# Patient Record
Sex: Male | Born: 1935
Health system: Southern US, Community
[De-identification: ages and names within clinical notes are randomized; demographics above are authoritative.]

## PROBLEM LIST (undated history)

## (undated) DIAGNOSIS — I2089 Other forms of angina pectoris: Secondary | ICD-10-CM

## (undated) DIAGNOSIS — F329 Major depressive disorder, single episode, unspecified: Secondary | ICD-10-CM

## (undated) DIAGNOSIS — D62 Acute posthemorrhagic anemia: Secondary | ICD-10-CM

## (undated) DIAGNOSIS — T4145XA Adverse effect of unspecified anesthetic, initial encounter: Secondary | ICD-10-CM

## (undated) DIAGNOSIS — I639 Cerebral infarction, unspecified: Secondary | ICD-10-CM

## (undated) DIAGNOSIS — I749 Embolism and thrombosis of unspecified artery: Secondary | ICD-10-CM

## (undated) DIAGNOSIS — M1990 Unspecified osteoarthritis, unspecified site: Secondary | ICD-10-CM

## (undated) DIAGNOSIS — K219 Gastro-esophageal reflux disease without esophagitis: Secondary | ICD-10-CM

## (undated) DIAGNOSIS — Z8489 Family history of other specified conditions: Secondary | ICD-10-CM

## (undated) DIAGNOSIS — S065X9A Traumatic subdural hemorrhage with loss of consciousness of unspecified duration, initial encounter: Secondary | ICD-10-CM

## (undated) DIAGNOSIS — F419 Anxiety disorder, unspecified: Secondary | ICD-10-CM

## (undated) DIAGNOSIS — I219 Acute myocardial infarction, unspecified: Secondary | ICD-10-CM

## (undated) DIAGNOSIS — I208 Other forms of angina pectoris: Secondary | ICD-10-CM

## (undated) DIAGNOSIS — K759 Inflammatory liver disease, unspecified: Secondary | ICD-10-CM

## (undated) DIAGNOSIS — M069 Rheumatoid arthritis, unspecified: Secondary | ICD-10-CM

## (undated) DIAGNOSIS — T8859XA Other complications of anesthesia, initial encounter: Secondary | ICD-10-CM

## (undated) DIAGNOSIS — K2210 Ulcer of esophagus without bleeding: Secondary | ICD-10-CM

## (undated) DIAGNOSIS — N400 Enlarged prostate without lower urinary tract symptoms: Secondary | ICD-10-CM

## (undated) DIAGNOSIS — R51 Headache: Secondary | ICD-10-CM

## (undated) DIAGNOSIS — E785 Hyperlipidemia, unspecified: Secondary | ICD-10-CM

## (undated) DIAGNOSIS — I739 Peripheral vascular disease, unspecified: Secondary | ICD-10-CM

## (undated) DIAGNOSIS — I10 Essential (primary) hypertension: Secondary | ICD-10-CM

## (undated) DIAGNOSIS — J189 Pneumonia, unspecified organism: Secondary | ICD-10-CM

## (undated) DIAGNOSIS — F32A Depression, unspecified: Secondary | ICD-10-CM

## (undated) DIAGNOSIS — G8929 Other chronic pain: Secondary | ICD-10-CM

## (undated) DIAGNOSIS — L03112 Cellulitis of left axilla: Secondary | ICD-10-CM

## (undated) DIAGNOSIS — I959 Hypotension, unspecified: Secondary | ICD-10-CM

## (undated) DIAGNOSIS — I2510 Atherosclerotic heart disease of native coronary artery without angina pectoris: Secondary | ICD-10-CM

## (undated) DIAGNOSIS — R519 Headache, unspecified: Secondary | ICD-10-CM

## (undated) DIAGNOSIS — C3491 Malignant neoplasm of unspecified part of right bronchus or lung: Secondary | ICD-10-CM

## (undated) DIAGNOSIS — M545 Low back pain, unspecified: Secondary | ICD-10-CM

## (undated) DIAGNOSIS — S065XAA Traumatic subdural hemorrhage with loss of consciousness status unknown, initial encounter: Secondary | ICD-10-CM

## (undated) DIAGNOSIS — Z8679 Personal history of other diseases of the circulatory system: Secondary | ICD-10-CM

## (undated) HISTORY — DX: Other forms of angina pectoris: I20.89

## (undated) HISTORY — DX: Hemochromatosis, unspecified: E83.119

## (undated) HISTORY — DX: Anxiety disorder, unspecified: F41.9

## (undated) HISTORY — DX: Other forms of angina pectoris: I20.8

## (undated) HISTORY — PX: NASAL SINUS SURGERY: SHX719

## (undated) HISTORY — PX: INGUINAL HERNIA REPAIR: SUR1180

## (undated) HISTORY — PX: CORONARY ANGIOPLASTY WITH STENT PLACEMENT: SHX49

## (undated) HISTORY — DX: Unspecified osteoarthritis, unspecified site: M19.90

## (undated) HISTORY — DX: Personal history of other diseases of the circulatory system: Z86.79

## (undated) HISTORY — PX: TOTAL KNEE ARTHROPLASTY: SHX125

## (undated) HISTORY — DX: Traumatic subdural hemorrhage with loss of consciousness of unspecified duration, initial encounter: S06.5X9A

## (undated) HISTORY — DX: Ulcer of esophagus without bleeding: K22.10

## (undated) HISTORY — DX: Traumatic subdural hemorrhage with loss of consciousness status unknown, initial encounter: S06.5XAA

## (undated) HISTORY — PX: DESCENDING AORTIC ANEURYSM REPAIR W/ STENT: SHX1456

## (undated) HISTORY — DX: Hyperlipidemia, unspecified: E78.5

## (undated) HISTORY — DX: Atherosclerotic heart disease of native coronary artery without angina pectoris: I25.10

## (undated) HISTORY — DX: Peripheral vascular disease, unspecified: I73.9

## (undated) HISTORY — DX: Benign prostatic hyperplasia without lower urinary tract symptoms: N40.0

## (undated) HISTORY — PX: CHOLECYSTECTOMY OPEN: SUR202

## (undated) HISTORY — PX: JOINT REPLACEMENT: SHX530

## (undated) HISTORY — PX: SHOULDER OPEN ROTATOR CUFF REPAIR: SHX2407

## (undated) HISTORY — DX: Gastro-esophageal reflux disease without esophagitis: K21.9

## (undated) HISTORY — PX: CATARACT EXTRACTION W/ INTRAOCULAR LENS  IMPLANT, BILATERAL: SHX1307

## (undated) HISTORY — DX: Essential (primary) hypertension: I10

## (undated) HISTORY — DX: Malignant neoplasm of unspecified part of right bronchus or lung: C34.91

## (undated) HISTORY — PX: CORNEAL TRANSPLANT: SHX108

---

## 1997-04-04 HISTORY — PX: CORONARY ARTERY BYPASS GRAFT: SHX141

## 1997-11-19 ENCOUNTER — Inpatient Hospital Stay (HOSPITAL_COMMUNITY): Admission: AD | Admit: 1997-11-19 | Discharge: 1997-11-21 | Payer: Self-pay | Admitting: *Deleted

## 1997-11-19 ENCOUNTER — Encounter: Payer: Self-pay | Admitting: *Deleted

## 1999-12-29 ENCOUNTER — Encounter: Payer: Self-pay | Admitting: Emergency Medicine

## 1999-12-29 ENCOUNTER — Inpatient Hospital Stay (HOSPITAL_COMMUNITY): Admission: EM | Admit: 1999-12-29 | Discharge: 1999-12-31 | Payer: Self-pay | Admitting: Emergency Medicine

## 1999-12-30 ENCOUNTER — Encounter: Payer: Self-pay | Admitting: Cardiology

## 1999-12-31 ENCOUNTER — Encounter: Payer: Self-pay | Admitting: Cardiology

## 2000-03-06 ENCOUNTER — Inpatient Hospital Stay (HOSPITAL_COMMUNITY): Admission: RE | Admit: 2000-03-06 | Discharge: 2000-03-11 | Payer: Self-pay | Admitting: Orthopedic Surgery

## 2000-11-08 ENCOUNTER — Encounter: Admission: RE | Admit: 2000-11-08 | Discharge: 2000-11-08 | Payer: Self-pay | Admitting: Family Medicine

## 2000-11-08 ENCOUNTER — Encounter: Payer: Self-pay | Admitting: Family Medicine

## 2001-05-09 ENCOUNTER — Encounter: Payer: Self-pay | Admitting: Emergency Medicine

## 2001-05-09 ENCOUNTER — Inpatient Hospital Stay (HOSPITAL_COMMUNITY): Admission: EM | Admit: 2001-05-09 | Discharge: 2001-05-11 | Payer: Self-pay | Admitting: Emergency Medicine

## 2003-10-20 ENCOUNTER — Inpatient Hospital Stay (HOSPITAL_COMMUNITY): Admission: RE | Admit: 2003-10-20 | Discharge: 2003-10-22 | Payer: Self-pay | Admitting: Orthopedic Surgery

## 2004-02-09 ENCOUNTER — Emergency Department (HOSPITAL_COMMUNITY): Admission: EM | Admit: 2004-02-09 | Discharge: 2004-02-09 | Payer: Self-pay | Admitting: Family Medicine

## 2004-02-13 ENCOUNTER — Emergency Department (HOSPITAL_COMMUNITY): Admission: EM | Admit: 2004-02-13 | Discharge: 2004-02-13 | Payer: Self-pay | Admitting: Family Medicine

## 2004-02-16 ENCOUNTER — Emergency Department (HOSPITAL_COMMUNITY): Admission: EM | Admit: 2004-02-16 | Discharge: 2004-02-16 | Payer: Self-pay | Admitting: Family Medicine

## 2004-03-04 LAB — HM COLONOSCOPY

## 2004-05-20 ENCOUNTER — Other Ambulatory Visit: Admission: RE | Admit: 2004-05-20 | Discharge: 2004-05-20 | Payer: Self-pay | Admitting: Dermatology

## 2004-06-21 ENCOUNTER — Other Ambulatory Visit: Admission: RE | Admit: 2004-06-21 | Discharge: 2004-06-21 | Payer: Self-pay | Admitting: Dermatology

## 2005-03-29 ENCOUNTER — Ambulatory Visit (HOSPITAL_COMMUNITY)
Admission: RE | Admit: 2005-03-29 | Discharge: 2005-03-29 | Payer: Self-pay | Admitting: Physical Medicine & Rehabilitation

## 2005-05-03 ENCOUNTER — Inpatient Hospital Stay (HOSPITAL_COMMUNITY): Admission: EM | Admit: 2005-05-03 | Discharge: 2005-05-10 | Payer: Self-pay | Admitting: Emergency Medicine

## 2005-05-04 ENCOUNTER — Ambulatory Visit: Payer: Self-pay | Admitting: Pulmonary Disease

## 2005-05-07 ENCOUNTER — Encounter (INDEPENDENT_AMBULATORY_CARE_PROVIDER_SITE_OTHER): Payer: Self-pay | Admitting: Specialist

## 2005-08-03 ENCOUNTER — Ambulatory Visit (HOSPITAL_COMMUNITY): Admission: RE | Admit: 2005-08-03 | Discharge: 2005-08-03 | Payer: Self-pay | Admitting: Family Medicine

## 2006-02-01 ENCOUNTER — Inpatient Hospital Stay (HOSPITAL_COMMUNITY): Admission: RE | Admit: 2006-02-01 | Discharge: 2006-02-07 | Payer: Self-pay | Admitting: Orthopedic Surgery

## 2007-02-16 ENCOUNTER — Observation Stay (HOSPITAL_COMMUNITY): Admission: RE | Admit: 2007-02-16 | Discharge: 2007-02-17 | Payer: Self-pay | Admitting: Cardiology

## 2007-05-11 ENCOUNTER — Ambulatory Visit (HOSPITAL_COMMUNITY): Admission: RE | Admit: 2007-05-11 | Discharge: 2007-05-11 | Payer: Self-pay | Admitting: Cardiology

## 2009-06-16 ENCOUNTER — Ambulatory Visit: Payer: Self-pay

## 2009-06-16 ENCOUNTER — Encounter: Payer: Self-pay | Admitting: Cardiovascular Disease

## 2009-06-16 DIAGNOSIS — I739 Peripheral vascular disease, unspecified: Secondary | ICD-10-CM | POA: Insufficient documentation

## 2009-06-17 ENCOUNTER — Telehealth (INDEPENDENT_AMBULATORY_CARE_PROVIDER_SITE_OTHER): Payer: Self-pay | Admitting: *Deleted

## 2009-09-18 ENCOUNTER — Encounter: Admission: RE | Admit: 2009-09-18 | Discharge: 2009-09-18 | Payer: Self-pay | Admitting: Cardiology

## 2009-09-21 ENCOUNTER — Encounter: Admission: RE | Admit: 2009-09-21 | Discharge: 2009-09-21 | Payer: Self-pay | Admitting: Family Medicine

## 2010-03-22 ENCOUNTER — Ambulatory Visit: Payer: Self-pay | Admitting: Internal Medicine

## 2010-03-25 ENCOUNTER — Ambulatory Visit: Payer: Self-pay | Admitting: Internal Medicine

## 2010-03-26 ENCOUNTER — Ambulatory Visit: Payer: Self-pay

## 2010-03-30 ENCOUNTER — Ambulatory Visit: Payer: Self-pay

## 2010-05-04 NOTE — Progress Notes (Signed)
  Faxed Doppler over to [facility] @ [facility]. to fax [phone] [facility]  June 17, 2009 3:08 PM

## 2010-05-04 NOTE — Miscellaneous (Signed)
Summary: Orders Update  Clinical Lists Changes  Problems: Added new problem of UNSPECIFIED PERIPHERAL VASCULAR DISEASE (ICD-443.9) Orders: Added new Test order of Arterial Duplex Lower Extremity (Arterial Duplex Low) - Signed 

## 2010-08-17 NOTE — Cardiovascular Report (Signed)
NAME:  Evan [name], Evan [name] NO.:  [id number]   MEDICAL RECORD NO.:  [id number]          PATIENT TYPE:  OIB   LOCATION:  2899                         FACILITY:  [facility]   PHYSICIAN:  Francisca December, M.D.  DATE OF BIRTH:  16-Oct-1927   DATE OF PROCEDURE:  05/11/2007  DATE OF DISCHARGE:  05/11/2007                            CARDIAC CATHETERIZATION   PROCEDURES PERFORMED:  1. Left heart catheterization.  2. Left ventriculography.  3. Coronary angiography.  4. Arterial conduit angiography both right radial free graft to OM and      LIMA graft.   INDICATION:  [name] is a 77 year old man with extensive history  of ASCVD.  He is status post a remote CABG which included a right radial  free graft to the OM and a LIMA to the LAD.  He underwent stent  implantation in an occluded circumflex which extended into two large  obtuse marginals in November 2008.  He also had a stent implantation of  the ostium of the right radial free graft.  These were both drug-eluting  stents.  He has had persistent chest pain in an atypical anginal  fashion.  Because of the persistence of his symptoms he is returned to  the catheterization laboratory at this time to identify possible early  stent failure or other progressive disease.   PROCEDURAL NOTE:  The patient is brought to the cardiac catheterization  laboratory in the fasting state.  The right groin was prepped and draped  in the usual sterile fashion.  Local anesthesia was obtained with  infiltration with 1% lidocaine.  A 5-French catheter sheath was inserted  percutaneously into the right femoral artery utilizing an anterior  approach over a guiding J-wire.  Left heart catheterization and coronary  angiography then proceeded in the standard fashion using 5-French #4  left and right Judkins catheters and a 110-cm pigtail catheter.  A 30-  degree RAO cine left ventriculogram was performed utilizing a power  injector; 42 mL were  injected at 13 mL per second.  The right coronary  catheter was used to perform cineangiography of the right radial free  graft to the obtuse marginal.  It was then used to manipulate into the  left subclavian artery.  A LIMA catheter was then placed over a long  guiding J-wire.  Cineangiography of the LIMA to the LAD was conducted in  LAO and RAO projections.  At the completion of the procedure the  catheter and catheter sheath were removed.  Hemostasis was achieved by  direct pressure.  The patient was transported to the recovery area in  stable condition with an intact distal pulse.   HEMODYNAMIC RESULTS:  Systemic arterial pressure was 134/71 with a mean  of 96 mmHg.  There was no systolic gradient across the aortic valve.  The left ventricular end-diastolic pressure was 6 mmHg pre  ventriculogram.   ANGIOGRAPHY:  The left ventriculogram demonstrated normal chamber size  and normal global systolic function.  There is actually hyperdynamic  systolic function with a visually estimated ejection fraction of 75-80%.  There  is diastolic mitral regurgitation noted.  There is a normal  trileaflet aortic valve.   There is a right-dominant coronary system present.  The main left  coronary artery is short and normal.   The left anterior descending artery is completely occluded at its  origin.   The left circumflex coronary artery gives rise to a small first marginal  branch and then there is the stented segment in the mid portion which is  widely patent.  The mid to distal portion of the left circumflex gives  rise to a large obtuse marginal that has a 30-40% stenosis at its  origin.  The circumflex then goes on to give rise to two very small  posterobasal branches.  There are no significant obstructions seen  within the circumflex at its distal marginal branches.  The first  marginal does have a diffuse 80% stenosis and retrograde flow into the  graft is noted.   The right coronary  artery is large and dominant.  There are luminal  irregularities and a tubular 20-30% stenosis is seen in the mid portion.  The distal vessel gives rise to a large posterior descending artery and  a moderate-sized posterolateral segment with a single left ventricular  branch which is moderate in size.  There are no obstructions in the  distal right coronary artery or its distal branches.   The right radial free graft to the marginal is widely patent including  its ostium which is the stented segment.  It inserts upon the first  marginal branch as noted above.  Provides retrograde and antegrade  filling.  Antegrade filling shows no obstruction.  Retrograde filling  shows the previously mentioned diffuse 80% stenosis.  There are some  luminal irregularities in the graft itself, but no significant  obstructions.   The LIMA graft to the LAD is widely patent and inserts upon the mid  portion of the anterior descending artery.  It provides retrograde and  antegrade filling.  Retrograde filling approaches the left main and  fills three septal perforators, the first and second of which are very  small, the third of which is moderate in size.  I can see no  obstructions in the retrograde portion that are of significance.  The  antegrade portion bifurcates on the mid to distal portion of the  anterior wall and both branches reach but do not quite traverse the  apex.  There are no obstructions in the ongoing or antegrade portion of  the left anterior descending.   Collateral vessels are not seen.   FINAL IMPRESSION:  1. Atherosclerotic coronary vascular disease, two-vessel.  2. Hyperdynamic left ventricular systolic function, ejection fraction      75-80%.  3. Widely patent stented sites in the left circumflex and ostium of      the right radial free graft to the obtuse marginal.  4. No clear coronary angiographic etiology for the patient's chest      pain.   PLAN:  Continue medical  therapy      Francisca December, M.D.  Electronically Signed     JHE/MEDQ  D:  05/11/2007  T:  05/13/2007  Job:  [id number]   cc:   Ernestina Penna, M.D.

## 2010-08-17 NOTE — Cardiovascular Report (Signed)
NAME:  Evan, [name] NO.:  [id number]   MEDICAL RECORD NO.:  [id number]          PATIENT TYPE:  OIB   LOCATION:  6531                         FACILITY:  [facility]   PHYSICIAN:  Francisca December, M.D.  DATE OF BIRTH:  08-14-1927   DATE OF PROCEDURE:  DATE OF DISCHARGE:                            CARDIAC CATHETERIZATION   PROCEDURE PERFORMED:  1. PCI/drug-eluting stent implantation, circumflex marginal.  2. PCI/drug-eluting stent implantation, ostium, right radial free      graft to OM1   INDICATIONS:  [name] is a 77 year old man who is now  approximately 10 years s/p CABG.  He has developed a recent atypical  anginal syndrome.  Myocardial perfusion study showed a significant  reversible inferolateral defect.  He has undergone coronary angiography  revealing a completely occluded second marginal branch which is new  since angiography performed in 2007.  As well, there is a significant or  subtotal stenosis at the origin of the right radial free graft to the  marginal branch #1.  He is to undergo catheter-based revascularization  at this time.   PROCEDURAL NOTE:  The patient is brought to the cardiac catheterization  lab during the fasting state.  The right groin was prepped and draped in  the usual sterile fashion.  Local anesthesia was obtained with  infiltration of 1% lidocaine; a 7 French catheter sheath was inserted  percutaneously into the right femoral artery utilizing an anterior  approach over guiding J wire; a 7 [place] #3.5 and then a 4.0 Voda left  guiding catheters were advanced to the ascending aorta but these did not  seat well in the left main.  I could in fact not cannulate the left main  with the Voda #4.  Therefore, a 3.5 CLS guiding catheter was advanced  where the left coronary os was adequately engaged.  The patient received  1.25 mg/kg of Bivalirudin as a bolus done in a constant infusion at 0.75  mg/kg per hour.  The resultant ACT was  354 seconds.  He had previously  been pretreated with Plavix; a 0.014-inch ASAHI medium wire was then  used to cross the complete occlusion in the second marginal branch; a  2.0/20 mm Maverick intracoronary balloon was then advanced across the  lesion and inflated 6 atmospheres for 120 seconds.  This did reestablish  antegrade flow in the vessel.  The Maverick balloon was removed and a 3  0/28 mm SCIMED Taxus Express 2 intracoronary drug-eluting stent was then  advanced carefully into place within this marginal branch, carefully  positioned, and deployed at a peak pressure of 14 atmospheres for not  greater than 35 seconds.  The stent balloon was deflated and removed,  and post dilatation performed with a 3.25/20--mm Quantum Maverick  intracoronary balloon.  This was placed in three different positions  within the stented segment and inflated to a peak pressure of 18  atmospheres for not greater than 40 seconds.  These maneuvers resulted  in wide patency as confirmed in orthogonal views, both with and without  the guidewire in place.  The guiding catheter was then removed and it  was replaced with a 6 French LCB guiding catheter.  The ostium of the  right radial free graft was cannulated, and the occlusion easily crossed  with a 0.014-inche Luge intracoronary guidewire.  Initial balloon  dilatation was performed with a 2.515 mm Scimed Maverick intracoronary  balloon.  This was placed across the lesion and inflated to 6  atmospheres for 20 seconds.  It was deflated and removed, and a 2.75/12-  mm SCIMED Taxus Express 2 intracoronary drug-eluting stent was then  advanced into place and carefully positioned in the ostium of the radial  free graft.  It was subsequently deployed at peak pressure of 12  atmospheres for 62 seconds.  The stent balloon was deflated and removed,  and a wide patency was confirmed in the ostium.  However, there was some  spasm at the distal end of the stented segment.   It was resistant to  i.c. nitroglycerin and i.c. verapamil.  Therefore, the 2.5/15-mm SCIMED  Maverick was advanced across this region just at the distal edge of the  stent and inflated to not greater than 4 atmospheres for 31 seconds.  It  was deflated and removed, and wide patency was achieved throughout the  stented segment and into the graft itself.  This was confirmed in  orthogonal views, both with and without the guidewire in place.  The  guiding catheter was then removed.  The sheath was sutured into place,  and the patient was transported to the recovery area in stable  condition.   ANGIOGRAPHY:  As mentioned, the initial lesion treated within the  circumflex extending into the second marginal branch.  It was completely  occluded and greater than 20 mm in length.  Following balloon dilatation  and stent implantation, there was no residual stenosis.  The second  lesion treated, as mentioned, was in the ostium of the right radial free  graft to marginal #1.  It was 90-95% stenotic and, following balloon  dilatation and stent implantation, there was again no residual stenosis.   FINAL IMPRESSION:  1. Atherosclerotic cardiovascular disease, three-vessel.  2. Status post successful PCI/drug-eluting stent implantation, second      marginal branch complete occlusion, and ostium of the right radial      free graft to first marginal branch.  3. Typical angina was not reproduced with device insertion and balloon      inflation      Francisca December, M.D.  Electronically Signed     JHE/MEDQ  D:  02/16/2007  T:  02/17/2007  Job:  [id number]   cc:   Ernestina Penna, M.D.

## 2010-08-20 NOTE — H&P (Signed)
[place]  Patient:    ELDRIGE, PITKIN                         MRN: [id number] Adm. Date:  [id number] Attending:  Corliss Marcus CC:         Tomi Bamberger, Olena Leatherwood  Francisca December, M.D.   History and Physical  CHIEF COMPLAINT:  Chest pain.  HISTORY:  This is a 77 year old married Caucasian gentleman admitted with left chest discomfort.  The pain has been intermittent for several days and worse today.  He has also had an upper respiratory infection for the past several days and has been under treatment by Tomi Bamberger at [facility].  Yesterday he was seen there and was given an intramuscular injection of an antibiotic and today he went back, and was given a prescription for Biaxin and he took his first dose today.  Of note is the fact that the patient does have known coronary artery disease.  He had coronary artery bypass graft surgery in [facility] in 1998.  He was admitted to [facility] in August 1999 with atypical chest pain and had cardiac catheterization.  The ejection fraction was normal.  The LAD was occluded at the ostium.  The left circumflex had a 80-90% narrowing of the obtuse marginal-1 and the LIMA to the LAD was widely patent.  The graft to the obtuse marginal-1 was patent, but with a 30-40% ostial obstruction, but the branch of the OM-2 was not felt.  The patient was discharged on medication and Imdur and Norvasc were added to his previous medication, which was atenolol.  An outpatient nuclear stress test was to have been performed; results not available.  The patient developed increased chest pain today and became concerned, and called EMS who transported him to [facility]  MEDICATIONS:  His present medications are Biaxin 500 mg 2 daily, Norvasc 5 mg daily, Ambien 10 mg H.S. p.r.n. for sleep, atenolol 50 mg daily, Ecotrin 81 mg daily, Ultram 50 mg q-8-h p.r.n. for pain, Imdur 30 mg daily, Lipitor 10  mg daily, and Voltaren XR 100 mg daily.  FAMILY HISTORY:  Strongly positive for coronary disease.  He had one brother who died at age 75 of a heart attack and one brother died after coronary bypass surgery.  He has three sisters, two of whom have had coronary disease. High blood pressure also runs in the family.  The patients father died of a heart attack at age 3 and mother died at age 13 of a heart attack.  SOCIAL HISTORY:  He is married.  He is retired from [employer].   He has five children; living and well.  He is a nonsmoker.  He has had no alcohol for 20 years.  ALLERGIES:  He is allergic to [facility].  REVIEW OF SYSTEMS:  He has not been experiencing any recent gastrointestinal problems.  He has had a recent respiratory cough.  He does have chronic nocturia once or twice a night.  He is not diabetic.  He does have orthopedic problems with arthritis of the right knee and has been advised by Dr. Trellis Paganini to have total knee replacement.  PHYSICAL EXAMINATION:  VITAL SIGNS:  On physical examination blood pressure is 122/67, pulse is 64 and regular and respirations are normal.  HEENT:  Unremarkable.  NECK:  Carotids are normal.  Jugular venous pressure normal.  CHEST:  Clear to auscultation without  rales.  HEART:  Normal first and second heart sounds.  There is no S3 or S4 gallop. There is no murmur, click or rub.  ABDOMEN:  Soft and nontender.  EXTREMITIES:  Show pedal pulses present.  No phlebitis or edema.  LABORATORY DATA:  The EKG shows normal sinus rhythm with no acute change.  Chest x-ray shows borderline cardiomegaly and possible early vascular congestion, but no pneumonia.  Laboratory studies show a white count of 11,300 and hemoglobin 12.7.  CK MB 2.1.  Troponin 0.04.  DIAGNOSTIC IMPRESSION: 1. Chest pain, rule out myocardial infarction. 2. Status post coronary artery bypass graft. 3. Recent upper respiratory infection, presently on Biaxin. 4. History of  hyperlipidemia on Lipitor.  DISPOSITION:  We are going to admit to a monitored bed.  Begin an IV heparin drip per pharmacy protocol.  We will hold his breakfast in the A.M. and consider adenosine Cardiolite stress test or possible cardiac cath. Dr. Amil Amen will see patient in A.M. DD:  12/29/99 TD:  12/30/99 Job: [id number]

## 2010-08-20 NOTE — H&P (Signed)
NAME:  Evan [name], Evan [name] NO.:  [id number]   MEDICAL RECORD NO.:  [id number]                   PATIENT TYPE:  INP   LOCATION:  NA                                   FACILITY:  [facility]   PHYSICIAN:  Maisie Fus B. Dixon, P.A.               DATE OF BIRTH:  1927-05-14   DATE OF ADMISSION:  DATE OF DISCHARGE:                                HISTORY & PHYSICAL   HISTORY OF PRESENT ILLNESS:  [name] is a male coming in complaining about  right shoulder pain that has been going on ever since May 2005, after he  sustained a fall and an injury to his right shoulder.  The patient had been  seen in our office x2 with a MRI showing a large rotator cuff tear and also  some mild degeneration of his AC joint.  Thus, he has elected to have a  rotator cuff repair and also an open DCR.   PAST MEDICAL HISTORY:  1. Coronary artery disease.  2. GERD.  3. Hypertension.   ALLERGIES:  FELDENE.   MEDICATIONS:  1. Darvocet-N 100 q.4-6h. p.r.n. pain.  2. Plavix 75 mg daily.  3. Atenolol 50 mg daily.  4. Altace 5 mg daily.  5. Wellbutrin XL 150 mg daily.  6. Aspirin 81 mg daily.  7. Prevacid 30 mg daily.  8. Lipitor 10 mg daily.  9. Clonazepam 0.5 mg t.i.d.  10.      Isosorbide mononitrate 60 mg 1-1/2 tabs daily.  11.      Diclofenac 50 mg t.i.d.   SOCIAL HISTORY:  The patient does not smoke.  He does not use any alcohol.  He is retired.  His primary care physician is Dr. Saul Fordyce.   PHYSICAL EXAMINATION:  VITAL SIGNS:  Pulse is 80 beats per minute,  respirations 18, blood pressure is 130/80.  GENERAL APPEARANCE:  The patient is a healthy-appearing male in no acute  distress.  Pleasant mood and affect.  Alert and oriented x3.  NECK AND HEAD:  Cranial nerves II-XII grossly intact.  He has full range of  motion without any difficulty.  He does have some mild tenderness over the  paraspinous muscles of C6 and C7, but no tenderness over on the left-hand  side.  Spurling's  test and ______________ test are negative x2.  CHEST:  Bilateral lung sounds are clear and equal.  No wheezes, rhonchi, or  rales noted.  HEART:  Regular rate and rhythm.  No murmur or gallop.  No S3 or S4.  ABDOMEN:  Bowel sounds are active in all four quadrants, nontender,  nondistended, no pulsatile masses noted.  EXTREMITIES:  Strength is 5/5 bilaterally with grip strengths, biceps, and  triceps, but strength of the internal and external rotators of the right  shoulder are 3/5.  Strength of quadriceps, hamstrings, plantar, and  dorsiflexion are all 5/5 bilaterally.  SKIN:  No rashes are noted.  NEUROLOGIC:  Intact bilateral upper and lower extremities.  Reflexes are 2+  and equal bilaterally.   LABORATORY DATA:  MRI shows a complete tear of the supraspinatus and rotator  cuff.  Plain x-ray showed some mild AC degeneration.   IMPRESSION:  Right rotator cuff tear.   PLAN:  Right shoulder arthroscopy, arthroscopy assisted subacromial  decompression, miny open rotator cuff repair, and open distal clavicle  resection.  This will be completed on October 20, 2003, at [facility].  Attending surgeon will be Dr. Malon Kindle.                                                Thomas B. Ferne Coe.    TBD/MEDQ  D:  10/07/2003  T:  10/07/2003  Job:  [id number]

## 2010-08-20 NOTE — Op Note (Signed)
NAME:  Evan [name], CANNER NO.:  [id number]   MEDICAL RECORD NO.:  [id number]                   PATIENT TYPE:  INP   LOCATION:  5029                                 FACILITY:  [facility]   PHYSICIAN:  Almedia Balls. Ranell Patrick, M.D.              DATE OF BIRTH:  25-Sep-1927   DATE OF PROCEDURE:  10/20/2003  DATE OF DISCHARGE:  10/22/2003                                 OPERATIVE REPORT   PREOPERATIVE DIAGNOSIS:  Right shoulder rotator cuff tear and  acromioclavicular joint arthritis.   POSTOPERATIVE DIAGNOSIS:  Right shoulder rotator cuff tear, massive and  unrepairable, chronic impingement, superior labral tear anterior posterior  with unstable biceps anchor, and acromioclavicular joint arthritis.   OPERATION PERFORMED:  Right shoulder arthroscopy with extensive intra-  articular debridement of rotator cuff tear of superior labral tear, anterior  posterior as well as arthroscopic biceps tenotomy followed by arthroscopic  subacromial decompression, open biceps tenodesis in the bicipital groove and  open distal clavicle excision.   SURGEON:  Almedia Balls. Ranell Patrick, M.D.   ASSISTANT:  Donnie Coffin. Durwin Nora, P.A.   ANESTHESIA:  General.   ESTIMATED BLOOD LOSS:  Minimal.   FLUIDS REPLACED:  1200 mL crystalloid.   INSTRUMENT COUNT:  Correct.   COMPLICATIONS:  None.   ANTIBIOTICS:  Perioperative antibiotics given.   INDICATIONS FOR PROCEDURE:  The patient is a 77 year old male who presents  complaining of right shoulder pain. The patient fell recently, complained of  severe pain in the shoulder, decreased function, decreased range of motion.  The patient presented to orthopedics where evaluation demonstrated a large  retracted rotator cuff tear.  Concern was whether this was acute or chronic.  Patient's pain, however, was greatly different from what it had been pre-  injury and based on his decline in function and his desire to return to  functional activities.  The patient  elected to proceed with surgical  treatment with debridement or repair of the rotator cuff as well as  addressing acromioclavicular joint arthritis noted on preoperative work-up.  Informed consent was obtained.  The patient was cleared preoperatively  medically and scheduled for surgery at [facility].   DESCRIPTION OF PROCEDURE:  After an adequate level of anesthesia was  achieved and preoperative antibiotics were given, the patient was positioned  in modified beach chair position.  The right shoulder was examined under  anesthesia and there was noted to be forward elevation stiffness indicating  chronicity in the patient's injury.  Forward elevation limited to about 140  degrees.  Abduction 90 degrees, external rotation  was 45 to 50 degrees,  internal rotation 30.  A very gentle manipulation was attempted; however,  this did not yield any significant improvement in the patient's forward  elevation and this was felt to be chronic capsular tightness and the  concern, with the patient 81, was for possible fracture if progressed at  all.  We elected to proceed with arthroscopy at this point. After sterile  prep and drape of the shoulder, standard arthroscopic portals were utilized,  anterolateral and posterior portals.  Infiltration of the skin with 0.25%  Marcaine with epinephrine followed by 10-11 blade scalpel introduction  cannula to joint using blunt obturators.  Diagnostic arthroscopy revealed  significant anterior and superior labral fraying extending to the 11 o'clock  position on the right shoulder posteriorly.  Once the loose labral tissue  was debrided, there was noted to be significant instability in the biceps  anchor as well as splitting and fraying of the biceps tendon.  Based on the  condition of the anchor and the biceps, it was decided to perform a biceps  tenotomy to prevent chronic pain from this location.  There was also noted  to be a significantly retracted  rotator cuff tear, cuff grasper was placed  through the lateral portal and the cuff was mobilized to the mid joint but  not nearly to the level of the rotator cuff footprint.  The changes on the  rotator cuff footprint including some sclerosis and bony prominences  indicate a chronicity of this lesion.  We did attempt a mobilization  including freeing the cuff up arthroscopically from the bursal and joint  surface off the top of the glenoid and again grasping that and bringing it  over and abducting the arm to relocate this to the footprint.  At this point  the subscap was inspected and noted to be in good condition.  Anterior  inferior capsule appeared intact.  Glenohumeral articular cartilage had some  mild chondromalacia type changes but otherwise in good shape.  Scope was  placed in the subacromial space which was contiguous with the shoulder joint  and there was noted to be some significant subacromial spur formation and  this was debrided using a full radius resector and ArthroCare wand.  Special  care was taken to preserve the coracoacromial ligament.  This was easily  identifiable, quite shiny and in good condition.  This was independent of  the anterior spur which we were able to remove without compromising the CA  ligament insertion on the acromion.  A very limited acromioplasty was  performed to ensure that the CA ligament and inferior deltoid fascia was  intact.  We were able to smooth the CA arch significantly from its  presurgical condition.  We also smoothed down the greater tuberosity in  anticipation of the cuff deficient shoulder articulation with the acromion  __________  impingement.  At this juncture, the scope was concluded.  A  small mini open incision was created overlying the bicipital groove  facilitate biceps tenodesis.  Once this was performed, using a 35 Panalok  anchor into the bicipital groove, we once again attempted to identify the rotator cuff and see if we  could grab that and pull it out laterally.  We  could find the cuff, place a couple of sutures and again this could not  mobilize nearly far enough and was several centimeters short, appeared  chronic in nature and the  tissue was not good.  Thus our findings  arthroscopically were confirmed with smaller incision for the tenodesis.  Additional small saber incision was created overlying the acromioclavicular  joint to facilitate distal clavicle excision.  This was done using a 10  blade scalpel.  Dissection sharply through subcutaneous tissue using Bovie  electrocautery.  Deltotrapezial fascia identified and incised in line with  the distal  clavicle and subperiosteal dissection of distal clavicle  performed.  Distal clavicle excision with oscillating saw.  1 cm of distal  clavicle removed.  Bone wax applied to the distal end of the clavicle.  Irrigation of the joints removed bony debris.  Removal of significant  synovitis and inflammatory tissue present at that location.  Closure of  deltotrapezial fascia using interrupted #1 Vicryl with buried knots followed  by 2-0 subcutaneous closure.  The anterior incision was closed in a layered  fashion as well.  Steri-Strips applied over a subcuticular stitch with  Monocryl and shoulder sling immobilizer.  The patient was then transferred  to the recovery room in stable condition having tolerated surgery well.                                               Almedia Balls. Ranell Patrick, M.D.    SRN/MEDQ  D:  10/23/2003  T:  10/23/2003  Job:  [id number]

## 2010-08-20 NOTE — Discharge Summary (Signed)
[place]  Patient:    KENNON, ENCINAS                         MRN: [id number] Adm. Date:  [id number] Disc. Date: [id number] Attending:  Corliss Marcus Dictator:   Anselm Lis, N.P. CC:         Tomi Bamberger, M.D., [facility]  Lum Babe, M.D.   Discharge Summary  PRIMARY CARE PHYSICIAN:  Tomi Bamberger, M.D., at [facility].  PROCEDURES: 1. On December 30, 1999, adenosine Cardiolite which was negative for ischemia    and scar.  Good ejection fraction with no regional wall motion abnormality. 2. Previous chest x-ray revealing peribronchial thickening and probable    bronchitis.  DISCHARGE DIAGNOSES: 1. Atypical chest pain; noncardiac in etiology.  The patient ruled out for    myocardial infarction by negative serial enzymes.  A follow-up adenosine    Cardiolite was negative for ischemia and infarction with good ejection    fraction and no regional wall motion abnormality.  Ejection fraction of    52%. 2. Febrile illness with leukocytosis with chest x-ray suggestive of    bronchitis; diarrhea which improved during the course of admission.  Peak    white blood cell count of 12.3 (from 11.3).  Temperature maximum of    100.5 degrees.  Afebrile at the time of discharge.  His admission    antibiotics were changed from Biaxin to Z-Pak, which he will finish in two    days.  He will be discharged to complete a five-day course of Z-Pak.  He    will call his primary care physician if no resolution of symptoms. 3. History of hypertension; good control on current medical regimen. 4. Dyslipidemia; on Lipitor management by primary care.  DISPOSITION:  The patient is discharged home in good condition.  DISCHARGE MEDICATIONS:  1. Zithromax (azithromycin) 250 mg tablet one p.o. q.d. x 3 more days.  2. Lipitor 10 mg p.o. q.d.  3. Voltaren XR 100 mg p.o. q.d.  4. Nitroglycerin sublingual spray one spray sublingual p.r.n. chest pain.  5. Norvasc 5 mg p.o.  q.d.  6. Ambien 10 mg p.o. q.h.s. p.r.n. insomnia.  7. Atenolol 50 mg one p.o. q.d.  8. Enteric-coated aspirin 325 mg once daily.  9. Ultram 50 mg q.8h. p.r.n. pain and discomfort. 10. Imdur 30 mg p.o. q.d.  DIET:  Low fat, low cholesterol, no added salt.  FOLLOW-UP:  Francisca December, M.D., on Thursday, January 20, 2000, at 9:30 a.m.  HOSPITAL COURSE:  (Please also see the dictated H&P.)  Mr. Mutch is a very pleasant 77 year old who is now two years status post CABG x 3.  A follow-up heart catheterization in August of 1999 revealed a patent LIMA to LAD, as well as patent radial artery grafts sequentially to the OM1 and OM2.  He had residual 90% large SP also at the LAD, 100% proximal native LAD, patent RCA, and 80% OM1.  He has had continuing episodes of atypical chest discomfort, left anterior, which was not particular exertional in nature and often relieved after taking Ultram.  He could have some improvement with nitrates as well.  Over the last one and a half weeks prior to admission, he had consolation symptoms, including fever/chills and diarrhea with temperatures up to 102 degrees.  He was taking up to 3 g of Tylenol a day.  He saw his primary M.D. prior to admission,  who administered IM antibiotics and also initiated Biaxin for a question of pneumonia.  On the day prior to admission, he had a prolonged episode of mild left anterior chest tightness which seemed relieved after taking two sublingual nitroglycerin.  He called his primary care Greene Diodato, who referred him to the emergency room.  For further details of admission, please see above.  PAST MEDICAL HISTORY: 1. Coronary atherosclerotic heart disease.    a. In June of 1998, CABG x 3 ([place]) with LIMA to       mid LAD, sequential radial artery to OM1 and OM2.    b. In August of 1999, cardiac catheterization by Francisca December, M.D.,       revealing patent left main, 100% proximal native LAD with 90%  origin of       the large septal perforator off of the LAD, 80% native OM1, and patent       RCA.  Bypass angiography revealed patent LIMA to LAD and patent       sequential radial artery to OM, though there was 30-40% ostial stenosis       of the radial artery at the ascending aorta.    c. In February of 2001, Cardiolite revealing an ejection fraction of 65%;       no ischemia, scar, nor regional wall motion abnormality. 2. Dyslipidemia. 3. Insomnia. 4. History of hypertension.  LABORATORY DATA:  Admission WBC of 11.3; subsequent measurement 12.4, hematocrit 35.7, platelets 248.  INR 1.0, pro time 13.1, PTT 30.  Sodium 134, K 4.8, chloride 105, CO2 23, glucose 110, BUN 20, creatinine 0.9.  LFTs within normal range, though elevated alkaline phosphatase at 201 (reference range 39-117), SGPT elevated at 53 (reference range 0-40).  Total protein, albumin, and calcium within normal range.  First CK of 152 with MB fraction 2.1 and troponin I 0.04.  Second CK 123 with MB of 17.  The UA was negative.  The admission chest x-ray revealed borderline cardiomegaly and possibly early vascular congestion, but no pneumonia.  A follow-up chest x-ray on December 30, 1999, revealed peribronchial thickening and question bronchitis. The EKG revealed normal sinus rhythm without acute changes. DD:  12/31/99 TD:  12/31/99 Job: [id number]

## 2010-08-20 NOTE — Op Note (Signed)
[facility]  Patient:    Evan [name], Evan [name]                         MRN: [id number] Proc. Date: 03/06/00 Adm. Date:  [id number] Disc. Date: [id number] Attending:  Corliss Marcus                           Operative Report  PREOPERATIVE DIAGNOSIS:  Osteoarthritis, right knee.  POSTOPERATIVE DIAGNOSIS:  Osteoarthritis, right knee.  PROCEDURE:  Right total knee arthroplasty.  SURGEON:  Ollen Gross, M.D.  ASSISTANT:  Alexzandrew L. Perkins, P.A.-C.  ANESTHESIA:  Spinal.  ESTIMATED BLOOD LOSS:  Minimal.  DRAIN:  Hemovac x 1.  TOURNIQUET TIME:  Forty-nine minutes at 350 mmHg.  COMPLICATIONS:  None.  CONDITION:  Stable, to recovery.  BRIEF CLINICAL NOTE:  Mr. Minniefield is a 77 year old male with significant osteoarthritis in the right knee with bone-on-bone changes medially.  He has had pain refractory to nonoperative management and presents now for total knee arthroplasty.  PROCEDURE IN DETAILS:  After the successful administration of spinal anesthetic, tourniquet is placed high on the right thigh and the right lower extremity prepped and draped in the usual sterile fashion.  The extremity then is wrapped in an Esmarch, knee flexed and tourniquet inflated to 350 mmHg.  A midline longitudinal incision is made and the skin cut with a 10 blade through subcutaneous tissue to the level of the extensor mechanism.  A fresh blade is used to make a medial parapatellar arthrotomy.  Soft tissue over the proximal and medial tibia is subperiosteally elevated to the joint line with a knife and to the semimembranosus bursa with a curved osteotome.  Soft tissue over the proximal and lateral tibia is also elevated, with attention being paid to avoid the patellar tendon on the tibial tubercle.  The lateral patellofemoral ligament is then cut, patella everted and knee flexed to 90 degrees.  ACL and PCL are then removed.  The drill was then used to create a hole in  the distal femur for the alignment rod.  The canal was irrigated, then the 5 degree right valgus alignment guide is placed and referencing off the posterior condyles, rotation was marked so as to remove 9 mm off the distal femur.  Distal femoral resection is made, then the sizing block is placed and size 4 is the most appropriate.  Rotation is marked with the epicondylar axis. The anteroposterior block is placed and the anterior and posterior cuts subsequently made.  Tibia was subluxated forward and the menisci removed.  The intramedullary tibial alignment guide is placed and referencing proximally at the medial aspect of the tibial tubercle and distally along the second metatarsal axis of the tibial crest, this block is subsequently pinned so as to remove 10 mm off the non-deficient lateral side.  Resection is then made with an oscillating saw.  Size 4 is the most appropriate tibial size.  The Chamfer block is then placed back on the femur and the intercondylar Chamfer cuts made; the size 4 posterior-stabilized femoral trial and the size 4 mobile bearing tibial trial are then placed.  The tibia is pinned and then the Keel punch and modular drill are utilized to prepare the proximal tibia and then a 10-mm rotating platform insert is placed and patient achieves full extension but has a real tiny amount of varus-valgus imbalance.  A 12.5-mm insert is place, he still  achieves full extension and has excellent balance at full extension, down past 100 degrees of flexion.  The patella is then everted, thickness measured to be 22 and freehand resection taken down to 12 mm.  A 38-mm template is placed and the lug holes are drilled.  The trial patella is placed and tracks normally.  All trial components are then removed and the posterior femur is visualized and posterior capsule is stripped off the femur but there are no osteophytes noted posteriorly.  The component surfaces are then prepared with  pulsatile lavage and the cement mixed.  Once ready for implantation, the size 4 mobile bearing tibial tray is cemented into its position, impacted and then the size 4 posterior stabilized tibia and 38-mm patella are all cemented in place and patella is adequately clamped.  The 12.5-mm trial tray is placed and the knee held in full extension and all extruded cement removed.  Once the cement has fully hardened, then the permanent 12.5-mm posterior stabilized rotating platform insert is placed into the tibial tray and the knee reduced.  Once again, he has excellent varus and valgus balance throughout full range of motion.  The wound is then copiously irrigated with antibiotic solution and the extensor mechanism closed over one limb of the Hemovac drain with interrupted #1 PDS.  The tourniquet is then released with a total time of 49 minutes.  Subcu is closed with interrupted 2-0 Vicryl and subcuticular with a running 4-0 Monocryl.  Incision is then dried and Steri-Strips and bulky sterile dressing applied.  Patient is placed in a knee immobilizer, awakened and transported to recovery in stable condition. DD:  03/06/00 TD:  03/06/00 Job: [id number]

## 2010-08-20 NOTE — Discharge Summary (Signed)
NAME:  Evan [name], Evan [name] NO.:  [id number]   MEDICAL RECORD NO.:  [id number]          PATIENT TYPE:  INP   LOCATION:  1505                         FACILITY:  [facility]   PHYSICIAN:  Ollen Gross, M.D.    DATE OF BIRTH:  30-Sep-1927   DATE OF ADMISSION:  02/01/2006  DATE OF DISCHARGE:  02/07/2006                                 DISCHARGE SUMMARY   ADMITTING DIAGNOSES:  1. Osteoarthritis left knee.  2. Varicose veins.  3. Coronary arterial disease.  4. Benign prostatic hypertrophy.  5. Hyperlipidemia.  6. Hyperlipidemia.  7. Insomnia.  8. History of gallstones.  9. Past history of postoperative urinary retention.   DISCHARGE DIAGNOSES:  1. Osteoarthritis left knee, status post left total knee arthroplasty.  2. Postoperative intractable hiccups, improving.  3. Recurrence of postoperative urinary retention.  4. Postoperative pulmonary bibasilar atelectases.  5. Varicose veins.  6. Coronary arterial disease.  7. Benign prostatic hypertrophy.  8. Hyperlipidemia.  9. Hyperlipidemia.  10.Insomnia.  11.History of gallstones.  12.Past history of postoperative urinary retention.  13.Acute blood loss anemia.  Did not require transfusion.  14.Postoperative hyponatremia.   PROCEDURE:  On February 01, 2006, left total knee.  Surgeon:  Dr. Lequita Halt.  Assistant:  Alexzandrew L. Perkins, P.A.-C.  Anesthesia:  General, postop  Marcaine pain pump.   CONSULTS:  Urology, Dr. Bjorn Pippin.   BRIEF HISTORY:  [name] is a 77 year old male with end-stage arthritis of  the left knee with intractable pain.  Previous successful right total knee.  Now presents for a left total knee.   LABORATORY DATA:  CBC showed hemoglobin 15.4, hematocrit 45.1, white cell  count 6.1, postop hemoglobin 12, drifted down to 11.7, last H&H 10.9 and  31.7.  PT/PTT on admission 13.9 and 28, respectively.  INR 1.1.  Serial pro  times followed.  Last PT/INR 20.9 and 1.7.  Chem panel on admission all  within normal limits.  Serial BMETs were followed.  Sodium did drop from 138  to 131, then down to 128.  Last BMET is pending at time of dictation.  Calcium dropped from 9.1 to 8.1, back up to 8.4.  Preop UA negative.  Blood  group type B positive.   EKG May 05, 2005:  Sinus rhythm, occasional premature ventricular  complexes, nonspecific ST and T wave abnormality, prolonged QT. Premature  ventricular complexes new since previous tracing, confirmed by Dr. Olga Millers.   HOSPITAL COURSE:  Admitted to [facility].  Tolerated procedure  well.  Later transferred from recovery room to the orthopedic floor.  Started on PCA and p.o. analgesics for pain control following surgery.  Did  have some pain.  Main complaint was itching.  Started on hydrocortisone  cream.  Hemovac drain was pulled on day 1.  Started to get up out of bed by  day 2.  He had a little bit of congestion and cough.  Chest x-ray ordered,  and it ruled out any kind of pulmonary issues and pneumonia.  The initial  chest x-ray showed no acute disease.  Encouraged incentive  spirometer.  Dressing was changed.  Incision looked good.  Started getting up with  therapy.  Ambulating about 50 feet.  Through the weekend, the patient  unfortunately after he had his Foley removed had some difficulty voiding,  requiring recurrent in and out catheterizations.  The Foley was placed after  two I&Os, and it was placed on the third.  Left Foley in.  Started on  Flomax, and removed the following day for a voiding trial.  By day 4, still  had difficulty voiding.  He was doing well with his therapy, though, walking  about 100 feet.  Due to still with some congestion, he had a repeat chest x-  ray again done on February 05, 2006, and that one showed bibasilar  atelectasis.  Again encouraged incentive spirometer.  No obvious pulmonary  issues were ongoing.  On Monday, February 06, 2006, he still had difficulty  voiding.  Still had  hiccups, which he was treated for, and also the reflux.  He was placed on Protonix b.i.d.  He had Prevacid at home.  Bowel  medications were provided to assist with bowel movements.  He did get some  relief.  Urology was consulted on Monday.  He had been started on Flomax.  The Foley which was placed back in over the weekend was left in and removed  on Tuesday, and he had been on the Flomax.  By Tuesday morning, the patient  had voided 150 cc, with 0 postvoid residual.  It was felt from that  standpoint that we would leave the Foley out and he could go home.  From an  orthopedic standpoint, he was doing well, walking between 100-200 feet.  He  did have some complaints of some stomach issues with the Percocet, so he was  switched to Vicodin and discharged home.   DISCHARGE PLAN:  The patient was discharged home on February 07, 2006.   DISCHARGE DIAGNOSES:  Please see above.   DISCHARGE MEDICATIONS:  1. Vicodin.  2. Robaxin.  3. Coumadin.  4. Flomax.   DIET:  Resume home diet.   FOLLOWUP:  In two weeks.   ACTIVITY:  Weightbearing as tolerated.  Home health PT, home health nursing.  Total knee protocol.   DISPOSITION:  Home.   CONDITION UPON DISCHARGE:  Improved.   Also, he will follow up with urology, Dr. Annabell Howells, in about two to three weeks  after discharge.  He will call his office for an appointment.      Alexzandrew L. Julien Girt, P.A.      Ollen Gross, M.D.  Electronically Signed    ALP/MEDQ  D:  02/07/2006  T:  02/07/2006  Job:  [id number]   cc:   Excell Seltzer. Annabell Howells, M.D.  Fax: [phone]   Ernestina Penna, M.D.  Fax: [phone]   Francisca December, M.D.  Fax: [phone]

## 2010-08-20 NOTE — Op Note (Signed)
NAME:  CISCO, KINDT NO.:  [id number]   MEDICAL RECORD NO.:  [id number]          PATIENT TYPE:  INP   LOCATION:  4713                         FACILITY:  [facility]   PHYSICIAN:  Ollen Gross. Vernell Morgans, M.D. DATE OF BIRTH:  16-Aug-1927   DATE OF PROCEDURE:  05/08/2005  DATE OF DISCHARGE:                                 OPERATIVE REPORT   PREOPERATIVE DIAGNOSIS:  Gallstone pancreatitis.   POSTOPERATIVE DIAGNOSIS:  Gallstone pancreatitis.   PROCEDURE:  Laparoscopic cholecystectomy with intraoperative cholangiogram.   SURGEON:  Dr. Carolynne Edouard.   ASSISTANT:  Dr. Corliss Skains.   ANESTHESIA:  General endotracheal.   PROCEDURE:  After informed consent was obtained, the patient was brought to  the operating placed in supine position on the operating table. After  adequate induction of general anesthesia, the patient's abdomen was prepped  with Betadine and draped in usual sterile manner. The area above the  umbilicus was infiltrated 0.25% Marcaine. A small incision was made with a  15 blade knife. This incision was carried down through the subcutaneous  tissue bluntly with a hemostat and [employer] retractors until the linea alba  was identified.  The linea alba was incised with a 15 blade knife and each  side was grasped Kocher clamps and elevated anteriorly. The preperitoneal  space then probed bluntly hemostat until the peritoneum was opened and  access was gained to the abdominal cavity. A 0 Vicryl pursestring stitch was  placed in the fascia surrounding the opening.  Hasson cannula was placed  through the opening and anchored in placed with a previously placed Vicryl  pursestring stitch.  The abdomen was then insufflated with carbon dioxide  without difficulty. The patient was placed in head-up position rotated  slightly with the right side up. A laparoscope was inserted through the  Hasson cannula and the right upper quadrant was inspected. The dome of the  gallbladder and liver were  readily identified. Next the epigastric region  was infiltrated 0.25% Marcaine. A small incision was made with a 15 blade  knife and a 10 mm port was placed bluntly through this incision into the  abdominal cavity under direct vision. Sites were then chosen laterally on  the right side of the abdomen for placement of 5 mm ports. Each of these  areas was infiltrated 0.25% Marcaine. Small stab incisions were made with a  15 blade knife.  5 mm ports were then placed bluntly through these incisions  into the abdominal cavity under direct vision.  A blunt grasper was placed  through the lateral most 5 mm port and used to grasp the dome of gallbladder  and elevate it anteriorly and superiorly. Another blunt grasper was placed  through the other 5 mm port and used to retract on the body and neck of the  gallbladder.  Some omental adhesions to the body of the gallbladder were  taken down bluntly with a dissector.  Once this was accomplished, the  peritoneal reflection at the gallbladder neck was opened with  electrocautery. Blunt dissection was then carried out in this area until the  gallbladder neck-cystic duct junction was readily identified and a good  window was created.  A single clip was then placed on the gallbladder neck.  A small ductotomy was made just below the clips with the laparoscopic  scissors.  Next a 14 gauge Angiocath was placed percutaneously through the  anterior abdominal wall under direct vision. A Reddick cholangiogram  catheter was placed through the Angiocath and flushed. The Reddick catheter  was then placed within the cystic duct and anchored in place with the clip.  Cholangiogram was obtained that showed no filling defects, good emptying in  the duodenum and adequate length on the cystic duct.  The anchoring clip and  catheters were then removed from the patient. Three clips were placed  proximally on the cystic duct and duct was divided between the two sets of  clips.  Posterior to this, the cystic artery was identified and again  dissected bluntly in a circumferential manner until a good window was  created. Two clips were placed proximally and one distally on the artery and  the artery was divided between the two sets of clips. Next a laparoscopic  hook cautery device was used to separate the gallbladder from the liver bed.  Prior to completely detaching the gallbladder from liver bed, the liver bed  was inspected and several small bleeding points coagulated with  electrocautery until the area was completely hemostatic.  Gallbladder was  then detached the rest of the way from the liver bed without difficulty. A  laparoscopic bag was placed through the epigastric port.  The gallbladder  was placed in the bag and bag was sealed.  The abdomen was then irrigated  copious amounts of saline until the effluent was clear. The laparoscope was  then moved to the epigastric port. The gallbladder grasper was placed  through the Hasson cannula and used to grasp the opening of the bag. The bag  with the gallbladder was then removed through the supraumbilical port  without difficulty with the Hasson cannula.  The fascial defect was closed  with the previously placed Vicryl pursestring stitch as well as with another  figure-of-eight zero Vicryl stitch. The rest of the ports were then removed  under direct vision and all were found to be hemostatic. Gas was allowed to  escape.  The incisions were all closed with interrupted for Monocryl  subcuticular stitches. Benzoin and Steri-Strips and sterile dressings were  applied. The patient tolerated procedure well.  At the end of the case, all  needle, sponge and instrument counts were correct. The patient was then  awakened and taken to recovery in stable condition.      Ollen Gross. Vernell Morgans, M.D.  Electronically Signed     PST/MEDQ  D:  05/08/2005  T:  05/08/2005  Job:  [id number]

## 2010-08-20 NOTE — Discharge Summary (Signed)
[facility]  Patient:    Evan [name], Evan [name]                     MRN: [id number] Adm. Date:  [id number] Disc. Date: [id number] Attending:  Loanne Drilling Dictator:   Druscilla Brownie Shela Nevin, P.A. CC:         Elvina Sidle, M.D., [facility]   Discharge Summary  ADMISSION DIAGNOSES: 1. Severe osteoarthritis with osteonecrosis, right knee. 2. Coronary artery disease. 3. Gastroesophageal reflux disease. 4. Hyperlipidemia. 5. Benign prostatic hypertrophy.  DISCHARGE DIAGNOSES: 1. Severe osteoarthritis with osteonecrosis, right knee. 2. Coronary artery disease. 3. Gastroesophageal reflux disease. 4. Hyperlipidemia. 5. Benign prostatic hypertrophy. 6. Mild postoperative anemia.  OPERATION:  On March 06, 2000, the patient underwent right total knee replacement arthroplasty.  Avel Peace, P.A.-C. assisted.  CONSULTATIONS:  None.  BRIEF HISTORY:  This 77 year old male has progressive and significant osteoarthritis of the right knee.  X-rays have shown bone-on-bone changes medially.  This pain is refractory to nonoperative management, and the patient presented for total knee replacement arthroplasty.  He as clear preoperatively by Dr. Elvina Sidle of [facility].  COURSE IN THE HOSPITAL:  The patient tolerated the surgical procedure quite well.  He was placed into the total knee protocol.  Drain was removed without difficulty on first postop day, and dressing was changed.  Wound remained dry, and Steri-Strips remained intact.  He tolerated the CPM machine throughout his hospital stay.  He was placed on Coumadin protocol postoperatively for prevention of DVT.  We placed him on Flomax which assisted him on postoperative voiding.  He was on this throughout his hospitalization.  The patient had a bout of diarrhea during the night of the second postop day. We did a C. difficile toxin lab, and this was negative, so  we treated it with Imodium.  On the day of discharge, the wound was dry.  Home equipment had been made available for him in his home, and arrangements were made for him to have outpatient physical therapy.  His wife and son were in the room at the time of discharge, and he was discharged home.  LABORATORY DATA:  Hematologically in hospital, preoperative CBC completely within normal limits.  The white count was very slightly elevated at 12.2. Final hemoglobin was 11.4, hematocrit 32.2.  Blood chemistries remained within normal limits x 2.  Urinalysis negative for urinary tract infection.  Blood type is B positive.  C. difficile was negative for toxins.  Chest x-ray showed bilateral peribronchial thickening which is worse when compared to the radiograph from November 09, 1997, and may indicate acute worsening of bronchitis.  No evidence of edema or congestion.  Electrocardiogram showed normal sinus rhythm.  CONDITION UPON DISCHARGE:  Improved, stable.  PLAN:  The patient is discharged to his home in the care of his family.  He is to continue with his home medications and diet.  DISCHARGE MEDICATIONS: 1. Imodium AD p.r.n. diarrhea. 2. Percocet 5 mg, #50, 1 to 2 q.4-6h. p.r.n. pain. 3. Robaxin 500 mg, #40, 1 q.6h. p.r.n. muscle spasm. 4. Trinsicon, #60, 1 b.i.d. 5. Flomax 0.4 mg 1 q.d. for several days. 6. Benadryl p.r.n. itching. 7. Coumadin per pharmacy protocol.  FOLLOWUP:  He is to follow up with Dr. Milus Glazier concerning continuing Flomax and certainly for his general medical health. DD:  03/11/00 TD:  03/12/00 Job: [id number]

## 2010-08-20 NOTE — Discharge Summary (Signed)
NAME:  Evan [name], AYRE NO.:  [id number]   MEDICAL RECORD NO.:  [id number]          PATIENT TYPE:  INP   LOCATION:  4713                         FACILITY:  [facility]   PHYSICIAN:  Isidor Holts, M.D.  DATE OF BIRTH:  17-Feb-1928   DATE OF ADMISSION:  05/03/2005  DATE OF DISCHARGE:  05/10/2005                                 DISCHARGE SUMMARY   DISCHARGE DIAGNOSES:  1.  Gallstone pancreatitis.  2.  Possible sepsis, secondary to ascending cholangitis.  3.  Escherichia coli urinary tract infection.  4.  Hypertension.  5.  Two-vessel coronary artery disease.  6.  Gastroesophageal reflux disease.  7.  Benign prostatic hypertrophy.  8.  Dyslipidemia.  9.  History of hypertension.   DISCHARGE MEDICATIONS:  1.  Plavix 75 mg p.o. daily.  2.  Prevacid 30 mg p.o. daily.  3.  Clonazepam 0.5 mg p.o. t.i.d.  4.  Lipitor 40 mg p.o. daily.  5.  Ambien CR 10 mg p.o. q.h.s.  6.  Atenolol 50 mg p.o. daily.  7.  Aspirin 81 mg p.o. daily.  8.  Isosorbide Mononitrate 60mg  p.o. daily.  9.  Altace 5mg  p.o. daily.  10. Wellbutrin XL 150 mg p.o. daily.  11. Lotrimin AF 1% applied to skin rash on back twice a day.  12. Vicodin (5/500) one p.o. p.r.n. q.6 h. for pain, a total of 40 pills      have been dispensed.   PROCEDURES:  1.  Abdominal x-ray, dated 05/03/2005--this showed no acute lung disease;      however, it did show calcification in the left renal pelvis, probably      vascular, but difficult to exclude small distal left ureteral calculus.      No bowel obstruction or free air.  2.  Abdominal ultrasound scan, dated 04/24/2005--this showed small      gallstones with ductal dilatation.  The pancreas was obscured by bowel      gas.  3.  Portable chest x-ray, dated 05/03/2005, which showed cardiomegaly      without congestive heart failure, left subclavian central venous      catheter without pneumothorax.  4.  Abdominal/pelvic CT scan dated 05/04/2005.  This showed  cholelithiasis,      also biliary dilatation and distal common duct stones.  This is      consistent with biliary obstruction.  ERCP or MRCP is warranted to      further characterize.  Also a 7 mm right lower lobe nodule. Followup CT      in 3 months is warranted. Left nephrolithiasis.  5.  Endoscopic retrograde cholangiopancreatography with sphincterotomy,      dated 05/04/2005.  6.  Chest x-ray dated 05/05/2005--this showed central venous catheter      advancing to the SVC. Increasing left lower lobe atelectasis with      airspace disease.  7.  Operative cholangiography on 05/07/2005--this showed suspected retained      stones in the right anterior intrahepatic bile duct and in the mid      common bile duct.  8.  Chest x-ray dated 05/08/2005--this showed bibasilar atelectasis, free      intraperitoneal gas, possibly from recent cholecystectomy.   CONSULTATIONS:  1.  Dr. Herbert Moors, gastroenterology.  2.  Dr. Amil Amen, cardiologist.  3.  Dr. Ollen Gross. Carolynne Edouard, Careers adviser.   ADMISSION HISTORY:  As in H&P notes of May 03, 2005, dictated by Dr.  Mamie Levers. However, in brief, this is a 77 year old male, with known  history of coronary artery disease, hypertension, GERD, BPH, and  dyslipidemia, who presents with a 3-day history of left lower  chest/abdominal pain associated with vomiting and chills.  On the day of  presentation he noted that he was yellow.  He was found to be pyrexial and  jaundiced in the emergency department, and was admitted for the evaluation,  investigation and management.   CLINICAL COURSE BY PROBLEMS:  Problem #1:  GALLSTONE PANCREATITIS.  The  patient presents with upper abdominal pain, fever, chills, and jaundice.  Abdominal ultrasound scan, dated April 24, 2005 showed small gallstones,  but no ductal dilatation.  The patient was kept on bowel rest, intravenous  fluid hydration, antiemetics, and broad-spectrum antibiotic treatment.  Subsequent  abdominal/pelvic CT scan dated May 04, 2005, confirmed  cholelithiasis, but in addition showed biliary dilatation and distal common  duct stone, consistent with biliary obstruction.  ERCP/MRCP was recommended.  Gastroenterology consultation was called, which was kindly provided by Dr.  Graylin Shiver who performed an ERCP with sphincterotomy and stone  extraction from the common bile duct on May 04, 2005.  The patient  tolerated the procedure well, without any complications.  Sepsis protocol  had been started at the time of admission, for suspected ascending  cholangitis.  The patient did have a temperature of 103.7 and had blood  pressure which was on the low normal side, at 92/53.  By May 04, 2005 he  had responded to the above-mentioned treatment measures, and BP had  normalized at 110/66 mmHg.  It was felt that the patient would benefit from  a cholecystectomy.  Surgical consultation was called, and was kindly  provided by Dr. Chevis Pretty. Given the patient's background history of  coronary artery disease, it was felt that cardiology clearance would be  required.  Cardiology consultation was called, and was provided by Dr. Corliss Marcus who performed a cardiac catheterization on May 06, 2005, which  showed ahterosclerotic, 2-vessel coronary vascular disease, intact left  ventricle size and global systolic function.  EF greater than 70%.  Mild-to-  moderate left ventricular hypertrophy and systemic hypertension.  The  patient was, therefore, cleared from a cardiac standpoint for  cholecystectomy, and recommended to continue on beta blockade.  He  subsequently underwent laparoscopic cholecystectomy, with intraoperative  cholangiogram on May 07, 2005.  The procedure was uncomplicated.  clinical improvement was steady thereafter.  By postoperative day  postoperative day 1, the patient was able to manage p.o. intake and was able to ambulate subsequently, thereafter, and by  postoperative day 2 was on  regular diet without any discomfort.   Problem #2:  E. coli UTI.  The patient was found to have a positive urine  sediment consistent with a urinary tract infection.  Subsequent urine  culture, demonstrated E. coli which was pansensitive.  As patient was  already on Unasyn, it was felt that this would provide adequate coverage.  Urinalysis dated May 08, 2005 was negative, confirming resolution of  urinary tract infection.  Be that as it may, the patient has completed  a  full 7-day course of Unasyn on May 10, 2005.   Problem #3:  HYPERTENSION.  This was adequately controlled with beta blocker  treatment during the course of the patient's hospital stay.   Problem #4:  CORONARY ARTERY DISEASE, WITH CONFIRMED 2-VESSEL DISEASE;  STATUS POST CARDIAC CATHETERIZATION on May 06, 2005.  There were no  symptoms referable to this, during the course of the patient's hospital  stay.   Problem #5:  GERD.  The patient was asymptomatic on PPI.   Problem #6:  BPH.  The patient has a known history of BPH.  He was managed  with Foley catheterization, during the acute phase of his hospital stay.  However, Foley catheterization was discontinued on May 08, 2005 without  any deleterious effects whatsoever.   Problem #7:  DYSLIPIDEMIA.  The patient continues on statin treatment.   Problem #8:  HERPES LABIALIS.  The patient was noted on May 07, 2005 to  have H. labialis, affecting mainly the upper lip.  This was treated with  topical Zovirax cream, and as such, by May 09, 2005 had practically  resolved. In addition he did have a transient pruritic rash in the upper  back, which was noted on May 08, 2005. This was treated with topical  Lotrimin AF cream and oral Claritin.  As of May 10, 2005 the rash was  practically resolved.   DISPOSITION:  The patient was discharged in satisfactory condition, on  May 10, 2005.   DIET:  Low fat diet for  approximately 1 week, and then a healthy heart diet.   ACTIVITY:  Shower only, for 1 week.  He is recommended to increase activity  slowly.  No driving for one week or while taking narcotic medications.  No  lifting for 1 week.   WOUND CARE:  The patient is instructed to allow the Steri-Strips to fall  off. This is anticipated to occur within a week.  For pain management refer to discharge medication list.   FOLLOWUP INSTRUCTIONS:  The patient is instructed to followup with Dr. Carolynne Edouard,  surgeon, on May 31, 2005 at 8:45 a.m. telephone number [phone].  In addition he is to return to his PMD, at [facility]  within 1 week. He has been instructed to call for an appointment.   SPECIAL INSTRUCTIONS:  Abdominal/pelvic CT scan dated May 04, 2005  showed incidental finding of 7-mm right lower lobe nodule.  Followup CT scan  in 3 months is warranted.  We have recommended that the patient's PMD  arrange this.      Isidor Holts, M.D.  Electronically Signed     CO/MEDQ  D:  05/10/2005  T:  05/10/2005  Job:  [id number]   cc:   Ollen Gross. Vernell Morgans, M.D.  [address]  [place]  [place] [address]   Francisca December, M.D.  Fax: [phone]   Graylin Shiver, M.D.  Fax: [phone]

## 2010-08-20 NOTE — Consult Note (Signed)
NAME:  Evan [name], Evan [name] NO.:  [id number]   MEDICAL RECORD NO.:  [id number]          PATIENT TYPE:  INP   LOCATION:  1826                         FACILITY:  [facility]   PHYSICIAN:  Griffith Citron, M.D.DATE OF BIRTH:  [birth year]   DATE OF CONSULTATION:  05/03/2005  DATE OF DISCHARGE:                                   CONSULTATION   REASON FOR CONSULTATION:  I was asked to see Evan [name], a patient  known to me, who is a 77 year old white male presenting to Dr. Robb Matar  with symptoms of fever, rigor, chill, jaundice and abnormal liver enzymes.   HISTORY OF PRESENT ILLNESS:  The patient is a 77 year old white male who is  vacationing at [place], [place].  Approximately 48 hours ago,  Sunday evening, he developed a rigor, chill and diaphoresis.  Yesterday he  went to the emergency room because of recurrent episodic rigors.  He was  found to be febrile and noted to be jaundiced.  Hospital admission was  recommended  but the patient declined, electing to return to [place],  [place], where his cardiologist and gastroenterologists are.  I have  seen the patient previously for his routine colon screening and for  evaluation and care of his GERD.   The patient has continued to have fever, rigor, chills and diaphoresis.  He  has a fever of 103.7 on admission.  He is mildly hypotensive.  With the  rigors, he develops abdominal pain which is mild to moderate and diffuse in  nature radiating from the periumbilical location underneath both ribs.  No  radiation into the back.  No associated chest pain.  He denies nausea or  vomiting.  The bowel habits have remained regular, though the stool has  become lighter in color and the urine darker.  He noted jaundice about the  time that his rigors began, 48 hours ago.  Symptoms have not progressed  significantly but have ongoing.   The patient denies headache, stiff neck, shortness of breath, cough.  No  nausea or vomiting.  No diarrhea.   PAST MEDICAL HISTORY:  1.  GERD.  2.  CAD.  3.  Osteoarthritis.  4.  Hyperlipidemia.  5.  BPH.   PAST SURGICAL HISTORY:  1.  Status post CABG.  2.  Rotator cuff repair.  3.  Knee surgery.   FAMILY HISTORY:  Noncontributory.   REVIEW OF SYSTEMS:  Muscle soreness which he attributes to intense rigors.  Otherwise noncontributory.   OBJECTIVE:  GENERAL APPEARANCE:  Acutely ill, alert and oriented,  comfortable.  VITAL SIGNS:  Temperature 103.7, blood pressure 96/60, pulse 116.  HEENT:  Icteric sclerae.  No pallor.  No oropharyngeal lesion without lesion  of lip, tongue or gums.  NECK:  Supple.  No adenopathy, no thyromegaly.  CHEST:  Clear without adventitious sounds.  CARDIOVASCULAR:  Regular rhythm.  No gallop or murmur.  Status post CABG  scar.  ABDOMEN:  Mild distention, bowel sounds are present, right upper quadrant  fullness without discrete __________ edge.  Negative Murphy's sign.  No  tenderness to fist percussion.  No rebound, no guarding.  RECTAL:  Not performed.  EXTREMITIES:  No clubbing, cyanosis, or edema.  SKIN:  Jaundice, no stigmata of chronic liver disease.   EKG:  ST changes consistent with anterolateral ischemia.   LABORATORY DATA:  Abnormal for total bilirubin 9.3, alkaline phosphatase  290, AST 93, ALT 138.  CBC with wbc 9.3000, hemoglobin 14.8, hematocrit  42.9.  Protime 1.1 INR.  Lipase 1206.   Abdominal ultrasound reveals multiple small gallstones.  No evidence of  acute cholecystectomy.  Films not available for review.   ASSESSMENT:  1.  Ascending cholangitis.  2.  Gallstone pancreatitis.  3.  EKG changes consistent with ischemia.  4.  Early sepsis with mild hypotension   The patient's presentation including acute onset of a febrile illness with  evidence of bacteremia, development of jaundice and findings of  hyperbilirubinemia, hyperlipasemia and finding of small gallstones on  abdominal ultrasound are  all consistent with ascending cholangitis,  gallstone pancreatitis.  I suspect early sepsis with mild hypotension.  EKG  changes presumably secondary to underlying infection.   RECOMMENDATIONS:  1.  Agree with critical care management, IV fluids and stabilization of the      patient.  2.  Antibiotic coverage.  3.  ERCP should be performed urgently tomorrow morning.  4.  Surgical consultation - I discussed the case with Dr. Ovidio Kin who      will pass this on to the attending surgeon who will be covering tomorrow      morning.  5.  Follow-up EKGs.  6.  Keep NPO.  7.  IV Protonix.      Griffith Citron, M.D.  Electronically Signed     JRM/MEDQ  D:  05/03/2005  T:  05/04/2005  Job:  [id number]

## 2010-08-20 NOTE — Discharge Summary (Signed)
[place]. [facility]  Patient:    Evan [name], WION Visit Number: [id number] MRN: [id number]          Service Type: MED Location: [phone] Attending Physician:  Corliss Marcus Dictated by:   Anselm Lis, N.P. Admit Date:  05/09/2001 Discharge Date: 05/11/2001   CC:         Elvina Sidle, M.D.   Discharge Summary  PRIMARY CARE [name]:  Elvina Sidle, M.D.  PROCEDURES:  (05/10/01) Coronary bypass graft angiography as well as LV venogram.  DISCHARGE DIAGNOSES: 1. Coronary atherosclerotic heart disease:    a. Chest pain; ruled out myocardial infarction by negative serial cardiac       enzymes.  Non-ischemic EKG.    b. (05/10/01) Coronary bypass graft angiography and LV venogram revealing       normal LV systolic function.  Normal left main.  LAD occluded.  Proximal       OM1 off the LCX was 80 percent occluded.  Thirty percent ongoing left       circumflex.  RCA widely patent.  LIMA to the LAD widely patent.       Retrograde filling.  Radial to OM1, OM2 widely patent.  Suspected angina       from septal perforator #1 stenosis which was identified in angiography       8/99 and not clearly visualized today.  Will continue medical therapy       with increasing Imdur to 60 mg p.o. q.d. and initiation of Plavix 75 mg       p.o. q.d. 2. History of hypertension; good control under current medical regimen. 3. History of dyslipidemia; Lipitor. 4. History of benign prostatic hypertrophy. 5. History of gastroesophageal reflux disease, quiescent on Prevacid. 6. History of arthritis.  PLAN: 1. The patient was discharged home in stable condition. 2. Discharge medications:    a. (New) Plavix 75 mg, 1 p.o. q.d., take with food.  Will try to get the       patient some office samples.    b. (Increase) Isosorbide mononitrate 60 mg p.o. q.d.; he may take two       of his 30 mg tabs once daily until completed.    c. (Keep taking) Atenolol 50 mg, one half tab p.o.  q.d.    d. Lipitor 10 mg p.o. q.d.    e. Nitroglycerin tablet 0.4 mg sublingual p.r.n. chest pain, up to 3 tabs       in 15 minutes.    f. Bextra 20 mg p.o. q.d.    g. Trazodone 100 mg p.o. q.h.s.    h. Enteric-coated baby aspirin 81 mg p.o. q.d.    i. Clonazepam 0.5 mg p.o. b.i.d.    j. Altace 5 mg p.o. q.d.    k. Prevacid 30 mg p.o. b.i.d.    l. Prolex PD 60/10, one half tab p.o. b.i.d. (for congestion). 3. Activity: No heaving lifting, pushing, or driving today and tomorrow.  Then    activity as before. 4. Diet: Low fat, low cholesterol. 5. Wound care: May shower. 6. Special instructions: Call the clinic if he develops a large amount of    swelling or bruising in the groin area. 7. Followup: Dr. Corliss Marcus on Friday, 2/28 at 2:45 p.m.  HISTORY OF PRESENT ILLNESS:  Evan [name] is a very pleasant 77 year old gentleman with a history of CABG five years earlier.  Presenting symptoms were atypical in nature and a prior cardiolite and serial EKGs  within the 6-8 months prior to his MI and subsequent CABG were all normal.  He had had a subsequent coronary angiography in 8/99 revealing patent LIMA, patent radial artery to the OM1 and OM2.  There was a 90 percent stenosis at the origin of the large septal perforator off the LAD.  Normal LV size and function. Follow-up cardiolite 2/01 was without abnormalities with a good ejection fraction.  The patient also has positive family history of CAD, history of dyslipidemia and hypertension.  [name] had noticed over the last 2-3 months episodically some feeling of epigastric fullness and associated belching.  This seemed to be worsening over the last 2-3 weeks with associated feelings of light-headedness and weakness which was worse over the last few days.  Over the weekend, the patient had the sensations for which he took a total of two sublingual nitrates with relief. The next few days again a single nitroglycerin everyday with  improving symptoms.  The day before and the day of admission, he had the sensation of lower substernal "dead hurting" radiating to the left anterior chest which prompted his presenting to the emergency room.  He was given sublingual nitrates, IV nitrates, and 150 of Plavix, as well as Ativan.  He was started on Lovenox.  His EKG was non-ischemic.  A chest x-ray was negative for CHF. The patient ruled out for MI by negative serial cardiac enzymes.  He was taken for coronary angiography on the day of his admission with results as above.  PAST HISTORY:  As above.  Also: 1. (1998) At [facility], [place], CABG x3 with associated myocardial    infarction.  Left internal mammary artery to the mid LAD, sequential radial    artery to the first and then second obtuse marginal.  Of note, prior    serial EKGs and cardiolites from six months prior to myocardial infarction    were all normal in the setting of three vessel CAD.    b. (8/99) Cardiac catheterization revealing normal LV function, 100 percent       negative LAD, 90 percent origin of large septal perforator off the LAD,       80 percent native first obtuse marginal and patent RCA.  Bypass graft       angiography revealed patent LIMA to the LAD, patent sequential radial       artery to the obtuse marginal though there was a 30-40 percent ostial       stenosis of the radial artery at the ascending aorta.    c. (2/01) Cardiolite revealing ejection fraction of 65 percent, no ischemia       or scar.  No regional wall motion wall abnormalities. 2. History of arthritis. 3. Previous surgical history: Bilateral inguinal hernia repair, fairly recent    sinus surgery, repair of left rotator cuff, vein stripping right leg. 4. Laboratory tests: WBC was 7.2, hemoglobin 14.1, hematocrit 39.9, platelets    of 148.  Protime of 13.8, INR 1.1, PTT 26.  Sodium 136, K 3.9, chloride    107, CO2 24, glucose 117, BUN 22, creatinine 1.0, calcium 8.7,  magnesium    2.1.  First CK 74, MB fraction 1, troponin 0.01 seconds, CK of 67, MB     fraction 0.9, troponin at 0.01.  Third CK 63, MB fraction 0.7. 5. Admission chest x-ray revealed left upper lobe/lingular opacity obscuring    the heart border.  Potentially chronic.  Recommended comparison with old    films.  Negative CHF. 6. EKG revealed sinus bradycardia at 57 beats per minute without ischemic    changes.  NOTE:  Total time of preparing the discharge was greater than 30 minutes including dictating this discharge summary, filling and reviewing discharge instructions with the patient, and filling out prescriptions. Dictated by:   Anselm Lis, N.P. Attending Physician:  Corliss Marcus DD:  05/11/01 TD:  05/12/01 Job: [id number]

## 2010-08-20 NOTE — H&P (Signed)
[place]. [facility]  Patient:    Evan [name], Evan [name] Visit Number: [id number] MRN: [id number]          Service Type: MED Location: [phone] Attending Physician:  Evan [name] Dictated by:   Evan [name], N.P. Admit Date:  05/09/2001   CC:         Evan [name], M.D.   History and Physical  PRIMARY CARE Evan [name]:  Evan [name], M.D.  IMPRESSION:  (as dictated by Dr. Corliss [name]) 1. Atypical angina, likely unstable angina pectoris. 2. History of coronary atherosclerotic heart disease, three-vessel.    a. (1998) At [facility], [place], had coronary artery bypass        grafting x 3 associated with myocardial infarction.  Left internal        mammary artery to the mid left anterior descending artery, sequential        radial artery to first and obtuse marginal and second obtuse marginal.        Of note, prior serial EKGs and Cardiolite some six months prior to        myocardial infarction were all normal in the setting of three-vessel        coronary artery disease.    b. (August 1999) Cardiac catheterization revealing normal left       ventricular function; 100% native left anterior descending artery, 90%       origin of the large septal perforator off the left anterior descending       artery, 80% native first obtuse marginal, and patent right coronary       artery.  Bypass graft angiography revealed patent left internal mammary       artery to the left anterior descending artery and patent sequential       radial artery to obtuse marginal, though there was a 30-40% ostial       stenosis of the radial artery at the ascending aorta.    c. (February 2001) Cardiolite revealing ejection fraction 65%, no ischemia       nor scar.  No regional wall motion abnormality. 3. History of hypertension. 4. History of dislipidemia; on Lipitor. 5. Benign prostatic hypertrophy. 6. Gastroesophageal reflux disease; quiescent on Prevacid. 7.  Arthritis.  PLAN:  (as dictated by Dr. Corliss [name]) 1. Admit to telemetry, rule out MI protocol with serial cardiac enzymes and    serial EKGs. 2. IV nitro, subcu Lovenox, and Plavix. 3. Continue home medications. 4. Anticipate coronary angiography on May 10, 2001.  Risks, potential    complications, benefits, and alternatives to procedure were discussed in    detail.  Patient, his wife, and son indicate their questions and concerns    have been addressed and are agreeable to proceed.  HISTORY OF PRESENT ILLNESS:  Evan [name] is a very pleasant 77 year old gentleman with history of CABG five years earlier.  His presenting symptoms were atypical in nature and a prior Cardiolite and serial EKGs within the six to eight months prior to MI and subsequent CABG were all normal.  He had had a subsequent coronary angiography in August 1999 revealing patent LIMA, patent radial artery to the OM-1 and OM-2.  There was a 90% stenosis at the origin of a large septal perforator off the LAD.  Normal LV size and function.  A follow-up Cardiolite February 2001 was without abnormality, with good ejection fraction.  Patient also with positive family history of CAD, history of dislipidemia and hypertension.  [name] had noticed over the last two to three months episodically some feeling of epigastric fullness and associated belching.  This seemed to be worsening over the last two to three weeks with associated feelings of lightheadedness and weakness which is worse over the last few days.  Over the weekend, patient had these sensations for which he took a total of two sublingual nitrates with relief.  The next two days, again, a single nitro each day with improving the symptoms.  He developed, on the day of admission, sensation of lower substernal "dead hurting" radiating to the left anterior chest which prompted his presenting to the emergency room.  ______ history with sublingual nitrates, IV  nitrates, and given 150 of Plavix.  Also given 1 mg IV Ativan.  Started on Lovenox subcu.  His EKG is nonischemic and there is no CHF on chest x-ray.  First set of cardiac enzymes are negative.  PREVIOUS SURGICAL HISTORY:  Bilateral inguinal hernia repair, fairly recent sinus surgery, repair of left rotator cuff, vein stripping right leg.  ALLERGIES:  FELDENE, causing blisters and hives.  Past problems with TEQUIN, causing GI upset.  MEDICATIONS:  1. Prevacid 30 mg p.o. b.i.d.  2. Isosorbide mononitrate 30 mg p.o. q.d.  3. Altace 5 mg p.o. q.d.  4. Prolex PD 600/10 one-half tablet p.o. b.i.d.  5. Atenolol 50 mg one-half tablet p.o. q.d.  6. Lipitor 10 mg p.o. q.d.  7. Trazodone 100 mg p.o. q.h.s.  8. Bextra 20 mg p.o. q.d.  9. Enteric-coated aspirin 81 mg q.d. 10. Clonazepam 0.5 p.o. b.i.d.  SOCIAL HISTORY AND HABITS:  Patient has been married for 52 years.  Is retired from [employer].  Has five children alive and well.  Patient is a nonsmoker. No ETOH use.  He does have a history of chewing tobacco but none for about a year.  FAMILY HISTORY:  Strongly positive for CAD in that he has had one brother who died at age 65 of an MI, another brother died after his third CABG surgery. He has three sisters - two of whom have had CABG.  Hypertension also runs in the family.  The patients father died of heart attack at age 51 and mother died age of 23 of MI.  REVIEW OF SYSTEMS:  Has in HPI/previous medical history.  Otherwise, notes lightheadedness with fast position changes.  Decreased hearing in his right ear.  Wears glasses.  Good dentition.  Negative dysphagia to food or fluid.  A little bit of constipation but has been improved.  Negative melena nor bright red blood PR.  Some urinary hesitancy but this is stable.  Negative dysuria nor hematuria.  Generalized arthritic-type complaints.  Denies pedal edema, orthopnea, nor PND.  PHYSICAL EXAMINATION:  (as performed by Dr. Corliss [name])  VITAL SIGNS:  Blood pressure 124/73, heart rate 57 and regular, respiratory  rate 19, saturation 95% on room air.  He is afebrile.  GENERAL:  He is a well-nourished 77 year old gentleman, alert and pleasantly conversant.  HEENT:  Brisk bilateral carotid upstroke without bruit.  No significant JVD nor thyromegaly.  CARDIAC:  Regular rate and rhythm without murmur, rub, nor gallop.  Normal S1 and S2.  CHEST:  Lung sounds clear with equal bilateral excursion.  Negative CPA tenderness.  ABDOMEN:  Soft, nondistended, normal active bowel sounds.  Negative abdominal aortic, renal, nor femoral bruit.  Nontender to applied pressure.  No masses nor organomegaly appreciated.  VASCULAR:  Distal pulses intact.  Negative pedal edema.  LABORATORY TESTS AND DATA:  Chest x-ray revealed left upper lobe/lingular opacity obscuring the heart border.  Potentially chronic.  Recommended comparison with old films.  Negative CHF.  EKG revealed sinus bradycardia at 57 beats per minute without ischemic changes.  Protime 13.8, INR 1.1, PTT 26.  Sodium 136, potassium 3.9, chloride 107, CO2 24, glucose 117, BUN 22, creatinine 1.0, calcium 8.7.  CK 74, MB fraction 1.0, troponin I 0.01.  WBC 7.2, hemoglobin 14.1, hematocrit 39.9, platelets 148. Dictated by:   Evan [name], N.P. Attending Physician:  Evan [name] DD:  05/10/01 TD:  05/10/01 Job: [id number]

## 2010-08-20 NOTE — Cardiovascular Report (Signed)
NAME:  ZABIAN, SWAYNE NO.:  [id number]   MEDICAL RECORD NO.:  [id number]          PATIENT TYPE:  INP   LOCATION:  4713                         FACILITY:  [facility]   PHYSICIAN:  Francisca December, M.D.  DATE OF BIRTH:  1927/05/03   DATE OF PROCEDURE:  DATE OF DISCHARGE:                              CARDIAC CATHETERIZATION   PROCEDURES PERFORMED:  1.  Left heart catheterization.  2.  Coronary angiography.  3.  Left ventriculogram.  4.  Arterial conduit angiography.  5.  Radial artery free graft angio   INDICATIONS:  Mr. Abdurrahman Petersheim is a 77 year old man with known atherosclerotic  cardiovascular disease.  He is now eight years status post CABG.  This  included an LIMA to LAD, and a radial free graft to the OM-1, OM-II.  He was  admitted two days ago with severe pancreatitis.  An ECG at the time showed  sinus tachycardia rate of 100-110 and there was diffuse ST-segment  depression.  He has undergone ERCP with sphincterotomy for gallstone  pancreatitis and is recovering from that nicely.  He is to undergo a  cholecystectomy in the near future and requires cardiac clearance.   PROCEDURE NOTE:  The patient brought to cardiac catheterization laboratory  in a fasting state.  The right groin was prepped and draped in usual sterile  fashion.  Local anesthesia was obtained with infiltration of 1% lidocaine.  A 6-French catheter sheath was inserted percutaneously into the right  femoral artery, utilizing an anterior approach over a guiding J-wire. A 110-  cm pigtail catheter was used to measure pressures in the ascending aorta and  in the left ventricle, both prior to and following the ventriculogram.  A 30  degree RAO cine left ventriculogram was performed utilizing a power  injector, 45 cc were injected at 13 cc/sec.  The pigtail catheter was then  exchanged for a 6-French #4 left Judkins catheter.  This extended beyond the  origin of the left main.  It was, therefore,  exchanged for a #3.5 left  Judkins catheter.  Cineangiography of the left coronary artery was conducted  in the LAO and RAO projections. The left Judkins catheter was then exchanged  for a 6-French #4 right Judkins catheter.  Cineangiography of the right  coronary artery was conducted in LAO and RAO projections.  The 6-French  right coronary catheter was used to perform cineangiography of the right  radial free graft to the obtuse marginal.  This was performed in LAO and RAO  projections.  The right coronary catheter was then manipulated into the left  subclavian.  It was exchanged over a long guiding J-wire for an LIMA  catheter.  Cineangiography the LIMA graft to the LAD was conducted in LAO,  AP, and RAO projections.  At the completion of procedure, the catheter  sheath were removed. Hemostasis was achieved by direct pressure.  The  patient was transported to the recovery area in stable condition with intact  distal pulses.   HEMODYNAMIC RESULTS:  1.  Systemic arterial pressure was 175/91 with a mean of 124-mmHg.  2.  There was no systolic gradient across the aortic valve.  3.  Left ventricular end-diastolic pressure was 20-mmHg.   ANGIOGRAPHY:  1.  The left ventriculogram demonstrated normal chamber size and normal      global systolic function without regional wall motion abnormality.  A      visual estimated ejection fraction is 70-75%.  There is mild to moderate      left ventricular hypertrophy seen.  No significant mitral vegetation.      No significant coronary calcification.  2.  There is a right dominant coronary system present.  3.  The main left coronary artery was short and normal.  4.  The left anterior descending artery and its branches were highly      diseased.  This vessel is completely occluded at its ostium.  5.  The left circumflex artery and its branches are highly diseased.  There      is a long 80% stenosis at the origin of a moderate size first marginal       branch.  Competitive flow was seen in the distal portion of the vessel      and reflux into the graft is seen.  The ongoing circumflex is large and      has tandem 30% stenoses prior to the origin of the second large marginal      branch. The second marginal branch itself is free of obstruction.  The      ongoing circumflex gives rise to two small posterolateral branches at      the basal portion of the heart which are without significant      obstruction.  6.  The right coronary artery and its branches are without significant      obstruction.  There are luminal irregularities in the proximal mid and      distal portion but no significant obstruction. It bifurcates into a      posterior descending artery which is large and reaches and perfuses the      apex.  This vessel is without significant obstruction.  The      posterolateral segment is also large and gives rise to a large      posterolateral branch.  Again, no obstruction is seen in this portion of      vessel.   GRAFT ANGIOGRAPHY:  1.  The right radial free graft to the first obtuse marginal is widely      patent.  It inserts upon the mid portion of the vessel providing      antegrade and retrograde filling.  Retrograde filling enters the      circumflex and left main.  There are no significant obstructions seen in      the graft vessel.  There was not complete engagement of the graft origin      with the right coronary catheter.  However, there was adequate filling      throughout.  2.  The LIMA graft to the LAD is widely patent and inserts upon the mid      portion of the LAD.  It provides retrograde and antegrade filling.      Retrograde filling reaches the proximal portion of the anterior      descending artery.  There are no obstructions either retrograde or      antegrade in the anterior descending artery.  The antegrade portion of     the anterior descending artery is rather small and does not quite reach  the  apex.   FINAL IMPRESSION:  1.  Atherosclerotic coronary vascular disease, two-vessel.  2.  Intact left ventricular size and global systolic function, ejection      fraction greater than 70%.  3.  Mild to moderate left ventricular hypertrophy.  4.  Systemic hypertension.   PLAN:  The patient is clear from a cardiovascular standpoint for  cholecystectomy.  Medical therapy will be included which in the past has  meant beta-blocker, isosorbide dinitrate and Altace.      Francisca December, M.D.  Electronically Signed     JHE/MEDQ  D:  05/06/2005  T:  05/06/2005  Job:  [id number]

## 2010-08-20 NOTE — Consult Note (Signed)
NAME:  Evan [name], Evan [name] NO.:  [id number]   MEDICAL RECORD NO.:  [id number]          PATIENT TYPE:  INP   LOCATION:  1826                         FACILITY:  [facility]   PHYSICIAN:  Coralyn Helling, M.D.      DATE OF BIRTH:  July 18, 1927   DATE OF CONSULTATION:  05/03/2005  DATE OF DISCHARGE:                                   CONSULTATION   REFERRING PHYSICIAN:  Dr. Miachel Roux L. Robb Matar.   REASON FOR CONSULTATION:  Biliary sepsis, refractory hypertension.   HISTORY OF PRESENT ILLNESS:  This is a 77 year old male who was in [place] last weekend.  He says that on Sunday he developed  chills with rigors and a feeling of being extremely warm with sweats.  He  also had some nausea and had 1 episode of vomiting; this was associated with  diffuse abdominal pain from his lower to mid-abdomen up to his rib cage.  He  said he was seen at an urgent care center in [place] and was actually  advised to be admitted to the hospital.  The patient said that he had his  doctors in the [place] area and had requested to return to [place] for  further assessment of this.  He states that he has had minimal to eat or  drink since yesterday evening.  He is not currently complaining of any  abdominal pain, although he had some episodes of fever.  Of note also is  that when he initially presented to the emergency room, his blood pressure  systolic was anywhere from 130-160, but for the last 2-3 hours, his systolic  blood pressure has been in the 90s, in spite of receiving a significant  amount of intravenous fluid.  He currently denies having any headaches,  dizziness, chest pains, palpitations or shortness of breath.  He does  complain of feeling thirsty, however.   PAST MEDICAL HISTORY:  His past medical history is significant for:  1.  High cholesterol.  2.  Coronary artery disease, status post bypass grafting.  3.  Gastroesophageal reflux disease.  4.  BPH.  5.   Hypertension.   SURGICAL HISTORY:  Surgical history is significant for:  1.  Bilateral knee surgery.  2.  CABG.  3.  Bilateral herniorrhaphy.  4.  Bilateral rotator cuff surgery.   ALLERGIES:  He has no known drug allergies.   MEDICATIONS:  His medications currently are Ambien, Prevacid, clonazepam,  Tenormin, nitroglycerin, Lipitor, Plavix, Antivert, diclofenac, aspirin and  isosorbide.   SOCIAL HISTORY:  He has a history of alcohol use.  There is no history of  tobacco use.   FAMILY HISTORY:  Noncontributory.   REVIEW OF SYSTEMS:  Review of systems is negative, except for as stated  above.   PHYSICAL EXAMINATION:  VITAL SIGNS:  Temperature is 103.7.  Heart rate is  88.  Respiratory rate is 20.  Blood pressure is 92/53.  Oxygen saturation is  96%.  GENERAL:  The patient is a pleasant-appearing male seen in the emergency  room.  He did not appear  to be in any acute distress at this time.  HEENT:  He has scleral icterus.  Pupils are equal and reactive.  No sinus  tenderness.  Dry oral mucosa.  No oral lesions.  NECK:  No lymphadenopathy.  No thyromegaly.  HEART:  S1 and S2, regular rate and rhythm.  CHEST:  There was no wheezing or rales.  ABDOMEN:  Soft and nontender.  Positive bowel sounds.  No guarding or  rebound and no masses.  EXTREMITIES:  There was no edema, cyanosis or clubbing.  NEUROLOGIC:  He is alert and oriented x3, strength 5/5.  SKIN:  He does appear to be jaundiced.   LABORATORY VALUES:  WBC is 9.3, hemoglobin is 14.8, hematocrit is 42.9,  platelet count is 110,000.  Sodium is 139, potassium is 4.5, chloride is  105, CO2 is 22, BUN is 26, creatinine is 1.3, glucose is 103, calcium is  9.3, protein is 6.6, albumin 0.5, AST is 93, ALT is 138, ALP is 290,  bilirubin is 9.3.  PT is 14.1.  INR is 1.1.   RADIOLOGIC FINDINGS:  Chest x-ray did not show any acute disease.   Abdominal x-ray was nonspecific.   Ultrasound of the abdomen showed small gallstones with  no ductal dilation.   IMPRESSION:  Jaundice with fever, chills, rigors and hypotension.  At this  time, I would be concerned that the patient is possibly developing early  signs of sepsis.  He has been given antibiotics in the emergency room.  He  is scheduled to undergo CT scanning of his abdomen.  At this time, I would  place a central venous line for more aggressive fluid resuscitation, as well  as if he were to need pressor medications.  Additionally, we can also  monitor his central venous filling pressures and start him on early goal-  directed therapy and depending upon his response, would also consider adding  Xigris to his medical regimen, although I do not believe that his severity  of illness would warrant that at this time.  I will continue him on his  antibiotics, follow up on a lactic acid level, cortisol level, amylase and  lipase, as well as his blood cultures and then further recommendations will  be made based on results from above as well as his clinical response.      Coralyn Helling, M.D.  Electronically Signed     VS/MEDQ  D:  05/03/2005  T:  05/04/2005  Job:  [id number]

## 2010-08-20 NOTE — Op Note (Signed)
NAME:  Evan [name], HOUSEL NO.:  [id number]   MEDICAL RECORD NO.:  [id number]          PATIENT TYPE:  INP   LOCATION:  4713                         FACILITY:  [facility]   PHYSICIAN:  Graylin Shiver, M.D.   DATE OF BIRTH:  14-Jun-1927   DATE OF PROCEDURE:  05/04/2005  DATE OF DISCHARGE:                                 OPERATIVE REPORT   PROCEDURE:  Endoscopic retrograde cholangiogram with sphincterotomy and  stone extraction from the common bile duct.   ENDOSCOPIST:  Graylin Shiver, M.D.   INDICATIONS FOR PROCEDURE:  The patient is a 77 year old male who presented  to the hospital with upper abdominal pain.  He was found the jaundice.  He  was having shaking chills and high fever.  Ultrasound showed gallstones.  The patient was seen in consultation by Dr. Kinnie Scales who felt that the patient  had a ascending cholangitis.  The patient also had an elevated lipase  compatible with gallstone pancreatitis.  ERCP was requested on a semi-urgent  basis because of gallstone pancreatitis, ascending cholangitis and jaundice.   Informed consent was obtained after explanation of the risks of bleeding,  infection and perforation and pancreatitis.  The patient has been on Plavix  and the increased risks of bleeding were explained to him and his family.  Because he was on Plavix. it was felt, however, that because of the above  problems we could not wait to have the effects of Plavix reversed.  The  patient was seen in consultation by cardiology and they gave cardiac  clearance prior to ERCP.   PREMEDICATION:  Fentanyl 100 mcg IV, Versed 12 mg IV.   DESCRIPTION OF PROCEDURE:  With the patient lying on his abdomen on the  fluoroscopy table in the x-ray department, the Olympus lateral viewing  duodenum scope was inserted into the oropharynx and passed into the  esophagus.  It was advanced down the esophagus, then into the stomach. No  significant abnormalities were noted in the stomach.   The duodenum was then  entered.  The papilla of Vater was seen. The papilla appeared to be bulging  and had a swollen appearance to it.  Selective cannulation was achieved on  the first attempt using a guidewire and papillotome of the common bile duct.  The guidewire was advanced up into the biliary tree and the papillotome was  inserted into the bile duct.  Contrast was then injected into the bile duct.  Immediately upon injection there were two moderate-sized filling defects in  the distal common bile duct compatible with stones.  These appeared to be  about 1 centimeter in size.  There was some debris around it as well.  No  other significant filling defects were seen proximal to this.  There was  some filling of the intrahepatic but not much.  After proper localization of  the sphincterotome, a sphincterotomy was then made.  The old sphincterotome  was then removed and an endoscopic balloon was advanced over the guidewire  which was left in place in the bile duct.  The duct was  then swept with the  endoscopic balloon which was inflated to 8.5 mm.  Two distal common bile  duct stones were removed.  There was also a lot of yellowish muddy debris  that was swept out of the duct.  I did not see any pus.  There was some  oozing of blood from the sphincterotomy site after the sphincterotomy and  manipulation with balloon.  This area was watched for about 5 minutes after  all manipulation was completed and the area was washed.  The bleeding  stopped spontaneously, and I saw a good bile flow.  The final occlusion  cholangiogram appeared to have a clear bile duct,  and I saw good bile flow coming out of the sphincterotomy area. He tolerated  the procedure well without major complications.   IMPRESSION:  Choledocholithiasis.           ______________________________  Graylin Shiver, M.D.     SFG/MEDQ  D:  05/04/2005  T:  05/04/2005  Job:  [id number]   cc:   Miachel Roux L. Robb Matar, M.D.    Griffith Citron, M.D.  Fax: [phone]   [facility]   Francisca December, M.D.  Fax: [phone]

## 2010-08-20 NOTE — Discharge Summary (Signed)
NAME:  Evan [name], Evan [name] NO.:  [id number]   MEDICAL RECORD NO.:  [id number]                   PATIENT TYPE:  INP   LOCATION:                                       FACILITY:  [facility]   PHYSICIAN:  Almedia Balls. Ranell Patrick, M.D.              DATE OF BIRTH:  Aug 27, 1927   DATE OF ADMISSION:  10/20/2003  DATE OF DISCHARGE:  10/22/2003                                 DISCHARGE SUMMARY   ADMISSION DIAGNOSES:  1. Rotator cuff tear, right shoulder.  2. Hypertension.  3. Gastrointestinal reflux disease.  4. Anxiety.   DISCHARGE DIAGNOSES:  1. Right shoulder rotator cuff tear, status post arthroscopy and     debridement.  2. Hypertension.  3. Gastrointestinal reflux disease.  4. Anxiety.  5. Postoperative urinary retention, improved.   BRIEF HISTORY:  The patient is a 77 year old male who presented to the  office complaining about some continued right shoulder pain that has been  going on status post an injury.  The patient had an MRI demonstrating a  large rotator cuff tear.  Thus, it was elected to have a shoulder  arthroscopy and attempt for rotator cuff repair.  The patient had this  completed on October 19, 2003, and was admitted to the hospital to monitor his  cardiac events after surgery.   PROCEDURES:  The patient had a right shoulder arthroscopically assisted  subacromial decompression, open clavicle resection on October 19, 2003.  The  attending surgeon was Almedia Balls. Ranell Patrick, M.D.  The assistant was Maisie Fus B.  Dixon, P.A.-C.  The estimated blood loss was minimal.  Perioperative  antibiotics were given.  The patient tolerated the procedure well.   HOSPITAL COURSE:  The patient was admitted on October 19, 2003, to [facility] to have the above-stated procedure.  Initial laboratories showed a  white blood count of 10.6, hemoglobin 14.5, hematocrit 41.9, and a platelet  count of 163.  Chemistries were all normal, except for elevated BUN at 29  and elevated  glucose at 106.  The patient had the above-stated procedure on  October 19, 2003, and was transferred to the postanesthesia care unit and then  up to the fifth floor after an adequate time in the PACU.  The patient  tolerated the procedure well.  The patient was also given a Foley in the  operating room due to the patient's history of postoperative urinary  retention.  On postoperative day #1, the patient complained of some mild  tenderness to the right shoulder, but denied any numbness or tingling to the  right shoulder.  He was afebrile, alert, awake, and appropriate.  The  patient had the Foley removed and had some continued urinary retention.  Thus, the Foley was replaced.  Occupational and physical therapy consults  were completed, although limited results due to the patient's refusal and  complaint about pain.  On  postoperative day #2, the patient had his Foley  removed and was discharged home after physical therapy and OT evaluations.  Also, he was unable to urinate on his own.  Will see the patient back in the  office in 7-10 days.   FOLLOWUP:  The patient will follow up in the office in 7-10 days status post  discharge.  Will see the patient back in the office for outpatient checks  and physical therapy.   ACTIVITY:  Isometric strengthening of his right shoulder, which was  discussed before discharge.  Otherwise, sling to right arm.   DIET:  Regular.   DISCHARGE MEDICATIONS:  1. Altace 5 mg daily.  2. Claritin 10 mg daily.  3. Imdur 60 mg q.h.s.  4. Klonopin 0.5 mg b.i.d.  5. Plavix 75 mg daily.  6. Protonix 40 mg daily.  7. Wellbutrin 150 mg XL daily.  8. Zocor 40 mg daily.  9. Percocet 5/325 mg q.4-6h. as needed.  10.      Robaxin 500 mg q.6h. as needed.   ALLERGIES:  Please see the H&P.   CONDITION ON DISCHARGE:  Stable.      Thomas B. Durwin Nora, P.A.                     Almedia Balls. Ranell Patrick, M.D.    TBD/MEDQ  D:  10/22/2003  T:  10/22/2003  Job:  [id number]

## 2010-08-20 NOTE — H&P (Signed)
NAME:  OVID, WITMAN NO.:  [id number]   MEDICAL RECORD NO.:  [id number]           PATIENT TYPE:   LOCATION:                                 FACILITY:   PHYSICIAN:  Ollen Gross, M.D.         DATE OF BIRTH:   DATE OF ADMISSION:  02/01/2006  DATE OF DISCHARGE:                                HISTORY & PHYSICAL   CHIEF COMPLAINT:  Left knee pain.   HISTORY OF PRESENT ILLNESS:  The patient is a 77 year old male who has been  seen by Dr. Lequita Halt for ongoing left knee pain.  It has been progressive in  nature.  He has been told he had bone-on-bone end-stage arthritis.  He has  undergone arthroscopy in the past and also injections including viscous  supplementation without improvement despite conservative measures and also  knee arthroscopy.  He continues to have pain.  It is felt he has reached the  point where he could benefit from undergoing knee replacement.  The risks  and benefits have been discussed.  The patient is subsequently admitted to  the hospital.  He has been seen by Dr. Amil Amen preoperatively, felt there is  no contraindication for planned surgery.  He has also been seen by Dr. Christell Constant  preoperatively and felt he was cleared for surgery.  Felt medical problems  felt to be stable and maximally managed.   ALLERGIES:  TEQUIN AND FELDENE ARE DOCUMENTED ALLERGIES.   CURRENT MEDICATIONS:  He will stop his Plavix and aspirin preoperatively.  He does not recall all of his medications.  He will bring these to the  hospital.   PAST MEDICAL HISTORY:  1. Varicose veins.  2. Coronary arterial disease.  3. Benign prosthetic hypertrophy.  4. History of postoperative urinary tract infection.  5. Hyperlipidemia.  6. Gastroesophageal reflux disease.  7. Insomnia.  8. History of gallstones.   PAST SURGICAL HISTORY:  1. Recent cardiac catheterization.  2. CABG in 1998.  3. Bilateral inguinal hernia repairs.  4. Left and right rotator cuff repairs.  5. Vein  stripping right leg.  6. EGD with gallstone extraction.   SOCIAL HISTORY:  Married.  No tobacco products.  No alcohol products.  Five  children, 13 grandchildren.   FAMILY HISTORY:  Father deceased at age 73 with a history of stroke.  Mother  deceased at age 78.  One brother deceased at age 16, another brother  deceased at age 44.  Both brothers with heart attacks.  Several sisters have  undergone open heart surgery.   REVIEW OF SYMPTOMS:  GENERAL:  No fevers, chills, night sweats.  NEURO:  No seizures, syncope, paralysis.  RESPIRATORY:  No shortness of breath, productive cough, or hemoptysis.  CARDIOVASCULAR:  No chest pain, angina, orthopnea.  GI:  No nausea, vomiting, diarrhea, or constipation.  GU:  No dysuria, hematuria, or discharge.  MUSCULOSKELETAL:  Left knee.   PHYSICAL EXAM:  VITAL SIGNS:  Pulse 68, respirations 14, blood pressure  142/68.  GENERAL:  A 77 year old white male, well-nourished, well-developed in no  acute distress,  very pleasant, alert, oriented, and cooperative.  Average  historian.  HEENT:  Normocephalic, atraumatic.  Pupils are equal, round, and reactive.  Oropharynx clear.  EOMs intact.  NECK:  Supple.  CHEST:  Clear, anterior and posterior chest wall, no rhonchi, rales, or  wheezing.  HEART:  Regular rate and rhythm.  No murmur.  S1, S2 noted.  No rubs,  thrills, palpitations, or murmurs.  ABDOMEN:  Soft, slightly round, nontender.  Bowel sounds are present.  BREASTS:  Not done.  GENITALIA:  Not done.  __________ refer to present illness.  EXTREMITIES:  Left knee, no effusion.  Range of motion 5 to 120 degrees.  Marked crepitus is noted.  No instability.   Preop chest x-ray, outpatient, dated December 08, 2005, mild chronic  bronchitic and questionable mild chronic lingular interstitial changes,  possible underlying COPD.  No acute bony abnormalities.   EKG dated December 08, 2005, sinus rhythm.  Changes consistent with a right  ventricular  conduction delay, confirmed.   IMPRESSION:  1. Osteoarthritis, left knee.  2. Varicose veins.  3. Coronary arterial disease.  4. Benign prostatic hypertrophy.  5. Hyperlipidemia.  6. Gastroesophageal reflux disease.  7. Insomnia.  8. History of gallstones.  9. History of postoperative urinary retention.   PLAN:  The patient admitted to [facility] to undergo a left total knee  arthroplasty.  Surgery will be performed by Dr. Ollen Gross.      Alexzandrew L. Julien Girt, P.A.      Ollen Gross, M.D.  Electronically Signed    ALP/MEDQ  D:  02/02/2006  T:  02/03/2006  Job:  [id number]   cc:   Ernestina Penna, M.D.  Fax: [phone]   fax [phone] [facility]   Francisca December, M.D.  Fax: [phone]   Ollen Gross, M.D.  Fax: [phone]

## 2010-08-20 NOTE — H&P (Signed)
[facility]  Patient:    Evan [name], Evan [name]                        MRN: [id number] Adm. Date:  03/06/00 Attending:  Ollen Gross, M.D. Dictator:   Druscilla Brownie. Shela Nevin, P.A. CC:         Elvina Sidle, M.D. of [facility]   History and Physical  CHIEF COMPLAINT:  "Pain in my right knee."  HISTORY OF PRESENT ILLNESS:  This 77 year old white male has been seen by [place] with continuing problems concerning his right knee.  He has had advanced deterioration and degeneration of the right knee.  He was seen early on by Dr. Trellis Paganini who subsequently referred him to Dr. Darrelyn Hillock.  Dr. Fannie Knee no longer does surgery and Dr. Darrelyn Hillock had recent knee surgery himself so he was referred to Dr. Lequita Halt for evaluation concerning his right knee.  The patient has had very little results with intra-articular injections with corticosteroids as well as Xylocaine.  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  Due to the fact this patient did not respond to any conservative care, it was felt he would benefit from surgical intervention, and has been admitted for total knee replacement and arthroplasty of the right knee.  He has been cleared preoperatively by Dr. Elvina Sidle of [facility].  PAST MEDICAL HISTORY:  The patients surgery included CABG in 1998, bilateral inguinal herniorrhaphies in the past, repair of left rotator cuff in the past and vein stripping of the right leg in the past.  CURRENT MEDICAL PROBLEMS:  Coronary artery disease, benign prostatic hypertrophy, hyperlipidemia and insomnia.  He also has GERD.  CURRENT MEDICATIONS:  1. Prevacid 30 mg one q.d.  2. Lipitor 10 mg one q.d.  3. Atenolol 50 mg one q.d.  4. Ambien 10 mg h.s.  5. Alprazolam 0.25 mg one q.6-8h. p.r.n.  6. Ultram 50 mg one q.8h. p.r.n. pain.  7. Voltaren XR 100 mg one q.d. (will stop prior to surgery).  8.  Isosorbide 30 mg one q.d.  9. Altace 5 mg one q.d. 10. Nitrolingual 0.4 mg p.r.n. has not taken any recently.  ALLERGIES:  TEQUIN and FELDENE.  He also has allergies to some sort of tape or drapes that was used in the perioperative period that resulted in a rash.  The patient has donated no blood for this procedure.  SOCIAL HISTORY:  The patient is married.  He neither smokes nor drinks. Dr. Elvina Sidle and his cardiologists are [facility].  FAMILY HISTORY:  Noncontributory.  REVIEW OF SYSTEMS:  CNS:  No seizure disorder, paralysis, numbness or double vision.  RESPIRATORY:  No productive cough, no hemoptysis, no shortness of breath.  CARDIOVASCULAR:   No chest pain, no angina, no orthopnea. GASTROINTESTINAL:  No nausea, vomiting, melena or bloody stools. GENITOURINARY:  No discharge, dysuria or hematuria.  MUSCULOSKELETAL: Primarily in present illness with his right knee.  PHYSICAL EXAMINATION:  GENERAL:  Alert, cooperative, friendly, fully oriented, 77 year old white male.  VITAL SIGNS:  Blood pressure 130/62, pulse 80, respirations 12.  HEENT:  Normocephalic.  Wears glasses.  PERRLA.  EOM intact.  Oropharynx was clear.  CHEST:  Clear to auscultation, no rhonchi, no rales.  HEART:  Regular rate and rhythm, occasional extra systole, no murmurs are heard.  ABDOMEN:  Obese, soft, nontender, liver/spleen not felt.  GENITALIA/RECTAL/PELVIC:  Not done, not  pertinent to present illness.  EXTREMITIES:  Right knee with crepitus in range of motion and some mild valgus deformity and he has good sensation to the right leg.  ADMISSION DIAGNOSES: 1. Severe osteoarthritis with osteonecrosis right knee. 2. Coronary artery disease. 3. Gastroesophageal reflux disease. 4. Hyperlipidemia. 5. Benign prostatic hypertrophy.  PLAN:  The patient will undergo right total knee replacement arthroplasty.  We have not decided whether he will need inpatient or outpatient  physical therapy after his regular hospitalization. DD:  02/28/00 TD:  02/28/00 Job: [id number]

## 2010-08-20 NOTE — Cardiovascular Report (Signed)
[place]. [facility]  Patient:    Evan [name], Evan [name] Visit Number: [id number] MRN: [id number]          Service Type: MED Location: [phone] Attending Physician:  Evan [name] Dictated by:   Evan [name], M.D. Proc. Date: 05/10/01 Admit Date:  05/09/2001 Discharge Date: 05/11/2001   CC:         Evan [name], M.D. [name]   Cardiac Catheterization  PROCEDURES PERFORMED. 1. Left heart catheterization. 2. Coronary angiography. 3. Left ventriculogram. 4. Graft angiography, left internal mammary artery and right radial free graft    to obtuse marginal off ascending aorta.  CARDIOLOGIST:  Evan [name], M.D.  INDICATIONS:  Evan [name] is a 77 year old man, now four years status post CABG which included LIMA graft to LAD and a right radial free graft to the OM. He was admitted yesterday after prolonged chest pain which was rather atypical in nature.  No associated ECG changes.  Cardiac enzymes have been negative. He is brought now to the catheterization laboratory to identify possible progressive disease and provide for further therapeutic options.  Evan [name] has a history of normal Cardiolite studies in the setting of severe CAD.  This makes noninvasive diagnostic options limited.  PROCEDURE NOTE:  Left heart catheterization was performed following the percutaneous insertion of a 6-French catheter sheath utilizing an anterior approach over a guiding J wire into the right femoral artery.  A 110 cm pigtail catheter was used to measure pressures within the ascending aorta and in the left ventricle both prior to and following the ventriculogram.  A 30-degree RAO cine left ventriculogram was performed utilizing the power injector.  Then 6-French #4 left and right Judkins catheters were used to perform coronary angiography.  Cine angiography of each coronary artery was conducted in multiple LAO and RAO projections.  The right  Judkins catheter was also used to perform cine angiography of the right radial free graft to the OM.  There was not complete catheter engagement, however.  The right Judkins catheter was used to access the left subclavian.  Again, it would not fully engage the os at the LIMA.  We, therefore, exchanged over a long exchange wire for an LIMA catheter.  Cine angiography of the LIMA graft to the LAD was conducted in LAO and RAO projections.  The LIMA catheter was then exchanged for a 6-French RCB catheter.  Cine angiography of the right radial free graft to the OM was conducted in multiple projections.  At completion of the procedure, the catheters and catheter sheaths were removed.  Hemostasis was achieved by direct pressure.  The patient was transported to the recovery area in stable condition with intact distal pulses.  HEMODYNAMICS:  Systemic arterial pressure was 158/84 with a mean of 114 mmHg. There was no systolic gradient across the aortic valve.  The left ventricular end-diastolic pressure was 25 mmHg pre and post ventriculogram.  ANGIOGRAPHY:  The left ventriculogram demonstrated normal chamber size and normal systolic function with an estimated ejection fraction visually of greater than 70%.  There was catheter-induced mitral regurgitation seen.  No significant coronary calcification was present.  There was a right dominant coronary system present.  The main left coronary artery was short and normal.  The left anterior descending artery and its branches were highly diseased; this vessel was completely occluded proximally.  The left circumflex coronary artery and its branches were highly diseased; there is a 70 to 80% stenosis in  the origin of a moderate size first marginal branch.  Competitive flow was seen in the distal portion of this vessel.  The ongoing circumflex is large, has tandem 30% stenoses prior to the origin of a large second marginal branch which is free of  obstruction.  The ongoing circumflex then gives rise to two small posterolateral branches in the basal portion of the heart which are without significant obstruction.  The right coronary artery and its branches were without significant obstruction.  There are luminal irregularities in the proximal, mid, and distal portions, but no significant obstructions.  It bifurcates into the posterior descending artery which is large and reaches the apex without significant obstruction.  The posterolateral segment also is rather large and gives rise to a large posterolateral branch.  Again, no obstruction is seen in this portion of the vessel.  Graft angiography: The right radial free graft to the first circumflex marginal is widely patent, inserts in mid portion of vessel, providing antegrade and retrograde filling.  The retrograde filling enters the circumflex and left main.  There are no significant obstructions seen in this vessel.  Initially, incomplete engagement of the vessel with the right Judkins gave the impression of an ostial stenosis.  When adequately engaged with the RCB catheter, there was no significant obstruction.  The LIMA graft to the LAD is widely patent and inserts mid to distal portion of the LAD and provides retrograde and antegrade filling.  Retrograde filling in the 1999 study revealed a subtotal stenosis of the first septal perforator. This portion of the septal perforator was not well visualized on todays study.  No other significant obstructions are seen in the native circulation.  FINAL IMPRESSION: 1. Atherosclerotic cardiovascular disease, two vessel. 2. Intact left ventricular size and global systolic function.  Ejection    fraction greater than 70%. 3. Atypical angina likely due to this septal perforator obstruction or    perhaps gastrointestinal in origin.  PLAN/RECOMMENDATIONS:  The patient will be discharged on medical therapy only. He currently takes  Prevacid 30 mg b.i.d., isosorbide 30 mg p.o. q.d., Altace  5 mg p.o. q.d., and atenolol 50 mg 1/2 tablet p.o. q.d.  Will increase his isosorbide to 60 mg daily and consider adding Norvasc at his followup office visit. Dictated by:   Evan [name], M.D. Attending Physician:  Evan [name] DD:  05/10/01 TD:  05/11/01 Job: [id number]

## 2010-08-20 NOTE — Op Note (Signed)
NAME:  Evan [name], FLYE NO.:  [id number]   MEDICAL RECORD NO.:  [id number]          PATIENT TYPE:  INP   LOCATION:  0003                         FACILITY:  [facility]   PHYSICIAN:  Ollen Gross, M.D.    DATE OF BIRTH:  02-23-1928   DATE OF PROCEDURE:  02/01/2006  DATE OF DISCHARGE:                                 OPERATIVE REPORT   PREOP DIAGNOSIS:  Osteoarthritis left knee.   POSTOP DIAGNOSIS:  Osteoarthritis left knee.   PROCEDURE:  Left total knee arthroplasty.   SURGEON:  Ollen Gross, M.D.   ASSISTANT:  Alexzandrew L. Julien Girt, P.A.   ANESTHESIA:  General with postop Marcaine pain pump.   ESTIMATED BLOOD LOSS:  Minimal.   DRAINS:  Hemovac x1.   TOURNIQUET TIME:  36 minutes at 300 mmHg.   COMPLICATIONS:  None.   CONDITION:  Stable to recovery.   BRIEF CLINICAL NOTE:  Mr. Schicker is a 77 year old male with end-stage  osteoarthritis of the left knee with intractable pain.  He has had a  previous successful right total knee arthroplasty; and presents now for left  total knee arthroplasty.   PROCEDURE IN DETAIL:  After successful initiation of general anesthetic, a  tourniquet placed high on the left thigh and the left lower extremity  prepped and draped in the usual sterile fashion.  Extremities wrapped in  Esmarch, knee flexed, tourniquet inflated to 300 mmHg.  A midline incision  was made with the 10 blade through subcutaneous tissue to the level of the  extensor mechanism.  Fresh blade is used to make a medial parapatellar  arthrotomy.  Soft tissue over the proximal medial tibia was subperiosteally  elevated to the joint line with the knife introduced into the  semimembranosus bursa with a Cobb elevator.  Soft tissue laterally is  elevated with attention being paid to avoid the patellar tendon on tibial  tubercle.   The patella was subluxed laterally and the knee flexed 90 degrees, ACL and  PCL removed.  A drill was used to create a starting hole in  the distal  femur; and the canal was thoroughly irrigated.  A 5-degree left valgus  alignment guide is placed and referenced off the posterior condyles;  rotation is marked; and the block pinned to remove 10 mm off the distal  femur.  Distal femoral resection is made with an oscillating saw.  Sizing  blocks is placed and size 4 is most appropriate.  Rotation is marked off the  epicondylar axis.  Size 4 cutting block was placed and the anterior,  posterior, and chamfer cuts were made.   Tibia subluxed forward and the menisci removed.  Extramedullary tibial  alignment guide is placed referencing proximally to the medial aspect of the  tibial tubercle and distally along the second metatarsal axis and tibial  crest.  Blocks pinned to remove 10 mm of the nondeficient lateral side.  Tibial resection is made with the oscillating saw.  A Size 4 is the most  appropriate tibial component; and then the proximal tibia is prepared to the  modular drill  and keel punch for a size 4.  Femoral preparation is complete  with the intercondylar cut.   A size 4 mobile bearing tibial trial with a size 4 posterior stabilized  femoral trial, and a 10 mm posterior stabilized rotating platform insert  trial were placed.  With the 10, full extension is achieved with excellent  varus and valgus balance throughout full range of motion.  The patella was  everted and thickness measured to be 22 mm.  A freehand resection was taken  to 14 mm, a 38 template was placed; lug holes were drilled; a trial patella  was placed, and it tracks normally.  Osteophytes were removed off the  posterior femur with the trial in place.  All trials were removed and the  cut bone surfaces prepared with pulsatile lavage.   Cement is mixed and once ready for implantation to a size 4 mobile bearing  tibial tray, size 4 posterior stabilized femur, and 38 patella are cemented  into place and the patella was held with a clamp.  A trial 10-mm  insert is  placed and the knee held in full extension.  All extruded cement removed.  Once the cement is fully hardened, then a permanent 10 mm posterior  stabilized rotating platform insert is placed into the tibial tray.  The  wound was copiously irrigated with saline solution and the extensor  mechanism closed over a Hemovac drain with interrupted #1 PDS.  Flexion  against gravity is approximately 135 degrees.   Tourniquets is released with a total time of 36 minutes.  The subcu is  closed with interrupted 2-0 Vicryl, and subcuticular running 4-0 Monocryl.  The catheter for the Marcaine pain pump is placed; and the pump is  initiated.  Hemovac drain is hooked to suction.  Steri-Strips and a bulky  sterile dressing were applied; and he is placed into a knee immobilizer,  awakened, and transported to recovery in stable condition.      Ollen Gross, M.D.  Electronically Signed     FA/MEDQ  D:  02/01/2006  T:  02/01/2006  Job:  [id number]

## 2010-08-20 NOTE — H&P (Signed)
NAME:  Evan [name], Evan [name] NO.:  [id number]   MEDICAL RECORD NO.:  [id number]          PATIENT TYPE:  EMS   LOCATION:  MAJO                         FACILITY:  [facility]   PHYSICIAN:  Jonna L. Robb Matar, M.D.DATE OF BIRTH:  09/13/1927   DATE OF ADMISSION:  05/03/2005  DATE OF DISCHARGE:                                HISTORY & PHYSICAL   PRIMARY CARE PHYSICIAN:  [employer].   CARDIOLOGIST:  Francisca December, M.D.   GASTROENTEROLOGIST:  Griffith Citron, M.D.   CHIEF COMPLAINT:  Pain in his chest and chills.   HISTORY OF PRESENT ILLNESS:  This is a 77 year old white male who 3 days ago  started to develop lower chest pain, threw up 4-5 times yesterday, and began  to develop these shaking chills for the past 2 days that have just been  unbearable to the point where he actually is sore from having these chills.  He was seen at a hospital in [place], [facility].  They told him he  had elevated liver function tests and wanted him to be admitted, but the  patient preferred to come back to [facility] where his physicians are.  He  has not noticed any edema or distentions in his abdomen.  No diarrhea.  He  does have chronic constipation.  He is not sure when the jaundice started,  but he did notice today that he was yellow.  He said they tried to take a  temperature yesterday in [facility] when he felt like he was burning up,  but it registered as normal.  Here in the emergency room, the oral  temperature was normal, but the rectal temperature was 103.7.   ALLERGIES:  FELDENE gives him hives, TEQUIN gave him GI upset.   MEDICATIONS:  Ambien, Prevacid, Clonazepam, Atenolol, nitroglycerin tablets  p.r.n., Lipitor, Plavix, Antivert, Diclofenac, Coumadin, Robaxin, aspirin,  and isosorbide mononitrate.  Unfortunately, none of the dosages are listed.   PAST MEDICAL HISTORY:  Hypercholesterolemia, coronary artery disease with  hypertension, GERD,  BPH.   PAST SURGICAL HISTORY:  Left and right rotator cuff repairs, right total  knee replacement, left knee arthroscopy, three-vessel coronary artery bypass  grafting, bilateral inguinal herniorrhaphies, sinus surgery.   FAMILY HISTORY:  Significant for multiple members with coronary artery  disease, hypertension, and MI.   SOCIAL HISTORY:  Remote history of alcohol, nonsmoker, married, retired from  [employer].  Five children.   REASON FOR ADMISSION:  Chills, nausea and vomiting, positive for arthritis.  He has had chronic nocturia x3 to 4.  Chronic constipation.  Other systems  were reviewed and were negative.  He specifically says he has not had any  anginal type chest pains.  He does have problems with anxiety and takes  clonazepam because otherwise he gets a tightness in his chest that leads to  him feeling like he is having heart problems.   PHYSICAL EXAMINATION:  VITAL SIGNS:  Temperature 103.7 rectally, 98 orally,  pulse 111 when he came in and down to 88, respirations 38 down to 20, blood  pressure  131/106 initially and is 92/53, 96/56, and has remained  persistently low for the last couple of hours.  GENERAL:  He is a pleasant, alert, jaundiced white male.  He has obvious  scleral icterus.  HEENT:  Pupils are reactive, extraocular movements are fully.  He has normal  hearing, mucosa, and pharynx.  NECK:  Supple without mass, thyromegaly, or carotid bruits.  LUNGS:  Respiratory effort is normal.  Lungs are clear to A&P without  wheezing, rales, rhonchi, or dullness.  HEART:  There is a regular rate and rhythm, normal S1 and S2 without  murmurs, rubs, or gallops.  There is no cyanosis, clubbing, or edema.  He  has a sternotomy scar.  ABDOMEN:  Nontender, normal bowel sounds, no hepatosplenomegaly.  He has  inguinal herniorrhaphy scars.  There is no adenopathy.  Muscle strength is  5/5 with full range of motion all four extremities.  SKIN:  Other than jaundice, shows no  rashes or lesions.  NEUROLOGY:  Cranial nerves II-XII grossly intact.  DTR's are 2+.  Sensation  is normal.  He is alert and oriented x3.  Normal memory, judgment, and  affect.   LABORATORY DATA:  White count is 9.3 with 95% neutrophils, normal  hemoglobin, platelets slightly low at 110.  Cardiac enzymes are negative.  INR is 1.1.  Urinalysis shows a positive nitrite, small leukocyte esterase.   EKG shows nonspecific or slightly ischemic changes in lateral leads.   BUN 26, creatinine 1.3, bilirubin 9.3, alkaline phosphatase 290, AST 93, ALT  138, lipase is up to 1206.   Abdominal x-ray shows no obstruction.  Normal chest x-ray.  Ultrasound  showed small gallbladder stones, no cholecystitis.   IMPRESSION:  1.  Jaundice, pancreatitis.  I wonder if he had a gallstone pancreatitis,      even an ascending cholangitis that is not showing up yet.  A consult to      Dr. Kinnie Scales who will be coming in tonight to see him.  We will get a CAT      scan of his abdomen to get some further evaluation.  2.  Hypotension, possibly early septic shock.  The patient has been      cultured.  I will start him on Rocephin.  I have consulted critical care      medicine to help manage the hypotension.  3.  Urinary tract infection.  This might be a second source of possible      sepsis.  4.  Coronary artery disease.  5.  Hypercholesterolemia.  I am going to take him off any medications at      this point that would cause hypotension or liver problems.      Jonna L. Robb Matar, M.D.  Electronically Signed     JLB/MEDQ  D:  05/03/2005  T:  05/04/2005  Job:  [id number]   cc:   [facility]   Griffith Citron, M.D.  Fax: [phone]

## 2010-08-20 NOTE — Consult Note (Signed)
NAME:  AMOUR, CUTRONE NO.:  [id number]   MEDICAL RECORD NO.:  [id number]          PATIENT TYPE:  INP   LOCATION:  1505                         FACILITY:  [facility]   PHYSICIAN:  Excell Seltzer. Annabell Howells, M.D.    DATE OF BIRTH:  [birth year]   DATE OF CONSULTATION:  DATE OF DISCHARGE:                                   CONSULTATION   CHIEF COMPLAINT:  Urinary retention.   HISTORY:  Mr. Bramer is a 77 year old white male status post left total knee  arthroplasty on February 01, 2006.  He developed postoperative urinary  retention.  This morning, he voided 50 cc and had a residual of 800 cc.  He  then voided 400 cc, and a Foley catheter was placed for return of additional  400 cc.  He was placed on Flomax three days ago.  He denies voiding  complaints prior to admission except for prior episodes of postoperative  urinary retention, and on review of this chart, he did have an admission  back in February, where he had a UTI.   ALLERGIES:  Pertinent for allergies to TEQUIN and FELDENE.   ADMISSION MEDICATIONS:  1. Altace 5 mg daily.  2. Atenolol 50 mg daily.  3. Isosorbide mononitrate 60 mg 1-1/2 tabs in the morning.  4. Niaspan 500 mg nightly.  5. Lipitor 80 mg 1/2 tab q.a.m.  6. Plavix 75 mg daily and aspirin 81 mg daily, which were stopped on      October 25 and will be left off postop.  7. Astelin nasal spray 1-2 times daily.  8. Advair Discus 100/50 once or twice daily.  9. Prevacid 30 mg 1 or 2 times daily.  10.Ambien CR 12.5 mg at bedtime.  11.Nitroglycerin p.r.n.  12.Clonazepam 0.5 mg p.o. t.i.d.  13.Diclofenac 75 mg twice daily, which was held on admission.  14.Hydrocodone p.r.n.  15.Glycolax 17 gm as needed.  16.Calcium and magnesium 2 tablets daily.  17.Glucosamine chondroitin 2 tablets daily.  18.Fish oil 1200 mg daily.   MEDICAL HISTORY:  1. Coronary artery disease.  2. BPH.  3. History of postop UTI.  4. Hyperlipidemia.  5. GE reflux.  6. Insomnia.  7.  Gallstones.  8. Varicose veins.   SURGICAL HISTORY:  1. Coronary cath.  2. Coronary artery bypass graft in 1998.  3. Bilateral inguinal hernia repair.  4. Rotator cuff repair.  5. Vein stripping, right leg.  6. Gallstone extraction with upper endoscopy.   SOCIAL HISTORY:  Negative for tobacco or alcohol.   FAMILY HISTORY:  Pertinent for stroke and heart disease.   REVIEW OF SYSTEMS:  He currently has some pain related to his recent surgery  but denies shortness of breath or chest pain.  He denies GI complaints.  He  is otherwise without complaints except for the difficulty urinating with  small, frequent voids.   PHYSICAL EXAMINATION:  VITAL SIGNS:  His temp is 98.2, pulse 74, blood  pressure 115/65.  GENERAL:  He is a well-developed and well-nourished white male in no acute  distress.  Alert and oriented x3.  ABDOMEN:  Soft, obese without mass or hepatosplenomegaly.  There is a small  umbilical hernia but no inguinal hernias, no inguinal adenopathy is noted.  GU:  An unremarkable phallus with an adequate meatus.  There is a Foley at  the meatus.  Scrotum is unremarkable.  Testicles are bilaterally descended.  Normal in size and consistency without mass or  tenderness.  Epididymes are  unremarkable.  Anus and perineum without lesions.  RECTAL:  Normal sphincter tone.  The prostate is 1-2+ in size, benign in  consistency with only slight irregularities.  Seminal vesicles not palpable.  No rectal masses are noted.  EXTREMITIES:  Ecchymosis on the left from his recent surgery, and there is  some edema as well.  NEUROLOGIC:  He has no deficits.   ADMISSION LABS:  His urinalysis was clear.   IMPRESSION:  1. Postoperative urinary retention with benign prostatic hypertrophy and      outlet obstruction.  2. Coronary artery disease.   RECOMMENDATIONS:  I would continue the Flomax, maintain Foley catheter  drainage overnight.  The Foley will be removed in the morning.  We will   check residual urines.  If they are acceptable, he can go home without a  catheter.  If his residuals are greater than 300 cc, then he will likely  require replacement of his catheter and follow up with me on November 12 for  another voiding trial.      Excell Seltzer. Annabell Howells, M.D.  Electronically Signed     JJW/MEDQ  D:  02/06/2006  T:  02/07/2006  Job:  [id number]   cc:   Ollen Gross, M.D.  Fax: [phone]   Ernestina Penna, M.D.  Fax: [phone]   Francisca December, M.D.  Fax: [phone]

## 2010-08-31 ENCOUNTER — Encounter: Payer: Self-pay | Admitting: Cardiothoracic Surgery

## 2010-09-03 ENCOUNTER — Encounter: Payer: Self-pay | Admitting: Cardiothoracic Surgery

## 2010-09-10 ENCOUNTER — Encounter: Payer: Self-pay | Admitting: Nurse Practitioner

## 2010-10-03 ENCOUNTER — Encounter: Payer: Self-pay | Admitting: Cardiothoracic Surgery

## 2010-10-03 ENCOUNTER — Encounter: Payer: Self-pay | Admitting: Nurse Practitioner

## 2010-10-28 ENCOUNTER — Encounter: Payer: Self-pay | Admitting: Family Medicine

## 2010-10-28 DIAGNOSIS — K219 Gastro-esophageal reflux disease without esophagitis: Secondary | ICD-10-CM | POA: Insufficient documentation

## 2010-10-28 DIAGNOSIS — E785 Hyperlipidemia, unspecified: Secondary | ICD-10-CM | POA: Insufficient documentation

## 2010-10-28 DIAGNOSIS — I10 Essential (primary) hypertension: Secondary | ICD-10-CM | POA: Insufficient documentation

## 2010-11-03 ENCOUNTER — Encounter: Payer: Self-pay | Admitting: Cardiothoracic Surgery

## 2010-11-03 ENCOUNTER — Encounter: Payer: Self-pay | Admitting: Nurse Practitioner

## 2010-12-04 ENCOUNTER — Encounter: Payer: Self-pay | Admitting: Nurse Practitioner

## 2010-12-04 ENCOUNTER — Encounter: Payer: Self-pay | Admitting: Cardiothoracic Surgery

## 2011-01-03 ENCOUNTER — Encounter: Payer: Self-pay | Admitting: Cardiothoracic Surgery

## 2011-01-11 LAB — CBC
HCT: 40.8
Hemoglobin: 13.8
MCHC: 33.9
MCV: 95.3
Platelets: 139 — ABNORMAL LOW
RBC: 4.28
RDW: 13.1
WBC: 9.8

## 2011-01-11 LAB — BASIC METABOLIC PANEL
BUN: 19
CO2: 25
Calcium: 9
Chloride: 109
Creatinine, Ser: 1.07
GFR calc Af Amer: >60
GFR calc non Af Amer: >60
Glucose, Bld: 124 — ABNORMAL HIGH
Potassium: 3.8
Sodium: 140

## 2011-02-03 ENCOUNTER — Encounter: Payer: Self-pay | Admitting: Cardiothoracic Surgery

## 2011-02-15 ENCOUNTER — Encounter: Payer: Self-pay | Admitting: Nurse Practitioner

## 2011-03-05 ENCOUNTER — Encounter: Payer: Self-pay | Admitting: Cardiothoracic Surgery

## 2011-03-05 ENCOUNTER — Encounter: Payer: Self-pay | Admitting: Nurse Practitioner

## 2011-03-17 ENCOUNTER — Ambulatory Visit
Admission: RE | Admit: 2011-03-17 | Discharge: 2011-03-17 | Disposition: A | Discharge status: Home / Self Care | Payer: Medicare HMO | Source: Ambulatory Visit | Attending: Family Medicine | Admitting: Family Medicine

## 2011-03-17 ENCOUNTER — Other Ambulatory Visit: Payer: Self-pay | Admitting: Family Medicine

## 2011-03-17 DIAGNOSIS — R0781 Pleurodynia: Secondary | ICD-10-CM

## 2011-04-12 ENCOUNTER — Other Ambulatory Visit: Payer: Self-pay | Admitting: Family Medicine

## 2011-04-12 DIAGNOSIS — J329 Chronic sinusitis, unspecified: Secondary | ICD-10-CM

## 2011-04-15 ENCOUNTER — Other Ambulatory Visit: Payer: Medicare HMO

## 2011-05-04 ENCOUNTER — Ambulatory Visit
Admission: RE | Admit: 2011-05-04 | Discharge: 2011-05-04 | Disposition: A | Discharge status: Home / Self Care | Payer: Medicare HMO | Source: Ambulatory Visit | Attending: Family Medicine | Admitting: Family Medicine

## 2011-05-04 DIAGNOSIS — J329 Chronic sinusitis, unspecified: Secondary | ICD-10-CM

## 2011-06-18 ENCOUNTER — Other Ambulatory Visit: Payer: Self-pay

## 2011-06-18 ENCOUNTER — Emergency Department (HOSPITAL_COMMUNITY): Payer: Medicare HMO

## 2011-06-18 ENCOUNTER — Encounter (HOSPITAL_COMMUNITY): Payer: Self-pay | Admitting: Emergency Medicine

## 2011-06-18 ENCOUNTER — Emergency Department (HOSPITAL_COMMUNITY)
Admission: EM | Admit: 2011-06-18 | Discharge: 2011-06-19 | Disposition: A | Discharge status: Home / Self Care | Payer: Medicare HMO | Attending: Emergency Medicine | Admitting: Emergency Medicine

## 2011-06-18 DIAGNOSIS — R609 Edema, unspecified: Secondary | ICD-10-CM | POA: Insufficient documentation

## 2011-06-18 DIAGNOSIS — J45909 Unspecified asthma, uncomplicated: Secondary | ICD-10-CM | POA: Insufficient documentation

## 2011-06-18 DIAGNOSIS — Z9889 Other specified postprocedural states: Secondary | ICD-10-CM | POA: Insufficient documentation

## 2011-06-18 DIAGNOSIS — T18108A Unspecified foreign body in esophagus causing other injury, initial encounter: Secondary | ICD-10-CM

## 2011-06-18 DIAGNOSIS — K219 Gastro-esophageal reflux disease without esophagitis: Secondary | ICD-10-CM | POA: Insufficient documentation

## 2011-06-18 DIAGNOSIS — J189 Pneumonia, unspecified organism: Secondary | ICD-10-CM

## 2011-06-18 DIAGNOSIS — R079 Chest pain, unspecified: Secondary | ICD-10-CM | POA: Insufficient documentation

## 2011-06-18 DIAGNOSIS — Z951 Presence of aortocoronary bypass graft: Secondary | ICD-10-CM | POA: Insufficient documentation

## 2011-06-18 DIAGNOSIS — R112 Nausea with vomiting, unspecified: Secondary | ICD-10-CM | POA: Insufficient documentation

## 2011-06-18 DIAGNOSIS — E785 Hyperlipidemia, unspecified: Secondary | ICD-10-CM | POA: Insufficient documentation

## 2011-06-18 DIAGNOSIS — I10 Essential (primary) hypertension: Secondary | ICD-10-CM | POA: Insufficient documentation

## 2011-06-18 DIAGNOSIS — I2510 Atherosclerotic heart disease of native coronary artery without angina pectoris: Secondary | ICD-10-CM | POA: Insufficient documentation

## 2011-06-18 LAB — PRO B NATRIURETIC PEPTIDE: Pro B Natriuretic peptide (BNP): 800.9 pg/mL — ABNORMAL HIGH (ref 0–450)

## 2011-06-18 LAB — BASIC METABOLIC PANEL
BUN: 22 mg/dL (ref 6–23)
CO2: 23 mEq/L (ref 19–32)
Calcium: 9.8 mg/dL (ref 8.4–10.5)
Chloride: 103 mEq/L (ref 96–112)
Creatinine, Ser: 1.28 mg/dL (ref 0.50–1.35)
GFR calc Af Amer: 59 mL/min — ABNORMAL LOW (ref >90)
GFR calc non Af Amer: 51 mL/min — ABNORMAL LOW (ref >90)
Glucose, Bld: 87 mg/dL (ref 70–99)
Potassium: 4.4 mEq/L (ref 3.5–5.1)
Sodium: 139 mEq/L (ref 135–145)

## 2011-06-18 LAB — POCT I-STAT TROPONIN I: Troponin i, poc: 0 ng/mL (ref 0.00–0.08)

## 2011-06-18 LAB — CBC
HCT: 41 % (ref 39.0–52.0)
Hemoglobin: 14.3 g/dL (ref 13.0–17.0)
MCH: 32.4 pg (ref 26.0–34.0)
MCHC: 34.9 g/dL (ref 30.0–36.0)
MCV: 93 fL (ref 78.0–100.0)
Platelets: 167 10*3/uL (ref 150–400)
RBC: 4.41 MIL/uL (ref 4.22–5.81)
RDW: 12.9 % (ref 11.5–15.5)
WBC: 9.3 10*3/uL (ref 4.0–10.5)

## 2011-06-18 MED ORDER — ASPIRIN 325 MG PO TABS
325.0000 mg | ORAL_TABLET | ORAL | Status: DC
  Filled 2011-06-18: qty 1

## 2011-06-18 MED ORDER — ASPIRIN 81 MG PO CHEW
324.0000 mg | CHEWABLE_TABLET | Freq: Once | ORAL | Status: AC
  Administered 2011-06-18: 324 mg via ORAL
  Filled 2011-06-18: qty 4

## 2011-06-18 MED ORDER — NITROGLYCERIN 0.4 MG SL SUBL
0.4000 mg | SUBLINGUAL_TABLET | SUBLINGUAL | Status: DC | PRN
  Administered 2011-06-18 (×3): 0.4 mg via SUBLINGUAL
  Filled 2011-06-18: qty 25

## 2011-06-18 NOTE — ED Provider Notes (Signed)
History     CSN: [id number]  Arrival date & time 06/18/11  1955   First MD Initiated Contact with Patient 06/18/11 2017      Chief Complaint  Patient presents with  . Chest Pain    onset x2 hrs after swallowing flax seed and getting choked on it, denies stresnous activity or other event prior to onset of pain    (Consider location/radiation/quality/duration/timing/severity/associated sxs/prior treatment) HPI Comments: Patient is an 78 year old man with coronary artery disease. He had eaten some flaxseed, and got choked on it. He has been coughing it up and trying to gag himself to vomit it up, but still feels a pain in his left lateral chest. He choked on the flaxseed preparation around 5:30 PM. He took no medications for chest pain. He has a prior history of coronary artery disease with prior coronary artery bypass grafting, and also prior coronary stents. His last cardiac cath was in 2007 per his old chart. His cardiologist then was Corliss Marcus M.D.; he is now followed by Dr. Eldridge Dace.   Patient is a 78 y.o. male presenting with chest pain. The history is provided by the patient and medical records. No language interpreter was used.  Chest Pain The chest pain began 3 - 5 hours ago. Chest pain occurs constantly. The chest pain is unchanged. Associated with: He choked on a flax seed preparation, and subsequently developed chest pain when he gagged trying to get the flax seed back up.  At its most intense, the pain is at 6/10. The quality of the pain is described as dull. The pain does not radiate. Exacerbated by: Retching. Primary symptoms include nausea and vomiting. Pertinent negatives for primary symptoms include no fever. He tried nothing for the symptoms. Risk factors: Known coronary artery disease.  His past medical history is significant for CAD.  Procedure history is positive for cardiac catheterization.     Past Medical History  Diagnosis Date  . CAD (coronary artery disease)     . BPH (benign prostatic hyperplasia)   . Arthritis   . Asthma   . GERD (gastroesophageal reflux disease)   . Hyperlipidemia   . Hypertension     Past Surgical History  Procedure Date  . Cholecystectomy   . Rotator cuff repair   . Knee surgery   . Coronary artery bypass graft     History reviewed. No pertinent family history.  History  Substance Use Topics  . Smoking status: Never Smoker   . Smokeless tobacco: Former Neurosurgeon  . Alcohol Use: No      Review of Systems  Constitutional: Negative for fever.  HENT: Negative.   Eyes: Negative.   Respiratory: Positive for choking.   Cardiovascular: Positive for chest pain.  Gastrointestinal: Positive for nausea and vomiting.  Genitourinary: Negative.   Musculoskeletal: Negative.   Skin: Negative.   Neurological: Negative.   Psychiatric/Behavioral: Negative.     Allergies  Doxycycline; Feldene; Valium; and Tequin  Home Medications   Current Outpatient Rx  Name Route Sig Dispense Refill  . ATENOLOL 50 MG PO TABS Oral Take 50 mg by mouth daily.      . ATORVASTATIN CALCIUM 40 MG PO TABS Oral Take 40 mg by mouth daily.      Marland Kitchen CLONAZEPAM 1 MG PO TABS Oral Take 1 mg by mouth 3 (three) times daily as needed. For anxiety    . CLOPIDOGREL BISULFATE 75 MG PO TABS Oral Take 75 mg by mouth daily.      Marland Kitchen  CYCLOBENZAPRINE HCL 10 MG PO TABS Oral Take 10 mg by mouth 3 (three) times daily as needed. For muscle pain    . EZETIMIBE 10 MG PO TABS Oral Take 10 mg by mouth daily.      Marland Kitchen FLUTICASONE-SALMETEROL 115-21 MCG/ACT IN AERO Inhalation Inhale 2 puffs into the lungs 2 (two) times daily.      Marland Kitchen METANX PO Oral Take 1 tablet by mouth daily.     Marland Kitchen LANSOPRAZOLE 30 MG PO CPDR Oral Take 30 mg by mouth daily.      Marland Kitchen PREDNISONE 5 MG PO TABS Oral Take 5 mg by mouth daily.      Marland Kitchen RAMIPRIL 5 MG PO TABS Oral Take 5 mg by mouth daily.      . TRAMADOL HCL 50 MG PO TABS Oral Take 50 mg by mouth every 6 (six) hours as needed. For pain    . ZOLPIDEM  TARTRATE 10 MG PO TABS Oral Take 10 mg by mouth at bedtime as needed. For insomnia      BP 117/88  Pulse 59  Temp(Src) 97.2 F (36.2 C) (Oral)  Resp 18  SpO2 99%  Physical Exam  Nursing note and vitals reviewed. Constitutional: He is oriented to person, place, and time. He appears well-developed and well-nourished.       In mild distress complaining of pain in the left anterior chest.  HENT:  Head: Normocephalic and atraumatic.  Right Ear: External ear normal.  Left Ear: External ear normal.  Mouth/Throat: Oropharynx is clear and moist.  Eyes: Conjunctivae and EOM are normal. Pupils are equal, round, and reactive to light.  Neck: Normal range of motion. Neck supple.  Cardiovascular: Normal rate, regular rhythm and normal heart sounds.   Pulmonary/Chest: Effort normal and breath sounds normal.  Abdominal: Soft. Bowel sounds are normal.  Musculoskeletal: Normal range of motion. He exhibits edema.  Neurological: He is alert and oriented to person, place, and time.       No sensory or motor deficit.  Skin: Skin is warm and dry.  Psychiatric: He has a normal mood and affect. His behavior is normal.    ED Course  Procedures (including critical care time)   Labs Reviewed  CBC  BASIC METABOLIC PANEL  PRO B NATRIURETIC PEPTIDE   8:53 PM Pt was seen and had physical examination.  Lab workup ordered.  ASA and SL NTG ordered.  Old charts reviewed--had CABG and stenting in the past; last cath was in 2007.   11:54 PM Pt's lab tests and chest x-ray and EKG were good.  He complains of a feeling in the epigastric region and is spitting his saliva.  Will get esophagram to further evaluate.  12:58 AM Patient's condition discussed with Dr. Daun Peacock, who will assume his care and disposition.   No diagnosis found.        Carleene Cooper III, MD 06/19/11 [phone]

## 2011-06-18 NOTE — ED Notes (Signed)
Pt states that he is continuing to have hiccups and "burping up mucous"

## 2011-06-18 NOTE — ED Notes (Signed)
Pain unrelieved by nitro

## 2011-06-18 NOTE — ED Notes (Signed)
Pt placed on monitor.  

## 2011-06-18 NOTE — ED Notes (Signed)
[  pt reports pain onset after swallowing flax seed and choking states "I think it went down the wrong way" Hx CABG 1995

## 2011-06-19 ENCOUNTER — Emergency Department (HOSPITAL_COMMUNITY): Payer: Medicare HMO

## 2011-06-19 MED ORDER — AZITHROMYCIN 250 MG PO TABS: 250.0000 mg | ORAL_TABLET | Freq: Every day | ORAL | Status: AC

## 2011-06-19 NOTE — Discharge Instructions (Signed)
Aspiration Precautions Aspiration is the inhaling of a liquid or object into the lungs. Things that can be inhaled into the lungs include:  Food.   Any type of liquid, such as drinks or saliva.   Stomach contents, such as vomit or stomach acid.  When these things go into the lungs, damage can occur. Serious complications can then result, such as:  A lung infection (pneumonia).   A collection of pus in the lungs (lung abscess).  CAUSES  A decreased level of awareness (consciousness) due to:   Traumatic brain injury or head injury.   Stroke.   Neurological disease.   Seizures.   Decreased or absent gag reflex (inability to cough).   Medical conditions that affect swallowing.   Conditions that affect the food pipe (esophagus) such as a narrowing of the esophagus (esophageal stricture).   Gastroesophageal reflux (GERD). This is also known as acid reflux.   Any type of surgery where you are put under general anesthesia or have sedation.   Drinking large amounts of alcohol.   Taking medication that causes drowsiness, confusion, or weakness.   Aging.   Dental problems.   Having a feeding tube.  SYMPTOMS When aspiration occurs, different signs and symptoms can occur, such as:  Coughing (if a person has a cough or gag reflex) after swallowing food or liquids.   Difficulty breathing. This can include things like:   Breathing rapidly.   Breathing very slowly.   Hearing "gurgling" lung sounds when a person breaths.   Coughing up phlegm (sputum) that is:   Yellow, tan, or green in color.   Has pieces of food in it.   Bad smelling.   A change in voice (hoarseness).   A change in skin color. The skin may turn a "bluish" type color because of a lack of oxygen (cyanosis).   Fever.  DIAGNOSIS  A chest X-ray may be performed. This takes a picture of your lungs. It can show changes in the lungs if aspiration has occurred.   A bronchoscopy may be performed. This  is a surgical procedure in which a thin, flexible tube with a camera at the end is inserted into the nose or mouth. The tube is advanced to the lungs so your caregiver can view the lungs and obtain a culture, tissue sample, or remove an aspirated object.   A swallowing evaluation study may be performed to evaluate:   A person's risk of aspiration.   How difficult it is for a person to swallow.   What types of foods are safe for a person to eat.  PREVENTION If you are a caregiver to someone who may aspirate, follow the directions below. If you are caring for someone who can eat and drink through their mouth:  Have them sit in an upright position when eating food or drinking fluids, such as:   Sitting up in a chair.   If sitting in a chair is not possible, position the person in bed so they are upright.   Remind the person to eat slowly and chew well.   Do not distract the person. This is especially important for people with thinking or memory (cognitive) problems.   Check the person's mouth for leftover food after eating.   Keep the person sitting upright for 30 to 45 minutes after eating.   Do not serve food or drink for at least 2 hours before bedtime.  If you are caring for someone with a feeding tube and he   or she cannot eat or drink through their mouth:  Keep the person in an upright position as much as possible.   Do not  lay the person flat if they are getting continuous feedings. Turn the feeding pump off if you need to lay the person flat for any reason.   Check feeding tube residuals as directed by your caregiver. If a large amount of tube feedings are pulled back (aspirated) from the feeding tube, call your caregiver right away.  General guidelines to prevent aspiration include:  Feed small amounts of food. Do not force feed.   Use as little water as possible when brushing the person's teeth or cleaning his or her mouth.   Provide oral care before and after meals.     Never put food or fluids in the mouth of a person who is not fully alert.   Crush pills and put them in soft food such as pudding or ice cream. Some pills should not be crushed. Check with your caregiver before crushing any medication.  SEEK IMMEDIATE MEDICAL CARE IF:   The person has trouble breathing or starts to breathe rapidly.   The person is breathing very slowly or stops breathing.   The person coughs a lot after eating or drinking.   The person has a chronic cough.   The person coughs up thick, yellow, or tan sputum.   The person has a fever or persistent symptoms for more than 72 hours.   The person has a fever and their symptoms suddenly get worse.  Document Released: 04/23/2010 Document Revised: 03/10/2011 Document Reviewed: 04/23/2010 [employer] Patient Information 2012 [employer]. 

## 2011-06-20 ENCOUNTER — Other Ambulatory Visit (HOSPITAL_COMMUNITY): Payer: Self-pay | Admitting: Emergency Medicine

## 2011-09-01 ENCOUNTER — Other Ambulatory Visit: Payer: Self-pay | Admitting: Family Medicine

## 2011-09-01 DIAGNOSIS — R0781 Pleurodynia: Secondary | ICD-10-CM

## 2011-09-01 DIAGNOSIS — R1012 Left upper quadrant pain: Secondary | ICD-10-CM

## 2011-09-02 ENCOUNTER — Ambulatory Visit
Admission: RE | Admit: 2011-09-02 | Discharge: 2011-09-02 | Disposition: A | Discharge status: Home / Self Care | Payer: Medicare HMO | Source: Ambulatory Visit | Attending: Family Medicine | Admitting: Family Medicine

## 2011-09-02 DIAGNOSIS — R1012 Left upper quadrant pain: Secondary | ICD-10-CM

## 2011-09-02 DIAGNOSIS — R0781 Pleurodynia: Secondary | ICD-10-CM

## 2011-09-02 MED ORDER — IOHEXOL 300 MG/ML  SOLN
100.0000 mL | Freq: Once | INTRAMUSCULAR | Status: AC | PRN
  Administered 2011-09-02: 100 mL via INTRAVENOUS

## 2012-05-29 ENCOUNTER — Encounter: Payer: Self-pay | Admitting: *Deleted

## 2012-05-29 DIAGNOSIS — K2100 Gastro-esophageal reflux disease with esophagitis, without bleeding: Secondary | ICD-10-CM | POA: Insufficient documentation

## 2012-05-29 DIAGNOSIS — Z951 Presence of aortocoronary bypass graft: Secondary | ICD-10-CM | POA: Insufficient documentation

## 2012-05-29 DIAGNOSIS — J45909 Unspecified asthma, uncomplicated: Secondary | ICD-10-CM | POA: Insufficient documentation

## 2012-05-29 DIAGNOSIS — M1990 Unspecified osteoarthritis, unspecified site: Secondary | ICD-10-CM | POA: Insufficient documentation

## 2012-06-21 ENCOUNTER — Encounter: Payer: Self-pay | Admitting: Family Medicine

## 2012-06-21 ENCOUNTER — Ambulatory Visit (INDEPENDENT_AMBULATORY_CARE_PROVIDER_SITE_OTHER): Payer: Medicare PPO | Admitting: Family Medicine

## 2012-06-21 VITALS — BP 108/58 | HR 58 | Temp 98.1°F | Resp 16 | Wt 181.0 lb

## 2012-06-21 DIAGNOSIS — R5381 Other malaise: Secondary | ICD-10-CM

## 2012-06-21 DIAGNOSIS — R001 Bradycardia, unspecified: Secondary | ICD-10-CM

## 2012-06-21 DIAGNOSIS — I498 Other specified cardiac arrhythmias: Secondary | ICD-10-CM

## 2012-06-21 DIAGNOSIS — R5383 Other fatigue: Secondary | ICD-10-CM

## 2012-06-21 NOTE — Patient Instructions (Signed)
Quit atenolol.

## 2012-06-21 NOTE — Progress Notes (Signed)
Subjective:     Patient ID: Evan [name], male   DOB: October 15, 1927, 79 y.o.   MRN: [id number]  HPI At last office visit patient complaining of fatigue and presyncopal episodes.  He was found to be bradycardic around 50 beats per minute.  At that point atenolol was decreased from 50 mg per day to 25 mg per day.  Patient reports improvement in his symptoms.  He denies any further presyncopal episodes.  His fatigue has improved.  Review of Systems  Constitutional: Positive for activity change and fatigue. Negative for fever and unexpected weight change.  HENT: Positive for rhinorrhea and postnasal drip.   Eyes: Negative.   Respiratory: Negative.   Cardiovascular: Negative.   Gastrointestinal: Negative.        Objective:   Physical Exam  Constitutional: He appears well-developed and well-nourished. No distress.  HENT:  Head: Normocephalic and atraumatic.  Eyes: Conjunctivae are normal. Pupils are equal, round, and reactive to light.  Neck: Normal range of motion. Neck supple. No thyromegaly present.  Cardiovascular: Regular rhythm.  Bradycardia present.   Pulmonary/Chest: Effort normal. No accessory muscle usage. Not tachypneic. No respiratory distress.  Abdominal: Soft. Normal appearance and bowel sounds are normal.       Assessment:     Fatigue Bradycardia    Plan:    I have asked the patient to discontinue atenolol altogether.  He is to monitor his blood pressure and his pulse rate.  His symptoms continued to improve he is just to follow with me in 3 months for complete physical exam.  Return immediately if his symptoms worsen.

## 2012-06-30 ENCOUNTER — Encounter (HOSPITAL_COMMUNITY): Payer: Self-pay | Admitting: Emergency Medicine

## 2012-06-30 ENCOUNTER — Emergency Department (HOSPITAL_COMMUNITY): Payer: Medicare HMO

## 2012-06-30 ENCOUNTER — Emergency Department (HOSPITAL_COMMUNITY)
Admission: EM | Admit: 2012-06-30 | Discharge: 2012-06-30 | Disposition: A | Discharge status: Home Health | Payer: Medicare HMO | Attending: Emergency Medicine | Admitting: Emergency Medicine

## 2012-06-30 DIAGNOSIS — Z951 Presence of aortocoronary bypass graft: Secondary | ICD-10-CM | POA: Insufficient documentation

## 2012-06-30 DIAGNOSIS — I10 Essential (primary) hypertension: Secondary | ICD-10-CM | POA: Insufficient documentation

## 2012-06-30 DIAGNOSIS — I2510 Atherosclerotic heart disease of native coronary artery without angina pectoris: Secondary | ICD-10-CM | POA: Insufficient documentation

## 2012-06-30 DIAGNOSIS — E785 Hyperlipidemia, unspecified: Secondary | ICD-10-CM | POA: Insufficient documentation

## 2012-06-30 DIAGNOSIS — Z8739 Personal history of other diseases of the musculoskeletal system and connective tissue: Secondary | ICD-10-CM | POA: Insufficient documentation

## 2012-06-30 DIAGNOSIS — IMO0002 Reserved for concepts with insufficient information to code with codable children: Secondary | ICD-10-CM | POA: Insufficient documentation

## 2012-06-30 DIAGNOSIS — R509 Fever, unspecified: Secondary | ICD-10-CM | POA: Insufficient documentation

## 2012-06-30 DIAGNOSIS — K219 Gastro-esophageal reflux disease without esophagitis: Secondary | ICD-10-CM | POA: Insufficient documentation

## 2012-06-30 DIAGNOSIS — Z79899 Other long term (current) drug therapy: Secondary | ICD-10-CM | POA: Insufficient documentation

## 2012-06-30 DIAGNOSIS — Z87448 Personal history of other diseases of urinary system: Secondary | ICD-10-CM | POA: Insufficient documentation

## 2012-06-30 DIAGNOSIS — R079 Chest pain, unspecified: Secondary | ICD-10-CM

## 2012-06-30 DIAGNOSIS — Z7902 Long term (current) use of antithrombotics/antiplatelets: Secondary | ICD-10-CM | POA: Insufficient documentation

## 2012-06-30 DIAGNOSIS — J45909 Unspecified asthma, uncomplicated: Secondary | ICD-10-CM | POA: Insufficient documentation

## 2012-06-30 LAB — CBC
HCT: 38.4 % — ABNORMAL LOW (ref 39.0–52.0)
Hemoglobin: 13.7 g/dL (ref 13.0–17.0)
MCH: 32.6 pg (ref 26.0–34.0)
MCHC: 35.7 g/dL (ref 30.0–36.0)
MCV: 91.4 fL (ref 78.0–100.0)
Platelets: 134 10*3/uL — ABNORMAL LOW (ref 150–400)
RBC: 4.2 MIL/uL — ABNORMAL LOW (ref 4.22–5.81)
RDW: 13 % (ref 11.5–15.5)
WBC: 6.1 10*3/uL (ref 4.0–10.5)

## 2012-06-30 LAB — BASIC METABOLIC PANEL
BUN: 19 mg/dL (ref 6–23)
CO2: 24 mEq/L (ref 19–32)
Calcium: 9.6 mg/dL (ref 8.4–10.5)
Chloride: 102 mEq/L (ref 96–112)
Creatinine, Ser: 1.03 mg/dL (ref 0.50–1.35)
GFR calc Af Amer: 76 mL/min — ABNORMAL LOW (ref >90)
GFR calc non Af Amer: 66 mL/min — ABNORMAL LOW (ref >90)
Glucose, Bld: 103 mg/dL — ABNORMAL HIGH (ref 70–99)
Potassium: 4.2 mEq/L (ref 3.5–5.1)
Sodium: 137 mEq/L (ref 135–145)

## 2012-06-30 LAB — HEPATIC FUNCTION PANEL
ALT: 17 U/L (ref 0–53)
AST: 24 U/L (ref 0–37)
Albumin: 3.9 g/dL (ref 3.5–5.2)
Alkaline Phosphatase: 69 U/L (ref 39–117)
Bilirubin, Direct: 0.2 mg/dL (ref 0.0–0.3)
Indirect Bilirubin: 0.5 mg/dL (ref 0.3–0.9)
Total Bilirubin: 0.7 mg/dL (ref 0.3–1.2)
Total Protein: 6.6 g/dL (ref 6.0–8.3)

## 2012-06-30 LAB — POCT I-STAT TROPONIN I: Troponin i, poc: 0.01 ng/mL (ref 0.00–0.08)

## 2012-06-30 LAB — CG4 I-STAT (LACTIC ACID): Lactic Acid, Venous: 1.32 mmol/L (ref 0.5–2.2)

## 2012-06-30 LAB — LIPASE, BLOOD: Lipase: 51 U/L (ref 11–59)

## 2012-06-30 NOTE — ED Notes (Signed)
Onset in early morning developed left sided chest pain radiating to left neck and left upper extremity.  Pain improved from 8/10 achy sharp and currently 2/10 dull with general fatigue.

## 2012-06-30 NOTE — ED Notes (Signed)
Food tray ordered

## 2012-06-30 NOTE — ED Notes (Signed)
Pt discharged to home with family. NAD.  

## 2012-06-30 NOTE — ED Provider Notes (Signed)
History     CSN: [id number]  Arrival date & time 06/30/12  1035   First MD Initiated Contact with Patient 06/30/12 1120      Chief Complaint  Patient presents with  . Chest Pain    (Consider location/radiation/quality/duration/timing/severity/associated sxs/prior treatment) Patient is a 79 y.o. male presenting with chest pain. The history is provided by the patient and a relative.  Chest Pain  Patient here complaining of upper abdominal pain that radiated to his chest and down his left arm that lasted for approximately 2 hours. No associated dyspnea, diaphoresis, nausea or vomiting. Symptoms resolved spontaneously. They were not exertional. Patient does have a prior history of angina and this was not similar. A recent fever or cough. No syncope or near-syncope. No black or bloody stools. No treatment used prior to arrival. He is currently asymptomatic Past Medical History  Diagnosis Date  . CAD (coronary artery disease)   . BPH (benign prostatic hyperplasia)   . Arthritis   . Asthma   . GERD (gastroesophageal reflux disease)   . Hyperlipidemia   . Hypertension     Past Surgical History  Procedure Laterality Date  . Cholecystectomy    . Rotator cuff repair    . Knee surgery    . Coronary artery bypass graft      No family history on file.  History  Substance Use Topics  . Smoking status: Never Smoker   . Smokeless tobacco: Former Neurosurgeon  . Alcohol Use: No      Review of Systems  Cardiovascular: Positive for chest pain.  All other systems reviewed and are negative.    Allergies  Doxycycline; Feldene; Valium; and Tequin  Home Medications   Current Outpatient Rx  Name  Route  Sig  Dispense  Refill  . clopidogrel (PLAVIX) 75 MG tablet   Oral   Take 75 mg by mouth daily.           Marland Kitchen ezetimibe (ZETIA) 10 MG tablet   Oral   Take 10 mg by mouth daily.           . fluticasone-salmeterol (ADVAIR HFA) 115-21 MCG/ACT inhaler   Inhalation   Inhale 2 puffs  into the lungs 2 (two) times daily.           . folic acid (FOLVITE) 1 MG tablet   Oral   Take 1 mg by mouth daily.         Marland Kitchen LORazepam (ATIVAN) 1 MG tablet   Oral   Take 1 mg by mouth every 8 (eight) hours. Take whole in the am and 1/2 in pm         . methotrexate (RHEUMATREX) 2.5 MG tablet   Oral   Take 20 mg by mouth 3 (three) times a week.          . nitroGLYCERIN (NITROSTAT) 0.4 MG SL tablet   Sublingual   Place 0.4 mg under the tongue every 5 (five) minutes as needed for chest pain.         . ramipril (ALTACE) 5 MG tablet   Oral   Take 5 mg by mouth daily.           . rosuvastatin (CRESTOR) 5 MG tablet   Oral   Take 5 mg by mouth daily.         . temazepam (RESTORIL) 15 MG capsule   Oral   Take 15 mg by mouth at bedtime as needed for sleep.         Marland Kitchen  traMADol (ULTRAM) 50 MG tablet   Oral   Take 50 mg by mouth every 6 (six) hours as needed. For pain         . alendronate (FOSAMAX) 70 MG tablet   Oral   Take 70 mg by mouth once a week. Take with a full glass of water on an empty stomach.         Marland Kitchen oxyCODONE-acetaminophen (PERCOCET/ROXICET) 5-325 MG per tablet   Oral   Take 1 tablet by mouth every 6 (six) hours as needed for pain.         . pantoprazole (PROTONIX) 40 MG tablet   Oral   Take 40 mg by mouth daily.         Marland Kitchen venlafaxine (EFFEXOR) 37.5 MG tablet   Oral   Take 37.5 mg by mouth daily.         Marland Kitchen zolpidem (AMBIEN) 10 MG tablet   Oral   Take 10 mg by mouth at bedtime as needed. For insomnia           BP 134/52  Pulse 85  Temp(Src) 98.2 F (36.8 C) (Oral)  Resp 16  SpO2 100%  Physical Exam  Nursing note and vitals reviewed. Constitutional: He is oriented to person, place, and time. He appears well-developed and well-nourished.  Non-toxic appearance. No distress.  HENT:  Head: Normocephalic and atraumatic.  Eyes: Conjunctivae, EOM and lids are normal. Pupils are equal, round, and reactive to light.  Neck: Normal  range of motion. Neck supple. No tracheal deviation present. No mass present.  Cardiovascular: Normal rate, regular rhythm and normal heart sounds.  Exam reveals no gallop.   No murmur heard. Pulmonary/Chest: Effort normal and breath sounds normal. No stridor. No respiratory distress. He has no decreased breath sounds. He has no wheezes. He has no rhonchi. He has no rales.  Abdominal: Soft. Normal appearance and bowel sounds are normal. He exhibits no distension. There is no tenderness. There is no rebound and no CVA tenderness.  Musculoskeletal: Normal range of motion. He exhibits no edema and no tenderness.  Neurological: He is alert and oriented to person, place, and time. He has normal strength. No cranial nerve deficit or sensory deficit. GCS eye subscore is 4. GCS verbal subscore is 5. GCS motor subscore is 6.  Skin: Skin is warm and dry. No abrasion and no rash noted.  Psychiatric: He has a normal mood and affect. His speech is normal and behavior is normal.    ED Course  Procedures (including critical care time)  Labs Reviewed  CBC - Abnormal; Notable for the following:    RBC 4.20 (*)    HCT 38.4 (*)    Platelets 134 (*)    All other components within normal limits  BASIC METABOLIC PANEL  LIPASE, BLOOD  HEPATIC FUNCTION PANEL  POCT I-STAT TROPONIN I   Dg Chest 2 View  06/30/2012  *RADIOLOGY REPORT*  Clinical Data: Chest pain.  CHEST - 2 VIEW  Comparison: 06/18/2011 and CT from 09/01/1928  Findings: Two views of the chest again demonstrate postsurgical changes in the chest.  Lungs are clear.  Stable appearance of the heart and mediastinum.  No evidence for pleural effusions.  IMPRESSION: Stable postoperative changes in the chest.  No acute findings.   Original Report Authenticated By: Richarda Overlie, M.D.      No diagnosis found.    MDM   Date: 06/30/2012  Rate: 92  Rhythm: normal sinus rhythm  QRS Axis: normal  Intervals: normal  ST/T Wave abnormalities: normal   Conduction Disutrbances:none  Narrative Interpretation:   Old EKG Reviewed: unchanged   3:20 PM Patient offered admission for evaluation of his chest pain and he has deferred this time. The risk of sudden cardiac death has been explained to the patient and he expresses. He will followup with his cardiologist next week       Toy Baker, MD 06/30/12 1521

## 2012-07-03 ENCOUNTER — Telehealth: Payer: Self-pay | Admitting: Family Medicine

## 2012-07-03 MED ORDER — LORAZEPAM 1 MG PO TABS: 1.0000 mg | ORAL_TABLET | Freq: Three times a day (TID) | ORAL | Status: DC

## 2012-07-03 NOTE — Telephone Encounter (Signed)
?  ok to refill °

## 2012-07-03 NOTE — Telephone Encounter (Signed)
rx refilled.

## 2012-07-03 NOTE — Telephone Encounter (Signed)
Ok to refill 

## 2012-08-15 ENCOUNTER — Telehealth: Payer: Self-pay | Admitting: Family Medicine

## 2012-08-15 NOTE — Telephone Encounter (Signed)
Lorazepam 1mg  Take 1 tablet (1 mg total) by mouth every 8 (eight) hours. Take whole in the am and 1/2 in pm last refill 07/03/12 last ov 06/21/12

## 2012-08-16 MED ORDER — LORAZEPAM 1 MG PO TABS: 1.0000 mg | ORAL_TABLET | Freq: Three times a day (TID) | ORAL | Status: DC

## 2012-08-16 NOTE — Telephone Encounter (Signed)
Rx Refilled  

## 2012-08-16 NOTE — Telephone Encounter (Signed)
?   OK to Refill  

## 2012-08-16 NOTE — Telephone Encounter (Signed)
Ok to refill 

## 2012-09-04 ENCOUNTER — Telehealth: Payer: Self-pay | Admitting: Family Medicine

## 2012-09-04 NOTE — Telephone Encounter (Signed)
We could work him in tomorrow with MBD or Dr. Jeanice Lim?

## 2012-09-04 NOTE — Telephone Encounter (Signed)
We could work him in anytime tomorrow with MBD or Dr. Jeanice Lim?

## 2012-09-04 NOTE — Telephone Encounter (Signed)
Be glad to see him Thursday, but today is full and we are behind.

## 2012-09-04 NOTE — Telephone Encounter (Signed)
I offered that to him, but he did not like that. He only wants to see you.

## 2012-09-04 NOTE — Telephone Encounter (Signed)
Pt coming in tomorrow

## 2012-09-05 ENCOUNTER — Ambulatory Visit (INDEPENDENT_AMBULATORY_CARE_PROVIDER_SITE_OTHER): Payer: Medicare PPO | Admitting: Family Medicine

## 2012-09-05 ENCOUNTER — Encounter: Payer: Self-pay | Admitting: Family Medicine

## 2012-09-05 ENCOUNTER — Emergency Department (HOSPITAL_COMMUNITY)
Admission: EM | Admit: 2012-09-05 | Discharge: 2012-09-05 | Disposition: A | Discharge status: Home / Self Care | Payer: Medicare HMO | Attending: Emergency Medicine | Admitting: Emergency Medicine

## 2012-09-05 ENCOUNTER — Emergency Department (HOSPITAL_COMMUNITY): Payer: Medicare HMO

## 2012-09-05 ENCOUNTER — Encounter (HOSPITAL_COMMUNITY): Payer: Self-pay | Admitting: *Deleted

## 2012-09-05 VITALS — BP 140/70 | HR 62 | Temp 97.7°F | Resp 18 | Ht 63.5 in | Wt 178.0 lb

## 2012-09-05 DIAGNOSIS — R5382 Chronic fatigue, unspecified: Secondary | ICD-10-CM | POA: Insufficient documentation

## 2012-09-05 DIAGNOSIS — R531 Weakness: Secondary | ICD-10-CM

## 2012-09-05 DIAGNOSIS — I2510 Atherosclerotic heart disease of native coronary artery without angina pectoris: Secondary | ICD-10-CM | POA: Insufficient documentation

## 2012-09-05 DIAGNOSIS — R259 Unspecified abnormal involuntary movements: Secondary | ICD-10-CM | POA: Insufficient documentation

## 2012-09-05 DIAGNOSIS — E785 Hyperlipidemia, unspecified: Secondary | ICD-10-CM | POA: Insufficient documentation

## 2012-09-05 DIAGNOSIS — Z79899 Other long term (current) drug therapy: Secondary | ICD-10-CM | POA: Insufficient documentation

## 2012-09-05 DIAGNOSIS — I10 Essential (primary) hypertension: Secondary | ICD-10-CM

## 2012-09-05 DIAGNOSIS — R9431 Abnormal electrocardiogram [ECG] [EKG]: Secondary | ICD-10-CM

## 2012-09-05 DIAGNOSIS — R251 Tremor, unspecified: Secondary | ICD-10-CM

## 2012-09-05 DIAGNOSIS — Z951 Presence of aortocoronary bypass graft: Secondary | ICD-10-CM | POA: Insufficient documentation

## 2012-09-05 DIAGNOSIS — K219 Gastro-esophageal reflux disease without esophagitis: Secondary | ICD-10-CM | POA: Insufficient documentation

## 2012-09-05 DIAGNOSIS — N400 Enlarged prostate without lower urinary tract symptoms: Secondary | ICD-10-CM | POA: Insufficient documentation

## 2012-09-05 DIAGNOSIS — M129 Arthropathy, unspecified: Secondary | ICD-10-CM | POA: Insufficient documentation

## 2012-09-05 DIAGNOSIS — R5383 Other fatigue: Secondary | ICD-10-CM

## 2012-09-05 DIAGNOSIS — J45909 Unspecified asthma, uncomplicated: Secondary | ICD-10-CM | POA: Insufficient documentation

## 2012-09-05 DIAGNOSIS — Z7902 Long term (current) use of antithrombotics/antiplatelets: Secondary | ICD-10-CM | POA: Insufficient documentation

## 2012-09-05 DIAGNOSIS — R5381 Other malaise: Secondary | ICD-10-CM

## 2012-09-05 LAB — COMPREHENSIVE METABOLIC PANEL
ALT: 13 U/L (ref 0–53)
AST: 21 U/L (ref 0–37)
Albumin: 4.1 g/dL (ref 3.5–5.2)
Alkaline Phosphatase: 83 U/L (ref 39–117)
BUN: 17 mg/dL (ref 6–23)
CO2: 23 mEq/L (ref 19–32)
Calcium: 9.5 mg/dL (ref 8.4–10.5)
Chloride: 107 mEq/L (ref 96–112)
Creatinine, Ser: 1.02 mg/dL (ref 0.50–1.35)
GFR calc Af Amer: 76 mL/min — ABNORMAL LOW (ref >90)
GFR calc non Af Amer: 66 mL/min — ABNORMAL LOW (ref >90)
Glucose, Bld: 110 mg/dL — ABNORMAL HIGH (ref 70–99)
Potassium: 4.1 mEq/L (ref 3.5–5.1)
Sodium: 140 mEq/L (ref 135–145)
Total Bilirubin: 1 mg/dL (ref 0.3–1.2)
Total Protein: 7 g/dL (ref 6.0–8.3)

## 2012-09-05 LAB — URINALYSIS, ROUTINE W REFLEX MICROSCOPIC
Bilirubin Urine: NEGATIVE
Bilirubin Urine: NEGATIVE
Glucose, UA: NEGATIVE mg/dL
Glucose, UA: NEGATIVE mg/dL
Hgb urine dipstick: NEGATIVE
Hgb urine dipstick: NEGATIVE
Ketones, ur: NEGATIVE mg/dL
Ketones, ur: NEGATIVE mg/dL
Leukocytes, UA: NEGATIVE
Leukocytes, UA: NEGATIVE
Nitrite: NEGATIVE
Nitrite: NEGATIVE
Protein, ur: NEGATIVE mg/dL
Protein, ur: NEGATIVE mg/dL
Specific Gravity, Urine: 1.015 (ref 1.005–1.030)
Specific Gravity, Urine: 1.013 (ref 1.005–1.030)
Urobilinogen, UA: 0.2 mg/dL (ref 0.0–1.0)
Urobilinogen, UA: 0.2 mg/dL (ref 0.0–1.0)
pH: 7 (ref 5.0–8.0)
pH: 7 (ref 5.0–8.0)

## 2012-09-05 LAB — CBC
HCT: 40.6 % (ref 39.0–52.0)
Hemoglobin: 14.4 g/dL (ref 13.0–17.0)
MCH: 33.2 pg (ref 26.0–34.0)
MCHC: 35.5 g/dL (ref 30.0–36.0)
MCV: 93.5 fL (ref 78.0–100.0)
Platelets: 148 10*3/uL — ABNORMAL LOW (ref 150–400)
RBC: 4.34 MIL/uL (ref 4.22–5.81)
RDW: 12.8 % (ref 11.5–15.5)
WBC: 8.5 10*3/uL (ref 4.0–10.5)

## 2012-09-05 LAB — TROPONIN I: Troponin I: <0.3 ng/mL (ref <0.30)

## 2012-09-05 NOTE — ED Notes (Signed)
Pt given juice multiple times. Able to keep fluids down with no issues.

## 2012-09-05 NOTE — ED Notes (Signed)
Pt given fluids.  

## 2012-09-05 NOTE — ED Notes (Signed)
Pt given sandwich and water.

## 2012-09-05 NOTE — ED Notes (Signed)
Pt returned from radiology.

## 2012-09-05 NOTE — Assessment & Plan Note (Signed)
He has a new tremor and also complains of some change in his vision. He can be evaluated in the emergency room I discussed this with the ER attending pending CT of head is warranted.  He has no focal deficits at this time to suggest a large CVA

## 2012-09-05 NOTE — Assessment & Plan Note (Addendum)
His baseline was little off however his anterior leads showed some ST depression that was change from his previous EKG as well as nonspecific T-wave changes. He does not have any chest pain currently. He was given 325 mg of aspirin and was routed  to the emergency room for further evaluation along with his other symptoms  Pt transferred to [facility] ER via [facility], Son informed of my concerns , I discussed with EDP

## 2012-09-05 NOTE — Progress Notes (Signed)
  Subjective:    Patient ID: Evan [name], male    DOB: [birth year], 79 y.o.   MRN: [id number]  HPI  Patient presents to the office with weakness fatigue and tremor for the past 4 days. He also noticed that his vision has been more blurry than normal. He has no history of tremor. He denies any weakness on the side of his body. He had some chest tightness other day but none currently he also states that he's been having the sweats. He denies any URI symptoms or fever. He did not take his lorazepam for her temazepam last night because of prior days when he took it it made his tremor worse.   Review of Systems - per above  GEN- + fatigue, fever, weight loss,weakness, recent illness HEENT- denies eye drainage, change in vision, nasal discharge, CVS- denies chest pain, palpitations RESP- denies SOB, cough, wheeze ABD- denies N/V, change in stools, abd pain GU- denies dysuria, hematuria, dribbling, incontinence MSK- denies joint pain, muscle aches, injury Neuro- denies headache, dizziness, syncope, seizure activity      Objective:   Physical Exam  GEN- NAD, alert and oriented x3    Repeat BP 150/86 sitting, standing 148/88 HEENT- PERRL, EOMI, non injected sclera, pink conjunctiva, MMM, oropharynx clear, unable to get complete fundoscopic exam - no apparent papilledema Neck- Supple, no LAD CVS- bradycardia, no murmur RESP-CTAB EXT- No edema  Pulses- Radial, DP- 2+ Neuro- CNII-XII intact, moving all 4 ext, no facial droop, speech normal, resting tremor, worsened with purposeful movements,answers questions appropriately  EKG- NSR, mild ST depression in V2-V3, v4, non specific t wave abnormalities    Assessment & Plan:

## 2012-09-05 NOTE — ED Provider Notes (Signed)
History     CSN: [id number]  Arrival date & time 09/05/12  1016   First MD Initiated Contact with Patient 09/05/12 1105      Chief Complaint  Patient presents with  . Abnormal ECG    (Consider location/radiation/quality/duration/timing/severity/associated sxs/prior treatment) The history is provided by the patient and a relative.  pt with hx cad, c/o feeling mild general malaise, gen weakness, and felt shaky for the past 2-3 days. Symptoms constant. Denies cp or discomfort. Denies sob or unusual doe. No cough or uri c/o. No fever or chills. Normal appetite, although hasnt eaten today yet. No nvd. No dysuria or gu c/o. Denies change in meds, states is compliant w taking normal meds. Denies recent trauma or fall. No headache. No change in speech or vision. No focal or unilateral numbness/weakness. Family states patient/mental status, and functional ability appear c/w baseline. Went to pcp w above symptoms today and was referred to ED.    Past Medical History  Diagnosis Date  . CAD (coronary artery disease)   . BPH (benign prostatic hyperplasia)   . Arthritis   . Asthma   . GERD (gastroesophageal reflux disease)   . Hyperlipidemia   . Hypertension     Past Surgical History  Procedure Laterality Date  . Cholecystectomy    . Rotator cuff repair    . Knee surgery    . Coronary artery bypass graft      No family history on file.  History  Substance Use Topics  . Smoking status: Never Smoker   . Smokeless tobacco: Former Neurosurgeon  . Alcohol Use: No      Review of Systems  Constitutional: Negative for fever and chills.  HENT: Negative for neck pain and neck stiffness.   Eyes: Negative for visual disturbance.  Respiratory: Negative for cough and shortness of breath.   Cardiovascular: Negative for chest pain.  Gastrointestinal: Negative for vomiting, abdominal pain and diarrhea.  Genitourinary: Negative for dysuria and flank pain.  Musculoskeletal: Negative for back pain.   Skin: Negative for rash.  Neurological: Negative for numbness and headaches.  Hematological: Does not bruise/bleed easily.  Psychiatric/Behavioral: Negative for confusion.    Allergies  Doxycycline; Feldene; Valium; and Tequin  Home Medications   Current Outpatient Rx  Name  Route  Sig  Dispense  Refill  . alendronate (FOSAMAX) 70 MG tablet   Oral   Take 70 mg by mouth once a week. Take with a full glass of water on an empty stomach.         . clopidogrel (PLAVIX) 75 MG tablet   Oral   Take 75 mg by mouth daily.           Marland Kitchen ezetimibe (ZETIA) 10 MG tablet   Oral   Take 10 mg by mouth daily.           . fluticasone-salmeterol (ADVAIR HFA) 115-21 MCG/ACT inhaler   Inhalation   Inhale 2 puffs into the lungs 2 (two) times daily.           . folic acid (FOLVITE) 1 MG tablet   Oral   Take 1 mg by mouth daily.         Marland Kitchen LORazepam (ATIVAN) 1 MG tablet   Oral   Take 0.5-1 mg by mouth every 8 (eight) hours as needed for anxiety. Take 1 tablet every morning and take a half-tablet for afternoon doses.         . methotrexate (RHEUMATREX) 2.5 MG tablet  Oral   Take 20 mg by mouth 3 (three) times a week.          Marland Kitchen oxyCODONE-acetaminophen (PERCOCET/ROXICET) 5-325 MG per tablet   Oral   Take 1 tablet by mouth every 6 (six) hours as needed for pain.         . pantoprazole (PROTONIX) 40 MG tablet   Oral   Take 40 mg by mouth daily.         . ramipril (ALTACE) 5 MG tablet   Oral   Take 5 mg by mouth daily.           . rosuvastatin (CRESTOR) 5 MG tablet   Oral   Take 5 mg by mouth daily.         . temazepam (RESTORIL) 15 MG capsule   Oral   Take 15 mg by mouth at bedtime as needed for sleep.         . traMADol (ULTRAM) 50 MG tablet   Oral   Take 50 mg by mouth every 6 (six) hours as needed for pain.          Marland Kitchen venlafaxine (EFFEXOR) 37.5 MG tablet   Oral   Take 37.5 mg by mouth daily.         . nitroGLYCERIN (NITROSTAT) 0.4 MG SL tablet    Sublingual   Place 0.4 mg under the tongue every 5 (five) minutes as needed for chest pain.           BP 141/78  Pulse 72  Temp(Src) 98.1 F (36.7 C) (Oral)  Resp 16  SpO2 100%  Physical Exam  Nursing note and vitals reviewed. Constitutional: He is oriented to person, place, and time. He appears well-developed and well-nourished. No distress.  HENT:  Head: Atraumatic.  Nose: Nose normal.  Mouth/Throat: Oropharynx is clear and moist.  Eyes: Conjunctivae are normal. Pupils are equal, round, and reactive to light.  Neck: Normal range of motion. Neck supple. No tracheal deviation present. No thyromegaly present.  No stiffness or rigidity  Cardiovascular: Normal rate, regular rhythm, normal heart sounds and intact distal pulses.   Pulmonary/Chest: Effort normal and breath sounds normal. No accessory muscle usage. No respiratory distress.  Abdominal: Soft. Bowel sounds are normal. He exhibits no distension and no mass. There is no tenderness. There is no rebound and no guarding.  Genitourinary:  No cva tenderness  Musculoskeletal: Normal range of motion. He exhibits no edema and no tenderness.  Neurological: He is alert and oriented to person, place, and time. No cranial nerve deficit.  Motor intact bil.   Skin: Skin is warm and dry. No rash noted. He is not diaphoretic.  Psychiatric: He has a normal mood and affect.    ED Course  Procedures (including critical care time)  Results for orders placed during the hospital encounter of 09/05/12  CBC      Result Value Range   WBC 8.5  4.0 - 10.5 K/uL   RBC 4.34  4.22 - 5.81 MIL/uL   Hemoglobin 14.4  13.0 - 17.0 g/dL   HCT 16.1  09.6 - 04.5 %   MCV 93.5  78.0 - 100.0 fL   MCH 33.2  26.0 - 34.0 pg   MCHC 35.5  30.0 - 36.0 g/dL   RDW 40.9  81.1 - 91.4 %   Platelets 148 (*) 150 - 400 K/uL  COMPREHENSIVE METABOLIC PANEL      Result Value Range   Sodium 140  135 - 145  mEq/L   Potassium 4.1  3.5 - 5.1 mEq/L   Chloride 107  96 -  112 mEq/L   CO2 23  19 - 32 mEq/L   Glucose, Bld 110 (*) 70 - 99 mg/dL   BUN 17  6 - 23 mg/dL   Creatinine, Ser 4.09  0.50 - 1.35 mg/dL   Calcium 9.5  8.4 - 81.1 mg/dL   Total Protein 7.0  6.0 - 8.3 g/dL   Albumin 4.1  3.5 - 5.2 g/dL   AST 21  0 - 37 U/L   ALT 13  0 - 53 U/L   Alkaline Phosphatase 83  39 - 117 U/L   Total Bilirubin 1.0  0.3 - 1.2 mg/dL   GFR calc non Af Amer 66 (*) >90 mL/min   GFR calc Af Amer 76 (*) >90 mL/min  TROPONIN I      Result Value Range   Troponin I <0.30  <0.30 ng/mL  URINALYSIS, ROUTINE W REFLEX MICROSCOPIC      Result Value Range   Color, Urine YELLOW  YELLOW   APPearance CLEAR  CLEAR   Specific Gravity, Urine 1.013  1.005 - 1.030   pH 7.0  5.0 - 8.0   Glucose, UA NEGATIVE  NEGATIVE mg/dL   Hgb urine dipstick NEGATIVE  NEGATIVE   Bilirubin Urine NEGATIVE  NEGATIVE   Ketones, ur NEGATIVE  NEGATIVE mg/dL   Protein, ur NEGATIVE  NEGATIVE mg/dL   Urobilinogen, UA 0.2  0.0 - 1.0 mg/dL   Nitrite NEGATIVE  NEGATIVE   Leukocytes, UA NEGATIVE  NEGATIVE   Dg Chest 2 View  09/05/2012   *RADIOLOGY REPORT*  Clinical Data: Weakness.  CHEST - 2 VIEW  Comparison: 06/30/2012.  Findings: The cardiac silhouette, mediastinal and hilar contours are stable.  Stable surgical changes from bypass surgery.  The lungs are clear.  No pleural effusion.  The bony thorax is intact.  IMPRESSION: No acute cardiopulmonary findings.  No change since prior examinations.   Original Report Authenticated By: Rudie Meyer, M.D.   Ct Head Wo Contrast  09/05/2012   *RADIOLOGY REPORT*  Clinical Data: Altered mental status  CT HEAD WITHOUT CONTRAST  Technique:  Contiguous axial images were obtained from the base of the skull through the vertex without contrast.  Comparison: None.  Findings: No skull fracture is noted. Postsurgical changes are noted right mastoid. Partial opacification of the left mastoid air cells. Mucosal thickening noted left maxillary sinus.  Postsurgical changes left  maxillary sinus medially.  No intracranial hemorrhage, mass effect or midline shift.  No acute infarction.  No mass lesion is noted on this unenhanced scan.  Mild cerebral atrophy.  Mild periventricular white matter decreased attenuation probable due to chronic small vessel ischemic changes. Small lacunar infarct is noted in the left thalamus.  IMPRESSION: 1.  No acute intracranial abnormality.  Mild cerebral atrophy. Small lacunar infarct in the left thalamus.  Sinuses disease as described above.   Original Report Authenticated By: Natasha Mead, M.D.       MDM  Iv ns. Labs. Ecg.  Reviewed nursing notes and prior charts for additional history.    Date: 09/05/2012  Rate: 69  Rhythm: normal sinus rhythm  QRS Axis: normal  Intervals: normal  ST/T Wave abnormalities: nonspecific T wave changes  Conduction Disutrbances:none  Narrative Interpretation:   Old EKG Reviewed: changes noted  Recheck labs/ct back - discussed w pt/family - pt requests d/c home, states he feels at baseline.  Pt/family indicate cardiologist is  [facility]-  They indicate will follow up there closely.  Discussed pt with pcp office, they also can f/u closely.  Pt continues to request d/c to home, denies any c/o. Pt appears stable for d/c.            Suzi Roots, MD 09/05/12 [phone]

## 2012-09-05 NOTE — Patient Instructions (Signed)
Go to ER for CT scan of head and blood work We will follow-up with you after evaluation

## 2012-09-05 NOTE — Assessment & Plan Note (Signed)
Concern for increased malaise and fatigue along with history of her. I see no signs of overt infection. Urinalysis is normal. His blood pressure is stable. He is not orthostatic. Labs will be done in ER with the evaluation

## 2012-09-05 NOTE — ED Notes (Signed)
Per EMS- pt was at PCP for trembing that he has been having for 2-3 days. Pt also reports N/V and diaphoresis recently. Denies chest pain or SOB. Pt was noted to have ST changes at PCP office today. Pt reports increased stress at home. Pt BP 138/87. CBG 112.

## 2012-09-05 NOTE — Assessment & Plan Note (Signed)
No signs of hypotension,no BP meds taken this AM

## 2012-09-06 ENCOUNTER — Ambulatory Visit: Payer: Medicare PPO | Admitting: Family Medicine

## 2012-09-07 ENCOUNTER — Ambulatory Visit (INDEPENDENT_AMBULATORY_CARE_PROVIDER_SITE_OTHER): Payer: Medicare PPO | Admitting: Family Medicine

## 2012-09-07 ENCOUNTER — Encounter: Payer: Self-pay | Admitting: Family Medicine

## 2012-09-07 VITALS — BP 138/80 | HR 88 | Temp 97.8°F | Resp 20 | Wt 177.0 lb

## 2012-09-07 DIAGNOSIS — F411 Generalized anxiety disorder: Secondary | ICD-10-CM

## 2012-09-07 DIAGNOSIS — R259 Unspecified abnormal involuntary movements: Secondary | ICD-10-CM

## 2012-09-07 DIAGNOSIS — R251 Tremor, unspecified: Secondary | ICD-10-CM

## 2012-09-07 DIAGNOSIS — R9431 Abnormal electrocardiogram [ECG] [EKG]: Secondary | ICD-10-CM

## 2012-09-07 DIAGNOSIS — R079 Chest pain, unspecified: Secondary | ICD-10-CM

## 2012-09-07 MED ORDER — VENLAFAXINE HCL ER 75 MG PO CP24: 75.0000 mg | ORAL_CAPSULE | Freq: Every day | ORAL | Status: DC

## 2012-09-07 NOTE — Progress Notes (Signed)
Subjective:    Patient ID: Evan [name], male    DOB: [birth year], 79 y.o.   MRN: [id number]  HPI Patient was seen in the emergency room yesterday due to EKG changes. He was found to have nonspecific ST depression in V5 and V6.  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 He is currently on Plavix due to his history of coronary artery disease. He originally presented due to feeling anxious and "shaky."  He is currently taking venlafaxine X. are 37.5 mg by mouth every morning, Ativan 1 mg in the morning and one half milligram in the afternoon and Restoril 15 mg at night for insomnia. His wife dealing with dementia. He is a constant caregiver. This causes a tremendous amount of stress to him. I really believe that his shakiness and tremor is due to anxiety. Since yesterday he was given aspirin and tremor stopped. He stated made him feel so much better after he had the aspirin.  I reviewed the emergency room records. His EKG in the emergency room shows nonspecific ST changes in lead 1 and aVL and V4 V5. It also appears as though he may be in atrial fibrillation. There is an irregular R. to R. interval with no definite P-wave.  He does not have a history of atrial fibrillation. EKG performed in the emergency room in March earlier this year says normal sinus rhythm.  Past Medical History  Diagnosis Date  . CAD (coronary artery disease)   . BPH (benign prostatic hyperplasia)   . Arthritis   . Asthma   . GERD (gastroesophageal reflux disease)   . Hyperlipidemia   . Hypertension    Current Outpatient Prescriptions on File Prior to Visit  Medication Sig Dispense Refill  . alendronate (FOSAMAX) 70 MG tablet Take 70 mg by mouth once a week. Take with a full glass of water on an empty stomach.      . clopidogrel (PLAVIX) 75 MG tablet Take 75 mg by mouth daily.        Marland Kitchen ezetimibe (ZETIA) 10 MG tablet Take 10 mg by mouth daily.        . fluticasone-salmeterol (ADVAIR HFA) 115-21 MCG/ACT inhaler Inhale  2 puffs into the lungs 2 (two) times daily.        . folic acid (FOLVITE) 1 MG tablet Take 1 mg by mouth daily.      Marland Kitchen LORazepam (ATIVAN) 1 MG tablet Take 0.5-1 mg by mouth every 8 (eight) hours as needed for anxiety. Take 1 tablet every morning and take a half-tablet for afternoon doses.      . methotrexate (RHEUMATREX) 2.5 MG tablet Take 20 mg by mouth 3 (three) times a week.       . nitroGLYCERIN (NITROSTAT) 0.4 MG SL tablet Place 0.4 mg under the tongue every 5 (five) minutes as needed for chest pain.      Marland Kitchen oxyCODONE-acetaminophen (PERCOCET/ROXICET) 5-325 MG per tablet Take 1 tablet by mouth every 6 (six) hours as needed for pain.      . pantoprazole (PROTONIX) 40 MG tablet Take 40 mg by mouth daily.      . ramipril (ALTACE) 5 MG tablet Take 5 mg by mouth daily.        . rosuvastatin (CRESTOR) 5 MG tablet Take 5 mg by mouth daily.      . temazepam (RESTORIL) 15 MG capsule Take 15 mg by mouth at bedtime as needed for sleep.      . traMADol (  ULTRAM) 50 MG tablet Take 50 mg by mouth every 6 (six) hours as needed for pain.       Marland Kitchen venlafaxine (EFFEXOR) 37.5 MG tablet Take 37.5 mg by mouth daily.       No current facility-administered medications on file prior to visit.   Allergies  Allergen Reactions  . Doxycycline Other (See Comments)    REACTION:  unknown  . Feldene (Piroxicam) Other (See Comments)    REACTION: Blisters  . Valium Other (See Comments)    REACTION:  unknown  . Tequin Anxiety   History   Social History  . Marital Status: Married    Spouse Name: N/A    Number of Children: N/A  . Years of Education: N/A   Occupational History  . Not on file.   Social History Main Topics  . Smoking status: Never Smoker   . Smokeless tobacco: Former Neurosurgeon  . Alcohol Use: No  . Drug Use: No  . Sexually Active:    Other Topics Concern  . Not on file   Social History Narrative  . No narrative on file      Review of Systems  All other systems reviewed and are  negative.       Objective:   Physical Exam  Vitals reviewed. Constitutional: He is oriented to person, place, and time.  Neck: Neck supple. No JVD present. No thyromegaly present.  Cardiovascular: Normal rate, regular rhythm, normal heart sounds and intact distal pulses.   No murmur heard. Pulmonary/Chest: Effort normal and breath sounds normal. No respiratory distress. He has no wheezes. He has no rales. He exhibits no tenderness.  Lymphadenopathy:    He has no cervical adenopathy.  Neurological: He is alert and oriented to person, place, and time. He displays normal reflexes. No cranial nerve deficit. He exhibits normal muscle tone. Coordination normal.   EKG today in the office shows normal sinus rhythm with 86 beats per minute with normal intervals and normal axis. There is a definite P-wave in a normal RR interval although he does have an occasional PAC. He continues to have nonspecific ST changes in lead 1 and aVL.       Assessment & Plan:  1. Chest pain, unspecified This is an erroneous entry. The EKG was done for followup of his irregular heart rhythm in the emergency room. - EKG 12-Lead  2. EKG abnormalities He continues to have nonspecific ST changes in the lateral leads. Given his history of coronary artery disease I think appropriate for him to followup with his cardiologist. His employment see Dr. Jim Like on Tuesday. He daily office shows normal sinus rhythm. EKG in emergency room is difficult to interpret but appears to be a fibrillation rather than normal sinus rhythm with frequent PACs. I cannot find a definite P-wave and there is an irregular RR interval. Given his history of a stroke I believe the patient would benefit from wearing a event monitor to characterize if he is truly having paroxysmal atrial fibrillation. He is dispense paroxysmal atrial fibrillation he may benefit from being on eliquis.  For the meantime I will continue Plavix 75 mg until he is had a chance  to see his cardiologist.  3. Tremor See problem 4.  4. GAD (generalized anxiety disorder) I recommended increasing the venlafaxine extended release to 75 mg by mouth every morning. He is to continue taking Ativan and Restoril as needed. Followup in one month.

## 2012-09-10 ENCOUNTER — Other Ambulatory Visit (INDEPENDENT_AMBULATORY_CARE_PROVIDER_SITE_OTHER): Payer: Medicare PPO | Admitting: Family Medicine

## 2012-09-10 DIAGNOSIS — R079 Chest pain, unspecified: Secondary | ICD-10-CM

## 2012-09-12 ENCOUNTER — Telehealth: Payer: Self-pay | Admitting: Family Medicine

## 2012-09-12 NOTE — Telephone Encounter (Signed)
Ok to refill 

## 2012-09-12 NOTE — Telephone Encounter (Signed)
?   OK to Refill  

## 2012-09-14 ENCOUNTER — Other Ambulatory Visit: Payer: Self-pay | Admitting: Family Medicine

## 2012-09-14 MED ORDER — LORAZEPAM 1 MG PO TABS: 0.5000 mg | ORAL_TABLET | Freq: Three times a day (TID) | ORAL | Status: DC | PRN

## 2012-09-14 NOTE — Telephone Encounter (Signed)
Rx Refilled  

## 2012-09-14 NOTE — Telephone Encounter (Signed)
DONE

## 2012-10-08 ENCOUNTER — Ambulatory Visit (INDEPENDENT_AMBULATORY_CARE_PROVIDER_SITE_OTHER): Payer: Medicare PPO | Admitting: Physician Assistant

## 2012-10-08 ENCOUNTER — Encounter: Payer: Self-pay | Admitting: Physician Assistant

## 2012-10-08 VITALS — BP 118/72 | HR 96 | Temp 98.0°F | Resp 20 | Wt 178.0 lb

## 2012-10-08 DIAGNOSIS — F419 Anxiety disorder, unspecified: Secondary | ICD-10-CM | POA: Insufficient documentation

## 2012-10-08 DIAGNOSIS — F411 Generalized anxiety disorder: Secondary | ICD-10-CM

## 2012-10-08 DIAGNOSIS — N400 Enlarged prostate without lower urinary tract symptoms: Secondary | ICD-10-CM | POA: Insufficient documentation

## 2012-10-08 LAB — PSA, MEDICARE: PSA: 1.81 ng/mL (ref <=4.00)

## 2012-10-08 MED ORDER — TAMSULOSIN HCL 0.4 MG PO CAPS: 0.4000 mg | ORAL_CAPSULE | Freq: Every day | ORAL | Status: DC

## 2012-10-08 NOTE — Progress Notes (Signed)
Patient ID: Evan [name] MRN: [id number], DOB: [birth year], 79 y.o. Date of Encounter: @DATE @  Chief Complaint:  Chief Complaint  Patient presents with  . c/o still not feeling better  "can't go on this way"    HPI: 79 y.o. year old white male  presents for f/u. He has had multiple encounters recently, which are outlined below.  06/14/12: OV with Dr. Tanya Nones: Fatigue, weakness for 2 weeks. EKG showed brady in 50s. Atenolol decreased 50 to 25mg  QD.  06/21/12: OV with Dr. Tanya Nones: D/Ced Atenolol all together  09/05/12: OV with Dr. Jeanice Lim: Reported weak, fatigue, tremor, blurry vision, recent CP. She had him transferred to ER via [facility].  09/05/12: ER:   Labs: CBC, CMET, Cardiac Enzymes, UA all normal  CXR: Normal  Head CT: Old CVA. No acute findings.  EKG: No significant abnormality  09/07/12: OV with Dr. Tanya Nones:   He recommended f/u with Cardiologist to be certain no recurrent PAF etc. Pt says he did see Cardiologist and that he did no further tests, made no medication changes. "Said he'd see me in a year."  Dr. Demetrius Charity also increased Effexor from 37.5 to 75mg . 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 He felt that pts symptoms related to stress.   Today pt says he only took the Effexor 75 mg QD for about 7 days. Then, he talked to his pharmacist b/c pt felt worse on hte increased dose. His pharmacist recommended he alternate the 37.5 and 75mg  QOD so this is what he has been taking.  Says he feels weak and tired. Also adds that he isnot sleeping  Good. Says he is able to fall asleep but wakes every 2 hours-"because has to urinate and also b/c he has to check on his wife-she wears 'diaper' and if he doesn't change it in hte night, her skin will break down.' "He is taking the Temazepam (Restoril 15mg  ) QHS.     Past Medical History  Diagnosis Date  . CAD (coronary artery disease)   . BPH (benign prostatic hyperplasia)   . Arthritis   . Asthma   . GERD (gastroesophageal reflux disease)   .  Hyperlipidemia   . Hypertension   . Anxiety      Home Meds: See attached medication section for current medication list. Any medications entered into computer today will not appear on this note's list. The medications listed below were entered prior to today. Current Outpatient Prescriptions on File Prior to Visit  Medication Sig Dispense Refill  . alendronate (FOSAMAX) 70 MG tablet Take 70 mg by mouth once a week. Take with a full glass of water on an empty stomach.      . clopidogrel (PLAVIX) 75 MG tablet Take 75 mg by mouth daily.        Marland Kitchen ezetimibe (ZETIA) 10 MG tablet Take 10 mg by mouth daily.        . fluticasone-salmeterol (ADVAIR HFA) 115-21 MCG/ACT inhaler Inhale 2 puffs into the lungs 2 (two) times daily.        . folic acid (FOLVITE) 1 MG tablet Take 1 mg by mouth daily.      Marland Kitchen LORazepam (ATIVAN) 1 MG tablet Take 0.5-1 tablets (0.5-1 mg total) by mouth every 8 (eight) hours as needed for anxiety. Take 1 tablet every morning and take a half-tablet for afternoon doses.  60 tablet  0  . methotrexate (RHEUMATREX) 2.5 MG tablet Take 20 mg by mouth 3 (three) times a week.       Marland Kitchen  nitroGLYCERIN (NITROSTAT) 0.4 MG SL tablet Place 0.4 mg under the tongue every 5 (five) minutes as needed for chest pain.      . pantoprazole (PROTONIX) 40 MG tablet Take 40 mg by mouth daily.      . ramipril (ALTACE) 5 MG tablet Take 5 mg by mouth daily.        . rosuvastatin (CRESTOR) 5 MG tablet Take 5 mg by mouth daily.      . temazepam (RESTORIL) 15 MG capsule Take 15 mg by mouth at bedtime as needed for sleep.      . traMADol (ULTRAM) 50 MG tablet Take 50 mg by mouth every 6 (six) hours as needed for pain.       Marland Kitchen venlafaxine (EFFEXOR) 37.5 MG tablet Take 37.5 mg by mouth daily.      Marland Kitchen oxyCODONE-acetaminophen (PERCOCET/ROXICET) 5-325 MG per tablet Take 1 tablet by mouth every 6 (six) hours as needed for pain.      Marland Kitchen venlafaxine XR (EFFEXOR-XR) 75 MG 24 hr capsule Take 1 capsule (75 mg total) by mouth  daily.  30 capsule  5   No current facility-administered medications on file prior to visit.    Allergies:  Allergies  Allergen Reactions  . Doxycycline Other (See Comments)    REACTION:  unknown  . Feldene (Piroxicam) Other (See Comments)    REACTION: Blisters  . Valium Other (See Comments)    REACTION:  unknown  . Tequin Anxiety    History   Social History  . Marital Status: Married    Spouse Name: N/A    Number of Children: N/A  . Years of Education: N/A   Occupational History  . Not on file.   Social History Main Topics  . Smoking status: Never Smoker   . Smokeless tobacco: Former Neurosurgeon  . Alcohol Use: No  . Drug Use: No  . Sexually Active:    Other Topics Concern  . Not on file   Social History Narrative  . No narrative on file    No family history on file.   Review of Systems:  See HPI for pertinent ROS. All other ROS negative.    Physical Exam: Blood pressure 118/72, pulse 96, temperature 98 F (36.7 C), temperature source Oral, resp. rate 20, weight 178 lb (80.74 kg)., Body mass index is 31.03 kg/(m^2). General:WNWD WM. Appears stressed, anxious, uptight. Appears in no acute distress. Neck: Supple. No thyromegaly. No lymphadenopathy.No carotid bruit. Lungs: Clear bilaterally to auscultation without wheezes, rales, or rhonchi. Breathing is unlabored. Heart: RRR with S1 S2. No murmurs, rubs, or gallops. Abdomen: Soft, non-tender, non-distended with normoactive bowel sounds. No hepatomegaly. No rebound/guarding. No obvious abdominal masses. Musculoskeletal:  Strength and tone normal for age. Extremities/Skin: Warm and dry. No edema Neuro: Alert and oriented X 3. Moves all extremities spontaneously. Gait is normal. CNII-XII grossly in tact. Psych:  Responds to questions appropriately with a normal affect.     ASSESSMENT AND PLAN:  79 y.o. year old male with   Fatigue/weakness/feeling terrible: I have reassured pt of all the tests that have been  done.  Discussed that labs showed no anemia, no electrolyte abnormality, etc.  Discussed EKG, Cardiology visit.  Discusssed CXR and Head CT findings.  Reassured him that all this eval has shown no acute abnormality. Discussed with him that it is felt that he feels bad sec to stress, anxiety, insomnia. Discussed the medications that are available to treat this and the fact that it takes weeks  to see the effect of SSRI/SNRIs. I am sorry for his frustration and that it will take time before he feels better. He needs to take the Effexor XR 75 mg Every Day. Need to give it 6 weeks to see effect. Cont Lorazepam and Restoril as they are. I asked him if he feeels better after he takes the lorazepam and he says that right after he takes it he  Feels even weaker but then several hours later he feels better. So, I donot think we shoud increase the dose or frequency of that.  I do think he would feel a lot better if he got more sleep. i dont know if he is truly waking b/c he need to urinate or if he wakse then decides he needs to urinate. Assuming the first scenario, I will give him flomax to help with BPH/nocturia. Last PSA was 5/13-nml. Will check PSA to make sur ehtis is normal. Will start flomax to hopefully reduce nocturia and in turn help him get more sleep.   Will have him sched f/u OV 6 weeks, sooner if feels worse.   1. Anxiety Take Effexor XR 75mg  Daily  2. BPH (benign prostatic hyperplasia) - PSA, Medicare - tamsulosin (FLOMAX) 0.4 MG CAPS; Take 1 capsule (0.4 mg total) by mouth daily.  Dispense: 30 capsule; Refill: 3   Signed, [address] [place], [place], [place] 10/08/2012 8:54 AM

## 2012-10-09 ENCOUNTER — Encounter: Payer: Self-pay | Admitting: Family Medicine

## 2012-10-11 ENCOUNTER — Telehealth: Payer: Self-pay | Admitting: Family Medicine

## 2012-10-11 NOTE — Telephone Encounter (Signed)
Pt called and made aware of lab results.  

## 2012-10-11 NOTE — Telephone Encounter (Signed)
Message copied by Donne Anon on Thu Oct 11, 2012  9:55 AM ------      Message from: Allayne Butcher      Created: Tue Oct 09, 2012  3:18 PM       Pt had OV 10/08/12. He c/o waking Q 2 hrs to urinate. I started him on med for BPH. It had been 1 year since last PSA was done so I did PSA to r/o prostate cancer. Tell him lab is normal and take med as planned at the OV ------

## 2012-10-30 ENCOUNTER — Telehealth: Payer: Self-pay | Admitting: Family Medicine

## 2012-10-30 MED ORDER — LORAZEPAM 1 MG PO TABS: 0.5000 mg | ORAL_TABLET | Freq: Three times a day (TID) | ORAL | Status: DC | PRN

## 2012-10-30 NOTE — Telephone Encounter (Signed)
?   OK to Refill  

## 2012-10-30 NOTE — Telephone Encounter (Signed)
Ok to refill 

## 2012-10-30 NOTE — Telephone Encounter (Signed)
Printed and faxed to pharm

## 2012-12-07 ENCOUNTER — Ambulatory Visit (INDEPENDENT_AMBULATORY_CARE_PROVIDER_SITE_OTHER): Payer: Medicare PPO | Admitting: Family Medicine

## 2012-12-07 ENCOUNTER — Encounter: Payer: Self-pay | Admitting: Family Medicine

## 2012-12-07 VITALS — BP 90/50 | HR 96 | Temp 97.8°F | Resp 18 | Wt 178.0 lb

## 2012-12-07 DIAGNOSIS — R5381 Other malaise: Secondary | ICD-10-CM

## 2012-12-07 DIAGNOSIS — R5383 Other fatigue: Secondary | ICD-10-CM

## 2012-12-07 NOTE — Progress Notes (Signed)
Subjective:    Patient ID: Evan [name], male    DOB: 1927-06-21, 79 y.o.   MRN: [id number]  HPI Patient comes in with generalized malaise, fatigue, and weakness. He states he feels tired and has no energy. He is anxious and under tremendous amount of stress caring for his wife who has severe dementia. He denies chest pain, shortness of breath, dyspnea on exertion. He denies cough. He denies nausea vomiting or diarrhea. He does complain of weakness in his legs and his arms and easy fatigability. He denies dysuria, frequency, urgency or hematuria. He denies any skin rashes.  He also has polypharmacy and taking Ativan during the day for anxiety and temazepam at night to help him sleep which is contributing to his fatigue but he is unwilling to discontinue medication. Past Medical History  Diagnosis Date  . CAD (coronary artery disease)   . BPH (benign prostatic hyperplasia)   . Arthritis   . Asthma   . GERD (gastroesophageal reflux disease)   . Hyperlipidemia   . Hypertension   . Anxiety    Past Surgical History  Procedure Laterality Date  . Cholecystectomy    . Rotator cuff repair    . Knee surgery    . Coronary artery bypass graft     Current Outpatient Prescriptions on File Prior to Visit  Medication Sig Dispense Refill  . alendronate (FOSAMAX) 70 MG tablet Take 70 mg by mouth once a week. Take with a full glass of water on an empty stomach.      . clopidogrel (PLAVIX) 75 MG tablet Take 75 mg by mouth daily.        Marland Kitchen ezetimibe (ZETIA) 10 MG tablet Take 10 mg by mouth daily.        . fluticasone-salmeterol (ADVAIR HFA) 115-21 MCG/ACT inhaler Inhale 2 puffs into the lungs 2 (two) times daily.        . folic acid (FOLVITE) 1 MG tablet Take 1 mg by mouth daily.      Marland Kitchen LORazepam (ATIVAN) 1 MG tablet Take 0.5-1 tablets (0.5-1 mg total) by mouth every 8 (eight) hours as needed for anxiety. Take 1 tablet every morning and take a half-tablet for afternoon doses.  60 tablet  0  .  methotrexate (RHEUMATREX) 2.5 MG tablet Take 20 mg by mouth 3 (three) times a week.       . nitroGLYCERIN (NITROSTAT) 0.4 MG SL tablet Place 0.4 mg under the tongue every 5 (five) minutes as needed for chest pain.      . pantoprazole (PROTONIX) 40 MG tablet Take 40 mg by mouth daily.      . ramipril (ALTACE) 5 MG tablet Take 5 mg by mouth daily.        . rosuvastatin (CRESTOR) 5 MG tablet Take 5 mg by mouth daily.      . tamsulosin (FLOMAX) 0.4 MG CAPS Take 1 capsule (0.4 mg total) by mouth daily.  30 capsule  3  . temazepam (RESTORIL) 15 MG capsule Take 15 mg by mouth at bedtime as needed for sleep.      Marland Kitchen venlafaxine XR (EFFEXOR-XR) 75 MG 24 hr capsule Take 1 capsule (75 mg total) by mouth daily.  30 capsule  5   No current facility-administered medications on file prior to visit.   Allergies  Allergen Reactions  . Doxycycline Other (See Comments)    REACTION:  unknown  . Feldene [Piroxicam] Other (See Comments)    REACTION: Blisters  . Valium Other (See  Comments)    REACTION:  unknown  . Tequin Anxiety   History   Social History  . Marital Status: Married    Spouse Name: N/A    Number of Children: N/A  . Years of Education: N/A   Occupational History  . Not on file.   Social History Main Topics  . Smoking status: Never Smoker   . Smokeless tobacco: Former Neurosurgeon  . Alcohol Use: No  . Drug Use: No  . Sexual Activity:    Other Topics Concern  . Not on file   Social History Narrative  . No narrative on file      Review of Systems  All other systems reviewed and are negative.       Objective:   Physical Exam  Vitals reviewed. Constitutional: He is oriented to person, place, and time. He appears well-developed and well-nourished. No distress.  HENT:  Head: Normocephalic.  Right Ear: External ear normal.  Left Ear: External ear normal.  Nose: Nose normal.  Mouth/Throat: Oropharynx is clear and moist. No oropharyngeal exudate.  Eyes: Conjunctivae are normal.  Pupils are equal, round, and reactive to light. Right eye exhibits no discharge. Left eye exhibits no discharge. No scleral icterus.  Neck: Neck supple. No JVD present. No thyromegaly present.  Cardiovascular: Normal rate, regular rhythm and normal heart sounds.  Exam reveals no gallop and no friction rub.   No murmur heard. Pulmonary/Chest: Effort normal and breath sounds normal. No respiratory distress. He has no wheezes. He has no rales. He exhibits no tenderness.  Abdominal: Soft. Bowel sounds are normal. He exhibits no distension. There is no tenderness. There is no rebound and no guarding.  Musculoskeletal: He exhibits no edema.  Lymphadenopathy:    He has no cervical adenopathy.  Neurological: He is alert and oriented to person, place, and time. He has normal reflexes. He displays normal reflexes. No cranial nerve deficit. He exhibits normal muscle tone. Coordination normal.  Skin: Skin is warm. No rash noted. He is not diaphoretic. No erythema. No pallor.  Psychiatric: He has a normal mood and affect. His behavior is normal. Judgment and thought content normal.          Assessment & Plan:  1. Other malaise and fatigue This is likely multifactorial. The patient's blood pressure is low today at 90/60. Therefore I will have the patient temporarily discontinue altace. I have also asked him to drink more fluids in case he is dehydrated. I will recheck his blood pressure and reevaluate the patient next week. Meanwhile, I will check a CBC, TSH, CMP. I also believe stress and anxiety is a contributing factor to his fatigue especially with the stress of caring for his wife.  Recheck next week or sooner if worse. - CBC with Differential - COMPLETE METABOLIC PANEL WITH GFR - TSH

## 2012-12-08 LAB — CBC WITH DIFFERENTIAL/PLATELET
Basophils Absolute: 0 10*3/uL (ref 0.0–0.1)
Basophils Relative: 0 % (ref 0–1)
Eosinophils Absolute: 0 10*3/uL (ref 0.0–0.7)
Eosinophils Relative: 0 % (ref 0–5)
HCT: 39 % (ref 39.0–52.0)
Hemoglobin: 13.3 g/dL (ref 13.0–17.0)
Lymphocytes Relative: 19 % (ref 12–46)
Lymphs Abs: 1.4 10*3/uL (ref 0.7–4.0)
MCH: 32.2 pg (ref 26.0–34.0)
MCHC: 34.1 g/dL (ref 30.0–36.0)
MCV: 94.4 fL (ref 78.0–100.0)
Monocytes Absolute: 0.4 10*3/uL (ref 0.1–1.0)
Monocytes Relative: 5 % (ref 3–12)
Neutro Abs: 5.4 10*3/uL (ref 1.7–7.7)
Neutrophils Relative %: 76 % (ref 43–77)
Platelets: 185 10*3/uL (ref 150–400)
RBC: 4.13 MIL/uL — ABNORMAL LOW (ref 4.22–5.81)
RDW: 14.1 % (ref 11.5–15.5)
WBC: 7.3 10*3/uL (ref 4.0–10.5)

## 2012-12-08 LAB — TSH: TSH: 1.396 u[IU]/mL (ref 0.350–4.500)

## 2012-12-08 LAB — COMPLETE METABOLIC PANEL WITH GFR
ALT: 12 U/L (ref 0–53)
AST: 18 U/L (ref 0–37)
Albumin: 3.8 g/dL (ref 3.5–5.2)
Alkaline Phosphatase: 71 U/L (ref 39–117)
BUN: 23 mg/dL (ref 6–23)
CO2: 22 mEq/L (ref 19–32)
Calcium: 9.1 mg/dL (ref 8.4–10.5)
Chloride: 105 mEq/L (ref 96–112)
Creat: 1.09 mg/dL (ref 0.50–1.35)
GFR, Est African American: 72 mL/min
GFR, Est Non African American: 62 mL/min
Glucose, Bld: 96 mg/dL (ref 70–99)
Potassium: 4.5 mEq/L (ref 3.5–5.3)
Sodium: 138 mEq/L (ref 135–145)
Total Bilirubin: 0.8 mg/dL (ref 0.3–1.2)
Total Protein: 6 g/dL (ref 6.0–8.3)

## 2012-12-12 ENCOUNTER — Telehealth: Payer: Self-pay | Admitting: Family Medicine

## 2012-12-12 NOTE — Telephone Encounter (Signed)
?   OK to Refill  

## 2012-12-12 NOTE — Telephone Encounter (Signed)
Lorazepam 1 mg tab 1 QAM and 1/2 tab for afternoon doses #60 last rf 11/12/12

## 2012-12-13 MED ORDER — LORAZEPAM 1 MG PO TABS: ORAL_TABLET | ORAL | Status: DC

## 2012-12-13 NOTE — Addendum Note (Signed)
Addended by: Legrand Rams B on: 12/13/2012 01:09 PM   Modules accepted: Orders

## 2012-12-13 NOTE — Telephone Encounter (Signed)
ok 

## 2012-12-13 NOTE — Telephone Encounter (Signed)
rx was printed and faxed to pharmacy 

## 2012-12-14 ENCOUNTER — Encounter: Payer: Medicare PPO | Admitting: Family Medicine

## 2012-12-14 NOTE — Progress Notes (Signed)
Subjective:    Patient ID: Evan [name], male    DOB: Jan 20, 1928, 79 y.o.   MRN: [id number]  HPI 12/07/12 Patient comes in with generalized malaise, fatigue, and weakness. He states he feels tired and has no energy. He is anxious and under tremendous amount of stress caring for his wife who has severe dementia. He denies chest pain, shortness of breath, dyspnea on exertion. He denies cough. He denies nausea vomiting or diarrhea. He does complain of weakness in his legs and his arms and easy fatigability. He denies dysuria, frequency, urgency or hematuria. He denies any skin rashes.  He also has polypharmacy and taking Ativan during the day for anxiety and temazepam at night to help him sleep which is contributing to his fatigue but he is unwilling to discontinue medication.  Therefore, my plan at that time was: 1. Other malaise and fatigue This is likely multifactorial. The patient's blood pressure is low today at 90/60. Therefore I will have the patient temporarily discontinue altace. I have also asked him to drink more fluids in case he is dehydrated. I will recheck his blood pressure and reevaluate the patient next week. Meanwhile, I will check a CBC, TSH, CMP. I also believe stress and anxiety is a contributing factor to his fatigue especially with the stress of caring for his wife.  Recheck next week or sooner if worse. - CBC with Differential - COMPLETE METABOLIC PANEL WITH GFR - TSH No visits with results within 1 Week(s) from this visit. Latest known visit with results is:  Office Visit on 12/07/2012  Component Date Value Range Status  . WBC 12/07/2012 7.3  4.0 - 10.5 K/uL Final  . RBC 12/07/2012 4.13* 4.22 - 5.81 MIL/uL Final  . Hemoglobin 12/07/2012 13.3  13.0 - 17.0 g/dL Final  . HCT 40/98/1191 39.0  39.0 - 52.0 % Final  . MCV 12/07/2012 94.4  78.0 - 100.0 fL Final  . MCH 12/07/2012 32.2  26.0 - 34.0 pg Final  . MCHC 12/07/2012 34.1  30.0 - 36.0 g/dL Final  . RDW 47/82/9562 14.1   11.5 - 15.5 % Final  . Platelets 12/07/2012 185  150 - 400 K/uL Final  . Neutrophils Relative % 12/07/2012 76  43 - 77 % Final  . Neutro Abs 12/07/2012 5.4  1.7 - 7.7 K/uL Final  . Lymphocytes Relative 12/07/2012 19  12 - 46 % Final  . Lymphs Abs 12/07/2012 1.4  0.7 - 4.0 K/uL Final  . Monocytes Relative 12/07/2012 5  3 - 12 % Final  . Monocytes Absolute 12/07/2012 0.4  0.1 - 1.0 K/uL Final  . Eosinophils Relative 12/07/2012 0  0 - 5 % Final  . Eosinophils Absolute 12/07/2012 0.0  0.0 - 0.7 K/uL Final  . Basophils Relative 12/07/2012 0  0 - 1 % Final  . Basophils Absolute 12/07/2012 0.0  0.0 - 0.1 K/uL Final  . Smear Review 12/07/2012 Criteria for review not met   Final  . Sodium 12/07/2012 138  135 - 145 mEq/L Final  . Potassium 12/07/2012 4.5  3.5 - 5.3 mEq/L Final  . Chloride 12/07/2012 105  96 - 112 mEq/L Final  . CO2 12/07/2012 22  19 - 32 mEq/L Final  . Glucose, Bld 12/07/2012 96  70 - 99 mg/dL Final  . BUN 13/11/6576 23  6 - 23 mg/dL Final  . Creat 46/96/2952 1.09  0.50 - 1.35 mg/dL Final  . Total Bilirubin 12/07/2012 0.8  0.3 - 1.2 mg/dL Final  .  Alkaline Phosphatase 12/07/2012 71  39 - 117 U/L Final  . AST 12/07/2012 18  0 - 37 U/L Final  . ALT 12/07/2012 12  0 - 53 U/L Final  . Total Protein 12/07/2012 6.0  6.0 - 8.3 g/dL Final  . Albumin 56/21/3086 3.8  3.5 - 5.2 g/dL Final  . Calcium 57/84/6962 9.1  8.4 - 10.5 mg/dL Final  . GFR, Est African American 12/07/2012 72   Final  . GFR, Est Non African American 12/07/2012 62   Final   Comment:                            The estimated GFR is a calculation valid for adults (>=36 years old)                          that uses the CKD-EPI algorithm to adjust for age and sex. It is                            not to be used for children, pregnant women, hospitalized patients,                             patients on dialysis, or with rapidly changing kidney function.                          According to the NKDEP, eGFR >89 is normal,  60-89 shows mild                          impairment, 30-59 shows moderate impairment, 15-29 shows severe                          impairment and <15 is ESRD.                             . TSH 12/07/2012 1.396  0.350 - 4.500 uIU/mL Final     Past Medical History  Diagnosis Date  . CAD (coronary artery disease)   . BPH (benign prostatic hyperplasia)   . Arthritis   . Asthma   . GERD (gastroesophageal reflux disease)   . Hyperlipidemia   . Hypertension   . Anxiety    Past Surgical History  Procedure Laterality Date  . Cholecystectomy    . Rotator cuff repair    . Knee surgery    . Coronary artery bypass graft     Current Outpatient Prescriptions on File Prior to Visit  Medication Sig Dispense Refill  . alendronate (FOSAMAX) 70 MG tablet Take 70 mg by mouth once a week. Take with a full glass of water on an empty stomach.      . clopidogrel (PLAVIX) 75 MG tablet Take 75 mg by mouth daily.        Marland Kitchen ezetimibe (ZETIA) 10 MG tablet Take 10 mg by mouth daily.        . fluticasone-salmeterol (ADVAIR HFA) 115-21 MCG/ACT inhaler Inhale 2 puffs into the lungs 2 (two) times daily.        . folic acid (FOLVITE) 1 MG tablet Take 1 mg by mouth daily.      Marland Kitchen LORazepam (ATIVAN) 1 MG tablet Take  1 tablet every morning and take a half-tablet for afternoon doses.  60 tablet  0  . methotrexate (RHEUMATREX) 2.5 MG tablet Take 20 mg by mouth 3 (three) times a week.       . nitroGLYCERIN (NITROSTAT) 0.4 MG SL tablet Place 0.4 mg under the tongue every 5 (five) minutes as needed for chest pain.      . pantoprazole (PROTONIX) 40 MG tablet Take 40 mg by mouth daily.      . ramipril (ALTACE) 5 MG tablet Take 5 mg by mouth daily.        . rosuvastatin (CRESTOR) 5 MG tablet Take 5 mg by mouth daily.      . tamsulosin (FLOMAX) 0.4 MG CAPS Take 1 capsule (0.4 mg total) by mouth daily.  30 capsule  3  . temazepam (RESTORIL) 15 MG capsule Take 15 mg by mouth at bedtime as needed for sleep.      Marland Kitchen venlafaxine  XR (EFFEXOR-XR) 75 MG 24 hr capsule Take 1 capsule (75 mg total) by mouth daily.  30 capsule  5   No current facility-administered medications on file prior to visit.   Allergies  Allergen Reactions  . Doxycycline Other (See Comments)    REACTION:  unknown  . Feldene [Piroxicam] Other (See Comments)    REACTION: Blisters  . Valium Other (See Comments)    REACTION:  unknown  . Tequin Anxiety   History   Social History  . Marital Status: Married    Spouse Name: N/A    Number of Children: N/A  . Years of Education: N/A   Occupational History  . Not on file.   Social History Main Topics  . Smoking status: Never Smoker   . Smokeless tobacco: Former Neurosurgeon  . Alcohol Use: No  . Drug Use: No  . Sexual Activity:    Other Topics Concern  . Not on file   Social History Narrative  . No narrative on file      Review of Systems  All other systems reviewed and are negative.       Objective:   Physical Exam  Vitals reviewed. Constitutional: He is oriented to person, place, and time. He appears well-developed and well-nourished. No distress.  HENT:  Head: Normocephalic.  Right Ear: External ear normal.  Left Ear: External ear normal.  Nose: Nose normal.  Mouth/Throat: Oropharynx is clear and moist. No oropharyngeal exudate.  Eyes: Conjunctivae are normal. Pupils are equal, round, and reactive to light. Right eye exhibits no discharge. Left eye exhibits no discharge. No scleral icterus.  Neck: Neck supple. No JVD present. No thyromegaly present.  Cardiovascular: Normal rate, regular rhythm and normal heart sounds.  Exam reveals no gallop and no friction rub.   No murmur heard. Pulmonary/Chest: Effort normal and breath sounds normal. No respiratory distress. He has no wheezes. He has no rales. He exhibits no tenderness.  Abdominal: Soft. Bowel sounds are normal. He exhibits no distension. There is no tenderness. There is no rebound and no guarding.  Musculoskeletal: He  exhibits no edema.  Lymphadenopathy:    He has no cervical adenopathy.  Neurological: He is alert and oriented to person, place, and time. He has normal reflexes. No cranial nerve deficit. He exhibits normal muscle tone. Coordination normal.  Skin: Skin is warm. No rash noted. He is not diaphoretic. No erythema. No pallor.  Psychiatric: He has a normal mood and affect. His behavior is normal. Judgment and thought content normal.  Assessment & Plan:   This encounter was created in error - please disregard.

## 2012-12-18 ENCOUNTER — Encounter: Payer: Self-pay | Admitting: Family Medicine

## 2012-12-18 ENCOUNTER — Ambulatory Visit (INDEPENDENT_AMBULATORY_CARE_PROVIDER_SITE_OTHER): Payer: Medicare PPO | Admitting: Family Medicine

## 2012-12-18 VITALS — BP 100/64 | HR 86 | Temp 97.2°F | Resp 16 | Ht 68.0 in | Wt 178.0 lb

## 2012-12-18 DIAGNOSIS — R5381 Other malaise: Secondary | ICD-10-CM

## 2012-12-18 DIAGNOSIS — Z23 Encounter for immunization: Secondary | ICD-10-CM

## 2012-12-18 DIAGNOSIS — R5383 Other fatigue: Secondary | ICD-10-CM

## 2012-12-18 NOTE — Progress Notes (Signed)
Subjective:    Patient ID: Evan [name], male    DOB: 1928/01/03, 79 y.o.   MRN: [id number]  HPI 12/07/12 Patient comes in with generalized malaise, fatigue, and weakness. He states he feels tired and has no energy. He is anxious and under tremendous amount of stress caring for his wife who has severe dementia. He denies chest pain, shortness of breath, dyspnea on exertion. He denies cough. He denies nausea vomiting or diarrhea. He does complain of weakness in his legs and his arms and easy fatigability. He denies dysuria, frequency, urgency or hematuria. He denies any skin rashes.  He also has polypharmacy and taking Ativan during the day for anxiety and temazepam at night to help him sleep which is contributing to his fatigue but he is unwilling to discontinue medication.  At that time, my plan was: 1. Other malaise and fatigue This is likely multifactorial. The patient's blood pressure is low today at 90/60. Therefore I will have the patient temporarily discontinue altace. I have also asked him to drink more fluids in case he is dehydrated. I will recheck his blood pressure and reevaluate the patient next week. Meanwhile, I will check a CBC, TSH, CMP. I also believe stress and anxiety is a contributing factor to his fatigue especially with the stress of caring for his wife.  Recheck next week or sooner if worse. - CBC with Differential - COMPLETE METABOLIC PANEL WITH GFR - TSH  12/18/12 Patient feels much better. His blood pressure is only slightly elevated at 100/64. His weight is stable. He has been trying to drink more fluids. Since his blood pressure has come up, the patient does not feel as fatigued. He denies any weakness today. He continues to do a lot of stress and anxiety came for his wife but otherwise is doing well.  Past Medical History  Diagnosis Date  . CAD (coronary artery disease)   . BPH (benign prostatic hyperplasia)   . Arthritis   . Asthma   . GERD (gastroesophageal reflux  disease)   . Hyperlipidemia   . Hypertension   . Anxiety    Past Surgical History  Procedure Laterality Date  . Cholecystectomy    . Rotator cuff repair    . Knee surgery    . Coronary artery bypass graft     Current Outpatient Prescriptions on File Prior to Visit  Medication Sig Dispense Refill  . alendronate (FOSAMAX) 70 MG tablet Take 70 mg by mouth once a week. Take with a full glass of water on an empty stomach.      . clopidogrel (PLAVIX) 75 MG tablet Take 75 mg by mouth daily.        Marland Kitchen ezetimibe (ZETIA) 10 MG tablet Take 10 mg by mouth daily.        . fluticasone-salmeterol (ADVAIR HFA) 115-21 MCG/ACT inhaler Inhale 2 puffs into the lungs 2 (two) times daily.        . folic acid (FOLVITE) 1 MG tablet Take 1 mg by mouth daily.      Marland Kitchen LORazepam (ATIVAN) 1 MG tablet Take 1 tablet every morning and take a half-tablet for afternoon doses.  60 tablet  0  . methotrexate (RHEUMATREX) 2.5 MG tablet Take 20 mg by mouth 3 (three) times a week.       . nitroGLYCERIN (NITROSTAT) 0.4 MG SL tablet Place 0.4 mg under the tongue every 5 (five) minutes as needed for chest pain.      . pantoprazole (PROTONIX) 40  MG tablet Take 40 mg by mouth daily.      . rosuvastatin (CRESTOR) 5 MG tablet Take 5 mg by mouth daily.      . tamsulosin (FLOMAX) 0.4 MG CAPS Take 1 capsule (0.4 mg total) by mouth daily.  30 capsule  3  . temazepam (RESTORIL) 15 MG capsule Take 15 mg by mouth at bedtime as needed for sleep.      Marland Kitchen venlafaxine XR (EFFEXOR-XR) 75 MG 24 hr capsule Take 1 capsule (75 mg total) by mouth daily.  30 capsule  5  . ramipril (ALTACE) 5 MG tablet Take 5 mg by mouth daily.         No current facility-administered medications on file prior to visit.   Allergies  Allergen Reactions  . Doxycycline Other (See Comments)    REACTION:  unknown  . Feldene [Piroxicam] Other (See Comments)    REACTION: Blisters  . Valium Other (See Comments)    REACTION:  unknown  . Tequin Anxiety   History    Social History  . Marital Status: Married    Spouse Name: N/A    Number of Children: N/A  . Years of Education: N/A   Occupational History  . Not on file.   Social History Main Topics  . Smoking status: Never Smoker   . Smokeless tobacco: Former Neurosurgeon  . Alcohol Use: No  . Drug Use: No  . Sexual Activity:    Other Topics Concern  . Not on file   Social History Narrative  . No narrative on file      Review of Systems  All other systems reviewed and are negative.       Objective:   Physical Exam  Vitals reviewed. Constitutional: He is oriented to person, place, and time. He appears well-developed and well-nourished. No distress.  HENT:  Head: Normocephalic.  Right Ear: External ear normal.  Left Ear: External ear normal.  Nose: Nose normal.  Mouth/Throat: Oropharynx is clear and moist. No oropharyngeal exudate.  Eyes: Conjunctivae are normal. Pupils are equal, round, and reactive to light. Right eye exhibits no discharge. Left eye exhibits no discharge. No scleral icterus.  Neck: Neck supple. No JVD present. No thyromegaly present.  Cardiovascular: Normal rate, regular rhythm and normal heart sounds.  Exam reveals no gallop and no friction rub.   No murmur heard. Pulmonary/Chest: Effort normal and breath sounds normal. No respiratory distress. He has no wheezes. He has no rales. He exhibits no tenderness.  Abdominal: Soft. Bowel sounds are normal. He exhibits no distension. There is no tenderness. There is no rebound and no guarding.  Musculoskeletal: He exhibits no edema.  Lymphadenopathy:    He has no cervical adenopathy.  Neurological: He is alert and oriented to person, place, and time. He has normal reflexes. No cranial nerve deficit. He exhibits normal muscle tone. Coordination normal.  Skin: Skin is warm. No rash noted. He is not diaphoretic. No erythema. No pallor.  Psychiatric: He has a normal mood and affect. His behavior is normal. Judgment and thought  content normal.          Assessment & Plan:   1. Other malaise and fatigue This is multifactorial.  He feels better since his BP has come up slightly.  I will leave him off altace for now.  Recheck in 3 months and if he is doing well, I will resume a low dose of altace.  Continue to encourage him to drink plenty of fluids during the  day.  Recheck in 3 months.

## 2013-01-02 ENCOUNTER — Telehealth: Payer: Self-pay | Admitting: Family Medicine

## 2013-01-02 DIAGNOSIS — L570 Actinic keratosis: Secondary | ICD-10-CM

## 2013-01-02 NOTE — Telephone Encounter (Signed)
Pt needs a referral to Dr. Orson Aloe (dermatology) in [place] again. He has been seeing them, but they need an updated referral. Can we please place the referral?  His information is:  [facility] [address] [place], [place] [address]  [phone] (Office) [phone] (Fax)

## 2013-01-03 NOTE — Telephone Encounter (Signed)
Referral ordered

## 2013-01-15 ENCOUNTER — Telehealth: Payer: Self-pay | Admitting: Family Medicine

## 2013-01-15 MED ORDER — LORAZEPAM 1 MG PO TABS: ORAL_TABLET | ORAL | Status: DC

## 2013-01-15 NOTE — Telephone Encounter (Signed)
Lorazepam 1 mg  One in Am  1/2 in PM  #60  Last RF 12/13/12  OK refill?

## 2013-01-15 NOTE — Telephone Encounter (Signed)
ok 

## 2013-01-15 NOTE — Telephone Encounter (Signed)
rx called in

## 2013-01-21 ENCOUNTER — Other Ambulatory Visit: Payer: Self-pay | Admitting: Cardiology

## 2013-01-21 MED ORDER — ROSUVASTATIN CALCIUM 5 MG PO TABS: 5.0000 mg | ORAL_TABLET | Freq: Every day | ORAL | Status: DC

## 2013-01-21 MED ORDER — CLOPIDOGREL BISULFATE 75 MG PO TABS: 75.0000 mg | ORAL_TABLET | Freq: Every day | ORAL | Status: DC

## 2013-02-13 ENCOUNTER — Other Ambulatory Visit: Payer: Self-pay | Admitting: Family Medicine

## 2013-02-13 DIAGNOSIS — N400 Enlarged prostate without lower urinary tract symptoms: Secondary | ICD-10-CM

## 2013-02-13 MED ORDER — TAMSULOSIN HCL 0.4 MG PO CAPS: 0.4000 mg | ORAL_CAPSULE | Freq: Every day | ORAL | Status: DC

## 2013-02-13 NOTE — Telephone Encounter (Signed)
Rx Refilled  

## 2013-03-12 ENCOUNTER — Telehealth: Payer: Self-pay | Admitting: *Deleted

## 2013-03-12 NOTE — Telephone Encounter (Signed)
Patient needs refill on folic acid and atenolol to be sent to [facility]. Thanks, MI

## 2013-03-12 NOTE — Telephone Encounter (Signed)
[facility] shows pt is not on atenolol. Is it okay to fill atenolol and folic acid as pt is taking it?

## 2013-03-12 NOTE — Telephone Encounter (Signed)
OK to refill atenolol and folic acid

## 2013-03-14 MED ORDER — FOLIC ACID 1 MG PO TABS: 1.0000 mg | ORAL_TABLET | Freq: Every day | ORAL | Status: DC

## 2013-03-14 MED ORDER — ATENOLOL 50 MG PO TABS: 50.0000 mg | ORAL_TABLET | Freq: Every day | ORAL | Status: DC

## 2013-03-14 NOTE — Addendum Note (Signed)
Addended byOrlene Plum H on: 03/14/2013 08:12 AM   Modules accepted: Orders

## 2013-03-14 NOTE — Telephone Encounter (Signed)
Refilled

## 2013-03-18 ENCOUNTER — Other Ambulatory Visit: Payer: Self-pay | Admitting: Family Medicine

## 2013-04-12 ENCOUNTER — Other Ambulatory Visit: Payer: Self-pay | Admitting: Family Medicine

## 2013-04-12 ENCOUNTER — Telehealth: Payer: Self-pay | Admitting: Family Medicine

## 2013-04-12 MED ORDER — TEMAZEPAM 15 MG PO CAPS: 15.0000 mg | ORAL_CAPSULE | Freq: Every evening | ORAL | Status: DC | PRN

## 2013-04-12 NOTE — Telephone Encounter (Signed)
req RF Temazepam   OK refill?

## 2013-04-12 NOTE — Telephone Encounter (Signed)
ok 

## 2013-04-12 NOTE — Telephone Encounter (Signed)
Rx called in 

## 2013-04-12 NOTE — Telephone Encounter (Signed)
Last RF 10/14 #60  OK refill?

## 2013-05-21 ENCOUNTER — Other Ambulatory Visit: Payer: Self-pay | Admitting: Family Medicine

## 2013-05-22 ENCOUNTER — Other Ambulatory Visit: Payer: Self-pay | Admitting: Family Medicine

## 2013-05-22 MED ORDER — EZETIMIBE 10 MG PO TABS: ORAL_TABLET | ORAL | Status: DC

## 2013-05-22 NOTE — Telephone Encounter (Signed)
?   OK to Refill  

## 2013-05-23 ENCOUNTER — Other Ambulatory Visit: Payer: Medicare HMO

## 2013-05-23 NOTE — Telephone Encounter (Signed)
ok 

## 2013-07-11 ENCOUNTER — Encounter: Payer: Self-pay | Admitting: *Deleted

## 2013-07-30 ENCOUNTER — Telehealth: Payer: Self-pay | Admitting: *Deleted

## 2013-07-30 NOTE — Telephone Encounter (Signed)
Patient has [employer] and needs referral to go to dr Ernesto Rutherford his ear doctor if possible   Faxed [employer] referral to [facility] [employer], awaiting approval

## 2013-07-31 ENCOUNTER — Other Ambulatory Visit: Payer: Self-pay | Admitting: Family Medicine

## 2013-07-31 NOTE — Telephone Encounter (Signed)
Medication refilled per protocol. 

## 2013-08-20 NOTE — Telephone Encounter (Signed)
Faxed [facility] for status on referral, pending authorization.

## 2013-08-21 ENCOUNTER — Other Ambulatory Visit: Payer: Self-pay | Admitting: Interventional Cardiology

## 2013-08-21 NOTE — Telephone Encounter (Signed)
Received fax back from [facility] with authorization number [id number] to Dr. Minna Merritts [facility]. Form faxed to that facility.

## 2013-08-30 ENCOUNTER — Ambulatory Visit (INDEPENDENT_AMBULATORY_CARE_PROVIDER_SITE_OTHER): Payer: Medicare PPO | Admitting: Family Medicine

## 2013-08-30 ENCOUNTER — Encounter: Payer: Self-pay | Admitting: Family Medicine

## 2013-08-30 VITALS — BP 94/58 | HR 64 | Temp 97.6°F | Resp 18 | Ht 68.0 in | Wt 180.0 lb

## 2013-08-30 DIAGNOSIS — R55 Syncope and collapse: Secondary | ICD-10-CM

## 2013-08-30 DIAGNOSIS — R5381 Other malaise: Secondary | ICD-10-CM

## 2013-08-30 DIAGNOSIS — R5383 Other fatigue: Secondary | ICD-10-CM

## 2013-08-30 DIAGNOSIS — R531 Weakness: Secondary | ICD-10-CM

## 2013-08-30 NOTE — Progress Notes (Signed)
Subjective:    Patient ID: Evan [name], male    DOB: 06/17/1927, 80 y.o.   MRN: [id number]  HPI Patient's wife suffers from severe dementia. Patient is under tremendous stress in caring for her. He is not sleeping well. He is extremely anxious. He takes temazepam at night to help him sleep. He takes lorazepam in the mornings to help with anxiety. He is not sure when he takes his medicines but he believes he is taking atenolol and ramipril  first thing in the AM.    Every day around lunch time after he wakes up from his mid morning nap patient feels extremely jittery and shaky and nervous and extremely weak. He states he feels like he could black out. He denies any chest pain or palpitations or shortness of breath. He breakfast every morning and he has no history of hypoglycemia. Past Medical History  Diagnosis Date  . CAD (coronary artery disease)   . BPH (benign prostatic hyperplasia)   . Arthritis   . Asthma   . GERD (gastroesophageal reflux disease)   . Hypertension   . Anxiety   . History of ASCVD     MULTIVESSEL  . 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 Hyperlipidemia   . DJD (degenerative joint disease)   . Hemochromatosis     Possible, elevated iron stores, hook-like osteophytes on hand films, normal LFTs   Past Surgical History  Procedure Laterality Date  . Cholecystectomy    . Rotator cuff repair    . Knee surgery    . Coronary artery bypass graft    . Descending aortic aneurysm repair w/ stent      DE stent ostium into the right radial free graft at OM-, 02-2007   Current Outpatient Prescriptions on File Prior to Visit  Medication Sig Dispense Refill  . alendronate (FOSAMAX) 70 MG tablet Take 70 mg by mouth once a week. Take with a full glass of water on an empty stomach.      Marland Kitchen atenolol (TENORMIN) 50 MG tablet Take 1 tablet (50 mg total) by mouth daily.  180 tablet  1  . clopidogrel (PLAVIX) 75 MG tablet Take 1 tablet (75 mg total) by  mouth daily.  90 tablet  2  . ezetimibe (ZETIA) 10 MG tablet TAKE 1 TABLET AT BEDTIME  30 tablet  1  . fluticasone-salmeterol (ADVAIR HFA) 115-21 MCG/ACT inhaler Inhale 2 puffs into the lungs 2 (two) times daily.        . folic acid (FOLVITE) 1 MG tablet Take 1 tablet (1 mg total) by mouth daily.  90 tablet  1  . LORazepam (ATIVAN) 1 MG tablet TAKE ONE TABLET BY MOUTH EVERY MORNING AND ONE-HALF TABLET EVERY AFTERNOON  60 tablet  2  . methotrexate (RHEUMATREX) 2.5 MG tablet Take 20 mg by mouth 3 (three) times a week.       Marland Kitchen NITROSTAT 0.4 MG SL tablet PLACE ONE (1) TABLET UNDER THE TONGUE AS NEEDED FOR CHEST PAIN  25 tablet  3  . pantoprazole (PROTONIX) 40 MG tablet Take 40 mg by mouth daily.      . ramipril (ALTACE) 5 MG tablet Take 5 mg by mouth daily.        . rosuvastatin (CRESTOR) 5 MG tablet Take 1 tablet (5 mg total) by mouth daily.  90 tablet  2  . tamsulosin (FLOMAX) 0.4 MG CAPS capsule TAKE 1 CAPSULE EVERY DAY  90 capsule  3  . venlafaxine XR (EFFEXOR-XR) 75 MG 24 hr capsule TAKE ONE CAPSULE BY MOUTH DAILY  30 capsule  5   No current facility-administered medications on file prior to visit.   Allergies  Allergen Reactions  . Doxycycline Other (See Comments)    REACTION:  unknown  . Feldene [Piroxicam] Other (See Comments)    REACTION: Blisters  . Statins     Myalgias  . Valium Other (See Comments)    REACTION:  unknown  . Tequin Anxiety   History   Social History  . Marital Status: Married    Spouse Name: N/A    Number of Children: N/A  . Years of Education: N/A   Occupational History  . Not on file.   Social History Main Topics  . Smoking status: Never Smoker   . Smokeless tobacco: Former Systems developer  . Alcohol Use: No  . Drug Use: No  . Sexual Activity: Not on file   Other Topics Concern  . Not on file   Social History Narrative  . No narrative on file      Review of Systems  All other systems reviewed and are negative.      Objective:   Physical Exam    Vitals reviewed. Constitutional: He is oriented to person, place, and time. He appears well-developed and well-nourished. No distress.  Neck: Neck supple. No thyromegaly present.  Cardiovascular: Normal rate, regular rhythm and normal heart sounds.  Exam reveals no gallop and no friction rub.   No murmur heard. Pulmonary/Chest: Effort normal and breath sounds normal. No respiratory distress. He has no wheezes. He has no rales. He exhibits no tenderness.  Abdominal: Soft. Bowel sounds are normal. He exhibits no distension and no mass. There is no tenderness. There is no rebound and no guarding.  Neurological: He is alert and oriented to person, place, and time. He has normal reflexes. No cranial nerve deficit. He exhibits normal muscle tone. Coordination normal.  Skin: He is not diaphoretic.  Psychiatric: He has a normal mood and affect. His behavior is normal. Judgment and thought content normal.          Assessment & Plan:  Weakness generalized  Near syncope - Plan: Holter monitor - 24 hour  EKG reveals normal sinus rhythm at 70 beats per minute with normal intervals left axis deviation. There is no evidence of ischemia or infarction.  I believe one of 2 scenarios is at play due to the fact the symptoms occur everyday at the same time. The patient is taking his blood pressure medicine in the morning, by lunchtime his blood pressure could be dropping causing him to feel extremely weak. The other possibility is lorazepam is potentially wearing off around lunch time after his morning dose and the patient is beginning to feel anxious.  Third possibility is some type of cardiac arrhythmia. I will address hypotension as a cause first.  Temporarily discontinue Altace and decrease atenolol to 25 milligrams a day. Recheck here next week. I will also obtain Holter monitor to rule out cardiac arrhythmias.  Monitor is normal and the symptoms did not improve with decrease in his blood pressure medication,  anxiety by adding a noon dose of lorazepam

## 2013-09-03 ENCOUNTER — Encounter: Payer: Self-pay | Admitting: Family Medicine

## 2013-09-03 ENCOUNTER — Ambulatory Visit (INDEPENDENT_AMBULATORY_CARE_PROVIDER_SITE_OTHER): Payer: Medicare PPO | Admitting: Family Medicine

## 2013-09-03 VITALS — BP 110/68 | HR 64 | Temp 97.8°F | Resp 18 | Ht 68.0 in | Wt 178.0 lb

## 2013-09-03 DIAGNOSIS — F411 Generalized anxiety disorder: Secondary | ICD-10-CM

## 2013-09-03 MED ORDER — LORAZEPAM 1 MG PO TABS: ORAL_TABLET | ORAL | Status: DC

## 2013-09-03 MED ORDER — VENLAFAXINE HCL ER 150 MG PO CP24: ORAL_CAPSULE | ORAL | Status: DC

## 2013-09-03 NOTE — Progress Notes (Signed)
Subjective:    Patient ID: Evan [name], male    DOB: [birth year], 80 y.o.   MRN: [id number]  HPI 08/30/13 Patient's wife suffers from severe dementia. Patient is under tremendous stress in caring for her. He is not sleeping well. He is extremely anxious. He takes temazepam at night to help him sleep. He takes lorazepam in the mornings to help with anxiety. He is not sure when he takes his medicines but he believes he is taking atenolol and ramipril  first thing in the AM.    Every day around lunch time after he wakes up from his mid morning nap patient feels extremely jittery and shaky and nervous and extremely weak. He states he feels like he could black out. He denies any chest pain or palpitations or shortness of breath. He breakfast every morning and he has no history of hypoglycemia.  At that time, my plan was: EKG reveals normal sinus rhythm at 70 beats per minute with normal intervals left axis deviation. There is no evidence of ischemia or infarction.  I believe one of 2 scenarios is at play due to the fact the symptoms occur everyday at the same time. The patient is taking his blood pressure medicine in the morning, by lunchtime his blood pressure could be dropping causing him to feel extremely weak. The other possibility is lorazepam is potentially wearing off around lunch time after his morning dose and the patient is beginning to feel anxious.  Third possibility is some type of cardiac arrhythmia. I will address hypotension as a cause first.  Temporarily discontinue Altace and decrease atenolol to 25 milligrams a day. Recheck here next week. I will also obtain Holter monitor to rule out cardiac arrhythmias.  If the monitor is normal and the symptoms do not improve with decrease in his blood pressure medication, I will treat anxiety by adding a noon dose of lorazepam.  09/03/13 Patient is here today for follow up.  Even though blood pressures better, the patient's symptoms have not improved. He  still wakes up from his afternoon nap around 3:00 in the afternoon and feels extremely weak and jittery and anxious. Patient takes his lorazepam in the morning. He takes his next dose in the afternoon around 3:00. The patient's symptoms would coincide with the am lorazepam losing its effectiveness.  He is under tremendous stress came from his wife. He is also been on benzodiazepines for a long time and is probably habituated to these meds. Past Medical History  Diagnosis Date  . CAD (coronary artery disease)   . BPH (benign prostatic hyperplasia)   . Arthritis   . Asthma   . GERD (gastroesophageal reflux disease)   . Hypertension   . Anxiety   . History of ASCVD     MULTIVESSEL  . 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 Hyperlipidemia   . DJD (degenerative joint disease)   . Hemochromatosis     Possible, elevated iron stores, hook-like osteophytes on hand films, normal LFTs   Past Surgical History  Procedure Laterality Date  . Cholecystectomy    . Rotator cuff repair    . Knee surgery    . Coronary artery bypass graft    . Descending aortic aneurysm repair w/ stent      DE stent ostium into the right radial free graft at OM-, 02-2007   Current Outpatient Prescriptions on File Prior to Visit  Medication Sig Dispense Refill  .  alendronate (FOSAMAX) 70 MG tablet Take 70 mg by mouth once a week. Take with a full glass of water on an empty stomach.      Marland Kitchen atenolol (TENORMIN) 50 MG tablet Take 1 tablet (50 mg total) by mouth daily.  180 tablet  1  . clopidogrel (PLAVIX) 75 MG tablet Take 1 tablet (75 mg total) by mouth daily.  90 tablet  2  . ezetimibe (ZETIA) 10 MG tablet TAKE 1 TABLET AT BEDTIME  30 tablet  1  . fluticasone-salmeterol (ADVAIR HFA) 115-21 MCG/ACT inhaler Inhale 2 puffs into the lungs 2 (two) times daily.        . folic acid (FOLVITE) 1 MG tablet Take 1 tablet (1 mg total) by mouth daily.  90 tablet  1  . LORazepam (ATIVAN) 1 MG tablet  TAKE ONE TABLET BY MOUTH EVERY MORNING AND ONE-HALF TABLET EVERY AFTERNOON  60 tablet  2  . methotrexate (RHEUMATREX) 2.5 MG tablet Take 20 mg by mouth 3 (three) times a week.       Marland Kitchen NITROSTAT 0.4 MG SL tablet PLACE ONE (1) TABLET UNDER THE TONGUE AS NEEDED FOR CHEST PAIN  25 tablet  3  . pantoprazole (PROTONIX) 40 MG tablet Take 40 mg by mouth daily.      . rosuvastatin (CRESTOR) 5 MG tablet Take 1 tablet (5 mg total) by mouth daily.  90 tablet  2  . tamsulosin (FLOMAX) 0.4 MG CAPS capsule TAKE 1 CAPSULE EVERY DAY  90 capsule  3  . temazepam (RESTORIL) 30 MG capsule Take 30 mg by mouth at bedtime as needed for sleep.      Marland Kitchen venlafaxine XR (EFFEXOR-XR) 75 MG 24 hr capsule TAKE ONE CAPSULE BY MOUTH DAILY  30 capsule  5   No current facility-administered medications on file prior to visit.   Allergies  Allergen Reactions  . Doxycycline Other (See Comments)    REACTION:  unknown  . Feldene [Piroxicam] Other (See Comments)    REACTION: Blisters  . Statins     Myalgias  . Valium Other (See Comments)    REACTION:  unknown  . Tequin Anxiety   History   Social History  . Marital Status: Married    Spouse Name: N/A    Number of Children: N/A  . Years of Education: N/A   Occupational History  . Not on file.   Social History Main Topics  . Smoking status: Never Smoker   . Smokeless tobacco: Former Systems developer  . Alcohol Use: No  . Drug Use: No  . Sexual Activity: Not on file   Other Topics Concern  . Not on file   Social History Narrative  . No narrative on file      Review of Systems  All other systems reviewed and are negative.      Objective:   Physical Exam  Vitals reviewed. Constitutional: He is oriented to person, place, and time. He appears well-developed and well-nourished. No distress.  Neck: Neck supple. No thyromegaly present.  Cardiovascular: Normal rate, regular rhythm and normal heart sounds.  Exam reveals no gallop and no friction rub.   No murmur  heard. Pulmonary/Chest: Effort normal and breath sounds normal. No respiratory distress. He has no wheezes. He has no rales. He exhibits no tenderness.  Abdominal: Soft. Bowel sounds are normal. He exhibits no distension and no mass. There is no tenderness. There is no rebound and no guarding.  Neurological: He is alert and oriented to person, place, and  time. He has normal reflexes. No cranial nerve deficit. He exhibits normal muscle tone. Coordination normal.  Skin: He is not diaphoretic.  Psychiatric: He has a normal mood and affect. His behavior is normal. Judgment and thought content normal.          Assessment & Plan:  1. Generalized anxiety disorder Increase Effexor to 150 mg by mouth every morning. Change Ativan to 1 mg at 8 AM, 1 mg at noon, 1 mg at TM. Patient can still take his temazepam to sleep at night. Presently this will help abate the patient's symptoms. I will schedule the patient for Holter monitor to rule out cardiac arrhythmias. - venlafaxine XR (EFFEXOR-XR) 150 MG 24 hr capsule; TAKE ONE CAPSULE BY MOUTH DAILY  Dispense: 30 capsule; Refill: 5 - LORazepam (ATIVAN) 1 MG tablet; TAKE ONE TABLET BY MOUTH three times a day (8 am, 12, 4 pm)  Dispense: 90 tablet; Refill: 2

## 2013-09-04 ENCOUNTER — Ambulatory Visit (INDEPENDENT_AMBULATORY_CARE_PROVIDER_SITE_OTHER): Payer: Medicare PPO | Admitting: *Deleted

## 2013-09-04 DIAGNOSIS — R002 Palpitations: Secondary | ICD-10-CM

## 2013-09-09 ENCOUNTER — Encounter: Payer: Self-pay | Admitting: Interventional Cardiology

## 2013-09-09 ENCOUNTER — Ambulatory Visit (INDEPENDENT_AMBULATORY_CARE_PROVIDER_SITE_OTHER): Payer: Medicare HMO | Admitting: Interventional Cardiology

## 2013-09-09 ENCOUNTER — Other Ambulatory Visit: Payer: Commercial Managed Care - HMO

## 2013-09-09 VITALS — BP 115/58 | HR 57 | Ht 65.0 in | Wt 178.8 lb

## 2013-09-09 DIAGNOSIS — Z951 Presence of aortocoronary bypass graft: Secondary | ICD-10-CM

## 2013-09-09 DIAGNOSIS — I10 Essential (primary) hypertension: Secondary | ICD-10-CM

## 2013-09-09 DIAGNOSIS — E785 Hyperlipidemia, unspecified: Secondary | ICD-10-CM

## 2013-09-09 DIAGNOSIS — I2510 Atherosclerotic heart disease of native coronary artery without angina pectoris: Secondary | ICD-10-CM | POA: Insufficient documentation

## 2013-09-09 LAB — HEPATIC FUNCTION PANEL
ALT: 12 U/L (ref 0–53)
AST: 18 U/L (ref 0–37)
Albumin: 4 g/dL (ref 3.5–5.2)
Alkaline Phosphatase: 70 U/L (ref 39–117)
Bilirubin, Direct: 0.2 mg/dL (ref 0.0–0.3)
Total Bilirubin: 0.8 mg/dL (ref 0.2–1.2)
Total Protein: 6.3 g/dL (ref 6.0–8.3)

## 2013-09-09 NOTE — Progress Notes (Signed)
Patient ID: Evan [name], male   DOB: May 17, 1927, 80 y.o.   MRN: [id number]    [place], [address] [place], [place]  [address] Phone: [phone] Fax:  [phone]  Date:  09/09/2013   ID:  Evan [name], DOB 1927-04-21, MRN [id number]  PCP:  Odette Fraction, MD      History of Present Illness: Evan [name] is a 80 y.o. male with CAD. CABG in 1998, stent in 2008. He had a normal stress test in 2012. He does not do much walking because of arthritis. He has to go up and down stairs in his house multiple times a day. No problems with this.   Has chest discomfort/tightness last night around L sternal border , lasting until sleep. Pain was gone when he woke up this morning.  He has arthritis in his left shoulder and has pain from this.  Rates discomfort about 2/10. Had about 10 or 15 episodes of chest discomfort within the past year, none of which were related to ecxertion.  He has not tried NTG. Thinks the pain is related to his OA. Pain is nothing like what he had prior to CABG. Denies dizziness, syncope, SHOB, orthopnea, PND LE edema. No bleeding. States that BP has been low, but can't recall the actual readings. Feels weak and doesn't want to do anything. No falls. Climbs (10-11 steps) steps about 8 to 10 times daily and walks to the mailbox daily w/o [facility] or chest pain.   c/o Dizziness while getting up from sitting position.  Better since BP meds adjusted.  Denies : Leg edema.  Palpitations.  Paroxysmal nocturnal dyspnea.  Syncope.     Wt Readings from Last 3 Encounters:  09/09/13 178 lb 12.8 oz (81.103 kg)  09/03/13 178 lb (80.74 kg)  08/30/13 180 lb (81.647 kg)     Past Medical History  Diagnosis Date  . CAD (coronary artery disease)   . BPH (benign prostatic hyperplasia)   . Arthritis   . Asthma   . GERD (gastroesophageal reflux disease)   . Hypertension   . Anxiety   . History of ASCVD     MULTIVESSEL  . 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 Hyperlipidemia   . DJD (degenerative joint disease)   . Hemochromatosis     Possible, elevated iron stores, hook-like osteophytes on hand films, normal LFTs    Current Outpatient Prescriptions  Medication Sig Dispense Refill  . alendronate (FOSAMAX) 70 MG tablet Take 70 mg by mouth once a week. Take with a full glass of water on an empty stomach.      Marland Kitchen atenolol (TENORMIN) 50 MG tablet Take 25 mg by mouth daily.      . clopidogrel (PLAVIX) 75 MG tablet Take 1 tablet (75 mg total) by mouth daily.  90 tablet  2  . ezetimibe (ZETIA) 10 MG tablet TAKE 1 TABLET AT BEDTIME  30 tablet  1  . folic acid (FOLVITE) 1 MG tablet Take 1 tablet (1 mg total) by mouth daily.  90 tablet  1  . LORazepam (ATIVAN) 1 MG tablet TAKE ONE TABLET BY MOUTH three times a day (8 am, 12, 4 pm)  90 tablet  2  . methotrexate (RHEUMATREX) 2.5 MG tablet Take 20 mg by mouth 3 (three) times a week.       Marland Kitchen NITROSTAT 0.4 MG SL tablet PLACE ONE (1) TABLET UNDER THE TONGUE AS  NEEDED FOR CHEST PAIN  25 tablet  3  . pantoprazole (PROTONIX) 40 MG tablet Take 40 mg by mouth daily.      . rosuvastatin (CRESTOR) 5 MG tablet Take 1 tablet (5 mg total) by mouth daily.  90 tablet  2  . tamsulosin (FLOMAX) 0.4 MG CAPS capsule TAKE 1 CAPSULE EVERY DAY  90 capsule  3  . temazepam (RESTORIL) 30 MG capsule Take 30 mg by mouth at bedtime as needed for sleep.      Marland Kitchen venlafaxine XR (EFFEXOR-XR) 150 MG 24 hr capsule TAKE ONE CAPSULE BY MOUTH DAILY  30 capsule  5   No current facility-administered medications for this visit.    Allergies:    Allergies  Allergen Reactions  . Doxycycline Other (See Comments)    REACTION:  unknown  . Feldene [Piroxicam] Other (See Comments)    REACTION: Blisters  . Statins     Myalgias  . Valium Other (See Comments)    REACTION:  unknown  . Tequin Anxiety    Social History:  The patient  reports that he has never smoked. He has quit using smokeless tobacco. He reports that he does not drink  alcohol or use illicit drugs.   Family History:  The patient's family history includes Arthritis-Osteo in his sister.   ROS:  Please see the history of present illness.  No nausea, vomiting.  No fevers, chills.  No focal weakness.  No dysuria. Atypical chest pain as described above. Joint pain.   All other systems reviewed and negative.   PHYSICAL EXAM: VS:  BP 115/58  Pulse 57  Ht 5\' 5"  (1.651 m)  Wt 178 lb 12.8 oz (81.103 kg)  BMI 29.75 kg/m2 Well nourished, well developed, in no acute distress HEENT: normal Neck: no JVD, no carotid bruits Cardiac:  normal S1, S2; RRR;  Lungs:  clear to auscultation bilaterally, no wheezing, rhonchi or rales Abd: soft, nontender, no hepatomegaly Ext: no edema Skin: warm and dry Neuro:   no focal abnormalities noted  EKG:  Sinus bradycardia, NSST     ASSESSMENT AND PLAN:  1. Coronary atherosclerosis of native coronary artery  Continue Nitroglycerin 0.4 mg tablet, 0.4 mg, 1 tablet as directed, SL, as directed prn chest pain Notes: He had a negative stress test in 2012. He has been under a lot of stress due to his wife's condition. If his symptoms get worse, we'll plan on repeat stress test. He will let us know.  2. Hypercholesteremia, pure  Continue Crestor Tablet, 5 MG, 1 tab, Orally, Once a day .  LDL 39 in 8/14.  HDL 41.  Check lipids in 8/15.   3. Essential hypertension, benign  Continue Ramipril Capsule, 5 MG, 1 capsule, Orally, Once a day 4. Insomnia: he has trouble sleeping.  He will check with his PMD regarding a medication.  Notes: Controlled.    Signed, Mina Marble, MD, [facility] 09/09/2013 10:28 AM

## 2013-09-09 NOTE — Patient Instructions (Signed)
Your physician recommends that you return for a FASTING lipid profile:   Your physician recommends that you continue on your current medications as directed. Please refer to the Current Medication list given to you today.  Your physician wants you to follow-up in: 1 year with Dr. Irish Lack.  You will receive a reminder letter in the mail two months in advance. If you don't receive a letter, please call our office to schedule the follow-up appointment.

## 2013-09-10 LAB — NMR LIPOPROFILE WITH LIPIDS
Cholesterol, Total: 97 mg/dL (ref <200)
HDL Particle Number: 29.9 umol/L — ABNORMAL LOW (ref >=30.5)
HDL Size: 9.5 nm (ref >=9.2)
HDL-C: 37 mg/dL — ABNORMAL LOW (ref >=40)
LDL (calc): 42 mg/dL (ref <100)
LDL Particle Number: 523 nmol/L (ref <1000)
LDL Size: 20 nm — ABNORMAL LOW (ref >20.5)
LP-IR Score: 45 (ref <=45)
Large HDL-P: 6.1 umol/L (ref >=4.8)
Large VLDL-P: 2.3 nmol/L (ref <=2.7)
Small LDL Particle Number: 395 nmol/L (ref <=527)
Triglycerides: 92 mg/dL (ref <150)
VLDL Size: 47.5 nm — ABNORMAL HIGH (ref <=46.6)

## 2013-09-27 ENCOUNTER — Telehealth: Payer: Self-pay | Admitting: Cardiology

## 2013-09-27 DIAGNOSIS — E785 Hyperlipidemia, unspecified: Secondary | ICD-10-CM

## 2013-09-27 NOTE — Telephone Encounter (Signed)
Message copied by Alcario Drought on Fri Sep 27, 2013  3:09 PM ------      Message from: SMART, Maralyn Sago      Created: Mon Sep 16, 2013  5:24 PM       Excellent panel on crestor 5 mg qd and Zetia 10 mg qd.  LDL-P number well below 1000 nmol/L in 80 y.o. Patient.  Will d/c zetia at this time, and continue Crestor daily.      Plan:      1.  D/C Zetia.      2.  Continue Crestor 5 mg qd.      3.  Recheck NMR LipoProfile and hepatic panel in 6 months.      Please notify patient, update meds, and set up labs. Thanks. ------

## 2013-09-27 NOTE — Telephone Encounter (Signed)
Pt notified. Meds updated and labs ordered.

## 2013-10-01 ENCOUNTER — Telehealth: Payer: Self-pay | Admitting: *Deleted

## 2013-10-01 NOTE — Telephone Encounter (Signed)
Pt called wanting a referral to Dr. Julious Payer [facility] for black spot under right eye, pt has not seen dermatologist in a year so he is coming in for office visit to have mole looked at.  Dr. Julious Payer NPI number is [id number] for [facility] referral.

## 2013-10-01 NOTE — Telephone Encounter (Signed)
Message copied by Maureen Chatters on Tue Oct 01, 2013  3:58 PM ------      Message from: Devoria Glassing      Created: Mon Sep 30, 2013  3:34 PM       Patient is needing a referral because of his [facility] to dr hennison? In  [phone]                                    [phone] he would a call back after we call this dermatologist  ------

## 2013-10-02 ENCOUNTER — Encounter: Payer: Self-pay | Admitting: Family Medicine

## 2013-10-11 ENCOUNTER — Encounter: Payer: Self-pay | Admitting: Family Medicine

## 2013-10-11 ENCOUNTER — Ambulatory Visit (INDEPENDENT_AMBULATORY_CARE_PROVIDER_SITE_OTHER): Payer: Medicare PPO | Admitting: Family Medicine

## 2013-10-11 ENCOUNTER — Ambulatory Visit: Payer: Medicare PPO | Admitting: Family Medicine

## 2013-10-11 VITALS — BP 118/62 | HR 76 | Temp 97.6°F | Resp 16 | Ht 64.0 in | Wt 178.0 lb

## 2013-10-11 DIAGNOSIS — R5381 Other malaise: Secondary | ICD-10-CM

## 2013-10-11 DIAGNOSIS — R531 Weakness: Secondary | ICD-10-CM

## 2013-10-11 DIAGNOSIS — R5383 Other fatigue: Secondary | ICD-10-CM

## 2013-10-11 LAB — URINALYSIS, ROUTINE W REFLEX MICROSCOPIC
Bilirubin Urine: NEGATIVE
Glucose, UA: NEGATIVE mg/dL
Hgb urine dipstick: NEGATIVE
Ketones, ur: NEGATIVE mg/dL
Leukocytes, UA: NEGATIVE
Nitrite: NEGATIVE
Protein, ur: NEGATIVE mg/dL
Specific Gravity, Urine: 1.02 (ref 1.005–1.030)
Urobilinogen, UA: 0.2 mg/dL (ref 0.0–1.0)
pH: 7 (ref 5.0–8.0)

## 2013-10-11 NOTE — Progress Notes (Signed)
Subjective:    Patient ID: Evan [name], male    DOB: 1927/08/02, 80 y.o.   MRN: [id number]  HPI 08/30/13 Patient's wife suffers from severe dementia. Patient is under tremendous stress in caring for her. He is not sleeping well. He is extremely anxious. He takes temazepam at night to help him sleep. He takes lorazepam in the mornings to help with anxiety. He is not sure when he takes his medicines but he believes he is taking atenolol and ramipril  first thing in the AM.    Every day around lunch time after he wakes up from his mid morning nap patient feels extremely jittery and shaky and nervous and extremely weak. He states he feels like he could black out. He denies any chest pain or palpitations or shortness of breath. He breakfast every morning and he has no history of hypoglycemia.  At that time, my plan was: EKG reveals normal sinus rhythm at 70 beats per minute with normal intervals left axis deviation. There is no evidence of ischemia or infarction.  I believe one of 2 scenarios is at play due to the fact the symptoms occur everyday at the same time. The patient is taking his blood pressure medicine in the morning, by lunchtime his blood pressure could be dropping causing him to feel extremely weak. The other possibility is lorazepam is potentially wearing off around lunch time after his morning dose and the patient is beginning to feel anxious.  Third possibility is some type of cardiac arrhythmia. I will address hypotension as a cause first.  Temporarily discontinue Altace and decrease atenolol to 25 milligrams a day. Recheck here next week. I will also obtain Holter monitor to rule out cardiac arrhythmias.  If the monitor is normal and the symptoms do not improve with decrease in his blood pressure medication, I will treat anxiety by adding a noon dose of lorazepam.  09/03/13 Patient is here today for follow up.  Even though blood pressures better, the patient's symptoms have not improved. He  still wakes up from his afternoon nap around 3:00 in the afternoon and feels extremely weak and jittery and anxious. Patient takes his lorazepam in the morning. He takes his next dose in the afternoon around 3:00. The patient's symptoms would coincide with the am lorazepam losing its effectiveness.  He is under tremendous stress came from his wife. He is also been on benzodiazepines for a long time and is probably habituated to these meds.  At that time, my plan was: 1. Generalized anxiety disorder Increase Effexor to 150 mg by mouth every morning. Change Ativan to 1 mg at 8 AM, 1 mg at noon, 1 mg at TM. Patient can still take his temazepam to sleep at night. Presently this will help abate the patient's symptoms. I will schedule the patient for Holter monitor to rule out cardiac arrhythmias. - venlafaxine XR (EFFEXOR-XR) 150 MG 24 hr capsule; TAKE ONE CAPSULE BY MOUTH DAILY  Dispense: 30 capsule; Refill: 5 - LORazepam (ATIVAN) 1 MG tablet; TAKE ONE TABLET BY MOUTH three times a day (8 am, 12, 4 pm)  Dispense: 90 tablet; Refill: 2  10/11/13 Patient has seen his cardiologist. His Holter monitor was completely normal. He continues to complain of feeling nervous and weak and shaky first thing in the morning. He has similar symptoms after his midday nap. This seems to coincide with when his Ativan loses effectiveness. He continues to be under a lot of stress at home taking care  of his wife who has severe dementia. The symptoms have not changed. Past Medical History  Diagnosis Date  . CAD (coronary artery disease)   . BPH (benign prostatic hyperplasia)   . Arthritis   . Asthma   . GERD (gastroesophageal reflux disease)   . Hypertension   . Anxiety   . History of ASCVD     MULTIVESSEL  . 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 Hyperlipidemia   . DJD (degenerative joint disease)   . Hemochromatosis     Possible, elevated iron stores, hook-like osteophytes on hand films,  normal LFTs   Past Surgical History  Procedure Laterality Date  . Cholecystectomy    . Rotator cuff repair    . Knee surgery    . Coronary artery bypass graft    . Descending aortic aneurysm repair w/ stent      DE stent ostium into the right radial free graft at OM-, 02-2007   Current Outpatient Prescriptions on File Prior to Visit  Medication Sig Dispense Refill  . alendronate (FOSAMAX) 70 MG tablet Take 70 mg by mouth once a week. Take with a full glass of water on an empty stomach.      Marland Kitchen atenolol (TENORMIN) 50 MG tablet Take 25 mg by mouth daily.      . clopidogrel (PLAVIX) 75 MG tablet Take 1 tablet (75 mg total) by mouth daily.  90 tablet  2  . folic acid (FOLVITE) 1 MG tablet Take 1 tablet (1 mg total) by mouth daily.  90 tablet  1  . LORazepam (ATIVAN) 1 MG tablet TAKE ONE TABLET BY MOUTH three times a day (8 am, 12, 4 pm)  90 tablet  2  . methotrexate (RHEUMATREX) 2.5 MG tablet Take 20 mg by mouth 3 (three) times a week.       Marland Kitchen NITROSTAT 0.4 MG SL tablet PLACE ONE (1) TABLET UNDER THE TONGUE AS NEEDED FOR CHEST PAIN  25 tablet  3  . pantoprazole (PROTONIX) 40 MG tablet Take 40 mg by mouth daily.      . rosuvastatin (CRESTOR) 5 MG tablet Take 1 tablet (5 mg total) by mouth daily.  90 tablet  2  . tamsulosin (FLOMAX) 0.4 MG CAPS capsule TAKE 1 CAPSULE EVERY DAY  90 capsule  3  . temazepam (RESTORIL) 30 MG capsule Take 30 mg by mouth at bedtime as needed for sleep.      Marland Kitchen venlafaxine XR (EFFEXOR-XR) 150 MG 24 hr capsule TAKE ONE CAPSULE BY MOUTH DAILY  30 capsule  5   No current facility-administered medications on file prior to visit.   Allergies  Allergen Reactions  . Doxycycline Other (See Comments)    REACTION:  unknown  . Feldene [Piroxicam] Other (See Comments)    REACTION: Blisters  . Statins     Myalgias  . Valium Other (See Comments)    REACTION:  unknown  . Tequin Anxiety   History   Social History  . Marital Status: Married    Spouse Name: N/A    Number  of Children: N/A  . Years of Education: N/A   Occupational History  . Not on file.   Social History Main Topics  . Smoking status: Never Smoker   . Smokeless tobacco: Former Systems developer  . Alcohol Use: No  . Drug Use: No  . Sexual Activity: Not on file   Other Topics Concern  . Not on file  Social History Narrative  . No narrative on file      Review of Systems  All other systems reviewed and are negative.      Objective:   Physical Exam  Vitals reviewed. Constitutional: He is oriented to person, place, and time. He appears well-developed and well-nourished. No distress.  Neck: Neck supple. No thyromegaly present.  Cardiovascular: Normal rate, regular rhythm and normal heart sounds.  Exam reveals no gallop and no friction rub.   No murmur heard. Pulmonary/Chest: Effort normal and breath sounds normal. No respiratory distress. He has no wheezes. He has no rales. He exhibits no tenderness.  Abdominal: Soft. Bowel sounds are normal. He exhibits no distension and no mass. There is no tenderness. There is no rebound and no guarding.  Neurological: He is alert and oriented to person, place, and time. He has normal reflexes. No cranial nerve deficit. He exhibits normal muscle tone. Coordination normal.  Skin: He is not diaphoretic.  Psychiatric: He has a normal mood and affect. His behavior is normal. Judgment and thought content normal.          Assessment & Plan:  1. Weakness I believe these are panic attacks. I believe his symptoms are related to stress and anxiety. I explained this to the patient in detail in addition to his son-in-law. I recommended that he pursue skilled nursing facility placement for his right to try to alleviate some of the burden of stress which is triggering his panic attacks. Also check a CBC, CMP, TSH, and urinalysis to rule out other potential causes. However his exam today is normal. I tried to reassure the patient as best I could.  I would not  recommend further anxiety medications due to concern of overmedication and polypharmacy. If the patient like a second opinion, I would recommend a psychiatric consultation. - Urinalysis, Routine w reflex microscopic - CBC with Differential - COMPLETE METABOLIC PANEL WITH GFR - TSH

## 2013-10-12 LAB — CBC WITH DIFFERENTIAL/PLATELET
Basophils Absolute: 0 10*3/uL (ref 0.0–0.1)
Basophils Relative: 0 % (ref 0–1)
Eosinophils Absolute: 0 10*3/uL (ref 0.0–0.7)
Eosinophils Relative: 0 % (ref 0–5)
HCT: 44.2 % (ref 39.0–52.0)
Hemoglobin: 15.1 g/dL (ref 13.0–17.0)
Lymphocytes Relative: 17 % (ref 12–46)
Lymphs Abs: 1.7 10*3/uL (ref 0.7–4.0)
MCH: 32.5 pg (ref 26.0–34.0)
MCHC: 34.2 g/dL (ref 30.0–36.0)
MCV: 95.1 fL (ref 78.0–100.0)
Monocytes Absolute: 1 10*3/uL (ref 0.1–1.0)
Monocytes Relative: 10 % (ref 3–12)
Neutro Abs: 7.2 10*3/uL (ref 1.7–7.7)
Neutrophils Relative %: 73 % (ref 43–77)
Platelets: 159 10*3/uL (ref 150–400)
RBC: 4.65 MIL/uL (ref 4.22–5.81)
RDW: 13.7 % (ref 11.5–15.5)
WBC: 9.9 10*3/uL (ref 4.0–10.5)

## 2013-10-12 LAB — COMPLETE METABOLIC PANEL WITH GFR
ALT: 12 U/L (ref 0–53)
AST: 19 U/L (ref 0–37)
Albumin: 4.4 g/dL (ref 3.5–5.2)
Alkaline Phosphatase: 79 U/L (ref 39–117)
BUN: 18 mg/dL (ref 6–23)
CO2: 21 mEq/L (ref 19–32)
Calcium: 9.3 mg/dL (ref 8.4–10.5)
Chloride: 105 mEq/L (ref 96–112)
Creat: 1 mg/dL (ref 0.50–1.35)
GFR, Est African American: 80 mL/min
GFR, Est Non African American: 69 mL/min
Glucose, Bld: 98 mg/dL (ref 70–99)
Potassium: 4.5 mEq/L (ref 3.5–5.3)
Sodium: 138 mEq/L (ref 135–145)
Total Bilirubin: 1 mg/dL (ref 0.2–1.2)
Total Protein: 6.6 g/dL (ref 6.0–8.3)

## 2013-10-12 LAB — TSH: TSH: 1.762 u[IU]/mL (ref 0.350–4.500)

## 2013-10-14 ENCOUNTER — Encounter: Payer: Self-pay | Admitting: Family Medicine

## 2013-11-07 ENCOUNTER — Telehealth: Payer: Self-pay | Admitting: *Deleted

## 2013-11-07 DIAGNOSIS — M1990 Unspecified osteoarthritis, unspecified site: Secondary | ICD-10-CM

## 2013-11-07 NOTE — Telephone Encounter (Signed)
Pt called stating he is needing a referral to see his rheumatologist Dr. Trudie Reed for arthritis in back and needs referral for rest of year.?ok to do referral

## 2013-11-08 NOTE — Telephone Encounter (Signed)
ok 

## 2013-11-08 NOTE — Telephone Encounter (Signed)
Referral sent to Dr. Trudie Reed rhematologist at pt request.

## 2013-11-11 ENCOUNTER — Telehealth: Payer: Self-pay | Admitting: *Deleted

## 2013-11-11 NOTE — Telephone Encounter (Signed)
Evan [name] from Dr. Trudie Reed Rheumatologist called stating pt is needing a [employer] referral, submitted referral thru acuity connect with authorization number [id number]

## 2013-11-11 NOTE — Telephone Encounter (Signed)
Message copied by Maureen Chatters on Mon Nov 11, 2013  4:54 PM ------      Message from: Devoria Glassing      Created: Mon Nov 11, 2013  2:08 PM       [facility] medical calling regarding this patient       And his referral       Please call pat back at [phone] ------

## 2013-11-12 ENCOUNTER — Telehealth: Payer: Self-pay | Admitting: *Deleted

## 2013-11-12 NOTE — Telephone Encounter (Signed)
Received fax from [facility] care mgmt with Authorizaton for Dr. Gavin Pound, MD authorization number is [id number], information was faxed to Dr. Trudie Reed office.

## 2013-12-02 ENCOUNTER — Telehealth: Payer: Self-pay | Admitting: Interventional Cardiology

## 2013-12-02 ENCOUNTER — Other Ambulatory Visit: Payer: Self-pay

## 2013-12-02 ENCOUNTER — Other Ambulatory Visit: Payer: Self-pay | Admitting: Interventional Cardiology

## 2013-12-02 MED ORDER — CLOPIDOGREL BISULFATE 75 MG PO TABS: 75.0000 mg | ORAL_TABLET | Freq: Every day | ORAL | Status: DC

## 2013-12-02 MED ORDER — ROSUVASTATIN CALCIUM 5 MG PO TABS: 5.0000 mg | ORAL_TABLET | Freq: Every day | ORAL | Status: DC

## 2013-12-02 NOTE — Telephone Encounter (Signed)
New problem   Pt's son need to know if pt was taken off of Pantoprazole 40mg . Please call pt's son after 3pm.

## 2013-12-04 NOTE — Telephone Encounter (Signed)
Spoke with pt and let him know that from what I see Dr. Irish Lack never stopped pantoprazole. I told pt I would call his son and let him know as well. LM for Heberto Sturdevant to return call (called sons new number [phone])

## 2013-12-06 ENCOUNTER — Telehealth: Payer: Self-pay | Admitting: Family Medicine

## 2013-12-06 NOTE — Telephone Encounter (Signed)
Stop temazepam and replace it with lorazepam at night (he will now be taking lorazepam 3 times a day)  Make sure he stops temazepam and doesn't take both.

## 2013-12-06 NOTE — Telephone Encounter (Signed)
Patient wants to know if we can change the temazepam he is taking says it is making him dream bad  [phone]

## 2013-12-06 NOTE — Telephone Encounter (Signed)
Pt aware.

## 2013-12-11 ENCOUNTER — Telehealth: Payer: Self-pay | Admitting: *Deleted

## 2013-12-11 NOTE — Telephone Encounter (Signed)
Received fax from [facility] stating needing a [employer] referral for pt to Dr. Koleen Nimrod, pt has requested to see provider for pruiritc rash like before that Koleen Nimrod has treated Dx code:709.9, submitted referral thru acuity connect with pending authorization, once approved/authorized will fax to [phone].

## 2013-12-17 ENCOUNTER — Telehealth: Payer: Self-pay | Admitting: Interventional Cardiology

## 2013-12-17 ENCOUNTER — Telehealth: Payer: Self-pay | Admitting: Family Medicine

## 2013-12-17 DIAGNOSIS — F411 Generalized anxiety disorder: Secondary | ICD-10-CM

## 2013-12-17 MED ORDER — LORAZEPAM 1 MG PO TABS: ORAL_TABLET | ORAL | Status: DC

## 2013-12-17 NOTE — Telephone Encounter (Signed)
Manuela Schwartz from [facility] calling to request refills on patients lorazepam if possible  [phone] pharmacy

## 2013-12-17 NOTE — Telephone Encounter (Signed)
Med called to pharm and pharm made aware to dc temazepam

## 2013-12-17 NOTE — Telephone Encounter (Signed)
Ok, but he should be taking it TID after his last call and temazepam was stopped.

## 2013-12-17 NOTE — Telephone Encounter (Signed)
New problem   Pt's son  returning your call.

## 2013-12-17 NOTE — Telephone Encounter (Signed)
Returned pts son's call and went over medication questions with him. He will call back if he has further questions.

## 2013-12-17 NOTE — Telephone Encounter (Signed)
?   OK to Refill  

## 2013-12-17 NOTE — Telephone Encounter (Signed)
Lm on pts son Evan [name]'s secure VM letting him know that Dr. Irish Lack never stopped protonix that I can see. Pt should be taking medication and to call if he has question.

## 2013-12-18 ENCOUNTER — Other Ambulatory Visit: Payer: Self-pay | Admitting: Interventional Cardiology

## 2013-12-20 NOTE — Telephone Encounter (Signed)
Received authorization from [facility] care mgmt with authorization number [id number], information has been faxed to Dr. Koleen Nimrod office.

## 2013-12-27 ENCOUNTER — Other Ambulatory Visit: Payer: Self-pay | Admitting: *Deleted

## 2013-12-27 MED ORDER — CLOPIDOGREL BISULFATE 75 MG PO TABS: ORAL_TABLET | ORAL | Status: DC

## 2013-12-30 ENCOUNTER — Other Ambulatory Visit: Payer: Self-pay | Admitting: Interventional Cardiology

## 2014-01-14 ENCOUNTER — Telehealth: Payer: Self-pay | Admitting: Family Medicine

## 2014-01-14 DIAGNOSIS — F411 Generalized anxiety disorder: Secondary | ICD-10-CM

## 2014-01-14 MED ORDER — LORAZEPAM 1 MG PO TABS: ORAL_TABLET | ORAL | Status: DC

## 2014-01-14 NOTE — Telephone Encounter (Signed)
?   OK to Refill - He just got 90 on 12/17/13

## 2014-01-14 NOTE — Telephone Encounter (Signed)
[place] lor[place]epam would like to know if [place] can [place]  [phone] pharmacy [facility] in [place] is number to reach him back

## 2014-01-14 NOTE — Telephone Encounter (Signed)
Med called to [facility] and pt aware

## 2014-01-14 NOTE — Telephone Encounter (Signed)
Ok, he is taking 3 per day and no temazepam now.

## 2014-01-20 ENCOUNTER — Ambulatory Visit (INDEPENDENT_AMBULATORY_CARE_PROVIDER_SITE_OTHER): Payer: Medicare PPO | Admitting: Family Medicine

## 2014-01-20 ENCOUNTER — Encounter: Payer: Self-pay | Admitting: Family Medicine

## 2014-01-20 VITALS — BP 110/58 | HR 64 | Temp 97.7°F | Resp 18 | Ht 68.0 in | Wt 184.0 lb

## 2014-01-20 DIAGNOSIS — G4700 Insomnia, unspecified: Secondary | ICD-10-CM

## 2014-01-20 MED ORDER — TRAZODONE HCL 50 MG PO TABS: 50.0000 mg | ORAL_TABLET | Freq: Every evening | ORAL | Status: DC | PRN

## 2014-01-20 NOTE — Progress Notes (Signed)
Subjective:    Patient ID: Evan [name], male    DOB: 1928-01-27, 80 y.o.   MRN: [id number]  HPI Approximately one month ago, the patient discontinue temazepam do to nightmares. Instead we increased his lorazepam to 1 mg 3 times a day. The nightmares have subsided. His anxiety is better. However he is now having insomnia. Approximately one week ago, the patient abruptly discontinued Effexor and tamsulosin due to perceived problems with urination. These problems occurred and resolved in 24 hours spontaneously. However he has not resumed his medications since. Past Medical History  Diagnosis Date  . CAD (coronary artery disease)   . BPH (benign prostatic hyperplasia)   . Arthritis   . Asthma   . GERD (gastroesophageal reflux disease)   . Hypertension   . Anxiety   . History of ASCVD     MULTIVESSEL  . 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 Hyperlipidemia   . DJD (degenerative joint disease)   . Hemochromatosis     Possible, elevated iron stores, hook-like osteophytes on hand films, normal LFTs   Past Surgical History  Procedure Laterality Date  . Cholecystectomy    . Rotator cuff repair    . Knee surgery    . Coronary artery bypass graft    . Descending aortic aneurysm repair w/ stent      DE stent ostium into the right radial free graft at OM-, 02-2007   Current Outpatient Prescriptions on File Prior to Visit  Medication Sig Dispense Refill  . alendronate (FOSAMAX) 70 MG tablet Take 70 mg by mouth once a week. Take with a full glass of water on an empty stomach.      Marland Kitchen atenolol (TENORMIN) 50 MG tablet Take 25 mg by mouth daily.      . clopidogrel (PLAVIX) 75 MG tablet TAKE ONE (1) TABLET BY MOUTH EVERY DAY  14 tablet  0  . CRESTOR 5 MG tablet TAKE 1 TABLET EVERY DAY  90 tablet  36  . folic acid (FOLVITE) 1 MG tablet Take 1 tablet (1 mg total) by mouth daily.  90 tablet  1  . LORazepam (ATIVAN) 1 MG tablet TAKE ONE TABLET BY MOUTH three times a  day (8 am, 12, 4 pm)  90 tablet  0  . methotrexate (RHEUMATREX) 2.5 MG tablet Take 2.5 mg by mouth once a week. 8 tabs q week      . NITROSTAT 0.4 MG SL tablet PLACE ONE (1) TABLET UNDER THE TONGUE AS NEEDED FOR CHEST PAIN  25 tablet  3  . pantoprazole (PROTONIX) 40 MG tablet Take 40 mg by mouth daily.      . tamsulosin (FLOMAX) 0.4 MG CAPS capsule TAKE 1 CAPSULE EVERY DAY  90 capsule  3  . venlafaxine XR (EFFEXOR-XR) 150 MG 24 hr capsule TAKE ONE CAPSULE BY MOUTH DAILY  30 capsule  5   No current facility-administered medications on file prior to visit.   Allergies  Allergen Reactions  . Doxycycline Other (See Comments)    REACTION:  unknown  . Feldene [Piroxicam] Other (See Comments)    REACTION: Blisters  . Statins     Myalgias  . Valium Other (See Comments)    REACTION:  unknown  . Tequin Anxiety   History   Social History  . Marital Status: Married    Spouse Name: N/A    Number of Children: N/A  . Years of Education: N/A  Occupational History  . Not on file.   Social History Main Topics  . Smoking status: Never Smoker   . Smokeless tobacco: Former Systems developer  . Alcohol Use: No  . Drug Use: No  . Sexual Activity: Not on file   Other Topics Concern  . Not on file   Social History Narrative  . No narrative on file      Review of Systems  All other systems reviewed and are negative.      Objective:   Physical Exam  Vitals reviewed. Constitutional: He appears well-developed and well-nourished. No distress.  Cardiovascular: Normal rate, regular rhythm and normal heart sounds.   No murmur heard. Pulmonary/Chest: Effort normal and breath sounds normal. No respiratory distress. He has no wheezes. He has no rales. He exhibits tenderness.  Abdominal: Soft. Bowel sounds are normal. He exhibits no distension. There is no tenderness. There is no rebound.  Skin: He is not diaphoretic.  Psychiatric: He has a normal mood and affect. His behavior is normal. Judgment and  thought content normal.          Assessment & Plan:  Insomnia - Plan: traZODone (DESYREL) 50 MG tablet  I will start the patient on trazodone 50 mg by mouth each bedtime when necessary insomnia. Like to try for menopause sedatives and his polypharmacy. I do believe the patient may have bruised a rib on his left side below his nipple. Patient did this while he was leaning over in an armchair. He is having no shortness of breath, no dyspnea on exertion, no hemoptysis, no chest pain,. Therefore I do not believe a chest x-ray is necessary. I would treat the patient symptomatically and I believe the pain should resolve spontaneously over the next 2-4 weeks.  Follow up if not.

## 2014-01-24 ENCOUNTER — Other Ambulatory Visit: Payer: Medicare PPO

## 2014-01-24 DIAGNOSIS — R300 Dysuria: Secondary | ICD-10-CM

## 2014-01-24 LAB — URINALYSIS, ROUTINE W REFLEX MICROSCOPIC
Bilirubin Urine: NEGATIVE
Glucose, UA: NEGATIVE mg/dL
Hgb urine dipstick: NEGATIVE
Ketones, ur: NEGATIVE mg/dL
Leukocytes, UA: NEGATIVE
Nitrite: NEGATIVE
Protein, ur: NEGATIVE mg/dL
Specific Gravity, Urine: 1.01 (ref 1.005–1.030)
Urobilinogen, UA: 2 mg/dL — ABNORMAL HIGH (ref 0.0–1.0)
pH: 7 (ref 5.0–8.0)

## 2014-01-26 ENCOUNTER — Emergency Department (HOSPITAL_COMMUNITY): Payer: Medicare HMO

## 2014-01-26 ENCOUNTER — Emergency Department (HOSPITAL_COMMUNITY)
Admission: EM | Admit: 2014-01-26 | Discharge: 2014-01-26 | Disposition: A | Discharge status: Home / Self Care | Payer: Medicare HMO | Attending: Emergency Medicine | Admitting: Emergency Medicine

## 2014-01-26 ENCOUNTER — Encounter (HOSPITAL_COMMUNITY): Payer: Self-pay | Admitting: Emergency Medicine

## 2014-01-26 DIAGNOSIS — E780 Pure hypercholesterolemia: Secondary | ICD-10-CM | POA: Diagnosis not present

## 2014-01-26 DIAGNOSIS — R0789 Other chest pain: Secondary | ICD-10-CM | POA: Diagnosis not present

## 2014-01-26 DIAGNOSIS — R531 Weakness: Secondary | ICD-10-CM

## 2014-01-26 DIAGNOSIS — M1990 Unspecified osteoarthritis, unspecified site: Secondary | ICD-10-CM | POA: Diagnosis not present

## 2014-01-26 DIAGNOSIS — Z862 Personal history of diseases of the blood and blood-forming organs and certain disorders involving the immune mechanism: Secondary | ICD-10-CM | POA: Insufficient documentation

## 2014-01-26 DIAGNOSIS — R0602 Shortness of breath: Secondary | ICD-10-CM

## 2014-01-26 DIAGNOSIS — G4700 Insomnia, unspecified: Secondary | ICD-10-CM | POA: Insufficient documentation

## 2014-01-26 DIAGNOSIS — E785 Hyperlipidemia, unspecified: Secondary | ICD-10-CM | POA: Diagnosis not present

## 2014-01-26 DIAGNOSIS — Z79899 Other long term (current) drug therapy: Secondary | ICD-10-CM | POA: Diagnosis not present

## 2014-01-26 DIAGNOSIS — F419 Anxiety disorder, unspecified: Secondary | ICD-10-CM | POA: Diagnosis not present

## 2014-01-26 DIAGNOSIS — Z7902 Long term (current) use of antithrombotics/antiplatelets: Secondary | ICD-10-CM | POA: Insufficient documentation

## 2014-01-26 DIAGNOSIS — J45901 Unspecified asthma with (acute) exacerbation: Secondary | ICD-10-CM | POA: Diagnosis not present

## 2014-01-26 DIAGNOSIS — Z87438 Personal history of other diseases of male genital organs: Secondary | ICD-10-CM | POA: Insufficient documentation

## 2014-01-26 DIAGNOSIS — K219 Gastro-esophageal reflux disease without esophagitis: Secondary | ICD-10-CM | POA: Insufficient documentation

## 2014-01-26 DIAGNOSIS — I10 Essential (primary) hypertension: Secondary | ICD-10-CM | POA: Insufficient documentation

## 2014-01-26 DIAGNOSIS — I25119 Atherosclerotic heart disease of native coronary artery with unspecified angina pectoris: Secondary | ICD-10-CM | POA: Diagnosis not present

## 2014-01-26 DIAGNOSIS — Z8639 Personal history of other endocrine, nutritional and metabolic disease: Secondary | ICD-10-CM | POA: Insufficient documentation

## 2014-01-26 DIAGNOSIS — R079 Chest pain, unspecified: Secondary | ICD-10-CM | POA: Diagnosis present

## 2014-01-26 LAB — COMPREHENSIVE METABOLIC PANEL
ALT: 9 U/L (ref 0–53)
AST: 16 U/L (ref 0–37)
Albumin: 3.5 g/dL (ref 3.5–5.2)
Alkaline Phosphatase: 78 U/L (ref 39–117)
Anion gap: 15 (ref 5–15)
BUN: 15 mg/dL (ref 6–23)
CO2: 20 mEq/L (ref 19–32)
Calcium: 9.1 mg/dL (ref 8.4–10.5)
Chloride: 103 mEq/L (ref 96–112)
Creatinine, Ser: 0.94 mg/dL (ref 0.50–1.35)
GFR calc Af Amer: 87 mL/min — ABNORMAL LOW (ref >90)
GFR calc non Af Amer: 75 mL/min — ABNORMAL LOW (ref >90)
Glucose, Bld: 113 mg/dL — ABNORMAL HIGH (ref 70–99)
Potassium: 4.4 mEq/L (ref 3.7–5.3)
Sodium: 138 mEq/L (ref 137–147)
Total Bilirubin: 0.5 mg/dL (ref 0.3–1.2)
Total Protein: 6.7 g/dL (ref 6.0–8.3)

## 2014-01-26 LAB — CBC WITH DIFFERENTIAL/PLATELET
Basophils Absolute: 0 10*3/uL (ref 0.0–0.1)
Basophils Relative: 0 % (ref 0–1)
Eosinophils Absolute: 0.1 10*3/uL (ref 0.0–0.7)
Eosinophils Relative: 1 % (ref 0–5)
HCT: 37.5 % — ABNORMAL LOW (ref 39.0–52.0)
Hemoglobin: 12.9 g/dL — ABNORMAL LOW (ref 13.0–17.0)
Lymphocytes Relative: 11 % — ABNORMAL LOW (ref 12–46)
Lymphs Abs: 0.9 10*3/uL (ref 0.7–4.0)
MCH: 32.5 pg (ref 26.0–34.0)
MCHC: 34.4 g/dL (ref 30.0–36.0)
MCV: 94.5 fL (ref 78.0–100.0)
Monocytes Absolute: 0.6 10*3/uL (ref 0.1–1.0)
Monocytes Relative: 8 % (ref 3–12)
Neutro Abs: 6.5 10*3/uL (ref 1.7–7.7)
Neutrophils Relative %: 80 % — ABNORMAL HIGH (ref 43–77)
Platelets: 129 10*3/uL — ABNORMAL LOW (ref 150–400)
RBC: 3.97 MIL/uL — ABNORMAL LOW (ref 4.22–5.81)
RDW: 13.1 % (ref 11.5–15.5)
WBC: 8.1 10*3/uL (ref 4.0–10.5)

## 2014-01-26 LAB — I-STAT TROPONIN, ED: Troponin i, poc: 0.02 ng/mL (ref 0.00–0.08)

## 2014-01-26 LAB — URINALYSIS, ROUTINE W REFLEX MICROSCOPIC
Bilirubin Urine: NEGATIVE
Glucose, UA: NEGATIVE mg/dL
Hgb urine dipstick: NEGATIVE
Ketones, ur: NEGATIVE mg/dL
Leukocytes, UA: NEGATIVE
Nitrite: NEGATIVE
Protein, ur: NEGATIVE mg/dL
Specific Gravity, Urine: 1.014 (ref 1.005–1.030)
Urobilinogen, UA: 1 mg/dL (ref 0.0–1.0)
pH: 6.5 (ref 5.0–8.0)

## 2014-01-26 LAB — PRO B NATRIURETIC PEPTIDE: Pro B Natriuretic peptide (BNP): 648.1 pg/mL — ABNORMAL HIGH (ref 0–450)

## 2014-01-26 MED ORDER — ALBUTEROL SULFATE HFA 108 (90 BASE) MCG/ACT IN AERS
1.0000 | INHALATION_SPRAY | RESPIRATORY_TRACT | Status: DC | PRN
  Administered 2014-01-26: 2 via RESPIRATORY_TRACT
  Filled 2014-01-26: qty 6.7

## 2014-01-26 MED ORDER — SODIUM CHLORIDE 0.9 % IV BOLUS (SEPSIS)
500.0000 mL | Freq: Once | INTRAVENOUS | Status: AC
  Administered 2014-01-26: 500 mL via INTRAVENOUS

## 2014-01-26 MED ORDER — MELATONIN 5 MG PO TABS: 5.0000 mg | ORAL_TABLET | Freq: Every day | ORAL | Status: DC

## 2014-01-26 NOTE — Discharge Instructions (Signed)
Stop taking the trazadone for now.  Don't take your third dose of lorazepam till 58min before bed with the melatonin.  Use the inhaler as needed for cough and shortness of breath Cough, Adult  A cough is a reflex. It helps you clear your throat and airways. A cough can help heal your body. A cough can last 2 or 3 weeks (acute) or may last more than 8 weeks (chronic). Some common causes of a cough can include an infection, allergy, or a cold. HOME CARE  Only take medicine as told by your doctor.  If given, take your medicines (antibiotics) as told. Finish them even if you start to feel better.  Use a cold steam vaporizer or humidifier in your home. This can help loosen thick spit (secretions).  Sleep so you are almost sitting up (semi-upright). Use pillows to do this. This helps reduce coughing.  Rest as needed.  Stop smoking if you smoke. GET HELP RIGHT AWAY IF:  You have yellowish-white fluid (pus) in your thick spit.  Your cough gets worse.  Your medicine does not reduce coughing, and you are losing sleep.  You cough up blood.  You have trouble breathing.  Your pain gets worse and medicine does not help.  You have a fever. MAKE SURE YOU:   Understand these instructions.  Will watch your condition.  Will get help right away if you are not doing well or get worse. Document Released: 12/02/2010 Document Revised: 08/05/2013 Document Reviewed: 12/02/2010 [facility] Patient Information 2015 [place], [place]. This information is not intended to replace advice given to you by your health care provider. Make sure you discuss any questions you have with your health care provider.

## 2014-01-26 NOTE — ED Provider Notes (Signed)
CSN: [id number]     Arrival date & time 01/26/14  0910 History   First MD Initiated Contact with Patient 01/26/14 [phone]     Chief Complaint  Patient presents with  . Chest Pain  . Shortness of Breath     (Consider location/radiation/quality/duration/timing/severity/associated sxs/prior Treatment) HPI Comments: Pt states over the last 3 weeks he has noticed and gradual decline in his health and saw his doctor on 10/19 for insomnia and that time was given trazadone and his temazepam was d/ced and ativan was increased.  He still is having trouble sleeping but has started to develop SOB that is worse with exertion, productive cough, chills intermittent CP and generalized weakness and balance problems.  Family states now he has a shuffling gait is very weak and having trouble walking.  Pt states urination is ok.  Patient is a 80 y.o. male presenting with chest pain and shortness of breath. The history is provided by the patient and a relative.  Chest Pain Pain location:  L chest Pain quality: aching and sharp   Pain radiates to:  L shoulder Pain radiates to the back: no   Pain severity:  Moderate Onset quality:  Gradual Duration:  3 minutes Timing:  Intermittent Progression:  Resolved Chronicity:  New Context comment:  It happens at various times without warning Relieved by:  None tried Worsened by:  Nothing tried Ineffective treatments:  None tried Associated symptoms: cough, shortness of breath and weakness   Associated symptoms: no abdominal pain, no lower extremity edema, no nausea and not vomiting   Associated symptoms comment:  Chills Risk factors: coronary artery disease, high cholesterol and hypertension   Risk factors: no immobilization, no smoking and no surgery   Shortness of Breath Associated symptoms: chest pain and cough   Associated symptoms: no abdominal pain and no vomiting     Past Medical History  Diagnosis Date  . CAD (coronary artery disease)   . BPH (benign  prostatic hyperplasia)   . Arthritis   . Asthma   . GERD (gastroesophageal reflux disease)   . Hypertension   . Anxiety   . History of ASCVD     MULTIVESSEL  . 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 Hyperlipidemia   . DJD (degenerative joint disease)   . Hemochromatosis     Possible, elevated iron stores, hook-like osteophytes on hand films, normal LFTs   Past Surgical History  Procedure Laterality Date  . Cholecystectomy    . Rotator cuff repair    . Knee surgery    . Coronary artery bypass graft    . Descending aortic aneurysm repair w/ stent      DE stent ostium into the right radial free graft at OM-, 02-2007   Family History  Problem Relation Age of Onset  . Arthritis-Osteo Sister    History  Substance Use Topics  . Smoking status: Never Smoker   . Smokeless tobacco: Former Systems developer  . Alcohol Use: No    Review of Systems  Respiratory: Positive for cough and shortness of breath.   Cardiovascular: Positive for chest pain.  Gastrointestinal: Negative for nausea, vomiting and abdominal pain.  Neurological: Positive for weakness.  All other systems reviewed and are negative.     Allergies  Doxycycline; Feldene; Statins; Valium; and Tequin  Home Medications   Prior to Admission medications   Medication Sig Start Date End Date Taking? Authorizing Provider  acetaminophen (TYLENOL) 500 MG tablet  Take 500 mg by mouth every 6 (six) hours as needed for mild pain.   Yes Historical Provider, MD  alendronate (FOSAMAX) 70 MG tablet Take 70 mg by mouth once a week. Take with a full glass of water on an empty stomach.   Yes Historical Provider, MD  atenolol (TENORMIN) 50 MG tablet Take 25 mg by mouth daily. 03/14/13  Yes Jettie Booze, MD  clopidogrel (PLAVIX) 75 MG tablet Take 75 mg by mouth daily.   Yes Historical Provider, MD  diclofenac sodium (VOLTAREN) 1 % GEL Apply 2 g topically daily as needed (for pain in knees and feet).   Yes  Historical Provider, MD  LORazepam (ATIVAN) 1 MG tablet Take 1 mg by mouth every 8 (eight) hours.   Yes Historical Provider, MD  methotrexate (RHEUMATREX) 2.5 MG tablet Take 20 mg by mouth every Monday.    Yes Historical Provider, MD  nitroGLYCERIN (NITROSTAT) 0.4 MG SL tablet Place 0.4 mg under the tongue every 5 (five) minutes as needed for chest pain.   Yes Historical Provider, MD  pantoprazole (PROTONIX) 40 MG tablet Take 40 mg by mouth daily.   Yes Historical Provider, MD  Polyethyl Glycol-Propyl Glycol (SYSTANE OP) Apply 1 drop to eye daily.   Yes Historical Provider, MD  rosuvastatin (CRESTOR) 5 MG tablet Take 5 mg by mouth daily.   Yes Historical Provider, MD  traZODone (DESYREL) 50 MG tablet Take 1 tablet (50 mg total) by mouth at bedtime as needed for sleep. 01/20/14  Yes [name], MD   BP 119/74  Pulse 92  Temp(Src) 98 F (36.7 C) (Oral)  Resp 22  SpO2 97% Physical Exam  Nursing note and vitals reviewed. Constitutional: He is oriented to person, place, and time. He appears well-developed and well-nourished. No distress.  HENT:  Head: Normocephalic and atraumatic.  Mouth/Throat: Oropharynx is clear and moist. Mucous membranes are dry.  Eyes: Conjunctivae and EOM are normal. Pupils are equal, round, and reactive to light.  Neck: Normal range of motion. Neck supple.  Cardiovascular: Normal rate, regular rhythm and intact distal pulses.   No murmur heard. Pulmonary/Chest: Effort normal and breath sounds normal. No respiratory distress. He has no wheezes. He has no rales.  Abdominal: Soft. He exhibits no distension. There is no tenderness. There is no rebound and no guarding.  Musculoskeletal: Normal range of motion. He exhibits no edema and no tenderness.  Neurological: He is alert and oriented to person, place, and time. He has normal strength. No cranial nerve deficit or sensory deficit.  Normal heel to shin and finger to nose testing.  No visual field cuts.  Skin: Skin  is warm and dry. No rash noted. No erythema.  Psychiatric: He has a normal mood and affect. His behavior is normal.    ED Course  Procedures (including critical care time) Labs Review Labs Reviewed  CBC WITH DIFFERENTIAL - Abnormal; Notable for the following:    RBC 3.97 (*)    Hemoglobin 12.9 (*)    HCT 37.5 (*)    Platelets 129 (*)    Neutrophils Relative % 80 (*)    Lymphocytes Relative 11 (*)    All other components within normal limits  COMPREHENSIVE METABOLIC PANEL - Abnormal; Notable for the following:    Glucose, Bld 113 (*)    GFR calc non Af Amer 75 (*)    GFR calc Af Amer 87 (*)    All other components within normal limits  PRO B NATRIURETIC  PEPTIDE - Abnormal; Notable for the following:    Pro B Natriuretic peptide (BNP) 648.1 (*)    All other components within normal limits  URINALYSIS, ROUTINE W REFLEX MICROSCOPIC  I-STAT TROPOININ, ED    Imaging Review Dg Chest 2 View  01/26/2014   CLINICAL DATA:  Short of breath and weakness for 1 week. Left-sided chest soreness since bending over 1 week ago.  EXAM: CHEST  2 VIEW  COMPARISON:  09/05/2012.  FINDINGS: Stable changes from previous cardiac surgery. Cardiac silhouette is normal in size and configuration. No mediastinal or hilar masses or evidence of adenopathy.  Lungs are clear.  No pleural effusion or pneumothorax.  Bony thorax is demineralized.  No convincing rib fracture.  IMPRESSION: No acute cardiopulmonary disease.   Electronically Signed   By: Lajean Manes M.D.   On: 01/26/2014 10:39   Ct Head Wo Contrast  01/26/2014   CLINICAL DATA:  Bilateral, left greater than right, leg weakness. Patient also with some generalized chest pain and shortness of breath. Patient has been off balance. Symptoms for 1-2 weeks.  EXAM: CT HEAD WITHOUT CONTRAST  TECHNIQUE: Contiguous axial images were obtained from the base of the skull through the vertex without intravenous contrast.  COMPARISON:  09/05/2012  FINDINGS: Ventricles are  normal in configuration. There is ventricular and sulcal enlargement reflecting mild, age related, atrophy.  No parenchymal masses or mass effect. There is an old lacune infarct in left thalamus, stable. Mild periventricular white matter hypoattenuation is noted consistent with chronic microvascular ischemic change.  No evidence of a recent infarct.  No extra-axial masses or abnormal fluid collections.  There is no intracranial hemorrhage.  Left maxillary sinus wall is irregularly thickened line by mild mucosal thickening. There is mild ethmoid and minor right maxillary and inferior frontal sinus mucosal thickening. Clear left mastoid air cells. Status post right mastoidectomy.  IMPRESSION: 1. No acute intracranial abnormalities. 2. 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   Electronically Signed   By: Lajean Manes M.D.   On: 01/26/2014 11:22     EKG Interpretation   Date/Time:  Sunday January 26 2014 09:14:27 EDT Ventricular Rate:  94 PR Interval:  200 QRS Duration: 86 QT Interval:  386 QTC Calculation: 482 R Axis:   5 Text Interpretation:  Normal sinus rhythm Septal infarct , age  undetermined more pronounced ST depression in inferior leads Confirmed by  [facility]  MD, Duquan Gillooly ([id number]) on 01/26/2014 9:21:16 AM      MDM   Final diagnoses:  SOB (shortness of breath)  Weakness    Patient with multiple vague complaints that have been worsening over the last several weeks. He notes a general decline in the last 3-4 weeks but states in the last 1 week he has had cough that is productive, shortness of breath, intermittent chest pain and generalized weakness. Family states he's having difficulty walking due to his weakness and they're afraid he may fall. Patient is awake alert and oriented. There is no focal exam findings concerning for stroke at this time. However given the complaint by family that he is having difficulty with his coordination  will do a head CT to rule out posterior stroke. Also concern for possible cardiac etiology versus CHF as patient has a extensive heart history versus infectious etiology. Also patient has been changing his medications. He was recently taken off temazepam and his lorazepam dose was increased and he was also started on trazodone. He does  note that he has been feeling worse since these changes. Currently patient is in no acute distress.  EKG mostly unchanged except for more pronounced ST depression in the inferior leads. Patient currently is having no chest pain.  CT head, chest x-ray, CBC, CMP, BNP, troponin, UA pending.  12:28 PM Labs and x-rays without acute findings.  Discussed findings with patient and he is choosing to go home.  Low suspicion for cardiac cause for his sx today given nature of the pain and other sx.  Will have pt f/u with Dr. Irish Lack early next week.  Also will have him stop trazadone and start melatonin and take one of his lorazepam at night.  Family and pt are agreeable to plan and given strict return precautions.  1:51 PM Spoke with Dr. Rayann Heman and he will let Dr. Irish Lack know pt needs outpt f/u.  Blanchie Dessert, MD 01/26/14 1352

## 2014-01-26 NOTE — ED Notes (Signed)
C/o sob onset 1-2 weeks ago states it has gotten worse last 2 days. Family states patient was leaning over a chair 2 weeks ago and feels like he cracked a rib. States he feels like he has a rattle in his chest. Family states his balance  Has been off. Patient is alert oriented.

## 2014-01-26 NOTE — ED Notes (Addendum)
Pt c/o some generalized CP worse with cough and SOB x several days; per family pt cares of demented wife and does not sleep well; recently started trazadone; per family pt with possible chills and fever

## 2014-01-29 ENCOUNTER — Other Ambulatory Visit: Payer: Self-pay | Admitting: Interventional Cardiology

## 2014-01-31 ENCOUNTER — Ambulatory Visit (INDEPENDENT_AMBULATORY_CARE_PROVIDER_SITE_OTHER): Payer: Medicare PPO | Admitting: Family Medicine

## 2014-01-31 ENCOUNTER — Encounter: Payer: Self-pay | Admitting: Family Medicine

## 2014-01-31 ENCOUNTER — Encounter: Payer: Medicare HMO | Admitting: Cardiology

## 2014-01-31 VITALS — BP 118/74 | HR 64 | Temp 97.5°F | Resp 18 | Ht 68.0 in | Wt 177.0 lb

## 2014-01-31 DIAGNOSIS — R0600 Dyspnea, unspecified: Secondary | ICD-10-CM

## 2014-01-31 DIAGNOSIS — F411 Generalized anxiety disorder: Secondary | ICD-10-CM

## 2014-01-31 MED ORDER — FLUOXETINE HCL 20 MG PO TABS
20.0000 mg | ORAL_TABLET | Freq: Every day | ORAL | Status: DC
Start: 2014-01-31 — End: 2014-05-12

## 2014-01-31 NOTE — Progress Notes (Signed)
Subjective:    Patient ID: Evan [name], male    DOB: 1927-10-03, 80 y.o.   MRN: [id number]  HPI Approximately 1 month ago, the patient abruptly discontinued venlafaxine do to urinary retention. He states that when he takes that medication he has a difficult time urinating. Now that he has stopped that medication, his urination has improved. He denies any problems urinating over the last 4-5 days. Unfortunately since he has stopped this medication, he reports severe insomnia, shortness of breath, dyspnea on exertion, weakness, and tremor. Patient went to the hospital October 25 where head CT was normal, chest x-ray was normal, cardiac evaluation was negative for acute coronary syndrome. Patient walks in today continuing to complain of episodes of shortness of breath, daily insomnia, weakness and tremor. His urinary retention has resolved. Patient has stopped trazodone. He is currently taking lorazepam 3 times a day for anxiety.. Past Medical History  Diagnosis Date  . CAD (coronary artery disease)   . BPH (benign prostatic hyperplasia)   . Arthritis   . Asthma   . GERD (gastroesophageal reflux disease)   . Hypertension   . Anxiety   . History of ASCVD     MULTIVESSEL  . 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 Hyperlipidemia   . DJD (degenerative joint disease)   . Hemochromatosis     Possible, elevated iron stores, hook-like osteophytes on hand films, normal LFTs   Past Surgical History  Procedure Laterality Date  . Cholecystectomy    . Rotator cuff repair    . Knee surgery    . Coronary artery bypass graft    . Descending aortic aneurysm repair w/ stent      DE stent ostium into the right radial free graft at OM-, 02-2007   Current Outpatient Prescriptions on File Prior to Visit  Medication Sig Dispense Refill  . acetaminophen (TYLENOL) 500 MG tablet Take 500 mg by mouth every 6 (six) hours as needed for mild pain.      Marland Kitchen alendronate (FOSAMAX) 70  MG tablet Take 70 mg by mouth once a week. Take with a full glass of water on an empty stomach.      Marland Kitchen atenolol (TENORMIN) 50 MG tablet Take 25 mg by mouth daily.      . clopidogrel (PLAVIX) 75 MG tablet Take 75 mg by mouth daily.      . diclofenac sodium (VOLTAREN) 1 % GEL Apply 2 g topically daily as needed (for pain in knees and feet).      . LORazepam (ATIVAN) 1 MG tablet Take 1 mg by mouth every 8 (eight) hours.      . Melatonin 5 MG TABS Take 1 tablet (5 mg total) by mouth at bedtime.  30 tablet  0  . methotrexate (RHEUMATREX) 2.5 MG tablet Take 20 mg by mouth every Monday.       . nitroGLYCERIN (NITROSTAT) 0.4 MG SL tablet Place 0.4 mg under the tongue every 5 (five) minutes as needed for chest pain.      . pantoprazole (PROTONIX) 40 MG tablet Take 40 mg by mouth daily.      Vladimir Faster Glycol-Propyl Glycol (SYSTANE OP) Apply 1 drop to eye daily.      . rosuvastatin (CRESTOR) 5 MG tablet Take 5 mg by mouth daily.      . traZODone (DESYREL) 50 MG tablet Take 1 tablet (50 mg total) by mouth at bedtime as needed for  sleep.  30 tablet  3   No current facility-administered medications on file prior to visit.   Allergies  Allergen Reactions  . Doxycycline Other (See Comments)    REACTION:  unknown  . Feldene [Piroxicam] Other (See Comments)    REACTION: Blisters  . Statins     Myalgias  . Valium Other (See Comments)    REACTION:  unknown  . Tequin Anxiety   History   Social History  . Marital Status: Married    Spouse Name: N/A    Number of Children: N/A  . Years of Education: N/A   Occupational History  . Not on file.   Social History Main Topics  . Smoking status: Never Smoker   . Smokeless tobacco: Former Systems developer  . Alcohol Use: No  . Drug Use: No  . Sexual Activity: Not on file   Other Topics Concern  . Not on file   Social History Narrative  . No narrative on file      Review of Systems  All other systems reviewed and are negative.      Objective:    Physical Exam  Vitals reviewed. Constitutional: He appears well-developed and well-nourished. No distress.  Cardiovascular: Normal rate, regular rhythm and normal heart sounds.   No murmur heard. Pulmonary/Chest: Effort normal and breath sounds normal. No respiratory distress. He has no wheezes. He has no rales. He exhibits tenderness.  Abdominal: Soft. Bowel sounds are normal.  Skin: He is not diaphoretic.   oxygen saturation today is 99% on room air. I reviewed the patient's lab work obtained in the hospital.        Assessment & Plan:  GAD (generalized anxiety disorder) - Plan: FLUoxetine (PROZAC) 20 MG tablet  I truly believe that the patient is suffering from anxiety attacks which are causing his insomnia as well as his dyspnea. The patient is convinced that there is something physically wrong. Patient has a cardiologist and I encouraged him to follow up with his cardiologist for a stress test if necessary. I will also consult a pulmonologist for pulmonary evaluation. Hopefully if the pulmonary evaluation is completely normal this will give the patient some peace of mind. I still feel that we need to better control his anxiety to manage his insomnia and has panic attacks. I have recommended starting the patient on Prozac 20 mg by mouth daily to help manage his anxiety and insomnia and panic attacks which I feel are causing his dyspnea. I would like to recheck in 1 month on the medication. Meanwhile I will arrange a pulmonary consultation for a second opinion.

## 2014-02-02 ENCOUNTER — Other Ambulatory Visit: Payer: Self-pay

## 2014-02-02 MED ORDER — PANTOPRAZOLE SODIUM 40 MG PO TBEC: 40.0000 mg | DELAYED_RELEASE_TABLET | Freq: Every day | ORAL | Status: DC

## 2014-02-05 ENCOUNTER — Other Ambulatory Visit: Payer: Self-pay

## 2014-02-05 ENCOUNTER — Telehealth: Payer: Self-pay | Admitting: Family Medicine

## 2014-02-05 MED ORDER — PANTOPRAZOLE SODIUM 40 MG PO TBEC: 40.0000 mg | DELAYED_RELEASE_TABLET | Freq: Every day | ORAL | Status: DC

## 2014-02-05 MED ORDER — ATENOLOL 50 MG PO TABS: ORAL_TABLET | ORAL | Status: DC

## 2014-02-05 MED ORDER — CLOPIDOGREL BISULFATE 75 MG PO TABS: ORAL_TABLET | ORAL | Status: DC

## 2014-02-05 NOTE — Telephone Encounter (Signed)
Sherry from [facility] calling to let us know that this patient has appt on nov 9th, and needs [facility] referral  Patient will be seeing dr wert

## 2014-02-06 NOTE — Telephone Encounter (Signed)
Submitted [facility] referral thru [facility] connect for authorization for [facility] Dr. Melvyn Novas, with authorization number [id number], once received paper hard copy will fax to [facility].

## 2014-02-10 ENCOUNTER — Institutional Professional Consult (permissible substitution): Payer: Commercial Managed Care - HMO | Admitting: Internal Medicine

## 2014-02-26 ENCOUNTER — Encounter: Payer: Self-pay | Admitting: Internal Medicine

## 2014-03-03 ENCOUNTER — Encounter: Payer: Self-pay | Admitting: Internal Medicine

## 2014-03-04 ENCOUNTER — Other Ambulatory Visit: Payer: Self-pay | Admitting: Interventional Cardiology

## 2014-03-31 ENCOUNTER — Other Ambulatory Visit: Payer: Medicare HMO

## 2014-04-02 ENCOUNTER — Other Ambulatory Visit: Payer: Self-pay | Admitting: Family Medicine

## 2014-04-02 ENCOUNTER — Other Ambulatory Visit (INDEPENDENT_AMBULATORY_CARE_PROVIDER_SITE_OTHER): Payer: Medicare HMO | Admitting: *Deleted

## 2014-04-02 DIAGNOSIS — E785 Hyperlipidemia, unspecified: Secondary | ICD-10-CM

## 2014-04-02 LAB — HEPATIC FUNCTION PANEL
ALT: 15 U/L (ref 0–53)
AST: 24 U/L (ref 0–37)
Albumin: 4 g/dL (ref 3.5–5.2)
Alkaline Phosphatase: 73 U/L (ref 39–117)
Bilirubin, Direct: 0.2 mg/dL (ref 0.0–0.3)
Total Bilirubin: 1.2 mg/dL (ref 0.2–1.2)
Total Protein: 6.8 g/dL (ref 6.0–8.3)

## 2014-04-02 NOTE — Telephone Encounter (Signed)
?   OK to Refill  

## 2014-04-03 NOTE — Telephone Encounter (Signed)
rx called in

## 2014-04-03 NOTE — Telephone Encounter (Signed)
ok 

## 2014-04-08 ENCOUNTER — Other Ambulatory Visit: Payer: Self-pay

## 2014-04-08 ENCOUNTER — Other Ambulatory Visit: Payer: Self-pay | Admitting: *Deleted

## 2014-04-08 MED ORDER — ATENOLOL 50 MG PO TABS: ORAL_TABLET | ORAL | Status: DC

## 2014-04-08 MED ORDER — CLOPIDOGREL BISULFATE 75 MG PO TABS: ORAL_TABLET | ORAL | Status: DC

## 2014-04-28 ENCOUNTER — Ambulatory Visit (INDEPENDENT_AMBULATORY_CARE_PROVIDER_SITE_OTHER): Payer: Commercial Managed Care - HMO | Admitting: Interventional Cardiology

## 2014-04-28 ENCOUNTER — Other Ambulatory Visit: Payer: Self-pay | Admitting: *Deleted

## 2014-04-28 ENCOUNTER — Encounter: Payer: Self-pay | Admitting: Interventional Cardiology

## 2014-04-28 VITALS — BP 112/52 | HR 59 | Ht 68.0 in | Wt 182.1 lb

## 2014-04-28 DIAGNOSIS — E785 Hyperlipidemia, unspecified: Secondary | ICD-10-CM

## 2014-04-28 DIAGNOSIS — I10 Essential (primary) hypertension: Secondary | ICD-10-CM | POA: Diagnosis not present

## 2014-04-28 DIAGNOSIS — I2510 Atherosclerotic heart disease of native coronary artery without angina pectoris: Secondary | ICD-10-CM

## 2014-04-28 DIAGNOSIS — Z951 Presence of aortocoronary bypass graft: Secondary | ICD-10-CM

## 2014-04-28 DIAGNOSIS — R600 Localized edema: Secondary | ICD-10-CM

## 2014-04-28 MED ORDER — CLOPIDOGREL BISULFATE 75 MG PO TABS: ORAL_TABLET | ORAL | Status: DC

## 2014-04-28 MED ORDER — NITROGLYCERIN 0.4 MG SL SUBL: 0.4000 mg | SUBLINGUAL_TABLET | SUBLINGUAL | Status: DC | PRN

## 2014-04-28 NOTE — Progress Notes (Signed)
Patient ID: Evan [name], male   DOB: [birth year], 81 y.o.   MRN: [id number] Patient ID: Evan [name], male   DOB: 08/17/1927, 81 y.o.   MRN: [id number]    [address] [place], [place]  [address] Phone: [phone] Fax:  [phone]  Date:  04/28/2014   ID:  Evan [name], DOB [birth year], MRN [id number]  PCP:  Odette Fraction, MD      History of Present Illness: Evan [name] is a 81 y.o. male with CAD. CABG in 1998, stent in 2008. He had a normal stress test in 2012. He does not do much walking because of arthritis. He has to go up and down stairs in his house multiple times a day. No problems with this.   At last visit in 6/15, he had chest discomfort/tightness around L sternal border , lasting until sleep. Pain was gone when he woke up the next morning.  No further sx like that. He has arthritis in his left shoulder and has pain from this.  No pain related to exertion.  He has not used NTG. No Pain like what he had prior to CABG. Denies dizziness, syncope, SHOB, orthopnea, PND.  Mild LE edema. No bleeding. States that BP has been well controlled, but can't recall the actual readings.  Does not check at home. No falls. Climbs (10-11 steps) steps about 8 to 10 times daily and walks to the mailbox daily w/o [facility] or chest pain.    Dizziness while getting up from sitting position resolved.  Better since BP meds adjusted.  Denies :   Palpitations.  Paroxysmal nocturnal dyspnea.  Syncope.     Wt Readings from Last 3 Encounters:  04/28/14 182 lb 1.9 oz (82.609 kg)  01/31/14 177 lb (80.287 kg)  01/20/14 184 lb (83.462 kg)     Past Medical History  Diagnosis Date  . CAD (coronary artery disease)   . BPH (benign prostatic hyperplasia)   . Arthritis   . Asthma   . GERD (gastroesophageal reflux disease)   . Hypertension   . Anxiety   . History of ASCVD     MULTIVESSEL  . 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 Hyperlipidemia   . DJD  (degenerative joint disease)   . Hemochromatosis     Possible, elevated iron stores, hook-like osteophytes on hand films, normal LFTs    Current Outpatient Prescriptions  Medication Sig Dispense Refill  . acetaminophen (TYLENOL) 500 MG tablet Take 500 mg by mouth every 6 (six) hours as needed for mild pain.    Marland Kitchen atenolol (TENORMIN) 50 MG tablet TAKE 1 TABLET DAILY. 90 tablet 0  . clopidogrel (PLAVIX) 75 MG tablet TAKE ONE (1) TABLET BY MOUTH EVERY DAY 90 tablet 0  . CRESTOR 5 MG tablet TAKE ONE (1) TABLET BY MOUTH EVERY DAY 30 tablet 10  . diclofenac sodium (VOLTAREN) 1 % GEL Apply 2 g topically daily as needed (for pain in knees and feet).    Marland Kitchen FLUoxetine (PROZAC) 20 MG tablet Take 1 tablet (20 mg total) by mouth daily. 30 tablet 3  . folic acid (FOLVITE) 1 MG tablet Take 1 mg by mouth daily.    Marland Kitchen LORazepam (ATIVAN) 1 MG tablet TAKE ONE (1) TABLET BY MOUTH 3 TIMES DAILY. TAKE AT 8AM, 12 NOON, AND4PM. 90 tablet 0  . methotrexate (RHEUMATREX) 2.5 MG tablet Take 20 mg by mouth every Monday.     Marland Kitchen  nitroGLYCERIN (NITROSTAT) 0.4 MG SL tablet Place 0.4 mg under the tongue every 5 (five) minutes as needed for chest pain.    . pantoprazole (PROTONIX) 40 MG tablet Take 1 tablet (40 mg total) by mouth daily. 10 tablet 0  . tamsulosin (FLOMAX) 0.4 MG CAPS capsule Take 0.4 mg by mouth daily.    . traZODone (DESYREL) 50 MG tablet Take 1 tablet (50 mg total) by mouth at bedtime as needed for sleep. 30 tablet 3   No current facility-administered medications for this visit.    Allergies:    Allergies  Allergen Reactions  . Doxycycline Other (See Comments)    REACTION:  unknown  . Feldene [Piroxicam] Other (See Comments)    REACTION: Blisters  . Statins     Myalgias  . Valium Other (See Comments)    REACTION:  unknown  . Tequin Anxiety    Social History:  The patient  reports that he has never smoked. He has quit using smokeless tobacco. He reports that he does not drink alcohol or use illicit  drugs.   Family History:  The patient's family history includes Arthritis-Osteo in his sister.   ROS:  Please see the history of present illness.  No nausea, vomiting.  No fevers, chills.  No focal weakness.  No dysuria. Atypical chest pain as described above. Joint pain.   All other systems reviewed and negative.   PHYSICAL EXAM: VS:  BP 112/52 mmHg  Pulse 59  Ht 5\' 8"  (1.727 m)  Wt 182 lb 1.9 oz (82.609 kg)  BMI 27.70 kg/m2  SpO2 98% Well nourished, well developed, in no acute distress HEENT: normal Neck: no JVD, no carotid bruits Cardiac:  normal S1, S2; RRR;  Lungs:  clear to auscultation bilaterally, no wheezing, rhonchi or rales Abd: soft, nontender, no hepatomegaly Ext: tr-1+ edemaBilaterally in lower extremities Skin: warm and dry Neuro:   no focal abnormalities noted Psych: anxious affect  EKG:  Sinus bradycardia, NSST     ASSESSMENT AND PLAN:  1. Coronary atherosclerosis of native coronary artery  Refill Nitroglycerin 0.4 mg tablet, 0.4 mg, 1 tablet as directed, SL, as directed prn chest pain; Continue clopidogrel.  No bleeding issues.  Notes: He had a negative stress test in 2012. He has been under a lot of stress due to his wife's Alzheimers condition.  Wife just moved to a nursing hime.  No angina. 2. Hypercholesteremia, pure  Continue Crestor Tablet, 5 MG, 1 tab, Orally, Once a day .  LDL 39 in 8/14.  HDL 41.  Check lipids in 8/15.  Obtain most recent lipids from Dr. Samella Parr office.  3. Essential hypertension, benign  Continue Ramipril Capsule, 5 MG, 1 capsule, Orally, Once a day. Controlled.  4. Insomnia: he has trouble sleeping- wakes up early.  Explained to him that this does happen as people get older. 5. Edema: elevate legs to help with edema.  Don't think he could do compression stockings.  Given his prior issues with low blood pressure, would not want to add diuretic. He does have a Tempur-Pedic mattress that can be adjusted with remote control. Elevating  his legs will not be a problem.     Signed, Mina Marble, MD, [facility] 04/28/2014 10:30 AM

## 2014-04-28 NOTE — Patient Instructions (Signed)
Your physician recommends that you continue on your current medications as directed. Please refer to the Current Medication list given to you today.  Your physician wants you to follow-up in: 1 year with Dr. Varanasi. You will receive a reminder letter in the mail two months in advance. If you don't receive a letter, please call our office to schedule the follow-up appointment.  

## 2014-04-30 ENCOUNTER — Other Ambulatory Visit: Payer: Commercial Managed Care - HMO

## 2014-05-02 ENCOUNTER — Other Ambulatory Visit: Payer: Commercial Managed Care - HMO

## 2014-05-05 ENCOUNTER — Other Ambulatory Visit (INDEPENDENT_AMBULATORY_CARE_PROVIDER_SITE_OTHER): Payer: Commercial Managed Care - HMO | Admitting: *Deleted

## 2014-05-05 DIAGNOSIS — E785 Hyperlipidemia, unspecified: Secondary | ICD-10-CM

## 2014-05-05 DIAGNOSIS — I10 Essential (primary) hypertension: Secondary | ICD-10-CM | POA: Diagnosis not present

## 2014-05-05 LAB — LIPID PANEL
Cholesterol: 98 mg/dL (ref 0–200)
HDL: 33 mg/dL — ABNORMAL LOW (ref >39.00)
LDL Cholesterol: 48 mg/dL (ref 0–99)
NonHDL: 65
Total CHOL/HDL Ratio: 3
Triglycerides: 84 mg/dL (ref 0.0–149.0)
VLDL: 16.8 mg/dL (ref 0.0–40.0)

## 2014-05-05 LAB — COMPREHENSIVE METABOLIC PANEL
ALT: 10 U/L (ref 0–53)
AST: 16 U/L (ref 0–37)
Albumin: 4 g/dL (ref 3.5–5.2)
Alkaline Phosphatase: 79 U/L (ref 39–117)
BUN: 20 mg/dL (ref 6–23)
CO2: 27 mEq/L (ref 19–32)
Calcium: 9 mg/dL (ref 8.4–10.5)
Chloride: 107 mEq/L (ref 96–112)
Creatinine, Ser: 1.12 mg/dL (ref 0.40–1.50)
GFR: 66.28 mL/min (ref >60.00)
Glucose, Bld: 104 mg/dL — ABNORMAL HIGH (ref 70–99)
Potassium: 4.4 mEq/L (ref 3.5–5.1)
Sodium: 139 mEq/L (ref 135–145)
Total Bilirubin: 0.4 mg/dL (ref 0.2–1.2)
Total Protein: 6.3 g/dL (ref 6.0–8.3)

## 2014-05-12 ENCOUNTER — Telehealth: Payer: Self-pay | Admitting: Family Medicine

## 2014-05-12 ENCOUNTER — Other Ambulatory Visit: Payer: Self-pay | Admitting: Family Medicine

## 2014-05-12 DIAGNOSIS — F411 Generalized anxiety disorder: Secondary | ICD-10-CM

## 2014-05-12 MED ORDER — FLUOXETINE HCL 20 MG PO TABS: 20.0000 mg | ORAL_TABLET | Freq: Every day | ORAL | Status: DC

## 2014-05-12 NOTE — Telephone Encounter (Signed)
Ok to refill??  Last office visit 01/31/2014.  Last refill 04/02/2014.

## 2014-05-12 NOTE — Telephone Encounter (Signed)
Patient is calling to say that he needs his nerve medicine called in asap he is is out, he says that the pharmacy faxed over the request  [phone]

## 2014-05-12 NOTE — Telephone Encounter (Signed)
Med sent to pharm 

## 2014-05-12 NOTE — Telephone Encounter (Signed)
ok 

## 2014-05-12 NOTE — Telephone Encounter (Signed)
Medication called to pharmacy. 

## 2014-05-22 ENCOUNTER — Telehealth: Payer: Self-pay | Admitting: *Deleted

## 2014-05-22 DIAGNOSIS — Z79899 Other long term (current) drug therapy: Secondary | ICD-10-CM | POA: Diagnosis not present

## 2014-05-22 DIAGNOSIS — M353 Polymyalgia rheumatica: Secondary | ICD-10-CM | POA: Diagnosis not present

## 2014-05-22 DIAGNOSIS — M810 Age-related osteoporosis without current pathological fracture: Secondary | ICD-10-CM | POA: Diagnosis not present

## 2014-05-22 DIAGNOSIS — M2550 Pain in unspecified joint: Secondary | ICD-10-CM | POA: Diagnosis not present

## 2014-05-22 DIAGNOSIS — M159 Polyosteoarthritis, unspecified: Secondary | ICD-10-CM | POA: Diagnosis not present

## 2014-05-22 NOTE — Telephone Encounter (Signed)
Submitted [employer] referral thru [facility] for authorization to Dr. 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 Trudie Reed office is already aware of authorization

## 2014-05-29 ENCOUNTER — Other Ambulatory Visit: Payer: Self-pay | Admitting: Interventional Cardiology

## 2014-05-29 ENCOUNTER — Other Ambulatory Visit: Payer: Self-pay | Admitting: Family Medicine

## 2014-05-29 NOTE — Telephone Encounter (Signed)
Medication called to pharmacy. 

## 2014-05-29 NOTE — Telephone Encounter (Signed)
ok 

## 2014-05-29 NOTE — Telephone Encounter (Signed)
Ok to refill??  Last office visit 01/31/2014.  Last refill 05/12/2014.

## 2014-06-02 ENCOUNTER — Encounter: Payer: Self-pay | Admitting: Family Medicine

## 2014-06-02 ENCOUNTER — Ambulatory Visit (INDEPENDENT_AMBULATORY_CARE_PROVIDER_SITE_OTHER): Payer: Commercial Managed Care - HMO | Admitting: Family Medicine

## 2014-06-02 VITALS — BP 136/78 | HR 60 | Temp 97.7°F | Resp 20 | Ht 68.0 in | Wt 181.0 lb

## 2014-06-02 DIAGNOSIS — I8392 Asymptomatic varicose veins of left lower extremity: Secondary | ICD-10-CM | POA: Diagnosis not present

## 2014-06-02 NOTE — Progress Notes (Signed)
Subjective:    Patient ID: Evan [name], male    DOB: [birth year], 81 y.o.   MRN: [id number]  HPI Patient presents with swelling in his left leg. He has trace bipedal edema which is equal bilaterally. The area of swelling he is discussing is on the lateral left calf. In that area there are numerous swollen varicosities. Calf circumference in this area is 39 cm. It is approximately 38 cm on the right side. He has negative Homans sign. There is no erythema or pain. Circumference is measured 10 cm below the anterior tibial tubercle. Past Medical History  Diagnosis Date  . CAD (coronary artery disease)   . BPH (benign prostatic hyperplasia)   . Arthritis   . Asthma   . GERD (gastroesophageal reflux disease)   . Hypertension   . Anxiety   . History of ASCVD     MULTIVESSEL  . 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 Hyperlipidemia   . DJD (degenerative joint disease)   . Hemochromatosis     Possible, elevated iron stores, hook-like osteophytes on hand films, normal LFTs   Past Surgical History  Procedure Laterality Date  . Cholecystectomy    . Rotator cuff repair    . Knee surgery    . Coronary artery bypass graft    . Descending aortic aneurysm repair w/ stent      DE stent ostium into the right radial free graft at OM-, 02-2007   Current Outpatient Prescriptions on File Prior to Visit  Medication Sig Dispense Refill  . atenolol (TENORMIN) 50 MG tablet TAKE 1 TABLET DAILY. 90 tablet 0  . clopidogrel (PLAVIX) 75 MG tablet TAKE ONE (1) TABLET BY MOUTH EVERY DAY 90 tablet 3  . CRESTOR 5 MG tablet TAKE ONE (1) TABLET BY MOUTH EVERY DAY 30 tablet 10  . diclofenac sodium (VOLTAREN) 1 % GEL Apply 2 g topically daily as needed (for pain in knees and feet).    . folic acid (FOLVITE) 1 MG tablet Take 1 mg by mouth daily.    Marland Kitchen LORazepam (ATIVAN) 1 MG tablet TAKE ONE (1) TABLET BY MOUTH 3 TIMES DAILY. TAKE AT 8AM, 12 NOON, AND4PM. 90 tablet 0  . methotrexate  (RHEUMATREX) 2.5 MG tablet Take 20 mg by mouth every Monday.     . nitroGLYCERIN (NITROSTAT) 0.4 MG SL tablet Place 1 tablet (0.4 mg total) under the tongue every 5 (five) minutes as needed for chest pain. 30 tablet 3  . pantoprazole (PROTONIX) 40 MG tablet Take 1 tablet (40 mg total) by mouth daily. 10 tablet 0  . tamsulosin (FLOMAX) 0.4 MG CAPS capsule Take 0.4 mg by mouth daily.    Marland Kitchen FLUoxetine (PROZAC) 20 MG tablet Take 1 tablet (20 mg total) by mouth daily. (Patient not taking: Reported on 06/02/2014) 30 tablet 3   No current facility-administered medications on file prior to visit.   Allergies  Allergen Reactions  . Doxycycline Other (See Comments)    REACTION:  unknown  . Feldene [Piroxicam] Other (See Comments)    REACTION: Blisters  . Statins     Myalgias  . Valium Other (See Comments)    REACTION:  unknown  . Tequin Anxiety   History   Social History  . Marital Status: Married    Spouse Name: N/A  . Number of Children: N/A  . Years of Education: N/A   Occupational History  . Not on file.   Social  History Main Topics  . Smoking status: Never Smoker   . Smokeless tobacco: Former Systems developer  . Alcohol Use: No  . Drug Use: No  . Sexual Activity: Not on file   Other Topics Concern  . Not on file   Social History Narrative      Review of Systems  All other systems reviewed and are negative.      Objective:   Physical Exam  Cardiovascular: Normal rate, regular rhythm and normal heart sounds.   Pulmonary/Chest: Effort normal and breath sounds normal.  Abdominal: Soft. Bowel sounds are normal.  Musculoskeletal: He exhibits edema.  Vitals reviewed.  Large swollen varicose veins left greater than right leg        Assessment & Plan:  Varicose veins of left lower extremity  There is no evidence of a DVT. Swelling in his leg is due to varicose veins. I recommended compression stockings that are knee-high 20-30 mmHg gradient. I recommended elevating his legs. At  the present time I see no role for diuretic therapy as the patient is not fluid overloaded and given his age I would recommend against any kind of vein ablation surgery.

## 2014-06-16 ENCOUNTER — Encounter: Payer: Self-pay | Admitting: Family Medicine

## 2014-06-16 ENCOUNTER — Ambulatory Visit (INDEPENDENT_AMBULATORY_CARE_PROVIDER_SITE_OTHER): Payer: Commercial Managed Care - HMO | Admitting: Family Medicine

## 2014-06-16 VITALS — BP 126/58 | HR 62 | Temp 97.7°F | Resp 18 | Ht 68.0 in | Wt 182.0 lb

## 2014-06-16 DIAGNOSIS — R0789 Other chest pain: Secondary | ICD-10-CM | POA: Diagnosis not present

## 2014-06-16 LAB — CK TOTAL AND CKMB (NOT AT ARMC)
CK, MB: 1 ng/mL (ref 0.0–5.0)
Total CK: 67 U/L (ref 7–232)

## 2014-06-16 LAB — TROPONIN I: Troponin I: 0.01 ng/mL (ref <0.06)

## 2014-06-16 NOTE — Progress Notes (Signed)
Subjective:    Patient ID: Evan [name], male    DOB: Apr 21, 1927, 81 y.o.   MRN: [id number]  HPI Patient is an 81 year old white male with a history of coronary artery disease. His last stress test was performed in March 2012 and at that time his stress test was completely normal. He presents with 3 days of mild chest tightness. The chest tightness is 1 on a scale of 10. He denies any pain. He denies any dyspnea on exertion. He denies any exacerbation of the pain with exertion or activity. He denies any alleviating factors although he did take Rolaids last night and the pain went away. He denies any nausea or vomiting or radiation of the pain into his arm or neck. He has been burping much more frequently and reporting more gas and indigestion. On examination today his pulse oximetry is 99% on room air. With ambulation it drops to 97%. He denies any cough or pleurisy or hemoptysis. He denies any fevers or chills. The pain is not reproducible with palpation of the chest wall. Patient does report he has not been sleeping well as his wife is on hospice. He has frequent anxiety and panic attacks. He believes this may be contributing as well. He was given a dose of Maalox today at 9:00 in the morning to see if this would help ease some of the chest tightness. EKG today shows sinus bradycardia with no evidence of ischemia. There are nonspecific ST changes in leads III and aVF.  EKG actually looks much better when compared to EKG dated October 2015 which was performed in the emergency room. Past Medical History  Diagnosis Date  . CAD (coronary artery disease)   . BPH (benign prostatic hyperplasia)   . Arthritis   . Asthma   . GERD (gastroesophageal reflux disease)   . Hypertension   . Anxiety   . History of ASCVD     MULTIVESSEL  . 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 Hyperlipidemia   . DJD (degenerative joint disease)   . Hemochromatosis     Possible, elevated iron  stores, hook-like osteophytes on hand films, normal LFTs   Past Surgical History  Procedure Laterality Date  . Cholecystectomy    . Rotator cuff repair    . Knee surgery    . Coronary artery bypass graft    . Descending aortic aneurysm repair w/ stent      DE stent ostium into the right radial free graft at OM-, 02-2007   Current Outpatient Prescriptions on File Prior to Visit  Medication Sig Dispense Refill  . atenolol (TENORMIN) 50 MG tablet TAKE 1 TABLET DAILY. 90 tablet 0  . clopidogrel (PLAVIX) 75 MG tablet TAKE ONE (1) TABLET BY MOUTH EVERY DAY 90 tablet 3  . CRESTOR 5 MG tablet TAKE ONE (1) TABLET BY MOUTH EVERY DAY 30 tablet 10  . diclofenac sodium (VOLTAREN) 1 % GEL Apply 2 g topically daily as needed (for pain in knees and feet).    Marland Kitchen FLUoxetine (PROZAC) 20 MG tablet Take 1 tablet (20 mg total) by mouth daily. 30 tablet 3  . folic acid (FOLVITE) 1 MG tablet Take 1 mg by mouth daily.    Marland Kitchen LORazepam (ATIVAN) 1 MG tablet TAKE ONE (1) TABLET BY MOUTH 3 TIMES DAILY. TAKE AT 8AM, 12 NOON, AND4PM. 90 tablet 0  . methotrexate (RHEUMATREX) 2.5 MG tablet Take 20 mg by mouth every Monday.     Marland Kitchen  nitroGLYCERIN (NITROSTAT) 0.4 MG SL tablet Place 1 tablet (0.4 mg total) under the tongue every 5 (five) minutes as needed for chest pain. 30 tablet 3  . pantoprazole (PROTONIX) 40 MG tablet Take 1 tablet (40 mg total) by mouth daily. 10 tablet 0  . tamsulosin (FLOMAX) 0.4 MG CAPS capsule Take 0.4 mg by mouth daily.    . temazepam (RESTORIL) 30 MG capsule Take 30 mg by mouth at bedtime.    . traMADol (ULTRAM) 50 MG tablet Take 50 mg by mouth every 6 (six) hours as needed.     No current facility-administered medications on file prior to visit.   Allergies  Allergen Reactions  . Doxycycline Other (See Comments)    REACTION:  unknown  . Feldene [Piroxicam] Other (See Comments)    REACTION: Blisters  . Statins     Myalgias  . Valium Other (See Comments)    REACTION:  unknown  . Tequin  Anxiety   History   Social History  . Marital Status: Married    Spouse Name: N/A  . Number of Children: N/A  . Years of Education: N/A   Occupational History  . Not on file.   Social History Main Topics  . Smoking status: Never Smoker   . Smokeless tobacco: Former Systems developer  . Alcohol Use: No  . Drug Use: No  . Sexual Activity: Not on file   Other Topics Concern  . Not on file   Social History Narrative      Review of Systems  All other systems reviewed and are negative.      Objective:   Physical Exam  Constitutional: He appears well-developed and well-nourished. No distress.  Neck: Neck supple. No JVD present.  Cardiovascular: Normal rate, regular rhythm and normal heart sounds.  Exam reveals no gallop and no friction rub.   No murmur heard. Pulmonary/Chest: Effort normal and breath sounds normal. No respiratory distress. He has no wheezes. He has no rales.  Abdominal: Soft. Bowel sounds are normal. He exhibits no distension and no mass. There is no tenderness. There is no rebound and no guarding.  Musculoskeletal: He exhibits no edema.  Lymphadenopathy:    He has no cervical adenopathy.  Skin: He is not diaphoretic.  Vitals reviewed.  patient is sitting comfortably in exam room conversing normally and no distress. He is not diaphoretic. There is no increased work of breathing. He seems comfortable. He is also requesting that check his prostate because he is due for a prostate check. Patient states that he does not think he needs to go the hospital because the chest tightness is extremely mild. His exam and EKG is very reassuring        Assessment & Plan:  Chest tightness - Plan: EKG 12-Lead, CK total and CKMB (cardiac), Troponin I  EKG was obtained and was reassuring. I gave the patient a dose of Maalox because I believe the patient is likely suffering from gas and indigestion and not cardiac chest pain. I drew a set of cardiac enzymes and had them sent away  stat. I wanted the patient to stay and be monitored for an hour or so to see if his chest tightness improved on the Maalox. The patient misunderstood and left the building and went home around 9:20 while I was performing a physical exam in the adjoining room.  We contacted the patient by phone and after burping he felt much better and this discomfort had totally subsided. I will await the results  of his lab work however I suspect this is chest tightness from a GI source.  May benefit from Maalox prn.

## 2014-06-24 ENCOUNTER — Ambulatory Visit (INDEPENDENT_AMBULATORY_CARE_PROVIDER_SITE_OTHER): Payer: Commercial Managed Care - HMO | Admitting: Family Medicine

## 2014-06-24 ENCOUNTER — Encounter: Payer: Self-pay | Admitting: Family Medicine

## 2014-06-24 VITALS — BP 134/60 | HR 60 | Temp 97.5°F | Resp 18 | Ht 68.0 in | Wt 183.0 lb

## 2014-06-24 DIAGNOSIS — Z23 Encounter for immunization: Secondary | ICD-10-CM | POA: Diagnosis not present

## 2014-06-24 DIAGNOSIS — Z125 Encounter for screening for malignant neoplasm of prostate: Secondary | ICD-10-CM

## 2014-06-24 DIAGNOSIS — Z0000 Encounter for general adult medical examination without abnormal findings: Secondary | ICD-10-CM

## 2014-06-24 NOTE — Addendum Note (Signed)
Addended by: Daylene Posey T on: 06/24/2014 10:06 AM   Modules accepted: Orders

## 2014-06-24 NOTE — Progress Notes (Signed)
Subjective:    Patient ID: Evan [name], male    DOB: 23-Aug-1927, 81 y.o.   MRN: [id number]  HPI It is an 81 year old white male who is here today for complete physical exam. Please see my last office note. He has not had any further episodes of chest pain since when I last saw him. He believes the pain may have been arthritis in his shoulder. However he is completely asymptomatic and doing well. His last colonoscopy was 2005. He denies any complaints. Given his age I would recommend against routine colon cancer screening however the patient would like to perform stool cards to evaluate for blood in his stool. If there is blood in his stool he would like to go for screening colonoscopy. He is also requesting that his prostate be checked. He does have a history of an abnormal prostate exam with a firm nodular area on his right lobe of his prostate. On examination today there is still a firm area on the right lobe of the prostate. This is been evaluated by urology in the past and was felt to be prudent just to monitor this clinically given his age. His previous PSAs have all been less than 2. He would like to recheck a PSA today. His immunizations are up-to-date except for the shingles vaccine and for Prevnar 13. He would like to receive Prevnar 13 today in clinic and he would like [place] to send the shingles vaccine to his local pharmacy. Past Medical History  Diagnosis Date  . CAD (coronary artery disease)   . BPH (benign prostatic hyperplasia)   . Arthritis   . Asthma   . GERD (gastroesophageal reflux disease)   . Hypertension   . Anxiety   . History of ASCVD     MULTIVESSEL  . 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 Hyperlipidemia   . DJD (degenerative joint disease)   . Hemochromatosis     Possible, elevated iron stores, hook-like osteophytes on hand films, normal LFTs   Past Surgical History  Procedure Laterality Date  . Cholecystectomy    . Rotator cuff  repair    . Knee surgery    . Coronary artery bypass graft    . Descending aortic aneurysm repair w/ stent      DE stent ostium into the right radial free graft at OM-, 02-2007   Current Outpatient Prescriptions on File Prior to Visit  Medication Sig Dispense Refill  . atenolol (TENORMIN) 50 MG tablet TAKE 1 TABLET DAILY. 90 tablet 0  . clopidogrel (PLAVIX) 75 MG tablet TAKE ONE (1) TABLET BY MOUTH EVERY DAY 90 tablet 3  . CRESTOR 5 MG tablet TAKE ONE (1) TABLET BY MOUTH EVERY DAY 30 tablet 10  . diclofenac sodium (VOLTAREN) 1 % GEL Apply 2 g topically daily as needed (for pain in knees and feet).    Marland Kitchen FLUoxetine (PROZAC) 20 MG tablet Take 1 tablet (20 mg total) by mouth daily. 30 tablet 3  . folic acid (FOLVITE) 1 MG tablet Take 1 mg by mouth daily.    Marland Kitchen LORazepam (ATIVAN) 1 MG tablet TAKE ONE (1) TABLET BY MOUTH 3 TIMES DAILY. TAKE AT 8AM, 12 NOON, AND4PM. 90 tablet 0  . methotrexate (RHEUMATREX) 2.5 MG tablet Take 20 mg by mouth every Monday.     . nitroGLYCERIN (NITROSTAT) 0.4 MG SL tablet Place 1 tablet (0.4 mg total) under the tongue every 5 (five) minutes as needed  for chest pain. 30 tablet 3  . pantoprazole (PROTONIX) 40 MG tablet Take 1 tablet (40 mg total) by mouth daily. 10 tablet 0  . tamsulosin (FLOMAX) 0.4 MG CAPS capsule Take 0.4 mg by mouth daily.    . temazepam (RESTORIL) 30 MG capsule Take 30 mg by mouth at bedtime.    . traMADol (ULTRAM) 50 MG tablet Take 50 mg by mouth every 6 (six) hours as needed.     No current facility-administered medications on file prior to visit.   Allergies  Allergen Reactions  . Doxycycline Other (See Comments)    REACTION:  unknown  . Feldene [Piroxicam] Other (See Comments)    REACTION: Blisters  . Statins     Myalgias  . Valium Other (See Comments)    REACTION:  unknown  . Tequin Anxiety   History   Social History  . Marital Status: Married    Spouse Name: N/A  . Number of Children: N/A  . Years of Education: N/A    Occupational History  . Not on file.   Social History Main Topics  . Smoking status: Never Smoker   . Smokeless tobacco: Former Systems developer  . Alcohol Use: No  . Drug Use: No  . Sexual Activity: Not on file   Other Topics Concern  . Not on file   Social History Narrative   Family History  Problem Relation Age of Onset  . Arthritis-Osteo Sister       Review of Systems  All other systems reviewed and are negative.      Objective:   Physical Exam  Constitutional: He is oriented to person, place, and time. He appears well-developed and well-nourished. No distress.  HENT:  Head: Normocephalic and atraumatic.  Right Ear: External ear normal.  Left Ear: External ear normal.  Nose: Nose normal.  Mouth/Throat: Oropharynx is clear and moist. No oropharyngeal exudate.  Eyes: Conjunctivae and EOM are normal. Pupils are equal, round, and reactive to light. Right eye exhibits no discharge. Left eye exhibits no discharge. No scleral icterus.  Neck: Normal range of motion. Neck supple. No JVD present. No tracheal deviation present. No thyromegaly present.  Cardiovascular: Normal rate, regular rhythm, normal heart sounds and intact distal pulses.  Exam reveals no gallop and no friction rub.   No murmur heard. Pulmonary/Chest: Effort normal and breath sounds normal. No stridor. No respiratory distress. He has no wheezes. He has no rales. He exhibits no tenderness.  Abdominal: Soft. Bowel sounds are normal. He exhibits no distension and no mass. There is no tenderness. There is no rebound and no guarding.  Musculoskeletal: Normal range of motion. He exhibits no edema or tenderness.  Lymphadenopathy:    He has no cervical adenopathy.  Neurological: He is alert and oriented to person, place, and time. He has normal reflexes. He displays normal reflexes. No cranial nerve deficit. He exhibits normal muscle tone. Coordination normal.  Skin: Skin is warm. No rash noted. He is not diaphoretic. No  erythema. No pallor.  Psychiatric: He has a normal mood and affect. His behavior is normal. Judgment and thought content normal.  Vitals reviewed.         Assessment & Plan:  Prostate cancer screening - Plan: PSA, Medicare  Routine general medical examination at a health care facility - Plan: Fecal occult blood, imunochemical, Fecal occult blood, imunochemical, Fecal occult blood, imunochemical  Rectal exam is significant for a firm nodular area on the right lobe of the prostate. This is essentially  unchanged from his previous rectal exams. I will check a PSA today and as long as the PSA is less than 70 or I would not perform any further testing I would monitor this clinically. If the PSA is elevated, I would recommend a referral to an urologist for biopsy. I will send the patient home with stool cards 3. If there is blood in his stool test, I will proceed with colonoscopy however I recommended against a colonoscopy due to his age at the present time. CMP and lipid panel were obtained in February and were outstanding. Patient received Prevnar 4 today in clinic and we sent the shingles vaccine to his local pharmacy.

## 2014-06-25 LAB — PSA, MEDICARE: PSA: 1.77 ng/mL (ref <=4.00)

## 2014-06-26 ENCOUNTER — Encounter: Payer: Self-pay | Admitting: *Deleted

## 2014-06-30 ENCOUNTER — Other Ambulatory Visit: Payer: Commercial Managed Care - HMO

## 2014-06-30 ENCOUNTER — Telehealth: Payer: Self-pay | Admitting: Family Medicine

## 2014-06-30 DIAGNOSIS — Z0000 Encounter for general adult medical examination without abnormal findings: Secondary | ICD-10-CM | POA: Diagnosis not present

## 2014-06-30 NOTE — Telephone Encounter (Signed)
Patient calling to get Rx for lorazepam if possible  [phone]

## 2014-06-30 NOTE — Telephone Encounter (Signed)
?   OK to Refill  

## 2014-06-30 NOTE — Telephone Encounter (Signed)
ok 

## 2014-07-01 LAB — FECAL OCCULT BLOOD, IMMUNOCHEMICAL
Fecal Occult Blood: NEGATIVE
Fecal Occult Blood: NEGATIVE
Fecal Occult Blood: NEGATIVE

## 2014-07-01 MED ORDER — LORAZEPAM 1 MG PO TABS: ORAL_TABLET | ORAL | Status: DC

## 2014-07-01 NOTE — Telephone Encounter (Signed)
Med called to pharm 

## 2014-07-10 DIAGNOSIS — M353 Polymyalgia rheumatica: Secondary | ICD-10-CM | POA: Diagnosis not present

## 2014-07-10 DIAGNOSIS — M810 Age-related osteoporosis without current pathological fracture: Secondary | ICD-10-CM | POA: Diagnosis not present

## 2014-07-10 DIAGNOSIS — Z79899 Other long term (current) drug therapy: Secondary | ICD-10-CM | POA: Diagnosis not present

## 2014-07-10 DIAGNOSIS — M159 Polyosteoarthritis, unspecified: Secondary | ICD-10-CM | POA: Diagnosis not present

## 2014-07-10 DIAGNOSIS — M2550 Pain in unspecified joint: Secondary | ICD-10-CM | POA: Diagnosis not present

## 2014-07-13 ENCOUNTER — Emergency Department (HOSPITAL_COMMUNITY): Payer: Commercial Managed Care - HMO

## 2014-07-13 ENCOUNTER — Encounter (HOSPITAL_COMMUNITY): Payer: Self-pay

## 2014-07-13 ENCOUNTER — Emergency Department (HOSPITAL_COMMUNITY)
Admission: EM | Admit: 2014-07-13 | Discharge: 2014-07-13 | Disposition: A | Discharge status: Home / Self Care | Payer: Commercial Managed Care - HMO | Attending: Emergency Medicine | Admitting: Emergency Medicine

## 2014-07-13 DIAGNOSIS — Z79899 Other long term (current) drug therapy: Secondary | ICD-10-CM | POA: Diagnosis not present

## 2014-07-13 DIAGNOSIS — N400 Enlarged prostate without lower urinary tract symptoms: Secondary | ICD-10-CM | POA: Diagnosis not present

## 2014-07-13 DIAGNOSIS — Z7902 Long term (current) use of antithrombotics/antiplatelets: Secondary | ICD-10-CM | POA: Insufficient documentation

## 2014-07-13 DIAGNOSIS — I25119 Atherosclerotic heart disease of native coronary artery with unspecified angina pectoris: Secondary | ICD-10-CM | POA: Insufficient documentation

## 2014-07-13 DIAGNOSIS — E785 Hyperlipidemia, unspecified: Secondary | ICD-10-CM | POA: Diagnosis not present

## 2014-07-13 DIAGNOSIS — R1084 Generalized abdominal pain: Secondary | ICD-10-CM | POA: Diagnosis not present

## 2014-07-13 DIAGNOSIS — F419 Anxiety disorder, unspecified: Secondary | ICD-10-CM | POA: Insufficient documentation

## 2014-07-13 DIAGNOSIS — M1990 Unspecified osteoarthritis, unspecified site: Secondary | ICD-10-CM | POA: Diagnosis not present

## 2014-07-13 DIAGNOSIS — I10 Essential (primary) hypertension: Secondary | ICD-10-CM | POA: Diagnosis not present

## 2014-07-13 DIAGNOSIS — J45909 Unspecified asthma, uncomplicated: Secondary | ICD-10-CM | POA: Diagnosis not present

## 2014-07-13 DIAGNOSIS — Z951 Presence of aortocoronary bypass graft: Secondary | ICD-10-CM | POA: Insufficient documentation

## 2014-07-13 DIAGNOSIS — K219 Gastro-esophageal reflux disease without esophagitis: Secondary | ICD-10-CM | POA: Diagnosis not present

## 2014-07-13 DIAGNOSIS — R197 Diarrhea, unspecified: Secondary | ICD-10-CM | POA: Diagnosis present

## 2014-07-13 DIAGNOSIS — K5289 Other specified noninfective gastroenteritis and colitis: Secondary | ICD-10-CM | POA: Diagnosis not present

## 2014-07-13 DIAGNOSIS — R030 Elevated blood-pressure reading, without diagnosis of hypertension: Secondary | ICD-10-CM | POA: Diagnosis not present

## 2014-07-13 DIAGNOSIS — A047 Enterocolitis due to Clostridium difficile: Secondary | ICD-10-CM | POA: Insufficient documentation

## 2014-07-13 DIAGNOSIS — A0472 Enterocolitis due to Clostridium difficile, not specified as recurrent: Secondary | ICD-10-CM

## 2014-07-13 LAB — TYPE AND SCREEN
ABO/RH(D): B POS
Antibody Screen: NEGATIVE

## 2014-07-13 LAB — COMPREHENSIVE METABOLIC PANEL
ALT: 11 U/L (ref 0–53)
AST: 18 U/L (ref 0–37)
Albumin: 4.1 g/dL (ref 3.5–5.2)
Alkaline Phosphatase: 84 U/L (ref 39–117)
Anion gap: 10 (ref 5–15)
BUN: 16 mg/dL (ref 6–23)
CO2: 22 mmol/L (ref 19–32)
Calcium: 8.9 mg/dL (ref 8.4–10.5)
Chloride: 104 mmol/L (ref 96–112)
Creatinine, Ser: 1.01 mg/dL (ref 0.50–1.35)
GFR calc Af Amer: 77 mL/min — ABNORMAL LOW (ref >90)
GFR calc non Af Amer: 66 mL/min — ABNORMAL LOW (ref >90)
Glucose, Bld: 110 mg/dL — ABNORMAL HIGH (ref 70–99)
Potassium: 4 mmol/L (ref 3.5–5.1)
Sodium: 136 mmol/L (ref 135–145)
Total Bilirubin: 1.1 mg/dL (ref 0.3–1.2)
Total Protein: 6.9 g/dL (ref 6.0–8.3)

## 2014-07-13 LAB — CBC WITH DIFFERENTIAL/PLATELET
Basophils Absolute: 0 10*3/uL (ref 0.0–0.1)
Basophils Relative: 0 % (ref 0–1)
Eosinophils Absolute: 0 10*3/uL (ref 0.0–0.7)
Eosinophils Relative: 0 % (ref 0–5)
HCT: 41.5 % (ref 39.0–52.0)
Hemoglobin: 13.8 g/dL (ref 13.0–17.0)
Lymphocytes Relative: 15 % (ref 12–46)
Lymphs Abs: 1.7 10*3/uL (ref 0.7–4.0)
MCH: 32.9 pg (ref 26.0–34.0)
MCHC: 33.3 g/dL (ref 30.0–36.0)
MCV: 98.8 fL (ref 78.0–100.0)
Monocytes Absolute: 0.8 10*3/uL (ref 0.1–1.0)
Monocytes Relative: 7 % (ref 3–12)
Neutro Abs: 8.9 10*3/uL — ABNORMAL HIGH (ref 1.7–7.7)
Neutrophils Relative %: 78 % — ABNORMAL HIGH (ref 43–77)
Platelets: 137 10*3/uL — ABNORMAL LOW (ref 150–400)
RBC: 4.2 MIL/uL — ABNORMAL LOW (ref 4.22–5.81)
RDW: 13.6 % (ref 11.5–15.5)
WBC: 11.4 10*3/uL — ABNORMAL HIGH (ref 4.0–10.5)

## 2014-07-13 LAB — URINALYSIS, ROUTINE W REFLEX MICROSCOPIC
Bilirubin Urine: NEGATIVE
Glucose, UA: NEGATIVE mg/dL
Hgb urine dipstick: NEGATIVE
Ketones, ur: NEGATIVE mg/dL
Leukocytes, UA: NEGATIVE
Nitrite: NEGATIVE
Protein, ur: NEGATIVE mg/dL
Specific Gravity, Urine: 1.011 (ref 1.005–1.030)
Urobilinogen, UA: 0.2 mg/dL (ref 0.0–1.0)
pH: 5.5 (ref 5.0–8.0)

## 2014-07-13 LAB — I-STAT CG4 LACTIC ACID, ED: Lactic Acid, Venous: 1.37 mmol/L (ref 0.5–2.0)

## 2014-07-13 LAB — POC OCCULT BLOOD, ED: Fecal Occult Bld: POSITIVE — AB

## 2014-07-13 LAB — CLOSTRIDIUM DIFFICILE BY PCR: Toxigenic C. Difficile by PCR: POSITIVE — AB

## 2014-07-13 MED ORDER — METRONIDAZOLE 500 MG PO TABS
500.0000 mg | ORAL_TABLET | Freq: Once | ORAL | Status: AC
Start: 2014-07-13 — End: 2014-07-13
  Administered 2014-07-13: 500 mg via ORAL
  Filled 2014-07-13: qty 1

## 2014-07-13 MED ORDER — IOHEXOL 300 MG/ML  SOLN
50.0000 mL | Freq: Once | INTRAMUSCULAR | Status: AC | PRN
  Administered 2014-07-13: 50 mL via ORAL

## 2014-07-13 MED ORDER — IOHEXOL 300 MG/ML  SOLN
100.0000 mL | Freq: Once | INTRAMUSCULAR | Status: AC | PRN
  Administered 2014-07-13: 100 mL via INTRAVENOUS

## 2014-07-13 MED ORDER — METRONIDAZOLE 500 MG PO TABS: 500.0000 mg | ORAL_TABLET | Freq: Two times a day (BID) | ORAL | Status: DC

## 2014-07-13 NOTE — ED Notes (Signed)
Bed: WA04 Expected date:  Expected time:  Means of arrival:  Comments: EMS 

## 2014-07-13 NOTE — ED Notes (Addendum)
Per EMS, pt from home.  Pt states bright blood/mucous loose stool x 2 weeks.   Dull 5/10 constant pain or ache in abdomen.  No n/v.  No ortho changes.  Not initiated from change in position.  bp elevated on EMS arrival and decreased since arrival. No GI history.  Pt takes Plavix.  HX MI/Stent/HTN, arthritis  Vitals:  200/100 decreased to 124/82, hr 84, resp 18.

## 2014-07-13 NOTE — ED Provider Notes (Signed)
CSN: [id number]     Arrival date & time 07/13/14  1437 History   First MD Initiated Contact with Patient 07/13/14 1511     Chief Complaint  Patient presents with  . GI Bleeding     (Consider location/radiation/quality/duration/timing/severity/associated sxs/prior Treatment) Patient is a 81 y.o. male presenting with diarrhea. No language interpreter was used.  Diarrhea Quality:  Mucous, bloody and semi-solid Severity:  Moderate Number of episodes:  Daily for 2-3 weeks Duration:  3 weeks Timing:  Constant Progression:  Unchanged Relieved by:  Nothing Worsened by:  Nothing tried Ineffective treatments:  None tried Associated symptoms: abdominal pain and chills   Associated symptoms: no arthralgias, no recent cough, no diaphoresis, no fever, no headaches, no myalgias, no URI and no vomiting   Risk factors: no recent antibiotic use, no sick contacts, no suspicious food intake and no travel to endemic areas     Past Medical History  Diagnosis Date  . CAD (coronary artery disease)   . BPH (benign prostatic hyperplasia)   . Arthritis   . Asthma   . GERD (gastroesophageal reflux disease)   . Hypertension   . Anxiety   . History of ASCVD     MULTIVESSEL  . 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 Hyperlipidemia   . DJD (degenerative joint disease)   . Hemochromatosis     Possible, elevated iron stores, hook-like osteophytes on hand films, normal LFTs   Past Surgical History  Procedure Laterality Date  . Cholecystectomy    . Rotator cuff repair    . Knee surgery    . Coronary artery bypass graft    . Descending aortic aneurysm repair w/ stent      DE stent ostium into the right radial free graft at [facility]-, 02-2007   Family History  Problem Relation Age of Onset  . Arthritis-Osteo Sister    History  Substance Use Topics  . Smoking status: Never Smoker   . Smokeless tobacco: Former Systems developer  . Alcohol Use: No    Review of Systems   Constitutional: Positive for chills. Negative for fever, diaphoresis, activity change, appetite change and fatigue.  HENT: Negative for congestion, facial swelling, rhinorrhea and trouble swallowing.   Eyes: Negative for photophobia and pain.  Respiratory: Negative for cough, chest tightness and shortness of breath.   Cardiovascular: Negative for chest pain and leg swelling.  Gastrointestinal: Positive for abdominal pain and diarrhea. Negative for nausea, vomiting and constipation.  Endocrine: Negative for polydipsia and polyuria.  Genitourinary: Negative for dysuria, urgency, decreased urine volume and difficulty urinating.  Musculoskeletal: Negative for myalgias, back pain, arthralgias and gait problem.  Skin: Negative for color change, rash and wound.  Allergic/Immunologic: Negative for immunocompromised state.  Neurological: Negative for dizziness, facial asymmetry, speech difficulty, weakness, numbness and headaches.  Psychiatric/Behavioral: Negative for confusion, decreased concentration and agitation.      Allergies  Doxycycline; Feldene; Statins; Valium; and Tequin  Home Medications   Prior to Admission medications   Medication Sig Start Date End Date Taking? Authorizing Provider  atenolol (TENORMIN) 50 MG tablet TAKE 1 TABLET DAILY. Patient taking differently: Take 25 mg by mouth daily.  04/08/14  Yes Jettie Booze, MD  clopidogrel (PLAVIX) 75 MG tablet TAKE ONE (1) TABLET BY MOUTH EVERY DAY 04/28/14  Yes Jettie Booze, MD  CRESTOR 5 MG tablet TAKE ONE (1) TABLET BY MOUTH EVERY DAY 03/05/14  Yes Jettie Booze, MD  diclofenac  sodium (VOLTAREN) 1 % GEL Apply 2 g topically daily as needed (for pain in knees and feet).   Yes Historical Provider, MD  folic acid (FOLVITE) 1 MG tablet Take 1 mg by mouth daily.   Yes Historical Provider, MD  ibuprofen (ADVIL,MOTRIN) 200 MG tablet Take 400-600 mg by mouth every 6 (six) hours as needed for moderate pain (pain).   Yes  Historical Provider, MD  LORazepam (ATIVAN) 1 MG tablet TAKE ONE (1) TABLET BY MOUTH 3 TIMES DAILY. TAKE AT [facility] [place], [place]. 07/01/14  Yes [name], MD  nitroGLYCERIN (NITROSTAT) 0.4 MG SL tablet Place 1 tablet (0.4 mg total) under the tongue every 5 (five) minutes as needed for chest pain. 04/28/14  Yes Jettie Booze, MD  pantoprazole (PROTONIX) 40 MG tablet Take 1 tablet (40 mg total) by mouth daily. 02/05/14  Yes Jettie Booze, MD  tamsulosin (FLOMAX) 0.4 MG CAPS capsule Take 0.4 mg by mouth daily.   Yes Historical Provider, MD  temazepam (RESTORIL) 30 MG capsule Take 30 mg by mouth at bedtime.   Yes Historical Provider, MD  traMADol (ULTRAM) 50 MG tablet Take 50 mg by mouth every 6 (six) hours as needed for moderate pain or severe pain (pain).    Yes Historical Provider, MD  FLUoxetine (PROZAC) 20 MG tablet Take 1 tablet (20 mg total) by mouth daily. Patient not taking: Reported on 07/13/2014 05/12/14   [name], MD  methotrexate (RHEUMATREX) 2.5 MG tablet Take 20 mg by mouth every Monday.     Historical Provider, MD  metroNIDAZOLE (FLAGYL) 500 MG tablet Take 1 tablet (500 mg total) by mouth 2 (two) times daily. For full 2 weeks 07/13/14   Ernestina Patches, MD   BP 138/77 mmHg  Pulse 90  Temp(Src) 98 F (36.7 C) (Oral)  Resp 18  SpO2 97% Physical Exam  Constitutional: He is oriented to person, place, and time. He appears well-developed and well-nourished. No distress.  HENT:  Head: Normocephalic and atraumatic.  Mouth/Throat: No oropharyngeal exudate.  Eyes: Pupils are equal, round, and reactive to light.  Neck: Normal range of motion. Neck supple.  Cardiovascular: Normal rate, regular rhythm and normal heart sounds.  Exam reveals no gallop and no friction rub.   No murmur heard. Pulmonary/Chest: Effort normal and breath sounds normal. No respiratory distress. He has no wheezes. He has no rales.  Abdominal: Soft. Bowel sounds are normal. He exhibits no  distension and no mass. There is generalized tenderness. There is no rebound and no guarding.  Worst in RUQ and LLQ  Genitourinary: Guaiac positive stool.  Musculoskeletal: Normal range of motion. He exhibits no edema or tenderness.  Neurological: He is alert and oriented to person, place, and time.  Skin: Skin is warm and dry.  Psychiatric: He has a normal mood and affect.    ED Course  Procedures (including critical care time) Labs Review Labs Reviewed  CLOSTRIDIUM DIFFICILE BY PCR - Abnormal; Notable for the following:    C difficile by pcr POSITIVE (*)    All other components within normal limits  CBC WITH DIFFERENTIAL/PLATELET - Abnormal; Notable for the following:    WBC 11.4 (*)    RBC 4.20 (*)    Platelets 137 (*)    Neutrophils Relative % 78 (*)    Neutro Abs 8.9 (*)    All other components within normal limits  COMPREHENSIVE METABOLIC PANEL - Abnormal; Notable for the following:    Glucose, Bld 110 (*)  GFR calc non Af Amer 66 (*)    GFR calc Af Amer 77 (*)    All other components within normal limits  POC OCCULT BLOOD, ED - Abnormal; Notable for the following:    Fecal Occult Bld POSITIVE (*)    All other components within normal limits  STOOL CULTURE  URINE CULTURE  URINALYSIS, ROUTINE W REFLEX MICROSCOPIC  I-STAT CG4 LACTIC ACID, ED  TYPE AND SCREEN    Imaging Review Ct Abdomen Pelvis W Contrast  07/13/2014   CLINICAL DATA:  Bright red blood and mucus with loose stool for 2 weeks. Dull constant abdominal pain.  EXAM: CT ABDOMEN AND PELVIS WITH CONTRAST  TECHNIQUE: Multidetector CT imaging of the abdomen and pelvis was performed using the standard protocol following bolus administration of intravenous contrast.  CONTRAST:  144mL OMNIPAQUE IOHEXOL 300 MG/ML SOLN, 72mL OMNIPAQUE IOHEXOL 300 MG/ML SOLN  COMPARISON:  Reports from CT of 09/02/2011, images not available.  FINDINGS: There is a 17 mm subpleural nodular density in the right lower lobe. Minimal  diaphragmatic thickening of the right diaphragm.  Clips in the gallbladder fossa from cholecystectomy. No focal hepatic lesion. The spleen, pancreas, and adrenal glands are normal. There is symmetric renal enhancement and excretion without hydronephrosis. Vascular calcification versus nonobstructing stone in the mid left kidney.  There is diffuse colonic wall thickening from the mid descending colon through the distal sigmoid colon. Mild surrounding perienteric inflammatory change. There scattered colonic diverticula, no inflamed diverticulum to suggest diverticulitis. Small to moderate volume of stool throughout the more proximal colon. There are no dilated or thickened small bowel loops. Stomach is physiologically distended. Portions of the appendix are seen and are normal.  Abdominal aorta is normal in caliber with moderate to dense atherosclerosis. Dense atherosclerosis of the abdominal vasculature. No retroperitoneal adenopathy. No free air, free fluid, or intra-abdominal fluid collection.  Within the pelvis the urinary bladder is physiologically distended. No pelvic free fluid or adenopathy. Prostate gland is normal in size.  There are no acute or suspicious osseous abnormalities. Multilevel degenerative change throughout the spine with degenerative disc disease and facet arthropathy.  IMPRESSION: 1. Colitis spanning from the distal descending through the distal sigmoid colon, likely infectious or inflammatory in etiology. There is colonic diverticulosis, however no acute diverticulitis. 2. Nodular density in the posterior right lower lobe measuring 17 mm. This may reflect atelectasis versus pulmonary nodule. Recommend follow-up chest CT in 3 months to evaluate for imaging stability or resolution. This is not present on chest CT from 2013.   Electronically Signed   By: Jeb Levering M.D.   On: 07/13/2014 18:45     EKG Interpretation None      MDM   Final diagnoses:  Clostridium difficile colitis     Pt is a 81 y.o. male with Pmhx as above who presents with 2-3 weeks of loose blood-streaked mucousy stools with associated 1-2 weeks of a constant low abdominal pain.  This morning he developed riders which prompted ED visit.  Patient's wife of 56 years died yesterday.  Denies nausea, vomiting, dysuria, hematuria, bleeding gums or nosebleeds.  He has no recent hospitalizations has been in and out of the hospital with his wife.  He denies recent antibiotic use, travel history or suspicious food intake.  No sick contacts.  About 1 month ago he took double dose of Plavix for about 1 week, but had no visible bleeding at that time.  He denies chest pain or shortness of breath.  On  physical exam, patient has rigors, but is in no acute distress.  On abdominal exam he has positive bowel sounds with generalized tenderness which is the worst in the right upper quadrant and left lower quadrant, no rebound or guarding.  Patient has small amount of bright red blood per rectum mixed with stool on rectal exam.   Cl diff sent from stool sample and was +for cl diff.  LA nml WBC 11.4, Cr baseline, urine not infected. Pt has been tolerating PO w/o difficulty. He did have a slight orthostatic drop in BP, but denies being symptomatic. CT w/ evidence of colitis. On repeat exma, pt states he is feeling fine.  I have discussed with him admission for observation, vs home with close outpt f/u with PCP. He would like to go home, to attend wife's funeral tomorrow. Family states there will be someone will him at home. He agrees to f/u with his PCP on Tuesday, [name]. Pt meets tx criteria for 14dPO flagyl.    [name] evaluation in the Emergency Department is complete. It has been determined that no acute conditions requiring further emergency intervention are present at this time. The patient/guardian have been advised of the diagnosis and plan. We have discussed signs and symptoms that warrant return to the ED, such as  changes or worsening in symptoms, worsening pain, inability to tolerate PO or abx, fever, inc blood in stool, syncope/near syncope. He will also start probiotics at home.       Ernestina Patches, MD 07/13/14 2039

## 2014-07-13 NOTE — Discharge Instructions (Signed)
Metronidazole tablets or capsules What is this medicine? METRONIDAZOLE (me troe NI da zole) is an antiinfective. It is used to treat certain kinds of bacterial and protozoal infections. It will not work for colds, flu, or other viral infections. This medicine may be used for other purposes; ask your health care provider or pharmacist if you have questions. COMMON BRAND NAME(S): Flagyl What should I tell my health care provider before I take this medicine? They need to know if you have any of these conditions: -anemia or other blood disorders -disease of the nervous system -fungal or yeast infection -if you drink alcohol containing drinks -liver disease -seizures -an unusual or allergic reaction to metronidazole, or other medicines, foods, dyes, or preservatives -pregnant or trying to get pregnant -breast-feeding How should I use this medicine? Take this medicine by mouth with a full glass of water. Follow the directions on the prescription label. Take your medicine at regular intervals. Do not take your medicine more often than directed. Take all of your medicine as directed even if you think you are better. Do not skip doses or stop your medicine early. Talk to your pediatrician regarding the use of this medicine in children. Special care may be needed. Overdosage: If you think you have taken too much of this medicine contact a poison control center or emergency room at once. NOTE: This medicine is only for you. Do not share this medicine with others. What if I miss a dose? If you miss a dose, take it as soon as you can. If it is almost time for your next dose, take only that dose. Do not take double or extra doses. What may interact with this medicine? Do not take this medicine with any of the following medications: -alcohol or any product that contains alcohol -amprenavir oral solution -cisapride -disulfiram -dofetilide -dronedarone -paclitaxel injection -pimozide -ritonavir oral  solution -sertraline oral solution -sulfamethoxazole-trimethoprim injection -thioridazine -ziprasidone This medicine may also interact with the following medications: -birth control pills -cimetidine -lithium -other medicines that prolong the QT interval (cause an abnormal heart rhythm) -phenobarbital -phenytoin -warfarin This list may not describe all possible interactions. Give your health care provider a list of all the medicines, herbs, non-prescription drugs, or dietary supplements you use. Also tell them if you smoke, drink alcohol, or use illegal drugs. Some items may interact with your medicine. What should I watch for while using this medicine? Tell your doctor or health care professional if your symptoms do not improve or if they get worse. You may get drowsy or dizzy. Do not drive, use machinery, or do anything that needs mental alertness until you know how this medicine affects you. Do not stand or sit up quickly, especially if you are an older patient. This reduces the risk of dizzy or fainting spells. Avoid alcoholic drinks while you are taking this medicine and for three days afterward. Alcohol may make you feel dizzy, sick, or flushed. If you are being treated for a sexually transmitted disease, avoid sexual contact until you have finished your treatment. Your sexual partner may also need treatment. What side effects may I notice from receiving this medicine? Side effects that you should report to your doctor or health care professional as soon as possible: -allergic reactions like skin rash or hives, swelling of the face, lips, or tongue -confusion, clumsiness -difficulty speaking -discolored or sore mouth -dizziness -fever, infection -numbness, tingling, pain or weakness in the hands or feet -trouble passing urine or change in the amount of  urine -redness, blistering, peeling or loosening of the skin, including inside the mouth -seizures -unusually weak or  tired -vaginal irritation, dryness, or discharge Side effects that usually do not require medical attention (report to your doctor or health care professional if they continue or are bothersome): -diarrhea -headache -irritability -metallic taste -nausea -stomach pain or cramps -trouble sleeping This list may not describe all possible side effects. Call your doctor for medical advice about side effects. You may report side effects to FDA at 1-800-FDA-1088. Where should I keep my medicine? Keep out of the reach of children. Store at room temperature below 25 degrees C (77 degrees F). Protect from light. Keep container tightly closed. Throw away any unused medicine after the expiration date. NOTE: This sheet is a summary. It may not cover all possible information. If you have questions about this medicine, talk to your doctor, pharmacist, or health care provider.  2015, [employer]vier/Gold Standard. (2012-10-26 14:08:39)   Clostridium Difficile Infection Clostridium difficile (C. difficile) is a germ found in the intestines. C. difficile infection can occur after taking some medicines. C. difficile infection can cause watery poop (diarrhea) or severe disease. HOME CARE  Drink enough fluids to keep your pee (urine) clear or pale yellow. Avoid milk, caffeine, and alcohol.  Ask your doctor how to replace body fluid losses (rehydrate).  Eat small meals more often rather than large meals.  Take your medicine (antibiotics) as told. Finish it even if you start to feel better.  Do not  use medicines to slow the watery poop.  Wash your hands well after using the bathroom and before preparing food.  Make sure people who live with you wash their hands often.  Clean all surfaces. Use a product that contains chlorine bleach. GET HELP RIGHT AWAY IF:   The watery poop does not stop, or it comes back after you finish your medicine.  You feel very dry or thirsty (dehydrated).  You have a  fever.  You have more belly (abdominal) pain or tenderness.  There is blood in your poop (stool), or your poop is black and tar-like.  You cannot eat food or drink liquids without throwing up (vomiting). MAKE SURE YOU:  Understand these instructions.  Will watch your condition.  Will get help right away if you are not doing well or get worse. Document Released: 01/16/2009 Document Revised: 08/05/2013 Document Reviewed: 08/27/2010 [facility] Patient Information 2015 [place], [place]. This information is not intended to replace advice given to you by your health care provider. Make sure you discuss any questions you have with your health care provider.

## 2014-07-15 ENCOUNTER — Ambulatory Visit (INDEPENDENT_AMBULATORY_CARE_PROVIDER_SITE_OTHER): Payer: Commercial Managed Care - HMO | Admitting: Family Medicine

## 2014-07-15 ENCOUNTER — Encounter: Payer: Self-pay | Admitting: Family Medicine

## 2014-07-15 VITALS — BP 96/50 | HR 74 | Temp 97.7°F | Resp 18 | Ht 68.0 in | Wt 180.0 lb

## 2014-07-15 DIAGNOSIS — A0472 Enterocolitis due to Clostridium difficile, not specified as recurrent: Secondary | ICD-10-CM

## 2014-07-15 DIAGNOSIS — A047 Enterocolitis due to Clostridium difficile: Secondary | ICD-10-CM | POA: Diagnosis not present

## 2014-07-15 DIAGNOSIS — Z09 Encounter for follow-up examination after completed treatment for conditions other than malignant neoplasm: Secondary | ICD-10-CM | POA: Diagnosis not present

## 2014-07-15 LAB — COMPLETE METABOLIC PANEL WITHOUT GFR
ALT: 10 U/L (ref 0–53)
AST: 13 U/L (ref 0–37)
Albumin: 3.7 g/dL (ref 3.5–5.2)
Alkaline Phosphatase: 71 U/L (ref 39–117)
BUN: 27 mg/dL — ABNORMAL HIGH (ref 6–23)
CO2: 22 meq/L (ref 19–32)
Calcium: 8.5 mg/dL (ref 8.4–10.5)
Chloride: 105 meq/L (ref 96–112)
Creat: 1.27 mg/dL (ref 0.50–1.35)
GFR, Est African American: 60 mL/min
GFR, Est Non African American: 52 mL/min — ABNORMAL LOW
Glucose, Bld: 112 mg/dL — ABNORMAL HIGH (ref 70–99)
Potassium: 4 meq/L (ref 3.5–5.3)
Sodium: 138 meq/L (ref 135–145)
Total Bilirubin: 1.1 mg/dL (ref 0.2–1.2)
Total Protein: 5.8 g/dL — ABNORMAL LOW (ref 6.0–8.3)

## 2014-07-15 LAB — CBC WITH DIFFERENTIAL/PLATELET
Basophils Absolute: 0 K/uL (ref 0.0–0.1)
Basophils Relative: 0 % (ref 0–1)
Eosinophils Absolute: 0.2 K/uL (ref 0.0–0.7)
Eosinophils Relative: 2 % (ref 0–5)
HCT: 38.1 % — ABNORMAL LOW (ref 39.0–52.0)
Hemoglobin: 13.1 g/dL (ref 13.0–17.0)
Lymphocytes Relative: 7 % — ABNORMAL LOW (ref 12–46)
Lymphs Abs: 0.6 K/uL — ABNORMAL LOW (ref 0.7–4.0)
MCH: 32.2 pg (ref 26.0–34.0)
MCHC: 34.4 g/dL (ref 30.0–36.0)
MCV: 93.6 fL (ref 78.0–100.0)
MPV: 10.5 fL (ref 8.6–12.4)
Monocytes Absolute: 1.2 K/uL — ABNORMAL HIGH (ref 0.1–1.0)
Monocytes Relative: 14 % — ABNORMAL HIGH (ref 3–12)
Neutro Abs: 6.6 K/uL (ref 1.7–7.7)
Neutrophils Relative %: 77 % (ref 43–77)
Platelets: 165 K/uL (ref 150–400)
RBC: 4.07 MIL/uL — ABNORMAL LOW (ref 4.22–5.81)
RDW: 13.9 % (ref 11.5–15.5)
WBC: 8.6 K/uL (ref 4.0–10.5)

## 2014-07-15 LAB — URINE CULTURE
Colony Count: NO GROWTH
Culture: NO GROWTH
Special Requests: NORMAL

## 2014-07-15 MED ORDER — SACCHAROMYCES BOULARDII 250 MG PO CAPS: 250.0000 mg | ORAL_CAPSULE | Freq: Two times a day (BID) | ORAL | Status: DC

## 2014-07-15 MED ORDER — METRONIDAZOLE 500 MG PO TABS
500.0000 mg | ORAL_TABLET | Freq: Two times a day (BID) | ORAL | Status: DC
Start: 2014-07-15 — End: 2014-07-22

## 2014-07-15 NOTE — Progress Notes (Signed)
Subjective:    Patient ID: Evan [name], male    DOB: 02/18/1928, 81 y.o.   MRN: [id number]  HPI   Patient was seen in the emergency room over the weekend with 2-3 weeks of diarrhea. He was also having hematochezia and lower abdominal pain with fevers. His white blood cell count slightly elevated at 11. CT scan showed colitis. Stool sample obtained in the emergency room was positive for C. difficile by PCR. He was discharged from the hospital on Flagyl 500 mg by mouth twice a day for 14 days.  Unfortunately, his wife has been on hospice recently passed away due to severe dementia. Furthermore this patient is on chronic immunosuppressives including methotrexate for polymyalgia rheumatica which could possibly complicate treatment of C. difficile colitis. He is not presently taking any type of probiotic.    Since discharge from the hospital, his diarrhea has improved substantially. He is only had one bowel movement in the last 24 hours. His lower abdominal pain has improved dramatically. He denies any further hematochezia, fevers, or rigors.  However his blood pressure is slightly low today. He is on atenolol due to his history of coronary artery disease. He has been dehydrated due to the diarrhea.  Past Medical History  Diagnosis Date  . CAD (coronary artery disease)   . BPH (benign prostatic hyperplasia)   . Arthritis   . Asthma   . GERD (gastroesophageal reflux disease)   . Hypertension   . Anxiety   . History of ASCVD     MULTIVESSEL  . 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 Hyperlipidemia   . DJD (degenerative joint disease)   . Hemochromatosis     Possible, elevated iron stores, hook-like osteophytes on hand films, normal LFTs   Past Surgical History  Procedure Laterality Date  . Cholecystectomy    . Rotator cuff repair    . Knee surgery    . Coronary artery bypass graft    . Descending aortic aneurysm repair w/ stent      DE stent ostium into  the right radial free graft at OM-, 02-2007   Current Outpatient Prescriptions on File Prior to Visit  Medication Sig Dispense Refill  . atenolol (TENORMIN) 50 MG tablet TAKE 1 TABLET DAILY. (Patient taking differently: Take 25 mg by mouth daily. ) 90 tablet 0  . clopidogrel (PLAVIX) 75 MG tablet TAKE ONE (1) TABLET BY MOUTH EVERY DAY 90 tablet 3  . CRESTOR 5 MG tablet TAKE ONE (1) TABLET BY MOUTH EVERY DAY 30 tablet 10  . diclofenac sodium (VOLTAREN) 1 % GEL Apply 2 g topically daily as needed (for pain in knees and feet).    Marland Kitchen FLUoxetine (PROZAC) 20 MG tablet Take 1 tablet (20 mg total) by mouth daily. (Patient not taking: Reported on 07/13/2014) 30 tablet 3  . folic acid (FOLVITE) 1 MG tablet Take 1 mg by mouth daily.    Marland Kitchen ibuprofen (ADVIL,MOTRIN) 200 MG tablet Take 400-600 mg by mouth every 6 (six) hours as needed for moderate pain (pain).    . LORazepam (ATIVAN) 1 MG tablet TAKE ONE (1) TABLET BY MOUTH 3 TIMES DAILY. TAKE AT 8AM, 12 NOON, AND4PM. 90 tablet 0  . methotrexate (RHEUMATREX) 2.5 MG tablet Take 20 mg by mouth every Monday.     . metroNIDAZOLE (FLAGYL) 500 MG tablet Take 1 tablet (500 mg total) by mouth 2 (two) times daily. For full 2 weeks 14 tablet  0  . nitroGLYCERIN (NITROSTAT) 0.4 MG SL tablet Place 1 tablet (0.4 mg total) under the tongue every 5 (five) minutes as needed for chest pain. 30 tablet 3  . pantoprazole (PROTONIX) 40 MG tablet Take 1 tablet (40 mg total) by mouth daily. 10 tablet 0  . tamsulosin (FLOMAX) 0.4 MG CAPS capsule Take 0.4 mg by mouth daily.    . temazepam (RESTORIL) 30 MG capsule Take 30 mg by mouth at bedtime.    . traMADol (ULTRAM) 50 MG tablet Take 50 mg by mouth every 6 (six) hours as needed for moderate pain or severe pain (pain).      No current facility-administered medications on file prior to visit.   Allergies  Allergen Reactions  . Doxycycline Other (See Comments)    REACTION:  unknown  . Feldene [Piroxicam] Other (See Comments)     REACTION: Blisters  . Statins     Myalgias  . Valium Other (See Comments)    REACTION:  unknown  . Tequin Anxiety   History   Social History  . Marital Status: Married    Spouse Name: N/A  . Number of Children: N/A  . Years of Education: N/A   Occupational History  . Not on file.   Social History Main Topics  . Smoking status: Never Smoker   . Smokeless tobacco: Former Systems developer  . Alcohol Use: No  . Drug Use: No  . Sexual Activity: Not on file   Other Topics Concern  . Not on file   Social History Narrative      Review of Systems  All other systems reviewed and are negative.      Objective:   Physical Exam  Constitutional: He appears well-developed and well-nourished. No distress.  Neck: Neck supple. No JVD present.  Cardiovascular: Normal rate, regular rhythm and normal heart sounds.   No murmur heard. Pulmonary/Chest: Effort normal and breath sounds normal. No respiratory distress. He has no wheezes. He has no rales.  Abdominal: Soft. He exhibits no distension and no mass. There is tenderness. There is no rebound and no guarding.  Musculoskeletal: He exhibits no edema.  Lymphadenopathy:    He has no cervical adenopathy.  Skin: He is not diaphoretic.          Assessment & Plan:  C. difficile colitis - Plan: COMPLETE METABOLIC PANEL WITH GFR, CBC with Differential/Platelet, metroNIDAZOLE (FLAGYL) 500 MG tablet, saccharomyces boulardii (FLORASTOR) 250 MG capsule  Hospital discharge follow-up  Complete 14 days of Flagyl. I will also add florastor 250 mg pobid as a probiotic to help facilitate resolution of this infection. Recheck immediately if symptoms worsen. Otherwise I will check the patient back in one week. I want him to temporarily discontinue atenolol and try to drink more fluids particularly Gatorade. If there is any signs or symptoms of worsening diarrhea, I would repeat C. Difficile test by PCR immediately and consider starting the patient on  vancomycin given his immunocompromised status due to the methotrexate

## 2014-07-16 LAB — STOOL CULTURE: Special Requests: NORMAL

## 2014-07-17 ENCOUNTER — Encounter: Payer: Self-pay | Admitting: Family Medicine

## 2014-07-17 ENCOUNTER — Ambulatory Visit (INDEPENDENT_AMBULATORY_CARE_PROVIDER_SITE_OTHER): Payer: Commercial Managed Care - HMO | Admitting: Family Medicine

## 2014-07-17 VITALS — BP 128/78 | HR 70 | Temp 97.4°F | Resp 16 | Ht 68.0 in | Wt 183.0 lb

## 2014-07-17 DIAGNOSIS — T7840XA Allergy, unspecified, initial encounter: Secondary | ICD-10-CM | POA: Diagnosis not present

## 2014-07-17 DIAGNOSIS — A0472 Enterocolitis due to Clostridium difficile, not specified as recurrent: Secondary | ICD-10-CM

## 2014-07-17 DIAGNOSIS — A047 Enterocolitis due to Clostridium difficile: Secondary | ICD-10-CM | POA: Diagnosis not present

## 2014-07-17 MED ORDER — HYDROXYZINE HCL 25 MG PO TABS: 25.0000 mg | ORAL_TABLET | Freq: Three times a day (TID) | ORAL | Status: DC | PRN

## 2014-07-17 MED ORDER — VANCOMYCIN HCL 125 MG PO CAPS: 125.0000 mg | ORAL_CAPSULE | Freq: Four times a day (QID) | ORAL | Status: DC

## 2014-07-17 MED ORDER — METHYLPREDNISOLONE ACETATE 80 MG/ML IJ SUSP
40.0000 mg | Freq: Once | INTRAMUSCULAR | Status: AC
  Administered 2014-07-17: 40 mg via INTRAMUSCULAR

## 2014-07-17 NOTE — Telephone Encounter (Signed)
This encounter was created in error - please disregard.

## 2014-07-17 NOTE — Progress Notes (Signed)
Subjective:    Patient ID: Evan [name], male    DOB: [birth year], 82 y.o.   MRN: [id number]  HPI   07/15/14 Patient was seen in the emergency room over the weekend with 2-3 weeks of diarrhea. He was also having hematochezia and lower abdominal pain with fevers. His white blood cell count slightly elevated at 11. CT scan showed colitis. Stool sample obtained in the emergency room was positive for C. difficile by PCR. He was discharged from the hospital on Flagyl 500 mg by mouth twice a day for 14 days.  Unfortunately, his wife has been on hospice recently passed away due to severe dementia. Furthermore this patient is on chronic immunosuppressives including methotrexate for polymyalgia rheumatica which could possibly complicate treatment of C. difficile colitis. He is not presently taking any type of probiotic.    Since discharge from the hospital, his diarrhea has improved substantially. He is only had one bowel movement in the last 24 hours. His lower abdominal pain has improved dramatically. He denies any further hematochezia, fevers, or rigors.  However his blood pressure is slightly low today. He is on atenolol due to his history of coronary artery disease. He has been dehydrated due to the diarrhea.  At that time, my plan was:  Complete 14 days of Flagyl. I will also add florastor 250 mg pobid as a probiotic to help facilitate resolution of this infection. Recheck immediately if symptoms worsen. Otherwise I will check the patient back in one week. I want him to temporarily discontinue atenolol and try to drink more fluids particularly Gatorade. If there is any signs or symptoms of worsening diarrhea, I would repeat C. Difficile test by PCR immediately and consider starting the patient on vancomycin given his immunocompromised status due to the methotrexate  07/17/14 Patient states that ever since he was in the hospital he has been itching severely. He is now itching uncontrollably on his back.  Examination of his back reveals a diffuse erythematous macular rash on his back which is extending onto his chest. He states that he is allergic to the sheets in the hospital however he is also been placed on Flagyl since that time. He also states that he feels an itching sensation in his throat although he denies any swelling in his lips or times. He denies any stridor or shortness of breath.  Past Medical History  Diagnosis Date  . CAD (coronary artery disease)   . BPH (benign prostatic hyperplasia)   . Arthritis   . Asthma   . GERD (gastroesophageal reflux disease)   . Hypertension   . Anxiety   . History of ASCVD     MULTIVESSEL  . 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 Hyperlipidemia   . DJD (degenerative joint disease)   . Hemochromatosis     Possible, elevated iron stores, hook-like osteophytes on hand films, normal LFTs   Past Surgical History  Procedure Laterality Date  . Cholecystectomy    . Rotator cuff repair    . Knee surgery    . Coronary artery bypass graft    . Descending aortic aneurysm repair w/ stent      DE stent ostium into the right radial free graft at OM-, 02-2007   Current Outpatient Prescriptions on File Prior to Visit  Medication Sig Dispense Refill  . atenolol (TENORMIN) 50 MG tablet TAKE 1 TABLET DAILY. (Patient taking differently: Take 25 mg by mouth daily. )  90 tablet 0  . clopidogrel (PLAVIX) 75 MG tablet TAKE ONE (1) TABLET BY MOUTH EVERY DAY 90 tablet 3  . CRESTOR 5 MG tablet TAKE ONE (1) TABLET BY MOUTH EVERY DAY 30 tablet 10  . diclofenac sodium (VOLTAREN) 1 % GEL Apply 2 g topically daily as needed (for pain in knees and feet).    Marland Kitchen FLUoxetine (PROZAC) 20 MG tablet Take 1 tablet (20 mg total) by mouth daily. 30 tablet 3  . folic acid (FOLVITE) 1 MG tablet Take 1 mg by mouth daily.    Marland Kitchen ibuprofen (ADVIL,MOTRIN) 200 MG tablet Take 400-600 mg by mouth every 6 (six) hours as needed for moderate pain (pain).    .  LORazepam (ATIVAN) 1 MG tablet TAKE ONE (1) TABLET BY MOUTH 3 TIMES DAILY. TAKE AT 8AM, 12 NOON, AND4PM. 90 tablet 0  . methotrexate (RHEUMATREX) 2.5 MG tablet Take 20 mg by mouth every Monday.     . metroNIDAZOLE (FLAGYL) 500 MG tablet Take 1 tablet (500 mg total) by mouth 2 (two) times daily. For full 2 weeks 14 tablet 0  . metroNIDAZOLE (FLAGYL) 500 MG tablet Take 1 tablet (500 mg total) by mouth 2 (two) times daily. 21 tablet 0  . nitroGLYCERIN (NITROSTAT) 0.4 MG SL tablet Place 1 tablet (0.4 mg total) under the tongue every 5 (five) minutes as needed for chest pain. 30 tablet 3  . pantoprazole (PROTONIX) 40 MG tablet Take 1 tablet (40 mg total) by mouth daily. 10 tablet 0  . saccharomyces boulardii (FLORASTOR) 250 MG capsule Take 1 capsule (250 mg total) by mouth 2 (two) times daily. 60 capsule 0  . tamsulosin (FLOMAX) 0.4 MG CAPS capsule Take 0.4 mg by mouth daily.    . temazepam (RESTORIL) 30 MG capsule Take 30 mg by mouth at bedtime.    . traMADol (ULTRAM) 50 MG tablet Take 50 mg by mouth every 6 (six) hours as needed for moderate pain or severe pain (pain).      No current facility-administered medications on file prior to visit.   Allergies  Allergen Reactions  . Doxycycline Other (See Comments)    REACTION:  unknown  . Feldene [Piroxicam] Other (See Comments)    REACTION: Blisters  . Statins     Myalgias  . Valium Other (See Comments)    REACTION:  unknown  . Tequin Anxiety   History   Social History  . Marital Status: Married    Spouse Name: N/A  . Number of Children: N/A  . Years of Education: N/A   Occupational History  . Not on file.   Social History Main Topics  . Smoking status: Never Smoker   . Smokeless tobacco: Former Systems developer  . Alcohol Use: No  . Drug Use: No  . Sexual Activity: Not on file   Other Topics Concern  . Not on file   Social History Narrative      Review of Systems  All other systems reviewed and are negative.      Objective:    Physical Exam  Constitutional: He appears well-developed and well-nourished. No distress.  Neck: Neck supple. No JVD present.  Cardiovascular: Normal rate, regular rhythm and normal heart sounds.   No murmur heard. Pulmonary/Chest: Effort normal and breath sounds normal. No stridor. No respiratory distress. He has no wheezes. He has no rales.  Abdominal: Soft. He exhibits no distension and no mass. There is no tenderness. There is no rebound and no guarding.  Musculoskeletal: He exhibits  no edema.  Lymphadenopathy:    He has no cervical adenopathy.  Skin: Rash noted. He is not diaphoretic. There is erythema.          Assessment & Plan:  C. difficile colitis - Plan: vancomycin (VANCOCIN) 125 MG capsule, hydrOXYzine (ATARAX/VISTARIL) 25 MG tablet  Allergic reaction, initial encounter - Plan: methylPREDNISolone acetate (DEPO-MEDROL) injection 40 mg  Patient appears to be having allergic reaction I am unsure if this is due to the metronidazole or if it could be due, as the patient feels, to the sheets in the hospital. To be cautious, I will discontinue Flagyl and replaced with vancomycin 125 mg by mouth 4 times a day for 2 weeks. Patient can use hydroxyzine 25 mg every 8 hours as needed for itching. I will also give the patient Depo-Medrol 40 mg IM 1 now. Recheck in 24 hours if no better or sooner if worse.

## 2014-07-17 NOTE — Telephone Encounter (Signed)
Opened in error

## 2014-07-22 ENCOUNTER — Ambulatory Visit (INDEPENDENT_AMBULATORY_CARE_PROVIDER_SITE_OTHER): Payer: Commercial Managed Care - HMO | Admitting: Family Medicine

## 2014-07-22 ENCOUNTER — Encounter: Payer: Self-pay | Admitting: Family Medicine

## 2014-07-22 VITALS — BP 122/60 | HR 84 | Temp 97.8°F | Resp 16 | Ht 68.0 in | Wt 181.0 lb

## 2014-07-22 DIAGNOSIS — A0472 Enterocolitis due to Clostridium difficile, not specified as recurrent: Secondary | ICD-10-CM

## 2014-07-22 DIAGNOSIS — A047 Enterocolitis due to Clostridium difficile: Secondary | ICD-10-CM | POA: Diagnosis not present

## 2014-07-22 MED ORDER — SACCHAROMYCES BOULARDII 250 MG PO CAPS: 250.0000 mg | ORAL_CAPSULE | Freq: Two times a day (BID) | ORAL | Status: DC

## 2014-07-22 NOTE — Progress Notes (Signed)
Subjective:    Patient ID: Evan [name], male    DOB: 1927-09-02, 81 y.o.   MRN: [id number]  HPI   07/15/14 Patient was seen in the emergency room over the weekend with 2-3 weeks of diarrhea. He was also having hematochezia and lower abdominal pain with fevers. His white blood cell count slightly elevated at 11. CT scan showed colitis. Stool sample obtained in the emergency room was positive for C. difficile by PCR. He was discharged from the hospital on Flagyl 500 mg by mouth twice a day for 14 days.  Unfortunately, his wife has been on hospice recently passed away due to severe dementia. Furthermore this patient is on chronic immunosuppressives including methotrexate for polymyalgia rheumatica which could possibly complicate treatment of C. difficile colitis. He is not presently taking any type of probiotic.    Since discharge from the hospital, his diarrhea has improved substantially. He is only had one bowel movement in the last 24 hours. His lower abdominal pain has improved dramatically. He denies any further hematochezia, fevers, or rigors.  However his blood pressure is slightly low today. He is on atenolol due to his history of coronary artery disease. He has been dehydrated due to the diarrhea.  At that time, my plan was:  Complete 14 days of Flagyl. I will also add florastor 250 mg pobid as a probiotic to help facilitate resolution of this infection. Recheck immediately if symptoms worsen. Otherwise I will check the patient back in one week. I want him to temporarily discontinue atenolol and try to drink more fluids particularly Gatorade. If there is any signs or symptoms of worsening diarrhea, I would repeat C. Difficile test by PCR immediately and consider starting the patient on vancomycin given his immunocompromised status due to the methotrexate  07/17/14 Patient states that ever since he was in the hospital he has been itching severely. He is now itching uncontrollably on his back.  Examination of his back reveals a diffuse erythematous macular rash on his back which is extending onto his chest. He states that he is allergic to the sheets in the hospital however he is also been placed on Flagyl since that time. He also states that he feels an itching sensation in his throat although he denies any swelling in his lips or times. He denies any stridor or shortness of breath.  At that time, my plan was: Patient appears to be having allergic reaction I am unsure if this is due to the metronidazole or if it could be due, as the patient feels, to the sheets in the hospital. To be cautious, I will discontinue Flagyl and replaced with vancomycin 125 mg by mouth 4 times a day for 2 weeks. Patient can use hydroxyzine 25 mg every 8 hours as needed for itching. I will also give the patient Depo-Medrol 40 mg IM 1 now. Recheck in 24 hours if no better or sooner if worse.  07/22/14 Patient is here today for follow-up. His diarrhea has completely resolved. He denies any melena or hematochezia. He denies any itching or rash. He denies any fevers or chills or abdominal pain. Overall he is back to his baseline. He is doing well. He is currently on his second week of vancomycin.  Past Medical History  Diagnosis Date  . CAD (coronary artery disease)   . BPH (benign prostatic hyperplasia)   . Arthritis   . Asthma   . GERD (gastroesophageal reflux disease)   . Hypertension   . Anxiety   .  History of ASCVD     MULTIVESSEL  . 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 Hyperlipidemia   . DJD (degenerative joint disease)   . Hemochromatosis     Possible, elevated iron stores, hook-like osteophytes on hand films, normal LFTs   Past Surgical History  Procedure Laterality Date  . Cholecystectomy    . Rotator cuff repair    . Knee surgery    . Coronary artery bypass graft    . Descending aortic aneurysm repair w/ stent      DE stent ostium into the right radial free  graft at OM-, 02-2007   Current Outpatient Prescriptions on File Prior to Visit  Medication Sig Dispense Refill  . atenolol (TENORMIN) 50 MG tablet TAKE 1 TABLET DAILY. (Patient taking differently: Take 25 mg by mouth daily. ) 90 tablet 0  . clopidogrel (PLAVIX) 75 MG tablet TAKE ONE (1) TABLET BY MOUTH EVERY DAY 90 tablet 3  . CRESTOR 5 MG tablet TAKE ONE (1) TABLET BY MOUTH EVERY DAY 30 tablet 10  . diclofenac sodium (VOLTAREN) 1 % GEL Apply 2 g topically daily as needed (for pain in knees and feet).    Marland Kitchen FLUoxetine (PROZAC) 20 MG tablet Take 1 tablet (20 mg total) by mouth daily. 30 tablet 3  . folic acid (FOLVITE) 1 MG tablet Take 1 mg by mouth daily.    . hydrOXYzine (ATARAX/VISTARIL) 25 MG tablet Take 1 tablet (25 mg total) by mouth 3 (three) times daily as needed for itching. 30 tablet 0  . ibuprofen (ADVIL,MOTRIN) 200 MG tablet Take 400-600 mg by mouth every 6 (six) hours as needed for moderate pain (pain).    . LORazepam (ATIVAN) 1 MG tablet TAKE ONE (1) TABLET BY MOUTH 3 TIMES DAILY. TAKE AT 8AM, 12 NOON, AND4PM. 90 tablet 0  . methotrexate (RHEUMATREX) 2.5 MG tablet Take 20 mg by mouth every Monday.     . metroNIDAZOLE (FLAGYL) 500 MG tablet Take 1 tablet (500 mg total) by mouth 2 (two) times daily. For full 2 weeks 14 tablet 0  . nitroGLYCERIN (NITROSTAT) 0.4 MG SL tablet Place 1 tablet (0.4 mg total) under the tongue every 5 (five) minutes as needed for chest pain. 30 tablet 3  . pantoprazole (PROTONIX) 40 MG tablet Take 1 tablet (40 mg total) by mouth daily. 10 tablet 0  . tamsulosin (FLOMAX) 0.4 MG CAPS capsule Take 0.4 mg by mouth daily.    . temazepam (RESTORIL) 30 MG capsule Take 30 mg by mouth at bedtime.    . traMADol (ULTRAM) 50 MG tablet Take 50 mg by mouth every 6 (six) hours as needed for moderate pain or severe pain (pain).     . vancomycin (VANCOCIN) 125 MG capsule Take 1 capsule (125 mg total) by mouth 4 (four) times daily. This replaces metronidazole 56 capsule 0    No current facility-administered medications on file prior to visit.   Allergies  Allergen Reactions  . Doxycycline Other (See Comments)    REACTION:  unknown  . Feldene [Piroxicam] Other (See Comments)    REACTION: Blisters  . Statins     Myalgias  . Valium Other (See Comments)    REACTION:  unknown  . Tequin Anxiety   History   Social History  . Marital Status: Married    Spouse Name: N/A  . Number of Children: N/A  . Years of Education: N/A   Occupational History  . Not  on file.   Social History Main Topics  . Smoking status: Never Smoker   . Smokeless tobacco: Former Systems developer  . Alcohol Use: No  . Drug Use: No  . Sexual Activity: Not on file   Other Topics Concern  . Not on file   Social History Narrative      Review of Systems  All other systems reviewed and are negative.      Objective:   Physical Exam  Constitutional: He appears well-developed and well-nourished. No distress.  Neck: Neck supple. No JVD present.  Cardiovascular: Normal rate, regular rhythm and normal heart sounds.   No murmur heard. Pulmonary/Chest: Effort normal and breath sounds normal. No stridor. No respiratory distress. He has no wheezes. He has no rales.  Abdominal: Soft. He exhibits no distension and no mass. There is no tenderness. There is no rebound and no guarding.  Musculoskeletal: He exhibits no edema.  Lymphadenopathy:    He has no cervical adenopathy.  Skin: No rash noted. He is not diaphoretic. No erythema.          Assessment & Plan:  C. difficile colitis - Plan: saccharomyces boulardii (FLORASTOR) 250 MG capsule  Clinically the patient has improved and has completely resolved. I would like him to finish the vancomycin. I would like him to continue florastor for an additional month.  He is to recheck immediately if any diarrhea returns. Patient understands. No specific follow-up is needed for this issue if he continues to do well off antibiotics.

## 2014-08-01 ENCOUNTER — Other Ambulatory Visit: Payer: Self-pay | Admitting: Family Medicine

## 2014-08-01 NOTE — Telephone Encounter (Signed)
LRF 07/01/14 #[id number]  LOV 07/22/14  OK refill?

## 2014-08-01 NOTE — Telephone Encounter (Signed)
rx called in

## 2014-08-01 NOTE — Telephone Encounter (Signed)
ok 

## 2014-08-08 ENCOUNTER — Ambulatory Visit: Payer: Commercial Managed Care - HMO | Admitting: Family Medicine

## 2014-08-08 VITALS — BP 154/72

## 2014-08-08 DIAGNOSIS — Z0130 Encounter for examination of blood pressure without abnormal findings: Secondary | ICD-10-CM

## 2014-08-11 ENCOUNTER — Ambulatory Visit: Payer: Commercial Managed Care - HMO | Admitting: Family Medicine

## 2014-08-11 VITALS — BP 120/60

## 2014-08-11 DIAGNOSIS — I10 Essential (primary) hypertension: Secondary | ICD-10-CM

## 2014-08-13 ENCOUNTER — Ambulatory Visit: Payer: Commercial Managed Care - HMO | Admitting: Family Medicine

## 2014-08-13 VITALS — BP 130/80

## 2014-08-13 DIAGNOSIS — I10 Essential (primary) hypertension: Secondary | ICD-10-CM

## 2014-08-15 ENCOUNTER — Ambulatory Visit: Payer: Commercial Managed Care - HMO | Admitting: Family Medicine

## 2014-08-15 VITALS — BP 140/60

## 2014-08-15 DIAGNOSIS — Z0130 Encounter for examination of blood pressure without abnormal findings: Secondary | ICD-10-CM

## 2014-08-18 ENCOUNTER — Ambulatory Visit: Payer: Commercial Managed Care - HMO | Admitting: Family Medicine

## 2014-08-18 ENCOUNTER — Ambulatory Visit (INDEPENDENT_AMBULATORY_CARE_PROVIDER_SITE_OTHER): Payer: Commercial Managed Care - HMO | Admitting: Family Medicine

## 2014-08-18 ENCOUNTER — Encounter: Payer: Self-pay | Admitting: Family Medicine

## 2014-08-18 VITALS — BP 120/60 | HR 78 | Temp 96.9°F | Resp 18 | Ht 68.0 in | Wt 179.0 lb

## 2014-08-18 DIAGNOSIS — I2510 Atherosclerotic heart disease of native coronary artery without angina pectoris: Secondary | ICD-10-CM

## 2014-08-18 NOTE — Progress Notes (Signed)
Subjective:    Patient ID: Evan [name], male    DOB: June 21, 1927, 81 y.o.   MRN: [id number]  HPI Please see the recent office visits. Patient has C. difficile colitis. At that time we temporarily discontinued his atenolol due to low blood pressure. Pain for the patient has recovered from that infection. Last week his blood pressures typically running 120-140 over 60s. Today he is 120/60. He has a past medical history of ASCVD status post CABG. He would benefit from resuming a beta blocker if he can tolerate it with his blood pressure  Past Medical History  Diagnosis Date  . CAD (coronary artery disease)   . BPH (benign prostatic hyperplasia)   . Arthritis   . Asthma   . GERD (gastroesophageal reflux disease)   . Hypertension   . Anxiety   . History of ASCVD     MULTIVESSEL  . 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 Hyperlipidemia   . DJD (degenerative joint disease)   . Hemochromatosis     Possible, elevated iron stores, hook-like osteophytes on hand films, normal LFTs   Past Surgical History  Procedure Laterality Date  . Cholecystectomy    . Rotator cuff repair    . Knee surgery    . Coronary artery bypass graft    . Descending aortic aneurysm repair w/ stent      DE stent ostium into the right radial free graft at OM-, 02-2007   Current Outpatient Prescriptions on File Prior to Visit  Medication Sig Dispense Refill  . clopidogrel (PLAVIX) 75 MG tablet TAKE ONE (1) TABLET BY MOUTH EVERY DAY 90 tablet 3  . CRESTOR 5 MG tablet TAKE ONE (1) TABLET BY MOUTH EVERY DAY 30 tablet 10  . diclofenac sodium (VOLTAREN) 1 % GEL Apply 2 g topically daily as needed (for pain in knees and feet).    Marland Kitchen FLUoxetine (PROZAC) 20 MG tablet Take 1 tablet (20 mg total) by mouth daily. 30 tablet 3  . folic acid (FOLVITE) 1 MG tablet Take 1 mg by mouth daily.    . hydrOXYzine (ATARAX/VISTARIL) 25 MG tablet Take 1 tablet (25 mg total) by mouth 3 (three) times daily as  needed for itching. 30 tablet 0  . ibuprofen (ADVIL,MOTRIN) 200 MG tablet Take 400-600 mg by mouth every 6 (six) hours as needed for moderate pain (pain).    . LORazepam (ATIVAN) 1 MG tablet TAKE ONE (1) TABLET BY MOUTH 3 TIMES DAILY. TAKE AT 8AM, 12 NOON, AND4PM. 90 tablet 0  . methotrexate (RHEUMATREX) 2.5 MG tablet Take 20 mg by mouth every Monday.     . nitroGLYCERIN (NITROSTAT) 0.4 MG SL tablet Place 1 tablet (0.4 mg total) under the tongue every 5 (five) minutes as needed for chest pain. 30 tablet 3  . pantoprazole (PROTONIX) 40 MG tablet Take 1 tablet (40 mg total) by mouth daily. 10 tablet 0  . saccharomyces boulardii (FLORASTOR) 250 MG capsule Take 1 capsule (250 mg total) by mouth 2 (two) times daily. 60 capsule 0  . tamsulosin (FLOMAX) 0.4 MG CAPS capsule Take 0.4 mg by mouth daily.    . temazepam (RESTORIL) 30 MG capsule Take 30 mg by mouth at bedtime.    . traMADol (ULTRAM) 50 MG tablet Take 50 mg by mouth every 6 (six) hours as needed for moderate pain or severe pain (pain).     Marland Kitchen atenolol (TENORMIN) 50 MG tablet TAKE 1 TABLET  DAILY. (Patient not taking: Reported on 08/08/2014) 90 tablet 0   No current facility-administered medications on file prior to visit.   Allergies  Allergen Reactions  . Doxycycline Other (See Comments)    REACTION:  unknown  . Feldene [Piroxicam] Other (See Comments)    REACTION: Blisters  . Statins     Myalgias  . Valium Other (See Comments)    REACTION:  unknown  . Tequin Anxiety   History   Social History  . Marital Status: Married    Spouse Name: N/A  . Number of Children: N/A  . Years of Education: N/A   Occupational History  . Not on file.   Social History Main Topics  . Smoking status: Never Smoker   . Smokeless tobacco: Former Systems developer  . Alcohol Use: No  . Drug Use: No  . Sexual Activity: Not on file   Other Topics Concern  . Not on file   Social History Narrative      Review of Systems  All other systems reviewed and are  negative.     Objective:   Physical Exam  Cardiovascular: Normal rate, regular rhythm and normal heart sounds.   No murmur heard. Pulmonary/Chest: Effort normal and breath sounds normal. No respiratory distress. He has no wheezes. He has no rales. He exhibits no tenderness.  Musculoskeletal: He exhibits no edema.  Vitals reviewed.         Assessment & Plan:  ASCVD (arteriosclerotic cardiovascular disease)  Resume atenolol 50 mg tablets, one half tablet every day.

## 2014-08-20 DIAGNOSIS — Z79899 Other long term (current) drug therapy: Secondary | ICD-10-CM | POA: Diagnosis not present

## 2014-08-20 DIAGNOSIS — M353 Polymyalgia rheumatica: Secondary | ICD-10-CM | POA: Diagnosis not present

## 2014-08-20 DIAGNOSIS — M2550 Pain in unspecified joint: Secondary | ICD-10-CM | POA: Diagnosis not present

## 2014-08-20 DIAGNOSIS — M810 Age-related osteoporosis without current pathological fracture: Secondary | ICD-10-CM | POA: Diagnosis not present

## 2014-08-20 DIAGNOSIS — M159 Polyosteoarthritis, unspecified: Secondary | ICD-10-CM | POA: Diagnosis not present

## 2014-08-26 ENCOUNTER — Other Ambulatory Visit: Payer: Self-pay | Admitting: Family Medicine

## 2014-08-26 NOTE — Telephone Encounter (Signed)
ok 

## 2014-08-26 NOTE — Telephone Encounter (Signed)
?   OK to Refill  

## 2014-08-26 NOTE — Telephone Encounter (Signed)
rx called in

## 2014-09-03 ENCOUNTER — Encounter: Payer: Self-pay | Admitting: Physician Assistant

## 2014-09-03 ENCOUNTER — Ambulatory Visit (INDEPENDENT_AMBULATORY_CARE_PROVIDER_SITE_OTHER): Payer: Commercial Managed Care - HMO | Admitting: Physician Assistant

## 2014-09-03 VITALS — BP 120/60 | HR 58 | Temp 97.8°F | Resp 19 | Wt 177.0 lb

## 2014-09-03 DIAGNOSIS — F411 Generalized anxiety disorder: Secondary | ICD-10-CM

## 2014-09-03 DIAGNOSIS — G4700 Insomnia, unspecified: Secondary | ICD-10-CM

## 2014-09-03 MED ORDER — TRAZODONE HCL 50 MG PO TABS: 50.0000 mg | ORAL_TABLET | Freq: Every evening | ORAL | Status: DC | PRN

## 2014-09-03 MED ORDER — TEMAZEPAM 30 MG PO CAPS: 30.0000 mg | ORAL_CAPSULE | Freq: Every evening | ORAL | Status: DC | PRN

## 2014-09-03 NOTE — Progress Notes (Signed)
Patient ID: Evan [name] MRN: [id number], DOB: [birth year], 81 y.o. Date of Encounter: 09/03/2014, 12:07 PM    Chief Complaint:  Chief Complaint  Patient presents with  . didn't sleep last night     HPI: 81 y.o. year old male ports that he has been feeling weak and nervous and has not been sleeping good. Says that he frequently wakes up at like 1 AM and again at 2 AM, 3 AM etc. Is that after several nights of not sleeping well like that he just feels weak and tired. Says that his wife passed away several months ago and he thinks the symptoms have been worse recently secondary to that. He has all of his medication bottles with him. Reviewed that he is taking his lorazepam 1 mg 3 times a day at 8 AM 12 noon and 4 PM. Is taking temazepam 30 mg daily at bedtime.  I reviewed Dr. Samella Parr office notes on 01/20/14 and also 01/31/14. I reviewed these to specifically as the diagnoses of those visits were insomnia and anxiety. At the visit 01/20/14 he prescribed trazodone 50 mg. The visit 01/31/14 that note stated that he has stopped the trazodone but did not say why. However I did note that in those nodes patient has stopped other medications for no good reason. At that visit 01/31/2014 Prozac 20 mg was started. Pt states that he is still taking the Prozac 20 mg daily.  Asked him if he can recall why he stopped the trazodone that was prescribed 01/20/14. He has no idea.     Home Meds:   Outpatient Prescriptions Prior to Visit  Medication Sig Dispense Refill  . atenolol (TENORMIN) 50 MG tablet TAKE 1 TABLET DAILY. 90 tablet 0  . clopidogrel (PLAVIX) 75 MG tablet TAKE ONE (1) TABLET BY MOUTH EVERY DAY 90 tablet 3  . CRESTOR 5 MG tablet TAKE ONE (1) TABLET BY MOUTH EVERY DAY 30 tablet 10  . diclofenac sodium (VOLTAREN) 1 % GEL Apply 2 g topically daily as needed (for pain in knees and feet).    Marland Kitchen FLUoxetine (PROZAC) 20 MG tablet Take 1 tablet (20 mg total) by mouth daily. 30 tablet 3  . folic  acid (FOLVITE) 1 MG tablet Take 1 mg by mouth daily.    Marland Kitchen ibuprofen (ADVIL,MOTRIN) 200 MG tablet Take 400-600 mg by mouth every 6 (six) hours as needed for moderate pain (pain).    . LORazepam (ATIVAN) 1 MG tablet TAKE ONE (1) TABLET BY MOUTH 3 TIMES DAILY. TAKE AT 8AM, 12 NOON, AND4PM. 90 tablet 0  . methotrexate (RHEUMATREX) 2.5 MG tablet Take 20 mg by mouth every Monday.     . pantoprazole (PROTONIX) 40 MG tablet Take 1 tablet (40 mg total) by mouth daily. 10 tablet 0  . tamsulosin (FLOMAX) 0.4 MG CAPS capsule Take 0.4 mg by mouth daily.    . temazepam (RESTORIL) 30 MG capsule Take 30 mg by mouth at bedtime.    . hydrOXYzine (ATARAX/VISTARIL) 25 MG tablet Take 1 tablet (25 mg total) by mouth 3 (three) times daily as needed for itching. (Patient not taking: Reported on 09/03/2014) 30 tablet 0  . nitroGLYCERIN (NITROSTAT) 0.4 MG SL tablet Place 1 tablet (0.4 mg total) under the tongue every 5 (five) minutes as needed for chest pain. (Patient not taking: Reported on 09/03/2014) 30 tablet 3  . saccharomyces boulardii (FLORASTOR) 250 MG capsule Take 1 capsule (250 mg total) by mouth 2 (two) times daily. (Patient not taking:  Reported on 09/03/2014) 60 capsule 0  . traMADol (ULTRAM) 50 MG tablet Take 50 mg by mouth every 6 (six) hours as needed for moderate pain or severe pain (pain).      No facility-administered medications prior to visit.    Allergies:  Allergies  Allergen Reactions  . Doxycycline Other (See Comments)    REACTION:  unknown  . Feldene [Piroxicam] Other (See Comments)    REACTION: Blisters  . Statins     Myalgias  . Valium Other (See Comments)    REACTION:  unknown  . Tequin Anxiety      Review of Systems: See HPI for pertinent ROS. All other ROS negative.    Physical Exam: Blood pressure 120/60, pulse 58, temperature 97.8 F (36.6 C), temperature source Oral, resp. rate 19, weight 177 lb (80.287 kg)., Body mass index is 26.92 kg/(m^2). General:  WNWD WM. Appears in no  acute distress. Neck: Supple. No thyromegaly. No lymphadenopathy. Lungs: Clear bilaterally to auscultation without wheezes, rales, or rhonchi. Breathing is unlabored. Heart: Regular rhythm. No murmurs, rubs, or gallops. Msk:  Strength and tone normal for age. Extremities/Skin: Warm and dry. Neuro: Alert and oriented X 3. Moves all extremities spontaneously. Gait is normal. CNII-XII grossly in tact. Psych:  Responds to questions appropriately. Appears anxious and nervous.      ASSESSMENT AND PLAN:  81 y.o. year old male with  1. Insomnia - traZODone (DESYREL) 50 MG tablet; Take 1 tablet (50 mg total) by mouth at bedtime as needed for sleep.  Dispense: 30 tablet; Refill: 3  2. Generalized anxiety disorder - traZODone (DESYREL) 50 MG tablet; Take 1 tablet (50 mg total) by mouth at bedtime as needed for sleep.  Dispense: 30 tablet; Refill: 3   I removed temazepam from his medication list and told him to stop this. I told him to replace this with trazodone 50 mg at bedtime instead. Told him to call us if he thinks this is causing any adverse effects or is not working for him. Continue all other medications the same.  Marin Olp [place], [place], [facility] 09/03/2014 12:07 PM

## 2014-09-05 ENCOUNTER — Encounter: Payer: Self-pay | Admitting: Family Medicine

## 2014-09-05 ENCOUNTER — Ambulatory Visit (INDEPENDENT_AMBULATORY_CARE_PROVIDER_SITE_OTHER): Payer: Commercial Managed Care - HMO | Admitting: Family Medicine

## 2014-09-05 VITALS — BP 120/80 | HR 56 | Temp 98.0°F | Resp 16 | Wt 177.0 lb

## 2014-09-05 DIAGNOSIS — R197 Diarrhea, unspecified: Secondary | ICD-10-CM

## 2014-09-05 MED ORDER — METRONIDAZOLE 500 MG PO TABS: 500.0000 mg | ORAL_TABLET | Freq: Three times a day (TID) | ORAL | Status: DC

## 2014-09-05 NOTE — Progress Notes (Signed)
Subjective:    Patient ID: Evan [name], male    DOB: [birth year], 81 y.o.   MRN: [id number]  HPI   07/15/14 Patient was seen in the emergency room over the weekend with 2-3 weeks of diarrhea. He was also having hematochezia and lower abdominal pain with fevers. His white blood cell count slightly elevated at 11. CT scan showed colitis. Stool sample obtained in the emergency room was positive for C. difficile by PCR. He was discharged from the hospital on Flagyl 500 mg by mouth twice a day for 14 days.  Unfortunately, his wife has been on hospice recently passed away due to severe dementia. Furthermore this patient is on chronic immunosuppressives including methotrexate for polymyalgia rheumatica which could possibly complicate treatment of C. difficile colitis. He is not presently taking any type of probiotic.    Since discharge from the hospital, his diarrhea has improved substantially. He is only had one bowel movement in the last 24 hours. His lower abdominal pain has improved dramatically. He denies any further hematochezia, fevers, or rigors.  However his blood pressure is slightly low today. He is on atenolol due to his history of coronary artery disease. He has been dehydrated due to the diarrhea.  At that time, my plan was:  Complete 14 days of Flagyl. I will also add florastor 250 mg pobid as a probiotic to help facilitate resolution of this infection. Recheck immediately if symptoms worsen. Otherwise I will check the patient back in one week. I want him to temporarily discontinue atenolol and try to drink more fluids particularly Gatorade. If there is any signs or symptoms of worsening diarrhea, I would repeat C. Difficile test by PCR immediately and consider starting the patient on vancomycin given his immunocompromised status due to the methotrexate  07/17/14 Patient states that ever since he was in the hospital he has been itching severely. He is now itching uncontrollably on his back.  Examination of his back reveals a diffuse erythematous macular rash on his back which is extending onto his chest. He states that he is allergic to the sheets in the hospital however he is also been placed on Flagyl since that time. He also states that he feels an itching sensation in his throat although he denies any swelling in his lips or times. He denies any stridor or shortness of breath.  At that time, my plan was: Patient appears to be having allergic reaction I am unsure if this is due to the metronidazole or if it could be due, as the patient feels, to the sheets in the hospital. To be cautious, I will discontinue Flagyl and replaced with vancomycin 125 mg by mouth 4 times a day for 2 weeks. Patient can use hydroxyzine 25 mg every 8 hours as needed for itching. I will also give the patient Depo-Medrol 40 mg IM 1 now. Recheck in 24 hours if no better or sooner if worse.  07/22/14 Patient is here today for follow-up. His diarrhea has completely resolved. He denies any melena or hematochezia. He denies any itching or rash. He denies any fevers or chills or abdominal pain. Overall he is back to his baseline. He is doing well. He is currently on his second week of vancomycin.  ATthat time, my plan was: Clinically the patient has improved and has completely resolved. I would like him to finish the vancomycin. I would like him to continue florastor for an additional month.  He is to recheck immediately if any diarrhea  returns. Patient understands. No specific follow-up is needed for this issue if he continues to do well off antibiotics.  09/05/14  patient has started to have diarrhea over the last 2 days. He is also having anal leakage of foul-smelling milky colored fecal fluid that he cannot control. He is concerned that the C. Difficile is returning. He denies any abdominal pain or fevers. He denies any blood in his stool. He denies any recent travel or sick contacts  Past Medical History  Diagnosis  Date  . CAD (coronary artery disease)   . BPH (benign prostatic hyperplasia)   . Arthritis   . Asthma   . GERD (gastroesophageal reflux disease)   . Hypertension   . Anxiety   . History of ASCVD     MULTIVESSEL  . 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 Hyperlipidemia   . DJD (degenerative joint disease)   . Hemochromatosis     Possible, elevated iron stores, hook-like osteophytes on hand films, normal LFTs   Past Surgical History  Procedure Laterality Date  . Cholecystectomy    . Rotator cuff repair    . Knee surgery    . Coronary artery bypass graft    . Descending aortic aneurysm repair w/ stent      DE stent ostium into the right radial free graft at OM-, 02-2007   Current Outpatient Prescriptions on File Prior to Visit  Medication Sig Dispense Refill  . atenolol (TENORMIN) 50 MG tablet TAKE 1 TABLET DAILY. 90 tablet 0  . clopidogrel (PLAVIX) 75 MG tablet TAKE ONE (1) TABLET BY MOUTH EVERY DAY 90 tablet 3  . CRESTOR 5 MG tablet TAKE ONE (1) TABLET BY MOUTH EVERY DAY 30 tablet 10  . diclofenac sodium (VOLTAREN) 1 % GEL Apply 2 g topically daily as needed (for pain in knees and feet).    Marland Kitchen FLUoxetine (PROZAC) 20 MG tablet Take 1 tablet (20 mg total) by mouth daily. 30 tablet 3  . folic acid (FOLVITE) 1 MG tablet Take 1 mg by mouth daily.    . hydrOXYzine (ATARAX/VISTARIL) 25 MG tablet Take 1 tablet (25 mg total) by mouth 3 (three) times daily as needed for itching. 30 tablet 0  . ibuprofen (ADVIL,MOTRIN) 200 MG tablet Take 400-600 mg by mouth every 6 (six) hours as needed for moderate pain (pain).    . LORazepam (ATIVAN) 1 MG tablet TAKE ONE (1) TABLET BY MOUTH 3 TIMES DAILY. TAKE AT 8AM, 12 NOON, AND4PM. 90 tablet 0  . methotrexate (RHEUMATREX) 2.5 MG tablet Take 20 mg by mouth every Monday.     . nitroGLYCERIN (NITROSTAT) 0.4 MG SL tablet Place 1 tablet (0.4 mg total) under the tongue every 5 (five) minutes as needed for chest pain. 30 tablet 3   . pantoprazole (PROTONIX) 40 MG tablet Take 1 tablet (40 mg total) by mouth daily. 10 tablet 0  . saccharomyces boulardii (FLORASTOR) 250 MG capsule Take 1 capsule (250 mg total) by mouth 2 (two) times daily. 60 capsule 0  . tamsulosin (FLOMAX) 0.4 MG CAPS capsule Take 0.4 mg by mouth daily.    . traMADol (ULTRAM) 50 MG tablet Take 50 mg by mouth every 6 (six) hours as needed for moderate pain or severe pain (pain).     . traZODone (DESYREL) 50 MG tablet Take 1 tablet (50 mg total) by mouth at bedtime as needed for sleep. 30 tablet 3   No current facility-administered medications  on file prior to visit.   Allergies  Allergen Reactions  . Doxycycline Other (See Comments)    REACTION:  unknown  . Feldene [Piroxicam] Other (See Comments)    REACTION: Blisters  . Statins     Myalgias  . Valium Other (See Comments)    REACTION:  unknown  . Tequin Anxiety   History   Social History  . Marital Status: Married    Spouse Name: N/A  . Number of Children: N/A  . Years of Education: N/A   Occupational History  . Not on file.   Social History Main Topics  . Smoking status: Never Smoker   . Smokeless tobacco: Former Systems developer  . Alcohol Use: No  . Drug Use: No  . Sexual Activity: Not on file   Other Topics Concern  . Not on file   Social History Narrative      Review of Systems  All other systems reviewed and are negative.      Objective:   Physical Exam  Constitutional: He appears well-developed and well-nourished. No distress.  Neck: Neck supple. No JVD present.  Cardiovascular: Normal rate, regular rhythm and normal heart sounds.   No murmur heard. Pulmonary/Chest: Effort normal and breath sounds normal. No stridor. No respiratory distress. He has no wheezes. He has no rales.  Abdominal: Soft. He exhibits no distension and no mass. There is no tenderness. There is no rebound and no guarding.  Musculoskeletal: He exhibits no edema.  Lymphadenopathy:    He has no cervical  adenopathy.  Skin: No rash noted. He is not diaphoretic. No erythema.    rectal exam is normal. There is some skin irritation around his anus due to the moisture. There is no external hemorrhoid or rectal masses or rectal abscess. There is no palpable internal rectal  Mass or hemorrhoid.       Assessment & Plan:  Diarrhea - Plan: Clostridium Difficile by PCR, metroNIDAZOLE (FLAGYL) 500 MG tablet  I am concerned he could be developing C. Difficile colitis again. Begin Flagyl 500 mg by mouth 3 times a day. Return stool sample immediately for PCR analysis for C. Difficile. If negative discontinue anabiotic and workup fecal leakage further. If positive, I will likely treat the patient with combination therapy for one month

## 2014-09-08 ENCOUNTER — Other Ambulatory Visit: Payer: Commercial Managed Care - HMO

## 2014-09-08 DIAGNOSIS — R197 Diarrhea, unspecified: Secondary | ICD-10-CM | POA: Diagnosis not present

## 2014-09-09 LAB — CLOSTRIDIUM DIFFICILE BY PCR: Toxigenic C. Difficile by PCR: DETECTED — CR

## 2014-09-22 ENCOUNTER — Other Ambulatory Visit: Payer: Self-pay | Admitting: Family Medicine

## 2014-09-22 NOTE — Telephone Encounter (Signed)
?   OK to Refill  

## 2014-09-22 NOTE — Telephone Encounter (Signed)
Ok to refill??  Last office visit 09/05/2014.  Last refill 08/26/2014.

## 2014-09-23 ENCOUNTER — Ambulatory Visit (INDEPENDENT_AMBULATORY_CARE_PROVIDER_SITE_OTHER): Payer: Commercial Managed Care - HMO | Admitting: Family Medicine

## 2014-09-23 ENCOUNTER — Other Ambulatory Visit: Payer: Commercial Managed Care - HMO

## 2014-09-23 ENCOUNTER — Encounter: Payer: Self-pay | Admitting: Family Medicine

## 2014-09-23 VITALS — BP 108/58 | HR 72 | Temp 97.7°F | Resp 18 | Ht 68.0 in | Wt 176.0 lb

## 2014-09-23 DIAGNOSIS — A047 Enterocolitis due to Clostridium difficile: Secondary | ICD-10-CM | POA: Diagnosis not present

## 2014-09-23 DIAGNOSIS — A0472 Enterocolitis due to Clostridium difficile, not specified as recurrent: Secondary | ICD-10-CM

## 2014-09-23 NOTE — Progress Notes (Signed)
Subjective:    Patient ID: Evan [name], male    DOB: [birth year], 81 y.o.   MRN: [id number]  HPI   07/15/14 Patient was seen in the emergency room over the weekend with 2-3 weeks of diarrhea. He was also having hematochezia and lower abdominal pain with fevers. His white blood cell count slightly elevated at 11. CT scan showed colitis. Stool sample obtained in the emergency room was positive for C. difficile by PCR. He was discharged from the hospital on Flagyl 500 mg by mouth twice a day for 14 days.  Unfortunately, his wife has been on hospice recently passed away due to severe dementia. Furthermore this patient is on chronic immunosuppressives including methotrexate for polymyalgia rheumatica which could possibly complicate treatment of C. difficile colitis. He is not presently taking any type of probiotic.    Since discharge from the hospital, his diarrhea has improved substantially. He is only had one bowel movement in the last 24 hours. His lower abdominal pain has improved dramatically. He denies any further hematochezia, fevers, or rigors.  However his blood pressure is slightly low today. He is on atenolol due to his history of coronary artery disease. He has been dehydrated due to the diarrhea.  At that time, my plan was:  Complete 14 days of Flagyl. I will also add florastor 250 mg pobid as a probiotic to help facilitate resolution of this infection. Recheck immediately if symptoms worsen. Otherwise I will check the patient back in one week. I want him to temporarily discontinue atenolol and try to drink more fluids particularly Gatorade. If there is any signs or symptoms of worsening diarrhea, I would repeat C. Difficile test by PCR immediately and consider starting the patient on vancomycin given his immunocompromised status due to the methotrexate  07/17/14 Patient states that ever since he was in the hospital he has been itching severely. He is now itching uncontrollably on his back.  Examination of his back reveals a diffuse erythematous macular rash on his back which is extending onto his chest. He states that he is allergic to the sheets in the hospital however he is also been placed on Flagyl since that time. He also states that he feels an itching sensation in his throat although he denies any swelling in his lips or times. He denies any stridor or shortness of breath.  At that time, my plan was: Patient appears to be having allergic reaction I am unsure if this is due to the metronidazole or if it could be due, as the patient feels, to the sheets in the hospital. To be cautious, I will discontinue Flagyl and replaced with vancomycin 125 mg by mouth 4 times a day for 2 weeks. Patient can use hydroxyzine 25 mg every 8 hours as needed for itching. I will also give the patient Depo-Medrol 40 mg IM 1 now. Recheck in 24 hours if no better or sooner if worse.  07/22/14 Patient is here today for follow-up. His diarrhea has completely resolved. He denies any melena or hematochezia. He denies any itching or rash. He denies any fevers or chills or abdominal pain. Overall he is back to his baseline. He is doing well. He is currently on his second week of vancomycin.  ATthat time, my plan was: Clinically the patient has improved and has completely resolved. I would like him to finish the vancomycin. I would like him to continue florastor for an additional month.  He is to recheck immediately if any diarrhea  returns. Patient understands. No specific follow-up is needed for this issue if he continues to do well off antibiotics.  09/05/14  patient has started to have diarrhea over the last 2 days. He is also having anal leakage of foul-smelling milky colored fecal fluid that he cannot control. He is concerned that the C. Difficile is returning. He denies any abdominal pain or fevers. He denies any blood in his stool. He denies any recent travel or sick contacts.  At that time, my plan was:  I am  concerned he could be developing C. Difficile colitis again. Begin Flagyl 500 mg by mouth 3 times a day. Return stool sample immediately for PCR analysis for C. Difficile. If negative discontinue anabiotic and workup fecal leakage further. If positive, I will likely treat the patient with combination therapy for one month  09/23/14 Stool test returned positive for C. Difficile by PCR.  Patient was treated with Flagyl 500 mg by mouth 3 times a day for 21 days. Patient walked into the clinic this morning requesting another anabiotic because he continues to have occasional diarrhea even though his symptoms are better after the Flagyl.  When questioned further, the patient states he goes  1-2 days without having a bowel movement and then he will have a loose bowel movement. He denies profound consistent watery diarrhea. He denies any abdominal pain. He denies any fever. He denies any melanoma or hematochezia.  He is concerned because he has moisture around his rectum and he also complains of some perirectal itching due to the moisture from fecal leakage.  Past Medical History  Diagnosis Date  . CAD (coronary artery disease)   . BPH (benign prostatic hyperplasia)   . Arthritis   . Asthma   . GERD (gastroesophageal reflux disease)   . Hypertension   . Anxiety   . History of ASCVD     MULTIVESSEL  . 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 Hyperlipidemia   . DJD (degenerative joint disease)   . Hemochromatosis     Possible, elevated iron stores, hook-like osteophytes on hand films, normal LFTs   Past Surgical History  Procedure Laterality Date  . Cholecystectomy    . Rotator cuff repair    . Knee surgery    . Coronary artery bypass graft    . Descending aortic aneurysm repair w/ stent      DE stent ostium into the right radial free graft at OM-, 02-2007   Current Outpatient Prescriptions on File Prior to Visit  Medication Sig Dispense Refill  . atenolol  (TENORMIN) 50 MG tablet TAKE 1 TABLET DAILY. 90 tablet 0  . clopidogrel (PLAVIX) 75 MG tablet TAKE ONE (1) TABLET BY MOUTH EVERY DAY 90 tablet 3  . CRESTOR 5 MG tablet TAKE ONE (1) TABLET BY MOUTH EVERY DAY 30 tablet 10  . diclofenac sodium (VOLTAREN) 1 % GEL Apply 2 g topically daily as needed (for pain in knees and feet).    Marland Kitchen FLUoxetine (PROZAC) 20 MG tablet Take 1 tablet (20 mg total) by mouth daily. 30 tablet 3  . folic acid (FOLVITE) 1 MG tablet Take 1 mg by mouth daily.    . hydrOXYzine (ATARAX/VISTARIL) 25 MG tablet Take 1 tablet (25 mg total) by mouth 3 (three) times daily as needed for itching. 30 tablet 0  . ibuprofen (ADVIL,MOTRIN) 200 MG tablet Take 400-600 mg by mouth every 6 (six) hours as needed for moderate pain (pain).    Marland Kitchen  LORazepam (ATIVAN) 1 MG tablet TAKE ONE (1) TABLET BY MOUTH 3 TIMES DAILY. TAKE AT 8AM, 12 NOON, AND4PM. 90 tablet 0  . methotrexate (RHEUMATREX) 2.5 MG tablet Take 20 mg by mouth every Monday.     . nitroGLYCERIN (NITROSTAT) 0.4 MG SL tablet Place 1 tablet (0.4 mg total) under the tongue every 5 (five) minutes as needed for chest pain. 30 tablet 3  . pantoprazole (PROTONIX) 40 MG tablet Take 1 tablet (40 mg total) by mouth daily. 10 tablet 0  . saccharomyces boulardii (FLORASTOR) 250 MG capsule Take 1 capsule (250 mg total) by mouth 2 (two) times daily. 60 capsule 0  . tamsulosin (FLOMAX) 0.4 MG CAPS capsule Take 0.4 mg by mouth daily.    . traMADol (ULTRAM) 50 MG tablet Take 50 mg by mouth every 6 (six) hours as needed for moderate pain or severe pain (pain).     . traZODone (DESYREL) 50 MG tablet Take 1 tablet (50 mg total) by mouth at bedtime as needed for sleep. 30 tablet 3   No current facility-administered medications on file prior to visit.   Allergies  Allergen Reactions  . Doxycycline Other (See Comments)    REACTION:  unknown  . Feldene [Piroxicam] Other (See Comments)    REACTION: Blisters  . Statins     Myalgias  . Valium Other (See  Comments)    REACTION:  unknown  . Tequin Anxiety   History   Social History  . Marital Status: Married    Spouse Name: N/A  . Number of Children: N/A  . Years of Education: N/A   Occupational History  . Not on file.   Social History Main Topics  . Smoking status: Never Smoker   . Smokeless tobacco: Former Systems developer  . Alcohol Use: No  . Drug Use: No  . Sexual Activity: Not on file   Other Topics Concern  . Not on file   Social History Narrative      Review of Systems  All other systems reviewed and are negative.      Objective:   Physical Exam  Constitutional: He appears well-developed and well-nourished. No distress.  Neck: Neck supple. No JVD present.  Cardiovascular: Normal rate, regular rhythm and normal heart sounds.   No murmur heard. Pulmonary/Chest: Effort normal and breath sounds normal. No stridor. No respiratory distress. He has no wheezes. He has no rales.  Abdominal: Soft. He exhibits no distension and no mass. There is no tenderness. There is no rebound and no guarding.  Musculoskeletal: He exhibits no edema.  Lymphadenopathy:    He has no cervical adenopathy.  Skin: No rash noted. He is not diaphoretic. No erythema.          Assessment & Plan:  Enteritis due to Clostridium difficile - Plan: Clostridium Difficile by PCR  I will check stool for C. Difficile by PCR. If positive I will start the patient on vancomycin 125 mg tablets QID x 14 days BID x 7 days QDAYx 7days QOD x 7 days Every 3 days x 14 days (84 tabs)   If stool samples negative for infection, I will treat the patient with a combination of Metamucil/fiber as a stool bulking agent and also Align poqday to try to repopulate the intestine with healthy bacteria.

## 2014-09-23 NOTE — Telephone Encounter (Signed)
rx called in

## 2014-09-23 NOTE — Telephone Encounter (Signed)
ok 

## 2014-09-23 NOTE — Telephone Encounter (Signed)
Why does he need this.  If still having diarrhea, he needs to be seen.

## 2014-09-25 ENCOUNTER — Other Ambulatory Visit: Payer: Self-pay | Admitting: Family Medicine

## 2014-09-25 NOTE — Telephone Encounter (Signed)
Refill denied.   Medication called to pharmacy on 09/23/2014.

## 2014-09-26 ENCOUNTER — Other Ambulatory Visit: Payer: Commercial Managed Care - HMO

## 2014-09-26 DIAGNOSIS — R197 Diarrhea, unspecified: Secondary | ICD-10-CM | POA: Diagnosis not present

## 2014-09-27 LAB — CLOSTRIDIUM DIFFICILE BY PCR: Toxigenic C. Difficile by PCR: DETECTED — CR

## 2014-09-30 ENCOUNTER — Other Ambulatory Visit: Payer: Self-pay | Admitting: Family Medicine

## 2014-09-30 ENCOUNTER — Telehealth: Payer: Self-pay | Admitting: Family Medicine

## 2014-09-30 MED ORDER — VANCOMYCIN HCL 125 MG PO CAPS: ORAL_CAPSULE | ORAL | Status: DC

## 2014-09-30 NOTE — Telephone Encounter (Signed)
Vancomycin pills will cost pt over $400.  Pharmacist says if we give him same dose in liquid compound will be around $100.  Given OK to change.

## 2014-09-30 NOTE — Telephone Encounter (Signed)
Approved.  

## 2014-10-06 ENCOUNTER — Other Ambulatory Visit: Payer: Self-pay | Admitting: Family Medicine

## 2014-10-07 NOTE — Telephone Encounter (Signed)
Medication refilled per protocol. 

## 2014-10-23 ENCOUNTER — Other Ambulatory Visit: Payer: Self-pay | Admitting: Family Medicine

## 2014-10-23 NOTE — Telephone Encounter (Signed)
?   OK to Refill  

## 2014-10-23 NOTE — Telephone Encounter (Signed)
Medication refilled per protocol. 

## 2014-10-23 NOTE — Telephone Encounter (Signed)
ok 

## 2014-11-07 ENCOUNTER — Telehealth: Payer: Self-pay | Admitting: Family Medicine

## 2014-11-07 ENCOUNTER — Encounter: Payer: Self-pay | Admitting: Family Medicine

## 2014-11-07 ENCOUNTER — Ambulatory Visit (INDEPENDENT_AMBULATORY_CARE_PROVIDER_SITE_OTHER): Payer: Commercial Managed Care - HMO | Admitting: Family Medicine

## 2014-11-07 VITALS — BP 96/58 | HR 64 | Temp 97.7°F | Resp 16 | Ht 68.0 in | Wt 176.0 lb

## 2014-11-07 DIAGNOSIS — R1013 Epigastric pain: Secondary | ICD-10-CM | POA: Diagnosis not present

## 2014-11-07 DIAGNOSIS — R1310 Dysphagia, unspecified: Secondary | ICD-10-CM | POA: Diagnosis not present

## 2014-11-07 NOTE — Telephone Encounter (Signed)
noted 

## 2014-11-07 NOTE — Telephone Encounter (Signed)
Pt submitted formed stool specimen for C-Diff.  Request has been canceled.  Lab can not perform C-Diff on a formed sample.

## 2014-11-07 NOTE — Progress Notes (Signed)
Subjective:    Patient ID: Evan [name], male    DOB: 04/13/1927, 81 y.o.   MRN: [id number]  HPI Patient presents with 3 months of epigastric discomfort. It is located just inferior to the xiphoid process. It is made worse by food. He also reports dysphagia to food. Occasionally will feel like food is getting stuck at the gastroesophageal junction. He denies any heartburn. His medicine list states that he is taking pantoprazole as well as ibuprofen. He is unsure if he is on either one of those medication. He denies any melanoma or hematochezia. He denies any angina. He denies any shortness of breath. He denies any nausea or vomiting. He denies any hematemesis. Past Medical History  Diagnosis Date  . CAD (coronary artery disease)   . BPH (benign prostatic hyperplasia)   . Arthritis   . Asthma   . GERD (gastroesophageal reflux disease)   . Hypertension   . Anxiety   . History of ASCVD     MULTIVESSEL  . 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 Hyperlipidemia   . DJD (degenerative joint disease)   . Hemochromatosis     Possible, elevated iron stores, hook-like osteophytes on hand films, normal LFTs   Past Surgical History  Procedure Laterality Date  . Cholecystectomy    . Rotator cuff repair    . Knee surgery    . Coronary artery bypass graft    . Descending aortic aneurysm repair w/ stent      DE stent ostium into the right radial free graft at [facility]-, 02-2007   Current Outpatient Prescriptions on File Prior to Visit  Medication Sig Dispense Refill  . atenolol (TENORMIN) 50 MG tablet TAKE 1 TABLET DAILY. 90 tablet 0  . clopidogrel (PLAVIX) 75 MG tablet TAKE ONE (1) TABLET BY MOUTH EVERY DAY 90 tablet 3  . CRESTOR 5 MG tablet TAKE ONE (1) TABLET BY MOUTH EVERY DAY 30 tablet 10  . diclofenac sodium (VOLTAREN) 1 % GEL Apply 2 g topically daily as needed (for pain in knees and feet).    Marland Kitchen FLUoxetine (PROZAC) 20 MG tablet Take 1 tablet (20 mg total) by  mouth daily. 30 tablet 3  . folic acid (FOLVITE) 1 MG tablet Take 1 mg by mouth daily.    . hydrOXYzine (ATARAX/VISTARIL) 25 MG tablet Take 1 tablet (25 mg total) by mouth 3 (three) times daily as needed for itching. 30 tablet 0  . ibuprofen (ADVIL,MOTRIN) 200 MG tablet Take 400-600 mg by mouth every 6 (six) hours as needed for moderate pain (pain).    . LORazepam (ATIVAN) 1 MG tablet TAKE ONE (1) TABLET BY MOUTH 3 TIMES DAILY. TAKE AT 8AM, 12 NOON, AND4PM. 90 tablet 0  . methotrexate (RHEUMATREX) 2.5 MG tablet Take 20 mg by mouth every Monday.     . nitroGLYCERIN (NITROSTAT) 0.4 MG SL tablet Place 1 tablet (0.4 mg total) under the tongue every 5 (five) minutes as needed for chest pain. 30 tablet 3  . pantoprazole (PROTONIX) 40 MG tablet Take 1 tablet (40 mg total) by mouth daily. 10 tablet 0  . saccharomyces boulardii (FLORASTOR) 250 MG capsule Take 1 capsule (250 mg total) by mouth 2 (two) times daily. 60 capsule 0  . tamsulosin (FLOMAX) 0.4 MG CAPS capsule TAKE 1 CAPSULE EVERY DAY 90 capsule 3  . traMADol (ULTRAM) 50 MG tablet Take 50 mg by mouth every 6 (six) hours as needed for moderate  pain or severe pain (pain).     . traZODone (DESYREL) 50 MG tablet Take 1 tablet (50 mg total) by mouth at bedtime as needed for sleep. 30 tablet 3   No current facility-administered medications on file prior to visit.   Allergies  Allergen Reactions  . Doxycycline Other (See Comments)    REACTION:  unknown  . Feldene [Piroxicam] Other (See Comments)    REACTION: Blisters  . Statins     Myalgias  . Valium Other (See Comments)    REACTION:  unknown  . Tequin Anxiety   History   Social History  . Marital Status: Married    Spouse Name: N/A  . Number of Children: N/A  . Years of Education: N/A   Occupational History  . Not on file.   Social History Main Topics  . Smoking status: Never Smoker   . Smokeless tobacco: Former Systems developer  . Alcohol Use: No  . Drug Use: No  . Sexual Activity: Not on  file   Other Topics Concern  . Not on file   Social History Narrative      Review of Systems  All other systems reviewed and are negative.      Objective:   Physical Exam  Constitutional: He appears well-developed and well-nourished.  Cardiovascular: Normal rate, regular rhythm and normal heart sounds.   No murmur heard. Pulmonary/Chest: Effort normal and breath sounds normal. No respiratory distress. He has no wheezes. He has no rales.  Abdominal: Soft. Bowel sounds are normal. He exhibits no distension. There is no tenderness. There is no rebound and no guarding.  Musculoskeletal: He exhibits no edema.  Vitals reviewed.         Assessment & Plan:  Dysphagia  Abdominal pain, epigastric  I believe the patient is dealing with gastroesophageal reflux disease which is likely contributing to the dysphagia. He may even have an esophageal stricture. I will have the patient begin dexilant 60 mg poqam and discontinue any NSAIDs including ibuprofen. I've also asked the patient to discontinue pantoprazole if he is in fact taking the medication. I would like to recheck the patient in 2 weeks and if his symptoms are not improving at that point, I will consult GI for possible EGD.

## 2014-11-09 ENCOUNTER — Encounter (HOSPITAL_COMMUNITY)
Admission: EM | Disposition: A | Discharge status: Home / Self Care | Payer: Self-pay | Source: Home / Self Care | Attending: Emergency Medicine

## 2014-11-09 ENCOUNTER — Ambulatory Visit (HOSPITAL_COMMUNITY)
Admission: EM | Admit: 2014-11-09 | Discharge: 2014-11-09 | Disposition: A | Discharge status: Home / Self Care | Payer: Commercial Managed Care - HMO | Attending: Emergency Medicine | Admitting: Emergency Medicine

## 2014-11-09 ENCOUNTER — Encounter (HOSPITAL_COMMUNITY): Payer: Self-pay | Admitting: Emergency Medicine

## 2014-11-09 DIAGNOSIS — Z881 Allergy status to other antibiotic agents status: Secondary | ICD-10-CM | POA: Insufficient documentation

## 2014-11-09 DIAGNOSIS — Z7902 Long term (current) use of antithrombotics/antiplatelets: Secondary | ICD-10-CM | POA: Diagnosis not present

## 2014-11-09 DIAGNOSIS — M1990 Unspecified osteoarthritis, unspecified site: Secondary | ICD-10-CM | POA: Insufficient documentation

## 2014-11-09 DIAGNOSIS — I10 Essential (primary) hypertension: Secondary | ICD-10-CM | POA: Insufficient documentation

## 2014-11-09 DIAGNOSIS — I2510 Atherosclerotic heart disease of native coronary artery without angina pectoris: Secondary | ICD-10-CM | POA: Insufficient documentation

## 2014-11-09 DIAGNOSIS — Z888 Allergy status to other drugs, medicaments and biological substances status: Secondary | ICD-10-CM | POA: Diagnosis not present

## 2014-11-09 DIAGNOSIS — N400 Enlarged prostate without lower urinary tract symptoms: Secondary | ICD-10-CM | POA: Insufficient documentation

## 2014-11-09 DIAGNOSIS — X58XXXA Exposure to other specified factors, initial encounter: Secondary | ICD-10-CM | POA: Diagnosis not present

## 2014-11-09 DIAGNOSIS — E785 Hyperlipidemia, unspecified: Secondary | ICD-10-CM | POA: Insufficient documentation

## 2014-11-09 DIAGNOSIS — F419 Anxiety disorder, unspecified: Secondary | ICD-10-CM | POA: Diagnosis not present

## 2014-11-09 DIAGNOSIS — Z87891 Personal history of nicotine dependence: Secondary | ICD-10-CM | POA: Diagnosis not present

## 2014-11-09 DIAGNOSIS — T18128A Food in esophagus causing other injury, initial encounter: Secondary | ICD-10-CM | POA: Insufficient documentation

## 2014-11-09 DIAGNOSIS — K219 Gastro-esophageal reflux disease without esophagitis: Secondary | ICD-10-CM | POA: Insufficient documentation

## 2014-11-09 DIAGNOSIS — K222 Esophageal obstruction: Secondary | ICD-10-CM | POA: Diagnosis not present

## 2014-11-09 DIAGNOSIS — Z79899 Other long term (current) drug therapy: Secondary | ICD-10-CM | POA: Insufficient documentation

## 2014-11-09 DIAGNOSIS — J45909 Unspecified asthma, uncomplicated: Secondary | ICD-10-CM | POA: Insufficient documentation

## 2014-11-09 HISTORY — PX: ESOPHAGOGASTRODUODENOSCOPY: SHX5428

## 2014-11-09 LAB — CBC WITH DIFFERENTIAL/PLATELET
Basophils Absolute: 0 10*3/uL (ref 0.0–0.1)
Basophils Relative: 0 % (ref 0–1)
Eosinophils Absolute: 0.1 10*3/uL (ref 0.0–0.7)
Eosinophils Relative: 1 % (ref 0–5)
HCT: 40.5 % (ref 39.0–52.0)
Hemoglobin: 13.7 g/dL (ref 13.0–17.0)
Lymphocytes Relative: 13 % (ref 12–46)
Lymphs Abs: 1 10*3/uL (ref 0.7–4.0)
MCH: 32.9 pg (ref 26.0–34.0)
MCHC: 33.8 g/dL (ref 30.0–36.0)
MCV: 97.1 fL (ref 78.0–100.0)
Monocytes Absolute: 1 10*3/uL (ref 0.1–1.0)
Monocytes Relative: 13 % — ABNORMAL HIGH (ref 3–12)
Neutro Abs: 5.4 10*3/uL (ref 1.7–7.7)
Neutrophils Relative %: 73 % (ref 43–77)
Platelets: 153 10*3/uL (ref 150–400)
RBC: 4.17 MIL/uL — ABNORMAL LOW (ref 4.22–5.81)
RDW: 14.2 % (ref 11.5–15.5)
WBC: 7.5 10*3/uL (ref 4.0–10.5)

## 2014-11-09 LAB — COMPREHENSIVE METABOLIC PANEL
ALT: 18 U/L (ref 17–63)
AST: 23 U/L (ref 15–41)
Albumin: 4 g/dL (ref 3.5–5.0)
Alkaline Phosphatase: 83 U/L (ref 38–126)
Anion gap: 8 (ref 5–15)
BUN: 13 mg/dL (ref 6–20)
CO2: 26 mmol/L (ref 22–32)
Calcium: 9.3 mg/dL (ref 8.9–10.3)
Chloride: 103 mmol/L (ref 101–111)
Creatinine, Ser: 0.97 mg/dL (ref 0.61–1.24)
GFR calc Af Amer: >60 mL/min (ref >60)
GFR calc non Af Amer: >60 mL/min (ref >60)
Glucose, Bld: 104 mg/dL — ABNORMAL HIGH (ref 65–99)
Potassium: 4.4 mmol/L (ref 3.5–5.1)
Sodium: 137 mmol/L (ref 135–145)
Total Bilirubin: 0.6 mg/dL (ref 0.3–1.2)
Total Protein: 6.8 g/dL (ref 6.5–8.1)

## 2014-11-09 LAB — LIPASE, BLOOD: Lipase: 36 U/L (ref 22–51)

## 2014-11-09 SURGERY — EGD (ESOPHAGOGASTRODUODENOSCOPY)
Anesthesia: Moderate Sedation

## 2014-11-09 MED ORDER — MIDAZOLAM HCL 5 MG/ML IJ SOLN
INTRAMUSCULAR | Status: AC
  Filled 2014-11-09: qty 2

## 2014-11-09 MED ORDER — BUTAMBEN-TETRACAINE-BENZOCAINE 2-2-14 % EX AERO
INHALATION_SPRAY | CUTANEOUS | Status: DC | PRN
Start: 2014-11-09 — End: 2014-11-09
  Administered 2014-11-09: 2 via TOPICAL

## 2014-11-09 MED ORDER — CLOPIDOGREL BISULFATE 75 MG PO TABS: ORAL_TABLET | ORAL | Status: DC

## 2014-11-09 MED ORDER — FENTANYL CITRATE (PF) 100 MCG/2ML IJ SOLN
INTRAMUSCULAR | Status: DC | PRN
  Administered 2014-11-09 (×3): 25 ug via INTRAVENOUS

## 2014-11-09 MED ORDER — DIPHENHYDRAMINE HCL 50 MG/ML IJ SOLN
INTRAMUSCULAR | Status: AC
  Filled 2014-11-09: qty 1

## 2014-11-09 MED ORDER — FENTANYL CITRATE (PF) 100 MCG/2ML IJ SOLN
INTRAMUSCULAR | Status: AC
  Filled 2014-11-09: qty 2

## 2014-11-09 MED ORDER — MIDAZOLAM HCL 10 MG/2ML IJ SOLN
INTRAMUSCULAR | Status: DC | PRN
  Administered 2014-11-09 (×3): 2 mg via INTRAVENOUS

## 2014-11-09 MED ORDER — SODIUM CHLORIDE 0.9 % IV SOLN
INTRAVENOUS | Status: DC
  Administered 2014-11-09: 500 mL via INTRAVENOUS
  Administered 2014-11-09: 19:00:00 via INTRAVENOUS

## 2014-11-09 NOTE — Interval H&P Note (Signed)
History and Physical Interval Note:  11/09/2014 6:49 PM  Evan [name]  has presented today for surgery, with the diagnosis of esophageal obstruction  The various methods of treatment have been discussed with the patient and family. After consideration of risks, benefits and other options for treatment, the patient has consented to  Procedure(s): ESOPHAGOGASTRODUODENOSCOPY (EGD) (N/A) with relief of esophageal obstruction as a surgical intervention .  The patient's history has been reviewed, patient examined, no change in status, stable for surgery.  I have reviewed the patient's chart and labs. Risks including, bleeding, aspiration and perforation were explained to the patient and he wishes to procede.  Questions were answered to the patient's satisfaction.     Trejan Buda,Forrest C

## 2014-11-09 NOTE — ED Provider Notes (Signed)
CSN: [id number]     Arrival date & time 11/09/14  1435 History   First MD Initiated Contact with Patient 11/09/14 1634     Chief Complaint  Patient presents with  . Dysphagia  . Gastrophageal Reflux     (Consider location/radiation/quality/duration/timing/severity/associated sxs/prior Treatment) HPI  81 year old male presents with inability to pass down fluids or food since around 12:00 today. Started while at lunch, was eating cake when this started. For the past 3 weeks he's been having progressive pain with swallowing and reflux. He was started on dexilant 2 days ago by his PCP. Patient states that over these 3 weeks she's been having difficulty swallowing but is able to get food and fluids down until today. He is complaining of some pain in his chest for the food seems to be stuck. No nausea or vomiting. He describes "hot phlegm" the keep coming up anytime he tries to swallow something. He is not coughing however. No shortness of breath. Does not have a gastroenterologist. No weight loss.  Past Medical History  Diagnosis Date  . CAD (coronary artery disease)   . BPH (benign prostatic hyperplasia)   . Arthritis   . Asthma   . GERD (gastroesophageal reflux disease)   . Hypertension   . Anxiety   . History of ASCVD     MULTIVESSEL  . 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 Hyperlipidemia   . DJD (degenerative joint disease)   . Hemochromatosis     Possible, elevated iron stores, hook-like osteophytes on hand films, normal LFTs   Past Surgical History  Procedure Laterality Date  . Cholecystectomy    . Rotator cuff repair    . Knee surgery    . Coronary artery bypass graft    . Descending aortic aneurysm repair w/ stent      DE stent ostium into the right radial free graft at OM-, 02-2007   Family History  Problem Relation Age of Onset  . Arthritis-Osteo Sister    History  Substance Use Topics  . Smoking status: Never Smoker   . Smokeless  tobacco: Former Systems developer  . Alcohol Use: No    Review of Systems  HENT: Positive for trouble swallowing.   Respiratory: Negative for shortness of breath.   Gastrointestinal: Positive for abdominal pain. Negative for vomiting.  All other systems reviewed and are negative.     Allergies  Doxycycline; Feldene; Statins; Valium; and Tequin  Home Medications   Prior to Admission medications   Medication Sig Start Date End Date Taking? Authorizing Provider  atenolol (TENORMIN) 50 MG tablet TAKE 1 TABLET DAILY. 04/08/14   Jettie Booze, MD  clopidogrel (PLAVIX) 75 MG tablet TAKE ONE (1) TABLET BY MOUTH EVERY DAY 04/28/14   Jettie Booze, MD  CRESTOR 5 MG tablet TAKE ONE (1) TABLET BY MOUTH EVERY DAY 03/05/14   Jettie Booze, MD  diclofenac sodium (VOLTAREN) 1 % GEL Apply 2 g topically daily as needed (for pain in knees and feet).    Historical Provider, MD  FLUoxetine (PROZAC) 20 MG tablet Take 1 tablet (20 mg total) by mouth daily. 05/12/14   [name], MD  folic acid (FOLVITE) 1 MG tablet Take 1 mg by mouth daily.    Historical Provider, MD  hydrOXYzine (ATARAX/VISTARIL) 25 MG tablet Take 1 tablet (25 mg total) by mouth 3 (three) times daily as needed for itching. 07/17/14   [name], MD  ibuprofen (ADVIL,MOTRIN) 200 MG tablet Take 400-600 mg by mouth every 6 (six) hours as needed for moderate pain (pain).    Historical Provider, MD  LORazepam (ATIVAN) 1 MG tablet TAKE ONE (1) TABLET BY MOUTH 3 TIMES DAILY. TAKE AT [facility] [place], [place]. 10/23/14   [name], MD  methotrexate (RHEUMATREX) 2.5 MG tablet Take 20 mg by mouth every Monday.     Historical Provider, MD  nitroGLYCERIN (NITROSTAT) 0.4 MG SL tablet Place 1 tablet (0.4 mg total) under the tongue every 5 (five) minutes as needed for chest pain. 04/28/14   Jettie Booze, MD  pantoprazole (PROTONIX) 40 MG tablet Take 1 tablet (40 mg total) by mouth daily. 02/05/14   Jettie Booze, MD  saccharomyces  boulardii (FLORASTOR) 250 MG capsule Take 1 capsule (250 mg total) by mouth 2 (two) times daily. 07/22/14   [name], MD  tamsulosin (FLOMAX) 0.4 MG CAPS capsule TAKE 1 CAPSULE EVERY DAY 10/07/14   [name], MD  traMADol (ULTRAM) 50 MG tablet Take 50 mg by mouth every 6 (six) hours as needed for moderate pain or severe pain (pain).     Historical Provider, MD  traZODone (DESYREL) 50 MG tablet Take 1 tablet (50 mg total) by mouth at bedtime as needed for sleep. 09/03/14   Mary B Dixon, PA-C   BP 173/77 mmHg  Pulse 51  Temp(Src) 98.1 F (36.7 C)  Resp 18  Ht '5\' 3"'$  (1.6 m)  Wt 177 lb (80.287 kg)  BMI 31.36 kg/m2  SpO2 99% Physical Exam  Constitutional: He is oriented to person, place, and time. He appears well-developed and well-nourished.  HENT:  Head: Normocephalic and atraumatic.  Right Ear: External ear normal.  Left Ear: External ear normal.  Nose: Nose normal.  Eyes: Right eye exhibits no discharge. Left eye exhibits no discharge.  Neck: Neck supple.  Cardiovascular: Normal rate, regular rhythm, normal heart sounds and intact distal pulses.   Pulmonary/Chest: Effort normal and breath sounds normal.  Abdominal: Soft. He exhibits no distension. There is no tenderness.  Musculoskeletal: He exhibits no edema.  Neurological: He is alert and oriented to person, place, and time.  Skin: Skin is warm and dry.  Nursing note and vitals reviewed.   ED Course  Procedures (including critical care time) Labs Review Labs Reviewed  COMPREHENSIVE METABOLIC PANEL - Abnormal; Notable for the following:    Glucose, Bld 104 (*)    All other components within normal limits  CBC WITH DIFFERENTIAL/PLATELET - Abnormal; Notable for the following:    RBC 4.17 (*)    Monocytes Relative 13 (*)    All other components within normal limits  LIPASE, BLOOD    Imaging Review No results found.   EKG Interpretation None      MDM   Final diagnoses:  Food impaction of esophagus,  initial encounter    Patient appears to have a food impaction in his esophagus. No distress but cannot keep down even small amounts of water when I gave this to him at the bedside. Protecting his airway. Consulted GI, Dr. Cristina Gong, GI has come to remove food impaction and states that a stricture is the cause. Patient has awoken from sedation and now is awake and alert. Stable for d/c home with GI f/u.    Sherwood Gambler, MD 11/09/14 [phone]

## 2014-11-09 NOTE — H&P (View-Only) (Signed)
Subjective:    Patient ID: Evan [name], male    DOB: [birth year], 81 y.o.   MRN: [id number]  HPI Patient presents with 3 months of epigastric discomfort. It is located just inferior to the xiphoid process. It is made worse by food. He also reports dysphagia to food. Occasionally will feel like food is getting stuck at the gastroesophageal junction. He denies any heartburn. His medicine list states that he is taking pantoprazole as well as ibuprofen. He is unsure if he is on either one of those medication. He denies any melanoma or hematochezia. He denies any angina. He denies any shortness of breath. He denies any nausea or vomiting. He denies any hematemesis. Past Medical History  Diagnosis Date  . CAD (coronary artery disease)   . BPH (benign prostatic hyperplasia)   . Arthritis   . Asthma   . GERD (gastroesophageal reflux disease)   . Hypertension   . Anxiety   . History of ASCVD     MULTIVESSEL  . 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 Hyperlipidemia   . DJD (degenerative joint disease)   . Hemochromatosis     Possible, elevated iron stores, hook-like osteophytes on hand films, normal LFTs   Past Surgical History  Procedure Laterality Date  . Cholecystectomy    . Rotator cuff repair    . Knee surgery    . Coronary artery bypass graft    . Descending aortic aneurysm repair w/ stent      DE stent ostium into the right radial free graft at OM-, 02-2007   Current Outpatient Prescriptions on File Prior to Visit  Medication Sig Dispense Refill  . atenolol (TENORMIN) 50 MG tablet TAKE 1 TABLET DAILY. 90 tablet 0  . clopidogrel (PLAVIX) 75 MG tablet TAKE ONE (1) TABLET BY MOUTH EVERY DAY 90 tablet 3  . CRESTOR 5 MG tablet TAKE ONE (1) TABLET BY MOUTH EVERY DAY 30 tablet 10  . diclofenac sodium (VOLTAREN) 1 % GEL Apply 2 g topically daily as needed (for pain in knees and feet).    Marland Kitchen FLUoxetine (PROZAC) 20 MG tablet Take 1 tablet (20 mg total) by  mouth daily. 30 tablet 3  . folic acid (FOLVITE) 1 MG tablet Take 1 mg by mouth daily.    . hydrOXYzine (ATARAX/VISTARIL) 25 MG tablet Take 1 tablet (25 mg total) by mouth 3 (three) times daily as needed for itching. 30 tablet 0  . ibuprofen (ADVIL,MOTRIN) 200 MG tablet Take 400-600 mg by mouth every 6 (six) hours as needed for moderate pain (pain).    . LORazepam (ATIVAN) 1 MG tablet TAKE ONE (1) TABLET BY MOUTH 3 TIMES DAILY. TAKE AT 8AM, 12 NOON, AND4PM. 90 tablet 0  . methotrexate (RHEUMATREX) 2.5 MG tablet Take 20 mg by mouth every Monday.     . nitroGLYCERIN (NITROSTAT) 0.4 MG SL tablet Place 1 tablet (0.4 mg total) under the tongue every 5 (five) minutes as needed for chest pain. 30 tablet 3  . pantoprazole (PROTONIX) 40 MG tablet Take 1 tablet (40 mg total) by mouth daily. 10 tablet 0  . saccharomyces boulardii (FLORASTOR) 250 MG capsule Take 1 capsule (250 mg total) by mouth 2 (two) times daily. 60 capsule 0  . tamsulosin (FLOMAX) 0.4 MG CAPS capsule TAKE 1 CAPSULE EVERY DAY 90 capsule 3  . traMADol (ULTRAM) 50 MG tablet Take 50 mg by mouth every 6 (six) hours as needed for moderate  pain or severe pain (pain).     . traZODone (DESYREL) 50 MG tablet Take 1 tablet (50 mg total) by mouth at bedtime as needed for sleep. 30 tablet 3   No current facility-administered medications on file prior to visit.   Allergies  Allergen Reactions  . Doxycycline Other (See Comments)    REACTION:  unknown  . Feldene [Piroxicam] Other (See Comments)    REACTION: Blisters  . Statins     Myalgias  . Valium Other (See Comments)    REACTION:  unknown  . Tequin Anxiety   History   Social History  . Marital Status: Married    Spouse Name: N/A  . Number of Children: N/A  . Years of Education: N/A   Occupational History  . Not on file.   Social History Main Topics  . Smoking status: Never Smoker   . Smokeless tobacco: Former Systems developer  . Alcohol Use: No  . Drug Use: No  . Sexual Activity: Not on  file   Other Topics Concern  . Not on file   Social History Narrative      Review of Systems  All other systems reviewed and are negative.      Objective:   Physical Exam  Constitutional: He appears well-developed and well-nourished.  Cardiovascular: Normal rate, regular rhythm and normal heart sounds.   No murmur heard. Pulmonary/Chest: Effort normal and breath sounds normal. No respiratory distress. He has no wheezes. He has no rales.  Abdominal: Soft. Bowel sounds are normal. He exhibits no distension. There is no tenderness. There is no rebound and no guarding.  Musculoskeletal: He exhibits no edema.  Vitals reviewed.         Assessment & Plan:  Dysphagia  Abdominal pain, epigastric  I believe the patient is dealing with gastroesophageal reflux disease which is likely contributing to the dysphagia. He may even have an esophageal stricture. I will have the patient begin dexilant 60 mg poqam and discontinue any NSAIDs including ibuprofen. I've also asked the patient to discontinue pantoprazole if he is in fact taking the medication. I would like to recheck the patient in 2 weeks and if his symptoms are not improving at that point, I will consult GI for possible EGD.

## 2014-11-09 NOTE — Op Note (Signed)
[facility] [place], [address]   ENDOSCOPY PROCEDURE REPORT  PATIENT: Evan [name], Evan [name]  MR#: [id number] BIRTHDATE: July 27, 1927 , 51  yrs. old GENDER: male ENDOSCOPIST: [name], MD REFERRED BY: PROCEDURE DATE:  11-10-2014 PROCEDURE: ASA CLASS: INDICATIONS:  esophageal food impaction MEDICATIONS: 75 g fentanyl, 6 mg Versed TOPICAL ANESTHETIC: Cetacaine spray  DESCRIPTION OF PROCEDURE: After the risks benefits and alternatives of the procedure were thoroughly explained, informed consent was obtained.  The    endoscope was introduced through the mouth and advanced to the second portion of the duodenum , Without limitations.  The instrument was slowly withdrawn as the mucosa was fully examined. Estimated blood loss is zero unless otherwise noted in this procedure report.    esophagus: Several amorphous solid illnesses of white food material were seen in the distal esophagus. The scope was pushed down toward the GE junction which did appear to be somewhat strictured and narrowed. The scope went through the GE junction with some mild resistance carrying some of the food with it.. Retroflexed view of the cardia was unremarkable. The stomach was not thoroughly examined and the duodenum was not entered. The scope was withdrawn back into the esophagus and several more pieces of food bolus were pushed into the stomach relatively easily. Once the esophagus was cleared of solid material inspection revealed a distal inflamed stricture with exudate extending 3 or 4 cm, proximal to the squamocolumnar align which was clearly identified. There is no visible suspicion of malignancy. There was no active bleeding resulting from the procedure despite the patient being on Plavix.              The scope was then withdrawn from the patient and the procedure completed.  COMPLICATIONS: There were no immediate complications.  ENDOSCOPIC IMPRESSION: solid  food impaction of the esophagus, successfully relieved endoscopically Distal inflamed stricture most consistent with reflux  RECOMMENDATIONS: hold Plavix for 4 days Continued Dexilant very soft mechanical diet carefully chewed and eaten in small bites Follow-up in my office in 4 weeks and will schedule repeat endoscopy with dilatation.  REPEAT EXAM:  eSigned:  [name], MD 2014/11/10 7:35 PM    CC:  CPT CODES: ICD CODES:  The ICD and CPT codes recommended by this software are interpretations from the data that the clinical staff has captured with the software.  The verification of the translation of this report to the ICD and CPT codes and modifiers is the sole responsibility of the health care institution and practicing physician where this report was generated.  [employer] will not be held responsible for the validity of the ICD and CPT codes included on this report.  AMA assumes no liability for data contained or not contained herein. CPT is a Designer, television/film set of the [employer].  PATIENT NAME:  Evan [name], Evan [name] MR#: [id number]

## 2014-11-09 NOTE — ED Notes (Signed)
Endo at bedside

## 2014-11-09 NOTE — Discharge Instructions (Signed)
YOU HAD AN ENDOSCOPIC PROCEDURE TODAY: Refer to the procedure report that was given to you for any specific questions about what was found during the examination.  If the procedure report does not answer your questions, please call your gastroenterologist to clarify.  YOU SHOULD EXPECT: Some feelings of bloating in the abdomen. Passage of more gas than usual.  Walking can help get rid of the air that was put into your GI tract during the procedure and reduce the bloating. If you had a lower endoscopy (such as a colonoscopy or flexible sigmoidoscopy) you may notice spotting of blood in your stool or on the toilet paper.   DIET: Clear liquids only tonight. Very soft diet taken in small bites and followed with liquid between bites. Avoid chicken steak pork or any underground meats. Avoid bread and any large bites of solid foods such as apples, potatoes, etc. until seen by Dr. Amedeo Plenty. Very important to continue taking your acid blocker medicine Dexilant. Do not resume taking Plavix until Friday, August 12  ACTIVITY: Your care partner should take you home directly after the procedure.  You should plan to take it easy, moving slowly for the rest of the day.  You can resume normal activity the day after the procedure however you should NOT DRIVE or use heavy machinery for 24 hours (because of the sedation medicines used during the test).    SYMPTOMS TO REPORT IMMEDIATELY  A gastroenterologist can be reached at any hour.  Please call your doctor's office for any of the following symptoms:   Following lower endoscopy (colonoscopy, flexible sigmoidoscopy)  Excessive amounts of blood in the stool  Significant tenderness, worsening of abdominal pains  Swelling of the abdomen that is new, acute  Fever of 100 or higher  Following upper endoscopy (EGD, EUS, ERCP)  Vomiting of blood or coffee ground material  New, significant abdominal pain  New, significant chest pain or pain under the shoulder  blades  Painful or persistently difficult swallowing  New shortness of breath  Black, tarry-looking stools  FOLLOW UP:  Please also call your gastroenterologist's office with any specific questions about appointments or follow up tests. Make an appointment with Dr. Amedeo Plenty for 4 weeks, [phone]

## 2014-11-09 NOTE — ED Notes (Signed)
Pt. Stated, Evan [name] been having problem swallowing and its worse today, its been going on for about 3 weeks,.  i went to doctor yesterday and he gave me Dexilant. But today its like my food has got stuck. I've been hot phlegm up.

## 2014-11-10 ENCOUNTER — Encounter (HOSPITAL_COMMUNITY): Payer: Self-pay | Admitting: Gastroenterology

## 2014-11-21 ENCOUNTER — Ambulatory Visit (INDEPENDENT_AMBULATORY_CARE_PROVIDER_SITE_OTHER): Payer: Commercial Managed Care - HMO | Admitting: Family Medicine

## 2014-11-21 ENCOUNTER — Encounter: Payer: Self-pay | Admitting: Family Medicine

## 2014-11-21 VITALS — BP 154/82 | HR 68 | Temp 98.1°F | Resp 18 | Ht 68.0 in | Wt 174.0 lb

## 2014-11-21 DIAGNOSIS — R1310 Dysphagia, unspecified: Secondary | ICD-10-CM

## 2014-11-21 MED ORDER — PANTOPRAZOLE SODIUM 40 MG PO TBEC: 40.0000 mg | DELAYED_RELEASE_TABLET | Freq: Every day | ORAL | Status: DC

## 2014-11-21 NOTE — Progress Notes (Signed)
Subjective:    Patient ID: Evan [name], male    DOB: 1927/09/27, 81 y.o.   MRN: [id number]  HPI 11/07/14 Patient presents with 3 months of epigastric discomfort. It is located just inferior to the xiphoid process. It is made worse by food. He also reports dysphagia to food. Occasionally will feel like food is getting stuck at the gastroesophageal junction. He denies any heartburn. His medicine list states that he is taking pantoprazole as well as ibuprofen. He is unsure if he is on either one of those medication. He denies any melanoma or hematochezia. He denies any angina. He denies any shortness of breath. He denies any nausea or vomiting. He denies any hematemesis. AT that time, my plan was: I believe the patient is dealing with gastroesophageal reflux disease which is likely contributing to the dysphagia. He may even have an esophageal stricture. I will have the patient begin dexilant 60 mg poqam and discontinue any NSAIDs including ibuprofen. I've also asked the patient to discontinue pantoprazole if he is in fact taking the medication. I would like to recheck the patient in 2 weeks and if his symptoms are not improving at that point, I will consult GI for possible EGD.  11/21/14 Shortly thereafter, the patient went to the emergency room with the food impaction. Dr. Amedeo Plenty performed an EGD to remove the food impaction and also dilated and esophageal stricture. Patient is here today for follow-up. He continues to complain of difficulty swallowing. He states that food will continue to stick in his esophagus. At times he will have liquid reflux up into his mouth and will have to vomit what he is just swallowed.  He has stopped taking the proton pump inhibitor Past Medical History  Diagnosis Date  . CAD (coronary artery disease)   . BPH (benign prostatic hyperplasia)   . Arthritis   . Asthma   . GERD (gastroesophageal reflux disease)   . Hypertension   . Anxiety   . History of ASCVD    MULTIVESSEL  . 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 Hyperlipidemia   . DJD (degenerative joint disease)   . Hemochromatosis     Possible, elevated iron stores, hook-like osteophytes on hand films, normal LFTs   Past Surgical History  Procedure Laterality Date  . Cholecystectomy    . Rotator cuff repair    . Knee surgery    . Coronary artery bypass graft    . Descending aortic aneurysm repair w/ stent      DE stent ostium into the right radial free graft at OM-, 02-2007  . Esophagogastroduodenoscopy N/A 11/09/2014    Procedure: ESOPHAGOGASTRODUODENOSCOPY (EGD);  Surgeon: [name], MD;  Location[facility] ENDOSCOPY;  Service: Endoscopy;  Laterality: N/A;   Current Outpatient Prescriptions on File Prior to Visit  Medication Sig Dispense Refill  . atenolol (TENORMIN) 50 MG tablet TAKE 1 TABLET DAILY. 90 tablet 0  . Ca Carbonate-Mag Hydroxide (ROLAIDS) 550-110 MG CHEW Chew 1 tablet by mouth daily as needed (for heartburn).    . clopidogrel (PLAVIX) 75 MG tablet Do not resume taking  this medicine until Friday, August 12. Then resume taking on a daily basis 90 tablet 3  . CRESTOR 5 MG tablet TAKE ONE (1) TABLET BY MOUTH EVERY DAY 30 tablet 10  . dexlansoprazole (DEXILANT) 60 MG capsule Take 60 mg by mouth daily.    . diclofenac sodium (VOLTAREN) 1 % GEL Apply 2 g topically  daily as needed (for pain in knees and feet).    Marland Kitchen FLUoxetine (PROZAC) 20 MG tablet Take 1 tablet (20 mg total) by mouth daily. 30 tablet 3  . folic acid (FOLVITE) 1 MG tablet Take 1 mg by mouth daily.    . hydrOXYzine (ATARAX/VISTARIL) 25 MG tablet Take 1 tablet (25 mg total) by mouth 3 (three) times daily as needed for itching. 30 tablet 0  . LORazepam (ATIVAN) 1 MG tablet TAKE ONE (1) TABLET BY MOUTH 3 TIMES DAILY. TAKE AT 8AM, 12 NOON, AND4PM. 90 tablet 0  . Melatonin 5 MG TABS Take 5 mg by mouth at bedtime.    . methotrexate (RHEUMATREX) 2.5 MG tablet Take 20 mg by mouth every Monday.       . nitroGLYCERIN (NITROSTAT) 0.4 MG SL tablet Place 1 tablet (0.4 mg total) under the tongue every 5 (five) minutes as needed for chest pain. 30 tablet 3  . pantoprazole (PROTONIX) 40 MG tablet Take 1 tablet (40 mg total) by mouth daily. 10 tablet 0  . predniSONE (DELTASONE) 5 MG tablet Take 5 mg by mouth daily with breakfast.    . saccharomyces boulardii (FLORASTOR) 250 MG capsule Take 1 capsule (250 mg total) by mouth 2 (two) times daily. 60 capsule 0  . tamsulosin (FLOMAX) 0.4 MG CAPS capsule TAKE 1 CAPSULE EVERY DAY 90 capsule 3  . traZODone (DESYREL) 50 MG tablet Take 1 tablet (50 mg total) by mouth at bedtime as needed for sleep. (Patient taking differently: Take 50 mg by mouth at bedtime. ) 30 tablet 3   No current facility-administered medications on file prior to visit.   Allergies  Allergen Reactions  . Feldene [Piroxicam] Other (See Comments)    REACTION: Blisters  . Statins Other (See Comments)    Myalgias  . Doxycycline Other (See Comments)    REACTION:  unknown  . Tequin Anxiety  . Valium Other (See Comments)    REACTION:  unknown   Social History   Social History  . Marital Status: Married    Spouse Name: N/A  . Number of Children: N/A  . Years of Education: N/A   Occupational History  . Not on file.   Social History Main Topics  . Smoking status: Never Smoker   . Smokeless tobacco: Former Systems developer  . Alcohol Use: No  . Drug Use: No  . Sexual Activity: Not on file   Other Topics Concern  . Not on file   Social History Narrative      Review of Systems  All other systems reviewed and are negative.      Objective:   Physical Exam  Constitutional: He appears well-developed and well-nourished.  Cardiovascular: Normal rate, regular rhythm and normal heart sounds.   No murmur heard. Pulmonary/Chest: Effort normal and breath sounds normal. No respiratory distress. He has no wheezes. He has no rales.  Abdominal: Soft. Bowel sounds are normal. He exhibits  no distension. There is no tenderness. There is no rebound and no guarding.  Musculoskeletal: He exhibits no edema.  Vitals reviewed.         Assessment & Plan:  Dysphagia - Plan: DG Esophagus, Ambulatory referral to Gastroenterology  I will schedule the patient to follow with his gastroenterologist. I will schedule the patient for a barium swallow/swallow study to evaluate for possible motility disorders. I will the patient continue the proton pump inhibitor. Resume protonix 40 mg by mouth daily

## 2014-11-28 ENCOUNTER — Telehealth: Payer: Self-pay | Admitting: Family Medicine

## 2014-11-28 ENCOUNTER — Encounter: Payer: Self-pay | Admitting: Family Medicine

## 2014-11-28 ENCOUNTER — Other Ambulatory Visit: Payer: Self-pay | Admitting: Family Medicine

## 2014-11-28 ENCOUNTER — Ambulatory Visit (INDEPENDENT_AMBULATORY_CARE_PROVIDER_SITE_OTHER): Payer: Commercial Managed Care - HMO | Admitting: Family Medicine

## 2014-11-28 VITALS — BP 100/60 | HR 58 | Temp 98.2°F | Resp 18 | Ht 68.0 in | Wt 174.0 lb

## 2014-11-28 DIAGNOSIS — L259 Unspecified contact dermatitis, unspecified cause: Secondary | ICD-10-CM | POA: Diagnosis not present

## 2014-11-28 DIAGNOSIS — B001 Herpesviral vesicular dermatitis: Secondary | ICD-10-CM

## 2014-11-28 MED ORDER — FLUOCINONIDE-E 0.05 % EX CREA: 1.0000 "application " | TOPICAL_CREAM | Freq: Two times a day (BID) | CUTANEOUS | Status: DC

## 2014-11-28 MED ORDER — VALACYCLOVIR HCL 1 G PO TABS: 2000.0000 mg | ORAL_TABLET | Freq: Two times a day (BID) | ORAL | Status: DC

## 2014-11-28 NOTE — Telephone Encounter (Signed)
LRF Lorazepam 10/23/14 #90  OK refill?

## 2014-11-28 NOTE — Telephone Encounter (Signed)
Medication refilled per protocol. 

## 2014-11-28 NOTE — Telephone Encounter (Signed)
ok 

## 2014-11-28 NOTE — Progress Notes (Signed)
Subjective:    Patient ID: Evan [name], male    DOB: 1928/01/22, 81 y.o.   MRN: [id number]  HPI  Patient has a rash on his right arm in the antecubital fossa. It is erythematous with numerous small yellow vesicles consistent with a contact dermatitis. It extends from just above the right antecubital fossa all the way down to the mid forearm. It is extremely itchy. He continues to scratch at it and there are numerous excoriations. He also has a cold sore on his lower lip. Past Medical History  Diagnosis Date  . CAD (coronary artery disease)   . BPH (benign prostatic hyperplasia)   . Arthritis   . Asthma   . GERD (gastroesophageal reflux disease)   . Hypertension   . Anxiety   . History of ASCVD     MULTIVESSEL  . 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 Hyperlipidemia   . DJD (degenerative joint disease)   . Hemochromatosis     Possible, elevated iron stores, hook-like osteophytes on hand films, normal LFTs   Past Surgical History  Procedure Laterality Date  . Cholecystectomy    . Rotator cuff repair    . Knee surgery    . Coronary artery bypass graft    . Descending aortic aneurysm repair w/ stent      DE stent ostium into the right radial free graft at OM-, 02-2007  . Esophagogastroduodenoscopy N/A 11/09/2014    Procedure: ESOPHAGOGASTRODUODENOSCOPY (EGD);  Surgeon: [name], MD;  Location: [facility] ENDOSCOPY;  Service: Endoscopy;  Laterality: N/A;   Current Outpatient Prescriptions on File Prior to Visit  Medication Sig Dispense Refill  . atenolol (TENORMIN) 50 MG tablet TAKE 1 TABLET DAILY. 90 tablet 0  . Ca Carbonate-Mag Hydroxide (ROLAIDS) 550-110 MG CHEW Chew 1 tablet by mouth daily as needed (for heartburn).    . clopidogrel (PLAVIX) 75 MG tablet Do not resume taking  this medicine until Friday, August 12. Then resume taking on a daily basis 90 tablet 3  . CRESTOR 5 MG tablet TAKE ONE (1) TABLET BY MOUTH EVERY DAY 30 tablet 10  . diclofenac  sodium (VOLTAREN) 1 % GEL Apply 2 g topically daily as needed (for pain in knees and feet).    Marland Kitchen FLUoxetine (PROZAC) 20 MG tablet Take 1 tablet (20 mg total) by mouth daily. 30 tablet 3  . folic acid (FOLVITE) 1 MG tablet Take 1 mg by mouth daily.    . hydrOXYzine (ATARAX/VISTARIL) 25 MG tablet Take 1 tablet (25 mg total) by mouth 3 (three) times daily as needed for itching. 30 tablet 0  . LORazepam (ATIVAN) 1 MG tablet TAKE ONE (1) TABLET BY MOUTH 3 TIMES DAILY. TAKE AT 8AM, 12 NOON, AND4PM. 90 tablet 0  . Melatonin 5 MG TABS Take 5 mg by mouth at bedtime.    . methotrexate (RHEUMATREX) 2.5 MG tablet Take 20 mg by mouth every Monday.     . nitroGLYCERIN (NITROSTAT) 0.4 MG SL tablet Place 1 tablet (0.4 mg total) under the tongue every 5 (five) minutes as needed for chest pain. 30 tablet 3  . pantoprazole (PROTONIX) 40 MG tablet Take 1 tablet (40 mg total) by mouth daily. 10 tablet 0  . pantoprazole (PROTONIX) 40 MG tablet Take 1 tablet (40 mg total) by mouth daily. 30 tablet 3  . predniSONE (DELTASONE) 5 MG tablet Take 5 mg by mouth daily with breakfast.    . saccharomyces  boulardii (FLORASTOR) 250 MG capsule Take 1 capsule (250 mg total) by mouth 2 (two) times daily. 60 capsule 0  . tamsulosin (FLOMAX) 0.4 MG CAPS capsule TAKE 1 CAPSULE EVERY DAY 90 capsule 3  . traZODone (DESYREL) 50 MG tablet Take 1 tablet (50 mg total) by mouth at bedtime as needed for sleep. (Patient taking differently: Take 50 mg by mouth at bedtime. ) 30 tablet 3   No current facility-administered medications on file prior to visit.   Allergies  Allergen Reactions  . Feldene [Piroxicam] Other (See Comments)    REACTION: Blisters  . Statins Other (See Comments)    Myalgias  . Doxycycline Other (See Comments)    REACTION:  unknown  . Tequin Anxiety  . Valium Other (See Comments)    REACTION:  unknown   Social History   Social History  . Marital Status: Married    Spouse Name: N/A  . Number of Children: N/A  .  Years of Education: N/A   Occupational History  . Not on file.   Social History Main Topics  . Smoking status: Never Smoker   . Smokeless tobacco: Former Systems developer  . Alcohol Use: No  . Drug Use: No  . Sexual Activity: Not on file   Other Topics Concern  . Not on file   Social History Narrative     Review of Systems  All other systems reviewed and are negative.      Objective:   Physical Exam  Cardiovascular: Normal rate, regular rhythm and normal heart sounds.   Pulmonary/Chest: Effort normal and breath sounds normal.  Skin: Rash noted. There is erythema.  Vitals reviewed.         Assessment & Plan:  Contact dermatitis - Plan: fluocinonide-emollient (LIDEX-E) 0.05 % cream  Cold sore - Plan: valACYclovir (VALTREX) 1000 MG tablet  Begin Lidex cream twice daily for 1 week for the contact dermatitis. Use Valtrex 2 g by mouth twice a day for 1 day for a cold sore

## 2014-11-28 NOTE — Telephone Encounter (Signed)
Pharmacy does not have Lidex-E emollient cream.  Has called around and unable to find.  Wants provider OK to use plain Lidex cream.  Per Dr Dennard Schaumann that is fine.  Pharmacy given OK to substitute

## 2014-12-02 ENCOUNTER — Ambulatory Visit: Payer: Commercial Managed Care - HMO | Admitting: Family Medicine

## 2014-12-03 ENCOUNTER — Telehealth: Payer: Self-pay | Admitting: Family Medicine

## 2014-12-03 ENCOUNTER — Other Ambulatory Visit: Payer: Self-pay | Admitting: Family Medicine

## 2014-12-03 NOTE — Telephone Encounter (Signed)
Medication refilled per protocol. 

## 2014-12-03 NOTE — Telephone Encounter (Signed)
[facility]  Patient is calling to get rx for his fluocinonide to be done asap, he is going to pharmacy this morning  [phone]

## 2014-12-03 NOTE — Telephone Encounter (Signed)
This was refilled 11/28/2014 per chart

## 2014-12-17 ENCOUNTER — Telehealth: Payer: Self-pay | Admitting: Interventional Cardiology

## 2014-12-17 NOTE — Telephone Encounter (Signed)
Returned call.  Patient wanted to make sure he was on Plavix.  He wanted to know what the generic name for Plavix.  Advised that it is clopidrogrel.  Patient said he was taking this.

## 2014-12-17 NOTE — Telephone Encounter (Signed)
New message     Pt want to know if he is on plavix?

## 2014-12-19 ENCOUNTER — Telehealth: Payer: Self-pay | Admitting: *Deleted

## 2014-12-19 DIAGNOSIS — T18128A Food in esophagus causing other injury, initial encounter: Secondary | ICD-10-CM | POA: Diagnosis not present

## 2014-12-19 NOTE — Telephone Encounter (Signed)
Submitted [employer] referral thru [facility] connect for authorization to Evan [name] with authorization [id number]  Requesting provider: Flonnie Hailstone  Treating provider: Bea Graff  Number of visits:6  Start Date: 12/18/14  End Date: 06/16/15  Dx:787.20-Dysphagia NOS

## 2014-12-24 ENCOUNTER — Telehealth: Payer: Self-pay | Admitting: *Deleted

## 2014-12-24 NOTE — Telephone Encounter (Signed)
-----   Message from Alisia Ferrari sent at 12/23/2014 10:27 AM EDT ----- Gabriel Cirri,  Will you call Mr. Giangregorio about his referral to Dr. Dossie Der (?) [phone]

## 2014-12-24 NOTE — Telephone Encounter (Signed)
Called yesterday left message to return call, called again this morning left  message

## 2014-12-25 ENCOUNTER — Telehealth: Payer: Self-pay | Admitting: Interventional Cardiology

## 2014-12-25 ENCOUNTER — Other Ambulatory Visit: Payer: Self-pay | Admitting: Family Medicine

## 2014-12-25 NOTE — Telephone Encounter (Signed)
Courtney called from Dr. Elenor Quinones office, faxed request 12-19-14 for pt to hold Plavix 7 days prior to procedure 01-05-15, checking on status

## 2014-12-25 NOTE — Telephone Encounter (Signed)
Spoke with Evan [name] at Dr. Amedeo Plenty office and informed her that I faxed over clearance yesterday at about 4:30pm. Evan [name] states that it probably just hasn't gotten to her yet and that she will call if she does not receive it by the end of the day.

## 2014-12-25 NOTE — Telephone Encounter (Signed)
Pt called back stating he is needing a [employer] referral to see Dr. Dossie Der Rheumologist at [facility], pt states that Dr. Trudie Reed is moving and needed to have a referral placed for [facility]. [employer] referral placed  [facility] authorization pending

## 2014-12-29 ENCOUNTER — Telehealth: Payer: Self-pay | Admitting: *Deleted

## 2014-12-29 NOTE — Telephone Encounter (Signed)
Submitted [employer] referral thru acuity connect for authorization to Dr. 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

## 2014-12-31 DIAGNOSIS — M2550 Pain in unspecified joint: Secondary | ICD-10-CM | POA: Diagnosis not present

## 2014-12-31 DIAGNOSIS — M353 Polymyalgia rheumatica: Secondary | ICD-10-CM | POA: Diagnosis not present

## 2014-12-31 DIAGNOSIS — M810 Age-related osteoporosis without current pathological fracture: Secondary | ICD-10-CM | POA: Diagnosis not present

## 2014-12-31 DIAGNOSIS — M159 Polyosteoarthritis, unspecified: Secondary | ICD-10-CM | POA: Diagnosis not present

## 2014-12-31 DIAGNOSIS — Z79899 Other long term (current) drug therapy: Secondary | ICD-10-CM | POA: Diagnosis not present

## 2015-01-05 DIAGNOSIS — R1310 Dysphagia, unspecified: Secondary | ICD-10-CM | POA: Diagnosis not present

## 2015-01-05 DIAGNOSIS — K222 Esophageal obstruction: Secondary | ICD-10-CM | POA: Diagnosis not present

## 2015-01-05 DIAGNOSIS — K2210 Ulcer of esophagus without bleeding: Secondary | ICD-10-CM | POA: Diagnosis not present

## 2015-01-09 ENCOUNTER — Other Ambulatory Visit: Payer: Self-pay | Admitting: Family Medicine

## 2015-01-09 NOTE — Telephone Encounter (Signed)
Medication called to pharmacy. 

## 2015-01-09 NOTE — Telephone Encounter (Signed)
ok 

## 2015-01-09 NOTE — Telephone Encounter (Signed)
Ok to refill??  Last office visit/ refill 11/28/2014.

## 2015-01-16 ENCOUNTER — Encounter: Payer: Self-pay | Admitting: Family Medicine

## 2015-01-16 ENCOUNTER — Ambulatory Visit (INDEPENDENT_AMBULATORY_CARE_PROVIDER_SITE_OTHER): Payer: Commercial Managed Care - HMO | Admitting: Family Medicine

## 2015-01-16 VITALS — BP 110/60 | HR 60 | Temp 97.7°F | Resp 18 | Wt 179.0 lb

## 2015-01-16 DIAGNOSIS — F411 Generalized anxiety disorder: Secondary | ICD-10-CM

## 2015-01-16 DIAGNOSIS — G4700 Insomnia, unspecified: Secondary | ICD-10-CM | POA: Diagnosis not present

## 2015-01-16 DIAGNOSIS — Z23 Encounter for immunization: Secondary | ICD-10-CM | POA: Diagnosis not present

## 2015-01-16 MED ORDER — TRAZODONE HCL 50 MG PO TABS: 100.0000 mg | ORAL_TABLET | Freq: Every evening | ORAL | Status: DC | PRN

## 2015-01-16 NOTE — Progress Notes (Signed)
Subjective:    Patient ID: Evan [name], male    DOB: 1928-01-27, 81 y.o.   MRN: [id number]  HPI Patient has several questions regarding his medication. He was initially taking pantoprazole for acid reflux and dysphagia. Recently a gastroenterologist performed an esophageal dilatation of an esophageal stricture.  They subsequently started the patient on omeprazole. However he is taking Plavix and his history of coronary artery disease. Concomitant use of omeprazole and Plavix are contraindicated. At the present time the patient has been taking omeprazole and pantoprazole. He also has 2 bottles of Crestor in his medicine bag. He has 3 bottles of  Lab except his medicine bag. He is asking for something additional to help him sleep. He is using lorazepam 3 times a day. He is currently taking trazodone 50 mg by mouth daily at bedtime to try to help him sleep. Past Medical History  Diagnosis Date  . CAD (coronary artery disease)   . BPH (benign prostatic hyperplasia)   . Arthritis   . Asthma   . GERD (gastroesophageal reflux disease)   . Hypertension   . Anxiety   . History of ASCVD     MULTIVESSEL  . Anginal chest pain at rest [facility])     Chronic non, controlled on Lorazepam  . Hyperlipidemia   . DJD (degenerative joint disease)   . Hemochromatosis     Possible, elevated iron stores, hook-like osteophytes on hand films, normal LFTs   Past Surgical History  Procedure Laterality Date  . Cholecystectomy    . Rotator cuff repair    . Knee surgery    . Coronary artery bypass graft    . Descending aortic aneurysm repair w/ stent      DE stent ostium into the right radial free graft at OM-, 02-2007  . Esophagogastroduodenoscopy N/A 11/09/2014    Procedure: ESOPHAGOGASTRODUODENOSCOPY (EGD);  Surgeon: [name], MD;  Location: [facility] ENDOSCOPY;  Service: Endoscopy;  Laterality: N/A;   Current Outpatient Prescriptions on File Prior to Visit  Medication Sig Dispense Refill  . atenolol (TENORMIN) 50  MG tablet TAKE 1 TABLET DAILY. 90 tablet 0  . clopidogrel (PLAVIX) 75 MG tablet Do not resume taking  this medicine until Friday, August 12. Then resume taking on a daily basis 90 tablet 3  . CRESTOR 5 MG tablet TAKE ONE (1) TABLET BY MOUTH EVERY DAY 30 tablet 10  . folic acid (FOLVITE) 1 MG tablet Take 1 mg by mouth daily.    Marland Kitchen LORazepam (ATIVAN) 1 MG tablet TAKE ONE (1) TABLET BY MOUTH 3 TIMES DAILY. TAKE AT 8AM, 12 NOON, AND4PM. 90 tablet 0  . methotrexate (RHEUMATREX) 2.5 MG tablet Take 20 mg by mouth every Monday.     . nitroGLYCERIN (NITROSTAT) 0.4 MG SL tablet Place 1 tablet (0.4 mg total) under the tongue every 5 (five) minutes as needed for chest pain. 30 tablet 3  . predniSONE (DELTASONE) 5 MG tablet Take 5 mg by mouth daily with breakfast.    . tamsulosin (FLOMAX) 0.4 MG CAPS capsule TAKE 1 CAPSULE EVERY DAY 90 capsule 3  . Ca Carbonate-Mag Hydroxide (ROLAIDS) 550-110 MG CHEW Chew 1 tablet by mouth daily as needed (for heartburn).    . diclofenac sodium (VOLTAREN) 1 % GEL Apply 2 g topically daily as needed (for pain in knees and feet).    . fluocinonide cream (LIDEX) 0.05 % APPLY TO AFFECTED AREAS ON SKIN TWICE DAILY (Patient not taking: Reported on 01/16/2015) 60 g 1  .  FLUoxetine (PROZAC) 20 MG capsule TAKE ONE CAPSULE BY MOUTH DAILY (Patient not taking: Reported on 01/16/2015) 30 capsule 5  . hydrOXYzine (ATARAX/VISTARIL) 25 MG tablet TAKE ONE (1) TABLET BY MOUTH 3 TIMES DAILY AS NEEDED FOR ITCHING (Patient not taking: Reported on 01/16/2015) 30 tablet 0  . Melatonin 5 MG TABS Take 5 mg by mouth at bedtime.    . pantoprazole (PROTONIX) 40 MG tablet Take 1 tablet (40 mg total) by mouth daily. (Patient not taking: Reported on 01/16/2015) 10 tablet 0  . pantoprazole (PROTONIX) 40 MG tablet Take 1 tablet (40 mg total) by mouth daily. (Patient not taking: Reported on 01/16/2015) 30 tablet 3  . saccharomyces boulardii (FLORASTOR) 250 MG capsule Take 1 capsule (250 mg total) by mouth 2 (two)  times daily. (Patient not taking: Reported on 01/16/2015) 60 capsule 0  . valACYclovir (VALTREX) 1000 MG tablet TAKE TWO (2) TABLETS BY MOUTH 2 TIMES DAILY 4 tablet 3   No current facility-administered medications on file prior to visit.   Allergies  Allergen Reactions  . Feldene [Piroxicam] Other (See Comments)    REACTION: Blisters  . Statins Other (See Comments)    Myalgias  . Doxycycline Other (See Comments)    REACTION:  unknown  . Tequin Anxiety  . Valium Other (See Comments)    REACTION:  unknown   Social History   Social History  . Marital Status: Married    Spouse Name: N/A  . Number of Children: N/A  . Years of Education: N/A   Occupational History  . Not on file.   Social History Main Topics  . Smoking status: Never Smoker   . Smokeless tobacco: Former Systems developer  . Alcohol Use: No  . Drug Use: No  . Sexual Activity: Not on file   Other Topics Concern  . Not on file   Social History Narrative      Review of Systems  All other systems reviewed and are negative.      Objective:   Physical Exam  Constitutional: He appears well-developed and well-nourished.  Cardiovascular: Normal rate, regular rhythm and normal heart sounds.   No murmur heard. Pulmonary/Chest: Effort normal and breath sounds normal. No respiratory distress. He has no wheezes. He has no rales.  Abdominal: Soft. Bowel sounds are normal. He exhibits no distension. There is no tenderness. There is no rebound and no guarding.  Musculoskeletal: He exhibits no edema.  Vitals reviewed.         Assessment & Plan:  Need for prophylactic vaccination and inoculation against influenza - Plan: Flu Vaccine QUAD 36+ mos IM  Insomnia - Plan: traZODone (DESYREL) 50 MG tablet  Generalized anxiety disorder - Plan: traZODone (DESYREL) 50 MG tablet  Increase trazodone to 100 mg by mouth daily at bedtime. I will avoid any further controlled substances given his age. I recommended discontinuation of  omeprazole. Given his use of Plavix, he should only be on pantoprazole. I consolidated CRESTOR pills into one bottle. I consolidated all of his Plavix pills and to one bottle. I discussed polypharmacy with the patient and I informed him that he needs to keep his medications in a pillbox to avoid duplication

## 2015-01-19 ENCOUNTER — Other Ambulatory Visit: Payer: Self-pay | Admitting: Family Medicine

## 2015-01-19 ENCOUNTER — Encounter: Payer: Self-pay | Admitting: Family Medicine

## 2015-01-19 DIAGNOSIS — K2210 Ulcer of esophagus without bleeding: Secondary | ICD-10-CM | POA: Insufficient documentation

## 2015-01-19 NOTE — Telephone Encounter (Signed)
Refill appropriate and filled per protocol. 

## 2015-01-20 ENCOUNTER — Telehealth: Payer: Self-pay | Admitting: Family Medicine

## 2015-01-20 NOTE — Telephone Encounter (Signed)
-----   Message from [name], MD sent at 01/19/2015  7:25 AM EDT ----- Have patient increase pantoprazole to 40 mg pobid indefinitely.

## 2015-01-20 NOTE — Telephone Encounter (Signed)
Tried to call no answer and no vm 

## 2015-01-21 MED ORDER — PANTOPRAZOLE SODIUM 40 MG PO TBEC: 40.0000 mg | DELAYED_RELEASE_TABLET | Freq: Two times a day (BID) | ORAL | Status: DC

## 2015-01-21 NOTE — Telephone Encounter (Signed)
Spoke to pt's son and explained to him about the Protonix and sent in new rx for BID dosing and to stop the omeprazole.

## 2015-01-21 NOTE — Telephone Encounter (Signed)
Line busy

## 2015-01-21 NOTE — Telephone Encounter (Signed)
Called and lmovm with Margaretha Glassing, pt's son, to please have pt return call

## 2015-02-03 ENCOUNTER — Ambulatory Visit (INDEPENDENT_AMBULATORY_CARE_PROVIDER_SITE_OTHER): Payer: Commercial Managed Care - HMO | Admitting: Family Medicine

## 2015-02-03 ENCOUNTER — Encounter: Payer: Self-pay | Admitting: Family Medicine

## 2015-02-03 VITALS — BP 150/60 | HR 58 | Temp 98.0°F | Resp 16 | Ht 68.0 in | Wt 177.0 lb

## 2015-02-03 DIAGNOSIS — R0789 Other chest pain: Secondary | ICD-10-CM

## 2015-02-03 NOTE — Progress Notes (Signed)
Subjective:    Patient ID: Evan [name], male    DOB: [birth year], 81 y.o.   MRN: [id number]  HPI Patient presents with 2 days of vague chest pain. Pain is located in the left superior chest. It radiates into his left axilla and behind to the left subscapular area. It also radiates on top of his left shoulder. It aches and throbs at night. However he denies any symptoms during the day. He denies any exertional component.Marland Kitchen He denies any shortness of breath or worsening respiratory status. He denies any edema, paroxysmal nocturnal dyspnea, orthopnea. On examination today I'm able to reproduce the pain with palpation in the axilla. I can also reproduce the pain with empty can testing. He has palpable crepitus in his left shoulder. Past Medical History  Diagnosis Date  . CAD (coronary artery disease)   . BPH (benign prostatic hyperplasia)   . Arthritis   . Asthma   . GERD (gastroesophageal reflux disease)   . Hypertension   . Anxiety   . History of ASCVD     MULTIVESSEL  . Anginal chest pain at rest [facility])     Chronic non, controlled on Lorazepam  . Hyperlipidemia   . DJD (degenerative joint disease)   . Hemochromatosis     Possible, elevated iron stores, hook-like osteophytes on hand films, normal LFTs  . Esophageal ulcer without bleeding     bid ppi indefinitely   Past Surgical History  Procedure Laterality Date  . Cholecystectomy    . Rotator cuff repair    . Knee surgery    . Coronary artery bypass graft    . Descending aortic aneurysm repair w/ stent      DE stent ostium into the right radial free graft at OM-, 02-2007  . Esophagogastroduodenoscopy N/A 11/09/2014    Procedure: ESOPHAGOGASTRODUODENOSCOPY (EGD);  Surgeon: [name], MD;  Location: [facility] ENDOSCOPY;  Service: Endoscopy;  Laterality: N/A;   Current Outpatient Prescriptions on File Prior to Visit  Medication Sig Dispense Refill  . atenolol (TENORMIN) 50 MG tablet TAKE 1 TABLET DAILY. 90 tablet 0  . clopidogrel (PLAVIX)  75 MG tablet Do not resume taking  this medicine until Friday, August 12. Then resume taking on a daily basis 90 tablet 3  . CRESTOR 5 MG tablet TAKE ONE (1) TABLET BY MOUTH EVERY DAY 30 tablet 10  . diclofenac sodium (VOLTAREN) 1 % GEL Apply 2 g topically daily as needed (for pain in knees and feet).    Marland Kitchen FLUoxetine (PROZAC) 20 MG capsule TAKE ONE CAPSULE BY MOUTH DAILY 30 capsule 5  . folic acid (FOLVITE) 1 MG tablet Take 1 mg by mouth daily.    Marland Kitchen LORazepam (ATIVAN) 1 MG tablet TAKE ONE (1) TABLET BY MOUTH 3 TIMES DAILY. TAKE AT 8AM, 12 NOON, AND4PM. 90 tablet 0  . Melatonin 5 MG TABS Take 5 mg by mouth at bedtime.    . methotrexate (RHEUMATREX) 2.5 MG tablet Take 20 mg by mouth every Monday.     . nitroGLYCERIN (NITROSTAT) 0.4 MG SL tablet Place 1 tablet (0.4 mg total) under the tongue every 5 (five) minutes as needed for chest pain. 30 tablet 3  . pantoprazole (PROTONIX) 40 MG tablet Take 1 tablet (40 mg total) by mouth 2 (two) times daily. 60 tablet 11  . predniSONE (DELTASONE) 5 MG tablet Take 5 mg by mouth daily with breakfast.    . tamsulosin (FLOMAX) 0.4 MG CAPS capsule TAKE 1 CAPSULE EVERY DAY 90 capsule  3  . traZODone (DESYREL) 50 MG tablet Take 2 tablets (100 mg total) by mouth at bedtime as needed for sleep. 60 tablet 3   No current facility-administered medications on file prior to visit.   Allergies  Allergen Reactions  . Feldene [Piroxicam] Other (See Comments)    REACTION: Blisters  . Statins Other (See Comments)    Myalgias  . Doxycycline Other (See Comments)    REACTION:  unknown  . Tequin Anxiety  . Valium Other (See Comments)    REACTION:  unknown   Social History   Social History  . Marital Status: Married    Spouse Name: N/A  . Number of Children: N/A  . Years of Education: N/A   Occupational History  . Not on file.   Social History Main Topics  . Smoking status: Never Smoker   . Smokeless tobacco: Former Systems developer  . Alcohol Use: No  . Drug Use: No  .  Sexual Activity: Not on file   Other Topics Concern  . Not on file   Social History Narrative     Review of Systems  All other systems reviewed and are negative.      Objective:   Physical Exam  Constitutional: He appears well-developed and well-nourished.  Cardiovascular: Normal rate, regular rhythm, normal heart sounds and intact distal pulses.  Exam reveals no gallop and no friction rub.   No murmur heard. Pulmonary/Chest: Effort normal and breath sounds normal. No respiratory distress. He has no wheezes. He has no rales. He exhibits no tenderness.  Musculoskeletal:       Left shoulder: He exhibits decreased range of motion, tenderness, crepitus and pain.  Vitals reviewed.         Assessment & Plan:  Other chest pain - Plan: EKG 12-Lead His chest pain is very atypical. It is located in the upper left chest near the shoulder joint. There is no exertional component. He denies angina or shortness of breath or dyspnea on exertion. The pain is reproducible with range of motion in the left shoulder. It is also reproducible with palpation into the left axilla. I believe the pain is likely secondary to shoulder arthritis. EKG today shows sinus bradycardia but no ischemia or infarction. There is no changes compared to an EKG from March 2016. He will try tramadol at night to help him sleep. If the pain intensifies we can proceed with cortisone injection left shoulder

## 2015-02-04 ENCOUNTER — Ambulatory Visit (INDEPENDENT_AMBULATORY_CARE_PROVIDER_SITE_OTHER): Payer: Commercial Managed Care - HMO | Admitting: Physician Assistant

## 2015-02-04 ENCOUNTER — Encounter: Payer: Self-pay | Admitting: Physician Assistant

## 2015-02-04 VITALS — BP 104/56 | HR 76 | Temp 99.0°F | Resp 18 | Wt 179.0 lb

## 2015-02-04 DIAGNOSIS — M1990 Unspecified osteoarthritis, unspecified site: Secondary | ICD-10-CM | POA: Diagnosis not present

## 2015-02-04 MED ORDER — HYDROCODONE-ACETAMINOPHEN 5-325 MG PO TABS: 1.0000 | ORAL_TABLET | Freq: Four times a day (QID) | ORAL | Status: DC | PRN

## 2015-02-04 MED ORDER — NITROGLYCERIN 0.4 MG SL SUBL: 0.4000 mg | SUBLINGUAL_TABLET | SUBLINGUAL | Status: DC | PRN

## 2015-02-04 NOTE — Progress Notes (Signed)
Patient ID: Evan [name] MRN: [id number], DOB: [birth year], 81 y.o. Date of Encounter: 02/04/2015, 10:33 AM    Chief Complaint:  Chief Complaint  Patient presents with  . severe arthritis    need refill pain meds, and nitroglycerin     HPI: 81 y.o. year old white male presents with above.  He says that he is having arthritis pain all over. Says he has arthritis pain in his toes and the middle of his feet. Says it was in his left shoulder now it's in his right shoulder and neck. Says it's in his fingers toes feet hips. When he mentions the pain in the shoulder and neck area he makes reference to seeing [name] recently and says that he told him that if that pain did not get better in several days that he could return for cortisone injection. I then reviewed Dr. Samella Parr note which was actually just yesterday. I discussed that with patient and he says yes he was just here yesterday but now this pain has gotten even worse and he has to have something more to use for this pain. Reviewed that at the visit yesterday with [name] he gave him tramadol/Ultram. Patient states that this isn't helping at all.  I then also reviewed that he is on methotrexate according to his medication list. Asked who is prescribing this. He says that actually the methotrexate recently was increased from taking 8 pills once a week on Mondays to taking 10 pills once a week on Mondays. He says that he has a follow-up appointment at the rheumatologist next week.  He is requesting to have her some more ear hydrocodone or oxycodone-- he is not sure which one it was--- says that he last had a prescription of this from his dentist 5 or 6 months ago for #18 and that these just recently ran out and he needs to have some more of this available to use when the pain is severe--to hold him over until his appt with Rheumatologist next week.     Home Meds:   Outpatient Prescriptions Prior to Visit  Medication Sig  Dispense Refill  . atenolol (TENORMIN) 50 MG tablet TAKE 1 TABLET DAILY. 90 tablet 0  . clopidogrel (PLAVIX) 75 MG tablet Do not resume taking  this medicine until Friday, August 12. Then resume taking on a daily basis 90 tablet 3  . CRESTOR 5 MG tablet TAKE ONE (1) TABLET BY MOUTH EVERY DAY 30 tablet 10  . diclofenac sodium (VOLTAREN) 1 % GEL Apply 2 g topically daily as needed (for pain in knees and feet).    . fluocinonide cream (LIDEX) 0.05 %     . FLUoxetine (PROZAC) 20 MG capsule TAKE ONE CAPSULE BY MOUTH DAILY 30 capsule 5  . folic acid (FOLVITE) 1 MG tablet Take 1 mg by mouth daily.    Marland Kitchen LORazepam (ATIVAN) 1 MG tablet TAKE ONE (1) TABLET BY MOUTH 3 TIMES DAILY. TAKE AT 8AM, 12 NOON, AND4PM. 90 tablet 0  . Melatonin 5 MG TABS Take 5 mg by mouth at bedtime.    . methotrexate (RHEUMATREX) 2.5 MG tablet Take 20 mg by mouth every Monday.     . pantoprazole (PROTONIX) 40 MG tablet Take 1 tablet (40 mg total) by mouth 2 (two) times daily. 60 tablet 11  . predniSONE (DELTASONE) 5 MG tablet Take 5 mg by mouth daily with breakfast.    . tamsulosin (FLOMAX) 0.4 MG CAPS capsule TAKE 1 CAPSULE EVERY  DAY 90 capsule 3  . traMADol (ULTRAM) 50 MG tablet Take 50 mg by mouth every 6 (six) hours as needed.    . traZODone (DESYREL) 50 MG tablet Take 2 tablets (100 mg total) by mouth at bedtime as needed for sleep. 60 tablet 3  . nitroGLYCERIN (NITROSTAT) 0.4 MG SL tablet Place 1 tablet (0.4 mg total) under the tongue every 5 (five) minutes as needed for chest pain. 30 tablet 3   No facility-administered medications prior to visit.    Allergies:  Allergies  Allergen Reactions  . Feldene [Piroxicam] Other (See Comments)    REACTION: Blisters  . Statins Other (See Comments)    Myalgias  . Doxycycline Other (See Comments)    REACTION:  unknown  . Tequin Anxiety  . Valium Other (See Comments)    REACTION:  unknown      Review of Systems: See HPI for pertinent ROS. All other ROS negative.     Physical Exam: Blood pressure 104/56, pulse 76, temperature 99 F (37.2 C), temperature source Oral, resp. rate 18, weight 179 lb (81.194 kg)., Body mass index is 27.22 kg/(m^2). General:  WNWD WM. Appears in no acute distress. Neck: Supple. No thyromegaly. No lymphadenopathy. Lungs: Clear bilaterally to auscultation without wheezes, rales, or rhonchi. Breathing is unlabored. Heart: Regular rhythm. No murmurs, rubs, or gallops. Msk:  Strength and tone normal for age. Extremities/Skin: Warm and dry.  Neuro: Alert and oriented X 3. Moves all extremities spontaneously. Gait is normal. CNII-XII grossly in tact. Psych:  Responds to questions appropriately with a normal affect.     ASSESSMENT AND PLAN:  81 y.o. year old male with  1. Arthritis Discussed with him to take the hydrocodone sparingly only when pain is severe. Follow-up with rheumatologist as scheduled next week. - HYDROcodone-acetaminophen (NORCO/VICODIN) 5-325 MG tablet; Take 1 tablet by mouth every 6 (six) hours as needed.  Dispense: 30 tablet; Refill: 0   Signed, [address] [place], [place], [facility] 02/04/2015 10:33 AM

## 2015-02-06 ENCOUNTER — Ambulatory Visit: Payer: Self-pay | Admitting: Family Medicine

## 2015-02-09 DIAGNOSIS — T18128A Food in esophagus causing other injury, initial encounter: Secondary | ICD-10-CM | POA: Diagnosis not present

## 2015-02-09 DIAGNOSIS — K2210 Ulcer of esophagus without bleeding: Secondary | ICD-10-CM | POA: Diagnosis not present

## 2015-02-11 DIAGNOSIS — M19041 Primary osteoarthritis, right hand: Secondary | ICD-10-CM | POA: Diagnosis not present

## 2015-02-11 DIAGNOSIS — Z79899 Other long term (current) drug therapy: Secondary | ICD-10-CM | POA: Diagnosis not present

## 2015-02-11 DIAGNOSIS — M2550 Pain in unspecified joint: Secondary | ICD-10-CM | POA: Diagnosis not present

## 2015-02-11 DIAGNOSIS — M25512 Pain in left shoulder: Secondary | ICD-10-CM | POA: Diagnosis not present

## 2015-02-11 DIAGNOSIS — M19042 Primary osteoarthritis, left hand: Secondary | ICD-10-CM | POA: Diagnosis not present

## 2015-02-11 DIAGNOSIS — M810 Age-related osteoporosis without current pathological fracture: Secondary | ICD-10-CM | POA: Diagnosis not present

## 2015-02-11 DIAGNOSIS — M159 Polyosteoarthritis, unspecified: Secondary | ICD-10-CM | POA: Diagnosis not present

## 2015-02-11 DIAGNOSIS — M25511 Pain in right shoulder: Secondary | ICD-10-CM | POA: Diagnosis not present

## 2015-02-11 DIAGNOSIS — M353 Polymyalgia rheumatica: Secondary | ICD-10-CM | POA: Diagnosis not present

## 2015-02-16 ENCOUNTER — Other Ambulatory Visit: Payer: Self-pay | Admitting: Family Medicine

## 2015-02-17 NOTE — Telephone Encounter (Signed)
?   OK to Refill  - LOV - 02/04/15 - LRF - 01/09/15

## 2015-02-17 NOTE — Telephone Encounter (Signed)
okay

## 2015-03-06 ENCOUNTER — Ambulatory Visit (INDEPENDENT_AMBULATORY_CARE_PROVIDER_SITE_OTHER): Payer: Commercial Managed Care - HMO | Admitting: Family Medicine

## 2015-03-06 ENCOUNTER — Encounter: Payer: Self-pay | Admitting: Family Medicine

## 2015-03-06 VITALS — BP 110/58 | HR 60 | Temp 98.1°F | Resp 18 | Ht 68.0 in | Wt 182.0 lb

## 2015-03-06 DIAGNOSIS — J069 Acute upper respiratory infection, unspecified: Secondary | ICD-10-CM | POA: Diagnosis not present

## 2015-03-06 MED ORDER — SALINE SPRAY 0.65 % NA SOLN: 1.0000 | NASAL | Status: DC | PRN

## 2015-03-06 MED ORDER — GUAIFENESIN-DM 100-10 MG/5ML PO SYRP: 5.0000 mL | ORAL_SOLUTION | ORAL | Status: DC | PRN

## 2015-03-06 MED ORDER — AZITHROMYCIN 250 MG PO TABS: ORAL_TABLET | ORAL | Status: DC

## 2015-03-06 NOTE — Progress Notes (Signed)
Patient ID: Evan [name], male   DOB: November 10, 1927, 82 y.o.   MRN: [id number]   Subjective:    Patient ID: Evan [name], male    DOB: January 02, 1928, 81 y.o.   MRN: [id number]  Patient presents for Illness  Pt here with cough with production thick sputum, sinus drainage for past 3 days, no fever, but has felt bad. No SOB, has not taken any OTC cough meds. +sick contact with family members. Lives alone. On prednisone and MTX due to RA.     Review Of Systems:  GEN- denies fatigue, fever, weight loss,weakness, recent illness HEENT- denies eye drainage, change in vision, nasal discharge, CVS- denies chest pain, palpitations RESP- denies SOB, +cough, wheeze ABD- denies N/V, change in stools, abd pain GU- denies dysuria, hematuria, dribbling, incontinence MSK- + joint pain, muscle aches, injury Neuro- denies headache, dizziness, syncope, seizure activity       Objective:    BP 110/58 mmHg  Pulse 60  Temp(Src) 98.1 F (36.7 C) (Oral)  Resp 18  Ht '5\' 8"'$  (1.727 m)  Wt 182 lb (82.555 kg)  BMI 27.68 kg/m2  SpO2 97% GEN- NAD, alert and oriented x3 HEENT- PERRL, EOMI, non injected sclera, pink conjunctiva, MMM, oropharynx mild injection, TM obscurred by wax no effusion, no maxillary sinus tenderness,  + Nasal drainage  Neck- Supple, no LAD CVS- RRR, no murmur RESP-mild rhonchi right base, normal WOB, no wheeze EXT- No edema Pulses- Radial 2+         Assessment & Plan:      Problem List Items Addressed This Visit    None    Visit Diagnoses    Acute URI    -  Primary    URi with sinus drainage, chest cold, based on age, and immunocompromised, given zpak, robitussn DM, nasal saline, on prednisone already    Relevant Medications    azithromycin (ZITHROMAX) 250 MG tablet       Note: This dictation was prepared with Dragon dictation along with smaller phrase technology. Any transcriptional errors that result from this process are unintentional.

## 2015-03-06 NOTE — Patient Instructions (Signed)
Take robitussum DM Take antibiotics Use salt water spray in your nose F/U as needed

## 2015-03-09 ENCOUNTER — Encounter: Payer: Self-pay | Admitting: Family Medicine

## 2015-04-07 ENCOUNTER — Other Ambulatory Visit: Payer: Self-pay | Admitting: Interventional Cardiology

## 2015-04-15 ENCOUNTER — Encounter: Payer: Self-pay | Admitting: Physician Assistant

## 2015-04-15 ENCOUNTER — Ambulatory Visit (INDEPENDENT_AMBULATORY_CARE_PROVIDER_SITE_OTHER): Payer: Commercial Managed Care - HMO | Admitting: Physician Assistant

## 2015-04-15 VITALS — BP 106/60 | HR 54 | Temp 98.0°F | Resp 18 | Wt 183.0 lb

## 2015-04-15 DIAGNOSIS — J988 Other specified respiratory disorders: Secondary | ICD-10-CM

## 2015-04-15 DIAGNOSIS — B9689 Other specified bacterial agents as the cause of diseases classified elsewhere: Secondary | ICD-10-CM

## 2015-04-15 MED ORDER — AZITHROMYCIN 250 MG PO TABS: ORAL_TABLET | ORAL | Status: DC

## 2015-04-15 NOTE — Progress Notes (Signed)
Patient ID: Evan [name] MRN: [id number], DOB: Oct 29, 1927, 82 y.o. Date of Encounter: 04/15/2015, 12:17 PM    Chief Complaint:  Chief Complaint  Patient presents with  . sick x 2-3 weeks    cough/chest congestion     HPI: 82 y.o. year old white male here with his son. Reports above symptoms. Says he feels congested in his head but nothing comes out. Says feels a lot of drainage and phlegm in his throat. Also chest congestion and cough. Has had no fevers/chills. No sore throat.      Home Meds:   Outpatient Prescriptions Prior to Visit  Medication Sig Dispense Refill  . atenolol (TENORMIN) 50 MG tablet TAKE 1 TABLET DAILY. 90 tablet 0  . clopidogrel (PLAVIX) 75 MG tablet Do not resume taking  this medicine until Friday, August 12. Then resume taking on a daily basis 90 tablet 3  . diclofenac sodium (VOLTAREN) 1 % GEL Apply 2 g topically daily as needed (for pain in knees and feet).    Marland Kitchen FLUoxetine (PROZAC) 20 MG capsule TAKE ONE CAPSULE BY MOUTH DAILY 30 capsule 5  . folic acid (FOLVITE) 1 MG tablet Take 1 mg by mouth daily.    Marland Kitchen HYDROcodone-acetaminophen (NORCO/VICODIN) 5-325 MG tablet Take 1 tablet by mouth every 6 (six) hours as needed. 30 tablet 0  . LORazepam (ATIVAN) 1 MG tablet TAKE ONE (1) TABLET BY MOUTH 3 TIMES DAILY. TAKE AT 8AM, 12 NOON, AND4PM. 90 tablet 2  . nitroGLYCERIN (NITROSTAT) 0.4 MG SL tablet Place 1 tablet (0.4 mg total) under the tongue every 5 (five) minutes as needed for chest pain. 30 tablet 3  . pantoprazole (PROTONIX) 40 MG tablet Take 1 tablet (40 mg total) by mouth 2 (two) times daily. 60 tablet 11  . predniSONE (DELTASONE) 5 MG tablet Take 5 mg by mouth daily with breakfast.    . rosuvastatin (CRESTOR) 5 MG tablet TAKE 1 TABLET EVERY DAY 90 tablet 0  . sodium chloride (OCEAN) 0.65 % SOLN nasal spray Place 1 spray into both nostrils as needed for congestion. 1 Bottle 0  . tamsulosin (FLOMAX) 0.4 MG CAPS capsule TAKE 1 CAPSULE EVERY DAY 90 capsule 3  .  azithromycin (ZITHROMAX) 250 MG tablet Take 2 tablets x 1 day, then 1 tab daily for 4 days (Patient not taking: Reported on 04/15/2015) 6 tablet 0  . fluocinonide cream (LIDEX) 0.05 % Reported on 04/15/2015    . guaiFENesin-dextromethorphan (ROBITUSSIN DM) 100-10 MG/5ML syrup Take 5 mLs by mouth every 4 (four) hours as needed for cough. (Patient not taking: Reported on 04/15/2015) 140 mL 0  . Melatonin 5 MG TABS Take 5 mg by mouth at bedtime. Reported on 04/15/2015    . methotrexate (RHEUMATREX) 2.5 MG tablet Take 20 mg by mouth every Monday. Reported on 04/15/2015    . traMADol (ULTRAM) 50 MG tablet Take 50 mg by mouth every 6 (six) hours as needed. Reported on 04/15/2015    . traZODone (DESYREL) 50 MG tablet Take 2 tablets (100 mg total) by mouth at bedtime as needed for sleep. (Patient not taking: Reported on 04/15/2015) 60 tablet 3   No facility-administered medications prior to visit.    Allergies:  Allergies  Allergen Reactions  . Feldene [Piroxicam] Other (See Comments)    REACTION: Blisters  . Statins Other (See Comments)    Myalgias  . Doxycycline Other (See Comments)    REACTION:  unknown  . Tequin Anxiety  . Valium Other (See  Comments)    REACTION:  unknown      Review of Systems: See HPI for pertinent ROS. All other ROS negative.    Physical Exam: Blood pressure 106/60, pulse 54, temperature 98 F (36.7 C), temperature source Oral, resp. rate 18, weight 183 lb (83.008 kg)., Body mass index is 27.83 kg/(m^2). General:  WNWD WM. Appears in no acute distress. HEENT: Normocephalic, atraumatic, eyes without discharge, sclera non-icteric, nares are without discharge. Bilateral auditory canals ---with cerumen impaction.  Oral cavity moist, posterior pharynx without exudate. Mild erythema of uvula and posterior pharynx. Neck: Supple. No thyromegaly. No lymphadenopathy. Lungs: Clear bilaterally to auscultation without wheezes, rales, or rhonchi. Breathing is unlabored. Heart: Regular  rhythm. No murmurs, rubs, or gallops. Msk:  Strength and tone normal for age. Extremities/Skin: Warm and dry. Neuro: Alert and oriented X 3. Moves all extremities spontaneously. Gait is normal. CNII-XII grossly in tact. Psych:  Responds to questions appropriately with a normal affect.     ASSESSMENT AND PLAN:  82 y.o. year old male with  1. Bacterial respiratory infection He is to take abx as directed. Also use Mucinex DM as expectorant. F/U if symptoms do not resolve within one week of completion of abx.  Irrigate ears now. In future, use otc drops to prevent recurrence.  - azithromycin (ZITHROMAX) 250 MG tablet; Day 1: Take 2 daily.  Days 2-5: Take 1 daily.  Dispense: 6 tablet; Refill: 0   Signed, [address] [place], [place], [facility] 04/15/2015 12:17 PM

## 2015-04-20 ENCOUNTER — Ambulatory Visit (INDEPENDENT_AMBULATORY_CARE_PROVIDER_SITE_OTHER): Payer: Commercial Managed Care - HMO | Admitting: Family Medicine

## 2015-04-20 ENCOUNTER — Encounter: Payer: Self-pay | Admitting: Family Medicine

## 2015-04-20 VITALS — BP 140/68 | HR 100 | Temp 97.4°F | Resp 22 | Wt 183.0 lb

## 2015-04-20 DIAGNOSIS — F411 Generalized anxiety disorder: Secondary | ICD-10-CM | POA: Diagnosis not present

## 2015-04-20 DIAGNOSIS — R251 Tremor, unspecified: Secondary | ICD-10-CM

## 2015-04-20 DIAGNOSIS — R531 Weakness: Secondary | ICD-10-CM

## 2015-04-21 ENCOUNTER — Encounter: Payer: Self-pay | Admitting: Family Medicine

## 2015-04-21 LAB — COMPLETE METABOLIC PANEL WITH GFR
ALT: 12 U/L (ref 9–46)
AST: 15 U/L (ref 10–35)
Albumin: 3.9 g/dL (ref 3.6–5.1)
Alkaline Phosphatase: 68 U/L (ref 40–115)
BUN: 13 mg/dL (ref 7–25)
CO2: 21 mmol/L (ref 20–31)
Calcium: 8.7 mg/dL (ref 8.6–10.3)
Chloride: 107 mmol/L (ref 98–110)
Creat: 0.87 mg/dL (ref 0.70–1.11)
GFR, Est African American: >89 mL/min (ref >=60)
GFR, Est Non African American: 79 mL/min (ref >=60)
Glucose, Bld: 124 mg/dL — ABNORMAL HIGH (ref 70–99)
Potassium: 3.6 mmol/L (ref 3.5–5.3)
Sodium: 139 mmol/L (ref 135–146)
Total Bilirubin: 0.6 mg/dL (ref 0.2–1.2)
Total Protein: 6.1 g/dL (ref 6.1–8.1)

## 2015-04-21 LAB — CBC WITH DIFFERENTIAL/PLATELET
Basophils Absolute: 0 10*3/uL (ref 0.0–0.1)
Basophils Relative: 0 % (ref 0–1)
Eosinophils Absolute: 0 10*3/uL (ref 0.0–0.7)
Eosinophils Relative: 0 % (ref 0–5)
HCT: 39.5 % (ref 39.0–52.0)
Hemoglobin: 13.2 g/dL (ref 13.0–17.0)
Lymphocytes Relative: 13 % (ref 12–46)
Lymphs Abs: 1.1 10*3/uL (ref 0.7–4.0)
MCH: 32.5 pg (ref 26.0–34.0)
MCHC: 33.4 g/dL (ref 30.0–36.0)
MCV: 97.3 fL (ref 78.0–100.0)
MPV: 11.1 fL (ref 8.6–12.4)
Monocytes Absolute: 0.7 10*3/uL (ref 0.1–1.0)
Monocytes Relative: 9 % (ref 3–12)
Neutro Abs: 6.4 10*3/uL (ref 1.7–7.7)
Neutrophils Relative %: 78 % — ABNORMAL HIGH (ref 43–77)
Platelets: 140 10*3/uL — ABNORMAL LOW (ref 150–400)
RBC: 4.06 MIL/uL — ABNORMAL LOW (ref 4.22–5.81)
RDW: 13.3 % (ref 11.5–15.5)
WBC: 8.2 10*3/uL (ref 4.0–10.5)

## 2015-04-21 LAB — TSH: TSH: 1.439 u[IU]/mL (ref 0.350–4.500)

## 2015-04-21 LAB — GLUCOSE, FINGERSTICK (STAT): Glucose, fingerstick: 120 mg/dL — ABNORMAL HIGH (ref 70–99)

## 2015-04-21 NOTE — Progress Notes (Signed)
Subjective:    Patient ID: Evan [name], male    DOB: [birth year], 82 y.o.   MRN: [id number]  HPI Patient walked in this afternoon complaining of uncontrollable shaking.  Over the last few weeks he states that he is just not felt right. However he is unable to characterize further into words other than repeatedly saying he feels weak. This afternoon he awoke from a nap and found himself shaking all over his body. I have witnessed the shaking today on examination and it is more of a tremor in both hands and even in his head and neck area and however after we talk for an extended period of time, the tremor subsides with distraction. Blood sugar is 120. The remainder of his vital signs are normal. The patient does appear anxious. I reviewed all of his pill bottles. He has multiple bottles of most of his medication all in one bag. Some are more than 6-12 months out of date. The majority still have pills in the bottle that should be gone. This leads me to believe that the patient is not taking his medication regularly or as prescribed. He was seen here one week ago for an upper respiratory infection. At that time he left his pill bottles here. He therefore he has not been taking these medications for at least a week. However he states that he has other pills at home. The remainder of his review of systems is negative. He denies any chest pain. He denies any shortness of breath. He denies any palpitations. He denies any cough. He denies any nausea or vomiting or diarrhea. He denies any fever. He denies any blood in his stool. He denies any dysuria. Past Medical History  Diagnosis Date  . CAD (coronary artery disease)   . BPH (benign prostatic hyperplasia)   . Arthritis   . Asthma   . GERD (gastroesophageal reflux disease)   . Hypertension   . Anxiety   . History of ASCVD     MULTIVESSEL  . Anginal chest pain at rest [facility])     Chronic non, controlled on Lorazepam  . Hyperlipidemia   . DJD (degenerative  joint disease)   . Hemochromatosis     Possible, elevated iron stores, hook-like osteophytes on hand films, normal LFTs  . Esophageal ulcer without bleeding     bid ppi indefinitely   Past Surgical History  Procedure Laterality Date  . Cholecystectomy    . Rotator cuff repair    . Knee surgery    . Coronary artery bypass graft    . Descending aortic aneurysm repair w/ stent      DE stent ostium into the right radial free graft at OM-, 02-2007  . Esophagogastroduodenoscopy N/A 11/09/2014    Procedure: ESOPHAGOGASTRODUODENOSCOPY (EGD);  Surgeon: [name], MD;  Location: [facility] ENDOSCOPY;  Service: Endoscopy;  Laterality: N/A;   Current Outpatient Prescriptions on File Prior to Visit  Medication Sig Dispense Refill  . atenolol (TENORMIN) 50 MG tablet TAKE 1 TABLET DAILY. 90 tablet 0  . clopidogrel (PLAVIX) 75 MG tablet Do not resume taking  this medicine until Friday, August 12. Then resume taking on a daily basis 90 tablet 3  . diclofenac sodium (VOLTAREN) 1 % GEL Apply 2 g topically daily as needed (for pain in knees and feet).    . fluocinonide cream (LIDEX) 0.05 % Reported on 04/15/2015    . FLUoxetine (PROZAC) 20 MG capsule TAKE ONE CAPSULE BY MOUTH DAILY 30 capsule 5  .  folic acid (FOLVITE) 1 MG tablet Take 1 mg by mouth daily.    Marland Kitchen HYDROcodone-acetaminophen (NORCO/VICODIN) 5-325 MG tablet Take 1 tablet by mouth every 6 (six) hours as needed. 30 tablet 0  . LORazepam (ATIVAN) 1 MG tablet TAKE ONE (1) TABLET BY MOUTH 3 TIMES DAILY. TAKE AT 8AM, 12 NOON, AND4PM. 90 tablet 2  . Melatonin 5 MG TABS Take 5 mg by mouth at bedtime. Reported on 04/15/2015    . methotrexate (RHEUMATREX) 2.5 MG tablet Take 20 mg by mouth every Monday. Reported on 04/15/2015    . nitroGLYCERIN (NITROSTAT) 0.4 MG SL tablet Place 1 tablet (0.4 mg total) under the tongue every 5 (five) minutes as needed for chest pain. 30 tablet 3  . pantoprazole (PROTONIX) 40 MG tablet Take 1 tablet (40 mg total) by mouth 2 (two) times  daily. 60 tablet 11  . predniSONE (DELTASONE) 5 MG tablet Take 5 mg by mouth daily with breakfast.    . rosuvastatin (CRESTOR) 5 MG tablet TAKE 1 TABLET EVERY DAY 90 tablet 0  . sodium chloride (OCEAN) 0.65 % SOLN nasal spray Place 1 spray into both nostrils as needed for congestion. 1 Bottle 0  . tamsulosin (FLOMAX) 0.4 MG CAPS capsule TAKE 1 CAPSULE EVERY DAY 90 capsule 3  . temazepam (RESTORIL) 30 MG capsule Take 30 mg by mouth at bedtime as needed for sleep.     No current facility-administered medications on file prior to visit.   Allergies  Allergen Reactions  . Feldene [Piroxicam] Other (See Comments)    REACTION: Blisters  . Statins Other (See Comments)    Myalgias  . Doxycycline Other (See Comments)    REACTION:  unknown  . Tequin Anxiety  . Valium Other (See Comments)    REACTION:  unknown   Social History   Social History  . Marital Status: Married    Spouse Name: N/A  . Number of Children: N/A  . Years of Education: N/A   Occupational History  . Not on file.   Social History Main Topics  . Smoking status: Never Smoker   . Smokeless tobacco: Former Systems developer  . Alcohol Use: No  . Drug Use: No  . Sexual Activity: Not on file   Other Topics Concern  . Not on file   Social History Narrative      Review of Systems  All other systems reviewed and are negative.      Objective:   Physical Exam  Constitutional: He is oriented to person, place, and time. He appears well-developed and well-nourished. No distress.  HENT:  Head: Normocephalic and atraumatic.  Right Ear: External ear normal.  Left Ear: External ear normal.  Nose: Nose normal.  Mouth/Throat: Oropharynx is clear and moist. No oropharyngeal exudate.  Eyes: Conjunctivae and EOM are normal. Pupils are equal, round, and reactive to light. No scleral icterus.  Neck: Neck supple. No JVD present. No thyromegaly present.  Cardiovascular: Normal rate, regular rhythm and normal heart sounds.  Exam reveals  no gallop and no friction rub.   No murmur heard. Pulmonary/Chest: Effort normal and breath sounds normal. No respiratory distress. He has no wheezes. He has no rales.  Abdominal: Soft. Bowel sounds are normal. He exhibits no distension and no mass. There is no tenderness. There is no rebound and no guarding.  Musculoskeletal: He exhibits no edema.  Lymphadenopathy:    He has no cervical adenopathy.  Neurological: He is alert and oriented to person, place, and time. He has  normal reflexes. He displays tremor. He displays normal reflexes. No cranial nerve deficit. He exhibits normal muscle tone. Coordination normal.  Skin: No rash noted. He is not diaphoretic.  Psychiatric: His mood appears anxious.  Vitals reviewed.         Assessment & Plan:  Shaky - Plan: Glucose, fingerstick (stat), CBC with Differential/Platelet, COMPLETE METABOLIC PANEL WITH GFR, TSH  Weakness  GAD (generalized anxiety disorder)  Tremor of both hands  I'll obtain baseline lab work including a CBC, CMP, and TSH. Vital signs and physical exam are reassuring. The tremor subsided the longer we talked leading me to believe it could be anxiety that improved with distraction. It is also possible he has underlying essential tremor that is exacerbated by anxiety. Also believe is possible that this could be medication withdrawal or due to improperly taking his medication. I do not believe this is seizure activity is limited to the upper extremities the head and the neck. It seems to be waxing and waning in nature depending on how distracted the patient is. There is no loss of consciousness or bowel or bladder incontinence. I've asked the patient's family to take home a medication list today take all of his pill bottles thrown away any old out of date duplicates and make sure that the patient is taking his medication correctly. Patient is managing his own pills and he takes medication as he feels that he needs them. Frequently he  talks about just stopping them all. I believe this could be mainly due to anxiety exacerbated by withdrawal from medication with possibly a mild underlying essential tremor. Certainly if symptoms worsen we can get a neurology consultation.

## 2015-04-29 ENCOUNTER — Telehealth: Payer: Self-pay | Admitting: Family Medicine
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
## 2015-05-06 ENCOUNTER — Ambulatory Visit (INDEPENDENT_AMBULATORY_CARE_PROVIDER_SITE_OTHER): Payer: Commercial Managed Care - HMO | Admitting: Physician Assistant

## 2015-05-06 ENCOUNTER — Encounter: Payer: Self-pay | Admitting: Physician Assistant

## 2015-05-06 VITALS — BP 124/80 | HR 60 | Temp 98.0°F | Resp 18 | Wt 184.0 lb

## 2015-05-06 DIAGNOSIS — L989 Disorder of the skin and subcutaneous tissue, unspecified: Secondary | ICD-10-CM | POA: Diagnosis not present

## 2015-05-06 DIAGNOSIS — L821 Other seborrheic keratosis: Secondary | ICD-10-CM | POA: Diagnosis not present

## 2015-05-06 NOTE — Progress Notes (Signed)
Patient ID: Evan [name] MRN: [id number], DOB: February 17, 1928, 82 y.o. Date of Encounter: 05/06/2015, 12:10 PM    Chief Complaint:  Chief Complaint  Patient presents with  . rash on scalp     HPI: 82 y.o. year old white male says that he has noticed an area on the left side of his scalp. Says that it is itchy. Says that "one place was there for 1 or 2 years now-- but now it ahs started spreading."      Home Meds:   Outpatient Prescriptions Prior to Visit  Medication Sig Dispense Refill  . atenolol (TENORMIN) 50 MG tablet TAKE 1 TABLET DAILY. 90 tablet 0  . clopidogrel (PLAVIX) 75 MG tablet Do not resume taking  this medicine until Friday, August 12. Then resume taking on a daily basis 90 tablet 3  . diclofenac sodium (VOLTAREN) 1 % GEL Apply 2 g topically daily as needed (for pain in knees and feet).    . fluocinonide cream (LIDEX) 0.05 % Reported on 04/15/2015    . FLUoxetine (PROZAC) 20 MG capsule TAKE ONE CAPSULE BY MOUTH DAILY 30 capsule 5  . folic acid (FOLVITE) 1 MG tablet Take 1 mg by mouth daily.    Marland Kitchen HYDROcodone-acetaminophen (NORCO/VICODIN) 5-325 MG tablet Take 1 tablet by mouth every 6 (six) hours as needed. 30 tablet 0  . LORazepam (ATIVAN) 1 MG tablet TAKE ONE (1) TABLET BY MOUTH 3 TIMES DAILY. TAKE AT 8AM, 12 NOON, AND4PM. 90 tablet 2  . Melatonin 5 MG TABS Take 5 mg by mouth at bedtime. Reported on 04/15/2015    . methotrexate (RHEUMATREX) 2.5 MG tablet Take 20 mg by mouth every Monday. Reported on 04/15/2015    . nitroGLYCERIN (NITROSTAT) 0.4 MG SL tablet Place 1 tablet (0.4 mg total) under the tongue every 5 (five) minutes as needed for chest pain. 30 tablet 3  . pantoprazole (PROTONIX) 40 MG tablet Take 1 tablet (40 mg total) by mouth 2 (two) times daily. 60 tablet 11  . predniSONE (DELTASONE) 5 MG tablet Take 5 mg by mouth daily with breakfast.    . rosuvastatin (CRESTOR) 5 MG tablet TAKE 1 TABLET EVERY DAY 90 tablet 0  . sodium chloride (OCEAN) 0.65 % SOLN nasal  spray Place 1 spray into both nostrils as needed for congestion. 1 Bottle 0  . tamsulosin (FLOMAX) 0.4 MG CAPS capsule TAKE 1 CAPSULE EVERY DAY 90 capsule 3  . temazepam (RESTORIL) 30 MG capsule Take 30 mg by mouth at bedtime as needed for sleep.     No facility-administered medications prior to visit.    Allergies:  Allergies  Allergen Reactions  . Feldene [Piroxicam] Other (See Comments)    REACTION: Blisters  . Statins Other (See Comments)    Myalgias  . Doxycycline Other (See Comments)    REACTION:  unknown  . Tequin Anxiety  . Valium Other (See Comments)    REACTION:  unknown      Review of Systems: See HPI for pertinent ROS. All other ROS negative.    Physical Exam: Blood pressure 124/80, pulse 60, temperature 98 F (36.7 C), temperature source Oral, resp. rate 18, weight 184 lb (83.462 kg)., Body mass index is 27.98 kg/(m^2). General: WNWD WM.  Appears in no acute distress. Neck: Supple. No thyromegaly. No lymphadenopathy. Lungs: Clear bilaterally to auscultation without wheezes, rales, or rhonchi. Breathing is unlabored. Heart: Regular rhythm. No murmurs, rubs, or gallops. Msk:  Strength and tone normal for age. Skin: On  the scalp---1 inch posterior to ear--there is a lesion that is approx 1cm x 0.75 cm--irregular shape--same color as his skin/flesh--raised approx 50m--top surface is not smooth--irregular.  Just anterior to this lesion, are 2 smaller lesions---one measures approx 0.5cm and the other approx 0.3 cm diameter.  Neuro: Alert and oriented X 3. Moves all extremities spontaneously. Gait is normal. CNII-XII grossly in tact. Psych:  Responds to questions appropriately with a normal affect.     ASSESSMENT AND PLAN:  82y.o. year old male with  1. Skin lesion Discussed with patient that lesion is concerning for a skin cancer possible squamous cell carcinoma. Discussed with him that we could take a biopsy and send off to pathology. He wants to go ahead and  proceed with this today. Site cleansed with Betadine and alcohol swab. Site anesthetized with epi/lidocaine. Shave biopsy performed. Minimal bleeding which was easily controlled/ceased.  Specimen sent to pathology. Will follow-up with patient with pathology results and further recommendations at that time. - Pathology   Signed, [address] [facility]2/04/2015 12:10 PM

## 2015-05-07 LAB — PATHOLOGY

## 2015-05-08 ENCOUNTER — Telehealth: Payer: Self-pay | Admitting: Family Medicine

## 2015-05-08 MED ORDER — HYDROCORTISONE 2.5 % EX CREA: TOPICAL_CREAM | Freq: Two times a day (BID) | CUTANEOUS | Status: DC

## 2015-05-08 NOTE — Telephone Encounter (Signed)
[name]  Patient is calling to say that the rash he was recently seen for is spreading, and would like to know if something can be called in or does he need to be seen again? [phone]

## 2015-05-08 NOTE — Telephone Encounter (Signed)
Pt aware of path report and rx for hydrocortisone cream to pharmacy

## 2015-05-08 NOTE — Telephone Encounter (Signed)
-----   Message from Orlena Sheldon, PA-C sent at 05/07/2015  4:30 PM EST ----- Tell pt that lesion that we removed is benign.  Once site heals, can apply hydrocortisone cream to control itching--at OV he said that it itches. Tell him can use this just if needed for itching---o/w no treatment needed---no cream will make lesion go away.

## 2015-05-11 DIAGNOSIS — L259 Unspecified contact dermatitis, unspecified cause: Secondary | ICD-10-CM | POA: Diagnosis not present

## 2015-05-18 ENCOUNTER — Other Ambulatory Visit: Payer: Self-pay | Admitting: Family Medicine

## 2015-05-18 DIAGNOSIS — M353 Polymyalgia rheumatica: Secondary | ICD-10-CM | POA: Diagnosis not present

## 2015-05-18 DIAGNOSIS — Z79899 Other long term (current) drug therapy: Secondary | ICD-10-CM | POA: Diagnosis not present

## 2015-05-18 DIAGNOSIS — M159 Polyosteoarthritis, unspecified: Secondary | ICD-10-CM | POA: Diagnosis not present

## 2015-05-18 DIAGNOSIS — M2550 Pain in unspecified joint: Secondary | ICD-10-CM | POA: Diagnosis not present

## 2015-05-18 DIAGNOSIS — M810 Age-related osteoporosis without current pathological fracture: Secondary | ICD-10-CM | POA: Diagnosis not present

## 2015-05-19 NOTE — Telephone Encounter (Signed)
Ok to refill??  Last office visit 05/06/2015.  Last refill 02/17/2015, #2 refills.

## 2015-05-19 NOTE — Telephone Encounter (Signed)
ok 

## 2015-05-19 NOTE — Telephone Encounter (Signed)
Medication called to pharmacy. 

## 2015-05-25 DIAGNOSIS — L299 Pruritus, unspecified: Secondary | ICD-10-CM | POA: Diagnosis not present

## 2015-06-04 ENCOUNTER — Ambulatory Visit: Payer: Commercial Managed Care - HMO | Admitting: Family Medicine

## 2015-06-05 ENCOUNTER — Encounter: Payer: Self-pay | Admitting: Family Medicine

## 2015-06-05 ENCOUNTER — Other Ambulatory Visit: Payer: Self-pay | Admitting: Family Medicine

## 2015-06-05 ENCOUNTER — Ambulatory Visit (INDEPENDENT_AMBULATORY_CARE_PROVIDER_SITE_OTHER): Payer: Commercial Managed Care - HMO | Admitting: Family Medicine

## 2015-06-05 VITALS — BP 122/70 | HR 64 | Temp 98.2°F | Resp 16 | Ht 68.0 in | Wt 182.0 lb

## 2015-06-05 DIAGNOSIS — L0100 Impetigo, unspecified: Secondary | ICD-10-CM

## 2015-06-05 DIAGNOSIS — L209 Atopic dermatitis, unspecified: Secondary | ICD-10-CM

## 2015-06-05 MED ORDER — MOMETASONE FUROATE 0.1 % EX OINT: TOPICAL_OINTMENT | Freq: Every day | CUTANEOUS | Status: DC

## 2015-06-05 MED ORDER — CEPHALEXIN 500 MG PO CAPS: 500.0000 mg | ORAL_CAPSULE | Freq: Three times a day (TID) | ORAL | Status: DC

## 2015-06-05 NOTE — Progress Notes (Signed)
Subjective:    Patient ID: Evan [name], male    DOB: 04-27-1927, 82 y.o.   MRN: [id number]  HPI   patient is here today complaining of itching. This is the first time I have seen the patient for this. Recently he saw the 55 assistant who did a skin biopsy that revealed no pathologic lesion. He is subsequently seen a dermatologist twice and been prescribed cream without improvement. He has an eczematous form rash on the left side of his neck with secondary superinfection with impetigo. There is thick yellow honey colored scale overlying erythematous skin.  He has a papulosquamous eruption on the side of his neck and also itching on the volar surface of his right wrist but no visible rash in that area. Past Medical History  Diagnosis Date  . CAD (coronary artery disease)   . BPH (benign prostatic hyperplasia)   . Arthritis   . Asthma   . GERD (gastroesophageal reflux disease)   . Hypertension   . Anxiety   . History of ASCVD     MULTIVESSEL  . Anginal chest pain at rest [facility])     Chronic non, controlled on Lorazepam  . Hyperlipidemia   . DJD (degenerative joint disease)   . Hemochromatosis     Possible, elevated iron stores, hook-like osteophytes on hand films, normal LFTs  . Esophageal ulcer without bleeding     bid ppi indefinitely   Past Surgical History  Procedure Laterality Date  . Cholecystectomy    . Rotator cuff repair    . Knee surgery    . Coronary artery bypass graft    . Descending aortic aneurysm repair w/ stent      DE stent ostium into the right radial free graft at OM-, 02-2007  . Esophagogastroduodenoscopy N/A 11/09/2014    Procedure: ESOPHAGOGASTRODUODENOSCOPY (EGD);  Surgeon: [name], MD;  Location: [facility] ENDOSCOPY;  Service: Endoscopy;  Laterality: N/A;   Current Outpatient Prescriptions on File Prior to Visit  Medication Sig Dispense Refill  . atenolol (TENORMIN) 50 MG tablet TAKE 1 TABLET DAILY. 90 tablet 0  . clopidogrel (PLAVIX) 75 MG tablet Do  not resume taking  this medicine until Friday, August 12. Then resume taking on a daily basis 90 tablet 3  . diclofenac sodium (VOLTAREN) 1 % GEL Apply 2 g topically daily as needed (for pain in knees and feet).    . fluocinonide cream (LIDEX) 0.05 % Reported on 04/15/2015    . FLUoxetine (PROZAC) 20 MG capsule TAKE ONE CAPSULE BY MOUTH DAILY 30 capsule 5  . folic acid (FOLVITE) 1 MG tablet Take 1 mg by mouth daily.    Marland Kitchen HYDROcodone-acetaminophen (NORCO/VICODIN) 5-325 MG tablet Take 1 tablet by mouth every 6 (six) hours as needed. 30 tablet 0  . hydrocortisone 2.5 % cream Apply topically 2 (two) times daily. 30 g 0  . LORazepam (ATIVAN) 1 MG tablet TAKE ONE (1) TABLET BY MOUTH 3 TIMES DAILY. TAKE AT 8AM, 12 NOON, AND4PM. 90 tablet 2  . Melatonin 5 MG TABS Take 5 mg by mouth at bedtime. Reported on 04/15/2015    . methotrexate (RHEUMATREX) 2.5 MG tablet Take 20 mg by mouth every Monday. Reported on 04/15/2015    . nitroGLYCERIN (NITROSTAT) 0.4 MG SL tablet Place 1 tablet (0.4 mg total) under the tongue every 5 (five) minutes as needed for chest pain. 30 tablet 3  . pantoprazole (PROTONIX) 40 MG tablet Take 1 tablet (40 mg total) by mouth 2 (two) times  daily. 60 tablet 11  . predniSONE (DELTASONE) 5 MG tablet Take 5 mg by mouth daily with breakfast.    . rosuvastatin (CRESTOR) 5 MG tablet TAKE 1 TABLET EVERY DAY 90 tablet 0  . sodium chloride (OCEAN) 0.65 % SOLN nasal spray Place 1 spray into both nostrils as needed for congestion. 1 Bottle 0  . tamsulosin (FLOMAX) 0.4 MG CAPS capsule TAKE 1 CAPSULE EVERY DAY 90 capsule 3  . temazepam (RESTORIL) 30 MG capsule Take 30 mg by mouth at bedtime as needed for sleep.     No current facility-administered medications on file prior to visit.   Allergies  Allergen Reactions  . Feldene [Piroxicam] Other (See Comments)    REACTION: Blisters  . Statins Other (See Comments)    Myalgias  . Doxycycline Other (See Comments)    REACTION:  unknown  . Tequin  Anxiety  . Valium Other (See Comments)    REACTION:  unknown   Social History   Social History  . Marital Status: Married    Spouse Name: N/A  . Number of Children: N/A  . Years of Education: N/A   Occupational History  . Not on file.   Social History Main Topics  . Smoking status: Never Smoker   . Smokeless tobacco: Former Systems developer  . Alcohol Use: No  . Drug Use: No  . Sexual Activity: Not on file   Other Topics Concern  . Not on file   Social History Narrative      Review of Systems  All other systems reviewed and are negative.      Objective:   Physical Exam  Cardiovascular: Normal rate and regular rhythm.   Pulmonary/Chest: Effort normal and breath sounds normal.  Skin: Rash noted. There is erythema.  Vitals reviewed.         Assessment & Plan:  Atopic dermatitis - Plan: mometasone (ELOCON) 0.1 % ointment  Impetigo - Plan: cephALEXin (KEFLEX) 500 MG capsule   This is the first time I have seen the patient for this condition. He appears to have an eczematous form rash on the left side of his neck. I recommended Elocon ointment be applied 1-2 times a day for the next week to treat this rash on his neck and to help with itching. However it appears to be super infected with impetigo for which I will place the patient on Keflex 500 mg by mouth 3 times a day for 7 days. He is scheduled to follow-up with a dermatologist later this week which I encouraged him to keep that appointment.

## 2015-06-05 NOTE — Telephone Encounter (Signed)
Refill appropriate and filled per protocol. 

## 2015-06-08 ENCOUNTER — Ambulatory Visit: Payer: Commercial Managed Care - HMO | Admitting: Family Medicine

## 2015-06-10 DIAGNOSIS — L2389 Allergic contact dermatitis due to other agents: Secondary | ICD-10-CM | POA: Diagnosis not present

## 2015-06-11 ENCOUNTER — Telehealth: Payer: Self-pay | Admitting: Family Medicine

## 2015-06-11 NOTE — Telephone Encounter (Signed)
Pt needs a  [employer] referral to Dr. Amedeo Plenty for his appt on 07/14/15

## 2015-06-12 ENCOUNTER — Encounter: Payer: Self-pay | Admitting: Family Medicine

## 2015-06-15 ENCOUNTER — Telehealth: Payer: Self-pay | Admitting: Family Medicine

## 2015-06-15 ENCOUNTER — Other Ambulatory Visit: Payer: Self-pay | Admitting: Interventional Cardiology

## 2015-06-15 ENCOUNTER — Other Ambulatory Visit: Payer: Self-pay | Admitting: Family Medicine

## 2015-06-15 DIAGNOSIS — M159 Polyosteoarthritis, unspecified: Secondary | ICD-10-CM | POA: Diagnosis not present

## 2015-06-15 DIAGNOSIS — M810 Age-related osteoporosis without current pathological fracture: Secondary | ICD-10-CM | POA: Diagnosis not present

## 2015-06-15 DIAGNOSIS — M2550 Pain in unspecified joint: Secondary | ICD-10-CM | POA: Diagnosis not present

## 2015-06-15 DIAGNOSIS — M353 Polymyalgia rheumatica: Secondary | ICD-10-CM | POA: Diagnosis not present

## 2015-06-15 MED ORDER — TAMSULOSIN HCL 0.4 MG PO CAPS: 0.4000 mg | ORAL_CAPSULE | Freq: Every day | ORAL | Status: DC

## 2015-06-15 NOTE — Telephone Encounter (Signed)
Medication refilled per protocol. 

## 2015-06-15 NOTE — Telephone Encounter (Signed)
Pharmacy is requesting a 30 day supply of Tamsulosin 0.4 mg until the refill comes via mail order.

## 2015-06-15 NOTE — Telephone Encounter (Signed)
Medication called/sent to requested pharmacy  

## 2015-06-15 NOTE — Telephone Encounter (Signed)
Submitted [employer] referral thru [employer] [facility] for authorization on 06/11/15 to Dr. Bea Graff with authorization [id number]  Requesting provider: Flonnie Hailstone  Treating provider: Bea Graff  Number of visits:6  Start Date: 06/11/15  End Date:12/08/15  Dx: R13.10- Dysphagia,unnspecified

## 2015-06-26 ENCOUNTER — Other Ambulatory Visit: Payer: Self-pay | Admitting: Family Medicine

## 2015-06-26 NOTE — Telephone Encounter (Signed)
Refill appropriate and filled per protocol. 

## 2015-07-14 ENCOUNTER — Telehealth: Payer: Self-pay

## 2015-07-14 ENCOUNTER — Other Ambulatory Visit: Payer: Self-pay | Admitting: Family Medicine

## 2015-07-14 ENCOUNTER — Other Ambulatory Visit: Payer: Self-pay | Admitting: *Deleted

## 2015-07-14 ENCOUNTER — Other Ambulatory Visit: Payer: Self-pay | Admitting: Physician Assistant

## 2015-07-14 DIAGNOSIS — R1314 Dysphagia, pharyngoesophageal phase: Secondary | ICD-10-CM | POA: Diagnosis not present

## 2015-07-14 MED ORDER — ROSUVASTATIN CALCIUM 5 MG PO TABS: ORAL_TABLET | ORAL | Status: DC

## 2015-07-14 MED ORDER — PANTOPRAZOLE SODIUM 40 MG PO TBEC: DELAYED_RELEASE_TABLET | ORAL | Status: DC

## 2015-07-14 MED ORDER — NITROGLYCERIN 0.4 MG SL SUBL: 0.4000 mg | SUBLINGUAL_TABLET | SUBLINGUAL | Status: DC | PRN

## 2015-07-14 MED ORDER — PANTOPRAZOLE SODIUM 40 MG PO TBEC
DELAYED_RELEASE_TABLET | ORAL | Status: DC
Start: 2015-07-14 — End: 2015-07-14

## 2015-07-14 MED ORDER — CLOPIDOGREL BISULFATE 75 MG PO TABS: ORAL_TABLET | ORAL | Status: DC

## 2015-07-14 MED ORDER — ATENOLOL 50 MG PO TABS: ORAL_TABLET | ORAL | Status: DC

## 2015-07-14 NOTE — Telephone Encounter (Signed)
Refill appropriate and filled per protocol. 

## 2015-07-17 ENCOUNTER — Ambulatory Visit: Payer: Medicare HMO | Admitting: Physician Assistant

## 2015-07-21 ENCOUNTER — Telehealth: Payer: Self-pay

## 2015-07-21 NOTE — Telephone Encounter (Addendum)
The pt is scheduled to have a Endoscopy on 5/2 with Dr Amedeo Plenty at [facility].  The pt has not seen Dr Irish Lack since 04/28/2014 and needs an apt prior to procedure. Per Jari Sportsman, scheduling supervisor, she will call the pt and schedule an apt for the pt to be seen prior to 5/2.  I called the pt to advise him that he will need an OV prior to his procedure but there was no answer and no way to leave a message.

## 2015-07-31 DIAGNOSIS — M810 Age-related osteoporosis without current pathological fracture: Secondary | ICD-10-CM | POA: Diagnosis not present

## 2015-07-31 DIAGNOSIS — Z79899 Other long term (current) drug therapy: Secondary | ICD-10-CM | POA: Diagnosis not present

## 2015-07-31 DIAGNOSIS — M2550 Pain in unspecified joint: Secondary | ICD-10-CM | POA: Diagnosis not present

## 2015-07-31 DIAGNOSIS — M353 Polymyalgia rheumatica: Secondary | ICD-10-CM | POA: Diagnosis not present

## 2015-07-31 DIAGNOSIS — M159 Polyosteoarthritis, unspecified: Secondary | ICD-10-CM | POA: Diagnosis not present

## 2015-08-02 NOTE — Progress Notes (Signed)
Cardiology Office Note:    Date:  08/03/2015   ID:  Evan [name], DOB [birth year], MRN [id number]  PCP:  Odette Fraction, MD  Cardiologist:  Dr. Casandra Doffing   Electrophysiologist:  n/a  Referring MD: Evan Frizzle, MD   Chief Complaint  Patient presents with  . Surgical Clearance    History of Present Illness:     Evan [name] is a 82 y.o. male with a hx of CAD s/p CABG in 1998 and subsequent PCI with stent to LCx in 2008, HTN, HL, hemochromatosis. Last seen by Dr. Irish [name] 1/16.   The patient needs to undergo EGD with possible esophageal dilatation. Gastroenterologist is Dr. Amedeo Plenty. He needs Plavix held for 7 days prior to his procedure. He is added on today for further evaluation prior to his procedure. Since last seen, the patient is doing well. Denies chest discomfort. He remains quite active. He exercises on a treadmill and stationary bike at home. He is able to climb steps without difficulty. Denies significant dyspnea. Denies orthopnea, PND or edema. Denies syncope. He does get lightheaded when he stands at times. He feels somewhat weak in the mornings.   Past Medical History  Diagnosis Date  . CAD (coronary artery disease)   . BPH (benign prostatic hyperplasia)   . Arthritis   . Asthma   . GERD (gastroesophageal reflux disease)   . Hypertension   . Anxiety   . History of ASCVD     MULTIVESSEL  . Anginal chest pain at rest [facility])     Chronic non, controlled on Lorazepam  . Hyperlipidemia   . DJD (degenerative joint disease)   . Hemochromatosis     Possible, elevated iron stores, hook-like osteophytes on hand films, normal LFTs  . Esophageal ulcer without bleeding     bid ppi indefinitely    Past Surgical History  Procedure Laterality Date  . Cholecystectomy    . Rotator cuff repair    . Knee surgery    . Coronary artery bypass graft    . Descending aortic aneurysm repair w/ stent      DE stent ostium into the right radial free graft at OM-, 02-2007  .  Esophagogastroduodenoscopy N/A 11/09/2014    Procedure: ESOPHAGOGASTRODUODENOSCOPY (EGD);  Surgeon: Evan Irani, MD;  Location: [facility] ENDOSCOPY;  Service: Endoscopy;  Laterality: N/A;    Current Medications: Outpatient Prescriptions Prior to Visit  Medication Sig Dispense Refill  . atenolol (TENORMIN) 50 MG tablet TAKE 1 TABLET BY MOUTH  EVERY DAY 90 tablet 0  . clopidogrel (PLAVIX) 75 MG tablet TAKE 1 TABLET BY MOUTH  EVERY DAY 90 tablet 0  . diclofenac sodium (VOLTAREN) 1 % GEL Apply 2 g topically daily as needed (for pain in knees and feet).    . fluocinonide cream (LIDEX) 7.86 % Apply 1 application topically 2 (two) times daily. Reported on 04/15/2015    . FLUoxetine (PROZAC) 20 MG capsule TAKE ONE CAPSULE BY MOUTH DAILY 90 capsule 3  . folic acid (FOLVITE) 1 MG tablet Take 1 mg by mouth daily.    Marland Kitchen HYDROcodone-acetaminophen (NORCO/VICODIN) 5-325 MG tablet Take 1 tablet by mouth every 6 (six) hours as needed. 30 tablet 0  . hydrocortisone 2.5 % cream APPLY TOPICALLY TWO TIMES DAILY 30 g 3  . LORazepam (ATIVAN) 1 MG tablet TAKE ONE (1) TABLET BY MOUTH 3 TIMES DAILY. TAKE AT 8AM, 12 NOON, AND4PM. 90 tablet 2  . methotrexate (RHEUMATREX) 2.5 MG tablet Take 20 mg by mouth  every Monday. Reported on 04/15/2015    . mometasone (ELOCON) 0.1 % ointment APPLY TO THE AFFECTED AREAS DAILY AS DIRECTED 45 g 3  . nitroGLYCERIN (NITROSTAT) 0.4 MG SL tablet Place 1 tablet (0.4 mg total) under the tongue every 5 (five) minutes as needed for chest pain. 25 tablet 0  . pantoprazole (PROTONIX) 40 MG tablet TAKE 1 TABLET BY MOUTH EVERY DAY 90 tablet 0  . rosuvastatin (CRESTOR) 5 MG tablet TAKE 1 TABLET BY MOUTH  EVERY DAY 90 tablet 0  . sodium chloride (OCEAN) 0.65 % SOLN nasal spray Place 1 spray into both nostrils as needed for congestion. 1 Bottle 0  . tamsulosin (FLOMAX) 0.4 MG CAPS capsule TAKE 1 CAPSULE EVERY DAY 90 capsule 3  . temazepam (RESTORIL) 30 MG capsule Take 30 mg by mouth at bedtime as needed for  sleep.    . traZODone (DESYREL) 50 MG tablet TAKE TWO TABLETS BY MOUTH AT BEDTIME AS NEEDED FOR SLEEP 60 tablet 3  . cephALEXin (KEFLEX) 500 MG capsule Take 1 capsule (500 mg total) by mouth 3 (three) times daily. 21 capsule 0  . Melatonin 5 MG TABS Take 5 mg by mouth at bedtime. Reported on 08/03/2015    . predniSONE (DELTASONE) 5 MG tablet Take 5 mg by mouth daily with breakfast.     No facility-administered medications prior to visit.      Allergies:   Feldene; Statins; Doxycycline; Tequin; and Valium   Social History   Social History  . Marital Status: Married    Spouse Name: N/A  . Number of Children: N/A  . Years of Education: N/A   Social History Main Topics  . Smoking status: Never Smoker   . Smokeless tobacco: Former Systems developer  . Alcohol Use: No  . Drug Use: No  . Sexual Activity: Not Asked   Other Topics Concern  . None   Social History Narrative     Family History:  The patient's family history includes Arthritis-Osteo in his sister.   ROS:   Please see the history of present illness.    Review of Systems  Hematologic/Lymphatic: Bruises/bleeds easily.  Skin: Positive for rash.   All other systems reviewed and are negative.   Physical Exam:    VS:  BP 144/70 mmHg  Pulse 56   GEN: Well nourished, well developed, in no acute distress HEENT: normal Neck: no JVD, no masses Cardiac: Normal S1/S2, RRR; no murmurs, rubs, or gallops, no edema;     Respiratory:  clear to auscultation bilaterally; no wheezing, rhonchi or rales GI: soft, nontender, nondistended MS: no deformity or atrophy Skin: warm and dry Neuro: No focal deficits  Psych: Alert and oriented x 3, normal affect  Wt Readings from Last 3 Encounters:  06/05/15 182 lb (82.555 kg)  05/06/15 184 lb (83.462 kg)  04/20/15 183 lb (83.008 kg)      Studies/Labs Reviewed:     EKG:  EKG is  ordered today.  The ekg ordered today demonstrates Sinus bradycardia, HR 56, LAD, QTc 445 ms, nonspecific ST-T wave  changes, septal Q waves, no change from prior tracing  Recent Labs: 04/20/2015: ALT 12; BUN 13; Creat 0.87; Hemoglobin 13.2; Platelets 140*; Potassium 3.6; Sodium 139; TSH 1.439   Recent Lipid Panel    Component Value Date/Time   CHOL 98 05/05/2014 0740   CHOL 97 09/09/2013 0957   TRIG 84.0 05/05/2014 0740   TRIG 92 09/09/2013 0957   HDL 33.00* 05/05/2014 0740   HDL 37*  09/09/2013 0957   CHOLHDL 3 05/05/2014 0740   VLDL 16.8 05/05/2014 0740   LDLCALC 48 05/05/2014 0740   LDLCALC 42 09/09/2013 0957    Additional studies/ records that were reviewed today include:   Holter 6/15 No sig arrhythmias  Myoview 3/12 Normal perfusion, EF 77%; Low Risk  Echo 10/09 EF 55-60%, mod LAE, Ao sclerosis, septal HK  LHC 2/09 EF 75-80% LM ok LAD occluded LCx mid stent ok; mid 30-40%; OM1 80% RCA mid 20-30% Radial-OM patent L-LAD patent   ASSESSMENT:     1. Pre-operative cardiovascular examination   2. Coronary artery disease involving native coronary artery of native heart without angina pectoris   3. Essential hypertension   4. Hyperlipidemia     PLAN:     In order of problems listed above:  1. Surgical clearance - The patient is to be scheduled for an EGD and possible esophageal dilatation. Request has been made for him to stop Plavix 7 days prior. His last PCI was in 2008. Last Myoview was in 2012. He remains quite active without symptoms to suggest angina.  The patient does not have any unstable cardiac conditions.  Upon evaluation today, he can achieve 4 METs or greater without anginal symptoms.  According to [facility] and AHA guidelines, he requires no further cardiac workup prior to his noncardiac surgery and should be at acceptable risk.  Our service is available as necessary in the perioperative period.   -  He may hold Plavix for 7 days prior to his procedure and resume it post op when felt to be safe.   2. CAD - No angina.  Continue Plavix, beta-blocker, statin.   3 HTN -  Controlled.   4. HL - Continue statin.     Medication Adjustments/Labs and Tests Ordered: Current medicines are reviewed at length with the patient today.  Concerns regarding medicines are outlined above.  Medication changes, Labs and Tests ordered today are outlined in the Patient Instructions noted below. Patient Instructions  Medication Instructions:  Your physician recommends that you continue on your current medications as directed. Please refer to the Current Medication list given to you today. If you need a refill on your cardiac medications before your next appointment, please call your pharmacy. Labwork:  NONE ORDER TODAY Testing/Procedures:  NONE ORDER TODAY Follow-Up:  Your physician wants you to follow-up in:  IN [place]Marland Kitchen You will receive a reminder letter in the mail two months in advance. If you don't receive a letter, please call our office to schedule the follow-up appointment. Any Other Special Instructions Will Be Listed Below (If Applicable).   Signed, Richardson Dopp, PA-C  08/03/2015 10:08 AM    [facility] [address], [place], [place]  [address] Phone: [phone]; Fax: [phone]

## 2015-08-03 ENCOUNTER — Encounter: Payer: Self-pay | Admitting: Physician Assistant

## 2015-08-03 ENCOUNTER — Ambulatory Visit (INDEPENDENT_AMBULATORY_CARE_PROVIDER_SITE_OTHER): Payer: Commercial Managed Care - HMO | Admitting: Physician Assistant

## 2015-08-03 ENCOUNTER — Ambulatory Visit: Payer: Medicare HMO | Admitting: Physician Assistant

## 2015-08-03 VITALS — BP 144/70 | HR 56

## 2015-08-03 DIAGNOSIS — E785 Hyperlipidemia, unspecified: Secondary | ICD-10-CM

## 2015-08-03 DIAGNOSIS — I10 Essential (primary) hypertension: Secondary | ICD-10-CM | POA: Diagnosis not present

## 2015-08-03 DIAGNOSIS — I2510 Atherosclerotic heart disease of native coronary artery without angina pectoris: Secondary | ICD-10-CM | POA: Diagnosis not present

## 2015-08-03 DIAGNOSIS — Z01810 Encounter for preprocedural cardiovascular examination: Secondary | ICD-10-CM | POA: Diagnosis not present

## 2015-08-03 NOTE — Patient Instructions (Addendum)
Medication Instructions:  Your physician recommends that you continue on your current medications as directed. Please refer to the Current Medication list given to you today. If you need a refill on your cardiac medications before your next appointment, please call your pharmacy. Labwork:  NONE ORDER TODAY Testing/Procedures:  NONE ORDER TODAY Follow-Up:  Your physician wants you to follow-up in:  IN [place].Marland Kitchen You will receive a reminder letter in the mail two months in advance. If you don't receive a letter, please call our office to schedule the follow-up appointment. Any Other Special Instructions Will Be Listed Below (If Applicable).

## 2015-08-04 ENCOUNTER — Encounter: Payer: Self-pay | Admitting: Family Medicine

## 2015-08-04 ENCOUNTER — Ambulatory Visit (INDEPENDENT_AMBULATORY_CARE_PROVIDER_SITE_OTHER): Payer: Commercial Managed Care - HMO | Admitting: Family Medicine

## 2015-08-04 VITALS — BP 100/60 | HR 62 | Temp 97.6°F | Resp 16 | Ht 68.0 in | Wt 180.0 lb

## 2015-08-04 DIAGNOSIS — L2081 Atopic neurodermatitis: Secondary | ICD-10-CM

## 2015-08-04 MED ORDER — HYDROXYZINE HCL 25 MG PO TABS
25.0000 mg | ORAL_TABLET | Freq: Three times a day (TID) | ORAL | Status: DC | PRN
Start: 2015-08-04 — End: 2015-11-03

## 2015-08-04 MED ORDER — HYDROXYZINE HCL 25 MG PO TABS: 25.0000 mg | ORAL_TABLET | Freq: Three times a day (TID) | ORAL | Status: DC | PRN

## 2015-08-04 NOTE — Progress Notes (Signed)
Subjective:    Patient ID: Evan [name], male    DOB: Jan 29, 1928, 82 y.o.   MRN: [id number]  Rash  06/05/15   patient is here today complaining of itching. This is the first time I have seen the patient for this. Recently he saw the 68 assistant who did a skin biopsy that revealed no pathologic lesion. He is subsequently seen a dermatologist twice and been prescribed cream without improvement. He has an eczematous form rash on the left side of his neck with secondary superinfection with impetigo. There is thick yellow honey colored scale overlying erythematous skin.  He has a papulosquamous eruption on the side of his neck and also itching on the volar surface of his right wrist but no visible rash in that area.  At that time, my plan was:  This is the first time I have seen the patient for this condition. He appears to have an eczematous form rash on the left side of his neck. I recommended Elocon ointment be applied 1-2 times a day for the next week to treat this rash on his neck and to help with itching. However it appears to be super infected with impetigo for which I will place the patient on Keflex 500 mg by mouth 3 times a day for 7 days. He is scheduled to follow-up with a dermatologist later this week which I encouraged him to keep that appointment.  08/04/15 The Itching has returned. He reports itching on the volar surfaces of both forearms, on his lower abdomen, on his trunk, and on his neck. There is no rash to be seen today. There are erythematous areas on his skin where he has been scratching that there is no actual rash. He saw the dermatologist who recommended a "salve" which did not help with itching. He is back today. It did get better after I saw him last time. At that time I gave him hydroxyzine and Elocon ointment. However when he stopped the medication the itching returned Past Medical History  Diagnosis Date  . CAD (coronary artery disease)   . BPH (benign prostatic  hyperplasia)   . Arthritis   . Asthma   . GERD (gastroesophageal reflux disease)   . Hypertension   . Anxiety   . History of ASCVD     MULTIVESSEL  . Anginal chest pain at rest [facility])     Chronic non, controlled on Lorazepam  . Hyperlipidemia   . DJD (degenerative joint disease)   . Hemochromatosis     Possible, elevated iron stores, hook-like osteophytes on hand films, normal LFTs  . Esophageal ulcer without bleeding     bid ppi indefinitely   Past Surgical History  Procedure Laterality Date  . Cholecystectomy    . Rotator cuff repair    . Knee surgery    . Coronary artery bypass graft    . Descending aortic aneurysm repair w/ stent      DE stent ostium into the right radial free graft at OM-, 02-2007  . Esophagogastroduodenoscopy N/A 11/09/2014    Procedure: ESOPHAGOGASTRODUODENOSCOPY (EGD);  Surgeon: [name], MD;  Location: [facility] ENDOSCOPY;  Service: Endoscopy;  Laterality: N/A;   Current Outpatient Prescriptions on File Prior to Visit  Medication Sig Dispense Refill  . atenolol (TENORMIN) 50 MG tablet TAKE 1 TABLET BY MOUTH  EVERY DAY 90 tablet 0  . clopidogrel (PLAVIX) 75 MG tablet TAKE 1 TABLET BY MOUTH  EVERY DAY 90 tablet 0  . diclofenac sodium (VOLTAREN) 1 %  GEL Apply 2 g topically daily as needed (for pain in knees and feet).    Marland Kitchen FLUoxetine (PROZAC) 20 MG capsule TAKE ONE CAPSULE BY MOUTH DAILY 90 capsule 3  . folic acid (FOLVITE) 1 MG tablet Take 1 mg by mouth daily.    . hydrocortisone 2.5 % cream APPLY TOPICALLY TWO TIMES DAILY 30 g 3  . leflunomide (ARAVA) 10 MG tablet Take 10 mg by mouth daily.     Marland Kitchen LORazepam (ATIVAN) 1 MG tablet TAKE ONE (1) TABLET BY MOUTH 3 TIMES DAILY. TAKE AT 8AM, 12 NOON, AND4PM. 90 tablet 2  . methotrexate (RHEUMATREX) 2.5 MG tablet Take 20 mg by mouth every Monday. Reported on 04/15/2015    . mometasone (ELOCON) 0.1 % ointment APPLY TO THE AFFECTED AREAS DAILY AS DIRECTED 45 g 3  . nitroGLYCERIN (NITROSTAT) 0.4 MG SL tablet Place 1 tablet  (0.4 mg total) under the tongue every 5 (five) minutes as needed for chest pain. 25 tablet 0  . pantoprazole (PROTONIX) 40 MG tablet TAKE 1 TABLET BY MOUTH EVERY DAY 90 tablet 0  . rosuvastatin (CRESTOR) 5 MG tablet TAKE 1 TABLET BY MOUTH  EVERY DAY 90 tablet 0  . sodium chloride (OCEAN) 0.65 % SOLN nasal spray Place 1 spray into both nostrils as needed for congestion. 1 Bottle 0  . tamsulosin (FLOMAX) 0.4 MG CAPS capsule TAKE 1 CAPSULE EVERY DAY 90 capsule 3  . temazepam (RESTORIL) 30 MG capsule Take 30 mg by mouth at bedtime as needed for sleep.    . traZODone (DESYREL) 50 MG tablet TAKE TWO TABLETS BY MOUTH AT BEDTIME AS NEEDED FOR SLEEP 60 tablet 3   No current facility-administered medications on file prior to visit.   Allergies  Allergen Reactions  . Feldene [Piroxicam] Other (See Comments)    REACTION: Blisters  . Statins Other (See Comments)    Myalgias  . Doxycycline Other (See Comments)    REACTION:  unknown  . Tequin Anxiety  . Valium Other (See Comments)    REACTION:  unknown   Social History   Social History  . Marital Status: Married    Spouse Name: N/A  . Number of Children: N/A  . Years of Education: N/A   Occupational History  . Not on file.   Social History Main Topics  . Smoking status: Never Smoker   . Smokeless tobacco: Former Systems developer  . Alcohol Use: No  . Drug Use: No  . Sexual Activity: Not on file   Other Topics Concern  . Not on file   Social History Narrative      Review of Systems  Skin: Positive for rash.  All other systems reviewed and are negative.      Objective:   Physical Exam  Cardiovascular: Normal rate and regular rhythm.   Pulmonary/Chest: Effort normal and breath sounds normal.  Skin: No rash noted. There is erythema.  Vitals reviewed.         Assessment & Plan:  Atopic neurodermatitis - Plan: hydrOXYzine (ATARAX/VISTARIL) 25 MG tablet, DISCONTINUED: hydrOXYzine (ATARAX/VISTARIL) 25 MG tablet  I explained to the  patient that I believe the itching is due to degeneration of the nerves in his skin giving a diffuse itching sensation without a rash. I see no evidence of scabies and none of his close contacts have similar imaging. Therefore I believe we need to focus on symptom management rather than cure. I have recommended hydroxyzine 25 mg every 8 hours as needed for itching. We  need to monitor for sedation, constipation, and confusion on the medication.

## 2015-08-07 ENCOUNTER — Telehealth: Payer: Self-pay | Admitting: Family Medicine

## 2015-08-07 NOTE — Telephone Encounter (Signed)
[employer] referral for Dr Larae Grooms, MD (cardiology)  For 6 visitis 08/03/15 - 01/30/16 Dx I25.10, Z95.1 and Z01.810  Auth # [id number]

## 2015-08-11 DIAGNOSIS — L308 Other specified dermatitis: Secondary | ICD-10-CM | POA: Diagnosis not present

## 2015-08-11 DIAGNOSIS — L2389 Allergic contact dermatitis due to other agents: Secondary | ICD-10-CM | POA: Diagnosis not present

## 2015-08-18 ENCOUNTER — Other Ambulatory Visit: Payer: Self-pay | Admitting: Family Medicine

## 2015-08-18 NOTE — Telephone Encounter (Signed)
Ok to refill??  Last office visit 08/04/2015.  Last refill  05/19/2015, #2 refills.

## 2015-08-18 NOTE — Telephone Encounter (Signed)
ok 

## 2015-08-18 NOTE — Telephone Encounter (Signed)
Medication called to pharmacy. 

## 2015-08-20 ENCOUNTER — Ambulatory Visit (INDEPENDENT_AMBULATORY_CARE_PROVIDER_SITE_OTHER): Payer: Commercial Managed Care - HMO | Admitting: Family Medicine

## 2015-08-20 ENCOUNTER — Encounter: Payer: Self-pay | Admitting: Family Medicine

## 2015-08-20 VITALS — BP 132/76 | HR 96 | Temp 97.8°F | Resp 20 | Wt 180.0 lb

## 2015-08-20 DIAGNOSIS — L299 Pruritus, unspecified: Secondary | ICD-10-CM | POA: Diagnosis not present

## 2015-08-20 MED ORDER — LIFITEGRAST 5 % OP SOLN: 1.0000 [drp] | Freq: Two times a day (BID) | OPHTHALMIC | Status: DC

## 2015-08-20 NOTE — Progress Notes (Signed)
Subjective:    Patient ID: Evan [name], male    DOB: [birth year], 82 y.o.   MRN: [id number]  Rash  06/05/15   patient is here today complaining of itching. This is the first time I have seen the patient for this. Recently he saw the 67 assistant who did a skin biopsy that revealed no pathologic lesion. He is subsequently seen a dermatologist twice and been prescribed cream without improvement. He has an eczematous form rash on the left side of his neck with secondary superinfection with impetigo. There is thick yellow honey colored scale overlying erythematous skin.  He has a papulosquamous eruption on the side of his neck and also itching on the volar surface of his right wrist but no visible rash in that area.  At that time, my plan was:  This is the first time I have seen the patient for this condition. He appears to have an eczematous form rash on the left side of his neck. I recommended Elocon ointment be applied 1-2 times a day for the next week to treat this rash on his neck and to help with itching. However it appears to be super infected with impetigo for which I will place the patient on Keflex 500 mg by mouth 3 times a day for 7 days. He is scheduled to follow-up with a dermatologist later this week which I encouraged him to keep that appointment.  08/04/15 The Itching has returned. He reports itching on the volar surfaces of both forearms, on his lower abdomen, on his trunk, and on his neck. There is no rash to be seen today. There are erythematous areas on his skin where he has been scratching that there is no actual rash. He saw the dermatologist who recommended a "salve" which did not help with itching. He is back today. It did get better after I saw him last time. At that time I gave him hydroxyzine and Elocon ointment. However when he stopped the medication the itching returned.  At that time, ,my plan was:  I explained to the patient that I believe the itching is due to  degeneration of the nerves in his skin giving a diffuse itching sensation without a rash. I see no evidence of scabies and none of his close contacts have similar imaging. Therefore I believe we need to focus on symptom management rather than cure. I have recommended hydroxyzine 25 mg every 8 hours as needed for itching. We need to monitor for sedation, constipation, and confusion on the medication.  08/20/15 Patient states that the itching is worsening. He has numerous ecchymoses and purpura on his forearms on his chest on his left upper chest and on his left abdomen from scratching. He also has erythema in the skin underneath his left breast and on his left abdomen and also on his left forearm where he is scratching. Throughout office visit he subconsciously is constantly scratching at his right forearm. He is only taking hydroxyzine 25 mg once a day at night. He is unsure if it's helping. Dermatology has tried him on betamethasone cream with no relief. They have also tried him on oral prednisone with no relief. He has an appointment to see him on Monday for phototherapy. Last lab work was in January Past Medical History  Diagnosis Date  . CAD (coronary artery disease)   . BPH (benign prostatic hyperplasia)   . Arthritis   . Asthma   . GERD (gastroesophageal reflux disease)   . Hypertension   .  Anxiety   . History of ASCVD     MULTIVESSEL  . Anginal chest pain at rest [facility])     Chronic non, controlled on Lorazepam  . Hyperlipidemia   . DJD (degenerative joint disease)   . Hemochromatosis     Possible, elevated iron stores, hook-like osteophytes on hand films, normal LFTs  . Esophageal ulcer without bleeding     bid ppi indefinitely   Past Surgical History  Procedure Laterality Date  . Cholecystectomy    . Rotator cuff repair    . Knee surgery    . Coronary artery bypass graft    . Descending aortic aneurysm repair w/ stent      DE stent ostium into the right radial free graft at OM-,  02-2007  . Esophagogastroduodenoscopy N/A 11/09/2014    Procedure: ESOPHAGOGASTRODUODENOSCOPY (EGD);  Surgeon: [name], MD;  Location: [facility] ENDOSCOPY;  Service: Endoscopy;  Laterality: N/A;   Current Outpatient Prescriptions on File Prior to Visit  Medication Sig Dispense Refill  . atenolol (TENORMIN) 50 MG tablet TAKE 1 TABLET BY MOUTH  EVERY DAY 90 tablet 0  . clopidogrel (PLAVIX) 75 MG tablet TAKE 1 TABLET BY MOUTH  EVERY DAY 90 tablet 0  . diclofenac sodium (VOLTAREN) 1 % GEL Apply 2 g topically daily as needed (for pain in knees and feet).    Marland Kitchen FLUoxetine (PROZAC) 20 MG capsule TAKE ONE CAPSULE BY MOUTH DAILY 90 capsule 3  . folic acid (FOLVITE) 1 MG tablet Take 1 mg by mouth daily.    . hydrocortisone 2.5 % cream APPLY TOPICALLY TWO TIMES DAILY 30 g 3  . hydrOXYzine (ATARAX/VISTARIL) 25 MG tablet Take 1 tablet (25 mg total) by mouth every 8 (eight) hours as needed for itching. 60 tablet 1  . leflunomide (ARAVA) 10 MG tablet Take 10 mg by mouth daily.     Marland Kitchen LORazepam (ATIVAN) 1 MG tablet TAKE ONE (1) TABLET BY MOUTH 3 TIMES DAILY. TAKE AT 8AM, 12 NOON, AND4PM. 90 tablet 2  . mometasone (ELOCON) 0.1 % ointment APPLY TO THE AFFECTED AREAS DAILY AS DIRECTED 45 g 3  . nitroGLYCERIN (NITROSTAT) 0.4 MG SL tablet Place 1 tablet (0.4 mg total) under the tongue every 5 (five) minutes as needed for chest pain. 25 tablet 0  . pantoprazole (PROTONIX) 40 MG tablet TAKE 1 TABLET BY MOUTH EVERY DAY 90 tablet 0  . rosuvastatin (CRESTOR) 5 MG tablet TAKE 1 TABLET BY MOUTH  EVERY DAY 90 tablet 0  . sodium chloride (OCEAN) 0.65 % SOLN nasal spray Place 1 spray into both nostrils as needed for congestion. 1 Bottle 0  . tamsulosin (FLOMAX) 0.4 MG CAPS capsule TAKE 1 CAPSULE EVERY DAY 90 capsule 3  . temazepam (RESTORIL) 30 MG capsule Take 30 mg by mouth at bedtime as needed for sleep.    . traZODone (DESYREL) 50 MG tablet TAKE TWO TABLETS BY MOUTH AT BEDTIME AS NEEDED FOR SLEEP 60 tablet 3   No current  facility-administered medications on file prior to visit.   Allergies  Allergen Reactions  . Feldene [Piroxicam] Other (See Comments)    REACTION: Blisters  . Statins Other (See Comments)    Myalgias  . Doxycycline Other (See Comments)    REACTION:  unknown  . Tequin Anxiety  . Valium Other (See Comments)    REACTION:  unknown   Social History   Social History  . Marital Status: Married    Spouse Name: N/A  . Number of Children: N/A  .  Years of Education: N/A   Occupational History  . Not on file.   Social History Main Topics  . Smoking status: Never Smoker   . Smokeless tobacco: Former Systems developer  . Alcohol Use: No  . Drug Use: No  . Sexual Activity: Not on file   Other Topics Concern  . Not on file   Social History Narrative      Review of Systems  Skin: Positive for rash.  All other systems reviewed and are negative.      Objective:   Physical Exam  Cardiovascular: Normal rate and regular rhythm.   Pulmonary/Chest: Effort normal and breath sounds normal.  Skin: No rash noted. There is erythema.  Vitals reviewed.         Assessment & Plan:  Pruritus - Plan: COMPLETE METABOLIC PANEL WITH GFR, CBC with Differential/Platelet I spent 20 minutes reviewing his medication list. Certainly rash is listed for the majority of drugs however the only medication he is taking that I feel could potentially cause this rash and itching would be the arava.. He started this medication after the rash therefore I do not believe is the causative agent. None of his family members or close contacts have a rash and therefore I believe scabies is unlikely. He denies any bed bugs. We'll check a CBC today to evaluate for polycythemia. On my check a CMP today to evaluate for possible liver kidney problems that can potentially cause pruritus. I have recommended he increase the hydroxyzine 3 times a day. I've also recommended he start adding Vaseline to the skin as a barrier emollient until  he is seen by dermatology on Monday. Unfortunately, I am out of ideas as to what to do for this patient's itching.

## 2015-08-21 DIAGNOSIS — B3781 Candidal esophagitis: Secondary | ICD-10-CM | POA: Diagnosis not present

## 2015-08-21 DIAGNOSIS — R1310 Dysphagia, unspecified: Secondary | ICD-10-CM | POA: Diagnosis not present

## 2015-08-21 DIAGNOSIS — K222 Esophageal obstruction: Secondary | ICD-10-CM | POA: Diagnosis not present

## 2015-08-21 LAB — COMPLETE METABOLIC PANEL WITH GFR
ALT: 9 U/L (ref 9–46)
AST: 17 U/L (ref 10–35)
Albumin: 3.9 g/dL (ref 3.6–5.1)
Alkaline Phosphatase: 66 U/L (ref 40–115)
BUN: 21 mg/dL (ref 7–25)
CO2: 22 mmol/L (ref 20–31)
Calcium: 8.8 mg/dL (ref 8.6–10.3)
Chloride: 103 mmol/L (ref 98–110)
Creat: 1 mg/dL (ref 0.70–1.11)
GFR, Est African American: 79 mL/min (ref >=60)
GFR, Est Non African American: 68 mL/min (ref >=60)
Glucose, Bld: 99 mg/dL (ref 70–99)
Potassium: 4.2 mmol/L (ref 3.5–5.3)
Sodium: 137 mmol/L (ref 135–146)
Total Bilirubin: 0.8 mg/dL (ref 0.2–1.2)
Total Protein: 5.9 g/dL — ABNORMAL LOW (ref 6.1–8.1)

## 2015-08-21 LAB — CBC WITH DIFFERENTIAL/PLATELET
Basophils Absolute: 0 cells/uL (ref 0–200)
Basophils Relative: 0 %
Eosinophils Absolute: 98 cells/uL (ref 15–500)
Eosinophils Relative: 1 %
HCT: 42.7 % (ref 38.5–50.0)
Hemoglobin: 14.1 g/dL (ref 13.0–17.0)
Lymphocytes Relative: 13 %
Lymphs Abs: 1274 cells/uL (ref 850–3900)
MCH: 31.2 pg (ref 27.0–33.0)
MCHC: 33 g/dL (ref 32.0–36.0)
MCV: 94.5 fL (ref 80.0–100.0)
MPV: 11.2 fL (ref 7.5–12.5)
Monocytes Absolute: 1274 cells/uL — ABNORMAL HIGH (ref 200–950)
Monocytes Relative: 13 %
Neutro Abs: 7154 cells/uL (ref 1500–7800)
Neutrophils Relative %: 73 %
Platelets: 163 10*3/uL (ref 140–400)
RBC: 4.52 MIL/uL (ref 4.20–5.80)
RDW: 12.8 % (ref 11.0–15.0)
WBC: 9.8 10*3/uL (ref 3.8–10.8)

## 2015-08-24 DIAGNOSIS — L308 Other specified dermatitis: Secondary | ICD-10-CM | POA: Diagnosis not present

## 2015-08-27 ENCOUNTER — Telehealth: Payer: Self-pay | Admitting: *Deleted

## 2015-08-27 NOTE — Telephone Encounter (Signed)
Received request from pharmacy for [place] on [address].   PA submitted.   Dx: C80..129- dry eye syndrome

## 2015-08-28 NOTE — Telephone Encounter (Signed)
Received PA determination.   PA denied.  

## 2015-08-28 NOTE — Telephone Encounter (Signed)
Call placed to patient. LMTRC.  

## 2015-08-28 NOTE — Telephone Encounter (Signed)
Please notify patient's daughter Casper Harrison.  He can try artificial tears.

## 2015-09-01 NOTE — Telephone Encounter (Signed)
Call placed to patient and patient daughter [name] made aware.

## 2015-09-02 DIAGNOSIS — R21 Rash and other nonspecific skin eruption: Secondary | ICD-10-CM | POA: Diagnosis not present

## 2015-09-02 HISTORY — PX: ARM SKIN LESION BIOPSY / EXCISION: SUR471

## 2015-09-04 ENCOUNTER — Emergency Department (HOSPITAL_COMMUNITY): Payer: Commercial Managed Care - HMO

## 2015-09-04 ENCOUNTER — Encounter (HOSPITAL_COMMUNITY): Payer: Self-pay | Admitting: Emergency Medicine

## 2015-09-04 ENCOUNTER — Inpatient Hospital Stay (HOSPITAL_COMMUNITY)
Admission: EM | Admit: 2015-09-04 | Discharge: 2015-09-10 | DRG: 602 | Disposition: A | Discharge status: Home / Self Care | Payer: Commercial Managed Care - HMO | Attending: Internal Medicine | Admitting: Internal Medicine

## 2015-09-04 DIAGNOSIS — L131 Subcorneal pustular dermatitis: Secondary | ICD-10-CM | POA: Diagnosis not present

## 2015-09-04 DIAGNOSIS — G9341 Metabolic encephalopathy: Secondary | ICD-10-CM

## 2015-09-04 DIAGNOSIS — R4182 Altered mental status, unspecified: Secondary | ICD-10-CM | POA: Diagnosis not present

## 2015-09-04 DIAGNOSIS — N179 Acute kidney failure, unspecified: Secondary | ICD-10-CM | POA: Diagnosis not present

## 2015-09-04 DIAGNOSIS — Z888 Allergy status to other drugs, medicaments and biological substances status: Secondary | ICD-10-CM

## 2015-09-04 DIAGNOSIS — Z8673 Personal history of transient ischemic attack (TIA), and cerebral infarction without residual deficits: Secondary | ICD-10-CM

## 2015-09-04 DIAGNOSIS — Z881 Allergy status to other antibiotic agents status: Secondary | ICD-10-CM

## 2015-09-04 DIAGNOSIS — Z9049 Acquired absence of other specified parts of digestive tract: Secondary | ICD-10-CM | POA: Diagnosis not present

## 2015-09-04 DIAGNOSIS — J189 Pneumonia, unspecified organism: Secondary | ICD-10-CM | POA: Diagnosis not present

## 2015-09-04 DIAGNOSIS — Z8719 Personal history of other diseases of the digestive system: Secondary | ICD-10-CM | POA: Diagnosis not present

## 2015-09-04 DIAGNOSIS — L0390 Cellulitis, unspecified: Secondary | ICD-10-CM | POA: Diagnosis present

## 2015-09-04 DIAGNOSIS — R011 Cardiac murmur, unspecified: Secondary | ICD-10-CM | POA: Diagnosis present

## 2015-09-04 DIAGNOSIS — R079 Chest pain, unspecified: Secondary | ICD-10-CM | POA: Diagnosis not present

## 2015-09-04 DIAGNOSIS — F419 Anxiety disorder, unspecified: Secondary | ICD-10-CM | POA: Diagnosis present

## 2015-09-04 DIAGNOSIS — E785 Hyperlipidemia, unspecified: Secondary | ICD-10-CM | POA: Diagnosis present

## 2015-09-04 DIAGNOSIS — K219 Gastro-esophageal reflux disease without esophagitis: Secondary | ICD-10-CM | POA: Diagnosis present

## 2015-09-04 DIAGNOSIS — Z79899 Other long term (current) drug therapy: Secondary | ICD-10-CM

## 2015-09-04 DIAGNOSIS — I119 Hypertensive heart disease without heart failure: Secondary | ICD-10-CM | POA: Diagnosis present

## 2015-09-04 DIAGNOSIS — Z955 Presence of coronary angioplasty implant and graft: Secondary | ICD-10-CM | POA: Diagnosis not present

## 2015-09-04 DIAGNOSIS — M069 Rheumatoid arthritis, unspecified: Secondary | ICD-10-CM | POA: Diagnosis present

## 2015-09-04 DIAGNOSIS — I10 Essential (primary) hypertension: Secondary | ICD-10-CM | POA: Insufficient documentation

## 2015-09-04 DIAGNOSIS — R072 Precordial pain: Secondary | ICD-10-CM | POA: Diagnosis not present

## 2015-09-04 DIAGNOSIS — Z96653 Presence of artificial knee joint, bilateral: Secondary | ICD-10-CM | POA: Diagnosis present

## 2015-09-04 DIAGNOSIS — Z7952 Long term (current) use of systemic steroids: Secondary | ICD-10-CM

## 2015-09-04 DIAGNOSIS — L03114 Cellulitis of left upper limb: Secondary | ICD-10-CM | POA: Diagnosis present

## 2015-09-04 DIAGNOSIS — E860 Dehydration: Secondary | ICD-10-CM | POA: Diagnosis present

## 2015-09-04 DIAGNOSIS — Z7902 Long term (current) use of antithrombotics/antiplatelets: Secondary | ICD-10-CM | POA: Diagnosis not present

## 2015-09-04 DIAGNOSIS — N400 Enlarged prostate without lower urinary tract symptoms: Secondary | ICD-10-CM | POA: Diagnosis present

## 2015-09-04 DIAGNOSIS — I200 Unstable angina: Secondary | ICD-10-CM | POA: Diagnosis not present

## 2015-09-04 DIAGNOSIS — D696 Thrombocytopenia, unspecified: Secondary | ICD-10-CM | POA: Diagnosis present

## 2015-09-04 DIAGNOSIS — Z87891 Personal history of nicotine dependence: Secondary | ICD-10-CM | POA: Diagnosis not present

## 2015-09-04 DIAGNOSIS — F329 Major depressive disorder, single episode, unspecified: Secondary | ICD-10-CM | POA: Diagnosis present

## 2015-09-04 DIAGNOSIS — I2510 Atherosclerotic heart disease of native coronary artery without angina pectoris: Secondary | ICD-10-CM | POA: Diagnosis present

## 2015-09-04 DIAGNOSIS — L03112 Cellulitis of left axilla: Secondary | ICD-10-CM | POA: Diagnosis not present

## 2015-09-04 DIAGNOSIS — Z951 Presence of aortocoronary bypass graft: Secondary | ICD-10-CM | POA: Diagnosis not present

## 2015-09-04 DIAGNOSIS — I959 Hypotension, unspecified: Secondary | ICD-10-CM | POA: Insufficient documentation

## 2015-09-04 DIAGNOSIS — I25110 Atherosclerotic heart disease of native coronary artery with unstable angina pectoris: Secondary | ICD-10-CM | POA: Diagnosis not present

## 2015-09-04 DIAGNOSIS — R930 Abnormal findings on diagnostic imaging of skull and head, not elsewhere classified: Secondary | ICD-10-CM | POA: Diagnosis not present

## 2015-09-04 DIAGNOSIS — B957 Other staphylococcus as the cause of diseases classified elsewhere: Secondary | ICD-10-CM | POA: Diagnosis not present

## 2015-09-04 HISTORY — DX: Adverse effect of unspecified anesthetic, initial encounter: T41.45XA

## 2015-09-04 HISTORY — DX: Low back pain: M54.5

## 2015-09-04 HISTORY — DX: Headache, unspecified: R51.9

## 2015-09-04 HISTORY — DX: Inflammatory liver disease, unspecified: K75.9

## 2015-09-04 HISTORY — DX: Pneumonia, unspecified organism: J18.9

## 2015-09-04 HISTORY — DX: Major depressive disorder, single episode, unspecified: F32.9

## 2015-09-04 HISTORY — DX: Other chronic pain: G89.29

## 2015-09-04 HISTORY — DX: Family history of other specified conditions: Z84.89

## 2015-09-04 HISTORY — DX: Low back pain, unspecified: M54.50

## 2015-09-04 HISTORY — DX: Depression, unspecified: F32.A

## 2015-09-04 HISTORY — DX: Rheumatoid arthritis, unspecified: M06.9

## 2015-09-04 HISTORY — DX: Other complications of anesthesia, initial encounter: T88.59XA

## 2015-09-04 HISTORY — DX: Unspecified osteoarthritis, unspecified site: M19.90

## 2015-09-04 HISTORY — DX: Acute myocardial infarction, unspecified: I21.9

## 2015-09-04 HISTORY — DX: Headache: R51

## 2015-09-04 LAB — CBC WITH DIFFERENTIAL/PLATELET
Basophils Absolute: 0 10*3/uL (ref 0.0–0.1)
Basophils Relative: 0 %
Eosinophils Absolute: 0.1 10*3/uL (ref 0.0–0.7)
Eosinophils Relative: 1 %
HCT: 45.7 % (ref 39.0–52.0)
Hemoglobin: 14.5 g/dL (ref 13.0–17.0)
Lymphocytes Relative: 16 %
Lymphs Abs: 1.7 10*3/uL (ref 0.7–4.0)
MCH: 30.4 pg (ref 26.0–34.0)
MCHC: 31.7 g/dL (ref 30.0–36.0)
MCV: 95.8 fL (ref 78.0–100.0)
Monocytes Absolute: 1.1 10*3/uL — ABNORMAL HIGH (ref 0.1–1.0)
Monocytes Relative: 10 %
Neutro Abs: 7.9 10*3/uL — ABNORMAL HIGH (ref 1.7–7.7)
Neutrophils Relative %: 73 %
Platelets: 143 10*3/uL — ABNORMAL LOW (ref 150–400)
RBC: 4.77 MIL/uL (ref 4.22–5.81)
RDW: 13.1 % (ref 11.5–15.5)
WBC: 10.9 10*3/uL — ABNORMAL HIGH (ref 4.0–10.5)

## 2015-09-04 LAB — COMPREHENSIVE METABOLIC PANEL
ALT: 14 U/L — ABNORMAL LOW (ref 17–63)
AST: 23 U/L (ref 15–41)
Albumin: 3.3 g/dL — ABNORMAL LOW (ref 3.5–5.0)
Alkaline Phosphatase: 69 U/L (ref 38–126)
Anion gap: 8 (ref 5–15)
BUN: 19 mg/dL (ref 6–20)
CO2: 24 mmol/L (ref 22–32)
Calcium: 8.7 mg/dL — ABNORMAL LOW (ref 8.9–10.3)
Chloride: 105 mmol/L (ref 101–111)
Creatinine, Ser: 1.42 mg/dL — ABNORMAL HIGH (ref 0.61–1.24)
GFR calc Af Amer: 50 mL/min — ABNORMAL LOW (ref >60)
GFR calc non Af Amer: 43 mL/min — ABNORMAL LOW (ref >60)
Glucose, Bld: 143 mg/dL — ABNORMAL HIGH (ref 65–99)
Potassium: 4.6 mmol/L (ref 3.5–5.1)
Sodium: 137 mmol/L (ref 135–145)
Total Bilirubin: 1.6 mg/dL — ABNORMAL HIGH (ref 0.3–1.2)
Total Protein: 6.1 g/dL — ABNORMAL LOW (ref 6.5–8.1)

## 2015-09-04 LAB — I-STAT CG4 LACTIC ACID, ED
Lactic Acid, Venous: 3.12 mmol/L (ref 0.5–2.0)
Lactic Acid, Venous: 0.8 mmol/L (ref 0.5–2.0)

## 2015-09-04 MED ORDER — PIPERACILLIN-TAZOBACTAM 3.375 G IVPB
3.3750 g | Freq: Three times a day (TID) | INTRAVENOUS | Status: DC
  Administered 2015-09-04 – 2015-09-05 (×3): 3.375 g via INTRAVENOUS
  Filled 2015-09-04 (×6): qty 50

## 2015-09-04 MED ORDER — DEXTROSE 5 % IV SOLN
500.0000 mg | Freq: Once | INTRAVENOUS | Status: AC
  Administered 2015-09-04: 500 mg via INTRAVENOUS
  Filled 2015-09-04: qty 500

## 2015-09-04 MED ORDER — LIFITEGRAST 5 % OP SOLN: 1.0000 [drp] | Freq: Two times a day (BID) | OPHTHALMIC | Status: DC

## 2015-09-04 MED ORDER — DEXTROSE 5 % IV SOLN
1.0000 g | Freq: Once | INTRAVENOUS | Status: AC
  Administered 2015-09-04: 1 g via INTRAVENOUS
  Filled 2015-09-04: qty 10

## 2015-09-04 MED ORDER — VANCOMYCIN HCL 10 G IV SOLR
1250.0000 mg | INTRAVENOUS | Status: DC
  Administered 2015-09-05 – 2015-09-07 (×3): 1250 mg via INTRAVENOUS
  Filled 2015-09-04 (×4): qty 1250

## 2015-09-04 MED ORDER — TRAZODONE HCL 50 MG PO TABS
50.0000 mg | ORAL_TABLET | Freq: Every evening | ORAL | Status: DC | PRN
  Administered 2015-09-05 – 2015-09-08 (×3): 50 mg via ORAL
  Filled 2015-09-04 (×3): qty 1

## 2015-09-04 MED ORDER — ONDANSETRON HCL 4 MG PO TABS: 4.0000 mg | ORAL_TABLET | Freq: Four times a day (QID) | ORAL | Status: DC | PRN

## 2015-09-04 MED ORDER — ATENOLOL 50 MG PO TABS
50.0000 mg | ORAL_TABLET | Freq: Every day | ORAL | Status: DC
  Administered 2015-09-04 – 2015-09-08 (×5): 50 mg via ORAL
  Filled 2015-09-04 (×5): qty 1

## 2015-09-04 MED ORDER — MAGNESIUM CITRATE PO SOLN: 1.0000 | Freq: Once | ORAL | Status: DC | PRN

## 2015-09-04 MED ORDER — HYDROXYZINE HCL 25 MG PO TABS
25.0000 mg | ORAL_TABLET | Freq: Three times a day (TID) | ORAL | Status: DC | PRN
  Administered 2015-09-06 – 2015-09-10 (×6): 25 mg via ORAL
  Filled 2015-09-04 (×6): qty 1

## 2015-09-04 MED ORDER — HEPARIN SODIUM (PORCINE) 5000 UNIT/ML IJ SOLN
5000.0000 [IU] | Freq: Three times a day (TID) | INTRAMUSCULAR | Status: DC
  Administered 2015-09-04 – 2015-09-06 (×6): 5000 [IU] via SUBCUTANEOUS
  Filled 2015-09-04 (×6): qty 1

## 2015-09-04 MED ORDER — PANTOPRAZOLE SODIUM 40 MG PO TBEC
40.0000 mg | DELAYED_RELEASE_TABLET | Freq: Two times a day (BID) | ORAL | Status: DC
  Administered 2015-09-04 – 2015-09-10 (×13): 40 mg via ORAL
  Filled 2015-09-04 (×13): qty 1

## 2015-09-04 MED ORDER — SODIUM CHLORIDE 0.9 % IV BOLUS (SEPSIS)
500.0000 mL | Freq: Once | INTRAVENOUS | Status: AC
  Administered 2015-09-04: 500 mL via INTRAVENOUS

## 2015-09-04 MED ORDER — PIPERACILLIN-TAZOBACTAM 3.375 G IVPB 30 MIN
3.3750 g | Freq: Once | INTRAVENOUS | Status: AC
  Administered 2015-09-04: 3.375 g via INTRAVENOUS
  Filled 2015-09-04: qty 50

## 2015-09-04 MED ORDER — CLOPIDOGREL BISULFATE 75 MG PO TABS
75.0000 mg | ORAL_TABLET | Freq: Every day | ORAL | Status: DC
  Administered 2015-09-04 – 2015-09-10 (×7): 75 mg via ORAL
  Filled 2015-09-04 (×7): qty 1

## 2015-09-04 MED ORDER — TAMSULOSIN HCL 0.4 MG PO CAPS
0.4000 mg | ORAL_CAPSULE | Freq: Every day | ORAL | Status: DC
  Administered 2015-09-04: 0.4 mg via ORAL
  Filled 2015-09-04 (×2): qty 1

## 2015-09-04 MED ORDER — ACETAMINOPHEN 325 MG PO TABS: 650.0000 mg | ORAL_TABLET | Freq: Four times a day (QID) | ORAL | Status: DC | PRN

## 2015-09-04 MED ORDER — FLUOXETINE HCL 20 MG PO CAPS
20.0000 mg | ORAL_CAPSULE | Freq: Every day | ORAL | Status: DC
  Administered 2015-09-04 – 2015-09-10 (×7): 20 mg via ORAL
  Filled 2015-09-04 (×7): qty 1

## 2015-09-04 MED ORDER — ONDANSETRON HCL 4 MG/2ML IJ SOLN: 4.0000 mg | Freq: Four times a day (QID) | INTRAMUSCULAR | Status: DC | PRN

## 2015-09-04 MED ORDER — SODIUM CHLORIDE 0.9 % IV SOLN
INTRAVENOUS | Status: DC
  Administered 2015-09-04 – 2015-09-07 (×2): via INTRAVENOUS

## 2015-09-04 MED ORDER — SODIUM CHLORIDE 0.9 % IV BOLUS (SEPSIS)
1000.0000 mL | Freq: Once | INTRAVENOUS | Status: AC
  Administered 2015-09-04: 1000 mL via INTRAVENOUS

## 2015-09-04 MED ORDER — ACETAMINOPHEN 650 MG RE SUPP: 650.0000 mg | Freq: Four times a day (QID) | RECTAL | Status: DC | PRN

## 2015-09-04 MED ORDER — MORPHINE SULFATE (PF) 2 MG/ML IV SOLN: 1.0000 mg | INTRAVENOUS | Status: DC | PRN

## 2015-09-04 MED ORDER — HYDROCODONE-ACETAMINOPHEN 5-325 MG PO TABS
1.0000 | ORAL_TABLET | ORAL | Status: DC | PRN
  Administered 2015-09-04 – 2015-09-05 (×2): 1 via ORAL
  Administered 2015-09-05: 2 via ORAL
  Administered 2015-09-06: 1 via ORAL
  Administered 2015-09-06 – 2015-09-08 (×5): 2 via ORAL
  Administered 2015-09-09: 1 via ORAL
  Filled 2015-09-04: qty 2
  Filled 2015-09-04: qty 1
  Filled 2015-09-04 (×2): qty 2
  Filled 2015-09-04: qty 1
  Filled 2015-09-04 (×4): qty 2
  Filled 2015-09-04: qty 1

## 2015-09-04 MED ORDER — VANCOMYCIN HCL 10 G IV SOLR
1500.0000 mg | Freq: Once | INTRAVENOUS | Status: AC
  Administered 2015-09-04: 1500 mg via INTRAVENOUS
  Filled 2015-09-04 (×2): qty 1500

## 2015-09-04 MED ORDER — BISACODYL 10 MG RE SUPP: 10.0000 mg | Freq: Every day | RECTAL | Status: DC | PRN

## 2015-09-04 MED ORDER — SENNOSIDES-DOCUSATE SODIUM 8.6-50 MG PO TABS: 1.0000 | ORAL_TABLET | Freq: Every evening | ORAL | Status: DC | PRN

## 2015-09-04 NOTE — ED Notes (Signed)
Pt in CT during hourly round

## 2015-09-04 NOTE — ED Notes (Signed)
Pt from home with c/o weakness that began 3 days ago. Pt has had "rash" from L armpit to LFA, sees dermatologist for this, and started Keflex 2 days ago. Drainage to LFA. Pt reports increased weakness and rash spread under L armpit that began this morning. Alert, VSS.

## 2015-09-04 NOTE — Progress Notes (Signed)
ANTIBIOTIC CONSULT NOTE - INITIAL  Pharmacy Consult for vancomycin, zosyn Indication: cellulitis  Allergies  Allergen Reactions  . Feldene [Piroxicam] Other (See Comments)    REACTION: Blisters  . Statins Other (See Comments)    Myalgias  . Doxycycline Other (See Comments)    REACTION:  unknown  . Tequin Anxiety  . Valium Other (See Comments)    REACTION:  unknown    Patient Measurements:   Adjusted Body Weight:   Vital Signs: BP: 163/91 mmHg (06/02 1445) Pulse Rate: 85 (06/02 1445) Intake/Output from previous day:   Intake/Output from this shift: Total I/O In: 1500 [I.V.:1500] Out: -   Labs:  Recent Labs  09/04/15 0845  WBC 10.9*  HGB 14.5  PLT 143*  CREATININE 1.42*   CrCl cannot be calculated (Unknown ideal weight.). No results for input(s): VANCOTROUGH, VANCOPEAK, VANCORANDOM, GENTTROUGH, GENTPEAK, GENTRANDOM, TOBRATROUGH, TOBRAPEAK, TOBRARND, AMIKACINPEAK, AMIKACINTROU, AMIKACIN in the last 72 hours.   Microbiology: Recent Results (from the past 720 hour(s))  Blood culture (routine x 2)     Status: None (Preliminary result)   Collection Time: 09/04/15  8:55 AM  Result Value Ref Range Status   Specimen Description BLOOD RIGHT ARM  Final   Special Requests BOTTLES DRAWN AEROBIC AND ANAEROBIC  5CC  Final   Culture PENDING  Incomplete   Report Status PENDING  Incomplete    Medical History: Past Medical History  Diagnosis Date  . CAD (coronary artery disease)   . BPH (benign prostatic hyperplasia)   . Arthritis   . Asthma   . GERD (gastroesophageal reflux disease)   . Hypertension   . Anxiety   . History of ASCVD     MULTIVESSEL  . Anginal chest pain at rest [facility])     Chronic non, controlled on Lorazepam  . Hyperlipidemia   . DJD (degenerative joint disease)   . Hemochromatosis     Possible, elevated iron stores, hook-like osteophytes on hand films, normal LFTs  . Esophageal ulcer without bleeding     bid ppi indefinitely    Medications:   See EMR   Assessment: 82 yo male admitted with generalized weakness, s/p fall. Pt with slight AKI, SCr 1 > 1.4, eCrCl 30-40 ml/min. Starting abx for cellulitis of L arm and axilla, he started Keflex yesterday as an outpatient.  LA 3 > 0.8.  Goal of Therapy:  Vancomycin trough level 10-15 mcg/ml   Plan:  -Vancomycin 1500 mg IV x1 followed by 1250 mg IV q24h -Zosyn 3.375 g IV q8h -Monitor renal fx, cultures, VT as needed   Harvel Quale 09/04/2015,3:39 PM

## 2015-09-04 NOTE — ED Notes (Signed)
Pt taken to CT scan.

## 2015-09-04 NOTE — H&P (Signed)
History and Physical    Evan [name] IDP:[id number] DOB: 05/04/1927 DOA: 09/04/2015   PCP: Odette Fraction, MD   Patient coming from:  Home  Chief Complaint: Generalized weakness   HPI: Evan [name] is a 82 y.o. male   presenting with 3 day history of generalized weakness, increasing difficulty walking resulting in a fall yesterday, but without LOC or injury. Mild  is reported by his daughter,as patient was not taking enough liquid POs. He developed abnormal skin rashes in left arm and left axilla for at least 3-4  few weeks initially treated with topical creams but progressing to raw, beefy appearance of the area.  He had been started on Keflex yesterday, which suspicious for cellulitis. Of note, he had had a skin biopsy of the area by a dermatologist last week which results are pending.  Denies fevers, chills, night sweats, vision changes, or mucositis. Denies any respiratory complaints. Denies any chest pain or palpitations. Denies lower extremity swelling. Denies nausea, heartburn or change in bowel habits. Denies abdominal pain. Appetite is decreased. Denies any dysuria.  Denies any bleeding issues such as epistaxis, hematemesis, hematuria or hematochezia. Denies any sick contacts    ED Course:  BP 164/96 mmHg  Pulse 81  Resp 18  SpO2 91% cT of the head is negative for acute findings, but an old thalamic lacunar infarct is noted.chest x-ray shows an infiltrate in the left lower lobe, for which Zithromax was added to the empiric coverage.  Initial lactic acid elevated at 3.12, now improving. He was initiated on Rocephin and IV fluids. EKG with normal sinus rhythm and nonspecific ST-T changes, QTc 453. He is being admitted for the treatment of cellulitis, community-acquired pneumonia, and  Transient likely metabolic encephalopathy likely due to the above mentioned.    Review of Systems: As per HPI otherwise 10 point review of systems negative.   Past Medical History  Diagnosis Date  .  CAD (coronary artery disease)   . BPH (benign prostatic hyperplasia)   . Arthritis   . Asthma   . GERD (gastroesophageal reflux disease)   . Hypertension   . Anxiety   . History of ASCVD     MULTIVESSEL  . Anginal chest pain at rest [facility])     Chronic non, controlled on Lorazepam  . Hyperlipidemia   . DJD (degenerative joint disease)   . Hemochromatosis     Possible, elevated iron stores, hook-like osteophytes on hand films, normal LFTs  . Esophageal ulcer without bleeding     bid ppi indefinitely    Past Surgical History  Procedure Laterality Date  . Cholecystectomy    . Rotator cuff repair    . Knee surgery    . Coronary artery bypass graft    . Descending aortic aneurysm repair w/ stent      DE stent ostium into the right radial free graft at OM-, 02-2007  . Esophagogastroduodenoscopy N/A 11/09/2014    Procedure: ESOPHAGOGASTRODUODENOSCOPY (EGD);  Surgeon: [name], MD;  Location: [facility] ENDOSCOPY;  Service: Endoscopy;  Laterality: N/A;     reports that he has never smoked. He has quit using smokeless tobacco. He reports that he does not drink alcohol or use illicit drugs. Walks   [place]. Lives alone.    Allergies  Allergen Reactions  . Feldene [Piroxicam] Other (See Comments)    REACTION: Blisters  . Statins Other (See Comments)    Myalgias  . Doxycycline Other (See Comments)    REACTION:  unknown  .  Tequin Anxiety  . Valium Other (See Comments)    REACTION:  unknown    Family History  Problem Relation Age of Onset  . Arthritis-Osteo Sister       Prior to Admission medications   Medication Sig Start Date End Date Taking? Authorizing Provider  atenolol (TENORMIN) 50 MG tablet TAKE 1 TABLET BY MOUTH  EVERY DAY 07/14/15  Yes Jettie Booze, MD  betamethasone dipropionate (DIPROLENE) 0.05 % ointment  08/11/15  Yes Historical Provider, MD  cephALEXin (KEFLEX) 500 MG capsule Take 500 mg by mouth 3 (three) times daily. Started on 09-02-15 for 10 days   Yes Historical  Provider, MD  clopidogrel (PLAVIX) 75 MG tablet TAKE 1 TABLET BY MOUTH  EVERY DAY 07/14/15  Yes Jettie Booze, MD  dapsone 25 MG tablet Take 50 mg by mouth daily.    Yes Historical Provider, MD  diclofenac sodium (VOLTAREN) 1 % GEL Apply 2 g topically daily as needed (for pain in knees and feet).   Yes Historical Provider, MD  FLUoxetine (PROZAC) 20 MG capsule TAKE ONE CAPSULE BY MOUTH DAILY 06/15/15  Yes [name], MD  folic acid (FOLVITE) 1 MG tablet Take 1 mg by mouth daily.   Yes Historical Provider, MD  hydrocortisone 2.5 % cream APPLY TOPICALLY TWO TIMES DAILY 07/14/15  Yes [name], MD  hydrOXYzine (ATARAX/VISTARIL) 25 MG tablet Take 1 tablet (25 mg total) by mouth every 8 (eight) hours as needed for itching. 08/04/15  Yes [name], MD  leflunomide (ARAVA) 10 MG tablet Take 10 mg by mouth daily.  08/01/15  Yes Historical Provider, MD  Lifitegrast Shirley Friar) 5 % SOLN Apply 1 drop to eye 2 (two) times daily. 08/20/15  Yes [name], MD  LORazepam (ATIVAN) 1 MG tablet TAKE ONE (1) TABLET BY MOUTH 3 TIMES DAILY. TAKE AT [facility] [place], [place]. 08/18/15  Yes [name], MD  mometasone (ELOCON) 0.1 % ointment APPLY TO THE AFFECTED AREAS DAILY AS DIRECTED 07/14/15  Yes [name], MD  pantoprazole (PROTONIX) 40 MG tablet TAKE 1 TABLET BY MOUTH EVERY DAY Patient taking differently: Take 40 mg by mouth 2 (two) times daily. TAKE 1 TABLET BY MOUTH EVERY DAY 07/14/15  Yes Jettie Booze, MD  predniSONE (DELTASONE) 10 MG tablet  08/11/15  Yes Historical Provider, MD  rosuvastatin (CRESTOR) 5 MG tablet TAKE 1 TABLET BY MOUTH  EVERY DAY 07/14/15  Yes Jettie Booze, MD  sodium chloride (OCEAN) 0.65 % SOLN nasal spray Place 1 spray into both nostrils as needed for congestion. 03/06/15  Yes Alycia Rossetti, MD  tamsulosin (FLOMAX) 0.4 MG CAPS capsule TAKE 1 CAPSULE EVERY DAY 06/15/15  Yes [name], MD  temazepam (RESTORIL) 30 MG capsule Take 30 mg by mouth at  bedtime as needed for sleep.   Yes Historical Provider, MD  traZODone (DESYREL) 50 MG tablet TAKE TWO TABLETS BY MOUTH AT BEDTIME AS NEEDED FOR SLEEP 06/26/15  Yes [name], MD  nitroGLYCERIN (NITROSTAT) 0.4 MG SL tablet Place 1 tablet (0.4 mg total) under the tongue every 5 (five) minutes as needed for chest pain. 07/14/15   Jettie Booze, MD    Physical Exam:    Filed Vitals:   09/04/15 1245 09/04/15 1300 09/04/15 1315 09/04/15 1330  BP: 167/86 161/87 182/82 164/96  Pulse: 83 81 85 81  Resp: '26 25 24 18  '$ SpO2: 94% 93% 93% 91%      Constitutional:  NAD, calm, comfortable Filed Vitals:   09/04/15 1245 09/04/15 1300 09/04/15 1315 09/04/15 1330  BP: 167/86 161/87 182/82 164/96  Pulse: 83 81 85 81  Resp: '26 25 24 18  '$ SpO2: 94% 93% 93% 91%   Eyes: PERRL, lids and conjunctivae normal  ENMT: Mucous membranes are moist. Posterior pharynx clear of any exudate or lesions.Normal dentition.  Neck: normal, supple, no masses, no thyromegaly Respiratory: clear to auscultation bilaterally, no wheezing, no crackles. Normal respiratory effort. No accessory muscle use.  Cardiovascular: Regular rate and rhythm, no murmurs / rubs / gallops. No extremity edema. 2+ pedal pulses. No carotid bruits.  Abdomen: no tenderness, no masses palpated. No hepatosplenomegaly. Bowel sounds positive.  Musculoskeletal: no clubbing / cyanosis. No joint deformity upper and lower extremities. Good ROM, no contractures. Normal muscle tone.  Skin: raw, beefy appearance of the skin of the left axilla, upper trunk and left arm with some eczematous appearing areas, painful to touch, according to paitient very itchy.  Neurologic: CN 2-12 grossly intact. Sensation intact, DTR normal. Strength 5/5 in all 4.  Psychiatric: Normal judgment and insight. Alert and oriented x 3. Normal mood.     Labs on Admission: I have personally reviewed following labs and imaging studies  CBC:  Recent Labs Lab 09/04/15 0845    WBC 10.9*  NEUTROABS 7.9*  HGB 14.5  HCT 45.7  MCV 95.8  PLT 143*    Basic Metabolic Panel:  Recent Labs Lab 09/04/15 0845  NA 137  K 4.6  CL 105  CO2 24  GLUCOSE 143*  BUN 19  CREATININE 1.42*  CALCIUM 8.7*    GFR: CrCl cannot be calculated (Unknown ideal weight.).  Liver Function Tests:  Recent Labs Lab 09/04/15 0845  AST 23  ALT 14*  ALKPHOS 69  BILITOT 1.6*  PROT 6.1*  ALBUMIN 3.3*   Urine analysis:    Component Value Date/Time   COLORURINE YELLOW 07/13/2014 1804   APPEARANCEUR CLEAR 07/13/2014 1804   LABSPEC 1.011 07/13/2014 1804   PHURINE 5.5 07/13/2014 1804   GLUCOSEU NEGATIVE 07/13/2014 1804   HGBUR NEGATIVE 07/13/2014 1804   BILIRUBINUR NEGATIVE 07/13/2014 1804   KETONESUR NEGATIVE 07/13/2014 1804   PROTEINUR NEGATIVE 07/13/2014 1804   UROBILINOGEN 0.2 07/13/2014 1804   NITRITE NEGATIVE 07/13/2014 1804   LEUKOCYTESUR NEGATIVE 07/13/2014 1804    Sepsis Labs: '@LABRCNTIP'$ (procalcitonin:4,lacticidven:4) ) Recent Results (from the past 240 hour(s))  Blood culture (routine x 2)     Status: None (Preliminary result)   Collection Time: 09/04/15  8:55 AM  Result Value Ref Range Status   Specimen Description BLOOD RIGHT ARM  Final   Special Requests BOTTLES DRAWN AEROBIC AND ANAEROBIC  5CC  Final   Culture PENDING  Incomplete   Report Status PENDING  Incomplete     Radiological Exams on Admission: Ct Head Wo Contrast  09/04/2015  CLINICAL DATA:  Worsening weakness beginning 3 days ago. Left-sided rash. EXAM: CT HEAD WITHOUT CONTRAST TECHNIQUE: Contiguous axial images were obtained from the base of the skull through the vertex without intravenous contrast. COMPARISON:  01/26/2014 FINDINGS: The brain shows mild age related atrophy. There chronic changes of small vessel disease within the deep white matter. There is an old thalamic infarction on the left. No sign of acute infarction, mass lesion, hemorrhage, hydrocephalus or extra-axial collection.  Previous mastoidectomy on the right. Chronic mucoperiosteal inflammatory changes of the left maxillary sinus. There is atherosclerotic calcification of the major vessels at the base of the brain. IMPRESSION: No acute  finding by CT. 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 Electronically Signed   By: Nelson Chimes M.D.   On: 09/04/2015 09:55   Dg Chest Portable 1 View  09/04/2015  CLINICAL DATA:  Altered mental status EXAM: PORTABLE CHEST 1 VIEW COMPARISON:  01/26/2014 FINDINGS: Patient is status post median sternotomy. Mild to moderate cardiac enlargement. Vascular pattern normal. Lungs clear. No pleural effusion. IMPRESSION: Cardiac enlargement similar to prior study. No acute findings otherwise. Electronically Signed   By: Skipper Cliche M.D.   On: 09/04/2015 11:25    EKG: Independently reviewed.  Assessment/Plan Active Problems:   * No active hospital problems. *    Cellulitis with abnormal skin rashes and erythema  in left arm and left axilla for a few weeks for which he had been started on Keflex yesterday, but is suspicious for cellulitis. WBC 10.9  Afebrile, O sats normal -Admit to med surg Initial lactic acid elevated but trending down (if elevated increase IVF). Afebrile and nontoxic appearing broad spectrum antibiotics with vancomycin and Zosyn Blood cultures; check urine culture Check serum lactate and Procalcitonin IVF Hold Arava for now Continue Atarax for itching Follow skin biopsy results from procedure done last Monday   Wann likely due to  Cellulitis/ metabolic. Osats normal  CT head is negative for acute intracranial abnormalities. Afebrile. WBC 10.9.  VSS. Lactic acid was 3.2 but with IVF now at 0.8 . t bil 1.6  Admit to med-surg  Urine Drug Screen BCx  IVF Ammonia  Follow Bili  Community acquired pneumonia Chest x-ray shows LLL infiltrate. Afebrile WBC 10.9   Vanco/Zosyn,  IV fluids   Case was  discussed with the patient who agrees with the current plan of care.   AKI likely due to dehydration, antibiotics,  baseline creatinine  Less than 1 -  Minimize nephrotoxins and renally dose medications Lab Results  Component Value Date   CREATININE 1.42* 09/04/2015   CREATININE 1.00 08/20/2015   CREATININE 0.87 04/20/2015    Hypertension CAD s/p CaBG BP 163/91 mmHg  Pulse 85  Resp 27  SpO2 92% Continue home anti-hypertensive medications.   GERD Continue Protonix  Anxiety/Depression Continue Prozac and Desyrel  Rheumathoid Arthritis Holds Arava   Deconditioning Consult PT/OT/Nutrition  DVT prophylaxis:  Code Status:   Full     Family Communication:  Discussed with children Disposition Plan: Expect patient to be discharged to home after condition improves Consults called:    None Admission status: Inpatient   Medical floor    [name] E, PA-C [facility]   If 7PM-7AM, please contact night-coverage [email or web address] Password [email or web address]  09/04/2015, 1:53 PM

## 2015-09-04 NOTE — Progress Notes (Addendum)
Evan [name] [id number] Admission Data: 09/04/2015 5:02 PM Attending Provider: Mir Mohammed Brewster,*  FEX:[id number],HVFMBB TOM, MD Consults/ Treatment Team:    [name] is a 82 y.o. male patient admitted from ED awake, alert  & orientated  X 3,  Full Code, VSS - Blood pressure 163/91, pulse 85, resp. rate 27, SpO2 92 %., O2    1 L nasal cannular, no c/o shortness of breath, no c/o chest pain, no distress noted.    IV site WDL: Right arm, may refer to [facility].  Allergies:   Allergies  Allergen Reactions  . Feldene [Piroxicam] Other (See Comments)    REACTION: Blisters  . Statins Other (See Comments)    Myalgias  . Doxycycline Other (See Comments)    REACTION:  unknown  . Tequin Anxiety  . Valium Other (See Comments)    REACTION:  unknown     Past Medical History  Diagnosis Date  . CAD (coronary artery disease)   . BPH (benign prostatic hyperplasia)   . Arthritis   . Asthma   . GERD (gastroesophageal reflux disease)   . Hypertension   . Anxiety   . History of ASCVD     MULTIVESSEL  . Anginal chest pain at rest [facility])     Chronic non, controlled on Lorazepam  . Hyperlipidemia   . DJD (degenerative joint disease)   . Hemochromatosis     Possible, elevated iron stores, hook-like osteophytes on hand films, normal LFTs  . Esophageal ulcer without bleeding     bid ppi indefinitely     Pt orientation to unit, room and routine. Information packet given to patient/family and safety video watched.  Admission INP armband ID verified with patient/family, and in place. SR up x 2, fall risk assessment complete with Patient and family verbalizing understanding of risks associated with falls. Pt verbalizes an understanding of how to use the call bell and to call for help before getting out of bed.  Skin, clean-dry- Right leg abrasion area noted, cellulitis to Left arm noted. Generalized bruising noted from S/P fall at home. Two small drops of blood noted to underwear, no active bleed  noted, unable to tell specifically where it came from. Scrotum sac slightly excoriated.      Will cont to monitor and assist as needed.  Dayle Points, RN 09/04/2015 5:02 PM

## 2015-09-04 NOTE — ED Provider Notes (Signed)
CSN: [id number]     Arrival date & time 09/04/15  6073 History   First MD Initiated Contact with Patient 09/04/15 [id number]     Chief Complaint  Patient presents with  . Weakness  . Wound Check     (Consider location/radiation/quality/duration/timing/severity/associated sxs/prior Treatment) HPI 82 year old male presents today with reports that he has had increasing weakness for 3 days. He was alone and cares for himself at baseline. His daughter is present. She states that 3 days ago he began having difficulty walking. He fell yesterday. He and she denied there is any injury. He is having problems with the rash and was seen 2 days ago by dermatology. Keflex was started yesterday. He is unable to identify any focal deficits. He denies any headache or head injury. The rash is in the left arm and left axilla. It has been worsening over several weeks. Past Medical History  Diagnosis Date  . CAD (coronary artery disease)   . BPH (benign prostatic hyperplasia)   . Arthritis   . Asthma   . GERD (gastroesophageal reflux disease)   . Hypertension   . Anxiety   . History of ASCVD     MULTIVESSEL  . Anginal chest pain at rest [facility])     Chronic non, controlled on Lorazepam  . Hyperlipidemia   . DJD (degenerative joint disease)   . Hemochromatosis     Possible, elevated iron stores, hook-like osteophytes on hand films, normal LFTs  . Esophageal ulcer without bleeding     bid ppi indefinitely   Past Surgical History  Procedure Laterality Date  . Cholecystectomy    . Rotator cuff repair    . Knee surgery    . Coronary artery bypass graft    . Descending aortic aneurysm repair w/ stent      DE stent ostium into the right radial free graft at OM-, 02-2007  . Esophagogastroduodenoscopy N/A 11/09/2014    Procedure: ESOPHAGOGASTRODUODENOSCOPY (EGD);  Surgeon: [name], MD;  Location: [facility] ENDOSCOPY;  Service: Endoscopy;  Laterality: N/A;   Family History  Problem Relation Age of Onset  .  Arthritis-Osteo Sister    Social History  Substance Use Topics  . Smoking status: Never Smoker   . Smokeless tobacco: Former Systems developer  . Alcohol Use: No    Review of Systems  All other systems reviewed and are negative.     Allergies  Feldene; Statins; Doxycycline; Tequin; and Valium  Home Medications   Prior to Admission medications   Medication Sig Start Date End Date Taking? Authorizing Provider  atenolol (TENORMIN) 50 MG tablet TAKE 1 TABLET BY MOUTH  EVERY DAY 07/14/15   Jettie Booze, MD  betamethasone dipropionate (DIPROLENE) 0.05 % ointment  08/11/15   Historical Provider, MD  clopidogrel (PLAVIX) 75 MG tablet TAKE 1 TABLET BY MOUTH  EVERY DAY 07/14/15   Jettie Booze, MD  diclofenac sodium (VOLTAREN) 1 % GEL Apply 2 g topically daily as needed (for pain in knees and feet).    Historical Provider, MD  FLUoxetine (PROZAC) 20 MG capsule TAKE ONE CAPSULE BY MOUTH DAILY 06/15/15   [name], MD  folic acid (FOLVITE) 1 MG tablet Take 1 mg by mouth daily.    Historical Provider, MD  hydrocortisone 2.5 % cream APPLY TOPICALLY TWO TIMES DAILY 07/14/15   [name], MD  hydrOXYzine (ATARAX/VISTARIL) 25 MG tablet Take 1 tablet (25 mg total) by mouth every 8 (eight) hours as needed for itching. 08/04/15   Cletus Gash  Avel Peace, MD  leflunomide (ARAVA) 10 MG tablet Take 10 mg by mouth daily.  08/01/15   Historical Provider, MD  Lifitegrast Shirley Friar) 5 % SOLN Apply 1 drop to eye 2 (two) times daily. 08/20/15   [name], MD  LORazepam (ATIVAN) 1 MG tablet TAKE ONE (1) TABLET BY MOUTH 3 TIMES DAILY. TAKE AT [facility] [place], [place]. 08/18/15   [name], MD  mometasone (ELOCON) 0.1 % ointment APPLY TO THE AFFECTED AREAS DAILY AS DIRECTED 07/14/15   [name], MD  nitroGLYCERIN (NITROSTAT) 0.4 MG SL tablet Place 1 tablet (0.4 mg total) under the tongue every 5 (five) minutes as needed for chest pain. 07/14/15   Jettie Booze, MD  pantoprazole (PROTONIX) 40 MG  tablet TAKE 1 TABLET BY MOUTH EVERY DAY 07/14/15   Jettie Booze, MD  predniSONE (DELTASONE) 10 MG tablet  08/11/15   Historical Provider, MD  rosuvastatin (CRESTOR) 5 MG tablet TAKE 1 TABLET BY MOUTH  EVERY DAY 07/14/15   Jettie Booze, MD  sodium chloride (OCEAN) 0.65 % SOLN nasal spray Place 1 spray into both nostrils as needed for congestion. 03/06/15   Alycia Rossetti, MD  tamsulosin (FLOMAX) 0.4 MG CAPS capsule TAKE 1 CAPSULE EVERY DAY 06/15/15   [name], MD  temazepam (RESTORIL) 30 MG capsule Take 30 mg by mouth at bedtime as needed for sleep.    Historical Provider, MD  traZODone (DESYREL) 50 MG tablet TAKE TWO TABLETS BY MOUTH AT BEDTIME AS NEEDED FOR SLEEP 06/26/15   [name], MD   BP 145/79 mmHg  Pulse 104  SpO2 95% Physical Exam  Constitutional: He appears well-developed and well-nourished. No distress.  HENT:  Head: Normocephalic and atraumatic.  Right Ear: External ear normal.  Left Ear: External ear normal.  Nose: Nose normal.  Mouth/Throat: Oropharynx is clear and moist.  Eyes: Conjunctivae and EOM are normal. Pupils are equal, round, and reactive to light.  Neck: Normal range of motion. Neck supple.  Cardiovascular: Normal rate, regular rhythm, normal heart sounds and intact distal pulses.   Pulmonary/Chest: Effort normal and breath sounds normal.  Abdominal: Soft. Bowel sounds are normal.  Musculoskeletal: Normal range of motion.  Neurological: He is alert. He has normal reflexes.  Patient oriented to place, but not date  Skin: Skin is warm and dry. Rash noted. Rash is macular.     Well circumcribed area of erythema left ue and left axilla  Psychiatric: He has a normal mood and affect. His behavior is normal. Judgment and thought content normal.  Nursing note and vitals reviewed.   ED Course  Procedures (including critical care time) Labs Review Labs Reviewed  CULTURE, BLOOD (ROUTINE X 2)  CULTURE, BLOOD (ROUTINE X 2)  CBC WITH  DIFFERENTIAL/PLATELET  COMPREHENSIVE METABOLIC PANEL  I-STAT CG4 LACTIC ACID, ED    Imaging Review Ct Head Wo Contrast  09/04/2015  CLINICAL DATA:  Worsening weakness beginning 3 days ago. Left-sided rash. EXAM: CT HEAD WITHOUT CONTRAST TECHNIQUE: Contiguous axial images were obtained from the base of the skull through the vertex without intravenous contrast. COMPARISON:  01/26/2014 FINDINGS: The brain shows mild age related atrophy. There chronic changes of small vessel disease within the deep white matter. There is an old thalamic infarction on the left. No sign of acute infarction, mass lesion, hemorrhage, hydrocephalus or extra-axial collection. Previous mastoidectomy on the right. Chronic mucoperiosteal inflammatory changes of the left maxillary sinus. There is atherosclerotic calcification of the  major vessels at the base of the brain. IMPRESSION: No acute finding by CT. 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 Electronically Signed   By: Nelson Chimes M.D.   On: 09/04/2015 09:55   I have personally reviewed and evaluated these images and lab results as part of my medical decision-making.   EKG Interpretation   Date/Time:  Friday September 04 2015 09:48:15 EDT Ventricular Rate:  93 PR Interval:  211 QRS Duration: 105 QT Interval:  364 QTC Calculation: 453 R Axis:   -14 Text Interpretation:  Normal sinus rhythm Left axis deviation Non-specific  ST-t changes Confirmed by Raeana Blinn MD, Andee Poles ([id number]) on 09/04/2015 10:35:22 AM      MDM   Final diagnoses:  Cellulitis of left axilla  Metabolic encephalopathy    82 year old male history of coronary artery disease and hypertension who lives independently presents today with increased confusion and cellulitis. Cellulitis noted left upper extremity to axilla. He is somewhat confused but has no focal deficits. CT of the head shows no acute findings but an old thalamic lacunar infarct. Initial lactic  acid is elevated at 3.12.  Patient is treated with Rocephin and IV fluids.  CXR with lll infiltrate and zithromax added to empiric coverage for cap.  Repeat lactic pending 1:46 PM Repeat lactic clearing.  Patient continues mildly confused likely secondary to infection.  Discussed admission with patient and daughter.   1- cellulitis 2- cap 3- metabolic encephalopathy secondary to 1 and 2.   Discussed with Ms. Wertman and plan admission to hospitalist service.   Pattricia Boss, MD 09/04/15 [phone]

## 2015-09-05 ENCOUNTER — Inpatient Hospital Stay (HOSPITAL_COMMUNITY): Payer: Commercial Managed Care - HMO

## 2015-09-05 LAB — BLOOD CULTURE ID PANEL (REFLEXED)
Acinetobacter baumannii: NOT DETECTED
Candida albicans: NOT DETECTED
Candida glabrata: NOT DETECTED
Candida krusei: NOT DETECTED
Candida parapsilosis: NOT DETECTED
Candida tropicalis: NOT DETECTED
Carbapenem resistance: NOT DETECTED
Enterobacter cloacae complex: NOT DETECTED
Enterobacteriaceae species: NOT DETECTED
Enterococcus species: NOT DETECTED
Escherichia coli: NOT DETECTED
Haemophilus influenzae: NOT DETECTED
Klebsiella oxytoca: NOT DETECTED
Klebsiella pneumoniae: NOT DETECTED
Listeria monocytogenes: NOT DETECTED
Methicillin resistance: DETECTED — AB
Neisseria meningitidis: NOT DETECTED
Proteus species: NOT DETECTED
Pseudomonas aeruginosa: NOT DETECTED
Serratia marcescens: NOT DETECTED
Staphylococcus aureus (BCID): NOT DETECTED
Staphylococcus species: DETECTED — AB
Streptococcus agalactiae: NOT DETECTED
Streptococcus pneumoniae: NOT DETECTED
Streptococcus pyogenes: NOT DETECTED
Streptococcus species: NOT DETECTED
Vancomycin resistance: NOT DETECTED

## 2015-09-05 LAB — COMPREHENSIVE METABOLIC PANEL
ALT: 12 U/L — ABNORMAL LOW (ref 17–63)
AST: 16 U/L (ref 15–41)
Albumin: 2.5 g/dL — ABNORMAL LOW (ref 3.5–5.0)
Alkaline Phosphatase: 53 U/L (ref 38–126)
Anion gap: 7 (ref 5–15)
BUN: 14 mg/dL (ref 6–20)
CO2: 23 mmol/L (ref 22–32)
Calcium: 8.3 mg/dL — ABNORMAL LOW (ref 8.9–10.3)
Chloride: 105 mmol/L (ref 101–111)
Creatinine, Ser: 1.12 mg/dL (ref 0.61–1.24)
GFR calc Af Amer: >60 mL/min (ref >60)
GFR calc non Af Amer: 57 mL/min — ABNORMAL LOW (ref >60)
Glucose, Bld: 96 mg/dL (ref 65–99)
Potassium: 4.6 mmol/L (ref 3.5–5.1)
Sodium: 135 mmol/L (ref 135–145)
Total Bilirubin: 1.7 mg/dL — ABNORMAL HIGH (ref 0.3–1.2)
Total Protein: 5.1 g/dL — ABNORMAL LOW (ref 6.5–8.1)

## 2015-09-05 LAB — TROPONIN I: Troponin I: 0.04 ng/mL — ABNORMAL HIGH (ref <0.031)

## 2015-09-05 LAB — CBC
HCT: 39.8 % (ref 39.0–52.0)
Hemoglobin: 12.7 g/dL — ABNORMAL LOW (ref 13.0–17.0)
MCH: 30.1 pg (ref 26.0–34.0)
MCHC: 31.9 g/dL (ref 30.0–36.0)
MCV: 94.3 fL (ref 78.0–100.0)
Platelets: 104 10*3/uL — ABNORMAL LOW (ref 150–400)
RBC: 4.22 MIL/uL (ref 4.22–5.81)
RDW: 12.9 % (ref 11.5–15.5)
WBC: 8 10*3/uL (ref 4.0–10.5)

## 2015-09-05 LAB — PROTIME-INR
INR: 1.17 (ref 0.00–1.49)
Prothrombin Time: 15.1 seconds (ref 11.6–15.2)

## 2015-09-05 MED ORDER — TAMSULOSIN HCL 0.4 MG PO CAPS
0.4000 mg | ORAL_CAPSULE | Freq: Two times a day (BID) | ORAL | Status: DC
  Administered 2015-09-05 – 2015-09-10 (×9): 0.4 mg via ORAL
  Filled 2015-09-05 (×9): qty 1

## 2015-09-05 MED ORDER — CLONIDINE HCL 0.1 MG PO TABS
0.1000 mg | ORAL_TABLET | Freq: Four times a day (QID) | ORAL | Status: DC | PRN
  Administered 2015-09-05 – 2015-09-08 (×2): 0.1 mg via ORAL
  Filled 2015-09-05 (×2): qty 1

## 2015-09-05 MED ORDER — TERBINAFINE HCL 1 % EX CREA
TOPICAL_CREAM | Freq: Two times a day (BID) | CUTANEOUS | Status: DC
  Administered 2015-09-05 – 2015-09-10 (×11): via TOPICAL
  Filled 2015-09-05 (×2): qty 12

## 2015-09-05 MED ORDER — SACCHAROMYCES BOULARDII 250 MG PO CAPS
250.0000 mg | ORAL_CAPSULE | Freq: Two times a day (BID) | ORAL | Status: DC
  Administered 2015-09-05 – 2015-09-10 (×11): 250 mg via ORAL
  Filled 2015-09-05 (×11): qty 1

## 2015-09-05 NOTE — Progress Notes (Signed)
PHARMACY - PHYSICIAN COMMUNICATION CRITICAL VALUE ALERT - BLOOD CULTURE IDENTIFICATION (BCID)  Results for orders placed or performed during the hospital encounter of 09/04/15  Blood Culture ID Panel (Reflexed) (Collected: 09/04/2015  8:55 AM)  Result Value Ref Range   Enterococcus species NOT DETECTED NOT DETECTED   Vancomycin resistance NOT DETECTED NOT DETECTED   Listeria monocytogenes NOT DETECTED NOT DETECTED   Staphylococcus species DETECTED (A) NOT DETECTED   Staphylococcus aureus NOT DETECTED NOT DETECTED   Methicillin resistance DETECTED (A) NOT DETECTED   Streptococcus species NOT DETECTED NOT DETECTED   Streptococcus agalactiae NOT DETECTED NOT DETECTED   Streptococcus pneumoniae NOT DETECTED NOT DETECTED   Streptococcus pyogenes NOT DETECTED NOT DETECTED   Acinetobacter baumannii NOT DETECTED NOT DETECTED   Enterobacteriaceae species NOT DETECTED NOT DETECTED   Enterobacter cloacae complex NOT DETECTED NOT DETECTED   Escherichia coli NOT DETECTED NOT DETECTED   Klebsiella oxytoca NOT DETECTED NOT DETECTED   Klebsiella pneumoniae NOT DETECTED NOT DETECTED   Proteus species NOT DETECTED NOT DETECTED   Serratia marcescens NOT DETECTED NOT DETECTED   Carbapenem resistance NOT DETECTED NOT DETECTED   Haemophilus influenzae NOT DETECTED NOT DETECTED   Neisseria meningitidis NOT DETECTED NOT DETECTED   Pseudomonas aeruginosa NOT DETECTED NOT DETECTED   Candida albicans NOT DETECTED NOT DETECTED   Candida glabrata NOT DETECTED NOT DETECTED   Candida krusei NOT DETECTED NOT DETECTED   Candida parapsilosis NOT DETECTED NOT DETECTED   Candida tropicalis NOT DETECTED NOT DETECTED    Name of physician (or Provider) Contacted: Dr. Maudie Mercury  Changes to prescribed antibiotics required: continue vancomycin, discontinue zosyn.   Hildred Laser, Pharm D 09/05/2015 5:13 PM

## 2015-09-05 NOTE — Progress Notes (Signed)
PROGRESS NOTE                                                                                                                                                                                                             Patient Demographics:    Evan [name], is a 82 y.o. male, DOB - 02-13-1928, LAG:[id number]  Admit date - 09/04/2015   Admitting Physician Mir Marry Guan, MD  Outpatient Primary MD for the patient is [facility] TOM, MD  LOS - 1  Outpatient Specialists:   Chief Complaint  Patient presents with  . Weakness  . Wound Check       Brief Narrative  Evan [name] is a 82 y.o. male presenting with 3 day history of generalized weakness, increasing difficulty walking resulting in a fall yesterday, but without LOC or injury. Mild is reported by his daughter,as patient was not taking enough liquid POs. He developed abnormal skin rashes in left arm and left axilla for at least 3-4 few weeks initially treated with topical creams but progressing to raw, beefy appearance of the area. He had been started on Keflex yesterday, which suspicious for cellulitis. Of note, he had had a skin biopsy of the area by a dermatologist last week which results are pending. Denies fevers, chills, night sweats, vision changes, or mucositis. Denies any respiratory complaints. Denies any chest pain or palpitations. Denies lower extremity swelling. Denies nausea, heartburn or change in bowel habits. Denies abdominal pain. Appetite is decreased. Denies any dysuria. Denies any bleeding issues such as epistaxis, hematemesis, hematuria or hematochezia. Denies any sick contacts    Subjective:    Evan [name] today has,rash on his left forearm, and axillary area. Pt states that the rash has been going on for a couple of months.   Worse over the past 3 days.  Biopsy 3 days ago by dermatology,  Results unknown, pt denies any new contact, or  medications. +itching.     Assessment  & Plan :    Active Problems:   Cellulitis   1. Cellulitis Check esr Cont vanco, zosyn iv  2. ? Tinea  lamisil cream topically bid  3. Bph symptoms Increase  flomax 0.54m po bid  4. Hypertension uncontrolled Clonidine prn Increase flomax Cont atenolol  5. CAD Cont plavix, atenolol    Code Status : Full  Family Communication  : w/ son  Disposition Plan  : home  Barriers For Discharge : none  Consults  :  none  Procedures  : none  DVT Prophylaxis  : Heparin  Lab Results  Component Value Date   PLT 104* 09/05/2015    Antibiotics  :  Vanco, zosyn  Anti-infectives    Start     Dose/Rate Route Frequency Ordered Stop   09/05/15 1700  vancomycin (VANCOCIN) 1,250 mg in sodium chloride 0.9 % 250 mL IVPB     1,250 mg 166.7 mL/hr over 90 Minutes Intravenous Every 24 hours 09/04/15 1607     09/04/15 2300  piperacillin-tazobactam (ZOSYN) IVPB 3.375 g     3.375 g 12.5 mL/hr over 240 Minutes Intravenous Every 8 hours 09/04/15 1607     09/04/15 1600  vancomycin (VANCOCIN) 1,500 mg in sodium chloride 0.9 % 500 mL IVPB     1,500 mg 250 mL/hr over 120 Minutes Intravenous  Once 09/04/15 1527 09/04/15 2315   09/04/15 1600  piperacillin-tazobactam (ZOSYN) IVPB 3.375 g     3.375 g 100 mL/hr over 30 Minutes Intravenous  Once 09/04/15 1527 09/04/15 1722   09/04/15 1115  azithromycin (ZITHROMAX) 500 mg in dextrose 5 % 250 mL IVPB     500 mg 250 mL/hr over 60 Minutes Intravenous  Once 09/04/15 1110 09/04/15 1224   09/04/15 0900  cefTRIAXone (ROCEPHIN) 1 g in dextrose 5 % 50 mL IVPB     1 g 100 mL/hr over 30 Minutes Intravenous  Once 09/04/15 0850 09/04/15 1008        Objective:   Filed Vitals:   09/04/15 1650 09/04/15 1840 09/04/15 2155 09/05/15 0508  BP: 125/61  155/57 163/67  Pulse: 60  69 62  Temp: 98.6 F (37 C)  97.7 F (36.5 C) 97.7 F (36.5 C)  TempSrc: Oral  Oral Oral  Resp: 22  18 18   Height:  5' 3"  (1.6 m)      Weight:  81.647 kg (180 lb)    SpO2: 95%  95% 96%    Wt Readings from Last 3 Encounters:  09/04/15 81.647 kg (180 lb)  08/20/15 81.647 kg (180 lb)  08/04/15 81.647 kg (180 lb)     Intake/Output Summary (Last 24 hours) at 09/05/15 0931 Last data filed at 09/05/15 0714  Gross per 24 hour  Intake   3140 ml  Output    750 ml  Net   2390 ml     Physical Exam  Awake Alert, Oriented X 3, No new F.N deficits, Normal affect [place].AT,PERRAL Supple Neck,No JVD, No cervical lymphadenopathy appriciated.  Symmetrical Chest wall movement, Good air movement bilaterally, CTAB RRR,No Gallops,Rubs or new Murmurs, No Parasternal Heave +ve B.Sounds, Abd Soft, No tenderness, No organomegaly appriciated, No rebound - guarding or rigidity. No Cyanosis, Clubbing or edema, + redness over the left axilla, and left forearm,   Scaly over the left axilla, 1 suture in place on the left forearm   Data Review:    CBC  Recent Labs Lab 09/04/15 0845 09/05/15 0504  WBC 10.9* 8.0  HGB 14.5 12.7*  HCT 45.7 39.8  PLT 143* 104*  MCV 95.8 94.3  MCH 30.4 30.1  MCHC 31.7 31.9  RDW 13.1 12.9  LYMPHSABS 1.7  --   MONOABS 1.1*  --  EOSABS 0.1  --   BASOSABS 0.0  --     Chemistries   Recent Labs Lab 09/04/15 0845 09/05/15 0504  NA 137 135  K 4.6 4.6  CL 105 105  CO2 24 23  GLUCOSE 143* 96  BUN 19 14  CREATININE 1.42* 1.12  CALCIUM 8.7* 8.3*  AST 23 16  ALT 14* 12*  ALKPHOS 69 53  BILITOT 1.6* 1.7*   ------------------------------------------------------------------------------------------------------------------ No results for input(s): CHOL, HDL, LDLCALC, TRIG, CHOLHDL, LDLDIRECT in the last 72 hours.  No results found for: HGBA1C ------------------------------------------------------------------------------------------------------------------ No results for input(s): TSH, T4TOTAL, T3FREE, THYROIDAB in the last 72 hours.  Invalid input(s):  FREET3 ------------------------------------------------------------------------------------------------------------------ No results for input(s): VITAMINB12, FOLATE, FERRITIN, TIBC, IRON, RETICCTPCT in the last 72 hours.  Coagulation profile  Recent Labs Lab 09/05/15 0504  INR 1.17    No results for input(s): DDIMER in the last 72 hours.  Cardiac Enzymes No results for input(s): CKMB, TROPONINI, MYOGLOBIN in the last 168 hours.  Invalid input(s): CK ------------------------------------------------------------------------------------------------------------------ No results found for: BNP  Inpatient Medications  Scheduled Meds: . atenolol  50 mg Oral Daily  . clopidogrel  75 mg Oral Daily  . FLUoxetine  20 mg Oral Daily  . heparin  5,000 Units Subcutaneous Q8H  . Lifitegrast  1 drop Ophthalmic BID  . pantoprazole  40 mg Oral BID  . piperacillin-tazobactam (ZOSYN)  IV  3.375 g Intravenous Q8H  . saccharomyces boulardii  250 mg Oral BID  . tamsulosin  0.4 mg Oral BID AC  . terbinafine   Topical BID  . vancomycin  1,250 mg Intravenous Q24H   Continuous Infusions: . sodium chloride 75 mL/hr at 09/04/15 1651   PRN Meds:.acetaminophen **OR** acetaminophen, bisacodyl, HYDROcodone-acetaminophen, hydrOXYzine, magnesium citrate, morphine injection, ondansetron **OR** ondansetron (ZOFRAN) IV, senna-docusate, traZODone  Micro Results Recent Results (from the past 240 hour(s))  Blood culture (routine x 2)     Status: None (Preliminary result)   Collection Time: 09/04/15  8:55 AM  Result Value Ref Range Status   Specimen Description BLOOD RIGHT ARM  Final   Special Requests BOTTLES DRAWN AEROBIC AND ANAEROBIC  5CC  Final   Culture PENDING  Incomplete   Report Status PENDING  Incomplete    Radiology Reports Ct Head Wo Contrast  09/04/2015  CLINICAL DATA:  Worsening weakness beginning 3 days ago. Left-sided rash. EXAM: CT HEAD WITHOUT CONTRAST TECHNIQUE: Contiguous axial images  were obtained from the base of the skull through the vertex without intravenous contrast. COMPARISON:  01/26/2014 FINDINGS: The brain shows mild age related atrophy. There chronic changes of small vessel disease within the deep white matter. There is an old thalamic infarction on the left. No sign of acute infarction, mass lesion, hemorrhage, hydrocephalus or extra-axial collection. Previous mastoidectomy on the right. Chronic mucoperiosteal inflammatory changes of the left maxillary sinus. There is atherosclerotic calcification of the major vessels at the base of the brain. IMPRESSION: No acute finding by CT. 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 Electronically Signed   By: Nelson Chimes M.D.   On: 09/04/2015 09:55   Dg Chest Portable 1 View  09/04/2015  CLINICAL DATA:  Altered mental status EXAM: PORTABLE CHEST 1 VIEW COMPARISON:  01/26/2014 FINDINGS: Patient is status post median sternotomy. Mild to moderate cardiac enlargement. Vascular pattern normal. Lungs clear. No pleural effusion. IMPRESSION: Cardiac enlargement similar to prior study. No acute findings otherwise. Electronically Signed   By: Elodia Florence.D.  On: 09/04/2015 11:25    Time Spent in minutes  30   Jani Gravel M.D on 09/05/2015 at 9:31 AM  Between 7am to 7pm - Pager - [phone]  After 7pm go to [email or web address] - password [facility]  [facility] -  Office  [phone]

## 2015-09-05 NOTE — Evaluation (Signed)
Physical Therapy Evaluation Patient Details Name: Evan [name] MRN: [id number] DOB: 1927-05-26 Today's Date: 09/05/2015   History of Present Illness  HPI: Evan [name] is a 82 y.o. male presenting with 3 day history of generalized weakness, increasing difficulty walking resulting in a fall yesterday, but without LOC or injury; He developed abnormal skin rashes in left arm and left axilla for at least 3-4 few weeks initially treated with topical creams but progressing to raw, beefy appearance of the area;  in the ED, chest x-ray shows an infiltrate in the left lower lobe;  has a past medical history of CAD (coronary artery disease); ; Hyperlipidemia; Hemochromatosis; Myocardial infarction (Evan [name]) (1999); Pneumonia; Hepatitis; Headache; Arthritis; DJD (degenerative joint disease); Rheumatoid arthritis ([place]); Osteoarthritis; Chronic lower back pain; and Depression.  Clinical Impression  Patient presents with mild dependencies in gait and mobility due to generalized weakness and decreased balance.  Feel patient will progress steadily and should not need any assistive device or f/u PT at discharge.  Patient will benefit from continued PT while in hospital to progress mobility and independence back to baseline.      Follow Up Recommendations Home health PT    Equipment Recommendations  None recommended by PT    Recommendations for Other Services       Precautions / Restrictions Precautions Precautions: Fall Restrictions Weight Bearing Restrictions: No      Mobility  Bed Mobility Overal bed mobility: Modified Independent             General bed mobility comments: used railing to get to EOB  Transfers Overall transfer level: Needs assistance Equipment used: None Transfers: Sit to/from Stand Sit to Stand: Min guard            Ambulation/Gait Ambulation/Gait assistance: Min assist Ambulation Distance (Feet): 80 Feet Assistive device: 1 person hand held assist Gait  Pattern/deviations: Step-through pattern;Shuffle     General Gait Details: intermitten hand-held assist, slightly unsteady during gait  Stairs            Wheelchair Mobility    Modified Rankin (Stroke Patients Only)       Balance Overall balance assessment: Needs assistance Sitting-balance support: No upper extremity supported Sitting balance-[name] Scale: Good     Standing balance support: No upper extremity supported Standing balance-[name] Scale: Fair                               Pertinent Vitals/Pain Pain Assessment: No/denies pain    Home Living Family/patient expects to be discharged to:: Private residence Living Arrangements: Alone Available Help at Discharge: Family;Available PRN/intermittently Type of Home: House Home Access: Stairs to enter   Entrance Stairs-Number of Steps: 3 Home Layout: Two level;Laundry or work area in basement;Able to live on main level with [employer]: None      Prior Function Level of Independence: Independent               Journalist, newspaper        Extremity/Trunk Assessment   Upper Extremity Assessment: Overall WFL for tasks assessed           Lower Extremity Assessment: Overall WFL for tasks assessed         Communication   Communication: No difficulties  Cognition Arousal/Alertness: Awake/alert Behavior During Therapy: Anxious Overall Cognitive Status: Within Functional Limits for tasks assessed  General Comments General comments (skin integrity, edema, etc.): patient c/o chills during session.  Temp = 99    Exercises        Assessment/Plan    PT Assessment Patient needs continued PT services  PT Diagnosis Difficulty walking;Generalized weakness   PT Problem List Decreased activity tolerance;Decreased balance;Decreased mobility  PT Treatment Interventions DME instruction;Gait training;Functional mobility training;Therapeutic  activities;Therapeutic exercise;Balance training   PT Goals (Current goals can be found in the Care Plan section) Acute Rehab PT Goals Patient Stated Goal: go home when I feel better PT Goal Formulation: With patient/family Time For Goal Achievement: 09/12/15 Potential to Achieve Goals: Good    Frequency Min 3X/week   Barriers to discharge Decreased caregiver support      Co-evaluation               End of Session   Activity Tolerance: Patient tolerated treatment well;Patient limited by fatigue Patient left: in bed;with family/visitor present;with call bell/phone within reach           Time: 0867-6195 PT Time Calculation (min) (ACUTE ONLY): 19 min   Charges:   PT Evaluation $PT Eval Low Complexity: 1 Procedure     PT G CodesShanna Cisco 09/05/2015, 4:49 PM  09/05/2015 [place], [place]

## 2015-09-06 DIAGNOSIS — I200 Unstable angina: Secondary | ICD-10-CM

## 2015-09-06 DIAGNOSIS — I25110 Atherosclerotic heart disease of native coronary artery with unstable angina pectoris: Secondary | ICD-10-CM

## 2015-09-06 DIAGNOSIS — R079 Chest pain, unspecified: Secondary | ICD-10-CM

## 2015-09-06 DIAGNOSIS — D696 Thrombocytopenia, unspecified: Secondary | ICD-10-CM

## 2015-09-06 LAB — CBC
HCT: 37.5 % — ABNORMAL LOW (ref 39.0–52.0)
Hemoglobin: 12 g/dL — ABNORMAL LOW (ref 13.0–17.0)
MCH: 29.9 pg (ref 26.0–34.0)
MCHC: 32 g/dL (ref 30.0–36.0)
MCV: 93.3 fL (ref 78.0–100.0)
Platelets: 95 10*3/uL — ABNORMAL LOW (ref 150–400)
RBC: 4.02 MIL/uL — ABNORMAL LOW (ref 4.22–5.81)
RDW: 12.8 % (ref 11.5–15.5)
WBC: 6.8 10*3/uL (ref 4.0–10.5)

## 2015-09-06 LAB — COMPREHENSIVE METABOLIC PANEL
ALT: 10 U/L — ABNORMAL LOW (ref 17–63)
AST: 15 U/L (ref 15–41)
Albumin: 2.4 g/dL — ABNORMAL LOW (ref 3.5–5.0)
Alkaline Phosphatase: 55 U/L (ref 38–126)
Anion gap: 7 (ref 5–15)
BUN: 13 mg/dL (ref 6–20)
CO2: 20 mmol/L — ABNORMAL LOW (ref 22–32)
Calcium: 8.2 mg/dL — ABNORMAL LOW (ref 8.9–10.3)
Chloride: 110 mmol/L (ref 101–111)
Creatinine, Ser: 1.04 mg/dL (ref 0.61–1.24)
GFR calc Af Amer: >60 mL/min (ref >60)
GFR calc non Af Amer: >60 mL/min (ref >60)
Glucose, Bld: 86 mg/dL (ref 65–99)
Potassium: 4.1 mmol/L (ref 3.5–5.1)
Sodium: 137 mmol/L (ref 135–145)
Total Bilirubin: 1.2 mg/dL (ref 0.3–1.2)
Total Protein: 4.9 g/dL — ABNORMAL LOW (ref 6.5–8.1)

## 2015-09-06 LAB — TROPONIN I
Troponin I: 0.03 ng/mL (ref <0.031)
Troponin I: 0.03 ng/mL (ref <0.031)

## 2015-09-06 LAB — SEDIMENTATION RATE: Sed Rate: 18 mm/hr — ABNORMAL HIGH (ref 0–16)

## 2015-09-06 MED ORDER — LORAZEPAM 0.5 MG PO TABS
0.5000 mg | ORAL_TABLET | Freq: Once | ORAL | Status: AC
Start: 2015-09-06 — End: 2015-09-06
  Administered 2015-09-06: 0.5 mg via ORAL
  Filled 2015-09-06: qty 1

## 2015-09-06 NOTE — Progress Notes (Signed)
Patient ID: Evan [name], male   DOB: January 18, 1928, 82 y.o.   MRN: [id number]                                                                PROGRESS NOTE                                                                                                                                                                                                             Patient Demographics:    Evan [name], is a 82 y.o. male, DOB - 1927/06/03, WYO:[id number]  Admit date - 09/04/2015   Admitting Physician Mir Marry Guan, MD  Outpatient Primary MD for the patient is Odette Fraction, MD  LOS - 2  Outpatient Specialists:   Chief Complaint  Patient presents with  . Weakness  . Wound Check       Brief Narrative   Evan [name] is a 82 y.o. male presenting with 3 day history of generalized weakness, increasing difficulty walking resulting in a fall yesterday, but without LOC or injury. Mild is reported by his daughter,as patient was not taking enough liquid POs. He developed abnormal skin rashes in left arm and left axilla for at least 3-4 few weeks initially treated with topical creams but progressing to raw, beefy appearance of the area. He had been started on Keflex yesterday, which suspicious for cellulitis. Of note, he had had a skin biopsy of the area by a dermatologist last week which results are pending. Denies fevers, chills, night sweats, vision changes, or mucositis. Denies any respiratory complaints. Denies any chest pain or palpitations. Denies lower extremity swelling. Denies nausea, heartburn or change in bowel habits. Denies abdominal pain. Appetite is decreased. Denies any dysuria. Denies any bleeding issues such as epistaxis, hematemesis, hematuria or hematochezia. Denies any sick contacts    Subjective:    Evan [name] today has feel fine,  Rash is improved.  Cp yesterday in the late afternoon about  5-6pm.  Pt states that it was actually left shoulder pain.  Denies fever, chills,  palp, sob.     Assessment  & Plan :    Active Problems:   Cellulitis   1. Cellulitis (mrsa)  improving on vanco Cont vanco D#3  2. Cp, somewhat atypical but with obvious risk factors.  Cardiology consult May need stress testing  3. ? Tinea  lamisil cream topically bid  4. Bph symptoms Increase flomax 0.'4mg'$  po bid  5. Hypertension uncontrolled Clonidine prn Increase flomax Cont atenolol  6. CAD Cont plavix, atenolol  Code Status : FULL CODE  Family Communication  :  w sone 6/3  Disposition Plan  : ? snf for iv abx  Barriers For Discharge :   Consults  :  cardiology  Procedures  : CXR   DVT Prophylaxis  :  Heparin - SCDs   Lab Results  Component Value Date   PLT 95* 09/06/2015    Antibiotics  :  vanco  Anti-infectives    Start     Dose/Rate Route Frequency Ordered Stop   09/05/15 1700  vancomycin (VANCOCIN) 1,250 mg in sodium chloride 0.9 % 250 mL IVPB     1,250 mg 166.7 mL/hr over 90 Minutes Intravenous Every 24 hours 09/04/15 1607     09/04/15 2300  piperacillin-tazobactam (ZOSYN) IVPB 3.375 g  Status:  Discontinued     3.375 g 12.5 mL/hr over 240 Minutes Intravenous Every 8 hours 09/04/15 1607 09/05/15 1713   09/04/15 1600  vancomycin (VANCOCIN) 1,500 mg in sodium chloride 0.9 % 500 mL IVPB     1,500 mg 250 mL/hr over 120 Minutes Intravenous  Once 09/04/15 1527 09/04/15 2315   09/04/15 1600  piperacillin-tazobactam (ZOSYN) IVPB 3.375 g     3.375 g 100 mL/hr over 30 Minutes Intravenous  Once 09/04/15 1527 09/04/15 1722   09/04/15 1115  azithromycin (ZITHROMAX) 500 mg in dextrose 5 % 250 mL IVPB     500 mg 250 mL/hr over 60 Minutes Intravenous  Once 09/04/15 1110 09/04/15 1224   09/04/15 0900  cefTRIAXone (ROCEPHIN) 1 g in dextrose 5 % 50 mL IVPB     1 g 100 mL/hr over 30 Minutes Intravenous  Once 09/04/15 0850 09/04/15 1008        Objective:   Filed Vitals:   09/05/15 1241 09/05/15 2138 09/06/15 0457 09/06/15 0900  BP: 175/76 119/45 117/52  131/73  Pulse: 90 63 49 73  Temp: 98 F (36.7 C) 98 F (36.7 C) 98.1 F (36.7 C)   TempSrc: Oral  Oral   Resp: '18 18 18   '$ Height:      Weight:      SpO2: 96% 95% 92%     Wt Readings from Last 3 Encounters:  09/04/15 81.647 kg (180 lb)  08/20/15 81.647 kg (180 lb)  08/04/15 81.647 kg (180 lb)     Intake/Output Summary (Last 24 hours) at 09/06/15 1012 Last data filed at 09/06/15 1009  Gross per 24 hour  Intake 2591.25 ml  Output    775 ml  Net 1816.25 ml     Physical Exam  Awake Alert, Oriented X 3, No new F.N deficits, Normal affect [place].AT,PERRAL Supple Neck,No JVD, No cervical lymphadenopathy appriciated.  Symmetrical Chest wall movement, Good air movement bilaterally, CTAB RRR,No Gallops,Rubs or new Murmurs, No Parasternal Heave +ve B.Sounds, Abd Soft, No tenderness, No organomegaly appriciated, No rebound - guarding or rigidity. No Cyanosis, Clubbing or edema, redness over the left arm, and left axilla improved.      Data Review:    CBC  Recent Labs Lab 09/04/15 0845 09/05/15 0504 09/06/15 0548  WBC 10.9* 8.0 6.8  HGB 14.5 12.7* 12.0*  HCT 45.7 39.8 37.5*  PLT 143* 104* 95*  MCV 95.8 94.3 93.3  MCH 30.4 30.1 29.9  MCHC 31.7  31.9 32.0  RDW 13.1 12.9 12.8  LYMPHSABS 1.7  --   --   MONOABS 1.1*  --   --   EOSABS 0.1  --   --   BASOSABS 0.0  --   --     Chemistries   Recent Labs Lab 09/04/15 0845 09/05/15 0504 09/06/15 0548  NA 137 135 137  K 4.6 4.6 4.1  CL 105 105 110  CO2 24 23 20*  GLUCOSE 143* 96 86  BUN '19 14 13  '$ CREATININE 1.42* 1.12 1.04  CALCIUM 8.7* 8.3* 8.2*  AST '23 16 15  '$ ALT 14* 12* 10*  ALKPHOS 69 53 55  BILITOT 1.6* 1.7* 1.2   ------------------------------------------------------------------------------------------------------------------ No results for input(s): CHOL, HDL, LDLCALC, TRIG, CHOLHDL, LDLDIRECT in the last 72 hours.  No results found for:  HGBA1C ------------------------------------------------------------------------------------------------------------------ No results for input(s): TSH, T4TOTAL, T3FREE, THYROIDAB in the last 72 hours.  Invalid input(s): FREET3 ------------------------------------------------------------------------------------------------------------------ No results for input(s): VITAMINB12, FOLATE, FERRITIN, TIBC, IRON, RETICCTPCT in the last 72 hours.  Coagulation profile  Recent Labs Lab 09/05/15 0504  INR 1.17    No results for input(s): DDIMER in the last 72 hours.  Cardiac Enzymes  Recent Labs Lab 09/05/15 1814 09/05/15 2351 09/06/15 0548  TROPONINI 0.04* 0.03 0.03   ------------------------------------------------------------------------------------------------------------------ No results found for: BNP  Inpatient Medications  Scheduled Meds: . atenolol  50 mg Oral Daily  . clopidogrel  75 mg Oral Daily  . FLUoxetine  20 mg Oral Daily  . heparin  5,000 Units Subcutaneous Q8H  . Lifitegrast  1 drop Ophthalmic BID  . pantoprazole  40 mg Oral BID  . saccharomyces boulardii  250 mg Oral BID  . tamsulosin  0.4 mg Oral BID AC  . terbinafine   Topical BID  . vancomycin  1,250 mg Intravenous Q24H   Continuous Infusions: . sodium chloride 75 mL/hr at 09/04/15 1651   PRN Meds:.acetaminophen **OR** acetaminophen, bisacodyl, cloNIDine, HYDROcodone-acetaminophen, hydrOXYzine, magnesium citrate, morphine injection, ondansetron **OR** ondansetron (ZOFRAN) IV, senna-docusate, traZODone  Micro Results Recent Results (from the past 240 hour(s))  Blood culture (routine x 2)     Status: Abnormal (Preliminary result)   Collection Time: 09/04/15  8:55 AM  Result Value Ref Range Status   Specimen Description BLOOD RIGHT ARM  Final   Special Requests BOTTLES DRAWN AEROBIC AND ANAEROBIC  5CC  Final   Culture  Setup Time   Final    GRAM POSITIVE COCCI IN CLUSTERS AEROBIC BOTTLE ONLY CRITICAL  RESULT CALLED TO, READ BACK BY AND VERIFIED WITH: MYER,J PHARMD 09/05/15 1707 [place]    Culture STAPHYLOCOCCUS SPECIES (COAGULASE NEGATIVE) (A)  Final   Report Status PENDING  Incomplete  Blood Culture ID Panel (Reflexed)     Status: Abnormal   Collection Time: 09/04/15  8:55 AM  Result Value Ref Range Status   Enterococcus species NOT DETECTED NOT DETECTED Final   Vancomycin resistance NOT DETECTED NOT DETECTED Final   Listeria monocytogenes NOT DETECTED NOT DETECTED Final   Staphylococcus species DETECTED (A) NOT DETECTED Final    Comment: CRITICAL RESULT CALLED TO, READ BACK BY AND VERIFIED WITH: MYER,J PHARMD 09/05/15 1707 [place],K    Staphylococcus aureus NOT DETECTED NOT DETECTED Final   Methicillin resistance DETECTED (A) NOT DETECTED Final    Comment: CRITICAL RESULT CALLED TO, READ BACK BY AND VERIFIED WITH: MYER,J PHARMD 09/05/15 1707 [place]OT[place],K    Streptococcus species NOT DETECTED NOT DETECTED Final   Streptococcus agalactiae NOT DETECTED NOT DETECTED Final  Streptococcus pneumoniae NOT DETECTED NOT DETECTED Final   Streptococcus pyogenes NOT DETECTED NOT DETECTED Final   Acinetobacter baumannii NOT DETECTED NOT DETECTED Final   Enterobacteriaceae species NOT DETECTED NOT DETECTED Final   Enterobacter cloacae complex NOT DETECTED NOT DETECTED Final   Escherichia coli NOT DETECTED NOT DETECTED Final   Klebsiella oxytoca NOT DETECTED NOT DETECTED Final   Klebsiella pneumoniae NOT DETECTED NOT DETECTED Final   Proteus species NOT DETECTED NOT DETECTED Final   Serratia marcescens NOT DETECTED NOT DETECTED Final   Carbapenem resistance NOT DETECTED NOT DETECTED Final   Haemophilus influenzae NOT DETECTED NOT DETECTED Final   Neisseria meningitidis NOT DETECTED NOT DETECTED Final   Pseudomonas aeruginosa NOT DETECTED NOT DETECTED Final   Candida albicans NOT DETECTED NOT DETECTED Final   Candida glabrata NOT DETECTED NOT DETECTED Final   Candida krusei NOT DETECTED NOT DETECTED  Final   Candida parapsilosis NOT DETECTED NOT DETECTED Final   Candida tropicalis NOT DETECTED NOT DETECTED Final  Blood culture (routine x 2)     Status: None (Preliminary result)   Collection Time: 09/04/15  9:00 AM  Result Value Ref Range Status   Specimen Description BLOOD RIGHT ANTECUBITAL  Final   Special Requests BOTTLES DRAWN AEROBIC AND ANAEROBIC  10CC  Final   Culture NO GROWTH < 24 HOURS  Final   Report Status PENDING  Incomplete  Culture, blood (routine x 2)     Status: None (Preliminary result)   Collection Time: 09/04/15  3:55 PM  Result Value Ref Range Status   Specimen Description BLOOD RIGHT ANTECUBITAL  Final   Special Requests BOTTLES DRAWN AEROBIC AND ANAEROBIC 5CC  Final   Culture NO GROWTH < 24 HOURS  Final   Report Status PENDING  Incomplete  Culture, blood (routine x 2)     Status: None (Preliminary result)   Collection Time: 09/04/15  4:02 PM  Result Value Ref Range Status   Specimen Description BLOOD RIGHT HAND  Final   Special Requests BOTTLES DRAWN AEROBIC AND ANAEROBIC 5CC  Final   Culture NO GROWTH < 24 HOURS  Final   Report Status PENDING  Incomplete    Radiology Reports X-ray Chest Pa And Lateral  09/05/2015  CLINICAL DATA:  Community-acquired pneumonia. Left upper chest pain. EXAM: CHEST  2 VIEW COMPARISON:  09/04/2015 FINDINGS: Prior CABG. No confluent airspace opacities. Heart is borderline in size. No effusions or acute bony abnormality. IMPRESSION: No active cardiopulmonary disease. Electronically Signed   By: Rolm Baptise M.D.   On: 09/05/2015 10:15   Ct Head Wo Contrast  09/04/2015  CLINICAL DATA:  Worsening weakness beginning 3 days ago. Left-sided rash. EXAM: CT HEAD WITHOUT CONTRAST TECHNIQUE: Contiguous axial images were obtained from the base of the skull through the vertex without intravenous contrast. COMPARISON:  01/26/2014 FINDINGS: The brain shows mild age related atrophy. There chronic changes of small vessel disease within the deep white  matter. There is an old thalamic infarction on the left. No sign of acute infarction, mass lesion, hemorrhage, hydrocephalus or extra-axial collection. Previous mastoidectomy on the right. Chronic mucoperiosteal inflammatory changes of the left maxillary sinus. There is atherosclerotic calcification of the major vessels at the base of the brain. IMPRESSION: No acute finding by CT. 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 Electronically Signed   By: Nelson Chimes M.D.   On: 09/04/2015 09:55   Dg Chest Portable 1 View  09/04/2015  CLINICAL DATA:  Altered  mental status EXAM: PORTABLE CHEST 1 VIEW COMPARISON:  01/26/2014 FINDINGS: Patient is status post median sternotomy. Mild to moderate cardiac enlargement. Vascular pattern normal. Lungs clear. No pleural effusion. IMPRESSION: Cardiac enlargement similar to prior study. No acute findings otherwise. Electronically Signed   By: Skipper Cliche M.D.   On: 09/04/2015 11:25    Time Spent in minutes  30   Jani Gravel M.D on 09/06/2015 at 10:12 AM  Between 7am to 7pm - Pager - [phone]  After 7pm go to [email or web address] - password [facility]  [facility] -  Office  [phone]

## 2015-09-06 NOTE — Consult Note (Signed)
Cardiology Consult    Patient ID: Evan [name] MRN: [id number], DOB/AGE: 1927-12-02   Admit date: 09/04/2015 Date of Consult: 09/06/2015  Primary Physician: Odette Fraction, MD Primary Cardiologist: Dr. Irish Lack Requesting Provider: Dr. Haskel [facility]  Patient Profile    82 yo male with PMH of CAD s/p CABG in 1998 and subsequent PCI with stent to LCx in 2008, HTN, HL, hemochromatosis who presented to the [facility] ED with increased weakness and cellulitis to the left upper extremity. Also reports chest pain during admission.   Past Medical History   Past Medical History  Diagnosis Date  . CAD (coronary artery disease)   . BPH (benign prostatic hyperplasia)   . GERD (gastroesophageal reflux disease)   . Hypertension   . Anxiety   . History of ASCVD     MULTIVESSEL  . Anginal chest pain at rest [facility])     Chronic non, controlled on Lorazepam  . Hyperlipidemia   . Hemochromatosis     Possible, elevated iron stores, hook-like osteophytes on hand films, normal LFTs  . Esophageal ulcer without bleeding     bid ppi indefinitely  . Complication of anesthesia     "problems making water afterwards"  . Family history of adverse reaction to anesthesia     "children w/PONV"  . Myocardial infarction ([place]) 1999  . Pneumonia     "several times; got a little now" (09/04/2015)  . Hepatitis     "yellow jaundice as a baby"  . Headache     "couple/week maybe" (09/04/2015)  . Arthritis     "all over"   . DJD (degenerative joint disease)   . Rheumatoid arthritis ([place])   . Osteoarthritis   . Chronic lower back pain   . Depression     Past Surgical History  Procedure Laterality Date  . Cholecystectomy open    . Shoulder open rotator cuff repair Right   . Total knee arthroplasty Bilateral   . Coronary artery bypass graft  1999  . Descending aortic aneurysm repair w/ stent      DE stent ostium into the right radial free graft at OM-, 02-2007  . Esophagogastroduodenoscopy N/A  11/09/2014    Procedure: ESOPHAGOGASTRODUODENOSCOPY (EGD);  Surgeon: [name], MD;  Location: [facility] ENDOSCOPY;  Service: Endoscopy;  Laterality: N/A;  . Inguinal hernia repair    . Joint replacement    . Nasal sinus surgery    . Arm skin lesion biopsy / excision Left 09/02/2015  . Coronary angioplasty with stent placement    . Cataract extraction w/ intraocular lens  implant, bilateral Bilateral   . Corneal transplant Bilateral     "one at [facility]; one at [facility]"     Allergies  Allergies  Allergen Reactions  . Feldene [Piroxicam] Other (See Comments)    REACTION: Blisters  . Statins Other (See Comments)    Myalgias  . Doxycycline Other (See Comments)    REACTION:  unknown  . Tequin Anxiety  . Valium Other (See Comments)    REACTION:  unknown    History of Present Illness    Evan [name] is a 82 year old male with a past medical history of CAD/week CABG in 1998's with subsequent PCI stent to LCx in 2008/HTN/HLD and hemochromatosis. He was last seen in the office by Richardson Dopp on 08/02/2015 for preoperative surgical clearance for EGD and possible esophageal dilatation. At that time he reported no complaints and medications were continued.  Most recently he was admitted on 09/04/2015 with 3  day history of generalized weakness, increasing difficulty walking resulting in a fall and poor by mouth intake. He had developed a skin rash to the left arm and axilla lasting 3-4 weeks initially and placed on Keflex in the outpatient setting for possible cellulitis. In the ED he was noted to have some confusion, but CT of the head was negative for acute findings. Initial lactic acid was elevated at 3.12, he was treated with Rocephin and IV fluids. He was admitted to internal medicine for continued antibiotic therapy related to upper extremity cellulitis.  Yesterday he reported to the admitting team that he had an episode of left-sided chest pressure while walking to the bathroom. In talking with patient he  reports a baseline of intermittent chest pain at home over many years. Currently lives at home with his wife, and climbs stairs in his home on a regular basis without experiencing chest pain/dyspnea. States that these episodes don't seem to be associated with rest or exertion. Has not taken nitroglycerin for these in many years. Does report having a total of 2 episodes of chest pain during admission both of these with exertional activity, and lasting a couple hours. He feels these episodes are more likely related to arthritis in his left shoulder, but EKG was obtained showing sinus rhythm with new T-wave inversion in V3 through V6. Troponins were cycled 3 and negative. Does not currently report chest pain. Of note patient does have large area of cellulitis in left upper arm extending into the axilla that he is currently being treated for with IV antibiotics.  Cardiology has been consulted in relation to patient's report of chest pain, and new EKG changes.  Inpatient Medications    . atenolol  50 mg Oral Daily  . clopidogrel  75 mg Oral Daily  . FLUoxetine  20 mg Oral Daily  . heparin  5,000 Units Subcutaneous Q8H  . Lifitegrast  1 drop Ophthalmic BID  . pantoprazole  40 mg Oral BID  . saccharomyces boulardii  250 mg Oral BID  . tamsulosin  0.4 mg Oral BID AC  . terbinafine   Topical BID  . vancomycin  1,250 mg Intravenous Q24H    Family History    Family History  Problem Relation Age of Onset  . Arthritis-Osteo Sister     Social History    Social History   Social History  . Marital Status: Widowed    Spouse Name: N/A  . Number of Children: N/A  . Years of Education: N/A   Occupational History  . Not on file.   Social History Main Topics  . Smoking status: Never Smoker   . Smokeless tobacco: Former Systems developer    Types: Chew     Comment: "quit chewing in the 1960s"  . Alcohol Use: No  . Drug Use: No  . Sexual Activity: Not on file   Other Topics Concern  . Not on file    Social History Narrative     Review of Systems    General:  No chills, fever, night sweats or weight changes.  Cardiovascular:  + chest pain, dyspnea on exertion, edema, orthopnea, palpitations, paroxysmal nocturnal dyspnea. Dermatological: No rash, lesions/masses Respiratory: No cough, dyspnea Urologic: No hematuria, dysuria Abdominal:   No nausea, vomiting, diarrhea, bright red blood per rectum, melena, or hematemesis Neurologic:  No visual changes, wkns, changes in mental status. All other systems reviewed and are otherwise negative except as noted above.  Physical Exam    Blood pressure 131/73, pulse  73, temperature 98.1 F (36.7 C), temperature source Oral, resp. rate 18, height '5\' 3"'$  (1.6 m), weight 180 lb (81.647 kg), SpO2 92 %.  General: Pleasant older, NAD Psych: Normal affect. Neuro: Alert and oriented X 3. Moves all extremities spontaneously. HEENT: Normal  Neck: Supple without bruits or JVD. Lungs:  Resp regular and unlabored, CTA. Heart: RRR no s3, s4, 2/6 systolic murmur. Abdomen: Soft, non-tender, non-distended, BS + x 4.  Extremities: No clubbing, cyanosis or edema. DP/PT/Radials 2+ and equal bilaterally. LUE with cellulitis extending into axilla and lateral chest wall.   Labs    Troponin (Point of Care Test) No results for input(s): TROPIPOC in the last 72 hours.  Recent Labs  09/05/15 1814 09/05/15 2351 09/06/15 0548  TROPONINI 0.04* 0.03 0.03   Lab Results  Component Value Date   WBC 6.8 09/06/2015   HGB 12.0* 09/06/2015   HCT 37.5* 09/06/2015   MCV 93.3 09/06/2015   PLT 95* 09/06/2015    Recent Labs Lab 09/06/15 0548  NA 137  K 4.1  CL 110  CO2 20*  BUN 13  CREATININE 1.04  CALCIUM 8.2*  PROT 4.9*  BILITOT 1.2  ALKPHOS 55  ALT 10*  AST 15  GLUCOSE 86   Lab Results  Component Value Date   CHOL 98 05/05/2014   HDL 33.00* 05/05/2014   LDLCALC 48 05/05/2014   TRIG 84.0 05/05/2014   No results found for: [facility]   Radiology  Studies    X-ray Chest Pa And Lateral  09/05/2015  CLINICAL DATA:  Community-acquired pneumonia. Left upper chest pain. EXAM: CHEST  2 VIEW COMPARISON:  09/04/2015 FINDINGS: Prior CABG. No confluent airspace opacities. Heart is borderline in size. No effusions or acute bony abnormality. IMPRESSION: No active cardiopulmonary disease. Electronically Signed   By: Rolm Baptise M.D.   On: 09/05/2015 10:15   Ct Head Wo Contrast  09/04/2015  CLINICAL DATA:  Worsening weakness beginning 3 days ago. Left-sided rash. EXAM: CT HEAD WITHOUT CONTRAST TECHNIQUE: Contiguous axial images were obtained from the base of the skull through the vertex without intravenous contrast. COMPARISON:  01/26/2014 FINDINGS: The brain shows mild age related atrophy. There chronic changes of small vessel disease within the deep white matter. There is an old thalamic infarction on the left. No sign of acute infarction, mass lesion, hemorrhage, hydrocephalus or extra-axial collection. Previous mastoidectomy on the right. Chronic mucoperiosteal inflammatory changes of the left maxillary sinus. There is atherosclerotic calcification of the major vessels at the base of the brain. IMPRESSION: No acute finding by CT. 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 Electronically Signed   By: Nelson Chimes M.D.   On: 09/04/2015 09:55   Dg Chest Portable 1 View  09/04/2015  CLINICAL DATA:  Altered mental status EXAM: PORTABLE CHEST 1 VIEW COMPARISON:  01/26/2014 FINDINGS: Patient is status post median sternotomy. Mild to moderate cardiac enlargement. Vascular pattern normal. Lungs clear. No pleural effusion. IMPRESSION: Cardiac enlargement similar to prior study. No acute findings otherwise. Electronically Signed   By: Skipper Cliche M.D.   On: 09/04/2015 11:25    ECG & Cardiac Imaging    EKG: SR Rate-60 TWI v3-v6.    Assessment & Plan    Evan [name] is a 82 year old male with a past medical  history of CAD/week CABG in 1998's with subsequent PCI stent to LCx in 2008/HTN/HLD and hemochromatosis. Most recently he was admitted on 09/04/2015 with 3 day history of generalized weakness,  increasing difficulty walking resulting in a fall and poor by mouth intake. He had developed a skin rash to the left arm and axilla lasting 3-4 weeks initially and placed on Keflex in the outpatient setting for possible cellulitis. In the ED he was noted to have some confusion, but CT of the head was negative for acute findings. Initial lactic acid was elevated at 3.12, he was treated with Rocephin and IV fluids. He was admitted to internal medicine for continued antibiotic therapy related to upper extremity cellulitis. Yesterday he reported to the admitting team that he had an episode of left-sided chest pressure while walking to the bathroom.    1. Chest pain/CAD: Reports that he has chest pain on a regular basis at home that is not typically associated with rest or activity. States he has nitroglycerin at home but has not taken any for this chest pain in the past couple of years. Has had 2 episodes of left-sided chest/arm pain during admission for cellulitis to the left upper extremity. These episodes were present with exertion while walking to the bathroom. Wife does report that they seem to be precipitated by him receiving IV fluids, and has episodes of shaking and chills prior to having the pain. --Does have CAD history with CABG in 1998 and subsequent PCI to LCx in 2008. EKG did show sinus rhythm with new T-wave inversion in V3 through V6  From previous tracing in 08/03/2015 which is concerning. Trop cycled and neg x3.  --Given his history and reports of exertional chest pressure along with new EKG changes, will need further cardiac work up. Will make NPO with plans to reevalute in the morning regarding the new for stress test vs. Cath.  --I did note a new systolic murmur on exam, that has not been noted in the hx  prior. No 2D echo completed on file. Will order echo.  2. Cellulitis: per primary team   3. HTN: Controlled   Signed, Reino Bellis, NP-C Pager [phone] 09/06/2015, 11:09 AM  I have seen and examined the patient along with Reino Bellis, NP-C.  I have reviewed the chart, notes and new data.  I agree with NP's note.  Key new complaints: he seems to be having to types of left chest pain and it is difficult to discern them entirely. His cellulitis is severe and clearly causing tenderness, but the left upper chest and left shoulder discomfort started before the rash and the pattern is worrisome for crescendo angina. Key examination changes: aortic ejection murmur, not previously described Key new findings / data: 1/3 troponin assays was marginally elevated, likely not a significant finding. Thrombocytopenia noted (95K): a recent and precipitous drop (143K 2 days ago).  PLAN: Echo. Lexiscan Myoview (while an argument can be made to proceed directly with angiography, I think we need to allow some time for the cellulitis to subside and clarify the course of his thrombocytopenia). Stop all heaprin products. It is too early for HIT on this admission, but it is possible he is "pre-sensitized" since he's received heparin in the past. Use SCDs for DVT prevention. Check for HIT. Continue clopidogrel for now, stop if platelets continue to drop or there is overt bleeding.   Sanda Klein, MD, [place] [phone] 09/06/2015, 12:42 PM

## 2015-09-06 NOTE — Progress Notes (Signed)
Spent 45 minutes with patient and family discussing PICC procedure, risks and benefits.  Agreeable to PICC placement, but all present prefer to wait until after cardiac stress test and possible cardiac cath scheduled for 09-07-2015.  PIV working well, PICC is for d/c to home for long term antibiotics with no d/c date at present.  RN notified.  PICC consent signed by patient.

## 2015-09-07 ENCOUNTER — Inpatient Hospital Stay (HOSPITAL_COMMUNITY): Payer: Commercial Managed Care - HMO

## 2015-09-07 DIAGNOSIS — R072 Precordial pain: Secondary | ICD-10-CM

## 2015-09-07 DIAGNOSIS — R011 Cardiac murmur, unspecified: Secondary | ICD-10-CM

## 2015-09-07 DIAGNOSIS — B957 Other staphylococcus as the cause of diseases classified elsewhere: Secondary | ICD-10-CM

## 2015-09-07 DIAGNOSIS — R079 Chest pain, unspecified: Secondary | ICD-10-CM

## 2015-09-07 DIAGNOSIS — L03112 Cellulitis of left axilla: Principal | ICD-10-CM

## 2015-09-07 LAB — CULTURE, BLOOD (ROUTINE X 2)

## 2015-09-07 LAB — ECHOCARDIOGRAM COMPLETE
AO mean calculated velocity dopler: 105 cm/s
AV Area VTI index: 1.72 cm2/m2
AV Area VTI: 2.63 cm2
AV Area mean vel: 2.83 cm2
AV Mean grad: 6 mmHg
AV Peak grad: 11 mmHg
AV VEL mean LVOT/AV: 0.58
AV area mean vel ind: 1.46 cm2/m2
AV peak Index: 1.36
AV pk vel: 164 cm/s
AV vel: 3.32
Ao pk vel: 0.54 m/s
E/e' ratio: 6.23
FS: 29 % (ref 28–44)
Height: 63 in
IVS/LV PW RATIO, ED: 1.13
LA ID, A-P, ES: 49 cm
LA diam end sys: 49 cm
LA diam index: 2.53 cm/m2
LA vol A4C: 71.3 ml
LA vol index: 45.5 mL/m2
LA vol: 88 cm3
LV E/e' medial: 6.23
LV E/e'average: 6.23
LV PW d: 12.8 mm — AB (ref 0.6–1.1)
LV e' LATERAL: 11 cm/s
LVOT SV: 73 cm3
LVOT VTI: 14.8 cm
LVOT area: 4.91 cm2
LVOT diameter: 25 mm
LVOT peak VTI: 0.68 cm
LVOT peak vel: 87.8 cm/s
Lateral S' vel: 12.5 cm/s
MV Peak grad: 2 mmHg
MV pk A vel: 90.42 m/s
MV pk E vel: 68.55 m/s
S' Lateral: 12.9 cm/s
TDI e' lateral: 11
TDI e' medial: 5.66
VTI: 21.9 cm
Valve area index: 1.72
Valve area: 3.32 cm2
Weight: 2879.98 oz

## 2015-09-07 LAB — NM MYOCAR MULTI W/SPECT W/WALL MOTION / EF
Estimated workload: 1 METS
MPHR: 134 {beats}/min
Peak HR: 106 {beats}/min
Percent HR: 79 %
Rest HR: 93 {beats}/min

## 2015-09-07 LAB — HEPARIN INDUCED PLATELET AB (HIT ANTIBODY): Heparin Induced Plt Ab: 0.128 OD (ref 0.000–0.400)

## 2015-09-07 MED ORDER — TECHNETIUM TC 99M TETROFOSMIN IV KIT
30.0000 | PACK | Freq: Once | INTRAVENOUS | Status: AC | PRN
  Administered 2015-09-07: 30 via INTRAVENOUS

## 2015-09-07 MED ORDER — REGADENOSON 0.4 MG/5ML IV SOLN
INTRAVENOUS | Status: AC
  Filled 2015-09-07: qty 5

## 2015-09-07 MED ORDER — PERFLUTREN LIPID MICROSPHERE
1.0000 mL | INTRAVENOUS | Status: AC | PRN
  Administered 2015-09-07: 2 mL via INTRAVENOUS
  Filled 2015-09-07: qty 10

## 2015-09-07 MED ORDER — TECHNETIUM TC 99M TETROFOSMIN IV KIT
10.0000 | PACK | Freq: Once | INTRAVENOUS | Status: AC | PRN
  Administered 2015-09-07: 10 via INTRAVENOUS

## 2015-09-07 MED ORDER — REGADENOSON 0.4 MG/5ML IV SOLN
0.4000 mg | Freq: Once | INTRAVENOUS | Status: AC
  Administered 2015-09-07: 0.4 mg via INTRAVENOUS

## 2015-09-07 NOTE — Clinical Documentation Improvement (Signed)
Internal Medicine  Noted  Lactic Acid 3.12, P >90, Resp > 20.  Would either of the following diagnosis be appropriate for this admission? Thank you   Sepsis  Sirs  Other  Clinically Undetermined  Document any associated diagnoses/conditions.   Supporting Information: Pneumonia and Cellulitis with Lactic Acid 3.12, P >90, Resp > 20   Please exercise your independent, professional judgment when responding. A specific answer is not anticipated or expected.   Thank You,  [place] [phone]

## 2015-09-07 NOTE — Clinical Documentation Improvement (Signed)
Internal Medicine  Abnormal Lab/Test Results:  Lactic Acid 3.12  Possible Clinical Conditions associated with below indicators   Acidosis  Other Condition  Cannot Clinically Determine   Supporting Information: CREATININE(mg/dL) 1.04 1.12 1.42H   Treatment Provided:  IV @ 75 ml/hr   Please exercise your independent, professional judgment when responding. A specific answer is not anticipated or expected.   Thank You,  [place] [phone]

## 2015-09-07 NOTE — Progress Notes (Signed)
Patient Name: Evan [name] Date of Encounter: 09/07/2015  Active Problems:   Cellulitis   Chest pain   Primary Cardiologist: Dr. Irish Lack Patient Profile:82 yo male with PMH of CAD s/p CABG in 1998 and subsequent PCI with stent to LCx in 2008, HTN, HL, hemochromatosis who presented to the [facility] ED with increased weakness and cellulitis to the left upper extremity. Also reports chest pain during admission.     SUBJECTIVE: Seen in nuc med, denies chest pain. Feels SOB.   OBJECTIVE Filed Vitals:   09/06/15 1441 09/06/15 2221 09/07/15 0000 09/07/15 0626  BP: 115/50 118/35 137/61 121/103  Pulse: 61 54 62 85  Temp: 98.2 F (36.8 C) 97.6 F (36.4 C)  99.1 F (37.3 C)  TempSrc: Oral   Oral  Resp: '19 19  16  '$ Height:      Weight:      SpO2: 95% 97%  95%    Intake/Output Summary (Last 24 hours) at 09/07/15 1026 Last data filed at 09/07/15 0858  Gross per 24 hour  Intake   1780 ml  Output   1700 ml  Net     80 ml   Filed Weights   09/04/15 1840  Weight: 180 lb (81.647 kg)    PHYSICAL EXAM General: Well developed, well nourished, male in no acute distress. Head: Normocephalic, atraumatic.  Neck: Supple without bruits, no JVD. Lungs:  Resp regular and unlabored, CTA. Heart: RRR, S1, S2, no S3, S4, or murmur; no rub. Abdomen: Soft, non-tender, non-distended, BS + x 4.  Extremities: No clubbing, cyanosis, no edema.  Neuro: Alert and oriented X 3. Moves all extremities spontaneously. Psych: Normal affect.  LABS: CBC: Recent Labs  09/05/15 0504 09/06/15 0548  WBC 8.0 6.8  HGB 12.7* 12.0*  HCT 39.8 37.5*  MCV 94.3 93.3  PLT 104* 95*   INR: Recent Labs  09/05/15 0504  INR 1.61   Basic Metabolic Panel: Recent Labs  09/05/15 0504 09/06/15 0548  NA 135 137  K 4.6 4.1  CL 105 110  CO2 23 20*  GLUCOSE 96 86  BUN 14 13  CREATININE 1.12 1.04  CALCIUM 8.3* 8.2*   Liver Function Tests: Recent Labs  09/05/15 0504 09/06/15 0548  AST 16 15  ALT  12* 10*  ALKPHOS 53 55  BILITOT 1.7* 1.2  PROT 5.1* 4.9*  ALBUMIN 2.5* 2.4*   Cardiac Enzymes: Recent Labs  09/05/15 1814 09/05/15 2351 09/06/15 0548  TROPONINI 0.04* 0.03 0.03   No results for input(s): TROPIPOC in the last 72 hours. BNP: No results found for: BNP PRO B NATRIURETIC PEPTIDE (BNP)  Date/Time Value Ref Range Status  01/26/2014 09:50 AM 648.1* 0 - 450 pg/mL Final  06/18/2011 10:08 PM 800.9* 0 - 450 pg/mL Final   D-dimer:No results for input(s): DDIMER in the last 72 hours. Hemoglobin A1C:No results for input(s): HGBA1C in the last 72 hours. Fasting Lipid Panel:No results for input(s): CHOL, HDL, LDLCALC, TRIG, CHOLHDL, LDLDIRECT in the last 72 hours. Thyroid Function Tests:No results for input(s): TSH, T4TOTAL, T3FREE, THYROIDAB in the last 72 hours.  Invalid input(s): FREET3 Anemia Panel:No results for input(s): VITAMINB12, FOLATE, FERRITIN, TIBC, IRON, RETICCTPCT in the last 72 hours.   Current facility-administered medications:  .  0.9 %  sodium chloride infusion, , Intravenous, Continuous, Rondel Jumbo, PA-C, Last Rate: 75 mL/hr at 09/07/15 0003 .  acetaminophen (TYLENOL) tablet 650 mg, 650 mg, Oral, Q6H PRN **OR** acetaminophen (TYLENOL) suppository 650 mg, 650  mg, Rectal, Q6H PRN, Rondel Jumbo, PA-C .  atenolol (TENORMIN) tablet 50 mg, 50 mg, Oral, Daily, Rondel Jumbo, PA-C, 50 mg at 09/06/15 0900 .  bisacodyl (DULCOLAX) suppository 10 mg, 10 mg, Rectal, Daily PRN, Rondel Jumbo, PA-C .  cloNIDine (CATAPRES) tablet 0.1 mg, 0.1 mg, Oral, Q6H PRN, Jani Gravel, MD, 0.1 mg at 09/05/15 1511 .  clopidogrel (PLAVIX) tablet 75 mg, 75 mg, Oral, Daily, Rondel Jumbo, PA-C, 75 mg at 09/06/15 0900 .  FLUoxetine (PROZAC) capsule 20 mg, 20 mg, Oral, Daily, Coralee Pesa Wertman, PA-C, 20 mg at 09/06/15 0900 .  HYDROcodone-acetaminophen (NORCO/VICODIN) 5-325 MG per tablet 1-2 tablet, 1-2 tablet, Oral, Q4H PRN, Rondel Jumbo, PA-C, 2 tablet at 09/06/15 1453 .  hydrOXYzine  (ATARAX/VISTARIL) tablet 25 mg, 25 mg, Oral, Q8H PRN, Rondel Jumbo, PA-C, 25 mg at 09/06/15 1736 .  Lifitegrast 5 % SOLN 1 drop, 1 drop, Ophthalmic, BID, Rondel Jumbo, PA-C .  magnesium citrate solution 1 Bottle, 1 Bottle, Oral, Once PRN, Rondel Jumbo, PA-C .  morphine 2 MG/ML injection 1 mg, 1 mg, Intravenous, Q4H PRN, Rondel Jumbo, PA-C .  ondansetron (ZOFRAN) tablet 4 mg, 4 mg, Oral, Q6H PRN **OR** ondansetron (ZOFRAN) injection 4 mg, 4 mg, Intravenous, Q6H PRN, Rondel Jumbo, PA-C .  pantoprazole (PROTONIX) EC tablet 40 mg, 40 mg, Oral, BID, Rondel Jumbo, PA-C, 40 mg at 09/06/15 2303 .  regadenoson (LEXISCAN) 0.4 MG/5ML injection SOLN, , , ,  .  regadenoson (LEXISCAN) injection SOLN 0.4 mg, 0.4 mg, Intravenous, Once, Cheryln Manly, NP .  saccharomyces boulardii (FLORASTOR) capsule 250 mg, 250 mg, Oral, BID, Jani Gravel, MD, 250 mg at 09/06/15 2303 .  senna-docusate (Senokot-S) tablet 1 tablet, 1 tablet, Oral, QHS PRN, Rondel Jumbo, PA-C .  tamsulosin [facility]) capsule 0.4 mg, 0.4 mg, Oral, BID AC, Jani Gravel, MD, 0.4 mg at 09/06/15 1736 .  terbinafine (LAMISIL) 1 % cream, , Topical, BID, Jani Gravel, MD .  traZODone (DESYREL) tablet 50 mg, 50 mg, Oral, QHS PRN, Rondel Jumbo, PA-C, 50 mg at 09/05/15 2224 .  vancomycin (VANCOCIN) 1,250 mg in sodium chloride 0.9 % 250 mL IVPB, 1,250 mg, Intravenous, Q24H, Jake Church Masters, RPH, 1,250 mg at 09/06/15 1830 . sodium chloride 75 mL/hr at 09/07/15 0003    TELE: NSR   ECG: NSR    Current Medications:  . atenolol  50 mg Oral Daily  . clopidogrel  75 mg Oral Daily  . FLUoxetine  20 mg Oral Daily  . Lifitegrast  1 drop Ophthalmic BID  . pantoprazole  40 mg Oral BID  . regadenoson      . regadenoson  0.4 mg Intravenous Once  . saccharomyces boulardii  250 mg Oral BID  . tamsulosin  0.4 mg Oral BID AC  . terbinafine   Topical BID  . vancomycin  1,250 mg Intravenous Q24H   . sodium chloride 75 mL/hr at 09/07/15 0003     ASSESSMENT AND PLAN: Active Problems:   Cellulitis   Chest pain  1. Chest pain/CAD: Reports that he has chest pain on a regular basis at home that is not typically associated with rest or activity. States he has nitroglycerin at home but has not taken any for this chest pain in the past couple of years. Has had 2 episodes of left-sided chest/arm pain during admission for cellulitis to the left upper extremity. These episodes were present with exertion while walking to  the bathroom. Wife does report that they seem to be precipitated by him receiving IV fluids, and has episodes of shaking and chills prior to having the pain. --Does have CAD history with CABG in 1998 and subsequent PCI to LCx in 2008. EKG did show sinus rhythm with new T-wave inversion in V3 through V6 From previous tracing in 08/03/2015 which is concerning. Trop cycled and neg x3.  --Given his history and reports of exertional chest pressure along with new EKG changes, will need further cardiac work up.  --I did note a new systolic murmur on exam, that has not been noted in the hx prior. No 2D echo completed on file. Will order echo.  - Lexiscan Myoview this am, will follow results.   2. Cellulitis: per primary team   3. HTN: Controlled    Signed, Arbutus Leas , NP 10:26 AM 09/07/2015 Pager [phone]  I have examined the patient and reviewed assessment and plan and discussed with patient.  Agree with above as stated.  Stress test pending.  Echo pending.  He feels his chest pain is related to arthritis in the left shoulder.  It is worse with certain movements.  Sx somewhat atypical.  Continue treatment of cellulitis.  Larae Grooms

## 2015-09-07 NOTE — Progress Notes (Signed)
Pharmacy Antibiotic Note  Evan [name] is a 82 y.o. male admitted on 09/04/2015 with cellulitis.  Pharmacy has been consulted for Vancomycin dosing.  Cultures (+)Coag negative staph with Methicillin resistance, not MRSA  Plan: Continue Vancomycin '1250mg'$  IV q24 Vancomycin trough on 6/6 Watch renal function and f/u pending cx  Height: '5\' 3"'$  (160 cm) Weight: 180 lb (81.647 kg) IBW/kg (Calculated) : 56.9  Temp (24hrs), Avg:98.3 F (36.8 C), Min:97.6 F (36.4 C), Max:99.1 F (37.3 C)   Recent Labs Lab 09/04/15 0845 09/04/15 0911 09/04/15 1320 09/05/15 0504 09/06/15 0548  WBC 10.9*  --   --  8.0 6.8  CREATININE 1.42*  --   --  1.12 1.04  LATICACIDVEN  --  3.12* 0.80  --   --     Estimated Creatinine Clearance: 48.2 mL/min (by C-G formula based on Cr of 1.04).    Allergies  Allergen Reactions  . Feldene [Piroxicam] Other (See Comments)    REACTION: Blisters  . Statins Other (See Comments)    Myalgias  . Heparin     ?HIT. Stop all heparin products until HIT ruled out. Follow up  HIT antibody lab ordered on 09/06/15  . Doxycycline Other (See Comments)    REACTION:  unknown  . Tequin Anxiety  . Valium Other (See Comments)    REACTION:  unknown    Antimicrobials this admission: Azith x1 6/2 CTX x1 6/2 Zosyn 6/2 > >6/3 Vanc 6/2>>  Microbiology results: 6/2 (AM)  Bld Cx x 2 ; 1 of 2 GPC cluster pending-Staph CoagNeg methicillin resistant  6/2 (PM) Bcx x2 ngtd  6/2 urine cx no result  Thank you for allowing pharmacy to be a part of this patient's care.  Gracy Bruins, PharmD Clinical Pharmacist [facility]

## 2015-09-07 NOTE — Progress Notes (Signed)
Echocardiogram 2D Echocardiogram has been performed.  Joelene Millin 09/07/2015, 2:07 PM

## 2015-09-07 NOTE — Progress Notes (Signed)
Patient ID: Evan [name], male   DOB: 1927-11-26, 82 y.o.   MRN: [id number]                                                                PROGRESS NOTE                                                                                                                                                                                                             Patient Demographics:    Evan [name], is a 82 y.o. male, DOB - Dec 14, 1927, ASN:[id number]  Admit date - 09/04/2015   Admitting Physician Mir Marry Guan, MD  Outpatient Primary MD for the patient is Odette Fraction, MD  LOS - 3  Outpatient Specialists:   Chief Complaint  Patient presents with  . Weakness  . Wound Check       Brief Narrative   Evan [name] is a 82 y.o. male presenting with 3 day history of generalized weakness, increasing difficulty walking resulting in a fall yesterday, but without LOC or injury. Mild is reported by his daughter,as patient was not taking enough liquid POs. He developed abnormal skin rashes in left arm and left axilla for at least 3-4 few weeks initially treated with topical creams but progressing to raw, beefy appearance of the area. He had been started on Keflex yesterday, which suspicious for cellulitis. Of note, he had had a skin biopsy of the area by a dermatologist last week which results are pending. Denies fevers, chills, night sweats, vision changes, or mucositis. Denies any respiratory complaints. Denies any chest pain or palpitations. Denies lower extremity swelling. Denies nausea, heartburn or change in bowel habits. Denies abdominal pain. Appetite is decreased. Denies any dysuria. Denies any bleeding issues such as epistaxis, hematemesis, hematuria or hematochezia. Denies any sick contacts    Subjective:   No further chest pain C/o "chills"   Assessment  & Plan :    Active Problems:   Cellulitis   Chest pain   Cellulitis vs subcorneal pustular dermatitis (blood culture  1/4 coag neg staph)  improving on vanco -not MRSA on blood culture -d/c PICC line -ID consult -? Po abx  Cp, somewhat atypical but with obvious risk factors.  Low risk stress test  ? Tinea  lamisil cream topically bid  Bph symptoms Increase flomax 0.'4mg'$  po bid   Hypertension uncontrolled Clonidine prn Increase flomax Cont atenolol  CAD Cont plavix, atenolol  Code Status : FULL CODE  Family Communication  :  Wife at bedside  Disposition Plan  : home? Barriers For Discharge :   Consults  :  Cardiology ID  Procedures  : CXR   DVT Prophylaxis  :  Heparin - SCDs   Lab Results  Component Value Date   PLT 95* 09/06/2015    Antibiotics  :  vanco  Anti-infectives    Start     Dose/Rate Route Frequency Ordered Stop   09/05/15 1700  vancomycin (VANCOCIN) 1,250 mg in sodium chloride 0.9 % 250 mL IVPB     1,250 mg 166.7 mL/hr over 90 Minutes Intravenous Every 24 hours 09/04/15 1607     09/04/15 2300  piperacillin-tazobactam (ZOSYN) IVPB 3.375 g  Status:  Discontinued     3.375 g 12.5 mL/hr over 240 Minutes Intravenous Every 8 hours 09/04/15 1607 09/05/15 1713   09/04/15 1600  vancomycin (VANCOCIN) 1,500 mg in sodium chloride 0.9 % 500 mL IVPB     1,500 mg 250 mL/hr over 120 Minutes Intravenous  Once 09/04/15 1527 09/04/15 2315   09/04/15 1600  piperacillin-tazobactam (ZOSYN) IVPB 3.375 g     3.375 g 100 mL/hr over 30 Minutes Intravenous  Once 09/04/15 1527 09/04/15 1722   09/04/15 1115  azithromycin (ZITHROMAX) 500 mg in dextrose 5 % 250 mL IVPB     500 mg 250 mL/hr over 60 Minutes Intravenous  Once 09/04/15 1110 09/04/15 1224   09/04/15 0900  cefTRIAXone (ROCEPHIN) 1 g in dextrose 5 % 50 mL IVPB     1 g 100 mL/hr over 30 Minutes Intravenous  Once 09/04/15 0850 09/04/15 1008        Objective:   Filed Vitals:   09/07/15 1005 09/07/15 1025 09/07/15 1029 09/07/15 1030  BP: 159/79 184/84 186/84   Pulse: 77 99 110 99  Temp:      TempSrc:      Resp:       Height:      Weight:      SpO2:        Wt Readings from Last 3 Encounters:  09/04/15 81.647 kg (180 lb)  08/20/15 81.647 kg (180 lb)  08/04/15 81.647 kg (180 lb)     Intake/Output Summary (Last 24 hours) at 09/07/15 1306 Last data filed at 09/07/15 0858  Gross per 24 hour  Intake   1780 ml  Output   1700 ml  Net     80 ml     Physical Exam  Awake Alert, Oriented X 3, No new F.N deficits, Normal affect  Symmetrical Chest wall movement, Good air movement bilaterally, CTAB RRR,No Gallops,Rubs or new Murmurs, No Parasternal Heave +ve B.Sounds, Abd Soft, No tenderness, No organomegaly appriciated, No rebound - guarding or rigidity. Peeling rash from forearms to axilla    Data Review:    CBC  Recent Labs Lab 09/04/15 0845 09/05/15 0504 09/06/15 0548  WBC 10.9* 8.0 6.8  HGB 14.5 12.7* 12.0*  HCT 45.7 39.8 37.5*  PLT 143* 104* 95*  MCV 95.8 94.3 93.3  MCH 30.4 30.1 29.9  MCHC 31.7 31.9 32.0  RDW 13.1 12.9 12.8  LYMPHSABS 1.7  --   --   MONOABS 1.1*  --   --   EOSABS 0.1  --   --   BASOSABS 0.0  --   --  Chemistries   Recent Labs Lab 09/04/15 0845 09/05/15 0504 09/06/15 0548  NA 137 135 137  K 4.6 4.6 4.1  CL 105 105 110  CO2 24 23 20*  GLUCOSE 143* 96 86  BUN '19 14 13  '$ CREATININE 1.42* 1.12 1.04  CALCIUM 8.7* 8.3* 8.2*  AST '23 16 15  '$ ALT 14* 12* 10*  ALKPHOS 69 53 55  BILITOT 1.6* 1.7* 1.2   ------------------------------------------------------------------------------------------------------------------ No results for input(s): CHOL, HDL, LDLCALC, TRIG, CHOLHDL, LDLDIRECT in the last 72 hours.  No results found for: HGBA1C ------------------------------------------------------------------------------------------------------------------ No results for input(s): TSH, T4TOTAL, T3FREE, THYROIDAB in the last 72 hours.  Invalid input(s):  FREET3 ------------------------------------------------------------------------------------------------------------------ No results for input(s): VITAMINB12, FOLATE, FERRITIN, TIBC, IRON, RETICCTPCT in the last 72 hours.  Coagulation profile  Recent Labs Lab 09/05/15 0504  INR 1.17    No results for input(s): DDIMER in the last 72 hours.  Cardiac Enzymes  Recent Labs Lab 09/05/15 1814 09/05/15 2351 09/06/15 0548  TROPONINI 0.04* 0.03 0.03   ------------------------------------------------------------------------------------------------------------------ No results found for: BNP  Inpatient Medications  Scheduled Meds: . atenolol  50 mg Oral Daily  . clopidogrel  75 mg Oral Daily  . FLUoxetine  20 mg Oral Daily  . Lifitegrast  1 drop Ophthalmic BID  . pantoprazole  40 mg Oral BID  . regadenoson      . saccharomyces boulardii  250 mg Oral BID  . tamsulosin  0.4 mg Oral BID AC  . terbinafine   Topical BID  . vancomycin  1,250 mg Intravenous Q24H   Continuous Infusions: . sodium chloride 75 mL/hr at 09/07/15 0003   PRN Meds:.acetaminophen **OR** acetaminophen, bisacodyl, cloNIDine, HYDROcodone-acetaminophen, hydrOXYzine, magnesium citrate, morphine injection, ondansetron **OR** ondansetron (ZOFRAN) IV, senna-docusate, traZODone  Micro Results Recent Results (from the past 240 hour(s))  Blood culture (routine x 2)     Status: Abnormal   Collection Time: 09/04/15  8:55 AM  Result Value Ref Range Status   Specimen Description BLOOD RIGHT ARM  Final   Special Requests BOTTLES DRAWN AEROBIC AND ANAEROBIC  5CC  Final   Culture  Setup Time   Final    GRAM POSITIVE COCCI IN CLUSTERS AEROBIC BOTTLE ONLY CRITICAL RESULT CALLED TO, READ BACK BY AND VERIFIED WITH: MYER,J PHARMD 09/05/15 1707 [place]    Culture (A)  Final    STAPHYLOCOCCUS SPECIES (COAGULASE NEGATIVE) THE SIGNIFICANCE OF ISOLATING THIS ORGANISM FROM A SINGLE SET OF BLOOD CULTURES WHEN MULTIPLE SETS ARE DRAWN  IS UNCERTAIN. PLEASE NOTIFY THE MICROBIOLOGY DEPARTMENT WITHIN ONE WEEK IF SPECIATION AND SENSITIVITIES ARE REQUIRED.    Report Status 09/07/2015 FINAL  Final  Blood Culture ID Panel (Reflexed)     Status: Abnormal   Collection Time: 09/04/15  8:55 AM  Result Value Ref Range Status   Enterococcus species NOT DETECTED NOT DETECTED Final   Vancomycin resistance NOT DETECTED NOT DETECTED Final   Listeria monocytogenes NOT DETECTED NOT DETECTED Final   Staphylococcus species DETECTED (A) NOT DETECTED Final    Comment: CRITICAL RESULT CALLED TO, READ BACK BY AND VERIFIED WITH: MYER,J PHARMD 09/05/15 1707 WOOTEN,K    Staphylococcus aureus NOT DETECTED NOT DETECTED Final   Methicillin resistance DETECTED (A) NOT DETECTED Final    Comment: CRITICAL RESULT CALLED TO, READ BACK BY AND VERIFIED WITH: MYER,J PHARMD 09/05/15 1707 WOOTEN,K    Streptococcus species NOT DETECTED NOT DETECTED Final   Streptococcus agalactiae NOT DETECTED NOT DETECTED Final   Streptococcus pneumoniae NOT DETECTED NOT DETECTED Final  Streptococcus pyogenes NOT DETECTED NOT DETECTED Final   Acinetobacter baumannii NOT DETECTED NOT DETECTED Final   Enterobacteriaceae species NOT DETECTED NOT DETECTED Final   Enterobacter cloacae complex NOT DETECTED NOT DETECTED Final   Escherichia coli NOT DETECTED NOT DETECTED Final   Klebsiella oxytoca NOT DETECTED NOT DETECTED Final   Klebsiella pneumoniae NOT DETECTED NOT DETECTED Final   Proteus species NOT DETECTED NOT DETECTED Final   Serratia marcescens NOT DETECTED NOT DETECTED Final   Carbapenem resistance NOT DETECTED NOT DETECTED Final   Haemophilus influenzae NOT DETECTED NOT DETECTED Final   Neisseria meningitidis NOT DETECTED NOT DETECTED Final   Pseudomonas aeruginosa NOT DETECTED NOT DETECTED Final   Candida albicans NOT DETECTED NOT DETECTED Final   Candida glabrata NOT DETECTED NOT DETECTED Final   Candida krusei NOT DETECTED NOT DETECTED Final   Candida parapsilosis  NOT DETECTED NOT DETECTED Final   Candida tropicalis NOT DETECTED NOT DETECTED Final  Blood culture (routine x 2)     Status: None (Preliminary result)   Collection Time: 09/04/15  9:00 AM  Result Value Ref Range Status   Specimen Description BLOOD RIGHT ANTECUBITAL  Final   Special Requests BOTTLES DRAWN AEROBIC AND ANAEROBIC  10CC  Final   Culture NO GROWTH 3 DAYS  Final   Report Status PENDING  Incomplete  Culture, blood (routine x 2)     Status: None (Preliminary result)   Collection Time: 09/04/15  3:55 PM  Result Value Ref Range Status   Specimen Description BLOOD RIGHT ANTECUBITAL  Final   Special Requests BOTTLES DRAWN AEROBIC AND ANAEROBIC 5CC  Final   Culture NO GROWTH 3 DAYS  Final   Report Status PENDING  Incomplete  Culture, blood (routine x 2)     Status: None (Preliminary result)   Collection Time: 09/04/15  4:02 PM  Result Value Ref Range Status   Specimen Description BLOOD RIGHT HAND  Final   Special Requests BOTTLES DRAWN AEROBIC AND ANAEROBIC 5CC  Final   Culture NO GROWTH 3 DAYS  Final   Report Status PENDING  Incomplete    Radiology Reports X-ray Chest Pa And Lateral  09/05/2015  CLINICAL DATA:  Community-acquired pneumonia. Left upper chest pain. EXAM: CHEST  2 VIEW COMPARISON:  09/04/2015 FINDINGS: Prior CABG. No confluent airspace opacities. Heart is borderline in size. No effusions or acute bony abnormality. IMPRESSION: No active cardiopulmonary disease. Electronically Signed   By: Rolm Baptise M.D.   On: 09/05/2015 10:15   Ct Head Wo Contrast  09/04/2015  CLINICAL DATA:  Worsening weakness beginning 3 days ago. Left-sided rash. EXAM: CT HEAD WITHOUT CONTRAST TECHNIQUE: Contiguous axial images were obtained from the base of the skull through the vertex without intravenous contrast. COMPARISON:  01/26/2014 FINDINGS: The brain shows mild age related atrophy. There chronic changes of small vessel disease within the deep white matter. There is an old thalamic  infarction on the left. No sign of acute infarction, mass lesion, hemorrhage, hydrocephalus or extra-axial collection. Previous mastoidectomy on the right. Chronic mucoperiosteal inflammatory changes of the left maxillary sinus. There is atherosclerotic calcification of the major vessels at the base of the brain. IMPRESSION: No acute finding by CT. 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 Electronically Signed   By: Nelson Chimes M.D.   On: 09/04/2015 09:55   Dg Chest Portable 1 View  09/04/2015  CLINICAL DATA:  Altered mental status EXAM: PORTABLE CHEST 1 VIEW COMPARISON:  01/26/2014 FINDINGS: Patient  is status post median sternotomy. Mild to moderate cardiac enlargement. Vascular pattern normal. Lungs clear. No pleural effusion. IMPRESSION: Cardiac enlargement similar to prior study. No acute findings otherwise. Electronically Signed   By: Skipper Cliche M.D.   On: 09/04/2015 11:25    Time Spent in minutes  25 min   Shadie Sweatman U [place] Sab[place] DO on 09/07/2015 at 1:06 PM  Between 7am to 7pm - Pager - [phone]  After 7pm go to [email or web address] - password [facility]  [facility] -  Office  [phone]

## 2015-09-07 NOTE — Consult Note (Signed)
[facility] for Infectious Disease       Reason for Consult: cellulitis    Referring Physician: Dr. Eliseo Squires  Active Problems:   Cellulitis   Chest pain   Pain in the chest   . atenolol  50 mg Oral Daily  . clopidogrel  75 mg Oral Daily  . FLUoxetine  20 mg Oral Daily  . Lifitegrast  1 drop Ophthalmic BID  . pantoprazole  40 mg Oral BID  . regadenoson      . saccharomyces boulardii  250 mg Oral BID  . tamsulosin  0.4 mg Oral BID AC  . terbinafine   Topical BID  . vancomycin  1,250 mg Intravenous Q24H    Recommendations: Stop IV antibiotics augmentin for 3 days   Assessment: He had cellulitis following a biopsy.  Family at bedside reports that there was a lot of purulence, though none now.  No current warmth, no erythema c/w cellulitis.  Now has resolved. Has 1/4 blood culture with CoNS c/w contaminate.     Antibiotics: vancomycin  HPI: Evan [name] is a 82 y.o. male with RA on prednisone and a rash that started on the back, axilla and left arm that was biopsied.  This came back with biopsy of pustular dermatitis.  No fever, no chills.  Was going to get a picc line and IV vancomycin for CoNS.  This has been stopped.   Rash improved and dry now.    Review of Systems:  Constitutional: negative for fevers and chills Gastrointestinal: negative for diarrhea All other systems reviewed and are negative   Past Medical History  Diagnosis Date  . CAD (coronary artery disease)   . BPH (benign prostatic hyperplasia)   . GERD (gastroesophageal reflux disease)   . Hypertension   . Anxiety   . History of ASCVD     MULTIVESSEL  . 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 Hyperlipidemia   . Hemochromatosis     Possible, elevated iron stores, hook-like osteophytes on hand films, normal LFTs  . Esophageal ulcer without bleeding     bid ppi indefinitely  . Complication of anesthesia     "problems making water afterwards"  . Family  history of adverse reaction to anesthesia     "children w/PONV"  . Myocardial infarction ([place]) 1999  . Pneumonia     "several times; got a little now" (09/04/2015)  . Hepatitis     "yellow jaundice as a baby"  . Headache     "couple/week maybe" (09/04/2015)  . Arthritis     "all over"   . DJD (degenerative joint disease)   . Rheumatoid arthritis ([place])   . Osteoarthritis   . Chronic lower back pain   . Depression     Social History  Substance Use Topics  . Smoking status: Never Smoker   . Smokeless tobacco: Former Systems developer    Types: Chew     Comment: "quit chewing in the 1960s"  . Alcohol Use: No    Family History  Problem Relation Age of Onset  . Arthritis-Osteo Sister     Allergies  Allergen Reactions  . Feldene [Piroxicam] Other (See Comments)    REACTION: Blisters  . Statins Other (See Comments)    Myalgias  . Heparin     ?HIT. Stop all heparin products until HIT ruled out. Follow up  HIT antibody lab ordered on 09/06/15  . Doxycycline Other (See Comments)  REACTION:  unknown  . Tequin Anxiety  . Valium Other (See Comments)    REACTION:  unknown    Physical Exam: Constitutional: in no apparent distress and alert  Filed Vitals:   09/07/15 1030 09/07/15 1439  BP:  131/75  Pulse: 99 94  Temp:  99.4 F (37.4 C)  Resp:  22   EYES: anicteric ENMT: no thrush Cardiovascular: Cor RRR Respiratory: CTA B; normal respiratory effort GI: Bowel sounds are normal, liver is not enlarged, spleen is not enlarged Musculoskeletal: no pedal edema noted Skin: negatives: diffuse flaking of skin, redness on left arm, axilla Hematologic: no cervical lad  Lab Results  Component Value Date   WBC 6.8 09/06/2015   HGB 12.0* 09/06/2015   HCT 37.5* 09/06/2015   MCV 93.3 09/06/2015   PLT 95* 09/06/2015    Lab Results  Component Value Date   CREATININE 1.04 09/06/2015   BUN 13 09/06/2015   NA 137 09/06/2015   K 4.1 09/06/2015   CL 110 09/06/2015   CO2 20* 09/06/2015    Lab  Results  Component Value Date   ALT 10* 09/06/2015   AST 15 09/06/2015   ALKPHOS 55 09/06/2015     Microbiology: Recent Results (from the past 240 hour(s))  Blood culture (routine x 2)     Status: Abnormal   Collection Time: 09/04/15  8:55 AM  Result Value Ref Range Status   Specimen Description BLOOD RIGHT ARM  Final   Special Requests BOTTLES DRAWN AEROBIC AND ANAEROBIC  5CC  Final   Culture  Setup Time   Final    GRAM POSITIVE COCCI IN CLUSTERS AEROBIC BOTTLE ONLY CRITICAL RESULT CALLED TO, READ BACK BY AND VERIFIED WITH: MYER,J PHARMD 09/05/15 1707 [place]    Culture (A)  Final    STAPHYLOCOCCUS SPECIES (COAGULASE NEGATIVE) THE SIGNIFICANCE OF ISOLATING THIS ORGANISM FROM A SINGLE SET OF BLOOD CULTURES WHEN MULTIPLE SETS ARE DRAWN IS UNCERTAIN. PLEASE NOTIFY THE MICROBIOLOGY DEPARTMENT WITHIN ONE WEEK IF SPECIATION AND SENSITIVITIES ARE REQUIRED.    Report Status 09/07/2015 FINAL  Final  Blood Culture ID Panel (Reflexed)     Status: Abnormal   Collection Time: 09/04/15  8:55 AM  Result Value Ref Range Status   Enterococcus species NOT DETECTED NOT DETECTED Final   Vancomycin resistance NOT DETECTED NOT DETECTED Final   Listeria monocytogenes NOT DETECTED NOT DETECTED Final   Staphylococcus species DETECTED (A) NOT DETECTED Final    Comment: CRITICAL RESULT CALLED TO, READ BACK BY AND VERIFIED WITH: MYER,J PHARMD 09/05/15 1707 WOOTEN,K    Staphylococcus aureus NOT DETECTED NOT DETECTED Final   Methicillin resistance DETECTED (A) NOT DETECTED Final    Comment: CRITICAL RESULT CALLED TO, READ BACK BY AND VERIFIED WITH: MYER,J PHARMD 09/05/15 1707 WOOTEN,K    Streptococcus species NOT DETECTED NOT DETECTED Final   Streptococcus agalactiae NOT DETECTED NOT DETECTED Final   Streptococcus pneumoniae NOT DETECTED NOT DETECTED Final   Streptococcus pyogenes NOT DETECTED NOT DETECTED Final   Acinetobacter baumannii NOT DETECTED NOT DETECTED Final   Enterobacteriaceae species NOT  DETECTED NOT DETECTED Final   Enterobacter cloacae complex NOT DETECTED NOT DETECTED Final   Escherichia coli NOT DETECTED NOT DETECTED Final   Klebsiella oxytoca NOT DETECTED NOT DETECTED Final   Klebsiella pneumoniae NOT DETECTED NOT DETECTED Final   Proteus species NOT DETECTED NOT DETECTED Final   Serratia marcescens NOT DETECTED NOT DETECTED Final   Carbapenem resistance NOT DETECTED NOT DETECTED Final   Haemophilus influenzae NOT  DETECTED NOT DETECTED Final   Neisseria meningitidis NOT DETECTED NOT DETECTED Final   Pseudomonas aeruginosa NOT DETECTED NOT DETECTED Final   Candida albicans NOT DETECTED NOT DETECTED Final   Candida glabrata NOT DETECTED NOT DETECTED Final   Candida krusei NOT DETECTED NOT DETECTED Final   Candida parapsilosis NOT DETECTED NOT DETECTED Final   Candida tropicalis NOT DETECTED NOT DETECTED Final  Blood culture (routine x 2)     Status: None (Preliminary result)   Collection Time: 09/04/15  9:00 AM  Result Value Ref Range Status   Specimen Description BLOOD RIGHT ANTECUBITAL  Final   Special Requests BOTTLES DRAWN AEROBIC AND ANAEROBIC  10CC  Final   Culture NO GROWTH 3 DAYS  Final   Report Status PENDING  Incomplete  Culture, blood (routine x 2)     Status: None (Preliminary result)   Collection Time: 09/04/15  3:55 PM  Result Value Ref Range Status   Specimen Description BLOOD RIGHT ANTECUBITAL  Final   Special Requests BOTTLES DRAWN AEROBIC AND ANAEROBIC 5CC  Final   Culture NO GROWTH 3 DAYS  Final   Report Status PENDING  Incomplete  Culture, blood (routine x 2)     Status: None (Preliminary result)   Collection Time: 09/04/15  4:02 PM  Result Value Ref Range Status   Specimen Description BLOOD RIGHT HAND  Final   Special Requests BOTTLES DRAWN AEROBIC AND ANAEROBIC 5CC  Final   Culture NO GROWTH 3 DAYS  Final   Report Status PENDING  Incomplete    Scharlene Gloss, [facility] [email or web address] [phone] pager  [phone] cell 09/07/2015, 5:40 PM

## 2015-09-07 NOTE — Progress Notes (Signed)
Text paged primary team to notify re: Lexiscan Myoview results.

## 2015-09-07 NOTE — Care Management Important Message (Signed)
  Important Message  Patient Details  Name: Evan [name] MRN: [id number] Date of Birth: 04/21/1927   Medicare Important Message Given:  Yes    Carles Collet, RN 09/07/2015, 11:11 AMImportant Message  Patient Details  Name: Evan [name] MRN: [id number] Date of Birth: September 16, 1927   Medicare Important Message Given:  Yes    Carles Collet, RN 09/07/2015, 11:11 AM

## 2015-09-08 DIAGNOSIS — I959 Hypotension, unspecified: Secondary | ICD-10-CM | POA: Insufficient documentation

## 2015-09-08 DIAGNOSIS — I10 Essential (primary) hypertension: Secondary | ICD-10-CM

## 2015-09-08 LAB — URINE CULTURE: Culture: NO GROWTH

## 2015-09-08 LAB — CBC
HCT: 35.7 % — ABNORMAL LOW (ref 39.0–52.0)
Hemoglobin: 11.5 g/dL — ABNORMAL LOW (ref 13.0–17.0)
MCH: 30.8 pg (ref 26.0–34.0)
MCHC: 32.2 g/dL (ref 30.0–36.0)
MCV: 95.7 fL (ref 78.0–100.0)
Platelets: 91 10*3/uL — ABNORMAL LOW (ref 150–400)
RBC: 3.73 MIL/uL — ABNORMAL LOW (ref 4.22–5.81)
RDW: 13.1 % (ref 11.5–15.5)
WBC: 5.2 10*3/uL (ref 4.0–10.5)

## 2015-09-08 LAB — BASIC METABOLIC PANEL
Anion gap: 5 (ref 5–15)
BUN: 13 mg/dL (ref 6–20)
CO2: 23 mmol/L (ref 22–32)
Calcium: 8.5 mg/dL — ABNORMAL LOW (ref 8.9–10.3)
Chloride: 110 mmol/L (ref 101–111)
Creatinine, Ser: 1.01 mg/dL (ref 0.61–1.24)
GFR calc Af Amer: >60 mL/min (ref >60)
GFR calc non Af Amer: >60 mL/min (ref >60)
Glucose, Bld: 112 mg/dL — ABNORMAL HIGH (ref 65–99)
Potassium: 4.2 mmol/L (ref 3.5–5.1)
Sodium: 138 mmol/L (ref 135–145)

## 2015-09-08 MED ORDER — MORPHINE SULFATE (PF) 2 MG/ML IV SOLN: 0.5000 mg | INTRAVENOUS | Status: DC | PRN

## 2015-09-08 MED ORDER — ATENOLOL 50 MG PO TABS
50.0000 mg | ORAL_TABLET | Freq: Every day | ORAL | Status: DC
  Administered 2015-09-09 – 2015-09-10 (×2): 50 mg via ORAL
  Filled 2015-09-08 (×2): qty 1

## 2015-09-08 MED ORDER — AMOXICILLIN-POT CLAVULANATE 875-125 MG PO TABS
1.0000 | ORAL_TABLET | Freq: Two times a day (BID) | ORAL | Status: DC
Start: 2015-09-08 — End: 2015-09-10
  Administered 2015-09-08 – 2015-09-10 (×5): 1 via ORAL
  Filled 2015-09-08 (×5): qty 1

## 2015-09-08 NOTE — Progress Notes (Signed)
Physical Therapy Treatment Patient Details Name: Evan [name] MRN: [id number] DOB: 1927-12-16 Today's Date: 09/08/2015    History of Present Illness HPI: Evan [name] is a 82 y.o. male presenting with 3 day history of generalized weakness, increasing difficulty walking resulting in a fall yesterday, but without LOC or injury; He developed abnormal skin rashes in left arm and left axilla for at least 3-4 few weeks initially treated with topical creams but progressing to raw, beefy appearance of the area;  in the ED, chest x-ray shows an infiltrate in the left lower lobe;  has a past medical history of CAD (coronary artery disease); ; Hyperlipidemia; Hemochromatosis; Myocardial infarction (Evan [name]) (1999); Pneumonia; Hepatitis; Headache; Arthritis; DJD (degenerative joint disease); Rheumatoid arthritis (Evan [name]); Osteoarthritis; Chronic lower back pain; and Depression.    PT Comments    Patient is progressing toward mobility goals. Led through therex and encouraged pt to work on them a few times a day. Continue to progress as tolerated with anticipated d/c home with HHPT.    Follow Up Recommendations  Home health PT     Equipment Recommendations  None recommended by PT    Recommendations for Other Services       Precautions / Restrictions Precautions Precautions: Fall Restrictions Weight Bearing Restrictions: No    Mobility  Bed Mobility Overal bed mobility: Modified Independent             General bed mobility comments: increased time  Transfers Overall transfer level: Needs assistance Equipment used: None Transfers: Sit to/from Stand Sit to Stand: Supervision         General transfer comment: supervision for safety;  no physical assist needed  Ambulation/Gait Ambulation/Gait assistance: Min guard Ambulation Distance (Feet): 110 Feet Assistive device: None Gait Pattern/deviations: Step-through pattern;Decreased stride length Gait velocity: decreased   General Gait  Details: very gaurded movements; pt wanted to be close to hand rail "just in case" he needed it but he did not reach for it; cues for cadence and heel strike; tolerated vertical/horizontal head turns, changes in gait velocity, and sudden stops with no LOB   Stairs            Wheelchair Mobility    Modified Rankin (Stroke Patients Only)       Balance Overall balance assessment: Needs assistance Sitting-balance support: Feet supported;No upper extremity supported Sitting balance-[name] Scale: Good     Standing balance support: No upper extremity supported Standing balance-[name] Scale: Fair                      Cognition Arousal/Alertness: Awake/alert Behavior During Therapy: Anxious Overall Cognitive Status: Within Functional Limits for tasks assessed                      Exercises General Exercises - Lower Extremity Long Arc Quad: Strengthening;Both;20 reps;Seated Heel Slides:  (demonstrated) Hip ABduction/ADduction:  (demonstrated) Hip Flexion/Marching: Strengthening;Both;20 reps;Seated    General Comments        Pertinent Vitals/Pain Pain Assessment: No/denies pain    Home Living                      Prior Function            PT Goals (current goals can now be found in the care plan section) Acute Rehab PT Goals Patient Stated Goal: get stronger and go home Progress towards PT goals: Progressing toward goals    Frequency  Min 3X/week  PT Plan Current plan remains appropriate    Co-evaluation             End of Session Equipment Utilized During Treatment: Gait belt Activity Tolerance: Patient tolerated treatment well Patient left: in bed;with call bell/phone within reach;with family/visitor present     Time: 1100-1125 PT Time Calculation (min) (ACUTE ONLY): 25 min  Charges:  $Gait Training: 8-22 mins $Therapeutic Exercise: 8-22 mins                    G Codes:      Evan [name],  PTA Pager: [phone]   09/08/2015, 12:00 PM

## 2015-09-08 NOTE — Progress Notes (Signed)
SUBJECTIVE:  Chest pain better  OBJECTIVE:   Vitals:   Filed Vitals:   09/07/15 1439 09/07/15 2121 09/08/15 0525 09/08/15 0836  BP: 131/75 114/66 133/66 178/87  Pulse: 94 62 62 69  Temp: 99.4 F (37.4 C) 98.3 F (36.8 C) 99.3 F (37.4 C)   TempSrc: Oral Oral Oral   Resp: '22 18 18   '$ Height:      Weight:      SpO2: 95% 96% 95%    I&O's:   Intake/Output Summary (Last 24 hours) at 09/08/15 0949 Last data filed at 09/08/15 0647  Gross per 24 hour  Intake    300 ml  Output    100 ml  Net    200 ml   TELEMETRY: Reviewed telemetry pt in NSR:     PHYSICAL EXAM General: Well developed, well nourished, in no acute distress Head:   Normal cephalic and atramatic  Lungs:   Clear bilaterally to auscultation. Heart:   HRRR S1 S2  No JVD.   Abdomen: abdomen soft and non-tender Msk:  Back normal,  Normal strength and tone for age. Extremities:   No edema.   Neuro: Alert and oriented. Psych:  Normal affect, responds appropriately Skin: No rash, peeling skin on the left arm   LABS: Basic Metabolic Panel:  Recent Labs  09/06/15 0548  NA 137  K 4.1  CL 110  CO2 20*  GLUCOSE 86  BUN 13  CREATININE 1.04  CALCIUM 8.2*   Liver Function Tests:  Recent Labs  09/06/15 0548  AST 15  ALT 10*  ALKPHOS 55  BILITOT 1.2  PROT 4.9*  ALBUMIN 2.4*   No results for input(s): LIPASE, AMYLASE in the last 72 hours. CBC:  Recent Labs  09/06/15 0548  WBC 6.8  HGB 12.0*  HCT 37.5*  MCV 93.3  PLT 95*   Cardiac Enzymes:  Recent Labs  09/05/15 1814 09/05/15 2351 09/06/15 0548  TROPONINI 0.04* 0.03 0.03   BNP: Invalid input(s): POCBNP D-Dimer: No results for input(s): DDIMER in the last 72 hours. Hemoglobin A1C: No results for input(s): HGBA1C in the last 72 hours. Fasting Lipid Panel: No results for input(s): CHOL, HDL, LDLCALC, TRIG, CHOLHDL, LDLDIRECT in the last 72 hours. Thyroid Function Tests: No results for input(s): TSH, T4TOTAL, T3FREE, THYROIDAB in  the last 72 hours.  Invalid input(s): FREET3 Anemia Panel: No results for input(s): VITAMINB12, FOLATE, FERRITIN, TIBC, IRON, RETICCTPCT in the last 72 hours. Coag Panel:   Lab Results  Component Value Date   INR 1.17 09/05/2015    RADIOLOGY: X-ray Chest Pa And Lateral  09/05/2015  CLINICAL DATA:  Community-acquired pneumonia. Left upper chest pain. EXAM: CHEST  2 VIEW COMPARISON:  09/04/2015 FINDINGS: Prior CABG. No confluent airspace opacities. Heart is borderline in size. No effusions or acute bony abnormality. IMPRESSION: No active cardiopulmonary disease. Electronically Signed   By: Rolm Baptise M.D.   On: 09/05/2015 10:15   Ct Head Wo Contrast  09/04/2015  CLINICAL DATA:  Worsening weakness beginning 3 days ago. Left-sided rash. EXAM: CT HEAD WITHOUT CONTRAST TECHNIQUE: Contiguous axial images were obtained from the base of the skull through the vertex without intravenous contrast. COMPARISON:  01/26/2014 FINDINGS: The brain shows mild age related atrophy. There chronic changes of small vessel disease within the deep white matter. There is an old thalamic infarction on the left. No sign of acute infarction, mass lesion, hemorrhage, hydrocephalus or extra-axial collection. Previous mastoidectomy on the right. Chronic mucoperiosteal inflammatory changes of  the left maxillary sinus. There is atherosclerotic calcification of the major vessels at the base of the brain. IMPRESSION: No acute finding by CT. 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 Electronically Signed   By: Nelson Chimes M.D.   On: 09/04/2015 09:55   Nm Myocar Multi W/spect W/wall Motion / Ef  09/07/2015  CLINICAL DATA:  Chest pain EXAM: MYOCARDIAL IMAGING WITH SPECT (REST AND PHARMACOLOGIC-STRESS) GATED LEFT VENTRICULAR WALL MOTION STUDY LEFT VENTRICULAR EJECTION FRACTION TECHNIQUE: Standard myocardial SPECT imaging was performed after resting intravenous injection of 10 mCi Tc-78m tetrofosmin. Subsequently, intravenous infusion of Lexiscan was performed under the supervision of the Cardiology staff. At peak effect of the drug, 30 mCi Tc-970metrofosmin was injected intravenously and standard myocardial SPECT imaging was performed. Quantitative gated imaging was also performed to evaluate left ventricular wall motion, and estimate left ventricular ejection fraction. COMPARISON:  None. FINDINGS: Perfusion: No decreased activity in the left ventricle on stress imaging to suggest reversible ischemia or infarction. Wall Motion: Normal left ventricular wall motion. No left ventricular dilation. Left Ventricular Ejection Fraction: 57 % End diastolic volume 57 ml End systolic volume 25 ml IMPRESSION: 1. No reversible ischemia or infarction. 2. Normal left ventricular wall motion. 3. Left ventricular ejection fraction 57% 4. Non invasive risk stratification*: Low risk *2012 Appropriate Use Criteria for Coronary Revascularization Focused Update: J Am Coll Cardiol. 208315;17(6):160-737http://[email or web address] Electronically Signed   By: KeRolm Baptise.D.   On: 09/07/2015 14:20   Dg Chest Portable 1 View  09/04/2015  CLINICAL DATA:  Altered mental status EXAM: PORTABLE CHEST 1 VIEW COMPARISON:  01/26/2014 FINDINGS: Patient is status post median sternotomy. Mild to moderate cardiac enlargement. Vascular pattern normal. Lungs clear. No pleural effusion. IMPRESSION: Cardiac enlargement similar to prior study. No acute findings otherwise. Electronically Signed   By: RaSkipper Cliche.D.   On: 09/04/2015 11:25      ASSESSMENT: / PLAN:   1) Chest pain: Negative stress test.  Normal LV function and adequate valvular function by echo.  No further cardiac w/u.  Will sign off.   JaJettie BoozeMD  09/08/2015  9:49 AM

## 2015-09-08 NOTE — Progress Notes (Signed)
PROGRESS NOTE    PARRY PO  KMM:[id number] DOB: 1927/06/13 DOA: 09/04/2015 PCP: Odette Fraction, MD   Outpatient Specialists:     Brief Narrative:  Evan [name] is a 82 y.o. male presenting with 3 day history of generalized weakness, increasing difficulty walking resulting in a fall yesterday, but without LOC or injury. Mild is reported by his daughter,as patient was not taking enough liquid POs. He developed abnormal skin rashes in left arm and left axilla for at least 3-4 few weeks initially treated with topical creams but progressing to raw, beefy appearance of the area. He had been started on Keflex yesterday, which suspicious for cellulitis. Of note, he had had a skin biopsy of the area by a dermatologist last week which showed subcorneal pustular dermatitis.    Assessment & Plan:   Active Problems:   Cellulitis   Chest pain   Pain in the chest   Cellulitis withsubcorneal pustular dermatitis on biopsy (blood culture 1/4 coag neg staph) -much improved -not MRSA on blood culture -d/c PICC line D/c vanc -ID consult: stop vanc, augmentin for 3 more days - will need to follow up with dermatology for further management for biopsy results  Cp, somewhat atypical but with obvious risk factors.  Low risk stress test  ? Tinea  lamisil cream topically bid  Bph symptoms Increase flomax 0.'4mg'$  po bid  Hypertension uncontrolled Clonidine prn Increase flomax Cont atenolol  CAD Cont plavix, atenolol   DVT prophylaxis:  SQ Heparin  Code Status: Full Code   Family Communication: Evan at bedside  Disposition Plan:  Home in AM with home health??- await today's labs   Consultants:   ID  cards  Procedures:        Subjective: Feeling better-- arm improved  Objective: Filed Vitals:   09/07/15 2121 09/08/15 0525 09/08/15 0836 09/08/15 1005  BP: 114/66 133/66 178/87 95/54  Pulse: 62 62 69 56  Temp: 98.3 F (36.8 C) 99.3 F (37.4 C)      TempSrc: Oral Oral    Resp: 18 18    Height:      Weight:      SpO2: 96% 95%      Intake/Output Summary (Last 24 hours) at 09/08/15 1237 Last data filed at 09/08/15 0647  Gross per 24 hour  Intake    300 ml  Output    100 ml  Net    200 ml   Filed Weights   09/04/15 1840  Weight: 81.647 kg (180 lb)    Examination:  Awake Alert, Oriented X 3, No new F.N deficits, Normal affect  Symmetrical Chest wall movement, Good air movement bilaterally, CTAB RRR,No Gallops,Rubs or new Murmurs, No Parasternal Heave +ve B.Sounds, Abd Soft, No tenderness, No organomegaly appriciated, No rebound - guarding or rigidity. Peeling rash from forearms to axilla    Data Reviewed: I have personally reviewed following labs and imaging studies  CBC:  Recent Labs Lab 09/04/15 0845 09/05/15 0504 09/06/15 0548  WBC 10.9* 8.0 6.8  NEUTROABS 7.9*  --   --   HGB 14.5 12.7* 12.0*  HCT 45.7 39.8 37.5*  MCV 95.8 94.3 93.3  PLT 143* 104* 95*   Basic Metabolic Panel:  Recent Labs Lab 09/04/15 0845 09/05/15 0504 09/06/15 0548  NA 137 135 137  K 4.6 4.6 4.1  CL 105 105 110  CO2 24 23 20*  GLUCOSE 143* 96 86  BUN '19 14 13  '$ CREATININE 1.42* 1.12 1.04  CALCIUM 8.7* 8.3*  8.2*   GFR: Estimated Creatinine Clearance: 48.2 mL/min (by C-G formula based on Cr of 1.04). Liver Function Tests:  Recent Labs Lab 09/04/15 0845 09/05/15 0504 09/06/15 0548  AST '23 16 15  '$ ALT 14* 12* 10*  ALKPHOS 69 53 55  BILITOT 1.6* 1.7* 1.2  PROT 6.1* 5.1* 4.9*  ALBUMIN 3.3* 2.5* 2.4*   No results for input(s): LIPASE, AMYLASE in the last 168 hours. No results for input(s): AMMONIA in the last 168 hours. Coagulation Profile:  Recent Labs Lab 09/05/15 0504  INR 1.17   Cardiac Enzymes:  Recent Labs Lab 09/05/15 1814 09/05/15 2351 09/06/15 0548  TROPONINI 0.04* 0.03 0.03   BNP (last 3 results) No results for input(s): PROBNP in the last 8760 hours. HbA1C: No results for input(s): HGBA1C in  the last 72 hours. CBG: No results for input(s): GLUCAP in the last 168 hours. Lipid Profile: No results for input(s): CHOL, HDL, LDLCALC, TRIG, CHOLHDL, LDLDIRECT in the last 72 hours. Thyroid Function Tests: No results for input(s): TSH, T4TOTAL, FREET4, T3FREE, THYROIDAB in the last 72 hours. Anemia Panel: No results for input(s): VITAMINB12, FOLATE, FERRITIN, TIBC, IRON, RETICCTPCT in the last 72 hours. Urine analysis:    Component Value Date/Time   COLORURINE YELLOW 07/13/2014 1804   APPEARANCEUR CLEAR 07/13/2014 1804   LABSPEC 1.011 07/13/2014 1804   PHURINE 5.5 07/13/2014 1804   GLUCOSEU NEGATIVE 07/13/2014 1804   HGBUR NEGATIVE 07/13/2014 1804   BILIRUBINUR NEGATIVE 07/13/2014 1804   KETONESUR NEGATIVE 07/13/2014 1804   PROTEINUR NEGATIVE 07/13/2014 1804   UROBILINOGEN 0.2 07/13/2014 1804   NITRITE NEGATIVE 07/13/2014 1804   LEUKOCYTESUR NEGATIVE 07/13/2014 1804     ) Recent Results (from the past 240 hour(s))  Blood culture (routine x 2)     Status: Abnormal   Collection Time: 09/04/15  8:55 AM  Result Value Ref Range Status   Specimen Description BLOOD RIGHT ARM  Final   Special Requests BOTTLES DRAWN AEROBIC AND ANAEROBIC  5CC  Final   Culture  Setup Time   Final    GRAM POSITIVE COCCI IN CLUSTERS AEROBIC BOTTLE ONLY CRITICAL RESULT CALLED TO, READ BACK BY AND VERIFIED WITH: MYER,J PHARMD 09/05/15 1707 [place]    Culture (A)  Final    STAPHYLOCOCCUS SPECIES (COAGULASE NEGATIVE) THE SIGNIFICANCE OF ISOLATING THIS ORGANISM FROM A SINGLE SET OF BLOOD CULTURES WHEN MULTIPLE SETS ARE DRAWN IS UNCERTAIN. PLEASE NOTIFY THE MICROBIOLOGY DEPARTMENT WITHIN ONE WEEK IF SPECIATION AND SENSITIVITIES ARE REQUIRED.    Report Status 09/07/2015 FINAL  Final  Blood Culture ID Panel (Reflexed)     Status: Abnormal   Collection Time: 09/04/15  8:55 AM  Result Value Ref Range Status   Enterococcus species NOT DETECTED NOT DETECTED Final   Vancomycin resistance NOT DETECTED NOT  DETECTED Final   Listeria monocytogenes NOT DETECTED NOT DETECTED Final   Staphylococcus species DETECTED (A) NOT DETECTED Final    Comment: CRITICAL RESULT CALLED TO, READ BACK BY AND VERIFIED WITH: MYER,J PHARMD 09/05/15 1707 [place],K    Staphylococcus aureus NOT DETECTED NOT DETECTED Final   Methicillin resistance DETECTED (A) NOT DETECTED Final    Comment: CRITICAL RESULT CALLED TO, READ BACK BY AND VERIFIED WITH: MYER,J PHARMD 09/05/15 1707 WOOTEN,K    Streptococcus species NOT DETECTED NOT DETECTED Final   Streptococcus agalactiae NOT DETECTED NOT DETECTED Final   Streptococcus pneumoniae NOT DETECTED NOT DETECTED Final   Streptococcus pyogenes NOT DETECTED NOT DETECTED Final   Acinetobacter baumannii NOT DETECTED NOT DETECTED  Final   Enterobacteriaceae species NOT DETECTED NOT DETECTED Final   Enterobacter cloacae complex NOT DETECTED NOT DETECTED Final   Escherichia coli NOT DETECTED NOT DETECTED Final   Klebsiella oxytoca NOT DETECTED NOT DETECTED Final   Klebsiella pneumoniae NOT DETECTED NOT DETECTED Final   Proteus species NOT DETECTED NOT DETECTED Final   Serratia marcescens NOT DETECTED NOT DETECTED Final   Carbapenem resistance NOT DETECTED NOT DETECTED Final   Haemophilus influenzae NOT DETECTED NOT DETECTED Final   Neisseria meningitidis NOT DETECTED NOT DETECTED Final   Pseudomonas aeruginosa NOT DETECTED NOT DETECTED Final   Candida albicans NOT DETECTED NOT DETECTED Final   Candida glabrata NOT DETECTED NOT DETECTED Final   Candida krusei NOT DETECTED NOT DETECTED Final   Candida parapsilosis NOT DETECTED NOT DETECTED Final   Candida tropicalis NOT DETECTED NOT DETECTED Final  Blood culture (routine x 2)     Status: None (Preliminary result)   Collection Time: 09/04/15  9:00 AM  Result Value Ref Range Status   Specimen Description BLOOD RIGHT ANTECUBITAL  Final   Special Requests BOTTLES DRAWN AEROBIC AND ANAEROBIC  10CC  Final   Culture NO GROWTH 3 DAYS  Final    Report Status PENDING  Incomplete  Culture, blood (routine x 2)     Status: None (Preliminary result)   Collection Time: 09/04/15  3:55 PM  Result Value Ref Range Status   Specimen Description BLOOD RIGHT ANTECUBITAL  Final   Special Requests BOTTLES DRAWN AEROBIC AND ANAEROBIC 5CC  Final   Culture NO GROWTH 3 DAYS  Final   Report Status PENDING  Incomplete  Culture, blood (routine x 2)     Status: None (Preliminary result)   Collection Time: 09/04/15  4:02 PM  Result Value Ref Range Status   Specimen Description BLOOD RIGHT HAND  Final   Special Requests BOTTLES DRAWN AEROBIC AND ANAEROBIC 5CC  Final   Culture NO GROWTH 3 DAYS  Final   Report Status PENDING  Incomplete      Anti-infectives    Start     Dose/Rate Route Frequency Ordered Stop   09/08/15 1200  amoxicillin-clavulanate (AUGMENTIN) 875-125 MG per tablet 1 tablet     1 tablet Oral Every 12 hours 09/08/15 1145 09/11/15 0959   09/05/15 1700  vancomycin (VANCOCIN) 1,250 mg in sodium chloride 0.9 % 250 mL IVPB  Status:  Discontinued     1,250 mg 166.7 mL/hr over 90 Minutes Intravenous Every 24 hours 09/04/15 1607 09/08/15 1145   09/04/15 2300  piperacillin-tazobactam (ZOSYN) IVPB 3.375 g  Status:  Discontinued     3.375 g 12.5 mL/hr over 240 Minutes Intravenous Every 8 hours 09/04/15 1607 09/05/15 1713   09/04/15 1600  vancomycin (VANCOCIN) 1,500 mg in sodium chloride 0.9 % 500 mL IVPB     1,500 mg 250 mL/hr over 120 Minutes Intravenous  Once 09/04/15 1527 09/04/15 2315   09/04/15 1600  piperacillin-tazobactam (ZOSYN) IVPB 3.375 g     3.375 g 100 mL/hr over 30 Minutes Intravenous  Once 09/04/15 1527 09/04/15 1722   09/04/15 1115  azithromycin (ZITHROMAX) 500 mg in dextrose 5 % 250 mL IVPB     500 mg 250 mL/hr over 60 Minutes Intravenous  Once 09/04/15 1110 09/04/15 1224   09/04/15 0900  cefTRIAXone (ROCEPHIN) 1 g in dextrose 5 % 50 mL IVPB     1 g 100 mL/hr over 30 Minutes Intravenous  Once 09/04/15 0850 09/04/15 1008  Radiology Studies: Nm Myocar Multi W/spect W/wall Motion / Ef  09/07/2015  CLINICAL DATA:  Chest pain EXAM: MYOCARDIAL IMAGING WITH SPECT (REST AND PHARMACOLOGIC-STRESS) GATED LEFT VENTRICULAR WALL MOTION STUDY LEFT VENTRICULAR EJECTION FRACTION TECHNIQUE: Standard myocardial SPECT imaging was performed after resting intravenous injection of 10 mCi Tc-48mtetrofosmin. Subsequently, intravenous infusion of Lexiscan was performed under the supervision of the Cardiology staff. At peak effect of the drug, 30 mCi Tc-948metrofosmin was injected intravenously and standard myocardial SPECT imaging was performed. Quantitative gated imaging was also performed to evaluate left ventricular wall motion, and estimate left ventricular ejection fraction. COMPARISON:  None. FINDINGS: Perfusion: No decreased activity in the left ventricle on stress imaging to suggest reversible ischemia or infarction. Wall Motion: Normal left ventricular wall motion. No left ventricular dilation. Left Ventricular Ejection Fraction: 57 % End diastolic volume 57 ml End systolic volume 25 ml IMPRESSION: 1. No reversible ischemia or infarction. 2. Normal left ventricular wall motion. 3. Left ventricular ejection fraction 57% 4. Non invasive risk stratification*: Low risk *2012 Appropriate Use Criteria for Coronary Revascularization Focused Update: J Am Coll Cardiol. 206226;33(3):545-625http://content.[email or web address]?[email or web address] Electronically Signed   By: KeRolm Baptise.D.   On: 09/07/2015 14:20        Scheduled Meds: . amoxicillin-clavulanate  1 tablet Oral Q12H  . [START ON 09/09/2015] atenolol  50 mg Oral Daily  . clopidogrel  75 mg Oral Daily  . FLUoxetine  20 mg Oral Daily  . Lifitegrast  1 drop Ophthalmic BID  . pantoprazole  40 mg Oral BID  . saccharomyces boulardii  250 mg Oral BID  . tamsulosin  0.4 mg Oral BID AC  . terbinafine   Topical BID   Continuous Infusions:    LOS: 4 days    Time spent:  35 min    JEBrittDO [facility] Pager [phone] 7PM-7AM, please contact night-coverage [email or web address] Password [facility]/09/2015, 12:37 PM

## 2015-09-09 LAB — CULTURE, BLOOD (ROUTINE X 2)
Culture: NO GROWTH
Culture: NO GROWTH
Culture: NO GROWTH

## 2015-09-09 NOTE — Progress Notes (Signed)
PROGRESS NOTE    Evan [name]  UUV:[id number] DOB: 1928-01-21 DOA: 09/04/2015 PCP: Odette Fraction, MD (Confirm with patient/family/NH records and if not entered, this HAS to be entered at [facility] point of entry. "No PCP" if truly none.)   Brief Narrative: (Start on day 1 of progress note - keep it brief and live) Left upper extremity cellulitis. 82 y/o male with sub corneal pustular dermatitis, responded well to IV antibiotics.   Assessment & Plan:   Active Problems:   Cellulitis   Chest pain   Pain in the chest   Essential hypertension   1. Cardiovascular. Patient euvolemic, will continue atenolol for blood pressure control.  2. Pulmonary. Will continue oxymetry monitor, no signs of volume overload.  3. Nephrology. Cr at 1,01 with K at 4,2 and Na at 138,. Patient tolerating po well.  4. Skin. Cellulitis, clinically worse, yesterday had vancomycin held, no significant worsening of rash, will keep current antibiotic therapy and will follow on ID recommendations, follow am cell count and temp curve over next 24 hours. May need MRSA coverage.   5. dvt [x  Patient is at moderate risk for worsening cellulitis.     DVT prophylaxis: (Lovenox/Heparin/SCD's/anticoagulated/None (if comfort care) Code Status: (Full/Partial - specify details) Family Communication: (Specify name, relationship & date discussed. NO "discussed with patient") Disposition Plan: (specify when and where you expect patient to be discharged). Include barriers to DC in this tab.   Consultants:   I.D  Procedures: (Don't include imaging studies which can be auto populated. Include things that cannot be auto populated i.e. Echo, Carotid and venous dopplers, Foley, Bipap, HD, tubes/drains, wound vac, central lines etc)    Antimicrobials: (specify start and planned stop date. Auto populated tables are space occupying and do not give end dates)     Subjective: Patient has noted recurrence of his rash on left  upper extremity, associated with pruritus. No fever or chills, no nausea or vomiting. No diarrhea.   Objective: Filed Vitals:   09/08/15 0836 09/08/15 1005 09/08/15 2133 09/09/15 0455  BP: 178/87 95/54 147/56 163/72  Pulse: 69 56 51 65  Temp:   98.4 F (36.9 C) 97.6 F (36.4 C)  TempSrc:      Resp:   18 18  Height:      Weight:      SpO2:   94% 93%    Intake/Output Summary (Last 24 hours) at 09/09/15 1038 Last data filed at 09/09/15 1012  Gross per 24 hour  Intake    720 ml  Output      0 ml  Net    720 ml   Filed Weights   09/04/15 1840  Weight: 81.647 kg (180 lb)    Examination:  General exam: Appears calm and comfortable. Not in pain. E- ENT- oral mucosa moist, conjunctivae with no pallor or icterus. Respiratory system: Clear to auscultation, bilateral vesicular breath sounds. Respiratory effort normal. Cardiovascular system: S1 & S2 heard, RRR. No JVD, murmurs, rubs, gallops or clicks. No pedal edema. Gastrointestinal system: Abdomen is nondistended, soft and nontender. No organomegaly or masses felt. Normal bowel sounds heard. Central nervous system: Alert and oriented. No focal neurological deficits. Extremities: Symmetric 5 x 5 power. Skin: left upper ext. With erythema on at the flexor aspect of the elbow joint, with scaling, expanding into axilla and mid axillar line. Ill defined margins, no hives or pustules. Psychiatry: Judgement and insight appear normal. Mood & affect appropriate.     Data Reviewed: I  have personally reviewed following labs and imaging studies  CBC:  Recent Labs Lab 09/04/15 0845 09/05/15 0504 09/06/15 0548 09/08/15 1211  WBC 10.9* 8.0 6.8 5.2  NEUTROABS 7.9*  --   --   --   HGB 14.5 12.7* 12.0* 11.5*  HCT 45.7 39.8 37.5* 35.7*  MCV 95.8 94.3 93.3 95.7  PLT 143* 104* 95* 91*   Basic Metabolic Panel:  Recent Labs Lab 09/04/15 0845 09/05/15 0504 09/06/15 0548 09/08/15 1211  NA 137 135 137 138  K 4.6 4.6 4.1 4.2  CL 105  105 110 110  CO2 24 23 20* 23  GLUCOSE 143* 96 86 112*  BUN '19 14 13 13  '$ CREATININE 1.42* 1.12 1.04 1.01  CALCIUM 8.7* 8.3* 8.2* 8.5*   GFR: Estimated Creatinine Clearance: 49.6 mL/min (by C-G formula based on Cr of 1.01). Liver Function Tests:  Recent Labs Lab 09/04/15 0845 09/05/15 0504 09/06/15 0548  AST '23 16 15  '$ ALT 14* 12* 10*  ALKPHOS 69 53 55  BILITOT 1.6* 1.7* 1.2  PROT 6.1* 5.1* 4.9*  ALBUMIN 3.3* 2.5* 2.4*   No results for input(s): LIPASE, AMYLASE in the last 168 hours. No results for input(s): AMMONIA in the last 168 hours. Coagulation Profile:  Recent Labs Lab 09/05/15 0504  INR 1.17   Cardiac Enzymes:  Recent Labs Lab 09/05/15 1814 09/05/15 2351 09/06/15 0548  TROPONINI 0.04* 0.03 0.03   BNP (last 3 results) No results for input(s): PROBNP in the last 8760 hours. HbA1C: No results for input(s): HGBA1C in the last 72 hours. CBG: No results for input(s): GLUCAP in the last 168 hours. Lipid Profile: No results for input(s): CHOL, HDL, LDLCALC, TRIG, CHOLHDL, LDLDIRECT in the last 72 hours. Thyroid Function Tests: No results for input(s): TSH, T4TOTAL, FREET4, T3FREE, THYROIDAB in the last 72 hours. Anemia Panel: No results for input(s): VITAMINB12, FOLATE, FERRITIN, TIBC, IRON, RETICCTPCT in the last 72 hours. Sepsis Labs:  Recent Labs Lab 09/04/15 0911 09/04/15 1320  LATICACIDVEN 3.12* 0.80    Recent Results (from the past 240 hour(s))  Blood culture (routine x 2)     Status: Abnormal   Collection Time: 09/04/15  8:55 AM  Result Value Ref Range Status   Specimen Description BLOOD RIGHT ARM  Final   Special Requests BOTTLES DRAWN AEROBIC AND ANAEROBIC  5CC  Final   Culture  Setup Time   Final    GRAM POSITIVE COCCI IN CLUSTERS AEROBIC BOTTLE ONLY CRITICAL RESULT CALLED TO, READ BACK BY AND VERIFIED WITH: MYER,J PHARMD 09/05/15 1707 [place]    Culture (A)  Final    STAPHYLOCOCCUS SPECIES (COAGULASE NEGATIVE) THE SIGNIFICANCE OF  ISOLATING THIS ORGANISM FROM A SINGLE SET OF BLOOD CULTURES WHEN MULTIPLE SETS ARE DRAWN IS UNCERTAIN. PLEASE NOTIFY THE MICROBIOLOGY DEPARTMENT WITHIN ONE WEEK IF SPECIATION AND SENSITIVITIES ARE REQUIRED.    Report Status 09/07/2015 FINAL  Final  Blood Culture ID Panel (Reflexed)     Status: Abnormal   Collection Time: 09/04/15  8:55 AM  Result Value Ref Range Status   Enterococcus species NOT DETECTED NOT DETECTED Final   Vancomycin resistance NOT DETECTED NOT DETECTED Final   Listeria monocytogenes NOT DETECTED NOT DETECTED Final   Staphylococcus species DETECTED (A) NOT DETECTED Final    Comment: CRITICAL RESULT CALLED TO, READ BACK BY AND VERIFIED WITH: MYER,J PHARMD 09/05/15 1707 [place]OT[place],K    Staphylococcus aureus NOT DETECTED NOT DETECTED Final   Methicillin resistance DETECTED (A) NOT DETECTED Final    Comment:  CRITICAL RESULT CALLED TO, READ BACK BY AND VERIFIED WITH: MYER,J PHARMD 09/05/15 1707 WOOTEN,K    Streptococcus species NOT DETECTED NOT DETECTED Final   Streptococcus agalactiae NOT DETECTED NOT DETECTED Final   Streptococcus pneumoniae NOT DETECTED NOT DETECTED Final   Streptococcus pyogenes NOT DETECTED NOT DETECTED Final   Acinetobacter baumannii NOT DETECTED NOT DETECTED Final   Enterobacteriaceae species NOT DETECTED NOT DETECTED Final   Enterobacter cloacae complex NOT DETECTED NOT DETECTED Final   Escherichia coli NOT DETECTED NOT DETECTED Final   Klebsiella oxytoca NOT DETECTED NOT DETECTED Final   Klebsiella pneumoniae NOT DETECTED NOT DETECTED Final   Proteus species NOT DETECTED NOT DETECTED Final   Serratia marcescens NOT DETECTED NOT DETECTED Final   Carbapenem resistance NOT DETECTED NOT DETECTED Final   Haemophilus influenzae NOT DETECTED NOT DETECTED Final   Neisseria meningitidis NOT DETECTED NOT DETECTED Final   Pseudomonas aeruginosa NOT DETECTED NOT DETECTED Final   Candida albicans NOT DETECTED NOT DETECTED Final   Candida glabrata NOT DETECTED NOT  DETECTED Final   Candida krusei NOT DETECTED NOT DETECTED Final   Candida parapsilosis NOT DETECTED NOT DETECTED Final   Candida tropicalis NOT DETECTED NOT DETECTED Final  Blood culture (routine x 2)     Status: None (Preliminary result)   Collection Time: 09/04/15  9:00 AM  Result Value Ref Range Status   Specimen Description BLOOD RIGHT ANTECUBITAL  Final   Special Requests BOTTLES DRAWN AEROBIC AND ANAEROBIC  10CC  Final   Culture NO GROWTH 4 DAYS  Final   Report Status PENDING  Incomplete  Culture, blood (routine x 2)     Status: None (Preliminary result)   Collection Time: 09/04/15  3:55 PM  Result Value Ref Range Status   Specimen Description BLOOD RIGHT ANTECUBITAL  Final   Special Requests BOTTLES DRAWN AEROBIC AND ANAEROBIC 5CC  Final   Culture NO GROWTH 4 DAYS  Final   Report Status PENDING  Incomplete  Culture, blood (routine x 2)     Status: None (Preliminary result)   Collection Time: 09/04/15  4:02 PM  Result Value Ref Range Status   Specimen Description BLOOD RIGHT HAND  Final   Special Requests BOTTLES DRAWN AEROBIC AND ANAEROBIC 5CC  Final   Culture NO GROWTH 4 DAYS  Final   Report Status PENDING  Incomplete  Urine culture     Status: None   Collection Time: 09/07/15  3:45 PM  Result Value Ref Range Status   Specimen Description URINE, CLEAN CATCH  Final   Special Requests NONE  Final   Culture NO GROWTH  Final   Report Status 09/08/2015 FINAL  Final         Radiology Studies: Nm Myocar Multi W/spect W/wall Motion / Ef  09/07/2015  CLINICAL DATA:  Chest pain EXAM: MYOCARDIAL IMAGING WITH SPECT (REST AND PHARMACOLOGIC-STRESS) GATED LEFT VENTRICULAR WALL MOTION STUDY LEFT VENTRICULAR EJECTION FRACTION TECHNIQUE: Standard myocardial SPECT imaging was performed after resting intravenous injection of 10 mCi Tc-76mtetrofosmin. Subsequently, intravenous infusion of Lexiscan was performed under the supervision of the Cardiology staff. At peak effect of the drug, 30  mCi Tc-931metrofosmin was injected intravenously and standard myocardial SPECT imaging was performed. Quantitative gated imaging was also performed to evaluate left ventricular wall motion, and estimate left ventricular ejection fraction. COMPARISON:  None. FINDINGS: Perfusion: No decreased activity in the left ventricle on stress imaging to suggest reversible ischemia or infarction. Wall Motion: Normal left ventricular wall motion. No left  ventricular dilation. Left Ventricular Ejection Fraction: 57 % End diastolic volume 57 ml End systolic volume 25 ml IMPRESSION: 1. No reversible ischemia or infarction. 2. Normal left ventricular wall motion. 3. Left ventricular ejection fraction 57% 4. Non invasive risk stratification*: Low risk *2012 Appropriate Use Criteria for Coronary Revascularization Focused Update: J Am Coll Cardiol. 5072;25(7):505-183. http://[email or web address]=[email or web address] Electronically Signed   By: Rolm Baptise M.D.   On: 09/07/2015 14:20        Scheduled Meds: . amoxicillin-clavulanate  1 tablet Oral Q12H  . atenolol  50 mg Oral Daily  . clopidogrel  75 mg Oral Daily  . FLUoxetine  20 mg Oral Daily  . Lifitegrast  1 drop Ophthalmic BID  . pantoprazole  40 mg Oral BID  . saccharomyces boulardii  250 mg Oral BID  . tamsulosin  0.4 mg Oral BID AC  . terbinafine   Topical BID   Continuous Infusions:    LOS: 5 days       Ophelia Sipe Gerome Apley, MD [facility] Pager [phone]  If 7PM-7AM, please contact night-coverage [email or web address] Password [facility] 09/09/2015, 10:38 AM

## 2015-09-10 DIAGNOSIS — L03114 Cellulitis of left upper limb: Secondary | ICD-10-CM

## 2015-09-10 LAB — CBC WITH DIFFERENTIAL/PLATELET
Basophils Absolute: 0 10*3/uL (ref 0.0–0.1)
Basophils Relative: 0 %
Eosinophils Absolute: 0.1 10*3/uL (ref 0.0–0.7)
Eosinophils Relative: 2 %
HCT: 38.4 % — ABNORMAL LOW (ref 39.0–52.0)
Hemoglobin: 12.5 g/dL — ABNORMAL LOW (ref 13.0–17.0)
Lymphocytes Relative: 21 %
Lymphs Abs: 1.1 10*3/uL (ref 0.7–4.0)
MCH: 30 pg (ref 26.0–34.0)
MCHC: 32.6 g/dL (ref 30.0–36.0)
MCV: 92.1 fL (ref 78.0–100.0)
Monocytes Absolute: 0.7 10*3/uL (ref 0.1–1.0)
Monocytes Relative: 14 %
Neutro Abs: 3.3 10*3/uL (ref 1.7–7.7)
Neutrophils Relative %: 63 %
Platelets: 100 10*3/uL — ABNORMAL LOW (ref 150–400)
RBC: 4.17 MIL/uL — ABNORMAL LOW (ref 4.22–5.81)
RDW: 12.7 % (ref 11.5–15.5)
WBC: 5.2 10*3/uL (ref 4.0–10.5)

## 2015-09-10 LAB — BASIC METABOLIC PANEL
Anion gap: 7 (ref 5–15)
BUN: 9 mg/dL (ref 6–20)
CO2: 24 mmol/L (ref 22–32)
Calcium: 8.8 mg/dL — ABNORMAL LOW (ref 8.9–10.3)
Chloride: 105 mmol/L (ref 101–111)
Creatinine, Ser: 0.91 mg/dL (ref 0.61–1.24)
GFR calc Af Amer: >60 mL/min (ref >60)
GFR calc non Af Amer: >60 mL/min (ref >60)
Glucose, Bld: 122 mg/dL — ABNORMAL HIGH (ref 65–99)
Potassium: 4.6 mmol/L (ref 3.5–5.1)
Sodium: 136 mmol/L (ref 135–145)

## 2015-09-10 MED ORDER — SACCHAROMYCES BOULARDII 250 MG PO CAPS: 250.0000 mg | ORAL_CAPSULE | Freq: Two times a day (BID) | ORAL | Status: DC

## 2015-09-10 MED ORDER — AMOXICILLIN-POT CLAVULANATE 875-125 MG PO TABS: 1.0000 | ORAL_TABLET | Freq: Two times a day (BID) | ORAL | Status: DC

## 2015-09-10 MED ORDER — PANTOPRAZOLE SODIUM 40 MG PO TBEC: 40.0000 mg | DELAYED_RELEASE_TABLET | Freq: Two times a day (BID) | ORAL | Status: DC

## 2015-09-10 NOTE — Discharge Summary (Signed)
Evan [name], is a 82 y.o. male  DOB [birth year]  MRN [id number].  Admission date:  09/04/2015  Admitting Physician  Mir Marry Guan, MD  Discharge Date:  09/10/2015   Primary MD  Odette Fraction, MD  Recommendations for primary care physician for things to follow:   Patient is discharged home with instructions to follow with Dermatology and Primary Care, will complete 6 more days of antibiotic therapy. Will continue topical and systemic steroids as per prior to admission orders.   Admission Diagnosis  Metabolic encephalopathy [Y81.44] CAP (community acquired pneumonia) [J18.9] Cellulitis of left axilla [L03.112]   Discharge Diagnosis  Metabolic encephalopathy [Y18.56] CAP (community acquired pneumonia) [J18.9] Cellulitis of left axilla [L03.112]   CAP ruled out on this admission Pustular dermatitis  Active Problems:   Cellulitis   Chest pain   Pain in the chest   Essential hypertension      Past Medical History  Diagnosis Date  . CAD (coronary artery disease)   . BPH (benign prostatic hyperplasia)   . GERD (gastroesophageal reflux disease)   . Hypertension   . Anxiety   . History of ASCVD     MULTIVESSEL  . Anginal chest pain at rest [facility])     Chronic non, controlled on Lorazepam  . Hyperlipidemia   . Hemochromatosis     Possible, elevated iron stores, hook-like osteophytes on hand films, normal LFTs  . Esophageal ulcer without bleeding     bid ppi indefinitely  . Complication of anesthesia     "problems making water afterwards"  . Family history of adverse reaction to anesthesia     "children w/PONV"  . Myocardial infarction ([place]) 1999  . Pneumonia     "several times; got a little now" (09/04/2015)  . Hepatitis     "yellow jaundice as a baby"  . Headache     "couple/week maybe" (09/04/2015)  . Arthritis     "all over"   . DJD (degenerative joint disease)   . Rheumatoid  arthritis ([place])   . Osteoarthritis   . Chronic lower back pain   . Depression     Past Surgical History  Procedure Laterality Date  . Cholecystectomy open    . Shoulder open rotator cuff repair Right   . Total knee arthroplasty Bilateral   . Coronary artery bypass graft  1999  . Descending aortic aneurysm repair w/ stent      DE stent ostium into the right radial free graft at OM-, 02-2007  . Esophagogastroduodenoscopy N/A 11/09/2014    Procedure: ESOPHAGOGASTRODUODENOSCOPY (EGD);  Surgeon: [name], MD;  Location: [facility] ENDOSCOPY;  Service: Endoscopy;  Laterality: N/A;  . Inguinal hernia repair    . Joint replacement    . Nasal sinus surgery    . Arm skin lesion biopsy / excision Left 09/02/2015  . Coronary angioplasty with stent placement    . Cataract extraction w/ intraocular lens  implant, bilateral Bilateral   . Corneal transplant Bilateral     "one at [facility]; one at  [facility]"       HPI  from the history and physical done on the day of admission:   This is a 82 year old gentleman who presents to hospital with a chief complaint of generalized weakness. Apparently his po mouth intake had decreased as a consequence of a skin rash that he developed in his left arm and left axilla, that has been ongoing for last 3-4 weeks and has been followed by dermatology. On initial physical examination his blood pressure was 846 systolic, his respiratory rate was 25, oxygen saturation was 94%, his oral mucosa was moist, his lungs were clear to auscultation bilaterally, heart S1-S2 present and rhythmic, abdomen was soft, nontender, on his left upper extremity he had erythematous rash with eczematous appearing regions, painful to touch with signs of pruritus. The rash was mostly affecting the flexor area of the elbow joint as well as the axillary region and the mid axillary line. His initial white count was 8.0, his creatinine was 1.12 with sodium 135. His chest film had no significant infiltrates. His head  CT had no acute findings.  Patient was admitted to hospital working diagnosis of cellulitis.       Hospital Course:    1. Cardiovascular. Patient responded well to supportive care with IV fluids and IV vancomycin. Posterior IV antibiotics were de-escalate it to oral Augmentin. Patient blood pressure was continued to be controlled with atenolol. Patient was seen by infection disease specialist who recommend continue antibiotic with Augmentin likelihood of MRSA infection was considered to be low.  2. Pulmonary. Pneumonia had been ruled out through the course of his hospital stay. He is oxygen was motorized and he received supplemental oxygen as needed.  3. Nephrology. Patient kidney function was followed through his hospital stay with a stable creatinine and electrolytes.  4.  Dermatology. Patient was diagnosed as an outpatient with pustular dermatitis. At this point suspected to be an over bacterial infection that clinically has been improving. Patient will continue his topical steroids as well as systemic steroids.   Discharge Condition: Stable  Follow UP  Follow-up Information    Follow up with [facility].   Why:  Home health PT arranged   Contact information:   [address] [place] [address] [phone]       Follow up with [facility].   Why:  cane to be delivered to bedside prior to discharge   Contact information:   [address] [place] [address] [phone]        Consults obtained - Infection disease  Diet and Activity recommendation: See Discharge Instructions below  Discharge Instructions    Continue to follow-up with outpatient primary care physician and dermatology. Will continue topical steroids as instructed per outpatient regimen.  Discharge Instructions    Diet - low sodium heart healthy    Complete by:  As directed      Discharge instructions    Complete by:  As directed   Please follow  with dermatologist as scheduled.     Increase activity slowly    Complete by:  As directed              Discharge Medications       Medication List    STOP taking these medications        cephALEXin 500 MG capsule  Commonly known as:  [place] these medications        amoxicillin-clavulanate  875-125 MG tablet  Commonly known as:  AUGMENTIN  Take 1 tablet by mouth every 12 (twelve) hours.     atenolol 50 MG tablet  Commonly known as:  TENORMIN  TAKE 1 TABLET BY MOUTH  EVERY DAY     betamethasone dipropionate 0.05 % ointment  Commonly known as:  DIPROLENE     clopidogrel 75 MG tablet  Commonly known as:  PLAVIX  TAKE 1 TABLET BY MOUTH  EVERY DAY     dapsone 25 MG tablet  Take 50 mg by mouth daily.     diclofenac sodium 1 % Gel  Commonly known as:  VOLTAREN  Apply 2 g topically daily as needed (for pain in knees and feet).     FLUoxetine 20 MG capsule  Commonly known as:  PROZAC  TAKE ONE CAPSULE BY MOUTH DAILY     folic acid 1 MG tablet  Commonly known as:  FOLVITE  Take 1 mg by mouth daily.     hydrocortisone 2.5 % cream  APPLY TOPICALLY TWO TIMES DAILY     hydrOXYzine 25 MG tablet  Commonly known as:  ATARAX/VISTARIL  Take 1 tablet (25 mg total) by mouth every 8 (eight) hours as needed for itching.     leflunomide 10 MG tablet  Commonly known as:  ARAVA  Take 10 mg by mouth daily.     Lifitegrast 5 % Soln  Commonly known as:  XIIDRA  Apply 1 drop to eye 2 (two) times daily.     LORazepam 1 MG tablet  Commonly known as:  ATIVAN  TAKE ONE (1) TABLET BY MOUTH 3 TIMES DAILY. TAKE AT 8AM, 12 NOON, AND4PM.     mometasone 0.1 % ointment  Commonly known as:  ELOCON  APPLY TO THE AFFECTED AREAS DAILY AS DIRECTED     nitroGLYCERIN 0.4 MG SL tablet  Commonly known as:  NITROSTAT  Place 1 tablet (0.4 mg total) under the tongue every 5 (five) minutes as needed for chest pain.     pantoprazole 40 MG tablet  Commonly known as:  PROTONIX  Take  1 tablet (40 mg total) by mouth 2 (two) times daily. TAKE 1 TABLET BY MOUTH EVERY DAY     predniSONE 10 MG tablet  Commonly known as:  DELTASONE     rosuvastatin 5 MG tablet  Commonly known as:  CRESTOR  TAKE 1 TABLET BY MOUTH  EVERY DAY     sodium chloride 0.65 % Soln nasal spray  Commonly known as:  OCEAN  Place 1 spray into both nostrils as needed for congestion.     tamsulosin 0.4 MG Caps capsule  Commonly known as:  FLOMAX  TAKE 1 CAPSULE EVERY DAY     temazepam 30 MG capsule  Commonly known as:  RESTORIL  Take 30 mg by mouth at bedtime as needed for sleep.     traZODone 50 MG tablet  Commonly known as:  DESYREL  TAKE TWO TABLETS BY MOUTH AT BEDTIME AS NEEDED FOR SLEEP        Major procedures and Radiology Reports - PLEASE review detailed and final reports for all details, in brief -      X-ray Chest Pa And Lateral  09/05/2015  CLINICAL DATA:  Community-acquired pneumonia. Left upper chest pain. EXAM: CHEST  2 VIEW COMPARISON:  09/04/2015 FINDINGS: Prior CABG. No confluent airspace opacities. Heart is borderline in size. No effusions or acute bony abnormality. IMPRESSION: No active cardiopulmonary disease. Electronically Signed   By:  Rolm Baptise M.D.   On: 09/05/2015 10:15   Ct Head Wo Contrast  09/04/2015  CLINICAL DATA:  Worsening weakness beginning 3 days ago. Left-sided rash. EXAM: CT HEAD WITHOUT CONTRAST TECHNIQUE: Contiguous axial images were obtained from the base of the skull through the vertex without intravenous contrast. COMPARISON:  01/26/2014 FINDINGS: The brain shows mild age related atrophy. There chronic changes of small vessel disease within the deep white matter. There is an old thalamic infarction on the left. No sign of acute infarction, mass lesion, hemorrhage, hydrocephalus or extra-axial collection. Previous mastoidectomy on the right. Chronic mucoperiosteal inflammatory changes of the left maxillary sinus. There is atherosclerotic calcification of the  major vessels at the base of the brain. IMPRESSION: No acute finding by CT. 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 Electronically Signed   By: Nelson Chimes M.D.   On: 09/04/2015 09:55   Nm Myocar Multi W/spect W/wall Motion / Ef  09/07/2015  CLINICAL DATA:  Chest pain EXAM: MYOCARDIAL IMAGING WITH SPECT (REST AND PHARMACOLOGIC-STRESS) GATED LEFT VENTRICULAR WALL MOTION STUDY LEFT VENTRICULAR EJECTION FRACTION TECHNIQUE: Standard myocardial SPECT imaging was performed after resting intravenous injection of 10 mCi Tc-64mtetrofosmin. Subsequently, intravenous infusion of Lexiscan was performed under the supervision of the Cardiology staff. At peak effect of the drug, 30 mCi Tc-975metrofosmin was injected intravenously and standard myocardial SPECT imaging was performed. Quantitative gated imaging was also performed to evaluate left ventricular wall motion, and estimate left ventricular ejection fraction. COMPARISON:  None. FINDINGS: Perfusion: No decreased activity in the left ventricle on stress imaging to suggest reversible ischemia or infarction. Wall Motion: Normal left ventricular wall motion. No left ventricular dilation. Left Ventricular Ejection Fraction: 57 % End diastolic volume 57 ml End systolic volume 25 ml IMPRESSION: 1. No reversible ischemia or infarction. 2. Normal left ventricular wall motion. 3. Left ventricular ejection fraction 57% 4. Non invasive risk stratification*: Low risk *2012 Appropriate Use Criteria for Coronary Revascularization Focused Update: J Am Coll Cardiol. 209675;91(6):384-665http://content.[email or web address]?articleid=[email or web address] Electronically Signed   By: KeRolm Baptise.D.   On: 09/07/2015 14:20   Dg Chest Portable 1 View  09/04/2015  CLINICAL DATA:  Altered mental status EXAM: PORTABLE CHEST 1 VIEW COMPARISON:  01/26/2014 FINDINGS: Patient is status post median sternotomy. Mild to moderate cardiac enlargement.  Vascular pattern normal. Lungs clear. No pleural effusion. IMPRESSION: Cardiac enlargement similar to prior study. No acute findings otherwise. Electronically Signed   By: RaSkipper Cliche.D.   On: 09/04/2015 11:25    Micro Results     Recent Results (from the past 240 hour(s))  Blood culture (routine x 2)     Status: Abnormal   Collection Time: 09/04/15  8:55 AM  Result Value Ref Range Status   Specimen Description BLOOD RIGHT ARM  Final   Special Requests BOTTLES DRAWN AEROBIC AND ANAEROBIC  5CC  Final   Culture  Setup Time   Final    GRAM POSITIVE COCCI IN CLUSTERS AEROBIC BOTTLE ONLY CRITICAL RESULT CALLED TO, READ BACK BY AND VERIFIED WITH: MYER,J PHARMD 09/05/15 1707 W[place]  Culture (A)  Final    STAPHYLOCOCCUS SPECIES (COAGULASE NEGATIVE) THE SIGNIFICANCE OF ISOLATING THIS ORGANISM FROM A SINGLE SET OF BLOOD CULTURES WHEN MULTIPLE SETS ARE DRAWN IS UNCERTAIN. PLEASE NOTIFY THE MICROBIOLOGY DEPARTMENT WITHIN ONE WEEK IF SPECIATION AND SENSITIVITIES ARE REQUIRED.    Report Status 09/07/2015 FINAL  Final  Blood Culture ID Panel (Reflexed)  Status: Abnormal   Collection Time: 09/04/15  8:55 AM  Result Value Ref Range Status   Enterococcus species NOT DETECTED NOT DETECTED Final   Vancomycin resistance NOT DETECTED NOT DETECTED Final   Listeria monocytogenes NOT DETECTED NOT DETECTED Final   Staphylococcus species DETECTED (A) NOT DETECTED Final    Comment: CRITICAL RESULT CALLED TO, READ BACK BY AND VERIFIED WITH: MYER,J PHARMD 09/05/15 1707 WOOTEN,K    Staphylococcus aureus NOT DETECTED NOT DETECTED Final   Methicillin resistance DETECTED (A) NOT DETECTED Final    Comment: CRITICAL RESULT CALLED TO, READ BACK BY AND VERIFIED WITH: MYER,J PHARMD 09/05/15 1707 WOOTEN,K    Streptococcus species NOT DETECTED NOT DETECTED Final   Streptococcus agalactiae NOT DETECTED NOT DETECTED Final   Streptococcus pneumoniae NOT DETECTED NOT DETECTED Final   Streptococcus pyogenes NOT  DETECTED NOT DETECTED Final   Acinetobacter baumannii NOT DETECTED NOT DETECTED Final   Enterobacteriaceae species NOT DETECTED NOT DETECTED Final   Enterobacter cloacae complex NOT DETECTED NOT DETECTED Final   Escherichia coli NOT DETECTED NOT DETECTED Final   Klebsiella oxytoca NOT DETECTED NOT DETECTED Final   Klebsiella pneumoniae NOT DETECTED NOT DETECTED Final   Proteus species NOT DETECTED NOT DETECTED Final   Serratia marcescens NOT DETECTED NOT DETECTED Final   Carbapenem resistance NOT DETECTED NOT DETECTED Final   Haemophilus influenzae NOT DETECTED NOT DETECTED Final   Neisseria meningitidis NOT DETECTED NOT DETECTED Final   Pseudomonas aeruginosa NOT DETECTED NOT DETECTED Final   Candida albicans NOT DETECTED NOT DETECTED Final   Candida glabrata NOT DETECTED NOT DETECTED Final   Candida krusei NOT DETECTED NOT DETECTED Final   Candida parapsilosis NOT DETECTED NOT DETECTED Final   Candida tropicalis NOT DETECTED NOT DETECTED Final  Blood culture (routine x 2)     Status: None   Collection Time: 09/04/15  9:00 AM  Result Value Ref Range Status   Specimen Description BLOOD RIGHT ANTECUBITAL  Final   Special Requests BOTTLES DRAWN AEROBIC AND ANAEROBIC  10CC  Final   Culture NO GROWTH 5 DAYS  Final   Report Status 09/09/2015 FINAL  Final  Culture, blood (routine x 2)     Status: None   Collection Time: 09/04/15  3:55 PM  Result Value Ref Range Status   Specimen Description BLOOD RIGHT ANTECUBITAL  Final   Special Requests BOTTLES DRAWN AEROBIC AND ANAEROBIC 5CC  Final   Culture NO GROWTH 5 DAYS  Final   Report Status 09/09/2015 FINAL  Final  Culture, blood (routine x 2)     Status: None   Collection Time: 09/04/15  4:02 PM  Result Value Ref Range Status   Specimen Description BLOOD RIGHT HAND  Final   Special Requests BOTTLES DRAWN AEROBIC AND ANAEROBIC 5CC  Final   Culture NO GROWTH 5 DAYS  Final   Report Status 09/09/2015 FINAL  Final  Urine culture     Status:  None   Collection Time: 09/07/15  3:45 PM  Result Value Ref Range Status   Specimen Description URINE, CLEAN CATCH  Final   Special Requests NONE  Final   Culture NO GROWTH  Final   Report Status 09/08/2015 FINAL  Final       Today   Subjective    Delonta Yohannes is feeling better, his rash has improved, he denies any nausea or vomiting. No chest pain.  Objective   Blood pressure 161/77, pulse 91, temperature 98.1 F (36.7 C), temperature source Oral, resp. rate 18,  height '5\' 3"'$  (1.6 m), weight 81.647 kg (180 lb), SpO2 94 %.   Intake/Output Summary (Last 24 hours) at 09/10/15 0803 Last data filed at 09/09/15 1347  Gross per 24 hour  Intake    480 ml  Output      0 ml  Net    480 ml    Exam General. Patient awake and alert ENT. Oral mucosa moist no thrush or ulcers Neck. No JVD adenopathy Chest. Lungs clear to auscultation bilaterally no wheezing rales or rhonchi, heart S1-S2 present rhythmic no gallops or murmurs. Abdomen. Soft nontender nondistended Lower extremities. No edema Skin. Erythematous rash has been improved with ill-defined margins, noted scaling on the flexor area of the left elbow joint, no increased local temperature or drainage.   Data Review   CBC w Diff: Lab Results  Component Value Date   WBC 5.2 09/10/2015   HGB 12.5* 09/10/2015   HCT 38.4* 09/10/2015   PLT 100* 09/10/2015   LYMPHOPCT 21 09/10/2015   MONOPCT 14 09/10/2015   EOSPCT 2 09/10/2015   BASOPCT 0 09/10/2015    CMP: Lab Results  Component Value Date   NA 136 09/10/2015   K 4.6 09/10/2015   CL 105 09/10/2015   CO2 24 09/10/2015   BUN 9 09/10/2015   CREATININE 0.91 09/10/2015   CREATININE 1.00 08/20/2015   PROT 4.9* 09/06/2015   ALBUMIN 2.4* 09/06/2015   BILITOT 1.2 09/06/2015   ALKPHOS 55 09/06/2015   AST 15 09/06/2015   ALT 10* 09/06/2015  .   Total Time in preparing paper work, data evaluation and todays exam - 45 minutes  Tawni Millers M.D on 09/10/2015 at  8:03 AM  [facility]   Office  [phone]

## 2015-09-10 NOTE — Care Management Important Message (Signed)
Important Message  Patient Details  Name: Evan [name] MRN: [id number] Date of Birth: 03/19/1928   Medicare Important Message Given:  Yes    [name] 09/10/2015, 10:50 AM

## 2015-09-10 NOTE — Progress Notes (Signed)
PT Cancellation Note  Patient Details Name: Evan [name] MRN: [id number] DOB: Jan 03, 1928   Cancelled Treatment:    Reason Eval/Treat Not Completed: Patient declined, no reason specifiedPatient preparing to d/c and RN going over d/c summary. Patient denied any questions for PT at this time.    [place] April, PTA Pager: [phone]   09/10/2015, 11:32 AM

## 2015-09-14 DIAGNOSIS — G894 Chronic pain syndrome: Secondary | ICD-10-CM | POA: Diagnosis not present

## 2015-09-14 DIAGNOSIS — F341 Dysthymic disorder: Secondary | ICD-10-CM | POA: Diagnosis not present

## 2015-09-14 DIAGNOSIS — L03112 Cellulitis of left axilla: Secondary | ICD-10-CM | POA: Diagnosis not present

## 2015-09-14 DIAGNOSIS — M1990 Unspecified osteoarthritis, unspecified site: Secondary | ICD-10-CM | POA: Diagnosis not present

## 2015-09-14 DIAGNOSIS — M069 Rheumatoid arthritis, unspecified: Secondary | ICD-10-CM | POA: Diagnosis not present

## 2015-09-14 DIAGNOSIS — N400 Enlarged prostate without lower urinary tract symptoms: Secondary | ICD-10-CM | POA: Diagnosis not present

## 2015-09-14 DIAGNOSIS — E785 Hyperlipidemia, unspecified: Secondary | ICD-10-CM | POA: Diagnosis not present

## 2015-09-14 DIAGNOSIS — I2510 Atherosclerotic heart disease of native coronary artery without angina pectoris: Secondary | ICD-10-CM | POA: Diagnosis not present

## 2015-09-14 DIAGNOSIS — M545 Low back pain: Secondary | ICD-10-CM | POA: Diagnosis not present

## 2015-09-15 DIAGNOSIS — R21 Rash and other nonspecific skin eruption: Secondary | ICD-10-CM | POA: Diagnosis not present

## 2015-09-15 DIAGNOSIS — L298 Other pruritus: Secondary | ICD-10-CM | POA: Diagnosis not present

## 2015-09-17 ENCOUNTER — Ambulatory Visit (INDEPENDENT_AMBULATORY_CARE_PROVIDER_SITE_OTHER): Payer: Commercial Managed Care - HMO | Admitting: Physician Assistant

## 2015-09-17 ENCOUNTER — Encounter: Payer: Self-pay | Admitting: Physician Assistant

## 2015-09-17 VITALS — BP 114/66 | HR 68 | Temp 97.6°F | Resp 18 | Wt 177.0 lb

## 2015-09-17 DIAGNOSIS — R197 Diarrhea, unspecified: Secondary | ICD-10-CM | POA: Diagnosis not present

## 2015-09-17 DIAGNOSIS — Z09 Encounter for follow-up examination after completed treatment for conditions other than malignant neoplasm: Secondary | ICD-10-CM

## 2015-09-17 LAB — CBC WITH DIFFERENTIAL/PLATELET
Basophils Absolute: 0 cells/uL (ref 0–200)
Basophils Relative: 0 %
Eosinophils Absolute: 64 cells/uL (ref 15–500)
Eosinophils Relative: 1 %
HCT: 41.3 % (ref 38.5–50.0)
Hemoglobin: 13.7 g/dL (ref 13.0–17.0)
Lymphocytes Relative: 16 %
Lymphs Abs: 1024 cells/uL (ref 850–3900)
MCH: 31.1 pg (ref 27.0–33.0)
MCHC: 33.2 g/dL (ref 32.0–36.0)
MCV: 93.9 fL (ref 80.0–100.0)
MPV: 11.1 fL (ref 7.5–12.5)
Monocytes Absolute: 896 cells/uL (ref 200–950)
Monocytes Relative: 14 %
Neutro Abs: 4416 cells/uL (ref 1500–7800)
Neutrophils Relative %: 69 %
Platelets: 137 10*3/uL — ABNORMAL LOW (ref 140–400)
RBC: 4.4 MIL/uL (ref 4.20–5.80)
RDW: 12.9 % (ref 11.0–15.0)
WBC: 6.4 10*3/uL (ref 3.8–10.8)

## 2015-09-17 NOTE — Progress Notes (Signed)
Patient ID: Evan [name] MRN: [id number], DOB: 07-18-1927, 82 y.o. Date of Encounter: '@DATE'$ @  Chief Complaint:  Chief Complaint  Patient presents with  . weak,rash, diarrhea    out hosp 1 week ago, on abx (diarrhea)    HPI: 82 y.o. year old male  presents with his daughter for Ov today for f/u of recent Hospitalization.   Reviewed his Discharge Summary. Hospitalized 6/2 - 6/8.  See that Discharge Summary for more complete information.  Had a rash which had been diagnosed by Dermatolgy as  Pustular dermatitis. During hospitlaization, was felt that this has secodnary infection. Was evaluated by Inf. Dz. Was treated with Augmentin.         .  Today pt and daughter report he took the last augmentin yesterday morning--that completed the coarse.  His rash is much better.  He has had f/u with dermatology since his hospital d/c.  He is having diarrhea.  They report the diarrhea had started some during the hospitalization and has continued. Having 4 -5 per day. Watery. Has had C. Diff in past.   No vomiting. No abdominal Pain. No fever.   Past Medical History  Diagnosis Date  . CAD (coronary artery disease)   . BPH (benign prostatic hyperplasia)   . GERD (gastroesophageal reflux disease)   . Hypertension   . Anxiety   . History of ASCVD     MULTIVESSEL  . Anginal chest pain at rest [facility])     Chronic non, controlled on Lorazepam  . Hyperlipidemia   . Hemochromatosis     Possible, elevated iron stores, hook-like osteophytes on hand films, normal LFTs  . Esophageal ulcer without bleeding     bid ppi indefinitely  . Complication of anesthesia     "problems making water afterwards"  . Family history of adverse reaction to anesthesia     "children w/PONV"  . Myocardial infarction ([place]) 1999  . Pneumonia     "several times; got a little now" (09/04/2015)  . Hepatitis     "yellow jaundice as a baby"  . Headache     "couple/week maybe" (09/04/2015)  . Arthritis     "all over"   .  DJD (degenerative joint disease)   . Rheumatoid arthritis ([place])   . Osteoarthritis   . Chronic lower back pain   . Depression      Home Meds: Outpatient Prescriptions Prior to Visit  Medication Sig Dispense Refill  . atenolol (TENORMIN) 50 MG tablet TAKE 1 TABLET BY MOUTH  EVERY DAY 90 tablet 0  . clopidogrel (PLAVIX) 75 MG tablet TAKE 1 TABLET BY MOUTH  EVERY DAY 90 tablet 0  . diclofenac sodium (VOLTAREN) 1 % GEL Apply 2 g topically daily as needed (for pain in knees and feet).    Marland Kitchen FLUoxetine (PROZAC) 20 MG capsule TAKE ONE CAPSULE BY MOUTH DAILY 90 capsule 3  . hydrOXYzine (ATARAX/VISTARIL) 25 MG tablet Take 1 tablet (25 mg total) by mouth every 8 (eight) hours as needed for itching. 60 tablet 1  . leflunomide (ARAVA) 10 MG tablet Take 10 mg by mouth daily.     Marland Kitchen Lifitegrast (XIIDRA) 5 % SOLN Apply 1 drop to eye 2 (two) times daily. 1 each 3  . LORazepam (ATIVAN) 1 MG tablet TAKE ONE (1) TABLET BY MOUTH 3 TIMES DAILY. TAKE AT 8AM, 12 NOON, AND4PM. 90 tablet 2  . nitroGLYCERIN (NITROSTAT) 0.4 MG SL tablet Place 1 tablet (0.4 mg total) under the tongue every  5 (five) minutes as needed for chest pain. 25 tablet 0  . pantoprazole (PROTONIX) 40 MG tablet Take 1 tablet (40 mg total) by mouth 2 (two) times daily. TAKE 1 TABLET BY MOUTH EVERY DAY 90 tablet 0  . rosuvastatin (CRESTOR) 5 MG tablet TAKE 1 TABLET BY MOUTH  EVERY DAY 90 tablet 0  . saccharomyces boulardii (FLORASTOR) 250 MG capsule Take 1 capsule (250 mg total) by mouth 2 (two) times daily. 20 capsule 0  . tamsulosin (FLOMAX) 0.4 MG CAPS capsule TAKE 1 CAPSULE EVERY DAY 90 capsule 3  . amoxicillin-clavulanate (AUGMENTIN) 875-125 MG tablet Take 1 tablet by mouth every 12 (twelve) hours. 12 tablet 0  . betamethasone dipropionate (DIPROLENE) 0.05 % ointment Reported on 09/17/2015    . dapsone 25 MG tablet Take 50 mg by mouth daily. Reported on 0/62/6948    . folic acid (FOLVITE) 1 MG tablet Take 1 mg by mouth daily. Reported on  09/17/2015    . hydrocortisone 2.5 % cream APPLY TOPICALLY TWO TIMES DAILY (Patient not taking: Reported on 09/17/2015) 30 g 3  . mometasone (ELOCON) 0.1 % ointment APPLY TO THE AFFECTED AREAS DAILY AS DIRECTED (Patient not taking: Reported on 09/17/2015) 45 g 3  . predniSONE (DELTASONE) 10 MG tablet Reported on 09/17/2015    . sodium chloride (OCEAN) 0.65 % SOLN nasal spray Place 1 spray into both nostrils as needed for congestion. (Patient not taking: Reported on 09/17/2015) 1 Bottle 0  . temazepam (RESTORIL) 30 MG capsule Take 30 mg by mouth at bedtime as needed for sleep. Reported on 09/17/2015    . traZODone (DESYREL) 50 MG tablet TAKE TWO TABLETS BY MOUTH AT BEDTIME AS NEEDED FOR SLEEP (Patient not taking: Reported on 09/17/2015) 60 tablet 3   No facility-administered medications prior to visit.    Allergies:  Allergies  Allergen Reactions  . Feldene [Piroxicam] Other (See Comments)    REACTION: Blisters  . Statins Other (See Comments)    Myalgias  . Doxycycline Other (See Comments)    REACTION:  unknown  . Tequin Anxiety  . Valium Other (See Comments)    REACTION:  unknown    Social History   Social History  . Marital Status: Widowed    Spouse Name: N/A  . Number of Children: N/A  . Years of Education: N/A   Occupational History  . Not on file.   Social History Main Topics  . Smoking status: Never Smoker   . Smokeless tobacco: Former Systems developer    Types: Chew     Comment: "quit chewing in the 1960s"  . Alcohol Use: No  . Drug Use: No  . Sexual Activity: Not on file   Other Topics Concern  . Not on file   Social History Narrative    Family History  Problem Relation Age of Onset  . Arthritis-Osteo Sister      Review of Systems:  See HPI for pertinent ROS. All other ROS negative.    Physical Exam: Blood pressure 114/66, pulse 68, temperature 97.6 F (36.4 C), temperature source Oral, resp. rate 18, weight 177 lb (80.287 kg)., Body mass index is 31.36  kg/(m^2). General: WNWD WM. Appears in no acute distress. Neck: Supple. No thyromegaly. No lymphadenopathy. Lungs: Clear bilaterally to auscultation without wheezes, rales, or rhonchi. Breathing is unlabored. Heart: RRR with S1 S2. No murmurs, rubs, or gallops. Abdomen: Soft, non-tender, non-distended with normoactive bowel sounds. No hepatomegaly. No rebound/guarding. No obvious abdominal masses. No areas of tenderness with palpation  of entire abdomen.  Musculoskeletal:  Strength and tone normal for age. Extremities/Skin: Warm and dry. No clubbing or cyanosis. No edema. No rashes or suspicious lesions. Neuro: Alert and oriented X 3. Moves all extremities spontaneously. Gait is normal. CNII-XII grossly in tact. Psych:  Responds to questions appropriately with a normal affect.     ASSESSMENT AND PLAN:  82 y.o. year old male with  1. Hospital discharge follow-up Will obtain labs and stool studies including CDiff. He is to add ProBiotic daily. Will f/u lab results. He is to force fluids to prevent dehydration. Discussed this with pt and daughter.  - CBC with Differential/Platelet - COMPLETE METABOLIC PANEL WITH GFR - Clostridium difficile EIA - Ova and parasite examination - Stool culture; Future  2. Diarrhea, unspecified type - CBC with Differential/Platelet - COMPLETE METABOLIC PANEL WITH GFR - Clostridium difficile EIA - Ova and parasite examination - Stool culture; Future   Signed, Olean Ree Kemah, [place], [facility] 09/17/2015 12:50 PM

## 2015-09-18 ENCOUNTER — Other Ambulatory Visit: Payer: Commercial Managed Care - HMO

## 2015-09-18 ENCOUNTER — Other Ambulatory Visit: Payer: Self-pay | Admitting: Family Medicine

## 2015-09-18 DIAGNOSIS — R197 Diarrhea, unspecified: Secondary | ICD-10-CM | POA: Diagnosis not present

## 2015-09-18 DIAGNOSIS — Z09 Encounter for follow-up examination after completed treatment for conditions other than malignant neoplasm: Secondary | ICD-10-CM | POA: Diagnosis not present

## 2015-09-18 LAB — COMPLETE METABOLIC PANEL WITH GFR
ALT: 10 U/L (ref 9–46)
AST: 16 U/L (ref 10–35)
Albumin: 3.6 g/dL (ref 3.6–5.1)
Alkaline Phosphatase: 79 U/L (ref 40–115)
BUN: 17 mg/dL (ref 7–25)
CO2: 20 mmol/L (ref 20–31)
Calcium: 8.9 mg/dL (ref 8.6–10.3)
Chloride: 108 mmol/L (ref 98–110)
Creat: 0.98 mg/dL (ref 0.70–1.11)
GFR, Est African American: 80 mL/min (ref >=60)
GFR, Est Non African American: 70 mL/min (ref >=60)
Glucose, Bld: 120 mg/dL — ABNORMAL HIGH (ref 70–99)
Potassium: 4.1 mmol/L (ref 3.5–5.3)
Sodium: 137 mmol/L (ref 135–146)
Total Bilirubin: 1 mg/dL (ref 0.2–1.2)
Total Protein: 6 g/dL — ABNORMAL LOW (ref 6.1–8.1)

## 2015-09-18 LAB — OVA AND PARASITE EXAMINATION: OP: NONE SEEN

## 2015-09-19 LAB — CLOSTRIDIUM DIFFICILE BY PCR: Toxigenic C. Difficile by PCR: NOT DETECTED

## 2015-09-21 ENCOUNTER — Ambulatory Visit (INDEPENDENT_AMBULATORY_CARE_PROVIDER_SITE_OTHER): Payer: Commercial Managed Care - HMO | Admitting: Family Medicine

## 2015-09-21 ENCOUNTER — Encounter: Payer: Self-pay | Admitting: Family Medicine

## 2015-09-21 VITALS — BP 108/68 | HR 62 | Temp 96.7°F | Resp 14 | Ht 68.0 in | Wt 178.0 lb

## 2015-09-21 DIAGNOSIS — R5383 Other fatigue: Secondary | ICD-10-CM | POA: Diagnosis not present

## 2015-09-21 DIAGNOSIS — E538 Deficiency of other specified B group vitamins: Secondary | ICD-10-CM | POA: Diagnosis not present

## 2015-09-21 DIAGNOSIS — F329 Major depressive disorder, single episode, unspecified: Secondary | ICD-10-CM

## 2015-09-21 DIAGNOSIS — F32A Depression, unspecified: Secondary | ICD-10-CM

## 2015-09-21 DIAGNOSIS — E559 Vitamin D deficiency, unspecified: Secondary | ICD-10-CM | POA: Diagnosis not present

## 2015-09-21 LAB — CBC WITH DIFFERENTIAL/PLATELET
Basophils Absolute: 0 cells/uL (ref 0–200)
Basophils Relative: 0 %
Eosinophils Absolute: 57 cells/uL (ref 15–500)
Eosinophils Relative: 1 %
HCT: 40.5 % (ref 38.5–50.0)
Hemoglobin: 13.5 g/dL (ref 13.0–17.0)
Lymphocytes Relative: 21 %
Lymphs Abs: 1197 cells/uL (ref 850–3900)
MCH: 31.3 pg (ref 27.0–33.0)
MCHC: 33.3 g/dL (ref 32.0–36.0)
MCV: 93.8 fL (ref 80.0–100.0)
MPV: 11.2 fL (ref 7.5–12.5)
Monocytes Absolute: 684 cells/uL (ref 200–950)
Monocytes Relative: 12 %
Neutro Abs: 3762 cells/uL (ref 1500–7800)
Neutrophils Relative %: 66 %
Platelets: 182 10*3/uL (ref 140–400)
RBC: 4.32 MIL/uL (ref 4.20–5.80)
RDW: 12.8 % (ref 11.0–15.0)
WBC: 5.7 10*3/uL (ref 3.8–10.8)

## 2015-09-21 LAB — COMPLETE METABOLIC PANEL WITH GFR
ALT: 10 U/L (ref 9–46)
AST: 18 U/L (ref 10–35)
Albumin: 3.7 g/dL (ref 3.6–5.1)
Alkaline Phosphatase: 79 U/L (ref 40–115)
BUN: 15 mg/dL (ref 7–25)
CO2: 22 mmol/L (ref 20–31)
Calcium: 8.9 mg/dL (ref 8.6–10.3)
Chloride: 107 mmol/L (ref 98–110)
Creat: 1.04 mg/dL (ref 0.70–1.11)
GFR, Est African American: 75 mL/min (ref >=60)
GFR, Est Non African American: 65 mL/min (ref >=60)
Glucose, Bld: 145 mg/dL — ABNORMAL HIGH (ref 70–99)
Potassium: 4.6 mmol/L (ref 3.5–5.3)
Sodium: 140 mmol/L (ref 135–146)
Total Bilirubin: 0.5 mg/dL (ref 0.2–1.2)
Total Protein: 6 g/dL — ABNORMAL LOW (ref 6.1–8.1)

## 2015-09-21 LAB — TSH: TSH: 1.19 mIU/L (ref 0.40–4.50)

## 2015-09-21 LAB — VITAMIN B12: Vitamin B-12: 304 pg/mL (ref 200–1100)

## 2015-09-21 MED ORDER — ARIPIPRAZOLE 5 MG PO TABS: 5.0000 mg | ORAL_TABLET | Freq: Every day | ORAL | Status: DC

## 2015-09-21 NOTE — Progress Notes (Signed)
Subjective:    Patient ID: Evan [name], male    DOB: 05-09-1927, 82 y.o.   MRN: [id number]  HPI  Patient has a long-standing history of anxiety. He has been on lorazepam 3 times a day for more than 10 years. He was on this medication prior to my becoming a doctor. In the past we have tried him on Celexa, Prozac, and venlafaxine to help manage his anxiety with marginal success. Recently he was admitted the hospital with a staph infection. Since coming home he has felt extremely weak and tired. He also is shaking uncontrollably per his report. He feels extremely nervous. He is unable to sleep. He is not running any fever. The shaking is not seizure activity. As soon as he takes lorazepam, the shaking stops immediately within seconds of taking the pill. Both his daughter and myself feel that is his anxiety. However he is on extremely high doses of benzodiazepines given his age. I explained this to the patient at length as well as his daughter that I feel increasing the medication anymore puts him at risk of respiratory depression. They are here for other options try to help manage his anxiety and improve his quality of life daily history is significant for a daughter who try to commit suicide with schizophrenia as well as a daughter with bipolar. Also has a son with bipolar. Past Medical History  Diagnosis Date  . CAD (coronary artery disease)   . BPH (benign prostatic hyperplasia)   . GERD (gastroesophageal reflux disease)   . Hypertension   . Anxiety   . History of ASCVD     MULTIVESSEL  . Anginal chest pain at rest [facility])     Chronic non, controlled on Lorazepam  . Hyperlipidemia   . Hemochromatosis     Possible, elevated iron stores, hook-like osteophytes on hand films, normal LFTs  . Esophageal ulcer without bleeding     bid ppi indefinitely  . Complication of anesthesia     "problems making water afterwards"  . Family history of adverse reaction to anesthesia     "children w/PONV"  .  Myocardial infarction ([place]) 1999  . Pneumonia     "several times; got a little now" (09/04/2015)  . Hepatitis     "yellow jaundice as a baby"  . Headache     "couple/week maybe" (09/04/2015)  . Arthritis     "all over"   . DJD (degenerative joint disease)   . Rheumatoid arthritis ([place])   . Osteoarthritis   . Chronic lower back pain   . Depression    Past Surgical History  Procedure Laterality Date  . Cholecystectomy open    . Shoulder open rotator cuff repair Right   . Total knee arthroplasty Bilateral   . Coronary artery bypass graft  1999  . Descending aortic aneurysm repair w/ stent      DE stent ostium into the right radial free graft at OM-, 02-2007  . Esophagogastroduodenoscopy N/A 11/09/2014    Procedure: ESOPHAGOGASTRODUODENOSCOPY (EGD);  Surgeon: [name], MD;  Location: [facility] ENDOSCOPY;  Service: Endoscopy;  Laterality: N/A;  . Inguinal hernia repair    . Joint replacement    . Nasal sinus surgery    . Arm skin lesion biopsy / excision Left 09/02/2015  . Coronary angioplasty with stent placement    . Cataract extraction w/ intraocular lens  implant, bilateral Bilateral   . Corneal transplant Bilateral     "one at [facility]; one at [facility]  Current Outpatient Prescriptions on File Prior to Visit  Medication Sig Dispense Refill  . atenolol (TENORMIN) 50 MG tablet TAKE 1 TABLET BY MOUTH  EVERY DAY 90 tablet 0  . betamethasone dipropionate (DIPROLENE) 0.05 % ointment Reported on 09/17/2015    . clopidogrel (PLAVIX) 75 MG tablet TAKE 1 TABLET BY MOUTH  EVERY DAY 90 tablet 0  . dapsone 25 MG tablet Take 50 mg by mouth daily. Reported on 09/17/2015    . diclofenac sodium (VOLTAREN) 1 % GEL Apply 2 g topically daily as needed (for pain in knees and feet).    Marland Kitchen FLUoxetine (PROZAC) 20 MG capsule TAKE ONE CAPSULE BY MOUTH DAILY 90 capsule 3  . folic acid (FOLVITE) 1 MG tablet Take 1 mg by mouth daily. Reported on 09/17/2015    . hydrOXYzine (ATARAX/VISTARIL) 25 MG tablet Take 1 tablet (25 mg  total) by mouth every 8 (eight) hours as needed for itching. 60 tablet 1  . leflunomide (ARAVA) 10 MG tablet Take 10 mg by mouth daily.     Marland Kitchen Lifitegrast (XIIDRA) 5 % SOLN Apply 1 drop to eye 2 (two) times daily. 1 each 3  . LORazepam (ATIVAN) 1 MG tablet TAKE ONE (1) TABLET BY MOUTH 3 TIMES DAILY. TAKE AT 8AM, 12 NOON, AND4PM. (Patient taking differently: TAKE ONE (1) TABLET BY MOUTH 3- 4 TIMES DAILY. TAKE AT 8AM, 12 NOON, AND4PM.) 90 tablet 2  . nitroGLYCERIN (NITROSTAT) 0.4 MG SL tablet Place 1 tablet (0.4 mg total) under the tongue every 5 (five) minutes as needed for chest pain. 25 tablet 0  . pantoprazole (PROTONIX) 40 MG tablet Take 1 tablet (40 mg total) by mouth 2 (two) times daily. TAKE 1 TABLET BY MOUTH EVERY DAY 90 tablet 0  . predniSONE (DELTASONE) 10 MG tablet Reported on 09/17/2015    . rosuvastatin (CRESTOR) 5 MG tablet TAKE 1 TABLET BY MOUTH  EVERY DAY 90 tablet 0  . saccharomyces boulardii (FLORASTOR) 250 MG capsule Take 1 capsule (250 mg total) by mouth 2 (two) times daily. 20 capsule 0  . tamsulosin (FLOMAX) 0.4 MG CAPS capsule TAKE 1 CAPSULE EVERY DAY 90 capsule 3  . temazepam (RESTORIL) 30 MG capsule Take 30 mg by mouth at bedtime as needed for sleep. Reported on 09/17/2015    . traZODone (DESYREL) 50 MG tablet TAKE TWO TABLETS BY MOUTH AT BEDTIME AS NEEDED FOR SLEEP 60 tablet 3   No current facility-administered medications on file prior to visit.   Allergies  Allergen Reactions  . Feldene [Piroxicam] Other (See Comments)    REACTION: Blisters  . Statins Other (See Comments)    Myalgias  . Doxycycline Other (See Comments)    REACTION:  unknown  . Tequin Anxiety  . Valium Other (See Comments)    REACTION:  unknown   Social History   Social History  . Marital Status: Widowed    Spouse Name: N/A  . Number of Children: N/A  . Years of Education: N/A   Occupational History  . Not on file.   Social History Main Topics  . Smoking status: Never Smoker   .  Smokeless tobacco: Former Systems developer    Types: Chew     Comment: "quit chewing in the 1960s"  . Alcohol Use: No  . Drug Use: No  . Sexual Activity: Not on file   Other Topics Concern  . Not on file   Social History Narrative     Review of Systems  All other systems reviewed  and are negative.      Objective:   Physical Exam  Constitutional: He appears well-developed and well-nourished.  Cardiovascular: Normal rate, regular rhythm and normal heart sounds.   No murmur heard. Pulmonary/Chest: Effort normal and breath sounds normal. No respiratory distress. He has no wheezes. He has no rales.  Abdominal: Soft. Bowel sounds are normal. He exhibits no distension. There is no tenderness. There is no rebound and no guarding.  Psychiatric: His mood appears anxious.  Vitals reviewed.         Assessment & Plan:  Depression - Plan: ARIPiprazole (ABILIFY) 5 MG tablet  Other fatigue - Plan: CBC with Differential/Platelet, COMPLETE METABOLIC PANEL WITH GFR, TSH, VITAMIN D 25 Hydroxy (Vit-D Deficiency, Fractures), Vitamin B12  I believe that the majority of his weakness and shaking is psychosomatic and related to anxiety. However his options are limited. I recommended a psychiatry referral. We also discussed possibly adding Abilify they'll manage his depression and his anxiety off label. Particular given his family history of bipolar syndrome and schizophrenia, I believe this medication can potentially be beneficial. He has failed more conservative options and I certainly do not want to increase his benzodiazepines. I recommended discontinuing Restoril and replacing with Abilify 5 mg by mouth daily at bedtime and recheck in 3-4 weeks. Given his history of fatigue, I will also check baseline lab work to evaluate for other potential causes of fatigue although I believe this is likely related to his anxiety, his depression, and his recent hospitalization

## 2015-09-22 DIAGNOSIS — N400 Enlarged prostate without lower urinary tract symptoms: Secondary | ICD-10-CM | POA: Diagnosis not present

## 2015-09-22 DIAGNOSIS — M1990 Unspecified osteoarthritis, unspecified site: Secondary | ICD-10-CM | POA: Diagnosis not present

## 2015-09-22 DIAGNOSIS — G894 Chronic pain syndrome: Secondary | ICD-10-CM | POA: Diagnosis not present

## 2015-09-22 DIAGNOSIS — I2510 Atherosclerotic heart disease of native coronary artery without angina pectoris: Secondary | ICD-10-CM | POA: Diagnosis not present

## 2015-09-22 DIAGNOSIS — M069 Rheumatoid arthritis, unspecified: Secondary | ICD-10-CM | POA: Diagnosis not present

## 2015-09-22 DIAGNOSIS — E785 Hyperlipidemia, unspecified: Secondary | ICD-10-CM | POA: Diagnosis not present

## 2015-09-22 DIAGNOSIS — F341 Dysthymic disorder: Secondary | ICD-10-CM | POA: Diagnosis not present

## 2015-09-22 DIAGNOSIS — L03112 Cellulitis of left axilla: Secondary | ICD-10-CM | POA: Diagnosis not present

## 2015-09-22 DIAGNOSIS — M545 Low back pain: Secondary | ICD-10-CM | POA: Diagnosis not present

## 2015-09-22 LAB — VITAMIN D 25 HYDROXY (VIT D DEFICIENCY, FRACTURES): Vit D, 25-Hydroxy: 27 ng/mL — ABNORMAL LOW (ref 30–100)

## 2015-09-22 LAB — STOOL CULTURE

## 2015-09-23 ENCOUNTER — Other Ambulatory Visit: Payer: Self-pay | Admitting: Family Medicine

## 2015-09-23 DIAGNOSIS — F341 Dysthymic disorder: Secondary | ICD-10-CM | POA: Diagnosis not present

## 2015-09-23 DIAGNOSIS — N400 Enlarged prostate without lower urinary tract symptoms: Secondary | ICD-10-CM | POA: Diagnosis not present

## 2015-09-23 DIAGNOSIS — G894 Chronic pain syndrome: Secondary | ICD-10-CM | POA: Diagnosis not present

## 2015-09-23 DIAGNOSIS — M1990 Unspecified osteoarthritis, unspecified site: Secondary | ICD-10-CM | POA: Diagnosis not present

## 2015-09-23 DIAGNOSIS — E785 Hyperlipidemia, unspecified: Secondary | ICD-10-CM | POA: Diagnosis not present

## 2015-09-23 DIAGNOSIS — L03112 Cellulitis of left axilla: Secondary | ICD-10-CM | POA: Diagnosis not present

## 2015-09-23 DIAGNOSIS — M069 Rheumatoid arthritis, unspecified: Secondary | ICD-10-CM | POA: Diagnosis not present

## 2015-09-23 DIAGNOSIS — I2510 Atherosclerotic heart disease of native coronary artery without angina pectoris: Secondary | ICD-10-CM | POA: Diagnosis not present

## 2015-09-23 DIAGNOSIS — M545 Low back pain: Secondary | ICD-10-CM | POA: Diagnosis not present

## 2015-09-23 MED ORDER — VITAMIN D (ERGOCALCIFEROL) 1.25 MG (50000 UNIT) PO CAPS: 50000.0000 [IU] | ORAL_CAPSULE | ORAL | Status: DC

## 2015-09-28 DIAGNOSIS — F341 Dysthymic disorder: Secondary | ICD-10-CM | POA: Diagnosis not present

## 2015-09-28 DIAGNOSIS — L03112 Cellulitis of left axilla: Secondary | ICD-10-CM | POA: Diagnosis not present

## 2015-09-28 DIAGNOSIS — I2510 Atherosclerotic heart disease of native coronary artery without angina pectoris: Secondary | ICD-10-CM | POA: Diagnosis not present

## 2015-09-28 DIAGNOSIS — M545 Low back pain: Secondary | ICD-10-CM | POA: Diagnosis not present

## 2015-09-28 DIAGNOSIS — M069 Rheumatoid arthritis, unspecified: Secondary | ICD-10-CM | POA: Diagnosis not present

## 2015-09-28 DIAGNOSIS — E785 Hyperlipidemia, unspecified: Secondary | ICD-10-CM | POA: Diagnosis not present

## 2015-09-28 DIAGNOSIS — N400 Enlarged prostate without lower urinary tract symptoms: Secondary | ICD-10-CM | POA: Diagnosis not present

## 2015-09-28 DIAGNOSIS — G894 Chronic pain syndrome: Secondary | ICD-10-CM | POA: Diagnosis not present

## 2015-09-28 DIAGNOSIS — M1990 Unspecified osteoarthritis, unspecified site: Secondary | ICD-10-CM | POA: Diagnosis not present

## 2015-09-30 DIAGNOSIS — M545 Low back pain: Secondary | ICD-10-CM | POA: Diagnosis not present

## 2015-09-30 DIAGNOSIS — N400 Enlarged prostate without lower urinary tract symptoms: Secondary | ICD-10-CM | POA: Diagnosis not present

## 2015-09-30 DIAGNOSIS — M069 Rheumatoid arthritis, unspecified: Secondary | ICD-10-CM | POA: Diagnosis not present

## 2015-09-30 DIAGNOSIS — M1990 Unspecified osteoarthritis, unspecified site: Secondary | ICD-10-CM | POA: Diagnosis not present

## 2015-09-30 DIAGNOSIS — G894 Chronic pain syndrome: Secondary | ICD-10-CM | POA: Diagnosis not present

## 2015-09-30 DIAGNOSIS — L03112 Cellulitis of left axilla: Secondary | ICD-10-CM | POA: Diagnosis not present

## 2015-09-30 DIAGNOSIS — F341 Dysthymic disorder: Secondary | ICD-10-CM | POA: Diagnosis not present

## 2015-09-30 DIAGNOSIS — I2510 Atherosclerotic heart disease of native coronary artery without angina pectoris: Secondary | ICD-10-CM | POA: Diagnosis not present

## 2015-09-30 DIAGNOSIS — E785 Hyperlipidemia, unspecified: Secondary | ICD-10-CM | POA: Diagnosis not present

## 2015-10-02 ENCOUNTER — Telehealth: Payer: Self-pay | Admitting: Family Medicine

## 2015-10-02 NOTE — Telephone Encounter (Signed)
Pt's daughter is calling for his lab results. [name]

## 2015-10-05 DIAGNOSIS — Z5181 Encounter for therapeutic drug level monitoring: Secondary | ICD-10-CM | POA: Diagnosis not present

## 2015-10-05 DIAGNOSIS — L308 Other specified dermatitis: Secondary | ICD-10-CM | POA: Diagnosis not present

## 2015-10-05 DIAGNOSIS — R21 Rash and other nonspecific skin eruption: Secondary | ICD-10-CM | POA: Diagnosis not present

## 2015-10-05 NOTE — Telephone Encounter (Signed)
LMTRC

## 2015-10-07 DIAGNOSIS — Z5181 Encounter for therapeutic drug level monitoring: Secondary | ICD-10-CM | POA: Diagnosis not present

## 2015-10-09 ENCOUNTER — Ambulatory Visit (INDEPENDENT_AMBULATORY_CARE_PROVIDER_SITE_OTHER): Payer: Commercial Managed Care - HMO | Admitting: Family Medicine

## 2015-10-09 ENCOUNTER — Encounter: Payer: Self-pay | Admitting: Family Medicine

## 2015-10-09 VITALS — BP 98/60 | HR 76 | Temp 98.4°F | Resp 16 | Ht 68.0 in | Wt 179.0 lb

## 2015-10-09 DIAGNOSIS — F329 Major depressive disorder, single episode, unspecified: Secondary | ICD-10-CM

## 2015-10-09 DIAGNOSIS — F32A Depression, unspecified: Secondary | ICD-10-CM

## 2015-10-09 MED ORDER — ARIPIPRAZOLE 5 MG PO TABS: 5.0000 mg | ORAL_TABLET | Freq: Every day | ORAL | Status: DC

## 2015-10-09 NOTE — Progress Notes (Signed)
Subjective:    Patient ID: Evan [name], male    DOB: 1927-11-14, 82 y.o.   MRN: [id number]  HPI  09/21/15 Patient has a long-standing history of anxiety. He has been on lorazepam 3 times a day for more than 10 years. He was on this medication prior to my becoming a doctor. In the past we have tried him on Celexa, Prozac, and venlafaxine to help manage his anxiety with marginal success. Recently he was admitted the hospital with a staph infection. Since coming home he has felt extremely weak and tired. He also is shaking uncontrollably per his report. He feels extremely nervous. He is unable to sleep. He is not running any fever. The shaking is not seizure activity. As soon as he takes lorazepam, the shaking stops immediately within seconds of taking the pill. Both his daughter and myself feel that is his anxiety. However he is on extremely high doses of benzodiazepines given his age. I explained this to the patient at length as well as his daughter that I feel increasing the medication anymore puts him at risk of respiratory depression. They are here for other options try to help manage his anxiety and improve his quality of life daily history is significant for a daughter who try to commit suicide with schizophrenia as well as a daughter with bipolar. Also has a son with bipolar.  At that time, my plan was:  I believe that the majority of his weakness and shaking is psychosomatic and related to anxiety. However his options are limited. I recommended a psychiatry referral. We also discussed possibly adding Abilify they'll manage his depression and his anxiety off label. Particularly given his family history of bipolar syndrome and schizophrenia, I believe this medication can potentially be beneficial. He has failed more conservative options and I certainly do not want to increase his benzodiazepines. I recommended discontinuing Restoril and replacing with Abilify 5 mg by mouth daily at bedtime and recheck  in 3-4 weeks. Given his history of fatigue, I will also check baseline lab work to evaluate for other potential causes of fatigue although I believe this is likely related to his anxiety, his depression, and his recent hospitalization.  10/09/15 Outside of vit d deficiency, no significant cause for fatigue was found.  Overall he feels better on the Abilify. His primary concern is that he feels very jittery in the morning when he wakes up. He is taking lorazepam at 8 AM, noon, and 4 PM. He feels fine during the day but is in the morning that the jitteriness returns. This is the period of time that he has the longus removed from his benzodiazepine. We discontinued temazepam at night he also complains of some mild pain in both knees and weakness with walking. Office Visit on 09/21/2015  Component Date Value Ref Range Status  . WBC 09/21/2015 5.7  3.8 - 10.8 K/uL Final  . RBC 09/21/2015 4.32  4.20 - 5.80 MIL/uL Final  . Hemoglobin 09/21/2015 13.5  13.0 - 17.0 g/dL Final  . HCT 09/21/2015 40.5  38.5 - 50.0 % Final  . MCV 09/21/2015 93.8  80.0 - 100.0 fL Final  . MCH 09/21/2015 31.3  27.0 - 33.0 pg Final  . MCHC 09/21/2015 33.3  32.0 - 36.0 g/dL Final  . RDW 09/21/2015 12.8  11.0 - 15.0 % Final  . Platelets 09/21/2015 182  140 - 400 K/uL Final  . MPV 09/21/2015 11.2  7.5 - 12.5 fL Final  . Neutro Abs  09/21/2015 3762  1500 - 7800 cells/uL Final  . Lymphs Abs 09/21/2015 1197  850 - 3900 cells/uL Final  . Monocytes Absolute 09/21/2015 684  200 - 950 cells/uL Final  . Eosinophils Absolute 09/21/2015 57  15 - 500 cells/uL Final  . Basophils Absolute 09/21/2015 0  0 - 200 cells/uL Final  . Neutrophils Relative % 09/21/2015 66   Final  . Lymphocytes Relative 09/21/2015 21   Final  . Monocytes Relative 09/21/2015 12   Final  . Eosinophils Relative 09/21/2015 1   Final  . Basophils Relative 09/21/2015 0   Final  . Smear Review 09/21/2015 Criteria for review not met   Final   ** Please note change in unit  of measure and reference range(s). **  . Sodium 09/21/2015 140  135 - 146 mmol/L Final  . Potassium 09/21/2015 4.6  3.5 - 5.3 mmol/L Final  . Chloride 09/21/2015 107  98 - 110 mmol/L Final  . CO2 09/21/2015 22  20 - 31 mmol/L Final  . Glucose, Bld 09/21/2015 145* 70 - 99 mg/dL Final  . BUN 09/21/2015 15  7 - 25 mg/dL Final  . Creat 09/21/2015 1.04  0.70 - 1.11 mg/dL Final   Comment:   For patients > or = 80 years of age: The upper reference limit for Creatinine is approximately 13% higher for people identified as African-American.     . Total Bilirubin 09/21/2015 0.5  0.2 - 1.2 mg/dL Final  . Alkaline Phosphatase 09/21/2015 79  40 - 115 U/L Final  . AST 09/21/2015 18  10 - 35 U/L Final  . ALT 09/21/2015 10  9 - 46 U/L Final  . Total Protein 09/21/2015 6.0* 6.1 - 8.1 g/dL Final  . Albumin 09/21/2015 3.7  3.6 - 5.1 g/dL Final  . Calcium 09/21/2015 8.9  8.6 - 10.3 mg/dL Final  . GFR, Est African American 09/21/2015 75  >=60 mL/min Final  . GFR, Est Non African American 09/21/2015 65  >=60 mL/min Final  . TSH 09/21/2015 1.19  0.40 - 4.50 mIU/L Final  . Vit D, 25-Hydroxy 09/21/2015 27* 30 - 100 ng/mL Final   Comment: Vitamin D Status           25-OH Vitamin D        Deficiency                <20 ng/mL        Insufficiency         20 - 29 ng/mL        Optimal             > or = 30 ng/mL   For 25-OH Vitamin D testing on patients on D2-supplementation and patients for whom quantitation of D2 and D3 fractions is required, the QuestAssureD 25-OH VIT D, (D2,D3), LC/MS/MS is recommended: order code [id number] (patients > 2 yrs).   . Vitamin B-12 09/21/2015 304  200 - 1100 pg/mL Final  Appointment on 09/18/2015  Component Date Value Ref Range Status  . Organism ID, Bacteria 09/18/2015 No Salmonella,Shigella,Campylobacter,Yersinia,or   Final  . Organism ID, Bacteria 09/18/2015 No E.coli [id number]:H7 isolated.   Final  . Toxigenic C Difficile by pcr 09/18/2015 Not Detected  Not Detected Final    Comment: This test is for use only with liquid or soft stools; performance characteristics of other clinical specimen types have not been established.   This assay was performed by Cepheid GeneXpert(R) PCR. The performance characteristics of this assay have been  determined by [employer]. Performance characteristics refer to the analytical performance of the test.   Office Visit on 09/17/2015  Component Date Value Ref Range Status  . WBC 09/17/2015 6.4  3.8 - 10.8 K/uL Final  . RBC 09/17/2015 4.40  4.20 - 5.80 MIL/uL Final  . Hemoglobin 09/17/2015 13.7  13.0 - 17.0 g/dL Final  . HCT 09/17/2015 41.3  38.5 - 50.0 % Final  . MCV 09/17/2015 93.9  80.0 - 100.0 fL Final  . MCH 09/17/2015 31.1  27.0 - 33.0 pg Final  . MCHC 09/17/2015 33.2  32.0 - 36.0 g/dL Final  . RDW 09/17/2015 12.9  11.0 - 15.0 % Final  . Platelets 09/17/2015 137* 140 - 400 K/uL Final  . MPV 09/17/2015 11.1  7.5 - 12.5 fL Final  . Neutro Abs 09/17/2015 4416  1500 - 7800 cells/uL Final  . Lymphs Abs 09/17/2015 1024  850 - 3900 cells/uL Final  . Monocytes Absolute 09/17/2015 896  200 - 950 cells/uL Final  . Eosinophils Absolute 09/17/2015 64  15 - 500 cells/uL Final  . Basophils Absolute 09/17/2015 0  0 - 200 cells/uL Final  . Neutrophils Relative % 09/17/2015 69   Final  . Lymphocytes Relative 09/17/2015 16   Final  . Monocytes Relative 09/17/2015 14   Final  . Eosinophils Relative 09/17/2015 1   Final  . Basophils Relative 09/17/2015 0   Final  . Smear Review 09/17/2015 Criteria for review not met   Final   ** Please note change in unit of measure and reference range(s). **  . Sodium 09/17/2015 137  135 - 146 mmol/L Final  . Potassium 09/17/2015 4.1  3.5 - 5.3 mmol/L Final  . Chloride 09/17/2015 108  98 - 110 mmol/L Final  . CO2 09/17/2015 20  20 - 31 mmol/L Final  . Glucose, Bld 09/17/2015 120* 70 - 99 mg/dL Final  . BUN 09/17/2015 17  7 - 25 mg/dL Final  . Creat 09/17/2015 0.98  0.70 - 1.11 mg/dL Final     Comment:   For patients > or = 80 years of age: The upper reference limit for Creatinine is approximately 13% higher for people identified as African-American.     . Total Bilirubin 09/17/2015 1.0  0.2 - 1.2 mg/dL Final  . Alkaline Phosphatase 09/17/2015 79  40 - 115 U/L Final  . AST 09/17/2015 16  10 - 35 U/L Final  . ALT 09/17/2015 10  9 - 46 U/L Final  . Total Protein 09/17/2015 6.0* 6.1 - 8.1 g/dL Final  . Albumin 09/17/2015 3.6  3.6 - 5.1 g/dL Final  . Calcium 09/17/2015 8.9  8.6 - 10.3 mg/dL Final  . GFR, Est African American 09/17/2015 80  >=60 mL/min Final  . GFR, Est Non African American 09/17/2015 70  >=60 mL/min Final  . OP 09/17/2015 No Ova or Parasites Seen    Final  Admission on 09/04/2015, Discharged on 09/10/2015  No results displayed because visit has over 200 results.       Past Medical History  Diagnosis Date  . CAD (coronary artery disease)   . BPH (benign prostatic hyperplasia)   . GERD (gastroesophageal reflux disease)   . Hypertension   . Anxiety   . History of ASCVD     MULTIVESSEL  . Anginal chest pain at rest [facility])     Chronic non, controlled on Lorazepam  . Hyperlipidemia   . Hemochromatosis     Possible, elevated iron stores, hook-like osteophytes  on hand films, normal LFTs  . Esophageal ulcer without bleeding     bid ppi indefinitely  . Complication of anesthesia     "problems making water afterwards"  . Family history of adverse reaction to anesthesia     "children w/PONV"  . Myocardial infarction ([place]) 1999  . Pneumonia     "several times; got a little now" (09/04/2015)  . Hepatitis     "yellow jaundice as a baby"  . Headache     "couple/week maybe" (09/04/2015)  . Arthritis     "all over"   . DJD (degenerative joint disease)   . Rheumatoid arthritis ([place])   . Osteoarthritis   . Chronic lower back pain   . Depression    Past Surgical History  Procedure Laterality Date  . Cholecystectomy open    . Shoulder open rotator cuff  repair Right   . Total knee arthroplasty Bilateral   . Coronary artery bypass graft  1999  . Descending aortic aneurysm repair w/ stent      DE stent ostium into the right radial free graft at OM-, 02-2007  . Esophagogastroduodenoscopy N/A 11/09/2014    Procedure: ESOPHAGOGASTRODUODENOSCOPY (EGD);  Surgeon: [name], MD;  Location: [facility] ENDOSCOPY;  Service: Endoscopy;  Laterality: N/A;  . Inguinal hernia repair    . Joint replacement    . Nasal sinus surgery    . Arm skin lesion biopsy / excision Left 09/02/2015  . Coronary angioplasty with stent placement    . Cataract extraction w/ intraocular lens  implant, bilateral Bilateral   . Corneal transplant Bilateral     "one at [facility]; one at [facility]"   Current Outpatient Prescriptions on File Prior to Visit  Medication Sig Dispense Refill  . ARIPiprazole (ABILIFY) 5 MG tablet Take 1 tablet (5 mg total) by mouth at bedtime. 30 tablet 1  . atenolol (TENORMIN) 50 MG tablet TAKE 1 TABLET BY MOUTH  EVERY DAY 90 tablet 0  . betamethasone dipropionate (DIPROLENE) 0.05 % ointment Reported on 09/17/2015    . clopidogrel (PLAVIX) 75 MG tablet TAKE 1 TABLET BY MOUTH  EVERY DAY 90 tablet 0  . dapsone 25 MG tablet Take 50 mg by mouth daily. Reported on 09/17/2015    . diclofenac sodium (VOLTAREN) 1 % GEL Apply 2 g topically daily as needed (for pain in knees and feet).    Marland Kitchen FLUoxetine (PROZAC) 20 MG capsule TAKE ONE CAPSULE BY MOUTH DAILY 90 capsule 3  . folic acid (FOLVITE) 1 MG tablet Take 1 mg by mouth daily. Reported on 09/17/2015    . hydrOXYzine (ATARAX/VISTARIL) 25 MG tablet Take 1 tablet (25 mg total) by mouth every 8 (eight) hours as needed for itching. 60 tablet 1  . leflunomide (ARAVA) 10 MG tablet Take 10 mg by mouth daily.     Marland Kitchen Lifitegrast (XIIDRA) 5 % SOLN Apply 1 drop to eye 2 (two) times daily. 1 each 3  . LORazepam (ATIVAN) 1 MG tablet TAKE ONE (1) TABLET BY MOUTH 3 TIMES DAILY. TAKE AT 8AM, 12 NOON, AND4PM. (Patient taking differently: TAKE ONE  (1) TABLET BY MOUTH 3- 4 TIMES DAILY. TAKE AT 8AM, 12 NOON, AND4PM.) 90 tablet 2  . nitroGLYCERIN (NITROSTAT) 0.4 MG SL tablet Place 1 tablet (0.4 mg total) under the tongue every 5 (five) minutes as needed for chest pain. 25 tablet 0  . pantoprazole (PROTONIX) 40 MG tablet Take 1 tablet (40 mg total) by mouth 2 (two) times daily. TAKE 1 TABLET BY  MOUTH EVERY DAY 90 tablet 0  . predniSONE (DELTASONE) 10 MG tablet Reported on 09/17/2015    . rosuvastatin (CRESTOR) 5 MG tablet TAKE 1 TABLET BY MOUTH  EVERY DAY 90 tablet 0  . saccharomyces boulardii (FLORASTOR) 250 MG capsule Take 1 capsule (250 mg total) by mouth 2 (two) times daily. 20 capsule 0  . tamsulosin (FLOMAX) 0.4 MG CAPS capsule TAKE 1 CAPSULE EVERY DAY 90 capsule 3  . temazepam (RESTORIL) 30 MG capsule Take 30 mg by mouth at bedtime as needed for sleep. Reported on 09/17/2015    . traZODone (DESYREL) 50 MG tablet TAKE TWO TABLETS BY MOUTH AT BEDTIME AS NEEDED FOR SLEEP 60 tablet 3  . Vitamin D, Ergocalciferol, (DRISDOL) 50000 units CAPS capsule Take 1 capsule (50,000 Units total) by mouth every 7 (seven) days. 4 capsule 3   No current facility-administered medications on file prior to visit.   Allergies  Allergen Reactions  . Feldene [Piroxicam] Other (See Comments)    REACTION: Blisters  . Statins Other (See Comments)    Myalgias  . Doxycycline Other (See Comments)    REACTION:  unknown  . Tequin Anxiety  . Valium Other (See Comments)    REACTION:  unknown   Social History   Social History  . Marital Status: Widowed    Spouse Name: N/A  . Number of Children: N/A  . Years of Education: N/A   Occupational History  . Not on file.   Social History Main Topics  . Smoking status: Never Smoker   . Smokeless tobacco: Former Systems developer    Types: Chew     Comment: "quit chewing in the 1960s"  . Alcohol Use: No  . Drug Use: No  . Sexual Activity: Not on file   Other Topics Concern  . Not on file   Social History Narrative      Review of Systems  All other systems reviewed and are negative.      Objective:   Physical Exam  Constitutional: He appears well-developed and well-nourished.  Cardiovascular: Normal rate, regular rhythm and normal heart sounds.   No murmur heard. Pulmonary/Chest: Effort normal and breath sounds normal. No respiratory distress. He has no wheezes. He has no rales.  Abdominal: Soft. Bowel sounds are normal. He exhibits no distension. There is no tenderness. There is no rebound and no guarding.  Psychiatric: His mood appears anxious.  Vitals reviewed.         Assessment & Plan:  Depression - Plan: ARIPiprazole (ABILIFY) 5 MG tablet  I recommended making no changes in his medication. I think patient is doing better. Continue Abilify 5 mg by mouth daily at bedtime. I did recommend that we spread his dose of lorazepam out more evenly throughout the day as I feel the majority of his symptoms in the morning could potentially be withdrawal symptoms. Therefore lorazepam out every 8 hours I recommended physical therapy for I believe his osteoarthritis and muscular weakness in the legs

## 2015-10-09 NOTE — Telephone Encounter (Signed)
Seen in ov today

## 2015-10-19 ENCOUNTER — Emergency Department (HOSPITAL_COMMUNITY): Payer: Commercial Managed Care - HMO

## 2015-10-19 ENCOUNTER — Encounter (HOSPITAL_COMMUNITY): Payer: Self-pay | Admitting: Emergency Medicine

## 2015-10-19 ENCOUNTER — Emergency Department (HOSPITAL_COMMUNITY)
Admission: EM | Admit: 2015-10-19 | Discharge: 2015-10-19 | Disposition: A | Discharge status: Home / Self Care | Payer: Commercial Managed Care - HMO | Attending: Emergency Medicine | Admitting: Emergency Medicine

## 2015-10-19 DIAGNOSIS — R079 Chest pain, unspecified: Secondary | ICD-10-CM | POA: Diagnosis not present

## 2015-10-19 DIAGNOSIS — R0789 Other chest pain: Secondary | ICD-10-CM | POA: Diagnosis present

## 2015-10-19 DIAGNOSIS — I10 Essential (primary) hypertension: Secondary | ICD-10-CM | POA: Insufficient documentation

## 2015-10-19 DIAGNOSIS — Z79899 Other long term (current) drug therapy: Secondary | ICD-10-CM | POA: Diagnosis not present

## 2015-10-19 DIAGNOSIS — I2510 Atherosclerotic heart disease of native coronary artery without angina pectoris: Secondary | ICD-10-CM | POA: Insufficient documentation

## 2015-10-19 DIAGNOSIS — Z96653 Presence of artificial knee joint, bilateral: Secondary | ICD-10-CM | POA: Insufficient documentation

## 2015-10-19 DIAGNOSIS — Z955 Presence of coronary angioplasty implant and graft: Secondary | ICD-10-CM | POA: Insufficient documentation

## 2015-10-19 DIAGNOSIS — R062 Wheezing: Secondary | ICD-10-CM | POA: Diagnosis not present

## 2015-10-19 DIAGNOSIS — R0600 Dyspnea, unspecified: Secondary | ICD-10-CM | POA: Diagnosis not present

## 2015-10-19 DIAGNOSIS — I252 Old myocardial infarction: Secondary | ICD-10-CM | POA: Insufficient documentation

## 2015-10-19 DIAGNOSIS — R0602 Shortness of breath: Secondary | ICD-10-CM | POA: Diagnosis not present

## 2015-10-19 LAB — CBC
HCT: 39.2 % (ref 39.0–52.0)
Hemoglobin: 12.6 g/dL — ABNORMAL LOW (ref 13.0–17.0)
MCH: 30.1 pg (ref 26.0–34.0)
MCHC: 32.1 g/dL (ref 30.0–36.0)
MCV: 93.8 fL (ref 78.0–100.0)
Platelets: 126 10*3/uL — ABNORMAL LOW (ref 150–400)
RBC: 4.18 MIL/uL — ABNORMAL LOW (ref 4.22–5.81)
RDW: 13.4 % (ref 11.5–15.5)
WBC: 4.9 10*3/uL (ref 4.0–10.5)

## 2015-10-19 LAB — BASIC METABOLIC PANEL
Anion gap: 5 (ref 5–15)
BUN: 13 mg/dL (ref 6–20)
CO2: 24 mmol/L (ref 22–32)
Calcium: 9 mg/dL (ref 8.9–10.3)
Chloride: 107 mmol/L (ref 101–111)
Creatinine, Ser: 0.97 mg/dL (ref 0.61–1.24)
GFR calc Af Amer: >60 mL/min (ref >60)
GFR calc non Af Amer: >60 mL/min (ref >60)
Glucose, Bld: 103 mg/dL — ABNORMAL HIGH (ref 65–99)
Potassium: 3.7 mmol/L (ref 3.5–5.1)
Sodium: 136 mmol/L (ref 135–145)

## 2015-10-19 LAB — I-STAT TROPONIN, ED: Troponin i, poc: 0.03 ng/mL (ref 0.00–0.08)

## 2015-10-19 MED ORDER — ALBUTEROL SULFATE HFA 108 (90 BASE) MCG/ACT IN AERS: 1.0000 | INHALATION_SPRAY | Freq: Four times a day (QID) | RESPIRATORY_TRACT | Status: DC | PRN

## 2015-10-19 MED ORDER — IPRATROPIUM-ALBUTEROL 0.5-2.5 (3) MG/3ML IN SOLN
3.0000 mL | Freq: Once | RESPIRATORY_TRACT | Status: AC
  Administered 2015-10-19: 3 mL via RESPIRATORY_TRACT
  Filled 2015-10-19: qty 3

## 2015-10-19 MED ORDER — AEROCHAMBER PLUS W/MASK MISC: Status: DC

## 2015-10-19 NOTE — Discharge Instructions (Signed)
How to Use an Inhaler Proper inhaler technique is very important. Good technique ensures that the medicine reaches the lungs. Poor technique results in depositing the medicine on the tongue and back of the throat rather than in the airways. If you do not use the inhaler with good technique, the medicine will not help you. STEPS TO FOLLOW IF USING AN INHALER WITH AN EXTENSION (SPACER)  Remove the cap from the inhaler.  If you are using the inhaler for the first time, you will need to prime it. Shake the inhaler for 5 seconds and release four puffs into the air, away from your face. Ask your health care provider or pharmacist if you have questions about priming your inhaler.  Shake the inhaler for 5 seconds before each breath in (inhalation).  Place the open end of the spacer onto the mouthpiece of the inhaler.  Position the inhaler so that the top of the canister faces up and the spacer mouthpiece faces you.  Put your index finger on the top of the medicine canister. Your thumb supports the bottom of the inhaler and the spacer.  Breathe out (exhale) normally and as completely as possible.  Immediately after exhaling, place the spacer between your teeth and into your mouth. Close your lips tightly around the spacer.  Press the canister down with your index finger to release the medicine.  At the same time as the canister is pressed, inhale deeply and slowly until your lungs are completely filled. This should take 4-6 seconds. Keep your tongue down and out of the way.  Hold the medicine in your lungs for 5-10 seconds (10 seconds is best). This helps the medicine get into the small airways of your lungs. Exhale.  Repeat inhaling deeply through the spacer mouthpiece. Again hold that breath for up to 10 seconds (10 seconds is best). Exhale slowly. If it is difficult to take this second deep breath through the spacer, breathe normally several times through the spacer. Remove the spacer from your  mouth.  Wait at least 15-30 seconds between puffs. Continue with the above steps until you have taken the number of puffs your health care provider has ordered. Do not use the inhaler more than your health care provider tells you.  Remove the spacer from the inhaler, and place the cap on the inhaler.  Follow the directions from your health care provider or the inhaler insert for cleaning the inhaler and spacer. If you are using different kinds of inhalers, use your quick relief medicine to open the airways 10-15 minutes before using a steroid if instructed to do so by your health care provider. If you are unsure which inhalers to use and the order of using them, ask your health care provider, nurse, or respiratory therapist. If you are using a steroid inhaler, always rinse your mouth with water after your last puff, then gargle and spit out the water. Do not swallow the water. AVOID:  Inhaling before or after starting the spray of medicine. It takes practice to coordinate your breathing with triggering the spray.  Inhaling through the nose (rather than the mouth) when triggering the spray. HOW TO DETERMINE IF YOUR INHALER IS FULL OR NEARLY EMPTY You cannot know when an inhaler is empty by shaking it. A few inhalers are now being made with dose counters. Ask your health care provider for a prescription that has a dose counter if you feel you need that extra help. If your inhaler does not have a counter,  ask your health care provider to help you determine the date you need to refill your inhaler. Write the refill date on a calendar or your inhaler canister. Refill your inhaler 7-10 days before it runs out. Be sure to keep an adequate supply of medicine. This includes making sure it is not expired, and that you have a spare inhaler.  SEEK MEDICAL CARE IF:   Your symptoms are only partially relieved with your inhaler.  You are having trouble using your inhaler.  You have some increase in  phlegm. SEEK IMMEDIATE MEDICAL CARE IF:   You feel little or no relief with your inhalers. You are still wheezing and are feeling shortness of breath or tightness in your chest or both.  You have dizziness, headaches, or a fast heart rate.  You have chills, fever, or night sweats.  You have a noticeable increase in phlegm production, or there is blood in the phlegm. MAKE SURE YOU:   Understand these instructions.  Will watch your condition.  Will get help right away if you are not doing well or get worse.   This information is not intended to replace advice given to you by your health care provider. Make sure you discuss any questions you have with your health care provider.  Nonspecific Chest Pain  Chest pain can be caused by many different conditions. There is always a chance that your pain could be related to something serious, such as a heart attack or a blood clot in your lungs. Chest pain can also be caused by conditions that are not life-threatening. If you have chest pain, it is very important to follow up with your health care provider. CAUSES  Chest pain can be caused by:  Heartburn.  Pneumonia or bronchitis.  Anxiety or stress.  Inflammation around your heart (pericarditis) or lung (pleuritis or pleurisy).  A blood clot in your lung.  A collapsed lung (pneumothorax). It can develop suddenly on its own (spontaneous pneumothorax) or from trauma to the chest.  Shingles infection (varicella-zoster virus).  Heart attack.  Damage to the bones, muscles, and cartilage that make up your chest wall. This can include:  Bruised bones due to injury.  Strained muscles or cartilage due to frequent or repeated coughing or overwork.  Fracture to one or more ribs.  Sore cartilage due to inflammation (costochondritis). RISK FACTORS  Risk factors for chest pain may include:  Activities that increase your risk for trauma or injury to your chest.  Respiratory infections or  conditions that cause frequent coughing.  Medical conditions or overeating that can cause heartburn.  Heart disease or family history of heart disease.  Conditions or health behaviors that increase your risk of developing a blood clot.  Having had chicken pox (varicella zoster). SIGNS AND SYMPTOMS Chest pain can feel like:  Burning or tingling on the surface of your chest or deep in your chest.  Crushing, pressure, aching, or squeezing pain.  Dull or sharp pain that is worse when you move, cough, or take a deep breath.  Pain that is also felt in your back, neck, shoulder, or arm, or pain that spreads to any of these areas. Your chest pain may come and go, or it may stay constant. DIAGNOSIS Lab tests or other studies may be needed to find the cause of your pain. Your health care provider may have you take a test called an ambulatory ECG (electrocardiogram). An ECG records your heartbeat patterns at the time the test is performed. You  may also have other tests, such as:  Transthoracic echocardiogram (TTE). During echocardiography, sound waves are used to create a picture of all of the heart structures and to look at how blood flows through your heart.  Transesophageal echocardiogram (TEE).This is a more advanced imaging test that obtains images from inside your body. It allows your health care provider to see your heart in finer detail.  Cardiac monitoring. This allows your health care provider to monitor your heart rate and rhythm in real time.  Holter monitor. This is a portable device that records your heartbeat and can help to diagnose abnormal heartbeats. It allows your health care provider to track your heart activity for several days, if needed.  Stress tests. These can be done through exercise or by taking medicine that makes your heart beat more quickly.  Blood tests.  Imaging tests. TREATMENT  Your treatment depends on what is causing your chest pain. Treatment may  include:  Medicines. These may include:  Acid blockers for heartburn.  Anti-inflammatory medicine.  Pain medicine for inflammatory conditions.  Antibiotic medicine, if an infection is present.  Medicines to dissolve blood clots.  Medicines to treat coronary artery disease.  Supportive care for conditions that do not require medicines. This may include:  Resting.  Applying heat or cold packs to injured areas.  Limiting activities until pain decreases. HOME CARE INSTRUCTIONS  If you were prescribed an antibiotic medicine, finish it all even if you start to feel better.  Avoid any activities that bring on chest pain.  Do not use any tobacco products, including cigarettes, chewing tobacco, or electronic cigarettes. If you need help quitting, ask your health care provider.  Do not drink alcohol.  Take medicines only as directed by your health care provider.  Keep all follow-up visits as directed by your health care provider. This is important. This includes any further testing if your chest pain does not go away.  If heartburn is the cause for your chest pain, you may be told to keep your head raised (elevated) while sleeping. This reduces the chance that acid will go from your stomach into your esophagus.  Make lifestyle changes as directed by your health care provider. These may include:  Getting regular exercise. Ask your health care provider to suggest some activities that are safe for you.  Eating a heart-healthy diet. A registered dietitian can help you to learn healthy eating options.  Maintaining a healthy weight.  Managing diabetes, if necessary.  Reducing stress. SEEK MEDICAL CARE IF:  Your chest pain does not go away after treatment.  You have a rash with blisters on your chest.  You have a fever. SEEK IMMEDIATE MEDICAL CARE IF:   Your chest pain is worse.  You have an increasing cough, or you cough up blood.  You have severe abdominal pain.  You  have severe weakness.  You faint.  You have chills.  You have sudden, unexplained chest discomfort.  You have sudden, unexplained discomfort in your arms, back, neck, or jaw.  You have shortness of breath at any time.  You suddenly start to sweat, or your skin gets clammy.  You feel nauseous or you vomit.  You suddenly feel light-headed or dizzy.  Your heart begins to beat quickly, or it feels like it is skipping beats. These symptoms may represent a serious problem that is an emergency. Do not wait to see if the symptoms will go away. Get medical help right away. Call your local emergency services (  911 in the [place] Do not drive yourself to the hospital.   This information is not intended to replace advice given to you by your health care provider. Make sure you discuss any questions you have with your health care provider.   Document Released: 12/29/2004 Document Revised: 04/11/2014 Document Reviewed: 10/25/2013 Elsevier Interactive Patient Education [employer].

## 2015-10-19 NOTE — ED Notes (Signed)
Pt states he understands instructions. Home stable with family.

## 2015-10-19 NOTE — ED Notes (Signed)
Pt c/o intermittent left sided chest pain and shortness of breath onset yesterday while at the beach. Pt denies any other symptoms.

## 2015-10-19 NOTE — ED Provider Notes (Signed)
CSN: [id number]     Arrival date & time 10/19/15  9833 History   First MD Initiated Contact with Patient 10/19/15 0945     Chief Complaint  Patient presents with  . Chest Pain     (Consider location/radiation/quality/duration/timing/severity/associated sxs/prior Treatment) HPI Patient reports that he's been getting a discomfort in his central chest/epigastric area for several weeks. He reports that he has feels like he can't get his breath when it comes. He does not have other associated symptoms. Patient reports that he was at the beach over the weekend and really noticed the symptoms there. Nothing particularly seems to make it better or worse. He reports it just seems to sporadically, and go. He reports he chronically has swelling of the left lower extremity. He reports as been that way for years. He has not noticed any change in lower extremity swelling or calf pain. Patient had gone to his family doctor's office this morning and was referred to the emergency department for further evaluation. EMR reviewed indicates the patient had an admission within the last month with cardiology consultation and diagnostic workup negative for cardiac ischemia. Past Medical History  Diagnosis Date  . CAD (coronary artery disease)   . BPH (benign prostatic hyperplasia)   . GERD (gastroesophageal reflux disease)   . Hypertension   . Anxiety   . History of ASCVD     MULTIVESSEL  . 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 Hyperlipidemia   . Hemochromatosis     Possible, elevated iron stores, hook-like osteophytes on hand films, normal LFTs  . Esophageal ulcer without bleeding     bid ppi indefinitely  . Complication of anesthesia     "problems making water afterwards"  . Family history of adverse reaction to anesthesia     "children w/PONV"  . Myocardial infarction ([place]) 1999  . Pneumonia     "several times; got a little now" (09/04/2015)  . Hepatitis      "yellow jaundice as a baby"  . Headache     "couple/week maybe" (09/04/2015)  . Arthritis     "all over"   . DJD (degenerative joint disease)   . Rheumatoid arthritis ([place])   . Osteoarthritis   . Chronic lower back pain   . Depression    Past Surgical History  Procedure Laterality Date  . Cholecystectomy open    . Shoulder open rotator cuff repair Right   . Total knee arthroplasty Bilateral   . Coronary artery bypass graft  1999  . Descending aortic aneurysm repair w/ stent      DE stent ostium into the right radial free graft at OM-, 02-2007  . Esophagogastroduodenoscopy N/A 11/09/2014    Procedure: ESOPHAGOGASTRODUODENOSCOPY (EGD);  Surgeon: [name], MD;  Location: [facility] ENDOSCOPY;  Service: Endoscopy;  Laterality: N/A;  . Inguinal hernia repair    . Joint replacement    . Nasal sinus surgery    . Arm skin lesion biopsy / excision Left 09/02/2015  . Coronary angioplasty with stent placement    . Cataract extraction w/ intraocular lens  implant, bilateral Bilateral   . Corneal transplant Bilateral     "one at [facility]; one at [facility]"   Family History  Problem Relation Age of Onset  . Arthritis-Osteo Sister    Social History  Substance Use Topics  . Smoking status: Never Smoker   . Smokeless tobacco: Former Systems developer    Types: Loss adjuster, chartered  Comment: "quit chewing in the 1960s"  . Alcohol Use: No    Review of Systems  10 Systems reviewed and are negative for acute change except as noted in the HPI.   Allergies  Feldene; Statins; Doxycycline; Tequin; and Valium  Home Medications   Prior to Admission medications   Medication Sig Start Date End Date Taking? Authorizing Provider  ARIPiprazole (ABILIFY) 5 MG tablet Take 1 tablet (5 mg total) by mouth at bedtime. 10/09/15  Yes [name], MD  atenolol (TENORMIN) 50 MG tablet TAKE 1 TABLET BY MOUTH  EVERY DAY 07/14/15  Yes Jettie Booze, MD  clopidogrel (PLAVIX) 75 MG tablet TAKE 1 TABLET BY MOUTH  EVERY DAY 07/14/15  Yes Jettie Booze, MD  diclofenac sodium (VOLTAREN) 1 % GEL Apply 2 g topically daily as needed (for pain in knees and feet).   Yes Historical Provider, MD  FLUoxetine (PROZAC) 20 MG capsule TAKE ONE CAPSULE BY MOUTH DAILY 06/15/15  Yes [name], MD  hydrOXYzine (ATARAX/VISTARIL) 25 MG tablet Take 1 tablet (25 mg total) by mouth every 8 (eight) hours as needed for itching. 08/04/15  Yes [name], MD  leflunomide (ARAVA) 10 MG tablet Take 10 mg by mouth daily.  08/01/15  Yes Historical Provider, MD  LORazepam (ATIVAN) 1 MG tablet TAKE ONE (1) TABLET BY MOUTH 3 TIMES DAILY. TAKE AT 8AM, 12 NOON, AND4PM. Patient taking differently: TAKE ONE (1) TABLET BY MOUTH 3- 4 TIMES DAILY. TAKE AT [place], [place]. 08/18/15  Yes [name], MD  nitroGLYCERIN (NITROSTAT) 0.4 MG SL tablet Place 1 tablet (0.4 mg total) under the tongue every 5 (five) minutes as needed for chest pain. 07/14/15  Yes Jettie Booze, MD  rosuvastatin (CRESTOR) 5 MG tablet TAKE 1 TABLET BY MOUTH  EVERY DAY 07/14/15  Yes Jettie Booze, MD  tamsulosin (FLOMAX) 0.4 MG CAPS capsule TAKE 1 CAPSULE EVERY DAY 06/15/15  Yes [name], MD  traZODone (DESYREL) 50 MG tablet TAKE TWO TABLETS BY MOUTH AT BEDTIME AS NEEDED FOR SLEEP 06/26/15  Yes [name], MD  Lifitegrast Shirley Friar) 5 % SOLN Apply 1 drop to eye 2 (two) times daily. Patient not taking: Reported on 10/09/2015 08/20/15   [name], MD  pantoprazole (PROTONIX) 40 MG tablet Take 1 tablet (40 mg total) by mouth 2 (two) times daily. TAKE 1 TABLET BY MOUTH EVERY DAY Patient not taking: Reported on 10/09/2015 09/10/15   Tawni Millers, MD  saccharomyces boulardii (FLORASTOR) 250 MG capsule Take 1 capsule (250 mg total) by mouth 2 (two) times daily. Patient not taking: Reported on 10/09/2015 09/10/15   Tawni Millers, MD  Vitamin D, Ergocalciferol, (DRISDOL) 50000 units CAPS capsule Take 1 capsule (50,000 Units total) by mouth every 7 (seven)  days. Patient not taking: Reported on 10/19/2015 09/23/15   [name], MD   BP 132/94 mmHg  Pulse 88  Temp(Src) 97.6 F (36.4 C) (Oral)  Resp 16  SpO2 90% Physical Exam  Constitutional: He is oriented to person, place, and time. He appears well-developed and well-nourished.  HENT:  Head: Normocephalic and atraumatic.  Eyes: EOM are normal. Pupils are equal, round, and reactive to light.  Neck: Neck supple.  Cardiovascular: Normal rate, regular rhythm, normal heart sounds and intact distal pulses.   Pulmonary/Chest: Effort normal. He has wheezes.  On auscultation, patient had wheezes concentrated to the center of the chest. Adequate air flow throughout. No rhonchi  or rail.  Abdominal: Soft. Bowel sounds are normal. He exhibits no distension. There is no tenderness.  Musculoskeletal: Normal range of motion. He exhibits edema. He exhibits no tenderness.  Patient has approximately 1+ edema of the left lower extremity. The calf is soft and nontender. Bilateral calves soft and nontender.  Neurological: He is alert and oriented to person, place, and time. He has normal strength. No cranial nerve deficit. He exhibits normal muscle tone. Coordination normal. GCS eye subscore is 4. GCS verbal subscore is 5. GCS motor subscore is 6.  Skin: Skin is warm, dry and intact.  Psychiatric: He has a normal mood and affect.    ED Course  Procedures (including critical care time) Labs Review Labs Reviewed  BASIC METABOLIC PANEL - Abnormal; Notable for the following:    Glucose, Bld 103 (*)    All other components within normal limits  CBC - Abnormal; Notable for the following:    RBC 4.18 (*)    Hemoglobin 12.6 (*)    Platelets 126 (*)    All other components within normal limits  I-STAT TROPOININ, ED    Imaging Review Dg Chest 2 View  10/19/2015  CLINICAL DATA:  82 y/o M; intermittent left-sided chest pain and shortness of breath with onset yesterday. History of hypertension, coronary  artery disease, and mild cardial infarction with CABG 1999. EXAM: CHEST  2 VIEW COMPARISON:  Chest radiograph dated 09/05/2015. Chest CT dated 09/02/2011. FINDINGS: Stable cardiomediastinal silhouette. No consolidation, pneumothorax, or pleural effusion. Stable hazy obscuration of left heart border and left cardiophrenic sulcus corresponding to fat pad on prior CT. Atherosclerotic calcification of the aortic arch. Stable sternotomy wires. Right upper quadrant cholecystectomy clips. No acute bony or articular abnormality. IMPRESSION: No active cardiopulmonary disease. Electronically Signed   By: Kristine Garbe M.D.   On: 10/19/2015 09:34   I have personally reviewed and evaluated these images and lab results as part of my medical decision-making.   EKG Interpretation None      MDM   Final diagnoses:  Dyspnea  Wheeze   At this time, patient's symptoms appear to be of lower probability to be cardiac ischemic. Patient did have extensive cardiac workup during an impatient hospitalization on 6\5\2017. He had cardiac stress test, echocardiogram and cardiology consultation. He was deemed low risk for ischemic disease. Patient is clinically well in appearance and has stable vital signs. On physical examination he did have some localizing wheeze to the central lung fields. I felt periodic bronchospasm may be the symptoms is causing him to feel short of breath. He has been provided with an inhaler to use as needed. Patient reports once before he had similar symptoms and got good results with an inhaler about a year ago. He is advised to follow-up with his family doctor to review response to treatment.   Charlesetta Shanks, MD 10/19/15 [phone]

## 2015-10-19 NOTE — ED Notes (Signed)
Pt states used nitro last night with relief.

## 2015-10-29 DIAGNOSIS — M810 Age-related osteoporosis without current pathological fracture: Secondary | ICD-10-CM | POA: Diagnosis not present

## 2015-10-29 DIAGNOSIS — Z79899 Other long term (current) drug therapy: Secondary | ICD-10-CM | POA: Diagnosis not present

## 2015-10-29 DIAGNOSIS — M2550 Pain in unspecified joint: Secondary | ICD-10-CM | POA: Diagnosis not present

## 2015-10-29 DIAGNOSIS — M353 Polymyalgia rheumatica: Secondary | ICD-10-CM | POA: Diagnosis not present

## 2015-10-29 DIAGNOSIS — M159 Polyosteoarthritis, unspecified: Secondary | ICD-10-CM | POA: Diagnosis not present

## 2015-10-30 ENCOUNTER — Telehealth: Payer: Self-pay | Admitting: Interventional Cardiology

## 2015-10-30 NOTE — Telephone Encounter (Signed)
Error

## 2015-11-02 ENCOUNTER — Ambulatory Visit (INDEPENDENT_AMBULATORY_CARE_PROVIDER_SITE_OTHER): Payer: Commercial Managed Care - HMO | Admitting: Family Medicine

## 2015-11-02 ENCOUNTER — Other Ambulatory Visit: Payer: Self-pay | Admitting: Interventional Cardiology

## 2015-11-02 VITALS — BP 156/70 | HR 60 | Temp 97.7°F | Resp 18 | Ht 68.0 in | Wt 182.0 lb

## 2015-11-02 DIAGNOSIS — R350 Frequency of micturition: Secondary | ICD-10-CM | POA: Diagnosis not present

## 2015-11-02 DIAGNOSIS — N410 Acute prostatitis: Secondary | ICD-10-CM

## 2015-11-02 LAB — URINALYSIS, MICROSCOPIC ONLY
Casts: NONE SEEN [LPF]
Crystals: NONE SEEN [HPF]
Squamous Epithelial / LPF: NONE SEEN [HPF] (ref <=5)
WBC, UA: NONE SEEN WBC/HPF (ref <=5)
Yeast: NONE SEEN [HPF]

## 2015-11-02 LAB — URINALYSIS, ROUTINE W REFLEX MICROSCOPIC
Bilirubin Urine: NEGATIVE
Glucose, UA: NEGATIVE
Ketones, ur: NEGATIVE
Leukocytes, UA: NEGATIVE
Nitrite: NEGATIVE
Protein, ur: NEGATIVE
Specific Gravity, Urine: 1.015 (ref 1.001–1.035)
pH: 6 (ref 5.0–8.0)

## 2015-11-02 MED ORDER — CIPROFLOXACIN HCL 500 MG PO TABS: 500.0000 mg | ORAL_TABLET | Freq: Two times a day (BID) | ORAL | 0 refills | Status: DC

## 2015-11-02 MED ORDER — LORAZEPAM 1 MG PO TABS: ORAL_TABLET | ORAL | 2 refills | Status: DC

## 2015-11-02 NOTE — Progress Notes (Signed)
Subjective:    Patient ID: Evan [name], male    DOB: 03-19-1928, 82 y.o.   MRN: [id number]  HPI Patient reports a 48 hour history of polyuria. He also reports some bladder distention. He reports weak stream. He denies any dysuria or hematuria. He denies any fever or CVA tenderness. He states last night he had to void 12 times  Past Medical History:  Diagnosis Date  . Anginal chest pain at rest [facility])    Chronic non, controlled on Lorazepam  . Anxiety   . Arthritis    "all over"   . BPH (benign prostatic hyperplasia)   . CAD (coronary artery disease)   . Chronic lower back pain   . Complication of anesthesia    "problems making water afterwards"  . Depression   . DJD (degenerative joint disease)   . Esophageal ulcer without bleeding    bid ppi indefinitely  . Family history of adverse reaction to anesthesia    "children w/PONV"  . GERD (gastroesophageal reflux disease)   . Headache    "couple/week maybe" (09/04/2015)  . Hemochromatosis    Possible, elevated iron stores, hook-like osteophytes on hand films, normal LFTs  . Hepatitis    "yellow jaundice as a baby"  . History of ASCVD    MULTIVESSEL  . Hyperlipidemia   . Hypertension   . Myocardial infarction ([place]) 1999  . Osteoarthritis   . Pneumonia    "several times; got a little now" (09/04/2015)  . Rheumatoid arthritis [facility])    Past Surgical History:  Procedure Laterality Date  . ARM SKIN LESION BIOPSY / EXCISION Left 09/02/2015  . CATARACT EXTRACTION W/ INTRAOCULAR LENS  IMPLANT, BILATERAL Bilateral   . CHOLECYSTECTOMY OPEN    . CORNEAL TRANSPLANT Bilateral    "one at [facility]; one at [facility]"  . CORONARY ANGIOPLASTY WITH STENT PLACEMENT    . CORONARY ARTERY BYPASS GRAFT  1999  . DESCENDING AORTIC ANEURYSM REPAIR W/ STENT     DE stent ostium into the right radial free graft at OM-, 02-2007  . ESOPHAGOGASTRODUODENOSCOPY N/A 11/09/2014   Procedure: ESOPHAGOGASTRODUODENOSCOPY (EGD);  Surgeon: [name], MD;  Location: [facility]  ENDOSCOPY;  Service: Endoscopy;  Laterality: N/A;  . INGUINAL HERNIA REPAIR    . JOINT REPLACEMENT    . NASAL SINUS SURGERY    . SHOULDER OPEN ROTATOR CUFF REPAIR Right   . TOTAL KNEE ARTHROPLASTY Bilateral    Current Outpatient Prescriptions on File Prior to Visit  Medication Sig Dispense Refill  . albuterol (PROVENTIL HFA;VENTOLIN HFA) 108 (90 Base) MCG/ACT inhaler Inhale 1-2 puffs into the lungs every 6 (six) hours as needed for wheezing or shortness of breath. 1 Inhaler 0  . ARIPiprazole (ABILIFY) 5 MG tablet Take 1 tablet (5 mg total) by mouth at bedtime. 30 tablet 5  . atenolol (TENORMIN) 50 MG tablet TAKE 1 TABLET BY MOUTH  EVERY DAY 90 tablet 0  . clopidogrel (PLAVIX) 75 MG tablet TAKE 1 TABLET BY MOUTH  EVERY DAY 90 tablet 0  . diclofenac sodium (VOLTAREN) 1 % GEL Apply 2 g topically daily as needed (for pain in knees and feet).    Marland Kitchen FLUoxetine (PROZAC) 20 MG capsule TAKE ONE CAPSULE BY MOUTH DAILY 90 capsule 3  . hydrOXYzine (ATARAX/VISTARIL) 25 MG tablet Take 1 tablet (25 mg total) by mouth every 8 (eight) hours as needed for itching. 60 tablet 1  . leflunomide (ARAVA) 10 MG tablet Take 10 mg by mouth daily.     Marland Kitchen  Lifitegrast (XIIDRA) 5 % SOLN Apply 1 drop to eye 2 (two) times daily. 1 each 3  . LORazepam (ATIVAN) 1 MG tablet TAKE ONE (1) TABLET BY MOUTH 3 TIMES DAILY. TAKE AT 8AM, 12 NOON, AND4PM. (Patient taking differently: TAKE ONE (1) TABLET BY MOUTH 3- 4 TIMES DAILY. TAKE AT 8AM, 12 NOON, AND4PM.) 90 tablet 2  . nitroGLYCERIN (NITROSTAT) 0.4 MG SL tablet Place 1 tablet (0.4 mg total) under the tongue every 5 (five) minutes as needed for chest pain. 25 tablet 0  . pantoprazole (PROTONIX) 40 MG tablet Take 1 tablet (40 mg total) by mouth 2 (two) times daily. TAKE 1 TABLET BY MOUTH EVERY DAY 90 tablet 0  . rosuvastatin (CRESTOR) 5 MG tablet TAKE 1 TABLET BY MOUTH  EVERY DAY 90 tablet 0  . saccharomyces boulardii (FLORASTOR) 250 MG capsule Take 1 capsule (250 mg total) by mouth 2  (two) times daily. 20 capsule 0  . Spacer/Aero-Holding Chambers (AEROCHAMBER PLUS WITH MASK) inhaler Use as instructed 1 each 2  . tamsulosin (FLOMAX) 0.4 MG CAPS capsule TAKE 1 CAPSULE EVERY DAY 90 capsule 3  . traZODone (DESYREL) 50 MG tablet TAKE TWO TABLETS BY MOUTH AT BEDTIME AS NEEDED FOR SLEEP 60 tablet 3  . Vitamin D, Ergocalciferol, (DRISDOL) 50000 units CAPS capsule Take 1 capsule (50,000 Units total) by mouth every 7 (seven) days. 4 capsule 3   No current facility-administered medications on file prior to visit.    Allergies  Allergen Reactions  . Feldene [Piroxicam] Other (See Comments)    REACTION: Blisters  . Statins Other (See Comments)    Myalgias  . Doxycycline Other (See Comments)    REACTION:  unknown  . Tequin Anxiety  . Valium Other (See Comments)    REACTION:  unknown   Social History   Social History  . Marital status: Widowed    Spouse name: N/A  . Number of children: N/A  . Years of education: N/A   Occupational History  . Not on file.   Social History Main Topics  . Smoking status: Never Smoker  . Smokeless tobacco: Former Systems developer    Types: Chew     Comment: "quit chewing in the 1960s"  . Alcohol use No  . Drug use: No  . Sexual activity: Not on file   Other Topics Concern  . Not on file   Social History Narrative  . No narrative on file     Review of Systems  All other systems reviewed and are negative.      Objective:   Physical Exam  Constitutional: He appears well-developed and well-nourished. No distress.  HENT:  Right Ear: External ear normal.  Left Ear: External ear normal.  Nose: Nose normal.  Mouth/Throat: Oropharynx is clear and moist. No oropharyngeal exudate.  Eyes: Conjunctivae are normal.  Neck: Neck supple.  Cardiovascular: Normal rate, regular rhythm and normal heart sounds.   No murmur heard. Pulmonary/Chest: Effort normal and breath sounds normal. No respiratory distress. He has no wheezes. He has no rales.    Abdominal: Soft. Bowel sounds are normal. He exhibits no distension. There is no tenderness. There is no rebound and no guarding.  Genitourinary: Rectal exam shows no mass. Prostate is tender. Prostate is not enlarged.  Musculoskeletal: He exhibits no edema.  Lymphadenopathy:    He has no cervical adenopathy.  Skin: No rash noted. He is not diaphoretic. No erythema.  Vitals reviewed.         Assessment & Plan:  Frequent urination - Plan: Urinalysis, Routine w reflex microscopic (not at [facility])  Prostatitis, acute - Plan: ciprofloxacin (CIPRO) 500 MG tablet  I believe his symptoms are consistent with prostatitis particular given the tender prostate on exam and his history. Begin Cipro 500 mg by mouth twice a day for 10 days. I did refill the patient's lorazepam as he has lost his pill bottle and I see no evidence of abuse or diversion. Therefore I will refill the medication early

## 2015-11-03 ENCOUNTER — Ambulatory Visit: Payer: Medicare HMO | Admitting: Interventional Cardiology

## 2015-11-03 ENCOUNTER — Encounter: Payer: Self-pay | Admitting: Interventional Cardiology

## 2015-11-03 ENCOUNTER — Ambulatory Visit (INDEPENDENT_AMBULATORY_CARE_PROVIDER_SITE_OTHER): Payer: Commercial Managed Care - HMO | Admitting: Interventional Cardiology

## 2015-11-03 VITALS — BP 80/50 | HR 60 | Ht 68.0 in | Wt 181.1 lb

## 2015-11-03 DIAGNOSIS — I2510 Atherosclerotic heart disease of native coronary artery without angina pectoris: Secondary | ICD-10-CM

## 2015-11-03 DIAGNOSIS — Z951 Presence of aortocoronary bypass graft: Secondary | ICD-10-CM

## 2015-11-03 DIAGNOSIS — R600 Localized edema: Secondary | ICD-10-CM

## 2015-11-03 DIAGNOSIS — I951 Orthostatic hypotension: Secondary | ICD-10-CM

## 2015-11-03 DIAGNOSIS — I10 Essential (primary) hypertension: Secondary | ICD-10-CM

## 2015-11-03 MED ORDER — ATENOLOL 50 MG PO TABS: ORAL_TABLET | ORAL | 3 refills | Status: DC

## 2015-11-03 NOTE — Progress Notes (Signed)
Patient ID: Evan [name], male   DOB: 1927-06-30, 82 y.o.   MRN: [id number]    [place], [address], [place]  [address] Phone: [phone] Fax:  [phone]  Date:  11/03/2015   ID:  Evan [name], DOB 26-Dec-1927, MRN [id number]  PCP:  Odette Fraction, MD      History of Present Illness: Evan [name] is a 82 y.o. male with CAD. CABG in 1998, stent in 2008. He had a normal stress test in 2012. He does not do much walking because of arthritis. He has to go up and down stairs in his house multiple times a day. No problems with this.   Was in the hospital in June 2017 for a cellulitis.  He had a negative stress test.  He reports being shaky if he does not take his lorazepam on schedule.  No Pain like what he had prior to CABG.   Denies dizziness, syncope, SHOB, orthopnea, PND.  Mild LE edema. No bleeding.  Feels tired.  States that BP has not been checked.  Does not check at home. No falls. Climbs (10-11 steps) steps about 8 to 10 times daily and walks to the mailbox daily w/o [facility] or chest pain.    Dizziness while getting up from sitting position resolved.  Feels fatigued.  BP low today.  He has not seen any other readings like this.      Wt Readings from Last 3 Encounters:  11/03/15 181 lb 1.6 oz (82.1 kg)  11/02/15 182 lb (82.6 kg)  10/09/15 179 lb (81.2 kg)     Past Medical History:  Diagnosis Date  . Anginal chest pain at rest [facility])    Chronic non, controlled on Lorazepam  . Anxiety   . Arthritis    "all over"   . BPH (benign prostatic hyperplasia)   . CAD (coronary artery disease)   . Chronic lower back pain   . Complication of anesthesia    "problems making water afterwards"  . Depression   . DJD (degenerative joint disease)   . Esophageal ulcer without bleeding    bid ppi indefinitely  . Family history of adverse reaction to anesthesia    "children w/PONV"  . GERD (gastroesophageal reflux disease)   . Headache    "couple/week maybe" (09/04/2015)  .  Hemochromatosis    Possible, elevated iron stores, hook-like osteophytes on hand films, normal LFTs  . Hepatitis    "yellow jaundice as a baby"  . History of ASCVD    MULTIVESSEL  . Hyperlipidemia   . Hypertension   . Myocardial infarction ([place]) 1999  . Osteoarthritis   . Pneumonia    "several times; got a little now" (09/04/2015)  . Rheumatoid arthritis ([place])     Current Outpatient Prescriptions  Medication Sig Dispense Refill  . albuterol (PROVENTIL HFA;VENTOLIN HFA) 108 (90 Base) MCG/ACT inhaler Inhale 1-2 puffs into the lungs every 6 (six) hours as needed for wheezing or shortness of breath. 1 Inhaler 0  . ARIPiprazole (ABILIFY) 5 MG tablet Take 1 tablet (5 mg total) by mouth at bedtime. 30 tablet 5  . atenolol (TENORMIN) 50 MG tablet TAKE 1 TABLET BY MOUTH  EVERY DAY 90 tablet 0  . ciprofloxacin (CIPRO) 500 MG tablet Take 1 tablet (500 mg total) by mouth 2 (two) times daily. 20 tablet 0  . clopidogrel (PLAVIX) 75 MG tablet Take 1 tablet (75 mg total) by mouth daily. 90 tablet 2  . diclofenac sodium (  VOLTAREN) 1 % GEL Apply 2 g topically daily as needed (for pain in knees and feet).    Marland Kitchen FLUoxetine (PROZAC) 20 MG capsule TAKE ONE CAPSULE BY MOUTH DAILY 90 capsule 3  . leflunomide (ARAVA) 10 MG tablet Take 10 mg by mouth daily.     Marland Kitchen Lifitegrast (XIIDRA) 5 % SOLN Apply 1 drop to eye 2 (two) times daily. 1 each 3  . LORazepam (ATIVAN) 1 MG tablet TAKE ONE (1) TABLET BY MOUTH 3 TIMES DAILY. TAKE AT 8AM, 12 NOON, AND4PM. 90 tablet 2  . nitroGLYCERIN (NITROSTAT) 0.4 MG SL tablet Place 1 tablet (0.4 mg total) under the tongue every 5 (five) minutes as needed for chest pain. 25 tablet 0  . pantoprazole (PROTONIX) 40 MG tablet Take 1 tablet (40 mg total) by mouth daily. 90 tablet 2  . rosuvastatin (CRESTOR) 5 MG tablet Take 1 tablet (5 mg total) by mouth daily. 90 tablet 2  . saccharomyces boulardii (FLORASTOR) 250 MG capsule Take 1 capsule (250 mg total) by mouth 2 (two) times daily. 20  capsule 0  . Spacer/Aero-Holding Chambers (AEROCHAMBER PLUS WITH MASK) inhaler Use as instructed 1 each 2  . tamsulosin (FLOMAX) 0.4 MG CAPS capsule Take 0.4 mg by mouth daily after supper.    . traMADol (ULTRAM) 50 MG tablet Take 1 tablet by mouth as needed for pain.    . traZODone (DESYREL) 50 MG tablet TAKE TWO TABLETS BY MOUTH AT BEDTIME AS NEEDED FOR SLEEP 60 tablet 3  . Vitamin D, Ergocalciferol, (DRISDOL) 50000 units CAPS capsule Take 1 capsule (50,000 Units total) by mouth every 7 (seven) days. 4 capsule 3  . HYDROcodone-acetaminophen (NORCO/VICODIN) 5-325 MG tablet Take 1 tablet by mouth as needed for pain.    Marland Kitchen temazepam (RESTORIL) 30 MG capsule Take 30 mg by mouth as needed for sleep.     No current facility-administered medications for this visit.     Allergies:    Allergies  Allergen Reactions  . Feldene [Piroxicam] Other (See Comments)    REACTION: Blisters  . Statins Other (See Comments)    Myalgias  . Doxycycline Other (See Comments)    REACTION:  unknown  . Tequin Anxiety  . Valium Other (See Comments)    REACTION:  unknown    Social History:  The patient  reports that he has never smoked. He has quit using smokeless tobacco. His smokeless tobacco use included Chew. He reports that he does not drink alcohol or use drugs.   Family History:  The patient's family history includes Arthritis-Osteo in his sister; Heart attack in his brother and other.   ROS:  Please see the history of present illness.  No nausea, vomiting.  No fevers, chills.  No focal weakness.  No dysuria. Atypical chest pain as described above. Joint pain.   All other systems reviewed and negative.   PHYSICAL EXAM: VS:  BP (!) 80/50   [name] 60   Ht '5\' 8"'$  (1.727 m)   Wt 181 lb 1.6 oz (82.1 kg)   SpO2 97% Comment: RA  BMI 27.54 kg/m  Well nourished, well developed, in no acute distress  HEENT: normal  Neck: no JVD , no carotid bruits Cardiac:  normal S1, S2; RRR;   Lungs:  clear to auscultation  bilaterally, no wheezing, rhonchi or rales  Abd: soft, nontender, no hepatomegaly  Ext: tr-1+ edema Bilaterally in lower extremities Skin: warm and dry  Neuro:   no focal abnormalities noted Psych: anxious affect  EKG:  Sinus bradycardia, NSST     ASSESSMENT AND PLAN:  1. Coronary atherosclerosis of native coronary artery  Refill Nitroglycerin 0.4 mg tablet, 0.4 mg, 1 tablet as directed, SL, as directed prn chest pain; Continue clopidogrel.  No bleeding issues.  Notes: He had a negative stress test in 2012 and 2017. He has been under a lot of stress due to his wife's Alzheimers condition.  Wife just moved to a nursing hime.  No angina. 2. Hypercholesteremia, pure  Continue Crestor Tablet, 5 MG, 1 tab, Orally, Once a day .  LDL 39 in 8/14.  HDL 41.  Check lipids in 8/15.  Obtain most recent lipids from Dr. Samella Parr office.  3. Essential hypertension, benign / orthostatic hypotension  Ramipril stopped.  BP on the low side today.  Was high yesterday.  He will get a cuff at home and call us if there are any readings outside of the 81-275 systolic range.   Refill atenolol 50 mg daily.  Hold dose tomorrow and then take a dose on Thursday.  Blood pressure low today. 92/60 on recheck. No syncope. Blood pressure may increase as his prostate infection is treated. Some of the fatigue and low blood pressure may be related to high dose of benzodiazepine. He is taking lorazepam 3 times a day. If he misses a dose, he shakes.  He will check readings at home and let us know. 4. Insomnia: he has trouble sleeping- wakes up early.  Explained to him that this does happen as people get older.  Now worse with prostate trouble. 5. Edema: elevate legs to help with edema.  Don't think he could do compression stockings.  Given his prior issues with low blood pressure, would not want to add diuretic. He does have a Tempur-Pedic mattress that can be adjusted with remote control. Elevating his legs will not be a problem.      Signed, Mina Marble, MD, [facility] 11/03/2015 2:14 PM

## 2015-11-03 NOTE — Patient Instructions (Signed)
**Note De-Identified  Obfuscation** Medication Instructions:  Same-no changes Do not take Atenolol tomorrow (Start taking on Thursday)   Labwork: None  Testing/Procedures: None  Follow-Up: Your physician wants you to follow-up in: 1 year. You will receive a reminder letter in the mail two months in advance. If you don't receive a letter, please call our office to schedule the follow-up appointment.     If you need a refill on your cardiac medications before your next appointment, please call your pharmacy.

## 2015-11-09 ENCOUNTER — Ambulatory Visit (INDEPENDENT_AMBULATORY_CARE_PROVIDER_SITE_OTHER): Payer: Commercial Managed Care - HMO | Admitting: Family Medicine

## 2015-11-09 ENCOUNTER — Encounter: Payer: Self-pay | Admitting: Family Medicine

## 2015-11-09 VITALS — BP 140/76 | HR 60 | Temp 97.9°F | Resp 20 | Ht 68.0 in | Wt 181.0 lb

## 2015-11-09 DIAGNOSIS — R300 Dysuria: Secondary | ICD-10-CM

## 2015-11-09 DIAGNOSIS — R0600 Dyspnea, unspecified: Secondary | ICD-10-CM

## 2015-11-09 DIAGNOSIS — R0609 Other forms of dyspnea: Secondary | ICD-10-CM | POA: Diagnosis not present

## 2015-11-09 DIAGNOSIS — J4520 Mild intermittent asthma, uncomplicated: Secondary | ICD-10-CM

## 2015-11-09 DIAGNOSIS — F419 Anxiety disorder, unspecified: Secondary | ICD-10-CM | POA: Diagnosis not present

## 2015-11-09 MED ORDER — ALBUTEROL SULFATE HFA 108 (90 BASE) MCG/ACT IN AERS: 1.0000 | INHALATION_SPRAY | Freq: Four times a day (QID) | RESPIRATORY_TRACT | 1 refills | Status: DC | PRN

## 2015-11-09 NOTE — Assessment & Plan Note (Signed)
He has not seen pulmonary in quite some time, family would like re-evaluation After discussing with his PCP this is an ongoing complaint of shortness of breath dyspnea with exertion he has been tried on inhalers in the past for asthma with no improvement. Since he has a lot of underlying anxiety which they've been working to try to improve his anxiety/depression and  the family still does not seem happy with his medications. I think he would also benefit from psychiatry evaluation. We will get him set with a pulmonologist though I think that his shortness of breath is multifactorial he has history of CABG she is 82 years old deconditioned. I did refill his albuterol inhaler which seems to help some days.  With regards to the urinary frequency we will send a culture off. His urinalysis looks better today than last week he also has known BPH which she started on Flomax and would not change this as he ready has hypertension

## 2015-11-09 NOTE — Progress Notes (Signed)
Subjective:    Patient ID: Evan [name], male    DOB: 05-29-1927, 82 y.o.   MRN: [id number]  Patient presents for Illness (x1 week- SOB- requesting CXR) and Urinary Frequency (x1 day- reports increased frequency- is currently taking Cipro for prostitis ) Patient here with multiple concerns. He is a frequent flyer in the clinic often just comes and randomly with different somatic complaints. He also complains of shortness of breath which we have not found any particular cause for with the exception of his age and the fact that he does have some atherosclerosis. He has history of asthma list on the chart yet albuterol inhaler but he is now out of this. His family would like for him to see a lung doctor. They also noted is a component of anxiety he is on multiple antidepressant medications that I cannot verify if he does not have the bottles with him today. They're concerned about his medications and will likely him to see a psychiatrist as well.  He was seen last week for urinary frequency he was started on Cipro for possible prostatitis he still has 2-1/2 days of medication left he is on Flomax at bedtime He states last night he had to urinate a lot but then his daughter states that he ate a lot of watermelon as well. He denies any burning with urination no change in his bowels no fever no abdominal pain  Reviewed cardiology note from  Last week as well  Review Of Systems:  GEN- denies fatigue, fever, weight loss,weakness, recent illness HEENT- denies eye drainage, change in vision, nasal discharge, CVS- denies chest pain, palpitations RESP- denies SOB, cough, wheeze ABD- denies N/V, change in stools, abd pain GU- denies dysuria, hematuria, dribbling, incontinence MSK- denies joint pain, muscle aches, injury Neuro- denies headache, dizziness, syncope, seizure activity       Objective:    BP 140/76   Pulse 60   Temp 97.9 F (36.6 C) (Oral)   Resp 20   Ht '5\' 8"'$  (1.727 m)   Wt 181 lb  (82.1 kg)   SpO2 97% Comment: RA  BMI 27.52 kg/m  GEN- NAD, alert and oriented x3 HEENT- PERRL, EOMI, non injected sclera, pink conjunctiva, MMM, oropharynx clear Neck- Supple, no LAD, no JVD  CVS- RRR, no murmur RESP-CTAB ABD-NABS,soft,NT,ND, no CVA tenderess  Psych- normal affect and mood  EXT- trace  edema Pulses- Radial, DP- 2+        Assessment & Plan:      Problem List Items Addressed This Visit    Asthma, chronic    He has not seen pulmonary in quite some time, family would like re-evaluation After discussing with his PCP this is an ongoing complaint of shortness of breath dyspnea with exertion he has been tried on inhalers in the past for asthma with no improvement. Since he has a lot of underlying anxiety which they've been working to try to improve his anxiety/depression and  the family still does not seem happy with his medications. I think he would also benefit from psychiatry evaluation. We will get him set with a pulmonologist though I think that his shortness of breath is multifactorial he has history of CABG she is 82 years old deconditioned. I did refill his albuterol inhaler which seems to help some days.  With regards to the urinary frequency we will send a culture off. His urinalysis looks better today than last week he also has known BPH which she started on Flomax  and would not change this as he ready has hypertension      Relevant Medications   albuterol (PROVENTIL HFA;VENTOLIN HFA) 108 (90 Base) MCG/ACT inhaler   Anxiety    Other Visit Diagnoses    Dysuria    -  Primary   Relevant Orders   Urinalysis, Routine w reflex microscopic (not at [facility])   Urine culture      Note: This dictation was prepared with Dragon dictation along with smaller phrase technology. Any transcriptional errors that result from this process are unintentional.

## 2015-11-09 NOTE — Patient Instructions (Signed)
Referral to pulmonary  Finish antibiotics  Check and make sure flomax in box  Culture sent  F/U  [name]

## 2015-11-10 LAB — URINALYSIS, ROUTINE W REFLEX MICROSCOPIC
Bilirubin Urine: NEGATIVE
Glucose, UA: NEGATIVE
Hgb urine dipstick: NEGATIVE
Ketones, ur: NEGATIVE
Leukocytes, UA: NEGATIVE
Nitrite: NEGATIVE
Protein, ur: NEGATIVE
Specific Gravity, Urine: 1.01 (ref 1.001–1.035)
pH: 7 (ref 5.0–8.0)

## 2015-11-10 LAB — URINE CULTURE: Organism ID, Bacteria: NO GROWTH

## 2015-11-11 ENCOUNTER — Telehealth: Payer: Self-pay | Admitting: Family Medicine

## 2015-11-11 NOTE — Telephone Encounter (Signed)
[facility] referral for Pulmonary Dr Cristela Felt # [id number] for 6 visits 12/03/15 - 05/31/16 Dx J45.20 and R06.09

## 2015-11-12 DIAGNOSIS — M25569 Pain in unspecified knee: Secondary | ICD-10-CM | POA: Diagnosis not present

## 2015-11-12 DIAGNOSIS — M79672 Pain in left foot: Secondary | ICD-10-CM | POA: Diagnosis not present

## 2015-11-12 DIAGNOSIS — M2550 Pain in unspecified joint: Secondary | ICD-10-CM | POA: Diagnosis not present

## 2015-11-12 DIAGNOSIS — M79671 Pain in right foot: Secondary | ICD-10-CM | POA: Diagnosis not present

## 2015-11-12 DIAGNOSIS — M25562 Pain in left knee: Secondary | ICD-10-CM | POA: Diagnosis not present

## 2015-11-12 DIAGNOSIS — M159 Polyosteoarthritis, unspecified: Secondary | ICD-10-CM | POA: Diagnosis not present

## 2015-11-12 DIAGNOSIS — M810 Age-related osteoporosis without current pathological fracture: Secondary | ICD-10-CM | POA: Diagnosis not present

## 2015-11-12 DIAGNOSIS — M25561 Pain in right knee: Secondary | ICD-10-CM | POA: Diagnosis not present

## 2015-11-12 DIAGNOSIS — M353 Polymyalgia rheumatica: Secondary | ICD-10-CM | POA: Diagnosis not present

## 2015-11-13 ENCOUNTER — Ambulatory Visit (INDEPENDENT_AMBULATORY_CARE_PROVIDER_SITE_OTHER): Payer: Commercial Managed Care - HMO | Admitting: Family Medicine

## 2015-11-13 VITALS — BP 100/70 | HR 100 | Temp 97.6°F | Resp 18 | Ht 68.0 in | Wt 182.0 lb

## 2015-11-13 DIAGNOSIS — R531 Weakness: Secondary | ICD-10-CM | POA: Diagnosis not present

## 2015-11-13 NOTE — Progress Notes (Signed)
Subjective:    Patient ID: Evan [name], male    DOB: [birth year], 82 y.o.   MRN: [id number]  HPI Patient has presented multiple times over the last few weeks with varying somatic complaints. Today he presents with episodic weakness. He states that over the last 2-3 weeks, he will occasionally feel extremely weak. He will feel like he needs to sit down and rest. Symptoms last approximately one hour and then resolve spontaneously. He denies any palpitations. He denies any syncope or presyncope. He denies any chest pain. He denies any significant shortness of breath or dyspnea. He has had a cough recently but is nonproductive. He reports mucus in his airways. He denies any fevers or chills. He denies any hemoptysis. He denies any pleurisy. He denies any strokelike symptoms or TIA symptoms. He denies any symptoms of serious illness. Past Medical History:  Diagnosis Date  . Anginal chest pain at rest [facility])    Chronic non, controlled on Lorazepam  . Anxiety   . Arthritis    "all over"   . BPH (benign prostatic hyperplasia)   . CAD (coronary artery disease)   . Chronic lower back pain   . Complication of anesthesia    "problems making water afterwards"  . Depression   . DJD (degenerative joint disease)   . Esophageal ulcer without bleeding    bid ppi indefinitely  . Family history of adverse reaction to anesthesia    "children w/PONV"  . GERD (gastroesophageal reflux disease)   . Headache    "couple/week maybe" (09/04/2015)  . Hemochromatosis    Possible, elevated iron stores, hook-like osteophytes on hand films, normal LFTs  . Hepatitis    "yellow jaundice as a baby"  . History of ASCVD    MULTIVESSEL  . Hyperlipidemia   . Hypertension   . Myocardial infarction ([place]) 1999  . Osteoarthritis   . Pneumonia    "several times; got a little now" (09/04/2015)  . Rheumatoid arthritis [facility])    Past Surgical History:  Procedure Laterality Date  . ARM SKIN LESION BIOPSY / EXCISION Left  09/02/2015  . CATARACT EXTRACTION W/ INTRAOCULAR LENS  IMPLANT, BILATERAL Bilateral   . CHOLECYSTECTOMY OPEN    . CORNEAL TRANSPLANT Bilateral    "one at [facility]; one at [facility]"  . CORONARY ANGIOPLASTY WITH STENT PLACEMENT    . CORONARY ARTERY BYPASS GRAFT  1999  . DESCENDING AORTIC ANEURYSM REPAIR W/ STENT     DE stent ostium into the right radial free graft at OM-, 02-2007  . ESOPHAGOGASTRODUODENOSCOPY N/A 11/09/2014   Procedure: ESOPHAGOGASTRODUODENOSCOPY (EGD);  Surgeon: [name], MD;  Location: [facility] ENDOSCOPY;  Service: Endoscopy;  Laterality: N/A;  . INGUINAL HERNIA REPAIR    . JOINT REPLACEMENT    . NASAL SINUS SURGERY    . SHOULDER OPEN ROTATOR CUFF REPAIR Right   . TOTAL KNEE ARTHROPLASTY Bilateral    Current Outpatient Prescriptions on File Prior to Visit  Medication Sig Dispense Refill  . ARIPiprazole (ABILIFY) 5 MG tablet Take 1 tablet (5 mg total) by mouth at bedtime. 30 tablet 5  . atenolol (TENORMIN) 50 MG tablet TAKE 1 TABLET BY MOUTH  EVERY DAY 90 tablet 3  . clopidogrel (PLAVIX) 75 MG tablet Take 1 tablet (75 mg total) by mouth daily. 90 tablet 2  . diclofenac sodium (VOLTAREN) 1 % GEL Apply 2 g topically daily as needed (for pain in knees and feet).    Marland Kitchen FLUoxetine (PROZAC) 20 MG capsule TAKE ONE CAPSULE  BY MOUTH DAILY 90 capsule 3  . HYDROcodone-acetaminophen (NORCO/VICODIN) 5-325 MG tablet Take 1 tablet by mouth as needed for pain.    Marland Kitchen leflunomide (ARAVA) 10 MG tablet Take 10 mg by mouth daily.     Marland Kitchen Lifitegrast (XIIDRA) 5 % SOLN Apply 1 drop to eye 2 (two) times daily. 1 each 3  . LORazepam (ATIVAN) 1 MG tablet TAKE ONE (1) TABLET BY MOUTH 3 TIMES DAILY. TAKE AT 8AM, 12 NOON, AND4PM. 90 tablet 2  . nitroGLYCERIN (NITROSTAT) 0.4 MG SL tablet Place 1 tablet (0.4 mg total) under the tongue every 5 (five) minutes as needed for chest pain. 25 tablet 0  . pantoprazole (PROTONIX) 40 MG tablet Take 1 tablet (40 mg total) by mouth daily. 90 tablet 2  . rosuvastatin (CRESTOR) 5 MG  tablet Take 1 tablet (5 mg total) by mouth daily. 90 tablet 2  . Spacer/Aero-Holding Chambers (AEROCHAMBER PLUS WITH MASK) inhaler Use as instructed 1 each 2  . tamsulosin (FLOMAX) 0.4 MG CAPS capsule Take 0.4 mg by mouth daily after supper.    . traMADol (ULTRAM) 50 MG tablet Take 1 tablet by mouth as needed for pain.    . traZODone (DESYREL) 50 MG tablet TAKE TWO TABLETS BY MOUTH AT BEDTIME AS NEEDED FOR SLEEP 60 tablet 3  . albuterol (PROVENTIL HFA;VENTOLIN HFA) 108 (90 Base) MCG/ACT inhaler Inhale 1-2 puffs into the lungs every 6 (six) hours as needed for wheezing or shortness of breath. 1 Inhaler 1   No current facility-administered medications on file prior to visit.    Allergies  Allergen Reactions  . Feldene [Piroxicam] Other (See Comments)    REACTION: Blisters  . Statins Other (See Comments)    Myalgias  . Doxycycline Other (See Comments)    REACTION:  unknown  . Tequin Anxiety  . Valium Other (See Comments)    REACTION:  unknown   Social History   Social History  . Marital status: Widowed    Spouse name: N/A  . Number of children: N/A  . Years of education: N/A   Occupational History  . Not on file.   Social History Main Topics  . Smoking status: Never Smoker  . Smokeless tobacco: Former Systems developer    Types: Chew     Comment: "quit chewing in the 1960s"  . Alcohol use No  . Drug use: No  . Sexual activity: Not on file   Other Topics Concern  . Not on file   Social History Narrative  . No narrative on file      Review of Systems  All other systems reviewed and are negative.      Objective:   Physical Exam  Constitutional: He is oriented to person, place, and time. He appears well-developed and well-nourished. No distress.  HENT:  Right Ear: External ear normal.  Left Ear: External ear normal.  Nose: Nose normal.  Mouth/Throat: Oropharynx is clear and moist. No oropharyngeal exudate.  Eyes: Conjunctivae and EOM are normal. Pupils are equal, round, and  reactive to light.  Neck: Neck supple. No JVD present.  Cardiovascular: Normal rate, regular rhythm and normal heart sounds.  Exam reveals no gallop and no friction rub.   No murmur heard. Pulmonary/Chest: Effort normal and breath sounds normal. No respiratory distress. He has no wheezes. He has no rales. He exhibits no tenderness.  Abdominal: Soft. Bowel sounds are normal. He exhibits no distension and no mass. There is no tenderness. There is no rebound and no guarding.  Musculoskeletal: He exhibits no edema.  Lymphadenopathy:    He has no cervical adenopathy.  Neurological: He is alert and oriented to person, place, and time. No cranial nerve deficit. He exhibits normal muscle tone. Coordination normal.  Skin: He is not diaphoretic.  Vitals reviewed.         Assessment & Plan:  Weakness  Patient's physical exam is completely normal. I reassured the patient that I do not see anything abnormal on his exam. I believe a lot of his symptoms are due to anxiety and somatization,  he denies any symptoms of life-threatening illness, cardiac arrhythmia, TIA, or unstable angina. Therefore I recommended that we monitor his situation clinically in case his symptoms change or worsen in any way but I do not feel any further workup is necessary at this time

## 2015-11-20 ENCOUNTER — Encounter: Payer: Self-pay | Admitting: Family Medicine

## 2015-11-20 ENCOUNTER — Ambulatory Visit (INDEPENDENT_AMBULATORY_CARE_PROVIDER_SITE_OTHER): Payer: Commercial Managed Care - HMO | Admitting: Family Medicine

## 2015-11-20 ENCOUNTER — Other Ambulatory Visit: Payer: Self-pay | Admitting: Family Medicine

## 2015-11-20 VITALS — BP 152/78 | HR 92 | Temp 98.0°F | Resp 16 | Ht 68.0 in | Wt 175.0 lb

## 2015-11-20 DIAGNOSIS — I10 Essential (primary) hypertension: Secondary | ICD-10-CM | POA: Diagnosis not present

## 2015-11-20 DIAGNOSIS — F411 Generalized anxiety disorder: Secondary | ICD-10-CM

## 2015-11-20 DIAGNOSIS — R5382 Chronic fatigue, unspecified: Secondary | ICD-10-CM

## 2015-11-20 MED ORDER — ATENOLOL 50 MG PO TABS: ORAL_TABLET | ORAL | 1 refills | Status: DC

## 2015-11-20 MED ORDER — CLONIDINE HCL 0.1 MG PO TABS
0.1000 mg | ORAL_TABLET | Freq: Once | ORAL | Status: AC
  Administered 2015-11-20: 0.1 mg via ORAL

## 2015-11-20 NOTE — Patient Instructions (Addendum)
Take medications as prescribed Take atenolol starting tomorrow Take prozac every day  Take ativan three times a day  F/U [name]  2 weeks for blood pressure

## 2015-11-20 NOTE — Progress Notes (Signed)
Subjective:    Patient ID: Evan [name], male    DOB: 08-31-1927, 82 y.o.   MRN: [id number]  Patient presents for Fatigue (reports feeling weak and shaky- states that it got worse as the day progressed) Patient here today with a new caregiver named Evan [name]. He states he's been shaky and weak all over started yesterday. Please refer to previous primary care provider notes he is typically in our office 2 or 3 times a month with the same symptoms. I discussed his care with his primary care provider after my last visit. He has been referred to psychiatry he has an appointment at the end of the month he's also been referred to pulmonary that he is not complaining of any dyspnea nail. That appointment is also at the end of the month. His medications were brought in by his caregiver he has very old bottles back 2015 -2016 multiple bottles of Plavix he had not been taking his atenolol though after I spoke with his daughter Evan [name]) who does his medication she states that sometimes she will put his medicine in all bottles but that does not correlate with th fact  he has multiple of the bottles. She also states that he is taking his Prozac however his medication dispenser box has random doses missed throughout the entire week. He did not have his lorazepam with him which he takes 3 times a day he states that he does not know where it is. We gave an early refill on this a couple weeks ago when he stated he lost it.  There is significant concern on how he is taking his medications and if he is even taking them regularly. I think this is contributing to all of his symptoms and his multiple office visits. Also also with his health in general and his age of think he is becoming more fearful and causing more anxiety symptoms   Review Of Systems:  GEN- + fatigue, fever, weight loss,weakness, recent illness HEENT- denies eye drainage, change in vision, nasal discharge, CVS- denies chest pain, palpitations RESP-  denies SOB, cough, wheeze ABD- denies N/V, change in stools, abd pain GU- denies dysuria, hematuria, dribbling, incontinence MSK- denies joint pain, muscle aches, injury Neuro- denies headache, dizziness, syncope, seizure activity       Objective:    BP (!) 152/78   Pulse 92   Temp 98 F (36.7 C) (Oral)   Resp 16   Ht '5\' 8"'$  (1.727 m)   Wt 175 lb (79.4 kg)   BMI 26.61 kg/m  GEN- NAD, alert and oriented x3 HEENT- PERRL, EOMI, non injected sclera, pink conjunctiva, MMM, oropharynx clear CVS- RRR, no murmur RESP-CTAB ABD-NABS,soft,NT,ND Psych- anxious appearing, mild shaking generalized, not depressed appearing,no SI, speaking normal sentences EXT- trace edema Pulses- Radial  2+        Assessment & Plan:      Problem List Items Addressed This Visit    GAD (generalized anxiety disorder)   Relevant Orders   Drugs of abuse scrn w alc, routine urine   Essential hypertension - Primary    Blood Pressure was significantly elevated on multiple checks. I think some of this was due to his anxiety as well. I gave him clonidine 0.1 mg blood pressure came down to 150/78 he also seemed much more calm. He did not have any shaking by the time he left the office. He will restart his atenolol He is also significant concern over his anxiety and treatment. His daughter  feels like he is on too much medication but I query the fact that he is even taking his Prozac the Abilify or his lorazepam daily since it seems to become missing. I did do urine drug screen on him today. Unfortunately because he received 180 tablets when the past 4 weeks he cannot get another fill his medication therefore the family is going to go to the home insert for his lorazepam. Is possible that he is going into withdrawal since he has not been taking this regularly and he's been on lorazepam multiple times a day for many years. I agree with psychiatry appointment which she will have the end of the month.      Relevant  Medications   cloNIDine (CATAPRES) tablet 0.1 mg (Completed)   atenolol (TENORMIN) 50 MG tablet   Other Relevant Orders   CBC with Differential/Platelet (Completed)   Comprehensive metabolic panel (Completed)   Chronic fatigue    Other Visit Diagnoses   None.     Note: This dictation was prepared with Dragon dictation along with smaller phrase technology. Any transcriptional errors that result from this process are unintentional.

## 2015-11-21 LAB — COMPREHENSIVE METABOLIC PANEL
ALT: 11 U/L (ref 9–46)
AST: 23 U/L (ref 10–35)
Albumin: 4 g/dL (ref 3.6–5.1)
Alkaline Phosphatase: 73 U/L (ref 40–115)
BUN: 17 mg/dL (ref 7–25)
CO2: 18 mmol/L — ABNORMAL LOW (ref 20–31)
Calcium: 8.9 mg/dL (ref 8.6–10.3)
Chloride: 106 mmol/L (ref 98–110)
Creat: 1.11 mg/dL (ref 0.70–1.11)
Glucose, Bld: 105 mg/dL — ABNORMAL HIGH (ref 70–99)
Potassium: 4 mmol/L (ref 3.5–5.3)
Sodium: 136 mmol/L (ref 135–146)
Total Bilirubin: 0.7 mg/dL (ref 0.2–1.2)
Total Protein: 6.3 g/dL (ref 6.1–8.1)

## 2015-11-21 LAB — CBC WITH DIFFERENTIAL/PLATELET
Basophils Absolute: 0 cells/uL (ref 0–200)
Basophils Relative: 0 %
Eosinophils Absolute: 38 cells/uL (ref 15–500)
Eosinophils Relative: 1 %
HCT: 40.7 % (ref 38.5–50.0)
Hemoglobin: 13.2 g/dL (ref 13.0–17.0)
Lymphocytes Relative: 30 %
Lymphs Abs: 1140 cells/uL (ref 850–3900)
MCH: 29.8 pg (ref 27.0–33.0)
MCHC: 32.4 g/dL (ref 32.0–36.0)
MCV: 91.9 fL (ref 80.0–100.0)
MPV: 12.2 fL (ref 7.5–12.5)
Monocytes Absolute: 760 cells/uL (ref 200–950)
Monocytes Relative: 20 %
Neutro Abs: 1862 cells/uL (ref 1500–7800)
Neutrophils Relative %: 49 %
Platelets: 137 10*3/uL — ABNORMAL LOW (ref 140–400)
RBC: 4.43 MIL/uL (ref 4.20–5.80)
RDW: 14.1 % (ref 11.0–15.0)
WBC: 3.8 10*3/uL (ref 3.8–10.8)

## 2015-11-23 ENCOUNTER — Encounter: Payer: Self-pay | Admitting: Family Medicine

## 2015-11-23 NOTE — Assessment & Plan Note (Signed)
Blood Pressure was significantly elevated on multiple checks. I think some of this was due to his anxiety as well. I gave him clonidine 0.1 mg blood pressure came down to 150/78 he also seemed much more calm. He did not have any shaking by the time he left the office. He will restart his atenolol He is also significant concern over his anxiety and treatment. His daughter feels like he is on too much medication but I query the fact that he is even taking his Prozac the Abilify or his lorazepam daily since it seems to become missing. I did do urine drug screen on him today. Unfortunately because he received 180 tablets when the past 4 weeks he cannot get another fill his medication therefore the family is going to go to the home insert for his lorazepam. Is possible that he is going into withdrawal since he has not been taking this regularly and he's been on lorazepam multiple times a day for many years. I agree with psychiatry appointment which she will have the end of the month.

## 2015-11-28 LAB — PAIN MGMT, ALCOHOL MET. W/CONF, U
Alcohol Metabolites: NEGATIVE ng/mL (ref <500)
Please note:: 0

## 2015-11-28 LAB — PAIN MGMT, PROFILE 1 CONF W/O MM, U
Alphahydroxyalprazolam: NEGATIVE ng/mL (ref <25)
Alphahydroxymidazolam: NEGATIVE ng/mL (ref <50)
Alphahydroxytriazolam: NEGATIVE ng/mL (ref <50)
Aminoclonazepam: NEGATIVE ng/mL (ref <25)
Amphetamines: NEGATIVE ng/mL (ref <500)
Barbiturates: NEGATIVE ng/mL (ref <300)
Benzodiazepines: POSITIVE ng/mL — AB (ref <100)
Cocaine Metabolite: NEGATIVE ng/mL (ref <150)
Creatinine: 146.4 mg/dL (ref >=20.0)
Hydroxyethylflurazepam: NEGATIVE ng/mL (ref <50)
Lorazepam: 3426 ng/mL — ABNORMAL HIGH (ref <50)
Marijuana Metabolite: NEGATIVE ng/mL (ref <20)
Methadone Metabolite: NEGATIVE ng/mL (ref <100)
Nordiazepam: NEGATIVE ng/mL (ref <50)
Opiates: NEGATIVE ng/mL (ref <100)
Oxazepam: NEGATIVE ng/mL (ref <50)
Oxidant: NEGATIVE ug/mL (ref <200)
Oxycodone: NEGATIVE ng/mL (ref <100)
Phencyclidine: NEGATIVE ng/mL (ref <25)
Temazepam: NEGATIVE ng/mL (ref <50)
pH: 6.11 (ref 4.5–9.0)

## 2015-12-03 ENCOUNTER — Ambulatory Visit (INDEPENDENT_AMBULATORY_CARE_PROVIDER_SITE_OTHER): Payer: Commercial Managed Care - HMO | Admitting: Pulmonary Disease

## 2015-12-03 ENCOUNTER — Other Ambulatory Visit (INDEPENDENT_AMBULATORY_CARE_PROVIDER_SITE_OTHER): Payer: Commercial Managed Care - HMO

## 2015-12-03 ENCOUNTER — Encounter: Payer: Self-pay | Admitting: Pulmonary Disease

## 2015-12-03 VITALS — BP 112/86 | HR 83 | Ht 66.0 in | Wt 178.0 lb

## 2015-12-03 DIAGNOSIS — R0600 Dyspnea, unspecified: Secondary | ICD-10-CM

## 2015-12-03 DIAGNOSIS — R0689 Other abnormalities of breathing: Secondary | ICD-10-CM

## 2015-12-03 LAB — APTT: aPTT: 26.8 s (ref 23.4–32.7)

## 2015-12-03 LAB — RHEUMATOID FACTOR: Rhuematoid fact SerPl-aCnc: <10 IU/mL (ref <=14)

## 2015-12-03 LAB — C-REACTIVE PROTEIN: CRP: 0.2 mg/dL — ABNORMAL LOW (ref 0.5–20.0)

## 2015-12-03 LAB — PROTIME-INR
INR: 1.1 ratio — ABNORMAL HIGH (ref 0.8–1.0)
Prothrombin Time: 11.7 s (ref 9.6–13.1)

## 2015-12-03 LAB — SEDIMENTATION RATE: Sed Rate: 12 mm/hr (ref 0–20)

## 2015-12-03 NOTE — Patient Instructions (Signed)
We will schedule for a CT scan of the chest without contrast and pulmonary function tests. We sent some lab work to evaluate activity of your rheumatoid arthritis.   Return to clinic in 1 month to review results and plan for further tests as needed

## 2015-12-03 NOTE — Progress Notes (Signed)
Evan [name]    [id number]    1928-01-13  Primary Care Physician:PICKARD,WARREN TOM, MD  Referring Physician: Alycia Rossetti, MD [address] [place], [place] [address]  Chief complaint:  Consult for evaluation of dyspnea.  HPI: Evan [name] is a 82 year old with past history as below. He complains of weakness, fatigue for the past 6 months. His symptoms of daily cough with no sputum and dyspnea. He does not report any wheezing, fevers, chills. He's never smoker with no known exposures.  He has been evaluated at his primary care hospital multiple times in the past 4 weeks symptoms of malaise, fatigue, somatic complaints. No clear cause has been identified for these symptoms. There is a concern if he was taking his medications as directed and he lives by himself. He has history of rheumatoid arthritis but is not on any anti-inflammatory medication. He does complain of joint pain in the hand, morning stiffness.   Outpatient Encounter Prescriptions as of 12/03/2015  Medication Sig  . albuterol (PROVENTIL HFA;VENTOLIN HFA) 108 (90 Base) MCG/ACT inhaler Inhale 1-2 puffs into the lungs every 6 (six) hours as needed for wheezing or shortness of breath.  . ARIPiprazole (ABILIFY) 5 MG tablet Take 1 tablet (5 mg total) by mouth at bedtime.  Marland Kitchen atenolol (TENORMIN) 50 MG tablet TAKE 1 TABLET BY MOUTH  EVERY DAY  . clopidogrel (PLAVIX) 75 MG tablet Take 1 tablet (75 mg total) by mouth daily.  . diclofenac sodium (VOLTAREN) 1 % GEL Apply 2 g topically daily as needed (for pain in knees and feet).  Marland Kitchen FLUoxetine (PROZAC) 20 MG capsule TAKE ONE CAPSULE BY MOUTH DAILY  . HYDROcodone-acetaminophen (NORCO/VICODIN) 5-325 MG tablet Take 1 tablet by mouth as needed for pain.  Marland Kitchen leflunomide (ARAVA) 10 MG tablet Take 10 mg by mouth daily.   Marland Kitchen Lifitegrast (XIIDRA) 5 % SOLN Apply 1 drop to eye 2 (two) times daily.  Marland Kitchen LORazepam (ATIVAN) 1 MG tablet TAKE ONE (1) TABLET BY MOUTH 3 TIMES DAILY. TAKE AT  8AM, 12 NOON, AND4PM.  . nitroGLYCERIN (NITROSTAT) 0.4 MG SL tablet Place 1 tablet (0.4 mg total) under the tongue every 5 (five) minutes as needed for chest pain.  . pantoprazole (PROTONIX) 40 MG tablet Take 1 tablet (40 mg total) by mouth daily.  . rosuvastatin (CRESTOR) 5 MG tablet Take 1 tablet (5 mg total) by mouth daily.  Marland Kitchen Spacer/Aero-Holding Chambers (AEROCHAMBER PLUS WITH MASK) inhaler Use as instructed  . tamsulosin (FLOMAX) 0.4 MG CAPS capsule Take 0.4 mg by mouth daily after supper.  . traMADol (ULTRAM) 50 MG tablet Take 1 tablet by mouth as needed for pain.  . Turmeric 500 MG TABS Take 1 tablet by mouth at bedtime.   No facility-administered encounter medications on file as of 12/03/2015.     Allergies as of 12/03/2015 - Review Complete 12/03/2015  Allergen Reaction Noted  . Feldene [piroxicam] Other (See Comments) 10/28/2010  . Statins Other (See Comments) 07/11/2013  . Doxycycline Other (See Comments) 10/28/2010  . Tequin Anxiety 10/28/2010  . Valium Other (See Comments) 10/28/2010    Past Medical History:  Diagnosis Date  . Anginal chest pain at rest [facility])    Chronic non, controlled on Lorazepam  . Anxiety   . Arthritis    "all over"   . BPH (benign prostatic hyperplasia)   . CAD (coronary artery disease)   . Chronic lower back pain   . Complication of anesthesia    "  problems making water afterwards"  . Depression   . DJD (degenerative joint disease)   . Esophageal ulcer without bleeding    bid ppi indefinitely  . Family history of adverse reaction to anesthesia    "children w/PONV"  . GERD (gastroesophageal reflux disease)   . Headache    "couple/week maybe" (09/04/2015)  . Hemochromatosis    Possible, elevated iron stores, hook-like osteophytes on hand films, normal LFTs  . Hepatitis    "yellow jaundice as a baby"  . History of ASCVD    MULTIVESSEL  . Hyperlipidemia   . Hypertension   . Myocardial infarction ([place]) 1999  . Osteoarthritis   . Pneumonia     "several times; got a little now" (09/04/2015)  . Rheumatoid arthritis [facility])     Past Surgical History:  Procedure Laterality Date  . ARM SKIN LESION BIOPSY / EXCISION Left 09/02/2015  . CATARACT EXTRACTION W/ INTRAOCULAR LENS  IMPLANT, BILATERAL Bilateral   . CHOLECYSTECTOMY OPEN    . CORNEAL TRANSPLANT Bilateral    "one at [facility]; one at [facility]"  . CORONARY ANGIOPLASTY WITH STENT PLACEMENT    . CORONARY ARTERY BYPASS GRAFT  1999  . DESCENDING AORTIC ANEURYSM REPAIR W/ STENT     DE stent ostium into the right radial free graft at OM-, 02-2007  . ESOPHAGOGASTRODUODENOSCOPY N/A 11/09/2014   Procedure: ESOPHAGOGASTRODUODENOSCOPY (EGD);  Surgeon: [name], MD;  Location: [facility] ENDOSCOPY;  Service: Endoscopy;  Laterality: N/A;  . INGUINAL HERNIA REPAIR    . JOINT REPLACEMENT    . NASAL SINUS SURGERY    . SHOULDER OPEN ROTATOR CUFF REPAIR Right   . TOTAL KNEE ARTHROPLASTY Bilateral     Family History  Problem Relation Age of Onset  . Arthritis-Osteo Sister   . Heart attack Brother   . Heart attack Other     Social History   Social History  . Marital status: Widowed    Spouse name: N/A  . Number of children: N/A  . Years of education: N/A   Occupational History  . Not on file.   Social History Main Topics  . Smoking status: Never Smoker  . Smokeless tobacco: Former Systems developer    Types: Chew     Comment: "quit chewing in the 1960s"  . Alcohol use No  . Drug use: No  . Sexual activity: Not on file   Other Topics Concern  . Not on file   Social History Narrative  . No narrative on file     Review of systems: Review of Systems  Constitutional: Negative for fever and chills.  HENT: Negative.   Eyes: Negative for blurred vision.  Respiratory: as per HPI  Cardiovascular: Negative for chest pain and palpitations.  Gastrointestinal: Negative for vomiting, diarrhea, blood per rectum. Genitourinary: Negative for dysuria, urgency, frequency and hematuria.  Musculoskeletal:  Negative for myalgias, back pain and joint pain.  Skin: Negative for itching and rash.  Neurological: Negative for dizziness, tremors, focal weakness, seizures and loss of consciousness.  Endo/Heme/Allergies: Negative for environmental allergies.  Psychiatric/Behavioral: Negative for depression, suicidal ideas and hallucinations.  All other systems reviewed and are negative.   Physical Exam: Blood pressure 112/86, pulse 83, height '5\' 6"'$  (1.676 m), weight 178 lb (80.7 kg), SpO2 94 %. Gen:      No acute distress HEENT:  EOMI, sclera anicteric Neck:     No masses; no thyromegaly Lungs:    Clear to auscultation bilaterally; normal respiratory effort CV:  Regular rate and rhythm; no murmurs Abd:      + bowel sounds; soft, non-tender; no palpable masses, no distension Ext:    No edema; adequate peripheral perfusion Skin:      Warm and dry; no rash Neuro: alert and oriented x 3 Psych: normal mood and affect  Data Reviewed: C T chest 08/23/11- RLL ground glass opacity 11 mm CT abd 07/13/14- RLL nodular opacity 7m, colitis CXR 10/19/15- No acute abnormality  Assessment:  82year old here in the clinic for evaluation of dyspnea. He is a lifelong nonsmoker and I don't suspect any COPD or airway disease. I have reviewed his lung imaging dating back to 2011. He was noted to have a right lower lobe ground glass opacity in 2013 and subsequent CT of the abdomen in 2016 shows that it had grown into a nodular opacity. This is concerning for malignancy suggests adenocarcinoma. Other possibilities include rheumatoid nodules.   I'll schedule him for a follow-up CT scan of the chest without contrast and also send him for pulmonary function tests for a reevaluation. Check labs to determine if his rheumatoid arthritis is active. He'll return to clinic after these tests to review the results and plan for further workup as needed.  Plan/Recommendations: - CT chest without contrast - PFTs - Check sed  rate, CRP, CCP and rheumatoid factor.   PMarshell GarfinkelMD [facility] Pager [phone] 54828/31/2017, 9:24 AM  CC: DAlycia Rossetti MD

## 2015-12-04 ENCOUNTER — Encounter: Payer: Self-pay | Admitting: Family Medicine

## 2015-12-04 ENCOUNTER — Ambulatory Visit (INDEPENDENT_AMBULATORY_CARE_PROVIDER_SITE_OTHER): Payer: Commercial Managed Care - HMO | Admitting: Family Medicine

## 2015-12-04 VITALS — BP 132/68 | HR 70 | Temp 97.3°F | Resp 20 | Ht 66.0 in | Wt 178.0 lb

## 2015-12-04 DIAGNOSIS — I10 Essential (primary) hypertension: Secondary | ICD-10-CM | POA: Diagnosis not present

## 2015-12-04 DIAGNOSIS — F419 Anxiety disorder, unspecified: Secondary | ICD-10-CM

## 2015-12-04 DIAGNOSIS — F32A Depression, unspecified: Secondary | ICD-10-CM

## 2015-12-04 DIAGNOSIS — F329 Major depressive disorder, single episode, unspecified: Secondary | ICD-10-CM

## 2015-12-04 LAB — ANA COMPREHENSIVE PANEL
Anti JO-1: <0.2 AI (ref 0.0–0.9)
Centromere Ab Screen: <0.2 AI (ref 0.0–0.9)
Chromatin Ab SerPl-aCnc: <0.2 AI (ref 0.0–0.9)
ENA RNP Ab: 0.3 AI (ref 0.0–0.9)
ENA SM Ab Ser-aCnc: <0.2 AI (ref 0.0–0.9)
ENA SSA (RO) Ab: <0.2 AI (ref 0.0–0.9)
ENA SSB (LA) Ab: <0.2 AI (ref 0.0–0.9)
Scleroderma SCL-70: <0.2 AI (ref 0.0–0.9)
dsDNA Ab: 2 IU/mL (ref 0–9)

## 2015-12-04 LAB — CYCLIC CITRUL PEPTIDE ANTIBODY, IGG: Cyclic Citrullin Peptide Ab: <16 Units

## 2015-12-04 NOTE — Progress Notes (Signed)
Subjective:    Patient ID: Evan [name], male    DOB: [birth year], 82 y.o.   MRN: [id number]  HPI  11/13/15 Patient has presented multiple times over the last few weeks with varying somatic complaints. Today he presents with episodic weakness. He states that over the last 2-3 weeks, he will occasionally feel extremely weak. He will feel like he needs to sit down and rest. Symptoms last approximately one hour and then resolve spontaneously. He denies any palpitations. He denies any syncope or presyncope. He denies any chest pain. He denies any significant shortness of breath or dyspnea. He has had a cough recently but is nonproductive. He reports mucus in his airways. He denies any fevers or chills. He denies any hemoptysis. He denies any pleurisy. He denies any strokelike symptoms or TIA symptoms. He denies any symptoms of serious illness. At that time, my plan was: Patient's physical exam is completely normal. I reassured the patient that I do not see anything abnormal on his exam. I believe a lot of his symptoms are due to anxiety and somatization,  he denies any symptoms of life-threatening illness, cardiac arrhythmia, TIA, or unstable angina. Therefore I recommended that we monitor his situation clinically in case his symptoms change or worsen in any way but I do not feel any further workup is necessary at this time  12/04/15 2 weeks ago, he saw my partner and his blood pressure was significantly elevated. She did a urine drug screen to ensure that he was taking his Ativan and that it was not being diverted. His urine drug screen showed compliance. However his blood pressure was significantly elevated. He resumed his atenolol. His blood pressure today is well controlled. He has since seen the pulmonologist as recommended pulmonary function test and imaging of the chest. This is to be performed in 2 weeks. He has not yet seen a psychiatrist. I was prescribing Abilify due to severe depression after the  death of his wife earlier this year, and uncontrolled anxiety with some attestation that I felt was prerenal delusional behavior. He has been on the Abilify now for several months with no benefit. He continues to complain of anxiety. He is also reporting tremors which the Abilify may be contributing to Past Medical History:  Diagnosis Date  . Anginal chest pain at rest [facility])    Chronic non, controlled on Lorazepam  . Anxiety   . Arthritis    "all over"   . BPH (benign prostatic hyperplasia)   . CAD (coronary artery disease)   . Chronic lower back pain   . Complication of anesthesia    "problems making water afterwards"  . Depression   . DJD (degenerative joint disease)   . Esophageal ulcer without bleeding    bid ppi indefinitely  . Family history of adverse reaction to anesthesia    "children w/PONV"  . GERD (gastroesophageal reflux disease)   . Headache    "couple/week maybe" (09/04/2015)  . Hemochromatosis    Possible, elevated iron stores, hook-like osteophytes on hand films, normal LFTs  . Hepatitis    "yellow jaundice as a baby"  . History of ASCVD    MULTIVESSEL  . Hyperlipidemia   . Hypertension   . Myocardial infarction ([place]) 1999  . Osteoarthritis   . Pneumonia    "several times; got a little now" (09/04/2015)  . Rheumatoid arthritis [facility])    Past Surgical History:  Procedure Laterality Date  . ARM SKIN LESION BIOPSY / EXCISION Left  09/02/2015  . CATARACT EXTRACTION W/ INTRAOCULAR LENS  IMPLANT, BILATERAL Bilateral   . CHOLECYSTECTOMY OPEN    . CORNEAL TRANSPLANT Bilateral    "one at [facility]; one at [facility]"  . CORONARY ANGIOPLASTY WITH STENT PLACEMENT    . CORONARY ARTERY BYPASS GRAFT  1999  . DESCENDING AORTIC ANEURYSM REPAIR W/ STENT     DE stent ostium into the right radial free graft at OM-, 02-2007  . ESOPHAGOGASTRODUODENOSCOPY N/A 11/09/2014   Procedure: ESOPHAGOGASTRODUODENOSCOPY (EGD);  Surgeon: [name], MD;  Location: [facility] ENDOSCOPY;  Service: Endoscopy;   Laterality: N/A;  . INGUINAL HERNIA REPAIR    . JOINT REPLACEMENT    . NASAL SINUS SURGERY    . SHOULDER OPEN ROTATOR CUFF REPAIR Right   . TOTAL KNEE ARTHROPLASTY Bilateral    Current Outpatient Prescriptions on File Prior to Visit  Medication Sig Dispense Refill  . albuterol (PROVENTIL HFA;VENTOLIN HFA) 108 (90 Base) MCG/ACT inhaler Inhale 1-2 puffs into the lungs every 6 (six) hours as needed for wheezing or shortness of breath. 1 Inhaler 1  . ARIPiprazole (ABILIFY) 5 MG tablet Take 1 tablet (5 mg total) by mouth at bedtime. 30 tablet 5  . atenolol (TENORMIN) 50 MG tablet TAKE 1 TABLET BY MOUTH  EVERY DAY 30 tablet 1  . clopidogrel (PLAVIX) 75 MG tablet Take 1 tablet (75 mg total) by mouth daily. 90 tablet 2  . diclofenac sodium (VOLTAREN) 1 % GEL Apply 2 g topically daily as needed (for pain in knees and feet).    Marland Kitchen FLUoxetine (PROZAC) 20 MG capsule TAKE ONE CAPSULE BY MOUTH DAILY 90 capsule 3  . HYDROcodone-acetaminophen (NORCO/VICODIN) 5-325 MG tablet Take 1 tablet by mouth as needed for pain.    Marland Kitchen leflunomide (ARAVA) 10 MG tablet Take 10 mg by mouth daily.     Marland Kitchen Lifitegrast (XIIDRA) 5 % SOLN Apply 1 drop to eye 2 (two) times daily. 1 each 3  . LORazepam (ATIVAN) 1 MG tablet TAKE ONE (1) TABLET BY MOUTH 3 TIMES DAILY. TAKE AT 8AM, 12 NOON, AND4PM. 90 tablet 2  . nitroGLYCERIN (NITROSTAT) 0.4 MG SL tablet Place 1 tablet (0.4 mg total) under the tongue every 5 (five) minutes as needed for chest pain. 25 tablet 0  . pantoprazole (PROTONIX) 40 MG tablet Take 1 tablet (40 mg total) by mouth daily. 90 tablet 2  . rosuvastatin (CRESTOR) 5 MG tablet Take 1 tablet (5 mg total) by mouth daily. 90 tablet 2  . Spacer/Aero-Holding Chambers (AEROCHAMBER PLUS WITH MASK) inhaler Use as instructed 1 each 2  . tamsulosin (FLOMAX) 0.4 MG CAPS capsule Take 0.4 mg by mouth daily after supper.    . traMADol (ULTRAM) 50 MG tablet Take 1 tablet by mouth as needed for pain.    . Turmeric 500 MG TABS Take 1  tablet by mouth at bedtime.     No current facility-administered medications on file prior to visit.    Allergies  Allergen Reactions  . Feldene [Piroxicam] Other (See Comments)    REACTION: Blisters  . Statins Other (See Comments)    Myalgias  . Doxycycline Other (See Comments)    REACTION:  unknown  . Tequin Anxiety  . Valium Other (See Comments)    REACTION:  unknown   Social History   Social History  . Marital status: Widowed    Spouse name: N/A  . Number of children: N/A  . Years of education: N/A   Occupational History  . Not on file.  Social History Main Topics  . Smoking status: Never Smoker  . Smokeless tobacco: Former Systems developer    Types: Chew     Comment: "quit chewing in the 1960s"  . Alcohol use No  . Drug use: No  . Sexual activity: Not on file   Other Topics Concern  . Not on file   Social History Narrative  . No narrative on file      Review of Systems  All other systems reviewed and are negative.      Objective:   Physical Exam  Constitutional: He is oriented to person, place, and time. He appears well-developed and well-nourished. No distress.  HENT:  Right Ear: External ear normal.  Left Ear: External ear normal.  Nose: Nose normal.  Mouth/Throat: Oropharynx is clear and moist. No oropharyngeal exudate.  Eyes: Conjunctivae and EOM are normal. Pupils are equal, round, and reactive to light.  Neck: Neck supple. No JVD present.  Cardiovascular: Normal rate, regular rhythm and normal heart sounds.  Exam reveals no gallop and no friction rub.   No murmur heard. Pulmonary/Chest: Effort normal and breath sounds normal. No respiratory distress. He has no wheezes. He has no rales. He exhibits no tenderness.  Abdominal: Soft. Bowel sounds are normal. He exhibits no distension and no mass. There is no tenderness. There is no rebound and no guarding.  Musculoskeletal: He exhibits no edema.  Lymphadenopathy:    He has no cervical adenopathy.    Neurological: He is alert and oriented to person, place, and time. No cranial nerve deficit. He exhibits normal muscle tone. Coordination normal.  Skin: He is not diaphoretic.  Vitals reviewed.         Assessment & Plan:  Hypertension, severe depression, anxiety disorder Blood pressure today is much improved. I'll make no changes in his atenolol dose. He is compliant with his Ativan taking it 3 times a day. I will like to continue him on Prozac. However I will discontinue the Abilify because I do not see that this is providing him any benefit with his somatizations or his depression or his anxiety.  I will defer further management to his psychiatrist

## 2015-12-15 ENCOUNTER — Telehealth: Payer: Self-pay | Admitting: Pulmonary Disease

## 2015-12-15 ENCOUNTER — Ambulatory Visit (INDEPENDENT_AMBULATORY_CARE_PROVIDER_SITE_OTHER)
Admission: RE | Admit: 2015-12-15 | Discharge: 2015-12-15 | Disposition: A | Discharge status: Home / Self Care | Payer: Commercial Managed Care - HMO | Source: Ambulatory Visit | Attending: Pulmonary Disease | Admitting: Pulmonary Disease

## 2015-12-15 DIAGNOSIS — R0600 Dyspnea, unspecified: Secondary | ICD-10-CM | POA: Diagnosis not present

## 2015-12-15 DIAGNOSIS — R918 Other nonspecific abnormal finding of lung field: Secondary | ICD-10-CM

## 2015-12-15 DIAGNOSIS — R911 Solitary pulmonary nodule: Secondary | ICD-10-CM | POA: Diagnosis not present

## 2015-12-15 NOTE — Telephone Encounter (Signed)
Received call report drom Stacy at [facility] Radiology regarding the patient's CT chest.  IMPRESSION: 1. Interval enlargement of what is now a 3.7 x 1.6 x 2.0 cm right lower lobe mass which is highly suspicious for a primary bronchogenic adenocarcinoma. Correlation with PET-CT is strongly recommended for further diagnostic and staging purposes. 2. Borderline enlarged 9 mm high right paratracheal lymph node is nonspecific. Attention at time of follow-up PET-CT is recommended. 3. Spectrum of findings in the lung bases concerning for developing interstitial lung disease. Outpatient referral to Pulmonology for further evaluation is suggested in the near future. 4. There are calcifications of the aortic valve. Echocardiographic correlation for evaluation of potential valvular dysfunction may be warranted if clinically indicated. 5. Mild cardiomegaly with mild left atrial dilatation. 6. Aortic atherosclerosis, in addition to left main and 3 vessel coronary artery disease. Status post median sternotomy for CABG. These results will be called to the ordering clinician or representative by the Radiologist Assistant, and communication documented in the PACS or zVision Dashboard.  Please advise Dr Vaughan Browner. Thanks.

## 2015-12-16 NOTE — Telephone Encounter (Signed)
I called and spoke with the patient and his daughter about the CT findings. The RLL opacity is bigger now, concerning for a malignancy. Please schedule for a PET scan, PFTs and CT guided biopsy of the nodule. Thanks.

## 2015-12-16 NOTE — Telephone Encounter (Signed)
Orders placed.  Nothing further needed. 

## 2015-12-18 DIAGNOSIS — H5200 Hypermetropia, unspecified eye: Secondary | ICD-10-CM | POA: Diagnosis not present

## 2015-12-18 DIAGNOSIS — H5210 Myopia, unspecified eye: Secondary | ICD-10-CM | POA: Diagnosis not present

## 2015-12-22 ENCOUNTER — Encounter (HOSPITAL_COMMUNITY)
Admission: RE | Admit: 2015-12-22 | Discharge: 2015-12-22 | Disposition: A | Discharge status: Home / Self Care | Payer: Commercial Managed Care - HMO | Source: Ambulatory Visit | Attending: Pulmonary Disease | Admitting: Pulmonary Disease

## 2015-12-22 DIAGNOSIS — R918 Other nonspecific abnormal finding of lung field: Secondary | ICD-10-CM

## 2015-12-22 DIAGNOSIS — R911 Solitary pulmonary nodule: Secondary | ICD-10-CM | POA: Diagnosis not present

## 2015-12-22 LAB — GLUCOSE, CAPILLARY: Glucose-Capillary: 103 mg/dL — ABNORMAL HIGH (ref 65–99)

## 2015-12-22 MED ORDER — FLUDEOXYGLUCOSE F - 18 (FDG) INJECTION
8.6000 | Freq: Once | INTRAVENOUS | Status: AC | PRN
  Administered 2015-12-22: 8.6 via INTRAVENOUS

## 2015-12-24 ENCOUNTER — Telehealth: Payer: Self-pay | Admitting: Pulmonary Disease

## 2015-12-24 NOTE — Telephone Encounter (Addendum)
PET scan results from 9/19 discussed with the patient. It shows positive uptake in RLL nodule suspicious for malignancy. There is non specific uptake in mediastinal and hilar LNs. We will proceed with CT biopsy.  Pt is on plavix for CAD, CABG in 1998, stent in 2008. OK to hold plavix for 5 days before procedure.  Marshell Garfinkel MD [place] [facility] Pager [phone] If no answer or after 3pm call: [phone] 12/24/2015, 9:34 AM

## 2015-12-28 ENCOUNTER — Other Ambulatory Visit: Payer: Self-pay | Admitting: Radiology

## 2015-12-29 ENCOUNTER — Encounter: Payer: Self-pay | Admitting: Family Medicine

## 2015-12-29 ENCOUNTER — Other Ambulatory Visit: Payer: Self-pay | Admitting: Physician Assistant

## 2015-12-29 ENCOUNTER — Ambulatory Visit (INDEPENDENT_AMBULATORY_CARE_PROVIDER_SITE_OTHER): Payer: Commercial Managed Care - HMO | Admitting: Family Medicine

## 2015-12-29 VITALS — BP 100/60 | HR 80 | Temp 97.6°F | Resp 16 | Ht 68.0 in | Wt 176.0 lb

## 2015-12-29 DIAGNOSIS — Z23 Encounter for immunization: Secondary | ICD-10-CM | POA: Diagnosis not present

## 2015-12-29 DIAGNOSIS — L309 Dermatitis, unspecified: Secondary | ICD-10-CM | POA: Diagnosis not present

## 2015-12-29 MED ORDER — FLUOCINONIDE-E 0.05 % EX CREA: 1.0000 "application " | TOPICAL_CREAM | Freq: Two times a day (BID) | CUTANEOUS | 0 refills | Status: DC

## 2015-12-29 NOTE — Progress Notes (Signed)
Subjective:    Patient ID: Evan [name], male    DOB: 1927/11/13, 82 y.o.   MRN: [id number]  HPI  Patient is scheduled for bronchoscopy and biopsy of the pulmonary nodule found incidentally on a CT scan tomorrow. She is very anxious about this. Over the last few days he has developed an itching sensation on his chest. Today there is not as much of a rash is just a patch of erythema and excoriation from frequently scratching at his chest. There is no other rash anywhere on the body. There are numerous linear excoriations. There are several small scabs. There is a rough texture to the skin but there are no discrete papules. I believe this is probably related to anxiety causing his itching. Allergic reaction. He states his been there for the last week. This was around the same time he was told that he had the pulmonary mass Past Medical History:  Diagnosis Date  . Anginal chest pain at rest [facility]    Chronic non, controlled on Lorazepam  . Anxiety   . Arthritis    "all over"   . BPH (benign prostatic hyperplasia)   . CAD (coronary artery disease)   . Chronic lower back pain   . Complication of anesthesia    "problems making water afterwards"  . Depression   . DJD (degenerative joint disease)   . Esophageal ulcer without bleeding    bid ppi indefinitely  . Family history of adverse reaction to anesthesia    "children w/PONV"  . GERD (gastroesophageal reflux disease)   . Headache    "couple/week maybe" (09/04/2015)  . Hemochromatosis    Possible, elevated iron stores, hook-like osteophytes on hand films, normal LFTs  . Hepatitis    "yellow jaundice as a baby"  . History of ASCVD    MULTIVESSEL  . Hyperlipidemia   . Hypertension   . Myocardial infarction ([place]) 1999  . Osteoarthritis   . Pneumonia    "several times; got a little now" (09/04/2015)  . Rheumatoid arthritis [facility])    Past Surgical History:  Procedure Laterality Date  . ARM SKIN LESION BIOPSY / EXCISION Left 09/02/2015  .  CATARACT EXTRACTION W/ INTRAOCULAR LENS  IMPLANT, BILATERAL Bilateral   . CHOLECYSTECTOMY OPEN    . CORNEAL TRANSPLANT Bilateral    "one at [facility]; one at [facility]"  . CORONARY ANGIOPLASTY WITH STENT PLACEMENT    . CORONARY ARTERY BYPASS GRAFT  1999  . DESCENDING AORTIC ANEURYSM REPAIR W/ STENT     DE stent ostium into the right radial free graft at OM-, 02-2007  . ESOPHAGOGASTRODUODENOSCOPY N/A 11/09/2014   Procedure: ESOPHAGOGASTRODUODENOSCOPY (EGD);  Surgeon: [name], MD;  Location: [facility] ENDOSCOPY;  Service: Endoscopy;  Laterality: N/A;  . INGUINAL HERNIA REPAIR    . JOINT REPLACEMENT    . NASAL SINUS SURGERY    . SHOULDER OPEN ROTATOR CUFF REPAIR Right   . TOTAL KNEE ARTHROPLASTY Bilateral    Current Outpatient Prescriptions on File Prior to Visit  Medication Sig Dispense Refill  . albuterol (PROVENTIL HFA;VENTOLIN HFA) 108 (90 Base) MCG/ACT inhaler Inhale 1-2 puffs into the lungs every 6 (six) hours as needed for wheezing or shortness of breath. 1 Inhaler 1  . atenolol (TENORMIN) 50 MG tablet TAKE 1 TABLET BY MOUTH  EVERY DAY 30 tablet 1  . clopidogrel (PLAVIX) 75 MG tablet Take 1 tablet (75 mg total) by mouth daily. 90 tablet 2  . diclofenac sodium (VOLTAREN) 1 % GEL Apply 2 g  topically daily as needed (for pain in knees and feet).    Marland Kitchen FLUoxetine (PROZAC) 20 MG capsule TAKE ONE CAPSULE BY MOUTH DAILY 90 capsule 3  . HYDROcodone-acetaminophen (NORCO/VICODIN) 5-325 MG tablet Take 1 tablet by mouth as needed for pain.    Marland Kitchen leflunomide (ARAVA) 10 MG tablet Take 10 mg by mouth daily.     Marland Kitchen Lifitegrast (XIIDRA) 5 % SOLN Apply 1 drop to eye 2 (two) times daily. 1 each 3  . LORazepam (ATIVAN) 1 MG tablet TAKE ONE (1) TABLET BY MOUTH 3 TIMES DAILY. TAKE AT 8AM, 12 NOON, AND4PM. 90 tablet 2  . nitroGLYCERIN (NITROSTAT) 0.4 MG SL tablet Place 1 tablet (0.4 mg total) under the tongue every 5 (five) minutes as needed for chest pain. 25 tablet 0  . pantoprazole (PROTONIX) 40 MG tablet Take 1 tablet (40  mg total) by mouth daily. 90 tablet 2  . rosuvastatin (CRESTOR) 5 MG tablet Take 1 tablet (5 mg total) by mouth daily. 90 tablet 2  . Spacer/Aero-Holding Chambers (AEROCHAMBER PLUS WITH MASK) inhaler Use as instructed 1 each 2  . tamsulosin (FLOMAX) 0.4 MG CAPS capsule Take 0.4 mg by mouth daily after supper.    . traMADol (ULTRAM) 50 MG tablet Take 1 tablet by mouth as needed for pain.    . Turmeric 500 MG TABS Take 1 tablet by mouth at bedtime.     No current facility-administered medications on file prior to visit.    Allergies  Allergen Reactions  . Feldene [Piroxicam] Other (See Comments)    REACTION: Blisters  . Statins Other (See Comments)    Myalgias  . Doxycycline Other (See Comments)    REACTION:  unknown  . Tequin Anxiety  . Valium Other (See Comments)    REACTION:  unknown   Social History   Social History  . Marital status: Widowed    Spouse name: N/A  . Number of children: N/A  . Years of education: N/A   Occupational History  . Not on file.   Social History Main Topics  . Smoking status: Never Smoker  . Smokeless tobacco: Former Systems developer    Types: Chew     Comment: "quit chewing in the 1960s"  . Alcohol use No  . Drug use: No  . Sexual activity: Not on file   Other Topics Concern  . Not on file   Social History Narrative  . No narrative on file     Review of Systems  All other systems reviewed and are negative.      Objective:   Physical Exam  Constitutional: He appears well-developed and well-nourished.  Cardiovascular: Normal rate, regular rhythm and normal heart sounds.   Pulmonary/Chest: Effort normal and breath sounds normal. No respiratory distress. He has no wheezes. He has no rales.  Skin: Rash noted. There is erythema.  Vitals reviewed.         Assessment & Plan:  Dermatitis - Plan: fluocinonide-emollient (LIDEX-E) 0.05 % cream  I think the patient is having itching and pruritis due to anxiety. I will give the patient Lidex  cream to be applied twice a day to the affected area to calm down the itching. I wished him the best to let tomorrow on his biopsy. I truly believe anxiety is the most likely explanation for his unexplained itching. He can use the Lidex for up to one week. I believe once the biopsy results returned and a plan is in place the itching should improve as his  anxiety improves. He did receive his flu shot today

## 2015-12-29 NOTE — Addendum Note (Signed)
Addended by: Shary Decamp B on: 12/29/2015 10:43 AM   Modules accepted: Orders

## 2015-12-30 ENCOUNTER — Ambulatory Visit (HOSPITAL_COMMUNITY)
Admission: RE | Admit: 2015-12-30 | Discharge: 2015-12-30 | Disposition: A | Discharge status: Home / Self Care | Payer: Commercial Managed Care - HMO | Source: Ambulatory Visit | Attending: Pulmonary Disease | Admitting: Pulmonary Disease

## 2015-12-30 ENCOUNTER — Ambulatory Visit (HOSPITAL_COMMUNITY)
Admission: RE | Admit: 2015-12-30 | Discharge: 2015-12-30 | Disposition: A | Discharge status: Home / Self Care | Payer: Commercial Managed Care - HMO | Source: Ambulatory Visit | Attending: Interventional Radiology | Admitting: Interventional Radiology

## 2015-12-30 ENCOUNTER — Encounter (HOSPITAL_COMMUNITY): Payer: Self-pay

## 2015-12-30 DIAGNOSIS — Z79899 Other long term (current) drug therapy: Secondary | ICD-10-CM | POA: Diagnosis not present

## 2015-12-30 DIAGNOSIS — K219 Gastro-esophageal reflux disease without esophagitis: Secondary | ICD-10-CM | POA: Diagnosis not present

## 2015-12-30 DIAGNOSIS — R911 Solitary pulmonary nodule: Secondary | ICD-10-CM | POA: Diagnosis not present

## 2015-12-30 DIAGNOSIS — R918 Other nonspecific abnormal finding of lung field: Secondary | ICD-10-CM | POA: Diagnosis not present

## 2015-12-30 DIAGNOSIS — I2510 Atherosclerotic heart disease of native coronary artery without angina pectoris: Secondary | ICD-10-CM | POA: Diagnosis not present

## 2015-12-30 DIAGNOSIS — C3431 Malignant neoplasm of lower lobe, right bronchus or lung: Secondary | ICD-10-CM | POA: Diagnosis not present

## 2015-12-30 DIAGNOSIS — Z9889 Other specified postprocedural states: Secondary | ICD-10-CM

## 2015-12-30 DIAGNOSIS — J939 Pneumothorax, unspecified: Secondary | ICD-10-CM | POA: Diagnosis not present

## 2015-12-30 LAB — CBC
HCT: 40.5 % (ref 39.0–52.0)
Hemoglobin: 13.2 g/dL (ref 13.0–17.0)
MCH: 29.6 pg (ref 26.0–34.0)
MCHC: 32.6 g/dL (ref 30.0–36.0)
MCV: 90.8 fL (ref 78.0–100.0)
Platelets: 100 10*3/uL — ABNORMAL LOW (ref 150–400)
RBC: 4.46 MIL/uL (ref 4.22–5.81)
RDW: 13 % (ref 11.5–15.5)
WBC: 4.1 10*3/uL (ref 4.0–10.5)

## 2015-12-30 LAB — APTT: aPTT: 29 seconds (ref 24–36)

## 2015-12-30 LAB — PROTIME-INR
INR: 1.04
Prothrombin Time: 13.6 seconds (ref 11.4–15.2)

## 2015-12-30 MED ORDER — FENTANYL CITRATE (PF) 100 MCG/2ML IJ SOLN
INTRAMUSCULAR | Status: AC | PRN
  Administered 2015-12-30: 25 ug via INTRAVENOUS

## 2015-12-30 MED ORDER — FENTANYL CITRATE (PF) 100 MCG/2ML IJ SOLN
INTRAMUSCULAR | Status: AC
  Filled 2015-12-30: qty 2

## 2015-12-30 MED ORDER — MIDAZOLAM HCL 2 MG/2ML IJ SOLN
INTRAMUSCULAR | Status: AC
  Filled 2015-12-30: qty 4

## 2015-12-30 MED ORDER — LIDOCAINE HCL 1 % IJ SOLN
INTRAMUSCULAR | Status: AC
  Filled 2015-12-30: qty 20

## 2015-12-30 MED ORDER — LIDOCAINE-EPINEPHRINE 1 %-1:100000 IJ SOLN
INTRAMUSCULAR | Status: AC
  Filled 2015-12-30: qty 1

## 2015-12-30 MED ORDER — FLUMAZENIL 0.5 MG/5ML IV SOLN
INTRAVENOUS | Status: DC
Start: 2015-12-30 — End: 2015-12-30
  Filled 2015-12-30: qty 5

## 2015-12-30 MED ORDER — MIDAZOLAM HCL 2 MG/2ML IJ SOLN
INTRAMUSCULAR | Status: AC | PRN
  Administered 2015-12-30: 0.5 mg via INTRAVENOUS

## 2015-12-30 MED ORDER — NALOXONE HCL 0.4 MG/ML IJ SOLN
INTRAMUSCULAR | Status: AC
  Filled 2015-12-30: qty 1

## 2015-12-30 MED ORDER — SODIUM CHLORIDE 0.9 % IV SOLN: Freq: Once | INTRAVENOUS | Status: DC

## 2015-12-30 NOTE — Procedures (Signed)
Technically successful CT guided biopsy of hypermetabolic right lower lobe pulmonary nodule.  EBL: None.  No immediate post procedural complications.   Ronny Bacon, MD Pager #: [phone]

## 2015-12-30 NOTE — Progress Notes (Signed)
Spoke with Dr. Pascal Lux re: CXR; states CXR read and ok to allow pt to eat and d/c home at 1600

## 2015-12-30 NOTE — Progress Notes (Signed)
Dr. Pascal Lux verified he would like repeat CXR.  Radiology aware.

## 2015-12-30 NOTE — H&P (Signed)
Chief Complaint: Patient was seen in consultation today for right lung mass biopsy at the request of [place]  Referring Physician(s): [place]  Supervising Physician: Sandi Mariscal  Patient Status: Outpatient  History of Present Illness: Evan [name] is a 82 y.o. 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 Work up included CT CT 12/15/15: IMPRESSION: 1. Interval enlargement of what is now a 3.7 x 1.6 x 2.0 cm right lower lobe mass which is highly suspicious for a primary bronchogenic adenocarcinoma. Correlation with PET-CT is strongly recommended for further diagnostic and staging purposes. 2. Borderline enlarged 9 mm high right paratracheal lymph node is nonspecific. Attention at time of follow-up PET-CT is recommended. 3. Spectrum of findings in the lung bases concerning for developing interstitial lung disease. Outpatient referral to Pulmonology for further evaluation is suggested in the near future. Marland Kitchen  PET 9/19: IMPRESSION: 1. Hypermetabolic (max SUV 3.9) posterior right lower lobe predominantly solid 2.9 cm pulmonary nodule, which is increased in size back to 2013, most consistent with primary bronchogenic adenocarcinoma. 2. Patchy subpleural reticulation throughout both lungs, basilar predominant, suspicious for interstitial lung disease, not well characterized on this study. Consider correlation with high-resolution chest CT in 6-12 months to assess temporal pattern stability, as clinically warranted. 3. Hypermetabolic bilateral hilar and mediastinal lymphadenopathy is highly nonspecific given the suspected underlying interstitial lung disease, and could be reactive or metastatic. 4. Nonspecific hypermetabolic right level 2 neck lymph node, favor reactive node, cannot exclude metastasis. 5. No hypermetabolic osseous or abdominopelvic metastatic disease. 6. Mild  hypermetabolism associated with subcutaneous fat stranding in the lateral right hip soft tissues, probably benign, correlate with any history of recent trauma or procedure in this location. 7. Relatively symmetric hypermetabolism in the bilateral glenohumeral joints with associated osteoarthritis, favor degenerative uptake. 8. Additional findings include mild cardiomegaly, aortic atherosclerosis, coronary atherosclerosis status post CABG and moderate sigmoid diverticulosis.  R lung mass; enlarging and +PET Now scheduled for biopsy per Dr Rolla Etienne request Last dose Plavix 5 days ago  Past Medical History:  Diagnosis Date  . Anginal chest pain at rest [facility])    Chronic non, controlled on Lorazepam  . Anxiety   . Arthritis    "all over"   . BPH (benign prostatic hyperplasia)   . CAD (coronary artery disease)   . Chronic lower back pain   . Complication of anesthesia    "problems making water afterwards"  . Depression   . DJD (degenerative joint disease)   . Esophageal ulcer without bleeding    bid ppi indefinitely  . Family history of adverse reaction to anesthesia    "children w/PONV"  . GERD (gastroesophageal reflux disease)   . Headache    "couple/week maybe" (09/04/2015)  . Hemochromatosis    Possible, elevated iron stores, hook-like osteophytes on hand films, normal LFTs  . Hepatitis    "yellow jaundice as a baby"  . History of ASCVD    MULTIVESSEL  . Hyperlipidemia   . Hypertension   . Myocardial infarction ([place]) 1999  . Osteoarthritis   . Pneumonia    "several times; got a little now" (09/04/2015)  . Rheumatoid arthritis [facility])     Past Surgical History:  Procedure Laterality Date  . ARM SKIN LESION BIOPSY / EXCISION Left 09/02/2015  . CATARACT EXTRACTION W/ INTRAOCULAR LENS  IMPLANT, BILATERAL Bilateral   . CHOLECYSTECTOMY OPEN    . CORNEAL TRANSPLANT Bilateral    "  one at [facility]; one at [facility]  . CORONARY ANGIOPLASTY WITH STENT PLACEMENT    . CORONARY ARTERY BYPASS  GRAFT  1999  . DESCENDING AORTIC ANEURYSM REPAIR W/ STENT     DE stent ostium into the right radial free graft at OM-, 02-2007  . ESOPHAGOGASTRODUODENOSCOPY N/A 11/09/2014   Procedure: ESOPHAGOGASTRODUODENOSCOPY (EGD);  Surgeon: [name], MD;  Location: [facility] ENDOSCOPY;  Service: Endoscopy;  Laterality: N/A;  . INGUINAL HERNIA REPAIR    . JOINT REPLACEMENT    . NASAL SINUS SURGERY    . SHOULDER OPEN ROTATOR CUFF REPAIR Right   . TOTAL KNEE ARTHROPLASTY Bilateral     Allergies: Feldene [piroxicam]; Statins; Doxycycline; Latex; Tequin; and Valium  Medications: Prior to Admission medications   Medication Sig Start Date End Date Taking? Authorizing Provider  albuterol (PROVENTIL HFA;VENTOLIN HFA) 108 (90 Base) MCG/ACT inhaler Inhale 1-2 puffs into the lungs every 6 (six) hours as needed for wheezing or shortness of breath. 11/09/15  Yes Alycia Rossetti, MD  atenolol (TENORMIN) 50 MG tablet TAKE 1 TABLET BY MOUTH  EVERY DAY 11/20/15  Yes Alycia Rossetti, MD  clopidogrel (PLAVIX) 75 MG tablet Take 1 tablet (75 mg total) by mouth daily. 11/02/15  Yes Jettie Booze, MD  diclofenac sodium (VOLTAREN) 1 % GEL Apply 2 g topically daily as needed (for pain in knees and feet).   Yes Historical Provider, MD  fluocinonide-emollient (LIDEX-E) 0.05 % cream Apply 1 application topically 2 (two) times daily. 12/29/15  Yes [name], MD  FLUoxetine (PROZAC) 20 MG capsule TAKE ONE CAPSULE BY MOUTH DAILY 06/15/15  Yes [name], MD  HYDROcodone-acetaminophen (NORCO/VICODIN) 5-325 MG tablet Take 1 tablet by mouth as needed for pain. 07/31/15  Yes Historical Provider, MD  leflunomide (ARAVA) 10 MG tablet Take 10 mg by mouth daily.  08/01/15  Yes Historical Provider, MD  Lifitegrast Shirley Friar) 5 % SOLN Apply 1 drop to eye 2 (two) times daily. 08/20/15  Yes [name], MD  LORazepam (ATIVAN) 1 MG tablet TAKE ONE (1) TABLET BY MOUTH 3 TIMES DAILY. TAKE AT [facility] [place], [place]. 11/02/15  Yes [name], MD  pantoprazole (PROTONIX) 40 MG tablet Take 1 tablet (40 mg total) by mouth daily. 11/02/15  Yes Jettie Booze, MD  rosuvastatin (CRESTOR) 5 MG tablet Take 1 tablet (5 mg total) by mouth daily. 11/02/15  Yes Jettie Booze, MD  tamsulosin (FLOMAX) 0.4 MG CAPS capsule Take 0.4 mg by mouth daily after supper.   Yes Historical Provider, MD  traMADol (ULTRAM) 50 MG tablet Take 1 tablet by mouth as needed for pain. 07/15/10  Yes Historical Provider, MD  traZODone (DESYREL) 50 MG tablet Take 50 mg by mouth at bedtime as needed for sleep.   Yes Historical Provider, MD  nitroGLYCERIN (NITROSTAT) 0.4 MG SL tablet Place 1 tablet (0.4 mg total) under the tongue every 5 (five) minutes as needed for chest pain. 07/14/15   Jettie Booze, MD  Spacer/Aero-Holding Chambers (AEROCHAMBER PLUS WITH MASK) inhaler Use as instructed 10/19/15   Charlesetta Shanks, MD  Turmeric 500 MG TABS Take 1 tablet by mouth at bedtime.    Historical Provider, MD  Vitamin D, Ergocalciferol, (DRISDOL) 50000 units CAPS capsule Take 50,000 Units by mouth every 7 (seven) days.    Historical Provider, MD     Family History  Problem Relation Age of Onset  . Arthritis-Osteo Sister   . Heart attack Brother   . Heart attack  Other     Social History   Social History  . Marital status: Widowed    Spouse name: N/A  . Number of children: N/A  . Years of education: N/A   Social History Main Topics  . Smoking status: Never Smoker  . Smokeless tobacco: Former Systems developer    Types: Chew     Comment: "quit chewing in the 1960s"  . Alcohol use No  . Drug use: No  . Sexual activity: Not Asked   Other Topics Concern  . None   Social History Narrative  . None    Review of Systems: A 12 point ROS discussed and pertinent positives are indicated in the HPI above.  All other systems are negative.  Review of Systems  Constitutional: Positive for activity change and fatigue. Negative for appetite change and fever.    Respiratory: Positive for cough, chest tightness and shortness of breath.   Cardiovascular: Negative for chest pain.  Gastrointestinal: Negative for abdominal pain.  Musculoskeletal: Negative for back pain.  Neurological: Positive for weakness.  Psychiatric/Behavioral: Negative for behavioral problems and confusion.    Vital Signs: BP (!) 143/83   Pulse 60   Temp 97.2 F (36.2 C)   Resp 18   Ht '5\' 5"'$  (1.651 m)   Wt 177 lb (80.3 kg)   SpO2 99%   BMI 29.45 kg/m   Physical Exam  Constitutional: He is oriented to person, place, and time.  Cardiovascular: Normal rate, regular rhythm and normal heart sounds.   Pulmonary/Chest: Effort normal and breath sounds normal. He has no wheezes.  Abdominal: Soft. Bowel sounds are normal. There is no tenderness.  Musculoskeletal: Normal range of motion.  Neurological: He is alert and oriented to person, place, and time.  Skin: Skin is warm and dry.  Psychiatric: He has a normal mood and affect. His behavior is normal. Judgment and thought content normal.  Nursing note and vitals reviewed.   Mallampati Score:  MD Evaluation Airway: WNL Heart: WNL Abdomen: WNL Chest/ Lungs: WNL ASA  Classification: 3 Mallampati/Airway Score: One  Imaging: Ct Chest Wo Contrast  Result Date: 12/15/2015 CLINICAL DATA:  82 year old male with history of dyspnea on exertion and cough. Right lower lobe nodule noted on prior CT the abdomen and pelvis. Followup study. EXAM: CT CHEST WITHOUT CONTRAST TECHNIQUE: Multidetector CT imaging of the chest was performed following the standard protocol without IV contrast. COMPARISON:  CT the abdomen and pelvis 07/13/2014. FINDINGS: Cardiovascular: Heart size is mildly enlarged with left atrial dilatation. There is no significant pericardial fluid, thickening or pericardial calcification. There is aortic atherosclerosis, as well as atherosclerosis of the great vessels of the mediastinum and the coronary arteries, including  calcified atherosclerotic plaque in the left main, left anterior descending, left circumflex and right coronary arteries. Severe calcifications of the aortic valve. Mediastinum/Nodes: Borderline enlarged high right paratracheal lymph node measuring 9 mm in short axis is nonspecific. No other definite lymphadenopathy noted in mediastinal or hilar nodal stations. Please note that accurate exclusion of hilar adenopathy is limited on noncontrast CT scans. Esophagus is unremarkable in appearance. No axillary lymphadenopathy. Lungs/Pleura: Compared to the prior examination from 07/13/2014 the size of the previously noted right lower lobe nodule has significantly increased, and this is currently pain 3.7 x 1.6 x 2.0 cm mass (axial image 109 of series 3 and sagittal image 59 of series 6) which is centrally solid in appearance with peripheral ground-glass attenuation, and internal air bronchograms, considered very highly suspicious for a primary  bronchogenic adenocarcinoma. Septal thickening noted throughout the visualized lung bases, concerning for underlying interstitial lung disease, new compared to the prior examination. No acute consolidative airspace disease. No pleural effusions. Small calcified granuloma in the left lower lobe incidentally noted. Upper Abdomen: Calcified granuloma in the right lobe of the liver incidentally noted. Status post cholecystectomy. Musculoskeletal: Mean sternotomy wires. There are no aggressive appearing lytic or blastic lesions noted in the visualized portions of the skeleton. IMPRESSION: 1. Interval enlargement of what is now a 3.7 x 1.6 x 2.0 cm right lower lobe mass which is highly suspicious for a primary bronchogenic adenocarcinoma. Correlation with PET-CT is strongly recommended for further diagnostic and staging purposes. 2. Borderline enlarged 9 mm high right paratracheal lymph node is nonspecific. Attention at time of follow-up PET-CT is recommended. 3. Spectrum of findings in  the lung bases concerning for developing interstitial lung disease. Outpatient referral to Pulmonology for further evaluation is suggested in the near future. 4. There are calcifications of the aortic valve. Echocardiographic correlation for evaluation of potential valvular dysfunction may be warranted if clinically indicated. 5. Mild cardiomegaly with mild left atrial dilatation. 6. Aortic atherosclerosis, in addition to left main and 3 vessel coronary artery disease. Status post median sternotomy for CABG. These results will be called to the ordering clinician or representative by the Radiologist Assistant, and communication documented in the PACS or zVision Dashboard. Electronically Signed   By: Vinnie Langton M.D.   On: 12/15/2015 11:27   Nm Pet Image Initial (pi) Skull Base To Thigh  Result Date: 12/22/2015 CLINICAL DATA:  Initial treatment strategy for enlarging right lower lobe pulmonary nodule. EXAM: NUCLEAR MEDICINE PET SKULL BASE TO THIGH TECHNIQUE: 8.6 mCi F-18 FDG was injected intravenously. Full-ring PET imaging was performed from the skull base to thigh after the radiotracer. CT data was obtained and used for attenuation correction and anatomic localization. FASTING BLOOD GLUCOSE:  Value: 103 mg/dl COMPARISON:  12/15/2015 chest CT.  07/13/2014 CT abdomen/pelvis . FINDINGS: NECK There is hypermetabolic 0.7 cm right level 2 neck node with max SUV 5.3 (series 4/ image 29). No additional hypermetabolic neck lymph nodes. CHEST Predominantly solid 2.9 x 2.6 cm posterior right lower lobe pulmonary nodule with peripheral ground-glass density is mildly hypermetabolic with max SUV 3.9 (series 8/image 45), which is increased from 1.3 x 1.1 cm on 09/02/2011, and is most consistent with primary bronchogenic adenocarcinoma. No acute consolidative airspace disease or additional significant pulmonary nodules. Patchy subpleural reticulation is again noted throughout both lungs, basilar predominant. Hypermetabolic  1.0 cm right subcarinal node with max SUV 4.2 (series 4/image 76). Hypermetabolic right lower paratracheal 0.8 cm node with max SUV 4.6 (series 4/image 66). Hypermetabolic AP window 0.6 cm node with max SUV 4.8 (series 4/image 65). Hypermetabolic bilateral hilar adenopathy with max SUV 6.2 on the right and 8.0 on the left. Mild cardiomegaly. Left main, left anterior descending, left circumflex and right coronary atherosclerosis status post CABG with ascending aortic and left internal mammary bypass grafts. Sternotomy wires appear aligned and intact. Atherosclerotic nonaneurysmal thoracic aorta. No pleural effusions. ABDOMEN/PELVIS No abnormal hypermetabolic activity within the liver, pancreas, adrenal glands, or spleen. No hypermetabolic lymph nodes in the abdomen or pelvis. Cholecystectomy. Moderate sigmoid diverticulosis. Atherosclerotic nonaneurysmal abdominal aorta. There is mild hypermetabolism associated with subcutaneous fat stranding in the lateral right hip soft tissues. No discrete mass or fluid collection in this location. SKELETON No focal hypermetabolic activity to suggest skeletal metastasis. Relatively symmetric hypermetabolism is seen in the bilateral glenohumeral  joints with associated osteoarthritis in the bilateral glenohumeral joints. IMPRESSION: 1. Hypermetabolic (max SUV 3.9) posterior right lower lobe predominantly solid 2.9 cm pulmonary nodule, which is increased in size back to 2013, most consistent with primary bronchogenic adenocarcinoma. 2. Patchy subpleural reticulation throughout both lungs, basilar predominant, suspicious for interstitial lung disease, not well characterized on this study. Consider correlation with high-resolution chest CT in 6-12 months to assess temporal pattern stability, as clinically warranted. 3. Hypermetabolic bilateral hilar and mediastinal lymphadenopathy is highly nonspecific given the suspected underlying interstitial lung disease, and could be reactive or  metastatic. 4. Nonspecific hypermetabolic right level 2 neck lymph node, favor reactive node, cannot exclude metastasis. 5. No hypermetabolic osseous or abdominopelvic metastatic disease. 6. Mild hypermetabolism associated with subcutaneous fat stranding in the lateral right hip soft tissues, probably benign, correlate with any history of recent trauma or procedure in this location. 7. Relatively symmetric hypermetabolism in the bilateral glenohumeral joints with associated osteoarthritis, favor degenerative uptake. 8. Additional findings include mild cardiomegaly, aortic atherosclerosis, coronary atherosclerosis status post CABG and moderate sigmoid diverticulosis. Electronically Signed   By: Ilona Sorrel M.D.   On: 12/22/2015 15:25    Labs:  CBC:  Recent Labs  09/21/15 0940 10/19/15 0938 11/20/15 1637 12/30/15 0915  WBC 5.7 4.9 3.8 4.1  HGB 13.5 12.6* 13.2 13.2  HCT 40.5 39.2 40.7 40.5  PLT 182 126* 137* PENDING    COAGS:  Recent Labs  09/05/15 0504 12/03/15 1012  INR 1.17 1.1*  APTT  --  26.8    BMP:  Recent Labs  09/10/15 0512 09/17/15 1037 09/21/15 0940 10/19/15 0938 11/20/15 1637  NA 136 137 140 136 136  K 4.6 4.1 4.6 3.7 4.0  CL 105 108 107 107 106  CO2 '24 20 22 24 '$ 18*  GLUCOSE 122* 120* 145* 103* 105*  BUN '9 17 15 13 17  '$ CALCIUM 8.8* 8.9 8.9 9.0 8.9  CREATININE 0.91 0.98 1.04 0.97 1.11  GFRNONAA >60 70 65 >60  --   GFRAA >60 80 75 >60  --     LIVER FUNCTION TESTS:  Recent Labs  09/06/15 0548 09/17/15 1037 09/21/15 0940 11/20/15 1637  BILITOT 1.2 1.0 0.5 0.7  AST '15 16 18 23  '$ ALT 10* '10 10 11  '$ ALKPHOS 55 79 79 73  PROT 4.9* 6.0* 6.0* 6.3  ALBUMIN 2.4* 3.6 3.7 4.0    TUMOR MARKERS: No results for input(s): AFPTM, CEA, CA199, CHROMGRNA in the last 8760 hours.  Assessment and Plan:  Enlarging +PET right lung mass Now scheduled for biopsy Risks and Benefits discussed with the patient including, but not limited to bleeding, hemoptysis,  respiratory failure requiring intubation, infection, pneumothorax requiring chest tube placement, stroke from air embolism or even death. All of the patient's questions were answered, patient is agreeable to proceed. Consent signed and in chart.   Thank you for this interesting consult.  I greatly enjoyed meeting ABBY STINES and look forward to participating in their care.  A copy of this report was sent to the requesting provider on this date.  Electronically Signed: Eiman Maret A 12/30/2015, 10:20 AM   I spent a total of  30 Minutes   in face to face in clinical consultation, greater than 50% of which was counseling/coordinating care for right lung mass biopsy

## 2015-12-30 NOTE — Discharge Instructions (Signed)
Needle Biopsy of Lung, Care After °Refer to this sheet in the next few weeks. These instructions provide you with information on caring for yourself after your procedure. Your health care provider may also give you more specific instructions. Your treatment has been planned according to current medical practices, but problems sometimes occur. Call your health care provider if you have any problems or questions after your procedure. °WHAT TO EXPECT AFTER THE PROCEDURE °· A bandage will be applied over the area where the needle was inserted. You may be asked to apply pressure to the bandage for several minutes to ensure there is minimal bleeding. °· In most cases, you can leave when your needle biopsy procedure is completed. Do not drive yourself home. Someone else should take you home. °· If you received an IV sedative or general anesthetic, you will be taken to a comfortable place to relax while the medicine wears off. °· If you have upcoming travel scheduled, talk to your health care provider about when it is safe to travel by air after the procedure. °HOME CARE INSTRUCTIONS °· Expect to take it easy for the rest of the day. °· Protect the area where you received the needle biopsy by keeping the bandage in place for as Rollyn Scialdone as instructed. °· You may feel some mild pain or discomfort in the area, but this should stop in a day or two. °· Take medicines only as directed by your health care provider. °SEEK MEDICAL CARE IF:  °· You have pain at the biopsy site that worsens or is not helped by medicine. °· You have swelling or drainage at the needle biopsy site. °· You have a fever. °SEEK IMMEDIATE MEDICAL CARE IF:  °· You have new or worsening shortness of breath. °· You have chest pain. °· You are coughing up blood. °· You have bleeding that does not stop with pressure or a bandage. °· You develop light-headedness or fainting. °  °This information is not intended to replace advice given to you by your health care  provider. Make sure you discuss any questions you have with your health care provider. °  °Document Released: 01/16/2007 Document Revised: 04/11/2014 Document Reviewed: 08/13/2012 °Elsevier Interactive Patient Education ©2016 [employer] ° °

## 2016-01-05 ENCOUNTER — Ambulatory Visit (INDEPENDENT_AMBULATORY_CARE_PROVIDER_SITE_OTHER): Payer: Commercial Managed Care - HMO | Admitting: Pulmonary Disease

## 2016-01-05 ENCOUNTER — Encounter: Payer: Self-pay | Admitting: Pulmonary Disease

## 2016-01-05 ENCOUNTER — Ambulatory Visit (HOSPITAL_COMMUNITY)
Admission: RE | Admit: 2016-01-05 | Discharge: 2016-01-05 | Disposition: A | Discharge status: Home / Self Care | Payer: Commercial Managed Care - HMO | Source: Ambulatory Visit | Attending: Pulmonary Disease | Admitting: Pulmonary Disease

## 2016-01-05 VITALS — BP 138/64 | HR 67 | Ht 65.0 in | Wt 177.0 lb

## 2016-01-05 DIAGNOSIS — R0609 Other forms of dyspnea: Secondary | ICD-10-CM | POA: Diagnosis not present

## 2016-01-05 DIAGNOSIS — C3491 Malignant neoplasm of unspecified part of right bronchus or lung: Secondary | ICD-10-CM

## 2016-01-05 DIAGNOSIS — R918 Other nonspecific abnormal finding of lung field: Secondary | ICD-10-CM | POA: Diagnosis not present

## 2016-01-05 DIAGNOSIS — R0600 Dyspnea, unspecified: Secondary | ICD-10-CM | POA: Diagnosis not present

## 2016-01-05 DIAGNOSIS — R942 Abnormal results of pulmonary function studies: Secondary | ICD-10-CM | POA: Diagnosis not present

## 2016-01-05 LAB — PULMONARY FUNCTION TEST
DL/VA % pred: 56 %
DL/VA: 2.4 ml/min/mmHg/L
DLCO cor % pred: 46 %
DLCO cor: 11.98 ml/min/mmHg
DLCO unc % pred: 44 %
DLCO unc: 11.48 ml/min/mmHg
FEF 25-75 Post: 2.53 L/sec
FEF 25-75 Pre: 2.6 L/sec
FEF2575-%Change-Post: -2 %
FEF2575-%Pred-Post: 209 %
FEF2575-%Pred-Pre: 214 %
FEV1-%Change-Post: 0 %
FEV1-%Pred-Post: 129 %
FEV1-%Pred-Pre: 131 %
FEV1-Post: 2.58 L
FEV1-Pre: 2.61 L
FEV1FVC-%Change-Post: 0 %
FEV1FVC-%Pred-Pre: 114 %
FEV6-%Change-Post: -2 %
FEV6-%Pred-Post: 118 %
FEV6-%Pred-Pre: 121 %
FEV6-Post: 3.16 L
FEV6-Pre: 3.26 L
FEV6FVC-%Change-Post: -1 %
FEV6FVC-%Pred-Post: 107 %
FEV6FVC-%Pred-Pre: 109 %
FVC-%Change-Post: -1 %
FVC-%Pred-Post: 109 %
FVC-%Pred-Pre: 111 %
FVC-Post: 3.23 L
FVC-Pre: 3.26 L
Post FEV1/FVC ratio: 80 %
Post FEV6/FVC ratio: 98 %
Pre FEV1/FVC ratio: 80 %
Pre FEV6/FVC Ratio: 100 %
RV % pred: 86 %
RV: 2.17 L
TLC % pred: 90 %
TLC: 5.47 L

## 2016-01-05 MED ORDER — ALBUTEROL SULFATE (2.5 MG/3ML) 0.083% IN NEBU
2.5000 mg | INHALATION_SOLUTION | Freq: Once | RESPIRATORY_TRACT | Status: AC
  Administered 2016-01-05: 2.5 mg via RESPIRATORY_TRACT

## 2016-01-05 NOTE — Progress Notes (Signed)
Evan [name]    [id number]    1928-01-22  Primary Care Physician:PICKARD,WARREN TOM, MD  Referring Physician: [name], MD [address] [place], [place] [address]  Chief complaint:   Follow up for dyspnea RLL lung mass  HPI: Evan [name] is a 82 year old with past history as below. He complains of weakness, fatigue for the past 6 months. His symptoms of daily cough with no sputum and dyspnea. He does not report any wheezing, fevers, chills. He's never smoker with no known exposures.  He has been evaluated at his primary care hospital multiple times in the past 4 weeks symptoms of malaise, fatigue, somatic complaints. No clear cause has been identified for these symptoms. There is a concern if he was taking his medications as directed and he lives by himself. He has history of rheumatoid arthritis but is not on any anti-inflammatory medication. He does complain of joint pain in the hand, morning stiffness.  A CT earlier this year showed an enlarging right lower lobe mass with positive uptake on PET. He subsequently underwent a CT-guided biopsy with a diagnosis of adenocarcinoma.  Outpatient Encounter Prescriptions as of 01/05/2016  Medication Sig  . albuterol (PROVENTIL HFA;VENTOLIN HFA) 108 (90 Base) MCG/ACT inhaler Inhale 1-2 puffs into the lungs every 6 (six) hours as needed for wheezing or shortness of breath.  Marland Kitchen atenolol (TENORMIN) 50 MG tablet TAKE 1 TABLET BY MOUTH  EVERY DAY  . clopidogrel (PLAVIX) 75 MG tablet Take 1 tablet (75 mg total) by mouth daily.  . diclofenac sodium (VOLTAREN) 1 % GEL Apply 2 g topically daily as needed (for pain in knees and feet).  . fluocinonide-emollient (LIDEX-E) 0.05 % cream Apply 1 application topically 2 (two) times daily.  Marland Kitchen FLUoxetine (PROZAC) 20 MG capsule TAKE ONE CAPSULE BY MOUTH DAILY  . HYDROcodone-acetaminophen (NORCO/VICODIN) 5-325 MG tablet Take 1 tablet by mouth as needed for pain.  Marland Kitchen leflunomide (ARAVA) 10 MG  tablet Take 10 mg by mouth daily.   Marland Kitchen Lifitegrast (XIIDRA) 5 % SOLN Apply 1 drop to eye 2 (two) times daily.  Marland Kitchen LORazepam (ATIVAN) 1 MG tablet TAKE ONE (1) TABLET BY MOUTH 3 TIMES DAILY. TAKE AT 8AM, 12 NOON, AND4PM.  . nitroGLYCERIN (NITROSTAT) 0.4 MG SL tablet Place 1 tablet (0.4 mg total) under the tongue every 5 (five) minutes as needed for chest pain.  . pantoprazole (PROTONIX) 40 MG tablet Take 1 tablet (40 mg total) by mouth daily.  . rosuvastatin (CRESTOR) 5 MG tablet Take 1 tablet (5 mg total) by mouth daily.  Marland Kitchen Spacer/Aero-Holding Chambers (AEROCHAMBER PLUS WITH MASK) inhaler Use as instructed  . tamsulosin (FLOMAX) 0.4 MG CAPS capsule Take 0.4 mg by mouth daily after supper.  . traMADol (ULTRAM) 50 MG tablet Take 1 tablet by mouth as needed for pain.  . traZODone (DESYREL) 50 MG tablet Take 50 mg by mouth at bedtime as needed for sleep.  . Turmeric 500 MG TABS Take 1 tablet by mouth at bedtime.  . Vitamin D, Ergocalciferol, (DRISDOL) 50000 units CAPS capsule Take 50,000 Units by mouth every 7 (seven) days.  . [DISCONTINUED] ARIPiprazole (ABILIFY) 5 MG tablet Take 5 mg by mouth daily.   No facility-administered encounter medications on file as of 01/05/2016.     Allergies as of 01/05/2016 - Review Complete 01/05/2016  Allergen Reaction Noted  . Feldene [piroxicam] Other (See Comments) 10/28/2010  . Statins Other (See Comments) 07/11/2013  . Doxycycline  Other (See Comments) 10/28/2010  . Latex Rash 12/30/2015  . Tequin Anxiety 10/28/2010  . Valium Other (See Comments) 10/28/2010    Past Medical History:  Diagnosis Date  . Anginal chest pain at rest [facility])    Chronic non, controlled on Lorazepam  . Anxiety   . Arthritis    "all over"   . BPH (benign prostatic hyperplasia)   . CAD (coronary artery disease)   . Chronic lower back pain   . Complication of anesthesia    "problems making water afterwards"  . Depression   . DJD (degenerative joint disease)   . Esophageal  ulcer without bleeding    bid ppi indefinitely  . Family history of adverse reaction to anesthesia    "children w/PONV"  . GERD (gastroesophageal reflux disease)   . Headache    "couple/week maybe" (09/04/2015)  . Hemochromatosis    Possible, elevated iron stores, hook-like osteophytes on hand films, normal LFTs  . Hepatitis    "yellow jaundice as a baby"  . History of ASCVD    MULTIVESSEL  . Hyperlipidemia   . Hypertension   . Myocardial infarction 1999  . Osteoarthritis   . Pneumonia    "several times; got a little now" (09/04/2015)  . Rheumatoid arthritis [facility])     Past Surgical History:  Procedure Laterality Date  . ARM SKIN LESION BIOPSY / EXCISION Left 09/02/2015  . CATARACT EXTRACTION W/ INTRAOCULAR LENS  IMPLANT, BILATERAL Bilateral   . CHOLECYSTECTOMY OPEN    . CORNEAL TRANSPLANT Bilateral    "one [facility]; one at [facility]"  . CORONARY ANGIOPLASTY WITH STENT PLACEMENT    . CORONARY ARTERY BYPASS GRAFT  1999  . DESCENDING AORTIC ANEURYSM REPAIR W/ STENT     DE stent ostium into the right radial free graft at OM-, 02-2007  . ESOPHAGOGASTRODUODENOSCOPY N/A 11/09/2014   Procedure: ESOPHAGOGASTRODUODENOSCOPY (EGD);  Surgeon: [name], MD;  Location: [facility] ENDOSCOPY;  Service: Endoscopy;  Laterality: N/A;  . INGUINAL HERNIA REPAIR    . JOINT REPLACEMENT    . NASAL SINUS SURGERY    . SHOULDER OPEN ROTATOR CUFF REPAIR Right   . TOTAL KNEE ARTHROPLASTY Bilateral     Family History  Problem Relation Age of Onset  . Arthritis-Osteo Sister   . Heart attack Brother   . Heart attack Other     Social History   Social History  . Marital status: Widowed    Spouse name: N/A  . Number of children: N/A  . Years of education: N/A   Occupational History  . Not on file.   Social History Main Topics  . Smoking status: Never Smoker  . Smokeless tobacco: Former Systems developer    Types: Chew     Comment: "quit chewing in the 1960s"  . Alcohol use No  . Drug use: No  . Sexual activity: Not  on file   Other Topics Concern  . Not on file   Social History Narrative  . No narrative on file   Review of systems: Review of Systems  Constitutional: Negative for fever and chills.  HENT: Negative.   Eyes: Negative for blurred vision.  Respiratory: as per HPI  Cardiovascular: Negative for chest pain and palpitations.  Gastrointestinal: Negative for vomiting, diarrhea, blood per rectum. Genitourinary: Negative for dysuria, urgency, frequency and hematuria.  Musculoskeletal: Negative for myalgias, back pain and joint pain.  Skin: Negative for itching and rash.  Neurological: Negative for dizziness, tremors, focal weakness, seizures and loss of consciousness.  Endo/Heme/Allergies: Negative for environmental allergies.  Psychiatric/Behavioral: Negative for depression, suicidal ideas and hallucinations.  All other systems reviewed and are negative.   Physical Exam: Blood pressure 112/86, pulse 83, height '5\' 6"'$  (1.676 m), weight 178 lb (80.7 kg), SpO2 94 %. Gen:      No acute distress HEENT:  EOMI, sclera anicteric Neck:     No masses; no thyromegaly Lungs:    Clear to auscultation bilaterally; normal respiratory effort CV:         Regular rate and rhythm; no murmurs Abd:      + bowel sounds; soft, non-tender; no palpable masses, no distension Ext:    No edema; adequate peripheral perfusion Skin:      Warm and dry; no rash Neuro: alert and oriented x 3 Psych: normal mood and affect  Data Reviewed: C T chest 08/23/11- RLL ground glass opacity 11 mm CT abd 07/13/14- RLL nodular opacity 46m, colitis CXR 10/19/15- No acute abnormality  CT chest 12/15/15- Enlarging right lower lobe opacity. PET scan 12/22/15. Hypermetabolic right lower lobe opacity. Nonspecific uptake in mediastinal, hilar lymph nodes. Images reviewed.  CT biopsy 12/30/15- Adenocarcinoma.  PFTs 01/05/16 FVC 2.23 [109%) FEV1 2.58 (20-29%) F/F 80% TLC 90% DLCO 44%. Severe diffusion defect.  Assessment:    Follow-up for dyspnea, right lower lobe opacity.  Mr. BLlorentehad a CT-guided biopsy of his right lower lobe enlarging opacity. The path results back yesterday shows adenocarcinoma. I reviewed these results with him and his children in office today. His PFTs are okay except for an isolated reduction in diffusion defect. His echo does not show any significant pulmonary hypertension. The PET scan shows non-specific uptake in the hilum, mediastinum and neck  He may be a candidate for resection if his staging does not show any spread to the mediastinal lymph nodes. He has fair functional status. I will discuss his case at the multidisciplinary tumor board to determine the best approach. He will also need MRI head to evaluate for brain mets.   Plan/Recommendations: - Discuss case at tumor board.  - MRI brain  PMarshell GarfinkelMD [facility] Pager [phone]/06/2015, 12:39 PM  CC: PSusy Frizzle MD

## 2016-01-06 ENCOUNTER — Other Ambulatory Visit: Payer: Self-pay | Admitting: Family Medicine

## 2016-01-06 ENCOUNTER — Encounter: Payer: Self-pay | Admitting: Pulmonary Disease

## 2016-01-06 DIAGNOSIS — L309 Dermatitis, unspecified: Secondary | ICD-10-CM

## 2016-01-06 DIAGNOSIS — C3491 Malignant neoplasm of unspecified part of right bronchus or lung: Secondary | ICD-10-CM

## 2016-01-06 HISTORY — DX: Malignant neoplasm of unspecified part of right bronchus or lung: C34.91

## 2016-01-08 ENCOUNTER — Other Ambulatory Visit (INDEPENDENT_AMBULATORY_CARE_PROVIDER_SITE_OTHER): Payer: Commercial Managed Care - HMO

## 2016-01-08 ENCOUNTER — Telehealth: Payer: Self-pay | Admitting: Pulmonary Disease

## 2016-01-08 DIAGNOSIS — C3491 Malignant neoplasm of unspecified part of right bronchus or lung: Secondary | ICD-10-CM

## 2016-01-08 LAB — BASIC METABOLIC PANEL
BUN: 19 mg/dL (ref 6–23)
CO2: 26 mEq/L (ref 19–32)
Calcium: 9.4 mg/dL (ref 8.4–10.5)
Chloride: 107 mEq/L (ref 96–112)
Creatinine, Ser: 1.15 mg/dL (ref 0.40–1.50)
GFR: 64.03 mL/min (ref >60.00)
Glucose, Bld: 106 mg/dL — ABNORMAL HIGH (ref 70–99)
Potassium: 4.2 mEq/L (ref 3.5–5.1)
Sodium: 138 mEq/L (ref 135–145)

## 2016-01-08 MED ORDER — ALPRAZOLAM 0.5 MG PO TABS: 0.5000 mg | ORAL_TABLET | Freq: Once | ORAL | 0 refills | Status: AC

## 2016-01-08 NOTE — Telephone Encounter (Signed)
Orders placed for CT surgery, oncology and MRI Brain. Will sign off

## 2016-01-08 NOTE — Telephone Encounter (Signed)
PM please advise, thank you. Pt's MRI is scheduled for 10.10.17

## 2016-01-08 NOTE — Telephone Encounter (Signed)
Pt's EC is aware of Rx for 1 Xanax tablet to have patient take 1/2 hour prior to procedure. I have called the Rx to [facility] and nothing more needed at this time.

## 2016-01-08 NOTE — Telephone Encounter (Signed)
Xanax 0.5 mg PO 1/2 hr before procedure

## 2016-01-08 NOTE — Addendum Note (Signed)
Addended by: Parke Poisson E on: 01/08/2016 09:38 AM   Modules accepted: Orders

## 2016-01-08 NOTE — Telephone Encounter (Addendum)
I reviewed the case with Dr. Roxan Hockey, [place] surgery. Evan [name] will likely need EBUS for mediastinal staging. We will try to get him to be seen next week in surgery clinic to arrange. In meantime I will also refer to oncology and order MRI brain.   I called Evan [name] daughter Evan [name] and updated her on the plan.  Evan Garfinkel MD [facility] Pager [phone] If no answer or after 3pm call: [phone] 01/08/2016, 9:00 AM

## 2016-01-11 ENCOUNTER — Telehealth: Payer: Self-pay | Admitting: *Deleted

## 2016-01-11 ENCOUNTER — Telehealth: Payer: Self-pay | Admitting: Pulmonary Disease

## 2016-01-11 DIAGNOSIS — C3491 Malignant neoplasm of unspecified part of right bronchus or lung: Secondary | ICD-10-CM

## 2016-01-11 NOTE — Telephone Encounter (Signed)
Spoke with Radiology scheduling- they are aware that PM signed order today at 11:39am.       Office phone Pager E-mail  Ordering User Rinaldo Ratel, [place] -- [email or web address]  Authorizing Provider Marshell Garfinkel, MD [phone] -- --  Entered By Rinaldo Ratel, [place] -- [email or web address]  Ordering Provider Marshell Garfinkel, MD [phone] -- --  Signed By (on 01/11/2016 1139) Marshell Garfinkel, MD [phone] --      Nothing more needed at this time.

## 2016-01-11 NOTE — Telephone Encounter (Signed)
Oncology Nurse Navigator Documentation  Oncology Nurse Navigator Flowsheets 01/11/2016  Navigator Encounter Type Introductory phone call;Telephone/I received referral on Evan [name].  I called and scheduled him on 01/18/16 arrive at 2:45.  This appt was ok with Dr. Julien Nordmann.  Patient verbalized understanding of appt time and place.   Telephone Outgoing Call  Treatment Phase Pre-Tx/Tx Discussion  Barriers/Navigation Needs Coordination of Care  Interventions Coordination of Care  Coordination of Care Appts  Time Spent with Patient 30

## 2016-01-12 ENCOUNTER — Ambulatory Visit (HOSPITAL_COMMUNITY): Admission: RE | Admit: 2016-01-12 | Payer: Commercial Managed Care - HMO | Source: Ambulatory Visit

## 2016-01-13 ENCOUNTER — Telehealth: Payer: Self-pay | Admitting: Family Medicine

## 2016-01-13 NOTE — Telephone Encounter (Signed)
Pt with rt lower lung mass.  Seeing surgeon tomorrow for consult and treatment.  [facility] auth # [id number] Dr Modesto Charon NPI [id number]   Approved for 6 visits from 01/14/16 - 07/12/16.  ICD C34.31

## 2016-01-14 ENCOUNTER — Institutional Professional Consult (permissible substitution) (INDEPENDENT_AMBULATORY_CARE_PROVIDER_SITE_OTHER): Payer: Commercial Managed Care - HMO | Admitting: Thoracic Surgery (Cardiothoracic Vascular Surgery)

## 2016-01-14 ENCOUNTER — Encounter: Payer: Self-pay | Admitting: Thoracic Surgery (Cardiothoracic Vascular Surgery)

## 2016-01-14 VITALS — BP 139/74 | HR 46 | Resp 16 | Ht 66.0 in | Wt 175.0 lb

## 2016-01-14 DIAGNOSIS — C3431 Malignant neoplasm of lower lobe, right bronchus or lung: Secondary | ICD-10-CM | POA: Diagnosis not present

## 2016-01-14 DIAGNOSIS — C801 Malignant (primary) neoplasm, unspecified: Secondary | ICD-10-CM

## 2016-01-14 NOTE — Progress Notes (Signed)
PCP is Odette Fraction, MD Referring Provider is Marshell Garfinkel, MD  Chief Complaint  Patient presents with  . Lung Cancer    RLLobe per CT NB 12/30/15, ECHO 6/5, CT CHEST 9/12, PET 9/19 and PFT 01/05/16    HPI: 82 year old man sent for consultation regarding right lower lobe adenocarcinoma.  Evan [name] is an 82 year old man with a past medical history significant for coronary artery disease status post coronary bypass grafting in 1999 and subsequent coronary stent placement. He also has history of esophageal ulcer, gastroesophageal reflux, degenerative joint disease, chronic back pain, BPH, pneumonia (several times), hypertension, hyperlipidemia, anxiety, and depression.  He has been having complaints of a dry cough, general malaise and fatigue over the past 6 months. He has been having shortness of breath at rest and also with exertion. He gets dizzy when he stands up. He also has had some chest pain. He's been evaluated multiple times. A stress test was a low risk study with no reversible ischemia. Echocardiogram showed normal LV systolic function with mild aortic stenosis. Pulmonary function testing showed supranormal flows but impaired diffusion capacity.  He lives at home. His wife recently was placed in a nursing home. He has a full-time in house caretaker during the day. He is very limited in his activities due to shortness of breath and pain in his legs from arthritis. He complains of easy bruising. He's also had some blurry vision. He is scheduled to have an MR of the brain tomorrow. His appetite is okay and his weight is unchanged over the past 3 and 6 months.  Zubrod Score: At the time of surgery this patient's most appropriate activity status/level should be described as: '[]'$     0    Normal activity, no symptoms '[]'$     1    Restricted in physical strenuous activity but ambulatory, able to do out light work '[x]'$     2    Ambulatory and capable of self care, unable to do work activities,  up and about >50 % of waking hours                              '[]'$     3    Only limited self care, in bed greater than 50% of waking hours '[]'$     4    Completely disabled, no self care, confined to bed or chair '[]'$     5    Moribund    Past Medical History:  Diagnosis Date  . Adenocarcinoma of right lung ([place]) 01/06/2016  . Anginal chest pain at rest [facility])    Chronic non, controlled on Lorazepam  . Anxiety   . Arthritis    "all over"   . BPH (benign prostatic hyperplasia)   . CAD (coronary artery disease)   . Chronic lower back pain   . Complication of anesthesia    "problems making water afterwards"  . Depression   . DJD (degenerative joint disease)   . Esophageal ulcer without bleeding    bid ppi indefinitely  . Family history of adverse reaction to anesthesia    "children w/PONV"  . GERD (gastroesophageal reflux disease)   . Headache    "couple/week maybe" (09/04/2015)  . Hemochromatosis    Possible, elevated iron stores, hook-like osteophytes on hand films, normal LFTs  . Hepatitis    "yellow jaundice as a baby"  . History of ASCVD    MULTIVESSEL  . Hyperlipidemia   .  Hypertension   . Myocardial infarction 1999  . Osteoarthritis   . Pneumonia    "several times; got a little now" (09/04/2015)  . Rheumatoid arthritis [facility])     Past Surgical History:  Procedure Laterality Date  . ARM SKIN LESION BIOPSY / EXCISION Left 09/02/2015  . CATARACT EXTRACTION W/ INTRAOCULAR LENS  IMPLANT, BILATERAL Bilateral   . CHOLECYSTECTOMY OPEN    . CORNEAL TRANSPLANT Bilateral    "one at [facility]; one at [facility]"  . CORONARY ANGIOPLASTY WITH STENT PLACEMENT    . CORONARY ARTERY BYPASS GRAFT  1999  . DESCENDING AORTIC ANEURYSM REPAIR W/ STENT     DE stent ostium into the right radial free graft at [facility]-, 02-2007  . ESOPHAGOGASTRODUODENOSCOPY N/A 11/09/2014   Procedure: ESOPHAGOGASTRODUODENOSCOPY (EGD);  Surgeon: [name], MD;  Location: [facility] ENDOSCOPY;  Service: Endoscopy;  Laterality: N/A;  .  INGUINAL HERNIA REPAIR    . JOINT REPLACEMENT    . NASAL SINUS SURGERY    . SHOULDER OPEN ROTATOR CUFF REPAIR Right   . TOTAL KNEE ARTHROPLASTY Bilateral     Family History  Problem Relation Age of Onset  . Arthritis-Osteo Sister   . Heart attack Brother   . Heart attack Other     Social History Social History  Substance Use Topics  . Smoking status: Never Smoker  . Smokeless tobacco: Former Systems developer    Types: Chew     Comment: "quit chewing in the 1960s"  . Alcohol use No    Current Outpatient Prescriptions  Medication Sig Dispense Refill  . albuterol (PROVENTIL HFA;VENTOLIN HFA) 108 (90 Base) MCG/ACT inhaler Inhale 1-2 puffs into the lungs every 6 (six) hours as needed for wheezing or shortness of breath. 1 Inhaler 1  . atenolol (TENORMIN) 50 MG tablet TAKE 1 TABLET BY MOUTH  EVERY DAY 30 tablet 1  . clopidogrel (PLAVIX) 75 MG tablet Take 1 tablet (75 mg total) by mouth daily. 90 tablet 2  . diclofenac sodium (VOLTAREN) 1 % GEL Apply 2 g topically daily as needed (for pain in knees and feet).    . Fluocinonide Emulsified Base 0.05 % CREA APPLY 1 APPLICATION TOPICALLY TWICE DAILY 30 g 0  . FLUoxetine (PROZAC) 20 MG capsule TAKE ONE CAPSULE BY MOUTH DAILY 90 capsule 3  . HYDROcodone-acetaminophen (NORCO/VICODIN) 5-325 MG tablet Take 1 tablet by mouth as needed for pain.    Marland Kitchen leflunomide (ARAVA) 10 MG tablet Take 10 mg by mouth daily.     Marland Kitchen Lifitegrast (XIIDRA) 5 % SOLN Apply 1 drop to eye 2 (two) times daily. 1 each 3  . LORazepam (ATIVAN) 1 MG tablet TAKE ONE (1) TABLET BY MOUTH 3 TIMES DAILY. TAKE AT 8AM, 12 NOON, AND4PM. 90 tablet 2  . nitroGLYCERIN (NITROSTAT) 0.4 MG SL tablet Place 1 tablet (0.4 mg total) under the tongue every 5 (five) minutes as needed for chest pain. 25 tablet 0  . pantoprazole (PROTONIX) 40 MG tablet Take 1 tablet (40 mg total) by mouth daily. 90 tablet 2  . rosuvastatin (CRESTOR) 5 MG tablet Take 1 tablet (5 mg total) by mouth daily. 90 tablet 2  .  Spacer/Aero-Holding Chambers (AEROCHAMBER PLUS WITH MASK) inhaler Use as instructed 1 each 2  . tamsulosin (FLOMAX) 0.4 MG CAPS capsule Take 0.4 mg by mouth daily after supper.    . traMADol (ULTRAM) 50 MG tablet Take 1 tablet by mouth as needed for pain.    . traZODone (DESYREL) 50 MG tablet Take 50 mg by  mouth at bedtime as needed for sleep.    . Turmeric 500 MG TABS Take 1 tablet by mouth at bedtime.    . Vitamin D, Ergocalciferol, (DRISDOL) 50000 units CAPS capsule Take 50,000 Units by mouth every 7 (seven) days.     No current facility-administered medications for this visit.     Allergies  Allergen Reactions  . Feldene [Piroxicam] Other (See Comments)    REACTION: Blisters  . Statins Other (See Comments)    Myalgias  . Doxycycline Other (See Comments)    REACTION:  unknown  . Latex Rash  . Tequin Anxiety  . Valium Other (See Comments)    REACTION:  unknown    Review of Systems  Constitutional: Positive for activity change and fatigue. Negative for appetite change, chills, fever and unexpected weight change.  HENT: Positive for hearing loss. Negative for trouble swallowing and voice change.   Eyes: Positive for visual disturbance. Negative for photophobia.  Respiratory: Positive for cough and shortness of breath. Negative for wheezing.   Gastrointestinal: Positive for abdominal pain (Reflux) and constipation.  Genitourinary: Positive for frequency. Negative for difficulty urinating and dysuria.       BPH  Musculoskeletal: Positive for arthralgias, back pain and joint swelling.  Neurological: Positive for dizziness and numbness.  Hematological: Negative for adenopathy. Bruises/bleeds easily.  Psychiatric/Behavioral: Positive for dysphoric mood. The patient is nervous/anxious.   All other systems reviewed and are negative.   BP 139/74   Pulse (!) 46   Resp 16   Ht '5\' 6"'$  (1.676 m)   Wt 175 lb (79.4 kg)   SpO2 96% Comment: ON RA  BMI 28.25 kg/m  Physical Exam   Constitutional: He is oriented to person, place, and time. He appears well-developed and well-nourished. No distress.  Extremely hard of hearing  HENT:  Head: Normocephalic and atraumatic.  Eyes: Conjunctivae and EOM are normal. No scleral icterus.  Neck: Neck supple. No thyromegaly present.  Cardiovascular: Normal rate and regular rhythm.   Murmur (Faint systolic) heard. Pulmonary/Chest: Effort normal and breath sounds normal. No respiratory distress. He has no wheezes. He has no rales.  Abdominal: Soft. He exhibits no distension. There is no tenderness.  Musculoskeletal: He exhibits no edema.  Lymphadenopathy:    He has no cervical adenopathy.  Neurological: He is alert and oriented to person, place, and time.  Heart of hearing. No focal motor deficit  Skin: Skin is warm and dry.  Vitals reviewed.    Diagnostic Tests: CT CHEST WITHOUT CONTRAST  TECHNIQUE: Multidetector CT imaging of the chest was performed following the standard protocol without IV contrast.  COMPARISON:  CT the abdomen and pelvis 07/13/2014.  FINDINGS: Cardiovascular: Heart size is mildly enlarged with left atrial dilatation. There is no significant pericardial fluid, thickening or pericardial calcification. There is aortic atherosclerosis, as well as atherosclerosis of the great vessels of the mediastinum and the coronary arteries, including calcified atherosclerotic plaque in the left main, left anterior descending, left circumflex and right coronary arteries. Severe calcifications of the aortic valve.  Mediastinum/Nodes: Borderline enlarged high right paratracheal lymph node measuring 9 mm in short axis is nonspecific. No other definite lymphadenopathy noted in mediastinal or hilar nodal stations. Please note that accurate exclusion of hilar adenopathy is limited on noncontrast CT scans. Esophagus is unremarkable in appearance. No axillary lymphadenopathy.  Lungs/Pleura: Compared to the prior  examination from 07/13/2014 the size of the previously noted right lower lobe nodule has significantly increased, and this is currently pain 3.7  x 1.6 x 2.0 cm mass (axial image 109 of series 3 and sagittal image 59 of series 6) which is centrally solid in appearance with peripheral ground-glass attenuation, and internal air bronchograms, considered very highly suspicious for a primary bronchogenic adenocarcinoma. Septal thickening noted throughout the visualized lung bases, concerning for underlying interstitial lung disease, new compared to the prior examination. No acute consolidative airspace disease. No pleural effusions. Small calcified granuloma in the left lower lobe incidentally noted.  Upper Abdomen: Calcified granuloma in the right lobe of the liver incidentally noted. Status post cholecystectomy.  Musculoskeletal: Mean sternotomy wires. There are no aggressive appearing lytic or blastic lesions noted in the visualized portions of the skeleton.  IMPRESSION: 1. Interval enlargement of what is now a 3.7 x 1.6 x 2.0 cm right lower lobe mass which is highly suspicious for a primary bronchogenic adenocarcinoma. Correlation with PET-CT is strongly recommended for further diagnostic and staging purposes. 2. Borderline enlarged 9 mm high right paratracheal lymph node is nonspecific. Attention at time of follow-up PET-CT is recommended. 3. Spectrum of findings in the lung bases concerning for developing interstitial lung disease. Outpatient referral to Pulmonology for further evaluation is suggested in the near future. 4. There are calcifications of the aortic valve. Echocardiographic correlation for evaluation of potential valvular dysfunction may be warranted if clinically indicated. 5. Mild cardiomegaly with mild left atrial dilatation. 6. Aortic atherosclerosis, in addition to left main and 3 vessel coronary artery disease. Status post median sternotomy for CABG. These  results will be called to the ordering clinician or representative by the Radiologist Assistant, and communication documented in the PACS or zVision Dashboard.   Electronically Signed   By: Vinnie Langton M.D.   On: 12/15/2015 11:27  NUCLEAR MEDICINE PET SKULL BASE TO THIGH  TECHNIQUE: 8.6 mCi F-18 FDG was injected intravenously. Full-ring PET imaging was performed from the skull base to thigh after the radiotracer. CT data was obtained and used for attenuation correction and anatomic localization.  FASTING BLOOD GLUCOSE:  Value: 103 mg/dl  COMPARISON:  12/15/2015 chest CT.  07/13/2014 CT abdomen/pelvis .  FINDINGS: NECK  There is hypermetabolic 0.7 cm right level 2 neck node with max SUV 5.3 (series 4/ image 29). No additional hypermetabolic neck lymph nodes.  CHEST  Predominantly solid 2.9 x 2.6 cm posterior right lower lobe pulmonary nodule with peripheral ground-glass density is mildly hypermetabolic with max SUV 3.9 (series 8/image 45), which is increased from 1.3 x 1.1 cm on 09/02/2011, and is most consistent with primary bronchogenic adenocarcinoma.  No acute consolidative airspace disease or additional significant pulmonary nodules. Patchy subpleural reticulation is again noted throughout both lungs, basilar predominant.  Hypermetabolic 1.0 cm right subcarinal node with max SUV 4.2 (series 4/image 76).  Hypermetabolic right lower paratracheal 0.8 cm node with max SUV 4.6 (series 4/image 66).  Hypermetabolic AP window 0.6 cm node with max SUV 4.8 (series 4/image 65).  Hypermetabolic bilateral hilar adenopathy with max SUV 6.2 on the right and 8.0 on the left.  Mild cardiomegaly. Left main, left anterior descending, left circumflex and right coronary atherosclerosis status post CABG with ascending aortic and left internal mammary bypass grafts. Sternotomy wires appear aligned and intact. Atherosclerotic nonaneurysmal thoracic aorta. No  pleural effusions.  ABDOMEN/PELVIS  No abnormal hypermetabolic activity within the liver, pancreas, adrenal glands, or spleen. No hypermetabolic lymph nodes in the abdomen or pelvis. Cholecystectomy. Moderate sigmoid diverticulosis. Atherosclerotic nonaneurysmal abdominal aorta. There is mild hypermetabolism associated with subcutaneous fat stranding in the  lateral right hip soft tissues. No discrete mass or fluid collection in this location.  SKELETON  No focal hypermetabolic activity to suggest skeletal metastasis. Relatively symmetric hypermetabolism is seen in the bilateral glenohumeral joints with associated osteoarthritis in the bilateral glenohumeral joints.  IMPRESSION: 1. Hypermetabolic (max SUV 3.9) posterior right lower lobe predominantly solid 2.9 cm pulmonary nodule, which is increased in size back to 2013, most consistent with primary bronchogenic adenocarcinoma. 2. Patchy subpleural reticulation throughout both lungs, basilar predominant, suspicious for interstitial lung disease, not well characterized on this study. Consider correlation with high-resolution chest CT in 6-12 months to assess temporal pattern stability, as clinically warranted. 3. Hypermetabolic bilateral hilar and mediastinal lymphadenopathy is highly nonspecific given the suspected underlying interstitial lung disease, and could be reactive or metastatic. 4. Nonspecific hypermetabolic right level 2 neck lymph node, favor reactive node, cannot exclude metastasis. 5. No hypermetabolic osseous or abdominopelvic metastatic disease. 6. Mild hypermetabolism associated with subcutaneous fat stranding in the lateral right hip soft tissues, probably benign, correlate with any history of recent trauma or procedure in this location. 7. Relatively symmetric hypermetabolism in the bilateral glenohumeral joints with associated osteoarthritis, favor degenerative uptake. 8. Additional findings include  mild cardiomegaly, aortic atherosclerosis, coronary atherosclerosis status post CABG and moderate sigmoid diverticulosis.   Electronically Signed   By: Ilona Sorrel M.D.   On: 12/22/2015 15:25  FVC= 3.26(111%) FEV1- 2.61 (131%) DLCO 11.98 (46%)  I personally reviewed the CT and PET/CT and concur with the findings as noted above.  Impression: Evan [name] is an 82 year old never smoker with multiple medical problems who has a newly diagnosed adenocarcinoma of the right lower lobe. He has had a nodule there since 2013. It has grown over time and now is 2.9 cm in maximal dimension. He also has some hilar and mediastinal adenopathy which is mildly hypermetabolic and suspected be due to underlying interstitial lung disease. He has not been formally diagnosed with ILD, but there are findings on the CT that are suspicious.  He likely has stage I disease. There is a possibility that the hilar and mediastinal adenopathy is metastatic disease and he is really stage IIIB.  Although there is no absolute contraindication for surgery for Evan [name], I am reluctant to recommend it. Given his advanced age, limited mobility, and subjective shortness of breath I think he would have a hard time recovering from a lobectomy or even a more limited resection. He would be at high risk for significant morbidity or mortality. Even if he avoided major complications, I think he would have a very difficult time returning to his present living situation.  A better option in his case might be radiation. Whether that would be stereotactic or hypofractionated or conventional would be up to the radiation oncologist.  I discussed the these issues in detail with Evan [name] and his family who accompanied him. He seemed to have difficulty understanding the conversation, but his family did understand the issues involved.  Plan: Refer to radiation oncology.  I spent 45 minutes with Evan [name] during this visit  [name], MD [facility] [phone]

## 2016-01-15 ENCOUNTER — Telehealth: Payer: Self-pay | Admitting: Family Medicine

## 2016-01-15 ENCOUNTER — Ambulatory Visit (HOSPITAL_COMMUNITY)
Admission: RE | Admit: 2016-01-15 | Discharge: 2016-01-15 | Disposition: A | Discharge status: Home / Self Care | Payer: Commercial Managed Care - HMO | Source: Ambulatory Visit | Attending: Pulmonary Disease | Admitting: Pulmonary Disease

## 2016-01-15 DIAGNOSIS — I6782 Cerebral ischemia: Secondary | ICD-10-CM | POA: Diagnosis not present

## 2016-01-15 DIAGNOSIS — C3491 Malignant neoplasm of unspecified part of right bronchus or lung: Secondary | ICD-10-CM

## 2016-01-15 DIAGNOSIS — G9389 Other specified disorders of brain: Secondary | ICD-10-CM | POA: Diagnosis not present

## 2016-01-15 MED ORDER — GADOBENATE DIMEGLUMINE 529 MG/ML IV SOLN
17.0000 mL | Freq: Once | INTRAVENOUS | Status: AC | PRN
  Administered 2016-01-15: 17 mL via INTRAVENOUS

## 2016-01-15 NOTE — Telephone Encounter (Signed)
Pt with Rt lung cancer to see Oncologist on Monday.  Dr Curt Bears    Auth [id number] visits from 01/18/16 - 07/16/16  Dx C34.91

## 2016-01-18 ENCOUNTER — Other Ambulatory Visit: Payer: Commercial Managed Care - HMO

## 2016-01-18 ENCOUNTER — Telehealth: Payer: Self-pay | Admitting: Internal Medicine

## 2016-01-18 ENCOUNTER — Encounter: Payer: Self-pay | Admitting: Internal Medicine

## 2016-01-18 ENCOUNTER — Ambulatory Visit (HOSPITAL_BASED_OUTPATIENT_CLINIC_OR_DEPARTMENT_OTHER): Payer: Commercial Managed Care - HMO | Admitting: Internal Medicine

## 2016-01-18 ENCOUNTER — Telehealth: Payer: Self-pay | Admitting: Medical Oncology

## 2016-01-18 ENCOUNTER — Encounter: Payer: Self-pay | Admitting: *Deleted

## 2016-01-18 VITALS — BP 146/70 | HR 51 | Temp 97.6°F | Resp 17 | Ht 66.0 in | Wt 175.2 lb

## 2016-01-18 DIAGNOSIS — C3491 Malignant neoplasm of unspecified part of right bronchus or lung: Secondary | ICD-10-CM

## 2016-01-18 DIAGNOSIS — C3431 Malignant neoplasm of lower lobe, right bronchus or lung: Secondary | ICD-10-CM | POA: Diagnosis not present

## 2016-01-18 NOTE — Telephone Encounter (Signed)
"  why do I need labs today , I just had labs." I told pt to come to MD appt as scheduled and he can do labs after he sees [place]. Lab appt changed to after visit.

## 2016-01-18 NOTE — Progress Notes (Signed)
Oncology Nurse Navigator Documentation  Oncology Nurse Navigator Flowsheets 01/18/2016  Navigator Location [facility]  Navigator Encounter Type Other/per Dr. Julien Nordmann, he would like [facility] for molecular testing and also PDL 1.  I requested from [facility] pathology to send.   Abnormal Finding Date 12/15/2015  Confirmed Diagnosis Date 12/30/2015  Patient Visit Type MedOnc  Treatment Phase Pre-Tx/Tx Discussion  Barriers/Navigation Needs Coordination of Care  Interventions Coordination of Care  Coordination of Care Other  Acuity Level 2  Acuity Level 2 Other  Time Spent with Patient 30

## 2016-01-18 NOTE — Progress Notes (Signed)
[place] [phone]   [phone]  CONSULT NOTE  REFERRING PHYSICIAN: Dr. Modesto Charon  REASON FOR CONSULTATION:  82 years old white male with lung cancer.  HPI Evan [name] is a 81 y.o. male a never smoker with past medical history significant for multiple medical problems including osteoarthritis, coronary are disease status post CABG, benign prostatic hypertrophy, depression/anxiety, GERD, dyslipidemia, hypertension, myocardial infarction, rheumatoid arthritis. In early September 2017 the patient has been complaining of worsening shortness of breath and chest pain. CT scan of the chest was performed on 12/15/2015 and it showed interval enlargement of 2.7 x 1.6 x 2.0 cm right lower lobe mass highly suspicious for primary bronchogenic carcinoma. There was also borderline enlargement 0.9 cm high right paratracheal lymph node that was nonspecific. A PET scan was performed on 12/22/2015 and it showed predominantly solid 2.9 x 2.6 cm posterior right lower lobe pulmonary nodule with peripheral groundglass density mildly hypermetabolic with maximum SUV of 3.9 which increased from 1.3 x 1.1 cm on 5/31 2013 and this is most consistent with primary bronchogenic adenocarcinoma. There was hypermetabolic bilateral hilar and mediastinal adenopathy highly nonspecific giving the suspected underlying interstitial lung disease and could be reactive for metastatic. There was also nonspecific hypermetabolic right level II lymph nodes favoring a reactive node. On 12/24/2015 the patient underwent CT-guided core biopsy of the right lower lobe lung nodule by interventional radiology. The final pathology (Accession: [id number]) showed adenocarcinoma. MRI of the brain performed on 01/15/2016 showed no evidence for metastatic disease to the brain. The patient was seen by Dr. Roxan Hockey but he is not a good surgical candidate for resection. He was referred to me today for  further evaluation and recommendation regarding treatment of his condition. When seen today the patient is feeling fine except for fatigue and mild shortness breath in the morning. He denied having any significant chest pain, cough or hemoptysis. He denied having any significant weight loss or night sweats. He has no nausea, vomiting or diarrhea but has occasional constipation. He denied having any headache or visual changes. Family history significant for mother died from heart attack and father died from old age. The patient is a widow and has 5 children. He was accompanied today by his daughter, [name] and his son Alvester Chou. 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 He has no history of smoking, alcohol or drug abuse.   HPI  Past Medical History:  Diagnosis Date  . Adenocarcinoma of right lung ([place]) 01/06/2016  . Anginal chest pain at rest [facility])    Chronic non, controlled on Lorazepam  . Anxiety   . Arthritis    "all over"   . BPH (benign prostatic hyperplasia)   . CAD (coronary artery disease)   . Chronic lower back pain   . Complication of anesthesia    "problems making water afterwards"  . Depression   . DJD (degenerative joint disease)   . Esophageal ulcer without bleeding    bid ppi indefinitely  . Family history of adverse reaction to anesthesia    "children w/PONV"  . GERD (gastroesophageal reflux disease)   . Headache    "couple/week maybe" (09/04/2015)  . Hemochromatosis    Possible, elevated iron stores, hook-like osteophytes on hand films, normal LFTs  . Hepatitis    "yellow jaundice as a baby"  . History of ASCVD    MULTIVESSEL  . Hyperlipidemia   . Hypertension   . Myocardial infarction 1999  .  Osteoarthritis   . Pneumonia    "several times; got a little now" (09/04/2015)  . Rheumatoid arthritis [facility])     Past Surgical History:  Procedure Laterality Date  . ARM SKIN LESION BIOPSY / EXCISION Left 09/02/2015  . CATARACT EXTRACTION W/ INTRAOCULAR  LENS  IMPLANT, BILATERAL Bilateral   . CHOLECYSTECTOMY OPEN    . CORNEAL TRANSPLANT Bilateral    "one at [facility]; one at [facility]"  . CORONARY ANGIOPLASTY WITH STENT PLACEMENT    . CORONARY ARTERY BYPASS GRAFT  1999  . DESCENDING AORTIC ANEURYSM REPAIR W/ STENT     DE stent ostium into the right radial free graft at OM-, 02-2007  . ESOPHAGOGASTRODUODENOSCOPY N/A 11/09/2014   Procedure: ESOPHAGOGASTRODUODENOSCOPY (EGD);  Surgeon: [name], MD;  Location: [facility] ENDOSCOPY;  Service: Endoscopy;  Laterality: N/A;  . INGUINAL HERNIA REPAIR    . JOINT REPLACEMENT    . NASAL SINUS SURGERY    . SHOULDER OPEN ROTATOR CUFF REPAIR Right   . TOTAL KNEE ARTHROPLASTY Bilateral     Family History  Problem Relation Age of Onset  . Arthritis-Osteo Sister   . Heart attack Brother   . Heart attack Other     Social History Social History  Substance Use Topics  . Smoking status: Never Smoker  . Smokeless tobacco: Former Systems developer    Types: Chew     Comment: "quit chewing in the 1960s"  . Alcohol use No    Allergies  Allergen Reactions  . Feldene [Piroxicam] Other (See Comments)    REACTION: Blisters  . Statins Other (See Comments)    Myalgias  . Doxycycline Other (See Comments)    REACTION:  unknown  . Latex Rash  . Tequin Anxiety  . Valium Other (See Comments)    REACTION:  unknown    Current Outpatient Prescriptions  Medication Sig Dispense Refill  . albuterol (PROVENTIL HFA;VENTOLIN HFA) 108 (90 Base) MCG/ACT inhaler Inhale 1-2 puffs into the lungs every 6 (six) hours as needed for wheezing or shortness of breath. 1 Inhaler 1  . atenolol (TENORMIN) 50 MG tablet TAKE 1 TABLET BY MOUTH  EVERY DAY 30 tablet 1  . clopidogrel (PLAVIX) 75 MG tablet Take 1 tablet (75 mg total) by mouth daily. 90 tablet 2  . diclofenac sodium (VOLTAREN) 1 % GEL Apply 2 g topically daily as needed (for pain in knees and feet).    . Fluocinonide Emulsified Base 0.05 % CREA APPLY 1 APPLICATION TOPICALLY TWICE DAILY 30 g 0    . FLUoxetine (PROZAC) 20 MG capsule TAKE ONE CAPSULE BY MOUTH DAILY 90 capsule 3  . HYDROcodone-acetaminophen (NORCO/VICODIN) 5-325 MG tablet Take 1 tablet by mouth as needed for pain.    Marland Kitchen leflunomide (ARAVA) 10 MG tablet Take 10 mg by mouth daily.     Marland Kitchen Lifitegrast (XIIDRA) 5 % SOLN Apply 1 drop to eye 2 (two) times daily. 1 each 3  . LORazepam (ATIVAN) 1 MG tablet TAKE ONE (1) TABLET BY MOUTH 3 TIMES DAILY. TAKE AT 8AM, 12 NOON, AND4PM. 90 tablet 2  . pantoprazole (PROTONIX) 40 MG tablet Take 1 tablet (40 mg total) by mouth daily. 90 tablet 2  . rosuvastatin (CRESTOR) 5 MG tablet Take 1 tablet (5 mg total) by mouth daily. 90 tablet 2  . Spacer/Aero-Holding Chambers (AEROCHAMBER PLUS WITH MASK) inhaler Use as instructed 1 each 2  . tamsulosin (FLOMAX) 0.4 MG CAPS capsule Take 0.4 mg by mouth daily after supper.    . traMADol (ULTRAM) 50  MG tablet Take 1 tablet by mouth as needed for pain.    . Turmeric 500 MG TABS Take 1 tablet by mouth at bedtime.    . Vitamin D, Ergocalciferol, (DRISDOL) 50000 units CAPS capsule Take 50,000 Units by mouth every 7 (seven) days.    . nitroGLYCERIN (NITROSTAT) 0.4 MG SL tablet Place 1 tablet (0.4 mg total) under the tongue every 5 (five) minutes as needed for chest pain. (Patient not taking: Reported on 01/18/2016) 25 tablet 0  . traZODone (DESYREL) 50 MG tablet Take 50 mg by mouth at bedtime as needed for sleep.     No current facility-administered medications for this visit.     Review of Systems  Constitutional: positive for fatigue Eyes: negative Ears, nose, mouth, throat, and face: negative Respiratory: positive for dyspnea on exertion Cardiovascular: negative Gastrointestinal: negative Genitourinary:negative Integument/breast: negative Hematologic/lymphatic: negative Musculoskeletal:negative Neurological: negative Behavioral/Psych: negative Endocrine: negative Allergic/Immunologic: negative  Physical Exam  POE:UMPNT, healthy, no  distress, well nourished and well developed SKIN: skin color, texture, turgor are normal, no rashes or significant lesions HEAD: Normocephalic, No masses, lesions, tenderness or abnormalities EYES: normal, PERRLA, Conjunctiva are pink and non-injected EARS: External ears normal, Canals clear OROPHARYNX:no exudate, no erythema and lips, buccal mucosa, and tongue normal  NECK: supple, no adenopathy, no JVD LYMPH:  no palpable lymphadenopathy, no hepatosplenomegaly LUNGS: clear to auscultation , and palpation HEART: regular rate & rhythm, no murmurs and no gallops ABDOMEN:abdomen soft, non-tender, normal bowel sounds and no masses or organomegaly BACK: Back symmetric, no curvature., No CVA tenderness EXTREMITIES:no joint deformities, effusion, or inflammation, no edema, no skin discoloration  NEURO: alert & oriented x 3 with fluent speech, no focal motor/sensory deficits  PERFORMANCE STATUS: ECOG 1  LABORATORY DATA: Lab Results  Component Value Date   WBC 4.1 12/30/2015   HGB 13.2 12/30/2015   HCT 40.5 12/30/2015   MCV 90.8 12/30/2015   PLT 100 (L) 12/30/2015      Chemistry      Component Value Date/Time   NA 138 01/08/2016 1130   K 4.2 01/08/2016 1130   CL 107 01/08/2016 1130   CO2 26 01/08/2016 1130   BUN 19 01/08/2016 1130   CREATININE 1.15 01/08/2016 1130   CREATININE 1.11 11/20/2015 1637      Component Value Date/Time   CALCIUM 9.4 01/08/2016 1130   ALKPHOS 73 11/20/2015 1637   AST 23 11/20/2015 1637   ALT 11 11/20/2015 1637   BILITOT 0.7 11/20/2015 1637       RADIOGRAPHIC STUDIES: Mr Jeri Cos IR Contrast  Result Date: 01/15/2016 CLINICAL DATA:  Evaluation for brain Mets. Right lower lobe adenocarcinoma. EXAM: MRI HEAD WITHOUT AND WITH CONTRAST TECHNIQUE: Multiplanar, multiecho pulse sequences of the brain and surrounding structures were obtained without and with intravenous contrast. CONTRAST:  45m MULTIHANCE GADOBENATE DIMEGLUMINE 529 MG/ML IV SOLN COMPARISON:   Head CT 09/04/2015 FINDINGS: Brain: No acute infarct or intraparenchymal hemorrhage. The midline structures are normal. There is beginning confluent hyperintense T2-weighted signal within the periventricular and deep white matter, most often seen in the setting of chronic microvascular ischemia. No mass lesion or midline shift. No hydrocephalus or extra-axial fluid collection. Vascular: Major intracranial arterial and venous sinus flow voids are preserved. No evidence of chronic microhemorrhage or amyloid angiopathy. Skull and upper cervical spine: There is narrowing of the cervical spinal canal at the C4-5 level, incompletely visualized. No focal calvarial lesions. Sinuses/Orbits: Left maxillary sinus mucosal thickening with postsurgical changes. Normal orbits. IMPRESSION: 1. No  intracranial metastatic disease. 2. Chronic microvascular ischemia. Electronically Signed   By: Ulyses Jarred M.D.   On: 01/15/2016 18:02   Nm Pet Image Initial (pi) Skull Base To Thigh  Result Date: 12/22/2015 CLINICAL DATA:  Initial treatment strategy for enlarging right lower lobe pulmonary nodule. EXAM: NUCLEAR MEDICINE PET SKULL BASE TO THIGH TECHNIQUE: 8.6 mCi F-18 FDG was injected intravenously. Full-ring PET imaging was performed from the skull base to thigh after the radiotracer. CT data was obtained and used for attenuation correction and anatomic localization. FASTING BLOOD GLUCOSE:  Value: 103 mg/dl COMPARISON:  12/15/2015 chest CT.  07/13/2014 CT abdomen/pelvis . FINDINGS: NECK There is hypermetabolic 0.7 cm right level 2 neck node with max SUV 5.3 (series 4/ image 29). No additional hypermetabolic neck lymph nodes. CHEST Predominantly solid 2.9 x 2.6 cm posterior right lower lobe pulmonary nodule with peripheral ground-glass density is mildly hypermetabolic with max SUV 3.9 (series 8/image 45), which is increased from 1.3 x 1.1 cm on 09/02/2011, and is most consistent with primary bronchogenic adenocarcinoma. No acute  consolidative airspace disease or additional significant pulmonary nodules. Patchy subpleural reticulation is again noted throughout both lungs, basilar predominant. Hypermetabolic 1.0 cm right subcarinal node with max SUV 4.2 (series 4/image 76). Hypermetabolic right lower paratracheal 0.8 cm node with max SUV 4.6 (series 4/image 66). Hypermetabolic AP window 0.6 cm node with max SUV 4.8 (series 4/image 65). Hypermetabolic bilateral hilar adenopathy with max SUV 6.2 on the right and 8.0 on the left. Mild cardiomegaly. Left main, left anterior descending, left circumflex and right coronary atherosclerosis status post CABG with ascending aortic and left internal mammary bypass grafts. Sternotomy wires appear aligned and intact. Atherosclerotic nonaneurysmal thoracic aorta. No pleural effusions. ABDOMEN/PELVIS No abnormal hypermetabolic activity within the liver, pancreas, adrenal glands, or spleen. No hypermetabolic lymph nodes in the abdomen or pelvis. Cholecystectomy. Moderate sigmoid diverticulosis. Atherosclerotic nonaneurysmal abdominal aorta. There is mild hypermetabolism associated with subcutaneous fat stranding in the lateral right hip soft tissues. No discrete mass or fluid collection in this location. SKELETON No focal hypermetabolic activity to suggest skeletal metastasis. Relatively symmetric hypermetabolism is seen in the bilateral glenohumeral joints with associated osteoarthritis in the bilateral glenohumeral joints. IMPRESSION: 1. Hypermetabolic (max SUV 3.9) posterior right lower lobe predominantly solid 2.9 cm pulmonary nodule, which is increased in size back to 2013, most consistent with primary bronchogenic adenocarcinoma. 2. Patchy subpleural reticulation throughout both lungs, basilar predominant, suspicious for interstitial lung disease, not well characterized on this study. Consider correlation with high-resolution chest CT in 6-12 months to assess temporal pattern stability, as clinically  warranted. 3. Hypermetabolic bilateral hilar and mediastinal lymphadenopathy is highly nonspecific given the suspected underlying interstitial lung disease, and could be reactive or metastatic. 4. Nonspecific hypermetabolic right level 2 neck lymph node, favor reactive node, cannot exclude metastasis. 5. No hypermetabolic osseous or abdominopelvic metastatic disease. 6. Mild hypermetabolism associated with subcutaneous fat stranding in the lateral right hip soft tissues, probably benign, correlate with any history of recent trauma or procedure in this location. 7. Relatively symmetric hypermetabolism in the bilateral glenohumeral joints with associated osteoarthritis, favor degenerative uptake. 8. Additional findings include mild cardiomegaly, aortic atherosclerosis, coronary atherosclerosis status post CABG and moderate sigmoid diverticulosis. Electronically Signed   By: Ilona Sorrel M.D.   On: 12/22/2015 15:25   Ct Biopsy  Result Date: 12/30/2015 INDICATION: Indeterminate hypermetabolic right lower lobe pulmonary nodule. Please perform CT-guided biopsy for tissue diagnostic purposes. EXAM: CT-GUIDED BIOPSY OF HYPERMETABOLIC RIGHT LOWER LOBE PULMONARY NODULE.  COMPARISON:  Chest CT - 12/15/2015; PET-CT - 12/22/2015 MEDICATIONS: None. ANESTHESIA/SEDATION: Fentanyl 25 mcg IV; Versed 0.5 mg IV Sedation time: 13 minutes; The patient was continuously monitored during the procedure by the interventional radiology nurse under my direct supervision. CONTRAST:  None COMPLICATIONS: None immediate. PROCEDURE: Informed consent was obtained from the patient following an explanation of the procedure, risks, benefits and alternatives. The patient understands,agrees and consents for the procedure. All questions were addressed. A time out was performed prior to the initiation of the procedure. The patient was positioned right lateral decubitus on the CT table and a limited chest CT was performed for procedural planning  demonstrating unchanged size and appearance of the approximately 1.8 x 1.1 cm right lower lobe pulmonary nodule (image 17, series 4). The operative site was prepped and draped in the usual sterile fashion. Under sterile conditions and local anesthesia, a 17 gauge coaxial needle was advanced into the peripheral aspect of the nodule. Positioning was confirmed with intermittent CT fluoroscopy and followed by the acquisition of 3 core needle biopsies with an 18 gauge core needle biopsy device. The coaxial needle was removed following deployment of a Biosentry plug and superficial hemostasis was achieved with manual compression. Limited post procedural chest CT was negative for pneumothorax or additional complication. A dressing was placed. The patient tolerated the procedure well without immediate postprocedural complication. The patient was escorted to have an upright chest radiograph. IMPRESSION: Technically successful CT guided core needle core biopsy of hypermetabolic approximately 1.8 cm right lower lobe pulmonary nodule. Electronically Signed   By: Sandi Mariscal M.D.   On: 12/30/2015 13:27   Dg Chest Port 1 View  Result Date: 12/30/2015 CLINICAL DATA:  82 year old male with right basilar pneumothorax following lung biopsy earlier today. Evaluate for stability. EXAM: PORTABLE CHEST 1 VIEW COMPARISON:  Prior chest x-ray earlier today at 13:32 p.m. FINDINGS: Inspiratory volumes overall are lower. The previously noted right basilar pneumothorax is not clearly seen. Cardiac and mediastinal contours are unchanged. Patient is status post median sternotomy with evidence of prior CABG. No acute osseous abnormality. IMPRESSION: No interval progression of the previously noted right basilar pneumothorax. Overall inspiratory volumes are lower and the pneumothorax is not clearly identifiable and may have resolved. Electronically Signed   By: Jacqulynn Cadet M.D.   On: 12/30/2015 14:44   Dg Chest Port 1 View  Result  Date: 12/30/2015 CLINICAL DATA:  Post biopsy. EXAM: PORTABLE CHEST 1 VIEW COMPARISON:  CT 12/30/2015. FINDINGS: Tiny right base pneumothorax cannot be completely excluded. No tension . Prior CABG. Cardiomegaly. Normal pulmonary vascularity. Low lung volumes with mild bibasilar atelectasis. IMPRESSION: Tiny right base pneumothorax cannot be completely excluded . No tension. Repeat chest x-ray and/or decubitus chest x-ray should be considered for further evaluation . Critical Value/emergent results were called by telephone at the time of interpretation on 12/30/2015 at 1:48 pm to the patient's physician, who verbally acknowledged these results. Electronically Signed   By: Marcello Moores  Register   On: 12/30/2015 13:53    ASSESSMENT: This is a very pleasant 82 years old white male recently diagnosed with a stage Ia (T1b, N0, M0) non-small cell lung cancer, adenocarcinoma presented with right lower lobe lung nodule but the patient also has some nonspecific bilateral hilar lymphadenopathy. These are most likely reactive but malignancy cannot be excluded at this point.   PLAN: I had a lengthy discussion with the patient and his family about his current disease stage, prognosis and treatment options. I showed them the images  of the PET scan and MRI of the brain. The patient is not a good surgical candidate for resection because of his age and comorbidities. I recommended for him to see Dr. Tammi Klippel for consideration of stereotactic radiotherapy to the right lower lobe lung nodule. We will continue to monitor the bilateral hilar lymphadenopathy, as needed on upcoming scan. I will request the tissue block to be sent to [facility] one for molecular studies and PDL 1 expression. I will see the patient back for follow-up visit in 4 months for evaluation with repeat CT scan of the chest for restaging of his disease after the radiotherapy. He was advised to call immediately if he has any concerning symptoms in the interval. I  gave the patient and his family the time to ask questions and I answered them completely to their satisfactions. The patient voices understanding of current disease status and treatment options and is in agreement with the current care plan.  All questions were answered. The patient knows to call the clinic with any problems, questions or concerns. We can certainly see the patient much sooner if necessary.  Thank you so much for allowing me to participate in the care of Evan [name]. I will continue to follow up the patient with you and assist in his care.  I spent 40 minutes counseling the patient face to face. The total time spent in the appointment was 60 minutes.  Disclaimer: This note was dictated with voice recognition software. Similar sounding words can inadvertently be transcribed and may not be corrected upon review.   Lorilei Horan K. January 18, 2016, 4:03 PM

## 2016-01-18 NOTE — Telephone Encounter (Signed)
AVS report and appointment schedule given per 01/18/16 los.

## 2016-01-19 ENCOUNTER — Other Ambulatory Visit (HOSPITAL_COMMUNITY)
Admission: RE | Admit: 2016-01-19 | Discharge: 2016-01-19 | Disposition: A | Discharge status: Home / Self Care | Payer: Commercial Managed Care - HMO | Source: Ambulatory Visit | Attending: Internal Medicine | Admitting: Internal Medicine

## 2016-01-19 DIAGNOSIS — C3431 Malignant neoplasm of lower lobe, right bronchus or lung: Secondary | ICD-10-CM | POA: Diagnosis not present

## 2016-01-22 ENCOUNTER — Telehealth: Payer: Self-pay | Admitting: Family Medicine

## 2016-01-22 NOTE — Progress Notes (Signed)
Thoracic Location of Tumor / Histology: stage Ia (T1b, N0, M0), non small cell lung  Patient presented early summer 2017 complaining of worsening SOB and chest pain. Reports he has been aware since 2011 he had a lesion on his lungs.  Biopsies of right lower lobe nodule (if applicable) revealed: adenocarcinoma  Tobacco/Marijuana/Snuff/ETOH use: never  Past/Anticipated interventions by cardiothoracic surgery, if any: not a surgical cancer due to age  Past/Anticipated interventions by medical oncology, if any: no  Signs/Symptoms  Weight changes, if any: trending down slowly prior to hospitalization 178 lb  Respiratory complaints, if any: Denies chest pain or cough. Reports mild SOB.  Hemoptysis, if any: no  Pain issues, if any:  Reports intermittent pain in the center of his chest which he relates to arthritis or having his "chest cracked years ago for heart surgery."   SAFETY ISSUES:  Prior radiation? No  Pacemaker/ICD?  No but, does have a hx of bypass sx and stent placement       Possible current pregnancy?no  Is the patient on methotrexate? no  Current Complaints / other details:  82 year old male. Widowed with five children. 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 HOH. Intermittent hoarseness. Rare cough with white sputum. Denies hemoptysis. Walk a 1/2 to 1 mile daily. Reports he is able to lay flat without dyspnea. Mohamed referred him to [facility] then, he referred the patient to [facility].

## 2016-01-22 NOTE — Telephone Encounter (Signed)
[facility] needed to see Oncologist Dr Tammi Klippel for his lung cancer Dx C34.91  App # [id number] visits from 01/15/16 - 07/23/16.

## 2016-01-25 ENCOUNTER — Ambulatory Visit
Admission: RE | Admit: 2016-01-25 | Discharge: 2016-01-25 | Disposition: A | Discharge status: Home / Self Care | Payer: Commercial Managed Care - HMO | Source: Ambulatory Visit | Attending: Radiation Oncology | Admitting: Radiation Oncology

## 2016-01-25 ENCOUNTER — Encounter: Payer: Self-pay | Admitting: Radiation Oncology

## 2016-01-25 VITALS — BP 116/68 | HR 53 | Resp 18 | Wt 174.8 lb

## 2016-01-25 DIAGNOSIS — Z955 Presence of coronary angioplasty implant and graft: Secondary | ICD-10-CM | POA: Diagnosis not present

## 2016-01-25 DIAGNOSIS — I119 Hypertensive heart disease without heart failure: Secondary | ICD-10-CM | POA: Insufficient documentation

## 2016-01-25 DIAGNOSIS — Z9889 Other specified postprocedural states: Secondary | ICD-10-CM | POA: Diagnosis not present

## 2016-01-25 DIAGNOSIS — Z8249 Family history of ischemic heart disease and other diseases of the circulatory system: Secondary | ICD-10-CM | POA: Insufficient documentation

## 2016-01-25 DIAGNOSIS — Z951 Presence of aortocoronary bypass graft: Secondary | ICD-10-CM | POA: Insufficient documentation

## 2016-01-25 DIAGNOSIS — M069 Rheumatoid arthritis, unspecified: Secondary | ICD-10-CM | POA: Diagnosis not present

## 2016-01-25 DIAGNOSIS — F418 Other specified anxiety disorders: Secondary | ICD-10-CM | POA: Insufficient documentation

## 2016-01-25 DIAGNOSIS — Z510 Encounter for antineoplastic radiation therapy: Secondary | ICD-10-CM | POA: Insufficient documentation

## 2016-01-25 DIAGNOSIS — K219 Gastro-esophageal reflux disease without esophagitis: Secondary | ICD-10-CM | POA: Insufficient documentation

## 2016-01-25 DIAGNOSIS — N400 Enlarged prostate without lower urinary tract symptoms: Secondary | ICD-10-CM | POA: Insufficient documentation

## 2016-01-25 DIAGNOSIS — I517 Cardiomegaly: Secondary | ICD-10-CM | POA: Diagnosis not present

## 2016-01-25 DIAGNOSIS — I2510 Atherosclerotic heart disease of native coronary artery without angina pectoris: Secondary | ICD-10-CM | POA: Insufficient documentation

## 2016-01-25 DIAGNOSIS — Z9841 Cataract extraction status, right eye: Secondary | ICD-10-CM | POA: Insufficient documentation

## 2016-01-25 DIAGNOSIS — C3491 Malignant neoplasm of unspecified part of right bronchus or lung: Secondary | ICD-10-CM | POA: Diagnosis not present

## 2016-01-25 DIAGNOSIS — Z91040 Latex allergy status: Secondary | ICD-10-CM | POA: Insufficient documentation

## 2016-01-25 DIAGNOSIS — Z9049 Acquired absence of other specified parts of digestive tract: Secondary | ICD-10-CM | POA: Insufficient documentation

## 2016-01-25 DIAGNOSIS — Z96653 Presence of artificial knee joint, bilateral: Secondary | ICD-10-CM | POA: Insufficient documentation

## 2016-01-25 DIAGNOSIS — Z888 Allergy status to other drugs, medicaments and biological substances status: Secondary | ICD-10-CM | POA: Insufficient documentation

## 2016-01-25 DIAGNOSIS — Z9842 Cataract extraction status, left eye: Secondary | ICD-10-CM | POA: Insufficient documentation

## 2016-01-25 DIAGNOSIS — D381 Neoplasm of uncertain behavior of trachea, bronchus and lung: Secondary | ICD-10-CM

## 2016-01-25 DIAGNOSIS — E785 Hyperlipidemia, unspecified: Secondary | ICD-10-CM | POA: Diagnosis not present

## 2016-01-25 DIAGNOSIS — Z881 Allergy status to other antibiotic agents status: Secondary | ICD-10-CM | POA: Insufficient documentation

## 2016-01-25 DIAGNOSIS — C3431 Malignant neoplasm of lower lobe, right bronchus or lung: Secondary | ICD-10-CM | POA: Diagnosis not present

## 2016-01-25 DIAGNOSIS — I219 Acute myocardial infarction, unspecified: Secondary | ICD-10-CM | POA: Diagnosis not present

## 2016-01-25 NOTE — Progress Notes (Signed)
See progress note for physician encounter. 

## 2016-01-25 NOTE — Progress Notes (Signed)
Radiation Oncology         [phone] ________________________________  Initial outpatient Consultation  Name: Evan [name] MRN: [id number]  Date: 01/25/2016  DOB: 1928/02/07  NI:DPOEUMP,NTIRWE TOM, MD  [name], *   REFERRING PHYSICIAN: [name], *  DIAGNOSIS: The encounter diagnosis was Adenocarcinoma of right lung ([place]).    ICD-9-CM ICD-10-CM   1. Adenocarcinoma of right lung (HCC) 162.9 C34.91     HISTORY OF PRESENT ILLNESS: Evan [name] is a 82 y.o. male seen at the request of Dr. Julien Nordmann for a new diagnosis of early stage lung cancer. The patient had been seen in September 2017 complaining of chest pain and worsening shortness of breath. A CT scan of the chest on 12/15/2015 revealed interval enlargement of 2.7 x 1.6 x 2.0 cm right lower lobe mass highly suspicious for primary bronchogenic carcinoma. There was also borderline enlargement 0.9 cm high right paratracheal lymph node that was nonspecific. A PET scan was performed on 12/22/2015 and it showed predominantly solid 2.9 x 2.6 cm posterior right lower lobe pulmonary nodule with peripheral groundglass density mildly hypermetabolic with maximum SUV of 3.9 which increased from 1.3 x 1.1 cm. There was hypermetabolic bilateral hilar and mediastinal adenopathy highly nonspecific giving the suspected underlying interstitial lung disease and could be reactive for metastatic. There was also nonspecific hypermetabolic right level II lymph nodes favoring a reactive node. On 12/24/2015 the patient underwent CT-guided core biopsy of the right lower lobe lung nodule by interventional radiology. The final pathology revealed adenocarcinoma. MRI of the brain performed on 01/15/2016 showed no evidence for metastatic disease to the brain. The patient was seen by Dr. Roxan Hockey but he is not a good surgical candidate for resection, and comes today to discuss the options for SBRT.    PREVIOUS RADIATION THERAPY: No  PAST  MEDICAL HISTORY:  Past Medical History:  Diagnosis Date  . Adenocarcinoma of right lung ([place]) 01/06/2016  . Anginal chest pain at rest [facility])    Chronic non, controlled on Lorazepam  . Anxiety   . Arthritis    "all over"   . BPH (benign prostatic hyperplasia)   . CAD (coronary artery disease)   . Chronic lower back pain   . Complication of anesthesia    "problems making water afterwards"  . Depression   . DJD (degenerative joint disease)   . Esophageal ulcer without bleeding    bid ppi indefinitely  . Family history of adverse reaction to anesthesia    "children w/PONV"  . GERD (gastroesophageal reflux disease)   . Headache    "couple/week maybe" (09/04/2015)  . Hemochromatosis    Possible, elevated iron stores, hook-like osteophytes on hand films, normal LFTs  . Hepatitis    "yellow jaundice as a baby"  . History of ASCVD    MULTIVESSEL  . Hyperlipidemia   . Hypertension   . Myocardial infarction 1999  . Osteoarthritis   . Pneumonia    "several times; got a little now" (09/04/2015)  . Rheumatoid arthritis ([place])       PAST SURGICAL HISTORY: Past Surgical History:  Procedure Laterality Date  . ARM SKIN LESION BIOPSY / EXCISION Left 09/02/2015  . CATARACT EXTRACTION W/ INTRAOCULAR LENS  IMPLANT, BILATERAL Bilateral   . CHOLECYSTECTOMY OPEN    . CORNEAL TRANSPLANT Bilateral    "one at [facility]; one at [facility]"  . CORONARY ANGIOPLASTY WITH STENT PLACEMENT    . CORONARY ARTERY BYPASS GRAFT  1999  . DESCENDING AORTIC  ANEURYSM REPAIR W/ STENT     DE stent ostium into the right radial free graft at OM-, 02-2007  . ESOPHAGOGASTRODUODENOSCOPY N/A 11/09/2014   Procedure: ESOPHAGOGASTRODUODENOSCOPY (EGD);  Surgeon: [name], MD;  Location: [facility] ENDOSCOPY;  Service: Endoscopy;  Laterality: N/A;  . INGUINAL HERNIA REPAIR    . JOINT REPLACEMENT    . NASAL SINUS SURGERY    . SHOULDER OPEN ROTATOR CUFF REPAIR Right   . TOTAL KNEE ARTHROPLASTY Bilateral     FAMILY HISTORY:  Family History    Problem Relation Age of Onset  . Arthritis-Osteo Sister   . Heart attack Brother   . Heart attack Other     SOCIAL HISTORY:  Social History   Social History  . Marital status: Widowed    Spouse name: N/A  . Number of children: N/A  . Years of education: N/A   Occupational History  . Not on file.   Social History Main Topics  . Smoking status: Never Smoker  . Smokeless tobacco: Former Systems developer    Types: Chew     Comment: "quit chewing in the 1960s"  . Alcohol use No  . Drug use: No  . Sexual activity: Not on file   Other Topics Concern  . Not on file   Social History Narrative  . No narrative on file  The patient lives in [place]. His daughter and son accompany him today.  ALLERGIES: Feldene [piroxicam]; Statins; Doxycycline; Latex; Tequin; and Valium  MEDICATIONS:  Current Outpatient Prescriptions  Medication Sig Dispense Refill  . albuterol (PROVENTIL HFA;VENTOLIN HFA) 108 (90 Base) MCG/ACT inhaler Inhale 1-2 puffs into the lungs every 6 (six) hours as needed for wheezing or shortness of breath. 1 Inhaler 1  . atenolol (TENORMIN) 50 MG tablet TAKE 1 TABLET BY MOUTH  EVERY DAY 30 tablet 1  . clopidogrel (PLAVIX) 75 MG tablet Take 1 tablet (75 mg total) by mouth daily. 90 tablet 2  . diclofenac sodium (VOLTAREN) 1 % GEL Apply 2 g topically daily as needed (for pain in knees and feet).    . Fluocinonide Emulsified Base 0.05 % CREA APPLY 1 APPLICATION TOPICALLY TWICE DAILY 30 g 0  . FLUoxetine (PROZAC) 20 MG capsule TAKE ONE CAPSULE BY MOUTH DAILY 90 capsule 3  . HYDROcodone-acetaminophen (NORCO/VICODIN) 5-325 MG tablet Take 1 tablet by mouth as needed for pain.    Marland Kitchen leflunomide (ARAVA) 10 MG tablet Take 10 mg by mouth daily.     Marland Kitchen Lifitegrast (XIIDRA) 5 % SOLN Apply 1 drop to eye 2 (two) times daily. 1 each 3  . LORazepam (ATIVAN) 1 MG tablet TAKE ONE (1) TABLET BY MOUTH 3 TIMES DAILY. TAKE AT 8AM, 12 NOON, AND4PM. 90 tablet 2  . nitroGLYCERIN (NITROSTAT) 0.4 MG SL  tablet Place 1 tablet (0.4 mg total) under the tongue every 5 (five) minutes as needed for chest pain. (Patient not taking: Reported on 01/18/2016) 25 tablet 0  . pantoprazole (PROTONIX) 40 MG tablet Take 1 tablet (40 mg total) by mouth daily. 90 tablet 2  . rosuvastatin (CRESTOR) 5 MG tablet Take 1 tablet (5 mg total) by mouth daily. 90 tablet 2  . Spacer/Aero-Holding Chambers (AEROCHAMBER PLUS WITH MASK) inhaler Use as instructed 1 each 2  . tamsulosin (FLOMAX) 0.4 MG CAPS capsule Take 0.4 mg by mouth daily after supper.    . traMADol (ULTRAM) 50 MG tablet Take 1 tablet by mouth as needed for pain.    . traZODone (DESYREL) 50 MG tablet Take  50 mg by mouth at bedtime as needed for sleep.    . Turmeric 500 MG TABS Take 1 tablet by mouth at bedtime.    . Vitamin D, Ergocalciferol, (DRISDOL) 50000 units CAPS capsule Take 50,000 Units by mouth every 7 (seven) days.     No current facility-administered medications for this encounter.     REVIEW OF SYSTEMS:  On review of systems, the patient reports that he is doing well overall. He denies any cough, hemoptysis, fevers, chills, night sweats, unintended weight changes. Patient reports mild SOB and intermittent hoarseness. He reports he can lay flat without any dyspnea. He denies any bowel or bladder disturbances, and denies abdominal pain, nausea or vomiting.Patient reports intermittent pain in the center of his chest which he attributes to arthritis or having his "chest cracked years ago for heart surgery." Patient walks 1/2 to 1 mile daily without shortness of breath, but if he exerts himself more than this, he does have some shortness of breath. A complete review of systems is obtained and is otherwise negative.    PHYSICAL EXAM:  Wt Readings from Last 3 Encounters:  01/18/16 175 lb 3.2 oz (79.5 kg)  01/14/16 175 lb (79.4 kg)  01/05/16 177 lb (80.3 kg)   Temp Readings from Last 3 Encounters:  01/18/16 97.6 F (36.4 C) (Oral)  12/30/15 97.2 F  (36.2 C)  12/29/15 97.6 F (36.4 C) (Oral)   BP Readings from Last 3 Encounters:  01/18/16 (!) 146/70  01/14/16 139/74  01/05/16 138/64   Pulse Readings from Last 3 Encounters:  01/18/16 (!) 51  01/14/16 (!) 46  01/05/16 67    In general this is a well appearing Caucasian gentleman in no acute distress. He is alert and oriented x4 and appropriate throughout the examination. HEENT reveals that the patient is normocephalic, atraumatic. EOMs are intact. PERRLA. Skin is intact without any evidence of gross lesions. Cardiovascular exam reveals a regular rate and rhythm, no clicks rubs or murmurs are auscultated. Chest is clear to auscultation bilaterally. Lymphatic assessment is performed and does not reveal any adenopathy in the cervical, supraclavicular, axillary, or inguinal chains. Abdomen has active bowel sounds in all quadrants and is intact. The abdomen is soft, non tender, non distended. Lower extremities are negative for pretibial pitting edema, deep calf tenderness, cyanosis or clubbing.   KPS = 80  100 - Normal; no complaints; no evidence of disease. 90   - Able to carry on normal activity; minor signs or symptoms of disease. 80   - Normal activity with effort; some signs or symptoms of disease. 37   - Cares for self; unable to carry on normal activity or to do active work. 60   - Requires occasional assistance, but is able to care for most of his personal needs. 50   - Requires considerable assistance and frequent medical care. 33   - Disabled; requires special care and assistance. 80   - Severely disabled; hospital admission is indicated although death not imminent. 74   - Very sick; hospital admission necessary; active supportive treatment necessary. 10   - Moribund; fatal processes progressing rapidly. 0     - Dead  Karnofsky DA, Abelmann Hopkins, Craver LS and [facility] [phone]) The use of the nitrogen mustards in the palliative treatment of carcinoma: with particular  reference to bronchogenic carcinoma Cancer 1 634-56  LABORATORY DATA:  Lab Results  Component Value Date   WBC 4.1 12/30/2015   HGB 13.2 12/30/2015   HCT  40.5 12/30/2015   MCV 90.8 12/30/2015   PLT 100 (L) 12/30/2015   Lab Results  Component Value Date   NA 138 01/08/2016   K 4.2 01/08/2016   CL 107 01/08/2016   CO2 26 01/08/2016   Lab Results  Component Value Date   ALT 11 11/20/2015   AST 23 11/20/2015   ALKPHOS 73 11/20/2015   BILITOT 0.7 11/20/2015     RADIOGRAPHY: Mr Jeri Cos RD Contrast  Result Date: 01/15/2016 CLINICAL DATA:  Evaluation for brain Mets. Right lower lobe adenocarcinoma. EXAM: MRI HEAD WITHOUT AND WITH CONTRAST TECHNIQUE: Multiplanar, multiecho pulse sequences of the brain and surrounding structures were obtained without and with intravenous contrast. CONTRAST:  26m MULTIHANCE GADOBENATE DIMEGLUMINE 529 MG/ML IV SOLN COMPARISON:  Head CT 09/04/2015 FINDINGS: Brain: No acute infarct or intraparenchymal hemorrhage. The midline structures are normal. There is beginning confluent hyperintense T2-weighted signal within the periventricular and deep white matter, most often seen in the setting of chronic microvascular ischemia. No mass lesion or midline shift. No hydrocephalus or extra-axial fluid collection. Vascular: Major intracranial arterial and venous sinus flow voids are preserved. No evidence of chronic microhemorrhage or amyloid angiopathy. Skull and upper cervical spine: There is narrowing of the cervical spinal canal at the C4-5 level, incompletely visualized. No focal calvarial lesions. Sinuses/Orbits: Left maxillary sinus mucosal thickening with postsurgical changes. Normal orbits. IMPRESSION: 1. No intracranial metastatic disease. 2. Chronic microvascular ischemia. Electronically Signed   By: KUlyses JarredM.D.   On: 01/15/2016 18:02   Ct Biopsy  Result Date: 12/30/2015 INDICATION: Indeterminate hypermetabolic right lower lobe pulmonary nodule. Please  perform CT-guided biopsy for tissue diagnostic purposes. EXAM: CT-GUIDED BIOPSY OF HYPERMETABOLIC RIGHT LOWER LOBE PULMONARY NODULE. COMPARISON:  Chest CT - 12/15/2015; PET-CT - 12/22/2015 MEDICATIONS: None. ANESTHESIA/SEDATION: Fentanyl 25 mcg IV; Versed 0.5 mg IV Sedation time: 13 minutes; The patient was continuously monitored during the procedure by the interventional radiology nurse under my direct supervision. CONTRAST:  None COMPLICATIONS: None immediate. PROCEDURE: Informed consent was obtained from the patient following an explanation of the procedure, risks, benefits and alternatives. The patient understands,agrees and consents for the procedure. All questions were addressed. A time out was performed prior to the initiation of the procedure. The patient was positioned right lateral decubitus on the CT table and a limited chest CT was performed for procedural planning demonstrating unchanged size and appearance of the approximately 1.8 x 1.1 cm right lower lobe pulmonary nodule (image 17, series 4). The operative site was prepped and draped in the usual sterile fashion. Under sterile conditions and local anesthesia, a 17 gauge coaxial needle was advanced into the peripheral aspect of the nodule. Positioning was confirmed with intermittent CT fluoroscopy and followed by the acquisition of 3 core needle biopsies with an 18 gauge core needle biopsy device. The coaxial needle was removed following deployment of a Biosentry plug and superficial hemostasis was achieved with manual compression. Limited post procedural chest CT was negative for pneumothorax or additional complication. A dressing was placed. The patient tolerated the procedure well without immediate postprocedural complication. The patient was escorted to have an upright chest radiograph. IMPRESSION: Technically successful CT guided core needle core biopsy of hypermetabolic approximately 1.8 cm right lower lobe pulmonary nodule. Electronically  Signed   By: JSandi MariscalM.D.   On: 12/30/2015 13:27   Dg Chest Port 1 View  Result Date: 12/30/2015 CLINICAL DATA:  82year old male with right basilar pneumothorax following lung biopsy earlier today. Evaluate for stability.  EXAM: PORTABLE CHEST 1 VIEW COMPARISON:  Prior chest x-ray earlier today at 13:32 p.m. FINDINGS: Inspiratory volumes overall are lower. The previously noted right basilar pneumothorax is not clearly seen. Cardiac and mediastinal contours are unchanged. Patient is status post median sternotomy with evidence of prior CABG. No acute osseous abnormality. IMPRESSION: No interval progression of the previously noted right basilar pneumothorax. Overall inspiratory volumes are lower and the pneumothorax is not clearly identifiable and may have resolved. Electronically Signed   By: Jacqulynn Cadet M.D.   On: 12/30/2015 14:44   Dg Chest Port 1 View  Result Date: 12/30/2015 CLINICAL DATA:  Post biopsy. EXAM: PORTABLE CHEST 1 VIEW COMPARISON:  CT 12/30/2015. FINDINGS: Tiny right base pneumothorax cannot be completely excluded. No tension . Prior CABG. Cardiomegaly. Normal pulmonary vascularity. Low lung volumes with mild bibasilar atelectasis. IMPRESSION: Tiny right base pneumothorax cannot be completely excluded . No tension. Repeat chest x-ray and/or decubitus chest x-ray should be considered for further evaluation . Critical Value/emergent results were called by telephone at the time of interpretation on 12/30/2015 at 1:48 pm to the patient's physician, who verbally acknowledged these results. Electronically Signed   By: Marcello Moores  Register   On: 12/30/2015 13:53      IMPRESSION/PLAN: 1. 82 y.o. gentleman with stage IA, T1b, N0, M0, NSCLC, adenocarcinoma of the right lung. Dr. Tammi Klippel met with the patient and family about the findings and work-up thus far. He reviewed the patient's PET scan and discussed the primary tumor's location and size as well as the reactive appearing adenopathy. He  discusses that he would also agree that it would be advised to follow this closely on subsequent imaging.  We discussed the natural history of early stage unresectable lung cancer and general treatment, highlighting the role of stereotactic body radiation treatment in the management. Dr. Tammi Klippel recommends between 3-5 fractions to the right lung. We discussed the available radiation techniques, and focused on the details of logistics and delivery.  We reviewed the anticipated acute and late sequelae associated with radiation in this setting.  The patient would like to proceed with radiation and will be scheduled for CT simulation.  The above documentation reflects my direct findings during this shared patient visit. Please see the separate note by Dr. Tammi Klippel on this date for the remainder of the patient's plan of care.   Carola Rhine, PAC    This document serves as a record of services personally performed by Tyler Pita, MD and Shona Simpson, PA. It was created on his behalf by Maryla Morrow, a trained medical scribe. The creation of this record is based on the scribe's personal observations and the provider's statements to them. This document has been checked and approved by the attending provider.

## 2016-01-26 ENCOUNTER — Encounter (HOSPITAL_COMMUNITY): Payer: Self-pay

## 2016-01-26 NOTE — Addendum Note (Signed)
Encounter addended by: Heywood Footman, RN on: 01/26/2016  2:50 PM<BR>    Actions taken: Charge Capture section accepted

## 2016-01-28 NOTE — Progress Notes (Signed)
  Radiation Oncology         [phone] ________________________________  Name: DEFORREST BOGLE MRN: [id number]  Date: 01/29/2016  DOB: [birth year]  STEREOTACTIC BODY RADIOTHERAPY SIMULATION AND TREATMENT PLANNING NOTE    ICD-9-CM ICD-10-CM   1. Adenocarcinoma of right lung (HCC) 162.9 C34.91     DIAGNOSIS:  82 yo man with clinical stage I Adenocarcinoma of the right lower lung   NARRATIVE:  The patient was brought to the [facility].  Identity was confirmed.  All relevant records and images related to the planned course of therapy were reviewed.  The patient freely provided informed written consent to proceed with treatment after reviewing the details related to the planned course of therapy. The consent form was witnessed and verified by the simulation staff.  Then, the patient was set-up in a stable reproducible  supine position for radiation therapy.  A BodyFix immobilization pillow was fabricated for reproducible positioning.  Then I personally applied the abdominal compression paddle to limit respiratory excursion.  4D respiratoy motion management CT images were obtained.  Surface markings were placed.  The CT images were loaded into the planning software.  Then, using Cine, MIP, and standard views, the internal target volume (ITV) and planning target volumes (PTV) were delinieated, and avoidance structures were contoured.  Treatment planning then occurred.  The radiation prescription was entered and confirmed.  A total of two complex treatment devices were fabricated in the form of the BodyFix immobilization pillow and a neck accuform cushion.  I have requested : 3D Simulation  I have requested a DVH of the following structures: Heart, Lungs, Esophagus, Chest Wall, Brachial Plexus, Major Blood Vessels, and targets.  SPECIAL TREATMENT PROCEDURE:  The planned course of therapy using radiation constitutes a special treatment procedure. Special care is required in the management of  this patient for the following reasons. This treatment constitutes a Special Treatment Procedure for the following reason: [ High dose per fraction requiring special monitoring for increased toxicities of treatment including daily imaging..  The special nature of the planned course of radiotherapy will require increased physician supervision and oversight to ensure patient's safety with optimal treatment outcomes.  RESPIRATORY MOTION MANAGEMENT SIMULATION:  In order to account for effect of respiratory motion on target structures and other organs in the planning and delivery of radiotherapy, this patient underwent respiratory motion management simulation.  To accomplish this, when the patient was brought to the CT simulation planning suite, 4D respiratoy motion management CT images were obtained.  The CT images were loaded into the planning software.  Then, using a variety of tools including Cine, MIP, and standard views, the target volume and planning target volumes (PTV) were delineated.  Avoidance structures were contoured.  Treatment planning then occurred.  Dose volume histograms were generated and reviewed for each of the requested structure.  The resulting plan was carefully reviewed and approved today.  PLAN:  The patient will receive 54 Gy in 3 fractions.  ________________________________  Sheral Apley Tammi Klippel, M.D.

## 2016-01-29 ENCOUNTER — Ambulatory Visit
Admission: RE | Admit: 2016-01-29 | Discharge: 2016-01-29 | Disposition: A | Discharge status: Home / Self Care | Payer: Commercial Managed Care - HMO | Source: Ambulatory Visit | Attending: Radiation Oncology | Admitting: Radiation Oncology

## 2016-01-29 DIAGNOSIS — I119 Hypertensive heart disease without heart failure: Secondary | ICD-10-CM | POA: Diagnosis not present

## 2016-01-29 DIAGNOSIS — K219 Gastro-esophageal reflux disease without esophagitis: Secondary | ICD-10-CM | POA: Diagnosis not present

## 2016-01-29 DIAGNOSIS — C3431 Malignant neoplasm of lower lobe, right bronchus or lung: Secondary | ICD-10-CM | POA: Diagnosis not present

## 2016-01-29 DIAGNOSIS — F418 Other specified anxiety disorders: Secondary | ICD-10-CM | POA: Diagnosis not present

## 2016-01-29 DIAGNOSIS — E785 Hyperlipidemia, unspecified: Secondary | ICD-10-CM | POA: Diagnosis not present

## 2016-01-29 DIAGNOSIS — Z510 Encounter for antineoplastic radiation therapy: Secondary | ICD-10-CM | POA: Diagnosis not present

## 2016-01-29 DIAGNOSIS — I219 Acute myocardial infarction, unspecified: Secondary | ICD-10-CM | POA: Diagnosis not present

## 2016-01-29 DIAGNOSIS — I2510 Atherosclerotic heart disease of native coronary artery without angina pectoris: Secondary | ICD-10-CM | POA: Diagnosis not present

## 2016-01-29 DIAGNOSIS — I517 Cardiomegaly: Secondary | ICD-10-CM | POA: Diagnosis not present

## 2016-01-29 DIAGNOSIS — C3491 Malignant neoplasm of unspecified part of right bronchus or lung: Secondary | ICD-10-CM | POA: Diagnosis not present

## 2016-02-04 ENCOUNTER — Other Ambulatory Visit: Payer: Self-pay | Admitting: Family Medicine

## 2016-02-04 ENCOUNTER — Telehealth: Payer: Self-pay | Admitting: *Deleted

## 2016-02-04 MED ORDER — CHOLECALCIFEROL 50 MCG (2000 UT) PO TABS
1.0000 | ORAL_TABLET | Freq: Every day | ORAL | 11 refills | Status: DC
Start: 2016-02-04 — End: 2018-08-15

## 2016-02-04 MED ORDER — LORAZEPAM 1 MG PO TABS: ORAL_TABLET | ORAL | 2 refills | Status: DC

## 2016-02-04 NOTE — Telephone Encounter (Signed)
ok 

## 2016-02-04 NOTE — Telephone Encounter (Signed)
Medication called to pharmacy. 

## 2016-02-04 NOTE — Telephone Encounter (Signed)
Patient in office requesting refill on Ativan to [facility] in [place].   Ok to refill?  Last refill 11/02/2015, #2 refills.

## 2016-02-04 NOTE — Telephone Encounter (Signed)
Patient needs refill on his ativan

## 2016-02-04 NOTE — Telephone Encounter (Signed)
Refill denied.   Prescription order on 09/23/2015 states vit D 50,000 q week x3 months.   Patient should be on OTC vit D.

## 2016-02-05 ENCOUNTER — Encounter (HOSPITAL_COMMUNITY): Payer: Self-pay

## 2016-02-08 DIAGNOSIS — F418 Other specified anxiety disorders: Secondary | ICD-10-CM | POA: Diagnosis not present

## 2016-02-08 DIAGNOSIS — C3431 Malignant neoplasm of lower lobe, right bronchus or lung: Secondary | ICD-10-CM | POA: Diagnosis not present

## 2016-02-08 DIAGNOSIS — I219 Acute myocardial infarction, unspecified: Secondary | ICD-10-CM | POA: Diagnosis not present

## 2016-02-08 DIAGNOSIS — E785 Hyperlipidemia, unspecified: Secondary | ICD-10-CM | POA: Diagnosis not present

## 2016-02-08 DIAGNOSIS — Z510 Encounter for antineoplastic radiation therapy: Secondary | ICD-10-CM | POA: Diagnosis not present

## 2016-02-08 DIAGNOSIS — K219 Gastro-esophageal reflux disease without esophagitis: Secondary | ICD-10-CM | POA: Diagnosis not present

## 2016-02-08 DIAGNOSIS — I2510 Atherosclerotic heart disease of native coronary artery without angina pectoris: Secondary | ICD-10-CM | POA: Diagnosis not present

## 2016-02-08 DIAGNOSIS — I119 Hypertensive heart disease without heart failure: Secondary | ICD-10-CM | POA: Diagnosis not present

## 2016-02-08 DIAGNOSIS — C3491 Malignant neoplasm of unspecified part of right bronchus or lung: Secondary | ICD-10-CM | POA: Diagnosis not present

## 2016-02-08 DIAGNOSIS — I517 Cardiomegaly: Secondary | ICD-10-CM | POA: Diagnosis not present

## 2016-02-15 ENCOUNTER — Ambulatory Visit
Admission: RE | Admit: 2016-02-15 | Discharge: 2016-02-15 | Disposition: A | Discharge status: Home / Self Care | Payer: Commercial Managed Care - HMO | Source: Ambulatory Visit | Attending: Radiation Oncology | Admitting: Radiation Oncology

## 2016-02-15 DIAGNOSIS — F418 Other specified anxiety disorders: Secondary | ICD-10-CM | POA: Diagnosis not present

## 2016-02-15 DIAGNOSIS — E785 Hyperlipidemia, unspecified: Secondary | ICD-10-CM | POA: Diagnosis not present

## 2016-02-15 DIAGNOSIS — I219 Acute myocardial infarction, unspecified: Secondary | ICD-10-CM | POA: Diagnosis not present

## 2016-02-15 DIAGNOSIS — K219 Gastro-esophageal reflux disease without esophagitis: Secondary | ICD-10-CM | POA: Diagnosis not present

## 2016-02-15 DIAGNOSIS — Z510 Encounter for antineoplastic radiation therapy: Secondary | ICD-10-CM | POA: Diagnosis not present

## 2016-02-15 DIAGNOSIS — I2510 Atherosclerotic heart disease of native coronary artery without angina pectoris: Secondary | ICD-10-CM | POA: Diagnosis not present

## 2016-02-15 DIAGNOSIS — I517 Cardiomegaly: Secondary | ICD-10-CM | POA: Diagnosis not present

## 2016-02-15 DIAGNOSIS — I119 Hypertensive heart disease without heart failure: Secondary | ICD-10-CM | POA: Diagnosis not present

## 2016-02-15 DIAGNOSIS — C3491 Malignant neoplasm of unspecified part of right bronchus or lung: Secondary | ICD-10-CM | POA: Diagnosis not present

## 2016-02-16 ENCOUNTER — Telehealth: Payer: Self-pay | Admitting: *Deleted

## 2016-02-16 ENCOUNTER — Ambulatory Visit: Payer: Commercial Managed Care - HMO | Admitting: Radiation Oncology

## 2016-02-16 NOTE — Telephone Encounter (Signed)
Oncology Nurse Navigator Documentation  Oncology Nurse Navigator Flowsheets 02/16/2016  Navigator Location [facility]  Navigator Encounter Type Telephone/I called Evan [name] to check on him.  He received his first SBRT TX this week.  I called but was unable to reach.   Telephone Outgoing Call  Abnormal Finding Date 12/15/2015  Confirmed Diagnosis Date 12/30/2015  Treatment Initiated Date 01/29/2016  Treatment Phase Treatment  Acuity Level 1  Time Spent with Patient 15

## 2016-02-17 ENCOUNTER — Ambulatory Visit
Admission: RE | Admit: 2016-02-17 | Discharge: 2016-02-17 | Disposition: A | Discharge status: Home / Self Care | Payer: Commercial Managed Care - HMO | Source: Ambulatory Visit | Attending: Radiation Oncology | Admitting: Radiation Oncology

## 2016-02-17 ENCOUNTER — Other Ambulatory Visit: Payer: Self-pay | Admitting: Family Medicine

## 2016-02-17 DIAGNOSIS — I517 Cardiomegaly: Secondary | ICD-10-CM | POA: Diagnosis not present

## 2016-02-17 DIAGNOSIS — E785 Hyperlipidemia, unspecified: Secondary | ICD-10-CM | POA: Diagnosis not present

## 2016-02-17 DIAGNOSIS — I119 Hypertensive heart disease without heart failure: Secondary | ICD-10-CM | POA: Diagnosis not present

## 2016-02-17 DIAGNOSIS — C3491 Malignant neoplasm of unspecified part of right bronchus or lung: Secondary | ICD-10-CM | POA: Diagnosis not present

## 2016-02-17 DIAGNOSIS — F418 Other specified anxiety disorders: Secondary | ICD-10-CM | POA: Diagnosis not present

## 2016-02-17 DIAGNOSIS — K219 Gastro-esophageal reflux disease without esophagitis: Secondary | ICD-10-CM | POA: Diagnosis not present

## 2016-02-17 DIAGNOSIS — I219 Acute myocardial infarction, unspecified: Secondary | ICD-10-CM | POA: Diagnosis not present

## 2016-02-17 DIAGNOSIS — Z510 Encounter for antineoplastic radiation therapy: Secondary | ICD-10-CM | POA: Diagnosis not present

## 2016-02-17 DIAGNOSIS — I2510 Atherosclerotic heart disease of native coronary artery without angina pectoris: Secondary | ICD-10-CM | POA: Diagnosis not present

## 2016-02-18 ENCOUNTER — Other Ambulatory Visit: Payer: Self-pay | Admitting: Family Medicine

## 2016-02-18 ENCOUNTER — Ambulatory Visit: Payer: Commercial Managed Care - HMO | Admitting: Radiation Oncology

## 2016-02-18 MED ORDER — ALBUTEROL SULFATE HFA 108 (90 BASE) MCG/ACT IN AERS: 1.0000 | INHALATION_SPRAY | Freq: Four times a day (QID) | RESPIRATORY_TRACT | 2 refills | Status: DC | PRN

## 2016-02-19 ENCOUNTER — Ambulatory Visit
Admission: RE | Admit: 2016-02-19 | Discharge: 2016-02-19 | Disposition: A | Discharge status: Home / Self Care | Payer: Commercial Managed Care - HMO | Source: Ambulatory Visit | Attending: Radiation Oncology | Admitting: Radiation Oncology

## 2016-02-19 DIAGNOSIS — Z510 Encounter for antineoplastic radiation therapy: Secondary | ICD-10-CM | POA: Diagnosis not present

## 2016-02-19 DIAGNOSIS — I517 Cardiomegaly: Secondary | ICD-10-CM | POA: Diagnosis not present

## 2016-02-19 DIAGNOSIS — I219 Acute myocardial infarction, unspecified: Secondary | ICD-10-CM | POA: Diagnosis not present

## 2016-02-19 DIAGNOSIS — C3491 Malignant neoplasm of unspecified part of right bronchus or lung: Secondary | ICD-10-CM | POA: Diagnosis not present

## 2016-02-19 DIAGNOSIS — E785 Hyperlipidemia, unspecified: Secondary | ICD-10-CM | POA: Diagnosis not present

## 2016-02-19 DIAGNOSIS — I119 Hypertensive heart disease without heart failure: Secondary | ICD-10-CM | POA: Diagnosis not present

## 2016-02-19 DIAGNOSIS — I2510 Atherosclerotic heart disease of native coronary artery without angina pectoris: Secondary | ICD-10-CM | POA: Diagnosis not present

## 2016-02-19 DIAGNOSIS — K219 Gastro-esophageal reflux disease without esophagitis: Secondary | ICD-10-CM | POA: Diagnosis not present

## 2016-02-19 DIAGNOSIS — F418 Other specified anxiety disorders: Secondary | ICD-10-CM | POA: Diagnosis not present

## 2016-02-21 ENCOUNTER — Ambulatory Visit: Payer: Commercial Managed Care - HMO | Admitting: Radiation Oncology

## 2016-02-22 ENCOUNTER — Encounter: Payer: Self-pay | Admitting: Radiation Oncology

## 2016-02-22 ENCOUNTER — Ambulatory Visit
Admission: RE | Admit: 2016-02-22 | Discharge: 2016-02-22 | Disposition: A | Discharge status: Home / Self Care | Payer: Commercial Managed Care - HMO | Source: Ambulatory Visit | Attending: Radiation Oncology | Admitting: Radiation Oncology

## 2016-02-22 VITALS — BP 152/74 | HR 56 | Temp 97.7°F | Ht 66.0 in | Wt 174.4 lb

## 2016-02-22 DIAGNOSIS — I517 Cardiomegaly: Secondary | ICD-10-CM | POA: Diagnosis not present

## 2016-02-22 DIAGNOSIS — Z510 Encounter for antineoplastic radiation therapy: Secondary | ICD-10-CM | POA: Diagnosis not present

## 2016-02-22 DIAGNOSIS — C3491 Malignant neoplasm of unspecified part of right bronchus or lung: Secondary | ICD-10-CM

## 2016-02-22 DIAGNOSIS — I219 Acute myocardial infarction, unspecified: Secondary | ICD-10-CM | POA: Diagnosis not present

## 2016-02-22 DIAGNOSIS — K219 Gastro-esophageal reflux disease without esophagitis: Secondary | ICD-10-CM | POA: Diagnosis not present

## 2016-02-22 DIAGNOSIS — F418 Other specified anxiety disorders: Secondary | ICD-10-CM | POA: Diagnosis not present

## 2016-02-22 DIAGNOSIS — E785 Hyperlipidemia, unspecified: Secondary | ICD-10-CM | POA: Diagnosis not present

## 2016-02-22 DIAGNOSIS — I2510 Atherosclerotic heart disease of native coronary artery without angina pectoris: Secondary | ICD-10-CM | POA: Diagnosis not present

## 2016-02-22 DIAGNOSIS — I119 Hypertensive heart disease without heart failure: Secondary | ICD-10-CM | POA: Diagnosis not present

## 2016-02-22 NOTE — Progress Notes (Signed)
  Radiation Oncology         [id number]   Name: Evan [name] MRN: [id number]   Date: 02/22/2016  DOB: 1927-07-18   Weekly Radiation Therapy Management    ICD-9-CM ICD-10-CM   1. Adenocarcinoma of right lung (HCC) 162.9 C34.91     Current Dose: 40 Gy  Planned Dose:  50 Gy  Narrative The patient presents for routine under treatment assessment. Patient denies pain. He reports occasional shortness of breath. His son reports the shortness of breath usually occurs after first waking up in the morning. Patient denies a cough. He reports a good energy level. The patient is without complaint. Set-up films were reviewed. The chart was checked.  Physical Findings  height is '5\' 6"'$  (1.676 m) and weight is 174 lb 6.4 oz (79.1 kg). His oral temperature is 97.7 F (36.5 C). His blood pressure is 152/74 (abnormal) and his pulse is 56 (abnormal). His oxygen saturation is 99%. . Weight essentially stable.  No significant changes.  Impression The patient is tolerating radiation.  Plan Continue treatment as planned.         Sheral Apley Tammi Klippel, M.D. This document serves as a record of services personally performed by Tyler Pita, MD. It was created on his behalf by Bethann Humble, a trained medical scribe. The creation of this record is based on the scribe's personal observations and the provider's statements to them. This document has been checked and approved by the attending provider.

## 2016-02-22 NOTE — Progress Notes (Addendum)
Evan [name] has completed 4 fractions to his right lower lung.  He denies having pain.  He reports having occasionally shortness of breath.  His son reports it will happen to him when he first wakes up in the morning.  He denies having a cough.  He reports having a good energy level.  BP (!) 152/74 (BP Location: Right Arm, Patient Position: Sitting)   Pulse (!) 56   Temp 97.7 F (36.5 C) (Oral)   Ht '5\' 6"'$  (1.676 m)   Wt 174 lb 6.4 oz (79.1 kg)   SpO2 99%   BMI 28.15 kg/m    Wt Readings from Last 3 Encounters:  02/22/16 174 lb 6.4 oz (79.1 kg)  01/25/16 174 lb 12.8 oz (79.3 kg)  01/18/16 175 lb 3.2 oz (79.5 kg)

## 2016-02-23 ENCOUNTER — Ambulatory Visit: Payer: Commercial Managed Care - HMO | Admitting: Radiation Oncology

## 2016-02-24 ENCOUNTER — Encounter: Payer: Self-pay | Admitting: Radiation Oncology

## 2016-02-24 ENCOUNTER — Ambulatory Visit
Admission: RE | Admit: 2016-02-24 | Discharge: 2016-02-24 | Disposition: A | Discharge status: Home / Self Care | Payer: Commercial Managed Care - HMO | Source: Ambulatory Visit | Attending: Radiation Oncology | Admitting: Radiation Oncology

## 2016-02-24 DIAGNOSIS — C3491 Malignant neoplasm of unspecified part of right bronchus or lung: Secondary | ICD-10-CM | POA: Diagnosis not present

## 2016-02-24 DIAGNOSIS — I517 Cardiomegaly: Secondary | ICD-10-CM | POA: Diagnosis not present

## 2016-02-24 DIAGNOSIS — C3431 Malignant neoplasm of lower lobe, right bronchus or lung: Secondary | ICD-10-CM | POA: Diagnosis not present

## 2016-02-24 DIAGNOSIS — I219 Acute myocardial infarction, unspecified: Secondary | ICD-10-CM | POA: Diagnosis not present

## 2016-02-24 DIAGNOSIS — E785 Hyperlipidemia, unspecified: Secondary | ICD-10-CM | POA: Diagnosis not present

## 2016-02-24 DIAGNOSIS — F418 Other specified anxiety disorders: Secondary | ICD-10-CM | POA: Diagnosis not present

## 2016-02-24 DIAGNOSIS — K219 Gastro-esophageal reflux disease without esophagitis: Secondary | ICD-10-CM | POA: Diagnosis not present

## 2016-02-24 DIAGNOSIS — I119 Hypertensive heart disease without heart failure: Secondary | ICD-10-CM | POA: Diagnosis not present

## 2016-02-24 DIAGNOSIS — I2510 Atherosclerotic heart disease of native coronary artery without angina pectoris: Secondary | ICD-10-CM | POA: Diagnosis not present

## 2016-02-24 DIAGNOSIS — Z510 Encounter for antineoplastic radiation therapy: Secondary | ICD-10-CM | POA: Diagnosis not present

## 2016-02-25 ENCOUNTER — Ambulatory Visit: Payer: Commercial Managed Care - HMO | Admitting: Radiation Oncology

## 2016-02-29 ENCOUNTER — Telehealth: Payer: Self-pay | Admitting: Radiation Oncology

## 2016-02-29 NOTE — Telephone Encounter (Signed)
Returned my call. He reports his father developed hoarseness shortly after he completed radiation therapy. He reports that his father denies pain associated with swallowing, increased SOB or cough. Explained the radiation is continuing to work in his father but, the hoarseness should start to slowly resolve in the next two weeks. He states, "I thought you said something like that might happen but, I wanted to make sure." Encouraged to call with any future needs or questions. He verbalized understanding and expressed appreciation for the call.

## 2016-02-29 NOTE — Telephone Encounter (Signed)
Received message from patient's son, Alvester Chou, requesting return call. Phoned back. No answer. Left voicemail message with contact information.

## 2016-03-03 NOTE — Progress Notes (Signed)
°  Radiation Oncology         [phone] ________________________________  Name: Evan [name] MRN: [id number]  Date: 02/24/2016  DOB: 1927-11-03  End of Treatment Note  Diagnosis:   82 yo man with clinical stage I Adenocarcinoma of the right lower lung     Indication for treatment:  Curative, Definitive SBRT       Radiation treatment dates:   02/15/2016 to 02/22/2016  Site/dose:   The target was treated to 50 Gy in 5 fractions of 10 Gy  Beams/energy:   The patient was treated using stereotactic body radiotherapy according to a 3D conformal radiotherapy plan.  Volumetric arc fields were employed to deliver 6 MV X-rays.  Image guidance was performed with per fraction cone beam CT prior to treatment under personal MD supervision.  Immobilization was achieved using BodyFix Pillow.  Narrative: The patient tolerated radiation treatment relatively well. He reported occasional shortness of breath which usually occurred after first waking up in the morning.   Plan: The patient has completed radiation treatment. The patient will return to radiation oncology clinic for routine followup in one month. I advised them to call or return sooner if they have any questions or concerns related to their recovery or treatment. ________________________________  Sheral Apley. Tammi Klippel, M.D.  This document serves as a record of services personally performed by Tyler Pita, MD. It was created on his behalf by Arlyce Harman, a trained medical scribe. The creation of this record is based on the scribe's personal observations and the provider's statements to them. This document has been checked and approved by the attending provider.

## 2016-03-29 ENCOUNTER — Encounter: Payer: Self-pay | Admitting: Radiation Oncology

## 2016-03-29 ENCOUNTER — Ambulatory Visit
Admission: RE | Admit: 2016-03-29 | Discharge: 2016-03-29 | Disposition: A | Discharge status: Home / Self Care | Payer: Commercial Managed Care - HMO | Source: Ambulatory Visit | Attending: Radiation Oncology | Admitting: Radiation Oncology

## 2016-03-29 VITALS — BP 130/67 | HR 61 | Temp 98.7°F | Resp 16 | Wt 175.6 lb

## 2016-03-29 DIAGNOSIS — Z923 Personal history of irradiation: Secondary | ICD-10-CM | POA: Diagnosis not present

## 2016-03-29 DIAGNOSIS — C3431 Malignant neoplasm of lower lobe, right bronchus or lung: Secondary | ICD-10-CM | POA: Diagnosis not present

## 2016-03-29 DIAGNOSIS — Z881 Allergy status to other antibiotic agents status: Secondary | ICD-10-CM | POA: Diagnosis not present

## 2016-03-29 DIAGNOSIS — Z79899 Other long term (current) drug therapy: Secondary | ICD-10-CM | POA: Diagnosis not present

## 2016-03-29 DIAGNOSIS — C3491 Malignant neoplasm of unspecified part of right bronchus or lung: Secondary | ICD-10-CM

## 2016-03-29 DIAGNOSIS — Z888 Allergy status to other drugs, medicaments and biological substances status: Secondary | ICD-10-CM | POA: Diagnosis not present

## 2016-03-29 DIAGNOSIS — Z91040 Latex allergy status: Secondary | ICD-10-CM | POA: Diagnosis not present

## 2016-03-29 DIAGNOSIS — Z7902 Long term (current) use of antithrombotics/antiplatelets: Secondary | ICD-10-CM | POA: Diagnosis not present

## 2016-03-29 NOTE — Progress Notes (Signed)
Radiation Oncology         [phone] ________________________________  Name: Evan [name] MRN: [id number]  Date: 03/29/2016  DOB: Dec 16, 1927  Post Treatment Note  CC: Odette Fraction, MD  [name], *  Diagnosis:  82 yo man with clinical stage IA, NSCLC, Adenocarcinoma of the right lower lung.  Interval Since Last Radiation:  5 weeks   02/15/2016 to 02/22/2016 SBRT Treatment: 1. The target was treated to 50 Gy in 5 fractions of 10 Gy  Narrative:  The patient returns today for routine follow-up. Overall, the patient tolerated radiation therapy well. He did not have any concerns with skin, or treatment related side effects. He has follow-up scheduled to see Dr. Julien Nordmann in February 2018.                        On review of systems, the patient states he is doing great. He has had some productive mucus there is clear to yellow in color. He denies any progressive shortness of breath, he states that his throat has been sore for the last 2 weeks and he denies difficulty with swallowing at this point. He denies any hemoptysis. He is not experiencing fevers or chills. No other complaints or verbalized.  ALLERGIES:  is allergic to feldene [piroxicam]; statins; doxycycline; latex; tequin; and valium.  Meds: Current Outpatient Prescriptions  Medication Sig Dispense Refill  . albuterol (VENTOLIN HFA) 108 (90 Base) MCG/ACT inhaler Inhale 1-2 puffs into the lungs every 6 (six) hours as needed for wheezing or shortness of breath. 18 g 2  . atenolol (TENORMIN) 50 MG tablet TAKE 1 TABLET BY MOUTH  EVERY DAY 30 tablet 1  . Cholecalciferol 2000 units TABS Take 1 tablet (2,000 Units total) by mouth daily. 30 each 11  . clopidogrel (PLAVIX) 75 MG tablet Take 1 tablet (75 mg total) by mouth daily. 90 tablet 2  . diclofenac sodium (VOLTAREN) 1 % GEL Apply 2 g topically daily as needed (for pain in knees and feet).    . Fluocinonide Emulsified Base 0.05 % CREA APPLY 1 APPLICATION TOPICALLY  TWICE DAILY 30 g 0  . FLUoxetine (PROZAC) 20 MG capsule TAKE ONE CAPSULE BY MOUTH DAILY 90 capsule 3  . HYDROcodone-acetaminophen (NORCO/VICODIN) 5-325 MG tablet Take 1 tablet by mouth as needed for pain.    Marland Kitchen leflunomide (ARAVA) 10 MG tablet Take 10 mg by mouth daily.     Marland Kitchen Lifitegrast (XIIDRA) 5 % SOLN Apply 1 drop to eye 2 (two) times daily. 1 each 3  . LORazepam (ATIVAN) 1 MG tablet TAKE ONE (1) TABLET BY MOUTH 3 TIMES DAILY. TAKE AT 8AM, 12 NOON, AND4PM. 90 tablet 2  . nitroGLYCERIN (NITROSTAT) 0.4 MG SL tablet Place 1 tablet (0.4 mg total) under the tongue every 5 (five) minutes as needed for chest pain. 25 tablet 0  . pantoprazole (PROTONIX) 40 MG tablet Take 1 tablet (40 mg total) by mouth daily. 90 tablet 2  . rosuvastatin (CRESTOR) 5 MG tablet Take 1 tablet (5 mg total) by mouth daily. 90 tablet 2  . Spacer/Aero-Holding Chambers (AEROCHAMBER PLUS WITH MASK) inhaler Use as instructed 1 each 2  . tamsulosin (FLOMAX) 0.4 MG CAPS capsule Take 0.4 mg by mouth daily after supper.    . traMADol (ULTRAM) 50 MG tablet Take 1 tablet by mouth as needed for pain.    . traZODone (DESYREL) 50 MG tablet Take 50 mg by mouth at bedtime as needed for sleep.    Marland Kitchen  Turmeric 500 MG TABS Take 1 tablet by mouth at bedtime.     No current facility-administered medications for this encounter.     Physical Findings:  weight is 175 lb 9.6 oz (79.7 kg). His oral temperature is 98.7 F (37.1 C). His blood pressure is 130/67 and his pulse is 61. His respiration is 16 and oxygen saturation is 98%.  In general this is a well appearing Caucasian male in no acute distress.He's alert and oriented x4 and appropriate throughout the examination. Cardiopulmonary assessment is negative for acute distress and she exhibits normal effort.   Lab Findings: Lab Results  Component Value Date   WBC 4.1 12/30/2015   HGB 13.2 12/30/2015   HCT 40.5 12/30/2015   MCV 90.8 12/30/2015   PLT 100 (L) 12/30/2015     Radiographic  Findings: No results found.  Impression/Plan: 69. 82 yo man with clinical stage IA, NSCLC, Adenocarcinoma of the right lower lung.  The patient is doing well since completing radiotherapy. We discussed the rationale for repeat imaging of the chest to compare his new baseline since completing radiation. He will move forward with this in the next week or so, and then moving forward will continue to follow up with Dr. Julien Nordmann area and he has plans to see him in February 2018. We will review the results of his CT scan by phone, and defer additional follow-up thereafter to Dr. Julien Nordmann. We would be happy to see him moving forward their questions or concerns for possible need for additional radiotherapy in the future. He is also given precautions for went to call and we discussed the rare but real risk of pneumonitis which he does not have the triad of symptoms to be concerned by.      Carola Rhine, PAC

## 2016-03-29 NOTE — Progress Notes (Addendum)
Weight and vitals stable. Reports generalized pain related to effects of arthritis. Reports a productive cough with yellow sputum. Reports a sore throat. Patient reports the cough and sore throat presented approximately 2 weeks ago. Denies difficulty swallowing. Denies hemoptysis. Reports skin within treatment field is of normal color and appearance. Reports fatigue. Scheduled to follow up with Dr. Julien Nordmann on 05/19/16.  BP 130/67 (BP Location: Left Arm, Patient Position: Sitting, Cuff Size: Normal)   Pulse 61   Temp 98.7 F (37.1 C) (Oral)   Resp 16   Wt 175 lb 9.6 oz (79.7 kg)   SpO2 98%   BMI 28.34 kg/m  Wt Readings from Last 3 Encounters:  03/29/16 175 lb 9.6 oz (79.7 kg)  02/22/16 174 lb 6.4 oz (79.1 kg)  01/25/16 174 lb 12.8 oz (79.3 kg)

## 2016-04-05 ENCOUNTER — Ambulatory Visit
Admission: RE | Admit: 2016-04-05 | Discharge: 2016-04-05 | Disposition: A | Discharge status: Home / Self Care | Payer: Commercial Managed Care - HMO | Source: Ambulatory Visit | Attending: Radiation Oncology | Admitting: Radiation Oncology

## 2016-04-05 ENCOUNTER — Ambulatory Visit (HOSPITAL_COMMUNITY)
Admission: RE | Admit: 2016-04-05 | Discharge: 2016-04-05 | Disposition: A | Discharge status: Home / Self Care | Payer: Commercial Managed Care - HMO | Source: Ambulatory Visit | Attending: Radiation Oncology | Admitting: Radiation Oncology

## 2016-04-05 DIAGNOSIS — Z923 Personal history of irradiation: Secondary | ICD-10-CM | POA: Insufficient documentation

## 2016-04-05 DIAGNOSIS — I700 Atherosclerosis of aorta: Secondary | ICD-10-CM | POA: Insufficient documentation

## 2016-04-05 DIAGNOSIS — Z951 Presence of aortocoronary bypass graft: Secondary | ICD-10-CM | POA: Diagnosis not present

## 2016-04-05 DIAGNOSIS — C3491 Malignant neoplasm of unspecified part of right bronchus or lung: Secondary | ICD-10-CM

## 2016-04-05 DIAGNOSIS — R590 Localized enlarged lymph nodes: Secondary | ICD-10-CM | POA: Insufficient documentation

## 2016-04-05 DIAGNOSIS — R911 Solitary pulmonary nodule: Secondary | ICD-10-CM | POA: Diagnosis not present

## 2016-04-05 DIAGNOSIS — I2510 Atherosclerotic heart disease of native coronary artery without angina pectoris: Secondary | ICD-10-CM | POA: Insufficient documentation

## 2016-04-05 LAB — BASIC METABOLIC PANEL
Anion Gap: 10 mEq/L (ref 3–11)
BUN: 15 mg/dL (ref 7.0–26.0)
CO2: 21 mEq/L — ABNORMAL LOW (ref 22–29)
Calcium: 9.2 mg/dL (ref 8.4–10.4)
Chloride: 109 mEq/L (ref 98–109)
Creatinine: 1.1 mg/dL (ref 0.7–1.3)
EGFR: 61 mL/min/{1.73_m2} — ABNORMAL LOW (ref >90)
Glucose: 91 mg/dl (ref 70–140)
Potassium: 3.8 mEq/L (ref 3.5–5.1)
Sodium: 140 mEq/L (ref 136–145)

## 2016-04-05 MED ORDER — IOPAMIDOL (ISOVUE-300) INJECTION 61%
75.0000 mL | Freq: Once | INTRAVENOUS | Status: AC | PRN
  Administered 2016-04-05: 75 mL via INTRAVENOUS

## 2016-04-05 MED ORDER — IOPAMIDOL (ISOVUE-300) INJECTION 61%
INTRAVENOUS | Status: AC
  Filled 2016-04-05: qty 75

## 2016-04-07 ENCOUNTER — Telehealth: Payer: Self-pay | Admitting: Radiation Oncology

## 2016-04-07 DIAGNOSIS — C3491 Malignant neoplasm of unspecified part of right bronchus or lung: Secondary | ICD-10-CM

## 2016-04-07 NOTE — Telephone Encounter (Signed)
I spoke with the patient's son Evan [name] regarding his father's CT scan. Given the mild pneumonitis changes seen, Dr. Tammi Klippel would recommend a repeat CT in 3 months time. He has a scheduled CT and OV with Dr. Julien Nordmann next month. I will contact his navigator, [name] to let her know about the recommendations. We would be happy to see the patient back in the future as needed.

## 2016-04-18 ENCOUNTER — Ambulatory Visit: Payer: Commercial Managed Care - HMO | Admitting: Family Medicine

## 2016-04-19 ENCOUNTER — Ambulatory Visit: Payer: Commercial Managed Care - HMO | Admitting: Pulmonary Disease

## 2016-05-02 ENCOUNTER — Other Ambulatory Visit: Payer: Self-pay | Admitting: Family Medicine

## 2016-05-02 NOTE — Telephone Encounter (Signed)
Ok to refill 

## 2016-05-03 NOTE — Telephone Encounter (Signed)
ok 

## 2016-05-03 NOTE — Telephone Encounter (Signed)
Medication called to pharmacy. 

## 2016-05-13 ENCOUNTER — Other Ambulatory Visit: Payer: Commercial Managed Care - HMO

## 2016-05-16 ENCOUNTER — Encounter: Payer: Self-pay | Admitting: Family Medicine

## 2016-05-16 ENCOUNTER — Telehealth: Payer: Self-pay

## 2016-05-16 ENCOUNTER — Other Ambulatory Visit: Payer: Self-pay | Admitting: Medical Oncology

## 2016-05-16 ENCOUNTER — Ambulatory Visit (INDEPENDENT_AMBULATORY_CARE_PROVIDER_SITE_OTHER): Payer: Medicare HMO | Admitting: Family Medicine

## 2016-05-16 VITALS — BP 136/68 | HR 78 | Temp 98.0°F | Resp 20 | Ht 68.0 in | Wt 172.0 lb

## 2016-05-16 DIAGNOSIS — C3431 Malignant neoplasm of lower lobe, right bronchus or lung: Secondary | ICD-10-CM | POA: Diagnosis not present

## 2016-05-16 DIAGNOSIS — R05 Cough: Secondary | ICD-10-CM | POA: Diagnosis not present

## 2016-05-16 DIAGNOSIS — J700 Acute pulmonary manifestations due to radiation: Secondary | ICD-10-CM | POA: Diagnosis not present

## 2016-05-16 DIAGNOSIS — R059 Cough, unspecified: Secondary | ICD-10-CM

## 2016-05-16 MED ORDER — PREDNISONE 20 MG PO TABS: 40.0000 mg | ORAL_TABLET | Freq: Every day | ORAL | 0 refills | Status: DC

## 2016-05-16 MED ORDER — HYDROCODONE-HOMATROPINE 5-1.5 MG/5ML PO SYRP: 5.0000 mL | ORAL_SOLUTION | Freq: Four times a day (QID) | ORAL | 0 refills | Status: DC | PRN

## 2016-05-16 NOTE — Progress Notes (Addendum)
Subjective:    Patient ID: Evan [name], male    DOB: 15-Jul-1927, 83 y.o.   MRN: [id number]  HPI 83 yo man with clinical stage IA, NSCLC, Adenocarcinoma of the right lower lung.Has completed radiotherapy. Scheduled to see Dr. Julien [name] in Feb.  CT scan in 04/2016 revealed: Today's study demonstrates evolving postradiation changes throughout the right mid to lower lung. Previously demonstrated right lower lobe pulmonary nodule has increased in size and currently is a 3.6 x 2.9 x 1.8 cm mass which now makes broad contact with the overlying pleura. No associated pleural effusion at this time. Attention on followup studies is recommended.  Patient states these had a dry nonproductive cough gradually worsening over the last 4 weeks. It is a constant irritating cough. He denies any hemoptysis. He denies any chest pain. He denies any purulent sputum. He denies any fever. He denies any pleurisy. He denies any shortness of breath  Past Medical History:  Diagnosis Date  . Adenocarcinoma of right lung ([place]) 01/06/2016  . Anginal chest pain at rest [facility])    Chronic non, controlled on Lorazepam  . Anxiety   . Arthritis    "all over"   . BPH (benign prostatic hyperplasia)   . CAD (coronary artery disease)   . Chronic lower back pain   . Complication of anesthesia    "problems making water afterwards"  . Depression   . DJD (degenerative joint disease)   . Esophageal ulcer without bleeding    bid ppi indefinitely  . Family history of adverse reaction to anesthesia    "children w/PONV"  . GERD (gastroesophageal reflux disease)   . Headache    "couple/week maybe" (09/04/2015)  . Hemochromatosis    Possible, elevated iron stores, hook-like osteophytes on hand films, normal LFTs  . Hepatitis    "yellow jaundice as a baby"  . History of ASCVD    MULTIVESSEL  . Hyperlipidemia   . Hypertension   . Myocardial infarction 1999  . Osteoarthritis   . Pneumonia    "several times; got a little now"  (09/04/2015)  . Rheumatoid arthritis [facility])    Past Surgical History:  Procedure Laterality Date  . ARM SKIN LESION BIOPSY / EXCISION Left 09/02/2015  . CATARACT EXTRACTION W/ INTRAOCULAR LENS  IMPLANT, BILATERAL Bilateral   . CHOLECYSTECTOMY OPEN    . CORNEAL TRANSPLANT Bilateral    "one at [facility]; one at [facility]"  . CORONARY ANGIOPLASTY WITH STENT PLACEMENT    . CORONARY ARTERY BYPASS GRAFT  1999  . DESCENDING AORTIC ANEURYSM REPAIR W/ STENT     DE stent ostium into the right radial free graft at OM-, 02-2007  . ESOPHAGOGASTRODUODENOSCOPY N/A 11/09/2014   Procedure: ESOPHAGOGASTRODUODENOSCOPY (EGD);  Surgeon: Evan Irani, MD;  Location: [facility] ENDOSCOPY;  Service: Endoscopy;  Laterality: N/A;  . INGUINAL HERNIA REPAIR    . JOINT REPLACEMENT    . NASAL SINUS SURGERY    . SHOULDER OPEN ROTATOR CUFF REPAIR Right   . TOTAL KNEE ARTHROPLASTY Bilateral    Current Outpatient Prescriptions on File Prior to Visit  Medication Sig Dispense Refill  . albuterol (VENTOLIN HFA) 108 (90 Base) MCG/ACT inhaler Inhale 1-2 puffs into the lungs every 6 (six) hours as needed for wheezing or shortness of breath. 18 g 2  . atenolol (TENORMIN) 50 MG tablet TAKE 1 TABLET BY MOUTH  EVERY DAY 30 tablet 1  . Cholecalciferol 2000 units TABS Take 1 tablet (2,000 Units total) by mouth daily. 30 each 11  .  clopidogrel (PLAVIX) 75 MG tablet Take 1 tablet (75 mg total) by mouth daily. 90 tablet 2  . diclofenac sodium (VOLTAREN) 1 % GEL Apply 2 g topically daily as needed (for pain in knees and feet).    . Fluocinonide Emulsified Base 0.05 % CREA APPLY 1 APPLICATION TOPICALLY TWICE DAILY 30 g 0  . FLUoxetine (PROZAC) 20 MG capsule TAKE ONE CAPSULE BY MOUTH DAILY 90 capsule 3  . HYDROcodone-acetaminophen (NORCO/VICODIN) 5-325 MG tablet Take 1 tablet by mouth as needed for pain.    Marland Kitchen leflunomide (ARAVA) 10 MG tablet Take 10 mg by mouth daily.     Marland Kitchen Lifitegrast (XIIDRA) 5 % SOLN Apply 1 drop to eye 2 (two) times daily. 1 each 3  .  LORazepam (ATIVAN) 1 MG tablet TAKE ONE (1) TABLET BY MOUTH 3 TIMES DAILY (TAKE AT EIGHT IN THE MORNING NOONAND FOUR PM) 90 tablet 2  . nitroGLYCERIN (NITROSTAT) 0.4 MG SL tablet Place 1 tablet (0.4 mg total) under the tongue every 5 (five) minutes as needed for chest pain. 25 tablet 0  . pantoprazole (PROTONIX) 40 MG tablet Take 1 tablet (40 mg total) by mouth daily. 90 tablet 2  . rosuvastatin (CRESTOR) 5 MG tablet Take 1 tablet (5 mg total) by mouth daily. 90 tablet 2  . Spacer/Aero-Holding Chambers (AEROCHAMBER PLUS WITH MASK) inhaler Use as instructed 1 each 2  . tamsulosin (FLOMAX) 0.4 MG CAPS capsule Take 0.4 mg by mouth daily after supper.    . traMADol (ULTRAM) 50 MG tablet Take 1 tablet by mouth as needed for pain.    . traZODone (DESYREL) 50 MG tablet Take 50 mg by mouth at bedtime as needed for sleep.     No current facility-administered medications on file prior to visit.    Allergies  Allergen Reactions  . Feldene [Piroxicam] Other (See Comments)    REACTION: Blisters  . Statins Other (See Comments)    Myalgias  . Doxycycline Other (See Comments)    REACTION:  unknown  . Latex Rash  . Tequin Anxiety  . Valium Other (See Comments)    REACTION:  unknown   Social History   Social History  . Marital status: Widowed    Spouse name: N/A  . Number of children: N/A  . Years of education: N/A   Occupational History  . Not on file.   Social History Main Topics  . Smoking status: Never Smoker  . Smokeless tobacco: Former Systems developer    Types: Chew     Comment: "quit chewing in the 1960s"  . Alcohol use No  . Drug use: No  . Sexual activity: No   Other Topics Concern  . Not on file   Social History Narrative  . No narrative on file    Review of Systems  All other systems reviewed and are negative.      Objective:   Physical Exam  HENT:  Right Ear: External ear normal.  Left Ear: External ear normal.  Nose: Nose normal.  Mouth/Throat: Oropharynx is clear and  moist. No oropharyngeal exudate.  Eyes: Conjunctivae are normal.  Neck: Neck supple.  Cardiovascular: Normal rate, regular rhythm and normal heart sounds.   Pulmonary/Chest: Effort normal and breath sounds normal. No respiratory distress. He has no wheezes. He has no rales. He exhibits no tenderness.  Lymphadenopathy:    He has no cervical adenopathy.  Vitals reviewed.         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 I will send the patient for chest x-ray today to rule out infection although I do not appreciate any clinical findings of pneumonia. Begin prednisone 40 mg a day and recheck in one week. At that time I'll begin to wean the patient down if his cough is starting to improve. Meanwhile I will treat the patient's cough symptomatically with Hycodan 1 teaspoon every 4-6 hours as needed for cough. Should his cough worsen or should he develop fevers or shortness of breath he needs to return immediately  Addendum: Son, Alvester Chou, called back threatening that there will be "hell to pay" if anyone sends his dad to [facility] again.  I explained to the son that his dad could go anywhere to get a CXR, I had sent the order to [facility] because they were going to get groceries in [place].  Son had no other concerns and phone call was ended.    Addendum 05/20/16 Patient saw his oncologist who also recommended a chest x-ray yesterday on the 15th. Patient never got a chest x-ray that I ordered on the 12th. They never got the chest x-ray yesterday. When I saw the patient had an appointment for this morning I called his son to ask him to get the chest x-ray. In short order, the son begin to curse and scream.  Eventually the son brought the patient to the office where I again asked him to go get the chest x-ray.  Son  was belligerent to me and my staff.  He stated that he wished he were my receptionist's husband so he could "beat that bitch's ass."  I asked him to leave.  Finally, x-ray was obtained [facility]. I reviewed the results at approximately 12:30. There appears to be a chronic right lower lobe infiltrate. Son called back stating that his father is too sick. He's having to hold him up. At that point I recommended that he go to the hospital if he is that sick. Again the son begin to curse and threaten me.  In an effort to care for the patient I am calling out Ceftin 500 mg by mouth twice a day as well as a Z-Pak to cover possible community-acquired pneumonia. I would continue the prednisone as there are also changes consistent with possible radiation pneumonitis. I explained to the patient's son that his belligerent behavior is preventing [place] from caring for his father.

## 2016-05-16 NOTE — Telephone Encounter (Signed)
Pt states his CT is tomorrow. He is thinking this is too soon from his prior one. Please advise him.

## 2016-05-17 ENCOUNTER — Ambulatory Visit (HOSPITAL_COMMUNITY): Payer: Medicare HMO

## 2016-05-17 ENCOUNTER — Telehealth: Payer: Self-pay

## 2016-05-17 ENCOUNTER — Telehealth: Payer: Self-pay | Admitting: Medical Oncology

## 2016-05-17 NOTE — Telephone Encounter (Signed)
Pt contacted.

## 2016-05-17 NOTE — Telephone Encounter (Signed)
Son called to confirm appt on 15th. Done.

## 2016-05-17 NOTE — Telephone Encounter (Signed)
-----   Message from Curt Bears, MD sent at 05/16/2016  5:04 PM EST ----- Regarding: RE: appt?  Yes. ----- Message ----- From: Ardeen Garland, RN Sent: 05/16/2016  11:00 AM To: Curt Bears, MD Subject: appt?                                          Do you want to see him Thursday as scheduled?  I cancelled CT that is scheduled for tomorrow.

## 2016-05-19 ENCOUNTER — Ambulatory Visit (HOSPITAL_COMMUNITY)
Admission: RE | Admit: 2016-05-19 | Discharge: 2016-05-19 | Disposition: A | Discharge status: Home / Self Care | Payer: Medicare HMO | Source: Ambulatory Visit | Attending: Internal Medicine | Admitting: Internal Medicine

## 2016-05-19 ENCOUNTER — Encounter: Payer: Self-pay | Admitting: Internal Medicine

## 2016-05-19 ENCOUNTER — Encounter (HOSPITAL_COMMUNITY): Payer: Self-pay

## 2016-05-19 ENCOUNTER — Ambulatory Visit (HOSPITAL_BASED_OUTPATIENT_CLINIC_OR_DEPARTMENT_OTHER): Payer: Medicare HMO | Admitting: Internal Medicine

## 2016-05-19 ENCOUNTER — Telehealth: Payer: Self-pay | Admitting: Internal Medicine

## 2016-05-19 VITALS — BP 108/59 | HR 55 | Temp 98.1°F | Resp 17 | Ht 68.0 in | Wt 173.9 lb

## 2016-05-19 DIAGNOSIS — R05 Cough: Secondary | ICD-10-CM | POA: Diagnosis not present

## 2016-05-19 DIAGNOSIS — C3491 Malignant neoplasm of unspecified part of right bronchus or lung: Secondary | ICD-10-CM

## 2016-05-19 DIAGNOSIS — C3431 Malignant neoplasm of lower lobe, right bronchus or lung: Secondary | ICD-10-CM

## 2016-05-19 NOTE — Progress Notes (Signed)
[place] Telephone:([phone]) [phone]   [phone]) [phone]  OFFICE PROGRESS NOTE  Odette Fraction, MD [address] [place] [address]  DIAGNOSIS: Stage IA (T1b, N0, M0) non-small cell lung cancer, adenocarcinoma presented with right lower lobe lung nodule but the patient also has some nonspecific bilateral hilar lymphadenopathy.These are most likely reactive but malignancy cannot be excluded at this point.  PRIOR THERAPY: Status post stereotactic radiotherapy under the care of Dr. Tammi Klippel completed on 02/22/2016.  CURRENT THERAPY: Observation  INTERVAL HISTORY: Evan [name] 83 y.o. male returns to the clinic today for follow-up visit accompanied by his son. The patient is feeling fine today except for the cough productive of yellowish sputum. He was seen by his primary care physician and had an order for chest x-ray at [facility] that was not performed. He completed a course of stereotactic radiotherapy to the right lower lobe lung nodule. Repeat imaging studies in January 2018 showed increase in the size of the right lower lobe pulmonary nodule as well as right hilar lymph nodes. He is scheduled for another scan in a few months for reevaluation of these lesions. He is here today for evaluation and discussion of his condition and treatment options. He denied having any current fever or chills. He has no nausea, vomiting, diarrhea or constipation.  MEDICAL HISTORY: Past Medical History:  Diagnosis Date  . Adenocarcinoma of right lung ([place]) 01/06/2016  . Anginal chest pain at rest [facility])    Chronic non, controlled on Lorazepam  . Anxiety   . Arthritis    "all over"   . BPH (benign prostatic hyperplasia)   . CAD (coronary artery disease)   . Chronic lower back pain   . Complication of anesthesia    "problems making water afterwards"  . Depression   . DJD (degenerative joint disease)   . Esophageal ulcer without bleeding    bid ppi indefinitely    . Family history of adverse reaction to anesthesia    "children w/PONV"  . GERD (gastroesophageal reflux disease)   . Headache    "couple/week maybe" (09/04/2015)  . Hemochromatosis    Possible, elevated iron stores, hook-like osteophytes on hand films, normal LFTs  . Hepatitis    "yellow jaundice as a baby"  . History of ASCVD    MULTIVESSEL  . Hyperlipidemia   . Hypertension   . Myocardial infarction 1999  . Osteoarthritis   . Pneumonia    "several times; got a little now" (09/04/2015)  . Rheumatoid arthritis (H[facility])     ALLERGIES:  is allergic to feldene [piroxicam]; statins; doxycycline; latex; tequin; and valium.  MEDICATIONS:  Current Outpatient Prescriptions  Medication Sig Dispense Refill  . albuterol (VENTOLIN HFA) 108 (90 Base) MCG/ACT inhaler Inhale 1-2 puffs into the lungs every 6 (six) hours as needed for wheezing or shortness of breath. 18 g 2  . atenolol (TENORMIN) 50 MG tablet TAKE 1 TABLET BY MOUTH  EVERY DAY 30 tablet 1  . Cholecalciferol 2000 units TABS Take 1 tablet (2,000 Units total) by mouth daily. 30 each 11  . clopidogrel (PLAVIX) 75 MG tablet Take 1 tablet (75 mg total) by mouth daily. 90 tablet 2  . Cyanocobalamin (VITAMIN B 12 PO) Take by mouth.    . diclofenac sodium (VOLTAREN) 1 % GEL Apply 2 g topically daily as needed (for pain in knees and feet).    . Fluocinonide Emulsified Base 0.05 % CREA APPLY 1 APPLICATION  TOPICALLY TWICE DAILY 30 g 0  . FLUoxetine (PROZAC) 20 MG capsule TAKE ONE CAPSULE BY MOUTH DAILY 90 capsule 3  . HYDROcodone-acetaminophen (NORCO/VICODIN) 5-325 MG tablet Take 1 tablet by mouth as needed for pain.    Marland Kitchen HYDROcodone-homatropine (HYCODAN) 5-1.5 MG/5ML syrup Take 5 mLs by mouth every 6 (six) hours as needed for cough. 120 mL 0  . leflunomide (ARAVA) 10 MG tablet Take 10 mg by mouth daily.     Marland Kitchen Lifitegrast (XIIDRA) 5 % SOLN Apply 1 drop to eye 2 (two) times daily. 1 each 3  . LORazepam (ATIVAN) 1 MG tablet TAKE ONE (1) TABLET BY  MOUTH 3 TIMES DAILY (TAKE AT EIGHT IN THE MORNING NOONAND FOUR PM) 90 tablet 2  . pantoprazole (PROTONIX) 40 MG tablet Take 1 tablet (40 mg total) by mouth daily. 90 tablet 2  . predniSONE (DELTASONE) 20 MG tablet Take 2 tablets (40 mg total) by mouth daily with breakfast. 14 tablet 0  . rosuvastatin (CRESTOR) 5 MG tablet Take 1 tablet (5 mg total) by mouth daily. 90 tablet 2  . Spacer/Aero-Holding Chambers (AEROCHAMBER PLUS WITH MASK) inhaler Use as instructed 1 each 2  . tamsulosin (FLOMAX) 0.4 MG CAPS capsule Take 0.4 mg by mouth daily after supper.    . traMADol (ULTRAM) 50 MG tablet Take 1 tablet by mouth as needed for pain.    . traZODone (DESYREL) 50 MG tablet Take 50 mg by mouth at bedtime as needed for sleep.    . nitroGLYCERIN (NITROSTAT) 0.4 MG SL tablet Place 1 tablet (0.4 mg total) under the tongue every 5 (five) minutes as needed for chest pain. (Patient not taking: Reported on 05/19/2016) 25 tablet 0   No current facility-administered medications for this visit.     SURGICAL HISTORY:  Past Surgical History:  Procedure Laterality Date  . ARM SKIN LESION BIOPSY / EXCISION Left 09/02/2015  . CATARACT EXTRACTION W/ INTRAOCULAR LENS  IMPLANT, BILATERAL Bilateral   . CHOLECYSTECTOMY OPEN    . CORNEAL TRANSPLANT Bilateral    "one at [facility]; one at [facility]"  . CORONARY ANGIOPLASTY WITH STENT PLACEMENT    . CORONARY ARTERY BYPASS GRAFT  1999  . DESCENDING AORTIC ANEURYSM REPAIR W/ STENT     DE stent ostium into the right radial free graft at OM-, 02-2007  . ESOPHAGOGASTRODUODENOSCOPY N/A 11/09/2014   Procedure: ESOPHAGOGASTRODUODENOSCOPY (EGD);  Surgeon: [name], MD;  Location: [facility] ENDOSCOPY;  Service: Endoscopy;  Laterality: N/A;  . INGUINAL HERNIA REPAIR    . JOINT REPLACEMENT    . NASAL SINUS SURGERY    . SHOULDER OPEN ROTATOR CUFF REPAIR Right   . TOTAL KNEE ARTHROPLASTY Bilateral     REVIEW OF SYSTEMS:  A comprehensive review of systems was negative except for: Constitutional:  positive for fatigue Respiratory: positive for cough, dyspnea on exertion and sputum   PHYSICAL EXAMINATION: General appearance: alert, cooperative, fatigued and no distress Head: Normocephalic, without obvious abnormality, atraumatic Neck: no adenopathy, no JVD, supple, symmetrical, trachea midline and thyroid not enlarged, symmetric, no tenderness/mass/nodules Lymph nodes: Cervical, supraclavicular, and axillary nodes normal. Resp: clear to auscultation bilaterally Back: symmetric, no curvature. ROM normal. No CVA tenderness. Cardio: regular rate and rhythm, S1, S2 normal, no murmur, click, rub or gallop GI: soft, non-tender; bowel sounds normal; no masses,  no organomegaly Extremities: extremities normal, atraumatic, no cyanosis or edema  ECOG PERFORMANCE STATUS: 1 - Symptomatic but completely ambulatory  Blood pressure (!) 108/59, pulse (!) 55, temperature 98.1 F (36.7  C), temperature source Oral, resp. rate 17, height '5\' 8"'$  (1.727 m), weight 173 lb 14.4 oz (78.9 kg), SpO2 98 %.  LABORATORY DATA: Lab Results  Component Value Date   WBC 4.1 12/30/2015   HGB 13.2 12/30/2015   HCT 40.5 12/30/2015   MCV 90.8 12/30/2015   PLT 100 (L) 12/30/2015      Chemistry      Component Value Date/Time   NA 140 04/05/2016 1559   K 3.8 04/05/2016 1559   CL 107 01/08/2016 1130   CO2 21 (L) 04/05/2016 1559   BUN 15.0 04/05/2016 1559   CREATININE 1.1 04/05/2016 1559      Component Value Date/Time   CALCIUM 9.2 04/05/2016 1559   ALKPHOS 73 11/20/2015 1637   AST 23 11/20/2015 1637   ALT 11 11/20/2015 1637   BILITOT 0.7 11/20/2015 1637       RADIOGRAPHIC STUDIES: No results found.  ASSESSMENT AND PLAN: This is a very pleasant 83 years old white male with questionable stage IB non-small cell lung cancer presented with right lower lobe lung mass status post stereotactic radiotherapy and he is currently on observation. His most recent CT scan of the chest showed increase in the size of  the right lower lobe pulmonary mass in addition to interval enlargement of the right upper lobe paratracheal lymph node. These findings are concerning for disease progression but could be sequela of the stereotactic radiotherapy with postradiation changes. He is scheduled for repeat CT scan of the chest on 07/03/2016. The patient is feeling fine today except for the cough productive of yellowish sputum. He was seen by his primary care physician and has an order for chest x-ray at [facility] but the patient doesn't like to go there for his x-ray. I will arrange for him to have chest x-[facility] done at [facility] today. We will call the patient with antibiotics if needed based on the chest x-ray results. I will see him back for follow-up visit in 3 months for evaluation and discussion of the next the scan results and recommendation regarding his condition. He was advised to call immediately if he has any concerning symptoms in the interval. The patient voices understanding of current disease status and treatment options and is in agreement with the current care plan.  All questions were answered. The patient knows to call the clinic with any problems, questions or concerns. We can certainly see the patient much sooner if necessary.  I spent 10 minutes counseling the patient face to face. The total time spent in the appointment was 15 minutes.  Disclaimer: This note was dictated with voice recognition software. Similar sounding words can inadvertently be transcribed and may not be corrected upon review.

## 2016-05-19 NOTE — Telephone Encounter (Signed)
Patient bypassed scheduling/check out area. Appointments scheduled per 05/19/16 los. Patient was mailed a copy of the appointment letter and schedule, per 05/19/16 los.

## 2016-05-20 ENCOUNTER — Ambulatory Visit (HOSPITAL_COMMUNITY)
Admission: RE | Admit: 2016-05-20 | Discharge: 2016-05-20 | Disposition: A | Discharge status: Home / Self Care | Payer: Medicare HMO | Source: Ambulatory Visit | Attending: Family Medicine | Admitting: Family Medicine

## 2016-05-20 ENCOUNTER — Ambulatory Visit: Payer: Commercial Managed Care - HMO | Admitting: Family Medicine

## 2016-05-20 ENCOUNTER — Other Ambulatory Visit: Payer: Self-pay

## 2016-05-20 ENCOUNTER — Telehealth: Payer: Self-pay | Admitting: *Deleted

## 2016-05-20 ENCOUNTER — Inpatient Hospital Stay (HOSPITAL_COMMUNITY)
Admission: EM | Admit: 2016-05-20 | Discharge: 2016-05-23 | DRG: 871 | Disposition: A | Discharge status: Home Health | Payer: Medicare HMO | Attending: Nephrology | Admitting: Nephrology

## 2016-05-20 ENCOUNTER — Encounter (HOSPITAL_COMMUNITY): Payer: Self-pay | Admitting: Emergency Medicine

## 2016-05-20 ENCOUNTER — Other Ambulatory Visit: Payer: Self-pay | Admitting: Family Medicine

## 2016-05-20 ENCOUNTER — Emergency Department (HOSPITAL_COMMUNITY): Payer: Medicare HMO

## 2016-05-20 DIAGNOSIS — R918 Other nonspecific abnormal finding of lung field: Secondary | ICD-10-CM | POA: Insufficient documentation

## 2016-05-20 DIAGNOSIS — D696 Thrombocytopenia, unspecified: Secondary | ICD-10-CM | POA: Diagnosis not present

## 2016-05-20 DIAGNOSIS — Z9841 Cataract extraction status, right eye: Secondary | ICD-10-CM | POA: Diagnosis not present

## 2016-05-20 DIAGNOSIS — Z7902 Long term (current) use of antithrombotics/antiplatelets: Secondary | ICD-10-CM

## 2016-05-20 DIAGNOSIS — F411 Generalized anxiety disorder: Secondary | ICD-10-CM | POA: Diagnosis present

## 2016-05-20 DIAGNOSIS — M069 Rheumatoid arthritis, unspecified: Secondary | ICD-10-CM | POA: Diagnosis present

## 2016-05-20 DIAGNOSIS — J700 Acute pulmonary manifestations due to radiation: Secondary | ICD-10-CM

## 2016-05-20 DIAGNOSIS — Z947 Corneal transplant status: Secondary | ICD-10-CM | POA: Diagnosis not present

## 2016-05-20 DIAGNOSIS — R509 Fever, unspecified: Secondary | ICD-10-CM | POA: Diagnosis not present

## 2016-05-20 DIAGNOSIS — Z9842 Cataract extraction status, left eye: Secondary | ICD-10-CM

## 2016-05-20 DIAGNOSIS — Z7952 Long term (current) use of systemic steroids: Secondary | ICD-10-CM

## 2016-05-20 DIAGNOSIS — I517 Cardiomegaly: Secondary | ICD-10-CM | POA: Insufficient documentation

## 2016-05-20 DIAGNOSIS — Z8249 Family history of ischemic heart disease and other diseases of the circulatory system: Secondary | ICD-10-CM

## 2016-05-20 DIAGNOSIS — Z85118 Personal history of other malignant neoplasm of bronchus and lung: Secondary | ICD-10-CM | POA: Diagnosis not present

## 2016-05-20 DIAGNOSIS — C3491 Malignant neoplasm of unspecified part of right bronchus or lung: Secondary | ICD-10-CM | POA: Diagnosis present

## 2016-05-20 DIAGNOSIS — I4891 Unspecified atrial fibrillation: Secondary | ICD-10-CM | POA: Diagnosis present

## 2016-05-20 DIAGNOSIS — D649 Anemia, unspecified: Secondary | ICD-10-CM | POA: Diagnosis present

## 2016-05-20 DIAGNOSIS — J101 Influenza due to other identified influenza virus with other respiratory manifestations: Secondary | ICD-10-CM | POA: Diagnosis present

## 2016-05-20 DIAGNOSIS — Y842 Radiological procedure and radiotherapy as the cause of abnormal reaction of the patient, or of later complication, without mention of misadventure at the time of the procedure: Secondary | ICD-10-CM | POA: Diagnosis present

## 2016-05-20 DIAGNOSIS — R0602 Shortness of breath: Secondary | ICD-10-CM | POA: Diagnosis not present

## 2016-05-20 DIAGNOSIS — G8929 Other chronic pain: Secondary | ICD-10-CM | POA: Diagnosis present

## 2016-05-20 DIAGNOSIS — A4189 Other specified sepsis: Secondary | ICD-10-CM | POA: Diagnosis not present

## 2016-05-20 DIAGNOSIS — I252 Old myocardial infarction: Secondary | ICD-10-CM | POA: Diagnosis not present

## 2016-05-20 DIAGNOSIS — G9341 Metabolic encephalopathy: Secondary | ICD-10-CM | POA: Diagnosis present

## 2016-05-20 DIAGNOSIS — R000 Tachycardia, unspecified: Secondary | ICD-10-CM | POA: Diagnosis present

## 2016-05-20 DIAGNOSIS — Z951 Presence of aortocoronary bypass graft: Secondary | ICD-10-CM

## 2016-05-20 DIAGNOSIS — E871 Hypo-osmolality and hyponatremia: Secondary | ICD-10-CM | POA: Diagnosis not present

## 2016-05-20 DIAGNOSIS — J1000 Influenza due to other identified influenza virus with unspecified type of pneumonia: Secondary | ICD-10-CM | POA: Diagnosis present

## 2016-05-20 DIAGNOSIS — Z955 Presence of coronary angioplasty implant and graft: Secondary | ICD-10-CM

## 2016-05-20 DIAGNOSIS — F329 Major depressive disorder, single episode, unspecified: Secondary | ICD-10-CM | POA: Diagnosis present

## 2016-05-20 DIAGNOSIS — K210 Gastro-esophageal reflux disease with esophagitis: Secondary | ICD-10-CM | POA: Diagnosis not present

## 2016-05-20 DIAGNOSIS — Z96653 Presence of artificial knee joint, bilateral: Secondary | ICD-10-CM | POA: Diagnosis present

## 2016-05-20 DIAGNOSIS — Z961 Presence of intraocular lens: Secondary | ICD-10-CM | POA: Diagnosis present

## 2016-05-20 DIAGNOSIS — J181 Lobar pneumonia, unspecified organism: Secondary | ICD-10-CM | POA: Diagnosis not present

## 2016-05-20 DIAGNOSIS — R0902 Hypoxemia: Secondary | ICD-10-CM

## 2016-05-20 DIAGNOSIS — I110 Hypertensive heart disease with heart failure: Secondary | ICD-10-CM | POA: Diagnosis present

## 2016-05-20 DIAGNOSIS — Z881 Allergy status to other antibiotic agents status: Secondary | ICD-10-CM

## 2016-05-20 DIAGNOSIS — M545 Low back pain: Secondary | ICD-10-CM | POA: Diagnosis present

## 2016-05-20 DIAGNOSIS — Z888 Allergy status to other drugs, medicaments and biological substances status: Secondary | ICD-10-CM

## 2016-05-20 DIAGNOSIS — J1100 Influenza due to unidentified influenza virus with unspecified type of pneumonia: Secondary | ICD-10-CM | POA: Diagnosis present

## 2016-05-20 DIAGNOSIS — I959 Hypotension, unspecified: Secondary | ICD-10-CM | POA: Diagnosis present

## 2016-05-20 DIAGNOSIS — M1990 Unspecified osteoarthritis, unspecified site: Secondary | ICD-10-CM | POA: Diagnosis present

## 2016-05-20 DIAGNOSIS — I2510 Atherosclerotic heart disease of native coronary artery without angina pectoris: Secondary | ICD-10-CM | POA: Diagnosis not present

## 2016-05-20 DIAGNOSIS — Z923 Personal history of irradiation: Secondary | ICD-10-CM | POA: Diagnosis not present

## 2016-05-20 DIAGNOSIS — R001 Bradycardia, unspecified: Secondary | ICD-10-CM | POA: Diagnosis not present

## 2016-05-20 DIAGNOSIS — I10 Essential (primary) hypertension: Secondary | ICD-10-CM | POA: Diagnosis present

## 2016-05-20 DIAGNOSIS — Z79899 Other long term (current) drug therapy: Secondary | ICD-10-CM

## 2016-05-20 DIAGNOSIS — K219 Gastro-esophageal reflux disease without esophagitis: Secondary | ICD-10-CM | POA: Diagnosis present

## 2016-05-20 DIAGNOSIS — R6510 Systemic inflammatory response syndrome (SIRS) of non-infectious origin without acute organ dysfunction: Secondary | ICD-10-CM

## 2016-05-20 DIAGNOSIS — Z9049 Acquired absence of other specified parts of digestive tract: Secondary | ICD-10-CM

## 2016-05-20 DIAGNOSIS — R05 Cough: Secondary | ICD-10-CM | POA: Diagnosis not present

## 2016-05-20 DIAGNOSIS — N179 Acute kidney failure, unspecified: Secondary | ICD-10-CM | POA: Diagnosis not present

## 2016-05-20 DIAGNOSIS — J189 Pneumonia, unspecified organism: Secondary | ICD-10-CM | POA: Diagnosis not present

## 2016-05-20 DIAGNOSIS — I4821 Permanent atrial fibrillation: Secondary | ICD-10-CM | POA: Diagnosis present

## 2016-05-20 DIAGNOSIS — Z91040 Latex allergy status: Secondary | ICD-10-CM

## 2016-05-20 DIAGNOSIS — I480 Paroxysmal atrial fibrillation: Secondary | ICD-10-CM | POA: Diagnosis not present

## 2016-05-20 DIAGNOSIS — Z8261 Family history of arthritis: Secondary | ICD-10-CM

## 2016-05-20 DIAGNOSIS — I5032 Chronic diastolic (congestive) heart failure: Secondary | ICD-10-CM | POA: Diagnosis not present

## 2016-05-20 DIAGNOSIS — E785 Hyperlipidemia, unspecified: Secondary | ICD-10-CM | POA: Diagnosis present

## 2016-05-20 MED ORDER — HYDROCODONE-HOMATROPINE 5-1.5 MG/5ML PO SYRP: 5.0000 mL | ORAL_SOLUTION | Freq: Four times a day (QID) | ORAL | 0 refills | Status: DC | PRN

## 2016-05-20 MED ORDER — SODIUM CHLORIDE 0.9 % IV BOLUS (SEPSIS)
1000.0000 mL | Freq: Once | INTRAVENOUS | Status: AC
  Administered 2016-05-21: 1000 mL via INTRAVENOUS

## 2016-05-20 MED ORDER — SODIUM CHLORIDE 0.9 % IV BOLUS (SEPSIS)
500.0000 mL | Freq: Once | INTRAVENOUS | Status: AC
  Administered 2016-05-21: 500 mL via INTRAVENOUS

## 2016-05-20 MED ORDER — VANCOMYCIN HCL IN DEXTROSE 1-5 GM/200ML-% IV SOLN
1000.0000 mg | Freq: Once | INTRAVENOUS | Status: AC
  Administered 2016-05-21: 1000 mg via INTRAVENOUS
  Filled 2016-05-20: qty 200

## 2016-05-20 MED ORDER — CEFUROXIME AXETIL 500 MG PO TABS: 500.0000 mg | ORAL_TABLET | Freq: Two times a day (BID) | ORAL | 0 refills | Status: DC

## 2016-05-20 MED ORDER — PIPERACILLIN-TAZOBACTAM 3.375 G IVPB 30 MIN
3.3750 g | Freq: Once | INTRAVENOUS | Status: AC
  Administered 2016-05-21: 3.375 g via INTRAVENOUS
  Filled 2016-05-20: qty 50

## 2016-05-20 MED ORDER — ACETAMINOPHEN 500 MG PO TABS
500.0000 mg | ORAL_TABLET | Freq: Once | ORAL | Status: AC
  Administered 2016-05-20: 500 mg via ORAL
  Filled 2016-05-20: qty 1

## 2016-05-20 MED ORDER — AZITHROMYCIN 250 MG PO TABS: ORAL_TABLET | ORAL | 0 refills | Status: DC

## 2016-05-20 NOTE — ED Provider Notes (Signed)
By signing my name below, I, Macon Large, attest that this documentation has been prepared under the direction and in the presence of Jerome, DO. Electronically Signed: Macon Large, ED Scribe. 05/20/16. 12:01 AM.   TIME SEEN: 11:48 PM  CHIEF COMPLAINT: Cough   HPI: HPI Comments: Evan [name] is a 83 y.o. male with PMHx of adenocarcinoma of the right lung status post radiation therapy, CAD, hypertension, hyperlipidemia, rheumatoid arthritis brought in by ambulance who presents to the Emergency Department complaining of gradually worsening, productive cough onset a week ago. Pt's relative reports associated fever. Pt was seen by his PCP earlier today and had a chest x-ray completed. Per nurse note, pt was administered a z-pack and cefuroxime. Pt was transferred to the ED by EMS and given '10mg'$  of albuterol, '1mg'$  of Atrovent followed by 125 solu-medrol. Per nurse note, pt had a reported Tmax fever of 105 at home. He was given tylenol 500 mg with minimal relief. Pt's rectal temperature in the ED today is 102.8. Per pt's relative, pt had radiation, for lung cancer, last administered on 03/18/2016 with no chemotherapy. He denies chest pain, V/D. Pt is not oxygen dependent at home. Hypoxic on RA here.  Pt also denies hx of DVT or PE.  PCP - [name]  ROS: See HPI Constitutional: cough, fever  Eyes: no drainage  ENT: no runny nose   Cardiovascular:  no chest pain  Resp:  SOB  GI: no vomiting GU: no dysuria Integumentary: no rash  Allergy: no hives  Musculoskeletal: no leg swelling  Neurological: no slurred speech ROS otherwise negative  PAST MEDICAL HISTORY/PAST SURGICAL HISTORY:  Past Medical History:  Diagnosis Date  . Adenocarcinoma of right lung ([place]) 01/06/2016  . Anginal chest pain at rest [facility])    Chronic non, controlled on Lorazepam  . Anxiety   . Arthritis    "all over"   . BPH (benign prostatic hyperplasia)   . CAD (coronary artery disease)   . Chronic lower  back pain   . Complication of anesthesia    "problems making water afterwards"  . Depression   . DJD (degenerative joint disease)   . Esophageal ulcer without bleeding    bid ppi indefinitely  . Family history of adverse reaction to anesthesia    "children w/PONV"  . GERD (gastroesophageal reflux disease)   . Headache    "couple/week maybe" (09/04/2015)  . Hemochromatosis    Possible, elevated iron stores, hook-like osteophytes on hand films, normal LFTs  . Hepatitis    "yellow jaundice as a baby"  . History of ASCVD    MULTIVESSEL  . Hyperlipidemia   . Hypertension   . Myocardial infarction 1999  . Osteoarthritis   . Pneumonia    "several times; got a little now" (09/04/2015)  . Rheumatoid arthritis ([place])     MEDICATIONS:  Prior to Admission medications   Medication Sig Start Date End Date Taking? Authorizing Provider  albuterol (VENTOLIN HFA) 108 (90 Base) MCG/ACT inhaler Inhale 1-2 puffs into the lungs every 6 (six) hours as needed for wheezing or shortness of breath. 02/18/16   [name], MD  atenolol (TENORMIN) 50 MG tablet TAKE 1 TABLET BY MOUTH  EVERY DAY 11/20/15   Alycia Rossetti, MD  azithromycin [facility]) 250 MG tablet 2 tabs poqday1, 1 tab poqday 2-5 05/20/16   [name], MD  cefUROXime (CEFTIN) 500 MG tablet Take 1 tablet (500 mg total) by mouth 2 (two) times daily with  a meal. 05/20/16   [name], MD  Cholecalciferol 2000 units TABS Take 1 tablet (2,000 Units total) by mouth daily. 02/04/16   [name], MD  clopidogrel (PLAVIX) 75 MG tablet Take 1 tablet (75 mg total) by mouth daily. 11/02/15   Jettie Booze, MD  Cyanocobalamin (VITAMIN B 12 PO) Take by mouth.    Historical Provider, MD  diclofenac sodium (VOLTAREN) 1 % GEL Apply 2 g topically daily as needed (for pain in knees and feet).    Historical Provider, MD  Fluocinonide Emulsified Base 0.05 % CREA APPLY 1 APPLICATION TOPICALLY TWICE DAILY 01/06/16   [name], MD   FLUoxetine (PROZAC) 20 MG capsule TAKE ONE CAPSULE BY MOUTH DAILY 06/15/15   [name], MD  HYDROcodone-acetaminophen (NORCO/VICODIN) 5-325 MG tablet Take 1 tablet by mouth as needed for pain. 07/31/15   Historical Provider, MD  HYDROcodone-homatropine (HYCODAN) 5-1.5 MG/5ML syrup Take 5 mLs by mouth every 6 (six) hours as needed for cough. 05/20/16   [name], MD  leflunomide (ARAVA) 10 MG tablet Take 10 mg by mouth daily.  08/01/15   Historical Provider, MD  Lifitegrast Shirley Friar) 5 % SOLN Apply 1 drop to eye 2 (two) times daily. 08/20/15   [name], MD  LORazepam (ATIVAN) 1 MG tablet TAKE ONE (1) TABLET BY MOUTH 3 TIMES DAILY (TAKE AT EIGHT IN THE MORNING NOONAND FOUR PM) 05/04/16   [name], MD  nitroGLYCERIN (NITROSTAT) 0.4 MG SL tablet Place 1 tablet (0.4 mg total) under the tongue every 5 (five) minutes as needed for chest pain. Patient not taking: Reported on 05/19/2016 07/14/15   Jettie Booze, MD  pantoprazole (PROTONIX) 40 MG tablet Take 1 tablet (40 mg total) by mouth daily. 11/02/15   Jettie Booze, MD  predniSONE (DELTASONE) 20 MG tablet Take 2 tablets (40 mg total) by mouth daily with breakfast. 05/16/16   [name], MD  rosuvastatin (CRESTOR) 5 MG tablet Take 1 tablet (5 mg total) by mouth daily. 11/02/15   Jettie Booze, MD  Spacer/Aero-Holding Chambers (AEROCHAMBER PLUS WITH MASK) inhaler Use as instructed 10/19/15   Charlesetta Shanks, MD  tamsulosin (FLOMAX) 0.4 MG CAPS capsule Take 0.4 mg by mouth daily after supper.    Historical Provider, MD  traMADol (ULTRAM) 50 MG tablet Take 1 tablet by mouth as needed for pain. 07/15/10   Historical Provider, MD  traZODone (DESYREL) 50 MG tablet Take 50 mg by mouth at bedtime as needed for sleep.    Historical Provider, MD    ALLERGIES:  Allergies  Allergen Reactions  . Feldene [Piroxicam] Other (See Comments)    REACTION: Blisters  . Statins Other (See Comments)    Myalgias  . Doxycycline  Other (See Comments)    REACTION:  unknown  . Latex Rash  . Tequin Anxiety  . Valium Other (See Comments)    REACTION:  unknown    SOCIAL HISTORY:  Social History  Substance Use Topics  . Smoking status: Never Smoker  . Smokeless tobacco: Former Systems developer    Types: Chew     Comment: "quit chewing in the 1960s"  . Alcohol use No    FAMILY HISTORY: Family History  Problem Relation Age of Onset  . Arthritis-Osteo Sister   . Heart attack Brother   . Heart attack Other   . Cancer Neg Hx     EXAM: BP 111/64 (BP Location: Left Arm)   Pulse (!) 126  Temp 102.8 F (39.3 C) (Rectal)   Resp 26   SpO2 95%  CONSTITUTIONAL: Alert and oriented and responds appropriately to questions. Elderly , chronically ill appearing, febrile, in mild respiratory distress HEAD: Normocephalic EYES: Conjunctivae clear, PERRL, EOMI ENT: normal nose; no rhinorrhea; moist mucous membranes NECK: Supple, no meningismus, no nuchal rigidity, no LAD  CARD: Regular and tachycardic; S1 and S2 appreciated; no murmurs, no clicks, no rubs, no gallops RESP: Tachypneic, speaking short sentences, diffused rhonchi breathe sounds without wheezes, mildy increased workup breathing, stats 87-93% on RA ABD/GI: Normal bowel sounds; non-distended; soft, non-tender, no rebound, no guarding, no peritoneal signs, no hepatosplenomegaly BACK:  The back appears normal and is non-tender to palpation, there is no CVA tenderness EXT: Normal ROM in all joints; non-tender to palpation; no edema; normal capillary refill; no cyanosis, no calf tenderness or swelling    SKIN: Normal color for age and race; warm; no rash NEURO: Sensation to light touch intact diffusely, cranial nerves II through XII intact, normal speech PSYCH: The patient's mood and manner are appropriate. Grooming and personal hygiene are appropriate.   MEDICAL DECISION MAKING: Patient here as a code sepsis. He is febrile, tachycardic and tachypneic.  Call primary care  physician today who ordered an outpatient chest x-ray which showed persistent nodular right lower lobe infiltrate that may be related to radiation. No other acute abnormality. Was not started on antibiotics. Will give broad-spectrum antibacterial, IV fluids given he meets SIRs criteria.  Family reports he is a full code. Will obtain labs, cultures, repeat chest x-ray, urine.  ED PROGRESS: Patient's labs reveal elevated lactate at 2.34. Troponin is negative. No significant leukocytosis. Urine shows no sign of infection. Patient's d-dimer is positive. Chest x-ray shows persistent hazy and nodular density in the right lung base most likely reflecting post radiation changes. There is a shallow lung inflation with increased basilar opacity that is likely atelectasis. No other infiltrate or edema noted. Given patient's elevated d-dimer, will obtain CT scan to evaluate further for pulmonary embolus which also show a better if patient does have pneumonia.  Vitals are improving.   3:00 AM  Pt doing well on 2 L nasal cannula. CT of the chest shows no pulmonary was. There is consolidation in the right lower lung care is visualization of the known right lower lung mass. Patchy infiltration in the lungs consistent with radiation change and new interstitial edema. BNP is elevated. He does not however appear volume overloaded but appears dry on exam. I suspect that this could be pneumonia instead especially over the right lower lung. Will admit.   3:10 AM  D/w Dr. Myna Hidalgo with hospitalist service who agrees on admission will see patient in place orders. Patient and family updated with plan.   I reviewed all nursing notes, vitals, pertinent old records, EKGs, labs, imaging (as available).   EKG Interpretation  Date/Time:  Friday May 20 2016 23:49:45 EST Ventricular Rate:  118 PR Interval:    QRS Duration: 96 QT Interval:  352 QTC Calculation: 494 R Axis:   -8 Text Interpretation:  Atrial fibrillation LVH  with secondary repolarization abnormality Borderline prolonged QT interval Confirmed by WARD,  DO, KRISTEN ([id number]) on 05/21/2016 12:13:08 AM         CRITICAL CARE Performed by: Nyra Jabs   Total critical care time: 45 minutes  Critical care time was exclusive of separately billable procedures and treating other patients.  Critical care was necessary to treat or prevent imminent or life-threatening  deterioration.  Critical care was time spent personally by me on the following activities: development of treatment plan with patient and/or surrogate as well as nursing, discussions with consultants, evaluation of patient's response to treatment, examination of patient, obtaining history from patient or surrogate, ordering and performing treatments and interventions, ordering and review of laboratory studies, ordering and review of radiographic studies, pulse oximetry and re-evaluation of patient's condition.   I personally performed the services described in this documentation, which was scribed in my presence. The recorded information has been reviewed and is accurate.     [place], DO 05/21/16 [phone]

## 2016-05-20 NOTE — ED Notes (Signed)
2100 pt gave tylenol '500mg'$ 

## 2016-05-20 NOTE — ED Triage Notes (Signed)
Per EMS, pt has had a productive cough he past week. Pt fever at home 105F and family gave tylenol at 2100. Temp here 102.8 rectally. Pt went to PCP for a chest xray today - was given z-pack and cefuroxine. Later throughout the day, pt had increased SOB. Family gave pt '5mg'$  Albuterol with no relief. EMS gave '10mg'$  of Albuterol, '1mg'$  of Atrovent, 125 solu-medrol. Pt has expiratory wheezes. Pt also has been c/o difficulty urinating.

## 2016-05-20 NOTE — Telephone Encounter (Signed)
Call placed to patient son Evan [name] with CXR results.   Advised of MD recommendations and NO for ABTx x2. Requested rto have ABTx sent to [facility] instead of [facility]   Also requested refill on Hycodan. MD made aware and approved. Prescription printed. Patient son made aware to come to office to pick up after 2pm

## 2016-05-21 ENCOUNTER — Encounter (HOSPITAL_COMMUNITY): Payer: Self-pay | Admitting: Family Medicine

## 2016-05-21 ENCOUNTER — Emergency Department (HOSPITAL_COMMUNITY): Payer: Medicare HMO

## 2016-05-21 DIAGNOSIS — K210 Gastro-esophageal reflux disease with esophagitis: Secondary | ICD-10-CM | POA: Diagnosis not present

## 2016-05-21 DIAGNOSIS — I480 Paroxysmal atrial fibrillation: Secondary | ICD-10-CM | POA: Diagnosis not present

## 2016-05-21 DIAGNOSIS — N179 Acute kidney failure, unspecified: Secondary | ICD-10-CM | POA: Diagnosis present

## 2016-05-21 DIAGNOSIS — J181 Lobar pneumonia, unspecified organism: Secondary | ICD-10-CM

## 2016-05-21 DIAGNOSIS — F411 Generalized anxiety disorder: Secondary | ICD-10-CM | POA: Diagnosis not present

## 2016-05-21 DIAGNOSIS — I2510 Atherosclerotic heart disease of native coronary artery without angina pectoris: Secondary | ICD-10-CM | POA: Diagnosis present

## 2016-05-21 DIAGNOSIS — R05 Cough: Secondary | ICD-10-CM | POA: Diagnosis present

## 2016-05-21 DIAGNOSIS — K219 Gastro-esophageal reflux disease without esophagitis: Secondary | ICD-10-CM | POA: Diagnosis present

## 2016-05-21 DIAGNOSIS — I4891 Unspecified atrial fibrillation: Secondary | ICD-10-CM | POA: Diagnosis present

## 2016-05-21 DIAGNOSIS — I25708 Atherosclerosis of coronary artery bypass graft(s), unspecified, with other forms of angina pectoris: Secondary | ICD-10-CM

## 2016-05-21 DIAGNOSIS — I4821 Permanent atrial fibrillation: Secondary | ICD-10-CM | POA: Diagnosis present

## 2016-05-21 DIAGNOSIS — D696 Thrombocytopenia, unspecified: Secondary | ICD-10-CM

## 2016-05-21 DIAGNOSIS — C3491 Malignant neoplasm of unspecified part of right bronchus or lung: Secondary | ICD-10-CM

## 2016-05-21 DIAGNOSIS — J1100 Influenza due to unidentified influenza virus with unspecified type of pneumonia: Secondary | ICD-10-CM | POA: Diagnosis not present

## 2016-05-21 DIAGNOSIS — I110 Hypertensive heart disease with heart failure: Secondary | ICD-10-CM | POA: Diagnosis present

## 2016-05-21 DIAGNOSIS — I10 Essential (primary) hypertension: Secondary | ICD-10-CM

## 2016-05-21 DIAGNOSIS — Z9841 Cataract extraction status, right eye: Secondary | ICD-10-CM | POA: Diagnosis not present

## 2016-05-21 DIAGNOSIS — J101 Influenza due to other identified influenza virus with other respiratory manifestations: Secondary | ICD-10-CM

## 2016-05-21 DIAGNOSIS — Y842 Radiological procedure and radiotherapy as the cause of abnormal reaction of the patient, or of later complication, without mention of misadventure at the time of the procedure: Secondary | ICD-10-CM | POA: Diagnosis present

## 2016-05-21 DIAGNOSIS — I252 Old myocardial infarction: Secondary | ICD-10-CM | POA: Diagnosis not present

## 2016-05-21 DIAGNOSIS — Z85118 Personal history of other malignant neoplasm of bronchus and lung: Secondary | ICD-10-CM | POA: Diagnosis not present

## 2016-05-21 DIAGNOSIS — Z923 Personal history of irradiation: Secondary | ICD-10-CM | POA: Diagnosis not present

## 2016-05-21 DIAGNOSIS — E871 Hypo-osmolality and hyponatremia: Secondary | ICD-10-CM | POA: Diagnosis present

## 2016-05-21 DIAGNOSIS — F329 Major depressive disorder, single episode, unspecified: Secondary | ICD-10-CM | POA: Diagnosis present

## 2016-05-21 DIAGNOSIS — J1000 Influenza due to other identified influenza virus with unspecified type of pneumonia: Secondary | ICD-10-CM | POA: Diagnosis present

## 2016-05-21 DIAGNOSIS — A4189 Other specified sepsis: Secondary | ICD-10-CM | POA: Diagnosis present

## 2016-05-21 DIAGNOSIS — I5032 Chronic diastolic (congestive) heart failure: Secondary | ICD-10-CM | POA: Diagnosis present

## 2016-05-21 DIAGNOSIS — D649 Anemia, unspecified: Secondary | ICD-10-CM | POA: Diagnosis present

## 2016-05-21 DIAGNOSIS — R6510 Systemic inflammatory response syndrome (SIRS) of non-infectious origin without acute organ dysfunction: Secondary | ICD-10-CM

## 2016-05-21 DIAGNOSIS — Z951 Presence of aortocoronary bypass graft: Secondary | ICD-10-CM

## 2016-05-21 DIAGNOSIS — G9341 Metabolic encephalopathy: Secondary | ICD-10-CM | POA: Diagnosis present

## 2016-05-21 DIAGNOSIS — Z961 Presence of intraocular lens: Secondary | ICD-10-CM | POA: Diagnosis present

## 2016-05-21 DIAGNOSIS — Z947 Corneal transplant status: Secondary | ICD-10-CM | POA: Diagnosis not present

## 2016-05-21 DIAGNOSIS — M069 Rheumatoid arthritis, unspecified: Secondary | ICD-10-CM | POA: Diagnosis present

## 2016-05-21 DIAGNOSIS — R000 Tachycardia, unspecified: Secondary | ICD-10-CM | POA: Diagnosis present

## 2016-05-21 LAB — COMPREHENSIVE METABOLIC PANEL
ALT: 11 U/L — ABNORMAL LOW (ref 17–63)
AST: 29 U/L (ref 15–41)
Albumin: 3.4 g/dL — ABNORMAL LOW (ref 3.5–5.0)
Alkaline Phosphatase: 70 U/L (ref 38–126)
Anion gap: 13 (ref 5–15)
BUN: 25 mg/dL — ABNORMAL HIGH (ref 6–20)
CO2: 18 mmol/L — ABNORMAL LOW (ref 22–32)
Calcium: 8.6 mg/dL — ABNORMAL LOW (ref 8.9–10.3)
Chloride: 103 mmol/L (ref 101–111)
Creatinine, Ser: 1.31 mg/dL — ABNORMAL HIGH (ref 0.61–1.24)
GFR calc Af Amer: 55 mL/min — ABNORMAL LOW (ref >60)
GFR calc non Af Amer: 48 mL/min — ABNORMAL LOW (ref >60)
Glucose, Bld: 102 mg/dL — ABNORMAL HIGH (ref 65–99)
Potassium: 3.9 mmol/L (ref 3.5–5.1)
Sodium: 134 mmol/L — ABNORMAL LOW (ref 135–145)
Total Bilirubin: 1.2 mg/dL (ref 0.3–1.2)
Total Protein: 6.5 g/dL (ref 6.5–8.1)

## 2016-05-21 LAB — RESPIRATORY PANEL BY PCR
Adenovirus: NOT DETECTED
Bordetella pertussis: NOT DETECTED
Chlamydophila pneumoniae: NOT DETECTED
Coronavirus 229E: NOT DETECTED
Coronavirus HKU1: NOT DETECTED
Coronavirus NL63: NOT DETECTED
Coronavirus OC43: NOT DETECTED
Influenza A H3: DETECTED — AB
Influenza B: NOT DETECTED
Metapneumovirus: NOT DETECTED
Mycoplasma pneumoniae: NOT DETECTED
Parainfluenza Virus 1: NOT DETECTED
Parainfluenza Virus 2: NOT DETECTED
Parainfluenza Virus 3: NOT DETECTED
Parainfluenza Virus 4: NOT DETECTED
Respiratory Syncytial Virus: NOT DETECTED
Rhinovirus / Enterovirus: NOT DETECTED

## 2016-05-21 LAB — D-DIMER, QUANTITATIVE: D-Dimer, Quant: 0.94 ug/mL-FEU — ABNORMAL HIGH (ref 0.00–0.50)

## 2016-05-21 LAB — URINALYSIS, ROUTINE W REFLEX MICROSCOPIC
Bilirubin Urine: NEGATIVE
Glucose, UA: NEGATIVE mg/dL
Hgb urine dipstick: NEGATIVE
Ketones, ur: NEGATIVE mg/dL
Leukocytes, UA: NEGATIVE
Nitrite: NEGATIVE
Protein, ur: 30 mg/dL — AB
Specific Gravity, Urine: 1.019 (ref 1.005–1.030)
pH: 5 (ref 5.0–8.0)

## 2016-05-21 LAB — CBC WITH DIFFERENTIAL/PLATELET
Basophils Absolute: 0 10*3/uL (ref 0.0–0.1)
Basophils Relative: 0 %
Eosinophils Absolute: 0 10*3/uL (ref 0.0–0.7)
Eosinophils Relative: 0 %
HCT: 37.8 % — ABNORMAL LOW (ref 39.0–52.0)
Hemoglobin: 12.4 g/dL — ABNORMAL LOW (ref 13.0–17.0)
Lymphocytes Relative: 42 %
Lymphs Abs: 2.1 10*3/uL (ref 0.7–4.0)
MCH: 30.4 pg (ref 26.0–34.0)
MCHC: 32.8 g/dL (ref 30.0–36.0)
MCV: 92.6 fL (ref 78.0–100.0)
Monocytes Absolute: 0.7 10*3/uL (ref 0.1–1.0)
Monocytes Relative: 13 %
Neutro Abs: 2.2 10*3/uL (ref 1.7–7.7)
Neutrophils Relative %: 45 %
Platelets: 58 10*3/uL — ABNORMAL LOW (ref 150–400)
RBC: 4.08 MIL/uL — ABNORMAL LOW (ref 4.22–5.81)
RDW: 14.1 % (ref 11.5–15.5)
WBC: 5 10*3/uL (ref 4.0–10.5)

## 2016-05-21 LAB — TROPONIN I
Troponin I: 0.06 ng/mL (ref <0.03)
Troponin I: 0.1 ng/mL (ref <0.03)

## 2016-05-21 LAB — BRAIN NATRIURETIC PEPTIDE: B Natriuretic Peptide: 585.3 pg/mL — ABNORMAL HIGH (ref 0.0–100.0)

## 2016-05-21 LAB — RENAL FUNCTION PANEL
Albumin: 2.6 g/dL — ABNORMAL LOW (ref 3.5–5.0)
Anion gap: 11 (ref 5–15)
BUN: 20 mg/dL (ref 6–20)
CO2: 18 mmol/L — ABNORMAL LOW (ref 22–32)
Calcium: 7.5 mg/dL — ABNORMAL LOW (ref 8.9–10.3)
Chloride: 107 mmol/L (ref 101–111)
Creatinine, Ser: 1.14 mg/dL (ref 0.61–1.24)
GFR calc Af Amer: >60 mL/min (ref >60)
GFR calc non Af Amer: 56 mL/min — ABNORMAL LOW (ref >60)
Glucose, Bld: 226 mg/dL — ABNORMAL HIGH (ref 65–99)
Phosphorus: 3.3 mg/dL (ref 2.5–4.6)
Potassium: 3.7 mmol/L (ref 3.5–5.1)
Sodium: 136 mmol/L (ref 135–145)

## 2016-05-21 LAB — CBC
HCT: 31.9 % — ABNORMAL LOW (ref 39.0–52.0)
Hemoglobin: 10.5 g/dL — ABNORMAL LOW (ref 13.0–17.0)
MCH: 30.4 pg (ref 26.0–34.0)
MCHC: 32.9 g/dL (ref 30.0–36.0)
MCV: 92.5 fL (ref 78.0–100.0)
Platelets: 56 10*3/uL — ABNORMAL LOW (ref 150–400)
RBC: 3.45 MIL/uL — ABNORMAL LOW (ref 4.22–5.81)
RDW: 14.2 % (ref 11.5–15.5)
WBC: 2.6 10*3/uL — ABNORMAL LOW (ref 4.0–10.5)

## 2016-05-21 LAB — I-STAT TROPONIN, ED: Troponin i, poc: 0.06 ng/mL (ref 0.00–0.08)

## 2016-05-21 LAB — LACTIC ACID, PLASMA: Lactic Acid, Venous: 1.3 mmol/L (ref 0.5–1.9)

## 2016-05-21 LAB — HIV ANTIBODY (ROUTINE TESTING W REFLEX): HIV Screen 4th Generation wRfx: NONREACTIVE

## 2016-05-21 LAB — PROTIME-INR
INR: 1.08
Prothrombin Time: 14 seconds (ref 11.4–15.2)

## 2016-05-21 LAB — I-STAT CG4 LACTIC ACID, ED
Lactic Acid, Venous: 1.27 mmol/L (ref 0.5–1.9)
Lactic Acid, Venous: 2.34 mmol/L (ref 0.5–1.9)

## 2016-05-21 LAB — STREP PNEUMONIAE URINARY ANTIGEN: Strep Pneumo Urinary Antigen: POSITIVE — AB

## 2016-05-21 MED ORDER — HYDROCODONE-ACETAMINOPHEN 5-325 MG PO TABS: 1.0000 | ORAL_TABLET | ORAL | Status: DC | PRN

## 2016-05-21 MED ORDER — PREDNISONE 20 MG PO TABS
40.0000 mg | ORAL_TABLET | Freq: Every day | ORAL | Status: DC
  Administered 2016-05-21 – 2016-05-23 (×3): 40 mg via ORAL
  Filled 2016-05-21 (×3): qty 2

## 2016-05-21 MED ORDER — IOPAMIDOL (ISOVUE-370) INJECTION 76%
INTRAVENOUS | Status: AC
  Administered 2016-05-21: 100 mL
  Filled 2016-05-21: qty 100

## 2016-05-21 MED ORDER — FLUOXETINE HCL 20 MG PO CAPS
20.0000 mg | ORAL_CAPSULE | Freq: Every day | ORAL | Status: DC
  Administered 2016-05-21 – 2016-05-23 (×3): 20 mg via ORAL
  Filled 2016-05-21 (×4): qty 1

## 2016-05-21 MED ORDER — SODIUM CHLORIDE 0.9% FLUSH
3.0000 mL | Freq: Two times a day (BID) | INTRAVENOUS | Status: DC
  Administered 2016-05-21 – 2016-05-23 (×4): 3 mL via INTRAVENOUS

## 2016-05-21 MED ORDER — TAMSULOSIN HCL 0.4 MG PO CAPS
0.4000 mg | ORAL_CAPSULE | Freq: Every day | ORAL | Status: DC
  Administered 2016-05-21 – 2016-05-22 (×2): 0.4 mg via ORAL
  Filled 2016-05-21 (×2): qty 1

## 2016-05-21 MED ORDER — DEXTROSE 5 % IV SOLN
500.0000 mg | Freq: Every day | INTRAVENOUS | Status: DC
  Administered 2016-05-21 – 2016-05-22 (×2): 500 mg via INTRAVENOUS
  Filled 2016-05-21 (×2): qty 500

## 2016-05-21 MED ORDER — DICLOFENAC SODIUM 1 % TD GEL: 2.0000 g | Freq: Every day | TRANSDERMAL | Status: DC | PRN

## 2016-05-21 MED ORDER — LEFLUNOMIDE 20 MG PO TABS
10.0000 mg | ORAL_TABLET | Freq: Every day | ORAL | Status: DC
  Administered 2016-05-21 – 2016-05-23 (×3): 10 mg via ORAL
  Filled 2016-05-21 (×4): qty 1

## 2016-05-21 MED ORDER — ATENOLOL 50 MG PO TABS
50.0000 mg | ORAL_TABLET | Freq: Every day | ORAL | Status: DC
  Administered 2016-05-21 – 2016-05-23 (×2): 50 mg via ORAL
  Filled 2016-05-21 (×2): qty 1

## 2016-05-21 MED ORDER — TRAZODONE HCL 50 MG PO TABS
50.0000 mg | ORAL_TABLET | Freq: Every evening | ORAL | Status: DC | PRN
  Administered 2016-05-21: 50 mg via ORAL
  Filled 2016-05-21: qty 1

## 2016-05-21 MED ORDER — PANTOPRAZOLE SODIUM 40 MG PO TBEC
40.0000 mg | DELAYED_RELEASE_TABLET | Freq: Every day | ORAL | Status: DC
  Administered 2016-05-21 – 2016-05-23 (×3): 40 mg via ORAL
  Filled 2016-05-21 (×3): qty 1

## 2016-05-21 MED ORDER — ACETAMINOPHEN 325 MG PO TABS: 650.0000 mg | ORAL_TABLET | Freq: Four times a day (QID) | ORAL | Status: DC | PRN

## 2016-05-21 MED ORDER — SODIUM CHLORIDE 0.9 % IV SOLN: 250.0000 mL | INTRAVENOUS | Status: DC | PRN

## 2016-05-21 MED ORDER — VANCOMYCIN HCL IN DEXTROSE 1-5 GM/200ML-% IV SOLN: 1000.0000 mg | INTRAVENOUS | Status: DC

## 2016-05-21 MED ORDER — ALBUTEROL SULFATE (2.5 MG/3ML) 0.083% IN NEBU: 2.5000 mg | INHALATION_SOLUTION | RESPIRATORY_TRACT | Status: DC | PRN

## 2016-05-21 MED ORDER — PIPERACILLIN-TAZOBACTAM 3.375 G IVPB: 3.3750 g | Freq: Three times a day (TID) | INTRAVENOUS | Status: DC

## 2016-05-21 MED ORDER — OSELTAMIVIR PHOSPHATE 30 MG PO CAPS
30.0000 mg | ORAL_CAPSULE | Freq: Two times a day (BID) | ORAL | Status: DC
  Administered 2016-05-21 – 2016-05-23 (×5): 30 mg via ORAL
  Filled 2016-05-21 (×5): qty 1

## 2016-05-21 MED ORDER — DEXTROSE 5 % IV SOLN
1.0000 g | Freq: Every day | INTRAVENOUS | Status: DC
  Administered 2016-05-21 – 2016-05-23 (×3): 1 g via INTRAVENOUS
  Filled 2016-05-21 (×3): qty 10

## 2016-05-21 MED ORDER — HYDROCODONE-HOMATROPINE 5-1.5 MG/5ML PO SYRP: 5.0000 mL | ORAL_SOLUTION | Freq: Four times a day (QID) | ORAL | Status: DC | PRN

## 2016-05-21 MED ORDER — SODIUM CHLORIDE 0.9% FLUSH: 3.0000 mL | INTRAVENOUS | Status: DC | PRN

## 2016-05-21 MED ORDER — CLOPIDOGREL BISULFATE 75 MG PO TABS
75.0000 mg | ORAL_TABLET | Freq: Every day | ORAL | Status: DC
  Administered 2016-05-21 – 2016-05-23 (×3): 75 mg via ORAL
  Filled 2016-05-21 (×3): qty 1

## 2016-05-21 MED ORDER — ROSUVASTATIN CALCIUM 10 MG PO TABS
5.0000 mg | ORAL_TABLET | Freq: Every day | ORAL | Status: DC
  Administered 2016-05-21 – 2016-05-23 (×4): 5 mg via ORAL
  Filled 2016-05-21 (×4): qty 1

## 2016-05-21 MED ORDER — LORAZEPAM 1 MG PO TABS
1.0000 mg | ORAL_TABLET | Freq: Four times a day (QID) | ORAL | Status: DC | PRN
  Administered 2016-05-21 – 2016-05-22 (×4): 1 mg via ORAL
  Filled 2016-05-21 (×4): qty 1

## 2016-05-21 NOTE — Progress Notes (Addendum)
PROGRESS NOTE    Evan [name]  WUJ:[id number] DOB: [birth year] DOA: 05/20/2016 PCP: Odette Fraction, MD   Brief Narrative: 83 y.o. male with medical history significant for coronary artery disease status post CABG, anxiety, depression, GERD, rheumatoid arthritis, and non-small cell lung cancer involving the right lung, now presenting to the emergency department with fevers, chills, productive cough, and dyspnea. The patient was evaluated in the oncology clinic yesterday and noted to have a cough productive of thick yellow sputum. Chest x-ray was obtained on an outpatient basis, said to be suggestive of a right lower lobe infiltrate, and azithromycin and cefuroxime were called into the patient's pharmacy. Shortly after returning home, however, the patient developed high fevers and worsening in his dyspnea. In ED, patient is found to be febrile to 39.3 C, saturating adequately on room air, tachycardic in the 120s, tachypneic to 30, and with soft but stable blood pressure. EKG features in atrial fibrillation with rate 118 and LVH by voltage criteria with secondary repolarization abnormality. Chest x-ray is notable for a persistent nodular right lower lobe infiltrate. Assessment & Plan:  # Sepsis due to possible right lower lobe pneumonia and Influenza A positive -CT scan of chest consistent with right lower lobe consolidation which could be lung mass. -Continue treatment with ceftriaxone and azithromycin -Follow up culture results -Patient is not hypoxic. -Influenza A positive, started tamiflu and droplet precaution.  #New-onset atrial fibrillation likely in the setting of sepsis: -Heart rate is controlled -Continue to monitor in telemetry. -Patient has mild elevation in troponin likely in the setting of sepsis and if he. -Pending echocardiogram -Follow-up TSH level -Cardiology consulted. I discussed with Dr.Koneswaran  #Acute kidney injury: Serum creatinine level improved. Monitor  BMP.  #Normal sided anemia, thrombus cytopenia: Likely in the setting of malignancy, leflunomide. Continue to monitor CBC closely. No sign of bleeding.  #Acute metabolic encephalopathy: Patient was somnolent this morning. Reportedly he did not have good sleeper last few days. He has no focal neurological deficit. He also begins with the name and can talk. If no improvement in mental status I will consider CT head. -Discontinue hydrocortisone. On Tylenol for pain management.  #History of coronary artery disease status post CABG: Continue cardiac medication including Plavix, atenolol, statin.  #Chronic diastolic congestive heart failure: I do not think patient has an acute exacerbation this time. Pending echocardiogram.  #Essential hypertension: Monitor blood pressure. Continue atenolol  #Right lung malignancy: Follows up with oncologist. Complicated chemotherapy and radiation therapy in December 2017 as per patient  #Rheumatoid arthritis: Continue home medication. Clinically stable.   Principal Problem:   Pneumonia Active Problems:   GERD (gastroesophageal reflux disease)   Hypertension   S/P CABG (coronary artery bypass graft)   Coronary atherosclerosis of native coronary artery   Essential hypertension   GAD (generalized anxiety disorder)   Adenocarcinoma of right lung (HCC)   Atrial fibrillation (HCC)  DVT prophylaxis: SCD. Patient has thrombocytopenia. Code Status: Full code Family Communication: Discussed with the patient's daughter and her husband at bedside Disposition Plan: Likely discharge home in 2-3 days    Consultants:   Consulted cardiologist  Procedures: None Antimicrobials: Ceftriaxone and azithromycin.  Subjective: Patient was seen and examined at bedside. Patient was somnolent however he was able to awake with his name. Able to answer appropriately. He feels tired but denied nausea vomiting headache dizziness chest pain or shortness of breath. Patient's  daughter at bedside  Objective: Vitals:   05/21/16 0345 05/21/16 0400 05/21/16 9562 05/21/16 1308  BP: 110/64 113/66 (!) 114/54 116/69  Pulse: 92 95 94 84  Resp: 25 (!) 32 (!) 34 (!) 22  Temp:   98 F (36.7 C) 97.7 F (36.5 C)  TempSrc:   Axillary Axillary  SpO2: 97% 100% 99% 99%  Weight:   71.2 kg (157 lb)     Intake/Output Summary (Last 24 hours) at 05/21/16 1003 Last data filed at 05/21/16 0941  Gross per 24 hour  Intake             3520 ml  Output              525 ml  Net             2995 ml   Filed Weights   05/21/16 0525  Weight: 71.2 kg (157 lb)    Examination:  General exam: Appears calm and comfortable  Respiratory system: Clear to auscultation. Respiratory effort normal.  Cardiovascular system: S1 & S2 heard, Irregular heart rate.  No pedal edema. Gastrointestinal system: Abdomen is nondistended, soft and nontender. Normal bowel sounds heard. Central nervous system: Alert and oriented. No focal neurological deficits. Extremities: Symmetric 5 x 5 power. Skin: No rashes, lesions or ulcers Psychiatry: Somnolent     Data Reviewed: I have personally reviewed following labs and imaging studies  CBC:  Recent Labs Lab 05/20/16 2345  WBC 5.0  NEUTROABS 2.2  HGB 12.4*  HCT 37.8*  MCV 92.6  PLT 58*   Basic Metabolic Panel:  Recent Labs Lab 05/20/16 2345 05/21/16 0839  NA 134* 136  K 3.9 3.7  CL 103 107  CO2 18* 18*  GLUCOSE 102* 226*  BUN 25* 20  CREATININE 1.31* 1.14  CALCIUM 8.6* 7.5*  PHOS  --  3.3   GFR: Estimated Creatinine Clearance: 45 mL/min (by C-G formula based on SCr of 1.14 mg/dL). Liver Function Tests:  Recent Labs Lab 05/20/16 2345 05/21/16 0839  AST 29  --   ALT 11*  --   ALKPHOS 70  --   BILITOT 1.2  --   PROT 6.5  --   ALBUMIN 3.4* 2.6*   No results for input(s): LIPASE, AMYLASE in the last 168 hours. No results for input(s): AMMONIA in the last 168 hours. Coagulation Profile:  Recent Labs Lab 05/20/16 2345   INR 1.08   Cardiac Enzymes:  Recent Labs Lab 05/21/16 0628  TROPONINI 0.06*   BNP (last 3 results) No results for input(s): PROBNP in the last 8760 hours. HbA1C: No results for input(s): HGBA1C in the last 72 hours. CBG: No results for input(s): GLUCAP in the last 168 hours. Lipid Profile: No results for input(s): CHOL, HDL, LDLCALC, TRIG, CHOLHDL, LDLDIRECT in the last 72 hours. Thyroid Function Tests: No results for input(s): TSH, T4TOTAL, FREET4, T3FREE, THYROIDAB in the last 72 hours. Anemia Panel: No results for input(s): VITAMINB12, FOLATE, FERRITIN, TIBC, IRON, RETICCTPCT in the last 72 hours. Sepsis Labs:  Recent Labs Lab 05/21/16 0005 05/21/16 0309 05/21/16 9678  LATICACIDVEN 2.34* 1.27 1.3    No results found for this or any previous visit (from the past 240 hour(s)).       Radiology Studies: Dg Chest 2 View  Result Date: 05/20/2016 CLINICAL DATA:  Cough and weakness. EXAM: CHEST  2 VIEW COMPARISON:  CT 04/05/2016.  Chest x-ray 12/30/2015 . FINDINGS: Prior CABG. Cardiomegaly with normal pulmonary vascularity. Persistent right base nodular infiltrate. Findings may be related to radiation. Persistent right base mass lesion. Similar findings noted on prior CT  of 04/05/2016. Continued follow-up exams suggested. No acute bony abnormality. Surgical clips right upper quadrant. IMPRESSION: 1. Persistent nodular right lower lobe infiltrate. Findings may be related radiation. Persistent right base mass lesion. Similar findings noted on prior CT of 04/05/2016. Continued follow-up exam suggested. 2. Prior CABG.  Cardiomegaly. Electronically Signed   By: [place]   On: 05/20/2016 12:14   Ct Angio Chest Pe W And/or Wo Contrast  Result Date: 05/21/2016 CLINICAL DATA:  Fever, shortness of breath, positive D-dimer. History of lung cancer. EXAM: CT ANGIOGRAPHY CHEST WITH CONTRAST TECHNIQUE: Multidetector CT imaging of the chest was performed using the standard protocol  during bolus administration of intravenous contrast. Multiplanar CT image reconstructions and MIPs were obtained to evaluate the vascular anatomy. CONTRAST:  100 mL Isovue 370 COMPARISON:  04/05/2016 FINDINGS: Cardiovascular: There is good opacification of the central and segmental pulmonary arteries. No focal filling defects demonstrated. No evidence of significant pulmonary embolus. Normal heart size. No pericardial effusions. Coronary artery calcifications. Reflux of contrast material into the hepatic veins suggest right heart failure. Normal caliber thoracic aorta with calcification. Mediastinum/Nodes: Postoperative changes in the mediastinum. Scattered calcified lymph nodes. No significant lymphadenopathy. Esophagus is decompressed. Lungs/Pleura: Right lower lung posterior mass lesions seen previously is now obscured by surrounding consolidation. Patchy areas of infiltration in the right middle lung and right lower lung similar to prior study, likely radiation change. Basilar interstitial changes in the lungs new since prior study may represent edema. Evaluation is limited due to motion artifact. No pneumothorax. No pleural effusions. Airways are patent. Upper Abdomen: Surgical absence of the gallbladder. Vascular calcifications in the upper abdomen. Musculoskeletal: Degenerative changes in the spine. No destructive bone lesions. Review of the MIP images confirms the above findings. IMPRESSION: No evidence of significant pulmonary embolus. Consolidation in the right lower lung obscures visualization of known right lower lung mass. Patchy infiltration in the lungs consistent with radiation change and new interstitial edema. Electronically Signed   By: Lucienne Capers M.D.   On: 05/21/2016 02:52   Dg Chest Portable 1 View  Result Date: 05/21/2016 CLINICAL DATA:  Initial evaluation for increase shortness of breath. EXAM: PORTABLE CHEST 1 VIEW COMPARISON:  Prior radiograph from 05/20/2016 as well as CT from  04/05/2016. FINDINGS: Median sternotomy wires underlying surgical clips, stable. Mild cardiomegaly unchanged. Mediastinal silhouette within normal limits. Aortic atherosclerosis noted. Lungs are mildly hypoinflated. Persistent patchy and nodular opacity at the right lung base, similar to previous studies, and likely reflects post radiation changes and/or known mass within this region. Increased patchy density at the left lung base favored to largely reflect atelectasis/ bronchovascular crowding. No other focal airspace disease. No pulmonary edema. No significant pleural effusion on this single view of the chest. No pneumothorax. No acute osseous abnormality. IMPRESSION: 1. Persistent hazy and nodular density at the right lung base, most likely in large part reflecting post radiation changes and/or residual mass as seen on recent CT. Continued follow-up suggested. 2. Shallow lung inflation with increased left basilar opacity, most likely atelectasis/bronchovascular crowding. 3. Stable cardiomegaly with sequelae of prior CABG. No pulmonary edema. Electronically Signed   By: Jeannine Boga M.D.   On: 05/21/2016 00:20        Scheduled Meds: . atenolol  50 mg Oral Daily  . azithromycin  500 mg Intravenous Daily  . cefTRIAXone (ROCEPHIN)  IV  1 g Intravenous Daily  . clopidogrel  75 mg Oral Daily  . FLUoxetine  20 mg Oral Daily  . leflunomide  10 mg Oral Daily  . pantoprazole  40 mg Oral Daily  . predniSONE  40 mg Oral Q breakfast  . rosuvastatin  5 mg Oral Daily  . sodium chloride flush  3 mL Intravenous Q12H  . tamsulosin  0.4 mg Oral QPC supper   Continuous Infusions:   LOS: 0 days    Brigham Cobbins Tanna Furry, MD [facility] Pager [phone]  If 7PM-7AM, please contact night-coverage [email or web address] Password [email or web address] 05/21/2016, 10:03 AM

## 2016-05-21 NOTE — Progress Notes (Signed)
Pharmacy Antibiotic Note  Evan [name] is a 83 y.o. male admitted on 05/20/2016 with sepsis.  Pharmacy has been consulted for Vancomycin/Zosyn dosing. WBC WNL, mild bump in Scr, pt has NSCLC.   Plan: Vancomycin 1000 mg IV q24h Zosyn 3.375G IV q8h to be infused over 4 hours  Trend WBC, temp, renal function  F/U infectious work-up Drug levels as indicated  Temp (24hrs), Avg:102.8 F (39.3 C), Min:102.8 F (39.3 C), Max:102.8 F (39.3 C)   Recent Labs Lab 05/20/16 2345 05/21/16 0005  WBC 5.0  --   CREATININE 1.31*  --   LATICACIDVEN  --  2.34*    Estimated Creatinine Clearance: 39.2 mL/min (by C-G formula based on SCr of 1.31 mg/dL (H)).    Allergies  Allergen Reactions  . Feldene [Piroxicam] Other (See Comments)    REACTION: Blisters  . Statins Other (See Comments)    Myalgias  . Doxycycline Other (See Comments)    REACTION:  unknown  . Latex Rash  . Tequin Anxiety  . Valium Other (See Comments)    REACTION:  unknown   Narda Bonds 05/21/2016 1:34 AM

## 2016-05-21 NOTE — ED Notes (Signed)
Patient transported to CT 

## 2016-05-21 NOTE — H&P (Signed)
History and Physical    Evan [name] RSW:[id number] DOB: 1927/12/02 DOA: 05/20/2016  PCP: Odette Fraction, MD   Patient coming from: Home  Chief Complaint: Fevers, cough, dyspnea   HPI: Evan [name] is a 83 y.o. male with medical history significant for coronary artery disease status post CABG, anxiety, depression, GERD, rheumatoid arthritis, and non-small cell lung cancer involving the right lung, now presenting to the emergency department with fevers, chills, productive cough, and dyspnea. The patient was evaluated in the oncology clinic yesterday and noted to have a cough productive of thick yellow sputum. Chest x-ray was obtained on an outpatient basis, said to be suggestive of a right lower lobe infiltrate, and azithromycin and cefuroxime were called into the patient's pharmacy. Shortly after returning home, however, the patient developed high fevers and worsening in his dyspnea. He was treated with Tylenol and albuterol neb at home, but continued to be febrile and short of breath, prompting his presentation to the ED. Patient denies chest pain or palpitations and denies lower extremity edema or tenderness. He does not use supplemental oxygen at home.    ED Course: Upon arrival to the ED, patient is found to be febrile to 39.3 C, saturating adequately on room air, tachycardic in the 120s, tachypneic to 30, and with soft but stable blood pressure. EKG features in atrial fibrillation with rate 118 and LVH by voltage criteria with secondary repolarization abnormality. Chest x-ray is notable for a persistent nodular right lower lobe infiltrate. Chemistry panel reveals a mild hyponatremia, BUN of 25, and serum creatinine 1.31, up from an apparent baseline of approximately 1. CBC is notable for a mild normocytic anemia with hemoglobin 12.4, and they worsened chronic thrombocytopenia with platelets 58,000. Lactic acid is elevated to 2.34, INR is within the normal limits, troponin is normal, and BNP is  elevated to a value of 585. Urinalysis is unremarkable. D-dimer was obtained and positive at 0.94. CT PE study is negative for PE, but notable for consolidation in the right lower lobe which obscures the known right lung mass. Also noted on CTA is patchy infiltration consistent with radiation changes and new interstitial edema. Blood cultures were obtained in the emergency department and the patient was given 30 cc/kg bolus of normal saline. He was treated with acetaminophen and empiric vancomycin and Zosyn. Lactic acid normalized 1.27 after the fluids and the tachycardia resolved. The patient remained in atrial fibrillation. The pressure remains soft and there is a new oxygen requirement of 2 L/m. Patient will be admitted to the telemetry unit for ongoing evaluation and management of sepsis secondary to pneumonia with atrial fibrillation which appears to be new.  Review of Systems:  All other systems reviewed and apart from HPI, are negative.  Past Medical History:  Diagnosis Date  . Adenocarcinoma of right lung ([place]) 01/06/2016  . Anginal chest pain at rest [facility])    Chronic non, controlled on Lorazepam  . Anxiety   . Arthritis    "all over"   . BPH (benign prostatic hyperplasia)   . CAD (coronary artery disease)   . Chronic lower back pain   . Complication of anesthesia    "problems making water afterwards"  . Depression   . DJD (degenerative joint disease)   . Esophageal ulcer without bleeding    bid ppi indefinitely  . Family history of adverse reaction to anesthesia    "children w/PONV"  . GERD (gastroesophageal reflux disease)   . Headache    "couple/week maybe" (09/04/2015)  .  Hemochromatosis    Possible, elevated iron stores, hook-like osteophytes on hand films, normal LFTs  . Hepatitis    "yellow jaundice as a baby"  . History of ASCVD    MULTIVESSEL  . Hyperlipidemia   . Hypertension   . Myocardial infarction 1999  . Osteoarthritis   . Pneumonia    "several times; got a  little now" (09/04/2015)  . Rheumatoid arthritis [facility])     Past Surgical History:  Procedure Laterality Date  . ARM SKIN LESION BIOPSY / EXCISION Left 09/02/2015  . CATARACT EXTRACTION W/ INTRAOCULAR LENS  IMPLANT, BILATERAL Bilateral   . CHOLECYSTECTOMY OPEN    . CORNEAL TRANSPLANT Bilateral    "one at [facility]; one at [facility]"  . CORONARY ANGIOPLASTY WITH STENT PLACEMENT    . CORONARY ARTERY BYPASS GRAFT  1999  . DESCENDING AORTIC ANEURYSM REPAIR W/ STENT     DE stent ostium into the right radial free graft at OM-, 02-2007  . ESOPHAGOGASTRODUODENOSCOPY N/A 11/09/2014   Procedure: ESOPHAGOGASTRODUODENOSCOPY (EGD);  Surgeon: [name], MD;  Location: [facility] ENDOSCOPY;  Service: Endoscopy;  Laterality: N/A;  . INGUINAL HERNIA REPAIR    . JOINT REPLACEMENT    . NASAL SINUS SURGERY    . SHOULDER OPEN ROTATOR CUFF REPAIR Right   . TOTAL KNEE ARTHROPLASTY Bilateral      reports that he has never smoked. He has quit using smokeless tobacco. His smokeless tobacco use included Chew. He reports that he does not drink alcohol or use drugs.  Allergies  Allergen Reactions  . Feldene [Piroxicam] Other (See Comments)    REACTION: Blisters  . Statins Other (See Comments)    Myalgias  . Doxycycline Other (See Comments)    REACTION:  unknown  . Latex Rash  . Tequin Anxiety  . Valium Other (See Comments)    REACTION:  unknown    Family History  Problem Relation Age of Onset  . Arthritis-Osteo Sister   . Heart attack Brother   . Heart attack Other   . Cancer Neg Hx      Prior to Admission medications   Medication Sig Start Date End Date Taking? Authorizing Provider  albuterol (VENTOLIN HFA) 108 (90 Base) MCG/ACT inhaler Inhale 1-2 puffs into the lungs every 6 (six) hours as needed for wheezing or shortness of breath. 02/18/16  Yes [name], MD  atenolol (TENORMIN) 50 MG tablet TAKE 1 TABLET BY MOUTH  EVERY DAY 11/20/15  Yes Alycia Rossetti, MD  azithromycin [facility]) 250 MG tablet 2 tabs  poqday1, 1 tab poqday 2-5 05/20/16  Yes [name], MD  cefUROXime (CEFTIN) 500 MG tablet Take 1 tablet (500 mg total) by mouth 2 (two) times daily with a meal. 05/20/16  Yes [name], MD  Cholecalciferol 2000 units TABS Take 1 tablet (2,000 Units total) by mouth daily. Patient taking differently: Take 1 tablet by mouth once a week.  02/04/16  Yes [name], MD  clopidogrel (PLAVIX) 75 MG tablet Take 1 tablet (75 mg total) by mouth daily. 11/02/15  Yes Jettie Booze, MD  Cyanocobalamin (VITAMIN B 12 PO) Take 1 tablet by mouth daily.    Yes Historical Provider, MD  diclofenac sodium (VOLTAREN) 1 % GEL Apply 2 g topically daily as needed (for pain in knees and feet).   Yes Historical Provider, MD  FLUoxetine (PROZAC) 20 MG capsule TAKE ONE CAPSULE BY MOUTH DAILY 06/15/15  Yes [name], MD  HYDROcodone-acetaminophen (NORCO/VICODIN) 5-325 MG tablet  Take 1 tablet by mouth as needed for pain. 07/31/15  Yes Historical Provider, MD  HYDROcodone-homatropine (HYCODAN) 5-1.5 MG/5ML syrup Take 5 mLs by mouth every 6 (six) hours as needed for cough. 05/20/16  Yes [name], MD  leflunomide (ARAVA) 10 MG tablet Take 10 mg by mouth daily.  08/01/15  Yes Historical Provider, MD  LORazepam (ATIVAN) 1 MG tablet TAKE ONE (1) TABLET BY MOUTH 3 TIMES DAILY (TAKE AT EIGHT IN THE MORNING NOONAND FOUR PM) 05/04/16  Yes [name], MD  nitroGLYCERIN (NITROSTAT) 0.4 MG SL tablet Place 1 tablet (0.4 mg total) under the tongue every 5 (five) minutes as needed for chest pain. 07/14/15  Yes Jettie Booze, MD  pantoprazole (PROTONIX) 40 MG tablet Take 1 tablet (40 mg total) by mouth daily. 11/02/15  Yes Jettie Booze, MD  predniSONE (DELTASONE) 20 MG tablet Take 2 tablets (40 mg total) by mouth daily with breakfast. 05/16/16  Yes [name], MD  rosuvastatin (CRESTOR) 5 MG tablet Take 1 tablet (5 mg total) by mouth daily. 11/02/15  Yes Jettie Booze, MD    Spacer/Aero-Holding Chambers (AEROCHAMBER PLUS WITH MASK) inhaler Use as instructed 10/19/15  Yes Charlesetta Shanks, MD  tamsulosin (FLOMAX) 0.4 MG CAPS capsule Take 0.4 mg by mouth daily after supper.   Yes Historical Provider, MD  traMADol (ULTRAM) 50 MG tablet Take 1 tablet by mouth every 12 (twelve) hours as needed.  07/15/10  Yes Historical Provider, MD  traZODone (DESYREL) 50 MG tablet Take 50 mg by mouth at bedtime as needed for sleep.   Yes Historical Provider, MD  Lifitegrast Shirley Friar) 5 % SOLN Apply 1 drop to eye 2 (two) times daily. Patient not taking: Reported on 05/21/2016 08/20/15   [name], MD    Physical Exam: Vitals:   05/21/16 0200 05/21/16 0256 05/21/16 0300 05/21/16 0315  BP: 124/91 100/72 106/55 107/58  Pulse: 106 94 95 104  Resp: (!) '29 26 23 25  '$ Temp:  98.2 F (36.8 C)    TempSrc:  Oral    SpO2: 96% 98% 95% 99%      Constitutional: No acute distress, calm, comfortable Eyes: PERTLA, lids and conjunctivae normal ENMT: Mucous membranes are moist. Posterior pharynx clear of any exudate or lesions.   Neck: normal, supple, no masses, no thyromegaly Respiratory: Coarse rhonchi in right base and mid-lung. Normal respiratory effort. No accessory muscle use.  Cardiovascular: Rate ~100 and irregular. No extremity edema.  No significant JVD. Abdomen: No distension, no tenderness, no masses palpated. Bowel sounds normal.  Musculoskeletal: no clubbing / cyanosis. No joint deformity upper and lower extremities. Normal muscle tone.  Skin: no significant rashes, lesions, ulcers. Warm, dry, well-perfused. Neurologic: CN 2-12 grossly intact. Sensation intact, DTR normal. Strength 5/5 in all 4 limbs.  Psychiatric: Normal judgment and insight. Alert and oriented x 3. Normal mood and affect.     Labs on Admission: I have personally reviewed following labs and imaging studies  CBC:  Recent Labs Lab 05/20/16 2345  WBC 5.0  NEUTROABS 2.2  HGB 12.4*  HCT 37.8*  MCV 92.6   PLT 58*   Basic Metabolic Panel:  Recent Labs Lab 05/20/16 2345  NA 134*  K 3.9  CL 103  CO2 18*  GLUCOSE 102*  BUN 25*  CREATININE 1.31*  CALCIUM 8.6*   GFR: Estimated Creatinine Clearance: 39.2 mL/min (by C-G formula based on SCr of 1.31 mg/dL (H)). Liver Function Tests:  Recent Labs Lab 05/20/16  2345  AST 29  ALT 11*  ALKPHOS 70  BILITOT 1.2  PROT 6.5  ALBUMIN 3.4*   No results for input(s): LIPASE, AMYLASE in the last 168 hours. No results for input(s): AMMONIA in the last 168 hours. Coagulation Profile:  Recent Labs Lab 05/20/16 2345  INR 1.08   Cardiac Enzymes: No results for input(s): CKTOTAL, CKMB, CKMBINDEX, TROPONINI in the last 168 hours. BNP (last 3 results) No results for input(s): PROBNP in the last 8760 hours. HbA1C: No results for input(s): HGBA1C in the last 72 hours. CBG: No results for input(s): GLUCAP in the last 168 hours. Lipid Profile: No results for input(s): CHOL, HDL, LDLCALC, TRIG, CHOLHDL, LDLDIRECT in the last 72 hours. Thyroid Function Tests: No results for input(s): TSH, T4TOTAL, FREET4, T3FREE, THYROIDAB in the last 72 hours. Anemia Panel: No results for input(s): VITAMINB12, FOLATE, FERRITIN, TIBC, IRON, RETICCTPCT in the last 72 hours. Urine analysis:    Component Value Date/Time   COLORURINE YELLOW 05/21/2016 0016   APPEARANCEUR CLEAR 05/21/2016 0016   LABSPEC 1.019 05/21/2016 0016   PHURINE 5.0 05/21/2016 0016   GLUCOSEU NEGATIVE 05/21/2016 0016   HGBUR NEGATIVE 05/21/2016 0016   BILIRUBINUR NEGATIVE 05/21/2016 0016   KETONESUR NEGATIVE 05/21/2016 0016   PROTEINUR 30 (A) 05/21/2016 0016   UROBILINOGEN 0.2 07/13/2014 1804   NITRITE NEGATIVE 05/21/2016 0016   LEUKOCYTESUR NEGATIVE 05/21/2016 0016   Sepsis Labs: '@LABRCNTIP'$ (procalcitonin:4,lacticidven:4) )No results found for this or any previous visit (from the past 240 hour(s)).   Radiological Exams on Admission: Dg Chest 2 View  Result Date:  05/20/2016 CLINICAL DATA:  Cough and weakness. EXAM: CHEST  2 VIEW COMPARISON:  CT 04/05/2016.  Chest x-ray 12/30/2015 . FINDINGS: Prior CABG. Cardiomegaly with normal pulmonary vascularity. Persistent right base nodular infiltrate. Findings may be related to radiation. Persistent right base mass lesion. Similar findings noted on prior CT of 04/05/2016. Continued follow-up exams suggested. No acute bony abnormality. Surgical clips right upper quadrant. IMPRESSION: 1. Persistent nodular right lower lobe infiltrate. Findings may be related radiation. Persistent right base mass lesion. Similar findings noted on prior CT of 04/05/2016. Continued follow-up exam suggested. 2. Prior CABG.  Cardiomegaly. Electronically Signed   By: Fox Chase   On: 05/20/2016 12:14   Ct Angio Chest Pe W And/or Wo Contrast  Result Date: 05/21/2016 CLINICAL DATA:  Fever, shortness of breath, positive D-dimer. History of lung cancer. EXAM: CT ANGIOGRAPHY CHEST WITH CONTRAST TECHNIQUE: Multidetector CT imaging of the chest was performed using the standard protocol during bolus administration of intravenous contrast. Multiplanar CT image reconstructions and MIPs were obtained to evaluate the vascular anatomy. CONTRAST:  100 mL Isovue 370 COMPARISON:  04/05/2016 FINDINGS: Cardiovascular: There is good opacification of the central and segmental pulmonary arteries. No focal filling defects demonstrated. No evidence of significant pulmonary embolus. Normal heart size. No pericardial effusions. Coronary artery calcifications. Reflux of contrast material into the hepatic veins suggest right heart failure. Normal caliber thoracic aorta with calcification. Mediastinum/Nodes: Postoperative changes in the mediastinum. Scattered calcified lymph nodes. No significant lymphadenopathy. Esophagus is decompressed. Lungs/Pleura: Right lower lung posterior mass lesions seen previously is now obscured by surrounding consolidation. Patchy areas of  infiltration in the right middle lung and right lower lung similar to prior study, likely radiation change. Basilar interstitial changes in the lungs new since prior study may represent edema. Evaluation is limited due to motion artifact. No pneumothorax. No pleural effusions. Airways are patent. Upper Abdomen: Surgical absence of the gallbladder. Vascular calcifications in  the upper abdomen. Musculoskeletal: Degenerative changes in the spine. No destructive bone lesions. Review of the MIP images confirms the above findings. IMPRESSION: No evidence of significant pulmonary embolus. Consolidation in the right lower lung obscures visualization of known right lower lung mass. Patchy infiltration in the lungs consistent with radiation change and new interstitial edema. Electronically Signed   By: Lucienne Capers M.D.   On: 05/21/2016 02:52   Dg Chest Portable 1 View  Result Date: 05/21/2016 CLINICAL DATA:  Initial evaluation for increase shortness of breath. EXAM: PORTABLE CHEST 1 VIEW COMPARISON:  Prior radiograph from 05/20/2016 as well as CT from 04/05/2016. FINDINGS: Median sternotomy wires underlying surgical clips, stable. Mild cardiomegaly unchanged. Mediastinal silhouette within normal limits. Aortic atherosclerosis noted. Lungs are mildly hypoinflated. Persistent patchy and nodular opacity at the right lung base, similar to previous studies, and likely reflects post radiation changes and/or known mass within this region. Increased patchy density at the left lung base favored to largely reflect atelectasis/ bronchovascular crowding. No other focal airspace disease. No pulmonary edema. No significant pleural effusion on this single view of the chest. No pneumothorax. No acute osseous abnormality. IMPRESSION: 1. Persistent hazy and nodular density at the right lung base, most likely in large part reflecting post radiation changes and/or residual mass as seen on recent CT. Continued follow-up suggested. 2.  Shallow lung inflation with increased left basilar opacity, most likely atelectasis/bronchovascular crowding. 3. Stable cardiomegaly with sequelae of prior CABG. No pulmonary edema. Electronically Signed   By: Jeannine Boga M.D.   On: 05/21/2016 00:20    EKG: Independently reviewed. Atrial fibrillation, rate 118, LVH with secondary repolarization abnormality.   Assessment/Plan  1. Sepsis secondary to PNA  - Pt presents with fever, tachycardia, tachypnea, elevated lactate, mild AKI, and imaging-findings suggestive of PNA  - Infiltrate noted in RLL, likely obstructive  - Blood cultures obtained in ED and sputum culture/gram stain ordered; check strep pneumo urine antigen  - 30 cc/kg NS was given in ED with normalization of HR and lactate  - Empiric vancomycin and Zosyn administered in ED; will continue antibiotics with Rocephin and azithromycin while following cultures and clinical response to treatment  - Supportive care with APAP, supplemental O2   2. Atrial fibrillation  - Appears to be new-onset, possibly secondary to the pulmonary disease  - Rate 120's initially, normalized with IVF, but remains in AF  - Moderate left-atrial enlargement noted on echo last June; mild AS also noted  - CHADS-VASc 5 (age x2, HTN, CAD, CHF) - Given thrombocytopenia to 58k, will hold-off on anticoagulation while treating the PNA and looking for other potential reversible etiologies  - Initial troponin negative, will obtain serial measurements to exclude ischemic etiology  - Thyroid studies ordered  - Update echocardiogram, monitor on telemetry, monitor lytes   3. Normocytic anemia, thrombocytopenia  - Hgb is 12.4 on admission with no sign of bleeding - Platelets 58,000 on admission, down from 100k in September 2017; possibly secondary to leflunomide; no bleeding identified, will monitor    4. CAD s/p CABG - No anginal complaints on admission - Initial troponin wnl, obtaining serial measurements as  above given new a fib  - Continue Crestor, beta-blocker, Plavix  5. Acute on chronic diastolic CHF   - Pt presents with interstitial edema, elevated BNP, and new O2 requirement; possibly secondary to new a fib - TTE (09/07/15) with EF 23-76%, grade 1 diastolic dysfunction, moderate concentric hypertrophy, mild AS, and moderate LAE  - Given the  concern for sepsis, pt was given 30 cc/kg NS bolus in ED  - Plan to SLIV now, fluid-restrict diet, and follow daily wts and I/O's  - May need diuresis once the sepsis physiology has resolved    6. GERD  - Reflux esophagitis on EGD in August 2017  - Appears to be stable - Managed with daily Protonix at home, will continue    7. Anxiety  - Stable at time of admission - Continue Prozac and prn Ativan    8. Cancer of the right lung  - Follows with oncology and completed radiation therapy in November 2017  - Currently being managed with observation, with repeat CT planned for April 2018  - Current PNA likely related as above    9. Hypertension  - BP soft on admission, but remaining stable  - Continue beta-blocker with holding parameters   10. Rheumatoid arthritis  - Stable on admission  - Continue leflunomide and prn Norco     DVT prophylaxis: SCD's  Code Status: Full  Family Communication: Wife and daughter updated at bedside Disposition Plan: Admit to telemetry Consults called: None Admission status: Inpatient    Vianne Bulls, MD [facility] Pager [phone]  If 7PM-7AM, please contact night-coverage [email or web address] Password [facility]  05/21/2016, 3:51 AM

## 2016-05-21 NOTE — ED Notes (Addendum)
Pt returned from CT and connected to the monitor

## 2016-05-21 NOTE — Consult Note (Signed)
CARDIOLOGY CONSULT NOTE  Patient ID: Evan [name] MRN: [id number] DOB/AGE: [id number] 81 y.o.  Admit date: 05/20/2016 Primary Physician: Odette Fraction, MD Referring Physician: Carolin Sicks  Reason for Consultation: atrial fibrillation  HPI: 83 yr old male with h/o with CAD and CABG in 1998 with stent in 2008. He had a normal stress test in 2012. He also has rheumatoid arthritis and non-small cell lung cancer of the right lung. He was admitted with fever/chills, cough, and shortness of breath and was found to have RLL pneumonia and influenza A.  In this context, he was also found to have new-onset atrial fibrillation but has since converted to sinus rhythm/sinus bradycardia. Denies chest pain and palpitations. Complains of sweating.  Currently on atenolol, Plavix, and Crestor.  CBC today shows Hgb 10.5, platelets 56 (137 six months ago).  Echocardiogram 09/07/15: Normal LV systolic function and regional wall motion, LVEF 65-70%, moderate LVH, grade 1 diastolic dysfunction, mild aortic stenosis, and moderate left atrial dilatation.  Another echocardiogram has been ordered and is pending.  Troponin 0.06.   Allergies  Allergen Reactions  . Feldene [Piroxicam] Other (See Comments)    REACTION: Blisters  . Statins Other (See Comments)    Myalgias  . Doxycycline Other (See Comments)    REACTION:  unknown  . Latex Rash  . Tequin Anxiety  . Valium Other (See Comments)    REACTION:  unknown    Current Facility-Administered Medications  Medication Dose Route Frequency Provider Last Rate Last Dose  . 0.9 %  sodium chloride infusion  250 mL Intravenous PRN Vianne Bulls, MD      . acetaminophen (TYLENOL) tablet 650 mg  650 mg Oral Q6H PRN Dron Tanna Furry, MD      . albuterol (PROVENTIL) (2.5 MG/3ML) 0.083% nebulizer solution 2.5 mg  2.5 mg Nebulization Q4H PRN Vianne Bulls, MD      . atenolol (TENORMIN) tablet 50 mg  50 mg Oral Daily Vianne Bulls, MD   50 mg at  05/21/16 1027  . azithromycin (ZITHROMAX) 500 mg in dextrose 5 % 250 mL IVPB  500 mg Intravenous Daily Vianne Bulls, MD   500 mg at 05/21/16 0852  . cefTRIAXone (ROCEPHIN) 1 g in dextrose 5 % 50 mL IVPB  1 g Intravenous Daily Vianne Bulls, MD   1 g at 05/21/16 0545  . clopidogrel (PLAVIX) tablet 75 mg  75 mg Oral Daily Vianne Bulls, MD   75 mg at 05/21/16 1027  . diclofenac sodium (VOLTAREN) 1 % transdermal gel 2 g  2 g Topical Daily PRN Vianne Bulls, MD      . FLUoxetine (PROZAC) capsule 20 mg  20 mg Oral Daily Vianne Bulls, MD   20 mg at 05/21/16 1027  . HYDROcodone-acetaminophen (NORCO/VICODIN) 5-325 MG per tablet 1-2 tablet  1-2 tablet Oral PRN Vianne Bulls, MD      . leflunomide (ARAVA) tablet 10 mg  10 mg Oral Daily Vianne Bulls, MD   10 mg at 05/21/16 1152  . LORazepam (ATIVAN) tablet 1 mg  1 mg Oral Q6H PRN Vianne Bulls, MD   1 mg at 05/21/16 1516  . oseltamivir (TAMIFLU) capsule 30 mg  30 mg Oral BID Dron Tanna Furry, MD   30 mg at 05/21/16 1515  . pantoprazole (PROTONIX) EC tablet 40 mg  40 mg Oral Daily Vianne Bulls, MD   40 mg at 05/21/16 1027  .  predniSONE (DELTASONE) tablet 40 mg  40 mg Oral Q breakfast Vianne Bulls, MD   40 mg at 05/21/16 0847  . rosuvastatin (CRESTOR) tablet 5 mg  5 mg Oral Daily Vianne Bulls, MD   5 mg at 05/21/16 1152  . sodium chloride flush (NS) 0.9 % injection 3 mL  3 mL Intravenous Q12H Timothy S Opyd, MD      . sodium chloride flush (NS) 0.9 % injection 3 mL  3 mL Intravenous PRN Ilene Qua Opyd, MD      . tamsulosin (FLOMAX) capsule 0.4 mg  0.4 mg Oral QPC supper Vianne Bulls, MD      . traZODone (DESYREL) tablet 50 mg  50 mg Oral QHS PRN Vianne Bulls, MD        Past Medical History:  Diagnosis Date  . Adenocarcinoma of right lung ([place]) 01/06/2016  . Anginal chest pain at rest [facility])    Chronic non, controlled on Lorazepam  . Anxiety   . Arthritis    "all over"   . BPH (benign prostatic hyperplasia)   . CAD (coronary  artery disease)   . Chronic lower back pain   . Complication of anesthesia    "problems making water afterwards"  . Depression   . DJD (degenerative joint disease)   . Esophageal ulcer without bleeding    bid ppi indefinitely  . Family history of adverse reaction to anesthesia    "children w/PONV"  . GERD (gastroesophageal reflux disease)   . Headache    "couple/week maybe" (09/04/2015)  . Hemochromatosis    Possible, elevated iron stores, hook-like osteophytes on hand films, normal LFTs  . Hepatitis    "yellow jaundice as a baby"  . History of ASCVD    MULTIVESSEL  . Hyperlipidemia   . Hypertension   . Myocardial infarction 1999  . Osteoarthritis   . Pneumonia    "several times; got a little now" (09/04/2015)  . Rheumatoid arthritis [facility])     Past Surgical History:  Procedure Laterality Date  . ARM SKIN LESION BIOPSY / EXCISION Left 09/02/2015  . CATARACT EXTRACTION W/ INTRAOCULAR LENS  IMPLANT, BILATERAL Bilateral   . CHOLECYSTECTOMY OPEN    . CORNEAL TRANSPLANT Bilateral    "one at [facility]; one at [facility]"  . CORONARY ANGIOPLASTY WITH STENT PLACEMENT    . CORONARY ARTERY BYPASS GRAFT  1999  . DESCENDING AORTIC ANEURYSM REPAIR W/ STENT     DE stent ostium into the right radial free graft at OM-, 02-2007  . ESOPHAGOGASTRODUODENOSCOPY N/A 11/09/2014   Procedure: ESOPHAGOGASTRODUODENOSCOPY (EGD);  Surgeon: [name], MD;  Location: [facility] ENDOSCOPY;  Service: Endoscopy;  Laterality: N/A;  . INGUINAL HERNIA REPAIR    . JOINT REPLACEMENT    . NASAL SINUS SURGERY    . SHOULDER OPEN ROTATOR CUFF REPAIR Right   . TOTAL KNEE ARTHROPLASTY Bilateral     Social History   Social History  . Marital status: Widowed    Spouse name: N/A  . Number of children: N/A  . Years of education: N/A   Occupational History  . Not on file.   Social History Main Topics  . Smoking status: Never Smoker  . Smokeless tobacco: Former Systems developer    Types: Chew     Comment: "quit chewing in the 1960s"  .  Alcohol use No  . Drug use: No  . Sexual activity: No   Other Topics Concern  . Not on file   Social History Narrative  .  No narrative on file     No family history of premature CAD in 1st degree relatives.  Prior to Admission medications   Medication Sig Start Date End Date Taking? Authorizing Provider  albuterol (VENTOLIN HFA) 108 (90 Base) MCG/ACT inhaler Inhale 1-2 puffs into the lungs every 6 (six) hours as needed for wheezing or shortness of breath. 02/18/16  Yes [name], MD  atenolol (TENORMIN) 50 MG tablet TAKE 1 TABLET BY MOUTH  EVERY DAY 11/20/15  Yes Alycia Rossetti, MD  azithromycin [facility]) 250 MG tablet 2 tabs poqday1, 1 tab poqday 2-5 05/20/16  Yes [name], MD  cefUROXime (CEFTIN) 500 MG tablet Take 1 tablet (500 mg total) by mouth 2 (two) times daily with a meal. 05/20/16  Yes [name], MD  Cholecalciferol 2000 units TABS Take 1 tablet (2,000 Units total) by mouth daily. Patient taking differently: Take 1 tablet by mouth once a week.  02/04/16  Yes [name], MD  clopidogrel (PLAVIX) 75 MG tablet Take 1 tablet (75 mg total) by mouth daily. 11/02/15  Yes Jettie Booze, MD  Cyanocobalamin (VITAMIN B 12 PO) Take 1 tablet by mouth daily.    Yes Historical Provider, MD  diclofenac sodium (VOLTAREN) 1 % GEL Apply 2 g topically daily as needed (for pain in knees and feet).   Yes Historical Provider, MD  FLUoxetine (PROZAC) 20 MG capsule TAKE ONE CAPSULE BY MOUTH DAILY 06/15/15  Yes [name], MD  HYDROcodone-acetaminophen (NORCO/VICODIN) 5-325 MG tablet Take 1 tablet by mouth as needed for pain. 07/31/15  Yes Historical Provider, MD  HYDROcodone-homatropine (HYCODAN) 5-1.5 MG/5ML syrup Take 5 mLs by mouth every 6 (six) hours as needed for cough. 05/20/16  Yes [name], MD  leflunomide (ARAVA) 10 MG tablet Take 10 mg by mouth daily.  08/01/15  Yes Historical Provider, MD  LORazepam (ATIVAN) 1 MG tablet TAKE ONE (1) TABLET BY MOUTH  3 TIMES DAILY (TAKE AT EIGHT IN THE MORNING NOONAND FOUR PM) 05/04/16  Yes [name], MD  nitroGLYCERIN (NITROSTAT) 0.4 MG SL tablet Place 1 tablet (0.4 mg total) under the tongue every 5 (five) minutes as needed for chest pain. 07/14/15  Yes Jettie Booze, MD  pantoprazole (PROTONIX) 40 MG tablet Take 1 tablet (40 mg total) by mouth daily. 11/02/15  Yes Jettie Booze, MD  predniSONE (DELTASONE) 20 MG tablet Take 2 tablets (40 mg total) by mouth daily with breakfast. 05/16/16  Yes [name], MD  rosuvastatin (CRESTOR) 5 MG tablet Take 1 tablet (5 mg total) by mouth daily. 11/02/15  Yes Jettie Booze, MD  Spacer/Aero-Holding Chambers (AEROCHAMBER PLUS WITH MASK) inhaler Use as instructed 10/19/15  Yes Charlesetta Shanks, MD  tamsulosin (FLOMAX) 0.4 MG CAPS capsule Take 0.4 mg by mouth daily after supper.   Yes Historical Provider, MD  traMADol (ULTRAM) 50 MG tablet Take 1 tablet by mouth every 12 (twelve) hours as needed.  07/15/10  Yes Historical Provider, MD  traZODone (DESYREL) 50 MG tablet Take 50 mg by mouth at bedtime as needed for sleep.   Yes Historical Provider, MD  Lifitegrast Shirley Friar) 5 % SOLN Apply 1 drop to eye 2 (two) times daily. Patient not taking: Reported on 05/21/2016 08/20/15   [name], MD     Review of systems complete and found to be negative unless listed above in HPI     Physical exam Blood pressure 116/69, pulse 84, temperature 97.7 F (36.5  C), temperature source Axillary, resp. rate 20, weight 157 lb (71.2 kg), SpO2 98 %. General: NAD Neck: No JVD, no thyromegaly or thyroid nodule.  Lungs: Diminished at right bases, +wheezes CV: Nondisplaced PMI. Regular rate and rhythm, normal S1/S2, no U1/L2, 2/6 systolic murmur over RUSB.  No peripheral edema.   Abdomen: Soft, nontender, no distention.  Skin: Intact without lesions or rashes.  Neurologic: Alert and oriented x 3.  Psych: Normal affect. Extremities: No clubbing or cyanosis.  HEENT:  Normal.   ECG: Most recent ECG reviewed.  Labs:   Lab Results  Component Value Date   WBC 2.6 (L) 05/21/2016   HGB 10.5 (L) 05/21/2016   HCT 31.9 (L) 05/21/2016   MCV 92.5 05/21/2016   PLT 56 (L) 05/21/2016    Recent Labs Lab 05/20/16 2345 05/21/16 0839  NA 134* 136  K 3.9 3.7  CL 103 107  CO2 18* 18*  BUN 25* 20  CREATININE 1.31* 1.14  CALCIUM 8.6* 7.5*  PROT 6.5  --   BILITOT 1.2  --   ALKPHOS 70  --   ALT 11*  --   AST 29  --   GLUCOSE 102* 226*   Lab Results  Component Value Date   CKTOTAL 67 06/16/2014   CKMB 1.0 06/16/2014   TROPONINI 0.10 (HH) 05/21/2016    Lab Results  Component Value Date   CHOL 98 05/05/2014   CHOL 97 09/09/2013   Lab Results  Component Value Date   HDL 33.00 (L) 05/05/2014   HDL 37 (L) 09/09/2013   Lab Results  Component Value Date   LDLCALC 48 05/05/2014   LDLCALC 42 09/09/2013   Lab Results  Component Value Date   TRIG 84.0 05/05/2014   TRIG 92 09/09/2013   Lab Results  Component Value Date   CHOLHDL 3 05/05/2014   No results found for: LDLDIRECT       Studies: Dg Chest 2 View  Result Date: 05/20/2016 CLINICAL DATA:  Cough and weakness. EXAM: CHEST  2 VIEW COMPARISON:  CT 04/05/2016.  Chest x-ray 12/30/2015 . FINDINGS: Prior CABG. Cardiomegaly with normal pulmonary vascularity. Persistent right base nodular infiltrate. Findings may be related to radiation. Persistent right base mass lesion. Similar findings noted on prior CT of 04/05/2016. Continued follow-up exams suggested. No acute bony abnormality. Surgical clips right upper quadrant. IMPRESSION: 1. Persistent nodular right lower lobe infiltrate. Findings may be related radiation. Persistent right base mass lesion. Similar findings noted on prior CT of 04/05/2016. Continued follow-up exam suggested. 2. Prior CABG.  Cardiomegaly. Electronically Signed   By: [place]   On: 05/20/2016 12:14   Ct Angio Chest Pe W And/or Wo Contrast  Result Date:  05/21/2016 CLINICAL DATA:  Fever, shortness of breath, positive D-dimer. History of lung cancer. EXAM: CT ANGIOGRAPHY CHEST WITH CONTRAST TECHNIQUE: Multidetector CT imaging of the chest was performed using the standard protocol during bolus administration of intravenous contrast. Multiplanar CT image reconstructions and MIPs were obtained to evaluate the vascular anatomy. CONTRAST:  100 mL Isovue 370 COMPARISON:  04/05/2016 FINDINGS: Cardiovascular: There is good opacification of the central and segmental pulmonary arteries. No focal filling defects demonstrated. No evidence of significant pulmonary embolus. Normal heart size. No pericardial effusions. Coronary artery calcifications. Reflux of contrast material into the hepatic veins suggest right heart failure. Normal caliber thoracic aorta with calcification. Mediastinum/Nodes: Postoperative changes in the mediastinum. Scattered calcified lymph nodes. No significant lymphadenopathy. Esophagus is decompressed. Lungs/Pleura: Right lower lung posterior mass lesions  seen previously is now obscured by surrounding consolidation. Patchy areas of infiltration in the right middle lung and right lower lung similar to prior study, likely radiation change. Basilar interstitial changes in the lungs new since prior study may represent edema. Evaluation is limited due to motion artifact. No pneumothorax. No pleural effusions. Airways are patent. Upper Abdomen: Surgical absence of the gallbladder. Vascular calcifications in the upper abdomen. Musculoskeletal: Degenerative changes in the spine. No destructive bone lesions. Review of the MIP images confirms the above findings. IMPRESSION: No evidence of significant pulmonary embolus. Consolidation in the right lower lung obscures visualization of known right lower lung mass. Patchy infiltration in the lungs consistent with radiation change and new interstitial edema. Electronically Signed   By: Lucienne Capers M.D.   On:  05/21/2016 02:52   Dg Chest Portable 1 View  Result Date: 05/21/2016 CLINICAL DATA:  Initial evaluation for increase shortness of breath. EXAM: PORTABLE CHEST 1 VIEW COMPARISON:  Prior radiograph from 05/20/2016 as well as CT from 04/05/2016. FINDINGS: Median sternotomy wires underlying surgical clips, stable. Mild cardiomegaly unchanged. Mediastinal silhouette within normal limits. Aortic atherosclerosis noted. Lungs are mildly hypoinflated. Persistent patchy and nodular opacity at the right lung base, similar to previous studies, and likely reflects post radiation changes and/or known mass within this region. Increased patchy density at the left lung base favored to largely reflect atelectasis/ bronchovascular crowding. No other focal airspace disease. No pulmonary edema. No significant pleural effusion on this single view of the chest. No pneumothorax. No acute osseous abnormality. IMPRESSION: 1. Persistent hazy and nodular density at the right lung base, most likely in large part reflecting post radiation changes and/or residual mass as seen on recent CT. Continued follow-up suggested. 2. Shallow lung inflation with increased left basilar opacity, most likely atelectasis/bronchovascular crowding. 3. Stable cardiomegaly with sequelae of prior CABG. No pulmonary edema. Electronically Signed   By: Jeannine Boga M.D.   On: 05/21/2016 00:20    ASSESSMENT AND PLAN:  1. New-onset atrial fibrillation: Currently back in sinus rhythm/sinus bradycardia, on atenolol 50 mg. CHADSVASC score is 4. Also on Plavix. Atrial fibrillation likely triggered by both pneumonia and influenza. Would like to avoid aggressive anticoagulation, especially in light of thrombocytopenia. For the time being, will just continue Plavix. Should he have recurrences of atrial fibrillation (certainly at risk given LA size and other comorbidities), may need to stop Plavix and start low-dose DOAC.  2. CAD with CABG and stent:  Symptomatically stable. Continue Plavix, Crestor, and atenolol.  3. Hypertension: Controlled. No changes.  4. Pneumonia/influenza A: Rx as per primary team.   Signed: Kate Sable, M.D., F.A.C.C.  05/21/2016, 3:45 PM

## 2016-05-21 NOTE — Progress Notes (Signed)
MD made aware of respiratory panel positive for influenza A.  Droplet precautions continued per protocol.

## 2016-05-21 NOTE — Progress Notes (Signed)
CRITICAL VALUE ALERT  Critical value received:  Troponin 0.06  Date of notification:  05/20/2016  Time of notification:  1460  Critical value read back:YES  Nurse who received alert:  Rosalita Chessman, RN  MD notified (1st page):  Carolin Sicks, D.   Time of first page:  0758  MD notified (2nd page):  Time of second page:  Responding MD:   Time MD responded:

## 2016-05-22 LAB — RENAL FUNCTION PANEL
Albumin: 2.6 g/dL — ABNORMAL LOW (ref 3.5–5.0)
Anion gap: 10 (ref 5–15)
BUN: 18 mg/dL (ref 6–20)
CO2: 20 mmol/L — ABNORMAL LOW (ref 22–32)
Calcium: 8.2 mg/dL — ABNORMAL LOW (ref 8.9–10.3)
Chloride: 108 mmol/L (ref 101–111)
Creatinine, Ser: 0.92 mg/dL (ref 0.61–1.24)
GFR calc Af Amer: >60 mL/min (ref >60)
GFR calc non Af Amer: >60 mL/min (ref >60)
Glucose, Bld: 134 mg/dL — ABNORMAL HIGH (ref 65–99)
Phosphorus: 3.1 mg/dL (ref 2.5–4.6)
Potassium: 4.2 mmol/L (ref 3.5–5.1)
Sodium: 138 mmol/L (ref 135–145)

## 2016-05-22 LAB — CBC
HCT: 33.9 % — ABNORMAL LOW (ref 39.0–52.0)
Hemoglobin: 11.2 g/dL — ABNORMAL LOW (ref 13.0–17.0)
MCH: 30.3 pg (ref 26.0–34.0)
MCHC: 33 g/dL (ref 30.0–36.0)
MCV: 91.6 fL (ref 78.0–100.0)
Platelets: 57 10*3/uL — ABNORMAL LOW (ref 150–400)
RBC: 3.7 MIL/uL — ABNORMAL LOW (ref 4.22–5.81)
RDW: 14 % (ref 11.5–15.5)
WBC: 3.9 10*3/uL — ABNORMAL LOW (ref 4.0–10.5)

## 2016-05-22 MED ORDER — WHITE PETROLATUM GEL
Status: AC
  Administered 2016-05-22: 17:00:00
  Filled 2016-05-22: qty 1

## 2016-05-22 MED ORDER — AZITHROMYCIN 500 MG PO TABS
500.0000 mg | ORAL_TABLET | Freq: Every day | ORAL | Status: DC
  Administered 2016-05-23: 500 mg via ORAL
  Filled 2016-05-22: qty 1

## 2016-05-22 NOTE — Progress Notes (Addendum)
PROGRESS NOTE    Evan [name]  JJO:[id number] DOB: [birth year] DOA: 05/20/2016 PCP: Odette Fraction, MD   Brief Narrative: 83 y.o. male with medical history significant for coronary artery disease status post CABG, anxiety, depression, GERD, rheumatoid arthritis, and non-small cell lung cancer involving the right lung, now presenting to the emergency department with fevers, chills, productive cough, and dyspnea. The patient was evaluated in the oncology clinic yesterday and noted to have a cough productive of thick yellow sputum. Chest x-ray was obtained on an outpatient basis, said to be suggestive of a right lower lobe infiltrate, and azithromycin and cefuroxime were called into the patient's pharmacy. Shortly after returning home, however, the patient developed high fevers and worsening in his dyspnea. In ED, patient is found to be febrile to 39.3 C, saturating adequately on room air, tachycardic in the 120s, tachypneic to 30, and with soft but stable blood pressure. EKG features in atrial fibrillation with rate 118 and LVH by voltage criteria with secondary repolarization abnormality. Chest x-ray is notable for a persistent nodular right lower lobe infiltrate. Assessment & Plan:  # Sepsis due to possible right lower lobe pneumonia and Influenza A positive -CT scan of chest consistent with right lower lobe consolidation which could be lung mass. - No hypoxia documented. Cultures has been negative. Continue ceftriaxone and azithromycin for now. -Continue Tamiflu, droplet precaution and supportive care for influenza treatment.  #New-onset atrial fibrillation likely in the setting of sepsis: -Now converted to sinus rhythm. Patient with sinus bradycardia today. I ordered EKG this morning and was reviewed personally. Cardiology is following. -Evaluated by cardiologist, pending echocardiogram. Continue telemetry monitoring. -Patient has mild elevation in troponin likely in the setting of  sepsis.  #Acute kidney injury: Serum creatinine level improved. Monitor BMP.  #Normal sided anemia, thrombocytopenia: Likely in the setting of malignancy, leflunomide. Continue to monitor CBC closely. No sign of bleeding.  #Acute metabolic encephalopathy: -Mentally status improved. No focal neurological deficit. Continue to monitor.  #History of coronary artery disease status post CABG: Continue cardiac medication including Plavix, atenolol, statin.  #Chronic diastolic congestive heart failure: I do not think patient has an acute exacerbation this time. Pending echocardiogram.  #Essential hypertension: Monitor blood pressure. Continue atenolol  #Right lung malignancy: Follows up with oncologist. Complicated chemotherapy and radiation therapy in December 2017 as per patient  #Rheumatoid arthritis: Continue home medication. Clinically stable.   Principal Problem:   Pneumonia Active Problems:   GERD (gastroesophageal reflux disease)   Hypertension   S/P CABG (coronary artery bypass graft)   Coronary atherosclerosis of native coronary artery   Essential hypertension   GAD (generalized anxiety disorder)   Adenocarcinoma of right lung (HCC)   Atrial fibrillation (HCC)  DVT prophylaxis: SCD. Patient has thrombocytopenia. Code Status: Full code Family Communication: Discussed with the patient's son at bedside. PT OT ordered Disposition Plan: Likely discharge home in 1-2 days.    Consultants:   Consulted cardiologist  Procedures: None Antimicrobials: Ceftriaxone and azithromycin.  Subjective: Patient was seen and examined at bedside. Patient was more alert awake and talking today. He reported feeling better. Denied nausea vomiting chest pain or shortness of breath. Son at bedside. Objective: Vitals:   05/21/16 2050 05/22/16 0530 05/22/16 0841 05/22/16 0940  BP: (!) 144/70 (!) 168/71 127/68   Pulse: 64 (!) 45 (!) 52 (!) 52  Resp: '20 18 20   '$ Temp: 97.8 F (36.6 C) 97 F (36.1  C) 98 F (36.7 C)   TempSrc: Oral Oral Oral  SpO2: 98% 98% 96%   Weight: 80.5 kg (177 lb 6.4 oz)       Intake/Output Summary (Last 24 hours) at 05/22/16 1104 Last data filed at 05/22/16 1017  Gross per 24 hour  Intake              900 ml  Output              775 ml  Net              125 ml   Filed Weights   05/21/16 0525 05/21/16 2050  Weight: 71.2 kg (157 lb) 80.5 kg (177 lb 6.4 oz)    Examination:  General exam: Alert awake and talking, not in distress Respiratory system: Clear bilaterally, respiratory effort normal  Cardiovascular system: Regular rhythm, bradycardia, S1 and S2 normal. Gastrointestinal system: Abdomen is nondistended, soft and nontender. Normal bowel sounds heard. Central nervous system: Alert and oriented. No focal neurological deficits. Extremities: Symmetric 5 x 5 power. Skin: No rashes, lesions or ulcers Psychiatry: Somnolent     Data Reviewed: I have personally reviewed following labs and imaging studies  CBC:  Recent Labs Lab 05/20/16 2345 05/21/16 1117 05/22/16 0432  WBC 5.0 2.6* 3.9*  NEUTROABS 2.2  --   --   HGB 12.4* 10.5* 11.2*  HCT 37.8* 31.9* 33.9*  MCV 92.6 92.5 91.6  PLT 58* 56* 57*   Basic Metabolic Panel:  Recent Labs Lab 05/20/16 2345 05/21/16 0839 05/22/16 0432  NA 134* 136 138  K 3.9 3.7 4.2  CL 103 107 108  CO2 18* 18* 20*  GLUCOSE 102* 226* 134*  BUN 25* 20 18  CREATININE 1.31* 1.14 0.92  CALCIUM 8.6* 7.5* 8.2*  PHOS  --  3.3 3.1   GFR: Estimated Creatinine Clearance: 55.8 mL/min (by C-G formula based on SCr of 0.92 mg/dL). Liver Function Tests:  Recent Labs Lab 05/20/16 2345 05/21/16 0839 05/22/16 0432  AST 29  --   --   ALT 11*  --   --   ALKPHOS 70  --   --   BILITOT 1.2  --   --   PROT 6.5  --   --   ALBUMIN 3.4* 2.6* 2.6*   No results for input(s): LIPASE, AMYLASE in the last 168 hours. No results for input(s): AMMONIA in the last 168 hours. Coagulation Profile:  Recent Labs Lab  05/20/16 2345  INR 1.08   Cardiac Enzymes:  Recent Labs Lab 05/21/16 0628 05/21/16 1117  TROPONINI 0.06* 0.10*   BNP (last 3 results) No results for input(s): PROBNP in the last 8760 hours. HbA1C: No results for input(s): HGBA1C in the last 72 hours. CBG: No results for input(s): GLUCAP in the last 168 hours. Lipid Profile: No results for input(s): CHOL, HDL, LDLCALC, TRIG, CHOLHDL, LDLDIRECT in the last 72 hours. Thyroid Function Tests: No results for input(s): TSH, T4TOTAL, FREET4, T3FREE, THYROIDAB in the last 72 hours. Anemia Panel: No results for input(s): VITAMINB12, FOLATE, FERRITIN, TIBC, IRON, RETICCTPCT in the last 72 hours. Sepsis Labs:  Recent Labs Lab 05/21/16 0005 05/21/16 0309 05/21/16 9622  LATICACIDVEN 2.34* 1.27 1.3    Recent Results (from the past 240 hour(s))  Culture, blood (Routine x 2)     Status: None (Preliminary result)   Collection Time: 05/20/16 11:35 PM  Result Value Ref Range Status   Specimen Description BLOOD LEFT FOREARM  Final   Special Requests BOTTLES DRAWN AEROBIC AND ANAEROBIC 5CC  Final   Culture NO  GROWTH 1 DAY  Final   Report Status PENDING  Incomplete  Culture, blood (Routine x 2)     Status: None (Preliminary result)   Collection Time: 05/20/16 11:59 PM  Result Value Ref Range Status   Specimen Description BLOOD RIGHT HAND  Final   Special Requests BOTTLES DRAWN AEROBIC AND ANAEROBIC 5CC  Final   Culture NO GROWTH 1 DAY  Final   Report Status PENDING  Incomplete  Respiratory Panel by PCR     Status: Abnormal   Collection Time: 05/21/16 12:16 AM  Result Value Ref Range Status   Adenovirus NOT DETECTED NOT DETECTED Final   Coronavirus 229E NOT DETECTED NOT DETECTED Final   Coronavirus HKU1 NOT DETECTED NOT DETECTED Final   Coronavirus NL63 NOT DETECTED NOT DETECTED Final   Coronavirus OC43 NOT DETECTED NOT DETECTED Final   Metapneumovirus NOT DETECTED NOT DETECTED Final   Rhinovirus / Enterovirus NOT DETECTED NOT  DETECTED Final   Influenza A H3 DETECTED (A) NOT DETECTED Final   Influenza B NOT DETECTED NOT DETECTED Final   Parainfluenza Virus 1 NOT DETECTED NOT DETECTED Final   Parainfluenza Virus 2 NOT DETECTED NOT DETECTED Final   Parainfluenza Virus 3 NOT DETECTED NOT DETECTED Final   Parainfluenza Virus 4 NOT DETECTED NOT DETECTED Final   Respiratory Syncytial Virus NOT DETECTED NOT DETECTED Final   Bordetella pertussis NOT DETECTED NOT DETECTED Final   Chlamydophila pneumoniae NOT DETECTED NOT DETECTED Final   Mycoplasma pneumoniae NOT DETECTED NOT DETECTED Final         Radiology Studies: Dg Chest 2 View  Result Date: 05/20/2016 CLINICAL DATA:  Cough and weakness. EXAM: CHEST  2 VIEW COMPARISON:  CT 04/05/2016.  Chest x-ray 12/30/2015 . FINDINGS: Prior CABG. Cardiomegaly with normal pulmonary vascularity. Persistent right base nodular infiltrate. Findings may be related to radiation. Persistent right base mass lesion. Similar findings noted on prior CT of 04/05/2016. Continued follow-up exams suggested. No acute bony abnormality. Surgical clips right upper quadrant. IMPRESSION: 1. Persistent nodular right lower lobe infiltrate. Findings may be related radiation. Persistent right base mass lesion. Similar findings noted on prior CT of 04/05/2016. Continued follow-up exam suggested. 2. Prior CABG.  Cardiomegaly. Electronically Signed   By: [place]   On: 05/20/2016 12:14   Ct Angio Chest Pe W And/or Wo Contrast  Result Date: 05/21/2016 CLINICAL DATA:  Fever, shortness of breath, positive D-dimer. History of lung cancer. EXAM: CT ANGIOGRAPHY CHEST WITH CONTRAST TECHNIQUE: Multidetector CT imaging of the chest was performed using the standard protocol during bolus administration of intravenous contrast. Multiplanar CT image reconstructions and MIPs were obtained to evaluate the vascular anatomy. CONTRAST:  100 mL Isovue 370 COMPARISON:  04/05/2016 FINDINGS: Cardiovascular: There is good  opacification of the central and segmental pulmonary arteries. No focal filling defects demonstrated. No evidence of significant pulmonary embolus. Normal heart size. No pericardial effusions. Coronary artery calcifications. Reflux of contrast material into the hepatic veins suggest right heart failure. Normal caliber thoracic aorta with calcification. Mediastinum/Nodes: Postoperative changes in the mediastinum. Scattered calcified lymph nodes. No significant lymphadenopathy. Esophagus is decompressed. Lungs/Pleura: Right lower lung posterior mass lesions seen previously is now obscured by surrounding consolidation. Patchy areas of infiltration in the right middle lung and right lower lung similar to prior study, likely radiation change. Basilar interstitial changes in the lungs new since prior study may represent edema. Evaluation is limited due to motion artifact. No pneumothorax. No pleural effusions. Airways are patent. Upper Abdomen: Surgical absence of  the gallbladder. Vascular calcifications in the upper abdomen. Musculoskeletal: Degenerative changes in the spine. No destructive bone lesions. Review of the MIP images confirms the above findings. IMPRESSION: No evidence of significant pulmonary embolus. Consolidation in the right lower lung obscures visualization of known right lower lung mass. Patchy infiltration in the lungs consistent with radiation change and new interstitial edema. Electronically Signed   By: Lucienne Capers M.D.   On: 05/21/2016 02:52   Dg Chest Portable 1 View  Result Date: 05/21/2016 CLINICAL DATA:  Initial evaluation for increase shortness of breath. EXAM: PORTABLE CHEST 1 VIEW COMPARISON:  Prior radiograph from 05/20/2016 as well as CT from 04/05/2016. FINDINGS: Median sternotomy wires underlying surgical clips, stable. Mild cardiomegaly unchanged. Mediastinal silhouette within normal limits. Aortic atherosclerosis noted. Lungs are mildly hypoinflated. Persistent patchy and  nodular opacity at the right lung base, similar to previous studies, and likely reflects post radiation changes and/or known mass within this region. Increased patchy density at the left lung base favored to largely reflect atelectasis/ bronchovascular crowding. No other focal airspace disease. No pulmonary edema. No significant pleural effusion on this single view of the chest. No pneumothorax. No acute osseous abnormality. IMPRESSION: 1. Persistent hazy and nodular density at the right lung base, most likely in large part reflecting post radiation changes and/or residual mass as seen on recent CT. Continued follow-up suggested. 2. Shallow lung inflation with increased left basilar opacity, most likely atelectasis/bronchovascular crowding. 3. Stable cardiomegaly with sequelae of prior CABG. No pulmonary edema. Electronically Signed   By: Jeannine Boga M.D.   On: 05/21/2016 00:20        Scheduled Meds: . atenolol  50 mg Oral Daily  . azithromycin  500 mg Intravenous Daily  . cefTRIAXone (ROCEPHIN)  IV  1 g Intravenous Daily  . clopidogrel  75 mg Oral Daily  . FLUoxetine  20 mg Oral Daily  . leflunomide  10 mg Oral Daily  . oseltamivir  30 mg Oral BID  . pantoprazole  40 mg Oral Daily  . predniSONE  40 mg Oral Q breakfast  . rosuvastatin  5 mg Oral Daily  . sodium chloride flush  3 mL Intravenous Q12H  . tamsulosin  0.4 mg Oral QPC supper   Continuous Infusions:   LOS: 1 day    Serenity Fortner Tanna Furry, MD [facility] Pager [phone]  If 7PM-7AM, please contact night-coverage [email or web address] Password [email or web address] 05/22/2016, 11:04 AM

## 2016-05-23 ENCOUNTER — Ambulatory Visit: Payer: Commercial Managed Care - HMO | Admitting: Family Medicine

## 2016-05-23 ENCOUNTER — Inpatient Hospital Stay (HOSPITAL_COMMUNITY): Payer: Medicare HMO

## 2016-05-23 DIAGNOSIS — J1100 Influenza due to unidentified influenza virus with unspecified type of pneumonia: Secondary | ICD-10-CM

## 2016-05-23 DIAGNOSIS — I480 Paroxysmal atrial fibrillation: Secondary | ICD-10-CM

## 2016-05-23 DIAGNOSIS — J189 Pneumonia, unspecified organism: Secondary | ICD-10-CM

## 2016-05-23 DIAGNOSIS — I4891 Unspecified atrial fibrillation: Secondary | ICD-10-CM

## 2016-05-23 DIAGNOSIS — I2510 Atherosclerotic heart disease of native coronary artery without angina pectoris: Secondary | ICD-10-CM

## 2016-05-23 DIAGNOSIS — J181 Lobar pneumonia, unspecified organism: Secondary | ICD-10-CM

## 2016-05-23 LAB — ECHOCARDIOGRAM COMPLETE
AO mean calculated velocity dopler: 109 cm/s
AV Area VTI index: 1.31 cm2/m2
AV Area VTI: 1.99 cm2
AV Area mean vel: 2.28 cm2
AV Mean grad: 6 mmHg
AV Peak grad: 10 mmHg
AV VEL mean LVOT/AV: 0.55
AV area mean vel ind: 1.19 cm2/m2
AV peak Index: 1.04
AV pk vel: 162 cm/s
AV vel: 2.51
Ao pk vel: 0.48 m/s
E decel time: 194 msec
E/e' ratio: 10.67
FS: 21 % — AB (ref 28–44)
Height: 70 in
IVS/LV PW RATIO, ED: 1.3
LA ID, A-P, ES: 51 mm
LA diam end sys: 51 mm
LA diam index: 2.66 cm/m2
LA vol A4C: 139 ml
LA vol index: 67.2 mL/m2
LA vol: 129 mL
LV E/e' medial: 10.67
LV E/e'average: 10.67
LV PW d: 12.1 mm — AB (ref 0.6–1.1)
LV e' LATERAL: 7.32 cm/s
LVOT MV VTI INDEX: 1.54 cm2/m2
LVOT SV: 85 mL
LVOT VTI: 20.6 cm
LVOT area: 4.15 cm2
LVOT diameter: 23 mm
LVOT peak VTI: 0.6 cm
LVOT peak vel: 77.6 cm/s
Lateral S' vel: 10.8 cm/s
MV Annulus VTI: 28.9 cm
MV Dec: 194
MV M vel: 41.6
MV Peak grad: 2 mmHg
MV pk A vel: 26.2 m/s
MV pk E vel: 78.1 m/s
Mean grad: 1 mmHg
PISA EROA: 0.08 cm2
RV sys press: 52 mmHg
Reg peak vel: 306 cm/s
TAPSE: 16.6 mm
TDI e' lateral: 7.32
TDI e' medial: 3.94
TR max vel: 306 cm/s
VTI: 199 cm
VTI: 34.1 cm
Valve area index: 1.31
Valve area: 2.51 cm2
Weight: 2643.2 oz

## 2016-05-23 LAB — CBC
HCT: 34 % — ABNORMAL LOW (ref 39.0–52.0)
Hemoglobin: 11.1 g/dL — ABNORMAL LOW (ref 13.0–17.0)
MCH: 30.1 pg (ref 26.0–34.0)
MCHC: 32.6 g/dL (ref 30.0–36.0)
MCV: 92.1 fL (ref 78.0–100.0)
Platelets: 54 10*3/uL — ABNORMAL LOW (ref 150–400)
RBC: 3.69 MIL/uL — ABNORMAL LOW (ref 4.22–5.81)
RDW: 14.1 % (ref 11.5–15.5)
WBC: 4.6 10*3/uL (ref 4.0–10.5)

## 2016-05-23 LAB — RENAL FUNCTION PANEL
Albumin: 2.6 g/dL — ABNORMAL LOW (ref 3.5–5.0)
Anion gap: 8 (ref 5–15)
BUN: 18 mg/dL (ref 6–20)
CO2: 21 mmol/L — ABNORMAL LOW (ref 22–32)
Calcium: 8.2 mg/dL — ABNORMAL LOW (ref 8.9–10.3)
Chloride: 108 mmol/L (ref 101–111)
Creatinine, Ser: 0.9 mg/dL (ref 0.61–1.24)
GFR calc Af Amer: >60 mL/min (ref >60)
GFR calc non Af Amer: >60 mL/min (ref >60)
Glucose, Bld: 104 mg/dL — ABNORMAL HIGH (ref 65–99)
Phosphorus: 2.4 mg/dL — ABNORMAL LOW (ref 2.5–4.6)
Potassium: 4 mmol/L (ref 3.5–5.1)
Sodium: 137 mmol/L (ref 135–145)

## 2016-05-23 MED ORDER — LEVOFLOXACIN 500 MG PO TABS: 500.0000 mg | ORAL_TABLET | Freq: Every day | ORAL | 0 refills | Status: AC

## 2016-05-23 MED ORDER — OSELTAMIVIR PHOSPHATE 30 MG PO CAPS: 30.0000 mg | ORAL_CAPSULE | Freq: Two times a day (BID) | ORAL | 0 refills | Status: AC

## 2016-05-23 NOTE — Evaluation (Signed)
Physical Therapy Evaluation Patient Details Name: Evan [name] MRN: [id number] DOB: [birth year] Today's Date: 05/23/2016   History of Present Illness  83 yo male with onset of flu and PNA, has been home alone and using no AD previously.  Son checks on him and available to assist him.  PMHx:  CABG, CAD, RA, lung CA,   Clinical Impression  Pt is walking with assistance, has notable weakness L knee that would benefit from PT at home to increase safety and balance.  His acute therapy will be for strengthening and gait training on walker, then to transition home for remaining deficits of mobility and strength of LLE.    Follow Up Recommendations Home health PT;Supervision for mobility/OOB    Equipment Recommendations  Rolling walker with 5" wheels (if current walker is not adequate and in good conditino)    Recommendations for Other Services       Precautions / Restrictions Precautions Precautions: Fall (O2 sats ck) Precaution Comments: may need more help at home as his illness progresses Restrictions Weight Bearing Restrictions: No      Mobility  Bed Mobility Overal bed mobility: Needs Assistance Bed Mobility: Supine to Sit;Sit to Supine     Supine to sit: Min assist Sit to supine: Min guard   General bed mobility comments: minor help under his trunk but has been in bed several days  Transfers Overall transfer level: Needs assistance Equipment used: Rolling walker (2 wheeled);1 person hand held assist Transfers: Sit to/from [facility] Sit to Stand: Min guard Stand pivot transfers: Min guard       General transfer comment: safety guarding of mobility  Ambulation/Gait Ambulation/Gait assistance: Min guard Ambulation Distance (Feet): 40 Feet (x 2) Assistive device: 1 person hand held assist (IV pole) Gait Pattern/deviations: Step-through pattern;Wide base of support;Decreased stride length;Trunk flexed Gait velocity: reduced Gait velocity  interpretation: Below normal speed for age/gender General Gait Details: has wide turns with mobility  Stairs            Wheelchair Mobility    Modified Rankin (Stroke Patients Only)       Balance Overall balance assessment: Needs assistance Sitting-balance support: Feet supported Sitting balance-[name] Scale: Good     Standing balance support: Single extremity supported Standing balance-[name] Scale: Fair                               Pertinent Vitals/Pain Pain Assessment: No/denies pain    Home Living Family/patient expects to be discharged to:: Private residence Living Arrangements: Alone Available Help at Discharge: Family;Available PRN/intermittently Type of Home: House Home Access: Ramped entrance     Home Layout: Two level;Able to live on main level with bedroom/bathroom Home Equipment: Gilford Rile - 2 wheels;Cane - single point;Grab bars - tub/shower Additional Comments: son is present who is involved with his care    Prior Function Level of Independence: Independent with assistive device(s)               Hand Dominance   Dominant Hand: Right    Extremity/Trunk Assessment   Upper Extremity Assessment Upper Extremity Assessment: Overall WFL for tasks assessed    Lower Extremity Assessment Lower Extremity Assessment: Overall WFL for tasks assessed (L knee ext 3+)    Cervical / Trunk Assessment Cervical / Trunk Assessment: Kyphotic  Communication   Communication: HOH  Cognition Arousal/Alertness: Awake/alert Behavior During Therapy: WFL for tasks assessed/performed Overall Cognitive Status: Within Functional Limits for  tasks assessed                      General Comments      Exercises     Assessment/Plan    PT Assessment Patient needs continued PT services  PT Problem List Decreased strength;Decreased range of motion;Decreased activity tolerance;Decreased balance;Decreased mobility;Decreased coordination;Decreased  knowledge of use of DME;Decreased safety awareness;Cardiopulmonary status limiting activity       PT Treatment Interventions DME instruction;Gait training;Functional mobility training;Therapeutic activities;Therapeutic exercise;Balance training;Neuromuscular re-education;Patient/family education    PT Goals (Current goals can be found in the Care Plan section)  Acute Rehab PT Goals Patient Stated Goal: to get home soon PT Goal Formulation: With patient/family Time For Goal Achievement: 06/06/16 Potential to Achieve Goals: Good    Frequency Min 3X/week   Barriers to discharge Decreased caregiver support home alone at times    Co-evaluation               End of Session Equipment Utilized During Treatment: Gait belt Activity Tolerance: Patient tolerated treatment well;Treatment limited secondary to medical complications (Comment) (O2 sats were stable for gait of 96-97% but pulse up to 111 f) Patient left: in bed;with call bell/phone within reach;with family/visitor present Nurse Communication: Mobility status PT Visit Diagnosis: Unsteadiness on feet (R26.81);Muscle weakness (generalized) (M62.81)         Time: 4098-1191 PT Time Calculation (min) (ACUTE ONLY): 22 min   Charges:   PT Evaluation $PT Eval Moderate Complexity: 1 Procedure     PT G Codes:         Ramond Dial 2016/06/17, 11:28 AM   Mee Hives, PT MS Acute Rehab Dept. Number: [place]

## 2016-05-23 NOTE — Progress Notes (Signed)
Progress Note  Patient Name: Evan [name] Date of Encounter: 05/23/2016  Primary Cardiologist: [place]   Getting ready for DC. Feels well. No recurrence of AFib on monitor. As per Dr. Court Joy note, plan to continue clopidogrel for now, avoid added bleeding risk associated with anticoagulant. Echo shows normal LV function and minor valvular abnormalities, but also moderate to severe left atrial dilation. Will make sure he has a Cardiology follow up scheduled.  Inpatient Medications    Scheduled Meds: . atenolol  50 mg Oral Daily  . azithromycin  500 mg Oral Daily  . cefTRIAXone (ROCEPHIN)  IV  1 g Intravenous Daily  . clopidogrel  75 mg Oral Daily  . FLUoxetine  20 mg Oral Daily  . leflunomide  10 mg Oral Daily  . oseltamivir  30 mg Oral BID  . pantoprazole  40 mg Oral Daily  . predniSONE  40 mg Oral Q breakfast  . rosuvastatin  5 mg Oral Daily  . sodium chloride flush  3 mL Intravenous Q12H  . tamsulosin  0.4 mg Oral QPC supper   Continuous Infusions:  PRN Meds: sodium chloride, acetaminophen, albuterol, diclofenac sodium, HYDROcodone-acetaminophen, LORazepam, sodium chloride flush, traZODone   Vital Signs    Vitals:   05/23/16 0537 05/23/16 0900 05/23/16 1200 05/23/16 1433  BP: (!) 175/75 140/66  (!) 147/72  Pulse: (!) 47 61  (!) 57  Resp: '18 18  18  '$ Temp: 98.4 F (36.9 C) 98 F (36.7 C)  98.7 F (37.1 C)  TempSrc: Oral Oral  Oral  SpO2: 98% 98%  95%  Weight:      Height:   '5\' 10"'$  (1.778 m)     Intake/Output Summary (Last 24 hours) at 05/23/16 1455 Last data filed at 05/23/16 1325  Gross per 24 hour  Intake              880 ml  Output             2050 ml  Net            -1170 ml   Filed Weights   05/21/16 0525 05/21/16 2050 05/22/16 2143  Weight: 71.2 kg (157 lb) 80.5 kg (177 lb 6.4 oz) 74.9 kg (165 lb 3.2 oz)    Telemetry    NSR - Personally Reviewed  Physical Exam  Very hard of hearing GEN: No acute distress.   Neck: No  JVD Cardiac: RRR, early peaking aortic ejection murmur 2/6, no diastolic murmurs, rubs, or gallops.  Respiratory: Diminished breath sounds, but clear to auscultation bilaterally. GI: Soft, nontender, non-distended  MS: No edema; prominent digital arthrosis changes. Neuro:  Nonfocal  Psych: Normal affect   Labs    Chemistry Recent Labs Lab 05/20/16 2345 05/21/16 0839 05/22/16 0432 05/23/16 0416  NA 134* 136 138 137  K 3.9 3.7 4.2 4.0  CL 103 107 108 108  CO2 18* 18* 20* 21*  GLUCOSE 102* 226* 134* 104*  BUN 25* '20 18 18  '$ CREATININE 1.31* 1.14 0.92 0.90  CALCIUM 8.6* 7.5* 8.2* 8.2*  PROT 6.5  --   --   --   ALBUMIN 3.4* 2.6* 2.6* 2.6*  AST 29  --   --   --   ALT 11*  --   --   --   ALKPHOS 70  --   --   --   BILITOT 1.2  --   --   --   GFRNONAA 48* 56* >60 >60  GFRAA 55* >60 >60 >60  ANIONGAP '13 11 10 8     '$ Hematology Recent Labs Lab 05/21/16 1117 05/22/16 0432 05/23/16 0416  WBC 2.6* 3.9* 4.6  RBC 3.45* 3.70* 3.69*  HGB 10.5* 11.2* 11.1*  HCT 31.9* 33.9* 34.0*  MCV 92.5 91.6 92.1  MCH 30.4 30.3 30.1  MCHC 32.9 33.0 32.6  RDW 14.2 14.0 14.1  PLT 56* 57* 54*    Cardiac Enzymes Recent Labs Lab 05/21/16 0628 05/21/16 1117  TROPONINI 0.06* 0.10*    Recent Labs Lab 05/21/16 0002  TROPIPOC 0.06     BNP Recent Labs Lab 05/20/16 2345  BNP 585.3*     DDimer  Recent Labs Lab 05/20/16 2345  DDIMER 0.94*     Cardiac Studies   Echo 05/23/2016 - Left ventricle: The cavity size was normal. There was moderate   concentric hypertrophy. Systolic function was normal. The   estimated ejection fraction was in the range of 60% to 65%. Left   ventricular diastolic function parameters were normal. - Aortic valve: There was mild stenosis. Valve area (VTI): 2.51   cm^2. Valve area (Vmax): 1.99 cm^2. Valve area (Vmean): 2.28   cm^2. - Mitral valve: There was mild regurgitation. - Left atrium: The appendage was moderately to severely dilated. - Atrial  septum: No defect or patent foramen ovale was identified. - Pulmonary arteries: PA peak pressure: 52 mm Hg (S).  Patient Profile     83 y.o. male with first ever documented episode of brief paroxysmal atrial fibrillation, in the setting of influenza A and RLL pneumonia. Numerous comorbid conditions include CAD s/p CABG and PCI, lung Ca s/p XRT (completed Nov 2017), moderate chronic thrombocytopenia, HTN.  Assessment & Plan    1. AFib: brief episode may have been precipitated by respiratory infection and self terminated. On the other hand, he has substantial LA enlargement. Bleeding risk is higher than average. Overall longevity limited by comorbidity. Risk of bleeding may outweigh embolic risk (even though CHADSVasc is 4). Keep on clopidogrel only for now. 2. CAD s/p CABG and PCI:  Angina free. Negative stress test in June 2017. 3. HLP: excellent LDL on statin. 4. HTN: note substantial problems with orthostatic hypotension in the past. Avoid "perfect" BP control.   Signed, Sanda Klein, MD  05/23/2016, 2:55 PM

## 2016-05-23 NOTE — Progress Notes (Signed)
  Echocardiogram 2D Echocardiogram has been performed.  Evan [name] 05/23/2016, 2:13 PM

## 2016-05-23 NOTE — Evaluation (Signed)
Occupational Therapy Evaluation Patient Details Name: Evan [name] MRN: [id number] DOB: [birth year] Today's Date: 05/23/2016    History of Present Illness 83 yo male with onset of flu and PNA, has been home alone and using no AD previously.  Son checks on him and available to assist him.  PMHx:  CABG, CAD, RA, lung CA,    Clinical Impression   PTA, pt lived alone and was independent in ADLs with an aide for IADLs.  Pt currently performs ADLs and functional mobility with Min guard and Rw. VSS throughout session on room air. Will follow acutely to increased independence and facilitate safe d/c home. Recommend HHOT and 24/7 supervision upon d/c to increase independence and safety.     Follow Up Recommendations  Home health OT;Supervision/Assistance - 24 hour    Equipment Recommendations  3 in 1 bedside commode    Recommendations for Other Services       Precautions / Restrictions Precautions Precautions: Fall Precaution Comments: may need more help at home as his illness progresses Restrictions Weight Bearing Restrictions: No      Mobility Bed Mobility Overal bed mobility: Needs Assistance Bed Mobility: Supine to Sit     Supine to sit: Supervision;HOB elevated Sit to supine: Min guard   General bed mobility comments:   Transfers Overall transfer level: Needs assistance Equipment used: Rolling walker (2 wheeled) Transfers: Sit to/from Stand Sit to Stand: Min guard Stand pivot transfers: Min guard       General transfer comment: safety guarding of mobility    Balance Overall balance assessment: Needs assistance Sitting-balance support: Feet supported Sitting balance-[name] Scale: Good     Standing balance support: Bilateral upper extremity supported;During functional activity Standing balance-[name] Scale: Fair                              ADL Overall ADL's : Needs assistance/impaired Eating/Feeding: Set up   Grooming: Set up;Standing   Upper  Body Bathing: Set up;Sitting   Lower Body Bathing: Sit to/from stand;Min guard   Upper Body Dressing : Set up;Sitting   Lower Body Dressing: Min guard;Sit to/from stand   Toilet Transfer: Min guard;Ambulation;Regular Toilet;RW   Toileting- Water quality scientist and Hygiene: Min guard;Sit to/from stand       Functional mobility during ADLs: Min guard;Rolling walker General ADL Comments: Pt performs ADLs and functional mobility with Min guard for safety; VSS throughout on room air     Vision         Perception     Praxis      Pertinent Vitals/Pain Pain Assessment: No/denies pain     Hand Dominance Right   Extremity/Trunk Assessment Upper Extremity Assessment Upper Extremity Assessment: Generalized weakness   Lower Extremity Assessment Lower Extremity Assessment: Overall WFL for tasks assessed;Defer to PT evaluation   Cervical / Trunk Assessment Cervical / Trunk Assessment: Kyphotic   Communication Communication Communication: HOH   Cognition Arousal/Alertness: Awake/alert Behavior During Therapy: WFL for tasks assessed/performed Overall Cognitive Status: Within Functional Limits for tasks assessed                     General Comments       Exercises      Shoulder Instructions      Home Living Family/patient expects to be discharged to:: Private residence Living Arrangements: Alone Available Help at Discharge: Family;Available PRN/intermittently (Son reports that someone will be home near 24/7 at dc) Type of Home:  House Home Access: Ramped entrance     Home Layout: Two level;Able to live on main level with bedroom/bathroom;Other (Comment) (Main floor and basement)     Bathroom Shower/Tub: Occupational psychologist: Standard Bathroom Accessibility: Yes   Home Equipment: Walker - 2 wheels;Cane - single point;Grab bars - tub/shower;Shower seat   Additional Comments: [place] aide that comes in 3 times a week to assist with IADLs       Prior Functioning/Environment Level of Independence: Needs assistance    ADL's / Homemaking Assistance Needed: Children and aide provided A for IADLs            OT Problem List: Decreased strength;Decreased activity tolerance;Impaired balance (sitting and/or standing);Decreased safety awareness;Decreased knowledge of use of DME or AE      OT Treatment/Interventions: Self-care/ADL training;Therapeutic exercise;DME and/or AE instruction;Therapeutic activities;Patient/family education    OT Goals(Current goals can be found in the care plan section) Acute Rehab OT Goals Patient Stated Goal: to get home soon OT Goal Formulation: With patient Time For Goal Achievement: 06/06/16 Potential to Achieve Goals: Good ADL Goals Pt Will Perform Lower Body Bathing: with modified independence;sit to/from stand Pt Will Transfer to Toilet: with modified independence;ambulating;regular height toilet Additional ADL Goal #1: Pt will independently verbalized three fall prevention strateigies  OT Frequency: Min 2X/week   Barriers to D/C:            Co-evaluation              End of Session Equipment Utilized During Treatment: Gait belt;Rolling walker Nurse Communication: Mobility status  Activity Tolerance: Patient tolerated treatment well Patient left: in chair;with call bell/phone within reach;with family/visitor present  OT Visit Diagnosis: Muscle weakness (generalized) (M62.81)                ADL either performed or assessed with clinical judgement  Time: 2241-1464 OT Time Calculation (min): 28 min Charges:  OT General Charges $OT Visit: 1 Procedure OT Evaluation $OT Eval Moderate Complexity: 1 Procedure OT Treatments $Self Care/Home Management : 8-22 mins G-Codes:     [employer], OTR/L [phone]  Heywood Footman Isabeau Mccalla 05/23/2016, 1:21 PM

## 2016-05-23 NOTE — Discharge Summary (Signed)
Physician Discharge Summary  83 y.o.male DOB: 07-07-1927 DOA: 05/20/2016 DOB: 07-07-1927 DOA: 05/20/2016  PCP: Odette Fraction, MD  Admit date: 05/20/2016 Discharge date: 05/23/2016  Admitted From:home Disposition:home with home care.  Recommendations for Outpatient Follow-up:  1. Follow up with PCP in 1-2 weeks 2. Please obtain BMP/CBC in one week  Home Health: Yes Equipment/Devices: Wheelchair Discharge Condition:stable CODE STATUS:full Diet recommendation: heart healthy  Brief/Interim Summary: 83 y.o.malewith medical history significant forcoronary artery disease status post CABG, anxiety, depression, GERD, rheumatoid arthritis, and non-small cell lung cancer involving the right lung, now presenting to the emergency department with fevers, chills, productive cough, and dyspnea. The patient was evaluated in the oncology clinic yesterday and noted to have a cough productive of thick yellow sputum. Chest x-ray was obtained on an outpatient basis, said to be suggestive of a right lower lobe infiltrate, and azithromycin and cefuroxime were called into the patient's pharmacy. Shortly after returning home, however, the patient developed high fevers and worsening in his dyspnea. In ED, patient is found to be febrile to 39.3 C, saturating adequately on room air, tachycardic in the 120s, tachypneic to 30, and with soft but stable blood pressure. EKG features in atrial fibrillation with rate 118 and LVH by voltage criteria with secondary repolarization abnormality. Chest x-ray is notable for a persistent nodular right lower lobe infiltrate.  # Sepsis due to possible right lower lobe pneumonia and Influenza A positive -CT scan of chest consistent with right lower lobe consolidation which could be lung mass. Advised to follow up with his oncologist. - No hypoxia documented. Cultures has been negative.  -Patient was treated with ceftriaxone, azithromycin and Tamiflu. He clinically improved. Discharging with oral  Levaquin and Tamiflu to complete the course.  # New-onset atrial fibrillation likely in the setting of sepsis: -Now converted to sinus rhythm. Patient with intermittent bradycardia. Evaluated by cardiologist recommended continue current medical management. Echocardiogram was done which showed EF of 60-65%. Patient's heart rate and blood pressure acceptable. Recommended to monitor heart rate and blood pressure home. -Patient has mild elevation in troponin likely in the setting of sepsis.  #Acute kidney injury: Serum creatinine level improved. Monitor BMP.  #Normocytic anemia, thrombocytopenia: Likely in the setting of malignancy, leflunomide. Continue to monitor CBC closely. No sign of bleeding.  #Acute metabolic encephalopathy: -Mentally status improved. No focal neurological deficit.   #History of coronary artery disease status post CABG: Continue cardiac medication including Plavix, atenolol, statin.  #Chronic diastolic congestive heart failure: I do not think patient has an acute exacerbation this time.   #Essential hypertension: Monitor blood pressure. Continue atenolol  #Right lung malignancy: Follows up with oncologist. Complicated chemotherapy and radiation therapy in December 2017 as per patient. I discussed with the patient's son regarding the prednisone. He reported that the oncologist gave him a course of prednisone which the patient already completed.  #Rheumatoid arthritis: Continue home medication. Clinically stable.  Patient is clinically improved. Evaluated by PT, OT recommended home care. Patient denied headache, dizziness, chest pain, shortness of breath, nausea or vomiting. Echocardiogram with no significant change. Patient is discharging home with home care, Levaquin and Tamiflu. I discussed with the patient and his son at bedside in detail. Recommended to follow-up with PCP and oncologist as an outpatient.   Discharge Diagnoses:  Principal Problem:    Influenza with pneumonia Active Problems:   GERD (gastroesophageal reflux disease)   Hypertension   S/P CABG (coronary artery bypass graft)   Coronary atherosclerosis of native coronary artery   Essential  hypertension   GAD (generalized anxiety disorder)   Adenocarcinoma of right lung (HCC)   Atrial fibrillation [facility])    Discharge Instructions  Discharge Instructions    Call MD for:  difficulty breathing, headache or visual disturbances    Complete by:  As directed    Call MD for:  extreme fatigue    Complete by:  As directed    Call MD for:  hives    Complete by:  As directed    Call MD for:  persistant dizziness or light-headedness    Complete by:  As directed    Call MD for:  persistant nausea and vomiting    Complete by:  As directed    Call MD for:  severe uncontrolled pain    Complete by:  As directed    Call MD for:  temperature >100.4    Complete by:  As directed    Diet - low sodium heart healthy    Complete by:  As directed    Discharge instructions    Complete by:  As directed    Please follow up with your PCP and oncologist.   Increase activity slowly    Complete by:  As directed      Allergies as of 05/23/2016      Reactions   Feldene [piroxicam] Other (See Comments)   REACTION: Blisters   Statins Other (See Comments)   Myalgias   Doxycycline Other (See Comments)   REACTION:  unknown   Latex Rash   Tequin Anxiety   Valium Other (See Comments)   REACTION:  unknown      Medication List    STOP taking these medications   azithromycin 250 MG tablet Commonly known as:  ZITHROMAX   cefUROXime 500 MG tablet Commonly known as:  CEFTIN   HYDROcodone-homatropine 5-1.5 MG/5ML syrup Commonly known as:  HYCODAN   Lifitegrast 5 % Soln Commonly known as:  XIIDRA   predniSONE 20 MG tablet Commonly known as:  DELTASONE     TAKE these medications   aerochamber plus with mask inhaler Use as instructed   albuterol 108 (90 Base) MCG/ACT  inhaler Commonly known as:  VENTOLIN HFA Inhale 1-2 puffs into the lungs every 6 (six) hours as needed for wheezing or shortness of breath.   atenolol 50 MG tablet Commonly known as:  TENORMIN TAKE 1 TABLET BY MOUTH  EVERY DAY   Cholecalciferol 2000 units Tabs Take 1 tablet (2,000 Units total) by mouth daily. What changed:  when to take this   clopidogrel 75 MG tablet Commonly known as:  PLAVIX Take 1 tablet (75 mg total) by mouth daily.   diclofenac sodium 1 % Gel Commonly known as:  VOLTAREN Apply 2 g topically daily as needed (for pain in knees and feet).   FLUoxetine 20 MG capsule Commonly known as:  PROZAC TAKE ONE CAPSULE BY MOUTH DAILY   HYDROcodone-acetaminophen 5-325 MG tablet Commonly known as:  NORCO/VICODIN Take 1 tablet by mouth as needed for pain.   leflunomide 10 MG tablet Commonly known as:  ARAVA Take 10 mg by mouth daily.   levofloxacin 500 MG tablet Commonly known as:  LEVAQUIN Take 1 tablet (500 mg total) by mouth daily.   LORazepam 1 MG tablet Commonly known as:  ATIVAN TAKE ONE (1) TABLET BY MOUTH 3 TIMES DAILY (TAKE AT EIGHT IN THE MORNING NOONAND FOUR PM)   nitroGLYCERIN 0.4 MG SL tablet Commonly known as:  NITROSTAT Place 1 tablet (0.4 mg total) under  the tongue every 5 (five) minutes as needed for chest pain.   oseltamivir 30 MG capsule Commonly known as:  TAMIFLU Take 1 capsule (30 mg total) by mouth 2 (two) times daily.   pantoprazole 40 MG tablet Commonly known as:  PROTONIX Take 1 tablet (40 mg total) by mouth daily.   rosuvastatin 5 MG tablet Commonly known as:  CRESTOR Take 1 tablet (5 mg total) by mouth daily.   tamsulosin 0.4 MG Caps capsule Commonly known as:  FLOMAX Take 0.4 mg by mouth daily after supper.   traMADol 50 MG tablet Commonly known as:  ULTRAM Take 1 tablet by mouth every 12 (twelve) hours as needed.   traZODone 50 MG tablet Commonly known as:  DESYREL Take 50 mg by mouth at bedtime as needed for  sleep.   VITAMIN B 12 PO Take 1 tablet by mouth daily.      Follow-up Information    PICKARD,WARREN TOM, MD. Schedule an appointment as soon as possible for a visit in 1 week(s).   Specialty:  Family Medicine Contact information: [address] [place] [address] [phone]        Kate Sable, MD. Schedule an appointment as soon as possible for a visit in 2 week(s).   Specialty:  Cardiology Contact information: [place] [address] [phone]          Allergies  Allergen Reactions  . Feldene [Piroxicam] Other (See Comments)    REACTION: Blisters  . Statins Other (See Comments)    Myalgias  . Doxycycline Other (See Comments)    REACTION:  unknown  . Latex Rash  . Tequin Anxiety  . Valium Other (See Comments)    REACTION:  unknown    Consultations: Cardiologist  Procedures/Studies: Echocardiogram  Subjective: Patient was seen and examined at bedside. He was alert awake and oriented. His son at bedside. Patient reported feeling better. Denied headache, dizziness, nausea, vomiting, chest pain or shortness of breath. Denied dysuria urgency or diarrhea.  Discharge Exam: Vitals:   05/23/16 0900 05/23/16 1433  BP: 140/66 (!) 147/72  Pulse: 61 (!) 57  Resp: 18 18  Temp: 98 F (36.7 C) 98.7 F (37.1 C)   Vitals:   05/23/16 0537 05/23/16 0900 05/23/16 1200 05/23/16 1433  BP: (!) 175/75 140/66  (!) 147/72  Pulse: (!) 47 61  (!) 57  Resp: '18 18  18  '$ Temp: 98.4 F (36.9 C) 98 F (36.7 C)  98.7 F (37.1 C)  TempSrc: Oral Oral  Oral  SpO2: 98% 98%  95%  Weight:      Height:   '5\' 10"'$  (1.778 m)     General: Pt is alert, awake, not in acute distress Cardiovascular: RRR, S1/S2 +, no rubs, no gallops Respiratory: CTA bilaterally, no wheezing, no rhonchi Abdominal: Soft, NT, ND, bowel sounds + Extremities: no edema, no cyanosis Neurology: Alert, awake, oriented, no focal neurologic exam.   The results of  significant diagnostics from this hospitalization (including imaging, microbiology, ancillary and laboratory) are listed below for reference.     Microbiology: Recent Results (from the past 240 hour(s))  Culture, blood (Routine x 2)     Status: None (Preliminary result)   Collection Time: 05/20/16 11:35 PM  Result Value Ref Range Status   Specimen Description BLOOD LEFT FOREARM  Final   Special Requests BOTTLES DRAWN AEROBIC AND ANAEROBIC 5CC  Final   Culture NO GROWTH 2 DAYS  Final  Report Status PENDING  Incomplete  Culture, blood (Routine x 2)     Status: None (Preliminary result)   Collection Time: 05/20/16 11:59 PM  Result Value Ref Range Status   Specimen Description BLOOD RIGHT HAND  Final   Special Requests BOTTLES DRAWN AEROBIC AND ANAEROBIC 5CC  Final   Culture NO GROWTH 2 DAYS  Final   Report Status PENDING  Incomplete  Respiratory Panel by PCR     Status: Abnormal   Collection Time: 05/21/16 12:16 AM  Result Value Ref Range Status   Adenovirus NOT DETECTED NOT DETECTED Final   Coronavirus 229E NOT DETECTED NOT DETECTED Final   Coronavirus HKU1 NOT DETECTED NOT DETECTED Final   Coronavirus NL63 NOT DETECTED NOT DETECTED Final   Coronavirus OC43 NOT DETECTED NOT DETECTED Final   Metapneumovirus NOT DETECTED NOT DETECTED Final   Rhinovirus / Enterovirus NOT DETECTED NOT DETECTED Final   Influenza A H3 DETECTED (A) NOT DETECTED Final   Influenza B NOT DETECTED NOT DETECTED Final   Parainfluenza Virus 1 NOT DETECTED NOT DETECTED Final   Parainfluenza Virus 2 NOT DETECTED NOT DETECTED Final   Parainfluenza Virus 3 NOT DETECTED NOT DETECTED Final   Parainfluenza Virus 4 NOT DETECTED NOT DETECTED Final   Respiratory Syncytial Virus NOT DETECTED NOT DETECTED Final   Bordetella pertussis NOT DETECTED NOT DETECTED Final   Chlamydophila pneumoniae NOT DETECTED NOT DETECTED Final   Mycoplasma pneumoniae NOT DETECTED NOT DETECTED Final     Labs: BNP (last 3 results)  Recent  Labs  05/20/16 2345  BNP 614.4*   Basic Metabolic Panel:  Recent Labs Lab 05/20/16 2345 05/21/16 0839 05/22/16 0432 05/23/16 0416  NA 134* 136 138 137  K 3.9 3.7 4.2 4.0  CL 103 107 108 108  CO2 18* 18* 20* 21*  GLUCOSE 102* 226* 134* 104*  BUN 25* '20 18 18  '$ CREATININE 1.31* 1.14 0.92 0.90  CALCIUM 8.6* 7.5* 8.2* 8.2*  PHOS  --  3.3 3.1 2.4*   Liver Function Tests:  Recent Labs Lab 05/20/16 2345 05/21/16 0839 05/22/16 0432 05/23/16 0416  AST 29  --   --   --   ALT 11*  --   --   --   ALKPHOS 70  --   --   --   BILITOT 1.2  --   --   --   PROT 6.5  --   --   --   ALBUMIN 3.4* 2.6* 2.6* 2.6*   No results for input(s): LIPASE, AMYLASE in the last 168 hours. No results for input(s): AMMONIA in the last 168 hours. CBC:  Recent Labs Lab 05/20/16 2345 05/21/16 1117 05/22/16 0432 05/23/16 0416  WBC 5.0 2.6* 3.9* 4.6  NEUTROABS 2.2  --   --   --   HGB 12.4* 10.5* 11.2* 11.1*  HCT 37.8* 31.9* 33.9* 34.0*  MCV 92.6 92.5 91.6 92.1  PLT 58* 56* 57* 54*   Cardiac Enzymes:  Recent Labs Lab 05/21/16 0628 05/21/16 1117  TROPONINI 0.06* 0.10*   BNP: Invalid input(s): POCBNP CBG: No results for input(s): GLUCAP in the last 168 hours. D-Dimer  Recent Labs  05/20/16 2345  DDIMER 0.94*   Hgb A1c No results for input(s): HGBA1C in the last 72 hours. Lipid Profile No results for input(s): CHOL, HDL, LDLCALC, TRIG, CHOLHDL, LDLDIRECT in the last 72 hours. Thyroid function studies No results for input(s): TSH, T4TOTAL, T3FREE, THYROIDAB in the last 72 hours.  Invalid input(s): FREET3 Anemia work  up No results for input(s): VITAMINB12, FOLATE, FERRITIN, TIBC, IRON, RETICCTPCT in the last 72 hours. Urinalysis    Component Value Date/Time   COLORURINE YELLOW 05/21/2016 0016   APPEARANCEUR CLEAR 05/21/2016 0016   LABSPEC 1.019 05/21/2016 0016   PHURINE 5.0 05/21/2016 0016   GLUCOSEU NEGATIVE 05/21/2016 0016   HGBUR NEGATIVE 05/21/2016 0016   BILIRUBINUR  NEGATIVE 05/21/2016 0016   KETONESUR NEGATIVE 05/21/2016 0016   PROTEINUR 30 (A) 05/21/2016 0016   UROBILINOGEN 0.2 07/13/2014 1804   NITRITE NEGATIVE 05/21/2016 0016   LEUKOCYTESUR NEGATIVE 05/21/2016 0016   Sepsis Labs Invalid input(s): PROCALCITONIN,  WBC,  LACTICIDVEN Microbiology Recent Results (from the past 240 hour(s))  Culture, blood (Routine x 2)     Status: None (Preliminary result)   Collection Time: 05/20/16 11:35 PM  Result Value Ref Range Status   Specimen Description BLOOD LEFT FOREARM  Final   Special Requests BOTTLES DRAWN AEROBIC AND ANAEROBIC 5CC  Final   Culture NO GROWTH 2 DAYS  Final   Report Status PENDING  Incomplete  Culture, blood (Routine x 2)     Status: None (Preliminary result)   Collection Time: 05/20/16 11:59 PM  Result Value Ref Range Status   Specimen Description BLOOD RIGHT HAND  Final   Special Requests BOTTLES DRAWN AEROBIC AND ANAEROBIC 5CC  Final   Culture NO GROWTH 2 DAYS  Final   Report Status PENDING  Incomplete  Respiratory Panel by PCR     Status: Abnormal   Collection Time: 05/21/16 12:16 AM  Result Value Ref Range Status   Adenovirus NOT DETECTED NOT DETECTED Final   Coronavirus 229E NOT DETECTED NOT DETECTED Final   Coronavirus HKU1 NOT DETECTED NOT DETECTED Final   Coronavirus NL63 NOT DETECTED NOT DETECTED Final   Coronavirus OC43 NOT DETECTED NOT DETECTED Final   Metapneumovirus NOT DETECTED NOT DETECTED Final   Rhinovirus / Enterovirus NOT DETECTED NOT DETECTED Final   Influenza A H3 DETECTED (A) NOT DETECTED Final   Influenza B NOT DETECTED NOT DETECTED Final   Parainfluenza Virus 1 NOT DETECTED NOT DETECTED Final   Parainfluenza Virus 2 NOT DETECTED NOT DETECTED Final   Parainfluenza Virus 3 NOT DETECTED NOT DETECTED Final   Parainfluenza Virus 4 NOT DETECTED NOT DETECTED Final   Respiratory Syncytial Virus NOT DETECTED NOT DETECTED Final   Bordetella pertussis NOT DETECTED NOT DETECTED Final   Chlamydophila pneumoniae  NOT DETECTED NOT DETECTED Final   Mycoplasma pneumoniae NOT DETECTED NOT DETECTED Final     Time coordinating discharge: 32 minutes  SIGNED:   Rosita Fire, MD  [facility] 05/23/2016, 2:44 PM  If 7PM-7AM, please contact night-coverage [email or web address] Password [email or web address]

## 2016-05-23 NOTE — Care Management Note (Signed)
Case Management Note Marvetta Gibbons RN, BSN Unit 2W-Case Manager [phone]  Patient Details  Name: Evan [name] MRN: [id number] Date of Birth: 11-01-1927  Subjective/Objective:    Pt admitted with PNA/Flu                Action/Plan: PTA pt lived at home, son- [name] to assist pt on discharge- contact #- [phone]- orders for HHRN/PT/OT/aide- spoke with pt and son at bedside- choice offered for [facility] services- per pt's son they would like to use [name] for [facility] services- HHRN/PT/OT/aide- referral called to Santiago Glad with [facility] - referral accepted.   Expected Discharge Date:  05/23/16               Expected Discharge Plan:  [place]  In-House Referral:     Discharge planning Services  CM Consult  Post Acute Care Choice:  Home Health Choice offered to:  Patient, Adult Children  DME Arranged:  N/A DME Agency:  NA  HH Arranged:  RN, PT, OT, Nurse's Aide [place] Agency:  [place]  Status of Service:  Completed, signed off  If discussed at [place] of Stay Meetings, dates discussed:    Discharge Disposition: home with home health   Additional Comments:  Dawayne Patricia, RN 05/23/2016, 3:08 PM

## 2016-05-23 NOTE — Progress Notes (Signed)
Patient discharged to home with son. All discharged instructions reviewed with patient. Case management set up home health for patient. All followup appts made for patient. Scripts given and reviewed. All patients belongings in tow . Patient left unit in stable condition in wheelchair.  Sheliah Plane RN

## 2016-05-24 ENCOUNTER — Telehealth: Payer: Self-pay | Admitting: Medical Oncology

## 2016-05-24 NOTE — Telephone Encounter (Signed)
Results of scan per [facility] called to pt.

## 2016-05-25 DIAGNOSIS — K219 Gastro-esophageal reflux disease without esophagitis: Secondary | ICD-10-CM | POA: Diagnosis not present

## 2016-05-25 DIAGNOSIS — C3491 Malignant neoplasm of unspecified part of right bronchus or lung: Secondary | ICD-10-CM | POA: Diagnosis not present

## 2016-05-25 DIAGNOSIS — I4891 Unspecified atrial fibrillation: Secondary | ICD-10-CM | POA: Diagnosis not present

## 2016-05-25 DIAGNOSIS — I25810 Atherosclerosis of coronary artery bypass graft(s) without angina pectoris: Secondary | ICD-10-CM | POA: Diagnosis not present

## 2016-05-25 DIAGNOSIS — J1100 Influenza due to unidentified influenza virus with unspecified type of pneumonia: Secondary | ICD-10-CM | POA: Diagnosis not present

## 2016-05-25 DIAGNOSIS — M069 Rheumatoid arthritis, unspecified: Secondary | ICD-10-CM | POA: Diagnosis not present

## 2016-05-25 DIAGNOSIS — F341 Dysthymic disorder: Secondary | ICD-10-CM | POA: Diagnosis not present

## 2016-05-25 DIAGNOSIS — I252 Old myocardial infarction: Secondary | ICD-10-CM | POA: Diagnosis not present

## 2016-05-25 DIAGNOSIS — I10 Essential (primary) hypertension: Secondary | ICD-10-CM | POA: Diagnosis not present

## 2016-05-26 ENCOUNTER — Ambulatory Visit (INDEPENDENT_AMBULATORY_CARE_PROVIDER_SITE_OTHER): Payer: Medicare HMO | Admitting: Family Medicine

## 2016-05-26 ENCOUNTER — Ambulatory Visit: Payer: Medicare HMO | Admitting: Family Medicine

## 2016-05-26 ENCOUNTER — Encounter: Payer: Self-pay | Admitting: Family Medicine

## 2016-05-26 VITALS — BP 126/62 | HR 68 | Temp 97.4°F | Resp 16 | Ht 68.0 in | Wt 169.0 lb

## 2016-05-26 DIAGNOSIS — J1100 Influenza due to unidentified influenza virus with unspecified type of pneumonia: Secondary | ICD-10-CM | POA: Diagnosis not present

## 2016-05-26 DIAGNOSIS — K219 Gastro-esophageal reflux disease without esophagitis: Secondary | ICD-10-CM | POA: Diagnosis not present

## 2016-05-26 DIAGNOSIS — J101 Influenza due to other identified influenza virus with other respiratory manifestations: Secondary | ICD-10-CM | POA: Diagnosis not present

## 2016-05-26 DIAGNOSIS — M069 Rheumatoid arthritis, unspecified: Secondary | ICD-10-CM | POA: Diagnosis not present

## 2016-05-26 DIAGNOSIS — J700 Acute pulmonary manifestations due to radiation: Secondary | ICD-10-CM

## 2016-05-26 DIAGNOSIS — J181 Lobar pneumonia, unspecified organism: Secondary | ICD-10-CM

## 2016-05-26 DIAGNOSIS — F341 Dysthymic disorder: Secondary | ICD-10-CM | POA: Diagnosis not present

## 2016-05-26 DIAGNOSIS — I25810 Atherosclerosis of coronary artery bypass graft(s) without angina pectoris: Secondary | ICD-10-CM | POA: Diagnosis not present

## 2016-05-26 DIAGNOSIS — I10 Essential (primary) hypertension: Secondary | ICD-10-CM | POA: Diagnosis not present

## 2016-05-26 DIAGNOSIS — Z09 Encounter for follow-up examination after completed treatment for conditions other than malignant neoplasm: Secondary | ICD-10-CM | POA: Diagnosis not present

## 2016-05-26 DIAGNOSIS — I252 Old myocardial infarction: Secondary | ICD-10-CM | POA: Diagnosis not present

## 2016-05-26 DIAGNOSIS — C3491 Malignant neoplasm of unspecified part of right bronchus or lung: Secondary | ICD-10-CM | POA: Diagnosis not present

## 2016-05-26 DIAGNOSIS — J1001 Influenza due to other identified influenza virus with the same other identified influenza virus pneumonia: Secondary | ICD-10-CM | POA: Diagnosis not present

## 2016-05-26 DIAGNOSIS — J189 Pneumonia, unspecified organism: Secondary | ICD-10-CM

## 2016-05-26 DIAGNOSIS — I4891 Unspecified atrial fibrillation: Secondary | ICD-10-CM | POA: Diagnosis not present

## 2016-05-26 LAB — COMPLETE METABOLIC PANEL WITH GFR
ALT: 11 U/L (ref 9–46)
AST: 17 U/L (ref 10–35)
Albumin: 3.4 g/dL — ABNORMAL LOW (ref 3.6–5.1)
Alkaline Phosphatase: 69 U/L (ref 40–115)
BUN: 17 mg/dL (ref 7–25)
CO2: 18 mmol/L — ABNORMAL LOW (ref 20–31)
Calcium: 8.5 mg/dL — ABNORMAL LOW (ref 8.6–10.3)
Chloride: 108 mmol/L (ref 98–110)
Creat: 1.05 mg/dL (ref 0.70–1.11)
GFR, Est African American: 74 mL/min (ref >=60)
GFR, Est Non African American: 64 mL/min (ref >=60)
Glucose, Bld: 155 mg/dL — ABNORMAL HIGH (ref 70–99)
Potassium: 4.1 mmol/L (ref 3.5–5.3)
Sodium: 136 mmol/L (ref 135–146)
Total Bilirubin: 1 mg/dL (ref 0.2–1.2)
Total Protein: 6 g/dL — ABNORMAL LOW (ref 6.1–8.1)

## 2016-05-26 LAB — CBC WITH DIFFERENTIAL/PLATELET
Basophils Absolute: 0 cells/uL (ref 0–200)
Basophils Relative: 0 %
Eosinophils Absolute: 0 cells/uL — ABNORMAL LOW (ref 15–500)
Eosinophils Relative: 0 %
HCT: 38.6 % (ref 38.5–50.0)
Hemoglobin: 12.7 g/dL — ABNORMAL LOW (ref 13.0–17.0)
Lymphocytes Relative: 9 %
Lymphs Abs: 585 cells/uL — ABNORMAL LOW (ref 850–3900)
MCH: 30.2 pg (ref 27.0–33.0)
MCHC: 32.9 g/dL (ref 32.0–36.0)
MCV: 91.7 fL (ref 80.0–100.0)
MPV: 12 fL (ref 7.5–12.5)
Monocytes Absolute: 585 cells/uL (ref 200–950)
Monocytes Relative: 9 %
Neutro Abs: 5330 cells/uL (ref 1500–7800)
Neutrophils Relative %: 82 %
Platelets: 91 10*3/uL — ABNORMAL LOW (ref 140–400)
RBC: 4.21 MIL/uL (ref 4.20–5.80)
RDW: 14.1 % (ref 11.0–15.0)
WBC: 6.5 10*3/uL (ref 3.8–10.8)

## 2016-05-26 LAB — CULTURE, BLOOD (ROUTINE X 2)
Culture: NO GROWTH
Culture: NO GROWTH

## 2016-05-26 MED ORDER — ALBUTEROL SULFATE HFA 108 (90 BASE) MCG/ACT IN AERS: 1.0000 | INHALATION_SPRAY | Freq: Four times a day (QID) | RESPIRATORY_TRACT | 2 refills | Status: DC | PRN

## 2016-05-26 MED ORDER — SACCHAROMYCES BOULARDII 250 MG PO CAPS: 250.0000 mg | ORAL_CAPSULE | Freq: Two times a day (BID) | ORAL | 0 refills | Status: DC

## 2016-05-26 MED ORDER — METRONIDAZOLE 500 MG PO TABS: 500.0000 mg | ORAL_TABLET | Freq: Three times a day (TID) | ORAL | 0 refills | Status: DC

## 2016-05-26 NOTE — Progress Notes (Signed)
Subjective:    Patient ID: Evan [name], male    DOB: [birth year], 83 y.o.   MRN: [id number]  HPI   05/16/16 83 yo man with clinical stage IA, NSCLC, Adenocarcinoma of the right lower lung.Has completed radiotherapy. Scheduled to see Dr. Julien Nordmann in Feb.  CT scan in 04/2016 revealed: Today's study demonstrates evolving postradiation changes throughout the right mid to lower lung. Previously demonstrated right lower lobe pulmonary nodule has increased in size and currently is a 3.6 x 2.9 x 1.8 cm mass which now makes broad contact with the overlying pleura. No associated pleural effusion at this time. Attention on followup studies is recommended.  Patient states these had a dry nonproductive cough gradually worsening over the last 4 weeks. It is a constant irritating cough. He denies any hemoptysis. He denies any chest pain. He denies any purulent sputum. He denies any fever. He denies any pleurisy. He denies any shortness of breath.  At that time, my plan was: Based on his previous reports, I suspect radiation pneumonitis. I will send the patient for chest x-ray today to rule out infection although I do not appreciate any clinical findings of pneumonia. Begin prednisone 40 mg a day and recheck in one week. At that time I'll begin to wean the patient down if his cough is starting to improve. Meanwhile I will treat the patient's cough symptomatically with Hycodan 1 teaspoon every 4-6 hours as needed for cough. Should his cough worsen or should he develop fevers or shortness of breath he needs to return immediately  Addendum: Son, Alvester Chou, called back threatening that there will be "hell to pay" if anyone sends his dad to [facility] again.  I explained to the son that his dad could go anywhere to get a CXR, I had sent the order to [facility] because they were going to get groceries in [place].  Son had no other concerns and phone call was ended.    Addendum 05/20/16 Patient saw his oncologist who  also recommended a chest x-ray yesterday on the 15th. Patient never got a chest x-ray that I ordered on the 12th. They never got the chest x-ray yesterday. When I saw the patient had an appointment for this morning I called his son to ask him to get the chest x-ray. In short order, the son begin to curse and scream.  Eventually the son brought the patient to the office where I again asked him to go get the chest x-ray.  Son was belligerent to me and my staff.  He stated that he wished he were my receptionist's husband so he could "beat that bitch's ass."  I asked him to leave.  Finally, x-ray was obtained [facility]. I reviewed the results at approximately 12:30. There appears to be a chronic right lower lobe infiltrate. Son called back stating that his father is too sick. He's having to hold him up. At that point I recommended that he go to the hospital if he is that sick. Again the son begin to curse and threaten me.  In an effort to care for the patient I am calling out Ceftin 500 mg by mouth twice a day as well as a Z-Pak to cover possible community-acquired pneumonia. I would continue the prednisone as there are also changes consistent with possible radiation pneumonitis. I explained to the patient's son that his belligerent behavior is preventing [place] from caring for his father.  05/26/16 Ultimately became febrile on the evening of the 16th  and was taken to the hospital where he was diagnosed with right lower lobe pneumonia treated with ceftriaxone and Zithromax. He was discharged on Levaquin. He was also diagnosed with the flu and was treated with Tamiflu. He is here today for follow-up discharge summary recommends a CBC and a CMP.  Since leaving the hospital, his cough has improved as has his breathing. However he is developed severe diarrhea. The patient has a history of C. difficile colitis. Today his color looks very poor. He is pale and weak. His difficult for him to stand and walk. He states that he  has to use the bathroom every hour and is nothing but water. This raises the concern for C. difficile colitis.  Past Medical History:  Diagnosis Date  . Adenocarcinoma of right lung ([place]) 01/06/2016  . Anginal chest pain at rest [facility]    Chronic non, controlled on Lorazepam  . Anxiety   . Arthritis    "all over"   . BPH (benign prostatic hyperplasia)   . CAD (coronary artery disease)   . Chronic lower back pain   . Complication of anesthesia    "problems making water afterwards"  . Depression   . DJD (degenerative joint disease)   . Esophageal ulcer without bleeding    bid ppi indefinitely  . Family history of adverse reaction to anesthesia    "children w/PONV"  . GERD (gastroesophageal reflux disease)   . Headache    "couple/week maybe" (09/04/2015)  . Hemochromatosis    Possible, elevated iron stores, hook-like osteophytes on hand films, normal LFTs  . Hepatitis    "yellow jaundice as a baby"  . History of ASCVD    MULTIVESSEL  . Hyperlipidemia   . Hypertension   . Myocardial infarction 1999  . Osteoarthritis   . Pneumonia    "several times; got a little now" (09/04/2015)  . Rheumatoid arthritis [facility])    Past Surgical History:  Procedure Laterality Date  . ARM SKIN LESION BIOPSY / EXCISION Left 09/02/2015  . CATARACT EXTRACTION W/ INTRAOCULAR LENS  IMPLANT, BILATERAL Bilateral   . CHOLECYSTECTOMY OPEN    . CORNEAL TRANSPLANT Bilateral    "one at [facility]; one at [name]"  . CORONARY ANGIOPLASTY WITH STENT PLACEMENT    . CORONARY ARTERY BYPASS GRAFT  1999  . DESCENDING AORTIC ANEURYSM REPAIR W/ STENT     DE stent ostium into the right radial free graft at OM-, 02-2007  . ESOPHAGOGASTRODUODENOSCOPY N/A 11/09/2014   Procedure: ESOPHAGOGASTRODUODENOSCOPY (EGD);  Surgeon: [name], MD;  Location: [facility] END[facility];  Service: Endoscopy;  Laterality: N/A;  . INGUINAL HERNIA REPAIR    . JOINT REPLACEMENT    . NASAL SINUS SURGERY    . SHOULDER OPEN ROTATOR CUFF REPAIR Right   . TOTAL  KNEE ARTHROPLASTY Bilateral    Current Outpatient Prescriptions on File Prior to Visit  Medication Sig Dispense Refill  . albuterol (VENTOLIN HFA) 108 (90 Base) MCG/ACT inhaler Inhale 1-2 puffs into the lungs every 6 (six) hours as needed for wheezing or shortness of breath. 18 g 2  . atenolol (TENORMIN) 50 MG tablet TAKE 1 TABLET BY MOUTH  EVERY DAY 30 tablet 1  . Cholecalciferol 2000 units TABS Take 1 tablet (2,000 Units total) by mouth daily. (Patient taking differently: Take 1 tablet by mouth once a week. ) 30 each 11  . clopidogrel (PLAVIX) 75 MG tablet Take 1 tablet (75 mg total) by mouth daily. 90 tablet 2  . Cyanocobalamin (VITAMIN B 12 PO)  Take 1 tablet by mouth daily.     . diclofenac sodium (VOLTAREN) 1 % GEL Apply 2 g topically daily as needed (for pain in knees and feet).    Marland Kitchen FLUoxetine (PROZAC) 20 MG capsule TAKE ONE CAPSULE BY MOUTH DAILY 90 capsule 3  . HYDROcodone-acetaminophen (NORCO/VICODIN) 5-325 MG tablet Take 1 tablet by mouth as needed for pain.    Marland Kitchen leflunomide (ARAVA) 10 MG tablet Take 10 mg by mouth daily.     Marland Kitchen levofloxacin (LEVAQUIN) 500 MG tablet Take 1 tablet (500 mg total) by mouth daily. 5 tablet 0  . LORazepam (ATIVAN) 1 MG tablet TAKE ONE (1) TABLET BY MOUTH 3 TIMES DAILY (TAKE AT EIGHT IN THE MORNING NOONAND FOUR PM) 90 tablet 2  . nitroGLYCERIN (NITROSTAT) 0.4 MG SL tablet Place 1 tablet (0.4 mg total) under the tongue every 5 (five) minutes as needed for chest pain. 25 tablet 0  . oseltamivir (TAMIFLU) 30 MG capsule Take 1 capsule (30 mg total) by mouth 2 (two) times daily. 6 capsule 0  . pantoprazole (PROTONIX) 40 MG tablet Take 1 tablet (40 mg total) by mouth daily. 90 tablet 2  . rosuvastatin (CRESTOR) 5 MG tablet Take 1 tablet (5 mg total) by mouth daily. 90 tablet 2  . Spacer/Aero-Holding Chambers (AEROCHAMBER PLUS WITH MASK) inhaler Use as instructed 1 each 2  . tamsulosin (FLOMAX) 0.4 MG CAPS capsule Take 0.4 mg by mouth daily after supper.    .  traMADol (ULTRAM) 50 MG tablet Take 1 tablet by mouth every 12 (twelve) hours as needed.     . traZODone (DESYREL) 50 MG tablet Take 50 mg by mouth at bedtime as needed for sleep.     No current facility-administered medications on file prior to visit.    Allergies  Allergen Reactions  . Feldene [Piroxicam] Other (See Comments)    REACTION: Blisters  . Statins Other (See Comments)    Myalgias  . Doxycycline Other (See Comments)    REACTION:  unknown  . Latex Rash  . Tequin Anxiety  . Valium Other (See Comments)    REACTION:  unknown   Social History   Social History  . Marital status: Widowed    Spouse name: N/A  . Number of children: N/A  . Years of education: N/A   Occupational History  . Not on file.   Social History Main Topics  . Smoking status: Never Smoker  . Smokeless tobacco: Former Systems developer    Types: Chew     Comment: "quit chewing in the 1960s"  . Alcohol use No  . Drug use: No  . Sexual activity: No   Other Topics Concern  . Not on file   Social History Narrative  . No narrative on file    Review of Systems  All other systems reviewed and are negative.      Objective:   Physical Exam  HENT:  Right Ear: External ear normal.  Left Ear: External ear normal.  Nose: Nose normal.  Mouth/Throat: Oropharynx is clear and moist. No oropharyngeal exudate.  Eyes: Conjunctivae are normal.  Neck: Neck supple.  Cardiovascular: Normal rate, regular rhythm and normal heart sounds.   Pulmonary/Chest: Effort normal and breath sounds normal. No respiratory distress. He has no wheezes. He has no rales. He exhibits no tenderness.  Lymphadenopathy:    He has no cervical adenopathy.  Vitals reviewed.         Assessment & Plan:  Hospital discharge follow-up  Pneumonia of  right lower lobe due to infectious organism [facility])  Radiation pneumonitis (HCC)  Influenza A  From the standpoint of his pneumonia and influenza, the patient appears to be improving.  However he seems to be worsening due to diarrhea. He appears very weak. He is at high risk for C. difficile colitis and dehydration. Therefore I'm going to start the patient empirically on Flagyl 500 mg by mouth 3 times a day in addition to florastor 250 mg pobid and recheck next week. He is to go to the hospital immediately if he is getting worse. I will also check a CBC and CMP to evaluate for dehydration. I've encouraged the patient to drink plenty of Gatorade and also to try to drink 1 or 2 ensures a day to get more protein.

## 2016-05-27 ENCOUNTER — Telehealth: Payer: Self-pay | Admitting: *Deleted

## 2016-05-27 DIAGNOSIS — F341 Dysthymic disorder: Secondary | ICD-10-CM | POA: Diagnosis not present

## 2016-05-27 DIAGNOSIS — I4891 Unspecified atrial fibrillation: Secondary | ICD-10-CM | POA: Diagnosis not present

## 2016-05-27 DIAGNOSIS — K219 Gastro-esophageal reflux disease without esophagitis: Secondary | ICD-10-CM | POA: Diagnosis not present

## 2016-05-27 DIAGNOSIS — J1100 Influenza due to unidentified influenza virus with unspecified type of pneumonia: Secondary | ICD-10-CM | POA: Diagnosis not present

## 2016-05-27 DIAGNOSIS — I252 Old myocardial infarction: Secondary | ICD-10-CM | POA: Diagnosis not present

## 2016-05-27 DIAGNOSIS — I10 Essential (primary) hypertension: Secondary | ICD-10-CM | POA: Diagnosis not present

## 2016-05-27 DIAGNOSIS — I25810 Atherosclerosis of coronary artery bypass graft(s) without angina pectoris: Secondary | ICD-10-CM | POA: Diagnosis not present

## 2016-05-27 DIAGNOSIS — C3491 Malignant neoplasm of unspecified part of right bronchus or lung: Secondary | ICD-10-CM | POA: Diagnosis not present

## 2016-05-27 DIAGNOSIS — M069 Rheumatoid arthritis, unspecified: Secondary | ICD-10-CM | POA: Diagnosis not present

## 2016-05-27 NOTE — Telephone Encounter (Signed)
Call from pt transferred from Triage, pt states " I want to talk to Dr. Julien Nordmann. I will not talk to anyone else." Discussed with pt MD is in a room with a pt, is there a message or concern I can give to MD.  Pt declined speaking to this RN again states " i'm not telling you anything. I want to talk to Dr. Julien Nordmann." Informed pt I will give MD message. Reviewed with MD.  Returned call to pt, son answered. Per MD,  this is not related to his cancer. Pt son advised he would like to talk to MD if not he will come up to the hospital and talk to him. Pt call put on speaker phone and MD discussed with son the scan results and xray.  Pt son has additional concerns regarding radiation. Pt son thanked Dr. Julien Nordmann for speaking with him regarding his and pt's concerns. Pt son verbalized understanding and denied any further concerns.

## 2016-05-27 NOTE — Telephone Encounter (Signed)
Pt called lmovm to call back ASAP at [phone].  Returned call to pt at [phone]. Received son's voice mail. Left message for pt or family member to call back as I was returning pt's call.

## 2016-05-30 ENCOUNTER — Telehealth: Payer: Self-pay | Admitting: Family Medicine

## 2016-05-30 DIAGNOSIS — R269 Unspecified abnormalities of gait and mobility: Secondary | ICD-10-CM | POA: Diagnosis not present

## 2016-05-30 NOTE — Telephone Encounter (Signed)
Patient's son, Rian Barry Romero, is a patient of Dr.Duncan's.  Barry is asking if Dr.Duncan could see his father,Evan [name], sooner than August.  Patient's unhappy with his current doctor and met Dr.Duncan one time when Barry was being seen.  He liked Dr.Duncan.  Patient was recently seen at [facility] for pneumonia and flu.  Patient has lost 30 lbs.. Barry said it's not an emergency for patient to be seen, but he would like him to be seen before August. °

## 2016-05-31 ENCOUNTER — Inpatient Hospital Stay: Payer: Medicare HMO | Admitting: Family Medicine

## 2016-05-31 DIAGNOSIS — I10 Essential (primary) hypertension: Secondary | ICD-10-CM | POA: Diagnosis not present

## 2016-05-31 DIAGNOSIS — C3491 Malignant neoplasm of unspecified part of right bronchus or lung: Secondary | ICD-10-CM | POA: Diagnosis not present

## 2016-05-31 DIAGNOSIS — I25810 Atherosclerosis of coronary artery bypass graft(s) without angina pectoris: Secondary | ICD-10-CM | POA: Diagnosis not present

## 2016-05-31 DIAGNOSIS — I4891 Unspecified atrial fibrillation: Secondary | ICD-10-CM | POA: Diagnosis not present

## 2016-05-31 DIAGNOSIS — F341 Dysthymic disorder: Secondary | ICD-10-CM | POA: Diagnosis not present

## 2016-05-31 DIAGNOSIS — I252 Old myocardial infarction: Secondary | ICD-10-CM | POA: Diagnosis not present

## 2016-05-31 DIAGNOSIS — J1100 Influenza due to unidentified influenza virus with unspecified type of pneumonia: Secondary | ICD-10-CM | POA: Diagnosis not present

## 2016-05-31 DIAGNOSIS — K219 Gastro-esophageal reflux disease without esophagitis: Secondary | ICD-10-CM | POA: Diagnosis not present

## 2016-05-31 DIAGNOSIS — M069 Rheumatoid arthritis, unspecified: Secondary | ICD-10-CM | POA: Diagnosis not present

## 2016-05-31 NOTE — Telephone Encounter (Signed)
I spoke to [place] and he said he's lost confidence in [place].  He scheduled appointment with Dr.Duncan for patient the end of June.

## 2016-05-31 NOTE — Telephone Encounter (Signed)
Okay but I can't fill his meds or address issues until I see him in clinic.  Thanks.

## 2016-05-31 NOTE — Telephone Encounter (Signed)
It looks like patient is seeing [name], in whom I have great confidence because he has a great doc. It is unlikely that I would provide care the was significantly different from [name]. The patient needs to know that ahead of time. That said, if he wants to transfer over then offer summertime appointment, would need 30 minute appointment. Thanks.

## 2016-06-01 ENCOUNTER — Telehealth: Payer: Self-pay | Admitting: Family Medicine

## 2016-06-01 NOTE — Telephone Encounter (Signed)
Received call from Judson Roch from [facility] stating that the pt is in a panic over having diarrhea. She states that he sounds very upset and is talking about going to the ER. I informed her I would call and talk to pt and/or son and would call her back. Pt has been having diarrhea for an extended period of time. Called and spoke to [place] about the situation. He states that they are not giving him anything for the diarrhea but he thinks the ensure is making it worse. Informed him to give Imodium now and repeat in 4 hours (14m the 1st dose and 134min 4 hours as they have liquid imodium) if no diarrhea after the second dose to dc imodium however if no better by the afternoon to call me back and would put him on schedule to see Dr. PiDennard Schaumannomorrow. Also informed him to stop the ensure for now until diarrhea is under control. BaAlvester Chouerbalized understanding and will call back if appt is needed.   Called and [facility] informed of recommendations.

## 2016-06-02 DIAGNOSIS — M069 Rheumatoid arthritis, unspecified: Secondary | ICD-10-CM | POA: Diagnosis not present

## 2016-06-02 DIAGNOSIS — I4891 Unspecified atrial fibrillation: Secondary | ICD-10-CM | POA: Diagnosis not present

## 2016-06-02 DIAGNOSIS — C3491 Malignant neoplasm of unspecified part of right bronchus or lung: Secondary | ICD-10-CM | POA: Diagnosis not present

## 2016-06-02 DIAGNOSIS — I252 Old myocardial infarction: Secondary | ICD-10-CM | POA: Diagnosis not present

## 2016-06-02 DIAGNOSIS — I10 Essential (primary) hypertension: Secondary | ICD-10-CM | POA: Diagnosis not present

## 2016-06-02 DIAGNOSIS — F341 Dysthymic disorder: Secondary | ICD-10-CM | POA: Diagnosis not present

## 2016-06-02 DIAGNOSIS — J1100 Influenza due to unidentified influenza virus with unspecified type of pneumonia: Secondary | ICD-10-CM | POA: Diagnosis not present

## 2016-06-02 DIAGNOSIS — K219 Gastro-esophageal reflux disease without esophagitis: Secondary | ICD-10-CM | POA: Diagnosis not present

## 2016-06-02 DIAGNOSIS — I25810 Atherosclerosis of coronary artery bypass graft(s) without angina pectoris: Secondary | ICD-10-CM | POA: Diagnosis not present

## 2016-06-03 DIAGNOSIS — I4891 Unspecified atrial fibrillation: Secondary | ICD-10-CM | POA: Diagnosis not present

## 2016-06-03 DIAGNOSIS — I252 Old myocardial infarction: Secondary | ICD-10-CM | POA: Diagnosis not present

## 2016-06-03 DIAGNOSIS — I25810 Atherosclerosis of coronary artery bypass graft(s) without angina pectoris: Secondary | ICD-10-CM | POA: Diagnosis not present

## 2016-06-03 DIAGNOSIS — J1100 Influenza due to unidentified influenza virus with unspecified type of pneumonia: Secondary | ICD-10-CM | POA: Diagnosis not present

## 2016-06-03 DIAGNOSIS — K219 Gastro-esophageal reflux disease without esophagitis: Secondary | ICD-10-CM | POA: Diagnosis not present

## 2016-06-03 DIAGNOSIS — M069 Rheumatoid arthritis, unspecified: Secondary | ICD-10-CM | POA: Diagnosis not present

## 2016-06-03 DIAGNOSIS — C3491 Malignant neoplasm of unspecified part of right bronchus or lung: Secondary | ICD-10-CM | POA: Diagnosis not present

## 2016-06-03 DIAGNOSIS — F341 Dysthymic disorder: Secondary | ICD-10-CM | POA: Diagnosis not present

## 2016-06-03 DIAGNOSIS — I10 Essential (primary) hypertension: Secondary | ICD-10-CM | POA: Diagnosis not present

## 2016-06-04 DIAGNOSIS — I4891 Unspecified atrial fibrillation: Secondary | ICD-10-CM | POA: Diagnosis not present

## 2016-06-04 DIAGNOSIS — I25810 Atherosclerosis of coronary artery bypass graft(s) without angina pectoris: Secondary | ICD-10-CM | POA: Diagnosis not present

## 2016-06-04 DIAGNOSIS — I252 Old myocardial infarction: Secondary | ICD-10-CM | POA: Diagnosis not present

## 2016-06-04 DIAGNOSIS — J1100 Influenza due to unidentified influenza virus with unspecified type of pneumonia: Secondary | ICD-10-CM | POA: Diagnosis not present

## 2016-06-04 DIAGNOSIS — I10 Essential (primary) hypertension: Secondary | ICD-10-CM | POA: Diagnosis not present

## 2016-06-04 DIAGNOSIS — M069 Rheumatoid arthritis, unspecified: Secondary | ICD-10-CM | POA: Diagnosis not present

## 2016-06-04 DIAGNOSIS — F341 Dysthymic disorder: Secondary | ICD-10-CM | POA: Diagnosis not present

## 2016-06-04 DIAGNOSIS — K219 Gastro-esophageal reflux disease without esophagitis: Secondary | ICD-10-CM | POA: Diagnosis not present

## 2016-06-04 DIAGNOSIS — C3491 Malignant neoplasm of unspecified part of right bronchus or lung: Secondary | ICD-10-CM | POA: Diagnosis not present

## 2016-06-05 DIAGNOSIS — K219 Gastro-esophageal reflux disease without esophagitis: Secondary | ICD-10-CM | POA: Diagnosis not present

## 2016-06-05 DIAGNOSIS — J1100 Influenza due to unidentified influenza virus with unspecified type of pneumonia: Secondary | ICD-10-CM | POA: Diagnosis not present

## 2016-06-05 DIAGNOSIS — I252 Old myocardial infarction: Secondary | ICD-10-CM | POA: Diagnosis not present

## 2016-06-05 DIAGNOSIS — C3491 Malignant neoplasm of unspecified part of right bronchus or lung: Secondary | ICD-10-CM | POA: Diagnosis not present

## 2016-06-05 DIAGNOSIS — I10 Essential (primary) hypertension: Secondary | ICD-10-CM | POA: Diagnosis not present

## 2016-06-05 DIAGNOSIS — I4891 Unspecified atrial fibrillation: Secondary | ICD-10-CM | POA: Diagnosis not present

## 2016-06-05 DIAGNOSIS — M069 Rheumatoid arthritis, unspecified: Secondary | ICD-10-CM | POA: Diagnosis not present

## 2016-06-05 DIAGNOSIS — I25810 Atherosclerosis of coronary artery bypass graft(s) without angina pectoris: Secondary | ICD-10-CM | POA: Diagnosis not present

## 2016-06-05 DIAGNOSIS — F341 Dysthymic disorder: Secondary | ICD-10-CM | POA: Diagnosis not present

## 2016-06-06 ENCOUNTER — Encounter: Payer: Self-pay | Admitting: Family Medicine

## 2016-06-06 ENCOUNTER — Ambulatory Visit: Payer: Commercial Managed Care - HMO | Admitting: Physician Assistant

## 2016-06-06 DIAGNOSIS — J189 Pneumonia, unspecified organism: Secondary | ICD-10-CM | POA: Diagnosis not present

## 2016-06-07 DIAGNOSIS — J1100 Influenza due to unidentified influenza virus with unspecified type of pneumonia: Secondary | ICD-10-CM | POA: Diagnosis not present

## 2016-06-07 DIAGNOSIS — F341 Dysthymic disorder: Secondary | ICD-10-CM | POA: Diagnosis not present

## 2016-06-07 DIAGNOSIS — I4891 Unspecified atrial fibrillation: Secondary | ICD-10-CM | POA: Diagnosis not present

## 2016-06-07 DIAGNOSIS — K219 Gastro-esophageal reflux disease without esophagitis: Secondary | ICD-10-CM | POA: Diagnosis not present

## 2016-06-07 DIAGNOSIS — I25810 Atherosclerosis of coronary artery bypass graft(s) without angina pectoris: Secondary | ICD-10-CM | POA: Diagnosis not present

## 2016-06-07 DIAGNOSIS — I252 Old myocardial infarction: Secondary | ICD-10-CM | POA: Diagnosis not present

## 2016-06-07 DIAGNOSIS — I10 Essential (primary) hypertension: Secondary | ICD-10-CM | POA: Diagnosis not present

## 2016-06-07 DIAGNOSIS — C3491 Malignant neoplasm of unspecified part of right bronchus or lung: Secondary | ICD-10-CM | POA: Diagnosis not present

## 2016-06-07 DIAGNOSIS — M069 Rheumatoid arthritis, unspecified: Secondary | ICD-10-CM | POA: Diagnosis not present

## 2016-06-08 ENCOUNTER — Encounter: Payer: Self-pay | Admitting: Internal Medicine

## 2016-06-08 ENCOUNTER — Telehealth: Payer: Self-pay | Admitting: *Deleted

## 2016-06-08 NOTE — Progress Notes (Signed)
Evan [name] has been calling the desk nurse as well as the triage nurse at the [facility] several times over the last 2-3 weeks with threatening conversations many times for unclear reasons. He had recent imaging studies at the emergency room and wanted [place] to discuss the results with him again. I personally did this 2 weeks ago and explained to him that he may have some radiation induced pneumonitis and this is expected to improve over time. The patient is followed by Dr. Tammi Klippel from radiation oncology. He has not received any chemotherapy or other treatment for medical oncology. I spoke to Dr. Tammi Klippel earlier today and asked him to see Evan [name] to go over his his scan results and the radiation pneumonitis. I don't see a need to see this patient at regular basis. He is not a candidate for any future systemic treatment. He can be followed by either his primary care physician or radiation oncology. Dr. Tammi Klippel promised to call the patient on her bring him for a visit to discuss his concerns. I will discharge him from the clinic.

## 2016-06-08 NOTE — Telephone Encounter (Addendum)
Call received from patient yelling.   10:43 am:  "I need to speak with Dr. Julien Nordmann or his nurse.  I had xray report sent to him yesterday.  I need to know has he read it ASAP!   Call transferred [phone].  10:48 am voicemail (Yelling): "What in the hell.  Shit, you mother-fucking bitches .... If I was your husband I'd punch you and knock your eyes out your head... This message heard by other Triage nurse was shared with this nurse.  Dr. Julien Nordmann notified of patient calls by this nurse.  Verbal order received and read back for patient to contact Radiation provider, Pulmonologist, PCP or go to the Hospital.  10:54 am voicemail (yelling):  "You all must be a bunch of bitches then.  You God damn chicken shit sons of bitches.  I do not want voicemail.    11:02 am voicemail (yelling):  "Stop playing bitches.  I'm on my way there now!"  12:58 pm (yelling) "I need to talk to Dr. Julien Nordmann.  I had an xray done by [facility] sent to Dr, Julien Nordmann to read to see if anything is wrong because no one else can find anything wrong."  Instructed to contact Pulmonologist, Radiation or PCP.  Continued yelling "No don't tell me who to call,  I want to talk with Dr. Julien Nordmann.  Advised these are instructions from provider.  Yelling continued.  He hung up ending call.

## 2016-06-08 NOTE — Telephone Encounter (Signed)
CALLED PATIENT TO OFFER AN APPT. WITH PA, ALISON PERKINS, LVM FOR A RETURN CALL

## 2016-06-09 ENCOUNTER — Telehealth: Payer: Self-pay | Admitting: Medical Oncology

## 2016-06-09 DIAGNOSIS — J1100 Influenza due to unidentified influenza virus with unspecified type of pneumonia: Secondary | ICD-10-CM | POA: Diagnosis not present

## 2016-06-09 DIAGNOSIS — I10 Essential (primary) hypertension: Secondary | ICD-10-CM | POA: Diagnosis not present

## 2016-06-09 DIAGNOSIS — I4891 Unspecified atrial fibrillation: Secondary | ICD-10-CM | POA: Diagnosis not present

## 2016-06-09 DIAGNOSIS — F341 Dysthymic disorder: Secondary | ICD-10-CM | POA: Diagnosis not present

## 2016-06-09 DIAGNOSIS — I25810 Atherosclerosis of coronary artery bypass graft(s) without angina pectoris: Secondary | ICD-10-CM | POA: Diagnosis not present

## 2016-06-09 DIAGNOSIS — K219 Gastro-esophageal reflux disease without esophagitis: Secondary | ICD-10-CM | POA: Diagnosis not present

## 2016-06-09 DIAGNOSIS — C3491 Malignant neoplasm of unspecified part of right bronchus or lung: Secondary | ICD-10-CM | POA: Diagnosis not present

## 2016-06-09 DIAGNOSIS — I252 Old myocardial infarction: Secondary | ICD-10-CM | POA: Diagnosis not present

## 2016-06-09 DIAGNOSIS — M069 Rheumatoid arthritis, unspecified: Secondary | ICD-10-CM | POA: Diagnosis not present

## 2016-06-09 NOTE — Telephone Encounter (Signed)
asking if Evan [name] looked at CXR done at pt home ( mobile) . " I called yesterday and got irate because I couldn't get an answer"'. Per Evan [name] I told Evan [name] that [facility] did see CXR report and said pt's  CXR was fine. Evan [name] said "okay".

## 2016-06-10 ENCOUNTER — Telehealth: Payer: Self-pay | Admitting: Family Medicine

## 2016-06-10 ENCOUNTER — Telehealth: Payer: Self-pay | Admitting: Medical Oncology

## 2016-06-10 DIAGNOSIS — I252 Old myocardial infarction: Secondary | ICD-10-CM | POA: Diagnosis not present

## 2016-06-10 DIAGNOSIS — I10 Essential (primary) hypertension: Secondary | ICD-10-CM | POA: Diagnosis not present

## 2016-06-10 DIAGNOSIS — C3491 Malignant neoplasm of unspecified part of right bronchus or lung: Secondary | ICD-10-CM | POA: Diagnosis not present

## 2016-06-10 DIAGNOSIS — I25810 Atherosclerosis of coronary artery bypass graft(s) without angina pectoris: Secondary | ICD-10-CM | POA: Diagnosis not present

## 2016-06-10 DIAGNOSIS — I4891 Unspecified atrial fibrillation: Secondary | ICD-10-CM | POA: Diagnosis not present

## 2016-06-10 DIAGNOSIS — K219 Gastro-esophageal reflux disease without esophagitis: Secondary | ICD-10-CM | POA: Diagnosis not present

## 2016-06-10 DIAGNOSIS — F341 Dysthymic disorder: Secondary | ICD-10-CM | POA: Diagnosis not present

## 2016-06-10 DIAGNOSIS — M069 Rheumatoid arthritis, unspecified: Secondary | ICD-10-CM | POA: Diagnosis not present

## 2016-06-10 DIAGNOSIS — J1100 Influenza due to unidentified influenza virus with unspecified type of pneumonia: Secondary | ICD-10-CM | POA: Diagnosis not present

## 2016-06-10 NOTE — Telephone Encounter (Signed)
Per Dr Julien Nordmann I told pt's son, Alvester Chou,  that pt does not need to f/u with [facility] . He should follow up with Dr Tammi Klippel and W[facility] Flank since they are ordering f/u scans. Alvester Chou then asked for a f/u  appt with X[facility] and I told him I will send note to xrt re appt request.  I cancelled pt appts with [facility].

## 2016-06-10 NOTE — Telephone Encounter (Signed)
Pt had mobile CXR done on 06/06/16 - Alvester Chou called and wanted to know if we had the report yet and I told him no not yet. He stated that the [facility] nurse was there at that time and would have her get it faxed to [place]. On 06/07/16 - Received results from [facility] about Pt having mobile CXR - X-ray was clear per [name]. Pt's son Alvester Chou was called and informed of results and he requested it be faxed to cancer center. Results faxed to cancer center with confirmation. Then on 06/08/16 Alvester Chou called back demanding results of his father's xray and I informed him that I had just spoken to you about it yesterday and it was clear and it was faxed to where he wanted it to go. He said thank you and hung up.

## 2016-06-12 NOTE — Telephone Encounter (Signed)
Sam- will you offer him an appt if one's not yet been made?

## 2016-06-13 ENCOUNTER — Telehealth: Payer: Self-pay | Admitting: *Deleted

## 2016-06-13 DIAGNOSIS — I25810 Atherosclerosis of coronary artery bypass graft(s) without angina pectoris: Secondary | ICD-10-CM | POA: Diagnosis not present

## 2016-06-13 DIAGNOSIS — K219 Gastro-esophageal reflux disease without esophagitis: Secondary | ICD-10-CM | POA: Diagnosis not present

## 2016-06-13 DIAGNOSIS — I252 Old myocardial infarction: Secondary | ICD-10-CM | POA: Diagnosis not present

## 2016-06-13 DIAGNOSIS — C3491 Malignant neoplasm of unspecified part of right bronchus or lung: Secondary | ICD-10-CM | POA: Diagnosis not present

## 2016-06-13 DIAGNOSIS — M069 Rheumatoid arthritis, unspecified: Secondary | ICD-10-CM | POA: Diagnosis not present

## 2016-06-13 DIAGNOSIS — F341 Dysthymic disorder: Secondary | ICD-10-CM | POA: Diagnosis not present

## 2016-06-13 DIAGNOSIS — J1100 Influenza due to unidentified influenza virus with unspecified type of pneumonia: Secondary | ICD-10-CM | POA: Diagnosis not present

## 2016-06-13 DIAGNOSIS — I4891 Unspecified atrial fibrillation: Secondary | ICD-10-CM | POA: Diagnosis not present

## 2016-06-13 DIAGNOSIS — I10 Essential (primary) hypertension: Secondary | ICD-10-CM | POA: Diagnosis not present

## 2016-06-13 NOTE — Telephone Encounter (Signed)
No visit or further calls received.  Will close this encounter.  Notify security of any further calls of this nature [phone] or [phone].

## 2016-06-13 NOTE — Telephone Encounter (Signed)
Evan [name].  Please put this patient on for follow up with Ashlyn on a day that Tammi Klippel is in the office. Please phone the patient with his appointment date and time. Thank you.   Sam

## 2016-06-13 NOTE — Telephone Encounter (Signed)
CALLED PATIENT TO INFORM OF FU WITH ASHLYN [place] ON 06-29-16 @ 10:15 AM, LVM FOR A RETURN CALL

## 2016-06-13 NOTE — Telephone Encounter (Signed)
You saw him last for follow up however, he is a Chiropodist patient. Would you like me to put him on your schedule or Ashlyn's? Sam

## 2016-06-14 DIAGNOSIS — J1100 Influenza due to unidentified influenza virus with unspecified type of pneumonia: Secondary | ICD-10-CM | POA: Diagnosis not present

## 2016-06-14 DIAGNOSIS — K219 Gastro-esophageal reflux disease without esophagitis: Secondary | ICD-10-CM | POA: Diagnosis not present

## 2016-06-14 DIAGNOSIS — I252 Old myocardial infarction: Secondary | ICD-10-CM | POA: Diagnosis not present

## 2016-06-14 DIAGNOSIS — C3491 Malignant neoplasm of unspecified part of right bronchus or lung: Secondary | ICD-10-CM | POA: Diagnosis not present

## 2016-06-14 DIAGNOSIS — M069 Rheumatoid arthritis, unspecified: Secondary | ICD-10-CM | POA: Diagnosis not present

## 2016-06-14 DIAGNOSIS — I4891 Unspecified atrial fibrillation: Secondary | ICD-10-CM | POA: Diagnosis not present

## 2016-06-14 DIAGNOSIS — I25810 Atherosclerosis of coronary artery bypass graft(s) without angina pectoris: Secondary | ICD-10-CM | POA: Diagnosis not present

## 2016-06-14 DIAGNOSIS — I10 Essential (primary) hypertension: Secondary | ICD-10-CM | POA: Diagnosis not present

## 2016-06-14 DIAGNOSIS — F341 Dysthymic disorder: Secondary | ICD-10-CM | POA: Diagnosis not present

## 2016-06-15 DIAGNOSIS — K219 Gastro-esophageal reflux disease without esophagitis: Secondary | ICD-10-CM | POA: Diagnosis not present

## 2016-06-15 DIAGNOSIS — I25810 Atherosclerosis of coronary artery bypass graft(s) without angina pectoris: Secondary | ICD-10-CM | POA: Diagnosis not present

## 2016-06-15 DIAGNOSIS — F341 Dysthymic disorder: Secondary | ICD-10-CM | POA: Diagnosis not present

## 2016-06-15 DIAGNOSIS — C3491 Malignant neoplasm of unspecified part of right bronchus or lung: Secondary | ICD-10-CM | POA: Diagnosis not present

## 2016-06-15 DIAGNOSIS — M069 Rheumatoid arthritis, unspecified: Secondary | ICD-10-CM | POA: Diagnosis not present

## 2016-06-15 DIAGNOSIS — I4891 Unspecified atrial fibrillation: Secondary | ICD-10-CM | POA: Diagnosis not present

## 2016-06-15 DIAGNOSIS — I252 Old myocardial infarction: Secondary | ICD-10-CM | POA: Diagnosis not present

## 2016-06-15 DIAGNOSIS — J1100 Influenza due to unidentified influenza virus with unspecified type of pneumonia: Secondary | ICD-10-CM | POA: Diagnosis not present

## 2016-06-15 DIAGNOSIS — I10 Essential (primary) hypertension: Secondary | ICD-10-CM | POA: Diagnosis not present

## 2016-06-16 DIAGNOSIS — I252 Old myocardial infarction: Secondary | ICD-10-CM | POA: Diagnosis not present

## 2016-06-16 DIAGNOSIS — M069 Rheumatoid arthritis, unspecified: Secondary | ICD-10-CM | POA: Diagnosis not present

## 2016-06-16 DIAGNOSIS — I10 Essential (primary) hypertension: Secondary | ICD-10-CM | POA: Diagnosis not present

## 2016-06-16 DIAGNOSIS — J1100 Influenza due to unidentified influenza virus with unspecified type of pneumonia: Secondary | ICD-10-CM | POA: Diagnosis not present

## 2016-06-16 DIAGNOSIS — C3491 Malignant neoplasm of unspecified part of right bronchus or lung: Secondary | ICD-10-CM | POA: Diagnosis not present

## 2016-06-16 DIAGNOSIS — I4891 Unspecified atrial fibrillation: Secondary | ICD-10-CM | POA: Diagnosis not present

## 2016-06-16 DIAGNOSIS — F341 Dysthymic disorder: Secondary | ICD-10-CM | POA: Diagnosis not present

## 2016-06-16 DIAGNOSIS — I25810 Atherosclerosis of coronary artery bypass graft(s) without angina pectoris: Secondary | ICD-10-CM | POA: Diagnosis not present

## 2016-06-16 DIAGNOSIS — K219 Gastro-esophageal reflux disease without esophagitis: Secondary | ICD-10-CM | POA: Diagnosis not present

## 2016-06-21 DIAGNOSIS — J1100 Influenza due to unidentified influenza virus with unspecified type of pneumonia: Secondary | ICD-10-CM | POA: Diagnosis not present

## 2016-06-21 DIAGNOSIS — F341 Dysthymic disorder: Secondary | ICD-10-CM | POA: Diagnosis not present

## 2016-06-21 DIAGNOSIS — I4891 Unspecified atrial fibrillation: Secondary | ICD-10-CM | POA: Diagnosis not present

## 2016-06-21 DIAGNOSIS — I10 Essential (primary) hypertension: Secondary | ICD-10-CM | POA: Diagnosis not present

## 2016-06-21 DIAGNOSIS — C3491 Malignant neoplasm of unspecified part of right bronchus or lung: Secondary | ICD-10-CM | POA: Diagnosis not present

## 2016-06-21 DIAGNOSIS — I25810 Atherosclerosis of coronary artery bypass graft(s) without angina pectoris: Secondary | ICD-10-CM | POA: Diagnosis not present

## 2016-06-21 DIAGNOSIS — M069 Rheumatoid arthritis, unspecified: Secondary | ICD-10-CM | POA: Diagnosis not present

## 2016-06-21 DIAGNOSIS — I252 Old myocardial infarction: Secondary | ICD-10-CM | POA: Diagnosis not present

## 2016-06-21 DIAGNOSIS — K219 Gastro-esophageal reflux disease without esophagitis: Secondary | ICD-10-CM | POA: Diagnosis not present

## 2016-06-23 DIAGNOSIS — K219 Gastro-esophageal reflux disease without esophagitis: Secondary | ICD-10-CM | POA: Diagnosis not present

## 2016-06-23 DIAGNOSIS — J1100 Influenza due to unidentified influenza virus with unspecified type of pneumonia: Secondary | ICD-10-CM | POA: Diagnosis not present

## 2016-06-23 DIAGNOSIS — I252 Old myocardial infarction: Secondary | ICD-10-CM | POA: Diagnosis not present

## 2016-06-23 DIAGNOSIS — I10 Essential (primary) hypertension: Secondary | ICD-10-CM | POA: Diagnosis not present

## 2016-06-23 DIAGNOSIS — I25810 Atherosclerosis of coronary artery bypass graft(s) without angina pectoris: Secondary | ICD-10-CM | POA: Diagnosis not present

## 2016-06-23 DIAGNOSIS — M069 Rheumatoid arthritis, unspecified: Secondary | ICD-10-CM | POA: Diagnosis not present

## 2016-06-23 DIAGNOSIS — C3491 Malignant neoplasm of unspecified part of right bronchus or lung: Secondary | ICD-10-CM | POA: Diagnosis not present

## 2016-06-23 DIAGNOSIS — F341 Dysthymic disorder: Secondary | ICD-10-CM | POA: Diagnosis not present

## 2016-06-23 DIAGNOSIS — I4891 Unspecified atrial fibrillation: Secondary | ICD-10-CM | POA: Diagnosis not present

## 2016-06-24 DIAGNOSIS — I252 Old myocardial infarction: Secondary | ICD-10-CM | POA: Diagnosis not present

## 2016-06-24 DIAGNOSIS — I25810 Atherosclerosis of coronary artery bypass graft(s) without angina pectoris: Secondary | ICD-10-CM | POA: Diagnosis not present

## 2016-06-24 DIAGNOSIS — J1100 Influenza due to unidentified influenza virus with unspecified type of pneumonia: Secondary | ICD-10-CM | POA: Diagnosis not present

## 2016-06-24 DIAGNOSIS — K219 Gastro-esophageal reflux disease without esophagitis: Secondary | ICD-10-CM | POA: Diagnosis not present

## 2016-06-24 DIAGNOSIS — I4891 Unspecified atrial fibrillation: Secondary | ICD-10-CM | POA: Diagnosis not present

## 2016-06-24 DIAGNOSIS — F341 Dysthymic disorder: Secondary | ICD-10-CM | POA: Diagnosis not present

## 2016-06-24 DIAGNOSIS — C3491 Malignant neoplasm of unspecified part of right bronchus or lung: Secondary | ICD-10-CM | POA: Diagnosis not present

## 2016-06-24 DIAGNOSIS — I10 Essential (primary) hypertension: Secondary | ICD-10-CM | POA: Diagnosis not present

## 2016-06-24 DIAGNOSIS — M069 Rheumatoid arthritis, unspecified: Secondary | ICD-10-CM | POA: Diagnosis not present

## 2016-07-09 ENCOUNTER — Other Ambulatory Visit: Payer: Self-pay | Admitting: Family Medicine

## 2016-07-11 NOTE — Telephone Encounter (Signed)
Medication refilled per protocol. 

## 2016-07-18 ENCOUNTER — Other Ambulatory Visit: Payer: Self-pay | Admitting: Family Medicine

## 2016-07-18 ENCOUNTER — Other Ambulatory Visit: Payer: Self-pay | Admitting: Interventional Cardiology

## 2016-07-19 DIAGNOSIS — K219 Gastro-esophageal reflux disease without esophagitis: Secondary | ICD-10-CM | POA: Diagnosis not present

## 2016-07-19 DIAGNOSIS — I10 Essential (primary) hypertension: Secondary | ICD-10-CM | POA: Diagnosis not present

## 2016-07-19 DIAGNOSIS — M069 Rheumatoid arthritis, unspecified: Secondary | ICD-10-CM | POA: Diagnosis not present

## 2016-07-19 DIAGNOSIS — C3491 Malignant neoplasm of unspecified part of right bronchus or lung: Secondary | ICD-10-CM | POA: Diagnosis not present

## 2016-07-19 DIAGNOSIS — I25810 Atherosclerosis of coronary artery bypass graft(s) without angina pectoris: Secondary | ICD-10-CM | POA: Diagnosis not present

## 2016-07-19 DIAGNOSIS — J1100 Influenza due to unidentified influenza virus with unspecified type of pneumonia: Secondary | ICD-10-CM | POA: Diagnosis not present

## 2016-07-19 DIAGNOSIS — F341 Dysthymic disorder: Secondary | ICD-10-CM | POA: Diagnosis not present

## 2016-07-19 DIAGNOSIS — I4891 Unspecified atrial fibrillation: Secondary | ICD-10-CM | POA: Diagnosis not present

## 2016-07-19 DIAGNOSIS — I252 Old myocardial infarction: Secondary | ICD-10-CM | POA: Diagnosis not present

## 2016-07-28 DIAGNOSIS — R69 Illness, unspecified: Secondary | ICD-10-CM | POA: Diagnosis not present

## 2016-08-16 ENCOUNTER — Ambulatory Visit (HOSPITAL_BASED_OUTPATIENT_CLINIC_OR_DEPARTMENT_OTHER): Payer: Medicare HMO | Admitting: Internal Medicine

## 2016-08-16 ENCOUNTER — Ambulatory Visit (HOSPITAL_BASED_OUTPATIENT_CLINIC_OR_DEPARTMENT_OTHER): Payer: Medicare HMO

## 2016-08-16 ENCOUNTER — Ambulatory Visit: Payer: Commercial Managed Care - HMO | Admitting: Internal Medicine

## 2016-08-16 ENCOUNTER — Encounter: Payer: Self-pay | Admitting: Internal Medicine

## 2016-08-16 ENCOUNTER — Other Ambulatory Visit: Payer: Self-pay | Admitting: Medical Oncology

## 2016-08-16 ENCOUNTER — Other Ambulatory Visit: Payer: Commercial Managed Care - HMO

## 2016-08-16 ENCOUNTER — Telehealth: Payer: Self-pay | Admitting: Medical Oncology

## 2016-08-16 VITALS — BP 151/70 | HR 56 | Temp 97.7°F | Resp 18 | Ht 68.0 in | Wt 174.2 lb

## 2016-08-16 DIAGNOSIS — C3491 Malignant neoplasm of unspecified part of right bronchus or lung: Secondary | ICD-10-CM

## 2016-08-16 DIAGNOSIS — C3431 Malignant neoplasm of lower lobe, right bronchus or lung: Secondary | ICD-10-CM

## 2016-08-16 LAB — CBC WITH DIFFERENTIAL/PLATELET
BASO%: 0.2 % (ref 0.0–2.0)
Basophils Absolute: 0 10*3/uL (ref 0.0–0.1)
EOS%: 1.3 % (ref 0.0–7.0)
Eosinophils Absolute: 0.1 10*3/uL (ref 0.0–0.5)
HCT: 39.8 % (ref 38.4–49.9)
HGB: 13.4 g/dL (ref 13.0–17.1)
LYMPH%: 21.3 % (ref 14.0–49.0)
MCH: 31.1 pg (ref 27.2–33.4)
MCHC: 33.7 g/dL (ref 32.0–36.0)
MCV: 92.3 fL (ref 79.3–98.0)
MONO#: 1.2 10*3/uL — ABNORMAL HIGH (ref 0.1–0.9)
MONO%: 20.2 % — ABNORMAL HIGH (ref 0.0–14.0)
NEUT#: 3.5 10*3/uL (ref 1.5–6.5)
NEUT%: 57 % (ref 39.0–75.0)
Platelets: 81 10*3/uL — ABNORMAL LOW (ref 140–400)
RBC: 4.31 10*6/uL (ref 4.20–5.82)
RDW: 13.3 % (ref 11.0–14.6)
WBC: 6.1 10*3/uL (ref 4.0–10.3)
lymph#: 1.3 10*3/uL (ref 0.9–3.3)
nRBC: 0 % (ref 0–0)

## 2016-08-16 LAB — COMPREHENSIVE METABOLIC PANEL
ALT: 10 U/L (ref 0–55)
AST: 18 U/L (ref 5–34)
Albumin: 3.6 g/dL (ref 3.5–5.0)
Alkaline Phosphatase: 101 U/L (ref 40–150)
Anion Gap: 10 mEq/L (ref 3–11)
BUN: 14.6 mg/dL (ref 7.0–26.0)
CO2: 24 mEq/L (ref 22–29)
Calcium: 9 mg/dL (ref 8.4–10.4)
Chloride: 110 mEq/L — ABNORMAL HIGH (ref 98–109)
Creatinine: 1.2 mg/dL (ref 0.7–1.3)
EGFR: 56 mL/min/{1.73_m2} — ABNORMAL LOW (ref >90)
Glucose: 88 mg/dl (ref 70–140)
Potassium: 4.3 mEq/L (ref 3.5–5.1)
Sodium: 143 mEq/L (ref 136–145)
Total Bilirubin: 1.26 mg/dL — ABNORMAL HIGH (ref 0.20–1.20)
Total Protein: 6.7 g/dL (ref 6.4–8.3)

## 2016-08-16 NOTE — Progress Notes (Signed)
I gave appointment calendar to pts son and showed him pt appointment with Dr Tammi Klippel this month.

## 2016-08-16 NOTE — Telephone Encounter (Signed)
err

## 2016-08-16 NOTE — Progress Notes (Signed)
[place] [phone]   [phone]  OFFICE PROGRESS NOTE  [name], MD [address] [place] [address]  DIAGNOSIS: Stage IA (T1b, N0, M0) non-small cell lung cancer, adenocarcinoma presented with right lower lobe lung nodule but the patient also has some nonspecific bilateral hilar lymphadenopathy.These are most likely reactive but malignancy cannot be excluded at this point.  PRIOR THERAPY: Status post stereotactic radiotherapy under the care of Dr. Tammi Klippel completed on 02/22/2016.  CURRENT THERAPY: Observation  INTERVAL HISTORY: Evan [name] 83 y.o. male returns to the clinic today for follow-up visit accompanied by his son. The patient has no complaints today. He is feeling fine. He denied having any recent weight loss or night sweats. He is not complaining of any chest pain and he has significant improvement of his shortness of breath except with exertion. Has mild cough with no hemoptysis. He denied having any fever or chills. He has no nausea or vomiting. He had several imaging studies in February 2018 that showed radiation effect in the right lower lobe. He is here today for reevaluation. His son had several abusive comments on the answering service for the nurses few months ago. Security has been on alert during his visits. He was much nicer today and he apologized for his behavior few weeks ago.  MEDICAL HISTORY: Past Medical History:  Diagnosis Date  . Adenocarcinoma of right lung ([place]) 01/06/2016  . Anginal chest pain at rest [facility])    Chronic non, controlled on Lorazepam  . Anxiety   . Arthritis    "all over"   . BPH (benign prostatic hyperplasia)   . CAD (coronary artery disease)   . Chronic lower back pain   . Complication of anesthesia    "problems making water afterwards"  . Depression   . DJD (degenerative joint disease)   . Esophageal ulcer without bleeding    bid ppi indefinitely  . Family history of  adverse reaction to anesthesia    "children w/PONV"  . GERD (gastroesophageal reflux disease)   . Headache    "couple/week maybe" (09/04/2015)  . Hemochromatosis    Possible, elevated iron stores, hook-like osteophytes on hand films, normal LFTs  . Hepatitis    "yellow jaundice as a baby"  . History of ASCVD    MULTIVESSEL  . Hyperlipidemia   . Hypertension   . Myocardial infarction ([place]) 1999  . Osteoarthritis   . Pneumonia    "several times; got a little now" (09/04/2015)  . Rheumatoid arthritis (H[facility])     ALLERGIES:  is allergic to feldene [piroxicam]; statins; doxycycline; latex; tequin; and valium.  MEDICATIONS:  Current Outpatient Prescriptions  Medication Sig Dispense Refill  . atenolol (TENORMIN) 50 MG tablet TAKE 1 TABLET BY MOUTH  EVERY DAY 30 tablet 1  . Cholecalciferol 2000 units TABS Take 1 tablet (2,000 Units total) by mouth daily. (Patient taking differently: Take 1 tablet by mouth once a week. ) 30 each 11  . clopidogrel (PLAVIX) 75 MG tablet Take 1 tablet (75 mg total) by mouth daily. 90 tablet 2  . Cyanocobalamin (VITAMIN B 12 PO) Take 1 tablet by mouth daily.     . diclofenac sodium (VOLTAREN) 1 % GEL Apply 2 g topically daily as needed (for pain in knees and feet).    Marland Kitchen FLUoxetine (PROZAC) 20 MG capsule TAKE ONE CAPSULE BY MOUTH DAILY 90 capsule 1  . leflunomide (ARAVA) 10 MG tablet Take 10  mg by mouth daily.     Marland Kitchen LORazepam (ATIVAN) 1 MG tablet TAKE ONE (1) TABLET BY MOUTH 3 TIMES DAILY (TAKE AT EIGHT IN THE MORNING NOONAND FOUR PM) 90 tablet 2  . pantoprazole (PROTONIX) 40 MG tablet Take 1 tablet (40 mg total) by mouth daily. 90 tablet 2  . rosuvastatin (CRESTOR) 5 MG tablet TAKE 1 TABLET (5 MG TOTAL) BY MOUTH DAILY. 90 tablet 0  . saccharomyces boulardii (FLORASTOR) 250 MG capsule Take 1 capsule (250 mg total) by mouth 2 (two) times daily. 28 capsule 0  . Spacer/Aero-Holding Chambers (AEROCHAMBER PLUS WITH MASK) inhaler Use as instructed 1 each 2  . tamsulosin  (FLOMAX) 0.4 MG CAPS capsule TAKE 1 CAPSULE EVERY DAY 90 capsule 3  . traZODone (DESYREL) 50 MG tablet Take 50 mg by mouth at bedtime as needed for sleep.    Marland Kitchen albuterol (VENTOLIN HFA) 108 (90 Base) MCG/ACT inhaler Inhale 1-2 puffs into the lungs every 6 (six) hours as needed for wheezing or shortness of breath. (Patient not taking: Reported on 08/16/2016) 18 g 2  . HYDROcodone-acetaminophen (NORCO/VICODIN) 5-325 MG tablet Take 1 tablet by mouth as needed for pain.    . nitroGLYCERIN (NITROSTAT) 0.4 MG SL tablet Place 1 tablet (0.4 mg total) under the tongue every 5 (five) minutes as needed for chest pain. (Patient not taking: Reported on 08/16/2016) 25 tablet 0  . traMADol (ULTRAM) 50 MG tablet Take 1 tablet by mouth every 12 (twelve) hours as needed.      No current facility-administered medications for this visit.     SURGICAL HISTORY:  Past Surgical History:  Procedure Laterality Date  . ARM SKIN LESION BIOPSY / EXCISION Left 09/02/2015  . CATARACT EXTRACTION W/ INTRAOCULAR LENS  IMPLANT, BILATERAL Bilateral   . CHOLECYSTECTOMY OPEN    . CORNEAL TRANSPLANT Bilateral    "one at [facility]; [facility] at [facility]"  . CORONARY ANGIOPLASTY WITH STENT PLACEMENT    . CORONARY ARTERY BYPASS GRAFT  1999  . DESCENDING AORTIC ANEURYSM REPAIR W/ STENT     DE stent ostium into the right radial free graft at OM-, 02-2007  . ESOPHAGOGASTRODUODENOSCOPY N/A 11/09/2014   Procedure: ESOPHAGOGASTRODUODENOSCOPY (EGD);  Surgeon: [name], MD;  Location: [facility] ENDOSCOPY;  Service: Endoscopy;  Laterality: N/A;  . INGUINAL HERNIA REPAIR    . JOINT REPLACEMENT    . NASAL SINUS SURGERY    . SHOULDER OPEN ROTATOR CUFF REPAIR Right   . TOTAL KNEE ARTHROPLASTY Bilateral     REVIEW OF SYSTEMS:  A comprehensive review of systems was negative except for: Constitutional: positive for fatigue Respiratory: positive for dyspnea on exertion   PHYSICAL EXAMINATION: General appearance: alert, cooperative, fatigued and no distress Head:  Normocephalic, without obvious abnormality, atraumatic Neck: no adenopathy, no JVD, supple, symmetrical, trachea midline and thyroid not enlarged, symmetric, no tenderness/mass/nodules Lymph nodes: Cervical, supraclavicular, and axillary nodes normal. Resp: clear to auscultation bilaterally Back: symmetric, no curvature. ROM normal. No CVA tenderness. Cardio: regular rate and rhythm, S1, S2 normal, no murmur, click, rub or gallop GI: soft, non-tender; bowel sounds normal; no masses,  no organomegaly Extremities: extremities normal, atraumatic, no cyanosis or edema  ECOG PERFORMANCE STATUS: 1 - Symptomatic but completely ambulatory  Blood pressure (!) 151/70, pulse (!) 56, temperature 97.7 F (36.5 C), temperature source Oral, resp. rate 18, height '5\' 8"'$  (1.727 m), weight 174 lb 3.2 oz (79 kg), SpO2 98 %.  LABORATORY DATA: Lab Results  Component Value Date   WBC 6.5 05/26/2016  HGB 12.7 (L) 05/26/2016   HCT 38.6 05/26/2016   MCV 91.7 05/26/2016   PLT 91 (L) 05/26/2016      Chemistry      Component Value Date/Time   NA 136 05/26/2016 1028   NA 140 04/05/2016 1559   K 4.1 05/26/2016 1028   K 3.8 04/05/2016 1559   CL 108 05/26/2016 1028   CO2 18 (L) 05/26/2016 1028   CO2 21 (L) 04/05/2016 1559   BUN 17 05/26/2016 1028   BUN 15.0 04/05/2016 1559   CREATININE 1.05 05/26/2016 1028   CREATININE 1.1 04/05/2016 1559      Component Value Date/Time   CALCIUM 8.5 (L) 05/26/2016 1028   CALCIUM 9.2 04/05/2016 1559   ALKPHOS 69 05/26/2016 1028   AST 17 05/26/2016 1028   ALT 11 05/26/2016 1028   BILITOT 1.0 05/26/2016 1028       RADIOGRAPHIC STUDIES: No results found.  ASSESSMENT AND PLAN:  This is a very pleasant 83 years old white male with a stage IB non-small cell lung cancer presented with right lower lobe lung mass status post stereotactic radiotherapy under the care of Dr. Tammi Klippel. The patient has been on observation for the last few months. His last CT angiogram of the  chest in February 2018 showed no clear evidence for disease progression. I discussed the scan results and showed the images to the patient and his son. I recommended for him to continue on observation under the care of Dr. Tammi Klippel. He may need repeat CT scan of the chest before his visit with Dr. Tammi Klippel in the next 1-2 months. I would see the patient on as-needed basis if he has any evidence of disease progression or metastatic disease. The patient and his son indicated that they are not considering any systemic chemotherapy in the future anyway. The patient voices understanding of current disease status and treatment options and is in agreement with the current care plan. All questions were answered. The patient knows to call the clinic with any problems, questions or concerns. We can certainly see the patient much sooner if necessary.  Disclaimer: This note was dictated with voice recognition software. Similar sounding words can inadvertently be transcribed and may not be corrected upon review.

## 2016-08-17 ENCOUNTER — Other Ambulatory Visit: Payer: Self-pay | Admitting: Family Medicine

## 2016-08-17 NOTE — Telephone Encounter (Signed)
okay

## 2016-08-17 NOTE — Telephone Encounter (Signed)
Ok to refill 

## 2016-08-18 DIAGNOSIS — L298 Other pruritus: Secondary | ICD-10-CM | POA: Diagnosis not present

## 2016-08-19 ENCOUNTER — Telehealth: Payer: Self-pay | Admitting: Internal Medicine

## 2016-08-19 NOTE — Telephone Encounter (Signed)
Per 5/15 los. - No f/u appt need per MM

## 2016-08-23 ENCOUNTER — Ambulatory Visit (INDEPENDENT_AMBULATORY_CARE_PROVIDER_SITE_OTHER): Payer: Medicare HMO | Admitting: Family Medicine

## 2016-08-23 ENCOUNTER — Encounter: Payer: Self-pay | Admitting: Family Medicine

## 2016-08-23 VITALS — BP 100/60 | HR 64 | Temp 97.6°F | Resp 16 | Ht 68.0 in | Wt 171.0 lb

## 2016-08-23 DIAGNOSIS — R410 Disorientation, unspecified: Secondary | ICD-10-CM | POA: Diagnosis not present

## 2016-08-23 DIAGNOSIS — R404 Transient alteration of awareness: Secondary | ICD-10-CM

## 2016-08-23 DIAGNOSIS — R413 Other amnesia: Secondary | ICD-10-CM | POA: Diagnosis not present

## 2016-08-23 LAB — COMPLETE METABOLIC PANEL WITH GFR
ALT: 8 U/L — ABNORMAL LOW (ref 9–46)
AST: 17 U/L (ref 10–35)
Albumin: 3.9 g/dL (ref 3.6–5.1)
Alkaline Phosphatase: 95 U/L (ref 40–115)
BUN: 18 mg/dL (ref 7–25)
CO2: 20 mmol/L (ref 20–31)
Calcium: 9.2 mg/dL (ref 8.6–10.3)
Chloride: 106 mmol/L (ref 98–110)
Creat: 1.19 mg/dL — ABNORMAL HIGH (ref 0.70–1.11)
GFR, Est African American: 64 mL/min (ref >=60)
GFR, Est Non African American: 55 mL/min — ABNORMAL LOW (ref >=60)
Glucose, Bld: 94 mg/dL (ref 70–99)
Potassium: 4.2 mmol/L (ref 3.5–5.3)
Sodium: 139 mmol/L (ref 135–146)
Total Bilirubin: 1.2 mg/dL (ref 0.2–1.2)
Total Protein: 6.1 g/dL (ref 6.1–8.1)

## 2016-08-23 LAB — URINALYSIS, ROUTINE W REFLEX MICROSCOPIC
Bilirubin Urine: NEGATIVE
Glucose, UA: NEGATIVE
Hgb urine dipstick: NEGATIVE
Ketones, ur: NEGATIVE
Leukocytes, UA: NEGATIVE
Nitrite: NEGATIVE
Specific Gravity, Urine: 1.015 (ref 1.001–1.035)
pH: 6 (ref 5.0–8.0)

## 2016-08-23 NOTE — Progress Notes (Signed)
Subjective:    Patient ID: Evan [name], male    DOB: Jun 16, 1927, 83 y.o.   MRN: [id number]  HPI The patient presents with his son. Appraently over the weekend,  He became more confused. He never works on Sunday. However this Sunday, he began to work on his tractor and complained to his son to help him mow the yard.   This is out of character for this patient. Over the last few days, he has been constantly confused.  Son attributes this to gabapentin that was started by derm for itching.  He denies any fever, chills, cough, shortness of breath, abdominal pain, dysuria, nausea vomiting or diarrhea. He denies any falls or head injury. I performed a Mini-Mental status exam today. He was unable to tell me the day of the week although he did get the month and year correct. He was able to recall 0 out of 3 objects. He was unable to spell world backwards or perform serial sevens. He scored 22 out of 30 on his Mini-Mental status exam. He also had a difficult time following directions. However her neurologic exam is completely normal. Cranial nerves II through XII are grossly intact. Muscle strength is 5 over 5 equal and symmetric in the upper and lower extremities. Reflexes are normal. Cerebellar exam is normal. Urinalysis today shows no evidence of urinary tract infection Past Medical History:  Diagnosis Date  . Adenocarcinoma of right lung ([place]) 01/06/2016  . Anginal chest pain at rest [facility])    Chronic non, controlled on Lorazepam  . Anxiety   . Arthritis    "all over"   . BPH (benign prostatic hyperplasia)   . CAD (coronary artery disease)   . Chronic lower back pain   . Complication of anesthesia    "problems making water afterwards"  . Depression   . DJD (degenerative joint disease)   . Esophageal ulcer without bleeding    bid ppi indefinitely  . Family history of adverse reaction to anesthesia    "children w/PONV"  . GERD (gastroesophageal reflux disease)   . Headache    "couple/week maybe"  (09/04/2015)  . Hemochromatosis    Possible, elevated iron stores, hook-like osteophytes on hand films, normal LFTs  . Hepatitis    "yellow jaundice as a baby"  . History of ASCVD    MULTIVESSEL  . Hyperlipidemia   . Hypertension   . Myocardial infarction ([place]) 1999  . Osteoarthritis   . Pneumonia    "several times; got a little now" (09/04/2015)  . Rheumatoid arthritis [facility])    Past Surgical History:  Procedure Laterality Date  . ARM SKIN LESION BIOPSY / EXCISION Left 09/02/2015  . CATARACT EXTRACTION W/ INTRAOCULAR LENS  IMPLANT, BILATERAL Bilateral   . CHOLECYSTECTOMY OPEN    . CORNEAL TRANSPLANT Bilateral    "one at [facility]; one at [facility]"  . CORONARY ANGIOPLASTY WITH STENT PLACEMENT    . CORONARY ARTERY BYPASS GRAFT  1999  . DESCENDING AORTIC ANEURYSM REPAIR W/ STENT     DE stent ostium into the right radial free graft at OM-, 02-2007  . ESOPHAGOGASTRODUODENOSCOPY N/A 11/09/2014   Procedure: ESOPHAGOGASTRODUODENOSCOPY (EGD);  Surgeon: [name], MD;  Location: [facility] ENDOSCOPY;  Service: Endoscopy;  Laterality: N/A;  . INGUINAL HERNIA REPAIR    . JOINT REPLACEMENT    . NASAL SINUS SURGERY    . SHOULDER OPEN ROTATOR CUFF REPAIR Right   . TOTAL KNEE ARTHROPLASTY Bilateral    Current Outpatient Prescriptions on File Prior  to Visit  Medication Sig Dispense Refill  . atenolol (TENORMIN) 50 MG tablet TAKE 1 TABLET BY MOUTH  EVERY DAY 30 tablet 1  . Cholecalciferol 2000 units TABS Take 1 tablet (2,000 Units total) by mouth daily. 30 each 11  . clopidogrel (PLAVIX) 75 MG tablet Take 1 tablet (75 mg total) by mouth daily. 90 tablet 2  . Cyanocobalamin (VITAMIN B 12 PO) Take 1 tablet by mouth daily.     Marland Kitchen FLUoxetine (PROZAC) 20 MG capsule TAKE ONE CAPSULE BY MOUTH DAILY 90 capsule 1  . leflunomide (ARAVA) 10 MG tablet Take 10 mg by mouth daily.     Marland Kitchen LORazepam (ATIVAN) 1 MG tablet TAKE ONE (1) TABLET BY MOUTH 3 TIMES DAILY (TAKE AT EIGHT IN THE MORNING NOONAND FOUR PM) 90 tablet 1  .  nitroGLYCERIN (NITROSTAT) 0.4 MG SL tablet Place 1 tablet (0.4 mg total) under the tongue every 5 (five) minutes as needed for chest pain. 25 tablet 0  . pantoprazole (PROTONIX) 40 MG tablet Take 1 tablet (40 mg total) by mouth daily. 90 tablet 2  . rosuvastatin (CRESTOR) 5 MG tablet TAKE 1 TABLET (5 MG TOTAL) BY MOUTH DAILY. 90 tablet 0  . Spacer/Aero-Holding Chambers (AEROCHAMBER PLUS WITH MASK) inhaler Use as instructed 1 each 2  . tamsulosin (FLOMAX) 0.4 MG CAPS capsule TAKE 1 CAPSULE EVERY DAY 90 capsule 3  . traMADol (ULTRAM) 50 MG tablet Take 1 tablet by mouth every 12 (twelve) hours as needed.     Marland Kitchen albuterol (VENTOLIN HFA) 108 (90 Base) MCG/ACT inhaler Inhale 1-2 puffs into the lungs every 6 (six) hours as needed for wheezing or shortness of breath. (Patient not taking: Reported on 08/16/2016) 18 g 2  . HYDROcodone-acetaminophen (NORCO/VICODIN) 5-325 MG tablet Take 1 tablet by mouth as needed for pain.    . traZODone (DESYREL) 50 MG tablet Take 50 mg by mouth at bedtime as needed for sleep.     No current facility-administered medications on file prior to visit.    Allergies  Allergen Reactions  . Feldene [Piroxicam] Other (See Comments)    REACTION: Blisters  . Statins Other (See Comments)    Myalgias  . Doxycycline Other (See Comments)    REACTION:  unknown  . Latex Rash  . Tequin Anxiety  . Valium Other (See Comments)    REACTION:  unknown   Social History   Social History  . Marital status: Widowed    Spouse name: N/A  . Number of children: N/A  . Years of education: N/A   Occupational History  . Not on file.   Social History Main Topics  . Smoking status: Never Smoker  . Smokeless tobacco: Former Systems developer    Types: Chew     Comment: "quit chewing in the 1960s"  . Alcohol use No  . Drug use: No  . Sexual activity: No   Other Topics Concern  . Not on file   Social History Narrative  . No narrative on file     Review of Systems  All other systems reviewed  and are negative.      Objective:   Physical Exam  Constitutional: He is oriented to person, place, and time. He appears well-developed and well-nourished. No distress.  Eyes: Conjunctivae and EOM are normal. Pupils are equal, round, and reactive to light. No scleral icterus.  Neck: Neck supple. No JVD present.  Cardiovascular: Normal rate, regular rhythm and normal heart sounds.   No murmur heard. Pulmonary/Chest: Effort normal  and breath sounds normal. No respiratory distress. He has no wheezes. He has no rales.  Abdominal: Soft. Bowel sounds are normal.  Lymphadenopathy:    He has no cervical adenopathy.  Neurological: He is alert and oriented to person, place, and time. He has normal reflexes. He displays normal reflexes. No cranial nerve deficit. He exhibits normal muscle tone. Coordination normal.  Skin: He is not diaphoretic.  Psychiatric: He has a normal mood and affect. His speech is normal and behavior is normal. Judgment and thought content normal. Cognition and memory are impaired.  Vitals reviewed.         Assessment & Plan:  Transient alteration of awareness - Plan: CBC with Differential/Platelet, COMPLETE METABOLIC PANEL WITH GFR, Urinalysis, Routine w reflex microscopic, MR Brain W Wo Contrast  Confusion - Plan: MR Brain W Wo Contrast  Memory loss - Plan: MR Brain W Wo Contrast  I suspect that the patient likely has some mild underlying dementia that could have been made worse by medication and polypharmacy. I have recommended discontinuation of gabapentin. He has been on lorazepam 1 mg 3 times a day for many many years. Therefore, we are and unable to abruptly discontinue this medication due to potential withdrawal. However I did recommend decreasing the medication to one half tablet 3 times a day and avoiding the use of tramadol. Schedule the patient for an MRI of the brain to evaluate for a mini stroke although there is no gross evidence of a major stroke on his  exam. Rule out urinary tract infection with urinalysis. Check CBC to evaluate for leukocytosis or other signs of serious illness. Check CMP to evaluate for elevated liver function test or acute renal insufficiency or electrolyte disturbances

## 2016-08-24 ENCOUNTER — Other Ambulatory Visit: Payer: Self-pay | Admitting: Family Medicine

## 2016-08-24 ENCOUNTER — Encounter: Payer: Self-pay | Admitting: Family Medicine

## 2016-08-24 ENCOUNTER — Ambulatory Visit (INDEPENDENT_AMBULATORY_CARE_PROVIDER_SITE_OTHER): Payer: Medicare HMO | Admitting: Family Medicine

## 2016-08-24 VITALS — BP 140/78 | HR 68 | Temp 97.6°F | Resp 16

## 2016-08-24 DIAGNOSIS — R410 Disorientation, unspecified: Secondary | ICD-10-CM

## 2016-08-24 DIAGNOSIS — R404 Transient alteration of awareness: Secondary | ICD-10-CM

## 2016-08-24 DIAGNOSIS — W19XXXA Unspecified fall, initial encounter: Secondary | ICD-10-CM | POA: Diagnosis not present

## 2016-08-24 DIAGNOSIS — R413 Other amnesia: Secondary | ICD-10-CM

## 2016-08-24 DIAGNOSIS — S0181XA Laceration without foreign body of other part of head, initial encounter: Secondary | ICD-10-CM | POA: Diagnosis not present

## 2016-08-24 DIAGNOSIS — S61412A Laceration without foreign body of left hand, initial encounter: Secondary | ICD-10-CM

## 2016-08-24 DIAGNOSIS — T148XXA Other injury of unspecified body region, initial encounter: Secondary | ICD-10-CM

## 2016-08-24 LAB — CBC WITH DIFFERENTIAL/PLATELET
Basophils Absolute: 0 cells/uL (ref 0–200)
Basophils Relative: 0 %
Eosinophils Absolute: 0 cells/uL — ABNORMAL LOW (ref 15–500)
Eosinophils Relative: 0 %
HCT: 38.5 % (ref 38.5–50.0)
Hemoglobin: 12.9 g/dL — ABNORMAL LOW (ref 13.0–17.0)
Lymphocytes Relative: 18 %
Lymphs Abs: 1206 cells/uL (ref 850–3900)
MCH: 30.3 pg (ref 27.0–33.0)
MCHC: 33.5 g/dL (ref 32.0–36.0)
MCV: 90.4 fL (ref 80.0–100.0)
Monocytes Absolute: 1273 cells/uL — ABNORMAL HIGH (ref 200–950)
Monocytes Relative: 19 %
Neutro Abs: 4221 cells/uL (ref 1500–7800)
Neutrophils Relative %: 63 %
RBC: 4.26 MIL/uL (ref 4.20–5.80)
RDW: 13.6 % (ref 11.0–15.0)
WBC: 6.7 10*3/uL (ref 3.8–10.8)

## 2016-08-24 MED ORDER — HYDROCODONE-ACETAMINOPHEN 5-325 MG PO TABS: 1.0000 | ORAL_TABLET | ORAL | 0 refills | Status: DC | PRN

## 2016-08-24 MED ORDER — CEPHALEXIN 500 MG PO CAPS: 500.0000 mg | ORAL_CAPSULE | Freq: Three times a day (TID) | ORAL | 0 refills | Status: DC

## 2016-08-24 NOTE — Progress Notes (Signed)
Subjective:    Patient ID: Evan [name], male    DOB: 07/23/1927, 83 y.o.   MRN: [id number]  Patient presents for S/P Fall (hit head on brick steps)   Pt here with acute fall. He was with his son, walking up the steps carrying food in one hand, did not hold onto rail and tripped on brick stairs, he fell and scraped himself up. No LOC, no headache, no vomiting, no pain anywhere Bleeding from forehead and knee, he is on Plavix  No difficulty breathibng, no CP Son states he was fine until he tripped, noted he was seen yesterday for confusion episodes MRI ordered States he is only taking tylenol for paion  Review Of Systems:  GEN- denies fatigue, fever, weight loss,weakness, recent illness HEENT- denies eye drainage, change in vision, nasal discharge, CVS- denies chest pain, palpitations RESP- denies SOB, cough, wheeze ABD- denies N/V, change in stools, abd pain GU- denies dysuria, hematuria, dribbling, incontinence MSK- denies joint pain, muscle aches, injury Neuro- denies headache, dizziness, syncope, seizure activity       Objective:    BP 140/78   Pulse 68   Temp 97.6 F (36.4 C) (Oral)   Resp 16  GEN- NAD, alert and oriented x HEENT- PERRL, EOMI, non injected sclera, pink conjunctiva, MMM, oropharynx clear,ecchymosis beneath left eye, NT over facial bones, skin tear beneath lower lid, unable to suture, above left brow jagged 2-3cm laceration, smaller linear laceration 1.5cm on left forehead all NT  Neck- Supple, fair ROM CVS- RRR, no murmur RESP-CTAB ABD-NABS,soft,NT,ND Ext- Right knee- 2 small skin tears, Left hand base of 3rd digit 2-3 laceration, large skin tear over knuckles, 0.5inch skin tear lateral aspect of hand, small skin tear ulnar wrist, ;Left knee- abrasion Brusing left hand Neuro-CNII-XII intact, slow gait but non antalgic, normal speech Pulses- Radial 2+  Procedure-  Multiple laceration repair  Procedure explained to patient questions answered benefits  and risks discussed verbal consent obtained. Anesthesia-lidocaine 1% 4-0 Ethilon - 5 single interrupted sutures on laceartion above brow and 2 on linear lac on forehead.  3-0 Ethilon to Hand laceration single interrupted sutures Patient tolerated procedure well Bandage applied         Assessment & Plan:      Problem List Items Addressed This Visit    None    Visit Diagnoses    Laceration of multiple sites of face    -  Primary   S/P suturing of jagged lac above left brow, also with liner lac on forehead, steri stip to skin tears. 1 tear beneath eye could not be sutured    Laceration of left hand without foreign body, initial encounter       Sutured to laceration at base of 3rd digit on left hand, others could not be sutured skin tears, tegaderm to large one over knukles, steri strips to smaller one   Multiple skin tears       Cleaned at bedside, bandaging applied to right knee as well TDAP UTD, cover with Keflex had debri in wounds.    Fall, initial encounter       Loss footing, noted he has MRI scheduled for in AM, no new neurological deficits, no HA, no speech changes today, no LOC. Given 20 TABS OF nORCO TO USE FOR PAIN, IF tylenol does not does help. He states he was not taking anything else. Discussed with son as well, to give only for severe pain. He did let me know they were  trying to get him off narcotics. Recheck Tuesday to remove facial sutures, hand may need to stay longer    Recommeded that someone stay with him to night to watch for any new signs or neurological changes.        Note: This dictation was prepared with Dragon dictation along with smaller phrase technology. Any transcriptional errors that result from this process are unintentional.

## 2016-08-24 NOTE — Patient Instructions (Signed)
F/U Tuesday for recheck and suture removal

## 2016-08-25 ENCOUNTER — Ambulatory Visit
Admission: RE | Admit: 2016-08-25 | Discharge: 2016-08-25 | Disposition: A | Discharge status: Home / Self Care | Payer: Commercial Managed Care - HMO | Source: Ambulatory Visit | Attending: Urology | Admitting: Urology

## 2016-08-25 ENCOUNTER — Encounter: Payer: Self-pay | Admitting: *Deleted

## 2016-08-25 ENCOUNTER — Other Ambulatory Visit: Payer: Commercial Managed Care - HMO

## 2016-08-25 NOTE — Progress Notes (Signed)
72 Called spoke with wife reported her husband fell yesterday had multiple laceration repair by his PCP Dr. Vic Blackbird.  See epic office visit note 08-24-16. Will re-schedule follow up appointment will he is able to come in for his appointment.

## 2016-08-27 ENCOUNTER — Ambulatory Visit (HOSPITAL_COMMUNITY): Payer: Commercial Managed Care - HMO

## 2016-08-30 ENCOUNTER — Encounter: Payer: Self-pay | Admitting: Family Medicine

## 2016-08-30 ENCOUNTER — Ambulatory Visit (INDEPENDENT_AMBULATORY_CARE_PROVIDER_SITE_OTHER): Payer: Medicare HMO | Admitting: Family Medicine

## 2016-08-30 VITALS — BP 150/70 | HR 60 | Temp 97.4°F | Resp 20 | Ht 68.0 in | Wt 172.0 lb

## 2016-08-30 DIAGNOSIS — R410 Disorientation, unspecified: Secondary | ICD-10-CM

## 2016-08-30 DIAGNOSIS — J189 Pneumonia, unspecified organism: Secondary | ICD-10-CM

## 2016-08-30 DIAGNOSIS — S61412A Laceration without foreign body of left hand, initial encounter: Secondary | ICD-10-CM

## 2016-08-30 DIAGNOSIS — J181 Lobar pneumonia, unspecified organism: Secondary | ICD-10-CM

## 2016-08-30 DIAGNOSIS — S0181XA Laceration without foreign body of other part of head, initial encounter: Secondary | ICD-10-CM

## 2016-08-30 DIAGNOSIS — T148XXA Other injury of unspecified body region, initial encounter: Secondary | ICD-10-CM

## 2016-08-30 NOTE — Progress Notes (Signed)
Subjective:    Patient ID: Evan [name], male    DOB: Jun 22, 1927, 83 y.o.   MRN: [id number]  HPI  08/23/16 The patient presents with his son. Appraently over the weekend,  He became more confused. He never works on Sunday. However this Sunday, he began to work on his tractor and complained to his son to help him mow the yard.   This is out of character for this patient. Over the last few days, he has been constantly confused.  Son attributes this to gabapentin that was started by derm for itching.  He denies any fever, chills, cough, shortness of breath, abdominal pain, dysuria, nausea vomiting or diarrhea. He denies any falls or head injury. I performed a Mini-Mental status exam today. He was unable to tell me the day of the week although he did get the month and year correct. He was able to recall 0 out of 3 objects. He was unable to spell world backwards or perform serial sevens. He scored 22 out of 30 on his Mini-Mental status exam. He also had a difficult time following directions. However her neurologic exam is completely normal. Cranial nerves II through XII are grossly intact. Muscle strength is 5 over 5 equal and symmetric in the upper and lower extremities. Reflexes are normal. Cerebellar exam is normal. Urinalysis today shows no evidence of urinary tract infection.  At that time, my plan was: I suspect that the patient likely has some mild underlying dementia that could have been made worse by medication and polypharmacy. I have recommended discontinuation of gabapentin. He has been on lorazepam 1 mg 3 times a day for many many years. Therefore, we are and unable to abruptly discontinue this medication due to potential withdrawal. However I did recommend decreasing the medication to one half tablet 3 times a day and avoiding the use of tramadol. Schedule the patient for an MRI of the brain to evaluate for a mini stroke although there is no gross evidence of a major stroke on his exam. Rule out  urinary tract infection with urinalysis. Check CBC to evaluate for leukocytosis or other signs of serious illness. Check CMP to evaluate for elevated liver function test or acute renal insufficiency or electrolyte disturbances  08/30/16 The following day, he fell sustaining a laceration above his left eye, skin tear below his left eye, skin tear to the dorsum of his left hand and laceration between 2-3 mcp joints.  Family cancelled MRI.  Labs were unremarkable.  Since discontinuing gabapentin and reducing ativan, his mentation has cleared.  Previously, he ws unable to even tell me the year.  Today, he quickly answers 2018 and is able to climb onto the exam table without assistance.  However, he is asking to increase ativan again.  Stitches above left eye are not ready to be removed.  I removed one and skin edges separated slightly.  I then reinforced skin edges above eye with steri strips.  No eveidence of cellulitis.   Past Medical History:  Diagnosis Date  . Adenocarcinoma of right lung ([place]) 01/06/2016  . Anginal chest pain at rest [facility])    Chronic non, controlled on Lorazepam  . Anxiety   . Arthritis    "all over"   . BPH (benign prostatic hyperplasia)   . CAD (coronary artery disease)   . Chronic lower back pain   . Complication of anesthesia    "problems making water afterwards"  . Depression   . DJD (degenerative joint disease)   .  Esophageal ulcer without bleeding    bid ppi indefinitely  . Family history of adverse reaction to anesthesia    "children w/PONV"  . GERD (gastroesophageal reflux disease)   . Headache    "couple/week maybe" (09/04/2015)  . Hemochromatosis    Possible, elevated iron stores, hook-like osteophytes on hand films, normal LFTs  . Hepatitis    "yellow jaundice as a baby"  . History of ASCVD    MULTIVESSEL  . Hyperlipidemia   . Hypertension   . Myocardial infarction ([place]) 1999  . Osteoarthritis   . Pneumonia    "several times; got a little now" (09/04/2015)   . Rheumatoid arthritis [facility])    Past Surgical History:  Procedure Laterality Date  . ARM SKIN LESION BIOPSY / EXCISION Left 09/02/2015  . CATARACT EXTRACTION W/ INTRAOCULAR LENS  IMPLANT, BILATERAL Bilateral   . CHOLECYSTECTOMY OPEN    . CORNEAL TRANSPLANT Bilateral    "one at [facility]; one at [facility]"  . CORONARY ANGIOPLASTY WITH STENT PLACEMENT    . CORONARY ARTERY BYPASS GRAFT  1999  . DESCENDING AORTIC ANEURYSM REPAIR W/ STENT     DE stent ostium into the right radial free graft at OM-, 02-2007  . ESOPHAGOGASTRODUODENOSCOPY N/A 11/09/2014   Procedure: ESOPHAGOGASTRODUODENOSCOPY (EGD);  Surgeon: [name], MD;  Location: [facility] ENDOSCOPY;  Service: Endoscopy;  Laterality: N/A;  . INGUINAL HERNIA REPAIR    . JOINT REPLACEMENT    . NASAL SINUS SURGERY    . SHOULDER OPEN ROTATOR CUFF REPAIR Right   . TOTAL KNEE ARTHROPLASTY Bilateral    Current Outpatient Prescriptions on File Prior to Visit  Medication Sig Dispense Refill  . atenolol (TENORMIN) 50 MG tablet TAKE 1 TABLET BY MOUTH  EVERY DAY 30 tablet 1  . cephALEXin (KEFLEX) 500 MG capsule Take 1 capsule (500 mg total) by mouth 3 (three) times daily. 21 capsule 0  . cetirizine (ZYRTEC) 10 MG tablet Take 10 mg by mouth daily.    . Cholecalciferol 2000 units TABS Take 1 tablet (2,000 Units total) by mouth daily. 30 each 11  . clopidogrel (PLAVIX) 75 MG tablet Take 1 tablet (75 mg total) by mouth daily. 90 tablet 2  . Cyanocobalamin (VITAMIN B 12 PO) Take 1 tablet by mouth daily.     Marland Kitchen FLUoxetine (PROZAC) 20 MG capsule TAKE ONE CAPSULE BY MOUTH DAILY 90 capsule 1  . folic acid (FOLVITE) 762 MCG tablet Take 400 mcg by mouth daily.    Marland Kitchen HYDROcodone-acetaminophen (NORCO/VICODIN) 5-325 MG tablet Take 1 tablet by mouth as needed. 20 tablet 0  . leflunomide (ARAVA) 10 MG tablet Take 10 mg by mouth daily.     Marland Kitchen LORazepam (ATIVAN) 1 MG tablet TAKE ONE (1) TABLET BY MOUTH 3 TIMES DAILY (TAKE AT EIGHT IN THE MORNING NOONAND FOUR PM) 90 tablet 1  .  nitroGLYCERIN (NITROSTAT) 0.4 MG SL tablet Place 1 tablet (0.4 mg total) under the tongue every 5 (five) minutes as needed for chest pain. 25 tablet 0  . pantoprazole (PROTONIX) 40 MG tablet Take 1 tablet (40 mg total) by mouth daily. 90 tablet 2  . rosuvastatin (CRESTOR) 5 MG tablet TAKE 1 TABLET (5 MG TOTAL) BY MOUTH DAILY. 90 tablet 0  . Spacer/Aero-Holding Chambers (AEROCHAMBER PLUS WITH MASK) inhaler Use as instructed 1 each 2  . tamsulosin (FLOMAX) 0.4 MG CAPS capsule TAKE 1 CAPSULE EVERY DAY 90 capsule 3  . traMADol (ULTRAM) 50 MG tablet Take 1 tablet by mouth every 12 (twelve) hours as  needed.     . traZODone (DESYREL) 50 MG tablet Take 50 mg by mouth at bedtime as needed for sleep.    Marland Kitchen albuterol (VENTOLIN HFA) 108 (90 Base) MCG/ACT inhaler Inhale 1-2 puffs into the lungs every 6 (six) hours as needed for wheezing or shortness of breath. (Patient not taking: Reported on 08/30/2016) 18 g 2   No current facility-administered medications on file prior to visit.    Allergies  Allergen Reactions  . Feldene [Piroxicam] Other (See Comments)    REACTION: Blisters  . Statins Other (See Comments)    Myalgias  . Doxycycline Other (See Comments)    REACTION:  unknown  . Latex Rash  . Tequin Anxiety  . Valium Other (See Comments)    REACTION:  unknown   Social History   Social History  . Marital status: Widowed    Spouse name: N/A  . Number of children: N/A  . Years of education: N/A   Occupational History  . Not on file.   Social History Main Topics  . Smoking status: Never Smoker  . Smokeless tobacco: Former Systems developer    Types: Chew     Comment: "quit chewing in the 1960s"  . Alcohol use No  . Drug use: No  . Sexual activity: No   Other Topics Concern  . Not on file   Social History Narrative  . No narrative on file     Review of Systems  All other systems reviewed and are negative.      Objective:   Physical Exam  Constitutional: He is oriented to person, place,  and time. He appears well-developed and well-nourished. No distress.  Eyes: Conjunctivae and EOM are normal. Pupils are equal, round, and reactive to light. No scleral icterus.  Neck: Neck supple. No JVD present.  Cardiovascular: Normal rate, regular rhythm and normal heart sounds.   No murmur heard. Pulmonary/Chest: Effort normal and breath sounds normal. No respiratory distress. He has no wheezes. He has no rales.  Abdominal: Soft. Bowel sounds are normal.  Lymphadenopathy:    He has no cervical adenopathy.  Neurological: He is alert and oriented to person, place, and time. He has normal reflexes. No cranial nerve deficit. He exhibits normal muscle tone. Coordination normal.  Skin: He is not diaphoretic.  Psychiatric: He has a normal mood and affect. His speech is normal and behavior is normal. Judgment and thought content normal. Cognition and memory are impaired.  Vitals reviewed.  Mental status much improved.  Balance better       Assessment & Plan:  Laceration of multiple sites of face  Laceration of left hand without foreign body, initial encounter  Multiple skin tears  Pneumonia of right lower lobe due to infectious organism ([place])  Confusion  I believe confusion was due to polypharmacy on top of mild memory impairment.  Recommended he NOT increase ativan back to baseline and try to take it as infrequently as possible.  Stay off gabapentin.  Stitches not yet ready for removal.  Return Friday to remove stiches.

## 2016-09-02 ENCOUNTER — Encounter: Payer: Self-pay | Admitting: Family Medicine

## 2016-09-02 ENCOUNTER — Ambulatory Visit (INDEPENDENT_AMBULATORY_CARE_PROVIDER_SITE_OTHER): Payer: Medicare HMO | Admitting: Family Medicine

## 2016-09-02 VITALS — BP 124/72 | HR 74 | Temp 97.5°F | Resp 16 | Ht 68.0 in | Wt 168.0 lb

## 2016-09-02 DIAGNOSIS — Z4802 Encounter for removal of sutures: Secondary | ICD-10-CM

## 2016-09-02 NOTE — Progress Notes (Signed)
Subjective:    Patient ID: Evan [name], male    DOB: 01-13-1928, 83 y.o.   MRN: [id number]  HPI  08/23/16 The patient presents with his son. Appraently over the weekend,  He became more confused. He never works on Sunday. However this Sunday, he began to work on his tractor and complained to his son to help him mow the yard.   This is out of character for this patient. Over the last few days, he has been constantly confused.  Son attributes this to gabapentin that was started by derm for itching.  He denies any fever, chills, cough, shortness of breath, abdominal pain, dysuria, nausea vomiting or diarrhea. He denies any falls or head injury. I performed a Mini-Mental status exam today. He was unable to tell me the day of the week although he did get the month and year correct. He was able to recall 0 out of 3 objects. He was unable to spell world backwards or perform serial sevens. He scored 22 out of 30 on his Mini-Mental status exam. He also had a difficult time following directions. However her neurologic exam is completely normal. Cranial nerves II through XII are grossly intact. Muscle strength is 5 over 5 equal and symmetric in the upper and lower extremities. Reflexes are normal. Cerebellar exam is normal. Urinalysis today shows no evidence of urinary tract infection.  At that time, my plan was: I suspect that the patient likely has some mild underlying dementia that could have been made worse by medication and polypharmacy. I have recommended discontinuation of gabapentin. He has been on lorazepam 1 mg 3 times a day for many many years. Therefore, we are and unable to abruptly discontinue this medication due to potential withdrawal. However I did recommend decreasing the medication to one half tablet 3 times a day and avoiding the use of tramadol. Schedule the patient for an MRI of the brain to evaluate for a mini stroke although there is no gross evidence of a major stroke on his exam. Rule out  urinary tract infection with urinalysis. Check CBC to evaluate for leukocytosis or other signs of serious illness. Check CMP to evaluate for elevated liver function test or acute renal insufficiency or electrolyte disturbances  08/30/16 The following day, he fell sustaining a laceration above his left eye, skin tear below his left eye, skin tear to the dorsum of his left hand and laceration between 2-3 mcp joints.  Family cancelled MRI.  Labs were unremarkable.  Since discontinuing gabapentin and reducing ativan, his mentation has cleared.  Previously, he ws unable to even tell me the year.  Today, he quickly answers 2018 and is able to climb onto the exam table without assistance.  However, he is asking to increase ativan again.  Stitches above left eye are not ready to be removed.  I removed one and skin edges separated slightly.  I then reinforced skin edges above eye with steri strips.  No eveidence of cellulitis.  At that time, my plan was:  I believe confusion was due to polypharmacy on top of mild memory impairment.  Recommended he NOT increase ativan back to baseline and try to take it as infrequently as possible.  Stay off gabapentin.  Stitches not yet ready for removal.  Return Friday to remove stiches.    09/02/16 Here today for suture removal. I removed the 5 remaining sutures from above his left eye without difficulty and 4 sutures from the wound on the dorsum of  his left hand between the second and third fingers.  I reinforced this wound with Steri-Strips. The wounds healed nicely with no evidence of cellulitis. He again wants to increase his dose of ativan back to 1 tablet 3 times a day because he is "shaking."  However as I watch the patient, the shaking seems to come and go and seems to be volitional. It seems to be anxiety related. I certainly believe he could be experiencing some mild withdrawal from ativan given the duration of use however I'm extremely concerned about his altered mental  status and risk of falling on high-risk medication such as this. Past Medical History:  Diagnosis Date  . Adenocarcinoma of right lung ([place]) 01/06/2016  . Anginal chest pain at rest [facility])    Chronic non, controlled on Lorazepam  . Anxiety   . Arthritis    "all over"   . BPH (benign prostatic hyperplasia)   . CAD (coronary artery disease)   . Chronic lower back pain   . Complication of anesthesia    "problems making water afterwards"  . Depression   . DJD (degenerative joint disease)   . Esophageal ulcer without bleeding    bid ppi indefinitely  . Family history of adverse reaction to anesthesia    "children w/PONV"  . GERD (gastroesophageal reflux disease)   . Headache    "couple/week maybe" (09/04/2015)  . Hemochromatosis    Possible, elevated iron stores, hook-like osteophytes on hand films, normal LFTs  . Hepatitis    "yellow jaundice as a baby"  . History of ASCVD    MULTIVESSEL  . Hyperlipidemia   . Hypertension   . Myocardial infarction ([place]) 1999  . Osteoarthritis   . Pneumonia    "several times; got a little now" (09/04/2015)  . Rheumatoid arthritis [facility])    Past Surgical History:  Procedure Laterality Date  . ARM SKIN LESION BIOPSY / EXCISION Left 09/02/2015  . CATARACT EXTRACTION W/ INTRAOCULAR LENS  IMPLANT, BILATERAL Bilateral   . CHOLECYSTECTOMY OPEN    . CORNEAL TRANSPLANT Bilateral    "one at [facility]; one at [facility]"  . CORONARY ANGIOPLASTY WITH STENT PLACEMENT    . CORONARY ARTERY BYPASS GRAFT  1999  . DESCENDING AORTIC ANEURYSM REPAIR W/ STENT     DE stent ostium into the right radial free graft at OM-, 02-2007  . ESOPHAGOGASTRODUODENOSCOPY N/A 11/09/2014   Procedure: ESOPHAGOGASTRODUODENOSCOPY (EGD);  Surgeon: [name], MD;  Location: [facility] ENDOSCOPY;  Service: Endoscopy;  Laterality: N/A;  . INGUINAL HERNIA REPAIR    . JOINT REPLACEMENT    . NASAL SINUS SURGERY    . SHOULDER OPEN ROTATOR CUFF REPAIR Right   . TOTAL KNEE ARTHROPLASTY Bilateral    Current  Outpatient Prescriptions on File Prior to Visit  Medication Sig Dispense Refill  . albuterol (VENTOLIN HFA) 108 (90 Base) MCG/ACT inhaler Inhale 1-2 puffs into the lungs every 6 (six) hours as needed for wheezing or shortness of breath. 18 g 2  . atenolol (TENORMIN) 50 MG tablet TAKE 1 TABLET BY MOUTH  EVERY DAY 30 tablet 1  . cetirizine (ZYRTEC) 10 MG tablet Take 10 mg by mouth daily.    . Cholecalciferol 2000 units TABS Take 1 tablet (2,000 Units total) by mouth daily. 30 each 11  . clopidogrel (PLAVIX) 75 MG tablet Take 1 tablet (75 mg total) by mouth daily. 90 tablet 2  . Cyanocobalamin (VITAMIN B 12 PO) Take 1 tablet by mouth daily.     Marland Kitchen FLUoxetine (  PROZAC) 20 MG capsule TAKE ONE CAPSULE BY MOUTH DAILY 90 capsule 1  . folic acid (FOLVITE) 161 MCG tablet Take 400 mcg by mouth daily.    Marland Kitchen HYDROcodone-acetaminophen (NORCO/VICODIN) 5-325 MG tablet Take 1 tablet by mouth as needed. 20 tablet 0  . leflunomide (ARAVA) 10 MG tablet Take 10 mg by mouth daily.     Marland Kitchen LORazepam (ATIVAN) 1 MG tablet TAKE ONE (1) TABLET BY MOUTH 3 TIMES DAILY (TAKE AT EIGHT IN THE MORNING NOONAND FOUR PM) 90 tablet 1  . nitroGLYCERIN (NITROSTAT) 0.4 MG SL tablet Place 1 tablet (0.4 mg total) under the tongue every 5 (five) minutes as needed for chest pain. 25 tablet 0  . pantoprazole (PROTONIX) 40 MG tablet Take 1 tablet (40 mg total) by mouth daily. 90 tablet 2  . rosuvastatin (CRESTOR) 5 MG tablet TAKE 1 TABLET (5 MG TOTAL) BY MOUTH DAILY. 90 tablet 0  . Spacer/Aero-Holding Chambers (AEROCHAMBER PLUS WITH MASK) inhaler Use as instructed 1 each 2  . tamsulosin (FLOMAX) 0.4 MG CAPS capsule TAKE 1 CAPSULE EVERY DAY 90 capsule 3  . traMADol (ULTRAM) 50 MG tablet Take 1 tablet by mouth every 12 (twelve) hours as needed.     . traZODone (DESYREL) 50 MG tablet Take 50 mg by mouth at bedtime as needed for sleep.     No current facility-administered medications on file prior to visit.    Allergies  Allergen Reactions  .  Feldene [Piroxicam] Other (See Comments)    REACTION: Blisters  . Statins Other (See Comments)    Myalgias  . Doxycycline Other (See Comments)    REACTION:  unknown  . Latex Rash  . Tequin Anxiety  . Valium Other (See Comments)    REACTION:  unknown   Social History   Social History  . Marital status: Widowed    Spouse name: N/A  . Number of children: N/A  . Years of education: N/A   Occupational History  . Not on file.   Social History Main Topics  . Smoking status: Never Smoker  . Smokeless tobacco: Former Systems developer    Types: Chew     Comment: "quit chewing in the 1960s"  . Alcohol use No  . Drug use: No  . Sexual activity: No   Other Topics Concern  . Not on file   Social History Narrative  . No narrative on file     Review of Systems  All other systems reviewed and are negative.      Objective:   Physical Exam  Constitutional: He is oriented to person, place, and time. He appears well-developed and well-nourished. No distress.  Eyes: Conjunctivae and EOM are normal. Pupils are equal, round, and reactive to light. No scleral icterus.  Neck: Neck supple. No JVD present.  Cardiovascular: Normal rate, regular rhythm and normal heart sounds.   No murmur heard. Pulmonary/Chest: Effort normal and breath sounds normal. No respiratory distress. He has no wheezes. He has no rales.  Abdominal: Soft. Bowel sounds are normal.  Lymphadenopathy:    He has no cervical adenopathy.  Neurological: He is alert and oriented to person, place, and time. He has normal reflexes. No cranial nerve deficit. He exhibits normal muscle tone. Coordination normal.  Skin: He is not diaphoretic.  Psychiatric: He has a normal mood and affect. His speech is normal and behavior is normal. Judgment and thought content normal. Cognition and memory are impaired.  Vitals reviewed.  Mental status much improved.  Balance better  Assessment & Plan:  Visit for suture removal Sutures removed  from above the left eye and the dorsum of the left hand without difficulty. Mentation is much better. Altered mental status appears to be secondary to polypharmacy coupled with mild dementia. Recommended against increasing the dose of Ativan back to 1 mg 3 times a day. Continue 1/2 mg 3 times a day for the time being. I will continue to try to gradually wean down on this in the future. I believe the majority of the patient's "shaking" is secondary to anxiety and withdrawal. However I believe the risk of the medication outweighs its benefit.

## 2016-09-05 ENCOUNTER — Telehealth: Payer: Self-pay | Admitting: *Deleted

## 2016-09-05 NOTE — Telephone Encounter (Signed)
Spoke with Tillie Rung from [facility] radiology who requested we call [employer] and change location of MRI from [facility] to [facility]. Gave their tax id # to [facility] ([id number]) and had location changed to [facility]. Authorization  Number for this procedure is #[id number] given to me by Morey Hummingbird at [facility].

## 2016-09-06 ENCOUNTER — Ambulatory Visit (HOSPITAL_COMMUNITY)
Admission: RE | Admit: 2016-09-06 | Discharge: 2016-09-06 | Disposition: A | Discharge status: Home / Self Care | Payer: Medicare HMO | Source: Ambulatory Visit | Attending: Family Medicine | Admitting: Family Medicine

## 2016-09-06 ENCOUNTER — Telehealth: Payer: Self-pay | Admitting: Family Medicine

## 2016-09-06 DIAGNOSIS — R404 Transient alteration of awareness: Secondary | ICD-10-CM

## 2016-09-06 DIAGNOSIS — R4182 Altered mental status, unspecified: Secondary | ICD-10-CM | POA: Diagnosis not present

## 2016-09-06 DIAGNOSIS — R413 Other amnesia: Secondary | ICD-10-CM | POA: Diagnosis not present

## 2016-09-06 DIAGNOSIS — R410 Disorientation, unspecified: Secondary | ICD-10-CM | POA: Diagnosis not present

## 2016-09-06 DIAGNOSIS — S065X9A Traumatic subdural hemorrhage with loss of consciousness of unspecified duration, initial encounter: Secondary | ICD-10-CM

## 2016-09-06 DIAGNOSIS — I6200 Nontraumatic subdural hemorrhage, unspecified: Secondary | ICD-10-CM | POA: Insufficient documentation

## 2016-09-06 DIAGNOSIS — S065XAA Traumatic subdural hemorrhage with loss of consciousness status unknown, initial encounter: Secondary | ICD-10-CM

## 2016-09-06 MED ORDER — GADOBENATE DIMEGLUMINE 529 MG/ML IV SOLN
15.0000 mL | Freq: Once | INTRAVENOUS | Status: AC | PRN
  Administered 2016-09-06: 15 mL via INTRAVENOUS

## 2016-09-06 NOTE — Telephone Encounter (Signed)
I called pt son Evan [name] three times, no answer Called pt son Evan [name] no answer I spoke to patient given results of small subdural hematoma, told to stop his Plavix Stated he would call his daughter to help him He gave me number for Evan [name] but this did not go through.  We will try again in the morning No mass effect on scan Get appt with Neurosurgeon to follow this He has fallen so many times unclear if this is acute or subacute finding

## 2016-09-07 ENCOUNTER — Encounter: Payer: Self-pay | Admitting: Family Medicine

## 2016-09-07 ENCOUNTER — Telehealth: Payer: Self-pay | Admitting: *Deleted

## 2016-09-07 ENCOUNTER — Other Ambulatory Visit: Payer: Self-pay | Admitting: *Deleted

## 2016-09-07 ENCOUNTER — Other Ambulatory Visit: Payer: Self-pay | Admitting: Family Medicine

## 2016-09-07 DIAGNOSIS — S065XAA Traumatic subdural hemorrhage with loss of consciousness status unknown, initial encounter: Secondary | ICD-10-CM

## 2016-09-07 DIAGNOSIS — S065X9A Traumatic subdural hemorrhage with loss of consciousness of unspecified duration, initial encounter: Secondary | ICD-10-CM

## 2016-09-07 NOTE — Telephone Encounter (Signed)
I spoke with pt daughter last night [name] and she was given info

## 2016-09-07 NOTE — Telephone Encounter (Signed)
[facility] phoned and doctor we referred patient to would like [place] to order a Head CT without contrast and will see patient next week.

## 2016-09-07 NOTE — Telephone Encounter (Signed)
Noted, okay to order

## 2016-09-07 NOTE — Telephone Encounter (Signed)
Daughter informed us pt came in from outside feeling weak, looking pale and BP was 105/41. Instructed daughter to get some fluids in him, preferably water and see if he gets to feeling better. If no better in 1-2 hours call ems. Dr. Buelah Manis is aware of conversation.  Informed daughter of CT of head on Friday 6/8 at 2:15 pm at [facility].

## 2016-09-08 ENCOUNTER — Telehealth: Payer: Self-pay | Admitting: Interventional Cardiology

## 2016-09-08 NOTE — Telephone Encounter (Signed)
Attempted to call daughter back. There was no answer and the mailbox was full. Unable to leave voicemail.

## 2016-09-08 NOTE — Telephone Encounter (Signed)
Patient's daughter calling (DPR on file) and states that her father fell a week ago and was having memory issues. She states that he was seen by his PCP and an MRI of his brain was ordered and showed some bleeding. She states that the patient's PCP instructed him to stop taking his plavix and that he has been off since yesterday. She states that he is scheduled to see the neurologist next Thursday. Daughter wanting to know how long he can safely be off of the plavix. Message sent to Dr. Irish Lack for review.

## 2016-09-08 NOTE — Telephone Encounter (Signed)
Called and made daughter aware that Dr. Irish Lack was okay with her father being off of the plavix until he sees the neurologist next Thursday. Daughter verbalized understanding and appreciated the call.

## 2016-09-08 NOTE — Telephone Encounter (Signed)
New Message   pt daughter verbalized that she is calling for rn   Pt had an MRI and he has bleeding on the brain and   daughter wants to talk about his options and his medicaitons

## 2016-09-09 ENCOUNTER — Ambulatory Visit (HOSPITAL_COMMUNITY)
Admission: RE | Admit: 2016-09-09 | Discharge: 2016-09-09 | Disposition: A | Discharge status: Home / Self Care | Payer: Medicare HMO | Source: Ambulatory Visit | Attending: Family Medicine | Admitting: Family Medicine

## 2016-09-09 DIAGNOSIS — I6200 Nontraumatic subdural hemorrhage, unspecified: Secondary | ICD-10-CM | POA: Diagnosis not present

## 2016-09-09 DIAGNOSIS — S065X0A Traumatic subdural hemorrhage without loss of consciousness, initial encounter: Secondary | ICD-10-CM | POA: Diagnosis not present

## 2016-09-09 DIAGNOSIS — S065X9A Traumatic subdural hemorrhage with loss of consciousness of unspecified duration, initial encounter: Secondary | ICD-10-CM

## 2016-09-09 DIAGNOSIS — S065XAA Traumatic subdural hemorrhage with loss of consciousness status unknown, initial encounter: Secondary | ICD-10-CM

## 2016-09-10 ENCOUNTER — Encounter (HOSPITAL_COMMUNITY): Payer: Self-pay | Admitting: *Deleted

## 2016-09-10 ENCOUNTER — Emergency Department (HOSPITAL_COMMUNITY)
Admission: EM | Admit: 2016-09-10 | Discharge: 2016-09-11 | Disposition: A | Discharge status: Home / Self Care | Payer: Medicare HMO | Attending: Emergency Medicine | Admitting: Emergency Medicine

## 2016-09-10 ENCOUNTER — Emergency Department (HOSPITAL_COMMUNITY): Payer: Medicare HMO

## 2016-09-10 DIAGNOSIS — Z96653 Presence of artificial knee joint, bilateral: Secondary | ICD-10-CM | POA: Diagnosis not present

## 2016-09-10 DIAGNOSIS — I10 Essential (primary) hypertension: Secondary | ICD-10-CM | POA: Diagnosis not present

## 2016-09-10 DIAGNOSIS — J45909 Unspecified asthma, uncomplicated: Secondary | ICD-10-CM | POA: Insufficient documentation

## 2016-09-10 DIAGNOSIS — J181 Lobar pneumonia, unspecified organism: Secondary | ICD-10-CM | POA: Diagnosis not present

## 2016-09-10 DIAGNOSIS — C3491 Malignant neoplasm of unspecified part of right bronchus or lung: Secondary | ICD-10-CM | POA: Diagnosis not present

## 2016-09-10 DIAGNOSIS — I2510 Atherosclerotic heart disease of native coronary artery without angina pectoris: Secondary | ICD-10-CM | POA: Insufficient documentation

## 2016-09-10 DIAGNOSIS — Z951 Presence of aortocoronary bypass graft: Secondary | ICD-10-CM | POA: Insufficient documentation

## 2016-09-10 DIAGNOSIS — J189 Pneumonia, unspecified organism: Secondary | ICD-10-CM

## 2016-09-10 DIAGNOSIS — R531 Weakness: Secondary | ICD-10-CM | POA: Diagnosis not present

## 2016-09-10 DIAGNOSIS — Z7902 Long term (current) use of antithrombotics/antiplatelets: Secondary | ICD-10-CM | POA: Insufficient documentation

## 2016-09-10 DIAGNOSIS — Z79899 Other long term (current) drug therapy: Secondary | ICD-10-CM | POA: Diagnosis not present

## 2016-09-10 DIAGNOSIS — Z91040 Latex allergy status: Secondary | ICD-10-CM | POA: Insufficient documentation

## 2016-09-10 DIAGNOSIS — Z87891 Personal history of nicotine dependence: Secondary | ICD-10-CM | POA: Insufficient documentation

## 2016-09-10 DIAGNOSIS — R0602 Shortness of breath: Secondary | ICD-10-CM | POA: Diagnosis not present

## 2016-09-10 LAB — BASIC METABOLIC PANEL
Anion gap: 9 (ref 5–15)
BUN: 19 mg/dL (ref 6–20)
CO2: 18 mmol/L — ABNORMAL LOW (ref 22–32)
Calcium: 8.9 mg/dL (ref 8.9–10.3)
Chloride: 106 mmol/L (ref 101–111)
Creatinine, Ser: 0.98 mg/dL (ref 0.61–1.24)
GFR calc Af Amer: >60 mL/min (ref >60)
GFR calc non Af Amer: >60 mL/min (ref >60)
Glucose, Bld: 139 mg/dL — ABNORMAL HIGH (ref 65–99)
Potassium: 4.1 mmol/L (ref 3.5–5.1)
Sodium: 133 mmol/L — ABNORMAL LOW (ref 135–145)

## 2016-09-10 LAB — URINALYSIS, ROUTINE W REFLEX MICROSCOPIC
Bilirubin Urine: NEGATIVE
Glucose, UA: NEGATIVE mg/dL
Hgb urine dipstick: NEGATIVE
Ketones, ur: NEGATIVE mg/dL
Leukocytes, UA: NEGATIVE
Nitrite: NEGATIVE
Protein, ur: NEGATIVE mg/dL
Specific Gravity, Urine: 1.01 (ref 1.005–1.030)
pH: 6 (ref 5.0–8.0)

## 2016-09-10 LAB — HEPATIC FUNCTION PANEL
ALT: 11 U/L — ABNORMAL LOW (ref 17–63)
AST: 22 U/L (ref 15–41)
Albumin: 3.4 g/dL — ABNORMAL LOW (ref 3.5–5.0)
Alkaline Phosphatase: 93 U/L (ref 38–126)
Bilirubin, Direct: 0.2 mg/dL (ref 0.1–0.5)
Indirect Bilirubin: 1 mg/dL — ABNORMAL HIGH (ref 0.3–0.9)
Total Bilirubin: 1.2 mg/dL (ref 0.3–1.2)
Total Protein: 6.1 g/dL — ABNORMAL LOW (ref 6.5–8.1)

## 2016-09-10 LAB — CBC
HCT: 38.6 % — ABNORMAL LOW (ref 39.0–52.0)
Hemoglobin: 12.7 g/dL — ABNORMAL LOW (ref 13.0–17.0)
MCH: 30 pg (ref 26.0–34.0)
MCHC: 32.9 g/dL (ref 30.0–36.0)
MCV: 91.3 fL (ref 78.0–100.0)
Platelets: 90 10*3/uL — ABNORMAL LOW (ref 150–400)
RBC: 4.23 MIL/uL (ref 4.22–5.81)
RDW: 13.8 % (ref 11.5–15.5)
WBC: 7.3 10*3/uL (ref 4.0–10.5)

## 2016-09-10 LAB — I-STAT TROPONIN, ED: Troponin i, poc: 0.03 ng/mL (ref 0.00–0.08)

## 2016-09-10 MED ORDER — AZITHROMYCIN 250 MG PO TABS: 250.0000 mg | ORAL_TABLET | Freq: Every day | ORAL | 0 refills | Status: DC

## 2016-09-10 MED ORDER — DEXTROSE 5 % IV SOLN
500.0000 mg | Freq: Once | INTRAVENOUS | Status: AC
  Administered 2016-09-10: 500 mg via INTRAVENOUS
  Filled 2016-09-10 (×2): qty 500

## 2016-09-10 MED ORDER — CEFTRIAXONE SODIUM 1 G IJ SOLR
1.0000 g | Freq: Once | INTRAMUSCULAR | Status: AC
  Administered 2016-09-10: 1 g via INTRAVENOUS
  Filled 2016-09-10: qty 10

## 2016-09-10 MED ORDER — HYDROXYZINE HCL 10 MG PO TABS
10.0000 mg | ORAL_TABLET | Freq: Once | ORAL | Status: AC
  Administered 2016-09-10: 10 mg via ORAL
  Filled 2016-09-10: qty 1

## 2016-09-10 NOTE — ED Triage Notes (Signed)
Pt and family member reports that pt had recent MRI due to unsteady gait and falling. Was told he had subdural hematoma. Pt had repeat ct scan done yesterday. Family reports pt having tremors, shaking, fatigue, lack of appetite that has gotten worse since yesterday.

## 2016-09-10 NOTE — ED Provider Notes (Signed)
[facility] DEPT Provider Note   CSN: [id number] Arrival date & time: 09/10/16  1451     History   Chief Complaint Chief Complaint  Patient presents with  . Weakness    HPI PER BEAGLEY is a 83 y.o. male.  HPI   83 year old man who presents today stating that he has had some gait unsteadiness and falling recently. MRI done as outpatient and patient told that he had subdural hematoma. Increased fatigue, shaking, and poor appetite. Daughter states that symptoms began after he was treated with medicine for chronic itching by a dermatologist. He was treated with gabapentin. He became very weak and unsteady. His primary care doctor stopped the gabapentin. However he fell going up is that during this period of time. He improved somewhat but then continued to have weakness. His primary care doctor obtained an MRI as an outpatient. Discussed that showed a subdural hematoma. He had a repeat CT scan yesterday that showed improvement of the subdural. His primary care doctor told the family and he appeared worse and he should be seen to make sure that the bleeding was not worsened. He presents today with some increased generalized weakness. Past Medical History:  Diagnosis Date  . Adenocarcinoma of right lung ([place]) 01/06/2016  . Anginal chest pain at rest [facility])    Chronic non, controlled on Lorazepam  . Anxiety   . Arthritis    "all over"   . BPH (benign prostatic hyperplasia)   . CAD (coronary artery disease)   . Chronic lower back pain   . Complication of anesthesia    "problems making water afterwards"  . Depression   . DJD (degenerative joint disease)   . Esophageal ulcer without bleeding    bid ppi indefinitely  . Family history of adverse reaction to anesthesia    "children w/PONV"  . GERD (gastroesophageal reflux disease)   . Headache    "couple/week maybe" (09/04/2015)  . Hemochromatosis    Possible, elevated iron stores, hook-like osteophytes on hand films, normal LFTs  .  Hepatitis    "yellow jaundice as a baby"  . History of ASCVD    MULTIVESSEL  . Hyperlipidemia   . Hypertension   . Myocardial infarction ([place]) 1999  . Osteoarthritis   . Pneumonia    "several times; got a little now" (09/04/2015)  . Rheumatoid arthritis ([place])   . Subdural hematoma (HCC)    small after fall 09/2016-plavix held, neurosurgery consulted.  Asymptomatic.      Patient Active Problem List   Diagnosis Date Noted  . Community acquired pneumonia of right lower lobe of lung ([place])   . Influenza with pneumonia 05/21/2016  . Atrial fibrillation ([place]) 05/21/2016  . SIRS (systemic inflammatory response syndrome) (HCC)   . Adenocarcinoma of right lung ([place]) 01/06/2016  . GAD (generalized anxiety disorder) 11/20/2015  . Essential hypertension   . Pain in the chest   . Chest pain   . Cellulitis 09/04/2015  . Cellulitis of left axilla   . Esophageal ulcer without bleeding   . Bilateral lower extremity edema 04/28/2014  . Coronary atherosclerosis of native coronary artery 09/09/2013  . BPH (benign prostatic hyperplasia) 10/08/2012  . Anxiety   . EKG abnormalities 09/05/2012  . Tremor 09/05/2012  . Chronic fatigue 09/05/2012  . Arthritis   . Asthma, chronic   . Reflux esophagitis   . S/P CABG (coronary artery bypass graft)   . GERD (gastroesophageal reflux disease)   . Hyperlipidemia   . Hypertension   .  UNSPECIFIED PERIPHERAL VASCULAR DISEASE 06/16/2009    Past Surgical History:  Procedure Laterality Date  . ARM SKIN LESION BIOPSY / EXCISION Left 09/02/2015  . CATARACT EXTRACTION W/ INTRAOCULAR LENS  IMPLANT, BILATERAL Bilateral   . CHOLECYSTECTOMY OPEN    . CORNEAL TRANSPLANT Bilateral    "one at [facility]; one at [facility]"  . CORONARY ANGIOPLASTY WITH STENT PLACEMENT    . CORONARY ARTERY BYPASS GRAFT  1999  . DESCENDING AORTIC ANEURYSM REPAIR W/ STENT     DE stent ostium into the right radial free graft at OM-, 02-2007  . ESOPHAGOGASTRODUODENOSCOPY N/A 11/09/2014   Procedure:  ESOPHAGOGASTRODUODENOSCOPY (EGD);  Surgeon: [name], MD;  Location: [facility] ENDOSCOPY;  Service: Endoscopy;  Laterality: N/A;  . INGUINAL HERNIA REPAIR    . JOINT REPLACEMENT    . NASAL SINUS SURGERY    . SHOULDER OPEN ROTATOR CUFF REPAIR Right   . TOTAL KNEE ARTHROPLASTY Bilateral        Home Medications    Prior to Admission medications   Medication Sig Start Date End Date Taking? Authorizing Provider  acetaminophen (TYLENOL) 500 MG tablet Take 500 mg by mouth every 6 (six) hours as needed for headache (pain).   Yes [provider]  amoxicillin (AMOXIL) 500 MG capsule Take 2,000 mg by mouth See admin instructions. Take 4 capsules (2000 mg) by mouth one hour prior to dental appointments   Yes [provider]  atenolol (TENORMIN) 50 MG tablet TAKE 1 TABLET BY MOUTH  EVERY DAY Patient taking differently: Take 25-50 mg by mouth See admin instructions. Take 1 tablet (50 mg) by mouth every morning, reduce to 1/2 tablet (25 mg) if dyastolic blood pressure is <49 11/20/15  Yes M[place], Modena Nunnery, MD  Cholecalciferol 2000 units TABS Take 1 tablet (2,000 Units total) by mouth daily. Patient taking differently: Take 1 tablet by mouth daily. Vitamin D3 02/04/16  Yes [name], MD  Cyanocobalamin (VITAMIN B-12) 2500 MCG SUBL Place 2,500 mcg under the tongue daily.   Yes [provider]  FLUoxetine (PROZAC) 20 MG capsule TAKE ONE CAPSULE BY MOUTH DAILY 07/11/16  Yes [name], MD  leflunomide (ARAVA) 10 MG tablet Take 10 mg by mouth daily.  08/01/15  Yes [provider]  Lifitegrast Shirley Friar) 5 % SOLN Place 1-2 drops into both eyes daily.   Yes [provider]  LORazepam (ATIVAN) 1 MG tablet TAKE ONE (1) TABLET BY MOUTH 3 TIMES DAILY (TAKE AT EIGHT IN THE MORNING NOONAND FOUR PM) Patient taking differently: TAKE ONE TABLET (1 MG) BY MOUTH EVERY MORNING AND AT NIGHT, TAKE 1/2-1 TABLET (0.5 - 1 mg) AT LUNCH 08/17/16  Yes Pickard, Cammie Mcgee, MD    nitroGLYCERIN (NITROSTAT) 0.4 MG SL tablet Place 1 tablet (0.4 mg total) under the tongue every 5 (five) minutes as needed for chest pain. 07/14/15  Yes Jettie Booze, MD  pantoprazole (PROTONIX) 40 MG tablet Take 1 tablet (40 mg total) by mouth daily. 11/02/15  Yes Jettie Booze, MD  rosuvastatin (CRESTOR) 5 MG tablet TAKE 1 TABLET (5 MG TOTAL) BY MOUTH DAILY. 07/20/16  Yes Jettie Booze, MD  tamsulosin (FLOMAX) 0.4 MG CAPS capsule TAKE 1 CAPSULE EVERY DAY 07/18/16  Yes [name], MD  albuterol (VENTOLIN HFA) 108 (90 Base) MCG/ACT inhaler Inhale 1-2 puffs into the lungs every 6 (six) hours as needed for wheezing or shortness of breath. Patient not taking: Reported on 09/10/2016 05/26/16   [name], MD  clopidogrel (PLAVIX) 75 MG tablet Take 1 tablet (75 mg total) by mouth daily. 11/02/15   Jettie Booze, MD  HYDROcodone-acetaminophen (NORCO/VICODIN) 5-325 MG tablet Take 1 tablet by mouth as needed. Patient not taking: Reported on 09/10/2016 08/24/16   Alycia Rossetti, MD  Spacer/Aero-Holding Chambers (AEROCHAMBER PLUS WITH MASK) inhaler Use as instructed Patient not taking: Reported on 09/10/2016 10/19/15   Charlesetta Shanks, MD    Family History Family History  Problem Relation Age of Onset  . Arthritis-Osteo Sister   . Heart attack Brother   . Heart attack Other   . Cancer Neg Hx     Social History Social History  Substance Use Topics  . Smoking status: Never Smoker  . Smokeless tobacco: Former Systems developer    Types: Chew     Comment: "quit chewing in the 1960s"  . Alcohol use No     Allergies   Feldene [piroxicam]; Statins; Doxycycline; Latex; Tequin; and Valium   Review of Systems Review of Systems   Physical Exam Updated Vital Signs BP 102/84   Pulse 65   Temp 100.1 F (37.8 C) (Rectal)   Resp 17   SpO2 96%   Physical Exam  Constitutional: He is oriented to person, place, and time. He appears well-developed and well-nourished.  HENT:   Head: Normocephalic and atraumatic.  Right Ear: External ear normal.  Left Ear: External ear normal.  Nose: Nose normal.  Mouth/Throat: Oropharynx is clear and moist.  Eyes: EOM are normal. Pupils are equal, round, and reactive to light.  Neck: Normal range of motion. Neck supple.  Cardiovascular: An irregularly irregular rhythm present.  Pulmonary/Chest: Effort normal and breath sounds normal.  Abdominal: Soft. Bowel sounds are normal.  Musculoskeletal: Normal range of motion.  Neurological: He is alert and oriented to person, place, and time. He displays normal reflexes. No cranial nerve deficit. He exhibits normal muscle tone. Coordination normal.  Skin: Skin is warm. Capillary refill takes less than 2 seconds.  Nursing note and vitals reviewed.    ED Treatments / Results  Labs (all labs ordered are listed, but only abnormal results are displayed) Labs Reviewed  BASIC METABOLIC PANEL - Abnormal; Notable for the following:       Result Value   Sodium 133 (*)    CO2 18 (*)    Glucose, Bld 139 (*)    All other components within normal limits  CBC - Abnormal; Notable for the following:    Hemoglobin 12.7 (*)    HCT 38.6 (*)    Platelets 90 (*)    All other components within normal limits  URINALYSIS, ROUTINE W REFLEX MICROSCOPIC  HEPATIC FUNCTION PANEL  I-STAT TROPOININ, ED    EKG  EKG Interpretation  Date/Time:  Saturday September 10 2016 15:11:19 EDT Ventricular Rate:  73 PR Interval:    QRS Duration: 92 QT Interval:  402 QTC Calculation: 442 R Axis:   -10 Text Interpretation:  Atrial fibrillation Left ventricular hypertrophy Cannot rule out Septal infarct , age undetermined ST & T wave abnormality, consider inferolateral ischemia Abnormal ECG please repeat Confirmed by Alveda Vanhorne MD, Andee Poles [phone]) on 09/10/2016 4:15:44 PM       Radiology Dg Chest 2 View  Result Date: 09/10/2016 CLINICAL DATA:  Anxiety.  Slight shortness of breath. EXAM: CHEST  2 VIEW COMPARISON:   05/20/2016 and chest CTA dated 05/21/2016. FINDINGS: Stable mildly enlarged cardiac silhouette and post CABG changes. Mild right basilar airspace opacity. Diffuse osteopenia. Breathing motion blurring on the lateral  view. Cholecystectomy clips. IMPRESSION: Mild right basilar patchy atelectasis or pneumonia. Electronically Signed   By: Claudie Revering M.D.   On: 09/10/2016 17:20   Ct Head Wo Contrast  Result Date: 09/09/2016 CLINICAL DATA:  F/u small subdural hematoma from 09/06/16 from fall 2 weeks ago when she hit his left side forehead on cement steps EXAM: CT HEAD WITHOUT CONTRAST TECHNIQUE: Contiguous axial images were obtained from the base of the skull through the vertex without intravenous contrast. COMPARISON:  MR brain 09/06/2016 FINDINGS: Brain: No evidence of acute infarction, hemorrhage, extra-axial collection, ventriculomegaly, or mass effect. Generalized cerebral atrophy. Periventricular white matter low attenuation likely secondary to microangiopathy. Vascular: Cerebrovascular atherosclerotic calcifications are noted. Skull: Negative for fracture or focal lesion. Sinuses/Orbits: Visualized portions of the orbits are unremarkable. Visualized portions of the paranasal sinuses and mastoid air cells are unremarkable. Other: None. IMPRESSION: 1. No acute intracranial pathology. 2. Interval resolution of small left parafalcine subdural hematoma. Electronically Signed   By: Kathreen Devoid   On: 09/09/2016 14:38    Procedures Procedures (including critical care time)  Medications Ordered in ED Medications - No data to display   Initial Impression / Assessment and Plan / ED Course  I have reviewed the triage vital signs and the nursing notes.  Pertinent labs & imaging results that were available during my care of the patient were reviewed by me and considered in my medical decision making (see chart for details).    Patient with low-grade temperature of 100.1 here. He has an elevated white blood  cell count 12,000 and small patchy right lower lobe infiltrate. Patient is treated here with Rocephin and Zithromax. I discussed the benefits of hospitalization versus outpatient treatment. He lives with his daughter and her calculated be best served with attempting outpatient treatment. He'll be discharged home on Zithromax as he is allergic to teQuinn and doxycycline. I have discussed with family to make sure that he is taking plain fluids and return if he appears worse anytime specifically was unable to tolerate his oral medicines becomes dyspneic or appears more ill.   Final Clinical Impressions(s) / ED Diagnoses   Final diagnoses:  Community acquired pneumonia of right lower lobe of lung ([place])    New Prescriptions New Prescriptions   No medications on file     Pattricia Boss, MD 09/10/16 2250

## 2016-09-13 ENCOUNTER — Other Ambulatory Visit: Payer: Self-pay | Admitting: Family Medicine

## 2016-09-13 DIAGNOSIS — S065X9A Traumatic subdural hemorrhage with loss of consciousness of unspecified duration, initial encounter: Secondary | ICD-10-CM | POA: Diagnosis not present

## 2016-09-13 DIAGNOSIS — S065XAA Traumatic subdural hemorrhage with loss of consciousness status unknown, initial encounter: Secondary | ICD-10-CM | POA: Insufficient documentation

## 2016-09-15 ENCOUNTER — Encounter: Payer: Self-pay | Admitting: Family Medicine

## 2016-09-15 ENCOUNTER — Ambulatory Visit (INDEPENDENT_AMBULATORY_CARE_PROVIDER_SITE_OTHER): Payer: Medicare HMO | Admitting: Family Medicine

## 2016-09-15 VITALS — BP 138/74 | HR 84 | Temp 97.5°F | Resp 14 | Ht 68.0 in | Wt 170.0 lb

## 2016-09-15 DIAGNOSIS — R531 Weakness: Secondary | ICD-10-CM | POA: Diagnosis not present

## 2016-09-15 DIAGNOSIS — J181 Lobar pneumonia, unspecified organism: Secondary | ICD-10-CM

## 2016-09-15 DIAGNOSIS — J189 Pneumonia, unspecified organism: Secondary | ICD-10-CM

## 2016-09-15 MED ORDER — AMOXICILLIN-POT CLAVULANATE 875-125 MG PO TABS: 1.0000 | ORAL_TABLET | Freq: Two times a day (BID) | ORAL | 0 refills | Status: DC

## 2016-09-16 NOTE — Progress Notes (Signed)
Subjective:    Patient ID: Evan [name], male    DOB: [birth year], 83 y.o.   MRN: [id number]  HPI Was recently seen in the emergency room for weakness and fever greater than 100. In the emergency room, chest x-ray revealed right lower lobe opacity in the lung and the patient was treated for pneumonia with Zithromax. Patient states that he had no cough but he did have fever. He also reports weakness vitamin gradually worsening. He also reports orthostatic near syncope on 3 separate occasions this week. When he stands up, he feels like he is going to black out. Occurs only with position changes. Also reports fatigue. I reviewed the x-ray and compare the x-ray to a previous x-ray from February. Patient does have chronic scar tissue in the right lower lobe secondary to radiation and treatment for his cancer. However the opacity is more prevalent on the x-ray last week than compared to February. Furthermore he has prominent right basilar crackles on examination. I reviewed the lab work including a CBC which showed no anemia or leukocytosis, a CMP which revealed no signs of dehydration or electrolyte disturbances. A normal troponin. Past Medical History:  Diagnosis Date  . Adenocarcinoma of right lung ([place]) 01/06/2016  . Anginal chest pain at rest [facility])    Chronic non, controlled on Lorazepam  . Anxiety   . Arthritis    "all over"   . BPH (benign prostatic hyperplasia)   . CAD (coronary artery disease)   . Chronic lower back pain   . Complication of anesthesia    "problems making water afterwards"  . Depression   . DJD (degenerative joint disease)   . Esophageal ulcer without bleeding    bid ppi indefinitely  . Family history of adverse reaction to anesthesia    "children w/PONV"  . GERD (gastroesophageal reflux disease)   . Headache    "couple/week maybe" (09/04/2015)  . Hemochromatosis    Possible, elevated iron stores, hook-like osteophytes on hand films, normal LFTs  . Hepatitis    "yellow jaundice as a baby"  . History of ASCVD    MULTIVESSEL  . Hyperlipidemia   . Hypertension   . Myocardial infarction ([place]) 1999  . Osteoarthritis   . Pneumonia    "several times; got a little now" (09/04/2015)  . Rheumatoid arthritis ([place])   . Subdural hematoma ([facility])    small after fall 09/2016-plavix held, neurosurgery consulted.  Asymptomatic.     Past Surgical History:  Procedure Laterality Date  . ARM SKIN LESION BIOPSY / EXCISION Left 09/02/2015  . CATARACT EXTRACTION W/ INTRAOCULAR LENS  IMPLANT, BILATERAL Bilateral   . CHOLECYSTECTOMY OPEN    . CORNEAL TRANSPLANT Bilateral    "one at [facility]; one at [facility]"  . CORONARY ANGIOPLASTY WITH STENT PLACEMENT    . CORONARY ARTERY BYPASS GRAFT  1999  . DESCENDING AORTIC ANEURYSM REPAIR W/ STENT     DE stent ostium into the right radial free graft at OM-, 02-2007  . ESOPHAGOGASTRODUODENOSCOPY N/A 11/09/2014   Procedure: ESOPHAGOGASTRODUODENOSCOPY (EGD);  Surgeon: [name], MD;  Location: [facility] ENDOSCOPY;  Service: Endoscopy;  Laterality: N/A;  . INGUINAL HERNIA REPAIR    . JOINT REPLACEMENT    . NASAL SINUS SURGERY    . SHOULDER OPEN ROTATOR CUFF REPAIR Right   . TOTAL KNEE ARTHROPLASTY Bilateral    Current Outpatient Prescriptions on File Prior to Visit  Medication Sig Dispense Refill  . acetaminophen (TYLENOL) 500 MG tablet Take 500 mg by mouth  every 6 (six) hours as needed for headache (pain).    Marland Kitchen amoxicillin (AMOXIL) 500 MG capsule Take 2,000 mg by mouth See admin instructions. Take 4 capsules (2000 mg) by mouth one hour prior to dental appointments    . atenolol (TENORMIN) 50 MG tablet TAKE 1 TABLET BY MOUTH  EVERY DAY (Patient taking differently: Take 25-50 mg by mouth See admin instructions. Take 1 tablet (50 mg) by mouth every morning, reduce to 1/2 tablet (25 mg) if dyastolic blood pressure is <49) 30 tablet 1  . Cholecalciferol 2000 units TABS Take 1 tablet (2,000 Units total) by mouth daily. (Patient taking differently: Take  1 tablet by mouth daily. Vitamin D3) 30 each 11  . clopidogrel (PLAVIX) 75 MG tablet DO NOT RESUME TAKING THIS MEDICINE UNTILFRIDAY, AUGUST 12. THEN RESUME TAKING ONA DAILY BASIS. (Patient taking differently: 1 tab po qd) 90 tablet 1  . Cyanocobalamin (VITAMIN B-12) 2500 MCG SUBL Place 2,500 mcg under the tongue daily.    Marland Kitchen FLUoxetine (PROZAC) 20 MG capsule TAKE ONE CAPSULE BY MOUTH DAILY 90 capsule 1  . leflunomide (ARAVA) 10 MG tablet Take 10 mg by mouth daily.     Marland Kitchen Lifitegrast (XIIDRA) 5 % SOLN Place 1-2 drops into both eyes daily.    Marland Kitchen LORazepam (ATIVAN) 1 MG tablet TAKE ONE (1) TABLET BY MOUTH 3 TIMES DAILY (TAKE AT EIGHT IN THE MORNING NOONAND FOUR PM) (Patient taking differently: TAKE ONE TABLET (1 MG) BY MOUTH EVERY MORNING AND AT NIGHT, TAKE 1/2-1 TABLET (0.5 - 1 mg) AT LUNCH) 90 tablet 1  . nitroGLYCERIN (NITROSTAT) 0.4 MG SL tablet Place 1 tablet (0.4 mg total) under the tongue every 5 (five) minutes as needed for chest pain. 25 tablet 0  . pantoprazole (PROTONIX) 40 MG tablet Take 1 tablet (40 mg total) by mouth daily. 90 tablet 2  . rosuvastatin (CRESTOR) 5 MG tablet TAKE 1 TABLET (5 MG TOTAL) BY MOUTH DAILY. 90 tablet 0  . tamsulosin (FLOMAX) 0.4 MG CAPS capsule TAKE 1 CAPSULE EVERY DAY 90 capsule 3  . albuterol (VENTOLIN HFA) 108 (90 Base) MCG/ACT inhaler Inhale 1-2 puffs into the lungs every 6 (six) hours as needed for wheezing or shortness of breath. (Patient not taking: Reported on 09/10/2016) 18 g 2  . HYDROcodone-acetaminophen (NORCO/VICODIN) 5-325 MG tablet Take 1 tablet by mouth as needed. (Patient not taking: Reported on 09/10/2016) 20 tablet 0  . Spacer/Aero-Holding Chambers (AEROCHAMBER PLUS WITH MASK) inhaler Use as instructed (Patient not taking: Reported on 09/10/2016) 1 each 2   No current facility-administered medications on file prior to visit.    Allergies  Allergen Reactions  . Feldene [Piroxicam] Other (See Comments)    REACTION: Blisters  . Statins Other (See  Comments)    Myalgias  . Doxycycline Other (See Comments)    REACTION:  unknown  . Latex Rash  . Tequin Anxiety  . Valium Other (See Comments)    REACTION:  unknown   Social History   Social History  . Marital status: Widowed    Spouse name: N/A  . Number of children: N/A  . Years of education: N/A   Occupational History  . Not on file.   Social History Main Topics  . Smoking status: Never Smoker  . Smokeless tobacco: Former Systems developer    Types: Chew     Comment: "quit chewing in the 1960s"  . Alcohol use No  . Drug use: No  . Sexual activity: No   Other Topics Concern  .  Not on file   Social History Narrative  . No narrative on file      Review of Systems  All other systems reviewed and are negative.      Objective:   Physical Exam  Constitutional: He appears well-developed and well-nourished. No distress.  HENT:  Right Ear: External ear normal.  Left Ear: External ear normal.  Mouth/Throat: Oropharynx is clear and moist.  Eyes: Conjunctivae are normal. No scleral icterus.  Neck: Neck supple. No JVD present.  Cardiovascular: Normal rate, regular rhythm and normal heart sounds.   Pulmonary/Chest: Effort normal. No respiratory distress. He has no wheezes. He has rales.  Abdominal: Soft. Bowel sounds are normal. He exhibits no distension. There is no tenderness. There is no rebound and no guarding.  Musculoskeletal: He exhibits no edema.  Lymphadenopathy:    He has no cervical adenopathy.  Skin: No rash noted. He is not diaphoretic. No erythema.  Vitals reviewed.         Assessment & Plan:  Pneumonia of right lower lobe due to infectious organism ([place])  Weakness  I will add Augmentin 875 mg by mouth twice a day to extend coverage for streptococcal pneumonia. Patient has been covered for atypicals with Zithromax.  I also believe some of his weakness is multifactorial including age, cancer, and polypharmacy. I recommended decreasing atenolol to 25 mg a day  given his orthostatic near syncope and then discontinuing next week. We will wean the patient down gradually to avoid tachycardia. Some of his symptoms sound like low blood pressure. I also continue to encourage the patient and his daughter to gradually wean him away from alprazolam because of believe this contributed to his weakness, his memory problems, his fall risk. I explained this in great detail. Reassess in 10 days or sooner if worse

## 2016-09-19 DIAGNOSIS — L299 Pruritus, unspecified: Secondary | ICD-10-CM | POA: Diagnosis not present

## 2016-09-26 ENCOUNTER — Ambulatory Visit: Payer: Commercial Managed Care - HMO | Admitting: Family Medicine

## 2016-10-03 ENCOUNTER — Ambulatory Visit (INDEPENDENT_AMBULATORY_CARE_PROVIDER_SITE_OTHER): Payer: Medicare HMO | Admitting: Family Medicine

## 2016-10-03 ENCOUNTER — Encounter: Payer: Self-pay | Admitting: Family Medicine

## 2016-10-03 VITALS — BP 110/68 | HR 72 | Temp 97.5°F | Resp 16 | Ht 68.0 in | Wt 168.0 lb

## 2016-10-03 DIAGNOSIS — R531 Weakness: Secondary | ICD-10-CM

## 2016-10-03 DIAGNOSIS — J181 Lobar pneumonia, unspecified organism: Secondary | ICD-10-CM

## 2016-10-03 DIAGNOSIS — C3491 Malignant neoplasm of unspecified part of right bronchus or lung: Secondary | ICD-10-CM | POA: Diagnosis not present

## 2016-10-03 DIAGNOSIS — J189 Pneumonia, unspecified organism: Secondary | ICD-10-CM

## 2016-10-03 LAB — CBC WITH DIFFERENTIAL/PLATELET
Basophils Absolute: 0 cells/uL (ref 0–200)
Basophils Relative: 0 %
Eosinophils Absolute: 63 cells/uL (ref 15–500)
Eosinophils Relative: 1 %
HCT: 39.8 % (ref 38.5–50.0)
Hemoglobin: 13.2 g/dL (ref 13.0–17.0)
Lymphocytes Relative: 17 %
Lymphs Abs: 1071 cells/uL (ref 850–3900)
MCH: 30.6 pg (ref 27.0–33.0)
MCHC: 33.2 g/dL (ref 32.0–36.0)
MCV: 92.3 fL (ref 80.0–100.0)
MPV: 12.8 fL — ABNORMAL HIGH (ref 7.5–12.5)
Monocytes Absolute: 1197 cells/uL — ABNORMAL HIGH (ref 200–950)
Monocytes Relative: 19 %
Neutro Abs: 3969 cells/uL (ref 1500–7800)
Neutrophils Relative %: 63 %
Platelets: 98 10*3/uL — ABNORMAL LOW (ref 140–400)
RBC: 4.31 MIL/uL (ref 4.20–5.80)
RDW: 14.2 % (ref 11.0–15.0)
WBC: 6.3 10*3/uL (ref 3.8–10.8)

## 2016-10-03 LAB — COMPLETE METABOLIC PANEL WITH GFR
ALT: 8 U/L — ABNORMAL LOW (ref 9–46)
AST: 15 U/L (ref 10–35)
Albumin: 3.8 g/dL (ref 3.6–5.1)
Alkaline Phosphatase: 112 U/L (ref 40–115)
BUN: 17 mg/dL (ref 7–25)
CO2: 20 mmol/L (ref 20–31)
Calcium: 9 mg/dL (ref 8.6–10.3)
Chloride: 105 mmol/L (ref 98–110)
Creat: 1.06 mg/dL (ref 0.70–1.11)
GFR, Est African American: 73 mL/min (ref >=60)
GFR, Est Non African American: 63 mL/min (ref >=60)
Glucose, Bld: 122 mg/dL — ABNORMAL HIGH (ref 70–99)
Potassium: 3.9 mmol/L (ref 3.5–5.3)
Sodium: 136 mmol/L (ref 135–146)
Total Bilirubin: 1.7 mg/dL — ABNORMAL HIGH (ref 0.2–1.2)
Total Protein: 6.4 g/dL (ref 6.1–8.1)

## 2016-10-03 NOTE — Progress Notes (Signed)
Subjective:    Patient ID: Evan [name], male    DOB: 01-25-1928, 83 y.o.   MRN: [id number]  HPI   09/15/16 Was recently seen in the emergency room for weakness and fever greater than 100. In the emergency room, chest x-ray revealed right lower lobe opacity in the lung and the patient was treated for pneumonia with Zithromax. Patient states that he had no cough but he did have fever. He also reports weakness vitamin gradually worsening. He also reports orthostatic near syncope on 3 separate occasions this week. When he stands up, he feels like he is going to black out. Occurs only with position changes. Also reports fatigue. I reviewed the x-ray and compare the x-ray to a previous x-ray from February. Patient does have chronic scar tissue in the right lower lobe secondary to radiation and treatment for his cancer. However the opacity is more prevalent on the x-ray last week than compared to February. Furthermore he has prominent right basilar crackles on examination. I reviewed the lab work including a CBC which showed no anemia or leukocytosis, a CMP which revealed no signs of dehydration or electrolyte disturbances and a  normal troponin.  At that time, my plan was:  I will add Augmentin 875 mg by mouth twice a day to extend coverage for streptococcal pneumonia. Patient has been covered for atypicals with Zithromax.  I also believe some of his weakness is multifactorial including age, cancer, and polypharmacy. I recommended decreasing atenolol to 25 mg a day given his orthostatic near syncope and then discontinuing next week. We will wean the patient down gradually to avoid tachycardia. Some of his symptoms sound like low blood pressure. I also continue to encourage the patient and his daughter to gradually wean him away from alprazolam because of believe this contributed to his weakness, his memory problems, his fall risk. I explained this in great detail. Recently had a fall resulting in a small  subdural hematoma and plavix was help temporarily due to this. Reassess in 10 days or sooner if worse  10/03/16  Both the patient and his daughter state that he felt better after he completed the Augmentin. However after only 5 days, his symptoms returned. His daughter states that even had a low-grade fever last week. He denies any cough but he does report right sided pleurisy and chest pain in his lower right lung. This was the location of his recent pneumonia. He is also lost 2 additional pounds. Past medical history significant for adenocarcinoma of the right lung as well as radiation pneumonitis in the right lung. Both of these can be contributing factors. He denies any hemoptysis. He denies any exacerbation of the pain with food. He denies any blood in his stool. He denies any melena. He denies any nausea or vomiting Past Medical History:  Diagnosis Date  . Adenocarcinoma of right lung ([place]) 01/06/2016  . Anginal chest pain at rest [facility])    Chronic non, controlled on Lorazepam  . Anxiety   . Arthritis    "all over"   . BPH (benign prostatic hyperplasia)   . CAD (coronary artery disease)   . Chronic lower back pain   . Complication of anesthesia    "problems making water afterwards"  . Depression   . DJD (degenerative joint disease)   . Esophageal ulcer without bleeding    bid ppi indefinitely  . Family history of adverse reaction to anesthesia    "children w/PONV"  . GERD (gastroesophageal reflux disease)   .  Headache    "couple/week maybe" (09/04/2015)  . Hemochromatosis    Possible, elevated iron stores, hook-like osteophytes on hand films, normal LFTs  . Hepatitis    "yellow jaundice as a baby"  . History of ASCVD    MULTIVESSEL  . Hyperlipidemia   . Hypertension   . Myocardial infarction ([place]) 1999  . Osteoarthritis   . Pneumonia    "several times; got a little now" (09/04/2015)  . Rheumatoid arthritis ([place])   . Subdural hematoma ([facility])    small after fall 09/2016-plavix  held, neurosurgery consulted.  Asymptomatic.     Past Surgical History:  Procedure Laterality Date  . ARM SKIN LESION BIOPSY / EXCISION Left 09/02/2015  . CATARACT EXTRACTION W/ INTRAOCULAR LENS  IMPLANT, BILATERAL Bilateral   . CHOLECYSTECTOMY OPEN    . CORNEAL TRANSPLANT Bilateral    "one at [facility]; one at [facility]"  . CORONARY ANGIOPLASTY WITH STENT PLACEMENT    . CORONARY ARTERY BYPASS GRAFT  1999  . DESCENDING AORTIC ANEURYSM REPAIR W/ STENT     DE stent ostium into the right radial free graft at OM-, 02-2007  . ESOPHAGOGASTRODUODENOSCOPY N/A 11/09/2014   Procedure: ESOPHAGOGASTRODUODENOSCOPY (EGD);  Surgeon: [name], MD;  Location: [facility] ENDOSCOPY;  Service: Endoscopy;  Laterality: N/A;  . INGUINAL HERNIA REPAIR    . JOINT REPLACEMENT    . NASAL SINUS SURGERY    . SHOULDER OPEN ROTATOR CUFF REPAIR Right   . TOTAL KNEE ARTHROPLASTY Bilateral    Current Outpatient Prescriptions on File Prior to Visit  Medication Sig Dispense Refill  . acetaminophen (TYLENOL) 500 MG tablet Take 500 mg by mouth every 6 (six) hours as needed for headache (pain).    Marland Kitchen albuterol (VENTOLIN HFA) 108 (90 Base) MCG/ACT inhaler Inhale 1-2 puffs into the lungs every 6 (six) hours as needed for wheezing or shortness of breath. (Patient not taking: Reported on 09/10/2016) 18 g 2  . amoxicillin (AMOXIL) 500 MG capsule Take 2,000 mg by mouth See admin instructions. Take 4 capsules (2000 mg) by mouth one hour prior to dental appointments    . amoxicillin-clavulanate (AUGMENTIN) 875-125 MG tablet Take 1 tablet by mouth 2 (two) times daily. 20 tablet 0  . atenolol (TENORMIN) 50 MG tablet TAKE 1 TABLET BY MOUTH  EVERY DAY (Patient taking differently: Take 25-50 mg by mouth See admin instructions. Take 1 tablet (50 mg) by mouth every morning, reduce to 1/2 tablet (25 mg) if dyastolic blood pressure is <49) 30 tablet 1  . Cholecalciferol 2000 units TABS Take 1 tablet (2,000 Units total) by mouth daily. (Patient taking differently:  Take 1 tablet by mouth daily. Vitamin D3) 30 each 11  . clopidogrel (PLAVIX) 75 MG tablet DO NOT RESUME TAKING THIS MEDICINE UNTILFRIDAY, AUGUST 12. THEN RESUME TAKING ONA DAILY BASIS. (Patient taking differently: 1 tab po qd) 90 tablet 1  . Cyanocobalamin (VITAMIN B-12) 2500 MCG SUBL Place 2,500 mcg under the tongue daily.    Marland Kitchen FLUoxetine (PROZAC) 20 MG capsule TAKE ONE CAPSULE BY MOUTH DAILY 90 capsule 1  . HYDROcodone-acetaminophen (NORCO/VICODIN) 5-325 MG tablet Take 1 tablet by mouth as needed. (Patient not taking: Reported on 09/10/2016) 20 tablet 0  . leflunomide (ARAVA) 10 MG tablet Take 10 mg by mouth daily.     Marland Kitchen Lifitegrast (XIIDRA) 5 % SOLN Place 1-2 drops into both eyes daily.    Marland Kitchen LORazepam (ATIVAN) 1 MG tablet TAKE ONE (1) TABLET BY MOUTH 3 TIMES DAILY (TAKE AT EIGHT IN THE MORNING  NOONAND FOUR PM) (Patient taking differently: TAKE ONE TABLET (1 MG) BY MOUTH EVERY MORNING AND AT NIGHT, TAKE 1/2-1 TABLET (0.5 - 1 mg) AT LUNCH) 90 tablet 1  . nitroGLYCERIN (NITROSTAT) 0.4 MG SL tablet Place 1 tablet (0.4 mg total) under the tongue every 5 (five) minutes as needed for chest pain. 25 tablet 0  . pantoprazole (PROTONIX) 40 MG tablet Take 1 tablet (40 mg total) by mouth daily. 90 tablet 2  . rosuvastatin (CRESTOR) 5 MG tablet TAKE 1 TABLET (5 MG TOTAL) BY MOUTH DAILY. 90 tablet 0  . Spacer/Aero-Holding Chambers (AEROCHAMBER PLUS WITH MASK) inhaler Use as instructed (Patient not taking: Reported on 09/10/2016) 1 each 2  . tamsulosin (FLOMAX) 0.4 MG CAPS capsule TAKE 1 CAPSULE EVERY DAY 90 capsule 3   No current facility-administered medications on file prior to visit.    Allergies  Allergen Reactions  . Feldene [Piroxicam] Other (See Comments)    REACTION: Blisters  . Statins Other (See Comments)    Myalgias  . Doxycycline Other (See Comments)    REACTION:  unknown  . Latex Rash  . Tequin Anxiety  . Valium Other (See Comments)    REACTION:  unknown   Social History   Social History   . Marital status: Widowed    Spouse name: N/A  . Number of children: N/A  . Years of education: N/A   Occupational History  . Not on file.   Social History Main Topics  . Smoking status: Never Smoker  . Smokeless tobacco: Former Systems developer    Types: Chew     Comment: "quit chewing in the 1960s"  . Alcohol use No  . Drug use: No  . Sexual activity: No   Other Topics Concern  . Not on file   Social History Narrative  . No narrative on file      Review of Systems  All other systems reviewed and are negative.      Objective:   Physical Exam  Constitutional: He appears well-developed and well-nourished. No distress.  HENT:  Right Ear: External ear normal.  Left Ear: External ear normal.  Mouth/Throat: Oropharynx is clear and moist.  Eyes: Conjunctivae are normal. No scleral icterus.  Neck: Neck supple. No JVD present.  Cardiovascular: Normal rate, regular rhythm and normal heart sounds.   Pulmonary/Chest: Effort normal. No respiratory distress. He has no wheezes. He has rales.  Abdominal: Soft. Bowel sounds are normal. He exhibits no distension. There is no tenderness. There is no rebound and no guarding.  Musculoskeletal: He exhibits no edema.  Lymphadenopathy:    He has no cervical adenopathy.  Skin: No rash noted. He is not diaphoretic. No erythema.  Vitals reviewed.   Continues to have right lower lobe Rales and crackles in the posterior lung field.      Assessment & Plan:  Weakness - Plan: CBC with Differential/Platelet, COMPLETE METABOLIC PANEL WITH GFR  Pneumonia of right lower lobe due to infectious organism [facility])  If he is developing a recurrent pneumonia just one week after antibiotics I believe this is highly atypical. I believe we need more comprehensive imaging of the lungs but just a plain chest x-ray. I'm concerned by his worsening shortness of breath and his worsening chest pain after adequate antibiotics. Therefore we'll proceed with a CT scan of the  chest to determine if the adenocarcinoma of the lung is worsening. I will also check a CBC as well as a CMP to evaluate for anemia and evidence  of dehydration is contributing factors. Schedule CAT scan as quickly as possible

## 2016-10-04 ENCOUNTER — Other Ambulatory Visit: Payer: Self-pay | Admitting: Family Medicine

## 2016-10-04 ENCOUNTER — Ambulatory Visit
Admission: RE | Admit: 2016-10-04 | Discharge: 2016-10-04 | Disposition: A | Discharge status: Home / Self Care | Payer: Medicare HMO | Source: Ambulatory Visit | Attending: Family Medicine | Admitting: Family Medicine

## 2016-10-04 DIAGNOSIS — J189 Pneumonia, unspecified organism: Secondary | ICD-10-CM

## 2016-10-04 DIAGNOSIS — J181 Lobar pneumonia, unspecified organism: Secondary | ICD-10-CM

## 2016-10-04 DIAGNOSIS — R0602 Shortness of breath: Secondary | ICD-10-CM | POA: Diagnosis not present

## 2016-10-04 DIAGNOSIS — R531 Weakness: Secondary | ICD-10-CM

## 2016-10-04 DIAGNOSIS — C3491 Malignant neoplasm of unspecified part of right bronchus or lung: Secondary | ICD-10-CM

## 2016-10-04 MED ORDER — AZITHROMYCIN 250 MG PO TABS: ORAL_TABLET | ORAL | 0 refills | Status: DC

## 2016-10-04 MED ORDER — CEFUROXIME AXETIL 500 MG PO TABS: 500.0000 mg | ORAL_TABLET | Freq: Two times a day (BID) | ORAL | 0 refills | Status: DC

## 2016-10-11 ENCOUNTER — Other Ambulatory Visit: Payer: Self-pay | Admitting: Pulmonary Disease

## 2016-10-11 ENCOUNTER — Other Ambulatory Visit (INDEPENDENT_AMBULATORY_CARE_PROVIDER_SITE_OTHER): Payer: Medicare HMO

## 2016-10-11 ENCOUNTER — Encounter: Payer: Self-pay | Admitting: Pulmonary Disease

## 2016-10-11 ENCOUNTER — Ambulatory Visit (INDEPENDENT_AMBULATORY_CARE_PROVIDER_SITE_OTHER): Payer: Medicare HMO | Admitting: Pulmonary Disease

## 2016-10-11 ENCOUNTER — Ambulatory Visit (INDEPENDENT_AMBULATORY_CARE_PROVIDER_SITE_OTHER)
Admission: RE | Admit: 2016-10-11 | Discharge: 2016-10-11 | Disposition: A | Discharge status: Home / Self Care | Payer: Medicare HMO | Source: Ambulatory Visit | Attending: Pulmonary Disease | Admitting: Pulmonary Disease

## 2016-10-11 VITALS — BP 124/80 | HR 51 | Ht 68.0 in | Wt 169.6 lb

## 2016-10-11 DIAGNOSIS — R918 Other nonspecific abnormal finding of lung field: Secondary | ICD-10-CM

## 2016-10-11 DIAGNOSIS — R091 Pleurisy: Secondary | ICD-10-CM | POA: Diagnosis not present

## 2016-10-11 DIAGNOSIS — R079 Chest pain, unspecified: Secondary | ICD-10-CM | POA: Diagnosis not present

## 2016-10-11 DIAGNOSIS — R0781 Pleurodynia: Secondary | ICD-10-CM | POA: Diagnosis not present

## 2016-10-11 DIAGNOSIS — C3491 Malignant neoplasm of unspecified part of right bronchus or lung: Secondary | ICD-10-CM | POA: Diagnosis not present

## 2016-10-11 DIAGNOSIS — R0789 Other chest pain: Secondary | ICD-10-CM

## 2016-10-11 DIAGNOSIS — M069 Rheumatoid arthritis, unspecified: Secondary | ICD-10-CM | POA: Insufficient documentation

## 2016-10-11 LAB — C-REACTIVE PROTEIN: CRP: 0.6 mg/dL (ref 0.5–20.0)

## 2016-10-11 LAB — SEDIMENTATION RATE: Sed Rate: 15 mm/hr (ref 0–20)

## 2016-10-11 NOTE — Patient Instructions (Addendum)
   Notify our office if you have any new breathing problems or questions before your next appointment.   Remember to use moist heat to your left rib cage. You can use your usual over-the-counter pain medications.   Let us or your other physicians know if your chest pain is getting worse.  Avoid lifting anything over 1-2 pounds and riding on your tractor until you heal up from your chest pain.  TESTS ORDERED: 1. CXR PA/LAT & RIB FILMS 2. Serum ESR & CRP

## 2016-10-11 NOTE — Progress Notes (Signed)
Subjective:    Patient ID: Evan [name], male    DOB: [birth year], 83 y.o.   MRN: [id number]  C.C.:  Acute visit for Left Chest Pain & Pneumonia with known Adenocarcinoma Right Lung.   HPI Left Chest Pain:  He reports he awoke this morning with left chest pain. His daughter reports his pain may have started about 2 days ago but does seem to be worsening. He reports he does have pain with deep inspiration. He denies any radiation to his arm or shoulder. Patient denies any history of pain of this nature. He did travel to [facility] 2-3 weeks ago and did take a break mid-way where he got out of the car.  Pneumonia:  Patient last seen in office on 7/2. Previously treated with a course of azithromycin followed by Augmentin for questionable right lower lobe pneumonia. He has a mild, intermittent cough that is nonproductive. No fever or sweats but does have some chills. Maximum temperature is reported at 100F. Patient currently finishing course of Ceftin.  NSCLC:  Diagnosed with adenocarcinoma of the right lung on CT guided biopsy in September 2017. Follows with Dr. Julien Nordmann. Stage 1A. Patient underwent SBRT ending on 02/22/16 per records.   Review of Systems No nausea, abdominal pain, or emesis. Previously had diarrhea on Amoxicillin that has since resolved. He reports chronic diffusion rash for a year. No new bruising. No swelling or pain in his legs recently.   Allergies  Allergen Reactions  . Feldene [Piroxicam] Other (See Comments)    REACTION: Blisters  . Statins Other (See Comments)    Myalgias  . Doxycycline Other (See Comments)    REACTION:  unknown  . Latex Rash  . Tequin Anxiety  . Valium Other (See Comments)    REACTION:  unknown    Current Outpatient Prescriptions on File Prior to Visit  Medication Sig Dispense Refill  . acetaminophen (TYLENOL) 500 MG tablet Take 500 mg by mouth every 6 (six) hours as needed for headache (pain).    Marland Kitchen atenolol (TENORMIN) 50 MG tablet TAKE 1  TABLET BY MOUTH  EVERY DAY (Patient taking differently: Take 25-50 mg by mouth See admin instructions. Take 1 tablet (50 mg) by mouth every morning, reduce to 1/2 tablet (25 mg) if dyastolic blood pressure is <49) 30 tablet 1  . cefUROXime (CEFTIN) 500 MG tablet Take 1 tablet (500 mg total) by mouth 2 (two) times daily with a meal. 20 tablet 0  . clopidogrel (PLAVIX) 75 MG tablet DO NOT RESUME TAKING THIS MEDICINE UNTILFRIDAY, AUGUST 12. THEN RESUME TAKING ONA DAILY BASIS. 90 tablet 1  . FLUoxetine (PROZAC) 20 MG capsule TAKE ONE CAPSULE BY MOUTH DAILY 90 capsule 1  . leflunomide (ARAVA) 10 MG tablet Take 10 mg by mouth daily.     Marland Kitchen Lifitegrast (XIIDRA) 5 % SOLN Place 1-2 drops into both eyes daily.    Marland Kitchen LORazepam (ATIVAN) 1 MG tablet TAKE ONE (1) TABLET BY MOUTH 3 TIMES DAILY (TAKE AT EIGHT IN THE MORNING NOONAND FOUR PM) (Patient taking differently: TAKE ONE TABLET (1 MG) BY MOUTH EVERY MORNING AND AT NIGHT, TAKE 1/2-1 TABLET (0.5 - 1 mg) AT LUNCH) 90 tablet 1  . nitroGLYCERIN (NITROSTAT) 0.4 MG SL tablet Place 1 tablet (0.4 mg total) under the tongue every 5 (five) minutes as needed for chest pain. 25 tablet 0  . pantoprazole (PROTONIX) 40 MG tablet Take 1 tablet (40 mg total) by mouth daily. 90 tablet 2  . rosuvastatin (CRESTOR)  5 MG tablet TAKE 1 TABLET (5 MG TOTAL) BY MOUTH DAILY. 90 tablet 0  . tamsulosin (FLOMAX) 0.4 MG CAPS capsule TAKE 1 CAPSULE EVERY DAY 90 capsule 3  . albuterol (VENTOLIN HFA) 108 (90 Base) MCG/ACT inhaler Inhale 1-2 puffs into the lungs every 6 (six) hours as needed for wheezing or shortness of breath. (Patient not taking: Reported on 10/11/2016) 18 g 2  . amoxicillin (AMOXIL) 500 MG capsule Take 2,000 mg by mouth See admin instructions. Take 4 capsules (2000 mg) by mouth one hour prior to dental appointments    . Cholecalciferol 2000 units TABS Take 1 tablet (2,000 Units total) by mouth daily. (Patient not taking: Reported on 10/11/2016) 30 each 11  . Cyanocobalamin  (VITAMIN B-12) 2500 MCG SUBL Place 2,500 mcg under the tongue daily.    Marland Kitchen HYDROcodone-acetaminophen (NORCO/VICODIN) 5-325 MG tablet Take 1 tablet by mouth as needed. (Patient not taking: Reported on 10/11/2016) 20 tablet 0  . Spacer/Aero-Holding Chambers (AEROCHAMBER PLUS WITH MASK) inhaler Use as instructed (Patient not taking: Reported on 10/11/2016) 1 each 2   No current facility-administered medications on file prior to visit.     Past Medical History:  Diagnosis Date  . Adenocarcinoma of right lung ([place]) 01/06/2016  . Anginal chest pain at rest [facility])    Chronic non, controlled on Lorazepam  . Anxiety   . Arthritis    "all over"   . BPH (benign prostatic hyperplasia)   . CAD (coronary artery disease)   . Chronic lower back pain   . Complication of anesthesia    "problems making water afterwards"  . Depression   . DJD (degenerative joint disease)   . Esophageal ulcer without bleeding    bid ppi indefinitely  . Family history of adverse reaction to anesthesia    "children w/PONV"  . GERD (gastroesophageal reflux disease)   . Headache    "couple/week maybe" (09/04/2015)  . Hemochromatosis    Possible, elevated iron stores, hook-like osteophytes on hand films, normal LFTs  . Hepatitis    "yellow jaundice as a baby"  . History of ASCVD    MULTIVESSEL  . Hyperlipidemia   . Hypertension   . Myocardial infarction ([place]) 1999  . Osteoarthritis   . Pneumonia    "several times; got a little now" (09/04/2015)  . Rheumatoid arthritis ([place])   . Subdural hematoma ([facility])    small after fall 09/2016-plavix held, neurosurgery consulted.  Asymptomatic.      Past Surgical History:  Procedure Laterality Date  . ARM SKIN LESION BIOPSY / EXCISION Left 09/02/2015  . CATARACT EXTRACTION W/ INTRAOCULAR LENS  IMPLANT, BILATERAL Bilateral   . CHOLECYSTECTOMY OPEN    . CORNEAL TRANSPLANT Bilateral    "one at [facility]; one at [facility]"  . CORONARY ANGIOPLASTY WITH STENT PLACEMENT    . CORONARY ARTERY BYPASS  GRAFT  1999  . DESCENDING AORTIC ANEURYSM REPAIR W/ STENT     DE stent ostium into the right radial free graft at OM-, 02-2007  . ESOPHAGOGASTRODUODENOSCOPY N/A 11/09/2014   Procedure: ESOPHAGOGASTRODUODENOSCOPY (EGD);  Surgeon: [name], MD;  Location: [facility] ENDOSCOPY;  Service: Endoscopy;  Laterality: N/A;  . INGUINAL HERNIA REPAIR    . JOINT REPLACEMENT    . NASAL SINUS SURGERY    . SHOULDER OPEN ROTATOR CUFF REPAIR Right   . TOTAL KNEE ARTHROPLASTY Bilateral     Family History  Problem Relation Age of Onset  . Arthritis-Osteo Sister   . Heart attack Brother   .  Heart attack Other   . Cancer Neg Hx     Social History   Social History  . Marital status: Widowed    Spouse name: N/A  . Number of children: N/A  . Years of education: N/A   Social History Main Topics  . Smoking status: Never Smoker  . Smokeless tobacco: Former Systems developer    Types: Chew     Comment: "quit chewing in the 1960s"  . Alcohol use No  . Drug use: No  . Sexual activity: No   Other Topics Concern  . None   Social History Narrative  . None      Objective:   Physical Exam BP 124/80 (BP Location: Left Arm, Patient Position: Sitting, Cuff Size: Normal)   Pulse (!) 51   Ht 5' 8"  (1.727 m)   Wt 169 lb 9.6 oz (76.9 kg)   SpO2 98%   BMI 25.79 kg/m  General:  Awake. Alert. No acute distress. Elderly male. Accompanied by daughter today. Integument:  Warm & dry. No rash on exposed skin.  Extremities:  No cyanosis or clubbing. Slight bluish hue the patient's lips. HEENT:  Moist mucus membranes. No oral ulcers. No nasal turbinate swelling. No scleral icterus. Cardiovascular:  Regular rate. No edema. No appreciable JVD.  Pulmonary:  Minimal crackles right lung base. Otherwise good aeration bilaterally. Symmetric chest wall expansion. No accessory muscle use on room air. Abdomen: Soft. Normal bowel sounds. Nondistended. Nontender. Musculoskeletal:  Normal bulk and tone. No joint deformity or effusion  appreciated. No chest wall deformity. Tenderness to palpation in left chest wall lateral to the midclavicular line in the lower rib cage.  PFT 01/05/16: FVC 3.26 L (111%) FEV1 2.61 L (131%) FEV1/FVC 0.80 FEF 25-75 2.60 L (214%) negative bronchodilator response TLC 5.47 L (90%) RV 86% ERV 401% DLCO corrected 46%  WALK TEST 10/11/16:  Walked 1 lap / Baseline Sat 100% on RA / Nadir Sat 100% on RA (stopped due to feeling his legs were weak)  IMAGING CT CHEST W/ CONTRAST 10/04/16 (personally reviewed by me): Trace left pleural effusion. Pleural thickening on the right noted. No pathologic mediastinal adenopathy. No pericardial effusion. Improvement in right basilar density compared with CT imaging performed in February. There is also decreased subpleural patchy groundglass opacities as well. No new or developing areas of opacification or groundglass. Questionable subpleural reticulation, primarily on the right. No obvious honeycombing or bronchiectasis appreciated.  PET CT 12/22/15 (per radiologist):   IMPRESSION: 1. Hypermetabolic (max SUV 3.9) posterior right lower lobe predominantly solid 2.9 cm pulmonary nodule, which is increased in size back to 2013, most consistent with primary bronchogenic adenocarcinoma. 2. Patchy subpleural reticulation throughout both lungs, basilar predominant, suspicious for interstitial lung disease, not well characterized on this study. Consider correlation with high-resolution chest CT in 6-12 months to assess temporal pattern stability, as clinically warranted. 3. Hypermetabolic bilateral hilar and mediastinal lymphadenopathy is highly nonspecific given the suspected underlying interstitial lung disease, and could be reactive or metastatic. 4. Nonspecific hypermetabolic right level 2 neck lymph node, favor reactive node, cannot exclude metastasis. 5. No hypermetabolic osseous or abdominopelvic metastatic disease.  6. Mild hypermetabolism associated with subcutaneous fat  stranding in the lateral right hip soft tissues, probably benign, correlate with any history of recent trauma or procedure in this location. 7. Relatively symmetric hypermetabolism in the bilateral glenohumeral joints with associated osteoarthritis, favor degenerative uptake. 8. Additional findings include mild cardiomegaly, aortic atherosclerosis, coronary atherosclerosis status post CABG and moderate sigmoid diverticulosis.  CARDIAC TTE (05/23/16): The normal in size with moderate concentric hypertrophy. EF 60-65% with normal diastolic function. LA moderately to severely dilated with RA normal in size. RV normal in size and function. Mild aortic stenosis with moderately calcified leaflets. Mild mitral regurgitation. Trivial pulmonic regurgitation. Mild tricuspid regurgitation.  PATHOLOGY CT-GUIDED BIOPSY RIGHT LOWER LOBE MASS (12/30/15): Adenocarcinoma  LABS CBC Latest Ref Rng & Units 10/03/2016 09/10/2016 08/23/2016  WBC 3.8 - 10.8 K/uL 6.3 7.3 6.7  Hemoglobin 13.0 - 17.0 g/dL 13.2 12.7(L) 12.9(L)  Hematocrit 38.5 - 50.0 % 39.8 38.6(L) 38.5  Platelets 140 - 400 K/uL 98(L) 90(L) SEE NOTE   BMP Latest Ref Rng & Units 10/03/2016 09/10/2016 08/23/2016  Glucose 70 - 99 mg/dL 122(H) 139(H) 94  BUN 7 - 25 mg/dL 17 19 18   Creatinine 0.70 - 1.11 mg/dL 1.06 0.98 1.19(H)  Sodium 135 - 146 mmol/L 136 133(L) 139  Potassium 3.5 - 5.3 mmol/L 3.9 4.1 4.2  Chloride 98 - 110 mmol/L 105 106 106  CO2 20 - 31 mmol/L 20 18(L) 20  Calcium 8.6 - 10.3 mg/dL 9.0 8.9 9.2   Hepatic Function Latest Ref Rng & Units 10/03/2016 09/10/2016 08/23/2016  Total Protein 6.1 - 8.1 g/dL 6.4 6.1(L) 6.1  Albumin 3.6 - 5.1 g/dL 3.8 3.4(L) 3.9  AST 10 - 35 U/L 15 22 17   ALT 9 - 46 U/L 8(L) 11(L) 8(L)  Alk Phosphatase 40 - 115 U/L 112 93 95  Total Bilirubin 0.2 - 1.2 mg/dL 1.7(H) 1.2 1.2  Bilirubin, Direct 0.1 - 0.5 mg/dL - 0.2 -      Assessment & Plan:  83 y.o. male with right lower lobe adenocarcinoma. Status post radiation therapy.  Patient does appear to have had some radiation pneumonitis on the right which is progressively improving on repeat imaging. Patient does have some reproducible chest wall pain on the left but without obvious source of trauma. It's possible that this was exacerbated by his [place]. I am holding off on initiating prednisone therapy at this time. I instructed the patient to notify my office if his symptoms did not improve or worsened before his next appointment.  1. Right lower lobe opacity:  Suspect this is resolving radiation pneumonitis/treatment of right lung cancer. Low probability for infectious process. Suspect resolving radiation pneumonitis given improvement with serial exams. 2. Adenocarcinoma right lung: Status post treatment with radiation therapy. Continuing to follow-up with medical oncology. 3. Pleurisy/Chest Wall Pain: Appears to be musculoskeletal in etiology given reproducibility. Checking ESR and CRP today. Checking chest x-ray PA/LAT and rib films. Recommended pain medication & moist heat for pain relief. 4. Health maintenance: Status post Tdap December 2011, Pneumovax October 2001 & Prevnar March 2016. 5. Follow-up: Return to clinic in 4 weeks or sooner if needed.   Sonia Baller Ashok Cordia, M.D. [facility] Pager:  [phone] After 3pm or if no response, call [phone] 11:32 AM 10/11/16

## 2016-10-13 NOTE — Progress Notes (Signed)
TTC, NA, WCBL.

## 2016-10-14 ENCOUNTER — Ambulatory Visit: Payer: Medicare HMO | Admitting: Family Medicine

## 2016-10-14 ENCOUNTER — Telehealth: Payer: Self-pay | Admitting: Family Medicine

## 2016-10-14 ENCOUNTER — Other Ambulatory Visit: Payer: Self-pay | Admitting: Family Medicine

## 2016-10-14 ENCOUNTER — Other Ambulatory Visit: Payer: Self-pay | Admitting: Interventional Cardiology

## 2016-10-14 NOTE — Telephone Encounter (Signed)
ok 

## 2016-10-14 NOTE — Telephone Encounter (Signed)
Pt needs refill on lorazepam he is going out of town on Sunday and will run out.

## 2016-10-14 NOTE — Telephone Encounter (Signed)
Ok to refill 

## 2016-10-17 NOTE — Telephone Encounter (Signed)
Medication called/sent to requested pharmacy on 10/14/16

## 2016-10-17 NOTE — Progress Notes (Signed)
Contacted patient and Evan [name], pt daughter noted on DPR, was informed of results. She verbalized understanding and did not have any questions. Nothing further is needed.

## 2016-10-25 DIAGNOSIS — L299 Pruritus, unspecified: Secondary | ICD-10-CM | POA: Diagnosis not present

## 2016-10-28 ENCOUNTER — Other Ambulatory Visit: Payer: Self-pay | Admitting: Interventional Cardiology

## 2016-11-09 ENCOUNTER — Ambulatory Visit (INDEPENDENT_AMBULATORY_CARE_PROVIDER_SITE_OTHER): Payer: Medicare HMO | Admitting: Adult Health

## 2016-11-09 ENCOUNTER — Encounter: Payer: Self-pay | Admitting: Adult Health

## 2016-11-09 VITALS — BP 124/68 | HR 72 | Temp 97.8°F | Ht 68.0 in | Wt 171.0 lb

## 2016-11-09 DIAGNOSIS — J700 Acute pulmonary manifestations due to radiation: Secondary | ICD-10-CM | POA: Diagnosis not present

## 2016-11-09 DIAGNOSIS — C3491 Malignant neoplasm of unspecified part of right bronchus or lung: Secondary | ICD-10-CM | POA: Diagnosis not present

## 2016-11-09 DIAGNOSIS — R600 Localized edema: Secondary | ICD-10-CM | POA: Diagnosis not present

## 2016-11-09 DIAGNOSIS — R0602 Shortness of breath: Secondary | ICD-10-CM | POA: Diagnosis not present

## 2016-11-09 MED ORDER — PREDNISONE 10 MG PO TABS: ORAL_TABLET | ORAL | 0 refills | Status: DC

## 2016-11-09 NOTE — Addendum Note (Signed)
Addended by: Len Blalock on: 11/09/2016 11:06 AM   Modules accepted: Orders

## 2016-11-09 NOTE — Assessment & Plan Note (Signed)
Does not appear to fluid overload  Check bnp

## 2016-11-09 NOTE — Assessment & Plan Note (Signed)
?  radiation Pneumonitis - CT chest shows improvement in July . ESR is low .  Will give low dose steroid trial to see if any improvement  Check BNP  Check ONO as PAH.  No desats on walk test in July  Spirometry today shows some restriction compared to last year.   Plan  Patient Instructions  Begin prednisone '20mg'$  daily for 1 week , then '10mg'$  daily for 1 week , then '5mg'$  daily 1 week and stop .  Check ONO At bedtime  .  Labs today .  Follow up with Dr. Ashok Cordia in 4-6 weeks and As needed   Please contact office for sooner follow up if symptoms do not improve or worsen or seek emergency care '

## 2016-11-09 NOTE — Patient Instructions (Addendum)
Begin prednisone 20mg  daily for 1 week , then 10mg  daily for 1 week , then 5mg  daily 1 week and stop .  Check ONO At bedtime  .  Labs today .  Follow up with Dr. Ashok Cordia in 4-6 weeks and As needed   Please contact office for sooner follow up if symptoms do not improve or worsen or seek emergency care '

## 2016-11-09 NOTE — Assessment & Plan Note (Signed)
Cont follow up with oncology and serial CT chest follow up .

## 2016-11-09 NOTE — Progress Notes (Signed)
_0  ID: Evan [name], male    DOB: 02-17-1928, 83 y.o.   MRN: [id number]  Chief Complaint  Patient presents with  . Follow-up    Dyspnea     Referring provider: [name], MD  HPI: 83 yo male never smoker followed for Lung cancer -Adenocarcinoma of right lung 12/2015 s/p SBRT and suspected  Radiation Pneumonitis  RA on ARAVA .   TEST  PFT 01/05/16: FVC 3.26 L (111%) FEV1 2.61 L (131%) FEV1/FVC 0.80 FEF 25-75 2.60 L (214%) negative bronchodilator response TLC 5.47 L (90%) RV 86% ERV 401% DLCO corrected 46%  WALK TEST 10/11/16:  Walked 1 lap / Baseline Sat 100% on RA / Nadir Sat 100% on RA (stopped due to feeling his legs were weak)  IMAGING CT CHEST W/ CONTRAST 10/04/16 (personally reviewed by me): Trace left pleural effusion. Pleural thickening on the right noted. No pathologic mediastinal adenopathy. No pericardial effusion. Improvement in right basilar density compared with CT imaging performed in February. There is also decreased subpleural patchy groundglass opacities as well. No new or developing areas of opacification or groundglass. Questionable subpleural reticulation, primarily on the right. No obvious honeycombing or bronchiectasis appreciated.  PET CT 12/22/15 (per radiologist):   IMPRESSION: 1. Hypermetabolic (max SUV 3.9) posterior right lower lobe predominantly solid 2.9 cm pulmonary nodule, which is increased in size back to 2013, most consistent with primary bronchogenic adenocarcinoma. 2. Patchy subpleural reticulation throughout both lungs, basilar predominant, suspicious for interstitial lung disease, not well characterized on this study. Consider correlation with high-resolution chest CT in 6-12 months to assess temporal pattern stability, as clinically warranted. 3. Hypermetabolic bilateral hilar and mediastinal lymphadenopathy is highly nonspecific given the suspected underlying interstitial lung disease, and could be reactive or metastatic. 4.  Nonspecific hypermetabolic right level 2 neck lymph node, favor reactive node, cannot exclude metastasis. 5. No hypermetabolic osseous or abdominopelvic metastatic disease.  6. Mild hypermetabolism associated with subcutaneous fat stranding in the lateral right hip soft tissues, probably benign, correlate with any history of recent trauma or procedure in this location. 7. Relatively symmetric hypermetabolism in the bilateral glenohumeral joints with associated osteoarthritis, favor degenerative uptake. 8. Additional findings include mild cardiomegaly, aortic atherosclerosis, coronary atherosclerosis status post CABG and moderate sigmoid diverticulosis.  CARDIAC TTE (05/23/16): The normal in size with moderate concentric hypertrophy. EF 60-65% with normal diastolic function. LA moderately to severely dilated with RA normal in size. RV normal in size and function. Mild aortic stenosis with moderately calcified leaflets. Mild mitral regurgitation. Trivial pulmonic regurgitation. Mild tricuspid regurgitation.  PATHOLOGY CT-GUIDED BIOPSY RIGHT LOWER LOBE MASS (12/30/15): Adenocarcinoma  11/09/2016 Follow up : Radiation Pneumonitis /Lung cancer Pt returns for 4 week follow up . Says he is about the same continues to have sob. Feels it has been getting progressively worse for last 6 months. Does have some orthopnea. No increased leg swelling . Last echo in 05/2016 showed Moderate /severe LA dilation and PAP 63mHg.  CT chest in 10/2016 does show some improvement . Felt this was some resolving radiation pneumonitis . ESR was 15 in July .  He has some dry cough .  No fever, chest pain , orthopnea, edema or fever.  Spirometry today show some mild restriction with FVC at 70%, FEV1 is 87%, ratio 85%   Allergies  Allergen Reactions  . Feldene [Piroxicam] Other (See Comments)    REACTION: Blisters  . Statins Other (See Comments)    Myalgias  . Doxycycline Other (See Comments)  REACTION:  unknown  . Latex  Rash  . Tequin Anxiety  . Valium Other (See Comments)    REACTION:  unknown    Immunization History  Administered Date(s) Administered  . Influenza Whole 01/26/2007, 01/10/2008, 12/30/2009  . Influenza,inj,Quad PF,36+ Mos 12/18/2012, 01/16/2015, 12/29/2015  . Pneumococcal Conjugate-13 06/24/2014  . Pneumococcal Polysaccharide-23 01/25/2000  . Td 12/27/2000, 03/09/2010  . Tdap 03/09/2010    Past Medical History:  Diagnosis Date  . Adenocarcinoma of right lung ([place]) 01/06/2016  . Anginal chest pain at rest [facility])    Chronic non, controlled on Lorazepam  . Anxiety   . Arthritis    "all over"   . BPH (benign prostatic hyperplasia)   . CAD (coronary artery disease)   . Chronic lower back pain   . Complication of anesthesia    "problems making water afterwards"  . Depression   . DJD (degenerative joint disease)   . Esophageal ulcer without bleeding    bid ppi indefinitely  . Family history of adverse reaction to anesthesia    "children w/PONV"  . GERD (gastroesophageal reflux disease)   . Headache    "couple/week maybe" (09/04/2015)  . Hemochromatosis    Possible, elevated iron stores, hook-like osteophytes on hand films, normal LFTs  . Hepatitis    "yellow jaundice as a baby"  . History of ASCVD    MULTIVESSEL  . Hyperlipidemia   . Hypertension   . Myocardial infarction ([place]) 1999  . Osteoarthritis   . Pneumonia    "several times; got a little now" (09/04/2015)  . Rheumatoid arthritis ([place])   . Subdural hematoma ([facility])    small after fall 09/2016-plavix held, neurosurgery consulted.  Asymptomatic.      Tobacco History: History  Smoking Status  . Never Smoker  Smokeless Tobacco  . Former Systems developer  . Types: Chew    Comment: "quit chewing in the 1960s"   Counseling given: Not Answered   Outpatient Encounter Prescriptions as of 11/09/2016  Medication Sig  . acetaminophen (TYLENOL) 500 MG tablet Take 500 mg by mouth every 6 (six) hours as needed for headache (pain).  Marland Kitchen  amoxicillin (AMOXIL) 500 MG capsule Take 2,000 mg by mouth See admin instructions. Take 4 capsules (2000 mg) by mouth one hour prior to dental appointments  . atenolol (TENORMIN) 50 MG tablet Take 1 tablet (50 mg total) by mouth daily. Please call and schedule follow up. [phone]  . Cholecalciferol 2000 units TABS Take 1 tablet (2,000 Units total) by mouth daily.  . clopidogrel (PLAVIX) 75 MG tablet DO NOT RESUME TAKING THIS MEDICINE UNTILFRIDAY, AUGUST 12. THEN RESUME TAKING ONA DAILY BASIS.  Marland Kitchen Cyanocobalamin (VITAMIN B-12) 2500 MCG SUBL Place 2,500 mcg under the tongue daily.  Marland Kitchen FLUoxetine (PROZAC) 20 MG capsule TAKE ONE CAPSULE BY MOUTH DAILY  . HYDROcodone-acetaminophen (NORCO/VICODIN) 5-325 MG tablet Take 1 tablet by mouth as needed.  . leflunomide (ARAVA) 10 MG tablet Take 10 mg by mouth daily.   Marland Kitchen Lifitegrast (XIIDRA) 5 % SOLN Place 1-2 drops into both eyes daily.  Marland Kitchen LORazepam (ATIVAN) 1 MG tablet TAKE ONE (1) TABLET BY MOUTH 3 TIMES DAILY (TAKE AT EIGHT IN THE MORNING NOONAND FOUR PM)  . nitroGLYCERIN (NITROSTAT) 0.4 MG SL tablet Place 1 tablet (0.4 mg total) under the tongue every 5 (five) minutes as needed for chest pain.  . pantoprazole (PROTONIX) 40 MG tablet Take 1 tablet (40 mg total) by mouth daily.  . rosuvastatin (CRESTOR) 5 MG tablet TAKE 1 TABLET EVERY  DAY  . Spacer/Aero-Holding Chambers (AEROCHAMBER PLUS WITH MASK) inhaler Use as instructed  . tamsulosin (FLOMAX) 0.4 MG CAPS capsule TAKE 1 CAPSULE EVERY DAY  . albuterol (VENTOLIN HFA) 108 (90 Base) MCG/ACT inhaler Inhale 1-2 puffs into the lungs every 6 (six) hours as needed for wheezing or shortness of breath. (Patient not taking: Reported on 11/09/2016)  . [DISCONTINUED] cefUROXime (CEFTIN) 500 MG tablet Take 1 tablet (500 mg total) by mouth 2 (two) times daily with a meal. (Patient not taking: Reported on 11/09/2016)   No facility-administered encounter medications on file as of 11/09/2016.      Review of  Systems  Constitutional:   No  weight loss, night sweats,  Fevers, chills, + fatigue, or  lassitude.  HEENT:   No headaches,  Difficulty swallowing,  Tooth/dental problems, or  Sore throat,                No sneezing, itching, ear ache, nasal congestion, post nasal drip,   CV:  No chest pain,  Orthopnea, PND, swelling in lower extremities, anasarca, dizziness, palpitations, syncope.   GI  No heartburn, indigestion, abdominal pain, nausea, vomiting, diarrhea, change in bowel habits, loss of appetite, bloody stools.   Resp:   No wheezing.  No chest wall deformity  Skin: no rash or lesions.  GU: no dysuria, change in color of urine, no urgency or frequency.  No flank pain, no hematuria   MS:  No joint pain or swelling.  No decreased range of motion.  No back pain.    Physical Exam  BP 124/68 (BP Location: Left Arm, Cuff Size: Normal)   Pulse 72   Temp 97.8 F (36.6 C) (Oral)   Ht _0  (1.727 m)   Wt 171 lb (77.6 kg)   SpO2 97%   BMI 26.00 kg/m   GEN: A/Ox3; pleasant , NAD, elderly , frail    HEENT:  [place]/AT,  EACs-clear, TMs-wnl, NOSE-clear, THROAT-clear, no lesions, no postnasal drip or exudate noted.   NECK:  Supple w/ fair ROM; no JVD; normal carotid impulses w/o bruits; no thyromegaly or nodules palpated; no lymphadenopathy.    RESP  Clear  P & A; w/o, wheezes/ rales/ or rhonchi. no accessory muscle use, no dullness to percussion  CARD:  RRR, no m/r/g, tr  peripheral edema, pulses intact, no cyanosis or clubbing.  GI:   Soft & nt; nml bowel sounds; no organomegaly or masses detected.   Musco: Warm bil, no deformities or joint swelling noted.   Neuro: alert, no focal deficits noted.    Skin: Warm, no lesions or rashes    Lab Results:  CBC    Component Value Date/Time   WBC 6.3 10/03/2016 0910   RBC 4.31 10/03/2016 0910   HGB 13.2 10/03/2016 0910   HGB 13.4 08/16/2016 1155   HCT 39.8 10/03/2016 0910   HCT 39.8 08/16/2016 1155   PLT 98 (L) 10/03/2016 0910    PLT 81 (L) 08/16/2016 1155   MCV 92.3 10/03/2016 0910   MCV 92.3 08/16/2016 1155   MCH 30.6 10/03/2016 0910   MCHC 33.2 10/03/2016 0910   RDW 14.2 10/03/2016 0910   RDW 13.3 08/16/2016 1155   LYMPHSABS 1,071 10/03/2016 0910   LYMPHSABS 1.3 08/16/2016 1155   MONOABS 1,197 (H) 10/03/2016 0910   MONOABS 1.2 (H) 08/16/2016 1155   EOSABS 63 10/03/2016 0910   EOSABS 0.1 08/16/2016 1155   BASOSABS 0 10/03/2016 0910   BASOSABS 0.0 08/16/2016 1155    BMET  Component Value Date/Time   NA 136 10/03/2016 0910   NA 143 08/16/2016 1155   K 3.9 10/03/2016 0910   K 4.3 08/16/2016 1155   CL 105 10/03/2016 0910   CO2 20 10/03/2016 0910   CO2 24 08/16/2016 1155   GLUCOSE 122 (H) 10/03/2016 0910   GLUCOSE 88 08/16/2016 1155   GLUCOSE 120 (H) 04/20/2015 1600   BUN 17 10/03/2016 0910   BUN 14.6 08/16/2016 1155   CREATININE 1.06 10/03/2016 0910   CREATININE 1.2 08/16/2016 1155   CALCIUM 9.0 10/03/2016 0910   CALCIUM 9.0 08/16/2016 1155   GFRNONAA 63 10/03/2016 0910   GFRAA 73 10/03/2016 0910    BNP    Component Value Date/Time   BNP 585.3 (H) 05/20/2016 2345    ProBNP    Component Value Date/Time   PROBNP 648.1 (H) 01/26/2014 0950    Imaging: Dg Chest 2 View  Result Date: 10/11/2016 CLINICAL DATA:  Lower left wall chest pain, history of right lung carcinoma EXAM: CHEST  2 VIEW COMPARISON:  CT chest of 10/04/2016 and chest x-ray of 09/10/2016 FINDINGS: Minimally prominent markings are present in the lung bases some of which appear to be chronic as well as due to atelectasis. A small focus of pneumonia would be difficult to exclude. No pleural effusion is seen. Mediastinal and hilar contours are unremarkable. Cardiomegaly is stable and median sternotomy sutures are noted. The bones are osteopenic. IMPRESSION: 1. Probable scarring at the lung bases.  No definite active process. 2. Stable cardiomegaly Electronically Signed   By: Ivar Drape M.D.   On: 10/11/2016 15:48   Dg Ribs  Unilateral Left  Result Date: 10/11/2016 CLINICAL DATA:  83 y/o  M; anterior lower left rib pain today. EXAM: LEFT RIBS - 2 VIEW COMPARISON:  None. FINDINGS: No fracture or other bone lesions are seen involving the ribs. Aortic atherosclerosis. Surgical clips project over gastroesophageal junction and right upper quadrant. Post median sternotomy. IMPRESSION: No acute fracture or dislocation identified. Electronically Signed   By: Kristine Garbe M.D.   On: 10/11/2016 15:56     Assessment & Plan:   Radiation pneumonitis ([place]) ?radiation Pneumonitis - CT chest shows improvement in July . ESR is low .  Will give low dose steroid trial to see if any improvement  Check BNP  Check ONO as PAH.  No desats on walk test in July  Spirometry today shows some restriction compared to last year.   Plan  Patient Instructions  Begin prednisone 24m daily for 1 week , then 154mdaily for 1 week , then 17m62maily 1 week and stop .  Check ONO At bedtime  .  Labs today .  Follow up with Dr. NesAshok Cordia 4-6 weeks and As needed   Please contact office for sooner follow up if symptoms do not improve or worsen or seek emergency care '        Bran Aldridge, NP 11/09/2016

## 2016-11-09 NOTE — Progress Notes (Signed)
Note reviewed.  Sonia Baller Ashok Cordia, M.D. [facility] Pulmonary & Critical Care Pager:  [phone] After 3pm or if no response, call [phone] 7:46 PM 11/09/16

## 2016-11-09 NOTE — Addendum Note (Signed)
Addended by: Len Blalock on: 11/09/2016 11:07 AM   Modules accepted: Orders

## 2016-11-15 ENCOUNTER — Other Ambulatory Visit: Payer: Self-pay | Admitting: Family Medicine

## 2016-11-15 MED ORDER — CLOPIDOGREL BISULFATE 75 MG PO TABS: 75.0000 mg | ORAL_TABLET | Freq: Every day | ORAL | 1 refills | Status: DC

## 2016-11-15 NOTE — Telephone Encounter (Signed)
Medication refilled per protocol. 

## 2016-11-18 ENCOUNTER — Other Ambulatory Visit: Payer: Self-pay | Admitting: Family Medicine

## 2016-11-21 NOTE — Telephone Encounter (Signed)
ok 

## 2016-11-21 NOTE — Telephone Encounter (Signed)
Ok to refill 

## 2016-11-29 ENCOUNTER — Ambulatory Visit (INDEPENDENT_AMBULATORY_CARE_PROVIDER_SITE_OTHER): Payer: Medicare HMO | Admitting: Family Medicine

## 2016-11-29 ENCOUNTER — Encounter: Payer: Self-pay | Admitting: Family Medicine

## 2016-11-29 VITALS — BP 108/62 | HR 94 | Temp 98.1°F | Resp 14 | Ht 68.0 in | Wt 174.0 lb

## 2016-11-29 DIAGNOSIS — R5382 Chronic fatigue, unspecified: Secondary | ICD-10-CM

## 2016-11-29 LAB — URINALYSIS, ROUTINE W REFLEX MICROSCOPIC
Bilirubin Urine: NEGATIVE
Glucose, UA: NEGATIVE
Hgb urine dipstick: NEGATIVE
Ketones, ur: NEGATIVE
Leukocytes, UA: NEGATIVE
Nitrite: NEGATIVE
Specific Gravity, Urine: 1.02 (ref 1.001–1.035)
pH: 7 (ref 5.0–8.0)

## 2016-11-29 LAB — URINALYSIS, MICROSCOPIC ONLY
Bacteria, UA: NONE SEEN [HPF]
Casts: NONE SEEN [LPF]
Crystals: NONE SEEN [HPF]
RBC / HPF: NONE SEEN RBC/HPF (ref <=2)
Squamous Epithelial / LPF: NONE SEEN [HPF] (ref <=5)
WBC, UA: NONE SEEN WBC/HPF (ref <=5)
Yeast: NONE SEEN [HPF]
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
## 2016-11-30 LAB — COMPLETE METABOLIC PANEL WITH GFR
ALT: 15 U/L (ref 9–46)
AST: 19 U/L (ref 10–35)
Albumin: 3.9 g/dL (ref 3.6–5.1)
Alkaline Phosphatase: 86 U/L (ref 40–115)
BUN: 17 mg/dL (ref 7–25)
CO2: 17 mmol/L — ABNORMAL LOW (ref 20–32)
Calcium: 8.6 mg/dL (ref 8.6–10.3)
Chloride: 106 mmol/L (ref 98–110)
Creat: 1.08 mg/dL (ref 0.70–1.11)
GFR, Est African American: 71 mL/min (ref >=60)
GFR, Est Non African American: 61 mL/min (ref >=60)
Glucose, Bld: 160 mg/dL — ABNORMAL HIGH (ref 70–99)
Potassium: 4 mmol/L (ref 3.5–5.3)
Sodium: 138 mmol/L (ref 135–146)
Total Bilirubin: 1.2 mg/dL (ref 0.2–1.2)
Total Protein: 6.1 g/dL (ref 6.1–8.1)

## 2016-11-30 LAB — VITAMIN B12: Vitamin B-12: 707 pg/mL (ref 200–1100)

## 2016-11-30 LAB — TSH: TSH: 1.56 mIU/L (ref 0.40–4.50)

## 2016-12-01 ENCOUNTER — Other Ambulatory Visit: Payer: Self-pay | Admitting: Family Medicine

## 2016-12-01 DIAGNOSIS — R739 Hyperglycemia, unspecified: Secondary | ICD-10-CM

## 2016-12-02 ENCOUNTER — Other Ambulatory Visit: Payer: Medicare HMO

## 2016-12-02 DIAGNOSIS — R739 Hyperglycemia, unspecified: Secondary | ICD-10-CM

## 2016-12-03 LAB — HEMOGLOBIN A1C
Hgb A1c MFr Bld: 5.3 % (ref <5.7)
Mean Plasma Glucose: 105 mg/dL

## 2016-12-06 ENCOUNTER — Other Ambulatory Visit: Payer: Self-pay | Admitting: Family Medicine

## 2016-12-15 ENCOUNTER — Telehealth: Payer: Self-pay | Admitting: Interventional Cardiology

## 2016-12-15 ENCOUNTER — Other Ambulatory Visit: Payer: Self-pay | Admitting: Family Medicine

## 2016-12-15 ENCOUNTER — Other Ambulatory Visit: Payer: Self-pay | Admitting: Interventional Cardiology

## 2016-12-15 NOTE — Telephone Encounter (Signed)
New Message      *STAT* If patient is at the pharmacy, call can be transferred to refill team.   1. Which medications need to be refilled? (please list name of each medication and dose if known)  rosuvastatin (CRESTOR) 5 MG tablet TAKE 1 TABLET EVERY DAY     2. Which pharmacy/location (including street and city if local pharmacy) is medication to be sent to? [facility]  3. Do they need a 30 day or 90 day supply? Enough to get to appt on October 30th

## 2016-12-15 NOTE — Telephone Encounter (Signed)
Ok to refill 

## 2016-12-16 NOTE — Telephone Encounter (Signed)
ok 

## 2016-12-16 NOTE — Telephone Encounter (Signed)
Rx called in 

## 2016-12-20 ENCOUNTER — Encounter (HOSPITAL_COMMUNITY): Payer: Self-pay | Admitting: *Deleted

## 2016-12-20 ENCOUNTER — Ambulatory Visit: Payer: Medicare HMO | Admitting: Pulmonary Disease

## 2016-12-20 ENCOUNTER — Inpatient Hospital Stay (HOSPITAL_COMMUNITY)
Admission: EM | Admit: 2016-12-20 | Discharge: 2016-12-23 | DRG: 315 | Disposition: A | Discharge status: Home Health | Payer: Medicare HMO | Attending: Internal Medicine | Admitting: Internal Medicine

## 2016-12-20 ENCOUNTER — Emergency Department (HOSPITAL_COMMUNITY): Payer: Medicare HMO

## 2016-12-20 DIAGNOSIS — J8410 Pulmonary fibrosis, unspecified: Secondary | ICD-10-CM | POA: Diagnosis present

## 2016-12-20 DIAGNOSIS — J441 Chronic obstructive pulmonary disease with (acute) exacerbation: Secondary | ICD-10-CM | POA: Diagnosis not present

## 2016-12-20 DIAGNOSIS — I200 Unstable angina: Secondary | ICD-10-CM | POA: Diagnosis not present

## 2016-12-20 DIAGNOSIS — M069 Rheumatoid arthritis, unspecified: Secondary | ICD-10-CM | POA: Diagnosis present

## 2016-12-20 DIAGNOSIS — R05 Cough: Secondary | ICD-10-CM | POA: Diagnosis not present

## 2016-12-20 DIAGNOSIS — Z87891 Personal history of nicotine dependence: Secondary | ICD-10-CM | POA: Diagnosis not present

## 2016-12-20 DIAGNOSIS — C3491 Malignant neoplasm of unspecified part of right bronchus or lung: Secondary | ICD-10-CM | POA: Diagnosis not present

## 2016-12-20 DIAGNOSIS — E785 Hyperlipidemia, unspecified: Secondary | ICD-10-CM | POA: Diagnosis present

## 2016-12-20 DIAGNOSIS — J449 Chronic obstructive pulmonary disease, unspecified: Secondary | ICD-10-CM | POA: Diagnosis not present

## 2016-12-20 DIAGNOSIS — Z8261 Family history of arthritis: Secondary | ICD-10-CM

## 2016-12-20 DIAGNOSIS — Z955 Presence of coronary angioplasty implant and graft: Secondary | ICD-10-CM | POA: Diagnosis not present

## 2016-12-20 DIAGNOSIS — Z23 Encounter for immunization: Secondary | ICD-10-CM

## 2016-12-20 DIAGNOSIS — R071 Chest pain on breathing: Secondary | ICD-10-CM | POA: Diagnosis not present

## 2016-12-20 DIAGNOSIS — I10 Essential (primary) hypertension: Secondary | ICD-10-CM | POA: Diagnosis not present

## 2016-12-20 DIAGNOSIS — I25110 Atherosclerotic heart disease of native coronary artery with unstable angina pectoris: Secondary | ICD-10-CM | POA: Diagnosis not present

## 2016-12-20 DIAGNOSIS — Z923 Personal history of irradiation: Secondary | ICD-10-CM | POA: Diagnosis not present

## 2016-12-20 DIAGNOSIS — Z9842 Cataract extraction status, left eye: Secondary | ICD-10-CM | POA: Diagnosis not present

## 2016-12-20 DIAGNOSIS — Z96653 Presence of artificial knee joint, bilateral: Secondary | ICD-10-CM | POA: Diagnosis present

## 2016-12-20 DIAGNOSIS — I309 Acute pericarditis, unspecified: Secondary | ICD-10-CM | POA: Diagnosis not present

## 2016-12-20 DIAGNOSIS — Z961 Presence of intraocular lens: Secondary | ICD-10-CM | POA: Diagnosis present

## 2016-12-20 DIAGNOSIS — Z9841 Cataract extraction status, right eye: Secondary | ICD-10-CM | POA: Diagnosis not present

## 2016-12-20 DIAGNOSIS — Z951 Presence of aortocoronary bypass graft: Secondary | ICD-10-CM

## 2016-12-20 DIAGNOSIS — Z8719 Personal history of other diseases of the digestive system: Secondary | ICD-10-CM | POA: Diagnosis not present

## 2016-12-20 DIAGNOSIS — Z8249 Family history of ischemic heart disease and other diseases of the circulatory system: Secondary | ICD-10-CM

## 2016-12-20 DIAGNOSIS — I252 Old myocardial infarction: Secondary | ICD-10-CM

## 2016-12-20 DIAGNOSIS — I480 Paroxysmal atrial fibrillation: Secondary | ICD-10-CM | POA: Diagnosis not present

## 2016-12-20 DIAGNOSIS — Z86718 Personal history of other venous thrombosis and embolism: Secondary | ICD-10-CM

## 2016-12-20 DIAGNOSIS — Z888 Allergy status to other drugs, medicaments and biological substances status: Secondary | ICD-10-CM

## 2016-12-20 DIAGNOSIS — K219 Gastro-esophageal reflux disease without esophagitis: Secondary | ICD-10-CM | POA: Diagnosis present

## 2016-12-20 DIAGNOSIS — R079 Chest pain, unspecified: Secondary | ICD-10-CM | POA: Diagnosis not present

## 2016-12-20 DIAGNOSIS — I209 Angina pectoris, unspecified: Secondary | ICD-10-CM | POA: Diagnosis present

## 2016-12-20 DIAGNOSIS — Z881 Allergy status to other antibiotic agents status: Secondary | ICD-10-CM | POA: Diagnosis not present

## 2016-12-20 DIAGNOSIS — Z9049 Acquired absence of other specified parts of digestive tract: Secondary | ICD-10-CM | POA: Diagnosis not present

## 2016-12-20 DIAGNOSIS — I340 Nonrheumatic mitral (valve) insufficiency: Secondary | ICD-10-CM | POA: Diagnosis not present

## 2016-12-20 DIAGNOSIS — Z91040 Latex allergy status: Secondary | ICD-10-CM

## 2016-12-20 DIAGNOSIS — I361 Nonrheumatic tricuspid (valve) insufficiency: Secondary | ICD-10-CM | POA: Diagnosis not present

## 2016-12-20 DIAGNOSIS — I4891 Unspecified atrial fibrillation: Secondary | ICD-10-CM | POA: Diagnosis present

## 2016-12-20 DIAGNOSIS — I4821 Permanent atrial fibrillation: Secondary | ICD-10-CM | POA: Diagnosis present

## 2016-12-20 DIAGNOSIS — R0602 Shortness of breath: Secondary | ICD-10-CM | POA: Diagnosis not present

## 2016-12-20 DIAGNOSIS — Z8679 Personal history of other diseases of the circulatory system: Secondary | ICD-10-CM

## 2016-12-20 LAB — MAGNESIUM: Magnesium: 1.9 mg/dL (ref 1.7–2.4)

## 2016-12-20 LAB — CBC
HCT: 39.9 % (ref 39.0–52.0)
Hemoglobin: 13.7 g/dL (ref 13.0–17.0)
MCH: 31.5 pg (ref 26.0–34.0)
MCHC: 34.3 g/dL (ref 30.0–36.0)
MCV: 91.7 fL (ref 78.0–100.0)
Platelets: 111 10*3/uL — ABNORMAL LOW (ref 150–400)
RBC: 4.35 MIL/uL (ref 4.22–5.81)
RDW: 14 % (ref 11.5–15.5)
WBC: 4.5 10*3/uL (ref 4.0–10.5)

## 2016-12-20 LAB — BASIC METABOLIC PANEL
Anion gap: 7 (ref 5–15)
BUN: 15 mg/dL (ref 6–20)
CO2: 24 mmol/L (ref 22–32)
Calcium: 9.4 mg/dL (ref 8.9–10.3)
Chloride: 108 mmol/L (ref 101–111)
Creatinine, Ser: 1.15 mg/dL (ref 0.61–1.24)
GFR calc Af Amer: >60 mL/min (ref >60)
GFR calc non Af Amer: 55 mL/min — ABNORMAL LOW (ref >60)
Glucose, Bld: 115 mg/dL — ABNORMAL HIGH (ref 65–99)
Potassium: 4.2 mmol/L (ref 3.5–5.1)
Sodium: 139 mmol/L (ref 135–145)

## 2016-12-20 LAB — I-STAT TROPONIN, ED: Troponin i, poc: 0.02 ng/mL (ref 0.00–0.08)

## 2016-12-20 LAB — TROPONIN I
Troponin I: <0.03 ng/mL (ref <0.03)
Troponin I: <0.03 ng/mL (ref <0.03)

## 2016-12-20 LAB — TSH: TSH: 1.003 u[IU]/mL (ref 0.350–4.500)

## 2016-12-20 LAB — T4, FREE: Free T4: 0.76 ng/dL (ref 0.61–1.12)

## 2016-12-20 MED ORDER — NITROGLYCERIN 0.4 MG SL SUBL
0.4000 mg | SUBLINGUAL_TABLET | SUBLINGUAL | Status: AC | PRN
  Administered 2016-12-20 – 2016-12-21 (×3): 0.4 mg via SUBLINGUAL
  Filled 2016-12-20 (×3): qty 1

## 2016-12-20 MED ORDER — TAMSULOSIN HCL 0.4 MG PO CAPS
0.4000 mg | ORAL_CAPSULE | Freq: Every day | ORAL | Status: DC
  Administered 2016-12-20 – 2016-12-23 (×4): 0.4 mg via ORAL
  Filled 2016-12-20 (×4): qty 1

## 2016-12-20 MED ORDER — NITROGLYCERIN 0.4 MG SL SUBL: 0.4000 mg | SUBLINGUAL_TABLET | SUBLINGUAL | Status: DC | PRN

## 2016-12-20 MED ORDER — VITAMIN D 1000 UNITS PO TABS
2000.0000 [IU] | ORAL_TABLET | Freq: Every day | ORAL | Status: DC
Start: 2016-12-21 — End: 2016-12-23
  Administered 2016-12-21 – 2016-12-23 (×3): 2000 [IU] via ORAL
  Filled 2016-12-20 (×5): qty 2

## 2016-12-20 MED ORDER — LORAZEPAM 0.5 MG PO TABS
0.5000 mg | ORAL_TABLET | Freq: Every day | ORAL | Status: DC
  Administered 2016-12-21 – 2016-12-23 (×3): 0.5 mg via ORAL
  Filled 2016-12-20 (×3): qty 1

## 2016-12-20 MED ORDER — ROSUVASTATIN CALCIUM 10 MG PO TABS
5.0000 mg | ORAL_TABLET | Freq: Every day | ORAL | Status: DC
Start: 2016-12-20 — End: 2016-12-20

## 2016-12-20 MED ORDER — ASPIRIN 81 MG PO CHEW
324.0000 mg | CHEWABLE_TABLET | Freq: Once | ORAL | Status: AC
  Administered 2016-12-20: 324 mg via ORAL
  Filled 2016-12-20: qty 4

## 2016-12-20 MED ORDER — PANTOPRAZOLE SODIUM 40 MG PO TBEC
40.0000 mg | DELAYED_RELEASE_TABLET | Freq: Every day | ORAL | Status: DC
  Administered 2016-12-20 – 2016-12-23 (×4): 40 mg via ORAL
  Filled 2016-12-20 (×4): qty 1

## 2016-12-20 MED ORDER — HEPARIN (PORCINE) IN NACL 100-0.45 UNIT/ML-% IJ SOLN
1000.0000 [IU]/h | INTRAMUSCULAR | Status: DC
  Administered 2016-12-20: 1000 [IU]/h via INTRAVENOUS
  Filled 2016-12-20: qty 250

## 2016-12-20 MED ORDER — PANTOPRAZOLE SODIUM 40 MG PO TBEC: 40.0000 mg | DELAYED_RELEASE_TABLET | Freq: Every day | ORAL | Status: DC

## 2016-12-20 MED ORDER — LORAZEPAM 1 MG PO TABS
1.0000 mg | ORAL_TABLET | Freq: Two times a day (BID) | ORAL | Status: DC
  Administered 2016-12-20 – 2016-12-23 (×6): 1 mg via ORAL
  Filled 2016-12-20 (×6): qty 1

## 2016-12-20 MED ORDER — SODIUM CHLORIDE 0.9 % IV SOLN
INTRAVENOUS | Status: DC
  Administered 2016-12-20: 21:00:00 via INTRAVENOUS

## 2016-12-20 MED ORDER — LEFLUNOMIDE 20 MG PO TABS
10.0000 mg | ORAL_TABLET | Freq: Every day | ORAL | Status: DC
  Administered 2016-12-21 – 2016-12-23 (×3): 10 mg via ORAL
  Filled 2016-12-20 (×3): qty 1

## 2016-12-20 MED ORDER — NITROGLYCERIN 2 % TD OINT
1.0000 [in_us] | TOPICAL_OINTMENT | Freq: Four times a day (QID) | TRANSDERMAL | Status: DC
  Administered 2016-12-20 – 2016-12-21 (×3): 1 [in_us] via TOPICAL
  Filled 2016-12-20 (×14): qty 30
  Filled 2016-12-20: qty 1
  Filled 2016-12-20 (×16): qty 30

## 2016-12-20 MED ORDER — FLUOXETINE HCL 20 MG PO CAPS
20.0000 mg | ORAL_CAPSULE | Freq: Every day | ORAL | Status: DC
Start: 2016-12-20 — End: 2016-12-20

## 2016-12-20 MED ORDER — ROSUVASTATIN CALCIUM 10 MG PO TABS
5.0000 mg | ORAL_TABLET | Freq: Every day | ORAL | Status: DC
  Administered 2016-12-20 – 2016-12-23 (×4): 5 mg via ORAL
  Filled 2016-12-20 (×4): qty 1

## 2016-12-20 MED ORDER — ACETAMINOPHEN 325 MG PO TABS
650.0000 mg | ORAL_TABLET | ORAL | Status: DC | PRN
  Administered 2016-12-20 – 2016-12-22 (×3): 650 mg via ORAL
  Filled 2016-12-20 (×4): qty 2

## 2016-12-20 MED ORDER — ONDANSETRON HCL 4 MG/2ML IJ SOLN: 4.0000 mg | Freq: Four times a day (QID) | INTRAMUSCULAR | Status: DC | PRN

## 2016-12-20 MED ORDER — FLUOXETINE HCL 20 MG PO CAPS
20.0000 mg | ORAL_CAPSULE | Freq: Every day | ORAL | Status: DC
  Administered 2016-12-20 – 2016-12-23 (×4): 20 mg via ORAL
  Filled 2016-12-20 (×4): qty 1

## 2016-12-20 MED ORDER — TAMSULOSIN HCL 0.4 MG PO CAPS: 0.4000 mg | ORAL_CAPSULE | Freq: Every day | ORAL | Status: DC

## 2016-12-20 MED ORDER — ATENOLOL 25 MG PO TABS
50.0000 mg | ORAL_TABLET | Freq: Every day | ORAL | Status: DC
Start: 2016-12-20 — End: 2016-12-21
  Administered 2016-12-20 – 2016-12-21 (×2): 50 mg via ORAL
  Filled 2016-12-20 (×2): qty 2
  Filled 2016-12-20: qty 1

## 2016-12-20 MED ORDER — VITAMIN B-12 1000 MCG PO TABS
500.0000 ug | ORAL_TABLET | Freq: Every day | ORAL | Status: DC
  Administered 2016-12-21 – 2016-12-23 (×3): 500 ug via ORAL
  Filled 2016-12-20 (×5): qty 1

## 2016-12-20 MED ORDER — HEPARIN BOLUS VIA INFUSION
4000.0000 [IU] | Freq: Once | INTRAVENOUS | Status: AC
  Administered 2016-12-20: 4000 [IU] via INTRAVENOUS
  Filled 2016-12-20: qty 4000

## 2016-12-20 MED ORDER — POLYVINYL ALCOHOL 1.4 % OP SOLN
2.0000 [drp] | OPHTHALMIC | Status: DC | PRN
  Filled 2016-12-20: qty 15

## 2016-12-20 MED ORDER — LIFITEGRAST 5 % OP SOLN: 1.0000 [drp] | Freq: Every day | OPHTHALMIC | Status: DC

## 2016-12-20 MED ORDER — ASPIRIN EC 81 MG PO TBEC
81.0000 mg | DELAYED_RELEASE_TABLET | Freq: Every day | ORAL | Status: DC
  Administered 2016-12-21: 81 mg via ORAL
  Filled 2016-12-20: qty 1

## 2016-12-20 NOTE — ED Triage Notes (Signed)
To ED for eval of chest tightness since last night. Describes as a pressure. Pt denies sob. Skin w/d.

## 2016-12-20 NOTE — H&P (Signed)
Cardiology Admission History and Physical:   Patient ID: Evan GAGLIO; MRN: [id number]; DOB: Oct 07, 1927   Admission date: 12/20/2016  Primary Care Provider: [name], MD Primary Cardiologist: Dr. Irish Lack  Primary Electrophysiologist:  NA  Chief Complaint:  Chest pain  Patient Profile:   Evan [name] is a 83 y.o. male with a history of with CAD. CABG in 1998, stent in 2008. He had a normal stress test in 2012. And again in 2017.  Other hx of HTN, HLD, RA, GERD, subdural hematoma in 09/2016  And adenocarcinoma of Rt lung.    History of Present Illness:   [name] with CAD and CABG in 1998 right radial free graft to the OM and a LIMA to the LAD, hx of stent to occluded LCX, extending into 2 large OMs in 2008.  Also hx of of stent implantation of the ostium of the rt radial free graft both with DES.  Last cath 2009 with EF 75-80%,  Patent stents to LCX and ostial of rt radial free graft to marginal.  LIMA graft to LAD was patent, 20-30% tubular stenosis in mid portion.  The first  marginal does have a diffuse 80% stenosis and retrograde flow into the graft is noted.  Last nuc in 09/2015 with no ischemia, or infarction, EF 57%.  Last Echo 05/2016 EF 60-65%, mild MR, mild AS.  LA ws moderately to severely dilated.  PA PK pressure 52 mmHg   Pt also with lung cancer Stage IA (T1b, N0, M0) non-small cell lung cancer and underwent radiation ending 02/22/16.    Today pt. Presented to ER with chest pressure.  Family stated it began last night but pt does not remember at this time, but when he woke he had chest pressure substernal and radiated to Lt ant chest.  No nausea, diaphoresis but+ SOB.  The pressure comes and goes.  Rates it a 2/10.   No palpitations, no awareness of rapid HR, no colds or fevers.  No syncope.   EKG a flutter/fib rate 88 with ST depression in inf lat leads. Personally reviewed. Though in a fib he has these depressions since June    Pt went into a fib 05/2016 in setting of  fevers/chills, cough but he converted to SR  CHADSVASC score is 4 pt was maintained on Plavix with plan if recurrence to stop plavix and use NOAC.   In 09/2016 pt was back in a fib though difficult to tell on some tracings.   Again with PNA at that time, but with pt not knowing when he is in a fib may have been in a fib since June this year.    Troponin 0.02 follow up troponins pending. Na 139 K+ 4.2, BUN 15, Cr 1.15  Hgb 13.7, hct 39.9 plts 111 CXR No acute cardiopulmonary findings. Chronic bronchitic type interstitial lung changes and scarring.    Past Medical History:  Diagnosis Date  . Adenocarcinoma of right lung ([place]) 01/06/2016  . [facility]inal chest [facility] at [facility])    Chronic non, controlled on Lorazepam  . Anxiety   . Arthritis    "all over"   . BPH (benign prostatic hyperplasia)   . CAD (coronary artery disease)   . Chronic lower back pain   . Complication of anesthesia    "problems making water afterwards"  . Depression   . DJD (degenerative joint disease)   . Esophageal ulcer without bleeding    bid ppi indefinitely  . Family history  of adverse reaction to anesthesia    "children w/PONV"  . GERD (gastroesophageal reflux disease)   . Headache    "couple/week maybe" (09/04/2015)  . Hemochromatosis    Possible, elevated iron stores, hook-like osteophytes on hand films, normal LFTs  . Hepatitis    "yellow jaundice as a baby"  . History of ASCVD    MULTIVESSEL  . Hyperlipidemia   . Hypertension   . Myocardial infarction ([place]) 1999  . Osteoarthritis   . Pneumonia    "several times; got a little now" (09/04/2015)  . Rheumatoid arthritis ([place])   . Subdural hematoma ([facility])    small after fall 09/2016-plavix held, neurosurgery consulted.  Asymptomatic.      Past Surgical History:  Procedure Laterality Date  . ARM SKIN LESION BIOPSY / EXCISION Left 09/02/2015  . CATARACT EXTRACTION W/ INTRAOCULAR LENS  IMPLANT, BILATERAL Bilateral   . CHOLECYSTECTOMY OPEN    . CORNEAL  TRANSPLANT Bilateral    "one at [facility]; one [facility]  . CORONARY ANGIOPLASTY WITH STENT PLACEMENT    . CORONARY ARTERY BYPASS GRAFT  1999  . DESCENDING AORTIC ANEURYSM REPAIR W/ STENT     DE stent ostium into the right radial free graft at OM-, 02-2007  . ESOPHAGOGASTRODUODENOSCOPY N/A 11/09/2014   Procedure: ESOPHAGOGASTRODUODENOSCOPY (EGD);  Surgeon: [name], MD;  Location: [facility] ENDOSCOPY;  Service: Endoscopy;  Laterality: N/A;  . INGUINAL HERNIA REPAIR    . JOINT REPLACEMENT    . NASAL SINUS SURGERY    . SHOULDER OPEN ROTATOR CUFF REPAIR Right   . TOTAL KNEE ARTHROPLASTY Bilateral      Medications Prior to Admission: Prior to Admission medications   Medication Sig Start Date End Date Taking? Authorizing Provider  albuterol (VENTOLIN HFA) 108 (90 Base) MCG/ACT inhaler Inhale 1-2 puffs into the lungs every 6 (six) hours as needed for wheezing or shortness of breath. 05/26/16  Yes [name], MD  amoxicillin (AMOXIL) 500 MG capsule Take 2,000 mg by mouth See admin instructions. Take 4 capsules (2000 mg) by mouth one hour prior to dental appointments   Yes [provider]  atenolol (TENORMIN) 50 MG tablet Take 1 tablet (50 mg total) by mouth daily. Please call and schedule follow up. [phone] Patient taking differently: Take 25 mg by mouth daily. Please call and schedule follow up. [phone] 10/17/16  Yes Jettie Booze, MD  Cholecalciferol 2000 units TABS Take 1 tablet (2,000 Units total) by mouth daily. 02/04/16  Yes [name], MD  clopidogrel (PLAVIX) 75 MG tablet Take 1 tablet (75 mg total) by mouth daily. 11/15/16  Yes [name], MD  Cyanocobalamin (VITAMIN B-12 PO) Take 1 tablet by mouth daily.   Yes [provider]  FLUoxetine (PROZAC) 20 MG capsule TAKE ONE CAPSULE BY MOUTH DAILY Patient taking differently: TAKE 20 mg  BY MOUTH DAILY 07/11/16  Yes [name], MD  leflunomide (ARAVA) 10 MG tablet Take 10 mg by mouth daily.  08/01/15   Yes [provider]  Lifitegrast Shirley Friar) 5 % SOLN Place 1-2 drops into both eyes daily.   Yes [provider]  LORazepam (ATIVAN) 1 MG tablet TAKE ONE (1) TABLET BY MOUTH 3 TIMES DAILY Patient taking differently: Take 1mg  by mouth every morning; Take 0.5 mg at lunch; Take 1mg  at bedtime 12/16/16  Yes [name], MD  nitroGLYCERIN (NITROSTAT) 0.4 MG SL tablet Place 1 tablet (0.4 mg total) under the tongue every 5 (five) minutes as needed for  chest pain. 07/14/15  Yes Jettie Booze, MD  pantoprazole (PROTONIX) 40 MG tablet Take 1 tablet (40 mg total) by mouth daily. 11/02/15  Yes Jettie Booze, MD  rosuvastatin (CRESTOR) 5 MG tablet TAKE 1 TABLET EVERY DAY(NEED TO SCHEDULE YEARLY APPOINTMENT FOR AUGUST ) Patient taking differently: TAKE 5 mg  EVERY DAY(NEED TO SCHEDULE YEARLY APPOINTMENT FOR AUGUST ) 12/15/16  Yes Jettie Booze, MD  tamsulosin (FLOMAX) 0.4 MG CAPS capsule TAKE 1 CAPSULE EVERY DAY Patient taking differently: TAKE 0.4 gm by mouth EVERY DAY 07/18/16  Yes [name], MD  Spacer/Aero-Holding Chambers (AEROCHAMBER PLUS WITH MASK) inhaler Use as instructed 10/19/15   Charlesetta Shanks, MD     Allergies:    Allergies  Allergen Reactions  . Feldene [Piroxicam] Other (See Comments)    REACTION: Blisters  . Statins Other (See Comments)    Myalgias  . Doxycycline Other (See Comments)    REACTION:  unknown  . Latex Rash  . Tequin Anxiety  . Valium Other (See Comments)    REACTION:  unknown    Social History:   Social History   Social History  . Marital status: Widowed    Spouse name: N/A  . Number of children: N/A  . Years of education: N/A   Occupational History  . Not on file.   Social History Main Topics  . Smoking status: Never Smoker  . Smokeless tobacco: Former Systems developer    Types: Chew     Comment: "quit chewing in the 1960s"  . Alcohol use No  . Drug use: No  . Sexual activity: No   Other Topics Concern  . Not on file    Social History Narrative  . No narrative on file    Family History:   The patient's family history includes Arthritis-Osteo in his sister; Heart attack in his brother and other. There is no history of Cancer.    ROS:  Please see the history of present illness.  General:no colds or fevers, no weight changes Skin:no rashes or ulcers HEENT:no blurred vision, no congestion CV:see HPI PUL:see HPI GI:no diarrhea constipation or melena, no indigestion GU:no hematuria, no dysuria MS:no joint pain, no claudication Neuro:no syncope, no lightheadedness Endo:no diabetes, no thyroid disease      Physical Exam/Data:   Vitals:   12/20/16 1300 12/20/16 1401 12/20/16 1430 12/20/16 1445  BP: (!) 172/93  (!) 177/101 (!) 161/86  Pulse: (!) 58  (!) 54 (!) 54  Resp: 20  16 14   Temp:  97.8 F (36.6 C)    SpO2: 99%  97% 100%   No intake or output data in the 24 hours ending 12/20/16 1513 There were no vitals filed for this visit. There is no height or weight on file to calculate BMI.  General:  Frail white male in no acute distress but with episodic chest pressure HEENT: normal Lymph: no adenopathy Neck: no JVD Endocrine:  No thryomegaly Vascular: No carotid bruits; 1+ pedal pulses bil Cardiac:  normal S1, S2; RRR; no murmur gallup rub or click Lungs:  clear to auscultation bilaterally, no wheezing, rhonchi or rales  Abd: soft, nontender, no hepatomegaly  Ext: no lower ext edema Musculoskeletal:  No deformities, BUE and BLE strength normal and equal Skin: warm and dry  Neuro:  A&O X 3 MAE follows commands + facial symmetry  Psych:  Normal affect      Relevant CV Studies: ECHO 05/23/16 ------------------------------------------------------------------- Study Conclusions  - Left ventricle: The cavity size  was normal. There was moderate   concentric hypertrophy. Systolic function was normal. The   estimated ejection fraction was in the range of 60% to 65%. Left   ventricular  diastolic function parameters were normal. - Aortic valve: There was mild stenosis. Valve area (VTI): 2.51   cm^2. Valve area (Vmax): 1.99 cm^2. Valve area (Vmean): 2.28   cm^2. - Mitral valve: There was mild regurgitation. - Left atrium: The appendage was moderately to severely dilated. - Atrial septum: No defect or patent foramen ovale was identified. - Pulmonary arteries: PA peak pressure: 52 mm Hg (S).   Nuc study 09/07/15  COMPARISON:  None.  FINDINGS: Perfusion: No decreased activity in the left ventricle on stress imaging to suggest reversible ischemia or infarction.  Wall Motion: Normal left ventricular wall motion. No left ventricular dilation.  Left Ventricular Ejection Fraction: 57 %  End diastolic volume 57 ml  End systolic volume 25 ml  IMPRESSION: 1. No reversible ischemia or infarction.  2. Normal left ventricular wall motion.  3. Left ventricular ejection fraction 57%  4. Non invasive risk stratification*: Low risk  Laboratory Data:  Chemistry Recent Labs Lab 12/20/16 0827  NA 139  K 4.2  CL 108  CO2 24  GLUCOSE 115*  BUN 15  CREATININE 1.15  CALCIUM 9.4  GFRNONAA 55*  GFRAA >60  ANIONGAP 7    No results for input(s): PROT, ALBUMIN, AST, ALT, ALKPHOS, BILITOT in the last 168 hours. Hematology Recent Labs Lab 12/20/16 0827  WBC 4.5  RBC 4.35  HGB 13.7  HCT 39.9  MCV 91.7  MCH 31.5  MCHC 34.3  RDW 14.0  PLT 111*   Cardiac EnzymesNo results for input(s): TROPONINI in the last 168 hours.  Recent Labs Lab 12/20/16 0844  TROPIPOC 0.02    BNPNo results for input(s): BNP, PROBNP in the last 168 hours.  DDimer No results for input(s): DDIMER in the last 168 hours.  Radiology/Studies:  Dg Chest 2 View  Result Date: 12/20/2016 CLINICAL DATA:  Chest pain and shortness of breath. EXAM: CHEST  2 VIEW COMPARISON:  10/11/2016 FINDINGS: The heart is normal in size and stable. Stable tortuosity and calcification of the thoracic  aorta. Stable surgical changes from bypass surgery. Chronic bronchitic type interstitial lung changes and pulmonary scarring. No infiltrates, edema or effusions. The bony thorax is intact. IMPRESSION: No acute cardiopulmonary findings. Chronic bronchitic type interstitial lung changes and scarring. Electronically Signed   By: Marijo Sanes M.D.   On: 12/20/2016 09:03    Assessment and Plan:   1. Chest pain with hx of CABG X 2 in 1998 and last cath 2009 with patent stents and patent grafts,  radiation to chest last year for lung cancer.  Add NTG paste for now.  Serial troponins. Last nuc was > 1 year ago ? Repeat vs. Cath.  Though HTN and a fib could be adding to the pain. Diona Fanti has been given  2. A fib new this year began with PNA and flu and returned with PNA.  May have been present since June.  Pt not aware.  cha2ds2vasc score of 4 stop plavix and add noac.  Though for now may need IV heparin until troponins are back. 3.  HTN ,here elevated atenolol 50 has been given.and 1 NTG sl I am adding NTG paste but may need IV NTG 4.  CAD see above hx CABG in 1998 5. HLD on statin crestor 5 mg   Check lipids in AM  6.  Was on atenolol 25 due to low BP as outpt.   Severity of Illness: The appropriate patient status for this patient is INPATIENT. Inpatient status is judged to be reasonable and necessary in order to provide the required intensity of service to ensure the patient's safety. The patient's presenting symptoms, physical exam findings, and initial radiographic and laboratory data in the context of their chronic comorbidities is felt to place them at high risk for further clinical deterioration. Furthermore, it is not anticipated that the patient will be medically stable for discharge from the hospital within 2 midnights of admission. The following factors support the patient status of inpatient.   " The patient's presenting symptoms include unstable angina, a flutter, HTN. " The worrisome  physical exam findings include a flutter . " The initial radiographic and laboratory data are worrisome because of abnormal EKG. " The chronic co-morbidities include CAD with CABG 20 years agao.   * I certify that at the point of admission it is my clinical judgment that the patient will require inpatient hospital care spanning beyond 2 midnights from the point of admission due to high intensity of service, high risk for further deterioration and high frequency of surveillance required.*    For questions or updates, please contact [place] Please consult [email or web address] for contact info under Cardiology/STEMI.    Signed, Cecilie Kicks, NP  12/20/2016 3:13 PM

## 2016-12-20 NOTE — ED Notes (Signed)
Spoke with EDP regarding plan for patient. Currently waiting for Cardiology for bedside consult.

## 2016-12-20 NOTE — ED Provider Notes (Signed)
[place] DEPT Provider Note   CSN: [id number] Arrival date & time: 12/20/16  1856     History   Chief Complaint Chief Complaint  Patient presents with  . Chest Pain    HPI Evan [name] is a 83 y.o. male.  83 yo M the chief complaint of chest pain. The patient woke up with this yesterday and then again this morning. Seems to improve with rest is worse with exertion. Has some shortness of breath with it. Denies nausea or vomiting. Denies radiation of the pain. Describes it as a squeezing pressure worse to the substernal area. Mild radiation to the left side of his chest. Denies lower extremity edema denies hemoptysis denies prior PE or DVT. History of bypass surgery as well as a stent. He thinks this feels slightly different but is unable to quantify this.   The history is provided by the patient.  Chest Pain   This is a recurrent problem. The current episode started yesterday. The problem occurs constantly. The problem has not changed since onset.The pain is associated with exertion. The pain is present in the substernal region and lateral region. The pain is at a severity of 5/10. The pain is moderate. The quality of the pain is described as brief and exertional. The pain does not radiate. Duration of episode(s) is 2 hours. Associated symptoms include shortness of breath and weakness. Pertinent negatives include no abdominal pain, no fever, no headaches, no palpitations and no vomiting. He has tried rest for the symptoms. The treatment provided moderate relief.  His past medical history is significant for CAD, hyperlipidemia, hypertension and MI.    Past Medical History:  Diagnosis Date  . Adenocarcinoma of right lung ([place]) 01/06/2016  . Anginal chest pain at rest [facility])    Chronic non, controlled on Lorazepam  . Anxiety   . Arthritis    "all over"   . BPH (benign prostatic hyperplasia)   . CAD (coronary artery disease)   . Chronic lower back pain   . Complication of  anesthesia    "problems making water afterwards"  . Depression   . DJD (degenerative joint disease)   . Esophageal ulcer without bleeding    bid ppi indefinitely  . Family history of adverse reaction to anesthesia    "children w/PONV"  . GERD (gastroesophageal reflux disease)   . Headache    "couple/week maybe" (09/04/2015)  . Hemochromatosis    Possible, elevated iron stores, hook-like osteophytes on hand films, normal LFTs  . Hepatitis    "yellow jaundice as a baby"  . History of ASCVD    MULTIVESSEL  . Hyperlipidemia   . Hypertension   . Myocardial infarction ([place]) 1999  . Osteoarthritis   . Pneumonia    "several times; got a little now" (09/04/2015)  . Rheumatoid arthritis ([place])   . Subdural hematoma ([facility])    small after fall 09/2016-plavix held, neurosurgery consulted.  Asymptomatic.      Patient Active Problem List   Diagnosis Date Noted  . Radiation pneumonitis ([place]) 11/09/2016  . Pleurisy 10/11/2016  . Chest wall pain 10/11/2016  . Atrial fibrillation ([place]) 05/21/2016  . Adenocarcinoma of right lung ([place]) 01/06/2016  . GAD (generalized anxiety disorder) 11/20/2015  . Essential hypertension   . Cellulitis 09/04/2015  . Cellulitis of left axilla   . Esophageal ulcer without bleeding   . Bilateral lower extremity edema 04/28/2014  . BPH (benign prostatic hyperplasia) 10/08/2012  . Anxiety   . EKG abnormalities 09/05/2012  .  Tremor 09/05/2012  . Chronic fatigue 09/05/2012  . Arthritis   . Asthma, chronic   . Reflux esophagitis   . S/P CABG (coronary artery bypass graft)   . GERD (gastroesophageal reflux disease)   . Hyperlipidemia   . Hypertension   . UNSPECIFIED PERIPHERAL VASCULAR DISEASE 06/16/2009    Past Surgical History:  Procedure Laterality Date  . ARM SKIN LESION BIOPSY / EXCISION Left 09/02/2015  . CATARACT EXTRACTION W/ INTRAOCULAR LENS  IMPLANT, BILATERAL Bilateral   . CHOLECYSTECTOMY OPEN    . CORNEAL TRANSPLANT Bilateral    "one at [facility]; one  at [facility]"  . CORONARY ANGIOPLASTY WITH STENT PLACEMENT    . CORONARY ARTERY BYPASS GRAFT  1999  . DESCENDING AORTIC ANEURYSM REPAIR W/ STENT     DE stent ostium into the right radial free graft at OM-, 02-2007  . ESOPHAGOGASTRODUODENOSCOPY N/A 11/09/2014   Procedure: ESOPHAGOGASTRODUODENOSCOPY (EGD);  Surgeon: [name], MD;  Location[facility] ENDOSCOPY;  Service: Endoscopy;  Laterality: N/A;  . INGUINAL HERNIA REPAIR    . JOINT REPLACEMENT    . NASAL SINUS SURGERY    . SHOULDER OPEN ROTATOR CUFF REPAIR Right   . TOTAL KNEE ARTHROPLASTY Bilateral        Home Medications    Prior to Admission medications   Medication Sig Start Date End Date Taking? Authorizing Provider  albuterol (VENTOLIN HFA) 108 (90 Base) MCG/ACT inhaler Inhale 1-2 puffs into the lungs every 6 (six) hours as needed for wheezing or shortness of breath. 05/26/16  Yes [name], MD  amoxicillin (AMOXIL) 500 MG capsule Take 2,000 mg by mouth See admin instructions. Take 4 capsules (2000 mg) by mouth one hour prior to dental appointments   Yes [provider]  atenolol (TENORMIN) 50 MG tablet Take 1 tablet (50 mg total) by mouth daily. Please call and schedule follow up. [phone] Patient taking differently: Take 25 mg by mouth daily. Please call and schedule follow up. [phone] 10/17/16  Yes Jettie Booze, MD  Cholecalciferol 2000 units TABS Take 1 tablet (2,000 Units total) by mouth daily. 02/04/16  Yes [name], MD  clopidogrel (PLAVIX) 75 MG tablet Take 1 tablet (75 mg total) by mouth daily. 11/15/16  Yes [name], MD  Cyanocobalamin (VITAMIN B-12 PO) Take 1 tablet by mouth daily.   Yes [provider]  FLUoxetine (PROZAC) 20 MG capsule TAKE ONE CAPSULE BY MOUTH DAILY Patient taking differently: TAKE 20 mg  BY MOUTH DAILY 07/11/16  Yes [name], MD  leflunomide (ARAVA) 10 MG tablet Take 10 mg by mouth daily.  08/01/15  Yes [provider]  Lifitegrast  Shirley Friar) 5 % SOLN Place 1-2 drops into both eyes daily.   Yes [provider]  LORazepam (ATIVAN) 1 MG tablet TAKE ONE (1) TABLET BY MOUTH 3 TIMES DAILY Patient taking differently: Take 1mg  by mouth every morning; Take 0.5 mg at lunch; Take 1mg  at bedtime 12/16/16  Yes Pickard, Cammie Mcgee, MD  nitroGLYCERIN (NITROSTAT) 0.4 MG SL tablet Place 1 tablet (0.4 mg total) under the tongue every 5 (five) minutes as needed for chest pain. 07/14/15  Yes Jettie Booze, MD  pantoprazole (PROTONIX) 40 MG tablet Take 1 tablet (40 mg total) by mouth daily. 11/02/15  Yes Jettie Booze, MD  rosuvastatin (CRESTOR) 5 MG tablet TAKE 1 TABLET EVERY DAY(NEED TO SCHEDULE YEARLY APPOINTMENT FOR AUGUST ) Patient taking differently: TAKE 5 mg  EVERY DAY(NEED TO SCHEDULE YEARLY APPOINTMENT FOR AUGUST )  12/15/16  Yes Jettie Booze, MD  tamsulosin (FLOMAX) 0.4 MG CAPS capsule TAKE 1 CAPSULE EVERY DAY Patient taking differently: TAKE 0.4 gm by mouth EVERY DAY 07/18/16  Yes [name], MD  Spacer/Aero-Holding Chambers (AEROCHAMBER PLUS WITH MASK) inhaler Use as instructed 10/19/15   Charlesetta Shanks, MD    Family History Family History  Problem Relation Age of Onset  . Arthritis-Osteo Sister   . Heart attack Brother   . Heart attack Other   . Cancer Neg Hx     Social History Social History  Substance Use Topics  . Smoking status: Never Smoker  . Smokeless tobacco: Former Systems developer    Types: Chew     Comment: "quit chewing in the 1960s"  . Alcohol use No     Allergies   Feldene [piroxicam]; Statins; Doxycycline; Latex; Tequin; and Valium   Review of Systems Review of Systems  Constitutional: Negative for chills and fever.  HENT: Negative for congestion and facial swelling.   Eyes: Negative for discharge and visual disturbance.  Respiratory: Positive for shortness of breath.   Cardiovascular: Positive for chest pain. Negative for palpitations.  Gastrointestinal: Negative for  abdominal pain, diarrhea and vomiting.  Musculoskeletal: Negative for arthralgias and myalgias.  Skin: Negative for color change and rash.  Neurological: Positive for weakness. Negative for tremors, syncope and headaches.  Psychiatric/Behavioral: Negative for confusion and dysphoric mood.     Physical Exam Updated Vital Signs BP (!) 161/86   Pulse (!) 54   Temp 97.8 F (36.6 C)   Resp 14   SpO2 100%   Physical Exam  Constitutional: He is oriented to person, place, and time. He appears well-developed and well-nourished.  HENT:  Head: Normocephalic and atraumatic.  Eyes: Pupils are equal, round, and reactive to light. EOM are normal.  Neck: Normal range of motion. Neck supple. No JVD present.  Cardiovascular: Normal rate and regular rhythm.  Exam reveals no gallop and no friction rub.   No murmur heard. Pulmonary/Chest: No respiratory distress. He has no wheezes.  Abdominal: He exhibits no distension and no mass. There is no tenderness. There is no rebound and no guarding.  Musculoskeletal: Normal range of motion.  Neurological: He is alert and oriented to person, place, and time.  Skin: No rash noted. No pallor.  Psychiatric: He has a normal mood and affect. His behavior is normal.  Nursing note and vitals reviewed.    ED Treatments / Results  Labs (all labs ordered are listed, but only abnormal results are displayed) Labs Reviewed  BASIC METABOLIC PANEL - Abnormal; Notable for the following:       Result Value   Glucose, Bld 115 (*)    GFR calc non Af Amer 55 (*)    All other components within normal limits  CBC - Abnormal; Notable for the following:    Platelets 111 (*)    All other components within normal limits  TROPONIN I  TROPONIN I  TROPONIN I  MAGNESIUM  TSH  T4, FREE  I-STAT TROPONIN, ED    EKG  EKG Interpretation  Date/Time:  Tuesday December 20 2016 08:19:29 EDT Ventricular Rate:  88 PR Interval:    QRS Duration: 92 QT Interval:  434 QTC  Calculation: 525 R Axis:   -2 Text Interpretation:  Normal sinus rhythm Moderate voltage criteria for LVH, may be normal variant Nonspecific ST and T wave abnormality Prolonged QT Abnormal ECG downsloping st segments seen on prior Otherwise no significant change Confirmed  by Deno Etienne [phone]) on 12/20/2016 9:08:24 AM       Radiology Dg Chest 2 View  Result Date: 12/20/2016 CLINICAL DATA:  Chest pain and shortness of breath. EXAM: CHEST  2 VIEW COMPARISON:  10/11/2016 FINDINGS: The heart is normal in size and stable. Stable tortuosity and calcification of the thoracic aorta. Stable surgical changes from bypass surgery. Chronic bronchitic type interstitial lung changes and pulmonary scarring. No infiltrates, edema or effusions. The bony thorax is intact. IMPRESSION: No acute cardiopulmonary findings. Chronic bronchitic type interstitial lung changes and scarring. Electronically Signed   By: Marijo Sanes M.D.   On: 12/20/2016 09:03    Procedures Procedures (including critical care time)  Medications Ordered in ED Medications  nitroGLYCERIN (NITROSTAT) SL tablet 0.4 mg (0.4 mg Sublingual Given 12/20/16 0955)  atenolol (TENORMIN) tablet 50 mg (50 mg Oral Given 12/20/16 1141)  aspirin chewable tablet 324 mg (324 mg Oral Given 12/20/16 0950)     Initial Impression / Assessment and Plan / ED Course  I have reviewed the triage vital signs and the nursing notes.  Pertinent labs & imaging results that were available during my care of the patient were reviewed by me and considered in my medical decision making (see chart for details).     83 yo M with a cc of chest pain.  Woke up with this pain.  Some typical components to it with prior MI will discuss with cards.   Cards to admit.   The patients results and plan were reviewed and discussed.   Any x-rays performed were independently reviewed by myself.   Differential diagnosis were considered with the presenting HPI.  Medications    nitroGLYCERIN (NITROSTAT) SL tablet 0.4 mg (0.4 mg Sublingual Given 12/20/16 0955)  atenolol (TENORMIN) tablet 50 mg (50 mg Oral Given 12/20/16 1141)  aspirin chewable tablet 324 mg (324 mg Oral Given 12/20/16 0950)    Vitals:   12/20/16 1300 12/20/16 1401 12/20/16 1430 12/20/16 1445  BP: (!) 172/93  (!) 177/101 (!) 161/86  Pulse: (!) 58  (!) 54 (!) 54  Resp: 20  16 14   Temp:  97.8 F (36.6 C)    SpO2: 99%  97% 100%    Final diagnoses:  Chest pain with high risk for cardiac etiology    Admission/ observation were discussed with the admitting physician, patient and/or family and they are comfortable with the plan.    Final Clinical Impressions(s) / ED Diagnoses   Final diagnoses:  Chest pain with high risk for cardiac etiology    New Prescriptions New Prescriptions   No medications on file     Deno Etienne, DO 12/20/16 1608

## 2016-12-20 NOTE — Plan of Care (Signed)
Problem: Safety: Goal: Ability to remain free from injury will improve Outcome: Progressing Pt using call light for assistance.

## 2016-12-20 NOTE — ED Notes (Signed)
Spoke with EDP patient able to eat and drink.  Gave patient [place] sandwich meal and diet ginger ale. Two family members at bedside.

## 2016-12-20 NOTE — ED Notes (Signed)
Pt brought to F07, A&Ox4, wife at bedside.  Heparin infusing.

## 2016-12-20 NOTE — ED Notes (Signed)
Spoke with Doctor regarding patient's blood pressure. Patient did not take his Atenolol today.

## 2016-12-20 NOTE — Progress Notes (Deleted)
Subjective:    Patient ID: Evan [name], male    DOB: [birth year], 83 y.o.   MRN: [id number]  C.C.:  Follow-up for Radiation Pneumonitis, Adenocarcinoma Right Lung, and Pulmonary Hypertension.   HPI Radiation pneumonitis: Treated with 3 week course of prednisone starting at 20 mg daily at last appointment in August.  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 Status post SBRT ending 02/22/16. Following with medical oncology.  Pulmonary hypertension: Likely multifactorial & suggested on echocardiogram. Overnight oximetry ordered at last appointment.  Review of Systems   Allergies  Allergen Reactions  . Feldene [Piroxicam] Other (See Comments)    REACTION: Blisters  . Statins Other (See Comments)    Myalgias  . Doxycycline Other (See Comments)    REACTION:  unknown  . Latex Rash  . Tequin Anxiety  . Valium Other (See Comments)    REACTION:  unknown    Current Outpatient Prescriptions on File Prior to Visit  Medication Sig Dispense Refill  . albuterol (VENTOLIN HFA) 108 (90 Base) MCG/ACT inhaler Inhale 1-2 puffs into the lungs every 6 (six) hours as needed for wheezing or shortness of breath. 18 g 2  . amoxicillin (AMOXIL) 500 MG capsule Take 2,000 mg by mouth See admin instructions. Take 4 capsules (2000 mg) by mouth one hour prior to dental appointments    . atenolol (TENORMIN) 50 MG tablet Take 1 tablet (50 mg total) by mouth daily. Please call and schedule follow up. [phone] 30 tablet 0  . Cholecalciferol 2000 units TABS Take 1 tablet (2,000 Units total) by mouth daily. 30 each 11  . clopidogrel (PLAVIX) 75 MG tablet Take 1 tablet (75 mg total) by mouth daily. 90 tablet 1  . Cyanocobalamin (VITAMIN B-12) 2500 MCG SUBL Place 2,500 mcg under the tongue daily.    Marland Kitchen FLUoxetine (PROZAC) 20 MG capsule TAKE ONE CAPSULE BY MOUTH DAILY 90 capsule 1  . leflunomide (ARAVA) 10 MG tablet Take 10 mg by mouth daily.     Marland Kitchen Lifitegrast (XIIDRA) 5 %  SOLN Place 1-2 drops into both eyes daily.    Marland Kitchen LORazepam (ATIVAN) 1 MG tablet TAKE ONE (1) TABLET BY MOUTH 3 TIMES DAILY 90 tablet 0  . nitroGLYCERIN (NITROSTAT) 0.4 MG SL tablet Place 1 tablet (0.4 mg total) under the tongue every 5 (five) minutes as needed for chest pain. 25 tablet 0  . pantoprazole (PROTONIX) 40 MG tablet Take 1 tablet (40 mg total) by mouth daily. 90 tablet 2  . rosuvastatin (CRESTOR) 5 MG tablet TAKE 1 TABLET EVERY DAY(NEED TO SCHEDULE YEARLY APPOINTMENT FOR AUGUST ) 30 tablet 0  . Spacer/Aero-Holding Chambers (AEROCHAMBER PLUS WITH MASK) inhaler Use as instructed 1 each 2  . tamsulosin (FLOMAX) 0.4 MG CAPS capsule TAKE 1 CAPSULE EVERY DAY 90 capsule 3   No current facility-administered medications on file prior to visit.     Past Medical History:  Diagnosis Date  . Adenocarcinoma of right lung ([place]) 01/06/2016  . Anginal chest pain at rest [facility])    Chronic non, controlled on Lorazepam  . Anxiety   . Arthritis    "all over"   . BPH (benign prostatic hyperplasia)   . CAD (coronary artery disease)   . Chronic lower back pain   . Complication of anesthesia    "problems making water afterwards"  . Depression   . DJD (degenerative joint disease)   . Esophageal ulcer without bleeding    bid ppi indefinitely  . Family  history of adverse reaction to anesthesia    "children w/PONV"  . GERD (gastroesophageal reflux disease)   . Headache    "couple/week maybe" (09/04/2015)  . Hemochromatosis    Possible, elevated iron stores, hook-like osteophytes on hand films, normal LFTs  . Hepatitis    "yellow jaundice as a baby"  . History of ASCVD    MULTIVESSEL  . Hyperlipidemia   . Hypertension   . Myocardial infarction ([place]) 1999  . Osteoarthritis   . Pneumonia    "several times; got a little now" (09/04/2015)  . Rheumatoid arthritis ([place])   . Subdural hematoma ([facility])    small after fall 09/2016-plavix held, neurosurgery consulted.  Asymptomatic.      Past Surgical  History:  Procedure Laterality Date  . ARM SKIN LESION BIOPSY / EXCISION Left 09/02/2015  . CATARACT EXTRACTION W/ INTRAOCULAR LENS  IMPLANT, BILATERAL Bilateral   . CHOLECYSTECTOMY OPEN    . CORNEAL TRANSPLANT Bilateral    "one at [facility]; one at [facility]"  . CORONARY ANGIOPLASTY WITH STENT PLACEMENT    . CORONARY ARTERY BYPASS GRAFT  1999  . DESCENDING AORTIC ANEURYSM REPAIR W/ STENT     DE stent ostium into the right radial free graft at OM-, 02-2007  . ESOPHAGOGASTRODUODENOSCOPY N/A 11/09/2014   Procedure: ESOPHAGOGASTRODUODENOSCOPY (EGD);  Surgeon: [name], MD;  Location: [facility] ENDOSCOPY;  Service: Endoscopy;  Laterality: N/A;  . INGUINAL HERNIA REPAIR    . JOINT REPLACEMENT    . NASAL SINUS SURGERY    . SHOULDER OPEN ROTATOR CUFF REPAIR Right   . TOTAL KNEE ARTHROPLASTY Bilateral     Family History  Problem Relation Age of Onset  . Arthritis-Osteo Sister   . Heart attack Brother   . Heart attack Other   . Cancer Neg Hx     Social History   Social History  . Marital status: Widowed    Spouse name: N/A  . Number of children: N/A  . Years of education: N/A   Social History Main Topics  . Smoking status: Never Smoker  . Smokeless tobacco: Former Systems developer    Types: Chew     Comment: "quit chewing in the 1960s"  . Alcohol use No  . Drug use: No  . Sexual activity: No   Other Topics Concern  . Not on file   Social History Narrative  . No narrative on file      Objective:   Physical Exam There were no vitals taken for this visit.  General:  Awake. Alert. No acute distress. Sitting watching TV. Family at bedside.  Integument:  Warm & dry. No rash on exposed skin. No bruising. Extremities:  No cyanosis or clubbing.  HEENT:  Moist mucus membranes. No oral ulcers. No scleral injection or icterus. Endotracheal tube in place. PERRL. Cardiovascular:  Regular rate. No edema. No appreciable JVD.  Pulmonary:  Good aeration & clear to auscultation bilaterally. Symmetric chest wall  expansion. No accessory muscle use. Abdomen: Soft. Normal bowel sounds. Nondistended. Grossly nontender. Musculoskeletal:  Normal bulk and tone. Hand grip strength 5/5 bilaterally. No joint deformity or effusion appreciated.  *** General:  Awake. Alert. No acute distress. Elderly male. Accompanied by daughter today. Integument:  Warm & dry. No rash on exposed skin.  Extremities:  No cyanosis or clubbing. Slight bluish hue the patient's lips. HEENT:  Moist mucus membranes. No oral ulcers. No nasal turbinate swelling. No scleral icterus. Cardiovascular:  Regular rate. No edema. No appreciable JVD.  Pulmonary:  Minimal crackles  right lung base. Otherwise good aeration bilaterally. Symmetric chest wall expansion. No accessory muscle use on room air. Abdomen: Soft. Normal bowel sounds. Nondistended. Nontender. Musculoskeletal:  Normal bulk and tone. No joint deformity or effusion appreciated. No chest wall deformity. Tenderness to palpation in left chest wall lateral to the midclavicular line in the lower rib cage.  PFT 11/09/16: FVC 2.38 L (70%) FEV1 2.03 L (87%) FEV1/FVC 0.85 FEF 25-75 2.64 L (183%) 01/05/16: FVC 3.26 L (111%) FEV1 2.61 L (131%) FEV1/FVC 0.80 FEF 25-75 2.60 L (214%) negative bronchodilator response TLC 5.47 L (90%) RV 86% ERV 401% DLCO corrected 46%  WALK TEST 10/11/16:  Walked 1 lap / Baseline Sat 100% on RA / Nadir Sat 100% on RA (stopped due to feeling his legs were weak)  IMAGING CT CHEST W/ CONTRAST 10/04/16 (previously reviewed by me): Trace left pleural effusion. Pleural thickening on the right noted. No pathologic mediastinal adenopathy. No pericardial effusion. Improvement in right basilar density compared with CT imaging performed in February. There is also decreased subpleural patchy groundglass opacities as well. No new or developing areas of opacification or groundglass. Questionable subpleural reticulation, primarily on the right. No obvious honeycombing or  bronchiectasis appreciated.  PET CT 12/22/15 (per radiologist):   IMPRESSION: 1. Hypermetabolic (max SUV 3.9) posterior right lower lobe predominantly solid 2.9 cm pulmonary nodule, which is increased in size back to 2013, most consistent with primary bronchogenic adenocarcinoma. 2. Patchy subpleural reticulation throughout both lungs, basilar predominant, suspicious for interstitial lung disease, not well characterized on this study. Consider correlation with high-resolution chest CT in 6-12 months to assess temporal pattern stability, as clinically warranted. 3. Hypermetabolic bilateral hilar and mediastinal lymphadenopathy is highly nonspecific given the suspected underlying interstitial lung disease, and could be reactive or metastatic. 4. Nonspecific hypermetabolic right level 2 neck lymph node, favor reactive node, cannot exclude metastasis. 5. No hypermetabolic osseous or abdominopelvic metastatic disease.  6. Mild hypermetabolism associated with subcutaneous fat stranding in the lateral right hip soft tissues, probably benign, correlate with any history of recent trauma or procedure in this location. 7. Relatively symmetric hypermetabolism in the bilateral glenohumeral joints with associated osteoarthritis, favor degenerative uptake. 8. Additional findings include mild cardiomegaly, aortic atherosclerosis, coronary atherosclerosis status post CABG and moderate sigmoid diverticulosis.  CARDIAC TTE (05/23/16): The normal in size with moderate concentric hypertrophy. EF 60-65% with normal diastolic function. LA moderately to severely dilated with RA normal in size. RV normal in size and function. Mild aortic stenosis with moderately calcified leaflets. Mild mitral regurgitation. Trivial pulmonic regurgitation. Mild tricuspid regurgitation.  PATHOLOGY CT-GUIDED BIOPSY RIGHT LOWER LOBE MASS (12/30/15): Adenocarcinoma  LABS CBC Latest Ref Rng & Units 10/03/2016 09/10/2016 08/23/2016  WBC 3.8 - 10.8  K/uL 6.3 7.3 6.7  Hemoglobin 13.0 - 17.0 g/dL 13.2 12.7(L) 12.9(L)  Hematocrit 38.5 - 50.0 % 39.8 38.6(L) 38.5  Platelets 140 - 400 K/uL 98(L) 90(L) SEE NOTE   BMP Latest Ref Rng & Units 11/29/2016 10/03/2016 09/10/2016  Glucose 70 - 99 mg/dL 160(H) 122(H) 139(H)  BUN 7 - 25 mg/dL _0 Creatinine 0.70 - 1.11 mg/dL 1.08 1.06 0.98  Sodium 135 - 146 mmol/L 138 136 133(L)  Potassium 3.5 - 5.3 mmol/L 4.0 3.9 4.1  Chloride 98 - 110 mmol/L 106 105 106  CO2 20 - 32 mmol/L 17(L) 20 18(L)  Calcium 8.6 - 10.3 mg/dL 8.6 9.0 8.9   Hepatic Function Latest Ref Rng & Units 11/29/2016 10/03/2016 09/10/2016  Total Protein 6.1 - 8.1  g/dL 6.1 6.4 6.1(L)  Albumin 3.6 - 5.1 g/dL 3.9 3.8 3.4(L)  AST 10 - 35 U/L _0 ALT 9 - 46 U/L 15 8(L) 11(L)  Alk Phosphatase 40 - 115 U/L 86 112 93  Total Bilirubin 0.2 - 1.2 mg/dL 1.2 1.7(H) 1.2  Bilirubin, Direct 0.1 - 0.5 mg/dL - - 0.2      Assessment & Plan:  83 y.o.   male with right lower lobe adenocarcinoma. Status post radiation therapy. Patient does appear to have had some radiation pneumonitis on the right which is progressively improving on repeat imaging. Patient does have some reproducible chest wall pain on the left but without obvious source of trauma. It's possible that this was exacerbated by [place]. I am holding off on initiating prednisone therapy at this time. I instructed the patient to notify my office if his symptoms did not improve or worsened before his next appointment.  1. Radiation pneumonitis: 2. Adenocarcinoma right lung: 3. Pulmonary hypertension: 4. Health maintenance:  Status post Tdap December 2011, Pneumovax October 2001 & Prevnar March 2016. 5. Follow-up: Return to clinic in Right lower lobe opacity:  Suspect this is resolving radiation pneumonitis/treatment of right lung cancer. Low probability for infectious process. Suspect resolving radiation pneumonitis given improvement with serial  exams. 6. Adenocarcinoma right lung: Status post treatment with radiation therapy. Continuing to follow-up with medical oncology. 7. Pleurisy/Chest Wall Pain: Appears to be musculoskeletal in etiology given reproducibility. Checking ESR and CRP today. Checking chest x-ray PA/LAT and rib films. Recommended pain medication & moist heat for pain relief. 8. Health maintenance:  9. Follow-up: Return to clinic in 4 weeks or sooner if needed.   Sonia Baller Ashok Cordia, M.D. [facility] Pager:  [phone] After 3pm or if no response, call [phone] 3:15 AM 12/20/16

## 2016-12-20 NOTE — Progress Notes (Signed)
ANTICOAGULATION CONSULT NOTE - Initial Consult  Pharmacy Consult for heparin Indication: chest pain/ACS  Allergies  Allergen Reactions  . Feldene [Piroxicam] Other (See Comments)    REACTION: Blisters  . Statins Other (See Comments)    Myalgias  . Doxycycline Other (See Comments)    REACTION:  unknown  . Latex Rash  . Tequin Anxiety  . Valium Other (See Comments)    REACTION:  unknown    Patient Measurements: Height: 5\' 8"  (172.7 cm) Weight: 173 lb 15.1 oz (78.9 kg) IBW/kg (Calculated) : 68.4 Heparin Dosing Weight: 78.9kg  Vital Signs: Temp: 97.8 F (36.6 C) (09/18 1401) BP: 176/85 (09/18 1715) Pulse Rate: 54 (09/18 1715)  Labs:  Recent Labs  12/20/16 0827  HGB 13.7  HCT 39.9  PLT 111*  CREATININE 1.15    Estimated Creatinine Clearance: 43.8 mL/min (by C-G formula based on SCr of 1.15 mg/dL).   Medical History: Past Medical History:  Diagnosis Date  . Adenocarcinoma of right lung ([place]) 01/06/2016  . Anginal chest pain at rest [facility])    Chronic non, controlled on Lorazepam  . Anxiety   . Arthritis    "all over"   . BPH (benign prostatic hyperplasia)   . CAD (coronary artery disease)   . Chronic lower back pain   . Complication of anesthesia    "problems making water afterwards"  . Depression   . DJD (degenerative joint disease)   . Esophageal ulcer without bleeding    bid ppi indefinitely  . Family history of adverse reaction to anesthesia    "children w/PONV"  . GERD (gastroesophageal reflux disease)   . Headache    "couple/week maybe" (09/04/2015)  . Hemochromatosis    Possible, elevated iron stores, hook-like osteophytes on hand films, normal LFTs  . Hepatitis    "yellow jaundice as a baby"  . History of ASCVD    MULTIVESSEL  . Hyperlipidemia   . Hypertension   . Myocardial infarction ([place]) 1999  . Osteoarthritis   . Pneumonia    "several times; got a little now" (09/04/2015)  . Rheumatoid arthritis ([place])   . Subdural hematoma ([facility])    small after fall 09/2016-plavix held, neurosurgery consulted.  Asymptomatic.      Medications:  Infusions:  . heparin      Assessment: 60 yom presented to the ED with CP. To start IV heparin. Baseline H/H is WNL but platelets are low at 111. Will monitor closely. He is not on anticoagulation PTA.   Goal of Therapy:  Heparin level 0.3-0.7 units/ml Monitor platelets by anticoagulation protocol: Yes   Plan:  Heparin bolus 4000 units IV x 1 Heparin gtt 1000 units/hr Check an 8 hr heparin level Daily heparin level and CBC  Evan [name], [name] 12/20/2016,5:54 PM

## 2016-12-21 ENCOUNTER — Inpatient Hospital Stay (HOSPITAL_COMMUNITY): Payer: Medicare HMO

## 2016-12-21 DIAGNOSIS — C3491 Malignant neoplasm of unspecified part of right bronchus or lung: Secondary | ICD-10-CM

## 2016-12-21 DIAGNOSIS — R079 Chest pain, unspecified: Secondary | ICD-10-CM

## 2016-12-21 DIAGNOSIS — I309 Acute pericarditis, unspecified: Principal | ICD-10-CM

## 2016-12-21 DIAGNOSIS — I361 Nonrheumatic tricuspid (valve) insufficiency: Secondary | ICD-10-CM

## 2016-12-21 DIAGNOSIS — I340 Nonrheumatic mitral (valve) insufficiency: Secondary | ICD-10-CM

## 2016-12-21 LAB — ECHOCARDIOGRAM COMPLETE
AO mean calculated velocity dopler: 111 cm/s
AV Area VTI index: 0.73 cm2/m2
AV Area VTI: 1.08 cm2
AV Area mean vel: 1.43 cm2
AV Mean grad: 6 mmHg
AV Peak grad: 18 mmHg
AV VEL mean LVOT/AV: 0.41
AV area mean vel ind: 0.75 cm2/m2
AV peak Index: 0.57
AV pk vel: 211 cm/s
AV vel: 1.38
Ao pk vel: 0.31 m/s
E decel time: 137 msec
E/e' ratio: 10.03
FS: 18 % — AB (ref 28–44)
Height: 68 in
IVS/LV PW RATIO, ED: 1.05
LA ID, A-P, ES: 45 mm
LA diam end sys: 45 mm
LA diam index: 2.37 cm/m2
LA vol A4C: 142 ml
LA vol index: 71.6 mL/m2
LA vol: 136 mL
LV E/e' medial: 10.03
LV E/e'average: 10.03
LV PW d: 12.5 mm — AB (ref 0.6–1.1)
LV e' LATERAL: 7.56 cm/s
LVOT SV: 52 mL
LVOT VTI: 15.1 cm
LVOT area: 3.46 cm2
LVOT diameter: 21 mm
LVOT peak VTI: 0.4 cm
LVOT peak vel: 66 cm/s
MV Dec: 137
MV Peak grad: 2 mmHg
MV pk A vel: 37.7 m/s
MV pk E vel: 75.8 m/s
Reg peak vel: 271 cm/s
TAPSE: 8.54 mm
TDI e' lateral: 7.56
TDI e' medial: 5.22
TR max vel: 271 cm/s
VTI: 37.9 cm
Valve area index: 0.73
Valve area: 1.38 cm2
Weight: 2699.2 oz

## 2016-12-21 LAB — LIPID PANEL
Cholesterol: 113 mg/dL (ref 0–200)
HDL: 28 mg/dL — ABNORMAL LOW (ref >40)
LDL Cholesterol: 65 mg/dL (ref 0–99)
Total CHOL/HDL Ratio: 4 RATIO
Triglycerides: 98 mg/dL (ref <150)
VLDL: 20 mg/dL (ref 0–40)

## 2016-12-21 LAB — HEPARIN LEVEL (UNFRACTIONATED)
Heparin Unfractionated: 0.31 IU/mL (ref 0.30–0.70)
Heparin Unfractionated: 0.33 IU/mL (ref 0.30–0.70)

## 2016-12-21 LAB — BASIC METABOLIC PANEL
Anion gap: 8 (ref 5–15)
BUN: 19 mg/dL (ref 6–20)
CO2: 20 mmol/L — ABNORMAL LOW (ref 22–32)
Calcium: 8.8 mg/dL — ABNORMAL LOW (ref 8.9–10.3)
Chloride: 109 mmol/L (ref 101–111)
Creatinine, Ser: 1.18 mg/dL (ref 0.61–1.24)
GFR calc Af Amer: >60 mL/min (ref >60)
GFR calc non Af Amer: 54 mL/min — ABNORMAL LOW (ref >60)
Glucose, Bld: 98 mg/dL (ref 65–99)
Potassium: 3.8 mmol/L (ref 3.5–5.1)
Sodium: 137 mmol/L (ref 135–145)

## 2016-12-21 LAB — CBC
HCT: 37 % — ABNORMAL LOW (ref 39.0–52.0)
HCT: 39.2 % (ref 39.0–52.0)
Hemoglobin: 12.2 g/dL — ABNORMAL LOW (ref 13.0–17.0)
Hemoglobin: 13.2 g/dL (ref 13.0–17.0)
MCH: 30 pg (ref 26.0–34.0)
MCH: 31.4 pg (ref 26.0–34.0)
MCHC: 33 g/dL (ref 30.0–36.0)
MCHC: 33.7 g/dL (ref 30.0–36.0)
MCV: 90.9 fL (ref 78.0–100.0)
MCV: 93.3 fL (ref 78.0–100.0)
Platelets: 95 10*3/uL — ABNORMAL LOW (ref 150–400)
Platelets: 95 10*3/uL — ABNORMAL LOW (ref 150–400)
RBC: 4.07 MIL/uL — ABNORMAL LOW (ref 4.22–5.81)
RBC: 4.2 MIL/uL — ABNORMAL LOW (ref 4.22–5.81)
RDW: 13.8 % (ref 11.5–15.5)
RDW: 14.3 % (ref 11.5–15.5)
WBC: 4.6 10*3/uL (ref 4.0–10.5)
WBC: 4.9 10*3/uL (ref 4.0–10.5)

## 2016-12-21 LAB — CREATININE, SERUM
Creatinine, Ser: 1.18 mg/dL (ref 0.61–1.24)
GFR calc Af Amer: >60 mL/min (ref >60)
GFR calc non Af Amer: 54 mL/min — ABNORMAL LOW (ref >60)

## 2016-12-21 LAB — TROPONIN I: Troponin I: <0.03 ng/mL (ref <0.03)

## 2016-12-21 MED ORDER — COLCHICINE 0.6 MG PO TABS
0.6000 mg | ORAL_TABLET | Freq: Two times a day (BID) | ORAL | Status: DC
  Administered 2016-12-21 – 2016-12-23 (×5): 0.6 mg via ORAL
  Filled 2016-12-21 (×5): qty 1

## 2016-12-21 MED ORDER — MORPHINE SULFATE (PF) 2 MG/ML IV SOLN
2.0000 mg | INTRAVENOUS | Status: DC | PRN
  Administered 2016-12-21 (×4): 2 mg via INTRAVENOUS
  Filled 2016-12-21 (×4): qty 1

## 2016-12-21 MED ORDER — ENOXAPARIN SODIUM 40 MG/0.4ML ~~LOC~~ SOLN
40.0000 mg | SUBCUTANEOUS | Status: DC
  Administered 2016-12-21 – 2016-12-22 (×2): 40 mg via SUBCUTANEOUS
  Filled 2016-12-21 (×2): qty 0.4

## 2016-12-21 MED ORDER — INFLUENZA VAC SPLIT HIGH-DOSE 0.5 ML IM SUSY
0.5000 mL | PREFILLED_SYRINGE | INTRAMUSCULAR | Status: AC
  Administered 2016-12-23: 0.5 mL via INTRAMUSCULAR
  Filled 2016-12-21: qty 0.5

## 2016-12-21 MED ORDER — ASPIRIN 325 MG PO TABS
650.0000 mg | ORAL_TABLET | Freq: Three times a day (TID) | ORAL | Status: DC
  Administered 2016-12-21 – 2016-12-23 (×6): 650 mg via ORAL
  Filled 2016-12-21 (×6): qty 2

## 2016-12-21 MED ORDER — ATENOLOL 25 MG PO TABS
25.0000 mg | ORAL_TABLET | Freq: Every day | ORAL | Status: DC
  Administered 2016-12-22 – 2016-12-23 (×2): 25 mg via ORAL
  Filled 2016-12-21 (×2): qty 1

## 2016-12-21 NOTE — Progress Notes (Signed)
  Echocardiogram 2D Echocardiogram has been performed.  Jennette Dubin 12/21/2016, 12:48 PM

## 2016-12-21 NOTE — Progress Notes (Signed)
ANTICOAGULATION CONSULT NOTE - F/u Consult  Pharmacy Consult for heparin Indication: chest pain/ACS  Allergies  Allergen Reactions  . Feldene [Piroxicam] Other (See Comments)    REACTION: Blisters  . Statins Other (See Comments)    Myalgias  . Doxycycline Other (See Comments)    REACTION:  unknown  . Latex Rash  . Tequin Anxiety  . Valium Other (See Comments)    REACTION:  unknown    Patient Measurements: Height: 5\' 8"  (172.7 cm) Weight: 173 lb 15.1 oz (78.9 kg) IBW/kg (Calculated) : 68.4 Heparin Dosing Weight: 78.9kg  Vital Signs: Temp: 97.8 F (36.6 C) (09/18 2030) Temp Source: Oral (09/18 2030) BP: 115/80 (09/19 0143) Pulse Rate: 66 (09/19 0143)  Labs:  Recent Labs  12/20/16 0827 12/20/16 1651 12/20/16 2114 12/21/16 0211  HGB 13.7  --   --  12.2*  HCT 39.9  --   --  37.0*  PLT 111*  --   --  95*  HEPARINUNFRC  --   --   --  0.31  CREATININE 1.15  --   --   --   TROPONINI  --  <0.03 <0.03  --     Estimated Creatinine Clearance: 43.8 mL/min (by C-G formula based on SCr of 1.15 mg/dL).   Medical History: Past Medical History:  Diagnosis Date  . Adenocarcinoma of right lung ([place]) 01/06/2016  . Anginal chest pain at rest [facility])    Chronic non, controlled on Lorazepam  . Anxiety   . Arthritis    "all over"   . BPH (benign prostatic hyperplasia)   . CAD (coronary artery disease)   . Chronic lower back pain   . Complication of anesthesia    "problems making water afterwards"  . Depression   . DJD (degenerative joint disease)   . Esophageal ulcer without bleeding    bid ppi indefinitely  . Family history of adverse reaction to anesthesia    "children w/PONV"  . GERD (gastroesophageal reflux disease)   . Headache    "couple/week maybe" (09/04/2015)  . Hemochromatosis    Possible, elevated iron stores, hook-like osteophytes on hand films, normal LFTs  . Hepatitis    "yellow jaundice as a baby"  . History of ASCVD    MULTIVESSEL  . Hyperlipidemia    . Hypertension   . Myocardial infarction ([place]) 1999  . Osteoarthritis   . Pneumonia    "several times; got a little now" (09/04/2015)  . Rheumatoid arthritis ([place])   . Subdural hematoma ([facility])    small after fall 09/2016-plavix held, neurosurgery consulted.  Asymptomatic.      Medications:  Infusions:  . sodium chloride 10 mL/hr at 12/20/16 2128  . heparin 1,000 Units/hr (12/20/16 1818)    Assessment: 48 yom presented to the ED with CP. To start IV heparin.   Heparin level therapeutic at 0.31. Hgb and plts with slight downtrend, no s/s bleeding noted.   Goal of Therapy:  Heparin level 0.3-0.7 units/ml Monitor platelets by anticoagulation protocol: Yes   Plan:  Continue heparin gtt at 1000 units/hr Confirm heparin level in 8 hrs Daily heparin level and CBC Monitor for s/s bleeding   Argie Ramming, PharmD Clinical Pharmacist 12/21/16 3:43 AM

## 2016-12-21 NOTE — Progress Notes (Signed)
DAILY PROGRESS NOTE   Patient Name: Evan [name] Date of Encounter: 12/21/2016  Hospital Problem List   Principal Problem:   Chest pain at rest Active Problems:   Hyperlipidemia   Hypertension   S/P CABG (coronary artery bypass graft)   Adenocarcinoma of right lung [facility])   Atrial fibrillation ([place])   Unstable angina [facility])    Chief Complaint   More short of breath, chest pain  Subjective   [name] reports more shortness of breath and chest pain today. Ruled out for MI. Echo performed today shows stable LVEF and mild AS. There is a new, small pericardial effusion. He does not report his symptoms are worse with lying down or sitting up. Pain relieved with morphine, but comes back after about 4 hours.  History of lung adenocarcinoma - ?if this could be a malignant effusion.  Objective   Vitals:   12/21/16 0500 12/21/16 0833 12/21/16 1253 12/21/16 1321  BP: (!) 177/89 (!) 152/84 (!) 165/66 (!) 148/92  Pulse: 72 74 73 63  Resp: (!) 22   (!) 65  Temp: 98.8 F (37.1 C)   97.9 F (36.6 C)  TempSrc: Oral   Oral  SpO2: 97%   97%  Weight: 168 lb 11.2 oz (76.5 kg)     Height:        Intake/Output Summary (Last 24 hours) at 12/21/16 1607 Last data filed at 12/21/16 1410  Gross per 24 hour  Intake           364.33 ml  Output              575 ml  Net          -210.67 ml   Filed Weights   12/20/16 1715 12/21/16 0500  Weight: 173 lb 15.1 oz (78.9 kg) 168 lb 11.2 oz (76.5 kg)    Physical Exam   General appearance: alert and no distress Neck: no carotid bruit, no JVD and thyroid not enlarged, symmetric, no tenderness/mass/nodules Lungs: diminished breath sounds bilaterally and rales bibasilar and RUL Heart: irregularly irregular rhythm Abdomen: soft, non-tender; bowel sounds normal; no masses,  no organomegaly Extremities: extremities normal, atraumatic, no cyanosis or edema Pulses: 2+ and symmetric Skin: Skin color, texture, turgor normal. No rashes or  lesions Neurologic: Grossly normal Psych: Pleasant  Inpatient Medications    Scheduled Meds: . aspirin EC  81 mg Oral Daily  . atenolol  50 mg Oral Daily  . cholecalciferol  2,000 Units Oral Daily  . vitamin B-12  500 mcg Oral Daily  . FLUoxetine  20 mg Oral Daily  . [START ON 12/22/2016] Influenza vac split quadrivalent PF  0.5 mL Intramuscular Tomorrow-1000  . leflunomide  10 mg Oral Daily  . LORazepam  0.5 mg Oral Q lunch  . LORazepam  1 mg Oral BID  . nitroGLYCERIN  1 inch Topical Q6H  . pantoprazole  40 mg Oral Daily  . rosuvastatin  5 mg Oral Daily  . tamsulosin  0.4 mg Oral Daily    Continuous Infusions: . sodium chloride 10 mL/hr at 12/20/16 2128  . heparin 1,000 Units/hr (12/20/16 1818)    PRN Meds: acetaminophen, morphine injection, ondansetron (ZOFRAN) IV, polyvinyl alcohol   Labs   Results for orders placed or performed during the hospital encounter of 12/20/16 (from the past 48 hour(s))  Basic metabolic panel     Status: Abnormal   Collection Time: 12/20/16  8:27 AM  Result Value Ref Range   Sodium 139  135 - 145 mmol/L   Potassium 4.2 3.5 - 5.1 mmol/L   Chloride 108 101 - 111 mmol/L   CO2 24 22 - 32 mmol/L   Glucose, Bld 115 (H) 65 - 99 mg/dL   BUN 15 6 - 20 mg/dL   Creatinine, Ser 1.15 0.61 - 1.24 mg/dL   Calcium 9.4 8.9 - 10.3 mg/dL   GFR calc non Af Amer 55 (L) >60 mL/min   GFR calc Af Amer >60 >60 mL/min    Comment: (NOTE) The eGFR has been calculated using the CKD EPI equation. This calculation has not been validated in all clinical situations. eGFR's persistently <60 mL/min signify possible Chronic Kidney Disease.    Anion gap 7 5 - 15  CBC     Status: Abnormal   Collection Time: 12/20/16  8:27 AM  Result Value Ref Range   WBC 4.5 4.0 - 10.5 K/uL   RBC 4.35 4.22 - 5.81 MIL/uL   Hemoglobin 13.7 13.0 - 17.0 g/dL   HCT 39.9 39.0 - 52.0 %   MCV 91.7 78.0 - 100.0 fL   MCH 31.5 26.0 - 34.0 pg   MCHC 34.3 30.0 - 36.0 g/dL   RDW 14.0 11.5 -  15.5 %   Platelets 111 (L) 150 - 400 K/uL    Comment: SPECIMEN CHECKED FOR CLOTS REPEATED TO VERIFY PLATELET COUNT CONFIRMED BY SMEAR   I-stat troponin, ED     Status: None   Collection Time: 12/20/16  8:44 AM  Result Value Ref Range   Troponin i, poc 0.02 0.00 - 0.08 ng/mL   Comment 3            Comment: Due to the release kinetics of cTnI, a negative result within the first hours of the onset of symptoms does not rule out myocardial infarction with certainty. If myocardial infarction is still suspected, repeat the test at appropriate intervals.   TSH     Status: None   Collection Time: 12/20/16  3:13 PM  Result Value Ref Range   TSH 1.003 0.350 - 4.500 uIU/mL    Comment: Performed by a 3rd Generation assay with a functional sensitivity of <=0.01 uIU/mL.  Troponin I     Status: None   Collection Time: 12/20/16  4:51 PM  Result Value Ref Range   Troponin I <0.03 <0.03 ng/mL  Magnesium     Status: None   Collection Time: 12/20/16  4:51 PM  Result Value Ref Range   Magnesium 1.9 1.7 - 2.4 mg/dL  T4, free     Status: None   Collection Time: 12/20/16  4:51 PM  Result Value Ref Range   Free T4 0.76 0.61 - 1.12 ng/dL    Comment: (NOTE) Biotin ingestion may interfere with free T4 tests. If the results are inconsistent with the TSH level, previous test results, or the clinical presentation, then consider biotin interference. If needed, order repeat testing after stopping biotin.   Troponin I     Status: None   Collection Time: 12/20/16  9:14 PM  Result Value Ref Range   Troponin I <0.03 <0.03 ng/mL  Troponin I     Status: None   Collection Time: 12/21/16  2:11 AM  Result Value Ref Range   Troponin I <0.03 <0.03 ng/mL  Heparin level (unfractionated)     Status: None   Collection Time: 12/21/16  2:11 AM  Result Value Ref Range   Heparin Unfractionated 0.31 0.30 - 0.70 IU/mL    Comment:  IF HEPARIN RESULTS ARE BELOW EXPECTED VALUES, AND PATIENT DOSAGE HAS BEEN  CONFIRMED, SUGGEST FOLLOW UP TESTING OF ANTITHROMBIN III LEVELS.   CBC     Status: Abnormal   Collection Time: 12/21/16  2:11 AM  Result Value Ref Range   WBC 4.6 4.0 - 10.5 K/uL   RBC 4.07 (L) 4.22 - 5.81 MIL/uL   Hemoglobin 12.2 (L) 13.0 - 17.0 g/dL   HCT 37.0 (L) 39.0 - 52.0 %   MCV 90.9 78.0 - 100.0 fL   MCH 30.0 26.0 - 34.0 pg   MCHC 33.0 30.0 - 36.0 g/dL   RDW 13.8 11.5 - 15.5 %   Platelets 95 (L) 150 - 400 K/uL    Comment: CONSISTENT WITH PREVIOUS RESULT  Basic metabolic panel     Status: Abnormal   Collection Time: 12/21/16  2:11 AM  Result Value Ref Range   Sodium 137 135 - 145 mmol/L   Potassium 3.8 3.5 - 5.1 mmol/L   Chloride 109 101 - 111 mmol/L   CO2 20 (L) 22 - 32 mmol/L   Glucose, Bld 98 65 - 99 mg/dL   BUN 19 6 - 20 mg/dL   Creatinine, Ser 1.18 0.61 - 1.24 mg/dL   Calcium 8.8 (L) 8.9 - 10.3 mg/dL   GFR calc non Af Amer 54 (L) >60 mL/min   GFR calc Af Amer >60 >60 mL/min    Comment: (NOTE) The eGFR has been calculated using the CKD EPI equation. This calculation has not been validated in all clinical situations. eGFR's persistently <60 mL/min signify possible Chronic Kidney Disease.    Anion gap 8 5 - 15  Lipid panel     Status: Abnormal   Collection Time: 12/21/16  2:11 AM  Result Value Ref Range   Cholesterol 113 0 - 200 mg/dL   Triglycerides 98 <150 mg/dL   HDL 28 (L) >40 mg/dL   Total CHOL/HDL Ratio 4.0 RATIO   VLDL 20 0 - 40 mg/dL   LDL Cholesterol 65 0 - 99 mg/dL    Comment:        Total Cholesterol/HDL:CHD Risk Coronary Heart Disease Risk Table                     Men   Women  1/2 Average Risk   3.4   3.3  Average Risk       5.0   4.4  2 X Average Risk   9.6   7.1  3 X Average Risk  23.4   11.0        Use the calculated Patient Ratio above and the CHD Risk Table to determine the patient's CHD Risk.        ATP III CLASSIFICATION (LDL):  <100     mg/dL   Optimal  100-129  mg/dL   Near or Above                    Optimal  130-159   mg/dL   Borderline  160-189  mg/dL   High  >190     mg/dL   Very High   Heparin level (unfractionated)     Status: None   Collection Time: 12/21/16 11:16 AM  Result Value Ref Range   Heparin Unfractionated 0.33 0.30 - 0.70 IU/mL    Comment:        IF HEPARIN RESULTS ARE BELOW EXPECTED VALUES, AND PATIENT DOSAGE HAS BEEN CONFIRMED, SUGGEST FOLLOW UP TESTING OF ANTITHROMBIN III LEVELS.  ECG   A-fib at 64 - Personally Reviewed  Telemetry   A-fib with CVR - Personally Reviewed  Radiology    Dg Chest 2 View  Result Date: 12/20/2016 CLINICAL DATA:  Chest pain and shortness of breath. EXAM: CHEST  2 VIEW COMPARISON:  10/11/2016 FINDINGS: The heart is normal in size and stable. Stable tortuosity and calcification of the thoracic aorta. Stable surgical changes from bypass surgery. Chronic bronchitic type interstitial lung changes and pulmonary scarring. No infiltrates, edema or effusions. The bony thorax is intact. IMPRESSION: No acute cardiopulmonary findings. Chronic bronchitic type interstitial lung changes and scarring. Electronically Signed   By: Marijo Sanes M.D.   On: 12/20/2016 09:03    Cardiac Studies   LV EF: 60% -   65%  ------------------------------------------------------------------- Indications:      Atrial fibrillation - 427.31.  ------------------------------------------------------------------- History:   PMH:   Coronary artery disease.  PMH:   Myocardial infarction.  Risk factors:  Hypertension. Dyslipidemia.  ------------------------------------------------------------------- Study Conclusions  - Left ventricle: The cavity size was normal. There was moderate   concentric hypertrophy. Systolic function was normal. The   estimated ejection fraction was in the range of 60% to 65%. Wall   motion was normal; there were no regional wall motion   abnormalities. - Aortic valve: Severely calcified annulus. Trileaflet; mildly   thickened, mildly calcified  leaflets. There was mild stenosis.   Valve area (VTI): 1.38 cm^2. Valve area (Vmax): 1.08 cm^2. Valve   area (Vmean): 1.43 cm^2. - Aorta: Aortic root dimension: 40 mm (ED). - Aortic root: The aortic root was mildly dilated. - Mitral valve: Calcified annulus. There was mild regurgitation. - Left atrium: The atrium was severely dilated. - Right atrium: The atrium was moderately dilated. - Tricuspid valve: There was mild regurgitation. - Pulmonary arteries: PA peak pressure: 32 mm Hg (S). - Pericardium, extracardiac: A trivial, free-flowing pericardial   effusion was identified posterior to the heart. The fluid had no   internal echoes.  Assessment   1. Principal Problem: 2.   Chest pain at rest 3. Active Problems: 4.   Hyperlipidemia 5.   Hypertension 6.   S/P CABG (coronary artery bypass graft) 7.   Adenocarcinoma of right lung ([place]) 8.   Atrial fibrillation ([place]) 9.   Unstable angina ([place]) 10.   Plan   1. Ruled out for MI. D/c heparin. Chest pain worse today - there is a new pericardial effusion, which is small. ?pericarditis. Could this be related to prior lung cancer. More dypsneic as well. Will repeat chest CT scan. Start NSAIDs and colchicine. Use morphine only for breakthrough pain. May need pulmonary consultation based on CT scan findings.  Time Spent Directly with Patient:  I have spent a total of 25 minutes with the patient reviewing hospital notes, telemetry, EKGs, labs and examining the patient as well as establishing an assessment and plan that was discussed personally with the patient. > 50% of time was spent in direct patient care.  Length of Stay:  LOS: 1 day   Pixie Casino, MD, [place]  Attending Cardiologist  Direct Dial: [phone]  Fax: [phone]  Website:  [email or web address] Aime 12/21/2016, 4:07 PM

## 2016-12-21 NOTE — Progress Notes (Signed)
Text paged [facility] PA-C about pt HR in the 40's to low 60 and sometimes 35's. Request to address pt low HR.

## 2016-12-21 NOTE — Progress Notes (Signed)
[facility] for heparin Indication: chest pain/ACS  Allergies  Allergen Reactions  . Feldene [Piroxicam] Other (See Comments)    REACTION: Blisters  . Statins Other (See Comments)    Myalgias  . Doxycycline Other (See Comments)    REACTION:  unknown  . Latex Rash  . Tequin Anxiety  . Valium Other (See Comments)    REACTION:  unknown    Patient Measurements: Height: 5\' 8"  (172.7 cm) Weight: 168 lb 11.2 oz (76.5 kg) IBW/kg (Calculated) : 68.4 Heparin Dosing Weight: 78.9kg  Vital Signs: Temp: 97.9 F (36.6 C) (09/19 1321) Temp Source: Oral (09/19 1321) BP: 148/92 (09/19 1321) Pulse Rate: 63 (09/19 1321)  Labs:  Recent Labs  12/20/16 0827 12/20/16 1651 12/20/16 2114 12/21/16 0211 12/21/16 1116  HGB 13.7  --   --  12.2*  --   HCT 39.9  --   --  37.0*  --   PLT 111*  --   --  95*  --   HEPARINUNFRC  --   --   --  0.31 0.33  CREATININE 1.15  --   --  1.18  --   TROPONINI  --  <0.03 <0.03 <0.03  --     Estimated Creatinine Clearance: 42.7 mL/min (by C-G formula based on SCr of 1.18 mg/dL).   Medical History: Past Medical History:  Diagnosis Date  . Adenocarcinoma of right lung ([place]) 01/06/2016  . Anginal chest pain at rest [facility])    Chronic non, controlled on Lorazepam  . Anxiety   . Arthritis    "all over"   . BPH (benign prostatic hyperplasia)   . CAD (coronary artery disease)   . Chronic lower back pain   . Complication of anesthesia    "problems making water afterwards"  . Depression   . DJD (degenerative joint disease)   . Esophageal ulcer without bleeding    bid ppi indefinitely  . Family history of adverse reaction to anesthesia    "children w/PONV"  . GERD (gastroesophageal reflux disease)   . Headache    "couple/week maybe" (09/04/2015)  . Hemochromatosis    Possible, elevated iron stores, hook-like osteophytes on hand films, normal LFTs  . Hepatitis    "yellow jaundice as a baby"  . History of ASCVD    MULTIVESSEL  . Hyperlipidemia   . Hypertension   . Myocardial infarction ([place]) 1999  . Osteoarthritis   . Pneumonia    "several times; got a little now" (09/04/2015)  . Rheumatoid arthritis ([place])   . Subdural hematoma ([facility])    small after fall 09/2016-plavix held, neurosurgery consulted.  Asymptomatic.      Medications:  Infusions:  . sodium chloride 10 mL/hr at 12/20/16 2128  . heparin 1,000 Units/hr (12/20/16 1818)    Assessment: 83 year old male on heparin gtt for rule out ACS. He has had a prior CABG in 1998 with re-stenting in 2008. Pt has had bouts of AFib in the past but is not on long-term anticoagulation. Chads2vasc score = 4. Heparin level is therapeutic. hgb 12.1, plts down to 95, but close to baseline.    Goal of Therapy:  Heparin level 0.3-0.7 units/ml Monitor platelets by anticoagulation protocol: Yes    Plan:  -Continue heparin at 1000 units/hr -Daily HL, CBC -F/u plans for long-term anticoagulation   Hughes Better, PharmD, BCPS Clinical Pharmacist 12/21/2016 1:45 PM

## 2016-12-22 LAB — BASIC METABOLIC PANEL
Anion gap: 7 (ref 5–15)
BUN: 19 mg/dL (ref 6–20)
CO2: 23 mmol/L (ref 22–32)
Calcium: 8.4 mg/dL — ABNORMAL LOW (ref 8.9–10.3)
Chloride: 107 mmol/L (ref 101–111)
Creatinine, Ser: 1.16 mg/dL (ref 0.61–1.24)
GFR calc Af Amer: >60 mL/min (ref >60)
GFR calc non Af Amer: 55 mL/min — ABNORMAL LOW (ref >60)
Glucose, Bld: 92 mg/dL (ref 65–99)
Potassium: 3.9 mmol/L (ref 3.5–5.1)
Sodium: 137 mmol/L (ref 135–145)

## 2016-12-22 LAB — CBC
HCT: 37.2 % — ABNORMAL LOW (ref 39.0–52.0)
Hemoglobin: 12.1 g/dL — ABNORMAL LOW (ref 13.0–17.0)
MCH: 30.3 pg (ref 26.0–34.0)
MCHC: 32.5 g/dL (ref 30.0–36.0)
MCV: 93 fL (ref 78.0–100.0)
Platelets: 88 10*3/uL — ABNORMAL LOW (ref 150–400)
RBC: 4 MIL/uL — ABNORMAL LOW (ref 4.22–5.81)
RDW: 13.8 % (ref 11.5–15.5)
WBC: 5.2 10*3/uL (ref 4.0–10.5)

## 2016-12-22 MED ORDER — IPRATROPIUM-ALBUTEROL 0.5-2.5 (3) MG/3ML IN SOLN
3.0000 mL | RESPIRATORY_TRACT | Status: DC | PRN
Start: 2016-12-22 — End: 2016-12-23
  Administered 2016-12-22: 3 mL via RESPIRATORY_TRACT
  Filled 2016-12-22: qty 3

## 2016-12-22 NOTE — Consult Note (Signed)
[facility] CM Primary Care Navigator  12/22/2016  Evan [name] July 25, 1927 [id number]   Went to see patientat the bedside to identify possible discharge needs and met with [name]) and sister in-law Evan [name]) as well. Patient gave permission to discuss health issues during this visit. Sister in-lawreports patient was having chest pain and difficulty breathing that led to this admission.  Patient endorses Evan [name]  [facility] as his primary care provider.  Patientand sister in-law stated[facility] [place] and [place] [facility] to obtain medications without any problem. Patient shares that daughter Evan [name])manages his medications at home using "pill box" system filled weekly.  Patient reportsthat he has been driving prior to admission but daughter Evan [name]) usually providestransportation and brings him to hisdoctors'appointments.  Sister in-law reports that patient lives with another daughter Evan [name]) at home but Evan [name] (lives close by) serves as the primary caregiver for patient.  Anticipated discharge plan is home according to patient.  Patientand sister in-law expressed understanding to call primary care provider's office when he returnshome for a post discharge follow-up appointment within a week or sooner if needed. Patient letter (with PCP's contact number) was provided as their reminder.  Explained to patientand sister in-law about [facility] CM services available for health management at home but he deniesany current needs or concerns at this time. However, he had opted and verbally agreed forEMMI calls tofollow-up with his recoveryat home.   Referral to [facility] calls made after discharge.  Patient and sister in-law voiced understanding to seek referral from primary care provider to [facility] care management if necessary and appropriate for services  in the future.  [facility] care management information provided for future needs that he may have.  For questions, please contact:  Dannielle Huh, BSN, RN- [facility] Primary Care Navigator  Telephone: [phone] [place]

## 2016-12-22 NOTE — Plan of Care (Signed)
Problem: Pain Managment: Goal: General experience of comfort will improve Outcome: Progressing Pt verbalizes increased comfort and pain relief.

## 2016-12-22 NOTE — Consult Note (Signed)
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 After workup cardiology feels the CP may be pulmonary in origin; therefore Pulmonary is now being consulted. Regarding patient's lung cancer, when he was first diagnosed, he had CT Surgery consult who felt patient was not a good candidate for lobectomy given his advanced age, limited mobility, and chronic dyspnea. It was also questioned at that time whether this was indeed stage I or stage III, as he had positive mediastinal and hilar lymph nodes on PET scan but does not appear to have gotten an EBUS TBNA of those nodes. On my exam today he says he has had cough since December which was initially diagnosed as pneumonia and treated. The cough improved but then recurred in June. Initially it was nonproductive but is now productive of "Cure" sputum. He also c/o 5/10 "tight" left-sided CP just under his left breast that has ben intermittent since June 2018. It occurs typically "a couple" times per week, lasts 1-2 hours each time, is not exertional, does not resolve with rest, is not pleuritic, and is not related to eating or fasting. He cannot remember anything he does that makes it improve. He is not sure if it is reproducible to palpation. He does not have the CP at the time of my exam. He admits to "a little" SOB on occasion. His chart lists asthma but he adamantly denies this and says he is not on any inhalers at home.    PAST MEDICAL HISTORY :  He  has a past medical history of Adenocarcinoma of  right lung ([place]) (01/06/2016); Anginal chest pain at rest [facility]); Anxiety; Arthritis; BPH (benign prostatic hyperplasia); CAD (coronary artery disease); Chronic lower back pain; Complication of anesthesia; Depression; DJD (degenerative joint disease); Esophageal ulcer without bleeding; Family history of adverse reaction to anesthesia; GERD (gastroesophageal reflux disease); Headache; Hemochromatosis; Hepatitis; History of ASCVD; Hyperlipidemia; Hypertension; Myocardial infarction ([place]) (1999); Osteoarthritis; Pneumonia; Rheumatoid arthritis ([place]); and Subdural hematoma ([place]).  PAST SURGICAL HISTORY: He  has a past surgical history that includes Cholecystectomy open; Shoulder open rotator cuff repair (Right); Total knee arthroplasty (Bilateral); Coronary artery bypass graft (1999); Descending aortic aneurysm repair w/ stent; Esophagogastroduodenoscopy (N/A, 11/09/2014); Inguinal hernia repair; Joint replacement; Nasal sinus surgery; Arm skin lesion biopsy / excision (Left, 09/02/2015); Coronary angioplasty with stent; Cataract extraction w/ intraocular lens  implant, bilateral (Bilateral); and Corneal transplant (Bilateral).  Allergies  Allergen Reactions  . Feldene [Piroxicam] Other (See Comments)    REACTION: Blisters  . Statins Other (See Comments)    Myalgias  . Doxycycline Other (See Comments)    REACTION:  unknown  . Latex Rash  . Tequin Anxiety  . Valium Other (See Comments)    REACTION:  unknown    No current facility-administered medications on file prior to encounter.    Current Outpatient Prescriptions on File Prior to Encounter  Medication Sig  . albuterol (VENTOLIN HFA) 108 (90 Base) MCG/ACT inhaler Inhale 1-2 puffs into the lungs every 6 (six) hours as needed for wheezing or shortness of breath.  Marland Kitchen  amoxicillin (AMOXIL) 500 MG capsule Take 2,000 mg by mouth See admin instructions. Take 4 capsules (2000 mg) by mouth one hour prior to dental appointments  . atenolol (TENORMIN) 50 MG  tablet Take 1 tablet (50 mg total) by mouth daily. Please call and schedule follow up. [phone] (Patient taking differently: Take 25 mg by mouth daily. Please call and schedule follow up. [phone])  . Cholecalciferol 2000 units TABS Take 1 tablet (2,000 Units total) by mouth daily.  . clopidogrel (PLAVIX) 75 MG tablet Take 1 tablet (75 mg total) by mouth daily.  Marland Kitchen FLUoxetine (PROZAC) 20 MG capsule TAKE ONE CAPSULE BY MOUTH DAILY (Patient taking differently: TAKE 20 mg  BY MOUTH DAILY)  . leflunomide (ARAVA) 10 MG tablet Take 10 mg by mouth daily.   Marland Kitchen Lifitegrast (XIIDRA) 5 % SOLN Place 1-2 drops into both eyes daily.  Marland Kitchen LORazepam (ATIVAN) 1 MG tablet TAKE ONE (1) TABLET BY MOUTH 3 TIMES DAILY (Patient taking differently: Take 1mg  by mouth every morning; Take 0.5 mg at lunch; Take 1mg  at bedtime)  . nitroGLYCERIN (NITROSTAT) 0.4 MG SL tablet Place 1 tablet (0.4 mg total) under the tongue every 5 (five) minutes as needed for chest pain.  . pantoprazole (PROTONIX) 40 MG tablet Take 1 tablet (40 mg total) by mouth daily.  . rosuvastatin (CRESTOR) 5 MG tablet TAKE 1 TABLET EVERY DAY(NEED TO SCHEDULE YEARLY APPOINTMENT FOR AUGUST ) (Patient taking differently: TAKE 5 mg  EVERY DAY(NEED TO SCHEDULE YEARLY APPOINTMENT FOR AUGUST ))  . tamsulosin (FLOMAX) 0.4 MG CAPS capsule TAKE 1 CAPSULE EVERY DAY (Patient taking differently: TAKE 0.4 gm by mouth EVERY DAY)  . Spacer/Aero-Holding Chambers (AEROCHAMBER PLUS WITH MASK) inhaler Use as instructed   FAMILY HISTORY:  His indicated that his mother is deceased. He indicated that his father is deceased. He indicated that his sister is alive. He indicated that the status of his brother is unknown. He indicated that his maternal grandmother is deceased. He indicated that his maternal grandfather is deceased. He indicated that his paternal grandmother is deceased. He indicated that his paternal grandfather is deceased. He indicated that the status of his neg hx  is unknown. He indicated that his other is deceased.   SOCIAL HISTORY: He  reports that he has never smoked. He has quit using smokeless tobacco. His smokeless tobacco use included Chew. He reports that he does not drink alcohol or use drugs.  REVIEW OF SYSTEMS:   Review of Systems  Constitutional: Negative for chills, fever and weight loss.  HENT: Negative.   Eyes: Negative.   Respiratory: Positive for cough, sputum production and shortness of breath. Negative for wheezing.   Cardiovascular: Positive for chest pain.  Gastrointestinal: Negative.  Negative for heartburn.  Genitourinary: Negative.   Musculoskeletal: Negative.   Skin: Negative.   Neurological: Negative.   Endo/Heme/Allergies: Negative.   Psychiatric/Behavioral: Negative.    VITAL SIGNS: BP (!) 101/58   Pulse (!) 53   Temp 97.7 F (36.5 C) (Oral)   Resp 16   Ht 5\' 8"  (1.727 m)   Wt 78.1 kg (172 lb 1.6 oz)   SpO2 97%   BMI 26.17 kg/m   INTAKE / OUTPUT: I/O last 3 completed shifts: In: 604.3 [P.O.:462; I.V.:142.3] Out: 775 [Urine:775]  PHYSICAL EXAMINATION: General: WDWN Elderly male, lying in bed, in NAD Neuro: AAOx3, moving all extremities HEENT: OP clear, MM moist, PERRL, EOMI Cardiovascular: RRR no m/r/g Lungs: CTA b/l, no accessory muscle use, speaking in full sentences, Pox  96% on 2LO2 Abdomen: Soft NTND, BS+ Musculoskeletal: no LE edema Skin: no rashes   LABS:  BMET  Recent Labs Lab 12/20/16 0827 12/21/16 0211 12/21/16 1656 12/22/16 0458  NA 139 137  --  137  K 4.2 3.8  --  3.9  CL 108 109  --  107  CO2 24 20*  --  23  BUN 15 19  --  19  CREATININE 1.15 1.18 1.18 1.16  GLUCOSE 115* 98  --  92   Electrolytes  Recent Labs Lab 12/20/16 0827 12/20/16 1651 12/21/16 0211 12/22/16 0458  CALCIUM 9.4  --  8.8* 8.4*  MG  --  1.9  --   --    CBC  Recent Labs Lab 12/21/16 0211 12/21/16 1656 12/22/16 0458  WBC 4.6 4.9 5.2  HGB 12.2* 13.2 12.1*  HCT 37.0* 39.2 37.2*  PLT 95*  95* 88*   Coag's No results for input(s): APTT, INR in the last 168 hours.  Sepsis Markers No results for input(s): LATICACIDVEN, PROCALCITON, O2SATVEN in the last 168 hours.  ABG No results for input(s): PHART, PCO2ART, PO2ART in the last 168 hours.  Liver Enzymes No results for input(s): AST, ALT, ALKPHOS, BILITOT, ALBUMIN in the last 168 hours.  Cardiac Enzymes  Recent Labs Lab 12/20/16 1651 12/20/16 2114 12/21/16 0211  TROPONINI <0.03 <0.03 <0.03    Glucose No results for input(s): GLUCAP in the last 168 hours.  Imaging Ct Chest Wo Contrast  Result Date: 12/22/2016 CLINICAL DATA:  Short of breath history of myocardial infarct EXAM: CT CHEST WITHOUT CONTRAST TECHNIQUE: Multidetector CT imaging of the chest was performed following the standard protocol without IV contrast. COMPARISON:  Radiograph 12/20/2016, CT 10/04/2016, 05/21/2016 FINDINGS: Cardiovascular: Limited evaluation without intravenous contrast. Moderate atherosclerotic calcifications of the aorta. Maximum AP diameter of the ascending aorta of 3.9 cm. Coronary artery calcifications. Borderline cardiomegaly. No large pericardial effusion. Post CABG changes. Mediastinum/Nodes: Limited evaluation for hilar adenopathy. Stable scattered subcentimeter lymph nodes in the mediastinum. Midline trachea. No thyroid mass. Esophagus within normal limits. Lungs/Pleura: Mild bilateral subpleural linear densities, may reflect mild changes of fibrosis. No pneumothorax. Small right-sided pleural effusion. Slight increased consolidation in the right lower lobe since the prior CT. Rounded masslike appearance in the posterior, medial right lung base in the location of prior tumor. This measures approximate 3.5 cm transverse. Upper Abdomen: Surgical clips in the right upper quadrant. Calcification at the right dome of the liver unchanged. No acute abnormality. Musculoskeletal: Sternotomy changes. Degenerative changes of the spine. No acute or  suspicious lesion IMPRESSION: 1. Small right pleural effusion with slight increased consolidation in the right lower lobe which may reflect a pneumonia. There is a more rounded masslike area of density in the posteromedial right lung base at the site of the patient's prior lung mass, cannot exclude a recurrent mass in the region and attention to this region on follow-up is recommended. 2. Slight increased subpleural linear interstitial densities may reflect mild edema or early changes of fibrosis. Aortic Atherosclerosis (ICD10-I70.0). Electronically Signed   By: Donavan Foil M.D.   On: 12/22/2016 00:11   STUDIES:  CXR (12/20/16): IMPRESSION: No acute cardiopulmonary findings. Chronic bronchitic type interstitial lung changes and scarring. Chest CT w/o Contrast (12/22/16): IMPRESSION: 1. Small right pleural effusion with slight increased consolidation in the right lower lobe which may reflect a pneumonia. There is a more rounded masslike area of density in the posteromedial right lung base at the site of the patient's prior lung  mass, cannot exclude a recurrent mass in the region and attention to this region on follow-up is recommended. 2. Slight increased subpleural linear interstitial densities may reflect mild edema or early changes of fibrosis. TTE (12/21/16):  - Left ventricle: The cavity size was normal. There was moderate concentric hypertrophy. Systolic function was normal. The   estimated ejection fraction was in the range of 60% to 65%. Wall motion was normal; there were no regional wall motion   abnormalities. - Aortic valve: Severely calcified annulus. Trileaflet; mildly thickened, mildly calcified leaflets. There was mild stenosis.   Valve area (VTI): 1.38 cm^2. Valve area (Vmax): 1.08 cm^2. Valve area (Vmean): 1.43 cm^2. - Aorta: Aortic root dimension: 40 mm (ED). - Aortic root: The aortic root was mildly dilated. - Mitral valve: Calcified annulus. There was mild regurgitation. - Left  atrium: The atrium was severely dilated. - Right atrium: The atrium was moderately dilated. - Tricuspid valve: There was mild regurgitation. - Pulmonary arteries: PA peak pressure: 32 mm Hg (S). - Pericardium, extracardiac: A trivial, free-flowing pericardial effusion was identified posterior to the heart. The fluid had no   internal echoes.  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 As part of his workup for his CP he had had EKG's, Troponins, and a TTE, all of which have been unrevealing. The CP is very atypical given that it is intermittent nonexertional and lasts for 1-2 hours. He does describe the CP as "tightness", and his chart does list a diagnosis of asthma, which he denies. Certainly, the PFT's available for my review were not consistent with asthma. A spirometry from 05/21/16 that was suggestive of restriction, but no lung volumes at that time to confirm it. Complete PFT's in 12/22/15 showed normal spirometry, normal lung volumes, and impaired diffusion. The impaired diffusion is due to his pulmonary fibrosis and tobacco abuse (quit smoking 07/2016). However, lack of obstruction on spirometry does not completely rule out asthma. And it is not unreasonable to try a nebulizer treatment during the CP to see if it improves the pain. Ihave reviewed his CXR and multiple Chest CT's. He has mild pulmonary fibrosis on my review, as evidenced by subpleural reticulation, increased interstitial markings, and mild bronchiectasis. This pulmonary fibrosis was present prior to radiation but has worsened slightly since radiation; it is still overall very mild though. He c/o cough x 3 months which is now increasingly productive of sputum. While the Chest CT  does show his known posterior RLL lung mass, I do not appreciate any new areas of infiltrate consistent with pneumonia. Discussed with patient that bronchiectasis can become chronically colonized with bacteria that cause increased chronic productive cough. Will obtain sputum culture. I think it is unlikely that this CP is from a PE as he is not hypoxic, is not tachycardic, has no increased work of breathing at the time of my exam, and has a PASP of only 24mmHg on this admission's TTE with normal RV size and function. He also had a recent CTA on 05/21/16 which showed no PE. If his sputum culture is no growth and the nebs do not help his chest pain (which he was not having at the time of my exam), then would consider further cardiac workup including possible stress test.  60 minutes spent on this consult.   Vernie Murders, MD  [facility] [place] Pager: [phone]  12/22/2016, 12:19 PM

## 2016-12-22 NOTE — Progress Notes (Signed)
DAILY PROGRESS NOTE   Patient Name: Evan [name] Date of Encounter: 12/22/2016  Hospital Problem List   Principal Problem:   Chest pain at rest Active Problems:   Hyperlipidemia   Hypertension   S/P CABG (coronary artery bypass graft)   Adenocarcinoma of right lung [facility])   Atrial fibrillation ([place])   Unstable angina [facility]    Chief Complaint   "Slept like a rock"- no chest pain overnight  Subjective   [name] ruled out for MI. Echo shows stable LVEF and mild AS. There is a new, small pericardial effusion. He does not report his symptoms are worse with lying down or sitting up. Pain relieved with morphine, and addition of colchicine yesterday.  History of lung adenocarcinoma with post radiation pneumonitis. Pulmonary consult pending after repeat chest CT last pm.   Objective   Vitals:   12/21/16 1253 12/21/16 1321 12/21/16 2108 12/22/16 0605  BP: (!) 165/66 (!) 148/92 120/73 127/68  Pulse: 73 63 (!) 53 (!) 53  Resp:  (!) 65 18 16  Temp:  97.9 F (36.6 C) 97.6 F (36.4 C) 97.7 F (36.5 C)  TempSrc:  Oral Oral Oral  SpO2:  97% 98% 97%  Weight:    172 lb 1.6 oz (78.1 kg)  Height:        Intake/Output Summary (Last 24 hours) at 12/22/16 0856 Last data filed at 12/22/16 0000  Gross per 24 hour  Intake              462 ml  Output              200 ml  Net              262 ml   Filed Weights   12/20/16 1715 12/21/16 0500 12/22/16 0605  Weight: 173 lb 15.1 oz (78.9 kg) 168 lb 11.2 oz (76.5 kg) 172 lb 1.6 oz (78.1 kg)    Physical Exam   General appearance: alert and no distress, on O2 Neck: no carotid bruit, no JVD  Lungs: few crackles Rt base Heart: irregularly irregular rhythm, no rub Abdomen: soft, non-tender; bowel sounds normal; no masses,  no organomegaly Extremities: extremities normal, atraumatic, no cyanosis or edema Pulses: 2+ and symmetric Skin: Skin color, texture, turgor normal. No rashes or lesions Neurologic: Grossly normal Psych:  Pleasant  Inpatient Medications    Scheduled Meds: . aspirin  650 mg Oral TID  . atenolol  25 mg Oral Daily  . cholecalciferol  2,000 Units Oral Daily  . colchicine  0.6 mg Oral BID  . enoxaparin (LOVENOX) injection  40 mg Subcutaneous Q24H  . FLUoxetine  20 mg Oral Daily  . Influenza vac split quadrivalent PF  0.5 mL Intramuscular Tomorrow-1000  . leflunomide  10 mg Oral Daily  . LORazepam  0.5 mg Oral Q lunch  . LORazepam  1 mg Oral BID  . nitroGLYCERIN  1 inch Topical Q6H  . pantoprazole  40 mg Oral Daily  . rosuvastatin  5 mg Oral Daily  . tamsulosin  0.4 mg Oral Daily  . vitamin B-12  500 mcg Oral Daily    Continuous Infusions: . sodium chloride 10 mL/hr at 12/20/16 2128    PRN Meds: acetaminophen, morphine injection, ondansetron (ZOFRAN) IV, polyvinyl alcohol   Labs   Results for orders placed or performed during the hospital encounter of 12/20/16 (from the past 48 hour(s))  TSH     Status: None   Collection Time: 12/20/16  3:13  PM  Result Value Ref Range   TSH 1.003 0.350 - 4.500 uIU/mL    Comment: Performed by a 3rd Generation assay with a functional sensitivity of <=0.01 uIU/mL.  Troponin I     Status: None   Collection Time: 12/20/16  4:51 PM  Result Value Ref Range   Troponin I <0.03 <0.03 ng/mL  Magnesium     Status: None   Collection Time: 12/20/16  4:51 PM  Result Value Ref Range   Magnesium 1.9 1.7 - 2.4 mg/dL  T4, free     Status: None   Collection Time: 12/20/16  4:51 PM  Result Value Ref Range   Free T4 0.76 0.61 - 1.12 ng/dL    Comment: (NOTE) Biotin ingestion may interfere with free T4 tests. If the results are inconsistent with the TSH level, previous test results, or the clinical presentation, then consider biotin interference. If needed, order repeat testing after stopping biotin.   Troponin I     Status: None   Collection Time: 12/20/16  9:14 PM  Result Value Ref Range   Troponin I <0.03 <0.03 ng/mL  Troponin I     Status: None    Collection Time: 12/21/16  2:11 AM  Result Value Ref Range   Troponin I <0.03 <0.03 ng/mL  Heparin level (unfractionated)     Status: None   Collection Time: 12/21/16  2:11 AM  Result Value Ref Range   Heparin Unfractionated 0.31 0.30 - 0.70 IU/mL    Comment:        IF HEPARIN RESULTS ARE BELOW EXPECTED VALUES, AND PATIENT DOSAGE HAS BEEN CONFIRMED, SUGGEST FOLLOW UP TESTING OF ANTITHROMBIN III LEVELS.   CBC     Status: Abnormal   Collection Time: 12/21/16  2:11 AM  Result Value Ref Range   WBC 4.6 4.0 - 10.5 K/uL   RBC 4.07 (L) 4.22 - 5.81 MIL/uL   Hemoglobin 12.2 (L) 13.0 - 17.0 g/dL   HCT 37.0 (L) 39.0 - 52.0 %   MCV 90.9 78.0 - 100.0 fL   MCH 30.0 26.0 - 34.0 pg   MCHC 33.0 30.0 - 36.0 g/dL   RDW 13.8 11.5 - 15.5 %   Platelets 95 (L) 150 - 400 K/uL    Comment: CONSISTENT WITH PREVIOUS RESULT  Basic metabolic panel     Status: Abnormal   Collection Time: 12/21/16  2:11 AM  Result Value Ref Range   Sodium 137 135 - 145 mmol/L   Potassium 3.8 3.5 - 5.1 mmol/L   Chloride 109 101 - 111 mmol/L   CO2 20 (L) 22 - 32 mmol/L   Glucose, Bld 98 65 - 99 mg/dL   BUN 19 6 - 20 mg/dL   Creatinine, Ser 1.18 0.61 - 1.24 mg/dL   Calcium 8.8 (L) 8.9 - 10.3 mg/dL   GFR calc non Af Amer 54 (L) >60 mL/min   GFR calc Af Amer >60 >60 mL/min    Comment: (NOTE) The eGFR has been calculated using the CKD EPI equation. This calculation has not been validated in all clinical situations. eGFR's persistently <60 mL/min signify possible Chronic Kidney Disease.    Anion gap 8 5 - 15  Lipid panel     Status: Abnormal   Collection Time: 12/21/16  2:11 AM  Result Value Ref Range   Cholesterol 113 0 - 200 mg/dL   Triglycerides 98 <150 mg/dL   HDL 28 (L) >40 mg/dL   Total CHOL/HDL Ratio 4.0 RATIO   VLDL 20 0 -  40 mg/dL   LDL Cholesterol 65 0 - 99 mg/dL    Comment:        Total Cholesterol/HDL:CHD Risk Coronary Heart Disease Risk Table                     Men   Women  1/2 Average Risk   3.4    3.3  Average Risk       5.0   4.4  2 X Average Risk   9.6   7.1  3 X Average Risk  23.4   11.0        Use the calculated Patient Ratio above and the CHD Risk Table to determine the patient's CHD Risk.        ATP III CLASSIFICATION (LDL):  <100     mg/dL   Optimal  100-129  mg/dL   Near or Above                    Optimal  130-159  mg/dL   Borderline  160-189  mg/dL   High  >190     mg/dL   Very High   Heparin level (unfractionated)     Status: None   Collection Time: 12/21/16 11:16 AM  Result Value Ref Range   Heparin Unfractionated 0.33 0.30 - 0.70 IU/mL    Comment:        IF HEPARIN RESULTS ARE BELOW EXPECTED VALUES, AND PATIENT DOSAGE HAS BEEN CONFIRMED, SUGGEST FOLLOW UP TESTING OF ANTITHROMBIN III LEVELS.   CBC     Status: Abnormal   Collection Time: 12/21/16  4:56 PM  Result Value Ref Range   WBC 4.9 4.0 - 10.5 K/uL   RBC 4.20 (L) 4.22 - 5.81 MIL/uL   Hemoglobin 13.2 13.0 - 17.0 g/dL   HCT 39.2 39.0 - 52.0 %   MCV 93.3 78.0 - 100.0 fL   MCH 31.4 26.0 - 34.0 pg   MCHC 33.7 30.0 - 36.0 g/dL   RDW 14.3 11.5 - 15.5 %   Platelets 95 (L) 150 - 400 K/uL    Comment: SPECIMEN CHECKED FOR CLOTS REPEATED TO VERIFY CONSISTENT WITH PREVIOUS RESULT   Creatinine, serum     Status: Abnormal   Collection Time: 12/21/16  4:56 PM  Result Value Ref Range   Creatinine, Ser 1.18 0.61 - 1.24 mg/dL   GFR calc non Af Amer 54 (L) >60 mL/min   GFR calc Af Amer >60 >60 mL/min    Comment: (NOTE) The eGFR has been calculated using the CKD EPI equation. This calculation has not been validated in all clinical situations. eGFR's persistently <60 mL/min signify possible Chronic Kidney Disease.   CBC     Status: Abnormal   Collection Time: 12/22/16  4:58 AM  Result Value Ref Range   WBC 5.2 4.0 - 10.5 K/uL   RBC 4.00 (L) 4.22 - 5.81 MIL/uL   Hemoglobin 12.1 (L) 13.0 - 17.0 g/dL   HCT 37.2 (L) 39.0 - 52.0 %   MCV 93.0 78.0 - 100.0 fL   MCH 30.3 26.0 - 34.0 pg   MCHC 32.5 30.0 -  36.0 g/dL   RDW 13.8 11.5 - 15.5 %   Platelets 88 (L) 150 - 400 K/uL    Comment: CONSISTENT WITH PREVIOUS RESULT  Basic metabolic panel     Status: Abnormal   Collection Time: 12/22/16  4:58 AM  Result Value Ref Range   Sodium 137 135 - 145 mmol/L   Potassium  3.9 3.5 - 5.1 mmol/L   Chloride 107 101 - 111 mmol/L   CO2 23 22 - 32 mmol/L   Glucose, Bld 92 65 - 99 mg/dL   BUN 19 6 - 20 mg/dL   Creatinine, Ser 1.16 0.61 - 1.24 mg/dL   Calcium 8.4 (L) 8.9 - 10.3 mg/dL   GFR calc non Af Amer 55 (L) >60 mL/min   GFR calc Af Amer >60 >60 mL/min    Comment: (NOTE) The eGFR has been calculated using the CKD EPI equation. This calculation has not been validated in all clinical situations. eGFR's persistently <60 mL/min signify possible Chronic Kidney Disease.    Anion gap 7 5 - 15    ECG   A-fib at 64 - Personally Reviewed  Telemetry   A-fib with CVR - Personally Reviewed  Radiology    Dg Chest 2 View  Result Date: 12/20/2016 CLINICAL DATA:  Chest pain and shortness of breath. EXAM: CHEST  2 VIEW COMPARISON:  10/11/2016 FINDINGS: The heart is normal in size and stable. Stable tortuosity and calcification of the thoracic aorta. Stable surgical changes from bypass surgery. Chronic bronchitic type interstitial lung changes and pulmonary scarring. No infiltrates, edema or effusions. The bony thorax is intact. IMPRESSION: No acute cardiopulmonary findings. Chronic bronchitic type interstitial lung changes and scarring. Electronically Signed   By: Marijo Sanes M.D.   On: 12/20/2016 09:03   Ct Chest Wo Contrast  Result Date: 12/22/2016 CLINICAL DATA:  Short of breath history of myocardial infarct EXAM: CT CHEST WITHOUT CONTRAST TECHNIQUE: Multidetector CT imaging of the chest was performed following the standard protocol without IV contrast. COMPARISON:  Radiograph 12/20/2016, CT 10/04/2016, 05/21/2016 FINDINGS: Cardiovascular: Limited evaluation without intravenous contrast. Moderate  atherosclerotic calcifications of the aorta. Maximum AP diameter of the ascending aorta of 3.9 cm. Coronary artery calcifications. Borderline cardiomegaly. No large pericardial effusion. Post CABG changes. Mediastinum/Nodes: Limited evaluation for hilar adenopathy. Stable scattered subcentimeter lymph nodes in the mediastinum. Midline trachea. No thyroid mass. Esophagus within normal limits. Lungs/Pleura: Mild bilateral subpleural linear densities, may reflect mild changes of fibrosis. No pneumothorax. Small right-sided pleural effusion. Slight increased consolidation in the right lower lobe since the prior CT. Rounded masslike appearance in the posterior, medial right lung base in the location of prior tumor. This measures approximate 3.5 cm transverse. Upper Abdomen: Surgical clips in the right upper quadrant. Calcification at the right dome of the liver unchanged. No acute abnormality. Musculoskeletal: Sternotomy changes. Degenerative changes of the spine. No acute or suspicious lesion IMPRESSION: 1. Small right pleural effusion with slight increased consolidation in the right lower lobe which may reflect a pneumonia. There is a more rounded masslike area of density in the posteromedial right lung base at the site of the patient's prior lung mass, cannot exclude a recurrent mass in the region and attention to this region on follow-up is recommended. 2. Slight increased subpleural linear interstitial densities may reflect mild edema or early changes of fibrosis. Aortic Atherosclerosis (ICD10-I70.0). Electronically Signed   By: Donavan Foil M.D.   On: 12/22/2016 00:11    Cardiac Studies   LV EF: 60% -   65%  ------------------------------------------------------------------- Indications:      Atrial fibrillation - 427.31.  ------------------------------------------------------------------- History:   PMH:   Coronary artery disease.  PMH:   Myocardial infarction.  Risk factors:  Hypertension.  Dyslipidemia.  ------------------------------------------------------------------- Study Conclusions  - Left ventricle: The cavity size was normal. There was moderate   concentric hypertrophy. Systolic function was  normal. The   estimated ejection fraction was in the range of 60% to 65%. Wall   motion was normal; there were no regional wall motion   abnormalities. - Aortic valve: Severely calcified annulus. Trileaflet; mildly   thickened, mildly calcified leaflets. There was mild stenosis.   Valve area (VTI): 1.38 cm^2. Valve area (Vmax): 1.08 cm^2. Valve   area (Vmean): 1.43 cm^2. - Aorta: Aortic root dimension: 40 mm (ED). - Aortic root: The aortic root was mildly dilated. - Mitral valve: Calcified annulus. There was mild regurgitation. - Left atrium: The atrium was severely dilated. - Right atrium: The atrium was moderately dilated. - Tricuspid valve: There was mild regurgitation. - Pulmonary arteries: PA peak pressure: 32 mm Hg (S). - Pericardium, extracardiac: A trivial, free-flowing pericardial   effusion was identified posterior to the heart. The fluid had no   internal echoes.  Assessment   Principal Problem:   Chest pain at rest Active Problems:   Hyperlipidemia   Hypertension   S/P CABG (coronary artery bypass graft)   Adenocarcinoma of right lung (HCC)   Atrial fibrillation (H[facility])   Unstable angina ([place])   Plan   1. Pt has ruled out for MI.  Could this be related to prior lung cancer. Pulmonary consult called.    Kerin Ransom 12/22/2016, 8:56 AM

## 2016-12-23 ENCOUNTER — Ambulatory Visit: Payer: Medicare HMO | Admitting: Pulmonary Disease

## 2016-12-23 DIAGNOSIS — Z8679 Personal history of other diseases of the circulatory system: Secondary | ICD-10-CM

## 2016-12-23 DIAGNOSIS — I309 Acute pericarditis, unspecified: Secondary | ICD-10-CM

## 2016-12-23 DIAGNOSIS — J441 Chronic obstructive pulmonary disease with (acute) exacerbation: Secondary | ICD-10-CM

## 2016-12-23 DIAGNOSIS — R05 Cough: Secondary | ICD-10-CM

## 2016-12-23 DIAGNOSIS — Z23 Encounter for immunization: Secondary | ICD-10-CM | POA: Diagnosis not present

## 2016-12-23 LAB — CBC
HCT: 35.8 % — ABNORMAL LOW (ref 39.0–52.0)
Hemoglobin: 11.6 g/dL — ABNORMAL LOW (ref 13.0–17.0)
MCH: 30.1 pg (ref 26.0–34.0)
MCHC: 32.4 g/dL (ref 30.0–36.0)
MCV: 93 fL (ref 78.0–100.0)
Platelets: 93 10*3/uL — ABNORMAL LOW (ref 150–400)
RBC: 3.85 MIL/uL — ABNORMAL LOW (ref 4.22–5.81)
RDW: 13.8 % (ref 11.5–15.5)
WBC: 5.2 10*3/uL (ref 4.0–10.5)

## 2016-12-23 LAB — BASIC METABOLIC PANEL
Anion gap: 7 (ref 5–15)
BUN: 21 mg/dL — ABNORMAL HIGH (ref 6–20)
CO2: 22 mmol/L (ref 22–32)
Calcium: 8.4 mg/dL — ABNORMAL LOW (ref 8.9–10.3)
Chloride: 107 mmol/L (ref 101–111)
Creatinine, Ser: 1.39 mg/dL — ABNORMAL HIGH (ref 0.61–1.24)
GFR calc Af Amer: 51 mL/min — ABNORMAL LOW (ref >60)
GFR calc non Af Amer: 44 mL/min — ABNORMAL LOW (ref >60)
Glucose, Bld: 108 mg/dL — ABNORMAL HIGH (ref 65–99)
Potassium: 4.4 mmol/L (ref 3.5–5.1)
Sodium: 136 mmol/L (ref 135–145)

## 2016-12-23 LAB — EXPECTORATED SPUTUM ASSESSMENT W GRAM STAIN, RFLX TO RESP C

## 2016-12-23 MED ORDER — LORAZEPAM 1 MG PO TABS
ORAL_TABLET | ORAL | 0 refills | Status: DC
Start: 2016-12-23 — End: 2017-01-16

## 2016-12-23 MED ORDER — COLCHICINE 0.6 MG PO TABS: 0.6000 mg | ORAL_TABLET | Freq: Two times a day (BID) | ORAL | 0 refills | Status: DC

## 2016-12-23 MED ORDER — IPRATROPIUM-ALBUTEROL 0.5-2.5 (3) MG/3ML IN SOLN: 3.0000 mL | RESPIRATORY_TRACT | 3 refills | Status: DC | PRN

## 2016-12-23 MED ORDER — FLUOXETINE HCL 20 MG PO CAPS: ORAL_CAPSULE | ORAL | 1 refills | Status: DC

## 2016-12-23 MED ORDER — ROSUVASTATIN CALCIUM 5 MG PO TABS: ORAL_TABLET | ORAL | 3 refills | Status: DC

## 2016-12-23 MED ORDER — ATENOLOL 50 MG PO TABS: 25.0000 mg | ORAL_TABLET | Freq: Every day | ORAL | Status: DC

## 2016-12-23 MED ORDER — SODIUM CHLORIDE 0.9 % IV BOLUS (SEPSIS)
500.0000 mL | Freq: Once | INTRAVENOUS | Status: AC
  Administered 2016-12-23: 500 mL via INTRAVENOUS

## 2016-12-23 MED ORDER — NITROGLYCERIN 0.4 MG SL SUBL: 0.4000 mg | SUBLINGUAL_TABLET | SUBLINGUAL | 2 refills | Status: DC | PRN

## 2016-12-23 MED ORDER — TAMSULOSIN HCL 0.4 MG PO CAPS: ORAL_CAPSULE | ORAL | 3 refills | Status: DC

## 2016-12-23 MED ORDER — ASPIRIN 325 MG PO TABS: 650.0000 mg | ORAL_TABLET | Freq: Three times a day (TID) | ORAL | Status: AC

## 2016-12-23 NOTE — Evaluation (Signed)
Physical Therapy Evaluation and Discharge Patient Details Name: Evan [name] MRN: [id number] DOB: [birth year] Today's Date: 12/23/2016   History of Present Illness  Pt is an 83 y/o M with history of Adenocarcinoma of the posterior RLL (diagnosed in 01/2016) s/p radiation therapy, CAD s/p CABG, Chronic back pain, GERD, HTN, RA, SDH, ?Esophageal ulcer vs stricture, who is admitted 12/20/16 with CP and found at that time to have Afib with slow ventricular response. After workup cardiology feels the CP may be pulmonary in origin.  Clinical Impression  Patient evaluated by Physical Therapy with no further acute PT needs identified. All education has been completed and the patient has no further questions. At the time of PT eval pt was able to perform transfers and ambulation with gross supervision for safety and occasional min guard assist for ambulation. Pt reports no falls at home and demonstrates 5/5 strength bilaterally in LE's, however noted some shakiness in LE's during gait training (no buckling). Feel this pt could benefit from HHPT follow up at d/c, however no further acute PT needs at this time. See below for any follow-up Physical Therapy or equipment needs. PT is signing off. Thank you for this referral.     Follow Up Recommendations Home health PT;Supervision for mobility/OOB    Equipment Recommendations  None recommended by PT    Recommendations for Other Services       Precautions / Restrictions Precautions Precautions: Fall Restrictions Weight Bearing Restrictions: No      Mobility  Bed Mobility Overal bed mobility: Needs Assistance Bed Mobility: Supine to Sit;Sit to Supine     Supine to sit: Modified independent (Device/Increase time) Sit to supine: Modified independent (Device/Increase time)   General bed mobility comments: No assist required. Pt was able to transition to/from EOB with minimal to no use of rails.   Transfers Overall transfer level: Needs  assistance Equipment used: None Transfers: Sit to/from Stand Sit to Stand: Supervision         General transfer comment: Gross supervision for safety. No assist required and no unsteadiness/LOB noted.   Ambulation/Gait Ambulation/Gait assistance: Min guard Ambulation Distance (Feet): 50 Feet Assistive device: None (Held to IV pole) Gait Pattern/deviations: Step-through pattern;Decreased stride length;Trunk flexed Gait velocity: Decreased Gait velocity interpretation: Below normal speed for age/gender General Gait Details: Noted LE's a little shaky at times but no buckling of knees. Pt with fluctuating O2 sats however mostly in 90's. Unclear whether occasional drop in sats was accurate as the waveform was not good.   Stairs            Wheelchair Mobility    Modified Rankin (Stroke Patients Only)       Balance Overall balance assessment: Needs assistance Sitting-balance support: Feet supported;No upper extremity supported Sitting balance-[name] Scale: Fair     Standing balance support: No upper extremity supported;During functional activity Standing balance-[name] Scale: Fair                               Pertinent Vitals/Pain Pain Assessment: No/denies pain    Home Living Family/patient expects to be discharged to:: Private residence Living Arrangements: Children Available Help at Discharge: Family;Available 24 hours/day Type of Home: House Home Access: Ramped entrance     Home Layout: Two level;Able to live on main level with bedroom/bathroom Home Equipment: Gilford Rile - 2 wheels;Cane - single point;Grab bars - tub/shower;Shower seat      Prior Function Level of Independence: Needs  assistance   Gait / Transfers Assistance Needed: "hardly ever" uses a RW or cane  ADL's / Homemaking Assistance Needed: Children assist with IADL's - cleaning, meds, cooking        Hand Dominance   Dominant Hand: Right    Extremity/Trunk Assessment   Upper  Extremity Assessment Upper Extremity Assessment: Overall WFL for tasks assessed (age appropriate strength with decreased shoulder AROM)    Lower Extremity Assessment Lower Extremity Assessment: Overall WFL for tasks assessed (strength 5/5 bilaterally)    Cervical / Trunk Assessment Cervical / Trunk Assessment: Kyphotic  Communication   Communication: HOH  Cognition Arousal/Alertness: Awake/alert Behavior During Therapy: WFL for tasks assessed/performed Overall Cognitive Status: Impaired/Different from baseline                                        General Comments      Exercises     Assessment/Plan    PT Assessment Patent does not need any further PT services  PT Problem List         PT Treatment Interventions      PT Goals (Current goals can be found in the Care Plan section)  Acute Rehab PT Goals PT Goal Formulation: All assessment and education complete, DC therapy    Frequency     Barriers to discharge        Co-evaluation               AM-PAC PT "6 Clicks" Daily Activity  Outcome Measure Difficulty turning over in bed (including adjusting bedclothes, sheets and blankets)?: None Difficulty moving from lying on back to sitting on the side of the bed? : None Difficulty sitting down on and standing up from a chair with arms (e.g., wheelchair, bedside commode, etc,.)?: None Help needed moving to and from a bed to chair (including a wheelchair)?: None Help needed walking in hospital room?: None Help needed climbing 3-5 steps with a railing? : A Little 6 Click Score: 23    End of Session Equipment Utilized During Treatment: Gait belt Activity Tolerance: Patient tolerated treatment well Patient left: in bed;with family/visitor present;with call bell/phone within reach Nurse Communication: Mobility status PT Visit Diagnosis: Unsteadiness on feet (R26.81);Difficulty in walking, not elsewhere classified (R26.2)    Time: 7619-5093 PT  Time Calculation (min) (ACUTE ONLY): 19 min   Charges:   PT Evaluation $PT Eval Moderate Complexity: 1 Mod     PT G Codes:        Evan [name], PT, DPT Acute Rehabilitation Services Pager: [phone]   Evan [name] 12/23/2016, 2:51 PM

## 2016-12-23 NOTE — Care Management Note (Signed)
Case Management Note  Patient Details  Name: Evan [name] MRN: [id number] Date of Birth: 1927/10/15  Subjective/Objective:  Pt presented for Chest Pain. PTA from home with wife and plans to return home with [facility] PT Services.                   Action/Plan: Agency List provided and family chose [facility]. Referral made to Liaison Butch Penny and [facility] to begin within 24-48 hours post d/c. No further needs from CM at this time.   Expected Discharge Date:  12/23/16               Expected Discharge Plan:  [place]  In-House Referral:  NA  Discharge planning Services  CM Consult  Post Acute Care Choice:  Home Health Choice offered to:  Patient, Spouse  DME Arranged:  N/A DME Agency:  NA  HH Arranged:  PT HH Agency:  [place]  Status of Service:  Completed, signed off  If discussed at [place] of Stay Meetings, dates discussed:    Additional Comments:  Bethena Roys, RN 12/23/2016, 3:23 PM

## 2016-12-23 NOTE — Progress Notes (Signed)
Name: Evan [name] MRN: [id number] DOB: 1927/10/25    ADMISSION DATE:  12/20/2016 CONSULTATION DATE:  12/22/2016  REFERRING MD :  Dr. Debara Pickett  CHIEF COMPLAINT:  Chest Pain  BRIEF PATIENT DESCRIPTION: Elderly male  Sitting on side of bed, in NAD on RA.  SIGNIFICANT EVENTS  Admission 12/20/2016  STUDIES:   CXR (12/20/16): IMPRESSION: No acute cardiopulmonary findings. Chronic bronchitic type interstitial lung changes and scarring. Chest CT w/o Contrast (12/22/16): IMPRESSION: 1. Small right pleural effusion with slight increased consolidation in the right lower lobe which may reflect a pneumonia. There is a more rounded masslike area of density in the posteromedial right lung base at the site of the patient's prior lung mass, cannot exclude a recurrent mass in the region and attention to this region on follow-up is recommended. 2. Slight increased subpleural linear interstitial densities may reflect mild edema or early changes of fibrosis. TTE (12/21/16):  - Left ventricle: The cavity size was normal. There was moderate concentric hypertrophy. Systolic function was normal. The estimated ejection fraction was in the range of 60% to 65%. Wall motion was normal; there were no regional wall motion abnormalities. - Aortic valve: Severely calcified annulus. Trileaflet; mildly thickened, mildly calcified leaflets. There was mild stenosis. Valve area (VTI): 1.38 cm^2. Valve area (Vmax): 1.08 cm^2. Valve area (Vmean): 1.43 cm^2. - Aorta: Aortic root dimension: 40 mm (ED). - Aortic root: The aortic root was mildly dilated. - Mitral valve: Calcified annulus. There was mild regurgitation. - Left atrium: The atrium was severely dilated. - Right atrium: The atrium was moderately dilated. - Tricuspid valve: There was mild regurgitation. - Pulmonary arteries: PA peak pressure: 32 mm Hg (S). - Pericardium, extracardiac: A trivial, free-flowing pericardial effusion was identified posterior to the  heart. The fluid had no internal echoes.   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 After workup cardiology feels the CP may be pulmonary in origin; therefore Pulmonary is now being consulted. Regarding patient's lung cancer, when he was first diagnosed, he had CT Surgery consult who felt patient was not a good candidate for lobectomy given his advanced age, limited mobility, and chronic dyspnea. It was also questioned at that time whether this was indeed stage I or stage III, as he had positive mediastinal and hilar lymph nodes on PET scan but does not appear to have gotten an EBUS TBNA of those nodes. On my exam today he says he has had cough since December which was initially diagnosed as pneumonia and treated. The cough improved but then recurred in June. Initially it was nonproductive but is now productive of "Meister" sputum. He also c/o 5/10 "tight" left-sided CP just under his left breast that has ben intermittent since June 2018. It occurs typically "a couple" times per week, lasts 1-2 hours each time, is not exertional, does not resolve with rest, is not pleuritic, and is not related to eating or fasting. He cannot remember anything he does that makes it improve. He is not sure if it is reproducible to palpation. He does not have the CP at the time of my exam. He admits to "a little" SOB on occasion. His chart lists asthma but he adamantly denies this and says he is not  on any inhalers at home.    SUBJECTIVE:  States he is doing well. Some chest tightness, but has not asked for neb treatment  VITAL SIGNS: Temp:  [97.5 F (36.4 C)-97.8 F (36.6 C)] 97.5 F (36.4 C) (09/21 0556) Pulse Rate:  [53-59] 54 (09/21  0556) Resp:  [18-22] 22 (09/20 1605) BP: (101-156)/(56-63) 156/63 (09/21 0556) SpO2:  [95 %-99 %] 97 % (09/21 0556) Weight:  [172 lb 1.6 oz (78.1 kg)] 172 lb 1.6 oz (78.1 kg) (09/21 0556)  PHYSICAL EXAMINATION: General: Well developed well nourished elderly male   Neuro:  Cranial nerves intact, MAE x 4,   HEENT:Normocephalic, atraumatic  Cardiovascular:  RRR, S1, S2No MRG Lungs:  Clear throughout,no accessory muscle use,speaking in full sentences on RA at present Abdomen:  Soft , flat and non-distended, BS+ Musculoskeletal: no LE edema , no obvious deformities Skin:  Warm, dry and intact, no rash    Recent Labs Lab 12/21/16 0211 12/21/16 1656 12/22/16 0458 12/23/16 0404  NA 137  --  137 136  K 3.8  --  3.9 4.4  CL 109  --  107 107  CO2 20*  --  23 22  BUN 19  --  19 21*  CREATININE 1.18 1.18 1.16 1.39*  GLUCOSE 98  --  92 108*    Recent Labs Lab 12/21/16 1656 12/22/16 0458 12/23/16 0404  HGB 13.2 12.1* 11.6*  HCT 39.2 37.2* 35.8*  WBC 4.9 5.2 5.2  PLT 95* 88* 93*   Ct Chest Wo Contrast  Result Date: 12/22/2016 CLINICAL DATA:  Short of breath history of myocardial infarct EXAM: CT CHEST WITHOUT CONTRAST TECHNIQUE: Multidetector CT imaging of the chest was performed following the standard protocol without IV contrast. COMPARISON:  Radiograph 12/20/2016, CT 10/04/2016, 05/21/2016 FINDINGS: Cardiovascular: Limited evaluation without intravenous contrast. Moderate atherosclerotic calcifications of the aorta. Maximum AP diameter of the ascending aorta of 3.9 cm. Coronary artery calcifications. Borderline cardiomegaly. No large pericardial effusion. Post CABG changes. Mediastinum/Nodes: Limited evaluation for hilar adenopathy. Stable scattered subcentimeter lymph nodes in the mediastinum. Midline trachea. No thyroid mass. Esophagus within normal limits. Lungs/Pleura: Mild bilateral subpleural linear densities, may reflect mild changes of fibrosis. No pneumothorax. Small  right-sided pleural effusion. Slight increased consolidation in the right lower lobe since the prior CT. Rounded masslike appearance in the posterior, medial right lung base in the location of prior tumor. This measures approximate 3.5 cm transverse. Upper Abdomen: Surgical clips in the right upper quadrant. Calcification at the right dome of the liver unchanged. No acute abnormality. Musculoskeletal: Sternotomy changes. Degenerative changes of the spine. No acute or suspicious lesion IMPRESSION: 1. Small right pleural effusion with slight increased consolidation in the right lower lobe which may reflect a pneumonia. There is a more rounded masslike area of density in the posteromedial right lung base at the site of the patient's prior lung mass, cannot exclude a recurrent mass in the region and attention to this region on follow-up is recommended. 2. Slight increased subpleural linear interstitial densities may reflect mild edema or early changes of fibrosis. Aortic Atherosclerosis (ICD10-I70.0). Electronically Signed   By: Donavan Foil M.D.   On: 12/22/2016 00:11    ASSESSMENT / PLAN: Atypical Chest Pain with diagnosis of Adenocarcinoma post radiation therapy Per cards work up  not cardiac in nature ? History of asthma ( Not confirmed by PFT's) Pulmonary Fibrosis with slight worsening since radiation therapy( Last tx 03/2016) Mild Bronchiectasis/ Cough No new areas of infiltrate per CXR Unlikely PE  as no hypoxia, tachycardia, or increased WOB. PASP of only 61mmHg on this admission's TTE with normal RV size and function,  recent CTA on 05/21/16 which showed no PE. Former smoker Quit 07/2016  Plan: Sputum Culture>> Follow Micro Continue Duonebs Q 4 prn chest tightness Consider Flutter Valve Mobilize CXR prn Titrate oxygen for saturations > 93% Consider  stress test  Consider palliation for pain management  Magdalen Spatz, AGACNP-BC Pulmonary and [place] Pager: [phone]  12/23/2016, 68:74 AM  83 year old male with history of adeno of the RLL lung s/p radiation who presents to PCCM for chest pain that cardiology does not feel is cardiac in nature.  Patient is chest pain free at this point on exam.  I reviewed chest CT myself, mass noted but no evidence of fibrosing mediastinitis or radiation burns to explain chest pain.  I do not believe that this is the cause of the chest pain but lack of mucociliary clearance (large amount of phlegm on the CT seen on the right side) can certainly explain chronic cough.  Unfortunately, little can be done about that.  Discussed with PCCM-NP.  CP: likely musculoskeletal.  - Analgesic  - Recommend a palliative approach  Cough:   - Flutter valve  - Mucinex if continues to be an issue  - F/U as outpatient with Dr. Ashok Cordia  COPD:  - BD PRN  PCCM will sign off, please call back if needed.  Patient seen and examined, agree with above note.  I dictated the care and orders written for this patient under my direction.  Rush Farmer, M.D. [facility] Pulmonary/Critical Care Medicine. Pager: [phone]. After hours pager: [phone].

## 2016-12-23 NOTE — Progress Notes (Signed)
Patient and family received discharge information and acknowledged understanding of it. RN answered all questions. On call provider Jettie Booze, called in nitroglycerin prescription into patient's pharmacy. Patient IV was removed.

## 2016-12-23 NOTE — Progress Notes (Signed)
DAILY PROGRESS NOTE   Patient Name: Evan [name] Date of Encounter: 12/23/2016  Hospital Problem List   Principal Problem:   Chest pain at rest Active Problems:   Hyperlipidemia   Hypertension   S/P CABG (coronary artery bypass graft)   Adenocarcinoma of right lung [facility])   Atrial fibrillation ([place])   Unstable angina [facility])    Chief Complaint   I feel much better today  Subjective   [name] feels better today - Not clear what is helping - the TID aspirin, colchicine, nebulizer, time .Marland Kitchen Labs indicate he may be a little dry today - urine is dark yellow and creatinine is up some. Appreciate pulmonary recs - they do not suspect worsening lung CA or overlying pneumonia. There has been an increase in sputum, but he has not made a sample yet.  On oxygen here- but not at home. Noted to be in sinus rhythm today. Could this by why he feels better?  Objective   Vitals:   12/22/16 1601 12/22/16 1605 12/22/16 2155 12/23/16 0556  BP: 126/62 126/62 (!) 103/56 (!) 156/63  Pulse: (!) 53 (!) 54 (!) 53 (!) 54  Resp:  (!) 22    Temp: 97.7 F (36.5 C)  97.8 F (36.6 C) (!) 97.5 F (36.4 C)  TempSrc: Oral  Oral Oral  SpO2: 95% 98% 99% 97%  Weight:    172 lb 1.6 oz (78.1 kg)  Height:        Intake/Output Summary (Last 24 hours) at 12/23/16 4128 Last data filed at 12/22/16 1600  Gross per 24 hour  Intake              420 ml  Output              200 ml  Net              220 ml   Filed Weights   12/21/16 0500 12/22/16 0605 12/23/16 0556  Weight: 168 lb 11.2 oz (76.5 kg) 172 lb 1.6 oz (78.1 kg) 172 lb 1.6 oz (78.1 kg)    Physical Exam   General appearance: alert and no distress Neck: no carotid bruit, no JVD and thyroid not enlarged, symmetric, no tenderness/mass/nodules Lungs: clear to auscultation bilaterally Heart: regular rate and rhythm, S1, S2 normal, no murmur, click, rub or gallop Abdomen: soft, non-tender; bowel sounds normal; no masses,  no organomegaly Extremities:  extremities normal, atraumatic, no cyanosis or edema Pulses: 2+ and symmetric Skin: Skin color, texture, turgor normal. No rashes or lesions Neurologic: Grossly normal PSych: Pleasant  Inpatient Medications    Scheduled Meds: . aspirin  650 mg Oral TID  . atenolol  25 mg Oral Daily  . cholecalciferol  2,000 Units Oral Daily  . colchicine  0.6 mg Oral BID  . enoxaparin (LOVENOX) injection  40 mg Subcutaneous Q24H  . FLUoxetine  20 mg Oral Daily  . Influenza vac split quadrivalent PF  0.5 mL Intramuscular Tomorrow-1000  . leflunomide  10 mg Oral Daily  . LORazepam  0.5 mg Oral Q lunch  . LORazepam  1 mg Oral BID  . pantoprazole  40 mg Oral Daily  . rosuvastatin  5 mg Oral Daily  . tamsulosin  0.4 mg Oral Daily  . vitamin B-12  500 mcg Oral Daily    Continuous Infusions: . sodium chloride 10 mL/hr at 12/20/16 2128    PRN Meds: acetaminophen, ipratropium-albuterol, morphine injection, ondansetron (ZOFRAN) IV, polyvinyl alcohol   Labs  Results for orders placed or performed during the hospital encounter of 12/20/16 (from the past 48 hour(s))  Heparin level (unfractionated)     Status: None   Collection Time: 12/21/16 11:16 AM  Result Value Ref Range   Heparin Unfractionated 0.33 0.30 - 0.70 IU/mL    Comment:        IF HEPARIN RESULTS ARE BELOW EXPECTED VALUES, AND PATIENT DOSAGE HAS BEEN CONFIRMED, SUGGEST FOLLOW UP TESTING OF ANTITHROMBIN III LEVELS.   CBC     Status: Abnormal   Collection Time: 12/21/16  4:56 PM  Result Value Ref Range   WBC 4.9 4.0 - 10.5 K/uL   RBC 4.20 (L) 4.22 - 5.81 MIL/uL   Hemoglobin 13.2 13.0 - 17.0 g/dL   HCT 39.2 39.0 - 52.0 %   MCV 93.3 78.0 - 100.0 fL   MCH 31.4 26.0 - 34.0 pg   MCHC 33.7 30.0 - 36.0 g/dL   RDW 14.3 11.5 - 15.5 %   Platelets 95 (L) 150 - 400 K/uL    Comment: SPECIMEN CHECKED FOR CLOTS REPEATED TO VERIFY CONSISTENT WITH PREVIOUS RESULT   Creatinine, serum     Status: Abnormal   Collection Time: 12/21/16  4:56 PM   Result Value Ref Range   Creatinine, Ser 1.18 0.61 - 1.24 mg/dL   GFR calc non Af Amer 54 (L) >60 mL/min   GFR calc Af Amer >60 >60 mL/min    Comment: (NOTE) The eGFR has been calculated using the CKD EPI equation. This calculation has not been validated in all clinical situations. eGFR's persistently <60 mL/min signify possible Chronic Kidney Disease.   CBC     Status: Abnormal   Collection Time: 12/22/16  4:58 AM  Result Value Ref Range   WBC 5.2 4.0 - 10.5 K/uL   RBC 4.00 (L) 4.22 - 5.81 MIL/uL   Hemoglobin 12.1 (L) 13.0 - 17.0 g/dL   HCT 37.2 (L) 39.0 - 52.0 %   MCV 93.0 78.0 - 100.0 fL   MCH 30.3 26.0 - 34.0 pg   MCHC 32.5 30.0 - 36.0 g/dL   RDW 13.8 11.5 - 15.5 %   Platelets 88 (L) 150 - 400 K/uL    Comment: CONSISTENT WITH PREVIOUS RESULT  Basic metabolic panel     Status: Abnormal   Collection Time: 12/22/16  4:58 AM  Result Value Ref Range   Sodium 137 135 - 145 mmol/L   Potassium 3.9 3.5 - 5.1 mmol/L   Chloride 107 101 - 111 mmol/L   CO2 23 22 - 32 mmol/L   Glucose, Bld 92 65 - 99 mg/dL   BUN 19 6 - 20 mg/dL   Creatinine, Ser 1.16 0.61 - 1.24 mg/dL   Calcium 8.4 (L) 8.9 - 10.3 mg/dL   GFR calc non Af Amer 55 (L) >60 mL/min   GFR calc Af Amer >60 >60 mL/min    Comment: (NOTE) The eGFR has been calculated using the CKD EPI equation. This calculation has not been validated in all clinical situations. eGFR's persistently <60 mL/min signify possible Chronic Kidney Disease.    Anion gap 7 5 - 15  CBC     Status: Abnormal   Collection Time: 12/23/16  4:04 AM  Result Value Ref Range   WBC 5.2 4.0 - 10.5 K/uL   RBC 3.85 (L) 4.22 - 5.81 MIL/uL   Hemoglobin 11.6 (L) 13.0 - 17.0 g/dL   HCT 35.8 (L) 39.0 - 52.0 %   MCV 93.0 78.0 - 100.0  fL   MCH 30.1 26.0 - 34.0 pg   MCHC 32.4 30.0 - 36.0 g/dL   RDW 13.8 11.5 - 15.5 %   Platelets 93 (L) 150 - 400 K/uL    Comment: CONSISTENT WITH PREVIOUS RESULT  Basic metabolic panel     Status: Abnormal   Collection Time:  12/23/16  4:04 AM  Result Value Ref Range   Sodium 136 135 - 145 mmol/L   Potassium 4.4 3.5 - 5.1 mmol/L   Chloride 107 101 - 111 mmol/L   CO2 22 22 - 32 mmol/L   Glucose, Bld 108 (H) 65 - 99 mg/dL   BUN 21 (H) 6 - 20 mg/dL   Creatinine, Ser 1.39 (H) 0.61 - 1.24 mg/dL   Calcium 8.4 (L) 8.9 - 10.3 mg/dL   GFR calc non Af Amer 44 (L) >60 mL/min   GFR calc Af Amer 51 (L) >60 mL/min    Comment: (NOTE) The eGFR has been calculated using the CKD EPI equation. This calculation has not been validated in all clinical situations. eGFR's persistently <60 mL/min signify possible Chronic Kidney Disease.    Anion gap 7 5 - 15    ECG   N/A  Telemetry   NSR - Personally Reviewed  Radiology    Ct Chest Wo Contrast  Result Date: 12/22/2016 CLINICAL DATA:  Short of breath history of myocardial infarct EXAM: CT CHEST WITHOUT CONTRAST TECHNIQUE: Multidetector CT imaging of the chest was performed following the standard protocol without IV contrast. COMPARISON:  Radiograph 12/20/2016, CT 10/04/2016, 05/21/2016 FINDINGS: Cardiovascular: Limited evaluation without intravenous contrast. Moderate atherosclerotic calcifications of the aorta. Maximum AP diameter of the ascending aorta of 3.9 cm. Coronary artery calcifications. Borderline cardiomegaly. No large pericardial effusion. Post CABG changes. Mediastinum/Nodes: Limited evaluation for hilar adenopathy. Stable scattered subcentimeter lymph nodes in the mediastinum. Midline trachea. No thyroid mass. Esophagus within normal limits. Lungs/Pleura: Mild bilateral subpleural linear densities, may reflect mild changes of fibrosis. No pneumothorax. Small right-sided pleural effusion. Slight increased consolidation in the right lower lobe since the prior CT. Rounded masslike appearance in the posterior, medial right lung base in the location of prior tumor. This measures approximate 3.5 cm transverse. Upper Abdomen: Surgical clips in the right upper quadrant.  Calcification at the right dome of the liver unchanged. No acute abnormality. Musculoskeletal: Sternotomy changes. Degenerative changes of the spine. No acute or suspicious lesion IMPRESSION: 1. Small right pleural effusion with slight increased consolidation in the right lower lobe which may reflect a pneumonia. There is a more rounded masslike area of density in the posteromedial right lung base at the site of the patient's prior lung mass, cannot exclude a recurrent mass in the region and attention to this region on follow-up is recommended. 2. Slight increased subpleural linear interstitial densities may reflect mild edema or early changes of fibrosis. Aortic Atherosclerosis (ICD10-I70.0). Electronically Signed   By: Donavan Foil M.D.   On: 12/22/2016 00:11    Cardiac Studies   N/A  Assessment   1. Principal Problem: 2.   Chest pain at rest 3. Active Problems: 4.   Hyperlipidemia 5.   Hypertension 6.   S/P CABG (coronary artery bypass graft) 7.   Adenocarcinoma of right lung ([place]) 8.   Atrial fibrillation ([place]) 9.   Unstable angina ([place]) 10.   Plan   1. Feels much better today - not sure if this being in sinus, related to ASA or colchicine, nebs .Marland Kitchen Check ambulatory O2 sats today off oxygen.  Will continue aspirin TID for 2 weeks stop and then colchicine for 3 months. CHADSVASC score of 4 - second episode of PAF this admission. WoLikely okay for d/c later today +/- oxygen based on sats.  Time Spent Directly with Patient:  I have spent a total of 15 minutes with the patient reviewing hospital notes, telemetry, EKGs, labs and examining the patient as well as establishing an assessment and plan that was discussed personally with the patient. > 50% of time was spent in direct patient care.  Length of Stay:  LOS: 3 days   Pixie Casino, MD, [place]  Attending Cardiologist  Direct Dial: [phone]  Fax: [phone]  Website:   [email or web address] 12/23/2016, 9:23 AM

## 2016-12-23 NOTE — Progress Notes (Signed)
Patient ambulated with RN in hall about 50 ft on RA and maintained O2 sats from 91-96%.

## 2016-12-23 NOTE — Care Management Important Message (Signed)
Important Message  Patient Details  Name: Evan [name] MRN: [id number] Date of Birth: 1927/05/29   Medicare Important Message Given:  Yes    Nathen May 12/23/2016, 10:38 AM

## 2016-12-23 NOTE — Discharge Summary (Signed)
Patient ID: Evan [name],  MRN: [id number], DOB/AGE: 1927/04/27 83 y.o.  Admit date: 12/20/2016 Discharge date: 12/23/2016  Primary Care Provider: [name], MD Primary Cardiologist: Dr Irish Lack  Discharge Diagnoses Principal Problem:   Chest pain at rest Active Problems:   Hyperlipidemia   Hypertension   S/P CABG (coronary artery bypass graft)   Adenocarcinoma of right lung [facility])   Atrial fibrillation [facility])   Unstable angina [facility])   Acute pericarditis    Procedures: Echo 12/21/17                        Chest CT 12/21/16   Hospital Course:  This is a pleasant 83 year old male patient of [name] with a history of coronary artery disease and two-vessel CABG in 93 and cath in 2009 which showed patent stents and grafts. Unfortunately had lung cancer and received radiation therapy last year. His last stress test was more than a year ago. He now presents with chest pain. He says he is actually had slightly more chest pain since he's been here in the ER. Blood pressure is elevated at 841 systolic. He is found to be in atrial fibrillation but with slow ventricular response and heart rate of 50. I agree with Ms. Ingold's physical exam findings. Initial troponin is negative at 0.02.   The pt had continued pain but Troponin's were negative and we felt this was probably not ischemic. Chest CT was done and pt was seen by the pulmonary service. Echo showed stable LVEF and mild AS. The pt was started on hig dose ASA and colchicine for presumed pericarditis and had improvement in his symptoms. He also converted spontaneously to NSR before discharge. His CHADSVASC score is 4. After discussion with Dr Debara Pickett we felt it would be best to hold off on anticoagulation till he completes two weeks of high dose ASA and he is seen by Dr Irish Lack as an OP.  Pulmonary did not suspect worsening lung CA or overlying pneumonia.     Discharge Vitals:  Blood pressure 138/69, pulse (!) 55, temperature  98.1 F (36.7 C), temperature source Oral, resp. rate (!) 22, height 5\' 8"  (1.727 m), weight 172 lb 1.6 oz (78.1 kg), SpO2 99 %.    Labs: Results for orders placed or performed during the hospital encounter of 12/20/16 (from the past 24 hour(s))  CBC     Status: Abnormal   Collection Time: 12/23/16  4:04 AM  Result Value Ref Range   WBC 5.2 4.0 - 10.5 K/uL   RBC 3.85 (L) 4.22 - 5.81 MIL/uL   Hemoglobin 11.6 (L) 13.0 - 17.0 g/dL   HCT 35.8 (L) 39.0 - 52.0 %   MCV 93.0 78.0 - 100.0 fL   MCH 30.1 26.0 - 34.0 pg   MCHC 32.4 30.0 - 36.0 g/dL   RDW 13.8 11.5 - 15.5 %   Platelets 93 (L) 150 - 400 K/uL  Basic metabolic panel     Status: Abnormal   Collection Time: 12/23/16  4:04 AM  Result Value Ref Range   Sodium 136 135 - 145 mmol/L   Potassium 4.4 3.5 - 5.1 mmol/L   Chloride 107 101 - 111 mmol/L   CO2 22 22 - 32 mmol/L   Glucose, Bld 108 (H) 65 - 99 mg/dL   BUN 21 (H) 6 - 20 mg/dL   Creatinine, Ser 1.39 (H) 0.61 - 1.24 mg/dL   Calcium 8.4 (L) 8.9 - 10.3  mg/dL   GFR calc non Af Amer 44 (L) >60 mL/min   GFR calc Af Amer 51 (L) >60 mL/min   Anion gap 7 5 - 15    Disposition:  Follow-up Information    Jettie Booze, MD Follow up.   Specialties:  Cardiology, Radiology, Interventional Cardiology Why:  office will conatct you to see Dr Hassell Done APP Contact information: [address] [place] [address] [phone]           Discharge Medications:  Allergies as of 12/23/2016      Reactions   Feldene [piroxicam] Other (See Comments)   REACTION: Blisters   Statins Other (See Comments)   Myalgias   Doxycycline Other (See Comments)   REACTION:  unknown   Latex Rash   Tequin Anxiety   Valium Other (See Comments)   REACTION:  unknown      Medication List    STOP taking these medications   amoxicillin 500 MG capsule Commonly known as:  AMOXIL   clopidogrel 75 MG tablet Commonly known as:  PLAVIX     TAKE these medications     aerochamber plus with mask inhaler Use as instructed   albuterol 108 (90 Base) MCG/ACT inhaler Commonly known as:  VENTOLIN HFA Inhale 1-2 puffs into the lungs every 6 (six) hours as needed for wheezing or shortness of breath.   aspirin 325 MG tablet Take 2 tablets (650 mg total) by mouth 3 (three) times daily.   atenolol 50 MG tablet Commonly known as:  TENORMIN Take 0.5 tablets (25 mg total) by mouth daily. Please call and schedule follow up. [phone] What changed:  how much to take   Cholecalciferol 2000 units Tabs Take 1 tablet (2,000 Units total) by mouth daily.   colchicine 0.6 MG tablet Take 1 tablet (0.6 mg total) by mouth 2 (two) times daily.   FLUoxetine 20 MG capsule Commonly known as:  PROZAC TAKE 20 mg  BY MOUTH DAILY   ipratropium-albuterol 0.5-2.5 (3) MG/3ML Soln Commonly known as:  DUONEB Take 3 mLs by nebulization every 4 (four) hours as needed (or Chest Pain / Tightness).   leflunomide 10 MG tablet Commonly known as:  ARAVA Take 10 mg by mouth daily.   LORazepam 1 MG tablet Commonly known as:  ATIVAN Take 1mg  by mouth every morning; Take 0.5 mg at lunch; Take 1mg  at bedtime   nitroGLYCERIN 0.4 MG SL tablet Commonly known as:  NITROSTAT Place 1 tablet (0.4 mg total) under the tongue every 5 (five) minutes as needed for chest pain.   pantoprazole 40 MG tablet Commonly known as:  PROTONIX Take 1 tablet (40 mg total) by mouth daily.   rosuvastatin 5 MG tablet Commonly known as:  CRESTOR TAKE 5 mg  EVERY DAY(NEED TO SCHEDULE YEARLY APPOINTMENT FOR AUGUST )   tamsulosin 0.4 MG Caps capsule Commonly known as:  FLOMAX TAKE 0.4 gm by mouth EVERY DAY   VITAMIN B-12 PO Take 1 tablet by mouth daily.   XIIDRA 5 % Soln Generic drug:  Lifitegrast Place 1-2 drops into both eyes daily.            Discharge Care Instructions        Start     Ordered   12/23/16 0000  aspirin 325 MG tablet  3 times daily    Question:  Supervising Provider   Answer:  Lorretta Harp   12/23/16 1113   12/23/16 0000  atenolol (TENORMIN) 50 MG tablet  Daily    Comments:  Patient needs follow up scheduled to continue to receive medication  Question:  Supervising Provider  Answer:  Lorretta Harp   12/23/16 1113   12/23/16 0000  colchicine 0.6 MG tablet  2 times daily    Question:  Supervising Provider  Answer:  Lorretta Harp   12/23/16 1113   12/23/16 0000  FLUoxetine (PROZAC) 20 MG capsule    Question:  Supervising Provider  Answer:  Lorretta Harp   12/23/16 1113   12/23/16 0000  ipratropium-albuterol (DUONEB) 0.5-2.5 (3) MG/3ML SOLN  Every 4 hours PRN    Question:  Supervising Provider  Answer:  Lorretta Harp   12/23/16 1113   12/23/16 0000  LORazepam (ATIVAN) 1 MG tablet    Question:  Supervising Provider  Answer:  Lorretta Harp   12/23/16 1113   12/23/16 0000  rosuvastatin (CRESTOR) 5 MG tablet    Question:  Supervising Provider  Answer:  Lorretta Harp   12/23/16 1113   12/23/16 0000  tamsulosin (FLOMAX) 0.4 MG CAPS capsule    Question:  Supervising Provider  Answer:  Lorretta Harp   12/23/16 1113       Duration of Discharge Encounter: Greater than 30 minutes including physician time.  Angelena Form PA-C 12/23/2016 1:04 PM

## 2016-12-23 NOTE — Care Management Important Message (Signed)
Important Message  Patient Details  Name: Evan [name] MRN: [id number] Date of Birth: Jan 11, 1928   Medicare Important Message Given:  Yes    Aadil Sur [address] 12/23/2016, 12:19 PM

## 2016-12-26 ENCOUNTER — Encounter: Payer: Self-pay | Admitting: Family Medicine

## 2016-12-26 ENCOUNTER — Other Ambulatory Visit: Payer: Medicare HMO

## 2016-12-26 ENCOUNTER — Ambulatory Visit (INDEPENDENT_AMBULATORY_CARE_PROVIDER_SITE_OTHER): Payer: Medicare HMO | Admitting: Family Medicine

## 2016-12-26 ENCOUNTER — Telehealth: Payer: Self-pay | Admitting: Family Medicine

## 2016-12-26 VITALS — BP 118/60 | HR 63 | Temp 98.6°F | Resp 18 | Ht 68.0 in | Wt 174.0 lb

## 2016-12-26 DIAGNOSIS — I4891 Unspecified atrial fibrillation: Secondary | ICD-10-CM | POA: Diagnosis not present

## 2016-12-26 DIAGNOSIS — R197 Diarrhea, unspecified: Secondary | ICD-10-CM | POA: Diagnosis not present

## 2016-12-26 DIAGNOSIS — I25110 Atherosclerotic heart disease of native coronary artery with unstable angina pectoris: Secondary | ICD-10-CM | POA: Diagnosis not present

## 2016-12-26 DIAGNOSIS — G8929 Other chronic pain: Secondary | ICD-10-CM | POA: Diagnosis not present

## 2016-12-26 DIAGNOSIS — M6281 Muscle weakness (generalized): Secondary | ICD-10-CM | POA: Diagnosis not present

## 2016-12-26 DIAGNOSIS — J8410 Pulmonary fibrosis, unspecified: Secondary | ICD-10-CM | POA: Diagnosis not present

## 2016-12-26 DIAGNOSIS — I252 Old myocardial infarction: Secondary | ICD-10-CM | POA: Diagnosis not present

## 2016-12-26 DIAGNOSIS — Z09 Encounter for follow-up examination after completed treatment for conditions other than malignant neoplasm: Secondary | ICD-10-CM

## 2016-12-26 DIAGNOSIS — M069 Rheumatoid arthritis, unspecified: Secondary | ICD-10-CM | POA: Diagnosis not present

## 2016-12-26 DIAGNOSIS — M545 Low back pain: Secondary | ICD-10-CM | POA: Diagnosis not present

## 2016-12-26 DIAGNOSIS — I10 Essential (primary) hypertension: Secondary | ICD-10-CM | POA: Diagnosis not present

## 2016-12-26 LAB — COMPLETE METABOLIC PANEL WITH GFR
AG Ratio: 1.7 (calc) (ref 1.0–2.5)
ALT: 15 U/L (ref 9–46)
AST: 23 U/L (ref 10–35)
Albumin: 3.7 g/dL (ref 3.6–5.1)
Alkaline phosphatase (APISO): 108 U/L (ref 40–115)
BUN/Creatinine Ratio: 17 (calc) (ref 6–22)
BUN: 20 mg/dL (ref 7–25)
CO2: 18 mmol/L — ABNORMAL LOW (ref 20–32)
Calcium: 8.3 mg/dL — ABNORMAL LOW (ref 8.6–10.3)
Chloride: 112 mmol/L — ABNORMAL HIGH (ref 98–110)
Creat: 1.16 mg/dL — ABNORMAL HIGH (ref 0.70–1.11)
GFR, Est African American: 65 mL/min/{1.73_m2} (ref >=60)
GFR, Est Non African American: 56 mL/min/{1.73_m2} — ABNORMAL LOW (ref >=60)
Globulin: 2.2 g/dL (calc) (ref 1.9–3.7)
Glucose, Bld: 95 mg/dL (ref 65–99)
Potassium: 4.7 mmol/L (ref 3.5–5.3)
Sodium: 139 mmol/L (ref 135–146)
Total Bilirubin: 0.4 mg/dL (ref 0.2–1.2)
Total Protein: 5.9 g/dL — ABNORMAL LOW (ref 6.1–8.1)

## 2016-12-26 LAB — CBC WITH DIFFERENTIAL/PLATELET
Basophils Absolute: 19 cells/uL (ref 0–200)
Basophils Relative: 0.4 %
Eosinophils Absolute: 72 cells/uL (ref 15–500)
Eosinophils Relative: 1.5 %
HCT: 37 % — ABNORMAL LOW (ref 38.5–50.0)
Hemoglobin: 12.3 g/dL — ABNORMAL LOW (ref 13.2–17.1)
Lymphs Abs: 974 cells/uL (ref 850–3900)
MCH: 30.6 pg (ref 27.0–33.0)
MCHC: 33.2 g/dL (ref 32.0–36.0)
MCV: 92 fL (ref 80.0–100.0)
MPV: 12.2 fL (ref 7.5–12.5)
Monocytes Relative: 18.9 %
Neutro Abs: 2827 cells/uL (ref 1500–7800)
Neutrophils Relative %: 58.9 %
Platelets: 98 10*3/uL — ABNORMAL LOW (ref 140–400)
RBC: 4.02 10*6/uL — ABNORMAL LOW (ref 4.20–5.80)
RDW: 12.6 % (ref 11.0–15.0)
Total Lymphocyte: 20.3 %
WBC mixed population: 907 cells/uL (ref 200–950)
WBC: 4.8 10*3/uL (ref 3.8–10.8)

## 2016-12-26 LAB — CULTURE, RESPIRATORY W GRAM STAIN: Culture: NORMAL

## 2016-12-26 LAB — CULTURE, RESPIRATORY

## 2016-12-26 NOTE — Progress Notes (Signed)
Subjective:    Patient ID: Evan [name], male    DOB: [birth year], 83 y.o.   MRN: [id number]  HPI Was recently admitted to the hospital with chest pain. I have copied relevant portions of the discharge summary and included them below for my reference:  Admit date: 12/20/2016 Discharge date: 12/23/2016  Primary Care Provider: [name], MD Primary Cardiologist: Dr Irish Lack  Discharge Diagnoses Principal Problem:   Chest pain at rest Active Problems:   Hyperlipidemia   Hypertension   S/P CABG (coronary artery bypass graft)   Adenocarcinoma of right lung [facility])   Atrial fibrillation ([place])   Unstable angina ([place])   Acute pericarditis    Procedures: Echo 12/21/17                        Chest CT 12/21/16   Hospital Course:  This is a pleasant 83 year old male patient of [name] with a history of coronary artery disease and two-vessel CABG in 34 and cath in 2009 which showed patent stents and grafts. Unfortunately had lung cancer and received radiation therapy last year. His last stress test was more than a year ago. He now presents with chest pain. He says he is actually had slightly more chest pain since he's been here in the ER. Blood pressure is elevated at 378 systolic. He is found to be in atrial fibrillation but with slow ventricular response and heart rate of 50. I agree with Ms. Ingold's physical exam findings. Initial troponin is negative at 0.02.   The pt had continued pain but Troponin's were negative and we felt this was probably not ischemic. Chest CT was done and pt was seen by the pulmonary service. Echoshowed stable LVEF and mild AS. The pt was started on hig dose ASA and colchicine for presumed pericarditis and had improvement in his symptoms. He also converted spontaneously to NSR before discharge. His CHADSVASC score is 4. After discussion with Dr Debara Pickett we felt it would be best to hold off on anticoagulation till he completes two weeks of high dose ASA  and he is seen by Dr Irish Lack as an OP.  Pulmonary did not suspect worsening lung CA or overlying pneumonia.     Discharge Vitals:  Blood pressure 138/69, pulse (!) 55, temperature 98.1 F (36.7 C), temperature source Oral, resp. rate (!) 22, height 5' 8" (1.727 m), weight 172 lb 1.6 oz (78.1 kg), SpO2 99 %.    Labs: Lab Results Last 24 Hours       Results for orders placed or performed during the hospital encounter of 12/20/16 (from the past 24 hour(s))  CBC     Status: Abnormal   Collection Time: 12/23/16  4:04 AM  Result Value Ref Range   WBC 5.2 4.0 - 10.5 K/uL   RBC 3.85 (L) 4.22 - 5.81 MIL/uL   Hemoglobin 11.6 (L) 13.0 - 17.0 g/dL   HCT 35.8 (L) 39.0 - 52.0 %   MCV 93.0 78.0 - 100.0 fL   MCH 30.1 26.0 - 34.0 pg   MCHC 32.4 30.0 - 36.0 g/dL   RDW 13.8 11.5 - 15.5 %   Platelets 93 (L) 150 - 400 K/uL  Basic metabolic panel     Status: Abnormal   Collection Time: 12/23/16  4:04 AM  Result Value Ref Range   Sodium 136 135 - 145 mmol/L   Potassium 4.4 3.5 - 5.1 mmol/L   Chloride 107 101 - 111 mmol/L  CO2 22 22 - 32 mmol/L   Glucose, Bld 108 (H) 65 - 99 mg/dL   BUN 21 (H) 6 - 20 mg/dL   Creatinine, Ser 1.39 (H) 0.61 - 1.24 mg/dL   Calcium 8.4 (L) 8.9 - 10.3 mg/dL   GFR calc non Af Amer 44 (L) >60 mL/min   GFR calc Af Amer 51 (L) >60 mL/min   Anion gap 7 5 - 15      Discharge Medications:      Allergies as of 12/23/2016      Reactions   Feldene [piroxicam] Other (See Comments)   REACTION: Blisters   Statins Other (See Comments)   Myalgias   Doxycycline Other (See Comments)   REACTION:  unknown   Latex Rash   Tequin Anxiety   Valium Other (See Comments)   REACTION:  unknown                           Medication List               STOP taking these medications            amoxicillin 500 MG capsule Commonly known as:  AMOXIL    clopidogrel 75 MG tablet Commonly known as:  PLAVIX                         TAKE these medications            aerochamber plus with mask inhaler Use as instructed    albuterol 108 (90 Base) MCG/ACT inhaler Commonly known as:  VENTOLIN HFA Inhale 1-2 puffs into the lungs every 6 (six) hours as needed for wheezing or shortness of breath.    aspirin 325 MG tablet Take 2 tablets (650 mg total) by mouth 3 (three) times daily.    atenolol 50 MG tablet Commonly known as:  TENORMIN Take 0.5 tablets (25 mg total) by mouth daily. Please call and schedule follow up. [phone] What changed:  how much to take    Cholecalciferol 2000 units Tabs Take 1 tablet (2,000 Units total) by mouth daily.    colchicine 0.6 MG tablet Take 1 tablet (0.6 mg total) by mouth 2 (two) times daily.    FLUoxetine 20 MG capsule Commonly known as:  PROZAC TAKE 20 mg  BY MOUTH DAILY    ipratropium-albuterol 0.5-2.5 (3) MG/3ML Soln Commonly known as:  DUONEB Take 3 mLs by nebulization every 4 (four) hours as needed (or Chest Pain / Tightness).    leflunomide 10 MG tablet Commonly known as:  ARAVA Take 10 mg by mouth daily.    LORazepam 1 MG tablet Commonly known as:  ATIVAN Take 59m by mouth every morning; Take 0.5 mg at lunch; Take 158mat bedtime    nitroGLYCERIN 0.4 MG SL tablet Commonly known as:  NITROSTAT Place 1 tablet (0.4 mg total) under the tongue every 5 (five) minutes as needed for chest pain.    pantoprazole 40 MG tablet Commonly known as:  PROTONIX Take 1 tablet (40 mg total) by mouth daily.    rosuvastatin 5 MG tablet Commonly known as:  CRESTOR TAKE 5 mg  EVERY DAY(NEED TO SCHEDULE YEARLY APPOINTMENT FOR AUGUST )    tamsulosin 0.4 MG Caps capsule Commonly known as:  FLOMAX TAKE 0.4 gm by mouth EVERY DAY    VITAMIN B-12 PO Take 1 tablet by mouth daily.    XIIDRA 5 % Soln  Generic drug:  Lifitegrast Place 1-2 drops into both eyes daily.    Echo in the hospital revealed: Study Conclusions  - Left ventricle: The cavity size was  normal. There was moderate   concentric hypertrophy. Systolic function was normal. The   estimated ejection fraction was in the range of 60% to 65%. Wall   motion was normal; there were no regional wall motion   abnormalities. - Aortic valve: Severely calcified annulus. Trileaflet; mildly   thickened, mildly calcified leaflets. There was mild stenosis.   Valve area (VTI): 1.38 cm^2. Valve area (Vmax): 1.08 cm^2. Valve   area (Vmean): 1.43 cm^2. - Aorta: Aortic root dimension: 40 mm (ED). - Aortic root: The aortic root was mildly dilated. - Mitral valve: Calcified annulus. There was mild regurgitation. - Left atrium: The atrium was severely dilated. - Right atrium: The atrium was moderately dilated. - Tricuspid valve: There was mild regurgitation. - Pulmonary arteries: PA peak pressure: 32 mm Hg (S). - Pericardium, extracardiac: A trivial, free-flowing pericardial   effusion was identified posterior to the heart. The fluid had no   internal echoes.   12/26/16 Ever since starting colchicine and being discharged from the hospital, the patient has had copious diarrhea. He is taking colchicine twice a day for his pericarditis in addition to high-dose aspirin. He denies any melena. He denies any fevers or chills. He denies any hematochezia. His daughter is trying her best to keep him hydrated. At the present time they have not tried any medication specifically for diarrhea. He does have a history of C. difficile colitis however his daughter states that this smells different. She believes is due to the colchicine which is very likely. There've been no known sick contacts. He denies any nausea or vomiting. Since starting the aspirin and the colchicine, his chest pain is better. However he is becoming weak due to the diarrhea  Past Medical History:  Diagnosis Date  . Adenocarcinoma of right lung ([place]) 01/06/2016  . Anginal chest pain at rest [facility])    Chronic non, controlled on Lorazepam  . Anxiety    . Arthritis    "all over"   . BPH (benign prostatic hyperplasia)   . CAD (coronary artery disease)   . Chronic lower back pain   . Complication of anesthesia    "problems making water afterwards"  . Depression   . DJD (degenerative joint disease)   . Esophageal ulcer without bleeding    bid ppi indefinitely  . Family history of adverse reaction to anesthesia    "children w/PONV"  . GERD (gastroesophageal reflux disease)   . Headache    "couple/week maybe" (09/04/2015)  . Hemochromatosis    Possible, elevated iron stores, hook-like osteophytes on hand films, normal LFTs  . Hepatitis    "yellow jaundice as a baby"  . History of ASCVD    MULTIVESSEL  . Hyperlipidemia   . Hypertension   . Myocardial infarction ([place]) 1999  . Osteoarthritis   . Pneumonia    "several times; got a little now" (09/04/2015)  . Rheumatoid arthritis ([place])   . Subdural hematoma ([facility])    small after fall 09/2016-plavix held, neurosurgery consulted.  Asymptomatic.     Past Surgical History:  Procedure Laterality Date  . ARM SKIN LESION BIOPSY / EXCISION Left 09/02/2015  . CATARACT EXTRACTION W/ INTRAOCULAR LENS  IMPLANT, BILATERAL Bilateral   . CHOLECYSTECTOMY OPEN    . CORNEAL TRANSPLANT Bilateral    "[facility] at [facility]; one at [facility]"  .  CORONARY ANGIOPLASTY WITH STENT PLACEMENT    . CORONARY ARTERY BYPASS GRAFT  1999  . DESCENDING AORTIC ANEURYSM REPAIR W/ STENT     DE stent ostium into the right radial free graft at OM-, 02-2007  . ESOPHAGOGASTRODUODENOSCOPY N/A 11/09/2014   Procedure: ESOPHAGOGASTRODUODENOSCOPY (EGD);  Surgeon: [name], MD;  Location: [facility] ENDOSCOPY;  Service: Endoscopy;  Laterality: N/A;  . INGUINAL HERNIA REPAIR    . JOINT REPLACEMENT    . NASAL SINUS SURGERY    . SHOULDER OPEN ROTATOR CUFF REPAIR Right   . TOTAL KNEE ARTHROPLASTY Bilateral    Current Outpatient Prescriptions on File Prior to Visit  Medication Sig Dispense Refill  . albuterol (VENTOLIN HFA) 108 (90 Base) MCG/ACT  inhaler Inhale 1-2 puffs into the lungs every 6 (six) hours as needed for wheezing or shortness of breath. 18 g 2  . aspirin 325 MG tablet Take 2 tablets (650 mg total) by mouth 3 (three) times daily.    Marland Kitchen atenolol (TENORMIN) 50 MG tablet Take 0.5 tablets (25 mg total) by mouth daily. Please call and schedule follow up. [phone]    . Cholecalciferol 2000 units TABS Take 1 tablet (2,000 Units total) by mouth daily. 30 each 11  . colchicine 0.6 MG tablet Take 1 tablet (0.6 mg total) by mouth 2 (two) times daily. 200 tablet 0  . Cyanocobalamin (VITAMIN B-12 PO) Take 1 tablet by mouth daily.    Marland Kitchen FLUoxetine (PROZAC) 20 MG capsule TAKE 20 mg  BY MOUTH DAILY 90 capsule 1  . ipratropium-albuterol (DUONEB) 0.5-2.5 (3) MG/3ML SOLN Take 3 mLs by nebulization every 4 (four) hours as needed (or Chest Pain / Tightness). 360 mL 3  . leflunomide (ARAVA) 10 MG tablet Take 10 mg by mouth daily.     Marland Kitchen Lifitegrast (XIIDRA) 5 % SOLN Place 1-2 drops into both eyes daily.    Marland Kitchen LORazepam (ATIVAN) 1 MG tablet Take 1mg  by mouth every morning; Take 0.5 mg at lunch; Take 1mg  at bedtime 90 tablet 0  . nitroGLYCERIN (NITROSTAT) 0.4 MG SL tablet Place 1 tablet (0.4 mg total) under the tongue every 5 (five) minutes as needed for chest pain. 25 tablet 2  . pantoprazole (PROTONIX) 40 MG tablet Take 1 tablet (40 mg total) by mouth daily. 90 tablet 2  . rosuvastatin (CRESTOR) 5 MG tablet TAKE 5 mg  EVERY DAY(NEED TO SCHEDULE YEARLY APPOINTMENT FOR AUGUST ) 90 tablet 3  . Spacer/Aero-Holding Chambers (AEROCHAMBER PLUS WITH MASK) inhaler Use as instructed 1 each 2  . tamsulosin (FLOMAX) 0.4 MG CAPS capsule TAKE 0.4 gm by mouth EVERY DAY 90 capsule 3   No current facility-administered medications on file prior to visit.    Allergies  Allergen Reactions  . Feldene [Piroxicam] Other (See Comments)    REACTION: Blisters  . Statins Other (See Comments)    Myalgias  . Doxycycline Other (See Comments)    REACTION:  unknown  .  Latex Rash  . Tequin Anxiety  . Valium Other (See Comments)    REACTION:  unknown   Social History   Social History  . Marital status: Widowed    Spouse name: N/A  . Number of children: N/A  . Years of education: N/A   Occupational History  . Not on file.   Social History Main Topics  . Smoking status: Never Smoker  . Smokeless tobacco: Former Systems developer    Types: Chew     Comment: "quit chewing in the 1960s"  . Alcohol use  No  . Drug use: No  . Sexual activity: No   Other Topics Concern  . Not on file   Social History Narrative  . No narrative on file    Review of Systems  All other systems reviewed and are negative.      Objective:   Physical Exam  Constitutional: He appears well-developed and well-nourished. No distress.  Neck: Neck supple. No JVD present.  Cardiovascular: Normal rate, regular rhythm and normal heart sounds.   Pulmonary/Chest: Effort normal and breath sounds normal. No respiratory distress. He has no wheezes. He has no rales.  Abdominal: Soft. Bowel sounds are normal. He exhibits no distension. There is no tenderness. There is no rebound.  Musculoskeletal: He exhibits no edema.  Lymphadenopathy:    He has no cervical adenopathy.  Skin: He is not diaphoretic.  Vitals reviewed.         Assessment & Plan:  Hospital discharge follow-up  Diarrhea, unspecified type - Plan: Gastrointestinal Pathogen Panel PCR, CBC with Differential/Platelet, COMPLETE METABOLIC PANEL WITH GFR  I believe the patient's diarrhea is likely due to colchicine. However at the present time it seems to be helping his pericarditis in addition to the high-dose aspirin. I will check a GI pathogen panel to rule out C. difficile colitis or other intestinal pathogens. If GI pathogen panel is negative, I would blame the diarrhea on the colchicine given the temporal relation.  I have recommended trying Imodium 2 tablets now and 1 tablet with every meal while taking colchicine to slow  down the diarrhea. If this is effective, I will continue this until the colchicine has been discontinued or until the patient develops constipation. If this is ineffective, I would recommend discontinuing colchicine and continuing on with high-dose aspirin alone for pericarditis. I will check a CBC as well as a CMP to evaluate for any electrolyte abnormalities stemming from the diarrhea

## 2016-12-26 NOTE — Telephone Encounter (Signed)
Error

## 2016-12-28 ENCOUNTER — Telehealth: Payer: Self-pay | Admitting: *Deleted

## 2016-12-28 ENCOUNTER — Telehealth: Payer: Self-pay | Admitting: Family Medicine

## 2016-12-28 DIAGNOSIS — M6281 Muscle weakness (generalized): Secondary | ICD-10-CM | POA: Diagnosis not present

## 2016-12-28 DIAGNOSIS — I25110 Atherosclerotic heart disease of native coronary artery with unstable angina pectoris: Secondary | ICD-10-CM | POA: Diagnosis not present

## 2016-12-28 DIAGNOSIS — G8929 Other chronic pain: Secondary | ICD-10-CM | POA: Diagnosis not present

## 2016-12-28 DIAGNOSIS — J8410 Pulmonary fibrosis, unspecified: Secondary | ICD-10-CM | POA: Diagnosis not present

## 2016-12-28 DIAGNOSIS — I252 Old myocardial infarction: Secondary | ICD-10-CM | POA: Diagnosis not present

## 2016-12-28 DIAGNOSIS — I4891 Unspecified atrial fibrillation: Secondary | ICD-10-CM | POA: Diagnosis not present

## 2016-12-28 DIAGNOSIS — M545 Low back pain: Secondary | ICD-10-CM | POA: Diagnosis not present

## 2016-12-28 DIAGNOSIS — M069 Rheumatoid arthritis, unspecified: Secondary | ICD-10-CM | POA: Diagnosis not present

## 2016-12-28 DIAGNOSIS — I10 Essential (primary) hypertension: Secondary | ICD-10-CM | POA: Diagnosis not present

## 2016-12-28 NOTE — Telephone Encounter (Signed)
Received call from patient. Requesting OV with PCP.  Patient voice noted extremely gravely and congested. Patient states that he has had chest tightness and difficulty breathing.   Advised to go to ER for SOB. Reports that he ha not been having SOB, it's only hard to breathe. Advised that he needs to go to ER for evaluation. States that he only wants appointment with PCP. Advised again to go to ER. Patient states that he will wait a while to see if it gets better.   Spoke with patient daughter, [name], who is currently with patient. States that she will call other sister to inform her. Call placed to patient daughter, Andreas Blower. No answer, LM on VM.

## 2016-12-28 NOTE — Telephone Encounter (Signed)
Amy lynch (home health) called about Evan [name] needing a nebulizer, he has the medication for it but does not have the machine and they would like for him to get one. Please call his daughter [name] if you have any questions. If you need amy for anything call [phone]

## 2016-12-29 LAB — GASTROINTESTINAL PATHOGEN PANEL PCR
C. difficile Tox A/B, PCR: NOT DETECTED
Campylobacter, PCR: NOT DETECTED
Cryptosporidium, PCR: NOT DETECTED
E coli (ETEC) LT/ST PCR: NOT DETECTED
E coli (STEC) stx1/stx2, PCR: NOT DETECTED
E coli 0157, PCR: NOT DETECTED
Giardia lamblia, PCR: NOT DETECTED
Norovirus, PCR: NOT DETECTED
Rotavirus A, PCR: NOT DETECTED
Salmonella, PCR: NOT DETECTED
Shigella, PCR: NOT DETECTED

## 2016-12-29 LAB — TIQ-NTM

## 2016-12-29 NOTE — Telephone Encounter (Signed)
Noted, he was just in hospital with no specific cause found.  I would be glad to see him if necessary.

## 2016-12-30 ENCOUNTER — Encounter: Payer: Self-pay | Admitting: Cardiology

## 2016-12-30 ENCOUNTER — Ambulatory Visit (INDEPENDENT_AMBULATORY_CARE_PROVIDER_SITE_OTHER): Payer: Medicare HMO | Admitting: Cardiology

## 2016-12-30 ENCOUNTER — Telehealth: Payer: Self-pay | Admitting: *Deleted

## 2016-12-30 VITALS — BP 118/74 | HR 55 | Ht 68.0 in | Wt 171.1 lb

## 2016-12-30 DIAGNOSIS — I300 Acute nonspecific idiopathic pericarditis: Secondary | ICD-10-CM | POA: Diagnosis not present

## 2016-12-30 DIAGNOSIS — M6281 Muscle weakness (generalized): Secondary | ICD-10-CM | POA: Diagnosis not present

## 2016-12-30 DIAGNOSIS — J8410 Pulmonary fibrosis, unspecified: Secondary | ICD-10-CM | POA: Diagnosis not present

## 2016-12-30 DIAGNOSIS — G8929 Other chronic pain: Secondary | ICD-10-CM | POA: Diagnosis not present

## 2016-12-30 DIAGNOSIS — I252 Old myocardial infarction: Secondary | ICD-10-CM | POA: Diagnosis not present

## 2016-12-30 DIAGNOSIS — I4891 Unspecified atrial fibrillation: Secondary | ICD-10-CM

## 2016-12-30 DIAGNOSIS — I10 Essential (primary) hypertension: Secondary | ICD-10-CM | POA: Diagnosis not present

## 2016-12-30 DIAGNOSIS — M545 Low back pain: Secondary | ICD-10-CM | POA: Diagnosis not present

## 2016-12-30 DIAGNOSIS — I25110 Atherosclerotic heart disease of native coronary artery with unstable angina pectoris: Secondary | ICD-10-CM | POA: Diagnosis not present

## 2016-12-30 DIAGNOSIS — M069 Rheumatoid arthritis, unspecified: Secondary | ICD-10-CM | POA: Diagnosis not present

## 2016-12-30 NOTE — Progress Notes (Signed)
12/30/2016 Evan [name]   07/18/1927  [id number]  Primary Physician Pickard, Cammie Mcgee, MD Primary Cardiologist: Dr. Irish Lack    Reason for Visit/CC: [place] hospital f/u for acute pericarditis.   HPI:  Evan [name] presents to clinic for post hospital f/u after recent admission for CP. He is a 83 year old male patient of [name] with a history of coronary artery disease and two-vessel CABG in 52 and cath in 2009 which showed patent stents and grafts. Unfortunately had lung cancer and received radiation therapy last year.   He presented recently to hospital with complaint of chest pain. Cardiac enzymes were negative. Chest CT negative. 2D echo showed normal LVEF and mild AS. He was noted to be in afib with a SVR in the 50s but had spontaneous conversion during hospitalization. His chest pain was felt likely secondary to acute pericarditis and he was placed on high dose ASA and colchicine. This resulted in improvement of symptoms. Echo was negative for pericardial effusion.  Given his afib, decision was made to hold off on initiation of a/c given need to be on high dose ASA given pericarditis. Dr. Debara Pickett recommended 2 weeks of high dose ASA. In addition, he was seen by pulmonology who did not suspect worsening lung CA nor PNA.   Recs per Dr. Debara Pickett: Will continue aspirin TID for 2 weeks stop and then colchicine for 3 months. Pt was discharged on 9/21. He presents back to clinic for post hospital. He feels ok, except he has had some diarrhea with colchicine, which has improved with imodium.   Current Meds  Medication Sig  . albuterol (VENTOLIN HFA) 108 (90 Base) MCG/ACT inhaler Inhale 1-2 puffs into the lungs every 6 (six) hours as needed for wheezing or shortness of breath.  Marland Kitchen aspirin 325 MG tablet Take 2 tablets (650 mg total) by mouth 3 (three) times daily.  Marland Kitchen atenolol (TENORMIN) 25 MG tablet Take 25 mg by mouth daily.  . Cholecalciferol 2000 units TABS Take 1 tablet (2,000 Units total) by mouth  daily.  . colchicine 0.6 MG tablet Take 1 tablet (0.6 mg total) by mouth 2 (two) times daily.  . Cyanocobalamin (VITAMIN B-12 PO) Take 1 tablet by mouth daily.  Marland Kitchen FLUoxetine (PROZAC) 20 MG capsule TAKE 20 mg  BY MOUTH DAILY  . ipratropium-albuterol (DUONEB) 0.5-2.5 (3) MG/3ML SOLN Take 3 mLs by nebulization every 4 (four) hours as needed (or Chest Pain / Tightness).  Marland Kitchen leflunomide (ARAVA) 10 MG tablet Take 10 mg by mouth daily.   Marland Kitchen Lifitegrast (XIIDRA) 5 % SOLN Place 1-2 drops into both eyes daily.  Marland Kitchen LORazepam (ATIVAN) 1 MG tablet Take 1mg  by mouth every morning; Take 0.5 mg at lunch; Take 1mg  at bedtime  . nitroGLYCERIN (NITROSTAT) 0.4 MG SL tablet Place 1 tablet (0.4 mg total) under the tongue every 5 (five) minutes as needed for chest pain.  . pantoprazole (PROTONIX) 40 MG tablet Take 1 tablet (40 mg total) by mouth daily.  . rosuvastatin (CRESTOR) 5 MG tablet TAKE 5 mg  EVERY DAY(NEED TO SCHEDULE YEARLY APPOINTMENT FOR AUGUST )  . Spacer/Aero-Holding Chambers (AEROCHAMBER PLUS WITH MASK) inhaler Use as instructed  . tamsulosin (FLOMAX) 0.4 MG CAPS capsule TAKE 0.4 gm by mouth EVERY DAY   Allergies  Allergen Reactions  . Feldene [Piroxicam] Other (See Comments)    REACTION: Blisters  . Statins Other (See Comments)    Myalgias  . Doxycycline Other (See Comments)    REACTION:  unknown  . Latex  Rash  . Tequin Anxiety  . Valium Other (See Comments)    REACTION:  unknown   Past Medical History:  Diagnosis Date  . Adenocarcinoma of right lung ([place]) 01/06/2016  . Anginal chest pain at rest [facility])    Chronic non, controlled on Lorazepam  . Anxiety   . Arthritis    "all over"   . BPH (benign prostatic hyperplasia)   . CAD (coronary artery disease)   . Chronic lower back pain   . Complication of anesthesia    "problems making water afterwards"  . Depression   . DJD (degenerative joint disease)   . Esophageal ulcer without bleeding    bid ppi indefinitely  . Family history of  adverse reaction to anesthesia    "children w/PONV"  . GERD (gastroesophageal reflux disease)   . Headache    "couple/week maybe" (09/04/2015)  . Hemochromatosis    Possible, elevated iron stores, hook-like osteophytes on hand films, normal LFTs  . Hepatitis    "yellow jaundice as a baby"  . History of ASCVD    MULTIVESSEL  . Hyperlipidemia   . Hypertension   . Myocardial infarction ([place]) 1999  . Osteoarthritis   . Pneumonia    "several times; got a little now" (09/04/2015)  . Rheumatoid arthritis ([place])   . Subdural hematoma (HCC)    small after fall 09/2016-plavix held, neurosurgery consulted.  Asymptomatic.     Family History  Problem Relation Age of Onset  . Arthritis-Osteo Sister   . Heart attack Brother   . Heart attack Other   . Cancer Neg Hx    Past Surgical History:  Procedure Laterality Date  . ARM SKIN LESION BIOPSY / EXCISION Left 09/02/2015  . CATARACT EXTRACTION W/ INTRAOCULAR LENS  IMPLANT, BILATERAL Bilateral   . CHOLECYSTECTOMY OPEN    . CORNEAL TRANSPLANT Bilateral    "one at [facility]; one at [facility]"  . CORONARY ANGIOPLASTY WITH STENT PLACEMENT    . CORONARY ARTERY BYPASS GRAFT  1999  . DESCENDING AORTIC ANEURYSM REPAIR W/ STENT     DE stent ostium into the right radial free graft at OM-, 02-2007  . ESOPHAGOGASTRODUODENOSCOPY N/A 11/09/2014   Procedure: ESOPHAGOGASTRODUODENOSCOPY (EGD);  Surgeon: [name], MD;  Location: [facility] ENDOSCOPY;  Service: Endoscopy;  Laterality: N/A;  . INGUINAL HERNIA REPAIR    . JOINT REPLACEMENT    . NASAL SINUS SURGERY    . SHOULDER OPEN ROTATOR CUFF REPAIR Right   . TOTAL KNEE ARTHROPLASTY Bilateral    Social History   Social History  . Marital status: Widowed    Spouse name: N/A  . Number of children: N/A  . Years of education: N/A   Occupational History  . Not on file.   Social History Main Topics  . Smoking status: Never Smoker  . Smokeless tobacco: Former Systems developer    Types: Chew     Comment: "quit chewing in the 1960s"  .  Alcohol use No  . Drug use: No  . Sexual activity: No   Other Topics Concern  . Not on file   Social History Narrative  . No narrative on file     Review of Systems: General: negative for chills, fever, night sweats or weight changes.  Cardiovascular: negative for chest pain, dyspnea on exertion, edema, orthopnea, palpitations, paroxysmal nocturnal dyspnea or shortness of breath Dermatological: negative for rash Respiratory: negative for cough or wheezing Urologic: negative for hematuria Abdominal: negative for nausea, vomiting, diarrhea, bright red blood per rectum, melena,  or hematemesis Neurologic: negative for visual changes, syncope, or dizziness All other systems reviewed and are otherwise negative except as noted above.   Physical Exam:  Blood pressure 118/74, pulse (!) 55, height 5\' 8"  (1.727 m), weight 171 lb 1.9 oz (77.6 kg), SpO2 99 %.  General appearance: alert, cooperative and no distress Neck: no carotid bruit and no JVD Lungs: clear to auscultation bilaterally Heart: regular rate and rhythm, S1, S2 normal, no murmur, click, rub or gallop Extremities: extremities normal, atraumatic, no cyanosis or edema Pulses: 2+ and symmetric Skin: Skin color, texture, turgor normal. No rashes or lesions Neurologic: Grossly normal  EKG  -- personally reviewed   ASSESSMENT AND PLAN:   1. Pericarditis: recent diagnosis. 2D echo w/o significant effusion (trival effusion, posterior to heart). Pt placed on high dose ASA TID x 2 weeks + colchcine to be continued x 3 months. We will have him wean off of high dose ASA. After 2 weeks TID, drop to BIB x 3 days, daily x 3 days, then regular dose of 81 mg daily thereafter. He has had some diarrhea with colchicine, improved with imodium.   2. PAF: pt had episode of afib during hospitalization but spontaneously converted. CHA2DS2 VASc score is 4, however decision was made not to anticoagulate now given high dose ASA therapy for acute  pericarditis. EKG looks to show sinus brady with PACs. HR in the mid 58s. He denies any symptoms. He has f/u with Dr. Irish Lack in 4 weeks. He will determine initiation of [place].   3. CAD: stable. No ischemic chest pain.   4. H/o Lung Cancer: s/p treatment with radiation.   Follow-Up Keep f/u with Dr. Irish Lack in 4 weeks.   Evan [name], MHS [facility] 12/30/2016 2:47 PM

## 2016-12-30 NOTE — Patient Instructions (Addendum)
Medication Instructions:   STARTING  October 4   YOU WILL :  1. TAKE ASPIRIN 325 MG  TWICE A DAY  FOR 3 DAYS   2. TAKE ASPIRIN  325 MG  ONCE A DAY  FOR 3 DAYS   3. START TAKING ASPIRIN  97 ONCE DAY    If you need a refill on your cardiac medications before your next appointment, please call your pharmacy.  Labwork: NONE ORDERED  TODAY    Testing/Procedures: NONE ORDERED  TODAY    Follow-Up:  KEEP APPOINTMENT AS SCHEDULED   Any Other Special Instructions Will Be Listed Below (If Applicable).

## 2016-12-30 NOTE — Telephone Encounter (Signed)
SPOKE TO PT DAUGHTER (DPR) BILLY JO THAT THE  DECREASE TO  BABY ASPIRIN 62 MG ONCE A DAY  IS OKAY FOR FATHER TO BE TAKING UNTIL FOLLOW UP VISIT WITH DR Irish Lack.

## 2016-12-30 NOTE — Telephone Encounter (Signed)
Rx for neb machine faxed to [facility] [phone]

## 2017-01-02 ENCOUNTER — Telehealth: Payer: Self-pay

## 2017-01-02 ENCOUNTER — Telehealth: Payer: Self-pay | Admitting: Cardiology

## 2017-01-02 DIAGNOSIS — I252 Old myocardial infarction: Secondary | ICD-10-CM | POA: Diagnosis not present

## 2017-01-02 DIAGNOSIS — M069 Rheumatoid arthritis, unspecified: Secondary | ICD-10-CM | POA: Diagnosis not present

## 2017-01-02 DIAGNOSIS — M545 Low back pain: Secondary | ICD-10-CM | POA: Diagnosis not present

## 2017-01-02 DIAGNOSIS — I4891 Unspecified atrial fibrillation: Secondary | ICD-10-CM | POA: Diagnosis not present

## 2017-01-02 DIAGNOSIS — I25110 Atherosclerotic heart disease of native coronary artery with unstable angina pectoris: Secondary | ICD-10-CM | POA: Diagnosis not present

## 2017-01-02 DIAGNOSIS — I10 Essential (primary) hypertension: Secondary | ICD-10-CM | POA: Diagnosis not present

## 2017-01-02 DIAGNOSIS — J8410 Pulmonary fibrosis, unspecified: Secondary | ICD-10-CM | POA: Diagnosis not present

## 2017-01-02 DIAGNOSIS — M6281 Muscle weakness (generalized): Secondary | ICD-10-CM | POA: Diagnosis not present

## 2017-01-02 DIAGNOSIS — G8929 Other chronic pain: Secondary | ICD-10-CM | POA: Diagnosis not present

## 2017-01-02 MED ORDER — COLCHICINE 0.6 MG PO TABS
0.6000 mg | ORAL_TABLET | Freq: Every day | ORAL | 0 refills | Status: DC
Start: 2017-01-02 — End: 2017-01-31

## 2017-01-02 NOTE — Telephone Encounter (Signed)
Patient of Dr. Irish Lack who was started on colchicine in the hospital (9/21 discharge summary: The pt was started on hig dose ASA and colchicine for presumed pericarditis and had improvement in his symptoms. He also converted spontaneously to NSR before discharge.)    Routed to [facility] Triage

## 2017-01-02 NOTE — Telephone Encounter (Signed)
Patient's daughter calling and states that the patient has been having multiple episodes (up to 13 times in 1 day) of diarrhea since starting colchicine. Patient was started on high dose ASA and colchicine in the hospital 9/21 for pericarditis. Patient is currently taking ASA 650 mg BID with the plan to wean to 81 mg QD. Patient is currently taking colchicine 0.6 mg BID. Patient has already tried taking imodium. Daughter is afraid patient is going to become dehydrated. Discussed with DOD who states that we could possibly try high dose ibuprofen but recommended that the information be routed to primary cardiologist for advisement. Advised for the patient to stay hydrated in the meantime and that we would call with further recommendation. Daughter verbalized understanding and thanked me for the call.

## 2017-01-02 NOTE — Telephone Encounter (Signed)
Left message for daughter to call back.  

## 2017-01-02 NOTE — Telephone Encounter (Signed)
Made patient's daughter aware of Dr. Hassell Done recommendations to decrease colchicine to 0.6 mg ONCE daily. Daughter verbalized understanding and thanked me for the call.

## 2017-01-02 NOTE — Telephone Encounter (Signed)
Evan [name] called and requested [facility]bul[facility]. [facility] can not fill it. [facility] fa [facility]

## 2017-01-02 NOTE — Telephone Encounter (Signed)
Lets decrease colchicine to 0.6 once a day.

## 2017-01-02 NOTE — Telephone Encounter (Signed)
New message      Pt c/o medication issue:  1. Name of Medication:  colchicine 2. How are you currently taking this medication (dosage and times per day)?  0.6mg  3. Are you having a reaction (difficulty breathing--STAT)? no 4. What is your medication issue?  Since starting medication, pt has been having diarrhea----sometimes up to 13 times a day.  Daughter is worried pt may get dehydrated.  Diarrhea is "somewhat" better but still everyday.  Please advise

## 2017-01-04 ENCOUNTER — Telehealth: Payer: Self-pay

## 2017-01-04 DIAGNOSIS — R0602 Shortness of breath: Secondary | ICD-10-CM | POA: Diagnosis not present

## 2017-01-04 DIAGNOSIS — I25110 Atherosclerotic heart disease of native coronary artery with unstable angina pectoris: Secondary | ICD-10-CM | POA: Diagnosis not present

## 2017-01-04 DIAGNOSIS — R269 Unspecified abnormalities of gait and mobility: Secondary | ICD-10-CM | POA: Diagnosis not present

## 2017-01-04 DIAGNOSIS — I252 Old myocardial infarction: Secondary | ICD-10-CM | POA: Diagnosis not present

## 2017-01-04 DIAGNOSIS — M545 Low back pain: Secondary | ICD-10-CM | POA: Diagnosis not present

## 2017-01-04 DIAGNOSIS — M6281 Muscle weakness (generalized): Secondary | ICD-10-CM | POA: Diagnosis not present

## 2017-01-04 DIAGNOSIS — I4891 Unspecified atrial fibrillation: Secondary | ICD-10-CM | POA: Diagnosis not present

## 2017-01-04 DIAGNOSIS — M069 Rheumatoid arthritis, unspecified: Secondary | ICD-10-CM | POA: Diagnosis not present

## 2017-01-04 DIAGNOSIS — R296 Repeated falls: Secondary | ICD-10-CM | POA: Diagnosis not present

## 2017-01-04 DIAGNOSIS — I10 Essential (primary) hypertension: Secondary | ICD-10-CM | POA: Diagnosis not present

## 2017-01-04 DIAGNOSIS — G8929 Other chronic pain: Secondary | ICD-10-CM | POA: Diagnosis not present

## 2017-01-04 DIAGNOSIS — J8410 Pulmonary fibrosis, unspecified: Secondary | ICD-10-CM | POA: Diagnosis not present

## 2017-01-04 DIAGNOSIS — J449 Chronic obstructive pulmonary disease, unspecified: Secondary | ICD-10-CM | POA: Diagnosis not present

## 2017-01-04 DIAGNOSIS — J4520 Mild intermittent asthma, uncomplicated: Secondary | ICD-10-CM | POA: Diagnosis not present

## 2017-01-04 NOTE — Telephone Encounter (Signed)
Patient called states he is having trouble breathing and is feeling weak patient wanted to make an appointment. Patient refused to go to ER states it is too far I offered to call ambulance patient refused stated he would wait until 10/4 to see provider.There is someone at the home with the patient.I made an appointment for 10/4 as requested by patient

## 2017-01-05 ENCOUNTER — Ambulatory Visit: Payer: Self-pay | Admitting: Family Medicine

## 2017-01-08 ENCOUNTER — Encounter (HOSPITAL_COMMUNITY): Payer: Self-pay | Admitting: *Deleted

## 2017-01-08 ENCOUNTER — Emergency Department (HOSPITAL_COMMUNITY)
Admission: EM | Admit: 2017-01-08 | Discharge: 2017-01-08 | Disposition: A | Discharge status: Home / Self Care | Payer: Medicare HMO | Attending: Emergency Medicine | Admitting: Emergency Medicine

## 2017-01-08 ENCOUNTER — Emergency Department (HOSPITAL_COMMUNITY): Payer: Medicare HMO

## 2017-01-08 DIAGNOSIS — I10 Essential (primary) hypertension: Secondary | ICD-10-CM | POA: Diagnosis not present

## 2017-01-08 DIAGNOSIS — Z79899 Other long term (current) drug therapy: Secondary | ICD-10-CM | POA: Insufficient documentation

## 2017-01-08 DIAGNOSIS — I25110 Atherosclerotic heart disease of native coronary artery with unstable angina pectoris: Secondary | ICD-10-CM | POA: Diagnosis not present

## 2017-01-08 DIAGNOSIS — Z951 Presence of aortocoronary bypass graft: Secondary | ICD-10-CM | POA: Diagnosis not present

## 2017-01-08 DIAGNOSIS — I309 Acute pericarditis, unspecified: Secondary | ICD-10-CM | POA: Insufficient documentation

## 2017-01-08 DIAGNOSIS — Z96653 Presence of artificial knee joint, bilateral: Secondary | ICD-10-CM | POA: Insufficient documentation

## 2017-01-08 DIAGNOSIS — J984 Other disorders of lung: Secondary | ICD-10-CM | POA: Diagnosis not present

## 2017-01-08 DIAGNOSIS — R5383 Other fatigue: Secondary | ICD-10-CM | POA: Diagnosis not present

## 2017-01-08 DIAGNOSIS — Z7982 Long term (current) use of aspirin: Secondary | ICD-10-CM | POA: Diagnosis not present

## 2017-01-08 DIAGNOSIS — J45909 Unspecified asthma, uncomplicated: Secondary | ICD-10-CM | POA: Diagnosis not present

## 2017-01-08 DIAGNOSIS — Z85118 Personal history of other malignant neoplasm of bronchus and lung: Secondary | ICD-10-CM | POA: Diagnosis not present

## 2017-01-08 DIAGNOSIS — R079 Chest pain, unspecified: Secondary | ICD-10-CM | POA: Diagnosis present

## 2017-01-08 DIAGNOSIS — Z87891 Personal history of nicotine dependence: Secondary | ICD-10-CM | POA: Insufficient documentation

## 2017-01-08 LAB — BASIC METABOLIC PANEL
Anion gap: 9 (ref 5–15)
BUN: 16 mg/dL (ref 6–20)
CO2: 21 mmol/L — ABNORMAL LOW (ref 22–32)
Calcium: 9.1 mg/dL (ref 8.9–10.3)
Chloride: 108 mmol/L (ref 101–111)
Creatinine, Ser: 1.13 mg/dL (ref 0.61–1.24)
GFR calc Af Amer: >60 mL/min (ref >60)
GFR calc non Af Amer: 56 mL/min — ABNORMAL LOW (ref >60)
Glucose, Bld: 125 mg/dL — ABNORMAL HIGH (ref 65–99)
Potassium: 4.3 mmol/L (ref 3.5–5.1)
Sodium: 138 mmol/L (ref 135–145)

## 2017-01-08 LAB — CBC
HCT: 36.1 % — ABNORMAL LOW (ref 39.0–52.0)
Hemoglobin: 11.8 g/dL — ABNORMAL LOW (ref 13.0–17.0)
MCH: 30.2 pg (ref 26.0–34.0)
MCHC: 32.7 g/dL (ref 30.0–36.0)
MCV: 92.3 fL (ref 78.0–100.0)
Platelets: 78 10*3/uL — ABNORMAL LOW (ref 150–400)
RBC: 3.91 MIL/uL — ABNORMAL LOW (ref 4.22–5.81)
RDW: 14.2 % (ref 11.5–15.5)
WBC: 4.5 10*3/uL (ref 4.0–10.5)

## 2017-01-08 LAB — I-STAT TROPONIN, ED
Troponin i, poc: 0.02 ng/mL (ref 0.00–0.08)
Troponin i, poc: 0 ng/mL (ref 0.00–0.08)

## 2017-01-08 NOTE — ED Provider Notes (Signed)
[place] DEPT Provider Note   CSN: [id number] Arrival date & time: 01/08/17  0820     History   Chief Complaint Chief Complaint  Patient presents with  . Chest Pain  . Shortness of Breath    HPI Evan [name] is a 83 y.o. male.  Patient with recent admission in September 18 for chest pain. During that hospitalization cardiac markers were negative. Final diagnosis was pericarditis. Patient started on high-dose aspirin and colchicine. Patient has had follow-up as an outpatient with cardiology since discharge. That was about a week ago. Patient presents today with still feeling fatigued still having shortness of breath and still having some persistent left-sided chest tightness. Patient's diarrhea side effect from the colchicine has improved. Having about 2-3 bowel movements every other day. Patient states that the sharp left-sided chest pain started about 3 this morning. It has resolved prior to arrival here just had a tight feeling. But he has a tight feeling in that side of the chest almost all the time.  In addition during the hospitalization patient noted to be in intermittent atrial fibrillation. Due to his current treatment they opted not to start him on blood thinners.      Past Medical History:  Diagnosis Date  . Adenocarcinoma of right lung ([place]) 01/06/2016  . Anginal chest pain at rest [facility])    Chronic non, controlled on Lorazepam  . Anxiety   . Arthritis    "all over"   . BPH (benign prostatic hyperplasia)   . CAD (coronary artery disease)   . Chronic lower back pain   . Complication of anesthesia    "problems making water afterwards"  . Depression   . DJD (degenerative joint disease)   . Esophageal ulcer without bleeding    bid ppi indefinitely  . Family history of adverse reaction to anesthesia    "children w/PONV"  . GERD (gastroesophageal reflux disease)   . Headache    "couple/week maybe" (09/04/2015)  . Hemochromatosis    Possible, elevated iron  stores, hook-like osteophytes on hand films, normal LFTs  . Hepatitis    "yellow jaundice as a baby"  . History of ASCVD    MULTIVESSEL  . Hyperlipidemia   . Hypertension   . Myocardial infarction ([place]) 1999  . Osteoarthritis   . Pneumonia    "several times; got a little now" (09/04/2015)  . Rheumatoid arthritis ([place])   . Subdural hematoma (HCC)    small after fall 09/2016-plavix held, neurosurgery consulted.  Asymptomatic.      Patient Active Problem List   Diagnosis Date Noted  . Acute pericarditis   . Chest pain at rest 12/20/2016  . Unstable angina ([place]) 12/20/2016  . Radiation pneumonitis ([place]) 11/09/2016  . Pleurisy 10/11/2016  . Chest wall pain 10/11/2016  . Atrial fibrillation ([place]) 05/21/2016  . Adenocarcinoma of right lung ([place]) 01/06/2016  . GAD (generalized anxiety disorder) 11/20/2015  . Essential hypertension   . Cellulitis 09/04/2015  . Cellulitis of left axilla   . Esophageal ulcer without bleeding   . Bilateral lower extremity edema 04/28/2014  . BPH (benign prostatic hyperplasia) 10/08/2012  . Anxiety   . EKG abnormalities 09/05/2012  . Tremor 09/05/2012  . Chronic fatigue 09/05/2012  . Arthritis   . Asthma, chronic   . Reflux esophagitis   . S/P CABG (coronary artery bypass graft)   . GERD (gastroesophageal reflux disease)   . Hyperlipidemia   . Hypertension   . UNSPECIFIED PERIPHERAL VASCULAR DISEASE 06/16/2009  Past Surgical History:  Procedure Laterality Date  . ARM SKIN LESION BIOPSY / EXCISION Left 09/02/2015  . CATARACT EXTRACTION W/ INTRAOCULAR LENS  IMPLANT, BILATERAL Bilateral   . CHOLECYSTECTOMY OPEN    . CORNEAL TRANSPLANT Bilateral    "one at [facility]; [facility] at [facility]"  . CORONARY ANGIOPLASTY WITH STENT PLACEMENT    . CORONARY ARTERY BYPASS GRAFT  1999  . DESCENDING AORTIC ANEURYSM REPAIR W/ STENT     DE stent ostium into the right radial free graft at OM-, 02-2007  . ESOPHAGOGASTRODUODENOSCOPY N/A 11/09/2014   Procedure:  ESOPHAGOGASTRODUODENOSCOPY (EGD);  Surgeon: [name], MD;  Location[facility] ENDOSCOPY;  Service: Endoscopy;  Laterality: N/A;  . INGUINAL HERNIA REPAIR    . JOINT REPLACEMENT    . NASAL SINUS SURGERY    . SHOULDER OPEN ROTATOR CUFF REPAIR Right   . TOTAL KNEE ARTHROPLASTY Bilateral        Home Medications    Prior to Admission medications   Medication Sig Start Date End Date Taking? Authorizing Provider  aspirin 325 MG EC tablet Take 325 mg by mouth daily.   Yes [provider]  atenolol (TENORMIN) 25 MG tablet Take 25 mg by mouth daily.   Yes [provider]  Cholecalciferol 2000 units TABS Take 1 tablet (2,000 Units total) by mouth daily. 02/04/16  Yes [name], MD  colchicine 0.6 MG tablet Take 1 tablet (0.6 mg total) by mouth daily. Patient taking differently: Take 0.6 mg by mouth 2 (two) times daily.  01/02/17 04/17/17 Yes Jettie Booze, MD  Cyanocobalamin (VITAMIN B-12 PO) Take 1 tablet by mouth daily.   Yes [provider]  FLUoxetine (PROZAC) 20 MG capsule TAKE 20 mg  BY MOUTH DAILY 12/23/16  Yes Kilroy, Luke K, PA-C  ipratropium-albuterol (DUONEB) 0.5-2.5 (3) MG/3ML SOLN Take 3 mLs by nebulization every 4 (four) hours as needed (or Chest Pain / Tightness). 12/23/16  Yes Kilroy, Luke K, PA-C  leflunomide (ARAVA) 10 MG tablet Take 10 mg by mouth daily.  08/01/15  Yes [provider]  Lifitegrast Shirley Friar) 5 % SOLN Place 1-2 drops into both eyes daily.   Yes [provider]  loperamide (IMODIUM A-D) 2 MG tablet Take 2-4 mg by mouth as needed for diarrhea or loose stools.   Yes [provider]  LORazepam (ATIVAN) 1 MG tablet Take 1mg  by mouth every morning; Take 0.5 mg at lunch; Take 1mg  at bedtime 12/23/16  Yes Kilroy, Luke K, PA-C  nitroGLYCERIN (NITROSTAT) 0.4 MG SL tablet Place 1 tablet (0.4 mg total) under the tongue every 5 (five) minutes as needed for chest pain. 12/23/16  Yes Arbutus Leas, NP  pantoprazole (PROTONIX)  40 MG tablet Take 1 tablet (40 mg total) by mouth daily. 11/02/15  Yes Jettie Booze, MD  rosuvastatin (CRESTOR) 5 MG tablet TAKE 5 mg  EVERY DAY(NEED TO SCHEDULE YEARLY APPOINTMENT FOR AUGUST ) 12/23/16  Yes Kilroy, Luke K, PA-C  saccharomyces boulardii (FLORASTOR) 250 MG capsule Take 250 mg by mouth daily as needed (for stomach).   Yes [provider]  tamsulosin (FLOMAX) 0.4 MG CAPS capsule TAKE 0.4 gm by mouth EVERY DAY 12/23/16  Yes Kilroy, Luke K, PA-C  albuterol (VENTOLIN HFA) 108 (90 Base) MCG/ACT inhaler Inhale 1-2 puffs into the lungs every 6 (six) hours as needed for wheezing or shortness of breath. 05/26/16   [name], MD  Spacer/Aero-Holding Chambers (AEROCHAMBER PLUS WITH MASK) inhaler Use as instructed 10/19/15  Charlesetta Shanks, MD    Family History Family History  Problem Relation Age of Onset  . Arthritis-Osteo Sister   . Heart attack Brother   . Heart attack Other   . Cancer Neg Hx     Social History Social History  Substance Use Topics  . Smoking status: Never Smoker  . Smokeless tobacco: Former Systems developer    Types: Chew     Comment: "quit chewing in the 1960s"  . Alcohol use No     Allergies   Feldene [piroxicam]; Statins; Doxycycline; Latex; Tequin; and Valium   Review of Systems Review of Systems  Constitutional: Positive for fatigue.  HENT: Negative for congestion.   Eyes: Negative for redness.  Respiratory: Positive for shortness of breath.   Cardiovascular: Positive for chest pain.  Gastrointestinal: Negative for abdominal pain, nausea and vomiting.  Genitourinary: Negative for dysuria.  Musculoskeletal: Negative for back pain.  Skin: Negative for rash.  Neurological: Negative for headaches.  Hematological: Does not bruise/bleed easily.  Psychiatric/Behavioral: Negative for confusion.     Physical Exam Updated Vital Signs BP 116/78 (BP Location: Right Arm)   Pulse 85   Temp (!) 97.5 F (36.4 C) (Oral)   Resp 18   SpO2 97%    Physical Exam  Constitutional: He is oriented to person, place, and time. He appears well-developed and well-nourished. No distress.  HENT:  Head: Normocephalic and atraumatic.  Mouth/Throat: Oropharynx is clear and moist.  Eyes: Pupils are equal, round, and reactive to light. Conjunctivae and EOM are normal.  Neck: Normal range of motion. Neck supple.  Cardiovascular: Normal rate, regular rhythm and normal heart sounds.   Pulmonary/Chest: Effort normal and breath sounds normal. No respiratory distress.  Abdominal: Soft. Bowel sounds are normal. There is no tenderness.  Musculoskeletal: Normal range of motion.  Neurological: He is alert and oriented to person, place, and time. No cranial nerve deficit or sensory deficit. He exhibits normal muscle tone. Coordination normal.  Skin: Skin is warm. No rash noted.  Nursing note and vitals reviewed.    ED Treatments / Results  Labs (all labs ordered are listed, but only abnormal results are displayed) Labs Reviewed  BASIC METABOLIC PANEL - Abnormal; Notable for the following:       Result Value   CO2 21 (*)    Glucose, Bld 125 (*)    GFR calc non Af Amer 56 (*)    All other components within normal limits  CBC - Abnormal; Notable for the following:    RBC 3.91 (*)    Hemoglobin 11.8 (*)    HCT 36.1 (*)    Platelets 78 (*)    All other components within normal limits  I-STAT TROPONIN, ED  I-STAT TROPONIN, ED    EKG  EKG Interpretation  Date/Time:  Sunday January 08 2017 08:23:29 EDT Ventricular Rate:  80 PR Interval:    QRS Duration: 90 QT Interval:  426 QTC Calculation: 491 R Axis:   -2 Text Interpretation:  Atrial fibrillation with a competing junctional pacemaker Minimal voltage criteria for LVH, may be normal variant Septal infarct , age undetermined Abnormal ECG Confirmed by Fredia Sorrow [phone]) on 01/08/2017 9:09:51 AM       Radiology Dg Chest 2 View  Result Date: 01/08/2017 CLINICAL DATA:  Hervey Ard left-sided  chest and abdominal pain beginning 6 hours ago. EXAM: CHEST  2 VIEW COMPARISON:  12/20/2016 FINDINGS: Previous median sternotomy. Mild chronic cardiomegaly. Aortic atherosclerosis. Chronic interstitial lung markings. No sign of active infiltrate,  mass, effusion or collapse. Vascularity unremarkable. IMPRESSION: Chronic pulmonary scarring. Cardiomegaly. Aortic atherosclerosis. No active disease identified. Electronically Signed   By: Nelson Chimes M.D.   On: 01/08/2017 09:10    Procedures Procedures (including critical care time)  Medications Ordered in ED Medications - No data to display   Initial Impression / Assessment and Plan / ED Course  I have reviewed the triage vital signs and the nursing notes.  Pertinent labs & imaging results that were available during my care of the patient were reviewed by me and considered in my medical decision making (see chart for details).     Suspect symptoms are a baseline from the pericarditis. Workup here without any acute findings. Patient not hypoxic oxygen saturation are in the upper 90s. Chest x-ray negative. Troponins 2 negative. Electrolytes are without significant abnormalities. Patient stable for discharge home and follow-up with cardiology.  Final Clinical Impressions(s) / ED Diagnoses   Final diagnoses:  Acute pericarditis, unspecified type  Fatigue, unspecified type    New Prescriptions New Prescriptions   No medications on file     Fredia Sorrow, MD 01/08/17 1250

## 2017-01-08 NOTE — ED Notes (Signed)
Pt denies concerns with dc 

## 2017-01-08 NOTE — Discharge Instructions (Signed)
Continue current medications. Make an appointment to follow-up with cardiology in the next week. Return for any new or worse symptoms. Today's workup without any significant abnormalities.

## 2017-01-08 NOTE — ED Notes (Signed)
Pt ambulated to bathroom independently

## 2017-01-08 NOTE — ED Triage Notes (Signed)
Pt reports onset of sharp left side chest/upper abd pain this am 0300. Reports that he feels sob and like his abd is bloated. No acute distress is noted and ekg done at triage.

## 2017-01-11 DIAGNOSIS — M6281 Muscle weakness (generalized): Secondary | ICD-10-CM | POA: Diagnosis not present

## 2017-01-11 DIAGNOSIS — G8929 Other chronic pain: Secondary | ICD-10-CM | POA: Diagnosis not present

## 2017-01-11 DIAGNOSIS — M545 Low back pain: Secondary | ICD-10-CM | POA: Diagnosis not present

## 2017-01-11 DIAGNOSIS — J8410 Pulmonary fibrosis, unspecified: Secondary | ICD-10-CM | POA: Diagnosis not present

## 2017-01-11 DIAGNOSIS — I4891 Unspecified atrial fibrillation: Secondary | ICD-10-CM | POA: Diagnosis not present

## 2017-01-11 DIAGNOSIS — I10 Essential (primary) hypertension: Secondary | ICD-10-CM | POA: Diagnosis not present

## 2017-01-11 DIAGNOSIS — M069 Rheumatoid arthritis, unspecified: Secondary | ICD-10-CM | POA: Diagnosis not present

## 2017-01-11 DIAGNOSIS — I25110 Atherosclerotic heart disease of native coronary artery with unstable angina pectoris: Secondary | ICD-10-CM | POA: Diagnosis not present

## 2017-01-11 DIAGNOSIS — I252 Old myocardial infarction: Secondary | ICD-10-CM | POA: Diagnosis not present

## 2017-01-12 DIAGNOSIS — M069 Rheumatoid arthritis, unspecified: Secondary | ICD-10-CM | POA: Diagnosis not present

## 2017-01-12 DIAGNOSIS — I252 Old myocardial infarction: Secondary | ICD-10-CM | POA: Diagnosis not present

## 2017-01-12 DIAGNOSIS — G8929 Other chronic pain: Secondary | ICD-10-CM | POA: Diagnosis not present

## 2017-01-12 DIAGNOSIS — I10 Essential (primary) hypertension: Secondary | ICD-10-CM | POA: Diagnosis not present

## 2017-01-12 DIAGNOSIS — I4891 Unspecified atrial fibrillation: Secondary | ICD-10-CM | POA: Diagnosis not present

## 2017-01-12 DIAGNOSIS — J8410 Pulmonary fibrosis, unspecified: Secondary | ICD-10-CM | POA: Diagnosis not present

## 2017-01-12 DIAGNOSIS — M545 Low back pain: Secondary | ICD-10-CM | POA: Diagnosis not present

## 2017-01-12 DIAGNOSIS — I25110 Atherosclerotic heart disease of native coronary artery with unstable angina pectoris: Secondary | ICD-10-CM | POA: Diagnosis not present

## 2017-01-12 DIAGNOSIS — M6281 Muscle weakness (generalized): Secondary | ICD-10-CM | POA: Diagnosis not present

## 2017-01-16 ENCOUNTER — Other Ambulatory Visit: Payer: Self-pay | Admitting: Family Medicine

## 2017-01-16 DIAGNOSIS — M79673 Pain in unspecified foot: Secondary | ICD-10-CM | POA: Diagnosis not present

## 2017-01-16 DIAGNOSIS — Z79899 Other long term (current) drug therapy: Secondary | ICD-10-CM | POA: Diagnosis not present

## 2017-01-16 DIAGNOSIS — M353 Polymyalgia rheumatica: Secondary | ICD-10-CM | POA: Diagnosis not present

## 2017-01-16 DIAGNOSIS — M159 Polyosteoarthritis, unspecified: Secondary | ICD-10-CM | POA: Diagnosis not present

## 2017-01-16 DIAGNOSIS — M810 Age-related osteoporosis without current pathological fracture: Secondary | ICD-10-CM | POA: Diagnosis not present

## 2017-01-16 DIAGNOSIS — M064 Inflammatory polyarthropathy: Secondary | ICD-10-CM | POA: Diagnosis not present

## 2017-01-16 DIAGNOSIS — M25569 Pain in unspecified knee: Secondary | ICD-10-CM | POA: Diagnosis not present

## 2017-01-16 NOTE — Telephone Encounter (Signed)
ok 

## 2017-01-16 NOTE — Telephone Encounter (Signed)
Last OV 9/24 Last refill 9/21 Ok to refill?

## 2017-01-17 ENCOUNTER — Ambulatory Visit (INDEPENDENT_AMBULATORY_CARE_PROVIDER_SITE_OTHER): Payer: Medicare HMO | Admitting: Family Medicine

## 2017-01-17 ENCOUNTER — Encounter: Payer: Self-pay | Admitting: Family Medicine

## 2017-01-17 VITALS — BP 118/68 | HR 88 | Temp 97.6°F | Resp 16 | Ht 68.0 in | Wt 173.0 lb

## 2017-01-17 DIAGNOSIS — R5382 Chronic fatigue, unspecified: Secondary | ICD-10-CM | POA: Diagnosis not present

## 2017-01-17 NOTE — Progress Notes (Signed)
Subjective:    Patient ID: Evan [name], male    DOB: [birth year], 83 y.o.   MRN: [id number]  HPI Was recently admitted to the hospital with chest pain. I have copied relevant portions of the discharge summary and included them below for my reference:  Admit date: 12/20/2016 Discharge date: 12/23/2016  Primary Care Provider: [name], MD Primary Cardiologist: Dr Irish Lack  Discharge Diagnoses Principal Problem:   Chest pain at rest Active Problems:   Hyperlipidemia   Hypertension   S/P CABG (coronary artery bypass graft)   Adenocarcinoma of right lung [facility])   Atrial fibrillation ([place])   Unstable angina ([place])   Acute pericarditis    Procedures: Echo 12/21/17                        Chest CT 12/21/16   Hospital Course:  This is a pleasant 83 year old male patient of [name] with a history of coronary artery disease and two-vessel CABG in 34 and cath in 2009 which showed patent stents and grafts. Unfortunately had lung cancer and received radiation therapy last year. His last stress test was more than a year ago. He now presents with chest pain. He says he is actually had slightly more chest pain since he's been here in the ER. Blood pressure is elevated at 378 systolic. He is found to be in atrial fibrillation but with slow ventricular response and heart rate of 50. I agree with Ms. Ingold's physical exam findings. Initial troponin is negative at 0.02.   The pt had continued pain but Troponin's were negative and we felt this was probably not ischemic. Chest CT was done and pt was seen by the pulmonary service. Echoshowed stable LVEF and mild AS. The pt was started on hig dose ASA and colchicine for presumed pericarditis and had improvement in his symptoms. He also converted spontaneously to NSR before discharge. His CHADSVASC score is 4. After discussion with Dr Debara Pickett we felt it would be best to hold off on anticoagulation till he completes two weeks of high dose ASA  and he is seen by Dr Irish Lack as an OP.  Pulmonary did not suspect worsening lung CA or overlying pneumonia.     Discharge Vitals:  Blood pressure 138/69, pulse (!) 55, temperature 98.1 F (36.7 C), temperature source Oral, resp. rate (!) 22, height 5' 8" (1.727 m), weight 172 lb 1.6 oz (78.1 kg), SpO2 99 %.    Labs: Lab Results Last 24 Hours       Results for orders placed or performed during the hospital encounter of 12/20/16 (from the past 24 hour(s))  CBC     Status: Abnormal   Collection Time: 12/23/16  4:04 AM  Result Value Ref Range   WBC 5.2 4.0 - 10.5 K/uL   RBC 3.85 (L) 4.22 - 5.81 MIL/uL   Hemoglobin 11.6 (L) 13.0 - 17.0 g/dL   HCT 35.8 (L) 39.0 - 52.0 %   MCV 93.0 78.0 - 100.0 fL   MCH 30.1 26.0 - 34.0 pg   MCHC 32.4 30.0 - 36.0 g/dL   RDW 13.8 11.5 - 15.5 %   Platelets 93 (L) 150 - 400 K/uL  Basic metabolic panel     Status: Abnormal   Collection Time: 12/23/16  4:04 AM  Result Value Ref Range   Sodium 136 135 - 145 mmol/L   Potassium 4.4 3.5 - 5.1 mmol/L   Chloride 107 101 - 111 mmol/L  CO2 22 22 - 32 mmol/L   Glucose, Bld 108 (H) 65 - 99 mg/dL   BUN 21 (H) 6 - 20 mg/dL   Creatinine, Ser 1.39 (H) 0.61 - 1.24 mg/dL   Calcium 8.4 (L) 8.9 - 10.3 mg/dL   GFR calc non Af Amer 44 (L) >60 mL/min   GFR calc Af Amer 51 (L) >60 mL/min   Anion gap 7 5 - 15      Discharge Medications:      Allergies as of 12/23/2016      Reactions   Feldene [piroxicam] Other (See Comments)   REACTION: Blisters   Statins Other (See Comments)   Myalgias   Doxycycline Other (See Comments)   REACTION:  unknown   Latex Rash   Tequin Anxiety   Valium Other (See Comments)   REACTION:  unknown                           Medication List               STOP taking these medications            amoxicillin 500 MG capsule Commonly known as:  AMOXIL    clopidogrel 75 MG tablet Commonly known as:  PLAVIX                         TAKE these medications            aerochamber plus with mask inhaler Use as instructed    albuterol 108 (90 Base) MCG/ACT inhaler Commonly known as:  VENTOLIN HFA Inhale 1-2 puffs into the lungs every 6 (six) hours as needed for wheezing or shortness of breath.    aspirin 325 MG tablet Take 2 tablets (650 mg total) by mouth 3 (three) times daily.    atenolol 50 MG tablet Commonly known as:  TENORMIN Take 0.5 tablets (25 mg total) by mouth daily. Please call and schedule follow up. [phone] What changed:  how much to take    Cholecalciferol 2000 units Tabs Take 1 tablet (2,000 Units total) by mouth daily.    colchicine 0.6 MG tablet Take 1 tablet (0.6 mg total) by mouth 2 (two) times daily.    FLUoxetine 20 MG capsule Commonly known as:  PROZAC TAKE 20 mg  BY MOUTH DAILY    ipratropium-albuterol 0.5-2.5 (3) MG/3ML Soln Commonly known as:  DUONEB Take 3 mLs by nebulization every 4 (four) hours as needed (or Chest Pain / Tightness).    leflunomide 10 MG tablet Commonly known as:  ARAVA Take 10 mg by mouth daily.    LORazepam 1 MG tablet Commonly known as:  ATIVAN Take 59m by mouth every morning; Take 0.5 mg at lunch; Take 158mat bedtime    nitroGLYCERIN 0.4 MG SL tablet Commonly known as:  NITROSTAT Place 1 tablet (0.4 mg total) under the tongue every 5 (five) minutes as needed for chest pain.    pantoprazole 40 MG tablet Commonly known as:  PROTONIX Take 1 tablet (40 mg total) by mouth daily.    rosuvastatin 5 MG tablet Commonly known as:  CRESTOR TAKE 5 mg  EVERY DAY(NEED TO SCHEDULE YEARLY APPOINTMENT FOR AUGUST )    tamsulosin 0.4 MG Caps capsule Commonly known as:  FLOMAX TAKE 0.4 gm by mouth EVERY DAY    VITAMIN B-12 PO Take 1 tablet by mouth daily.    XIIDRA 5 % Soln  Generic drug:  Lifitegrast Place 1-2 drops into both eyes daily.    Echo in the hospital revealed: Study Conclusions  - Left ventricle: The cavity size was  normal. There was moderate   concentric hypertrophy. Systolic function was normal. The   estimated ejection fraction was in the range of 60% to 65%. Wall   motion was normal; there were no regional wall motion   abnormalities. - Aortic valve: Severely calcified annulus. Trileaflet; mildly   thickened, mildly calcified leaflets. There was mild stenosis.   Valve area (VTI): 1.38 cm^2. Valve area (Vmax): 1.08 cm^2. Valve   area (Vmean): 1.43 cm^2. - Aorta: Aortic root dimension: 40 mm (ED). - Aortic root: The aortic root was mildly dilated. - Mitral valve: Calcified annulus. There was mild regurgitation. - Left atrium: The atrium was severely dilated. - Right atrium: The atrium was moderately dilated. - Tricuspid valve: There was mild regurgitation. - Pulmonary arteries: PA peak pressure: 32 mm Hg (S). - Pericardium, extracardiac: A trivial, free-flowing pericardial   effusion was identified posterior to the heart. The fluid had no   internal echoes.   12/26/16 Ever since starting colchicine and being discharged from the hospital, the patient has had copious diarrhea. He is taking colchicine twice a day for his pericarditis in addition to high-dose aspirin. He denies any melena. He denies any fevers or chills. He denies any hematochezia. His daughter is trying her best to keep him hydrated. At the present time they have not tried any medication specifically for diarrhea. He does have a history of C. difficile colitis however his daughter states that this smells different. She believes is due to the colchicine which is very likely. There've been no known sick contacts. He denies any nausea or vomiting. Since starting the aspirin and the colchicine, his chest pain is better. However he is becoming weak due to the diarrhea.  At that time, my plan was: I believe the patient's diarrhea is likely due to colchicine. However at the present time it seems to be helping his pericarditis in addition to the  high-dose aspirin. I will check a GI pathogen panel to rule out C. difficile colitis or other intestinal pathogens. If GI pathogen panel is negative, I would blame the diarrhea on the colchicine given the temporal relation.  I have recommended trying Imodium 2 tablets now and 1 tablet with every meal while taking colchicine to slow down the diarrhea. If this is effective, I will continue this until the colchicine has been discontinued or until the patient develops constipation. If this is ineffective, I would recommend discontinuing colchicine and continuing on with high-dose aspirin alone for pericarditis. I will check a CBC as well as a CMP to evaluate for any electrolyte abnormalities stemming from the diarrhea  01/17/17 Since I last saw the patient, he has been back to the hospital complaining of chest pain and fatigue. This was last week.  Chest x-ray showed no significant change. Cardiac enzymes were negative 2. Lab work was only remarkable for low platelet count which is chronic and some mild anemia which is also chronic. Patient has been seen numerous times over the last 2-3 years complaining of feeling weak and tired. He states that his weakness is getting worse. He is barely able to walk to his mailbox and back. Physical therapy at home after his last hospital visit provided him no benefit. It did not improve his strength or his stamina. He denies any fevers or chills. He denies any bleeding  or bruising. He denies any night sweats He does report chronic left-sided chest pain that has been present for several months unchanged. He reports chronic shortness of breath that has been unchanged for several months. He denies any nausea or vomiting. He denies any diarrhea. He denies any melena or hematochezia. Wt Readings from Last 3 Encounters:  01/17/17 173 lb (78.5 kg)  12/30/16 171 lb 1.9 oz (77.6 kg)  12/26/16 174 lb (78.9 kg)   In February, the patient weighed 172 pounds. Therefore his weight is  remaining remarkably stable over the last 6 months, which is reassuring  Past Medical History:  Diagnosis Date  . Adenocarcinoma of right lung ([place]) 01/06/2016  . Anginal chest pain at rest [facility])    Chronic non, controlled on Lorazepam  . Anxiety   . Arthritis    "all over"   . BPH (benign prostatic hyperplasia)   . CAD (coronary artery disease)   . Chronic lower back pain   . Complication of anesthesia    "problems making water afterwards"  . Depression   . DJD (degenerative joint disease)   . Esophageal ulcer without bleeding    bid ppi indefinitely  . Family history of adverse reaction to anesthesia    "children w/PONV"  . GERD (gastroesophageal reflux disease)   . Headache    "couple/week maybe" (09/04/2015)  . Hemochromatosis    Possible, elevated iron stores, hook-like osteophytes on hand films, normal LFTs  . Hepatitis    "yellow jaundice as a baby"  . History of ASCVD    MULTIVESSEL  . Hyperlipidemia   . Hypertension   . Myocardial infarction ([place]) 1999  . Osteoarthritis   . Pneumonia    "several times; got a little now" (09/04/2015)  . Rheumatoid arthritis ([place])   . Subdural hematoma ([facility])    small after fall 09/2016-plavix held, neurosurgery consulted.  Asymptomatic.     Past Surgical History:  Procedure Laterality Date  . ARM SKIN LESION BIOPSY / EXCISION Left 09/02/2015  . CATARACT EXTRACTION W/ INTRAOCULAR LENS  IMPLANT, BILATERAL Bilateral   . CHOLECYSTECTOMY OPEN    . CORNEAL TRANSPLANT Bilateral    "one at [facility]; one at [facility]"  . CORONARY ANGIOPLASTY WITH STENT PLACEMENT    . CORONARY ARTERY BYPASS GRAFT  1999  . DESCENDING AORTIC ANEURYSM REPAIR W/ STENT     DE stent ostium into the right radial free graft at OM-, 02-2007  . ESOPHAGOGASTRODUODENOSCOPY N/A 11/09/2014   Procedure: ESOPHAGOGASTRODUODENOSCOPY (EGD);  Surgeon: [name], MD;  Location: [facility] ENDOSCOPY;  Service: Endoscopy;  Laterality: N/A;  . INGUINAL HERNIA REPAIR    . JOINT REPLACEMENT    .  NASAL SINUS SURGERY    . SHOULDER OPEN ROTATOR CUFF REPAIR Right   . TOTAL KNEE ARTHROPLASTY Bilateral    Current Outpatient Prescriptions on File Prior to Visit  Medication Sig Dispense Refill  . albuterol (VENTOLIN HFA) 108 (90 Base) MCG/ACT inhaler Inhale 1-2 puffs into the lungs every 6 (six) hours as needed for wheezing or shortness of breath. 18 g 2  . aspirin 325 MG EC tablet Take 325 mg by mouth daily.    Marland Kitchen atenolol (TENORMIN) 25 MG tablet Take 25 mg by mouth daily.    . Cholecalciferol 2000 units TABS Take 1 tablet (2,000 Units total) by mouth daily. 30 each 11  . colchicine 0.6 MG tablet Take 1 tablet (0.6 mg total) by mouth daily. (Patient taking differently: Take 0.6 mg by mouth 2 (two) times daily. )  200 tablet 0  . Cyanocobalamin (VITAMIN B-12 PO) Take 1 tablet by mouth daily.    Marland Kitchen FLUoxetine (PROZAC) 20 MG capsule TAKE 20 mg  BY MOUTH DAILY 90 capsule 1  . ipratropium-albuterol (DUONEB) 0.5-2.5 (3) MG/3ML SOLN Take 3 mLs by nebulization every 4 (four) hours as needed (or Chest Pain / Tightness). 360 mL 3  . leflunomide (ARAVA) 10 MG tablet Take 10 mg by mouth daily.     Marland Kitchen Lifitegrast (XIIDRA) 5 % SOLN Place 1-2 drops into both eyes daily.    Marland Kitchen loperamide (IMODIUM A-D) 2 MG tablet Take 2-4 mg by mouth as needed for diarrhea or loose stools.    Marland Kitchen LORazepam (ATIVAN) 1 MG tablet Take 86m by mouth every morning; Take 0.5 mg at lunch; Take 147mat bedtime 90 tablet 0  . nitroGLYCERIN (NITROSTAT) 0.4 MG SL tablet Place 1 tablet (0.4 mg total) under the tongue every 5 (five) minutes as needed for chest pain. 25 tablet 2  . pantoprazole (PROTONIX) 40 MG tablet Take 1 tablet (40 mg total) by mouth daily. 90 tablet 2  . rosuvastatin (CRESTOR) 5 MG tablet TAKE 5 mg  EVERY DAY(NEED TO SCHEDULE YEARLY APPOINTMENT FOR AUGUST ) 90 tablet 3  . saccharomyces boulardii (FLORASTOR) 250 MG capsule Take 250 mg by mouth daily as needed (for stomach).    . Spacer/Aero-Holding Chambers (AEROCHAMBER PLUS  WITH MASK) inhaler Use as instructed 1 each 2  . tamsulosin (FLOMAX) 0.4 MG CAPS capsule TAKE 0.4 gm by mouth EVERY DAY 90 capsule 3   No current facility-administered medications on file prior to visit.    Allergies  Allergen Reactions  . Feldene [Piroxicam] Other (See Comments)    REACTION: Blisters  . Statins Other (See Comments)    Myalgias  . Doxycycline Other (See Comments)    REACTION:  unknown  . Latex Rash  . Tequin Anxiety  . Valium Other (See Comments)    REACTION:  unknown   Social History   Social History  . Marital status: Widowed    Spouse name: N/A  . Number of children: N/A  . Years of education: N/A   Occupational History  . Not on file.   Social History Main Topics  . Smoking status: Never Smoker  . Smokeless tobacco: Former [employer] developer  Types: Chew     Comment: "quit chewing in the 1960s"  . Alcohol use No  . Drug use: No  . Sexual activity: No   Other Topics Concern  . Not on file   Social History Narrative  . No narrative on file    Review of Systems  Cardiovascular: Positive for chest pain.  All other systems reviewed and are negative.      Objective:   Physical Exam  Constitutional: He is oriented to person, place, and time. He appears well-developed and well-nourished. No distress.  HENT:  Right Ear: External ear normal.  Left Ear: External ear normal.  Nose: Nose normal.  Mouth/Throat: Oropharynx is clear and moist. No oropharyngeal exudate.  Eyes: Pupils are equal, round, and reactive to light. Conjunctivae are normal.  Neck: Neck supple. No JVD present. No tracheal deviation present. No thyromegaly present.  Cardiovascular: Normal rate, regular rhythm and normal heart sounds.   No murmur heard. Pulmonary/Chest: Effort normal and breath sounds normal. No stridor. No respiratory distress. He has no wheezes. He has no rales.  Abdominal: Soft. Bowel sounds are normal. He exhibits no distension and no mass. There is no  tenderness.  There is no rebound and no guarding.  Musculoskeletal: He exhibits no edema.  Lymphadenopathy:    He has no cervical adenopathy.  Neurological: He is alert and oriented to person, place, and time. He has normal reflexes. He displays normal reflexes. No cranial nerve deficit. He exhibits normal muscle tone. Coordination normal.  Skin: He is not diaphoretic.  Vitals reviewed.         Assessment & Plan:  Chronic fatigue - Plan: Protein electrophoresis, serum, CBC with Differential/Platelet, COMPLETE METABOLIC PANEL WITH GFR, Fecal occult blood, imunochemical, Fecal occult blood, imunochemical, Fecal occult blood, imunochemical, TSH, Vitamin B12, Testosterone Total,Free,Bio, Males, Multiple Myeloma Panel (SPEP&IFE w/QIG)  Patient is concerned that "we are missing something".  He states that something is wrong and no one can find it. However retrospective analysis of the chart shows similar symptoms for several years. Furthermore the patient's 83 years old. I explained to the patient that some of the fatigue and weakness could simply be the result of age and his multiple medical comorbidities. This may be something that he has to learn to live with. I will certainly exhaust my efforts to rule out any other underlying pathology. Therefore I will check a serum protein electrophoresis to evaluate for multiple myeloma. I will check a CBC to look for worsening anemia. I will check a CMP to look for kidney failure or liver failure. I will check fecal occult blood cards 3 to evaluate for occult GI malignancy. I will check a TSH to evaluate for hyperthyroidism. I will check a vitamin B12 to evaluate for B12 deficiency. I will check a total testosterone to evaluate for hypogonadism. If the above lab work is normal, I will exhausted everything I can think to check and I will have done all I know to do.

## 2017-01-17 NOTE — Telephone Encounter (Signed)
rx called into pharmacy

## 2017-01-19 LAB — CBC WITH DIFFERENTIAL/PLATELET
Basophils Absolute: 28 cells/uL (ref 0–200)
Basophils Relative: 0.5 %
Eosinophils Absolute: 149 cells/uL (ref 15–500)
Eosinophils Relative: 2.7 %
HCT: 36.8 % — ABNORMAL LOW (ref 38.5–50.0)
Hemoglobin: 12.4 g/dL — ABNORMAL LOW (ref 13.2–17.1)
Lymphs Abs: 792 cells/uL — ABNORMAL LOW (ref 850–3900)
MCH: 31 pg (ref 27.0–33.0)
MCHC: 33.7 g/dL (ref 32.0–36.0)
MCV: 92 fL (ref 80.0–100.0)
MPV: 13.1 fL — ABNORMAL HIGH (ref 7.5–12.5)
Monocytes Relative: 15.3 %
Neutro Abs: 3691 cells/uL (ref 1500–7800)
Neutrophils Relative %: 67.1 %
Platelets: 99 10*3/uL — ABNORMAL LOW (ref 140–400)
RBC: 4 10*6/uL — ABNORMAL LOW (ref 4.20–5.80)
RDW: 13 % (ref 11.0–15.0)
Total Lymphocyte: 14.4 %
WBC mixed population: 842 cells/uL (ref 200–950)
WBC: 5.5 10*3/uL (ref 3.8–10.8)

## 2017-01-19 LAB — TSH: TSH: 1.61 mIU/L (ref 0.40–4.50)

## 2017-01-19 LAB — COMPLETE METABOLIC PANEL WITH GFR
AG Ratio: 1.7 (calc) (ref 1.0–2.5)
ALT: 12 U/L (ref 9–46)
AST: 19 U/L (ref 10–35)
Albumin: 4 g/dL (ref 3.6–5.1)
Alkaline phosphatase (APISO): 141 U/L — ABNORMAL HIGH (ref 40–115)
BUN/Creatinine Ratio: 16 (calc) (ref 6–22)
BUN: 19 mg/dL (ref 7–25)
CO2: 20 mmol/L (ref 20–32)
Calcium: 9.1 mg/dL (ref 8.6–10.3)
Chloride: 106 mmol/L (ref 98–110)
Creat: 1.19 mg/dL — ABNORMAL HIGH (ref 0.70–1.11)
GFR, Est African American: 63 mL/min/{1.73_m2} (ref >=60)
GFR, Est Non African American: 55 mL/min/{1.73_m2} — ABNORMAL LOW (ref >=60)
Globulin: 2.4 g/dL (calc) (ref 1.9–3.7)
Glucose, Bld: 119 mg/dL — ABNORMAL HIGH (ref 65–99)
Potassium: 4.3 mmol/L (ref 3.5–5.3)
Sodium: 137 mmol/L (ref 135–146)
Total Bilirubin: 0.9 mg/dL (ref 0.2–1.2)
Total Protein: 6.4 g/dL (ref 6.1–8.1)

## 2017-01-19 LAB — PROTEIN ELECTROPHORESIS, SERUM
Albumin ELP: 3.8 g/dL (ref 3.8–4.8)
Alpha 1: 0.4 g/dL — ABNORMAL HIGH (ref 0.2–0.3)
Alpha 2: 0.9 g/dL (ref 0.5–0.9)
Beta 2: 0.3 g/dL (ref 0.2–0.5)
Beta Globulin: 0.4 g/dL (ref 0.4–0.6)
Gamma Globulin: 0.7 g/dL — ABNORMAL LOW (ref 0.8–1.7)
Total Protein: 6.5 g/dL (ref 6.1–8.1)

## 2017-01-19 LAB — TESTOSTERONE TOTAL,FREE,BIO, MALES
Albumin: 4 g/dL (ref 3.6–5.1)
Sex Hormone Binding: 111 nmol/L — ABNORMAL HIGH (ref 22–77)
Testosterone, Bioavailable: 29.3 ng/dL (ref 15.0–150.0)
Testosterone, Free: 16 pg/mL (ref 6.0–73.0)
Testosterone: 365 ng/dL (ref 250–827)

## 2017-01-19 LAB — VITAMIN B12: Vitamin B-12: 1053 pg/mL (ref 200–1100)

## 2017-01-21 ENCOUNTER — Other Ambulatory Visit: Payer: Self-pay | Admitting: Family Medicine

## 2017-01-30 NOTE — Progress Notes (Signed)
Cardiology Office Note   Date:  01/31/2017   ID:  Evan [name], DOB 1927/08/31, MRN [id number]  PCP:  [name], MD    No chief complaint on file. CAD   Wt Readings from Last 3 Encounters:  01/31/17 177 lb 6.4 oz (80.5 kg)  01/17/17 173 lb (78.5 kg)  12/30/16 171 lb 1.9 oz (77.6 kg)       History of Present Illness: Evan [name] is a 83 y.o. male  with a history of coronary artery disease and two-vessel CABG in 45 and cath in 2009 which showed patent stents and grafts. Unfortunately had lung cancer and received radiation therapy.  He was diagnosed with pericarditis.  Hospitalized in 9/18: 2D echo showed normal LVEF and mild AS. He was noted to be in afib with spontaneous conversion during hospitalization. His chest pain was felt likely secondary to acute pericarditis and he was placed on high dose ASA and colchicine. This resulted in improvement of symptoms. Echo was negative for pericardial effusion.  Given his afib, decision was made to hold off on initiation of a/c given need to be on high dose ASA given pericarditis. Dr. Debara Pickett recommended 2 weeks of high dose ASA. In addition, he was seen by pulmonology who did not suspect worsening lung CA nor PNA.   Recs per Dr. Debara Pickett: Will continue aspirin TID for 2 weeks stop and then colchicine for 3 months. Pt was discharged on 9/21. He presents back to clinic for post hospital. He feels ok, except he has had some diarrhea with colchicine, which has improved with imodium.   CP is persisting.  Worse in the morning.  Better as the day goes on.  SOme relief with Tylenol.  He feels no difference in the pain with walking.   Ability to walk has improved.    Past Medical History:  Diagnosis Date  . Adenocarcinoma of right lung ([place]) 01/06/2016  . Anginal chest pain at rest [facility])    Chronic non, controlled on Lorazepam  . Anxiety   . Arthritis    "all over"   . BPH (benign prostatic hyperplasia)   . CAD (coronary artery  disease)   . Chronic lower back pain   . Complication of anesthesia    "problems making water afterwards"  . Depression   . DJD (degenerative joint disease)   . Esophageal ulcer without bleeding    bid ppi indefinitely  . Family history of adverse reaction to anesthesia    "children w/PONV"  . GERD (gastroesophageal reflux disease)   . Headache    "couple/week maybe" (09/04/2015)  . Hemochromatosis    Possible, elevated iron stores, hook-like osteophytes on hand films, normal LFTs  . Hepatitis    "yellow jaundice as a baby"  . History of ASCVD    MULTIVESSEL  . Hyperlipidemia   . Hypertension   . Myocardial infarction ([place]) 1999  . Osteoarthritis   . Pneumonia    "several times; got a little now" (09/04/2015)  . Rheumatoid arthritis ([place])   . Subdural hematoma ([facility])    small after fall 09/2016-plavix held, neurosurgery consulted.  Asymptomatic.      Past Surgical History:  Procedure Laterality Date  . ARM SKIN LESION BIOPSY / EXCISION Left 09/02/2015  . CATARACT EXTRACTION W/ INTRAOCULAR LENS  IMPLANT, BILATERAL Bilateral   . CHOLECYSTECTOMY OPEN    . CORNEAL TRANSPLANT Bilateral    "one at [facility]; one at [facility]"  . CORONARY ANGIOPLASTY WITH STENT PLACEMENT    .  CORONARY ARTERY BYPASS GRAFT  1999  . DESCENDING AORTIC ANEURYSM REPAIR W/ STENT     DE stent ostium into the right radial free graft at OM-, 02-2007  . ESOPHAGOGASTRODUODENOSCOPY N/A 11/09/2014   Procedure: ESOPHAGOGASTRODUODENOSCOPY (EGD);  Surgeon: [name], MD;  Location: [facility] ENDOSCOPY;  Service: Endoscopy;  Laterality: N/A;  . INGUINAL HERNIA REPAIR    . JOINT REPLACEMENT    . NASAL SINUS SURGERY    . SHOULDER OPEN ROTATOR CUFF REPAIR Right   . TOTAL KNEE ARTHROPLASTY Bilateral      Current Outpatient Prescriptions  Medication Sig Dispense Refill  . alendronate (FOSAMAX) 70 MG tablet Take 70 mg by mouth daily.    Marland Kitchen aspirin 81 MG tablet Take 81 mg by mouth daily.     Marland Kitchen atenolol (TENORMIN) 25 MG tablet Take 25  mg by mouth daily.    . Cholecalciferol 2000 units TABS Take 1 tablet (2,000 Units total) by mouth daily. 30 each 11  . colchicine 0.6 MG tablet Take 0.6 mg by mouth daily.    . Cyanocobalamin (VITAMIN B-12 PO) Take 1 tablet by mouth daily.    Marland Kitchen FLUoxetine (PROZAC) 20 MG capsule TAKE 20 mg  BY MOUTH DAILY 90 capsule 1  . FLUoxetine (PROZAC) 20 MG capsule TAKE ONE CAPSULE BY MOUTH DAILY 90 capsule 3  . Fluticasone-Salmeterol (ADVAIR DISKUS) 100-50 MCG/DOSE AEPB Take 30 mg by mouth daily.    Marland Kitchen ipratropium-albuterol (DUONEB) 0.5-2.5 (3) MG/3ML SOLN Take 3 mLs by nebulization every 4 (four) hours as needed (or Chest Pain / Tightness). 360 mL 3  . leflunomide (ARAVA) 10 MG tablet Take 10 mg by mouth daily.     Marland Kitchen Lifitegrast (XIIDRA) 5 % SOLN Place 1-2 drops into both eyes daily.    Marland Kitchen loperamide (IMODIUM A-D) 2 MG tablet Take 2-4 mg by mouth as needed for diarrhea or loose stools.    Marland Kitchen LORazepam (ATIVAN) 1 MG tablet TAKE ONE (1) TABLET BY MOUTH 3 TIMES DAILY 90 tablet 0  . nitroGLYCERIN (NITROSTAT) 0.4 MG SL tablet Place 1 tablet (0.4 mg total) under the tongue every 5 (five) minutes as needed for chest pain. 25 tablet 2  . pantoprazole (PROTONIX) 40 MG tablet Take 1 tablet (40 mg total) by mouth daily. 90 tablet 2  . rosuvastatin (CRESTOR) 5 MG tablet TAKE 5 mg  EVERY DAY(NEED TO SCHEDULE YEARLY APPOINTMENT FOR AUGUST ) 90 tablet 3  . saccharomyces boulardii (FLORASTOR) 250 MG capsule Take 250 mg by mouth daily as needed (for stomach).    . Spacer/Aero-Holding Chambers (AEROCHAMBER PLUS WITH MASK) inhaler Use as instructed 1 each 2  . tamsulosin (FLOMAX) 0.4 MG CAPS capsule TAKE 0.4 gm by mouth EVERY DAY 90 capsule 3   No current facility-administered medications for this visit.     Allergies:   Feldene [piroxicam]; Statins; Doxycycline; Latex; Tequin; and Valium    Social History:  The patient  reports that he has never smoked. He has quit using smokeless tobacco. His smokeless tobacco use  included Chew. He reports that he does not drink alcohol or use drugs.   Family History:  The patient's family history includes Arthritis-Osteo in his sister; Heart attack in his brother and other.    ROS:  Please see the history of present illness.   Otherwise, review of systems are positive for chest pain related to pericarditis.   All other systems are reviewed and negative.    PHYSICAL EXAM: VS:  BP 128/66   Pulse Marland Kitchen)  104   Ht 5\' 1"  (1.549 m)   Wt 177 lb 6.4 oz (80.5 kg)   SpO2 95%   BMI 33.52 kg/m  , BMI Body mass index is 33.52 kg/m. GEN: Well nourished, well developed, in no acute distress , frail HEENT: normal  Neck: no JVD, carotid bruits, or masses Cardiac: irregularly irregular; no murmurs, rubs, or gallops,no edema  Respiratory:  clear to auscultation bilaterally, normal work of breathing GI: soft, nontender, nondistended, + BS MS: no deformity or atrophy  Skin: warm and dry, no rash Neuro:  Strength and sensation are intact Psych: euthymic mood, full affect    Recent Labs: 05/20/2016: B Natriuretic Peptide 585.3 12/20/2016: Magnesium 1.9 01/17/2017: ALT 12; BUN 19; Creat 1.19; Hemoglobin 12.4; Platelets 99; Potassium 4.3; Sodium 137; TSH 1.61   Lipid Panel    Component Value Date/Time   CHOL 113 12/21/2016 0211   CHOL 97 09/09/2013 0957   TRIG 98 12/21/2016 0211   TRIG 92 09/09/2013 0957   HDL 28 (L) 12/21/2016 0211   HDL 37 (L) 09/09/2013 0957   CHOLHDL 4.0 12/21/2016 0211   VLDL 20 12/21/2016 0211   LDLCALC 65 12/21/2016 0211   LDLCALC 42 09/09/2013 0957     Other studies Reviewed: Additional studies/ records that were reviewed today with results demonstrating: Recent echo reviewed.   ASSESSMENT AND PLAN:  1. CAD: No angina.  He was on Plavix in the past.  This was stopped for aspirin in the setting of pericarditis. 2. Pericarditis: Still having pain.  He can use NSAIDs in the morning with breakfast to once every few days.  3. Lung cancer:  Followed by oncology. 4. PAF: Appears to be back in AFib. Rate control with atenolol.  We discussed longterm anticoagulation.  He is not a falls risk.  He has not had prior bleeding per his report.  He would be a candidate for long-term anticoagulation.  I verify the dose with the pharmacy.  We will plan for Eliquis 5 mg twice daily.  If there are any bleeding problems, we will have to reconsider.  I also counseled him about limiting his NSAID use to once every few days, to minimize the risk of bleeding.  He is on a proton pump inhibitor. This patients CHA2DS2-VASc Score and unadjusted Ischemic Stroke Rate (% per year) is equal to 4.8 % stroke rate/year from a score of 4  Above score calculated as 1 point each if present [CHF, HTN, DM, Vascular=MI/PAD/Aortic Plaque, Age if 65-74, or Male] Above score calculated as 2 points each if present [Age > 75, or Stroke/TIA/TE]  5.    Current medicines are reviewed at length with the patient today.  The patient concerns regarding his medicines were addressed.  The following changes have been made:  Start Eliquis  Labs/ tests ordered today include:  No orders of the defined types were placed in this encounter.   Recommend 150 minutes/week of aerobic exercise Low fat, low carb, high fiber diet recommended  Disposition:   FU in 3-4 weeks   Signed, Larae Grooms, MD  01/31/2017 10:33 AM    [facility] [address], [place], [place]  [address] Phone: [phone]; Fax: [phone]

## 2017-01-31 ENCOUNTER — Encounter: Payer: Self-pay | Admitting: Interventional Cardiology

## 2017-01-31 ENCOUNTER — Ambulatory Visit (INDEPENDENT_AMBULATORY_CARE_PROVIDER_SITE_OTHER): Payer: Medicare HMO | Admitting: Interventional Cardiology

## 2017-01-31 VITALS — BP 128/66 | HR 104 | Ht 61.0 in | Wt 177.4 lb

## 2017-01-31 DIAGNOSIS — I25118 Atherosclerotic heart disease of native coronary artery with other forms of angina pectoris: Secondary | ICD-10-CM | POA: Diagnosis not present

## 2017-01-31 DIAGNOSIS — I480 Paroxysmal atrial fibrillation: Secondary | ICD-10-CM

## 2017-01-31 DIAGNOSIS — I309 Acute pericarditis, unspecified: Secondary | ICD-10-CM

## 2017-01-31 MED ORDER — APIXABAN 5 MG PO TABS: 5.0000 mg | ORAL_TABLET | Freq: Two times a day (BID) | ORAL | 11 refills | Status: DC

## 2017-01-31 NOTE — Patient Instructions (Signed)
Medication Instructions:  Your physician has recommended you make the following change in your medication:   1. STOP: Aspirin  2. START: Eliquis 5 mg twice a day  Labwork: None ordered  Testing/Procedures: None ordered  Follow-Up: Your physician recommends that you schedule a follow-up appointment in: 2-3 weeks with Dr. Irish Lack or APP on his team    Any Other Special Instructions Will Be Listed Below (If Applicable).     If you need a refill on your cardiac medications before your next appointment, please call your pharmacy.

## 2017-02-04 DIAGNOSIS — J449 Chronic obstructive pulmonary disease, unspecified: Secondary | ICD-10-CM | POA: Diagnosis not present

## 2017-02-04 DIAGNOSIS — R269 Unspecified abnormalities of gait and mobility: Secondary | ICD-10-CM | POA: Diagnosis not present

## 2017-02-04 DIAGNOSIS — J4520 Mild intermittent asthma, uncomplicated: Secondary | ICD-10-CM | POA: Diagnosis not present

## 2017-02-04 DIAGNOSIS — R0602 Shortness of breath: Secondary | ICD-10-CM | POA: Diagnosis not present

## 2017-02-04 DIAGNOSIS — R296 Repeated falls: Secondary | ICD-10-CM | POA: Diagnosis not present

## 2017-02-10 ENCOUNTER — Ambulatory Visit: Payer: Medicare HMO | Admitting: Family Medicine

## 2017-02-10 VITALS — BP 136/74 | HR 72 | Temp 98.2°F | Resp 18 | Ht 68.0 in | Wt 174.0 lb

## 2017-02-10 DIAGNOSIS — L299 Pruritus, unspecified: Secondary | ICD-10-CM | POA: Diagnosis not present

## 2017-02-10 DIAGNOSIS — L304 Erythema intertrigo: Secondary | ICD-10-CM

## 2017-02-10 MED ORDER — CLOTRIMAZOLE-BETAMETHASONE 1-0.05 % EX CREA: 1.0000 "application " | TOPICAL_CREAM | Freq: Two times a day (BID) | CUTANEOUS | 0 refills | Status: DC

## 2017-02-10 NOTE — Progress Notes (Signed)
Subjective:    Patient ID: Evan [name], male    DOB: [birth year], 83 y.o.   MRN: [id number]  Chest Pain    Patient presents today complaining of itching on his torso, itching on his right flank, itching in the center of his back.  There is no visible rash.  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  Itching tends to intensify during periods of stress and his son was recently released from prison 2 weeks ago.  Rash began or rather the itching began approximately 1 week ago.  He also has a rash in his perineum between his scrotum and his upper right thigh.  It is erythematous.  It appears to be intertrigo.  There is also some redness on the scrotum. Past Medical History:  Diagnosis Date  . Adenocarcinoma of right lung ([place]) 01/06/2016  . Anginal chest pain at rest [facility])    Chronic non, controlled on Lorazepam  . Anxiety   . Arthritis    "all over"   . BPH (benign prostatic hyperplasia)   . CAD (coronary artery disease)   . Chronic lower back pain   . Complication of anesthesia    "problems making water afterwards"  . Depression   . DJD (degenerative joint disease)   . Esophageal ulcer without bleeding    bid ppi indefinitely  . Family history of adverse reaction to anesthesia    "children w/PONV"  . GERD (gastroesophageal reflux disease)   . Headache    "couple/week maybe" (09/04/2015)  . Hemochromatosis    Possible, elevated iron stores, hook-like osteophytes on hand films, normal LFTs  . Hepatitis    "yellow jaundice as a baby"  . History of ASCVD    MULTIVESSEL  . Hyperlipidemia   . Hypertension   . Myocardial infarction ([place]) 1999  . Osteoarthritis   . Pneumonia    "several times; got a little now" (09/04/2015)  . Rheumatoid arthritis ([place])   . Subdural hematoma ([facility])    small after fall 09/2016-plavix held, neurosurgery consulted.  Asymptomatic.     Past Surgical History:  Procedure Laterality Date  .  ARM SKIN LESION BIOPSY / EXCISION Left 09/02/2015  . CATARACT EXTRACTION W/ INTRAOCULAR LENS  IMPLANT, BILATERAL Bilateral   . CHOLECYSTECTOMY OPEN    . CORNEAL TRANSPLANT Bilateral    "one at [facility]; one at [facility]"  . CORONARY ANGIOPLASTY WITH STENT PLACEMENT    . CORONARY ARTERY BYPASS GRAFT  1999  . DESCENDING AORTIC ANEURYSM REPAIR W/ STENT     DE stent ostium into the right radial free graft at OM-, 02-2007  . INGUINAL HERNIA REPAIR    . JOINT REPLACEMENT    . NASAL SINUS SURGERY    . SHOULDER OPEN ROTATOR CUFF REPAIR Right   . TOTAL KNEE ARTHROPLASTY Bilateral    Current Outpatient Medications on File Prior to Visit  Medication Sig Dispense Refill  . alendronate (FOSAMAX) 70 MG tablet Take 70 mg by mouth daily.    Marland Kitchen apixaban (ELIQUIS) 5 MG TABS tablet Take 1 tablet (5 mg total) by mouth 2 (two) times daily. 60 tablet 11  . atenolol (TENORMIN) 25 MG tablet Take 25 mg by mouth daily.    . Cholecalciferol 2000 units TABS Take 1 tablet (2,000 Units total) by mouth daily. 30 each 11  . colchicine 0.6 MG tablet Take 0.6 mg by mouth daily.    . Cyanocobalamin (VITAMIN B-12 PO) Take 1 tablet by  mouth daily.    Marland Kitchen FLUoxetine (PROZAC) 20 MG capsule TAKE 20 mg  BY MOUTH DAILY 90 capsule 1  . FLUoxetine (PROZAC) 20 MG capsule TAKE ONE CAPSULE BY MOUTH DAILY 90 capsule 3  . Fluticasone-Salmeterol (ADVAIR DISKUS) 100-50 MCG/DOSE AEPB Take 30 mg by mouth daily.    Marland Kitchen ipratropium-albuterol (DUONEB) 0.5-2.5 (3) MG/3ML SOLN Take 3 mLs by nebulization every 4 (four) hours as needed (or Chest Pain / Tightness). 360 mL 3  . leflunomide (ARAVA) 10 MG tablet Take 10 mg by mouth daily.     Marland Kitchen Lifitegrast (XIIDRA) 5 % SOLN Place 1-2 drops into both eyes daily.    Marland Kitchen loperamide (IMODIUM A-D) 2 MG tablet Take 2-4 mg by mouth as needed for diarrhea or loose stools.    Marland Kitchen LORazepam (ATIVAN) 1 MG tablet TAKE ONE (1) TABLET BY MOUTH 3 TIMES DAILY 90 tablet 0  . nitroGLYCERIN (NITROSTAT) 0.4 MG SL tablet Place 1 tablet  (0.4 mg total) under the tongue every 5 (five) minutes as needed for chest pain. 25 tablet 2  . pantoprazole (PROTONIX) 40 MG tablet Take 1 tablet (40 mg total) by mouth daily. 90 tablet 2  . rosuvastatin (CRESTOR) 5 MG tablet TAKE 5 mg  EVERY DAY(NEED TO SCHEDULE YEARLY APPOINTMENT FOR AUGUST ) 90 tablet 3  . saccharomyces boulardii (FLORASTOR) 250 MG capsule Take 250 mg by mouth daily as needed (for stomach).    . Spacer/Aero-Holding Chambers (AEROCHAMBER PLUS WITH MASK) inhaler Use as instructed 1 each 2  . tamsulosin (FLOMAX) 0.4 MG CAPS capsule TAKE 0.4 gm by mouth EVERY DAY 90 capsule 3   No current facility-administered medications on file prior to visit.    Allergies  Allergen Reactions  . Feldene [Piroxicam] Other (See Comments)    REACTION: Blisters  . Statins Other (See Comments)    Myalgias  . Doxycycline Other (See Comments)    REACTION:  unknown  . Latex Rash  . Tequin Anxiety  . Valium Other (See Comments)    REACTION:  unknown   Social History   Socioeconomic History  . Marital status: Widowed    Spouse name: Not on file  . Number of children: Not on file  . Years of education: Not on file  . Highest education level: Not on file  Social Needs  . Financial resource strain: Not on file  . Food insecurity - worry: Not on file  . Food insecurity - inability: Not on file  . Transportation needs - medical: Not on file  . Transportation needs - non-medical: Not on file  Occupational History  . Not on file  Tobacco Use  . Smoking status: Never Smoker  . Smokeless tobacco: Former Systems developer    Types: Chew  . Tobacco comment: "quit chewing in the 1960s"  Substance and Sexual Activity  . Alcohol use: No  . Drug use: No  . Sexual activity: No  Other Topics Concern  . Not on file  Social History Narrative  . Not on file    Review of Systems  Cardiovascular: Positive for chest pain.  All other systems reviewed and are negative.      Objective:   Physical Exam    Constitutional: He is oriented to person, place, and time. He appears well-developed and well-nourished. No distress.  HENT:  Right Ear: External ear normal.  Left Ear: External ear normal.  Nose: Nose normal.  Mouth/Throat: Oropharynx is clear and moist. No oropharyngeal exudate.  Eyes: Conjunctivae are normal. Pupils  are equal, round, and reactive to light.  Cardiovascular: Normal rate, regular rhythm and normal heart sounds.  No murmur heard. Pulmonary/Chest: Effort normal and breath sounds normal. No respiratory distress. He has no wheezes. He has no rales.  Abdominal: Soft. Bowel sounds are normal. He exhibits no distension and no mass. There is no tenderness. There is no rebound and no guarding.  Neurological: He is alert and oriented to person, place, and time. He has normal reflexes. He exhibits normal muscle tone.  Skin: Rash noted. He is not diaphoretic.     Vitals reviewed.         Assessment & Plan:  Intertrigo Neurogenic itching Rash in his perineum appears to be intertrigo.  I would treat this with Lotrisone cream twice daily for 10 days.  The itching on his trunk and on his back with no visible rash represents notalgia paresthetica or neurogenic pruritus.  I believe this is related to anxiety.  I explained this to the patient.  Exacerbations occur during periods of tremendous stress.  His son has recently been released from prison which I believe is exacerbated the situation.

## 2017-02-20 ENCOUNTER — Other Ambulatory Visit: Payer: Self-pay | Admitting: Interventional Cardiology

## 2017-02-20 ENCOUNTER — Encounter: Payer: Self-pay | Admitting: Physician Assistant

## 2017-02-20 ENCOUNTER — Other Ambulatory Visit: Payer: Self-pay | Admitting: Family Medicine

## 2017-02-20 ENCOUNTER — Ambulatory Visit: Payer: Medicare HMO | Admitting: Physician Assistant

## 2017-02-20 VITALS — BP 122/86 | HR 54 | Ht 65.0 in | Wt 171.1 lb

## 2017-02-20 DIAGNOSIS — I10 Essential (primary) hypertension: Secondary | ICD-10-CM | POA: Diagnosis not present

## 2017-02-20 DIAGNOSIS — I309 Acute pericarditis, unspecified: Secondary | ICD-10-CM | POA: Diagnosis not present

## 2017-02-20 DIAGNOSIS — Z951 Presence of aortocoronary bypass graft: Secondary | ICD-10-CM

## 2017-02-20 DIAGNOSIS — I480 Paroxysmal atrial fibrillation: Secondary | ICD-10-CM | POA: Diagnosis not present

## 2017-02-20 NOTE — Progress Notes (Signed)
Cardiology Office Note    Date:  02/20/2017   ID:  Evan [name], DOB [birth year], MRN [id number]  PCP:  [name], MD  Cardiologist: Dr. Irish Lack  Chief Complaint  Patient presents with  . Follow-up    CAD/Seen for Dr. Irish Lack  . Atrial Flutter    History of Present Illness:  Evan [name] is a 83 y.o. male with history of CAD status post CABG x2 in 1998 and cath in 2009 showed patent stents and grafts.  Low risk nuclear stress test 2017 he also has history of lung cancer status post radiation therapy.  Hospitalization 12/2016 with pericarditis.  2D echo showed normal LVEF with mild AS, no pericardial effusion.  He was noted to be in atrial fibrillation with spontaneous conversion to normal sinus rhythm.  He was placed on high-dose aspirin and colchicine.  He was not started on NOAC's because of the need to be on high-dose aspirin with pericarditis.  Plavix was stopped.  He was still having chest pain when he saw Dr. Irish Lack 01/31/17.  He was told he could use NSAIDs in the morning with breakfast every couple of days.  He was back in atrial fibrillation rate controlled.  He was started on Eliquis 5 mg twice daily for CHA2DS2-VASc score of 4.   Patient comes in today for follow-up.  He denies any further chest pain.  He does complain of dyspnea on exertion with little activity and had to stop on his way into the office today to catch his breath.  He also complains of fluttering in his chest when he is walking.  He feels like it is going fast.  Heart rate today is 54 bpm in atrial flutter.   Past Medical History:  Diagnosis Date  . Adenocarcinoma of right lung ([place]) 01/06/2016  . Anginal chest pain at rest [facility])    Chronic non, controlled on Lorazepam  . Anxiety   . Arthritis    "all over"   . BPH (benign prostatic hyperplasia)   . CAD (coronary artery disease)   . Chronic lower back pain   . Complication of anesthesia    "problems making water afterwards"  . Depression     . DJD (degenerative joint disease)   . Esophageal ulcer without bleeding    bid ppi indefinitely  . Family history of adverse reaction to anesthesia    "children w/PONV"  . GERD (gastroesophageal reflux disease)   . Headache    "couple/week maybe" (09/04/2015)  . Hemochromatosis    Possible, elevated iron stores, hook-like osteophytes on hand films, normal LFTs  . Hepatitis    "yellow jaundice as a baby"  . History of ASCVD    MULTIVESSEL  . Hyperlipidemia   . Hypertension   . Myocardial infarction ([place]) 1999  . Osteoarthritis   . Pneumonia    "several times; got a little now" (09/04/2015)  . Rheumatoid arthritis ([place])   . Subdural hematoma (HCC)    small after fall 09/2016-plavix held, neurosurgery consulted.  Asymptomatic.      Past Surgical History:  Procedure Laterality Date  . ARM SKIN LESION BIOPSY / EXCISION Left 09/02/2015  . CATARACT EXTRACTION W/ INTRAOCULAR LENS  IMPLANT, BILATERAL Bilateral   . CHOLECYSTECTOMY OPEN    . CORNEAL TRANSPLANT Bilateral    "one at [facility]; one at [facility]"  . CORONARY ANGIOPLASTY WITH STENT PLACEMENT    . CORONARY ARTERY BYPASS GRAFT  1999  . DESCENDING AORTIC ANEURYSM REPAIR W/ STENT  DE stent ostium into the right radial free graft at OM-, 02-2007  . ESOPHAGOGASTRODUODENOSCOPY (EGD) N/A 11/09/2014   Performed by [name], MD at [place]  . INGUINAL HERNIA REPAIR    . JOINT REPLACEMENT    . NASAL SINUS SURGERY    . SHOULDER OPEN ROTATOR CUFF REPAIR Right   . TOTAL KNEE ARTHROPLASTY Bilateral     Current Medications: Current Meds  Medication Sig  . apixaban (ELIQUIS) 5 MG TABS tablet Take 1 tablet (5 mg total) by mouth 2 (two) times daily.  Marland Kitchen atenolol (TENORMIN) 25 MG tablet Take 25 mg by mouth daily.  . Cholecalciferol 2000 units TABS Take 1 tablet (2,000 Units total) by mouth daily.  . colchicine 0.6 MG tablet Take 0.6 mg by mouth daily.  . Cyanocobalamin (VITAMIN B-12 PO) Take 1 tablet by mouth daily.  Marland Kitchen FLUoxetine (PROZAC)  20 MG capsule TAKE 20 mg  BY MOUTH DAILY  . Fluticasone-Salmeterol (ADVAIR DISKUS) 100-50 MCG/DOSE AEPB Take 30 mg by mouth daily.  Marland Kitchen ipratropium-albuterol (DUONEB) 0.5-2.5 (3) MG/3ML SOLN Take 3 mLs by nebulization every 4 (four) hours as needed (or Chest Pain / Tightness).  Marland Kitchen Lifitegrast (XIIDRA) 5 % SOLN Place 1-2 drops into both eyes daily.  Marland Kitchen LORazepam (ATIVAN) 1 MG tablet TAKE ONE (1) TABLET BY MOUTH 3 TIMES DAILY  . nitroGLYCERIN (NITROSTAT) 0.4 MG SL tablet Place 1 tablet (0.4 mg total) under the tongue every 5 (five) minutes as needed for chest pain.  . pantoprazole (PROTONIX) 40 MG tablet Take 1 tablet (40 mg total) by mouth daily.  . rosuvastatin (CRESTOR) 5 MG tablet TAKE 5 mg  EVERY DAY(NEED TO SCHEDULE YEARLY APPOINTMENT FOR AUGUST )  . Spacer/Aero-Holding Chambers (AEROCHAMBER PLUS WITH MASK) inhaler Use as instructed  . tamsulosin (FLOMAX) 0.4 MG CAPS capsule TAKE 0.4 gm by mouth EVERY DAY     Allergies:   Feldene [piroxicam]; Statins; Doxycycline; Latex; Tequin; and Valium   Social History   Socioeconomic History  . Marital status: Widowed    Spouse name: None  . Number of children: None  . Years of education: None  . Highest education level: None  Social Needs  . Financial resource strain: None  . Food insecurity - worry: None  . Food insecurity - inability: None  . Transportation needs - medical: None  . Transportation needs - non-medical: None  Occupational History  . None  Tobacco Use  . Smoking status: Never Smoker  . Smokeless tobacco: Former Systems developer    Types: Chew  . Tobacco comment: "quit chewing in the 1960s"  Substance and Sexual Activity  . Alcohol use: No  . Drug use: No  . Sexual activity: No  Other Topics Concern  . None  Social History Narrative  . None     Family History:  The patient's family history includes Arthritis-Osteo in his sister; Heart attack in his brother and other.   ROS:   Please see the history of present illness.      Review of Systems  Constitution: Negative.  HENT: Negative.   Cardiovascular: Positive for dyspnea on exertion and orthopnea.  Respiratory: Negative.   Endocrine: Negative.   Hematologic/Lymphatic: Negative.   Musculoskeletal: Negative.   Gastrointestinal: Negative.   Genitourinary: Negative.   Neurological: Negative.    All other systems reviewed and are negative.   PHYSICAL EXAM:   VS:  BP 122/86   Pulse (!) 54   Ht 5\' 5"  (1.651 m)   Wt 171 lb 1.9  oz (77.6 kg)   BMI 28.48 kg/m   Physical Exam  GEN: Well nourished, well developed, elderly, in no acute distress  Neck: no JVD, carotid bruits, or masses Cardiac:RRR; 2/6 systolic murmur at the left sternal border Respiratory: Decreased breath sounds but clear  GI: soft, nontender, nondistended, + BS Ext: without cyanosis, clubbing, or edema, Good distal pulses bilaterally Neuro:  Alert and Oriented x 3 Psych: euthymic mood, full affect  Wt Readings from Last 3 Encounters:  02/20/17 171 lb 1.9 oz (77.6 kg)  02/10/17 174 lb (78.9 kg)  01/31/17 177 lb 6.4 oz (80.5 kg)      Studies/Labs Reviewed:   EKG:  EKG is ordered today.  The ekg ordered today demonstrates atrial flutter at 54 bpm nonspecific ST-T wave changes  Recent Labs: 05/20/2016: B Natriuretic Peptide 585.3 12/20/2016: Magnesium 1.9 01/17/2017: ALT 12; BUN 19; Creat 1.19; Hemoglobin 12.4; Platelets 99; Potassium 4.3; Sodium 137; TSH 1.61   Lipid Panel    Component Value Date/Time   CHOL 113 12/21/2016 0211   CHOL 97 09/09/2013 0957   TRIG 98 12/21/2016 0211   TRIG 92 09/09/2013 0957   HDL 28 (L) 12/21/2016 0211   HDL 37 (L) 09/09/2013 0957   CHOLHDL 4.0 12/21/2016 0211   VLDL 20 12/21/2016 0211   LDLCALC 65 12/21/2016 0211   LDLCALC 42 09/09/2013 0957    Additional studies/ records that were reviewed today include:  2D echo 9/2018Study Conclusions   - Left ventricle: The cavity size was normal. There was moderate   concentric hypertrophy. Systolic  function was normal. The   estimated ejection fraction was in the range of 60% to 65%. Wall   motion was normal; there were no regional wall motion   abnormalities. - Aortic valve: Severely calcified annulus. Trileaflet; mildly   thickened, mildly calcified leaflets. There was mild stenosis.   Valve area (VTI): 1.38 cm^2. Valve area (Vmax): 1.08 cm^2. Valve   area (Vmean): 1.43 cm^2. - Aorta: Aortic root dimension: 40 mm (ED). - Aortic root: The aortic root was mildly dilated. - Mitral valve: Calcified annulus. There was mild regurgitation. - Left atrium: The atrium was severely dilated. - Right atrium: The atrium was moderately dilated. - Tricuspid valve: There was mild regurgitation. - Pulmonary arteries: PA peak pressure: 32 mm Hg (S). - Pericardium, extracardiac: A trivial, free-flowing pericardial   effusion was identified posterior to the heart. The fluid had no   internal echoes.   Nuclear stress test 2017IMPRESSION: 1. No reversible ischemia or infarction.   2. Normal left ventricular wall motion.   3. Left ventricular ejection fraction 57%   4. Non invasive risk stratification*: Low risk   *2012 Appropriate Use Criteria for Coronary Revascularization Focused Update: J Am Coll Cardiol. 5784;69(6):295-284. http://content.airportbarriers.com.aspx?articleid=1201161     Electronically Signed   By: Rolm Baptise M.D.   On: 09/07/2015 14:  CT scan 9/2018IMPRESSION: 1. Small right pleural effusion with slight increased consolidation in the right lower lobe which may reflect a pneumonia. There is a more rounded masslike area of density in the posteromedial right lung base at the site of the patient's prior lung mass, cannot exclude a recurrent mass in the region and attention to this region on follow-up is recommended. 2. Slight increased subpleural linear interstitial densities may reflect mild edema or early changes of fibrosis.   Aortic Atherosclerosis  (ICD10-I70.0).     Electronically Signed   By: Donavan Foil M.D.   On: 12/22/2016 00:11  ASSESSMENT:    1. Acute pericarditis, unspecified type   2. Paroxysmal atrial fibrillation (HCC)   3. S/P CABG (coronary artery bypass graft)   4. Essential hypertension      PLAN:  In order of problems listed above:  Acute pericarditis 12/2016 treated with high-dose aspirin and colchicine.  Chest pain has improved.  Can stop colchicine at the end of December for completion of 6-month treatment if he is not having chest pain.  PAF with CHA2DS2-VASc equal to 4 started on Eliquis 5 mg twice daily.  Patient in atrial flutter today.  He is having a lot of dyspnea on exertion and complains of fast heart rates when moving around.  We will place a 48-hour monitor to make sure he is not tachycardic.  We will not increase atenolol with bradycardia today.  Follow-up with Dr. Irish Lack in 1 month.  CAD status post CABG times 05/23/1996 follow-up cath in 2009 patent grafts, nonischemic Myoview 2017.  Stable without angina  Essential hypertension blood pressure stable  Medication Adjustments/Labs and Tests Ordered: Current medicines are reviewed at length with the patient today.  Concerns regarding medicines are outlined above.  Medication changes, Labs and Tests ordered today are listed in the Patient Instructions below. There are no Patient Instructions on file for this visit.   Sumner Boast, PA-C  02/20/2017 10:30 AM    [facility] [place], [place], [place]  [address] Phone: [phone]; Fax: [phone]

## 2017-02-20 NOTE — Telephone Encounter (Signed)
ok 

## 2017-02-20 NOTE — Telephone Encounter (Signed)
Ok to refill 

## 2017-02-20 NOTE — Patient Instructions (Addendum)
Medication Instructions:  Your physician recommends that you continue on your current medications as directed. Please refer to the Current Medication list given to you today.  YOU MAY STOP THE COLCHICINE AT THE END OF December AS LONG AS YOU'RE NOT HAVING ANY CHEST PAIN  Labwork: None ordered  Testing/Procedures: Your physician has recommended that you wear a holter monitor. Holter monitors are medical devices that record the heart's electrical activity. Doctors most often use these monitors to diagnose arrhythmias. Arrhythmias are problems with the speed or rhythm of the heartbeat. The monitor is a small, portable device. You can wear one while you do your normal daily activities. This is usually used to diagnose what is causing palpitations/syncope (passing out).  Follow-Up: Your physician recommends that you schedule a follow-up appointment in: [place] DR. VARANASI   Any Other Special Instructions Will Be Listed Below (If Applicable).   Holter Monitoring A Holter monitor is a small device that is used to detect abnormal heart rhythms. It clips to your clothing and is connected by wires to flat, sticky disks (electrodes) that attach to your chest. It is worn continuously for 24-48 hours. Follow these instructions at home:  Wear your Holter monitor at all times, even while exercising and sleeping, for as long as directed by your health care provider.  Make sure that the Holter monitor is safely clipped to your clothing or close to your body as recommended by your health care provider.  Do not get the monitor or wires wet.  Do not put body lotion or moisturizer on your chest.  Keep your skin clean.  Keep a diary of your daily activities, such as walking and doing chores. If you feel that your heartbeat is abnormal or that your heart is fluttering or skipping a beat: ? Record what you are doing when it happens. ? Record what time of day the symptoms occur.  Return your Holter  monitor as directed by your health care provider.  Keep all follow-up visits as directed by your health care provider. This is important. Get help right away if:  You feel lightheaded or you faint.  You have trouble breathing.  You feel pain in your chest, upper arm, or jaw.  You feel sick to your stomach and your skin is pale, cool, or damp.  You heartbeat feels unusual or abnormal. This information is not intended to replace advice given to you by your health care provider. Make sure you discuss any questions you have with your health care provider. Document Released: 12/18/2003 Document Revised: 08/27/2015 Document Reviewed: 10/28/2013 Elsevier Interactive Patient Education  [employer].    If you need a refill on your cardiac medications before your next appointment, please call your pharmacy.

## 2017-02-21 NOTE — Telephone Encounter (Signed)
Medication called to pharmacy. 

## 2017-02-22 DIAGNOSIS — R69 Illness, unspecified: Secondary | ICD-10-CM | POA: Diagnosis not present

## 2017-03-06 DIAGNOSIS — R269 Unspecified abnormalities of gait and mobility: Secondary | ICD-10-CM | POA: Diagnosis not present

## 2017-03-06 DIAGNOSIS — R296 Repeated falls: Secondary | ICD-10-CM | POA: Diagnosis not present

## 2017-03-06 DIAGNOSIS — R0602 Shortness of breath: Secondary | ICD-10-CM | POA: Diagnosis not present

## 2017-03-06 DIAGNOSIS — J4520 Mild intermittent asthma, uncomplicated: Secondary | ICD-10-CM | POA: Diagnosis not present

## 2017-03-06 DIAGNOSIS — J449 Chronic obstructive pulmonary disease, unspecified: Secondary | ICD-10-CM | POA: Diagnosis not present

## 2017-03-07 ENCOUNTER — Ambulatory Visit (INDEPENDENT_AMBULATORY_CARE_PROVIDER_SITE_OTHER): Payer: Medicare HMO

## 2017-03-07 DIAGNOSIS — I480 Paroxysmal atrial fibrillation: Secondary | ICD-10-CM | POA: Diagnosis not present

## 2017-03-16 ENCOUNTER — Telehealth: Payer: Self-pay | Admitting: *Deleted

## 2017-03-16 NOTE — Telephone Encounter (Signed)
Pts daughter, [name], [place] on file, has been made aware that pts monitor results was ok. She thanked me for the call.

## 2017-03-16 NOTE — Telephone Encounter (Signed)
-----   Message from Imogene Burn, PA-C sent at 03/16/2017  7:57 AM EST ----- Let patient know his holter monitor showed mostly controlled heart rates ----- Message ----- From: Jettie Booze, MD Sent: 03/15/2017   3:27 PM To: Imogene Burn, PA-C, [name], MD

## 2017-04-06 DIAGNOSIS — R0602 Shortness of breath: Secondary | ICD-10-CM | POA: Diagnosis not present

## 2017-04-06 DIAGNOSIS — R296 Repeated falls: Secondary | ICD-10-CM | POA: Diagnosis not present

## 2017-04-06 DIAGNOSIS — R269 Unspecified abnormalities of gait and mobility: Secondary | ICD-10-CM | POA: Diagnosis not present

## 2017-04-06 DIAGNOSIS — J449 Chronic obstructive pulmonary disease, unspecified: Secondary | ICD-10-CM | POA: Diagnosis not present

## 2017-04-06 DIAGNOSIS — J4520 Mild intermittent asthma, uncomplicated: Secondary | ICD-10-CM | POA: Diagnosis not present

## 2017-04-11 ENCOUNTER — Ambulatory Visit: Payer: Medicare HMO | Admitting: Interventional Cardiology

## 2017-04-11 ENCOUNTER — Encounter: Payer: Self-pay | Admitting: Interventional Cardiology

## 2017-04-11 VITALS — BP 130/78 | HR 69 | Ht 65.0 in | Wt 177.4 lb

## 2017-04-11 DIAGNOSIS — Z951 Presence of aortocoronary bypass graft: Secondary | ICD-10-CM | POA: Diagnosis not present

## 2017-04-11 DIAGNOSIS — I25118 Atherosclerotic heart disease of native coronary artery with other forms of angina pectoris: Secondary | ICD-10-CM

## 2017-04-11 DIAGNOSIS — I300 Acute nonspecific idiopathic pericarditis: Secondary | ICD-10-CM

## 2017-04-11 DIAGNOSIS — I4891 Unspecified atrial fibrillation: Secondary | ICD-10-CM | POA: Diagnosis not present

## 2017-04-11 MED ORDER — COLCHICINE 0.6 MG PO TABS: 0.6000 mg | ORAL_TABLET | Freq: Every day | ORAL | 11 refills | Status: DC

## 2017-04-11 NOTE — Patient Instructions (Signed)
Medication Instructions:  Your physician has recommended you make the following change in your medication:   RESTART: colchicine 0.6 mg every night  Labwork: None ordered  Testing/Procedures: None ordered  Follow-Up: Your physician wants you to follow-up in: 6 months with Dr. Irish Lack. You will receive a reminder letter in the mail two months in advance. If you don't receive a letter, please call our office to schedule the follow-up appointment.   Any Other Special Instructions Will Be Listed Below (If Applicable).     If you need a refill on your cardiac medications before your next appointment, please call your pharmacy.

## 2017-04-11 NOTE — Progress Notes (Signed)
Cardiology Office Note   Date:  04/11/2017   ID:  Evan [name], DOB 12-19-1927, MRN [id number]  PCP:  Evan Frizzle, MD    No chief complaint on file. CAD   Wt Readings from Last 3 Encounters:  04/11/17 177 lb 6.4 oz (80.5 kg)  02/20/17 171 lb 1.9 oz (77.6 kg)  02/10/17 174 lb (78.9 kg)       History of Present Illness: Evan [name] is a 84 y.o. male  with history of CAD status post CABG x2 in 1998 and cath in 2009 showed patent stents and grafts.  Low risk nuclear stress test 2017 he also has history of lung cancer status post radiation therapy.  Hospitalization 12/2016 with pericarditis.  2D echo showed normal LVEF with mild AS, no pericardial effusion.  He was noted to be in atrial fibrillation with spontaneous conversion to normal sinus rhythm.  He was placed on high-dose aspirin and colchicine.  He was not started on NOAC's because of the need to be on high-dose aspirin with pericarditis.  Plavix was stopped.  He was still having chest pain on 01/31/17.  He was told he could use NSAIDs in the morning with breakfast every couple of days.  He was back in atrial fibrillation rate controlled.  He was started on Eliquis 5 mg twice daily for CHA2DS2-VASc score of 4.   At f/u in 11/18, CP had resolved.  He did report shortness of breath with activity.  He reported some palpitations as well.  His heart rate at that time was 54.  48-hour Holter monitor was ordered which showed adequate rate control.  Current medications were continued.  He has been out of colchicine for the past few days.  CP has returned.  Worse with lying down and worse with deep breathing.  Denies :  Dizziness. Leg edema. Nitroglycerin use. Orthopnea. Palpitations. Paroxysmal nocturnal dyspnea. Shortness of breath. Syncope.   No bleeding with Eliquis.    Past Medical History:  Diagnosis Date  . Adenocarcinoma of right lung ([place]) 01/06/2016  . Anginal chest pain at rest [facility])    Chronic non, controlled on  Lorazepam  . Anxiety   . Arthritis    "all over"   . BPH (benign prostatic hyperplasia)   . CAD (coronary artery disease)   . Chronic lower back pain   . Complication of anesthesia    "problems making water afterwards"  . Depression   . DJD (degenerative joint disease)   . Esophageal ulcer without bleeding    bid ppi indefinitely  . Family history of adverse reaction to anesthesia    "children w/PONV"  . GERD (gastroesophageal reflux disease)   . Headache    "couple/week maybe" (09/04/2015)  . Hemochromatosis    Possible, elevated iron stores, hook-like osteophytes on hand films, normal LFTs  . Hepatitis    "yellow jaundice as a baby"  . History of ASCVD    MULTIVESSEL  . Hyperlipidemia   . Hypertension   . Myocardial infarction ([place]) 1999  . Osteoarthritis   . Pneumonia    "several times; got a little now" (09/04/2015)  . Rheumatoid arthritis ([place])   . Subdural hematoma (HCC)    small after fall 09/2016-plavix held, neurosurgery consulted.  Asymptomatic.      Past Surgical History:  Procedure Laterality Date  . ARM SKIN LESION BIOPSY / EXCISION Left 09/02/2015  . CATARACT EXTRACTION W/ INTRAOCULAR LENS  IMPLANT, BILATERAL Bilateral   . CHOLECYSTECTOMY OPEN    .  CORNEAL TRANSPLANT Bilateral    "one at [facility]; one at [facility]"  . CORONARY ANGIOPLASTY WITH STENT PLACEMENT    . CORONARY ARTERY BYPASS GRAFT  1999  . DESCENDING AORTIC ANEURYSM REPAIR W/ STENT     DE stent ostium into the right radial free graft at OM-, 02-2007  . ESOPHAGOGASTRODUODENOSCOPY N/A 11/09/2014   Procedure: ESOPHAGOGASTRODUODENOSCOPY (EGD);  Surgeon: [name], MD;  Location: [facility] ENDOSCOPY;  Service: Endoscopy;  Laterality: N/A;  . INGUINAL HERNIA REPAIR    . JOINT REPLACEMENT    . NASAL SINUS SURGERY    . SHOULDER OPEN ROTATOR CUFF REPAIR Right   . TOTAL KNEE ARTHROPLASTY Bilateral      Current Outpatient Medications  Medication Sig Dispense Refill  . apixaban (ELIQUIS) 5 MG TABS tablet Take 1 tablet  (5 mg total) by mouth 2 (two) times daily. 60 tablet 11  . atenolol (TENORMIN) 25 MG tablet Take 25 mg by mouth daily.    . Cholecalciferol 2000 units TABS Take 1 tablet (2,000 Units total) by mouth daily. 30 each 11  . Cyanocobalamin (VITAMIN B-12 PO) Take 1 tablet by mouth daily.    Marland Kitchen FLUoxetine (PROZAC) 20 MG capsule TAKE 20 mg  BY MOUTH DAILY 90 capsule 1  . Fluticasone-Salmeterol (ADVAIR DISKUS) 100-50 MCG/DOSE AEPB Take 30 mg by mouth daily.    Marland Kitchen ipratropium-albuterol (DUONEB) 0.5-2.5 (3) MG/3ML SOLN Take 3 mLs by nebulization every 4 (four) hours as needed (or Chest Pain / Tightness). 360 mL 3  . Lifitegrast (XIIDRA) 5 % SOLN Place 1-2 drops into both eyes daily.    Marland Kitchen LORazepam (ATIVAN) 1 MG tablet TAKE ONE (1) TABLET BY MOUTH 3 TIMES DAILY 90 tablet 1  . nitroGLYCERIN (NITROSTAT) 0.4 MG SL tablet Place 1 tablet (0.4 mg total) under the tongue every 5 (five) minutes as needed for chest pain. 25 tablet 2  . pantoprazole (PROTONIX) 40 MG tablet Take one (1) tablet (40 mg) by mouth daily. 90 tablet 3  . rosuvastatin (CRESTOR) 5 MG tablet TAKE 5 mg  EVERY DAY(NEED TO SCHEDULE YEARLY APPOINTMENT FOR AUGUST ) 90 tablet 3  . Spacer/Aero-Holding Chambers (AEROCHAMBER PLUS WITH MASK) inhaler Use as instructed 1 each 2  . tamsulosin (FLOMAX) 0.4 MG CAPS capsule TAKE 0.4 gm by mouth EVERY DAY 90 capsule 3  . colchicine 0.6 MG tablet Take 1 tablet (0.6 mg total) by mouth daily. 30 tablet 11   No current facility-administered medications for this visit.     Allergies:   Feldene [piroxicam]; Statins; Doxycycline; Latex; Tequin; and Valium    Social History:  The patient  reports that  has never smoked. He has quit using smokeless tobacco. His smokeless tobacco use included chew. He reports that he does not drink alcohol or use drugs.   Family History:  The patient's family history includes Arthritis-Osteo in his sister; Heart attack in his brother and other.    ROS:  Please see the history of  present illness.   Otherwise, review of systems are positive for chest pain as noted above.   All other systems are reviewed and negative.    PHYSICAL EXAM: VS:  BP 130/78   Pulse 69   Ht 5\' 5"  (1.651 m)   Wt 177 lb 6.4 oz (80.5 kg)   BMI 29.52 kg/m  , BMI Body mass index is 29.52 kg/m. GEN: Well nourished, well developed, in no acute distress  HEENT: normal  Neck: no JVD, carotid bruits, or masses Cardiac: RRR; no murmurs,  rubs, or gallops,no edema  Respiratory:  clear to auscultation bilaterally, normal work of breathing GI: soft, nontender, nondistended, + BS MS: no deformity or atrophy  Skin: warm and dry, no rash Neuro:  Strength and sensation are intact Psych: euthymic mood, full affect   EKG:   The ekg ordered today demonstrates NSR, LVH, nonspecific ST changes   Recent Labs: 05/20/2016: B Natriuretic Peptide 585.3 12/20/2016: Magnesium 1.9 01/17/2017: ALT 12; BUN 19; Creat 1.19; Hemoglobin 12.4; Platelets 99; Potassium 4.3; Sodium 137; TSH 1.61   Lipid Panel    Component Value Date/Time   CHOL 113 12/21/2016 0211   CHOL 97 09/09/2013 0957   TRIG 98 12/21/2016 0211   TRIG 92 09/09/2013 0957   HDL 28 (L) 12/21/2016 0211   HDL 37 (L) 09/09/2013 0957   CHOLHDL 4.0 12/21/2016 0211   VLDL 20 12/21/2016 0211   LDLCALC 65 12/21/2016 0211   LDLCALC 42 09/09/2013 0957     Other studies Reviewed: Additional studies/ records that were reviewed today with results demonstrating: labs noted; old ECG reviewed- no change compared to today.   ASSESSMENT AND PLAN:  1. CAD: No angina.  Continue aggressive secondary prevention.  LDL controlled in September 2018.  Would not pursue ischemia testing.  Crestor for lipids. 2. Pericarditis: Occasional NSAID use was helpful to reduce his symptoms.  He has been off of colchicine and sx returned.  Ok to restart colchicine 0.6 mg QHS.  He tolerated this well.  3. Lung cancer: Seen by oncology. 4. AFib/AFlutter: Eliquis for stroke  prevention.  No bleeding problems.   Current medicines are reviewed at length with the patient today.  The patient concerns regarding his medicines were addressed.  The following changes have been made: Restart colchicine  Labs/ tests ordered today include:   Orders Placed This Encounter  Procedures  . EKG 12-Lead    Recommend 150 minutes/week of aerobic exercise Low fat, low carb, high fiber diet recommended  Disposition:   FU in 6 months   Signed, Larae Grooms, MD  04/11/2017 11:05 AM    [facility] [place], [place], [place]  [address] Phone: [phone]; Fax: [phone]

## 2017-04-12 ENCOUNTER — Ambulatory Visit: Payer: Medicare HMO | Admitting: Physician Assistant

## 2017-04-13 ENCOUNTER — Telehealth: Payer: Self-pay | Admitting: *Deleted

## 2017-04-13 NOTE — Telephone Encounter (Signed)
CALLED PATIENT TO INFORM OF STAT LABS ON 04-20-17 @ 11:15 AM @ [place] AND HIS CT ON 04-20-17- ARRIVAL TIME - 12:15 PM, TEST TO BE @ [facility], PT. TO HAVE WATER ONLY -STARTING @ 8:30 AM, FU WITH ASHLYN BRUNING ON 04-21-17 @ 10:30 AM @ [facility], LVM FOR A RETURN CALL

## 2017-04-13 NOTE — Progress Notes (Signed)
Evan [name].  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  Weight changes, if any: Wt Readings from Last 3 Encounters:  04/21/17 174 lb 12.8 oz (79.3 kg)  04/11/17 177 lb 6.4 oz (80.5 kg)  02/20/17 171 lb 1.9 oz (77.6 kg)  Respiratory complaints, if any: SOB with exertion and in bed,coughing nonproductive to clear secretion Hemoptysis, if any: No  Swallowing Problems/Pain/Difficulty swallowing:No Appetite :Good Pain:No When is next chemo scheduled?: No 08-16-16 Saw Dr. Julien Nordmann the patient has been on observation for the last few months. BP (!) 154/88 (BP Location: Left Arm, Patient Position: Sitting, Cuff Size: Normal)   Pulse 71   Temp 97.8 F (36.6 C) (Oral)   Resp 18   Ht 5\' 5"  (1.651 m)   Wt 174 lb 12.8 oz (79.3 kg)   SpO2 99%   BMI 29.09 kg/m

## 2017-04-20 ENCOUNTER — Other Ambulatory Visit: Payer: Self-pay | Admitting: *Deleted

## 2017-04-20 ENCOUNTER — Ambulatory Visit
Admission: RE | Admit: 2017-04-20 | Discharge: 2017-04-20 | Disposition: A | Discharge status: Home / Self Care | Payer: Medicare HMO | Source: Ambulatory Visit | Attending: Radiation Oncology | Admitting: Radiation Oncology

## 2017-04-20 ENCOUNTER — Encounter (HOSPITAL_COMMUNITY): Payer: Self-pay

## 2017-04-20 ENCOUNTER — Ambulatory Visit (HOSPITAL_COMMUNITY)
Admission: RE | Admit: 2017-04-20 | Discharge: 2017-04-20 | Disposition: A | Discharge status: Home / Self Care | Payer: Medicare HMO | Source: Ambulatory Visit | Attending: Radiation Oncology | Admitting: Radiation Oncology

## 2017-04-20 DIAGNOSIS — R918 Other nonspecific abnormal finding of lung field: Secondary | ICD-10-CM | POA: Diagnosis not present

## 2017-04-20 DIAGNOSIS — Z08 Encounter for follow-up examination after completed treatment for malignant neoplasm: Secondary | ICD-10-CM | POA: Diagnosis not present

## 2017-04-20 DIAGNOSIS — C3491 Malignant neoplasm of unspecified part of right bronchus or lung: Secondary | ICD-10-CM

## 2017-04-20 DIAGNOSIS — J439 Emphysema, unspecified: Secondary | ICD-10-CM | POA: Diagnosis not present

## 2017-04-20 DIAGNOSIS — Z85118 Personal history of other malignant neoplasm of bronchus and lung: Secondary | ICD-10-CM | POA: Insufficient documentation

## 2017-04-20 DIAGNOSIS — Z951 Presence of aortocoronary bypass graft: Secondary | ICD-10-CM | POA: Diagnosis not present

## 2017-04-20 DIAGNOSIS — I700 Atherosclerosis of aorta: Secondary | ICD-10-CM | POA: Insufficient documentation

## 2017-04-20 DIAGNOSIS — Z79899 Other long term (current) drug therapy: Secondary | ICD-10-CM | POA: Diagnosis not present

## 2017-04-20 LAB — BASIC METABOLIC PANEL
Anion gap: 10 (ref 3–11)
BUN: 17 mg/dL (ref 7–26)
CO2: 23 mmol/L (ref 22–29)
Calcium: 9.3 mg/dL (ref 8.4–10.4)
Chloride: 106 mmol/L (ref 98–109)
Creatinine, Ser: 1.34 mg/dL — ABNORMAL HIGH (ref 0.70–1.30)
GFR calc Af Amer: 53 mL/min — ABNORMAL LOW (ref >60)
GFR calc non Af Amer: 46 mL/min — ABNORMAL LOW (ref >60)
Glucose, Bld: 100 mg/dL (ref 70–140)
Potassium: 4.7 mmol/L (ref 3.5–5.1)
Sodium: 139 mmol/L (ref 136–145)

## 2017-04-20 MED ORDER — IOPAMIDOL (ISOVUE-300) INJECTION 61%
INTRAVENOUS | Status: AC
  Administered 2017-04-20: 75 mL via INTRAVENOUS
  Filled 2017-04-20: qty 75

## 2017-04-20 MED ORDER — IOPAMIDOL (ISOVUE-300) INJECTION 61%
75.0000 mL | Freq: Once | INTRAVENOUS | Status: AC | PRN
  Administered 2017-04-20: 75 mL via INTRAVENOUS

## 2017-04-21 ENCOUNTER — Ambulatory Visit
Admission: RE | Admit: 2017-04-21 | Discharge: 2017-04-21 | Disposition: A | Discharge status: Home / Self Care | Payer: Medicare HMO | Source: Ambulatory Visit | Attending: Urology | Admitting: Urology

## 2017-04-21 ENCOUNTER — Other Ambulatory Visit: Payer: Self-pay

## 2017-04-21 ENCOUNTER — Encounter: Payer: Self-pay | Admitting: Urology

## 2017-04-21 VITALS — BP 154/88 | HR 71 | Temp 97.8°F | Resp 18 | Ht 65.0 in | Wt 174.8 lb

## 2017-04-21 DIAGNOSIS — Z08 Encounter for follow-up examination after completed treatment for malignant neoplasm: Secondary | ICD-10-CM | POA: Diagnosis not present

## 2017-04-21 DIAGNOSIS — Z79899 Other long term (current) drug therapy: Secondary | ICD-10-CM | POA: Diagnosis not present

## 2017-04-21 DIAGNOSIS — Z85118 Personal history of other malignant neoplasm of bronchus and lung: Secondary | ICD-10-CM | POA: Diagnosis not present

## 2017-04-21 DIAGNOSIS — C3431 Malignant neoplasm of lower lobe, right bronchus or lung: Secondary | ICD-10-CM | POA: Diagnosis not present

## 2017-04-21 DIAGNOSIS — J439 Emphysema, unspecified: Secondary | ICD-10-CM | POA: Diagnosis not present

## 2017-04-21 DIAGNOSIS — C3491 Malignant neoplasm of unspecified part of right bronchus or lung: Secondary | ICD-10-CM

## 2017-04-21 DIAGNOSIS — Z951 Presence of aortocoronary bypass graft: Secondary | ICD-10-CM | POA: Diagnosis not present

## 2017-04-21 NOTE — Progress Notes (Signed)
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   In summary, he was initially seen at the request of Dr. Julien Nordmann  On 01/25/16 for a new diagnosis of early stage lung cancer. The patient had been seen by Dr. Vaughan Browner in pulmonology on 12/03/15 complaining of chest pain and worsening shortness of breath. A CT scan of the chest on 12/15/2015 revealed interval enlargement of 2.7 x 1.6 x 2.0 cm right lower lobe mass highly suspicious for primary bronchogenic carcinoma. There was also borderline enlargement 0.9 cm high right paratracheal lymph node that was nonspecific. A PET scan was performed on 12/22/2015 and it showed predominantly solid 2.9 x 2.6 cm posterior right lower lobe pulmonary nodule with peripheral groundglass density mildly hypermetabolic with maximum SUV of 3.9 which increased from 1.3 x 1.1 cm. There was hypermetabolic bilateral hilar and mediastinal adenopathy highly nonspecific given the suspected underlying interstitial lung disease and could be reactive or metastatic. There was also nonspecific hypermetabolic right level II lymph nodes favoring a reactive node. On 12/24/2015 the patient underwent CT-guided core biopsy of the right lower lobe lung nodule by interventional radiology. The final pathology revealed adenocarcinoma.  MRI of the brain performed on 01/15/2016 showed no evidence for metastatic disease to the brain.  He elected to proceed with SBRT to the RLL mass for  definitive treatment and received 5 fractions to a total dose of 50Gy between 02/15/2016 to 02/22/2016.  He tolerated treatment well and had no significant adverse effects. His initial post-treatment CT Chest demonstrated evolving postradiation changes as well as an interval increase in size of the treated RLL nodule and hilar nodes but were felt likely related to inflammatory changes associated with recent SBRT.  His subsequent follow up Chest CTs for disease surveillance have not shown evidence for disease progression.  He continues in follow up with Dr. Vaughan Browner in pulmonology for underlying COPD/emphysema.  He had a recent follow-up chest CT on 04/20/2017 which shows a similar appearance of the posterior and medial interstitial and airspace opacities in the right lung base. The nodular component measured on previous study has decreased in the interval and there are stable scattered mediastinal lymph nodes.  He presents today with family to review these findings.                        On review of systems, the patient states that he is doing well overall. He continues with chronic shortness of breath which is exacerbated with exertion as well as a chronic productive cough with whitish sputum.  He denies hemoptysis, chest pain, fever, chills or night sweats. He reports a good appetite and has been maintaining his weight.  He denies headaches, changes in visual or auditory acuity, difficulty with speech, imbalance, tremor or seizure activity.  ALLERGIES:  is allergic to feldene [piroxicam]; statins; doxycycline; latex; tequin; and valium.  Meds: Current Outpatient Medications  Medication Sig Dispense Refill  . apixaban (ELIQUIS) 5 MG TABS tablet Take 1 tablet (5 mg total) by mouth 2 (two) times daily. 60 tablet 11  . atenolol (TENORMIN) 25 MG tablet Take 25 mg by mouth daily.    . Cholecalciferol 2000 units TABS Take 1 tablet (2,000 Units total) by mouth daily. 30 each 11  . colchicine 0.6 MG tablet  Take 1 tablet (0.6 mg total) by mouth daily. 30 tablet 11  . Cyanocobalamin (VITAMIN B-12 PO) Take 1 tablet by mouth daily.    Marland Kitchen FLUoxetine (PROZAC) 20 MG capsule TAKE 20 mg  BY MOUTH DAILY 90 capsule 1  . ipratropium-albuterol (DUONEB) 0.5-2.5 (3) MG/3ML SOLN Take 3 mLs by nebulization every 4 (four) hours as needed (or Chest Pain / Tightness). 360 mL 3  . Lifitegrast (XIIDRA) 5 % SOLN Place 1-2 drops into both eyes daily.    Marland Kitchen LORazepam (ATIVAN) 1 MG tablet TAKE ONE (1) TABLET BY MOUTH 3 TIMES DAILY 90 tablet 1  . pantoprazole (PROTONIX) 40 MG tablet Take one (1) tablet (40 mg) by mouth daily. 90 tablet 3  . rosuvastatin (CRESTOR) 5 MG tablet TAKE 5 mg  EVERY DAY(NEED TO SCHEDULE YEARLY APPOINTMENT FOR AUGUST ) 90 tablet 3  . tamsulosin (FLOMAX) 0.4 MG CAPS capsule TAKE 0.4 gm by mouth EVERY DAY 90 capsule 3  . Fluticasone-Salmeterol (ADVAIR DISKUS) 100-50 MCG/DOSE AEPB Take 30 mg by mouth daily.    . nitroGLYCERIN (NITROSTAT) 0.4 MG SL tablet Place 1 tablet (0.4 mg total) under the tongue every 5 (five) minutes as needed for chest pain. (Patient not taking: Reported on 04/21/2017) 25 tablet 2  . Spacer/Aero-Holding Chambers (AEROCHAMBER PLUS WITH MASK) inhaler Use as instructed (Patient not taking: Reported on 04/21/2017) 1 each 2   No current facility-administered medications for this encounter.     Physical Findings:  height is 5\' 5"  (1.651 m) and weight is 174 lb 12.8 oz (79.3 kg). His oral temperature is 97.8 F (36.6 C). His blood pressure is 154/88 (abnormal) and his pulse is 71. His respiration is 18 and oxygen saturation is 99%.  Pain Assessment Pain Score: 0-No pain/10 In general this is a well appearing Caucasian male in no acute distress. He's alert and oriented x4 and appropriate throughout the examination. Cardiopulmonary assessment is negative for acute distress and he exhibits normal effort.   Lab Findings: Lab Results  Component Value Date   WBC 5.5 01/17/2017   HGB 12.4  (L) 01/17/2017   HCT 36.8 (L) 01/17/2017   MCV 92.0 01/17/2017   PLT 99 (L) 01/17/2017     Radiographic Findings: Ct Chest W Contrast  Result Date: 04/20/2017 CLINICAL DATA:  Right-sided lung cancer. EXAM: CT CHEST WITH CONTRAST TECHNIQUE: Multidetector CT imaging of the chest was performed during intravenous contrast administration. CONTRAST:  75 cc ISOVUE 300 COMPARISON:  12/21/2016.  05/21/2016. FINDINGS: Cardiovascular: Heart is enlarged. Coronary artery calcification is evident. Patient is status post CABG. Mediastinum/Nodes: Stable appearance 10 mm right paratracheal lymph node. Other scattered small mediastinal lymph nodes are also stable. There is no hilar lymphadenopathy. There is no axillary lymphadenopathy. The esophagus has normal imaging features. Lungs/Pleura: Centrilobular emphysema. Fine lung detail obscured by breathing motion. Similar appearance of the interstitial and airspace opacity in the medial right lower lobe extending into the costophrenic sulcus. The nodular component measured previously at 3.5 cm is now 1.2 cm. Stable volume loss left hemithorax without focal airspace consolidation or pulmonary edema. Calcified granuloma identified posterior lung  base. Upper Abdomen: Unremarkable. Musculoskeletal: Bone windows reveal no worrisome lytic or sclerotic osseous lesions. IMPRESSION: Similar appearance of the posterior and medial interstitial and airspace opacity in the right lung base. The nodular component measured on the previous study has decreased in the interval. Stable scattered mediastinal lymph nodes. Emphysema. (ICD10-J43.9). Aortic Atherosclerois (ICD10-170.0) Electronically Signed   By: Misty Stanley M.D.   On: 04/20/2017 19:48    Impression/Plan: 61. 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 We reviewed his most recent chest CT results which revealed disease stability with no new concerning nodules or evidence of disease progression. We  will continue to monitor him closely with a repeat chest CT in approximately 4 months. He will follow up in the office thereafter to review results and any further recommendations. He will continue to follow-up with Dr. Vaughan Browner in pulmonology for his underlying interstitial lung disease contributing to his chronic shortness of breath and cough.   we will see him back in the office after his next imaging. He knows to call with any questions or concerns in the interim.       Nicholos Johns, PA-C

## 2017-04-21 NOTE — Addendum Note (Signed)
Encounter addended by: Malena Edman, RN on: 04/21/2017 2:44 PM  Actions taken: Charge Capture section accepted

## 2017-04-28 ENCOUNTER — Other Ambulatory Visit: Payer: Self-pay | Admitting: Family Medicine

## 2017-04-28 NOTE — Telephone Encounter (Signed)
Requesting refill Ativan      LOV: 02/10/17  LRF: 02/21/17

## 2017-05-04 ENCOUNTER — Encounter: Payer: Self-pay | Admitting: Family Medicine

## 2017-05-04 ENCOUNTER — Ambulatory Visit (INDEPENDENT_AMBULATORY_CARE_PROVIDER_SITE_OTHER): Payer: Medicare HMO | Admitting: Family Medicine

## 2017-05-04 VITALS — BP 134/68 | HR 72 | Temp 97.5°F | Resp 20 | Ht 68.0 in | Wt 174.0 lb

## 2017-05-04 DIAGNOSIS — R0602 Shortness of breath: Secondary | ICD-10-CM

## 2017-05-04 NOTE — Progress Notes (Signed)
Subjective:    Patient ID: Evan [name], male    DOB: 09/19/1927, 84 y.o.   MRN: [id number]  HPI Patient has a complicated past medical history including history of coronary artery disease, acute pericarditis requiring hospitalization for shortness of breath last year, right-sided lung cancer status post radiation treatments, as well as a history of COPD.  He presents with 2-3 weeks of shortness of breath.  He states whenever he wakes up in the morning, he feels short of breath.  As he gets up and moves around it improves.  He denies any new chest pain.  Patient just saw his cardiologist earlier this month.  He denies any angina.  He denies any fevers or chills.  He reports occasional cough.  Cough is nonproductive.  Patient states that it is just hard for him to breathe at times.  He is supposed to be on Advair for COPD however he is not use the medication in several months.  He occasionally uses the nebulizer treatments and see some benefit.  He denies any sinus pain.  He denies any rhinorrhea.  He denies any sore throat.  He denies any bleeding.  He denies any melena or hematochezia.  He denies any swelling.  There is no pitting edema today on exam.  Lungs are clear to auscultation bilaterally with no crackles or evidence of pulmonary edema. Past Medical History:  Diagnosis Date  . Adenocarcinoma of right lung ([place]) 01/06/2016  . Anginal chest pain at rest [facility])    Chronic non, controlled on Lorazepam  . Anxiety   . Arthritis    "all over"   . BPH (benign prostatic hyperplasia)   . CAD (coronary artery disease)   . Chronic lower back pain   . Complication of anesthesia    "problems making water afterwards"  . Depression   . DJD (degenerative joint disease)   . Esophageal ulcer without bleeding    bid ppi indefinitely  . Family history of adverse reaction to anesthesia    "children w/PONV"  . GERD (gastroesophageal reflux disease)   . Headache    "couple/week maybe" (09/04/2015)  .  Hemochromatosis    Possible, elevated iron stores, hook-like osteophytes on hand films, normal LFTs  . Hepatitis    "yellow jaundice as a baby"  . History of ASCVD    MULTIVESSEL  . Hyperlipidemia   . Hypertension   . Myocardial infarction ([place]) 1999  . Osteoarthritis   . Pneumonia    "several times; got a little now" (09/04/2015)  . Rheumatoid arthritis ([place])   . Subdural hematoma ([facility])    small after fall 09/2016-plavix held, neurosurgery consulted.  Asymptomatic.     Past Surgical History:  Procedure Laterality Date  . ARM SKIN LESION BIOPSY / EXCISION Left 09/02/2015  . CATARACT EXTRACTION W/ INTRAOCULAR LENS  IMPLANT, BILATERAL Bilateral   . CHOLECYSTECTOMY OPEN    . CORNEAL TRANSPLANT Bilateral    "one at [facility]; one at [facility]"  . CORONARY ANGIOPLASTY WITH STENT PLACEMENT    . CORONARY ARTERY BYPASS GRAFT  1999  . DESCENDING AORTIC ANEURYSM REPAIR W/ STENT     DE stent ostium into the right radial free graft at OM-, 02-2007  . ESOPHAGOGASTRODUODENOSCOPY N/A 11/09/2014   Procedure: ESOPHAGOGASTRODUODENOSCOPY (EGD);  Surgeon: [name], MD;  Location: [facility] ENDOSCOPY;  Service: Endoscopy;  Laterality: N/A;  . INGUINAL HERNIA REPAIR    . JOINT REPLACEMENT    . NASAL SINUS SURGERY    . SHOULDER OPEN  ROTATOR CUFF REPAIR Right   . TOTAL KNEE ARTHROPLASTY Bilateral    Current Outpatient Medications on File Prior to Visit  Medication Sig Dispense Refill  . apixaban (ELIQUIS) 5 MG TABS tablet Take 1 tablet (5 mg total) by mouth 2 (two) times daily. 60 tablet 11  . atenolol (TENORMIN) 25 MG tablet Take 25 mg by mouth daily.    . Cholecalciferol 2000 units TABS Take 1 tablet (2,000 Units total) by mouth daily. 30 each 11  . colchicine 0.6 MG tablet Take 1 tablet (0.6 mg total) by mouth daily. 30 tablet 11  . Cyanocobalamin (VITAMIN B-12 PO) Take 1 tablet by mouth daily.    Marland Kitchen FLUoxetine (PROZAC) 20 MG capsule TAKE 20 mg  BY MOUTH DAILY 90 capsule 1  . Fluticasone-Salmeterol (ADVAIR DISKUS)  100-50 MCG/DOSE AEPB Take 30 mg by mouth daily.    Marland Kitchen ipratropium-albuterol (DUONEB) 0.5-2.5 (3) MG/3ML SOLN Take 3 mLs by nebulization every 4 (four) hours as needed (or Chest Pain / Tightness). 360 mL 3  . Lifitegrast (XIIDRA) 5 % SOLN Place 1-2 drops into both eyes daily.    Marland Kitchen LORazepam (ATIVAN) 1 MG tablet TAKE ONE (1) TABLET BY MOUTH 3 TIMES DAILY 90 tablet 1  . nitroGLYCERIN (NITROSTAT) 0.4 MG SL tablet Place 1 tablet (0.4 mg total) under the tongue every 5 (five) minutes as needed for chest pain. 25 tablet 2  . pantoprazole (PROTONIX) 40 MG tablet Take one (1) tablet (40 mg) by mouth daily. 90 tablet 3  . rosuvastatin (CRESTOR) 5 MG tablet TAKE 5 mg  EVERY DAY(NEED TO SCHEDULE YEARLY APPOINTMENT FOR AUGUST ) 90 tablet 3  . Spacer/Aero-Holding Chambers (AEROCHAMBER PLUS WITH MASK) inhaler Use as instructed 1 each 2  . tamsulosin (FLOMAX) 0.4 MG CAPS capsule TAKE 0.4 gm by mouth EVERY DAY 90 capsule 3   No current facility-administered medications on file prior to visit.    Allergies  Allergen Reactions  . Feldene [Piroxicam] Other (See Comments)    REACTION: Blisters  . Statins Other (See Comments)    Myalgias  . Doxycycline Other (See Comments)    REACTION:  unknown  . Latex Rash  . Tequin Anxiety  . Valium Other (See Comments)    REACTION:  unknown   Social History   Socioeconomic History  . Marital status: Widowed    Spouse name: Not on file  . Number of children: Not on file  . Years of education: Not on file  . Highest education level: Not on file  Social Needs  . Financial resource strain: Not on file  . Food insecurity - worry: Not on file  . Food insecurity - inability: Not on file  . Transportation needs - medical: Not on file  . Transportation needs - non-medical: Not on file  Occupational History  . Not on file  Tobacco Use  . Smoking status: Never Smoker  . Smokeless tobacco: Former Systems developer    Types: Chew  . Tobacco comment: "quit chewing in the 1960s"    Substance and Sexual Activity  . Alcohol use: No  . Drug use: No  . Sexual activity: No  Other Topics Concern  . Not on file  Social History Narrative  . Not on file      Review of Systems  All other systems reviewed and are negative.      Objective:   Physical Exam  Constitutional: He appears well-developed and well-nourished. No distress.  HENT:  Right Ear: External ear normal.  Left Ear: External ear normal.  Nose: Nose normal.  Mouth/Throat: Oropharynx is clear and moist. No oropharyngeal exudate.  Eyes: Conjunctivae are normal.  Neck: Neck supple. No JVD present.  Cardiovascular: Normal rate, regular rhythm and normal heart sounds.  No murmur heard. Pulmonary/Chest: Effort normal and breath sounds normal. No respiratory distress. He has no wheezes. He has no rales.  Abdominal: Soft. Bowel sounds are normal. He exhibits no distension. There is no tenderness. There is no rebound and no guarding.  Musculoskeletal: He exhibits no edema.  Lymphadenopathy:    He has no cervical adenopathy.  Skin: He is not diaphoretic.  Vitals reviewed.         Assessment & Plan:  Shortness of breath  Exam today is unremarkable.  I see no evidence of pneumonia, bronchitis, or other infection.  There is no evidence of fluid overload on exam or pulmonary edema.  He denies any chest pain or angina.  Based on his calm appearance, unlabored breathing I doubt PE. He has not been compliant with his Advair.  I have asked the patient to resume Advair 100/50, 1 inhalation twice daily and then recheck in 2 weeks to see if shortness of breath is improving.  Recheck immediately if shortness of breath worsens.  His dyspnea today is very mild.  In fact he states that he is back to his baseline this morning.  Patient has had similar complaints in the past recently with a negative workup other than pericarditis including a prolonged admission last year.

## 2017-05-07 DIAGNOSIS — R296 Repeated falls: Secondary | ICD-10-CM | POA: Diagnosis not present

## 2017-05-07 DIAGNOSIS — J449 Chronic obstructive pulmonary disease, unspecified: Secondary | ICD-10-CM | POA: Diagnosis not present

## 2017-05-07 DIAGNOSIS — R269 Unspecified abnormalities of gait and mobility: Secondary | ICD-10-CM | POA: Diagnosis not present

## 2017-05-07 DIAGNOSIS — R0602 Shortness of breath: Secondary | ICD-10-CM | POA: Diagnosis not present

## 2017-05-07 DIAGNOSIS — J4520 Mild intermittent asthma, uncomplicated: Secondary | ICD-10-CM | POA: Diagnosis not present

## 2017-05-10 ENCOUNTER — Encounter (HOSPITAL_COMMUNITY): Payer: Self-pay

## 2017-05-10 ENCOUNTER — Emergency Department (HOSPITAL_COMMUNITY): Payer: Medicare HMO

## 2017-05-10 DIAGNOSIS — J45909 Unspecified asthma, uncomplicated: Secondary | ICD-10-CM | POA: Insufficient documentation

## 2017-05-10 DIAGNOSIS — Z951 Presence of aortocoronary bypass graft: Secondary | ICD-10-CM | POA: Diagnosis not present

## 2017-05-10 DIAGNOSIS — I252 Old myocardial infarction: Secondary | ICD-10-CM | POA: Diagnosis not present

## 2017-05-10 DIAGNOSIS — R0602 Shortness of breath: Secondary | ICD-10-CM | POA: Diagnosis not present

## 2017-05-10 DIAGNOSIS — I10 Essential (primary) hypertension: Secondary | ICD-10-CM | POA: Insufficient documentation

## 2017-05-10 DIAGNOSIS — Z91040 Latex allergy status: Secondary | ICD-10-CM | POA: Diagnosis not present

## 2017-05-10 DIAGNOSIS — Z79899 Other long term (current) drug therapy: Secondary | ICD-10-CM | POA: Insufficient documentation

## 2017-05-10 DIAGNOSIS — R062 Wheezing: Secondary | ICD-10-CM | POA: Diagnosis not present

## 2017-05-10 DIAGNOSIS — R05 Cough: Secondary | ICD-10-CM | POA: Diagnosis not present

## 2017-05-10 DIAGNOSIS — Z87891 Personal history of nicotine dependence: Secondary | ICD-10-CM | POA: Insufficient documentation

## 2017-05-10 DIAGNOSIS — R079 Chest pain, unspecified: Secondary | ICD-10-CM | POA: Diagnosis not present

## 2017-05-10 DIAGNOSIS — Z7901 Long term (current) use of anticoagulants: Secondary | ICD-10-CM | POA: Diagnosis not present

## 2017-05-10 DIAGNOSIS — I2510 Atherosclerotic heart disease of native coronary artery without angina pectoris: Secondary | ICD-10-CM | POA: Insufficient documentation

## 2017-05-10 DIAGNOSIS — J449 Chronic obstructive pulmonary disease, unspecified: Secondary | ICD-10-CM | POA: Diagnosis not present

## 2017-05-10 DIAGNOSIS — Z96653 Presence of artificial knee joint, bilateral: Secondary | ICD-10-CM | POA: Insufficient documentation

## 2017-05-10 DIAGNOSIS — J441 Chronic obstructive pulmonary disease with (acute) exacerbation: Secondary | ICD-10-CM | POA: Insufficient documentation

## 2017-05-10 LAB — BASIC METABOLIC PANEL
Anion gap: 10 (ref 5–15)
BUN: 16 mg/dL (ref 6–20)
CO2: 19 mmol/L — ABNORMAL LOW (ref 22–32)
Calcium: 9 mg/dL (ref 8.9–10.3)
Chloride: 109 mmol/L (ref 101–111)
Creatinine, Ser: 1.51 mg/dL — ABNORMAL HIGH (ref 0.61–1.24)
GFR calc Af Amer: 46 mL/min — ABNORMAL LOW (ref >60)
GFR calc non Af Amer: 40 mL/min — ABNORMAL LOW (ref >60)
Glucose, Bld: 93 mg/dL (ref 65–99)
Potassium: 4.5 mmol/L (ref 3.5–5.1)
Sodium: 138 mmol/L (ref 135–145)

## 2017-05-10 LAB — CBC
HCT: 39.6 % (ref 39.0–52.0)
Hemoglobin: 12.9 g/dL — ABNORMAL LOW (ref 13.0–17.0)
MCH: 30.2 pg (ref 26.0–34.0)
MCHC: 32.6 g/dL (ref 30.0–36.0)
MCV: 92.7 fL (ref 78.0–100.0)
Platelets: 94 10*3/uL — ABNORMAL LOW (ref 150–400)
RBC: 4.27 MIL/uL (ref 4.22–5.81)
RDW: 14.2 % (ref 11.5–15.5)
WBC: 6 10*3/uL (ref 4.0–10.5)

## 2017-05-10 LAB — I-STAT TROPONIN, ED: Troponin i, poc: 0.03 ng/mL (ref 0.00–0.08)

## 2017-05-10 NOTE — ED Triage Notes (Signed)
Patient complains of 2 days of CP with cough and shortness of breath. Reports cough. Has been taking NTG earlier in week and none today. Alert and oriented, NAD

## 2017-05-10 NOTE — ED Notes (Signed)
Patient and family up to desk asking about wait time, they've been here 8+ hours. Explained delays, that ED is fully staffed and all rooms open - everyone working as hard as possible. Patient will continue to wait.

## 2017-05-11 ENCOUNTER — Emergency Department (HOSPITAL_COMMUNITY)
Admission: EM | Admit: 2017-05-11 | Discharge: 2017-05-11 | Disposition: A | Discharge status: Home / Self Care | Payer: Medicare HMO | Attending: Emergency Medicine | Admitting: Emergency Medicine

## 2017-05-11 DIAGNOSIS — J441 Chronic obstructive pulmonary disease with (acute) exacerbation: Secondary | ICD-10-CM

## 2017-05-11 LAB — I-STAT TROPONIN, ED: Troponin i, poc: 0.03 ng/mL (ref 0.00–0.08)

## 2017-05-11 LAB — D-DIMER, QUANTITATIVE: D-Dimer, Quant: 0.38 ug/mL-FEU (ref 0.00–0.50)

## 2017-05-11 LAB — BRAIN NATRIURETIC PEPTIDE: B Natriuretic Peptide: 454.7 pg/mL — ABNORMAL HIGH (ref 0.0–100.0)

## 2017-05-11 MED ORDER — PREDNISONE 20 MG PO TABS
40.0000 mg | ORAL_TABLET | Freq: Once | ORAL | Status: AC
  Administered 2017-05-11: 40 mg via ORAL
  Filled 2017-05-11: qty 2

## 2017-05-11 MED ORDER — AEROCHAMBER PLUS FLO-VU LARGE MISC
1.0000 | Freq: Once | Status: AC
  Administered 2017-05-11: 1

## 2017-05-11 MED ORDER — ALBUTEROL SULFATE HFA 108 (90 BASE) MCG/ACT IN AERS
2.0000 | INHALATION_SPRAY | Freq: Once | RESPIRATORY_TRACT | Status: AC
  Administered 2017-05-11: 2 via RESPIRATORY_TRACT
  Filled 2017-05-11: qty 6.7

## 2017-05-11 MED ORDER — AZITHROMYCIN 250 MG PO TABS: 250.0000 mg | ORAL_TABLET | Freq: Every day | ORAL | 0 refills | Status: DC

## 2017-05-11 MED ORDER — PREDNISONE 10 MG PO TABS: 40.0000 mg | ORAL_TABLET | Freq: Every day | ORAL | 0 refills | Status: DC

## 2017-05-11 MED ORDER — IPRATROPIUM-ALBUTEROL 0.5-2.5 (3) MG/3ML IN SOLN
3.0000 mL | Freq: Once | RESPIRATORY_TRACT | Status: AC
  Administered 2017-05-11: 3 mL via RESPIRATORY_TRACT
  Filled 2017-05-11: qty 3

## 2017-05-11 NOTE — ED Notes (Signed)
Pt. Ambulated to BR with steady gait

## 2017-05-11 NOTE — ED Notes (Signed)
Pt remains in waiting room. Updated on wait for treatment room. 

## 2017-05-11 NOTE — ED Provider Notes (Signed)
[facility] DEPARTMENT Provider Note   CSN: [id number] Arrival date & time: 05/10/17  1441     History   Chief Complaint No chief complaint on file.   HPI Evan [name] is a 84 y.o. male.  The history is provided by the patient and a relative. No language interpreter was used.   Evan [name] is a 84 y.o. male who presents to the Emergency Department complaining of sob/chest pain.  He presents today for evaluation of left-sided chest pain, shortness of breath and cough.  He is experienced 2 weeks of progressive dyspnea on exertion now today with rattling cough that is nonproductive with central chest pain that is worse with coughing.  No reports of fevers, leg swelling or pain, nausea, vomiting.  Symptoms are moderate, worsening. Past Medical History:  Diagnosis Date  . Adenocarcinoma of right lung ([place]) 01/06/2016  . Anginal chest pain at rest [facility])    Chronic non, controlled on Lorazepam  . Anxiety   . Arthritis    "all over"   . BPH (benign prostatic hyperplasia)   . CAD (coronary artery disease)   . Chronic lower back pain   . Complication of anesthesia    "problems making water afterwards"  . Depression   . DJD (degenerative joint disease)   . Esophageal ulcer without bleeding    bid ppi indefinitely  . Family history of adverse reaction to anesthesia    "children w/PONV"  . GERD (gastroesophageal reflux disease)   . Headache    "couple/week maybe" (09/04/2015)  . Hemochromatosis    Possible, elevated iron stores, hook-like osteophytes on hand films, normal LFTs  . Hepatitis    "yellow jaundice as a baby"  . History of ASCVD    MULTIVESSEL  . Hyperlipidemia   . Hypertension   . Myocardial infarction ([place]) 1999  . Osteoarthritis   . Pneumonia    "several times; got a little now" (09/04/2015)  . Rheumatoid arthritis ([place])   . Subdural hematoma ([facility])    small after fall 09/2016-plavix held, neurosurgery consulted.  Asymptomatic.       Patient Active Problem List   Diagnosis Date Noted  . Acute pericarditis   . Chest pain at rest 12/20/2016  . Unstable angina ([place]) 12/20/2016  . Radiation pneumonitis ([place]) 11/09/2016  . Pleurisy 10/11/2016  . Chest wall pain 10/11/2016  . Atrial fibrillation ([place]) 05/21/2016  . Adenocarcinoma of right lung ([place]) 01/06/2016  . GAD (generalized anxiety disorder) 11/20/2015  . Essential hypertension   . Cellulitis 09/04/2015  . Cellulitis of left axilla   . Esophageal ulcer without bleeding   . Bilateral lower extremity edema 04/28/2014  . BPH (benign prostatic hyperplasia) 10/08/2012  . Anxiety   . EKG abnormalities 09/05/2012  . Tremor 09/05/2012  . Chronic fatigue 09/05/2012  . Arthritis   . Asthma, chronic   . Reflux esophagitis   . S/P CABG (coronary artery bypass graft)   . GERD (gastroesophageal reflux disease)   . Hyperlipidemia   . Hypertension   . UNSPECIFIED PERIPHERAL VASCULAR DISEASE 06/16/2009    Past Surgical History:  Procedure Laterality Date  . ARM SKIN LESION BIOPSY / EXCISION Left 09/02/2015  . CATARACT EXTRACTION W/ INTRAOCULAR LENS  IMPLANT, BILATERAL Bilateral   . CHOLECYSTECTOMY OPEN    . CORNEAL TRANSPLANT Bilateral    "one at [facility]; one at [facility]"  . CORONARY ANGIOPLASTY WITH STENT PLACEMENT    . CORONARY ARTERY BYPASS GRAFT  1999  .  DESCENDING AORTIC ANEURYSM REPAIR W/ STENT     DE stent ostium into the right radial free graft at OM-, 02-2007  . ESOPHAGOGASTRODUODENOSCOPY N/A 11/09/2014   Procedure: ESOPHAGOGASTRODUODENOSCOPY (EGD);  Surgeon: [name], MD;  Location: [facility] ENDOSCOPY;  Service: Endoscopy;  Laterality: N/A;  . INGUINAL HERNIA REPAIR    . JOINT REPLACEMENT    . NASAL SINUS SURGERY    . SHOULDER OPEN ROTATOR CUFF REPAIR Right   . TOTAL KNEE ARTHROPLASTY Bilateral        Home Medications    Prior to Admission medications   Medication Sig Start Date End Date Taking? Authorizing Provider  apixaban (ELIQUIS) 5 MG TABS tablet  Take 1 tablet (5 mg total) by mouth 2 (two) times daily. 01/31/17  Yes Jettie Booze, MD  atenolol (TENORMIN) 25 MG tablet Take 25 mg by mouth daily.   Yes [provider]  Cholecalciferol 2000 units TABS Take 1 tablet (2,000 Units total) by mouth daily. 02/04/16  Yes [name], MD  colchicine 0.6 MG tablet Take 1 tablet (0.6 mg total) by mouth daily. 04/11/17  Yes Jettie Booze, MD  Cyanocobalamin (VITAMIN B-12 PO) Take 1 tablet by mouth daily.   Yes [provider]  FLUoxetine (PROZAC) 20 MG capsule TAKE 20 mg  BY MOUTH DAILY 12/23/16  Yes Kilroy, Luke K, PA-C  ipratropium-albuterol (DUONEB) 0.5-2.5 (3) MG/3ML SOLN Take 3 mLs by nebulization every 4 (four) hours as needed (or Chest Pain / Tightness). 12/23/16  Yes Kilroy, Luke K, PA-C  Lifitegrast (XIIDRA) 5 % SOLN Place 1-2 drops into both eyes daily.   Yes [provider]  LORazepam (ATIVAN) 1 MG tablet TAKE ONE (1) TABLET BY MOUTH 3 TIMES DAILY 04/28/17  Yes [name], MD  nitroGLYCERIN (NITROSTAT) 0.4 MG SL tablet Place 1 tablet (0.4 mg total) under the tongue every 5 (five) minutes as needed for chest pain. 12/23/16  Yes Arbutus Leas, NP  pantoprazole (PROTONIX) 40 MG tablet Take one (1) tablet (40 mg) by mouth daily. 02/21/17  Yes Jettie Booze, MD  rosuvastatin (CRESTOR) 5 MG tablet TAKE 5 mg  EVERY DAY(NEED TO SCHEDULE YEARLY APPOINTMENT FOR AUGUST ) 12/23/16  Yes Kilroy, Doreene Burke, PA-C  tamsulosin (FLOMAX) 0.4 MG CAPS capsule TAKE 0.4 gm by mouth EVERY DAY 12/23/16  Yes Kilroy, Luke K, PA-C  azithromycin (ZITHROMAX) 250 MG tablet Take 1 tablet (250 mg total) by mouth daily. Take first 2 tablets together, then 1 every day until finished. 05/11/17   Quintella Reichert, MD  predniSONE (DELTASONE) 10 MG tablet Take 4 tablets (40 mg total) by mouth daily. 05/11/17   Quintella Reichert, MD  Spacer/Aero-Holding Chambers (AEROCHAMBER PLUS WITH MASK) inhaler Use as instructed 10/19/15   Charlesetta Shanks, MD     Family History Family History  Problem Relation Age of Onset  . Arthritis-Osteo Sister   . Heart attack Brother   . Heart attack Other   . Cancer Neg Hx     Social History Social History   Tobacco Use  . Smoking status: Never Smoker  . Smokeless tobacco: Former Systems developer    Types: Chew  . Tobacco comment: "quit chewing in the 1960s"  Substance Use Topics  . Alcohol use: No  . Drug use: No     Allergies   Feldene [piroxicam]; Statins; Doxycycline; Latex; Tequin; and Valium   Review of Systems Review of Systems  All other systems reviewed and are negative.    Physical Exam Updated  Vital Signs BP (!) 163/93   Pulse 67   Temp 98.5 F (36.9 C) (Oral)   Resp 18   SpO2 93%   Physical Exam  Constitutional: He is oriented to person, place, and time. He appears well-developed and well-nourished.  HENT:  Head: Normocephalic and atraumatic.  Cardiovascular: Normal rate and regular rhythm.  No murmur heard. Pulmonary/Chest: Effort normal. No respiratory distress.  Diffuse wheezing and rhonchi, fair air movement bilaterally  Abdominal: Soft. There is no tenderness. There is no rebound and no guarding.  Musculoskeletal: He exhibits no tenderness.  Nonpitting edema to bilateral lower extremities  Neurological: He is alert and oriented to person, place, and time.  Skin: Skin is warm and dry.  Psychiatric: He has a normal mood and affect. His behavior is normal.  Nursing note and vitals reviewed.    ED Treatments / Results  Labs (all labs ordered are listed, but only abnormal results are displayed) Labs Reviewed  BASIC METABOLIC PANEL - Abnormal; Notable for the following components:      Result Value   CO2 19 (*)    Creatinine, Ser 1.51 (*)    GFR calc non Af Amer 40 (*)    GFR calc Af Amer 46 (*)    All other components within normal limits  CBC - Abnormal; Notable for the following components:   Hemoglobin 12.9 (*)    Platelets 94 (*)    All other  components within normal limits  BRAIN NATRIURETIC PEPTIDE - Abnormal; Notable for the following components:   B Natriuretic Peptide 454.7 (*)    All other components within normal limits  D-DIMER, QUANTITATIVE (NOT AT [facility])  I-STAT TROPONIN, ED  I-STAT TROPONIN, ED    EKG  EKG Interpretation  Date/Time:  Wednesday May 10 2017 14:50:28 EST Ventricular Rate:  69 PR Interval:  228 QRS Duration: 90 QT Interval:  470 QTC Calculation: 503 R Axis:   -11 Text Interpretation:  Sinus rhythm with 1st degree A-V block with Premature supraventricular complexes Minimal voltage criteria for LVH, may be normal variant Septal infarct , age undetermined Baseline wander Prolonged QT Abnormal ECG Confirmed by Quintella Reichert [phone]) on 05/11/2017 3:36:31 AM       Radiology Dg Chest 2 View  Result Date: 05/10/2017 CLINICAL DATA:  Chest pain and cough.  Shortness of breath EXAM: CHEST  2 VIEW COMPARISON:  01/08/2017 FINDINGS: Prior median sternotomy and CABG procedure. Mild cardiac enlargement. No pleural effusion or edema. Persistent opacities within the posterior right lung base. Visualized osseous structures are unremarkable. IMPRESSION: 1. Persistent pulmonary opacities within the posterior right lung base compatible with residual/resolving pneumonia. Followup PA and lateral chest X-ray is recommended in 3-4 weeks following trial of antibiotic therapy to ensure resolution and exclude underlying malignancy. 2. Mild cardiac enlargement. Electronically Signed   By: Kerby Moors M.D.   On: 05/10/2017 16:17    Procedures Procedures (including critical care time)  Medications Ordered in ED Medications  predniSONE (DELTASONE) tablet 40 mg (40 mg Oral Given 05/11/17 0431)  ipratropium-albuterol (DUONEB) 0.5-2.5 (3) MG/3ML nebulizer solution 3 mL (3 mLs Nebulization Given 05/11/17 0431)  albuterol (PROVENTIL HFA;VENTOLIN HFA) 108 (90 Base) MCG/ACT inhaler 2 puff (2 puffs Inhalation Given 05/11/17 0626)   AEROCHAMBER PLUS FLO-VU LARGE MISC 1 each (1 each Other Given 05/11/17 2703)     Initial Impression / Assessment and Plan / ED Course  I have reviewed the triage vital signs and the nursing notes.  Pertinent labs & imaging results  that were available during my care of the patient were reviewed by me and considered in my medical decision making (see chart for details).     Pt w/ hx/o COPD, lung CA here for evaluation of SOB, chest pain.  He is nontoxic appearing on exam and in no acute distress.  Following treatment with albuterol in the ED he endorses feeling improved and repeat lung exam with improved air movement bilaterally, occasional end expiratory wheezes in the bases.  Presentation is not c/w ACS, PE, dissection, acute CHF or pna.  CXR with resolving pna vs chronic changes due to lung ca.  BNP is mildly elevated but improved compared to priors.  Discussed with pt home care for COPD exacerbation with steroids, continued breathing treatments, course of abx, outpatient follow up and return precautions.    Final Clinical Impressions(s) / ED Diagnoses   Final diagnoses:  COPD exacerbation [facility])    ED Discharge Orders        Ordered    predniSONE (DELTASONE) 10 MG tablet  Daily     05/11/17 0610    azithromycin (ZITHROMAX) 250 MG tablet  Daily     05/11/17 0610       Quintella Reichert, MD 05/11/17 [phone]

## 2017-05-15 ENCOUNTER — Ambulatory Visit: Payer: Medicare HMO | Admitting: Adult Health

## 2017-05-15 ENCOUNTER — Encounter: Payer: Self-pay | Admitting: Adult Health

## 2017-05-15 DIAGNOSIS — J209 Acute bronchitis, unspecified: Secondary | ICD-10-CM | POA: Insufficient documentation

## 2017-05-15 DIAGNOSIS — B370 Candidal stomatitis: Secondary | ICD-10-CM | POA: Diagnosis not present

## 2017-05-15 HISTORY — DX: Acute bronchitis, unspecified: J20.9

## 2017-05-15 MED ORDER — CLOTRIMAZOLE 10 MG MT TROC: 10.0000 mg | Freq: Every day | OROMUCOSAL | 0 refills | Status: DC

## 2017-05-15 NOTE — Assessment & Plan Note (Signed)
mycelex x 1 week   Plan  Patient Instructions  Finish Zithromax as directed Eat yogurt .  Mycelex troche five times daily for 1 week for thrush .  Mucinex DM twice daily as needed for cough and congestion Follow-up with Dr. Lamonte Sakai in 3 months and as needed Please contact office for sooner follow up if symptoms do not improve or worsen or seek emergency care ]

## 2017-05-15 NOTE — Assessment & Plan Note (Signed)
Recent flare now resolving  Pt is a never smoker with previous PFT (normal lung function w/ diffucing defect ). He has undergone XRT with radiation changes - suspect this along with age, comorbidities , deconditioning contribute to his dyspnea .  Hold on inhalers at this time .   Plan  . Patient Instructions  Evan [name] Zithromax as directed Eat yogurt .  Mycelex troche five times daily for 1 week for thrush .  Mucinex DM twice daily as needed for cough and congestion Follow-up with Dr. Lamonte Sakai in 3 months and as needed Please contact office for sooner follow up if symptoms do not improve or worsen or seek emergency care ]

## 2017-05-15 NOTE — Patient Instructions (Addendum)
Finish Zithromax as directed Eat yogurt .  Mycelex troche five times daily for 1 week for thrush .  Mucinex DM twice daily as needed for cough and congestion Follow-up with Dr. Lamonte Sakai in 3 months and as needed Please contact office for sooner follow up if symptoms do not improve or worsen or seek emergency care ]

## 2017-05-15 NOTE — Progress Notes (Signed)
@Patient  ID: Evan [name], male    DOB: 03/17/1928, 84 y.o.   MRN: [id number]  Chief Complaint  Patient presents with  . Follow-up    COPD     Referring provider: [name], MD  HPI: 84 yo male never smoker followed for Lung cancer -Adenocarcinoma of right lung 12/2015 s/p SBRT and suspected  Radiation Pneumonitis  RA on ARAVA .   TEST  PFT 01/05/16:FVC 3.26 L (111%) FEV1 2.61 L (131%) FEV1/FVC 0.80 FEF 25-75 2.60 L (214%) negative bronchodilator response TLC 5.47 L (90%) RV 86% ERV 401% DLCO corrected 46% 2018 Spirometry today show some mild restriction with FVC at 70%, FEV1 is 87%, ratio 85%  WALK TEST 10/11/16: Walked 1lap / Baseline Sat 100% on RA / Nadir Sat 100% on RA (stopped due to feeling his legs were weak)  IMAGING CT CHEST W/ CONTRAST 10/04/16 (personally reviewed by me):Trace left pleural effusion. Pleural thickening on the right noted. No pathologic mediastinal adenopathy. No pericardial effusion. Improvement in right basilar density compared with CT imaging performed in February. There is also decreased subpleural patchy groundglass opacities as well. No new or developing areas of opacification or groundglass. Questionable subpleural reticulation, primarily on the right. No obvious honeycombing or bronchiectasis appreciated.  PET CT 12/22/15 (per radiologist):  IMPRESSION: 1. Hypermetabolic (max SUV 3.9) posterior right lower lobe predominantly solid 2.9 cm pulmonary nodule, which is increased in size back to 2013, most consistent with primary bronchogenic adenocarcinoma. 2. Patchy subpleural reticulation throughout both lungs, basilar predominant, suspicious for interstitial lung disease, not well characterized on this study. Consider correlation with high-resolution chest CT in 6-12 months to assess temporal pattern stability, as clinically warranted. 3. Hypermetabolic bilateral hilar and mediastinal lymphadenopathy is highly nonspecific given the  suspected underlying interstitial lung disease, and could be reactive or metastatic. 4. Nonspecific hypermetabolic right level 2 neck lymph node, favor reactive node, cannot exclude metastasis. 5. No hypermetabolic osseous or abdominopelvic metastatic disease.  6. Mild hypermetabolism associated with subcutaneous fat stranding in the lateral right hip soft tissues, probably benign, correlate with any history of recent trauma or procedure in this location. 7. Relatively symmetric hypermetabolism in the bilateral glenohumeral joints with associated osteoarthritis, favor degenerative uptake. 8. Additional findings include mild cardiomegaly, aortic atherosclerosis, coronary atherosclerosis status post CABG and moderate sigmoid diverticulosis.  CARDIAC TTE (05/23/16):The normal in size with moderate concentric hypertrophy. EF 40-98%JXBJ normal diastolic function. LA moderately to severely dilated with RA normal in size. RV normal in size and function. Mild aortic stenosis with moderately calcified leaflets. Mild mitral regurgitation. Trivial pulmonic regurgitation. Mild tricuspid regurgitation.  PATHOLOGY CT-GUIDED BIOPSY RIGHT LOWER LOBE MASS (12/30/15):Adenocarcinoma  05/15/2017 Follow up ; ER follow up  Patient returns for an emergency room follow-up. Patient was seen in the emergency room on  February 7 for a bronchitic exacerbation.  He was given a Z-Pak and a prednisone taper.  Since then patient says he is feeling better. Cough is decreased .  No fever, hemoptysis , chest pain , edema.   Patient was last seen in the office in August 2018.  Patient has a history of adenocarcinoma with a right lower lobe lung mass diagnosed in September 2017.  He was treated with radiation, as patient was not felt to be a good candidate for lobectomy given his advanced age, limited mobility and chronic dyspnea.  At last visit patient was given a short steroid trial.  However patient did not return for follow-up.   Patient has  had surveillance CT chest.  Last April 20, 2017 showed a similar appearance of the posterior and medial airspace opacity along the right lung base.  The nodular component has decreased in size.  Stable mediastinal lymph nodes.   Allergies  Allergen Reactions  . Feldene [Piroxicam] Other (See Comments)    REACTION: Blisters  . Statins Other (See Comments)    Myalgias  . Doxycycline Other (See Comments)    REACTION:  unknown  . Latex Rash  . Tequin Anxiety  . Valium Other (See Comments)    REACTION:  unknown    Immunization History  Administered Date(s) Administered  . Influenza Whole 01/26/2007, 01/10/2008, 12/30/2009  . Influenza, High Dose Seasonal PF 12/23/2016  . Influenza,inj,Quad PF,6+ Mos 12/18/2012, 01/16/2015, 12/29/2015  . Pneumococcal Conjugate-13 06/24/2014  . Pneumococcal Polysaccharide-23 01/25/2000  . Td 12/27/2000, 03/09/2010  . Tdap 03/09/2010    Past Medical History:  Diagnosis Date  . Adenocarcinoma of right lung ([place]) 01/06/2016  . Anginal chest pain at rest [facility])    Chronic non, controlled on Lorazepam  . Anxiety   . Arthritis    "all over"   . BPH (benign prostatic hyperplasia)   . CAD (coronary artery disease)   . Chronic lower back pain   . Complication of anesthesia    "problems making water afterwards"  . Depression   . DJD (degenerative joint disease)   . Esophageal ulcer without bleeding    bid ppi indefinitely  . Family history of adverse reaction to anesthesia    "children w/PONV"  . GERD (gastroesophageal reflux disease)   . Headache    "couple/week maybe" (09/04/2015)  . Hemochromatosis    Possible, elevated iron stores, hook-like osteophytes on hand films, normal LFTs  . Hepatitis    "yellow jaundice as a baby"  . History of ASCVD    MULTIVESSEL  . Hyperlipidemia   . Hypertension   . Myocardial infarction ([place]) 1999  . Osteoarthritis   . Pneumonia    "several times; got a little now" (09/04/2015)  . Rheumatoid  arthritis ([place])   . Subdural hematoma ([facility])    small after fall 09/2016-plavix held, neurosurgery consulted.  Asymptomatic.      Tobacco History: Social History   Tobacco Use  Smoking Status Never Smoker  Smokeless Tobacco Former Systems developer  . Types: Chew  Tobacco Comment   "quit chewing in the 1960s"   Counseling given: Not Answered Comment: "quit chewing in the 1960s"   Outpatient Encounter Medications as of 05/15/2017  Medication Sig  . apixaban (ELIQUIS) 5 MG TABS tablet Take 1 tablet (5 mg total) by mouth 2 (two) times daily.  Marland Kitchen atenolol (TENORMIN) 25 MG tablet Take 25 mg by mouth daily.  Marland Kitchen azithromycin (ZITHROMAX) 250 MG tablet Take 1 tablet (250 mg total) by mouth daily. Take first 2 tablets together, then 1 every day until finished.  . Cholecalciferol 2000 units TABS Take 1 tablet (2,000 Units total) by mouth daily.  . colchicine 0.6 MG tablet Take 1 tablet (0.6 mg total) by mouth daily.  . Cyanocobalamin (VITAMIN B-12 PO) Take 1 tablet by mouth daily.  Marland Kitchen FLUoxetine (PROZAC) 20 MG capsule TAKE 20 mg  BY MOUTH DAILY  . ipratropium-albuterol (DUONEB) 0.5-2.5 (3) MG/3ML SOLN Take 3 mLs by nebulization every 4 (four) hours as needed (or Chest Pain / Tightness).  Marland Kitchen Lifitegrast (XIIDRA) 5 % SOLN Place 1-2 drops into both eyes daily.  Marland Kitchen LORazepam (ATIVAN) 1 MG tablet TAKE ONE (1) TABLET BY MOUTH  3 TIMES DAILY  . nitroGLYCERIN (NITROSTAT) 0.4 MG SL tablet Place 1 tablet (0.4 mg total) under the tongue every 5 (five) minutes as needed for chest pain.  . pantoprazole (PROTONIX) 40 MG tablet Take one (1) tablet (40 mg) by mouth daily.  . predniSONE (DELTASONE) 10 MG tablet Take 4 tablets (40 mg total) by mouth daily.  . rosuvastatin (CRESTOR) 5 MG tablet TAKE 5 mg  EVERY DAY(NEED TO SCHEDULE YEARLY APPOINTMENT FOR AUGUST )  . Spacer/Aero-Holding Chambers (AEROCHAMBER PLUS WITH MASK) inhaler Use as instructed  . tamsulosin (FLOMAX) 0.4 MG CAPS capsule TAKE 0.4 gm by mouth EVERY DAY  .  clotrimazole (MYCELEX) 10 MG troche Take 1 tablet (10 mg total) by mouth 5 (five) times daily.   No facility-administered encounter medications on file as of 05/15/2017.      Review of Systems  Constitutional:   No  weight loss, night sweats,  Fevers, chills,  +fatigue, or  lassitude.  HEENT:   No headaches,  Difficulty swallowing,  Tooth/dental problems, or  Sore throat,                No sneezing, itching, ear ache, nasal congestion, post nasal drip,   CV:  No chest pain,  Orthopnea, PND, swelling in lower extremities, anasarca, dizziness, palpitations, syncope.   GI  No heartburn, indigestion, abdominal pain, nausea, vomiting, diarrhea, change in bowel habits, loss of appetite, bloody stools.   Resp:  No chest wall deformity  Skin: no rash or lesions.  GU: no dysuria, change in color of urine, no urgency or frequency.  No flank pain, no hematuria   MS:  No joint pain or swelling.  No decreased range of motion.  No back pain.    Physical Exam  BP 140/76 (BP Location: Left Arm, Cuff Size: Normal)   Pulse 65   Ht 5\' 5"  (1.651 m)   Wt 175 lb 9.6 oz (79.7 kg)   SpO2 99%   BMI 29.22 kg/m   GEN: A/Ox3; pleasant , NAD , elderly , frail    HEENT:  /AT,  EACs-clear, TMs-wnl, NOSE-clear, THROAT-scattered white patches c/w thrush   NECK:  Supple w/ fair ROM; no JVD; normal carotid impulses w/o bruits; no thyromegaly or nodules palpated; no lymphadenopathy.    RESP  Few trace rhonchi ,  no accessory muscle use, no dullness to percussion  CARD:  RRR, no m/r/g, no peripheral edema, pulses intact, no cyanosis or clubbing.  GI:   Soft & nt; nml bowel sounds; no organomegaly or masses detected.   Musco: Warm bil, no deformities or joint swelling noted.   Neuro: alert, no focal deficits noted.    Skin: Warm, no lesions or rashes    Lab Results:   BMET  Imaging: Dg Chest 2 View  Result Date: 05/10/2017 CLINICAL DATA:  Chest pain and cough.  Shortness of breath EXAM:  CHEST  2 VIEW COMPARISON:  01/08/2017 FINDINGS: Prior median sternotomy and CABG procedure. Mild cardiac enlargement. No pleural effusion or edema. Persistent opacities within the posterior right lung base. Visualized osseous structures are unremarkable. IMPRESSION: 1. Persistent pulmonary opacities within the posterior right lung base compatible with residual/resolving pneumonia. Followup PA and lateral chest X-ray is recommended in 3-4 weeks following trial of antibiotic therapy to ensure resolution and exclude underlying malignancy. 2. Mild cardiac enlargement. Electronically Signed   By: Kerby Moors M.D.   On: 05/10/2017 16:17   Ct Chest W Contrast  Result Date: 04/20/2017 CLINICAL DATA:  Right-sided lung cancer. EXAM: CT CHEST WITH CONTRAST TECHNIQUE: Multidetector CT imaging of the chest was performed during intravenous contrast administration. CONTRAST:  75 cc ISOVUE 300 COMPARISON:  12/21/2016.  05/21/2016. FINDINGS: Cardiovascular: Heart is enlarged. Coronary artery calcification is evident. Patient is status post CABG. Mediastinum/Nodes: Stable appearance 10 mm right paratracheal lymph node. Other scattered small mediastinal lymph nodes are also stable. There is no hilar lymphadenopathy. There is no axillary lymphadenopathy. The esophagus has normal imaging features. Lungs/Pleura: Centrilobular emphysema. Fine lung detail obscured by breathing motion. Similar appearance of the interstitial and airspace opacity in the medial right lower lobe extending into the costophrenic sulcus. The nodular component measured previously at 3.5 cm is now 1.2 cm. Stable volume loss left hemithorax without focal airspace consolidation or pulmonary edema. Calcified granuloma identified posterior lung base. Upper Abdomen: Unremarkable. Musculoskeletal: Bone windows reveal no worrisome lytic or sclerotic osseous lesions. IMPRESSION: Similar appearance of the posterior and medial interstitial and airspace opacity in the  right lung base. The nodular component measured on the previous study has decreased in the interval. Stable scattered mediastinal lymph nodes. Emphysema. (ICD10-J43.9). Aortic Atherosclerois (ICD10-170.0) Electronically Signed   By: Misty Stanley M.D.   On: 04/20/2017 19:48     Assessment & Plan:   No problem-specific Assessment & Plan notes found for this encounter.     Rexene Edison, NP 05/15/2017

## 2017-06-04 DIAGNOSIS — R0602 Shortness of breath: Secondary | ICD-10-CM | POA: Diagnosis not present

## 2017-06-04 DIAGNOSIS — R296 Repeated falls: Secondary | ICD-10-CM | POA: Diagnosis not present

## 2017-06-04 DIAGNOSIS — J449 Chronic obstructive pulmonary disease, unspecified: Secondary | ICD-10-CM | POA: Diagnosis not present

## 2017-06-04 DIAGNOSIS — R269 Unspecified abnormalities of gait and mobility: Secondary | ICD-10-CM | POA: Diagnosis not present

## 2017-06-04 DIAGNOSIS — J4520 Mild intermittent asthma, uncomplicated: Secondary | ICD-10-CM | POA: Diagnosis not present

## 2017-06-13 ENCOUNTER — Ambulatory Visit: Payer: Medicare HMO | Admitting: Pulmonary Disease

## 2017-06-14 ENCOUNTER — Ambulatory Visit (INDEPENDENT_AMBULATORY_CARE_PROVIDER_SITE_OTHER)
Admission: RE | Admit: 2017-06-14 | Discharge: 2017-06-14 | Disposition: A | Discharge status: Home / Self Care | Payer: Medicare HMO | Source: Ambulatory Visit | Attending: Pulmonary Disease | Admitting: Pulmonary Disease

## 2017-06-14 ENCOUNTER — Other Ambulatory Visit (INDEPENDENT_AMBULATORY_CARE_PROVIDER_SITE_OTHER): Payer: Medicare HMO

## 2017-06-14 ENCOUNTER — Ambulatory Visit: Payer: Medicare HMO | Admitting: Physician Assistant

## 2017-06-14 ENCOUNTER — Ambulatory Visit: Payer: Medicare HMO | Admitting: Pulmonary Disease

## 2017-06-14 ENCOUNTER — Encounter: Payer: Self-pay | Admitting: Pulmonary Disease

## 2017-06-14 VITALS — BP 134/72 | HR 66 | Temp 97.5°F | Ht 65.0 in | Wt 173.0 lb

## 2017-06-14 DIAGNOSIS — J700 Acute pulmonary manifestations due to radiation: Secondary | ICD-10-CM | POA: Diagnosis not present

## 2017-06-14 DIAGNOSIS — R0602 Shortness of breath: Secondary | ICD-10-CM | POA: Diagnosis not present

## 2017-06-14 LAB — BASIC METABOLIC PANEL
BUN: 24 mg/dL — ABNORMAL HIGH (ref 6–23)
CO2: 24 mEq/L (ref 19–32)
Calcium: 9.6 mg/dL (ref 8.4–10.5)
Chloride: 106 mEq/L (ref 96–112)
Creatinine, Ser: 1.15 mg/dL (ref 0.40–1.50)
GFR: 63.82 mL/min (ref >60.00)
Glucose, Bld: 91 mg/dL (ref 70–99)
Potassium: 4.2 mEq/L (ref 3.5–5.1)
Sodium: 139 mEq/L (ref 135–145)

## 2017-06-14 LAB — BRAIN NATRIURETIC PEPTIDE: Pro B Natriuretic peptide (BNP): 823 pg/mL — ABNORMAL HIGH (ref 0.0–100.0)

## 2017-06-14 MED ORDER — PREDNISONE 20 MG PO TABS: 20.0000 mg | ORAL_TABLET | Freq: Every day | ORAL | 0 refills | Status: AC

## 2017-06-14 NOTE — Patient Instructions (Signed)
Shortness of breath: I am going to give you a course of prednisone again because this seems to be helpful take 20 mg daily for 5 days Chest x-ray Blood work to look for evidence of a cardiac problem: ProBNP Call us if this is not helpful We will plan to see you next week to go over these results and try to get to the bottom of what is going on

## 2017-06-14 NOTE — Progress Notes (Signed)
Subjective:    Patient ID: Evan [name], male    DOB: 24-May-1927, 84 y.o.   MRN: [id number]  HPI  Chief Complaint  Patient presents with  . Acute Visit    former JN pt c/o worsening SOB at rest and with exertion X1 day.  denies cough, cp, mucus production.     Synopsis: Former patient of Dr. Ashok Cordia who has COPD and adenocarcinoma of the right lung  "I can't breathe" > has been going on for 6-8 weeks at least > he has been in and out of the ER and our office > he last saw Tammy here who gave him some prednisone > two days ago his symptoms started again and were much worse, he needed to take albuterol three times in one night which is when he feels worse and that's a lot for him > climbing stairs is hard > he will sometimes cough up some phlegm, this is rare > he doesn't have any pain in his chest > he hasn't noticed any ankle swelling    Past Medical History:  Diagnosis Date  . Adenocarcinoma of right lung ([place]) 01/06/2016  . Anginal chest pain at rest [facility])    Chronic non, controlled on Lorazepam  . Anxiety   . Arthritis    "all over"   . BPH (benign prostatic hyperplasia)   . CAD (coronary artery disease)   . Chronic lower back pain   . Complication of anesthesia    "problems making water afterwards"  . Depression   . DJD (degenerative joint disease)   . Esophageal ulcer without bleeding    bid ppi indefinitely  . Family history of adverse reaction to anesthesia    "children w/PONV"  . GERD (gastroesophageal reflux disease)   . Headache    "couple/week maybe" (09/04/2015)  . Hemochromatosis    Possible, elevated iron stores, hook-like osteophytes on hand films, normal LFTs  . Hepatitis    "yellow jaundice as a baby"  . History of ASCVD    MULTIVESSEL  . Hyperlipidemia   . Hypertension   . Myocardial infarction ([place]) 1999  . Osteoarthritis   . Pneumonia    "several times; got a little now" (09/04/2015)  . Rheumatoid arthritis ([place])   . Subdural hematoma ([facility])     small after fall 09/2016-plavix held, neurosurgery consulted.  Asymptomatic.       Review of Systems  Constitutional: Positive for fatigue. Negative for diaphoresis and fever.  HENT: Negative for rhinorrhea, sinus pressure and sinus pain.   Respiratory: Positive for cough and chest tightness. Negative for shortness of breath.   Cardiovascular: Negative for chest pain, palpitations and leg swelling.       Objective:   Physical Exam Vitals:   06/14/17 0925  BP: 134/72  Pulse: 66  Temp: (!) 97.5 F (36.4 C)  TempSrc: Oral  SpO2: 97%  Weight: 173 lb (78.5 kg)  Height: 5\' 5"  (1.651 m)   Gen: well appearing HENT: OP clear, TM's clear, neck supple PULM: Crackles RLL B, normal percussion CV: RRR, no mgr, trace edema GI: BS+, soft, nontender Derm: no cyanosis or rash Psyche: normal mood and affect   PFT 01/05/16:FVC 3.26 L (111%) FEV1 2.61 L (131%) FEV1/FVC 0.80 FEF 25-75 2.60 L (214%) negative bronchodilator response TLC 5.47 L (90%) RV 86% ERV 401% DLCO corrected 46% 2018 Spirometry today show some mild restriction with FVC at 70%, FEV1 is 87%, ratio 85%  WALK TEST 10/11/16: Walked 1lap / Baseline Sat  100% on RA / Nadir Sat 100% on RA (stopped due to feeling his legs were weak)  IMAGING CT CHEST W/ CONTRAST 10/04/16 (personally reviewed by me):Trace left pleural effusion. Pleural thickening on the right noted. No pathologic mediastinal adenopathy. No pericardial effusion. Improvement in right basilar density compared with CT imaging performed in February. There is also decreased subpleural patchy groundglass opacities as well. No new or developing areas of opacification or groundglass. Questionable subpleural reticulation, primarily on the right. No obvious honeycombing or bronchiectasis appreciated.  PET CT 12/22/15 (per radiologist):  IMPRESSION: 1. Hypermetabolic (max SUV 3.9) posterior right lower lobe predominantly solid 2.9 cm pulmonary nodule, which is increased  in size back to 2013, most consistent with primary bronchogenic adenocarcinoma. 2. Patchy subpleural reticulation throughout both lungs, basilar predominant, suspicious for interstitial lung disease, not well characterized on this study. Consider correlation with high-resolution chest CT in 6-12 months to assess temporal pattern stability, as clinically warranted. 3. Hypermetabolic bilateral hilar and mediastinal lymphadenopathy is highly nonspecific given the suspected underlying interstitial lung disease, and could be reactive or metastatic. 4. Nonspecific hypermetabolic right level 2 neck lymph node, favor reactive node, cannot exclude metastasis. 5. No hypermetabolic osseous or abdominopelvic metastatic disease.  6. Mild hypermetabolism associated with subcutaneous fat stranding in the lateral right hip soft tissues, probably benign, correlate with any history of recent trauma or procedure in this location. 7. Relatively symmetric hypermetabolism in the bilateral glenohumeral joints with associated osteoarthritis, favor degenerative uptake. 8. Additional findings include mild cardiomegaly, aortic atherosclerosis, coronary atherosclerosis status post CABG and moderate sigmoid diverticulosis.  CARDIAC TTE (05/23/16):The normal in size with moderate concentric hypertrophy. EF 38-46%KZLD normal diastolic function. LA moderately to severely dilated with RA normal in size. RV normal in size and function. Mild aortic stenosis with moderately calcified leaflets. Mild mitral regurgitation. Trivial pulmonic regurgitation. Mild tricuspid regurgitation.  PATHOLOGY CT-GUIDED BIOPSY RIGHT LOWER LOBE MASS (12/30/15):Adenocarcinoma  Records reviewed from his visit with [place] recently where he was treated with prednisone and a Z-Pak for presumed bronchitis     Assessment & Plan:    Shortness of breath - Plan: DG Chest 2 View, Basic Metabolic Panel (BMET), B Nat Peptide  Radiation pneumonitis  ([place])  Discussion: Squire presents with shortness of breath and some chest tightness.  To be honest it is not clear to me what is going on because he does not have airflow obstruction nor do I appreciate significant emphysema on the CT scan of his chest.  So I do not feel that he is having a COPD exacerbation.  Given his near isolated complaint of shortness of breath with orthopnea I do question whether or not he could have some degree of heart failure or hopefully no recurrence of his prior pericarditis.  His physical exam is not consistent with volume overload otherwise.  Because prednisone has been helpful in the past I am going to start that again but we need to get a workup to try to get to the bottom of what is going on and then I want him to come back and see [place] closely.  If he still having the same symptoms next week we may need to consider having him go back to cardiology to evaluate for underlying cardiac disease again.  I suppose he could have radiation pneumonitis but have never seen this present with isolated dyspnea and very little cough like he has been experiencing.  Plan: Shortness of breath: I am going to give you a course of prednisone  again because this seems to be helpful take 20 mg daily for 5 days Chest x-ray Blood work to look for evidence of a cardiac problem: ProBNP Call us if this is not helpful We will plan to see you next week to go over these results and try to get to the bottom of what is going on     Current Outpatient Medications:  .  apixaban (ELIQUIS) 5 MG TABS tablet, Take 1 tablet (5 mg total) by mouth 2 (two) times daily., Disp: 60 tablet, Rfl: 11 .  atenolol (TENORMIN) 25 MG tablet, Take 25 mg by mouth daily., Disp: , Rfl:  .  Cholecalciferol 2000 units TABS, Take 1 tablet (2,000 Units total) by mouth daily., Disp: 30 each, Rfl: 11 .  colchicine 0.6 MG tablet, Take 1 tablet (0.6 mg total) by mouth daily., Disp: 30 tablet, Rfl: 11 .  Cyanocobalamin (VITAMIN  B-12 PO), Take 1 tablet by mouth daily., Disp: , Rfl:  .  FLUoxetine (PROZAC) 20 MG capsule, TAKE 20 mg  BY MOUTH DAILY, Disp: 90 capsule, Rfl: 1 .  ipratropium-albuterol (DUONEB) 0.5-2.5 (3) MG/3ML SOLN, Take 3 mLs by nebulization every 4 (four) hours as needed (or Chest Pain / Tightness)., Disp: 360 mL, Rfl: 3 .  Lifitegrast (XIIDRA) 5 % SOLN, Place 1-2 drops into both eyes daily., Disp: , Rfl:  .  LORazepam (ATIVAN) 1 MG tablet, TAKE ONE (1) TABLET BY MOUTH 3 TIMES DAILY, Disp: 90 tablet, Rfl: 1 .  nitroGLYCERIN (NITROSTAT) 0.4 MG SL tablet, Place 1 tablet (0.4 mg total) under the tongue every 5 (five) minutes as needed for chest pain., Disp: 25 tablet, Rfl: 2 .  pantoprazole (PROTONIX) 40 MG tablet, Take one (1) tablet (40 mg) by mouth daily., Disp: 90 tablet, Rfl: 3 .  rosuvastatin (CRESTOR) 5 MG tablet, TAKE 5 mg  EVERY DAY(NEED TO SCHEDULE YEARLY APPOINTMENT FOR AUGUST ), Disp: 90 tablet, Rfl: 3 .  Spacer/Aero-Holding Chambers (AEROCHAMBER PLUS WITH MASK) inhaler, Use as instructed, Disp: 1 each, Rfl: 2 .  tamsulosin (FLOMAX) 0.4 MG CAPS capsule, TAKE 0.4 gm by mouth EVERY DAY, Disp: 90 capsule, Rfl: 3 .  predniSONE (DELTASONE) 20 MG tablet, Take 1 tablet (20 mg total) by mouth daily with breakfast for 5 days., Disp: 5 tablet, Rfl: 0

## 2017-06-15 ENCOUNTER — Other Ambulatory Visit: Payer: Self-pay

## 2017-06-15 MED ORDER — FUROSEMIDE 40 MG PO TABS: 40.0000 mg | ORAL_TABLET | Freq: Every day | ORAL | 0 refills | Status: DC

## 2017-06-21 ENCOUNTER — Encounter: Payer: Self-pay | Admitting: Acute Care

## 2017-06-21 ENCOUNTER — Other Ambulatory Visit (INDEPENDENT_AMBULATORY_CARE_PROVIDER_SITE_OTHER): Payer: Medicare HMO

## 2017-06-21 ENCOUNTER — Ambulatory Visit: Payer: Medicare HMO | Admitting: Acute Care

## 2017-06-21 DIAGNOSIS — R0600 Dyspnea, unspecified: Secondary | ICD-10-CM | POA: Insufficient documentation

## 2017-06-21 LAB — BRAIN NATRIURETIC PEPTIDE: Pro B Natriuretic peptide (BNP): 666 pg/mL — ABNORMAL HIGH (ref 0.0–100.0)

## 2017-06-21 NOTE — Addendum Note (Signed)
Addended by: Jannette Spanner on: 06/21/2017 04:12 PM   Modules accepted: Orders

## 2017-06-21 NOTE — Patient Instructions (Addendum)
It is nice to meet you today We will check a BMET, BN{P today to check your kidney function. Continue your Duonebs as needed  Remember to stick to a low salt diet  Follow up with Dr. Irish Lack cardiology re: lasix prn Follow up with Dr. [place] Bells in 3 months Please contact office for sooner follow up if symptoms do not improve or worsen or seek emergency care

## 2017-06-21 NOTE — Progress Notes (Signed)
History of Present Illness Evan [name] is a 84 y.o. male with COPD and history of Adenocarcinoma of the right lung. He was previously followed by Dr. Ashok Cordia.  Synopsis: Former patient of Dr. Ashok Cordia who has COPD and adenocarcinoma of the right lung   06/21/2017  Pt. Presents for 1 week follow up after seeing Dr. Lake Bells 06/14/2017 for worsening dyspnea which had been ongoing for about 6 weeks. Plan after that visit was a course of prednisone  20 mg daily for 5 days, CXR, and  Pro BNP were ordered.Pt. Is returning to evaluate labs to determine if they help with determination of etiology of dyspnea. Diagnostics are as noted below. Elevated BNP and CXR with evidence of vascular congestion.  Pt. Was prescribed Lasix in addition to prednisone x 5 days. Pt. Presents today stating he is much better.He was compliant with his prednisone and lasix.He states the medication helped. He has not had to use his Duonebs at all. He is sleeping through the night. . He states he had good response to his lasix, but he cannot tell if the Lasix or the prednisone was what helped the most..He has no cough or sputum production. He denies fever, chest pain, orthopnea or hemoptysis.   BNP 06/14/2017>> 823>> Dr. Lake Bells called in Lasix 40 x 5 days to see if this would help. BMET 06/14/2017>> K= 4.2, Creatinine= 1.15, BUN = 24 CXR 3/13>> IMPRESSION: Increase reticular opacities and pleural fluid tracking along fissures probably representing interstitial edema. Stable right lung base opacity probably representing sequelae of prior pneumonia.   Test Results: PFT 01/05/16:FVC 3.26 L (111%) FEV1 2.61 L (131%) FEV1/FVC 0.80 FEF 25-75 2.60 L (214%) negative bronchodilator response TLC 5.47 L (90%) RV 86% ERV 401% DLCO corrected 46% 2018Spirometry today show some mild restriction with FVC at 70%, FEV1 is 87%, ratio 85%  WALK TEST 10/11/16: Walked 1lap / Baseline Sat 100% on RA / Nadir Sat 100% on RA (stopped due to  feeling his legs were weak)  IMAGING CT CHEST W/ CONTRAST 10/04/16 (personally reviewed by me):Trace left pleural effusion. Pleural thickening on the right noted. No pathologic mediastinal adenopathy. No pericardial effusion. Improvement in right basilar density compared with CT imaging performed in February. There is also decreased subpleural patchy groundglass opacities as well. No new or developing areas of opacification or groundglass. Questionable subpleural reticulation, primarily on the right. No obvious honeycombing or bronchiectasis appreciated.  PET CT 12/22/15 (per radiologist):  IMPRESSION: 1. Hypermetabolic (max SUV 3.9) posterior right lower lobe predominantly solid 2.9 cm pulmonary nodule, which is increased in size back to 2013, most consistent with primary bronchogenic adenocarcinoma. 2. Patchy subpleural reticulation throughout both lungs, basilar predominant, suspicious for interstitial lung disease, not well characterized on this study. Consider correlation with high-resolution chest CT in 6-12 months to assess temporal pattern stability, as clinically warranted. 3. Hypermetabolic bilateral hilar and mediastinal lymphadenopathy is highly nonspecific given the suspected underlying interstitial lung disease, and could be reactive or metastatic. 4. Nonspecific hypermetabolic right level 2 neck lymph node, favor reactive node, cannot exclude metastasis. 5. No hypermetabolic osseous or abdominopelvic metastatic disease.  6. Mild hypermetabolism associated with subcutaneous fat stranding in the lateral right hip soft tissues, probably benign, correlate with any history of recent trauma or procedure in this location. 7. Relatively symmetric hypermetabolism in the bilateral glenohumeral joints with associated osteoarthritis, favor degenerative uptake. 8. Additional findings include mild cardiomegaly, aortic atherosclerosis, coronary atherosclerosis status post CABG and moderate sigmoid  diverticulosis.  CARDIAC  TTE (05/23/16):The normal in size with moderate concentric hypertrophy. EF 11-94%RDEY normal diastolic function. LA moderately to severely dilated with RA normal in size. RV normal in size and function. Mild aortic stenosis with moderately calcified leaflets. Mild mitral regurgitation. Trivial pulmonic regurgitation. Mild tricuspid regurgitation.  PATHOLOGY CT-GUIDED BIOPSY RIGHT LOWER LOBE MASS (12/30/15):Adenocarcinoma>> Follows with Dr. Julien Nordmann. Stage 1A. Patient underwent SBRT ending on 02/22/16 per records.   Records reviewed from his visit with [place] recently where he was treated with prednisone and a Z-Pak for presumed bronchitis       CBC Latest Ref Rng & Units 05/10/2017 01/17/2017 01/08/2017  WBC 4.0 - 10.5 K/uL 6.0 5.5 4.5  Hemoglobin 13.0 - 17.0 g/dL 12.9(L) 12.4(L) 11.8(L)  Hematocrit 39.0 - 52.0 % 39.6 36.8(L) 36.1(L)  Platelets 150 - 400 K/uL 94(L) 99(L) 78(L)    BMP Latest Ref Rng & Units 06/14/2017 05/10/2017 04/20/2017  Glucose 70 - 99 mg/dL 91 93 100  BUN 6 - 23 mg/dL 24(H) 16 17  Creatinine 0.40 - 1.50 mg/dL 1.15 1.51(H) 1.34(H)  BUN/Creat Ratio 6 - 22 (calc) - - -  Sodium 135 - 145 mEq/L 139 138 139  Potassium 3.5 - 5.1 mEq/L 4.2 4.5 4.7  Chloride 96 - 112 mEq/L 106 109 106  CO2 19 - 32 mEq/L 24 19(L) 23  Calcium 8.4 - 10.5 mg/dL 9.6 9.0 9.3    BNP    Component Value Date/Time   BNP 454.7 (H) 05/11/2017 0352    ProBNP    Component Value Date/Time   PROBNP 823.0 (H) 06/14/2017 1010    PFT    Component Value Date/Time   FEV1PRE 2.61 01/05/2016 0939   FEV1POST 2.58 01/05/2016 0939   FVCPRE 3.26 01/05/2016 0939   FVCPOST 3.23 01/05/2016 0939   TLC 5.47 01/05/2016 0939   DLCOUNC 11.48 01/05/2016 0939   PREFEV1FVCRT 80 01/05/2016 0939   PSTFEV1FVCRT 80 01/05/2016 0939    Dg Chest 2 View  Result Date: 06/14/2017 CLINICAL DATA:  84 y/o M; 1 month of shortness of breath worsening in the last 2 days. History of right lung  cancer. EXAM: CHEST - 2 VIEW COMPARISON:  05/10/2017 chest radiograph.  04/20/2017 CT chest. FINDINGS: Stable cardiac silhouette given projection and technique. Aortic atherosclerosis with calcification. Interval increase in reticular opacities and trace pleural fluid tracking along fissures. Stable opacity right posterior lung base. Post median sternotomy with wires in alignment. No acute osseous abnormality identified. IMPRESSION: Increase reticular opacities and pleural fluid tracking along fissures probably representing interstitial edema. Stable right lung base opacity probably representing sequelae of prior pneumonia. Electronically Signed   By: Kristine Garbe M.D.   On: 06/14/2017 14:49     Past medical hx Past Medical History:  Diagnosis Date  . Adenocarcinoma of right lung ([place]) 01/06/2016  . Anginal chest pain at rest [facility])    Chronic non, controlled on Lorazepam  . Anxiety   . Arthritis    "all over"   . BPH (benign prostatic hyperplasia)   . CAD (coronary artery disease)   . Chronic lower back pain   . Complication of anesthesia    "problems making water afterwards"  . Depression   . DJD (degenerative joint disease)   . Esophageal ulcer without bleeding    bid ppi indefinitely  . Family history of adverse reaction to anesthesia    "children w/PONV"  . GERD (gastroesophageal reflux disease)   . Headache    "couple/week maybe" (09/04/2015)  . Hemochromatosis  Possible, elevated iron stores, hook-like osteophytes on hand films, normal LFTs  . Hepatitis    "yellow jaundice as a baby"  . History of ASCVD    MULTIVESSEL  . Hyperlipidemia   . Hypertension   . Myocardial infarction ([place]) 1999  . Osteoarthritis   . Pneumonia    "several times; got a little now" (09/04/2015)  . Rheumatoid arthritis ([place])   . Subdural hematoma (HCC)    small after fall 09/2016-plavix held, neurosurgery consulted.  Asymptomatic.       Social History   Tobacco Use  . Smoking  status: Never Smoker  . Smokeless tobacco: Former Systems developer    Types: Chew  . Tobacco comment: "quit chewing in the 1960s"  Substance Use Topics  . Alcohol use: No  . Drug use: No    Mr.Sahli reports that  has never smoked. He has quit using smokeless tobacco. His smokeless tobacco use included chew. He reports that he does not drink alcohol or use drugs.  Tobacco Cessation: Never smoker  Past surgical hx, Family hx, Social hx all reviewed.  Current Outpatient Medications on File Prior to Visit  Medication Sig  . apixaban (ELIQUIS) 5 MG TABS tablet Take 1 tablet (5 mg total) by mouth 2 (two) times daily.  Marland Kitchen atenolol (TENORMIN) 25 MG tablet Take 25 mg by mouth daily.  . Cholecalciferol 2000 units TABS Take 1 tablet (2,000 Units total) by mouth daily.  . colchicine 0.6 MG tablet Take 1 tablet (0.6 mg total) by mouth daily.  . Cyanocobalamin (VITAMIN B-12 PO) Take 1 tablet by mouth daily.  Marland Kitchen FLUoxetine (PROZAC) 20 MG capsule TAKE 20 mg  BY MOUTH DAILY  . ipratropium-albuterol (DUONEB) 0.5-2.5 (3) MG/3ML SOLN Take 3 mLs by nebulization every 4 (four) hours as needed (or Chest Pain / Tightness).  Marland Kitchen Lifitegrast (XIIDRA) 5 % SOLN Place 1-2 drops into both eyes daily.  Marland Kitchen LORazepam (ATIVAN) 1 MG tablet TAKE ONE (1) TABLET BY MOUTH 3 TIMES DAILY  . nitroGLYCERIN (NITROSTAT) 0.4 MG SL tablet Place 1 tablet (0.4 mg total) under the tongue every 5 (five) minutes as needed for chest pain.  . pantoprazole (PROTONIX) 40 MG tablet Take one (1) tablet (40 mg) by mouth daily.  . rosuvastatin (CRESTOR) 5 MG tablet TAKE 5 mg  EVERY DAY(NEED TO SCHEDULE YEARLY APPOINTMENT FOR AUGUST )  . Spacer/Aero-Holding Chambers (AEROCHAMBER PLUS WITH MASK) inhaler Use as instructed  . tamsulosin (FLOMAX) 0.4 MG CAPS capsule TAKE 0.4 gm by mouth EVERY DAY  . furosemide (LASIX) 40 MG tablet Take 1 tablet (40 mg total) by mouth daily. (Patient not taking: Reported on 06/21/2017)   No current facility-administered medications  on file prior to visit.      Allergies  Allergen Reactions  . Feldene [Piroxicam] Other (See Comments)    REACTION: Blisters  . Statins Other (See Comments)    Myalgias  . Doxycycline Other (See Comments)    REACTION:  unknown  . Latex Rash  . Tequin Anxiety  . Valium Other (See Comments)    REACTION:  unknown    Review Of Systems:  Constitutional:   No  weight loss, night sweats,  Fevers, chills, fatigue, or  lassitude.  HEENT:   No headaches,  Difficulty swallowing,  Tooth/dental problems, or  Sore throat,                No sneezing, itching, ear ache, nasal congestion, post nasal drip,   CV:  No chest pain,  Orthopnea,  PND, swelling in lower extremities, anasarca, dizziness, palpitations, syncope.   GI  No heartburn, indigestion, abdominal pain, nausea, vomiting, diarrhea, change in bowel habits, loss of appetite, bloody stools.   Resp: No shortness of breath with exertion or at rest.  No excess mucus, no productive cough,  No non-productive cough,  No coughing up of blood.  No change in color of mucus.  No wheezing.  No chest wall deformity  Skin: no rash or lesions.  GU: no dysuria, change in color of urine, no urgency or frequency.  No flank pain, no hematuria   MS:  No joint pain or swelling.  No decreased range of motion.  No back pain.  Psych:  No change in mood or affect. No depression or anxiety.  No memory loss.   Vital Signs BP 128/70 (BP Location: Right Arm, Cuff Size: Normal)   Pulse 67   Ht 5\' 5"  (1.651 m)   Wt 172 lb 3.2 oz (78.1 kg)   SpO2 96%   BMI 28.66 kg/m    Physical Exam:  General- No distress,  A&Ox3,pleasant ENT: No sinus tenderness, TM clear, pale nasal mucosa, no oral exudate,no post nasal drip, no LAN, HOH Cardiac: S1, S2, regular rate and rhythm, no murmur Chest: No wheeze/ rales/ dullness; no accessory muscle use, no nasal flaring, no sternal retractions Abd.: Soft Non-tender, ND, BS + Ext: No clubbing cyanosis, edema Neuro:   Deconditioned at baseline, MAE x 4, A&O x 3 Skin: No rashes, warm and dry Psych: normal mood and behavior   Assessment/Plan  Dyspnea Resolved  after Lasxi 40 mg x 5 days and Pred taper Plan: We will check a BMET, BNP today to check your kidney function, and heart function. Continue your Duonebs as needed  Remember to stick to a low salt diet  Follow up with Dr. Irish Lack cardiology re: lasix prn Follow up with Dr. Lake Bells in 3 months Please contact office for sooner follow up if symptoms do not improve or worsen or seek emergency care     Magdalen Spatz, NP 06/21/2017  4:07 PM

## 2017-06-21 NOTE — Assessment & Plan Note (Signed)
Resolved  after Lasxi 40 mg x 5 days and Pred taper Plan: We will check a BMET, BNP today to check your kidney function, and heart function. Continue your Duonebs as needed  Remember to stick to a low salt diet  Follow up with Dr. Irish Lack cardiology re: lasix prn Follow up with Dr. Lake Bells in 3 months Please contact office for sooner follow up if symptoms do not improve or worsen or seek emergency care

## 2017-06-22 LAB — BASIC METABOLIC PANEL
BUN: 34 mg/dL — ABNORMAL HIGH (ref 6–23)
CO2: 24 mEq/L (ref 19–32)
Calcium: 9.9 mg/dL (ref 8.4–10.5)
Chloride: 105 mEq/L (ref 96–112)
Creatinine, Ser: 1.28 mg/dL (ref 0.40–1.50)
GFR: 56.4 mL/min — ABNORMAL LOW (ref >60.00)
Glucose, Bld: 101 mg/dL — ABNORMAL HIGH (ref 70–99)
Potassium: 4.3 mEq/L (ref 3.5–5.1)
Sodium: 139 mEq/L (ref 135–145)

## 2017-06-26 ENCOUNTER — Ambulatory Visit: Payer: Medicare HMO | Admitting: Adult Health

## 2017-06-26 ENCOUNTER — Inpatient Hospital Stay (HOSPITAL_COMMUNITY)
Admission: EM | Admit: 2017-06-26 | Discharge: 2017-06-28 | DRG: 287 | Disposition: A | Discharge status: Home / Self Care | Payer: Medicare HMO | Attending: Interventional Cardiology | Admitting: Interventional Cardiology

## 2017-06-26 ENCOUNTER — Encounter: Payer: Self-pay | Admitting: Adult Health

## 2017-06-26 ENCOUNTER — Encounter (HOSPITAL_COMMUNITY): Payer: Self-pay | Admitting: Neurology

## 2017-06-26 ENCOUNTER — Telehealth: Payer: Self-pay | Admitting: Interventional Cardiology

## 2017-06-26 ENCOUNTER — Emergency Department (HOSPITAL_COMMUNITY): Payer: Medicare HMO

## 2017-06-26 VITALS — BP 128/80 | Ht 65.0 in | Wt 176.8 lb

## 2017-06-26 DIAGNOSIS — I5032 Chronic diastolic (congestive) heart failure: Secondary | ICD-10-CM

## 2017-06-26 DIAGNOSIS — M069 Rheumatoid arthritis, unspecified: Secondary | ICD-10-CM | POA: Diagnosis present

## 2017-06-26 DIAGNOSIS — I110 Hypertensive heart disease with heart failure: Secondary | ICD-10-CM | POA: Diagnosis present

## 2017-06-26 DIAGNOSIS — Z951 Presence of aortocoronary bypass graft: Secondary | ICD-10-CM | POA: Diagnosis not present

## 2017-06-26 DIAGNOSIS — Z7901 Long term (current) use of anticoagulants: Secondary | ICD-10-CM | POA: Diagnosis not present

## 2017-06-26 DIAGNOSIS — I5030 Unspecified diastolic (congestive) heart failure: Secondary | ICD-10-CM | POA: Insufficient documentation

## 2017-06-26 DIAGNOSIS — R079 Chest pain, unspecified: Secondary | ICD-10-CM

## 2017-06-26 DIAGNOSIS — I252 Old myocardial infarction: Secondary | ICD-10-CM

## 2017-06-26 DIAGNOSIS — I214 Non-ST elevation (NSTEMI) myocardial infarction: Secondary | ICD-10-CM | POA: Diagnosis not present

## 2017-06-26 DIAGNOSIS — Z955 Presence of coronary angioplasty implant and graft: Secondary | ICD-10-CM

## 2017-06-26 DIAGNOSIS — I2510 Atherosclerotic heart disease of native coronary artery without angina pectoris: Secondary | ICD-10-CM | POA: Diagnosis not present

## 2017-06-26 DIAGNOSIS — I319 Disease of pericardium, unspecified: Secondary | ICD-10-CM

## 2017-06-26 DIAGNOSIS — I480 Paroxysmal atrial fibrillation: Secondary | ICD-10-CM | POA: Diagnosis not present

## 2017-06-26 DIAGNOSIS — I25119 Atherosclerotic heart disease of native coronary artery with unspecified angina pectoris: Principal | ICD-10-CM | POA: Diagnosis present

## 2017-06-26 DIAGNOSIS — Z96653 Presence of artificial knee joint, bilateral: Secondary | ICD-10-CM | POA: Diagnosis present

## 2017-06-26 DIAGNOSIS — Z885 Allergy status to narcotic agent status: Secondary | ICD-10-CM

## 2017-06-26 DIAGNOSIS — I200 Unstable angina: Secondary | ICD-10-CM | POA: Diagnosis not present

## 2017-06-26 DIAGNOSIS — R0602 Shortness of breath: Secondary | ICD-10-CM | POA: Diagnosis not present

## 2017-06-26 DIAGNOSIS — I25110 Atherosclerotic heart disease of native coronary artery with unstable angina pectoris: Secondary | ICD-10-CM | POA: Diagnosis not present

## 2017-06-26 DIAGNOSIS — D696 Thrombocytopenia, unspecified: Secondary | ICD-10-CM | POA: Diagnosis present

## 2017-06-26 DIAGNOSIS — F419 Anxiety disorder, unspecified: Secondary | ICD-10-CM | POA: Diagnosis present

## 2017-06-26 DIAGNOSIS — I5020 Unspecified systolic (congestive) heart failure: Secondary | ICD-10-CM | POA: Diagnosis present

## 2017-06-26 DIAGNOSIS — I2582 Chronic total occlusion of coronary artery: Secondary | ICD-10-CM | POA: Diagnosis present

## 2017-06-26 DIAGNOSIS — I25810 Atherosclerosis of coronary artery bypass graft(s) without angina pectoris: Secondary | ICD-10-CM

## 2017-06-26 DIAGNOSIS — Z91040 Latex allergy status: Secondary | ICD-10-CM | POA: Diagnosis not present

## 2017-06-26 DIAGNOSIS — I209 Angina pectoris, unspecified: Secondary | ICD-10-CM

## 2017-06-26 DIAGNOSIS — Z79899 Other long term (current) drug therapy: Secondary | ICD-10-CM | POA: Diagnosis not present

## 2017-06-26 DIAGNOSIS — R0600 Dyspnea, unspecified: Secondary | ICD-10-CM | POA: Diagnosis present

## 2017-06-26 DIAGNOSIS — Z85118 Personal history of other malignant neoplasm of bronchus and lung: Secondary | ICD-10-CM

## 2017-06-26 DIAGNOSIS — Z888 Allergy status to other drugs, medicaments and biological substances status: Secondary | ICD-10-CM | POA: Diagnosis not present

## 2017-06-26 DIAGNOSIS — I509 Heart failure, unspecified: Secondary | ICD-10-CM | POA: Diagnosis not present

## 2017-06-26 DIAGNOSIS — K219 Gastro-esophageal reflux disease without esophagitis: Secondary | ICD-10-CM | POA: Diagnosis present

## 2017-06-26 DIAGNOSIS — D649 Anemia, unspecified: Secondary | ICD-10-CM | POA: Diagnosis not present

## 2017-06-26 DIAGNOSIS — Z923 Personal history of irradiation: Secondary | ICD-10-CM

## 2017-06-26 DIAGNOSIS — J449 Chronic obstructive pulmonary disease, unspecified: Secondary | ICD-10-CM | POA: Diagnosis present

## 2017-06-26 DIAGNOSIS — E785 Hyperlipidemia, unspecified: Secondary | ICD-10-CM | POA: Diagnosis present

## 2017-06-26 DIAGNOSIS — N400 Enlarged prostate without lower urinary tract symptoms: Secondary | ICD-10-CM | POA: Diagnosis not present

## 2017-06-26 LAB — BASIC METABOLIC PANEL
Anion gap: 11 (ref 5–15)
BUN: 13 mg/dL (ref 6–20)
CO2: 19 mmol/L — ABNORMAL LOW (ref 22–32)
Calcium: 8.7 mg/dL — ABNORMAL LOW (ref 8.9–10.3)
Chloride: 106 mmol/L (ref 101–111)
Creatinine, Ser: 0.98 mg/dL (ref 0.61–1.24)
GFR calc Af Amer: >60 mL/min (ref >60)
GFR calc non Af Amer: >60 mL/min (ref >60)
Glucose, Bld: 99 mg/dL (ref 65–99)
Potassium: 5.3 mmol/L — ABNORMAL HIGH (ref 3.5–5.1)
Sodium: 136 mmol/L (ref 135–145)

## 2017-06-26 LAB — TROPONIN I
Troponin I: 0.05 ng/mL (ref <0.03)
Troponin I: 0.06 ng/mL (ref <0.03)

## 2017-06-26 LAB — PROTIME-INR
INR: 1.23
Prothrombin Time: 15.4 seconds — ABNORMAL HIGH (ref 11.4–15.2)

## 2017-06-26 LAB — APTT: aPTT: 37 seconds — ABNORMAL HIGH (ref 24–36)

## 2017-06-26 LAB — CBC
HCT: 37.5 % — ABNORMAL LOW (ref 39.0–52.0)
Hemoglobin: 12.2 g/dL — ABNORMAL LOW (ref 13.0–17.0)
MCH: 30.8 pg (ref 26.0–34.0)
MCHC: 32.5 g/dL (ref 30.0–36.0)
MCV: 94.7 fL (ref 78.0–100.0)
Platelets: 93 10*3/uL — ABNORMAL LOW (ref 150–400)
RBC: 3.96 MIL/uL — ABNORMAL LOW (ref 4.22–5.81)
RDW: 14.9 % (ref 11.5–15.5)
WBC: 6.6 10*3/uL (ref 4.0–10.5)

## 2017-06-26 LAB — I-STAT TROPONIN, ED: Troponin i, poc: 0.01 ng/mL (ref 0.00–0.08)

## 2017-06-26 LAB — MAGNESIUM: Magnesium: 2 mg/dL (ref 1.7–2.4)

## 2017-06-26 MED ORDER — ATENOLOL 25 MG PO TABS
25.0000 mg | ORAL_TABLET | Freq: Every day | ORAL | Status: DC
  Administered 2017-06-26 – 2017-06-27 (×2): 25 mg via ORAL
  Filled 2017-06-26 (×3): qty 1

## 2017-06-26 MED ORDER — POLYVINYL ALCOHOL 1.4 % OP SOLN
1.0000 [drp] | Freq: Every day | OPHTHALMIC | Status: DC | PRN
  Administered 2017-06-26: 2 [drp] via OPHTHALMIC
  Filled 2017-06-26: qty 15

## 2017-06-26 MED ORDER — IPRATROPIUM-ALBUTEROL 0.5-2.5 (3) MG/3ML IN SOLN: 3.0000 mL | RESPIRATORY_TRACT | Status: DC | PRN

## 2017-06-26 MED ORDER — NITROGLYCERIN 0.4 MG SL SUBL: 0.4000 mg | SUBLINGUAL_TABLET | SUBLINGUAL | Status: DC | PRN

## 2017-06-26 MED ORDER — TAMSULOSIN HCL 0.4 MG PO CAPS
0.4000 mg | ORAL_CAPSULE | Freq: Every day | ORAL | Status: DC
  Administered 2017-06-26 – 2017-06-28 (×3): 0.4 mg via ORAL
  Filled 2017-06-26 (×3): qty 1

## 2017-06-26 MED ORDER — COLCHICINE 0.6 MG PO TABS
0.6000 mg | ORAL_TABLET | Freq: Every day | ORAL | Status: DC
  Administered 2017-06-26 – 2017-06-28 (×3): 0.6 mg via ORAL
  Filled 2017-06-26 (×3): qty 1

## 2017-06-26 MED ORDER — ROSUVASTATIN CALCIUM 10 MG PO TABS
5.0000 mg | ORAL_TABLET | Freq: Every day | ORAL | Status: DC
  Administered 2017-06-27: 5 mg via ORAL
  Filled 2017-06-26: qty 1

## 2017-06-26 MED ORDER — ENOXAPARIN SODIUM 40 MG/0.4ML ~~LOC~~ SOLN
40.0000 mg | SUBCUTANEOUS | Status: DC
  Administered 2017-06-27: 40 mg via SUBCUTANEOUS
  Filled 2017-06-26: qty 0.4

## 2017-06-26 MED ORDER — FLUOXETINE HCL 20 MG PO CAPS
20.0000 mg | ORAL_CAPSULE | Freq: Every day | ORAL | Status: DC
  Administered 2017-06-26 – 2017-06-28 (×3): 20 mg via ORAL
  Filled 2017-06-26 (×4): qty 1

## 2017-06-26 MED ORDER — LIFITEGRAST 5 % OP SOLN: 1.0000 [drp] | Freq: Every day | OPHTHALMIC | Status: DC

## 2017-06-26 MED ORDER — LORAZEPAM 1 MG PO TABS
1.0000 mg | ORAL_TABLET | Freq: Three times a day (TID) | ORAL | Status: DC | PRN
  Administered 2017-06-26 – 2017-06-28 (×3): 1 mg via ORAL
  Filled 2017-06-26 (×3): qty 1

## 2017-06-26 MED ORDER — PANTOPRAZOLE SODIUM 40 MG PO TBEC
40.0000 mg | DELAYED_RELEASE_TABLET | Freq: Every day | ORAL | Status: DC
  Administered 2017-06-26 – 2017-06-28 (×3): 40 mg via ORAL
  Filled 2017-06-26 (×3): qty 1

## 2017-06-26 NOTE — Assessment & Plan Note (Signed)
Pt with recurrent chest tightness , chest pain, dyspnea with worsening x 3 days . Known underlying CAD and EKG changes . Pt will need transfer to ER for further evaluation /rule out ACS .  Pt was placed on O2 at 2l/m . Declined Aspirin EMS called and with transfer to ER . Pt stable for transfer .   Plan  Go to ER via EMS .

## 2017-06-26 NOTE — ED Provider Notes (Signed)
[place] [facility] EMERGENCY DEPARTMENT Provider Note   CSN: [id number] Arrival date & time: 06/26/17  1143     History   Chief Complaint Chief Complaint  Patient presents with  . Chest Pain    HPI Evan [name] is a 84 y.o. male.  HPI  Pleasant 84 year old male presenting with a complaint of chest pain  This has been intermittent over the last couple of weeks, feels a heaviness and a squeezing that is associated with some shortness of breath.  He denies any fevers coughing or swelling of the legs.  He has been seen by his family doctor today at a primary care office where he had an EKG done which was abnormal and he was redirected to the emergency department by ambulance.  At this time the patient denies any chest pain that is active, he states that it mostly occurs at night but sometimes during the day and sometimes with exertion.  He has a prior history of coronary bypass grafting and denies any anticoagulant use though he was given 4 aspirins on the ambulance.  Review of the medical record shows that the patient last had a heart catheterization in 2009 showing patent stents and grafts, had a low risk nuclear stress test in 2017 and was hospitalized in September 2018 with pericarditis.  Past Medical History:  Diagnosis Date  . Adenocarcinoma of right lung ([place]) 01/06/2016  . Anginal chest pain at rest [facility])    Chronic non, controlled on Lorazepam  . Anxiety   . Arthritis    "all over"   . BPH (benign prostatic hyperplasia)   . CAD (coronary artery disease)   . Chronic lower back pain   . Complication of anesthesia    "problems making water afterwards"  . Depression   . DJD (degenerative joint disease)   . Esophageal ulcer without bleeding    bid ppi indefinitely  . Family history of adverse reaction to anesthesia    "children w/PONV"  . GERD (gastroesophageal reflux disease)   . Headache    "couple/week maybe" (09/04/2015)  . Hemochromatosis    Possible, elevated iron stores, hook-like osteophytes on hand films, normal LFTs  . Hepatitis    "yellow jaundice as a baby"  . History of ASCVD    MULTIVESSEL  . Hyperlipidemia   . Hypertension   . Myocardial infarction ([place]) 1999  . Osteoarthritis   . Pneumonia    "several times; got a little now" (09/04/2015)  . Rheumatoid arthritis ([place])   . Subdural hematoma (H[facility])    small after fall 09/2016-plavix held, neurosurgery consulted.  Asymptomatic.      Patient Active Problem List   Diagnosis Date Noted  . Diastolic CHF ([place]) 34/74/2595  . Dyspnea 06/21/2017  . Acute bronchitis 05/15/2017  . Oral candidiasis 05/15/2017  . Acute pericarditis   . Chest pain at rest 12/20/2016  . Unstable angina ([place]) 12/20/2016  . Radiation pneumonitis ([place]) 11/09/2016  . Pleurisy 10/11/2016  . Chest wall pain 10/11/2016  . Atrial fibrillation ([place]) 05/21/2016  . Adenocarcinoma of right lung ([place]) 01/06/2016  . GAD (generalized anxiety disorder) 11/20/2015  . Essential hypertension   . Cellulitis 09/04/2015  . Cellulitis of left axilla   . Esophageal ulcer without bleeding   . Bilateral lower extremity edema 04/28/2014  . BPH (benign prostatic hyperplasia) 10/08/2012  . Anxiety   . EKG abnormalities 09/05/2012  . Tremor 09/05/2012  . Chronic fatigue 09/05/2012  . Arthritis   . Asthma, chronic   .  Reflux esophagitis   . S/P CABG (coronary artery bypass graft)   . GERD (gastroesophageal reflux disease)   . Hyperlipidemia   . Hypertension   . UNSPECIFIED PERIPHERAL VASCULAR DISEASE 06/16/2009    Past Surgical History:  Procedure Laterality Date  . ARM SKIN LESION BIOPSY / EXCISION Left 09/02/2015  . CATARACT EXTRACTION W/ INTRAOCULAR LENS  IMPLANT, BILATERAL Bilateral   . CHOLECYSTECTOMY OPEN    . CORNEAL TRANSPLANT Bilateral    "one at [facility]; [facility]"  . CORONARY ANGIOPLASTY WITH STENT PLACEMENT    . CORONARY ARTERY BYPASS GRAFT  1999  . DESCENDING AORTIC ANEURYSM REPAIR W/ STENT      DE stent ostium into the right radial free graft at OM-, 02-2007  . ESOPHAGOGASTRODUODENOSCOPY N/A 11/09/2014   Procedure: ESOPHAGOGASTRODUODENOSCOPY (EGD);  Surgeon: [name], MD;  Location: [facility] ENDOSCOPY;  Service: Endoscopy;  Laterality: N/A;  . INGUINAL HERNIA REPAIR    . JOINT REPLACEMENT    . NASAL SINUS SURGERY    . SHOULDER OPEN ROTATOR CUFF REPAIR Right   . TOTAL KNEE ARTHROPLASTY Bilateral         Home Medications    Prior to Admission medications   Medication Sig Start Date End Date Taking? Authorizing Provider  acetaminophen (TYLENOL) 500 MG tablet Take 500 mg by mouth every 6 (six) hours as needed for mild pain.   Yes [provider]  apixaban (ELIQUIS) 5 MG TABS tablet Take 1 tablet (5 mg total) by mouth 2 (two) times daily. 01/31/17  Yes Jettie Booze, MD  atenolol (TENORMIN) 25 MG tablet Take 25 mg by mouth daily.   Yes [provider]  Cholecalciferol 2000 units TABS Take 1 tablet (2,000 Units total) by mouth daily. 02/04/16  Yes [name], MD  colchicine 0.6 MG tablet Take 1 tablet (0.6 mg total) by mouth daily. 04/11/17  Yes Jettie Booze, MD  Cyanocobalamin (VITAMIN B-12 PO) Take 1 tablet by mouth daily.   Yes [provider]  FLUoxetine (PROZAC) 20 MG capsule TAKE 20 mg  BY MOUTH DAILY 12/23/16  Yes Kilroy, Luke K, PA-C  ipratropium-albuterol (DUONEB) 0.5-2.5 (3) MG/3ML SOLN Take 3 mLs by nebulization every 4 (four) hours as needed (or Chest Pain / Tightness). 12/23/16  Yes Kilroy, Luke K, PA-C  Lifitegrast (XIIDRA) 5 % SOLN Place 1-2 drops into both eyes daily.   Yes [provider]  LORazepam (ATIVAN) 1 MG tablet TAKE ONE (1) TABLET BY MOUTH 3 TIMES DAILY 04/28/17  Yes [name], MD  nitroGLYCERIN (NITROSTAT) 0.4 MG SL tablet Place 1 tablet (0.4 mg total) under the tongue every 5 (five) minutes as needed for chest pain. 12/23/16  Yes Arbutus Leas, NP  pantoprazole (PROTONIX) 40 MG tablet Take one (1)  tablet (40 mg) by mouth daily. 02/21/17  Yes Jettie Booze, MD  rosuvastatin (CRESTOR) 5 MG tablet TAKE 5 mg  EVERY DAY(NEED TO SCHEDULE YEARLY APPOINTMENT FOR AUGUST ) 12/23/16  Yes Kilroy, Doreene Burke, PA-C  Spacer/Aero-Holding Chambers (AEROCHAMBER PLUS WITH MASK) inhaler Use as instructed 10/19/15  Yes Charlesetta Shanks, MD  tamsulosin (FLOMAX) 0.4 MG CAPS capsule TAKE 0.4 gm by mouth EVERY DAY 12/23/16  Yes Kilroy, Luke K, PA-C  furosemide (LASIX) 40 MG tablet Take 1 tablet (40 mg total) by mouth daily. Patient not taking: Reported on 06/21/2017 06/15/17 06/15/18  Juanito Doom, MD    Family History Family History  Problem Relation Age of Onset  . Arthritis-Osteo Sister   .  Heart attack Brother   . Heart attack Other   . Cancer Neg Hx     Social History Social History   Tobacco Use  . Smoking status: Never Smoker  . Smokeless tobacco: Former Systems developer    Types: Chew  . Tobacco comment: "quit chewing in the 1960s"  Substance Use Topics  . Alcohol use: No  . Drug use: No     Allergies   Feldene [piroxicam]; Statins; Doxycycline; Latex; Tequin; and Valium   Review of Systems Review of Systems  All other systems reviewed and are negative.    Physical Exam Updated Vital Signs BP (!) 147/78   Pulse 78   Temp 98 F (36.7 C) (Oral)   Resp (!) 21   Ht 5\' 6"  (1.676 m)   Wt 78.5 kg (173 lb)   SpO2 96%   BMI 27.92 kg/m   Physical Exam  Constitutional: He appears well-developed and well-nourished. No distress.  HENT:  Head: Normocephalic and atraumatic.  Mouth/Throat: Oropharynx is clear and moist. No oropharyngeal exudate.  Eyes: Pupils are equal, round, and reactive to light. Conjunctivae and EOM are normal. Right eye exhibits no discharge. Left eye exhibits no discharge. No scleral icterus.  Neck: Normal range of motion. Neck supple. No JVD present. No thyromegaly present.  Cardiovascular: Normal rate, regular rhythm, normal heart sounds and intact distal pulses.  Exam reveals no gallop and no friction rub.  No murmur heard. Pulmonary/Chest: Effort normal and breath sounds normal. No respiratory distress. He has no wheezes. He has no rales.  Abdominal: Soft. Bowel sounds are normal. He exhibits no distension and no mass. There is no tenderness.  Musculoskeletal: Normal range of motion. He exhibits no edema or tenderness.  Lymphadenopathy:    He has no cervical adenopathy.  Neurological: He is alert. Coordination normal.  Skin: Skin is warm and dry. No rash noted. No erythema.  Psychiatric: He has a normal mood and affect. His behavior is normal.  Nursing note and vitals reviewed.    ED Treatments / Results  Labs (all labs ordered are listed, but only abnormal results are displayed) Labs Reviewed  BASIC METABOLIC PANEL - Abnormal; Notable for the following components:      Result Value   Potassium 5.3 (*)    CO2 19 (*)    Calcium 8.7 (*)    All other components within normal limits  CBC - Abnormal; Notable for the following components:   RBC 3.96 (*)    Hemoglobin 12.2 (*)    HCT 37.5 (*)    Platelets 93 (*)    All other components within normal limits  I-STAT TROPONIN, ED    EKG EKG Interpretation  Date/Time:  Monday June 26 2017 11:56:00 EDT Ventricular Rate:  64 PR Interval:  214 QRS Duration: 90 QT Interval:  462 QTC Calculation: 476 R Axis:   -2 Text Interpretation:  Sinus rhythm with 1st degree A-V block ST & T wave abnormality, consider lateral ischemia Prolonged QT Abnormal ECG Since last tracing T wave abnormality NOW PRESENT Confirmed by Noemi Chapel [phone]) on 06/26/2017 11:59:34 AM   Radiology Dg Chest Port 1 View  Result Date: 06/26/2017 CLINICAL DATA:  Shortness of breath, chest pain EXAM: PORTABLE CHEST 1 VIEW COMPARISON:  06/14/2017 FINDINGS: Prior CABG. Cardiomegaly. Probable scarring in the lingula and lung bases. No definite acute process. No effusions. No acute bony abnormality. IMPRESSION: Suspect lingular  and bibasilar scarring. Cardiomegaly. No active disease. Electronically Signed   By: Rolm Baptise  M.D.   On: 06/26/2017 13:58    Procedures Procedures (including critical care time)  Medications Ordered in ED Medications - No data to display   Initial Impression / Assessment and Plan / ED Course  I have reviewed the triage vital signs and the nursing notes.  Pertinent labs & imaging results that were available during my care of the patient were reviewed by me and considered in my medical decision making (see chart for details).    Abnormal ECG  T wave abnormalities Cardiac monitoring,  Labs, nitroglycerin as needed  Will discuss with cardiology, primary cardiologist is Dr. Irish Lack  Labs reviewed, troponin is unremarkable however the patient is chest pain-free at this time.  Potassium is 5.3 but it was noted to be hemolyzed, creatinine is normal, blood counts are unremarkable  Discussed with cardiology at 1:13 PM, they will see the patient and determine whether he will be on a cardiology service or a hospitalist service.  Cardiology will admit the patient  Final Clinical Impressions(s) / ED Diagnoses   Final diagnoses:  Angina pectoris (HCC)      Noemi Chapel, MD 06/26/17 1510

## 2017-06-26 NOTE — ED Notes (Signed)
Placed pt on 2L of O2 for comfort

## 2017-06-26 NOTE — ED Notes (Signed)
Given coke and graham crackers, awaiting meal tray.

## 2017-06-26 NOTE — Consult Note (Addendum)
The patient has been seen in conjunction with Kathi Ludwig, MD. All aspects of care have been considered and discussed. The patient has been personally interviewed, examined, and all clinical data has been reviewed.   Patient has history of paroxysmal atrial fibrillation with last dose of Eliquis this morning.  Was seen in pulmonology and sent to the emergency room because of concern for ACS.  Has a personal prior history of coronary grafting with arterial conduits in 1998.  Grafts were documented to be patent by angiography in 2009.  Over the past 3-4 months he has been troubled by recurring chest discomfort and progressive dyspnea..  There are clinical features of pericarditis with improvement in chest pain on anti-inflammatory therapy including colchicine.  Major presenting complaint is chest discomfort and dyspnea with activity which can also awaken the patient from sleep.  No pericardial friction rub is heard.  ECG reveals diffuse ST-T wave abnormality.  Concern at this time is for the possibility of ACS versus nonischemic chest pain syndrome such as recurrent pericarditis.  Plan to perform echocardiography as the patient has a prior diagnosis of pericarditis and is on chronic anticoagulation therapy with Eliquis.  We need to exclude pericardial effusion.  Cardiac markers will be cycled.  Coronary angiography tomorrow afternoon when he will be greater than 24 hours out from last dose of Eliquis.  Procedure would need to be done as a radial approach case from left arm.      CARDIOLOGY CONSULT NOTE    Patient ID: Evan [name]; [id number]; 05-Jan-1928   Admit date: 06/26/2017 Date of Consult: 06/26/2017 Evan [name] Primary Care Provider: [name], MD Primary Cardiologist: Dr. Irish Lack  Patient Profile:   Evan [name] is a 84 y.o. male with PMHx notable for CAD s/p CABG x2 in 1998 and cath 2009 w/ patent stents, adenocarcinoma of the right lung, 12/2015 s/p  SBRT and suspected radiation pneumonitis, HTN, HLD, BPH, and possible COPD.   History of Present Illness:   Evan [name] is an 84 year old male who presents to [facility] ED with several days of progressively worsening left-sided substernal chest pain and dyspnea. The dyspnea is worse laying flat for prolonged periods of time and improved notably with his home duoneb breathing treatments. Walking decreases the chest pain but increases the dyspnea. The patient attested to such symptoms times 2 months.  He was prescribed a course of prednisone for symptoms which dramatically improved the dyspnea but following completion of the prednisone the dyspnea again occurred and progressively worsened.  At a visit with his primary pulmonologist office this a.m. An EKG  was performed which was concerning for possible ST segment changes prompting recommendation for an ED visit for evaluation of his dyspnea and chest pain. The patient is typically able to walk without chest pain at baseline and notes that the dyspnea is near 3am each day after lying flat for several hours.  In the ED the patient's i-STAT troponin was 0.01, EKG was remarkable only for minimal ST depression in the inferolateral leads.  Chest x-ray was read as possible suspected lingular and bibasilar scarring with cardiomegaly no active disease, however, there is notable increased opacity of the left lower lobe compared to previous imaging completed on 06/14/2017.  CBC was remarkable for mild thrombocytopenia of 93.K, and mild anemia of 12.2.  BMP was remarkable for sodium 136 and hemolyzed specimen indicating potassium 5.3.  Patient's most recent BMP in 5 days prior was noted to be elevated above  360.  Past Medical History:  Diagnosis Date  . Adenocarcinoma of right lung ([place]) 01/06/2016  . Anginal chest pain at rest [facility])    Chronic non, controlled on Lorazepam  . Anxiety   . Arthritis    "all over"   . BPH (benign prostatic hyperplasia)   . CAD  (coronary artery disease)   . Chronic lower back pain   . Complication of anesthesia    "problems making water afterwards"  . Depression   . DJD (degenerative joint disease)   . Esophageal ulcer without bleeding    bid ppi indefinitely  . Family history of adverse reaction to anesthesia    "children w/PONV"  . GERD (gastroesophageal reflux disease)   . Headache    "couple/week maybe" (09/04/2015)  . Hemochromatosis    Possible, elevated iron stores, hook-like osteophytes on hand films, normal LFTs  . Hepatitis    "yellow jaundice as a baby"  . History of ASCVD    MULTIVESSEL  . Hyperlipidemia   . Hypertension   . Myocardial infarction ([place]) 1999  . Osteoarthritis   . Pneumonia    "several times; got a little now" (09/04/2015)  . Rheumatoid arthritis ([place])   . Subdural hematoma ([facility])    small after fall 09/2016-plavix held, neurosurgery consulted.  Asymptomatic.      Past Surgical History:  Procedure Laterality Date  . ARM SKIN LESION BIOPSY / EXCISION Left 09/02/2015  . CATARACT EXTRACTION W/ INTRAOCULAR LENS  IMPLANT, BILATERAL Bilateral   . CHOLECYSTECTOMY OPEN    . CORNEAL TRANSPLANT Bilateral    "[facility]; one at [facility]"  . CORONARY ANGIOPLASTY WITH STENT PLACEMENT    . CORONARY ARTERY BYPASS GRAFT  1999  . DESCENDING AORTIC ANEURYSM REPAIR W/ STENT     DE stent ostium into the right radial free graft at OM-, 02-2007  . ESOPHAGOGASTRODUODENOSCOPY N/A 11/09/2014   Procedure: ESOPHAGOGASTRODUODENOSCOPY (EGD);  Surgeon: [name], MD;  Location: [facility] ENDOSCOPY;  Service: Endoscopy;  Laterality: N/A;  . INGUINAL HERNIA REPAIR    . JOINT REPLACEMENT    . NASAL SINUS SURGERY    . SHOULDER OPEN ROTATOR CUFF REPAIR Right   . TOTAL KNEE ARTHROPLASTY Bilateral      Home Medications:  Prior to Admission medications   Medication Sig Start Date End Date Taking? Authorizing Provider  apixaban (ELIQUIS) 5 MG TABS tablet Take 1 tablet (5 mg total) by mouth 2 (two) times daily.  01/31/17   Jettie Booze, MD  atenolol (TENORMIN) 25 MG tablet Take 25 mg by mouth daily.    [provider]  Cholecalciferol 2000 units TABS Take 1 tablet (2,000 Units total) by mouth daily. 02/04/16   [name], MD  colchicine 0.6 MG tablet Take 1 tablet (0.6 mg total) by mouth daily. 04/11/17   Jettie Booze, MD  Cyanocobalamin (VITAMIN B-12 PO) Take 1 tablet by mouth daily.    [provider]  FLUoxetine (PROZAC) 20 MG capsule TAKE 20 mg  BY MOUTH DAILY 12/23/16   Erlene Quan, PA-C  furosemide (LASIX) 40 MG tablet Take 1 tablet (40 mg total) by mouth daily. Patient not taking: Reported on 06/21/2017 06/15/17 06/15/18  Juanito Doom, MD  ipratropium-albuterol (DUONEB) 0.5-2.5 (3) MG/3ML SOLN Take 3 mLs by nebulization every 4 (four) hours as needed (or Chest Pain / Tightness). 12/23/16   Kilroy, Doreene Burke, PA-C  Lifitegrast Shirley Friar) 5 % SOLN Place 1-2 drops into both eyes daily.    [provider]  LORazepam (ATIVAN) 1 MG tablet TAKE ONE (1) TABLET BY MOUTH 3 TIMES DAILY 04/28/17   [name], MD  nitroGLYCERIN (NITROSTAT) 0.4 MG SL tablet Place 1 tablet (0.4 mg total) under the tongue every 5 (five) minutes as needed for chest pain. 12/23/16   Arbutus Leas, NP  pantoprazole (PROTONIX) 40 MG tablet Take one (1) tablet (40 mg) by mouth daily. 02/21/17   Jettie Booze, MD  rosuvastatin (CRESTOR) 5 MG tablet TAKE 5 mg  EVERY DAY(NEED TO SCHEDULE YEARLY APPOINTMENT FOR AUGUST ) 12/23/16   Erlene Quan, PA-C  Spacer/Aero-Holding Chambers (AEROCHAMBER PLUS WITH MASK) inhaler Use as instructed 10/19/15   Charlesetta Shanks, MD  tamsulosin (FLOMAX) 0.4 MG CAPS capsule TAKE 0.4 gm by mouth EVERY DAY 12/23/16   Erlene Quan, PA-C    Inpatient Medications: Scheduled Meds:  Continuous Infusions:  PRN Meds:   Allergies:    Allergies  Allergen Reactions  . Feldene [Piroxicam] Other (See Comments)    REACTION: Blisters  . Statins Other (See  Comments)    Myalgias  . Doxycycline Other (See Comments)    REACTION:  unknown  . Latex Rash  . Tequin Anxiety  . Valium Other (See Comments)    REACTION:  unknown    Social History:   Social History   Socioeconomic History  . Marital status: Widowed    Spouse name: Not on file  . Number of children: Not on file  . Years of education: Not on file  . Highest education level: Not on file  Occupational History  . Not on file  Social Needs  . Financial resource strain: Not on file  . Food insecurity:    Worry: Not on file    Inability: Not on file  . Transportation needs:    Medical: Not on file    Non-medical: Not on file  Tobacco Use  . Smoking status: Never Smoker  . Smokeless tobacco: Former Systems developer    Types: Chew  . Tobacco comment: "quit chewing in the 1960s"  Substance and Sexual Activity  . Alcohol use: No  . Drug use: No  . Sexual activity: Never  Lifestyle  . Physical activity:    Days per week: Not on file    Minutes per session: Not on file  . Stress: Not on file  Relationships  . Social connections:    Talks on phone: Not on file    Gets together: Not on file    Attends religious service: Not on file    Active member of club or organization: Not on file    Attends meetings of clubs or organizations: Not on file    Relationship status: Not on file  . Intimate partner violence:    Fear of current or ex partner: Not on file    Emotionally abused: Not on file    Physically abused: Not on file    Forced sexual activity: Not on file  Other Topics Concern  . Not on file  Social History Narrative  . Not on file    Family History:   Family History  Problem Relation Age of Onset  . Arthritis-Osteo Sister   . Heart attack Brother   . Heart attack Other   . Cancer Neg Hx      ROS:  Please see the history of present illness.  Review of Systems  Constitution: Negative for decreased appetite, diaphoresis, night sweats and weight gain.  Eyes: Negative  for  discharge and pain.  Cardiovascular: Positive for chest pain and orthopnea. Negative for claudication, irregular heartbeat and leg swelling.  Respiratory: Positive for cough and shortness of breath. Negative for hemoptysis.   Endocrine: Negative for polyuria.  Musculoskeletal: Negative for joint pain and myalgias.  Gastrointestinal: Negative for abdominal pain, constipation, diarrhea, nausea and vomiting.  Genitourinary: Negative for frequency.  Neurological: Negative for dizziness, focal weakness and headaches.  Psychiatric/Behavioral: Negative for altered mental status and depression. The patient is not nervous/anxious.     All other ROS reviewed and negative.     Physical Exam/Data:   Vitals:   06/26/17 1152 06/26/17 1245 06/26/17 1300  BP: (!) 155/79 (!) 152/94 (!) 160/81  Pulse: 61 62 71  Resp: 18 19   Temp: 98 F (36.7 C)    TempSrc: Oral    SpO2: 100% 96% 98%  Weight: 78.5 kg (173 lb)    Height: 5\' 6"  (1.676 m)     No intake or output data in the 24 hours ending 06/26/17 1340 Filed Weights   06/26/17 1152  Weight: 78.5 kg (173 lb)   Body mass index is 27.92 kg/m.  General:  Well nourished, well developed, in no acute distress HEENT: normal Lymph: no adenopathy Neck: no JVD Endocrine:  No thryomegaly Cardiac:  normal S1, S2; RRR; no murmur auscultated Lungs:  clear to auscultation bilaterally, no wheezing, rhonchi or rales  Abd: soft, nontender, no hepatomegaly  Ext: no edema Musculoskeletal:  No deformities, BUE and BLE strength normal and equal Skin: warm and dry  Neuro:  CNs 2-12 intact, no focal abnormalities noted Psych:  Normal affect   EKG:  The EKG was personally reviewed and demonstrates:  Mild ST depression in the inferolateral leads Telemetry:  Telemetry was personally reviewed and demonstrates:  NSR  Relevant CV Studies:  Echo 12/21/2016 Study Conclusions - Left ventricle: The cavity size was normal. There was moderate   concentric  hypertrophy. Systolic function was normal. The   estimated ejection fraction was in the range of 60% to 65%. Wall   motion was normal; there were no regional wall motion   abnormalities. - Aortic valve: Severely calcified annulus. Trileaflet; mildly   thickened, mildly calcified leaflets. There was mild stenosis.   Valve area (VTI): 1.38 cm^2. Valve area (Vmax): 1.08 cm^2. Valve   area (Vmean): 1.43 cm^2. - Aorta: Aortic root dimension: 40 mm (ED). - Aortic root: The aortic root was mildly dilated. - Mitral valve: Calcified annulus. There was mild regurgitation. - Left atrium: The atrium was severely dilated. - Right atrium: The atrium was moderately dilated. - Tricuspid valve: There was mild regurgitation. - Pulmonary arteries: PA peak pressure: 32 mm Hg (S). - Pericardium, extracardiac: A trivial, free-flowing pericardial   effusion was identified posterior to the heart. The fluid had no   internal echoes.  Laboratory Data:  Chemistry Recent Labs  Lab 06/21/17 1617 06/26/17 1154  NA 139 136  K 4.3 5.3*  CL 105 106  CO2 24 19*  GLUCOSE 101* 99  BUN 34* 13  CREATININE 1.28 0.98  CALCIUM 9.9 8.7*  GFRNONAA  --  >60  GFRAA  --  >60  ANIONGAP  --  11    No results for input(s): PROT, ALBUMIN, AST, ALT, ALKPHOS, BILITOT in the last 168 hours. Hematology Recent Labs  Lab 06/26/17 1154  WBC 6.6  RBC 3.96*  HGB 12.2*  HCT 37.5*  MCV 94.7  MCH 30.8  MCHC 32.5  RDW 14.9  PLT  93*   Cardiac EnzymesNo results for input(s): TROPONINI in the last 168 hours.  Recent Labs  Lab 06/26/17 1201  TROPIPOC 0.01    BNP Recent Labs  Lab 06/21/17 1617  PROBNP 666.0*    DDimer No results for input(s): DDIMER in the last 168 hours.  Radiology/Studies:  No results found.  Assessment and Plan:   Assessment: Kailyn Dubie is an 54 yoM with PMHx notable for CAD s/p CABG x2 in 1998 and cath 2009 w/ patent stents, adenocarcinoma of the right lung, 12/2015 s/p SBRT and suspected  radiation pneumonitis, HTN, HLD, and BPH who presented to the [facility] ED for dyspnea and associated left sided chest pain both of which improve with his home duoneb treatments.  There is no associated ankle swelling.  The etiology of the patient's chest pain and dyspnea is unclear at this time, however, given his extensive coronary artery disease cardiac history further evaluation may be warranted.  The patient is agreeable to undergo cardiac catheterization as his most recent evaluation was reportedly in 2009 which demonstrated patent native arterial grafts.    Plan: #Dyspnea: Patient has a notable past medical history significant for adenocarcinoma of the right lung status post radiation.  He follows with pulmonologist Dr. Alveta Heimlich who notes the recent progress summary on 06/14/2017 but the he had multiple trips dyspnea dyspnea has been ongoing for 6-8 weeks, to their office in the ER without notable improvement and occasionally produces phlegm rare cough. He was again prescribed a course of prednisone 20 mg daily for 5 days on the visit on 06/14/2017 which significantly improved his shortness of breath until 2 days post completion of the course.  Unclear etiology at this time.  Chest x-ray unremarkable in previous visits however, there is notable increased opacity in the left lower lobe concerning for edema versus chronic scarring. -Repeat CXR in a.m. -Continue Duonebs -As needed oxygen as needed to maintain O2 sats with saturation above 92%  #Chest pain: Given his presentation thus far this seems unlikely to be cardiac in origin. However, with his extensive cardiac history further evaluation is warranted/patient was slated for cardiac catheterization cessation which she is amenable to. -Trend troponins x3 -Sublingual NTG 0.4 mg every 5 minutes as needed chest pain - Echocardiogram complete ordered 3/26 -We will schedule the patient for cardiac cath and discuss with patient's primary cardiologist -EKG  stat for chest pain -Continue atenolol 20 mg daily  #History of pericarditis: Patient has a notable past medical history for pericarditis with a history of colchicine 0.6 mg daily.  He is unable to tolerate the colchicine 0.6 mg twice daily due to GI side effects.  Unable to delineate if this chest pain is similar to his previous episode of pericarditis.  However, given symptomatic improvement with concrement and prednisone therapy there is a notable possibility that his current discomfort is related to his pericarditis. -Continue Colchicine 0.6 mg daily  #HLD: Most recent lipid panel with HDL 28 LDL 65.  Continue rosuvastatin 5 mg daily.    #HTN: The patient's blood pressure is typically well controlled as per the daughter.  He is currently mildly hypertensive.  We will continue his atenolol at this time consider the addition of an ACE inhibitor.  #Anxiety: No acute anxiety issues reported with the patient at this time.  However, patient is regularly prescribed his lorazepam fills his prescription on schedule as per the database.  As such due to the concern for acute withdrawal we will continue the Ativan  1 mg 3 times daily.  #BPH: No acute concerns patient denying urinary symptoms different from baseline.  Will monitor output daily. -Continue tamsulosin 0.4 mg daily  VTE PPX: Lovenox 40 mg daily  For questions or updates, please contact [place] Please consult [email or web address] for contact info under Cardiology/STEMI.   Please see attending note/attestation for current assessment and plan.  Signed, Kathi Ludwig, MD Internal Medicine, PGY-1 Pager # [phone] Until 5pm. Please contact on call after 5pm. 06/26/2017 1:40 PM

## 2017-06-26 NOTE — Patient Instructions (Addendum)
Go to ER

## 2017-06-26 NOTE — Telephone Encounter (Signed)
New Message    Pt c/o Shortness Of Breath: STAT if SOB developed within the last 24 hours or pt is noticeably SOB on the phone  1. Are you currently SOB (can you hear that pt is SOB on the phone)? He has been having shortness of breath for 3 weeks did not talk to patient spoke with daughter   2. How long have you been experiencing SOB? 3 weeks   3. Are you SOB when sitting or when up moving around? When he is sitting and moving around. At night he can not lay down and sleep due to the shortness of breath   4. Are you currently experiencing any other symptoms? No

## 2017-06-26 NOTE — Progress Notes (Signed)
@Patient  ID: [name], male    DOB: 1927/11/09, 84 y.o.   MRN: [id number]  Chief Complaint  Patient presents with  . 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 , CAD s/p CABG. , Pericardiditis 12/2016   TEST PFT 01/05/16:FVC 3.26 L (111%) FEV1 2.61 L (131%) FEV1/FVC 0.80 FEF 25-75 2.60 L (214%) negative bronchodilator response TLC 5.47 L (90%) RV 86% ERV 401% DLCO corrected 46% 2018 Spirometry today show some mild restriction with FVC at 70%, FEV1 is 87%, ratio 85%  WALK TEST 10/11/16: Walked 1lap / Baseline Sat 100% on RA / Nadir Sat 100% on RA (stopped due to feeling his legs were weak)  IMAGING CT CHEST W/ CONTRAST 10/04/16 (personally reviewed by me):Trace left pleural effusion. Pleural thickening on the right noted. No pathologic mediastinal adenopathy. No pericardial effusion. Improvement in right basilar density compared with CT imaging performed in February. There is also decreased subpleural patchy groundglass opacities as well. No new or developing areas of opacification or groundglass. Questionable subpleural reticulation, primarily on the right. No obvious honeycombing or bronchiectasis appreciated.  PET CT 12/22/15 (per radiologist):  IMPRESSION: 1. Hypermetabolic (max SUV 3.9) posterior right lower lobe predominantly solid 2.9 cm pulmonary nodule, which is increased in size back to 2013, most consistent with primary bronchogenic adenocarcinoma. 2. Patchy subpleural reticulation throughout both lungs, basilar predominant, suspicious for interstitial lung disease, not well characterized on this study. Consider correlation with high-resolution chest CT in 6-12 months to assess temporal pattern stability, as clinically warranted. 3. Hypermetabolic bilateral hilar and mediastinal  lymphadenopathy is highly nonspecific given the suspected underlying interstitial lung disease, and could be reactive or metastatic. 4. Nonspecific hypermetabolic right level 2 neck lymph node, favor reactive node, cannot exclude metastasis. 5. No hypermetabolic osseous or abdominopelvic metastatic disease.  6. Mild hypermetabolism associated with subcutaneous fat stranding in the lateral right hip soft tissues, probably benign, correlate with any history of recent trauma or procedure in this location. 7. Relatively symmetric hypermetabolism in the bilateral glenohumeral joints with associated osteoarthritis, favor degenerative uptake. 8. Additional findings include mild cardiomegaly, aortic atherosclerosis, coronary atherosclerosis status post CABG and moderate sigmoid diverticulosis.  CARDIAC TTE (05/23/16):The normal in size with moderate concentric hypertrophy. EF 44-01%UUVO normal diastolic function. LA moderately to severely dilated with RA normal in size. RV normal in size and function. Mild aortic stenosis with moderately calcified leaflets. Mild mitral regurgitation. Trivial pulmonic regurgitation. Mild tricuspid regurgitation.  PATHOLOGY CT-GUIDED BIOPSY RIGHT LOWER LOBE MASS (12/30/15):Adenocarcinoma   06/26/2017 Acute OV : Dyspnea Patient returns for persistent shortness of breath.  He was seen 6 weeks ago in the emergency room for a bronchitic exacerbation.  He was treated with a Z-Pak.  D-dimer was negative.  patient was seen in the office on February 11, at that time patient was improving.  However patient returned back to the office on March 13 and March 20 for persistent dyspnea.  He was given a prednisone 20 mg for 5 days.  Lab work showed an elevated BNP.Patient was called in Lasix 40 mg daily for 5 days.  Says he got better but symptoms returned last 3 days . Started having chest pains especially at night . Last night had severe dyspnea with left sided chest pains in middle of  night . Got up and took breathing treatment with some help. In office today , feels some dyspnea and chest tightness on left. Feels like squeezing pain in lower chest .  EKG with notable new ST elevation in lead V2 only , inverted t waves in V5 and V6 .  Pt was placed on O2 at 2l/m and EMS called for transport to ER for evaluation /r/o ACS .  VSS are stable . O2 sats 98% on room air.  Pt unsure if he can take aspirin or not .   Patient has known history of lung adenocarcinoma with a right lower lobe lung mass diagnosed September 2017.  He was treated with radiation as patient was felt not to be a good candidate for lobectomy given his advanced age, limited mobility, and chronic dyspnea.. Surveillance CT chest January 2019 showed a similar appearance of the posterior and medial airspace opacity along the right lung base.  The nodular compartment had decreased in size and there was stable mediastinal lymph nodes.     Has  AFib on Eliquis . Missed last night dose.    Allergies  Allergen Reactions  . Feldene [Piroxicam] Other (See Comments)    REACTION: Blisters  . Statins Other (See Comments)    Myalgias  . Doxycycline Other (See Comments)    REACTION:  unknown  . Latex Rash  . Tequin Anxiety  . Valium Other (See Comments)    REACTION:  unknown    Immunization History  Administered Date(s) Administered  . Influenza Whole 01/26/2007, 01/10/2008, 12/30/2009  . Influenza, High Dose Seasonal PF 12/23/2016  . Influenza,inj,Quad PF,6+ Mos 12/18/2012, 01/16/2015, 12/29/2015  . Pneumococcal Conjugate-13 06/24/2014  . Pneumococcal Polysaccharide-23 01/25/2000  . Td 12/27/2000, 03/09/2010  . Tdap 03/09/2010    Past Medical History:  Diagnosis Date  . Adenocarcinoma of right lung ([place]) 01/06/2016  . Anginal chest pain at rest [facility])    Chronic non, controlled on Lorazepam  . Anxiety   . Arthritis    "all over"   . BPH (benign prostatic hyperplasia)   . CAD (coronary artery disease)   .  Chronic lower back pain   . Complication of anesthesia    "problems making water afterwards"  . Depression   . DJD (degenerative joint disease)   . Esophageal ulcer without bleeding    bid ppi indefinitely  . Family history of adverse reaction to anesthesia    "children w/PONV"  . GERD (gastroesophageal reflux disease)   . Headache    "couple/week maybe" (09/04/2015)  . Hemochromatosis    Possible, elevated iron stores, hook-like osteophytes on hand films, normal LFTs  . Hepatitis    "yellow jaundice as a baby"  . History of ASCVD    MULTIVESSEL  . Hyperlipidemia   . Hypertension   . Myocardial infarction ([place]) 1999  . Osteoarthritis   . Pneumonia    "several times; got a little now" (09/04/2015)  . Rheumatoid arthritis ([place])   . Subdural hematoma ([facility])    small after fall 09/2016-plavix held, neurosurgery consulted.  Asymptomatic.      Tobacco History: Social History   Tobacco Use  Smoking Status Never Smoker  Smokeless Tobacco Former Systems developer  . Types: Chew  Tobacco Comment   "quit chewing in the 1960s"   Counseling given: Not Answered Comment: "quit chewing in the 1960s"   Outpatient Encounter Medications as of 06/26/2017  Medication Sig  . apixaban (ELIQUIS) 5 MG TABS tablet Take 1 tablet (5 mg total) by  mouth 2 (two) times daily.  Marland Kitchen atenolol (TENORMIN) 25 MG tablet Take 25 mg by mouth daily.  . Cholecalciferol 2000 units TABS Take 1 tablet (2,000 Units total) by mouth daily.  . colchicine 0.6 MG tablet Take 1 tablet (0.6 mg total) by mouth daily.  . Cyanocobalamin (VITAMIN B-12 PO) Take 1 tablet by mouth daily.  Marland Kitchen FLUoxetine (PROZAC) 20 MG capsule TAKE 20 mg  BY MOUTH DAILY  . ipratropium-albuterol (DUONEB) 0.5-2.5 (3) MG/3ML SOLN Take 3 mLs by nebulization every 4 (four) hours as needed (or Chest Pain / Tightness).  Marland Kitchen Lifitegrast (XIIDRA) 5 % SOLN Place 1-2 drops into both eyes daily.  Marland Kitchen LORazepam (ATIVAN) 1 MG tablet TAKE ONE (1) TABLET BY MOUTH 3 TIMES DAILY  .  nitroGLYCERIN (NITROSTAT) 0.4 MG SL tablet Place 1 tablet (0.4 mg total) under the tongue every 5 (five) minutes as needed for chest pain.  . pantoprazole (PROTONIX) 40 MG tablet Take one (1) tablet (40 mg) by mouth daily.  . rosuvastatin (CRESTOR) 5 MG tablet TAKE 5 mg  EVERY DAY(NEED TO SCHEDULE YEARLY APPOINTMENT FOR AUGUST )  . Spacer/Aero-Holding Chambers (AEROCHAMBER PLUS WITH MASK) inhaler Use as instructed  . tamsulosin (FLOMAX) 0.4 MG CAPS capsule TAKE 0.4 gm by mouth EVERY DAY  . furosemide (LASIX) 40 MG tablet Take 1 tablet (40 mg total) by mouth daily. (Patient not taking: Reported on 06/21/2017)   No facility-administered encounter medications on file as of 06/26/2017.      Review of Systems  Constitutional:   No  weight loss, night sweats,  Fevers, chills, + fatigue, or  lassitude.  HEENT:   No headaches,  Difficulty swallowing,  Tooth/dental problems, or  Sore throat,                No sneezing, itching, ear ache, nasal congestion, post nasal drip,   CV:  No chest pain,  Orthopnea, PND, swelling in lower extremities, anasarca, dizziness, palpitations, syncope.   GI  No heartburn, indigestion, abdominal pain, nausea, vomiting, diarrhea, change in bowel habits, loss of appetite, bloody stools.   Resp:   No excess mucus, no productive cough,  No non-productive cough,  No coughing up of blood.  No change in color of mucus.  No wheezing.  No chest wall deformity  Skin: no rash or lesions.  GU: no dysuria, change in color of urine, no urgency or frequency.  No flank pain, no hematuria   MS:  No joint pain or swelling.  No decreased range of motion.  No back pain.    Physical Exam  BP 128/80 (BP Location: Left Arm, Cuff Size: Normal)   Ht 5\' 5"  (1.651 m)   Wt 176 lb 12.8 oz (80.2 kg)   SpO2 98%   BMI 29.42 kg/m   GEN: A/Ox3; pleasant , NAD, elderly    HEENT:  [place]/AT,  EACs-clear, TMs-wnl, NOSE-clear, THROAT-clear, no lesions, no postnasal drip or exudate noted.    NECK:  Supple w/ fair ROM; no JVD; normal carotid impulses w/o bruits; no thyromegaly or nodules palpated; no lymphadenopathy.    RESP  Clear  P & A; w/o, wheezes/ rales/ or rhonchi. no accessory muscle use, no dullness to percussion  CARD:  RRR, no m/r/g, tr  peripheral edema, pulses intact, no cyanosis or clubbing.  GI:   Soft & nt; nml bowel sounds; no organomegaly or masses detected.   Musco: Warm bil, no deformities or joint swelling noted.   Neuro: alert, no focal deficits noted.  Skin: Warm, no lesions or rashes    Lab Results:  CBC    Component Value Date/Time   WBC 6.0 05/10/2017 1450   RBC 4.27 05/10/2017 1450   HGB 12.9 (L) 05/10/2017 1450   HGB 13.4 08/16/2016 1155   HCT 39.6 05/10/2017 1450   HCT 39.8 08/16/2016 1155   PLT 94 (L) 05/10/2017 1450   PLT 81 (L) 08/16/2016 1155   MCV 92.7 05/10/2017 1450   MCV 92.3 08/16/2016 1155   MCH 30.2 05/10/2017 1450   MCHC 32.6 05/10/2017 1450   RDW 14.2 05/10/2017 1450   RDW 13.3 08/16/2016 1155   LYMPHSABS 792 (L) 01/17/2017 1115   LYMPHSABS 1.3 08/16/2016 1155   MONOABS 1,197 (H) 10/03/2016 0910   MONOABS 1.2 (H) 08/16/2016 1155   EOSABS 149 01/17/2017 1115   EOSABS 0.1 08/16/2016 1155   BASOSABS 28 01/17/2017 1115   BASOSABS 0.0 08/16/2016 1155    BMET    Component Value Date/Time   NA 139 06/21/2017 1617   NA 143 08/16/2016 1155   K 4.3 06/21/2017 1617   K 4.3 08/16/2016 1155   CL 105 06/21/2017 1617   CO2 24 06/21/2017 1617   CO2 24 08/16/2016 1155   GLUCOSE 101 (H) 06/21/2017 1617   GLUCOSE 88 08/16/2016 1155   GLUCOSE 120 (H) 04/20/2015 1600   BUN 34 (H) 06/21/2017 1617   BUN 14.6 08/16/2016 1155   CREATININE 1.28 06/21/2017 1617   CREATININE 1.19 (H) 01/17/2017 1115   CREATININE 1.2 08/16/2016 1155   CALCIUM 9.9 06/21/2017 1617   CALCIUM 9.0 08/16/2016 1155   GFRNONAA 40 (L) 05/10/2017 1450   GFRNONAA 55 (L) 01/17/2017 1115   GFRAA 46 (L) 05/10/2017 1450   GFRAA 63 01/17/2017 1115     BNP    Component Value Date/Time   BNP 454.7 (H) 05/11/2017 0352    ProBNP    Component Value Date/Time   PROBNP 666.0 (H) 06/21/2017 1617    Imaging: Dg Chest 2 View  Result Date: 06/14/2017 CLINICAL DATA:  84 y/o M; 1 month of shortness of breath worsening in the last 2 days. History of right lung cancer. EXAM: CHEST - 2 VIEW COMPARISON:  05/10/2017 chest radiograph.  04/20/2017 CT chest. FINDINGS: Stable cardiac silhouette given projection and technique. Aortic atherosclerosis with calcification. Interval increase in reticular opacities and trace pleural fluid tracking along fissures. Stable opacity right posterior lung base. Post median sternotomy with wires in alignment. No acute osseous abnormality identified. IMPRESSION: Increase reticular opacities and pleural fluid tracking along fissures probably representing interstitial edema. Stable right lung base opacity probably representing sequelae of prior pneumonia. Electronically Signed   By: Kristine Garbe M.D.   On: 06/14/2017 14:49     Assessment & Plan:   Unstable angina ([place]) Pt with recurrent chest tightness , chest pain, dyspnea with worsening x 3 days . Known underlying CAD and EKG changes . Pt will need transfer to ER for further evaluation /rule out ACS .  Pt was placed on O2 at 2l/m . Declined Aspirin EMS called and with transfer to ER . Pt stable for transfer .   Plan  Go to ER via EMS .   Diastolic CHF ([facility]) D CHF - BNP has  Been rising . Improved symptoms on Lasix . Will need further evaluaiton for possible angina . May need repeat echo.  Would begin lasix if able going forward .       Rexene Edison, NP 06/26/2017

## 2017-06-26 NOTE — Assessment & Plan Note (Signed)
D CHF - BNP has  Been rising . Improved symptoms on Lasix . Will need further evaluaiton for possible angina . May need repeat echo.  Would begin lasix if able going forward .

## 2017-06-26 NOTE — ED Notes (Signed)
Pt assisted back to bed and hooked back up on monitor, pt is now resting

## 2017-06-26 NOTE — ED Notes (Signed)
Pt eating clear liquid diet.  Family at bedside

## 2017-06-26 NOTE — ED Triage Notes (Signed)
Per ems-Is coming from PCP, for abnormal EKG. His main complaint is left sided intermittent squeezing CP x 2 weeks. Usually takes breathing treatment then improvement, has been using more frequently. Elevation V2, t-wave inversion V4, 5, 6. At current denies CP or SOB. BP 146/90, HR 66, RR 18, 95% RA, CBG 111. 324 aspirin given, 18 g left forearm.

## 2017-06-26 NOTE — Telephone Encounter (Signed)
Spoke with patient's daughter [name] (DPR on file) who states that the patient has had worsening SOB over the past 3 weeks. She states that he started having episodes of chest discomfort that typically occur at night. She states that he would take his breathing treatment and it would feel better. She states that the patient went to see his pulmonologist this morning and she sent him to the ER for an evaluation. Patient currently in the ER. Patient has an appointment on 4/5. Will keep this appointment for now.

## 2017-06-26 NOTE — ED Notes (Signed)
Pt assisted to the restroom

## 2017-06-27 ENCOUNTER — Other Ambulatory Visit: Payer: Self-pay

## 2017-06-27 ENCOUNTER — Encounter (HOSPITAL_COMMUNITY): Payer: Self-pay | Admitting: Cardiovascular Disease

## 2017-06-27 ENCOUNTER — Inpatient Hospital Stay (HOSPITAL_COMMUNITY): Payer: Medicare HMO

## 2017-06-27 ENCOUNTER — Other Ambulatory Visit (HOSPITAL_COMMUNITY): Payer: Medicare HMO

## 2017-06-27 ENCOUNTER — Encounter (HOSPITAL_COMMUNITY)
Admission: EM | Disposition: A | Discharge status: Home / Self Care | Payer: Self-pay | Source: Home / Self Care | Attending: Interventional Cardiology

## 2017-06-27 DIAGNOSIS — I5032 Chronic diastolic (congestive) heart failure: Secondary | ICD-10-CM

## 2017-06-27 DIAGNOSIS — I252 Old myocardial infarction: Secondary | ICD-10-CM

## 2017-06-27 DIAGNOSIS — Z951 Presence of aortocoronary bypass graft: Secondary | ICD-10-CM

## 2017-06-27 DIAGNOSIS — I214 Non-ST elevation (NSTEMI) myocardial infarction: Secondary | ICD-10-CM

## 2017-06-27 HISTORY — PX: LEFT HEART CATH AND CORS/GRAFTS ANGIOGRAPHY: CATH118250

## 2017-06-27 LAB — COMPREHENSIVE METABOLIC PANEL
ALT: 17 U/L (ref 17–63)
AST: 22 U/L (ref 15–41)
Albumin: 3.2 g/dL — ABNORMAL LOW (ref 3.5–5.0)
Alkaline Phosphatase: 104 U/L (ref 38–126)
Anion gap: 8 (ref 5–15)
BUN: 11 mg/dL (ref 6–20)
CO2: 21 mmol/L — ABNORMAL LOW (ref 22–32)
Calcium: 8.5 mg/dL — ABNORMAL LOW (ref 8.9–10.3)
Chloride: 109 mmol/L (ref 101–111)
Creatinine, Ser: 1.03 mg/dL (ref 0.61–1.24)
GFR calc Af Amer: >60 mL/min (ref >60)
GFR calc non Af Amer: >60 mL/min (ref >60)
Glucose, Bld: 96 mg/dL (ref 65–99)
Potassium: 4.1 mmol/L (ref 3.5–5.1)
Sodium: 138 mmol/L (ref 135–145)
Total Bilirubin: 1.4 mg/dL — ABNORMAL HIGH (ref 0.3–1.2)
Total Protein: 5.6 g/dL — ABNORMAL LOW (ref 6.5–8.1)

## 2017-06-27 LAB — CBC
HCT: 37.4 % — ABNORMAL LOW (ref 39.0–52.0)
Hemoglobin: 12.3 g/dL — ABNORMAL LOW (ref 13.0–17.0)
MCH: 30.4 pg (ref 26.0–34.0)
MCHC: 32.9 g/dL (ref 30.0–36.0)
MCV: 92.3 fL (ref 78.0–100.0)
Platelets: 84 10*3/uL — ABNORMAL LOW (ref 150–400)
RBC: 4.05 MIL/uL — ABNORMAL LOW (ref 4.22–5.81)
RDW: 14.7 % (ref 11.5–15.5)
WBC: 7.2 10*3/uL (ref 4.0–10.5)

## 2017-06-27 LAB — TROPONIN I: Troponin I: 0.05 ng/mL (ref <0.03)

## 2017-06-27 SURGERY — LEFT HEART CATH AND CORS/GRAFTS ANGIOGRAPHY
Anesthesia: LOCAL

## 2017-06-27 MED ORDER — IOPAMIDOL (ISOVUE-370) INJECTION 76%
INTRAVENOUS | Status: DC | PRN
  Administered 2017-06-27: 100 mL via INTRA_ARTERIAL

## 2017-06-27 MED ORDER — SODIUM CHLORIDE 0.9 % IV SOLN: 250.0000 mL | INTRAVENOUS | Status: DC | PRN

## 2017-06-27 MED ORDER — LIDOCAINE HCL (PF) 1 % IJ SOLN
INTRAMUSCULAR | Status: DC | PRN
  Administered 2017-06-27: 15 mL

## 2017-06-27 MED ORDER — SODIUM CHLORIDE 0.9 % WEIGHT BASED INFUSION
3.0000 mL/kg/h | INTRAVENOUS | Status: DC
  Administered 2017-06-27: 3 mL/kg/h via INTRAVENOUS

## 2017-06-27 MED ORDER — IOPAMIDOL (ISOVUE-370) INJECTION 76%
INTRAVENOUS | Status: AC
  Filled 2017-06-27: qty 125

## 2017-06-27 MED ORDER — MIDAZOLAM HCL 2 MG/2ML IJ SOLN
INTRAMUSCULAR | Status: DC | PRN
  Administered 2017-06-27 (×2): 1 mg via INTRAVENOUS

## 2017-06-27 MED ORDER — HEPARIN (PORCINE) IN NACL 2-0.9 UNIT/ML-% IJ SOLN
INTRAMUSCULAR | Status: AC
  Filled 2017-06-27: qty 1000

## 2017-06-27 MED ORDER — IOPAMIDOL (ISOVUE-370) INJECTION 76%
INTRAVENOUS | Status: AC
  Filled 2017-06-27: qty 100

## 2017-06-27 MED ORDER — FENTANYL CITRATE (PF) 100 MCG/2ML IJ SOLN
INTRAMUSCULAR | Status: DC | PRN
  Administered 2017-06-27: 25 ug via INTRAVENOUS

## 2017-06-27 MED ORDER — HEPARIN (PORCINE) IN NACL 2-0.9 UNIT/ML-% IJ SOLN
INTRAMUSCULAR | Status: AC | PRN
  Administered 2017-06-27: 500 mL

## 2017-06-27 MED ORDER — ONDANSETRON HCL 4 MG/2ML IJ SOLN: 4.0000 mg | Freq: Four times a day (QID) | INTRAMUSCULAR | Status: DC | PRN

## 2017-06-27 MED ORDER — SODIUM CHLORIDE 0.9% FLUSH: 3.0000 mL | Freq: Two times a day (BID) | INTRAVENOUS | Status: DC

## 2017-06-27 MED ORDER — ACETAMINOPHEN 325 MG PO TABS
650.0000 mg | ORAL_TABLET | ORAL | Status: DC | PRN
  Administered 2017-06-27: 650 mg via ORAL
  Filled 2017-06-27: qty 2

## 2017-06-27 MED ORDER — SODIUM CHLORIDE 0.9 % WEIGHT BASED INFUSION: 1.0000 mL/kg/h | INTRAVENOUS | Status: DC

## 2017-06-27 MED ORDER — ASPIRIN 81 MG PO CHEW
81.0000 mg | CHEWABLE_TABLET | Freq: Every day | ORAL | Status: DC
  Administered 2017-06-28: 81 mg via ORAL
  Filled 2017-06-27: qty 1

## 2017-06-27 MED ORDER — SODIUM CHLORIDE 0.9% FLUSH: 3.0000 mL | INTRAVENOUS | Status: DC | PRN

## 2017-06-27 MED ORDER — MIDAZOLAM HCL 2 MG/2ML IJ SOLN
INTRAMUSCULAR | Status: AC
  Filled 2017-06-27: qty 2

## 2017-06-27 MED ORDER — FENTANYL CITRATE (PF) 100 MCG/2ML IJ SOLN
INTRAMUSCULAR | Status: AC
  Filled 2017-06-27: qty 2

## 2017-06-27 MED ORDER — ASPIRIN 81 MG PO CHEW: 81.0000 mg | CHEWABLE_TABLET | ORAL | Status: AC

## 2017-06-27 MED ORDER — SODIUM CHLORIDE 0.9 % WEIGHT BASED INFUSION
1.0000 mL/kg/h | INTRAVENOUS | Status: DC
  Administered 2017-06-27: 1 mL/kg/h via INTRAVENOUS

## 2017-06-27 MED ORDER — ASPIRIN 81 MG PO CHEW
81.0000 mg | CHEWABLE_TABLET | ORAL | Status: AC
  Administered 2017-06-27: 81 mg via ORAL
  Filled 2017-06-27: qty 1

## 2017-06-27 MED ORDER — LIDOCAINE HCL 1 % IJ SOLN
INTRAMUSCULAR | Status: AC
  Filled 2017-06-27: qty 20

## 2017-06-27 MED ORDER — SODIUM CHLORIDE 0.9 % IV SOLN
INTRAVENOUS | Status: DC
  Administered 2017-06-28: 05:00:00 via INTRAVENOUS

## 2017-06-27 MED ORDER — SODIUM CHLORIDE 0.9 % WEIGHT BASED INFUSION: 3.0000 mL/kg/h | INTRAVENOUS | Status: AC

## 2017-06-27 SURGICAL SUPPLY — 11 items
CATH INFINITI 5 FR IM (CATHETERS) ×2 IMPLANT
CATH INFINITI 5FR MULTPACK ANG (CATHETERS) ×2 IMPLANT
KIT HEART LEFT (KITS) ×2 IMPLANT
PACK CARDIAC CATHETERIZATION (CUSTOM PROCEDURE TRAY) ×2 IMPLANT
SHEATH AVANTI 11CM 5FR (SHEATH) ×2 IMPLANT
SYR MEDRAD MARK V 150ML (SYRINGE) ×2 IMPLANT
TRANSDUCER W/STOPCOCK (MISCELLANEOUS) ×2 IMPLANT
WIRE EMERALD 3MM-J .025X260CM (WIRE) IMPLANT
WIRE EMERALD 3MM-J .035X150CM (WIRE) ×2 IMPLANT
WIRE EMERALD 3MM-J .035X260CM (WIRE) ×2 IMPLANT
WIRE EMERALD ST .035X260CM (WIRE) IMPLANT

## 2017-06-27 NOTE — Progress Notes (Signed)
Progress Note  Patient Name: Evan [name] Date of Encounter: 06/27/2017  Primary Cardiologist: [place]   Did not sleep very much last night.  Looking forward to coronary angiography this morning.  Inpatient Medications    Scheduled Meds: . atenolol  25 mg Oral Daily  . colchicine  0.6 mg Oral Daily  . enoxaparin (LOVENOX) injection  40 mg Subcutaneous Q24H  . FLUoxetine  20 mg Oral Daily  . pantoprazole  40 mg Oral Daily  . rosuvastatin  5 mg Oral q1800  . sodium chloride flush  3 mL Intravenous Q12H  . tamsulosin  0.4 mg Oral Daily   Continuous Infusions: . sodium chloride    . [START ON 06/28/2017] sodium chloride Stopped (06/27/17 5366)   Followed by  . [START ON 06/28/2017] sodium chloride 1 mL/kg/hr (06/27/17 0544)   PRN Meds: sodium chloride, ipratropium-albuterol, LORazepam, nitroGLYCERIN, polyvinyl alcohol, sodium chloride flush   Vital Signs    Vitals:   06/26/17 2300 06/27/17 0400 06/27/17 0500 06/27/17 0600  BP: (!) 181/83 (!) 158/81 (!) 161/73 (!) 149/64  Pulse: 68 63 65 64  Resp: (!) 23 (!) 26 19 16   Temp:      TempSrc:      SpO2: 95% 95% 96% 95%  Weight:      Height:       No intake or output data in the 24 hours ending 06/27/17 0828 Filed Weights   06/26/17 1152  Weight: 173 lb (78.5 kg)    Telemetry    Normal sinus rhythm- Personally Reviewed  ECG    Nonspecific T wave inversion.- Personally Reviewed  Physical Exam  Elderly without respiratory distress or pallor. GEN: No acute distress.   Neck: No JVD Cardiac: RRR, no murmurs, rubs, or gallops.  Respiratory: Clear to auscultation bilaterally. GI: Soft, nontender, non-distended  MS: No edema; No deformity. Neuro:  Nonfocal  Psych: Normal affect   Labs    Chemistry Recent Labs  Lab 06/21/17 1617 06/26/17 1154 06/27/17 0445  NA 139 136 138  K 4.3 5.3* 4.1  CL 105 106 109  CO2 24 19* 21*  GLUCOSE 101* 99 96  BUN 34* 13 11  CREATININE 1.28 0.98 1.03  CALCIUM  9.9 8.7* 8.5*  PROT  --   --  5.6*  ALBUMIN  --   --  3.2*  AST  --   --  22  ALT  --   --  17  ALKPHOS  --   --  104  BILITOT  --   --  1.4*  GFRNONAA  --  >60 >60  GFRAA  --  >60 >60  ANIONGAP  --  11 8     Hematology Recent Labs  Lab 06/26/17 1154 06/27/17 0445  WBC 6.6 7.2  RBC 3.96* 4.05*  HGB 12.2* 12.3*  HCT 37.5* 37.4*  MCV 94.7 92.3  MCH 30.8 30.4  MCHC 32.5 32.9  RDW 14.9 14.7  PLT 93* 84*    Cardiac Enzymes Recent Labs  Lab 06/26/17 1717 06/26/17 2153 06/27/17 0445  TROPONINI 0.05* 0.06* 0.05*    Recent Labs  Lab 06/26/17 1201  TROPIPOC 0.01     BNP Recent Labs  Lab 06/21/17 1617  PROBNP 666.0*     DDimer No results for input(s): DDIMER in the last 168 hours.   Radiology    Dg Chest Port 1 View  Result Date: 06/27/2017 CLINICAL DATA:  Dyspnea. History of coronary artery disease and previous MI, previous  episodes of pneumonia, nonsmoker. EXAM: PORTABLE CHEST 1 VIEW COMPARISON:  Portable chest x-ray of June 26, 2017 FINDINGS: The lungs are well-expanded. Hazy increased density seen on the previous study obscuring much of the left heart border is no longer evident. The interstitial markings are both lungs are coarse. There is no alveolar infiltrate or pleural effusion. The cardiac silhouette is enlarged. The pulmonary vascularity is prominent centrally but cephalization has decreased. There are post CABG changes. There is calcification in the wall of the aortic arch. IMPRESSION: Chronic bronchitic changes. Superimposed low-grade compensated CHF. No acute pneumonia. Previous CABG.  Thoracic aortic atherosclerosis. Electronically Signed   By: David  Martinique M.D.   On: 06/27/2017 07:49   Dg Chest Port 1 View  Result Date: 06/26/2017 CLINICAL DATA:  Shortness of breath, chest pain EXAM: PORTABLE CHEST 1 VIEW COMPARISON:  06/14/2017 FINDINGS: Prior CABG. Cardiomegaly. Probable scarring in the lingula and lung bases. No definite acute process. No  effusions. No acute bony abnormality. IMPRESSION: Suspect lingular and bibasilar scarring. Cardiomegaly. No active disease. Electronically Signed   By: Rolm Baptise M.D.   On: 06/26/2017 13:58    Cardiac Studies   No new data  Patient Profile     84 y.o. male with PMHx notable for CAD s/p CABG x2 in 1998 and cath 2009 w/ patent grafts, adenocarcinoma of the right lung, 12/2015 s/p SBRT and suspected radiation pneumonitis, HTN, HLD, BPH, and possible COPD.   Cath in 2017 recurring chest discomfort treated as pericarditis.  Presents now with mildly elevated troponin.   Assessment & Plan    1.  Elevated troponin, and with presentation possible non-ST elevation MI.  To have coronary angiography today to help define anatomy and guide therapy. 2.  Coronary artery disease with prior coronary bypass grafting with all arterial conduits 1998.  Coronary angiography today.  The procedure and risks including stroke, death, myocardial infarction, bleeding, allergy, and kidney injury were discussed in detail with the patient.  Also informed that if straightforward PCI can be performed on culprit stenosis, that will be done. 3.  Dyspnea related either to diastolic heart failure or COPD or a combination of the two.  Will help clarify.  BNP is mildly elevated. 4.  Diastolic heart failure.  May need more aggressive diuresis depending upon findings from cath today.  LVEDP will be an important bit of information.  Last dose of Eliquis was Sunday morning.  The patient was counseled to undergo left heart catheterization, coronary angiography, and possible percutaneous coronary intervention with stent implantation. The procedural risks and benefits were discussed in detail. The risks discussed included death, stroke, myocardial infarction, life-threatening bleeding, limb ischemia, kidney injury, allergy, and possible emergency cardiac surgery. The risk of these significant complications were estimated to occur less than  1% of the time. After discussion, the patient has agreed to proceed.  For questions or updates, please contact [place] Please consult [email or web address] for contact info under Cardiology/STEMI.      Signed, Sinclair Grooms, MD  06/27/2017, 8:28 AM

## 2017-06-27 NOTE — Progress Notes (Signed)
Site area: Right groin a 5 french arterial sheath was removed  Site Prior to Removal:  Level 1- bleeding/hematoma  Pressure Applied For 30 MINUTES    Bedrest Beginning at 1145am  Manual:   Yes.    Patient Status During Pull:  stable  Post Pull Groin Site:  Level 0  Post Pull Instructions Given:  Yes.    Post Pull Pulses Present:  Yes.    Dressing Applied:  Yes.    Comments:  VS remain stable

## 2017-06-27 NOTE — H&P (Signed)
  Cardiology Admission History and Physical:   Patient ID: Evan [name]; MRN: [id number]; DOB: [birth year]   Admission date: 06/26/2017  Primary Care Provider: [name], MD Primary Cardiologist: Irish Lack Primary Electrophysiologist: None  Chief Complaint: Chest pain  Patient Profile:   Evan [name] is a 84 y.o. male with a history of CAD s/p CABG x2 in 1998 and cath 2009 w/ patent stents, adenocarcinoma of the right lung, 12/2015 s/p SBRT and suspected radiation pneumonitis, HTN, HLD, BPH, and possible COPD.    Please refer to the "Cardiology Consultation Note".  The note was prepared by Dr. Berline Lopes under my supervision. My thoughts are as follows:  "Patient has history of paroxysmal atrial fibrillation with last dose of Eliquis this morning.  Was seen in pulmonology and sent to the emergency room because of concern for ACS.  Has a personal prior history of coronary grafting with arterial conduits in 1998.  Grafts were documented to be patent by angiography in 2009.  Over the past 3-4 months he has been troubled by recurring chest discomfort and progressive dyspnea..  There are clinical features of pericarditis with improvement in chest pain on anti-inflammatory therapy including colchicine.  Major presenting complaint is chest discomfort and dyspnea with activity which can also awaken the patient from sleep.  No pericardial friction rub is heard.  ECG reveals diffuse ST-T wave abnormality.  Concern at this time is for the possibility of ACS versus nonischemic chest pain syndrome such as recurrent pericarditis.  Plan to perform echocardiography as the patient has a prior diagnosis of pericarditis and is on chronic anticoagulation therapy with Eliquis.  We need to exclude pericardial effusion.  Cardiac markers will be cycled.  Coronary angiography tomorrow afternoon when he will be greater than 24 hours out from last dose of Eliquis.  Procedure would need to be done as a radial  approach case from left arm."      Signed, Sinclair Grooms, MD  06/27/2017 5:09 PM

## 2017-06-28 ENCOUNTER — Inpatient Hospital Stay (HOSPITAL_COMMUNITY): Payer: Medicare HMO

## 2017-06-28 MED ORDER — METOPROLOL SUCCINATE ER 25 MG PO TB24
25.0000 mg | ORAL_TABLET | Freq: Every day | ORAL | Status: DC
  Administered 2017-06-28: 25 mg via ORAL
  Filled 2017-06-28: qty 1

## 2017-06-28 MED ORDER — METOPROLOL SUCCINATE ER 25 MG PO TB24: 25.0000 mg | ORAL_TABLET | Freq: Every day | ORAL | 1 refills | Status: DC

## 2017-06-28 MED FILL — Lidocaine HCl Local Inj 1%: INTRAMUSCULAR | Qty: 20 | Status: AC

## 2017-06-28 NOTE — Discharge Instructions (Addendum)
Please hold your eliquis until Thursday June 29, 2017, per your cardiology team. This will help to reduce the amount of bruising at your cath site (groin).  THE ONLY MEDICATIONS LEFT TO TAKE TODAY (Wednesday June 28, 2017) ARE:  ATIVAN THIS AFTERNOON/EVENING  ROUVASTATIN THIS EVENING/BEDTIME

## 2017-06-28 NOTE — Progress Notes (Signed)
Patient resting comfortably during shift report. Denies complaints.  

## 2017-06-28 NOTE — Plan of Care (Signed)
  Problem: Safety: Goal: Ability to remain free from injury will improve Outcome: Progressing   Problem: Pain Managment: Goal: General experience of comfort will improve Outcome: Progressing   Problem: Education: Goal: Knowledge of General Education information will improve Outcome: Progressing

## 2017-06-28 NOTE — Progress Notes (Signed)
Call placed to [facility] to notify of telemetry monitoring d/c.   

## 2017-06-28 NOTE — Progress Notes (Signed)
Patient has been awake and says he hasn't received his ativan all day and that's probably why. Patient takes ativan TID at home- here it is PRN. RN gave ativan.

## 2017-06-28 NOTE — Progress Notes (Signed)
THN is at bedside with patient/family.

## 2017-06-28 NOTE — Progress Notes (Addendum)
Progress Note  Patient Name: Evan [name] Date of Encounter: 06/28/2017  Primary Cardiologist: [place]   Does well.  Still has some shortness of breath when he lies down.  Not complaining of chest discomfort anymore.  He was admitted with complaint of chest discomfort.  Cardiac cath performed yesterday did not demonstrate actionable disease, and medical therapy was recommended.  Inpatient Medications    Scheduled Meds: . aspirin  81 mg Oral Daily  . atenolol  25 mg Oral Daily  . colchicine  0.6 mg Oral Daily  . enoxaparin (LOVENOX) injection  40 mg Subcutaneous Q24H  . FLUoxetine  20 mg Oral Daily  . pantoprazole  40 mg Oral Daily  . rosuvastatin  5 mg Oral q1800  . sodium chloride flush  3 mL Intravenous Q12H  . tamsulosin  0.4 mg Oral Daily   Continuous Infusions: . sodium chloride Stopped (06/28/17 0730)  . sodium chloride    . sodium chloride 1 mL/kg/hr (06/27/17 1419)   PRN Meds: sodium chloride, acetaminophen, ipratropium-albuterol, LORazepam, nitroGLYCERIN, ondansetron (ZOFRAN) IV, polyvinyl alcohol, sodium chloride flush   Vital Signs    Vitals:   06/27/17 2136 06/28/17 0005 06/28/17 0442 06/28/17 0653  BP: (!) 147/84 (!) 145/80 (!) 115/55   Pulse: 61 70 60   Resp: 18 18 20    Temp: 98.1 F (36.7 C) 98 F (36.7 C) 97.8 F (36.6 C)   TempSrc: Oral Oral Oral   SpO2: 98% 95% 94%   Weight:    171 lb 9.6 oz (77.8 kg)  Height:        Intake/Output Summary (Last 24 hours) at 06/28/2017 0838 Last data filed at 06/28/2017 0802 Gross per 24 hour  Intake 4555.23 ml  Output 1695 ml  Net 2860.23 ml   Filed Weights   06/26/17 1152 06/28/17 0653  Weight: 173 lb (78.5 kg) 171 lb 9.6 oz (77.8 kg)    Telemetry    Sinus rhythm- Personally Reviewed  ECG    No repeat tracing in the past 48 hours.- Personally Reviewed  Physical Exam  Elderly and in no acute distress. GEN: No acute distress.   Neck: No JVD Cardiac: RRR, no murmurs, rubs, or  gallops.  Respiratory: Clear to auscultation bilaterally. GI: Soft, nontender, non-distended  MS: No edema; No deformity. Neuro:  Nonfocal  Psych: Normal affect   Labs    Chemistry Recent Labs  Lab 06/21/17 1617 06/26/17 1154 06/27/17 0445  NA 139 136 138  K 4.3 5.3* 4.1  CL 105 106 109  CO2 24 19* 21*  GLUCOSE 101* 99 96  BUN 34* 13 11  CREATININE 1.28 0.98 1.03  CALCIUM 9.9 8.7* 8.5*  PROT  --   --  5.6*  ALBUMIN  --   --  3.2*  AST  --   --  22  ALT  --   --  17  ALKPHOS  --   --  104  BILITOT  --   --  1.4*  GFRNONAA  --  >60 >60  GFRAA  --  >60 >60  ANIONGAP  --  11 8     Hematology Recent Labs  Lab 06/26/17 1154 06/27/17 0445  WBC 6.6 7.2  RBC 3.96* 4.05*  HGB 12.2* 12.3*  HCT 37.5* 37.4*  MCV 94.7 92.3  MCH 30.8 30.4  MCHC 32.5 32.9  RDW 14.9 14.7  PLT 93* 84*    Cardiac Enzymes Recent Labs  Lab 06/26/17 1717 06/26/17 2153 06/27/17 0445  TROPONINI 0.05* 0.06* 0.05*    Recent Labs  Lab 06/26/17 1201  TROPIPOC 0.01     BNP Recent Labs  Lab 06/21/17 1617  PROBNP 666.0*     DDimer No results for input(s): DDIMER in the last 168 hours.   Radiology    Dg Chest Port 1 View  Result Date: 06/27/2017 CLINICAL DATA:  Dyspnea. History of coronary artery disease and previous MI, previous episodes of pneumonia, nonsmoker. EXAM: PORTABLE CHEST 1 VIEW COMPARISON:  Portable chest x-ray of June 26, 2017 FINDINGS: The lungs are well-expanded. Hazy increased density seen on the previous study obscuring much of the left heart border is no longer evident. The interstitial markings are both lungs are coarse. There is no alveolar infiltrate or pleural effusion. The cardiac silhouette is enlarged. The pulmonary vascularity is prominent centrally but cephalization has decreased. There are post CABG changes. There is calcification in the wall of the aortic arch. IMPRESSION: Chronic bronchitic changes. Superimposed low-grade compensated CHF. No acute  pneumonia. Previous CABG.  Thoracic aortic atherosclerosis. Electronically Signed   By: David  Martinique M.D.   On: 06/27/2017 07:49   Dg Chest Port 1 View  Result Date: 06/26/2017 CLINICAL DATA:  Shortness of breath, chest pain EXAM: PORTABLE CHEST 1 VIEW COMPARISON:  06/14/2017 FINDINGS: Prior CABG. Cardiomegaly. Probable scarring in the lingula and lung bases. No definite acute process. No effusions. No acute bony abnormality. IMPRESSION: Suspect lingular and bibasilar scarring. Cardiomegaly. No active disease. Electronically Signed   By: Rolm Baptise M.D.   On: 06/26/2017 13:58    Cardiac Studies   Cardiac catheter results from 06/27/2017: Diagnostic Diagram      2D Doppler echocardiogram 06/28/2016: Pending   Patient Profile     84 y.o. male with history of CAD, coronary bypass grafting 1998, stent implantation 2009, adenocarcinoma right lung status post radiation therapy 2017, radiation pneumonitis, hypertension, hyperlipidemia, and possible COPD.  Assessment & Plan    1. Coronary artery disease with stable angina.  Angiography demonstrated continued patency of stents and arterial bypass grafts.  Plan continued medical therapy. 2. Chest pain, possibly anginal.  Uptitrate beta-blocker therapy which will help both with angina and possible diastolic heart failure given LV hypertrophy.  LVEDP was not elevated on cath, therefore diuretic therapy is not being used. 3. Orthopnea, uncertain etiology.  Possibly related to diastolic dysfunction.  Needs further investigation as outpatient. 4. History of paroxysmal atrial fibrillation, Eliquis therapy will be resumed at discharge.  Plan discharge today.  Resume Eliquis.  Follow-up with primary cardiologist.  Ambulate prior to discharge.  Bmet today prior to discharge.  Switch atenolol to metoprolol succinate 25 mg daily and uptitrate to 50 mg/day as outpatient if possible based upon blood pressure and clinical status.  For questions or updates,  please contact [place] Please consult [email or web address] for contact info under Cardiology/STEMI.      Signed, Sinclair Grooms, MD  06/28/2017, 8:38 AM

## 2017-06-28 NOTE — Progress Notes (Signed)
Right Groin evaluated during shift report; per night RN, ecchymosis at site was present for the most part. The faint, lateral extension of the ecchymosis is new. There is no elevation/raised areas of the bruising. No external leaking/bleeding. Pt does not report pain at site.

## 2017-06-28 NOTE — Discharge Summary (Addendum)
The patient has been seen in conjunction with Reino Bellis, [facility]. All aspects of care have been considered and discussed. The patient has been personally interviewed, examined, and all clinical data has been reviewed.   See my note from earlier in the day.  Continue current medical regimen as outlined below.  Follow-up with primary cardiologist for further adjustment in therapy.  Discharge Summary    Patient ID: Evan [name],  MRN: [id number], DOB/AGE: 1928-03-20 84 y.o.  Admit date: 06/26/2017 Discharge date: 06/28/2017  Primary Care Provider: [name] T Primary Cardiologist: Dr. Irish Lack   Discharge Diagnoses    Principal Problem:   Non-ST elevation MI (NSTEMI) [facility]) Active Problems:   S/P CABG (coronary artery bypass graft)   Angina pectoris (HCC)   Dyspnea   Diastolic CHF ([place])   Chest pain due to CAD   Allergies Allergies  Allergen Reactions  . Feldene [Piroxicam] Other (See Comments)    REACTION: Blisters  . Statins Other (See Comments)    Myalgias  . Doxycycline Other (See Comments)    REACTION:  unknown  . Latex Rash  . Tequin Anxiety  . Valium Other (See Comments)    REACTION:  unknown    Diagnostic Studies/Procedures    Cath: 06/27/17  Conclusion     Prox RCA lesion is 30% stenosed.  Mid RCA lesion is 10% stenosed.  Ost 1st Mrg lesion is 80% stenosed.  Previously placed Prox Cx stent (unknown type) is widely patent.  Ost 2nd Mrg lesion is 65% stenosed.  Ost LAD lesion is 100% stenosed.  Previously placed Origin to Prox Graft stent (unknown type) is widely patent.  The left ventricular ejection fraction is 55-65% by visual estimate.  LV end diastolic pressure is normal.  The left ventricular systolic function is normal.   Hyperdynamic LV function with a spade-like ventricle and ejection fraction at 60-65%.  Multi vessel native CAD with total occlusion of the very proximal LAD; 75% ostial stenosis of the OM1 vessel with  a patent LCX stent between the OM1 and OM 2 vessels, and 65-70% ostial narrowing at the origin of the OM 2 vessel; dominant RCA with 30% proximal and 10% mid stenosis.  Widely patent free radial graft supplying the OM1 vessel with a patent ostial proximal stent in the graft.  Patent LIMA graft supplying the LAD system.  RECOMMENDATION: Medical therapy.   _____________   History of Present Illness     84 year old male who presented to [facility] ED with several days of progressively worsening left-sided substernal chest pain and dyspnea. The dyspnea was worse laying flat for prolonged periods of time and improved notably with his home duoneb breathing treatments. Walking decreased the chest pain but increased the dyspnea. The patient attested to such symptoms times 2 months.  He was prescribed a course of prednisone for symptoms which dramatically improved the dyspnea but following completion of the prednisone the dyspnea again occurred and progressively worsened.  At a visit with his primary pulmonologist office the morning of admission an EKG was performed which was concerning for possible ST segment changes prompting recommendation for an ED visit for evaluation of his dyspnea and chest pain. The patient is typically able to walk without chest pain at baseline and notes that the dyspnea is near 3am each day after lying flat for several hours.  In the ED the patient's i-STAT troponin was 0.01, EKG was remarkable only for minimal ST depression in the inferolateral leads.  Chest x-ray  was read as possible suspected lingular and bibasilar scarring with cardiomegaly no active disease, however, there is notable increased opacity of the left lower lobe compared to previous imaging completed on 06/14/2017.  CBC was remarkable for mild thrombocytopenia of 93.K, and mild anemia of 12.2.  BMP was remarkable for sodium 136 and hemolyzed specimen indicating potassium 5.3.  Patient's most recent BNP in 5 days  prior was noted to be elevated above 360. Given the concern for possible ACS decision was made to admit him and take for cardiac cath.   Hospital Course     Underwent cardiac cath noted above with continued patency of stents and bypass grafts. Plan is to continue with medical therapy. LVEDP was not elevated on cath. His home atenolol was switched to Toprol 25mg  daily. Labs the following morning were stable. No further chest pain. Able to ambulate in the hallway. The etiology of his orthopnea was uncertain, but felt to possibly be related to diastolic dysfunction with need for further investigation as an outpatient. Cath site stable and Eliquis was resumed the following day.    [name] was seen by Dr. Tamala Julian and determined stable for discharge home. Follow up in the office has been arranged. Medications are listed below.   _____________  Discharge Vitals Blood pressure (!) 115/55, pulse 60, temperature 97.8 F (36.6 C), temperature source Oral, resp. rate 20, height 5\' 6"  (1.676 m), weight 171 lb 9.6 oz (77.8 kg), SpO2 94 %.  Filed Weights   06/26/17 1152 06/28/17 0653  Weight: 173 lb (78.5 kg) 171 lb 9.6 oz (77.8 kg)    Labs & Radiologic Studies    CBC Recent Labs    06/26/17 1154 06/27/17 0445  WBC 6.6 7.2  HGB 12.2* 12.3*  HCT 37.5* 37.4*  MCV 94.7 92.3  PLT 93* 84*   Basic Metabolic Panel Recent Labs    06/26/17 1154 06/26/17 1717 06/27/17 0445  NA 136  --  138  K 5.3*  --  4.1  CL 106  --  109  CO2 19*  --  21*  GLUCOSE 99  --  96  BUN 13  --  11  CREATININE 0.98  --  1.03  CALCIUM 8.7*  --  8.5*  MG  --  2.0  --    Liver Function Tests Recent Labs    06/27/17 0445  AST 22  ALT 17  ALKPHOS 104  BILITOT 1.4*  PROT 5.6*  ALBUMIN 3.2*   No results for input(s): LIPASE, AMYLASE in the last 72 hours. Cardiac Enzymes Recent Labs    06/26/17 1717 06/26/17 2153 06/27/17 0445  TROPONINI 0.05* 0.06* 0.05*   BNP Invalid input(s): POCBNP D-Dimer No  results for input(s): DDIMER in the last 72 hours. Hemoglobin A1C No results for input(s): HGBA1C in the last 72 hours. Fasting Lipid Panel No results for input(s): CHOL, HDL, LDLCALC, TRIG, CHOLHDL, LDLDIRECT in the last 72 hours. Thyroid Function Tests No results for input(s): TSH, T4TOTAL, T3FREE, THYROIDAB in the last 72 hours.  Invalid input(s): FREET3 _____________  Dg Chest 2 View  Result Date: 06/14/2017 CLINICAL DATA:  84 y/o M; 1 month of shortness of breath worsening in the last 2 days. History of right lung cancer. EXAM: CHEST - 2 VIEW COMPARISON:  05/10/2017 chest radiograph.  04/20/2017 CT chest. FINDINGS: Stable cardiac silhouette given projection and technique. Aortic atherosclerosis with calcification. Interval increase in reticular opacities and trace pleural fluid tracking along fissures. Stable opacity right posterior lung  base. Post median sternotomy with wires in alignment. No acute osseous abnormality identified. IMPRESSION: Increase reticular opacities and pleural fluid tracking along fissures probably representing interstitial edema. Stable right lung base opacity probably representing sequelae of prior pneumonia. Electronically Signed   By: Kristine Garbe M.D.   On: 06/14/2017 14:49   Dg Chest Port 1 View  Result Date: 06/27/2017 CLINICAL DATA:  Dyspnea. History of coronary artery disease and previous MI, previous episodes of pneumonia, nonsmoker. EXAM: PORTABLE CHEST 1 VIEW COMPARISON:  Portable chest x-ray of June 26, 2017 FINDINGS: The lungs are well-expanded. Hazy increased density seen on the previous study obscuring much of the left heart border is no longer evident. The interstitial markings are both lungs are coarse. There is no alveolar infiltrate or pleural effusion. The cardiac silhouette is enlarged. The pulmonary vascularity is prominent centrally but cephalization has decreased. There are post CABG changes. There is calcification in the wall of the  aortic arch. IMPRESSION: Chronic bronchitic changes. Superimposed low-grade compensated CHF. No acute pneumonia. Previous CABG.  Thoracic aortic atherosclerosis. Electronically Signed   By: David  Martinique M.D.   On: 06/27/2017 07:49   Dg Chest Port 1 View  Result Date: 06/26/2017 CLINICAL DATA:  Shortness of breath, chest pain EXAM: PORTABLE CHEST 1 VIEW COMPARISON:  06/14/2017 FINDINGS: Prior CABG. Cardiomegaly. Probable scarring in the lingula and lung bases. No definite acute process. No effusions. No acute bony abnormality. IMPRESSION: Suspect lingular and bibasilar scarring. Cardiomegaly. No active disease. Electronically Signed   By: Rolm Baptise M.D.   On: 06/26/2017 13:58   Disposition   Pt is being discharged home today in good condition.  Follow-up Plans & Appointments    Follow-up Information    Jettie Booze, MD Follow up on 07/07/2017.   Specialties:  Cardiology, Radiology, Interventional Cardiology Why:  at 9:20am for your follow up appt.  Contact information: [address] [place] [address] [phone]          Discharge Instructions    Call MD for:  redness, tenderness, or signs of infection (pain, swelling, redness, odor or green/yellow discharge around incision site)   Complete by:  As directed    Diet - low sodium heart healthy   Complete by:  As directed    Discharge instructions   Complete by:  As directed    Groin Site Care Refer to this sheet in the next few weeks. These instructions provide you with information on caring for yourself after your procedure. Your caregiver may also give you more specific instructions. Your treatment has been planned according to current medical practices, but problems sometimes occur. Call your caregiver if you have any problems or questions after your procedure. HOME CARE INSTRUCTIONS You may shower 24 hours after the procedure. Remove the bandage (dressing) and gently wash the site with plain soap  and water. Gently pat the site dry.  Do not apply powder or lotion to the site.  Do not sit in a bathtub, swimming pool, or whirlpool for 5 to 7 days.  No bending, squatting, or lifting anything over 10 pounds (4.5 kg) as directed by your caregiver.  Inspect the site at least twice daily.  Do not drive home if you are discharged the same day of the procedure. Have someone else drive you.  You may drive 24 hours after the procedure unless otherwise instructed by your caregiver.  What to expect: Any bruising will usually fade within 1 to 2 weeks.  Blood  that collects in the tissue (hematoma) may be painful to the touch. It should usually decrease in size and tenderness within 1 to 2 weeks.  SEEK IMMEDIATE MEDICAL CARE IF: You have unusual pain at the groin site or down the affected leg.  You have redness, warmth, swelling, or pain at the groin site.  You have drainage (other than a small amount of blood on the dressing).  You have chills.  You have a fever or persistent symptoms for more than 72 hours.  You have a fever and your symptoms suddenly get worse.  Your leg becomes pale, cool, tingly, or numb.  You have heavy bleeding from the site. Hold pressure on the site. .  We switched your atenolol to Toprol this admission. Please make sure that you do not take both of these medications as they are in the same class. Please keep track of your blood pressures at home and bring to your follow up appt.   Increase activity slowly   Complete by:  As directed       Discharge Medications     Medication List    STOP taking these medications   atenolol 25 MG tablet Commonly known as:  TENORMIN     TAKE these medications   acetaminophen 500 MG tablet Commonly known as:  TYLENOL Take 500 mg by mouth every 6 (six) hours as needed for mild pain.   aerochamber plus with mask inhaler Use as instructed   apixaban 5 MG Tabs tablet Commonly known as:  ELIQUIS Take 1 tablet (5 mg total) by  mouth 2 (two) times daily.   Cholecalciferol 2000 units Tabs Take 1 tablet (2,000 Units total) by mouth daily.   colchicine 0.6 MG tablet Take 1 tablet (0.6 mg total) by mouth daily.   FLUoxetine 20 MG capsule Commonly known as:  PROZAC TAKE 20 mg  BY MOUTH DAILY   furosemide 40 MG tablet Commonly known as:  LASIX Take 1 tablet (40 mg total) by mouth daily.   ipratropium-albuterol 0.5-2.5 (3) MG/3ML Soln Commonly known as:  DUONEB Take 3 mLs by nebulization every 4 (four) hours as needed (or Chest Pain / Tightness).   LORazepam 1 MG tablet Commonly known as:  ATIVAN TAKE ONE (1) TABLET BY MOUTH 3 TIMES DAILY   metoprolol succinate 25 MG 24 hr tablet Commonly known as:  TOPROL-XL Take 1 tablet (25 mg total) by mouth daily.   nitroGLYCERIN 0.4 MG SL tablet Commonly known as:  NITROSTAT Place 1 tablet (0.4 mg total) under the tongue every 5 (five) minutes as needed for chest pain.   pantoprazole 40 MG tablet Commonly known as:  PROTONIX Take one (1) tablet (40 mg) by mouth daily.   rosuvastatin 5 MG tablet Commonly known as:  CRESTOR TAKE 5 mg  EVERY DAY(NEED TO SCHEDULE YEARLY APPOINTMENT FOR AUGUST )   tamsulosin 0.4 MG Caps capsule Commonly known as:  FLOMAX TAKE 0.4 gm by mouth EVERY DAY   VITAMIN B-12 PO Take 1 tablet by mouth daily.   XIIDRA 5 % Soln Generic drug:  Lifitegrast Place 1-2 drops into both eyes daily.       Outstanding Labs/Studies   N/a  Duration of Discharge Encounter   Greater than 30 minutes including physician time.  Signed, Reino Bellis NP-C 06/28/2017, 9:30 AM

## 2017-06-28 NOTE — Progress Notes (Signed)
Patients family emptying urinal without notifying staff, patient and family member educated.

## 2017-06-28 NOTE — Consult Note (Signed)
[facility] CM Primary Care Navigator  06/28/2017  Evan [name] 06/01/1927 [id number]  Met withpatient, daughters Evan [name] and Evan [name]) and son-in law at the bedside to identify possible discharge needs.  Patientreports having"difficulty breathing and chest tightness"that had led to this admission. (status post cardiac catheterization, Non-ST elevation myocardial infarction)  Patient endorses [name] with [facility] as his primary care provider.   [facility] [place] [facility] [address] [facility] difficulty so far. Daughter indicated plan to talk with patient's eye doctor for any possible substitute/ alternative for his eyedrops ("used for dry eyes"). Patient and family are aware to seek assistance from primary care provider for any future issues or concerns with patient's medications.  Daughter Evan [name]) has been managing patient's medications at home with use of "pill box" system filled once a week.  His daughter also providestransportation to hisdoctors'appointments. [employer] transportation benefits discussed with patient and family as well.  Patient lives at home with daughter Evan [name]) who will serve as his primary caregiver after discharge.  Anticipated discharge planis homeper patient.  Patientand daughters voiced understanding to callprimary care provider's officewhenhe gets home,for a post discharge follow-upvisit within 1- 2 weeks or sooner if needs arise.Patient letter (with PCP's contact number) was provided asareminder.  During this hospitalization, patient's dyspnea was thought to be related either to diastolic heart failure or COPD or a combination of the two, with elevated BNP. Per MD note, etiology of his orthopnea was uncertain, but felt to possibly be related to diastolic dysfunction.    Explained to patient about [facility] CM services  available for health management at home.Patient verbalized having a weighing scale at home and was encouraged to monitor/ record daily weights and report weight gains as necessary. Discussed medication adherence, maintaining diet restriction and follow-up with providers as needed. Patient mentioned that "COPD rarely bothered" him and aware to use breathing medication when needed as well as follow-up with provider.  Patient voiced preference and verbally agreed for [facility] HF callsto follow-up with hisrecovery at home.  Referral was made for EMMI HFcalls after discharge.  Patient and family expressed understanding to seekreferral to [facility] care managementfrom primary care provider if deemed necessary and appropriate for any further servicesin the future.   [facility] care management information provided for future needs that may arise.   For additional questions please contact:  Edwena Felty A. Aislyn Hayse, BSN, RN-BC [facility] PRIMARY CARE Navigator Cell: [phone]

## 2017-06-28 NOTE — Plan of Care (Signed)
Progressing

## 2017-06-29 NOTE — Progress Notes (Signed)
Reviewed, agree 

## 2017-07-05 DIAGNOSIS — J449 Chronic obstructive pulmonary disease, unspecified: Secondary | ICD-10-CM | POA: Diagnosis not present

## 2017-07-05 DIAGNOSIS — R0602 Shortness of breath: Secondary | ICD-10-CM | POA: Diagnosis not present

## 2017-07-05 DIAGNOSIS — J4520 Mild intermittent asthma, uncomplicated: Secondary | ICD-10-CM | POA: Diagnosis not present

## 2017-07-05 DIAGNOSIS — R296 Repeated falls: Secondary | ICD-10-CM | POA: Diagnosis not present

## 2017-07-05 DIAGNOSIS — R269 Unspecified abnormalities of gait and mobility: Secondary | ICD-10-CM | POA: Diagnosis not present

## 2017-07-06 NOTE — Progress Notes (Signed)
Cardiology Office Note   Date:  07/07/2017   ID:  Evan [name], DOB [birth year], MRN [id number]  PCP:  [name], MD    No chief complaint on file.  CAD  Wt Readings from Last 3 Encounters:  07/07/17 172 lb (78 kg)  06/28/17 171 lb 9.6 oz (77.8 kg)  06/26/17 176 lb 12.8 oz (80.2 kg)       History of Present Illness: Evan [name] is a 84 y.o. male  with history of CAD status post CABG x2 in 1998 and cath in 2009 showed patent stents and grafts.Low risk nuclear stress test 2017 he also has history of lung cancer status post radiation therapy.  Hospitalization 12/2016 with pericarditis. 2D echo showed normal LVEF with mild AS, no pericardial effusion.He was noted to be in atrial fibrillation with spontaneous conversion to normal sinus rhythm. He was placed on high-dose aspirin and colchicine. He was not started on NOAC'sbecause of the need to be on high-dose aspirin with pericarditis. Plavix was stopped.He was still having chest pain on 01/31/17. He was told he could use NSAIDs in the morning with breakfast every couple of days.He was back in atrial fibrillation rate controlled. He was started on Eliquis 5 mg twice daily for CHA2DS2-VASc score of 4.  In 2018,  48-hour Holter monitor was ordered which showed adequate rate control.  He was seen by pulmonary and they were concerned about [place] in 3/19  H ewas admitted and had cath showing: Hyperdynamic LV function with a spade-like ventricle and ejection fraction at 60-65%.  Multi vessel native CAD with total occlusion of the very proximal LAD; 75% ostial stenosis of the OM1 vessel with a patent LCX stent between the OM1 and OM 2 vessels, and 65-70% ostial narrowing at the origin of the OM 2 vessel; dominant RCA with 30% proximal and 10% mid stenosis.  Widely patent free radial graft supplying the OM1 vessel with a patent ostial proximal stent in the graft.  Patent LIMA graft supplying the LAD  system.  RECOMMENDATION: Medical therapy.  Past Medical History:  Diagnosis Date  . Adenocarcinoma of right lung ([place]) 01/06/2016  . Anginal chest pain at rest [facility])    Chronic non, controlled on Lorazepam  . Anxiety   . Arthritis    "all over"   . BPH (benign prostatic hyperplasia)   . CAD (coronary artery disease)   . Chronic lower back pain   . Complication of anesthesia    "problems making water afterwards"  . Depression   . DJD (degenerative joint disease)   . Esophageal ulcer without bleeding    bid ppi indefinitely  . Family history of adverse reaction to anesthesia    "children w/PONV"  . GERD (gastroesophageal reflux disease)   . Headache    "couple/week maybe" (09/04/2015)  . Hemochromatosis    Possible, elevated iron stores, hook-like osteophytes on hand films, normal LFTs  . Hepatitis    "yellow jaundice as a baby"  . History of ASCVD    MULTIVESSEL  . Hyperlipidemia   . Hypertension   . Myocardial infarction ([place]) 1999  . Osteoarthritis   . Pneumonia    "several times; got a little now" (09/04/2015)  . Rheumatoid arthritis ([place])   . Subdural hematoma ([facility])    small after fall 09/2016-plavix held, neurosurgery consulted.  Asymptomatic.      Past Surgical History:  Procedure Laterality Date  . ARM SKIN LESION BIOPSY / EXCISION Left 09/02/2015  .  CATARACT EXTRACTION W/ INTRAOCULAR LENS  IMPLANT, BILATERAL Bilateral   . CHOLECYSTECTOMY OPEN    . CORNEAL TRANSPLANT Bilateral    "one at [facility]; one at [facility]"  . CORONARY ANGIOPLASTY WITH STENT PLACEMENT    . CORONARY ARTERY BYPASS GRAFT  1999  . DESCENDING AORTIC ANEURYSM REPAIR W/ STENT     DE stent ostium into the right radial free graft at OM-, 02-2007  . ESOPHAGOGASTRODUODENOSCOPY N/A 11/09/2014   Procedure: ESOPHAGOGASTRODUODENOSCOPY (EGD);  Surgeon: [name], MD;  Location: [facility] ENDOSCOPY;  Service: Endoscopy;  Laterality: N/A;  . INGUINAL HERNIA REPAIR    . JOINT REPLACEMENT    . LEFT HEART CATH AND  CORS/GRAFTS ANGIOGRAPHY N/A 06/27/2017   Procedure: LEFT HEART CATH AND CORS/GRAFTS ANGIOGRAPHY;  Surgeon: [name], MD;  Location: [facility] CV LAB;  Service: Cardiovascular;  Laterality: N/A;  . NASAL SINUS SURGERY    . SHOULDER OPEN ROTATOR CUFF REPAIR Right   . TOTAL KNEE ARTHROPLASTY Bilateral      Current Outpatient Medications  Medication Sig Dispense Refill  . acetaminophen (TYLENOL) 500 MG tablet Take 500 mg by mouth every 6 (six) hours as needed for mild pain.    Marland Kitchen amoxicillin (AMOXIL) 500 MG capsule Take 500 mg by mouth every 8 (eight) hours. For 7 days  0  . apixaban (ELIQUIS) 5 MG TABS tablet Take 1 tablet (5 mg total) by mouth 2 (two) times daily. 60 tablet 11  . chlorhexidine (PERIDEX) 0.12 % solution SWISH AND SPIT 10ML BY MOUTH TWICE A DAY  0  . Cholecalciferol 2000 units TABS Take 1 tablet (2,000 Units total) by mouth daily. 30 each 11  . colchicine 0.6 MG tablet Take 1 tablet (0.6 mg total) by mouth daily. 30 tablet 11  . Cyanocobalamin (VITAMIN B-12 PO) Take 1 tablet by mouth daily.    Marland Kitchen FLUoxetine (PROZAC) 20 MG capsule TAKE 20 mg  BY MOUTH DAILY 90 capsule 1  . ipratropium-albuterol (DUONEB) 0.5-2.5 (3) MG/3ML SOLN Take 3 mLs by nebulization every 4 (four) hours as needed (or Chest Pain / Tightness). 360 mL 3  . leflunomide (ARAVA) 10 MG tablet Take 10 mg by mouth daily.    Marland Kitchen Lifitegrast (XIIDRA) 5 % SOLN Place 1-2 drops into both eyes daily.    Marland Kitchen LORazepam (ATIVAN) 1 MG tablet TAKE ONE (1) TABLET BY MOUTH 3 TIMES DAILY 90 tablet 1  . metoprolol succinate (TOPROL-XL) 25 MG 24 hr tablet Take 1 tablet (25 mg total) by mouth daily. 30 tablet 1  . nitroGLYCERIN (NITROSTAT) 0.4 MG SL tablet Place 1 tablet (0.4 mg total) under the tongue every 5 (five) minutes as needed for chest pain. 25 tablet 2  . pantoprazole (PROTONIX) 40 MG tablet Take one (1) tablet (40 mg) by mouth daily. 90 tablet 3  . rosuvastatin (CRESTOR) 5 MG tablet TAKE 5 mg  EVERY DAY(NEED TO SCHEDULE  YEARLY APPOINTMENT FOR AUGUST ) 90 tablet 3  . Spacer/Aero-Holding Dorise Bullion by Does not apply route.    . tamsulosin (FLOMAX) 0.4 MG CAPS capsule TAKE 0.4 gm by mouth EVERY DAY 90 capsule 3   No current facility-administered medications for this visit.     Allergies:   Feldene [piroxicam]; Statins; Doxycycline; Latex; Tequin; and Valium    Social History:  The patient  reports that he has never smoked. He has quit using smokeless tobacco. His smokeless tobacco use included chew. He reports that he does not drink alcohol or use drugs.   Family  History:  The patient's family history includes Arthritis-Osteo in his sister; Heart attack in his brother and other.    ROS:  Please see the history of present illness.   Otherwise, review of systems are positive for right groin bruising.   All other systems are reviewed and negative.    PHYSICAL EXAM: VS:  BP 120/70   Pulse 81   Ht 5\' 6"  (1.676 m)   Wt 172 lb (78 kg)   SpO2 98%   BMI 27.76 kg/m  , BMI Body mass index is 27.76 kg/m. GEN: Well nourished, well developed, in no acute distress  HEENT: normal  Neck: no JVD, carotid bruits, or masses Cardiac: RRR; no murmurs, rubs, or gallops,no edema  Respiratory:  clear to auscultation bilaterally, normal work of breathing GI: soft, nontender, nondistended, + BS MS: no deformity or atrophy , severe right thigh/groin bruising; no bruit on right femoral; right foot appears well perfused Skin: warm and dry, no rash Neuro:  Strength and sensation are intact Psych: euthymic mood, full affect    Recent Labs: 01/17/2017: TSH 1.61 05/11/2017: B Natriuretic Peptide 454.7 06/21/2017: Pro B Natriuretic peptide (BNP) 666.0 06/26/2017: Magnesium 2.0 06/27/2017: ALT 17; BUN 11; Creatinine, Ser 1.03; Hemoglobin 12.3; Platelets 84; Potassium 4.1; Sodium 138   Lipid Panel    Component Value Date/Time   CHOL 113 12/21/2016 0211   CHOL 97 09/09/2013 0957   TRIG 98 12/21/2016 0211   TRIG 92  09/09/2013 0957   HDL 28 (L) 12/21/2016 0211   HDL 37 (L) 09/09/2013 0957   CHOLHDL 4.0 12/21/2016 0211   VLDL 20 12/21/2016 0211   LDLCALC 65 12/21/2016 0211   LDLCALC 42 09/09/2013 0957     Other studies Reviewed: Additional studies/ records that were reviewed today with results demonstrating: cath report from 3/19 reviewed.   ASSESSMENT AND PLAN:  1. CAD: Resume Crestor 5 mg daily.  Medical therapy based on 3/19 cath result.  Angina controlled with medical therapy.  I think some of his symptoms are also noncardiac.  He has some SHOB.  LVEDP was normal.  I do not think it is fluid overload.  More Likely pulmonary cause for [facility]. 2. Pericarditis: Colchicine helped his sx in the past.  3. AFib/flutter: Plavix was stopped.  Eliquis for stroke prevention.  He needs CBC adnd BMet every 6 months. 4. Lung cancer: Followed by pulmonary. 5. LDL 65 in 9/18.   Current medicines are reviewed at length with the patient today.  The patient concerns regarding his medicines were addressed.  The following changes have been made:  No change  Labs/ tests ordered today include:  No orders of the defined types were placed in this encounter.   Recommend 150 minutes/week of aerobic exercise Low fat, low carb, high fiber diet recommended  Disposition:   FU in 1 year   Signed, Larae Grooms, MD  07/07/2017 9:59 AM    [facility] [place], [place], [place]  [address] Phone: [phone]; Fax: [phone]

## 2017-07-07 ENCOUNTER — Encounter: Payer: Self-pay | Admitting: Interventional Cardiology

## 2017-07-07 ENCOUNTER — Other Ambulatory Visit: Payer: Self-pay | Admitting: Family Medicine

## 2017-07-07 ENCOUNTER — Ambulatory Visit: Payer: Medicare HMO | Admitting: Interventional Cardiology

## 2017-07-07 VITALS — BP 120/70 | HR 81 | Ht 66.0 in | Wt 172.0 lb

## 2017-07-07 DIAGNOSIS — I25118 Atherosclerotic heart disease of native coronary artery with other forms of angina pectoris: Secondary | ICD-10-CM

## 2017-07-07 DIAGNOSIS — I300 Acute nonspecific idiopathic pericarditis: Secondary | ICD-10-CM

## 2017-07-07 DIAGNOSIS — I4891 Unspecified atrial fibrillation: Secondary | ICD-10-CM

## 2017-07-07 DIAGNOSIS — Z951 Presence of aortocoronary bypass graft: Secondary | ICD-10-CM | POA: Diagnosis not present

## 2017-07-07 MED ORDER — FUROSEMIDE 40 MG PO TABS: 40.0000 mg | ORAL_TABLET | Freq: Every day | ORAL | 0 refills | Status: DC | PRN

## 2017-07-07 MED ORDER — ROSUVASTATIN CALCIUM 5 MG PO TABS: ORAL_TABLET | ORAL | 3 refills | Status: DC

## 2017-07-07 NOTE — Patient Instructions (Signed)
Medication Instructions:  Your physician has recommended you make the following change in your medication:   1. START: furosemide (lasix) 40 mg tablet: Take 1 tablet (40 mg) once daily AS NEEDED for swelling   Labwork: None ordered  Testing/Procedures: None ordered  Follow-Up: Your physician wants you to follow-up in: 1 year with Dr. Irish Lack. You will receive a reminder letter in the mail two months in advance. If you don't receive a letter, please call our office to schedule the follow-up appointment.   Any Other Special Instructions Will Be Listed Below (If Applicable).     If you need a refill on your cardiac medications before your next appointment, please call your pharmacy.

## 2017-07-07 NOTE — Telephone Encounter (Signed)
Requesting refill Lorazepam      LOV: 05/04/2017  LRF:  04/28/17

## 2017-07-12 ENCOUNTER — Other Ambulatory Visit: Payer: Self-pay

## 2017-07-12 NOTE — Patient Outreach (Signed)
[facility]) Care Management  07/12/2017  Evan [name] Aug 20, 1927 [id number]  EMMI: heart failure red alert Referral date: 07/11/17 Referral reason: weighed themselves today: no Day # 12  Telephone call to patient regarding EMMI heart failure red alert. Unable to reach patient due to phone only ringing. Unable to leave voice message.    PLAN:  RNCM will attempt 2nd telephone call to patient within 4 business days.   Quinn Plowman RN,BSN,CCM [name] Telephonic  [phone]

## 2017-07-18 ENCOUNTER — Other Ambulatory Visit: Payer: Self-pay

## 2017-07-18 NOTE — Patient Outreach (Signed)
[facility]) Care Management  07/18/2017  Evan [name] [birth year] [id number]    EMMI: heart failure red alert Referral date: 07/11/17 Referral reason: weighed themselves today: no Day # 2  Telephone call to patient regarding EMMI heart failure red alert. Unable to reach patient due to phone only ringing. Unable to leave voice message.    PLAN:  RNCM will attempt 3rd telephone call to patient within 4 business days.   Quinn Plowman RN,BSN,CCM [facility] Telephonic  [phone]

## 2017-07-21 ENCOUNTER — Other Ambulatory Visit: Payer: Self-pay

## 2017-07-21 NOTE — Patient Outreach (Signed)
[facility]) Care Management  07/21/2017  GRANT SWAGER 10/10/1927 [id number]   EMMI:heart failure red alert Referral date:07/11/17 Referral reason:weighed themselves today: no  Telephone call to patient regarding  EMMI heart failure red alert. HIPAA verified by patient. Explained reason for call.  Patient states he is doing well.  Denies having any symptoms. Patient states he does not weigh daily. Patient states he has never been instructed to weigh.  Patient reports he has all of his medications and is taking them as prescribed. Patient states he lives with his daughter who assist him with his care when needed. Patient states he thinks he has appointments set up to see his doctors. Patient states his daughter handles all of that. States he has transportation to his appointments. Patient gave verbal authorization to speak with his daughter [name]  Regarding his health information if needed.  RNCM reviewed signs/ and symptoms of heart failure with patient. Advised patient to weigh and record weight daily.  RNCM advised patient to continue to take his medications as prescribed and keep follow up appointment with his doctors.  RNCM advised patient to notify MD of any changes in condition prior to scheduled appointment. RNCM verified patient aware of 911 services for urgent/ emergent needs. Patient request contact information for [facility] care management and 24 hour nurse line be mailed to him.  Patient verbally agreed to ongoing EMMI heart failure calls.   PLAn: RNCM will close patient due to patient being assessed and having no further needs.

## 2017-08-04 DIAGNOSIS — R296 Repeated falls: Secondary | ICD-10-CM | POA: Diagnosis not present

## 2017-08-04 DIAGNOSIS — J449 Chronic obstructive pulmonary disease, unspecified: Secondary | ICD-10-CM | POA: Diagnosis not present

## 2017-08-04 DIAGNOSIS — R269 Unspecified abnormalities of gait and mobility: Secondary | ICD-10-CM | POA: Diagnosis not present

## 2017-08-04 DIAGNOSIS — R0602 Shortness of breath: Secondary | ICD-10-CM | POA: Diagnosis not present

## 2017-08-04 DIAGNOSIS — J4520 Mild intermittent asthma, uncomplicated: Secondary | ICD-10-CM | POA: Diagnosis not present

## 2017-08-17 ENCOUNTER — Telehealth: Payer: Self-pay | Admitting: *Deleted

## 2017-08-17 NOTE — Telephone Encounter (Signed)
CALLED PATIENT  TO INFORM THAT I HAD TO MOVE FU APPT. FOR 08-18-17 DUE TO PATIENT NEEDING A SCAN , I ARRANGED STAT LABS ON 08-22-17 @ 11:15 AM @ [place] AND HIS CT ON 08-22-17- ARRIVAL TIME - 12:15 PM @ [facility], PT. TO HAVE WATER ONLY -4 HRS. PRIOR TO TEST, NO ANSWER WILL CALL LATER

## 2017-08-18 ENCOUNTER — Ambulatory Visit
Admission: RE | Admit: 2017-08-18 | Discharge: 2017-08-18 | Disposition: A | Discharge status: Home / Self Care | Payer: Medicare HMO | Source: Ambulatory Visit | Attending: Urology | Admitting: Urology

## 2017-08-21 NOTE — Telephone Encounter (Signed)
Dr. Buelah Manis, This is an order we put in on 09/07/16 and apparently was never signed. It has been sitting in my in box ever since and IT says I should route it to you to sign and close.  Thank you,  Jerl Santos CMA, float pool ( Did referrals for you while Maudie Mercury was out)

## 2017-08-21 NOTE — Progress Notes (Addendum)
Evan [name].  [name] 84 yo man with clinical stage I Adenocarcinoma of the right lower lung radiation completed 02-22-16, review 08-22-17 CT chest w contrast, 4 month FU    Weight changes, if any: Wt Readings from Last 3 Encounters:  08/23/17 176 lb 3.2 oz (79.9 kg)  07/07/17 172 lb (78 kg)  06/28/17 171 lb 9.6 oz (77.8 kg)   Respiratory complaints, if any: SOB with exertion and in bed, Hemoptysis, if any: No Swallowing Problems/Pain/Difficulty swallowing:No problems Appetite :Good Pain:No c/o chest tightness took NTG last night with some relief from chest tightness has some today. 08-16-16 Saw Dr. Julien Nordmann the patient has been on observation for the last few months. BP (!) 184/82 (BP Location: Right Arm, Patient Position: Sitting, Cuff Size: Normal)   Pulse 66   Temp 97.6 F (36.4 C) (Oral)   Resp 20   Ht 5\' 6"  (1.676 m)   Wt 176 lb 3.2 oz (79.9 kg)   SpO2 98%   BMI 28.44 kg/m  Did not take blood pressure medication for today will take when he gets home per daughters.

## 2017-08-22 ENCOUNTER — Ambulatory Visit
Admission: RE | Admit: 2017-08-22 | Discharge: 2017-08-22 | Disposition: A | Discharge status: Home / Self Care | Payer: Medicare HMO | Source: Ambulatory Visit | Attending: Urology | Admitting: Urology

## 2017-08-22 ENCOUNTER — Ambulatory Visit (HOSPITAL_COMMUNITY)
Admission: RE | Admit: 2017-08-22 | Discharge: 2017-08-22 | Disposition: A | Discharge status: Home / Self Care | Payer: Medicare HMO | Source: Ambulatory Visit | Attending: Urology | Admitting: Urology

## 2017-08-22 ENCOUNTER — Other Ambulatory Visit: Payer: Self-pay

## 2017-08-22 DIAGNOSIS — Z923 Personal history of irradiation: Secondary | ICD-10-CM | POA: Diagnosis not present

## 2017-08-22 DIAGNOSIS — J701 Chronic and other pulmonary manifestations due to radiation: Secondary | ICD-10-CM | POA: Diagnosis not present

## 2017-08-22 DIAGNOSIS — C3431 Malignant neoplasm of lower lobe, right bronchus or lung: Secondary | ICD-10-CM | POA: Insufficient documentation

## 2017-08-22 DIAGNOSIS — R590 Localized enlarged lymph nodes: Secondary | ICD-10-CM | POA: Insufficient documentation

## 2017-08-22 DIAGNOSIS — C3491 Malignant neoplasm of unspecified part of right bronchus or lung: Secondary | ICD-10-CM

## 2017-08-22 DIAGNOSIS — Z79899 Other long term (current) drug therapy: Secondary | ICD-10-CM | POA: Diagnosis not present

## 2017-08-22 DIAGNOSIS — R0602 Shortness of breath: Secondary | ICD-10-CM | POA: Insufficient documentation

## 2017-08-22 DIAGNOSIS — R05 Cough: Secondary | ICD-10-CM | POA: Insufficient documentation

## 2017-08-22 DIAGNOSIS — C3490 Malignant neoplasm of unspecified part of unspecified bronchus or lung: Secondary | ICD-10-CM | POA: Diagnosis not present

## 2017-08-22 LAB — BUN & CREATININE (CHCC)
BUN: 25 mg/dL (ref 7–26)
Creatinine: 1.36 mg/dL — ABNORMAL HIGH (ref 0.70–1.30)
GFR, Est AFR Am: 52 mL/min — ABNORMAL LOW (ref >60)
GFR, Estimated: 45 mL/min — ABNORMAL LOW (ref >60)

## 2017-08-22 MED ORDER — IOPAMIDOL (ISOVUE-300) INJECTION 61%
75.0000 mL | Freq: Once | INTRAVENOUS | Status: AC | PRN
  Administered 2017-08-22: 60 mL via INTRAVENOUS

## 2017-08-22 MED ORDER — IOPAMIDOL (ISOVUE-300) INJECTION 61%
INTRAVENOUS | Status: AC
  Filled 2017-08-22: qty 100

## 2017-08-22 NOTE — Telephone Encounter (Signed)
Where is the order? Nothing was routed to me

## 2017-08-22 NOTE — Telephone Encounter (Signed)
I don't know Dr. Buelah Manis, this was 11 months ago. If you can't figure it out don't worry about it. I guess it will go away eventually  Thanks,  Evan [name]

## 2017-08-23 ENCOUNTER — Other Ambulatory Visit: Payer: Self-pay | Admitting: Family Medicine

## 2017-08-23 ENCOUNTER — Other Ambulatory Visit: Payer: Self-pay

## 2017-08-23 ENCOUNTER — Ambulatory Visit
Admission: RE | Admit: 2017-08-23 | Discharge: 2017-08-23 | Disposition: A | Discharge status: Home / Self Care | Payer: Medicare HMO | Source: Ambulatory Visit | Attending: Urology | Admitting: Urology

## 2017-08-23 ENCOUNTER — Encounter: Payer: Self-pay | Admitting: Urology

## 2017-08-23 VITALS — BP 184/82 | HR 66 | Temp 97.6°F | Resp 20 | Ht 66.0 in | Wt 176.2 lb

## 2017-08-23 DIAGNOSIS — Z79899 Other long term (current) drug therapy: Secondary | ICD-10-CM | POA: Diagnosis not present

## 2017-08-23 DIAGNOSIS — R0602 Shortness of breath: Secondary | ICD-10-CM | POA: Diagnosis not present

## 2017-08-23 DIAGNOSIS — R05 Cough: Secondary | ICD-10-CM | POA: Diagnosis not present

## 2017-08-23 DIAGNOSIS — Z85118 Personal history of other malignant neoplasm of bronchus and lung: Secondary | ICD-10-CM | POA: Diagnosis not present

## 2017-08-23 DIAGNOSIS — C3431 Malignant neoplasm of lower lobe, right bronchus or lung: Secondary | ICD-10-CM | POA: Diagnosis not present

## 2017-08-23 DIAGNOSIS — R590 Localized enlarged lymph nodes: Secondary | ICD-10-CM | POA: Diagnosis not present

## 2017-08-23 DIAGNOSIS — C3491 Malignant neoplasm of unspecified part of right bronchus or lung: Secondary | ICD-10-CM

## 2017-08-23 DIAGNOSIS — Z08 Encounter for follow-up examination after completed treatment for malignant neoplasm: Secondary | ICD-10-CM | POA: Diagnosis not present

## 2017-08-23 DIAGNOSIS — Z923 Personal history of irradiation: Secondary | ICD-10-CM | POA: Diagnosis not present

## 2017-08-23 NOTE — Progress Notes (Signed)
Radiation Oncology         [phone] ________________________________  Name: Evan [name] MRN: [id number]  Date: 08/23/2017  DOB: [birth year]  Post Treatment Note  CC: [name], MD  [name], *  Diagnosis:   84 yo man with clinical stage I Adenocarcinoma of the right lower lung     Interval Since Last Radiation: 1 year, 6 months 02/15/2016 to 02/22/2016:  The RLL target was treated to 50 Gy in 5 fractions of 10 Gy  Narrative:  The patient returns today for routine follow-up and to discuss results from his recent follow up Chest CT.   In summary, he was initially seen at the request of Dr. Julien Nordmann on 01/25/16 for a new diagnosis of early stage lung cancer. The patient had been seen by Dr. Vaughan Browner in pulmonology on 12/03/15 complaining of chest pain and worsening shortness of breath. A CT scan of the chest on 12/15/2015 revealed interval enlargement of 2.7 x 1.6 x 2.0 cm right lower lobe mass highly suspicious for primary bronchogenic carcinoma. There was also borderline enlargement 0.9 cm high right paratracheal lymph node that was nonspecific. A PET scan was performed on 12/22/2015 and it showed predominantly solid 2.9 x 2.6 cm posterior right lower lobe pulmonary nodule with peripheral groundglass density mildly hypermetabolic with maximum SUV of 3.9 which increased from 1.3 x 1.1 cm. There was hypermetabolic bilateral hilar and mediastinal adenopathy highly nonspecific given the suspected underlying interstitial lung disease and could be reactive or metastatic. There was also nonspecific hypermetabolic right level II lymph nodes favoring a reactive node. On 12/24/2015 the patient underwent CT-guided core biopsy of the right lower lobe lung nodule by interventional radiology. The final pathology revealed adenocarcinoma.  MRI of the brain performed on 01/15/2016 showed no evidence for metastatic disease to the brain.  He elected to proceed with SBRT to the RLL mass for  definitive treatment and received 5 fractions to a total dose of 50Gy between 02/15/2016 to 02/22/2016.  He tolerated treatment well and had no significant adverse effects. His initial post-treatment CT Chest demonstrated evolving postradiation changes as well as an interval increase in size of the treated RLL nodule and hilar nodes but were felt likely related to inflammatory changes associated with recent SBRT.  His subsequent follow up Chest CTs for disease surveillance have not shown evidence for disease progression.  He continues in follow up with Dr. Vaughan Browner in pulmonology for underlying COPD/emphysema.  He had a recent follow-up chest CT on 08/22/17 which shows interval progression of the confluent opacity in the region of right lower lobe, presumably radiation fibrosis and stable scattered mediastinal lymph nodes.  He presents today with family to review these findings.                       On review of systems, the patient states that he is doing well overall. He continues with chronic shortness of breath which is exacerbated with exertion and lying flat as well as a chronic productive cough with whitish sputum.  He denies hemoptysis, chest pain, fever, chills or night sweats. He reports a good appetite and has been maintaining his weight.  He denies headaches, changes in visual or auditory acuity, difficulty with speech, imbalance, tremor or seizure activity.  ALLERGIES:  is allergic to feldene [piroxicam]; statins; doxycycline; latex; tequin; and valium.  Meds: Current Outpatient Medications  Medication Sig Dispense Refill  . acetaminophen (TYLENOL) 500 MG tablet Take 500  mg by mouth every 6 (six) hours as needed for mild pain.    Marland Kitchen apixaban (ELIQUIS) 5 MG TABS tablet Take 1 tablet (5 mg total) by mouth 2 (two) times daily. 60 tablet 11  . Cholecalciferol 2000 units TABS Take 1 tablet (2,000 Units total) by mouth daily. 30 each 11  . Cyanocobalamin (VITAMIN B-12 PO) Take 1 tablet by mouth  daily.    Marland Kitchen FLUoxetine (PROZAC) 20 MG capsule TAKE 20 mg  BY MOUTH DAILY 90 capsule 1  . furosemide (LASIX) 40 MG tablet Take 1 tablet (40 mg total) by mouth daily as needed (swelling). 30 tablet 0  . leflunomide (ARAVA) 10 MG tablet Take 10 mg by mouth daily.    Marland Kitchen Lifitegrast (XIIDRA) 5 % SOLN Place 1-2 drops into both eyes daily.    Marland Kitchen LORazepam (ATIVAN) 1 MG tablet Take 1 tablet (1 mg total) by mouth every 8 (eight) hours as needed for anxiety. 90 tablet 0  . metoprolol succinate (TOPROL-XL) 25 MG 24 hr tablet Take 1 tablet (25 mg total) by mouth daily. 30 tablet 1  . nitroGLYCERIN (NITROSTAT) 0.4 MG SL tablet Place 1 tablet (0.4 mg total) under the tongue every 5 (five) minutes as needed for chest pain. 25 tablet 2  . pantoprazole (PROTONIX) 40 MG tablet Take one (1) tablet (40 mg) by mouth daily. 90 tablet 3  . rosuvastatin (CRESTOR) 5 MG tablet TAKE 5 mg  EVERY DAY 90 tablet 3  . tamsulosin (FLOMAX) 0.4 MG CAPS capsule TAKE 0.4 gm by mouth EVERY DAY 90 capsule 3  . amoxicillin (AMOXIL) 500 MG capsule Take 500 mg by mouth every 8 (eight) hours. For 7 days  0  . chlorhexidine (PERIDEX) 0.12 % solution SWISH AND SPIT 10ML BY MOUTH TWICE A DAY  0  . colchicine 0.6 MG tablet Take 1 tablet (0.6 mg total) by mouth daily. (Patient not taking: Reported on 08/23/2017) 30 tablet 11  . ipratropium-albuterol (DUONEB) 0.5-2.5 (3) MG/3ML SOLN Take 3 mLs by nebulization every 4 (four) hours as needed (or Chest Pain / Tightness). (Patient not taking: Reported on 08/23/2017) 360 mL 3  . Spacer/Aero-Holding Dorise Bullion by Does not apply route.     No current facility-administered medications for this encounter.     Physical Findings:  height is 5\' 6"  (1.676 m) and weight is 176 lb 3.2 oz (79.9 kg). His oral temperature is 97.6 F (36.4 C). His blood pressure is 184/82 (abnormal) and his pulse is 66. His respiration is 20 and oxygen saturation is 98%.  Pain Assessment Pain Score: 0-No pain/10 In general  this is a well appearing Caucasian male in no acute distress. He's alert and oriented x4 and appropriate throughout the examination. Cardiopulmonary assessment is negative for acute distress and he exhibits normal effort.   Lab Findings: Lab Results  Component Value Date   WBC 7.2 06/27/2017   HGB 12.3 (L) 06/27/2017   HCT 37.4 (L) 06/27/2017   MCV 92.3 06/27/2017   PLT 84 (L) 06/27/2017     Radiographic Findings: Ct Chest W Contrast  Result Date: 08/22/2017 CLINICAL DATA:  Lung cancer. EXAM: CT CHEST WITH CONTRAST TECHNIQUE: Multidetector CT imaging of the chest was performed during intravenous contrast administration. CONTRAST:  18mL ISOVUE-300 IOPAMIDOL (ISOVUE-300) INJECTION 61% COMPARISON:  04/20/2017 FINDINGS: Cardiovascular: Heart is enlarged. Coronary artery calcification is evident. Atherosclerotic calcification is noted in the wall of the thoracic aorta. Prominence of the main pulmonary arteries suggests underlying pulmonary arterial hypertension. Mediastinum/Nodes: High  right paratracheal lymph node stable at 10 mm short axis. Stable 10 mm short axis subcarinal lymph node. No hilar lymphadenopathy. The esophagus has normal imaging features. There is no axillary lymphadenopathy. Lungs/Pleura: Dependent mucus/debris noted in the trachea. Subpleural reticulation with a basilar predominance is similar to prior study. Right lower lobe post radiation changes become more confluent in the interval. No evidence for pulmonary edema or pleural effusion. Upper Abdomen: Stable. Musculoskeletal: Bone windows reveal no worrisome lytic or sclerotic osseous lesions. IMPRESSION: 1. Interval progression of confluent opacity in the region of right lower lobe radiation fibrosis. Patient is 1.5 years from last radiation treatment. Close follow-up recommended as recurrent disease now a concern. Electronically Signed   By: Misty Stanley M.D.   On: 08/22/2017 15:41    Impression/Plan: 62. 84 yo man with  clinical stage I Adenocarcinoma of the right lower lung. We reviewed his most recent chest CT results which revealed overall disease stability with no new concerning nodules or definite evidence of disease progression. There is interval progression of the RLL opacity, presumed to be radiation fibrosis/underlying COPD but given the fact that he is now 1.5 years out from completion of XRT, this will need to be followed closely as the potential for recurrent disease is now a concern.  We will continue to monitor him closely with a repeat chest CT in approximately 4 months. He will follow up in the office thereafter to review results and any further recommendations. He will continue to follow-up with Dr. Vaughan Browner in pulmonology for his underlying interstitial lung disease contributing to his chronic shortness of breath and cough.   I have advised his wife to call [facility] pulmonology to coordinate his follow up visit. We will see him back in the office after his next imaging to review those results and recommendations. He knows to call with any questions or concerns in the interim.       Nicholos Johns, PA-C

## 2017-08-23 NOTE — Telephone Encounter (Signed)
Ok to refill??  Last office visit 05/04/2017.  Last refill 07/10/2017.

## 2017-09-04 DIAGNOSIS — J449 Chronic obstructive pulmonary disease, unspecified: Secondary | ICD-10-CM | POA: Diagnosis not present

## 2017-09-04 DIAGNOSIS — R0602 Shortness of breath: Secondary | ICD-10-CM | POA: Diagnosis not present

## 2017-09-04 DIAGNOSIS — J4520 Mild intermittent asthma, uncomplicated: Secondary | ICD-10-CM | POA: Diagnosis not present

## 2017-09-04 DIAGNOSIS — R296 Repeated falls: Secondary | ICD-10-CM | POA: Diagnosis not present

## 2017-09-04 DIAGNOSIS — R269 Unspecified abnormalities of gait and mobility: Secondary | ICD-10-CM | POA: Diagnosis not present

## 2017-09-09 ENCOUNTER — Other Ambulatory Visit: Payer: Self-pay | Admitting: Interventional Cardiology

## 2017-09-25 ENCOUNTER — Other Ambulatory Visit: Payer: Self-pay | Admitting: Family Medicine

## 2017-09-25 NOTE — Telephone Encounter (Signed)
Requesting refill   Lorazepam   LOV: 05/04/17  LRF: 08/24/17

## 2017-10-04 ENCOUNTER — Encounter: Payer: Self-pay | Admitting: Physician Assistant

## 2017-10-04 ENCOUNTER — Ambulatory Visit: Payer: Medicare HMO | Admitting: Physician Assistant

## 2017-10-04 VITALS — BP 136/80 | HR 68 | Temp 97.8°F | Resp 20 | Ht 66.0 in | Wt 175.8 lb

## 2017-10-04 DIAGNOSIS — R296 Repeated falls: Secondary | ICD-10-CM | POA: Diagnosis not present

## 2017-10-04 DIAGNOSIS — R0602 Shortness of breath: Secondary | ICD-10-CM | POA: Diagnosis not present

## 2017-10-04 DIAGNOSIS — J4520 Mild intermittent asthma, uncomplicated: Secondary | ICD-10-CM | POA: Diagnosis not present

## 2017-10-04 DIAGNOSIS — J449 Chronic obstructive pulmonary disease, unspecified: Secondary | ICD-10-CM | POA: Diagnosis not present

## 2017-10-04 DIAGNOSIS — L309 Dermatitis, unspecified: Secondary | ICD-10-CM | POA: Diagnosis not present

## 2017-10-04 DIAGNOSIS — R269 Unspecified abnormalities of gait and mobility: Secondary | ICD-10-CM | POA: Diagnosis not present

## 2017-10-04 MED ORDER — TRIAMCINOLONE ACETONIDE 0.025 % EX CREA: 1.0000 "application " | TOPICAL_CREAM | Freq: Two times a day (BID) | CUTANEOUS | 0 refills | Status: DC

## 2017-10-04 NOTE — Progress Notes (Signed)
Patient ID: Evan [name] MRN: [id number], DOB: [birth year], 84 y.o. Date of Encounter: 10/04/2017, 4:13 PM    Chief Complaint:  Chief Complaint  Patient presents with  . Rash    rash on chest started today      HPI: 84 y.o. year old male presents with above.   He reports that he just developed this itchy rash on his chest.  Does not know what is causing it.  Not aware of coming in contact with anything that would be causing an allergic/contact dermatitis.  No other areas of itching or rash.     Home Meds:   Outpatient Medications Prior to Visit  Medication Sig Dispense Refill  . acetaminophen (TYLENOL) 500 MG tablet Take 500 mg by mouth every 6 (six) hours as needed for mild pain.    Marland Kitchen amoxicillin (AMOXIL) 500 MG capsule Take 500 mg by mouth every 8 (eight) hours. For 7 days  0  . apixaban (ELIQUIS) 5 MG TABS tablet Take 1 tablet (5 mg total) by mouth 2 (two) times daily. 60 tablet 11  . chlorhexidine (PERIDEX) 0.12 % solution SWISH AND SPIT 10ML BY MOUTH TWICE A DAY  0  . Cholecalciferol 2000 units TABS Take 1 tablet (2,000 Units total) by mouth daily. 30 each 11  . colchicine 0.6 MG tablet Take 1 tablet (0.6 mg total) by mouth daily. 30 tablet 11  . Cyanocobalamin (VITAMIN B-12 PO) Take 1 tablet by mouth daily.    Marland Kitchen FLUoxetine (PROZAC) 20 MG capsule TAKE 20 mg  BY MOUTH DAILY 90 capsule 1  . furosemide (LASIX) 40 MG tablet Take 1 tablet (40 mg total) by mouth daily as needed (swelling). 30 tablet 0  . ipratropium-albuterol (DUONEB) 0.5-2.5 (3) MG/3ML SOLN Take 3 mLs by nebulization every 4 (four) hours as needed (or Chest Pain / Tightness). 360 mL 3  . leflunomide (ARAVA) 10 MG tablet Take 10 mg by mouth daily.    Marland Kitchen Lifitegrast (XIIDRA) 5 % SOLN Place 1-2 drops into both eyes daily.    Marland Kitchen LORazepam (ATIVAN) 1 MG tablet TAKE ONE TABLET BY MOUTH EVERY EIGHT HOURS AS NEEDED FOR ANXIETY 90 tablet 0  . metoprolol succinate (TOPROL-XL) 25 MG 24 hr tablet TAKE ONE (1) TABLET BY MOUTH  EVERY DAY 30 tablet 10  . nitroGLYCERIN (NITROSTAT) 0.4 MG SL tablet Place 1 tablet (0.4 mg total) under the tongue every 5 (five) minutes as needed for chest pain. 25 tablet 2  . pantoprazole (PROTONIX) 40 MG tablet Take one (1) tablet (40 mg) by mouth daily. 90 tablet 3  . rosuvastatin (CRESTOR) 5 MG tablet TAKE 5 mg  EVERY DAY 90 tablet 3  . Spacer/Aero-Holding Dorise Bullion by Does not apply route.    . tamsulosin (FLOMAX) 0.4 MG CAPS capsule TAKE 0.4 gm by mouth EVERY DAY 90 capsule 3   No facility-administered medications prior to visit.     Allergies:  Allergies  Allergen Reactions  . Feldene [Piroxicam] Other (See Comments)    REACTION: Blisters  . Statins Other (See Comments)    Myalgias  . Doxycycline Other (See Comments)    REACTION:  unknown  . Latex Rash  . Tequin Anxiety  . Valium Other (See Comments)    REACTION:  unknown      Review of Systems: See HPI for pertinent ROS. All other ROS negative.    Physical Exam: Blood pressure 136/80, pulse 68, temperature 97.8 F (36.6 C), temperature source Oral, resp. rate 20,  height 5\' 6"  (1.676 m), weight 79.7 kg (175 lb 12.8 oz), SpO2 95 %., Body mass index is 28.37 kg/m. General:  WNWD WM. Appears in no acute distress. Neck: Supple. No thyromegaly. No lymphadenopathy. Lungs: Clear bilaterally to auscultation without wheezes, rales, or rhonchi. Breathing is unlabored. Heart: Regular rhythm. No murmurs, rubs, or gallops. Msk:  Strength and tone normal for age. Skin: Splotchy areas of erythema across his mid chest.  No other areas of rash. Neuro: Alert and oriented X 3. Moves all extremities spontaneously. Gait is normal. CNII-XII grossly in tact. Psych:  Responds to questions appropriately with a normal affect.     ASSESSMENT AND PLAN:  84 y.o. year old male with  1. Dermatitis He can apply the triamcinolone cream sparingly to the area.  Follow-up as needed. - triamcinolone (KENALOG) 0.025 % cream; Apply 1  application topically 2 (two) times daily.  Dispense: 30 g; Refill: 0   Signed, [address] [place], [place], [facility] 10/04/2017 4:13 PM

## 2017-10-16 ENCOUNTER — Encounter: Payer: Self-pay | Admitting: Adult Health

## 2017-10-16 ENCOUNTER — Ambulatory Visit: Payer: Medicare HMO | Admitting: Adult Health

## 2017-10-16 DIAGNOSIS — R0600 Dyspnea, unspecified: Secondary | ICD-10-CM | POA: Diagnosis not present

## 2017-10-16 DIAGNOSIS — C3491 Malignant neoplasm of unspecified part of right bronchus or lung: Secondary | ICD-10-CM | POA: Diagnosis not present

## 2017-10-16 NOTE — Progress Notes (Signed)
@Patient  ID: Evan [name], male    DOB: 07/01/1927, 84 y.o.   MRN: [id number]  Chief Complaint  Patient presents with  . 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 , CAD s/p CABG. , Pericardiditis 12/2016   TEST PFT 01/05/16:FVC 3.26 L (111%) FEV1 2.61 L (131%) FEV1/FVC 0.80 FEF 25-75 2.60 L (214%) negative bronchodilator response TLC 5.47 L (90%) RV 86% ERV 401% DLCO corrected 46% 2018Spirometry today show some mild restriction with FVC at 70%, FEV1 is 87%, ratio 85%  WALK TEST 10/11/16: Walked 1lap / Baseline Sat 100% on RA / Nadir Sat 100% on RA (stopped due to feeling his legs were weak)  IMAGING CT CHEST W/ CONTRAST 10/04/16 (personally reviewed by me):Trace left pleural effusion. Pleural thickening on the right noted. No pathologic mediastinal adenopathy. No pericardial effusion. Improvement in right basilar density compared with CT imaging performed in February. There is also decreased subpleural patchy groundglass opacities as well. No new or developing areas of opacification or groundglass. Questionable subpleural reticulation, primarily on the right. No obvious honeycombing or bronchiectasis appreciated.  PET CT 12/22/15 (per radiologist):  IMPRESSION: 1. Hypermetabolic (max SUV 3.9) posterior right lower lobe predominantly solid 2.9 cm pulmonary nodule, which is increased in size back to 2013, most consistent with primary bronchogenic adenocarcinoma. 2. Patchy subpleural reticulation throughout both lungs, basilar predominant, suspicious for interstitial lung disease, not well characterized on this study. Consider correlation with high-resolution chest CT in 6-12 months to assess temporal pattern stability, as clinically warranted. 3. Hypermetabolic bilateral hilar and mediastinal  lymphadenopathy is highly nonspecific given the suspected underlying interstitial lung disease, and could be reactive or metastatic. 4. Nonspecific hypermetabolic right level 2 neck lymph node, favor reactive node, cannot exclude metastasis. 5. No hypermetabolic osseous or abdominopelvic metastatic disease.  6. Mild hypermetabolism associated with subcutaneous fat stranding in the lateral right hip soft tissues, probably benign, correlate with any history of recent trauma or procedure in this location. 7. Relatively symmetric hypermetabolism in the bilateral glenohumeral joints with associated osteoarthritis, favor degenerative uptake. 8. Additional findings include mild cardiomegaly, aortic atherosclerosis, coronary atherosclerosis status post CABG and moderate sigmoid diverticulosis.  CARDIAC TTE (05/23/16):The normal in size with moderate concentric hypertrophy. EF 24-23%NTIR normal diastolic function. LA moderately to severely dilated with RA normal in size. RV normal in size and function. Mild aortic stenosis with moderately calcified leaflets. Mild mitral regurgitation. Trivial pulmonic regurgitation. Mild tricuspid regurgitation.  PATHOLOGY CT-GUIDED BIOPSY RIGHT LOWER LOBE MASS (12/30/15):Adenocarcinoma  10/16/2017 Follow up : Dyspnea , Lung Cancer  Patient presents for a 44-month follow-up.  Last visit patient was having chest pain and shortness of breath.  He was admitted to the hospital for atypical chest pain.  He underwent a left heart cath that showed stable grafts from previous CABG. was recommended continue on medical therapy. Patient says overall breathing is doing okay he still continues to get short of breath with activity.  Does have some occasional shortness of breath during the nighttime.  When he takes his DuoNeb nebulizer this seems to help.  But he does not take it on a consistent basis.  Patient has been diagnosed with previous adenocarcinoma of the right lung.  Status  post radiation therapy.  Is followed by radiation oncology.  Last CT chest  in May showed interval progression of confluent opacity at the right lower lobe radiation fibrosis.  He has been set up for a follow-up CT chest in 4 months.  Denies any increased cough.  No increased congestion.  Does feel that he is weak and energy level has dropped down.  Patient lives with his daughter.  Says that he tries to be active.  Goes out to eat.  And does walking each day.   Allergies  Allergen Reactions  . Feldene [Piroxicam] Other (See Comments)    REACTION: Blisters  . Statins Other (See Comments)    Myalgias  . Doxycycline Other (See Comments)    REACTION:  unknown  . Latex Rash  . Tequin Anxiety  . Valium Other (See Comments)    REACTION:  unknown    Immunization History  Administered Date(s) Administered  . Influenza Whole 01/26/2007, 01/10/2008, 12/30/2009  . Influenza, High Dose Seasonal PF 12/23/2016  . Influenza,inj,Quad PF,6+ Mos 12/18/2012, 01/16/2015, 12/29/2015  . Pneumococcal Conjugate-13 06/24/2014  . Pneumococcal Polysaccharide-23 01/25/2000  . Td 12/27/2000, 03/09/2010  . Tdap 03/09/2010    Past Medical History:  Diagnosis Date  . Adenocarcinoma of right lung ([place]) 01/06/2016  . Anginal chest pain at rest [facility])    Chronic non, controlled on Lorazepam  . Anxiety   . Arthritis    "all over"   . BPH (benign prostatic hyperplasia)   . CAD (coronary artery disease)   . Chronic lower back pain   . Complication of anesthesia    "problems making water afterwards"  . Depression   . DJD (degenerative joint disease)   . Esophageal ulcer without bleeding    bid ppi indefinitely  . Family history of adverse reaction to anesthesia    "children w/PONV"  . GERD (gastroesophageal reflux disease)   . Headache    "couple/week maybe" (09/04/2015)  . Hemochromatosis    Possible, elevated iron stores, hook-like osteophytes on hand films, normal LFTs  . Hepatitis    "yellow jaundice  as a baby"  . History of ASCVD    MULTIVESSEL  . Hyperlipidemia   . Hypertension   . Myocardial infarction ([place]) 1999  . Osteoarthritis   . Pneumonia    "several times; got a little now" (09/04/2015)  . Rheumatoid arthritis ([place])   . Subdural hematoma ([facility])    small after fall 09/2016-plavix held, neurosurgery consulted.  Asymptomatic.      Tobacco History: Social History   Tobacco Use  Smoking Status Never Smoker  Smokeless Tobacco Former Systems developer  . Types: Chew  Tobacco Comment   "quit chewing in the 1960s"   Counseling given: Not Answered Comment: "quit chewing in the 1960s"   Outpatient Medications Prior to Visit  Medication Sig Dispense Refill  . acetaminophen (TYLENOL) 500 MG tablet Take 500 mg by mouth every 6 (six) hours as needed for mild pain.    Marland Kitchen apixaban (ELIQUIS) 5 MG TABS tablet Take 1 tablet (5 mg total) by mouth 2 (two) times daily. 60 tablet 11  . chlorhexidine (PERIDEX) 0.12 % solution SWISH AND SPIT 10ML BY MOUTH TWICE A DAY  0  . Cholecalciferol 2000 units TABS Take 1 tablet (2,000 Units total) by mouth daily. 30 each 11  . Cyanocobalamin (VITAMIN B-12 PO) Take 1 tablet by mouth daily.    Marland Kitchen FLUoxetine (PROZAC) 20 MG capsule TAKE 20 mg  BY MOUTH DAILY 90 capsule 1  . ipratropium-albuterol (DUONEB) 0.5-2.5 (3) MG/3ML SOLN Take 3 mLs by nebulization every 4 (  four) hours as needed (or Chest Pain / Tightness). 360 mL 3  . leflunomide (ARAVA) 10 MG tablet Take 10 mg by mouth daily.    Marland Kitchen Lifitegrast (XIIDRA) 5 % SOLN Place 1-2 drops into both eyes daily.    Marland Kitchen LORazepam (ATIVAN) 1 MG tablet TAKE ONE TABLET BY MOUTH EVERY EIGHT HOURS AS NEEDED FOR ANXIETY 90 tablet 0  . metoprolol succinate (TOPROL-XL) 25 MG 24 hr tablet TAKE ONE (1) TABLET BY MOUTH EVERY DAY 30 tablet 10  . nitroGLYCERIN (NITROSTAT) 0.4 MG SL tablet Place 1 tablet (0.4 mg total) under the tongue every 5 (five) minutes as needed for chest pain. 25 tablet 2  . pantoprazole (PROTONIX) 40 MG tablet Take  one (1) tablet (40 mg) by mouth daily. 90 tablet 3  . rosuvastatin (CRESTOR) 5 MG tablet TAKE 5 mg  EVERY DAY 90 tablet 3  . Spacer/Aero-Holding Dorise Bullion by Does not apply route.    . tamsulosin (FLOMAX) 0.4 MG CAPS capsule TAKE 0.4 gm by mouth EVERY DAY 90 capsule 3  . triamcinolone (KENALOG) 0.025 % cream Apply 1 application topically 2 (two) times daily. 30 g 0  . colchicine 0.6 MG tablet Take 1 tablet (0.6 mg total) by mouth daily. (Patient not taking: Reported on 10/16/2017) 30 tablet 11  . furosemide (LASIX) 40 MG tablet Take 1 tablet (40 mg total) by mouth daily as needed (swelling). 30 tablet 0  . amoxicillin (AMOXIL) 500 MG capsule Take 500 mg by mouth every 8 (eight) hours. For 7 days  0   No facility-administered medications prior to visit.      Review of Systems  Constitutional:   No  weight loss, night sweats,  Fevers, chills,  +fatigue, or  lassitude.  HEENT:   No headaches,  Difficulty swallowing,  Tooth/dental problems, or  Sore throat,                No sneezing, itching, ear ache, nasal congestion, post nasal drip,   CV:  No chest pain,  Orthopnea, PND, swelling in lower extremities, anasarca, dizziness, palpitations, syncope.   GI  No heartburn, indigestion, abdominal pain, nausea, vomiting, diarrhea, change in bowel habits, loss of appetite, bloody stools.   Resp:    No chest wall deformity  Skin: no rash or lesions.  GU: no dysuria, change in color of urine, no urgency or frequency.  No flank pain, no hematuria   MS:  No joint pain or swelling.  No decreased range of motion.  No back pain.    Physical Exam  BP 130/82 (BP Location: Right Arm, Cuff Size: Normal)   Pulse 65   Ht 5\' 5"  (1.651 m)   Wt 176 lb 12.8 oz (80.2 kg)   SpO2 96%   BMI 29.42 kg/m   GEN: A/Ox3; pleasant , NAD, elderly  HEENT:  /AT,  EACs-clear, TMs-wnl, NOSE-clear, THROAT-clear, no lesions, no postnasal drip or exudate noted.   NECK:  Supple w/ fair ROM; no JVD; normal  carotid impulses w/o bruits; no thyromegaly or nodules palpated; no lymphadenopathy.    RESP  Clear  P & A; w/o, wheezes/ rales/ or rhonchi. no accessory muscle use, no dullness to percussion  CARD:  RRR, no m/r/g, tr peripheral edema, pulses intact, no cyanosis or clubbing.  GI:   Soft & nt; nml bowel sounds; no organomegaly or masses detected.   Musco: Warm bil, no deformities or joint swelling noted.   Neuro: alert, no focal deficits noted.  Skin: Warm, no lesions or rashes    Lab Results:  CBC    Component Value Date/Time   WBC 7.2 06/27/2017 0445   RBC 4.05 (L) 06/27/2017 0445   HGB 12.3 (L) 06/27/2017 0445   HGB 13.4 08/16/2016 1155   HCT 37.4 (L) 06/27/2017 0445   HCT 39.8 08/16/2016 1155   PLT 84 (L) 06/27/2017 0445   PLT 81 (L) 08/16/2016 1155   MCV 92.3 06/27/2017 0445   MCV 92.3 08/16/2016 1155   MCH 30.4 06/27/2017 0445   MCHC 32.9 06/27/2017 0445   RDW 14.7 06/27/2017 0445   RDW 13.3 08/16/2016 1155   LYMPHSABS 792 (L) 01/17/2017 1115   LYMPHSABS 1.3 08/16/2016 1155   MONOABS 1,197 (H) 10/03/2016 0910   MONOABS 1.2 (H) 08/16/2016 1155   EOSABS 149 01/17/2017 1115   EOSABS 0.1 08/16/2016 1155   BASOSABS 28 01/17/2017 1115   BASOSABS 0.0 08/16/2016 1155    BMET    Component Value Date/Time   NA 138 06/27/2017 0445   NA 143 08/16/2016 1155   K 4.1 06/27/2017 0445   K 4.3 08/16/2016 1155   CL 109 06/27/2017 0445   CO2 21 (L) 06/27/2017 0445   CO2 24 08/16/2016 1155   GLUCOSE 96 06/27/2017 0445   GLUCOSE 88 08/16/2016 1155   GLUCOSE 120 (H) 04/20/2015 1600   BUN 25 08/22/2017 1124   BUN 14.6 08/16/2016 1155   CREATININE 1.36 (H) 08/22/2017 1124   CREATININE 1.19 (H) 01/17/2017 1115   CREATININE 1.2 08/16/2016 1155   CALCIUM 8.5 (L) 06/27/2017 0445   CALCIUM 9.0 08/16/2016 1155   GFRNONAA 45 (L) 08/22/2017 1124   GFRNONAA 55 (L) 01/17/2017 1115   GFRAA 52 (L) 08/22/2017 1124   GFRAA 63 01/17/2017 1115    BNP    Component Value  Date/Time   BNP 454.7 (H) 05/11/2017 0352    ProBNP    Component Value Date/Time   PROBNP 666.0 (H) 06/21/2017 1617    Imaging: No results found.   Assessment & Plan:   Dyspnea Dyspnea suspect is multifactorial.  She is a never smoker does carry a diagnosis of COPD however previous PFTs have not showed any airflow obstruction.  Could have a component of asthma however has no increased cough or wheezing.  Does seem to have some improvement in symptoms with DuoNeb may use this twice a day to see if it has any clinical benefit.  Most recent CT chest does show slight progression along the right lower lobe radiation fibrosis.  But does not have any active symptoms of radiation pneumonitis.  This could be causing some more restrictive lung disease.  For now we will continue to monitor.  Use DuoNeb twice daily.  Have suggested he discuss with his primary care provider regarding if physical therapy might be of benefit as he appears to have some general deconditioning.  Adenocarcinoma of right lung ([place]) Status post radiation.  Patient is continue with surveillance CT chest.  He does have a CT ordered in 4 months by radiation oncology as had seen some progression along the right lower lobe radiation of fibrosis site.     Rexene Edison, NP 10/16/2017

## 2017-10-16 NOTE — Assessment & Plan Note (Signed)
Status post radiation.  Patient is continue with surveillance CT chest.  He does have a CT ordered in 4 months by radiation oncology as had seen some progression along the right lower lobe radiation of fibrosis site.

## 2017-10-16 NOTE — Assessment & Plan Note (Signed)
Dyspnea suspect is multifactorial.  She is a never smoker does carry a diagnosis of COPD however previous PFTs have not showed any airflow obstruction.  Could have a component of asthma however has no increased cough or wheezing.  Does seem to have some improvement in symptoms with DuoNeb may use this twice a day to see if it has any clinical benefit.  Most recent CT chest does show slight progression along the right lower lobe radiation fibrosis.  But does not have any active symptoms of radiation pneumonitis.  This could be causing some more restrictive lung disease.  For now we will continue to monitor.  Use DuoNeb twice daily.  Have suggested he discuss with his primary care provider regarding if physical therapy might be of benefit as he appears to have some general deconditioning.

## 2017-10-16 NOTE — Patient Instructions (Signed)
May use Duoneb Twice daily  .  Mucinex DM twice daily as needed for cough and congestion Follow-up with Dr. Lake Bells in 4-6 months and As needed   Please contact office for sooner follow up if symptoms do not improve or worsen or seek emergency care ]

## 2017-10-17 ENCOUNTER — Other Ambulatory Visit: Payer: Self-pay | Admitting: Family Medicine

## 2017-10-17 NOTE — Progress Notes (Signed)
Reviewed, agreed

## 2017-10-26 ENCOUNTER — Other Ambulatory Visit: Payer: Self-pay

## 2017-10-26 ENCOUNTER — Ambulatory Visit (INDEPENDENT_AMBULATORY_CARE_PROVIDER_SITE_OTHER): Payer: Medicare HMO | Admitting: Family Medicine

## 2017-10-26 ENCOUNTER — Encounter: Payer: Self-pay | Admitting: Family Medicine

## 2017-10-26 ENCOUNTER — Other Ambulatory Visit: Payer: Self-pay | Admitting: Physician Assistant

## 2017-10-26 VITALS — BP 128/68 | HR 72 | Temp 98.7°F | Resp 14 | Ht 65.0 in | Wt 167.0 lb

## 2017-10-26 DIAGNOSIS — R21 Rash and other nonspecific skin eruption: Secondary | ICD-10-CM

## 2017-10-26 MED ORDER — FLUCONAZOLE 100 MG PO TABS: 100.0000 mg | ORAL_TABLET | ORAL | 0 refills | Status: DC

## 2017-10-26 NOTE — Patient Instructions (Addendum)
Looks like a skin yeast infection.  Continue to treat with the over the counter miconazole (can do the powder spray or also get cream).  Apply two times a day until it is gone and then continue to apply for one more week to prevent reoccurrence.   Only take the one time dose of diflucan if the rash is getting worse.  If you take diflucan and the rash does not improve in the next week you need to be seen again in the office.

## 2017-10-26 NOTE — Progress Notes (Signed)
Patient ID: Evan [name], male    DOB: 08/05/1927, 84 y.o.   MRN: [id number]  PCP: [name], MD  Chief Complaint  Patient presents with  . Rash    x2 days- itchy rashy area to L side of groin- has been using jock itch spray powder    Subjective:   Evan [name] is a 84 y.o. male, presents to clinic with CC of one week of red itchy rash to left groin and skin folds.  Was worsening in his skin fold and getting larger over several days, but then they started using miconazole jock itch spray for the past 1-2 days and the redness and itching is getting much better.  Patient has been working outside a lot and has been very hot and sweaty.  He has no history of plant exposure, medication changes.  No diabetes.  History of yeast infection/oral candidiasis, most recently as 5 months ago. Patient also has history of lung cancer s/p SBRT (12/2015), CAD s/p CABG, on Arava (immune modulator) for RA. No one else at home with similar rash, no plant or insect exposure, no recent medication changes, no new soaps, detergents.  Patient Active Problem List   Diagnosis Date Noted  . Non-ST elevation MI (NSTEMI) ([place]) 06/27/2017  . Diastolic CHF ([place]) 09/38/1829  . Chest pain due to CAD 06/26/2017  . Dyspnea 06/21/2017  . Acute bronchitis 05/15/2017  . Oral candidiasis 05/15/2017  . Acute pericarditis   . Chest pain at rest 12/20/2016  . Angina pectoris ([place]) 12/20/2016  . Radiation pneumonitis ([place]) 11/09/2016  . Pleurisy 10/11/2016  . Chest wall pain 10/11/2016  . Atrial fibrillation ([place]) 05/21/2016  . Adenocarcinoma of right lung ([place]) 01/06/2016  . GAD (generalized anxiety disorder) 11/20/2015  . Essential hypertension   . Cellulitis 09/04/2015  . Cellulitis of left axilla   . Esophageal ulcer without bleeding   . Bilateral lower extremity edema 04/28/2014  . BPH (benign prostatic hyperplasia) 10/08/2012  . Anxiety   . EKG abnormalities 09/05/2012  . Tremor 09/05/2012  . Chronic  fatigue 09/05/2012  . Arthritis   . Asthma, chronic   . Reflux esophagitis   . S/P CABG (coronary artery bypass graft)   . GERD (gastroesophageal reflux disease)   . Hyperlipidemia   . Hypertension   . UNSPECIFIED PERIPHERAL VASCULAR DISEASE 06/16/2009     Prior to Admission medications   Medication Sig Start Date End Date Taking? Authorizing Provider  acetaminophen (TYLENOL) 500 MG tablet Take 500 mg by mouth every 6 (six) hours as needed for mild pain.   Yes [provider]  apixaban (ELIQUIS) 5 MG TABS tablet Take 1 tablet (5 mg total) by mouth 2 (two) times daily. 01/31/17  Yes Jettie Booze, MD  chlorhexidine (PERIDEX) 0.12 % solution SWISH AND SPIT 10ML BY MOUTH TWICE A DAY 07/03/17  Yes [provider]  Cholecalciferol 2000 units TABS Take 1 tablet (2,000 Units total) by mouth daily. 02/04/16  Yes [name], MD  FLUoxetine (PROZAC) 20 MG capsule TAKE 20 mg  BY MOUTH DAILY 12/23/16  Yes Kilroy, Luke K, PA-C  furosemide (LASIX) 40 MG tablet Take 1 tablet (40 mg total) by mouth daily as needed (swelling). 07/07/17  Yes Jettie Booze, MD  ipratropium-albuterol (DUONEB) 0.5-2.5 (3) MG/3ML SOLN Take 3 mLs by nebulization every 4 (four) hours as needed (or Chest Pain / Tightness). 12/23/16  Yes Kilroy, Luke K, PA-C  leflunomide (ARAVA) 10 MG tablet Take  10 mg by mouth daily. 06/30/17  Yes [provider]  Lifitegrast Shirley Friar) 5 % SOLN Place 1-2 drops into both eyes daily.   Yes [provider]  LORazepam (ATIVAN) 1 MG tablet TAKE ONE TABLET BY MOUTH EVERY EIGHT HOURS AS NEEDED FOR ANXIETY 09/25/17  Yes Dena Billet B, PA-C  metoprolol succinate (TOPROL-XL) 25 MG 24 hr tablet TAKE ONE (1) TABLET BY MOUTH EVERY DAY 09/11/17  Yes Jettie Booze, MD  nitroGLYCERIN (NITROSTAT) 0.4 MG SL tablet Place 1 tablet (0.4 mg total) under the tongue every 5 (five) minutes as needed for chest pain. 12/23/16  Yes Arbutus Leas, NP  pantoprazole (PROTONIX)  40 MG tablet Take one (1) tablet (40 mg) by mouth daily. 02/21/17  Yes Jettie Booze, MD  rosuvastatin (CRESTOR) 5 MG tablet TAKE 5 mg  EVERY DAY 07/07/17  Yes Jettie Booze, MD  Spacer/Aero-Holding [facility] by Does not apply route.   Yes [provider]  tamsulosin (FLOMAX) 0.4 MG CAPS capsule TAKE 1 CAPSULE EVERY DAY 10/18/17  Yes [name], MD  triamcinolone (KENALOG) 0.025 % cream Apply 1 application topically 2 (two) times daily. 10/04/17  Yes Orlena Sheldon, PA-C     Allergies  Allergen Reactions  . Feldene [Piroxicam] Other (See Comments)    REACTION: Blisters  . Statins Other (See Comments)    Myalgias  . Doxycycline Other (See Comments)    REACTION:  unknown  . Latex Rash  . Tequin Anxiety  . Valium Other (See Comments)    REACTION:  unknown     Family History  Problem Relation Age of Onset  . Arthritis-Osteo Sister   . Heart attack Brother   . Heart attack Other   . Cancer Neg Hx      Social History   Socioeconomic History  . Marital status: Widowed    Spouse name: Not on file  . Number of children: Not on file  . Years of education: Not on file  . Highest education level: Not on file  Occupational History  . Not on file  Social Needs  . Financial resource strain: Not on file  . Food insecurity:    Worry: Not on file    Inability: Not on file  . Transportation needs:    Medical: Not on file    Non-medical: Not on file  Tobacco Use  . Smoking status: Never Smoker  . Smokeless tobacco: Former Systems developer    Types: Chew  . Tobacco comment: "quit chewing in the 1960s"  Substance and Sexual Activity  . Alcohol use: No  . Drug use: No  . Sexual activity: Never  Lifestyle  . Physical activity:    Days per week: Not on file    Minutes per session: Not on file  . Stress: Not on file  Relationships  . Social connections:    Talks on phone: Not on file    Gets together: Not on file    Attends religious service: Not on file     Active member of club or organization: Not on file    Attends meetings of clubs or organizations: Not on file    Relationship status: Not on file  . Intimate partner violence:    Fear of current or ex partner: Not on file    Emotionally abused: Not on file    Physically abused: Not on file    Forced sexual activity: Not on file  Other Topics Concern  . Not  on file  Social History Narrative  . Not on file     Review of Systems  Constitutional: Negative.   HENT: Negative.   Eyes: Negative.   Respiratory: Negative.   Cardiovascular: Negative.   Gastrointestinal: Negative.   Endocrine: Negative.   Genitourinary: Negative.   Musculoskeletal: Negative.   Skin: Negative.   Allergic/Immunologic: Negative.   Neurological: Negative.   Hematological: Negative.   Psychiatric/Behavioral: Negative.   All other systems reviewed and are negative.      Objective:    Vitals:   10/26/17 0908  BP: 128/68  Pulse: 72  Resp: 14  Temp: 98.7 F (37.1 C)  TempSrc: Oral  SpO2: 97%  Weight: 167 lb (75.8 kg)  Height: 5\' 5"  (1.651 m)      Physical Exam  Constitutional: He appears well-developed. No distress.  Chronically ill-appearing elderly male, appears stated age, no acute distress  HENT:  Head: Normocephalic and atraumatic.  Nose: Nose normal.  Mouth/Throat: Oropharynx is clear and moist.  Eyes: Conjunctivae are normal. Right eye exhibits no discharge. Left eye exhibits no discharge.  Neck: No tracheal deviation present.  Cardiovascular: Normal rate and regular rhythm.  Pulmonary/Chest: Effort normal. No stridor. No respiratory distress.  Musculoskeletal: Normal range of motion.  Neurological: He is alert. He exhibits normal muscle tone. Coordination normal.  Skin: Skin is warm and dry. He is not diaphoretic.  Faint erythematous confluent rash to left groin with scattered satellite lesions, no edema, induration, fluctuance, excoriations of skin in that area  Psychiatric: He  has a normal mood and affect. His behavior is normal.  Nursing note and vitals reviewed.         Assessment & Plan:      ICD-10-CM   1. Rash and nonspecific skin eruption R21   Rash to left groin and skin folds, suspicious for candidal rash or intertrigo.  Onset 1 week ago, patient has complicated medical history but is not currently undergoing any cancer treatment.  He does have a immune modulator medication for RA, does have some history of oral thrush in the past.  Patient's rash significantly improved over the past day after patient started using miconazole spray/powder.  Advised to continue twice a day until resolved and then continue for 1 more week. I reviewed his most recent lab work and he does have normal liver function.  In the event of worsening rash she was given 1 dose of 100 mg Diflucan to take once by mouth.  If his rash does not resolve with topical or 1 dose of oral medication he was instructed to return for recheck.    Delsa Grana, PA-C 10/26/17 9:29 AM

## 2017-10-26 NOTE — Telephone Encounter (Signed)
Last OV 05/04/2017 Last refill 09/25/2017 Ok to refill

## 2017-10-27 MED ORDER — LORAZEPAM 1 MG PO TABS
ORAL_TABLET | ORAL | 0 refills | Status: DC
Start: 2017-10-27 — End: 2017-11-24

## 2017-10-27 NOTE — Telephone Encounter (Signed)
He is only supposed to take ativan three times a day (every 8 hrs) I would not take it at night as well.  I think that is too much given his age.  Just confirm with daughter Evan [name] (also our patient).

## 2017-10-27 NOTE — Telephone Encounter (Signed)
Call placed to patient daughter to discuss patient ativan medication.Per patient daughter [name] he is taking a total of 2.5 and [name] was informed to not give rx to patient at night as well. Evan [name] verbalizes understanding

## 2017-11-04 DIAGNOSIS — R269 Unspecified abnormalities of gait and mobility: Secondary | ICD-10-CM | POA: Diagnosis not present

## 2017-11-04 DIAGNOSIS — J4520 Mild intermittent asthma, uncomplicated: Secondary | ICD-10-CM | POA: Diagnosis not present

## 2017-11-04 DIAGNOSIS — R296 Repeated falls: Secondary | ICD-10-CM | POA: Diagnosis not present

## 2017-11-04 DIAGNOSIS — R0602 Shortness of breath: Secondary | ICD-10-CM | POA: Diagnosis not present

## 2017-11-04 DIAGNOSIS — J449 Chronic obstructive pulmonary disease, unspecified: Secondary | ICD-10-CM | POA: Diagnosis not present

## 2017-11-09 ENCOUNTER — Ambulatory Visit (INDEPENDENT_AMBULATORY_CARE_PROVIDER_SITE_OTHER): Payer: Medicare HMO | Admitting: Family Medicine

## 2017-11-09 ENCOUNTER — Encounter: Payer: Self-pay | Admitting: Family Medicine

## 2017-11-09 VITALS — BP 130/70 | HR 68 | Temp 98.4°F | Resp 16 | Ht 68.0 in | Wt 177.0 lb

## 2017-11-09 DIAGNOSIS — L304 Erythema intertrigo: Secondary | ICD-10-CM

## 2017-11-09 DIAGNOSIS — L299 Pruritus, unspecified: Secondary | ICD-10-CM | POA: Diagnosis not present

## 2017-11-09 MED ORDER — CLOTRIMAZOLE-BETAMETHASONE 1-0.05 % EX CREA: 1.0000 "application " | TOPICAL_CREAM | Freq: Two times a day (BID) | CUTANEOUS | 0 refills | Status: DC

## 2017-11-09 NOTE — Progress Notes (Signed)
Subjective:    Patient ID: Evan [name], male    DOB: May 15, 1927, 84 y.o.   MRN: [id number]  HPI Patient is here today with his 2 daughters.  He reports a rash primarily in his groin.  In the intertriginous space between his scrotum and his upper medial thigh bilaterally, there is pink irritated skin.  There is no abscess.  There are no papules.  There are no vesicles.  The erythema is well circumscribed with distinct borders and appears to be intertrigo.  He has been treated with Lotrimin and Diflucan with minimal relief.  When he is taking the medication is better but as soon as he stops it it comes right back.  He is wearing coveralls every day and frequently works outside constantly sweating.  He also complains of itching on his upper chest.  There is no visible rash.  He has a history of neuropathic itching that he has seen dermatology for in the past and was given gabapentin.  I suspect that this is causing the itching on his upper chest as there is no visible rash to treat Past Medical History:  Diagnosis Date  . Adenocarcinoma of right lung ([place]) 01/06/2016  . Anginal chest pain at rest [facility])    Chronic non, controlled on Lorazepam  . Anxiety   . Arthritis    "all over"   . BPH (benign prostatic hyperplasia)   . CAD (coronary artery disease)   . Chronic lower back pain   . Complication of anesthesia    "problems making water afterwards"  . Depression   . DJD (degenerative joint disease)   . Esophageal ulcer without bleeding    bid ppi indefinitely  . Family history of adverse reaction to anesthesia    "children w/PONV"  . GERD (gastroesophageal reflux disease)   . Headache    "couple/week maybe" (09/04/2015)  . Hemochromatosis    Possible, elevated iron stores, hook-like osteophytes on hand films, normal LFTs  . Hepatitis    "yellow jaundice as a baby"  . History of ASCVD    MULTIVESSEL  . Hyperlipidemia   . Hypertension   . Myocardial infarction ([place]) 1999  .  Osteoarthritis   . Pneumonia    "several times; got a little now" (09/04/2015)  . Rheumatoid arthritis ([place])   . Subdural hematoma ([facility])    small after fall 09/2016-plavix held, neurosurgery consulted.  Asymptomatic.     Past Surgical History:  Procedure Laterality Date  . ARM SKIN LESION BIOPSY / EXCISION Left 09/02/2015  . CATARACT EXTRACTION W/ INTRAOCULAR LENS  IMPLANT, BILATERAL Bilateral   . CHOLECYSTECTOMY OPEN    . CORNEAL TRANSPLANT Bilateral    "one at [facility]; one at [facility]"  . CORONARY ANGIOPLASTY WITH STENT PLACEMENT    . CORONARY ARTERY BYPASS GRAFT  1999  . DESCENDING AORTIC ANEURYSM REPAIR W/ STENT     DE stent ostium into the right radial free graft at OM-, 02-2007  . ESOPHAGOGASTRODUODENOSCOPY N/A 11/09/2014   Procedure: ESOPHAGOGASTRODUODENOSCOPY (EGD);  Surgeon: [name], MD;  Location: [facility] ENDOSCOPY;  Service: Endoscopy;  Laterality: N/A;  . INGUINAL HERNIA REPAIR    . JOINT REPLACEMENT    . LEFT HEART CATH AND CORS/GRAFTS ANGIOGRAPHY N/A 06/27/2017   Procedure: LEFT HEART CATH AND CORS/GRAFTS ANGIOGRAPHY;  Surgeon: [name], MD;  Location: [facility];  Service: Cardiovascular;  Laterality: N/A;  . NASAL SINUS SURGERY    . SHOULDER OPEN ROTATOR CUFF REPAIR Right   .  TOTAL KNEE ARTHROPLASTY Bilateral    Current Outpatient Medications on File Prior to Visit  Medication Sig Dispense Refill  . apixaban (ELIQUIS) 5 MG TABS tablet Take 1 tablet (5 mg total) by mouth 2 (two) times daily. 60 tablet 11  . chlorhexidine (PERIDEX) 0.12 % solution SWISH AND SPIT 10ML BY MOUTH TWICE A DAY  0  . Cholecalciferol 2000 units TABS Take 1 tablet (2,000 Units total) by mouth daily. 30 each 11  . FLUoxetine (PROZAC) 20 MG capsule TAKE 20 mg  BY MOUTH DAILY 90 capsule 1  . leflunomide (ARAVA) 10 MG tablet Take 10 mg by mouth daily.    Marland Kitchen Lifitegrast (XIIDRA) 5 % SOLN Place 1-2 drops into both eyes daily.    Marland Kitchen LORazepam (ATIVAN) 1 MG tablet TAKE ONE TABLET BY MOUTH EVERY EIGHT  HOURS AS NEEDED FOR ANXIETY 90 tablet 0  . metoprolol succinate (TOPROL-XL) 25 MG 24 hr tablet TAKE ONE (1) TABLET BY MOUTH EVERY DAY 30 tablet 10  . nitroGLYCERIN (NITROSTAT) 0.4 MG SL tablet Place 1 tablet (0.4 mg total) under the tongue every 5 (five) minutes as needed for chest pain. 25 tablet 2  . pantoprazole (PROTONIX) 40 MG tablet Take one (1) tablet (40 mg) by mouth daily. 90 tablet 3  . rosuvastatin (CRESTOR) 5 MG tablet TAKE 5 mg  EVERY DAY 90 tablet 3  . tamsulosin (FLOMAX) 0.4 MG CAPS capsule TAKE 1 CAPSULE EVERY DAY 90 capsule 3  . furosemide (LASIX) 40 MG tablet Take 1 tablet (40 mg total) by mouth daily as needed (swelling). (Patient not taking: Reported on 11/09/2017) 30 tablet 0  . ipratropium-albuterol (DUONEB) 0.5-2.5 (3) MG/3ML SOLN Take 3 mLs by nebulization every 4 (four) hours as needed (or Chest Pain / Tightness). (Patient not taking: Reported on 11/09/2017) 360 mL 3   No current facility-administered medications on file prior to visit.    Allergies  Allergen Reactions  . Feldene [Piroxicam] Other (See Comments)    REACTION: Blisters  . Statins Other (See Comments)    Myalgias  . Doxycycline Other (See Comments)    REACTION:  unknown  . Latex Rash  . Tequin Anxiety  . Valium Other (See Comments)    REACTION:  unknown   Social History   Socioeconomic History  . Marital status: Widowed    Spouse name: Not on file  . Number of children: Not on file  . Years of education: Not on file  . Highest education level: Not on file  Occupational History  . Not on file  Social Needs  . Financial resource strain: Not on file  . Food insecurity:    Worry: Not on file    Inability: Not on file  . Transportation needs:    Medical: Not on file    Non-medical: Not on file  Tobacco Use  . Smoking status: Never Smoker  . Smokeless tobacco: Former Systems developer    Types: Chew  . Tobacco comment: "quit chewing in the 1960s"  Substance and Sexual Activity  . Alcohol use: No  .  Drug use: No  . Sexual activity: Never  Lifestyle  . Physical activity:    Days per week: Not on file    Minutes per session: Not on file  . Stress: Not on file  Relationships  . Social connections:    Talks on phone: Not on file    Gets together: Not on file    Attends religious service: Not on file    Active  member of club or organization: Not on file    Attends meetings of clubs or organizations: Not on file    Relationship status: Not on file  . Intimate partner violence:    Fear of current or ex partner: Not on file    Emotionally abused: Not on file    Physically abused: Not on file    Forced sexual activity: Not on file  Other Topics Concern  . Not on file  Social History Narrative  . Not on file      Review of Systems  All other systems reviewed and are negative.      Objective:   Physical Exam  Constitutional: He appears well-developed and well-nourished. No distress.  HENT:  Right Ear: External ear normal.  Left Ear: External ear normal.  Nose: Nose normal.  Mouth/Throat: Oropharynx is clear and moist. No oropharyngeal exudate.  Eyes: Conjunctivae are normal.  Neck: Neck supple. No JVD present.  Cardiovascular: Normal rate, regular rhythm and normal heart sounds.  No murmur heard. Pulmonary/Chest: Effort normal and breath sounds normal. No respiratory distress. He has no wheezes. He has no rales.  Abdominal: Soft. Bowel sounds are normal. He exhibits no distension. There is no tenderness. There is no rebound and no guarding.  Genitourinary:     Musculoskeletal: He exhibits no edema.  Lymphadenopathy:    He has no cervical adenopathy.  Skin: Rash noted. He is not diaphoretic. There is erythema.  Vitals reviewed.         Assessment & Plan:  Intertrigo  Pruritus I believe the itching on his upper chest is most likely neuropathic in nature.  There is no cream that I would specifically recommend for this.  He is tried and failed numerous  medications in the past for neuropathic itching.  I believe some of it is likely anxiety related.  However the rash in his perineum appears to be intertrigo.  I explained that this is due to friction, heat, moisture.  I recommend that he begin wearing shorts to allow that area to get fresh air throughout the day to stay dry.  I recommend that he take a shower every morning and apply Lotrisone cream after drying off thoroughly and also using a hair dryer to dry the area thoroughly.  I recommend that he use Goldbond powder during the day to keep that area dry.  I recommend that he apply the Lotrisone cream at night or to going to bed.  After 7 to 14 days, discontinue Lotrisone cream and switch to Goldbond powder once or twice a day in an effort to keep that area dry.  Wear more breathable garments to allow more fresh air and to decrease sweating and moisture in that area.

## 2017-11-24 ENCOUNTER — Other Ambulatory Visit: Payer: Self-pay | Admitting: Family Medicine

## 2017-11-24 NOTE — Telephone Encounter (Signed)
Requesting refill    Lorazepam  LOV: 11/09/17  LRF: 10/27/17

## 2017-12-02 ENCOUNTER — Other Ambulatory Visit: Payer: Self-pay

## 2017-12-02 ENCOUNTER — Emergency Department (HOSPITAL_COMMUNITY): Payer: Medicare HMO

## 2017-12-02 ENCOUNTER — Encounter (HOSPITAL_COMMUNITY): Payer: Self-pay | Admitting: Emergency Medicine

## 2017-12-02 ENCOUNTER — Emergency Department (HOSPITAL_COMMUNITY)
Admission: EM | Admit: 2017-12-02 | Discharge: 2017-12-02 | Disposition: A | Discharge status: Home / Self Care | Payer: Medicare HMO | Attending: Emergency Medicine | Admitting: Emergency Medicine

## 2017-12-02 DIAGNOSIS — J45909 Unspecified asthma, uncomplicated: Secondary | ICD-10-CM | POA: Insufficient documentation

## 2017-12-02 DIAGNOSIS — I5023 Acute on chronic systolic (congestive) heart failure: Secondary | ICD-10-CM

## 2017-12-02 DIAGNOSIS — Z79899 Other long term (current) drug therapy: Secondary | ICD-10-CM | POA: Insufficient documentation

## 2017-12-02 DIAGNOSIS — I110 Hypertensive heart disease with heart failure: Secondary | ICD-10-CM | POA: Diagnosis not present

## 2017-12-02 DIAGNOSIS — R0602 Shortness of breath: Secondary | ICD-10-CM | POA: Diagnosis not present

## 2017-12-02 DIAGNOSIS — I5030 Unspecified diastolic (congestive) heart failure: Secondary | ICD-10-CM | POA: Insufficient documentation

## 2017-12-02 DIAGNOSIS — I2510 Atherosclerotic heart disease of native coronary artery without angina pectoris: Secondary | ICD-10-CM | POA: Insufficient documentation

## 2017-12-02 DIAGNOSIS — I5033 Acute on chronic diastolic (congestive) heart failure: Secondary | ICD-10-CM | POA: Diagnosis not present

## 2017-12-02 LAB — CBC
HCT: 39.3 % (ref 39.0–52.0)
Hemoglobin: 12.7 g/dL — ABNORMAL LOW (ref 13.0–17.0)
MCH: 30.8 pg (ref 26.0–34.0)
MCHC: 32.3 g/dL (ref 30.0–36.0)
MCV: 95.4 fL (ref 78.0–100.0)
Platelets: 100 10*3/uL — ABNORMAL LOW (ref 150–400)
RBC: 4.12 MIL/uL — ABNORMAL LOW (ref 4.22–5.81)
RDW: 13.8 % (ref 11.5–15.5)
WBC: 5.4 10*3/uL (ref 4.0–10.5)

## 2017-12-02 LAB — BASIC METABOLIC PANEL
Anion gap: 8 (ref 5–15)
BUN: 19 mg/dL (ref 8–23)
CO2: 21 mmol/L — ABNORMAL LOW (ref 22–32)
Calcium: 8.9 mg/dL (ref 8.9–10.3)
Chloride: 108 mmol/L (ref 98–111)
Creatinine, Ser: 1.2 mg/dL (ref 0.61–1.24)
GFR calc Af Amer: >60 mL/min (ref >60)
GFR calc non Af Amer: 52 mL/min — ABNORMAL LOW (ref >60)
Glucose, Bld: 149 mg/dL — ABNORMAL HIGH (ref 70–99)
Potassium: 4 mmol/L (ref 3.5–5.1)
Sodium: 137 mmol/L (ref 135–145)

## 2017-12-02 LAB — BRAIN NATRIURETIC PEPTIDE: B Natriuretic Peptide: 629.1 pg/mL — ABNORMAL HIGH (ref 0.0–100.0)

## 2017-12-02 LAB — I-STAT TROPONIN, ED: Troponin i, poc: 0.04 ng/mL (ref 0.00–0.08)

## 2017-12-02 MED ORDER — IOPAMIDOL (ISOVUE-370) INJECTION 76%
100.0000 mL | Freq: Once | INTRAVENOUS | Status: AC | PRN
  Administered 2017-12-02: 100 mL via INTRAVENOUS

## 2017-12-02 MED ORDER — FUROSEMIDE 10 MG/ML IJ SOLN
40.0000 mg | Freq: Once | INTRAMUSCULAR | Status: AC
  Administered 2017-12-02: 40 mg via INTRAVENOUS
  Filled 2017-12-02: qty 4

## 2017-12-02 MED ORDER — SODIUM CHLORIDE 0.9 % IV BOLUS
500.0000 mL | Freq: Once | INTRAVENOUS | Status: AC
  Administered 2017-12-02: 250 mL via INTRAVENOUS

## 2017-12-02 NOTE — ED Provider Notes (Signed)
[facility] EMERGENCY DEPARTMENT Provider Note   CSN: [id number] Arrival date & time: 12/02/17  7510     History   Chief Complaint Chief Complaint  Patient presents with  . Shortness of Breath    HPI Evan [name] is a 84 y.o. male.  84 yo M with a chief complaint of shortness of breath.  This is been off and on for the past couple months but worsening over the past few days.  Usually is worse first thing in the morning when he wakes up.  Usually improves with his inhaler.  Patient denies any significant cough denies any sputum or change in sputum.  He denies fevers or chills.  Denies any chest pain with this per se.  Feels that his chest is somewhat tight.  Denies lower extremity edema denies recent surgery denies history of cancer.  He then stated that he had a nodule on his lung that received radiation.  Denies sick contacts.  Denies abdominal pain nausea vomiting or diarrhea.  Denies diaphoresis.  The history is provided by the patient and a relative.  Illness  This is a new problem. The current episode started more than 1 week ago. The problem occurs constantly. The problem has been rapidly worsening. Associated symptoms include shortness of breath. Pertinent negatives include no chest pain, no abdominal pain and no headaches. Nothing aggravates the symptoms. Nothing relieves the symptoms. He has tried nothing for the symptoms.    Past Medical History:  Diagnosis Date  . Adenocarcinoma of right lung ([place]) 01/06/2016  . Anginal chest pain at rest [facility])    Chronic non, controlled on Lorazepam  . Anxiety   . Arthritis    "all over"   . BPH (benign prostatic hyperplasia)   . CAD (coronary artery disease)   . Chronic lower back pain   . Complication of anesthesia    "problems making water afterwards"  . Depression   . DJD (degenerative joint disease)   . Esophageal ulcer without bleeding    bid ppi indefinitely  . Family history of adverse reaction to  anesthesia    "children w/PONV"  . GERD (gastroesophageal reflux disease)   . Headache    "couple/week maybe" (09/04/2015)  . Hemochromatosis    Possible, elevated iron stores, hook-like osteophytes on hand films, normal LFTs  . Hepatitis    "yellow jaundice as a baby"  . History of ASCVD    MULTIVESSEL  . Hyperlipidemia   . Hypertension   . Myocardial infarction ([place]) 1999  . Osteoarthritis   . Pneumonia    "several times; got a little now" (09/04/2015)  . Rheumatoid arthritis ([place])   . Subdural hematoma ([facility])    small after fall 09/2016-plavix held, neurosurgery consulted.  Asymptomatic.      Patient Active Problem List   Diagnosis Date Noted  . Non-ST elevation MI (NSTEMI) ([place]) 06/27/2017  . Diastolic CHF ([place]) 25/85/2778  . Chest pain due to CAD 06/26/2017  . Dyspnea 06/21/2017  . Acute bronchitis 05/15/2017  . Oral candidiasis 05/15/2017  . Acute pericarditis   . Chest pain at rest 12/20/2016  . Angina pectoris ([place]) 12/20/2016  . Radiation pneumonitis ([place]) 11/09/2016  . Pleurisy 10/11/2016  . Chest wall pain 10/11/2016  . Atrial fibrillation ([place]) 05/21/2016  . Adenocarcinoma of right lung ([place]) 01/06/2016  . GAD (generalized anxiety disorder) 11/20/2015  . Essential hypertension   . Cellulitis 09/04/2015  . Cellulitis of left axilla   . Esophageal ulcer without  bleeding   . Bilateral lower extremity edema 04/28/2014  . BPH (benign prostatic hyperplasia) 10/08/2012  . Anxiety   . EKG abnormalities 09/05/2012  . Tremor 09/05/2012  . Chronic fatigue 09/05/2012  . Arthritis   . Asthma, chronic   . Reflux esophagitis   . S/P CABG (coronary artery bypass graft)   . GERD (gastroesophageal reflux disease)   . Hyperlipidemia   . Hypertension   . UNSPECIFIED PERIPHERAL VASCULAR DISEASE 06/16/2009    Past Surgical History:  Procedure Laterality Date  . ARM SKIN LESION BIOPSY / EXCISION Left 09/02/2015  . CATARACT EXTRACTION W/ INTRAOCULAR LENS  IMPLANT, BILATERAL  Bilateral   . CHOLECYSTECTOMY OPEN    . CORNEAL TRANSPLANT Bilateral    "[facility]; one [facility]"  . CORONARY ANGIOPLASTY WITH STENT PLACEMENT    . CORONARY ARTERY BYPASS GRAFT  1999  . DESCENDING AORTIC ANEURYSM REPAIR W/ STENT     DE stent ostium into the right radial free graft at OM-, 02-2007  . ESOPHAGOGASTRODUODENOSCOPY N/A 11/09/2014   Procedure: ESOPHAGOGASTRODUODENOSCOPY (EGD);  Surgeon: [name], MD;  Location: [facility];  Service: Endoscopy;  Laterality: N/A;  . INGUINAL HERNIA REPAIR    . JOINT REPLACEMENT    . LEFT HEART CATH AND CORS/GRAFTS ANGIOGRAPHY N/A 06/27/2017   Procedure: LEFT HEART CATH AND CORS/GRAFTS ANGIOGRAPHY;  Surgeon: [name], MD;  Location: [facility];  Service: Cardiovascular;  Laterality: N/A;  . NASAL SINUS SURGERY    . SHOULDER OPEN ROTATOR CUFF REPAIR Right   . TOTAL KNEE ARTHROPLASTY Bilateral         Home Medications    Prior to Admission medications   Medication Sig Start Date End Date Taking? Authorizing Provider  apixaban (ELIQUIS) 5 MG TABS tablet Take 1 tablet (5 mg total) by mouth 2 (two) times daily. 01/31/17  Yes Jettie Booze, MD  Cholecalciferol 2000 units TABS Take 1 tablet (2,000 Units total) by mouth daily. 02/04/16  Yes PickardCammie Mcgee, MD  ENSURE (ENSURE) Take 237 mLs by mouth 2 (two) times daily between meals.   Yes [provider]  FLUoxetine (PROZAC) 20 MG capsule TAKE 20 mg  BY MOUTH DAILY 12/23/16  Yes Kilroy, Luke K, PA-C  ipratropium-albuterol (DUONEB) 0.5-2.5 (3) MG/3ML SOLN Take 3 mLs by nebulization every 4 (four) hours as needed (or Chest Pain / Tightness). 12/23/16  Yes Kilroy, Luke K, PA-C  leflunomide (ARAVA) 10 MG tablet Take 10 mg by mouth daily. 06/30/17  Yes [provider]  Lifitegrast Shirley Friar) 5 % SOLN Place 1-2 drops into both eyes daily.   Yes [provider]  LORazepam (ATIVAN) 1 MG tablet TAKE ONE TABLET BY MOUTH EVERY EIGHT HOURS AS NEEDED FOR  ANXIETY Patient taking differently: Take 1 mg by mouth every 8 (eight) hours as needed for anxiety.  11/24/17  Yes [name], MD  metoprolol succinate (TOPROL-XL) 25 MG 24 hr tablet TAKE ONE (1) TABLET BY MOUTH EVERY DAY Patient taking differently: Take 25 mg by mouth daily.  09/11/17  Yes Jettie Booze, MD  nitroGLYCERIN (NITROSTAT) 0.4 MG SL tablet Place 1 tablet (0.4 mg total) under the tongue every 5 (five) minutes as needed for chest pain. 12/23/16  Yes Arbutus Leas, NP  pantoprazole (PROTONIX) 40 MG tablet Take one (1) tablet (40 mg) by mouth daily. 02/21/17  Yes Jettie Booze, MD  rosuvastatin (CRESTOR) 5 MG tablet TAKE 5 mg  EVERY DAY Patient taking differently: Take 5  mg by mouth at bedtime.  07/07/17  Yes Jettie Booze, MD  tamsulosin (FLOMAX) 0.4 MG CAPS capsule TAKE 1 CAPSULE EVERY DAY Patient taking differently: Take 0.4 mg by mouth daily.  10/18/17  Yes [name], MD  clotrimazole-betamethasone (LOTRISONE) cream Apply 1 application topically 2 (two) times daily. Patient not taking: Reported on 12/02/2017 11/09/17   [name], MD  furosemide (LASIX) 40 MG tablet Take 1 tablet (40 mg total) by mouth daily as needed (swelling). Patient not taking: Reported on 11/09/2017 07/07/17   Jettie Booze, MD    Family History Family History  Problem Relation Age of Onset  . Arthritis-Osteo Sister   . Heart attack Brother   . Heart attack Other   . Cancer Neg Hx     Social History Social History   Tobacco Use  . Smoking status: Never Smoker  . Smokeless tobacco: Former Systems developer    Types: Chew  . Tobacco comment: "quit chewing in the 1960s"  Substance Use Topics  . Alcohol use: No  . Drug use: No     Allergies   Feldene [piroxicam]; Statins; Doxycycline; Latex; Tequin; and Valium   Review of Systems Review of Systems  Constitutional: Negative for chills and fever.  HENT: Negative for congestion and facial swelling.   Eyes: Negative for  discharge and visual disturbance.  Respiratory: Positive for shortness of breath.   Cardiovascular: Negative for chest pain and palpitations.  Gastrointestinal: Negative for abdominal pain, diarrhea and vomiting.  Musculoskeletal: Negative for arthralgias and myalgias.  Skin: Negative for color change and rash.  Neurological: Negative for tremors, syncope and headaches.  Psychiatric/Behavioral: Negative for confusion and dysphoric mood.     Physical Exam Updated Vital Signs BP 123/85   Pulse 60   Temp 98.7 F (37.1 C) (Oral)   Resp 20   SpO2 99%   Physical Exam  Constitutional: He is oriented to person, place, and time. He appears well-developed and well-nourished.  HENT:  Head: Normocephalic and atraumatic.  Eyes: Pupils are equal, round, and reactive to light. EOM are normal.  Neck: Normal range of motion. Neck supple. No JVD present.  Cardiovascular: Normal rate and regular rhythm. Exam reveals no gallop and no friction rub.  No murmur heard. Pulmonary/Chest: No respiratory distress. He has no wheezes.  Diminished breath sounds with scattered rhonchi just to the left lung field.  Heard both upper and lower.  Abdominal: He exhibits no distension. There is no rebound and no guarding.  Musculoskeletal: Normal range of motion.  Neurological: He is alert and oriented to person, place, and time.  Skin: No rash noted. No pallor.  Psychiatric: He has a normal mood and affect. His behavior is normal.  Nursing note and vitals reviewed.    ED Treatments / Results  Labs (all labs ordered are listed, but only abnormal results are displayed) Labs Reviewed  BASIC METABOLIC PANEL - Abnormal; Notable for the following components:      Result Value   CO2 21 (*)    Glucose, Bld 149 (*)    GFR calc non Af Amer 52 (*)    All other components within normal limits  CBC - Abnormal; Notable for the following components:   RBC 4.12 (*)    Hemoglobin 12.7 (*)    Platelets 100 (*)    All  other components within normal limits  BRAIN NATRIURETIC PEPTIDE - Abnormal; Notable for the following components:   B Natriuretic Peptide 629.1 (*)  All other components within normal limits  I-STAT TROPONIN, ED    EKG EKG Interpretation  Date/Time:  Saturday December 02 2017 09:32:49 EDT Ventricular Rate:  65 PR Interval:  286 QRS Duration: 100 QT Interval:  462 QTC Calculation: 480 R Axis:   -19 Text Interpretation:   Poor data quality, interpretation may be adversely affected Undetermined rhythm Moderate voltage criteria for LVH, may be normal variant Cannot rule out Septal infarct , age undetermined Abnormal ECG flipped t waves in inferior and lateral leads now upright Otherwise no significant change Confirmed by Deno Etienne [phone]) on 12/02/2017 9:51:35 AM   Radiology Dg Chest 2 View  Result Date: 12/02/2017 CLINICAL DATA:  Awoke this morning with shortness of breath and chest tightness. EXAM: CHEST - 2 VIEW COMPARISON:  06/27/2017 FINDINGS: Stable changes from prior CABG surgery. Cardiac silhouette is top-normal in size. No mediastinal or hilar masses. No convincing adenopathy. Prominent bronchovascular markings, stable. Linear opacities at the lung bases consistent with scarring, subsegmental atelectasis or a combination. There is no evidence of pneumonia. No pulmonary edema. No pleural effusion or pneumothorax. Skeletal structures are demineralized but intact. IMPRESSION: No acute cardiopulmonary disease. Electronically Signed   By: Lajean Manes M.D.   On: 12/02/2017 10:10   Ct Angio Chest Pe W And/or Wo Contrast  Result Date: 12/02/2017 CLINICAL DATA:  84 year old male with left chest tightness and shortness of breath EXAM: CT ANGIOGRAPHY CHEST WITH CONTRAST TECHNIQUE: Multidetector CT imaging of the chest was performed using the standard protocol during bolus administration of intravenous contrast. Multiplanar CT image reconstructions and MIPs were obtained to evaluate the  vascular anatomy. CONTRAST:  163mL ISOVUE-370 IOPAMIDOL (ISOVUE-370) INJECTION 76% COMPARISON:  Most recent prior chest CT 08/22/2017 FINDINGS: Cardiovascular: Excellent opacification of the pulmonary arteries to the subsegmental level. No evidence of central filling defect to suggest acute pulmonary embolus. Normal caliber thoracic aorta. No evidence of dissection. Mild atherosclerotic calcifications. Cardiomegaly with marked left atrial enlargement. Circumferential hypertrophy of the left ventricular myocardium. No pericardial effusion. Calcifications are present along the coronary arteries. Mediastinum/Nodes: Unremarkable CT appearance of the thyroid gland. No suspicious mediastinal or hilar adenopathy. No soft tissue mediastinal mass. The thoracic esophagus is unremarkable. Stable 1 cm right paratracheal lymph node. Lungs/Pleura: Mild interlobular septal thickening and patchy ground-glass attenuation airspace opacities throughout the lungs consistent with mild interstitial pulmonary edema. Slightly increased peribronchial cuffing diffusely. Similar degree of chronic atelectasis/scarring in the medial aspect of the right lower lobe. No new focal airspace consolidation, mass or nodule. Upper Abdomen: No acute abnormality in the visualized upper abdomen Musculoskeletal: Patient is status post median sternotomy. No acute fracture or aggressive appearing lytic or blastic osseous lesion. Review of the MIP images confirms the above findings. IMPRESSION: 1. Negative for acute pulmonary embolus. 2. Positive for mild interstitial pulmonary edema concerning for early CHF. 3. Cardiomegaly with left greater than right atrial enlargement. 4. Coronary artery calcifications. 5. Circumferential hypertrophy of the left ventricular myocardium suggests systemic arterial hypertension. 6. Stable radiation fibrosis in the medial inferior right lower lobe compared to 08/22/2017. Aortic Atherosclerosis (ICD10-I70.0). Electronically  Signed   By: Jacqulynn Cadet M.D.   On: 12/02/2017 12:30    Procedures Procedures (including critical care time)  Medications Ordered in ED Medications  furosemide (LASIX) injection 40 mg (has no administration in time range)  sodium chloride 0.9 % bolus 500 mL (250 mLs Intravenous New Bag/Given 12/02/17 1056)  iopamidol (ISOVUE-370) 76 % injection 100 mL (100 mLs Intravenous Contrast Given 12/02/17 1135)  Initial Impression / Assessment and Plan / ED Course  I have reviewed the triage vital signs and the nursing notes.  Pertinent labs & imaging results that were available during my care of the patient were reviewed by me and considered in my medical decision making (see chart for details).     84 yo M with a chief complaint of shortness of breath.  This been going on for a couple months but worsening over the past couple days.  On my exam the patient does look short of breath he does have rhonchi to the left lung field but a normal chest x-ray for him as viewed by me.  He does have some dense area to the posterior field seen on the lateral x-ray.  Read by radiology is chronic scarring.  Patient was significantly short of breath on exertion upon arriving to the ED.  His troponin is negative.  He has a very mild acidosis on BMP.  Hemoglobin is stable at 12.7.  At this point he must consider PE is a possibility with history of possible lung cancer that was treated with radiation.  Will obtain a CT angiogram of the chest.  CT scan is negative for PE.  He has signs of what looks like early heart failure.  I discussed result with him and he has had an issue with that previously.  On further discussion he has been having orthopnea and PND.  I think most likely this is a heart failure exacerbation.  He has prescribed Lasix and takes them as needed.  He has not taken them in the past couple months with was consistent with his worsening of symptoms at that time.  We will have him start taking them  again.  We will have him call his cardiologist.  His initial troponin was negative.  I do not feel that I need to repeat it as he is not had any change of his chest tightness in over 6 hours.  1:43 PM:  I have discussed the diagnosis/risks/treatment options with the patient and family and believe the pt to be eligible for discharge home to follow-up with PCP, cards. We also discussed returning to the ED immediately if new or worsening sx occur. We discussed the sx which are most concerning (e.g., sudden worsening pain, fever, inability to tolerate by mouth) that necessitate immediate return. Medications administered to the patient during their visit and any new prescriptions provided to the patient are listed below.  Medications given during this visit Medications  furosemide (LASIX) injection 40 mg (has no administration in time range)  sodium chloride 0.9 % bolus 500 mL (250 mLs Intravenous New Bag/Given 12/02/17 1056)  iopamidol (ISOVUE-370) 76 % injection 100 mL (100 mLs Intravenous Contrast Given 12/02/17 1135)     The patient appears reasonably screen and/or stabilized for discharge and I doubt any other medical condition or other [facility] requiring further screening, evaluation, or treatment in the ED at this time prior to discharge.    Final Clinical Impressions(s) / ED Diagnoses   Final diagnoses:  Acute on chronic systolic congestive heart failure [facility])    ED Discharge Orders    None       Deno Etienne, DO 12/02/17 1344

## 2017-12-02 NOTE — ED Triage Notes (Signed)
Pt states he woke up this morning with shortness of breath and tightness in his chest, pt wife states he was shaking. Pt states he could breath. Breath sounds normal. 98% on room air.

## 2017-12-05 DIAGNOSIS — R296 Repeated falls: Secondary | ICD-10-CM | POA: Diagnosis not present

## 2017-12-05 DIAGNOSIS — J4520 Mild intermittent asthma, uncomplicated: Secondary | ICD-10-CM | POA: Diagnosis not present

## 2017-12-05 DIAGNOSIS — R269 Unspecified abnormalities of gait and mobility: Secondary | ICD-10-CM | POA: Diagnosis not present

## 2017-12-05 DIAGNOSIS — R0602 Shortness of breath: Secondary | ICD-10-CM | POA: Diagnosis not present

## 2017-12-05 DIAGNOSIS — J449 Chronic obstructive pulmonary disease, unspecified: Secondary | ICD-10-CM | POA: Diagnosis not present

## 2017-12-06 ENCOUNTER — Other Ambulatory Visit: Payer: Self-pay | Admitting: Family Medicine

## 2017-12-06 ENCOUNTER — Telehealth: Payer: Self-pay | Admitting: Family Medicine

## 2017-12-06 ENCOUNTER — Encounter: Payer: Self-pay | Admitting: Cardiology

## 2017-12-06 ENCOUNTER — Ambulatory Visit: Payer: Medicare HMO | Admitting: Cardiology

## 2017-12-06 VITALS — BP 120/80 | HR 66 | Ht 68.0 in | Wt 174.0 lb

## 2017-12-06 DIAGNOSIS — I2510 Atherosclerotic heart disease of native coronary artery without angina pectoris: Secondary | ICD-10-CM

## 2017-12-06 DIAGNOSIS — I5033 Acute on chronic diastolic (congestive) heart failure: Secondary | ICD-10-CM

## 2017-12-06 DIAGNOSIS — I480 Paroxysmal atrial fibrillation: Secondary | ICD-10-CM

## 2017-12-06 DIAGNOSIS — I10 Essential (primary) hypertension: Secondary | ICD-10-CM | POA: Diagnosis not present

## 2017-12-06 MED ORDER — NITROGLYCERIN 0.4 MG SL SUBL: 0.4000 mg | SUBLINGUAL_TABLET | SUBLINGUAL | 2 refills | Status: DC | PRN

## 2017-12-06 MED ORDER — FUROSEMIDE 20 MG PO TABS: 20.0000 mg | ORAL_TABLET | Freq: Every day | ORAL | 3 refills | Status: DC

## 2017-12-06 MED ORDER — FUROSEMIDE 40 MG PO TABS: 40.0000 mg | ORAL_TABLET | Freq: Every day | ORAL | 0 refills | Status: DC

## 2017-12-06 NOTE — Progress Notes (Signed)
Cardiology Office Note:    Date:  12/06/2017   ID:  Evan [name], DOB 09-07-1927, MRN [id number]  PCP:  Evan Frizzle, MD  Cardiologist:  Evan Grooms, MD  Referring MD: Evan Frizzle, MD   Chief Complaint  Patient presents with  . Hospitalization Follow-up    heart failure    History of Present Illness:    Evan [name] is a 84 y.o. male with a past medical history significant for CAD s/p CABG X2 1998 and cath 2009 showed patent stents & grafts. Low risk nuclear stress test 2017 he also has history of lung cancer status post radiation therapy.  Hospitalization 12/2016 with pericarditis. 2D echo showed normal LVEF with mild AS, no pericardial effusion.He was noted to be in atrial fibrillation with spontaneous conversion to normal sinus rhythm. He was placed on high-dose aspirin and colchicine. He was not started on NOAC'sbecause of the need to be on high-dose aspirin with pericarditis. Plavix was stopped.He was still having chest painon10/30/18. He was told he could use NSAIDs in the morning with breakfast every couple of days.He was back in atrial fibrillation rate controlled. He was started on Eliquis 5 mg twice daily for CHA2DS2-VASc score of 4.  He went to the ED on Saturday due to shortness of breath and orthopnea. He was up all night due to inability to lay down. He tried breathing treatments which did not help. He feels that this had been pretty quick to come on although his daughters says that it may have been progressive over about a week. [name] says that his wt has been stable within a few pounds. He had not had any pedal edema but his abdomen was larger than usual. He has had no chest pain, palpitations, lightheadedness or syncope.   He is here today with his 2 daughters. He had not been taking any lasix for the prior 2 months and prior to that was only sporadically as needed. He has now been taking lasix 40 mg daily for the last 4 days. He feels  that his breathing is much better but still not back to his baseline. He now has no orthopnea.   He lives with his daughter. He eats a lot of salt on all his food.   Past Medical History:  Diagnosis Date  . Adenocarcinoma of right lung ([place]) 01/06/2016  . Anginal chest pain at rest [facility])    Chronic non, controlled on Lorazepam  . Anxiety   . Arthritis    "all over"   . BPH (benign prostatic hyperplasia)   . CAD (coronary artery disease)   . Chronic lower back pain   . Complication of anesthesia    "problems making water afterwards"  . Depression   . DJD (degenerative joint disease)   . Esophageal ulcer without bleeding    bid ppi indefinitely  . Family history of adverse reaction to anesthesia    "children w/PONV"  . GERD (gastroesophageal reflux disease)   . Headache    "couple/week maybe" (09/04/2015)  . Hemochromatosis    Possible, elevated iron stores, hook-like osteophytes on hand films, normal LFTs  . Hepatitis    "yellow jaundice as a baby"  . History of ASCVD    MULTIVESSEL  . Hyperlipidemia   . Hypertension   . Myocardial infarction ([place]) 1999  . Osteoarthritis   . Pneumonia    "several times; got a little now" (09/04/2015)  . Rheumatoid arthritis ([place])   . Subdural hematoma ([facility])  small after fall 09/2016-plavix held, neurosurgery consulted.  Asymptomatic.      Past Surgical History:  Procedure Laterality Date  . ARM SKIN LESION BIOPSY / EXCISION Left 09/02/2015  . CATARACT EXTRACTION W/ INTRAOCULAR LENS  IMPLANT, BILATERAL Bilateral   . CHOLECYSTECTOMY OPEN    . CORNEAL TRANSPLANT Bilateral    "one at [facility]; one at [facility]"  . CORONARY ANGIOPLASTY WITH STENT PLACEMENT    . CORONARY ARTERY BYPASS GRAFT  1999  . DESCENDING AORTIC ANEURYSM REPAIR W/ STENT     DE stent ostium into the right radial free graft at OM-, 02-2007  . ESOPHAGOGASTRODUODENOSCOPY N/A 11/09/2014   Procedure: ESOPHAGOGASTRODUODENOSCOPY (EGD);  Surgeon: [name], MD;  Location: [facility] ENDOSCOPY;   Service: Endoscopy;  Laterality: N/A;  . INGUINAL HERNIA REPAIR    . JOINT REPLACEMENT    . LEFT HEART CATH AND CORS/GRAFTS ANGIOGRAPHY N/A 06/27/2017   Procedure: LEFT HEART CATH AND CORS/GRAFTS ANGIOGRAPHY;  Surgeon: [name], MD;  Location: [place] [facility] LAB;  Service: Cardiovascular;  Laterality: N/A;  . NASAL SINUS SURGERY    . SHOULDER OPEN ROTATOR CUFF REPAIR Right   . TOTAL KNEE ARTHROPLASTY Bilateral     Current Medications: Current Meds  Medication Sig  . apixaban (ELIQUIS) 5 MG TABS tablet Take 1 tablet (5 mg total) by mouth 2 (two) times daily.  . Cholecalciferol 2000 units TABS Take 1 tablet (2,000 Units total) by mouth daily.  Marland Kitchen ENSURE (ENSURE) Take 237 mLs by mouth 2 (two) times daily between meals.  Marland Kitchen FLUoxetine (PROZAC) 20 MG capsule TAKE 20 mg  BY MOUTH DAILY  . furosemide (LASIX) 40 MG tablet Take 1 tablet (40 mg total) by mouth daily. FOR 5 DAYS.  Marland Kitchen ipratropium-albuterol (DUONEB) 0.5-2.5 (3) MG/3ML SOLN Take 3 mLs by nebulization every 4 (four) hours as needed (or Chest Pain / Tightness).  Marland Kitchen leflunomide (ARAVA) 10 MG tablet Take 10 mg by mouth daily.  Marland Kitchen Lifitegrast (XIIDRA) 5 % SOLN Place 1-2 drops into both eyes daily.  Marland Kitchen LORazepam (ATIVAN) 1 MG tablet TAKE ONE TABLET BY MOUTH EVERY EIGHT HOURS AS NEEDED FOR ANXIETY (Patient taking differently: Take 1 mg by mouth every 8 (eight) hours as needed for anxiety. )  . metoprolol succinate (TOPROL-XL) 25 MG 24 hr tablet TAKE ONE (1) TABLET BY MOUTH EVERY DAY (Patient taking differently: Take 25 mg by mouth daily. )  . nitroGLYCERIN (NITROSTAT) 0.4 MG SL tablet Place 1 tablet (0.4 mg total) under the tongue every 5 (five) minutes as needed for chest pain.  . pantoprazole (PROTONIX) 40 MG tablet Take one (1) tablet (40 mg) by mouth daily.  . rosuvastatin (CRESTOR) 5 MG tablet TAKE 5 mg  EVERY DAY (Patient taking differently: Take 5 mg by mouth at bedtime. )  . tamsulosin (FLOMAX) 0.4 MG CAPS capsule TAKE 1 CAPSULE EVERY  DAY (Patient taking differently: Take 0.4 mg by mouth daily. )  . [DISCONTINUED] clotrimazole-betamethasone (LOTRISONE) cream Apply 1 application topically 2 (two) times daily.  . [DISCONTINUED] furosemide (LASIX) 40 MG tablet Take 1 tablet (40 mg total) by mouth daily as needed (swelling).  . [DISCONTINUED] nitroGLYCERIN (NITROSTAT) 0.4 MG SL tablet Place 1 tablet (0.4 mg total) under the tongue every 5 (five) minutes as needed for chest pain.     Allergies:   Feldene [piroxicam]; Statins; Doxycycline; Latex; Tequin; and Valium   Social History   Socioeconomic History  . Marital status: Widowed    Spouse name: Not on file  .  Number of children: Not on file  . Years of education: Not on file  . Highest education level: Not on file  Occupational History  . Not on file  Social Needs  . Financial resource strain: Not on file  . Food insecurity:    Worry: Not on file    Inability: Not on file  . Transportation needs:    Medical: Not on file    Non-medical: Not on file  Tobacco Use  . Smoking status: Never Smoker  . Smokeless tobacco: Former Systems developer    Types: Chew  . Tobacco comment: "quit chewing in the 1960s"  Substance and Sexual Activity  . Alcohol use: No  . Drug use: No  . Sexual activity: Never  Lifestyle  . Physical activity:    Days per week: Not on file    Minutes per session: Not on file  . Stress: Not on file  Relationships  . Social connections:    Talks on phone: Not on file    Gets together: Not on file    Attends religious service: Not on file    Active member of club or organization: Not on file    Attends meetings of clubs or organizations: Not on file    Relationship status: Not on file  Other Topics Concern  . Not on file  Social History Narrative  . Not on file     Family History: The patient's family history includes Arthritis-Osteo in his sister; Heart attack in his brother and other. There is no history of Cancer. ROS:   Please see the history  of present illness.     All other systems reviewed and are negative.  EKGs/Labs/Other Studies Reviewed:    The following studies were reviewed today:  Left Heart Cath 06/27/17  Prox RCA lesion is 30% stenosed.  Mid RCA lesion is 10% stenosed.  Ost 1st Mrg lesion is 80% stenosed.  Previously placed Prox Cx stent (unknown type) is widely patent.  Ost 2nd Mrg lesion is 65% stenosed.  Ost LAD lesion is 100% stenosed.  Previously placed Origin to Prox Graft stent (unknown type) is widely patent.  The left ventricular ejection fraction is 55-65% by visual estimate.  LV end diastolic pressure is normal.  The left ventricular systolic function is normal.   Hyperdynamic LV function with a spade-like ventricle and ejection fraction at 60-65%.  Multi vessel native CAD with total occlusion of the very proximal LAD; 75% ostial stenosis of the OM1 vessel with a patent LCX stent between the OM1 and OM 2 vessels, and 65-70% ostial narrowing at the origin of the OM 2 vessel; dominant RCA with 30% proximal and 10% mid stenosis.  Widely patent free radial graft supplying the OM1 vessel with a patent ostial proximal stent in the graft.  Patent LIMA graft supplying the LAD system.  RECOMMENDATION: Medical therapy.  Echocardiogram 12/21/16 Study Conclusions  - Left ventricle: The cavity size was normal. There was moderate   concentric hypertrophy. Systolic function was normal. The   estimated ejection fraction was in the range of 60% to 65%. Wall   motion was normal; there were no regional wall motion   abnormalities. - Aortic valve: Severely calcified annulus. Trileaflet; mildly   thickened, mildly calcified leaflets. There was mild stenosis.   Valve area (VTI): 1.38 cm^2. Valve area (Vmax): 1.08 cm^2. Valve   area (Vmean): 1.43 cm^2. - Aorta: Aortic root dimension: 40 mm (ED). - Aortic root: The aortic root was mildly dilated. -  Mitral valve: Calcified annulus. There was mild  regurgitation. - Left atrium: The atrium was severely dilated. - Right atrium: The atrium was moderately dilated. - Tricuspid valve: There was mild regurgitation. - Pulmonary arteries: PA peak pressure: 32 mm Hg (S). - Pericardium, extracardiac: A trivial, free-flowing pericardial   effusion was identified posterior to the heart. The fluid had no   internal echoes.   EKG:  EKG is not ordered today.  The ekg done at the hospital on 12/02/2017 showed   Recent Labs: 01/17/2017: TSH 1.61 06/21/2017: Pro B Natriuretic peptide (BNP) 666.0 06/26/2017: Magnesium 2.0 06/27/2017: ALT 17 12/02/2017: B Natriuretic Peptide 629.1; BUN 19; Creatinine, Ser 1.20; Hemoglobin 12.7; Platelets 100; Potassium 4.0; Sodium 137   Recent Lipid Panel    Component Value Date/Time   CHOL 113 12/21/2016 0211   CHOL 97 09/09/2013 0957   TRIG 98 12/21/2016 0211   TRIG 92 09/09/2013 0957   HDL 28 (L) 12/21/2016 0211   HDL 37 (L) 09/09/2013 0957   CHOLHDL 4.0 12/21/2016 0211   VLDL 20 12/21/2016 0211   LDLCALC 65 12/21/2016 0211   LDLCALC 42 09/09/2013 0957    Physical Exam:    VS:  BP 120/80   Pulse 66   Ht 5\' 8"  (1.727 m)   Wt 174 lb (78.9 kg)   SpO2 99%   BMI 26.46 kg/m     Wt Readings from Last 3 Encounters:  12/06/17 174 lb (78.9 kg)  11/09/17 177 lb (80.3 kg)  10/26/17 167 lb (75.8 kg)     Physical Exam  Constitutional: He is oriented to person, place, and time. No distress.  Elderly male  HENT:  Head: Normocephalic and atraumatic.  Neck: Normal range of motion. Neck supple. No JVD present.  Cardiovascular: Normal rate, regular rhythm, normal heart sounds and intact distal pulses. Exam reveals no gallop and no friction rub.  No murmur heard. Pulmonary/Chest: Effort normal and breath sounds normal. No respiratory distress. He has no wheezes. He has no rales.  Abdominal: Soft. Bowel sounds are normal.  Musculoskeletal: Normal range of motion. He exhibits no edema.  Neurological: He is alert  and oriented to person, place, and time.  Skin: Skin is warm and dry. Rash noted.  Fine red rash over upper chest, itchy  Psychiatric: He has a normal mood and affect. His behavior is normal. Thought content normal.    ASSESSMENT:    1. Acute on chronic diastolic heart failure ([place])   2. Coronary artery disease involving native coronary artery of native heart without angina pectoris   3. Paroxysmal atrial fibrillation ([facility])   4. Essential hypertension    PLAN:    In order of problems listed above:  Acute on chronic diastolic dysfunction: Pt with recent DOE, orthopnea, seen in the ED. Started on lasix 40 mg daily with improvement. He still seems to have a little more volume to come off. Will continue lasix 40 mg daiy for 5 days then 20 mg daily with an extra dose for increase wt or dyspnea. I had an indepth discussion with the patient and his 2 daughters regarding volume status/heart failure management including daily wts and monitoring for increased dyspnea, orthopnea, and swelling -even if just in the abdomen and what to report to our office. We also discussed heart healthy diet and reducing salt intake to 2 grams/day. The patient is a heavy salt user.   CAD: No anginal symptoms. Continue BB, statin, no aspirin due to need for anticoagulation.  Hx afib: maintaining sinus rhythm with 1st degree AVB. On apixaban for stroke risk reduction.   Hypertension: BP is well controlled.   Hyperlipidemia: On rosuvastatin 5 mg daily.     Medication Adjustments/Labs and Tests Ordered: Current medicines are reviewed at length with the patient today.  Concerns regarding medicines are outlined above. Labs and tests ordered and medication changes are outlined in the patient instructions below:  Patient Instructions   Medication Instructions:  Your physician has recommended you make the following change in your medication:   1. TAKE LASIX 40 MG FOR 5 DAYS: THEN TAKE LASIX 20 MG DAILY, IF YOU  EXPERIENCE INCREASED SHORTNESS OF BREATH OR WEIGHT GAIN YOU MAY TAKE AN EXTRA 20 MG TABLET.     Labwork: None ordered  Testing/Procedures: None ordered  Follow-Up:  Your physician wants you to follow-up in: 1 month with Dr. Irish Lack or Daune Perch, NP  Any Other Special Instructions Will Be Listed Below (If Applicable).     If you need a refill on your cardiac medications before your next appointment, please call your pharmacy.  Heart Failure Action Plan A heart failure action plan helps you understand what to do when you have symptoms of heart failure. Follow the plan that was created by you and your health care provider. Review your plan each time you visit your health care provider. Red zone These signs and symptoms mean you should get medical help right away:  You have trouble breathing when resting.  You have a dry cough that is getting worse.  You have swelling or pain in your legs or abdomen that is getting worse.  You suddenly gain more than 2-3 lb (0.9-1.4 kg) in a day, or more than 5 lb (2.3 kg) in one week. This amount may be more or less depending on your condition.  You have trouble staying awake or you feel confused.  You have chest pain.  You do not have an appetite.  You pass out.  If you experience any of these symptoms:  Call your local emergency services (911 in the [place] right away or seek help at the emergency department of the nearest hospital.  Yellow zone These signs and symptoms mean your condition may be getting worse and you should make some changes:  You have trouble breathing when you are active or you need to sleep with extra pillows.  You have swelling in your legs or abdomen.  You gain 2-3 lb (0.9-1.4 kg) in one day, or 5 lb (2.3 kg) in one week. This amount may be more or less depending on your condition.  You get tired easily.  You have trouble sleeping.  You have a dry cough.  If you experience any of these  symptoms:  Contact your health care provider within the next day.  Your health care provider may adjust your medicines.  Green zone These signs mean you are doing well and can continue what you are doing:  You do not have shortness of breath.  You have very little swelling or no new swelling.  Your weight is stable (no gain or loss).  You have a normal activity level.  You do not have chest pain or any other new symptoms.  Follow these instructions at home:  Take over-the-counter and prescription medicines only as told by your health care provider.  Weigh yourself daily. Your target weight is __________ lb (__________ kg). ? Call your health care provider if you gain more than __________ lb (__________ kg)  in a day, or more than __________ lb (__________ kg) in one week.  Eat a heart-healthy diet. Work with a diet and nutrition specialist (dietitian) to create an eating plan that is best for you.  Keep all follow-up visits as told by your health care provider. This is important. Where to find more information:  American Heart Association: www.heart.org Summary  Follow the action plan that was created by you and your health care provider.  Get help right away if you have any symptoms in the Red zone. This information is not intended to replace advice given to you by your health care provider. Make sure you discuss any questions you have with your health care provider. Document Released: 04/30/2016 Document Revised: 04/30/2016 Document Reviewed: 04/30/2016 Elsevier Interactive Patient Education  2018 [place].    Two Gram Sodium Diet 2000 mg  What is Sodium? Sodium is a mineral found naturally in many foods. The most significant source of sodium in the diet is table salt, which is about 40% sodium.  Processed, convenience, and preserved foods also contain a large amount of sodium.  The body needs only 500 mg of sodium daily to function,  A normal diet provides more  than enough sodium even if you do not use salt.  Why Limit Sodium? A build up of sodium in the body can cause thirst, increased blood pressure, shortness of breath, and water retention.  Decreasing sodium in the diet can reduce edema and risk of heart attack or stroke associated with high blood pressure.  Keep in mind that there are many other factors involved in these health problems.  Heredity, obesity, lack of exercise, cigarette smoking, stress and what you eat all play a role.  General Guidelines:  Do not add salt at the table or in cooking.  One teaspoon of salt contains over 2 grams of sodium.  Read food labels  Avoid processed and convenience foods  Ask your dietitian before eating any foods not dicussed in the menu planning guidelines  Consult your physician if you wish to use a salt substitute or a sodium containing medication such as antacids.  Limit milk and milk products to 16 oz (2 cups) per day.  Shopping Hints:  READ LABELS!! "Dietetic" does not necessarily mean low sodium.  Salt and other sodium ingredients are often added to foods during processing.   Menu Planning Guidelines Food Group Choose More Often Avoid  Beverages (see also the milk group All fruit juices, low-sodium, salt-free vegetables juices, low-sodium carbonated beverages Regular vegetable or tomato juices, commercially softened water used for drinking or cooking  Breads and Cereals Enriched white, wheat, rye and pumpernickel bread, hard rolls and dinner rolls; muffins, cornbread and waffles; most dry cereals, cooked cereal without added salt; unsalted crackers and breadsticks; low sodium or homemade bread crumbs Bread, rolls and crackers with salted tops; quick breads; instant hot cereals; pancakes; commercial bread stuffing; self-rising flower and biscuit mixes; regular bread crumbs or cracker crumbs  Desserts and Sweets Desserts and sweets mad with mild should be within allowance Instant pudding mixes and  cake mixes  Fats Butter or margarine; vegetable oils; unsalted salad dressings, regular salad dressings limited to 1 Tbs; light, sour and heavy cream Regular salad dressings containing bacon fat, bacon bits, and salt pork; snack dips made with instant soup mixes or processed cheese; salted nuts  Fruits Most fresh, frozen and canned fruits Fruits processed with salt or sodium-containing ingredient (some dried fruits are processed with sodium sulfites  Vegetables Fresh, frozen vegetables and low- sodium canned vegetables Regular canned vegetables, sauerkraut, pickled vegetables, and others prepared in brine; frozen vegetables in sauces; vegetables seasoned with ham, bacon or salt pork  Condiments, Sauces, Miscellaneous  Salt substitute with physician's approval; pepper, herbs, spices; vinegar, lemon or lime juice; hot pepper sauce; garlic powder, onion powder, low sodium soy sauce (1 Tbs.); low sodium condiments (ketchup, chili sauce, mustard) in limited amounts (1 tsp.) fresh ground horseradish; unsalted tortilla chips, pretzels, potato chips, popcorn, salsa (1/4 cup) Any seasoning made with salt including garlic salt, celery salt, onion salt, and seasoned salt; sea salt, rock salt, kosher salt; meat tenderizers; monosodium glutamate; mustard, regular soy sauce, barbecue, sauce, chili sauce, teriyaki sauce, steak sauce, Worcestershire sauce, and most flavored vinegars; canned gravy and mixes; regular condiments; salted snack foods, olives, picles, relish, horseradish sauce, catsup   Food preparation: Try these seasonings Meats:    Pork Sage, onion Serve with applesauce  Chicken Poultry seasoning, thyme, parsley Serve with cranberry sauce  Lamb Curry powder, rosemary, garlic, thyme Serve with mint sauce or jelly  Veal Marjoram, basil Serve with current jelly, cranberry sauce  Beef Pepper, bay leaf Serve with dry mustard, unsalted chive butter  Fish Bay leaf, dill Serve with unsalted lemon  butter, unsalted parsley butter  Vegetables:    Asparagus Lemon juice   Broccoli Lemon juice   Carrots Mustard dressing parsley, mint, nutmeg, glazed with unsalted butter and sugar   Green beans Marjoram, lemon juice, nutmeg,dill seed   Tomatoes Basil, marjoram, onion   Spice /blend for [employer]" 4 tsp ground thyme 1 tsp ground sage 3 tsp ground rosemary 4 tsp ground marjoram   Test your knowledge 1. A product that says "Salt Free" may still contain sodium. True or False 2. Garlic Powder and Hot Pepper Sauce an be used as alternative seasonings.True or False 3. Processed foods have more sodium than fresh foods.  True or False 4. Canned Vegetables have less sodium than froze True or False  WAYS TO DECREASE YOUR SODIUM INTAKE 1. Avoid the use of added salt in cooking and at the table.  Table salt (and other prepared seasonings which contain salt) is probably one of the greatest sources of sodium in the diet.  Unsalted foods can gain flavor from the sweet, sour, and butter taste sensations of herbs and spices.  Instead of using salt for seasoning, try the following seasonings with the foods listed.  Remember: how you use them to enhance natural food flavors is limited only by your creativity... Allspice-Meat, fish, eggs, fruit, peas, red and yellow vegetables Almond Extract-Fruit baked goods Anise Seed-Sweet breads, fruit, carrots, beets, cottage cheese, cookies (tastes like licorice) Basil-Meat, fish, eggs, vegetables, rice, vegetables salads, soups, sauces Bay Leaf-Meat, fish, stews, poultry Burnet-Salad, vegetables (cucumber-like flavor) Caraway Seed-Bread, cookies, cottage cheese, meat, vegetables, cheese, rice Cardamon-Baked goods, fruit, soups Celery Powder or seed-Salads, salad dressings, sauces, meatloaf, soup, bread.Do not use  celery salt Chervil-Meats, salads, fish, eggs, vegetables, cottage cheese (parsley-like flavor) Chili Power-Meatloaf, chicken cheese, corn, eggplant,  egg dishes Chives-Salads cottage cheese, egg dishes, soups, vegetables, sauces Cilantro-Salsa, casseroles Cinnamon-Baked goods, fruit, pork, lamb, chicken, carrots Cloves-Fruit, baked goods, fish, pot roast, green beans, beets, carrots Coriander-Pastry, cookies, meat, salads, cheese (lemon-orange flavor) Cumin-Meatloaf, fish,cheese, eggs, cabbage,fruit pie (caraway flavor) [employer], fruit, eggs, fish, poultry, cottage cheese, vegetables Dill Seed-Meat, cottage cheese, poultry, vegetables, fish, salads, bread Fennel Seed-Bread, cookies, apples, pork, eggs, fish, beets, cabbage, cheese, Licorice-like flavor Garlic-(buds or powder)  Salads, meat, poultry, fish, bread, butter, vegetables, potatoes.Do not  use garlic salt Ginger-Fruit, vegetables, baked goods, meat, fish, poultry Horseradish Root-Meet, vegetables, butter Lemon Juice or Extract-Vegetables, fruit, tea, baked goods, fish salads Mace-Baked goods fruit, vegetables, fish, poultry (taste like nutmeg) Maple Extract-Syrups Marjoram-Meat, chicken, fish, vegetables, breads, green salads (taste like Sage) Mint-Tea, lamb, sherbet, vegetables, desserts, carrots, cabbage Mustard, Dry or Seed-Cheese, eggs, meats, vegetables, poultry Nutmeg-Baked goods, fruit, chicken, eggs, vegetables, desserts Onion Powder-Meat, fish, poultry, vegetables, cheese, eggs, bread, rice salads (Do not use   Onion salt) Orange Extract-Desserts, baked goods Oregano-Pasta, eggs, cheese, onions, pork, lamb, fish, chicken, vegetables, green salads Paprika-Meat, fish, poultry, eggs, cheese, vegetables Parsley Flakes-Butter, vegetables, meat fish, poultry, eggs, bread, salads (certain forms may   Contain sodium Pepper-Meat fish, poultry, vegetables, eggs Peppermint Extract-Desserts, baked goods Poppy Seed-Eggs, bread, cheese, fruit dressings, baked goods, noodles, vegetables, cottage  [employer], poultry, meat, fish,  cauliflower, turnips,eggs bread Saffron-Rice, bread, veal, chicken, fish, eggs Sage-Meat, fish, poultry, onions, eggplant, tomateos, pork, stews Savory-Eggs, salads, poultry, meat, rice, vegetables, soups, pork Tarragon-Meat, poultry, fish, eggs, butter, vegetables (licorice-like flavor)  Thyme-Meat, poultry, fish, eggs, vegetables, (clover-like flavor), sauces, soups Tumeric-Salads, butter, eggs, fish, rice, vegetables (saffron-like flavor) Vanilla Extract-Baked goods, candy Vinegar-Salads, vegetables, meat marinades Walnut Extract-baked goods, candy  2. Choose your Foods Wisely   The following is a list of foods to avoid which are high in sodium:  Meats-Avoid all smoked, canned, salt cured, dried and kosher meat and fish as well as Anchovies   [employer] Rx meats:Bologna, Liverwurst, Pastrami Canned meat or fish  Marinated herring Caviar    Pepperoni Corned Beef   Pizza Dried chipped beef  Salami Frozen breaded fish or meat Salt pork Frankfurters or hot dogs  Sardines Gefilte fish   Sausage Ham (boiled ham, Proscuitto Smoked butt    spiced ham)   Spam      TV Dinners Vegetables Canned vegetables (Regular) Relish Canned mushrooms  Sauerkraut Olives    Tomato juice Pickles  Bakery and Dessert Products Canned puddings  Cream pies Cheesecake   Decorated cakes Cookies  Beverages/Juices Tomato juice, regular  Gatorade   V-8 vegetable juice, regular  Breads and Cereals Biscuit mixes   Salted potato chips, corn chips, pretzels Bread stuffing mixes  Salted crackers and rolls Pancake and waffle mixes Self-rising flour  Seasonings Accent    Meat sauces Barbecue sauce  Meat tenderizer Catsup    Monosodium glutamate (MSG) Celery salt   Onion salt Chili sauce   Prepared mustard Garlic salt   Salt, seasoned salt, sea salt Gravy mixes   Soy sauce Horseradish   Steak sauce Ketchup   Tartar sauce Lite salt    Teriyaki sauce Marinade mixes   Worcestershire  sauce  Others Baking powder   Cocoa and cocoa mixes Baking soda   Commercial casserole mixes Candy-caramels, chocolate  Dehydrated soups    Bars, fudge,nougats  Instant rice and pasta mixes Canned broth or soup  Maraschino cherries Cheese, aged and processed cheese and cheese spreads  Learning Assessment Quiz  Indicated T (for True) or F (for False) for each of the following statements:  1. _____ Fresh fruits and vegetables and unprocessed grains are generally low in sodium 2. _____ Water may contain a considerable amount of sodium, depending on the source 3. _____ You can always tell if a food is high in sodium by tasting it 4. _____ Certain laxatives my be high in sodium and  should be avoided unless prescribed   by a physician or pharmacist 5. _____ Salt substitutes may be used freely by anyone on a sodium restricted diet 6. _____ Sodium is present in table salt, food additives and as a natural component of   most foods 7. _____ Table salt is approximately 90% sodium 8. _____ Limiting sodium intake may help prevent excess fluid accumulation in the body 9. _____ On a sodium-restricted diet, seasonings such as bouillon soy sauce, and    cooking wine should be used in place of table salt 10. _____ On an ingredient list, a product which lists monosodium glutamate as the first   ingredient is an appropriate food to include on a low sodium diet  Circle the best answer(s) to the following statements (Hint: there may be more than one correct answer)  11. On a low-sodium diet, some acceptable snack items are:    A. Olives  F. Bean dip   K. Grapefruit juice    B. Salted Pretzels G. Commercial Popcorn   L. Canned peaches    C. Carrot Sticks  H. Bouillon   M. Unsalted nuts   D. [place] fries  I. Peanut butter crackers N. Salami   E. Sweet pickles J. Tomato Juice   O. Pizza  12.  Seasonings that may be used freely on a reduced - sodium diet include   A. Lemon wedges F.Monosodium  glutamate K. Celery seed    B.Soysauce   G. Pepper   L. Mustard powder   C. Sea salt  H. Cooking wine  M. Onion flakes   D. Vinegar  E. Prepared horseradish N. Salsa   E. Sage   J. Worcestershire sauce  O. Chutney     Signed, Daune Perch, NP  12/06/2017 7:04 PM    [facility]

## 2017-12-06 NOTE — Patient Instructions (Addendum)
Medication Instructions:  Your physician has recommended you make the following change in your medication:   1. TAKE LASIX 40 MG FOR 5 DAYS: THEN TAKE LASIX 20 MG DAILY, IF YOU EXPERIENCE INCREASED SHORTNESS OF BREATH OR WEIGHT GAIN YOU MAY TAKE AN EXTRA 20 MG TABLET.     Labwork: None ordered  Testing/Procedures: None ordered  Follow-Up:  Your physician wants you to follow-up in: 1 month with Dr. Irish Lack or Daune Perch, NP  Any Other Special Instructions Will Be Listed Below (If Applicable).     If you need a refill on your cardiac medications before your next appointment, please call your pharmacy.  Heart Failure Action Plan A heart failure action plan helps you understand what to do when you have symptoms of heart failure. Follow the plan that was created by you and your health care provider. Review your plan each time you visit your health care provider. Red zone These signs and symptoms mean you should get medical help right away:  You have trouble breathing when resting.  You have a dry cough that is getting worse.  You have swelling or pain in your legs or abdomen that is getting worse.  You suddenly gain more than 2-3 lb (0.9-1.4 kg) in a day, or more than 5 lb (2.3 kg) in one week. This amount may be more or less depending on your condition.  You have trouble staying awake or you feel confused.  You have chest pain.  You do not have an appetite.  You pass out.  If you experience any of these symptoms:  Call your local emergency services (911 in the [place] right away or seek help at the emergency department of the nearest hospital.  Yellow zone These signs and symptoms mean your condition may be getting worse and you should make some changes:  You have trouble breathing when you are active or you need to sleep with extra pillows.  You have swelling in your legs or abdomen.  You gain 2-3 lb (0.9-1.4 kg) in one day, or 5 lb (2.3 kg) in one week. This  amount may be more or less depending on your condition.  You get tired easily.  You have trouble sleeping.  You have a dry cough.  If you experience any of these symptoms:  Contact your health care provider within the next day.  Your health care provider may adjust your medicines.  Green zone These signs mean you are doing well and can continue what you are doing:  You do not have shortness of breath.  You have very little swelling or no new swelling.  Your weight is stable (no gain or loss).  You have a normal activity level.  You do not have chest pain or any other new symptoms.  Follow these instructions at home:  Take over-the-counter and prescription medicines only as told by your health care provider.  Weigh yourself daily. Your target weight is __________ lb (__________ kg). ? Call your health care provider if you gain more than __________ lb (__________ kg) in a day, or more than __________ lb (__________ kg) in one week.  Eat a heart-healthy diet. Work with a diet and nutrition specialist (dietitian) to create an eating plan that is best for you.  Keep all follow-up visits as told by your health care provider. This is important. Where to find more information:  American Heart Association: www.heart.org Summary  Follow the action plan that was created by you and your health care  provider.  Get help right away if you have any symptoms in the Red zone. This information is not intended to replace advice given to you by your health care provider. Make sure you discuss any questions you have with your health care provider. Document Released: 04/30/2016 Document Revised: 04/30/2016 Document Reviewed: 04/30/2016 Elsevier Interactive Patient Education  2018 [place].    Two Gram Sodium Diet 2000 mg  What is Sodium? Sodium is a mineral found naturally in many foods. The most significant source of sodium in the diet is table salt, which is about 40% sodium.   Processed, convenience, and preserved foods also contain a large amount of sodium.  The body needs only 500 mg of sodium daily to function,  A normal diet provides more than enough sodium even if you do not use salt.  Why Limit Sodium? A build up of sodium in the body can cause thirst, increased blood pressure, shortness of breath, and water retention.  Decreasing sodium in the diet can reduce edema and risk of heart attack or stroke associated with high blood pressure.  Keep in mind that there are many other factors involved in these health problems.  Heredity, obesity, lack of exercise, cigarette smoking, stress and what you eat all play a role.  General Guidelines:  Do not add salt at the table or in cooking.  One teaspoon of salt contains over 2 grams of sodium.  Read food labels  Avoid processed and convenience foods  Ask your dietitian before eating any foods not dicussed in the menu planning guidelines  Consult your physician if you wish to use a salt substitute or a sodium containing medication such as antacids.  Limit milk and milk products to 16 oz (2 cups) per day.  Shopping Hints:  READ LABELS!! "Dietetic" does not necessarily mean low sodium.  Salt and other sodium ingredients are often added to foods during processing.   Menu Planning Guidelines Food Group Choose More Often Avoid  Beverages (see also the milk group All fruit juices, low-sodium, salt-free vegetables juices, low-sodium carbonated beverages Regular vegetable or tomato juices, commercially softened water used for drinking or cooking  Breads and Cereals Enriched white, wheat, rye and pumpernickel bread, hard rolls and dinner rolls; muffins, cornbread and waffles; most dry cereals, cooked cereal without added salt; unsalted crackers and breadsticks; low sodium or homemade bread crumbs Bread, rolls and crackers with salted tops; quick breads; instant hot cereals; pancakes; commercial bread stuffing; self-rising  flower and biscuit mixes; regular bread crumbs or cracker crumbs  Desserts and Sweets Desserts and sweets mad with mild should be within allowance Instant pudding mixes and cake mixes  Fats Butter or margarine; vegetable oils; unsalted salad dressings, regular salad dressings limited to 1 Tbs; light, sour and heavy cream Regular salad dressings containing bacon fat, bacon bits, and salt pork; snack dips made with instant soup mixes or processed cheese; salted nuts  Fruits Most fresh, frozen and canned fruits Fruits processed with salt or sodium-containing ingredient (some dried fruits are processed with sodium sulfites        Vegetables Fresh, frozen vegetables and low- sodium canned vegetables Regular canned vegetables, sauerkraut, pickled vegetables, and others prepared in brine; frozen vegetables in sauces; vegetables seasoned with ham, bacon or salt pork  Condiments, Sauces, Miscellaneous  Salt substitute with physician's approval; pepper, herbs, spices; vinegar, lemon or lime juice; hot pepper sauce; garlic powder, onion powder, low sodium soy sauce (1 Tbs.); low sodium condiments (ketchup, chili sauce, mustard)  in limited amounts (1 tsp.) fresh ground horseradish; unsalted tortilla chips, pretzels, potato chips, popcorn, salsa (1/4 cup) Any seasoning made with salt including garlic salt, celery salt, onion salt, and seasoned salt; sea salt, rock salt, kosher salt; meat tenderizers; monosodium glutamate; mustard, regular soy sauce, barbecue, sauce, chili sauce, teriyaki sauce, steak sauce, Worcestershire sauce, and most flavored vinegars; canned gravy and mixes; regular condiments; salted snack foods, olives, picles, relish, horseradish sauce, catsup   Food preparation: Try these seasonings Meats:    Pork Sage, onion Serve with applesauce  Chicken Poultry seasoning, thyme, parsley Serve with cranberry sauce  Lamb Curry powder, rosemary, garlic, thyme Serve with mint sauce or jelly  Veal  Marjoram, basil Serve with current jelly, cranberry sauce  Beef Pepper, bay leaf Serve with dry mustard, unsalted chive butter  Fish [place] leaf, dill Serve with unsalted lemon butter, unsalted parsley butter  Vegetables:    Asparagus Lemon juice   Broccoli Lemon juice   Carrots Mustard dressing parsley, mint, nutmeg, glazed with unsalted butter and sugar   Green beans Marjoram, lemon juice, nutmeg,dill seed   Tomatoes Basil, marjoram, onion   Spice /blend for [employer]" 4 tsp ground thyme 1 tsp ground sage 3 tsp ground rosemary 4 tsp ground marjoram   Test your knowledge 1. A product that says "Salt Free" may still contain sodium. True or False 2. Garlic Powder and Hot Pepper Sauce an be used as alternative seasonings.True or False 3. Processed foods have more sodium than fresh foods.  True or False 4. Canned Vegetables have less sodium than froze True or False  WAYS TO DECREASE YOUR SODIUM INTAKE 1. Avoid the use of added salt in cooking and at the table.  Table salt (and other prepared seasonings which contain salt) is probably one of the greatest sources of sodium in the diet.  Unsalted foods can gain flavor from the sweet, sour, and butter taste sensations of herbs and spices.  Instead of using salt for seasoning, try the following seasonings with the foods listed.  Remember: how you use them to enhance natural food flavors is limited only by your creativity... Allspice-Meat, fish, eggs, fruit, peas, red and yellow vegetables Almond Extract-Fruit baked goods Anise Seed-Sweet breads, fruit, carrots, beets, cottage cheese, cookies (tastes like licorice) Basil-Meat, fish, eggs, vegetables, rice, vegetables salads, soups, sauces Bay Leaf-Meat, fish, stews, poultry Burnet-Salad, vegetables (cucumber-like flavor) Caraway Seed-Bread, cookies, cottage cheese, meat, vegetables, cheese, rice Cardamon-Baked goods, fruit, soups Celery Powder or seed-Salads, salad dressings, sauces, meatloaf,  soup, bread.Do not use  celery salt Chervil-Meats, salads, fish, eggs, vegetables, cottage cheese (parsley-like flavor) Chili Power-Meatloaf, chicken cheese, corn, eggplant, egg dishes Chives-Salads cottage cheese, egg dishes, soups, vegetables, sauces Cilantro-Salsa, casseroles Cinnamon-Baked goods, fruit, pork, lamb, chicken, carrots Cloves-Fruit, baked goods, fish, pot roast, green beans, beets, carrots Coriander-Pastry, cookies, meat, salads, cheese (lemon-orange flavor) Cumin-Meatloaf, fish,cheese, eggs, cabbage,fruit pie (caraway flavor) [employer], fruit, eggs, fish, poultry, cottage cheese, vegetables Dill Seed-Meat, cottage cheese, poultry, vegetables, fish, salads, bread Fennel Seed-Bread, cookies, apples, pork, eggs, fish, beets, cabbage, cheese, Licorice-like flavor Garlic-(buds or powder) Salads, meat, poultry, fish, bread, butter, vegetables, potatoes.Do not  use garlic salt Ginger-Fruit, vegetables, baked goods, meat, fish, poultry Horseradish Root-Meet, vegetables, butter Lemon Juice or Extract-Vegetables, fruit, tea, baked goods, fish salads Mace-Baked goods fruit, vegetables, fish, poultry (taste like nutmeg) Maple Extract-Syrups Marjoram-Meat, chicken, fish, vegetables, breads, green salads (taste like Sage) Mint-Tea, lamb, sherbet, vegetables, desserts, carrots, cabbage Mustard, Dry or Seed-Cheese, eggs, meats, vegetables, poultry  Nutmeg-Baked goods, fruit, chicken, eggs, vegetables, desserts Onion Powder-Meat, fish, poultry, vegetables, cheese, eggs, bread, rice salads (Do not use   Onion salt) Orange Extract-Desserts, baked goods Oregano-Pasta, eggs, cheese, onions, pork, lamb, fish, chicken, vegetables, green salads Paprika-Meat, fish, poultry, eggs, cheese, vegetables Parsley Flakes-Butter, vegetables, meat fish, poultry, eggs, bread, salads (certain forms may   Contain sodium Pepper-Meat fish, poultry, vegetables, eggs Peppermint Extract-Desserts, baked  goods Poppy Seed-Eggs, bread, cheese, fruit dressings, baked goods, noodles, vegetables, cottage  [employer], poultry, meat, fish, cauliflower, turnips,eggs bread Saffron-Rice, bread, veal, chicken, fish, eggs Sage-Meat, fish, poultry, onions, eggplant, tomateos, pork, stews Savory-Eggs, salads, poultry, meat, rice, vegetables, soups, pork Tarragon-Meat, poultry, fish, eggs, butter, vegetables (licorice-like flavor)  Thyme-Meat, poultry, fish, eggs, vegetables, (clover-like flavor), sauces, soups Tumeric-Salads, butter, eggs, fish, rice, vegetables (saffron-like flavor) Vanilla Extract-Baked goods, candy Vinegar-Salads, vegetables, meat marinades Walnut Extract-baked goods, candy  2. Choose your Foods Wisely   The following is a list of foods to avoid which are high in sodium:  Meats-Avoid all smoked, canned, salt cured, dried and kosher meat and fish as well as Anchovies   [employer] Rx meats:Bologna, Liverwurst, Pastrami Canned meat or fish  Marinated herring Caviar    Pepperoni Corned Beef   Pizza Dried chipped beef  Salami Frozen breaded fish or meat Salt pork Frankfurters or hot dogs  Sardines Gefilte fish   Sausage Ham (boiled ham, Proscuitto Smoked butt    spiced ham)   Spam      TV Dinners Vegetables Canned vegetables (Regular) Relish Canned mushrooms  Sauerkraut Olives    Tomato juice Pickles  Bakery and Dessert Products Canned puddings  Cream pies Cheesecake   Decorated cakes Cookies  Beverages/Juices Tomato juice, regular  Gatorade   V-8 vegetable juice, regular  Breads and Cereals Biscuit mixes   Salted potato chips, corn chips, pretzels Bread stuffing mixes  Salted crackers and rolls Pancake and waffle mixes Self-rising flour  Seasonings Accent    Meat sauces Barbecue sauce  Meat tenderizer Catsup    Monosodium glutamate (MSG) Celery salt   Onion salt Chili sauce   Prepared mustard Garlic  salt   Salt, seasoned salt, sea salt Gravy mixes   Soy sauce Horseradish   Steak sauce Ketchup   Tartar sauce Lite salt    Teriyaki sauce Marinade mixes   Worcestershire sauce  Others Baking powder   Cocoa and cocoa mixes Baking soda   Commercial casserole mixes Candy-caramels, chocolate  Dehydrated soups    Bars, fudge,nougats  Instant rice and pasta mixes Canned broth or soup  Maraschino cherries Cheese, aged and processed cheese and cheese spreads  Learning Assessment Quiz  Indicated T (for True) or F (for False) for each of the following statements:  1. _____ Fresh fruits and vegetables and unprocessed grains are generally low in sodium 2. _____ Water may contain a considerable amount of sodium, depending on the source 3. _____ You can always tell if a food is high in sodium by tasting it 4. _____ Certain laxatives my be high in sodium and should be avoided unless prescribed   by a physician or pharmacist 5. _____ Salt substitutes may be used freely by anyone on a sodium restricted diet 6. _____ Sodium is present in table salt, food additives and as a natural component of   most foods 7. _____ Table salt is approximately 90% sodium 8. _____ Limiting sodium intake may help prevent excess fluid accumulation in the body 9.  _____ On a sodium-restricted diet, seasonings such as bouillon soy sauce, and    cooking wine should be used in place of table salt 10. _____ On an ingredient list, a product which lists monosodium glutamate as the first   ingredient is an appropriate food to include on a low sodium diet  Circle the best answer(s) to the following statements (Hint: there may be more than one correct answer)  11. On a low-sodium diet, some acceptable snack items are:    A. Olives  F. Bean dip   K. Grapefruit juice    B. Salted Pretzels G. Commercial Popcorn   L. Canned peaches    C. Carrot Sticks  H. Bouillon   M. Unsalted nuts   D. [place] fries  I. Peanut butter crackers N.  Salami   E. Sweet pickles J. Tomato Juice   O. Pizza  12.  Seasonings that may be used freely on a reduced - sodium diet include   A. Lemon wedges F.Monosodium glutamate K. Celery seed    B.Soysauce   G. Pepper   L. Mustard powder   C. Sea salt  H. Cooking wine  M. Onion flakes   D. Vinegar  E. Prepared horseradish N. Salsa   E. Sage   J. Worcestershire sauce  O. Chutney

## 2017-12-06 NOTE — Telephone Encounter (Signed)
[name] called and states that pt used all of the cream for the fungus and it got better but has return, it never went completely away and wanted to know if he could get a refill on the cream. OK to refill?

## 2017-12-07 NOTE — Telephone Encounter (Signed)
We could try the lotrisone once again for 10 days but repeated use of steroids for long periods of time is not recommended.  He needs to use goldbond powder to keep that area dry after the 10 days.

## 2017-12-07 NOTE — Telephone Encounter (Signed)
Patient's daughter aware of results and of providers recommendations

## 2017-12-26 ENCOUNTER — Telehealth: Payer: Self-pay | Admitting: *Deleted

## 2017-12-26 NOTE — Telephone Encounter (Signed)
CALLED PATIENT TO INFORM OF CT FOR 01-04-18 - ARRIVAL TIME - 8:45 AM @ [facility], PT. TO HAVE WATER ONLY - 4 HRS. PRIOR TO TEST, PT. TO GET RESULTS ON 01-05-18 @ 9:30 AM WITH ASHLYN BRUNING, SPOKE WITH PATIENT'S DAUGHTER [name] JO [name] AND SHE  IS AWARE OF THESE APPTS.

## 2017-12-27 ENCOUNTER — Ambulatory Visit
Admission: RE | Admit: 2017-12-27 | Discharge: 2017-12-27 | Disposition: A | Discharge status: Home / Self Care | Payer: Medicare HMO | Source: Ambulatory Visit | Attending: Urology | Admitting: Urology

## 2018-01-03 NOTE — Progress Notes (Signed)
Evan [name]. 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 Appetite :Good most of the time. Pain:None Having fatigue today BP 101/67 (BP Location: Right Arm, Patient Position: Sitting)   Pulse 66   Temp 97.6 F (36.4 C) (Oral)   Resp 20   Ht 5\' 8"  (1.727 m)   Wt 178 lb (80.7 kg)   SpO2 97%   BMI 27.06 kg/m

## 2018-01-04 ENCOUNTER — Ambulatory Visit (HOSPITAL_COMMUNITY)
Admission: RE | Admit: 2018-01-04 | Discharge: 2018-01-04 | Disposition: A | Discharge status: Home / Self Care | Payer: Medicare HMO | Source: Ambulatory Visit | Attending: Urology | Admitting: Urology

## 2018-01-04 ENCOUNTER — Other Ambulatory Visit: Payer: Self-pay | Admitting: Family Medicine

## 2018-01-04 DIAGNOSIS — R0602 Shortness of breath: Secondary | ICD-10-CM | POA: Diagnosis not present

## 2018-01-04 DIAGNOSIS — C3490 Malignant neoplasm of unspecified part of unspecified bronchus or lung: Secondary | ICD-10-CM | POA: Diagnosis not present

## 2018-01-04 DIAGNOSIS — R269 Unspecified abnormalities of gait and mobility: Secondary | ICD-10-CM | POA: Diagnosis not present

## 2018-01-04 DIAGNOSIS — R296 Repeated falls: Secondary | ICD-10-CM | POA: Diagnosis not present

## 2018-01-04 DIAGNOSIS — C3491 Malignant neoplasm of unspecified part of right bronchus or lung: Secondary | ICD-10-CM | POA: Diagnosis not present

## 2018-01-04 DIAGNOSIS — J4520 Mild intermittent asthma, uncomplicated: Secondary | ICD-10-CM | POA: Diagnosis not present

## 2018-01-04 DIAGNOSIS — I700 Atherosclerosis of aorta: Secondary | ICD-10-CM | POA: Diagnosis not present

## 2018-01-04 DIAGNOSIS — J449 Chronic obstructive pulmonary disease, unspecified: Secondary | ICD-10-CM | POA: Diagnosis not present

## 2018-01-04 MED ORDER — IOHEXOL 300 MG/ML  SOLN
75.0000 mL | Freq: Once | INTRAMUSCULAR | Status: AC | PRN
  Administered 2018-01-04: 75 mL via INTRAVENOUS

## 2018-01-04 NOTE — Telephone Encounter (Signed)
Ok to refill??  Last office visit 11/09/2017.  Last refill 11/24/2017.

## 2018-01-05 ENCOUNTER — Ambulatory Visit
Admission: RE | Admit: 2018-01-05 | Discharge: 2018-01-05 | Disposition: A | Discharge status: Home / Self Care | Payer: Medicare HMO | Source: Ambulatory Visit | Attending: Urology | Admitting: Urology

## 2018-01-05 ENCOUNTER — Encounter: Payer: Self-pay | Admitting: Urology

## 2018-01-05 ENCOUNTER — Other Ambulatory Visit: Payer: Self-pay

## 2018-01-05 VITALS — BP 101/67 | HR 66 | Temp 97.6°F | Resp 20 | Ht 68.0 in | Wt 178.0 lb

## 2018-01-05 DIAGNOSIS — C3491 Malignant neoplasm of unspecified part of right bronchus or lung: Secondary | ICD-10-CM

## 2018-01-15 NOTE — Progress Notes (Signed)
Cardiology Office Note   Date:  01/15/2018   ID:  Evan [name], DOB 1927-08-22, MRN [id number]  PCP:  [name], MD    No chief complaint on file.  CAD  Wt Readings from Last 3 Encounters:  01/05/18 178 lb (80.7 kg)  12/06/17 174 lb (78.9 kg)  11/09/17 177 lb (80.3 kg)       History of Present Illness: Evan [name] is a 84 y.o. male  with history of CAD status post CABG x2 in 1998 and cath in 2009 showed patent stents and grafts.Low risk nuclear stress test 2017 he also has history of lung cancer status post radiation therapy.  Hospitalization 12/2016 with pericarditis. 2D echo showed normal LVEF with mild AS, no pericardial effusion.He was noted to be in atrial fibrillation with spontaneous conversion to normal sinus rhythm. He was placed on high-dose aspirin and colchicine. He was not started on NOAC'sbecause of the need to be on high-dose aspirin with pericarditis. Plavix was stopped.He was still having chest painon10/30/18. He was told he could use NSAIDs in the morning with breakfast every couple of days.He was back in atrial fibrillation rate controlled. He was started on Eliquis 5 mg twice daily for CHA2DS2-VASc score of 4.  In 2018, 48-hour Holter monitor was ordered which showed adequate rate control.  He was seen by pulmonary and they were concerned about [place] in 3/19  H ewas admitted and had cath showing: Hyperdynamic LV function with a spade-like ventricle and ejection fraction at 60-65%.  Multi vessel native CAD with total occlusion of the very proximal LAD; 75% ostial stenosis of the OM1 vessel with a patent LCX stent between the OM1 and OM 2 vessels, and 65-70% ostial narrowing at the origin of the OM 2 vessel; dominant RCA with 30% proximal and 10% mid stenosis.  Widely patent free radial graft supplying the OM1 vessel with a patent ostial proximal stent in the graft.  Patent LIMA graft supplying the LAD  system.  RECOMMENDATION: Medical therapy.  Being treated for right lung cancer with radiation.  He was started on lasix.  He does weight himself.  He does enjoy his salt.  He says "if he is going to die, he would like to be happy."    Past Medical History:  Diagnosis Date  . Adenocarcinoma of right lung ([place]) 01/06/2016  . Anginal chest pain at rest [facility])    Chronic non, controlled on Lorazepam  . Anxiety   . Arthritis    "all over"   . BPH (benign prostatic hyperplasia)   . CAD (coronary artery disease)   . Chronic lower back pain   . Complication of anesthesia    "problems making water afterwards"  . Depression   . DJD (degenerative joint disease)   . Esophageal ulcer without bleeding    bid ppi indefinitely  . Family history of adverse reaction to anesthesia    "children w/PONV"  . GERD (gastroesophageal reflux disease)   . Headache    "couple/week maybe" (09/04/2015)  . Hemochromatosis    Possible, elevated iron stores, hook-like osteophytes on hand films, normal LFTs  . Hepatitis    "yellow jaundice as a baby"  . History of ASCVD    MULTIVESSEL  . Hyperlipidemia   . Hypertension   . Myocardial infarction ([place]) 1999  . Osteoarthritis   . Pneumonia    "several times; got a little now" (09/04/2015)  . Rheumatoid arthritis ([place])   . Subdural hematoma ([facility])  small after fall 09/2016-plavix held, neurosurgery consulted.  Asymptomatic.      Past Surgical History:  Procedure Laterality Date  . ARM SKIN LESION BIOPSY / EXCISION Left 09/02/2015  . CATARACT EXTRACTION W/ INTRAOCULAR LENS  IMPLANT, BILATERAL Bilateral   . CHOLECYSTECTOMY OPEN    . CORNEAL TRANSPLANT Bilateral    "one at [facility]; one at [facility]"  . CORONARY ANGIOPLASTY WITH STENT PLACEMENT    . CORONARY ARTERY BYPASS GRAFT  1999  . DESCENDING AORTIC ANEURYSM REPAIR W/ STENT     DE stent ostium into the right radial free graft at OM-, 02-2007  . ESOPHAGOGASTRODUODENOSCOPY N/A 11/09/2014   Procedure:  ESOPHAGOGASTRODUODENOSCOPY (EGD);  Surgeon: [name], MD;  Location: [facility] ENDOSCOPY;  Service: Endoscopy;  Laterality: N/A;  . INGUINAL HERNIA REPAIR    . JOINT REPLACEMENT    . LEFT HEART CATH AND CORS/GRAFTS ANGIOGRAPHY N/A 06/27/2017   Procedure: LEFT HEART CATH AND CORS/GRAFTS ANGIOGRAPHY;  Surgeon: [name], MD;  Location: [place] CV LAB;  Service: Cardiovascular;  Laterality: N/A;  . NASAL SINUS SURGERY    . SHOULDER OPEN ROTATOR CUFF REPAIR Right   . TOTAL KNEE ARTHROPLASTY Bilateral      Current Outpatient Medications  Medication Sig Dispense Refill  . apixaban (ELIQUIS) 5 MG TABS tablet Take 1 tablet (5 mg total) by mouth 2 (two) times daily. 60 tablet 11  . Cholecalciferol 2000 units TABS Take 1 tablet (2,000 Units total) by mouth daily. 30 each 11  . clotrimazole-betamethasone (LOTRISONE) cream APPLY TO AFFECTED AREA TWICE A DAY (Patient not taking: Reported on 01/05/2018) 30 g 0  . ENSURE (ENSURE) Take 237 mLs by mouth 2 (two) times daily between meals.    Marland Kitchen FLUoxetine (PROZAC) 20 MG capsule TAKE 20 mg  BY MOUTH DAILY 90 capsule 1  . furosemide (LASIX) 20 MG tablet Take 1 tablet (20 mg total) by mouth daily. 90 tablet 3  . ipratropium-albuterol (DUONEB) 0.5-2.5 (3) MG/3ML SOLN Take 3 mLs by nebulization every 4 (four) hours as needed (or Chest Pain / Tightness). 360 mL 3  . leflunomide (ARAVA) 10 MG tablet Take 10 mg by mouth daily.    Marland Kitchen Lifitegrast (XIIDRA) 5 % SOLN Place 1-2 drops into both eyes daily.    Marland Kitchen LORazepam (ATIVAN) 1 MG tablet TAKE ONE TABLET BY MOUTH EVERY EIGHT HOURS AS NEEDED FOR ANXIETY 90 tablet 0  . metoprolol succinate (TOPROL-XL) 25 MG 24 hr tablet TAKE ONE (1) TABLET BY MOUTH EVERY DAY (Patient taking differently: Take 25 mg by mouth daily. ) 30 tablet 10  . nitroGLYCERIN (NITROSTAT) 0.4 MG SL tablet Place 1 tablet (0.4 mg total) under the tongue every 5 (five) minutes as needed for chest pain. 25 tablet 2  . pantoprazole (PROTONIX) 40 MG tablet  Take one (1) tablet (40 mg) by mouth daily. 90 tablet 3  . rosuvastatin (CRESTOR) 5 MG tablet TAKE 5 mg  EVERY DAY (Patient taking differently: Take 5 mg by mouth at bedtime. ) 90 tablet 3  . tamsulosin (FLOMAX) 0.4 MG CAPS capsule TAKE 1 CAPSULE EVERY DAY (Patient taking differently: Take 0.4 mg by mouth daily. ) 90 capsule 3   No current facility-administered medications for this visit.     Allergies:   Feldene [piroxicam]; Statins; Doxycycline; Latex; Tequin; and Valium    Social History:  The patient  reports that he has never smoked. He has quit using smokeless tobacco.  His smokeless tobacco use included chew. He reports that he does  not drink alcohol or use drugs.   Family History:  The patient's family history includes Arthritis-Osteo in his sister; Heart attack in his brother and other.    ROS:  Please see the history of present illness.   Otherwise, review of systems are positive for leg weakness.   All other systems are reviewed and negative.    PHYSICAL EXAM: VS:  There were no vitals taken for this visit. , BMI There is no height or weight on file to calculate BMI. GEN: Well nourished, well developed, in no acute distress  HEENT: normal  Neck: no JVD, carotid bruits, or masses Cardiac: RRR; no murmurs, rubs, or gallops,no edema  Respiratory:  clear to auscultation bilaterally, normal work of breathing GI: soft, nontender, nondistended, + BS MS: no deformity or atrophy  Skin: warm and dry, no rash Neuro:  Strength and sensation are intact Psych: euthymic mood, full affect    Recent Labs: 01/17/2017: TSH 1.61 06/21/2017: Pro B Natriuretic peptide (BNP) 666.0 06/26/2017: Magnesium 2.0 06/27/2017: ALT 17 12/02/2017: B Natriuretic Peptide 629.1; BUN 19; Creatinine, Ser 1.20; Hemoglobin 12.7; Platelets 100; Potassium 4.0; Sodium 137   Lipid Panel    Component Value Date/Time   CHOL 113 12/21/2016 0211   CHOL 97 09/09/2013 0957   TRIG 98 12/21/2016 0211   TRIG 92  09/09/2013 0957   HDL 28 (L) 12/21/2016 0211   HDL 37 (L) 09/09/2013 0957   CHOLHDL 4.0 12/21/2016 0211   VLDL 20 12/21/2016 0211   LDLCALC 65 12/21/2016 0211   LDLCALC 42 09/09/2013 0957     Other studies Reviewed: Additional studies/ records that were reviewed today with results demonstrating: cath records.   ASSESSMENT AND PLAN:  1. CAD: Patent grafts in 3/19 at the time of cath. Aggressive secondary prevention.  2. Pericarditis: Continue atypical chest pain.  3. AFib/flutter: Eliquis for stroke prevention.  4. Lung cancer: Getting radiation.  5. Chronic diastolic heart failure: Low sodium diet.  Can take 20-40 mg of Lasix daily, based on weight.     Current medicines are reviewed at length with the patient today.  The patient concerns regarding his medicines were addressed.  The following changes have been made:  Lasix as above  Labs/ tests ordered today include:  No orders of the defined types were placed in this encounter.   Recommend 150 minutes/week of aerobic exercise Low fat, low carb, high fiber diet recommended  Disposition:   FU in 6 months as scheduled   Signed, Evan Grooms, MD  01/15/2018 10:34 PM    [facility] [address], [place], [place]  [address] Phone: [phone]; Fax: [phone]

## 2018-01-16 ENCOUNTER — Encounter: Payer: Self-pay | Admitting: Interventional Cardiology

## 2018-01-16 ENCOUNTER — Ambulatory Visit: Payer: Medicare HMO | Admitting: Interventional Cardiology

## 2018-01-16 VITALS — BP 104/68 | HR 67 | Ht 65.0 in | Wt 177.6 lb

## 2018-01-16 DIAGNOSIS — I2510 Atherosclerotic heart disease of native coronary artery without angina pectoris: Secondary | ICD-10-CM

## 2018-01-16 DIAGNOSIS — Z951 Presence of aortocoronary bypass graft: Secondary | ICD-10-CM | POA: Diagnosis not present

## 2018-01-16 DIAGNOSIS — I300 Acute nonspecific idiopathic pericarditis: Secondary | ICD-10-CM | POA: Diagnosis not present

## 2018-01-16 DIAGNOSIS — I480 Paroxysmal atrial fibrillation: Secondary | ICD-10-CM

## 2018-01-16 DIAGNOSIS — I5032 Chronic diastolic (congestive) heart failure: Secondary | ICD-10-CM | POA: Diagnosis not present

## 2018-01-16 MED ORDER — FUROSEMIDE 20 MG PO TABS: ORAL_TABLET | ORAL | 3 refills | Status: DC

## 2018-01-16 NOTE — Patient Instructions (Signed)
Medication Instructions:  Your physician has recommended you make the following change in your medication:   FUROSEMIDE (LASIX) 20 MG TABLET: Take 1 tablet (20 mg total) once a day.  You may take an extra tablet (20 mg total) for increased swelling, weight gain, or shortness of breath.   If you need a refill on your cardiac medications before your next appointment, please call your pharmacy.   Lab work: Your physician recommends that you return for a FASTING lipid profile   If you have labs (blood work) drawn today and your tests are completely normal, you will receive your results only by: Marland Kitchen MyChart Message (if you have MyChart) OR . A paper copy in the mail If you have any lab test that is abnormal or we need to change your treatment, we will call you to review the results.  Testing/Procedures: None ordered  Follow-Up: At [facility], you and your health needs are our priority.  As part of our continuing mission to provide you with exceptional heart care, we have created designated Provider Care Teams.  These Care Teams include your primary Cardiologist (physician) and Advanced Practice Providers (APPs -  Physician Assistants and Nurse Practitioners) who all work together to provide you with the care you need, when you need it. . You will need a follow up appointment in April 2020.  Please call our office 2 months in advance to schedule this appointment.  You may see Casandra Doffing, MD or one of the following Advanced Practice Providers on your designated Care Team:   . Lyda Jester, PA-C . Dayna Dunn, PA-C . Ermalinda Barrios, PA-C  Any Other Special Instructions Will Be Listed Below (If Applicable).  \

## 2018-01-19 ENCOUNTER — Other Ambulatory Visit: Payer: Medicare HMO | Admitting: *Deleted

## 2018-01-19 DIAGNOSIS — Z951 Presence of aortocoronary bypass graft: Secondary | ICD-10-CM

## 2018-01-19 DIAGNOSIS — I480 Paroxysmal atrial fibrillation: Secondary | ICD-10-CM

## 2018-01-19 DIAGNOSIS — I300 Acute nonspecific idiopathic pericarditis: Secondary | ICD-10-CM | POA: Diagnosis not present

## 2018-01-19 DIAGNOSIS — I2510 Atherosclerotic heart disease of native coronary artery without angina pectoris: Secondary | ICD-10-CM

## 2018-01-19 LAB — LIPID PANEL
Chol/HDL Ratio: 3.2 ratio (ref 0.0–5.0)
Cholesterol, Total: 112 mg/dL (ref 100–199)
HDL: 35 mg/dL — ABNORMAL LOW (ref >39)
LDL Calculated: 59 mg/dL (ref 0–99)
Triglycerides: 92 mg/dL (ref 0–149)
VLDL Cholesterol Cal: 18 mg/dL (ref 5–40)

## 2018-01-22 ENCOUNTER — Telehealth: Payer: Self-pay

## 2018-01-22 NOTE — Telephone Encounter (Signed)
Attempted to contact patient regarding lab results but there was no answer.

## 2018-01-22 NOTE — Telephone Encounter (Signed)
-----   Message from Jettie Booze, MD sent at 01/19/2018  5:43 PM EDT ----- Lipids well controlled.

## 2018-01-23 NOTE — Telephone Encounter (Signed)
Attempted to contact patient again but there was no answer and VM not available.

## 2018-01-26 NOTE — Telephone Encounter (Signed)
Attempted to contact patient again and number keeps ringing busy. Multiple attempts have been made to contact patient. Will send letter with results.

## 2018-01-30 ENCOUNTER — Encounter: Payer: Self-pay | Admitting: Family Medicine

## 2018-01-30 ENCOUNTER — Ambulatory Visit (INDEPENDENT_AMBULATORY_CARE_PROVIDER_SITE_OTHER): Payer: Medicare HMO | Admitting: Family Medicine

## 2018-01-30 VITALS — BP 120/60 | HR 77 | Temp 98.8°F | Resp 28

## 2018-01-30 DIAGNOSIS — R509 Fever, unspecified: Secondary | ICD-10-CM | POA: Diagnosis not present

## 2018-01-30 LAB — INFLUENZA A AND B AG, IMMUNOASSAY
INFLUENZA A ANTIGEN: NOT DETECTED
INFLUENZA B ANTIGEN: NOT DETECTED

## 2018-01-30 MED ORDER — AZITHROMYCIN 250 MG PO TABS: ORAL_TABLET | ORAL | 0 refills | Status: DC

## 2018-01-30 MED ORDER — DEXTROSE 5 % IV SOLN
1.0000 g | Freq: Once | INTRAVENOUS | Status: AC
  Administered 2018-01-30: 1 g via INTRAVENOUS

## 2018-01-30 NOTE — Progress Notes (Signed)
Subjective:    Patient ID: Evan [name], male    DOB: [birth year], 84 y.o.   MRN: [id number]  HPI  Patient walked in urgently this afternoon at 4:00 complaining of feeling weak.  Initially a temporal thermometer was used and his temperature was found to be 102.9!.  We repeated this with an oral thermometer and his temperature was found to be 98.8.  Therefore I believe the temperature was incorrect.  I repeated the oral temperature again 20 minutes later and it was 97.8.  Patient's blood pressure initially upon walking and was 98/60.  Therefore we started the patient on normal saline and he was given a 500 cc bolus.  Patient states that he was fine when he went to sleep.  When he awoke from his nap this afternoon, he felt extremely shaky and weak.  His legs felt weak.  His daughter brought him straight to the office.  He states that he has had a cough over the last week that is gradually gotten worse.  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  However he does report increasing cough and increasing weakness over the last week.  He denies any sore throat.  He denies any rhinorrhea.  He denies any otalgia.  He denies any nausea or vomiting.  He denies any chest pain.  He denies any shortness of breath above his baseline.  He denies any dysuria.  He denies any urgency or frequency.  Initial flu test was negative. Past Medical History:  Diagnosis Date  . Adenocarcinoma of right lung ([place]) 01/06/2016  . Anginal chest pain at rest [facility])    Chronic non, controlled on Lorazepam  . Anxiety   . Arthritis    "all over"   . BPH (benign prostatic hyperplasia)   . CAD (coronary artery disease)   . Chronic lower back pain   . Complication of anesthesia    "problems making water afterwards"  . Depression   . DJD (degenerative joint disease)   .  Esophageal ulcer without bleeding    bid ppi indefinitely  . Family history of adverse reaction to anesthesia    "children w/PONV"  . GERD (gastroesophageal reflux disease)   . Headache    "couple/week maybe" (09/04/2015)  . Hemochromatosis    Possible, elevated iron stores, hook-like osteophytes on hand films, normal LFTs  . Hepatitis    "yellow jaundice as a baby"  . History of ASCVD    MULTIVESSEL  . Hyperlipidemia   . Hypertension   . Myocardial infarction ([place]) 1999  . Osteoarthritis   . Pneumonia    "several times; got a little now" (09/04/2015)  . Rheumatoid arthritis ([place])   . Subdural hematoma ([facility])    small after fall 09/2016-plavix held, neurosurgery consulted.  Asymptomatic.     Past Surgical History:  Procedure Laterality Date  . ARM SKIN LESION BIOPSY / EXCISION Left 09/02/2015  . CATARACT EXTRACTION W/ INTRAOCULAR LENS  IMPLANT, BILATERAL Bilateral   . CHOLECYSTECTOMY OPEN    . CORNEAL TRANSPLANT Bilateral    "one at [facility]; one at [facility]"  . CORONARY ANGIOPLASTY WITH STENT PLACEMENT    . CORONARY ARTERY BYPASS GRAFT  1999  . DESCENDING AORTIC ANEURYSM REPAIR W/ STENT     DE stent ostium into the right radial free graft at OM-, 02-2007  . ESOPHAGOGASTRODUODENOSCOPY  N/A 11/09/2014   Procedure: ESOPHAGOGASTRODUODENOSCOPY (EGD);  Surgeon: [name], MD;  Location: [facility] ENDOSCOPY;  Service: Endoscopy;  Laterality: N/A;  . INGUINAL HERNIA REPAIR    . JOINT REPLACEMENT    . LEFT HEART CATH AND CORS/GRAFTS ANGIOGRAPHY N/A 06/27/2017   Procedure: LEFT HEART CATH AND CORS/GRAFTS ANGIOGRAPHY;  Surgeon: [name], MD;  Location: [place] CV LAB;  Service: Cardiovascular;  Laterality: N/A;  . NASAL SINUS SURGERY    . SHOULDER OPEN ROTATOR CUFF REPAIR Right   . TOTAL KNEE ARTHROPLASTY Bilateral    Current Outpatient Medications on File Prior to Visit  Medication Sig Dispense Refill  . apixaban (ELIQUIS) 5 MG TABS tablet Take 1 tablet (5 mg total) by mouth 2 (two) times  daily. 60 tablet 11  . Cholecalciferol 2000 units TABS Take 1 tablet (2,000 Units total) by mouth daily. 30 each 11  . ENSURE (ENSURE) Take 237 mLs by mouth 2 (two) times daily between meals.    Marland Kitchen FLUoxetine (PROZAC) 20 MG capsule TAKE 20 mg  BY MOUTH DAILY 90 capsule 1  . furosemide (LASIX) 20 MG tablet Take 1 tablet (20 mg total) once a day. Take an extra tablet (20 mg total) for increased swelling, weight gain, or shortness of breath. 180 tablet 3  . ipratropium-albuterol (DUONEB) 0.5-2.5 (3) MG/3ML SOLN Take 3 mLs by nebulization every 4 (four) hours as needed (or Chest Pain / Tightness). 360 mL 3  . leflunomide (ARAVA) 10 MG tablet Take 10 mg by mouth daily.    Marland Kitchen Lifitegrast (XIIDRA) 5 % SOLN Place 1-2 drops into both eyes daily.    Marland Kitchen LORazepam (ATIVAN) 1 MG tablet TAKE ONE TABLET BY MOUTH EVERY EIGHT HOURS AS NEEDED FOR ANXIETY 90 tablet 0  . metoprolol succinate (TOPROL-XL) 25 MG 24 hr tablet TAKE ONE (1) TABLET BY MOUTH EVERY DAY 30 tablet 10  . nitroGLYCERIN (NITROSTAT) 0.4 MG SL tablet Place 1 tablet (0.4 mg total) under the tongue every 5 (five) minutes as needed for chest pain. 25 tablet 2  . pantoprazole (PROTONIX) 40 MG tablet Take one (1) tablet (40 mg) by mouth daily. 90 tablet 3  . rosuvastatin (CRESTOR) 5 MG tablet TAKE 5 mg  EVERY DAY 90 tablet 3  . tamsulosin (FLOMAX) 0.4 MG CAPS capsule TAKE 1 CAPSULE EVERY DAY 90 capsule 3   No current facility-administered medications on file prior to visit.    Allergies  Allergen Reactions  . Feldene [Piroxicam] Other (See Comments)    REACTION: Blisters  . Statins Other (See Comments)    Myalgias  . Doxycycline Other (See Comments)    REACTION:  unknown  . Latex Rash  . Tequin Anxiety  . Valium Other (See Comments)    REACTION:  unknown   Social History   Socioeconomic History  . Marital status: Widowed    Spouse name: Not on file  . Number of children: Not on file  . Years of education: Not on file  . Highest education  level: Not on file  Occupational History  . Not on file  Social Needs  . Financial resource strain: Not on file  . Food insecurity:    Worry: Not on file    Inability: Not on file  . Transportation needs:    Medical: Not on file    Non-medical: Not on file  Tobacco Use  . Smoking status: Never Smoker  . Smokeless tobacco: Former Systems developer    Types: Chew  . Tobacco comment: "quit  chewing in the 1960s"  Substance and Sexual Activity  . Alcohol use: No  . Drug use: No  . Sexual activity: Never  Lifestyle  . Physical activity:    Days per week: Not on file    Minutes per session: Not on file  . Stress: Not on file  Relationships  . Social connections:    Talks on phone: Not on file    Gets together: Not on file    Attends religious service: Not on file    Active member of club or organization: Not on file    Attends meetings of clubs or organizations: Not on file    Relationship status: Not on file  . Intimate partner violence:    Fear of current or ex partner: Not on file    Emotionally abused: Not on file    Physically abused: Not on file    Forced sexual activity: Not on file  Other Topics Concern  . Not on file  Social History Narrative   01-05-18 Unable to ask abuse questions family with him today.      Review of Systems  All other systems reviewed and are negative.      Objective:   Physical Exam  Constitutional: He appears well-developed and well-nourished. No distress.  HENT:  Right Ear: External ear normal.  Left Ear: External ear normal.  Nose: Nose normal.  Mouth/Throat: Oropharynx is clear and moist. No oropharyngeal exudate.  Eyes: Conjunctivae are normal.  Neck: Neck supple. No JVD present.  Cardiovascular: Normal rate, regular rhythm and normal heart sounds.  No murmur heard. Pulmonary/Chest: Effort normal. No respiratory distress. He has no wheezes. He has rales in the right lower field.      Abdominal: Soft. Bowel sounds are normal. He  exhibits no distension. There is no tenderness. There is no rebound and no guarding.  Musculoskeletal: He exhibits no edema.  Lymphadenopathy:    He has no cervical adenopathy.  Skin: He is not diaphoretic.  Vitals reviewed.         Assessment & Plan:  Fever, unspecified fever cause - Plan: Influenza A and B Ag, Immunoassay  Exam today is unremarkable.  The initial temperature was incorrect.  Repeated oral temperatures have been in a normal range between 97.8 and 98.8.  Therefore I will disregard the initial temporal scan which said 102.9.  Patient's blood pressure has improved to 120/60 after receiving a 500 cc bolus of normal saline.  Flu test is negative.  Given the increasing cough and the crackles on exam I will treat the patient for possible community-acquired pneumonia.  Patient was given 1 g of Rocephin IM x1 now and I will start him on a Z-Pak.  Patient will return tomorrow for reevaluation to ensure that he is in gradually improving.  CBC and CMP were sent.  Patient is instructed to go to the emergency room if symptoms worsen or change in any way.

## 2018-01-31 ENCOUNTER — Emergency Department (HOSPITAL_COMMUNITY)
Admission: EM | Admit: 2018-01-31 | Discharge: 2018-01-31 | Disposition: A | Discharge status: Home / Self Care | Payer: Medicare HMO | Attending: Emergency Medicine | Admitting: Emergency Medicine

## 2018-01-31 ENCOUNTER — Encounter (HOSPITAL_COMMUNITY): Payer: Self-pay | Admitting: *Deleted

## 2018-01-31 ENCOUNTER — Emergency Department (HOSPITAL_COMMUNITY): Payer: Medicare HMO

## 2018-01-31 ENCOUNTER — Ambulatory Visit (INDEPENDENT_AMBULATORY_CARE_PROVIDER_SITE_OTHER): Payer: Medicare HMO | Admitting: Family Medicine

## 2018-01-31 VITALS — BP 108/70 | HR 65 | Temp 97.7°F | Resp 23 | Wt 182.0 lb

## 2018-01-31 DIAGNOSIS — R059 Cough, unspecified: Secondary | ICD-10-CM

## 2018-01-31 DIAGNOSIS — I5030 Unspecified diastolic (congestive) heart failure: Secondary | ICD-10-CM | POA: Diagnosis not present

## 2018-01-31 DIAGNOSIS — Z91040 Latex allergy status: Secondary | ICD-10-CM | POA: Diagnosis not present

## 2018-01-31 DIAGNOSIS — Z79899 Other long term (current) drug therapy: Secondary | ICD-10-CM | POA: Diagnosis not present

## 2018-01-31 DIAGNOSIS — J40 Bronchitis, not specified as acute or chronic: Secondary | ICD-10-CM | POA: Diagnosis not present

## 2018-01-31 DIAGNOSIS — Z96651 Presence of right artificial knee joint: Secondary | ICD-10-CM | POA: Diagnosis not present

## 2018-01-31 DIAGNOSIS — I2510 Atherosclerotic heart disease of native coronary artery without angina pectoris: Secondary | ICD-10-CM | POA: Diagnosis not present

## 2018-01-31 DIAGNOSIS — R05 Cough: Secondary | ICD-10-CM | POA: Diagnosis not present

## 2018-01-31 DIAGNOSIS — Z951 Presence of aortocoronary bypass graft: Secondary | ICD-10-CM | POA: Insufficient documentation

## 2018-01-31 DIAGNOSIS — Z96652 Presence of left artificial knee joint: Secondary | ICD-10-CM | POA: Insufficient documentation

## 2018-01-31 DIAGNOSIS — R0602 Shortness of breath: Secondary | ICD-10-CM | POA: Diagnosis not present

## 2018-01-31 DIAGNOSIS — Z87891 Personal history of nicotine dependence: Secondary | ICD-10-CM | POA: Diagnosis not present

## 2018-01-31 DIAGNOSIS — R509 Fever, unspecified: Secondary | ICD-10-CM | POA: Diagnosis not present

## 2018-01-31 DIAGNOSIS — Z955 Presence of coronary angioplasty implant and graft: Secondary | ICD-10-CM | POA: Diagnosis not present

## 2018-01-31 DIAGNOSIS — R531 Weakness: Secondary | ICD-10-CM

## 2018-01-31 DIAGNOSIS — R0600 Dyspnea, unspecified: Secondary | ICD-10-CM | POA: Diagnosis not present

## 2018-01-31 DIAGNOSIS — I110 Hypertensive heart disease with heart failure: Secondary | ICD-10-CM | POA: Diagnosis not present

## 2018-01-31 DIAGNOSIS — I252 Old myocardial infarction: Secondary | ICD-10-CM | POA: Insufficient documentation

## 2018-01-31 LAB — CBC WITH DIFFERENTIAL/PLATELET
Abs Immature Granulocytes: 0.04 10*3/uL (ref 0.00–0.07)
Basophils Absolute: 21 {cells}/uL (ref 0–200)
Basophils Absolute: 0 10*3/uL (ref 0.0–0.1)
Basophils Relative: 0.2 %
Basophils Relative: 0 %
Eosinophils Absolute: 95 {cells}/uL (ref 15–500)
Eosinophils Absolute: 0.1 10*3/uL (ref 0.0–0.5)
Eosinophils Relative: 0.9 %
Eosinophils Relative: 1 %
HCT: 35.8 % — ABNORMAL LOW (ref 38.5–50.0)
HCT: 37 % — ABNORMAL LOW (ref 39.0–52.0)
Hemoglobin: 12.1 g/dL — ABNORMAL LOW (ref 13.2–17.1)
Hemoglobin: 11.8 g/dL — ABNORMAL LOW (ref 13.0–17.0)
Immature Granulocytes: 1 %
Lymphocytes Relative: 10 %
Lymphs Abs: 924 {cells}/uL (ref 850–3900)
Lymphs Abs: 0.8 10*3/uL (ref 0.7–4.0)
MCH: 31 pg (ref 27.0–33.0)
MCH: 30.9 pg (ref 26.0–34.0)
MCHC: 33.8 g/dL (ref 32.0–36.0)
MCHC: 31.9 g/dL (ref 30.0–36.0)
MCV: 91.8 fL (ref 80.0–100.0)
MCV: 96.9 fL (ref 80.0–100.0)
MPV: 13 fL — ABNORMAL HIGH (ref 7.5–12.5)
Monocytes Absolute: 1.1 10*3/uL — ABNORMAL HIGH (ref 0.1–1.0)
Monocytes Relative: 12 %
Monocytes Relative: 13 %
Neutro Abs: 8201 {cells}/uL — ABNORMAL HIGH (ref 1500–7800)
Neutro Abs: 6.4 10*3/uL (ref 1.7–7.7)
Neutrophils Relative %: 78.1 %
Neutrophils Relative %: 75 %
Platelets: 91 Thousand/uL — ABNORMAL LOW (ref 140–400)
Platelets: 80 10*3/uL — ABNORMAL LOW (ref 150–400)
RBC: 3.9 Million/uL — ABNORMAL LOW (ref 4.20–5.80)
RBC: 3.82 MIL/uL — ABNORMAL LOW (ref 4.22–5.81)
RDW: 12.7 % (ref 11.0–15.0)
RDW: 13.8 % (ref 11.5–15.5)
Total Lymphocyte: 8.8 %
WBC mixed population: 1260 {cells}/uL — ABNORMAL HIGH (ref 200–950)
WBC: 10.5 Thousand/uL (ref 3.8–10.8)
WBC: 8.5 10*3/uL (ref 4.0–10.5)
nRBC: 0 % (ref 0.0–0.2)

## 2018-01-31 LAB — COMPREHENSIVE METABOLIC PANEL
ALT: 16 U/L (ref 0–44)
AST: 23 U/L (ref 15–41)
Albumin: 3.4 g/dL — ABNORMAL LOW (ref 3.5–5.0)
Alkaline Phosphatase: 105 U/L (ref 38–126)
Anion gap: 10 (ref 5–15)
BUN: 21 mg/dL (ref 8–23)
CO2: 20 mmol/L — ABNORMAL LOW (ref 22–32)
Calcium: 8.7 mg/dL — ABNORMAL LOW (ref 8.9–10.3)
Chloride: 108 mmol/L (ref 98–111)
Creatinine, Ser: 1.33 mg/dL — ABNORMAL HIGH (ref 0.61–1.24)
GFR calc Af Amer: 53 mL/min — ABNORMAL LOW (ref >60)
GFR calc non Af Amer: 46 mL/min — ABNORMAL LOW (ref >60)
Glucose, Bld: 112 mg/dL — ABNORMAL HIGH (ref 70–99)
Potassium: 3.5 mmol/L (ref 3.5–5.1)
Sodium: 138 mmol/L (ref 135–145)
Total Bilirubin: 2 mg/dL — ABNORMAL HIGH (ref 0.3–1.2)
Total Protein: 6.1 g/dL — ABNORMAL LOW (ref 6.5–8.1)

## 2018-01-31 LAB — URINALYSIS, ROUTINE W REFLEX MICROSCOPIC
Bilirubin Urine: NEGATIVE
Glucose, UA: NEGATIVE mg/dL
Hgb urine dipstick: NEGATIVE
Ketones, ur: NEGATIVE mg/dL
Leukocytes, UA: NEGATIVE
Nitrite: NEGATIVE
Protein, ur: NEGATIVE mg/dL
Specific Gravity, Urine: 1.017 (ref 1.005–1.030)
pH: 5 (ref 5.0–8.0)

## 2018-01-31 LAB — COMPLETE METABOLIC PANEL WITH GFR
AG Ratio: 2 (calc) (ref 1.0–2.5)
ALT: 13 U/L (ref 9–46)
AST: 24 U/L (ref 10–35)
Albumin: 4.2 g/dL (ref 3.6–5.1)
Alkaline phosphatase (APISO): 113 U/L (ref 40–115)
BUN/Creatinine Ratio: 17 (calc) (ref 6–22)
BUN: 28 mg/dL — ABNORMAL HIGH (ref 7–25)
CO2: 18 mmol/L — ABNORMAL LOW (ref 20–32)
Calcium: 9 mg/dL (ref 8.6–10.3)
Chloride: 106 mmol/L (ref 98–110)
Creat: 1.66 mg/dL — ABNORMAL HIGH (ref 0.70–1.11)
GFR, Est African American: 42 mL/min/{1.73_m2} — ABNORMAL LOW (ref >=60)
GFR, Est Non African American: 36 mL/min/{1.73_m2} — ABNORMAL LOW (ref >=60)
Globulin: 2.1 g/dL (calc) (ref 1.9–3.7)
Glucose, Bld: 123 mg/dL — ABNORMAL HIGH (ref 65–99)
Potassium: 4.5 mmol/L (ref 3.5–5.3)
Sodium: 140 mmol/L (ref 135–146)
Total Bilirubin: 1.3 mg/dL — ABNORMAL HIGH (ref 0.2–1.2)
Total Protein: 6.3 g/dL (ref 6.1–8.1)

## 2018-01-31 LAB — I-STAT CG4 LACTIC ACID, ED
Lactic Acid, Venous: 1.43 mmol/L (ref 0.5–1.9)
Lactic Acid, Venous: 1.88 mmol/L (ref 0.5–1.9)

## 2018-01-31 MED ORDER — ALBUTEROL SULFATE HFA 108 (90 BASE) MCG/ACT IN AERS
2.0000 | INHALATION_SPRAY | RESPIRATORY_TRACT | Status: DC | PRN
  Administered 2018-01-31: 2 via RESPIRATORY_TRACT
  Filled 2018-01-31: qty 6.7

## 2018-01-31 MED ORDER — AMOXICILLIN-POT CLAVULANATE 875-125 MG PO TABS: 1.0000 | ORAL_TABLET | Freq: Two times a day (BID) | ORAL | 0 refills | Status: DC

## 2018-01-31 MED ORDER — AEROCHAMBER PLUS FLO-VU LARGE MISC
Status: AC
  Filled 2018-01-31: qty 1

## 2018-01-31 MED ORDER — SODIUM CHLORIDE 0.9 % IV BOLUS
500.0000 mL | Freq: Once | INTRAVENOUS | Status: AC
  Administered 2018-01-31: 500 mL via INTRAVENOUS

## 2018-01-31 MED ORDER — ACETAMINOPHEN 325 MG PO TABS
650.0000 mg | ORAL_TABLET | Freq: Once | ORAL | Status: AC
  Administered 2018-01-31: 650 mg via ORAL
  Filled 2018-01-31: qty 2

## 2018-01-31 MED ORDER — AEROCHAMBER PLUS FLO-VU MEDIUM MISC
1.0000 | Freq: Once | Status: AC
  Administered 2018-01-31: 1
  Filled 2018-01-31: qty 1

## 2018-01-31 NOTE — ED Notes (Signed)
Set #1 of blood cultures collected in addition to: dark green top, light green top, lavender top, and pink top.

## 2018-01-31 NOTE — ED Provider Notes (Signed)
[facility] EMERGENCY DEPARTMENT Provider Note   CSN: [id number] Arrival date & time: 01/31/18  1100     History   Chief Complaint Chief Complaint  Patient presents with  . Shortness of Breath    HPI Evan [name] is a 84 y.o. male.  HPI  84 yo male presents complaining of cough, fever, sob and weakness.  Patient seen yesterday at [facility] with fever, chills, cough, nausea, and one epiisode of diarrhea.  He was given 500 cc ns.  He returned to clinic today complaining of worsening cough, dyspena and weakness.  He was subsequently sent to ED for further treatment and evaluation. Zithromax was started yesteray.  He is on eliaquis, and per pmd note he is on RA mediacatins with ummune modulators- howevrer not noted in list.   Past Medical History:  Diagnosis Date  . Adenocarcinoma of right lung ([place]) 01/06/2016  . Anginal chest pain at rest [facility])    Chronic non, controlled on Lorazepam  . Anxiety   . Arthritis    "all over"   . BPH (benign prostatic hyperplasia)   . CAD (coronary artery disease)   . Chronic lower back pain   . Complication of anesthesia    "problems making water afterwards"  . Depression   . DJD (degenerative joint disease)   . Esophageal ulcer without bleeding    bid ppi indefinitely  . Family history of adverse reaction to anesthesia    "children w/PONV"  . GERD (gastroesophageal reflux disease)   . Headache    "couple/week maybe" (09/04/2015)  . Hemochromatosis    Possible, elevated iron stores, hook-like osteophytes on hand films, normal LFTs  . Hepatitis    "yellow jaundice as a baby"  . History of ASCVD    MULTIVESSEL  . Hyperlipidemia   . Hypertension   . Myocardial infarction ([place]) 1999  . Osteoarthritis   . Pneumonia    "several times; got a little now" (09/04/2015)  . Rheumatoid arthritis ([place])   . Subdural hematoma ([facility])    small after fall 09/2016-plavix held, neurosurgery consulted.  Asymptomatic.      Patient Active  Problem List   Diagnosis Date Noted  . Non-ST elevation MI (NSTEMI) ([place]) 06/27/2017  . Diastolic CHF ([place]) 70/96/2836  . Chest pain due to CAD ([place]) 06/26/2017  . Dyspnea 06/21/2017  . Acute bronchitis 05/15/2017  . Oral candidiasis 05/15/2017  . Acute pericarditis   . Chest pain at rest 12/20/2016  . Angina pectoris ([place]) 12/20/2016  . Radiation pneumonitis ([place]) 11/09/2016  . Pleurisy 10/11/2016  . Chest wall pain 10/11/2016  . Atrial fibrillation ([place]) 05/21/2016  . Adenocarcinoma of right lung ([place]) 01/06/2016  . GAD (generalized anxiety disorder) 11/20/2015  . Essential hypertension   . Cellulitis 09/04/2015  . Cellulitis of left axilla   . Esophageal ulcer without bleeding   . Bilateral lower extremity edema 04/28/2014  . BPH (benign prostatic hyperplasia) 10/08/2012  . Anxiety   . EKG abnormalities 09/05/2012  . Tremor 09/05/2012  . Chronic fatigue 09/05/2012  . Arthritis   . Asthma, chronic   . Reflux esophagitis   . S/P CABG (coronary artery bypass graft)   . GERD (gastroesophageal reflux disease)   . Hyperlipidemia   . Hypertension   . UNSPECIFIED PERIPHERAL VASCULAR DISEASE 06/16/2009    Past Surgical History:  Procedure Laterality Date  . ARM SKIN LESION BIOPSY / EXCISION Left 09/02/2015  . CATARACT EXTRACTION W/ INTRAOCULAR LENS  IMPLANT, BILATERAL Bilateral   .  CHOLECYSTECTOMY OPEN    . CORNEAL TRANSPLANT Bilateral    "one at [facility]; one at [facility]"  . CORONARY ANGIOPLASTY WITH STENT PLACEMENT    . CORONARY ARTERY BYPASS GRAFT  1999  . DESCENDING AORTIC ANEURYSM REPAIR W/ STENT     DE stent ostium into the right radial free graft at OM-, 02-2007  . ESOPHAGOGASTRODUODENOSCOPY N/A 11/09/2014   Procedure: ESOPHAGOGASTRODUODENOSCOPY (EGD);  Surgeon: [name], MD;  Location: [facility];  Service: Endoscopy;  Laterality: N/A;  . INGUINAL HERNIA REPAIR    . JOINT REPLACEMENT    . LEFT HEART CATH AND CORS/GRAFTS ANGIOGRAPHY N/A 06/27/2017   Procedure: LEFT HEART  CATH AND CORS/GRAFTS ANGIOGRAPHY;  Surgeon: [name], MD;  Location: [facility];  Service: Cardiovascular;  Laterality: N/A;  . NASAL SINUS SURGERY    . SHOULDER OPEN ROTATOR CUFF REPAIR Right   . TOTAL KNEE ARTHROPLASTY Bilateral         Home Medications    Prior to Admission medications   Medication Sig Start Date End Date Taking? Authorizing Provider  apixaban (ELIQUIS) 5 MG TABS tablet Take 1 tablet (5 mg total) by mouth 2 (two) times daily. 01/31/17  Yes Jettie Booze, MD  azithromycin (ZITHROMAX) 250 MG tablet 2 tabs poqday1, 1 tab poqday 2-5 Patient taking differently: Take 250 mg by mouth See admin instructions. Take 2 tablets by mouth on day 1, then 1 tablet days 2-5 until all taken. 01/30/18  Yes [name], MD  Cholecalciferol 2000 units TABS Take 1 tablet (2,000 Units total) by mouth daily. 02/04/16  Yes PickardCammie Mcgee, MD  ENSURE (ENSURE) Take 237 mLs by mouth 2 (two) times daily between meals.   Yes [provider]  FLUoxetine (PROZAC) 20 MG capsule TAKE 20 mg  BY MOUTH DAILY Patient taking differently: Take 20 mg by mouth daily.  12/23/16  Yes Kilroy, Luke K, PA-C  furosemide (LASIX) 20 MG tablet Take 1 tablet (20 mg total) once a day. Take an extra tablet (20 mg total) for increased swelling, weight gain, or shortness of breath. Patient taking differently: Take 20 mg by mouth daily. May take an extra tablet (20 mg total) for increased swelling, weight gain, or shortness of breath. 01/16/18  Yes Jettie Booze, MD  ipratropium-albuterol (DUONEB) 0.5-2.5 (3) MG/3ML SOLN Take 3 mLs by nebulization every 4 (four) hours as needed (or Chest Pain / Tightness). 12/23/16  Yes Kilroy, Luke K, PA-C  leflunomide (ARAVA) 10 MG tablet Take 10 mg by mouth daily. 06/30/17  Yes [provider]  Lifitegrast Shirley Friar) 5 % SOLN Place 1-2 drops into both eyes daily.    Yes [provider]  LORazepam (ATIVAN) 1 MG tablet TAKE ONE TABLET  BY MOUTH EVERY EIGHT HOURS AS NEEDED FOR ANXIETY Patient taking differently: 1 mg every 8 (eight) hours as needed for anxiety.  01/04/18  Yes [name], MD  metoprolol succinate (TOPROL-XL) 25 MG 24 hr tablet TAKE ONE (1) TABLET BY MOUTH EVERY DAY Patient taking differently: Take 25 mg by mouth daily.  09/11/17  Yes Jettie Booze, MD  nitroGLYCERIN (NITROSTAT) 0.4 MG SL tablet Place 1 tablet (0.4 mg total) under the tongue every 5 (five) minutes as needed for chest pain. 12/06/17  Yes Daune Perch, NP  pantoprazole (PROTONIX) 40 MG tablet Take one (1) tablet (40 mg) by mouth daily. Patient taking differently: Take 40 mg by mouth daily.  02/21/17  Yes Jettie Booze, MD  rosuvastatin (  CRESTOR) 5 MG tablet TAKE 5 mg  EVERY DAY Patient taking differently: Take 5 mg by mouth daily.  07/07/17  Yes Jettie Booze, MD  tamsulosin (FLOMAX) 0.4 MG CAPS capsule TAKE 1 CAPSULE EVERY DAY Patient taking differently: Take 0.4 mg by mouth daily.  10/18/17  Yes [name], MD    Family History Family History  Problem Relation Age of Onset  . Arthritis-Osteo Sister   . Heart attack Brother   . Heart attack Other   . Cancer Neg Hx     Social History Social History   Tobacco Use  . Smoking status: Never Smoker  . Smokeless tobacco: Former Systems developer    Types: Chew  . Tobacco comment: "quit chewing in the 1960s"  Substance Use Topics  . Alcohol use: No  . Drug use: No     Allergies   Feldene [piroxicam]; Statins; Doxycycline; Latex; Tequin; and Valium   Review of Systems Review of Systems  Constitutional: Positive for activity change, appetite change, chills, fatigue and fever.  HENT: Negative.   Eyes: Negative.   Respiratory: Positive for cough and shortness of breath.   Cardiovascular: Negative.   Gastrointestinal: Positive for diarrhea and nausea.  Endocrine: Negative.   Genitourinary: Negative.   Musculoskeletal: Negative.   Skin: Negative.   Neurological:  Negative.   Hematological: Negative.   Psychiatric/Behavioral: Negative.   All other systems reviewed and are negative.    Physical Exam Updated Vital Signs BP (!) 156/76   Pulse 65   Resp (!) 26   SpO2 98%   Physical Exam   ED Treatments / Results  Labs (all labs ordered are listed, but only abnormal results are displayed) Labs Reviewed  COMPREHENSIVE METABOLIC PANEL - Abnormal; Notable for the following components:      Result Value   CO2 20 (*)    Glucose, Bld 112 (*)    Creatinine, Ser 1.33 (*)    Calcium 8.7 (*)    Total Protein 6.1 (*)    Albumin 3.4 (*)    Total Bilirubin 2.0 (*)    GFR calc non Af Amer 46 (*)    GFR calc Af Amer 53 (*)    All other components within normal limits  CBC WITH DIFFERENTIAL/PLATELET - Abnormal; Notable for the following components:   RBC 3.82 (*)    Hemoglobin 11.8 (*)    HCT 37.0 (*)    Platelets 80 (*)    Monocytes Absolute 1.1 (*)    All other components within normal limits  CULTURE, BLOOD (ROUTINE X 2)  CULTURE, BLOOD (ROUTINE X 2)  URINALYSIS, ROUTINE W REFLEX MICROSCOPIC  I-STAT CG4 LACTIC ACID, ED  I-STAT CG4 LACTIC ACID, ED    EKG None  Radiology Dg Chest 2 View  Result Date: 01/31/2018 CLINICAL DATA:  Productive cough for the past 2-3 weeks. History of lung cancer. EXAM: CHEST - 2 VIEW COMPARISON:  CT chest dated January 04, 2018. Chest x-Avarie Tavano dated December 02, 2017. FINDINGS: Stable mild cardiomegaly status post CABG. Coarsened interstitial markings are similar to prior studies. Bibasilar atelectasis/scarring is unchanged. No focal consolidation, pleural effusion, or pneumothorax. No acute osseous abnormality. IMPRESSION: No active cardiopulmonary disease. Electronically Signed   By: Titus Dubin M.D.   On: 01/31/2018 11:46    Procedures Procedures (including critical care time)  Medications Ordered in ED Medications  sodium chloride 0.9 % bolus 500 mL (500 mLs Intravenous New Bag/Given 01/31/18 1324)      Initial Impression / Assessment  and Plan / ED Course  I have reviewed the triage vital signs and the nursing notes.  Pertinent labs & imaging results that were available during my care of the patient were reviewed by me and considered in my medical decision making (see chart for details).    1- febrile illness- cxr stable, creatinine improved from yesterday, no vomiting, one episode of diarrhea several days ago and taking po without difficulty.  PMD office reports that flu test was negative. CXR without infiltrate Urine clear No evidence of cellulitis or other skin, abscess or extremity infection Patient on zithromax Lactic acid normal WBC normal Mild tachypnea, but no decreased sats  2 aki- reported by pmd-1.66 yesterday.  1.33 today which appears close to baseline.  Reassessed patient discussed lab findings Plan recheck temperature Plan p.o. challenge Plan ambulation If patient able to do above, will discharge to home with follow-up in office 4:16 PM Patient ambulated well and is taking p.o. without difficulty.  He remains afebrile.  He is coughing up some discolored sputum.  Plan change Zithromax to augmentin (allergy to doxy).  He will be given an albuterol HFA here in the department with a spacer.  He has a given a dose of Tylenol and advised regarding taking Tylenol.  We have also discussed return precautions need for close follow-up and he and son voiced understanding.  Final Clinical Impressions(s) / ED Diagnoses   Final diagnoses:  Bronchitis    ED Discharge Orders         Ordered    amoxicillin-clavulanate (AUGMENTIN) 875-125 MG tablet  Every 12 hours     01/31/18 1620           Pattricia Boss, MD 01/31/18 1622

## 2018-01-31 NOTE — ED Notes (Signed)
Patient verbalizes understanding of discharge instructions. Opportunity for questioning and answers were provided. Ambulatory at discharge in NAD.  

## 2018-01-31 NOTE — ED Notes (Signed)
Pt provided with ice water

## 2018-01-31 NOTE — Progress Notes (Signed)
Patient ID: Evan [name], male    DOB: December 06, 1927, 84 y.o.   MRN: [id number]  PCP: [name], MD  Chief Complaint  Patient presents with  . Follow-up    blood pressure  . Shortness of Breath    Subjective:   Evan [name] is a 84 y.o. male, presents to clinic with CC of cough, shortness of breath and weakness that did not improve since yesterday, he feels worse and feels cold. Coughing is worse, shortness of breath is exertional, he denies orthopnea, lower extremity edema, PND, chest pain.   He was given 500 cc normal saline bolus yesterday afternoon in clinic, reports increased urination.  He denies any sweats, nausea, vomiting, diarrhea   He has hx of CHF, CAD, adenocarcinoma of right lung, he is on RA medication/immune modulators    Wt Readings from Last 5 Encounters:  01/31/18 182 lb (82.6 kg)  01/16/18 177 lb 9.6 oz (80.6 kg)  01/05/18 178 lb (80.7 kg)  12/06/17 174 lb (78.9 kg)  11/09/17 177 lb (80.3 kg)     Patient Active Problem List   Diagnosis Date Noted  . Non-ST elevation MI (NSTEMI) ([place]) 06/27/2017  . Diastolic CHF ([place]) 40/98/1191  . Chest pain due to CAD ([place]) 06/26/2017  . Dyspnea 06/21/2017  . Acute bronchitis 05/15/2017  . Oral candidiasis 05/15/2017  . Acute pericarditis   . Chest pain at rest 12/20/2016  . Angina pectoris ([place]) 12/20/2016  . Radiation pneumonitis ([place]) 11/09/2016  . Pleurisy 10/11/2016  . Chest wall pain 10/11/2016  . Atrial fibrillation ([place]) 05/21/2016  . Adenocarcinoma of right lung ([place]) 01/06/2016  . GAD (generalized anxiety disorder) 11/20/2015  . Essential hypertension   . Cellulitis 09/04/2015  . Cellulitis of left axilla   . Esophageal ulcer without bleeding   . Bilateral lower extremity edema 04/28/2014  . BPH (benign prostatic hyperplasia) 10/08/2012  . Anxiety   . EKG abnormalities 09/05/2012  . Tremor 09/05/2012  . Chronic fatigue 09/05/2012  . Arthritis   . Asthma, chronic   . Reflux esophagitis    . S/P CABG (coronary artery bypass graft)   . GERD (gastroesophageal reflux disease)   . Hyperlipidemia   . Hypertension   . UNSPECIFIED PERIPHERAL VASCULAR DISEASE 06/16/2009     Prior to Admission medications   Medication Sig Start Date End Date Taking? Authorizing Provider  apixaban (ELIQUIS) 5 MG TABS tablet Take 1 tablet (5 mg total) by mouth 2 (two) times daily. 01/31/17  Yes Jettie Booze, MD  azithromycin [facility]) 250 MG tablet 2 tabs poqday1, 1 tab poqday 2-5 01/30/18  Yes [name], MD  Cholecalciferol 2000 units TABS Take 1 tablet (2,000 Units total) by mouth daily. 02/04/16  Yes PickardCammie Mcgee, MD  ENSURE (ENSURE) Take 237 mLs by mouth 2 (two) times daily between meals.   Yes [provider]  FLUoxetine (PROZAC) 20 MG capsule TAKE 20 mg  BY MOUTH DAILY 12/23/16  Yes Kilroy, Luke K, PA-C  furosemide (LASIX) 20 MG tablet Take 1 tablet (20 mg total) once a day. Take an extra tablet (20 mg total) for increased swelling, weight gain, or shortness of breath. 01/16/18  Yes Jettie Booze, MD  ipratropium-albuterol (DUONEB) 0.5-2.5 (3) MG/3ML SOLN Take 3 mLs by nebulization every 4 (four) hours as needed (or Chest Pain / Tightness). 12/23/16  Yes Kilroy, Luke K, PA-C  leflunomide (ARAVA) 10 MG tablet Take 10 mg by mouth daily. 06/30/17  Yes [provider]  Lifitegrast (XIIDRA) 5 % SOLN Place 1-2 drops into both eyes daily.   Yes [provider]  LORazepam (ATIVAN) 1 MG tablet TAKE ONE TABLET BY MOUTH EVERY EIGHT HOURS AS NEEDED FOR ANXIETY 01/04/18  Yes [name], MD  metoprolol succinate (TOPROL-XL) 25 MG 24 hr tablet TAKE ONE (1) TABLET BY MOUTH EVERY DAY 09/11/17  Yes Jettie Booze, MD  nitroGLYCERIN (NITROSTAT) 0.4 MG SL tablet Place 1 tablet (0.4 mg total) under the tongue every 5 (five) minutes as needed for chest pain. 12/06/17  Yes Daune Perch, NP  pantoprazole (PROTONIX) 40 MG tablet Take one (1) tablet (40 mg) by  mouth daily. 02/21/17  Yes Jettie Booze, MD  rosuvastatin (CRESTOR) 5 MG tablet TAKE 5 mg  EVERY DAY 07/07/17  Yes Jettie Booze, MD  tamsulosin (FLOMAX) 0.4 MG CAPS capsule TAKE 1 CAPSULE EVERY DAY 10/18/17  Yes [name], MD     Allergies  Allergen Reactions  . Feldene [Piroxicam] Other (See Comments)    REACTION: Blisters  . Statins Other (See Comments)    Myalgias  . Doxycycline Other (See Comments)    REACTION:  unknown  . Latex Rash  . Tequin Anxiety  . Valium Other (See Comments)    REACTION:  unknown     Family History  Problem Relation Age of Onset  . Arthritis-Osteo Sister   . Heart attack Brother   . Heart attack Other   . Cancer Neg Hx      Social History   Socioeconomic History  . Marital status: Widowed    Spouse name: Not on file  . Number of children: Not on file  . Years of education: Not on file  . Highest education level: Not on file  Occupational History  . Not on file  Social Needs  . Financial resource strain: Not on file  . Food insecurity:    Worry: Not on file    Inability: Not on file  . Transportation needs:    Medical: Not on file    Non-medical: Not on file  Tobacco Use  . Smoking status: Never Smoker  . Smokeless tobacco: Former Systems developer    Types: Chew  . Tobacco comment: "quit chewing in the 1960s"  Substance and Sexual Activity  . Alcohol use: No  . Drug use: No  . Sexual activity: Never  Lifestyle  . Physical activity:    Days per week: Not on file    Minutes per session: Not on file  . Stress: Not on file  Relationships  . Social connections:    Talks on phone: Not on file    Gets together: Not on file    Attends religious service: Not on file    Active member of club or organization: Not on file    Attends meetings of clubs or organizations: Not on file    Relationship status: Not on file  . Intimate partner violence:    Fear of current or ex partner: Not on file    Emotionally abused: Not on  file    Physically abused: Not on file    Forced sexual activity: Not on file  Other Topics Concern  . Not on file  Social History Narrative   01-05-18 Unable to ask abuse questions family with him today.     Review of Systems  Constitutional: Positive for activity change, chills and fatigue. Negative for unexpected weight change.  HENT: Negative.   Eyes: Negative.  Respiratory: Negative.   Cardiovascular: Negative.   Gastrointestinal: Negative.   Endocrine: Negative.   Genitourinary: Negative.   Musculoskeletal: Negative.   Skin: Negative.   Allergic/Immunologic: Negative.   Neurological: Negative.   Hematological: Negative.   Psychiatric/Behavioral: Negative.   All other systems reviewed and are negative.      Objective:    Vitals:   01/31/18 0904  BP: 108/70  Pulse: 65  SpO2: 97%  Weight: 182 lb (82.6 kg)      Physical Exam  Constitutional: He appears well-developed. He has a sickly appearance. He appears ill. No distress.  Ill-appearing elderly male, alert, mildly tachypneic at rest, slightly pale appearing in face  HENT:  Head: Normocephalic and atraumatic.  Nose: Nose normal.  Mouth/Throat: Mucous membranes are dry and not cyanotic. He has dentures. No posterior oropharyngeal erythema.  Eyes: Conjunctivae are normal. Right eye exhibits no discharge. Left eye exhibits no discharge.  Neck: Trachea normal. No tracheal deviation present.  Cardiovascular: Normal rate and regular rhythm. Exam reveals no gallop, no friction rub and no decreased pulses.  Pulses:      Radial pulses are 2+ on the right side, and 2+ on the left side.       Posterior tibial pulses are 2+ on the right side, and 2+ on the left side.  No LE edema  Pulmonary/Chest: Effort normal. No accessory muscle usage or stridor. Tachypnea noted. No respiratory distress. He has decreased breath sounds in the right lower field and the left lower field. He has no wheezes. He has no rhonchi. He has  rales.  Faint fine rales to b/l bases with diminished BS, no wheeze With ambulation very tachypneic, HR 63-94's and SpO2 94-100%  Musculoskeletal: Normal range of motion.  Neurological: He is alert. He exhibits normal muscle tone. Coordination normal.  Skin: Skin is warm and dry. No rash noted.  Psychiatric: He has a normal mood and affect. His behavior is normal.  Nursing note and vitals reviewed.    Results for orders placed or performed in visit on 01/30/18  Influenza A and B Ag, Immunoassay  Result Value Ref Range   Source: NASOPHARYNX    INFLUENZA A ANTIGEN NOT DETECTED NOT DETECT   INFLUENZA B ANTIGEN NOT DETECTED NOT DETECT   COMMENT:    CBC with Differential/Platelet  Result Value Ref Range   WBC 10.5 3.8 - 10.8 Thousand/uL   RBC 3.90 (L) 4.20 - 5.80 Million/uL   Hemoglobin 12.1 (L) 13.2 - 17.1 g/dL   HCT 35.8 (L) 38.5 - 50.0 %   MCV 91.8 80.0 - 100.0 fL   MCH 31.0 27.0 - 33.0 pg   MCHC 33.8 32.0 - 36.0 g/dL   RDW 12.7 11.0 - 15.0 %   Platelets 91 (L) 140 - 400 Thousand/uL   MPV 13.0 (H) 7.5 - 12.5 fL   Neutro Abs 8,201 (H) 1,500 - 7,800 cells/uL   Lymphs Abs 924 850 - 3,900 cells/uL   WBC mixed population 1,260 (H) 200 - 950 cells/uL   Eosinophils Absolute 95 15 - 500 cells/uL   Basophils Absolute 21 0 - 200 cells/uL   Neutrophils Relative % 78.1 %   Total Lymphocyte 8.8 %   Monocytes Relative 12.0 %   Eosinophils Relative 0.9 %   Basophils Relative 0.2 %  COMPLETE METABOLIC PANEL WITH GFR  Result Value Ref Range   Glucose, Bld 123 (H) 65 - 99 mg/dL   BUN 28 (H) 7 - 25 mg/dL   Creat 1.66 (  H) 0.70 - 1.11 mg/dL   GFR, Est Non African American 36 (L) > OR = 60 mL/min/1.39m2   GFR, Est African American 42 (L) > OR = 60 mL/min/1.71m2   BUN/Creatinine Ratio 17 6 - 22 (calc)   Sodium 140 135 - 146 mmol/L   Potassium 4.5 3.5 - 5.3 mmol/L   Chloride 106 98 - 110 mmol/L   CO2 18 (L) 20 - 32 mmol/L   Calcium 9.0 8.6 - 10.3 mg/dL   Total Protein 6.3 6.1 - 8.1 g/dL    Albumin 4.2 3.6 - 5.1 g/dL   Globulin 2.1 1.9 - 3.7 g/dL (calc)   AG Ratio 2.0 1.0 - 2.5 (calc)   Total Bilirubin 1.3 (H) 0.2 - 1.2 mg/dL   Alkaline phosphatase (APISO) 113 40 - 115 U/L   AST 24 10 - 35 U/L   ALT 13 9 - 46 U/L        Assessment & Plan:      ICD-10-CM   1. SOB (shortness of breath) R06.02   2. Cough R05   3. Weakness R53.1      Yesterday flu was negative Febrile at presentation yesterday, had some difficulty with reading of temp by mouth and similar difficulty today.  Scanning temp of forehead is normal, he feels mildly caloric to the touch.  Ambulatory pulse ox:  I feel he is too high risk to continue outpt tx of unknown cause of SOB, cough and weakness.  Labs from yesterday show mild AKI and elevated mixed WBC's  Sent to ER POV with son    Delsa Grana, PA-C 01/31/18 9:22 AM

## 2018-01-31 NOTE — ED Triage Notes (Signed)
Pt sent over from his primary care office for worsening cough and shortness of breath, seen at an urgent care yesterday and was given IV fluids and they are concerned this increased symptoms, no distress on arrival, pt reports productive cough with green sputum, started on antibiotics with no improvement

## 2018-01-31 NOTE — ED Notes (Signed)
Pt ambulated up and down hallway; O2 dropped as low as 94 on RA, HR was tachy to 101. Pt did breath audibly harder and faster. Pt walked with mild stiffness, but gait, balance, and strength were otherwise normal. When Pt was asked how he felt after walking, he stated "I feel great."

## 2018-01-31 NOTE — Discharge Instructions (Addendum)
Take tylenol every four hours as needed for chills or body aches Stop zithromax and take augmentin as prescribed Drink plenty of fluids Return for recheck with your primary care doctor tomorrow Return here if worse at any time especially difficulty breathing, high fever or inability to tolerate fluids Use inhaler two puffs every four hours as needed for cough

## 2018-02-01 ENCOUNTER — Other Ambulatory Visit: Payer: Self-pay | Admitting: Family Medicine

## 2018-02-01 ENCOUNTER — Other Ambulatory Visit: Payer: Self-pay | Admitting: Interventional Cardiology

## 2018-02-01 ENCOUNTER — Ambulatory Visit: Payer: Medicare HMO | Admitting: Family Medicine

## 2018-02-01 NOTE — Telephone Encounter (Signed)
Last OV 01/31/2018 Last refill 01/04/2018 Ok to refill?

## 2018-02-02 ENCOUNTER — Telehealth: Payer: Self-pay | Admitting: Internal Medicine

## 2018-02-02 ENCOUNTER — Observation Stay (HOSPITAL_COMMUNITY): Payer: Medicare HMO

## 2018-02-02 ENCOUNTER — Encounter (HOSPITAL_COMMUNITY): Payer: Self-pay | Admitting: Emergency Medicine

## 2018-02-02 ENCOUNTER — Observation Stay (HOSPITAL_COMMUNITY)
Admission: EM | Admit: 2018-02-02 | Discharge: 2018-02-03 | Disposition: A | Discharge status: Home Health | Payer: Medicare HMO | Attending: Internal Medicine | Admitting: Internal Medicine

## 2018-02-02 ENCOUNTER — Other Ambulatory Visit: Payer: Self-pay

## 2018-02-02 ENCOUNTER — Observation Stay (HOSPITAL_BASED_OUTPATIENT_CLINIC_OR_DEPARTMENT_OTHER): Payer: Medicare HMO

## 2018-02-02 ENCOUNTER — Emergency Department (HOSPITAL_COMMUNITY): Payer: Medicare HMO

## 2018-02-02 DIAGNOSIS — R599 Enlarged lymph nodes, unspecified: Secondary | ICD-10-CM | POA: Diagnosis not present

## 2018-02-02 DIAGNOSIS — Z7901 Long term (current) use of anticoagulants: Secondary | ICD-10-CM | POA: Insufficient documentation

## 2018-02-02 DIAGNOSIS — N401 Enlarged prostate with lower urinary tract symptoms: Secondary | ICD-10-CM

## 2018-02-02 DIAGNOSIS — I82452 Acute embolism and thrombosis of left peroneal vein: Secondary | ICD-10-CM | POA: Diagnosis not present

## 2018-02-02 DIAGNOSIS — I749 Embolism and thrombosis of unspecified artery: Secondary | ICD-10-CM

## 2018-02-02 DIAGNOSIS — K210 Gastro-esophageal reflux disease with esophagitis: Secondary | ICD-10-CM | POA: Diagnosis not present

## 2018-02-02 DIAGNOSIS — R091 Pleurisy: Secondary | ICD-10-CM | POA: Diagnosis present

## 2018-02-02 DIAGNOSIS — Z8249 Family history of ischemic heart disease and other diseases of the circulatory system: Secondary | ICD-10-CM | POA: Insufficient documentation

## 2018-02-02 DIAGNOSIS — Z923 Personal history of irradiation: Secondary | ICD-10-CM | POA: Insufficient documentation

## 2018-02-02 DIAGNOSIS — I4821 Permanent atrial fibrillation: Secondary | ICD-10-CM | POA: Diagnosis present

## 2018-02-02 DIAGNOSIS — M1990 Unspecified osteoarthritis, unspecified site: Secondary | ICD-10-CM | POA: Diagnosis not present

## 2018-02-02 DIAGNOSIS — I10 Essential (primary) hypertension: Secondary | ICD-10-CM | POA: Diagnosis not present

## 2018-02-02 DIAGNOSIS — Z87820 Personal history of traumatic brain injury: Secondary | ICD-10-CM | POA: Insufficient documentation

## 2018-02-02 DIAGNOSIS — R079 Chest pain, unspecified: Secondary | ICD-10-CM | POA: Diagnosis not present

## 2018-02-02 DIAGNOSIS — M069 Rheumatoid arthritis, unspecified: Secondary | ICD-10-CM | POA: Diagnosis present

## 2018-02-02 DIAGNOSIS — I2510 Atherosclerotic heart disease of native coronary artery without angina pectoris: Secondary | ICD-10-CM | POA: Insufficient documentation

## 2018-02-02 DIAGNOSIS — Z79899 Other long term (current) drug therapy: Secondary | ICD-10-CM | POA: Insufficient documentation

## 2018-02-02 DIAGNOSIS — R7989 Other specified abnormal findings of blood chemistry: Secondary | ICD-10-CM | POA: Diagnosis not present

## 2018-02-02 DIAGNOSIS — I252 Old myocardial infarction: Secondary | ICD-10-CM | POA: Diagnosis not present

## 2018-02-02 DIAGNOSIS — I2699 Other pulmonary embolism without acute cor pulmonale: Principal | ICD-10-CM | POA: Diagnosis present

## 2018-02-02 DIAGNOSIS — Z86718 Personal history of other venous thrombosis and embolism: Secondary | ICD-10-CM | POA: Insufficient documentation

## 2018-02-02 DIAGNOSIS — I110 Hypertensive heart disease with heart failure: Secondary | ICD-10-CM | POA: Diagnosis not present

## 2018-02-02 DIAGNOSIS — F329 Major depressive disorder, single episode, unspecified: Secondary | ICD-10-CM | POA: Insufficient documentation

## 2018-02-02 DIAGNOSIS — Z951 Presence of aortocoronary bypass graft: Secondary | ICD-10-CM | POA: Insufficient documentation

## 2018-02-02 DIAGNOSIS — K2210 Ulcer of esophagus without bleeding: Secondary | ICD-10-CM | POA: Diagnosis present

## 2018-02-02 DIAGNOSIS — I5033 Acute on chronic diastolic (congestive) heart failure: Secondary | ICD-10-CM | POA: Diagnosis not present

## 2018-02-02 DIAGNOSIS — Z85118 Personal history of other malignant neoplasm of bronchus and lung: Secondary | ICD-10-CM | POA: Insufficient documentation

## 2018-02-02 DIAGNOSIS — I509 Heart failure, unspecified: Secondary | ICD-10-CM | POA: Diagnosis not present

## 2018-02-02 DIAGNOSIS — E876 Hypokalemia: Secondary | ICD-10-CM | POA: Diagnosis not present

## 2018-02-02 DIAGNOSIS — D696 Thrombocytopenia, unspecified: Secondary | ICD-10-CM | POA: Diagnosis not present

## 2018-02-02 DIAGNOSIS — Z87891 Personal history of nicotine dependence: Secondary | ICD-10-CM | POA: Insufficient documentation

## 2018-02-02 DIAGNOSIS — J90 Pleural effusion, not elsewhere classified: Secondary | ICD-10-CM

## 2018-02-02 DIAGNOSIS — N400 Enlarged prostate without lower urinary tract symptoms: Secondary | ICD-10-CM | POA: Diagnosis not present

## 2018-02-02 DIAGNOSIS — E785 Hyperlipidemia, unspecified: Secondary | ICD-10-CM | POA: Diagnosis present

## 2018-02-02 DIAGNOSIS — I4892 Unspecified atrial flutter: Secondary | ICD-10-CM | POA: Diagnosis not present

## 2018-02-02 DIAGNOSIS — Z96653 Presence of artificial knee joint, bilateral: Secondary | ICD-10-CM | POA: Insufficient documentation

## 2018-02-02 DIAGNOSIS — K219 Gastro-esophageal reflux disease without esophagitis: Secondary | ICD-10-CM | POA: Diagnosis present

## 2018-02-02 DIAGNOSIS — Z955 Presence of coronary angioplasty implant and graft: Secondary | ICD-10-CM | POA: Insufficient documentation

## 2018-02-02 DIAGNOSIS — I480 Paroxysmal atrial fibrillation: Secondary | ICD-10-CM | POA: Diagnosis not present

## 2018-02-02 DIAGNOSIS — Z86711 Personal history of pulmonary embolism: Secondary | ICD-10-CM | POA: Diagnosis not present

## 2018-02-02 DIAGNOSIS — R0602 Shortness of breath: Secondary | ICD-10-CM | POA: Diagnosis not present

## 2018-02-02 DIAGNOSIS — Z947 Corneal transplant status: Secondary | ICD-10-CM | POA: Insufficient documentation

## 2018-02-02 DIAGNOSIS — I4891 Unspecified atrial fibrillation: Secondary | ICD-10-CM | POA: Diagnosis present

## 2018-02-02 DIAGNOSIS — C3491 Malignant neoplasm of unspecified part of right bronchus or lung: Secondary | ICD-10-CM | POA: Diagnosis present

## 2018-02-02 DIAGNOSIS — F411 Generalized anxiety disorder: Secondary | ICD-10-CM | POA: Diagnosis not present

## 2018-02-02 DIAGNOSIS — I959 Hypotension, unspecified: Secondary | ICD-10-CM | POA: Diagnosis present

## 2018-02-02 DIAGNOSIS — R778 Other specified abnormalities of plasma proteins: Secondary | ICD-10-CM | POA: Diagnosis present

## 2018-02-02 HISTORY — DX: Other pulmonary embolism without acute cor pulmonale: I26.99

## 2018-02-02 HISTORY — DX: Embolism and thrombosis of unspecified artery: I74.9

## 2018-02-02 LAB — RESPIRATORY PANEL BY PCR

## 2018-02-02 LAB — ECHOCARDIOGRAM COMPLETE
Height: 65 in
Weight: 2752 oz

## 2018-02-02 LAB — URINALYSIS, ROUTINE W REFLEX MICROSCOPIC
Bilirubin Urine: NEGATIVE
Glucose, UA: NEGATIVE mg/dL
Hgb urine dipstick: NEGATIVE
Ketones, ur: NEGATIVE mg/dL
Leukocytes, UA: NEGATIVE
Nitrite: NEGATIVE
Protein, ur: NEGATIVE mg/dL
Specific Gravity, Urine: 1.008 (ref 1.005–1.030)
pH: 6 (ref 5.0–8.0)

## 2018-02-02 LAB — HEPARIN LEVEL (UNFRACTIONATED): Heparin Unfractionated: 2.02 IU/mL — ABNORMAL HIGH (ref 0.30–0.70)

## 2018-02-02 LAB — BASIC METABOLIC PANEL
Anion gap: 8 (ref 5–15)
BUN: 13 mg/dL (ref 8–23)
CO2: 19 mmol/L — ABNORMAL LOW (ref 22–32)
Calcium: 8.4 mg/dL — ABNORMAL LOW (ref 8.9–10.3)
Chloride: 109 mmol/L (ref 98–111)
Creatinine, Ser: 1.17 mg/dL (ref 0.61–1.24)
GFR calc Af Amer: >60 mL/min (ref >60)
GFR calc non Af Amer: 54 mL/min — ABNORMAL LOW (ref >60)
Glucose, Bld: 114 mg/dL — ABNORMAL HIGH (ref 70–99)
Potassium: 3.2 mmol/L — ABNORMAL LOW (ref 3.5–5.1)
Sodium: 136 mmol/L (ref 135–145)

## 2018-02-02 LAB — CBC
HCT: 33.9 % — ABNORMAL LOW (ref 39.0–52.0)
Hemoglobin: 11.2 g/dL — ABNORMAL LOW (ref 13.0–17.0)
MCH: 31.2 pg (ref 26.0–34.0)
MCHC: 33 g/dL (ref 30.0–36.0)
MCV: 94.4 fL (ref 80.0–100.0)
Platelets: 86 10*3/uL — ABNORMAL LOW (ref 150–400)
RBC: 3.59 MIL/uL — ABNORMAL LOW (ref 4.22–5.81)
RDW: 13.6 % (ref 11.5–15.5)
WBC: 6.8 10*3/uL (ref 4.0–10.5)
nRBC: 0 % (ref 0.0–0.2)

## 2018-02-02 LAB — TROPONIN I
Troponin I: 0.03 ng/mL (ref <0.03)
Troponin I: 0.04 ng/mL (ref <0.03)
Troponin I: 0.03 ng/mL (ref <0.03)

## 2018-02-02 LAB — APTT: aPTT: 89 seconds — ABNORMAL HIGH (ref 24–36)

## 2018-02-02 LAB — I-STAT TROPONIN, ED: Troponin i, poc: 0.04 ng/mL (ref 0.00–0.08)

## 2018-02-02 LAB — BRAIN NATRIURETIC PEPTIDE: B Natriuretic Peptide: 605.2 pg/mL — ABNORMAL HIGH (ref 0.0–100.0)

## 2018-02-02 LAB — MAGNESIUM: Magnesium: 1.6 mg/dL — ABNORMAL LOW (ref 1.7–2.4)

## 2018-02-02 MED ORDER — LEFLUNOMIDE 20 MG PO TABS
10.0000 mg | ORAL_TABLET | Freq: Every day | ORAL | Status: DC
  Administered 2018-02-02 – 2018-02-03 (×2): 10 mg via ORAL
  Filled 2018-02-02 (×4): qty 0.5

## 2018-02-02 MED ORDER — ACETAMINOPHEN 650 MG RE SUPP: 650.0000 mg | Freq: Four times a day (QID) | RECTAL | Status: DC | PRN

## 2018-02-02 MED ORDER — AMOXICILLIN-POT CLAVULANATE 875-125 MG PO TABS
1.0000 | ORAL_TABLET | Freq: Two times a day (BID) | ORAL | Status: DC
  Administered 2018-02-02: 1 via ORAL
  Filled 2018-02-02: qty 1

## 2018-02-02 MED ORDER — ROSUVASTATIN CALCIUM 5 MG PO TABS
5.0000 mg | ORAL_TABLET | Freq: Every day | ORAL | Status: DC
  Administered 2018-02-02: 5 mg via ORAL
  Filled 2018-02-02 (×2): qty 1

## 2018-02-02 MED ORDER — LORAZEPAM 1 MG PO TABS
1.0000 mg | ORAL_TABLET | Freq: Three times a day (TID) | ORAL | Status: DC | PRN
  Administered 2018-02-02 – 2018-02-03 (×2): 1 mg via ORAL
  Filled 2018-02-02 (×2): qty 1

## 2018-02-02 MED ORDER — SODIUM CHLORIDE 0.9 % IV SOLN
INTRAVENOUS | Status: DC
  Administered 2018-02-02 – 2018-02-03 (×2): via INTRAVENOUS

## 2018-02-02 MED ORDER — IOPAMIDOL (ISOVUE-370) INJECTION 76%
INTRAVENOUS | Status: AC
  Filled 2018-02-02: qty 100

## 2018-02-02 MED ORDER — IOPAMIDOL (ISOVUE-370) INJECTION 76%
100.0000 mL | Freq: Once | INTRAVENOUS | Status: AC | PRN
  Administered 2018-02-02: 100 mL via INTRAVENOUS

## 2018-02-02 MED ORDER — SODIUM CHLORIDE 0.9% FLUSH: 3.0000 mL | INTRAVENOUS | Status: DC | PRN

## 2018-02-02 MED ORDER — APIXABAN 5 MG PO TABS: 5.0000 mg | ORAL_TABLET | Freq: Two times a day (BID) | ORAL | Status: DC

## 2018-02-02 MED ORDER — LEVALBUTEROL HCL 0.63 MG/3ML IN NEBU: 0.6300 mg | INHALATION_SOLUTION | RESPIRATORY_TRACT | Status: DC | PRN

## 2018-02-02 MED ORDER — IPRATROPIUM-ALBUTEROL 0.5-2.5 (3) MG/3ML IN SOLN: 3.0000 mL | RESPIRATORY_TRACT | Status: DC | PRN

## 2018-02-02 MED ORDER — MAGNESIUM OXIDE 400 (241.3 MG) MG PO TABS
400.0000 mg | ORAL_TABLET | Freq: Two times a day (BID) | ORAL | Status: DC
  Administered 2018-02-02 – 2018-02-03 (×3): 400 mg via ORAL
  Filled 2018-02-02 (×3): qty 1

## 2018-02-02 MED ORDER — LIFITEGRAST 5 % OP SOLN: 1.0000 [drp] | Freq: Every day | OPHTHALMIC | Status: DC

## 2018-02-02 MED ORDER — SODIUM CHLORIDE 0.9% FLUSH: 3.0000 mL | Freq: Two times a day (BID) | INTRAVENOUS | Status: DC

## 2018-02-02 MED ORDER — FUROSEMIDE 10 MG/ML IJ SOLN
20.0000 mg | Freq: Once | INTRAMUSCULAR | Status: AC
  Administered 2018-02-02: 20 mg via INTRAVENOUS
  Filled 2018-02-02: qty 2

## 2018-02-02 MED ORDER — MORPHINE SULFATE (PF) 4 MG/ML IV SOLN
4.0000 mg | Freq: Once | INTRAVENOUS | Status: AC
  Administered 2018-02-02: 4 mg via INTRAVENOUS
  Filled 2018-02-02: qty 1

## 2018-02-02 MED ORDER — SODIUM CHLORIDE 0.9 % IV SOLN: 250.0000 mL | INTRAVENOUS | Status: DC | PRN

## 2018-02-02 MED ORDER — ACETAMINOPHEN 325 MG PO TABS
650.0000 mg | ORAL_TABLET | Freq: Four times a day (QID) | ORAL | Status: DC | PRN
  Administered 2018-02-02: 650 mg via ORAL
  Filled 2018-02-02: qty 2

## 2018-02-02 MED ORDER — HEPARIN (PORCINE) IN NACL 100-0.45 UNIT/ML-% IJ SOLN
1250.0000 [IU]/h | INTRAMUSCULAR | Status: DC
  Administered 2018-02-02 – 2018-02-03 (×2): 1250 [IU]/h via INTRAVENOUS
  Filled 2018-02-02 (×2): qty 250

## 2018-02-02 MED ORDER — HYDROCODONE-ACETAMINOPHEN 5-325 MG PO TABS: 1.0000 | ORAL_TABLET | ORAL | Status: DC | PRN

## 2018-02-02 MED ORDER — FUROSEMIDE 10 MG/ML IJ SOLN: 20.0000 mg | Freq: Two times a day (BID) | INTRAMUSCULAR | Status: DC

## 2018-02-02 MED ORDER — PANTOPRAZOLE SODIUM 40 MG PO TBEC
40.0000 mg | DELAYED_RELEASE_TABLET | Freq: Every day | ORAL | Status: DC
  Administered 2018-02-02 – 2018-02-03 (×2): 40 mg via ORAL
  Filled 2018-02-02 (×2): qty 1

## 2018-02-02 MED ORDER — ONDANSETRON HCL 4 MG PO TABS: 4.0000 mg | ORAL_TABLET | Freq: Four times a day (QID) | ORAL | Status: DC | PRN

## 2018-02-02 MED ORDER — TAMSULOSIN HCL 0.4 MG PO CAPS
0.4000 mg | ORAL_CAPSULE | Freq: Every day | ORAL | Status: DC
  Administered 2018-02-02 – 2018-02-03 (×2): 0.4 mg via ORAL
  Filled 2018-02-02 (×2): qty 1

## 2018-02-02 MED ORDER — FLUOXETINE HCL 20 MG PO CAPS
20.0000 mg | ORAL_CAPSULE | Freq: Every day | ORAL | Status: DC
  Administered 2018-02-02 – 2018-02-03 (×2): 20 mg via ORAL
  Filled 2018-02-02 (×2): qty 1

## 2018-02-02 MED ORDER — ONDANSETRON HCL 4 MG/2ML IJ SOLN: 4.0000 mg | Freq: Four times a day (QID) | INTRAMUSCULAR | Status: DC | PRN

## 2018-02-02 MED ORDER — MAGNESIUM OXIDE 400 (241.3 MG) MG PO TABS: 400.0000 mg | ORAL_TABLET | Freq: Every day | ORAL | Status: DC

## 2018-02-02 MED ORDER — POTASSIUM CHLORIDE CRYS ER 20 MEQ PO TBCR
40.0000 meq | EXTENDED_RELEASE_TABLET | Freq: Once | ORAL | Status: AC
  Administered 2018-02-02: 40 meq via ORAL
  Filled 2018-02-02: qty 2

## 2018-02-02 NOTE — ED Triage Notes (Signed)
Pt was discharged on Thursday, states he has left sided chest and SOB that woke him up out of sleep.

## 2018-02-02 NOTE — ED Notes (Signed)
Patient transported to X-ray 

## 2018-02-02 NOTE — ED Notes (Signed)
Breakfast tray ordered 

## 2018-02-02 NOTE — Progress Notes (Signed)
ANTICOAGULATION CONSULT NOTE - Follow Up Consult  Pharmacy Consult for Heparin Indication: pulmonary embolus  Allergies  Allergen Reactions  . Feldene [Piroxicam] Other (See Comments)    REACTION: Blisters  . Statins Other (See Comments)    Myalgias  . Doxycycline Other (See Comments)    REACTION:  unknown  . Latex Rash  . Tequin Anxiety  . Valium Other (See Comments)    REACTION:  unknown    Patient Measurements: Height: 5\' 5"  (165.1 cm) Weight: 172 lb (78 kg) IBW/kg (Calculated) : 61.5  Vital Signs: Temp: 97.8 F (36.6 C) (11/01 0516) Temp Source: Oral (11/01 0516) BP: 125/84 (11/01 1000) Pulse Rate: 69 (11/01 1000)  Labs: Recent Labs    01/30/18 1704 01/31/18 1146 02/02/18 0537 02/02/18 0854  HGB 12.1* 11.8* 11.2*  --   HCT 35.8* 37.0* 33.9*  --   PLT 91* 80* 86*  --   CREATININE 1.66* 1.33* 1.17  --   TROPONINI  --   --   --  0.03*    Estimated Creatinine Clearance: 42 mL/min (by C-G formula based on SCr of 1.17 mg/dL).   Assessment: 84 yo male on Eliquis PTA for a fib and h/o DVT with non-compliance issues (last dose in pm on 10/31). Pharmacy consulted for heparin therapy.  Goal of Therapy:  Heparin level 0.3-0.7 units/ml  APTT 66 - 102 sec Monitor platelets by anticoagulation protocol: Yes   Plan:  Start heparin infusion at 1250 units/hr  Due to recent Eliquis dose, will monitor aPTT and HL  Alanda Slim, PharmD, [facility] Clinical Pharmacist Please see AMION for all Pharmacists' Contact Phone Numbers 02/02/2018, 11:24 AM

## 2018-02-02 NOTE — H&P (Signed)
[name]:[id number] DOB: April 15, 1927 DOA: 02/02/2018     PCP: [name], MD   Outpatient Specialists:   CARDS:   Dr. Irish Lack [facility]monary   Dr. Curt [facility]   Patient arrived to ER on 02/02/18 at 0508  Patient coming from: home Lives   With family    Chief Complaint:  Chief Complaint  Patient presents with  . Shortness of Breath  . Chest Pain    HPI: Evan [name] is a 84 y.o. male with medical history significant of CAD status post CABG x2 in 1998 and cath in 2009 showed patent stents and grafts.Low risk nuclear stress test 2017, history of lung cancer status post radiation therapy, history of atrial fibrillation, BPH, esophageal ulcer on PPI, GERD HLD, HTN, RA History of small subdural hematoma secondary to fall in 2018  Presented with left-sided chest pain shortness of breath that woke him up he presented to emergency department. Patient actually states he never completely went to seep due to ongoing chest tightness and discomfort with increased cough and increased mucous production. Chest pain non positional but it does hurt more with palpation and deep breaths. He reports chest pain a bit worse with cough. He has ongoing sensation of dyspnea and chest tightness that does not change with cough or position.  When he lays down flat he gets more short of breath.  He took nitro at home It helped a bit took nebulizer it helped  A bit too. Pt states he has misses a few doses of his eliquis    in September 2018 patient was hospitalized with pericarditis at that time 2D echo showed normal EF mild left ear no pericardial effusion.  He was treated with high-dose aspirin and colchicine    Recently seen in the office for fever up to 102.9 but oral thermometer showed 98.8 felt to be in correct He was hypotensive down to 98/60 given normal saline 500 bolus. Reported recent cough has been getting worse. Has been getting overall weaker and more debilitated  recently no sore throat. He was given Rocephin IM on 29 October and started on Z-Pak Note weight noted to be up to 182 pounds Influenza A and B was tested and was negative. WBC 10.5 Hg 12.1 Cr up to 1.66 Was sent to emergency department 30 October and noted to be improved to 1.33 6 or thought to be stable no infiltrate, no UTI no cellulitis was noted He was switched to Augmentin from Zithromax and given albuterol felt to be secondary to bronchitis  He initially did well when he went home but today developed left-sided chest pain or shortness of breath that woke him up out of his sleep.  Regarding pertinent Chronic problems:  atrial fibrillation rate controlled CHA2DS2-VASc score of 4  on Eliquis 5 mg twice daily Latest cardiac catheterization showing:  multi vessel native CAD with total occlusion of the very proximal LAD; 75% ostial stenosis of the OM1 vessel with a patent LCX stent between the OM1 and OM 2 vessels, and 65-70% ostial narrowing at the origin of the OM 2 vessel; dominant RCA with 30% proximal and 10% mid stenosis. Widely patent free radial graft supplying the OM1 vessel with a patent ostial proximal stent in the graft. Patent LIMA graft supplying the LAD system  History of lung cancer status post radiation therapy last CT was 01/04/2018 showing   Masslike architectural distortion within the right base is again identified likely reflecting changes secondary to  external beam radiation. No specific findings identified to suggest residual or recurrence of tumor. 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  The following Work up has been ordered so far:  Orders Placed This Encounter  Procedures  . DG Chest 2 View  . Basic metabolic panel  . CBC  . Brain natriuretic peptide  . Cardiac monitoring  . Saline Lock IV, Maintain IV access  . Cardiac monitoring  . Consult to hospitalist  . Consult to hospitalist  . Pulse oximetry, continuous  . I-stat troponin, ED  . EKG 12-Lead  . EKG 12-Lead  . ED EKG within 10 minutes  . Place in observation (patient's expected length of stay will be less than 2 midnights)     Following Medications were ordered in ER: Medications  morphine 4 MG/ML injection 4 mg (4 mg Intravenous Given 02/02/18 0650)  furosemide (LASIX) injection 20 mg (20 mg Intravenous Given 02/02/18 0645)    Significant initial  Findings: Abnormal Labs Reviewed  BASIC METABOLIC PANEL - Abnormal; Notable for the following components:      Result Value   Potassium 3.2 (*)    CO2 19 (*)    Glucose, Bld 114 (*)    Calcium 8.4 (*)    GFR calc non Af Amer 54 (*)    All other components within normal limits  CBC - Abnormal; Notable for the following components:   RBC 3.59 (*)    Hemoglobin 11.2 (*)    HCT 33.9 (*)    Platelets 86 (*)    All other components within normal limits  BRAIN NATRIURETIC PEPTIDE - Abnormal; Notable for the following components:   B Natriuretic Peptide 605.2 (*)    All other components within normal limits     Na  136 K 3.2  Cr dwon from baseline see below Lab Results  Component Value Date   CREATININE 1.17 02/02/2018   CREATININE 1.33 (H) 01/31/2018   CREATININE 1.66 (H) 01/30/2018    trop 0.04  WBC  6.8  HG/HCT stable,       Component Value Date/Time   HGB 11.2 (L) 02/02/2018 0537   HGB 13.4 08/16/2016 1155   HCT 33.9 (L) 02/02/2018 0537   HCT 39.8 08/16/2016 1155     PLT 86 chronically  low  Troponin (Point of Care Test) Recent Labs    02/02/18 0554  TROPIPOC 0.04      BNP (last 3 results) Recent Labs    05/11/17 0352 12/02/17 1039 02/02/18 0537  BNP 454.7* 629.1* 605.2*    ProBNP (last 3 results) Recent Labs    06/14/17 1010 06/21/17 1617  PROBNP 823.0* 666.0*    Lactic Acid, Venous    Component Value Date/Time   LATICACIDVEN 1.88 01/31/2018 1504      UA   ordered     CXR -cardiomegaly with peripheral vascular congestion small bilateral pleural effusions left greater than right  CTchest showing new PE  ECG:  Personally reviewed  by me showing: HR : 69 Rhythm:  NSR, poor baseline  no evidence of ischemic changes QTC 518     ED Triage Vitals  Enc Vitals Group     BP 02/02/18 0516 100/66     Pulse Rate 02/02/18 0516 68     Resp 02/02/18 0516 20     Temp 02/02/18 0516 97.8 F (36.6 C)     Temp Source 02/02/18 0516 Oral     SpO2 02/02/18 0516 100 %     Weight 02/02/18 0519 172 lb (78 kg)     Height 02/02/18 0519 5\' 5"  (1.651 m)     Head Circumference --      Peak Flow --      Pain Score 02/02/18 0518 5     Pain Loc --      Pain Edu? --      Excl. in [place]? --   TMAX(24)@       Latest  Blood pressure (!) 151/85, pulse 68, temperature 97.8 F (36.6 C), temperature source Oral, resp. rate (!) 28, height 5\' 5"  (1.651 m), weight 78 kg, SpO2 98 %.     THis Provider Called: Oncology    Dr. Marin Olp They Recommend heparin for 24 hours and switch to Lovenox to go home with and follow-up with Dr. Julien Nordmann  Hospitalist was called for admission for ongoing chest pain   Review of Systems:    Pertinent positives include: Fevers, chills, fatigue, chest pain,   Constitutional:  No weight loss, night sweats,  weight loss  HEENT:  No headaches, Difficulty swallowing,Tooth/dental problems,Sore throat,  No sneezing, itching, ear ache, nasal congestion, post nasal drip,  Cardio-vascular:  No Orthopnea, PND, anasarca, dizziness,  palpitations.no Bilateral lower extremity swelling  GI:  No heartburn, indigestion, abdominal pain, nausea, vomiting, diarrhea, change in bowel habits, loss of appetite, melena, blood in stool, hematemesis Resp:  no shortness of breath at rest. No dyspnea on exertion, No excess mucus, no productive cough, No non-productive cough, No coughing up of blood.No change in color of mucus.No wheezing. Skin:  no rash or lesions. No jaundice GU:  no dysuria, change in color of urine, no urgency or frequency. No straining to urinate.  No flank pain.  Musculoskeletal:  No joint pain or no joint swelling. No decreased range of motion. No back pain.  Psych:  No change in mood or affect. No depression or anxiety. No memory loss.  Neuro: no localizing neurological complaints, no tingling, no weakness, no double vision, no gait abnormality, no slurred speech, no confusion  All systems reviewed and apart from [place] all are negative  Past Medical History:   Past Medical History:  Diagnosis Date  . Adenocarcinoma of right lung ([place]) 01/06/2016  . Anginal chest pain at rest [facility])    Chronic non, controlled on Lorazepam  . Anxiety   . Arthritis    "all over"   . BPH (benign prostatic hyperplasia)   . CAD (coronary artery disease)   . Chronic lower back pain   . Complication of anesthesia    "problems making water afterwards"  . Depression   . DJD (degenerative joint disease)   . Esophageal ulcer without bleeding    bid ppi indefinitely  . Family history of adverse reaction to anesthesia    "children w/PONV"  . GERD (gastroesophageal reflux disease)   . Headache    "couple/week maybe" (09/04/2015)  . Hemochromatosis    Possible, elevated iron stores, hook-like osteophytes on hand films, normal  LFTs  . Hepatitis    "yellow jaundice as a baby"  . History of ASCVD    MULTIVESSEL  . Hyperlipidemia   . Hypertension   . Myocardial infarction ([place]) 1999  . Osteoarthritis   . Pneumonia    "several  times; got a little now" (09/04/2015)  . Rheumatoid arthritis ([place])   . Subdural hematoma (HCC)    small after fall 09/2016-plavix held, neurosurgery consulted.  Asymptomatic.        Past Surgical History:  Procedure Laterality Date  . ARM SKIN LESION BIOPSY / EXCISION Left 09/02/2015  . CATARACT EXTRACTION W/ INTRAOCULAR LENS  IMPLANT, BILATERAL Bilateral   . CHOLECYSTECTOMY OPEN    . CORNEAL TRANSPLANT Bilateral    "[facility] at [facility]; one at [facility]"  . CORONARY ANGIOPLASTY WITH STENT PLACEMENT    . CORONARY ARTERY BYPASS GRAFT  1999  . DESCENDING AORTIC ANEURYSM REPAIR W/ STENT     DE stent ostium into the right radial free graft at OM-, 02-2007  . ESOPHAGOGASTRODUODENOSCOPY N/A 11/09/2014   Procedure: ESOPHAGOGASTRODUODENOSCOPY (EGD);  Surgeon: [name], MD;  Location: [facility] ENDOSCOPY;  Service: Endoscopy;  Laterality: N/A;  . INGUINAL HERNIA REPAIR    . JOINT REPLACEMENT    . LEFT HEART CATH AND CORS/GRAFTS ANGIOGRAPHY N/A 06/27/2017   Procedure: LEFT HEART CATH AND CORS/GRAFTS ANGIOGRAPHY;  Surgeon: [name], MD;  Location: [facility];  Service: Cardiovascular;  Laterality: N/A;  . NASAL SINUS SURGERY    . SHOULDER OPEN ROTATOR CUFF REPAIR Right   . TOTAL KNEE ARTHROPLASTY Bilateral     Social History:  Ambulatory   independently    reports that he has never smoked. He has quit using smokeless tobacco.  His smokeless tobacco use included chew. He reports that he does not drink alcohol or use drugs.     Family History:   Family History  Problem Relation Age of Onset  . Arthritis-Osteo Sister   . Heart attack Brother   . Heart attack Other   . Cancer Neg Hx     Allergies: Allergies  Allergen Reactions  . Feldene [Piroxicam] Other (See Comments)    REACTION: Blisters  . Statins Other (See Comments)    Myalgias  . Doxycycline Other (See Comments)    REACTION:  unknown  . Latex Rash  . Tequin Anxiety  . Valium Other (See Comments)    REACTION:  unknown      Prior to Admission medications   Medication Sig Start Date End Date Taking? Authorizing Provider  amoxicillin-clavulanate (AUGMENTIN) 875-125 MG tablet Take 1 tablet by mouth every 12 (twelve) hours. 01/31/18  Yes Pattricia Boss, MD  Cholecalciferol 2000 units TABS Take 1 tablet (2,000 Units total) by mouth daily. 02/04/16  Yes Pickard, Cammie Mcgee, MD  ELIQUIS 5 MG TABS tablet TAKE ONE (1) TABLET BY MOUTH TWO (2) TIMES DAILY 02/01/18  Yes Jettie Booze, MD  ENSURE (ENSURE) Take 237 mLs by mouth 2 (two) times daily between meals.   Yes [provider]  FLUoxetine (PROZAC) 20 MG capsule TAKE ONE CAPSULE BY MOUTH DAILY Patient taking differently: Take 20 mg by mouth daily.  02/01/18  Yes [name], MD  furosemide (LASIX) 20 MG tablet Take 1 tablet (20 mg total) once a day. Take an extra tablet (20 mg total) for increased swelling, weight gain, or shortness of breath. Patient taking differently: Take 20 mg by mouth daily. May take an extra tablet (20 mg total) for increased swelling,  weight gain, or shortness of breath. 01/16/18  Yes Jettie Booze, MD  ipratropium-albuterol (DUONEB) 0.5-2.5 (3) MG/3ML SOLN Take 3 mLs by nebulization every 4 (four) hours as needed (or Chest Pain / Tightness). 12/23/16  Yes Kilroy, Luke K, PA-C  leflunomide (ARAVA) 10 MG tablet Take 10 mg by mouth daily. 06/30/17  Yes [provider]  Lifitegrast Shirley Friar) 5 % SOLN Place 1-2 drops into both eyes daily.    Yes [provider]  LORazepam (ATIVAN) 1 MG tablet TAKE ONE TABLET BY MOUTH EVERY EIGHT HOURS AS NEEDED FOR ANXIETY Patient taking differently: Take 1 mg by mouth every 8 (eight) hours as needed for anxiety.  02/01/18  Yes [name], MD  metoprolol succinate (TOPROL-XL) 25 MG 24 hr tablet TAKE ONE (1) TABLET BY MOUTH EVERY DAY Patient taking differently: Take 25 mg by mouth daily.  09/11/17  Yes Jettie Booze, MD  nitroGLYCERIN (NITROSTAT) 0.4 MG SL tablet  Place 1 tablet (0.4 mg total) under the tongue every 5 (five) minutes as needed for chest pain. 12/06/17  Yes Daune Perch, NP  pantoprazole (PROTONIX) 40 MG tablet Take one (1) tablet (40 mg) by mouth daily. Patient taking differently: Take 40 mg by mouth daily.  02/21/17  Yes Jettie Booze, MD  rosuvastatin (CRESTOR) 5 MG tablet TAKE 5 mg  EVERY DAY Patient taking differently: Take 5 mg by mouth daily.  07/07/17  Yes Jettie Booze, MD  tamsulosin (FLOMAX) 0.4 MG CAPS capsule TAKE 1 CAPSULE EVERY DAY Patient taking differently: Take 0.4 mg by mouth daily.  10/18/17  Yes [name], MD   Physical Exam: Blood pressure (!) 151/85, pulse 68, temperature 97.8 F (36.6 C), temperature source Oral, resp. rate (!) 28, height 5\' 5"  (1.651 m), weight 78 kg, SpO2 98 %. 1. General:  in No Acute distress   Chronically ill -appearing 2. Psychological: Alert and   3. Head/ENT:   Moist  Mucous Membranes                          Head Non traumatic, neck supple                         Poor Dentition 4. SKIN: normal  Skin turgor,  Skin clean Dry and intact no rash 5. Heart: Regular rate and rhythm no Murmur, no Rub or gallop 6. Lungs:  no wheezes some  Crackles WORSE ON THE RIGHT   7. Abdomen: Soft,  non-tender, Non distended  bowel sounds present 8. Lower extremities: no clubbing, cyanosis, or  edema 9. Neurologically Grossly intact, moving all 4 extremities equally   10. MSK: Normal range of motion   LABS:     Recent Labs  Lab 01/30/18 1704 01/31/18 1146 02/02/18 0537  WBC 10.5 8.5 6.8  NEUTROABS 8,201* 6.4  --   HGB 12.1* 11.8* 11.2*  HCT 35.8* 37.0* 33.9*  MCV 91.8 96.9 94.4  PLT 91* 80* 86*   Basic Metabolic Panel: Recent Labs  Lab 01/30/18 1704 01/31/18 1146 02/02/18 0537  NA 140 138 136  K 4.5 3.5 3.2*  CL 106 108 109  CO2 18* 20* 19*  GLUCOSE 123* 112* 114*  BUN 28* 21 13  CREATININE 1.66* 1.33* 1.17  CALCIUM 9.0 8.7* 8.4*      Recent Labs  Lab  01/30/18 1704 01/31/18 1146  AST 24 23  ALT 13 16  ALKPHOS  --  105  BILITOT 1.3* 2.0*  PROT 6.3 6.1*  ALBUMIN  --  3.4*   No results for input(s): LIPASE, AMYLASE in the last 168 hours. No results for input(s): AMMONIA in the last 168 hours.    HbA1C: No results for input(s): HGBA1C in the last 72 hours. CBG: No results for input(s): GLUCAP in the last 168 hours.    Urine analysis:    Component Value Date/Time   COLORURINE YELLOW 01/31/2018 [place] 01/31/2018 1112   LABSPEC 1.017 01/31/2018 1112   PHURINE 5.0 01/31/2018 1112   GLUCOSEU NEGATIVE 01/31/2018 1112   HGBUR NEGATIVE 01/31/2018 1112   BILIRUBINUR NEGATIVE 01/31/2018 1112   KETONESUR NEGATIVE 01/31/2018 1112   PROTEINUR NEGATIVE 01/31/2018 1112   UROBILINOGEN 0.2 07/13/2014 1804   NITRITE NEGATIVE 01/31/2018 1112   LEUKOCYTESUR NEGATIVE 01/31/2018 1112      Cultures:    Component Value Date/Time   SDES BLOOD RIGHT WRIST 01/31/2018 1325   SPECREQUEST  01/31/2018 1325    BOTTLES DRAWN AEROBIC AND ANAEROBIC Blood Culture adequate volume   CULT  01/31/2018 1325    NO GROWTH 1 DAY Performed at [facility], [place] [address], [place], [place] [address]    REPTSTATUS PENDING 01/31/2018 1325     Radiological Exams on Admission: Dg Chest 2 View  Result Date: 02/02/2018 CLINICAL DATA:  Initial evaluation for acute chest pain, shortness of breath. EXAM: CHEST - 2 VIEW COMPARISON:  Prior radiograph from 01/31/2018. FINDINGS: Median sternotomy wires noted. Underlying cardiomegaly, stable. Mediastinal silhouette normal. Aortic atherosclerosis. Lungs mildly hypoinflated. Streaky bibasilar linear opacities favored to reflect atelectatic changes. No consolidative airspace opacity. Veiling opacities overlying the bilateral lung bases likely reflect small layering pleural effusions, left greater than right. Perihilar vascular congestion without overt pulmonary edema. No pneumothorax. No acute osseus  abnormality. IMPRESSION: 1. Cardiomegaly with mild perihilar vascular congestion and small layering bilateral pleural effusions, left greater than right. 2. Superimposed bibasilar atelectatic changes. 3. Aortic atherosclerosis. Electronically Signed   By: Jeannine Boga M.D.   On: 02/02/2018 06:31   Dg Chest 2 View  Result Date: 01/31/2018 CLINICAL DATA:  Productive cough for the past 2-3 weeks. History of lung cancer. EXAM: CHEST - 2 VIEW COMPARISON:  CT chest dated January 04, 2018. Chest x-ray dated December 02, 2017. FINDINGS: Stable mild cardiomegaly status post CABG. Coarsened interstitial markings are similar to prior studies. Bibasilar atelectasis/scarring is unchanged. No focal consolidation, pleural effusion, or pneumothorax. No acute osseous abnormality. IMPRESSION: No active cardiopulmonary disease. Electronically Signed   By: Titus Dubin M.D.   On: 01/31/2018 11:46   Ct Angio Chest Pe W Or Wo Contrast  Result Date: 02/02/2018 CLINICAL DATA:  Chest pain, shortness of breath. History of right lung cancer. EXAM: CT ANGIOGRAPHY CHEST WITH CONTRAST TECHNIQUE: Multidetector CT imaging of the chest was performed using the standard protocol during bolus administration of intravenous contrast. Multiplanar CT image reconstructions and MIPs were obtained to evaluate the vascular anatomy. CONTRAST:  116mL ISOVUE-370 IOPAMIDOL (ISOVUE-370) INJECTION 76% COMPARISON:  Radiographs of same day.  CT scan of January 04, 2018. FINDINGS: Cardiovascular: Atherosclerosis of thoracic aorta is noted without aneurysm formation. Filling defect is seen in peripheral lower lobe branch of right pulmonary artery concerning for pulmonary embolus. Mild cardiomegaly is noted no pericardial effusion is noted. Status post coronary artery bypass graft. Mediastinum/Nodes: Thyroid gland and esophagus are unremarkable. 15 mm right paratracheal lymph node is noted which is enlarged compared to prior exam. Stable  small right  hilar lymph node is noted. Stable 1 cm subcarinal lymph node is noted. Lungs/Pleura: Minimal bilateral pleural effusions are noted. No pneumothorax is noted. Stable right posterior basilar opacity is noted which may represent atelectasis, infiltrate or possibly radiation fibrosis. Stable right upper lobe scarring is noted. Upper Abdomen: No acute abnormality. Musculoskeletal: No chest wall abnormality. No acute or significant osseous findings. Review of the MIP images confirms the above findings. IMPRESSION: Acute embolus is noted in peripheral lower lobe branch of right pulmonary artery. Critical Value/emergent results were called by telephone at the time of interpretation on 02/02/2018 at 10:59 am to Dr. Toy Baker , who verbally acknowledged these results. Right paratracheal lymph node measures 15 mm which is enlarged compared to prior exam; metastatic disease cannot be excluded. Other mediastinal lymph nodes are unchanged. Minimal bilateral pleural effusions are noted. Stable right posterior basilar opacity is noted which may represent atelectasis, infiltrate or possibly radiation fibrosis. Aortic Atherosclerosis (ICD10-I70.0). Electronically Signed   By: Marijo Conception, M.D.   On: 02/02/2018 10:59    Chart has been reviewed    Assessment/Plan   84 y.o. male with medical history significant of CAD status post CABG x2 in 1998 and cath in 2009 showed patent stents and grafts.Low risk nuclear stress test 2017, history of lung cancer status post radiation therapy, history of atrial fibrillation, BPH, esophageal ulcer on PPI, GERD HLD, HTN, RA History of small subdural hematoma secondary to fall in 2018 Admitted for new diagnosis of PE  Present on Admission: . Chest pain -/chest tightness CT chest worrisome for PE. Switch to heparin Emailed cardiology given also history of extensive coronary artery disease and chest pain being more to the left with a right-sided PE . Pulmonary embolus (HCC)  patient with prior history of PE DVT with known history of malignancy. Chest discomfort/shortness of breath and chest tightness possibly secondary to pulmonary embolism Although other etiologies are a possibility  he has been on Eliquis he did skip couple of doses secondary to having to being in the emergency department lately as he only skipped one dose it is unlikely that he failed Eliquis he will will need different anticoagulation going forward would appreciate oncology consult input as patient has been followed by them in the past. Spoke with Dr. Marin Olp recommended switching to Lovenox  after 24h of Iv heparin and have pt follow up with Dr. Earlie Server. Called Dr. Earlie Server office left a message for the nurse at 11:52 AM Also will email Dr. Earlie Server   . GERD (gastroesophageal reflux disease) -stable he will continue Protonix . Hyperlipidemia -stable continue statin check lipid panel . Hypertension -given new diagnosis of PE and somewhat soft blood pressures currently will hold off on metoprolol restart when able to tolerate . Rheumatoid arthritis continue home medications currently stable . BPH (benign prostatic hyperplasia) currently stable continue Flomax . Esophageal ulcer without bleeding on Protonix continue   . Adenocarcinoma of right lung ([place])  -we will need to notify oncology of the patient has been admitted Page Dr. Marin Olp   recommendchedule follow up with Dr.Mohamed Chest CT showing large enlargement of right peritracheal lymph node could be metastatic disease from known history of lung cancer will need to follow-up with oncology . Atrial fibrillation ([place]) -          - CHA2DS2 vas score 4: continue  anticoagulation given new diagnosis of PE will switch to heparin        -  Rate control:  Toprolol on hold for now new diagnosis of PE and systolic blood pressures currently appears to be in sinus         . Pleurisy likely secondary to PE   . Thrombocytopenia ([place]) chronic watch  for any signs of bleeding given need for anticoagulation in the setting of new diagnosis of pulmonary embolism  . Hypokalemia - - will replace and repeat in AM,  check magnesium level and replace as needed   . Elevated troponin -in the setting of pulmonary embolism but also known history of CAD Continue to monitor cardiac enzymes obtain echogram Also noted to have elevated BNP  History of diastolic CHF -initial diagnosis was CHF exacerbation but CT chest did not confirm evidence of fluid overload rather new diagnosis of PE patient did receive a dose of Lasix in the emergency department from now we will hold given somewhat soft blood pressures he would benefit from gentle IV fluids   Other plan as per orders.  DVT prophylaxis:  eliquis  Change to heparin due to new diagnosis of PE  Code Status:  FULL CODE as per patient    I had personally discussed CODE STATUS with patient and family   Family Communication:   Family at  Bedside  plan of care was discussed with , Daughter,   Disposition Plan:   To home once workup is complete and patient is stable                     Would benefit from PT/OT eval prior to DC  Ordered                                     Consults called: emailed cardiology, spoke with  Oncology   Admission status:   Obs    Level of care        SDU tele indefinitely please discontinue once patient no longer qualifies      Mykeisha Dysert 02/02/2018, 9:42 AM    [facility]  Pager [phone]   after 2 AM please page floor coverage PA If 7AM-7PM, please contact the day team taking care of the patient  [email or web address]  Password [email or web address]

## 2018-02-02 NOTE — Consult Note (Addendum)
Cardiology Consult    Patient ID: Evan [name] MRN: [id number], DOB/AGE: [birth year]   Admit date: 02/02/2018 Date of Consult: 02/02/2018  Primary Physician: [name], MD Primary Cardiologist: Larae Grooms, MD Requesting Provider: Toy Baker, MD  Patient Profile    Evan [name] is a 84 y.o. male with a history of CAD s/p CABG x2 in 1998 with patent stents and grafts on cardiac catheterization in 06/2017, atrial fibrillation/flutter on Eliquis, chronic diastolic heart failure, and lung cancer, who is being seen today for the evaluation of chest pain at the request of Dr. Roel Cluck.  History of Present Illness    Evan [name] is a 84 year old Caucasian male with the above history who is followed by Dr. Irish Lack. He was seen by Pulmonology in 06/2017 and they were concerned for unstable angina. He was admitted and underwent a cardiac catheterization which showed multivessel native CAD patent stents and grafts. He presented to the ED on 12/02/2017 for shortness of breath and was felt to be having a heart failure exacerbation at that time. He was discharged and was advised to restart his Lasix and follow-up with his Cardiologist. He last saw Dr. Irish Lack on 01/16/2018 at which time he was doing relatively well. He presented to the ED earlier this week on 01/31/2018 complaining of cough, fever, shortness of breath, and weakness. He was diagnosed with bronchitis, started on Augmentin, and discharged.    Patient returns to the ED today for left sided chest pain and shortness of breath that woke him up from sleep. The pain started around 12pm and persisted on and off for about 4-5 hours. He describes it has a tightness and ranks it as 4/10 on the pain scale. Pain is non-radiating and is worse when he is lying flat on his back. He reports some improvement when he props himself up on his side. Patient did take nitroglycerin around 2-3am with some improvement but no full relief. Patient reports  associated shortness of breath and states the shortness of breath actually started before the chest pain. He denies any associated diaphoresis, vision changes, palpitations, lightheadedness, or dizziness.  Patient reports recent exertional chest pain and dyspnea over the past few weeks. Patient states chest pain feels similar to when he was diagnosed with pericarditis about 1 year ago. He does report fevers and chills earlier this week when he was diagnosed with bronchitis. He has not noticed much change since starting the antibiotic.   Patient does have a history of diastolic heart failure. He reports some orthopnea and PND over the last 6 months. He daughter states that he has not been taking the extra dose of Lasix as needed like Dr. Irish Lack recommended even though she thinks he as need it at time. She also states that he has not been watching his salt intake very closely. He does not weight himself daily.    Upon arrival to the ED, vital signs table. EKG showed normal sinus rhythm, rate 69 bpm, with nonspecific T wave changes . I-stat troponin negative at 0.04. Chest x-ray showed cardiomegaly with mild perihilar vascular congestion and small layering bilateral pleural effusion (L > R), superimposed bibasilar atelectatic changes, and aortic atherosclerosis. BNP elevated at 605.2. WBC 6.8, Hgb 11.2, Plts 86. Na 136, K 3.2, Glucose 114, SCr 1.17. Patient received IV Lasix 20mg  and IV morphine 4mg  in the ED. Patient admitted for further workup.  Currently, patient denies any chest pain but still reports some shortness of breath with no  major improvement since receiving the Lasix.   Past Medical History   Past Medical History:  Diagnosis Date  . Adenocarcinoma of right lung ([place]) 01/06/2016  . Anginal chest pain at rest [facility])    Chronic non, controlled on Lorazepam  . Anxiety   . Arthritis    "all over"   . BPH (benign prostatic hyperplasia)   . CAD (coronary artery disease)   . Chronic lower back  pain   . Complication of anesthesia    "problems making water afterwards"  . Depression   . DJD (degenerative joint disease)   . Esophageal ulcer without bleeding    bid ppi indefinitely  . Family history of adverse reaction to anesthesia    "children w/PONV"  . GERD (gastroesophageal reflux disease)   . Headache    "couple/week maybe" (09/04/2015)  . Hemochromatosis    Possible, elevated iron stores, hook-like osteophytes on hand films, normal LFTs  . Hepatitis    "yellow jaundice as a baby"  . History of ASCVD    MULTIVESSEL  . Hyperlipidemia   . Hypertension   . Myocardial infarction ([place]) 1999  . Osteoarthritis   . Pneumonia    "several times; got a little now" (09/04/2015)  . Rheumatoid arthritis ([place])   . Subdural hematoma ([facility])    small after fall 09/2016-plavix held, neurosurgery consulted.  Asymptomatic.      Past Surgical History:  Procedure Laterality Date  . ARM SKIN LESION BIOPSY / EXCISION Left 09/02/2015  . CATARACT EXTRACTION W/ INTRAOCULAR LENS  IMPLANT, BILATERAL Bilateral   . CHOLECYSTECTOMY OPEN    . CORNEAL TRANSPLANT Bilateral    "one at [facility]; one at [facility]"  . CORONARY ANGIOPLASTY WITH STENT PLACEMENT    . CORONARY ARTERY BYPASS GRAFT  1999  . DESCENDING AORTIC ANEURYSM REPAIR W/ STENT     DE stent ostium into the right radial free graft at OM-, 02-2007  . ESOPHAGOGASTRODUODENOSCOPY N/A 11/09/2014   Procedure: ESOPHAGOGASTRODUODENOSCOPY (EGD);  Surgeon: [name], MD;  Location: [facility] ENDOSCOPY;  Service: Endoscopy;  Laterality: N/A;  . INGUINAL HERNIA REPAIR    . JOINT REPLACEMENT    . LEFT HEART CATH AND CORS/GRAFTS ANGIOGRAPHY N/A 06/27/2017   Procedure: LEFT HEART CATH AND CORS/GRAFTS ANGIOGRAPHY;  Surgeon: [name], MD;  Location: [place] CV LAB;  Service: Cardiovascular;  Laterality: N/A;  . NASAL SINUS SURGERY    . SHOULDER OPEN ROTATOR CUFF REPAIR Right   . TOTAL KNEE ARTHROPLASTY Bilateral      Allergies  Allergies  Allergen Reactions   . Feldene [Piroxicam] Other (See Comments)    REACTION: Blisters  . Statins Other (See Comments)    Myalgias  . Doxycycline Other (See Comments)    REACTION:  unknown  . Latex Rash  . Tequin Anxiety  . Valium Other (See Comments)    REACTION:  unknown    Inpatient Medications    . furosemide  20 mg Intravenous Once  . iopamidol      . iopamidol        Family History    Family History  Problem Relation Age of Onset  . Arthritis-Osteo Sister   . Heart attack Brother   . Heart attack Other   . Cancer Neg Hx    He indicated that his mother is deceased. He indicated that his father is deceased. He indicated that his sister is alive. He indicated that the status of his brother is unknown. He indicated that his maternal grandmother is  deceased. He indicated that his maternal grandfather is deceased. He indicated that his paternal grandmother is deceased. He indicated that his paternal grandfather is deceased. He indicated that the status of his neg hx is unknown. He indicated that his other is deceased.   Social History    Social History   Socioeconomic History  . Marital status: Widowed    Spouse name: Not on file  . Number of children: Not on file  . Years of education: Not on file  . Highest education level: Not on file  Occupational History  . Not on file  Social Needs  . Financial resource strain: Not on file  . Food insecurity:    Worry: Not on file    Inability: Not on file  . Transportation needs:    Medical: Not on file    Non-medical: Not on file  Tobacco Use  . Smoking status: Never Smoker  . Smokeless tobacco: Former Systems developer    Types: Chew  . Tobacco comment: "quit chewing in the 1960s"  Substance and Sexual Activity  . Alcohol use: No  . Drug use: No  . Sexual activity: Never  Lifestyle  . Physical activity:    Days per week: Not on file    Minutes per session: Not on file  . Stress: Not on file  Relationships  . Social connections:    Talks on  phone: Not on file    Gets together: Not on file    Attends religious service: Not on file    Active member of club or organization: Not on file    Attends meetings of clubs or organizations: Not on file    Relationship status: Not on file  . Intimate partner violence:    Fear of current or ex partner: Not on file    Emotionally abused: Not on file    Physically abused: Not on file    Forced sexual activity: Not on file  Other Topics Concern  . Not on file  Social History Narrative   01-05-18 Unable to ask abuse questions family with him today.     Review of Systems    Review of Systems  Constitutional: Positive for chills, fever and malaise/fatigue. Negative for diaphoresis.  HENT: Positive for congestion.   Eyes: Negative for blurred vision and double vision.  Respiratory: Positive for cough, sputum production and shortness of breath. Negative for hemoptysis.   Cardiovascular: Positive for chest pain, orthopnea and PND. Negative for palpitations and leg swelling.  Gastrointestinal: Negative for abdominal pain, blood in stool, nausea and vomiting.  Genitourinary: Negative for hematuria.  Musculoskeletal: Negative for myalgias.  Neurological: Positive for weakness. Negative for dizziness, tingling and loss of consciousness.  Endo/Heme/Allergies: Does not bruise/bleed easily.    Physical Exam    Blood pressure 125/84, pulse 69, temperature 97.8 F (36.6 C), temperature source Oral, resp. rate 20, height 5\' 5"  (1.651 m), weight 78 kg, SpO2 97 %.  General: 84 y.o.  male resting comfortably in no acute distress. Pleasant and cooperative. HEENT: Normal  Neck: Supple. No carotid bruits. Mildly elevated JVD. Lungs: No increased work of breathing. Scattered expiratory wheezing. Bibasilar crackles. Heart: RRR. Distinct S1 and S2. No murmurs, gallops, or rubs.  Abdomen: Soft, non-distended, and non-tender to palpation. Bowel sounds present. Extremities: Lower extremity edema. Radial  pulses 2+ and equal bilaterally. All extremities warm to the touch with no signs of cyanosis.  Neuro: Alert and oriented x3. No focal deficits. Moves all extremities spontaneously. Psych: Normal  affect.  Labs    Troponin [facility] of Care Test) Recent Labs    02/02/18 0554  TROPIPOC 0.04   Recent Labs    02/02/18 0854  TROPONINI 0.03*   Lab Results  Component Value Date   WBC 6.8 02/02/2018   HGB 11.2 (L) 02/02/2018   HCT 33.9 (L) 02/02/2018   MCV 94.4 02/02/2018   PLT 86 (L) 02/02/2018    Recent Labs  Lab 01/31/18 1146 02/02/18 0537  NA 138 136  K 3.5 3.2*  CL 108 109  CO2 20* 19*  BUN 21 13  CREATININE 1.33* 1.17  CALCIUM 8.7* 8.4*  PROT 6.1*  --   BILITOT 2.0*  --   ALKPHOS 105  --   ALT 16  --   AST 23  --   GLUCOSE 112* 114*   Lab Results  Component Value Date   CHOL 112 01/19/2018   HDL 35 (L) 01/19/2018   LDLCALC 59 01/19/2018   TRIG 92 01/19/2018   Lab Results  Component Value Date   DDIMER 0.38 05/11/2017     Radiology Studies    Dg Chest 2 View  Result Date: 02/02/2018 CLINICAL DATA:  Initial evaluation for acute chest pain, shortness of breath. EXAM: CHEST - 2 VIEW COMPARISON:  Prior radiograph from 01/31/2018. FINDINGS: Median sternotomy wires noted. Underlying cardiomegaly, stable. Mediastinal silhouette normal. Aortic atherosclerosis. Lungs mildly hypoinflated. Streaky bibasilar linear opacities favored to reflect atelectatic changes. No consolidative airspace opacity. Veiling opacities overlying the bilateral lung bases likely reflect small layering pleural effusions, left greater than right. Perihilar vascular congestion without overt pulmonary edema. No pneumothorax. No acute osseus abnormality. IMPRESSION: 1. Cardiomegaly with mild perihilar vascular congestion and small layering bilateral pleural effusions, left greater than right. 2. Superimposed bibasilar atelectatic changes. 3. Aortic atherosclerosis. Electronically Signed   By: Jeannine Boga M.D.   On: 02/02/2018 06:31   Dg Chest 2 View  Result Date: 01/31/2018 CLINICAL DATA:  Productive cough for the past 2-3 weeks. History of lung cancer. EXAM: CHEST - 2 VIEW COMPARISON:  CT chest dated January 04, 2018. Chest x-ray dated December 02, 2017. FINDINGS: Stable mild cardiomegaly status post CABG. Coarsened interstitial markings are similar to prior studies. Bibasilar atelectasis/scarring is unchanged. No focal consolidation, pleural effusion, or pneumothorax. No acute osseous abnormality. IMPRESSION: No active cardiopulmonary disease. Electronically Signed   By: Titus Dubin M.D.   On: 01/31/2018 11:46   Ct Chest W Contrast  Result Date: 01/04/2018 CLINICAL DATA:  Follow-up lung cancer. Non-small cell lung cancer. Status post radiation therapy EXAM: CT CHEST WITH CONTRAST TECHNIQUE: Multidetector CT imaging of the chest was performed during intravenous contrast administration. CONTRAST:  7mL OMNIPAQUE IOHEXOL 300 MG/ML  SOLN COMPARISON:  12/02/2017 and 08/22/2017. FINDINGS: Cardiovascular: Mild cardiac enlargement. No pericardial effusion. Aortic atherosclerosis. Mediastinum/Nodes: Normal appearance of the thyroid gland. The trachea appears patent and is midline. Normal appearance of the esophagus. No supraclavicular or axillary adenopathy identified. Index high right paratracheal lymph node measures 8 mm, image 21/2. Previously 1.1 cm. Lungs/Pleura: No pleural effusion. There is mass-like architectural distortion within the posteromedial right lung base with surrounding fibrotic changes. This measures 5.3 x 5.1 by 2.8 cm. Previously this measured the same and is presumed to represent changes secondary to external beam radiation. Calcified granuloma identified within the left lower lobe. Bilateral lower lobe predominant interstitial reticulation is again identified. Upper Abdomen: No acute abnormality identified. Musculoskeletal: Scoliosis and spondylosis. No aggressive lytic or  sclerotic bone lesions. IMPRESSION:  1. Masslike architectural distortion within the right base is again identified likely reflecting changes secondary to external beam radiation. No specific findings identified to suggest residual or recurrence of tumor. 2. Previous index right paratracheal lymph node has decreased in size from previous exam. Now 8 mm. 3.  Aortic Atherosclerosis (ICD10-I70.0). Electronically Signed   By: Kerby Moors M.D.   On: 01/04/2018 13:08   Ct Angio Chest Pe W Or Wo Contrast  Result Date: 02/02/2018 CLINICAL DATA:  Chest pain, shortness of breath. History of right lung cancer. EXAM: CT ANGIOGRAPHY CHEST WITH CONTRAST TECHNIQUE: Multidetector CT imaging of the chest was performed using the standard protocol during bolus administration of intravenous contrast. Multiplanar CT image reconstructions and MIPs were obtained to evaluate the vascular anatomy. CONTRAST:  149mL ISOVUE-370 IOPAMIDOL (ISOVUE-370) INJECTION 76% COMPARISON:  Radiographs of same day.  CT scan of January 04, 2018. FINDINGS: Cardiovascular: Atherosclerosis of thoracic aorta is noted without aneurysm formation. Filling defect is seen in peripheral lower lobe branch of right pulmonary artery concerning for pulmonary embolus. Mild cardiomegaly is noted no pericardial effusion is noted. Status post coronary artery bypass graft. Mediastinum/Nodes: Thyroid gland and esophagus are unremarkable. 15 mm right paratracheal lymph node is noted which is enlarged compared to prior exam. Stable small right hilar lymph node is noted. Stable 1 cm subcarinal lymph node is noted. Lungs/Pleura: Minimal bilateral pleural effusions are noted. No pneumothorax is noted. Stable right posterior basilar opacity is noted which may represent atelectasis, infiltrate or possibly radiation fibrosis. Stable right upper lobe scarring is noted. Upper Abdomen: No acute abnormality. Musculoskeletal: No chest wall abnormality. No acute or significant osseous  findings. Review of the MIP images confirms the above findings. IMPRESSION: Acute embolus is noted in peripheral lower lobe branch of right pulmonary artery. Critical Value/emergent results were called by telephone at the time of interpretation on 02/02/2018 at 10:59 am to Dr. Toy Baker , who verbally acknowledged these results. Right paratracheal lymph node measures 15 mm which is enlarged compared to prior exam; metastatic disease cannot be excluded. Other mediastinal lymph nodes are unchanged. Minimal bilateral pleural effusions are noted. Stable right posterior basilar opacity is noted which may represent atelectasis, infiltrate or possibly radiation fibrosis. Aortic Atherosclerosis (ICD10-I70.0). Electronically Signed   By: Marijo Conception, M.D.   On: 02/02/2018 10:59    EKG     EKG: EKG was personally reviewed and demonstrates: normal sinus rhythm, rate 69 bpm, with PVC and nonspecific T wave changes    Telemetry: Telemetry was personally reviewed and demonstrates: sinus rhythm   Cardiac Imaging    Left Heart Catheterization 06/27/2017: Conclusion:  Prox RCA lesion is 30% stenosed.  Mid RCA lesion is 10% stenosed.  Ost 1st Mrg lesion is 80% stenosed.  Previously placed Prox Cx stent (unknown type) is widely patent.  Ost 2nd Mrg lesion is 65% stenosed.  Ost LAD lesion is 100% stenosed.  Previously placed Origin to Prox Graft stent (unknown type) is widely patent.  The left ventricular ejection fraction is 55-65% by visual estimate.  LV end diastolic pressure is normal.  The left ventricular systolic function is normal.   Hyperdynamic LV function with a spade-like ventricle and ejection fraction at 60-65%.  Multi vessel native CAD with total occlusion of the very proximal LAD; 75% ostial stenosis of the OM1 vessel with a patent LCX stent between the OM1 and OM 2 vessels, and 65-70% ostial narrowing at the origin of the OM 2 vessel; dominant RCA with 30%  proximal and  10% mid stenosis.  Widely patent free radial graft supplying the OM1 vessel with a patent ostial proximal stent in the graft.  Patent LIMA graft supplying the LAD system.  Recommendation: Medical therapy. _______________  Holter Monitor Interpretation 03/15/2017: Study Highlights: Intermittent atrial flutter with mostly controlled ventricular response. Average HR 69 bpm. Range was 40-117 bpm. _______________  Echocardiogram 12/21/2016: Study Conclusions: - Left ventricle: The cavity size was normal. There was moderate   concentric hypertrophy. Systolic function was normal. The   estimated ejection fraction was in the range of 60% to 65%. Wall   motion was normal; there were no regional wall motion   abnormalities. - Aortic valve: Severely calcified annulus. Trileaflet; mildly   thickened, mildly calcified leaflets. There was mild stenosis.   Valve area (VTI): 1.38 cm^2. Valve area (Vmax): 1.08 cm^2. Valve   area (Vmean): 1.43 cm^2. - Aorta: Aortic root dimension: 40 mm (ED). - Aortic root: The aortic root was mildly dilated. - Mitral valve: Calcified annulus. There was mild regurgitation. - Left atrium: The atrium was severely dilated. - Right atrium: The atrium was moderately dilated. - Tricuspid valve: There was mild regurgitation. - Pulmonary arteries: PA peak pressure: 32 mm Hg (S). - Pericardium, extracardiac: A trivial, free-flowing pericardial   effusion was identified posterior to the heart. The fluid had no   internal echoes.  Assessment & Plan    1. Chest Pain - Patient presents with left sided chest tightness and shortness of breath that woke him from sleep.  - Initial EKG showed normal sinus rhythm with non-specific T wave changes. - I-stat troponin negative. - Chest x-ray cardiomegaly with mild perihilar vascular congestion and small bilateral pleural effusion as was as bibasilar atelectatic changes. - CTA showed evidence of pulmonary embolism.  - BNP  elevated at 605.2.  - Echo pending.  - Will continue to trend troponin. - Patient denies any current angina.   - Chest pain most likely secondary to pulmonary embolism. Patient also appears slightly volume overloaded which could be contributing to symptoms of chest pain and shortness of breath. Given evidence of pulmonary embolism and patent stents and grafts on catheterization in 06/2017, no additional ischemic workup is necessary at this time.  2. Pulmonary Embolism - CTA showed evidence of acute embolus in peripheral lower lobe branch of right pulmonary artery. - Patient has been started on Heparin in the ED. - Per primary team.   3. Diastolic CHF - Patient has acute pulmonary embolism but does appear slightly volume overloaded with crackles in bilateral bases and mildly elevated JVD which could be contributing to chest pain and shortness of breath. - Chest x-ray as above. - BNP elevated at 605.2. - Patient has received IV Lasix 20mg  with no significant improvement.   - Will give another dose of IV Lasix 20mg  later this afternoon and reassess volume status tomorrow morning.  - Recommend monitoring daily weights and strict I's and O's.  4. CAD s/p CABG  - Patient s/p CABG x2 in 1998. Most recent cardiac catheterization in 06/2017 showed patent stents and grafts. - Continue beta-blocker and statin.  5. Paroxsymal Atrial Fibrillation/Flutter - Patient currently in sinus rhythm.  - Continue Toprol-XL 25mg  daily. - Continue Eliquis 5mg  twice daily.  - Potassium 3.2. Recommended goal of 4.0. - Magnesium. Recommended goal of 2.0.  Signed, Darreld Mclean, PA-C 02/02/2018, 12:11 PM  For questions or updates, please contact   Please consult [email or web address] for contact info under Cardiology/STEMI.  I  have personally seen and examined this patient with Sande Rives, PA-C. I agree with the assessment and plan as outlined above. Changes have been made in the above note as necessary.  [name] has a history of chronic diastolic CHF and CAD s/p CABG. Last cardiac cath in March 2019 with patent stents and grafts. He has done well until recently. He awoke this am with dyspnea and chest pain. He has missed doses of his Eliquis at home. He is found to be mildly volume overloaded. Chest CTA with acute PE. He is being started on heparin by the primary team. He has been given IV lasix x 1 in the ED.  EKG with sinus rhythm and non-specific T wave abnormalities, personally reviewed by me.  Labs reviewed by me.  My exam: Elderly male, appears dyspneic. +JVD. No LE edema. CV:RRR with soft systolic murmur. Lungs with basilar crackles. VVO:HYWV, mildly distended. NT Plan:  Acute PE: He has missed eliquis doses at home. He is being started on IV heparin.  Acute on chronic diastolic CHF: I agree with diuresis for mild volume overload. One more dose of Lasix 20 mg IV tonight.  CAD with chest pain: Troponin negative. EKG without acute ischemic changes. Cycle troponin. I suspect his chest pain is due to his PE.  We will follow with you.   Lauree Chandler 02/02/2018 12:16 PM

## 2018-02-02 NOTE — ED Notes (Signed)
Cardiology @ bedside.

## 2018-02-02 NOTE — Progress Notes (Signed)
  Echocardiogram 2D Echocardiogram has been performed.  Evan [name] 02/02/2018, 2:43 PM

## 2018-02-02 NOTE — Progress Notes (Signed)
PT Cancellation Note  Patient Details Name: Evan [name] MRN: [id number] DOB: 01-27-1928   Cancelled Treatment:    Reason Eval/Treat Not Completed: Patient at procedure or test/unavailable(Pt at [facility] lab. )   Denice Paradise 02/02/2018, 2:54 PM Maxemiliano Riel,PT Acute Rehabilitation Services Pager:  [phone]  Office:  [phone]

## 2018-02-02 NOTE — ED Notes (Signed)
ED Provider at bedside. 

## 2018-02-02 NOTE — ED Provider Notes (Signed)
[facility] EMERGENCY DEPARTMENT Provider Note   CSN: [id number] Arrival date & time: 02/02/18  9563     History   Chief Complaint Chief Complaint  Patient presents with  . Shortness of Breath  . Chest Pain    HPI DONYAE KILNER is a 84 y.o. male.  Patient is an 84 year old male with past medical history of coronary artery disease with prior CABG 20 years ago, CHF, GERD.  He is currently taking Eliquis.  He presents today for evaluation of chest pain.  This began several days ago and is worsening.  He was seen here 2 days ago and diagnosed with bronchitis.  He was started on amoxicillin, however his symptoms are not improving.  This evening, his pain became significantly worse and presents for evaluation of this.  He denies to me he is experiencing any fevers, chills, or productive cough.  His pain is not worse with breathing.  The history is provided by the patient.    Past Medical History:  Diagnosis Date  . Adenocarcinoma of right lung ([place]) 01/06/2016  . Anginal chest pain at rest [facility])    Chronic non, controlled on Lorazepam  . Anxiety   . Arthritis    "all over"   . BPH (benign prostatic hyperplasia)   . CAD (coronary artery disease)   . Chronic lower back pain   . Complication of anesthesia    "problems making water afterwards"  . Depression   . DJD (degenerative joint disease)   . Esophageal ulcer without bleeding    bid ppi indefinitely  . Family history of adverse reaction to anesthesia    "children w/PONV"  . GERD (gastroesophageal reflux disease)   . Headache    "couple/week maybe" (09/04/2015)  . Hemochromatosis    Possible, elevated iron stores, hook-like osteophytes on hand films, normal LFTs  . Hepatitis    "yellow jaundice as a baby"  . History of ASCVD    MULTIVESSEL  . Hyperlipidemia   . Hypertension   . Myocardial infarction ([place]) 1999  . Osteoarthritis   . Pneumonia    "several times; got a little now" (09/04/2015)  .  Rheumatoid arthritis ([place])   . Subdural hematoma ([facility])    small after fall 09/2016-plavix held, neurosurgery consulted.  Asymptomatic.      Patient Active Problem List   Diagnosis Date Noted  . Non-ST elevation MI (NSTEMI) ([place]) 06/27/2017  . Diastolic CHF ([place]) 87/56/4332  . Chest pain due to CAD ([place]) 06/26/2017  . Dyspnea 06/21/2017  . Acute bronchitis 05/15/2017  . Oral candidiasis 05/15/2017  . Acute pericarditis   . Chest pain at rest 12/20/2016  . Angina pectoris ([place]) 12/20/2016  . Radiation pneumonitis ([place]) 11/09/2016  . Pleurisy 10/11/2016  . Chest wall pain 10/11/2016  . Atrial fibrillation ([place]) 05/21/2016  . Adenocarcinoma of right lung ([place]) 01/06/2016  . GAD (generalized anxiety disorder) 11/20/2015  . Essential hypertension   . Cellulitis 09/04/2015  . Cellulitis of left axilla   . Esophageal ulcer without bleeding   . Bilateral lower extremity edema 04/28/2014  . BPH (benign prostatic hyperplasia) 10/08/2012  . Anxiety   . EKG abnormalities 09/05/2012  . Tremor 09/05/2012  . Chronic fatigue 09/05/2012  . Arthritis   . Asthma, chronic   . Reflux esophagitis   . S/P CABG (coronary artery bypass graft)   . GERD (gastroesophageal reflux disease)   . Hyperlipidemia   . Hypertension   . UNSPECIFIED PERIPHERAL VASCULAR DISEASE 06/16/2009  Past Surgical History:  Procedure Laterality Date  . ARM SKIN LESION BIOPSY / EXCISION Left 09/02/2015  . CATARACT EXTRACTION W/ INTRAOCULAR LENS  IMPLANT, BILATERAL Bilateral   . CHOLECYSTECTOMY OPEN    . CORNEAL TRANSPLANT Bilateral    "one at [facility]; one at [facility]"  . CORONARY ANGIOPLASTY WITH STENT PLACEMENT    . CORONARY ARTERY BYPASS GRAFT  1999  . DESCENDING AORTIC ANEURYSM REPAIR W/ STENT     DE stent ostium into the right radial free graft at OM-, 02-2007  . ESOPHAGOGASTRODUODENOSCOPY N/A 11/09/2014   Procedure: ESOPHAGOGASTRODUODENOSCOPY (EGD);  Surgeon: [name], MD;  Location: [facility] ENDOSCOPY;  Service:  Endoscopy;  Laterality: N/A;  . INGUINAL HERNIA REPAIR    . JOINT REPLACEMENT    . LEFT HEART CATH AND CORS/GRAFTS ANGIOGRAPHY N/A 06/27/2017   Procedure: LEFT HEART CATH AND CORS/GRAFTS ANGIOGRAPHY;  Surgeon: [name], MD;  Location: [facility] CV LAB;  Service: Cardiovascular;  Laterality: N/A;  . NASAL SINUS SURGERY    . SHOULDER OPEN ROTATOR CUFF REPAIR Right   . TOTAL KNEE ARTHROPLASTY Bilateral         Home Medications    Prior to Admission medications   Medication Sig Start Date End Date Taking? Authorizing Provider  amoxicillin-clavulanate (AUGMENTIN) 875-125 MG tablet Take 1 tablet by mouth every 12 (twelve) hours. 01/31/18  Yes Pattricia Boss, MD  Cholecalciferol 2000 units TABS Take 1 tablet (2,000 Units total) by mouth daily. 02/04/16  Yes Pickard, Cammie Mcgee, MD  ELIQUIS 5 MG TABS tablet TAKE ONE (1) TABLET BY MOUTH TWO (2) TIMES DAILY 02/01/18  Yes Jettie Booze, MD  ENSURE (ENSURE) Take 237 mLs by mouth 2 (two) times daily between meals.   Yes [provider]  FLUoxetine (PROZAC) 20 MG capsule TAKE ONE CAPSULE BY MOUTH DAILY Patient taking differently: Take 20 mg by mouth daily.  02/01/18  Yes [name], MD  furosemide (LASIX) 20 MG tablet Take 1 tablet (20 mg total) once a day. Take an extra tablet (20 mg total) for increased swelling, weight gain, or shortness of breath. Patient taking differently: Take 20 mg by mouth daily. May take an extra tablet (20 mg total) for increased swelling, weight gain, or shortness of breath. 01/16/18  Yes Jettie Booze, MD  ipratropium-albuterol (DUONEB) 0.5-2.5 (3) MG/3ML SOLN Take 3 mLs by nebulization every 4 (four) hours as needed (or Chest Pain / Tightness). 12/23/16  Yes Kilroy, Luke K, PA-C  leflunomide (ARAVA) 10 MG tablet Take 10 mg by mouth daily. 06/30/17  Yes [provider]  Lifitegrast Shirley Friar) 5 % SOLN Place 1-2 drops into both eyes daily.    Yes [provider]  LORazepam  (ATIVAN) 1 MG tablet TAKE ONE TABLET BY MOUTH EVERY EIGHT HOURS AS NEEDED FOR ANXIETY Patient taking differently: Take 1 mg by mouth every 8 (eight) hours as needed for anxiety.  02/01/18  Yes [name], MD  metoprolol succinate (TOPROL-XL) 25 MG 24 hr tablet TAKE ONE (1) TABLET BY MOUTH EVERY DAY Patient taking differently: Take 25 mg by mouth daily.  09/11/17  Yes Jettie Booze, MD  nitroGLYCERIN (NITROSTAT) 0.4 MG SL tablet Place 1 tablet (0.4 mg total) under the tongue every 5 (five) minutes as needed for chest pain. 12/06/17  Yes Daune Perch, NP  pantoprazole (PROTONIX) 40 MG tablet Take one (1) tablet (40 mg) by mouth daily. Patient taking differently: Take 40 mg by mouth daily.  02/21/17  Yes Larae Grooms  S, MD  rosuvastatin (CRESTOR) 5 MG tablet TAKE 5 mg  EVERY DAY Patient taking differently: Take 5 mg by mouth daily.  07/07/17  Yes Jettie Booze, MD  tamsulosin (FLOMAX) 0.4 MG CAPS capsule TAKE 1 CAPSULE EVERY DAY Patient taking differently: Take 0.4 mg by mouth daily.  10/18/17  Yes [name], MD    Family History Family History  Problem Relation Age of Onset  . Arthritis-Osteo Sister   . Heart attack Brother   . Heart attack Other   . Cancer Neg Hx     Social History Social History   Tobacco Use  . Smoking status: Never Smoker  . Smokeless tobacco: Former Systems developer    Types: Chew  . Tobacco comment: "quit chewing in the 1960s"  Substance Use Topics  . Alcohol use: No  . Drug use: No     Allergies   Feldene [piroxicam]; Statins; Doxycycline; Latex; Tequin; and Valium   Review of Systems Review of Systems  All other systems reviewed and are negative.    Physical Exam Updated Vital Signs BP (!) 149/70   Pulse 69   Temp 97.8 F (36.6 C) (Oral)   Resp 19   Ht 5\' 5"  (1.651 m)   Wt 78 kg   SpO2 97%   BMI 28.62 kg/m   Physical Exam  Constitutional: He is oriented to person, place, and time. He appears well-developed and  well-nourished. No distress.  HENT:  Head: Normocephalic and atraumatic.  Mouth/Throat: Oropharynx is clear and moist.  Neck: Normal range of motion. Neck supple.  Cardiovascular: Normal rate and regular rhythm. Exam reveals no friction rub.  No murmur heard. Pulmonary/Chest: Effort normal and breath sounds normal. No respiratory distress. He has no wheezes. He has no rales.  Abdominal: Soft. Bowel sounds are normal. He exhibits no distension. There is no tenderness.  Musculoskeletal: Normal range of motion.       Right lower leg: He exhibits edema.       Left lower leg: He exhibits edema.  There is trace edema of both lower extremities.  Neurological: He is alert and oriented to person, place, and time. Coordination normal.  Skin: Skin is warm and dry. He is not diaphoretic.  Nursing note and vitals reviewed.    ED Treatments / Results  Labs (all labs ordered are listed, but only abnormal results are displayed) Labs Reviewed  BASIC METABOLIC PANEL - Abnormal; Notable for the following components:      Result Value   Potassium 3.2 (*)    CO2 19 (*)    Glucose, Bld 114 (*)    Calcium 8.4 (*)    GFR calc non Af Amer 54 (*)    All other components within normal limits  CBC - Abnormal; Notable for the following components:   RBC 3.59 (*)    Hemoglobin 11.2 (*)    HCT 33.9 (*)    Platelets 86 (*)    All other components within normal limits  BRAIN NATRIURETIC PEPTIDE  I-STAT TROPONIN, ED    EKG EKG Interpretation  Date/Time:  Friday February 02 2018 05:14:51 EDT Ventricular Rate:  69 PR Interval:    QRS Duration: 102 QT Interval:  484 QTC Calculation: 518 R Axis:   -3 Text Interpretation:  Sinus rhythm Nonspecific T wave abnormality Baseline wander Confirmed by Veryl Speak [phone]) on 02/02/2018 6:14:41 AM   Radiology Dg Chest 2 View  Result Date: 02/02/2018 CLINICAL DATA:  Initial evaluation for acute chest pain,  shortness of breath. EXAM: CHEST - 2 VIEW  COMPARISON:  Prior radiograph from 01/31/2018. FINDINGS: Median sternotomy wires noted. Underlying cardiomegaly, stable. Mediastinal silhouette normal. Aortic atherosclerosis. Lungs mildly hypoinflated. Streaky bibasilar linear opacities favored to reflect atelectatic changes. No consolidative airspace opacity. Veiling opacities overlying the bilateral lung bases likely reflect small layering pleural effusions, left greater than right. Perihilar vascular congestion without overt pulmonary edema. No pneumothorax. No acute osseus abnormality. IMPRESSION: 1. Cardiomegaly with mild perihilar vascular congestion and small layering bilateral pleural effusions, left greater than right. 2. Superimposed bibasilar atelectatic changes. 3. Aortic atherosclerosis. Electronically Signed   By: Jeannine Boga M.D.   On: 02/02/2018 06:31   Dg Chest 2 View  Result Date: 01/31/2018 CLINICAL DATA:  Productive cough for the past 2-3 weeks. History of lung cancer. EXAM: CHEST - 2 VIEW COMPARISON:  CT chest dated January 04, 2018. Chest x-ray dated December 02, 2017. FINDINGS: Stable mild cardiomegaly status post CABG. Coarsened interstitial markings are similar to prior studies. Bibasilar atelectasis/scarring is unchanged. No focal consolidation, pleural effusion, or pneumothorax. No acute osseous abnormality. IMPRESSION: No active cardiopulmonary disease. Electronically Signed   By: Titus Dubin M.D.   On: 01/31/2018 11:46    Procedures Procedures (including critical care time)  Medications Ordered in ED Medications  morphine 4 MG/ML injection 4 mg (4 mg Intravenous Given 02/02/18 0650)  furosemide (LASIX) injection 20 mg (20 mg Intravenous Given 02/02/18 0645)     Initial Impression / Assessment and Plan / ED Course  I have reviewed the triage vital signs and the nursing notes.  Pertinent labs & imaging results that were available during my care of the patient were reviewed by me and considered in my medical  decision making (see chart for details).  Patient with extensive past medical history including coronary artery disease with CABG, prior lung CA presenting with complaints of chest pain.  He was diagnosed with possible bronchitis 2 nights ago, but is not improving.  His work-up today shows interval development of bilateral pleural effusions.  His BNP is 605.  He was given IV Lasix and I believe will require admission for observation and further work-up.  I spoken with Dr. Roel Cluck who agrees to admit.  Final Clinical Impressions(s) / ED Diagnoses   Final diagnoses:  None    ED Discharge Orders    None       Veryl Speak, MD 02/02/18 [phone]

## 2018-02-02 NOTE — Telephone Encounter (Signed)
I spoke with Dr Roel Cluck and explained to her that Dr Julien Nordmann was not in the office today and I would relay this message to him and call patient with appointment date/time.  She was agreeable with this at the time we spoke. IB message sent to Dr Julien Nordmann regarding appointment.

## 2018-02-02 NOTE — Progress Notes (Signed)
ANTICOAGULATION CONSULT NOTE - Follow Up Consult  Pharmacy Consult for Heparin Indication: pulmonary embolus  Allergies  Allergen Reactions  . Feldene [Piroxicam] Other (See Comments)    REACTION: Blisters  . Statins Other (See Comments)    Myalgias  . Doxycycline Other (See Comments)    REACTION:  unknown  . Latex Rash  . Tequin Anxiety  . Valium Other (See Comments)    REACTION:  unknown    Patient Measurements: Height: 5\' 5"  (165.1 cm) Weight: 172 lb (78 kg) IBW/kg (Calculated) : 61.5  Vital Signs: Temp: 98.1 F (36.7 C) (11/01 1750) Temp Source: Oral (11/01 1750) BP: 157/95 (11/01 1750) Pulse Rate: 68 (11/01 1750)  Labs: Recent Labs    01/31/18 1146 02/02/18 0537 02/02/18 0854 02/02/18 1357 02/02/18 1753  HGB 11.8* 11.2*  --   --   --   HCT 37.0* 33.9*  --   --   --   PLT 80* 86*  --   --   --   APTT  --   --   --   --  89*  HEPARINUNFRC  --   --   --   --  2.02*  CREATININE 1.33* 1.17  --   --   --   TROPONINI  --   --  0.03* 0.04*  --     Estimated Creatinine Clearance: 42 mL/min (by C-G formula based on SCr of 1.17 mg/dL).   Assessment: 84 yo male on Eliquis PTA for a fib and h/o DVT with non-compliance issues (last dose in pm on 10/31). Pharmacy consulted for heparin therapy.  PM: HL came back elevated due to the dose last PM. PTT is therapeutic. We will do a confirm level in AM  Goal of Therapy:  Heparin level 0.3-0.7 units/ml  APTT 66 - 102 sec Monitor platelets by anticoagulation protocol: Yes   Plan:  Continue heparin at 1250 units/hr Confirm PTT in AM Due to recent Eliquis dose, will monitor aPTT and HL  Onnie Boer, PharmD, [place], [place], CPP Infectious Disease Pharmacist Pager: [phone] 02/02/2018 7:57 PM

## 2018-02-03 ENCOUNTER — Observation Stay (HOSPITAL_BASED_OUTPATIENT_CLINIC_OR_DEPARTMENT_OTHER): Payer: Medicare HMO

## 2018-02-03 DIAGNOSIS — I110 Hypertensive heart disease with heart failure: Secondary | ICD-10-CM | POA: Diagnosis not present

## 2018-02-03 DIAGNOSIS — I2699 Other pulmonary embolism without acute cor pulmonale: Secondary | ICD-10-CM

## 2018-02-03 DIAGNOSIS — I82452 Acute embolism and thrombosis of left peroneal vein: Secondary | ICD-10-CM | POA: Diagnosis not present

## 2018-02-03 DIAGNOSIS — Z86718 Personal history of other venous thrombosis and embolism: Secondary | ICD-10-CM | POA: Diagnosis not present

## 2018-02-03 DIAGNOSIS — I2694 Multiple subsegmental pulmonary emboli without acute cor pulmonale: Secondary | ICD-10-CM | POA: Diagnosis not present

## 2018-02-03 DIAGNOSIS — I2693 Single subsegmental pulmonary embolism without acute cor pulmonale: Secondary | ICD-10-CM | POA: Diagnosis not present

## 2018-02-03 DIAGNOSIS — I82409 Acute embolism and thrombosis of unspecified deep veins of unspecified lower extremity: Secondary | ICD-10-CM | POA: Diagnosis not present

## 2018-02-03 DIAGNOSIS — Z86711 Personal history of pulmonary embolism: Secondary | ICD-10-CM | POA: Diagnosis not present

## 2018-02-03 DIAGNOSIS — K210 Gastro-esophageal reflux disease with esophagitis: Secondary | ICD-10-CM | POA: Diagnosis not present

## 2018-02-03 DIAGNOSIS — I5033 Acute on chronic diastolic (congestive) heart failure: Secondary | ICD-10-CM | POA: Diagnosis not present

## 2018-02-03 DIAGNOSIS — E785 Hyperlipidemia, unspecified: Secondary | ICD-10-CM | POA: Diagnosis not present

## 2018-02-03 DIAGNOSIS — C3491 Malignant neoplasm of unspecified part of right bronchus or lung: Secondary | ICD-10-CM | POA: Diagnosis not present

## 2018-02-03 DIAGNOSIS — I2510 Atherosclerotic heart disease of native coronary artery without angina pectoris: Secondary | ICD-10-CM | POA: Diagnosis not present

## 2018-02-03 LAB — LIPID PANEL
Cholesterol: 75 mg/dL (ref 0–200)
HDL: 28 mg/dL — ABNORMAL LOW (ref >40)
LDL Cholesterol: 35 mg/dL (ref 0–99)
Total CHOL/HDL Ratio: 2.7 RATIO
Triglycerides: 60 mg/dL (ref <150)
VLDL: 12 mg/dL (ref 0–40)

## 2018-02-03 LAB — CBC
HCT: 33.6 % — ABNORMAL LOW (ref 39.0–52.0)
Hemoglobin: 11.1 g/dL — ABNORMAL LOW (ref 13.0–17.0)
MCH: 30.9 pg (ref 26.0–34.0)
MCHC: 33 g/dL (ref 30.0–36.0)
MCV: 93.6 fL (ref 80.0–100.0)
Platelets: 82 10*3/uL — ABNORMAL LOW (ref 150–400)
RBC: 3.59 MIL/uL — ABNORMAL LOW (ref 4.22–5.81)
RDW: 13.7 % (ref 11.5–15.5)
WBC: 5 10*3/uL (ref 4.0–10.5)
nRBC: 0 % (ref 0.0–0.2)

## 2018-02-03 LAB — COMPREHENSIVE METABOLIC PANEL
ALT: 15 U/L (ref 0–44)
AST: 23 U/L (ref 15–41)
Albumin: 3.1 g/dL — ABNORMAL LOW (ref 3.5–5.0)
Alkaline Phosphatase: 106 U/L (ref 38–126)
Anion gap: 6 (ref 5–15)
BUN: 11 mg/dL (ref 8–23)
CO2: 21 mmol/L — ABNORMAL LOW (ref 22–32)
Calcium: 8.1 mg/dL — ABNORMAL LOW (ref 8.9–10.3)
Chloride: 109 mmol/L (ref 98–111)
Creatinine, Ser: 1.13 mg/dL (ref 0.61–1.24)
GFR calc Af Amer: >60 mL/min (ref >60)
GFR calc non Af Amer: 56 mL/min — ABNORMAL LOW (ref >60)
Glucose, Bld: 110 mg/dL — ABNORMAL HIGH (ref 70–99)
Potassium: 3.6 mmol/L (ref 3.5–5.1)
Sodium: 136 mmol/L (ref 135–145)
Total Bilirubin: 1.2 mg/dL (ref 0.3–1.2)
Total Protein: 5.7 g/dL — ABNORMAL LOW (ref 6.5–8.1)

## 2018-02-03 LAB — APTT
aPTT: >200 seconds (ref 24–36)
aPTT: 120 seconds — ABNORMAL HIGH (ref 24–36)

## 2018-02-03 LAB — URINE CULTURE: Culture: NO GROWTH

## 2018-02-03 LAB — MAGNESIUM: Magnesium: 1.6 mg/dL — ABNORMAL LOW (ref 1.7–2.4)

## 2018-02-03 LAB — PHOSPHORUS: Phosphorus: 2.4 mg/dL — ABNORMAL LOW (ref 2.5–4.6)

## 2018-02-03 LAB — HEPARIN LEVEL (UNFRACTIONATED): Heparin Unfractionated: 1.2 IU/mL — ABNORMAL HIGH (ref 0.30–0.70)

## 2018-02-03 MED ORDER — MAGNESIUM OXIDE 400 (241.3 MG) MG PO TABS: 400.0000 mg | ORAL_TABLET | Freq: Two times a day (BID) | ORAL | 0 refills | Status: DC

## 2018-02-03 MED ORDER — ENOXAPARIN SODIUM 80 MG/0.8ML ~~LOC~~ SOLN: 80.0000 mg | Freq: Two times a day (BID) | SUBCUTANEOUS | 0 refills | Status: DC

## 2018-02-03 MED ORDER — ENOXAPARIN SODIUM 80 MG/0.8ML ~~LOC~~ SOLN
1.0000 mg/kg | Freq: Two times a day (BID) | SUBCUTANEOUS | Status: DC
  Administered 2018-02-03: 80 mg via SUBCUTANEOUS
  Filled 2018-02-03: qty 0.8

## 2018-02-03 NOTE — Progress Notes (Signed)
OT Cancellation Note  Patient Details Name: Evan [name] MRN: [id number] DOB: 12-05-1927   Cancelled Treatment:    Reason Eval/Treat Not Completed: Patient not medically ready.  Pt with new PE, started on Heparin yesterday and it has not yet been 24 hours since started.  Will hold OT this am, and check back once he's been on Heparin > 24 hours and medially okay to work with OT.  Lucille Passy, OTR/L [place] Pager [phone] Office [phone]   Lucille Passy M 02/03/2018, 9:31 AM

## 2018-02-03 NOTE — Progress Notes (Signed)
VASCULAR LAB PRELIMINARY  PRELIMINARY  PRELIMINARY  PRELIMINARY  Bilateral lower extremity venous duplex completed.    Preliminary report:  There is acute DVT noted in the left peroneal vein.  Evan [name], RVT 02/03/2018, 9:39 AM

## 2018-02-03 NOTE — Progress Notes (Signed)
PT Cancellation Note  Patient Details Name: Evan [name] MRN: [id number] DOB: [birth year]   Cancelled Treatment:    Reason Eval/Treat Not Completed: Patient not medically ready. Pt with new PE, started on Heparin yesterday and it has not yet been 24 hours since started.  Will hold PT at this time, and check back once he's been on Heparin > 24 hours and medially ready to work with PT.   Thelma Comp 02/03/2018, 10:08 AM   Rolinda Roan, PT, DPT Acute Rehabilitation Services Pager: [phone] Office: [phone]

## 2018-02-03 NOTE — Progress Notes (Signed)
DAILY PROGRESS NOTE   Patient Name: Evan [name] Date of Encounter: 02/03/2018  Chief Complaint   No chest pain today  Patient Profile   Evan [name] is a 84 y.o. male with a history of CAD s/p CABG x2 in 1998 with patent stents and grafts on cardiac catheterization in 06/2017, atrial fibrillation/flutter on Eliquis, chronic diastolic heart failure, and lung cancer, who is being seen today for the evaluation of chest pain at the request of Dr. Roel Cluck.  Subjective   Found to have acute DVT and PE yesterday - has missed doses of anticoagulation on Eliquis prior to admission. Echo yesterday shows LVEF 60-65% without regional wall motion abnormalities, there is moderate RAE, but normal RV size and function, not suggestive of RH strain - the PE was noted to be peripheral.  Objective   Vitals:   02/02/18 1750 02/02/18 2023 02/02/18 2336 02/03/18 0522  BP: (!) 157/95 (!) 160/86 (!) 132/59 (!) 133/99  Pulse: 68 94 85 83  Resp: (!) 22 20 (!) 22 20  Temp: 98.1 F (36.7 C) 98.5 F (36.9 C) 98 F (36.7 C) 98.4 F (36.9 C)  TempSrc: Oral Oral Oral Oral  SpO2: 97% 96% 93% 94%  Weight:    81.4 kg  Height:        Intake/Output Summary (Last 24 hours) at 02/03/2018 1004 Last data filed at 02/02/2018 2336 Gross per 24 hour  Intake 672.56 ml  Output 2425 ml  Net -1752.44 ml   Filed Weights   02/02/18 0519 02/03/18 0522  Weight: 78 kg 81.4 kg    Physical Exam   General appearance: alert and no distress Lungs: diminished breath sounds bibasilar Heart: regular rate and rhythm Extremities: extremities normal, atraumatic, no cyanosis or edema Neurologic: Grossly normal Psych: Pleasant  Inpatient Medications    Scheduled Meds: . FLUoxetine  20 mg Oral Daily  . leflunomide  10 mg Oral Daily  . Lifitegrast  1-2 drop Both Eyes Daily  . magnesium oxide  400 mg Oral BID  . pantoprazole  40 mg Oral Daily  . rosuvastatin  5 mg Oral q1800  . sodium chloride flush  3 mL Intravenous  Q12H  . tamsulosin  0.4 mg Oral Daily    Continuous Infusions: . sodium chloride    . sodium chloride 50 mL/hr at 02/02/18 1204  . heparin 1,250 Units/hr (02/02/18 1205)    PRN Meds: sodium chloride, acetaminophen **OR** acetaminophen, HYDROcodone-acetaminophen, ipratropium-albuterol, levalbuterol, LORazepam, ondansetron **OR** ondansetron (ZOFRAN) IV, sodium chloride flush   Labs   Results for orders placed or performed during the hospital encounter of 02/02/18 (from the past 48 hour(s))  Basic metabolic panel     Status: Abnormal   Collection Time: 02/02/18  5:37 AM  Result Value Ref Range   Sodium 136 135 - 145 mmol/L   Potassium 3.2 (L) 3.5 - 5.1 mmol/L   Chloride 109 98 - 111 mmol/L   CO2 19 (L) 22 - 32 mmol/L   Glucose, Bld 114 (H) 70 - 99 mg/dL   BUN 13 8 - 23 mg/dL   Creatinine, Ser 1.17 0.61 - 1.24 mg/dL   Calcium 8.4 (L) 8.9 - 10.3 mg/dL   GFR calc non Af Amer 54 (L) >60 mL/min   GFR calc Af Amer >60 >60 mL/min    Comment: (NOTE) The eGFR has been calculated using the CKD EPI equation. This calculation has not been validated in all clinical situations. eGFR's persistently <60 mL/min signify possible Chronic Kidney  Disease.    Anion gap 8 5 - 15    Comment: Performed at [facility] [address], [place], [place] [address]  CBC     Status: Abnormal   Collection Time: 02/02/18  5:37 AM  Result Value Ref Range   WBC 6.8 4.0 - 10.5 K/uL   RBC 3.59 (L) 4.22 - 5.81 MIL/uL   Hemoglobin 11.2 (L) 13.0 - 17.0 g/dL   HCT 33.9 (L) 39.0 - 52.0 %   MCV 94.4 80.0 - 100.0 fL   MCH 31.2 26.0 - 34.0 pg   MCHC 33.0 30.0 - 36.0 g/dL   RDW 13.6 11.5 - 15.5 %   Platelets 86 (L) 150 - 400 K/uL    Comment: REPEATED TO VERIFY Immature Platelet Fraction may be clinically indicated, consider ordering this additional test [id number] CONSISTENT WITH PREVIOUS RESULT    nRBC 0.0 0.0 - 0.2 %    Comment: Performed at [facility], [place] [address], [place], [place]  [address]  Brain natriuretic peptide     Status: Abnormal   Collection Time: 02/02/18  5:37 AM  Result Value Ref Range   B Natriuretic Peptide 605.2 (H) 0.0 - 100.0 pg/mL    Comment: Performed at [facility] [address], [place], [place] [address]  I-stat troponin, ED     Status: None   Collection Time: 02/02/18  5:54 AM  Result Value Ref Range   Troponin i, poc 0.04 0.00 - 0.08 ng/mL   Comment 3            Comment: Due to the release kinetics of cTnI, a negative result within the first hours of the onset of symptoms does not rule out myocardial infarction with certainty. If myocardial infarction is still suspected, repeat the test at appropriate intervals.   Troponin I (q 6hr x 3)     Status: Abnormal   Collection Time: 02/02/18  8:54 AM  Result Value Ref Range   Troponin I 0.03 (HH) <0.03 ng/mL    Comment: CRITICAL RESULT CALLED TO, READ BACK BY AND VERIFIED WITH: K.CIRK,RN 02/02/18 1014 DAVISB Performed at [facility] [address], [place], [place] [address]   Magnesium     Status: Abnormal   Collection Time: 02/02/18  8:54 AM  Result Value Ref Range   Magnesium 1.6 (L) 1.7 - 2.4 mg/dL    Comment: Performed at [facility] [address], [place],  [address]  Urinalysis, Routine w reflex microscopic     Status: None   Collection Time: 02/02/18  9:07 AM  Result Value Ref Range   Color, Urine YELLOW YELLOW   APPearance CLEAR CLEAR   Specific Gravity, Urine 1.008 1.005 - 1.030   pH 6.0 5.0 - 8.0   Glucose, UA NEGATIVE NEGATIVE mg/dL   Hgb urine dipstick NEGATIVE NEGATIVE   Bilirubin Urine NEGATIVE NEGATIVE   Ketones, ur NEGATIVE NEGATIVE mg/dL   Protein, ur NEGATIVE NEGATIVE mg/dL   Nitrite NEGATIVE NEGATIVE   Leukocytes, UA NEGATIVE NEGATIVE    Comment: Performed at [facility] [address] [place], [place] [address]  Troponin I (q 6hr x 3)     Status: Abnormal   Collection Time: 02/02/18  1:57 PM  Result Value Ref Range    Troponin I 0.04 (HH) <0.03 ng/mL    Comment: CRITICAL VALUE NOTED.  VALUE IS CONSISTENT WITH PREVIOUSLY REPORTED AND CALLED VALUE. Performed at [facility],  [address], [place], [place] [address]   Respiratory Panel by PCR     Status: None   Collection Time: 02/02/18  2:54 PM  Result Value Ref Range   Adenovirus NOT DETECTED NOT DETECTED   Coronavirus 229E NOT DETECTED NOT DETECTED   Coronavirus HKU1 NOT DETECTED NOT DETECTED   Coronavirus NL63 NOT DETECTED NOT DETECTED   Coronavirus OC43 NOT DETECTED NOT DETECTED   Metapneumovirus NOT DETECTED NOT DETECTED   Rhinovirus / Enterovirus NOT DETECTED NOT DETECTED   Influenza A NOT DETECTED NOT DETECTED   Influenza B NOT DETECTED NOT DETECTED   Parainfluenza Virus 1 NOT DETECTED NOT DETECTED   Parainfluenza Virus 2 NOT DETECTED NOT DETECTED   Parainfluenza Virus 3 NOT DETECTED NOT DETECTED   Parainfluenza Virus 4 NOT DETECTED NOT DETECTED   Respiratory Syncytial Virus NOT DETECTED NOT DETECTED   Bordetella pertussis NOT DETECTED NOT DETECTED   Chlamydophila pneumoniae NOT DETECTED NOT DETECTED   Mycoplasma pneumoniae NOT DETECTED NOT DETECTED  APTT     Status: Abnormal   Collection Time: 02/02/18  5:53 PM  Result Value Ref Range   aPTT 89 (H) 24 - 36 seconds    Comment:        IF BASELINE aPTT IS ELEVATED, SUGGEST PATIENT RISK ASSESSMENT BE USED TO DETERMINE APPROPRIATE ANTICOAGULANT THERAPY. Performed at [facility], [place] [address], [place], [place] [address]   Heparin level (unfractionated)     Status: Abnormal   Collection Time: 02/02/18  5:53 PM  Result Value Ref Range   Heparin Unfractionated 2.02 (H) 0.30 - 0.70 IU/mL    Comment: RESULTS CONFIRMED BY MANUAL DILUTION Performed at [facility], [place] [address], [place], [place] [address]   Troponin I (q 6hr x 3)     Status: Abnormal   Collection Time: 02/02/18  7:53 PM  Result Value Ref Range   Troponin I 0.03 (HH) <0.03 ng/mL    Comment: CRITICAL  RESULT CALLED TO, READ BACK BY AND VERIFIED WITH: Armida Sans RN [id number] GREEN R Performed at [facility], [place] [address] [place], [place] [address]   Magnesium     Status: Abnormal   Collection Time: 02/03/18  5:24 AM  Result Value Ref Range   Magnesium 1.6 (L) 1.7 - 2.4 mg/dL    Comment: Performed at [facility] [address], [place], [place] [address]  Phosphorus     Status: Abnormal   Collection Time: 02/03/18  5:24 AM  Result Value Ref Range   Phosphorus 2.4 (L) 2.5 - 4.6 mg/dL    Comment: Performed at [facility] [address] [place], [place] [address]  Comprehensive metabolic panel     Status: Abnormal   Collection Time: 02/03/18  5:24 AM  Result Value Ref Range   Sodium 136 135 - 145 mmol/L   Potassium 3.6 3.5 - 5.1 mmol/L   Chloride 109 98 - 111 mmol/L   CO2 21 (L) 22 - 32 mmol/L   Glucose, Bld 110 (H) 70 - 99 mg/dL   BUN 11 8 - 23 mg/dL   Creatinine, Ser 1.13 0.61 - 1.24 mg/dL   Calcium 8.1 (L) 8.9 - 10.3 mg/dL   Total Protein 5.7 (L) 6.5 - 8.1 g/dL   Albumin 3.1 (L) 3.5 - 5.0 g/dL   AST 23 15 - 41 U/L   ALT 15 0 - 44 U/L   Alkaline Phosphatase 106 38 - 126 U/L  Total Bilirubin 1.2 0.3 - 1.2 mg/dL   GFR calc non Af Amer 56 (L) >60 mL/min   GFR calc Af Amer >60 >60 mL/min    Comment: (NOTE) The eGFR has been calculated using the CKD EPI equation. This calculation has not been validated in all clinical situations. eGFR's persistently <60 mL/min signify possible Chronic Kidney Disease.    Anion gap 6 5 - 15    Comment: Performed at [facility] [address], [place], [place] [address]  CBC     Status: Abnormal   Collection Time: 02/03/18  5:24 AM  Result Value Ref Range   WBC 5.0 4.0 - 10.5 K/uL   RBC 3.59 (L) 4.22 - 5.81 MIL/uL   Hemoglobin 11.1 (L) 13.0 - 17.0 g/dL   HCT 33.6 (L) 39.0 - 52.0 %   MCV 93.6 80.0 - 100.0 fL   MCH 30.9 26.0 - 34.0 pg   MCHC 33.0 30.0 - 36.0 g/dL   RDW 13.7 11.5 - 15.5 %   Platelets 82  (L) 150 - 400 K/uL    Comment: REPEATED TO VERIFY Immature Platelet Fraction may be clinically indicated, consider ordering this additional test [id number] CONSISTENT WITH PREVIOUS RESULT    nRBC 0.0 0.0 - 0.2 %    Comment: Performed at [facility], [place] [address] [place], [place] [address]  Lipid panel     Status: Abnormal   Collection Time: 02/03/18  5:24 AM  Result Value Ref Range   Cholesterol 75 0 - 200 mg/dL   Triglycerides 60 <150 mg/dL   HDL 28 (L) >40 mg/dL   Total CHOL/HDL Ratio 2.7 RATIO   VLDL 12 0 - 40 mg/dL   LDL Cholesterol 35 0 - 99 mg/dL    Comment:        Total Cholesterol/HDL:CHD Risk Coronary Heart Disease Risk Table                     Men   Women  1/2 Average Risk   3.4   3.3  Average Risk       5.0   4.4  2 X Average Risk   9.6   7.1  3 X Average Risk  23.4   11.0        Use the calculated Patient Ratio above and the CHD Risk Table to determine the patient's CHD Risk.        ATP III CLASSIFICATION (LDL):  <100     mg/dL   Optimal  100-129  mg/dL   Near or Above                    Optimal  130-159  mg/dL   Borderline  160-189  mg/dL   High  >190     mg/dL   Very High Performed at [facility] [address], [place], [place] [address]   Heparin level (unfractionated)     Status: Abnormal   Collection Time: 02/03/18  5:24 AM  Result Value Ref Range   Heparin Unfractionated 1.20 (H) 0.30 - 0.70 IU/mL    Comment: RESULTS CONFIRMED BY MANUAL DILUTION Performed at [facility], [place] [address], [place], [place] [address]   APTT     Status: Abnormal   Collection Time: 02/03/18  5:24 AM  Result Value Ref Range   aPTT >200 (HH) 24 - 36 seconds    Comment:        IF  BASELINE aPTT IS ELEVATED, SUGGEST PATIENT RISK ASSESSMENT BE USED TO DETERMINE APPROPRIATE ANTICOAGULANT THERAPY. REPEATED TO VERIFY CRITICAL RESULT CALLED TO, READ BACK BY AND VERIFIED WITH: RN Bernita Raisin AT 9798 02/03/18 BY L BENFIELD Performed at [facility], [place] [address], [place], [place] [address]     ECG   N/A  Telemetry   Sinus rhythm - Personally Reviewed  Radiology    Dg Chest 2 View  Result Date: 02/02/2018 CLINICAL DATA:  Initial evaluation for acute chest pain, shortness of breath. EXAM: CHEST - 2 VIEW COMPARISON:  Prior radiograph from 01/31/2018. FINDINGS: Median sternotomy wires noted. Underlying cardiomegaly, stable. Mediastinal silhouette normal. Aortic atherosclerosis. Lungs mildly hypoinflated. Streaky bibasilar linear opacities favored to reflect atelectatic changes. No consolidative airspace opacity. Veiling opacities overlying the bilateral lung bases likely reflect small layering pleural effusions, left greater than right. Perihilar vascular congestion without overt pulmonary edema. No pneumothorax. No acute osseus abnormality. IMPRESSION: 1. Cardiomegaly with mild perihilar vascular congestion and small layering bilateral pleural effusions, left greater than right. 2. Superimposed bibasilar atelectatic changes. 3. Aortic atherosclerosis. Electronically Signed   By: Jeannine Boga M.D.   On: 02/02/2018 06:31   Ct Angio Chest Pe W Or Wo Contrast  Result Date: 02/02/2018 CLINICAL DATA:  Chest pain, shortness of breath. History of right lung cancer. EXAM: CT ANGIOGRAPHY CHEST WITH CONTRAST TECHNIQUE: Multidetector CT imaging of the chest was performed using the standard protocol during bolus administration of intravenous contrast. Multiplanar CT image reconstructions and MIPs were obtained to evaluate the vascular anatomy. CONTRAST:  139m ISOVUE-370 IOPAMIDOL (ISOVUE-370) INJECTION 76% COMPARISON:  Radiographs of same day.  CT scan of January 04, 2018. FINDINGS: Cardiovascular: Atherosclerosis of thoracic aorta is noted without aneurysm formation. Filling defect is seen in peripheral lower lobe branch of right pulmonary artery concerning for pulmonary embolus. Mild cardiomegaly is noted no pericardial effusion is  noted. Status post coronary artery bypass graft. Mediastinum/Nodes: Thyroid gland and esophagus are unremarkable. 15 mm right paratracheal lymph node is noted which is enlarged compared to prior exam. Stable small right hilar lymph node is noted. Stable 1 cm subcarinal lymph node is noted. Lungs/Pleura: Minimal bilateral pleural effusions are noted. No pneumothorax is noted. Stable right posterior basilar opacity is noted which may represent atelectasis, infiltrate or possibly radiation fibrosis. Stable right upper lobe scarring is noted. Upper Abdomen: No acute abnormality. Musculoskeletal: No chest wall abnormality. No acute or significant osseous findings. Review of the MIP images confirms the above findings. IMPRESSION: Acute embolus is noted in peripheral lower lobe branch of right pulmonary artery. Critical Value/emergent results were called by telephone at the time of interpretation on 02/02/2018 at 10:59 am to Dr. AToy Baker, who verbally acknowledged these results. Right paratracheal lymph node measures 15 mm which is enlarged compared to prior exam; metastatic disease cannot be excluded. Other mediastinal lymph nodes are unchanged. Minimal bilateral pleural effusions are noted. Stable right posterior basilar opacity is noted which may represent atelectasis, infiltrate or possibly radiation fibrosis. Aortic Atherosclerosis (ICD10-I70.0). Electronically Signed   By: JMarijo Conception M.D.   On: 02/02/2018 10:59    Cardiac Studies   LV EF: 60% -   65%  ------------------------------------------------------------------- Indications:      CHF - 428.0.  ------------------------------------------------------------------- History:   PMH:  Elevated troponin  Chest pain.  Risk factors: NSTEMI Hypertension. Dyslipidemia.  ------------------------------------------------------------------- Study Conclusions  - Left ventricle: The cavity size was normal. Wall thickness was   increased in a  pattern of mild LVH. Systolic function was normal.   The estimated ejection fraction was in the range of 60% to 65%.   Wall motion was normal; there were no regional wall motion   abnormalities. - Aortic valve: There was mild stenosis. Valve area (VTI): 1.68   cm^2. Valve area (Vmax): 1.4 cm^2. Valve area (Vmean): 1.34 cm^2. - Left atrium: The atrium was moderately dilated. - Right atrium: The atrium was moderately dilated. - Pulmonary arteries: PA peak pressure: 58 mm Hg (S).  Assessment   1. Active Problems: 2.   GERD (gastroesophageal reflux disease) 3.   Hyperlipidemia 4.   Hypertension 5.   Arthritis 6.   BPH (benign prostatic hyperplasia) 7.   Esophageal ulcer without bleeding 8.   Essential hypertension 9.   Adenocarcinoma of right lung ([place]) 10.   Atrial fibrillation ([place]) 11.   Pleurisy 12.   Chest pain at rest 13.   Chest pain 14.   Thrombocytopenia ([place]) 15.   Hypokalemia 16.   Pulmonary embolism ([place]) 17.   Elevated troponin 18.   Pulmonary embolus ([place]) 19.   Plan   1. Preliminary finding of DVT noted - PE noted on CT, likely cause of chest pain. Echo is reassuring without right heart strain. D/w hospitalist Dr. Evangeline Gula - she is going to transition to lovenox. No further cardiac work-up at this point. Can follow-up with Dr. Irish Lack after hospitalization.  [facility] will sign off.   Medication Recommendations:  Current cardiac meds Other recommendations (labs, testing, etc):  Lovenox to replace Eliquis Follow up as an outpatient:  Dr. Irish Lack   Time Spent Directly with Patient:  I have spent a total of 15 minutes with the patient reviewing hospital notes, telemetry, EKGs, labs and examining the patient as well as establishing an assessment and plan that was discussed personally with the patient.  > 50% of time was spent in direct patient care.  Length of Stay:  LOS: 0 days   Pixie Casino, MD, [facility], [place]  Director of the Advanced Lipid Disorders &  Cardiovascular Risk Reduction Clinic Diplomate of the American Board of Clinical Lipidology Attending Cardiologist  Direct Dial: [phone]  Fax: [phone]  Website:  [email or web address] 02/03/2018, 10:04 AM

## 2018-02-03 NOTE — Progress Notes (Signed)
Spoke with pharm regarding transition to Lovenox and PTT. Pharm has ordered a stat PTT as we think the draw earlier was near the infusion site. Dr. Evangeline Gula plans to strop Heparin and order pharm to provide treatment dose of Lovenox. Rod Holler at [facility] will contact provider for orders after PTT results.

## 2018-02-03 NOTE — Evaluation (Signed)
Occupational Therapy Evaluation Patient Details Name: Evan [name] MRN: [id number] DOB: 1927-09-04 Today's Date: 02/03/2018    History of Present Illness This 84 y.o. male admitted with increased SOB and Lt sided chest pain.  He was found to have new PE and DVT LT LE.  PMH includes:  HTN, BPH, adenocarcinoma of the lung, A-Fib,     Clinical Impression   Patient evaluated by Occupational Therapy with no further acute OT needs identified. All education has been completed and the patient has no further questions. Pt is able to perform ADLs with min guard assist.  He does fatigue quickly.  DOE 3/4, 02 sats 96% on RA, HR 108.   See below for any follow-up Occupational Therapy or equipment needs. OT is signing off. Thank you for this referral.   Recommend HHPT as pt with mild balance deficits. Recommend pt use RW at discharge, sit to shower, and have 24 hour supervision/assist initially.       Follow Up Recommendations  Other (comment);Supervision/Assistance - 24 hour(HHPT )    Equipment Recommendations  None recommended by OT    Recommendations for Other Services       Precautions / Restrictions Precautions Precautions: Fall      Mobility Bed Mobility Overal bed mobility: Independent                Transfers Overall transfer level: Needs assistance Equipment used: None Transfers: Sit to/from [facility] Sit to Stand: Min guard Stand pivot transfers: Min guard       General transfer comment: min guard for safety     Balance Overall balance assessment: Needs assistance Sitting-balance support: Feet supported Sitting balance-[name] Scale: Good     Standing balance support: No upper extremity supported Standing balance-[name] Scale: Fair Standing balance comment: able to maintain static standing ~4 min with min guard assit.  no LOB                            ADL either performed or assessed with clinical judgement   ADL Overall ADL's :  Needs assistance/impaired Eating/Feeding: Independent   Grooming: Wash/dry hands;Wash/dry face;Oral care;Brushing hair;Min guard;Standing   Upper Body Bathing: Set up;Supervision/ safety;Sitting   Lower Body Bathing: Min guard;Sit to/from stand   Upper Body Dressing : Set up;Supervision/safety;Sitting   Lower Body Dressing: Min guard;Sit to/from stand   Toilet Transfer: Min guard;Ambulation;Comfort height toilet;Grab bars   Toileting- Clothing Manipulation and Hygiene: Min guard;Sit to/from stand       Functional mobility during ADLs: Min guard General ADL Comments: Pt mildly unsteady.   DOE 3/4      Vision         Perception     Praxis      Pertinent Vitals/Pain Pain Assessment: Faces Faces Pain Scale: Hurts little more Pain Location: Rt LE  Pain Descriptors / Indicators: Heaviness Pain Intervention(s): Monitored during session     Hand Dominance Right   Extremity/Trunk Assessment Upper Extremity Assessment Upper Extremity Assessment: Overall WFL for tasks assessed   Lower Extremity Assessment Lower Extremity Assessment: Generalized weakness   Cervical / Trunk Assessment Cervical / Trunk Assessment: Normal   Communication Communication Communication: HOH   Cognition Arousal/Alertness: Awake/alert Behavior During Therapy: WFL for tasks assessed/performed Overall Cognitive Status: Within Functional Limits for tasks assessed  General Comments  HR 108, 02 sats 96% on RA with activity DOE 3/4.   Pt instructed to use RW at home, sit to shower, take rest breaks, pace self.  Family will provide 24 hour assist/supervision     Exercises     Shoulder Instructions      Home Living Family/patient expects to be discharged to:: Private residence Living Arrangements: Children Available Help at Discharge: Family;Available 24 hours/day Type of Home: House Home Access: Stairs to enter [employer] of  Steps: ~4   Home Layout: Two level;Able to live on main level with bedroom/bathroom;Laundry or work area in basement     [employer] Shower/Tub: Occupational psychologist: Handicapped height Bathroom Accessibility: Yes   Home Equipment: Environmental consultant - 2 wheels;Shower seat;Cane - single point;Grab bars - tub/shower          Prior Functioning/Environment Level of Independence: Independent        Comments: Pt and family deny falls.  He ambulates without AD, and still drives occasionally         OT Problem List: Decreased strength;Decreased activity tolerance      OT Treatment/Interventions:      OT Goals(Current goals can be found in the care plan section) Acute Rehab OT Goals Patient Stated Goal: to get my energy level back up  OT Goal Formulation: All assessment and education complete, DC therapy  OT Frequency:     Barriers to D/C:            Co-evaluation              AM-PAC PT "6 Clicks" Daily Activity     Outcome Measure Help from another person eating meals?: None Help from another person taking care of personal grooming?: None Help from another person toileting, which includes using toliet, bedpan, or urinal?: A Little Help from another person bathing (including washing, rinsing, drying)?: A Little Help from another person to put on and taking off regular upper body clothing?: None Help from another person to put on and taking off regular lower body clothing?: A Little 6 Click Score: 21   End of Session Nurse Communication: Mobility status  Activity Tolerance: Patient tolerated treatment well Patient left: in bed;with call bell/phone within reach;with family/visitor present  OT Visit Diagnosis: Unsteadiness on feet (R26.81)                Time: 2197-5883 OT Time Calculation (min): 19 min Charges:  OT General Charges $OT Visit: 1 Visit OT Evaluation $OT Eval Moderate Complexity: 1 Mod  Lucille Passy, OTR/L Acute Rehabilitation Services Pager  [phone] Office [phone]   Lucille Passy M 02/03/2018, 2:10 PM

## 2018-02-03 NOTE — Progress Notes (Addendum)
Notified admitting and pharmacy about critical PTT result. No new orders at this time. Pharmacy reports they will call and follow up.

## 2018-02-03 NOTE — Discharge Summary (Signed)
Physician Discharge Summary  [name] WLN:[id number] DOB: 1927-06-12 DOA: 02/02/2018  PCP: [name], MD  Admit date: 02/02/2018 Discharge date: 02/03/2018  Admitted From: Home with home health Disposition: Home  Recommendations for Outpatient Follow-up:  1. Follow up with PCP in 1 weeks, [name] 2. Dr. Lacie Draft in the next 2 weeks 3. Make and keep appointment with Dr. Julien Nordmann to be seen Tuesday or Wednesday of next week (4 days from now) 4. Please obtain BMP/CBC in one week   Home Health: Yes Equipment/Devices: None  Discharge Condition: Stable CODE STATUS: Currently full code recommend goals of care discussion with primary care Diet recommendation: Regular diet  Brief/Interim Summary: Evan [name] a 84 y.o.malewith a history of CAD s/p CABG x2 in 1998 with patent stents and grafts on cardiac catheterization in 06/2017, atrial fibrillation/flutter on Eliquis, chronic diastolic heart failure, and lung cancer, who found to have acute PE yesterday and today found to have DVT in the left peroneal vein.  He has missed some doses of his anticoagulation on Eliquis prior to admission.  His echocardiogram yesterday showed an LVEF of 60 to 65% without regional wall motion abnormalities.  He did have some moderate right atrial enlargement but normal RV size and function no right heart strain noted.  Pulmonary embolism was noted to be peripheral.  Dr. Lew Dawes office has been notified of the pulmonary embolism and the patient is instructed to follow-up with his office either Tuesday or Wednesday of this upcoming week.  Patient and his wife are being educated on Lovenox injections prior to discharge.  Did receive 24 hours of heparin.  Home health has been consulted for assistance as well.  Patient has reached maximal benefit of hospitalization.  Discharge diagnosis, prognosis, plans, follow-up, medications and treatments discussed with the patient(or responsible party) and is in  agreement with the plans as described.  Patient is stable for discharge.  Discharge Diagnoses:  Active Problems:   GERD (gastroesophageal reflux disease)   Hyperlipidemia   Hypertension   Arthritis   BPH (benign prostatic hyperplasia)   Esophageal ulcer without bleeding   Essential hypertension   Adenocarcinoma of right lung (HCC)   Atrial fibrillation (HCC)   Pleurisy   Chest pain at rest   Chest pain   Thrombocytopenia (HCC)   Hypokalemia   Pulmonary embolism (HCC)   Elevated troponin   Pulmonary embolus [facility])    Discharge Instructions  Discharge Instructions    Diet - low sodium heart healthy   Complete by:  As directed    Increase activity slowly   Complete by:  As directed      Allergies as of 02/03/2018      Reactions   Feldene [piroxicam] Other (See Comments)   REACTION: Blisters   Statins Other (See Comments)   Myalgias   Doxycycline Other (See Comments)   REACTION:  unknown   Latex Rash   Tequin Anxiety   Valium Other (See Comments)   REACTION:  unknown      Medication List    STOP taking these medications   ELIQUIS 5 MG Tabs tablet Generic drug:  apixaban     TAKE these medications   amoxicillin-clavulanate 875-125 MG tablet Commonly known as:  AUGMENTIN Take 1 tablet by mouth every 12 (twelve) hours.   Cholecalciferol 2000 units Tabs Take 1 tablet (2,000 Units total) by mouth daily.   enoxaparin 80 MG/0.8ML injection Commonly known as:  LOVENOX Inject 0.8 mLs (80 mg total) into the skin  every 12 (twelve) hours.   ENSURE Take 237 mLs by mouth 2 (two) times daily between meals.   FLUoxetine 20 MG capsule Commonly known as:  PROZAC TAKE ONE CAPSULE BY MOUTH DAILY What changed:    how much to take  how to take this  when to take this  additional instructions   furosemide 20 MG tablet Commonly known as:  LASIX Take 1 tablet (20 mg total) once a day. Take an extra tablet (20 mg total) for increased swelling, weight gain, or  shortness of breath. What changed:    how much to take  how to take this  when to take this  additional instructions   ipratropium-albuterol 0.5-2.5 (3) MG/3ML Soln Commonly known as:  DUONEB Take 3 mLs by nebulization every 4 (four) hours as needed (or Chest Pain / Tightness).   leflunomide 10 MG tablet Commonly known as:  ARAVA Take 10 mg by mouth daily.   LORazepam 1 MG tablet Commonly known as:  ATIVAN TAKE ONE TABLET BY MOUTH EVERY EIGHT HOURS AS NEEDED FOR ANXIETY What changed:  See the new instructions.   magnesium oxide 400 (241.3 Mg) MG tablet Commonly known as:  MAG-OX Take 1 tablet (400 mg total) by mouth 2 (two) times daily.   metoprolol succinate 25 MG 24 hr tablet Commonly known as:  TOPROL-XL TAKE ONE (1) TABLET BY MOUTH EVERY DAY What changed:  See the new instructions.   nitroGLYCERIN 0.4 MG SL tablet Commonly known as:  NITROSTAT Place 1 tablet (0.4 mg total) under the tongue every 5 (five) minutes as needed for chest pain.   pantoprazole 40 MG tablet Commonly known as:  PROTONIX Take one (1) tablet (40 mg) by mouth daily. What changed:    how much to take  how to take this  when to take this  additional instructions   rosuvastatin 5 MG tablet Commonly known as:  CRESTOR TAKE 5 mg  EVERY DAY What changed:    how much to take  how to take this  when to take this  additional instructions   tamsulosin 0.4 MG Caps capsule Commonly known as:  FLOMAX TAKE 1 CAPSULE EVERY DAY What changed:    how much to take  how to take this  when to take this  additional instructions   XIIDRA 5 % Soln Generic drug:  Lifitegrast Place 1-2 drops into both eyes daily.      Follow-up Information    [name], MD Follow up in 1 week(s).   Specialty:  Family Medicine Contact information: [address] [place] [address] [phone]        Jettie Booze, MD Follow up in 2 week(s).   Specialties:   Cardiology, Radiology, Interventional Cardiology Contact information: [address] [place] [address] [phone]        Curt Bears, MD. Schedule an appointment as soon as possible for a visit in 4 day(s).   Specialty:  Oncology Contact information: [address] [place] [address] [phone]          Allergies  Allergen Reactions  . Feldene [Piroxicam] Other (See Comments)    REACTION: Blisters  . Statins Other (See Comments)    Myalgias  . Doxycycline Other (See Comments)    REACTION:  unknown  . Latex Rash  . Tequin Anxiety  . Valium Other (See Comments)    REACTION:  unknown    Consultations:  [facility] cardiology  primary cardiology is Dr. Irish Lack   Procedures/Studies: Dg Chest 2 View  Result Date: 02/02/2018 CLINICAL DATA:  Initial evaluation for acute chest pain, shortness of breath. EXAM: CHEST - 2 VIEW COMPARISON:  Prior radiograph from 01/31/2018. FINDINGS: Median sternotomy wires noted. Underlying cardiomegaly, stable. Mediastinal silhouette normal. Aortic atherosclerosis. Lungs mildly hypoinflated. Streaky bibasilar linear opacities favored to reflect atelectatic changes. No consolidative airspace opacity. Veiling opacities overlying the bilateral lung bases likely reflect small layering pleural effusions, left greater than right. Perihilar vascular congestion without overt pulmonary edema. No pneumothorax. No acute osseus abnormality. IMPRESSION: 1. Cardiomegaly with mild perihilar vascular congestion and small layering bilateral pleural effusions, left greater than right. 2. Superimposed bibasilar atelectatic changes. 3. Aortic atherosclerosis. Electronically Signed   By: Jeannine Boga M.D.   On: 02/02/2018 06:31   Dg Chest 2 View  Result Date: 01/31/2018 CLINICAL DATA:  Productive cough for the past 2-3 weeks. History of lung cancer. EXAM: CHEST - 2 VIEW COMPARISON:  CT chest dated January 04, 2018. Chest  x-ray dated December 02, 2017. FINDINGS: Stable mild cardiomegaly status post CABG. Coarsened interstitial markings are similar to prior studies. Bibasilar atelectasis/scarring is unchanged. No focal consolidation, pleural effusion, or pneumothorax. No acute osseous abnormality. IMPRESSION: No active cardiopulmonary disease. Electronically Signed   By: Titus Dubin M.D.   On: 01/31/2018 11:46   Ct Angio Chest Pe W Or Wo Contrast  Result Date: 02/02/2018 CLINICAL DATA:  Chest pain, shortness of breath. History of right lung cancer. EXAM: CT ANGIOGRAPHY CHEST WITH CONTRAST TECHNIQUE: Multidetector CT imaging of the chest was performed using the standard protocol during bolus administration of intravenous contrast. Multiplanar CT image reconstructions and MIPs were obtained to evaluate the vascular anatomy. CONTRAST:  147mL ISOVUE-370 IOPAMIDOL (ISOVUE-370) INJECTION 76% COMPARISON:  Radiographs of same day.  CT scan of January 04, 2018. FINDINGS: Cardiovascular: Atherosclerosis of thoracic aorta is noted without aneurysm formation. Filling defect is seen in peripheral lower lobe branch of right pulmonary artery concerning for pulmonary embolus. Mild cardiomegaly is noted no pericardial effusion is noted. Status post coronary artery bypass graft. Mediastinum/Nodes: Thyroid gland and esophagus are unremarkable. 15 mm right paratracheal lymph node is noted which is enlarged compared to prior exam. Stable small right hilar lymph node is noted. Stable 1 cm subcarinal lymph node is noted. Lungs/Pleura: Minimal bilateral pleural effusions are noted. No pneumothorax is noted. Stable right posterior basilar opacity is noted which may represent atelectasis, infiltrate or possibly radiation fibrosis. Stable right upper lobe scarring is noted. Upper Abdomen: No acute abnormality. Musculoskeletal: No chest wall abnormality. No acute or significant osseous findings. Review of the MIP images confirms the above findings.  IMPRESSION: Acute embolus is noted in peripheral lower lobe branch of right pulmonary artery. Critical Value/emergent results were called by telephone at the time of interpretation on 02/02/2018 at 10:59 am to Dr. Toy Baker , who verbally acknowledged these results. Right paratracheal lymph node measures 15 mm which is enlarged compared to prior exam; metastatic disease cannot be excluded. Other mediastinal lymph nodes are unchanged. Minimal bilateral pleural effusions are noted. Stable right posterior basilar opacity is noted which may represent atelectasis, infiltrate or possibly radiation fibrosis. Aortic Atherosclerosis (ICD10-I70.0). Electronically Signed   By: Marijo Conception, M.D.   On: 02/02/2018 10:59    Echocardiogram shows: - Left ventricle: The cavity size was normal. Wall thickness was   increased in a pattern of mild LVH. Systolic function was normal.   The estimated ejection fraction was  in the range of 60% to 65%.   Wall motion was normal; there were no regional wall motion   abnormalities. - Aortic valve: There was mild stenosis. Valve area (VTI): 1.68   cm^2. Valve area (Vmax): 1.4 cm^2. Valve area (Vmean): 1.34 cm^2. - Left atrium: The atrium was moderately dilated. - Right atrium: The atrium was moderately dilated. - Pulmonary arteries: PA peak pressure: 58 mm Hg (S).  Bilateral lower extremity venous duplex shows acute DVT in the left peroneal vein   Subjective: No new complaints.  Wife with many questions about injections.  Discharge Exam: Vitals:   02/03/18 0522 02/03/18 1234  BP: (!) 133/99 (!) 159/97  Pulse: 83 82  Resp: 20 20  Temp: 98.4 F (36.9 C) 98.1 F (36.7 C)  SpO2: 94% 95%   Vitals:   02/02/18 2023 02/02/18 2336 02/03/18 0522 02/03/18 1234  BP: (!) 160/86 (!) 132/59 (!) 133/99 (!) 159/97  Pulse: 94 85 83 82  Resp: 20 (!) 22 20 20   Temp: 98.5 F (36.9 C) 98 F (36.7 C) 98.4 F (36.9 C) 98.1 F (36.7 C)  TempSrc: Oral Oral Oral Oral   SpO2: 96% 93% 94% 95%  Weight:   81.4 kg   Height:        General: Pt is alert, awake, not in acute distress Cardiovascular: RRR, S1/S2 +, no rubs, no gallops Respiratory: CTA bilaterally, no wheezing, no rhonchi Abdominal: Soft, NT, ND, bowel sounds + Extremities: no edema, no cyanosis    The results of significant diagnostics from this hospitalization (including imaging, microbiology, ancillary and laboratory) are listed below for reference.     Microbiology: Recent Results (from the past 240 hour(s))  Blood culture (routine x 2)     Status: None (Preliminary result)   Collection Time: 01/31/18  1:00 PM  Result Value Ref Range Status   Specimen Description BLOOD RIGHT ANTECUBITAL  Final   Special Requests   Final    BOTTLES DRAWN AEROBIC AND ANAEROBIC Blood Culture results may not be optimal due to an excessive volume of blood received in culture bottles   Culture   Final    NO GROWTH 3 DAYS Performed at [facility], [place] [address], [place], [place] [address]    Report Status PENDING  Incomplete  Blood culture (routine x 2)     Status: None (Preliminary result)   Collection Time: 01/31/18  1:25 PM  Result Value Ref Range Status   Specimen Description BLOOD RIGHT WRIST  Final   Special Requests   Final    BOTTLES DRAWN AEROBIC AND ANAEROBIC Blood Culture adequate volume   Culture   Final    NO GROWTH 3 DAYS Performed at [facility], [place] [address] [place], [place] [address]    Report Status PENDING  Incomplete  Urine Culture     Status: None   Collection Time: 02/02/18  9:07 AM  Result Value Ref Range Status   Specimen Description URINE, CLEAN CATCH  Final   Special Requests NONE  Final   Culture   Final    NO GROWTH 1 DAY Performed at [facility], [place] [address], [place], [place] [address]    Report Status 02/03/2018 FINAL  Final  Respiratory Panel by PCR     Status: None   Collection Time: 02/02/18  2:54 PM  Result Value Ref Range Status    Adenovirus NOT DETECTED NOT DETECTED Final   Coronavirus 229E NOT DETECTED NOT DETECTED  Final   Coronavirus HKU1 NOT DETECTED NOT DETECTED Final   Coronavirus NL63 NOT DETECTED NOT DETECTED Final   Coronavirus OC43 NOT DETECTED NOT DETECTED Final   Metapneumovirus NOT DETECTED NOT DETECTED Final   Rhinovirus / Enterovirus NOT DETECTED NOT DETECTED Final   Influenza A NOT DETECTED NOT DETECTED Final   Influenza B NOT DETECTED NOT DETECTED Final   Parainfluenza Virus 1 NOT DETECTED NOT DETECTED Final   Parainfluenza Virus 2 NOT DETECTED NOT DETECTED Final   Parainfluenza Virus 3 NOT DETECTED NOT DETECTED Final   Parainfluenza Virus 4 NOT DETECTED NOT DETECTED Final   Respiratory Syncytial Virus NOT DETECTED NOT DETECTED Final   Bordetella pertussis NOT DETECTED NOT DETECTED Final   Chlamydophila pneumoniae NOT DETECTED NOT DETECTED Final   Mycoplasma pneumoniae NOT DETECTED NOT DETECTED Final     Labs: BNP (last 3 results) Recent Labs    05/11/17 0352 12/02/17 1039 02/02/18 0537  BNP 454.7* 629.1* 397.6*   Basic Metabolic Panel: Recent Labs  Lab 01/30/18 1704 01/31/18 1146 02/02/18 0537 02/02/18 0854 02/03/18 0524  NA 140 138 136  --  136  K 4.5 3.5 3.2*  --  3.6  CL 106 108 109  --  109  CO2 18* 20* 19*  --  21*  GLUCOSE 123* 112* 114*  --  110*  BUN 28* 21 13  --  11  CREATININE 1.66* 1.33* 1.17  --  1.13  CALCIUM 9.0 8.7* 8.4*  --  8.1*  MG  --   --   --  1.6* 1.6*  PHOS  --   --   --   --  2.4*   Liver Function Tests: Recent Labs  Lab 01/30/18 1704 01/31/18 1146 02/03/18 0524  AST 24 23 23   ALT 13 16 15   ALKPHOS  --  105 106  BILITOT 1.3* 2.0* 1.2  PROT 6.3 6.1* 5.7*  ALBUMIN  --  3.4* 3.1*   No results for input(s): LIPASE, AMYLASE in the last 168 hours. No results for input(s): AMMONIA in the last 168 hours. CBC: Recent Labs  Lab 01/30/18 1704 01/31/18 1146 02/02/18 0537 02/03/18 0524  WBC 10.5 8.5 6.8 5.0  NEUTROABS 8,201* 6.4  --   --    HGB 12.1* 11.8* 11.2* 11.1*  HCT 35.8* 37.0* 33.9* 33.6*  MCV 91.8 96.9 94.4 93.6  PLT 91* 80* 86* 82*   Cardiac Enzymes: Recent Labs  Lab 02/02/18 0854 02/02/18 1357 02/02/18 1953  TROPONINI 0.03* 0.04* 0.03*   BNP: Invalid input(s): POCBNP CBG: No results for input(s): GLUCAP in the last 168 hours. D-Dimer No results for input(s): DDIMER in the last 72 hours. Hgb A1c No results for input(s): HGBA1C in the last 72 hours. Lipid Profile Recent Labs    02/03/18 0524  CHOL 75  HDL 28*  LDLCALC 35  TRIG 60  CHOLHDL 2.7   Thyroid function studies No results for input(s): TSH, T4TOTAL, T3FREE, THYROIDAB in the last 72 hours.  Invalid input(s): FREET3 Anemia work up No results for input(s): VITAMINB12, FOLATE, FERRITIN, TIBC, IRON, RETICCTPCT in the last 72 hours. Urinalysis    Component Value Date/Time   COLORURINE YELLOW 02/02/2018 0907   APPEARANCEUR CLEAR 02/02/2018 0907   LABSPEC 1.008 02/02/2018 0907   PHURINE 6.0 02/02/2018 0907   GLUCOSEU NEGATIVE 02/02/2018 0907   HGBUR NEGATIVE 02/02/2018 0907   BILIRUBINUR NEGATIVE 02/02/2018 0907   KETONESUR NEGATIVE 02/02/2018 0907   PROTEINUR NEGATIVE 02/02/2018 0907   UROBILINOGEN 0.2 07/13/2014  [place] 02/02/2018 [place] 02/02/2018 4709   Sepsis Labs Invalid input(s): PROCALCITONIN,  WBC,  LACTICIDVEN Microbiology Recent Results (from the past 240 hour(s))  Blood culture (routine x 2)     Status: None (Preliminary result)   Collection Time: 01/31/18  1:00 PM  Result Value Ref Range Status   Specimen Description BLOOD RIGHT ANTECUBITAL  Final   Special Requests   Final    BOTTLES DRAWN AEROBIC AND ANAEROBIC Blood Culture results may not be optimal due to an excessive volume of blood received in culture bottles   Culture   Final    NO GROWTH 3 DAYS Performed at [facility], [place] [address], [place], [place] [address]    Report Status PENDING  Incomplete  Blood  culture (routine x 2)     Status: None (Preliminary result)   Collection Time: 01/31/18  1:25 PM  Result Value Ref Range Status   Specimen Description BLOOD RIGHT WRIST  Final   Special Requests   Final    BOTTLES DRAWN AEROBIC AND ANAEROBIC Blood Culture adequate volume   Culture   Final    NO GROWTH 3 DAYS Performed at [facility], [place] [address], [place], [place] [address]    Report Status PENDING  Incomplete  Urine Culture     Status: None   Collection Time: 02/02/18  9:07 AM  Result Value Ref Range Status   Specimen Description URINE, CLEAN CATCH  Final   Special Requests NONE  Final   Culture   Final    NO GROWTH 1 DAY Performed at [facility], [place] [address], [place], [place] [address]    Report Status 02/03/2018 FINAL  Final  Respiratory Panel by PCR     Status: None   Collection Time: 02/02/18  2:54 PM  Result Value Ref Range Status   Adenovirus NOT DETECTED NOT DETECTED Final   Coronavirus 229E NOT DETECTED NOT DETECTED Final   Coronavirus HKU1 NOT DETECTED NOT DETECTED Final   Coronavirus NL63 NOT DETECTED NOT DETECTED Final   Coronavirus OC43 NOT DETECTED NOT DETECTED Final   Metapneumovirus NOT DETECTED NOT DETECTED Final   Rhinovirus / Enterovirus NOT DETECTED NOT DETECTED Final   Influenza A NOT DETECTED NOT DETECTED Final   Influenza B NOT DETECTED NOT DETECTED Final   Parainfluenza Virus 1 NOT DETECTED NOT DETECTED Final   Parainfluenza Virus 2 NOT DETECTED NOT DETECTED Final   Parainfluenza Virus 3 NOT DETECTED NOT DETECTED Final   Parainfluenza Virus 4 NOT DETECTED NOT DETECTED Final   Respiratory Syncytial Virus NOT DETECTED NOT DETECTED Final   Bordetella pertussis NOT DETECTED NOT DETECTED Final   Chlamydophila pneumoniae NOT DETECTED NOT DETECTED Final   Mycoplasma pneumoniae NOT DETECTED NOT DETECTED Final     Time coordinating discharge: 48 minutes  SIGNED:   Lady Deutscher, MD  FACP [facility] 02/03/2018, 1:20  PM Pager   If 7PM-7AM, please contact night-coverage [email or web address] Password [email or web address]

## 2018-02-05 ENCOUNTER — Encounter: Payer: Self-pay | Admitting: Family Medicine

## 2018-02-05 ENCOUNTER — Ambulatory Visit (INDEPENDENT_AMBULATORY_CARE_PROVIDER_SITE_OTHER): Payer: Medicare HMO | Admitting: Family Medicine

## 2018-02-05 VITALS — BP 110/60 | HR 70 | Temp 98.5°F | Resp 22 | Ht 68.0 in | Wt 181.0 lb

## 2018-02-05 DIAGNOSIS — Z09 Encounter for follow-up examination after completed treatment for conditions other than malignant neoplasm: Secondary | ICD-10-CM | POA: Diagnosis not present

## 2018-02-05 DIAGNOSIS — I2699 Other pulmonary embolism without acute cor pulmonale: Secondary | ICD-10-CM

## 2018-02-05 DIAGNOSIS — R079 Chest pain, unspecified: Secondary | ICD-10-CM | POA: Diagnosis not present

## 2018-02-05 LAB — CBC WITH DIFFERENTIAL/PLATELET
Basophils Absolute: 29 cells/uL (ref 0–200)
Basophils Relative: 0.6 %
Eosinophils Absolute: 260 cells/uL (ref 15–500)
Eosinophils Relative: 5.3 %
HCT: 34 % — ABNORMAL LOW (ref 38.5–50.0)
Hemoglobin: 11.5 g/dL — ABNORMAL LOW (ref 13.2–17.1)
Lymphs Abs: 1161 cells/uL (ref 850–3900)
MCH: 31.3 pg (ref 27.0–33.0)
MCHC: 33.8 g/dL (ref 32.0–36.0)
MCV: 92.6 fL (ref 80.0–100.0)
MPV: 13 fL — ABNORMAL HIGH (ref 7.5–12.5)
Monocytes Relative: 15.7 %
Neutro Abs: 2680 cells/uL (ref 1500–7800)
Neutrophils Relative %: 54.7 %
Platelets: 111 10*3/uL — ABNORMAL LOW (ref 140–400)
RBC: 3.67 10*6/uL — ABNORMAL LOW (ref 4.20–5.80)
RDW: 12.8 % (ref 11.0–15.0)
Total Lymphocyte: 23.7 %
WBC mixed population: 769 cells/uL (ref 200–950)
WBC: 4.9 10*3/uL (ref 3.8–10.8)

## 2018-02-05 LAB — COMPLETE METABOLIC PANEL WITH GFR
AG Ratio: 1.5 (calc) (ref 1.0–2.5)
ALT: 18 U/L (ref 9–46)
AST: 33 U/L (ref 10–35)
Albumin: 3.6 g/dL (ref 3.6–5.1)
Alkaline phosphatase (APISO): 121 U/L — ABNORMAL HIGH (ref 40–115)
BUN/Creatinine Ratio: 14 (calc) (ref 6–22)
BUN: 19 mg/dL (ref 7–25)
CO2: 22 mmol/L (ref 20–32)
Calcium: 9.4 mg/dL (ref 8.6–10.3)
Chloride: 104 mmol/L (ref 98–110)
Creat: 1.4 mg/dL — ABNORMAL HIGH (ref 0.70–1.11)
GFR, Est African American: 52 mL/min/{1.73_m2} — ABNORMAL LOW (ref >=60)
GFR, Est Non African American: 45 mL/min/{1.73_m2} — ABNORMAL LOW (ref >=60)
Globulin: 2.4 g/dL (calc) (ref 1.9–3.7)
Glucose, Bld: 118 mg/dL — ABNORMAL HIGH (ref 65–99)
Potassium: 4.5 mmol/L (ref 3.5–5.3)
Sodium: 136 mmol/L (ref 135–146)
Total Bilirubin: 0.9 mg/dL (ref 0.2–1.2)
Total Protein: 6 g/dL — ABNORMAL LOW (ref 6.1–8.1)

## 2018-02-05 NOTE — Progress Notes (Signed)
Subjective:    Patient ID: Evan [name], male    DOB: [birth year], 84 y.o.   MRN: [id number]  Illness   01/30/18 Patient walked in urgently this afternoon at 4:00 complaining of feeling weak.  Initially a temporal thermometer was used and his temperature was found to be 102.9!.  We repeated this with an oral thermometer and his temperature was found to be 98.8.  Therefore I believe the temperature was incorrect.  I repeated the oral temperature again 20 minutes later and it was 97.8.  Patient's blood pressure initially upon walking and was 98/60.  Therefore we started the patient on normal saline and he was given a 500 cc bolus.  Patient states that he was fine when he went to sleep.  When he awoke from his nap this afternoon, he felt extremely shaky and weak.  His legs felt weak.  His daughter brought him straight to the office.  He states that he has had a cough over the last week that is gradually gotten worse.  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  However he does report increasing cough and increasing weakness over the last week.  He denies any sore throat.  He denies any rhinorrhea.  He denies any otalgia.  He denies any nausea or vomiting.  He denies any chest pain.  He denies any shortness of breath above his baseline.  He denies any dysuria.  He denies any urgency or frequency.  Initial flu test was negative.  At that time, my plan was:  Exam today is unremarkable.  The initial temperature was incorrect.  Repeated oral temperatures have been in a normal range between 97.8 and 98.8.  Therefore I will disregard the initial temporal scan which said 102.9.  Patient's blood pressure has improved to 120/60 after receiving a 500 cc bolus of normal saline.  Flu test is negative.  Given the increasing cough and the crackles on exam I  will treat the patient for possible community-acquired pneumonia.  Patient was given 1 g of Rocephin IM x1 now and I will start him on a Z-Pak.  Patient will return tomorrow for reevaluation to ensure that he is in gradually improving.  CBC and CMP were sent.  Patient is instructed to go to the emergency room if symptoms worsen or change in any way.  02/05/18 Patient return the next day for follow-up.  Given worsening sided chest pain shortness of breath, he was recommended to go to the emergency room.  In the emergency room, CT scan of the chest revealed right for pulmonary embolism.  He was also found to have a left peroneal vein DVT.  He was admitted to the hospital and started on heparin and then transitioned to Lovenox 80 mg subcu twice daily for treatment of the pulmonary embolism.  It was recommended for lifelong anticoagulation given the fact the patient has a history of right-sided lung cancer and also formed DVT and pulmonary embolism while taking Eliquis.  He is here today for follow-up.  His daughter states that he may have missed 1 or 2 doses of Eliquis but he usually took it twice a day.  He was compliant more than 90% of the time.  He is reporting right-sided chest pain.  He is also reporting fatigue and shortness of breath and dyspnea on exertion.  Symptoms have not changed considerably from last week.  He denies any syncope.  He denies any palpitations. Past Medical History:  Diagnosis Date  . Adenocarcinoma of right lung ([place]) 01/06/2016  . Anginal chest pain at rest [facility])    Chronic non, controlled on Lorazepam  . Anxiety   . Arthritis    "all over"   . BPH (benign prostatic hyperplasia)   . CAD (coronary artery disease)   . Chronic lower back pain   . Complication of anesthesia    "problems making water afterwards"  . Depression   . DJD (degenerative joint disease)   . Esophageal ulcer without bleeding    bid ppi indefinitely  . Family history of adverse reaction to anesthesia     "children w/PONV"  . GERD (gastroesophageal reflux disease)   . Headache    "couple/week maybe" (09/04/2015)  . Hemochromatosis    Possible, elevated iron stores, hook-like osteophytes on hand films, normal LFTs  . Hepatitis    "yellow jaundice as a baby"  . History of ASCVD    MULTIVESSEL  . Hyperlipidemia   . Hypertension   . Myocardial infarction ([place]) 1999  . Osteoarthritis   . Pneumonia    "several times; got a little now" (09/04/2015)  . Rheumatoid arthritis ([place])   . Subdural hematoma ([facility])    small after fall 09/2016-plavix held, neurosurgery consulted.  Asymptomatic.     Past Surgical History:  Procedure Laterality Date  . ARM SKIN LESION BIOPSY / EXCISION Left 09/02/2015  . CATARACT EXTRACTION W/ INTRAOCULAR LENS  IMPLANT, BILATERAL Bilateral   . CHOLECYSTECTOMY OPEN    . CORNEAL TRANSPLANT Bilateral    "one at [facility]; one at [facility]"  . CORONARY ANGIOPLASTY WITH STENT PLACEMENT    . CORONARY ARTERY BYPASS GRAFT  1999  . DESCENDING AORTIC ANEURYSM REPAIR W/ STENT     DE stent ostium into the right radial free graft at OM-, 02-2007  . ESOPHAGOGASTRODUODENOSCOPY N/A 11/09/2014   Procedure: ESOPHAGOGASTRODUODENOSCOPY (EGD);  Surgeon: [name], MD;  Location: [facility] ENDOSCOPY;  Service: Endoscopy;  Laterality: N/A;  . INGUINAL HERNIA REPAIR    . JOINT REPLACEMENT    . LEFT HEART CATH AND CORS/GRAFTS ANGIOGRAPHY N/A 06/27/2017   Procedure: LEFT HEART CATH AND CORS/GRAFTS ANGIOGRAPHY;  Surgeon: [name], MD;  Location: [place] CV LAB;  Service: Cardiovascular;  Laterality: N/A;  . NASAL SINUS SURGERY    . SHOULDER OPEN ROTATOR CUFF REPAIR Right   . TOTAL KNEE ARTHROPLASTY Bilateral    Current Outpatient Medications on File Prior to Visit  Medication Sig Dispense Refill  . Cholecalciferol 2000 units TABS Take 1 tablet (2,000 Units total) by mouth daily. 30 each 11  . enoxaparin (LOVENOX) 80 MG/0.8ML injection Inject 0.8 mLs (80 mg total) into the skin every 12 (twelve)  hours. 30 Syringe 0  . ENSURE (ENSURE) Take 237 mLs by mouth 2 (two) times daily between meals.    Marland Kitchen FLUoxetine (PROZAC) 20 MG capsule TAKE ONE CAPSULE BY MOUTH DAILY (Patient taking differently: Take 20 mg by mouth daily. ) 90 capsule 3  . furosemide (LASIX) 20 MG tablet Take 1 tablet (20 mg total) once a day. Take an extra tablet (20 mg total) for increased swelling, weight gain, or shortness of breath. (Patient taking differently: Take 20 mg by mouth daily. May take an extra tablet (20 mg total) for increased swelling, weight gain, or shortness of breath.) 180 tablet 3  . ipratropium-albuterol (DUONEB) 0.5-2.5 (3) MG/3ML SOLN  Take 3 mLs by nebulization every 4 (four) hours as needed (or Chest Pain / Tightness). 360 mL 3  . leflunomide (ARAVA) 10 MG tablet Take 10 mg by mouth daily.    Marland Kitchen Lifitegrast (XIIDRA) 5 % SOLN Place 1-2 drops into both eyes daily.     Marland Kitchen LORazepam (ATIVAN) 1 MG tablet TAKE ONE TABLET BY MOUTH EVERY EIGHT HOURS AS NEEDED FOR ANXIETY (Patient taking differently: Take 1 mg by mouth every 8 (eight) hours as needed for anxiety. ) 90 tablet 0  . magnesium oxide (MAG-OX) 400 (241.3 Mg) MG tablet Take 1 tablet (400 mg total) by mouth 2 (two) times daily. 60 tablet 0  . metoprolol succinate (TOPROL-XL) 25 MG 24 hr tablet TAKE ONE (1) TABLET BY MOUTH EVERY DAY (Patient taking differently: Take 25 mg by mouth daily. ) 30 tablet 10  . nitroGLYCERIN (NITROSTAT) 0.4 MG SL tablet Place 1 tablet (0.4 mg total) under the tongue every 5 (five) minutes as needed for chest pain. 25 tablet 2  . pantoprazole (PROTONIX) 40 MG tablet Take one (1) tablet (40 mg) by mouth daily. (Patient taking differently: Take 40 mg by mouth daily. ) 90 tablet 3  . rosuvastatin (CRESTOR) 5 MG tablet TAKE 5 mg  EVERY DAY (Patient taking differently: Take 5 mg by mouth daily. ) 90 tablet 3  . tamsulosin (FLOMAX) 0.4 MG CAPS capsule TAKE 1 CAPSULE EVERY DAY (Patient taking differently: Take 0.4 mg by mouth daily. ) 90  capsule 3   No current facility-administered medications on file prior to visit.    Allergies  Allergen Reactions  . Feldene [Piroxicam] Other (See Comments)    REACTION: Blisters  . Statins Other (See Comments)    Myalgias  . Doxycycline Other (See Comments)    REACTION:  unknown  . Latex Rash  . Tequin Anxiety  . Valium Other (See Comments)    REACTION:  unknown   Social History   Socioeconomic History  . Marital status: Widowed    Spouse name: Not on file  . Number of children: Not on file  . Years of education: Not on file  . Highest education level: Not on file  Occupational History  . Not on file  Social Needs  . Financial resource strain: Not on file  . Food insecurity:    Worry: Not on file    Inability: Not on file  . Transportation needs:    Medical: Not on file    Non-medical: Not on file  Tobacco Use  . Smoking status: Never Smoker  . Smokeless tobacco: Former Systems developer    Types: Chew  . Tobacco comment: "quit chewing in the 1960s"  Substance and Sexual Activity  . Alcohol use: No  . Drug use: No  . Sexual activity: Never  Lifestyle  . Physical activity:    Days per week: Not on file    Minutes per session: Not on file  . Stress: Not on file  Relationships  . Social connections:    Talks on phone: Not on file    Gets together: Not on file    Attends religious service: Not on file    Active member of club or organization: Not on file    Attends meetings of clubs or organizations: Not on file    Relationship status: Not on file  . Intimate partner violence:    Fear of current or ex partner: Not on file    Emotionally abused: Not on file  Physically abused: Not on file    Forced sexual activity: Not on file  Other Topics Concern  . Not on file  Social History Narrative   01-05-18 Unable to ask abuse questions family with him today.      Review of Systems  All other systems reviewed and are negative.      Objective:   Physical Exam    Constitutional: He appears well-developed and well-nourished. No distress.  HENT:  Right Ear: External ear normal.  Left Ear: External ear normal.  Nose: Nose normal.  Mouth/Throat: Oropharynx is clear and moist. No oropharyngeal exudate.  Eyes: Conjunctivae are normal.  Neck: Neck supple. No JVD present.  Cardiovascular: Normal rate, regular rhythm and normal heart sounds.  No murmur heard. Pulmonary/Chest: Effort normal. No respiratory distress. He has no wheezes. He has rales.     He exhibits no tenderness.  Abdominal: Soft. Bowel sounds are normal. He exhibits no distension. There is no tenderness. There is no rebound and no guarding.  Musculoskeletal: He exhibits no edema.  Lymphadenopathy:    He has no cervical adenopathy.  Skin: He is not diaphoretic.  Vitals reviewed.         Assessment & Plan:  Hospital discharge follow-up - Plan: CBC with Differential/Platelet, COMPLETE METABOLIC PANEL WITH GFR  Pulmonary embolism and infarction (HCC)  Right-sided chest pain Given his history of lung cancer, and the fact that his DVT and pulmonary embolism formed while taking Eliquis, I recommended that the patient remain on Lovenox indefinitely.  He will discontinue Eliquis and use only Lovenox.  I would recommend 80 mg twice daily for at least the first 6 months.  Then to ensure compliance and for ease of administration, we may consider switching to once daily lovenox but use 1.5 mg/kg daily (120 mg).  I will follow-up lab studies as requested by the hospitalist including a CBC and CMP.

## 2018-02-06 ENCOUNTER — Telehealth: Payer: Self-pay | Admitting: *Deleted

## 2018-02-06 ENCOUNTER — Telehealth: Payer: Self-pay | Admitting: Internal Medicine

## 2018-02-06 NOTE — Telephone Encounter (Addendum)
"  Gaynelle Arabian Thakur's daughter [name] Found calling as instructed for F/U appointment with Dr. Julien Nordmann.  Discharged 02-03-2018 with instructions to see oncology in four days.  Blood clot in lung at the same area of cancer.  Waiting for return call of request I made for him to be seen yesterday or today is what the hospital instructed me to do.  Received two week supply of Enoxaparin injections to use twice a day.  Return all calls to my mobile([phone])  or home [phone]) numbers.  He does not answer his phones.  Yes, go ahead and update his phone numbers in your records with mine for all communication and appointment information."       FYI "Has been scheduled with Dr. Lake Bells on March 04, 2018."

## 2018-02-06 NOTE — Telephone Encounter (Signed)
I called and spoke with [name] regarding hosp f/u appt.  Per staff message from Dr Julien Nordmann 11/2 patient does not need to f/u with him since he is being followed by Rad Onc. I am asking [name] for more information on this patient so we can get him scheduled with the correct department.  [name] has also been notified of this information as well.Evan [name]

## 2018-02-06 NOTE — Telephone Encounter (Signed)
I tried calling Rad Onc and was unable to get anyone on the phone. I then spoke with Rad Onc and they will call the patient to schedule appt.  As for the Pulmonary Embolus Dr Julien Nordmann stated that this would need to be followed by patients Primary Care Physician.  I called patients daughter [name] and updated her on this additional information as well.  Rad Onc will call her to schedule f/u

## 2018-02-07 LAB — CULTURE, BLOOD (ROUTINE X 2)
Culture: NO GROWTH
Culture: NO GROWTH
Special Requests: ADEQUATE

## 2018-02-09 ENCOUNTER — Encounter: Payer: Self-pay | Admitting: Family Medicine

## 2018-02-09 ENCOUNTER — Ambulatory Visit (INDEPENDENT_AMBULATORY_CARE_PROVIDER_SITE_OTHER): Payer: Medicare HMO | Admitting: Family Medicine

## 2018-02-09 ENCOUNTER — Other Ambulatory Visit: Payer: Self-pay | Admitting: Family Medicine

## 2018-02-09 DIAGNOSIS — Z23 Encounter for immunization: Secondary | ICD-10-CM

## 2018-02-09 DIAGNOSIS — R079 Chest pain, unspecified: Secondary | ICD-10-CM | POA: Diagnosis not present

## 2018-02-09 DIAGNOSIS — I2699 Other pulmonary embolism without acute cor pulmonale: Secondary | ICD-10-CM

## 2018-02-09 DIAGNOSIS — R531 Weakness: Secondary | ICD-10-CM

## 2018-02-09 MED ORDER — LEFLUNOMIDE 10 MG PO TABS: 10.0000 mg | ORAL_TABLET | Freq: Every day | ORAL | 1 refills | Status: DC

## 2018-02-09 NOTE — Progress Notes (Signed)
Subjective:    Patient ID: Evan [name], male    DOB: [birth year], 84 y.o.   MRN: [id number]  Illness   01/30/18 Patient walked in urgently this afternoon at 4:00 complaining of feeling weak.  Initially a temporal thermometer was used and his temperature was found to be 102.9!.  We repeated this with an oral thermometer and his temperature was found to be 98.8.  Therefore I believe the temperature was incorrect.  I repeated the oral temperature again 20 minutes later and it was 97.8.  Patient's blood pressure initially upon walking and was 98/60.  Therefore we started the patient on normal saline and he was given a 500 cc bolus.  Patient states that he was fine when he went to sleep.  When he awoke from his nap this afternoon, he felt extremely shaky and weak.  His legs felt weak.  His daughter brought him straight to the office.  He states that he has had a cough over the last week that is gradually gotten worse.  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  However he does report increasing cough and increasing weakness over the last week.  He denies any sore throat.  He denies any rhinorrhea.  He denies any otalgia.  He denies any nausea or vomiting.  He denies any chest pain.  He denies any shortness of breath above his baseline.  He denies any dysuria.  He denies any urgency or frequency.  Initial flu test was negative.  At that time, my plan was:  Exam today is unremarkable.  The initial temperature was incorrect.  Repeated oral temperatures have been in a normal range between 97.8 and 98.8.  Therefore I will disregard the initial temporal scan which said 102.9.  Patient's blood pressure has improved to 120/60 after receiving a 500 cc bolus of normal saline.  Flu test is negative.  Given the increasing cough and the crackles on exam I  will treat the patient for possible community-acquired pneumonia.  Patient was given 1 g of Rocephin IM x1 now and I will start him on a Z-Pak.  Patient will return tomorrow for reevaluation to ensure that he is in gradually improving.  CBC and CMP were sent.  Patient is instructed to go to the emergency room if symptoms worsen or change in any way.  02/05/18 Patient return the next day for follow-up.  Given worsening sided chest pain shortness of breath, he was recommended to go to the emergency room.  In the emergency room, CT scan of the chest revealed right for pulmonary embolism.  He was also found to have a left peroneal vein DVT.  He was admitted to the hospital and started on heparin and then transitioned to Lovenox 80 mg subcu twice daily for treatment of the pulmonary embolism.  It was recommended for lifelong anticoagulation given the fact the patient has a history of right-sided lung cancer and also formed DVT and pulmonary embolism while taking Eliquis.  He is here today for follow-up.  His daughter states that he may have missed 1 or 2 doses of Eliquis but he usually took it twice a day.  He was compliant more than 90% of the time.  He is reporting right-sided chest pain.  He is also reporting fatigue and shortness of breath and dyspnea on exertion.  Symptoms have not changed considerably from last week.  He denies any syncope.  He denies any palpitations.  At that time, my plan was: Given his history of lung cancer, and the fact that his DVT and pulmonary embolism formed while taking Eliquis, I recommended that the patient remain on Lovenox indefinitely.  He will discontinue Eliquis and use only Lovenox.  I would recommend 80 mg twice daily for at least the first 6 months.  Then to ensure compliance and for ease of administration, we may consider switching to once daily lovenox but use 1.5 mg/kg daily (120 mg).  I will follow-up lab studies as requested by the hospitalist including a CBC and  CMP.  02/09/18 Patient continues to endorse the same symptoms.  He reports fatigue.  He reports weakness.  He reports that his legs feel shaky.  He reports chest pain that is essentially been present over the last year and is essentially unchanged.  He reports vague shortness of breath.  Physical therapy has not yet coming to his home. Past Medical History:  Diagnosis Date  . Adenocarcinoma of right lung ([place]) 01/06/2016  . Anginal chest pain at rest [facility])    Chronic non, controlled on Lorazepam  . Anxiety   . Arthritis    "all over"   . BPH (benign prostatic hyperplasia)   . CAD (coronary artery disease)   . Chronic lower back pain   . Complication of anesthesia    "problems making water afterwards"  . Depression   . DJD (degenerative joint disease)   . Esophageal ulcer without bleeding    bid ppi indefinitely  . Family history of adverse reaction to anesthesia    "children w/PONV"  . GERD (gastroesophageal reflux disease)   . Headache    "couple/week maybe" (09/04/2015)  . Hemochromatosis    Possible, elevated iron stores, hook-like osteophytes on hand films, normal LFTs  . Hepatitis    "yellow jaundice as a baby"  . History of ASCVD    MULTIVESSEL  . Hyperlipidemia   . Hypertension   . Myocardial infarction ([place]) 1999  . Osteoarthritis   . Pneumonia    "several times; got a little now" (09/04/2015)  . Rheumatoid arthritis ([place])   . Subdural hematoma ([facility])    small after fall 09/2016-plavix held, neurosurgery consulted.  Asymptomatic.     Past Surgical History:  Procedure Laterality Date  . ARM SKIN LESION BIOPSY / EXCISION Left 09/02/2015  . CATARACT EXTRACTION W/ INTRAOCULAR LENS  IMPLANT, BILATERAL Bilateral   . CHOLECYSTECTOMY OPEN    . CORNEAL TRANSPLANT Bilateral    "one at [facility]; one at [facility]"  . CORONARY ANGIOPLASTY WITH STENT PLACEMENT    . CORONARY ARTERY BYPASS GRAFT  1999  . DESCENDING AORTIC ANEURYSM REPAIR W/ STENT     DE stent ostium into the right radial  free graft at [facility]-, 02-2007  . ESOPHAGOGASTRODUODENOSCOPY N/A 11/09/2014   Procedure: ESOPHAGOGASTRODUODENOSCOPY (EGD);  Surgeon: [name], MD;  Location: [facility] ENDOSCOPY;  Service: Endoscopy;  Laterality: N/A;  . INGUINAL HERNIA REPAIR    . JOINT REPLACEMENT    . LEFT HEART CATH AND CORS/GRAFTS ANGIOGRAPHY N/A 06/27/2017   Procedure: LEFT HEART CATH AND CORS/GRAFTS ANGIOGRAPHY;  Surgeon: [name], MD;  Location: [place] [facility];  Service: Cardiovascular;  Laterality: N/A;  . NASAL SINUS SURGERY    . SHOULDER OPEN ROTATOR CUFF REPAIR Right   . TOTAL KNEE ARTHROPLASTY Bilateral    Current Outpatient Medications on File Prior  to Visit  Medication Sig Dispense Refill  . Cholecalciferol 2000 units TABS Take 1 tablet (2,000 Units total) by mouth daily. 30 each 11  . enoxaparin (LOVENOX) 80 MG/0.8ML injection Inject 0.8 mLs (80 mg total) into the skin every 12 (twelve) hours. 30 Syringe 0  . ENSURE (ENSURE) Take 237 mLs by mouth 2 (two) times daily between meals.    Marland Kitchen FLUoxetine (PROZAC) 20 MG capsule TAKE ONE CAPSULE BY MOUTH DAILY (Patient taking differently: Take 20 mg by mouth daily. ) 90 capsule 3  . furosemide (LASIX) 20 MG tablet Take 1 tablet (20 mg total) once a day. Take an extra tablet (20 mg total) for increased swelling, weight gain, or shortness of breath. (Patient taking differently: Take 20 mg by mouth daily. May take an extra tablet (20 mg total) for increased swelling, weight gain, or shortness of breath.) 180 tablet 3  . ipratropium-albuterol (DUONEB) 0.5-2.5 (3) MG/3ML SOLN Take 3 mLs by nebulization every 4 (four) hours as needed (or Chest Pain / Tightness). 360 mL 3  . leflunomide (ARAVA) 10 MG tablet Take 10 mg by mouth daily.    Marland Kitchen Lifitegrast (XIIDRA) 5 % SOLN Place 1-2 drops into both eyes daily.     Marland Kitchen LORazepam (ATIVAN) 1 MG tablet TAKE ONE TABLET BY MOUTH EVERY EIGHT HOURS AS NEEDED FOR ANXIETY (Patient taking differently: Take 1 mg by mouth every 8 (eight) hours as  needed for anxiety. ) 90 tablet 0  . magnesium oxide (MAG-OX) 400 (241.3 Mg) MG tablet Take 1 tablet (400 mg total) by mouth 2 (two) times daily. 60 tablet 0  . metoprolol succinate (TOPROL-XL) 25 MG 24 hr tablet TAKE ONE (1) TABLET BY MOUTH EVERY DAY (Patient taking differently: Take 25 mg by mouth daily. ) 30 tablet 10  . nitroGLYCERIN (NITROSTAT) 0.4 MG SL tablet Place 1 tablet (0.4 mg total) under the tongue every 5 (five) minutes as needed for chest pain. 25 tablet 2  . pantoprazole (PROTONIX) 40 MG tablet Take one (1) tablet (40 mg) by mouth daily. (Patient taking differently: Take 40 mg by mouth daily. ) 90 tablet 3  . rosuvastatin (CRESTOR) 5 MG tablet TAKE 5 mg  EVERY DAY (Patient taking differently: Take 5 mg by mouth daily. ) 90 tablet 3  . tamsulosin (FLOMAX) 0.4 MG CAPS capsule TAKE 1 CAPSULE EVERY DAY (Patient taking differently: Take 0.4 mg by mouth daily. ) 90 capsule 3   No current facility-administered medications on file prior to visit.    Allergies  Allergen Reactions  . Feldene [Piroxicam] Other (See Comments)    REACTION: Blisters  . Statins Other (See Comments)    Myalgias  . Doxycycline Other (See Comments)    REACTION:  unknown  . Latex Rash  . Tequin Anxiety  . Valium Other (See Comments)    REACTION:  unknown   Social History   Socioeconomic History  . Marital status: Widowed    Spouse name: Not on file  . Number of children: Not on file  . Years of education: Not on file  . Highest education level: Not on file  Occupational History  . Not on file  Social Needs  . Financial resource strain: Not on file  . Food insecurity:    Worry: Not on file    Inability: Not on file  . Transportation needs:    Medical: Not on file    Non-medical: Not on file  Tobacco Use  . Smoking status: Never Smoker  .  Smokeless tobacco: Former Systems developer    Types: Chew  . Tobacco comment: "quit chewing in the 1960s"  Substance and Sexual Activity  . Alcohol use: No  . Drug  use: No  . Sexual activity: Never  Lifestyle  . Physical activity:    Days per week: Not on file    Minutes per session: Not on file  . Stress: Not on file  Relationships  . Social connections:    Talks on phone: Not on file    Gets together: Not on file    Attends religious service: Not on file    Active member of club or organization: Not on file    Attends meetings of clubs or organizations: Not on file    Relationship status: Not on file  . Intimate partner violence:    Fear of current or ex partner: Not on file    Emotionally abused: Not on file    Physically abused: Not on file    Forced sexual activity: Not on file  Other Topics Concern  . Not on file  Social History Narrative   01-05-18 Unable to ask abuse questions family with him today.      Review of Systems  All other systems reviewed and are negative.      Objective:   Physical Exam  Constitutional: He appears well-developed and well-nourished. No distress.  HENT:  Right Ear: External ear normal.  Left Ear: External ear normal.  Nose: Nose normal.  Mouth/Throat: Oropharynx is clear and moist. No oropharyngeal exudate.  Eyes: Conjunctivae are normal.  Neck: Neck supple. No JVD present.  Cardiovascular: Normal rate, regular rhythm and normal heart sounds.  No murmur heard. Pulmonary/Chest: Effort normal. No respiratory distress. He has no wheezes. He has rales.     He exhibits no tenderness.  Abdominal: Soft. Bowel sounds are normal. He exhibits no distension. There is no tenderness. There is no rebound and no guarding.  Musculoskeletal: He exhibits no edema.  Lymphadenopathy:    He has no cervical adenopathy.  Skin: He is not diaphoretic.  Vitals reviewed.         Assessment & Plan:  Hypomagnesemia - Plan: Magnesium, COMPLETE METABOLIC PANEL WITH GFR  Pulmonary embolism and infarction (HCC)  Right-sided chest pain  Weakness  The majority of the patient's symptoms today are chronic  issues he is complained of over the last few years including shortness of breath weakness, feeling shaky.  I believe that these chronic symptoms have recently been exacerbated by his DVT and pulmonary embolism.  I would recheck the patient in 3 months.  Continue Lovenox twice daily for 3 months and then switch to 1.5 mg/kg daily in 3 months.  Monitor MP today to check his renal function along with his magnesium and potassium.  Arrange for home health physical therapy

## 2018-02-10 LAB — COMPLETE METABOLIC PANEL WITH GFR
AG Ratio: 1.8 (calc) (ref 1.0–2.5)
ALT: 15 U/L (ref 9–46)
AST: 21 U/L (ref 10–35)
Albumin: 4 g/dL (ref 3.6–5.1)
Alkaline phosphatase (APISO): 103 U/L (ref 40–115)
BUN/Creatinine Ratio: 22 (calc) (ref 6–22)
BUN: 27 mg/dL — ABNORMAL HIGH (ref 7–25)
CO2: 22 mmol/L (ref 20–32)
Calcium: 9 mg/dL (ref 8.6–10.3)
Chloride: 102 mmol/L (ref 98–110)
Creat: 1.23 mg/dL — ABNORMAL HIGH (ref 0.70–1.11)
GFR, Est African American: 60 mL/min/{1.73_m2} (ref >=60)
GFR, Est Non African American: 52 mL/min/{1.73_m2} — ABNORMAL LOW (ref >=60)
Globulin: 2.2 g/dL (calc) (ref 1.9–3.7)
Glucose, Bld: 103 mg/dL — ABNORMAL HIGH (ref 65–99)
Potassium: 4.3 mmol/L (ref 3.5–5.3)
Sodium: 137 mmol/L (ref 135–146)
Total Bilirubin: 0.6 mg/dL (ref 0.2–1.2)
Total Protein: 6.2 g/dL (ref 6.1–8.1)

## 2018-02-10 LAB — MAGNESIUM: Magnesium: 2.1 mg/dL (ref 1.5–2.5)

## 2018-02-13 ENCOUNTER — Other Ambulatory Visit: Payer: Self-pay

## 2018-02-13 ENCOUNTER — Encounter: Payer: Self-pay | Admitting: Urology

## 2018-02-13 ENCOUNTER — Ambulatory Visit
Admission: RE | Admit: 2018-02-13 | Discharge: 2018-02-13 | Disposition: A | Discharge status: Home / Self Care | Payer: Medicare HMO | Source: Ambulatory Visit | Attending: Urology | Admitting: Urology

## 2018-02-13 VITALS — BP 128/74 | HR 67 | Temp 97.7°F | Resp 20 | Ht 68.0 in | Wt 180.4 lb

## 2018-02-13 DIAGNOSIS — Z79899 Other long term (current) drug therapy: Secondary | ICD-10-CM | POA: Insufficient documentation

## 2018-02-13 DIAGNOSIS — C3431 Malignant neoplasm of lower lobe, right bronchus or lung: Secondary | ICD-10-CM | POA: Diagnosis not present

## 2018-02-13 DIAGNOSIS — I2699 Other pulmonary embolism without acute cor pulmonale: Secondary | ICD-10-CM | POA: Diagnosis not present

## 2018-02-13 DIAGNOSIS — Z96653 Presence of artificial knee joint, bilateral: Secondary | ICD-10-CM | POA: Diagnosis not present

## 2018-02-13 DIAGNOSIS — I82492 Acute embolism and thrombosis of other specified deep vein of left lower extremity: Secondary | ICD-10-CM | POA: Diagnosis not present

## 2018-02-13 DIAGNOSIS — Z08 Encounter for follow-up examination after completed treatment for malignant neoplasm: Secondary | ICD-10-CM | POA: Diagnosis not present

## 2018-02-13 DIAGNOSIS — Z7901 Long term (current) use of anticoagulants: Secondary | ICD-10-CM | POA: Insufficient documentation

## 2018-02-13 DIAGNOSIS — I82452 Acute embolism and thrombosis of left peroneal vein: Secondary | ICD-10-CM | POA: Insufficient documentation

## 2018-02-13 DIAGNOSIS — I25118 Atherosclerotic heart disease of native coronary artery with other forms of angina pectoris: Secondary | ICD-10-CM | POA: Diagnosis not present

## 2018-02-13 DIAGNOSIS — R590 Localized enlarged lymph nodes: Secondary | ICD-10-CM | POA: Diagnosis not present

## 2018-02-13 DIAGNOSIS — Z888 Allergy status to other drugs, medicaments and biological substances status: Secondary | ICD-10-CM | POA: Diagnosis not present

## 2018-02-13 DIAGNOSIS — M069 Rheumatoid arthritis, unspecified: Secondary | ICD-10-CM | POA: Diagnosis not present

## 2018-02-13 DIAGNOSIS — Z881 Allergy status to other antibiotic agents status: Secondary | ICD-10-CM | POA: Insufficient documentation

## 2018-02-13 DIAGNOSIS — C3491 Malignant neoplasm of unspecified part of right bronchus or lung: Secondary | ICD-10-CM

## 2018-02-13 DIAGNOSIS — Z923 Personal history of irradiation: Secondary | ICD-10-CM | POA: Diagnosis not present

## 2018-02-13 DIAGNOSIS — I10 Essential (primary) hypertension: Secondary | ICD-10-CM | POA: Diagnosis not present

## 2018-02-13 DIAGNOSIS — M150 Primary generalized (osteo)arthritis: Secondary | ICD-10-CM | POA: Diagnosis not present

## 2018-02-13 DIAGNOSIS — I82409 Acute embolism and thrombosis of unspecified deep veins of unspecified lower extremity: Secondary | ICD-10-CM | POA: Diagnosis not present

## 2018-02-13 DIAGNOSIS — Z85118 Personal history of other malignant neoplasm of bronchus and lung: Secondary | ICD-10-CM | POA: Diagnosis not present

## 2018-02-13 NOTE — Progress Notes (Signed)
Radiation Oncology         [phone] ________________________________  Name: Evan [name] MRN: [id number]  Date: 02/13/2018  DOB: [birth year]  Post Treatment Note  CC: [name], MD  [name], *  Diagnosis:   84 yo man with clinical stage I Adenocarcinoma of the right lower lung     Interval Since Last Radiation: 2 years 02/15/2016 to 02/22/2016:  The RLL target was treated to 50 Gy in 5 fractions of 10 Gy  Narrative:  The patient returns today for routine follow-up and to discuss results from his recent follow up Chest CT.    In summary, he was initially seen at the request of Dr. Julien Nordmann on 01/25/16 for a new diagnosis of early stage lung cancer. The patient had been seen by Dr. Vaughan Browner in pulmonology on 12/03/15 complaining of chest pain and worsening shortness of breath. A CT scan of the chest on 12/15/2015 revealed interval enlargement of 2.7 x 1.6 x 2.0 cm right lower lobe mass highly suspicious for primary bronchogenic carcinoma. There was also borderline enlargement 0.9 cm high right paratracheal lymph node that was nonspecific. A PET scan was performed on 12/22/2015 and it showed predominantly solid 2.9 x 2.6 cm posterior right lower lobe pulmonary nodule with peripheral groundglass density mildly hypermetabolic with maximum SUV of 3.9 which increased from 1.3 x 1.1 cm. There was hypermetabolic bilateral hilar and mediastinal adenopathy highly nonspecific given the suspected underlying interstitial lung disease and could be reactive or metastatic. There was also nonspecific hypermetabolic right level II lymph nodes favoring a reactive node. On 12/24/2015 the patient underwent CT-guided core biopsy of the right lower lobe lung nodule by interventional radiology. The final pathology revealed adenocarcinoma.  MRI of the brain performed on 01/15/2016 showed no evidence for metastatic disease to the brain.  He elected to proceed with SBRT to the RLL mass for definitive  treatment and received 5 fractions to a total dose of 50Gy between 02/15/2016 to 02/22/2016.  He tolerated treatment well and had no significant adverse effects. His initial post-treatment CT Chest demonstrated evolving postradiation changes as well as an interval increase in size of the treated RLL nodule and hilar nodes but were felt likely related to inflammatory changes associated with recent SBRT.  His subsequent follow up Chest CTs for disease surveillance have not shown evidence for disease progression.  He continues in follow up with Dr. Lake Bells in pulmonology for underlying COPD/emphysema.  He had a recent follow-up chest CT on 08/22/17 which shows interval progression of the confluent opacity in the region of right lower lobe, presumably radiation fibrosis and stable scattered mediastinal lymph nodes.    Interval History: He had a follow up CT Chest on 01/04/18 showing stable treatment related changes in the right lung base without specific findings to suggest residual or recurrent disease.  The previous index right paratracheal node was decreased in size from 36mm to 94mm.  Unfortunately, recently, he began feeling weak with increased SOB and chest pain requiring emergent evaluation in the ER on 01/31/18 and again 02/02/18.  He was admitted overnight for observation on 02/02/18 after CT Angio Chest revealed an acute PE in the lower lobe branch of right pulmonary artery and DVT in left peroneal vein despite being on chronic Eliquis.  He was treated with 24hrs of IV heparin and discharged home on SQ Lovenox injections which he has continued.  On this CT Angio Chest, there was also noted an interval increase in size  of the right paratracheal lymph node, measuring 58mm as compared to 60mm on previous CT Chest 01/04/18.  Other mediastinal nodes remained unchanged. He presents today with family to review these findings.                       On review of systems, the patient states that he is doing well  overall. He continues with chronic shortness of breath which is exacerbated with exertion and lying flat as well as a chronic productive cough with whitish sputum.  He denies hemoptysis, chest pain, fever, chills or night sweats. He reports a good appetite and has been maintaining his weight.  He denies headaches, changes in visual or auditory acuity, difficulty with speech, imbalance, tremor or seizure activity.  ALLERGIES:  is allergic to feldene [piroxicam]; statins; doxycycline; latex; tequin; and valium.  Meds: Current Outpatient Medications  Medication Sig Dispense Refill  . Cholecalciferol 2000 units TABS Take 1 tablet (2,000 Units total) by mouth daily. 30 each 11  . enoxaparin (LOVENOX) 80 MG/0.8ML injection Inject 0.8 mLs (80 mg total) into the skin every 12 (twelve) hours. 30 Syringe 0  . ENSURE (ENSURE) Take 237 mLs by mouth 2 (two) times daily between meals.    Marland Kitchen FLUoxetine (PROZAC) 20 MG capsule TAKE ONE CAPSULE BY MOUTH DAILY (Patient taking differently: Take 20 mg by mouth daily. ) 90 capsule 3  . furosemide (LASIX) 20 MG tablet Take 1 tablet (20 mg total) once a day. Take an extra tablet (20 mg total) for increased swelling, weight gain, or shortness of breath. (Patient taking differently: Take 20 mg by mouth daily. May take an extra tablet (20 mg total) for increased swelling, weight gain, or shortness of breath.) 180 tablet 3  . ipratropium-albuterol (DUONEB) 0.5-2.5 (3) MG/3ML SOLN Take 3 mLs by nebulization every 4 (four) hours as needed (or Chest Pain / Tightness). 360 mL 3  . leflunomide (ARAVA) 10 MG tablet Take 1 tablet (10 mg total) by mouth daily. 30 tablet 1  . Lifitegrast (XIIDRA) 5 % SOLN Place 1-2 drops into both eyes daily.     Marland Kitchen LORazepam (ATIVAN) 1 MG tablet TAKE ONE TABLET BY MOUTH EVERY EIGHT HOURS AS NEEDED FOR ANXIETY (Patient taking differently: Take 1 mg by mouth every 8 (eight) hours as needed for anxiety. ) 90 tablet 0  . magnesium oxide (MAG-OX) 400 (241.3  Mg) MG tablet Take 1 tablet (400 mg total) by mouth 2 (two) times daily. 60 tablet 0  . metoprolol succinate (TOPROL-XL) 25 MG 24 hr tablet TAKE ONE (1) TABLET BY MOUTH EVERY DAY (Patient taking differently: Take 25 mg by mouth daily. ) 30 tablet 10  . nitroGLYCERIN (NITROSTAT) 0.4 MG SL tablet Place 1 tablet (0.4 mg total) under the tongue every 5 (five) minutes as needed for chest pain. 25 tablet 2  . pantoprazole (PROTONIX) 40 MG tablet Take one (1) tablet (40 mg) by mouth daily. (Patient taking differently: Take 40 mg by mouth daily. ) 90 tablet 3  . rosuvastatin (CRESTOR) 5 MG tablet TAKE 5 mg  EVERY DAY (Patient taking differently: Take 5 mg by mouth daily. ) 90 tablet 3  . tamsulosin (FLOMAX) 0.4 MG CAPS capsule TAKE 1 CAPSULE EVERY DAY (Patient taking differently: Take 0.4 mg by mouth daily. ) 90 capsule 3   No current facility-administered medications for this encounter.     Physical Findings:  height is 5\' 8"  (1.727 m) and weight is 180 lb 6.4 oz (  81.8 kg). His temperature is 97.7 F (36.5 C). His blood pressure is 128/74 and his pulse is 67. His respiration is 20 and oxygen saturation is 99%.  Pain Assessment Pain Score: 0-No pain/10 In general this is a well appearing Caucasian male in no acute distress. He's alert and oriented x4 and appropriate throughout the examination. Cardiopulmonary assessment is negative for acute distress and he exhibits normal effort.   Lab Findings: Lab Results  Component Value Date   WBC 4.9 02/05/2018   HGB 11.5 (L) 02/05/2018   HCT 34.0 (L) 02/05/2018   MCV 92.6 02/05/2018   PLT 111 (L) 02/05/2018     Radiographic Findings: Dg Chest 2 View  Result Date: 02/02/2018 CLINICAL DATA:  Initial evaluation for acute chest pain, shortness of breath. EXAM: CHEST - 2 VIEW COMPARISON:  Prior radiograph from 01/31/2018. FINDINGS: Median sternotomy wires noted. Underlying cardiomegaly, stable. Mediastinal silhouette normal. Aortic atherosclerosis. Lungs  mildly hypoinflated. Streaky bibasilar linear opacities favored to reflect atelectatic changes. No consolidative airspace opacity. Veiling opacities overlying the bilateral lung bases likely reflect small layering pleural effusions, left greater than right. Perihilar vascular congestion without overt pulmonary edema. No pneumothorax. No acute osseus abnormality. IMPRESSION: 1. Cardiomegaly with mild perihilar vascular congestion and small layering bilateral pleural effusions, left greater than right. 2. Superimposed bibasilar atelectatic changes. 3. Aortic atherosclerosis. Electronically Signed   By: Jeannine Boga M.D.   On: 02/02/2018 06:31   Dg Chest 2 View  Result Date: 01/31/2018 CLINICAL DATA:  Productive cough for the past 2-3 weeks. History of lung cancer. EXAM: CHEST - 2 VIEW COMPARISON:  CT chest dated January 04, 2018. Chest x-ray dated December 02, 2017. FINDINGS: Stable mild cardiomegaly status post CABG. Coarsened interstitial markings are similar to prior studies. Bibasilar atelectasis/scarring is unchanged. No focal consolidation, pleural effusion, or pneumothorax. No acute osseous abnormality. IMPRESSION: No active cardiopulmonary disease. Electronically Signed   By: Titus Dubin M.D.   On: 01/31/2018 11:46   Ct Angio Chest Pe W Or Wo Contrast  Result Date: 02/02/2018 CLINICAL DATA:  Chest pain, shortness of breath. History of right lung cancer. EXAM: CT ANGIOGRAPHY CHEST WITH CONTRAST TECHNIQUE: Multidetector CT imaging of the chest was performed using the standard protocol during bolus administration of intravenous contrast. Multiplanar CT image reconstructions and MIPs were obtained to evaluate the vascular anatomy. CONTRAST:  145mL ISOVUE-370 IOPAMIDOL (ISOVUE-370) INJECTION 76% COMPARISON:  Radiographs of same day.  CT scan of January 04, 2018. FINDINGS: Cardiovascular: Atherosclerosis of thoracic aorta is noted without aneurysm formation. Filling defect is seen in peripheral  lower lobe branch of right pulmonary artery concerning for pulmonary embolus. Mild cardiomegaly is noted no pericardial effusion is noted. Status post coronary artery bypass graft. Mediastinum/Nodes: Thyroid gland and esophagus are unremarkable. 15 mm right paratracheal lymph node is noted which is enlarged compared to prior exam. Stable small right hilar lymph node is noted. Stable 1 cm subcarinal lymph node is noted. Lungs/Pleura: Minimal bilateral pleural effusions are noted. No pneumothorax is noted. Stable right posterior basilar opacity is noted which may represent atelectasis, infiltrate or possibly radiation fibrosis. Stable right upper lobe scarring is noted. Upper Abdomen: No acute abnormality. Musculoskeletal: No chest wall abnormality. No acute or significant osseous findings. Review of the MIP images confirms the above findings. IMPRESSION: Acute embolus is noted in peripheral lower lobe branch of right pulmonary artery. Critical Value/emergent results were called by telephone at the time of interpretation on 02/02/2018 at 10:59 am to Dr. Toy Baker ,  who verbally acknowledged these results. Right paratracheal lymph node measures 15 mm which is enlarged compared to prior exam; metastatic disease cannot be excluded. Other mediastinal lymph nodes are unchanged. Minimal bilateral pleural effusions are noted. Stable right posterior basilar opacity is noted which may represent atelectasis, infiltrate or possibly radiation fibrosis. Aortic Atherosclerosis (ICD10-I70.0). Electronically Signed   By: Marijo Conception, M.D.   On: 02/02/2018 10:59   Vas [place] Lower Extremity Venous (dvt)  Result Date: 02/04/2018  Lower Venous Study Risk Factors: Confirmed PE. Comparison Study: No prior study on file Performing Technologist: Sharion Dove RVS  Examination Guidelines: A complete evaluation includes B-mode imaging, spectral Doppler, color Doppler, and power Doppler as needed of all accessible portions of each  vessel. Bilateral testing is considered an integral part of a complete examination. Limited examinations for reoccurring indications may be performed as noted.  Right Venous Findings: +---------+---------------+---------+-----------+----------+-------+          CompressibilityPhasicitySpontaneityPropertiesSummary +---------+---------------+---------+-----------+----------+-------+ CFV      Full           Yes      Yes                          +---------+---------------+---------+-----------+----------+-------+ SFJ      Full                                                 +---------+---------------+---------+-----------+----------+-------+ FV Prox  Full                                                 +---------+---------------+---------+-----------+----------+-------+ FV Mid   Full                                                 +---------+---------------+---------+-----------+----------+-------+ FV DistalFull                                                 +---------+---------------+---------+-----------+----------+-------+ PFV      Full                                                 +---------+---------------+---------+-----------+----------+-------+ POP      Full           Yes      Yes                          +---------+---------------+---------+-----------+----------+-------+ PTV      Full                                                 +---------+---------------+---------+-----------+----------+-------+ PERO     Full                                                 +---------+---------------+---------+-----------+----------+-------+  GSV      Full                                                 +---------+---------------+---------+-----------+----------+-------+  Left Venous Findings: +---------+---------------+---------+-----------+----------+-------+          CompressibilityPhasicitySpontaneityPropertiesSummary  +---------+---------------+---------+-----------+----------+-------+ CFV      Full                                                 +---------+---------------+---------+-----------+----------+-------+ SFJ      Full                                                 +---------+---------------+---------+-----------+----------+-------+ FV Prox  Full                                                 +---------+---------------+---------+-----------+----------+-------+ FV Mid   Full                                                 +---------+---------------+---------+-----------+----------+-------+ FV DistalFull                                                 +---------+---------------+---------+-----------+----------+-------+ PFV      Full                                                 +---------+---------------+---------+-----------+----------+-------+ POP      Full                                                 +---------+---------------+---------+-----------+----------+-------+ PTV      Full                                                 +---------+---------------+---------+-----------+----------+-------+ PERO     None                                                 +---------+---------------+---------+-----------+----------+-------+    Summary: Right: There is no evidence of deep vein thrombosis in the lower extremity. Left: Findings consistent with acute deep vein thrombosis involving the left peroneal vein.  *See table(s) above  for measurements and observations. Electronically signed by Curt Jews MD on 02/04/2018 at 8:49:55 AM.    Final     Impression/Plan: 47. 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 We reviewed his most recent chest CT results which revealed overall disease stability with no new concerning nodules or definite evidence of disease progression. There is interval progression of the right paratracheal node  which is favored to be reactive given his underlying interstitial lung disease and recent diagnosis of PE.  This node was decreased in size on CT Chest from 01/04/18, prior to PE episode.  We will continue to monitor him closely with a repeat chest CT in approximately 4 months. He will follow up in the office thereafter to review results and any further recommendations. He will continue to follow-up with Dr. Lake Bells in pulmonology for his underlying interstitial lung disease contributing to his chronic shortness of breath and cough. He has a scheduled f/u with Dr. Lake Bells 03/05/18.  We will see him back in the office after his next imaging to review those results and recommendations. He knows to call with any questions or concerns in the interim.     2. Acute DVT and PE.  Currently treated with Lovenox SQ injections BID and managed by his PCP, [name]. Anticipate cntinuing Lovenox anticoagulation indefinitely. He has a scheduled follow up with [name] on 05/11/18.    Nicholos Johns, PA-C

## 2018-02-13 NOTE — Progress Notes (Addendum)
Evan [name]. Slovacek 84 yo man with clinical stage I Adenocarcinoma of the right lower lungradiation completed 02-22-16, review 02-01-18 CT chest w contrast, 4 month FU    Weight changes, if any: Wt Readings from Last 3 Encounters:  02/13/18 180 lb 6.4 oz (81.8 kg)  02/09/18 177 lb (80.3 kg)  02/05/18 181 lb (82.1 kg)   Respiratory complaints, if any:SOB with exertion and in bed,coughing cloudy secretions,has acute embolus is noted in peripheral lower lobe branch of right pulmonary artery. Swallowing Problems/Pain/Difficulty swallowing:No problems Appetite :Good Pain:No c/o chest tightness took NTG last week in a half since discharge from the hospital with some relief from chest tightness.  Was admitted 02-02-18 Bilateral pleural effusion , chest pain bronchitis after ED visit 01-31-18 [facility]. BP 128/74 (BP Location: Right Arm, Patient Position: Sitting)   Pulse 67   Temp 97.7 F (36.5 C)   Resp 20   Ht 5\' 8"  (1.727 m)   Wt 180 lb 6.4 oz (81.8 kg)   SpO2 99%   BMI 27.43 kg/m

## 2018-02-15 DIAGNOSIS — Z85118 Personal history of other malignant neoplasm of bronchus and lung: Secondary | ICD-10-CM | POA: Diagnosis not present

## 2018-02-15 DIAGNOSIS — Z923 Personal history of irradiation: Secondary | ICD-10-CM | POA: Diagnosis not present

## 2018-02-15 DIAGNOSIS — I25118 Atherosclerotic heart disease of native coronary artery with other forms of angina pectoris: Secondary | ICD-10-CM | POA: Diagnosis not present

## 2018-02-15 DIAGNOSIS — I10 Essential (primary) hypertension: Secondary | ICD-10-CM | POA: Diagnosis not present

## 2018-02-15 DIAGNOSIS — Z96653 Presence of artificial knee joint, bilateral: Secondary | ICD-10-CM | POA: Diagnosis not present

## 2018-02-15 DIAGNOSIS — I82492 Acute embolism and thrombosis of other specified deep vein of left lower extremity: Secondary | ICD-10-CM | POA: Diagnosis not present

## 2018-02-15 DIAGNOSIS — M150 Primary generalized (osteo)arthritis: Secondary | ICD-10-CM | POA: Diagnosis not present

## 2018-02-15 DIAGNOSIS — M069 Rheumatoid arthritis, unspecified: Secondary | ICD-10-CM | POA: Diagnosis not present

## 2018-02-15 DIAGNOSIS — I2699 Other pulmonary embolism without acute cor pulmonale: Secondary | ICD-10-CM | POA: Diagnosis not present

## 2018-02-17 ENCOUNTER — Telehealth: Payer: Self-pay | Admitting: *Deleted

## 2018-02-17 NOTE — Telephone Encounter (Addendum)
Received fax requesting clarification on enoxaparin (LOVENOX) 80 MG/0.8ML injection.  Reports that insurance max dose allowed per day is 1.04. Current prescription is for 1.6 QD.   MD please advise.

## 2018-02-19 ENCOUNTER — Ambulatory Visit: Payer: Medicare HMO | Admitting: Physician Assistant

## 2018-02-19 ENCOUNTER — Encounter: Payer: Self-pay | Admitting: Physician Assistant

## 2018-02-19 VITALS — BP 122/70 | HR 78 | Ht 68.0 in | Wt 178.0 lb

## 2018-02-19 DIAGNOSIS — I5032 Chronic diastolic (congestive) heart failure: Secondary | ICD-10-CM

## 2018-02-19 DIAGNOSIS — I480 Paroxysmal atrial fibrillation: Secondary | ICD-10-CM | POA: Diagnosis not present

## 2018-02-19 DIAGNOSIS — I2699 Other pulmonary embolism without acute cor pulmonale: Secondary | ICD-10-CM

## 2018-02-19 DIAGNOSIS — I2510 Atherosclerotic heart disease of native coronary artery without angina pectoris: Secondary | ICD-10-CM | POA: Diagnosis not present

## 2018-02-19 MED ORDER — ENOXAPARIN SODIUM 80 MG/0.8ML ~~LOC~~ SOLN: 80.0000 mg | Freq: Two times a day (BID) | SUBCUTANEOUS | 0 refills | Status: DC

## 2018-02-19 NOTE — Telephone Encounter (Signed)
Call placed to patient insurance.   Clinical pharmacist reviewed and approved prior authorization.   PA approved 02/19/2018- 04/03/2020.  Confirmation: [id number]  Pharmacy made aware. Prescription covered with $28 co-pay.

## 2018-02-19 NOTE — Telephone Encounter (Signed)
Treatment for PE is 1 mg/kg sq bid.  He is 80 kg.  Therefore his dose is 80 mg bid.  This as prescribed at hospital as well.  We need to argue this.

## 2018-02-19 NOTE — Progress Notes (Signed)
Cardiology Office Note    Date:  02/19/2018   ID:  Evan [name], DOB Feb 09, 1928, MRN [id number]  PCP:  [name], MD  Cardiologist:  Dr. Irish Lack  Chief Complaint: Hospital follow up   History of Present Illness:   Evan [name] is a 84 y.o. male with a history of CAD s/p CABG x2 in 1998 with patent stents and grafts on cardiac catheterization in 06/2017, atrial fibrillation/flutter on Eliquis, chronic diastolic heart failure, Adenocarcinoma of the right lower lungand lung cancer presents for hospital follow up.   Admitted 02/2018 for chest pain. Found to have acute DVT and PE while missed doses of anticoagulation on Eliquis prior to admission. Echo yesterday shows LVEF 60-65% without regional wall motion abnormalities, there is moderate RAE, but normal RV size and function, not suggestive of RH strain - the PE was noted to be peripheral.  Here today for follow up. His chest pain and dyspnea is improving. Patient follows with radiation oncology for his lung cancer.  Concern for disease progression. Plan to continue Lovenox indefinitely and follow up with [facility]. No orthopnea, PND, syncope, LE, palpitation or melena. Patient and 2 daughter's said "only missed Eliquis for one dose".   Past Medical History:  Diagnosis Date  . Adenocarcinoma of right lung ([place]) 01/06/2016  . Anginal chest pain at rest [facility])    Chronic non, controlled on Lorazepam  . Anxiety   . Arthritis    "all over"   . BPH (benign prostatic hyperplasia)   . CAD (coronary artery disease)   . Chronic lower back pain   . Complication of anesthesia    "problems making water afterwards"  . Depression   . DJD (degenerative joint disease)   . Esophageal ulcer without bleeding    bid ppi indefinitely  . Family history of adverse reaction to anesthesia    "children w/PONV"  . GERD (gastroesophageal reflux disease)   . Headache    "couple/week maybe" (09/04/2015)  . Hemochromatosis    Possible, elevated iron  stores, hook-like osteophytes on hand films, normal LFTs  . Hepatitis    "yellow jaundice as a baby"  . History of ASCVD    MULTIVESSEL  . Hyperlipidemia   . Hypertension   . Myocardial infarction ([place]) 1999  . Osteoarthritis   . Pneumonia    "several times; got a little now" (09/04/2015)  . Rheumatoid arthritis ([place])   . Subdural hematoma ([facility])    small after fall 09/2016-plavix held, neurosurgery consulted.  Asymptomatic.      Past Surgical History:  Procedure Laterality Date  . ARM SKIN LESION BIOPSY / EXCISION Left 09/02/2015  . CATARACT EXTRACTION W/ INTRAOCULAR LENS  IMPLANT, BILATERAL Bilateral   . CHOLECYSTECTOMY OPEN    . CORNEAL TRANSPLANT Bilateral    "[facility] at [facility]; one at [facility]"  . CORONARY ANGIOPLASTY WITH STENT PLACEMENT    . CORONARY ARTERY BYPASS GRAFT  1999  . DESCENDING AORTIC ANEURYSM REPAIR W/ STENT     DE stent ostium into the right radial free graft at OM-, 02-2007  . ESOPHAGOGASTRODUODENOSCOPY N/A 11/09/2014   Procedure: ESOPHAGOGASTRODUODENOSCOPY (EGD);  Surgeon: [name], MD;  Location: [facility] ENDOSCOPY;  Service: Endoscopy;  Laterality: N/A;  . INGUINAL HERNIA REPAIR    . JOINT REPLACEMENT    . LEFT HEART CATH AND CORS/GRAFTS ANGIOGRAPHY N/A 06/27/2017   Procedure: LEFT HEART CATH AND CORS/GRAFTS ANGIOGRAPHY;  Surgeon: [name], MD;  Location: [name] CV LAB;  Service: Cardiovascular;  Laterality: N/A;  . NASAL SINUS SURGERY    . SHOULDER OPEN ROTATOR CUFF REPAIR Right   . TOTAL KNEE ARTHROPLASTY Bilateral     Current Medications: Prior to Admission medications   Medication Sig Start Date End Date Taking? Authorizing Provider  Cholecalciferol 2000 units TABS Take 1 tablet (2,000 Units total) by mouth daily. 02/04/16   [name], MD  enoxaparin (LOVENOX) 80 MG/0.8ML injection Inject 0.8 mLs (80 mg total) into the skin every 12 (twelve) hours. 02/03/18   Lady Deutscher, MD  ENSURE (ENSURE) Take 237 mLs by mouth 2 (two) times daily between  meals.    [provider]  FLUoxetine (PROZAC) 20 MG capsule TAKE ONE CAPSULE BY MOUTH DAILY Patient taking differently: Take 20 mg by mouth daily.  02/01/18   [name], MD  furosemide (LASIX) 20 MG tablet Take 1 tablet (20 mg total) once a day. Take an extra tablet (20 mg total) for increased swelling, weight gain, or shortness of breath. Patient taking differently: Take 20 mg by mouth daily. May take an extra tablet (20 mg total) for increased swelling, weight gain, or shortness of breath. 01/16/18   Jettie Booze, MD  ipratropium-albuterol (DUONEB) 0.5-2.5 (3) MG/3ML SOLN Take 3 mLs by nebulization every 4 (four) hours as needed (or Chest Pain / Tightness). 12/23/16   Erlene Quan, PA-C  leflunomide (ARAVA) 10 MG tablet Take 1 tablet (10 mg total) by mouth daily. 02/09/18   [name], MD  Lifitegrast Shirley Friar) 5 % SOLN Place 1-2 drops into both eyes daily.     [provider]  LORazepam (ATIVAN) 1 MG tablet TAKE ONE TABLET BY MOUTH EVERY EIGHT HOURS AS NEEDED FOR ANXIETY Patient taking differently: Take 1 mg by mouth every 8 (eight) hours as needed for anxiety.  02/01/18   [name], MD  magnesium oxide (MAG-OX) 400 (241.3 Mg) MG tablet Take 1 tablet (400 mg total) by mouth 2 (two) times daily. 02/03/18   Lady Deutscher, MD  metoprolol succinate (TOPROL-XL) 25 MG 24 hr tablet TAKE ONE (1) TABLET BY MOUTH EVERY DAY Patient taking differently: Take 25 mg by mouth daily.  09/11/17   Jettie Booze, MD  nitroGLYCERIN (NITROSTAT) 0.4 MG SL tablet Place 1 tablet (0.4 mg total) under the tongue every 5 (five) minutes as needed for chest pain. 12/06/17   Daune Perch, NP  pantoprazole (PROTONIX) 40 MG tablet Take one (1) tablet (40 mg) by mouth daily. Patient taking differently: Take 40 mg by mouth daily.  02/21/17   Jettie Booze, MD  rosuvastatin (CRESTOR) 5 MG tablet TAKE 5 mg  EVERY DAY Patient taking differently: Take 5 mg by mouth  daily.  07/07/17   Jettie Booze, MD  tamsulosin (FLOMAX) 0.4 MG CAPS capsule TAKE 1 CAPSULE EVERY DAY Patient taking differently: Take 0.4 mg by mouth daily.  10/18/17   [name], MD    Allergies:   Feldene [piroxicam]; Statins; Doxycycline; Latex; Tequin; and Valium   Social History   Socioeconomic History  . Marital status: Widowed    Spouse name: Not on file  . Number of children: Not on file  . Years of education: Not on file  . Highest education level: Not on file  Occupational History  . Not on file  Social Needs  . Financial resource strain: Not on file  . Food insecurity:    Worry: Not on file    Inability: Not on file  .  Transportation needs:    Medical: Not on file    Non-medical: Not on file  Tobacco Use  . Smoking status: Never Smoker  . Smokeless tobacco: Former Systems developer    Types: Chew  . Tobacco comment: "quit chewing in the 1960s"  Substance and Sexual Activity  . Alcohol use: No  . Drug use: No  . Sexual activity: Never  Lifestyle  . Physical activity:    Days per week: Not on file    Minutes per session: Not on file  . Stress: Not on file  Relationships  . Social connections:    Talks on phone: Not on file    Gets together: Not on file    Attends religious service: Not on file    Active member of club or organization: Not on file    Attends meetings of clubs or organizations: Not on file    Relationship status: Not on file  Other Topics Concern  . Not on file  Social History Narrative   01-05-18 Unable to ask abuse questions family with him today.   11-012-19 Unable to ask abuse questions family with him today.     Family History:  The patient's family history includes Arthritis-Osteo in his sister; Heart attack in his brother and other.   ROS:   Please see the history of present illness.    ROS All other systems reviewed and are negative.   PHYSICAL EXAM:   VS:  BP 122/70   Pulse 78   Ht 5\' 8"  (1.727 m)   Wt 178 lb (80.7 kg)    SpO2 95%   BMI 27.06 kg/m    GEN: Well nourished, well developed, in no acute distress  HEENT: normal  Neck: no JVD, carotid bruits, or masses Cardiac: regular rate and rhythm; no murmurs, rubs, or gallops,no edema  Respiratory:  clear to auscultation bilaterally, normal work of breathing GI: soft, nontender, nondistended, + BS MS: no deformity or atrophy  Skin: warm and dry, no rash Neuro:  Alert and Oriented x 3, Strength and sensation are intact Psych: euthymic mood, full affect  Wt Readings from Last 3 Encounters:  02/19/18 178 lb (80.7 kg)  02/13/18 180 lb 6.4 oz (81.8 kg)  02/09/18 177 lb (80.3 kg)      Studies/Labs Reviewed:   EKG:  EKG is not ordered today.    Recent Labs: 06/21/2017: Pro B Natriuretic peptide (BNP) 666.0 02/02/2018: B Natriuretic Peptide 605.2 02/05/2018: Hemoglobin 11.5; Platelets 111 02/09/2018: ALT 15; BUN 27; Creat 1.23; Magnesium 2.1; Potassium 4.3; Sodium 137   Lipid Panel    Component Value Date/Time   CHOL 75 02/03/2018 0524   CHOL 112 01/19/2018 0929   CHOL 97 09/09/2013 0957   TRIG 60 02/03/2018 0524   TRIG 92 09/09/2013 0957   HDL 28 (L) 02/03/2018 0524   HDL 35 (L) 01/19/2018 0929   HDL 37 (L) 09/09/2013 0957   CHOLHDL 2.7 02/03/2018 0524   VLDL 12 02/03/2018 0524   LDLCALC 35 02/03/2018 0524   LDLCALC 59 01/19/2018 0929   LDLCALC 42 09/09/2013 0957    Additional studies/ records that were reviewed today include:   Echocardiogram: 02/02/2018 Study Conclusions  - Left ventricle: The cavity size was normal. Wall thickness was   increased in a pattern of mild LVH. Systolic function was normal.   The estimated ejection fraction was in the range of 60% to 65%.   Wall motion was normal; there were no regional wall motion   abnormalities. -  Aortic valve: There was mild stenosis. Valve area (VTI): 1.68   cm^2. Valve area (Vmax): 1.4 cm^2. Valve area (Vmean): 1.34 cm^2. - Left atrium: The atrium was moderately dilated. - Right  atrium: The atrium was moderately dilated. - Pulmonary arteries: PA peak pressure: 58 mm Hg (S).   ASSESSMENT & PLAN:    1. CAD No angina. Continue BB and statin.   2. PAF - Regular rate and rhythm by exam. Eliquis changed to Lovenox due to DVT and PE while missed one dose per patient. Patient has followed up with radiology oncology. Likely plan for long term Lovenox. Daughter's concern about cost. Asking to change brand to generic Lovenox. Will defer to PCP as well as Dr. Irish Lack. 1 month refill given today. Advised patient to not to change to generic until heard from PCP.   3. DVT and PE while missed dose of eliquis  - AS above. Chest pain gradually improving.   4. Lung cancer - Patient follows with radiation oncology.  Concern for disease progression. Per note, plan to follow up with [facility].     Medication Adjustments/Labs and Tests Ordered: Current medicines are reviewed at length with the patient today.  Concerns regarding medicines are outlined above.  Medication changes, Labs and Tests ordered today are listed in the Patient Instructions below. Patient Instructions  Medication Instructions:  The current medical regimen is effective;  continue present plan and medications.  If you need a refill on your cardiac medications before your next appointment, please call your pharmacy.   Follow-Up: Follow up with Dr Irish Lack in 2 months.  Thank you for choosing [facility]!!         Jarrett Soho, [place]  02/19/2018 8:48 AM    [address], [place], [place]  [address] Phone: [phone]; Fax: [phone]

## 2018-02-19 NOTE — Patient Instructions (Signed)
Medication Instructions:  The current medical regimen is effective;  continue present plan and medications.  If you need a refill on your cardiac medications before your next appointment, please call your pharmacy.   Follow-Up: Follow up with Dr Irish Lack in 2 months.  Thank you for choosing [facility]!!

## 2018-02-20 DIAGNOSIS — I2699 Other pulmonary embolism without acute cor pulmonale: Secondary | ICD-10-CM | POA: Diagnosis not present

## 2018-02-20 DIAGNOSIS — Z923 Personal history of irradiation: Secondary | ICD-10-CM | POA: Diagnosis not present

## 2018-02-20 DIAGNOSIS — Z85118 Personal history of other malignant neoplasm of bronchus and lung: Secondary | ICD-10-CM | POA: Diagnosis not present

## 2018-02-20 DIAGNOSIS — M150 Primary generalized (osteo)arthritis: Secondary | ICD-10-CM | POA: Diagnosis not present

## 2018-02-20 DIAGNOSIS — M069 Rheumatoid arthritis, unspecified: Secondary | ICD-10-CM | POA: Diagnosis not present

## 2018-02-20 DIAGNOSIS — Z96653 Presence of artificial knee joint, bilateral: Secondary | ICD-10-CM | POA: Diagnosis not present

## 2018-02-20 DIAGNOSIS — I10 Essential (primary) hypertension: Secondary | ICD-10-CM | POA: Diagnosis not present

## 2018-02-20 DIAGNOSIS — I25118 Atherosclerotic heart disease of native coronary artery with other forms of angina pectoris: Secondary | ICD-10-CM | POA: Diagnosis not present

## 2018-02-20 DIAGNOSIS — I82492 Acute embolism and thrombosis of other specified deep vein of left lower extremity: Secondary | ICD-10-CM | POA: Diagnosis not present

## 2018-02-23 DIAGNOSIS — M150 Primary generalized (osteo)arthritis: Secondary | ICD-10-CM | POA: Diagnosis not present

## 2018-02-23 DIAGNOSIS — I82492 Acute embolism and thrombosis of other specified deep vein of left lower extremity: Secondary | ICD-10-CM | POA: Diagnosis not present

## 2018-02-23 DIAGNOSIS — M069 Rheumatoid arthritis, unspecified: Secondary | ICD-10-CM | POA: Diagnosis not present

## 2018-02-23 DIAGNOSIS — Z96653 Presence of artificial knee joint, bilateral: Secondary | ICD-10-CM | POA: Diagnosis not present

## 2018-02-23 DIAGNOSIS — I10 Essential (primary) hypertension: Secondary | ICD-10-CM | POA: Diagnosis not present

## 2018-02-23 DIAGNOSIS — Z923 Personal history of irradiation: Secondary | ICD-10-CM | POA: Diagnosis not present

## 2018-02-23 DIAGNOSIS — I25118 Atherosclerotic heart disease of native coronary artery with other forms of angina pectoris: Secondary | ICD-10-CM | POA: Diagnosis not present

## 2018-02-23 DIAGNOSIS — Z85118 Personal history of other malignant neoplasm of bronchus and lung: Secondary | ICD-10-CM | POA: Diagnosis not present

## 2018-02-23 DIAGNOSIS — I2699 Other pulmonary embolism without acute cor pulmonale: Secondary | ICD-10-CM | POA: Diagnosis not present

## 2018-02-26 DIAGNOSIS — M150 Primary generalized (osteo)arthritis: Secondary | ICD-10-CM | POA: Diagnosis not present

## 2018-02-26 DIAGNOSIS — Z923 Personal history of irradiation: Secondary | ICD-10-CM | POA: Diagnosis not present

## 2018-02-26 DIAGNOSIS — M069 Rheumatoid arthritis, unspecified: Secondary | ICD-10-CM | POA: Diagnosis not present

## 2018-02-26 DIAGNOSIS — Z85118 Personal history of other malignant neoplasm of bronchus and lung: Secondary | ICD-10-CM | POA: Diagnosis not present

## 2018-02-26 DIAGNOSIS — I25118 Atherosclerotic heart disease of native coronary artery with other forms of angina pectoris: Secondary | ICD-10-CM | POA: Diagnosis not present

## 2018-02-26 DIAGNOSIS — Z96653 Presence of artificial knee joint, bilateral: Secondary | ICD-10-CM | POA: Diagnosis not present

## 2018-02-26 DIAGNOSIS — I82492 Acute embolism and thrombosis of other specified deep vein of left lower extremity: Secondary | ICD-10-CM | POA: Diagnosis not present

## 2018-02-26 DIAGNOSIS — I2699 Other pulmonary embolism without acute cor pulmonale: Secondary | ICD-10-CM | POA: Diagnosis not present

## 2018-02-26 DIAGNOSIS — I10 Essential (primary) hypertension: Secondary | ICD-10-CM | POA: Diagnosis not present

## 2018-02-28 ENCOUNTER — Other Ambulatory Visit: Payer: Self-pay | Admitting: Family Medicine

## 2018-02-28 DIAGNOSIS — I82492 Acute embolism and thrombosis of other specified deep vein of left lower extremity: Secondary | ICD-10-CM | POA: Diagnosis not present

## 2018-02-28 DIAGNOSIS — Z85118 Personal history of other malignant neoplasm of bronchus and lung: Secondary | ICD-10-CM | POA: Diagnosis not present

## 2018-02-28 DIAGNOSIS — Z96653 Presence of artificial knee joint, bilateral: Secondary | ICD-10-CM | POA: Diagnosis not present

## 2018-02-28 DIAGNOSIS — M150 Primary generalized (osteo)arthritis: Secondary | ICD-10-CM | POA: Diagnosis not present

## 2018-02-28 DIAGNOSIS — I2699 Other pulmonary embolism without acute cor pulmonale: Secondary | ICD-10-CM | POA: Diagnosis not present

## 2018-02-28 DIAGNOSIS — I25118 Atherosclerotic heart disease of native coronary artery with other forms of angina pectoris: Secondary | ICD-10-CM | POA: Diagnosis not present

## 2018-02-28 DIAGNOSIS — I10 Essential (primary) hypertension: Secondary | ICD-10-CM | POA: Diagnosis not present

## 2018-02-28 DIAGNOSIS — Z923 Personal history of irradiation: Secondary | ICD-10-CM | POA: Diagnosis not present

## 2018-02-28 DIAGNOSIS — M069 Rheumatoid arthritis, unspecified: Secondary | ICD-10-CM | POA: Diagnosis not present

## 2018-02-28 NOTE — Telephone Encounter (Signed)
Requesting refill    Lorazepam  LOV: 02/09/18  LRF:  02/01/18

## 2018-03-05 ENCOUNTER — Encounter: Payer: Self-pay | Admitting: Family Medicine

## 2018-03-05 ENCOUNTER — Ambulatory Visit: Payer: Medicare HMO | Admitting: Pulmonary Disease

## 2018-03-05 ENCOUNTER — Encounter: Payer: Self-pay | Admitting: Pulmonary Disease

## 2018-03-05 ENCOUNTER — Ambulatory Visit (INDEPENDENT_AMBULATORY_CARE_PROVIDER_SITE_OTHER): Payer: Medicare HMO | Admitting: Family Medicine

## 2018-03-05 VITALS — BP 134/72 | HR 70 | Ht 65.35 in | Wt 177.2 lb

## 2018-03-05 VITALS — BP 106/64 | HR 68 | Temp 98.9°F | Resp 16 | Ht 68.0 in | Wt 177.0 lb

## 2018-03-05 DIAGNOSIS — C3490 Malignant neoplasm of unspecified part of unspecified bronchus or lung: Secondary | ICD-10-CM | POA: Diagnosis not present

## 2018-03-05 DIAGNOSIS — I2699 Other pulmonary embolism without acute cor pulmonale: Secondary | ICD-10-CM | POA: Diagnosis not present

## 2018-03-05 DIAGNOSIS — R0600 Dyspnea, unspecified: Secondary | ICD-10-CM

## 2018-03-05 DIAGNOSIS — S61011A Laceration without foreign body of right thumb without damage to nail, initial encounter: Secondary | ICD-10-CM | POA: Diagnosis not present

## 2018-03-05 NOTE — Progress Notes (Signed)
Subjective:    Patient ID: Evan [name], male    DOB: [birth year], 84 y.o.   MRN: [id number]  Over the weekend 3 days ago, the patient cut the volar aspect of the side of his right thumb with a kitchen knife.  There is now a hematoma that is approximately 2.5 cm in diameter oozing blood driving the skin edges apart patient has bled into the muscle body on the palmar aspect of the thumb just proximal to the IP joint.  When I applied pressur, approximately 3 mL of blood is able to be expressed from the hematoma/muscle body through the laceration.  The wound was then covered with Silvadene, wrapped and nonadherent gauze, and then wrapped tightly with Coban as a cause pressure dressing. Past Medical History:  Diagnosis Date  . Adenocarcinoma of right lung ([place]) 01/06/2016  . Anginal chest pain at rest [facility])    Chronic non, controlled on Lorazepam  . Anxiety   . Arthritis    "all over"   . BPH (benign prostatic hyperplasia)   . CAD (coronary artery disease)   . Chronic lower back pain   . Complication of anesthesia    "problems making water afterwards"  . Depression   . DJD (degenerative joint disease)   . Esophageal ulcer without bleeding    bid ppi indefinitely  . Family history of adverse reaction to anesthesia    "children w/PONV"  . GERD (gastroesophageal reflux disease)   . Headache    "couple/week maybe" (09/04/2015)  . Hemochromatosis    Possible, elevated iron stores, hook-like osteophytes on hand films, normal LFTs  . Hepatitis    "yellow jaundice as a baby"  . History of ASCVD    MULTIVESSEL  . Hyperlipidemia   . Hypertension   . Myocardial infarction ([place]) 1999  . Osteoarthritis   . Pneumonia    "several times; got a little now" (09/04/2015)  . Rheumatoid arthritis ([place])   . Subdural hematoma ([facility])    small after fall 09/2016-plavix held, neurosurgery consulted.  Asymptomatic.     Past Surgical History:  Procedure Laterality Date  . ARM SKIN LESION BIOPSY / EXCISION  Left 09/02/2015  . CATARACT EXTRACTION W/ INTRAOCULAR LENS  IMPLANT, BILATERAL Bilateral   . CHOLECYSTECTOMY OPEN    . CORNEAL TRANSPLANT Bilateral    "one at [facility]; one [facility]"  . CORONARY ANGIOPLASTY WITH STENT PLACEMENT    . CORONARY ARTERY BYPASS GRAFT  1999  . DESCENDING AORTIC ANEURYSM REPAIR W/ STENT     DE stent ostium into the right radial free graft at OM-, 02-2007  . ESOPHAGOGASTRODUODENOSCOPY N/A 11/09/2014   Procedure: ESOPHAGOGASTRODUODENOSCOPY (EGD);  Surgeon: [name], MD;  Location: [facility] ENDOSCOPY;  Service: Endoscopy;  Laterality: N/A;  . INGUINAL HERNIA REPAIR    . JOINT REPLACEMENT    . LEFT HEART CATH AND CORS/GRAFTS ANGIOGRAPHY N/A 06/27/2017   Procedure: LEFT HEART CATH AND CORS/GRAFTS ANGIOGRAPHY;  Surgeon: [name], MD;  Location: [place] CV LAB;  Service: Cardiovascular;  Laterality: N/A;  . NASAL SINUS SURGERY    . SHOULDER OPEN ROTATOR CUFF REPAIR Right   . TOTAL KNEE ARTHROPLASTY Bilateral    Current Outpatient Medications on File Prior to Visit  Medication Sig Dispense Refill  . Cholecalciferol 2000 units TABS Take 1 tablet (2,000 Units total) by mouth daily. 30 each 11  . enoxaparin (LOVENOX) 80 MG/0.8ML injection Inject 0.8 mLs (80 mg total) into the skin every 12 (twelve) hours. 60 Syringe 0  .  ENSURE (ENSURE) Take 237 mLs by mouth 2 (two) times daily between meals.    Marland Kitchen FLUoxetine (PROZAC) 20 MG capsule TAKE ONE CAPSULE BY MOUTH DAILY 90 capsule 3  . furosemide (LASIX) 20 MG tablet Take 1 tablet (20 mg total) once a day. Take an extra tablet (20 mg total) for increased swelling, weight gain, or shortness of breath. 180 tablet 3  . ipratropium-albuterol (DUONEB) 0.5-2.5 (3) MG/3ML SOLN Take 3 mLs by nebulization every 4 (four) hours as needed (or Chest Pain / Tightness). 360 mL 3  . leflunomide (ARAVA) 10 MG tablet Take 1 tablet (10 mg total) by mouth daily. 30 tablet 1  . Lifitegrast (XIIDRA) 5 % SOLN Place 1-2 drops into both eyes daily.     Marland Kitchen  LORazepam (ATIVAN) 1 MG tablet TAKE ONE TABLET BY MOUTH EVERY EIGHT HOURS AS NEEDED FOR ANXIETY 90 tablet 0  . magnesium oxide (MAG-OX) 400 (241.3 Mg) MG tablet Take 1 tablet (400 mg total) by mouth 2 (two) times daily. 60 tablet 0  . metoprolol succinate (TOPROL-XL) 25 MG 24 hr tablet TAKE ONE (1) TABLET BY MOUTH EVERY DAY 30 tablet 10  . nitroGLYCERIN (NITROSTAT) 0.4 MG SL tablet Place 1 tablet (0.4 mg total) under the tongue every 5 (five) minutes as needed for chest pain. 25 tablet 2  . pantoprazole (PROTONIX) 40 MG tablet Take one (1) tablet (40 mg) by mouth daily. 90 tablet 3  . rosuvastatin (CRESTOR) 5 MG tablet TAKE 5 mg  EVERY DAY 90 tablet 3  . tamsulosin (FLOMAX) 0.4 MG CAPS capsule TAKE 1 CAPSULE EVERY DAY 90 capsule 3   No current facility-administered medications on file prior to visit.    Allergies  Allergen Reactions  . Feldene [Piroxicam] Other (See Comments)    REACTION: Blisters  . Statins Other (See Comments)    Myalgias  . Doxycycline Other (See Comments)    REACTION:  unknown  . Latex Rash  . Tequin Anxiety  . Valium Other (See Comments)    REACTION:  unknown   Social History   Socioeconomic History  . Marital status: Widowed    Spouse name: Not on file  . Number of children: Not on file  . Years of education: Not on file  . Highest education level: Not on file  Occupational History  . Not on file  Social Needs  . Financial resource strain: Not on file  . Food insecurity:    Worry: Not on file    Inability: Not on file  . Transportation needs:    Medical: Not on file    Non-medical: Not on file  Tobacco Use  . Smoking status: Never Smoker  . Smokeless tobacco: Former Systems developer    Types: Chew  . Tobacco comment: "quit chewing in the 1960s"  Substance and Sexual Activity  . Alcohol use: No  . Drug use: No  . Sexual activity: Never  Lifestyle  . Physical activity:    Days per week: Not on file    Minutes per session: Not on file  . Stress: Not on  file  Relationships  . Social connections:    Talks on phone: Not on file    Gets together: Not on file    Attends religious service: Not on file    Active member of club or organization: Not on file    Attends meetings of clubs or organizations: Not on file    Relationship status: Not on file  . Intimate partner violence:  Fear of current or ex partner: Not on file    Emotionally abused: Not on file    Physically abused: Not on file    Forced sexual activity: Not on file  Other Topics Concern  . Not on file  Social History Narrative   01-05-18 Unable to ask abuse questions family with him today.   11-012-19 Unable to ask abuse questions family with him today.      Review of Systems  All other systems reviewed and are negative.      Objective:   Physical Exam  Constitutional: He appears well-developed and well-nourished. No distress.  Cardiovascular: Normal rate, regular rhythm and normal heart sounds.  No murmur heard. Pulmonary/Chest: Effort normal. No respiratory distress. He has no wheezes. He has rales. He exhibits no tenderness.  Musculoskeletal:       Right hand: He exhibits tenderness, deformity, laceration and swelling. He exhibits normal range of motion, no bony tenderness and normal capillary refill. Normal sensation noted. Normal strength noted. He exhibits no thumb/finger opposition.       Hands: Skin: He is not diaphoretic.  Vitals reviewed.         Assessment & Plan:  Laceration is superficial and limited to the skin over the volar aspect of the IP joint.  It does not extend through the subcutaneous tissue.  However I believe the patient has suffered a hematoma likely secondary to a small blood vessel injury coupled with his use of Lovenox.  I believe the hematoma is preventing the skin edges from approximating and healing.  Therefore I expressed all the blood from the hematoma, approximately 3 mL and applied a pressure dressing as explained in the  history of present illness.  Recheck the patient tomorrow.

## 2018-03-05 NOTE — Patient Instructions (Signed)
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

## 2018-03-05 NOTE — Progress Notes (Signed)
Synopsis: Former patient of Dr. Ashok Cordia with, lung cancer, history of DVT/PE  Subjective:    Patient ID: Evan [name], male    DOB: 24-Feb-1928, 84 y.o.   MRN: [id number]  HPI  Chief Complaint  Patient presents with  . Follow-up    shortness of breath, chest tightness, blood clots    [place] says that he has some days worse than others. > he says taht his breathing can be OK on some days, but on the bad days he can't catch > he doesn't feel chest congestion > he doesn't cough much > occasionally will produce alittle mucus in the mornings > no leg swelling > occasionally he will have chest pain, like an ache, will only last a few seconds.  > he doesn't use his albuterol inhaler very often, it helps when he takes it > he feels like his breathing isn't improving, but his family says taht he has actaully otten better      Past Medical History:  Diagnosis Date  . Adenocarcinoma of right lung ([place]) 01/06/2016  . Anginal chest pain at rest [facility])    Chronic non, controlled on Lorazepam  . Anxiety   . Arthritis    "all over"   . BPH (benign prostatic hyperplasia)   . CAD (coronary artery disease)   . Chronic lower back pain   . Complication of anesthesia    "problems making water afterwards"  . Depression   . DJD (degenerative joint disease)   . Esophageal ulcer without bleeding    bid ppi indefinitely  . Family history of adverse reaction to anesthesia    "children w/PONV"  . GERD (gastroesophageal reflux disease)   . Headache    "couple/week maybe" (09/04/2015)  . Hemochromatosis    Possible, elevated iron stores, hook-like osteophytes on hand films, normal LFTs  . Hepatitis    "yellow jaundice as a baby"  . History of ASCVD    MULTIVESSEL  . Hyperlipidemia   . Hypertension   . Myocardial infarction ([place]) 1999  . Osteoarthritis   . Pneumonia    "several times; got a little now" (09/04/2015)  . Rheumatoid arthritis ([place])   . Subdural hematoma ([facility])    small after fall  09/2016-plavix held, neurosurgery consulted.  Asymptomatic.       Review of Systems  Constitutional: Positive for fatigue. Negative for diaphoresis and fever.  HENT: Negative for rhinorrhea, sinus pressure and sinus pain.   Respiratory: Positive for cough and chest tightness. Negative for shortness of breath.   Cardiovascular: Negative for chest pain, palpitations and leg swelling.       Objective:   Physical Exam Vitals:   03/05/18 1046 03/05/18 1047  BP:  134/72  Pulse: 70 70  SpO2: 95% 95%  Weight:  177 lb 3.2 oz (80.4 kg)  Height:  5' 5.35" (1.66 m)    Gen: chronically ill appearing HENT: OP clear, TM's clear, neck supple PULM: CTA B, normal percussion CV: RRR, no mgr, trace edema GI: BS+, soft, nontender Derm: no cyanosis or rash Psyche: normal mood and affect    PFT 01/05/16:FVC 3.26 L (111%) FEV1 2.61 L (131%) FEV1/FVC 0.80 FEF 25-75 2.60 L (214%) negative bronchodilator response TLC 5.47 L (90%) RV 86% ERV 401% DLCO corrected 46% 2018 Spirometry today show some mild restriction with FVC at 70%, FEV1 is 87%, ratio 85%  WALK TEST 10/11/16: Walked 1lap / Baseline Sat 100% on RA / Nadir Sat 100% on RA (stopped due to feeling  his legs were weak)  IMAGING CT CHEST W/ CONTRAST 10/04/16 (personally reviewed by me):Trace left pleural effusion. Pleural thickening on the right noted. No pathologic mediastinal adenopathy. No pericardial effusion. Improvement in right basilar density compared with CT imaging performed in February. There is also decreased subpleural patchy groundglass opacities as well. No new or developing areas of opacification or groundglass. Questionable subpleural reticulation, primarily on the right. No obvious honeycombing or bronchiectasis appreciated.  PET CT 12/22/15 (per radiologist):  IMPRESSION: 1. Hypermetabolic (max SUV 3.9) posterior right lower lobe predominantly solid 2.9 cm pulmonary nodule, which is increased in size back to 2013, most  consistent with primary bronchogenic adenocarcinoma. 2. Patchy subpleural reticulation throughout both lungs, basilar predominant, suspicious for interstitial lung disease, not well characterized on this study. Consider correlation with high-resolution chest CT in 6-12 months to assess temporal pattern stability, as clinically warranted. 3. Hypermetabolic bilateral hilar and mediastinal lymphadenopathy is highly nonspecific given the suspected underlying interstitial lung disease, and could be reactive or metastatic. 4. Nonspecific hypermetabolic right level 2 neck lymph node, favor reactive node, cannot exclude metastasis. 5. No hypermetabolic osseous or abdominopelvic metastatic disease.  6. Mild hypermetabolism associated with subcutaneous fat stranding in the lateral right hip soft tissues, probably benign, correlate with any history of recent trauma or procedure in this location. 7. Relatively symmetric hypermetabolism in the bilateral glenohumeral joints with associated osteoarthritis, favor degenerative uptake. 8. Additional findings include mild cardiomegaly, aortic atherosclerosis, coronary atherosclerosis status post CABG and moderate sigmoid diverticulosis.  February 02, 2018 CT angiogram chest images independently reviewed showing nonspecific interstitial changes (interlobular septal thickening, no honeycombing, trace pleural effusions, patchy groundglass, there is an area of consolidation in the right lower lobe, likely past treatment for adenocarcinoma with radiation treatment)  CARDIAC TTE (05/23/16):The normal in size with moderate concentric hypertrophy. EF 58-85%OYDX normal diastolic function. LA moderately to severely dilated with RA normal in size. RV normal in size and function. Mild aortic stenosis with moderately calcified leaflets. Mild mitral regurgitation. Trivial pulmonic regurgitation. Mild tricuspid regurgitation. 02/2018 TTE normal LVEF, RVSP 58  mmHg  PATHOLOGY CT-GUIDED BIOPSY RIGHT LOWER LOBE MASS (12/30/15):Adenocarcinoma  Records reviewed from his hospital discharge February 03, 2018 where he was seen for acute pulmonary embolism with a DVT in the left peroneal vein.  Changed from Eliquis to Lovenox.  Was noted to have missed some doses of Eliquis prior.  Records from visit with radiation oncology February 13, 2018 where he was seen for stage Ia adenocarcinoma of the right lower lung, most recent CT chest showed disease stability.     Assessment & Plan:    No diagnosis found.  Discussion: [place] continues to struggle with shortness of breath after his recent very large pulmonary embolism which caused RV strain on his echocardiogram.  He also seems to benefit from [facility], I also think that he is physically deconditioned.  All of these are contributing to his shortness of breath.  He does not have COPD but he may have some degree of small airways disease from his prior tobacco use.  Plan: Pulmonary embolism: Continue taking Lovenox as directed by the oncology clinic Because you had strain in your heart seen on the most recent echocardiogram I am going to repeat that test in 2 months when you come back to see me This problem can cause you to feel short of breath for several months, so I think you should gradually increase your activity over the next few months  Physical deconditioning: I am going  to refer you to a physical therapist to help with exercises to get stronger  Shortness of breath: Due to small airways disease related to prior tobacco use Use DuoNeb twice a day no matter how you feel  Lung cancer: Continue follow-up with the oncology clinic  We will see you back in 2 months or sooner if needed  Greater than 50% of this 25-minute visit spent face-to-face    Current Outpatient Medications:  .  Cholecalciferol 2000 units TABS, Take 1 tablet (2,000 Units total) by mouth daily., Disp: 30 each, Rfl: 11 .   enoxaparin (LOVENOX) 80 MG/0.8ML injection, Inject 0.8 mLs (80 mg total) into the skin every 12 (twelve) hours., Disp: 60 Syringe, Rfl: 0 .  ENSURE (ENSURE), Take 237 mLs by mouth 2 (two) times daily between meals., Disp: , Rfl:  .  FLUoxetine (PROZAC) 20 MG capsule, TAKE ONE CAPSULE BY MOUTH DAILY, Disp: 90 capsule, Rfl: 3 .  furosemide (LASIX) 20 MG tablet, Take 1 tablet (20 mg total) once a day. Take an extra tablet (20 mg total) for increased swelling, weight gain, or shortness of breath., Disp: 180 tablet, Rfl: 3 .  ipratropium-albuterol (DUONEB) 0.5-2.5 (3) MG/3ML SOLN, Take 3 mLs by nebulization every 4 (four) hours as needed (or Chest Pain / Tightness)., Disp: 360 mL, Rfl: 3 .  leflunomide (ARAVA) 10 MG tablet, Take 1 tablet (10 mg total) by mouth daily., Disp: 30 tablet, Rfl: 1 .  Lifitegrast (XIIDRA) 5 % SOLN, Place 1-2 drops into both eyes daily. , Disp: , Rfl:  .  LORazepam (ATIVAN) 1 MG tablet, TAKE ONE TABLET BY MOUTH EVERY EIGHT HOURS AS NEEDED FOR ANXIETY, Disp: 90 tablet, Rfl: 0 .  magnesium oxide (MAG-OX) 400 (241.3 Mg) MG tablet, Take 1 tablet (400 mg total) by mouth 2 (two) times daily., Disp: 60 tablet, Rfl: 0 .  metoprolol succinate (TOPROL-XL) 25 MG 24 hr tablet, TAKE ONE (1) TABLET BY MOUTH EVERY DAY, Disp: 30 tablet, Rfl: 10 .  nitroGLYCERIN (NITROSTAT) 0.4 MG SL tablet, Place 1 tablet (0.4 mg total) under the tongue every 5 (five) minutes as needed for chest pain., Disp: 25 tablet, Rfl: 2 .  pantoprazole (PROTONIX) 40 MG tablet, Take one (1) tablet (40 mg) by mouth daily., Disp: 90 tablet, Rfl: 3 .  rosuvastatin (CRESTOR) 5 MG tablet, TAKE 5 mg  EVERY DAY, Disp: 90 tablet, Rfl: 3 .  tamsulosin (FLOMAX) 0.4 MG CAPS capsule, TAKE 1 CAPSULE EVERY DAY, Disp: 90 capsule, Rfl: 3

## 2018-03-06 ENCOUNTER — Ambulatory Visit (INDEPENDENT_AMBULATORY_CARE_PROVIDER_SITE_OTHER): Payer: Medicare HMO | Admitting: Family Medicine

## 2018-03-06 ENCOUNTER — Encounter: Payer: Self-pay | Admitting: Family Medicine

## 2018-03-06 VITALS — BP 118/68 | HR 70 | Temp 97.9°F | Resp 16 | Ht 68.0 in | Wt 177.0 lb

## 2018-03-06 DIAGNOSIS — S61011D Laceration without foreign body of right thumb without damage to nail, subsequent encounter: Secondary | ICD-10-CM

## 2018-03-06 DIAGNOSIS — S61011S Laceration without foreign body of right thumb without damage to nail, sequela: Secondary | ICD-10-CM | POA: Diagnosis not present

## 2018-03-06 NOTE — Progress Notes (Signed)
Subjective:    Patient ID: Evan [name], male    DOB: December 22, 1927, 84 y.o.   MRN: [id number] 03/05/18 Over the weekend 3 days ago, the patient cut the volar aspect of the side of his right thumb with a kitchen knife.  There is now a hematoma that is approximately 2.5 cm in diameter oozing blood driving the skin edges apart patient has bled into the muscle body on the palmar aspect of the thumb just proximal to the IP joint.  When I applied pressur, approximately 3 mL of blood is able to be expressed from the hematoma/muscle body through the laceration.  The wound was then covered with Silvadene, wrapped and nonadherent gauze, and then wrapped tightly with Coban as a cause pressure dressing.  At that time, my plan was: Laceration is superficial and limited to the skin over the volar aspect of the IP joint.  It does not extend through the subcutaneous tissue.  However I believe the patient has suffered a hematoma likely secondary to a small blood vessel injury coupled with his use of Lovenox.  I believe the hematoma is preventing the skin edges from approximating and healing.  Therefore I expressed all the blood from the hematoma, approximately 3 mL and applied a pressure dressing as explained in the history of present illness.  Recheck the patient tomorrow.  03/06/18 Swelling has improved substantially today.  Patient's right thumb is approaching the size of his left thumb.  Now that the swelling has gone down, laceration is revealed to be a V-shaped cut on the volar aspect of the right thumb approximately 1.5 cm on each side.  There is still swelling and a hematoma under the cut.  Firm pressure was applied and only trace amount of blood is expressed.  There is no redness warmth or evidence of infection.  The surrounding skin around the hematoma has separated from the underlying subcutaneous skin and is slightly hypopigmented and macerated having been in a dressing x 24 hours. Past Medical History:    Diagnosis Date  . Adenocarcinoma of right lung ([place]) 01/06/2016  . Anginal chest pain at rest [facility])    Chronic non, controlled on Lorazepam  . Anxiety   . Arthritis    "all over"   . BPH (benign prostatic hyperplasia)   . CAD (coronary artery disease)   . Chronic lower back pain   . Complication of anesthesia    "problems making water afterwards"  . Depression   . DJD (degenerative joint disease)   . Esophageal ulcer without bleeding    bid ppi indefinitely  . Family history of adverse reaction to anesthesia    "children w/PONV"  . GERD (gastroesophageal reflux disease)   . Headache    "couple/week maybe" (09/04/2015)  . Hemochromatosis    Possible, elevated iron stores, hook-like osteophytes on hand films, normal LFTs  . Hepatitis    "yellow jaundice as a baby"  . History of ASCVD    MULTIVESSEL  . Hyperlipidemia   . Hypertension   . Myocardial infarction ([place]) 1999  . Osteoarthritis   . Pneumonia    "several times; got a little now" (09/04/2015)  . Rheumatoid arthritis ([place])   . Subdural hematoma ([facility])    small after fall 09/2016-plavix held, neurosurgery consulted.  Asymptomatic.     Past Surgical History:  Procedure Laterality Date  . ARM SKIN LESION BIOPSY / EXCISION Left 09/02/2015  . CATARACT EXTRACTION W/ INTRAOCULAR LENS  IMPLANT, BILATERAL Bilateral   .  CHOLECYSTECTOMY OPEN    . CORNEAL TRANSPLANT Bilateral    "[facility]; one at [facility]"  . CORONARY ANGIOPLASTY WITH STENT PLACEMENT    . CORONARY ARTERY BYPASS GRAFT  1999  . DESCENDING AORTIC ANEURYSM REPAIR W/ STENT     DE stent ostium into the right radial free graft at OM-, 02-2007  . ESOPHAGOGASTRODUODENOSCOPY N/A 11/09/2014   Procedure: ESOPHAGOGASTRODUODENOSCOPY (EGD);  Surgeon: [name], MD;  Location: [facility] ENDOSCOPY;  Service: Endoscopy;  Laterality: N/A;  . INGUINAL HERNIA REPAIR    . JOINT REPLACEMENT    . LEFT HEART CATH AND CORS/GRAFTS ANGIOGRAPHY N/A 06/27/2017   Procedure: LEFT HEART CATH AND  CORS/GRAFTS ANGIOGRAPHY;  Surgeon: [name], MD;  Location: [place] CV LAB;  Service: Cardiovascular;  Laterality: N/A;  . NASAL SINUS SURGERY    . SHOULDER OPEN ROTATOR CUFF REPAIR Right   . TOTAL KNEE ARTHROPLASTY Bilateral    Current Outpatient Medications on File Prior to Visit  Medication Sig Dispense Refill  . Cholecalciferol 2000 units TABS Take 1 tablet (2,000 Units total) by mouth daily. 30 each 11  . enoxaparin (LOVENOX) 80 MG/0.8ML injection Inject 0.8 mLs (80 mg total) into the skin every 12 (twelve) hours. 60 Syringe 0  . ENSURE (ENSURE) Take 237 mLs by mouth 2 (two) times daily between meals.    Marland Kitchen FLUoxetine (PROZAC) 20 MG capsule TAKE ONE CAPSULE BY MOUTH DAILY 90 capsule 3  . furosemide (LASIX) 20 MG tablet Take 1 tablet (20 mg total) once a day. Take an extra tablet (20 mg total) for increased swelling, weight gain, or shortness of breath. 180 tablet 3  . ipratropium-albuterol (DUONEB) 0.5-2.5 (3) MG/3ML SOLN Take 3 mLs by nebulization every 4 (four) hours as needed (or Chest Pain / Tightness). 360 mL 3  . leflunomide (ARAVA) 10 MG tablet Take 1 tablet (10 mg total) by mouth daily. 30 tablet 1  . Lifitegrast (XIIDRA) 5 % SOLN Place 1-2 drops into both eyes daily.     Marland Kitchen LORazepam (ATIVAN) 1 MG tablet TAKE ONE TABLET BY MOUTH EVERY EIGHT HOURS AS NEEDED FOR ANXIETY 90 tablet 0  . magnesium oxide (MAG-OX) 400 (241.3 Mg) MG tablet Take 1 tablet (400 mg total) by mouth 2 (two) times daily. 60 tablet 0  . metoprolol succinate (TOPROL-XL) 25 MG 24 hr tablet TAKE ONE (1) TABLET BY MOUTH EVERY DAY 30 tablet 10  . nitroGLYCERIN (NITROSTAT) 0.4 MG SL tablet Place 1 tablet (0.4 mg total) under the tongue every 5 (five) minutes as needed for chest pain. 25 tablet 2  . pantoprazole (PROTONIX) 40 MG tablet Take one (1) tablet (40 mg) by mouth daily. 90 tablet 3  . rosuvastatin (CRESTOR) 5 MG tablet TAKE 5 mg  EVERY DAY 90 tablet 3  . tamsulosin (FLOMAX) 0.4 MG CAPS capsule TAKE 1  CAPSULE EVERY DAY 90 capsule 3   No current facility-administered medications on file prior to visit.    Allergies  Allergen Reactions  . Feldene [Piroxicam] Other (See Comments)    REACTION: Blisters  . Statins Other (See Comments)    Myalgias  . Doxycycline Other (See Comments)    REACTION:  unknown  . Latex Rash  . Tequin Anxiety  . Valium Other (See Comments)    REACTION:  unknown   Social History   Socioeconomic History  . Marital status: Widowed    Spouse name: Not on file  . Number of children: Not on file  . Years of education:  Not on file  . Highest education level: Not on file  Occupational History  . Not on file  Social Needs  . Financial resource strain: Not on file  . Food insecurity:    Worry: Not on file    Inability: Not on file  . Transportation needs:    Medical: Not on file    Non-medical: Not on file  Tobacco Use  . Smoking status: Never Smoker  . Smokeless tobacco: Former Systems developer    Types: Chew  . Tobacco comment: "quit chewing in the 1960s"  Substance and Sexual Activity  . Alcohol use: No  . Drug use: No  . Sexual activity: Never  Lifestyle  . Physical activity:    Days per week: Not on file    Minutes per session: Not on file  . Stress: Not on file  Relationships  . Social connections:    Talks on phone: Not on file    Gets together: Not on file    Attends religious service: Not on file    Active member of club or organization: Not on file    Attends meetings of clubs or organizations: Not on file    Relationship status: Not on file  . Intimate partner violence:    Fear of current or ex partner: Not on file    Emotionally abused: Not on file    Physically abused: Not on file    Forced sexual activity: Not on file  Other Topics Concern  . Not on file  Social History Narrative   01-05-18 Unable to ask abuse questions family with him today.   11-012-19 Unable to ask abuse questions family with him today.      Review of Systems    All other systems reviewed and are negative.      Objective:   Physical Exam  Constitutional: He appears well-developed and well-nourished. No distress.  Cardiovascular: Normal rate, regular rhythm and normal heart sounds.  No murmur heard. Pulmonary/Chest: Effort normal. No respiratory distress. He has no wheezes. He has rales. He exhibits no tenderness.  Musculoskeletal:       Right hand: He exhibits tenderness, deformity, laceration and swelling. He exhibits normal range of motion, no bony tenderness and normal capillary refill. Normal sensation noted. Normal strength noted. He exhibits no thumb/finger opposition.       Hands: Skin: He is not diaphoretic.  Vitals reviewed.         Assessment & Plan:  Thumb laceration, right, sequela  Minimal residual bleeding.  Wound was cleaned thoroughly with alcohol and saline.  Silvadene was applied to the wound.  The wound was then wrapped in nonadherent gauze and covered with Coban.  Recommended daily dressing changes and recheck the patient on Friday.  Appears improved today.  Patient states that it feels better

## 2018-03-07 DIAGNOSIS — I82492 Acute embolism and thrombosis of other specified deep vein of left lower extremity: Secondary | ICD-10-CM | POA: Diagnosis not present

## 2018-03-07 DIAGNOSIS — M069 Rheumatoid arthritis, unspecified: Secondary | ICD-10-CM | POA: Diagnosis not present

## 2018-03-07 DIAGNOSIS — I2699 Other pulmonary embolism without acute cor pulmonale: Secondary | ICD-10-CM | POA: Diagnosis not present

## 2018-03-07 DIAGNOSIS — Z96653 Presence of artificial knee joint, bilateral: Secondary | ICD-10-CM | POA: Diagnosis not present

## 2018-03-07 DIAGNOSIS — I25118 Atherosclerotic heart disease of native coronary artery with other forms of angina pectoris: Secondary | ICD-10-CM | POA: Diagnosis not present

## 2018-03-07 DIAGNOSIS — Z923 Personal history of irradiation: Secondary | ICD-10-CM | POA: Diagnosis not present

## 2018-03-07 DIAGNOSIS — M150 Primary generalized (osteo)arthritis: Secondary | ICD-10-CM | POA: Diagnosis not present

## 2018-03-07 DIAGNOSIS — Z85118 Personal history of other malignant neoplasm of bronchus and lung: Secondary | ICD-10-CM | POA: Diagnosis not present

## 2018-03-07 DIAGNOSIS — I10 Essential (primary) hypertension: Secondary | ICD-10-CM | POA: Diagnosis not present

## 2018-03-08 DIAGNOSIS — I25118 Atherosclerotic heart disease of native coronary artery with other forms of angina pectoris: Secondary | ICD-10-CM | POA: Diagnosis not present

## 2018-03-08 DIAGNOSIS — M150 Primary generalized (osteo)arthritis: Secondary | ICD-10-CM | POA: Diagnosis not present

## 2018-03-08 DIAGNOSIS — M069 Rheumatoid arthritis, unspecified: Secondary | ICD-10-CM | POA: Diagnosis not present

## 2018-03-08 DIAGNOSIS — Z96653 Presence of artificial knee joint, bilateral: Secondary | ICD-10-CM | POA: Diagnosis not present

## 2018-03-08 DIAGNOSIS — I2699 Other pulmonary embolism without acute cor pulmonale: Secondary | ICD-10-CM | POA: Diagnosis not present

## 2018-03-08 DIAGNOSIS — Z923 Personal history of irradiation: Secondary | ICD-10-CM | POA: Diagnosis not present

## 2018-03-08 DIAGNOSIS — I10 Essential (primary) hypertension: Secondary | ICD-10-CM | POA: Diagnosis not present

## 2018-03-08 DIAGNOSIS — Z85118 Personal history of other malignant neoplasm of bronchus and lung: Secondary | ICD-10-CM | POA: Diagnosis not present

## 2018-03-08 DIAGNOSIS — I82492 Acute embolism and thrombosis of other specified deep vein of left lower extremity: Secondary | ICD-10-CM | POA: Diagnosis not present

## 2018-03-09 ENCOUNTER — Encounter: Payer: Self-pay | Admitting: Family Medicine

## 2018-03-09 ENCOUNTER — Ambulatory Visit (INDEPENDENT_AMBULATORY_CARE_PROVIDER_SITE_OTHER): Payer: Medicare HMO | Admitting: Family Medicine

## 2018-03-09 VITALS — BP 110/72 | HR 76 | Temp 96.7°F | Resp 14 | Ht 68.0 in | Wt 179.0 lb

## 2018-03-09 DIAGNOSIS — S61011D Laceration without foreign body of right thumb without damage to nail, subsequent encounter: Secondary | ICD-10-CM | POA: Diagnosis not present

## 2018-03-09 DIAGNOSIS — S61011S Laceration without foreign body of right thumb without damage to nail, sequela: Secondary | ICD-10-CM | POA: Diagnosis not present

## 2018-03-09 MED ORDER — SILVER SULFADIAZINE 1 % EX CREA: 1.0000 "application " | TOPICAL_CREAM | Freq: Every day | CUTANEOUS | 0 refills | Status: DC

## 2018-03-09 NOTE — Progress Notes (Signed)
Subjective:    Patient ID: Evan [name], male    DOB: [birth year], 84 y.o.   MRN: [id number] 03/05/18 Over the weekend 3 days ago, the patient cut the volar aspect of the side of his right thumb with a kitchen knife.  There is now a hematoma that is approximately 2.5 cm in diameter oozing blood driving the skin edges apart patient has bled into the muscle body on the palmar aspect of the thumb just proximal to the IP joint.  When I applied pressur, approximately 3 mL of blood is able to be expressed from the hematoma/muscle body through the laceration.  The wound was then covered with Silvadene, wrapped and nonadherent gauze, and then wrapped tightly with Coban as a cause pressure dressing.  At that time, my plan was: Laceration is superficial and limited to the skin over the volar aspect of the IP joint.  It does not extend through the subcutaneous tissue.  However I believe the patient has suffered a hematoma likely secondary to a small blood vessel injury coupled with his use of Lovenox.  I believe the hematoma is preventing the skin edges from approximating and healing.  Therefore I expressed all the blood from the hematoma, approximately 3 mL and applied a pressure dressing as explained in the history of present illness.  Recheck the patient tomorrow.  03/06/18 Swelling has improved substantially today.  Patient's right thumb is approaching the size of his left thumb.  Now that the swelling has gone down, laceration is revealed to be a V-shaped cut on the volar aspect of the right thumb approximately 1.5 cm on each side.  There is still swelling and a hematoma under the cut.  Firm pressure was applied and only trace amount of blood is expressed.  There is no redness warmth or evidence of infection.  The surrounding skin around the hematoma has separated from the underlying subcutaneous skin and is slightly hypopigmented and macerated having been in a dressing x 24 hours.  At that time, my plan  was: Minimal residual bleeding.  Wound was cleaned thoroughly with alcohol and saline.  Silvadene was applied to the wound.  The wound was then wrapped in nonadherent gauze and covered with Coban.  Recommended daily dressing changes and recheck the patient on Friday.  Appears improved today.  Patient states that it feels better  03/09/18 Patient is here today for follow-up.  He denies any fevers or chills.  There is no erythema around the wound.  As described last time, there was a triangle shaped laceration on the volar aspect of the right thumb over the IP joint.  The "triangle" piece of skin has sloughed off in between now leaving a circular wound approximately 13 mm in diameter down to the underlying subcutaneous tissue.  There is no bleeding.  There is no fluctuance.  There is no pain.  He has normal range of motion and sensation in the thumb Past Medical History:  Diagnosis Date  . Adenocarcinoma of right lung ([place]) 01/06/2016  . 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 Anxiety   . Arthritis    "all over"   . BPH (benign prostatic hyperplasia)   . CAD (coronary artery disease)   . Chronic lower back pain   . Complication of anesthesia    "problems making water afterwards"  . Depression   . DJD (degenerative joint disease)   . Esophageal ulcer without bleeding  bid ppi indefinitely  . Family history of adverse reaction to anesthesia    "children w/PONV"  . GERD (gastroesophageal reflux disease)   . Headache    "couple/week maybe" (09/04/2015)  . Hemochromatosis    Possible, elevated iron stores, hook-like osteophytes on hand films, normal LFTs  . Hepatitis    "yellow jaundice as a baby"  . History of ASCVD    MULTIVESSEL  . Hyperlipidemia   . Hypertension   . Myocardial infarction ([place]) 1999  . Osteoarthritis   . Pneumonia    "several times; got a little now" (09/04/2015)  . Rheumatoid arthritis ([place])   . Subdural hematoma ([facility])    small  after fall 09/2016-plavix held, neurosurgery consulted.  Asymptomatic.     Past Surgical History:  Procedure Laterality Date  . ARM SKIN LESION BIOPSY / EXCISION Left 09/02/2015  . CATARACT EXTRACTION W/ INTRAOCULAR LENS  IMPLANT, BILATERAL Bilateral   . CHOLECYSTECTOMY OPEN    . CORNEAL TRANSPLANT Bilateral    "one at [facility]; one at [facility]"  . CORONARY ANGIOPLASTY WITH STENT PLACEMENT    . CORONARY ARTERY BYPASS GRAFT  1999  . DESCENDING AORTIC ANEURYSM REPAIR W/ STENT     DE stent ostium into the right radial free graft at OM-, 02-2007  . ESOPHAGOGASTRODUODENOSCOPY N/A 11/09/2014   Procedure: ESOPHAGOGASTRODUODENOSCOPY (EGD);  Surgeon: [name], MD;  Location: [facility] ENDOSCOPY;  Service: Endoscopy;  Laterality: N/A;  . INGUINAL HERNIA REPAIR    . JOINT REPLACEMENT    . LEFT HEART CATH AND CORS/GRAFTS ANGIOGRAPHY N/A 06/27/2017   Procedure: LEFT HEART CATH AND CORS/GRAFTS ANGIOGRAPHY;  Surgeon: [name], MD;  Location: [place] CV LAB;  Service: Cardiovascular;  Laterality: N/A;  . NASAL SINUS SURGERY    . SHOULDER OPEN ROTATOR CUFF REPAIR Right   . TOTAL KNEE ARTHROPLASTY Bilateral    Current Outpatient Medications on File Prior to Visit  Medication Sig Dispense Refill  . Cholecalciferol 2000 units TABS Take 1 tablet (2,000 Units total) by mouth daily. 30 each 11  . enoxaparin (LOVENOX) 80 MG/0.8ML injection Inject 0.8 mLs (80 mg total) into the skin every 12 (twelve) hours. 60 Syringe 0  . ENSURE (ENSURE) Take 237 mLs by mouth 2 (two) times daily between meals.    Marland Kitchen FLUoxetine (PROZAC) 20 MG capsule TAKE ONE CAPSULE BY MOUTH DAILY 90 capsule 3  . furosemide (LASIX) 20 MG tablet Take 1 tablet (20 mg total) once a day. Take an extra tablet (20 mg total) for increased swelling, weight gain, or shortness of breath. 180 tablet 3  . ipratropium-albuterol (DUONEB) 0.5-2.5 (3) MG/3ML SOLN Take 3 mLs by nebulization every 4 (four) hours as needed (or Chest Pain / Tightness). 360 mL 3  .  leflunomide (ARAVA) 10 MG tablet Take 1 tablet (10 mg total) by mouth daily. 30 tablet 1  . Lifitegrast (XIIDRA) 5 % SOLN Place 1-2 drops into both eyes daily.     Marland Kitchen LORazepam (ATIVAN) 1 MG tablet TAKE ONE TABLET BY MOUTH EVERY EIGHT HOURS AS NEEDED FOR ANXIETY 90 tablet 0  . magnesium oxide (MAG-OX) 400 (241.3 Mg) MG tablet Take 1 tablet (400 mg total) by mouth 2 (two) times daily. 60 tablet 0  . metoprolol succinate (TOPROL-XL) 25 MG 24 hr tablet TAKE ONE (1) TABLET BY MOUTH EVERY DAY 30 tablet 10  . nitroGLYCERIN (NITROSTAT) 0.4 MG SL tablet Place 1 tablet (0.4 mg total) under the tongue every 5 (five) minutes as needed for chest pain. 25  tablet 2  . pantoprazole (PROTONIX) 40 MG tablet Take one (1) tablet (40 mg) by mouth daily. 90 tablet 3  . rosuvastatin (CRESTOR) 5 MG tablet TAKE 5 mg  EVERY DAY 90 tablet 3  . tamsulosin (FLOMAX) 0.4 MG CAPS capsule TAKE 1 CAPSULE EVERY DAY 90 capsule 3   No current facility-administered medications on file prior to visit.    Allergies  Allergen Reactions  . Feldene [Piroxicam] Other (See Comments)    REACTION: Blisters  . Statins Other (See Comments)    Myalgias  . Doxycycline Other (See Comments)    REACTION:  unknown  . Latex Rash  . Tequin Anxiety  . Valium Other (See Comments)    REACTION:  unknown   Social History   Socioeconomic History  . Marital status: Widowed    Spouse name: Not on file  . Number of children: Not on file  . Years of education: Not on file  . Highest education level: Not on file  Occupational History  . Not on file  Social Needs  . Financial resource strain: Not on file  . Food insecurity:    Worry: Not on file    Inability: Not on file  . Transportation needs:    Medical: Not on file    Non-medical: Not on file  Tobacco Use  . Smoking status: Never Smoker  . Smokeless tobacco: Former Systems developer    Types: Chew  . Tobacco comment: "quit chewing in the 1960s"  Substance and Sexual Activity  . Alcohol use: No   . Drug use: No  . Sexual activity: Never  Lifestyle  . Physical activity:    Days per week: Not on file    Minutes per session: Not on file  . Stress: Not on file  Relationships  . Social connections:    Talks on phone: Not on file    Gets together: Not on file    Attends religious service: Not on file    Active member of club or organization: Not on file    Attends meetings of clubs or organizations: Not on file    Relationship status: Not on file  . Intimate partner violence:    Fear of current or ex partner: Not on file    Emotionally abused: Not on file    Physically abused: Not on file    Forced sexual activity: Not on file  Other Topics Concern  . Not on file  Social History Narrative   01-05-18 Unable to ask abuse questions family with him today.   11-012-19 Unable to ask abuse questions family with him today.      Review of Systems  All other systems reviewed and are negative.      Objective:   Physical Exam  Constitutional: He appears well-developed and well-nourished. No distress.  Cardiovascular: Normal rate, regular rhythm and normal heart sounds.  No murmur heard. Pulmonary/Chest: Effort normal. No respiratory distress. He has no wheezes. He has no rales. He exhibits no tenderness.  Musculoskeletal:       Right hand: He exhibits tenderness, deformity, laceration and swelling. He exhibits normal range of motion, no bony tenderness and normal capillary refill. Normal sensation noted. Normal strength noted. He exhibits no thumb/finger opposition.       Hands: Skin: He is not diaphoretic.  Vitals reviewed.         Assessment & Plan:  Thumb laceration, right, sequela  Wound will have to close the secondary intention.  No evidence of  cellulitis or secondary infection.  Continue daily dressing changes by applying Silvadene to the wound, covering with nonadherent gauze, and wrapping the wound with Coban.  Recheck in 1 week or sooner if worse.

## 2018-03-12 DIAGNOSIS — J449 Chronic obstructive pulmonary disease, unspecified: Secondary | ICD-10-CM | POA: Diagnosis not present

## 2018-03-15 ENCOUNTER — Ambulatory Visit (INDEPENDENT_AMBULATORY_CARE_PROVIDER_SITE_OTHER): Payer: Medicare HMO | Admitting: Family Medicine

## 2018-03-15 ENCOUNTER — Other Ambulatory Visit: Payer: Self-pay

## 2018-03-15 ENCOUNTER — Encounter: Payer: Self-pay | Admitting: Family Medicine

## 2018-03-15 VITALS — BP 124/68 | HR 68 | Temp 97.9°F | Resp 14 | Ht 68.0 in | Wt 179.0 lb

## 2018-03-15 DIAGNOSIS — S61011S Laceration without foreign body of right thumb without damage to nail, sequela: Secondary | ICD-10-CM | POA: Diagnosis not present

## 2018-03-15 DIAGNOSIS — S61011D Laceration without foreign body of right thumb without damage to nail, subsequent encounter: Secondary | ICD-10-CM | POA: Diagnosis not present

## 2018-03-15 MED ORDER — FLUOXETINE HCL 10 MG PO CAPS: 10.0000 mg | ORAL_CAPSULE | Freq: Every day | ORAL | 3 refills | Status: DC

## 2018-03-15 MED ORDER — AZITHROMYCIN 250 MG PO TABS: ORAL_TABLET | ORAL | 0 refills | Status: DC

## 2018-03-15 NOTE — Progress Notes (Signed)
Subjective:    Patient ID: Evan [name], male    DOB: 15-Aug-1927, 84 y.o.   MRN: [id number] 03/05/18 Over the weekend 3 days ago, the patient cut the volar aspect of the side of his right thumb with a kitchen knife.  There is now a hematoma that is approximately 2.5 cm in diameter oozing blood driving the skin edges apart patient has bled into the muscle body on the palmar aspect of the thumb just proximal to the IP joint.  When I applied pressur, approximately 3 mL of blood is able to be expressed from the hematoma/muscle body through the laceration.  The wound was then covered with Silvadene, wrapped and nonadherent gauze, and then wrapped tightly with Coban as a cause pressure dressing.  At that time, my plan was: Laceration is superficial and limited to the skin over the volar aspect of the IP joint.  It does not extend through the subcutaneous tissue.  However I believe the patient has suffered a hematoma likely secondary to a small blood vessel injury coupled with his use of Lovenox.  I believe the hematoma is preventing the skin edges from approximating and healing.  Therefore I expressed all the blood from the hematoma, approximately 3 mL and applied a pressure dressing as explained in the history of present illness.  Recheck the patient tomorrow.  03/06/18 Swelling has improved substantially today.  Patient's right thumb is approaching the size of his left thumb.  Now that the swelling has gone down, laceration is revealed to be a V-shaped cut on the volar aspect of the right thumb approximately 1.5 cm on each side.  There is still swelling and a hematoma under the cut.  Firm pressure was applied and only trace amount of blood is expressed.  There is no redness warmth or evidence of infection.  The surrounding skin around the hematoma has separated from the underlying subcutaneous skin and is slightly hypopigmented and macerated having been in a dressing x 24 hours.  At that time, my plan  was: Minimal residual bleeding.  Wound was cleaned thoroughly with alcohol and saline.  Silvadene was applied to the wound.  The wound was then wrapped in nonadherent gauze and covered with Coban.  Recommended daily dressing changes and recheck the patient on Friday.  Appears improved today.  Patient states that it feels better  03/09/18 Patient is here today for follow-up.  He denies any fevers or chills.  There is no erythema around the wound.  As described last time, there was a triangle shaped laceration on the volar aspect of the right thumb over the IP joint.  The "triangle" piece of skin has sloughed off in between now leaving a circular wound approximately 13 mm in diameter down to the underlying subcutaneous tissue.  There is no bleeding.  There is no fluctuance.  There is no pain.  He has normal range of motion and sensation in the thumb.  At that time, my plan was: Wound will have to close the secondary intention.  No evidence of cellulitis or secondary infection.  Continue daily dressing changes by applying Silvadene to the wound, covering with nonadherent gauze, and wrapping the wound with Coban.  Recheck in 1 week or sooner if worse.  03/15/18 The wound on his right thumb has completely healed!.  There is no residual wound.  Patient states his finger feels fine.  However his daughter is concerned because he is having nightmares.  Both she and the patient attribute  this to the Prozac.  He has been on this medication for quite some time however they state that the nightmares began roughly the same time he started taking the Prozac.  They occur most nights.  They wake him from sleep.  Also around the same time there has been tremendous increased stress in his family as his son who has bipolar has been having significant social issues including multiple arrest and threatening members of the family.  However the family is concerned that they believe Prozac is likely causing the nightmares and  increased anxiety. Past Medical History:  Diagnosis Date  . Adenocarcinoma of right lung ([place]) 01/06/2016  . [facility]inal [facility] at [facility])    Chronic non, controlled on Lorazepam  . Anxiety   . Arthritis    "all over"   . BPH (benign prostatic hyperplasia)   . CAD (coronary artery disease)   . Chronic lower back pain   . Complication of anesthesia    "problems making water afterwards"  . Depression   . DJD (degenerative joint disease)   . Esophageal ulcer without bleeding    bid ppi indefinitely  . Family history of adverse reaction to anesthesia    "children w/PONV"  . GERD (gastroesophageal reflux disease)   . Headache    "couple/week maybe" (09/04/2015)  . Hemochromatosis    Possible, elevated iron stores, hook-like osteophytes on hand films, normal LFTs  . Hepatitis    "yellow jaundice as a baby"  . History of ASCVD    MULTIVESSEL  . Hyperlipidemia   . Hypertension   . Myocardial infarction ([place]) 1999  . Osteoarthritis   . Pneumonia    "several times; got a little now" (09/04/2015)  . Rheumatoid arthritis ([place])   . Subdural hematoma ([facility])    small after fall 09/2016-plavix held, neurosurgery consulted.  Asymptomatic.     Past Surgical History:  Procedure Laterality Date  . ARM SKIN LESION BIOPSY / EXCISION Left 09/02/2015  . CATARACT EXTRACTION W/ INTRAOCULAR LENS  IMPLANT, BILATERAL Bilateral   . CHOLECYSTECTOMY OPEN    . CORNEAL TRANSPLANT Bilateral    "one at [facility]; one at [facility]"  . CORONARY ANGIOPLASTY WITH STENT PLACEMENT    . CORONARY ARTERY BYPASS GRAFT  1999  . DESCENDING AORTIC ANEURYSM REPAIR W/ STENT     DE stent ostium into the right radial free graft at OM-, 02-2007  . ESOPHAGOGASTRODUODENOSCOPY N/A 11/09/2014   Procedure: ESOPHAGOGASTRODUODENOSCOPY (EGD);  Surgeon: [name], MD;  Location: [facility] ENDOSCOPY;  Service: Endoscopy;  Laterality: N/A;  . INGUINAL HERNIA REPAIR    . JOINT REPLACEMENT    . LEFT HEART CATH AND CORS/GRAFTS ANGIOGRAPHY N/A 06/27/2017    Procedure: LEFT HEART CATH AND CORS/GRAFTS ANGIOGRAPHY;  Surgeon: [name], MD;  Location: [place] CV LAB;  Service: Cardiovascular;  Laterality: N/A;  . NASAL SINUS SURGERY    . SHOULDER OPEN ROTATOR CUFF REPAIR Right   . TOTAL KNEE ARTHROPLASTY Bilateral    Current Outpatient Medications on File Prior to Visit  Medication Sig Dispense Refill  . Cholecalciferol 2000 units TABS Take 1 tablet (2,000 Units total) by mouth daily. 30 each 11  . enoxaparin (LOVENOX) 80 MG/0.8ML injection Inject 0.8 mLs (80 mg total) into the skin every 12 (twelve) hours. 60 Syringe 0  . ENSURE (ENSURE) Take 237 mLs by mouth 2 (two) times daily between meals.    Marland Kitchen FLUoxetine (PROZAC) 20 MG capsule TAKE ONE CAPSULE BY MOUTH DAILY 90 capsule 3  .  furosemide (LASIX) 20 MG tablet Take 1 tablet (20 mg total) once a day. Take an extra tablet (20 mg total) for increased swelling, weight gain, or shortness of breath. 180 tablet 3  . ipratropium-albuterol (DUONEB) 0.5-2.5 (3) MG/3ML SOLN Take 3 mLs by nebulization every 4 (four) hours as needed (or Chest Pain / Tightness). 360 mL 3  . leflunomide (ARAVA) 10 MG tablet Take 1 tablet (10 mg total) by mouth daily. 30 tablet 1  . Lifitegrast (XIIDRA) 5 % SOLN Place 1-2 drops into both eyes daily.     Marland Kitchen LORazepam (ATIVAN) 1 MG tablet TAKE ONE TABLET BY MOUTH EVERY EIGHT HOURS AS NEEDED FOR ANXIETY 90 tablet 0  . magnesium oxide (MAG-OX) 400 (241.3 Mg) MG tablet Take 1 tablet (400 mg total) by mouth 2 (two) times daily. 60 tablet 0  . metoprolol succinate (TOPROL-XL) 25 MG 24 hr tablet TAKE ONE (1) TABLET BY MOUTH EVERY DAY 30 tablet 10  . nitroGLYCERIN (NITROSTAT) 0.4 MG SL tablet Place 1 tablet (0.4 mg total) under the tongue every 5 (five) minutes as needed for chest pain. 25 tablet 2  . pantoprazole (PROTONIX) 40 MG tablet Take one (1) tablet (40 mg) by mouth daily. 90 tablet 3  . rosuvastatin (CRESTOR) 5 MG tablet TAKE 5 mg  EVERY DAY 90 tablet 3  . silver  sulfADIAZINE (SILVADENE) 1 % cream Apply 1 application topically daily. 50 g 0  . tamsulosin (FLOMAX) 0.4 MG CAPS capsule TAKE 1 CAPSULE EVERY DAY 90 capsule 3   No current facility-administered medications on file prior to visit.    Allergies  Allergen Reactions  . Feldene [Piroxicam] Other (See Comments)    REACTION: Blisters  . Statins Other (See Comments)    Myalgias  . Doxycycline Other (See Comments)    REACTION:  unknown  . Latex Rash  . Tequin Anxiety  . Valium Other (See Comments)    REACTION:  unknown   Social History   Socioeconomic History  . Marital status: Widowed    Spouse name: Not on file  . Number of children: Not on file  . Years of education: Not on file  . Highest education level: Not on file  Occupational History  . Not on file  Social Needs  . Financial resource strain: Not on file  . Food insecurity:    Worry: Not on file    Inability: Not on file  . Transportation needs:    Medical: Not on file    Non-medical: Not on file  Tobacco Use  . Smoking status: Never Smoker  . Smokeless tobacco: Former Systems developer    Types: Chew  . Tobacco comment: "quit chewing in the 1960s"  Substance and Sexual Activity  . Alcohol use: No  . Drug use: No  . Sexual activity: Never  Lifestyle  . Physical activity:    Days per week: Not on file    Minutes per session: Not on file  . Stress: Not on file  Relationships  . Social connections:    Talks on phone: Not on file    Gets together: Not on file    Attends religious service: Not on file    Active member of club or organization: Not on file    Attends meetings of clubs or organizations: Not on file    Relationship status: Not on file  . Intimate partner violence:    Fear of current or ex partner: Not on file    Emotionally abused: Not on  file    Physically abused: Not on file    Forced sexual activity: Not on file  Other Topics Concern  . Not on file  Social History Narrative   01-05-18 Unable to ask  abuse questions family with him today.   11-012-19 Unable to ask abuse questions family with him today.      Review of Systems  All other systems reviewed and are negative.      Objective:   Physical Exam Vitals signs reviewed.  Constitutional:      General: He is not in acute distress.    Appearance: He is well-developed. He is not diaphoretic.  Cardiovascular:     Rate and Rhythm: Normal rate and regular rhythm.     Heart sounds: Normal heart sounds. No murmur.  Pulmonary:     Effort: Pulmonary effort is normal. No respiratory distress.     Breath sounds: No wheezing or rales.  Chest:     Chest wall: No tenderness.           Assessment & Plan:  Thumb laceration, right, sequela  Wound on the right thumb has healed.  No further work-up is necessary.  Discontinue dressing changes.  I am fine trying to wean down on Prozac to 10 mg a day and then monitor the patient to see if the nightmares improve.  I believe most likely this is due to increased social stress at home given the situation with his son.  If the nightmares worsen, we could consider prazosin although I would like to keep medications to a minimum given his advanced age

## 2018-03-19 DIAGNOSIS — J449 Chronic obstructive pulmonary disease, unspecified: Secondary | ICD-10-CM | POA: Diagnosis not present

## 2018-03-22 DIAGNOSIS — J449 Chronic obstructive pulmonary disease, unspecified: Secondary | ICD-10-CM | POA: Diagnosis not present

## 2018-03-30 ENCOUNTER — Other Ambulatory Visit: Payer: Self-pay | Admitting: Family Medicine

## 2018-04-02 ENCOUNTER — Other Ambulatory Visit: Payer: Self-pay | Admitting: Family Medicine

## 2018-04-02 MED ORDER — ENOXAPARIN SODIUM 80 MG/0.8ML ~~LOC~~ SOLN: 80.0000 mg | Freq: Two times a day (BID) | SUBCUTANEOUS | 3 refills | Status: DC

## 2018-04-02 MED ORDER — MAGNESIUM OXIDE 400 (241.3 MG) MG PO TABS: 400.0000 mg | ORAL_TABLET | Freq: Two times a day (BID) | ORAL | 3 refills | Status: DC

## 2018-04-02 MED ORDER — METOPROLOL SUCCINATE ER 25 MG PO TB24: ORAL_TABLET | ORAL | 3 refills | Status: DC

## 2018-04-02 NOTE — Telephone Encounter (Signed)
Requesting refill    Lorazepam    LOV:03/15/18  LRF: 02/28/18

## 2018-04-02 NOTE — Telephone Encounter (Signed)
Pt needs refill on lovenox, metoprolol, lorazepam, and magnesium oxide to [facility].

## 2018-04-03 MED ORDER — LORAZEPAM 1 MG PO TABS: 1.0000 mg | ORAL_TABLET | Freq: Three times a day (TID) | ORAL | 0 refills | Status: DC | PRN

## 2018-04-04 DIAGNOSIS — J449 Chronic obstructive pulmonary disease, unspecified: Secondary | ICD-10-CM | POA: Diagnosis not present

## 2018-04-05 DIAGNOSIS — J449 Chronic obstructive pulmonary disease, unspecified: Secondary | ICD-10-CM | POA: Diagnosis not present

## 2018-04-10 DIAGNOSIS — J449 Chronic obstructive pulmonary disease, unspecified: Secondary | ICD-10-CM | POA: Diagnosis not present

## 2018-04-12 DIAGNOSIS — J449 Chronic obstructive pulmonary disease, unspecified: Secondary | ICD-10-CM | POA: Diagnosis not present

## 2018-04-16 ENCOUNTER — Encounter: Payer: Self-pay | Admitting: Family Medicine

## 2018-04-16 ENCOUNTER — Ambulatory Visit (INDEPENDENT_AMBULATORY_CARE_PROVIDER_SITE_OTHER): Payer: Medicare HMO | Admitting: Family Medicine

## 2018-04-16 VITALS — BP 118/70 | HR 70 | Temp 97.4°F | Resp 14 | Ht 68.0 in | Wt 178.0 lb

## 2018-04-16 DIAGNOSIS — L2089 Other atopic dermatitis: Secondary | ICD-10-CM | POA: Diagnosis not present

## 2018-04-16 MED ORDER — CLOBETASOL PROPIONATE 0.05 % EX OINT: 1.0000 "application " | TOPICAL_OINTMENT | Freq: Two times a day (BID) | CUTANEOUS | 0 refills | Status: DC

## 2018-04-16 NOTE — Progress Notes (Signed)
Subjective:    Patient ID: Evan [name], male    DOB: [birth year], 85 y.o.   MRN: [id number]  HPI Patient reports a one-week history of an extremely itchy rash on the volar surface of his right antecubital fossa.  There is a maculopapular rash in this area consistent of pink urticarial-like papules and plaques and dry excoriated red skin extending from below the antecubital fossa up to the mid bicep area.  There are numerous excoriations and actually a linear superficial skin tear where he is scratched repeatedly and traumatize the skin.  Rash looks consistent with atopic dermatitis/eczema Past Medical History:  Diagnosis Date  . Adenocarcinoma of right lung ([place]) 01/06/2016  . Anginal chest pain at rest [facility])    Chronic non, controlled on Lorazepam  . Anxiety   . Arthritis    "all over"   . BPH (benign prostatic hyperplasia)   . CAD (coronary artery disease)   . Chronic lower back pain   . Complication of anesthesia    "problems making water afterwards"  . Depression   . DJD (degenerative joint disease)   . Esophageal ulcer without bleeding    bid ppi indefinitely  . Family history of adverse reaction to anesthesia    "children w/PONV"  . GERD (gastroesophageal reflux disease)   . Headache    "couple/week maybe" (09/04/2015)  . Hemochromatosis    Possible, elevated iron stores, hook-like osteophytes on hand films, normal LFTs  . Hepatitis    "yellow jaundice as a baby"  . History of ASCVD    MULTIVESSEL  . Hyperlipidemia   . Hypertension   . Myocardial infarction ([place]) 1999  . Osteoarthritis   . Pneumonia    "several times; got a little now" (09/04/2015)  . Rheumatoid arthritis ([place])   . Subdural hematoma ([facility])    small after fall 09/2016-plavix held, neurosurgery consulted.  Asymptomatic.     Past Surgical History:  Procedure Laterality Date  . ARM SKIN LESION BIOPSY / EXCISION Left 09/02/2015  . CATARACT EXTRACTION W/ INTRAOCULAR LENS  IMPLANT, BILATERAL Bilateral   .  CHOLECYSTECTOMY OPEN    . CORNEAL TRANSPLANT Bilateral    "one at [facility]; one at [facility]"  . CORONARY ANGIOPLASTY WITH STENT PLACEMENT    . CORONARY ARTERY BYPASS GRAFT  1999  . DESCENDING AORTIC ANEURYSM REPAIR W/ STENT     DE stent ostium into the right radial free graft at OM-, 02-2007  . ESOPHAGOGASTRODUODENOSCOPY N/A 11/09/2014   Procedure: ESOPHAGOGASTRODUODENOSCOPY (EGD);  Surgeon: [name], MD;  Location: [facility] ENDOSCOPY;  Service: Endoscopy;  Laterality: N/A;  . INGUINAL HERNIA REPAIR    . JOINT REPLACEMENT    . LEFT HEART CATH AND CORS/GRAFTS ANGIOGRAPHY N/A 06/27/2017   Procedure: LEFT HEART CATH AND CORS/GRAFTS ANGIOGRAPHY;  Surgeon: [name], MD;  Location: [place] CV LAB;  Service: Cardiovascular;  Laterality: N/A;  . NASAL SINUS SURGERY    . SHOULDER OPEN ROTATOR CUFF REPAIR Right   . TOTAL KNEE ARTHROPLASTY Bilateral    Current Outpatient Medications on File Prior to Visit  Medication Sig Dispense Refill  . Cholecalciferol 2000 units TABS Take 1 tablet (2,000 Units total) by mouth daily. 30 each 11  . enoxaparin (LOVENOX) 80 MG/0.8ML injection Inject 0.8 mLs (80 mg total) into the skin every 12 (twelve) hours. 60 Syringe 3  . ENSURE (ENSURE) Take 237 mLs by mouth 2 (two) times daily between meals.    Marland Kitchen FLUoxetine (PROZAC) 10 MG capsule Take 1 capsule (  10 mg total) by mouth daily. 30 capsule 3  . furosemide (LASIX) 20 MG tablet Take 1 tablet (20 mg total) once a day. Take an extra tablet (20 mg total) for increased swelling, weight gain, or shortness of breath. 180 tablet 3  . ipratropium-albuterol (DUONEB) 0.5-2.5 (3) MG/3ML SOLN Take 3 mLs by nebulization every 4 (four) hours as needed (or Chest Pain / Tightness). 360 mL 3  . leflunomide (ARAVA) 10 MG tablet TAKE 1 TABLET (10 MG TOTAL) BY MOUTH DAILY. 30 tablet 1  . Lifitegrast (XIIDRA) 5 % SOLN Place 1-2 drops into both eyes daily.     Marland Kitchen LORazepam (ATIVAN) 1 MG tablet Take 1 tablet (1 mg total) by mouth every 8 (eight)  hours as needed for anxiety. 90 tablet 0  . magnesium oxide (MAG-OX) 400 (241.3 Mg) MG tablet Take 1 tablet (400 mg total) by mouth 2 (two) times daily. 180 tablet 3  . metoprolol succinate (TOPROL-XL) 25 MG 24 hr tablet TAKE ONE (1) TABLET BY MOUTH EVERY DAY 90 tablet 3  . nitroGLYCERIN (NITROSTAT) 0.4 MG SL tablet Place 1 tablet (0.4 mg total) under the tongue every 5 (five) minutes as needed for chest pain. 25 tablet 2  . pantoprazole (PROTONIX) 40 MG tablet Take one (1) tablet (40 mg) by mouth daily. 90 tablet 3  . rosuvastatin (CRESTOR) 5 MG tablet TAKE 5 mg  EVERY DAY 90 tablet 3  . silver sulfADIAZINE (SILVADENE) 1 % cream Apply 1 application topically daily. 50 g 0  . tamsulosin (FLOMAX) 0.4 MG CAPS capsule TAKE 1 CAPSULE EVERY DAY 90 capsule 3   No current facility-administered medications on file prior to visit.    Allergies  Allergen Reactions  . Feldene [Piroxicam] Other (See Comments)    REACTION: Blisters  . Statins Other (See Comments)    Myalgias  . Doxycycline Other (See Comments)    REACTION:  unknown  . Latex Rash  . Tequin Anxiety  . Valium Other (See Comments)    REACTION:  unknown   Social History   Socioeconomic History  . Marital status: Widowed    Spouse name: Not on file  . Number of children: Not on file  . Years of education: Not on file  . Highest education level: Not on file  Occupational History  . Not on file  Social Needs  . Financial resource strain: Not on file  . Food insecurity:    Worry: Not on file    Inability: Not on file  . Transportation needs:    Medical: Not on file    Non-medical: Not on file  Tobacco Use  . Smoking status: Never Smoker  . Smokeless tobacco: Former Systems developer    Types: Chew  . Tobacco comment: "quit chewing in the 1960s"  Substance and Sexual Activity  . Alcohol use: No  . Drug use: No  . Sexual activity: Never  Lifestyle  . Physical activity:    Days per week: Not on file    Minutes per session: Not on  file  . Stress: Not on file  Relationships  . Social connections:    Talks on phone: Not on file    Gets together: Not on file    Attends religious service: Not on file    Active member of club or organization: Not on file    Attends meetings of clubs or organizations: Not on file    Relationship status: Not on file  . Intimate partner violence:  Fear of current or ex partner: Not on file    Emotionally abused: Not on file    Physically abused: Not on file    Forced sexual activity: Not on file  Other Topics Concern  . Not on file  Social History Narrative   01-05-18 Unable to ask abuse questions family with him today.   11-012-19 Unable to ask abuse questions family with him today.      Review of Systems  All other systems reviewed and are negative.      Objective:   Physical Exam Vitals signs reviewed.  Constitutional:      Appearance: Normal appearance.  Cardiovascular:     Rate and Rhythm: Normal rate and regular rhythm.     Heart sounds: Normal heart sounds.  Pulmonary:     Effort: Pulmonary effort is normal.     Breath sounds: Normal breath sounds.  Skin:    Findings: Abrasion and rash present. Rash is papular and urticarial.       Neurological:     Mental Status: He is alert.           Assessment & Plan:  Atopic dermatitis/eczema  Will use temovate 0.05% ointment twice daily to the affected area for 1 week and then switch to a moisturizer cream to prevent in the future.  Encourage the patient not to scratch the area further due to the trauma he is inducing and the risk of infection.

## 2018-04-17 DIAGNOSIS — J449 Chronic obstructive pulmonary disease, unspecified: Secondary | ICD-10-CM | POA: Diagnosis not present

## 2018-04-18 DIAGNOSIS — M064 Inflammatory polyarthropathy: Secondary | ICD-10-CM | POA: Diagnosis not present

## 2018-04-18 DIAGNOSIS — R21 Rash and other nonspecific skin eruption: Secondary | ICD-10-CM | POA: Diagnosis not present

## 2018-04-18 DIAGNOSIS — M159 Polyosteoarthritis, unspecified: Secondary | ICD-10-CM | POA: Diagnosis not present

## 2018-04-18 DIAGNOSIS — M810 Age-related osteoporosis without current pathological fracture: Secondary | ICD-10-CM | POA: Diagnosis not present

## 2018-04-18 DIAGNOSIS — Z889 Allergy status to unspecified drugs, medicaments and biological substances status: Secondary | ICD-10-CM | POA: Diagnosis not present

## 2018-04-18 DIAGNOSIS — M353 Polymyalgia rheumatica: Secondary | ICD-10-CM | POA: Diagnosis not present

## 2018-04-18 DIAGNOSIS — Z79899 Other long term (current) drug therapy: Secondary | ICD-10-CM | POA: Diagnosis not present

## 2018-04-19 DIAGNOSIS — J449 Chronic obstructive pulmonary disease, unspecified: Secondary | ICD-10-CM | POA: Diagnosis not present

## 2018-04-20 ENCOUNTER — Ambulatory Visit (INDEPENDENT_AMBULATORY_CARE_PROVIDER_SITE_OTHER): Payer: Medicare HMO | Admitting: Family Medicine

## 2018-04-20 ENCOUNTER — Encounter: Payer: Self-pay | Admitting: Family Medicine

## 2018-04-20 VITALS — BP 108/62 | HR 50 | Temp 98.2°F | Resp 16 | Ht 68.0 in | Wt 176.0 lb

## 2018-04-20 DIAGNOSIS — L299 Pruritus, unspecified: Secondary | ICD-10-CM

## 2018-04-20 LAB — COMPLETE METABOLIC PANEL WITH GFR
AG Ratio: 1.9 (calc) (ref 1.0–2.5)
ALT: 12 U/L (ref 9–46)
AST: 21 U/L (ref 10–35)
Albumin: 4.2 g/dL (ref 3.6–5.1)
Alkaline phosphatase (APISO): 107 U/L (ref 40–115)
BUN/Creatinine Ratio: 19 (calc) (ref 6–22)
BUN: 25 mg/dL (ref 7–25)
CO2: 18 mmol/L — ABNORMAL LOW (ref 20–32)
Calcium: 9.5 mg/dL (ref 8.6–10.3)
Chloride: 106 mmol/L (ref 98–110)
Creat: 1.35 mg/dL — ABNORMAL HIGH (ref 0.70–1.11)
GFR, Est African American: 54 mL/min/{1.73_m2} — ABNORMAL LOW (ref >=60)
GFR, Est Non African American: 47 mL/min/{1.73_m2} — ABNORMAL LOW (ref >=60)
Globulin: 2.2 g/dL (calc) (ref 1.9–3.7)
Glucose, Bld: 109 mg/dL — ABNORMAL HIGH (ref 65–99)
Potassium: 4.3 mmol/L (ref 3.5–5.3)
Sodium: 138 mmol/L (ref 135–146)
Total Bilirubin: 0.7 mg/dL (ref 0.2–1.2)
Total Protein: 6.4 g/dL (ref 6.1–8.1)

## 2018-04-20 LAB — CBC WITH DIFFERENTIAL/PLATELET
Absolute Monocytes: 775 cells/uL (ref 200–950)
Basophils Absolute: 41 cells/uL (ref 0–200)
Basophils Relative: 0.8 %
Eosinophils Absolute: 219 cells/uL (ref 15–500)
Eosinophils Relative: 4.3 %
HCT: 39.2 % (ref 38.5–50.0)
Hemoglobin: 13.1 g/dL — ABNORMAL LOW (ref 13.2–17.1)
Lymphs Abs: 1158 cells/uL (ref 850–3900)
MCH: 31.4 pg (ref 27.0–33.0)
MCHC: 33.4 g/dL (ref 32.0–36.0)
MCV: 94 fL (ref 80.0–100.0)
MPV: 13.5 fL — ABNORMAL HIGH (ref 7.5–12.5)
Monocytes Relative: 15.2 %
Neutro Abs: 2907 cells/uL (ref 1500–7800)
Neutrophils Relative %: 57 %
Platelets: 100 10*3/uL — ABNORMAL LOW (ref 140–400)
RBC: 4.17 10*6/uL — ABNORMAL LOW (ref 4.20–5.80)
RDW: 12.2 % (ref 11.0–15.0)
Total Lymphocyte: 22.7 %
WBC: 5.1 10*3/uL (ref 3.8–10.8)

## 2018-04-20 MED ORDER — CLOBETASOL PROPIONATE 0.05 % EX CREA: 1.0000 "application " | TOPICAL_CREAM | Freq: Two times a day (BID) | CUTANEOUS | 0 refills | Status: DC

## 2018-04-20 MED ORDER — GABAPENTIN 100 MG PO CAPS: 100.0000 mg | ORAL_CAPSULE | Freq: Three times a day (TID) | ORAL | 3 refills | Status: DC

## 2018-04-20 NOTE — Progress Notes (Signed)
Subjective:    Patient ID: Evan [name], male    DOB: 1928-03-04, 85 y.o.   MRN: [id number]  HPI  04/16/18 Patient reports a one-week history of an extremely itchy rash on the volar surface of his right antecubital fossa.  There is a maculopapular rash in this area consistent of pink urticarial-like papules and plaques and dry excoriated red skin extending from below the antecubital fossa up to the mid bicep area.  There are numerous excoriations and actually a linear superficial skin tear where he is scratched repeatedly and traumatize the skin.  Rash looks consistent with atopic dermatitis/eczema.  At that time, my plan was: Will use temovate 0.05% ointment twice daily to the affected area for 1 week and then switch to a moisturizer cream to prevent in the future.  Encourage the patient not to scratch the area further due to the trauma he is inducing and the risk of infection.  04/20/18 Patient is now complaining of itching all over his body.  He is complaining of itching on his right lower jaw.  He is complaining of itching on the back of his neck.  He is complaining of itching on the posterior aspect of his left shoulder as well as his right shoulder.  There is no visible rash on his upper back.  There is no visible rash on his right mandible.  The erythematous maculopapular rash in the right flexor crease has improved approximately 30% since Monday on the steroid cream however he continues to complain of itching.  Itching is out of proportion to the visible findings on the skin.  Therefore I believe is likely neuropathic in nature.  Patient had a similar reaction about 2 years ago.  There is no evidence of jaundice on his exam. Past Medical History:  Diagnosis Date  . Adenocarcinoma of right lung ([place]) 01/06/2016  . Anginal chest pain at rest [facility])    Chronic non, controlled on Lorazepam  . Anxiety   . Arthritis    "all over"   . BPH (benign prostatic hyperplasia)   . CAD (coronary artery  disease)   . Chronic lower back pain   . Complication of anesthesia    "problems making water afterwards"  . Depression   . DJD (degenerative joint disease)   . Esophageal ulcer without bleeding    bid ppi indefinitely  . Family history of adverse reaction to anesthesia    "children w/PONV"  . GERD (gastroesophageal reflux disease)   . Headache    "couple/week maybe" (09/04/2015)  . Hemochromatosis    Possible, elevated iron stores, hook-like osteophytes on hand films, normal LFTs  . Hepatitis    "yellow jaundice as a baby"  . History of ASCVD    MULTIVESSEL  . Hyperlipidemia   . Hypertension   . Myocardial infarction ([place]) 1999  . Osteoarthritis   . Pneumonia    "several times; got a little now" (09/04/2015)  . Rheumatoid arthritis ([place])   . Subdural hematoma ([facility])    small after fall 09/2016-plavix held, neurosurgery consulted.  Asymptomatic.     Past Surgical History:  Procedure Laterality Date  . ARM SKIN LESION BIOPSY / EXCISION Left 09/02/2015  . CATARACT EXTRACTION W/ INTRAOCULAR LENS  IMPLANT, BILATERAL Bilateral   . CHOLECYSTECTOMY OPEN    . CORNEAL TRANSPLANT Bilateral    "one at [facility]; one at [facility]"  . CORONARY ANGIOPLASTY WITH STENT PLACEMENT    . CORONARY ARTERY BYPASS GRAFT  1999  . DESCENDING AORTIC  ANEURYSM REPAIR W/ STENT     DE stent ostium into the right radial free graft at OM-, 02-2007  . ESOPHAGOGASTRODUODENOSCOPY N/A 11/09/2014   Procedure: ESOPHAGOGASTRODUODENOSCOPY (EGD);  Surgeon: [name], MD;  Location: [facility] ENDOSCOPY;  Service: Endoscopy;  Laterality: N/A;  . INGUINAL HERNIA REPAIR    . JOINT REPLACEMENT    . LEFT HEART CATH AND CORS/GRAFTS ANGIOGRAPHY N/A 06/27/2017   Procedure: LEFT HEART CATH AND CORS/GRAFTS ANGIOGRAPHY;  Surgeon: [name], MD;  Location: [place] CV LAB;  Service: Cardiovascular;  Laterality: N/A;  . NASAL SINUS SURGERY    . SHOULDER OPEN ROTATOR CUFF REPAIR Right   . TOTAL KNEE ARTHROPLASTY Bilateral    Current  Outpatient Medications on File Prior to Visit  Medication Sig Dispense Refill  . Cholecalciferol 2000 units TABS Take 1 tablet (2,000 Units total) by mouth daily. 30 each 11  . clobetasol ointment (TEMOVATE) 0.62 % Apply 1 application topically 2 (two) times daily. 30 g 0  . enoxaparin (LOVENOX) 80 MG/0.8ML injection Inject 0.8 mLs (80 mg total) into the skin every 12 (twelve) hours. 60 Syringe 3  . ENSURE (ENSURE) Take 237 mLs by mouth 2 (two) times daily between meals.    Marland Kitchen FLUoxetine (PROZAC) 10 MG capsule Take 1 capsule (10 mg total) by mouth daily. 30 capsule 3  . furosemide (LASIX) 20 MG tablet Take 1 tablet (20 mg total) once a day. Take an extra tablet (20 mg total) for increased swelling, weight gain, or shortness of breath. 180 tablet 3  . ipratropium-albuterol (DUONEB) 0.5-2.5 (3) MG/3ML SOLN Take 3 mLs by nebulization every 4 (four) hours as needed (or Chest Pain / Tightness). 360 mL 3  . leflunomide (ARAVA) 10 MG tablet TAKE 1 TABLET (10 MG TOTAL) BY MOUTH DAILY. 30 tablet 1  . Lifitegrast (XIIDRA) 5 % SOLN Place 1-2 drops into both eyes daily.     Marland Kitchen LORazepam (ATIVAN) 1 MG tablet Take 1 tablet (1 mg total) by mouth every 8 (eight) hours as needed for anxiety. 90 tablet 0  . magnesium oxide (MAG-OX) 400 (241.3 Mg) MG tablet Take 1 tablet (400 mg total) by mouth 2 (two) times daily. 180 tablet 3  . metoprolol succinate (TOPROL-XL) 25 MG 24 hr tablet TAKE ONE (1) TABLET BY MOUTH EVERY DAY 90 tablet 3  . nitroGLYCERIN (NITROSTAT) 0.4 MG SL tablet Place 1 tablet (0.4 mg total) under the tongue every 5 (five) minutes as needed for chest pain. 25 tablet 2  . pantoprazole (PROTONIX) 40 MG tablet Take one (1) tablet (40 mg) by mouth daily. 90 tablet 3  . rosuvastatin (CRESTOR) 5 MG tablet TAKE 5 mg  EVERY DAY 90 tablet 3  . silver sulfADIAZINE (SILVADENE) 1 % cream Apply 1 application topically daily. 50 g 0  . tamsulosin (FLOMAX) 0.4 MG CAPS capsule TAKE 1 CAPSULE EVERY DAY 90 capsule 3    No current facility-administered medications on file prior to visit.    Allergies  Allergen Reactions  . Feldene [Piroxicam] Other (See Comments)    REACTION: Blisters  . Statins Other (See Comments)    Myalgias  . Doxycycline Other (See Comments)    REACTION:  unknown  . Latex Rash  . Tequin Anxiety  . Valium Other (See Comments)    REACTION:  unknown   Social History   Socioeconomic History  . Marital status: Widowed    Spouse name: Not on file  . Number of children: Not on file  . Years  of education: Not on file  . Highest education level: Not on file  Occupational History  . Not on file  Social Needs  . Financial resource strain: Not on file  . Food insecurity:    Worry: Not on file    Inability: Not on file  . Transportation needs:    Medical: Not on file    Non-medical: Not on file  Tobacco Use  . Smoking status: Never Smoker  . Smokeless tobacco: Former Systems developer    Types: Chew  . Tobacco comment: "quit chewing in the 1960s"  Substance and Sexual Activity  . Alcohol use: No  . Drug use: No  . Sexual activity: Never  Lifestyle  . Physical activity:    Days per week: Not on file    Minutes per session: Not on file  . Stress: Not on file  Relationships  . Social connections:    Talks on phone: Not on file    Gets together: Not on file    Attends religious service: Not on file    Active member of club or organization: Not on file    Attends meetings of clubs or organizations: Not on file    Relationship status: Not on file  . Intimate partner violence:    Fear of current or ex partner: Not on file    Emotionally abused: Not on file    Physically abused: Not on file    Forced sexual activity: Not on file  Other Topics Concern  . Not on file  Social History Narrative   01-05-18 Unable to ask abuse questions family with him today.   11-012-19 Unable to ask abuse questions family with him today.      Review of Systems  All other systems reviewed and  are negative.      Objective:   Physical Exam Vitals signs reviewed.  Constitutional:      Appearance: Normal appearance.  Cardiovascular:     Rate and Rhythm: Normal rate and regular rhythm.     Heart sounds: Normal heart sounds.  Pulmonary:     Effort: Pulmonary effort is normal.     Breath sounds: Normal breath sounds.  Skin:    Findings: Abrasion and rash present. Rash is papular and urticarial.       Neurological:     Mental Status: He is alert.     Rash in the right flexor crease appears about 30% better although there is still erythematous papules and dry skin consistent with atopic dermatitis      Assessment & Plan:  I believe the patient has an eczema-like rash in his right elbow that is improving gradually on the Temovate.  However I believe that the diffuse itching all over his body is likely more neuropathic in nature.  There is no visible rash in any of the other locations however there are numerous excoriations where he is been scratching.  Therefore I believe we need to treat this from a neuropathic perspective.  I recommended starting gabapentin 100 mg.  I cautioned the patient and his family that this can cause dizziness.  Therefore I would start with just 1 tablet a day at night.  I would gradually increase the medication to 3 times a day if he tolerates it over a period of approximately 1 week.  Monitor for dizziness sedation or falls.  Patient's daughter will gradually increase the medication so that he can adjust slowly to it.  Continue the Temovate in his right elbow.  Use Vaseline or petroleum jelly in other places to try to help ease the itching.  Echo CBC to evaluate for any evidence of polycythemia.  Check a CMP to evaluate liver and kidney test

## 2018-04-23 ENCOUNTER — Telehealth: Payer: Self-pay | Admitting: Family Medicine

## 2018-04-23 DIAGNOSIS — L2089 Other atopic dermatitis: Secondary | ICD-10-CM

## 2018-04-23 MED ORDER — PREDNISONE 20 MG PO TABS: ORAL_TABLET | ORAL | 0 refills | Status: DC

## 2018-04-23 NOTE — Telephone Encounter (Signed)
Pt called and states that his rash is no better. Per [name] ok to do prednisone taper pack and referral to dermatology.  Med sent to pharm and referral placed.

## 2018-04-24 DIAGNOSIS — J449 Chronic obstructive pulmonary disease, unspecified: Secondary | ICD-10-CM | POA: Diagnosis not present

## 2018-04-26 DIAGNOSIS — J449 Chronic obstructive pulmonary disease, unspecified: Secondary | ICD-10-CM | POA: Diagnosis not present

## 2018-04-27 DIAGNOSIS — L309 Dermatitis, unspecified: Secondary | ICD-10-CM | POA: Diagnosis not present

## 2018-04-27 DIAGNOSIS — Z0189 Encounter for other specified special examinations: Secondary | ICD-10-CM | POA: Diagnosis not present

## 2018-04-27 DIAGNOSIS — L011 Impetiginization of other dermatoses: Secondary | ICD-10-CM | POA: Diagnosis not present

## 2018-04-30 ENCOUNTER — Ambulatory Visit (HOSPITAL_COMMUNITY): Payer: Medicare HMO | Attending: Internal Medicine

## 2018-04-30 DIAGNOSIS — I2699 Other pulmonary embolism without acute cor pulmonale: Secondary | ICD-10-CM | POA: Insufficient documentation

## 2018-04-30 MED ORDER — PERFLUTREN LIPID MICROSPHERE
1.0000 mL | INTRAVENOUS | Status: AC | PRN
  Administered 2018-04-30: 1 mL via INTRAVENOUS

## 2018-05-01 DIAGNOSIS — J449 Chronic obstructive pulmonary disease, unspecified: Secondary | ICD-10-CM | POA: Diagnosis not present

## 2018-05-03 DIAGNOSIS — J449 Chronic obstructive pulmonary disease, unspecified: Secondary | ICD-10-CM | POA: Diagnosis not present

## 2018-05-07 DIAGNOSIS — L308 Other specified dermatitis: Secondary | ICD-10-CM | POA: Diagnosis not present

## 2018-05-07 DIAGNOSIS — L011 Impetiginization of other dermatoses: Secondary | ICD-10-CM | POA: Diagnosis not present

## 2018-05-08 ENCOUNTER — Ambulatory Visit (INDEPENDENT_AMBULATORY_CARE_PROVIDER_SITE_OTHER): Payer: Medicare HMO | Admitting: Pulmonary Disease

## 2018-05-08 ENCOUNTER — Encounter: Payer: Self-pay | Admitting: Pulmonary Disease

## 2018-05-08 VITALS — BP 120/76 | HR 70 | Ht 65.0 in | Wt 179.0 lb

## 2018-05-08 DIAGNOSIS — J984 Other disorders of lung: Secondary | ICD-10-CM

## 2018-05-08 DIAGNOSIS — Z85118 Personal history of other malignant neoplasm of bronchus and lung: Secondary | ICD-10-CM

## 2018-05-08 DIAGNOSIS — I2699 Other pulmonary embolism without acute cor pulmonale: Secondary | ICD-10-CM | POA: Diagnosis not present

## 2018-05-08 DIAGNOSIS — C3490 Malignant neoplasm of unspecified part of unspecified bronchus or lung: Secondary | ICD-10-CM | POA: Diagnosis not present

## 2018-05-08 DIAGNOSIS — C3491 Malignant neoplasm of unspecified part of right bronchus or lung: Secondary | ICD-10-CM

## 2018-05-08 NOTE — Patient Instructions (Signed)
Large pulmonary embolism with some associated pulmonary hypertension: The echocardiogram showed that the pulmonary pressures have improved significantly Continue to exercise Keep taking Lovenox as directed by the oncology clinic We will repeat an echocardiogram in 6 months, if the pressures in your right heart are still elevated on that visit then we will consider further testing and medications  We will see you back in 6 months or sooner if needed

## 2018-05-08 NOTE — Addendum Note (Signed)
Addended by: Len Blalock on: 05/08/2018 11:02 AM   Modules accepted: Orders

## 2018-05-08 NOTE — Progress Notes (Signed)
Synopsis: Former patient of Dr. Ashok [name] with, lung cancer, history of DVT/PE  Subjective:    Patient ID: Evan [name], male    DOB: 12-31-1927, 85 y.o.   MRN: [id number]  HPI  Chief Complaint  Patient presents with  . Follow-up    pt states he is doing well, doing home PT and tolerating well.     Evan [name] says that his breathing has improved significantly Evan [name] since the last visit.  Specifically he says that he is able to walk further.  He has enjoyed working with physical therapy.  He has a little bit of skin bleeding related to Lovenox injections.  He is noted some mild leg swelling but in general his legs feel stronger and he has enjoyed exercising with Evan [name] from [place].  Past Medical History:  Diagnosis Date  . Adenocarcinoma of right lung ([place]) 01/06/2016  . [facility]inal chest pain at [facility])    Chronic non, controlled on Lorazepam  . Anxiety   . Arthritis    "all over"   . BPH (benign prostatic hyperplasia)   . CAD (coronary artery disease)   . Chronic lower back pain   . Complication of anesthesia    "problems making water afterwards"  . Depression   . DJD (degenerative joint disease)   . Esophageal ulcer without bleeding    bid ppi indefinitely  . Family history of adverse reaction to anesthesia    "children w/PONV"  . GERD (gastroesophageal reflux disease)   . Headache    "couple/week maybe" (09/04/2015)  . Hemochromatosis    Possible, elevated iron stores, hook-like osteophytes on hand films, normal LFTs  . Hepatitis    "yellow jaundice as a baby"  . History of ASCVD    MULTIVESSEL  . Hyperlipidemia   . Hypertension   . Myocardial infarction ([place]) 1999  . Osteoarthritis   . Pneumonia    "several times; got a little now" (09/04/2015)  . Rheumatoid arthritis ([place])   . Subdural hematoma ([facility])    small after fall 09/2016-plavix held, neurosurgery consulted.  Asymptomatic.       Review of Systems  Constitutional: Positive for fatigue. Negative for  diaphoresis and fever.  HENT: Negative for rhinorrhea, sinus pressure and sinus pain.   Respiratory: Positive for cough and chest tightness. Negative for shortness of breath.   Cardiovascular: Negative for chest pain, palpitations and leg swelling.       Objective:   Physical Exam Vitals:   05/08/18 0923  BP: 120/76  Pulse: 70  SpO2: 97%  Weight: 179 lb (81.2 kg)  Height: 5\' 5"  (1.651 m)    Gen: elderly male, chronically ill appearing HENT: OP clear, TM's clear, neck supple PULM: Crackles in bases B, normal percussion CV: RRR, no mgr, trace edema GI: BS+, soft, nontender Derm: no cyanosis or rash Psyche: normal mood and affect    PFT 01/05/16:FVC 3.26 L (111%) FEV1 2.61 L (131%) FEV1/FVC 0.80 FEF 25-75 2.60 L (214%) negative bronchodilator response TLC 5.47 L (90%) RV 86% ERV 401% DLCO corrected 46% 2018 Spirometry today show some mild restriction with FVC at 70%, FEV1 is 87%, ratio 85%  WALK TEST 10/11/16: Walked 1lap / Baseline Sat 100% on RA / Nadir Sat 100% on RA (stopped due to feeling his legs were weak)  IMAGING CT CHEST W/ CONTRAST 10/04/16 (personally reviewed by me):Trace left pleural effusion. Pleural thickening on the right noted. No pathologic mediastinal adenopathy. No pericardial effusion. Improvement in right basilar  density compared with CT imaging performed in February. There is also decreased subpleural patchy groundglass opacities as well. No new or developing areas of opacification or groundglass. Questionable subpleural reticulation, primarily on the right. No obvious honeycombing or bronchiectasis appreciated.  PET CT 12/22/15 (per radiologist):  IMPRESSION: 1. Hypermetabolic (max SUV 3.9) posterior right lower lobe predominantly solid 2.9 cm pulmonary nodule, which is increased in size back to 2013, most consistent with primary bronchogenic adenocarcinoma. 2. Patchy subpleural reticulation throughout both lungs, basilar predominant, suspicious  for interstitial lung disease, not well characterized on this study. Consider correlation with high-resolution chest CT in 6-12 months to assess temporal pattern stability, as clinically warranted. 3. Hypermetabolic bilateral hilar and mediastinal lymphadenopathy is highly nonspecific given the suspected underlying interstitial lung disease, and could be reactive or metastatic. 4. Nonspecific hypermetabolic right level 2 neck lymph node, favor reactive node, cannot exclude metastasis. 5. No hypermetabolic osseous or abdominopelvic metastatic disease.  6. Mild hypermetabolism associated with subcutaneous fat stranding in the lateral right hip soft tissues, probably benign, correlate with any history of recent trauma or procedure in this location. 7. Relatively symmetric hypermetabolism in the bilateral glenohumeral joints with associated osteoarthritis, favor degenerative uptake. 8. Additional findings include mild cardiomegaly, aortic atherosclerosis, coronary atherosclerosis status post CABG and moderate sigmoid diverticulosis.  February 02, 2018 CT angiogram chest images independently reviewed showing nonspecific interstitial changes (interlobular septal thickening, no honeycombing, trace pleural effusions, patchy groundglass, there is an area of consolidation in the right lower lobe, likely past treatment for adenocarcinoma with radiation treatment)  CARDIAC TTE (05/23/16):The normal in size with moderate concentric hypertrophy. EF 54-27%CWCB normal diastolic function. LA moderately to severely dilated with RA normal in size. RV normal in size and function. Mild aortic stenosis with moderately calcified leaflets. Mild mitral regurgitation. Trivial pulmonic regurgitation. Mild tricuspid regurgitation. 02/2018 TTE normal LVEF, RVSP 58 mmHg 04/2017 Echo: LVEF 60-65% RV enlarged, RVSP 37.6 mmHg, dilated left atrium, dilated right atrium  PATHOLOGY CT-GUIDED BIOPSY RIGHT LOWER LOBE MASS  (12/30/15):Adenocarcinoma  Records reviewed from his hospital discharge February 03, 2018 where he was seen for acute pulmonary embolism with a DVT in the left peroneal vein.  Changed from Eliquis to Lovenox.  Was noted to have missed some doses of Eliquis prior.  Records from visit with radiation oncology February 13, 2018 where he was seen for stage Ia adenocarcinoma of the right lower lung, most recent CT chest showed disease stability.     Assessment & Plan:    No diagnosis found.  Discussion: This has been a stable interval for Mr. Chavarin, overall his situation has improved as he is much stronger than he was when I last saw him.  His shortness of breath has improved significantly.  Objectively his pulmonary pressures have improved on the most recent echocardiogram.  They are not completely normal but I think that it is not unexpected to see some degree of elevated PA pressure just 2-1/2 months after the most recent pulmonary embolism.  Plan: Large pulmonary embolism with some associated pulmonary hypertension: The echocardiogram showed that the pulmonary pressures have improved significantly Continue to exercise Keep taking Lovenox as directed by the oncology clinic We will repeat an echocardiogram in 6 months, if the pressures in your right heart are still elevated on that visit then we will consider further testing and medications  Small airways disease related to prior tobacco abuse Duoneb prn  Lung cancer, in remission > f/u with oncology  We will see you back in  6 months or sooner if needed    Current Outpatient Medications:  .  Cholecalciferol 2000 units TABS, Take 1 tablet (2,000 Units total) by mouth daily., Disp: 30 each, Rfl: 11 .  clobetasol cream (TEMOVATE) 0.41 %, Apply 1 application topically 2 (two) times daily., Disp: 60 g, Rfl: 0 .  enoxaparin (LOVENOX) 80 MG/0.8ML injection, Inject 0.8 mLs (80 mg total) into the skin every 12 (twelve) hours., Disp: 60 Syringe,  Rfl: 3 .  ENSURE (ENSURE), Take 237 mLs by mouth 2 (two) times daily between meals., Disp: , Rfl:  .  FLUoxetine (PROZAC) 10 MG capsule, Take 1 capsule (10 mg total) by mouth daily., Disp: 30 capsule, Rfl: 3 .  furosemide (LASIX) 20 MG tablet, Take 1 tablet (20 mg total) once a day. Take an extra tablet (20 mg total) for increased swelling, weight gain, or shortness of breath., Disp: 180 tablet, Rfl: 3 .  ipratropium-albuterol (DUONEB) 0.5-2.5 (3) MG/3ML SOLN, Take 3 mLs by nebulization every 4 (four) hours as needed (or Chest Pain / Tightness)., Disp: 360 mL, Rfl: 3 .  Lifitegrast (XIIDRA) 5 % SOLN, Place 1-2 drops into both eyes daily. , Disp: , Rfl:  .  LORazepam (ATIVAN) 1 MG tablet, Take 1 tablet (1 mg total) by mouth every 8 (eight) hours as needed for anxiety., Disp: 90 tablet, Rfl: 0 .  magnesium oxide (MAG-OX) 400 (241.3 Mg) MG tablet, Take 1 tablet (400 mg total) by mouth 2 (two) times daily., Disp: 180 tablet, Rfl: 3 .  metoprolol succinate (TOPROL-XL) 25 MG 24 hr tablet, TAKE ONE (1) TABLET BY MOUTH EVERY DAY, Disp: 90 tablet, Rfl: 3 .  nitroGLYCERIN (NITROSTAT) 0.4 MG SL tablet, Place 1 tablet (0.4 mg total) under the tongue every 5 (five) minutes as needed for chest pain., Disp: 25 tablet, Rfl: 2 .  pantoprazole (PROTONIX) 40 MG tablet, Take one (1) tablet (40 mg) by mouth daily., Disp: 90 tablet, Rfl: 3 .  rosuvastatin (CRESTOR) 5 MG tablet, TAKE 5 mg  EVERY DAY, Disp: 90 tablet, Rfl: 3 .  tamsulosin (FLOMAX) 0.4 MG CAPS capsule, TAKE 1 CAPSULE EVERY DAY, Disp: 90 capsule, Rfl: 3

## 2018-05-10 DIAGNOSIS — J449 Chronic obstructive pulmonary disease, unspecified: Secondary | ICD-10-CM | POA: Diagnosis not present

## 2018-05-11 ENCOUNTER — Ambulatory Visit: Payer: Medicare HMO | Admitting: Family Medicine

## 2018-05-14 ENCOUNTER — Ambulatory Visit (INDEPENDENT_AMBULATORY_CARE_PROVIDER_SITE_OTHER): Payer: Medicare HMO | Admitting: Family Medicine

## 2018-05-14 VITALS — BP 116/64 | HR 58 | Temp 97.6°F | Resp 16 | Ht 68.0 in | Wt 181.0 lb

## 2018-05-14 DIAGNOSIS — L299 Pruritus, unspecified: Secondary | ICD-10-CM | POA: Diagnosis not present

## 2018-05-14 DIAGNOSIS — I2699 Other pulmonary embolism without acute cor pulmonale: Secondary | ICD-10-CM

## 2018-05-14 NOTE — Progress Notes (Signed)
Subjective:    Patient ID: Evan [name], male    DOB: Jul 10, 1927, 85 y.o.   MRN: [id number]  Illness   01/30/18 Patient walked in urgently this afternoon at 4:00 complaining of feeling weak.  Initially a temporal thermometer was used and his temperature was found to be 102.9!.  We repeated this with an oral thermometer and his temperature was found to be 98.8.  Therefore I believe the temperature was incorrect.  I repeated the oral temperature again 20 minutes later and it was 97.8.  Patient's blood pressure initially upon walking and was 98/60.  Therefore we started the patient on normal saline and he was given a 500 cc bolus.  Patient states that he was fine when he went to sleep.  When he awoke from his nap this afternoon, he felt extremely shaky and weak.  His legs felt weak.  His daughter brought him straight to the office.  He states that he has had a cough over the last week that is gradually gotten worse.  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  However he does report increasing cough and increasing weakness over the last week.  He denies any sore throat.  He denies any rhinorrhea.  He denies any otalgia.  He denies any nausea or vomiting.  He denies any chest pain.  He denies any shortness of breath above his baseline.  He denies any dysuria.  He denies any urgency or frequency.  Initial flu test was negative.  At that time, my plan was:  Exam today is unremarkable.  The initial temperature was incorrect.  Repeated oral temperatures have been in a normal range between 97.8 and 98.8.  Therefore I will disregard the initial temporal scan which said 102.9.  Patient's blood pressure has improved to 120/60 after receiving a 500 cc bolus of normal saline.  Flu test is negative.  Given the increasing cough and the crackles on exam I  will treat the patient for possible community-acquired pneumonia.  Patient was given 1 g of Rocephin IM x1 now and I will start him on a Z-Pak.  Patient will return tomorrow for reevaluation to ensure that he is in gradually improving.  CBC and CMP were sent.  Patient is instructed to go to the emergency room if symptoms worsen or change in any way.  02/05/18 Patient return the next day for follow-up.  Given worsening sided chest pain shortness of breath, he was recommended to go to the emergency room.  In the emergency room, CT scan of the chest revealed right for pulmonary embolism.  He was also found to have a left peroneal vein DVT.  He was admitted to the hospital and started on heparin and then transitioned to Lovenox 80 mg subcu twice daily for treatment of the pulmonary embolism.  It was recommended for lifelong anticoagulation given the fact the patient has a history of right-sided lung cancer and also formed DVT and pulmonary embolism while taking Eliquis.  He is here today for follow-up.  His daughter states that he may have missed 1 or 2 doses of Eliquis but he usually took it twice a day.  He was compliant more than 90% of the time.  He is reporting right-sided chest pain.  He is also reporting fatigue and shortness of breath and dyspnea on exertion.  Symptoms have not changed considerably from last week.  He denies any syncope.  He denies any palpitations.  At that time, my plan was: Given his history of lung cancer, and the fact that his DVT and pulmonary embolism formed while taking Eliquis, I recommended that the patient remain on Lovenox indefinitely.  He will discontinue Eliquis and use only Lovenox.  I would recommend 80 mg twice daily for at least the first 6 months.  Then to ensure compliance and for ease of administration, we may consider switching to once daily lovenox but use 1.5 mg/kg daily (120 mg).  I will follow-up lab studies as requested by the hospitalist including a CBC and  CMP.  02/09/18 Patient continues to endorse the same symptoms.  He reports fatigue.  He reports weakness.  He reports that his legs feel shaky.  He reports chest pain that is essentially been present over the last year and is essentially unchanged.  He reports vague shortness of breath.  Physical therapy has not yet coming to his home.  AT that time, my plan was: The majority of the patient's symptoms today are chronic issues he is complained of over the last few years including shortness of breath weakness, feeling shaky.  I believe that these chronic symptoms have recently been exacerbated by his DVT and pulmonary embolism.  I would recheck the patient in 3 months.  Continue Lovenox twice daily for 3 months and then switch to 1.5 mg/kg daily in 3 months.  Monitor MP today to check his renal function along with his magnesium and potassium.  Arrange for home health physical therapy  05/14/18 Patient is here today for follow-up.  He is completed 3 months of Lovenox therapy.  Therefore I would like to switch now to once daily Lovenox 1.5 mg/kg/day which would equal out 220 mg shots.  This would be much more convenient for the patient and the family as they are having to drive to his house twice a day to give him the shots.  Unfortunately he continues to suffer from diffuse itching.  Please see my last few office notes.  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  He is constantly scratching his skin.  He had a previous flare of this several years ago and was ultimately referred to a dermatologist.  Neither the patient nor the family can remember what finally cause the situation to improve.  Recently it started happening again.  He developed a eczema-like rash on his right upper extremity and the flexor crease which did not respond to topical steroids or oral steroids.  He was referred to dermatology who treated him for a secondary staph infection with Keflex.  The staph infection has improved but the  patient continues to scratch all over his body.  There are numerous excoriations and scabs on his back on his shoulders on his arms and on his legs were he is scratching.  Even today during our encounter, almost reflexively, the patient is scratching at his abdomen and his wrist.  Ironically this all started around the same time we wean down on his Prozac.  I believe anxiety could be contributing to a lot of this and exacerbating the neuropathic itch.  I believe some of this is reflux driven as well.  However he is seen no benefit from gabapentin.  He tried hydroxyzine in the past and not had any benefit with the itching.  I put him on prednisone.  Dermatology put him on 2 rounds of  prednisone with no improvement.  I question if this may be psychosomatic coupled with some underlying dry skin or possibly even neuropathic itching. Past Medical History:  Diagnosis Date  . Adenocarcinoma of right lung ([place]) 01/06/2016  . Anginal chest pain at rest [facility])    Chronic non, controlled on Lorazepam  . Anxiety   . Arthritis    "all over"   . BPH (benign prostatic hyperplasia)   . CAD (coronary artery disease)   . Chronic lower back pain   . Complication of anesthesia    "problems making water afterwards"  . Depression   . DJD (degenerative joint disease)   . Esophageal ulcer without bleeding    bid ppi indefinitely  . Family history of adverse reaction to anesthesia    "children w/PONV"  . GERD (gastroesophageal reflux disease)   . Headache    "couple/week maybe" (09/04/2015)  . Hemochromatosis    Possible, elevated iron stores, hook-like osteophytes on hand films, normal LFTs  . Hepatitis    "yellow jaundice as a baby"  . History of ASCVD    MULTIVESSEL  . Hyperlipidemia   . Hypertension   . Myocardial infarction ([place]) 1999  . Osteoarthritis   . Pneumonia    "several times; got a little now" (09/04/2015)  . Rheumatoid arthritis ([place])   . Subdural hematoma ([facility])    small after fall 09/2016-plavix  held, neurosurgery consulted.  Asymptomatic.     Past Surgical History:  Procedure Laterality Date  . ARM SKIN LESION BIOPSY / EXCISION Left 09/02/2015  . CATARACT EXTRACTION W/ INTRAOCULAR LENS  IMPLANT, BILATERAL Bilateral   . CHOLECYSTECTOMY OPEN    . CORNEAL TRANSPLANT Bilateral    "one at [facility]; one at [facility]"  . CORONARY ANGIOPLASTY WITH STENT PLACEMENT    . CORONARY ARTERY BYPASS GRAFT  1999  . DESCENDING AORTIC ANEURYSM REPAIR W/ STENT     DE stent ostium into the right radial free graft at OM-, 02-2007  . ESOPHAGOGASTRODUODENOSCOPY N/A 11/09/2014   Procedure: ESOPHAGOGASTRODUODENOSCOPY (EGD);  Surgeon: [name], MD;  Location: [facility] ENDOSCOPY;  Service: Endoscopy;  Laterality: N/A;  . INGUINAL HERNIA REPAIR    . JOINT REPLACEMENT    . LEFT HEART CATH AND CORS/GRAFTS ANGIOGRAPHY N/A 06/27/2017   Procedure: LEFT HEART CATH AND CORS/GRAFTS ANGIOGRAPHY;  Surgeon: [name], MD;  Location: [facility] CV LAB;  Service: Cardiovascular;  Laterality: N/A;  . NASAL SINUS SURGERY    . SHOULDER OPEN ROTATOR CUFF REPAIR Right   . TOTAL KNEE ARTHROPLASTY Bilateral    Current Outpatient Medications on File Prior to Visit  Medication Sig Dispense Refill  . cephALEXin (KEFLEX) 500 MG capsule     . Cholecalciferol 2000 units TABS Take 1 tablet (2,000 Units total) by mouth daily. 30 each 11  . clobetasol cream (TEMOVATE) 3.87 % Apply 1 application topically 2 (two) times daily. 60 g 0  . enoxaparin (LOVENOX) 80 MG/0.8ML injection Inject 0.8 mLs (80 mg total) into the skin every 12 (twelve) hours. 60 Syringe 3  . ENSURE (ENSURE) Take 237 mLs by mouth 2 (two) times daily between meals.    Marland Kitchen FLUoxetine (PROZAC) 10 MG capsule Take 1 capsule (10 mg total) by mouth daily. 30 capsule 3  . furosemide (LASIX) 20 MG tablet Take 1 tablet (20 mg total) once a day. Take an extra tablet (20 mg total) for increased swelling, weight gain, or shortness of breath. 180 tablet 3  . ipratropium-albuterol (DUONEB)  0.5-2.5 (3) MG/3ML SOLN  Take 3 mLs by nebulization every 4 (four) hours as needed (or Chest Pain / Tightness). 360 mL 3  . Lifitegrast (XIIDRA) 5 % SOLN Place 1-2 drops into both eyes daily.     Marland Kitchen LORazepam (ATIVAN) 1 MG tablet Take 1 tablet (1 mg total) by mouth every 8 (eight) hours as needed for anxiety. 90 tablet 0  . magnesium oxide (MAG-OX) 400 (241.3 Mg) MG tablet Take 1 tablet (400 mg total) by mouth 2 (two) times daily. 180 tablet 3  . metoprolol succinate (TOPROL-XL) 25 MG 24 hr tablet TAKE ONE (1) TABLET BY MOUTH EVERY DAY 90 tablet 3  . nitroGLYCERIN (NITROSTAT) 0.4 MG SL tablet Place 1 tablet (0.4 mg total) under the tongue every 5 (five) minutes as needed for chest pain. 25 tablet 2  . pantoprazole (PROTONIX) 40 MG tablet Take one (1) tablet (40 mg) by mouth daily. 90 tablet 3  . predniSONE (DELTASONE) 10 MG tablet     . rosuvastatin (CRESTOR) 5 MG tablet TAKE 5 mg  EVERY DAY 90 tablet 3  . tamsulosin (FLOMAX) 0.4 MG CAPS capsule TAKE 1 CAPSULE EVERY DAY 90 capsule 3   No current facility-administered medications on file prior to visit.    Allergies  Allergen Reactions  . Feldene [Piroxicam] Other (See Comments)    REACTION: Blisters  . Statins Other (See Comments)    Myalgias  . Doxycycline Other (See Comments)    REACTION:  unknown  . Latex Rash  . Tequin Anxiety  . Valium Other (See Comments)    REACTION:  unknown   Social History   Socioeconomic History  . Marital status: Widowed    Spouse name: Not on file  . Number of children: Not on file  . Years of education: Not on file  . Highest education level: Not on file  Occupational History  . Not on file  Social Needs  . Financial resource strain: Not on file  . Food insecurity:    Worry: Not on file    Inability: Not on file  . Transportation needs:    Medical: Not on file    Non-medical: Not on file  Tobacco Use  . Smoking status: Never Smoker  . Smokeless tobacco: Former Systems developer    Types: Chew  . Tobacco  comment: "quit chewing in the 1960s"  Substance and Sexual Activity  . Alcohol use: No  . Drug use: No  . Sexual activity: Never  Lifestyle  . Physical activity:    Days per week: Not on file    Minutes per session: Not on file  . Stress: Not on file  Relationships  . Social connections:    Talks on phone: Not on file    Gets together: Not on file    Attends religious service: Not on file    Active member of club or organization: Not on file    Attends meetings of clubs or organizations: Not on file    Relationship status: Not on file  . Intimate partner violence:    Fear of current or ex partner: Not on file    Emotionally abused: Not on file    Physically abused: Not on file    Forced sexual activity: Not on file  Other Topics Concern  . Not on file  Social History Narrative   01-05-18 Unable to ask abuse questions family with him today.   11-012-19 Unable to ask abuse questions family with him today.      Review of  Systems  All other systems reviewed and are negative.      Objective:   Physical Exam Vitals signs reviewed.  Constitutional:      General: He is not in acute distress.    Appearance: He is well-developed. He is not diaphoretic.  HENT:     Right Ear: External ear normal.     Left Ear: External ear normal.     Nose: Nose normal.     Mouth/Throat:     Pharynx: No oropharyngeal exudate.  Eyes:     Conjunctiva/sclera: Conjunctivae normal.  Neck:     Musculoskeletal: Neck supple.     Vascular: No JVD.  Cardiovascular:     Rate and Rhythm: Normal rate and regular rhythm.     Heart sounds: Normal heart sounds. No murmur.  Pulmonary:     Effort: Pulmonary effort is normal. No respiratory distress.     Breath sounds: No wheezing or rales.  Chest:     Chest wall: No tenderness.  Abdominal:     General: Bowel sounds are normal. There is no distension.     Palpations: Abdomen is soft.     Tenderness: There is no abdominal tenderness. There is no  guarding or rebound.  Lymphadenopathy:     Cervical: No cervical adenopathy.           Assessment & Plan:  Pruritus  Pulmonary embolism and infarction [facility])  Difficult case.  I am not sure the cause of the patient's pruritus.  He has seen dermatology now numerous times and has had no benefit in the itching.  I believe some of this may be anxiety driven and psychosomatic coupled with neuropathic itching.  I do believe it is not just a coincidence that this all started to happen once we wean down on his Prozac.  Therefore I have recommended trying to increase the Prozac back to 20 mg a day and using topical capsaicin cream for the itching in case this is neuropathic in nature especially given the fact that none of the topical steroid creams or even oral prednisone has helped.  Could consider increasing gabapentin to a higher dose but I am worried about polypharmacy and falls.  Hydroxyzine was not beneficial.  Could possibly consider amitriptyline.  Reassess in 2 weeks.  Patient will complete all of the prefilled Lovenox ranges he has now which is approximately 3 weeks and then we will switch the patient to 120 mg once a day of Lovenox indefinitely.

## 2018-05-15 DIAGNOSIS — J449 Chronic obstructive pulmonary disease, unspecified: Secondary | ICD-10-CM | POA: Diagnosis not present

## 2018-05-18 ENCOUNTER — Telehealth: Payer: Self-pay | Admitting: Interventional Cardiology

## 2018-05-18 NOTE — Telephone Encounter (Signed)
Pt's Daughter called and wanted to confirm if any blood work needs to be done at his next appt.    Can text Pt's Daughter

## 2018-05-18 NOTE — Telephone Encounter (Signed)
Called and spoke to daughter and made her aware that we have recent labs on file and if Dr. Irish Lack would like to order additional labs this will be determined at the [facility]. Daughter verbalized understanding and thanked me for the call.

## 2018-05-22 DIAGNOSIS — J449 Chronic obstructive pulmonary disease, unspecified: Secondary | ICD-10-CM | POA: Diagnosis not present

## 2018-05-24 DIAGNOSIS — J449 Chronic obstructive pulmonary disease, unspecified: Secondary | ICD-10-CM | POA: Diagnosis not present

## 2018-05-25 ENCOUNTER — Ambulatory Visit: Payer: Medicare HMO | Admitting: Interventional Cardiology

## 2018-05-25 ENCOUNTER — Telehealth: Payer: Self-pay | Admitting: Interventional Cardiology

## 2018-05-25 NOTE — Telephone Encounter (Signed)
New message:    Patient daughter calling  because his appt was cancel and she states they need a sooner appt

## 2018-05-25 NOTE — Telephone Encounter (Signed)
Called and spoke to daughter. Appointment made for 3/11 at 2:40 PM.

## 2018-05-28 ENCOUNTER — Telehealth: Payer: Self-pay | Admitting: Interventional Cardiology

## 2018-05-28 NOTE — Telephone Encounter (Signed)
Appointment rescheduled to 3/4.

## 2018-05-28 NOTE — Telephone Encounter (Signed)
New Message   Patient needs to talk to Evan [name] about getting an appointment on 3/4 as per their previous conversation she states.

## 2018-05-29 DIAGNOSIS — J449 Chronic obstructive pulmonary disease, unspecified: Secondary | ICD-10-CM | POA: Diagnosis not present

## 2018-05-30 ENCOUNTER — Telehealth: Payer: Self-pay | Admitting: Family Medicine

## 2018-05-30 DIAGNOSIS — L0109 Other impetigo: Secondary | ICD-10-CM | POA: Diagnosis not present

## 2018-05-30 DIAGNOSIS — L0101 Non-bullous impetigo: Secondary | ICD-10-CM | POA: Diagnosis not present

## 2018-05-30 NOTE — Telephone Encounter (Signed)
BillieJo called and states that you were going to change his Lovenox dose to once a day and she needs that called into the [facility].

## 2018-05-31 DIAGNOSIS — J449 Chronic obstructive pulmonary disease, unspecified: Secondary | ICD-10-CM | POA: Diagnosis not present

## 2018-05-31 MED ORDER — ENOXAPARIN SODIUM 120 MG/0.8ML ~~LOC~~ SOLN: 120.0000 mg | Freq: Every day | SUBCUTANEOUS | 11 refills | Status: DC

## 2018-05-31 NOTE — Telephone Encounter (Signed)
Switch Lovenox to 120 mg sq daily

## 2018-05-31 NOTE — Telephone Encounter (Signed)
Medication called/sent to requested pharmacy and BillieJo aware

## 2018-06-01 ENCOUNTER — Inpatient Hospital Stay (HOSPITAL_COMMUNITY)
Admission: EM | Admit: 2018-06-01 | Discharge: 2018-06-04 | DRG: 603 | Disposition: A | Discharge status: Home Health | Payer: Medicare HMO | Attending: Internal Medicine | Admitting: Internal Medicine

## 2018-06-01 ENCOUNTER — Other Ambulatory Visit: Payer: Self-pay

## 2018-06-01 ENCOUNTER — Encounter (HOSPITAL_COMMUNITY): Payer: Self-pay | Admitting: Emergency Medicine

## 2018-06-01 ENCOUNTER — Observation Stay (HOSPITAL_COMMUNITY): Payer: Medicare HMO

## 2018-06-01 DIAGNOSIS — L03113 Cellulitis of right upper limb: Principal | ICD-10-CM

## 2018-06-01 DIAGNOSIS — I482 Chronic atrial fibrillation, unspecified: Secondary | ICD-10-CM | POA: Diagnosis present

## 2018-06-01 DIAGNOSIS — D696 Thrombocytopenia, unspecified: Secondary | ICD-10-CM | POA: Diagnosis present

## 2018-06-01 DIAGNOSIS — R21 Rash and other nonspecific skin eruption: Secondary | ICD-10-CM | POA: Diagnosis present

## 2018-06-01 DIAGNOSIS — Z86711 Personal history of pulmonary embolism: Secondary | ICD-10-CM | POA: Diagnosis not present

## 2018-06-01 DIAGNOSIS — Z96653 Presence of artificial knee joint, bilateral: Secondary | ICD-10-CM | POA: Diagnosis present

## 2018-06-01 DIAGNOSIS — Z955 Presence of coronary angioplasty implant and graft: Secondary | ICD-10-CM

## 2018-06-01 DIAGNOSIS — Z87891 Personal history of nicotine dependence: Secondary | ICD-10-CM | POA: Diagnosis not present

## 2018-06-01 DIAGNOSIS — L309 Dermatitis, unspecified: Secondary | ICD-10-CM | POA: Diagnosis not present

## 2018-06-01 DIAGNOSIS — I480 Paroxysmal atrial fibrillation: Secondary | ICD-10-CM | POA: Diagnosis not present

## 2018-06-01 DIAGNOSIS — C3491 Malignant neoplasm of unspecified part of right bronchus or lung: Secondary | ICD-10-CM | POA: Diagnosis not present

## 2018-06-01 DIAGNOSIS — M069 Rheumatoid arthritis, unspecified: Secondary | ICD-10-CM | POA: Diagnosis present

## 2018-06-01 DIAGNOSIS — L299 Pruritus, unspecified: Secondary | ICD-10-CM | POA: Diagnosis present

## 2018-06-01 DIAGNOSIS — Z7901 Long term (current) use of anticoagulants: Secondary | ICD-10-CM | POA: Diagnosis not present

## 2018-06-01 DIAGNOSIS — I4821 Permanent atrial fibrillation: Secondary | ICD-10-CM | POA: Diagnosis present

## 2018-06-01 DIAGNOSIS — S5011XA Contusion of right forearm, initial encounter: Secondary | ICD-10-CM | POA: Diagnosis present

## 2018-06-01 DIAGNOSIS — Z881 Allergy status to other antibiotic agents status: Secondary | ICD-10-CM | POA: Diagnosis not present

## 2018-06-01 DIAGNOSIS — Z66 Do not resuscitate: Secondary | ICD-10-CM | POA: Diagnosis present

## 2018-06-01 DIAGNOSIS — Z8249 Family history of ischemic heart disease and other diseases of the circulatory system: Secondary | ICD-10-CM

## 2018-06-01 DIAGNOSIS — I2510 Atherosclerotic heart disease of native coronary artery without angina pectoris: Secondary | ICD-10-CM | POA: Diagnosis present

## 2018-06-01 DIAGNOSIS — I2699 Other pulmonary embolism without acute cor pulmonale: Secondary | ICD-10-CM | POA: Diagnosis present

## 2018-06-01 DIAGNOSIS — E785 Hyperlipidemia, unspecified: Secondary | ICD-10-CM | POA: Diagnosis present

## 2018-06-01 DIAGNOSIS — I4891 Unspecified atrial fibrillation: Secondary | ICD-10-CM | POA: Diagnosis present

## 2018-06-01 DIAGNOSIS — I10 Essential (primary) hypertension: Secondary | ICD-10-CM | POA: Diagnosis present

## 2018-06-01 DIAGNOSIS — I252 Old myocardial infarction: Secondary | ICD-10-CM

## 2018-06-01 DIAGNOSIS — Z85118 Personal history of other malignant neoplasm of bronchus and lung: Secondary | ICD-10-CM | POA: Diagnosis not present

## 2018-06-01 DIAGNOSIS — Z885 Allergy status to narcotic agent status: Secondary | ICD-10-CM | POA: Diagnosis not present

## 2018-06-01 DIAGNOSIS — I959 Hypotension, unspecified: Secondary | ICD-10-CM | POA: Diagnosis present

## 2018-06-01 DIAGNOSIS — Z888 Allergy status to other drugs, medicaments and biological substances status: Secondary | ICD-10-CM | POA: Diagnosis not present

## 2018-06-01 DIAGNOSIS — Z951 Presence of aortocoronary bypass graft: Secondary | ICD-10-CM

## 2018-06-01 DIAGNOSIS — R062 Wheezing: Secondary | ICD-10-CM | POA: Diagnosis not present

## 2018-06-01 DIAGNOSIS — J181 Lobar pneumonia, unspecified organism: Secondary | ICD-10-CM | POA: Diagnosis not present

## 2018-06-01 DIAGNOSIS — Z923 Personal history of irradiation: Secondary | ICD-10-CM

## 2018-06-01 HISTORY — DX: Embolism and thrombosis of unspecified artery: I74.9

## 2018-06-01 HISTORY — DX: Cellulitis of right upper limb: L03.113

## 2018-06-01 HISTORY — DX: Rash and other nonspecific skin eruption: R21

## 2018-06-01 LAB — CBC WITH DIFFERENTIAL/PLATELET
Abs Immature Granulocytes: 0.06 10*3/uL (ref 0.00–0.07)
Basophils Absolute: 0 10*3/uL (ref 0.0–0.1)
Basophils Relative: 0 %
Eosinophils Absolute: 0.1 10*3/uL (ref 0.0–0.5)
Eosinophils Relative: 3 %
HCT: 40.8 % (ref 39.0–52.0)
Hemoglobin: 13.2 g/dL (ref 13.0–17.0)
Immature Granulocytes: 1 %
Lymphocytes Relative: 13 %
Lymphs Abs: 0.7 10*3/uL (ref 0.7–4.0)
MCH: 31.2 pg (ref 26.0–34.0)
MCHC: 32.4 g/dL (ref 30.0–36.0)
MCV: 96.5 fL (ref 80.0–100.0)
Monocytes Absolute: 0.8 10*3/uL (ref 0.1–1.0)
Monocytes Relative: 15 %
Neutro Abs: 3.5 10*3/uL (ref 1.7–7.7)
Neutrophils Relative %: 68 %
Platelets: 72 10*3/uL — ABNORMAL LOW (ref 150–400)
RBC: 4.23 MIL/uL (ref 4.22–5.81)
RDW: 14 % (ref 11.5–15.5)
WBC: 5.2 10*3/uL (ref 4.0–10.5)
nRBC: 0 % (ref 0.0–0.2)

## 2018-06-01 LAB — BASIC METABOLIC PANEL
Anion gap: 6 (ref 5–15)
BUN: 22 mg/dL (ref 8–23)
CO2: 23 mmol/L (ref 22–32)
Calcium: 8.9 mg/dL (ref 8.9–10.3)
Chloride: 105 mmol/L (ref 98–111)
Creatinine, Ser: 1.89 mg/dL — ABNORMAL HIGH (ref 0.61–1.24)
GFR calc Af Amer: 36 mL/min — ABNORMAL LOW (ref >60)
GFR calc non Af Amer: 31 mL/min — ABNORMAL LOW (ref >60)
Glucose, Bld: 80 mg/dL (ref 70–99)
Potassium: 4.6 mmol/L (ref 3.5–5.1)
Sodium: 134 mmol/L — ABNORMAL LOW (ref 135–145)

## 2018-06-01 MED ORDER — ROSUVASTATIN CALCIUM 5 MG PO TABS
5.0000 mg | ORAL_TABLET | Freq: Every day | ORAL | Status: DC
  Administered 2018-06-01 – 2018-06-03 (×3): 5 mg via ORAL
  Filled 2018-06-01 (×3): qty 1

## 2018-06-01 MED ORDER — LIFITEGRAST 5 % OP SOLN: 1.0000 [drp] | Freq: Every day | OPHTHALMIC | Status: DC

## 2018-06-01 MED ORDER — CYCLOSPORINE 0.05 % OP EMUL
1.0000 [drp] | Freq: Two times a day (BID) | OPHTHALMIC | Status: DC
  Administered 2018-06-02 – 2018-06-04 (×5): 1 [drp] via OPHTHALMIC
  Filled 2018-06-01 (×6): qty 30

## 2018-06-01 MED ORDER — ENOXAPARIN SODIUM 80 MG/0.8ML ~~LOC~~ SOLN
80.0000 mg | Freq: Two times a day (BID) | SUBCUTANEOUS | Status: AC
  Administered 2018-06-02 (×2): 80 mg via SUBCUTANEOUS
  Filled 2018-06-01 (×2): qty 0.8

## 2018-06-01 MED ORDER — SODIUM CHLORIDE 0.9 % IV SOLN
INTRAVENOUS | Status: DC
  Administered 2018-06-01 – 2018-06-02 (×2): via INTRAVENOUS

## 2018-06-01 MED ORDER — ENOXAPARIN SODIUM 80 MG/0.8ML ~~LOC~~ SOLN: 80.0000 mg | Freq: Two times a day (BID) | SUBCUTANEOUS | Status: DC

## 2018-06-01 MED ORDER — SODIUM CHLORIDE 0.9 % IV SOLN
INTRAVENOUS | Status: DC | PRN
  Administered 2018-06-01: 500 mL via INTRAVENOUS

## 2018-06-01 MED ORDER — PIPERACILLIN-TAZOBACTAM 3.375 G IVPB
3.3750 g | Freq: Three times a day (TID) | INTRAVENOUS | Status: DC
  Administered 2018-06-01 – 2018-06-02 (×2): 3.375 g via INTRAVENOUS
  Filled 2018-06-01 (×3): qty 50

## 2018-06-01 MED ORDER — VANCOMYCIN HCL IN DEXTROSE 1-5 GM/200ML-% IV SOLN
1000.0000 mg | Freq: Once | INTRAVENOUS | Status: AC
  Administered 2018-06-01: 1000 mg via INTRAVENOUS
  Filled 2018-06-01: qty 200

## 2018-06-01 MED ORDER — CAMPHOR-MENTHOL 0.5-0.5 % EX LOTN
TOPICAL_LOTION | CUTANEOUS | Status: DC | PRN
  Administered 2018-06-01 – 2018-06-02 (×4): via TOPICAL
  Administered 2018-06-03: 1 via TOPICAL
  Filled 2018-06-01: qty 222

## 2018-06-01 MED ORDER — VANCOMYCIN HCL 10 G IV SOLR: 1500.0000 mg | INTRAVENOUS | Status: DC

## 2018-06-01 MED ORDER — LORAZEPAM 0.5 MG PO TABS
0.5000 mg | ORAL_TABLET | Freq: Two times a day (BID) | ORAL | Status: DC
  Administered 2018-06-01 – 2018-06-03 (×5): 0.5 mg via ORAL
  Filled 2018-06-01 (×5): qty 1

## 2018-06-01 MED ORDER — ONDANSETRON HCL 4 MG PO TABS: 4.0000 mg | ORAL_TABLET | Freq: Four times a day (QID) | ORAL | Status: DC | PRN

## 2018-06-01 MED ORDER — METOPROLOL SUCCINATE ER 25 MG PO TB24
25.0000 mg | ORAL_TABLET | Freq: Every day | ORAL | Status: DC
  Administered 2018-06-03 – 2018-06-04 (×2): 25 mg via ORAL
  Filled 2018-06-01 (×3): qty 1

## 2018-06-01 MED ORDER — ENOXAPARIN SODIUM 120 MG/0.8ML ~~LOC~~ SOLN
120.0000 mg | Freq: Every day | SUBCUTANEOUS | Status: DC
  Administered 2018-06-03: 120 mg via SUBCUTANEOUS
  Filled 2018-06-01 (×2): qty 0.8

## 2018-06-01 MED ORDER — HYDROXYZINE HCL 25 MG PO TABS
25.0000 mg | ORAL_TABLET | ORAL | Status: DC | PRN
  Administered 2018-06-01: 25 mg via ORAL
  Filled 2018-06-01 (×2): qty 1

## 2018-06-01 MED ORDER — LORAZEPAM 1 MG PO TABS
1.0000 mg | ORAL_TABLET | Freq: Every day | ORAL | Status: DC
  Administered 2018-06-02 – 2018-06-04 (×3): 1 mg via ORAL
  Filled 2018-06-01 (×3): qty 1

## 2018-06-01 MED ORDER — DOCUSATE SODIUM 100 MG PO CAPS
100.0000 mg | ORAL_CAPSULE | Freq: Two times a day (BID) | ORAL | Status: DC
  Administered 2018-06-02 – 2018-06-04 (×4): 100 mg via ORAL
  Filled 2018-06-01 (×6): qty 1

## 2018-06-01 MED ORDER — PANTOPRAZOLE SODIUM 40 MG PO TBEC
40.0000 mg | DELAYED_RELEASE_TABLET | Freq: Every day | ORAL | Status: DC
  Administered 2018-06-02 – 2018-06-04 (×3): 40 mg via ORAL
  Filled 2018-06-01 (×3): qty 1

## 2018-06-01 MED ORDER — FLUOXETINE HCL 10 MG PO CAPS
10.0000 mg | ORAL_CAPSULE | Freq: Every day | ORAL | Status: DC
  Administered 2018-06-02 – 2018-06-04 (×3): 10 mg via ORAL
  Filled 2018-06-01 (×3): qty 1

## 2018-06-01 MED ORDER — ACETAMINOPHEN 325 MG PO TABS
650.0000 mg | ORAL_TABLET | Freq: Once | ORAL | Status: AC
  Administered 2018-06-01: 650 mg via ORAL
  Filled 2018-06-01: qty 2

## 2018-06-01 MED ORDER — ACETAMINOPHEN 325 MG PO TABS
650.0000 mg | ORAL_TABLET | Freq: Four times a day (QID) | ORAL | Status: DC | PRN
  Administered 2018-06-01 – 2018-06-03 (×3): 650 mg via ORAL
  Filled 2018-06-01 (×4): qty 2

## 2018-06-01 MED ORDER — PIPERACILLIN-TAZOBACTAM 3.375 G IVPB 30 MIN
3.3750 g | Freq: Once | INTRAVENOUS | Status: AC
  Administered 2018-06-01: 3.375 g via INTRAVENOUS
  Filled 2018-06-01: qty 50

## 2018-06-01 MED ORDER — ACETAMINOPHEN 650 MG RE SUPP: 650.0000 mg | Freq: Four times a day (QID) | RECTAL | Status: DC | PRN

## 2018-06-01 MED ORDER — VANCOMYCIN HCL 500 MG IV SOLR
500.0000 mg | Freq: Once | INTRAVENOUS | Status: AC
  Administered 2018-06-01: 500 mg via INTRAVENOUS
  Filled 2018-06-01: qty 500

## 2018-06-01 MED ORDER — TAMSULOSIN HCL 0.4 MG PO CAPS
0.4000 mg | ORAL_CAPSULE | Freq: Every day | ORAL | Status: DC
  Administered 2018-06-01 – 2018-06-03 (×3): 0.4 mg via ORAL
  Filled 2018-06-01 (×4): qty 1

## 2018-06-01 MED ORDER — ONDANSETRON HCL 4 MG/2ML IJ SOLN: 4.0000 mg | Freq: Four times a day (QID) | INTRAMUSCULAR | Status: DC | PRN

## 2018-06-01 NOTE — ED Provider Notes (Signed)
[place] EMERGENCY DEPARTMENT Provider Note   CSN: [id number] Arrival date & time: 06/01/18  5400    History   Chief Complaint Chief Complaint  Patient presents with  . Wound Infection    HPI Evan [name] is a 85 y.o. male.     The history is provided by the patient and medical records. No language interpreter was used.   Evan [name] is a 85 y.o. male who presents to the Emergency Department complaining of wound infection. He presents to the emergency department for evaluation of right arm wound. He has been followed by dermatology for itchy rash that has been migrating from his arms to his back. About one week ago he noticed hematoma to his right arm. He was started on Keflex three times daily on February 3. Yesterday he was seen by dermatology and he was prescribed Bactrim one tab BID. Since taking the Bactrim his swelling and redness has significantly worsened. He does report some pain to the right arm as well. He is on Lovenox for history of pulmonary embolism and takes [place] twice daily. In January he was culture positive for M RSA.  He denies fevers, chills, chest pain. He does have chronic shortness of breath and this is at his baseline. Symptoms are moderate to severe, constant, worsening. Past Medical History:  Diagnosis Date  . Adenocarcinoma of right lung ([place]) 01/06/2016  . Anginal chest pain at rest [facility])    Chronic non, controlled on Lorazepam  . Anxiety   . Arthritis    "all over"   . BPH (benign prostatic hyperplasia)   . CAD (coronary artery disease)   . Chronic lower back pain   . Complication of anesthesia    "problems making water afterwards"  . Depression   . DJD (degenerative joint disease)   . Esophageal ulcer without bleeding    bid ppi indefinitely  . Family history of adverse reaction to anesthesia    "children w/PONV"  . GERD (gastroesophageal reflux disease)   . Headache    "couple/week maybe" (09/04/2015)  . Hemochromatosis    Possible, elevated iron stores, hook-like osteophytes on hand films, normal LFTs  . Hepatitis    "yellow jaundice as a baby"  . History of ASCVD    MULTIVESSEL  . Hyperlipidemia   . Hypertension   . Myocardial infarction ([place]) 1999  . Osteoarthritis   . Pneumonia    "several times; got a little now" (09/04/2015)  . Rheumatoid arthritis ([place])   . Subdural hematoma ([facility])    small after fall 09/2016-plavix held, neurosurgery consulted.  Asymptomatic.    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 Cellulitis of arm, right 06/01/2018  . Rash and nonspecific skin eruption 06/01/2018  . Chest pain 02/02/2018  . Thrombocytopenia ([place]) 02/02/2018  . Hypokalemia 02/02/2018  . Pulmonary embolism ([place]) 02/02/2018  . Elevated troponin 02/02/2018  . Pulmonary embolus ([place]) 02/02/2018  . Non-ST elevation MI (NSTEMI) ([place]) 06/27/2017  . Diastolic CHF ([place]) 86[birth year]76[birth year]1950  . Chest pain due to CAD ([place]) 06/26/2017  . Dyspnea 06/21/2017  . Acute bronchitis 05/15/2017  . Oral candidiasis 05/15/2017  . Acute pericarditis   . Chest pain at rest 12/20/2016  . Angina pectoris ([place]) 12/20/2016  . Radiation pneumonitis ([place]) 11/09/2016  . Pleurisy 10/11/2016  . Chest wall pain 10/11/2016  . Atrial fibrillation ([place]) 05/21/2016  .  Adenocarcinoma of right lung ([place]) 01/06/2016  . GAD (generalized anxiety disorder) 11/20/2015  . Essential hypertension   . Cellulitis 09/04/2015  . Cellulitis of left axilla   . Esophageal ulcer without bleeding   . Bilateral lower extremity edema 04/28/2014  . BPH (benign prostatic hyperplasia) 10/08/2012  . Anxiety   . EKG abnormalities 09/05/2012  . Tremor 09/05/2012  . Chronic fatigue 09/05/2012  . Arthritis   . Asthma, chronic   . Reflux esophagitis   . S/P CABG (coronary artery bypass graft)   . GERD (gastroesophageal reflux disease)   . Hyperlipidemia   .  Hypertension   . UNSPECIFIED PERIPHERAL VASCULAR DISEASE 06/16/2009    Past Surgical History:  Procedure Laterality Date  . ARM SKIN LESION BIOPSY / EXCISION Left 09/02/2015  . CATARACT EXTRACTION W/ INTRAOCULAR LENS  IMPLANT, BILATERAL Bilateral   . CHOLECYSTECTOMY OPEN    . CORNEAL TRANSPLANT Bilateral    "one at [facility]; one at [facility]"  . CORONARY ANGIOPLASTY WITH STENT PLACEMENT    . CORONARY ARTERY BYPASS GRAFT  1999  . DESCENDING AORTIC ANEURYSM REPAIR W/ STENT     DE stent ostium into the right radial free graft at OM-, 02-2007  . ESOPHAGOGASTRODUODENOSCOPY N/A 11/09/2014   Procedure: ESOPHAGOGASTRODUODENOSCOPY (EGD);  Surgeon: [name], MD;  Location[facility] ENDOSCOPY;  Service: Endoscopy;  Laterality: N/A;  . INGUINAL HERNIA REPAIR    . JOINT REPLACEMENT    . LEFT HEART CATH AND CORS/GRAFTS ANGIOGRAPHY N/A 06/27/2017   Procedure: LEFT HEART CATH AND CORS/GRAFTS ANGIOGRAPHY;  Surgeon: [name], MD;  Location: [place] CV LAB;  Service: Cardiovascular;  Laterality: N/A;  . NASAL SINUS SURGERY    . SHOULDER OPEN ROTATOR CUFF REPAIR Right   . TOTAL KNEE ARTHROPLASTY Bilateral         Home Medications    Prior to Admission medications   Medication Sig Start Date End Date Taking? Authorizing Provider  acetaminophen (TYLENOL) 500 MG tablet Take 1,000 mg by mouth every 6 (six) hours as needed for mild pain.   Yes [provider]  Cholecalciferol 2000 units TABS Take 1 tablet (2,000 Units total) by mouth daily. 02/04/16  Yes [name], MD  clobetasol cream (TEMOVATE) 4.00 % Apply 1 application topically 2 (two) times daily. 04/20/18  Yes [name], MD  enoxaparin (LOVENOX) 80 MG/0.8ML injection Inject 80 mg into the skin every 12 (twelve) hours.   Yes [provider]  ENSURE (ENSURE) Take 237 mLs by mouth 2 (two) times daily between meals.   Yes [provider]  FLUoxetine (PROZAC) 10 MG capsule Take 1 capsule (10 mg total) by mouth daily.  03/15/18  Yes [name], MD  furosemide (LASIX) 20 MG tablet Take 1 tablet (20 mg total) once a day. Take an extra tablet (20 mg total) for increased swelling, weight gain, or shortness of breath. 01/16/18  Yes Jettie Booze, MD  Lifitegrast Shirley Friar) 5 % SOLN Place 1-2 drops into both eyes daily.    Yes [provider]  LORazepam (ATIVAN) 1 MG tablet Take 1 tablet (1 mg total) by mouth every 8 (eight) hours as needed for anxiety. Patient taking differently: Take 0.5-1 mg by mouth See admin instructions. Taking 1mg  in the AM, 0.5mg  (1/2 tablet) at lunch and 1/2 tablet (0.5mg ) at bedtime. 04/03/18  Yes [name], MD  magnesium oxide (MAG-OX) 400 (241.3 Mg) MG tablet Take 1 tablet (400 mg total) by mouth 2 (two) times daily.  04/02/18  Yes [name], MD  metoprolol succinate (TOPROL-XL) 25 MG 24 hr tablet TAKE ONE (1) TABLET BY MOUTH EVERY DAY 04/02/18  Yes [name], MD  mupirocin ointment (BACTROBAN) 2 % Apply 1 application topically 2 (two) times daily. 05/30/18  Yes [provider]  nitroGLYCERIN (NITROSTAT) 0.4 MG SL tablet Place 1 tablet (0.4 mg total) under the tongue every 5 (five) minutes as needed for chest pain. 12/06/17  Yes Daune Perch, NP  pantoprazole (PROTONIX) 40 MG tablet Take one (1) tablet (40 mg) by mouth daily. 02/21/17  Yes Jettie Booze, MD  rosuvastatin (CRESTOR) 5 MG tablet TAKE 5 mg  EVERY DAY 07/07/17  Yes Jettie Booze, MD  sulfamethoxazole-trimethoprim (BACTRIM DS,SEPTRA DS) 800-160 MG tablet Take 1 tablet by mouth 2 (two) times daily. Started on 05-30-18 DS 20 05/30/18  Yes [provider]  tamsulosin (FLOMAX) 0.4 MG CAPS capsule TAKE 1 CAPSULE EVERY DAY 10/18/17  Yes [name], MD  enoxaparin (LOVENOX) 120 MG/0.8ML injection Inject 0.8 mLs (120 mg total) into the skin daily. 05/31/18   [name], MD    Family History Family History  Problem Relation Age of Onset  .  Arthritis-Osteo Sister   . Heart attack Brother   . Heart attack Other   . Cancer Neg Hx     Social History Social History   Tobacco Use  . Smoking status: Never Smoker  . Smokeless tobacco: Former Systems developer    Types: Chew  . Tobacco comment: "quit chewing in the 1960s"  Substance Use Topics  . Alcohol use: No  . Drug use: No     Allergies   Feldene [piroxicam]; Statins; Doxycycline; Latex; Tequin; and Valium   Review of Systems Review of Systems  All other systems reviewed and are negative.    Physical Exam Updated Vital Signs BP 124/62 (BP Location: Left Arm)   Pulse (!) 50   Temp (!) 97.4 F (36.3 C) (Oral)   Resp 18   Ht 5\' 8"  (1.727 m)   Wt 80.7 kg   SpO2 99%   BMI 27.06 kg/m   Physical Exam Vitals signs and nursing note reviewed.  Constitutional:      Appearance: He is well-developed.  HENT:     Head: Normocephalic and atraumatic.  Cardiovascular:     Rate and Rhythm: Normal rate and regular rhythm.     Heart sounds: No murmur.  Pulmonary:     Effort: Pulmonary effort is normal. No respiratory distress.     Breath sounds: Normal breath sounds.  Abdominal:     Palpations: Abdomen is soft.     Tenderness: There is no abdominal tenderness. There is no guarding or rebound.  Musculoskeletal:     Comments: 2+ radial pulses bilaterally. There is significant erythema and edema to the right forearm as well as the right hand. There is a large hematoma to the right forearm. Flexion extension is intact throughout the digits and wrist. There is mild diffuse tenderness to palpation throughout the forearm.  Skin:    General: Skin is warm and dry.  Neurological:     Mental Status: He is alert and oriented to person, place, and time.  Psychiatric:        Behavior: Behavior normal.        ED Treatments / Results  Labs (all labs ordered are listed, but only abnormal results are displayed) Labs Reviewed  BASIC METABOLIC PANEL - Abnormal; Notable for the  following  components:      Result Value   Sodium 134 (*)    Creatinine, Ser 1.89 (*)    GFR calc non Af Amer 31 (*)    GFR calc Af Amer 36 (*)    All other components within normal limits  CBC WITH DIFFERENTIAL/PLATELET - Abnormal; Notable for the following components:   Platelets 72 (*)    All other components within normal limits    EKG None  Radiology No results found.  Procedures .Marland KitchenIncision and Drainage Date/Time: 06/01/2018 1:04 PM Performed by: Quintella Reichert, MD Authorized by: Quintella Reichert, MD   Consent:    Consent obtained:  Verbal   Consent given by:  Patient   Risks discussed:  Bleeding, incomplete drainage, pain and infection Location:    Type:  Hematoma   Size:  4 cm   Location:  Upper extremity   Upper extremity location:  Arm   Arm location:  R lower arm Pre-procedure details:    Skin preparation:  Chloraprep Anesthesia (see MAR for exact dosages):    Anesthesia method:  None Procedure type:    Complexity:  Simple Procedure details:    Needle aspiration: yes     Needle size:  18 G   Drainage:  Bloody   Drainage amount: 10 cc. Post-procedure details:    Patient tolerance of procedure:  Tolerated well, no immediate complications   (including critical care time)  Medications Ordered in ED Medications  metoprolol succinate (TOPROL-XL) 24 hr tablet 25 mg (has no administration in time range)  rosuvastatin (CRESTOR) tablet 5 mg (has no administration in time range)  FLUoxetine (PROZAC) capsule 10 mg (has no administration in time range)  LORazepam (ATIVAN) tablet 1 mg (has no administration in time range)  pantoprazole (PROTONIX) EC tablet 40 mg (has no administration in time range)  tamsulosin (FLOMAX) capsule 0.4 mg (has no administration in time range)  enoxaparin (LOVENOX) injection 120 mg (has no administration in time range)  acetaminophen (TYLENOL) tablet 650 mg (650 mg Oral Given 06/01/18 1506)    Or  acetaminophen (TYLENOL) suppository  650 mg ( Rectal See Alternative 06/01/18 1506)  docusate sodium (COLACE) capsule 100 mg (has no administration in time range)  ondansetron (ZOFRAN) tablet 4 mg (has no administration in time range)    Or  ondansetron (ZOFRAN) injection 4 mg (has no administration in time range)  piperacillin-tazobactam (ZOSYN) IVPB 3.375 g (has no administration in time range)  camphor-menthol (SARNA) lotion (has no administration in time range)  hydrOXYzine (ATARAX/VISTARIL) tablet 25 mg (has no administration in time range)  LORazepam (ATIVAN) tablet 0.5 mg (has no administration in time range)  cycloSPORINE (RESTASIS) 0.05 % ophthalmic emulsion 1 drop (has no administration in time range)  vancomycin (VANCOCIN) 500 mg in sodium chloride 0.9 % 100 mL IVPB (has no administration in time range)  vancomycin (VANCOCIN) 1,500 mg in sodium chloride 0.9 % 500 mL IVPB (has no administration in time range)  piperacillin-tazobactam (ZOSYN) IVPB 3.375 g (has no administration in time range)  0.9 %  sodium chloride infusion (has no administration in time range)  enoxaparin (LOVENOX) injection 80 mg (has no administration in time range)  acetaminophen (TYLENOL) tablet 650 mg (650 mg Oral Given 06/01/18 1050)  vancomycin (VANCOCIN) IVPB 1000 mg/200 mL premix (0 mg Intravenous Stopped 06/01/18 1228)     Initial Impression / Assessment and Plan / ED Course  I have reviewed the triage vital signs and the nursing notes.  Pertinent labs & imaging results  that were available during my care of the patient were reviewed by me and considered in my medical decision making (see chart for details).        Patient presents to the emergency department for swelling to the right arm. He has cellulitis to the right upper extremity as well is a hematoma to the dorsal arm. The hematoma was drained by needle aspiration. Hematoma did partially re-accumulate prior to applying a pressure dressing to the area. He was treated with antibiotics  for his cellulitis. Medicine consulted for admission.  Final Clinical Impressions(s) / ED Diagnoses   Final diagnoses:  None    ED Discharge Orders    None       Quintella Reichert, MD 06/01/18 1635

## 2018-06-01 NOTE — Progress Notes (Addendum)
Pharmacy Antibiotic Note  Evan [name] is a 85 y.o. male admitted on 06/01/2018 with cellulitis of right arm.  Pharmacy has been consulted for Vancomycin and Zosyn dosing.  Noted recent course of Cephalexin 2/3>>2/26 the Septra DS but worsening.  Right arm hematoma drained in ED.     Vancomcyin 1gm IV given in ED ~11am.  Zosyn 3.375 gm IV over 30 minutes to be given now.   Creatinine up from baseline 1.1-1.4.  Plan:  Zosyn 3.375 gm IV q8hrs (each over 4 hours) to begin tonight.  Additional Vancomycin 500 mg IV x 1 this afternoon.  Vancomycin 1500 mg IV q48hrs to begin on 3/1  Goal AUC 400-550.  Expected AUC: 494  SCr used: 1.89  Will follow renal function for any need to adjust regimens.  Follow clinical course, antibiotic plans.  Noted lifelong Lovenox with plan to change from 80 mg sq q12hrs to 120 mg sq q24hrs on 3/1.  Discussed with Dr. Lorin Mercy. Will skip tonight's dose with crcl <30 ml/min and resume Lovenox 80 mg sq 12hrs on 2/29 x 2 doses. Then 120 mg SQ q24hrs to begin on 3/1 as planned.  If creatinine clearance remains <30 ml/min, will need to modify to 80 mg sq q24hrs.    Height: 5\' 8"  (172.7 cm) Weight: 178 lb (80.7 kg) IBW/kg (Calculated) : 68.4  Temp (24hrs), Avg:97.8 F (36.6 C), Min:97.4 F (36.3 C), Max:98.1 F (36.7 C)  Recent Labs  Lab 06/01/18 1051  WBC 5.2  CREATININE 1.89*    Estimated Creatinine Clearance: 26.1 mL/min (A) (by C-G formula based on SCr of 1.89 mg/dL (H)).    Allergies  Allergen Reactions  . Feldene [Piroxicam] Other (See Comments)    REACTION: Blisters  . Statins Other (See Comments)    Myalgias  . Doxycycline Other (See Comments)    REACTION:  unknown  . Latex Rash  . Tequin Anxiety  . Valium Other (See Comments)    REACTION:  unknown    Antimicrobials this admission:   Vancomycin 2/28>>   Zosyn 2/28 >>  Dose adjustments this admission:  n/a  Microbiology results:  No cultures  Thank you for allowing pharmacy to be a part  of this patient's care.  Arty Baumgartner, [place] Pager: [phone] or phone: [phone] 06/01/2018 3:26 PM

## 2018-06-01 NOTE — Progress Notes (Signed)
Patient admitted to room 5c04, alert oriented room air. Patient admitted for cellulitis of right arm, dressing includes gauze and ace wrap. Right arm elevated on pillow 3+pitting edema noted. Oriented to room and instructed on safety. Family at bedside.

## 2018-06-01 NOTE — ED Triage Notes (Addendum)
Wound infection to right lower arm, worsening in the past 3-4 days.  States he has been taking sulfa prescribed by Dr. Elvera Lennox (dermatology).  Prior to this he was taking keflex which was started on 05/07/18. Reports he has been scratching this arm and now feels like his left arm is pruritic and his back.  Reports currently on blood thinner.

## 2018-06-01 NOTE — H&P (Signed)
History and Physical    Evan [name] OFB:[id number] DOB: [birth year] DOA: 06/01/2018  PCP: [name], MD Consultants:  [facility] - cardiology; [facility] - pulmonology; [facility] - oncology; [facility] - dermatology Patient coming from:  Home - lives with daughter; Donald Prose: Daughter, [phone]  Chief Complaint: arm infection  HPI: Evan [name] is a 85 y.o. male with medical history significant of SDH 09/2016; RA; CAD s/p CABG; HTN; HLD; PE on Lovenox; and R lung CA presenting with R arm infection.  His arm "had a little place there, and all this mess come up there."  He has bene having trouble with itching of the R arm and a rash for months.  The rash has been somewhat migratory.  He was given Bactrim on 2/27.  Prior to this, he was on Keflex 2/3 through last week, 2/27.  He cultured positive for MRSA on Thursday.  He was on prednisone but worsened when he stopped the prednisone.  He has had the rash before.  The surrounding erythema on the arm worsened starting Monday of this week.  The hematoma also developed on Monday.   ED Course: Rash since Jan - saw derm, antibiotics and steroids.  Derm gave Bactrim yesterday, worse today.  Cellulitis R arm; drained hematoma and it recollected so placing a pressure dressing.  On Lovenox for PE.  Review of Systems: As per HPI; otherwise review of systems reviewed and negative.   Ambulatory Status:  Ambulates without assistance  Past Medical History:  Diagnosis Date  . Adenocarcinoma of right lung ([place]) 01/06/2016  . Anginal chest pain at rest [facility])    Chronic non, controlled on Lorazepam  . Anxiety   . Arthritis    "all over"   . BPH (benign prostatic hyperplasia)   . CAD (coronary artery disease)   . Chronic lower back pain   . Complication of anesthesia    "problems making water afterwards"  . Depression   . DJD (degenerative joint disease)   . Esophageal ulcer without bleeding    bid ppi indefinitely  . Family history of adverse reaction to  anesthesia    "children w/PONV"  . GERD (gastroesophageal reflux disease)   . Headache    "couple/week maybe" (09/04/2015)  . Hemochromatosis    Possible, elevated iron stores, hook-like osteophytes on hand films, normal LFTs  . Hepatitis    "yellow jaundice as a baby"  . History of ASCVD    MULTIVESSEL  . Hyperlipidemia   . Hypertension   . Myocardial infarction ([place]) 1999  . Osteoarthritis   . Pneumonia    "several times; got a little now" (09/04/2015)  . Rheumatoid arthritis ([place])   . Subdural hematoma (HCC)    small after fall 09/2016-plavix held, neurosurgery consulted.  Asymptomatic.    Marland Kitchen 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[place]) 02/2018   on Lovenox lifelong since he failed PO Eliquis    Past Surgical History:  Procedure Laterality Date  . ARM SKIN LESION BIOPSY / EXCISION Left 09/02/2015  . CATARACT EXTRACTION W/ INTRAOCULAR LENS  IMPLANT, BILATERAL Bilateral   . CHOLECYSTECTOMY OPEN    . CORNEAL TRANSPLANT Bilateral    "one at [facility]; [facility]"  . CORONARY ANGIOPLASTY WITH STENT PLACEMENT    . CORONARY ARTERY BYPASS GRAFT  1999  . DESCENDING AORTIC ANEURYSM REPAIR W/ STENT     DE stent ostium into the right radial free graft at OM-, 02-2007  . ESOPHAGOGASTRODUODENOSCOPY N/A 11/09/2014   Procedure: ESOPHAGOGASTRODUODENOSCOPY (EGD);  Surgeon: [name], MD;  Location: [facility];  Service: Endoscopy;  Laterality: N/A;  . INGUINAL HERNIA REPAIR    . JOINT REPLACEMENT    . LEFT HEART CATH AND CORS/GRAFTS ANGIOGRAPHY N/A 06/27/2017   Procedure: LEFT HEART CATH AND CORS/GRAFTS ANGIOGRAPHY;  Surgeon: [name], MD;  Location: [facility];  Service: Cardiovascular;  Laterality: N/A;  . NASAL SINUS SURGERY    . SHOULDER OPEN ROTATOR CUFF REPAIR Right   . TOTAL KNEE ARTHROPLASTY Bilateral     Social History   Socioeconomic History  . Marital status: Widowed    Spouse name: Not on file  . Number of children: Not on file  . Years of education: Not on file  . Highest education  level: Not on file  Occupational History  . Not on file  Social Needs  . Financial resource strain: Not on file  . Food insecurity:    Worry: Not on file    Inability: Not on file  . Transportation needs:    Medical: Not on file    Non-medical: Not on file  Tobacco Use  . Smoking status: Never Smoker  . Smokeless tobacco: Former Systems developer    Types: Chew  . Tobacco comment: "quit chewing in the 1960s"  Substance and Sexual Activity  . Alcohol use: No  . Drug use: No  . Sexual activity: Never  Lifestyle  . Physical activity:    Days per week: Not on file    Minutes per session: Not on file  . Stress: Not on file  Relationships  . Social connections:    Talks on phone: Not on file    Gets together: Not on file    Attends religious service: Not on file    Active member of club or organization: Not on file    Attends meetings of clubs or organizations: Not on file    Relationship status: Not on file  . Intimate partner violence:    Fear of current or ex partner: Not on file    Emotionally abused: Not on file    Physically abused: Not on file    Forced sexual activity: Not on file  Other Topics Concern  . Not on file  Social History Narrative   01-05-18 Unable to ask abuse questions family with him today.   11-012-19 Unable to ask abuse questions family with him today.    Allergies  Allergen Reactions  . Feldene [Piroxicam] Other (See Comments)    REACTION: Blisters  . Statins Other (See Comments)    Myalgias  . Doxycycline Other (See Comments)    REACTION:  unknown  . Latex Rash  . Tequin Anxiety  . Valium Other (See Comments)    REACTION:  unknown    Family History  Problem Relation Age of Onset  . Arthritis-Osteo Sister   . Heart attack Brother   . Heart attack Other   . Cancer Neg Hx     Prior to Admission medications   Medication Sig Start Date End Date Taking? Authorizing Provider  cephALEXin (KEFLEX) 500 MG capsule  05/07/18   [provider]    Cholecalciferol 2000 units TABS Take 1 tablet (2,000 Units total) by mouth daily. 02/04/16   [name], MD  clobetasol cream (TEMOVATE) 3.23 % Apply 1 application topically 2 (two) times daily. 04/20/18   [name], MD  enoxaparin (LOVENOX) 120 MG/0.8ML injection Inject 0.8 mLs (120 mg total) into the skin daily. 05/31/18   [name], MD  ENSURE (ENSURE) Take 237 mLs by mouth 2 (two) times daily between meals.    [provider]  FLUoxetine (PROZAC) 10 MG capsule Take 1 capsule (10 mg total) by mouth daily. 03/15/18   [name], MD  furosemide (LASIX) 20 MG tablet Take 1 tablet (20 mg total) once a day. Take an extra tablet (20 mg total) for increased swelling, weight gain, or shortness of breath. 01/16/18   Jettie Booze, MD  ipratropium-albuterol (DUONEB) 0.5-2.5 (3) MG/3ML SOLN Take 3 mLs by nebulization every 4 (four) hours as needed (or Chest Pain / Tightness). 12/23/16   Kilroy, Doreene Burke, PA-C  Lifitegrast Shirley Friar) 5 % SOLN Place 1-2 drops into both eyes daily.     [provider]  LORazepam (ATIVAN) 1 MG tablet Take 1 tablet (1 mg total) by mouth every 8 (eight) hours as needed for anxiety. 04/03/18   [name], MD  magnesium oxide (MAG-OX) 400 (241.3 Mg) MG tablet Take 1 tablet (400 mg total) by mouth 2 (two) times daily. 04/02/18   [name], MD  metoprolol succinate (TOPROL-XL) 25 MG 24 hr tablet TAKE ONE (1) TABLET BY MOUTH EVERY DAY 04/02/18   [name], MD  nitroGLYCERIN (NITROSTAT) 0.4 MG SL tablet Place 1 tablet (0.4 mg total) under the tongue every 5 (five) minutes as needed for chest pain. 12/06/17   Daune Perch, NP  pantoprazole (PROTONIX) 40 MG tablet Take one (1) tablet (40 mg) by mouth daily. 02/21/17   Jettie Booze, MD  predniSONE (DELTASONE) 10 MG tablet  05/07/18   [provider]  rosuvastatin (CRESTOR) 5 MG tablet TAKE 5 mg  EVERY DAY 07/07/17   Jettie Booze, MD  tamsulosin  (FLOMAX) 0.4 MG CAPS capsule TAKE 1 CAPSULE EVERY DAY 10/18/17   [name], MD    Physical Exam: Vitals:   06/01/18 1330 06/01/18 1345 06/01/18 1400 06/01/18 1441  BP: 115/60 97/74 98/62  124/62  Pulse: 73 67 (!) 56 (!) 50  Resp:    18  Temp:    (!) 97.4 F (36.3 C)  TempSrc:    Oral  SpO2: 97% 99% 98% 99%  Weight:      Height:         . General:  Appears calm and comfortable and is NAD . Eyes:  PERRL, EOMI, normal lids, iris . ENT:  grossly normal hearing, lips & tongue, mmm . Neck:  no LAD, masses or thyromegaly . Cardiovascular:  Irregularly irregular but rate controlled, no m/r/g. No LE edema.  Marland Kitchen Respiratory:   CTA bilaterally with no wheezes/rales/rhonchi.  Normal respiratory effort. . Abdomen:  soft, NT, ND, NABS . Back:   normal alignment, no CVAT . Skin:  Cellulitis of RLE, as pictured below, with edema and erythema along the entire forearm and most of the upper arm.  There is a hematoma on the left anterior forearm that reaccummulated after drainage and a pressure dressing has since been applied.  There is also a small amount of streaking erythema on the left inner forearm and a maculopapular rash along his left abdomen and side.  He has marked ecchymoses on his abdomen from Lovenox injections.           . Musculoskeletal:  grossly normal tone BUE/BLE, good ROM, no bony abnormality . Psychiatric:  grossly normal mood and affect, speech fluent and appropriate, AOx3 . Neurologic:  CN 2-12 grossly intact, moves all extremities in coordinated fashion, sensation intact  Radiological Exams on Admission: No results found.  EKG: not done   Labs on Admission: I have personally reviewed the available labs and imaging studies at the time of the admission.  Pertinent labs:   BUN 22/Creatinine 1.89/GFR 31; BUN 25/Creatinine 1.35/GFR 47 WBC 5.2 Platelets 72   Assessment/Plan Principal Problem:   Cellulitis of arm, right Active Problems:   Essential  hypertension   Adenocarcinoma of right lung (HCC)   Atrial fibrillation (HCC)   Pulmonary embolism (HCC)   Rash and nonspecific skin eruption   Cellulitis of R arm/Rash -Patient with reported recent wound culture for MRSA presenting with progressive RUE cellulitis -Currently with erythema to almost entire R arm -He has a large hematoma which was evacuated and immediately redeveloped -I spoke with Dr. Elvera Lennox - "he has a long history of some kind of skin problem.  I don't know what that is."  -He presented for initial visit with no current complaints and records were never received.  -He was hospitalized at some point "for staph, according to the family"; no records were available.  -Presented 04/27/18 - had rash, had been given antibiotics.  Culture of skin with MSSA, given Keflex and triamcinolone.  -Returned 2/3, did not think he was better and was itchy.  Given prednisone and prolonged Keflex for another 30 days.  -Returned on 2/26 with RUE cellulitis.  Give reported h/o staph, concern for MSRA and so placed on Bactrim.  He was re-cultured and this has not resulted yet. -Normal WBC count -He was given Vanc in the ER -Will observe for now with Vanc and Zosyn. -Will treat pruritis with Sarna, hydroxyzine. -He was seen by [facility] derm in 2/17 with dermatitis NOS and biopsy was considered but does not appear to have been done. -He was also seen in 7/18 with "generalized pruritis without rash" and treated with Sarna. -Would suggest consideration of inpatient vs. Outpatient skin biopsy and possibly culture; culture of superficial skin including in cellulitis is generally ineffective in identifying true pathogenic etiology.  PE -11/19 CTA with PE despite Eliquis (with reported compliance) -Patient was changed to lifelong Lovenox -He has been on BID Lovenox and is due to change to daily Lovenox on 3/1; this has been ordered accordingly  Lung CA -He appears to have last been seen by Dr. Julien Nordmann  in 5/18 for stage 1 adendocarcinoma of the R lung and was recommended for ongoing follow-up with Dr. Tammi Klippel -He appears to have been in remission at that time -CTA in 11/19 showed a significant enlargement of right paratracheal lymph node that could be metastatic in nature; outpatient f/u was recommended -He was seen by rad onc on 02/13/18 and this LN was thought to be reactive in nature; he was recommended to have repeat CT in about 4 months -He is due for repeat chest CT with contrast on about March 2 -Given the propensity of patients with NSCLC to have pruritis associated with paraneoplastic syndromes, will order this CT now  HTN -Continue Toprol XL  Afib -Rate controlled on Toprol -On lifelong Lovenox    DVT prophylaxis: Treatment-dose Lovenox  Code Status:  DNR - confirmed with patient/family Family Communication: Son present throughout evaluation  Disposition Plan:  Home once clinically improved Consults called: Dermatology (telephone only)  Admission status: It is my clinical opinion that referral for OBSERVATION is reasonable and necessary in this patient based on the above information provided. The aforementioned taken together are felt to place the patient at high risk for further clinical  deterioration. However it is anticipated that the patient may be medically stable for discharge from the hospital within 24 to 48 hours.    [name] MD [facility]   How to contact the [facility] Attending or Consulting provider [place] or covering provider during after hours [place], for this patient?  1. Check the care team in [facility] and look for a) attending/consulting [facility] provider listed and b) the [facility] team listed 2. Log into [email or web address] and use [facility]'s universal password to access. If you do not have the password, please contact the hospital operator. 3. Locate the [facility] provider you are looking for under [facility] and page to a number that you can be directly  reached. 4. If you still have difficulty reaching the provider, please page the [facility] (Director on Call) for the Hospitalists listed on amion for assistance.   06/01/2018, 3:06 PM

## 2018-06-01 NOTE — ED Notes (Signed)
ED TO INPATIENT HANDOFF REPORT  Name/Age/Gender Evan [name] 85 y.o. male  Code Status Code Status History    Date Active Date Inactive Code Status Order ID Comments User Context   02/02/2018 1246 02/03/2018 1756 Full Code [id number]  Toy Baker, MD ED   06/26/2017 1558 06/28/2017 1503 Full Code [id number]  Kathi Ludwig, MD ED   12/20/2016 1932 12/23/2016 2239 Full Code [id number]  Isaiah Serge, NP ED   05/21/2016 0351 05/23/2016 1859 Full Code [id number]  Vianne Bulls, MD ED   09/04/2015 1521 09/10/2015 1534 Full Code [id number]  Elease Hashimoto ED      Home/SNF/Other Home  Chief Complaint wound check/infection  Level of Care/Admitting Diagnosis ED Disposition    ED Disposition Condition [facility] Area: [place] [100100]  Level of Care: Med-Surg [16]  I expect the patient will be discharged within 24 hours: Yes  LOW acuity---Tx typically complete <24 hrs---ACUTE conditions typically can be evaluated <24 hours---LABS likely to return to acceptable levels <24 hours---IS near functional baseline---EXPECTED to return to current living arrangement---NOT newly hypoxic: Meets criteria for 5C-Observation unit  Diagnosis: Cellulitis of arm, right [160109]  Admitting Physician: [name] [[id number]]  Attending Physician: [name] [[id number]]  PT Class (Do Not Modify): Observation [104]  PT Acc Code (Do Not Modify): Observation [10022]       Medical History Past Medical History:  Diagnosis Date  . Adenocarcinoma of right lung ([place]) 01/06/2016  . Anginal chest pain at rest [facility])    Chronic non, controlled on Lorazepam  . Anxiety   . Arthritis    "all over"   . BPH (benign prostatic hyperplasia)   . CAD (coronary artery disease)   . Chronic lower back pain   . Complication of anesthesia    "problems making water afterwards"  . Depression   . DJD (degenerative joint disease)   . Esophageal ulcer without bleeding    bid ppi  indefinitely  . Family history of adverse reaction to anesthesia    "children w/PONV"  . GERD (gastroesophageal reflux disease)   . Headache    "couple/week maybe" (09/04/2015)  . Hemochromatosis    Possible, elevated iron stores, hook-like osteophytes on hand films, normal LFTs  . Hepatitis    "yellow jaundice as a baby"  . History of ASCVD    MULTIVESSEL  . Hyperlipidemia   . Hypertension   . Myocardial infarction ([place]) 1999  . Osteoarthritis   . Pneumonia    "several times; got a little now" (09/04/2015)  . Rheumatoid arthritis ([place])   . Subdural hematoma (HCC)    small after fall 09/2016-plavix held, neurosurgery consulted.  Asymptomatic.    Marland Kitchen 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[place]) 02/2018   on Lovenox lifelong since he failed PO Eliquis    Allergies Allergies  Allergen Reactions  . Feldene [Piroxicam] Other (See Comments)    REACTION: Blisters  . Statins Other (See Comments)    Myalgias  . Doxycycline Other (See Comments)    REACTION:  unknown  . Latex Rash  . Tequin Anxiety  . Valium Other (See Comments)    REACTION:  unknown    IV Location/Drains/Wounds Patient Lines/Drains/Airways Status   Active Line/Drains/Airways    Name:   Placement date:   Placement time:   Site:   Days:   Peripheral IV 06/01/18 Left Antecubital   06/01/18    1040    Antecubital   less than 1  Labs/Imaging Results for orders placed or performed during the hospital encounter of 06/01/18 (from the past 48 hour(s))  Basic metabolic panel     Status: Abnormal   Collection Time: 06/01/18 10:51 AM  Result Value Ref Range   Sodium 134 (L) 135 - 145 mmol/L   Potassium 4.6 3.5 - 5.1 mmol/L   Chloride 105 98 - 111 mmol/L   CO2 23 22 - 32 mmol/L   Glucose, Bld 80 70 - 99 mg/dL   BUN 22 8 - 23 mg/dL   Creatinine, Ser 1.89 (H) 0.61 - 1.24 mg/dL   Calcium 8.9 8.9 - 10.3 mg/dL   GFR calc non Af Amer 31 (L) >60 mL/min   GFR calc Af Amer 36 (L) >60 mL/min   Anion gap 6 5 - 15    Comment: Performed at  [facility], [address] [place], [place] [address]  CBC with Differential     Status: Abnormal   Collection Time: 06/01/18 10:51 AM  Result Value Ref Range   WBC 5.2 4.0 - 10.5 K/uL   RBC 4.23 4.22 - 5.81 MIL/uL   Hemoglobin 13.2 13.0 - 17.0 g/dL   HCT 40.8 39.0 - 52.0 %   MCV 96.5 80.0 - 100.0 fL   MCH 31.2 26.0 - 34.0 pg   MCHC 32.4 30.0 - 36.0 g/dL   RDW 14.0 11.5 - 15.5 %   Platelets 72 (L) 150 - 400 K/uL    Comment: REPEATED TO VERIFY SPECIMEN CHECKED FOR CLOTS Immature Platelet Fraction may be clinically indicated, consider ordering this additional test [id number]    nRBC 0.0 0.0 - 0.2 %   Neutrophils Relative % 68 %   Neutro Abs 3.5 1.7 - 7.7 K/uL   Lymphocytes Relative 13 %   Lymphs Abs 0.7 0.7 - 4.0 K/uL   Monocytes Relative 15 %   Monocytes Absolute 0.8 0.1 - 1.0 K/uL   Eosinophils Relative 3 %   Eosinophils Absolute 0.1 0.0 - 0.5 K/uL   Basophils Relative 0 %   Basophils Absolute 0.0 0.0 - 0.1 K/uL   Immature Granulocytes 1 %   Abs Immature Granulocytes 0.06 0.00 - 0.07 K/uL    Comment: Performed at [facility], [place] [address], [place], [place] [address]   No results found.  Pending Labs Unresulted Labs (From admission, onward)   None      Vitals/Pain Today's Vitals   06/01/18 0958 06/01/18 1006 06/01/18 1100  BP:  112/66 126/71  Pulse:  71 70  Resp:  18   Temp:  98.1 F (36.7 C)   TempSrc:  Oral   SpO2:  98% 99%  Weight: 80.7 kg    Height: 5\' 8"  (1.727 m)    PainSc: 8       Isolation Precautions No active isolations  Medications Medications  0.9 %  sodium chloride infusion (500 mLs Intravenous New Bag/Given 06/01/18 1049)  acetaminophen (TYLENOL) tablet 650 mg (650 mg Oral Given 06/01/18 1050)  vancomycin (VANCOCIN) IVPB 1000 mg/200 mL premix (0 mg Intravenous Stopped 06/01/18 1228)    Mobility walks

## 2018-06-02 DIAGNOSIS — Z91040 Latex allergy status: Secondary | ICD-10-CM

## 2018-06-02 DIAGNOSIS — L299 Pruritus, unspecified: Secondary | ICD-10-CM | POA: Diagnosis present

## 2018-06-02 DIAGNOSIS — M069 Rheumatoid arthritis, unspecified: Secondary | ICD-10-CM | POA: Diagnosis present

## 2018-06-02 DIAGNOSIS — C3491 Malignant neoplasm of unspecified part of right bronchus or lung: Secondary | ICD-10-CM | POA: Diagnosis not present

## 2018-06-02 DIAGNOSIS — I2699 Other pulmonary embolism without acute cor pulmonale: Secondary | ICD-10-CM

## 2018-06-02 DIAGNOSIS — Z86711 Personal history of pulmonary embolism: Secondary | ICD-10-CM | POA: Diagnosis not present

## 2018-06-02 DIAGNOSIS — I480 Paroxysmal atrial fibrillation: Secondary | ICD-10-CM

## 2018-06-02 DIAGNOSIS — L03113 Cellulitis of right upper limb: Secondary | ICD-10-CM | POA: Diagnosis present

## 2018-06-02 DIAGNOSIS — I10 Essential (primary) hypertension: Secondary | ICD-10-CM | POA: Diagnosis present

## 2018-06-02 DIAGNOSIS — Z8249 Family history of ischemic heart disease and other diseases of the circulatory system: Secondary | ICD-10-CM | POA: Diagnosis not present

## 2018-06-02 DIAGNOSIS — Z7901 Long term (current) use of anticoagulants: Secondary | ICD-10-CM | POA: Diagnosis not present

## 2018-06-02 DIAGNOSIS — Z955 Presence of coronary angioplasty implant and graft: Secondary | ICD-10-CM | POA: Diagnosis not present

## 2018-06-02 DIAGNOSIS — Z66 Do not resuscitate: Secondary | ICD-10-CM | POA: Diagnosis present

## 2018-06-02 DIAGNOSIS — D696 Thrombocytopenia, unspecified: Secondary | ICD-10-CM | POA: Diagnosis present

## 2018-06-02 DIAGNOSIS — Z881 Allergy status to other antibiotic agents status: Secondary | ICD-10-CM

## 2018-06-02 DIAGNOSIS — Z85118 Personal history of other malignant neoplasm of bronchus and lung: Secondary | ICD-10-CM

## 2018-06-02 DIAGNOSIS — I252 Old myocardial infarction: Secondary | ICD-10-CM | POA: Diagnosis not present

## 2018-06-02 DIAGNOSIS — I2510 Atherosclerotic heart disease of native coronary artery without angina pectoris: Secondary | ICD-10-CM | POA: Diagnosis present

## 2018-06-02 DIAGNOSIS — L309 Dermatitis, unspecified: Secondary | ICD-10-CM

## 2018-06-02 DIAGNOSIS — S5011XA Contusion of right forearm, initial encounter: Secondary | ICD-10-CM | POA: Diagnosis present

## 2018-06-02 DIAGNOSIS — Z96653 Presence of artificial knee joint, bilateral: Secondary | ICD-10-CM | POA: Diagnosis present

## 2018-06-02 DIAGNOSIS — Z87891 Personal history of nicotine dependence: Secondary | ICD-10-CM

## 2018-06-02 DIAGNOSIS — I482 Chronic atrial fibrillation, unspecified: Secondary | ICD-10-CM | POA: Diagnosis present

## 2018-06-02 DIAGNOSIS — Z885 Allergy status to narcotic agent status: Secondary | ICD-10-CM

## 2018-06-02 DIAGNOSIS — Z923 Personal history of irradiation: Secondary | ICD-10-CM | POA: Diagnosis not present

## 2018-06-02 DIAGNOSIS — R21 Rash and other nonspecific skin eruption: Secondary | ICD-10-CM

## 2018-06-02 DIAGNOSIS — Z888 Allergy status to other drugs, medicaments and biological substances status: Secondary | ICD-10-CM

## 2018-06-02 DIAGNOSIS — R062 Wheezing: Secondary | ICD-10-CM | POA: Diagnosis not present

## 2018-06-02 DIAGNOSIS — Z951 Presence of aortocoronary bypass graft: Secondary | ICD-10-CM | POA: Diagnosis not present

## 2018-06-02 DIAGNOSIS — E785 Hyperlipidemia, unspecified: Secondary | ICD-10-CM | POA: Diagnosis present

## 2018-06-02 LAB — CBC
HCT: 36.6 % — ABNORMAL LOW (ref 39.0–52.0)
Hemoglobin: 12.3 g/dL — ABNORMAL LOW (ref 13.0–17.0)
MCH: 31.5 pg (ref 26.0–34.0)
MCHC: 33.6 g/dL (ref 30.0–36.0)
MCV: 93.6 fL (ref 80.0–100.0)
Platelets: 63 10*3/uL — ABNORMAL LOW (ref 150–400)
RBC: 3.91 MIL/uL — ABNORMAL LOW (ref 4.22–5.81)
RDW: 13.9 % (ref 11.5–15.5)
WBC: 4.7 10*3/uL (ref 4.0–10.5)
nRBC: 0 % (ref 0.0–0.2)

## 2018-06-02 LAB — BASIC METABOLIC PANEL
Anion gap: 7 (ref 5–15)
BUN: 19 mg/dL (ref 8–23)
CO2: 21 mmol/L — ABNORMAL LOW (ref 22–32)
Calcium: 8.4 mg/dL — ABNORMAL LOW (ref 8.9–10.3)
Chloride: 108 mmol/L (ref 98–111)
Creatinine, Ser: 1.73 mg/dL — ABNORMAL HIGH (ref 0.61–1.24)
GFR calc Af Amer: 40 mL/min — ABNORMAL LOW (ref >60)
GFR calc non Af Amer: 34 mL/min — ABNORMAL LOW (ref >60)
Glucose, Bld: 95 mg/dL (ref 70–99)
Potassium: 4.4 mmol/L (ref 3.5–5.1)
Sodium: 136 mmol/L (ref 135–145)

## 2018-06-02 LAB — SEDIMENTATION RATE: Sed Rate: 18 mm/hr — ABNORMAL HIGH (ref 0–16)

## 2018-06-02 LAB — MRSA PCR SCREENING: MRSA by PCR: NEGATIVE

## 2018-06-02 MED ORDER — METHYLPREDNISOLONE SODIUM SUCC 125 MG IJ SOLR
60.0000 mg | INTRAMUSCULAR | Status: DC
  Administered 2018-06-02: 60 mg via INTRAVENOUS
  Filled 2018-06-02: qty 2

## 2018-06-02 MED ORDER — PREDNISONE 20 MG PO TABS
40.0000 mg | ORAL_TABLET | Freq: Every day | ORAL | Status: DC
  Administered 2018-06-03 – 2018-06-04 (×2): 40 mg via ORAL
  Filled 2018-06-02 (×2): qty 2

## 2018-06-02 MED ORDER — SODIUM CHLORIDE 0.9 % IV BOLUS
250.0000 mL | Freq: Once | INTRAVENOUS | Status: AC
  Administered 2018-06-02: 250 mL via INTRAVENOUS

## 2018-06-02 MED ORDER — METHYLPREDNISOLONE SODIUM SUCC 125 MG IJ SOLR
60.0000 mg | INTRAMUSCULAR | Status: AC
  Administered 2018-06-02 (×2): 60 mg via INTRAVENOUS
  Filled 2018-06-02 (×2): qty 2

## 2018-06-02 MED ORDER — CEFAZOLIN SODIUM-DEXTROSE 1-4 GM/50ML-% IV SOLN
1.0000 g | Freq: Three times a day (TID) | INTRAVENOUS | Status: DC
  Administered 2018-06-02 – 2018-06-04 (×6): 1 g via INTRAVENOUS
  Filled 2018-06-02 (×8): qty 50

## 2018-06-02 NOTE — Consult Note (Signed)
[facility]  Total days of antibiotics 1       Reason for Consult:dermatitis/cellulits    Referring Physician: smith  Principal Problem:   Cellulitis of arm, right Active Problems:   Essential hypertension   Adenocarcinoma of right lung (HCC)   Atrial fibrillation ([place])   Pulmonary embolism (HCC)   Rash and nonspecific skin eruption    [name] is an 85yo M with hx of lung cancer, PE, CAD, chronic dermatitis x 1 month. 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 Has hx of excoriation. In the past month, he has been placed on bactrim ( with hx of mrsa on culture? From unknown source) with little improvement. He has recently finished a course of prednisone. He states that his arm has not improved in addition has developed large hematoma. He is pruritic in general and has noticed since been on anticoagulation has areas that he scratches and become eschar noted on his legs however his right arm is erythematous and has a quite large superficial hematoma "feels tight like a boil"  He underwent bedside aspiration but if continued to bleed and placed compression bandage. He was started on vancomycin and piptazo  Past Medical History:  Diagnosis Date  . Adenocarcinoma of right lung ([place]) 01/06/2016  . Anginal chest pain at rest [facility])    Chronic non, controlled on Lorazepam  . Anxiety   . Arthritis    "all over"   . BPH (benign prostatic hyperplasia)   . CAD (coronary artery disease)   . Chronic lower back pain   . Complication of anesthesia    "problems making water afterwards"  . Depression   . DJD (degenerative joint disease)   . Esophageal ulcer without bleeding    bid ppi indefinitely  . Family history of adverse reaction to anesthesia    "children w/PONV"  . GERD (gastroesophageal reflux disease)   . Headache    "couple/week maybe" (09/04/2015)  . Hemochromatosis    Possible, elevated iron stores,  hook-like osteophytes on hand films, normal LFTs  . Hepatitis    "yellow jaundice as a baby"  . History of ASCVD    MULTIVESSEL  . Hyperlipidemia   . Hypertension   . Myocardial infarction ([place]) 1999  . Osteoarthritis   . Pneumonia    "several times; got a little now" (09/04/2015)  . Rheumatoid arthritis ([place])   . Subdural hematoma (HCC)    small after fall 09/2016-plavix held, neurosurgery consulted.  Asymptomatic.    Marland Kitchen 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[place]) 02/2018   on Lovenox lifelong since he failed PO Eliquis    Allergies:  Allergies  Allergen Reactions  . Feldene [Piroxicam] Other (See Comments)    REACTION: Blisters  . Statins Other (See Comments)    Myalgias  . Doxycycline Other (See Comments)    REACTION:  unknown  . Latex Rash  . Tequin Anxiety  . Valium Other (See Comments)    REACTION:  unknown     MEDICATIONS: . cycloSPORINE  1 drop Both Eyes BID  . docusate sodium  100 mg Oral BID  . [START ON 06/03/2018] enoxaparin  120 mg Subcutaneous Daily  . enoxaparin  80 mg Subcutaneous Q12H  . FLUoxetine  10 mg Oral Daily  . LORazepam  0.5 mg Oral BID  . LORazepam  1 mg Oral Daily  . methylPREDNISolone (SOLU-MEDROL) injection  60 mg Intravenous [id number]  . metoprolol succinate  25  mg Oral Daily  . pantoprazole  40 mg Oral Daily  . rosuvastatin  5 mg Oral Daily  . tamsulosin  0.4 mg Oral Daily    Social History   Tobacco Use  . Smoking status: Never Smoker  . Smokeless tobacco: Former Systems developer    Types: Chew  . Tobacco comment: "quit chewing in the 1960s"  Substance Use Topics  . Alcohol use: No  . Drug use: No    Family History  Problem Relation Age of Onset  . Arthritis-Osteo Sister   . Heart attack Brother   . Heart attack Other   . Cancer Neg Hx      Review of Systems  Constitutional: Negative for fever, chills, diaphoresis, activity change, appetite change, fatigue and unexpected weight change.  HENT: Negative for congestion, sore throat, rhinorrhea,  sneezing, trouble swallowing and sinus pressure.  Eyes: Negative for photophobia and visual disturbance.  Respiratory: Negative for cough, chest tightness, shortness of breath, wheezing and stridor.  Cardiovascular: Negative for chest pain, palpitations and leg swelling.  Gastrointestinal: Negative for nausea, vomiting, abdominal pain, diarrhea, constipation, blood in stool, abdominal distention and anal bleeding.  Genitourinary: Negative for dysuria, hematuria, flank pain and difficulty urinating.  Musculoskeletal: Negative for myalgias, back pain, joint swelling, arthralgias and gait problem.  Skin: + pruritis.  Neurological: Negative for dizziness, tremors, weakness and light-headedness.  Hematological: Negative for adenopathy. Does not bruise/bleed easily.  Psychiatric/Behavioral: Negative for behavioral problems, confusion, sleep disturbance, dysphoric mood, decreased concentration and agitation.     OBJECTIVE: Temp:  [97.4 F (36.3 C)-98.5 F (36.9 C)] 97.6 F (36.4 C) (02/29 1209) Pulse Rate:  [50-92] 71 (02/29 1209) Resp:  [15-19] 18 (02/29 1209) BP: (94-134)/(47-85) 121/61 (02/29 1209) SpO2:  [97 %-100 %] 97 % (02/29 1209) Physical Exam  Constitutional: He is oriented to person, place, and time. He appears well-developed and well-nourished. No distress.  HENT:  Mouth/Throat: Oropharynx is clear and moist. No oropharyngeal exudate.  Cardiovascular: Normal rate, regular rhythm and normal heart sounds. Exam reveals no gallop and no friction rub.  No murmur heard.  Pulmonary = Exp wheezing noted.  Abdominal: Soft. Bowel sounds are normal. He exhibits no distension. There is no tenderness. Numerous ecchymosis  From  injection Lymphadenopathy:  He has no cervical adenopathy.  Neurological: He is alert and oriented to person, place, and time.  Skin: Skin is frail with notable scarring. His right forearm is edematous erythematous with large dark bullous ? Superficial hematoma.Marland Kitchen on  his legs small eschar on right leg- numerous. Dry sckin Psychiatric: He has a normal mood and affect. His behavior is normal.     LABS: Results for orders placed or performed during the hospital encounter of 06/01/18 (from the past 48 hour(s))  Basic metabolic panel     Status: Abnormal   Collection Time: 06/01/18 10:51 AM  Result Value Ref Range   Sodium 134 (L) 135 - 145 mmol/L   Potassium 4.6 3.5 - 5.1 mmol/L   Chloride 105 98 - 111 mmol/L   CO2 23 22 - 32 mmol/L   Glucose, Bld 80 70 - 99 mg/dL   BUN 22 8 - 23 mg/dL   Creatinine, Ser 1.89 (H) 0.61 - 1.24 mg/dL   Calcium 8.9 8.9 - 10.3 mg/dL   GFR calc non Af Amer 31 (L) >60 mL/min   GFR calc Af Amer 36 (L) >60 mL/min   Anion gap 6 5 - 15    Comment: Performed at [facility],  [address], [place], [place] [address]  CBC with Differential     Status: Abnormal   Collection Time: 06/01/18 10:51 AM  Result Value Ref Range   WBC 5.2 4.0 - 10.5 K/uL   RBC 4.23 4.22 - 5.81 MIL/uL   Hemoglobin 13.2 13.0 - 17.0 g/dL   HCT 40.8 39.0 - 52.0 %   MCV 96.5 80.0 - 100.0 fL   MCH 31.2 26.0 - 34.0 pg   MCHC 32.4 30.0 - 36.0 g/dL   RDW 14.0 11.5 - 15.5 %   Platelets 72 (L) 150 - 400 K/uL    Comment: REPEATED TO VERIFY SPECIMEN CHECKED FOR CLOTS Immature Platelet Fraction may be clinically indicated, consider ordering this additional test [id number]    nRBC 0.0 0.0 - 0.2 %   Neutrophils Relative % 68 %   Neutro Abs 3.5 1.7 - 7.7 K/uL   Lymphocytes Relative 13 %   Lymphs Abs 0.7 0.7 - 4.0 K/uL   Monocytes Relative 15 %   Monocytes Absolute 0.8 0.1 - 1.0 K/uL   Eosinophils Relative 3 %   Eosinophils Absolute 0.1 0.0 - 0.5 K/uL   Basophils Relative 0 %   Basophils Absolute 0.0 0.0 - 0.1 K/uL   Immature Granulocytes 1 %   Abs Immature Granulocytes 0.06 0.00 - 0.07 K/uL    Comment: Performed at [facility], [place] [address], [place], [place] [address]  Basic metabolic panel     Status: Abnormal   Collection Time:  06/02/18  4:19 AM  Result Value Ref Range   Sodium 136 135 - 145 mmol/L   Potassium 4.4 3.5 - 5.1 mmol/L   Chloride 108 98 - 111 mmol/L   CO2 21 (L) 22 - 32 mmol/L   Glucose, Bld 95 70 - 99 mg/dL   BUN 19 8 - 23 mg/dL   Creatinine, Ser 1.73 (H) 0.61 - 1.24 mg/dL   Calcium 8.4 (L) 8.9 - 10.3 mg/dL   GFR calc non Af Amer 34 (L) >60 mL/min   GFR calc Af Amer 40 (L) >60 mL/min   Anion gap 7 5 - 15    Comment: Performed at [facility], [place] [address] [place], [place] [address]  CBC     Status: Abnormal   Collection Time: 06/02/18  4:19 AM  Result Value Ref Range   WBC 4.7 4.0 - 10.5 K/uL   RBC 3.91 (L) 4.22 - 5.81 MIL/uL   Hemoglobin 12.3 (L) 13.0 - 17.0 g/dL   HCT 36.6 (L) 39.0 - 52.0 %   MCV 93.6 80.0 - 100.0 fL   MCH 31.5 26.0 - 34.0 pg   MCHC 33.6 30.0 - 36.0 g/dL   RDW 13.9 11.5 - 15.5 %   Platelets 63 (L) 150 - 400 K/uL    Comment: REPEATED TO VERIFY Immature Platelet Fraction may be clinically indicated, consider ordering this additional test [id number] CONSISTENT WITH PREVIOUS RESULT    nRBC 0.0 0.0 - 0.2 %    Comment: Performed at [facility], [place] [address], [place], [place] [address]  Sedimentation rate     Status: Abnormal   Collection Time: 06/02/18  8:43 AM  Result Value Ref Range   Sed Rate 18 (H) 0 - 16 mm/hr    Comment: Performed at [facility] [address], [place], [place] [address]  MRSA PCR Screening     Status: None   Collection Time: 06/02/18  9:30 AM  Result Value  Ref Range   MRSA by PCR NEGATIVE NEGATIVE    Comment:        The GeneXpert MRSA Assay (FDA approved for NASAL specimens only), is one component of a comprehensive MRSA colonization surveillance program. It is not intended to diagnose MRSA infection nor to guide or monitor treatment for MRSA infections. Performed at [facility], [place] [address] [place],  [address]     MICRO:  IMAGING: Ct Chest Wo Contrast  Result Date: 06/01/2018 CLINICAL  DATA:  85 y/o M; history of right lower lobe lung cancer post radiation therapy. EXAM: CT CHEST WITHOUT CONTRAST TECHNIQUE: Multidetector CT imaging of the chest was performed following the standard protocol without IV contrast. COMPARISON:  12/22/2015 PET-CT. 02/02/2018 CT chest. FINDINGS: Cardiovascular: Stable cardiomegaly. No pericardial effusion. Status post CABG with LIMA and saphenous grafts. Severe coronary artery calcific atherosclerosis. Aortic atherosclerosis. Mediastinum/Nodes: No enlarged mediastinal or axillary lymph nodes. Thyroid gland, trachea, and esophagus demonstrate no significant findings. Previously enlarged right upper paratracheal lymph node measures 9 mm short axis on the current study (series 4, image 28). Lungs/Pleura: Band of consolidation at the right posterior lower lobe is decreased in size in comparison with the prior chest CT and compatible with post radiation fibrosis. No discrete mass identified. No new consolidation, effusion, or pneumothorax. Interval resolution of right pleural effusion. Coarse reticular opacities at the periphery of the lungs are compatible with mild fibrosis. Upper Abdomen: No acute abnormality. Musculoskeletal: Healed median sternotomy. No acute osseous abnormality identified. IMPRESSION: 1. Band of consolidation at the right posterior lower lobe is decreased in size in comparison with prior chest CT and compatible with post radiation fibrosis. No discrete mass identified on this noncontrast examination. 2. No mediastinal adenopathy. Previously enlarged right upper paratracheal lymph node is now normal in size by imaging criteria. 3. Interval resolution of right pleural effusion. Electronically Signed   By: Kristine Garbe M.D.   On: 06/01/2018 21:03    HISTORICAL MICRO/IMAGING  Assessment/Plan: questionable cellulitis but worry that bullous eruption on his forearm may need surgical intervention - recommend to narrow to cefazolin, stop  vanco - non pustular cellulitis is likely strep (for which bactrim does not have great coverage) - would try moisture barrier to his skin to see if that relieves pruritis - I suspect element of excoriation dermatitis - would recommend he follows up at [facility] ([facility] derm for second opinion)  Wheezing = continue with solumedrol

## 2018-06-02 NOTE — Progress Notes (Addendum)
Progress Note    Evan [name]  ONG:[id number] DOB: 1927/07/07  DOA: 06/01/2018 PCP: [name], MD    Brief Narrative:   Chief complaint: Infection of right arm  Medical records reviewed and are as summarized below:  Evan [name] is an 85 y.o. male with a past medical history significant for SDH 09/2016, RA, CAD s/p CABG, HTN, HLD, PNA on Lovenox right, AAA s/p repair, and lung cancer s/p radiation; who presents with complaints of worsening skin infection of the right arm after being started on Bactrim on 2/26 by his dermatologist.  Assessment/Plan:   Principal Problem:   Cellulitis of arm, right Active Problems:   Essential hypertension   Adenocarcinoma of right lung ([place])   Atrial fibrillation ([place])   Pulmonary embolism ([place])   Rash and nonspecific skin eruption  1.  Cellulitis of the right arm/rash, pruritus, right arm hematoma: Patient reported having recent wound culture positive for MRSA and presented with worsening right upper extremity cellulitis and hematoma.  Hematoma was I&D'd in the emergency department.  Patient followed by dermatology Dr. Elvera Lennox.  Skin culture on 1/24 was positive for MSSA, given Keflex and triamcinolone cream.  Return 2/3 still complaining of itchiness of the skin given prednisone and prolonged Keflex for 30 days.  Patient noted some improvement specifically with itching initially until completed steroids.  Return 2/26 with right upper extremity cellulitis and itching started on Bactrim.  Patient reported taking 3 doses of Bactrim with no improvement symptoms.  Review of records shows patient had been diagnosed with pustular dermatitis treated with vancomycin IV, switched to Augmentin p.o., and placed on steroid taper in 2017.  Suspect pruritus is driving recurrence of infection. -Check ESR(18)and MRSA nasal swab(negative) -Discontinue vancomycin and Zosyn and start Ancef per ID  -Solu-Medrol 60 mg IV, then switch to p.o. prednisone tomorrow  morning -Continue Sarna and hydroxyzine as needed -Consult ID for further recommendation -Wound care consult -Discussed discontinuation of use of electric blanket as possible cause of pruritus -Per infectious disease recommendations consulted Dr. Griffin Basil of orthopedics for possible I&D of the hematoma for risk of infection  Pulmonary embolus: Patient documented to have a PE on 02/2018 despite compliance with Eliquis.  Patient currently on lifelong Lovenox.  Plan was to switch to daily Lovenox on 3/1. -Continue Lovenox  History of lung cancer: Patient followed by Dr. 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  Patient was due for repeat CT which was obtained showing signs of post radiation fibrosis, resolution of right-sided pleural effusion, and no significant lymphadenopathy.  Essential hypertension: Blood pressure soft this morning 95/47. -Continue metoprolol with holding parameters for blood pressure and heart rate.  Atrial fibrillation: Currently rate controlled.  Patient on metoprolol and lifelong Lovenox. -Continue both as tolerated  Body mass index is 27.06 kg/m.   Family Communication/Anticipated D/C date and plan/Code Status   DVT prophylaxis: Lovenox ordered. Code Status: DNR  Family Communication: Discussed plan of care with the patient family present at bedside Disposition Plan: Likely discharge home in 2 to 3 days   Medical Consultants:    Infectious disease Dr. Baxter Flattery   Anti-Infectives:    Vancomycin day 2, Ancef day 1  Subjective:   Patient reports that he had 3 doses of Bactrim, but noticed no change in symptoms of the right arm.  Complains of itching of the left, right arm, and stomach.  Prednisone previously helped.  Family also notes use of electric blanket at home.  Family denies any recent changes in detergents or soaps prescribed.  Objective:    Vitals:   06/01/18 2313 06/02/18 0551 06/02/18 0800 06/02/18 0803  BP: (!) 96/58 134/85 (!)  95/47 96/69  Pulse: 70 71 90 92  Resp: _0 Temp: 97.7 F (36.5 C) 98.5 F (36.9 C)    TempSrc: Oral Oral    SpO2: 98% 98%    Weight:      Height:        Intake/Output Summary (Last 24 hours) at 06/02/2018 0037 Last data filed at 06/02/2018 0400 Gross per 24 hour  Intake 502.84 ml  Output -  Net 502.84 ml   Filed Weights   06/01/18 0958  Weight: 80.7 kg    Exam: Constitutional: Elderly male NAD, calm, comfortable Eyes: PERRL, lids and conjunctivae normal ENMT: Mucous membranes are moist. Posterior pharynx clear of any exudate or lesions.hard of hearing Neck: normal, supple, no masses, no thyromegaly Respiratory: clear to auscultation bilaterally, no wheezing, no crackles. Normal respiratory effort. No accessory muscle use.  Cardiovascular: Regular rate and rhythm, no murmurs / rubs / gallops. No extremity edema. 2+ pedal pulses. No carotid bruits.  Abdomen: no tenderness, no masses palpated. No hepatosplenomegaly. Bowel sounds positive.  Musculoskeletal: no clubbing / cyanosis. No joint deformity upper and lower extremities. Good ROM, no contractures. Normal muscle tone.  Skin: Erythema noted of the right arm with previous area I&D currently bandaged.  Rash noted at the right side and abdomen along with arms that is whitish appearance with flaky and excoriations present. Neurologic: CN 2-12 grossly intact. Sensation intact, DTR normal. Strength 5/5 in all 4.  Psychiatric: Normal judgment and insight. Alert and oriented x 3. Normal mood.    Data Reviewed:   I have personally reviewed following labs and imaging studies:  Labs: Labs show the following:   Basic Metabolic Panel: Recent Labs  Lab 06/01/18 1051 06/02/18 0419  NA 134* 136  K 4.6 4.4  CL 105 108  CO2 23 21*  GLUCOSE 80 95  BUN 22 19  CREATININE 1.89* 1.73*  CALCIUM 8.9 8.4*   GFR Estimated Creatinine Clearance: 28.6 mL/min (A) (by C-G formula based on SCr of 1.73 mg/dL (H)). Liver Function  Tests: No results for input(s): AST, ALT, ALKPHOS, BILITOT, PROT, ALBUMIN in the last 168 hours. No results for input(s): LIPASE, AMYLASE in the last 168 hours. No results for input(s): AMMONIA in the last 168 hours. Coagulation profile No results for input(s): INR, PROTIME in the last 168 hours.  CBC: Recent Labs  Lab 06/01/18 1051 06/02/18 0419  WBC 5.2 4.7  NEUTROABS 3.5  --   HGB 13.2 12.3*  HCT 40.8 36.6*  MCV 96.5 93.6  PLT 72* 63*   Cardiac Enzymes: No results for input(s): CKTOTAL, CKMB, CKMBINDEX, TROPONINI in the last 168 hours. BNP (last 3 results) Recent Labs    06/14/17 1010 06/21/17 1617  PROBNP 823.0* 666.0*   CBG: No results for input(s): GLUCAP in the last 168 hours. D-Dimer: No results for input(s): DDIMER in the last 72 hours. Hgb A1c: No results for input(s): HGBA1C in the last 72 hours. Lipid Profile: No results for input(s): CHOL, HDL, LDLCALC, TRIG, CHOLHDL, LDLDIRECT in the last 72 hours. Thyroid function studies: No results for input(s): TSH, T4TOTAL, T3FREE, THYROIDAB in the last 72 hours.  Invalid input(s): FREET3 Anemia work up: No results for input(s): VITAMINB12, FOLATE, FERRITIN, TIBC, IRON, RETICCTPCT in the last 72 hours. Sepsis Labs: Recent Labs  Lab 06/01/18 1051 06/02/18 0419  WBC 5.2 4.7    Microbiology No results found for this or any previous visit (from the past 240 hour(s)).  Procedures and diagnostic studies:  Ct Chest Wo Contrast  Result Date: 06/01/2018 CLINICAL DATA:  85 y/o M; history of right lower lobe lung cancer post radiation therapy. EXAM: CT CHEST WITHOUT CONTRAST TECHNIQUE: Multidetector CT imaging of the chest was performed following the standard protocol without IV contrast. COMPARISON:  12/22/2015 PET-CT. 02/02/2018 CT chest. FINDINGS: Cardiovascular: Stable cardiomegaly. No pericardial effusion. Status post CABG with LIMA and saphenous grafts. Severe coronary artery calcific atherosclerosis. Aortic  atherosclerosis. Mediastinum/Nodes: No enlarged mediastinal or axillary lymph nodes. Thyroid gland, trachea, and esophagus demonstrate no significant findings. Previously enlarged right upper paratracheal lymph node measures 9 mm short axis on the current study (series 4, image 28). Lungs/Pleura: Band of consolidation at the right posterior lower lobe is decreased in size in comparison with the prior chest CT and compatible with post radiation fibrosis. No discrete mass identified. No new consolidation, effusion, or pneumothorax. Interval resolution of right pleural effusion. Coarse reticular opacities at the periphery of the lungs are compatible with mild fibrosis. Upper Abdomen: No acute abnormality. Musculoskeletal: Healed median sternotomy. No acute osseous abnormality identified. IMPRESSION: 1. Band of consolidation at the right posterior lower lobe is decreased in size in comparison with prior chest CT and compatible with post radiation fibrosis. No discrete mass identified on this noncontrast examination. 2. No mediastinal adenopathy. Previously enlarged right upper paratracheal lymph node is now normal in size by imaging criteria. 3. Interval resolution of right pleural effusion. Electronically Signed   By: Lance  Furusawa-Stratton M.D.   On: 06/01/2018 21:03    Medications:   . cycloSPORINE  1 drop Both Eyes BID  . docusate sodium  100 mg Oral BID  . [START ON 06/03/2018] enoxaparin  120 mg Subcutaneous Daily  . enoxaparin  80 mg Subcutaneous Q12H  . FLUoxetine  10 mg Oral Daily  . LORazepam  0.5 mg Oral BID  . LORazepam  1 mg Oral Daily  . methylPREDNISolone (SOLU-MEDROL) injection  60 mg Intravenous [id number]  . metoprolol succinate  25 mg Oral Daily  . pantoprazole  40 mg Oral Daily  . rosuvastatin  5 mg Oral Daily  . tamsulosin  0.4 mg Oral Daily   Continuous Infusions: . sodium chloride 75 mL/hr at 06/01/18 1706  . piperacillin-tazobactam (ZOSYN)  IV 3.375 g (06/02/18 0625)  .  [START ON 06/03/2018] vancomycin       LOS: 0 days   Rondell A Smith  [facility]   *Please refer to [email or web address], password [email or web address] to get updated schedule on who will round on this patient, as hospitalists switch teams weekly. If 7PM-7AM, please contact night-coverage at [email or web address], password [email or web address] for any overnight needs.              

## 2018-06-02 NOTE — Progress Notes (Signed)
Informed MD via text page of pt low blood pressure  Family at bedside states he usually takes only 1/2 an ativan at bedtime MD aware, stated to hold metoprolol and okay to give morning dose of ativan  MD also ordered bolus  Educated pt and pt family on plan of care  Pt verbalizes understanding  Will continue to monitor

## 2018-06-03 LAB — COMPREHENSIVE METABOLIC PANEL
ALT: 17 U/L (ref 0–44)
AST: 21 U/L (ref 15–41)
Albumin: 2.7 g/dL — ABNORMAL LOW (ref 3.5–5.0)
Alkaline Phosphatase: 66 U/L (ref 38–126)
Anion gap: 8 (ref 5–15)
BUN: 19 mg/dL (ref 8–23)
CO2: 17 mmol/L — ABNORMAL LOW (ref 22–32)
Calcium: 8.2 mg/dL — ABNORMAL LOW (ref 8.9–10.3)
Chloride: 111 mmol/L (ref 98–111)
Creatinine, Ser: 1.43 mg/dL — ABNORMAL HIGH (ref 0.61–1.24)
GFR calc Af Amer: 50 mL/min — ABNORMAL LOW (ref >60)
GFR calc non Af Amer: 43 mL/min — ABNORMAL LOW (ref >60)
Glucose, Bld: 144 mg/dL — ABNORMAL HIGH (ref 70–99)
Potassium: 4.7 mmol/L (ref 3.5–5.1)
Sodium: 136 mmol/L (ref 135–145)
Total Bilirubin: 0.7 mg/dL (ref 0.3–1.2)
Total Protein: 5.3 g/dL — ABNORMAL LOW (ref 6.5–8.1)

## 2018-06-03 LAB — CBC
HCT: 34 % — ABNORMAL LOW (ref 39.0–52.0)
Hemoglobin: 11.2 g/dL — ABNORMAL LOW (ref 13.0–17.0)
MCH: 30.9 pg (ref 26.0–34.0)
MCHC: 32.9 g/dL (ref 30.0–36.0)
MCV: 93.9 fL (ref 80.0–100.0)
Platelets: 63 10*3/uL — ABNORMAL LOW (ref 150–400)
RBC: 3.62 MIL/uL — ABNORMAL LOW (ref 4.22–5.81)
RDW: 13.7 % (ref 11.5–15.5)
WBC: 7.1 10*3/uL (ref 4.0–10.5)
nRBC: 0 % (ref 0.0–0.2)

## 2018-06-03 LAB — GLUCOSE, CAPILLARY
Glucose-Capillary: 122 mg/dL — ABNORMAL HIGH (ref 70–99)
Glucose-Capillary: 109 mg/dL — ABNORMAL HIGH (ref 70–99)
Glucose-Capillary: 159 mg/dL — ABNORMAL HIGH (ref 70–99)
Glucose-Capillary: 149 mg/dL — ABNORMAL HIGH (ref 70–99)

## 2018-06-03 LAB — MAGNESIUM: Magnesium: 2 mg/dL (ref 1.7–2.4)

## 2018-06-03 MED ORDER — SODIUM CHLORIDE 0.9 % IV SOLN: INTRAVENOUS | Status: DC

## 2018-06-03 NOTE — Progress Notes (Signed)
PROGRESS NOTE                                                                                                                                                                                                             Patient Demographics:    Melvyn Hommes, is a 85 y.o. male, DOB - [birth year], XTG:[id number]  Admit date - 06/01/2018   Admitting Physician [name], MD  Outpatient Primary MD for the patient is Pickard, Cammie Mcgee, MD  LOS - 1  Chief Complaint  Patient presents with  . Wound Infection       Brief Narrative  MINER KORAL is an 85 y.o. male with a past medical history significant for SDH 09/2016, RA, CAD s/p CABG, HTN, HLD, PNA on Lovenox right, AAA s/p repair, and lung cancer s/p radiation; who was diagnosed with pustular dermatitis by her dermatologist 4 to 5 weeks ago, subsequently he was placed on Bactrim for suspected MRSA skin infection, this worsened his rash and he developed intense pruritus, he also developed a right arm hematoma was subsequently admitted to the hospital.  He has been seen by ID and cleared by orthopedics from the hematoma standpoint.  He currently has mild itching around his rash sites mostly in the trunk.  His right arm actually feels better.  Otherwise symptom-free.  No mucosal involvement.   Subjective:    Janice Norrie today has, No headache, No chest pain, No abdominal pain - No Nausea, No new weakness tingling or numbness, No Cough - SOB. Some itching around his belly around the rash sites.   Assessment  & Plan :     1.  Cellulitis of the right arm/rash, pruritus, right arm hematoma: Right arm hematoma has been aspirated by orthopedics bedside, also seen by ID, questionable history of MRSA skin infection in the past.  Per ID currently on cefazolin and appears nontoxic.  Continue low-dose oral prednisone.  He has no mucosal involvement.  His dermatitis is 28 weeks old and got worse with Bactrim hence we will avoid that medication.  He  will follow-up outpatient with his dermatologist Dr. Elvera Lennox.  Also recommend PCP to arrange for a second opinion with another dermatologist to see if we can get any better control of his symptoms.  Continue supportive care with Sarna cream, hydroxyzine, low-dose steroid and wound consult.  Advance activity.  If stable discharge tomorrow.  His MRSA nasal swab has been negative here.  2. Pulmonary embolus: This happened on 02/21/2018 with  being on Eliquis, with his underlying history of lung cancer he was then switched to Lovenox which she continues to take.  3. Chronic thrombocytopenia.  Monitor with caution.  No bleeding.    4. History of lung cancer: Patient followed by Dr. 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  Repeat CT scan done here shows improvement in the right-sided lung mass, pleural effusion on the right side along with lymphadenopathy in the chest..  5. Essential hypertension: Stable on home dose beta-blocker.  6. Chr. Atrial fibrillation with [place] vas 2 score of at least 2: Currently rate controlled.    Continue on beta-blocker and lifelong subcu Lovenox which he takes at home.      Family Communication  :  None present  Code Status :  DNR  Disposition Plan  :  Home 1-2 days  Consults  :  ID, Ortho  Procedures  :      DVT Prophylaxis  :  Lovenox    Lab Results  Component Value Date   PLT 63 (L) 06/03/2018    Diet :  Diet Order            Diet regular Room service appropriate? Yes; Fluid consistency: Thin  Diet effective now               Inpatient Medications Scheduled Meds: . cycloSPORINE  1 drop Both Eyes BID  . docusate sodium  100 mg Oral BID  . enoxaparin  120 mg Subcutaneous Daily  . FLUoxetine  10 mg Oral Daily  . LORazepam  0.5 mg Oral BID  . LORazepam  1 mg Oral Daily  . metoprolol succinate  25 mg Oral Daily  . pantoprazole  40 mg Oral Daily  . predniSONE  40 mg Oral Q breakfast  . rosuvastatin  5 mg Oral Daily  .  tamsulosin  0.4 mg Oral Daily   Continuous Infusions: . sodium chloride    .  ceFAZolin (ANCEF) IV Stopped (06/03/18 0700)   PRN Meds:.acetaminophen **OR** [DISCONTINUED] acetaminophen, camphor-menthol, hydrOXYzine, ondansetron **OR** [DISCONTINUED] ondansetron (ZOFRAN) IV  Antibiotics  :   Anti-infectives (From admission, onward)   Start     Dose/Rate Route Frequency Ordered Stop   06/03/18 1600  vancomycin (VANCOCIN) 1,500 mg in sodium chloride 0.9 % 500 mL IVPB  Status:  Discontinued     1,500 mg 250 mL/hr over 120 Minutes Intravenous Every 48 hours 06/01/18 1522 06/02/18 1805   06/02/18 1000  ceFAZolin (ANCEF) IVPB 1 g/50 mL premix     1 g 100 mL/hr over 30 Minutes Intravenous Every 8 hours 06/02/18 0956     06/01/18 2200  piperacillin-tazobactam (ZOSYN) IVPB 3.375 g  Status:  Discontinued     3.375 g 12.5 mL/hr over 240 Minutes Intravenous Every 8 hours 06/01/18 1522 06/02/18 0951   06/01/18 1600  vancomycin (VANCOCIN) 500 mg in sodium chloride 0.9 % 100 mL IVPB     500 mg 100 mL/hr over 60 Minutes Intravenous  Once 06/01/18 1522 06/01/18 2200   06/01/18 1430  piperacillin-tazobactam (ZOSYN) IVPB 3.375 g     3.375 g 100 mL/hr over 30 Minutes Intravenous  Once 06/01/18 1426 06/02/18 0427   06/01/18 1030  vancomycin (VANCOCIN) IVPB 1000 mg/200 mL premix     1,000 mg 200 mL/hr over 60 Minutes Intravenous  Once 06/01/18 1021 06/01/18 1228          Objective:   Vitals:   06/02/18 1658 06/02/18 1834 06/03/18 0050 06/03/18 1610  BP:  (!) 156/84 127/69 (!) 112/59  Pulse:  74 79 74  Resp: 20 18 18 18   Temp:  (!) 97.3 F (36.3 C) 97.8 F (36.6 C) 97.9 F (36.6 C)  TempSrc:  Oral  Oral  SpO2: 99% 98% 95% 91%  Weight:      Height:        Wt Readings from Last 3 Encounters:  06/01/18 80.7 kg  05/14/18 82.1 kg  05/08/18 81.2 kg     Intake/Output Summary (Last 24 hours) at 06/03/2018 0903 Last data filed at 06/03/2018 0810 Gross per 24 hour  Intake 1442 ml  Output 475  ml  Net 967 ml     Physical Exam  Awake Alert, Oriented X 3, No new F.N deficits, Normal affect [place].AT,PERRAL Supple Neck,No JVD, No cervical lymphadenopathy appriciated.  Symmetrical Chest wall movement, Good air movement bilaterally, CTAB RRR,No Gallops,Rubs or new Murmurs, No Parasternal Heave +ve B.Sounds, Abd Soft, No tenderness, No organomegaly appriciated, No rebound - guarding or rigidity. No Cyanosis, Clubbing or edema, diffuse macular itchy rash all over except face, mostly R arm and trunk    Data Review:    CBC Recent Labs  Lab 06/01/18 1051 06/02/18 0419 06/03/18 0721  WBC 5.2 4.7 7.1  HGB 13.2 12.3* 11.2*  HCT 40.8 36.6* 34.0*  PLT 72* 63* 63*  MCV 96.5 93.6 93.9  MCH 31.2 31.5 30.9  MCHC 32.4 33.6 32.9  RDW 14.0 13.9 13.7  LYMPHSABS 0.7  --   --   MONOABS 0.8  --   --   EOSABS 0.1  --   --   BASOSABS 0.0  --   --     Chemistries  Recent Labs  Lab 06/01/18 1051 06/02/18 0419 06/03/18 0721  NA 134* 136 136  K 4.6 4.4 4.7  CL 105 108 111  CO2 23 21* 17*  GLUCOSE 80 95 144*  BUN 22 19 19   CREATININE 1.89* 1.73* 1.43*  CALCIUM 8.9 8.4* 8.2*  MG  --   --  2.0  AST  --   --  21  ALT  --   --  17  ALKPHOS  --   --  66  BILITOT  --   --  0.7   ------------------------------------------------------------------------------------------------------------------ No results for input(s): CHOL, HDL, LDLCALC, TRIG, CHOLHDL, LDLDIRECT in the last 72 hours.  Lab Results  Component Value Date   HGBA1C 5.3 12/02/2016   ------------------------------------------------------------------------------------------------------------------ No results for input(s): TSH, T4TOTAL, T3FREE, THYROIDAB in the last 72 hours.  Invalid input(s): FREET3 ------------------------------------------------------------------------------------------------------------------ No results for input(s): VITAMINB12, FOLATE, FERRITIN, TIBC, IRON, RETICCTPCT in the last 72  hours.  Coagulation profile No results for input(s): INR, PROTIME in the last 168 hours.  No results for input(s): DDIMER in the last 72 hours.  Cardiac Enzymes No results for input(s): CKMB, TROPONINI, MYOGLOBIN in the last 168 hours.  Invalid input(s): CK ------------------------------------------------------------------------------------------------------------------    Component Value Date/Time   BNP 605.2 (H) 02/02/2018 0537    Micro Results Recent Results (from the past 240 hour(s))  MRSA PCR Screening     Status: None   Collection Time: 06/02/18  9:30 AM  Result Value Ref Range Status   MRSA by PCR NEGATIVE NEGATIVE Final    Comment:        The GeneXpert MRSA Assay (FDA approved for NASAL specimens only), is one component of a comprehensive MRSA colonization surveillance program. It is not intended to diagnose MRSA infection nor to guide or monitor  treatment for MRSA infections. Performed at [facility], [place] [address] [place], [place] [address]     Radiology Reports Ct Chest Wo Contrast  Result Date: 06/01/2018 CLINICAL DATA:  85 y/o M; history of right lower lobe lung cancer post radiation therapy. EXAM: CT CHEST WITHOUT CONTRAST TECHNIQUE: Multidetector CT imaging of the chest was performed following the standard protocol without IV contrast. COMPARISON:  12/22/2015 PET-CT. 02/02/2018 CT chest. FINDINGS: Cardiovascular: Stable cardiomegaly. No pericardial effusion. Status post CABG with LIMA and saphenous grafts. Severe coronary artery calcific atherosclerosis. Aortic atherosclerosis. Mediastinum/Nodes: No enlarged mediastinal or axillary lymph nodes. Thyroid gland, trachea, and esophagus demonstrate no significant findings. Previously enlarged right upper paratracheal lymph node measures 9 mm short axis on the current study (series 4, image 28). Lungs/Pleura: Band of consolidation at the right posterior lower lobe is decreased in size in comparison with the  prior chest CT and compatible with post radiation fibrosis. No discrete mass identified. No new consolidation, effusion, or pneumothorax. Interval resolution of right pleural effusion. Coarse reticular opacities at the periphery of the lungs are compatible with mild fibrosis. Upper Abdomen: No acute abnormality. Musculoskeletal: Healed median sternotomy. No acute osseous abnormality identified. IMPRESSION: 1. Band of consolidation at the right posterior lower lobe is decreased in size in comparison with prior chest CT and compatible with post radiation fibrosis. No discrete mass identified on this noncontrast examination. 2. No mediastinal adenopathy. Previously enlarged right upper paratracheal lymph node is now normal in size by imaging criteria. 3. Interval resolution of right pleural effusion. Electronically Signed   By: Kristine Garbe M.D.   On: 06/01/2018 21:03    Time Spent in minutes  30   Lala Lund M.D on 06/03/2018 at 9:03 AM  To page go to [email or web address] - password [facility]

## 2018-06-03 NOTE — Evaluation (Signed)
Physical Therapy Evaluation Patient Details Name: Evan [name] MRN: [id number] DOB: 1927-08-20 Today's Date: 06/03/2018   History of Present Illness  Pt is a 85 y.o. M with significant PMH of SDH 09/2016, RA, CAD s/p CABG, AAA s/p repair, PE, lung cancer s/p radiation who presents with cellulitis/pruritus of right arm and subsequent right arm hematoma.   Clinical Impression  Pt admitted with above diagnosis. Pt currently with functional limitations due to the deficits listed below (see PT Problem List). Prior to admission, pt lives with his daughter and is independent with mobility/ADL's. On PT evaluation, pt verbalizing no right forearm pain pain and presents with decreased functional mobility secondary to dynamic balance impairments. Pt ambulating 350 feet with no assistive device and min guard assist. Upon return, DOE 2/4, SpO2 94% on RA. Able to perform high level balance activities with compensations noted. Recommended use of cane vs. Walking stick for negotiating unlevel surfaces. Also recommended outpatient PT for further balance training and strengthening. Will follow acutely.     Follow Up Recommendations Outpatient PT;Supervision for mobility/OOB    Equipment Recommendations  None recommended by PT    Recommendations for Other Services       Precautions / Restrictions Precautions Precautions: Fall Restrictions Weight Bearing Restrictions: No      Mobility  Bed Mobility               General bed mobility comments: OOB in chair  Transfers Overall transfer level: Modified independent Equipment used: None             General transfer comment: No physical assistance required  Ambulation/Gait Ambulation/Gait assistance: Min guard Gait Distance (Feet): 350 Feet Assistive device: None Gait Pattern/deviations: Step-through pattern;Narrow base of support;Decreased stride length;Scissoring Gait velocity: decreased   General Gait Details: Pt requiring min guard due  to dynamic instability. Able to perform high level balance activities i.e. head turns, stops/starts, stepping over/around obstacles without overt LOB. Did fatigue towards end of walk with associated scissoring but pt able to self correct  Stairs            Wheelchair Mobility    Modified Rankin (Stroke Patients Only)       Balance Overall balance assessment: Needs assistance Sitting-balance support: Feet supported Sitting balance-[name] Scale: Good     Standing balance support: No upper extremity supported;During functional activity Standing balance-[name] Scale: Good               High level balance activites: Turns;Sudden stops;Head turns;Direction changes High Level Balance Comments: Min guard provided for balance             Pertinent Vitals/Pain Pain Assessment: No/denies pain    Home Living Family/patient expects to be discharged to:: Private residence Living Arrangements: Children(son) Available Help at Discharge: Family;Available 24 hours/day Type of Home: House Home Access: Stairs to enter   [employer] of Steps: 4 Home Layout: Able to live on main level with bedroom/bathroom;Laundry or work area in [place]: Environmental consultant - 2 wheels;Shower seat;Cane - single point      Prior Function Level of Independence: Independent         Comments: Pt has been receiving HHPT, denies falls. Drives occasionally     Hand Dominance   Dominant Hand: Right    Extremity/Trunk Assessment   Upper Extremity Assessment Upper Extremity Assessment: Overall WFL for tasks assessed    Lower Extremity Assessment Lower Extremity Assessment: Overall WFL for tasks assessed    Cervical / Trunk Assessment  Cervical / Trunk Assessment: Kyphotic  Communication   Communication: HOH  Cognition Arousal/Alertness: Awake/alert Behavior During Therapy: WFL for tasks assessed/performed Overall Cognitive Status: Within Functional Limits for tasks  assessed                                        General Comments      Exercises     Assessment/Plan    PT Assessment Patient needs continued PT services  PT Problem List Decreased strength;Decreased activity tolerance;Decreased mobility;Decreased balance       PT Treatment Interventions Gait training;Stair training;Functional mobility training;Therapeutic exercise;Therapeutic activities;Balance training;Patient/family education    PT Goals (Current goals can be found in the Care Plan section)  Acute Rehab PT Goals Patient Stated Goal: "be independent." PT Goal Formulation: With patient Time For Goal Achievement: 06/17/18 Potential to Achieve Goals: Good    Frequency Min 3X/week   Barriers to discharge        Co-evaluation               AM-PAC PT "6 Clicks" Mobility  Outcome Measure Help needed turning from your back to your side while in a flat bed without using bedrails?: None Help needed moving from lying on your back to sitting on the side of a flat bed without using bedrails?: None Help needed moving to and from a bed to a chair (including a wheelchair)?: A Little Help needed standing up from a chair using your arms (e.g., wheelchair or bedside chair)?: None Help needed to walk in hospital room?: A Little Help needed climbing 3-5 steps with a railing? : A Little 6 Click Score: 21    End of Session   Activity Tolerance: Patient tolerated treatment well Patient left: in chair;with call bell/phone within reach;with family/visitor present Nurse Communication: Mobility status PT Visit Diagnosis: Unsteadiness on feet (R26.81)    Time: 3491-7915 PT Time Calculation (min) (ACUTE ONLY): 15 min   Charges:   PT Evaluation $PT Eval Low Complexity: McIntosh, PT, DPT Acute Rehabilitation Services Pager [phone] Office [phone]   Willy Eddy 06/03/2018, 1:38 PM

## 2018-06-03 NOTE — Progress Notes (Signed)
[facility]    Date of Admission:  06/01/2018   Total days of antibiotics 2   ID: Evan [name] is a 85 y.o. male with  Principal Problem:   Cellulitis of arm, right Active Problems:   Essential hypertension   Adenocarcinoma of right lung (HCC)   Atrial fibrillation (HCC)   Pulmonary embolism (HCC)   Rash and nonspecific skin eruption    Subjective: Afebrile, still feels itchiness. Had aspiration of hematoma  Medications:  . cycloSPORINE  1 drop Both Eyes BID  . docusate sodium  100 mg Oral BID  . enoxaparin  120 mg Subcutaneous Daily  . FLUoxetine  10 mg Oral Daily  . LORazepam  0.5 mg Oral BID  . LORazepam  1 mg Oral Daily  . metoprolol succinate  25 mg Oral Daily  . pantoprazole  40 mg Oral Daily  . predniSONE  40 mg Oral Q breakfast  . rosuvastatin  5 mg Oral Daily  . tamsulosin  0.4 mg Oral Daily    Objective: Vital signs in last 24 hours: BP 127/70 (BP Location: Left Arm)   Pulse (!) 58   Temp (!) 97.3 F (36.3 C)   Resp 20   Ht 5\' 8"  (1.727 m)   Wt 80.7 kg   SpO2 97%   BMI 27.06 kg/m  gen = a xo by 3 in nad Ext =right fore arm wrapped Lab Results Recent Labs    06/02/18 0419 06/03/18 0721  WBC 4.7 7.1  HGB 12.3* 11.2*  HCT 36.6* 34.0*  NA 136 136  K 4.4 4.7  CL 108 111  CO2 21* 17*  BUN 19 19  CREATININE 1.73* 1.43*   Liver Panel Recent Labs    06/03/18 0721  PROT 5.3*  ALBUMIN 2.7*  AST 21  ALT 17  ALKPHOS 66  BILITOT 0.7   Sedimentation Rate Recent Labs    06/02/18 0843  ESRSEDRATE 18*    Studies/Results: Ct Chest Wo Contrast  Result Date: 06/01/2018 CLINICAL DATA:  85 y/o M; history of right lower lobe lung cancer post radiation therapy. EXAM: CT CHEST WITHOUT CONTRAST TECHNIQUE: Multidetector CT imaging of the chest was performed following the standard protocol without IV contrast. COMPARISON:  12/22/2015 PET-CT. 02/02/2018 CT chest. FINDINGS: Cardiovascular: Stable cardiomegaly. No pericardial  effusion. Status post CABG with LIMA and saphenous grafts. Severe coronary artery calcific atherosclerosis. Aortic atherosclerosis. Mediastinum/Nodes: No enlarged mediastinal or axillary lymph nodes. Thyroid gland, trachea, and esophagus demonstrate no significant findings. Previously enlarged right upper paratracheal lymph node measures 9 mm short axis on the current study (series 4, image 28). Lungs/Pleura: Band of consolidation at the right posterior lower lobe is decreased in size in comparison with the prior chest CT and compatible with post radiation fibrosis. No discrete mass identified. No new consolidation, effusion, or pneumothorax. Interval resolution of right pleural effusion. Coarse reticular opacities at the periphery of the lungs are compatible with mild fibrosis. Upper Abdomen: No acute abnormality. Musculoskeletal: Healed median sternotomy. No acute osseous abnormality identified. IMPRESSION: 1. Band of consolidation at the right posterior lower lobe is decreased in size in comparison with prior chest CT and compatible with post radiation fibrosis. No discrete mass identified on this noncontrast examination. 2. No mediastinal adenopathy. Previously enlarged right upper paratracheal lymph node is now normal in size by imaging criteria. 3. Interval resolution of right pleural effusion. Electronically Signed   By: Kristine Garbe M.D.   On: 06/01/2018 21:03  Assessment/Plan: Questionable cellulitis = would recommend to stop antibiotics, not convinced he has cellulitis.   Right forearm hematoma =appreciate ortho evaluation. Would re-evaluate his arm to see if it has recumulated after aspiraiton.  Pruritis = consider atarax trial  Will sign off  [facility] for Infectious Diseases Cell: [phone] Pager: [phone]  06/03/2018, 3:25 PM

## 2018-06-03 NOTE — Consult Note (Signed)
ORTHOPAEDIC CONSULTATION  REQUESTING PHYSICIAN: Thurnell Lose, MD  Chief Complaint: Right forearm hematoma  HPI: Evan [name] is a 85 y.o. male with 1 week of right forearm hematoma that has been changing in size.  He feels that it is been drained once before and has not made much progress.  He is worried about its size.  Infectious disease been consulted on this patient due to cellulitis andrequested orthopedic evaluation.  He is no pain in this area.  Past Medical History:  Diagnosis Date  . Adenocarcinoma of right lung ([place]) 01/06/2016  . Anginal chest pain at rest [facility])    Chronic non, controlled on Lorazepam  . Anxiety   . Arthritis    "all over"   . BPH (benign prostatic hyperplasia)   . CAD (coronary artery disease)   . Chronic lower back pain   . Complication of anesthesia    "problems making water afterwards"  . Depression   . DJD (degenerative joint disease)   . Esophageal ulcer without bleeding    bid ppi indefinitely  . Family history of adverse reaction to anesthesia    "children w/PONV"  . GERD (gastroesophageal reflux disease)   . Headache    "couple/week maybe" (09/04/2015)  . Hemochromatosis    Possible, elevated iron stores, hook-like osteophytes on hand films, normal LFTs  . Hepatitis    "yellow jaundice as a baby"  . History of ASCVD    MULTIVESSEL  . Hyperlipidemia   . Hypertension   . Myocardial infarction ([place]) 1999  . Osteoarthritis   . Pneumonia    "several times; got a little now" (09/04/2015)  . Rheumatoid arthritis ([place])   . Subdural hematoma ([facility])    small after fall 09/2016-plavix held, neurosurgery consulted.  Asymptomatic.    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 ARM SKIN LESION BIOPSY / EXCISION Left 09/02/2015  . CATARACT EXTRACTION W/ INTRAOCULAR LENS  IMPLANT, BILATERAL Bilateral   . CHOLECYSTECTOMY OPEN    . CORNEAL  TRANSPLANT Bilateral    "one at [facility]; one at [facility]"  . CORONARY ANGIOPLASTY WITH STENT PLACEMENT    . CORONARY ARTERY BYPASS GRAFT  1999  . DESCENDING AORTIC ANEURYSM REPAIR W/ STENT     DE stent ostium into the right radial free graft at OM-, 02-2007  . ESOPHAGOGASTRODUODENOSCOPY N/A 11/09/2014   Procedure: ESOPHAGOGASTRODUODENOSCOPY (EGD);  Surgeon: [name], MD;  Location: [facility] ENDOSCOPY;  Service: Endoscopy;  Laterality: N/A;  . INGUINAL HERNIA REPAIR    . JOINT REPLACEMENT    . LEFT HEART CATH AND CORS/GRAFTS ANGIOGRAPHY N/A 06/27/2017   Procedure: LEFT HEART CATH AND CORS/GRAFTS ANGIOGRAPHY;  Surgeon: [name], MD;  Location: [facility];  Service: Cardiovascular;  Laterality: N/A;  . NASAL SINUS SURGERY    . SHOULDER OPEN ROTATOR CUFF REPAIR Right   . TOTAL KNEE ARTHROPLASTY Bilateral    Social History   Socioeconomic History  . Marital status: Widowed    Spouse name: Not on file  . Number of children: Not on file  . Years of education: Not on file  . Highest education level: Not on file  Occupational History  . Not on file  Social Needs  . Financial resource strain: Not on file  . Food insecurity:    Worry: Not on file    Inability: Not on file  . Transportation  needs:    Medical: Not on file    Non-medical: Not on file  Tobacco Use  . Smoking status: Never Smoker  . Smokeless tobacco: Former Systems developer    Types: Chew  . Tobacco comment: "quit chewing in the 1960s"  Substance and Sexual Activity  . Alcohol use: No  . Drug use: No  . Sexual activity: Never  Lifestyle  . Physical activity:    Days per week: Not on file    Minutes per session: Not on file  . Stress: Not on file  Relationships  . Social connections:    Talks on phone: Not on file    Gets together: Not on file    Attends religious service: Not on file    Active member of club or organization: Not on file    Attends meetings of clubs or organizations: Not on file    Relationship status:  Not on file  Other Topics Concern  . Not on file  Social History Narrative   01-05-18 Unable to ask abuse questions family with him today.   11-012-19 Unable to ask abuse questions family with him today.   Family History  Problem Relation Age of Onset  . Arthritis-Osteo Sister   . Heart attack Brother   . Heart attack Other   . Cancer Neg Hx    Allergies  Allergen Reactions  . Feldene [Piroxicam] Other (See Comments)    REACTION: Blisters  . Statins Other (See Comments)    Myalgias  . Doxycycline Other (See Comments)    REACTION:  unknown  . Latex Rash  . Tequin Anxiety  . Valium Other (See Comments)    REACTION:  unknown   Prior to Admission medications   Medication Sig Start Date End Date Taking? Authorizing Provider  acetaminophen (TYLENOL) 500 MG tablet Take 1,000 mg by mouth every 6 (six) hours as needed for mild pain.   Yes [provider]  Cholecalciferol 2000 units TABS Take 1 tablet (2,000 Units total) by mouth daily. 02/04/16  Yes [name], MD  clobetasol cream (TEMOVATE) 9.37 % Apply 1 application topically 2 (two) times daily. 04/20/18  Yes [name], MD  enoxaparin (LOVENOX) 80 MG/0.8ML injection Inject 80 mg into the skin every 12 (twelve) hours.   Yes [provider]  ENSURE (ENSURE) Take 237 mLs by mouth 2 (two) times daily between meals.   Yes [provider]  FLUoxetine (PROZAC) 10 MG capsule Take 1 capsule (10 mg total) by mouth daily. 03/15/18  Yes [name], MD  furosemide (LASIX) 20 MG tablet Take 1 tablet (20 mg total) once a day. Take an extra tablet (20 mg total) for increased swelling, weight gain, or shortness of breath. 01/16/18  Yes Jettie Booze, MD  Lifitegrast Shirley Friar) 5 % SOLN Place 1-2 drops into both eyes daily.    Yes [provider]  LORazepam (ATIVAN) 1 MG tablet Take 1 tablet (1 mg total) by mouth every 8 (eight) hours as needed for anxiety. Patient taking differently: Take  0.5-1 mg by mouth See admin instructions. Taking 33m in the AM, 0.5565m(1/2 tablet) at lunch and 1/2 tablet (0.65m9mat bedtime. 04/03/18  Yes PicSusy FrizzleD  magnesium oxide (MAG-OX) 400 (241.3 Mg) MG tablet Take 1 tablet (400 mg total) by mouth 2 (two) times daily. 04/02/18  Yes PicSusy FrizzleD  metoprolol succinate (TOPROL-XL) 25 MG 24 hr tablet TAKE ONE (1) TABLET BY MOUTH EVERY DAY 04/02/18  Yes [name], MD  mupirocin ointment (BACTROBAN) 2 % Apply 1 application topically 2 (two) times daily. 05/30/18  Yes [provider]  nitroGLYCERIN (NITROSTAT) 0.4 MG SL tablet Place 1 tablet (0.4 mg total) under the tongue every 5 (five) minutes as needed for chest pain. 12/06/17  Yes Daune Perch, NP  pantoprazole (PROTONIX) 40 MG tablet Take one (1) tablet (40 mg) by mouth daily. 02/21/17  Yes Jettie Booze, MD  rosuvastatin (CRESTOR) 5 MG tablet TAKE 5 mg  EVERY DAY 07/07/17  Yes Jettie Booze, MD  sulfamethoxazole-trimethoprim (BACTRIM DS,SEPTRA DS) 800-160 MG tablet Take 1 tablet by mouth 2 (two) times daily. Started on 05-30-18 DS 20 05/30/18  Yes [provider]  tamsulosin (FLOMAX) 0.4 MG CAPS capsule TAKE 1 CAPSULE EVERY DAY 10/18/17  Yes [name], MD  enoxaparin (LOVENOX) 120 MG/0.8ML injection Inject 0.8 mLs (120 mg total) into the skin daily. 05/31/18   [name], MD   Ct Chest Wo Contrast  Result Date: 06/01/2018 CLINICAL DATA:  85 y/o M; history of right lower lobe lung cancer post radiation therapy. EXAM: CT CHEST WITHOUT CONTRAST TECHNIQUE: Multidetector CT imaging of the chest was performed following the standard protocol without IV contrast. COMPARISON:  12/22/2015 PET-CT. 02/02/2018 CT chest. FINDINGS: Cardiovascular: Stable cardiomegaly. No pericardial effusion. Status post CABG with LIMA and saphenous grafts. Severe coronary artery calcific atherosclerosis. Aortic atherosclerosis. Mediastinum/Nodes: No enlarged mediastinal or  axillary lymph nodes. Thyroid gland, trachea, and esophagus demonstrate no significant findings. Previously enlarged right upper paratracheal lymph node measures 9 mm short axis on the current study (series 4, image 28). Lungs/Pleura: Band of consolidation at the right posterior lower lobe is decreased in size in comparison with the prior chest CT and compatible with post radiation fibrosis. No discrete mass identified. No new consolidation, effusion, or pneumothorax. Interval resolution of right pleural effusion. Coarse reticular opacities at the periphery of the lungs are compatible with mild fibrosis. Upper Abdomen: No acute abnormality. Musculoskeletal: Healed median sternotomy. No acute osseous abnormality identified. IMPRESSION: 1. Band of consolidation at the right posterior lower lobe is decreased in size in comparison with prior chest CT and compatible with post radiation fibrosis. No discrete mass identified on this noncontrast examination. 2. No mediastinal adenopathy. Previously enlarged right upper paratracheal lymph node is now normal in size by imaging criteria. 3. Interval resolution of right pleural effusion. Electronically Signed   By: Kristine Garbe M.D.   On: 06/01/2018 21:03   Family History Reviewed and non-contributory, no pertinent history of problems with bleeding or anesthesia      Review of Systems 14 system ROS conducted and negative except for that noted in HPI   OBJECTIVE  Vitals: Patient Vitals for the past 8 hrs:  BP Temp Temp src Pulse Resp SpO2  06/03/18 0554 (!) 112/59 97.9 F (36.6 C) Oral 74 18 91 %   General: Alert, no acute distress Cardiovascular: Warm extremities noted Respiratory: No cyanosis, no use of accessory musculature GI: No organomegaly, abdomen is soft and non-tender Skin: No lesions in the area of chief complaint other than those listed below in MSK exam.  Neurologic: Sensation intact distally save for the below mentioned MSK  exam Psychiatric: Patient is competent for consent with normal mood and affect Lymphatic: No swelling obvious and reported other than the area involved in the exam below Extremities  RUE: Distal motor and sensory function completely preserved.  He has some redness and some chronic skin changes  of his forearm.  Around the large 4 x 4 centimeter bulbous lesion on the dorsal forearm there is no obvious redness nontender palpation appears to be a hematoma.  No pain with palpation elsewhere in the arm.    Test Results Imaging No pertinent.  Labs cbc Recent Labs    06/02/18 0419 06/03/18 0721  WBC 4.7 7.1  HGB 12.3* 11.2*  HCT 36.6* 34.0*  PLT 63* 63*    Labs inflam No results for input(s): CRP in the last 72 hours.  Invalid input(s): ESR  Labs coag No results for input(s): INR, PTT in the last 72 hours.  Invalid input(s): PT  Recent Labs    06/02/18 0419 06/03/18 0721  NA 136 136  K 4.4 4.7  CL 108 111  CO2 21* 17*  GLUCOSE 95 144*  BUN 19 19  CREATININE 1.73* 1.43*  CALCIUM 8.4* 8.2*     ASSESSMENT AND PLAN: 85 y.o. male with the following: Right forearm hematoma  No clinical signs of infection of this hematoma however because of infectious disease request as well as the patient's request we felt that an aspiration was appropriate.  He understands that there is a potential for a superinfection after the procedure and it may reaccumulate.  Controlling his anticoagulants postop is the best way to control this.  A pressure dressing also help.  No indication for debridement.  Procedure note under sterile conditions a 18-gauge needle was used to aspirate 30 cc of blood from the hematoma.  A pressure dressing was placed.  Patient tolerated well.

## 2018-06-04 LAB — GLUCOSE, CAPILLARY: Glucose-Capillary: 112 mg/dL — ABNORMAL HIGH (ref 70–99)

## 2018-06-04 LAB — PROTIME-INR
INR: 1.1 (ref 0.8–1.2)
Prothrombin Time: 14.4 seconds (ref 11.4–15.2)

## 2018-06-04 MED ORDER — CEPHALEXIN 500 MG PO CAPS: 500.0000 mg | ORAL_CAPSULE | Freq: Three times a day (TID) | ORAL | 0 refills | Status: DC

## 2018-06-04 MED ORDER — CAMPHOR-MENTHOL 0.5-0.5 % EX LOTN: TOPICAL_LOTION | CUTANEOUS | 0 refills | Status: DC | PRN

## 2018-06-04 MED ORDER — HYDROXYZINE HCL 25 MG PO TABS: 25.0000 mg | ORAL_TABLET | Freq: Three times a day (TID) | ORAL | 0 refills | Status: DC | PRN

## 2018-06-04 MED ORDER — SODIUM CHLORIDE 0.9 % IV SOLN: INTRAVENOUS | Status: DC | PRN

## 2018-06-04 MED ORDER — PREDNISONE 5 MG PO TABS: ORAL_TABLET | ORAL | 0 refills | Status: DC

## 2018-06-04 MED ORDER — ENOXAPARIN SODIUM 80 MG/0.8ML ~~LOC~~ SOLN: 80.0000 mg | Freq: Two times a day (BID) | SUBCUTANEOUS | Status: DC

## 2018-06-04 NOTE — Discharge Summary (Signed)
[name]:[id number] DOB: [birth year] DOA: 06/01/2018  PCP: [name], MD  Admit date: 06/01/2018  Discharge date: 06/04/2018  Admitted From: Home   Disposition:  Home   Recommendations for Outpatient Follow-up:   Follow up with PCP in 1-2 weeks  PCP Please obtain BMP/CBC, 2 view CXR in 1week,  (see Discharge instructions)   PCP Please follow up on the following pending results:    Home Health: RN   Equipment/Devices: None  Consultations: Ortho, ID Discharge Condition: Stable   CODE STATUS: Full   Diet Recommendation: Heart Healthy      Chief Complaint  Patient presents with  . Wound Infection     Brief history of present illness from the day of admission and additional interim summary    Evan [name] an 85 y.o.malewith a past medical history significant for SDH 09/2016, RA, CADs/p CABG, HTN,HLD, PNA on Lovenox right, AAA s/p repair,and lung cancer s/p radiation; who was diagnosed with pustular dermatitis by her dermatologist 4 to 5 weeks ago, subsequently he was placed on Bactrim for suspected MRSA skin infection, this worsened his rash and he developed intense pruritus, he also developed a right arm hematoma was subsequently admitted to the hospital.  He has been seen by ID and cleared by orthopedics from the hematoma standpoint.  He currently has mild itching around his rash sites mostly in the trunk.  His right arm actually feels better.  Otherwise symptom-free.  No mucosal involvement.                                                                 Hospital Course      1. Cellulitis of the right arm/rash, pruritus, right arm hematoma: Right arm hematoma has been aspirated by orthopedics bedside, also seen by ID, questionable history of MRSA skin infection in the past.  Per ID he was  placed on cefazolin and he did fine, nontoxic-appearing, will be given 5 more days of oral Keflex upon discharge.  Continue low-dose oral prednisone orally with gradual taper in the outpatient setting.  He has no mucosal involvement.  His dermatitis is 26 weeks old and got worse with Bactrim hence we will avoid that medication. He will follow-up outpatient with his dermatologist Dr. Elvera Lennox, may be worth getting a second opinion at a tertiary level facility will defer this to PCP.  Discussed with orthopedic surgeon Dr Griffin Basil, patient stable for discharge he will evaluate the patient prior to discharge as his hematoma has reoccurred mildly, no recurrent aspiration needed as this can lead to introduction of infection.  Will skip Lovenox for 24 hours today and requested patient to keep his right arm elevated at home, also home RN arranged for wound care and pressure dressing.  2. Pulmonary embolus: This happened on 02/21/2018  with being on Eliquis, with his underlying history of lung cancer he was then switched to Lovenox which he takes, will skip 24 hours today to allow for the hematoma to heal.  3. Chronic thrombocytopenia.  Monitor with caution.  No bleeding.    4. History of lung cancer: Patient followed by Dr. 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  Repeat CT scan done here shows improvement in the right-sided lung mass, pleural effusion on the right side along with lymphadenopathy in the chest.  5. Essential hypertension: Stable on home dose beta-blocker.  6. Chr. Atrial fibrillation with [place] vas 2 score of at least 2: Currently rate controlled.   Continue on beta-blocker and lifelong subcu Lovenox which he takes at home.   Discharge diagnosis     Principal Problem:   Cellulitis of arm, right Active Problems:   Essential hypertension   Adenocarcinoma of right lung (HCC)   Atrial fibrillation (HCC)   Pulmonary embolism (HCC)   Rash and nonspecific skin  eruption    Discharge instructions    Discharge Instructions    Diet - low sodium heart healthy   Complete by:  As directed    Discharge instructions   Complete by:  As directed    Keep your right arm elevated with 3-4 pillows when you are in bed or sitting in chair, keep your right arm wound clean and dry at all times.    Follow with Primary MD [name], MD in 7 days   Get CBC, CMP  checked  by Primary MD in 5-7 days   Activity: As tolerated with Full fall precautions use walker/cane & assistance as needed  Disposition Home    Diet: Heart Healthy    Special Instructions: If you have smoked or chewed Tobacco  in the last 2 yrs please stop smoking, stop any regular Alcohol  and or any Recreational drug use.  On your next visit with your primary care physician please Get Medicines reviewed and adjusted.  Please request your Prim.MD to go over all Hospital Tests and Procedure/Radiological results at the follow up, please get all Hospital records sent to your Prim MD by signing hospital release before you go home.  If you experience worsening of your admission symptoms, develop shortness of breath, life threatening emergency, suicidal or homicidal thoughts you must seek medical attention immediately by calling 911 or calling your MD immediately  if symptoms less severe.  You Must read complete instructions/literature along with all the possible adverse reactions/side effects for all the Medicines you take and that have been prescribed to you. Take any new Medicines after you have completely understood and accpet all the possible adverse reactions/side effects.   Increase activity slowly   Complete by:  As directed       Discharge Medications   Allergies as of 06/04/2018      Reactions   Feldene [piroxicam] Other (See Comments)   REACTION: Blisters   Statins Other (See Comments)   Myalgias   Doxycycline Other (See Comments)   REACTION:  unknown   Latex Rash   Tequin  Anxiety   Valium Other (See Comments)   REACTION:  unknown      Medication List    STOP taking these medications   sulfamethoxazole-trimethoprim 800-160 MG tablet Commonly known as:  BACTRIM DS,SEPTRA DS     TAKE these medications   acetaminophen 500 MG tablet Commonly known as:  TYLENOL Take 1,000 mg by mouth every 6 (six)  hours as needed for mild pain.   camphor-menthol lotion Commonly known as:  SARNA Apply topically as needed for itching.   cephALEXin 500 MG capsule Commonly known as:  KEFLEX Take 1 capsule (500 mg total) by mouth 3 (three) times daily for 5 days.   Cholecalciferol 50 MCG (2000 UT) Tabs Take 1 tablet (2,000 Units total) by mouth daily.   clobetasol cream 0.05 % Commonly known as:  TEMOVATE Apply 1 application topically 2 (two) times daily.   enoxaparin 80 MG/0.8ML injection Commonly known as:  LOVENOX Inject 0.8 mLs (80 mg total) into the skin every 12 (twelve) hours. Start taking on:  June 05, 2018 What changed:  Another medication with the same name was removed. Continue taking this medication, and follow the directions you see here.   ENSURE Take 237 mLs by mouth 2 (two) times daily between meals.   FLUoxetine 10 MG capsule Commonly known as:  PROZAC Take 1 capsule (10 mg total) by mouth daily.   furosemide 20 MG tablet Commonly known as:  LASIX Take 1 tablet (20 mg total) once a day. Take an extra tablet (20 mg total) for increased swelling, weight gain, or shortness of breath.   hydrOXYzine 25 MG tablet Commonly known as:  ATARAX/VISTARIL Take 1 tablet (25 mg total) by mouth every 8 (eight) hours as needed for itching.   LORazepam 1 MG tablet Commonly known as:  ATIVAN Take 1 tablet (1 mg total) by mouth every 8 (eight) hours as needed for anxiety. What changed:    how much to take  when to take this  additional instructions   magnesium oxide 400 (241.3 Mg) MG tablet Commonly known as:  MAG-OX Take 1 tablet (400 mg total) by  mouth 2 (two) times daily.   metoprolol succinate 25 MG 24 hr tablet Commonly known as:  TOPROL-XL TAKE ONE (1) TABLET BY MOUTH EVERY DAY   mupirocin ointment 2 % Commonly known as:  BACTROBAN Apply 1 application topically 2 (two) times daily.   nitroGLYCERIN 0.4 MG SL tablet Commonly known as:  NITROSTAT Place 1 tablet (0.4 mg total) under the tongue every 5 (five) minutes as needed for chest pain.   pantoprazole 40 MG tablet Commonly known as:  PROTONIX Take one (1) tablet (40 mg) by mouth daily.   predniSONE 5 MG tablet Commonly known as:  DELTASONE Label  & dispense according to the schedule below. take 8 Pills PO for 3 days, 6 Pills PO for 3 days, 4 Pills PO for 3 days, 2 Pills PO for 3 days, 1 Pills PO for 3 days, 1/2 Pill  PO for 3 days then STOP. Total 65 pills.   rosuvastatin 5 MG tablet Commonly known as:  CRESTOR TAKE 5 mg  EVERY DAY   tamsulosin 0.4 MG Caps capsule Commonly known as:  FLOMAX TAKE 1 CAPSULE EVERY DAY   XIIDRA 5 % Soln Generic drug:  Lifitegrast Place 1-2 drops into both eyes daily.       Follow-up Information    [name], MD. Schedule an appointment as soon as possible for a visit in 3 day(s).   Specialty:  Family Medicine Why:  Needs to follow-up with dermatology on a close basis, may require second opinion at a tertiary care facility with another dermatologist. Contact information: [address] [place] [address] [phone]        Jettie Booze, MD .   Specialties:  Cardiology, Radiology, Interventional Cardiology Contact information:  [place] [place] [address] [place] [address] [phone]           Major procedures and Radiology Reports - PLEASE review detailed and final reports thoroughly  -         Ct Chest Wo Contrast  Result Date: 06/01/2018 CLINICAL DATA:  85 y/o M; history of right lower lobe lung cancer post radiation therapy. EXAM: CT CHEST WITHOUT CONTRAST TECHNIQUE:  Multidetector CT imaging of the chest was performed following the standard protocol without IV contrast. COMPARISON:  12/22/2015 PET-CT. 02/02/2018 CT chest. FINDINGS: Cardiovascular: Stable cardiomegaly. No pericardial effusion. Status post CABG with LIMA and saphenous grafts. Severe coronary artery calcific atherosclerosis. Aortic atherosclerosis. Mediastinum/Nodes: No enlarged mediastinal or axillary lymph nodes. Thyroid gland, trachea, and esophagus demonstrate no significant findings. Previously enlarged right upper paratracheal lymph node measures 9 mm short axis on the current study (series 4, image 28). Lungs/Pleura: Band of consolidation at the right posterior lower lobe is decreased in size in comparison with the prior chest CT and compatible with post radiation fibrosis. No discrete mass identified. No new consolidation, effusion, or pneumothorax. Interval resolution of right pleural effusion. Coarse reticular opacities at the periphery of the lungs are compatible with mild fibrosis. Upper Abdomen: No acute abnormality. Musculoskeletal: Healed median sternotomy. No acute osseous abnormality identified. IMPRESSION: 1. Band of consolidation at the right posterior lower lobe is decreased in size in comparison with prior chest CT and compatible with post radiation fibrosis. No discrete mass identified on this noncontrast examination. 2. No mediastinal adenopathy. Previously enlarged right upper paratracheal lymph node is now normal in size by imaging criteria. 3. Interval resolution of right pleural effusion. Electronically Signed   By: Kristine Garbe M.D.   On: 06/01/2018 21:03    Micro Results     Recent Results (from the past 240 hour(s))  MRSA PCR Screening     Status: None   Collection Time: 06/02/18  9:30 AM  Result Value Ref Range Status   MRSA by PCR NEGATIVE NEGATIVE Final    Comment:        The GeneXpert MRSA Assay (FDA approved for NASAL specimens only), is one component of  a comprehensive MRSA colonization surveillance program. It is not intended to diagnose MRSA infection nor to guide or monitor treatment for MRSA infections. Performed at [facility], [place] [address], [place], [place] [address]   Body fluid culture     Status: None (Preliminary result)   Collection Time: 06/03/18  7:49 AM  Result Value Ref Range Status   Specimen Description ARM RIGHT  Final   Special Requests NONE  Final   Gram Stain   Final    MODERATE WBC PRESENT, PREDOMINANTLY PMN NO ORGANISMS SEEN Performed at [facility], [place] [address] [place], [place] [address]    Culture PENDING  Incomplete   Report Status PENDING  Incomplete    Today   Subjective    Eryx Zane today has no headache,no chest abdominal pain,no new weakness tingling or numbness, feels much better wants to go home today.     Objective   Blood pressure (!) 172/91, pulse 73, temperature (!) 97.5 F (36.4 C), temperature source Oral, resp. rate (!) 22, height 5\' 8"  (1.727 m), weight 80.7 kg, SpO2 98 %.   Intake/Output Summary (Last 24 hours) at 06/04/2018 0923 Last data filed at 06/04/2018 0700 Gross per 24 hour  Intake 160.39 ml  Output -  Net 160.39 ml  Exam  Awake Alert, Oriented x 3, No new F.N deficits, Normal affect [place].AT,PERRAL Supple Neck,No JVD, No cervical lymphadenopathy appriciated.  Symmetrical Chest wall movement, Good air movement bilaterally, CTAB RRR,No Gallops,Rubs or new Murmurs, No Parasternal Heave +ve B.Sounds, Abd Soft, Non tender, No organomegaly appriciated, No rebound -guarding or rigidity. No Cyanosis, Clubbing or edema, diffuse macular itchy rash on the trunk and arms, small right arm hematoma under bandage,   Data Review   CBC w Diff:  Lab Results  Component Value Date   WBC 7.1 06/03/2018   HGB 11.2 (L) 06/03/2018   HGB 13.4 08/16/2016   HCT 34.0 (L) 06/03/2018   HCT 39.8 08/16/2016   PLT 63 (L) 06/03/2018   PLT 81 (L) 08/16/2016   LYMPHOPCT  13 06/01/2018   LYMPHOPCT 21.3 08/16/2016   MONOPCT 15 06/01/2018   MONOPCT 20.2 (H) 08/16/2016   EOSPCT 3 06/01/2018   EOSPCT 1.3 08/16/2016   BASOPCT 0 06/01/2018   BASOPCT 0.2 08/16/2016    CMP:  Lab Results  Component Value Date   NA 136 06/03/2018   NA 143 08/16/2016   K 4.7 06/03/2018   K 4.3 08/16/2016   CL 111 06/03/2018   CO2 17 (L) 06/03/2018   CO2 24 08/16/2016   BUN 19 06/03/2018   BUN 14.6 08/16/2016   CREATININE 1.43 (H) 06/03/2018   CREATININE 1.35 (H) 04/20/2018   CREATININE 1.2 08/16/2016   PROT 5.3 (L) 06/03/2018   PROT 6.7 08/16/2016   ALBUMIN 2.7 (L) 06/03/2018   ALBUMIN 3.6 08/16/2016   BILITOT 0.7 06/03/2018   BILITOT 1.26 (H) 08/16/2016   ALKPHOS 66 06/03/2018   ALKPHOS 101 08/16/2016   AST 21 06/03/2018   AST 18 08/16/2016   ALT 17 06/03/2018   ALT 10 08/16/2016  .   Total Time in preparing paper work, data evaluation and todays exam - 11 minutes  Lala Lund M.D on 06/04/2018 at 9:23 AM  [facility]   Office  [phone]

## 2018-06-04 NOTE — Consult Note (Addendum)
WOC consult requested for right arm hematoma. I&D was performed previously, according to the EMR,  and the family states it drained a mod amt blood at that time and receded, but now it has refilled and extended above the skin level.  Intact skin over a 6X5cm dark reddish-purple hematoma which protrudes siginificantly above the surrounding skin.   Topical treatment will not be effective to promote healing. This complex medical condition is beyond the scope of practice for [facility] nurses.  Called primary team to discuss plan of care and he states the surgical team will return to assess the site prior to discharge today. ABD pad and kerlex placed over the site to protect from further injury until further input is available from their team. Please re-consult if further assistance is needed.  Thank-you,  Julien Girt MSN, [place], [place], [place], [place]

## 2018-06-04 NOTE — Progress Notes (Signed)
Evan [name] to be D/C'd per MD order.  Discussed with the patient and Daughter [name] Found all questions fully answered.  VSS, Skin clean, dry and intact without evidence of skin break down, no evidence of skin tears noted. IV catheter discontinued intact. Site without signs and symptoms of complications. Dressing and pressure applied.  An After Visit Summary was printed and given to the patient. Patient received prescription.  D/c education completed with patient/family including follow up instructions, medication list, d/c activities limitations if indicated, with other d/c instructions as indicated by MD - patient able to verbalize understanding, all questions fully answered.   Patient instructed to return to ED, call 911, or call MD for any changes in condition.   Patient escorted via [place], and D/C home via private auto.   Lorenza Evangelist [facility] 06/04/2018 12:52 PM

## 2018-06-04 NOTE — Progress Notes (Signed)
Evan [name] discharged Home with family & H/H RN per MD order.  Discharge instructions reviewed and discussed with the patient and family, all questions and concerns answered. Copy of instructions, care notes and scripts given to patient, as well as faxed to [facility] and pharmacy is going to refill the patient 80 mg dose of lovenox which is what the MD prescribed at discharge.  Allergies as of 06/04/2018      Reactions   Feldene [piroxicam] Other (See Comments)   REACTION: Blisters   Statins Other (See Comments)   Myalgias   Doxycycline Other (See Comments)   REACTION:  unknown   Latex Rash   Tequin Anxiety   Valium Other (See Comments)   REACTION:  unknown      Medication List    STOP taking these medications   sulfamethoxazole-trimethoprim 800-160 MG tablet Commonly known as:  BACTRIM DS,SEPTRA DS     TAKE these medications   acetaminophen 500 MG tablet Commonly known as:  TYLENOL Take 1,000 mg by mouth every 6 (six) hours as needed for mild pain.   camphor-menthol lotion Commonly known as:  SARNA Apply topically as needed for itching.   cephALEXin 500 MG capsule Commonly known as:  KEFLEX Take 1 capsule (500 mg total) by mouth 3 (three) times daily for 5 days.   Cholecalciferol 50 MCG (2000 UT) Tabs Take 1 tablet (2,000 Units total) by mouth daily.   clobetasol cream 0.05 % Commonly known as:  TEMOVATE Apply 1 application topically 2 (two) times daily.   enoxaparin 80 MG/0.8ML injection Commonly known as:  LOVENOX Inject 0.8 mLs (80 mg total) into the skin every 12 (twelve) hours. Start taking on:  June 05, 2018 What changed:  Another medication with the same name was removed. Continue taking this medication, and follow the directions you see here.   ENSURE Take 237 mLs by mouth 2 (two) times daily between meals.   FLUoxetine 10 MG capsule Commonly known as:  PROZAC Take 1 capsule (10 mg total) by mouth daily.   furosemide 20 MG tablet Commonly known  as:  LASIX Take 1 tablet (20 mg total) once a day. Take an extra tablet (20 mg total) for increased swelling, weight gain, or shortness of breath. Notes to patient:  May take an extra tablet for increase swelling, weight gain or shortness of breath   hydrOXYzine 25 MG tablet Commonly known as:  ATARAX/VISTARIL Take 1 tablet (25 mg total) by mouth every 8 (eight) hours as needed for itching.   LORazepam 1 MG tablet Commonly known as:  ATIVAN Take 1 tablet (1 mg total) by mouth every 8 (eight) hours as needed for anxiety. What changed:    how much to take  when to take this  additional instructions   magnesium oxide 400 (241.3 Mg) MG tablet Commonly known as:  MAG-OX Take 1 tablet (400 mg total) by mouth 2 (two) times daily.   metoprolol succinate 25 MG 24 hr tablet Commonly known as:  TOPROL-XL TAKE ONE (1) TABLET BY MOUTH EVERY DAY   mupirocin ointment 2 % Commonly known as:  BACTROBAN Apply 1 application topically 2 (two) times daily.   nitroGLYCERIN 0.4 MG SL tablet Commonly known as:  NITROSTAT Place 1 tablet (0.4 mg total) under the tongue every 5 (five) minutes as needed for chest pain.   pantoprazole 40 MG tablet Commonly known as:  PROTONIX Take one (1) tablet (40 mg) by mouth daily.   predniSONE 5 MG tablet  Commonly known as:  DELTASONE Label  & dispense according to the schedule below. take 8 Pills PO for 3 days, 6 Pills PO for 3 days, 4 Pills PO for 3 days, 2 Pills PO for 3 days, 1 Pills PO for 3 days, 1/2 Pill  PO for 3 days then STOP. Total 65 pills.   rosuvastatin 5 MG tablet Commonly known as:  CRESTOR TAKE 5 mg  EVERY DAY   tamsulosin 0.4 MG Caps capsule Commonly known as:  FLOMAX TAKE 1 CAPSULE EVERY DAY   XIIDRA 5 % Soln Generic drug:  Lifitegrast Place 1-2 drops into both eyes daily.       IV site discontinued and catheter remains intact. Site without signs and symptoms of complications. Dressing and pressure applied.  Patient escorted to  car by NT/volunteer in a wheelchair,  no distress noted upon discharge.  [place], Lynnzie Blackson C 06/04/2018 12:34 PM

## 2018-06-04 NOTE — Discharge Instructions (Signed)
Keep your right arm elevated with 3-4 pillows when you are in bed or sitting in chair, keep your right arm wound clean and dry at all times.    Follow with Primary MD [name], MD in 7 days   Get CBC, CMP  checked  by Primary MD in 5-7 days   Activity: As tolerated with Full fall precautions use walker/cane & assistance as needed  Disposition Home    Diet: Heart Healthy    Special Instructions: If you have smoked or chewed Tobacco  in the last 2 yrs please stop smoking, stop any regular Alcohol  and or any Recreational drug use.  On your next visit with your primary care physician please Get Medicines reviewed and adjusted.  Please request your Prim.MD to go over all Hospital Tests and Procedure/Radiological results at the follow up, please get all Hospital records sent to your Prim MD by signing hospital release before you go home.  If you experience worsening of your admission symptoms, develop shortness of breath, life threatening emergency, suicidal or homicidal thoughts you must seek medical attention immediately by calling 911 or calling your MD immediately  if symptoms less severe.  You Must read complete instructions/literature along with all the possible adverse reactions/side effects for all the Medicines you take and that have been prescribed to you. Take any new Medicines after you have completely understood and accpet all the possible adverse reactions/side effects.

## 2018-06-04 NOTE — Care Management Note (Addendum)
Case Management Note  Patient Details  Name: Evan [name] MRN: [id number] Date of Birth: 1927/10/15  Subjective/Objective:       R arm cellulitis             Action/Plan: Transition to home with home health services,  Expected Discharge Date:  06/04/18               Expected Discharge Plan:  [place]  In-House Referral:  NA  Discharge planning Services  CM Consult  Post Acute Care Choice:  NA Choice offered to:  Patient, Adult Children  DME Arranged:  N/A DME Agency:  NA  HH Arranged:  RN [place] Agency:  [place]  Status of Service:  Completed, signed off  If discussed at [place] of Stay Meetings, dates discussed:    Additional Comments:  Sharin Mons, RN 06/04/2018, 11:57 AM

## 2018-06-05 ENCOUNTER — Ambulatory Visit: Payer: Self-pay | Admitting: Urology

## 2018-06-05 DIAGNOSIS — J449 Chronic obstructive pulmonary disease, unspecified: Secondary | ICD-10-CM | POA: Diagnosis not present

## 2018-06-05 NOTE — Progress Notes (Signed)
Cardiology Office Note   Date:  06/06/2018   ID:  Evan [name], DOB [birth year], MRN [id number]  PCP:  [name], MD    No chief complaint on file.    Wt Readings from Last 3 Encounters:  06/06/18 178 lb 12.8 oz (81.1 kg)  06/01/18 178 lb (80.7 kg)  05/14/18 181 lb (82.1 kg)       History of Present Illness: Evan [name] is a 85 y.o. male  with a history of CAD s/p CABG x2 in 1998 with patent stents and grafts on cardiac catheterization in 06/2017, atrial fibrillation/flutter on Eliquis, chronic diastolic heart failure, Adenocarcinoma of the right lower lungand lung cancer presents for hospital follow up.   Admitted 02/2018 for chest pain. Found to have acute DVT and PE while missed doses of anticoagulation on Eliquis prior to admission. Echo yesterday shows LVEF 60-65% without regional wall motion abnormalities, there is moderate RAE, but normal RV size and function, not suggestive of RH strain - the PE was noted to be peripheral.  Patient follows with radiation oncology for his lung cancer.  Concern for disease progression. Plan is to continue Lovenox indefinitely and follow up with [facility].  Patient and 2 daughter's said "only missed Eliquis for one dose".   Echo in January 2020: IMPRESSIONS    1. The left ventricle appears to be normal in size, have moderate wall thickness, with 60-65%. Echo evidence of normal in diastolic filling patterns.  2. The right ventricle is mildly enlarged in size, has normal wall thickness and mildly reduced systolic function.  3. RVSP:37.6 mmHg.  4. Severely dilated left atrial size.  5. Moderately dilated right atrial size.  6. Aortic valve sclerosis without stenosis.  7. Mitral valve regurgitation is trivial by color flow Doppler.  8. Tricuspid regurgitation mild-moderate.   Denies : Chest pain. Dizziness. Leg edema. Nitroglycerin use. Orthopnea. Palpitations. Paroxysmal nocturnal dyspnea. Shortness of breath. Syncope.   No  blood in stool or urine.    Past Medical History:  Diagnosis Date  . Adenocarcinoma of right lung ([facility]) 01/06/2016  . Anginal chest pain at rest [facility])    Chronic non, controlled on Lorazepam  . Anxiety   . Arthritis    "all over"   . BPH (benign prostatic hyperplasia)   . CAD (coronary artery disease)   . Chronic lower back pain   . Complication of anesthesia    "problems making water afterwards"  . Depression   . DJD (degenerative joint disease)   . Esophageal ulcer without bleeding    bid ppi indefinitely  . Family history of adverse reaction to anesthesia    "children w/PONV"  . GERD (gastroesophageal reflux disease)   . Headache    "couple/week maybe" (09/04/2015)  . Hemochromatosis    Possible, elevated iron stores, hook-like osteophytes on hand films, normal LFTs  . Hepatitis    "yellow jaundice as a baby"  . History of ASCVD    MULTIVESSEL  . Hyperlipidemia   . Hypertension   . Myocardial infarction ([place]) 1999  . Osteoarthritis   . Pneumonia    "several times; got a little now" (09/04/2015)  . Rheumatoid arthritis ([place])   . Subdural hematoma ([facility])    small after fall 09/2016-plavix held, neurosurgery consulted.  Asymptomatic.    Marland Kitchen 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[place]) 02/2018   on Lovenox lifelong since he failed PO Eliquis    Past Surgical History:  Procedure Laterality Date  . ARM SKIN LESION BIOPSY /  EXCISION Left 09/02/2015  . CATARACT EXTRACTION W/ INTRAOCULAR LENS  IMPLANT, BILATERAL Bilateral   . CHOLECYSTECTOMY OPEN    . CORNEAL TRANSPLANT Bilateral    "one at [facility]; one at [facility]"  . CORONARY ANGIOPLASTY WITH STENT PLACEMENT    . CORONARY ARTERY BYPASS GRAFT  1999  . DESCENDING AORTIC ANEURYSM REPAIR W/ STENT     DE stent ostium into the right radial free graft at OM-, 02-2007  . ESOPHAGOGASTRODUODENOSCOPY N/A 11/09/2014   Procedure: ESOPHAGOGASTRODUODENOSCOPY (EGD);  Surgeon: [name], MD;  Location: [facility] ENDOSCOPY;  Service: Endoscopy;  Laterality: N/A;  . INGUINAL  HERNIA REPAIR    . JOINT REPLACEMENT    . LEFT HEART CATH AND CORS/GRAFTS ANGIOGRAPHY N/A 06/27/2017   Procedure: LEFT HEART CATH AND CORS/GRAFTS ANGIOGRAPHY;  Surgeon: [name], MD;  Location: [place] CV LAB;  Service: Cardiovascular;  Laterality: N/A;  . NASAL SINUS SURGERY    . SHOULDER OPEN ROTATOR CUFF REPAIR Right   . TOTAL KNEE ARTHROPLASTY Bilateral      Current Outpatient Medications  Medication Sig Dispense Refill  . acetaminophen (TYLENOL) 500 MG tablet Take 1,000 mg by mouth every 6 (six) hours as needed for mild pain.    . camphor-menthol (SARNA) lotion Apply topically as needed for itching. 222 mL 0  . cephALEXin (KEFLEX) 500 MG capsule Take 1 capsule (500 mg total) by mouth 3 (three) times daily for 5 days. 15 capsule 0  . Cholecalciferol 2000 units TABS Take 1 tablet (2,000 Units total) by mouth daily. 30 each 11  . enoxaparin (LOVENOX) 80 MG/0.8ML injection Inject 0.8 mLs (80 mg total) into the skin every 12 (twelve) hours.    . ENSURE (ENSURE) Take 237 mLs by mouth 2 (two) times daily between meals.    Marland Kitchen FLUoxetine (PROZAC) 10 MG capsule Take 1 capsule (10 mg total) by mouth daily. 30 capsule 3  . furosemide (LASIX) 20 MG tablet Take 1 tablet (20 mg total) once a day. Take an extra tablet (20 mg total) for increased swelling, weight gain, or shortness of breath. 180 tablet 3  . hydrOXYzine (ATARAX/VISTARIL) 25 MG tablet Take 1 tablet (25 mg total) by mouth every 8 (eight) hours as needed for itching. 20 tablet 0  . Lifitegrast (XIIDRA) 5 % SOLN Place 1-2 drops into both eyes daily.     Marland Kitchen LORazepam (ATIVAN) 1 MG tablet Take 1 tablet (1 mg total) by mouth every 8 (eight) hours as needed for anxiety. (Patient taking differently: Take 0.5-1 mg by mouth See admin instructions. Taking 1mg  in the AM, 0.5mg  (1/2 tablet) at lunch and 1/2 tablet (0.5mg ) at bedtime.) 90 tablet 0  . magnesium oxide (MAG-OX) 400 (241.3 Mg) MG tablet Take 1 tablet (400 mg total) by mouth 2 (two)  times daily. 180 tablet 3  . metoprolol succinate (TOPROL-XL) 25 MG 24 hr tablet TAKE ONE (1) TABLET BY MOUTH EVERY DAY 90 tablet 3  . mupirocin ointment (BACTROBAN) 2 % Apply 1 application topically 2 (two) times daily.    . nitroGLYCERIN (NITROSTAT) 0.4 MG SL tablet Place 1 tablet (0.4 mg total) under the tongue every 5 (five) minutes as needed for chest pain. 25 tablet 2  . pantoprazole (PROTONIX) 40 MG tablet Take one (1) tablet (40 mg) by mouth daily. 90 tablet 3  . predniSONE (DELTASONE) 5 MG tablet Label  & dispense according to the schedule below. take 8 Pills PO for 3 days, 6 Pills PO for 3 days, 4 Pills PO  for 3 days, 2 Pills PO for 3 days, 1 Pills PO for 3 days, 1/2 Pill  PO for 3 days then STOP. Total 65 pills. 65 tablet 0  . rosuvastatin (CRESTOR) 5 MG tablet TAKE 5 mg  EVERY DAY 90 tablet 3  . tamsulosin (FLOMAX) 0.4 MG CAPS capsule TAKE 1 CAPSULE EVERY DAY 90 capsule 3   No current facility-administered medications for this visit.     Allergies:   Feldene [piroxicam]; Statins; Doxycycline; Latex; Tequin; and Valium    Social History:  The patient  reports that he has never smoked. He has quit using smokeless tobacco.  His smokeless tobacco use included chew. He reports that he does not drink alcohol or use drugs.   Family History:  The patient's family history includes Arthritis-Osteo in his sister; Heart attack in his brother and another family member.    ROS:  Please see the history of present illness.   Otherwise, review of systems are positive for easy bruising.   All other systems are reviewed and negative.    PHYSICAL EXAM: VS:  BP 122/84   Pulse 67   Ht 5\' 8"  (1.727 m)   Wt 178 lb 12.8 oz (81.1 kg)   SpO2 97%   BMI 27.19 kg/m  , BMI Body mass index is 27.19 kg/m. GEN: Well nourished, well developed, in no acute distress  HEENT: normal  Neck: no JVD, carotid bruits, or masses Cardiac: RRR; no murmurs, rubs, or gallops,no edema  Respiratory:  clear to  auscultation bilaterally, normal work of breathing GI: soft, nontender, nondistended, + BS MS: no deformity or atrophy  Skin: warm and dry, no rash Neuro:  Strength and sensation are intact Psych: euthymic mood, full affect      Recent Labs: 06/21/2017: Pro B Natriuretic peptide (BNP) 666.0 02/02/2018: B Natriuretic Peptide 605.2 06/03/2018: ALT 17; BUN 19; Creatinine, Ser 1.43; Hemoglobin 11.2; Magnesium 2.0; Platelets 63; Potassium 4.7; Sodium 136   Lipid Panel    Component Value Date/Time   CHOL 75 02/03/2018 0524   CHOL 112 01/19/2018 0929   CHOL 97 09/09/2013 0957   TRIG 60 02/03/2018 0524   TRIG 92 09/09/2013 0957   HDL 28 (L) 02/03/2018 0524   HDL 35 (L) 01/19/2018 0929   HDL 37 (L) 09/09/2013 0957   CHOLHDL 2.7 02/03/2018 0524   VLDL 12 02/03/2018 0524   LDLCALC 35 02/03/2018 0524   LDLCALC 59 01/19/2018 0929   LDLCALC 42 09/09/2013 0957     Other studies Reviewed: Additional studies/ records that were reviewed today with results demonstrating: Labs reviewed.  Hemoglobin 11.2 in March 2020.   ASSESSMENT AND PLAN:  1. CAD: No angina.  Continue aggressive secondary prevention.  Cath 1 year ago showed patent grafts. 2. PAF: Lovenox for stroke prevention.  He will check with his oncologist and pulmonologist regarding going to once a day dosing for Lovenox.  He does have small scabs that have come up on his skin since starting Lovenox. 3. DVT/PE: Lovenox for DVT/PE.  This is contributing to his pulmonary hypertension.  In addition, his advanced age would be a contributor. 4. Lung CA: Increases risk of DVT.  Did receive radiation therapy in the past.  No treatment currently.   Current medicines are reviewed at length with the patient today.  The patient concerns regarding his medicines were addressed.  The following changes have been made:  No change  Labs/ tests ordered today include:  No orders of the defined types were  placed in this encounter.   Recommend 150  minutes/week of aerobic exercise Low fat, low carb, high fiber diet recommended  Disposition:   FU in 1 year   Signed, Larae Grooms, MD  06/06/2018 10:43 AM    [facility] [address], [place], [place]  [address] Phone: [phone]; Fax: [phone]

## 2018-06-06 ENCOUNTER — Encounter: Payer: Self-pay | Admitting: Interventional Cardiology

## 2018-06-06 ENCOUNTER — Ambulatory Visit: Payer: Medicare HMO | Admitting: Interventional Cardiology

## 2018-06-06 ENCOUNTER — Other Ambulatory Visit: Payer: Self-pay

## 2018-06-06 ENCOUNTER — Encounter: Payer: Self-pay | Admitting: Urology

## 2018-06-06 ENCOUNTER — Ambulatory Visit
Admission: RE | Admit: 2018-06-06 | Discharge: 2018-06-06 | Disposition: A | Discharge status: Home / Self Care | Payer: Medicare HMO | Source: Ambulatory Visit | Attending: Urology | Admitting: Urology

## 2018-06-06 VITALS — BP 122/84 | HR 67 | Ht 68.0 in | Wt 178.8 lb

## 2018-06-06 DIAGNOSIS — Z86718 Personal history of other venous thrombosis and embolism: Secondary | ICD-10-CM | POA: Insufficient documentation

## 2018-06-06 DIAGNOSIS — Z79899 Other long term (current) drug therapy: Secondary | ICD-10-CM | POA: Insufficient documentation

## 2018-06-06 DIAGNOSIS — C3431 Malignant neoplasm of lower lobe, right bronchus or lung: Secondary | ICD-10-CM | POA: Diagnosis not present

## 2018-06-06 DIAGNOSIS — L03113 Cellulitis of right upper limb: Secondary | ICD-10-CM | POA: Diagnosis not present

## 2018-06-06 DIAGNOSIS — I2510 Atherosclerotic heart disease of native coronary artery without angina pectoris: Secondary | ICD-10-CM | POA: Insufficient documentation

## 2018-06-06 DIAGNOSIS — I82452 Acute embolism and thrombosis of left peroneal vein: Secondary | ICD-10-CM | POA: Diagnosis not present

## 2018-06-06 DIAGNOSIS — Z923 Personal history of irradiation: Secondary | ICD-10-CM | POA: Insufficient documentation

## 2018-06-06 DIAGNOSIS — I480 Paroxysmal atrial fibrillation: Secondary | ICD-10-CM | POA: Diagnosis not present

## 2018-06-06 DIAGNOSIS — I2699 Other pulmonary embolism without acute cor pulmonale: Secondary | ICD-10-CM | POA: Diagnosis not present

## 2018-06-06 DIAGNOSIS — Z08 Encounter for follow-up examination after completed treatment for malignant neoplasm: Secondary | ICD-10-CM | POA: Diagnosis not present

## 2018-06-06 DIAGNOSIS — Z86711 Personal history of pulmonary embolism: Secondary | ICD-10-CM | POA: Diagnosis not present

## 2018-06-06 DIAGNOSIS — I700 Atherosclerosis of aorta: Secondary | ICD-10-CM | POA: Diagnosis not present

## 2018-06-06 DIAGNOSIS — J439 Emphysema, unspecified: Secondary | ICD-10-CM | POA: Insufficient documentation

## 2018-06-06 DIAGNOSIS — Z7901 Long term (current) use of anticoagulants: Secondary | ICD-10-CM | POA: Diagnosis not present

## 2018-06-06 DIAGNOSIS — C3491 Malignant neoplasm of unspecified part of right bronchus or lung: Secondary | ICD-10-CM

## 2018-06-06 LAB — BODY FLUID CULTURE: Culture: NO GROWTH

## 2018-06-06 NOTE — Patient Instructions (Signed)
Medication Instructions:  Your physician recommends that you continue on your current medications as directed. Please refer to the Current Medication list given to you today.  If you need a refill on your cardiac medications before your next appointment, please call your pharmacy.   Lab work: None Ordered  If you have labs (blood work) drawn today and your tests are completely normal, you will receive your results only by: . MyChart Message (if you have MyChart) OR . A paper copy in the mail If you have any lab test that is abnormal or we need to change your treatment, we will call you to review the results.  Testing/Procedures: None ordered  Follow-Up: At [facility], you and your health needs are our priority.  As part of our continuing mission to provide you with exceptional heart care, we have created designated Provider Care Teams.  These Care Teams include your primary Cardiologist (physician) and Advanced Practice Providers (APPs -  Physician Assistants and Nurse Practitioners) who all work together to provide you with the care you need, when you need it. . You will need a follow up appointment in 1 year.  Please call our office 2 months in advance to schedule this appointment.  You may see Jay Varanasi, MD or one of the following Advanced Practice Providers on your designated Care Team:   . Brittainy Simmons, PA-C . Dayna Dunn, PA-C . Michele Lenze, PA-C  Any Other Special Instructions Will Be Listed Below (If Applicable).    

## 2018-06-06 NOTE — Progress Notes (Signed)
Radiation Oncology         [phone] ________________________________  Name: Evan [name] MRN: [id number]  Date: 06/06/2018  DOB: 10-Mar-1928  Post Treatment Note  CC: [name], MD  [name], *  Diagnosis:   85 yo man with clinical stage I Adenocarcinoma of the right lower lung     Interval Since Last Radiation: 2 years, 4 months 02/15/2016 to 02/22/2016:  The RLL target was treated to 50 Gy in 5 fractions of 10 Gy  Narrative:  The patient returns today for routine follow-up and to discuss results from his recent follow up Chest CT.    In summary, he was initially seen at the request of Dr. Julien Nordmann on 01/25/16 for a new diagnosis of early stage lung cancer. The patient had been seen by Dr. Vaughan Browner in pulmonology on 12/03/15 complaining of chest pain and worsening shortness of breath. A CT scan of the chest on 12/15/2015 revealed interval enlargement of 2.7 x 1.6 x 2.0 cm right lower lobe mass highly suspicious for primary bronchogenic carcinoma. There was also borderline enlargement 0.9 cm high right paratracheal lymph node that was nonspecific. A PET scan was performed on 12/22/2015 and it showed predominantly solid 2.9 x 2.6 cm posterior right lower lobe pulmonary nodule with peripheral groundglass density mildly hypermetabolic with maximum SUV of 3.9 which increased from 1.3 x 1.1 cm. There was hypermetabolic bilateral hilar and mediastinal adenopathy highly nonspecific given the suspected underlying interstitial lung disease and could be reactive or metastatic. There was also nonspecific hypermetabolic right level II lymph nodes favoring a reactive node. On 12/24/2015 the patient underwent CT-guided core biopsy of the right lower lobe lung nodule by interventional radiology. The final pathology revealed adenocarcinoma.  MRI of the brain performed on 01/15/2016 showed no evidence for metastatic disease to the brain.  He elected to proceed with SBRT to the RLL mass for  definitive treatment and received 5 fractions to a total dose of 50Gy between 02/15/2016 to 02/22/2016.  He tolerated treatment well and had no significant adverse effects. His initial post-treatment CT Chest demonstrated evolving postradiation changes as well as an interval increase in size of the treated RLL nodule and hilar nodes but were felt likely related to inflammatory changes associated with recent SBRT.  His subsequent follow up Chest CTs for disease surveillance have not shown evidence for disease progression.  He continues in follow up with Dr. Lake Bells in pulmonology for underlying COPD/emphysema.  His follow up post treatment CT scans have continued to demonstrate overall disease stability with treatment related changes/fibrosis but no evidence of disease recurrence or progression.  Unfortunately, in 02/2018, he suffered an acute PE in the lower lobe branch of right pulmonary artery and a DVT in left peroneal vein despite being on chronic Eliquis.  He was treated with 24hrs of IV heparin and discharged home on SQ Lovenox injections which he has continued.  On the CT Angio Chest, there was also noted an interval increase in size of the right paratracheal lymph node, measuring 73mm as compared to 67mm on previous CT Chest 01/04/18.  Other mediastinal nodes remained unchanged.   Interval History: He presents today with family to review results from recent chest CT performed 06/01/18, during a recent hospitalization for pruritic cellulitis in the right forearm.  A Chest CT was performed to r/o possibility of pruritis secondary to a paraneoplastic syndrome given his history of NSCLC.  Fortunately, this scan showed disease stability with no new concerning  nodules or evidence of disease progression.  There has been interval resolution of a right pleural effusion and the previously noted enlarged right upper paratracheal lymph node is now back to normal size.                      On review of systems, the  patient states that he is doing well overall. He continues with chronic shortness of breath which is exacerbated with exertion and lying flat as well as a chronic productive cough with whitish sputum.  He reports that his ShOB has actually improved since starting Lovenox for treatment of the recent PE in 02/2018.  He denies hemoptysis, chest pain, fever, chills or night sweats. He reports a good appetite and has been maintaining his weight.  He denies headaches, changes in visual or auditory acuity, difficulty with speech, imbalance, tremor or seizure activity. His only concern at this point is with the persistent erythema and edema in the right forearm with associated pruritis.  ALLERGIES:  is allergic to feldene [piroxicam]; statins; doxycycline; latex; tequin; and valium.  Meds: Current Outpatient Medications  Medication Sig Dispense Refill  . acetaminophen (TYLENOL) 500 MG tablet Take 1,000 mg by mouth every 6 (six) hours as needed for mild pain.    . camphor-menthol (SARNA) lotion Apply topically as needed for itching. 222 mL 0  . cephALEXin (KEFLEX) 500 MG capsule Take 1 capsule (500 mg total) by mouth 3 (three) times daily for 5 days. 15 capsule 0  . Cholecalciferol 2000 units TABS Take 1 tablet (2,000 Units total) by mouth daily. 30 each 11  . enoxaparin (LOVENOX) 80 MG/0.8ML injection Inject 0.8 mLs (80 mg total) into the skin every 12 (twelve) hours.    . ENSURE (ENSURE) Take 237 mLs by mouth 2 (two) times daily between meals.    Marland Kitchen FLUoxetine (PROZAC) 10 MG capsule Take 1 capsule (10 mg total) by mouth daily. 30 capsule 3  . furosemide (LASIX) 20 MG tablet Take 1 tablet (20 mg total) once a day. Take an extra tablet (20 mg total) for increased swelling, weight gain, or shortness of breath. 180 tablet 3  . hydrOXYzine (ATARAX/VISTARIL) 25 MG tablet Take 1 tablet (25 mg total) by mouth every 8 (eight) hours as needed for itching. 20 tablet 0  . Lifitegrast (XIIDRA) 5 % SOLN Place 1-2 drops  into both eyes daily.     Marland Kitchen LORazepam (ATIVAN) 1 MG tablet Take 1 tablet (1 mg total) by mouth every 8 (eight) hours as needed for anxiety. (Patient taking differently: Take 0.5-1 mg by mouth See admin instructions. Taking 1mg  in the AM, 0.5mg  (1/2 tablet) at lunch and 1/2 tablet (0.5mg ) at bedtime.) 90 tablet 0  . magnesium oxide (MAG-OX) 400 (241.3 Mg) MG tablet Take 1 tablet (400 mg total) by mouth 2 (two) times daily. 180 tablet 3  . metoprolol succinate (TOPROL-XL) 25 MG 24 hr tablet TAKE ONE (1) TABLET BY MOUTH EVERY DAY 90 tablet 3  . mupirocin ointment (BACTROBAN) 2 % Apply 1 application topically 2 (two) times daily.    . nitroGLYCERIN (NITROSTAT) 0.4 MG SL tablet Place 1 tablet (0.4 mg total) under the tongue every 5 (five) minutes as needed for chest pain. 25 tablet 2  . pantoprazole (PROTONIX) 40 MG tablet Take one (1) tablet (40 mg) by mouth daily. 90 tablet 3  . predniSONE (DELTASONE) 5 MG tablet Label  & dispense according to the schedule below. take 8 Pills PO for 3  days, 6 Pills PO for 3 days, 4 Pills PO for 3 days, 2 Pills PO for 3 days, 1 Pills PO for 3 days, 1/2 Pill  PO for 3 days then STOP. Total 65 pills. 65 tablet 0  . rosuvastatin (CRESTOR) 5 MG tablet TAKE 5 mg  EVERY DAY 90 tablet 3  . tamsulosin (FLOMAX) 0.4 MG CAPS capsule TAKE 1 CAPSULE EVERY DAY 90 capsule 3   No current facility-administered medications for this encounter.     Physical Findings:  vitals were not taken for this visit.  Pain Assessment Pain Score: 0-No pain/10 In general this is a well appearing Caucasian male in no acute distress. He's alert and oriented x4 and appropriate throughout the examination. Cardiopulmonary assessment is negative for acute distress and he exhibits normal effort. His right forearm is wrapped with a compression bandage but there is obvious re-accumulation of the previously drained hematoma despite. The dressing is C/D/I.  Lab Findings: Lab Results  Component Value Date    WBC 7.1 06/03/2018   HGB 11.2 (L) 06/03/2018   HCT 34.0 (L) 06/03/2018   MCV 93.9 06/03/2018   PLT 63 (L) 06/03/2018     Radiographic Findings: Ct Chest Wo Contrast  Result Date: 06/01/2018 CLINICAL DATA:  85 y/o M; history of right lower lobe lung cancer post radiation therapy. EXAM: CT CHEST WITHOUT CONTRAST TECHNIQUE: Multidetector CT imaging of the chest was performed following the standard protocol without IV contrast. COMPARISON:  12/22/2015 PET-CT. 02/02/2018 CT chest. FINDINGS: Cardiovascular: Stable cardiomegaly. No pericardial effusion. Status post CABG with LIMA and saphenous grafts. Severe coronary artery calcific atherosclerosis. Aortic atherosclerosis. Mediastinum/Nodes: No enlarged mediastinal or axillary lymph nodes. Thyroid gland, trachea, and esophagus demonstrate no significant findings. Previously enlarged right upper paratracheal lymph node measures 9 mm short axis on the current study (series 4, image 28). Lungs/Pleura: Band of consolidation at the right posterior lower lobe is decreased in size in comparison with the prior chest CT and compatible with post radiation fibrosis. No discrete mass identified. No new consolidation, effusion, or pneumothorax. Interval resolution of right pleural effusion. Coarse reticular opacities at the periphery of the lungs are compatible with mild fibrosis. Upper Abdomen: No acute abnormality. Musculoskeletal: Healed median sternotomy. No acute osseous abnormality identified. IMPRESSION: 1. Band of consolidation at the right posterior lower lobe is decreased in size in comparison with prior chest CT and compatible with post radiation fibrosis. No discrete mass identified on this noncontrast examination. 2. No mediastinal adenopathy. Previously enlarged right upper paratracheal lymph node is now normal in size by imaging criteria. 3. Interval resolution of right pleural effusion. Electronically Signed   By: Kristine Garbe M.D.   On:  06/01/2018 21:03    Impression/Plan: 57. 85 yo man with clinical stage I Adenocarcinoma of the right lower lung. We reviewed his most recent chest CT results which revealed overall disease stability with no new concerning nodules or evidence of disease progression.  There has been interval resolution of a right pleural effusion and the previously noted enlarged right upper paratracheal lymph node is now back to normal size. We will continue to monitor with a repeat chest CT in approximately 6 months. He will follow up in the office thereafter to review results and any further recommendations. He will continue to follow-up with Dr. Lake Bells in pulmonology for his underlying interstitial lung disease contributing to his chronic shortness of breath and cough. He was most recently seen by Dr. Lake Bells 05/08/18 and anticipates a 6 month follow  up around 11/2018.  We will see him back in the office after his next imaging to review those results and recommendations. He knows to call with any questions or concerns in the interim.     2. Acute DVT and PE.  Currently treated with Lovenox SQ injections BID and managed by his PCP, [name]. Anticipate continuing Lovenox anticoagulation indefinitely. He has a scheduled follow up with [name] on 06/08/18.  3. Right forearm cellulitis.  He has a follow up appointment with his PCP, [name] on Friday 06/08/18 and anticipates a referral to dermatology at [facility] for further evaluation/management.    Nicholos Johns, PA-C

## 2018-06-06 NOTE — Progress Notes (Signed)
Vitals stable. Denies pain. Discharged from hospital Monday reference hematoma right arm. Wife reports the hematoma on the patient's right arm "is from scratching." Patient reports occasional SOB. Denies cough. Reports he strangles easily.

## 2018-06-07 DIAGNOSIS — J449 Chronic obstructive pulmonary disease, unspecified: Secondary | ICD-10-CM | POA: Diagnosis not present

## 2018-06-08 ENCOUNTER — Encounter: Payer: Self-pay | Admitting: Family Medicine

## 2018-06-08 ENCOUNTER — Ambulatory Visit (INDEPENDENT_AMBULATORY_CARE_PROVIDER_SITE_OTHER): Payer: Medicare HMO | Admitting: Family Medicine

## 2018-06-08 VITALS — BP 110/60 | HR 80 | Temp 97.6°F | Resp 16 | Ht 68.0 in | Wt 180.0 lb

## 2018-06-08 DIAGNOSIS — I2699 Other pulmonary embolism without acute cor pulmonale: Secondary | ICD-10-CM | POA: Diagnosis not present

## 2018-06-08 DIAGNOSIS — L03113 Cellulitis of right upper limb: Secondary | ICD-10-CM | POA: Diagnosis not present

## 2018-06-08 DIAGNOSIS — S40021D Contusion of right upper arm, subsequent encounter: Secondary | ICD-10-CM | POA: Diagnosis not present

## 2018-06-08 DIAGNOSIS — L299 Pruritus, unspecified: Secondary | ICD-10-CM | POA: Diagnosis not present

## 2018-06-08 DIAGNOSIS — S5011XD Contusion of right forearm, subsequent encounter: Secondary | ICD-10-CM

## 2018-06-08 DIAGNOSIS — R21 Rash and other nonspecific skin eruption: Secondary | ICD-10-CM | POA: Diagnosis not present

## 2018-06-08 DIAGNOSIS — I110 Hypertensive heart disease with heart failure: Secondary | ICD-10-CM | POA: Diagnosis not present

## 2018-06-08 DIAGNOSIS — C3490 Malignant neoplasm of unspecified part of unspecified bronchus or lung: Secondary | ICD-10-CM | POA: Diagnosis not present

## 2018-06-08 DIAGNOSIS — I482 Chronic atrial fibrillation, unspecified: Secondary | ICD-10-CM | POA: Diagnosis not present

## 2018-06-08 MED ORDER — CEPHALEXIN 500 MG PO CAPS: 500.0000 mg | ORAL_CAPSULE | Freq: Four times a day (QID) | ORAL | 0 refills | Status: DC

## 2018-06-08 NOTE — Progress Notes (Signed)
Subjective:    Patient ID: Evan [name], male    DOB: [birth year], 85 y.o.   MRN: [id number]  Illness   01/30/18 Patient walked in urgently this afternoon at 4:00 complaining of feeling weak.  Initially a temporal thermometer was used and his temperature was found to be 102.9!.  We repeated this with an oral thermometer and his temperature was found to be 98.8.  Therefore I believe the temperature was incorrect.  I repeated the oral temperature again 20 minutes later and it was 97.8.  Patient's blood pressure initially upon walking and was 98/60.  Therefore we started the patient on normal saline and he was given a 500 cc bolus.  Patient states that he was fine when he went to sleep.  When he awoke from his nap this afternoon, he felt extremely shaky and weak.  His legs felt weak.  His daughter brought him straight to the office.  He states that he has had a cough over the last week that is gradually gotten worse.  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  However he does report increasing cough and increasing weakness over the last week.  He denies any sore throat.  He denies any rhinorrhea.  He denies any otalgia.  He denies any nausea or vomiting.  He denies any chest pain.  He denies any shortness of breath above his baseline.  He denies any dysuria.  He denies any urgency or frequency.  Initial flu test was negative.  At that time, my plan was:  Exam today is unremarkable.  The initial temperature was incorrect.  Repeated oral temperatures have been in a normal range between 97.8 and 98.8.  Therefore I will disregard the initial temporal scan which said 102.9.  Patient's blood pressure has improved to 120/60 after receiving a 500 cc bolus of normal saline.  Flu test is negative.  Given the increasing cough and the crackles on exam I  will treat the patient for possible community-acquired pneumonia.  Patient was given 1 g of Rocephin IM x1 now and I will start him on a Z-Pak.  Patient will return tomorrow for reevaluation to ensure that he is in gradually improving.  CBC and CMP were sent.  Patient is instructed to go to the emergency room if symptoms worsen or change in any way.  02/05/18 Patient return the next day for follow-up.  Given worsening sided chest pain shortness of breath, he was recommended to go to the emergency room.  In the emergency room, CT scan of the chest revealed right for pulmonary embolism.  He was also found to have a left peroneal vein DVT.  He was admitted to the hospital and started on heparin and then transitioned to Lovenox 80 mg subcu twice daily for treatment of the pulmonary embolism.  It was recommended for lifelong anticoagulation given the fact the patient has a history of right-sided lung cancer and also formed DVT and pulmonary embolism while taking Eliquis.  He is here today for follow-up.  His daughter states that he may have missed 1 or 2 doses of Eliquis but he usually took it twice a day.  He was compliant more than 90% of the time.  He is reporting right-sided chest pain.  He is also reporting fatigue and shortness of breath and dyspnea on exertion.  Symptoms have not changed considerably from last week.  He denies any syncope.  He denies any palpitations.  At that time, my plan was: Given his history of lung cancer, and the fact that his DVT and pulmonary embolism formed while taking Eliquis, I recommended that the patient remain on Lovenox indefinitely.  He will discontinue Eliquis and use only Lovenox.  I would recommend 80 mg twice daily for at least the first 6 months.  Then to ensure compliance and for ease of administration, we may consider switching to once daily lovenox but use 1.5 mg/kg daily (120 mg).  I will follow-up lab studies as requested by the hospitalist including a CBC and  CMP.  02/09/18 Patient continues to endorse the same symptoms.  He reports fatigue.  He reports weakness.  He reports that his legs feel shaky.  He reports chest pain that is essentially been present over the last year and is essentially unchanged.  He reports vague shortness of breath.  Physical therapy has not yet coming to his home.  AT that time, my plan was: The majority of the patient's symptoms today are chronic issues he is complained of over the last few years including shortness of breath weakness, feeling shaky.  I believe that these chronic symptoms have recently been exacerbated by his DVT and pulmonary embolism.  I would recheck the patient in 3 months.  Continue Lovenox twice daily for 3 months and then switch to 1.5 mg/kg daily in 3 months.  Monitor MP today to check his renal function along with his magnesium and potassium.  Arrange for home health physical therapy  05/14/18 Patient is here today for follow-up.  He is completed 3 months of Lovenox therapy.  Therefore I would like to switch now to once daily Lovenox 1.5 mg/kg/day which would equal out 220 mg shots.  This would be much more convenient for the patient and the family as they are having to drive to his house twice a day to give him the shots.  Unfortunately he continues to suffer from diffuse itching.  Please see my last few office notes.  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  He is constantly scratching his skin.  He had a previous flare of this several years ago and was ultimately referred to a dermatologist.  Neither the patient nor the family can remember what finally cause the situation to improve.  Recently it started happening again.  He developed a eczema-like rash on his right upper extremity and the flexor crease which did not respond to topical steroids or oral steroids.  He was referred to dermatology who treated him for a secondary staph infection with Keflex.  The staph infection has improved but the  patient continues to scratch all over his body.  There are numerous excoriations and scabs on his back on his shoulders on his arms and on his legs were he is scratching.  Even today during our encounter, almost reflexively, the patient is scratching at his abdomen and his wrist.  Ironically this all started around the same time we wean down on his Prozac.  I believe anxiety could be contributing to a lot of this and exacerbating the neuropathic itch.  I believe some of this is reflux driven as well.  However he is seen no benefit from gabapentin.  He tried hydroxyzine in the past and not had any benefit with the itching.  I put him on prednisone.  Dermatology put him on 2 rounds of  prednisone with no improvement.  I question if this may be psychosomatic coupled with some underlying dry skin or possibly even neuropathic itching.  At that time, my plan was: Difficult case.  I am not sure the cause of the patient's pruritus.  He has seen dermatology now numerous times and has had no benefit in the itching.  I believe some of this may be anxiety driven and psychosomatic coupled with neuropathic itching.  I do believe it is not just a coincidence that this all started to happen once we wean down on his Prozac.  Therefore I have recommended trying to increase the Prozac back to 20 mg a day and using topical capsaicin cream for the itching in case this is neuropathic in nature especially given the fact that none of the topical steroid creams or even oral prednisone has helped.  Could consider increasing gabapentin to a higher dose but I am worried about polypharmacy and falls.  Hydroxyzine was not beneficial.  Could possibly consider amitriptyline.  Reassess in 2 weeks.  Patient will complete all of the prefilled Lovenox ranges he has now which is approximately 3 weeks and then we will switch the patient to 120 mg once a day of Lovenox indefinitely.  06/08/18 Patient was recently admitted to the hospital with cellulitis  in his right forearm.  He also developed a traumatic hematoma to the right forearm after scratching vigorously.  This was treated with aspiration x2 in the hospital and pressure wraps.  He is here today for follow-up.  The cellulitis has improved in the right forearm.  The erythema has faded.  He continues to report general pruritus all over his body per tickly on his right arm.  However the hematoma has now enlarged dramatically.  It is 6 cm x 4 cm.  The skin overlying the hematoma is hard and now black showing a necrotic eschar consistent with dry gangrene.  I leave it is only a matter of time before this hard necrotic shell will rupture causing uncontrolled bleeding.  There is fluctuant blood underneath the skin that is still accumulating.  The patient is still taking Lovenox 80 mg twice daily. Past Medical History:  Diagnosis Date  . Adenocarcinoma of right lung ([place]) 01/06/2016  . Anginal chest pain at rest [facility])    Chronic non, controlled on Lorazepam  . Anxiety   . Arthritis    "all over"   . BPH (benign prostatic hyperplasia)   . CAD (coronary artery disease)   . Chronic lower back pain   . Complication of anesthesia    "problems making water afterwards"  . Depression   . DJD (degenerative joint disease)   . Esophageal ulcer without bleeding    bid ppi indefinitely  . Family history of adverse reaction to anesthesia    "children w/PONV"  . GERD (gastroesophageal reflux disease)   . Headache    "couple/week maybe" (09/04/2015)  . Hemochromatosis    Possible, elevated iron stores, hook-like osteophytes on hand films, normal LFTs  . Hepatitis    "yellow jaundice as a baby"  . History of ASCVD    MULTIVESSEL  . Hyperlipidemia   . Hypertension   . Myocardial infarction ([place]) 1999  . Osteoarthritis   . Pneumonia    "several times; got a little now" (09/04/2015)  . Rheumatoid arthritis ([place])   . Subdural hematoma (H[facility])    small after fall 09/2016-plavix held, neurosurgery consulted.   Asymptomatic.    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 ARM SKIN LESION BIOPSY / EXCISION Left 09/02/2015  . CATARACT EXTRACTION W/ INTRAOCULAR LENS  IMPLANT, BILATERAL Bilateral   . CHOLECYSTECTOMY OPEN    . CORNEAL TRANSPLANT Bilateral    "one at [facility]; one at [facility]"  . CORONARY ANGIOPLASTY WITH STENT PLACEMENT    . CORONARY ARTERY BYPASS GRAFT  1999  . DESCENDING AORTIC ANEURYSM REPAIR W/ STENT     DE stent ostium into the right radial free graft at OM-, 02-2007  . ESOPHAGOGASTRODUODENOSCOPY N/A 11/09/2014   Procedure: ESOPHAGOGASTRODUODENOSCOPY (EGD);  Surgeon: [name], MD;  Location: [facility] ENDOSCOPY;  Service: Endoscopy;  Laterality: N/A;  . INGUINAL HERNIA REPAIR    . JOINT REPLACEMENT    . LEFT HEART CATH AND CORS/GRAFTS ANGIOGRAPHY N/A 06/27/2017   Procedure: LEFT HEART CATH AND CORS/GRAFTS ANGIOGRAPHY;  Surgeon: [name], MD;  Location: [place] CV LAB;  Service: Cardiovascular;  Laterality: N/A;  . NASAL SINUS SURGERY    . SHOULDER OPEN ROTATOR CUFF REPAIR Right   . TOTAL KNEE ARTHROPLASTY Bilateral    Current Outpatient Medications on File Prior to Visit  Medication Sig Dispense Refill  . acetaminophen (TYLENOL) 500 MG tablet Take 1,000 mg by mouth every 6 (six) hours as needed for mild pain.    . camphor-menthol (SARNA) lotion Apply topically as needed for itching. 222 mL 0  . Cholecalciferol 2000 units TABS Take 1 tablet (2,000 Units total) by mouth daily. 30 each 11  . enoxaparin (LOVENOX) 80 MG/0.8ML injection Inject 0.8 mLs (80 mg total) into the skin every 12 (twelve) hours.    . ENSURE (ENSURE) Take 237 mLs by mouth 2 (two) times daily between meals.    Marland Kitchen FLUoxetine (PROZAC) 20 MG tablet Take 20 mg by mouth daily.    . furosemide (LASIX) 20 MG tablet Take 1 tablet (20 mg total) once a day. Take an extra tablet (20 mg total) for increased swelling, weight gain, or  shortness of breath. 180 tablet 3  . hydrOXYzine (ATARAX/VISTARIL) 25 MG tablet Take 1 tablet (25 mg total) by mouth every 8 (eight) hours as needed for itching. 20 tablet 0  . Lifitegrast (XIIDRA) 5 % SOLN Place 1-2 drops into both eyes daily.     Marland Kitchen LORazepam (ATIVAN) 1 MG tablet Take 1 tablet (1 mg total) by mouth every 8 (eight) hours as needed for anxiety. (Patient taking differently: Take 0.5-1 mg by mouth See admin instructions. Taking 1mg  in the AM, 0.5mg  (1/2 tablet) at lunch and 1/2 tablet (0.5mg ) at bedtime.) 90 tablet 0  . magnesium oxide (MAG-OX) 400 (241.3 Mg) MG tablet Take 1 tablet (400 mg total) by mouth 2 (two) times daily. 180 tablet 3  . metoprolol succinate (TOPROL-XL) 25 MG 24 hr tablet TAKE ONE (1) TABLET BY MOUTH EVERY DAY 90 tablet 3  . mupirocin ointment (BACTROBAN) 2 % Apply 1 application topically 2 (two) times daily.    . nitroGLYCERIN (NITROSTAT) 0.4 MG SL tablet Place 1 tablet (0.4 mg total) under the tongue every 5 (five) minutes as needed for chest pain. 25 tablet 2  . pantoprazole (PROTONIX) 40 MG tablet Take one (1) tablet (40 mg) by mouth daily. 90 tablet 3  . predniSONE (DELTASONE) 5 MG tablet Label  & dispense according to the schedule below. take 8 Pills PO for 3 days, 6 Pills PO for 3 days, 4 Pills PO for 3 days,  2 Pills PO for 3 days, 1 Pills PO for 3 days, 1/2 Pill  PO for 3 days then STOP. Total 65 pills. 65 tablet 0  . rosuvastatin (CRESTOR) 5 MG tablet TAKE 5 mg  EVERY DAY 90 tablet 3  . tamsulosin (FLOMAX) 0.4 MG CAPS capsule TAKE 1 CAPSULE EVERY DAY 90 capsule 3   No current facility-administered medications on file prior to visit.    Allergies  Allergen Reactions  . Feldene [Piroxicam] Other (See Comments)    REACTION: Blisters  . Statins Other (See Comments)    Myalgias  . Doxycycline Other (See Comments)    REACTION:  unknown  . Latex Rash  . Tequin Anxiety  . Valium Other (See Comments)    REACTION:  unknown   Social History    Socioeconomic History  . Marital status: Widowed    Spouse name: Not on file  . Number of children: Not on file  . Years of education: Not on file  . Highest education level: Not on file  Occupational History  . Not on file  Social Needs  . Financial resource strain: Not on file  . Food insecurity:    Worry: Not on file    Inability: Not on file  . Transportation needs:    Medical: Not on file    Non-medical: Not on file  Tobacco Use  . Smoking status: Never Smoker  . Smokeless tobacco: Former Systems developer    Types: Chew  . Tobacco comment: "quit chewing in the 1960s"  Substance and Sexual Activity  . Alcohol use: No  . Drug use: No  . Sexual activity: Never  Lifestyle  . Physical activity:    Days per week: Not on file    Minutes per session: Not on file  . Stress: Not on file  Relationships  . Social connections:    Talks on phone: Not on file    Gets together: Not on file    Attends religious service: Not on file    Active member of club or organization: Not on file    Attends meetings of clubs or organizations: Not on file    Relationship status: Not on file  . Intimate partner violence:    Fear of current or ex partner: Not on file    Emotionally abused: Not on file    Physically abused: Not on file    Forced sexual activity: Not on file  Other Topics Concern  . Not on file  Social History Narrative   01-05-18 Unable to ask abuse questions family with him today.   11-012-19 Unable to ask abuse questions family with him today.      Review of Systems  All other systems reviewed and are negative.      Objective:   Physical Exam Vitals signs reviewed.  Constitutional:      General: He is not in acute distress.    Appearance: He is well-developed. He is not diaphoretic.  HENT:     Right Ear: External ear normal.     Left Ear: External ear normal.     Nose: Nose normal.     Mouth/Throat:     Pharynx: No oropharyngeal exudate.  Eyes:      Conjunctiva/sclera: Conjunctivae normal.  Neck:     Musculoskeletal: Neck supple.     Vascular: No JVD.  Cardiovascular:     Rate and Rhythm: Normal rate and regular rhythm.     Heart sounds: Normal heart sounds. No murmur.  Pulmonary:     Effort: Pulmonary effort is normal. No respiratory distress.     Breath sounds: No wheezing or rales.  Chest:     Chest wall: No tenderness.  Abdominal:     General: Bowel sounds are normal. There is no distension.     Palpations: Abdomen is soft.     Tenderness: There is no abdominal tenderness. There is no guarding or rebound.  Lymphadenopathy:     Cervical: No cervical adenopathy.    6 cm x 5 cm large hematoma on the right forearm with a dry black necrotic eschar over top of this consistent with dry gangrene.  Hematoma is fluctuant with liquid blood.       Assessment & Plan:  Pulmonary embolism and infarction (HCC)  Pruritus  Traumatic hematoma of right forearm, subsequent encounter  As the dry gangrene breaks down, patient will begin to bleed.  Therefore I placed the patient in a compression dressing.  I applied Silvadene cream to the skin.  I covered it with nonadherent gauze.  I then wrapped the patient with calamine impregnated gauze as a pressure dressing and then wrapped it with Coban similar to an Haematologist.  This was applied in a fashion consistent with a short arm cast from the elbow to the hand.  Pictures were taken of the hematoma prior to dressing.  I have recommended the patient discontinue Lovenox as I feel there is still active bleeding.  Recheck the patient on Monday.  Continue Keflex for now to cover cellulitis.  May need another therapeutic aspiration and repeat pressure dressing on Monday if the hematoma is not beginning to reabsorb and shrink and firm up.  Consult dermatology at [facility] given general pruritus.

## 2018-06-11 ENCOUNTER — Encounter: Payer: Self-pay | Admitting: Family Medicine

## 2018-06-11 ENCOUNTER — Ambulatory Visit (INDEPENDENT_AMBULATORY_CARE_PROVIDER_SITE_OTHER): Payer: Medicare HMO | Admitting: Family Medicine

## 2018-06-11 VITALS — BP 120/74 | HR 78 | Temp 98.2°F | Resp 18 | Ht 68.0 in | Wt 180.0 lb

## 2018-06-11 DIAGNOSIS — S5011XD Contusion of right forearm, subsequent encounter: Secondary | ICD-10-CM | POA: Diagnosis not present

## 2018-06-11 DIAGNOSIS — L299 Pruritus, unspecified: Secondary | ICD-10-CM

## 2018-06-11 NOTE — Progress Notes (Signed)
Subjective:    Patient ID: Evan [name], male    DOB: 13-Jan-1928, 85 y.o.   MRN: [id number]  Illness   01/30/18 Patient walked in urgently this afternoon at 4:00 complaining of feeling weak.  Initially a temporal thermometer was used and his temperature was found to be 102.9!.  We repeated this with an oral thermometer and his temperature was found to be 98.8.  Therefore I believe the temperature was incorrect.  I repeated the oral temperature again 20 minutes later and it was 97.8.  Patient's blood pressure initially upon walking and was 98/60.  Therefore we started the patient on normal saline and he was given a 500 cc bolus.  Patient states that he was fine when he went to sleep.  When he awoke from his nap this afternoon, he felt extremely shaky and weak.  His legs felt weak.  His daughter brought him straight to the office.  He states that he has had a cough over the last week that is gradually gotten worse.  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  However he does report increasing cough and increasing weakness over the last week.  He denies any sore throat.  He denies any rhinorrhea.  He denies any otalgia.  He denies any nausea or vomiting.  He denies any chest pain.  He denies any shortness of breath above his baseline.  He denies any dysuria.  He denies any urgency or frequency.  Initial flu test was negative.  At that time, my plan was:  Exam today is unremarkable.  The initial temperature was incorrect.  Repeated oral temperatures have been in a normal range between 97.8 and 98.8.  Therefore I will disregard the initial temporal scan which said 102.9.  Patient's blood pressure has improved to 120/60 after receiving a 500 cc bolus of normal saline.  Flu test is negative.  Given the increasing cough and the crackles on exam I  will treat the patient for possible community-acquired pneumonia.  Patient was given 1 g of Rocephin IM x1 now and I will start him on a Z-Pak.  Patient will return tomorrow for reevaluation to ensure that he is in gradually improving.  CBC and CMP were sent.  Patient is instructed to go to the emergency room if symptoms worsen or change in any way.  02/05/18 Patient return the next day for follow-up.  Given worsening sided chest pain shortness of breath, he was recommended to go to the emergency room.  In the emergency room, CT scan of the chest revealed right for pulmonary embolism.  He was also found to have a left peroneal vein DVT.  He was admitted to the hospital and started on heparin and then transitioned to Lovenox 80 mg subcu twice daily for treatment of the pulmonary embolism.  It was recommended for lifelong anticoagulation given the fact the patient has a history of right-sided lung cancer and also formed DVT and pulmonary embolism while taking Eliquis.  He is here today for follow-up.  His daughter states that he may have missed 1 or 2 doses of Eliquis but he usually took it twice a day.  He was compliant more than 90% of the time.  He is reporting right-sided chest pain.  He is also reporting fatigue and shortness of breath and dyspnea on exertion.  Symptoms have not changed considerably from last week.  He denies any syncope.  He denies any palpitations.  At that time, my plan was: Given his history of lung cancer, and the fact that his DVT and pulmonary embolism formed while taking Eliquis, I recommended that the patient remain on Lovenox indefinitely.  He will discontinue Eliquis and use only Lovenox.  I would recommend 80 mg twice daily for at least the first 6 months.  Then to ensure compliance and for ease of administration, we may consider switching to once daily lovenox but use 1.5 mg/kg daily (120 mg).  I will follow-up lab studies as requested by the hospitalist including a CBC and  CMP.  02/09/18 Patient continues to endorse the same symptoms.  He reports fatigue.  He reports weakness.  He reports that his legs feel shaky.  He reports chest pain that is essentially been present over the last year and is essentially unchanged.  He reports vague shortness of breath.  Physical therapy has not yet coming to his home.  AT that time, my plan was: The majority of the patient's symptoms today are chronic issues he is complained of over the last few years including shortness of breath weakness, feeling shaky.  I believe that these chronic symptoms have recently been exacerbated by his DVT and pulmonary embolism.  I would recheck the patient in 3 months.  Continue Lovenox twice daily for 3 months and then switch to 1.5 mg/kg daily in 3 months.  Monitor MP today to check his renal function along with his magnesium and potassium.  Arrange for home health physical therapy  05/14/18 Patient is here today for follow-up.  He is completed 3 months of Lovenox therapy.  Therefore I would like to switch now to once daily Lovenox 1.5 mg/kg/day which would equal out 220 mg shots.  This would be much more convenient for the patient and the family as they are having to drive to his house twice a day to give him the shots.  Unfortunately he continues to suffer from diffuse itching.  Please see my last few office notes.  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  He is constantly scratching his skin.  He had a previous flare of this several years ago and was ultimately referred to a dermatologist.  Neither the patient nor the family can remember what finally cause the situation to improve.  Recently it started happening again.  He developed a eczema-like rash on his right upper extremity and the flexor crease which did not respond to topical steroids or oral steroids.  He was referred to dermatology who treated him for a secondary staph infection with Keflex.  The staph infection has improved but the  patient continues to scratch all over his body.  There are numerous excoriations and scabs on his back on his shoulders on his arms and on his legs were he is scratching.  Even today during our encounter, almost reflexively, the patient is scratching at his abdomen and his wrist.  Ironically this all started around the same time we wean down on his Prozac.  I believe anxiety could be contributing to a lot of this and exacerbating the neuropathic itch.  I believe some of this is reflux driven as well.  However he is seen no benefit from gabapentin.  He tried hydroxyzine in the past and not had any benefit with the itching.  I put him on prednisone.  Dermatology put him on 2 rounds of  prednisone with no improvement.  I question if this may be psychosomatic coupled with some underlying dry skin or possibly even neuropathic itching.  At that time, my plan was: Difficult case.  I am not sure the cause of the patient's pruritus.  He has seen dermatology now numerous times and has had no benefit in the itching.  I believe some of this may be anxiety driven and psychosomatic coupled with neuropathic itching.  I do believe it is not just a coincidence that this all started to happen once we wean down on his Prozac.  Therefore I have recommended trying to increase the Prozac back to 20 mg a day and using topical capsaicin cream for the itching in case this is neuropathic in nature especially given the fact that none of the topical steroid creams or even oral prednisone has helped.  Could consider increasing gabapentin to a higher dose but I am worried about polypharmacy and falls.  Hydroxyzine was not beneficial.  Could possibly consider amitriptyline.  Reassess in 2 weeks.  Patient will complete all of the prefilled Lovenox ranges he has now which is approximately 3 weeks and then we will switch the patient to 120 mg once a day of Lovenox indefinitely.  06/08/18 Patient was recently admitted to the hospital with cellulitis  in his right forearm.  He also developed a traumatic hematoma to the right forearm after scratching vigorously.  This was treated with aspiration x2 in the hospital and pressure wraps.  He is here today for follow-up.  The cellulitis has improved in the right forearm.  The erythema has faded.  He continues to report general pruritus all over his body per tickly on his right arm.  However the hematoma has now enlarged dramatically.  It is 6 cm x 4 cm.  The skin overlying the hematoma is hard and now black showing a necrotic eschar consistent with dry gangrene.  I leave it is only a matter of time before this hard necrotic shell will rupture causing uncontrolled bleeding.  There is fluctuant blood underneath the skin that is still accumulating.  The patient is still taking Lovenox 80 mg twice daily.  At that time, my plan was: As the dry gangrene breaks down, patient will begin to bleed.  Therefore I placed the patient in a compression dressing.  I applied Silvadene cream to the skin.  I covered it with nonadherent gauze.  I then wrapped the patient with calamine impregnated gauze as a pressure dressing and then wrapped it with Coban similar to an Haematologist.  This was applied in a fashion consistent with a short arm cast from the elbow to the hand.  Pictures were taken of the hematoma prior to dressing.  I have recommended the patient discontinue Lovenox as I feel there is still active bleeding.  Recheck the patient on Monday.  Continue Keflex for now to cover cellulitis.  May need another therapeutic aspiration and repeat pressure dressing on Monday if the hematoma is not beginning to reabsorb and shrink and firm up.  Consult dermatology at [facility] given general pruritus.  06/11/18 There is blood that has drained through the sides of the bandage.  However there is no soaking blood.  Unna boot bandages removed from his forearm.  There continues to be a large fluctuant hematoma.  It is 6 cm x 6 cm.  There  continues to be a necrotic eschar over the top of the hematoma.  However there is no evidence of  cellulitis.  I used an 18-gauge needle and attempted to aspirate the blood from the hematoma so that I could more effectively apply pressure dressing.  I was unable to aspirate any blood despite introducing the needle directly into the hematoma.  I then repositioned 180 degrees around the hematoma and attempted the same aspiration from the other side.  I was unsuccessful in aspirating any blood suggesting this time it has now clotted and coagulated suggesting against any active bleeding.  Therefore I believe the large mass is likely a blood clot.  In fact no blood came through the aspiration site.  I believe stopping the Lovenox has allow coagulation and hemostasis.  Therefore the patient was placed back in bandaging.  Past Medical History:  Diagnosis Date  . Adenocarcinoma of right lung ([place]) 01/06/2016  . Anginal chest pain at [facility])    Chronic non, controlled on Lorazepam  . Anxiety   . Arthritis    "all over"   . BPH (benign prostatic hyperplasia)   . CAD (coronary artery disease)   . Chronic lower back pain   . Complication of anesthesia    "problems making water afterwards"  . Depression   . DJD (degenerative joint disease)   . Esophageal ulcer without bleeding    bid ppi indefinitely  . Family history of adverse reaction to anesthesia    "children w/PONV"  . GERD (gastroesophageal reflux disease)   . Headache    "couple/week maybe" (09/04/2015)  . Hemochromatosis    Possible, elevated iron stores, hook-like osteophytes on hand films, normal LFTs  . Hepatitis    "yellow jaundice as a baby"  . History of ASCVD    MULTIVESSEL  . Hyperlipidemia   . Hypertension   . Myocardial infarction ([place]) 1999  . Osteoarthritis   . Pneumonia    "several times; got a little now" (09/04/2015)  . Rheumatoid arthritis ([place])   . Subdural hematoma ([facility])    small after fall 09/2016-plavix held,  neurosurgery consulted.  Asymptomatic.    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 ARM SKIN LESION BIOPSY / EXCISION Left 09/02/2015  . CATARACT EXTRACTION W/ INTRAOCULAR LENS  IMPLANT, BILATERAL Bilateral   . CHOLECYSTECTOMY OPEN    . CORNEAL TRANSPLANT Bilateral    "one at [facility]; one at [facility]"  . CORONARY ANGIOPLASTY WITH STENT PLACEMENT    . CORONARY ARTERY BYPASS GRAFT  1999  . DESCENDING AORTIC ANEURYSM REPAIR W/ STENT     DE stent ostium into the right radial free graft at OM-, 02-2007  . ESOPHAGOGASTRODUODENOSCOPY N/A 11/09/2014   Procedure: ESOPHAGOGASTRODUODENOSCOPY (EGD);  Surgeon: [name], MD;  Location: [facility] ENDOSCOPY;  Service: Endoscopy;  Laterality: N/A;  . INGUINAL HERNIA REPAIR    . JOINT REPLACEMENT    . LEFT HEART CATH AND CORS/GRAFTS ANGIOGRAPHY N/A 06/27/2017   Procedure: LEFT HEART CATH AND CORS/GRAFTS ANGIOGRAPHY;  Surgeon: [name], MD;  Location: [place] CV LAB;  Service: Cardiovascular;  Laterality: N/A;  . NASAL SINUS SURGERY    . SHOULDER OPEN ROTATOR CUFF REPAIR Right   . TOTAL KNEE ARTHROPLASTY Bilateral    Current Outpatient Medications on File Prior to Visit  Medication Sig Dispense Refill  . acetaminophen (TYLENOL) 500 MG tablet Take 1,000 mg by mouth every 6 (six) hours as needed for mild pain.    . camphor-menthol (SARNA) lotion Apply topically as needed for  itching. 222 mL 0  . cephALEXin (KEFLEX) 500 MG capsule Take 1 capsule (500 mg total) by mouth 4 (four) times daily. 28 capsule 0  . Cholecalciferol 2000 units TABS Take 1 tablet (2,000 Units total) by mouth daily. 30 each 11  . enoxaparin (LOVENOX) 80 MG/0.8ML injection Inject 0.8 mLs (80 mg total) into the skin every 12 (twelve) hours.    . ENSURE (ENSURE) Take 237 mLs by mouth 2 (two) times daily between meals.    Marland Kitchen FLUoxetine (PROZAC) 20 MG tablet Take 20 mg by mouth daily.    . furosemide  (LASIX) 20 MG tablet Take 1 tablet (20 mg total) once a day. Take an extra tablet (20 mg total) for increased swelling, weight gain, or shortness of breath. 180 tablet 3  . hydrOXYzine (ATARAX/VISTARIL) 25 MG tablet Take 1 tablet (25 mg total) by mouth every 8 (eight) hours as needed for itching. 20 tablet 0  . Lifitegrast (XIIDRA) 5 % SOLN Place 1-2 drops into both eyes daily.     Marland Kitchen LORazepam (ATIVAN) 1 MG tablet Take 1 tablet (1 mg total) by mouth every 8 (eight) hours as needed for anxiety. (Patient taking differently: Take 0.5-1 mg by mouth See admin instructions. Taking 1mg  in the AM, 0.5mg  (1/2 tablet) at lunch and 1/2 tablet (0.5mg ) at bedtime.) 90 tablet 0  . magnesium oxide (MAG-OX) 400 (241.3 Mg) MG tablet Take 1 tablet (400 mg total) by mouth 2 (two) times daily. 180 tablet 3  . metoprolol succinate (TOPROL-XL) 25 MG 24 hr tablet TAKE ONE (1) TABLET BY MOUTH EVERY DAY 90 tablet 3  . mupirocin ointment (BACTROBAN) 2 % Apply 1 application topically 2 (two) times daily.    . nitroGLYCERIN (NITROSTAT) 0.4 MG SL tablet Place 1 tablet (0.4 mg total) under the tongue every 5 (five) minutes as needed for chest pain. 25 tablet 2  . pantoprazole (PROTONIX) 40 MG tablet Take one (1) tablet (40 mg) by mouth daily. 90 tablet 3  . predniSONE (DELTASONE) 5 MG tablet Label  & dispense according to the schedule below. take 8 Pills PO for 3 days, 6 Pills PO for 3 days, 4 Pills PO for 3 days, 2 Pills PO for 3 days, 1 Pills PO for 3 days, 1/2 Pill  PO for 3 days then STOP. Total 65 pills. 65 tablet 0  . rosuvastatin (CRESTOR) 5 MG tablet TAKE 5 mg  EVERY DAY 90 tablet 3  . tamsulosin (FLOMAX) 0.4 MG CAPS capsule TAKE 1 CAPSULE EVERY DAY 90 capsule 3   No current facility-administered medications on file prior to visit.    Allergies  Allergen Reactions  . Feldene [Piroxicam] Other (See Comments)    REACTION: Blisters  . Statins Other (See Comments)    Myalgias  . Doxycycline Other (See Comments)     REACTION:  unknown  . Latex Rash  . Tequin Anxiety  . Valium Other (See Comments)    REACTION:  unknown   Social History   Socioeconomic History  . Marital status: Widowed    Spouse name: Not on file  . Number of children: Not on file  . Years of education: Not on file  . Highest education level: Not on file  Occupational History  . Not on file  Social Needs  . Financial resource strain: Not on file  . Food insecurity:    Worry: Not on file    Inability: Not on file  . Transportation needs:    Medical: Not on  file    Non-medical: Not on file  Tobacco Use  . Smoking status: Never Smoker  . Smokeless tobacco: Former Systems developer    Types: Chew  . Tobacco comment: "quit chewing in the 1960s"  Substance and Sexual Activity  . Alcohol use: No  . Drug use: No  . Sexual activity: Never  Lifestyle  . Physical activity:    Days per week: Not on file    Minutes per session: Not on file  . Stress: Not on file  Relationships  . Social connections:    Talks on phone: Not on file    Gets together: Not on file    Attends religious service: Not on file    Active member of club or organization: Not on file    Attends meetings of clubs or organizations: Not on file    Relationship status: Not on file  . Intimate partner violence:    Fear of current or ex partner: Not on file    Emotionally abused: Not on file    Physically abused: Not on file    Forced sexual activity: Not on file  Other Topics Concern  . Not on file  Social History Narrative   01-05-18 Unable to ask abuse questions family with him today.   11-012-19 Unable to ask abuse questions family with him today.      Review of Systems  All other systems reviewed and are negative.      Objective:   Physical Exam Vitals signs reviewed.  Constitutional:      General: He is not in acute distress.    Appearance: He is well-developed. He is not diaphoretic.  HENT:     Right Ear: External ear normal.     Left Ear:  External ear normal.     Nose: Nose normal.     Mouth/Throat:     Pharynx: No oropharyngeal exudate.  Eyes:     Conjunctiva/sclera: Conjunctivae normal.  Neck:     Musculoskeletal: Neck supple.     Vascular: No JVD.  Cardiovascular:     Rate and Rhythm: Normal rate and regular rhythm.     Heart sounds: Normal heart sounds. No murmur.  Pulmonary:     Effort: Pulmonary effort is normal. No respiratory distress.     Breath sounds: No wheezing or rales.  Chest:     Chest wall: No tenderness.  Abdominal:     General: Bowel sounds are normal. There is no distension.     Palpations: Abdomen is soft.     Tenderness: There is no abdominal tenderness. There is no guarding or rebound.  Lymphadenopathy:     Cervical: No cervical adenopathy.    6 cm x 6 cm large hematoma on the right forearm with a dry black necrotic eschar over top of this consistent with dry gangrene.  Hematoma is fluctuant with liquid blood.       Assessment & Plan:  Pruritus  Traumatic hematoma of right forearm, subsequent encounter  It appears that the hematoma has coagulated and is now turned into a large clot.  I am unable to aspirate any blood from the hematoma.  Therefore I placed the patient back in a bandage.  Silvadene is applied to the top of the skin where there is a necrotic eschar to avoid secondary infection.  This is still dry and represents dry gangrene that will gradually flake away I suspect.  This is covered with a nonadherent gauze.  Is then wrapped in calamine impregnated  gauze and wrapped with coban comfortably from the elbow to the wrist.  Recheck the wound on Thursday.  I believe the patient can resume his Lovenox tomorrow as there is no evidence of active bleeding given the failure to aspirate any blood from the hematoma.  Hematoma should gradually reabsorb over a period of weeks to months.

## 2018-06-12 DIAGNOSIS — J449 Chronic obstructive pulmonary disease, unspecified: Secondary | ICD-10-CM | POA: Diagnosis not present

## 2018-06-13 ENCOUNTER — Ambulatory Visit: Payer: Medicare HMO | Admitting: Interventional Cardiology

## 2018-06-14 ENCOUNTER — Other Ambulatory Visit: Payer: Self-pay | Admitting: Interventional Cardiology

## 2018-06-14 ENCOUNTER — Encounter: Payer: Self-pay | Admitting: Family Medicine

## 2018-06-14 ENCOUNTER — Ambulatory Visit (INDEPENDENT_AMBULATORY_CARE_PROVIDER_SITE_OTHER): Payer: Medicare HMO | Admitting: Family Medicine

## 2018-06-14 ENCOUNTER — Other Ambulatory Visit: Payer: Self-pay

## 2018-06-14 ENCOUNTER — Other Ambulatory Visit: Payer: Self-pay | Admitting: Family Medicine

## 2018-06-14 VITALS — BP 126/62 | HR 76 | Temp 97.6°F | Resp 18 | Ht 68.0 in | Wt 175.0 lb

## 2018-06-14 DIAGNOSIS — S5011XD Contusion of right forearm, subsequent encounter: Secondary | ICD-10-CM

## 2018-06-14 DIAGNOSIS — J449 Chronic obstructive pulmonary disease, unspecified: Secondary | ICD-10-CM | POA: Diagnosis not present

## 2018-06-14 MED ORDER — CEPHALEXIN 500 MG PO CAPS: 500.0000 mg | ORAL_CAPSULE | Freq: Four times a day (QID) | ORAL | 0 refills | Status: DC

## 2018-06-14 NOTE — Progress Notes (Signed)
Subjective:    Patient ID: Evan [name], male    DOB: 21-Jan-1928, 85 y.o.   MRN: [id number]  Illness   01/30/18 Patient walked in urgently this afternoon at 4:00 complaining of feeling weak.  Initially a temporal thermometer was used and his temperature was found to be 102.9!.  We repeated this with an oral thermometer and his temperature was found to be 98.8.  Therefore I believe the temperature was incorrect.  I repeated the oral temperature again 20 minutes later and it was 97.8.  Patient's blood pressure initially upon walking and was 98/60.  Therefore we started the patient on normal saline and he was given a 500 cc bolus.  Patient states that he was fine when he went to sleep.  When he awoke from his nap this afternoon, he felt extremely shaky and weak.  His legs felt weak.  His daughter brought him straight to the office.  He states that he has had a cough over the last week that is gradually gotten worse.  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  However he does report increasing cough and increasing weakness over the last week.  He denies any sore throat.  He denies any rhinorrhea.  He denies any otalgia.  He denies any nausea or vomiting.  He denies any chest pain.  He denies any shortness of breath above his baseline.  He denies any dysuria.  He denies any urgency or frequency.  Initial flu test was negative.  At that time, my plan was:  Exam today is unremarkable.  The initial temperature was incorrect.  Repeated oral temperatures have been in a normal range between 97.8 and 98.8.  Therefore I will disregard the initial temporal scan which said 102.9.  Patient's blood pressure has improved to 120/60 after receiving a 500 cc bolus of normal saline.  Flu test is negative.  Given the increasing cough and the crackles on exam I  will treat the patient for possible community-acquired pneumonia.  Patient was given 1 g of Rocephin IM x1 now and I will start him on a Z-Pak.  Patient will return tomorrow for reevaluation to ensure that he is in gradually improving.  CBC and CMP were sent.  Patient is instructed to go to the emergency room if symptoms worsen or change in any way.  02/05/18 Patient return the next day for follow-up.  Given worsening sided chest pain shortness of breath, he was recommended to go to the emergency room.  In the emergency room, CT scan of the chest revealed right for pulmonary embolism.  He was also found to have a left peroneal vein DVT.  He was admitted to the hospital and started on heparin and then transitioned to Lovenox 80 mg subcu twice daily for treatment of the pulmonary embolism.  It was recommended for lifelong anticoagulation given the fact the patient has a history of right-sided lung cancer and also formed DVT and pulmonary embolism while taking Eliquis.  He is here today for follow-up.  His daughter states that he may have missed 1 or 2 doses of Eliquis but he usually took it twice a day.  He was compliant more than 90% of the time.  He is reporting right-sided chest pain.  He is also reporting fatigue and shortness of breath and dyspnea on exertion.  Symptoms have not changed considerably from last week.  He denies any syncope.  He denies any palpitations.  At that time, my plan was: Given his history of lung cancer, and the fact that his DVT and pulmonary embolism formed while taking Eliquis, I recommended that the patient remain on Lovenox indefinitely.  He will discontinue Eliquis and use only Lovenox.  I would recommend 80 mg twice daily for at least the first 6 months.  Then to ensure compliance and for ease of administration, we may consider switching to once daily lovenox but use 1.5 mg/kg daily (120 mg).  I will follow-up lab studies as requested by the hospitalist including a CBC and  CMP.  02/09/18 Patient continues to endorse the same symptoms.  He reports fatigue.  He reports weakness.  He reports that his legs feel shaky.  He reports chest pain that is essentially been present over the last year and is essentially unchanged.  He reports vague shortness of breath.  Physical therapy has not yet coming to his home.  AT that time, my plan was: The majority of the patient's symptoms today are chronic issues he is complained of over the last few years including shortness of breath weakness, feeling shaky.  I believe that these chronic symptoms have recently been exacerbated by his DVT and pulmonary embolism.  I would recheck the patient in 3 months.  Continue Lovenox twice daily for 3 months and then switch to 1.5 mg/kg daily in 3 months.  Monitor MP today to check his renal function along with his magnesium and potassium.  Arrange for home health physical therapy  05/14/18 Patient is here today for follow-up.  He is completed 3 months of Lovenox therapy.  Therefore I would like to switch now to once daily Lovenox 1.5 mg/kg/day which would equal out 220 mg shots.  This would be much more convenient for the patient and the family as they are having to drive to his house twice a day to give him the shots.  Unfortunately he continues to suffer from diffuse itching.  Please see my last few office notes.  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  He is constantly scratching his skin.  He had a previous flare of this several years ago and was ultimately referred to a dermatologist.  Neither the patient nor the family can remember what finally cause the situation to improve.  Recently it started happening again.  He developed a eczema-like rash on his right upper extremity and the flexor crease which did not respond to topical steroids or oral steroids.  He was referred to dermatology who treated him for a secondary staph infection with Keflex.  The staph infection has improved but the  patient continues to scratch all over his body.  There are numerous excoriations and scabs on his back on his shoulders on his arms and on his legs were he is scratching.  Even today during our encounter, almost reflexively, the patient is scratching at his abdomen and his wrist.  Ironically this all started around the same time we wean down on his Prozac.  I believe anxiety could be contributing to a lot of this and exacerbating the neuropathic itch.  I believe some of this is reflux driven as well.  However he is seen no benefit from gabapentin.  He tried hydroxyzine in the past and not had any benefit with the itching.  I put him on prednisone.  Dermatology put him on 2 rounds of  prednisone with no improvement.  I question if this may be psychosomatic coupled with some underlying dry skin or possibly even neuropathic itching.  At that time, my plan was: Difficult case.  I am not sure the cause of the patient's pruritus.  He has seen dermatology now numerous times and has had no benefit in the itching.  I believe some of this may be anxiety driven and psychosomatic coupled with neuropathic itching.  I do believe it is not just a coincidence that this all started to happen once we wean down on his Prozac.  Therefore I have recommended trying to increase the Prozac back to 20 mg a day and using topical capsaicin cream for the itching in case this is neuropathic in nature especially given the fact that none of the topical steroid creams or even oral prednisone has helped.  Could consider increasing gabapentin to a higher dose but I am worried about polypharmacy and falls.  Hydroxyzine was not beneficial.  Could possibly consider amitriptyline.  Reassess in 2 weeks.  Patient will complete all of the prefilled Lovenox ranges he has now which is approximately 3 weeks and then we will switch the patient to 120 mg once a day of Lovenox indefinitely.  06/08/18 Patient was recently admitted to the hospital with cellulitis  in his right forearm.  He also developed a traumatic hematoma to the right forearm after scratching vigorously.  This was treated with aspiration x2 in the hospital and pressure wraps.  He is here today for follow-up.  The cellulitis has improved in the right forearm.  The erythema has faded.  He continues to report general pruritus all over his body per tickly on his right arm.  However the hematoma has now enlarged dramatically.  It is 6 cm x 4 cm.  The skin overlying the hematoma is hard and now black showing a necrotic eschar consistent with dry gangrene.  I leave it is only a matter of time before this hard necrotic shell will rupture causing uncontrolled bleeding.  There is fluctuant blood underneath the skin that is still accumulating.  The patient is still taking Lovenox 80 mg twice daily.  At that time, my plan was: As the dry gangrene breaks down, patient will begin to bleed.  Therefore I placed the patient in a compression dressing.  I applied Silvadene cream to the skin.  I covered it with nonadherent gauze.  I then wrapped the patient with calamine impregnated gauze as a pressure dressing and then wrapped it with Coban similar to an Haematologist.  This was applied in a fashion consistent with a short arm cast from the elbow to the hand.  Pictures were taken of the hematoma prior to dressing.  I have recommended the patient discontinue Lovenox as I feel there is still active bleeding.  Recheck the patient on Monday.  Continue Keflex for now to cover cellulitis.  May need another therapeutic aspiration and repeat pressure dressing on Monday if the hematoma is not beginning to reabsorb and shrink and firm up.  Consult dermatology at [facility] given general pruritus.  06/11/18 There is blood that has drained through the sides of the bandage.  However there is no soaking blood.  Unna boot bandages removed from his forearm.  There continues to be a large fluctuant hematoma.  It is 6 cm x 6 cm.  There  continues to be a necrotic eschar over the top of the hematoma.  However there is no evidence of  cellulitis.  I used an 18-gauge needle and attempted to aspirate the blood from the hematoma so that I could more effectively apply pressure dressing.  I was unable to aspirate any blood despite introducing the needle directly into the hematoma.  I then repositioned 180 degrees around the hematoma and attempted the same aspiration from the other side.  I was unsuccessful in aspirating any blood suggesting this time it has now clotted and coagulated suggesting against any active bleeding.  Therefore I believe the large mass is likely a blood clot.  In fact no blood came through the aspiration site.  I believe stopping the Lovenox has allow coagulation and hemostasis.  Therefore the patient was placed back in bandaging.  At that time, my plan was: It appears that the hematoma has coagulated and is now turned into a large clot.  I am unable to aspirate any blood from the hematoma.  Therefore I placed the patient back in a bandage.  Silvadene is applied to the top of the skin where there is a necrotic eschar to avoid secondary infection.  This is still dry and represents dry gangrene that will gradually flake away I suspect.  This is covered with a nonadherent gauze.  Is then wrapped in calamine impregnated gauze and wrapped with coban comfortably from the elbow to the wrist.  Recheck the wound on Thursday.  I believe the patient can resume his Lovenox tomorrow as there is no evidence of active bleeding given the failure to aspirate any blood from the hematoma.  Hematoma should gradually reabsorb over a period of weeks to months.  06/14/18 Patient is here today for a wound check.  There is still a large sick centimeter by 6 cm very superficial hematoma on the dorsal surface of his right forearm.  There is a black necrotic eschar of dead skin directly over top of the hematoma.  However there is no visible bleeding.  There  is no evidence of cellulitis.  The skin is already starting to breakdown over the right lateral aspect of the hematoma.  Underneath is a dark purple-black clot that is visibly opposed.  Patient has been back on Lovenox now for 48 hours with no enlargement of the hematoma no pain no extension of the bleeding.  Past Medical History:  Diagnosis Date   Adenocarcinoma of right lung ([place]) 01/06/2016   Anginal chest pain at rest [facility])    Chronic non, controlled on Lorazepam   Anxiety    Arthritis    "all over"    BPH (benign prostatic hyperplasia)    CAD (coronary artery disease)    Chronic lower back pain    Complication of anesthesia    "problems making water afterwards"   Depression    DJD (degenerative joint disease)    Esophageal ulcer without bleeding    bid ppi indefinitely   Family history of adverse reaction to anesthesia    "children w/PONV"   GERD (gastroesophageal reflux disease)    Headache    "couple/week maybe" (09/04/2015)   Hemochromatosis    Possible, elevated iron stores, hook-like osteophytes on hand films, normal LFTs   Hepatitis    "yellow jaundice as a baby"   History of ASCVD    MULTIVESSEL   Hyperlipidemia    Hypertension    Myocardial infarction ([place]) 1999   Osteoarthritis    Pneumonia    "several times; got a little now" (09/04/2015)   Rheumatoid arthritis ([place])    Subdural hematoma ([place])  small after fall 09/2016-plavix held, neurosurgery consulted.  Asymptomatic.     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[place]) 02/2018   on Lovenox lifelong since he failed PO Eliquis   Past Surgical History:  Procedure Laterality Date   ARM SKIN LESION BIOPSY / EXCISION Left 09/02/2015   CATARACT EXTRACTION W/ INTRAOCULAR LENS  IMPLANT, BILATERAL Bilateral    CHOLECYSTECTOMY OPEN     CORNEAL TRANSPLANT Bilateral    "one at [facility]; one at [facility]"   [place] W/ STENT     DE stent ostium into the right radial free graft at [facility]-, 02-2007   ESOPHAGOGASTRODUODENOSCOPY N/A 11/09/2014   Procedure: ESOPHAGOGASTRODUODENOSCOPY (EGD);  Surgeon: [name], MD;  Location: [facility] ENDOSCOPY;  Service: Endoscopy;  Laterality: N/A;   INGUINAL HERNIA REPAIR     JOINT REPLACEMENT     LEFT HEART CATH AND CORS/GRAFTS ANGIOGRAPHY N/A 06/27/2017   Procedure: LEFT HEART CATH AND CORS/GRAFTS ANGIOGRAPHY;  Surgeon: [name], MD;  Location: [place] CV LAB;  Service: Cardiovascular;  Laterality: N/A;   NASAL SINUS SURGERY     SHOULDER OPEN ROTATOR CUFF REPAIR Right    TOTAL KNEE ARTHROPLASTY Bilateral    Current Outpatient Medications on File Prior to Visit  Medication Sig Dispense Refill   acetaminophen (TYLENOL) 500 MG tablet Take 1,000 mg by mouth every 6 (six) hours as needed for mild pain.     camphor-menthol (SARNA) lotion Apply topically as needed for itching. 222 mL 0   cephALEXin (KEFLEX) 500 MG capsule Take 1 capsule (500 mg total) by mouth 4 (four) times daily. 28 capsule 0   Cholecalciferol 2000 units TABS Take 1 tablet (2,000 Units total) by mouth daily. 30 each 11   enoxaparin (LOVENOX) 80 MG/0.8ML injection Inject 0.8 mLs (80 mg total) into the skin every 12 (twelve) hours.     ENSURE (ENSURE) Take 237 mLs by mouth 2 (two) times daily between meals.     FLUoxetine (PROZAC) 20 MG tablet Take 20 mg by mouth daily.     furosemide (LASIX) 20 MG tablet Take 1 tablet (20 mg total) once a day. Take an extra tablet (20 mg total) for increased swelling, weight gain, or shortness of breath. 180 tablet 3   hydrOXYzine (ATARAX/VISTARIL) 25 MG tablet Take 1 tablet (25 mg total) by mouth every 8 (eight) hours as needed for itching. 20 tablet 0   Lifitegrast (XIIDRA) 5 % SOLN Place 1-2 drops into both eyes daily.      LORazepam (ATIVAN) 1 MG tablet Take 1 tablet (1 mg total) by mouth every 8 (eight) hours as needed for anxiety. (Patient taking  differently: Take 0.5-1 mg by mouth See admin instructions. Taking 1mg  in the AM, 0.5mg  (1/2 tablet) at lunch and 1/2 tablet (0.5mg ) at bedtime.) 90 tablet 0   magnesium oxide (MAG-OX) 400 (241.3 Mg) MG tablet Take 1 tablet (400 mg total) by mouth 2 (two) times daily. 180 tablet 3   metoprolol succinate (TOPROL-XL) 25 MG 24 hr tablet TAKE ONE (1) TABLET BY MOUTH EVERY DAY 90 tablet 3   mupirocin ointment (BACTROBAN) 2 % Apply 1 application topically 2 (two) times daily.     nitroGLYCERIN (NITROSTAT) 0.4 MG SL tablet Place 1 tablet (0.4 mg total) under the tongue every 5 (five) minutes as needed for chest pain. 25 tablet 2   pantoprazole (PROTONIX) 40 MG tablet Take one (1) tablet (40 mg)  by mouth daily. 90 tablet 3   predniSONE (DELTASONE) 5 MG tablet Label  & dispense according to the schedule below. take 8 Pills PO for 3 days, 6 Pills PO for 3 days, 4 Pills PO for 3 days, 2 Pills PO for 3 days, 1 Pills PO for 3 days, 1/2 Pill  PO for 3 days then STOP. Total 65 pills. 65 tablet 0   rosuvastatin (CRESTOR) 5 MG tablet TAKE 5 mg  EVERY DAY 90 tablet 3   tamsulosin (FLOMAX) 0.4 MG CAPS capsule TAKE 1 CAPSULE EVERY DAY 90 capsule 3   No current facility-administered medications on file prior to visit.    Allergies  Allergen Reactions   Feldene [Piroxicam] Other (See Comments)    REACTION: Blisters   Statins Other (See Comments)    Myalgias   Doxycycline Other (See Comments)    REACTION:  unknown   Latex Rash   Tequin Anxiety   Valium Other (See Comments)    REACTION:  unknown   Social History   Socioeconomic History   Marital status: Widowed    Spouse name: Not on file   Number of children: Not on file   Years of education: Not on file   Highest education level: Not on file  Occupational History   Not on file  Social Needs   Financial resource strain: Not on file   Food insecurity:    Worry: Not on file    Inability: Not on file   Transportation needs:     Medical: Not on file    Non-medical: Not on file  Tobacco Use   Smoking status: Never Smoker   Smokeless tobacco: Former Systems developer    Types: Chew   Tobacco comment: "quit chewing in the 1960s"  Substance and Sexual Activity   Alcohol use: No   Drug use: No   Sexual activity: Never  Lifestyle   Physical activity:    Days per week: Not on file    Minutes per session: Not on file   Stress: Not on file  Relationships   Social connections:    Talks on phone: Not on file    Gets together: Not on file    Attends religious service: Not on file    Active member of club or organization: Not on file    Attends meetings of clubs or organizations: Not on file    Relationship status: Not on file   Intimate partner violence:    Fear of current or ex partner: Not on file    Emotionally abused: Not on file    Physically abused: Not on file    Forced sexual activity: Not on file  Other Topics Concern   Not on file  Social History Narrative   01-05-18 Unable to ask abuse questions family with him today.   11-012-19 Unable to ask abuse questions family with him today.      Review of Systems  All other systems reviewed and are negative.      Objective:   Physical Exam Vitals signs reviewed.  Constitutional:      General: He is not in acute distress.    Appearance: He is well-developed. He is not diaphoretic.  HENT:     Right Ear: External ear normal.     Left Ear: External ear normal.     Nose: Nose normal.     Mouth/Throat:     Pharynx: No oropharyngeal exudate.  Eyes:     Conjunctiva/sclera: Conjunctivae normal.  Neck:  Musculoskeletal: Neck supple.     Vascular: No JVD.  Cardiovascular:     Rate and Rhythm: Normal rate and regular rhythm.     Heart sounds: Normal heart sounds. No murmur.  Pulmonary:     Effort: Pulmonary effort is normal. No respiratory distress.     Breath sounds: No wheezing or rales.  Chest:     Chest wall: No tenderness.  Abdominal:      General: Bowel sounds are normal. There is no distension.     Palpations: Abdomen is soft.     Tenderness: There is no abdominal tenderness. There is no guarding or rebound.  Lymphadenopathy:     Cervical: No cervical adenopathy.    6 cm x 6 cm large hematoma on the right forearm with a dry black necrotic eschar over top of this consistent with dry gangrene.        Assessment & Plan:  Traumatic hematoma of right forearm, subsequent encounter  At this point, I am going to have home health wound care come out and start 2-3 times a week dressing changes.  I wonder if the patient would benefit from enzymatic debridement of the necrotic eschar and thick hematoma perhaps with a collagenase.  I will defer this question to the home wound care specialist.  Meanwhile I will cover the wound with Silvadene, nonadherent gauze, and wrapped the wound in the right forearm with Kerlix.  This was then covered with an Ace bandage to avoid secondary infection.  I would like to see the patient back on a weekly basis to assess for healing while wound care is visiting his home and providing dressing changes

## 2018-06-14 NOTE — Telephone Encounter (Signed)
Ok to refill??  Last office visit 06/14/2018.  Last refill 04/03/2018.

## 2018-06-15 ENCOUNTER — Other Ambulatory Visit: Payer: Self-pay | Admitting: Family Medicine

## 2018-06-15 DIAGNOSIS — I2699 Other pulmonary embolism without acute cor pulmonale: Secondary | ICD-10-CM | POA: Diagnosis not present

## 2018-06-15 DIAGNOSIS — C3490 Malignant neoplasm of unspecified part of unspecified bronchus or lung: Secondary | ICD-10-CM | POA: Diagnosis not present

## 2018-06-15 DIAGNOSIS — S40021D Contusion of right upper arm, subsequent encounter: Secondary | ICD-10-CM | POA: Diagnosis not present

## 2018-06-15 DIAGNOSIS — I482 Chronic atrial fibrillation, unspecified: Secondary | ICD-10-CM | POA: Diagnosis not present

## 2018-06-15 DIAGNOSIS — L03113 Cellulitis of right upper limb: Secondary | ICD-10-CM | POA: Diagnosis not present

## 2018-06-15 DIAGNOSIS — I110 Hypertensive heart disease with heart failure: Secondary | ICD-10-CM | POA: Diagnosis not present

## 2018-06-15 DIAGNOSIS — R21 Rash and other nonspecific skin eruption: Secondary | ICD-10-CM | POA: Diagnosis not present

## 2018-06-15 MED ORDER — LORAZEPAM 1 MG PO TABS: 0.5000 mg | ORAL_TABLET | ORAL | 0 refills | Status: DC

## 2018-06-15 MED ORDER — LORAZEPAM 1 MG PO TABS: ORAL_TABLET | ORAL | 0 refills | Status: DC

## 2018-06-15 NOTE — Telephone Encounter (Signed)
Needs rx sent to pharm - it printed. Please resend

## 2018-06-18 ENCOUNTER — Ambulatory Visit (INDEPENDENT_AMBULATORY_CARE_PROVIDER_SITE_OTHER): Payer: Medicare HMO | Admitting: Family Medicine

## 2018-06-18 ENCOUNTER — Other Ambulatory Visit: Payer: Self-pay

## 2018-06-18 VITALS — BP 132/80 | HR 72 | Temp 97.6°F | Resp 14 | Ht 68.0 in | Wt 177.0 lb

## 2018-06-18 DIAGNOSIS — T148XXA Other injury of unspecified body region, initial encounter: Secondary | ICD-10-CM | POA: Diagnosis not present

## 2018-06-18 NOTE — Progress Notes (Signed)
Subjective:    Patient ID: Evan [name], male    DOB: 1927-07-17, 85 y.o.   MRN: [id number]  Illness   01/30/18 Patient walked in urgently this afternoon at 4:00 complaining of feeling weak.  Initially a temporal thermometer was used and his temperature was found to be 102.9!.  We repeated this with an oral thermometer and his temperature was found to be 98.8.  Therefore I believe the temperature was incorrect.  I repeated the oral temperature again 20 minutes later and it was 97.8.  Patient's blood pressure initially upon walking and was 98/60.  Therefore we started the patient on normal saline and he was given a 500 cc bolus.  Patient states that he was fine when he went to sleep.  When he awoke from his nap this afternoon, he felt extremely shaky and weak.  His legs felt weak.  His daughter brought him straight to the office.  He states that he has had a cough over the last week that is gradually gotten worse.  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  However he does report increasing cough and increasing weakness over the last week.  He denies any sore throat.  He denies any rhinorrhea.  He denies any otalgia.  He denies any nausea or vomiting.  He denies any chest pain.  He denies any shortness of breath above his baseline.  He denies any dysuria.  He denies any urgency or frequency.  Initial flu test was negative.  At that time, my plan was:  Exam today is unremarkable.  The initial temperature was incorrect.  Repeated oral temperatures have been in a normal range between 97.8 and 98.8.  Therefore I will disregard the initial temporal scan which said 102.9.  Patient's blood pressure has improved to 120/60 after receiving a 500 cc bolus of normal saline.  Flu test is negative.  Given the increasing cough and the crackles on exam I  will treat the patient for possible community-acquired pneumonia.  Patient was given 1 g of Rocephin IM x1 now and I will start him on a Z-Pak.  Patient will return tomorrow for reevaluation to ensure that he is in gradually improving.  CBC and CMP were sent.  Patient is instructed to go to the emergency room if symptoms worsen or change in any way.  02/05/18 Patient return the next day for follow-up.  Given worsening sided chest pain shortness of breath, he was recommended to go to the emergency room.  In the emergency room, CT scan of the chest revealed right for pulmonary embolism.  He was also found to have a left peroneal vein DVT.  He was admitted to the hospital and started on heparin and then transitioned to Lovenox 80 mg subcu twice daily for treatment of the pulmonary embolism.  It was recommended for lifelong anticoagulation given the fact the patient has a history of right-sided lung cancer and also formed DVT and pulmonary embolism while taking Eliquis.  He is here today for follow-up.  His daughter states that he may have missed 1 or 2 doses of Eliquis but he usually took it twice a day.  He was compliant more than 90% of the time.  He is reporting right-sided chest pain.  He is also reporting fatigue and shortness of breath and dyspnea on exertion.  Symptoms have not changed considerably from last week.  He denies any syncope.  He denies any palpitations.  At that time, my plan was: Given his history of lung cancer, and the fact that his DVT and pulmonary embolism formed while taking Eliquis, I recommended that the patient remain on Lovenox indefinitely.  He will discontinue Eliquis and use only Lovenox.  I would recommend 80 mg twice daily for at least the first 6 months.  Then to ensure compliance and for ease of administration, we may consider switching to once daily lovenox but use 1.5 mg/kg daily (120 mg).  I will follow-up lab studies as requested by the hospitalist including a CBC and  CMP.  02/09/18 Patient continues to endorse the same symptoms.  He reports fatigue.  He reports weakness.  He reports that his legs feel shaky.  He reports chest pain that is essentially been present over the last year and is essentially unchanged.  He reports vague shortness of breath.  Physical therapy has not yet coming to his home.  AT that time, my plan was: The majority of the patient's symptoms today are chronic issues he is complained of over the last few years including shortness of breath weakness, feeling shaky.  I believe that these chronic symptoms have recently been exacerbated by his DVT and pulmonary embolism.  I would recheck the patient in 3 months.  Continue Lovenox twice daily for 3 months and then switch to 1.5 mg/kg daily in 3 months.  Monitor MP today to check his renal function along with his magnesium and potassium.  Arrange for home health physical therapy  05/14/18 Patient is here today for follow-up.  He is completed 3 months of Lovenox therapy.  Therefore I would like to switch now to once daily Lovenox 1.5 mg/kg/day which would equal out 220 mg shots.  This would be much more convenient for the patient and the family as they are having to drive to his house twice a day to give him the shots.  Unfortunately he continues to suffer from diffuse itching.  Please see my last few office notes.  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  He is constantly scratching his skin.  He had a previous flare of this several years ago and was ultimately referred to a dermatologist.  Neither the patient nor the family can remember what finally cause the situation to improve.  Recently it started happening again.  He developed a eczema-like rash on his right upper extremity and the flexor crease which did not respond to topical steroids or oral steroids.  He was referred to dermatology who treated him for a secondary staph infection with Keflex.  The staph infection has improved but the  patient continues to scratch all over his body.  There are numerous excoriations and scabs on his back on his shoulders on his arms and on his legs were he is scratching.  Even today during our encounter, almost reflexively, the patient is scratching at his abdomen and his wrist.  Ironically this all started around the same time we wean down on his Prozac.  I believe anxiety could be contributing to a lot of this and exacerbating the neuropathic itch.  I believe some of this is reflux driven as well.  However he is seen no benefit from gabapentin.  He tried hydroxyzine in the past and not had any benefit with the itching.  I put him on prednisone.  Dermatology put him on 2 rounds of  prednisone with no improvement.  I question if this may be psychosomatic coupled with some underlying dry skin or possibly even neuropathic itching.  At that time, my plan was: Difficult case.  I am not sure the cause of the patient's pruritus.  He has seen dermatology now numerous times and has had no benefit in the itching.  I believe some of this may be anxiety driven and psychosomatic coupled with neuropathic itching.  I do believe it is not just a coincidence that this all started to happen once we wean down on his Prozac.  Therefore I have recommended trying to increase the Prozac back to 20 mg a day and using topical capsaicin cream for the itching in case this is neuropathic in nature especially given the fact that none of the topical steroid creams or even oral prednisone has helped.  Could consider increasing gabapentin to a higher dose but I am worried about polypharmacy and falls.  Hydroxyzine was not beneficial.  Could possibly consider amitriptyline.  Reassess in 2 weeks.  Patient will complete all of the prefilled Lovenox ranges he has now which is approximately 3 weeks and then we will switch the patient to 120 mg once a day of Lovenox indefinitely.  06/08/18 Patient was recently admitted to the hospital with cellulitis  in his right forearm.  He also developed a traumatic hematoma to the right forearm after scratching vigorously.  This was treated with aspiration x2 in the hospital and pressure wraps.  He is here today for follow-up.  The cellulitis has improved in the right forearm.  The erythema has faded.  He continues to report general pruritus all over his body per tickly on his right arm.  However the hematoma has now enlarged dramatically.  It is 6 cm x 4 cm.  The skin overlying the hematoma is hard and now black showing a necrotic eschar consistent with dry gangrene.  I leave it is only a matter of time before this hard necrotic shell will rupture causing uncontrolled bleeding.  There is fluctuant blood underneath the skin that is still accumulating.  The patient is still taking Lovenox 80 mg twice daily.  At that time, my plan was: As the dry gangrene breaks down, patient will begin to bleed.  Therefore I placed the patient in a compression dressing.  I applied Silvadene cream to the skin.  I covered it with nonadherent gauze.  I then wrapped the patient with calamine impregnated gauze as a pressure dressing and then wrapped it with Coban similar to an Haematologist.  This was applied in a fashion consistent with a short arm cast from the elbow to the hand.  Pictures were taken of the hematoma prior to dressing.  I have recommended the patient discontinue Lovenox as I feel there is still active bleeding.  Recheck the patient on Monday.  Continue Keflex for now to cover cellulitis.  May need another therapeutic aspiration and repeat pressure dressing on Monday if the hematoma is not beginning to reabsorb and shrink and firm up.  Consult dermatology at [facility] given general pruritus.  06/11/18 There is blood that has drained through the sides of the bandage.  However there is no soaking blood.  Unna boot bandages removed from his forearm.  There continues to be a large fluctuant hematoma.  It is 6 cm x 6 cm.  There  continues to be a necrotic eschar over the top of the hematoma.  However there is no evidence of  cellulitis.  I used an 18-gauge needle and attempted to aspirate the blood from the hematoma so that I could more effectively apply pressure dressing.  I was unable to aspirate any blood despite introducing the needle directly into the hematoma.  I then repositioned 180 degrees around the hematoma and attempted the same aspiration from the other side.  I was unsuccessful in aspirating any blood suggesting this time it has now clotted and coagulated suggesting against any active bleeding.  Therefore I believe the large mass is likely a blood clot.  In fact no blood came through the aspiration site.  I believe stopping the Lovenox has allow coagulation and hemostasis.  Therefore the patient was placed back in bandaging.  At that time, my plan was: It appears that the hematoma has coagulated and is now turned into a large clot.  I am unable to aspirate any blood from the hematoma.  Therefore I placed the patient back in a bandage.  Silvadene is applied to the top of the skin where there is a necrotic eschar to avoid secondary infection.  This is still dry and represents dry gangrene that will gradually flake away I suspect.  This is covered with a nonadherent gauze.  Is then wrapped in calamine impregnated gauze and wrapped with coban comfortably from the elbow to the wrist.  Recheck the wound on Thursday.  I believe the patient can resume his Lovenox tomorrow as there is no evidence of active bleeding given the failure to aspirate any blood from the hematoma.  Hematoma should gradually reabsorb over a period of weeks to months.  06/14/18 Patient is here today for a wound check.  There is still a large sick centimeter by 6 cm very superficial hematoma on the dorsal surface of his right forearm.  There is a black necrotic eschar of dead skin directly over top of the hematoma.  However there is no visible bleeding.  There  is no evidence of cellulitis.  The skin is already starting to breakdown over the right lateral aspect of the hematoma.  Underneath is a dark purple-black clot that is visibly opposed.  Patient has been back on Lovenox now for 48 hours with no enlargement of the hematoma no pain no extension of the bleeding.  At that time, my plan was: At this point, I am going to have home health wound care come out and start 2-3 times a week dressing changes.  I wonder if the patient would benefit from enzymatic debridement of the necrotic eschar and thick hematoma perhaps with a collagenase.  I will defer this question to the home wound care specialist.  Meanwhile I will cover the wound with Silvadene, nonadherent gauze, and wrapped the wound in the right forearm with Kerlix.  This was then covered with an Ace bandage to avoid secondary infection.  I would like to see the patient back on a weekly basis to assess for healing while wound care is visiting his home and providing dressing changes  06/18/18 As anticipated, the necrotic eschar over top of the coagulated hematoma ruptured and sloughed off.  This occurred over the weekend.  This leaves residual clotted purple hematoma and open ulcer this is roughly 6 cm in diameter.  Evidence of secondary cellulitis.  Using a pair of scissors, I bluntly removed the evulsed necrotic thick dead skin.  This flap of skin was approximately 4 cm in diameter.  Under this, is roughly 6 cm in diameter of thick clotted blood perhaps 1/4  inch deep.  Past Medical History:  Diagnosis Date   Adenocarcinoma of right lung ([place]) 01/06/2016   Anginal chest pain at rest [facility])    Chronic non, controlled on Lorazepam   Anxiety    Arthritis    "all over"    BPH (benign prostatic hyperplasia)    CAD (coronary artery disease)    Chronic lower back pain    Complication of anesthesia    "problems making water afterwards"   Depression    DJD (degenerative joint disease)    Esophageal  ulcer without bleeding    bid ppi indefinitely   Family history of adverse reaction to anesthesia    "children w/PONV"   GERD (gastroesophageal reflux disease)    Headache    "couple/week maybe" (09/04/2015)   Hemochromatosis    Possible, elevated iron stores, hook-like osteophytes on hand films, normal LFTs   Hepatitis    "yellow jaundice as a baby"   History of ASCVD    MULTIVESSEL   Hyperlipidemia    Hypertension    Myocardial infarction ([place]) 1999   Osteoarthritis    Pneumonia    "several times; got a little now" (09/04/2015)   Rheumatoid arthritis ([place])    Subdural hematoma ([place])    small after fall 09/2016-plavix held, neurosurgery consulted.  Asymptomatic.     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 since he failed PO Eliquis   Past Surgical History:  Procedure Laterality Date   ARM SKIN LESION BIOPSY / EXCISION Left 09/02/2015   CATARACT EXTRACTION W/ INTRAOCULAR LENS  IMPLANT, BILATERAL Bilateral    CHOLECYSTECTOMY OPEN     CORNEAL TRANSPLANT Bilateral    "one at [facility]; one at [facility]"   [place] W/ STENT     DE stent ostium into the right radial free graft at [facility]-, 02-2007   ESOPHAGOGASTRODUODENOSCOPY N/A 11/09/2014   Procedure: ESOPHAGOGASTRODUODENOSCOPY (EGD);  Surgeon: [name], MD;  Location: [facility] ENDOSCOPY;  Service: Endoscopy;  Laterality: N/A;   INGUINAL HERNIA REPAIR     JOINT REPLACEMENT     LEFT HEART CATH AND CORS/GRAFTS ANGIOGRAPHY N/A 06/27/2017   Procedure: LEFT HEART CATH AND CORS/GRAFTS ANGIOGRAPHY;  Surgeon: [name], MD;  Location: [facility] LAB;  Service: Cardiovascular;  Laterality: N/A;   NASAL SINUS SURGERY     SHOULDER OPEN ROTATOR CUFF REPAIR Right    TOTAL KNEE ARTHROPLASTY Bilateral    Current Outpatient Medications on File Prior to Visit  Medication Sig Dispense Refill   acetaminophen  (TYLENOL) 500 MG tablet Take 1,000 mg by mouth every 6 (six) hours as needed for mild pain.     camphor-menthol (SARNA) lotion Apply topically as needed for itching. 222 mL 0   cephALEXin (KEFLEX) 500 MG capsule Take 1 capsule (500 mg total) by mouth 4 (four) times daily. 28 capsule 0   cephALEXin (KEFLEX) 500 MG capsule Take 1 capsule (500 mg total) by mouth 4 (four) times daily. 28 capsule 0   Cholecalciferol 2000 units TABS Take 1 tablet (2,000 Units total) by mouth daily. 30 each 11   enoxaparin (LOVENOX) 80 MG/0.8ML injection Inject 0.8 mLs (80 mg total) into the skin every 12 (twelve) hours.     ENSURE (ENSURE) Take 237 mLs by mouth 2 (two) times daily between meals.     FLUoxetine (PROZAC) 20 MG tablet Take 20 mg by mouth daily.  furosemide (LASIX) 20 MG tablet Take 1 tablet (20 mg total) once a day. Take an extra tablet (20 mg total) for increased swelling, weight gain, or shortness of breath. 180 tablet 3   hydrOXYzine (ATARAX/VISTARIL) 25 MG tablet Take 1 tablet (25 mg total) by mouth every 8 (eight) hours as needed for itching. 20 tablet 0   Lifitegrast (XIIDRA) 5 % SOLN Place 1-2 drops into both eyes daily.      LORazepam (ATIVAN) 1 MG tablet Taking 1mg  in the AM, 0.5mg  (1/2 tablet) at lunch and 1/2 tablet (0.5mg ) at bedtime. 90 tablet 0   magnesium oxide (MAG-OX) 400 (241.3 Mg) MG tablet Take 1 tablet (400 mg total) by mouth 2 (two) times daily. 180 tablet 3   metoprolol succinate (TOPROL-XL) 25 MG 24 hr tablet TAKE ONE (1) TABLET BY MOUTH EVERY DAY 90 tablet 3   mupirocin ointment (BACTROBAN) 2 % Apply 1 application topically 2 (two) times daily.     nitroGLYCERIN (NITROSTAT) 0.4 MG SL tablet Place 1 tablet (0.4 mg total) under the tongue every 5 (five) minutes as needed for chest pain. 25 tablet 2   pantoprazole (PROTONIX) 40 MG tablet Take one (1) tablet (40 mg) by mouth daily. 90 tablet 3   predniSONE (DELTASONE) 5 MG tablet Label  & dispense according to the  schedule below. take 8 Pills PO for 3 days, 6 Pills PO for 3 days, 4 Pills PO for 3 days, 2 Pills PO for 3 days, 1 Pills PO for 3 days, 1/2 Pill  PO for 3 days then STOP. Total 65 pills. 65 tablet 0   rosuvastatin (CRESTOR) 5 MG tablet TAKE ONE (1) TABLET BY MOUTH EVERY DAY 90 tablet 3   tamsulosin (FLOMAX) 0.4 MG CAPS capsule TAKE 1 CAPSULE EVERY DAY 90 capsule 3   No current facility-administered medications on file prior to visit.    Allergies  Allergen Reactions   Feldene [Piroxicam] Other (See Comments)    REACTION: Blisters   Statins Other (See Comments)    Myalgias   Doxycycline Other (See Comments)    REACTION:  unknown   Latex Rash   Tequin Anxiety   Valium Other (See Comments)    REACTION:  unknown   Social History   Socioeconomic History   Marital status: Widowed    Spouse name: Not on file   Number of children: Not on file   Years of education: Not on file   Highest education level: Not on file  Occupational History   Not on file  Social Needs   Financial resource strain: Not on file   Food insecurity:    Worry: Not on file    Inability: Not on file   Transportation needs:    Medical: Not on file    Non-medical: Not on file  Tobacco Use   Smoking status: Never Smoker   Smokeless tobacco: Former Systems developer    Types: Chew   Tobacco comment: "quit chewing in the 1960s"  Substance and Sexual Activity   Alcohol use: No   Drug use: No   Sexual activity: Never  Lifestyle   Physical activity:    Days per week: Not on file    Minutes per session: Not on file   Stress: Not on file  Relationships   Social connections:    Talks on phone: Not on file    Gets together: Not on file    Attends religious service: Not on file    Active member of club  or organization: Not on file    Attends meetings of clubs or organizations: Not on file    Relationship status: Not on file   Intimate partner violence:    Fear of current or ex partner: Not on  file    Emotionally abused: Not on file    Physically abused: Not on file    Forced sexual activity: Not on file  Other Topics Concern   Not on file  Social History Narrative   01-05-18 Unable to ask abuse questions family with him today.   11-012-19 Unable to ask abuse questions family with him today.      Review of Systems  All other systems reviewed and are negative.      Objective:   Physical Exam Vitals signs reviewed.  Constitutional:      General: He is not in acute distress.    Appearance: He is well-developed. He is not diaphoretic.  HENT:     Right Ear: External ear normal.     Left Ear: External ear normal.     Nose: Nose normal.     Mouth/Throat:     Pharynx: No oropharyngeal exudate.  Eyes:     Conjunctiva/sclera: Conjunctivae normal.  Neck:     Musculoskeletal: Neck supple.     Vascular: No JVD.  Cardiovascular:     Rate and Rhythm: Normal rate and regular rhythm.     Heart sounds: Normal heart sounds. No murmur.  Pulmonary:     Effort: Pulmonary effort is normal. No respiratory distress.     Breath sounds: No wheezing or rales.  Chest:     Chest wall: No tenderness.  Abdominal:     General: Bowel sounds are normal. There is no distension.     Palpations: Abdomen is soft.     Tenderness: There is no abdominal tenderness. There is no guarding or rebound.  Lymphadenopathy:     Cervical: No cervical adenopathy.    6 cm x 6 cm large hematoma on the right forearm with a dry black necrotic eschar over top of this consistent with dry gangrene.  The dry gangrenous portion of skin is approximately 4 cm in diameter.  This is partially avulsed more than 90% and is hanging like a flap from the distal portion of the wound.  This was removed bluntly with a pair of scissors.  The remaining wound is now 6 cm in diameter with visibly exposed clotted blood.      Assessment & Plan:  Traumatic hematoma - Plan: Ambulatory referral to Wound Clinic  Silvadene was  applied to the 6 cm open wound on the dorsal surface of his right forearm.  This was covered with nonadherent gauze, 4 x 4 gauze to provide pressure, and then wrapped in an Unna boot fashion loosely with calamine impregnated gauze and then coban.  Dressing resembles a short arm cast.  I have recommended replacing this dressing on Thursday.  I believe the patient may require additional debridement of the hematoma to avoid secondary infection and dressing changes.  I believe this is outside of the purview of [facility] home health wound care nurse.  Therefore I recommended a wound clinic.  I will arrange this consultation as soon as possible.  Recheck the patient on Thursday

## 2018-06-21 ENCOUNTER — Ambulatory Visit (INDEPENDENT_AMBULATORY_CARE_PROVIDER_SITE_OTHER): Payer: Medicare HMO | Admitting: Family Medicine

## 2018-06-21 ENCOUNTER — Other Ambulatory Visit: Payer: Self-pay

## 2018-06-21 VITALS — BP 124/76 | HR 80 | Temp 97.6°F | Resp 18 | Wt 171.0 lb

## 2018-06-21 DIAGNOSIS — L98492 Non-pressure 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 fat layer exposed: Secondary | ICD-10-CM

## 2018-06-21 DIAGNOSIS — T148XXA Other injury of unspecified body region, initial encounter: Secondary | ICD-10-CM | POA: Diagnosis not present

## 2018-06-21 NOTE — Progress Notes (Signed)
Subjective:    Patient ID: Evan [name], male    DOB: 10-Oct-1927, 85 y.o.   MRN: [id number]   06/08/18 Patient was recently admitted to the hospital with cellulitis in his right forearm.  He also developed a traumatic hematoma to the right forearm after scratching vigorously.  This was treated with aspiration x2 in the hospital and pressure wraps.  He is here today for follow-up.  The cellulitis has improved in the right forearm.  The erythema has faded.  He continues to report general pruritus all over his body per tickly on his right arm.  However the hematoma has now enlarged dramatically.  It is 6 cm x 4 cm.  The skin overlying the hematoma is hard and now black showing a necrotic eschar consistent with dry gangrene.  I leave it is only a matter of time before this hard necrotic shell will rupture causing uncontrolled bleeding.  There is fluctuant blood underneath the skin that is still accumulating.  The patient is still taking Lovenox 80 mg twice daily.  At that time, my plan was: As the dry gangrene breaks down, patient will begin to bleed.  Therefore I placed the patient in a compression dressing.  I applied Silvadene cream to the skin.  I covered it with nonadherent gauze.  I then wrapped the patient with calamine impregnated gauze as a pressure dressing and then wrapped it with Coban similar to an Haematologist.  This was applied in a fashion consistent with a short arm cast from the elbow to the hand.  Pictures were taken of the hematoma prior to dressing.  I have recommended the patient discontinue Lovenox as I feel there is still active bleeding.  Recheck the patient on Monday.  Continue Keflex for now to cover cellulitis.  May need another therapeutic aspiration and repeat pressure dressing on Monday if the hematoma is not beginning to reabsorb and shrink and firm up.  Consult dermatology at [facility] given general pruritus.  06/11/18 There is blood that has drained through the sides of the  bandage.  However there is no soaking blood.  Unna boot bandages removed from his forearm.  There continues to be a large fluctuant hematoma.  It is 6 cm x 6 cm.  There continues to be a necrotic eschar over the top of the hematoma.  However there is no evidence of cellulitis.  I used an 18-gauge needle and attempted to aspirate the blood from the hematoma so that I could more effectively apply pressure dressing.  I was unable to aspirate any blood despite introducing the needle directly into the hematoma.  I then repositioned 180 degrees around the hematoma and attempted the same aspiration from the other side.  I was unsuccessful in aspirating any blood suggesting this time it has now clotted and coagulated suggesting against any active bleeding.  Therefore I believe the large mass is likely a blood clot.  In fact no blood came through the aspiration site.  I believe stopping the Lovenox has allow coagulation and hemostasis.  Therefore the patient was placed back in bandaging.  At that time, my plan was: It appears that the hematoma has coagulated and is now turned into a large clot.  I am unable to aspirate any blood from the hematoma.  Therefore I placed the patient back in a bandage.  Silvadene is applied to the top of the skin where there is a necrotic eschar to avoid secondary infection.  This is still  dry and represents dry gangrene that will gradually flake away I suspect.  This is covered with a nonadherent gauze.  Is then wrapped in calamine impregnated gauze and wrapped with coban comfortably from the elbow to the wrist.  Recheck the wound on Thursday.  I believe the patient can resume his Lovenox tomorrow as there is no evidence of active bleeding given the failure to aspirate any blood from the hematoma.  Hematoma should gradually reabsorb over a period of weeks to months.  06/14/18 Patient is here today for a wound check.  There is still a large  6cm  by 6 cm very superficial hematoma on the dorsal  surface of his right forearm.  There is a black necrotic eschar of dead skin directly over top of the hematoma.  However there is no visible bleeding.  There is no evidence of cellulitis.  The skin is already starting to breakdown over the right lateral aspect of the hematoma.  Underneath is a dark purple-black clot that is visibly opposed.  Patient has been back on Lovenox now for 48 hours with no enlargement of the hematoma no pain no extension of the bleeding.  At that time, my plan was: At this point, I am going to have home health wound care come out and start 2-3 times a week dressing changes.  I wonder if the patient would benefit from enzymatic debridement of the necrotic eschar and thick hematoma perhaps with a collagenase.  I will defer this question to the home wound care specialist.  Meanwhile I will cover the wound with Silvadene, nonadherent gauze, and wrapped the wound in the right forearm with Kerlix.  This was then covered with an Ace bandage to avoid secondary infection.  I would like to see the patient back on a weekly basis to assess for healing while wound care is visiting his home and providing dressing changes  06/18/18 As anticipated, the necrotic eschar over top of the coagulated hematoma ruptured and sloughed off.  This occurred over the weekend.  This leaves residual clotted purple hematoma and open ulcer this is roughly 6 cm in diameter.  Evidence of secondary cellulitis.  Using a pair of scissors, I bluntly removed the evulsed necrotic thick dead skin.  This flap of skin was approximately 4 cm in diameter.  Under this, is roughly 6 cm in diameter of thick clotted blood perhaps 1/4 inch deep.  At that time, my plan was: Silvadene was applied to the 6 cm open wound on the dorsal surface of his right forearm.  This was covered with nonadherent gauze, 4 x 4 gauze to provide pressure, and then wrapped in an Unna boot fashion loosely with calamine impregnated gauze and then coban.   Dressing resembles a short arm cast.  I have recommended replacing this dressing on Thursday.  I believe the patient may require additional debridement of the hematoma to avoid secondary infection and dressing changes.  I believe this is outside of the purview of [facility] home health wound care nurse.  Therefore I recommended a wound clinic.  I will arrange this consultation as soon as possible.  Recheck the patient on Thursday  06/21/18 Bandages removed today revealing a large 6 cm thick mound of clotted blood inside an ulcer.  Originally this was a subcutaneous hematoma however the skin on the surface of the hematoma became necrotic and ruptured revealing the underlying clotted blood at his last office visit.  Today, 90% of the clot simply fell out of  the wound with gentle traction without any residual bleeding.  This revealed a 6 cm ulcer down to the underlying subcutaneous fascia.  There is residual portion of blood clot on the radial aspect of the ulcer that is roughly 1.5 cm in diameter.  There is no evidence of secondary cellulitis.  There is no purulent drainage.  Patient has an appointment tomorrow at the wound clinic at [facility]  Past Medical History:  Diagnosis Date  . Adenocarcinoma of right lung ([place]) 01/06/2016  . Anginal chest pain at rest [facility])    Chronic non, controlled on Lorazepam  . Anxiety   . Arthritis    "all over"   . BPH (benign prostatic hyperplasia)   . CAD (coronary artery disease)   . Chronic lower back pain   . Complication of anesthesia    "problems making water afterwards"  . Depression   . DJD (degenerative joint disease)   . Esophageal ulcer without bleeding    bid ppi indefinitely  . Family history of adverse reaction to anesthesia    "children w/PONV"  . GERD (gastroesophageal reflux disease)   . Headache    "couple/week maybe" (09/04/2015)  . Hemochromatosis    Possible, elevated iron stores, hook-like osteophytes on hand films, normal LFTs  .  Hepatitis    "yellow jaundice as a baby"  . History of ASCVD    MULTIVESSEL  . Hyperlipidemia   . Hypertension   . Myocardial infarction ([place]) 1999  . Osteoarthritis   . Pneumonia    "several times; got a little now" (09/04/2015)  . Rheumatoid arthritis ([place])   . Subdural hematoma ([facility])    small after fall 09/2016-plavix held, neurosurgery consulted.  Asymptomatic.    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 ARM SKIN LESION BIOPSY / EXCISION Left 09/02/2015  . CATARACT EXTRACTION W/ INTRAOCULAR LENS  IMPLANT, BILATERAL Bilateral   . CHOLECYSTECTOMY OPEN    . CORNEAL TRANSPLANT Bilateral    "one at [facility]; one at [facility]"  . CORONARY ANGIOPLASTY WITH STENT PLACEMENT    . CORONARY ARTERY BYPASS GRAFT  1999  . DESCENDING AORTIC ANEURYSM REPAIR W/ STENT     DE stent ostium into the right radial free graft at OM-, 02-2007  . ESOPHAGOGASTRODUODENOSCOPY N/A 11/09/2014   Procedure: ESOPHAGOGASTRODUODENOSCOPY (EGD);  Surgeon: [name], MD;  Location: [facility] ENDOSCOPY;  Service: Endoscopy;  Laterality: N/A;  . INGUINAL HERNIA REPAIR    . JOINT REPLACEMENT    . LEFT HEART CATH AND CORS/GRAFTS ANGIOGRAPHY N/A 06/27/2017   Procedure: LEFT HEART CATH AND CORS/GRAFTS ANGIOGRAPHY;  Surgeon: [name], MD;  Location: [facility];  Service: Cardiovascular;  Laterality: N/A;  . NASAL SINUS SURGERY    . SHOULDER OPEN ROTATOR CUFF REPAIR Right   . TOTAL KNEE ARTHROPLASTY Bilateral    Current Outpatient Medications on File Prior to Visit  Medication Sig Dispense Refill  . acetaminophen (TYLENOL) 500 MG tablet Take 1,000 mg by mouth every 6 (six) hours as needed for mild pain.    . camphor-menthol (SARNA) lotion Apply topically as needed for itching. 222 mL 0  . cephALEXin (KEFLEX) 500 MG capsule Take 1 capsule (500 mg total) by mouth 4 (four) times daily. 28 capsule 0  . Cholecalciferol 2000 units TABS  Take 1 tablet (2,000 Units total) by mouth daily. 30 each 11  . enoxaparin (LOVENOX) 80 MG/0.8ML injection  Inject 0.8 mLs (80 mg total) into the skin every 12 (twelve) hours.    . ENSURE (ENSURE) Take 237 mLs by mouth 2 (two) times daily between meals.    Marland Kitchen FLUoxetine (PROZAC) 20 MG tablet Take 20 mg by mouth daily.    . furosemide (LASIX) 20 MG tablet Take 1 tablet (20 mg total) once a day. Take an extra tablet (20 mg total) for increased swelling, weight gain, or shortness of breath. 180 tablet 3  . hydrOXYzine (ATARAX/VISTARIL) 25 MG tablet Take 1 tablet (25 mg total) by mouth every 8 (eight) hours as needed for itching. 20 tablet 0  . Lifitegrast (XIIDRA) 5 % SOLN Place 1-2 drops into both eyes daily.     Marland Kitchen LORazepam (ATIVAN) 1 MG tablet Taking 1mg  in the AM, 0.5mg  (1/2 tablet) at lunch and 1/2 tablet (0.5mg ) at bedtime. 90 tablet 0  . magnesium oxide (MAG-OX) 400 (241.3 Mg) MG tablet Take 1 tablet (400 mg total) by mouth 2 (two) times daily. 180 tablet 3  . metoprolol succinate (TOPROL-XL) 25 MG 24 hr tablet TAKE ONE (1) TABLET BY MOUTH EVERY DAY 90 tablet 3  . mupirocin ointment (BACTROBAN) 2 % Apply 1 application topically 2 (two) times daily.    . nitroGLYCERIN (NITROSTAT) 0.4 MG SL tablet Place 1 tablet (0.4 mg total) under the tongue every 5 (five) minutes as needed for chest pain. 25 tablet 2  . pantoprazole (PROTONIX) 40 MG tablet Take one (1) tablet (40 mg) by mouth daily. 90 tablet 3  . predniSONE (DELTASONE) 5 MG tablet Label  & dispense according to the schedule below. take 8 Pills PO for 3 days, 6 Pills PO for 3 days, 4 Pills PO for 3 days, 2 Pills PO for 3 days, 1 Pills PO for 3 days, 1/2 Pill  PO for 3 days then STOP. Total 65 pills. 65 tablet 0  . rosuvastatin (CRESTOR) 5 MG tablet TAKE ONE (1) TABLET BY MOUTH EVERY DAY 90 tablet 3  . tamsulosin (FLOMAX) 0.4 MG CAPS capsule TAKE 1 CAPSULE EVERY DAY 90 capsule 3   No current facility-administered medications on file prior to  visit.    Allergies  Allergen Reactions  . Feldene [Piroxicam] Other (See Comments)    REACTION: Blisters  . Statins Other (See Comments)    Myalgias  . Doxycycline Other (See Comments)    REACTION:  unknown  . Latex Rash  . Tequin Anxiety  . Valium Other (See Comments)    REACTION:  unknown   Social History   Socioeconomic History  . Marital status: Widowed    Spouse name: Not on file  . Number of children: Not on file  . Years of education: Not on file  . Highest education level: Not on file  Occupational History  . Not on file  Social Needs  . Financial resource strain: Not on file  . Food insecurity:    Worry: Not on file    Inability: Not on file  . Transportation needs:    Medical: Not on file    Non-medical: Not on file  Tobacco Use  . Smoking status: Never Smoker  . Smokeless tobacco: Former Systems developer    Types: Chew  . Tobacco comment: "quit chewing in the 1960s"  Substance and Sexual Activity  . Alcohol use: No  . Drug use: No  . Sexual activity: Never  Lifestyle  . Physical activity:    Days per week: Not on file    Minutes per  session: Not on file  . Stress: Not on file  Relationships  . Social connections:    Talks on phone: Not on file    Gets together: Not on file    Attends religious service: Not on file    Active member of club or organization: Not on file    Attends meetings of clubs or organizations: Not on file    Relationship status: Not on file  . Intimate partner violence:    Fear of current or ex partner: Not on file    Emotionally abused: Not on file    Physically abused: Not on file    Forced sexual activity: Not on file  Other Topics Concern  . Not on file  Social History Narrative   01-05-18 Unable to ask abuse questions family with him today.   11-012-19 Unable to ask abuse questions family with him today.      Review of Systems  All other systems reviewed and are negative.      Objective:   Physical Exam Vitals signs  reviewed.  Constitutional:      General: He is not in acute distress.    Appearance: He is well-developed. He is not diaphoretic.  HENT:     Right Ear: External ear normal.     Left Ear: External ear normal.     Nose: Nose normal.     Mouth/Throat:     Pharynx: No oropharyngeal exudate.  Eyes:     Conjunctiva/sclera: Conjunctivae normal.  Neck:     Musculoskeletal: Neck supple.     Vascular: No JVD.  Cardiovascular:     Rate and Rhythm: Normal rate and regular rhythm.     Heart sounds: Normal heart sounds. No murmur.  Pulmonary:     Effort: Pulmonary effort is normal. No respiratory distress.     Breath sounds: No wheezing or rales.  Chest:     Chest wall: No tenderness.  Abdominal:     General: Bowel sounds are normal. There is no distension.     Palpations: Abdomen is soft.     Tenderness: There is no abdominal tenderness. There is no guarding or rebound.  Lymphadenopathy:     Cervical: No cervical adenopathy.    6 cm open wound on the surface of the right forearm down to the subcutaneous fascia.  Thick clotted blood is removed from the ulcer with simple traction.  More than 90% of the clot is removed with simple traction without any residual bleeding.  1 cm round residual clot remains on the radial aspect of the wound.  No evidence of cellulitis      Assessment & Plan:  Traumatic hematoma  Skin ulcer of upper arm, with fat layer exposed ([place])  Dried necrotic blood is bluntly removed from the ulcer with simple traction.  This leaves a large 6 to 7 cm open ulcer down to the underlying subcutaneous fat and fascia exposed on the dorsal surface of his right forearm.  I am concerned about secondary infection as this slowly heals.  Patient has an appointment at the wound clinic tomorrow.  I have asked the patient to start his Keflex 500 mg p.o. 3 times daily for 7 days as originally prescribed as he has had recurrent cellulitis of the forearm requiring hospitalization.  I believe  this open wound is high risk for secondary infection.  I dressed the wound by covering the ulcer with a large amount of Silvadene, it was then covered with nonadherent gauze and  wrapped in a pressure dressing with Coban.

## 2018-06-22 ENCOUNTER — Encounter: Payer: Medicare HMO | Attending: Physician Assistant | Admitting: Physician Assistant

## 2018-06-22 ENCOUNTER — Other Ambulatory Visit
Admission: RE | Admit: 2018-06-22 | Discharge: 2018-06-22 | Disposition: A | Discharge status: Home / Self Care | Payer: Medicare HMO | Source: Ambulatory Visit | Attending: *Deleted | Admitting: *Deleted

## 2018-06-22 DIAGNOSIS — Z85118 Personal history of other malignant neoplasm of bronchus and lung: Secondary | ICD-10-CM | POA: Insufficient documentation

## 2018-06-22 DIAGNOSIS — S51801A Unspecified open wound of right forearm, initial encounter: Secondary | ICD-10-CM | POA: Diagnosis not present

## 2018-06-22 DIAGNOSIS — I5022 Chronic systolic (congestive) heart failure: Secondary | ICD-10-CM | POA: Diagnosis not present

## 2018-06-22 DIAGNOSIS — Z8349 Family history of other endocrine, nutritional and metabolic diseases: Secondary | ICD-10-CM | POA: Insufficient documentation

## 2018-06-22 DIAGNOSIS — E11622 Type 2 diabetes mellitus with other skin ulcer: Secondary | ICD-10-CM | POA: Diagnosis not present

## 2018-06-22 DIAGNOSIS — X58XXXA Exposure to other specified factors, initial encounter: Secondary | ICD-10-CM | POA: Insufficient documentation

## 2018-06-22 DIAGNOSIS — S51811A Laceration without foreign body of right forearm, initial encounter: Secondary | ICD-10-CM | POA: Diagnosis not present

## 2018-06-22 DIAGNOSIS — L299 Pruritus, unspecified: Secondary | ICD-10-CM | POA: Insufficient documentation

## 2018-06-22 DIAGNOSIS — Z833 Family history of diabetes mellitus: Secondary | ICD-10-CM | POA: Insufficient documentation

## 2018-06-22 DIAGNOSIS — M069 Rheumatoid arthritis, unspecified: Secondary | ICD-10-CM | POA: Diagnosis not present

## 2018-06-22 DIAGNOSIS — I110 Hypertensive heart disease with heart failure: Secondary | ICD-10-CM | POA: Insufficient documentation

## 2018-06-22 DIAGNOSIS — Z7901 Long term (current) use of anticoagulants: Secondary | ICD-10-CM | POA: Diagnosis not present

## 2018-06-22 DIAGNOSIS — Z8249 Family history of ischemic heart disease and other diseases of the circulatory system: Secondary | ICD-10-CM | POA: Diagnosis not present

## 2018-06-22 DIAGNOSIS — Z881 Allergy status to other antibiotic agents status: Secondary | ICD-10-CM | POA: Insufficient documentation

## 2018-06-22 DIAGNOSIS — S51809A Unspecified open wound of unspecified forearm, initial encounter: Secondary | ICD-10-CM | POA: Diagnosis not present

## 2018-06-22 DIAGNOSIS — L98492 Non-pressure 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 fat layer exposed: Secondary | ICD-10-CM | POA: Insufficient documentation

## 2018-06-22 DIAGNOSIS — Z888 Allergy status to other drugs, medicaments and biological substances status: Secondary | ICD-10-CM | POA: Diagnosis not present

## 2018-06-22 DIAGNOSIS — L988 Other specified disorders of the skin and subcutaneous tissue: Secondary | ICD-10-CM | POA: Diagnosis not present

## 2018-06-22 NOTE — Progress Notes (Signed)
Evan [name], Evan [name] ([id number]) Visit Report for 06/22/2018 Abuse/Suicide Risk Screen Details Patient Name: Evan [name], Evan [name] Date of Service: 06/22/2018 8:45 AM Medical Record Number: [id number] Patient Account Number: [id number] Date of Birth/Sex: [birth year] (85 y.o. M) Treating RN: Army Melia Primary Care Ernestine Langworthy: [name] Other Clinician: Referring Rashauna Tep: [name] Treating Yanique Mulvihill/Extender: Melburn Hake, HOYT Weeks in Treatment: 0 Abuse/Suicide Risk Screen Items Answer ABUSE/SUICIDE RISK SCREEN: Has anyone close to you tried to hurt or harm you recentlyo No Do you feel uncomfortable with anyone in your familyo No Has anyone forced you do things that you didnot want to doo No Do you have any thoughts of harming yourselfo No Patient displays signs or symptoms of abuse and/or neglect. No Electronic Signature(s) Signed: 06/22/2018 11:22:54 AM By: Army Melia Entered By: Army Melia on 06/22/2018 09:08:20 Evan [name] ([id number]) -------------------------------------------------------------------------------- Activities of Daily Living Details Patient Name: Evan [name] Date of Service: 06/22/2018 8:45 AM Medical Record Number: [id number] Patient Account Number: [id number] Date of Birth/Sex: July 26, 1927 (85 y.o. M) Treating RN: Army Melia Primary Care Carsin Randazzo: [name] Other Clinician: Referring Verneal Wiers: [name] Treating Esme Durkin/Extender: Melburn Hake, HOYT Weeks in Treatment: 0 Activities of Daily Living Items Answer Activities of Daily Living (Please select one for each item) Drive Automobile Completely Able Take Medications Completely Able Use Telephone Completely Able Care for Appearance Completely Able Use Toilet Completely Able Bath / Shower Completely Able Dress Self Completely Able Feed Self Completely Able Walk Completely Able Get In / Out Bed Need Assistance Housework Need Assistance Prepare Meals Completely [place] for Self Completely Able Electronic Signature(s) Signed: 06/22/2018 11:22:54 AM By: Army Melia Entered By: Army Melia on 06/22/2018 09:08:55 Evan [name] ([id number]) -------------------------------------------------------------------------------- Education Assessment Details Patient Name: Evan [name] Date of Service: 06/22/2018 8:45 AM Medical Record Number: [id number] Patient Account Number: [id number] Date of Birth/Sex: 03/16/1928 (85 y.o. M) Treating RN: Army Melia Primary Care Zenia Guest: [name] Other Clinician: Referring Cerena Baine: [name] Treating Mena Lienau/Extender: Melburn Hake, HOYT Weeks in Treatment: 0 Primary Learner Assessed: Patient Learning Preferences/Education Level/Primary Language Learning Preference: Explanation, Demonstration Highest Education Level: Grade School Preferred Language: English Cognitive Barrier Assessment/Beliefs Language Barrier: No Translator Needed: No Memory Deficit: No Emotional Barrier: No Cultural/Religious Beliefs Affecting Medical Care: No Physical Barrier Assessment Impaired Vision: No Impaired Hearing: No Decreased Hand dexterity: No Knowledge/Comprehension Assessment Knowledge Level: High Comprehension Level: High Ability to understand written High instructions: Ability to understand verbal High instructions: Motivation Assessment Anxiety Level: Calm Cooperation: Cooperative Education Importance: Acknowledges Need Interest in Health Problems: Asks Questions Perception: Coherent Willingness to Engage in Self- High Management Activities: Readiness to Engage in Self- High Management Activities: Electronic Signature(s) Signed: 06/22/2018 11:22:54 AM By: Army Melia Entered By: Army Melia on 06/22/2018 09:10:10 Evan [name], Evan [name] ([id number]) -------------------------------------------------------------------------------- Fall Risk Assessment Details Patient Name: Evan [name] Date of Service: 06/22/2018 8:45 AM Medical Record Number: [id number] Patient Account Number: [id number] Date of Birth/Sex: Oct 26, 1927 (85 y.o. M) Treating RN: Army Melia Primary Care Jusiah Aguayo: [name] Other Clinician: Referring Hilde Churchman: [name] Treating Genisis Sonnier/Extender: Melburn Hake, HOYT Weeks in Treatment: 0 Fall Risk Assessment Items Have you had 2 or more falls in the last 12 monthso 0 No Have you had any fall that resulted in injury in the last 12 monthso 0 No FALL RISK ASSESSMENT: History of falling - immediate or within 3 months 0 No Secondary diagnosis 0 No Ambulatory aid None/bed rest/wheelchair/nurse 0 No Crutches/cane/walker 0 No Furniture 0  No IV Access/Saline Lock 0 No Gait/Training Normal/bed rest/immobile 0 No Weak 0 No Impaired 0 No Mental Status Oriented to own ability 0 No Electronic Signature(s) Signed: 06/22/2018 11:22:54 AM By: Army Melia Entered By: Army Melia on 06/22/2018 09:10:29 Evan [name] ([id number]) -------------------------------------------------------------------------------- Foot Assessment Details Patient Name: Evan [name] Date of Service: 06/22/2018 8:45 AM Medical Record Number: [id number] Patient Account Number: [id number] Date of Birth/Sex: July 16, 1927 (85 y.o. M) Treating RN: Army Melia Primary Care Dakai Braithwaite: [name] Other Clinician: Referring Vianny Schraeder: [name] Treating Shamyia Grandpre/Extender: Melburn Hake, HOYT Weeks in Treatment: 0 Foot Assessment Items Site Locations + = Sensation present, - = Sensation absent, C = Callus, U = Ulcer R = Redness, W = Warmth, M = Maceration, PU = Pre-ulcerative lesion F = Fissure, S = Swelling, D = Dryness Assessment Right: Left: Other Deformity: No No Prior Foot Ulcer: No No Prior Amputation: No No Charcot Joint: No No Ambulatory Status: Gait: Notes Pt is non-diabetic and here for upper extremity wound Electronic Signature(s) Signed: 06/22/2018 11:22:54  AM By: Army Melia Entered By: Army Melia on 06/22/2018 09:11:06 Evan [name] ([id number]) -------------------------------------------------------------------------------- Nutrition Risk Assessment Details Patient Name: Evan [name] Date of Service: 06/22/2018 8:45 AM Medical Record Number: [id number] Patient Account Number: [id number] Date of Birth/Sex: 05-23-1927 (85 y.o. M) Treating RN: Army Melia Primary Care Rosia Syme: [name] Other Clinician: Referring Roizy Harold: [name] Treating Harriet Sutphen/Extender: Melburn Hake, HOYT Weeks in Treatment: 0 Height (in): 65 Weight (lbs): 175 Body Mass Index (BMI): 29.1 Nutrition Risk Assessment Items NUTRITION RISK SCREEN: I have an illness or condition that made me change the kind and/or amount of 0 No food I eat I eat fewer than two meals per day 0 No I eat few fruits and vegetables, or milk products 0 No I have three or more drinks of beer, liquor or wine almost every day 0 No I have tooth or mouth problems that make it hard for me to eat 0 No I don't always have enough money to buy the food I need 0 No I eat alone most of the time 0 No I take three or more different prescribed or over-the-counter drugs a day 0 No Without wanting to, I have lost or gained 10 pounds in the last six months 0 No I am not always physically able to shop, cook and/or feed myself 0 No Nutrition Protocols Good Risk Protocol Moderate Risk Protocol Electronic Signature(s) Signed: 06/22/2018 11:22:54 AM By: Army Melia Entered By: Army Melia on 06/22/2018 09:10:38

## 2018-06-23 NOTE — Progress Notes (Signed)
Evan [name], Evan [name] ([id number]) Visit Report for 06/22/2018 Biopsy Details Patient Name: Evan [name] Date of Service: 06/22/2018 8:45 AM Medical Record Number: [id number] Patient Account Number: [id number] Date of Birth/Sex: 06/05/1927 (85 y.o. M) Treating RN: Montey Hora Primary Care Provider: [name] Other Clinician: Referring Provider: [name] Treating Provider/Extender: STONE III, Jhanvi Drakeford Weeks in Treatment: 0 Biopsy Performed for: Wound #1 Right Forearm Location(s): Wound Margin Performed By: Physician STONE III, Chalmer Zheng E., PA-C Tissue Punch: No Number of Specimens Taken: 1 Specimen Sent To Pathology: Yes Level of Consciousness (Pre-procedure): Awake and Alert Pre-procedure Verification/Time-Out Taken: Yes - 09:40 Pain Control: Lidocaine Injectable Lidocaine Percent: 2% Instrument: Blade, Forceps Hemostasis Achieved: Pressure Procedural Pain: 0 Post Procedural Pain: 0 Response to Treatment: Procedure was tolerated well Level of Consciousness (Post-procedure): Awake and Alert Post Procedure Diagnosis Same as Pre-procedure Notes 63ml injectible lidocaine Electronic Signature(s) Signed: 06/22/2018 11:59:00 AM By: Worthy Keeler PA-C Signed: 06/22/2018 3:50:35 PM By: Montey Hora Entered By: Montey Hora on 06/22/2018 09:43:11 Evan [name] ([id number]) -------------------------------------------------------------------------------- Chief Complaint Document Details Patient Name: Evan [name] Date of Service: 06/22/2018 8:45 AM Medical Record Number: [id number] Patient Account Number: [id number] Date of Birth/Sex: 05/22/1927 (85 y.o. M) Treating RN: Montey Hora Primary Care Provider: [name] Other Clinician: Referring Provider: [name] Treating Provider/Extender: Melburn Hake, Kathrene Sinopoli Weeks in Treatment: 0 Information Obtained from: Patient Chief Complaint Right forearm skin tear Electronic Signature(s) Signed: 06/22/2018 11:59:00 AM By: Worthy Keeler PA-C Entered By: Worthy Keeler on 06/22/2018 09:23:43 Evan [name] ([id number]) -------------------------------------------------------------------------------- Debridement Details Patient Name: Evan [name] Date of Service: 06/22/2018 8:45 AM Medical Record Number: [id number] Patient Account Number: [id number] Date of Birth/Sex: 05/06/1927 (85 y.o. M) Treating RN: Montey Hora Primary Care Provider: [name] Other Clinician: Referring Provider: [name] Treating Provider/Extender: Melburn Hake, Jenafer Winterton Weeks in Treatment: 0 Debridement Performed for Wound #1 Right Forearm Assessment: Performed By: Physician STONE III, Tristian Bouska E., PA-C Debridement Type: Chemical/Enzymatic/Mechanical Agent Used: Santyl Level of Consciousness (Pre- Awake and Alert procedure): Pre-procedure Verification/Time Yes - 09:46 Out Taken: Start Time: 09:46 Pain Control: Lidocaine 4% Topical Solution Instrument: Other : tongue blade Bleeding: None End Time: 09:47 Procedural Pain: 0 Post Procedural Pain: 0 Response to Treatment: Procedure was tolerated well Level of Consciousness Awake and Alert (Post-procedure): Post Debridement Measurements of Total Wound Length: (cm) 5 Width: (cm) 3.5 Depth: (cm) 0.6 Volume: (cm) 8.247 Character of Wound/Ulcer Post Debridement: Improved Post Procedure Diagnosis Same as Pre-procedure Electronic Signature(s) Signed: 06/22/2018 11:59:00 AM By: Worthy Keeler PA-C Signed: 06/22/2018 3:50:35 PM By: Montey Hora Entered By: Montey Hora on 06/22/2018 09:47:12 Evan [name] ([id number]) -------------------------------------------------------------------------------- HPI Details Patient Name: Evan [name] Date of Service: 06/22/2018 8:45 AM Medical Record Number: [id number] Patient Account Number: [id number] Date of Birth/Sex: 1927/09/29 (85 y.o. M) Treating RN: Montey Hora Primary Care Provider: [name] Other  Clinician: Referring Provider: [name] Treating Provider/Extender: Melburn Hake, Benoit Meech Weeks in Treatment: 0 History of Present Illness HPI Description: 06/22/18 on evaluation today patient presents for initial inspection here in our clinic concerning an ulcer on the right dorsal forearm. The exact timing of this is somewhat ambiguous although it appears to be possibly around one month from on set. I am told by the patient's daughter was present during the evaluation today with him that he had an area which was itching on the right forearm and subsequently scratched it because in an open wound that led to a hematoma that had to  be drained twice when he went to the ER. That was around 06/08/18. Subsequently that was not really effective and he ended up with a congealed hematoma under the area which has since opened. He has been seen by his primary care provider as well and placed on Keflex which he is currently taking. The patient is on Lovenox twice a day 80 mg. He apparently fell treatment with eloquence he still developed multiple blood clots even on the Eliquis. Therefore he does have issues with bleeding in general. With that being said the patient does have a history of hypertension congestive heart failure, long-term use of anticoagulants, and a personal history of lung cancer which is currently in remission. He has never had any skin cancer issues that they are aware of. He does have a consult which was placed by his primary care provider to the [facility] in [place] although he has not been for that as of yet in fact they do not even have an appointment time. Fortunately there's no evidence of active infection. No fevers, chills, nausea, or vomiting noted at this time. Electronic Signature(s) Signed: 06/22/2018 11:59:00 AM By: Worthy Keeler PA-C Entered By: Worthy Keeler on 06/22/2018 10:48:31 Evan [name]  ([id number]) -------------------------------------------------------------------------------- Physical Exam Details Patient Name: Evan [name] Date of Service: 06/22/2018 8:45 AM Medical Record Number: [id number] Patient Account Number: [id number] Date of Birth/Sex: [birth year] (85 y.o. M) Treating RN: Montey Hora Primary Care Provider: [name] Other Clinician: Referring Provider: [name] Treating Provider/Extender: STONE III, Niveah Boerner Weeks in Treatment: 0 Constitutional sitting or standing blood pressure is within target range for patient.. pulse regular and within target range for patient.Marland Kitchen respirations regular, non-labored and within target range for patient.Marland Kitchen temperature within target range for patient.. Well- nourished and well-hydrated in no acute distress. Eyes conjunctiva clear no eyelid edema noted. pupils equal round and reactive to light and accommodation. Ears, Nose, Mouth, and Throat no gross abnormality of ear auricles or external auditory canals. normal hearing noted during conversation. mucus membranes moist. Respiratory normal breathing without difficulty. clear to auscultation bilaterally. Cardiovascular regular rate and rhythm with normal S1, S2. no clubbing, cyanosis, significant edema, <3 sec cap refill. Gastrointestinal (GI) soft, non-tender, non-distended, +BS. no ventral hernia noted. Musculoskeletal Patient unable to walk without assistance of a cane. no significant deformity or arthritic changes, no loss or range of motion, no clubbing. Psychiatric Patient is not able to cooperate in decision making regarding care. Patient is oriented to person only. pleasant and cooperative. Notes Upon inspection of the wound there does appear to be congealed evidence of hematoma noted especially on the lateral portion of the wound. Nonetheless there is some Slough noted in the base of the wound as well and currently I would not want to proceed with any  further debridement of this area due to the amount of bleeding that he's previously experienced at the site. Nonetheless as I expected the wound further I became somewhat concerned about the fact that the edge of the wound has a thickened and somewhat unusual appearance. This is something that I've often seen in wounds that are secondary to a skin cancer process. This is not just the normal Epiboly. Nonetheless I did suggest for the patient and his daughter but I think a biopsy of a small piece would be beneficial for further delineation of exactly what is going on. There in agreement with this plan. Therefore I did none the area with 1 mL of 2% lidocaine and then  using a scalpel removed a small piece of tissue on the margin of the wound to send for further evaluation. He did bleed some we were able to achieve hemostasis with pressure. I did not perform a deep punch biopsy due again to my concerns about bleeding. Electronic Signature(s) Signed: 06/22/2018 11:59:00 AM By: Worthy Keeler PA-C Entered By: Worthy Keeler on 06/22/2018 10:50:51 Evan [name] ([id number]) -------------------------------------------------------------------------------- Physician Orders Details Patient Name: Evan [name] Date of Service: 06/22/2018 8:45 AM Medical Record Number: [id number] Patient Account Number: [id number] Date of Birth/Sex: 06/19/1927 (85 y.o. M) Treating RN: Montey Hora Primary Care Provider: [name] Other Clinician: Referring Provider: [name] Treating Provider/Extender: Melburn Hake, Earma Nicolaou Weeks in Treatment: 0 Verbal / Phone Orders: No Diagnosis Coding ICD-10 Coding Code Description S51.801A Unspecified open wound of right forearm, initial encounter L98.492 Non-pressure 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 fat layer exposed I10 Essential (primary) hypertension Z79.01 Long term (current) use of anticoagulants K02.54 Chronic systolic (congestive) heart failure Z85.118  Personal history of other malignant neoplasm of bronchus and lung Wound Cleansing Wound #1 Right Forearm o Clean wound with Normal Saline. o May Shower, gently pat wound dry prior to applying new dressing. Anesthetic (add to Medication List) Wound #1 Right Forearm o Topical Lidocaine 4% cream applied to wound bed prior to debridement (In Clinic Only). o Injected 2% Xylocaine MPF prior to debridement (In Clinic Only). Primary Wound Dressing Wound #1 Right Forearm o Santyl Ointment Secondary Dressing Wound #1 Right Forearm o Gauze, ABD and Kerlix/Conform - secure lightly with coban Dressing Change Frequency Wound #1 Right Forearm o Change dressing every day. Follow-up Appointments Wound #1 Right Forearm o Return Appointment in 1 week. Medications-please add to medication list. Wound #1 Right Forearm o Santyl Enzymatic Ointment Laboratory Evan [name], Evan [name] ([id number]) o Tissue Pathology biopsy report (PATH) oooo LOINC Code: 83151-7 OHYW Convenience Name: Tiss Path Bx report Patient Medications Allergies: Feldene, statins, doxycycline, latex, Tequin, Valium Notifications Medication Indication Start End Santyl 06/22/2018 DOSE topical 250 unit/gram ointment - ointment topical applied nickel thick to the wound bed and then cover with a dressing as directed Electronic Signature(s) Signed: 06/22/2018 10:52:30 AM By: Worthy Keeler PA-C Entered By: Worthy Keeler on 06/22/2018 10:52:29 Evan [name] ([id number]) -------------------------------------------------------------------------------- Problem List Details Patient Name: Evan [name] Date of Service: 06/22/2018 8:45 AM Medical Record Number: [id number] Patient Account Number: [id number] Date of Birth/Sex: 02-22-1928 (85 y.o. M) Treating RN: Montey Hora Primary Care Provider: [name] Other Clinician: Referring Provider: [name] Treating Provider/Extender: Melburn Hake, Chizuko Trine Weeks in  Treatment: 0 Active Problems ICD-10 Evaluated Encounter Code Description Active Date Today Diagnosis S51.801A Unspecified open wound of right forearm, initial encounter 06/22/2018 No Yes L98.492 Non-pressure 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 fat layer 06/22/2018 No Yes exposed [place] (primary) hypertension 06/22/2018 No Yes Z79.01 Long term (current) use of anticoagulants 06/22/2018 No Yes J00.93 Chronic systolic (congestive) heart failure 06/22/2018 No Yes Z85.118 Personal history of other malignant neoplasm of bronchus 06/22/2018 No Yes and lung Inactive Problems Resolved Problems Electronic Signature(s) Signed: 06/22/2018 11:59:00 AM By: Worthy Keeler PA-C Entered By: Worthy Keeler on 06/22/2018 09:23:28 Evan [name] ([id number]) -------------------------------------------------------------------------------- Progress Note Details Patient Name: Evan [name] Date of Service: 06/22/2018 8:45 AM Medical Record Number: [id number] Patient Account Number: [id number] Date of Birth/Sex: 06/07/1927 (85 y.o. M) Treating RN: Montey Hora Primary Care Provider: [name] Other Clinician: Referring Provider: [name] Treating Provider/Extender:  STONE III, Shaquinta Peruski Weeks in Treatment: 0 Subjective Chief Complaint Information obtained from Patient Right forearm skin tear History of Present Illness (HPI) 06/22/18 on evaluation today patient presents for initial inspection here in our clinic concerning an ulcer on the right dorsal forearm. The exact timing of this is somewhat ambiguous although it appears to be possibly around one month from on set. I am told by the patient's daughter was present during the evaluation today with him that he had an area which was itching on the right forearm and subsequently scratched it because in an open wound that led to a hematoma that had to be drained twice when he went to the ER. That was around 06/08/18. Subsequently that was not  really effective and he ended up with a congealed hematoma under the area which has since opened. He has been seen by his primary care provider as well and placed on Keflex which he is currently taking. The patient is on Lovenox twice a day 80 mg. He apparently fell treatment with eloquence he still developed multiple blood clots even on the Eliquis. Therefore he does have issues with bleeding in general. With that being said the patient does have a history of hypertension congestive heart failure, long-term use of anticoagulants, and a personal history of lung cancer which is currently in remission. He has never had any skin cancer issues that they are aware of. He does have a consult which was placed by his primary care provider to the [facility] in [place] although he has not been for that as of yet in fact they do not even have an appointment time. Fortunately there's no evidence of active infection. No fevers, chills, nausea, or vomiting noted at this time. Wound History Patient presents with 1 open wound that has been present for approximately 3 weeks. Patient has been treating wound in the following manner: pressure dressing. Laboratory tests have been performed in the last month. Patient reportedly has not tested positive for an antibiotic resistant organism. Patient reportedly has not tested positive for osteomyelitis. Patient reportedly has not had testing performed to evaluate circulation in the legs. Patient History Information obtained from Caregiver. Allergies Feldene (Severity: Moderate, Reaction: blisters), statins (Severity: Moderate, Reaction: myalgias), doxycycline (Severity: Moderate, Reaction: unknown), latex (Severity: Moderate, Reaction: rash), Tequin (Severity: Moderate, Reaction: anxiety), Valium (Severity: Moderate, Reaction: unknown) Family History Diabetes - Siblings, Heart Disease - Mother,Siblings, Hypertension - Child, Thyroid Problems - Child, No  family history of Cancer, Hereditary Spherocytosis, Kidney Disease, Lung Disease, Seizures, Stroke, Tuberculosis. Social History Never smoker, Marital Status - Widowed, Alcohol Use - Never, Drug Use - No History, Caffeine Use - Never. Medical History Eyes Patient has history of Cataracts Denies history of Glaucoma, Optic Neuritis Evan [name], Evan [name] ([id number]) Ear/Nose/Mouth/Throat Denies history of Chronic sinus problems/congestion, Middle ear problems Hematologic/Lymphatic Denies history of Anemia, Hemophilia, Human Immunodeficiency Virus, Lymphedema, Sickle Cell Disease Respiratory Denies history of Aspiration, Asthma, Chronic Obstructive Pulmonary Disease (COPD), Pneumothorax, Sleep Apnea, Tuberculosis Cardiovascular Patient has history of Congestive Heart Failure - 3 months, Hypertension Denies history of Angina, Arrhythmia, Coronary Artery Disease, Deep Vein Thrombosis, Hypotension, Myocardial Infarction, Peripheral Arterial Disease, Peripheral Venous Disease, Phlebitis, Vasculitis Gastrointestinal Denies history of Cirrhosis , Colitis, Crohn s, Hepatitis A, Hepatitis B, Hepatitis C Endocrine Denies history of Type I Diabetes, Type II Diabetes Genitourinary Denies history of End Stage Renal Disease Immunological Denies history of Lupus Erythematosus, Raynaud s, Scleroderma Integumentary (Skin) Denies history of History of Burn, History of pressure wounds Musculoskeletal  Patient has history of Rheumatoid Arthritis Denies history of Gout, Osteoarthritis, Osteomyelitis Neurologic Denies history of Dementia, Neuropathy, Quadriplegia, Paraplegia, Seizure Disorder Oncologic Patient has history of Received Radiation - lung cancer 2 years Denies history of Received Chemotherapy Psychiatric Denies history of Anorexia/bulimia, Confinement Anxiety Review of Systems (ROS) Constitutional Symptoms (General Health) The patient has no complaints or symptoms. Eyes Complains or has  symptoms of Dry Eyes, Glasses / Contacts - glasses. Ear/Nose/Mouth/Throat Denies complaints or symptoms of Difficult clearing ears, Sinusitis. Hematologic/Lymphatic Denies complaints or symptoms of Bleeding / Clotting Disorders, Human Immunodeficiency Virus. Respiratory Complains or has symptoms of Shortness of Breath. Denies complaints or symptoms of Chronic or frequent coughs. Cardiovascular Denies complaints or symptoms of Chest pain, LE edema. Gastrointestinal The patient has no complaints or symptoms. Endocrine The patient has no complaints or symptoms. Genitourinary The patient has no complaints or symptoms. Immunological The patient has no complaints or symptoms. Integumentary (Skin) Complains or has symptoms of Wounds - 1. Musculoskeletal Denies complaints or symptoms of Muscle Pain, Muscle Weakness. Neurologic Denies complaints or symptoms of Numbness/parasthesias, Focal/Weakness. Evan [name], Evan [name] ([id number]) Oncologic The patient has no complaints or symptoms. Psychiatric Complains or has symptoms of Anxiety. Objective Constitutional sitting or standing blood pressure is within target range for patient.. pulse regular and within target range for patient.Marland Kitchen respirations regular, non-labored and within target range for patient.Marland Kitchen temperature within target range for patient.. Well- nourished and well-hydrated in no acute distress. Vitals Time Taken: 8:48 AM, Height: 65 in, Source: Stated, Weight: 175 lbs, Source: Measured, BMI: 29.1, Temperature: 98.4 F, Pulse: 75 bpm, Respiratory Rate: 16 breaths/min, Blood Pressure: 118/65 mmHg. Eyes conjunctiva clear no eyelid edema noted. pupils equal round and reactive to light and accommodation. Ears, Nose, Mouth, and Throat no gross abnormality of ear auricles or external auditory canals. normal hearing noted during conversation. mucus membranes moist. Respiratory normal breathing without difficulty. clear to auscultation  bilaterally. Cardiovascular regular rate and rhythm with normal S1, S2. no clubbing, cyanosis, significant edema, Gastrointestinal (GI) soft, non-tender, non-distended, +BS. no ventral hernia noted. Musculoskeletal Patient unable to walk without assistance of a cane. no significant deformity or arthritic changes, no loss or range of motion, no clubbing. Psychiatric Patient is not able to cooperate in decision making regarding care. Patient is oriented to person only. pleasant and cooperative. General Notes: Upon inspection of the wound there does appear to be congealed evidence of hematoma noted especially on the lateral portion of the wound. Nonetheless there is some Slough noted in the base of the wound as well and currently I would not want to proceed with any further debridement of this area due to the amount of bleeding that he's previously experienced at the site. Nonetheless as I expected the wound further I became somewhat concerned about the fact that the edge of the wound has a thickened and somewhat unusual appearance. This is something that I've often seen in wounds that are secondary to a skin cancer process. This is not just the normal Epiboly. Nonetheless I did suggest for the patient and his daughter but I think a biopsy of a small piece would be beneficial for further delineation of exactly what is going on. There in agreement with this plan. Therefore I did none the area with 1 mL of 2% lidocaine and then using a scalpel removed a small piece of tissue on the margin of the wound to send for further evaluation. He did bleed some we were able to achieve hemostasis with pressure. I  did not perform a deep punch biopsy due again to my concerns about bleeding. Evan [name], Evan [name] ([id number]) Integumentary (Hair, Skin) Wound #1 status is Open. Original cause of wound was Trauma. The wound is located on the Right Forearm. The wound measures 5cm length x 3.5cm width x 0.6cm depth;  13.744cm^2 area and 8.247cm^3 volume. There is Fat Layer (Subcutaneous Tissue) Exposed exposed. There is no tunneling or undermining noted. There is a large amount of sanguinous drainage noted. The wound margin is flat and intact. There is small (1-33%) red granulation within the wound bed. There is a large (67-100%) amount of necrotic tissue within the wound bed including Eschar and Adherent Slough. The periwound skin appearance did not exhibit: Callus, Crepitus, Excoriation, Induration, Rash, Scarring, Dry/Scaly, Maceration, Atrophie Blanche, Cyanosis, Ecchymosis, Hemosiderin Staining, Mottled, Pallor, Rubor, Erythema. Assessment Active Problems ICD-10 Unspecified open wound of right forearm, initial encounter Non-pressure 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 fat layer exposed Essential (primary) hypertension Long term (current) use of anticoagulants Chronic systolic (congestive) heart failure Personal history of other malignant neoplasm of bronchus and lung Procedures Wound #1 Pre-procedure diagnosis of Wound #1 is a Trauma, Other located on the Right Forearm . There was a Chemical/Enzymatic/Mechanical debridement performed by STONE III, Laquana Villari E., PA-C. With the following instrument(s): tongue blade after achieving pain control using Lidocaine 4% Topical Solution. Agent used was [employer]. A time out was conducted at 09:46, prior to the start of the procedure. There was no bleeding. The procedure was tolerated well with a pain level of 0 throughout and a pain level of 0 following the procedure. Post Debridement Measurements: 5cm length x 3.5cm width x 0.6cm depth; 8.247cm^3 volume. Character of Wound/Ulcer Post Debridement is improved. Post procedure Diagnosis Wound #1: Same as Pre-Procedure Pre-procedure diagnosis of Wound #1 is a Trauma, Other located on the Right Forearm . There was a biopsy performed by STONE III, Graysyn Bache E., PA-C. There was a biopsy performed on Wound Margin. The skin  was cleansed and prepped with anti- septic followed by pain control using Lidocaine Injectable: 2%. Tissue was removed at its base with the following instrument (s): Blade and Forceps and sent to pathology. A time out was conducted at 09:40, prior to the start of the procedure. The procedure was tolerated well with a pain level of 0 throughout and a pain level of 0 following the procedure. Post procedure Diagnosis Wound #1: Same as Pre-Procedure General Notes: 7ml injectible lidocaine. Plan Wound Cleansing: Wound #1 Right Forearm: Evan [name], Evan [name] ([id number]) Clean wound with Normal Saline. May Shower, gently pat wound dry prior to applying new dressing. Anesthetic (add to Medication List): Wound #1 Right Forearm: Topical Lidocaine 4% cream applied to wound bed prior to debridement (In Clinic Only). Injected 2% Xylocaine MPF prior to debridement (In Clinic Only). Primary Wound Dressing: Wound #1 Right Forearm: Santyl Ointment Secondary Dressing: Wound #1 Right Forearm: Gauze, ABD and Kerlix/Conform - secure lightly with coban Dressing Change Frequency: Wound #1 Right Forearm: Change dressing every day. Follow-up Appointments: Wound #1 Right Forearm: Return Appointment in 1 week. Medications-please add to medication list.: Wound #1 Right Forearm: Santyl Enzymatic Ointment Laboratory ordered were: [facility] Bx report The following medication(s) was prescribed: Santyl topical 250 unit/gram ointment ointment topical applied nickel thick to the wound bed and then cover with a dressing as directed starting 06/22/2018 My suggestion currently is gonna be that we go ahead and initiate the above wound care measures with cental for the patient at  this point. I'll go ahead and send in medicine for two weeks and then we will subsequently see were things stand at that point. If anything changes or worsens the meantime will contact the office and let me know otherwise my hope is that this  will help to clean things up while we get a better idea as to whether or not this could be a skin cancer lesion. There are in agreement with this plan. Please see above for specific wound care orders. We will see patient for re-evaluation in 1 week(s) here in the clinic. If anything worsens or changes patient will contact our office for additional recommendations. Electronic Signature(s) Signed: 06/22/2018 11:59:00 AM By: Worthy Keeler PA-C Entered By: Worthy Keeler on 06/22/2018 10:52:45 Evan [name] ([id number]) -------------------------------------------------------------------------------- ROS/PFSH Details Patient Name: Evan [name] Date of Service: 06/22/2018 8:45 AM Medical Record Number: [id number] Patient Account Number: [id number] Date of Birth/Sex: 1927/05/04 (85 y.o. M) Treating RN: Army Melia Primary Care Provider: [name] Other Clinician: Referring Provider: [name] Treating Provider/Extender: Melburn Hake, Sanaii Caporaso Weeks in Treatment: 0 Information Obtained From Caregiver Wound History Do you currently have one or more open woundso Yes How many open wounds do you currently haveo 1 Approximately how long have you had your woundso 3 weeks How have you been treating your wound(s) until nowo pressure dressing Has your wound(s) ever healed and then re-openedo No Have you had any lab work done in the past montho Yes Who ordered the lab work doneo [place] Have you tested positive for an antibiotic resistant organism (MRSA, VRE)o No Have you tested positive for osteomyelitis (bone infection)o No Have you had any tests for circulation on your legso No Constitutional Symptoms (General Health) Complaints and Symptoms: No Complaints or Symptoms Complaints and Symptoms: Negative for: Fatigue; Fever; Chills; Marked Weight Change Eyes Complaints and Symptoms: Positive for: Dry Eyes; Glasses / Contacts - glasses Medical History: Positive for:  Cataracts Negative for: Glaucoma; Optic Neuritis Ear/Nose/Mouth/Throat Complaints and Symptoms: Negative for: Difficult clearing ears; Sinusitis Medical History: Negative for: Chronic sinus problems/congestion; Middle ear problems Hematologic/Lymphatic Complaints and Symptoms: Negative for: Bleeding / Clotting Disorders; Human Immunodeficiency Virus Medical History: Negative for: Anemia; Hemophilia; Human Immunodeficiency Virus; Lymphedema; Sickle Cell Disease Respiratory Evan [name], Evan [name] ([id number]) Complaints and Symptoms: Positive for: Shortness of Breath Negative for: Chronic or frequent coughs Medical History: Negative for: Aspiration; Asthma; Chronic Obstructive Pulmonary Disease (COPD); Pneumothorax; Sleep Apnea; Tuberculosis Cardiovascular Complaints and Symptoms: Negative for: Chest pain; LE edema Medical History: Positive for: Congestive Heart Failure - 3 months; Hypertension Negative for: Angina; Arrhythmia; Coronary Artery Disease; Deep Vein Thrombosis; Hypotension; Myocardial Infarction; Peripheral Arterial Disease; Peripheral Venous Disease; Phlebitis; Vasculitis Endocrine Complaints and Symptoms: No Complaints or Symptoms Complaints and Symptoms: Negative for: Hepatitis; Thyroid disease; Polydypsia (Excessive Thirst) Medical History: Negative for: Type I Diabetes; Type II Diabetes Genitourinary Complaints and Symptoms: No Complaints or Symptoms Complaints and Symptoms: Negative for: Kidney failure/ Dialysis; Incontinence/dribbling Medical History: Negative for: End Stage Renal Disease Immunological Complaints and Symptoms: No Complaints or Symptoms Complaints and Symptoms: Negative for: Hives; Itching Medical History: Negative for: Lupus Erythematosus; Raynaudos; Scleroderma Integumentary (Skin) Complaints and Symptoms: Positive for: Wounds - 1 Medical History: Negative for: History of Burn; History of pressure wounds Evan [name], Evan [name]  ([id number]) Musculoskeletal Complaints and Symptoms: Negative for: Muscle Pain; Muscle Weakness Medical History: Positive for: Rheumatoid Arthritis Negative for: Gout; Osteoarthritis; Osteomyelitis Neurologic Complaints and Symptoms: Negative for: Numbness/parasthesias; Focal/Weakness Medical History: Negative for: Dementia; Neuropathy;  Quadriplegia; Paraplegia; Seizure Disorder Psychiatric Complaints and Symptoms: Positive for: Anxiety Medical History: Negative for: Anorexia/bulimia; Confinement Anxiety Gastrointestinal Complaints and Symptoms: No Complaints or Symptoms Medical History: Negative for: Cirrhosis ; Colitis; Crohnos; Hepatitis A; Hepatitis B; Hepatitis C Oncologic Complaints and Symptoms: No Complaints or Symptoms Medical History: Positive for: Received Radiation - lung cancer 2 years Negative for: Received Chemotherapy HBO Extended History Items Eyes: Cataracts Immunizations Pneumococcal Vaccine: Received Pneumococcal Vaccination: Yes Implantable Devices None Family and Social History Cancer: No; Diabetes: Yes - Siblings; Heart Disease: Yes - Mother,Siblings; Hereditary Spherocytosis: No; Hypertension: Yes - Child; Kidney Disease: No; Lung Disease: No; Seizures: No; Stroke: No; Thyroid Problems: Yes - Child; Tuberculosis: No; Never smoker; Marital Status - Widowed; Alcohol Use: Never; Drug Use: No History; Caffeine Use: Never; Financial Concerns: No; Food, Clothing or Shelter Needs: No; Support System Lacking: No; Transportation Concerns: No; Advanced Evan [name], Evan [name] ([id number]) Directives: No; Patient does not want information on Advanced Directives; Do not resuscitate: No; Living Will: No; Medical Power of Attorney: No Electronic Signature(s) Signed: 06/22/2018 11:22:54 AM By: Army Melia Signed: 06/22/2018 11:59:00 AM By: Worthy Keeler PA-C Entered By: Army Melia on 06/22/2018 09:07:54 Evan [name], Evan [name]  ([id number]) -------------------------------------------------------------------------------- SuperBill Details Patient Name: Evan [name] Date of Service: 06/22/2018 Medical Record Number: [id number] Patient Account Number: [id number] Date of Birth/Sex: February 25, 1928 (85 y.o. M) Treating RN: Montey Hora Primary Care Provider: [name] Other Clinician: Referring Provider: [name] Treating Provider/Extender: Melburn Hake, Nettie Cromwell Weeks in Treatment: 0 Diagnosis Coding ICD-10 Codes Code Description S51.801A Unspecified open wound of right forearm, initial encounter L98.492 Non-pressure 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 fat layer exposed I10 Essential (primary) hypertension Z79.01 Long term (current) use of anticoagulants F42.39 Chronic systolic (congestive) heart failure Z85.118 Personal history of other malignant neoplasm of bronchus and lung Facility Procedures CPT4 Code Description: 53202334 99213 - WOUND CARE VISIT-LEV 3 EST PT Modifier: Quantity: 1 CPT4 Code Description: 35686168 11102-Tangential biopsy of skin (e.g., shave, scoop, saucerize, curette) single lesion ICD-10 Diagnosis Description L98.492 Non-pressure 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 fat layer ex Modifier: posed Quantity: 1 Physician Procedures CPT4 Code Description: 3729021 11552 - WC PHYS LEVEL 4 - EST PT ICD-10 Diagnosis Description S51.801A Unspecified open wound of right forearm, initial encounter L98.492 Non-pressure 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 fat layer I10 Essential  (primary) hypertension Z79.01 Long term (current) use of anticoagulants Modifier: 25 exposed Quantity: 1 CPT4 Code Description: 11102 Tangential biopsy of skin (e.g., shave, scoop, saucerize, curette) single lesion ICD-10 Diagnosis Description L98.492 Non-pressure 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

## 2018-06-26 NOTE — Progress Notes (Signed)
LASHAUN, KRAPF ([id number]) Visit Report for 06/22/2018 Allergy List Details Patient Name: Evan [name], Evan [name] Date of Service: 06/22/2018 8:45 AM Medical Record Number: [id number] Patient Account Number: [id number] Date of Birth/Sex: May 16, 1927 (85 y.o. M) Treating RN: Army Melia Primary Care Oree Hislop: [name] Other Clinician: Referring Zebulun Deman: [name] Treating Aelyn Stanaland/Extender: STONE III, HOYT Weeks in Treatment: 0 Allergies Active Allergies Feldene Reaction: blisters Severity: Moderate statins Reaction: myalgias Severity: Moderate doxycycline Reaction: unknown Severity: Moderate latex Reaction: rash Severity: Moderate Tequin Reaction: anxiety Severity: Moderate Valium Reaction: unknown Severity: Moderate Allergy Notes Electronic Signature(s) Signed: 06/22/2018 11:22:54 AM By: Army Melia Entered By: Army Melia on 06/22/2018 08:54:19 Evan [name] ([id number]) -------------------------------------------------------------------------------- Arrival Information Details Patient Name: Evan [name] Date of Service: 06/22/2018 8:45 AM Medical Record Number: [id number] Patient Account Number: [id number] Date of Birth/Sex: February 05, 1928 (85 y.o. M) Treating RN: Army Melia Primary Care Yailin Biederman: [name] Other Clinician: Referring Sally-Ann Cutbirth: [name] Treating Conny Situ/Extender: Melburn Hake, HOYT Weeks in Treatment: 0 Visit Information Patient Arrived: Ambulatory Arrival Time: 08:48 Accompanied By: self Transfer Assistance: None Patient Identification Verified: Yes Electronic Signature(s) Signed: 06/22/2018 11:22:54 AM By: Army Melia Entered By: Army Melia on 06/22/2018 08:49:09 Evan [name] ([id number]) -------------------------------------------------------------------------------- Clinic Level of Care Assessment Details Patient Name: Evan [name] Date of Service: 06/22/2018 8:45 AM Medical Record Number: [id number] Patient Account  Number: [id number] Date of Birth/Sex: 01/14/1928 (85 y.o. M) Treating RN: Montey Hora Primary Care Cerys Winget: [name] Other Clinician: Referring Adiyah Lame: [name] Treating Tasmin Exantus/Extender: Melburn Hake, HOYT Weeks in Treatment: 0 Clinic Level of Care Assessment Items TOOL 1 Quantity Score []  - Use when EandM and Procedure is performed on INITIAL visit 0 ASSESSMENTS - Nursing Assessment / Reassessment X - General Physical Exam (combine w/ comprehensive assessment (listed just below) when 1 20 performed on new pt. evals) X- 1 25 Comprehensive Assessment (HX, ROS, Risk Assessments, Wounds Hx, etc.) ASSESSMENTS - Wound and Skin Assessment / Reassessment X - Dermatologic / Skin Assessment (not related to wound area) 1 10 ASSESSMENTS - Ostomy and/or Continence Assessment and Care []  - Incontinence Assessment and Management 0 []  - 0 Ostomy Care Assessment and Management (repouching, etc.) PROCESS - Coordination of Care X - Simple Patient / Family Education for ongoing care 1 15 []  - 0 Complex (extensive) Patient / Family Education for ongoing care X- 1 10 Staff obtains Programmer, systems, Records, Test Results / Process Orders []  - 0 Staff telephones HHA, Nursing Homes / Clarify orders / etc []  - 0 Routine Transfer to another Facility (non-emergent condition) []  - 0 Routine Hospital Admission (non-emergent condition) X- 1 15 New Admissions / Biomedical engineer / Ordering NPWT, Apligraf, etc. []  - 0 Emergency Hospital Admission (emergent condition) PROCESS - Special Needs []  - Pediatric / Minor Patient Management 0 []  - 0 Isolation Patient Management []  - 0 Hearing / Language / Visual special needs []  - 0 Assessment of Community assistance (transportation, D/C planning, etc.) []  - 0 Additional assistance / Altered mentation []  - 0 Support Surface(s) Assessment (bed, cushion, seat, etc.) Evan [name], Evan [name] ([id number]) INTERVENTIONS - Miscellaneous []  - External ear  exam 0 []  - 0 Patient Transfer (multiple staff / Civil Service fast streamer / Similar devices) []  - 0 Simple Staple / Suture removal (25 or less) []  - 0 Complex Staple / Suture removal (26 or more) []  - 0 Hypo/Hyperglycemic Management (do not check if billed separately) []  - 0 Ankle / Brachial Index (ABI) - do not check if billed separately Has the  patient been seen at the hospital within the last three years: Yes Total Score: 95 Level Of Care: New/Established - Level 3 Electronic Signature(s) Signed: 06/22/2018 3:50:35 PM By: Montey Hora Entered By: Montey Hora on 06/22/2018 09:45:47 Evan [name] ([id number]) -------------------------------------------------------------------------------- Encounter Discharge Information Details Patient Name: Evan [name] Date of Service: 06/22/2018 8:45 AM Medical Record Number: [id number] Patient Account Number: [id number] Date of Birth/Sex: Feb 19, 1928 (85 y.o. M) Treating RN: Harold Barban Primary Care Raquell Richer: [name] Other Clinician: Referring Din Bookwalter: [name] Treating Wanell Lorenzi/Extender: Melburn Hake, HOYT Weeks in Treatment: 0 Encounter Discharge Information Items Post Procedure Vitals Discharge Condition: Stable Temperature (F): 98.4 Ambulatory Status: Ambulatory Pulse (bpm): 75 Discharge Destination: Home Respiratory Rate (breaths/min): 16 Transportation: Private Auto Blood Pressure (mmHg): 118/65 Accompanied By: family Schedule Follow-up Appointment: Yes Clinical Summary of Care: Electronic Signature(s) Signed: 06/22/2018 11:41:31 AM By: Harold Barban Entered By: Harold Barban on 06/22/2018 11:41:31 Evan [name] ([id number]) -------------------------------------------------------------------------------- Lower Extremity Assessment Details Patient Name: Evan [name] Date of Service: 06/22/2018 8:45 AM Medical Record Number: [id number] Patient Account Number: [id number] Date of Birth/Sex: 1927/08/10 (85 y.o.  M) Treating RN: Army Melia Primary Care Vera Furniss: [name] Other Clinician: Referring Kaedance Magos: [name] Treating Adenike Shidler/Extender: Melburn Hake, HOYT Weeks in Treatment: 0 Electronic Signature(s) Signed: 06/22/2018 11:22:54 AM By: Army Melia Entered By: Army Melia on 06/22/2018 09:13:40 Evan [name] ([id number]) -------------------------------------------------------------------------------- Multi Wound Chart Details Patient Name: Evan [name] Date of Service: 06/22/2018 8:45 AM Medical Record Number: [id number] Patient Account Number: [id number] Date of Birth/Sex: 07/19/1927 (85 y.o. M) Treating RN: Montey Hora Primary Care Dallis Darden: [name] Other Clinician: Referring Chanell Nadeau: [name] Treating Catlin Aycock/Extender: STONE III, HOYT Weeks in Treatment: 0 Vital Signs Height(in): 65 Pulse(bpm): 75 Weight(lbs): 175 Blood Pressure(mmHg): 118/65 Body Mass Index(BMI): 29 Temperature(F): 98.4 Respiratory Rate 16 (breaths/min): Photos: [N/A:N/A] Wound Location: Right Forearm N/A N/A Wounding Event: Trauma N/A N/A Primary Etiology: Trauma, Other N/A N/A Comorbid History: Cataracts, Congestive Heart N/A N/A Failure, Hypertension, Rheumatoid Arthritis, Received Radiation Date Acquired: 06/01/2018 N/A N/A Weeks of Treatment: 0 N/A N/A Wound Status: Open N/A N/A Measurements L x W x D 5x3.5x0.6 N/A N/A (cm) Area (cm) : 13.744 N/A N/A Volume (cm) : 8.247 N/A N/A % Reduction in Area: 0.00% N/A N/A % Reduction in Volume: 0.00% N/A N/A Classification: Partial Thickness N/A N/A Exudate Amount: Large N/A N/A Exudate Type: Sanguinous N/A N/A Exudate Color: red N/A N/A Wound Margin: Flat and Intact N/A N/A Granulation Amount: Small (1-33%) N/A N/A Granulation Quality: Red N/A N/A Necrotic Amount: Large (67-100%) N/A N/A Necrotic Tissue: Eschar, Adherent Slough N/A N/A Exposed Structures: Fat Layer (Subcutaneous N/A N/A Tissue) Exposed:  Yes Fascia: No Tendon: No Evan [name], Evan [name] ([id number]) Muscle: No Joint: No Bone: No Periwound Skin Texture: Excoriation: No N/A N/A Induration: No Callus: No Crepitus: No Rash: No Scarring: No Periwound Skin Moisture: Maceration: No N/A N/A Dry/Scaly: No Periwound Skin Color: Atrophie Blanche: No N/A N/A Cyanosis: No Ecchymosis: No Erythema: No Hemosiderin Staining: No Mottled: No Pallor: No Rubor: No Tenderness on Palpation: No N/A N/A Wound Preparation: Ulcer Cleansing: N/A N/A Rinsed/Irrigated with Saline Topical Anesthetic Applied: Other: lidocaine 4% Treatment Notes Electronic Signature(s) Signed: 06/22/2018 3:50:35 PM By: Montey Hora Entered By: Montey Hora on 06/22/2018 02:54:27 Evan [name], Evan [name] ([id number]) -------------------------------------------------------------------------------- Evan [name] Details Patient Name: Evan [name] Date of Service: 06/22/2018 8:45 AM Medical Record Number: [id number] Patient Account Number: [id number] Date of Birth/Sex: 1927/06/22 (85 y.o. M) Treating RN: Montey Hora  Primary Care Ricci Paff: [name] Other Clinician: Referring Diontae Route: [name] Treating Anastacia Reinecke/Extender: Melburn Hake, HOYT Weeks in Treatment: 0 Active Inactive Abuse / Safety / Falls / Self Care Management Nursing Diagnoses: Potential for falls Goals: Patient will not experience any injury related to falls Date Initiated: 06/22/2018 Target Resolution Date: 09/08/2018 Goal Status: Active Interventions: Assess fall risk on admission and as needed Notes: Necrotic Tissue Nursing Diagnoses: Impaired tissue integrity related to necrotic/devitalized tissue Goals: Patient/caregiver will verbalize understanding of reason and process for debridement of necrotic tissue Date Initiated: 06/22/2018 Target Resolution Date: 09/08/2018 Goal Status: Active Interventions: Provide education on necrotic tissue and debridement  process Notes: Orientation to the Wound Care Program Nursing Diagnoses: Knowledge deficit related to the wound healing center program Goals: Patient/caregiver will verbalize understanding of the [facility] Program Date Initiated: 06/22/2018 Target Resolution Date: 09/08/2018 Goal Status: Active Interventions: Provide education on orientation to the wound center Evan [name] ([id number]) Notes: Wound/Skin Impairment Nursing Diagnoses: Impaired tissue integrity Goals: Ulcer/skin breakdown will heal within 14 weeks Date Initiated: 06/22/2018 Target Resolution Date: 09/08/2018 Goal Status: Active Interventions: Assess patient/caregiver ability to obtain necessary supplies Assess patient/caregiver ability to perform ulcer/skin care regimen upon admission and as needed Assess ulceration(s) every visit Notes: Electronic Signature(s) Signed: 06/22/2018 3:50:35 PM By: Montey Hora Entered By: Montey Hora on 06/22/2018 09:29:14 Evan [name] ([id number]) -------------------------------------------------------------------------------- Pain Assessment Details Patient Name: Evan [name] Date of Service: 06/22/2018 8:45 AM Medical Record Number: [id number] Patient Account Number: [id number] Date of Birth/Sex: 02-28-1928 (85 y.o. M) Treating RN: Army Melia Primary Care Reeves Musick: [name] Other Clinician: Referring Adelyne Marchese: [name] Treating Kwali Wrinkle/Extender: Melburn Hake, HOYT Weeks in Treatment: 0 Active Problems Location of Pain Severity and Description of Pain Patient Has Paino No Site Locations Pain Management and Medication Current Pain Management: Electronic Signature(s) Signed: 06/22/2018 11:22:54 AM By: Army Melia Entered By: Army Melia on 06/22/2018 08:49:15 Evan [name] ([id number]) -------------------------------------------------------------------------------- Patient/Caregiver Education Details Patient Name: Evan [name] Date of  Service: 06/22/2018 8:45 AM Medical Record Number: [id number] Patient Account Number: [id number] Date of Birth/Gender: [birth year] (85 y.o. M) Treating RN: Montey Hora Primary Care Physician: [name] Other Clinician: Referring Physician: [name] Treating Physician/Extender: Sharalyn Ink in Treatment: 0 Education Assessment Education Provided To: Patient and Caregiver Education Topics Provided Wound/Skin Impairment: Handouts: Other: wound care as ordered Methods: Demonstration, Explain/Verbal Responses: State content correctly Electronic Signature(s) Signed: 06/26/2018 9:04:30 AM By: Harold Barban Entered By: Harold Barban on 06/22/2018 11:41:36 Evan [name] ([id number]) -------------------------------------------------------------------------------- Wound Assessment Details Patient Name: Evan [name] Date of Service: 06/22/2018 8:45 AM Medical Record Number: [id number] Patient Account Number: [id number] Date of Birth/Sex: 10-21-1927 (85 y.o. M) Treating RN: Army Melia Primary Care Daiki Dicostanzo: [name] Other Clinician: Referring Mathan Darroch: [name] Treating Eola Waldrep/Extender: STONE III, HOYT Weeks in Treatment: 0 Wound Status Wound Number: 1 Primary Trauma, Other Etiology: Wound Location: Right Forearm Wound Open Wounding Event: Trauma Status: Date Acquired: 06/01/2018 Comorbid Cataracts, Congestive Heart Failure, Weeks Of Treatment: 0 History: Hypertension, Rheumatoid Arthritis, Received Clustered Wound: No Radiation Photos Wound Measurements Length: (cm) 5 % Reduction i Width: (cm) 3.5 % Reduction i Depth: (cm) 0.6 Tunneling: Area: (cm) 13.744 Undermining: Volume: (cm) 8.247 n Area: 0% n Volume: 0% No No Wound Description Classification: Partial Thickness Foul Odor Af Wound Margin: Flat and Intact Slough/Fibri Exudate Amount: Large Exudate Type: Sanguinous Exudate Color: red ter Cleansing: No no No Wound  Bed Granulation Amount: Small (1-33%) Exposed Structure Granulation Quality:  Red Fascia Exposed: No Necrotic Amount: Large (67-100%) Fat Layer (Subcutaneous Tissue) Exposed: Yes Necrotic Quality: Eschar, Adherent Slough Tendon Exposed: No Muscle Exposed: No Joint Exposed: No Bone Exposed: No Periwound Skin Texture Texture Color MUNG, RINKER ([id number]) No Abnormalities Noted: No No Abnormalities Noted: No Callus: No Atrophie Blanche: No Crepitus: No Cyanosis: No Excoriation: No Ecchymosis: No Induration: No Erythema: No Rash: No Hemosiderin Staining: No Scarring: No Mottled: No Pallor: No Moisture Rubor: No No Abnormalities Noted: No Dry / Scaly: No Maceration: No Wound Preparation Ulcer Cleansing: Rinsed/Irrigated with Saline Topical Anesthetic Applied: Other: lidocaine 4%, Treatment Notes Wound #1 (Right Forearm) Notes santyl, wet to dry gauze, ABD, coban Electronic Signature(s) Signed: 06/22/2018 11:22:54 AM By: Army Melia Entered By: Army Melia on 06/22/2018 09:14:53 Evan [name] ([id number]) -------------------------------------------------------------------------------- Vitals Details Patient Name: Evan [name] Date of Service: 06/22/2018 8:45 AM Medical Record Number: [id number] Patient Account Number: [id number] Date of Birth/Sex: 01/20/1928 (85 y.o. M) Treating RN: Army Melia Primary Care [place] Code: [name] Other Clinician: Referring Caridad Silveira: [name] Treating Normand Damron/Extender: Melburn Hake, HOYT Weeks in Treatment: 0 Vital Signs Time Taken: 08:48 Temperature (F): 98.4 Height (in): 65 Pulse (bpm): 75 Source: Stated Respiratory Rate (breaths/min): 16 Weight (lbs): 175 Blood Pressure (mmHg): 118/65 Source: Measured Reference Range: 80 - 120 mg / dl Body Mass Index (BMI): 29.1 Electronic Signature(s) Signed: 06/22/2018 11:22:54 AM By: Army Melia Entered By: Army Melia on 06/22/2018 08:49:47

## 2018-06-27 DIAGNOSIS — I2699 Other pulmonary embolism without acute cor pulmonale: Secondary | ICD-10-CM | POA: Diagnosis not present

## 2018-06-27 DIAGNOSIS — I110 Hypertensive heart disease with heart failure: Secondary | ICD-10-CM | POA: Diagnosis not present

## 2018-06-27 DIAGNOSIS — R21 Rash and other nonspecific skin eruption: Secondary | ICD-10-CM | POA: Diagnosis not present

## 2018-06-27 DIAGNOSIS — L03113 Cellulitis of right upper limb: Secondary | ICD-10-CM | POA: Diagnosis not present

## 2018-06-27 DIAGNOSIS — C3490 Malignant neoplasm of unspecified part of unspecified bronchus or lung: Secondary | ICD-10-CM | POA: Diagnosis not present

## 2018-06-27 DIAGNOSIS — I482 Chronic atrial fibrillation, unspecified: Secondary | ICD-10-CM | POA: Diagnosis not present

## 2018-06-27 DIAGNOSIS — S40021D Contusion of right upper arm, subsequent encounter: Secondary | ICD-10-CM | POA: Diagnosis not present

## 2018-06-29 ENCOUNTER — Other Ambulatory Visit: Payer: Self-pay

## 2018-06-29 ENCOUNTER — Encounter: Payer: Medicare HMO | Admitting: Physician Assistant

## 2018-06-29 DIAGNOSIS — S51811A Laceration without foreign body of right forearm, initial encounter: Secondary | ICD-10-CM | POA: Diagnosis not present

## 2018-06-29 DIAGNOSIS — I5022 Chronic systolic (congestive) heart failure: Secondary | ICD-10-CM | POA: Diagnosis not present

## 2018-06-29 DIAGNOSIS — Z7901 Long term (current) use of anticoagulants: Secondary | ICD-10-CM | POA: Diagnosis not present

## 2018-06-29 DIAGNOSIS — M069 Rheumatoid arthritis, unspecified: Secondary | ICD-10-CM | POA: Diagnosis not present

## 2018-06-29 DIAGNOSIS — L98492 Non-pressure 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 fat layer exposed: Secondary | ICD-10-CM | POA: Diagnosis not present

## 2018-06-29 DIAGNOSIS — L299 Pruritus, unspecified: Secondary | ICD-10-CM | POA: Diagnosis not present

## 2018-06-29 DIAGNOSIS — Z85118 Personal history of other malignant neoplasm of bronchus and lung: Secondary | ICD-10-CM | POA: Diagnosis not present

## 2018-06-29 DIAGNOSIS — E11622 Type 2 diabetes mellitus with other skin ulcer: Secondary | ICD-10-CM | POA: Diagnosis not present

## 2018-06-29 DIAGNOSIS — I110 Hypertensive heart disease with heart failure: Secondary | ICD-10-CM | POA: Diagnosis not present

## 2018-06-30 NOTE — Progress Notes (Signed)
Evan [name], Evan [name] ([id number]) Visit Report for 06/29/2018 Chief Complaint Document Details Patient Name: Evan [name], Evan [name] Date of Service: 06/29/2018 9:00 AM Medical Record Number: [id number] Patient Account Number: [id number] Date of Birth/Sex: February 27, 1928 (85 y.o. M) Treating RN: Montey Hora Primary Care Provider: [name] Other Clinician: Referring Provider: [name] Treating Provider/Extender: Melburn Hake, HOYT Weeks in Treatment: 1 Information Obtained from: Patient Chief Complaint Right forearm skin tear Electronic Signature(s) Signed: 06/29/2018 4:16:42 PM By: Worthy Keeler PA-C Entered By: Worthy Keeler on 06/29/2018 09:00:53 Evan [name] ([id number]) -------------------------------------------------------------------------------- Debridement Details Patient Name: Evan [name] Date of Service: 06/29/2018 9:00 AM Medical Record Number: [id number] Patient Account Number: [id number] Date of Birth/Sex: 1927-07-23 (85 y.o. M) Treating RN: Montey Hora Primary Care Provider: [name] Other Clinician: Referring Provider: [name] Treating Provider/Extender: Melburn Hake, HOYT Weeks in Treatment: 1 Debridement Performed for Wound #1 Right Forearm Assessment: Performed By: Physician STONE III, HOYT E., PA-C Debridement Type: Chemical/Enzymatic/Mechanical Agent Used: Santyl Level of Consciousness (Pre- Awake and Alert procedure): Pre-procedure Verification/Time Yes - 09:53 Out Taken: Start Time: 09:53 Pain Control: Lidocaine 4% Topical Solution Instrument: Other : tongue blade Bleeding: None End Time: 09:54 Procedural Pain: 0 Post Procedural Pain: 0 Response to Treatment: Procedure was tolerated well Level of Consciousness Awake and Alert (Post-procedure): Post Debridement Measurements of Total Wound Length: (cm) 4 Width: (cm) 3.3 Depth: (cm) 0.4 Volume: (cm) 4.147 Character of Wound/Ulcer Post Debridement: Improved Post Procedure  Diagnosis Same as Pre-procedure Electronic Signature(s) Signed: 06/29/2018 4:16:42 PM By: Worthy Keeler PA-C Signed: 06/29/2018 4:37:01 PM By: Montey Hora Entered By: Montey Hora on 06/29/2018 09:54:36 Evan [name] ([id number]) -------------------------------------------------------------------------------- HPI Details Patient Name: Evan [name] Date of Service: 06/29/2018 9:00 AM Medical Record Number: [id number] Patient Account Number: [id number] Date of Birth/Sex: 06-11-1927 (85 y.o. M) Treating RN: Montey Hora Primary Care Provider: [name] Other Clinician: Referring Provider: [name] Treating Provider/Extender: Melburn Hake, HOYT Weeks in Treatment: 1 History of Present Illness HPI Description: 06/22/18 on evaluation today patient presents for initial inspection here in our clinic concerning an ulcer on the right dorsal forearm. The exact timing of this is somewhat ambiguous although it appears to be possibly around one month from on set. I am told by the patient's daughter was present during the evaluation today with him that he had an area which was itching on the right forearm and subsequently scratched it because in an open wound that led to a hematoma that had to be drained twice when he went to the ER. That was around 06/08/18. Subsequently that was not really effective and he ended up with a congealed hematoma under the area which has since opened. He has been seen by his primary care provider as well and placed on Keflex which he is currently taking. The patient is on Lovenox twice a day 80 mg. He apparently fell treatment with eloquence he still developed multiple blood clots even on the Eliquis. Therefore he does have issues with bleeding in general. With that being said the patient does have a history of hypertension congestive heart failure, long-term use of anticoagulants, and a personal history of lung cancer which is currently in remission. He has  never had any skin cancer issues that they are aware of. He does have a consult which was placed by his primary care provider to the [facility] in [place] although he has not been for that as of yet in fact they do not even have an  appointment time. Fortunately there's no evidence of active infection. No fevers, chills, nausea, or vomiting noted at this time. 06/29/18 on evaluation today patient's wound actually appears to be doing significantly better which is great news. He actually did get the [facility] field and has been using that. Subsequently he also has a negative pathology report the show granulation chronic inflammation but no evidence of a skin cancer. This is also great news. Overall very pleased everything seems to be doing much better compared to the previous evaluation. The patient is glad to hear this as well. Electronic Signature(s) Signed: 06/29/2018 4:16:42 PM By: Worthy Keeler PA-C Entered By: Worthy Keeler on 06/29/2018 09:55:55 Evan [name] ([id number]) -------------------------------------------------------------------------------- Physical Exam Details Patient Name: Evan [name] Date of Service: 06/29/2018 9:00 AM Medical Record Number: [id number] Patient Account Number: [id number] Date of Birth/Sex: 19-Jul-1927 (85 y.o. M) Treating RN: Montey Hora Primary Care Provider: [name] Other Clinician: Referring Provider: [name] Treating Provider/Extender: STONE III, HOYT Weeks in Treatment: 1 Constitutional Well-nourished and well-hydrated in no acute distress. Respiratory normal breathing without difficulty. clear to auscultation bilaterally. Cardiovascular regular rate and rhythm with normal S1, S2. Psychiatric this patient is able to make decisions and demonstrates good insight into disease process. Alert and Oriented x 3. pleasant and cooperative. Notes Upon inspection today patient's wound bed actually shows evidence of good  granulation at this time he seems to be doing well with the Santyl and I'm very pleased with the progress that has been made just in one weeks time. I'm also happy with the fact that again he seems to be doing excellent in regard to their being no evidence of active infection and the fact that his pathology was negative. Electronic Signature(s) Signed: 06/29/2018 4:16:42 PM By: Worthy Keeler PA-C Entered By: Worthy Keeler on 06/29/2018 09:56:34 Evan [name] ([id number]) -------------------------------------------------------------------------------- Physician Orders Details Patient Name: Evan [name] Date of Service: 06/29/2018 9:00 AM Medical Record Number: [id number] Patient Account Number: [id number] Date of Birth/Sex: 15-May-1927 (85 y.o. M) Treating RN: Montey Hora Primary Care Provider: [name] Other Clinician: Referring Provider: [name] Treating Provider/Extender: Melburn Hake, HOYT Weeks in Treatment: 1 Verbal / Phone Orders: No Diagnosis Coding ICD-10 Coding Code Description S51.801A Unspecified open wound of right forearm, initial encounter L98.492 Non-pressure 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 fat layer exposed I10 Essential (primary) hypertension Z79.01 Long term (current) use of anticoagulants X79.39 Chronic systolic (congestive) heart failure Z85.118 Personal history of other malignant neoplasm of bronchus and lung Wound Cleansing Wound #1 Right Forearm o Clean wound with Normal Saline. o May Shower, gently pat wound dry prior to applying new dressing. Anesthetic (add to Medication List) Wound #1 Right Forearm o Topical Lidocaine 4% cream applied to wound bed prior to debridement (In Clinic Only). o Injected 2% Xylocaine MPF prior to debridement (In Clinic Only). Primary Wound Dressing Wound #1 Right Forearm o Santyl Ointment Secondary Dressing Wound #1 Right Forearm o Gauze, ABD and Kerlix/Conform - secure lightly with  coban Dressing Change Frequency Wound #1 Right Forearm o Change dressing every day. Follow-up Appointments Wound #1 Right Forearm o Return Appointment in 1 week. Medications-please add to medication list. Wound #1 Right Forearm o [address] RONNE, STEFANSKI ([id number]) Electronic Signature(s) Signed: 06/29/2018 4:16:42 PM By: Worthy Keeler PA-C Signed: 06/29/2018 4:37:01 PM By: Montey Hora Entered By: Montey Hora on 06/29/2018 09:53:06 HAO, DION ([id number]) -------------------------------------------------------------------------------- Problem List Details Patient Name: Evan [name], WEDIG.  Date of Service: 06/29/2018 9:00 AM Medical Record Number: [id number] Patient Account Number: [id number] Date of Birth/Sex: Aug 19, 1927 (85 y.o. M) Treating RN: Montey Hora Primary Care Provider: [name] Other Clinician: Referring Provider: [name] Treating Provider/Extender: Melburn Hake, HOYT Weeks in Treatment: 1 Active Problems ICD-10 Evaluated Encounter Code Description Active Date Today Diagnosis S51.801A Unspecified open wound of right forearm, initial encounter 06/22/2018 No Yes L98.492 Non-pressure 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 fat layer 06/22/2018 No Yes exposed [place] (primary) hypertension 06/22/2018 No Yes Z79.01 Long term (current) use of anticoagulants 06/22/2018 No Yes V78.46 Chronic systolic (congestive) heart failure 06/22/2018 No Yes Z85.118 Personal history of other malignant neoplasm of bronchus 06/22/2018 No Yes and lung Inactive Problems Resolved Problems Electronic Signature(s) Signed: 06/29/2018 4:16:42 PM By: Worthy Keeler PA-C Entered By: Worthy Keeler on 06/29/2018 09:00:49 Evan [name] ([id number]) -------------------------------------------------------------------------------- Progress Note Details Patient Name: Evan [name] Date of Service: 06/29/2018 9:00 AM Medical Record Number:  [id number] Patient Account Number: [id number] Date of Birth/Sex: November 14, 1927 (85 y.o. M) Treating RN: Montey Hora Primary Care Provider: [name] Other Clinician: Referring Provider: [name] Treating Provider/Extender: Melburn Hake, HOYT Weeks in Treatment: 1 Subjective Chief Complaint Information obtained from Patient Right forearm skin tear History of Present Illness (HPI) 06/22/18 on evaluation today patient presents for initial inspection here in our clinic concerning an ulcer on the right dorsal forearm. The exact timing of this is somewhat ambiguous although it appears to be possibly around one month from on set. I am told by the patient's daughter was present during the evaluation today with him that he had an area which was itching on the right forearm and subsequently scratched it because in an open wound that led to a hematoma that had to be drained twice when he went to the ER. That was around 06/08/18. Subsequently that was not really effective and he ended up with a congealed hematoma under the area which has since opened. He has been seen by his primary care provider as well and placed on Keflex which he is currently taking. The patient is on Lovenox twice a day 80 mg. He apparently fell treatment with eloquence he still developed multiple blood clots even on the Eliquis. Therefore he does have issues with bleeding in general. With that being said the patient does have a history of hypertension congestive heart failure, long-term use of anticoagulants, and a personal history of lung cancer which is currently in remission. He has never had any skin cancer issues that they are aware of. He does have a consult which was placed by his primary care provider to the [facility] in [place] although he has not been for that as of yet in fact they do not even have an appointment time. Fortunately there's no evidence of active infection. No fevers, chills, nausea, or  vomiting noted at this time. 06/29/18 on evaluation today patient's wound actually appears to be doing significantly better which is great news. He actually did get the [facility] field and has been using that. Subsequently he also has a negative pathology report the show granulation chronic inflammation but no evidence of a skin cancer. This is also great news. Overall very pleased everything seems to be doing much better compared to the previous evaluation. The patient is glad to hear this as well. Patient History Information obtained from Patient. Family History Diabetes - Siblings, Heart Disease - Mother,Siblings, Hypertension - Child, Thyroid Problems - Child, No family history  of Cancer, Hereditary Spherocytosis, Kidney Disease, Lung Disease, Seizures, Stroke, Tuberculosis. Social History Never smoker, Marital Status - Widowed, Alcohol Use - Never, Drug Use - No History, Caffeine Use - Never. Medical History Eyes Patient has history of Cataracts Denies history of Glaucoma, Optic Neuritis Ear/Nose/Mouth/Throat Denies history of Chronic sinus problems/congestion, Middle ear problems Hematologic/Lymphatic Denies history of Anemia, Hemophilia, Human Immunodeficiency Virus, Lymphedema, Sickle Cell Disease Respiratory Denies history of Aspiration, Asthma, Chronic Obstructive Pulmonary Disease (COPD), Pneumothorax, Sleep Apnea, Tuberculosis Evan [name], Evan [name] ([id number]) Cardiovascular Patient has history of Congestive Heart Failure - 3 months, Hypertension Denies history of Angina, Arrhythmia, Coronary Artery Disease, Deep Vein Thrombosis, Hypotension, Myocardial Infarction, Peripheral Arterial Disease, Peripheral Venous Disease, Phlebitis, Vasculitis Gastrointestinal Denies history of Cirrhosis , Colitis, Crohn s, Hepatitis A, Hepatitis B, Hepatitis C Endocrine Denies history of Type I Diabetes, Type II Diabetes Genitourinary Denies history of End Stage Renal Disease Immunological Denies  history of Lupus Erythematosus, Raynaud s, Scleroderma Integumentary (Skin) Denies history of History of Burn, History of pressure wounds Musculoskeletal Patient has history of Rheumatoid Arthritis Denies history of Gout, Osteoarthritis, Osteomyelitis Neurologic Denies history of Dementia, Neuropathy, Quadriplegia, Paraplegia, Seizure Disorder Oncologic Patient has history of Received Radiation - lung cancer 2 years Denies history of Received Chemotherapy Psychiatric Denies history of Anorexia/bulimia, Confinement Anxiety Review of Systems (ROS) Constitutional Symptoms (General Health) Denies complaints or symptoms of Fever, Chills. Respiratory The patient has no complaints or symptoms. Cardiovascular The patient has no complaints or symptoms. Psychiatric The patient has no complaints or symptoms. Objective Constitutional Well-nourished and well-hydrated in no acute distress. Vitals Time Taken: 9:20 AM, Height: 65 in, Weight: 175 lbs, BMI: 29.1, Temperature: 97.6 F, Pulse: 73 bpm, Respiratory Rate: 18 breaths/min, Blood Pressure: 138/69 mmHg. Respiratory normal breathing without difficulty. clear to auscultation bilaterally. Cardiovascular regular rate and rhythm with normal S1, S2. Psychiatric Evan [name], Evan [name] ([id number]) this patient is able to make decisions and demonstrates good insight into disease process. Alert and Oriented x 3. pleasant and cooperative. General Notes: Upon inspection today patient's wound bed actually shows evidence of good granulation at this time he seems to be doing well with the Santyl and I'm very pleased with the progress that has been made just in one weeks time. I'm also happy with the fact that again he seems to be doing excellent in regard to their being no evidence of active infection and the fact that his pathology was negative. Integumentary (Hair, Skin) Wound #1 status is Open. Original cause of wound was Trauma. The wound is located on  the Right Forearm. The wound measures 4cm length x 3.3cm width x 0.4cm depth; 10.367cm^2 area and 4.147cm^3 volume. There is Fat Layer (Subcutaneous Tissue) Exposed exposed. There is no tunneling or undermining noted. There is a large amount of sanguinous drainage noted. The wound margin is flat and intact. There is small (1-33%) red granulation within the wound bed. There is a medium (34-66%) amount of necrotic tissue within the wound bed including Eschar and Adherent Slough. The periwound skin appearance did not exhibit: Callus, Crepitus, Excoriation, Induration, Rash, Scarring, Dry/Scaly, Maceration, Atrophie Blanche, Cyanosis, Ecchymosis, Hemosiderin Staining, Mottled, Pallor, Rubor, Erythema. The periwound has tenderness on palpation. Assessment Active Problems ICD-10 Unspecified open wound of right forearm, initial encounter Non-pressure 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 fat layer exposed Essential (primary) hypertension Long term (current) use of anticoagulants Chronic systolic (congestive) heart failure Personal history of other malignant neoplasm of bronchus and lung Procedures Wound #1 Pre-procedure diagnosis  of Wound #1 is a Trauma, Other located on the Right Forearm . There was a Chemical/Enzymatic/Mechanical debridement performed by STONE III, HOYT E., PA-C. With the following instrument(s): tongue blade after achieving pain control using Lidocaine 4% Topical Solution. Agent used was [employer]. A time out was conducted at 09:53, prior to the start of the procedure. There was no bleeding. The procedure was tolerated well with a pain level of 0 throughout and a pain level of 0 following the procedure. Post Debridement Measurements: 4cm length x 3.3cm width x 0.4cm depth; 4.147cm^3 volume. Character of Wound/Ulcer Post Debridement is improved. Post procedure Diagnosis Wound #1: Same as Pre-Procedure Plan Evan [name], Evan [name] ([id number]) Wound Cleansing: Wound #1 Right  Forearm: Clean wound with Normal Saline. May Shower, gently pat wound dry prior to applying new dressing. Anesthetic (add to Medication List): Wound #1 Right Forearm: Topical Lidocaine 4% cream applied to wound bed prior to debridement (In Clinic Only). Injected 2% Xylocaine MPF prior to debridement (In Clinic Only). Primary Wound Dressing: Wound #1 Right Forearm: Santyl Ointment Secondary Dressing: Wound #1 Right Forearm: Gauze, ABD and Kerlix/Conform - secure lightly with coban Dressing Change Frequency: Wound #1 Right Forearm: Change dressing every day. Follow-up Appointments: Wound #1 Right Forearm: Return Appointment in 1 week. Medications-please add to medication list.: Wound #1 Right Forearm: Santyl Enzymatic Ointment I'm gonna recommend at this point that we go ahead and continue with the above wound care measures for the next week patients in agreement the plan. We will see were things stand at follow-up. If anything changes worsens meantime will contact the office and let me know. Otherwise my hope is this will continue to show signs of good improvement over the next week. Please see above for specific wound care orders. We will see patient for re-evaluation in 1 week(s) here in the clinic. If anything worsens or changes patient will contact our office for additional recommendations. Electronic Signature(s) Signed: 06/29/2018 4:16:42 PM By: Worthy Keeler PA-C Entered By: Worthy Keeler on 06/29/2018 09:57:02 Evan [name] ([id number]) -------------------------------------------------------------------------------- ROS/PFSH Details Patient Name: Evan [name] Date of Service: 06/29/2018 9:00 AM Medical Record Number: [id number] Patient Account Number: [id number] Date of Birth/Sex: [birth year] (85 y.o. M) Treating RN: Montey Hora Primary Care Provider: [name] Other Clinician: Referring Provider: [name] Treating Provider/Extender: Melburn Hake,  HOYT Weeks in Treatment: 1 Information Obtained From Patient Wound History Do you currently have one or more open woundso Yes How many open wounds do you currently haveo 1 Approximately how long have you had your woundso 3 weeks How have you been treating your wound(s) until nowo pressure dressing Has your wound(s) ever healed and then re-openedo No Have you had any lab work done in the past montho Yes Who ordered the lab work doneo [place] Have you tested positive for an antibiotic resistant organism (MRSA, VRE)o No Have you tested positive for osteomyelitis (bone infection)o No Have you had any tests for circulation on your legso No Constitutional Symptoms (General Health) Complaints and Symptoms: Negative for: Fever; Chills Eyes Medical History: Positive for: Cataracts Negative for: Glaucoma; Optic Neuritis Ear/Nose/Mouth/Throat Medical History: Negative for: Chronic sinus problems/congestion; Middle ear problems Hematologic/Lymphatic Medical History: Negative for: Anemia; Hemophilia; Human Immunodeficiency Virus; Lymphedema; Sickle Cell Disease Respiratory Complaints and Symptoms: No Complaints or Symptoms Medical History: Negative for: Aspiration; Asthma; Chronic Obstructive Pulmonary Disease (COPD); Pneumothorax; Sleep Apnea; Tuberculosis Cardiovascular Complaints and Symptoms: No Complaints or Symptoms Evan [name], Evan [name] ([id number]) Medical History: Positive for: Congestive  Heart Failure - 3 months; Hypertension Negative for: Angina; Arrhythmia; Coronary Artery Disease; Deep Vein Thrombosis; Hypotension; Myocardial Infarction; Peripheral Arterial Disease; Peripheral Venous Disease; Phlebitis; Vasculitis Gastrointestinal Medical History: Negative for: Cirrhosis ; Colitis; Crohnos; Hepatitis A; Hepatitis B; Hepatitis C Endocrine Medical History: Negative for: Type I Diabetes; Type II Diabetes Genitourinary Medical History: Negative for: End Stage Renal  Disease Immunological Medical History: Negative for: Lupus Erythematosus; Raynaudos; Scleroderma Integumentary (Skin) Medical History: Negative for: History of Burn; History of pressure wounds Musculoskeletal Medical History: Positive for: Rheumatoid Arthritis Negative for: Gout; Osteoarthritis; Osteomyelitis Neurologic Medical History: Negative for: Dementia; Neuropathy; Quadriplegia; Paraplegia; Seizure Disorder Oncologic Medical History: Positive for: Received Radiation - lung cancer 2 years Negative for: Received Chemotherapy Psychiatric Complaints and Symptoms: No Complaints or Symptoms Medical History: Negative for: Anorexia/bulimia; Confinement Anxiety HBO Extended History Items Eyes: Cataracts Evan [name], Evan [name] ([id number]) Immunizations Pneumococcal Vaccine: Received Pneumococcal Vaccination: Yes Implantable Devices None Family and Social History Cancer: No; Diabetes: Yes - Siblings; Heart Disease: Yes - Mother,Siblings; Hereditary Spherocytosis: No; Hypertension: Yes - Child; Kidney Disease: No; Lung Disease: No; Seizures: No; Stroke: No; Thyroid Problems: Yes - Child; Tuberculosis: No; Never smoker; Marital Status - Widowed; Alcohol Use: Never; Drug Use: No History; Caffeine Use: Never; Financial Concerns: No; Food, Clothing or Shelter Needs: No; Support System Lacking: No; Transportation Concerns: No; Advanced Directives: No; Patient does not want information on Advanced Directives; Do not resuscitate: No; Living Will: No; Medical Power of Attorney: No Physician Affirmation I have reviewed and agree with the above information. Electronic Signature(s) Signed: 06/29/2018 4:16:42 PM By: Worthy Keeler PA-C Signed: 06/29/2018 4:37:01 PM By: Montey Hora Entered By: Worthy Keeler on 06/29/2018 09:56:12 Evan [name], Evan [name] ([id number]) -------------------------------------------------------------------------------- SuperBill Details Patient Name: Evan [name] Date of  Service: 06/29/2018 Medical Record Number: [id number] Patient Account Number: [id number] Date of Birth/Sex: [birth year] (85 y.o. M) Treating RN: Montey Hora Primary Care Provider: [name] Other Clinician: Referring Provider: [name] Treating Provider/Extender: Melburn Hake, HOYT Weeks in Treatment: 1 Diagnosis Coding ICD-10 Codes Code Description S51.801A Unspecified open wound of right forearm, initial encounter L98.492 Non-pressure 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 fat layer exposed I10 Essential (primary) hypertension Z79.01 Long term (current) use of anticoagulants B44.96 Chronic systolic (congestive) heart failure Z85.118 Personal history of other malignant neoplasm of bronchus and lung Facility Procedures CPT4 Code: 75916384 Description: 66599 - DEBRIDE W/O ANES NON SELECT Modifier: Quantity: 1 Physician Procedures CPT4 Code: 3570177 Description: 93903 - WC PHYS LEVEL 4 - EST PT ICD-10 Diagnosis Description S51.801A Unspecified open wound of right forearm, initial encounter L98.492 Non-pressure 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 fat l I10 Essential (primary) hypertension  Z79.01 Long term (current) use of anticoagulants Modifier: ayer exposed Quantity: 1 Electronic Signature(s) Signed: 06/29/2018 4:16:42 PM By: Worthy Keeler PA-C Entered By: Worthy Keeler on 06/29/2018 09:57:16

## 2018-06-30 NOTE — Progress Notes (Signed)
EULIS, SALAZAR ([id number]) Visit Report for 06/29/2018 Arrival Information Details Patient Name: Evan [name], Evan [name] Date of Service: 06/29/2018 9:00 AM Medical Record Number: [id number] Patient Account Number: [id number] Date of Birth/Sex: 24-Apr-1927 (85 y.o. M) Treating RN: Harold Barban Primary Care Kamylah Manzo: [name] Other Clinician: Referring Airlie Blumenberg: [name] Treating Aarti Mankowski/Extender: Melburn Hake, HOYT Weeks in Treatment: 1 Visit Information History Since Last Visit Added or deleted any medications: No Patient Arrived: Ambulatory Any new allergies or adverse reactions: No Arrival Time: 09:18 Had a fall or experienced change in No Accompanied By: daughter activities of daily living that may affect Transfer Assistance: None risk of falls: Patient Identification Verified: Yes Signs or symptoms of abuse/neglect since last visito No Secondary Verification Process Completed: Yes Hospitalized since last visit: No Has Dressing in Place as Prescribed: Yes Pain Present Now: Yes Electronic Signature(s) Signed: 06/29/2018 3:52:12 PM By: Harold Barban Entered By: Harold Barban on 06/29/2018 09:20:23 Evan [name] ([id number]) -------------------------------------------------------------------------------- Encounter Discharge Information Details Patient Name: Evan [name] Date of Service: 06/29/2018 9:00 AM Medical Record Number: [id number] Patient Account Number: [id number] Date of Birth/Sex: 10-10-1927 (85 y.o. M) Treating RN: Army Melia Primary Care Kitty Cadavid: [name] Other Clinician: Referring Sheron Tallman: [name] Treating Rashia Mckesson/Extender: Melburn Hake, HOYT Weeks in Treatment: 1 Encounter Discharge Information Items Post Procedure Vitals Discharge Condition: Stable Temperature (F): 97.6 Ambulatory Status: Ambulatory Pulse (bpm): 83 Discharge Destination: Home Respiratory Rate (breaths/min): 16 Transportation: Private Auto Blood Pressure (mmHg):  178/69 Accompanied By: self Schedule Follow-up Appointment: Yes Clinical Summary of Care: Electronic Signature(s) Signed: 06/29/2018 11:52:41 AM By: Army Melia Entered By: Army Melia on 06/29/2018 10:04:45 Evan [name] ([id number]) -------------------------------------------------------------------------------- Lower Extremity Assessment Details Patient Name: Evan [name] Date of Service: 06/29/2018 9:00 AM Medical Record Number: [id number] Patient Account Number: [id number] Date of Birth/Sex: 1928-01-16 (85 y.o. M) Treating RN: Harold Barban Primary Care Friend Dorfman: [name] Other Clinician: Referring Lowry Bala: [name] Treating Ramzey Petrovic/Extender: Melburn Hake, HOYT Weeks in Treatment: 1 Electronic Signature(s) Signed: 06/29/2018 3:52:12 PM By: Harold Barban Entered By: Harold Barban on 06/29/2018 09:26:53 Evan [name] ([id number]) -------------------------------------------------------------------------------- Multi Wound Chart Details Patient Name: Evan [name] Date of Service: 06/29/2018 9:00 AM Medical Record Number: [id number] Patient Account Number: [id number] Date of Birth/Sex: [birth year] (85 y.o. M) Treating RN: Montey Hora Primary Care Sigfredo Schreier: [name] Other Clinician: Referring Dirck Butch: [name] Treating Thuy Atilano/Extender: STONE III, HOYT Weeks in Treatment: 1 Vital Signs Height(in): 65 Pulse(bpm): 73 Weight(lbs): 175 Blood Pressure(mmHg): 138/69 Body Mass Index(BMI): 29 Temperature(F): 97.6 Respiratory Rate 18 (breaths/min): Photos: [N/A:N/A] Wound Location: Right Forearm N/A N/A Wounding Event: Trauma N/A N/A Primary Etiology: Trauma, Other N/A N/A Comorbid History: Cataracts, Congestive Heart N/A N/A Failure, Hypertension, Rheumatoid Arthritis, Received Radiation Date Acquired: 06/01/2018 N/A N/A Weeks of Treatment: 1 N/A N/A Wound Status: Open N/A N/A Measurements L x W x D 4x3.3x0.4 N/A N/A (cm) Area (cm)  : 10.367 N/A N/A Volume (cm) : 4.147 N/A N/A % Reduction in Area: 24.60% N/A N/A % Reduction in Volume: 49.70% N/A N/A Classification: Partial Thickness N/A N/A Exudate Amount: Large N/A N/A Exudate Type: Sanguinous N/A N/A Exudate Color: red N/A N/A Wound Margin: Flat and Intact N/A N/A Granulation Amount: Small (1-33%) N/A N/A Granulation Quality: Red N/A N/A Necrotic Amount: Medium (34-66%) N/A N/A Necrotic Tissue: Eschar, Adherent Slough N/A N/A Exposed Structures: Fat Layer (Subcutaneous N/A N/A Tissue) Exposed: Yes Fascia: No Tendon: No Evan [name], Evan [name] ([id number]) Muscle: No Joint: No Bone: No Periwound Skin Texture: Excoriation: No  N/A N/A Induration: No Callus: No Crepitus: No Rash: No Scarring: No Periwound Skin Moisture: Maceration: No N/A N/A Dry/Scaly: No Periwound Skin Color: Atrophie Blanche: No N/A N/A Cyanosis: No Ecchymosis: No Erythema: No Hemosiderin Staining: No Mottled: No Pallor: No Rubor: No Tenderness on Palpation: Yes N/A N/A Wound Preparation: Ulcer Cleansing: N/A N/A Rinsed/Irrigated with Saline Topical Anesthetic Applied: Other: lidocaine 4% Treatment Notes Electronic Signature(s) Signed: 06/29/2018 4:37:01 PM By: Montey Hora Entered By: Montey Hora on 06/29/2018 09:51:08 Evan [name] ([id number]) -------------------------------------------------------------------------------- [place] Details Patient Name: Evan [name] Date of Service: 06/29/2018 9:00 AM Medical Record Number: [id number] Patient Account Number: [id number] Date of Birth/Sex: 08/20/1927 (85 y.o. M) Treating RN: Montey Hora Primary Care Kaelie Henigan: [name] Other Clinician: Referring Sevin Langenbach: [name] Treating Aqeel Norgaard/Extender: Melburn Hake, HOYT Weeks in Treatment: 1 Active Inactive Abuse / Safety / Falls / Self Care Management Nursing Diagnoses: Potential for falls Goals: Patient will not experience any injury  related to falls Date Initiated: 06/22/2018 Target Resolution Date: 09/08/2018 Goal Status: Active Interventions: Assess fall risk on admission and as needed Notes: Necrotic Tissue Nursing Diagnoses: Impaired tissue integrity related to necrotic/devitalized tissue Goals: Patient/caregiver will verbalize understanding of reason and process for debridement of necrotic tissue Date Initiated: 06/22/2018 Target Resolution Date: 09/08/2018 Goal Status: Active Interventions: Provide education on necrotic tissue and debridement process Notes: Orientation to the Wound Care Program Nursing Diagnoses: Knowledge deficit related to the wound healing center program Goals: Patient/caregiver will verbalize understanding of the [facility] Program Date Initiated: 06/22/2018 Target Resolution Date: 09/08/2018 Goal Status: Active Interventions: Provide education on orientation to the wound center [place] ([id number]) Notes: Wound/Skin Impairment Nursing Diagnoses: Impaired tissue integrity Goals: Ulcer/skin breakdown will heal within 14 weeks Date Initiated: 06/22/2018 Target Resolution Date: 09/08/2018 Goal Status: Active Interventions: Assess patient/caregiver ability to obtain necessary supplies Assess patient/caregiver ability to perform ulcer/skin care regimen upon admission and as needed Assess ulceration(s) every visit Notes: Electronic Signature(s) Signed: 06/29/2018 4:37:01 PM By: Montey Hora Entered By: Montey Hora on 06/29/2018 09:51:01 Evan [name] ([id number]) -------------------------------------------------------------------------------- Pain Assessment Details Patient Name: Evan [name] Date of Service: 06/29/2018 9:00 AM Medical Record Number: [id number] Patient Account Number: [id number] Date of Birth/Sex: 1927-10-26 (85 y.o. M) Treating RN: Harold Barban Primary Care Keno Caraway: [name] Other Clinician: Referring Gerrit Rafalski: [name] Treating Merrin Mcvicker/Extender: Melburn Hake, HOYT Weeks in Treatment: 1 Active Problems Location of Pain Severity and Description of Pain Patient Has Paino Yes Site Locations Rate the pain. Current Pain Level: 2 Pain Management and Medication Current Pain Management: Electronic Signature(s) Signed: 06/29/2018 3:52:12 PM By: Harold Barban Entered By: Harold Barban on 06/29/2018 09:21:07 Evan [name] ([id number]) -------------------------------------------------------------------------------- Patient/Caregiver Education Details Patient Name: Evan [name] Date of Service: 06/29/2018 9:00 AM Medical Record Number: [id number] Patient Account Number: [id number] Date of Birth/Gender: 1927-09-30 (85 y.o. M) Treating RN: Montey Hora Primary Care Physician: [name] Other Clinician: Referring Physician: [name] Treating Physician/Extender: Sharalyn Ink in Treatment: 1 Education Assessment Education Provided To: Patient Education Topics Provided Wound/Skin Impairment: Handouts: Other: wound care as ordered Methods: Demonstration, Explain/Verbal Responses: State content correctly Electronic Signature(s) Signed: 06/29/2018 4:37:01 PM By: Montey Hora Entered By: Montey Hora on 06/29/2018 09:55:05 Evan [name] ([id number]) -------------------------------------------------------------------------------- Wound Assessment Details Patient Name: Evan [name] Date of Service: 06/29/2018 9:00 AM Medical Record Number: [id number] Patient Account Number: [id number] Date of Birth/Sex: [birth year] (85 y.o. M) Treating RN: Harold Barban Primary Care Karyss Frese: [name] Other  Clinician: Referring Valerie Fredin: [name] Treating Timisha Mondry/Extender: Melburn Hake, HOYT Weeks in Treatment: 1 Wound Status Wound Number: 1 Primary Trauma, Other Etiology: Wound Location: Right Forearm Wound Open Wounding Event: Trauma Status: Date Acquired:  06/01/2018 Comorbid Cataracts, Congestive Heart Failure, Weeks Of Treatment: 1 History: Hypertension, Rheumatoid Arthritis, Received Clustered Wound: No Radiation Photos Wound Measurements Length: (cm) 4 % Reduction i Width: (cm) 3.3 % Reduction i Depth: (cm) 0.4 Tunneling: Area: (cm) 10.367 Undermining: Volume: (cm) 4.147 n Area: 24.6% n Volume: 49.7% No No Wound Description Classification: Partial Thickness Foul Odor Af Wound Margin: Flat and Intact Slough/Fibri Exudate Amount: Large Exudate Type: Sanguinous Exudate Color: red ter Cleansing: No no No Wound Bed Granulation Amount: Small (1-33%) Exposed Structure Granulation Quality: Red Fascia Exposed: No Necrotic Amount: Medium (34-66%) Fat Layer (Subcutaneous Tissue) Exposed: Yes Necrotic Quality: Eschar, Adherent Slough Tendon Exposed: No Muscle Exposed: No Joint Exposed: No Bone Exposed: No Periwound Skin Texture Texture Color Evan [name], Evan [name] ([id number]) No Abnormalities Noted: No No Abnormalities Noted: No Callus: No Atrophie Blanche: No Crepitus: No Cyanosis: No Excoriation: No Ecchymosis: No Induration: No Erythema: No Rash: No Hemosiderin Staining: No Scarring: No Mottled: No Pallor: No Moisture Rubor: No No Abnormalities Noted: No Dry / Scaly: No Temperature / Pain Maceration: No Tenderness on Palpation: Yes Wound Preparation Ulcer Cleansing: Rinsed/Irrigated with Saline Topical Anesthetic Applied: Other: lidocaine 4%, Treatment Notes Wound #1 (Right Forearm) Notes santyl, wet to dry gauze, ABD, coban Electronic Signature(s) Signed: 06/29/2018 3:52:12 PM By: Harold Barban Entered By: Harold Barban on 06/29/2018 09:26:42 Evan [name] ([id number]) -------------------------------------------------------------------------------- Vitals Details Patient Name: Evan [name] Date of Service: 06/29/2018 9:00 AM Medical Record Number: [id number] Patient Account Number: [id number] Date  of Birth/Sex: [birth year] (85 y.o. M) Treating RN: Harold Barban Primary Care Orrie Schubert: [name] Other Clinician: Referring Glennette Galster: [name] Treating Aleila Syverson/Extender: Melburn Hake, HOYT Weeks in Treatment: 1 Vital Signs Time Taken: 09:20 Temperature (F): 97.6 Height (in): 65 Pulse (bpm): 73 Weight (lbs): 175 Respiratory Rate (breaths/min): 18 Body Mass Index (BMI): 29.1 Blood Pressure (mmHg): 138/69 Reference Range: 80 - 120 mg / dl Electronic Signature(s) Signed: 06/29/2018 3:52:12 PM By: Harold Barban Entered By: Harold Barban on 06/29/2018 09:21:32

## 2018-07-05 DIAGNOSIS — C3490 Malignant neoplasm of unspecified part of unspecified bronchus or lung: Secondary | ICD-10-CM | POA: Diagnosis not present

## 2018-07-05 DIAGNOSIS — I482 Chronic atrial fibrillation, unspecified: Secondary | ICD-10-CM | POA: Diagnosis not present

## 2018-07-05 DIAGNOSIS — S40021D Contusion of right upper arm, subsequent encounter: Secondary | ICD-10-CM | POA: Diagnosis not present

## 2018-07-05 DIAGNOSIS — I2699 Other pulmonary embolism without acute cor pulmonale: Secondary | ICD-10-CM | POA: Diagnosis not present

## 2018-07-05 DIAGNOSIS — I110 Hypertensive heart disease with heart failure: Secondary | ICD-10-CM | POA: Diagnosis not present

## 2018-07-05 DIAGNOSIS — L03113 Cellulitis of right upper limb: Secondary | ICD-10-CM | POA: Diagnosis not present

## 2018-07-05 DIAGNOSIS — R21 Rash and other nonspecific skin eruption: Secondary | ICD-10-CM | POA: Diagnosis not present

## 2018-07-06 ENCOUNTER — Encounter: Payer: Medicare HMO | Attending: Physician Assistant | Admitting: Physician Assistant

## 2018-07-06 ENCOUNTER — Other Ambulatory Visit: Payer: Self-pay

## 2018-07-06 DIAGNOSIS — Z85118 Personal history of other malignant neoplasm of bronchus and lung: Secondary | ICD-10-CM | POA: Insufficient documentation

## 2018-07-06 DIAGNOSIS — Z8249 Family history of ischemic heart disease and other diseases of the circulatory system: Secondary | ICD-10-CM | POA: Insufficient documentation

## 2018-07-06 DIAGNOSIS — S51809A Unspecified open wound of unspecified forearm, initial encounter: Secondary | ICD-10-CM | POA: Diagnosis not present

## 2018-07-06 DIAGNOSIS — M069 Rheumatoid arthritis, unspecified: Secondary | ICD-10-CM | POA: Insufficient documentation

## 2018-07-06 DIAGNOSIS — I5022 Chronic systolic (congestive) heart failure: Secondary | ICD-10-CM | POA: Insufficient documentation

## 2018-07-06 DIAGNOSIS — I110 Hypertensive heart disease with heart failure: Secondary | ICD-10-CM | POA: Diagnosis not present

## 2018-07-06 DIAGNOSIS — Z7901 Long term (current) use of anticoagulants: Secondary | ICD-10-CM | POA: Diagnosis not present

## 2018-07-06 DIAGNOSIS — L98492 Non-pressure 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 fat layer exposed: Secondary | ICD-10-CM | POA: Insufficient documentation

## 2018-07-06 NOTE — Progress Notes (Signed)
Evan [name] ([id number]) Visit Report for 07/06/2018 Chief Complaint Document Details Patient Name: Evan [name] Date of Service: 07/06/2018 9:00 AM Medical Record Number: [id number] Patient Account Number: [id number] Date of Birth/Sex: September 23, 1927 (85 y.o. M) Treating RN: Montey Hora Primary Care Provider: [name] Other Clinician: Referring Provider: [name] Treating Provider/Extender: Melburn Hake, HOYT Weeks in Treatment: 2 Information Obtained from: Patient Chief Complaint Right forearm skin tear Electronic Signature(s) Signed: 07/06/2018 3:18:49 PM By: Worthy Keeler PA-C Entered By: Worthy Keeler on 07/06/2018 09:26:55 Evan [name] ([id number]) -------------------------------------------------------------------------------- HPI Details Patient Name: Evan [name] Date of Service: 07/06/2018 9:00 AM Medical Record Number: [id number] Patient Account Number: [id number] Date of Birth/Sex: 07/20/1927 (85 y.o. M) Treating RN: Montey Hora Primary Care Provider: [name] Other Clinician: Referring Provider: [name] Treating Provider/Extender: Melburn Hake, HOYT Weeks in Treatment: 2 History of Present Illness HPI Description: 06/22/18 on evaluation today patient presents for initial inspection here in our clinic concerning an ulcer on the right dorsal forearm. The exact timing of this is somewhat ambiguous although it appears to be possibly around one month from on set. I am told by the patient's daughter was present during the evaluation today with him that he had an area which was itching on the right forearm and subsequently scratched it because in an open wound that led to a hematoma that had to be drained twice when he went to the ER. That was around 06/08/18. Subsequently that was not really effective and he ended up with a congealed hematoma under the area which has since opened. He has been seen by his primary care provider as well and placed on  Keflex which he is currently taking. The patient is on Lovenox twice a day 80 mg. He apparently fell treatment with eloquence he still developed multiple blood clots even on the Eliquis. Therefore he does have issues with bleeding in general. With that being said the patient does have a history of hypertension congestive heart failure, long-term use of anticoagulants, and a personal history of lung cancer which is currently in remission. He has never had any skin cancer issues that they are aware of. He does have a consult which was placed by his primary care provider to the [facility] in [place] although he has not been for that as of yet in fact they do not even have an appointment time. Fortunately there's no evidence of active infection. No fevers, chills, nausea, or vomiting noted at this time. 06/29/18 on evaluation today patient's wound actually appears to be doing significantly better which is great news. He actually did get the [facility] field and has been using that. Subsequently he also has a negative pathology report the show granulation chronic inflammation but no evidence of a skin cancer. This is also great news. Overall very pleased everything seems to be doing much better compared to the previous evaluation. The patient is glad to hear this as well. 07/06/18 on evaluation today patient's right form actually appears to be doing excellent at this point in regard to his overall appearance of the wound bed. Fortunately there does not appear to be any evidence of infection at this time which is good news. No fevers, chills, nausea, or vomiting noted at this time. Electronic Signature(s) Signed: 07/06/2018 3:18:49 PM By: Worthy Keeler PA-C Entered By: Worthy Keeler on 07/06/2018 09:35:16 Evan [name] ([id number]) -------------------------------------------------------------------------------- Physical Exam Details Patient Name: Evan [name] Date of Service: 07/06/2018 9:00  AM  Medical Record Number: [id number] Patient Account Number: [id number] Date of Birth/Sex: 10-Aug-1927 (85 y.o. M) Treating RN: Montey Hora Primary Care Provider: [name] Other Clinician: Referring Provider: [name] Treating Provider/Extender: STONE III, HOYT Weeks in Treatment: 2 Constitutional Well-nourished and well-hydrated in no acute distress. Respiratory normal breathing without difficulty. clear to auscultation bilaterally. Cardiovascular regular rate and rhythm with normal S1, S2. Psychiatric this patient is able to make decisions and demonstrates good insight into disease process. Alert and Oriented x 3. pleasant and cooperative. Notes Patient's wound currently shown signs of excellent granulation there does not appear to be any significant slough buildup which is good news as well. Overall very pleased with how things seem to be progressing at this time no sharp debridement was necessary. Electronic Signature(s) Signed: 07/06/2018 3:18:49 PM By: Worthy Keeler PA-C Entered By: Worthy Keeler on 07/06/2018 09:35:43 Evan [name] ([id number]) -------------------------------------------------------------------------------- Physician Orders Details Patient Name: Evan [name] Date of Service: 07/06/2018 9:00 AM Medical Record Number: [id number] Patient Account Number: [id number] Date of Birth/Sex: May 02, 1927 (85 y.o. M) Treating RN: Montey Hora Primary Care Provider: [name] Other Clinician: Referring Provider: [name] Treating Provider/Extender: Melburn Hake, HOYT Weeks in Treatment: 2 Verbal / Phone Orders: No Diagnosis Coding ICD-10 Coding Code Description S51.801A Unspecified open wound of right forearm, initial encounter L98.492 Non-pressure 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 fat layer exposed I10 Essential (primary) hypertension Z79.01 Long term (current) use of anticoagulants X90.24 Chronic systolic (congestive) heart  failure Z85.118 Personal history of other malignant neoplasm of bronchus and lung Wound Cleansing Wound #1 Right Forearm o Clean wound with Normal Saline. o May Shower, gently pat wound dry prior to applying new dressing. Anesthetic (add to Medication List) Wound #1 Right Forearm o Topical Lidocaine 4% cream applied to wound bed prior to debridement (In Clinic Only). o Injected 2% Xylocaine MPF prior to debridement (In Clinic Only). Primary Wound Dressing Wound #1 Right Forearm o Silver Collagen Secondary Dressing Wound #1 Right Forearm o Gauze, ABD and Kerlix/Conform - secure lightly with coban Dressing Change Frequency Wound #1 Right Forearm o Change dressing every other day. Follow-up Appointments Wound #1 Right Forearm o Return Appointment in 1 week. Electronic Signature(s) Signed: 07/06/2018 3:08:04 PM By: Montey Hora Signed: 07/06/2018 3:18:49 PM By: Kerby Moors, Denning ([id number]) Entered By: Montey Hora on 07/06/2018 09:30:06 Evan, [name] ([id number]) -------------------------------------------------------------------------------- Problem List Details Patient Name: Evan [name] Date of Service: 07/06/2018 9:00 AM Medical Record Number: [id number] Patient Account Number: [id number] Date of Birth/Sex: 15-Nov-1927 (85 y.o. M) Treating RN: Montey Hora Primary Care Provider: [name] Other Clinician: Referring Provider: [name] Treating Provider/Extender: Melburn Hake, HOYT Weeks in Treatment: 2 Active Problems ICD-10 Evaluated Encounter Code Description Active Date Today Diagnosis S51.801A Unspecified open wound of right forearm, initial encounter 06/22/2018 No Yes L98.492 Non-pressure 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 fat layer 06/22/2018 No Yes exposed [employer] (primary) hypertension 06/22/2018 No Yes Z79.01 Long term (current) use of anticoagulants 06/22/2018 No Yes Q11.94 Chronic systolic (congestive)  heart failure 06/22/2018 No Yes Z85.118 Personal history of other malignant neoplasm of bronchus 06/22/2018 No Yes and lung Inactive Problems Resolved Problems Electronic Signature(s) Signed: 07/06/2018 3:18:49 PM By: Worthy Keeler PA-C Entered By: Worthy Keeler on 07/06/2018 09:26:49 Evan [name] ([id number]) -------------------------------------------------------------------------------- Progress Note Details Patient Name: Evan [name] Date of Service: 07/06/2018 9:00 AM Medical Record Number: [id number] Patient Account Number: [id number] Date of Birth/Sex:  16-Mar-1928 (84 y.o. M) Treating RN: Montey Hora Primary Care Provider: [name] Other Clinician: Referring Provider: [name] Treating Provider/Extender: Melburn Hake, HOYT Weeks in Treatment: 2 Subjective Chief Complaint Information obtained from Patient Right forearm skin tear History of Present Illness (HPI) 06/22/18 on evaluation today patient presents for initial inspection here in our clinic concerning an ulcer on the right dorsal forearm. The exact timing of this is somewhat ambiguous although it appears to be possibly around one month from on set. I am told by the patient's daughter was present during the evaluation today with him that he had an area which was itching on the right forearm and subsequently scratched it because in an open wound that led to a hematoma that had to be drained twice when he went to the ER. That was around 06/08/18. Subsequently that was not really effective and he ended up with a congealed hematoma under the area which has since opened. He has been seen by his primary care provider as well and placed on Keflex which he is currently taking. The patient is on Lovenox twice a day 80 mg. He apparently fell treatment with eloquence he still developed multiple blood clots even on the Eliquis. Therefore he does have issues with bleeding in general. With that being said the patient does have  a history of hypertension congestive heart failure, long-term use of anticoagulants, and a personal history of lung cancer which is currently in remission. He has never had any skin cancer issues that they are aware of. He does have a consult which was placed by his primary care provider to the [facility] in [place] although he has not been for that as of yet in fact they do not even have an appointment time. Fortunately there's no evidence of active infection. No fevers, chills, nausea, or vomiting noted at this time. 06/29/18 on evaluation today patient's wound actually appears to be doing significantly better which is great news. He actually did get the [facility] field and has been using that. Subsequently he also has a negative pathology report the show granulation chronic inflammation but no evidence of a skin cancer. This is also great news. Overall very pleased everything seems to be doing much better compared to the previous evaluation. The patient is glad to hear this as well. 07/06/18 on evaluation today patient's right form actually appears to be doing excellent at this point in regard to his overall appearance of the wound bed. Fortunately there does not appear to be any evidence of infection at this time which is good news. No fevers, chills, nausea, or vomiting noted at this time. Patient History Information obtained from Patient. Family History Diabetes - Siblings, Heart Disease - Mother,Siblings, Hypertension - Child, Thyroid Problems - Child, No family history of Cancer, Hereditary Spherocytosis, Kidney Disease, Lung Disease, Seizures, Stroke, Tuberculosis. Social History Never smoker, Marital Status - Widowed, Alcohol Use - Never, Drug Use - No History, Caffeine Use - Never. Medical History Eyes Patient has history of Cataracts Denies history of Glaucoma, Optic Neuritis Ear/Nose/Mouth/Throat Denies history of Chronic sinus problems/congestion, Middle ear  problems Hematologic/Lymphatic Evan, [name] ([id number]) Denies history of Anemia, Hemophilia, Human Immunodeficiency Virus, Lymphedema, Sickle Cell Disease Respiratory Denies history of Aspiration, Asthma, Chronic Obstructive Pulmonary Disease (COPD), Pneumothorax, Sleep Apnea, Tuberculosis Cardiovascular Patient has history of Congestive Heart Failure - 3 months, Hypertension Denies history of Angina, Arrhythmia, Coronary Artery Disease, Deep Vein Thrombosis, Hypotension, Myocardial Infarction, Peripheral Arterial Disease, Peripheral Venous Disease, Phlebitis, Vasculitis Gastrointestinal  Denies history of Cirrhosis , Colitis, Crohn s, Hepatitis A, Hepatitis B, Hepatitis C Endocrine Denies history of Type I Diabetes, Type II Diabetes Genitourinary Denies history of End Stage Renal Disease Immunological Denies history of Lupus Erythematosus, Raynaud s, Scleroderma Integumentary (Skin) Denies history of History of Burn, History of pressure wounds Musculoskeletal Patient has history of Rheumatoid Arthritis Denies history of Gout, Osteoarthritis, Osteomyelitis Neurologic Denies history of Dementia, Neuropathy, Quadriplegia, Paraplegia, Seizure Disorder Oncologic Patient has history of Received Radiation - lung cancer 2 years Denies history of Received Chemotherapy Psychiatric Denies history of Anorexia/bulimia, Confinement Anxiety Review of Systems (ROS) Constitutional Symptoms (General Health) Denies complaints or symptoms of Fever, Chills. Respiratory The patient has no complaints or symptoms. Cardiovascular The patient has no complaints or symptoms. Psychiatric The patient has no complaints or symptoms. Objective Constitutional Well-nourished and well-hydrated in no acute distress. Vitals Time Taken: 9:16 AM, Height: 65 in, Weight: 175 lbs, BMI: 29.1, Temperature: 97.8 F, Pulse: 68 bpm, Respiratory Rate: 16 breaths/min, Blood Pressure: 113/68 mmHg. Respiratory normal  breathing without difficulty. clear to auscultation bilaterally. Evan, [name] ([id number]) Cardiovascular regular rate and rhythm with normal S1, S2. Psychiatric this patient is able to make decisions and demonstrates good insight into disease process. Alert and Oriented x 3. pleasant and cooperative. General Notes: Patient's wound currently shown signs of excellent granulation there does not appear to be any significant slough buildup which is good news as well. Overall very pleased with how things seem to be progressing at this time no sharp debridement was necessary. Integumentary (Hair, Skin) Wound #1 status is Open. Original cause of wound was Trauma. The wound is located on the Right Forearm. The wound measures 3.4cm length x 2.5cm width x 0.1cm depth; 6.676cm^2 area and 0.668cm^3 volume. There is Fat Layer (Subcutaneous Tissue) Exposed exposed. There is no tunneling or undermining noted. There is a large amount of sanguinous drainage noted. The wound margin is flat and intact. There is large (67-100%) red granulation within the wound bed. There is a small (1-33%) amount of necrotic tissue within the wound bed including Adherent Slough. The periwound skin appearance did not exhibit: Callus, Crepitus, Excoriation, Induration, Rash, Scarring, Dry/Scaly, Maceration, Atrophie Blanche, Cyanosis, Ecchymosis, Hemosiderin Staining, Mottled, Pallor, Rubor, Erythema. The periwound has tenderness on palpation. Assessment Active Problems ICD-10 Unspecified open wound of right forearm, initial encounter Non-pressure 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 fat layer exposed Essential (primary) hypertension Long term (current) use of anticoagulants Chronic systolic (congestive) heart failure Personal history of other malignant neoplasm of bronchus and lung Plan Wound Cleansing: Wound #1 Right Forearm: Clean wound with Normal Saline. May Shower, gently pat wound dry prior to applying new  dressing. Anesthetic (add to Medication List): Wound #1 Right Forearm: Topical Lidocaine 4% cream applied to wound bed prior to debridement (In Clinic Only). Injected 2% Xylocaine MPF prior to debridement (In Clinic Only). Primary Wound Dressing: Wound #1 Right Forearm: Silver Collagen Secondary Dressing: Wound #1 Right Forearm: Gauze, ABD and Kerlix/Conform - secure lightly with Evan, [name] ([id number]) Dressing Change Frequency: Wound #1 Right Forearm: Change dressing every other day. Follow-up Appointments: Wound #1 Right Forearm: Return Appointment in 1 week. My suggestion currently is gonna be that we go ahead and initiate the above wound care measures for the next week. The patient is in agreement with plan as is his daughter who was present during the evaluation today. We will subsequently see were things stand at follow-up. If anything changes worsens meantime  he will contact the office and let me know otherwise my hope is that this will really jumpstart at this point to allow the area to heal more quickly with the collagen. Please see above for specific wound care orders. We will see patient for re-evaluation in 1 week(s) here in the clinic. If anything worsens or changes patient will contact our office for additional recommendations. Electronic Signature(s) Signed: 07/06/2018 3:18:49 PM By: Worthy Keeler PA-C Entered By: Worthy Keeler on 07/06/2018 09:36:11 Evan [name] ([id number]) -------------------------------------------------------------------------------- ROS/PFSH Details Patient Name: Evan [name] Date of Service: 07/06/2018 9:00 AM Medical Record Number: [id number] Patient Account Number: [id number] Date of Birth/Sex: [birth year] (85 y.o. M) Treating RN: Montey Hora Primary Care Provider: [name] Other Clinician: Referring Provider: [name] Treating Provider/Extender: Melburn Hake, HOYT Weeks in Treatment: 2 Information Obtained  From Patient Wound History Do you currently have one or more open woundso Yes How many open wounds do you currently haveo 1 Approximately how long have you had your woundso 3 weeks How have you been treating your wound(s) until nowo pressure dressing Has your wound(s) ever healed and then re-openedo No Have you had any lab work done in the past montho Yes Who ordered the lab work doneo [place] Have you tested positive for an antibiotic resistant organism (MRSA, VRE)o No Have you tested positive for osteomyelitis (bone infection)o No Have you had any tests for circulation on your legso No Constitutional Symptoms (General Health) Complaints and Symptoms: Negative for: Fever; Chills Eyes Medical History: Positive for: Cataracts Negative for: Glaucoma; Optic Neuritis Ear/Nose/Mouth/Throat Medical History: Negative for: Chronic sinus problems/congestion; Middle ear problems Hematologic/Lymphatic Medical History: Negative for: Anemia; Hemophilia; Human Immunodeficiency Virus; Lymphedema; Sickle Cell Disease Respiratory Complaints and Symptoms: No Complaints or Symptoms Medical History: Negative for: Aspiration; Asthma; Chronic Obstructive Pulmonary Disease (COPD); Pneumothorax; Sleep Apnea; Tuberculosis Cardiovascular Complaints and Symptoms: No Complaints or Symptoms Evan, [name] ([id number]) Medical History: Positive for: Congestive Heart Failure - 3 months; Hypertension Negative for: Angina; Arrhythmia; Coronary Artery Disease; Deep Vein Thrombosis; Hypotension; Myocardial Infarction; Peripheral Arterial Disease; Peripheral Venous Disease; Phlebitis; Vasculitis Gastrointestinal Medical History: Negative for: Cirrhosis ; Colitis; Crohnos; Hepatitis A; Hepatitis B; Hepatitis C Endocrine Medical History: Negative for: Type I Diabetes; Type II Diabetes Genitourinary Medical History: Negative for: End Stage Renal Disease Immunological Medical History: Negative for:  Lupus Erythematosus; Raynaudos; Scleroderma Integumentary (Skin) Medical History: Negative for: History of Burn; History of pressure wounds Musculoskeletal Medical History: Positive for: Rheumatoid Arthritis Negative for: Gout; Osteoarthritis; Osteomyelitis Neurologic Medical History: Negative for: Dementia; Neuropathy; Quadriplegia; Paraplegia; Seizure Disorder Oncologic Medical History: Positive for: Received Radiation - lung cancer 2 years Negative for: Received Chemotherapy Psychiatric Complaints and Symptoms: No Complaints or Symptoms Medical History: Negative for: Anorexia/bulimia; Confinement Anxiety HBO Extended History Items Eyes: Cataracts Evan, [name] ([id number]) Immunizations Pneumococcal Vaccine: Received Pneumococcal Vaccination: Yes Implantable Devices None Family and Social History Cancer: No; Diabetes: Yes - Siblings; Heart Disease: Yes - Mother,Siblings; Hereditary Spherocytosis: No; Hypertension: Yes - Child; Kidney Disease: No; Lung Disease: No; Seizures: No; Stroke: No; Thyroid Problems: Yes - Child; Tuberculosis: No; Never smoker; Marital Status - Widowed; Alcohol Use: Never; Drug Use: No History; Caffeine Use: Never; Financial Concerns: No; Food, Clothing or Shelter Needs: No; Support System Lacking: No; Transportation Concerns: No; Advanced Directives: No; Patient does not want information on Advanced Directives; Do not resuscitate: No; Living Will: No; Medical Power of Attorney: No Physician Affirmation I have reviewed and agree with the above  information. Electronic Signature(s) Signed: 07/06/2018 3:08:04 PM By: Montey Hora Signed: 07/06/2018 3:18:49 PM By: Worthy Keeler PA-C Entered By: Worthy Keeler on 07/06/2018 09:35:29 Evan, [name] ([id number]) -------------------------------------------------------------------------------- [place] Details Patient Name: Evan [name] Date of Service: 07/06/2018 Medical Record Number:  [id number] Patient Account Number: [id number] Date of Birth/Sex: 1927/08/05 (85 y.o. M) Treating RN: Montey Hora Primary Care Provider: [name] Other Clinician: Referring Provider: [name] Treating Provider/Extender: Melburn Hake, HOYT Weeks in Treatment: 2 Diagnosis Coding ICD-10 Codes Code Description S51.801A Unspecified open wound of right forearm, initial encounter L98.492 Non-pressure 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 fat layer exposed I10 Essential (primary) hypertension Z79.01 Long term (current) use of anticoagulants L89.37 Chronic systolic (congestive) heart failure Z85.118 Personal history of other malignant neoplasm of bronchus and lung Facility Procedures CPT4 Code: [id number] Description: 99213 - WOUND CARE VISIT-LEV 3 EST PT Modifier: Quantity: 1 Physician Procedures CPT4 Code: [id number] Description: 35597 - WC PHYS LEVEL 4 - EST PT ICD-10 Diagnosis Description S51.801A Unspecified open wound of right forearm, initial encounter L98.492 Non-pressure 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 fat l I10 Essential (primary) hypertension  Z79.01 Long term (current) use of anticoagulants Modifier: ayer exposed Quantity: 1 Electronic Signature(s) Signed: 07/06/2018 3:18:49 PM By: Worthy Keeler PA-C Entered By: Worthy Keeler on 07/06/2018 09:36:23

## 2018-07-06 NOTE — Progress Notes (Signed)
TERRILL, ALPERIN ([id number]) Visit Report for 07/06/2018 Arrival Information Details Patient Name: VALGENE, DELOATCH Date of Service: 07/06/2018 9:00 AM Medical Record Number: [id number] Patient Account Number: [id number] Date of Birth/Sex: December 25, 1927 (85 y.o. M) Treating RN: Montey Hora Primary Care Teddy Rebstock: [name] Other Clinician: Referring Viki Carrera: [name] Treating Dailee Manalang/Extender: Melburn Hake, HOYT Weeks in Treatment: 2 Visit Information History Since Last Visit Added or deleted any medications: No Patient Arrived: Ambulatory Any new allergies or adverse reactions: No Arrival Time: 09:15 Had a fall or experienced change in No Accompanied By: daughter activities of daily living that may affect Transfer Assistance: None risk of falls: Patient Identification Verified: Yes Signs or symptoms of abuse/neglect since last visito No Secondary Verification Process Completed: Yes Hospitalized since last visit: No Implantable device outside of the clinic excluding No cellular tissue based products placed in the center since last visit: Has Dressing in Place as Prescribed: Yes Pain Present Now: No Electronic Signature(s) Signed: 07/06/2018 3:08:04 PM By: Montey Hora Entered By: Montey Hora on 07/06/2018 09:15:48 [name] ([id number]) -------------------------------------------------------------------------------- Clinic Level of Care Assessment Details Patient Name: [name] Date of Service: 07/06/2018 9:00 AM Medical Record Number: [id number] Patient Account Number: [id number] Date of Birth/Sex: [birth year] (85 y.o. M) Treating RN: Montey Hora Primary Care [place]: [name] Other Clinician: Referring Colyn Miron: [name] Treating Angelyne Terwilliger/Extender: Melburn Hake, HOYT Weeks in Treatment: 2 Clinic Level of Care Assessment Items TOOL 4 Quantity Score []  - Use when only an EandM is performed on FOLLOW-UP visit 0 ASSESSMENTS - Nursing Assessment /  Reassessment X - Reassessment of Co-morbidities (includes updates in patient status) 1 10 X- 1 5 Reassessment of Adherence to Treatment Plan ASSESSMENTS - Wound and Skin Assessment / Reassessment X - Simple Wound Assessment / Reassessment - one wound 1 5 []  - 0 Complex Wound Assessment / Reassessment - multiple wounds []  - 0 Dermatologic / Skin Assessment (not related to wound area) ASSESSMENTS - Focused Assessment []  - Circumferential Edema Measurements - multi extremities 0 []  - 0 Nutritional Assessment / Counseling / Intervention []  - 0 Lower Extremity Assessment (monofilament, tuning fork, pulses) []  - 0 Peripheral Arterial Disease Assessment (using hand held doppler) ASSESSMENTS - Ostomy and/or Continence Assessment and Care []  - Incontinence Assessment and Management 0 []  - 0 Ostomy Care Assessment and Management (repouching, etc.) PROCESS - Coordination of Care X - Simple Patient / Family Education for ongoing care 1 15 []  - 0 Complex (extensive) Patient / Family Education for ongoing care X- 1 10 Staff obtains Programmer, systems, Records, Test Results / Process Orders []  - 0 Staff telephones HHA, Nursing Homes / Clarify orders / etc []  - 0 Routine Transfer to another Facility (non-emergent condition) []  - 0 Routine Hospital Admission (non-emergent condition) []  - 0 New Admissions / Biomedical engineer / Ordering NPWT, Apligraf, etc. []  - 0 Emergency Hospital Admission (emergent condition) X- 1 10 Simple Discharge Coordination WESLIE, PRETLOW ([id number]) []  - 0 Complex (extensive) Discharge Coordination PROCESS - Special Needs []  - Pediatric / Minor Patient Management 0 []  - 0 Isolation Patient Management []  - 0 Hearing / Language / Visual special needs []  - 0 Assessment of Community assistance (transportation, D/C planning, etc.) []  - 0 Additional assistance / Altered mentation []  - 0 Support Surface(s) Assessment (bed, cushion, seat, etc.) INTERVENTIONS -  Wound Cleansing / Measurement X - Simple Wound Cleansing - one wound 1 5 []  - 0 Complex Wound Cleansing - multiple wounds X- 1 5 Wound Imaging (  photographs - any number of wounds) []  - 0 Wound Tracing (instead of photographs) X- 1 5 Simple Wound Measurement - one wound []  - 0 Complex Wound Measurement - multiple wounds INTERVENTIONS - Wound Dressings X - Small Wound Dressing one or multiple wounds 1 10 []  - 0 Medium Wound Dressing one or multiple wounds []  - 0 Large Wound Dressing one or multiple wounds []  - 0 Application of Medications - topical []  - 0 Application of Medications - injection INTERVENTIONS - Miscellaneous []  - External ear exam 0 []  - 0 Specimen Collection (cultures, biopsies, blood, body fluids, etc.) []  - 0 Specimen(s) / Culture(s) sent or taken to Lab for analysis []  - 0 Patient Transfer (multiple staff / Civil Service fast streamer / Similar devices) []  - 0 Simple Staple / Suture removal (25 or less) []  - 0 Complex Staple / Suture removal (26 or more) []  - 0 Hypo / Hyperglycemic Management (close monitor of Blood Glucose) []  - 0 Ankle / Brachial Index (ABI) - do not check if billed separately X- 1 5 Vital Signs ERIVERTO, BYRNES ([id number]) Has the patient been seen at the hospital within the last three years: Yes Total Score: 85 Level Of Care: New/Established - Level 3 Electronic Signature(s) Signed: 07/06/2018 3:08:04 PM By: Montey Hora Entered By: Montey Hora on 07/06/2018 09:31:25 [name] ([id number]) -------------------------------------------------------------------------------- Encounter Discharge Information Details Patient Name: [name] Date of Service: 07/06/2018 9:00 AM Medical Record Number: [id number] Patient Account Number: [id number] Date of Birth/Sex: 12-26-1927 (85 y.o. M) Treating RN: Montey Hora Primary Care Georgina Krist: [name] Other Clinician: Referring Ory Elting: [name] Treating Rynell Ciotti/Extender: Melburn Hake,  HOYT Weeks in Treatment: 2 Encounter Discharge Information Items Discharge Condition: Stable Ambulatory Status: Ambulatory Discharge Destination: Home Transportation: Private Auto Accompanied By: daughter Schedule Follow-up Appointment: Yes Clinical Summary of Care: Electronic Signature(s) Signed: 07/06/2018 3:08:04 PM By: Montey Hora Entered By: Montey Hora on 07/06/2018 09:32:02 [name] ([id number]) -------------------------------------------------------------------------------- Lower Extremity Assessment Details Patient Name: [name] Date of Service: 07/06/2018 9:00 AM Medical Record Number: [id number] Patient Account Number: [id number] Date of Birth/Sex: 1927/11/18 (85 y.o. M) Treating RN: Montey Hora Primary Care Riyanshi Wahab: [name] Other Clinician: Referring Meisha Salone: [name] Treating Dylen Mcelhannon/Extender: Melburn Hake, HOYT Weeks in Treatment: 2 Electronic Signature(s) Signed: 07/06/2018 3:08:04 PM By: Montey Hora Entered By: Montey Hora on 07/06/2018 09:18:49 [name] ([id number]) -------------------------------------------------------------------------------- Multi Wound Chart Details Patient Name: [name] Date of Service: 07/06/2018 9:00 AM Medical Record Number: [id number] Patient Account Number: [id number] Date of Birth/Sex: February 24, 1928 (85 y.o. M) Treating RN: Montey Hora Primary Care Brandy Kabat: [name] Other Clinician: Referring Juddson Cobern: [name] Treating Audryna Wendt/Extender: STONE III, HOYT Weeks in Treatment: 2 Vital Signs Height(in): 65 Pulse(bpm): 68 Weight(lbs): 175 Blood Pressure(mmHg): 113/68 Body Mass Index(BMI): 29 Temperature(F): 97.8 Respiratory Rate 16 (breaths/min): Photos: [N/A:N/A] Wound Location: Right Forearm N/A N/A Wounding Event: Trauma N/A N/A Primary Etiology: Trauma, Other N/A N/A Comorbid History: Cataracts, Congestive Heart N/A N/A Failure, Hypertension, Rheumatoid  Arthritis, Received Radiation Date Acquired: 06/01/2018 N/A N/A Weeks of Treatment: 2 N/A N/A Wound Status: Open N/A N/A Measurements L x W x D 3.4x2.5x0.1 N/A N/A (cm) Area (cm) : 6.676 N/A N/A Volume (cm) : 0.668 N/A N/A % Reduction in Area: 51.40% N/A N/A % Reduction in Volume: 91.90% N/A N/A Classification: Partial Thickness N/A N/A Exudate Amount: Large N/A N/A Exudate Type: Sanguinous N/A N/A Exudate Color: red N/A N/A Wound Margin: Flat and Intact N/A N/A Granulation Amount: Large (  67-100%) N/A N/A Granulation Quality: Red N/A N/A Necrotic Amount: Small (1-33%) N/A N/A Exposed Structures: Fat Layer (Subcutaneous N/A N/A Tissue) Exposed: Yes Fascia: No Tendon: No Muscle: No TREG, DIEMER ([id number]) Joint: No Bone: No Epithelialization: None N/A N/A Periwound Skin Texture: Excoriation: No N/A N/A Induration: No Callus: No Crepitus: No Rash: No Scarring: No Periwound Skin Moisture: Maceration: No N/A N/A Dry/Scaly: No Periwound Skin Color: Atrophie Blanche: No N/A N/A Cyanosis: No Ecchymosis: No Erythema: No Hemosiderin Staining: No Mottled: No Pallor: No Rubor: No Tenderness on Palpation: Yes N/A N/A Wound Preparation: Ulcer Cleansing: N/A N/A Rinsed/Irrigated with Saline Topical Anesthetic Applied: Other: lidocaine 4% Treatment Notes Electronic Signature(s) Signed: 07/06/2018 3:08:04 PM By: Montey Hora Entered By: Montey Hora on 07/06/2018 09:29:06 [name] ([id number]) -------------------------------------------------------------------------------- [place] Details Patient Name: [name] Date of Service: 07/06/2018 9:00 AM Medical Record Number: [id number] Patient Account Number: [id number] Date of Birth/Sex: 14-Jan-1928 (85 y.o. M) Treating RN: Montey Hora Primary Care Lollie Gunner: [name] Other Clinician: Referring Keyonni Percival: [name] Treating Marcus Schwandt/Extender: Melburn Hake, HOYT Weeks in  Treatment: 2 Active Inactive Abuse / Safety / Falls / Self Care Management Nursing Diagnoses: Potential for falls Goals: Patient will not experience any injury related to falls Date Initiated: 06/22/2018 Target Resolution Date: 09/08/2018 Goal Status: Active Interventions: Assess fall risk on admission and as needed Notes: Necrotic Tissue Nursing Diagnoses: Impaired tissue integrity related to necrotic/devitalized tissue Goals: Patient/caregiver will verbalize understanding of reason and process for debridement of necrotic tissue Date Initiated: 06/22/2018 Target Resolution Date: 09/08/2018 Goal Status: Active Interventions: Provide education on necrotic tissue and debridement process Notes: Orientation to the Wound Care Program Nursing Diagnoses: Knowledge deficit related to the wound healing center program Goals: Patient/caregiver will verbalize understanding of the [place] Program Date Initiated: 06/22/2018 Target Resolution Date: 09/08/2018 Goal Status: Active Interventions: Provide education on orientation to the wound center [place] ([id number]) Notes: Wound/Skin Impairment Nursing Diagnoses: Impaired tissue integrity Goals: Ulcer/skin breakdown will heal within 14 weeks Date Initiated: 06/22/2018 Target Resolution Date: 09/08/2018 Goal Status: Active Interventions: Assess patient/caregiver ability to obtain necessary supplies Assess patient/caregiver ability to perform ulcer/skin care regimen upon admission and as needed Assess ulceration(s) every visit Notes: Electronic Signature(s) Signed: 07/06/2018 3:08:04 PM By: Montey Hora Entered By: Montey Hora on 07/06/2018 09:29:00 [name] ([id number]) -------------------------------------------------------------------------------- Pain Assessment Details Patient Name: [name] Date of Service: 07/06/2018 9:00 AM Medical Record Number: [id number] Patient Account Number: [id number] Date of  Birth/Sex: October 13, 1927 (85 y.o. M) Treating RN: Montey Hora Primary Care Amanuel Sinkfield: [name] Other Clinician: Referring Damarion Mendizabal: [name] Treating Darius Fillingim/Extender: Melburn Hake, HOYT Weeks in Treatment: 2 Active Problems Location of Pain Severity and Description of Pain Patient Has Paino No Site Locations Pain Management and Medication Current Pain Management: Electronic Signature(s) Signed: 07/06/2018 3:08:04 PM By: Montey Hora Entered By: Montey Hora on 07/06/2018 09:16:32 [name] ([id number]) -------------------------------------------------------------------------------- Patient/Caregiver Education Details Patient Name: [name] Date of Service: 07/06/2018 9:00 AM Medical Record Number: [id number] Patient Account Number: [id number] Date of Birth/Gender: 01/19/1928 (85 y.o. M) Treating RN: Montey Hora Primary Care Physician: [name] Other Clinician: Referring Physician: [name] Treating Physician/Extender: Sharalyn Ink in Treatment: 2 Education Assessment Education Provided To: Patient and Caregiver Education Topics Provided Wound/Skin Impairment: Handouts: Other: wound care as ordered including showering Methods: Demonstration, Explain/Verbal Responses: State content correctly Electronic Signature(s) Signed: 07/06/2018 3:08:04 PM By: Montey Hora Entered By: Montey Hora on 07/06/2018 09:32:29  KIT, MOLLETT ([id number]) -------------------------------------------------------------------------------- Wound Assessment Details Patient Name: CHAZE, HRUSKA Date of Service: 07/06/2018 9:00 AM Medical Record Number: [id number] Patient Account Number: [id number] Date of Birth/Sex: [birth year] (85 y.o. M) Treating RN: Montey Hora Primary Care Jaselyn Nahm: [name] Other Clinician: Referring Oprah Camarena: [name] Treating Texie Tupou/Extender: Melburn Hake, HOYT Weeks in Treatment: 2 Wound Status Wound Number: 1  Primary Trauma, Other Etiology: Wound Location: Right Forearm Wound Open Wounding Event: Trauma Status: Date Acquired: 06/01/2018 Comorbid Cataracts, Congestive Heart Failure, Weeks Of Treatment: 2 History: Hypertension, Rheumatoid Arthritis, Received Clustered Wound: No Radiation Photos Wound Measurements Length: (cm) 3.4 % Reduction in Width: (cm) 2.5 % Reduction in Depth: (cm) 0.1 Epithelializati Area: (cm) 6.676 Tunneling: Volume: (cm) 0.668 Undermining: Area: 51.4% Volume: 91.9% on: None No No Wound Description Classification: Partial Thickness Foul Odor After Wound Margin: Flat and Intact Slough/Fibrino Exudate Amount: Large Exudate Type: Sanguinous Exudate Color: red Cleansing: No No Wound Bed Granulation Amount: Large (67-100%) Exposed Structure Granulation Quality: Red Fascia Exposed: No Necrotic Amount: Small (1-33%) Fat Layer (Subcutaneous Tissue) Exposed: Yes Necrotic Quality: Adherent Slough Tendon Exposed: No Muscle Exposed: No Joint Exposed: No Bone Exposed: No Periwound Skin Texture Texture Color MARIN, MILLEY ([id number]) No Abnormalities Noted: No No Abnormalities Noted: No Callus: No Atrophie Blanche: No Crepitus: No Cyanosis: No Excoriation: No Ecchymosis: No Induration: No Erythema: No Rash: No Hemosiderin Staining: No Scarring: No Mottled: No Pallor: No Moisture Rubor: No No Abnormalities Noted: No Dry / Scaly: No Temperature / Pain Maceration: No Tenderness on Palpation: Yes Wound Preparation Ulcer Cleansing: Rinsed/Irrigated with Saline Topical Anesthetic Applied: Other: lidocaine 4%, Treatment Notes Wound #1 (Right Forearm) Notes prisma, ABD, coban Electronic Signature(s) Signed: 07/06/2018 3:08:04 PM By: Montey Hora Entered By: Montey Hora on 07/06/2018 09:24:25 [name] ([id number]) -------------------------------------------------------------------------------- [place] Details Patient Name:  [name] Date of Service: 07/06/2018 9:00 AM Medical Record Number: [id number] Patient Account Number: [id number] Date of Birth/Sex: Aug 28, 1927 (85 y.o. M) Treating RN: Montey Hora Primary Care Hetvi Shawhan: [name] Other Clinician: Referring Johnnye Sandford: [name] Treating Mckinley Adelstein/Extender: Melburn Hake, HOYT Weeks in Treatment: 2 Vital Signs Time Taken: 09:16 Temperature (F): 97.8 Height (in): 65 Pulse (bpm): 68 Weight (lbs): 175 Respiratory Rate (breaths/min): 16 Body Mass Index (BMI): 29.1 Blood Pressure (mmHg): 113/68 Reference Range: 80 - 120 mg / dl Electronic Signature(s) Signed: 07/06/2018 3:08:04 PM By: Montey Hora Entered By: Montey Hora on 07/06/2018 09:18:41

## 2018-07-12 ENCOUNTER — Telehealth: Payer: Self-pay | Admitting: Family Medicine

## 2018-07-12 ENCOUNTER — Other Ambulatory Visit: Payer: Self-pay | Admitting: Family Medicine

## 2018-07-12 MED ORDER — PREDNISONE 20 MG PO TABS: ORAL_TABLET | ORAL | 0 refills | Status: DC

## 2018-07-12 MED ORDER — CEPHALEXIN 500 MG PO CAPS: 500.0000 mg | ORAL_CAPSULE | Freq: Four times a day (QID) | ORAL | 0 refills | Status: DC

## 2018-07-12 MED FILL — CEPHALEXIN 500 MG CAPSULE: 500 | 7 days supply | Qty: 28 | Fill #0

## 2018-07-12 MED FILL — predniSONE 20 MG TABS: 20 | 6 days supply | Qty: 12 | Fill #0

## 2018-07-12 NOTE — Telephone Encounter (Signed)
Patient would also like to know if we can also send in Keflex.

## 2018-07-12 NOTE — Telephone Encounter (Signed)
I sent both to [name]

## 2018-07-12 NOTE — Telephone Encounter (Signed)
Spoke with patient and informed him that medications were sent to the pharmacy. Patient verbalized understanding.

## 2018-07-12 NOTE — Telephone Encounter (Signed)
[facility] [address] [place]  PATIENT WOULD LIKE TO KNOW IF HE CAN HAVE REFILL ON HIS PREDNISONE FOR HIS RASH, HE IS ITCHING REALLY BAD, HAS APPT WITH A SPECIALIST IN June, BUT CANNOT [facility] THIS BAD UNTIL THEN

## 2018-07-13 ENCOUNTER — Other Ambulatory Visit: Payer: Self-pay

## 2018-07-13 ENCOUNTER — Encounter: Payer: Medicare HMO | Admitting: Physician Assistant

## 2018-07-13 DIAGNOSIS — Z7901 Long term (current) use of anticoagulants: Secondary | ICD-10-CM | POA: Diagnosis not present

## 2018-07-13 DIAGNOSIS — Z8249 Family history of ischemic heart disease and other diseases of the circulatory system: Secondary | ICD-10-CM | POA: Diagnosis not present

## 2018-07-13 DIAGNOSIS — Z85118 Personal history of other malignant neoplasm of bronchus and lung: Secondary | ICD-10-CM | POA: Diagnosis not present

## 2018-07-13 DIAGNOSIS — I110 Hypertensive heart disease with heart failure: Secondary | ICD-10-CM | POA: Diagnosis not present

## 2018-07-13 DIAGNOSIS — M069 Rheumatoid arthritis, unspecified: Secondary | ICD-10-CM | POA: Diagnosis not present

## 2018-07-13 DIAGNOSIS — I5022 Chronic systolic (congestive) heart failure: Secondary | ICD-10-CM | POA: Diagnosis not present

## 2018-07-13 DIAGNOSIS — L98492 Non-pressure 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 fat layer exposed: Secondary | ICD-10-CM | POA: Diagnosis not present

## 2018-07-13 NOTE — Progress Notes (Signed)
HARTMAN, MINAHAN ([id number]) Visit Report for 07/13/2018 Chief Complaint Document Details Patient Name: Evan [name] Date of Service: 07/13/2018 8:45 AM Medical Record Number: [id number] Patient Account Number: [id number] Date of Birth/Sex: Mar 26, 1928 (85 y.o. M) Treating RN: Montey Hora Primary Care Provider: [name] Other Clinician: Referring Provider: [name] Treating Provider/Extender: Melburn Hake, HOYT Weeks in Treatment: 3 Information Obtained from: Patient Chief Complaint Right forearm skin tear Electronic Signature(s) Signed: 07/13/2018 12:23:18 PM By: Worthy Keeler PA-C Entered By: Worthy Keeler on 07/13/2018 09:04:30 Evan [name] ([id number]) -------------------------------------------------------------------------------- HPI Details Patient Name: Evan [name] Date of Service: 07/13/2018 8:45 AM Medical Record Number: [id number] Patient Account Number: [id number] Date of Birth/Sex: 1927-05-08 (85 y.o. M) Treating RN: Montey Hora Primary Care Provider: [name] Other Clinician: Referring Provider: [name] Treating Provider/Extender: Melburn Hake, HOYT Weeks in Treatment: 3 History of Present Illness HPI Description: 06/22/18 on evaluation today patient presents for initial inspection here in our clinic concerning an ulcer on the right dorsal forearm. The exact timing of this is somewhat ambiguous although it appears to be possibly around one month from on set. I am told by the patient's daughter was present during the evaluation today with him that he had an area which was itching on the right forearm and subsequently scratched it because in an open wound that led to a hematoma that had to be drained twice when he went to the ER. That was around 06/08/18. Subsequently that was not really effective and he ended up with a congealed hematoma under the area which has since opened. He has been seen by his primary care provider as well and placed on  Keflex which he is currently taking. The patient is on Lovenox twice a day 80 mg. He apparently fell treatment with eloquence he still developed multiple blood clots even on the Eliquis. Therefore he does have issues with bleeding in general. With that being said the patient does have a history of hypertension congestive heart failure, long-term use of anticoagulants, and a personal history of lung cancer which is currently in remission. He has never had any skin cancer issues that they are aware of. He does have a consult which was placed by his primary care provider to the [facility] in [place] although he has not been for that as of yet in fact they do not even have an appointment time. Fortunately there's no evidence of active infection. No fevers, chills, nausea, or vomiting noted at this time. 06/29/18 on evaluation today patient's wound actually appears to be doing significantly better which is great news. He actually did get the [facility] field and has been using that. Subsequently he also has a negative pathology report the show granulation chronic inflammation but no evidence of a skin cancer. This is also great news. Overall very pleased everything seems to be doing much better compared to the previous evaluation. The patient is glad to hear this as well. 07/06/18 on evaluation today patient's right form actually appears to be doing excellent at this point in regard to his overall appearance of the wound bed. Fortunately there does not appear to be any evidence of infection at this time which is good news. No fevers, chills, nausea, or vomiting noted at this time. 07/13/18 on evaluation today patient appears to be doing very well in regard to his right forearm ulcer. He has been tolerating the dressing changes without complication and overall the wound has improved significantly as far as the overall  size is concerned. The appearance is also dramatically improved and extremely healthy  appearing. Electronic Signature(s) Signed: 07/13/2018 12:23:18 PM By: Worthy Keeler PA-C Entered By: Worthy Keeler on 07/13/2018 09:30:02 Evan [name] ([id number]) -------------------------------------------------------------------------------- Physical Exam Details Patient Name: Evan [name] Date of Service: 07/13/2018 8:45 AM Medical Record Number: [id number] Patient Account Number: [id number] Date of Birth/Sex: [birth year] (85 y.o. M) Treating RN: Montey Hora Primary Care Provider: [name] Other Clinician: Referring Provider: [name] Treating Provider/Extender: STONE III, HOYT Weeks in Treatment: 3 Constitutional Well-nourished and well-hydrated in no acute distress. Respiratory normal breathing without difficulty. clear to auscultation bilaterally. Cardiovascular regular rate and rhythm with normal S1, S2. Psychiatric Patient is not able to cooperate in decision making regarding care. Patient is oriented to person and place. pleasant and cooperative. Notes Patient's wound bed did not require any sharp debridement at this point which is excellent news he still has a very friable surface but I think this is due to the fact that he is on blood thinners nothing more than that. He is seen with his daughter in the office at this point. Electronic Signature(s) Signed: 07/13/2018 12:23:18 PM By: Worthy Keeler PA-C Entered By: Worthy Keeler on 07/13/2018 09:30:31 Evan [name] ([id number]) -------------------------------------------------------------------------------- Physician Orders Details Patient Name: Evan [name] Date of Service: 07/13/2018 8:45 AM Medical Record Number: [id number] Patient Account Number: [id number] Date of Birth/Sex: Mar 26, 1928 (85 y.o. M) Treating RN: Montey Hora Primary Care Provider: [name] Other Clinician: Referring Provider: [name] Treating Provider/Extender: Melburn Hake, HOYT Weeks in Treatment: 3 Verbal /  Phone Orders: No Diagnosis Coding ICD-10 Coding Code Description S51.801A Unspecified open wound of right forearm, initial encounter L98.492 Non-pressure 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 fat layer exposed I10 Essential (primary) hypertension Z79.01 Long term (current) use of anticoagulants E08.14 Chronic systolic (congestive) heart failure Z85.118 Personal history of other malignant neoplasm of bronchus and lung Wound Cleansing Wound #1 Right Forearm o Clean wound with Normal Saline. o May Shower, gently pat wound dry prior to applying new dressing. Anesthetic (add to Medication List) Wound #1 Right Forearm o Topical Lidocaine 4% cream applied to wound bed prior to debridement (In Clinic Only). Primary Wound Dressing Wound #1 Right Forearm o Silver Collagen Secondary Dressing Wound #1 Right Forearm o Gauze, ABD and Kerlix/Conform - secure lightly with coban Dressing Change Frequency Wound #1 Right Forearm o Change dressing every other day. Follow-up Appointments Wound #1 Right Forearm o Return Appointment in 1 week. Electronic Signature(s) Signed: 07/13/2018 12:23:18 PM By: Worthy Keeler PA-C Signed: 07/13/2018 2:25:55 PM By: Montey Hora Entered By: Montey Hora on 07/13/2018 09:10:19 Evan [name], Evan [name] ([id number]) Evan [name], Evan [name] ([id number]) -------------------------------------------------------------------------------- Problem List Details Patient Name: Evan [name] Date of Service: 07/13/2018 8:45 AM Medical Record Number: [id number] Patient Account Number: [id number] Date of Birth/Sex: June 04, 1927 (85 y.o. M) Treating RN: Montey Hora Primary Care Provider: [name] Other Clinician: Referring Provider: [name] Treating Provider/Extender: Melburn Hake, HOYT Weeks in Treatment: 3 Active Problems ICD-10 Evaluated Encounter Code Description Active Date Today Diagnosis S51.801A Unspecified open wound of right forearm, initial  encounter 06/22/2018 No Yes L98.492 Non-pressure 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 fat layer 06/22/2018 No Yes exposed [place] (primary) hypertension 06/22/2018 No Yes Z79.01 Long term (current) use of anticoagulants 06/22/2018 No Yes O87.86 Chronic systolic (congestive) heart failure 06/22/2018 No Yes Z85.118 Personal history of other malignant neoplasm of bronchus 06/22/2018 No Yes and lung Inactive  Problems Resolved Problems Electronic Signature(s) Signed: 07/13/2018 12:23:18 PM By: Worthy Keeler PA-C Entered By: Worthy Keeler on 07/13/2018 09:04:24 Evan [name] ([id number]) -------------------------------------------------------------------------------- Progress Note Details Patient Name: Evan [name] Date of Service: 07/13/2018 8:45 AM Medical Record Number: [id number] Patient Account Number: [id number] Date of Birth/Sex: 04/26/1927 (85 y.o. M) Treating RN: Montey Hora Primary Care Provider: [name] Other Clinician: Referring Provider: [name] Treating Provider/Extender: Melburn Hake, HOYT Weeks in Treatment: 3 Subjective Chief Complaint Information obtained from Patient Right forearm skin tear History of Present Illness (HPI) 06/22/18 on evaluation today patient presents for initial inspection here in our clinic concerning an ulcer on the right dorsal forearm. The exact timing of this is somewhat ambiguous although it appears to be possibly around one month from on set. I am told by the patient's daughter was present during the evaluation today with him that he had an area which was itching on the right forearm and subsequently scratched it because in an open wound that led to a hematoma that had to be drained twice when he went to the ER. That was around 06/08/18. Subsequently that was not really effective and he ended up with a congealed hematoma under the area which has since opened. He has been seen by his primary care provider as well and placed  on Keflex which he is currently taking. The patient is on Lovenox twice a day 80 mg. He apparently fell treatment with eloquence he still developed multiple blood clots even on the Eliquis. Therefore he does have issues with bleeding in general. With that being said the patient does have a history of hypertension congestive heart failure, long-term use of anticoagulants, and a personal history of lung cancer which is currently in remission. He has never had any skin cancer issues that they are aware of. He does have a consult which was placed by his primary care provider to the [facility] in [place] although he has not been for that as of yet in fact they do not even have an appointment time. Fortunately there's no evidence of active infection. No fevers, chills, nausea, or vomiting noted at this time. 06/29/18 on evaluation today patient's wound actually appears to be doing significantly better which is great news. He actually did get the [facility] field and has been using that. Subsequently he also has a negative pathology report the show granulation chronic inflammation but no evidence of a skin cancer. This is also great news. Overall very pleased everything seems to be doing much better compared to the previous evaluation. The patient is glad to hear this as well. 07/06/18 on evaluation today patient's right form actually appears to be doing excellent at this point in regard to his overall appearance of the wound bed. Fortunately there does not appear to be any evidence of infection at this time which is good news. No fevers, chills, nausea, or vomiting noted at this time. 07/13/18 on evaluation today patient appears to be doing very well in regard to his right forearm ulcer. He has been tolerating the dressing changes without complication and overall the wound has improved significantly as far as the overall size is concerned. The appearance is also dramatically improved and extremely  healthy appearing. Patient History Information obtained from Patient. Family History Diabetes - Siblings, Heart Disease - Mother,Siblings, Hypertension - Child, Thyroid Problems - Child, No family history of Cancer, Hereditary Spherocytosis, Kidney Disease, Lung Disease, Seizures, Stroke, Tuberculosis. Social History Never smoker, Marital Status - Widowed,  Alcohol Use - Never, Drug Use - No History, Caffeine Use - Never. Medical History Eyes Patient has history of Cataracts Evan [name], Evan [name] ([id number]) Denies history of Glaucoma, Optic Neuritis Ear/Nose/Mouth/Throat Denies history of Chronic sinus problems/congestion, Middle ear problems Hematologic/Lymphatic Denies history of Anemia, Hemophilia, Human Immunodeficiency Virus, Lymphedema, Sickle Cell Disease Respiratory Denies history of Aspiration, Asthma, Chronic Obstructive Pulmonary Disease (COPD), Pneumothorax, Sleep Apnea, Tuberculosis Cardiovascular Patient has history of Congestive Heart Failure - 3 months, Hypertension Denies history of Angina, Arrhythmia, Coronary Artery Disease, Deep Vein Thrombosis, Hypotension, Myocardial Infarction, Peripheral Arterial Disease, Peripheral Venous Disease, Phlebitis, Vasculitis Gastrointestinal Denies history of Cirrhosis , Colitis, Crohn s, Hepatitis A, Hepatitis B, Hepatitis C Endocrine Denies history of Type I Diabetes, Type II Diabetes Genitourinary Denies history of End Stage Renal Disease Immunological Denies history of Lupus Erythematosus, Raynaud s, Scleroderma Integumentary (Skin) Denies history of History of Burn, History of pressure wounds Musculoskeletal Patient has history of Rheumatoid Arthritis Denies history of Gout, Osteoarthritis, Osteomyelitis Neurologic Denies history of Dementia, Neuropathy, Quadriplegia, Paraplegia, Seizure Disorder Oncologic Patient has history of Received Radiation - lung cancer 2 years Denies history of Received  Chemotherapy Psychiatric Denies history of Anorexia/bulimia, Confinement Anxiety Review of Systems (ROS) Constitutional Symptoms (General Health) Denies complaints or symptoms of Fatigue, Fever, Chills, Marked Weight Change. Respiratory Denies complaints or symptoms of Chronic or frequent coughs, Shortness of Breath. Cardiovascular Denies complaints or symptoms of Chest pain, LE edema. Psychiatric Denies complaints or symptoms of Anxiety, Claustrophobia. Objective Constitutional Well-nourished and well-hydrated in no acute distress. Vitals Time Taken: 8:50 AM, Height: 65 in, Weight: 175 lbs, BMI: 29.1, Temperature: 97.9 F, Pulse: 74 bpm, Respiratory Rate: 18 breaths/min, Blood Pressure: 112/52 mmHg. Evan [name], Evan [name] ([id number]) Respiratory normal breathing without difficulty. clear to auscultation bilaterally. Cardiovascular regular rate and rhythm with normal S1, S2. Psychiatric Patient is not able to cooperate in decision making regarding care. Patient is oriented to person and place. pleasant and cooperative. General Notes: Patient's wound bed did not require any sharp debridement at this point which is excellent news he still has a very friable surface but I think this is due to the fact that he is on blood thinners nothing more than that. He is seen with his daughter in the office at this point. Integumentary (Hair, Skin) Wound #1 status is Open. Original cause of wound was Trauma. The wound is located on the Right Forearm. The wound measures 3.3cm length x 2.7cm width x 0.1cm depth; 6.998cm^2 area and 0.7cm^3 volume. There is Fat Layer (Subcutaneous Tissue) Exposed exposed. There is no tunneling or undermining noted. There is a large amount of sanguinous drainage noted. The wound margin is flat and intact. There is large (67-100%) red granulation within the wound bed. There is a small (1-33%) amount of necrotic tissue within the wound bed including Adherent Slough. The  periwound skin appearance did not exhibit: Callus, Crepitus, Excoriation, Induration, Rash, Scarring, Dry/Scaly, Maceration, Atrophie Blanche, Cyanosis, Ecchymosis, Hemosiderin Staining, Mottled, Pallor, Rubor, Erythema. The periwound has tenderness on palpation. Assessment Active Problems ICD-10 Unspecified open wound of right forearm, initial encounter Non-pressure 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 fat layer exposed Essential (primary) hypertension Long term (current) use of anticoagulants Chronic systolic (congestive) heart failure Personal history of other malignant neoplasm of bronchus and lung Plan Wound Cleansing: Wound #1 Right Forearm: Clean wound with Normal Saline. May Shower, gently pat wound dry prior to applying new dressing. Anesthetic (add to Medication List): Wound #1 Right Forearm: Topical Lidocaine  4% cream applied to wound bed prior to debridement (In Clinic Only). Primary Wound Dressing: Wound #1 Right Forearm: Silver Collagen Evan [name], Evan [name] ([id number]) Secondary Dressing: Wound #1 Right Forearm: Gauze, ABD and Kerlix/Conform - secure lightly with coban Dressing Change Frequency: Wound #1 Right Forearm: Change dressing every other day. Follow-up Appointments: Wound #1 Right Forearm: Return Appointment in 1 week. My suggestion is gonna be that we go ahead and continue with the above wound care measures for the next week and the patient is in agreement with plan. No sharp debridement was necessary today which is good news. Anything changes worsens meantime they will contact the office and let me know. Please see above for specific wound care orders. We will see patient for re-evaluation in 1 week(s) here in the clinic. If anything worsens or changes patient will contact our office for additional recommendations. Electronic Signature(s) Signed: 07/13/2018 12:23:18 PM By: Worthy Keeler PA-C Entered By: Worthy Keeler on 07/13/2018 09:30:45 Evan [name] ([id number]) -------------------------------------------------------------------------------- ROS/PFSH Details Patient Name: Evan [name] Date of Service: 07/13/2018 8:45 AM Medical Record Number: [id number] Patient Account Number: [id number] Date of Birth/Sex: 1928-02-20 (85 y.o. M) Treating RN: Montey Hora Primary Care Provider: [name] Other Clinician: Referring Provider: [name] Treating Provider/Extender: Melburn Hake, HOYT Weeks in Treatment: 3 Information Obtained From Patient Constitutional Symptoms (General Health) Complaints and Symptoms: Negative for: Fatigue; Fever; Chills; Marked Weight Change Respiratory Complaints and Symptoms: Negative for: Chronic or frequent coughs; Shortness of Breath Medical History: Negative for: Aspiration; Asthma; Chronic Obstructive Pulmonary Disease (COPD); Pneumothorax; Sleep Apnea; Tuberculosis Cardiovascular Complaints and Symptoms: Negative for: Chest pain; LE edema Medical History: Positive for: Congestive Heart Failure - 3 months; Hypertension Negative for: Angina; Arrhythmia; Coronary Artery Disease; Deep Vein Thrombosis; Hypotension; Myocardial Infarction; Peripheral Arterial Disease; Peripheral Venous Disease; Phlebitis; Vasculitis Psychiatric Complaints and Symptoms: Negative for: Anxiety; Claustrophobia Medical History: Negative for: Anorexia/bulimia; Confinement Anxiety Eyes Medical History: Positive for: Cataracts Negative for: Glaucoma; Optic Neuritis Ear/Nose/Mouth/Throat Medical History: Negative for: Chronic sinus problems/congestion; Middle ear problems Hematologic/Lymphatic Medical History: Negative for: Anemia; Hemophilia; Human Immunodeficiency Virus; Lymphedema; Sickle Cell Disease Evan [name], Evan [name] ([id number]) Gastrointestinal Medical History: Negative for: Cirrhosis ; Colitis; Crohnos; Hepatitis A; Hepatitis B; Hepatitis C Endocrine Medical History: Negative for: Type I Diabetes;  Type II Diabetes Genitourinary Medical History: Negative for: End Stage Renal Disease Immunological Medical History: Negative for: Lupus Erythematosus; Raynaudos; Scleroderma Integumentary (Skin) Medical History: Negative for: History of Burn; History of pressure wounds Musculoskeletal Medical History: Positive for: Rheumatoid Arthritis Negative for: Gout; Osteoarthritis; Osteomyelitis Neurologic Medical History: Negative for: Dementia; Neuropathy; Quadriplegia; Paraplegia; Seizure Disorder Oncologic Medical History: Positive for: Received Radiation - lung cancer 2 years Negative for: Received Chemotherapy HBO Extended History Items Eyes: Cataracts Immunizations Pneumococcal Vaccine: Received Pneumococcal Vaccination: Yes Implantable Devices None Family and Social History Cancer: No; Diabetes: Yes - Siblings; Heart Disease: Yes - Mother,Siblings; Hereditary Spherocytosis: No; Hypertension: Yes - Child; Kidney Disease: No; Lung Disease: No; Seizures: No; Stroke: No; Thyroid Problems: Yes - Child; Tuberculosis: No; Never smoker; Marital Status - Widowed; Alcohol Use: Never; Drug Use: No History; Caffeine Use: Never; Financial Concerns: No; Food, Clothing or Shelter Needs: No; Support System Lacking: No; Transportation Concerns: No Evan [name], Evan [name] ([id number]) Physician Affirmation I have reviewed and agree with the above information. Electronic Signature(s) Signed: 07/13/2018 12:23:18 PM By: Worthy Keeler PA-C Signed: 07/13/2018 2:25:55 PM By: Montey Hora Entered By: Worthy Keeler on 07/13/2018 09:30:15 Evan [name] ([id number]) --------------------------------------------------------------------------------  SuperBill Details Patient Name: Evan [name], Evan [name] Date of Service: 07/13/2018 Medical Record Number: [id number] Patient Account Number: [id number] Date of Birth/Sex: Jul 28, 1927 (85 y.o. M) Treating RN: Montey Hora Primary Care Provider: [name] Other  Clinician: Referring Provider: [name] Treating Provider/Extender: Melburn Hake, HOYT Weeks in Treatment: 3 Diagnosis Coding ICD-10 Codes Code Description S51.801A Unspecified open wound of right forearm, initial encounter L98.492 Non-pressure 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 fat layer exposed I10 Essential (primary) hypertension Z79.01 Long term (current) use of anticoagulants I51.89 Chronic systolic (congestive) heart failure Z85.118 Personal history of other malignant neoplasm of bronchus and lung Facility Procedures CPT4 Code: 84210312 Description: 99213 - WOUND CARE VISIT-LEV 3 EST PT Modifier: Quantity: 1 Physician Procedures CPT4 Code: [id number] Description: 73736 - WC PHYS LEVEL 4 - EST PT ICD-10 Diagnosis Description S51.801A Unspecified open wound of right forearm, initial encounter L98.492 Non-pressure 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 fat l I10 Essential (primary) hypertension  Z79.01 Long term (current) use of anticoagulants Modifier: ayer exposed Quantity: 1 Electronic Signature(s) Signed: 07/13/2018 12:23:18 PM By: Worthy Keeler PA-C Entered By: Worthy Keeler on 07/13/2018 09:30:57

## 2018-07-14 ENCOUNTER — Other Ambulatory Visit: Payer: Self-pay

## 2018-07-14 ENCOUNTER — Inpatient Hospital Stay (HOSPITAL_COMMUNITY)
Admission: EM | Admit: 2018-07-14 | Discharge: 2018-07-16 | DRG: 065 | Disposition: A | Discharge status: Rehabilitation Facility | Payer: Medicare HMO | Attending: Neurology | Admitting: Neurology

## 2018-07-14 ENCOUNTER — Emergency Department (HOSPITAL_COMMUNITY): Payer: Medicare HMO

## 2018-07-14 ENCOUNTER — Inpatient Hospital Stay (HOSPITAL_COMMUNITY): Payer: Medicare HMO

## 2018-07-14 ENCOUNTER — Encounter (HOSPITAL_COMMUNITY): Payer: Self-pay | Admitting: Emergency Medicine

## 2018-07-14 DIAGNOSIS — F339 Major depressive disorder, recurrent, unspecified: Secondary | ICD-10-CM | POA: Diagnosis not present

## 2018-07-14 DIAGNOSIS — I610 Nontraumatic intracerebral hemorrhage in hemisphere, subcortical: Secondary | ICD-10-CM | POA: Diagnosis not present

## 2018-07-14 DIAGNOSIS — W19XXXA Unspecified fall, initial encounter: Secondary | ICD-10-CM

## 2018-07-14 DIAGNOSIS — Z85118 Personal history of other malignant neoplasm of bronchus and lung: Secondary | ICD-10-CM | POA: Diagnosis not present

## 2018-07-14 DIAGNOSIS — G8191 Hemiplegia, unspecified affecting right dominant side: Secondary | ICD-10-CM | POA: Diagnosis not present

## 2018-07-14 DIAGNOSIS — I10 Essential (primary) hypertension: Secondary | ICD-10-CM | POA: Diagnosis not present

## 2018-07-14 DIAGNOSIS — Z955 Presence of coronary angioplasty implant and graft: Secondary | ICD-10-CM

## 2018-07-14 DIAGNOSIS — R0989 Other specified symptoms and signs involving the circulatory and respiratory systems: Secondary | ICD-10-CM | POA: Diagnosis not present

## 2018-07-14 DIAGNOSIS — K219 Gastro-esophageal reflux disease without esophagitis: Secondary | ICD-10-CM | POA: Diagnosis present

## 2018-07-14 DIAGNOSIS — D696 Thrombocytopenia, unspecified: Secondary | ICD-10-CM | POA: Diagnosis present

## 2018-07-14 DIAGNOSIS — E1122 Type 2 diabetes mellitus with diabetic chronic kidney disease: Secondary | ICD-10-CM | POA: Diagnosis present

## 2018-07-14 DIAGNOSIS — I69251 Hemiplegia and hemiparesis following other nontraumatic intracranial hemorrhage affecting right dominant side: Secondary | ICD-10-CM | POA: Diagnosis not present

## 2018-07-14 DIAGNOSIS — E785 Hyperlipidemia, unspecified: Secondary | ICD-10-CM | POA: Diagnosis not present

## 2018-07-14 DIAGNOSIS — Z951 Presence of aortocoronary bypass graft: Secondary | ICD-10-CM

## 2018-07-14 DIAGNOSIS — D689 Coagulation defect, unspecified: Secondary | ICD-10-CM

## 2018-07-14 DIAGNOSIS — S41111A Laceration without foreign body of right upper arm, initial encounter: Secondary | ICD-10-CM

## 2018-07-14 DIAGNOSIS — D72828 Other elevated white blood cell count: Secondary | ICD-10-CM | POA: Diagnosis not present

## 2018-07-14 DIAGNOSIS — H9190 Unspecified hearing loss, unspecified ear: Secondary | ICD-10-CM | POA: Diagnosis present

## 2018-07-14 DIAGNOSIS — I4891 Unspecified atrial fibrillation: Secondary | ICD-10-CM | POA: Diagnosis not present

## 2018-07-14 DIAGNOSIS — I160 Hypertensive urgency: Secondary | ICD-10-CM | POA: Diagnosis present

## 2018-07-14 DIAGNOSIS — Z9981 Dependence on supplemental oxygen: Secondary | ICD-10-CM | POA: Diagnosis not present

## 2018-07-14 DIAGNOSIS — R0603 Acute respiratory distress: Secondary | ICD-10-CM | POA: Diagnosis not present

## 2018-07-14 DIAGNOSIS — I82409 Acute embolism and thrombosis of unspecified deep veins of unspecified lower extremity: Secondary | ICD-10-CM | POA: Diagnosis not present

## 2018-07-14 DIAGNOSIS — R9431 Abnormal electrocardiogram [ECG] [EKG]: Secondary | ICD-10-CM | POA: Diagnosis not present

## 2018-07-14 DIAGNOSIS — R0602 Shortness of breath: Secondary | ICD-10-CM | POA: Diagnosis not present

## 2018-07-14 DIAGNOSIS — I2510 Atherosclerotic heart disease of native coronary artery without angina pectoris: Secondary | ICD-10-CM | POA: Diagnosis present

## 2018-07-14 DIAGNOSIS — R109 Unspecified abdominal pain: Secondary | ICD-10-CM | POA: Diagnosis not present

## 2018-07-14 DIAGNOSIS — I129 Hypertensive chronic kidney disease with stage 1 through stage 4 chronic kidney disease, or unspecified chronic kidney disease: Secondary | ICD-10-CM | POA: Diagnosis not present

## 2018-07-14 DIAGNOSIS — J9601 Acute respiratory failure with hypoxia: Secondary | ICD-10-CM | POA: Diagnosis not present

## 2018-07-14 DIAGNOSIS — R599 Enlarged lymph nodes, unspecified: Secondary | ICD-10-CM

## 2018-07-14 DIAGNOSIS — I252 Old myocardial infarction: Secondary | ICD-10-CM | POA: Diagnosis not present

## 2018-07-14 DIAGNOSIS — F411 Generalized anxiety disorder: Secondary | ICD-10-CM | POA: Diagnosis not present

## 2018-07-14 DIAGNOSIS — Z947 Corneal transplant status: Secondary | ICD-10-CM

## 2018-07-14 DIAGNOSIS — R7309 Other abnormal glucose: Secondary | ICD-10-CM | POA: Diagnosis not present

## 2018-07-14 DIAGNOSIS — Z885 Allergy status to narcotic agent status: Secondary | ICD-10-CM

## 2018-07-14 DIAGNOSIS — Z8249 Family history of ischemic heart disease and other diseases of the circulatory system: Secondary | ICD-10-CM

## 2018-07-14 DIAGNOSIS — Z7901 Long term (current) use of anticoagulants: Secondary | ICD-10-CM

## 2018-07-14 DIAGNOSIS — T45515A Adverse effect of anticoagulants, initial encounter: Secondary | ICD-10-CM | POA: Diagnosis present

## 2018-07-14 DIAGNOSIS — D72829 Elevated white blood cell count, unspecified: Secondary | ICD-10-CM | POA: Diagnosis not present

## 2018-07-14 DIAGNOSIS — W06XXXA Fall from bed, initial encounter: Secondary | ICD-10-CM | POA: Diagnosis not present

## 2018-07-14 DIAGNOSIS — R270 Ataxia, unspecified: Secondary | ICD-10-CM | POA: Diagnosis present

## 2018-07-14 DIAGNOSIS — R29703 NIHSS score 3: Secondary | ICD-10-CM | POA: Diagnosis present

## 2018-07-14 DIAGNOSIS — I82552 Chronic embolism and thrombosis of left peroneal vein: Secondary | ICD-10-CM | POA: Diagnosis present

## 2018-07-14 DIAGNOSIS — H9193 Unspecified hearing loss, bilateral: Secondary | ICD-10-CM | POA: Diagnosis not present

## 2018-07-14 DIAGNOSIS — Z79899 Other long term (current) drug therapy: Secondary | ICD-10-CM

## 2018-07-14 DIAGNOSIS — J449 Chronic obstructive pulmonary disease, unspecified: Secondary | ICD-10-CM | POA: Diagnosis not present

## 2018-07-14 DIAGNOSIS — I618 Other nontraumatic intracerebral hemorrhage: Secondary | ICD-10-CM | POA: Diagnosis not present

## 2018-07-14 DIAGNOSIS — Z86711 Personal history of pulmonary embolism: Secondary | ICD-10-CM

## 2018-07-14 DIAGNOSIS — I69391 Dysphagia following cerebral infarction: Secondary | ICD-10-CM | POA: Diagnosis not present

## 2018-07-14 DIAGNOSIS — R54 Age-related physical debility: Secondary | ICD-10-CM | POA: Diagnosis not present

## 2018-07-14 DIAGNOSIS — L03113 Cellulitis of right upper limb: Secondary | ICD-10-CM | POA: Diagnosis not present

## 2018-07-14 DIAGNOSIS — F419 Anxiety disorder, unspecified: Secondary | ICD-10-CM | POA: Diagnosis present

## 2018-07-14 DIAGNOSIS — I2693 Single subsegmental pulmonary embolism without acute cor pulmonale: Secondary | ICD-10-CM | POA: Diagnosis not present

## 2018-07-14 DIAGNOSIS — I69219 Unspecified symptoms and signs involving cognitive functions following other nontraumatic intracranial hemorrhage: Secondary | ICD-10-CM | POA: Diagnosis not present

## 2018-07-14 DIAGNOSIS — I6523 Occlusion and stenosis of bilateral carotid arteries: Secondary | ICD-10-CM | POA: Diagnosis not present

## 2018-07-14 DIAGNOSIS — D62 Acute posthemorrhagic anemia: Secondary | ICD-10-CM | POA: Diagnosis not present

## 2018-07-14 DIAGNOSIS — D649 Anemia, unspecified: Secondary | ICD-10-CM | POA: Diagnosis not present

## 2018-07-14 DIAGNOSIS — R591 Generalized enlarged lymph nodes: Secondary | ICD-10-CM

## 2018-07-14 DIAGNOSIS — Z881 Allergy status to other antibiotic agents status: Secondary | ICD-10-CM

## 2018-07-14 DIAGNOSIS — Z888 Allergy status to other drugs, medicaments and biological substances status: Secondary | ICD-10-CM

## 2018-07-14 DIAGNOSIS — I2699 Other pulmonary embolism without acute cor pulmonale: Secondary | ICD-10-CM | POA: Diagnosis not present

## 2018-07-14 DIAGNOSIS — K210 Gastro-esophageal reflux disease with esophagitis: Secondary | ICD-10-CM | POA: Diagnosis present

## 2018-07-14 DIAGNOSIS — D72823 Leukemoid reaction: Secondary | ICD-10-CM | POA: Diagnosis not present

## 2018-07-14 DIAGNOSIS — R739 Hyperglycemia, unspecified: Secondary | ICD-10-CM | POA: Diagnosis not present

## 2018-07-14 DIAGNOSIS — M069 Rheumatoid arthritis, unspecified: Secondary | ICD-10-CM | POA: Diagnosis present

## 2018-07-14 DIAGNOSIS — I161 Hypertensive emergency: Secondary | ICD-10-CM | POA: Diagnosis not present

## 2018-07-14 DIAGNOSIS — Z87891 Personal history of nicotine dependence: Secondary | ICD-10-CM

## 2018-07-14 DIAGNOSIS — S00532A Contusion of oral cavity, initial encounter: Secondary | ICD-10-CM | POA: Diagnosis not present

## 2018-07-14 DIAGNOSIS — N183 Chronic kidney disease, stage 3 (moderate): Secondary | ICD-10-CM | POA: Diagnosis present

## 2018-07-14 DIAGNOSIS — D509 Iron deficiency anemia, unspecified: Secondary | ICD-10-CM | POA: Diagnosis present

## 2018-07-14 DIAGNOSIS — Z91040 Latex allergy status: Secondary | ICD-10-CM

## 2018-07-14 DIAGNOSIS — R5383 Other fatigue: Secondary | ICD-10-CM | POA: Diagnosis not present

## 2018-07-14 DIAGNOSIS — I619 Nontraumatic intracerebral hemorrhage, unspecified: Secondary | ICD-10-CM | POA: Diagnosis not present

## 2018-07-14 DIAGNOSIS — N289 Disorder of kidney and ureter, unspecified: Secondary | ICD-10-CM

## 2018-07-14 DIAGNOSIS — S0083XA Contusion of other part of head, initial encounter: Secondary | ICD-10-CM | POA: Diagnosis not present

## 2018-07-14 DIAGNOSIS — I480 Paroxysmal atrial fibrillation: Secondary | ICD-10-CM

## 2018-07-14 DIAGNOSIS — R05 Cough: Secondary | ICD-10-CM | POA: Diagnosis not present

## 2018-07-14 DIAGNOSIS — R52 Pain, unspecified: Secondary | ICD-10-CM | POA: Diagnosis not present

## 2018-07-14 DIAGNOSIS — N400 Enlarged prostate without lower urinary tract symptoms: Secondary | ICD-10-CM | POA: Diagnosis present

## 2018-07-14 DIAGNOSIS — J9811 Atelectasis: Secondary | ICD-10-CM | POA: Diagnosis not present

## 2018-07-14 DIAGNOSIS — E871 Hypo-osmolality and hyponatremia: Secondary | ICD-10-CM | POA: Diagnosis not present

## 2018-07-14 DIAGNOSIS — R001 Bradycardia, unspecified: Secondary | ICD-10-CM | POA: Diagnosis not present

## 2018-07-14 DIAGNOSIS — Z9049 Acquired absence of other specified parts of digestive tract: Secondary | ICD-10-CM

## 2018-07-14 DIAGNOSIS — F329 Major depressive disorder, single episode, unspecified: Secondary | ICD-10-CM | POA: Diagnosis present

## 2018-07-14 DIAGNOSIS — I4821 Permanent atrial fibrillation: Secondary | ICD-10-CM | POA: Diagnosis present

## 2018-07-14 DIAGNOSIS — Z96653 Presence of artificial knee joint, bilateral: Secondary | ICD-10-CM | POA: Diagnosis present

## 2018-07-14 DIAGNOSIS — R609 Edema, unspecified: Secondary | ICD-10-CM | POA: Diagnosis not present

## 2018-07-14 DIAGNOSIS — Z7983 Long term (current) use of bisphosphonates: Secondary | ICD-10-CM

## 2018-07-14 DIAGNOSIS — Z86718 Personal history of other venous thrombosis and embolism: Secondary | ICD-10-CM

## 2018-07-14 DIAGNOSIS — E873 Alkalosis: Secondary | ICD-10-CM | POA: Diagnosis not present

## 2018-07-14 HISTORY — DX: Coagulation defect, unspecified: D68.9

## 2018-07-14 LAB — CBC
HCT: 36.6 % — ABNORMAL LOW (ref 39.0–52.0)
Hemoglobin: 11.7 g/dL — ABNORMAL LOW (ref 13.0–17.0)
MCH: 30.7 pg (ref 26.0–34.0)
MCHC: 32 g/dL (ref 30.0–36.0)
MCV: 96.1 fL (ref 80.0–100.0)
Platelets: 134 10*3/uL — ABNORMAL LOW (ref 150–400)
RBC: 3.81 MIL/uL — ABNORMAL LOW (ref 4.22–5.81)
RDW: 13.9 % (ref 11.5–15.5)
WBC: 7.5 10*3/uL (ref 4.0–10.5)
nRBC: 0 % (ref 0.0–0.2)

## 2018-07-14 LAB — URINALYSIS, ROUTINE W REFLEX MICROSCOPIC
Bilirubin Urine: NEGATIVE
Glucose, UA: NEGATIVE mg/dL
Hgb urine dipstick: NEGATIVE
Ketones, ur: NEGATIVE mg/dL
Leukocytes,Ua: NEGATIVE
Nitrite: NEGATIVE
Protein, ur: NEGATIVE mg/dL
Specific Gravity, Urine: 1.005 (ref 1.005–1.030)
pH: 7 (ref 5.0–8.0)

## 2018-07-14 LAB — COMPREHENSIVE METABOLIC PANEL
ALT: 16 U/L (ref 0–44)
AST: 29 U/L (ref 15–41)
Albumin: 3.3 g/dL — ABNORMAL LOW (ref 3.5–5.0)
Alkaline Phosphatase: 113 U/L (ref 38–126)
Anion gap: 13 (ref 5–15)
BUN: 20 mg/dL (ref 8–23)
CO2: 19 mmol/L — ABNORMAL LOW (ref 22–32)
Calcium: 9.2 mg/dL (ref 8.9–10.3)
Chloride: 103 mmol/L (ref 98–111)
Creatinine, Ser: 1.52 mg/dL — ABNORMAL HIGH (ref 0.61–1.24)
GFR calc Af Amer: 47 mL/min — ABNORMAL LOW (ref >60)
GFR calc non Af Amer: 40 mL/min — ABNORMAL LOW (ref >60)
Glucose, Bld: 139 mg/dL — ABNORMAL HIGH (ref 70–99)
Potassium: 4.3 mmol/L (ref 3.5–5.1)
Sodium: 135 mmol/L (ref 135–145)
Total Bilirubin: 0.2 mg/dL — ABNORMAL LOW (ref 0.3–1.2)
Total Protein: 6.2 g/dL — ABNORMAL LOW (ref 6.5–8.1)

## 2018-07-14 LAB — RAPID URINE DRUG SCREEN, HOSP PERFORMED
Amphetamines: NOT DETECTED
Barbiturates: NOT DETECTED
Benzodiazepines: POSITIVE — AB
Cocaine: NOT DETECTED
Opiates: NOT DETECTED
Tetrahydrocannabinol: NOT DETECTED

## 2018-07-14 LAB — MRSA PCR SCREENING: MRSA by PCR: NEGATIVE

## 2018-07-14 LAB — DIFFERENTIAL
Abs Immature Granulocytes: 0.03 10*3/uL (ref 0.00–0.07)
Basophils Absolute: 0 10*3/uL (ref 0.0–0.1)
Basophils Relative: 0 %
Eosinophils Absolute: 0 10*3/uL (ref 0.0–0.5)
Eosinophils Relative: 0 %
Immature Granulocytes: 0 %
Lymphocytes Relative: 15 %
Lymphs Abs: 1.1 10*3/uL (ref 0.7–4.0)
Monocytes Absolute: 1.1 10*3/uL — ABNORMAL HIGH (ref 0.1–1.0)
Monocytes Relative: 15 %
Neutro Abs: 5.2 10*3/uL (ref 1.7–7.7)
Neutrophils Relative %: 70 %

## 2018-07-14 LAB — PROTIME-INR
INR: 1.1 (ref 0.8–1.2)
Prothrombin Time: 14.2 seconds (ref 11.4–15.2)

## 2018-07-14 LAB — APTT: aPTT: 41 seconds — ABNORMAL HIGH (ref 24–36)

## 2018-07-14 LAB — ETHANOL: Alcohol, Ethyl (B): <10 mg/dL (ref <10)

## 2018-07-14 MED ORDER — SENNOSIDES-DOCUSATE SODIUM 8.6-50 MG PO TABS
1.0000 | ORAL_TABLET | Freq: Two times a day (BID) | ORAL | Status: DC
  Administered 2018-07-14 – 2018-07-16 (×4): 1 via ORAL
  Filled 2018-07-14 (×4): qty 1

## 2018-07-14 MED ORDER — IOHEXOL 350 MG/ML SOLN
75.0000 mL | Freq: Once | INTRAVENOUS | Status: AC | PRN
  Administered 2018-07-14: 75 mL via INTRAVENOUS

## 2018-07-14 MED ORDER — FUROSEMIDE 20 MG PO TABS
20.0000 mg | ORAL_TABLET | Freq: Every day | ORAL | Status: DC
  Administered 2018-07-14 – 2018-07-16 (×3): 20 mg via ORAL
  Filled 2018-07-14 (×3): qty 1

## 2018-07-14 MED ORDER — PANTOPRAZOLE SODIUM 40 MG IV SOLR: 40.0000 mg | Freq: Every day | INTRAVENOUS | Status: DC

## 2018-07-14 MED ORDER — LORAZEPAM 0.5 MG PO TABS
0.5000 mg | ORAL_TABLET | Freq: Three times a day (TID) | ORAL | Status: DC
  Administered 2018-07-14 – 2018-07-16 (×7): 0.5 mg via ORAL
  Filled 2018-07-14 (×7): qty 1

## 2018-07-14 MED ORDER — LABETALOL HCL 5 MG/ML IV SOLN
20.0000 mg | Freq: Once | INTRAVENOUS | Status: AC
  Administered 2018-07-14: 20 mg via INTRAVENOUS
  Filled 2018-07-14: qty 4

## 2018-07-14 MED ORDER — METOPROLOL TARTRATE 12.5 MG HALF TABLET
12.5000 mg | ORAL_TABLET | Freq: Two times a day (BID) | ORAL | Status: DC
  Administered 2018-07-14 – 2018-07-16 (×5): 12.5 mg via ORAL
  Filled 2018-07-14 (×5): qty 1

## 2018-07-14 MED ORDER — CLEVIDIPINE BUTYRATE 0.5 MG/ML IV EMUL
0.0000 mg/h | INTRAVENOUS | Status: DC
  Administered 2018-07-14: 2 mg/h via INTRAVENOUS
  Filled 2018-07-14: qty 50

## 2018-07-14 MED ORDER — PANTOPRAZOLE SODIUM 40 MG PO TBEC
40.0000 mg | DELAYED_RELEASE_TABLET | ORAL | Status: DC
  Administered 2018-07-14 – 2018-07-16 (×2): 40 mg via ORAL
  Filled 2018-07-14 (×3): qty 1

## 2018-07-14 MED ORDER — IPRATROPIUM-ALBUTEROL 0.5-2.5 (3) MG/3ML IN SOLN: 3.0000 mL | Freq: Four times a day (QID) | RESPIRATORY_TRACT | Status: DC | PRN

## 2018-07-14 MED ORDER — ROSUVASTATIN CALCIUM 5 MG PO TABS
5.0000 mg | ORAL_TABLET | Freq: Every day | ORAL | Status: DC
  Administered 2018-07-14 – 2018-07-15 (×2): 5 mg via ORAL
  Filled 2018-07-14 (×2): qty 1

## 2018-07-14 MED ORDER — ACETAMINOPHEN 325 MG PO TABS: 650.0000 mg | ORAL_TABLET | ORAL | Status: DC | PRN

## 2018-07-14 MED ORDER — STROKE: EARLY STAGES OF RECOVERY BOOK
Freq: Once | Status: AC
  Administered 2018-07-14: 11:00:00

## 2018-07-14 MED ORDER — ACETAMINOPHEN 650 MG RE SUPP: 650.0000 mg | RECTAL | Status: DC | PRN

## 2018-07-14 MED ORDER — LABETALOL HCL 5 MG/ML IV SOLN: 5.0000 mg | INTRAVENOUS | Status: DC | PRN

## 2018-07-14 MED ORDER — CEPHALEXIN 500 MG PO CAPS
500.0000 mg | ORAL_CAPSULE | Freq: Three times a day (TID) | ORAL | Status: DC
  Administered 2018-07-14 – 2018-07-16 (×7): 500 mg via ORAL
  Filled 2018-07-14 (×8): qty 1

## 2018-07-14 MED ORDER — TAMSULOSIN HCL 0.4 MG PO CAPS
0.4000 mg | ORAL_CAPSULE | Freq: Every day | ORAL | Status: DC
  Administered 2018-07-14 – 2018-07-16 (×3): 0.4 mg via ORAL
  Filled 2018-07-14 (×3): qty 1

## 2018-07-14 MED ORDER — FLUOXETINE HCL 20 MG PO CAPS
20.0000 mg | ORAL_CAPSULE | Freq: Every day | ORAL | Status: DC
  Administered 2018-07-14 – 2018-07-16 (×3): 20 mg via ORAL
  Filled 2018-07-14 (×3): qty 1

## 2018-07-14 MED ORDER — MAGNESIUM OXIDE 400 (241.3 MG) MG PO TABS
400.0000 mg | ORAL_TABLET | Freq: Two times a day (BID) | ORAL | Status: DC
  Administered 2018-07-14 – 2018-07-16 (×5): 400 mg via ORAL
  Filled 2018-07-14 (×5): qty 1

## 2018-07-14 MED ORDER — ACETAMINOPHEN 160 MG/5ML PO SOLN: 650.0000 mg | ORAL | Status: DC | PRN

## 2018-07-14 NOTE — Progress Notes (Signed)
VASCULAR LAB PRELIMINARY  PRELIMINARY  PRELIMINARY  PRELIMINARY  Bilateral lower extremity venous duplex completed.    Preliminary report:  See CV proc for preliminary results.  Natika Geyer, RVT 07/14/2018, 11:47 AM

## 2018-07-14 NOTE — ED Notes (Signed)
Pt returned from CT with this RN, hearing aid placed back in L ear.

## 2018-07-14 NOTE — Consult Note (Signed)
NAME:  THELMA VIANA, MRN:  [id number], DOB:  12-Apr-1927, LOS: 0 ADMISSION DATE:  07/14/2018, CONSULTATION DATE:  07/14/2018 REFERRING MD:  Dr Erlinda Hong, Neurology, CHIEF COMPLAINT:  Possible C-spine injury due to fall    History of present illness   Mr. Samons is 55 and has a history of adenocarcinoma right lower lobe s/p SBRT, possible associated radiation pneumonitis, DVT and pulmonary embolism on therapeutic enoxaparin.  He also has history of RA, on [place], CAD/CABG, obstructive lung disease.  He was found on the floor 4/10 by family.  Evaluated in the emergency department and found to have right-sided hemiparesis, CT head showed a small volume left thalamic ICH without any evidence of intraventricular extension or midline shift.  Was felt that the bleed was the primary insult, not traumatic or due to the fall.  Anticoagulation held.  He was placed in a cervical collar since he did have a fall and was found on the floor by family.  CT of the cervical spine done on 4/11 showed chronic retrolisthesis of C3 on C4, chronic fracture of the right transverse process of T1, right facial/neck contusion, normal spinal canal, moderate cervical spondylolysis, moderate to advanced spinal stenosis at C4-5, but no acute fracture or evidence for instability.   We are consulted to assist with clearing his C-spine, discussed removing his c-collar.   Objective   Blood pressure 133/84, pulse 66, temperature (!) 97.4 F (36.3 C), temperature source Axillary, resp. rate 18, height 5\' 5"  (1.651 m), weight 79.8 kg, SpO2 100 %.        Intake/Output Summary (Last 24 hours) at 07/14/2018 0926 Last data filed at 07/14/2018 0538 Gross per 24 hour  Intake --  Output 200 ml  Net -200 ml   Filed Weights   07/14/18 0538  Weight: 79.8 kg    Examination: General: Pleasant elderly gentleman, in no distress HENT: C-collar is in place.  He has some bruising along the right anterior aspect of his neck.  No posterior tenderness  to palpation, no tenderness to palpation of each cervical body, passive rotation, controlled flexion Lungs: Clear bilaterally, no crackles, no wheezing Cardiovascular: Regular, no murmur, distant Abdomen: Soft, nontender, positive bowel sounds Extremities: No significant edema.  He does have a dressed area of cellulitis on his right forearm without purulence.  There is a more proximal right upper extremity skin tear that is new from his fall Neuro: Awake, interacting, alert and answers questions.  He is very hard of hearing but no other focal deficits noted.  Resolved Hospital Problem list     Assessment & Plan:  Acute left thalamic ICH on therapeutic anticoagulation History of DVT/PE in the setting of malignancy, diagnosed 02/2018 History of right lower lobe adenocarcinoma, SBRT Mechanical fall, precautionary C-spine collar placed COPD  -Anticoagulation appropriately on hold.  We will need to determine if/when it would be safe to reinitiate given his history of pulmonary embolism and malignancy.  Would perform lower extremity Doppler ultrasound, could consider IVC filter placement if significant lower extremity clot burden and we determine that reinitiation of anticoagulation is not possible -Tight blood pressure control as per neurology orders -Office notes indicate he was on DuoNeb prn, will restart -I tried to clarify with the patient whether he is on chronic prednisone or whether the prednisone on his medication list was part of tapered treatment.  He is not sure of his medications.  We will need to confirm this with family -Based on CT scan of the cervical  spine, clinical exam no evidence for C-spine injury.  I will write orders to remove his c-collar.     Best practice:  Diet: Heart healthy Pain/Anxiety/Delirium protocol (if indicated): N/A VAP protocol (if indicated): N/A DVT prophylaxis: SCD only GI prophylaxis: Pantoprazole Glucose control: NA Mobility: Bedrest Code  Status: Full Family Communication: per Neurology 4/11 Disposition: ICU   Labs   CBC: Recent Labs  Lab 07/14/18 0536  WBC 7.5  NEUTROABS 5.2  HGB 11.7*  HCT 36.6*  MCV 96.1  PLT 134*    Basic Metabolic Panel: Recent Labs  Lab 07/14/18 0536  NA 135  K 4.3  CL 103  CO2 19*  GLUCOSE 139*  BUN 20  CREATININE 1.52*  CALCIUM 9.2   GFR: Estimated Creatinine Clearance: 32.7 mL/min (A) (by C-G formula based on SCr of 1.52 mg/dL (H)). Recent Labs  Lab 07/14/18 0536  WBC 7.5    Liver Function Tests: Recent Labs  Lab 07/14/18 0536  AST 29  ALT 16  ALKPHOS 113  BILITOT 0.2*  PROT 6.2*  ALBUMIN 3.3*   No results for input(s): LIPASE, AMYLASE in the last 168 hours. No results for input(s): AMMONIA in the last 168 hours.  ABG No results found for: PHART, PCO2ART, PO2ART, HCO3, TCO2, ACIDBASEDEF, O2SAT   Coagulation Profile: Recent Labs  Lab 07/14/18 0536  INR 1.1    Cardiac Enzymes: No results for input(s): CKTOTAL, CKMB, CKMBINDEX, TROPONINI in the last 168 hours.  HbA1C: Hgb A1c MFr Bld  Date/Time Value Ref Range Status  12/02/2016 04:32 PM 5.3 <5.7 % Final    Comment:      For the purpose of screening for the presence of diabetes:   <5.7%       Consistent with the absence of diabetes 5.7-6.4 %   Consistent with increased risk for diabetes (prediabetes) >=6.5 %     Consistent with diabetes   This assay result is consistent with a decreased risk of diabetes.   Currently, no consensus exists regarding use of hemoglobin A1c for diagnosis of diabetes in children.   According to American Diabetes Association (ADA) guidelines, hemoglobin A1c <7.0% represents optimal control in non-pregnant diabetic patients. Different metrics may apply to specific patient populations. Standards of Medical Care in Diabetes (ADA).       CBG: No results for input(s): GLUCAP in the last 168 hours.  Review of Systems:   Patient denies any pain, dyspnea  Past  Medical History  He,  has a past medical history of Adenocarcinoma of right lung ([place]) (01/06/2016), Anginal chest [facility]), Anxiety, Arthritis, BPH (benign prostatic hyperplasia), CAD (coronary artery disease), Chronic lower back pain, Complication of anesthesia, Depression, DJD (degenerative joint disease), Esophageal ulcer without bleeding, Family history of adverse reaction to anesthesia, GERD (gastroesophageal reflux disease), Headache, Hemochromatosis, Hepatitis, History of ASCVD, Hyperlipidemia, Hypertension, Myocardial infarction ([place]) (1999), Osteoarthritis, Pneumonia, Rheumatoid arthritis ([place]), Subdural hematoma ([place]), and 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[place]) (02/2018).   Surgical History    Past Surgical History:  Procedure Laterality Date   ARM SKIN LESION BIOPSY / EXCISION Left 09/02/2015   CATARACT EXTRACTION W/ INTRAOCULAR LENS  IMPLANT, BILATERAL Bilateral    CHOLECYSTECTOMY OPEN     CORNEAL TRANSPLANT Bilateral    "one at [facility]; one at [facility]"   CORONARY ANGIOPLASTY WITH STENT PLACEMENT     CORONARY ARTERY BYPASS GRAFT  1999   DESCENDING AORTIC ANEURYSM REPAIR W/ STENT     DE stent ostium into the right radial free graft at OM-,  02-2007   ESOPHAGOGASTRODUODENOSCOPY N/A 11/09/2014   Procedure: ESOPHAGOGASTRODUODENOSCOPY (EGD);  Surgeon: [name], MD;  Location: [facility] ENDOSCOPY;  Service: Endoscopy;  Laterality: N/A;   INGUINAL HERNIA REPAIR     JOINT REPLACEMENT     LEFT HEART CATH AND CORS/GRAFTS ANGIOGRAPHY N/A 06/27/2017   Procedure: LEFT HEART CATH AND CORS/GRAFTS ANGIOGRAPHY;  Surgeon: [name], MD;  Location: [facility];  Service: Cardiovascular;  Laterality: N/A;   NASAL SINUS SURGERY     SHOULDER OPEN ROTATOR CUFF REPAIR Right    TOTAL KNEE ARTHROPLASTY Bilateral      Social History   reports that he has never smoked. He has quit using smokeless tobacco.  His smokeless tobacco use included chew. He reports that he does not drink alcohol or use  drugs.   Family History   His family history includes Arthritis-Osteo in his sister; Heart attack in his brother and another family member. There is no history of Cancer.   Allergies Allergies  Allergen Reactions   Feldene [Piroxicam] Other (See Comments)    REACTION: Blisters   Statins Other (See Comments)    Myalgias   Doxycycline Other (See Comments)    REACTION:  unknown   Latex Rash   Tequin Anxiety   Valium Other (See Comments)    REACTION:  unknown     Home Medications  Prior to Admission medications   Medication Sig Start Date End Date Taking? Authorizing Provider  acetaminophen (TYLENOL) 500 MG tablet Take 1,000 mg by mouth every 6 (six) hours as needed for mild pain.   Yes [provider]  camphor-menthol Timoteo Ace) lotion Apply topically as needed for itching. Patient taking differently: Apply 1 application topically daily as needed for itching.  06/04/18  Yes Thurnell Lose, MD  cephALEXin (KEFLEX) 500 MG capsule Take 1 capsule (500 mg total) by mouth 4 (four) times daily. 07/12/18  Yes [name], MD  Cholecalciferol 2000 units TABS Take 1 tablet (2,000 Units total) by mouth daily. 02/04/16  Yes [name], MD  enoxaparin (LOVENOX) 80 MG/0.8ML injection Inject 0.8 mLs (80 mg total) into the skin every 12 (twelve) hours. 06/05/18  Yes Thurnell Lose, MD  ENSURE (ENSURE) Take 237 mLs by mouth every other day.    Yes [provider]  FLUoxetine (PROZAC) 20 MG tablet Take 20 mg by mouth daily.   Yes [provider]  furosemide (LASIX) 20 MG tablet Take 1 tablet (20 mg total) once a day. Take an extra tablet (20 mg total) for increased swelling, weight gain, or shortness of breath. 01/16/18  Yes Jettie Booze, MD  hydrOXYzine (ATARAX/VISTARIL) 25 MG tablet Take 1 tablet (25 mg total) by mouth every 8 (eight) hours as needed for itching. 06/04/18  Yes Thurnell Lose, MD  Lifitegrast Shirley Friar) 5 % SOLN Place 1-2 drops into both  eyes daily.    Yes [provider]  LORazepam (ATIVAN) 1 MG tablet Taking 1mg  in the AM, 0.5mg  (1/2 tablet) at lunch and 1/2 tablet (0.5mg ) at bedtime. 06/15/18  Yes [name], MD  magnesium oxide (MAG-OX) 400 (241.3 Mg) MG tablet Take 1 tablet (400 mg total) by mouth 2 (two) times daily. 04/02/18  Yes [name], MD  metoprolol succinate (TOPROL-XL) 25 MG 24 hr tablet TAKE ONE (1) TABLET BY MOUTH EVERY DAY 04/02/18  Yes [name], MD  nitroGLYCERIN (NITROSTAT) 0.4 MG SL tablet Place 1 tablet (0.4 mg total) under the tongue every  5 (five) minutes as needed for chest pain. 12/06/17  Yes Daune Perch, NP  pantoprazole (PROTONIX) 40 MG tablet Take one (1) tablet (40 mg) by mouth daily. Patient taking differently: Take 40 mg by mouth every other day.  02/21/17  Yes Jettie Booze, MD  predniSONE (DELTASONE) 20 MG tablet 3 tabs poqday 1-2, 2 tabs poqday 3-4, 1 tab poqday 5-6 Patient taking differently: Take 20-60 mg by mouth See admin instructions. 3 tabs poqday 1-2, 2 tabs poqday 3-4, 1 tab poqday 5-6 07/12/18  Yes Pickard, Cammie Mcgee, MD  rosuvastatin (CRESTOR) 5 MG tablet TAKE ONE (1) TABLET BY MOUTH EVERY DAY 06/14/18  Yes Jettie Booze, MD  tamsulosin (FLOMAX) 0.4 MG CAPS capsule TAKE 1 CAPSULE EVERY DAY 10/18/17  Yes [name], MD  predniSONE (DELTASONE) 5 MG tablet Label  & dispense according to the schedule below. take 8 Pills PO for 3 days, 6 Pills PO for 3 days, 4 Pills PO for 3 days, 2 Pills PO for 3 days, 1 Pills PO for 3 days, 1/2 Pill  PO for 3 days then STOP. Total 65 pills. Patient not taking: Reported on 07/14/2018 06/04/18   Thurnell Lose, MD      Baltazar Apo, MD, PhD 07/14/2018, 10:03 AM [facility] [phone] or if no answer [phone]

## 2018-07-14 NOTE — Progress Notes (Addendum)
STROKE TEAM PROGRESS NOTE   SUBJECTIVE (INTERVAL HISTORY) His RN is at the bedside.  Pt still in C-collar but stated no pain at neck or head. He is orientated and awake alert. Still has right hemiparesis and ataxia. BP slightly higher and on cleviprex. Passed swallow and will put on po meds.    OBJECTIVE Vitals:   07/14/18 0600 07/14/18 0700 07/14/18 0736 07/14/18 0800  BP: 133/85 (!) 149/90 133/84   Pulse: 72 78 66   Resp: 16 (!) 22 18   Temp:    (!) 97.4 F (36.3 C)  TempSrc:    Axillary  SpO2: 99% 99% 100%   Weight:      Height:        CBC:  Recent Labs  Lab 07/14/18 0536  WBC 7.5  NEUTROABS 5.2  HGB 11.7*  HCT 36.6*  MCV 96.1  PLT 134*    Basic Metabolic Panel:  Recent Labs  Lab 07/14/18 0536  NA 135  K 4.3  CL 103  CO2 19*  GLUCOSE 139*  BUN 20  CREATININE 1.52*  CALCIUM 9.2    Lipid Panel:     Component Value Date/Time   CHOL 75 02/03/2018 0524   CHOL 112 01/19/2018 0929   CHOL 97 09/09/2013 0957   TRIG 60 02/03/2018 0524   TRIG 92 09/09/2013 0957   HDL 28 (L) 02/03/2018 0524   HDL 35 (L) 01/19/2018 0929   HDL 37 (L) 09/09/2013 0957   CHOLHDL 2.7 02/03/2018 0524   VLDL 12 02/03/2018 0524   LDLCALC 35 02/03/2018 0524   LDLCALC 59 01/19/2018 0929   LDLCALC 42 09/09/2013 0957   HgbA1c:  Lab Results  Component Value Date   HGBA1C 5.3 12/02/2016   Urine Drug Screen:     Component Value Date/Time   LABOPIA NONE DETECTED 07/14/2018 0536   COCAINSCRNUR NONE DETECTED 07/14/2018 0536   LABBENZ POSITIVE (A) 07/14/2018 0536   AMPHETMU NONE DETECTED 07/14/2018 0536   THCU NONE DETECTED 07/14/2018 0536   LABBARB NONE DETECTED 07/14/2018 0536    Alcohol Level     Component Value Date/Time   ETH <10 07/14/2018 0536    IMAGING  CTA HEAD AND NECK  07/14/2018 IMPRESSION  1. Negative CTA for large vessel occlusion. No acute vascular abnormality seen underlying the left thalamic hemorrhage. No aneurysm.  2. Atheromatous change about the  carotid bifurcations with up to 50% narrowing on the right and 65% narrowing on the left.  3. Moderate diffuse carotid siphon atherosclerosis with moderate to advanced narrowing, left worse than right.  4. Fetal type origin of the PCAs with overall diminutive vertebrobasilar system.  5. Azygos ACA.  6. Mildly enlarged right paratracheal adenopathy, indeterminate, but could be reactive.   CT MAXILLOFACIAL  IMPRESSION  1. Soft tissue contusion with superimposed 3.4 cm hematoma at the right anterior face/neck.  2. No other acute maxillofacial injury.  No fracture.  3. Chronic left maxillary sinusitis.   CT CERVICAL SPINE  IMPRESSION  1. No acute traumatic injury within the cervical spine.  2. Moderate cervical spondylolysis at C3-4 through C6-7, with resultant moderate to severe spinal stenosis at C4-5.   Ct Head Code Stroke Wo Contrast 07/14/2018 IMPRESSION:  1. Acute intraparenchymal hemorrhage centered at the left thalamus, estimated volume 2.7 cc. Mild surrounding edema without significant regional mass effect.  2. Underlying age-related cerebral atrophy with moderate chronic small vessel ischemic disease.   EKG -  SR rate 80 BPM. (See cardiology reading for complete  details)    PHYSICAL EXAM  Temp:  [97.4 F (36.3 C)-97.5 F (36.4 C)] 97.4 F (36.3 C) (04/11 0800) Pulse Rate:  [64-78] 66 (04/11 1000) Resp:  [13-22] 22 (04/11 1000) BP: (100-185)/(52-137) 112/54 (04/11 1000) SpO2:  [95 %-100 %] 95 % (04/11 1000) Weight:  [79.8 kg] 79.8 kg (04/11 0538)  General - Well nourished, well developed, in no apparent distress, in C-collar.  Ophthalmologic - fundi not visualized due to noncooperation.  Cardiovascular - Regular rate and rhythm.  Mental Status -  Level of arousal and orientation to time, place, and person were intact. Language including expression, naming, repetition, comprehension was assessed and found intact.  Cranial Nerves II - XII - II - Visual field  intact OU. III, IV, VI - Extraocular movements intact. V - Facial sensation intact bilaterally. VII - Facial movement intact bilaterally. VIII - Hearing & vestibular intact bilaterally. X - Palate elevates symmetrically. XI - Chin turning & shoulder shrug intact bilaterally. XII - Tongue protrusion intact.  Motor Strength - The patient's strength was normal in LUE and LLE, however, RUE proximal 3/5, bicep and tricep 4+/5 and hand grip 3/5. RLE proximal 4/5 and distal 3/5.  Bulk was normal and fasciculations were absent.   Motor Tone - Muscle tone was assessed at the neck and appendages and was normal.  Reflexes - The patient's reflexes were symmetrical in all extremities and he had no pathological reflexes.  Sensory - Light touch, temperature/pinprick were assessed and were symmetrical.    Coordination - significant ataxia on the RUE and RLE, out of proportion to the weakness.  Tremor was absent.  Gait and Station - deferred.    ASSESSMENT/PLAN Evan [name] is a 85 y.o. male with history of adenocarcinoma of the lung in remission, Htn, Hld, Hemochromatosis, CAD with stent, atrial fibrillation and PE on therapeutic Lovenox (failed eliquis), and hearing impaired found on the floor by family with right hemiparesis. He did not receive IV t-PA due to [place].  ICH: Acute small ICH at the left thalamus, likely hypertensive in the setting of therapeutic lovenox  Resultant  Right hemiparesis and ataxia  CT head - Acute intraparenchymal hemorrhage centered at the left thalamus, estimated volume 2.7 cc. Mild surrounding edema without significant regional mass effect.   CTA H&N - Atheromatous change about the carotid bifurcations with up to 50% narrowing on the right and 65% narrowing on the left. Moderate athero b/l siphon, L>R. overall diminutive vertebrobasilar system.   CT repeat pending  2D Echo - 04/2018 - EF 60-65%  LDL - not performed  HgbA1c - not performed  UDS -  benzodiazepines (on home ativan)  VTE prophylaxis - SCDs  Lovenox 80 mg [place] Q12 hrs prior to admission, now on No antithrombotic  Ongoing aggressive stroke risk factor management  Therapy recommendations:  pending  Disposition:  Pending  PAF Hx of PE  Currently rate controlled  On levonox therapeutic dose PTA  LE venous doppler pending  Currently on no antithrombotics due to [place]  S/p fall  Facial contusion   On C-collar  C-collar clearance as per CCM  CCM assist appreciated  Hypertension  Stable (cleviprex) . SBP goal < 140 mm Hg . Resume home BP meds - metoprolol, lasix . On cleviprex . Wean off cleviprex as able . Long-term BP goal normotensive  Hyperlipidemia  Lipid lowering medication PTA: Crestor 5 mg daily  Current lipid lowering medication: resume crestor  Continue statin at discharge  Other Stroke Risk  Factors  Advanced age  Coronary artery disease  Other Active Problems  Fall with SDH in 09/2016  Mildly enlarged right paratracheal adenopathy, indeterminate, but could be reactive. Adenocarcinoma of the lung in remission.  Creatinine 1.52  Thrombocytopenia - 134   Hospital day # 0  This patient is critically ill due to [place], PAF with PE on AC, hypertension and at significant risk of neurological worsening, death form hematoma expansion, stroke, heart failure, hypertensive encephalopathy. This patient's care requires constant monitoring of vital signs, hemodynamics, respiratory and cardiac monitoring, review of multiple databases, neurological assessment, discussion with family, other specialists and medical decision making of high complexity. I spent 30 minutes of neurocritical care time in the care of this patient. I have discussed with Dr. Lamonte Sakai.   Rosalin Hawking, MD PhD Stroke Neurology 07/14/2018 10:33 AM    To contact Stroke Continuity provider, please refer to [email or web address] After hours, contact General Neurology

## 2018-07-14 NOTE — ED Notes (Signed)
ACTIVATED CODE STROKE--Evan [name]

## 2018-07-14 NOTE — ED Notes (Addendum)
Vilinda Flake - daughter [phone] Parked outside here to give information on pt meds since pt is altered

## 2018-07-14 NOTE — Progress Notes (Signed)
BP continues to be higher than parameters with cleviprex, MD notified

## 2018-07-14 NOTE — ED Notes (Signed)
ED TO INPATIENT HANDOFF REPORT  ED Nurse Name and Phone #: [phone]  S Name/Age/Gender Evan [name] 85 y.o. male Room/Bed: TRAAC/TRAAC  Code Status   Code Status: Full Code  Home/SNF/Other Home Patient oriented to: self and place Is this baseline? Yes   Triage Complete: Triage complete  Chief Complaint fall  Triage Note Pt transported from home after fall from bed @ 0200, pt placed back in bed by family, @ [phone] pt called out to family c/o numbenss/pain to R side. Large hematoma noted to R side of neck,. ccollar in place.  #20 L FA,    Allergies Allergies  Allergen Reactions  . Feldene [Piroxicam] Other (See Comments)    REACTION: Blisters  . Statins Other (See Comments)    Myalgias  . Doxycycline Other (See Comments)    REACTION:  unknown  . Latex Rash  . Tequin Anxiety  . Valium Other (See Comments)    REACTION:  unknown    Level of Care/Admitting Diagnosis ED Disposition    ED Disposition Condition [facility]: [place] [[id number]]  Level of Care: ICU [6]  Diagnosis: ICH (intracerebral hemorrhage) [facility]) [[id number]]  Admitting Physician: Amie Portland [[id number]]  Attending Physician: Amie Portland [[id number]]  Estimated length of stay: 5 - 7 days  Certification:: I certify this patient will need inpatient services for at least 2 midnights  PT Class (Do Not Modify): Inpatient [101]  PT Acc Code (Do Not Modify): Private [1]       B Medical/Surgery History Past Medical History:  Diagnosis Date  . Adenocarcinoma of right lung ([place]) 01/06/2016  . Anginal chest pain at rest [facility])    Chronic non, controlled on Lorazepam  . Anxiety   . Arthritis    "all over"   . BPH (benign prostatic hyperplasia)   . CAD (coronary artery disease)   . Chronic lower back pain   . Complication of anesthesia    "problems making water afterwards"  . Depression   . DJD (degenerative joint disease)   . Esophageal ulcer without bleeding    bid ppi  indefinitely  . Family history of adverse reaction to anesthesia    "children w/PONV"  . GERD (gastroesophageal reflux disease)   . Headache    "couple/week maybe" (09/04/2015)  . Hemochromatosis    Possible, elevated iron stores, hook-like osteophytes on hand films, normal LFTs  . Hepatitis    "yellow jaundice as a baby"  . History of ASCVD    MULTIVESSEL  . Hyperlipidemia   . Hypertension   . Myocardial infarction ([place]) 1999  . Osteoarthritis   . Pneumonia    "several times; got a little now" (09/04/2015)  . Rheumatoid arthritis ([place])   . Subdural hematoma ([facility])    small after fall 09/2016-plavix held, neurosurgery consulted.  Asymptomatic.    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 ARM SKIN LESION BIOPSY / EXCISION Left 09/02/2015  . CATARACT EXTRACTION W/ INTRAOCULAR LENS  IMPLANT, BILATERAL Bilateral   . CHOLECYSTECTOMY OPEN    . CORNEAL TRANSPLANT Bilateral    "one at [facility]; one at [facility]"  . CORONARY ANGIOPLASTY WITH STENT PLACEMENT    . CORONARY ARTERY BYPASS GRAFT  1999  . DESCENDING AORTIC ANEURYSM REPAIR W/ STENT     DE stent ostium into the right radial free graft at OM-, 02-2007  . ESOPHAGOGASTRODUODENOSCOPY  N/A 11/09/2014   Procedure: ESOPHAGOGASTRODUODENOSCOPY (EGD);  Surgeon: [name], MD;  Location: [facility] [place] [facility] ENDOSCOPY;  Service: Endoscopy;  Laterality: N/A;  . INGUINAL HERNIA REPAIR    . JOINT REPLACEMENT    . LEFT HEART CATH AND CORS/GRAFTS ANGIOGRAPHY N/A 06/27/2017   Procedure: LEFT HEART CATH AND CORS/GRAFTS ANGIOGRAPHY;  Surgeon: [name], MD;  Location: [place] CV LAB;  Service: Cardiovascular;  Laterality: N/A;  . NASAL SINUS SURGERY    . SHOULDER OPEN ROTATOR CUFF REPAIR Right   . TOTAL KNEE ARTHROPLASTY Bilateral      A IV Location/Drains/Wounds Patient Lines/Drains/Airways Status   Active Line/Drains/Airways    Name:   Placement date:   Placement  time:   Site:   Days:   Peripheral IV 07/14/18 Left Forearm   07/14/18    0530    Forearm   less than 1   Peripheral IV 07/14/18 Right Antecubital   07/14/18    0615    Antecubital   less than 1   External Urinary Catheter   07/14/18    8099    -   less than 1   Wound / Incision (Open or Dehisced) 06/02/18 Other (Comment) Arm Right   06/02/18    1816    Arm   42          Intake/Output Last 24 hours  Intake/Output Summary (Last 24 hours) at 07/14/2018 0726 Last data filed at 07/14/2018 8338 Gross per 24 hour  Intake -  Output 200 ml  Net -200 ml    Labs/Imaging Results for orders placed or performed during the hospital encounter of 07/14/18 (from the past 48 hour(s))  Ethanol     Status: None   Collection Time: 07/14/18  5:36 AM  Result Value Ref Range   Alcohol, Ethyl (B) <10 <10 mg/dL    Comment: (NOTE) Lowest detectable limit for serum alcohol is 10 mg/dL. For medical purposes only. Performed at [facility], [place] [address], [place], [place] [address]   Protime-INR     Status: None   Collection Time: 07/14/18  5:36 AM  Result Value Ref Range   Prothrombin Time 14.2 11.4 - 15.2 seconds   INR 1.1 0.8 - 1.2    Comment: (NOTE) INR goal varies based on device and disease states. Performed at [facility], [place] [address] [place], [place] [address]   APTT     Status: Abnormal   Collection Time: 07/14/18  5:36 AM  Result Value Ref Range   aPTT 41 (H) 24 - 36 seconds    Comment:        IF BASELINE aPTT IS ELEVATED, SUGGEST PATIENT RISK ASSESSMENT BE USED TO DETERMINE APPROPRIATE ANTICOAGULANT THERAPY. Performed at [facility], [place] [address] [place], [place] [address]   CBC     Status: Abnormal   Collection Time: 07/14/18  5:36 AM  Result Value Ref Range   WBC 7.5 4.0 - 10.5 K/uL   RBC 3.81 (L) 4.22 - 5.81 MIL/uL   Hemoglobin 11.7 (L) 13.0 - 17.0 g/dL   HCT 36.6 (L) 39.0 - 52.0 %   MCV 96.1 80.0 - 100.0 fL   MCH 30.7 26.0 - 34.0 pg   MCHC 32.0  30.0 - 36.0 g/dL   RDW 13.9 11.5 - 15.5 %   Platelets 134 (L) 150 - 400 K/uL   nRBC 0.0 0.0 - 0.2 %    Comment: Performed at [facility], [address] [place], [place] [address]  Differential     Status: Abnormal   Collection Time: 07/14/18  5:36 AM  Result Value Ref Range   Neutrophils Relative % 70 %   Neutro Abs 5.2 1.7 - 7.7 K/uL   Lymphocytes Relative 15 %   Lymphs Abs 1.1 0.7 - 4.0 K/uL   Monocytes Relative 15 %   Monocytes Absolute 1.1 (H) 0.1 - 1.0 K/uL   Eosinophils Relative 0 %   Eosinophils Absolute 0.0 0.0 - 0.5 K/uL   Basophils Relative 0 %   Basophils Absolute 0.0 0.0 - 0.1 K/uL   Immature Granulocytes 0 %   Abs Immature Granulocytes 0.03 0.00 - 0.07 K/uL    Comment: Performed at [place] [facility] [address], [place], [place] [address]  Comprehensive metabolic panel     Status: Abnormal   Collection Time: 07/14/18  5:36 AM  Result Value Ref Range   Sodium 135 135 - 145 mmol/L   Potassium 4.3 3.5 - 5.1 mmol/L   Chloride 103 98 - 111 mmol/L   CO2 19 (L) 22 - 32 mmol/L   Glucose, Bld 139 (H) 70 - 99 mg/dL   BUN 20 8 - 23 mg/dL   Creatinine, Ser 1.52 (H) 0.61 - 1.24 mg/dL   Calcium 9.2 8.9 - 10.3 mg/dL   Total Protein 6.2 (L) 6.5 - 8.1 g/dL   Albumin 3.3 (L) 3.5 - 5.0 g/dL   AST 29 15 - 41 U/L   ALT 16 0 - 44 U/L   Alkaline Phosphatase 113 38 - 126 U/L   Total Bilirubin 0.2 (L) 0.3 - 1.2 mg/dL   GFR calc non Af Amer 40 (L) >60 mL/min   GFR calc Af Amer 47 (L) >60 mL/min   Anion gap 13 5 - 15    Comment: Performed at [facility], [place] [address], [place], [place] [address]  Urine rapid drug screen (hosp performed)     Status: Abnormal   Collection Time: 07/14/18  5:36 AM  Result Value Ref Range   Opiates NONE DETECTED NONE DETECTED   Cocaine NONE DETECTED NONE DETECTED   Benzodiazepines POSITIVE (A) NONE DETECTED   Amphetamines NONE DETECTED NONE DETECTED   Tetrahydrocannabinol NONE DETECTED NONE DETECTED   Barbiturates NONE DETECTED  NONE DETECTED    Comment: (NOTE) DRUG SCREEN FOR MEDICAL PURPOSES ONLY.  IF CONFIRMATION IS NEEDED FOR ANY PURPOSE, NOTIFY LAB WITHIN 5 DAYS. LOWEST DETECTABLE LIMITS FOR URINE DRUG SCREEN Drug Class                     Cutoff (ng/mL) Amphetamine and metabolites    1000 Barbiturate and metabolites    200 Benzodiazepine                 527 Tricyclics and metabolites     300 Opiates and metabolites        300 Cocaine and metabolites        300 THC                            50 Performed at [facility], [place] [address], [place], [place] [address]   Urinalysis, Routine w reflex microscopic     Status: Abnormal   Collection Time: 07/14/18  5:36 AM  Result Value Ref Range   Color, Urine STRAW (A) YELLOW   APPearance CLEAR CLEAR   Specific Gravity, Urine  1.005 1.005 - 1.030   pH 7.0 5.0 - 8.0   Glucose, UA NEGATIVE NEGATIVE mg/dL   Hgb urine dipstick NEGATIVE NEGATIVE   Bilirubin Urine NEGATIVE NEGATIVE   Ketones, ur NEGATIVE NEGATIVE mg/dL   Protein, ur NEGATIVE NEGATIVE mg/dL   Nitrite NEGATIVE NEGATIVE   Leukocytes,Ua NEGATIVE NEGATIVE    Comment: Performed at [facility] [address], [place], [place] [address]   Ct Head Code Stroke Wo Contrast  Result Date: 07/14/2018 CLINICAL DATA:  Code stroke.  Initial evaluation for numbness. EXAM: CT HEAD WITHOUT CONTRAST TECHNIQUE: Contiguous axial images were obtained from the base of the skull through the vertex without intravenous contrast. COMPARISON:  Prior CT from 09/10/2016. FINDINGS: Brain: Acute intraparenchymal hemorrhage centered at the left thalamus measures 15 x 21 x 17 mm (estimated volume 2.7 cc). Mild surrounding low-density vasogenic edema without significant regional mass effect. No intraventricular extension or midline shift. No other acute intracranial hemorrhage. No other acute large vessel territory infarct. Underlying age-related cerebral atrophy with moderate chronic small vessel ischemic disease. Small  remote right cerebellar infarct noted. No mass lesion. No extra-axial fluid collection. Ventricles normal size without hydrocephalus. Vascular: No hyperdense vessel. Scattered vascular calcifications noted within the carotid siphons. Skull: Scalp soft tissues within normal limits.  Calvarium intact. Sinuses/Orbits: Globes and orbital soft tissues within normal limits. Chronic left maxillary sinusitis. Small left mastoid effusion, also chronic in appearance. Patient status post canal wall down mastoidectomy on the right. Other: None. IMPRESSION: 1. Acute intraparenchymal hemorrhage centered at the left thalamus, estimated volume 2.7 cc. Mild surrounding edema without significant regional mass effect. 2. Underlying age-related cerebral atrophy with moderate chronic small vessel ischemic disease. These results were communicated to Rory Percy at 6:04 amon 4/11/2020by text page via the [facility] messaging system. Electronically Signed   By: Jeannine Boga M.D.   On: 07/14/2018 06:07    Pending Labs Unresulted Labs (From admission, onward)   None      Vitals/Pain Today's Vitals   07/14/18 0537 07/14/18 0538 07/14/18 0600 07/14/18 0700  BP:   133/85 (!) 149/90  Pulse:   72 78  Resp:   16 (!) 22  Temp:      TempSrc:      SpO2:   99% 99%  Weight:  79.8 kg    Height:  5\' 5"  (1.651 m)    PainSc: 2        Isolation Precautions No active isolations  Medications Medications   stroke: mapping our early stages of recovery book (has no administration in time range)  acetaminophen (TYLENOL) tablet 650 mg (has no administration in time range)    Or  acetaminophen (TYLENOL) solution 650 mg (has no administration in time range)    Or  acetaminophen (TYLENOL) suppository 650 mg (has no administration in time range)  senna-docusate (Senokot-S) tablet 1 tablet (has no administration in time range)  pantoprazole (PROTONIX) injection 40 mg (has no administration in time range)  labetalol (NORMODYNE,TRANDATE)  injection 20 mg (20 mg Intravenous Given 07/14/18 0718)    And  clevidipine (CLEVIPREX) infusion 0.5 mg/mL (has no administration in time range)  iohexol (OMNIPAQUE) 350 MG/ML injection 75 mL (75 mLs Intravenous Contrast Given 07/14/18 7989)    Mobility walks High fall risk   Focused Assessments Neuro Assessment Handoff:  Swallow screen pass? No  Cardiac Rhythm: Atrial fibrillation       Neuro Assessment:   Neuro Checks:      Last Documented NIHSS Modified Score:  Has TPA been given? No   If patient is a Neuro Trauma and patient is going to OR before floor call report to [place] nurse: [phone] or [phone]     R Recommendations: See Admitting Provider Note  Report given to:   Additional Notes:

## 2018-07-14 NOTE — ED Notes (Signed)
This RN spoke with pts daughter, pt is a full code.

## 2018-07-14 NOTE — Plan of Care (Signed)
Elink:  Video chat with family

## 2018-07-14 NOTE — ED Triage Notes (Signed)
Pt transported from home after fall from bed @ 0200, pt placed back in bed by family, @ [phone] pt called out to family c/o numbenss/pain to R side. Large hematoma noted to R side of neck,. ccollar in place.  #20 L FA,

## 2018-07-14 NOTE — Progress Notes (Signed)
SLP Cancellation Note  Patient Details Name: Evan [name] MRN: [id number] DOB: [birth year]   Cancelled treatment:       Reason Eval/Treat Not Completed: Patient at procedure or test/unavailable. Per RN pt needing to be cleaned up; SLP to reattempt cognitive assessment as schedule allows.  Deneise Lever, [place], CCC-SLP Speech-Language Pathologist Acute Rehabilitation Services Pager: [phone] Office: [phone]    Aliene Altes 07/14/2018, 3:11 PM

## 2018-07-14 NOTE — H&P (Signed)
Neurology Consultation CC: Right-sided weakness  History is obtained from: Chart, family  HPI: Evan [name] is a 85 y.o. male past medical history of adenocarcinoma of the lung in remission, atrial fibrillation, PE, on therapeutic Lovenox with twice daily dosing, presented to the emergency room and family found him down on the floor having fallen from bed. He was last normal 9 p.m. on 07/13/2018. Received last dose of Lovenox 8 PM on 07/13/2018. He went to bed.  Had a fall in the morning around 2 AM.  Family helped him to bed but then noticed that he was having a difficult time moving the right side and that is why brought him to the hospital. Evaluated in the emergency room, code stroke activated after the EDP called me with concerns for stroke. I saw him in the CT scanner. Initial examination was very difficult as he did not have his hearing aids on for the CT scan but he obviously had some right-sided hemiparesis. Detailed examination below.  LKW: 9 PM on 07/13/2018 tpa given?: no, ICH Premorbid modified Rankin scale (mRS): 2 ROS: Difficult to perform due to patient's inability to hear well  Past Medical History:  Diagnosis Date  . Adenocarcinoma of right lung ([place]) 01/06/2016  . Anginal chest pain at rest [facility])    Chronic non, controlled on Lorazepam  . Anxiety   . Arthritis    "all over"   . BPH (benign prostatic hyperplasia)   . CAD (coronary artery disease)   . Chronic lower back pain   . Complication of anesthesia    "problems making water afterwards"  . Depression   . DJD (degenerative joint disease)   . Esophageal ulcer without bleeding    bid ppi indefinitely  . Family history of adverse reaction to anesthesia    "children w/PONV"  . GERD (gastroesophageal reflux disease)   . Headache    "couple/week maybe" (09/04/2015)  . Hemochromatosis    Possible, elevated iron stores, hook-like osteophytes on hand films, normal LFTs  . Hepatitis    "yellow jaundice as a baby"  .  History of ASCVD    MULTIVESSEL  . Hyperlipidemia   . Hypertension   . Myocardial infarction ([place]) 1999  . Osteoarthritis   . Pneumonia    "several times; got a little now" (09/04/2015)  . Rheumatoid arthritis ([place])   . Subdural hematoma ([facility])    small after fall 09/2016-plavix held, neurosurgery consulted.  Asymptomatic.    Marland Kitchen 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[place]) 02/2018   on Lovenox lifelong since he failed PO Eliquis    Family History  Problem Relation Age of Onset  . Arthritis-Osteo Sister   . Heart attack Brother   . Heart attack Other   . Cancer Neg Hx    Social History:   reports that he has never smoked. He has quit using smokeless tobacco.  His smokeless tobacco use included chew. He reports that he does not drink alcohol or use drugs.  Medications No current facility-administered medications for this encounter.   Current Outpatient Medications:  .  acetaminophen (TYLENOL) 500 MG tablet, Take 1,000 mg by mouth every 6 (six) hours as needed for mild pain., Disp: , Rfl:  .  camphor-menthol (SARNA) lotion, Apply topically as needed for itching., Disp: 222 mL, Rfl: 0 .  cephALEXin (KEFLEX) 500 MG capsule, Take 1 capsule (500 mg total) by mouth 4 (four) times daily., Disp: 28 capsule, Rfl: 0 .  Cholecalciferol 2000 units TABS, Take 1 tablet (2,000 Units total) by  mouth daily., Disp: 30 each, Rfl: 11 .  enoxaparin (LOVENOX) 80 MG/0.8ML injection, Inject 0.8 mLs (80 mg total) into the skin every 12 (twelve) hours., Disp: , Rfl:  .  ENSURE (ENSURE), Take 237 mLs by mouth 2 (two) times daily between meals., Disp: , Rfl:  .  FLUoxetine (PROZAC) 20 MG tablet, Take 20 mg by mouth daily., Disp: , Rfl:  .  furosemide (LASIX) 20 MG tablet, Take 1 tablet (20 mg total) once a day. Take an extra tablet (20 mg total) for increased swelling, weight gain, or shortness of breath., Disp: 180 tablet, Rfl: 3 .  hydrOXYzine (ATARAX/VISTARIL) 25 MG tablet, Take 1 tablet (25 mg total) by mouth every 8 (eight) hours  as needed for itching., Disp: 20 tablet, Rfl: 0 .  Lifitegrast (XIIDRA) 5 % SOLN, Place 1-2 drops into both eyes daily. , Disp: , Rfl:  .  LORazepam (ATIVAN) 1 MG tablet, Taking 1mg  in the AM, 0.5mg  (1/2 tablet) at lunch and 1/2 tablet (0.5mg ) at bedtime., Disp: 90 tablet, Rfl: 0 .  magnesium oxide (MAG-OX) 400 (241.3 Mg) MG tablet, Take 1 tablet (400 mg total) by mouth 2 (two) times daily., Disp: 180 tablet, Rfl: 3 .  metoprolol succinate (TOPROL-XL) 25 MG 24 hr tablet, TAKE ONE (1) TABLET BY MOUTH EVERY DAY, Disp: 90 tablet, Rfl: 3 .  mupirocin ointment (BACTROBAN) 2 %, Apply 1 application topically 2 (two) times daily., Disp: , Rfl:  .  nitroGLYCERIN (NITROSTAT) 0.4 MG SL tablet, Place 1 tablet (0.4 mg total) under the tongue every 5 (five) minutes as needed for chest pain., Disp: 25 tablet, Rfl: 2 .  pantoprazole (PROTONIX) 40 MG tablet, Take one (1) tablet (40 mg) by mouth daily., Disp: 90 tablet, Rfl: 3 .  predniSONE (DELTASONE) 20 MG tablet, 3 tabs poqday 1-2, 2 tabs poqday 3-4, 1 tab poqday 5-6, Disp: 12 tablet, Rfl: 0 .  predniSONE (DELTASONE) 5 MG tablet, Label  & dispense according to the schedule below. take 8 Pills PO for 3 days, 6 Pills PO for 3 days, 4 Pills PO for 3 days, 2 Pills PO for 3 days, 1 Pills PO for 3 days, 1/2 Pill  PO for 3 days then STOP. Total 65 pills., Disp: 65 tablet, Rfl: 0 .  rosuvastatin (CRESTOR) 5 MG tablet, TAKE ONE (1) TABLET BY MOUTH EVERY DAY, Disp: 90 tablet, Rfl: 3 .  tamsulosin (FLOMAX) 0.4 MG CAPS capsule, TAKE 1 CAPSULE EVERY DAY, Disp: 90 capsule, Rfl: 3  Exam: Current vital signs: BP 100/67 (BP Location: Right Arm)   Pulse 74   Temp (!) 97.5 F (36.4 C) (Oral)   Resp 16   Ht 5\' 5"  (1.651 m)   Wt 79.8 kg   SpO2 100%   BMI 29.29 kg/m  Vital signs in last 24 hours: Temp:  [97.5 F (36.4 C)] 97.5 F (36.4 C) (04/11 0536) Pulse Rate:  [74] 74 (04/11 0536) Resp:  [16] 16 (04/11 0536) BP: (100)/(67) 100/67 (04/11 0536) SpO2:  [99 %-100 %] 100  % (04/11 0536) Weight:  [79.8 kg] 79.8 kg (04/11 0538) Gen: Awake, alert HEENT: C-collar in place.  There is bruising below the right jaw that I could see through the c-collar.  Denies any frank pain on palpation of the jaw. CVS: S1-S2 regular rate rhythm Lungs: Clear to auscultation Extremities: Warm well perfused.  Right forearm in bandage from a prior wound. Neurological exam Extremely limited exam due to patient's severely reduced auditory acuity. He is awake  alert oriented to self.  Was able to tell me his date of birth. He could tell me that he was in the hospital. His speech is not dysarthric. Follows commands but very difficult to communicate due to extremely decreased auditory acuity Cranial nerves: Pupils are equal round reactive to light, extraocular movements are intact, blinks to threat from both sides, difficult ascertain facial symmetry due to c-collar in place. Motor exam: Has drift in both right upper and lower extremity.  No drift in left upper and lower extremity. Sensory exam: Withdraws to noxious stimulation in all 4 Difficult to assess coordination but is dystaxic in the right upper and lower extremity. NIH stroke scale-3   Labs I have reviewed labs in epic and the results pertinent to this consultation are:  CBC    Component Value Date/Time   WBC 7.1 06/03/2018 0721   RBC 3.62 (L) 06/03/2018 0721   HGB 11.2 (L) 06/03/2018 0721   HGB 13.4 08/16/2016 1155   HCT 34.0 (L) 06/03/2018 0721   HCT 39.8 08/16/2016 1155   PLT 63 (L) 06/03/2018 0721   PLT 81 (L) 08/16/2016 1155   MCV 93.9 06/03/2018 0721   MCV 92.3 08/16/2016 1155   MCH 30.9 06/03/2018 0721   MCHC 32.9 06/03/2018 0721   RDW 13.7 06/03/2018 0721   RDW 13.3 08/16/2016 1155   LYMPHSABS 0.7 06/01/2018 1051   LYMPHSABS 1.3 08/16/2016 1155   MONOABS 0.8 06/01/2018 1051   MONOABS 1.2 (H) 08/16/2016 1155   EOSABS 0.1 06/01/2018 1051   EOSABS 0.1 08/16/2016 1155   BASOSABS 0.0 06/01/2018 1051    BASOSABS 0.0 08/16/2016 1155    CMP     Component Value Date/Time   NA 136 06/03/2018 0721   NA 143 08/16/2016 1155   K 4.7 06/03/2018 0721   K 4.3 08/16/2016 1155   CL 111 06/03/2018 0721   CO2 17 (L) 06/03/2018 0721   CO2 24 08/16/2016 1155   GLUCOSE 144 (H) 06/03/2018 0721   GLUCOSE 88 08/16/2016 1155   GLUCOSE 120 (H) 04/20/2015 1600   BUN 19 06/03/2018 0721   BUN 14.6 08/16/2016 1155   CREATININE 1.43 (H) 06/03/2018 0721   CREATININE 1.35 (H) 04/20/2018 1048   CREATININE 1.2 08/16/2016 1155   CALCIUM 8.2 (L) 06/03/2018 0721   CALCIUM 9.0 08/16/2016 1155   PROT 5.3 (L) 06/03/2018 0721   PROT 6.7 08/16/2016 1155   ALBUMIN 2.7 (L) 06/03/2018 0721   ALBUMIN 3.6 08/16/2016 1155   AST 21 06/03/2018 0721   AST 18 08/16/2016 1155   ALT 17 06/03/2018 0721   ALT 10 08/16/2016 1155   ALKPHOS 66 06/03/2018 0721   ALKPHOS 101 08/16/2016 1155   BILITOT 0.7 06/03/2018 0721   BILITOT 1.26 (H) 08/16/2016 1155   GFRNONAA 43 (L) 06/03/2018 0721   GFRNONAA 47 (L) 04/20/2018 1048   GFRAA 50 (L) 06/03/2018 0721   GFRAA 54 (L) 04/20/2018 1048    Imaging I have reviewed the images obtained CT head shows a small volume left thalamic bleed with no intraventricular extension or midline shift.  CTA head and neck along with CT maxillofacial negative for large vessel occlusion Fetal PCAs Azygous ACA Atheromatous changes in bilateral carotid bifurcations with up to 50% narrowing on the right and 65% narrowing on the left CT maxillofacial-soft tissue contusion with superimposed 3.4 cm hematoma at the right anterior face or neck.  Assessment: 85 year old man past history of adenocarcinoma of the lung in remission, atrial fibrillation on therapeutic Lovenox,  history of PE, presenting to the emergency room after having a fall from the bed. Last known normal 9 PM on 07/13/2018 when he went to bed On examination has mild right hemiparesis and right-sided dystaxia. Exam is limited due to his  decreased auditory acuity. CT scan of the head showed a left thalamic bleed-likely secondary to coagulopathy versus hypertension. Last dose of Lovenox was last night at 8 PM.  Due to the small size of the bleed and minimal deficit on exam, no reversal was initiated.  Impression: ICH- hypertension versus coagulopathy as an underlying etiology  Right anterior face/neck hematoma  Assessment:   Plan: Subcortical ICH, nontraumatic Acuity: Acute Laterality: left thalamus Treatment: -Admit to NICU -ICH Score: 1 -ICH Volume: <30cc -BP control goal SYS<140 -PT/OT/ST  -neuromonitoring  CNS -Close neuro monitoring  Dysarthria Dysphagia following ICH  -NPO until cleared by speech -ST  Hemiplegia and hemiparesis following nontraumatic intracerebral hemorrhage affecting right dominant side  -Continue PT/OT/ST  RESP H/O Lung Ca Oncology if needed  CV Essential (primary) hypertension Hypertensive Urgency -Aggressive BP control, goal SBP < 140 -Labetalol/Cleviprex  -TTE -Paroxysmal A. fib-hold anticoagulation for now  GI/GU AKI on CKD? Gentle hydration  HEME Coagulopathy from enoxaparin -No reversal at this time -Repeat CT head in 6 hours-might need reversal if this bleed size increases.  Iron deficiency anemia Monitor for now  ENDO -goal HgbA1c < 7  Fluid/Electrolyte Disorders Check labs and replete as necessary.  ID Possible Aspiration PNA -CXR -NPO -Monitor  Right anterior face/neck hematoma -Was found down -C-collar in place. -I would consult trauma or PCCM services for clearing the neck   Prophylaxis DVT: scd GI: PPI Bowel: doc senna  Dispo: TBD  Diet: NPO until cleared by speech  Code Status: Full Code  - confirmed over phone by family.  Chart said he had a MOLST form but family said full code.   THE FOLLOWING WERE PRESENT ON ADMISSION: ICH Facial trauma History of lung cancer CKD  -- Amie Portland, MD [facility] Pager:  [phone] If 7pm to 7am, please call on call as listed on AMION.   CRITICAL CARE ATTESTATION Performed by: Amie Portland, MD Total critical care time: 55 minutes Critical care time was exclusive of separately billable procedures and treating other patients and/or supervising APPs/Residents/Students Critical care was necessary to treat or prevent imminent or life-threatening deterioration due to [place] This patient is critically ill and at significant risk for neurological worsening and/or death and care requires constant monitoring. Critical care was time spent personally by me on the following activities: development of treatment plan with patient and/or surrogate as well as nursing, discussions with consultants, evaluation of patient's response to treatment, examination of patient, obtaining history from patient or surrogate, ordering and performing treatments and interventions, ordering and review of laboratory studies, ordering and review of radiographic studies, pulse oximetry, re-evaluation of patient's condition, participation in multidisciplinary rounds and medical decision making of high complexity in the care of this patient.

## 2018-07-14 NOTE — Progress Notes (Signed)
PT Cancellation Note  Patient Details Name: ARUN HERROD MRN: [id number] DOB: [birth year]   Cancelled Treatment:    Reason Eval/Treat Not Completed: Active bedrest order   Thanks,  Wells Guiles B. Ahmaya Ostermiller, PT, DPT  Acute Rehabilitation [phone] pager [phone]) [phone] office     Wells Guiles B Elmer Boutelle 07/14/2018, 7:05 AM

## 2018-07-14 NOTE — ED Provider Notes (Signed)
[facility] EMERGENCY DEPARTMENT Provider Note   CSN: [id number] Arrival date & time: 07/14/18  4098    History   Chief Complaint Chief Complaint  Patient presents with  . Fall    HPI Evan [name] is a 85 y.o. male.  The history is provided by the patient and the EMS personnel.  Fall   He has history of hypertension, hyperlipidemia, rheumatoid arthritis, pulmonary embolism, lung cancer and is anticoagulated on enoxaparin and comes in because of right-sided numbness.  He had fallen out of bed at 2 AM and family helped him back into bed.  At that time, they noted skin tears to his right upper arm.  He called out at about 430 and family noted that he was weak on the right and having right-sided numbness.  EMS also thought they saw focal seizure on the right side.  Patient does not remember anything that happened tonight.  Past Medical History:  Diagnosis Date  . Adenocarcinoma of right lung ([place]) 01/06/2016  . Anginal chest pain at rest [facility])    Chronic non, controlled on Lorazepam  . Anxiety   . Arthritis    "all over"   . BPH (benign prostatic hyperplasia)   . CAD (coronary artery disease)   . Chronic lower back pain   . Complication of anesthesia    "problems making water afterwards"  . Depression   . DJD (degenerative joint disease)   . Esophageal ulcer without bleeding    bid ppi indefinitely  . Family history of adverse reaction to anesthesia    "children w/PONV"  . GERD (gastroesophageal reflux disease)   . Headache    "couple/week maybe" (09/04/2015)  . Hemochromatosis    Possible, elevated iron stores, hook-like osteophytes on hand films, normal LFTs  . Hepatitis    "yellow jaundice as a baby"  . History of ASCVD    MULTIVESSEL  . Hyperlipidemia   . Hypertension   . Myocardial infarction ([place]) 1999  . Osteoarthritis   . Pneumonia    "several times; got a little now" (09/04/2015)  . Rheumatoid arthritis ([place])   . Subdural hematoma (H[facility])    small after fall 09/2016-plavix held, neurosurgery consulted.  Asymptomatic.    Marland Kitchen 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[place]) 02/2018   on Lovenox lifelong since he failed PO Eliquis    Patient Active Problem List   Diagnosis Date Noted  . Cellulitis of arm, right 06/01/2018  . Rash and nonspecific skin eruption 06/01/2018  . Chest pain 02/02/2018  . Thrombocytopenia ([place]) 02/02/2018  . Hypokalemia 02/02/2018  . Pulmonary embolism ([place]) 02/02/2018  . Elevated troponin 02/02/2018  . Pulmonary embolus ([place]) 02/02/2018  . Non-ST elevation MI (NSTEMI) ([place]) 06/27/2017  . Diastolic CHF ([place]) 11/91/4782  . Chest pain due to CAD ([place]) 06/26/2017  . Dyspnea 06/21/2017  . Acute bronchitis 05/15/2017  . Oral candidiasis 05/15/2017  . Acute pericarditis   . Chest pain at rest 12/20/2016  . Angina pectoris ([place]) 12/20/2016  . Radiation pneumonitis ([place]) 11/09/2016  . Pleurisy 10/11/2016  . Chest wall pain 10/11/2016  . Atrial fibrillation ([place]) 05/21/2016  . Adenocarcinoma of right lung ([place]) 01/06/2016  . GAD (generalized anxiety disorder) 11/20/2015  . Essential hypertension   . Cellulitis 09/04/2015  . Cellulitis of left axilla   . Esophageal ulcer without bleeding   . Bilateral lower extremity edema 04/28/2014  . BPH (benign prostatic hyperplasia) 10/08/2012  . Anxiety   . EKG abnormalities 09/05/2012  . Tremor 09/05/2012  .  Chronic fatigue 09/05/2012  . Arthritis   . Asthma, chronic   . Reflux esophagitis   . S/P CABG (coronary artery bypass graft)   . GERD (gastroesophageal reflux disease)   . Hyperlipidemia   . Hypertension   . UNSPECIFIED PERIPHERAL VASCULAR DISEASE 06/16/2009    Past Surgical History:  Procedure Laterality Date  . ARM SKIN LESION BIOPSY / EXCISION Left 09/02/2015  . CATARACT EXTRACTION W/ INTRAOCULAR LENS  IMPLANT, BILATERAL Bilateral   . CHOLECYSTECTOMY OPEN    . CORNEAL TRANSPLANT Bilateral    "one at [facility]; one at [facility]"  . CORONARY ANGIOPLASTY WITH STENT PLACEMENT     . CORONARY ARTERY BYPASS GRAFT  1999  . DESCENDING AORTIC ANEURYSM REPAIR W/ STENT     DE stent ostium into the right radial free graft at OM-, 02-2007  . ESOPHAGOGASTRODUODENOSCOPY N/A 11/09/2014   Procedure: ESOPHAGOGASTRODUODENOSCOPY (EGD);  Surgeon: [name], MD;  Location: [facility] ENDOSCOPY;  Service: Endoscopy;  Laterality: N/A;  . INGUINAL HERNIA REPAIR    . JOINT REPLACEMENT    . LEFT HEART CATH AND CORS/GRAFTS ANGIOGRAPHY N/A 06/27/2017   Procedure: LEFT HEART CATH AND CORS/GRAFTS ANGIOGRAPHY;  Surgeon: [name], MD;  Location: [facility] CV LAB;  Service: Cardiovascular;  Laterality: N/A;  . NASAL SINUS SURGERY    . SHOULDER OPEN ROTATOR CUFF REPAIR Right   . TOTAL KNEE ARTHROPLASTY Bilateral         Home Medications    Prior to Admission medications   Medication Sig Start Date End Date Taking? Authorizing Provider  acetaminophen (TYLENOL) 500 MG tablet Take 1,000 mg by mouth every 6 (six) hours as needed for mild pain.    [provider]  camphor-menthol Timoteo Ace) lotion Apply topically as needed for itching. 06/04/18   Thurnell Lose, MD  cephALEXin (KEFLEX) 500 MG capsule Take 1 capsule (500 mg total) by mouth 4 (four) times daily. 07/12/18   [name], MD  Cholecalciferol 2000 units TABS Take 1 tablet (2,000 Units total) by mouth daily. 02/04/16   [name], MD  enoxaparin (LOVENOX) 80 MG/0.8ML injection Inject 0.8 mLs (80 mg total) into the skin every 12 (twelve) hours. 06/05/18   Thurnell Lose, MD  ENSURE (ENSURE) Take 237 mLs by mouth 2 (two) times daily between meals.    [provider]  FLUoxetine (PROZAC) 20 MG tablet Take 20 mg by mouth daily.    [provider]  furosemide (LASIX) 20 MG tablet Take 1 tablet (20 mg total) once a day. Take an extra tablet (20 mg total) for increased swelling, weight gain, or shortness of breath. 01/16/18   Jettie Booze, MD  hydrOXYzine (ATARAX/VISTARIL) 25 MG tablet Take 1 tablet (25  mg total) by mouth every 8 (eight) hours as needed for itching. 06/04/18   Thurnell Lose, MD  Lifitegrast Shirley Friar) 5 % SOLN Place 1-2 drops into both eyes daily.     [provider]  LORazepam (ATIVAN) 1 MG tablet Taking 1mg  in the AM, 0.5mg  (1/2 tablet) at lunch and 1/2 tablet (0.5mg ) at bedtime. 06/15/18   [name], MD  magnesium oxide (MAG-OX) 400 (241.3 Mg) MG tablet Take 1 tablet (400 mg total) by mouth 2 (two) times daily. 04/02/18   [name], MD  metoprolol succinate (TOPROL-XL) 25 MG 24 hr tablet TAKE ONE (1) TABLET BY MOUTH EVERY DAY 04/02/18   [name], MD  mupirocin ointment (BACTROBAN) 2 % Apply 1 application topically 2 (  two) times daily. 05/30/18   [provider]  nitroGLYCERIN (NITROSTAT) 0.4 MG SL tablet Place 1 tablet (0.4 mg total) under the tongue every 5 (five) minutes as needed for chest pain. 12/06/17   Daune Perch, NP  pantoprazole (PROTONIX) 40 MG tablet Take one (1) tablet (40 mg) by mouth daily. 02/21/17   Jettie Booze, MD  predniSONE (DELTASONE) 20 MG tablet 3 tabs poqday 1-2, 2 tabs poqday 3-4, 1 tab poqday 5-6 07/12/18   [name], MD  predniSONE (DELTASONE) 5 MG tablet Label  & dispense according to the schedule below. take 8 Pills PO for 3 days, 6 Pills PO for 3 days, 4 Pills PO for 3 days, 2 Pills PO for 3 days, 1 Pills PO for 3 days, 1/2 Pill  PO for 3 days then STOP. Total 65 pills. 06/04/18   Thurnell Lose, MD  rosuvastatin (CRESTOR) 5 MG tablet TAKE ONE (1) TABLET BY MOUTH EVERY DAY 06/14/18   Jettie Booze, MD  tamsulosin [facility]) 0.4 MG CAPS capsule TAKE 1 CAPSULE EVERY DAY 10/18/17   [name], MD    Family History Family History  Problem Relation Age of Onset  . Arthritis-Osteo Sister   . Heart attack Brother   . Heart attack Other   . Cancer Neg Hx     Social History Social History   Tobacco Use  . Smoking status: Never Smoker  . Smokeless tobacco: Former Systems developer    Types:  Chew  . Tobacco comment: "quit chewing in the 1960s"  Substance Use Topics  . Alcohol use: No  . Drug use: No     Allergies   Feldene [piroxicam]; Statins; Doxycycline; Latex; Tequin; and Valium   Review of Systems Review of Systems  All other systems reviewed and are negative.    Physical Exam Updated Vital Signs BP 100/67 (BP Location: Right Arm)   Pulse 74   Temp (!) 97.5 F (36.4 C) (Oral)   Resp 16   Ht 5\' 5"  (1.651 m)   Wt 79.8 kg   SpO2 100%   BMI 29.29 kg/m   Physical Exam Vitals signs and nursing note reviewed.    85 year old male, resting comfortably and in no acute distress. Vital signs are normal. Oxygen saturation is 100%, which is normal. Head is normocephalic.  Pupils are 4 mm and status post lens implants bilaterally.  EOMs are full with the exception of incomplete lateral gaze bilaterally. Oropharynx is clear.  There is ecchymosis and soft tissue swelling along the right side of the mandible, but no malocclusion. Neck is immobilized in a stiff cervical collar and is nontender without adenopathy or JVD. Back is nontender and there is no CVA tenderness. Lungs are clear without rales, wheezes, or rhonchi. Chest is nontender. Heart has regular rate and rhythm without murmur. Abdomen is soft, flat, nontender without masses or hepatosplenomegaly and peristalsis is normoactive. Extremities have no cyanosis or edema, full range of motion is present.  Skin tears are present on the right upper arm and right forearm. Skin is warm and dry without rash. Neurologic: Awake, alert, oriented to person and place but poorly oriented to time, cranial nerves are intact except for bilateral 6th nerve palsies as noted above as well as decreased sensation on the right side of his face.  There is right arm weakness and right leg weakness with strength 4/5.  There is normal strength of the left arm and left leg.  There is a  right Babinski reflex present.  ED Treatments / Results   Labs (all labs ordered are listed, but only abnormal results are displayed) Labs Reviewed  APTT - Abnormal; Notable for the following components:      Result Value   aPTT 41 (*)    All other components within normal limits  CBC - Abnormal; Notable for the following components:   RBC 3.81 (*)    Hemoglobin 11.7 (*)    HCT 36.6 (*)    Platelets 134 (*)    All other components within normal limits  DIFFERENTIAL - Abnormal; Notable for the following components:   Monocytes Absolute 1.1 (*)    All other components within normal limits  URINALYSIS, ROUTINE W REFLEX MICROSCOPIC - Abnormal; Notable for the following components:   Color, Urine STRAW (*)    All other components within normal limits  PROTIME-INR  ETHANOL  COMPREHENSIVE METABOLIC PANEL  RAPID URINE DRUG SCREEN, HOSP PERFORMED    EKG EKG Interpretation  Date/Time:  Saturday July 14 2018 05:31:09 EDT Ventricular Rate:  80 PR Interval:    QRS Duration: 106 QT Interval:  395 QTC Calculation: 456 R Axis:   14 Text Interpretation:  Sinus rhythm Probable anteroseptal infarct, old Nonspecific T abnormalities, inferior leads When compared with ECG of 02/03/2018, No significant change was found Confirmed by Delora Fuel ([id number]) on 07/14/2018 5:40:01 AM   Radiology No results found.  Procedures Procedures  CRITICAL CARE Performed by: Delora Fuel Total critical care time: 45 minutes Critical care time was exclusive of separately billable procedures and treating other patients. Critical care was necessary to treat or prevent imminent or life-threatening deterioration. Critical care was time spent personally by me on the following activities: development of treatment plan with patient and/or surrogate as well as nursing, discussions with consultants, evaluation of patient's response to treatment, examination of patient, obtaining history from patient or surrogate, ordering and performing treatments and interventions, ordering  and review of laboratory studies, ordering and review of radiographic studies, pulse oximetry and re-evaluation of patient's condition.  Medications Ordered in ED Medications  iohexol (OMNIPAQUE) 350 MG/ML injection 75 mL (75 mLs Intravenous Contrast Given 07/14/18 6606)     Initial Impression / Assessment and Plan / ED Course  I have reviewed the triage vital signs and the nursing notes.  Pertinent labs & imaging results that were available during my care of the patient were reviewed by me and considered in my medical decision making (see chart for details).  Fall in a patient who is anticoagulated, with focal neurologic deficit.  When this was called in by EMS, it was felt that this represented trauma.  However, physical exam findings are actually more suggestive of stroke.  If it was traumatic, it would be a contrecoup injury.  Case was discussed with Dr. Rory Percy of neurology service, and decision was made to activate code stroke.  In addition to code stroke CT of head, he will also need CT of cervical spine and maxillofacial bones.  Last known normal was at 2 AM when family got him up from falling out of bed.  Old records are reviewed, and he does have history of subdural hematoma 2 years ago.  CT of head shows right thalamic hemorrhage with surrounding edema.  No acute injury is seen on CT of maxillofacial bones or cervical spine.  Labs do show mild anemia which is unchanged from baseline.  Case is discussed with Dr. Rory Percy of neurology service who agrees to admit the patient.  CT of cervical spine and maxillofacial bones show no evidence of fracture, but hematoma present along mandible as noted on physical exam.  CTA shows no evidence of large vessel occlusion.  Labs are significant for mild normochromic normocytic anemia, mild hyperglycemia, mild to moderate renal insufficiency.  None of those are significantly changed from baseline.  Final Clinical Impressions(s) / ED Diagnoses   Final  diagnoses:  Thalamic hemorrhage with stroke ([place])  Fall from bed, initial encounter  Skin tear of right upper arm without complication, initial encounter  Chronic anticoagulation  Normochromic normocytic anemia  Contusion of jaw, initial encounter  Renal insufficiency    ED Discharge Orders    None       Delora Fuel, MD 34/19/62 [phone]

## 2018-07-15 ENCOUNTER — Inpatient Hospital Stay (HOSPITAL_COMMUNITY): Payer: Medicare HMO

## 2018-07-15 DIAGNOSIS — I161 Hypertensive emergency: Secondary | ICD-10-CM

## 2018-07-15 DIAGNOSIS — H9193 Unspecified hearing loss, bilateral: Secondary | ICD-10-CM

## 2018-07-15 DIAGNOSIS — E785 Hyperlipidemia, unspecified: Secondary | ICD-10-CM

## 2018-07-15 DIAGNOSIS — I610 Nontraumatic intracerebral hemorrhage in hemisphere, subcortical: Secondary | ICD-10-CM

## 2018-07-15 LAB — BASIC METABOLIC PANEL
Anion gap: 10 (ref 5–15)
BUN: 18 mg/dL (ref 8–23)
CO2: 20 mmol/L — ABNORMAL LOW (ref 22–32)
Calcium: 8.5 mg/dL — ABNORMAL LOW (ref 8.9–10.3)
Chloride: 106 mmol/L (ref 98–111)
Creatinine, Ser: 1.61 mg/dL — ABNORMAL HIGH (ref 0.61–1.24)
GFR calc Af Amer: 44 mL/min — ABNORMAL LOW (ref >60)
GFR calc non Af Amer: 38 mL/min — ABNORMAL LOW (ref >60)
Glucose, Bld: 106 mg/dL — ABNORMAL HIGH (ref 70–99)
Potassium: 4 mmol/L (ref 3.5–5.1)
Sodium: 136 mmol/L (ref 135–145)

## 2018-07-15 LAB — CBC
HCT: 34.3 % — ABNORMAL LOW (ref 39.0–52.0)
Hemoglobin: 11.2 g/dL — ABNORMAL LOW (ref 13.0–17.0)
MCH: 31 pg (ref 26.0–34.0)
MCHC: 32.7 g/dL (ref 30.0–36.0)
MCV: 95 fL (ref 80.0–100.0)
Platelets: 132 10*3/uL — ABNORMAL LOW (ref 150–400)
RBC: 3.61 MIL/uL — ABNORMAL LOW (ref 4.22–5.81)
RDW: 14.5 % (ref 11.5–15.5)
WBC: 7.2 10*3/uL (ref 4.0–10.5)
nRBC: 0 % (ref 0.0–0.2)

## 2018-07-15 NOTE — Progress Notes (Signed)
STROKE TEAM PROGRESS NOTE   SUBJECTIVE (INTERVAL HISTORY) No family is at the bedside.  Pt still in C-collar cleared by CCM yesterday. Pt transferred to floor yesterday.  Currently BP stable still has hard of hearing.  PT/OT recommend CIR.  I called patient son Evan [name] to give him update and answered all his questions.   OBJECTIVE Vitals:   07/15/18 0037 07/15/18 0353 07/15/18 0750 07/15/18 1138  BP: 98/68 120/84 124/67 100/61  Pulse: 68 79 75 68  Resp:   20 (!) 22  Temp: 98.9 F (37.2 C) 97.9 F (36.6 C) 98.1 F (36.7 C) 98.4 F (36.9 C)  TempSrc: Oral Oral Oral Oral  SpO2: 100% 95% 98% 99%  Weight:      Height:        CBC:  Recent Labs  Lab 07/14/18 0536 07/15/18 0437  WBC 7.5 7.2  NEUTROABS 5.2  --   HGB 11.7* 11.2*  HCT 36.6* 34.3*  MCV 96.1 95.0  PLT 134* 132*    Basic Metabolic Panel:  Recent Labs  Lab 07/14/18 0536 07/15/18 0437  NA 135 136  K 4.3 4.0  CL 103 106  CO2 19* 20*  GLUCOSE 139* 106*  BUN 20 18  CREATININE 1.52* 1.61*  CALCIUM 9.2 8.5*    Lipid Panel:     Component Value Date/Time   CHOL 75 02/03/2018 0524   CHOL 112 01/19/2018 0929   CHOL 97 09/09/2013 0957   TRIG 60 02/03/2018 0524   TRIG 92 09/09/2013 0957   HDL 28 (L) 02/03/2018 0524   HDL 35 (L) 01/19/2018 0929   HDL 37 (L) 09/09/2013 0957   CHOLHDL 2.7 02/03/2018 0524   VLDL 12 02/03/2018 0524   LDLCALC 35 02/03/2018 0524   LDLCALC 59 01/19/2018 0929   LDLCALC 42 09/09/2013 0957   HgbA1c:  Lab Results  Component Value Date   HGBA1C 5.3 12/02/2016   Urine Drug Screen:     Component Value Date/Time   LABOPIA NONE DETECTED 07/14/2018 0536   COCAINSCRNUR NONE DETECTED 07/14/2018 0536   LABBENZ POSITIVE (A) 07/14/2018 0536   AMPHETMU NONE DETECTED 07/14/2018 0536   THCU NONE DETECTED 07/14/2018 0536   LABBARB NONE DETECTED 07/14/2018 0536    Alcohol Level     Component Value Date/Time   ETH <10 07/14/2018 0536    IMAGING  CTA HEAD AND NECK   07/14/2018 IMPRESSION  1. Negative CTA for large vessel occlusion. No acute vascular abnormality seen underlying the left thalamic hemorrhage. No aneurysm.  2. Atheromatous change about the carotid bifurcations with up to 50% narrowing on the right and 65% narrowing on the left.  3. Moderate diffuse carotid siphon atherosclerosis with moderate to advanced narrowing, left worse than right.  4. Fetal type origin of the PCAs with overall diminutive vertebrobasilar system.  5. Azygos ACA.  6. Mildly enlarged right paratracheal adenopathy, indeterminate, but could be reactive.   CT MAXILLOFACIAL  IMPRESSION  1. Soft tissue contusion with superimposed 3.4 cm hematoma at the right anterior face/neck.  2. No other acute maxillofacial injury.  No fracture.  3. Chronic left maxillary sinusitis.   CT CERVICAL SPINE  IMPRESSION  1. No acute traumatic injury within the cervical spine.  2. Moderate cervical spondylolysis at C3-4 through C6-7, with resultant moderate to severe spinal stenosis at C4-5.   Ct Head Code Stroke Wo Contrast 07/14/2018 IMPRESSION:  1. Acute intraparenchymal hemorrhage centered at the left thalamus, estimated volume 2.7 cc. Mild surrounding edema without significant regional  mass effect.  2. Underlying age-related cerebral atrophy with moderate chronic small vessel ischemic disease.  Ct Head Wo Contrast 07/15/2018 IMPRESSION: Stable hemorrhage left thalamus.  No change from yesterday.   DG ABD 2 Views 07/15/2018 IMPRESSION: Nonobstructive bowel gas pattern. Nonspecific chronic reticular opacities at the lung bases.   EKG -  SR rate 80 BPM. (See cardiology reading for complete details)   Bilateral Lower Extremity Venous Dopplers 07/14/2018 Summary: Right: There is no evidence of deep vein thrombosis in the lower extremity. However, portions of this examination were limited- see technologist comments above. Left: Findings consistent with chronic deep vein  thrombosis involving the left peroneal vein.   PHYSICAL EXAM Temp:  [97.9 F (36.6 C)-98.9 F (37.2 C)] 98.4 F (36.9 C) (04/12 1138) Pulse Rate:  [61-86] 68 (04/12 1138) Resp:  [16-28] 22 (04/12 1138) BP: (98-131)/(50-90) 100/61 (04/12 1138) SpO2:  [91 %-100 %] 99 % (04/12 1138)  General - Well nourished, well developed, in no apparent distress, in C-collar.  Ophthalmologic - fundi not visualized due to noncooperation.  Cardiovascular - Regular rate and rhythm.  Mental Status -  Level of arousal and orientation to time, place, and person were intact. Language including expression, naming, repetition, comprehension was assessed and found intact.  Cranial Nerves II - XII - II - Visual field intact OU. III, IV, VI - Extraocular movements intact. V - Facial sensation intact bilaterally. VII - Facial movement intact bilaterally. VIII - significant hard of hearing & vestibular intact bilaterally. X - Palate elevates symmetrically. XI - Chin turning & shoulder shrug intact bilaterally. XII - Tongue protrusion intact.  Motor Strength - The patient's strength was normal in LUE and LLE, however, RUE proximal 3/5, bicep and tricep 4+/5 and hand grip 3/5. RLE proximal 4/5 and distal 3/5.  Bulk was normal and fasciculations were absent.   Motor Tone - Muscle tone was assessed at the neck and appendages and was normal.  Reflexes - The patient's reflexes were symmetrical in all extremities and he had no pathological reflexes.  Sensory - Light touch, temperature/pinprick were assessed and were symmetrical.    Coordination - significant ataxia on the RUE and RLE, out of proportion to the weakness.  Tremor was absent.  Gait and Station - deferred.    ASSESSMENT/PLAN [name] is a 85 y.o. male with history of adenocarcinoma of the lung in remission, Htn, Hld, Hemochromatosis, CAD with stent, atrial fibrillation and PE on therapeutic Lovenox (failed eliquis), and hearing impaired  found on the floor by family with right hemiparesis. He did not receive IV t-PA due to [place].  ICH: Acute small ICH at the left thalamus, likely hypertensive in the setting of therapeutic lovenox  Resultant  Right hemiparesis and ataxia  CT head - Acute intraparenchymal hemorrhage centered at the left thalamus, estimated volume 2.7 cc. Mild surrounding edema without significant regional mass effect.   CTA H&N - Atheromatous change about the carotid bifurcations with up to 50% narrowing on the right and 65% narrowing on the left. Moderate athero b/l siphon, L>R. overall diminutive vertebrobasilar system.   CT repeat (07/15/2018) - Stable hemorrhage left thalamus.  No change from yesterday.  2D Echo - 04/2018 - EF 60-65%  LDL - not performed  HgbA1c - not performed  UDS - benzodiazepines (on home ativan)  VTE prophylaxis - SCDs  Lovenox 80 mg [place] Q12 hrs prior to admission, now on No antithrombotic  Ongoing aggressive stroke risk factor management  Therapy recommendations:  CIR  Disposition:  Pending  PAF Hx of PE  Currently rate controlled  On levonox therapeutic dose PTA  LE venous doppler - chronic DVT at left peroneal vein.  Currently on no antithrombotics due to [place]  No need of IVC filter as no acute DVT  S/p fall  Facial contusion   C-collar cleared by CCM  Hypertension  Stable . SBP goal < 160 mm Hg . Resume home BP meds - metoprolol, lasix . Off cleviprex . Long-term BP goal normotensive  Hyperlipidemia  Lipid lowering medication PTA: Crestor 5 mg daily  Current lipid lowering medication: resumed crestor  Continue statin at discharge  Other Stroke Risk Factors  Advanced age  Coronary artery disease  Other Active Problems  Hx of Fall with SDH in 09/2016  Mildly enlarged right paratracheal adenopathy, indeterminate, but could be reactive. Adenocarcinoma of the lung in remission.  Creatinine 1.52->1.61  Thrombocytopenia - 134  Talked with  son Evan [name] over the phone and updated pt condition with him. Answered all his questions.   Hospital day # 1   Rosalin Hawking, MD PhD Stroke Neurology 07/15/2018 5:31 PM      To contact Stroke Continuity provider, please refer to [email or web address] After hours, contact General Neurology

## 2018-07-15 NOTE — Evaluation (Addendum)
Physical Therapy Evaluation Patient Details Name: Evan [name] MRN: [id number] DOB: 1927-08-28 Today's Date: 07/15/2018   History of Present Illness  Pt is a 85 y/o male presenting after a fall and R sided weakness. CT revealed L thalamic ICH. PMH significant for but not limited to: adenocarcinoma of R lung, anxiety, arthritis, CAD, chronic back pain, depression, hepatitis, pneumonia, subdural hematoma.   Clinical Impression  Pt admitted with above. Prior to admission, pt independent with mobility and ADL's. On PT evaluation, pt presenting with decreased functional mobility secondary to right sided weakness (RUE weaker than RLE), impaired sensation, proprioception, decreased cognition, and balance deficits. Pt requiring two person moderate assistance for stand pivot transfer from bed to chair. Expect pt to make excellent progress based on PLOF, great family support (pt and pt family known to PT from previous admission), and motivation. Recommending CIR to maximize functional independence. Will follow acutely to progress mobility.    Follow Up Recommendations CIR    Equipment Recommendations  Other (comment)(TBD)    Recommendations for Other Services Rehab consult     Precautions / Restrictions Precautions Precautions: Fall Precaution Comments: SBP goal <140 Restrictions Weight Bearing Restrictions: No      Mobility  Bed Mobility Overal bed mobility: Needs Assistance Bed Mobility: Supine to Sit     Supine to sit: Mod assist;HOB elevated     General bed mobility comments: mod assist for trunk support   Transfers Overall transfer level: Needs assistance Equipment used: 2 person hand held assist Transfers: Sit to/from Stand;Stand Pivot Transfers Sit to Stand: +2 physical assistance;Mod assist Stand pivot transfers: Mod assist;+2 physical assistance       General transfer comment: ModA + 2 to stand from edge of bed and transfer towards left side to chair. Right knee blocked.  Pt with decreased coordination of RLE  Ambulation/Gait                Stairs            Wheelchair Mobility    Modified Rankin (Stroke Patients Only) Modified Rankin (Stroke Patients Only) Pre-Morbid Rankin Score: No significant disability Modified Rankin: Severe disability     Balance Overall balance assessment: Needs assistance Sitting-balance support: No upper extremity supported;Feet supported Sitting balance-[name] Scale: Poor Sitting balance - Comments: fluctuates, max to min assist with R lateral lean and able to correct with min assist/multimodal cueing  Postural control: Right lateral lean   Standing balance-[name] Scale: Poor Standing balance comment: reliant on HHA                             Pertinent Vitals/Pain Pain Assessment: No/denies pain    Home Living Family/patient expects to be discharged to:: Private residence Living Arrangements: Children(son) Available Help at Discharge: Family;Available 24 hours/day Type of Home: House Home Access: Stairs to enter   [employer] of Steps: 4 Home Layout: Able to live on main level with bedroom/bathroom;Laundry or work area in [place]: Environmental consultant - 2 wheels;Shower seat;Cane - single point Additional Comments: per chart review, pt unable to report home setup at this time     Prior Function Level of Independence: Independent         Comments: pt reports independent mobility and self care, and per chart review independent; diffcult to discern as pt HOH      Hand Dominance   Dominant Hand: Right    Extremity/Trunk Assessment   Upper Extremity Assessment Upper  Extremity Assessment: RUE deficits/detail RUE Deficits / Details: shoulder elevation 3+/5, shoulder flexion 2-/5, shoulder abduction 3-/5, elbow flexion/extension 3-/5, hand flexion/extension 3-/5; dysmetric  RUE Sensation: decreased light touch;decreased proprioception RUE Coordination: decreased fine  motor;decreased gross motor    Lower Extremity Assessment Lower Extremity Assessment: RLE deficits/detail RLE Deficits / Details: At least anti gravity (3/5) strength, weak ankle dorsiflexion and eversion RLE Sensation: decreased light touch RLE Coordination: decreased gross motor       Communication   Communication: HOH(with hearing aide but no batteries )  Cognition Arousal/Alertness: Awake/alert Behavior During Therapy: WFL for tasks assessed/performed Overall Cognitive Status: Impaired/Different from baseline Area of Impairment: Orientation;Attention;Memory;Following commands;Safety/judgement;Awareness;Problem solving                 Orientation Level: Disoriented to;Time;Situation Current Attention Level: Sustained Memory: Decreased short-term memory Following Commands: Follows one step commands consistently;Follows one step commands with increased time Safety/Judgement: Decreased awareness of safety;Decreased awareness of deficits Awareness: Intellectual Problem Solving: Slow processing;Decreased initiation;Difficulty sequencing;Requires verbal cues;Requires tactile cues General Comments: pt oriented to place ([place] and [place]) but not situation or year, able to follow 1 step commands with increased time and requires multimodal cueing       General Comments      Exercises     Assessment/Plan    PT Assessment Patient needs continued PT services  PT Problem List Decreased range of motion;Decreased strength;Decreased activity tolerance;Decreased balance;Decreased coordination;Decreased mobility;Decreased cognition;Impaired sensation       PT Treatment Interventions DME instruction;Gait training;Functional mobility training;Therapeutic activities;Therapeutic exercise;Balance training;Neuromuscular re-education;Cognitive remediation;Patient/family education    PT Goals (Current goals can be found in the Care Plan section)  Acute Rehab PT Goals Patient  Stated Goal: none stated PT Goal Formulation: With patient Time For Goal Achievement: 07/29/18 Potential to Achieve Goals: Good    Frequency Min 4X/week   Barriers to discharge        Co-evaluation               AM-PAC PT "6 Clicks" Mobility  Outcome Measure Help needed turning from your back to your side while in a flat bed without using bedrails?: A Little Help needed moving from lying on your back to sitting on the side of a flat bed without using bedrails?: A Lot Help needed moving to and from a bed to a chair (including a wheelchair)?: A Lot Help needed standing up from a chair using your arms (e.g., wheelchair or bedside chair)?: A Lot Help needed to walk in hospital room?: Total Help needed climbing 3-5 steps with a railing? : Total 6 Click Score: 11    End of Session Equipment Utilized During Treatment: Gait belt;Oxygen Activity Tolerance: Patient tolerated treatment well Patient left: in chair;with call bell/phone within reach;with chair alarm set Nurse Communication: Mobility status PT Visit Diagnosis: Unsteadiness on feet (R26.81);Other symptoms and signs involving the nervous system (R29.898);Difficulty in walking, not elsewhere classified (R26.2)    Time: 2836-6294 PT Time Calculation (min) (ACUTE ONLY): 17 min   Charges:   PT Evaluation $PT Eval Moderate Complexity: 1 Mod          Ellamae Sia, [place], DPT Acute Rehabilitation Services Pager [phone] Office [phone]   Willy Eddy 07/15/2018, 2:05 PM

## 2018-07-15 NOTE — Evaluation (Signed)
Occupational Therapy Evaluation Patient Details Name: Evan [name] MRN: [id number] DOB: Mar 10, 1928 Today's Date: 07/15/2018    History of Present Illness Pt is a 85 y/o male presenting after a fall and R sided weakness. CT revealed L thalamic ICH. PMH significant for but not limited to: adenocarcinoma of R lung, anxiety, arthritis, CAD, chronic back pain, dperession, hepatitis, pneumonia, subdural hematoma.    Clinical Impression   PTA patient reports independent with mobility and ADLs.  Patient admitted for above and limited by problem list below, including R sided hemiparesis with decreased functional use of dominant UE, impaired balance, impaired cognition and decreased activity tolerance. Patient currently requires mod assist for bed mobility, min assist for eating, mod assist for grooming, mod assist for UB ADLS and total assist for LB ADLs.  Evaluation limited to EOB activity, requires min to max assist to maintain sitting balance EOB with R lateral lean.  Oriented to person and place only, able to follow 1 step commands with increased time given multimodal cueing.  Home setup received from prior admission, as patient is very hard of hearing and his hearing aide is not working.  Patient is highly motivated and believe he will best benefit from continued OT services while admitted and intensive CIR rehab at discharge in order to maximize independence and safety with ADLs/mobility.  Will follow.     Follow Up Recommendations  CIR;Supervision/Assistance - 24 hour    Equipment Recommendations  Other (comment)(TBD at next venue of care)    Recommendations for Other Services PT consult;Rehab consult     Precautions / Restrictions Precautions Precautions: Fall Precaution Comments: SBP goal <140 Restrictions Weight Bearing Restrictions: No      Mobility Bed Mobility Overal bed mobility: Needs Assistance Bed Mobility: Supine to Sit;Sit to Supine     Supine to sit: Mod assist;HOB  elevated Sit to supine: Mod assist   General bed mobility comments: mod assist for trunk support and scooting towards EOB, mod assist trunk support to supine and scooting towards [facility]  Transfers                 General transfer comment: deferred due to safety (only +1 assist)    Balance Overall balance assessment: Needs assistance Sitting-balance support: No upper extremity supported;Feet supported Sitting balance-[name] Scale: Poor Sitting balance - Comments: fluctuates, max to min assist with R lateral lean and able to correct with min assist/multimodal cueing  Postural control: Right lateral lean                                 ADL either performed or assessed with clinical judgement   ADL Overall ADL's : Needs assistance/impaired Eating/Feeding: Bed level;Minimal assistance Eating/Feeding Details (indicate cue type and reason): min assist to setup using L UE, cueing to use L UE to increase ease and independence at this time  Grooming: Moderate assistance;Sitting Grooming Details (indicate cue type and reason): perseverates on washing face, able to wash hands with min assist given multimodal cueing  Upper Body Bathing: Moderate assistance;Sitting   Lower Body Bathing: Maximal assistance;Bed level   Upper Body Dressing : Moderate assistance;Sitting   Lower Body Dressing: Total assistance;Sitting/lateral leans     Toilet Transfer Details (indicate cue type and reason): deferred due to safety         Functional mobility during ADLs: Moderate assistance(limited to bed mobility only) General ADL Comments: pt limited by cognition, R hemiparesis, impaired  balance and decreased act tolerance     Vision Baseline Vision/History: Wears glasses Wears Glasses: At all times Patient Visual Report: No change from baseline Additional Comments: further asssessment needed     Perception     Praxis      Pertinent Vitals/Pain Pain Assessment: No/denies pain      Hand Dominance Right   Extremity/Trunk Assessment Upper Extremity Assessment Upper Extremity Assessment: RUE deficits/detail RUE Deficits / Details: shoulder elevation 3+/5, shoulder flexion 2-/5, shoulder abduction 3-/5, elbow flexion/extension 3-/5, hand flexion/extension 3-/5; dysmetric  RUE Sensation: decreased light touch;decreased proprioception RUE Coordination: decreased fine motor;decreased gross motor   Lower Extremity Assessment Lower Extremity Assessment: Defer to PT evaluation       Communication Communication Communication: HOH(with hearing aide but no batteries )   Cognition Arousal/Alertness: Awake/alert Behavior During Therapy: WFL for tasks assessed/performed Overall Cognitive Status: Impaired/Different from baseline Area of Impairment: Orientation;Attention;Memory;Following commands;Safety/judgement;Awareness;Problem solving                 Orientation Level: Disoriented to;Time;Situation Current Attention Level: Sustained Memory: Decreased short-term memory Following Commands: Follows one step commands consistently;Follows one step commands with increased time Safety/Judgement: Decreased awareness of safety;Decreased awareness of deficits Awareness: Intellectual Problem Solving: Slow processing;Decreased initiation;Difficulty sequencing;Requires verbal cues;Requires tactile cues General Comments: pt oriented to place ([place] and ) but not situation or year, able to follow 1 step commands with increased time and requires multimodal cueing    General Comments  VSS, on 2L supplemental oxgyen via [place] during session    Exercises     Shoulder Instructions      Home Living Family/patient expects to be discharged to:: Private residence Living Arrangements: Children(son) Available Help at Discharge: Family;Available 24 hours/day Type of Home: House Home Access: Stairs to enter [employer] of Steps: 4   Home Layout: Able to  live on main level with bedroom/bathroom;Laundry or work area in basement     [employer] Shower/Tub: Hospital doctor Toilet: Handicapped [place]: Environmental consultant - 2 wheels;Shower seat;Cane - single point   Additional Comments: per chart review, pt unable to report home setup at this time       Prior Functioning/Environment Level of Independence: Independent        Comments: pt reports independent mobility and self care, and per chart review independent; diffcult to discern as pt HOH         OT Problem List: Decreased strength;Decreased range of motion;Impaired balance (sitting and/or standing);Decreased activity tolerance;Impaired vision/perception;Decreased coordination;Decreased cognition;Decreased safety awareness;Decreased knowledge of use of DME or AE;Decreased knowledge of precautions;Impaired sensation;Impaired tone;Impaired UE functional use      OT Treatment/Interventions: Self-care/ADL training;Therapeutic exercise;Neuromuscular education;DME and/or AE instruction;Therapeutic activities;Cognitive remediation/compensation;Visual/perceptual remediation/compensation;Balance training;Patient/family education    OT Goals(Current goals can be found in the care plan section) Acute Rehab OT Goals Patient Stated Goal: none stated Time For Goal Achievement: 07/29/18 Potential to Achieve Goals: Good  OT Frequency: Min 3X/week   Barriers to D/C:            Co-evaluation              AM-PAC OT "6 Clicks" Daily Activity     Outcome Measure Help from another person eating meals?: A Little Help from another person taking care of personal grooming?: A Lot Help from another person toileting, which includes using toliet, bedpan, or urinal?: Total Help from another person bathing (including washing, rinsing, drying)?: A Lot Help from another person to put  on and taking off regular upper body clothing?: A Lot Help from another person to put on and taking  off regular lower body clothing?: Total 6 Click Score: 11   End of Session Equipment Utilized During Treatment: Oxygen Nurse Communication: Mobility status  Activity Tolerance: Patient tolerated treatment well Patient left: in bed;with bed alarm set;with call bell/phone within reach;with nursing/sitter in room;with SCD's reapplied  OT Visit Diagnosis: Other abnormalities of gait and mobility (R26.89);Other symptoms and signs involving cognitive function;Hemiplegia and hemiparesis Hemiplegia - Right/Left: Right Hemiplegia - dominant/non-dominant: Dominant Hemiplegia - caused by: Nontraumatic intracerebral hemorrhage                Time: 0738-0803 OT Time Calculation (min): 25 min Charges:  OT General Charges $OT Visit: 1 Visit OT Evaluation $OT Eval Moderate Complexity: 1 Mod OT Treatments $Self Care/Home Management : 8-22 mins  Delight Stare, OT Acute Rehabilitation Services Pager [phone] Office [phone]   Delight Stare 07/15/2018, 8:56 AM

## 2018-07-15 NOTE — Progress Notes (Signed)
PCCM Interval Note  I reviewed his meds with Pharmacist 4/11 > he does not appear to be on chronic prednisone, was ordered for his rash as an outpatient.   Prelim read on his doppler US of the LE showed no DVT on R, chronic L peroneal clot. No evidence to support IVC filter placement while he is off anticoagulation. Given his hx malignancy, would be optimal to restart anticoagulation when deemed safe to do so by neurology, but this may not be possible - will have to weigh the risks and benefits.   He has moved out of ICU, recommend IM consult if further questions come up about his medical issues.    Baltazar Apo, MD, PhD 07/15/2018, 12:05 PM [facility] [phone] or if no answer [phone]

## 2018-07-15 NOTE — Progress Notes (Signed)
Rehab Admissions Coordinator Note:  Per OT recommendation, this patient was screened by Jhonnie Garner for appropriateness for an Inpatient Acute Rehab Consult.  At this time, we are recommending an Inpatient Rehab consult. AC will contact MD to request an IP Rehab Consult Order.   Jhonnie Garner 07/15/2018, 10:53 AM  I can be reached at [phone].

## 2018-07-16 ENCOUNTER — Other Ambulatory Visit: Payer: Self-pay

## 2018-07-16 ENCOUNTER — Inpatient Hospital Stay (HOSPITAL_COMMUNITY): Payer: Medicare HMO

## 2018-07-16 ENCOUNTER — Inpatient Hospital Stay (HOSPITAL_COMMUNITY)
Admission: RE | Admit: 2018-07-16 | Discharge: 2018-08-15 | DRG: 057 | Disposition: A | Discharge status: Home Health | Payer: Medicare HMO | Source: Intra-hospital | Attending: Physical Medicine & Rehabilitation | Admitting: Physical Medicine & Rehabilitation

## 2018-07-16 ENCOUNTER — Encounter (HOSPITAL_COMMUNITY): Payer: Self-pay

## 2018-07-16 DIAGNOSIS — I252 Old myocardial infarction: Secondary | ICD-10-CM | POA: Diagnosis not present

## 2018-07-16 DIAGNOSIS — Z91040 Latex allergy status: Secondary | ICD-10-CM

## 2018-07-16 DIAGNOSIS — Z9981 Dependence on supplemental oxygen: Secondary | ICD-10-CM

## 2018-07-16 DIAGNOSIS — E873 Alkalosis: Secondary | ICD-10-CM | POA: Diagnosis not present

## 2018-07-16 DIAGNOSIS — Z96653 Presence of artificial knee joint, bilateral: Secondary | ICD-10-CM | POA: Diagnosis present

## 2018-07-16 DIAGNOSIS — Z961 Presence of intraocular lens: Secondary | ICD-10-CM | POA: Diagnosis present

## 2018-07-16 DIAGNOSIS — E871 Hypo-osmolality and hyponatremia: Secondary | ICD-10-CM | POA: Diagnosis not present

## 2018-07-16 DIAGNOSIS — I69391 Dysphagia following cerebral infarction: Secondary | ICD-10-CM

## 2018-07-16 DIAGNOSIS — I2510 Atherosclerotic heart disease of native coronary artery without angina pectoris: Secondary | ICD-10-CM | POA: Diagnosis not present

## 2018-07-16 DIAGNOSIS — R0603 Acute respiratory distress: Secondary | ICD-10-CM | POA: Diagnosis not present

## 2018-07-16 DIAGNOSIS — I69251 Hemiplegia and hemiparesis following other nontraumatic intracranial hemorrhage affecting right dominant side: Principal | ICD-10-CM

## 2018-07-16 DIAGNOSIS — R0989 Other specified symptoms and signs involving the circulatory and respiratory systems: Secondary | ICD-10-CM

## 2018-07-16 DIAGNOSIS — D72823 Leukemoid reaction: Secondary | ICD-10-CM | POA: Diagnosis not present

## 2018-07-16 DIAGNOSIS — I129 Hypertensive chronic kidney disease with stage 1 through stage 4 chronic kidney disease, or unspecified chronic kidney disease: Secondary | ICD-10-CM | POA: Diagnosis not present

## 2018-07-16 DIAGNOSIS — D689 Coagulation defect, unspecified: Secondary | ICD-10-CM | POA: Diagnosis not present

## 2018-07-16 DIAGNOSIS — X58XXXD Exposure to other specified factors, subsequent encounter: Secondary | ICD-10-CM | POA: Diagnosis present

## 2018-07-16 DIAGNOSIS — J449 Chronic obstructive pulmonary disease, unspecified: Secondary | ICD-10-CM | POA: Diagnosis present

## 2018-07-16 DIAGNOSIS — N183 Chronic kidney disease, stage 3 unspecified: Secondary | ICD-10-CM

## 2018-07-16 DIAGNOSIS — Z955 Presence of coronary angioplasty implant and graft: Secondary | ICD-10-CM

## 2018-07-16 DIAGNOSIS — R5383 Other fatigue: Secondary | ICD-10-CM

## 2018-07-16 DIAGNOSIS — R0902 Hypoxemia: Secondary | ICD-10-CM | POA: Diagnosis not present

## 2018-07-16 DIAGNOSIS — J811 Chronic pulmonary edema: Secondary | ICD-10-CM | POA: Diagnosis not present

## 2018-07-16 DIAGNOSIS — I619 Nontraumatic intracerebral hemorrhage, unspecified: Secondary | ICD-10-CM | POA: Diagnosis not present

## 2018-07-16 DIAGNOSIS — F329 Major depressive disorder, single episode, unspecified: Secondary | ICD-10-CM | POA: Diagnosis not present

## 2018-07-16 DIAGNOSIS — M069 Rheumatoid arthritis, unspecified: Secondary | ICD-10-CM | POA: Diagnosis present

## 2018-07-16 DIAGNOSIS — N400 Enlarged prostate without lower urinary tract symptoms: Secondary | ICD-10-CM | POA: Diagnosis present

## 2018-07-16 DIAGNOSIS — E785 Hyperlipidemia, unspecified: Secondary | ICD-10-CM | POA: Diagnosis not present

## 2018-07-16 DIAGNOSIS — Z8261 Family history of arthritis: Secondary | ICD-10-CM

## 2018-07-16 DIAGNOSIS — D72829 Elevated white blood cell count, unspecified: Secondary | ICD-10-CM

## 2018-07-16 DIAGNOSIS — N1831 Chronic kidney disease, stage 3a: Secondary | ICD-10-CM

## 2018-07-16 DIAGNOSIS — J9811 Atelectasis: Secondary | ICD-10-CM | POA: Diagnosis not present

## 2018-07-16 DIAGNOSIS — Z79899 Other long term (current) drug therapy: Secondary | ICD-10-CM

## 2018-07-16 DIAGNOSIS — I69219 Unspecified symptoms and signs involving cognitive functions following other nontraumatic intracranial hemorrhage: Secondary | ICD-10-CM

## 2018-07-16 DIAGNOSIS — Z947 Corneal transplant status: Secondary | ICD-10-CM

## 2018-07-16 DIAGNOSIS — Z888 Allergy status to other drugs, medicaments and biological substances status: Secondary | ICD-10-CM | POA: Diagnosis not present

## 2018-07-16 DIAGNOSIS — I2699 Other pulmonary embolism without acute cor pulmonale: Secondary | ICD-10-CM | POA: Diagnosis not present

## 2018-07-16 DIAGNOSIS — J9601 Acute respiratory failure with hypoxia: Secondary | ICD-10-CM | POA: Diagnosis not present

## 2018-07-16 DIAGNOSIS — D631 Anemia in chronic kidney disease: Secondary | ICD-10-CM | POA: Diagnosis present

## 2018-07-16 DIAGNOSIS — X58XXXA Exposure to other specified factors, initial encounter: Secondary | ICD-10-CM | POA: Diagnosis present

## 2018-07-16 DIAGNOSIS — N1832 Chronic kidney disease, stage 3b: Secondary | ICD-10-CM

## 2018-07-16 DIAGNOSIS — F411 Generalized anxiety disorder: Secondary | ICD-10-CM

## 2018-07-16 DIAGNOSIS — Z86711 Personal history of pulmonary embolism: Secondary | ICD-10-CM | POA: Diagnosis not present

## 2018-07-16 DIAGNOSIS — I618 Other nontraumatic intracerebral hemorrhage: Secondary | ICD-10-CM | POA: Diagnosis not present

## 2018-07-16 DIAGNOSIS — R5381 Other malaise: Secondary | ICD-10-CM | POA: Diagnosis present

## 2018-07-16 DIAGNOSIS — R0602 Shortness of breath: Secondary | ICD-10-CM

## 2018-07-16 DIAGNOSIS — D696 Thrombocytopenia, unspecified: Secondary | ICD-10-CM | POA: Diagnosis not present

## 2018-07-16 DIAGNOSIS — S51801D Unspecified open wound of right forearm, subsequent encounter: Secondary | ICD-10-CM

## 2018-07-16 DIAGNOSIS — L03113 Cellulitis of right upper limb: Secondary | ICD-10-CM

## 2018-07-16 DIAGNOSIS — G936 Cerebral edema: Secondary | ICD-10-CM | POA: Diagnosis not present

## 2018-07-16 DIAGNOSIS — D62 Acute posthemorrhagic anemia: Secondary | ICD-10-CM | POA: Diagnosis present

## 2018-07-16 DIAGNOSIS — K219 Gastro-esophageal reflux disease without esophagitis: Secondary | ICD-10-CM | POA: Diagnosis present

## 2018-07-16 DIAGNOSIS — I480 Paroxysmal atrial fibrillation: Secondary | ICD-10-CM | POA: Diagnosis present

## 2018-07-16 DIAGNOSIS — I4891 Unspecified atrial fibrillation: Secondary | ICD-10-CM | POA: Diagnosis not present

## 2018-07-16 DIAGNOSIS — R739 Hyperglycemia, unspecified: Secondary | ICD-10-CM | POA: Diagnosis not present

## 2018-07-16 DIAGNOSIS — W19XXXA Unspecified fall, initial encounter: Secondary | ICD-10-CM

## 2018-07-16 DIAGNOSIS — Z951 Presence of aortocoronary bypass graft: Secondary | ICD-10-CM | POA: Diagnosis not present

## 2018-07-16 DIAGNOSIS — J90 Pleural effusion, not elsewhere classified: Secondary | ICD-10-CM | POA: Diagnosis not present

## 2018-07-16 DIAGNOSIS — Z9842 Cataract extraction status, left eye: Secondary | ICD-10-CM

## 2018-07-16 DIAGNOSIS — Z9841 Cataract extraction status, right eye: Secondary | ICD-10-CM

## 2018-07-16 DIAGNOSIS — I610 Nontraumatic intracerebral hemorrhage in hemisphere, subcortical: Secondary | ICD-10-CM | POA: Diagnosis not present

## 2018-07-16 DIAGNOSIS — F32A Depression, unspecified: Secondary | ICD-10-CM

## 2018-07-16 DIAGNOSIS — H9190 Unspecified hearing loss, unspecified ear: Secondary | ICD-10-CM | POA: Diagnosis present

## 2018-07-16 DIAGNOSIS — Z881 Allergy status to other antibiotic agents status: Secondary | ICD-10-CM

## 2018-07-16 DIAGNOSIS — T380X5A Adverse effect of glucocorticoids and synthetic analogues, initial encounter: Secondary | ICD-10-CM | POA: Diagnosis not present

## 2018-07-16 DIAGNOSIS — Z7901 Long term (current) use of anticoagulants: Secondary | ICD-10-CM | POA: Diagnosis not present

## 2018-07-16 DIAGNOSIS — G9389 Other specified disorders of brain: Secondary | ICD-10-CM | POA: Diagnosis not present

## 2018-07-16 DIAGNOSIS — R9431 Abnormal electrocardiogram [ECG] [EKG]: Secondary | ICD-10-CM | POA: Diagnosis not present

## 2018-07-16 DIAGNOSIS — Z85118 Personal history of other malignant neoplasm of bronchus and lung: Secondary | ICD-10-CM

## 2018-07-16 DIAGNOSIS — R7309 Other abnormal glucose: Secondary | ICD-10-CM

## 2018-07-16 DIAGNOSIS — R54 Age-related physical debility: Secondary | ICD-10-CM | POA: Diagnosis not present

## 2018-07-16 DIAGNOSIS — Z8701 Personal history of pneumonia (recurrent): Secondary | ICD-10-CM

## 2018-07-16 DIAGNOSIS — D72828 Other elevated white blood cell count: Secondary | ICD-10-CM | POA: Diagnosis not present

## 2018-07-16 DIAGNOSIS — F339 Major depressive disorder, recurrent, unspecified: Secondary | ICD-10-CM | POA: Diagnosis not present

## 2018-07-16 DIAGNOSIS — D649 Anemia, unspecified: Secondary | ICD-10-CM | POA: Diagnosis not present

## 2018-07-16 DIAGNOSIS — Z86718 Personal history of other venous thrombosis and embolism: Secondary | ICD-10-CM

## 2018-07-16 DIAGNOSIS — R591 Generalized enlarged lymph nodes: Secondary | ICD-10-CM

## 2018-07-16 DIAGNOSIS — R599 Enlarged lymph nodes, unspecified: Secondary | ICD-10-CM

## 2018-07-16 HISTORY — DX: Unspecified fall, initial encounter: W19.XXXA

## 2018-07-16 LAB — BASIC METABOLIC PANEL
Anion gap: 13 (ref 5–15)
BUN: 18 mg/dL (ref 8–23)
CO2: 21 mmol/L — ABNORMAL LOW (ref 22–32)
Calcium: 9 mg/dL (ref 8.9–10.3)
Chloride: 102 mmol/L (ref 98–111)
Creatinine, Ser: 1.44 mg/dL — ABNORMAL HIGH (ref 0.61–1.24)
GFR calc Af Amer: 50 mL/min — ABNORMAL LOW (ref >60)
GFR calc non Af Amer: 43 mL/min — ABNORMAL LOW (ref >60)
Glucose, Bld: 127 mg/dL — ABNORMAL HIGH (ref 70–99)
Potassium: 4.2 mmol/L (ref 3.5–5.1)
Sodium: 136 mmol/L (ref 135–145)

## 2018-07-16 LAB — CBC
HCT: 35.3 % — ABNORMAL LOW (ref 39.0–52.0)
Hemoglobin: 11.8 g/dL — ABNORMAL LOW (ref 13.0–17.0)
MCH: 31.6 pg (ref 26.0–34.0)
MCHC: 33.4 g/dL (ref 30.0–36.0)
MCV: 94.4 fL (ref 80.0–100.0)
Platelets: 129 10*3/uL — ABNORMAL LOW (ref 150–400)
RBC: 3.74 MIL/uL — ABNORMAL LOW (ref 4.22–5.81)
RDW: 14.2 % (ref 11.5–15.5)
WBC: 8.6 10*3/uL (ref 4.0–10.5)
nRBC: 0 % (ref 0.0–0.2)

## 2018-07-16 MED ORDER — TRAZODONE HCL 50 MG PO TABS
25.0000 mg | ORAL_TABLET | Freq: Every evening | ORAL | Status: DC | PRN
  Administered 2018-07-16 – 2018-07-18 (×3): 50 mg via ORAL
  Filled 2018-07-16 (×3): qty 1

## 2018-07-16 MED ORDER — FUROSEMIDE 20 MG PO TABS: 20.0000 mg | ORAL_TABLET | Freq: Every day | ORAL | Status: DC

## 2018-07-16 MED ORDER — PANTOPRAZOLE SODIUM 40 MG PO TBEC: 40.0000 mg | DELAYED_RELEASE_TABLET | ORAL | Status: DC

## 2018-07-16 MED ORDER — ROSUVASTATIN CALCIUM 5 MG PO TABS
5.0000 mg | ORAL_TABLET | Freq: Every day | ORAL | Status: DC
  Administered 2018-07-16 – 2018-08-14 (×30): 5 mg via ORAL
  Filled 2018-07-16 (×30): qty 1

## 2018-07-16 MED ORDER — SENNOSIDES-DOCUSATE SODIUM 8.6-50 MG PO TABS: 1.0000 | ORAL_TABLET | Freq: Two times a day (BID) | ORAL | Status: DC

## 2018-07-16 MED ORDER — IPRATROPIUM-ALBUTEROL 0.5-2.5 (3) MG/3ML IN SOLN
3.0000 mL | Freq: Four times a day (QID) | RESPIRATORY_TRACT | Status: DC | PRN
  Administered 2018-07-16 – 2018-07-19 (×3): 3 mL via RESPIRATORY_TRACT
  Filled 2018-07-16 (×3): qty 3

## 2018-07-16 MED ORDER — FLUOXETINE HCL 20 MG PO CAPS
20.0000 mg | ORAL_CAPSULE | Freq: Every day | ORAL | Status: DC
  Administered 2018-07-17 – 2018-08-06 (×21): 20 mg via ORAL
  Filled 2018-07-16 (×21): qty 1

## 2018-07-16 MED ORDER — GUAIFENESIN-DM 100-10 MG/5ML PO SYRP
5.0000 mL | ORAL_SOLUTION | Freq: Four times a day (QID) | ORAL | Status: DC | PRN
  Administered 2018-07-20 – 2018-07-21 (×2): 10 mL via ORAL
  Filled 2018-07-16 (×2): qty 10

## 2018-07-16 MED ORDER — ACETAMINOPHEN 325 MG PO TABS: 650.0000 mg | ORAL_TABLET | ORAL | Status: DC | PRN

## 2018-07-16 MED ORDER — METOPROLOL TARTRATE 25 MG PO TABS: 12.5000 mg | ORAL_TABLET | Freq: Two times a day (BID) | ORAL | Status: DC

## 2018-07-16 MED ORDER — TAMSULOSIN HCL 0.4 MG PO CAPS: 0.4000 mg | ORAL_CAPSULE | Freq: Every day | ORAL | Status: DC

## 2018-07-16 MED ORDER — LORAZEPAM 0.5 MG PO TABS: 0.5000 mg | ORAL_TABLET | Freq: Three times a day (TID) | ORAL | 0 refills | Status: DC

## 2018-07-16 MED ORDER — TAMSULOSIN HCL 0.4 MG PO CAPS
0.4000 mg | ORAL_CAPSULE | Freq: Every day | ORAL | Status: DC
  Administered 2018-07-17 – 2018-08-15 (×30): 0.4 mg via ORAL
  Filled 2018-07-16 (×30): qty 1

## 2018-07-16 MED ORDER — SENNOSIDES-DOCUSATE SODIUM 8.6-50 MG PO TABS
1.0000 | ORAL_TABLET | Freq: Every day | ORAL | Status: DC
  Administered 2018-07-17 – 2018-08-14 (×28): 1 via ORAL
  Filled 2018-07-16 (×27): qty 1

## 2018-07-16 MED ORDER — FUROSEMIDE 20 MG PO TABS
20.0000 mg | ORAL_TABLET | Freq: Every day | ORAL | Status: DC
  Administered 2018-07-17 – 2018-07-21 (×5): 20 mg via ORAL
  Filled 2018-07-16 (×5): qty 1

## 2018-07-16 MED ORDER — ROSUVASTATIN CALCIUM 5 MG PO TABS: 5.0000 mg | ORAL_TABLET | Freq: Every day | ORAL | Status: DC

## 2018-07-16 MED ORDER — PANTOPRAZOLE SODIUM 40 MG PO TBEC
40.0000 mg | DELAYED_RELEASE_TABLET | ORAL | Status: DC
  Administered 2018-07-18 – 2018-08-15 (×15): 40 mg via ORAL
  Filled 2018-07-16 (×29): qty 1

## 2018-07-16 MED ORDER — PROCHLORPERAZINE 25 MG RE SUPP: 12.5000 mg | Freq: Four times a day (QID) | RECTAL | Status: DC | PRN

## 2018-07-16 MED ORDER — PROCHLORPERAZINE MALEATE 5 MG PO TABS: 5.0000 mg | ORAL_TABLET | Freq: Four times a day (QID) | ORAL | Status: DC | PRN

## 2018-07-16 MED ORDER — LORAZEPAM 0.5 MG PO TABS
0.5000 mg | ORAL_TABLET | Freq: Three times a day (TID) | ORAL | Status: DC
  Administered 2018-07-16 – 2018-07-23 (×21): 0.5 mg via ORAL
  Filled 2018-07-16 (×22): qty 1

## 2018-07-16 MED ORDER — DIPHENHYDRAMINE HCL 12.5 MG/5ML PO ELIX: 12.5000 mg | ORAL_SOLUTION | Freq: Four times a day (QID) | ORAL | Status: DC | PRN

## 2018-07-16 MED ORDER — ALUM & MAG HYDROXIDE-SIMETH 200-200-20 MG/5ML PO SUSP: 30.0000 mL | ORAL | Status: DC | PRN

## 2018-07-16 MED ORDER — CLONIDINE HCL 0.1 MG PO TABS: 0.1000 mg | ORAL_TABLET | Freq: Three times a day (TID) | ORAL | Status: DC | PRN

## 2018-07-16 MED ORDER — LEVALBUTEROL HCL 0.63 MG/3ML IN NEBU: 0.6300 mg | INHALATION_SOLUTION | Freq: Four times a day (QID) | RESPIRATORY_TRACT | Status: DC | PRN

## 2018-07-16 MED ORDER — CEPHALEXIN 250 MG PO CAPS
500.0000 mg | ORAL_CAPSULE | Freq: Two times a day (BID) | ORAL | Status: AC
  Administered 2018-07-17 – 2018-07-18 (×4): 500 mg via ORAL
  Filled 2018-07-16 (×4): qty 2

## 2018-07-16 MED ORDER — CEPHALEXIN 500 MG PO CAPS: 500.0000 mg | ORAL_CAPSULE | Freq: Three times a day (TID) | ORAL | Status: DC

## 2018-07-16 MED ORDER — MAGNESIUM OXIDE 400 (241.3 MG) MG PO TABS
400.0000 mg | ORAL_TABLET | Freq: Two times a day (BID) | ORAL | Status: DC
  Administered 2018-07-16 – 2018-08-15 (×60): 400 mg via ORAL
  Filled 2018-07-16 (×60): qty 1

## 2018-07-16 MED ORDER — ACETAMINOPHEN 325 MG PO TABS
325.0000 mg | ORAL_TABLET | ORAL | Status: DC | PRN
  Administered 2018-07-16 – 2018-07-29 (×5): 650 mg via ORAL
  Administered 2018-08-06: 325 mg via ORAL
  Administered 2018-08-13: 650 mg via ORAL
  Filled 2018-07-16: qty 2
  Filled 2018-07-16: qty 1
  Filled 2018-07-16 (×5): qty 2

## 2018-07-16 MED ORDER — BISACODYL 10 MG RE SUPP: 10.0000 mg | Freq: Every day | RECTAL | Status: DC | PRN

## 2018-07-16 MED ORDER — LIFITEGRAST 5 % OP SOLN: 1.0000 [drp] | Freq: Every day | OPHTHALMIC | Status: DC

## 2018-07-16 MED ORDER — PROCHLORPERAZINE EDISYLATE 10 MG/2ML IJ SOLN: 5.0000 mg | Freq: Four times a day (QID) | INTRAMUSCULAR | Status: DC | PRN

## 2018-07-16 MED ORDER — FLEET ENEMA 7-19 GM/118ML RE ENEM: 1.0000 | ENEMA | Freq: Once | RECTAL | Status: DC | PRN

## 2018-07-16 MED ORDER — POLYETHYLENE GLYCOL 3350 17 G PO PACK: 17.0000 g | PACK | Freq: Every day | ORAL | Status: DC | PRN

## 2018-07-16 MED ORDER — METOPROLOL TARTRATE 12.5 MG HALF TABLET
12.5000 mg | ORAL_TABLET | Freq: Two times a day (BID) | ORAL | Status: DC
  Administered 2018-07-16 – 2018-08-15 (×59): 12.5 mg via ORAL
  Filled 2018-07-16 (×59): qty 1

## 2018-07-16 MED ORDER — CYCLOSPORINE 0.05 % OP EMUL
1.0000 [drp] | Freq: Two times a day (BID) | OPHTHALMIC | Status: DC
  Administered 2018-07-16 – 2018-08-15 (×60): 1 [drp] via OPHTHALMIC
  Filled 2018-07-16 (×61): qty 30

## 2018-07-16 NOTE — Progress Notes (Signed)
Inpatient Rehabilitation Admissions Coordinator  Inpatient Rehab Consult received. I met with patient at the bedside for rehabilitation assessment. He is very HOH and working with PT and OT. I contacted pt's daughter, [name] Found, by phone. We discussed goals and expectations of an inpatient rehab admission.  She prefers an inpt rehab admit rather than SNF. I will begin insurance authorization with [facility] for a possible admission today pending insurance approval and bed availability.  Danne Baxter, RN, MSN Rehab Admissions Coordinator [phone] 07/16/2018 10:24 AM

## 2018-07-16 NOTE — Progress Notes (Signed)
Occupational Therapy Treatment Patient Details Name: Evan [name] MRN: [id number] DOB: 09/27/1927 Today's Date: 07/16/2018    History of present illness Pt is a 85 y/o male presenting after a fall and R sided weakness. CT revealed L thalamic ICH. PMH significant for but not limited to: adenocarcinoma of R lung, anxiety, arthritis, CAD, chronic back pain, dperession, hepatitis, pneumonia, subdural hematoma.    OT comments  Pt presents supine in bed agreeable to therapy session. Pt performing grooming ADL seated EOB. Pt able to maintain static sitting balance intermittently with close minguard assist, when dynamically challenged pt requiring min-modA to maintain balance due to R lateral lean - given cues pt is able to correct. Use of hand over hand assist provided to incorporate RUE into self-care tasks with pt also using LUE to incorporate RUE into task as well. Pt requiring modA+2 for safe completion of functional transfers during session. Noted to have increased SOB and fatigue with activity, pt on 2L O2 during session with SpO2 maintaining >94%. Feel pt remains an excellent candidate for CIR level therapy services at time of discharge. Will continue to follow acutely to progress pt towards established OT goals.   Follow Up Recommendations  CIR;Supervision/Assistance - 24 hour    Equipment Recommendations  Other (comment)(TBD in next venue)          Precautions / Restrictions Precautions Precautions: Fall Precaution Comments: bruising along R side of neck Restrictions Weight Bearing Restrictions: No       Mobility Bed Mobility Overal bed mobility: Needs Assistance Bed Mobility: Supine to Sit     Supine to sit: Min assist     General bed mobility comments: max directional v/c's, R LE requiring increased time to move towards R side of bed, pt used L UE to reach for bed rail and pull up, HOB slightly elevated  Transfers Overall transfer level: Needs assistance Equipment used:  Rolling walker (2 wheeled);2 person hand held assist Transfers: Sit to/from [facility] Sit to Stand: Min assist;Mod assist;+2 physical assistance Stand pivot transfers: Mod assist;+2 physical assistance       General transfer comment: completed several sit/stand attempts, with and without RW. Pt initially can power up and stand with R LE locked in extension however pt fatigues and quickly buckles/demos R knee instability, pt able to advance R LE however ataxic and difficulty placing and controlling, able to lift L LE x2 prior to R knee buckling requiring modA to block R knee. Pt requierd modAX2 for std pvt to chair as patient unable to sequence or coordinate setpping pattern. Pt attempted to stand up to RW however pt unable to grip wih R hand and with signifficant  Rlateral lean without less physical assist    Balance Overall balance assessment: Needs assistance Sitting-balance support: No upper extremity supported;Feet supported Sitting balance-[name] Scale: Fair Sitting balance - Comments: pt able to maintain midline posture statically but with strong lean to the R with dynamic activity Postural control: Right lateral lean   Standing balance-[name] Scale: Poor Standing balance comment: dependent on physical assist                           ADL either performed or assessed with clinical judgement   ADL Overall ADL's : Needs assistance/impaired     Grooming: Oral care;Wash/dry face;Minimal assistance;Moderate assistance;Sitting Grooming Details (indicate cue type and reason): hand over hand assist to incorporate RUE into task - pt noted to have food pocketed  in mouth during completion of oral care, also requires questioning cues to sequence through task as pt attempting to brush teeth without applying toothpaste; requires increased assist with dynamic sitting balance EOB                             Functional mobility during ADLs: Moderate  assistance;+2 for physical assistance;+2 for safety/equipment(stand pivot transfers)       Vision       Perception     Praxis      Cognition Arousal/Alertness: Awake/alert Behavior During Therapy: WFL for tasks assessed/performed Overall Cognitive Status: Impaired/Different from baseline Area of Impairment: Safety/judgement                   Current Attention Level: Sustained   Following Commands: Follows one step commands with increased time;Follows multi-step commands inconsistently Safety/Judgement: Decreased awareness of deficits Awareness: Emergent Problem Solving: Difficulty sequencing;Requires verbal cues;Requires tactile cues General Comments: pt extremely HOH which can contribute to difficulty following commands, pt improved from yesterday, R sided neglect        Exercises     Shoulder Instructions       General Comments VSS, Pt SOB on 2L02 however SpO2 maintaining >94%    Pertinent Vitals/ Pain       Pain Assessment: No/denies pain  Home Living   Living Arrangements: (lives with daughter, Evan [name]) Available Help at Discharge: Family;Available 24 hours/day Type of Home: House   Entrance Stairs-Number of Steps: 4; also has ramp which was for his wife/deceased   Home Layout: Two level;Laundry or work area in basement;Able to live on main level with bedroom/bathroom   Alternate Level Stairs-Rails: (to basement)       Bathroom Accessibility: Yes How Accessible: Accessible via walker     Additional Comments: daughter confirms home set up . one level with laundry in the basement  Lives With: Daughter    Prior Functioning/Environment          Comments: daughter confirms patient's independence pta   Frequency  Min 3X/week        Progress Toward Goals  OT Goals(current goals can now be found in the care plan section)  Progress towards OT goals: Progressing toward goals  Acute Rehab OT Goals Patient Stated Goal: none  stated Time For Goal Achievement: 07/29/18 Potential to Achieve Goals: Good  Plan Discharge plan remains appropriate    Co-evaluation    PT/OT/SLP Co-Evaluation/Treatment: Yes Reason for Co-Treatment: For patient/therapist safety;To address functional/ADL transfers PT goals addressed during session: Mobility/safety with mobility OT goals addressed during session: ADL's and self-care;Strengthening/ROM      AM-PAC OT "6 Clicks" Daily Activity     Outcome Measure   Help from another person eating meals?: A Little Help from another person taking care of personal grooming?: A Lot Help from another person toileting, which includes using toliet, bedpan, or urinal?: Total Help from another person bathing (including washing, rinsing, drying)?: A Lot Help from another person to put on and taking off regular upper body clothing?: A Lot Help from another person to put on and taking off regular lower body clothing?: Total 6 Click Score: 11    End of Session Equipment Utilized During Treatment: Oxygen;Gait belt;Rolling walker  OT Visit Diagnosis: Other abnormalities of gait and mobility (R26.89);Other symptoms and signs involving cognitive function;Hemiplegia and hemiparesis Hemiplegia - Right/Left: Right Hemiplegia - dominant/non-dominant: Dominant Hemiplegia - caused by: Nontraumatic intracerebral hemorrhage  Activity Tolerance Patient tolerated treatment well   Patient Left in chair;with call bell/phone within reach;with chair alarm set   Nurse Communication Mobility status        Time: 4585-9292 OT Time Calculation (min): 28 min  Charges: OT General Charges $OT Visit: 1 Visit OT Treatments $Self Care/Home Management : 8-22 mins  Lou Cal, [place] Pager [phone] Office [phone]    Raymondo Band 07/16/2018, 1:09 PM

## 2018-07-16 NOTE — Progress Notes (Signed)
Patient reported to have upper airway wheezing--reports breathing is "not so good". Lungs with decreased breath sound and upper airway breathing due to mouth breathing. Continue duonebs prn as at home--nurse to administer treatment. Will order CXR for evaluation.  RUE wounds reviewed. See images below--nurse to order Aquacell to help absorption of fluids and promote healing.     Right forearm and upper arm.

## 2018-07-16 NOTE — TOC Initial Note (Signed)
Transition of Care [facility]) - Initial/Assessment Note    Patient Details  Name: Evan [name] MRN: [id number] Date of Birth: [birth year]  [facility] CM/SW Contact:    Pollie Friar, RN Phone Number: 07/16/2018, 11:03 AM  Clinical Narrative:                   Expected Discharge Plan: IP Rehab Facility Barriers to Discharge: Continued Medical Work up   Patient Goals and CMS Choice        Expected Discharge Plan and Services Expected Discharge Plan: [place]   Discharge Planning Services: CM Consult   Living arrangements for the past 2 months: Single Family Home(2 story with his bedroom upstairs)                          Prior Living Arrangements/Services Living arrangements for the past 2 months: Single Family Home(2 story with his bedroom upstairs) Lives with:: Adult Children(daughter) Patient language and need for interpreter reviewed:: Yes(no needs) Do you feel safe going back to the place where you live?: Yes      Need for Family Participation in Patient Care: Yes (Comment)(Will need 24 hour supervision post hospital or rehab) Care giver support system in place?: Yes (comment)(Pt states daughter is able to provide 24 hour supervision) Current home services: DME(walker, cane, 3 in 1, oxygen as needed) Criminal Activity/Legal Involvement Pertinent to Current Situation/Hospitalization: No - Comment as needed  Activities of Daily Living      Permission Sought/Granted   Permission granted to share information with : Yes, Verbal Permission Granted  Share Information with NAME: [name]     Permission granted to share info w Relationship: daughter  Permission granted to share info w Contact Information: [phone]  Emotional Assessment Appearance:: Appears stated age Attitude/Demeanor/Rapport: Engaged(HOH) Affect (typically observed): Accepting, Quiet, Pleasant Orientation: : Oriented to Self, Oriented to Place   Psych Involvement: No  (comment)  Admission diagnosis:  Chronic anticoagulation [Z79.01] Renal insufficiency [N28.9] Normochromic normocytic anemia [D64.9] Thalamic hemorrhage with stroke ([place]) [I61.9] Fall from bed, initial encounter [W06.XXXA] Contusion of jaw, initial encounter [S00.83XA] Skin tear of right upper arm without complication, initial encounter [S41.111A] Patient Active Problem List   Diagnosis Date Noted  . ICH (intracerebral hemorrhage) ([place]) 07/14/2018  . Cellulitis of arm, right 06/01/2018  . Rash and nonspecific skin eruption 06/01/2018  . Chest pain 02/02/2018  . Thrombocytopenia ([place]) 02/02/2018  . Hypokalemia 02/02/2018  . Pulmonary embolism ([place]) 02/02/2018  . Elevated troponin 02/02/2018  . Pulmonary embolus ([place]) 02/02/2018  . Non-ST elevation MI (NSTEMI) ([place]) 06/27/2017  . Diastolic CHF ([place]) 97/67/3419  . Chest pain due to CAD ([place]) 06/26/2017  . Dyspnea 06/21/2017  . Acute bronchitis 05/15/2017  . Oral candidiasis 05/15/2017  . Acute pericarditis   . Chest pain at rest 12/20/2016  . Angina pectoris ([place]) 12/20/2016  . Radiation pneumonitis ([place]) 11/09/2016  . Pleurisy 10/11/2016  . Chest wall pain 10/11/2016  . Atrial fibrillation ([place]) 05/21/2016  . Adenocarcinoma of right lung ([place]) 01/06/2016  . GAD (generalized anxiety disorder) 11/20/2015  . Essential hypertension   . Cellulitis 09/04/2015  . Cellulitis of left axilla   . Esophageal ulcer without bleeding   . Bilateral lower extremity edema 04/28/2014  . BPH (benign prostatic hyperplasia) 10/08/2012  . Anxiety   . EKG abnormalities 09/05/2012  . Tremor 09/05/2012  . Chronic fatigue 09/05/2012  . Arthritis   . Asthma, chronic   .  Reflux esophagitis   . S/P CABG (coronary artery bypass graft)   . GERD (gastroesophageal reflux disease)   . Hyperlipidemia   . Hypertension   . UNSPECIFIED PERIPHERAL VASCULAR DISEASE 06/16/2009   PCP:  [name], MD Pharmacy:   [facility], [address] [place] [address] Phone: [phone] Fax: [phone]  [facility] [place], [address] [place] [address] Phone: [phone] Fax: [phone]  [id number] [address], [place] [address] Phone: [phone] Fax: [phone]  [id number] [place], [place] - [address] [place] [address] Phone: [phone] Fax: [phone]     Social Determinants of Health (SDOH) Interventions  Pt denies issues obtaining or taking meds at home.  Pt states daughter provides all needed transportation.  Readmission Risk Interventions No flowsheet data found.

## 2018-07-16 NOTE — Discharge Summary (Addendum)
Stroke Discharge Summary  Patient ID: demontrae gilbert   MRN: [id number]      DOB: 1927-09-08  Date of Admission: 07/14/2018 Date of Discharge: 07/16/2018  Attending Physician:  Garvin Fila, MD, Stroke MD Consultant(s):   pulmonary/intensive care , [place] RN Patient's PCP:  [name], MD  Discharge Diagnoses:  Principal Problem:   Acute spontaneous 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 ([place]) L thalamic Active Problems:   Hyperlipidemia   Hypertension   S/P CABG (coronary artery bypass graft)   Atrial fibrillation (HCC)   Thrombocytopenia (HCC)   Cellulitis of arm, right   Fall   Adenopathy R paratracheal  Past Medical History:  Diagnosis Date  . Adenocarcinoma of right lung ([place]) 01/06/2016  . Anginal chest pain at rest [facility])    Chronic non, controlled on Lorazepam  . Anxiety   . Arthritis    "all over"   . BPH (benign prostatic hyperplasia)   . CAD (coronary artery disease)   . Chronic lower back pain   . Complication of anesthesia    "problems making water afterwards"  . Depression   . DJD (degenerative joint disease)   . Esophageal ulcer without bleeding    bid ppi indefinitely  . Family history of adverse reaction to anesthesia    "children w/PONV"  . GERD (gastroesophageal reflux disease)   . Headache    "couple/week maybe" (09/04/2015)  . Hemochromatosis    Possible, elevated iron stores, hook-like osteophytes on hand films, normal LFTs  . Hepatitis    "yellow jaundice as a baby"  . History of ASCVD    MULTIVESSEL  . Hyperlipidemia   . Hypertension   . Myocardial infarction ([place]) 1999  . Osteoarthritis   . Pneumonia    "several times; got a little now" (09/04/2015)  . Rheumatoid arthritis ([place])   . Subdural hematoma (HCC)    small after fall 09/2016-plavix held, neurosurgery consulted.  Asymptomatic.    Marland Kitchen 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[place]) 02/2018   on Lovenox lifelong since he failed PO Eliquis   Past Surgical History:   Procedure Laterality Date  . ARM SKIN LESION BIOPSY / EXCISION Left 09/02/2015  . CATARACT EXTRACTION W/ INTRAOCULAR LENS  IMPLANT, BILATERAL Bilateral   . CHOLECYSTECTOMY OPEN    . CORNEAL TRANSPLANT Bilateral    "one at [facility]; one at [facility]"  . CORONARY ANGIOPLASTY WITH STENT PLACEMENT    . CORONARY ARTERY BYPASS GRAFT  1999  . DESCENDING AORTIC ANEURYSM REPAIR W/ STENT     DE stent ostium into the right radial free graft at OM-, 02-2007  . ESOPHAGOGASTRODUODENOSCOPY N/A 11/09/2014   Procedure: ESOPHAGOGASTRODUODENOSCOPY (EGD);  Surgeon: [name], MD;  Location: [facility] ENDOSCOPY;  Service: Endoscopy;  Laterality: N/A;  . INGUINAL HERNIA REPAIR    . JOINT REPLACEMENT    . LEFT HEART CATH AND CORS/GRAFTS ANGIOGRAPHY N/A 06/27/2017   Procedure: LEFT HEART CATH AND CORS/GRAFTS ANGIOGRAPHY;  Surgeon: [name], MD;  Location: [facility] CV LAB;  Service: Cardiovascular;  Laterality: N/A;  . NASAL SINUS SURGERY    . SHOULDER OPEN ROTATOR CUFF REPAIR Right   . TOTAL KNEE ARTHROPLASTY Bilateral     Medications to be continued on Rehab Allergies as of 07/16/2018      Reactions   Feldene [piroxicam] Other (See Comments)   REACTION: Blisters   Statins Other (See Comments)   Myalgias   Doxycycline Other (See Comments)   REACTION:  unknown   Latex Rash  Tequin Anxiety   Valium Other (See Comments)   REACTION:  unknown      Medication List    STOP taking these medications   enoxaparin 80 MG/0.8ML injection Commonly known as:  LOVENOX   metoprolol succinate 25 MG 24 hr tablet Commonly known as:  TOPROL-XL   predniSONE 20 MG tablet Commonly known as:  DELTASONE   predniSONE 5 MG tablet Commonly known as:  DELTASONE     TAKE these medications   acetaminophen 325 MG tablet Commonly known as:  TYLENOL Take 2 tablets (650 mg total) by mouth every 4 (four) hours as needed for mild pain (or temp > 37.5 C (99.5 F)). What changed:    medication strength  how much to take  when  to take this  reasons to take this   camphor-menthol lotion Commonly known as:  SARNA Apply topically as needed for itching. What changed:    how much to take  when to take this   cephALEXin 500 MG capsule Commonly known as:  KEFLEX Take 1 capsule (500 mg total) by mouth every 8 (eight) hours for 2 days. What changed:  when to take this   Cholecalciferol 50 MCG (2000 UT) Tabs Take 1 tablet (2,000 Units total) by mouth daily.   Ensure Take 237 mLs by mouth every other day.   FLUoxetine 20 MG tablet Commonly known as:  PROZAC Take 20 mg by mouth daily.   furosemide 20 MG tablet Commonly known as:  LASIX Take 1 tablet (20 mg total) by mouth daily. Start taking on:  July 17, 2018 What changed:    how much to take  how to take this  when to take this  additional instructions   hydrOXYzine 25 MG tablet Commonly known as:  ATARAX/VISTARIL Take 1 tablet (25 mg total) by mouth every 8 (eight) hours as needed for itching.   LORazepam 0.5 MG tablet Commonly known as:  ATIVAN Take 1 tablet (0.5 mg total) by mouth 3 (three) times daily. What changed:    medication strength  how much to take  how to take this  when to take this  additional instructions   magnesium oxide 400 (241.3 Mg) MG tablet Commonly known as:  MAG-OX Take 1 tablet (400 mg total) by mouth 2 (two) times daily.   metoprolol tartrate 25 MG tablet Commonly known as:  LOPRESSOR Take 0.5 tablets (12.5 mg total) by mouth 2 (two) times daily.   nitroGLYCERIN 0.4 MG SL tablet Commonly known as:  NITROSTAT Place 1 tablet (0.4 mg total) under the tongue every 5 (five) minutes as needed for chest pain.   pantoprazole 40 MG tablet Commonly known as:  PROTONIX Take 1 tablet (40 mg total) by mouth every other day. Start taking on:  July 18, 2018   rosuvastatin 5 MG tablet Commonly known as:  CRESTOR Take 1 tablet (5 mg total) by mouth daily at 6 PM. What changed:    how much to take  how  to take this  when to take this  additional instructions   senna-docusate 8.6-50 MG tablet Commonly known as:  Senokot-S Take 1 tablet by mouth 2 (two) times daily.   tamsulosin 0.4 MG Caps capsule Commonly known as:  FLOMAX Take 1 capsule (0.4 mg total) by mouth daily. Start taking on:  July 17, 2018 What changed:    how much to take  how to take this  when to take this  additional instructions   Xiidra 5 %  Soln Generic drug:  Lifitegrast Place 1-2 drops into both eyes daily.       LABORATORY STUDIES CBC    Component Value Date/Time   WBC 8.6 07/16/2018 0542   RBC 3.74 (L) 07/16/2018 0542   HGB 11.8 (L) 07/16/2018 0542   HGB 13.4 08/16/2016 1155   HCT 35.3 (L) 07/16/2018 0542   HCT 39.8 08/16/2016 1155   PLT 129 (L) 07/16/2018 0542   PLT 81 (L) 08/16/2016 1155   MCV 94.4 07/16/2018 0542   MCV 92.3 08/16/2016 1155   MCH 31.6 07/16/2018 0542   MCHC 33.4 07/16/2018 0542   RDW 14.2 07/16/2018 0542   RDW 13.3 08/16/2016 1155   LYMPHSABS 1.1 07/14/2018 0536   LYMPHSABS 1.3 08/16/2016 1155   MONOABS 1.1 (H) 07/14/2018 0536   MONOABS 1.2 (H) 08/16/2016 1155   EOSABS 0.0 07/14/2018 0536   EOSABS 0.1 08/16/2016 1155   BASOSABS 0.0 07/14/2018 0536   BASOSABS 0.0 08/16/2016 1155   CMP    Component Value Date/Time   NA 136 07/16/2018 0542   NA 143 08/16/2016 1155   K 4.2 07/16/2018 0542   K 4.3 08/16/2016 1155   CL 102 07/16/2018 0542   CO2 21 (L) 07/16/2018 0542   CO2 24 08/16/2016 1155   GLUCOSE 127 (H) 07/16/2018 0542   GLUCOSE 88 08/16/2016 1155   GLUCOSE 120 (H) 04/20/2015 1600   BUN 18 07/16/2018 0542   BUN 14.6 08/16/2016 1155   CREATININE 1.44 (H) 07/16/2018 0542   CREATININE 1.35 (H) 04/20/2018 1048   CREATININE 1.2 08/16/2016 1155   CALCIUM 9.0 07/16/2018 0542   CALCIUM 9.0 08/16/2016 1155   PROT 6.2 (L) 07/14/2018 0536   PROT 6.7 08/16/2016 1155   ALBUMIN 3.3 (L) 07/14/2018 0536   ALBUMIN 3.6 08/16/2016 1155   AST 29 07/14/2018 0536    AST 18 08/16/2016 1155   ALT 16 07/14/2018 0536   ALT 10 08/16/2016 1155   ALKPHOS 113 07/14/2018 0536   ALKPHOS 101 08/16/2016 1155   BILITOT 0.2 (L) 07/14/2018 0536   BILITOT 1.26 (H) 08/16/2016 1155   GFRNONAA 43 (L) 07/16/2018 0542   GFRNONAA 47 (L) 04/20/2018 1048   GFRAA 50 (L) 07/16/2018 0542   GFRAA 54 (L) 04/20/2018 1048   COAGS Lab Results  Component Value Date   INR 1.1 07/14/2018   INR 1.1 06/04/2018   INR 1.23 06/26/2017   Lipid Panel    Component Value Date/Time   CHOL 75 02/03/2018 0524   CHOL 112 01/19/2018 0929   CHOL 97 09/09/2013 0957   TRIG 60 02/03/2018 0524   TRIG 92 09/09/2013 0957   HDL 28 (L) 02/03/2018 0524   HDL 35 (L) 01/19/2018 0929   HDL 37 (L) 09/09/2013 0957   CHOLHDL 2.7 02/03/2018 0524   VLDL 12 02/03/2018 0524   LDLCALC 35 02/03/2018 0524   LDLCALC 59 01/19/2018 0929   LDLCALC 42 09/09/2013 0957   HgbA1C  Lab Results  Component Value Date   HGBA1C 5.3 12/02/2016   Urinalysis    Component Value Date/Time   COLORURINE STRAW (A) 07/14/2018 0536   APPEARANCEUR CLEAR 07/14/2018 0536   LABSPEC 1.005 07/14/2018 0536   PHURINE 7.0 07/14/2018 0536   GLUCOSEU NEGATIVE 07/14/2018 0536   HGBUR NEGATIVE 07/14/2018 0536   BILIRUBINUR NEGATIVE 07/14/2018 0536   KETONESUR NEGATIVE 07/14/2018 0536   PROTEINUR NEGATIVE 07/14/2018 0536   UROBILINOGEN 0.2 07/13/2014 1804   NITRITE NEGATIVE 07/14/2018 0536   LEUKOCYTESUR NEGATIVE 07/14/2018 0536  Urine Drug Screen     Component Value Date/Time   LABOPIA NONE DETECTED 07/14/2018 0536   COCAINSCRNUR NONE DETECTED 07/14/2018 0536   LABBENZ POSITIVE (A) 07/14/2018 0536   AMPHETMU NONE DETECTED 07/14/2018 0536   THCU NONE DETECTED 07/14/2018 0536   LABBARB NONE DETECTED 07/14/2018 0536    Alcohol Level    Component Value Date/Time   ETH <10 07/14/2018 0536     SIGNIFICANT DIAGNOSTIC STUDIES Ct Head Code Stroke Wo Contrast 07/14/2018 IMPRESSION:  1. Acute intraparenchymal  hemorrhage centered at the left thalamus, estimated volume 2.7 cc. Mild surrounding edema without significant regional mass effect.  2. Underlying age-related cerebral atrophy with moderate chronic small vessel ischemic disease.  CT CERVICAL SPINE  IMPRESSION  1. No acute traumatic injury within the cervical spine.  2. Moderate cervical spondylolysis at C3-4 through C6-7, with resultant moderate to severe spinal stenosis at C4-5.   CT MAXILLOFACIAL  IMPRESSION  1. Soft tissue contusion with superimposed 3.4 cm hematoma at the right anterior face/neck.  2. No other acute maxillofacial injury.  No fracture.  3. Chronic left maxillary sinusitis.   CTA HEAD AND NECK  07/14/2018 IMPRESSION  1. Negative CTA for large vessel occlusion. No acute vascular abnormality seen underlying the left thalamic hemorrhage. No aneurysm.  2. Atheromatous change about the carotid bifurcations with up to 50% narrowing on the right and 65% narrowing on the left.  3. Moderate diffuse carotid siphon atherosclerosis with moderate to advanced narrowing, left worse than right.  4. Fetal type origin of the PCAs with overall diminutive vertebrobasilar system.  5. Azygos ACA.  6. Mildly enlarged right paratracheal adenopathy, indeterminate, but could be reactive.   Ct Head Wo Contrast 07/15/2018 IMPRESSION: Stable hemorrhage left thalamus. No change from yesterday.  DG ABD 2 Views 07/15/2018 IMPRESSION: Nonobstructive bowel gas pattern. Nonspecific chronic reticular opacities at the lung bases.  EKG -  SR rate 80 BPM. (See cardiology reading for complete details)  Bilateral Lower Extremity Venous Dopplers 07/14/2018 Summary: Right: There is no evidence of deep vein thrombosis in the lower extremity. However, portions of this examination were limited- see technologist comments above. Left: Findings consistent with chronic deep vein thrombosis involving the left peroneal vein.     HISTORY OF PRESENT  ILLNESS ALEKSA CATTERTON is a 85 y.o. male past medical history of adenocarcinoma of the lung in remission, atrial fibrillation, PE, on therapeutic Lovenox with twice daily dosing, presented to the emergency room after family found him down on the floor having fallen from bed. He was last normal 9 p.m. on 07/13/2018. Received last dose of Lovenox 8 PM on 07/13/2018. He went to bed.  Had a fall in the morning around 2 AM.  Family helped him to bed but then noticed that he was having a difficult time moving the right side and that is why brought him to the hospital. Evaluated in the emergency room, code stroke activated after the EDP called Dr. Rory Percy with concerns for stroke, who saw him in the CT scanner. Initial examination was very difficult as he did not have his hearing aids on for the CT scan but he obviously had some right-sided hemiparesis. CT showed a L thalamic ICH. Premorbid modified Rankin scale (mRS): 2. He was admitted to the neuro ICU.    HOSPITAL COURSE Mr. MIKIAH DEMOND is a 85 y.o. male with history of adenocarcinoma of the lung in remission, Htn, Hld, Hemochromatosis, CAD with stent, atrial fibrillation and PE on therapeutic Lovenox (  failed eliquis), and hearing impaired found on the floor by family with right hemiparesis. He did not receive IV t-PA due to [place].  ICH: Acute small ICH at the left thalamus, likely hypertensive in the setting of therapeutic lovenox coagulopathy  Resultant  Right hemiparesis and ataxia  CT head - Acute intraparenchymal hemorrhage centered at the left thalamus, estimated volume 2.7 cc. Mild surrounding edema without significant regional mass effect.   CTA H&N - Atheromatous change about the carotid bifurcations with up to 50% narrowing on the right and 65% narrowing on the left. Moderate athero b/l siphon, L>R. overall diminutive vertebrobasilar system.   CT repeat (07/15/2018) - Stable hemorrhage left thalamus. No change from yesterday.  2D Echo - 04/2018 - EF  60-65%  UDS - benzodiazepines (on home ativan)  Lovenox 80 mg [place] Q12 hrs prior to admission, now on No antithrombotic given hmg.  Therapy recommendations:  CIR  Disposition:  CIR  PAF Hx of PE  rate controlled  On lovenox therapeutic dose PTA  LE venous doppler - chronic DVT at left peroneal vein.  Currently on no anticoagulant due to [place]  No need of IVC filter as no acute DVT  S/p fall  Facial contusion   C-collar cleared by CCM  Hypertension  Bp as high as 167/137  Treated with cleviprex in ICU  Stable today  Resume home BP meds - metoprolol, lasix  BP goal now normotensive  Hyperlipidemia  Lipid lowering medication PTA: Crestor 5 mg daily  Current lipid lowering medication: resumed crestor  Continue statin at discharge  Other Stroke Risk Factors  Advanced age  Coronary artery disease  Other Active Problems  Hx of Fall with SDH in 09/2016  Mildly enlarged right paratracheal adenopathy, indeterminate, but could be reactive. Adenocarcinoma of the lung in remission.  Creatinine 1.52->1.61->1.44  Thrombocytopenia - 129  R forearm wound, ? Skin tear. WOC rec clean w/ soap and water, apply foam dsg and change q3d   DISCHARGE EXAM Blood pressure (!) 114/59, pulse 86, temperature 98.7 F (37.1 C), temperature source Oral, resp. rate 18, height 5\' 5"  (1.651 m), weight 79.8 kg, SpO2 95 %. General -elderly Caucasian male in no apparent distress,   Ophthalmologic - fundi not visualized due to noncooperation.  Cardiovascular - Regular rate and rhythm.  Mental Status -  Level of arousal and orientation to time, place, and person were intact. Language including expression, naming, repetition, comprehension was assessed and found intact.  Cranial Nerves II - XII - II - Visual field intact OU. III, IV, VI - Extraocular movements intact. V - Facial sensation intact bilaterally. VII - Facial movement intact bilaterally. VIII -significantly  diminished hearing bilaterally. X - Palate elevates symmetrically. XI - Chin turning & shoulder shrug intact bilaterally. XII - Tongue protrusion intact.  Motor Strength - The patient's strength was normal in LUE and LLE, however, mild right hemiparesis with 3/5 weakness proximally and 4/5 distally.  Bulk was normal and fasciculations were absent.   Motor Tone - Muscle tone was assessed at the neck and appendages and was normal.  Reflexes - The patient's reflexes were symmetrical in all extremities and he had no pathological reflexes.  Sensory - Light touch, temperature/pinprick were assessed and were symmetrical.    Coordination - mild ataxia on the RUE and RLE, out of proportion to the weakness.  Tremor was absent.  Gait and Station - deferred.  Discharge Diet  Dysphagia 2 thin liquids  DISCHARGE PLAN  Disposition:  Transfer to  [place] for ongoing PT, OT and ST  R FA wound:  clean w/ soap and water, apply foam dsg and change q3d  Due to hemorrhage and risk of bleeding, do not take aspirin, aspirin-containing medications, or ibuprofen products   Recommend ongoing stroke risk factor control by Primary Care Physician at time of discharge from inpatient rehabilitation.  Follow-up [name], MD in 2 weeks following discharge from rehab.  Follow-up in [facility] in 4 weeks following discharge from rehab, office to schedule an appointment.   35 minutes were spent preparing discharge.  Burnetta Sabin, MSN, APRN, ANVP-BC, AGPCNP-BC Advanced Practice Stroke Nurse [place] for Schedule & Pager information 07/16/2018 1:54 PM  I have personally obtained history,examined this patient, reviewed notes, independently viewed imaging studies, participated in medical decision making and plan of care.ROS completed by me personally and pertinent positives fully documented  I have made any additions or  clarifications directly to the above note. Agree with note above.    Antony Contras, MD Medical Director [facility] Pager: [phone] 07/16/2018 3:14 PM

## 2018-07-16 NOTE — Progress Notes (Signed)
Meredith Staggers, MD  Physician  Physical Medicine and Rehabilitation  PMR Pre-admission  Signed  Date of Service:  07/16/2018 12:31 PM       Related encounter: ED to Hosp-Admission (Discharged) from 07/14/2018 in [facility]      Signed         Show:Clear all [x] Manual[x] Template[x] Copied  Added by: [x] Cristina Gong, RN[x] Meredith Staggers, MD  [] Hover for details PMR Admission Coordinator Pre-Admission Assessment  Patient: Evan [name] is an 85 y.o., male MRN: [id number] DOB: 1927/09/14 Height: 5' 5"  (165.1 cm) Weight: 79.8 kg  Insurance Information HMO: yes    PPO:      PCP:      IPA:      80/20:      OTHER: medicare advantage plan PRIMARY: [employer]      Policy#: [id number]      Subscriber: pt CM Name: Jeanette Caprice Phone#:[phone] ext [phone]     Fax#: [phone] Pre-Cert#: [id number]   Updates due 4/20 to same   Employer: retired Benefits:  Phone #: [phone]     Name: 4/13 Eff. Date: 04/04/2018    Deduct: none      Out of Pocket Max: $3400      Life Max: none CIR: $295 co pay per day days 1 until 6      SNF: no co pay days 1 until 20; $178 co pay y per day days 21 until 100 Outpatient: $10 to $40 co pay per visit     Co-Pay: visits per medical neccesity Home Health: 100%      Co-Pay: visits per medical neccesity DME: 80%     Co-Pay: 20% Providers: in network  SECONDARY: none       Medicaid Application Date:       Case Manager:  Disability Application Date:       Case Worker:   The "Data Collection Information Summary" for patients in Inpatient Rehabilitation Facilities with attached "Privacy Act [place] Records" was provided and verbally reviewed with: Patient and Family  Emergency Contact Information         Contact Information    Name Relation Home Work Mobile   Andreas Blower Daughter [phone]  [phone]   Gillie, Crisci [phone]     Samaj, Wessells [phone]         Current Medical History  Patient Admitting Diagnosis: left thalamic hemorrhage  History of Present Illness: 85 year old male with past medical history of adenocarcinoma of the lung in remission, HTN hemochromatosis, CAD with stent atrial fib, PE, on therapeutic Lovenox ( failed Eliquis)with twice daily dosing, presented to the ER on 07/14/2018 when family found him down after fallen from bed. Having difficulty moving the right side.  CT head with Acute small ICH at the left thalamus, likely hypertensive in the setting of therapeutic lovenox. Resultant hemiparesis and ataxia. 2D echo 04/2018 with EF 60 to 65%, UDS positive for benzos ( on home ativan). VTE prophylaxis SCDs. Holding lovenox. No need of IVC filter as no acute DVT per Neuro.LE venous doppler with chronic DVT at left peroneal vein.  Facial contusion due to fall, C collar cleared by CCM and CT cervical spine with no acute injury.  CT maxillofacial with soft tissue contusion superimposed 3.4 cm hematoma at the right anterior face/neck. No fracture.  Resumed home BP meds of metoprolol, lasix. On Crestor for lipid control .  History of fall with SDH 09/2016.    Complete NIHSS TOTAL:  7  Patient's medical record from [facility]  has been reviewed by the rehabilitation admission coordinator and physician.  Past Medical History      Past Medical History:  Diagnosis Date  . Adenocarcinoma of right lung ([employer]) 01/06/2016  . Anginal chest pain at rest [facility])    Chronic non, controlled on Lorazepam  . Anxiety   . Arthritis    "all over"   . BPH (benign prostatic hyperplasia)   . CAD (coronary artery disease)   . Chronic lower back pain   . Complication of anesthesia    "problems making water afterwards"  . Depression   . DJD (degenerative joint disease)   . Esophageal ulcer without bleeding    bid ppi indefinitely  . Family history of adverse reaction to anesthesia    "children w/PONV"  . GERD  (gastroesophageal reflux disease)   . Headache    "couple/week maybe" (09/04/2015)  . Hemochromatosis    Possible, elevated iron stores, hook-like osteophytes on hand films, normal LFTs  . Hepatitis    "yellow jaundice as a baby"  . History of ASCVD    MULTIVESSEL  . Hyperlipidemia   . Hypertension   . Myocardial infarction ([place]) 1999  . Osteoarthritis   . Pneumonia    "several times; got a little now" (09/04/2015)  . Rheumatoid arthritis ([place])   . Subdural hematoma ([facility])    small after fall 09/2016-plavix held, neurosurgery consulted.  Asymptomatic.    Marland Kitchen 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[place]) 02/2018   on Lovenox lifelong since he failed PO Eliquis    Family History   family history includes Arthritis-Osteo in his sister; Heart attack in his brother and another family member.  Prior Rehab/Hospitalizations Has the patient had prior rehab or hospitalizations prior to admission? Yes  Has the patient had major surgery during 100 days prior to admission? No              Current Medications  Current Facility-Administered Medications:  .  acetaminophen (TYLENOL) tablet 650 mg, 650 mg, Oral, Q4H PRN **OR** [DISCONTINUED] acetaminophen (TYLENOL) solution 650 mg, 650 mg, Per Tube, Q4H PRN **OR** [DISCONTINUED] acetaminophen (TYLENOL) suppository 650 mg, 650 mg, Rectal, Q4H PRN, Amie Portland, MD .  cephALEXin (KEFLEX) capsule 500 mg, 500 mg, Oral, Q8H, Rosalin Hawking, MD, 500 mg at 07/16/18 0601 .  FLUoxetine (PROZAC) capsule 20 mg, 20 mg, Oral, Daily, Rosalin Hawking, MD, 20 mg at 07/16/18 0905 .  furosemide (LASIX) tablet 20 mg, 20 mg, Oral, Daily, Rosalin Hawking, MD, 20 mg at 07/16/18 0905 .  ipratropium-albuterol (DUONEB) 0.5-2.5 (3) MG/3ML nebulizer solution 3 mL, 3 mL, Nebulization, Q6H PRN, Byrum, Rose Fillers, MD .  labetalol (NORMODYNE,TRANDATE) injection 5-10 mg, 5-10 mg, Intravenous, Q2H PRN, Rosalin Hawking, MD .  LORazepam (ATIVAN) tablet 0.5 mg, 0.5 mg, Oral, TID, Rosalin Hawking, MD, 0.5 mg  at 07/16/18 0905 .  magnesium oxide (MAG-OX) tablet 400 mg, 400 mg, Oral, BID, Rosalin Hawking, MD, 400 mg at 07/16/18 0905 .  metoprolol tartrate (LOPRESSOR) tablet 12.5 mg, 12.5 mg, Oral, BID, Rosalin Hawking, MD, 12.5 mg at 07/16/18 0904 .  pantoprazole (PROTONIX) EC tablet 40 mg, 40 mg, Oral, Gennette Pac, MD, 40 mg at 07/16/18 0905 .  rosuvastatin (CRESTOR) tablet 5 mg, 5 mg, Oral, q1800, Rosalin Hawking, MD, 5 mg at 07/15/18 1730 .  senna-docusate (Senokot-S) tablet 1 tablet, 1 tablet, Oral, BID, Amie Portland, MD, 1 tablet at 07/16/18 0905 .  tamsulosin (FLOMAX) capsule 0.4 mg, 0.4 mg, Oral, Daily,  Rosalin Hawking, MD, 0.4 mg at 07/16/18 7782  Patients Current Diet:     Diet Order                  DIET DYS 2 Room service appropriate? No; Fluid consistency: Thin  Diet effective now               Precautions / Restrictions Precautions Precautions: Fall Precaution Comments: bruising along R side of neck Restrictions Weight Bearing Restrictions: No   Has the patient had 2 or more falls or a fall with injury in the past year? No  Prior Activity Level Community (5-7x/wk): Independnet without AD, drove, did own adls  Prior Functional Level Self Care: Did the patient need help bathing, dressing, using the toilet or eating? Independent  Indoor Mobility: Did the patient need assistance with walking from room to room (with or without device)? Independent  Stairs: Did the patient need assistance with internal or external stairs (with or without device)? Independent  Functional Cognition: Did the patient need help planning regular tasks such as shopping or remembering to take medications? Needed some help  Home Assistive Devices / Equipment Home Equipment: Walker - 2 wheels, Shower seat, Kasandra Knudsen - single point  Prior Device Use: Indicate devices/aids used by the patient prior to current illness, exacerbation or injury? None of the above  Current Functional Level  Cognition  Arousal/Alertness: Awake/alert Overall Cognitive Status: Impaired/Different from baseline Current Attention Level: Sustained Orientation Level: Oriented to person, Disoriented to place, Disoriented to time, Disoriented to situation Following Commands: Follows one step commands with increased time, Follows multi-step commands inconsistently Safety/Judgement: Decreased awareness of deficits General Comments: pt extremely HOH which can contribute to difficulty following commands, pt improved from yesterday, R sided neglect Attention: Sustained Sustained Attention: Impaired Sustained Attention Impairment: Verbal basic, Functional basic Memory: Impaired Memory Impairment: Storage deficit, Retrieval deficit, Decreased recall of new information    Extremity Assessment (includes Sensation/Coordination)  Upper Extremity Assessment: RUE deficits/detail RUE Deficits / Details: shoulder elevation 3+/5, shoulder flexion 2-/5, shoulder abduction 3-/5, elbow flexion/extension 3-/5, hand flexion/extension 3-/5; dysmetric  RUE Sensation: decreased light touch, decreased proprioception RUE Coordination: decreased fine motor, decreased gross motor  Lower Extremity Assessment: RLE deficits/detail RLE Deficits / Details: At least anti gravity (3/5) strength, weak ankle dorsiflexion and eversion RLE Sensation: decreased light touch RLE Coordination: decreased gross motor    ADLs  Overall ADL's : Needs assistance/impaired Eating/Feeding: Bed level, Minimal assistance Eating/Feeding Details (indicate cue type and reason): min assist to setup using L UE, cueing to use L UE to increase ease and independence at this time  Grooming: Oral care, Wash/dry face, Minimal assistance, Moderate assistance, Sitting Grooming Details (indicate cue type and reason): hand over hand assist to incorporate RUE into task - pt noted to have food pocketed in mouth during completion of oral care, also requires  questioning cues to sequence through task as pt attempting to brush teeth without applying toothpaste; requires increased assist with dynamic sitting balance EOB Upper Body Bathing: Moderate assistance, Sitting Lower Body Bathing: Maximal assistance, Bed level Upper Body Dressing : Moderate assistance, Sitting Lower Body Dressing: Total assistance, Sitting/lateral leans Toilet Transfer Details (indicate cue type and reason): deferred due to safety Functional mobility during ADLs: Moderate assistance, +2 for physical assistance, +2 for safety/equipment(stand pivot transfers) General ADL Comments: pt limited by cognition, R hemiparesis, impaired balance and decreased act tolerance    Mobility  Overal bed mobility: Needs Assistance Bed Mobility: Supine to Sit Supine  to sit: Min assist Sit to supine: Mod assist General bed mobility comments: max directional v/c's, R LE requiring increased time to move towards R side of bed, pt used L UE to reach for bed rail and pull up, HOB slightly elevated    Transfers  Overall transfer level: Needs assistance Equipment used: Rolling walker (2 wheeled), 2 person hand held assist Transfers: Sit to/from Stand, Stand Pivot Transfers Sit to Stand: Min assist, Mod assist, +2 physical assistance Stand pivot transfers: Mod assist, +2 physical assistance General transfer comment: completed several sit/stand attempts, with and without RW. Pt initially can power up and stand with R LE locked in extension however pt fatigues and quickly buckles/demos R knee instability, pt able to advance R LE however ataxic and difficulty placing and controlling, able to lift L LE x2 prior to R knee buckling requiring modA to block R knee. Pt requierd modAX2 for std pvt to chair as patient unable to sequence or coordinate setpping pattern. Pt attempted to stand up to RW however pt unable to grip wih R hand and with signifficant  Rlateral lean without less physical assist     Ambulation / Gait / Stairs / Wheelchair Mobility       Posture / Balance Dynamic Sitting Balance Sitting balance - Comments: pt able to maintain midline posture statically but with strong lean to the R with dynamic activity Balance Overall balance assessment: Needs assistance Sitting-balance support: No upper extremity supported, Feet supported Sitting balance-[name] Scale: Fair Sitting balance - Comments: pt able to maintain midline posture statically but with strong lean to the R with dynamic activity Postural control: Right lateral lean Standing balance-[name] Scale: Poor Standing balance comment: dependent on physical assist    Special needs/care consideration BiPAP/CPAP  N/a CPM  N/a Continuous Drip IV  N/a Dialysis  N/a Life Vest  N/a Oxygen  N/a Special Bed  N/a Trach Size  N/a Wound Vac n/a Skin      Meryle Ready, RN  Registered Nurse  WOC  Consult Note   Signed   Date of Service:  07/16/2018 9:14 AM            Signed         Show:Clear all [x] ?Manual[x] ?Template[] ?Copied  Added by: [x] ?Meryle Ready, RN  [] ?Hover for details [place] Nurse wound consult note Patient receiving care in [facility] 3W10. Reason for Consult:Right forearm wound Wound type:healing skin tear? Patient unable to contribute to how wound formed Measurement:3.3 cm x 2.3 cm x no measureable depth Wound bed:100% pink, clean Drainage (amount, consistency, odor)yellow on existing foam dressing. Periwound:intact Dressing procedure/placement/frequency:Cleanse area with soap and water. Pat dry. Apply a foam dressing. Change every 3 days. Monitor the wound area(s) for worsening of condition such as: Signs/symptoms of infection,  Increase in size,  Development of or worsening of odor, Development of pain, or increased pain at the affected locations. Notify the medical team if any of these develop.  Thank you for the consult. Discussed plan of care with the patient and  bedside nurse. [place] nurse will not follow at this time. Please re-consult the [place] team if needed.  Val Riles, RN, MSN, CWOCN, CNS-BC, pager [phone]         Abrasion and ecchymosis to right jaw and neck extensive; MASD buttocks  Bowel mgmt: no LBM documented Bladder mgmt: incontinent; external catheter Diabetic mgmt: n/a Behavioral consideration n/a Chemo/radiation  N/a VERY HOH has bilateral hearing aids that he will need assistance in changing batteries  and the care of    Previous Home Environment  Living Arrangements: (lives with daughter, [name])  Lives With: Daughter Available Help at Discharge: Family, Available 24 hours/day Type of Home: House Home Layout: Two level, Laundry or work area in basement, Able to live on main level with bedroom/bathroom Alternate Level Stairs-Rails: (to basement) Home Access: Stairs to enter [employer] of Steps: 4; also has ramp which was for his wife/deceased Bathroom Shower/Tub: Multimedia programmer: Handicapped height Bathroom Accessibility: Yes How Accessible: Accessible via [place]: Yes Type of [place] (if known): [facility] in the past Additional Comments: daughter confirms home set up . one level with laundry in the basement  Discharge Living Setting Plans for Discharge Living Setting: Lives with (comment)(daughter, Shedd) Type of Home at Discharge: House Discharge Home Layout: Two level, Laundry or work area in basement, [employer] on main level, Able to live on main level with bedroom/bathroom Alternate Level Stairs-Number of Steps: flight down to basement Discharge Home Access: Stairs to enter [employer] of Steps: 4; also has ramp which was for his wife/deceased Discharge Bathroom Shower/Tub: Horticulturist, commercial: Standard Discharge Bathroom Accessibility: Yes How Accessible: Accessible  via walker  Social/Family/Support Systems Patient Roles: Parent Contact Information: daughter, [name] main contact Anticipated Caregiver: two daughters and 3 sons Anticipated Caregiver's Contact Information: [name], daughter Ability/Limitations of Caregiver: no limitations Caregiver Availability: 24/7 Discharge Plan Discussed with Primary Caregiver: Yes Is Caregiver In Agreement with Plan?: Yes Does Caregiver/Family have Issues with Lodging/Transportation while Pt is in Rehab?: No  Goals/Additional Needs Patient/Family Goal for Rehab: supervision to min PR and OT, supervision SLP Expected length of stay: ELOS 10 to 14 days Equipment Needs: Very HOH; has two hearing aids but questionable if working Pt/Family Agrees to Admission and willing to participate: Yes Program Orientation Provided & Reviewed with Pt/Caregiver Including Roles  & Responsibilities: Yes  Decrease burden of Care through IP rehab admission: n/a  Possible need for SNF placement upon discharge:  Not anticipated  Patient Condition: I have reviewed medical records from [facility], spoken with patient, and daughter. I met with patient at the bedside for inpatient rehabilitation assessment.  Patient will benefit from ongoing PT, OT and SLP, can actively participate in 3 hours of therapy a day 5 days of the week, and can make measurable gains during the admission.  Patient will also benefit from the coordinated team approach during an Inpatient Acute Rehabilitation admission.  The patient will receive intensive therapy as well as Rehabilitation physician, nursing, social worker, and care management interventions.  Due to bladder management, bowel management, safety, skin/wound care, disease management, medication administration and patient education the patient requires 24 hour a day rehabilitation nursing.  The patient is currently mod assist with mobility and basic ADLs.  Discharge setting and therapy post  discharge at home with home health is anticipated.  Patient has agreed to participate in the Acute Inpatient Rehabilitation Program and will admit today.  Preadmission Screen Completed By:  Cleatrice Burke, 07/16/2018 1:23 PM ______________________________________________________________________   Discussed status with Dr. Naaman Plummer on  07/16/2018 at 1323 and received approval for admission today.  Admission Coordinator:  Cleatrice Burke, RN MSN time 1323 Date 07/16/2018   Assessment/Plan: Diagnosis: left thalamic hemorrhage 1. Does the need for close, 24 hr/day Medical supervision in concert with the patient's rehab needs make it unreasonable for this patient to be  served in a less intensive setting? Yes 2. Co-Morbidities requiring supervision/potential complications: lung ca, HTN, CAD, afib 3. Due to bladder management, bowel management, safety, skin/wound care, disease management, medication administration, pain management and patient education, does the patient require 24 hr/day rehab nursing? Yes 4. Does the patient require coordinated care of a physician, rehab nurse, PT (1-2 hrs/day, 5 days/week), OT (1-2 hrs/day, 5 days/week) and SLP (1-2 hrs/day, 5 days/week) to address physical and functional deficits in the context of the above medical diagnosis(es)? Yes Addressing deficits in the following areas: balance, endurance, locomotion, strength, transferring, bowel/bladder control, bathing, dressing, feeding, grooming, toileting, cognition, speech and psychosocial support 5. Can the patient actively participate in an intensive therapy program of at least 3 hrs of therapy 5 days a week? Yes 6. The potential for patient to make measurable gains while on inpatient rehab is excellent 7. Anticipated functional outcomes upon discharge from inpatients are: supervision and min assist PT, supervision and min assist OT, supervision SLP 8. Estimated rehab length of stay to reach the above  functional goals is: 11-15 days 9. Anticipated D/C setting: Home 10. Anticipated post D/C treatments: [place] therapy 11. Overall Rehab/Functional Prognosis: excellent  MD Signature: Meredith Staggers, MD, [place] [facility] 07/16/2018         Revision History

## 2018-07-16 NOTE — Evaluation (Signed)
Speech Language Pathology Evaluation Patient Details Name: Evan [name] MRN: [id number] DOB: [birth year] Today's Date: 07/16/2018 Time: 4665-9935 SLP Time Calculation (min) (ACUTE ONLY): 20 min  Problem List:  Patient Active Problem List   Diagnosis Date Noted  . Fall 07/16/2018  . 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 Cellulitis of arm, right 06/01/2018  . Rash and nonspecific skin eruption 06/01/2018  . Chest pain 02/02/2018  . Thrombocytopenia ([place]) 02/02/2018  . Hypokalemia 02/02/2018  . Pulmonary embolism ([place]) 02/02/2018  . Elevated troponin 02/02/2018  . Pulmonary embolus ([place]) 02/02/2018  . Non-ST elevation MI (NSTEMI) ([place]) 06/27/2017  . Diastolic CHF ([place]) 70/17/7939  . Chest pain due to CAD ([place]) 06/26/2017  . Dyspnea 06/21/2017  . Acute bronchitis 05/15/2017  . Oral candidiasis 05/15/2017  . Acute pericarditis   . Chest pain at rest 12/20/2016  . Angina pectoris ([place]) 12/20/2016  . Radiation pneumonitis ([place]) 11/09/2016  . Pleurisy 10/11/2016  . Chest wall pain 10/11/2016  . Atrial fibrillation ([place]) 05/21/2016  . Adenocarcinoma of right lung ([place]) 01/06/2016  . GAD (generalized anxiety disorder) 11/20/2015  . Essential hypertension   . Cellulitis 09/04/2015  . Cellulitis of left axilla   . Esophageal ulcer without bleeding   . Bilateral lower extremity edema 04/28/2014  . BPH (benign prostatic hyperplasia) 10/08/2012  . Anxiety   . EKG abnormalities 09/05/2012  . Tremor 09/05/2012  . Chronic fatigue 09/05/2012  . Arthritis   . Asthma, chronic   . Reflux esophagitis   . S/P CABG (coronary artery bypass graft)   . GERD (gastroesophageal reflux disease)   . Hyperlipidemia   . Hypertension   . UNSPECIFIED PERIPHERAL VASCULAR DISEASE 06/16/2009   Past Medical History:  Past Medical History:  Diagnosis Date  . Adenocarcinoma of right  lung ([place]) 01/06/2016  . Anginal chest pain at rest [facility])    Chronic non, controlled on Lorazepam  . Anxiety   . Arthritis    "all over"   . BPH (benign prostatic hyperplasia)   . CAD (coronary artery disease)   . Chronic lower back pain   . Complication of anesthesia    "problems making water afterwards"  . Depression   . DJD (degenerative joint disease)   . Esophageal ulcer without bleeding    bid ppi indefinitely  . Family history of adverse reaction to anesthesia    "children w/PONV"  . GERD (gastroesophageal reflux disease)   . Headache    "couple/week maybe" (09/04/2015)  . Hemochromatosis    Possible, elevated iron stores, hook-like osteophytes on hand films, normal LFTs  . Hepatitis    "yellow jaundice as a baby"  . History of ASCVD    MULTIVESSEL  . Hyperlipidemia   . Hypertension   . Myocardial infarction ([place]) 1999  . Osteoarthritis   . Pneumonia    "several times; got a little now" (09/04/2015)  . Rheumatoid arthritis ([place])   . Subdural hematoma ([facility])    small after fall 09/2016-plavix held, neurosurgery consulted.  Asymptomatic.    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 ARM SKIN LESION BIOPSY / EXCISION Left 09/02/2015  . CATARACT EXTRACTION W/ INTRAOCULAR LENS  IMPLANT, BILATERAL Bilateral   . CHOLECYSTECTOMY OPEN    . CORNEAL TRANSPLANT Bilateral    "one at [facility]; one at  [facility]"  . CORONARY ANGIOPLASTY WITH STENT PLACEMENT    . CORONARY ARTERY BYPASS GRAFT  1999  . DESCENDING AORTIC ANEURYSM REPAIR W/ STENT     DE stent ostium into the right radial free graft at OM-, 02-2007  . ESOPHAGOGASTRODUODENOSCOPY N/A 11/09/2014   Procedure: ESOPHAGOGASTRODUODENOSCOPY (EGD);  Surgeon: [name], MD;  Location: [facility] ENDOSCOPY;  Service: Endoscopy;  Laterality: N/A;  . INGUINAL HERNIA REPAIR    . JOINT REPLACEMENT    . LEFT HEART CATH AND CORS/GRAFTS  ANGIOGRAPHY N/A 06/27/2017   Procedure: LEFT HEART CATH AND CORS/GRAFTS ANGIOGRAPHY;  Surgeon: [name], MD;  Location: [facility] CV LAB;  Service: Cardiovascular;  Laterality: N/A;  . NASAL SINUS SURGERY    . SHOULDER OPEN ROTATOR CUFF REPAIR Right   . TOTAL KNEE ARTHROPLASTY Bilateral    HPI:  85 year old male admitted 07/14/2018 Pt is a 85 y/o male presenting after a fall and R sided weakness. CT revealed L thalamic ICH. PMH: adenocarcinoma of R lung, anxiety, arthritis, CAD, chronic back pain, depression, hepatitis, pneumonia, subdural hematoma.    Assessment / Plan / Recommendation Clinical Impression  The Mini-Mental State Examination (MMSE) was administered. Pt reports having a 7th grade education. Pt scored 10/30 on MMSE, indicating significant cognitive impairment, with errors in orientation, attention, delayed recall, following multi-step commands, writing and copying. Pt also has marked hearing impairment, with only right hearing aid in place. This may adversely affect performance on evaluation. Based on this information, 24 hour supervision is recommended after DC.     SLP Assessment  SLP Recommendation/Assessment: All further Speech Language Pathology needs can be addressed in the next venue of care SLP Visit Diagnosis: Cognitive communication deficit (R41.841)    Follow Up Recommendations  24 hour supervision/assistance       SLP Evaluation Cognition  Overall Cognitive Status: (P) Impaired/Different from baseline Arousal/Alertness: Awake/alert Orientation Level: Oriented to person;Disoriented to place;Disoriented to time;Disoriented to situation Attention: Sustained Sustained Attention: Impaired Sustained Attention Impairment: Verbal basic;Functional basic Memory: Impaired Memory Impairment: Storage deficit;Retrieval deficit;Decreased recall of new information       Comprehension  Auditory Comprehension Overall Auditory Comprehension: Appears within functional  limits for tasks assessed Interfering Components: Hearing    Expression Expression Primary Mode of Expression: Verbal Verbal Expression Overall Verbal Expression: Appears within functional limits for tasks assessed Written Expression Dominant Hand: Right   Oral / Motor  Oral Motor/Sensory Function Overall Oral Motor/Sensory Function: Within functional limits Motor Speech Overall Motor Speech: Appears within functional limits for tasks assessed   GO                   Yonah Tangeman B. Quentin Ore [facility] [place] Speech Language Pathologist [phone]  Jakub, De[place] 07/16/2018, 1:05 PM

## 2018-07-16 NOTE — Evaluation (Signed)
Clinical/Bedside Swallow Evaluation Patient Details  Name: Evan [name] MRN: [id number] Date of Birth: [birth year]  Today's Date: 07/16/2018 Time: SLP Start Time (ACUTE ONLY): 2774 SLP Stop Time (ACUTE ONLY): 1300 SLP Time Calculation (min) (ACUTE ONLY): 25 min  Past Medical History:  Past Medical History:  Diagnosis Date  . Adenocarcinoma of right lung ([place]) 01/06/2016  . Anginal chest pain at rest [facility])    Chronic non, controlled on Lorazepam  . Anxiety   . Arthritis    "all over"   . BPH (benign prostatic hyperplasia)   . CAD (coronary artery disease)   . Chronic lower back pain   . Complication of anesthesia    "problems making water afterwards"  . Depression   . DJD (degenerative joint disease)   . Esophageal ulcer without bleeding    bid ppi indefinitely  . Family history of adverse reaction to anesthesia    "children w/PONV"  . GERD (gastroesophageal reflux disease)   . Headache    "couple/week maybe" (09/04/2015)  . Hemochromatosis    Possible, elevated iron stores, hook-like osteophytes on hand films, normal LFTs  . Hepatitis    "yellow jaundice as a baby"  . History of ASCVD    MULTIVESSEL  . Hyperlipidemia   . Hypertension   . Myocardial infarction ([place]) 1999  . Osteoarthritis   . Pneumonia    "several times; got a little now" (09/04/2015)  . Rheumatoid arthritis ([place])   . Subdural hematoma ([facility])    small after fall 09/2016-plavix held, neurosurgery consulted.  Asymptomatic.    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 ARM SKIN LESION BIOPSY / EXCISION Left 09/02/2015  . CATARACT EXTRACTION W/ INTRAOCULAR LENS  IMPLANT, BILATERAL Bilateral   . CHOLECYSTECTOMY OPEN    . CORNEAL TRANSPLANT Bilateral    "one at [facility]; one at [facility]"  . CORONARY ANGIOPLASTY WITH STENT PLACEMENT    . CORONARY ARTERY BYPASS GRAFT  1999  . DESCENDING AORTIC ANEURYSM  REPAIR W/ STENT     DE stent ostium into the right radial free graft at OM-, 02-2007  . ESOPHAGOGASTRODUODENOSCOPY N/A 11/09/2014   Procedure: ESOPHAGOGASTRODUODENOSCOPY (EGD);  Surgeon: [name], MD;  Location: [facility] ENDOSCOPY;  Service: Endoscopy;  Laterality: N/A;  . INGUINAL HERNIA REPAIR    . JOINT REPLACEMENT    . LEFT HEART CATH AND CORS/GRAFTS ANGIOGRAPHY N/A 06/27/2017   Procedure: LEFT HEART CATH AND CORS/GRAFTS ANGIOGRAPHY;  Surgeon: [name], MD;  Location: [place] CV LAB;  Service: Cardiovascular;  Laterality: N/A;  . NASAL SINUS SURGERY    . SHOULDER OPEN ROTATOR CUFF REPAIR Right   . TOTAL KNEE ARTHROPLASTY Bilateral    HPI:  85 year old male admitted 07/14/2018 Pt is a 85 y/o male presenting after a fall and R sided weakness. CT revealed L thalamic ICH. PMH: adenocarcinoma of R lung, anxiety, arthritis, CAD, chronic back pain, depression, hepatitis, pneumonia, subdural hematoma.    Assessment / Plan / Recommendation Clinical Impression  Orders for BSE requested following PT observation of pocketing during their session today. Pt has adequate dentition, and oral motor strength and function appear adequate. Pt was seen during lunch, and tolerated trials of thin liquid, puree, and solid consistencies. liquids and puree were managed well, with minimal anterior leakage and oral residue noted. Pt was impulsive with solids, continuing to take bites with considerable residue  still in his mouth.  Pt also appeared to have difficulty with effective mastication of chicken (dry, tough), and required liquid wash to moisten for clearing.   Recommend change to dys 2/thin liquids for ease of mastication. Also recommend assistance with po intake given cognitive impairment and impulsivity. RN informed, safe swallow precautions posted at [facility]. ST will follow for assessment of diet tolerance and education.   SLP Visit Diagnosis: Dysphagia, unspecified (R13.10)    Aspiration Risk  Moderate  aspiration risk    Diet Recommendation Dysphagia 2 (Fine chop);Thin liquid   Liquid Administration via: Straw;Cup Medication Administration: Whole meds with liquid Supervision: Staff to assist with self feeding;Patient able to self feed Compensations: Slow rate;Small sips/bites;Minimize environmental distractions Postural Changes: Seated upright at 90 degrees;Remain upright for at least 30 minutes after po intake    Other  Recommendations Oral Care Recommendations: Oral care before and after PO Other Recommendations: Have oral suction available   Follow up Recommendations 24 hour supervision/assistance      Frequency and Duration min 1 x/week  2 weeks;1 week       Prognosis Prognosis for Safe Diet Advancement: Fair Barriers to Reach Goals: Cognitive deficits      Swallow Study   General Date of Onset: 07/14/18 HPI: 85 year old male admitted 07/14/2018 Pt is a 85 y/o male presenting after a fall and R sided weakness. CT revealed L thalamic ICH. PMH: adenocarcinoma of R lung, anxiety, arthritis, CAD, chronic back pain, depression, hepatitis, pneumonia, subdural hematoma.  Type of Study: Bedside Swallow Evaluation Previous Swallow Assessment: none found Diet Prior to this Study: Regular;Thin liquids Temperature Spikes Noted: No Respiratory Status: Nasal cannula History of Recent Intubation: No Behavior/Cognition: Alert;Cooperative;Confused;Pleasant mood;Requires cueing;Distractible Oral Cavity Assessment: Within Functional Limits Oral Care Completed by SLP: Yes Oral Cavity - Dentition: Adequate natural dentition Vision: Functional for self-feeding Self-Feeding Abilities: Able to feed self Patient Positioning: Upright in bed Baseline Vocal Quality: Normal Volitional Cough: Cognitively unable to elicit Volitional Swallow: Unable to elicit    Oral/Motor/Sensory Function Overall Oral Motor/Sensory Function: Within functional limits   Ice Chips Ice chips: Not tested   Thin  Liquid Thin Liquid: Within functional limits Presentation: Straw    Nectar Thick Nectar Thick Liquid: Not tested   Honey Thick Honey Thick Liquid: Not tested   Puree Puree: Within functional limits Presentation: Spoon;Self Fed   Solid     Solid: Impaired Oral Phase Impairments: Poor awareness of bolus;Impaired mastication Oral Phase Functional Implications: Prolonged oral transit;Impaired mastication;Oral residue;Right anterior spillage Pharyngeal Phase Impairments: Suspected delayed Swallow     Celia B. Quentin Ore [facility], [place] Speech Language Pathologist [phone]  Anis, Degidio 07/16/2018,1:19 PM

## 2018-07-16 NOTE — H&P (Signed)
Physical Medicine and Rehabilitation Admission H&P    Chief Complaint  Patient presents with   Functional defi    HPI: Evan [name] is an 85 year old male with history of CAD, RA, RLL adenocarcinoma, possible radiation pneumonitis, A fib, DVT/PE--on treatment dose Lovenox, SDH 09/2016, recent admission for RUE hematoma/cellulitis, chronic thrombocytopenia, who was admitted on 07/14/18 after being found on the floor with difficulty moving right side.  CT head done revealing acute left thalamic intraparenchymal hemorrhage felt to be likely hypertensive in setting of coagulopathy.  Elevated blood pressure treated with Cleviprex and Lovenox DC'd..  Lovenox placed on hold and Dr. Lamonte Sakai recommended checking BLE Dopplers as well as IVC filter if significant lower extremity clot burden noticed.  Dopplers revealed chronic left peroneal vein DVT therefore no need for IVC filter but PCCM recommending resuming anticoagulation when deemed safe.  CTA head neck showed atheromatous change about carotid bifurcation with  50% narrowing right and 65% narrowing left and mildly enlarged right paratracheal adenopathy--indeterminate/question reactive.  Maxillofacial CT showed soft tissue contusion with 3.4 cm hematoma right anterior face/neck.  CT cervical spine was negative for injury and showed moderate cervical spondylosis C3-4 through C6-7 with moderate to severe spinal stenosis see 4/5.  Follow-up CT head 4/12 showed stable left thalamic hemorrhage and neurology recommends holding all anticoagulation at this time. He was started on dysphagia 2 diet and currently noted to have significant cognitive impairment but could be affected by significant hearing loss.     Review of Systems  Unable to perform ROS: Other  Genitourinary:       Incontinent?    Past Medical History:  Diagnosis Date   Adenocarcinoma of right lung ([place]) 01/06/2016   Anginal chest pain at rest [facility])    Chronic non, controlled on Lorazepam    Anxiety    Arthritis    "all over"    BPH (benign prostatic hyperplasia)    CAD (coronary artery disease)    Chronic lower back pain    Complication of anesthesia    "problems making water afterwards"   Depression    DJD (degenerative joint disease)    Esophageal ulcer without bleeding    bid ppi indefinitely   Family history of adverse reaction to anesthesia    "children w/PONV"   GERD (gastroesophageal reflux disease)    Headache    "couple/week maybe" (09/04/2015)   Hemochromatosis    Possible, elevated iron stores, hook-like osteophytes on hand films, normal LFTs   Hepatitis    "yellow jaundice as a baby"   History of ASCVD    MULTIVESSEL   Hyperlipidemia    Hypertension    Myocardial infarction ([place]) 1999   Osteoarthritis    Pneumonia    "several times; got a little now" (09/04/2015)   Rheumatoid arthritis ([place])    Subdural hematoma ([place])    small after fall 09/2016-plavix held, neurosurgery consulted.  Asymptomatic.     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[place]) 02/2018   on Lovenox lifelong since he failed PO Eliquis    Past Surgical History:  Procedure Laterality Date   ARM SKIN LESION BIOPSY / EXCISION Left 09/02/2015   CATARACT EXTRACTION W/ INTRAOCULAR LENS  IMPLANT, BILATERAL Bilateral    CHOLECYSTECTOMY OPEN     CORNEAL TRANSPLANT Bilateral    "one at [facility]; one at [facility]"   [place]     [place]  stent ostium into the right radial free graft at OM-, 02-2007   ESOPHAGOGASTRODUODENOSCOPY N/A 11/09/2014   Procedure: ESOPHAGOGASTRODUODENOSCOPY (EGD);  Surgeon: [name], MD;  Location: [facility] ENDOSCOPY;  Service: Endoscopy;  Laterality: N/A;   INGUINAL HERNIA REPAIR     JOINT REPLACEMENT     LEFT HEART CATH AND CORS/GRAFTS ANGIOGRAPHY N/A 06/27/2017   Procedure: LEFT HEART CATH AND CORS/GRAFTS ANGIOGRAPHY;  Surgeon: [name], MD;   Location: [place] CV LAB;  Service: Cardiovascular;  Laterality: N/A;   NASAL SINUS SURGERY     SHOULDER OPEN ROTATOR CUFF REPAIR Right    TOTAL KNEE ARTHROPLASTY Bilateral     Family History  Problem Relation Age of Onset   Arthritis-Osteo Sister    Heart attack Brother    Heart attack Other    Cancer Neg Hx     Social History:  reports that he has never smoked. He has quit using smokeless tobacco.  His smokeless tobacco use included chew. He reports that he does not drink alcohol or use drugs.    Allergies  Allergen Reactions   Feldene [Piroxicam] Other (See Comments)    REACTION: Blisters   Statins Other (See Comments)    Myalgias   Doxycycline Other (See Comments)    REACTION:  unknown   Latex Rash   Tequin Anxiety   Valium Other (See Comments)    REACTION:  unknown    Medications Prior to Admission  Medication Sig Dispense Refill   acetaminophen (TYLENOL) 500 MG tablet Take 1,000 mg by mouth every 6 (six) hours as needed for mild pain.     camphor-menthol (SARNA) lotion Apply topically as needed for itching. (Patient taking differently: Apply 1 application topically daily as needed for itching. ) 222 mL 0   cephALEXin (KEFLEX) 500 MG capsule Take 1 capsule (500 mg total) by mouth 4 (four) times daily. 28 capsule 0   Cholecalciferol 2000 units TABS Take 1 tablet (2,000 Units total) by mouth daily. 30 each 11   enoxaparin (LOVENOX) 80 MG/0.8ML injection Inject 0.8 mLs (80 mg total) into the skin every 12 (twelve) hours.     ENSURE (ENSURE) Take 237 mLs by mouth every other day.      FLUoxetine (PROZAC) 20 MG tablet Take 20 mg by mouth daily.     furosemide (LASIX) 20 MG tablet Take 1 tablet (20 mg total) once a day. Take an extra tablet (20 mg total) for increased swelling, weight gain, or shortness of breath. 180 tablet 3   hydrOXYzine (ATARAX/VISTARIL) 25 MG tablet Take 1 tablet (25 mg total) by mouth every 8 (eight) hours as needed for  itching. 20 tablet 0   Lifitegrast (XIIDRA) 5 % SOLN Place 1-2 drops into both eyes daily.      LORazepam (ATIVAN) 1 MG tablet Taking 1mg  in the AM, 0.5mg  (1/2 tablet) at lunch and 1/2 tablet (0.5mg ) at bedtime. 90 tablet 0   magnesium oxide (MAG-OX) 400 (241.3 Mg) MG tablet Take 1 tablet (400 mg total) by mouth 2 (two) times daily. 180 tablet 3   metoprolol succinate (TOPROL-XL) 25 MG 24 hr tablet TAKE ONE (1) TABLET BY MOUTH EVERY DAY 90 tablet 3   nitroGLYCERIN (NITROSTAT) 0.4 MG SL tablet Place 1 tablet (0.4 mg total) under the tongue every 5 (five) minutes as needed for chest pain. 25 tablet 2   pantoprazole (PROTONIX) 40 MG tablet Take one (1) tablet (40 mg) by mouth daily. (Patient taking differently: Take 40 mg by mouth every  other day. ) 90 tablet 3   predniSONE (DELTASONE) 20 MG tablet 3 tabs poqday 1-2, 2 tabs poqday 3-4, 1 tab poqday 5-6 (Patient taking differently: Take 20-60 mg by mouth See admin instructions. 3 tabs poqday 1-2, 2 tabs poqday 3-4, 1 tab poqday 5-6) 12 tablet 0   rosuvastatin (CRESTOR) 5 MG tablet TAKE ONE (1) TABLET BY MOUTH EVERY DAY 90 tablet 3   tamsulosin (FLOMAX) 0.4 MG CAPS capsule TAKE 1 CAPSULE EVERY DAY 90 capsule 3   predniSONE (DELTASONE) 5 MG tablet Label  & dispense according to the schedule below. take 8 Pills PO for 3 days, 6 Pills PO for 3 days, 4 Pills PO for 3 days, 2 Pills PO for 3 days, 1 Pills PO for 3 days, 1/2 Pill  PO for 3 days then STOP. Total 65 pills. (Patient not taking: Reported on 07/14/2018) 65 tablet 0    Drug Regimen Review  Drug regimen was reviewed and remains appropriate with no significant issues identified  Home: Home Living Family/patient expects to be discharged to:: Private residence Living Arrangements: Children(son) Available Help at Discharge: Family, Available 24 hours/day Type of Home: House Home Access: Stairs to enter [employer] of Steps: 4 Home Layout: Able to live on main level with  bedroom/bathroom, Laundry or work area in basement [employer] Shower/Tub: Holiday representative Toilet: Handicapped height [place]: Environmental consultant - 2 wheels, [place] - single point Additional Comments: per chart review, pt unable to report home setup at this time    Functional History: Prior Function Level of Independence: Independent Comments: pt reports independent mobility and self care, and per chart review independent; diffcult to discern as pt HOH   Functional Status:  Mobility: Bed Mobility Overal bed mobility: Needs Assistance Bed Mobility: Supine to Sit Supine to sit: Min assist Sit to supine: Mod assist General bed mobility comments: max directional v/c's, R LE requiring increased time to move towards R side of bed, pt used L UE to reach for bed rail and pull up, HOB slightly elevated Transfers Overall transfer level: Needs assistance Equipment used: Rolling walker (2 wheeled), 2 person hand held assist Transfers: Sit to/from Stand, Stand Pivot Transfers Sit to Stand: Min assist, Mod assist, +2 physical assistance Stand pivot transfers: Mod assist, +2 physical assistance General transfer comment: completed several sit/stand attempts, with and without RW. Pt initially can power up and stand with R LE locked in extension however pt fatigues and quickly buckles/demos R knee instability, pt able to advance R LE however ataxic and difficulty placing and controlling, able to lift L LE x2 prior to R knee buckling requiring modA to block R knee. Pt requierd modAX2 for std pvt to chair as patient unable to sequence or coordinate setpping pattern. Pt attempted to stand up to RW however pt unable to grip wih R hand and with signifficant  Rlateral lean without less physical assist      ADL: ADL Overall ADL's : Needs assistance/impaired Eating/Feeding: Bed level, Minimal assistance Eating/Feeding Details (indicate cue type and reason): min assist to setup using L UE, cueing to  use L UE to increase ease and independence at this time  Grooming: Moderate assistance, Sitting Grooming Details (indicate cue type and reason): perseverates on washing face, able to wash hands with min assist given multimodal cueing  Upper Body Bathing: Moderate assistance, Sitting Lower Body Bathing: Maximal assistance, Bed level Upper Body Dressing : Moderate assistance, Sitting Lower Body Dressing: Total assistance, Sitting/lateral leans Toilet Transfer  Details (indicate cue type and reason): deferred due to safety Functional mobility during ADLs: Moderate assistance(limited to bed mobility only) General ADL Comments: pt limited by cognition, R hemiparesis, impaired balance and decreased act tolerance  Cognition: Cognition Overall Cognitive Status: Impaired/Different from baseline Orientation Level: Oriented to person Cognition Arousal/Alertness: Awake/alert Behavior During Therapy: WFL for tasks assessed/performed Overall Cognitive Status: Impaired/Different from baseline Area of Impairment: Safety/judgement Orientation Level: Disoriented to, Time, Situation Current Attention Level: Sustained Memory: Decreased short-term memory Following Commands: Follows one step commands with increased time, Follows multi-step commands inconsistently Safety/Judgement: Decreased awareness of deficits Awareness: Emergent Problem Solving: Difficulty sequencing, Requires verbal cues, Requires tactile cues General Comments: pt extremely HOH which can contribute to difficulty following commands, pt improved from yesterday, R sided neglect   Blood pressure (!) 114/59, pulse 86, temperature 98.7 F (37.1 C), temperature source Oral, resp. rate 18, height 5\' 5"  (1.651 m), weight 79.8 kg, SpO2 95 %. Physical Exam  Nursing note and vitals reviewed. Constitutional: He appears well-developed and well-nourished.  Lying in high low bed with legs off the edge.   HENT:  Head: Normocephalic and atraumatic.   Eyes: Pupils are equal, round, and reactive to light. EOM are normal.  Neck: Normal range of motion.  Right neck with layering ecchymosis and hard indurated area under chin. Foam dressing with dry blood.   Cardiovascular: Normal rate and regular rhythm. Exam reveals no friction rub.  No murmur heard. Respiratory: Effort normal. No respiratory distress.  GI: Soft.  Genitourinary:    Genitourinary Comments: Condom cath in place. Incontinent of bowel.    Musculoskeletal:        General: No edema.  Neurological: He is alert.  HOH. Oriented to self and place "my 2nd home- hospital". Moves all 4, left more spontaneously than right. Inconsistent participation in exam. Distracted.   Skin:  Bruising right neck, wound,erythema right forearm  Psychiatric:  Pleasant but distracted, confused    Results for orders placed or performed during the hospital encounter of 07/14/18 (from the past 48 hour(s))  CBC     Status: Abnormal   Collection Time: 07/15/18  4:37 AM  Result Value Ref Range   WBC 7.2 4.0 - 10.5 K/uL   RBC 3.61 (L) 4.22 - 5.81 MIL/uL   Hemoglobin 11.2 (L) 13.0 - 17.0 g/dL   HCT 34.3 (L) 39.0 - 52.0 %   MCV 95.0 80.0 - 100.0 fL   MCH 31.0 26.0 - 34.0 pg   MCHC 32.7 30.0 - 36.0 g/dL   RDW 14.5 11.5 - 15.5 %   Platelets 132 (L) 150 - 400 K/uL   nRBC 0.0 0.0 - 0.2 %    Comment: Performed at [facility], [address], [place], [place] [address]  Basic metabolic panel     Status: Abnormal   Collection Time: 07/15/18  4:37 AM  Result Value Ref Range   Sodium 136 135 - 145 mmol/L   Potassium 4.0 3.5 - 5.1 mmol/L   Chloride 106 98 - 111 mmol/L   CO2 20 (L) 22 - 32 mmol/L   Glucose, Bld 106 (H) 70 - 99 mg/dL   BUN 18 8 - 23 mg/dL   Creatinine, Ser 1.61 (H) 0.61 - 1.24 mg/dL   Calcium 8.5 (L) 8.9 - 10.3 mg/dL   GFR calc non Af Amer 38 (L) >60 mL/min   GFR calc Af Amer 44 (L) >60 mL/min   Anion gap 10 5 - 15    Comment:  Performed at [facility], [place] [address] [place], [place] [address]  CBC     Status: Abnormal   Collection Time: 07/16/18  5:42 AM  Result Value Ref Range   WBC 8.6 4.0 - 10.5 K/uL   RBC 3.74 (L) 4.22 - 5.81 MIL/uL   Hemoglobin 11.8 (L) 13.0 - 17.0 g/dL   HCT 35.3 (L) 39.0 - 52.0 %   MCV 94.4 80.0 - 100.0 fL   MCH 31.6 26.0 - 34.0 pg   MCHC 33.4 30.0 - 36.0 g/dL   RDW 14.2 11.5 - 15.5 %   Platelets 129 (L) 150 - 400 K/uL   nRBC 0.0 0.0 - 0.2 %    Comment: Performed at [facility], [place] [address], [place], [place] [address]  Basic metabolic panel     Status: Abnormal   Collection Time: 07/16/18  5:42 AM  Result Value Ref Range   Sodium 136 135 - 145 mmol/L   Potassium 4.2 3.5 - 5.1 mmol/L   Chloride 102 98 - 111 mmol/L   CO2 21 (L) 22 - 32 mmol/L   Glucose, Bld 127 (H) 70 - 99 mg/dL   BUN 18 8 - 23 mg/dL   Creatinine, Ser 1.44 (H) 0.61 - 1.24 mg/dL   Calcium 9.0 8.9 - 10.3 mg/dL   GFR calc non Af Amer 43 (L) >60 mL/min   GFR calc Af Amer 50 (L) >60 mL/min   Anion gap 13 5 - 15    Comment: Performed at [facility] [address], [place], [place] [address]   Ct Head Wo Contrast  Result Date: 07/15/2018 CLINICAL DATA:  Intracranial hemorrhage follow-up. EXAM: CT HEAD WITHOUT CONTRAST TECHNIQUE: Contiguous axial images were obtained from the base of the skull through the vertex without intravenous contrast. COMPARISON:  CT 07/14/2018 FINDINGS: Brain: Left thalamic hematoma is unchanged measuring 16 x 24 mm. Mild surrounding edema. No new area of hemorrhage or infarction. Ventricle size normal. No significant midline shift. Vascular: Negative for hyperdense vessel.  Vascular calcification. Skull: Negative Sinuses/Orbits: Extensive mucoperiosteal thickening left maxillary sinus. Mild mucoperiosteal thickening right maxillary sinus. Right mastoidectomy. Chronic mastoiditis on the left. Other: None IMPRESSION: Stable hemorrhage left thalamus.  No change from yesterday. Electronically Signed   By: Franchot Gallo M.D.    On: 07/15/2018 07:07   Ct Head Wo Contrast  Result Date: 07/14/2018 CLINICAL DATA:  Follow-up acute hemorrhagic stroke/hypertensive hemorrhage. EXAM: CT HEAD WITHOUT CONTRAST 12:20 p.m.: TECHNIQUE: Contiguous axial images were obtained from the base of the skull through the vertex without intravenous contrast. COMPARISON:  CT brain earlier same day at 5:48 a.m. and previously. FINDINGS: Brain: Acute hemorrhage in the LEFT thalamus and POSTERIOR LEFT basal ganglia is unchanged since earlier this morning, still measuring approximately 1.5 x 2.2 x 1.9 cm. Minimal mass effect with no midline shift. No new intracranial hemorrhage or hematoma elsewhere. Moderate to severe cortical atrophy, moderate deep atrophy and mild to moderate chronic microvascular ischemic changes of the white matter have not significantly changed dating back to at least 2017. Vascular: Severe BILATERAL carotid siphon atherosclerosis. Moderate vertebrobasilar atherosclerosis. Skull: No skull fracture or other focal osseous abnormality involving the skull. Sinuses/Orbits: Mucosal thickening involving the LEFT maxillary sinus with marked thickening of the sinus walls, indicating chronicity. Minimal mucosal thickening involving the RIGHT maxillary sinus. Opacification a POSTERIOR LEFT ethmoid air cell. Frontal sinuses and sphenoid sinuses well aerated. Prior RIGHT mastoidectomy. Chronic LEFT mastoid effusion. Visualized orbits and  globes normal in appearance. Other: None. IMPRESSION: 1. Stable acute hemorrhage involving the LEFT thalamus and posterior LEFT basal ganglia since earlier this morning, measurements given above. 2. No new intracranial abnormality. 3. Stable moderate to severe generalized atrophy and mild to moderate chronic microvascular ischemic changes of the white matter. 4. Stable chronic LEFT maxillary sinusitis and chronic LEFT mastoid effusion. Electronically Signed   By: Evangeline Dakin M.D.   On: 07/14/2018 12:47   Dg Abd 2  Views  Result Date: 07/15/2018 CLINICAL DATA:  Abdominal pain EXAM: ABDOMEN - 2 VIEW COMPARISON:  09/21/2009 CT abdomen/pelvis FINDINGS: Cholecystectomy clips are seen in the right upper quadrant of the abdomen. No dilated small bowel loops or air-fluid levels. Mild-to-moderate colorectal stool. No evidence of pneumatosis or pneumoperitoneum. Chronic reticular opacities at both lung bases, not definitely changed since 06/01/2018 chest CT. No radiopaque nephrolithiasis. Intact visualized lower sternotomy wires. IMPRESSION: Nonobstructive bowel gas pattern. Nonspecific chronic reticular opacities at the lung bases. Electronically Signed   By: Ilona Sorrel M.D.   On: 07/15/2018 08:27   Vas [place] Lower Extremity Venous (dvt)  Result Date: 07/14/2018  Lower Venous Study Indications: Patient on therapeutic Lovenox, now off anticoagulants as patient has hemorrhage. Other Indications: History of left peroneal DVT 02/03/2018. Limitations: Poor ultrasound/tissue interface and positioning. Comparison Study: Prior study from 02/03/2018 is available for comparison. Performing Technologist: Sharion Dove RVS  Examination Guidelines: A complete evaluation includes B-mode imaging, spectral Doppler, color Doppler, and power Doppler as needed of all accessible portions of each vessel. Bilateral testing is considered an integral part of a complete examination. Limited examinations for reoccurring indications may be performed as noted.  Right Venous Findings: +---------+---------------+---------+-----------+----------+--------------+            Compressibility Phasicity Spontaneity Properties Summary         +---------+---------------+---------+-----------+----------+--------------+  CFV       Full            Yes       Yes                                    +---------+---------------+---------+-----------+----------+--------------+  SFJ       Full                                                              +---------+---------------+---------+-----------+----------+--------------+  FV Prox   Full                                                             +---------+---------------+---------+-----------+----------+--------------+  FV Mid    Full                                                             +---------+---------------+---------+-----------+----------+--------------+  FV Distal  Yes       Yes                    visualized      +---------+---------------+---------+-----------+----------+--------------+  POP                       Yes       Yes                    visualized      +---------+---------------+---------+-----------+----------+--------------+  PTV       Full                                                             +---------+---------------+---------+-----------+----------+--------------+  PERO                                                       Not visualized  +---------+---------------+---------+-----------+----------+--------------+  Right Technical Findings: Not visualized segments include profunda and peroneal veins.  Left Venous Findings: +---------+---------------+---------+-----------+----------+----------+            Compressibility Phasicity Spontaneity Properties Summary     +---------+---------------+---------+-----------+----------+----------+  CFV       Full                                                         +---------+---------------+---------+-----------+----------+----------+  SFJ       Full                                                         +---------+---------------+---------+-----------+----------+----------+  FV Prox   Full                                                         +---------+---------------+---------+-----------+----------+----------+  FV Mid    Full                                                         +---------+---------------+---------+-----------+----------+----------+  FV Distal Full                                                          +---------+---------------+---------+-----------+----------+----------+  PFV       Full                                                         +---------+---------------+---------+-----------+----------+----------+  POP                       Yes       Yes                    visualized  +---------+---------------+---------+-----------+----------+----------+  PTV       Full                                                         +---------+---------------+---------+-----------+----------+----------+  PERO      Partial                                          Chronic     +---------+---------------+---------+-----------+----------+----------+    Summary: Right: There is no evidence of deep vein thrombosis in the lower extremity. However, portions of this examination were limited- see technologist comments above. Left: Findings consistent with chronic deep vein thrombosis involving the left peroneal vein.  *See table(s) above for measurements and observations.    Preliminary        Medical Problem List and Plan: 1.  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   -admit to inpatient rehab 2.  Antithrombotics: - PE/LLE DVT/anticoagulation:  Mechanical: Sequential compression devices, below knee Bilateral lower extremities  -antiplatelet therapy: N/A 3. Pain Management: tylenol prn.  4. Mood: LCSW to follow for evaluation and support.   -antipsychotic agents:  N/A. Continue ativan tid as at home and Prozac.  5. Neuropsych: This patient is not fully capable of making decisions on his own behalf. 6. Skin/Wound Care: Monitor right neck hematoma for resolution.  7. Fluids/Electrolytes/Nutrition: Monitor I/O. Check lytes in am.  8. Chronicthrombocytopenia/ ABLA: Continue to monitor H/H/Platelets with serial checks.  Monitor for signs of bleeding. Recheck CBC in am.  9. Right forearm wound/ recent cellulitis: On Keflex--question completed course per records. Will add  Aqacell to wound as per derm records.  10.  CKD: Avoid nephrotoxic medications. Monitor with serial checks.  11. H/o A fib: Monitor HR bid. Continue metoprolol bid--off Lovenox.  12. H/o Depression: On Prozac daily with ativan tid.  9. H/o Lung cancer: Had PE on Eliquis. No antiplatelets or anticoagulation for now. Contacted neurolog for input--they will follow up with recommendations.       Bary Leriche, PA-C 07/16/2018

## 2018-07-16 NOTE — Progress Notes (Signed)
Physical Therapy Treatment Patient Details Name: Evan [name] MRN: [id number] DOB: 02/15/1928 Today's Date: 07/16/2018    History of Present Illness Pt is a 85 y/o male presenting after a fall and R sided weakness. CT revealed L thalamic ICH. PMH significant for but not limited to: adenocarcinoma of R lung, anxiety, arthritis, CAD, chronic back pain, dperession, hepatitis, pneumonia, subdural hematoma.     PT Comments    Pt with significant improvement from initial eval. Pt extremely HOH limiting pt's ability to follow commands. Pt able to sie EOB statically while maintaining midline but unable to maintain dynamically. Pt demo's impaired co-ordination/sequencing during standing and std pvt to chair with R LE. Pt unable to use R UE to grip RW to assist with transfer. Acute PT to cont to follow. Pt cont to be appropriate for CIR upon d/c for maximal functional recovery as pt was indep PTA.    Follow Up Recommendations  CIR     Equipment Recommendations  Other (comment)(TBD)    Recommendations for Other Services Rehab consult     Precautions / Restrictions Precautions Precautions: Fall Precaution Comments: bruising along R side o fneck Restrictions Weight Bearing Restrictions: No    Mobility  Bed Mobility Overal bed mobility: Needs Assistance Bed Mobility: Supine to Sit     Supine to sit: Min assist     General bed mobility comments: max directional v/c's, R LE requiring increased time to move towards R side of bed, pt used L UE to reach for bed rail and pull up, HOB slightly elevated  Transfers Overall transfer level: Needs assistance Equipment used: Rolling walker (2 wheeled);2 person hand held assist Transfers: Sit to/from [facility] Sit to Stand: Min assist;Mod assist;+2 physical assistance Stand pivot transfers: Mod assist;+2 physical assistance       General transfer comment: completed several sit/stand attempts, with and without RW. Pt initially  can power up and stand with R LE locked in extension however pt fatigues and quickly buckles/demos R knee instability, pt able to advance R LE however ataxic and difficulty placing and controlling, able to lift L LE x2 prior to R knee buckling requiring modA to block R knee. Pt requierd modAX2 for std pvt to chair as patient unable to sequence or coordinate setpping pattern. Pt attempted to stand up to RW however pt unable to grip wih R hand and with signifficant  Rlateral lean without less physical assist  Ambulation/Gait                 Stairs             Wheelchair Mobility    Modified Rankin (Stroke Patients Only) Modified Rankin (Stroke Patients Only) Pre-Morbid Rankin Score: No significant disability Modified Rankin: Severe disability     Balance Overall balance assessment: Needs assistance Sitting-balance support: No upper extremity supported;Feet supported Sitting balance-[name] Scale: Fair Sitting balance - Comments: pt able to maintain midline posture statically but with strong lean to the R with dynamic activity Postural control: Right lateral lean   Standing balance-[name] Scale: Poor Standing balance comment: dependent on physical assist                            Cognition Arousal/Alertness: Awake/alert Behavior During Therapy: WFL for tasks assessed/performed Overall Cognitive Status: Impaired/Different from baseline Area of Impairment: Safety/judgement                   Current Attention  Level: Sustained   Following Commands: Follows one step commands with increased time;Follows multi-step commands inconsistently Safety/Judgement: Decreased awareness of deficits Awareness: Emergent Problem Solving: Difficulty sequencing;Requires verbal cues;Requires tactile cues General Comments: pt extremely HOH which can contribute to difficulty following commands, pt improved from yesterday, R sided neglect      Exercises      General  Comments General comments (skin integrity, edema, etc.): VSS. SOB on 2Lo2 however SPO2 >94%      Pertinent Vitals/Pain Pain Assessment: No/denies pain    Home Living                      Prior Function            PT Goals (current goals can now be found in the care plan section) Progress towards PT goals: Progressing toward goals    Frequency    Min 4X/week      PT Plan Current plan remains appropriate    Co-evaluation PT/OT/SLP Co-Evaluation/Treatment: Yes Reason for Co-Treatment: Complexity of the patient's impairments (multi-system involvement) PT goals addressed during session: Mobility/safety with mobility        AM-PAC PT "6 Clicks" Mobility   Outcome Measure  Help needed turning from your back to your side while in a flat bed without using bedrails?: A Little Help needed moving from lying on your back to sitting on the side of a flat bed without using bedrails?: A Little Help needed moving to and from a bed to a chair (including a wheelchair)?: A Lot Help needed standing up from a chair using your arms (e.g., wheelchair or bedside chair)?: A Lot Help needed to walk in hospital room?: Total Help needed climbing 3-5 steps with a railing? : Total 6 Click Score: 12    End of Session Equipment Utilized During Treatment: Gait belt;Oxygen Activity Tolerance: Patient tolerated treatment well Patient left: in chair;with call bell/phone within reach;with chair alarm set Nurse Communication: Mobility status PT Visit Diagnosis: Unsteadiness on feet (R26.81);Other symptoms and signs involving the nervous system (R29.898);Difficulty in walking, not elsewhere classified (R26.2)     Time: 3545-6256 PT Time Calculation (min) (ACUTE ONLY): 29 min  Charges:  $Neuromuscular Re-education: 8-22 mins                     Evan [name], PT, DPT Acute Rehabilitation Services Pager #: [phone] Office #: [phone]    Berline Lopes 07/16/2018, 10:30 AM

## 2018-07-16 NOTE — Evaluation (Signed)
Occupational Therapy Assessment and Plan  Patient Details  Name: Evan [name] MRN: [id number] Date of Birth: 1927-10-03  OT Diagnosis: abnormal posture, apraxia, ataxia, cognitive deficits, hemiplegia affecting dominant side and muscle weakness (generalized) Rehab Potential: Rehab Potential (ACUTE ONLY): Good ELOS: 25-28 days   Today's Date: 07/17/2018 OT Individual Time: 1415-1500 OT Individual Time Calculation (min): 45 min  and Today's Date: 07/17/2018 OT Missed Time: 30 Minutes Missed Time Reason: Patient fatigue    Problem List:  Patient Active Problem List   Diagnosis Date Noted  . History of lung cancer   . Depression   . CKD (chronic kidney disease), stage III ([place])   . Acute blood loss anemia   . Fall 07/16/2018  . 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 Thalamic hemorrhage ([place]) 07/16/2018  . Acute spontaneous 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 ([place]) L thalamic 07/14/2018  . Cellulitis of arm, right 06/01/2018  . Rash and nonspecific skin eruption 06/01/2018  . Chest pain 02/02/2018  . Thrombocytopenia ([place]) 02/02/2018  . Hypokalemia 02/02/2018  . Pulmonary embolism ([place]) 02/02/2018  . Elevated troponin 02/02/2018  . Pulmonary embolus ([place]) 02/02/2018  . Non-ST elevation MI (NSTEMI) ([place]) 06/27/2017  . Diastolic CHF ([place]) 88/28/0034  . Chest pain due to CAD ([place]) 06/26/2017  . Dyspnea 06/21/2017  . Acute bronchitis 05/15/2017  . Oral candidiasis 05/15/2017  . Acute pericarditis   . Chest pain at rest 12/20/2016  . Angina pectoris ([place]) 12/20/2016  . Radiation pneumonitis ([place]) 11/09/2016  . Pleurisy 10/11/2016  . Chest wall pain 10/11/2016  . Atrial fibrillation ([place]) 05/21/2016  . Adenocarcinoma of right lung ([place]) 01/06/2016  . GAD (generalized anxiety disorder) 11/20/2015  . Essential hypertension   . Cellulitis 09/04/2015  . Cellulitis of left axilla   . Esophageal ulcer without bleeding   . Bilateral lower extremity  edema 04/28/2014  . BPH (benign prostatic hyperplasia) 10/08/2012  . Anxiety   . EKG abnormalities 09/05/2012  . Tremor 09/05/2012  . Chronic fatigue 09/05/2012  . Arthritis   . Asthma, chronic   . Reflux esophagitis   . S/P CABG (coronary artery bypass graft)   . GERD (gastroesophageal reflux disease)   . Hyperlipidemia   . Hypertension   . UNSPECIFIED PERIPHERAL VASCULAR DISEASE 06/16/2009    Past Medical History:  Past Medical History:  Diagnosis Date  . Adenocarcinoma of right lung ([place]) 01/06/2016  . Anginal chest pain at rest [facility])    Chronic non, controlled on Lorazepam  . Anxiety   . Arthritis    "all over"   . BPH (benign prostatic hyperplasia)   . CAD (coronary artery disease)   . Chronic lower back pain   . Complication of anesthesia    "problems making water afterwards"  . Depression   . DJD (degenerative joint disease)   . Esophageal ulcer without bleeding    bid ppi indefinitely  . Family history of adverse reaction to anesthesia    "children w/PONV"  . GERD (gastroesophageal reflux disease)   . Headache    "couple/week maybe" (09/04/2015)  . Hemochromatosis    Possible, elevated iron stores, hook-like osteophytes on hand films, normal LFTs  . Hepatitis    "yellow jaundice as a baby"  . History of ASCVD    MULTIVESSEL  . Hyperlipidemia   . Hypertension   . Myocardial infarction ([place]) 1999  . Osteoarthritis   . Pneumonia    "several times; got a little now" (09/04/2015)  . Rheumatoid arthritis ([place])   .  Subdural hematoma ([facility])    small after fall 09/2016-plavix held, neurosurgery consulted.  Asymptomatic.    Marland Kitchen 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[place]) 02/2018   on Lovenox lifelong since he failed PO Eliquis   Past Surgical History:  Past Surgical History:  Procedure Laterality Date  . ARM SKIN LESION BIOPSY / EXCISION Left 09/02/2015  . CATARACT EXTRACTION W/ INTRAOCULAR LENS  IMPLANT, BILATERAL Bilateral   . CHOLECYSTECTOMY OPEN    . CORNEAL TRANSPLANT Bilateral     "one at [facility]; one at [facility]"  . CORONARY ANGIOPLASTY WITH STENT PLACEMENT    . CORONARY ARTERY BYPASS GRAFT  1999  . DESCENDING AORTIC ANEURYSM REPAIR W/ STENT     DE stent ostium into the right radial free graft at OM-, 02-2007  . ESOPHAGOGASTRODUODENOSCOPY N/A 11/09/2014   Procedure: ESOPHAGOGASTRODUODENOSCOPY (EGD);  Surgeon: [name], MD;  Location: [facility] ENDOSCOPY;  Service: Endoscopy;  Laterality: N/A;  . INGUINAL HERNIA REPAIR    . JOINT REPLACEMENT    . LEFT HEART CATH AND CORS/GRAFTS ANGIOGRAPHY N/A 06/27/2017   Procedure: LEFT HEART CATH AND CORS/GRAFTS ANGIOGRAPHY;  Surgeon: [name], MD;  Location: [facility] CV LAB;  Service: Cardiovascular;  Laterality: N/A;  . NASAL SINUS SURGERY    . SHOULDER OPEN ROTATOR CUFF REPAIR Right   . TOTAL KNEE ARTHROPLASTY Bilateral     Assessment & Plan Clinical Impression: Patient is a 85 y.o. year old male presenting after a fall and R sided weakness. CT revealed L thalamic ICH. PMH significant for but not limited to: adenocarcinoma of R lung, anxiety, arthritis, CAD, chronic back pain, dperession, hepatitis, pneumonia, subdural hematoma.  Patient transferred to CIR on 07/16/2018 .    Patient currently requires total with basic self-care skills secondary to muscle weakness, decreased cardiorespiratoy endurance, impaired timing and sequencing, unbalanced muscle activation, motor apraxia, ataxia, decreased coordination and decreased motor planning, decreased midline orientation, decreased attention to right, right side neglect and decreased motor planning, decreased initiation, decreased attention, decreased awareness, decreased problem solving, decreased safety awareness, decreased memory and delayed processing and decreased sitting balance, decreased standing balance, decreased postural control, hemiplegia and decreased balance strategies.  Prior to hospitalization, patient could complete BADL with independent .  Patient will benefit from skilled  intervention to increase independence with basic self-care skills prior to discharge home with care partner.  Anticipate patient will require 24 hour supervision and minimal physical assistance and follow up home health.  OT - End of Session Endurance Deficit: Yes Endurance Deficit Description: rest breaks within BADL tasks, fatigues very fast OT Assessment Rehab Potential (ACUTE ONLY): Good OT Patient demonstrates impairments in the following area(s): Balance;Cognition;Endurance;Motor;Perception;Safety;Vision;Sensory OT Basic ADL's Functional Problem(s): Eating;Bathing;Grooming;Dressing;Toileting OT Transfers Functional Problem(s): Toilet;Tub/Shower OT Additional Impairment(s): Fuctional Use of Upper Extremity OT Plan OT Intensity: Minimum of 1-2 x/day, 45 to 90 minutes OT Frequency: 5 out of 7 days OT Duration/Estimated Length of Stay: 25-28 days OT Treatment/Interventions: Balance/vestibular training;Cognitive remediation/compensation;Community reintegration;Discharge planning;Disease mangement/prevention;DME/adaptive equipment instruction;Functional electrical stimulation;Functional mobility training;Neuromuscular re-education;Patient/family education;Self Care/advanced ADL retraining;Therapeutic Activities;Therapeutic Exercise;UE/LE Strength taining/ROM;UE/LE Coordination activities;Wheelchair propulsion/positioning;Visual/perceptual remediation/compensation OT Self Feeding Anticipated Outcome(s): Supervision OT Basic Self-Care Anticipated Outcome(s): Min A OT Toileting Anticipated Outcome(s): Min A OT Bathroom Transfers Anticipated Outcome(s): Min A OT Recommendation Patient destination: Home Follow Up Recommendations: Home health OT Equipment Recommended: To be determined   Skilled Therapeutic Intervention OT eval completed addressing rehab process, OT purpose, POC, ELOS, and goals. Pt greeted semi-reclined in bed and agreeable to participate in OT. Pt extremely hard of hearing and  required gestures throughout session to help communicate. Pt came to sitting EOB with mod A. Pt needed minimal assistance initially to maintain sitting balance, then progressed to max A. Pt unable to assist with UB bathing/dressing while trying to maintain balance requring max A for these tasks. Pt also weezing and breathing heavy while seated EOB. SpO2 95%. Pt only able to tolerate sitting for 3 mins then had to return to supine. Pt noted to have been incontinent of bladder in the bed. Pt completed rolling L and R with mod A and manual facilitation from OT to reach with L UE and grasp rail to roll. Total A for peri-care and brief change. Finished LB bathing in the bed with total A. Pt unable to participate further in treatment session, so pt left semi-reclined in bed with needs met and call bell in reach.   OT Evaluation Precautions/Restrictions  Precautions Precautions: Fall Restrictions Weight Bearing Restrictions: No General OT Amount of Missed Time: 30 Minutes Vital Signs Therapy Vitals Temp: 98.1 F (36.7 C) Pulse Rate: 77 Resp: 18 BP: (!) 123/58 Patient Position (if appropriate): Lying Oxygen Therapy SpO2: 98 % O2 Device: Room Air Pain Pain Assessment Pain Scale: 0-10 Pain Score: 0-No pain Home Living/Prior Functioning Home Living Family/patient expects to be discharged to:: Private residence Living Arrangements: Alone Available Help at Discharge: Family, Available 24 hours/day Type of Home: House Home Access: Stairs to enter [employer] of Steps: 4; also has ramp which was for his wife/deceased Home Layout: Two level, Laundry or work area in basement, Able to live on main level with bedroom/bathroom Alternate Level Stairs-Rails: (to basement) Bathroom Shower/Tub: Multimedia programmer: Handicapped height Bathroom Accessibility: Yes Additional Comments:  Per chart review: daughter confirms home set up . 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) Comments: Per chart review pt was driving and independent w/ ADLs PTA ADL ADL Eating: Minimal assistance Grooming: Maximal assistance Upper Body Bathing: Dependent Lower Body Bathing: Dependent Upper Body Dressing: Maximal assistance Lower Body Dressing: Dependent Toileting: Dependent Toilet Transfer: Unable to assess Tub/Shower Transfer: Unable to assess Vision Baseline Vision/History: Wears glasses Wears Glasses: At all times Vision Assessment?: Vision impaired- to be further tested in functional context Perception  Perception: Impaired Inattention/Neglect: Does not attend to right side of body Praxis Praxis: Impaired Praxis Impairment Details: Initiation;Motor planning Praxis-Other Comments: mild motor planning/initiation impairments observed Cognition Overall Cognitive Status: Impaired/Different from baseline Arousal/Alertness: Awake/alert Orientation Level: Person;Place;Situation Person: Oriented Place: Oriented Situation: Disoriented Year: Other (Comment)(2008) Month: March Day of Week: Incorrect Memory: Impaired Memory Impairment: Storage deficit;Retrieval deficit;Decreased recall of new information Immediate Memory Recall: Sock;Blue;Bed Memory Recall: Bed Memory Recall Bed: Without Cue Attention: Focused;Sustained;Selective Focused Attention: Appears intact Sustained Attention: Impaired Sustained Attention Impairment: Verbal basic;Functional basic Selective Attention: Impaired Selective Attention Impairment: Verbal basic;Functional basic Awareness: Impaired Awareness Impairment: Intellectual impairment;Emergent impairment;Anticipatory impairment Problem Solving:  Impaired Problem Solving Impairment: Verbal basic;Functional basic Executive Function: Sequencing;Reasoning;Organizing Reasoning: Impaired Reasoning Impairment: Verbal basic Sequencing: Impaired Sequencing Impairment: Verbal basic Organizing: Impaired Organizing Impairment: Verbal basic Safety/Judgment: Impaired Comments: decreased awareness of deficits Sensation Sensation Light Touch: Impaired Detail(absent R side) Light Touch Impaired Details: Absent RLE;Absent RUE Coordination Gross Motor Movements are Fluid and Coordinated: No Fine Motor Movements are Fluid and Coordinated: No  Coordination and Movement Description: R side decreased smoothness and accuracy Finger Nose Finger Test: R dysmetria Motor  Motor Motor: Hemiplegia;Ataxia Motor - Skilled Clinical Observations: R hemiplegia Mobility  Bed Mobility Rolling Right: Moderate Assistance - Patient 50-74% Rolling Left: Moderate Assistance - Patient 50-74% Supine to Sit: Moderate Assistance - Patient 50-74% Sit to Supine: Moderate Assistance - Patient 50-74%   Balance Balance Balance Assessed: Yes Static Sitting Balance Static Sitting - Balance Support: No upper extremity supported;Feet supported Static Sitting - Level of Assistance: 3: Mod assist Dynamic Sitting Balance Dynamic Sitting - Balance Support: No upper extremity supported;Feet supported Dynamic Sitting - Level of Assistance: 2: Max assist Static Standing Balance Static Standing - Level of Assistance: Not tested (comment)(UTA 2/2 pt too fatigued to stand) Dynamic Standing Balance Dynamic Standing - Level of Assistance: Not tested (comment) Dynamic Standing - Comments: UTA 2/2 pt too fatigued to stand Extremity/Trunk Assessment RUE Assessment RUE Assessment: Exceptions to [facility] Active Range of Motion (AROM) Comments: Shoulder abduction 3+/5, elbow flexion/extension 3/5, handgrip 2/5 RUE Body System: Neuro Brunstrum levels for arm and hand:  Arm;Hand Brunstrum level for arm: Stage V Relative Independence from Synergy Brunstrum level for hand: Stage V Independence from basic synergies LUE Assessment LUE Assessment: Within Functional Limits     Refer to Care Plan for Long Term Goals  Recommendations for other services: None    Discharge Criteria: Patient will be discharged from OT if patient refuses treatment 3 consecutive times without medical reason, if treatment goals not met, if there is a change in medical status, if patient makes no progress towards goals or if patient is discharged from hospital.  The above assessment, treatment plan, treatment alternatives and goals were discussed and mutually agreed upon: by patient  Valma Cava 07/17/2018, 3:42 PM

## 2018-07-16 NOTE — PMR Pre-admission (Signed)
PMR Admission Coordinator Pre-Admission Assessment  Patient: Evan [name] is an 85 y.o., male MRN: [id number] DOB: 1927/07/05 Height: 5' 5"  (165.1 cm) Weight: 79.8 kg  Insurance Information HMO: yes    PPO:      PCP:      IPA:      80/20:      OTHER: medicare advantage plan PRIMARY: [employer]      Policy#: [id number]      Subscriber: pt CM Name: Jeanette Caprice Phone#:[phone]     Fax#: [phone] Pre-Cert#: [id number]   Updates due 4/20 to same   Employer: retired Benefits:  Phone #: [phone]     Name: 4/13 Eff. Date: 04/04/2018    Deduct: none      Out of Pocket Max: $3400      Life Max: none CIR: $295 co pay per day days 1 until 6      SNF: no co pay days 1 until 20; $178 co pay y per day days 21 until 100 Outpatient: $10 to $40 co pay per visit     Co-Pay: visits per medical neccesity Home Health: 100%      Co-Pay: visits per medical neccesity DME: 80%     Co-Pay: 20% Providers: in network  SECONDARY: none       Medicaid Application Date:       Case Manager:  Disability Application Date:       Case Worker:   The "Data Collection Information Summary" for patients in Inpatient Rehabilitation Facilities with attached "Privacy Act [place] Records" was provided and verbally reviewed with: Patient and Family  Emergency Contact Information Contact Information    Name Relation Home Work Mobile   Andreas Blower Daughter [phone]  [phone]   [name], [name] [phone]     Jervis, Trapani [phone]        Current Medical History  Patient Admitting Diagnosis: left thalamic hemorrhage  History of Present Illness: 85 year old male with past medical history of adenocarcinoma of the lung in remission, HTN hemochromatosis, CAD with stent atrial fib, PE, on therapeutic Lovenox ( failed Eliquis)with twice daily dosing, presented to the ER on 07/14/2018 when family found him down after fallen from bed. Having difficulty moving the right side.  CT  head with Acute small ICH at the left thalamus, likely hypertensive in the setting of therapeutic lovenox. Resultant hemiparesis and ataxia. 2D echo 04/2018 with EF 60 to 65%, UDS positive for benzos ( on home ativan). VTE prophylaxis SCDs. Holding lovenox. No need of IVC filter as no acute DVT per Neuro.LE venous doppler with chronic DVT at left peroneal vein.  Facial contusion due to fall, C collar cleared by CCM and CT cervical spine with no acute injury.  CT maxillofacial with soft tissue contusion superimposed 3.4 cm hematoma at the right anterior face/neck. No fracture.  Resumed home BP meds of metoprolol, lasix. On Crestor for lipid control .  History of fall with SDH 09/2016.    Complete NIHSS TOTAL: 7  Patient's [facility] from [facility]  has been reviewed by the rehabilitation admission coordinator and physician.  Past Medical History  Past Medical History:  Diagnosis Date  . Adenocarcinoma of right lung ([place]) 01/06/2016  . Anginal chest pain at rest [facility])    Chronic non, controlled on Lorazepam  . Anxiety   . Arthritis    "all over"   . BPH (benign prostatic hyperplasia)   . CAD (coronary artery disease)   .  Chronic lower back pain   . Complication of anesthesia    "problems making water afterwards"  . Depression   . DJD (degenerative joint disease)   . Esophageal ulcer without bleeding    bid ppi indefinitely  . Family history of adverse reaction to anesthesia    "children w/PONV"  . GERD (gastroesophageal reflux disease)   . Headache    "couple/week maybe" (09/04/2015)  . Hemochromatosis    Possible, elevated iron stores, hook-like osteophytes on hand films, normal LFTs  . Hepatitis    "yellow jaundice as a baby"  . History of ASCVD    MULTIVESSEL  . Hyperlipidemia   . Hypertension   . Myocardial infarction ([place]) 1999  . Osteoarthritis   . Pneumonia    "several times; got a little now" (09/04/2015)  . Rheumatoid arthritis ([place])   . Subdural hematoma  (H[facility])    small after fall 09/2016-plavix held, neurosurgery consulted.  Asymptomatic.    Marland Kitchen 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[place]) 02/2018   on Lovenox lifelong since he failed PO Eliquis    Family History   family history includes Arthritis-Osteo in his sister; Heart attack in his brother and another family member.  Prior Rehab/Hospitalizations Has the patient had prior rehab or hospitalizations prior to admission? Yes  Has the patient had major surgery during 100 days prior to admission? No   Current Medications  Current Facility-Administered Medications:  .  acetaminophen (TYLENOL) tablet 650 mg, 650 mg, Oral, Q4H PRN **OR** [DISCONTINUED] acetaminophen (TYLENOL) solution 650 mg, 650 mg, Per Tube, Q4H PRN **OR** [DISCONTINUED] acetaminophen (TYLENOL) suppository 650 mg, 650 mg, Rectal, Q4H PRN, Amie Portland, MD .  cephALEXin (KEFLEX) capsule 500 mg, 500 mg, Oral, Q8H, Rosalin Hawking, MD, 500 mg at 07/16/18 0601 .  FLUoxetine (PROZAC) capsule 20 mg, 20 mg, Oral, Daily, Rosalin Hawking, MD, 20 mg at 07/16/18 0905 .  furosemide (LASIX) tablet 20 mg, 20 mg, Oral, Daily, Rosalin Hawking, MD, 20 mg at 07/16/18 0905 .  ipratropium-albuterol (DUONEB) 0.5-2.5 (3) MG/3ML nebulizer solution 3 mL, 3 mL, Nebulization, Q6H PRN, Byrum, Rose Fillers, MD .  labetalol (NORMODYNE,TRANDATE) injection 5-10 mg, 5-10 mg, Intravenous, Q2H PRN, Rosalin Hawking, MD .  LORazepam (ATIVAN) tablet 0.5 mg, 0.5 mg, Oral, TID, Rosalin Hawking, MD, 0.5 mg at 07/16/18 0905 .  magnesium oxide (MAG-OX) tablet 400 mg, 400 mg, Oral, BID, Rosalin Hawking, MD, 400 mg at 07/16/18 0905 .  metoprolol tartrate (LOPRESSOR) tablet 12.5 mg, 12.5 mg, Oral, BID, Rosalin Hawking, MD, 12.5 mg at 07/16/18 0904 .  pantoprazole (PROTONIX) EC tablet 40 mg, 40 mg, Oral, Gennette Pac, MD, 40 mg at 07/16/18 0905 .  rosuvastatin (CRESTOR) tablet 5 mg, 5 mg, Oral, q1800, Rosalin Hawking, MD, 5 mg at 07/15/18 1730 .  senna-docusate (Senokot-S) tablet 1 tablet, 1 tablet, Oral, BID, Amie Portland, MD, 1 tablet at 07/16/18 0905 .  tamsulosin (FLOMAX) capsule 0.4 mg, 0.4 mg, Oral, Daily, Rosalin Hawking, MD, 0.4 mg at 07/16/18 6599  Patients Current Diet:  Diet Order            DIET DYS 2 Room service appropriate? No; Fluid consistency: Thin  Diet effective now              Precautions / Restrictions Precautions Precautions: Fall Precaution Comments: bruising along R side of neck Restrictions Weight Bearing Restrictions: No   Has the patient had 2 or more falls or a fall with injury in the past year? No  Prior Activity Level Community (5-7x/wk): Independnet  without AD, drove, did own adls  Prior Functional Level Self Care: Did the patient need help bathing, dressing, using the toilet or eating? Independent  Indoor Mobility: Did the patient need assistance with walking from room to room (with or without device)? Independent  Stairs: Did the patient need assistance with internal or external stairs (with or without device)? Independent  Functional Cognition: Did the patient need help planning regular tasks such as shopping or remembering to take medications? Needed some help  Home Assistive Devices / Equipment Home Equipment: Walker - 2 wheels, Shower seat, Kasandra Knudsen - single point  Prior Device Use: Indicate devices/aids used by the patient prior to current illness, exacerbation or injury? None of the above  Current Functional Level Cognition  Arousal/Alertness: Awake/alert Overall Cognitive Status: Impaired/Different from baseline Current Attention Level: Sustained Orientation Level: Oriented to person, Disoriented to place, Disoriented to time, Disoriented to situation Following Commands: Follows one step commands with increased time, Follows multi-step commands inconsistently Safety/Judgement: Decreased awareness of deficits General Comments: pt extremely HOH which can contribute to difficulty following commands, pt improved from yesterday, R sided neglect  Attention: Sustained Sustained Attention: Impaired Sustained Attention Impairment: Verbal basic, Functional basic Memory: Impaired Memory Impairment: Storage deficit, Retrieval deficit, Decreased recall of new information    Extremity Assessment (includes Sensation/Coordination)  Upper Extremity Assessment: RUE deficits/detail RUE Deficits / Details: shoulder elevation 3+/5, shoulder flexion 2-/5, shoulder abduction 3-/5, elbow flexion/extension 3-/5, hand flexion/extension 3-/5; dysmetric  RUE Sensation: decreased light touch, decreased proprioception RUE Coordination: decreased fine motor, decreased gross motor  Lower Extremity Assessment: RLE deficits/detail RLE Deficits / Details: At least anti gravity (3/5) strength, weak ankle dorsiflexion and eversion RLE Sensation: decreased light touch RLE Coordination: decreased gross motor    ADLs  Overall ADL's : Needs assistance/impaired Eating/Feeding: Bed level, Minimal assistance Eating/Feeding Details (indicate cue type and reason): min assist to setup using L UE, cueing to use L UE to increase ease and independence at this time  Grooming: Oral care, Wash/dry face, Minimal assistance, Moderate assistance, Sitting Grooming Details (indicate cue type and reason): hand over hand assist to incorporate RUE into task - pt noted to have food pocketed in mouth during completion of oral care, also requires questioning cues to sequence through task as pt attempting to brush teeth without applying toothpaste; requires increased assist with dynamic sitting balance EOB Upper Body Bathing: Moderate assistance, Sitting Lower Body Bathing: Maximal assistance, Bed level Upper Body Dressing : Moderate assistance, Sitting Lower Body Dressing: Total assistance, Sitting/lateral leans Toilet Transfer Details (indicate cue type and reason): deferred due to safety Functional mobility during ADLs: Moderate assistance, +2 for physical assistance, +2 for  safety/equipment(stand pivot transfers) General ADL Comments: pt limited by cognition, R hemiparesis, impaired balance and decreased act tolerance    Mobility  Overal bed mobility: Needs Assistance Bed Mobility: Supine to Sit Supine to sit: Min assist Sit to supine: Mod assist General bed mobility comments: max directional v/c's, R LE requiring increased time to move towards R side of bed, pt used L UE to reach for bed rail and pull up, HOB slightly elevated    Transfers  Overall transfer level: Needs assistance Equipment used: Rolling walker (2 wheeled), 2 person hand held assist Transfers: Sit to/from Stand, Stand Pivot Transfers Sit to Stand: Min assist, Mod assist, +2 physical assistance Stand pivot transfers: Mod assist, +2 physical assistance General transfer comment: completed several sit/stand attempts, with and without RW. Pt initially can power up and stand with R  LE locked in extension however pt fatigues and quickly buckles/demos R knee instability, pt able to advance R LE however ataxic and difficulty placing and controlling, able to lift L LE x2 prior to R knee buckling requiring modA to block R knee. Pt requierd modAX2 for std pvt to chair as patient unable to sequence or coordinate setpping pattern. Pt attempted to stand up to RW however pt unable to grip wih R hand and with signifficant  Rlateral lean without less physical assist    Ambulation / Gait / Stairs / Wheelchair Mobility       Posture / Balance Dynamic Sitting Balance Sitting balance - Comments: pt able to maintain midline posture statically but with strong lean to the R with dynamic activity Balance Overall balance assessment: Needs assistance Sitting-balance support: No upper extremity supported, Feet supported Sitting balance-[name] Scale: Fair Sitting balance - Comments: pt able to maintain midline posture statically but with strong lean to the R with dynamic activity Postural control: Right lateral lean  Standing balance-[name] Scale: Poor Standing balance comment: dependent on physical assist    Special needs/care consideration BiPAP/CPAP  N/a CPM  N/a Continuous Drip IV  N/a Dialysis  N/a Life Vest  N/a Oxygen  N/a Special Bed  N/a Trach Size  N/a Wound Vac n/a Skin   Meryle Ready, RN  Registered Nurse  WOC  Consult Note  Signed  Date of Service:  07/16/2018 9:14 AM          Signed         Show:Clear all [x] Manual[x] Template[] Copied  Added by: [x] Meryle Ready, RN  [] Hover for details [place] Nurse wound consult note Patient receiving [facility] in [facility] 3W10. Reason for Consult: Right forearm wound Wound type: healing skin tear?  Patient unable to contribute to how wound formed Measurement: 3.3 cm x 2.3 cm x no measureable depth Wound bed: 100% pink, clean Drainage (amount, consistency, odor) yellow on existing foam dressing. Periwound: intact Dressing procedure/placement/frequency: Cleanse area with soap and water. Pat dry. Apply a foam dressing.  Change every 3 days. Monitor the wound area(s) for worsening of condition such as: Signs/symptoms of infection,  Increase in size,  Development of or worsening of odor, Development of pain, or increased pain at the affected locations.  Notify the medical team if any of these develop.  Thank you for the consult.  Discussed plan of care with the patient and bedside nurse.  [place] nurse will not follow at this time.  Please re-consult the [place] team if needed.  Val Riles, RN, MSN, CWOCN, CNS-BC, pager [phone]         Abrasion and ecchymosis to right jaw and neck extensive; MASD buttocks  Bowel mgmt: no LBM documented Bladder mgmt: incontinent; external catheter Diabetic mgmt: n/a Behavioral consideration n/a Chemo/radiation  N/a VERY HOH has bilateral hearing aids that he will need assistance in changing batteries and the care of    Previous Home Environment  Living Arrangements: (lives with daughter, [name])  Lives With: Daughter Available Help at Discharge: Family, Available 24 hours/day Type of Home: House Home Layout: Two level, Laundry or work area in basement, Able to live on main level with bedroom/bathroom Alternate Level Stairs-Rails: (to basement) Home Access: Stairs to enter [employer] of Steps: 4; also has ramp which was for his wife/deceased Bathroom Shower/Tub: Multimedia programmer: Handicapped height Bathroom Accessibility: Yes How Accessible: Accessible via [place]: Yes Type of [place]  Care Agency (if known): [facility] Additional Comments: daughter confirms home set up . one level with laundry in the basement  Discharge Living Setting Plans for Discharge Living Setting: Lives with (comment)(daughter, [place]) Type of Home at Discharge: House Discharge Home Layout: Two level, Laundry or work area in basement, [employer] on main level, Able to live on main level with bedroom/bathroom Alternate Level Stairs-Number of Steps: flight down to basement Discharge Home Access: Stairs to enter [employer] of Steps: 4; also has ramp which was for his wife/deceased Discharge Bathroom Shower/Tub: Horticulturist, commercial: Standard Discharge Bathroom Accessibility: Yes How Accessible: Accessible via walker  Social/Family/Support Systems Patient Roles: Parent Contact Information: daughter, [name] main contact Anticipated Caregiver: two daughters and 3 sons Anticipated Caregiver's Contact Information: [name], daughter Ability/Limitations of Caregiver: no limitations Caregiver Availability: 24/7 Discharge Plan Discussed with Primary Caregiver: Yes Is Caregiver In Agreement with Plan?: Yes Does Caregiver/Family have Issues with Lodging/Transportation while Pt is in Rehab?: No  Goals/Additional Needs Patient/Family Goal for Rehab: supervision to min PR and  OT, supervision SLP Expected length of stay: ELOS 10 to 14 days Equipment Needs: Very HOH; has two hearing aids but questionable if working Pt/Family Agrees to Admission and willing to participate: Yes Program Orientation Provided & Reviewed with Pt/Caregiver Including Roles  & Responsibilities: Yes  Decrease burden of Care through IP rehab admission: n/a  Possible need for SNF placement upon discharge:  Not anticipated  Patient Condition: I have reviewed medical records from [facility], spoken with patient, and daughter. I met with patient at the bedside for inpatient rehabilitation assessment.  Patient will benefit from ongoing PT, OT and SLP, can actively participate in 3 hours of therapy a day 5 days of the week, and can make measurable gains during the admission.  Patient will also benefit from the coordinated team approach during an Inpatient Acute Rehabilitation admission.  The patient will receive intensive therapy as well as Rehabilitation physician, nursing, social worker, and care management interventions.  Due to bladder management, bowel management, safety, skin/wound care, disease management, medication administration and patient education the patient requires 24 hour a day rehabilitation nursing.  The patient is currently mod assist with mobility and basic ADLs.  Discharge setting and therapy post discharge at home with home health is anticipated.  Patient has agreed to participate in the Acute Inpatient Rehabilitation Program and will admit today.  Preadmission Screen Completed By:  Cleatrice Burke, 07/16/2018 1:23 PM ______________________________________________________________________   Discussed status with Dr. Naaman Plummer on  07/16/2018 at 1323 and received approval for admission today.  Admission Coordinator:  Cleatrice Burke, RN MSN time 1323 Date 07/16/2018   Assessment/Plan: Diagnosis: left thalamic hemorrhage 1. Does the need for close, 24 hr/day Medical  supervision in concert with the patient's rehab needs make it unreasonable for this patient to be served in a less intensive setting? Yes 2. Co-Morbidities requiring supervision/potential complications: lung ca, HTN, CAD, afib 3. Due to bladder management, bowel management, safety, skin/wound care, disease management, medication administration, pain management and patient education, does the patient require 24 hr/day rehab nursing? Yes 4. Does the patient require coordinated care of a physician, rehab nurse, PT (1-2 hrs/day, 5 days/week), OT (1-2 hrs/day, 5 days/week) and SLP (1-2 hrs/day, 5 days/week) to address physical and functional deficits in the context of the above medical diagnosis(es)? Yes Addressing deficits in the following areas: balance, endurance, locomotion, strength, transferring, bowel/bladder control, bathing, dressing,  feeding, grooming, toileting, cognition, speech and psychosocial support 5. Can the patient actively participate in an intensive therapy program of at least 3 hrs of therapy 5 days a week? Yes 6. The potential for patient to make measurable gains while on inpatient rehab is excellent 7. Anticipated functional outcomes upon discharge from inpatients are: supervision and min assist PT, supervision and min assist OT, supervision SLP 8. Estimated rehab length of stay to reach the above functional goals is: 11-15 days 9. Anticipated D/C setting: Home 10. Anticipated post D/C treatments: [place] therapy 11. Overall Rehab/Functional Prognosis: excellent  MD Signature: Meredith Staggers, MD, [facility] 07/16/2018

## 2018-07-16 NOTE — Progress Notes (Addendum)
Evan [name], Evan [name] ([id number]) Visit Report for 07/13/2018 Arrival Information Details Patient Name: Evan, [name] Date of Service: 07/13/2018 8:45 AM Medical Record Number: [id number] Patient Account Number: [id number] Date of Birth/Sex: 05/13/1927 (85 y.o. M) Treating RN: Montey Hora Primary Care Eithel Ryall: [name] Other Clinician: Referring Cortland Crehan: [name] Treating Mikki Ziff/Extender: Melburn Hake, HOYT Weeks in Treatment: 3 Visit Information History Since Last Visit Added or deleted any medications: Yes Patient Arrived: Ambulatory Any new allergies or adverse reactions: No Arrival Time: 08:48 Had a fall or experienced change in No Accompanied By: daughter activities of daily living that may affect Transfer Assistance: None risk of falls: Patient Identification Verified: Yes Signs or symptoms of abuse/neglect since last visito No Secondary Verification Process Completed: Yes Hospitalized since last visit: No Implantable device outside of the clinic excluding No cellular tissue based products placed in the center since last visit: Has Dressing in Place as Prescribed: Yes Pain Present Now: No Electronic Signature(s) Signed: 07/13/2018 12:06:33 PM By: Lorine Bears RCP, RRT, CHT Entered By: Lorine Bears on 07/13/2018 08:50:11 Evan [name] ([id number]) -------------------------------------------------------------------------------- Clinic Level of Care Assessment Details Patient Name: Evan [name] Date of Service: 07/13/2018 8:45 AM Medical Record Number: [id number] Patient Account Number: [id number] Date of Birth/Sex: [birth year] (85 y.o. M) Treating RN: Montey Hora Primary Care Riverlyn Kizziah: [name] Other Clinician: Referring Gracie Gupta: [name] Treating Tamon Parkerson/Extender: Melburn Hake, HOYT Weeks in Treatment: 3 Clinic Level of Care Assessment Items TOOL 4 Quantity Score []  - Use when only an EandM is performed on  FOLLOW-UP visit 0 ASSESSMENTS - Nursing Assessment / Reassessment X - Reassessment of Co-morbidities (includes updates in patient status) 1 10 X- 1 5 Reassessment of Adherence to Treatment Plan ASSESSMENTS - Wound and Skin Assessment / Reassessment X - Simple Wound Assessment / Reassessment - one wound 1 5 []  - 0 Complex Wound Assessment / Reassessment - multiple wounds []  - 0 Dermatologic / Skin Assessment (not related to wound area) ASSESSMENTS - Focused Assessment []  - Circumferential Edema Measurements - multi extremities 0 []  - 0 Nutritional Assessment / Counseling / Intervention []  - 0 Lower Extremity Assessment (monofilament, tuning fork, pulses) []  - 0 Peripheral Arterial Disease Assessment (using hand held doppler) ASSESSMENTS - Ostomy and/or Continence Assessment and Care []  - Incontinence Assessment and Management 0 []  - 0 Ostomy Care Assessment and Management (repouching, etc.) PROCESS - Coordination of Care X - Simple Patient / Family Education for ongoing care 1 15 []  - 0 Complex (extensive) Patient / Family Education for ongoing care X- 1 10 Staff obtains Programmer, systems, Records, Test Results / Process Orders []  - 0 Staff telephones HHA, Nursing Homes / Clarify orders / etc []  - 0 Routine Transfer to another Facility (non-emergent condition) []  - 0 Routine Hospital Admission (non-emergent condition) []  - 0 New Admissions / Biomedical engineer / Ordering NPWT, Apligraf, etc. []  - 0 Emergency Hospital Admission (emergent condition) X- 1 10 Simple Discharge Coordination Evan [name], Evan [name] ([id number]) []  - 0 Complex (extensive) Discharge Coordination PROCESS - Special Needs []  - Pediatric / Minor Patient Management 0 []  - 0 Isolation Patient Management []  - 0 Hearing / Language / Visual special needs []  - 0 Assessment of Community assistance (transportation, D/C planning, etc.) []  - 0 Additional assistance / Altered mentation []  - 0 Support Surface(s)  Assessment (bed, cushion, seat, etc.) INTERVENTIONS - Wound Cleansing / Measurement X - Simple Wound Cleansing - one wound 1 5 []  - 0 Complex Wound Cleansing - multiple wounds  X- 1 5 Wound Imaging (photographs - any number of wounds) []  - 0 Wound Tracing (instead of photographs) X- 1 5 Simple Wound Measurement - one wound []  - 0 Complex Wound Measurement - multiple wounds INTERVENTIONS - Wound Dressings X - Small Wound Dressing one or multiple wounds 1 10 []  - 0 Medium Wound Dressing one or multiple wounds []  - 0 Large Wound Dressing one or multiple wounds []  - 0 Application of Medications - topical []  - 0 Application of Medications - injection INTERVENTIONS - Miscellaneous []  - External ear exam 0 []  - 0 Specimen Collection (cultures, biopsies, blood, body fluids, etc.) []  - 0 Specimen(s) / Culture(s) sent or taken to Lab for analysis []  - 0 Patient Transfer (multiple staff / Civil Service fast streamer / Similar devices) []  - 0 Simple Staple / Suture removal (25 or less) []  - 0 Complex Staple / Suture removal (26 or more) []  - 0 Hypo / Hyperglycemic Management (close monitor of Blood Glucose) []  - 0 Ankle / Brachial Index (ABI) - do not check if billed separately X- 1 5 Vital Signs Evan [name], Evan [name] ([id number]) Has the patient been seen at the hospital within the last three years: Yes Total Score: 85 Level Of Care: New/Established - Level 3 Electronic Signature(s) Signed: 07/13/2018 2:25:55 PM By: Montey Hora Entered By: Montey Hora on 07/13/2018 09:10:03 Evan [name] ([id number]) -------------------------------------------------------------------------------- Encounter Discharge Information Details Patient Name: Evan [name] Date of Service: 07/13/2018 8:45 AM Medical Record Number: [id number] Patient Account Number: [id number] Date of Birth/Sex: [birth year] (85 y.o. M) Treating RN: Cornell Barman Primary Care Abhimanyu Cruces: [name] Other Clinician: Referring Mahmood Boehringer:  [name] Treating Reeve Turnley/Extender: Sharalyn Ink in Treatment: 3 Encounter Discharge Information Items Discharge Condition: Stable Ambulatory Status: Ambulatory Discharge Destination: Home Transportation: Private Auto Accompanied By: daughter Schedule Follow-up Appointment: Yes Clinical Summary of Care: Electronic Signature(s) Signed: 07/16/2018 2:22:43 PM By: Gretta Cool, BSN, RN, CWS, Kim RN, BSN Entered By: Gretta Cool, BSN, RN, CWS, Kim on 07/13/2018 09:15:09 Evan [name] ([id number]) -------------------------------------------------------------------------------- Lower Extremity Assessment Details Patient Name: Evan [name] Date of Service: 07/13/2018 8:45 AM Medical Record Number: [id number] Patient Account Number: [id number] Date of Birth/Sex: [birth year] (85 y.o. M) Treating RN: Montey Hora Primary Care Adena Sima: [name] Other Clinician: Referring Chae Oommen: [name] Treating Fatou Dunnigan/Extender: Melburn Hake, HOYT Weeks in Treatment: 3 Electronic Signature(s) Signed: 07/13/2018 12:06:33 PM By: Paulla Fore, RRT, CHT Signed: 07/13/2018 2:25:55 PM By: Montey Hora Entered By: Lorine Bears on 07/13/2018 08:58:34 Evan [name], Evan [name] ([id number]) -------------------------------------------------------------------------------- Multi Wound Chart Details Patient Name: Evan [name] Date of Service: 07/13/2018 8:45 AM Medical Record Number: [id number] Patient Account Number: [id number] Date of Birth/Sex: 1928-03-03 (85 y.o. M) Treating RN: Montey Hora Primary Care Niti Leisure: [name] Other Clinician: Referring Bohdan Macho: [name] Treating Jamair Cato/Extender: Melburn Hake, HOYT Weeks in Treatment: 3 Vital Signs Height(in): 65 Pulse(bpm): 74 Weight(lbs): 175 Blood Pressure(mmHg): 112/52 Body Mass Index(BMI): 29 Temperature(F): 97.9 Respiratory Rate 18 (breaths/min): Photos: [N/A:N/A] Wound Location: Right  Forearm N/A N/A Wounding Event: Trauma N/A N/A Primary Etiology: Trauma, Other N/A N/A Comorbid History: Cataracts, Congestive Heart N/A N/A Failure, Hypertension, Rheumatoid Arthritis, Received Radiation Date Acquired: 06/01/2018 N/A N/A Weeks of Treatment: 3 N/A N/A Wound Status: Open N/A N/A Measurements L x W x D 3.3x2.7x0.1 N/A N/A (cm) Area (cm) : 6.998 N/A N/A Volume (cm) : 0.7 N/A N/A % Reduction in Area: 49.10% N/A N/A % Reduction in Volume: 91.50% N/A N/A Classification: Partial Thickness N/A N/A  Exudate Amount: Large N/A N/A Exudate Type: Sanguinous N/A N/A Exudate Color: red N/A N/A Wound Margin: Flat and Intact N/A N/A Granulation Amount: Large (67-100%) N/A N/A Granulation Quality: Red N/A N/A Necrotic Amount: Small (1-33%) N/A N/A Exposed Structures: Fat Layer (Subcutaneous N/A N/A Tissue) Exposed: Yes Fascia: No Tendon: No Muscle: No Evan [name], Evan [name] ([id number]) Joint: No Bone: No Epithelialization: None N/A N/A Treatment Notes Electronic Signature(s) Signed: 07/13/2018 2:25:55 PM By: Montey Hora Entered By: Montey Hora on 07/13/2018 09:06:48 Evan [name] ([id number]) -------------------------------------------------------------------------------- Multi-Disciplinary Care Plan Details Patient Name: Evan [name] Date of Service: 07/13/2018 8:45 AM Medical Record Number: [id number] Patient Account Number: [id number] Date of Birth/Sex: 15-Apr-1927 (85 y.o. M) Treating RN: Montey Hora Primary Care Calistro Rauf: [name] Other Clinician: Referring Laelia Angelo: [name] Treating Nerida Boivin/Extender: Melburn Hake, HOYT Weeks in Treatment: 3 Active Inactive Electronic Signature(s) Signed: 08/29/2018 10:25:10 AM By: Gretta Cool, BSN, RN, CWS, Kim RN, BSN Signed: 11/30/2018 1:04:33 PM By: Montey Hora Previous Signature: 07/13/2018 2:25:55 PM Version By: Montey Hora Entered By: Gretta Cool BSN, RN, CWS, Kim on 08/29/2018 10:25:10 Evan [name], Evan [name]  ([id number]) -------------------------------------------------------------------------------- Pain Assessment Details Patient Name: Evan [name] Date of Service: 07/13/2018 8:45 AM Medical Record Number: [id number] Patient Account Number: [id number] Date of Birth/Sex: [birth year] (85 y.o. M) Treating RN: Montey Hora Primary Care Krystina Strieter: [name] Other Clinician: Referring Castulo Scarpelli: [name] Treating Ladd Cen/Extender: Melburn Hake, HOYT Weeks in Treatment: 3 Active Problems Location of Pain Severity and Description of Pain Patient Has Paino No Site Locations Pain Management and Medication Current Pain Management: Electronic Signature(s) Signed: 07/13/2018 12:06:33 PM By: Paulla Fore, RRT, CHT Signed: 07/13/2018 2:25:55 PM By: Montey Hora Entered By: Lorine Bears on 07/13/2018 08:50:39 Evan [name] ([id number]) -------------------------------------------------------------------------------- Patient/Caregiver Education Details Patient Name: Evan [name] Date of Service: 07/13/2018 8:45 AM Medical Record Number: [id number] Patient Account Number: [id number] Date of Birth/Gender: [birth year] (85 y.o. M) Treating RN: Montey Hora Primary Care Physician: [name] Other Clinician: Referring Physician: [name] Treating Physician/Extender: Sharalyn Ink in Treatment: 3 Education Assessment Education Provided To: Patient and Caregiver Education Topics Provided Wound/Skin Impairment: Handouts: Other: wound care as ordered Methods: Demonstration, Explain/Verbal Responses: State content correctly Electronic Signature(s) Signed: 07/13/2018 2:25:55 PM By: Montey Hora Entered By: Montey Hora on 07/13/2018 09:10:42 Evan [name] ([id number]) -------------------------------------------------------------------------------- Wound Assessment Details Patient Name: Evan [name] Date of Service: 07/13/2018 8:45  AM Medical Record Number: [id number] Patient Account Number: [id number] Date of Birth/Sex: 1928-02-26 (85 y.o. M) Treating RN: Montey Hora Primary Care Danelle Curiale: [name] Other Clinician: Referring Ra Pfiester: [name] Treating Anquan Azzarello/Extender: Melburn Hake, HOYT Weeks in Treatment: 3 Wound Status Wound Number: 1 Primary Trauma, Other Etiology: Wound Location: Right Forearm Wound Open Wounding Event: Trauma Status: Date Acquired: 06/01/2018 Comorbid Cataracts, Congestive Heart Failure, Weeks Of Treatment: 3 History: Hypertension, Rheumatoid Arthritis, Received Clustered Wound: No Radiation Photos Wound Measurements Length: (cm) 3.3 % Reduction in Width: (cm) 2.7 % Reduction in Depth: (cm) 0.1 Epithelializati Area: (cm) 6.998 Tunneling: Volume: (cm) 0.7 Undermining: Area: 49.1% Volume: 91.5% on: None No No Wound Description Classification: Partial Thickness Foul Odor Afte Wound Margin: Flat and Intact Slough/Fibrino Exudate Amount: Large Exudate Type: Sanguinous Exudate Color: red r Cleansing: No No Wound Bed Granulation Amount: Large (67-100%) Exposed Structure Granulation Quality: Red Fascia Exposed: No Necrotic Amount: Small (1-33%) Fat Layer (Subcutaneous Tissue) Exposed: Yes Necrotic Quality: Adherent Slough Tendon Exposed: No Muscle Exposed: No Joint Exposed: No Bone Exposed: No Periwound Skin Texture  Texture Color Evan [name], Evan [name] ([id number]) No Abnormalities Noted: No No Abnormalities Noted: No Callus: No Atrophie Blanche: No Crepitus: No Cyanosis: No Excoriation: No Ecchymosis: No Induration: No Erythema: No Rash: No Hemosiderin Staining: No Scarring: No Mottled: No Pallor: No Moisture Rubor: No No Abnormalities Noted: No Dry / Scaly: No Temperature / Pain Maceration: No Tenderness on Palpation: Yes Electronic Signature(s) Signed: 07/13/2018 12:06:33 PM By: Lorine Bears RCP, RRT, CHT Signed: 07/13/2018  2:25:55 PM By: Montey Hora Entered By: Lorine Bears on 07/13/2018 08:58:22 Evan [name] ([id number]) -------------------------------------------------------------------------------- [place] Details Patient Name: Evan [name] Date of Service: 07/13/2018 8:45 AM Medical Record Number: [id number] Patient Account Number: [id number] Date of Birth/Sex: 1927-07-03 (85 y.o. M) Treating RN: Montey Hora Primary Care Aileana Hodder: [name] Other Clinician: Referring Kolbi Altadonna: [name] Treating Janeya Deyo/Extender: Melburn Hake, HOYT Weeks in Treatment: 3 Vital Signs Time Taken: 08:50 Temperature (F): 97.9 Height (in): 65 Pulse (bpm): 74 Weight (lbs): 175 Respiratory Rate (breaths/min): 18 Body Mass Index (BMI): 29.1 Blood Pressure (mmHg): 112/52 Reference Range: 80 - 120 mg / dl Electronic Signature(s) Signed: 07/13/2018 12:06:33 PM By: Lorine Bears RCP, RRT, CHT Entered By: Lorine Bears on 07/13/2018 08:53:14

## 2018-07-16 NOTE — Consult Note (Signed)
[place] Nurse wound consult note Patient receiving care in [facility] 3W10. Reason for Consult: Right forearm wound Wound type: healing skin tear?  Patient unable to contribute to how wound formed Measurement: 3.3 cm x 2.3 cm x no measureable depth Wound bed: 100% pink, clean Drainage (amount, consistency, odor) yellow on existing foam dressing. Periwound: intact Dressing procedure/placement/frequency: Cleanse area with soap and water. Pat dry. Apply a foam dressing.  Change every 3 days. Monitor the wound area(s) for worsening of condition such as: Signs/symptoms of infection,  Increase in size,  Development of or worsening of odor, Development of pain, or increased pain at the affected locations.  Notify the medical team if any of these develop.  Thank you for the consult.  Discussed plan of care with the patient and bedside nurse.  [place] nurse will not follow at this time.  Please re-consult the [facility] team if needed.  Val Riles, RN, MSN, CWOCN, CNS-BC, pager [phone]

## 2018-07-16 NOTE — Progress Notes (Signed)
STROKE TEAM PROGRESS NOTE   SUBJECTIVE (INTERVAL HISTORY) Patient remains neurologically stable he passed swallow eval and was cleared for dysphagia 2 diet.  He has been seen by inpatient rehab team and felt to be a good candidate pending insurance approval and bed availability.  OBJECTIVE Vitals:   07/15/18 2025 07/15/18 2313 07/16/18 0415 07/16/18 0755  BP: 99/68 (!) 142/68 (!) 136/97 (!) 114/59  Pulse: 76 89 69 86  Resp: (!) 32   18  Temp: 99.3 F (37.4 C) 98.8 F (37.1 C) 98.1 F (36.7 C) 98.7 F (37.1 C)  TempSrc: Oral Oral Oral Oral  SpO2: 98% 98% 99% 95%  Weight:      Height:        CBC:  Recent Labs  Lab 07/14/18 0536 07/15/18 0437 07/16/18 0542  WBC 7.5 7.2 8.6  NEUTROABS 5.2  --   --   HGB 11.7* 11.2* 11.8*  HCT 36.6* 34.3* 35.3*  MCV 96.1 95.0 94.4  PLT 134* 132* 129*    Basic Metabolic Panel:  Recent Labs  Lab 07/15/18 0437 07/16/18 0542  NA 136 136  K 4.0 4.2  CL 106 102  CO2 20* 21*  GLUCOSE 106* 127*  BUN 18 18  CREATININE 1.61* 1.44*  CALCIUM 8.5* 9.0    Lipid Panel:     Component Value Date/Time   CHOL 75 02/03/2018 0524   CHOL 112 01/19/2018 0929   CHOL 97 09/09/2013 0957   TRIG 60 02/03/2018 0524   TRIG 92 09/09/2013 0957   HDL 28 (L) 02/03/2018 0524   HDL 35 (L) 01/19/2018 0929   HDL 37 (L) 09/09/2013 0957   CHOLHDL 2.7 02/03/2018 0524   VLDL 12 02/03/2018 0524   LDLCALC 35 02/03/2018 0524   LDLCALC 59 01/19/2018 0929   LDLCALC 42 09/09/2013 0957   HgbA1c:  Lab Results  Component Value Date   HGBA1C 5.3 12/02/2016   Urine Drug Screen:     Component Value Date/Time   LABOPIA NONE DETECTED 07/14/2018 0536   COCAINSCRNUR NONE DETECTED 07/14/2018 0536   LABBENZ POSITIVE (A) 07/14/2018 0536   AMPHETMU NONE DETECTED 07/14/2018 0536   THCU NONE DETECTED 07/14/2018 0536   LABBARB NONE DETECTED 07/14/2018 0536    Alcohol Level     Component Value Date/Time   ETH <10 07/14/2018 0536    IMAGING  CTA HEAD AND NECK   07/14/2018 IMPRESSION  1. Negative CTA for large vessel occlusion. No acute vascular abnormality seen underlying the left thalamic hemorrhage. No aneurysm.  2. Atheromatous change about the carotid bifurcations with up to 50% narrowing on the right and 65% narrowing on the left.  3. Moderate diffuse carotid siphon atherosclerosis with moderate to advanced narrowing, left worse than right.  4. Fetal type origin of the PCAs with overall diminutive vertebrobasilar system.  5. Azygos ACA.  6. Mildly enlarged right paratracheal adenopathy, indeterminate, but could be reactive.   CT MAXILLOFACIAL  IMPRESSION  1. Soft tissue contusion with superimposed 3.4 cm hematoma at the right anterior face/neck.  2. No other acute maxillofacial injury.  No fracture.  3. Chronic left maxillary sinusitis.   CT CERVICAL SPINE  IMPRESSION  1. No acute traumatic injury within the cervical spine.  2. Moderate cervical spondylolysis at C3-4 through C6-7, with resultant moderate to severe spinal stenosis at C4-5.   Ct Head Code Stroke Wo Contrast 07/14/2018 IMPRESSION:  1. Acute intraparenchymal hemorrhage centered at the left thalamus, estimated volume 2.7 cc. Mild surrounding edema without significant  regional mass effect.  2. Underlying age-related cerebral atrophy with moderate chronic small vessel ischemic disease.  Ct Head Wo Contrast 07/15/2018 IMPRESSION: Stable hemorrhage left thalamus.  No change from yesterday.  DG ABD 2 Views 07/15/2018 IMPRESSION: Nonobstructive bowel gas pattern. Nonspecific chronic reticular opacities at the lung bases.  EKG -  SR rate 80 BPM. (See cardiology reading for complete details)  Bilateral Lower Extremity Venous Dopplers 07/14/2018 Summary: Right: There is no evidence of deep vein thrombosis in the lower extremity. However, portions of this examination were limited- see technologist comments above. Left: Findings consistent with chronic deep vein thrombosis  involving the left peroneal vein.   PHYSICAL EXAM  General -elderly Caucasian male in no apparent distress,   Ophthalmologic - fundi not visualized due to noncooperation.  Cardiovascular - Regular rate and rhythm.  Mental Status -  Level of arousal and orientation to time, place, and person were intact. Language including expression, naming, repetition, comprehension was assessed and found intact.  Cranial Nerves II - XII - II - Visual field intact OU. III, IV, VI - Extraocular movements intact. V - Facial sensation intact bilaterally. VII - Facial movement intact bilaterally. VIII -significantly diminished hearing bilaterally. X - Palate elevates symmetrically. XI - Chin turning & shoulder shrug intact bilaterally. XII - Tongue protrusion intact.  Motor Strength - The patient's strength was normal in LUE and LLE, however, mild right hemiparesis with 3/5 weakness proximally and 4/5 distally.  Bulk was normal and fasciculations were absent.   Motor Tone - Muscle tone was assessed at the neck and appendages and was normal.  Reflexes - The patient's reflexes were symmetrical in all extremities and he had no pathological reflexes.  Sensory - Light touch, temperature/pinprick were assessed and were symmetrical.    Coordination - mild ataxia on the RUE and RLE, out of proportion to the weakness.  Tremor was absent.  Gait and Station - deferred.   ASSESSMENT/PLAN [name] is a 85 y.o. male with history of adenocarcinoma of the lung in remission, Htn, Hld, Hemochromatosis, CAD with stent, atrial fibrillation and PE on therapeutic Lovenox (failed eliquis), and hearing impaired found on the floor by family with right hemiparesis. He did not receive IV t-PA due to [place].  ICH: Acute small ICH at the left thalamus, likely hypertensive in the setting of therapeutic lovenox coagulopathy  Resultant  Right hemiparesis and ataxia  CT head - Acute intraparenchymal hemorrhage centered at  the left thalamus, estimated volume 2.7 cc. Mild surrounding edema without significant regional mass effect.   CTA H&N - Atheromatous change about the carotid bifurcations with up to 50% narrowing on the right and 65% narrowing on the left. Moderate athero b/l siphon, L>R. overall diminutive vertebrobasilar system.   CT repeat (07/15/2018) - Stable hemorrhage left thalamus.  No change from yesterday.  2D Echo - 04/2018 - EF 60-65%  LDL - not performed  HgbA1c - not performed  UDS - benzodiazepines (on home ativan)  VTE prophylaxis - SCDs  Lovenox 80 mg [place] Q12 hrs prior to admission, now on No antithrombotic  Ongoing aggressive stroke risk factor management  Therapy recommendations:  CIR  Disposition:  Pending  PAF Hx of PE  Currently rate controlled  On levonox therapeutic dose PTA  LE venous doppler - chronic DVT at left peroneal vein.  Currently on no antithrombotics due to [place]  No need of IVC filter as no acute DVT  S/p fall  Facial contusion   C-collar cleared  by CCM  Hypertension  Bp as high as 167/137  Treated with cleviprex in ICU  Stable today . SBP goal < 160 mm Hg . Resume home BP meds - metoprolol, lasix . Long-term BP goal normotensive  Hyperlipidemia  Lipid lowering medication PTA: Crestor 5 mg daily  Current lipid lowering medication: resumed crestor  Continue statin at discharge  Other Stroke Risk Factors  Advanced age  Coronary artery disease  Other Active Problems  Hx of Fall with SDH in 09/2016  Mildly enlarged right paratracheal adenopathy, indeterminate, but could be reactive. Adenocarcinoma of the lung in remission.  Creatinine 1.52->1.61->1.44  Thrombocytopenia - 129  R forearm wound, ? Skin tear. WOC rec clean w/ soap and water, apply foam dsg and change q3d  Hospital day # 2  I have personally obtained history,examined this patient, reviewed notes, independently viewed imaging studies, participated in medical  decision making and plan of care.ROS completed by me personally and pertinent positives fully documented  I have made any additions or clarifications directly to the above note.   The patient appears to be neurologically stable to be transferred to inpatient rehab when bed is available.  Discussed with rehab coordinator.  Greater than 50% time during this 25-minute visit was spent in counseling and coordination of care and discussion of treatment plan with care team  Antony Contras, MD Medical Director [place] Pager: [phone] 07/16/2018 1:50 PM   To contact Stroke Continuity provider, please refer to [email or web address] After hours, contact General Neurology

## 2018-07-16 NOTE — H&P (Signed)
Physical Medicine and Rehabilitation Admission H&P        Chief Complaint  Patient presents with  . Functional defi      HPI: Evan [name] is an 85 year old male with history of CAD, RA, RLL adenocarcinoma, possible radiation pneumonitis, A fib, DVT/PE--on treatment dose Lovenox, SDH 09/2016, recent admission for RUE hematoma/cellulitis, chronic thrombocytopenia, who was admitted on 07/14/18 after being found on the floor with difficulty moving right side.  CT head done revealing acute left thalamic intraparenchymal hemorrhage felt to be likely hypertensive in setting of coagulopathy.  Elevated blood pressure treated with Cleviprex and Lovenox DC'd..  Lovenox placed on hold and Dr. Delton Coombes recommended checking BLE Dopplers as well as IVC filter if significant lower extremity clot burden noticed.  Dopplers revealed chronic left peroneal vein DVT therefore no need for IVC filter but PCCM recommending resuming anticoagulation when deemed safe.   CTA head neck showed atheromatous change about carotid bifurcation with  50% narrowing right and 65% narrowing left and mildly enlarged right paratracheal adenopathy--indeterminate/question reactive.  Maxillofacial CT showed soft tissue contusion with 3.4 cm hematoma right anterior face/neck.  CT cervical spine was negative for injury and showed moderate cervical spondylosis C3-4 through C6-7 with moderate to severe spinal stenosis see 4/5.  Follow-up CT head 4/12 showed stable left thalamic hemorrhage and neurology recommends holding all anticoagulation at this time. He was started on dysphagia 2 diet and currently noted to have significant cognitive impairment but could be affected by significant hearing loss.       Review of Systems  Unable to perform ROS: Other  Genitourinary:       Incontinent?          Past Medical History:  Diagnosis Date  . 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  . Anginal chest pain at rest [facility])      Chronic non,  controlled on Lorazepam  . Anxiety    . Arthritis      "all over"   . BPH (benign prostatic hyperplasia)    . CAD (coronary artery disease)    . Chronic lower back pain    . Complication of anesthesia      "problems making water afterwards"  . Depression    . DJD (degenerative joint disease)    . Esophageal ulcer without bleeding      bid ppi indefinitely  . Family history of adverse reaction to anesthesia      "children w/PONV"  . GERD (gastroesophageal reflux disease)    . Headache      "couple/week maybe" (09/04/2015)  . Hemochromatosis      Possible, elevated iron stores, hook-like osteophytes on hand films, normal LFTs  . Hepatitis      "yellow jaundice as a baby"  . History of ASCVD      MULTIVESSEL  . Hyperlipidemia    . Hypertension    . Myocardial infarction (HCC) 1999  . Osteoarthritis    . Pneumonia      "several times; got a little now" (09/04/2015)  . Rheumatoid arthritis (HCC)    . Subdural hematoma (HCC)      small after fall 09/2016-plavix held, neurosurgery consulted.  Asymptomatic.    Marland Kitchen 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HCC) 02/2018    on Lovenox lifelong since he failed PO Eliquis           Past Surgical History:  Procedure Laterality Date  . ARM SKIN LESION BIOPSY / EXCISION Left 09/02/2015  . CATARACT EXTRACTION  W/ INTRAOCULAR LENS  IMPLANT, BILATERAL Bilateral    . CHOLECYSTECTOMY OPEN      . CORNEAL TRANSPLANT Bilateral      "one at [facility]; one at [facility]"  . CORONARY ANGIOPLASTY WITH STENT PLACEMENT      . CORONARY ARTERY BYPASS GRAFT   1999  . DESCENDING AORTIC ANEURYSM REPAIR W/ STENT        DE stent ostium into the right radial free graft at OM-, 02-2007  . ESOPHAGOGASTRODUODENOSCOPY N/A 11/09/2014    Procedure: ESOPHAGOGASTRODUODENOSCOPY (EGD);  Surgeon: Dorena Cookey, MD;  Location: [facility] ENDOSCOPY;  Service: Endoscopy;  Laterality: N/A;  . INGUINAL HERNIA REPAIR      . JOINT REPLACEMENT      . LEFT HEART CATH AND CORS/GRAFTS ANGIOGRAPHY N/A 06/27/2017    Procedure: LEFT  HEART CATH AND CORS/GRAFTS ANGIOGRAPHY;  Surgeon: Lennette Bihari, MD;  Location: [facility] INVASIVE CV LAB;  Service: Cardiovascular;  Laterality: N/A;  . NASAL SINUS SURGERY      . SHOULDER OPEN ROTATOR CUFF REPAIR Right    . TOTAL KNEE ARTHROPLASTY Bilateral             Family History  Problem Relation Age of Onset  . Arthritis-Osteo Sister    . Heart attack Brother    . Heart attack Other    . Cancer Neg Hx        Social History:  reports that he has never smoked. He has quit using smokeless tobacco.  His smokeless tobacco use included chew. He reports that he does not drink alcohol or use drugs.            Allergies  Allergen Reactions  . Feldene [Piroxicam] Other (See Comments)      REACTION: Blisters  . Statins Other (See Comments)      Myalgias  . Doxycycline Other (See Comments)      REACTION:  unknown  . Latex Rash  . Tequin Anxiety  . Valium Other (See Comments)      REACTION:  unknown            Medications Prior to Admission  Medication Sig Dispense Refill  . acetaminophen (TYLENOL) 500 MG tablet Take 1,000 mg by mouth every 6 (six) hours as needed for mild pain.      . camphor-menthol (SARNA) lotion Apply topically as needed for itching. (Patient taking differently: Apply 1 application topically daily as needed for itching. ) 222 mL 0  . cephALEXin (KEFLEX) 500 MG capsule Take 1 capsule (500 mg total) by mouth 4 (four) times daily. 28 capsule 0  . Cholecalciferol 2000 units TABS Take 1 tablet (2,000 Units total) by mouth daily. 30 each 11  . enoxaparin (LOVENOX) 80 MG/0.8ML injection Inject 0.8 mLs (80 mg total) into the skin every 12 (twelve) hours.      . ENSURE (ENSURE) Take 237 mLs by mouth every other day.       Marland Kitchen FLUoxetine (PROZAC) 20 MG tablet Take 20 mg by mouth daily.      . furosemide (LASIX) 20 MG tablet Take 1 tablet (20 mg total) once a day. Take an extra tablet (20 mg total) for increased swelling, weight gain, or shortness of breath. 180 tablet 3  .  hydrOXYzine (ATARAX/VISTARIL) 25 MG tablet Take 1 tablet (25 mg total) by mouth every 8 (eight) hours as needed for itching. 20 tablet 0  . Lifitegrast (XIIDRA) 5 % SOLN Place 1-2 drops into both eyes daily.       Marland Kitchen  LORazepam (ATIVAN) 1 MG tablet Taking 1mg  in the AM, 0.5mg  (1/2 tablet) at lunch and 1/2 tablet (0.5mg ) at bedtime. 90 tablet 0  . magnesium oxide (MAG-OX) 400 (241.3 Mg) MG tablet Take 1 tablet (400 mg total) by mouth 2 (two) times daily. 180 tablet 3  . metoprolol succinate (TOPROL-XL) 25 MG 24 hr tablet TAKE ONE (1) TABLET BY MOUTH EVERY DAY 90 tablet 3  . nitroGLYCERIN (NITROSTAT) 0.4 MG SL tablet Place 1 tablet (0.4 mg total) under the tongue every 5 (five) minutes as needed for chest pain. 25 tablet 2  . pantoprazole (PROTONIX) 40 MG tablet Take one (1) tablet (40 mg) by mouth daily. (Patient taking differently: Take 40 mg by mouth every other day. ) 90 tablet 3  . predniSONE (DELTASONE) 20 MG tablet 3 tabs poqday 1-2, 2 tabs poqday 3-4, 1 tab poqday 5-6 (Patient taking differently: Take 20-60 mg by mouth See admin instructions. 3 tabs poqday 1-2, 2 tabs poqday 3-4, 1 tab poqday 5-6) 12 tablet 0  . rosuvastatin (CRESTOR) 5 MG tablet TAKE ONE (1) TABLET BY MOUTH EVERY DAY 90 tablet 3  . tamsulosin (FLOMAX) 0.4 MG CAPS capsule TAKE 1 CAPSULE EVERY DAY 90 capsule 3  . predniSONE (DELTASONE) 5 MG tablet Label  & dispense according to the schedule below. take 8 Pills PO for 3 days, 6 Pills PO for 3 days, 4 Pills PO for 3 days, 2 Pills PO for 3 days, 1 Pills PO for 3 days, 1/2 Pill  PO for 3 days then STOP. Total 65 pills. (Patient not taking: Reported on 07/14/2018) 65 tablet 0      Drug Regimen Review  Drug regimen was reviewed and remains appropriate with no significant issues identified   Home: Home Living Family/patient expects to be discharged to:: Private residence Living Arrangements: Children(son) Available Help at Discharge: Family, Available 24 hours/day Type of Home:  House Home Access: Stairs to enter [employer] of Steps: 4 Home Layout: Able to live on main level with bedroom/bathroom, Laundry or work area in basement [employer] Shower/Tub: Heritage manager Toilet: Handicapped height Home Equipment: Environmental consultant - 2 wheels, Information systems manager, Medical laboratory scientific officer - single point Additional Comments: per chart review, pt unable to report home setup at this time    Functional History: Prior Function Level of Independence: Independent Comments: pt reports independent mobility and self care, and per chart review independent; diffcult to discern as pt HOH    Functional Status:  Mobility: Bed Mobility Overal bed mobility: Needs Assistance Bed Mobility: Supine to Sit Supine to sit: Min assist Sit to supine: Mod assist General bed mobility comments: max directional v/c's, R LE requiring increased time to move towards R side of bed, pt used L UE to reach for bed rail and pull up, HOB slightly elevated Transfers Overall transfer level: Needs assistance Equipment used: Rolling walker (2 wheeled), 2 person hand held assist Transfers: Sit to/from Stand, Stand Pivot Transfers Sit to Stand: Min assist, Mod assist, +2 physical assistance Stand pivot transfers: Mod assist, +2 physical assistance General transfer comment: completed several sit/stand attempts, with and without RW. Pt initially can power up and stand with R LE locked in extension however pt fatigues and quickly buckles/demos R knee instability, pt able to advance R LE however ataxic and difficulty placing and controlling, able to lift L LE x2 prior to R knee buckling requiring modA to block R knee. Pt requierd modAX2 for std pvt to chair as patient unable to sequence or coordinate  setpping pattern. Pt attempted to stand up to RW however pt unable to grip wih R hand and with signifficant  Rlateral lean without less physical assist   ADL: ADL Overall ADL's : Needs assistance/impaired Eating/Feeding: Bed level,  Minimal assistance Eating/Feeding Details (indicate cue type and reason): min assist to setup using L UE, cueing to use L UE to increase ease and independence at this time  Grooming: Moderate assistance, Sitting Grooming Details (indicate cue type and reason): perseverates on washing face, able to wash hands with min assist given multimodal cueing  Upper Body Bathing: Moderate assistance, Sitting Lower Body Bathing: Maximal assistance, Bed level Upper Body Dressing : Moderate assistance, Sitting Lower Body Dressing: Total assistance, Sitting/lateral leans Toilet Transfer Details (indicate cue type and reason): deferred due to safety Functional mobility during ADLs: Moderate assistance(limited to bed mobility only) General ADL Comments: pt limited by cognition, R hemiparesis, impaired balance and decreased act tolerance   Cognition: Cognition Overall Cognitive Status: Impaired/Different from baseline Orientation Level: Oriented to person Cognition Arousal/Alertness: Awake/alert Behavior During Therapy: WFL for tasks assessed/performed Overall Cognitive Status: Impaired/Different from baseline Area of Impairment: Safety/judgement Orientation Level: Disoriented to, Time, Situation Current Attention Level: Sustained Memory: Decreased short-term memory Following Commands: Follows one step commands with increased time, Follows multi-step commands inconsistently Safety/Judgement: Decreased awareness of deficits Awareness: Emergent Problem Solving: Difficulty sequencing, Requires verbal cues, Requires tactile cues General Comments: pt extremely HOH which can contribute to difficulty following commands, pt improved from yesterday, R sided neglect     Blood pressure (!) 114/59, pulse 86, temperature 98.7 F (37.1 C), temperature source Oral, resp. rate 18, height 5\' 5"  (1.651 m), weight 79.8 kg, SpO2 95 %. Physical Exam  Nursing note and vitals reviewed. Constitutional: He appears  well-developed and well-nourished.  Lying in high low bed with legs off the edge.   HENT:  Head: Normocephalic and atraumatic.  Eyes: Pupils are equal, round, and reactive to light. EOM are normal.  Neck: Normal range of motion.  Right neck with layering ecchymosis and hard indurated area under chin. Foam dressing with dry blood.   Cardiovascular: Normal rate and regular rhythm. Exam reveals no friction rub.  No murmur heard. Respiratory: Effort normal. Insp/exp wheezes, no obvious distress GI: Soft.  Genitourinary:    Genitourinary Comments: Condom cath in place. Incontinent of bowel.    Musculoskeletal:        General: No edema.  Neurological: He is alert.  HOH. Oriented to self and place "my 2nd home- hospital". Moves all 4, left more spontaneously than right. Inconsistent participation in exam. Distracted.   Skin:  Bruising right neck, round area of breakdown right arm adhered to dressing.  Psychiatric:  Pleasant but distracted, confused      Lab Results Last 48 Hours        Results for orders placed or performed during the hospital encounter of 07/14/18 (from the past 48 hour(s))  CBC     Status: Abnormal    Collection Time: 07/15/18  4:37 AM  Result Value Ref Range    WBC 7.2 4.0 - 10.5 K/uL    RBC 3.61 (L) 4.22 - 5.81 MIL/uL    Hemoglobin 11.2 (L) 13.0 - 17.0 g/dL    HCT 16.1 (L) 09.6 - 52.0 %    MCV 95.0 80.0 - 100.0 fL    MCH 31.0 26.0 - 34.0 pg    MCHC 32.7 30.0 - 36.0 g/dL    RDW 04.5 40.9 - 81.1 %  Platelets 132 (L) 150 - 400 K/uL    nRBC 0.0 0.0 - 0.2 %      Comment: Performed at [facility], [address], [place], [place] [address]  Basic metabolic panel     Status: Abnormal    Collection Time: 07/15/18  4:37 AM  Result Value Ref Range    Sodium 136 135 - 145 mmol/L    Potassium 4.0 3.5 - 5.1 mmol/L    Chloride 106 98 - 111 mmol/L    CO2 20 (L) 22 - 32 mmol/L    Glucose, Bld 106 (H) 70 - 99 mg/dL    BUN 18 8 - 23 mg/dL    Creatinine, Ser 2.13  (H) 0.61 - 1.24 mg/dL    Calcium 8.5 (L) 8.9 - 10.3 mg/dL    GFR calc non Af Amer 38 (L) >60 mL/min    GFR calc Af Amer 44 (L) >60 mL/min    Anion gap 10 5 - 15      Comment: Performed at [facility], [address], [place], [place] [address]  CBC     Status: Abnormal    Collection Time: 07/16/18  5:42 AM  Result Value Ref Range    WBC 8.6 4.0 - 10.5 K/uL    RBC 3.74 (L) 4.22 - 5.81 MIL/uL    Hemoglobin 11.8 (L) 13.0 - 17.0 g/dL    HCT 84.6 (L) 96.2 - 52.0 %    MCV 94.4 80.0 - 100.0 fL    MCH 31.6 26.0 - 34.0 pg    MCHC 33.4 30.0 - 36.0 g/dL    RDW 95.2 84.1 - 32.4 %    Platelets 129 (L) 150 - 400 K/uL    nRBC 0.0 0.0 - 0.2 %      Comment: Performed at [facility], [address] [place], [place] [address]  Basic metabolic panel     Status: Abnormal    Collection Time: 07/16/18  5:42 AM  Result Value Ref Range    Sodium 136 135 - 145 mmol/L    Potassium 4.2 3.5 - 5.1 mmol/L    Chloride 102 98 - 111 mmol/L    CO2 21 (L) 22 - 32 mmol/L    Glucose, Bld 127 (H) 70 - 99 mg/dL    BUN 18 8 - 23 mg/dL    Creatinine, Ser 7.25 (H) 0.61 - 1.24 mg/dL    Calcium 9.0 8.9 - 36.6 mg/dL    GFR calc non Af Amer 43 (L) >60 mL/min    GFR calc Af Amer 50 (L) >60 mL/min    Anion gap 13 5 - 15      Comment: Performed at [facility], [address], [place], [place] [address]       Imaging Results (Last 48 hours)  Ct Head Wo Contrast   Result Date: 07/15/2018 CLINICAL DATA:  Intracranial hemorrhage follow-up. EXAM: CT HEAD WITHOUT CONTRAST TECHNIQUE: Contiguous axial images were obtained from the base of the skull through the vertex without intravenous contrast. COMPARISON:  CT 07/14/2018 FINDINGS: Brain: Left thalamic hematoma is unchanged measuring 16 x 24 mm. Mild surrounding edema. No new area of hemorrhage or infarction. Ventricle size normal. No significant midline shift. Vascular: Negative for hyperdense vessel.  Vascular calcification. Skull: Negative Sinuses/Orbits:  Extensive mucoperiosteal thickening left maxillary sinus. Mild mucoperiosteal thickening right maxillary sinus. Right mastoidectomy. Chronic mastoiditis on the left. Other: None IMPRESSION: Stable hemorrhage left thalamus.  No change  from yesterday. Electronically Signed   By: Marlan Palau M.D.   On: 07/15/2018 07:07    Ct Head Wo Contrast   Result Date: 07/14/2018 CLINICAL DATA:  Follow-up acute hemorrhagic stroke/hypertensive hemorrhage. EXAM: CT HEAD WITHOUT CONTRAST 12:20 p.m.: TECHNIQUE: Contiguous axial images were obtained from the base of the skull through the vertex without intravenous contrast. COMPARISON:  CT brain earlier same day at 5:48 a.m. and previously. FINDINGS: Brain: Acute hemorrhage in the LEFT thalamus and POSTERIOR LEFT basal ganglia is unchanged since earlier this morning, still measuring approximately 1.5 x 2.2 x 1.9 cm. Minimal mass effect with no midline shift. No new intracranial hemorrhage or hematoma elsewhere. Moderate to severe cortical atrophy, moderate deep atrophy and mild to moderate chronic microvascular ischemic changes of the white matter have not significantly changed dating back to at least 2017. Vascular: Severe BILATERAL carotid siphon atherosclerosis. Moderate vertebrobasilar atherosclerosis. Skull: No skull fracture or other focal osseous abnormality involving the skull. Sinuses/Orbits: Mucosal thickening involving the LEFT maxillary sinus with marked thickening of the sinus walls, indicating chronicity. Minimal mucosal thickening involving the RIGHT maxillary sinus. Opacification a POSTERIOR LEFT ethmoid air cell. Frontal sinuses and sphenoid sinuses well aerated. Prior RIGHT mastoidectomy. Chronic LEFT mastoid effusion. Visualized orbits and globes normal in appearance. Other: None. IMPRESSION: 1. Stable acute hemorrhage involving the LEFT thalamus and posterior LEFT basal ganglia since earlier this morning, measurements given above. 2. No new intracranial  abnormality. 3. Stable moderate to severe generalized atrophy and mild to moderate chronic microvascular ischemic changes of the white matter. 4. Stable chronic LEFT maxillary sinusitis and chronic LEFT mastoid effusion. Electronically Signed   By: Hulan Saas M.D.   On: 07/14/2018 12:47    Dg Abd 2 Views   Result Date: 07/15/2018 CLINICAL DATA:  Abdominal pain EXAM: ABDOMEN - 2 VIEW COMPARISON:  09/21/2009 CT abdomen/pelvis FINDINGS: Cholecystectomy clips are seen in the right upper quadrant of the abdomen. No dilated small bowel loops or air-fluid levels. Mild-to-moderate colorectal stool. No evidence of pneumatosis or pneumoperitoneum. Chronic reticular opacities at both lung bases, not definitely changed since 06/01/2018 chest CT. No radiopaque nephrolithiasis. Intact visualized lower sternotomy wires. IMPRESSION: Nonobstructive bowel gas pattern. Nonspecific chronic reticular opacities at the lung bases. Electronically Signed   By: Delbert Phenix M.D.   On: 07/15/2018 08:27    Vas [place] Lower Extremity Venous (dvt)   Result Date: 07/14/2018  Lower Venous Study Indications: Patient on therapeutic Lovenox, now off anticoagulants as patient has hemorrhage. Other Indications: History of left peroneal DVT 02/03/2018. Limitations: Poor ultrasound/tissue interface and positioning. Comparison Study: Prior study from 02/03/2018 is available for comparison. Performing Technologist: Sherren Kerns RVS  Examination Guidelines: A complete evaluation includes B-mode imaging, spectral Doppler, color Doppler, and power Doppler as needed of all accessible portions of each vessel. Bilateral testing is considered an integral part of a complete examination. Limited examinations for reoccurring indications may be performed as noted.  Right Venous Findings: +---------+---------------+---------+-----------+----------+--------------+          CompressibilityPhasicitySpontaneityPropertiesSummary         +---------+---------------+---------+-----------+----------+--------------+ CFV      Full           Yes      Yes                                 +---------+---------------+---------+-----------+----------+--------------+ SFJ      Full                                                        +---------+---------------+---------+-----------+----------+--------------+  FV Prox  Full                                                        +---------+---------------+---------+-----------+----------+--------------+ FV Mid   Full                                                        +---------+---------------+---------+-----------+----------+--------------+ FV Distal               Yes      Yes                  visualized     +---------+---------------+---------+-----------+----------+--------------+ POP                     Yes      Yes                  visualized     +---------+---------------+---------+-----------+----------+--------------+ PTV      Full                                                        +---------+---------------+---------+-----------+----------+--------------+ PERO                                                  Not visualized +---------+---------------+---------+-----------+----------+--------------+  Right Technical Findings: Not visualized segments include profunda and peroneal veins.  Left Venous Findings: +---------+---------------+---------+-----------+----------+----------+          CompressibilityPhasicitySpontaneityPropertiesSummary    +---------+---------------+---------+-----------+----------+----------+ CFV      Full                                                    +---------+---------------+---------+-----------+----------+----------+ SFJ      Full                                                    +---------+---------------+---------+-----------+----------+----------+ FV Prox  Full                                                     +---------+---------------+---------+-----------+----------+----------+ FV Mid   Full                                                    +---------+---------------+---------+-----------+----------+----------+ FV DistalFull                                                    +---------+---------------+---------+-----------+----------+----------+  PFV      Full                                                    +---------+---------------+---------+-----------+----------+----------+ POP                     Yes      Yes                  visualized +---------+---------------+---------+-----------+----------+----------+ PTV      Full                                                    +---------+---------------+---------+-----------+----------+----------+ PERO     Partial                                      Chronic    +---------+---------------+---------+-----------+----------+----------+    Summary: Right: There is no evidence of deep vein thrombosis in the lower extremity. However, portions of this examination were limited- see technologist comments above. Left: Findings consistent with chronic deep vein thrombosis involving the left peroneal vein.  *See table(s) above for measurements and observations.    Preliminary              Medical Problem List and Plan: 1.  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              -admit to inpatient rehab 2.  Antithrombotics: - PE/LLE DVT/anticoagulation:  Mechanical: Sequential compression devices, below knee Bilateral lower extremities             -antiplatelet therapy: N/A 3. Pain Management: tylenol prn.  4. Mood: LCSW to follow for evaluation and support.              -antipsychotic agents:  N/A. Continue ativan tid as at home and Prozac.  5. Neuropsych: This patient is not fully capable of making decisions on his own behalf. 6. Skin/Wound Care: Monitor right  neck hematoma for resolution.  7. Fluids/Electrolytes/Nutrition: Monitor I/O. Check lytes in am.  8. Chronicthrombocytopenia/ ABLA: Continue to monitor H/H/Platelets with serial checks.  Monitor for signs of bleeding. Recheck CBC in am.  9. Right forearm wound/ recent cellulitis: On Keflex--question completed course per records. Will add Aqacell to wound as per derm records.  10.  CKD: Avoid nephrotoxic medications. Monitor with serial checks.  11. H/o A fib: Monitor HR bid. Continue metoprolol bid--off Lovenox.  12. H/o Depression: On Prozac daily with ativan tid.  13. H/o Lung cancer: Had PE on Eliquis. No antiplatelets or anticoagulation for now. Contacted neurolog for input--they will follow up with recommendations.     -wheezing on exam  -scheduled and prn nebs  -check cxr  Post Admission Physician Evaluation: 1. Functional deficits secondary  to left thalamic hemorrhage. 2. Patient is admitted to receive collaborative, interdisciplinary care between the physiatrist, rehab nursing staff, and therapy team. 3. Patient's level of medical complexity and substantial therapy needs in context of that medical necessity cannot be provided at a lesser intensity of care such as a SNF. 4. Patient has experienced substantial functional  loss from his/her baseline which was documented above under the "Functional History" and "Functional Status" headings.  Judging by the patient's diagnosis, physical exam, and functional history, the patient has potential for functional progress which will result in measurable gains while on inpatient rehab.  These gains will be of substantial and practical use upon discharge  in facilitating mobility and self-care at the household level. 5. Physiatrist will provide 24 hour management of medical needs as well as oversight of the therapy plan/treatment and provide guidance as appropriate regarding the interaction of the two. 6. The Preadmission Screening has been reviewed and  patient status is unchanged unless otherwise stated above. 7. 24 hour rehab nursing will assist with bladder management, bowel management, safety, skin/wound care, disease management, medication administration, pain management and patient education  and help integrate therapy concepts, techniques,education, etc. 8. PT will assess and treat for/with: Lower extremity strength, range of motion, stamina, balance, functional mobility, safety, adaptive techniques and equipment, NMR, family ed.   Goals are: supervision to min assist. 9. OT will assess and treat for/with: ADL's, functional mobility, safety, upper extremity strength, adaptive techniques and equipment, NMR, family ed.   Goals are: supervision to min assist. Therapy may proceed with showering this patient. 10. SLP will assess and treat for/with: cognition, communication, swallowing.  Goals are: supervision. 11. Case Management and Social Worker will assess and treat for psychological issues and discharge planning. 12. Team conference will be held weekly to assess progress toward goals and to determine barriers to discharge. 13. Patient will receive at least 3 hours of therapy per day at least 5 days per week. 14. ELOS: 10-14 days       15. Prognosis:  excellent   I have personally performed a face to face diagnostic evaluation of this patient and formulated the key components of the plan.  Additionally, I have personally reviewed laboratory data, imaging studies, as well as relevant notes and concur with the physician assistant's documentation above.  Ranelle Oyster, MD, [place] [facility]       Jacquelynn Cree, PA-C 07/16/2018

## 2018-07-17 ENCOUNTER — Inpatient Hospital Stay (HOSPITAL_COMMUNITY): Payer: Medicare HMO | Admitting: Occupational Therapy

## 2018-07-17 ENCOUNTER — Inpatient Hospital Stay (HOSPITAL_COMMUNITY): Payer: Medicare HMO | Admitting: Speech Pathology

## 2018-07-17 ENCOUNTER — Inpatient Hospital Stay (HOSPITAL_COMMUNITY): Payer: Medicare HMO | Admitting: Physical Therapy

## 2018-07-17 DIAGNOSIS — N1832 Chronic kidney disease, stage 3b: Secondary | ICD-10-CM

## 2018-07-17 DIAGNOSIS — I4891 Unspecified atrial fibrillation: Secondary | ICD-10-CM

## 2018-07-17 DIAGNOSIS — I2699 Other pulmonary embolism without acute cor pulmonale: Secondary | ICD-10-CM

## 2018-07-17 DIAGNOSIS — D62 Acute posthemorrhagic anemia: Secondary | ICD-10-CM

## 2018-07-17 DIAGNOSIS — F329 Major depressive disorder, single episode, unspecified: Secondary | ICD-10-CM

## 2018-07-17 DIAGNOSIS — N1831 Chronic kidney disease, stage 3a: Secondary | ICD-10-CM

## 2018-07-17 DIAGNOSIS — F32A Depression, unspecified: Secondary | ICD-10-CM

## 2018-07-17 DIAGNOSIS — Z85118 Personal history of other malignant neoplasm of bronchus and lung: Secondary | ICD-10-CM

## 2018-07-17 DIAGNOSIS — N183 Chronic kidney disease, stage 3 unspecified: Secondary | ICD-10-CM

## 2018-07-17 DIAGNOSIS — D696 Thrombocytopenia, unspecified: Secondary | ICD-10-CM

## 2018-07-17 LAB — COMPREHENSIVE METABOLIC PANEL
ALT: 14 U/L (ref 0–44)
AST: 24 U/L (ref 15–41)
Albumin: 3.4 g/dL — ABNORMAL LOW (ref 3.5–5.0)
Alkaline Phosphatase: 109 U/L (ref 38–126)
Anion gap: 13 (ref 5–15)
BUN: 21 mg/dL (ref 8–23)
CO2: 21 mmol/L — ABNORMAL LOW (ref 22–32)
Calcium: 9.3 mg/dL (ref 8.9–10.3)
Chloride: 101 mmol/L (ref 98–111)
Creatinine, Ser: 1.57 mg/dL — ABNORMAL HIGH (ref 0.61–1.24)
GFR calc Af Amer: 45 mL/min — ABNORMAL LOW (ref >60)
GFR calc non Af Amer: 39 mL/min — ABNORMAL LOW (ref >60)
Glucose, Bld: 116 mg/dL — ABNORMAL HIGH (ref 70–99)
Potassium: 4.2 mmol/L (ref 3.5–5.1)
Sodium: 135 mmol/L (ref 135–145)
Total Bilirubin: 1.2 mg/dL (ref 0.3–1.2)
Total Protein: 6.4 g/dL — ABNORMAL LOW (ref 6.5–8.1)

## 2018-07-17 LAB — CBC WITH DIFFERENTIAL/PLATELET
Abs Immature Granulocytes: 0.1 10*3/uL — ABNORMAL HIGH (ref 0.00–0.07)
Basophils Absolute: 0.1 10*3/uL (ref 0.0–0.1)
Basophils Relative: 1 %
Eosinophils Absolute: 0.1 10*3/uL (ref 0.0–0.5)
Eosinophils Relative: 1 %
HCT: 40.4 % (ref 39.0–52.0)
Hemoglobin: 12.9 g/dL — ABNORMAL LOW (ref 13.0–17.0)
Immature Granulocytes: 1 %
Lymphocytes Relative: 12 %
Lymphs Abs: 1.3 10*3/uL (ref 0.7–4.0)
MCH: 30.9 pg (ref 26.0–34.0)
MCHC: 31.9 g/dL (ref 30.0–36.0)
MCV: 96.7 fL (ref 80.0–100.0)
Monocytes Absolute: 1.7 10*3/uL — ABNORMAL HIGH (ref 0.1–1.0)
Monocytes Relative: 15 %
Neutro Abs: 7.8 10*3/uL — ABNORMAL HIGH (ref 1.7–7.7)
Neutrophils Relative %: 70 %
Platelets: 131 10*3/uL — ABNORMAL LOW (ref 150–400)
RBC: 4.18 MIL/uL — ABNORMAL LOW (ref 4.22–5.81)
RDW: 14.2 % (ref 11.5–15.5)
WBC: 11 10*3/uL — ABNORMAL HIGH (ref 4.0–10.5)
nRBC: 0 % (ref 0.0–0.2)

## 2018-07-17 LAB — GLUCOSE, CAPILLARY
Glucose-Capillary: 126 mg/dL — ABNORMAL HIGH (ref 70–99)
Glucose-Capillary: 132 mg/dL — ABNORMAL HIGH (ref 70–99)

## 2018-07-17 MED ORDER — GERHARDT'S BUTT CREAM
TOPICAL_CREAM | Freq: Two times a day (BID) | CUTANEOUS | Status: DC
  Administered 2018-07-17 – 2018-07-31 (×30): via TOPICAL
  Administered 2018-08-01: 1 via TOPICAL
  Administered 2018-08-01 – 2018-08-05 (×9): via TOPICAL
  Administered 2018-08-06: 1 via TOPICAL
  Administered 2018-08-06: 08:00:00 via TOPICAL
  Administered 2018-08-07: 1 via TOPICAL
  Administered 2018-08-07 – 2018-08-15 (×16): via TOPICAL
  Filled 2018-07-17 (×3): qty 1

## 2018-07-17 NOTE — Evaluation (Addendum)
Speech Language Pathology Assessment and Plan  Patient Details  Name: Evan [name] MRN: [id number] Date of Birth: Apr 09, 1927  SLP Diagnosis: Cognitive Impairments;Dysphagia  Rehab Potential: Fair ELOS: 2-3 weeks    Today's Date: 07/17/2018 SLP Individual Time: 0900-1000 SLP Individual Time Calculation (min): 60 min   Problem List:  Patient Active Problem List   Diagnosis Date Noted  . History of lung cancer   . Depression   . CKD (chronic kidney disease), stage III ([place])   . Acute blood loss anemia   . Fall 07/16/2018  . 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 Acute spontaneous 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 ([place]) L thalamic 07/14/2018  . Cellulitis of arm, right 06/01/2018  . Rash and nonspecific skin eruption 06/01/2018  . Chest pain 02/02/2018  . Thrombocytopenia ([place]) 02/02/2018  . Hypokalemia 02/02/2018  . Pulmonary embolism ([place]) 02/02/2018  . Elevated troponin 02/02/2018  . Pulmonary embolus ([place]) 02/02/2018  . Non-ST elevation MI (NSTEMI) ([place]) 06/27/2017  . Diastolic CHF ([place]) 01/08/1218  . Chest pain due to CAD ([place]) 06/26/2017  . Dyspnea 06/21/2017  . Acute bronchitis 05/15/2017  . Oral candidiasis 05/15/2017  . Acute pericarditis   . Chest pain at rest 12/20/2016  . Angina pectoris ([place]) 12/20/2016  . Radiation pneumonitis ([place]) 11/09/2016  . Pleurisy 10/11/2016  . Chest wall pain 10/11/2016  . Atrial fibrillation ([place]) 05/21/2016  . Adenocarcinoma of right lung ([place]) 01/06/2016  . GAD (generalized anxiety disorder) 11/20/2015  . Essential hypertension   . Cellulitis 09/04/2015  . Cellulitis of left axilla   . Esophageal ulcer without bleeding   . Bilateral lower extremity edema 04/28/2014  . BPH (benign prostatic hyperplasia) 10/08/2012  . Anxiety   . EKG abnormalities 09/05/2012  . Tremor 09/05/2012  . Chronic fatigue 09/05/2012  . Arthritis   . Asthma, chronic   .  Reflux esophagitis   . S/P CABG (coronary artery bypass graft)   . GERD (gastroesophageal reflux disease)   . Hyperlipidemia   . Hypertension   . UNSPECIFIED PERIPHERAL VASCULAR DISEASE 06/16/2009   Past Medical History:  Past Medical History:  Diagnosis Date  . Adenocarcinoma of right lung ([place]) 01/06/2016  . Anginal chest pain at rest [facility])    Chronic non, controlled on Lorazepam  . Anxiety   . Arthritis    "all over"   . BPH (benign prostatic hyperplasia)   . CAD (coronary artery disease)   . Chronic lower back pain   . Complication of anesthesia    "problems making water afterwards"  . Depression   . DJD (degenerative joint disease)   . Esophageal ulcer without bleeding    bid ppi indefinitely  . Family history of adverse reaction to anesthesia    "children w/PONV"  . GERD (gastroesophageal reflux disease)   . Headache    "couple/week maybe" (09/04/2015)  . Hemochromatosis    Possible, elevated iron stores, hook-like osteophytes on hand films, normal LFTs  . Hepatitis    "yellow jaundice as a baby"  . History of ASCVD    MULTIVESSEL  . Hyperlipidemia   . Hypertension   . Myocardial infarction ([place]) 1999  . Osteoarthritis   . Pneumonia    "several times; got a little now" (09/04/2015)  . Rheumatoid arthritis ([place])   . Subdural hematoma ([facility])    small after fall 09/2016-plavix held, neurosurgery consulted.  Asymptomatic.    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 ARM SKIN LESION BIOPSY / EXCISION Left 09/02/2015  . CATARACT EXTRACTION W/ INTRAOCULAR LENS  IMPLANT, BILATERAL Bilateral   . CHOLECYSTECTOMY OPEN    . CORNEAL TRANSPLANT Bilateral    "one at [facility]; one at [facility]"  . CORONARY ANGIOPLASTY WITH STENT PLACEMENT    . CORONARY ARTERY BYPASS GRAFT  1999  . DESCENDING AORTIC ANEURYSM REPAIR W/ STENT     DE stent ostium into the right radial free  graft at OM-, 02-2007  . ESOPHAGOGASTRODUODENOSCOPY N/A 11/09/2014   Procedure: ESOPHAGOGASTRODUODENOSCOPY (EGD);  Surgeon: [name], MD;  Location: [facility] ENDOSCOPY;  Service: Endoscopy;  Laterality: N/A;  . INGUINAL HERNIA REPAIR    . JOINT REPLACEMENT    . LEFT HEART CATH AND CORS/GRAFTS ANGIOGRAPHY N/A 06/27/2017   Procedure: LEFT HEART CATH AND CORS/GRAFTS ANGIOGRAPHY;  Surgeon: [name], MD;  Location: [facility] LAB;  Service: Cardiovascular;  Laterality: N/A;  . NASAL SINUS SURGERY    . SHOULDER OPEN ROTATOR CUFF REPAIR Right   . TOTAL KNEE ARTHROPLASTY Bilateral     Assessment / Plan / Recommendation Clinical Impression Cognitive Linguistic evaluation was completed utilizing the COGNISTAT. Of note, pt is significantly hard of hearing, which adversely affects his ability to communicate effectively. Pt scored moderate-severely impaired across subtests - difficulty with orientation to time, digit repetition, constructional ability, immediate and delayed recall, mathmatical calculations, and reasoning (similarities and judgment) were noted. Pt had difficulty following multistep commands, likely due to poor hearing acuity, decreased attention, and poor recall, rather than language deficits. Sentence level repetition was also impaired, but likely due to impaired hearing. Pt reports he has a 7th grade education. His sister and daughter live with him. Prior to admit, pt's daughter managed all finances and medication administration.  Pt's sister or daughter did the shopping, cooking, and cleaning. When asked about a typical day for him prior to admit, pt reported that he "sat around, went for walks, or watched TV".    Dysphagia: Bedside swallow evaluation completed as well this session. Pt presents in similar fashion to BSE completed on acute care. Diet was changed to dys2 (finely chopped) and thin liquids due to impulsivity and tendency to pocket food. Today, pt exhibited right anterior leakage  after trials of thin liquid, with no awareness. No overt s/s aspiration observed on thin liquid or puree consistencies. Safe swallow precautions posted at [facility]. ST will follow for assessment of diet tolerance, education, and to determine readiness to advance solids.   Skilled Therapeutic Interventions          Cognitive-linguistic evaluation and Bedside swallow evaluation    SLP Assessment  Patient will need skilled Speech Language Pathology Services during CIR admission    Recommendations  SLP Diet Recommendations: Dysphagia 2 (Fine chop);Thin Liquid Administration via: Straw Medication Administration: Whole meds with liquid Supervision: Patient able to self feed;Staff to assist with self feeding;Full supervision/cueing for compensatory strategies Compensations: Minimize environmental distractions;Slow rate;Small sips/bites Postural Changes and/or Swallow Maneuvers: Seated upright 90 degrees;Upright 30-60 min after meal;Out of bed for meals Oral Care Recommendations: Oral care before and after PO Patient destination: Home Follow up Recommendations: (tbd) Equipment Recommended: None recommended by SLP    SLP Frequency 3 to 5 out of 7 days   SLP Duration  SLP Intensity  SLP Treatment/Interventions 2-3 weeks  Minumum of 1-2 x/day, 30 to 90 minutes  Cognitive remediation/compensation;Multimodal communication approach;Functional  tasks;Therapeutic Activities;Patient/family education    Pain Pain Assessment Pain Scale: 0-10 Pain Score: 0-No pain  Prior Functioning Cognitive/Linguistic Baseline: Information not available Type of Home: House  Lives With: Daughter;Family(sister) Available Help at Discharge: Family;Available 24 hours/day Education: 7th grade per pt 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 SLP Short Term Goal 2 (Week 1): Pt will  complete basic problem solving/thought organization/ reasoning tasks with min assist given verbal cues  SLP Short Term Goal 3 (Week 1): Pt will tolerate dysphagia 2 diet and thin liquids without overt s/s aspiration or decline in respiratory status. SLP Short Term Goal 4 (Week 1): Pt will verbalize safe swallow strategies for minimizing aspiration risk with min cues  Refer to Care Plan for Long Term Goals  Recommendations for other services: None   Discharge Criteria: Patient will be discharged from SLP if patient refuses treatment 3 consecutive times without medical reason, if treatment goals not met, if there is a change in medical status, if patient makes no progress towards goals or if patient is discharged from hospital.  The above assessment, treatment plan, treatment alternatives and goals were discussed and mutually agreed upon: No family available/patient unable  Celia B. Quentin Ore, [facility], CCC-SLP Speech Language Pathologist  [place], [place] 07/17/2018, 3:46 PM

## 2018-07-17 NOTE — Progress Notes (Signed)
Inpatient Rehabilitation  Patient information reviewed and entered into eRehab system by Kyandre Okray M. Mica Releford, M.A., CCC/SLP, PPS Coordinator.  Information including medical coding, functional ability and quality indicators will be reviewed and updated through discharge.    

## 2018-07-17 NOTE — Progress Notes (Addendum)
Received a call from Doree Barthel after her discussion with Dr. Leonie Man. To repeat CT head in one week and if improving to start low dose ASA. NO additional anticoagulation --he will need to follow up with Neurology  6 weeks post hemorrhage to help make decision regarding resumption of anticoagulation.    Daughter called for input--she was updated on above recommendations. She reports patient is asking to go home. We discussed his current functional status as well as reasoning for CIR--needs max assist to stand, debility with recent hospital stay. Nurse to face time with family later this afternoon to help provide psychological support.

## 2018-07-17 NOTE — Progress Notes (Signed)
2309 checked on pt- pt was flushed red and slight diaphoresis. Pt denied any pain pt was SOB with pursed lip breathing Lungs were clear to auscultation and diminished throughout bilateral lung fields. Pt's VSS taken B/P was low but pt has had low B/P in past on occasion.  Heat turned down and pt was covered with just the top sheet. Pt was incontinent of large amount of soft stool. Pt's buttocks was red. Cleansed and barrier cream applied. Pt positioned on his side. Pt is not as flushed skin warm and dry. Will monitor pt.

## 2018-07-17 NOTE — Evaluation (Addendum)
Physical Therapy Assessment and Plan  Patient Details  Name: Evan [name] MRN: [id number] Date of Birth: March 27, 1928  PT Diagnosis: Cognitive deficits, Coordination disorder, Difficulty walking, Hemiplegia dominant, Impaired cognition, Impaired sensation and Muscle weakness Rehab Potential: Good ELOS: 24-28 days   Today's Date: 07/17/2018 PT Individual Time: 0800-0900 PT Individual Time Calculation (min): 60 min    Problem List:  Patient Active Problem List   Diagnosis Date Noted  . History of lung cancer   . Depression   . CKD (chronic kidney disease), stage III ([place])   . Acute blood loss anemia   . Fall 07/16/2018  . 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 Acute spontaneous 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 ([place]) L thalamic 07/14/2018  . Cellulitis of arm, right 06/01/2018  . Rash and nonspecific skin eruption 06/01/2018  . Chest pain 02/02/2018  . Thrombocytopenia ([place]) 02/02/2018  . Hypokalemia 02/02/2018  . Pulmonary embolism ([place]) 02/02/2018  . Elevated troponin 02/02/2018  . Pulmonary embolus ([place]) 02/02/2018  . Non-ST elevation MI (NSTEMI) ([place]) 06/27/2017  . Diastolic CHF ([place]) 56/81/2751  . Chest pain due to CAD ([place]) 06/26/2017  . Dyspnea 06/21/2017  . Acute bronchitis 05/15/2017  . Oral candidiasis 05/15/2017  . Acute pericarditis   . Chest pain at rest 12/20/2016  . Angina pectoris ([place]) 12/20/2016  . Radiation pneumonitis ([place]) 11/09/2016  . Pleurisy 10/11/2016  . Chest wall pain 10/11/2016  . Atrial fibrillation ([place]) 05/21/2016  . Adenocarcinoma of right lung ([place]) 01/06/2016  . GAD (generalized anxiety disorder) 11/20/2015  . Essential hypertension   . Cellulitis 09/04/2015  . Cellulitis of left axilla   . Esophageal ulcer without bleeding   . Bilateral lower extremity edema 04/28/2014  . BPH (benign prostatic hyperplasia) 10/08/2012  . Anxiety   . EKG abnormalities  09/05/2012  . Tremor 09/05/2012  . Chronic fatigue 09/05/2012  . Arthritis   . Asthma, chronic   . Reflux esophagitis   . S/P CABG (coronary artery bypass graft)   . GERD (gastroesophageal reflux disease)   . Hyperlipidemia   . Hypertension   . UNSPECIFIED PERIPHERAL VASCULAR DISEASE 06/16/2009    Past Medical History:  Past Medical History:  Diagnosis Date  . Adenocarcinoma of right lung ([place]) 01/06/2016  . Anginal chest pain at rest [facility])    Chronic non, controlled on Lorazepam  . Anxiety   . Arthritis    "all over"   . BPH (benign prostatic hyperplasia)   . CAD (coronary artery disease)   . Chronic lower back pain   . Complication of anesthesia    "problems making water afterwards"  . Depression   . DJD (degenerative joint disease)   . Esophageal ulcer without bleeding    bid ppi indefinitely  . Family history of adverse reaction to anesthesia    "children w/PONV"  . GERD (gastroesophageal reflux disease)   . Headache    "couple/week maybe" (09/04/2015)  . Hemochromatosis    Possible, elevated iron stores, hook-like osteophytes on hand films, normal LFTs  . Hepatitis    "yellow jaundice as a baby"  . History of ASCVD    MULTIVESSEL  . Hyperlipidemia   . Hypertension   . Myocardial infarction ([place]) 1999  . Osteoarthritis   . Pneumonia    "several times; got a little now" (09/04/2015)  . Rheumatoid arthritis ([place])   . Subdural hematoma ([facility])    small after fall 09/2016-plavix held, neurosurgery consulted.  Asymptomatic.    Marland Kitchen  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[place]) 02/2018   on Lovenox lifelong since he failed PO Eliquis   Past Surgical History:  Past Surgical History:  Procedure Laterality Date  . ARM SKIN LESION BIOPSY / EXCISION Left 09/02/2015  . CATARACT EXTRACTION W/ INTRAOCULAR LENS  IMPLANT, BILATERAL Bilateral   . CHOLECYSTECTOMY OPEN    . CORNEAL TRANSPLANT Bilateral    "one at [facility]; one at [facility]"  . CORONARY ANGIOPLASTY WITH STENT PLACEMENT    . CORONARY ARTERY BYPASS  GRAFT  1999  . DESCENDING AORTIC ANEURYSM REPAIR W/ STENT     DE stent ostium into the right radial free graft at OM-, 02-2007  . ESOPHAGOGASTRODUODENOSCOPY N/A 11/09/2014   Procedure: ESOPHAGOGASTRODUODENOSCOPY (EGD);  Surgeon: [name], MD;  Location: [facility] ENDOSCOPY;  Service: Endoscopy;  Laterality: N/A;  . INGUINAL HERNIA REPAIR    . JOINT REPLACEMENT    . LEFT HEART CATH AND CORS/GRAFTS ANGIOGRAPHY N/A 06/27/2017   Procedure: LEFT HEART CATH AND CORS/GRAFTS ANGIOGRAPHY;  Surgeon: [name], MD;  Location: [place] CV LAB;  Service: Cardiovascular;  Laterality: N/A;  . NASAL SINUS SURGERY    . SHOULDER OPEN ROTATOR CUFF REPAIR Right   . TOTAL KNEE ARTHROPLASTY Bilateral     Assessment & Plan Clinical Impression: Patient is a 85 year old male with history of CAD, RA, RLL adenocarcinoma, possible radiation pneumonitis, A fib, DVT/PE--on treatment dose Lovenox, SDH 09/2016, recent admission for RUE hematoma/cellulitis, chronic thrombocytopenia, who was admitted on 07/14/18 after being found on the floor with difficulty moving right side. CT head done revealing acute left thalamic intraparenchymal hemorrhage felt to be likely hypertensive in setting of coagulopathy. Elevated blood pressure treated with Cleviprex and Lovenox DC'd.. Lovenox placed on hold and Dr. Lamonte Sakai recommended checking BLE Dopplers as well as IVC filter if significant lower extremity clot burden noticed. Dopplers revealed chronic leftperoneal vein DVT thereforeno need for IVC filter butPCCM recommending resuminganticoagulation when deemed safe. CTA head neck showed atheromatous change about carotid bifurcation with 50% narrowing right and 65% narrowing left and mildly enlarged right paratracheal adenopathy--indeterminate/question reactive. Maxillofacial CT showed soft tissue contusion with 3.4 cm hematoma right anterior face/neck. CT cervical spine was negative for injury and showed moderate cervical spondylosis C3-4  through C6-7 with moderate to severe spinal stenosis see 4/5. Follow-up CT head 4/12 showed stable left thalamic hemorrhage and neurology recommends holding all anticoagulation at this time. He was started on dysphagia 2 diet and currently noted to have significant cognitive impairment but could be affected by significant hearing loss.  Patient transferred to [facility] on 07/16/2018 .   Patient currently requires max with mobility secondary to muscle weakness, decreased cardiorespiratoy endurance, impaired timing and sequencing, unbalanced muscle activation, ataxia and decreased coordination, decreased attention to right, decreased initiation, decreased attention, decreased awareness, decreased problem solving, decreased safety awareness, decreased memory and delayed processing and decreased sitting balance, decreased standing balance, decreased postural control, hemiplegia and decreased balance strategies.  Prior to hospitalization, patient was independent  with mobility and lived with Daughter in a House home.  Home access is 4; also has ramp which was for his wife/deceasedStairs to enter.  Patient will benefit from skilled PT intervention to maximize safe functional mobility, minimize fall risk and decrease caregiver burden for planned discharge home with 24 hour assist.  Anticipate patient will benefit from follow up [facility] at discharge.  PT - End of Session Activity Tolerance: Tolerates < 10 min activity, no significant change in vital signs Endurance Deficit: Yes Endurance Deficit Description:  moderate increase in work of breathing w/ all activity, O2 sat 99% on 1 L/min O2 throughout session PT Assessment Rehab Potential (ACUTE/IP ONLY): Good PT Barriers to Discharge: Medical stability PT Patient demonstrates impairments in the following area(s): Balance;Behavior;Endurance;Motor;Perception;Safety;Sensory PT Transfers Functional Problem(s): Bed Mobility;Bed to Chair;Car;Furniture;Floor PT Locomotion  Functional Problem(s): Wheelchair Mobility;Ambulation;Stairs PT Plan PT Intensity: Minimum of 1-2 x/day ,45 to 90 minutes PT Frequency: 5 out of 7 days PT Duration Estimated Length of Stay: 24-28 days PT Treatment/Interventions: Ambulation/gait training;Cognitive remediation/compensation;Discharge planning;DME/adaptive equipment instruction;Functional mobility training;Psychosocial support;Pain management;Therapeutic Activities;Splinting/orthotics;UE/LE Strength taining/ROM;Visual/perceptual remediation/compensation;UE/LE Coordination activities;Therapeutic Exercise;Stair training;Wheelchair propulsion/positioning;Skin care/wound management;Neuromuscular re-education;Patient/family education;Disease management/prevention;Community reintegration;Balance/vestibular training PT Transfers Anticipated Outcome(s): min assist PT Locomotion Anticipated Outcome(s): min assist short distance gait PT Recommendation Follow Up Recommendations: Home health PT Patient destination: Home Equipment Recommended: To be determined  Skilled Therapeutic Intervention  Pt in supine and agreeable to therapy, denies pain. Requesting assistance w/ sitting up, pt w/ moderate increase in work of breathing in flat supine position. Roll R/L w/ mod assist to change brief as it had been soiled of urine. Supine<>sit w/ mod assist. Able to maintain static sitting balance w/ close supervision for brief periods of time. Stand pivot transfer to w/c w/ max assist needed for sequencing, blocking of R knee, and boosting. Sat up in w/c to eat breakfast w/ set-up assist, min cues to attend to R environment as pt attempting to use RUE and knocking it against table, and verbal/visual cues for 1-handed technique. Sit<>stands to RW from w/c, max assist w/ manual assist to boost and tactile cues for upright posture and to bring hips forward. Total assist for RUE management on RW. Attempted to take a few steps, however ultimately unable 2/2 safety at  this time. Instructed pt in results of PT evaluation as detailed below, PT POC, rehab potential, rehab goals, and discharge recommendations. Additionally discussed CIR's policies regarding fall safety and use of chair alarm and/or quick release belt. Pt verbalized understanding and in agreement. Ended session in w/c, all needs in reach.  PT Evaluation Precautions/Restrictions Precautions Precautions: Fall Precaution Comments: bruising along R side of neck Restrictions Weight Bearing Restrictions: No Pain Pain Assessment Pain Scale: 0-10 Pain Score: 0-No pain Home Living/Prior Functioning Home Living Available Help at Discharge: Family;Available 24 hours/day Type of Home: House Home Access: Stairs to enter [employer] of Steps: 4; also has ramp which was for his wife/deceased Home Layout: Two level;Laundry or work area in basement;Able to live on main level with bedroom/bathroom Bathroom Shower/Tub: Multimedia programmer: Handicapped height Bathroom Accessibility: Yes Additional Comments: daughter confirms home set up . one level with laundry in the basement  Lives With: Daughter Prior Function Level of Independence: Independent with basic ADLs;Independent with gait;Independent with homemaking with ambulation;Independent with transfers  Able to Take Stairs?: Yes Driving: Yes Vocation: Retired Comments: Per chart review of conversations w/ daughter, pt was driving and independent w/ ADLs PTA Vision/Perception  Vision - Assessment Additional Comments: Pt able to accurately state time on clock w/o cues.  Perception Perception: Impaired Inattention/Neglect: Does not attend to right side of body Praxis Praxis: Impaired Praxis Impairment Details: Initiation;Motor planning Praxis-Other Comments: mild motor planning/initiation impairments observed  Cognition Overall Cognitive Status: Impaired/Different from baseline Arousal/Alertness: Awake/alert Orientation  Level: Oriented to person;Oriented to place;Oriented to situation(stated the president was Shon Millet and it was 2001) Sustained Attention: Impaired Memory: Impaired Awareness: Impaired Problem Solving: Impaired Safety/Judgment: Impaired Comments: decreased awareness of deficits Sensation Sensation Light Touch: Impaired by  gross assessment(absent R side) Coordination Gross Motor Movements are Fluid and Coordinated: No Coordination and Movement Description: R side impaired 2/2 proprioceptive and sensation deficits, needed max cues to attend to where his R side was Heel Shin Test: Impaired RLE Motor  Motor Motor: Hemiplegia;Ataxia Motor - Skilled Clinical Observations: Mild R hemi  Mobility Bed Mobility Bed Mobility: Rolling Right;Rolling Left;Supine to Sit;Sit to Supine Rolling Right: Moderate Assistance - Patient 50-74% Rolling Left: Moderate Assistance - Patient 50-74% Supine to Sit: Moderate Assistance - Patient 50-74% Sit to Supine: Moderate Assistance - Patient 50-74% Transfers Transfers: Sit to Stand;Stand to Sit;Stand Pivot Transfers Sit to Stand: Maximal Assistance - Patient 25-49% Stand to Sit: Maximal Assistance - Patient 25-49% Stand Pivot Transfers: Maximal Assistance - Patient 25 - 49% Stand Pivot Transfer Details: Manual facilitation for weight shifting;Manual facilitation for placement;Tactile cues for posture;Tactile cues for placement;Tactile cues for initiation;Verbal cues for technique;Verbal cues for sequencing Transfer (Assistive device): None Locomotion  Gait Ambulation: No Gait Gait: No Stairs / Additional Locomotion Stairs: No Wheelchair Mobility Wheelchair Mobility: No  Trunk/Postural Assessment  Cervical Assessment Cervical Assessment: Exceptions to WFL(forward head, rounded shoulders posture) Thoracic Assessment Thoracic Assessment: Within Functional Limits Lumbar Assessment Lumbar Assessment: Exceptions to WFL(posterior pelvic  tilt) Postural Control Postural Control: Deficits on evaluation(impaired midline orientation, delayed righting reactions)  Balance Balance Balance Assessed: Yes Static Sitting Balance Static Sitting - Balance Support: No upper extremity supported;Feet supported Static Sitting - Level of Assistance: 5: Stand by assistance Dynamic Sitting Balance Dynamic Sitting - Balance Support: No upper extremity supported;Feet supported Dynamic Sitting - Level of Assistance: 4: Min assist Extremity Assessment  RLE Assessment RLE Assessment: Exceptions to [facility] Passive Range of Motion (PROM) Comments: WFL General Strength Comments: globally 3+ to 4/5 LLE Assessment LLE Assessment: Within Functional Limits    Refer to Care Plan for Long Term Goals  Recommendations for other services: None   Discharge Criteria: Patient will be discharged from PT if patient refuses treatment 3 consecutive times without medical reason, if treatment goals not met, if there is a change in medical status, if patient makes no progress towards goals or if patient is discharged from hospital.  The above assessment, treatment plan, treatment alternatives and goals were discussed and mutually agreed upon: by patient  Azaryah Oleksy K Harjit Leider 07/17/2018, 9:47 AM

## 2018-07-17 NOTE — IPOC Note (Signed)
Overall Plan of Care [facility]) Patient Details Name: Evan [name] MRN: [id number] DOB: 06-30-1927  Admitting Diagnosis: Left thalamic hemorrhage.   Hospital Problems: Active Problems:   Thalamic hemorrhage (HCC)   History of lung cancer   Depression   CKD (chronic kidney disease), stage III (HCC)   Acute blood loss anemia     Functional Problem List: Nursing Bladder, Bowel, Motor, Pain, Safety, Skin Integrity  PT Balance, Behavior, Endurance, Motor, Perception, Safety, Sensory  OT Balance, Cognition, Endurance, Motor, Perception, Safety, Vision, Sensory  SLP Cognition, Safety, Motor  TR         Basic ADL's: OT Eating, Bathing, Grooming, Dressing, Toileting     Advanced  ADL's: OT       Transfers: PT Bed Mobility, Bed to Chair, Car, [employer], Floor  OT Toilet, Tub/Shower     Locomotion: PT Emergency planning/management officer, Ambulation, Stairs     Additional Impairments: OT Fuctional Use of Upper Extremity  SLP Social Cognition, Swallowing   Social Interaction, Problem Solving, Memory, Attention  TR      Anticipated Outcomes Item Anticipated Outcome  Self Feeding Supervision  Swallowing  mechanical soft, thin liquids   Basic self-care  Min A  Toileting  Min A   Bathroom Transfers Min A  Bowel/Bladder  patient to become continent of bowel and bladder during admission  Transfers  min assist  Locomotion  min assist short distance gait  Communication     Cognition  supervision  Pain  patient to be pain free or pain less than 3 during admission  Safety/Judgment  patient will free from falls and adhere to safey plan   Therapy Plan: PT Intensity: Minimum of 1-2 x/day ,45 to 90 minutes PT Frequency: 5 out of 7 days PT Duration Estimated Length of Stay: 24-28 days OT Intensity: Minimum of 1-2 x/day, 45 to 90 minutes OT Frequency: 5 out of 7 days OT Duration/Estimated Length of Stay: 25-28 days SLP Intensity: Minumum of 1-2 x/day, 30 to 90 minutes SLP Frequency: 3 to  5 out of 7 days SLP Duration/Estimated Length of Stay: 2-3 weeks    Team Interventions: Nursing Interventions Skin Care/Wound Management, Bladder Management, Disease Management/Prevention, Patient/Family Education  PT interventions Ambulation/gait training, Cognitive remediation/compensation, Discharge planning, DME/adaptive equipment instruction, Functional mobility training, Psychosocial support, Pain management, Therapeutic Activities, Splinting/orthotics, UE/LE Strength taining/ROM, Visual/perceptual remediation/compensation, UE/LE Coordination activities, Therapeutic Exercise, Stair training, Wheelchair propulsion/positioning, Skin care/wound management, Neuromuscular re-education, Patient/family education, Disease management/prevention, Academic librarian, Training and development officer  OT Interventions Training and development officer, Cognitive remediation/compensation, Academic librarian, Discharge planning, Disease mangement/prevention, Engineer, drilling, Functional electrical stimulation, Functional mobility training, Neuromuscular re-education, Patient/family education, Self Care/advanced ADL retraining, Therapeutic Activities, Therapeutic Exercise, UE/LE Strength taining/ROM, UE/LE Coordination activities, Wheelchair propulsion/positioning, Visual/perceptual remediation/compensation  SLP Interventions Cognitive remediation/compensation, Multimodal communication approach, Functional tasks, Therapeutic Activities, Patient/family education  TR Interventions    SW/CM Interventions Discharge Planning, Patient/Family Education, Psychosocial Support   Barriers to Discharge MD  Medical stability and [facility]  Nursing      PT Medical stability    OT      SLP   poor hearing acuity  SW       Team Discharge Planning: Destination: PT-Home ,OT- Home , SLP-Home Projected Follow-up: PT-Home health PT, OT-  Home health OT, SLP-(tbd) Projected Equipment Needs: PT-To be  determined, OT- To be determined, SLP-None recommended by SLP Equipment Details: PT- , OT-  Patient/family involved in discharge planning: PT- Patient,  OT-Patient, SLP-Patient  MD ELOS: 20-24 days. Medical Rehab Prognosis:  Good Assessment: 85 year old male with history of CAD, RA, RLL adenocarcinoma, possible radiation pneumonitis, A fib, DVT/PE--on treatment dose Lovenox, SDH 09/2016, recent admission for RUE hematoma/cellulitis, chronic thrombocytopenia, who was admitted on 07/14/18 after being found on the floor with difficulty moving right side. CT head done revealing acute left thalamic intraparenchymal hemorrhage felt to be likely hypertensive in setting of coagulopathy. Elevated blood pressure treated with Cleviprex and Lovenox DC'd. Lovenox placed on hold and Dr. Lamonte Sakai recommended checking BLE Dopplers as well as IVC filter if significant lower extremity clot burden noticed. Dopplers revealed chronic leftperoneal vein DVT thereforeno need for IVC filter butPCCM recommending resuminganticoagulation when deemed safe. CTA head neck showed atheromatous change about carotid bifurcation with 50% narrowing right and 65% narrowing left and mildly enlarged right paratracheal adenopathy--indeterminate/question reactive. Maxillofacial CT showed soft tissue contusion with 3.4 cm hematoma right anterior face/neck. CT cervical spine was negative for injury and showed moderate cervical spondylosis C3-4 through C6-7 with moderate to severe spinal stenosis. Follow-up CT head 4/12 showed stable left thalamic hemorrhage and neurology recommends holding all anticoagulation at this time. He was started on dysphagia 2 diet and currently noted to have significant cognitive impairment but could be affected by significant hearing loss. Will set goals for Min A with PT/OT and Supervision with SLP.  Due to the current state of emergency, patients may not be receiving their 3-hours of Medicare-mandated  therapy.  See Team Conference Notes for weekly updates to the plan of care

## 2018-07-17 NOTE — Progress Notes (Signed)
Physical Therapy Session Note  Patient Details  Name: Evan [name] MRN: [id number] Date of Birth: July 04, 1927  Today's Date: 07/17/2018 PT Individual Time: 1130-1200 PT Individual Time Calculation (min): 30 min   Short Term Goals: Week 1:  PT Short Term Goal 1 (Week 1): Pt will initiate gait training PT Short Term Goal 2 (Week 1): Pt will initiate w/c mobility training PT Short Term Goal 3 (Week 1): Pt will transfer bed<>chair w/ mod assist PT Short Term Goal 4 (Week 1): Pt will attend to R side during functional mobility w/o cues 50% of the time  Skilled Therapeutic Interventions/Progress Updates:   Patient received in bed, asleep but easily woken and very HOH today requiring visual demonstration and simple hand signals for effective communication this session. Able to complete bed mobility with modA, once up at EOB attempted sit to stand with maxA however patient very fatigued and unsteady with assist of just +1 so returned to sitting position. Focused majority of session on sitting balance tasks including ipsilateral and cross-midline reaching, max cues to correct posterior and R lean as patient without awareness of when he is losing balance/drifting away from midline. Required modA for return to supine and was positioned to comfort with all needs met this morning.   Therapy Documentation Precautions:  Precautions Precautions: Fall Precaution Comments: bruising along R side of neck Restrictions Weight Bearing Restrictions: No General:   Vital Signs:  Pain: Pain Assessment Pain Scale: 0-10 Pain Score: 0-No pain     Therapy/Group: Individual Therapy   Deniece Ree PT, DPT, CBIS  Supplemental Physical Therapist [facility]    Pager [phone] Acute Rehab Office [phone]    07/17/2018, 1:15 PM

## 2018-07-17 NOTE — Progress Notes (Signed)
[place] PHYSICAL MEDICINE & REHABILITATION PROGRESS NOTE  Subjective/Complaints: Patient seen laying in bed this morning.  He slept a limited amount per sleep chart.  Patient extremely hard of hearing.  Overnight patient noted to be flushed and diaphoretic per nursing, this appeared to improve after patient had a bowel movement.  ROS: Denies CP, shortness of breath, nausea, vomiting, diarrhea.  Objective: Vital Signs: Blood pressure (!) 131/56, pulse 84, temperature 98.6 F (37 C), temperature source Oral, resp. rate 20, height 5\' 5"  (1.651 m), weight 79.8 kg, SpO2 97 %. Dg Chest 2 View  Result Date: 07/16/2018 CLINICAL DATA:  Recent fall with shortness of breath and cough, initial encounter EXAM: CHEST - 2 VIEW COMPARISON:  CT from 06/01/2018 FINDINGS: Cardiac shadow remains enlarged. Aortic calcifications are again seen. Postsurgical changes are noted. Mild scarring is noted in the bases bilaterally. No focal confluent infiltrate or sizable effusion is noted. 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 No new focal abnormality is seen. IMPRESSION: No acute abnormality noted. Electronically Signed   By: Inez Catalina M.D.   On: 07/16/2018 19:49   Recent Labs    07/15/18 0437 07/16/18 0542  WBC 7.2 8.6  HGB 11.2* 11.8*  HCT 34.3* 35.3*  PLT 132* 129*   Recent Labs    07/15/18 0437 07/16/18 0542  NA 136 136  K 4.0 4.2  CL 106 102  CO2 20* 21*  GLUCOSE 106* 127*  BUN 18 18  CREATININE 1.61* 1.44*  CALCIUM 8.5* 9.0    Physical Exam: BP (!) 131/56 (BP Location: Left Arm)   Pulse 84   Temp 98.6 F (37 C) (Oral)   Resp 20   Ht 5\' 5"  (1.651 m)   Wt 79.8 kg   SpO2 97%   BMI 29.29 kg/m  Constitutional: No distress . Vital signs reviewed. HENT: Normocephalic.  Atraumatic. Eyes: EOMI. No discharge. Cardiovascular: No JVD. Respiratory: Normal effort. GI: Non-distended. Musc: No edema or tenderness in extremities. Neurological:  He  isalert. Extremely HOH.  Motor: LUE/LLE: 5/5 proximal distal RUE: Shoulder abduction 3+/5, elbow flexion/extension 3/5, handgrip 1/5 Right lower extremity: Hip flexion, knee extension 2+/5, ankle dorsiflexion 2/5 Skin: Right neck with ecchymosis and hard indurated area under chin.  Psychiatric: Limited assessment due to [facility]  Assessment/Plan: 1. 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  Physiatrist is providing close team supervision and 24 hour management of active medical problems listed below.  Physiatrist and rehab team continue to assess barriers to discharge/monitor patient progress toward functional and medical goals  Care Tool:  Bathing              Bathing assist       Upper Body Dressing/Undressing Upper body dressing Upper body dressing/undressing activity did not occur (including orthotics): N/A      Upper body assist      Lower Body Dressing/Undressing Lower body dressing    Lower body dressing activity did not occur: N/A       Lower body assist       Toileting Toileting Toileting Activity did not occur (Clothing management and hygiene only): N/A (no void or bm)  Toileting assist Assist for toileting: 2 Helpers     Transfers Chair/bed transfer  Transfers assist  Chair/bed transfer activity did not occur: N/A        Locomotion Ambulation   Ambulation assist  Walk 10 feet activity   Assist           Walk 50 feet activity   Assist           Walk 150 feet activity   Assist           Walk 10 feet on uneven surface  activity   Assist           Wheelchair     Assist               Wheelchair 50 feet with 2 turns activity    Assist            Wheelchair 150 feet activity     Assist            Medical Problem List and Plan: 1.Functional  and cognitive 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  Begin CIR   Notes reviewed- [facility] history of PE on anticoagulation, images reviewed- moderate left thalamic hemorrhage, labs reviewed 2. Antithrombotics: -PE/LLEDVT/anticoagulation:None due to [place] -antiplatelet therapy: N/A 3. Pain Management:tylenol prn. 4. Mood:LCSW to follow for evaluation and support. -antipsychotic agents: N/A. Continue ativan tid as at home and Prozac. 5. Neuropsych: This patientis not fullycapable of making decisions onhisown behalf. 6. Skin/Wound Care:Monitor right neck hematoma for resolution. 7. Fluids/Electrolytes/Nutrition:Monitor I/O.  8.Chronic thrombocytopenia/ ABLA: Continue to monitor H/H/Platelets with serial checks. Monitor for signs of bleeding.   Hemoglobin 11.8 on 4/13  Platelets 129 on 4/13  Continue to monitor 9. Right forearm wound/ recent cellulitis:On Keflex--question completed course per records. Added Aqacell to wound as per derm records. 10. CKD: Avoid nephrotoxic medications. Monitor with serial checks.   Creatinine 1.44 on 4/13  Continue to monitor 11. H/o A fib: Monitor HR bid. Continue metoprolol bid--off Lovenox.  12. H/o Depression: On Prozac daily with ativan tid.  8. H/o Lung cancer: Had PE on Eliquis. No antiplatelets or anticoagulation for now.              Scheduled and prn nebs   CXR reviewed, unremarkable for acute process  LOS: 1 days A FACE TO FACE EVALUATION WAS PERFORMED   Lorie Phenix 07/17/2018, 8:26 AM

## 2018-07-18 ENCOUNTER — Inpatient Hospital Stay (HOSPITAL_COMMUNITY): Payer: Medicare HMO | Admitting: Occupational Therapy

## 2018-07-18 ENCOUNTER — Inpatient Hospital Stay (HOSPITAL_COMMUNITY): Payer: Medicare HMO

## 2018-07-18 ENCOUNTER — Inpatient Hospital Stay (HOSPITAL_COMMUNITY): Payer: Medicare HMO | Admitting: Speech Pathology

## 2018-07-18 ENCOUNTER — Inpatient Hospital Stay (HOSPITAL_COMMUNITY): Payer: Medicare HMO | Admitting: Physical Therapy

## 2018-07-18 DIAGNOSIS — R0602 Shortness of breath: Secondary | ICD-10-CM

## 2018-07-18 DIAGNOSIS — R739 Hyperglycemia, unspecified: Secondary | ICD-10-CM

## 2018-07-18 DIAGNOSIS — R0989 Other specified symptoms and signs involving the circulatory and respiratory systems: Secondary | ICD-10-CM

## 2018-07-18 DIAGNOSIS — D72829 Elevated white blood cell count, unspecified: Secondary | ICD-10-CM

## 2018-07-18 LAB — URINALYSIS, COMPLETE (UACMP) WITH MICROSCOPIC
Bacteria, UA: NONE SEEN
Bilirubin Urine: NEGATIVE
Glucose, UA: NEGATIVE mg/dL
Hgb urine dipstick: NEGATIVE
Ketones, ur: NEGATIVE mg/dL
Leukocytes,Ua: NEGATIVE
Nitrite: NEGATIVE
Protein, ur: NEGATIVE mg/dL
Specific Gravity, Urine: 1.008 (ref 1.005–1.030)
pH: 7 (ref 5.0–8.0)

## 2018-07-18 LAB — GLUCOSE, CAPILLARY
Glucose-Capillary: 116 mg/dL — ABNORMAL HIGH (ref 70–99)
Glucose-Capillary: 106 mg/dL — ABNORMAL HIGH (ref 70–99)
Glucose-Capillary: 100 mg/dL — ABNORMAL HIGH (ref 70–99)

## 2018-07-18 NOTE — Progress Notes (Signed)
Occupational Therapy Session Note  Patient Details  Name: Evan [name] MRN: [id number] Date of Birth: 10-Aug-1927  Today's Date: 07/18/2018 OT Individual Time: 1135-1200 OT Individual Time Calculation (min): 25 min    Short Term Goals: Week 1:  OT Short Term Goal 1 (Week 1): Pt will complete toilet transfer with max A  OT Short Term Goal 2 (Week 1): Pt will complete sit<>stand with max A in preparation for BADL tasks OT Short Term Goal 3 (Week 1): Pt will tolerate sitting EOB for 4 mins to increase endurance for BADL tasks  Skilled Therapeutic Interventions/Progress Updates:    Pt received in bed and stated he needed the urinal.  Urinal positioned and pt was able to go, but his brief was slightly wet.  Pt did roll in bed with min A and cues and actively used legs to push hips up.  Pt had slid down in bed and needed to be repositioned.  Pt was able to follow directions with pulling up on bar with LUE to assist with getting positioned.   RUE PROM and A/arom for increased motor control.  Pt was able to grasp hands together and reach arms to ceiling for 15 reps with mod A to control movement of RUE.  Pt tolerated session well and resting in bed with all needs met.   Therapy Documentation Precautions:  Precautions Precautions: Fall Precaution Comments: bruising along R side of neck Restrictions Weight Bearing Restrictions: No     Pain: Pain Assessment Pain Score: 0-No pain   Therapy/Group: Individual Therapy  [place]on Mills 07/18/2018, 12:35 PM

## 2018-07-18 NOTE — Progress Notes (Signed)
Speech Language Pathology Daily Session Note  Patient Details  Name: Evan [name] MRN: [id number] Date of Birth: 1927/10/27  Today's Date: 07/18/2018 SLP Individual Time: 1030-1110 SLP Individual Time Calculation (min): 40 min  Short Term Goals: Week 1: SLP Short Term Goal 1 (Week 1): Pt will demonstrate orientation x4 with min verbal cues. SLP Short Term Goal 2 (Week 1): Pt will complete basic problem solving/thought organization/ reasoning tasks with min assist given verbal cues SLP Short Term Goal 3 (Week 1): Pt will tolerate dysphagia 2 diet and thin liquids without overt s/s aspiration or decline in respiratory status. SLP Short Term Goal 4 (Week 1): Pt will verbalize safe swallow strategies for minimizing aspiration risk with min cues  Skilled Therapeutic Interventions: Skilled treatment session focused on cognitive goals. SLP facilitated session by providing external aids to maximize recall and carryover of orientation to place and date. Patient required Max A verbal cues for utilization of external aids to recall date and place throughout session after a 5 minute delay. Patient independently requested to use the urinal and was continent of urine. Total A required to locate and recall reasons for utilization of call bell to request assistance. Patient left upright in bed with all needs within reach. Continue with current plan of care.      Pain No/Denies Pain   Therapy/Group: Individual Therapy  Shanesha Bednarz 07/18/2018, 12:20 PM

## 2018-07-18 NOTE — Progress Notes (Signed)
Pt has been saying that he will not make it until the morning. Pt had spoken to daughter and said the same thing. I spoke with the daughter at 59 after seeing the pt and told her that he is stable at this time and that he will be monitored throughout the evening. Pt c/o SOB and requesting breathing treatment. Pt was SOB at rest pulse ox 95% RR 19 lungs clear diminished throughout all fields. Breathing treatment done. Pt states he feels slightly better. Pt given reassurance that he will be checked on throughout the evening. Pt resting.  2300 spoke with the pt's son Seefeldt and updated. 2320 this evening pt called out that he was unable to breathe skin pink warm dry RR 22 SOB with exertion HR and POX within normal limits. Lungs remain clear but diminished. Pt insisting he will not live through the night. Pt given reassurance and emotional support. 02 [place] at 0.5 L/min applied per pt request. Pt calmer eyes closing but easily aroused. Will continue to monitor. Pt is resting presently arouses easily.

## 2018-07-18 NOTE — Progress Notes (Signed)
[place] PHYSICAL MEDICINE & REHABILITATION PROGRESS NOTE  Subjective/Complaints: Patient seen laying in bed this AM.  He slept well overnight per sleep chart.  He, however, states he did not sleep well.   ROS: +SOB. Denies CP, nausea, vomiting, diarrhea.  Objective: Vital Signs: Blood pressure (!) 141/59, pulse 76, temperature 97.8 F (36.6 C), resp. rate 19, height 5\' 5"  (1.651 m), weight 79.8 kg, SpO2 95 %. Dg Chest 2 View  Result Date: 07/16/2018 CLINICAL DATA:  Recent fall with shortness of breath and cough, initial encounter EXAM: CHEST - 2 VIEW COMPARISON:  CT from 06/01/2018 FINDINGS: Cardiac shadow remains enlarged. Aortic calcifications are again seen. Postsurgical changes are noted. Mild scarring is noted in the bases bilaterally. No focal confluent infiltrate or sizable effusion is noted. 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 No new focal abnormality is seen. IMPRESSION: No acute abnormality noted. Electronically Signed   By: Inez Catalina M.D.   On: 07/16/2018 19:49   Recent Labs    07/16/18 0542 07/17/18 1050  WBC 8.6 11.0*  HGB 11.8* 12.9*  HCT 35.3* 40.4  PLT 129* 131*   Recent Labs    07/16/18 0542 07/17/18 1050  NA 136 135  K 4.2 4.2  CL 102 101  CO2 21* 21*  GLUCOSE 127* 116*  BUN 18 21  CREATININE 1.44* 1.57*  CALCIUM 9.0 9.3    Physical Exam: BP (!) 141/59 (BP Location: Left Arm)   Pulse 76   Temp 97.8 F (36.6 C)   Resp 19   Ht 5\' 5"  (1.651 m)   Wt 79.8 kg   SpO2 95%   BMI 29.29 kg/m  Constitutional: No distress . Vital signs reviewed. HENT: Normocephalic. Atraumatic. Eyes: EOMI. No discharge. Cardiovascular: No JVD. Respiratory: Normal effort. +[place]. GI: Non-distended. Musc: No edema or tenderness in extremities. Neurological:  He isalert and oriented x2. Extremely HOH.  Motor:  RUE: Shoulder abduction 3+/5, elbow flexion/extension 3/5, handgrip 1/5 Right lower extremity: Hip flexion,  knee extension 2+/5, ankle dorsiflexion 2/5 Skin: Right neck with ecchymosis and hard indurated area under chin.  Psychiatric: Limited assessment due to [facility]  Assessment/Plan: 1. 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  Physiatrist is providing close team supervision and 24 hour management of active medical problems listed below.  Physiatrist and rehab team continue to assess barriers to discharge/monitor patient progress toward functional and medical goals  Care Tool:  Bathing    Body parts bathed by patient: Face   Body parts bathed by helper: Right arm, Left arm, Chest, Abdomen, Front perineal area, Buttocks, Right upper leg, Right lower leg, Left lower leg, Left upper leg     Bathing assist Assist Level: Total Assistance - Patient < 25%     Upper Body Dressing/Undressing Upper body dressing Upper body dressing/undressing activity did not occur (including orthotics): N/A What is the patient wearing?: Pull over shirt    Upper body assist Assist Level: Total Assistance - Patient < 25%    Lower Body Dressing/Undressing Lower body dressing    Lower body dressing activity did not occur: N/A What is the patient wearing?: Underwear/pull up, Pants     Lower body assist Assist for lower body dressing: Dependent - Patient 0%     Toileting Toileting Toileting Activity did not occur (Clothing management and hygiene only): Safety/medical concerns  Toileting assist Assist for toileting: 2 Helpers  Transfers Chair/bed transfer  Transfers assist  Chair/bed transfer activity did not occur: Safety/medical concerns  Chair/bed transfer assist level: Maximal Assistance - Patient 25 - 49%     Locomotion Ambulation   Ambulation assist   Ambulation activity did not occur: Safety/medical concerns          Walk 10 feet activity   Assist  Walk 10  feet activity did not occur: Safety/medical concerns        Walk 50 feet activity   Assist Walk 50 feet with 2 turns activity did not occur: Safety/medical concerns         Walk 150 feet activity   Assist Walk 150 feet activity did not occur: Safety/medical concerns         Walk 10 feet on uneven surface  activity   Assist Walk 10 feet on uneven surfaces activity did not occur: Safety/medical concerns         Wheelchair     Assist Will patient use wheelchair at discharge?: Yes             Wheelchair 50 feet with 2 turns activity    Assist            Wheelchair 150 feet activity     Assist            Medical Problem List and Plan: 1.Functional and cognitive 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  Cont CIR   Team conference today to discuss current and goals and coordination of care, home and environmental barriers, and discharge planning with nursing, case manager, and therapies.   Plan for CT ~4/21 and resume ASA (no other anticoagulation) if improving per Neuro 2. Antithrombotics: -PE/LLEDVT/anticoagulation:None due to [place] -antiplatelet therapy: N/A 3. Pain Management:tylenol prn. 4. Mood:LCSW to follow for evaluation and support. -antipsychotic agents: N/A. Continue ativan tid as at home and Prozac. 5. Neuropsych: This patientis not fullycapable of making decisions onhisown behalf. 6. Skin/Wound Care:Monitor right neck hematoma for resolution. 7. Fluids/Electrolytes/Nutrition:Monitor I/O.  8.Chronic thrombocytopenia/ ABLA: Continue to monitor H/H/Platelets with serial checks. Monitor for signs of bleeding.   Hemoglobin 12.9 on 4/14  Platelets 131 on 4/14  Continue to monitor 9. Right forearm wound/ recent cellulitis:On Keflex through 4/16--question completed course per records. Added Aqacell to wound as per derm records. 10.CKD stage III: Avoid nephrotoxic medications.  Monitor with serial checks.   Creatinine 1.57 on 4/14  Continue to monitor 11. H/o A fib: Monitor HR bid. Continue metoprolol bid--off Lovenox.  12. H/o Depression: On Prozac daily with ativan tid.  70. H/o Lung cancer: Had PE on Eliquis. No antiplatelets or anticoagulation for now.              Scheduled and prn nebs   CXR reviewed, unremarkable for acute process, will consider repeat if necessary 14. Labile blood pressure  Labile on 4/15 15. Hyperglycemia  Elevated on 4/15 16. Leukocytosis  WBCs 11.0 on 4/14  Afebrile  UA/Ucx ordered  LOS: 2 days A FACE TO FACE EVALUATION WAS PERFORMED  Jonasia Coiner Lorie Phenix 07/18/2018, 8:03 AM

## 2018-07-18 NOTE — Progress Notes (Signed)
Occupational Therapy Session Note  Patient Details  Name: Evan [name] MRN: [id number] Date of Birth: Aug 19, 1927  Today's Date: 07/18/2018 OT Individual Time:  -       Short Term Goals: Week 1:  OT Short Term Goal 1 (Week 1): Pt will complete toilet transfer with max A  OT Short Term Goal 2 (Week 1): Pt will complete sit<>stand with max A in preparation for BADL tasks OT Short Term Goal 3 (Week 1): Pt will tolerate sitting EOB for 4 mins to increase endurance for BADL tasks  Skilled Therapeutic Interventions/Progress Updates:    1:1. Pt with no report of pain. Pt on .25L O2 per pt request. Pt O2 remains >95% throughout session despite wheezing sound. Pt completes UB bathing with HOH A to maintain R grasp on washcloth while washing LUE with RUE for NMR. Pt demo moderate functional ROM in shoulder and elbow during tasks, but states he "cant move it at all." Pt dons shirt with max A (pt 30%) following hemi dressing techniques. Pt able to wash peri area and part of thighs (A for thoroughness). Total A to don pants however pt able to attain good butt lift off bed during glute bridge for clothing management over hips. Pt requires prolonged rest break after each activity d/t fatigue. Pt completes stedy transfer EOB<>w/c with +2 A getting out of bed and +1 returning to bed d/t R lean/sitting balance. Pt able to power up to stand with MOD A. Pt brushes teeth with HOH A to incorporate RUE into brushing teeth with MOD VC for sequencing. Exited session with pt seated inbed, exit alarm on and call light in reach  Therapy Documentation Precautions:  Precautions Precautions: Fall Precaution Comments: bruising along R side of neck Restrictions Weight Bearing Restrictions: No General:   Vital Signs: Therapy Vitals Temp: 97.8 F (36.6 C) Pulse Rate: 76 Resp: 19 BP: (!) 141/59 Patient Position (if appropriate): Lying Oxygen Therapy SpO2: 95 % O2 Device: Room Air Pain:   ADL: ADL Eating: Minimal  assistance Grooming: Maximal assistance Upper Body Bathing: Dependent Lower Body Bathing: Dependent Upper Body Dressing: Maximal assistance Lower Body Dressing: Dependent Toileting: Dependent Toilet Transfer: Unable to assess Tub/Shower Transfer: Unable to assess Vision   Perception    Praxis   Exercises:   Other Treatments:     Therapy/Group: Individual Therapy  Tonny Branch 07/18/2018, 8:43 AM

## 2018-07-18 NOTE — Progress Notes (Signed)
Physical Therapy Session Note  Patient Details  Name: Evan [name] MRN: [id number] Date of Birth: [birth year]  Today's Date: 07/18/2018 PT Individual Time: 1836-7255 PT Individual Time Calculation (min): 59 min   Short Term Goals: Week 1:  PT Short Term Goal 1 (Week 1): Pt will initiate gait training PT Short Term Goal 2 (Week 1): Pt will initiate w/c mobility training PT Short Term Goal 3 (Week 1): Pt will transfer bed<>chair w/ mod assist PT Short Term Goal 4 (Week 1): Pt will attend to R side during functional mobility w/o cues 50% of the time  Skilled Therapeutic Interventions/Progress Updates:   Pt in supine and agreeable to therapy, no c/o pain. Pt SOB at baseline this session, O2 sat at 99% on 0.25 L/min supplemental O2. Mod assist supine>sit. Donned pants w/ total assist at EOB. Transfer to w/c in stedy. Max-total assist to maintain upright posture, pt w/ heavy R lateral lean once in standing. Total assist w/c transport to/from therapy gym. Worked on sit<>stands in stedy and midline orientation this session. Sit<>stands in stedy w/ min-mod assist to boost into standing, used mirror visual feedback for midline orientation w/ improved success vs in room w/o visual feedback. Performed blocked practice of sit<>stands in stedy, 10+ reps. Pt continued to have SOB, however was @ 99-100% O2 on room air. Maintained nasal cannula in pt's nostrils for his own confidence for breathing. Per pt, his SOB is mostly baseline and he would use O2 occasionally. Verbal cues throughout session for diaphragmatic breathing and slower breathing rate. Returned to room and stedy transfer to recliner. Assisted w/ positioning in recliner for OOB tolerance and switched pt's hearing aid battery. Ended session in recliner, all needs in reach.  Therapy Documentation Precautions:  Precautions Precautions: Fall Precaution Comments: bruising along R side of neck Restrictions Weight Bearing Restrictions: No Vital  Signs: Therapy Vitals Temp: 97.7 F (36.5 C) Pulse Rate: 74 Resp: (!) 22 BP: 108/61 Patient Position (if appropriate): Lying Oxygen Therapy SpO2: 97 % O2 Device: Room Air Pain: Pain Assessment Pain Score: 0-No pain  Therapy/Group: Individual Therapy  Geriann Lafont Clent Demark 07/18/2018, 2:37 PM

## 2018-07-19 ENCOUNTER — Inpatient Hospital Stay (HOSPITAL_COMMUNITY): Payer: Medicare HMO | Admitting: Physical Therapy

## 2018-07-19 ENCOUNTER — Inpatient Hospital Stay (HOSPITAL_COMMUNITY): Payer: Medicare HMO | Admitting: Occupational Therapy

## 2018-07-19 ENCOUNTER — Inpatient Hospital Stay (HOSPITAL_COMMUNITY): Payer: Medicare HMO | Admitting: Speech Pathology

## 2018-07-19 ENCOUNTER — Inpatient Hospital Stay (HOSPITAL_COMMUNITY): Payer: Medicare HMO

## 2018-07-19 LAB — GLUCOSE, CAPILLARY: Glucose-Capillary: 199 mg/dL — ABNORMAL HIGH (ref 70–99)

## 2018-07-19 MED ORDER — IPRATROPIUM-ALBUTEROL 0.5-2.5 (3) MG/3ML IN SOLN
3.0000 mL | Freq: Two times a day (BID) | RESPIRATORY_TRACT | Status: DC
  Administered 2018-07-20: 3 mL via RESPIRATORY_TRACT
  Filled 2018-07-19 (×2): qty 3

## 2018-07-19 MED ORDER — FUROSEMIDE 10 MG/ML IJ SOLN
20.0000 mg | Freq: Once | INTRAMUSCULAR | Status: AC
  Administered 2018-07-19: 20 mg via INTRAVENOUS
  Filled 2018-07-19: qty 2

## 2018-07-19 MED ORDER — IPRATROPIUM-ALBUTEROL 0.5-2.5 (3) MG/3ML IN SOLN
3.0000 mL | RESPIRATORY_TRACT | Status: DC | PRN
  Administered 2018-07-19 – 2018-07-21 (×5): 3 mL via RESPIRATORY_TRACT
  Filled 2018-07-19 (×4): qty 3

## 2018-07-19 NOTE — Progress Notes (Signed)
Patient with upper airway wheeze due to mouth breathing. Limited airway movement and reporting fatigue--wants to get back to bed. Has been getting trazodone 50 mg at bedtime since admission. Will d/c as could e contributing to fatigue and weakness during the day. Will schedule nebs bid--nursing instructed to offer nebs prn wheezing/SOB. Will need therapy paced out. Will repeat CXR.

## 2018-07-19 NOTE — Plan of Care (Signed)
  Problem: RH BLADDER ELIMINATION Goal: RH STG MANAGE BLADDER WITH ASSISTANCE Description STG Manage Bladder With Min  Assistance  Outcome: Not Progressing; incontinence

## 2018-07-19 NOTE — Progress Notes (Signed)
sats 82-100% RA ; placed pt sitting upright ; flutter valve done.

## 2018-07-19 NOTE — Patient Care Conference (Signed)
Inpatient RehabilitationTeam Conference and Plan of Care Update Date: 07/18/2018   Time: 2:45 PM    Patient Name: Evan [name]      Medical Record Number: [id number]  Date of Birth: 06-26-1927 Sex: Male         Room/Bed: 4M04C/4M04C-01 Payor Info: Payor: [employer] MEDICARE / Plan: [place] [employer] / Product Type: *No Product type* /    Admitting Diagnosis: ich  Admit Date/Time:  07/16/2018  2:46 PM Admission Comments: No comment available   Primary Diagnosis:  <principal problem not specified> Principal Problem: <principal problem not specified>  Patient Active Problem List   Diagnosis Date Noted  . SOB (shortness of breath)   . Leukocytosis   . Hyperglycemia   . Labile blood pressure   . History of lung cancer   . Depression   . CKD (chronic kidney disease), stage III ([place])   . Acute blood loss anemia   . Fall 07/16/2018  . 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 Acute spontaneous 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 ([place]) L thalamic 07/14/2018  . Cellulitis of arm, right 06/01/2018  . Rash and nonspecific skin eruption 06/01/2018  . Chest pain 02/02/2018  . Thrombocytopenia ([place]) 02/02/2018  . Hypokalemia 02/02/2018  . Pulmonary embolism ([place]) 02/02/2018  . Elevated troponin 02/02/2018  . Pulmonary embolus ([place]) 02/02/2018  . Non-ST elevation MI (NSTEMI) ([place]) 06/27/2017  . Diastolic CHF ([place]) 68/34/1962  . Chest pain due to CAD ([place]) 06/26/2017  . Dyspnea 06/21/2017  . Acute bronchitis 05/15/2017  . Oral candidiasis 05/15/2017  . Acute pericarditis   . Chest pain at rest 12/20/2016  . Angina pectoris ([place]) 12/20/2016  . Radiation pneumonitis ([place]) 11/09/2016  . Pleurisy 10/11/2016  . Chest wall pain 10/11/2016  . Atrial fibrillation ([place]) 05/21/2016  . Adenocarcinoma of right lung ([place]) 01/06/2016  . GAD (generalized anxiety disorder) 11/20/2015  . Essential hypertension   . Cellulitis  09/04/2015  . Cellulitis of left axilla   . Esophageal ulcer without bleeding   . Bilateral lower extremity edema 04/28/2014  . BPH (benign prostatic hyperplasia) 10/08/2012  . Anxiety   . EKG abnormalities 09/05/2012  . Tremor 09/05/2012  . Chronic fatigue 09/05/2012  . Arthritis   . Asthma, chronic   . Reflux esophagitis   . S/P CABG (coronary artery bypass graft)   . GERD (gastroesophageal reflux disease)   . Hyperlipidemia   . Hypertension   . UNSPECIFIED PERIPHERAL VASCULAR DISEASE 06/16/2009    Expected Discharge Date: Expected Discharge Date: (3-4 weeks)  Team Members Present: Physician leading conference: Dr. Delice Lesch Social Worker Present: Lennart Pall, LCSW Nurse Present: Other (comment)(Woody Ricard Dillon, RN) PT Present: Burnard Bunting, PT OT Present: Mariane Masters, OT SLP Present: Weston Anna, SLP PPS Coordinator present : Gunnar Fusi     Current Status/Progress Goal Weekly Team Focus  Medical   Functional and cognitive deficits secondary to left thalamic hemorrhage.   Improve mobility, cognition, platelets, cullulitis, CKD, BP, hyperglycemia, WBCs  See above   Bowel/Bladder   incontinent of B/B LBM 04/13  continent of B/B normal bowel pattern  timed toileting assist prn  laxatives prn   Swallow/Nutrition/ Hydration   Dys. 2 textures with thin liquids, Full Supervision  Mod A  tolerance of diet, use of swallowing strategies   ADL's   Total A BADLs overall, fatigues very fast, R inattention, R hemi  Min A  R NMR, self-care retraining, activity tolerance, UB/LB strengthening, coordination, transfers  Mobility   mod assist bed mobility, max assist stand pivot transfer, poor midline orientation, absent proprioception/sensation on R side  min assist overall  initiating gait and w/c mobility, transfers, OOB tolerance/endurance   Communication             Safety/Cognition/ Behavioral Observations  Max A   Min-Mod A  orientation, problem solving   Pain    minimal pain tylenol with relief   free of pain   assess for pain qshift and prn   Skin   Ecchymosis with hematoma Right side of neck, skin tears x 2 Right arm aquacell with foam buttocks MASD   resolution of current issues no further skin breakdown or infection treatments as ordered  assess skin qshift daily treatments as ordered    Rehab Goals Patient on target to meet rehab goals: Yes *See Care Plan and progress notes for long and short-term goals.     Barriers to Discharge  Current Status/Progress Possible Resolutions Date Resolved   Physician    Medical stability     See above  Therapies, follow labs, cont abx      Nursing                  PT  Medical stability                 OT                  SLP   poor hearing acuity            SW                Discharge Planning/Teaching Needs:  Pt to return home with family providing 24/7 assistance.    Teaching to be planned closer to d/c.   Team Discussion:  Watching labs, BP; UCX pending, incont b/b.  Max assist overall with tfs.  Poor postural control.  Min assist goals overall.  Total assist with self-care.  Max assist with cognition + very HOH.  Anticipate longer LOS.  Revisions to Treatment Plan:  NA    Continued Need for Acute Rehabilitation Level of Care: The patient requires daily medical management by a physician with specialized training in physical medicine and rehabilitation for the following conditions: Daily direction of a multidisciplinary physical rehabilitation program to ensure safe treatment while eliciting the highest outcome that is of practical value to the patient.: Yes Daily medical management of patient stability for increased activity during participation in an intensive rehabilitation regime.: Yes Daily analysis of laboratory values and/or radiology reports with any subsequent need for medication adjustment of medical intervention for : Renal problems;Other;Blood pressure problems;Neurological  problems   I attest that I was present, lead the team conference, and concur with the assessment and plan of the team.   Donata Clay, Abdur Hoglund 07/19/2018, 1:01 PM   Team conference was held via web/ teleconference due to [place] - 41

## 2018-07-19 NOTE — Progress Notes (Signed)
Social Work Patient ID: Evan [name], male   DOB: 07-24-1927, 85 y.o.   MRN: [id number]   Have reviewed team conference with pt and with daughter, [name].  They are aware and agreeable with ELOS of 3-4 weeks and min assist goals overall.  Will continue to follow.  Yianni Skilling, LCSW

## 2018-07-19 NOTE — Progress Notes (Addendum)
Physical Therapy Session Note  Patient Details  Name: Evan [name] MRN: [id number] Date of Birth: [birth year]  Today's Date: 07/19/2018 PT Individual Time: 7510-2585 PT Individual Time Calculation (min): 15 min   Short Term Goals: Week 1:  PT Short Term Goal 1 (Week 1): Pt will initiate gait training PT Short Term Goal 2 (Week 1): Pt will initiate w/c mobility training PT Short Term Goal 3 (Week 1): Pt will transfer bed<>chair w/ mod assist PT Short Term Goal 4 (Week 1): Pt will attend to R side during functional mobility w/o cues 50% of the time  Skilled Therapeutic Interventions/Progress Updates:   Session 1:  Pt missed 10 min of skilled PT at beginning of session 2/2 nursing care/toileting. Pt in recliner and agreeable to therapy, denies pain but reports feeling overall bad today. Does not explain further but then later notes feeling cramp in LLE. Pt SOB and wheezing upon entering room, requesting to return to supine to rest and doesn't feel he can participate today. Attempted x2 sit<>stands to stedy, however pt unable to reach full upright despite dependent assist and w/ heavy R lateral lean. Pt w/ increasing SOB while doing this. Pt then severely SOB while answering phone when daughter called. Educated pt on importance of rest and pt agreeable to turn phone ringer down to allow him some rest. Pt remained sitting upright in recliner, propped up on pillows to decrease work of breathing. Missed remainder of session of skilled PT 2/2 fatigue, 30 min in total of missed time. Made RN and PA-C aware of SOB and wheezing w/ minimal activity.   Session 2:  Pt asleep in supine, slightly SOB. Easily woken up and immediately increased work of breathing and wheezing upon arousal. Pt requesting to rest this afternoon, reports increased soreness/pain in LEs as well as fatigue. Per conversation w/ RN and PA-C after morning session, pt may be over-exerting himself in therapies. Anticipate participation in  therapy will worsen his SOB, impacting meaningful participation. Assisted w/ repositioning in bed for improved airway air flow. Missed 90 min of skilled PT in total today. Will discuss w/ medical/treatment team regarding lowering pt's frequency.   Therapy Documentation Precautions:  Precautions Precautions: Fall Precaution Comments: bruising along R side of neck Restrictions Weight Bearing Restrictions: No  Therapy/Group: Individual Therapy  [name] Arnecia Ector 07/19/2018, 1:48 PM

## 2018-07-19 NOTE — Progress Notes (Signed)
[facility] PHYSICAL MEDICINE & REHABILITATION PROGRESS NOTE  Subjective/Complaints: Patient seen laying in bed this morning.  He slept well overnight per sleep chart he states he needs to use use a urinal.  ROS: Limited due to [facility], but appears to deny CP, shortness of breath, nausea, vomiting, diarrhea.  Objective: Vital Signs: Blood pressure 92/66, pulse 82, temperature 99.2 F (37.3 C), temperature source Oral, resp. rate 16, height 5\' 5"  (1.651 m), weight 79.8 kg, SpO2 92 %. No results found. Recent Labs    07/17/18 1050  WBC 11.0*  HGB 12.9*  HCT 40.4  PLT 131*   Recent Labs    07/17/18 1050  NA 135  K 4.2  CL 101  CO2 21*  GLUCOSE 116*  BUN 21  CREATININE 1.57*  CALCIUM 9.3    Physical Exam: BP 92/66 (BP Location: Left Arm)   Pulse 82   Temp 99.2 F (37.3 C) (Oral)   Resp 16   Ht 5\' 5"  (1.651 m)   Wt 79.8 kg   SpO2 92%   BMI 29.29 kg/m  Constitutional: No distress . Vital signs reviewed. HENT: Normocephalic.  Atraumatic. Eyes: EOMI.  No discharge. Cardiovascular: No JVD. Respiratory: Normal effort. +[place]. GI: Non-distended. Musc: No edema or tenderness in extremities. Neurological:  He isalert. Extremely HOH.  Motor:  RUE: Shoulder abduction 3+/5, elbow flexion/extension 3/5, handgrip 1/5, appears unchanged Right lower extremity: Hip flexion, knee extension 2+/5, ankle dorsiflexion 2/5, appears unchanged Skin: Right neck with ecchymosis and hard indurated area under chin.  Psychiatric: Limited assessment due to [facility]  Assessment/Plan: 1. 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  Physiatrist is providing close team supervision and 24 hour management of active medical problems listed below.  Physiatrist and rehab team continue to assess barriers to discharge/monitor patient progress toward functional and medical goals  Care  Tool:  Bathing    Body parts bathed by patient: Left arm, Chest, Abdomen, Front perineal area   Body parts bathed by helper: Right arm, Buttocks, Right upper leg, Left upper leg, Right lower leg, Left lower leg, Face     Bathing assist Assist Level: Maximal Assistance - Patient 24 - 49%     Upper Body Dressing/Undressing Upper body dressing Upper body dressing/undressing activity did not occur (including orthotics): N/A What is the patient wearing?: Pull over shirt    Upper body assist Assist Level: Maximal Assistance - Patient 25 - 49%    Lower Body Dressing/Undressing Lower body dressing    Lower body dressing activity did not occur: N/A What is the patient wearing?: Underwear/pull up     Lower body assist Assist for lower body dressing: Total Assistance - Patient < 25%     Toileting Toileting Toileting Activity did not occur Landscape architect and hygiene only): Safety/medical concerns  Toileting assist Assist for toileting: Maximal Assistance - Patient 25 - 49%     Transfers Chair/bed transfer  Transfers assist  Chair/bed transfer activity did not occur: Safety/medical concerns  Chair/bed transfer assist level: Maximal Assistance - Patient 25 - 49%     Locomotion Ambulation   Ambulation assist   Ambulation activity did not occur: Safety/medical concerns          Walk 10 feet activity   Assist  Walk 10 feet activity did not occur: Safety/medical concerns        Walk 50 feet activity   Assist Walk 50 feet  with 2 turns activity did not occur: Safety/medical concerns         Walk 150 feet activity   Assist Walk 150 feet activity did not occur: Safety/medical concerns         Walk 10 feet on uneven surface  activity   Assist Walk 10 feet on uneven surfaces activity did not occur: Safety/medical concerns         Wheelchair     Assist Will patient use wheelchair at discharge?: Yes Type of Wheelchair: Manual     Wheelchair assist level: Dependent - Patient 0%      Wheelchair 50 feet with 2 turns activity    Assist        Assist Level: Dependent - Patient 0%   Wheelchair 150 feet activity     Assist     Assist Level: Dependent - Patient 0%      Medical Problem List and Plan: 1.Functional and cognitive 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  Cont CIR   Plan for CT ~4/21 and resume ASA (no other anticoagulation) if improving per Neuro 2. Antithrombotics: -PE/LLEDVT/anticoagulation:None due to [place] -antiplatelet therapy: N/A 3. Pain Management:tylenol prn. 4. Mood:LCSW to follow for evaluation and support. -antipsychotic agents: N/A. Continue ativan tid as at home and Prozac. 5. Neuropsych: This patientis not fullycapable of making decisions onhisown behalf. 6. Skin/Wound Care:Monitor right neck hematoma for resolution. 7. Fluids/Electrolytes/Nutrition:Monitor I/O.  8.Chronic thrombocytopenia/ ABLA: Continue to monitor H/H/Platelets with serial checks. Monitor for signs of bleeding.   Hemoglobin 12.9 on 4/14  Platelets 131 on 4/14  Labs ordered for tomorrow  Continue to monitor 9. Right forearm wound/ recent cellulitis:On Keflex through today--question completed course per records. Added Aqacell to wound as per derm records. 10.CKD stage III: Avoid nephrotoxic medications. Monitor with serial checks.   Creatinine 1.57 on 4/14  Labs ordered for tomorrow  Continue to monitor 11. H/o A fib: Monitor HR bid. Continue metoprolol bid--off Lovenox.  12. H/o Depression: On Prozac daily with ativan tid.  36. H/o Lung cancer: Had PE on Eliquis. No antiplatelets or anticoagulation for now.              Scheduled and prn nebs   CXR reviewed, unremarkable for acute process, will consider repeat if necessary 14. Labile blood pressure  Labile on 4/16, monitor for trend 15. Hyperglycemia  Elevated, but?  Improving on 4/16 16.  Leukocytosis  WBCs 11.0 on 4/14, labs ordered for tomorrow  Afebrile  UA negative, urine culture pending  LOS: 3 days A FACE TO FACE EVALUATION WAS PERFORMED   Lorie Phenix 07/19/2018, 8:34 AM

## 2018-07-19 NOTE — Progress Notes (Signed)
Occupational Therapy Note  Patient Details  Name: Evan [name] MRN: [id number] Date of Birth: 1928/02/23  Today's Date: 07/19/2018 OT Missed Time: 96 Minutes Missed Time Reason: Patient fatigue;Unavailable (comment)  Pt semi-reclined in bed asleep upon OT arrival. Able to wake pt, but pt visibly SOB just laying in bed, so OT felt pt would not be able to tolerate activity. Pt also to be taken to imaging soon. OT to follow up per plan of care.   Daneen Schick Suellen Durocher 07/19/2018, 12:16 PM

## 2018-07-19 NOTE — Care Management (Signed)
[facility] Individual Statement of Services  Patient Name:  Evan [name]  Date:  07/19/2018  Welcome to the [facility].  Our goal is to provide you with an individualized program based on your diagnosis and situation, designed to meet your specific needs.  With this comprehensive rehabilitation program, you will be expected to participate in at least 3 hours of rehabilitation therapies Monday-Friday, with modified therapy programming on the weekends.  Your rehabilitation program will include the following services:  Physical Therapy (PT), Occupational Therapy (OT), Speech Therapy (ST), 24 hour per day rehabilitation nursing, Therapeutic Recreaction (TR), Neuropsychology, Case Management (Social Worker), Rehabilitation Medicine, Nutrition Services and Pharmacy Services  Weekly team conferences will be held on Wednesdays to discuss your progress.  Your Social Worker will talk with you frequently to get your input and to update you on team discussions.  Team conferences with you and your family in attendance may also be held.  Expected length of stay: 3-4 days   Overall anticipated outcome: minimal assistance  Depending on your progress and recovery, your program may change. Your Social Worker will coordinate services and will keep you informed of any changes. Your Social Worker's name and contact numbers are listed  below.  The following services may also be recommended but are not provided by the [facility] will be made to provide these services after discharge if needed.  Arrangements include referral to agencies that provide these services.  Your insurance has been verified to be:  [employer] Your primary doctor is:  [facility]  Pertinent information will be shared with your doctor and your insurance  company.  Social Worker:  [place], [place] or ([phone]   Information discussed with and copy given to patient by: Lennart Pall, 07/19/2018, 4:32 PM

## 2018-07-19 NOTE — Progress Notes (Signed)
CXR with low volumes and vascular crowding. Will give a dose of IV lasix 20 mg. Nursing to work with patient on use of flutter valve every 4 hours WA.

## 2018-07-19 NOTE — Progress Notes (Signed)
CXR results PA made aware.

## 2018-07-19 NOTE — Progress Notes (Signed)
Per therapy pt SOB with activity; Pam was aware in room to check patient; Patient sounds tight on upper lungs on auscultation. Pam ordered to give neb treatment ;

## 2018-07-19 NOTE — Progress Notes (Signed)
Social Work  Social Work Assessment and Plan  Patient Details  Name: Evan [name] MRN: [id number] Date of Birth: 1927-08-02  Today's Date: 07/19/2018  Problem List:  Patient Active Problem List   Diagnosis Date Noted  . SOB (shortness of breath)   . Leukocytosis   . Hyperglycemia   . Labile blood pressure   . History of lung cancer   . Depression   . CKD (chronic kidney disease), stage III ([place])   . Acute blood loss anemia   . Fall 07/16/2018  . 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 Acute spontaneous 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 ([place]) L thalamic 07/14/2018  . Cellulitis of arm, right 06/01/2018  . Rash and nonspecific skin eruption 06/01/2018  . Chest pain 02/02/2018  . Thrombocytopenia ([place]) 02/02/2018  . Hypokalemia 02/02/2018  . Pulmonary embolism ([place]) 02/02/2018  . Elevated troponin 02/02/2018  . Pulmonary embolus ([place]) 02/02/2018  . Non-ST elevation MI (NSTEMI) ([place]) 06/27/2017  . Diastolic CHF ([place]) 57/84/6962  . Chest pain due to CAD ([place]) 06/26/2017  . Dyspnea 06/21/2017  . Acute bronchitis 05/15/2017  . Oral candidiasis 05/15/2017  . Acute pericarditis   . Chest pain at rest 12/20/2016  . Angina pectoris ([place]) 12/20/2016  . Radiation pneumonitis ([place]) 11/09/2016  . Pleurisy 10/11/2016  . Chest wall pain 10/11/2016  . Atrial fibrillation ([place]) 05/21/2016  . Adenocarcinoma of right lung ([place]) 01/06/2016  . GAD (generalized anxiety disorder) 11/20/2015  . Essential hypertension   . Cellulitis 09/04/2015  . Cellulitis of left axilla   . Esophageal ulcer without bleeding   . Bilateral lower extremity edema 04/28/2014  . BPH (benign prostatic hyperplasia) 10/08/2012  . Anxiety   . EKG abnormalities 09/05/2012  . Tremor 09/05/2012  . Chronic fatigue 09/05/2012  . Arthritis   . Asthma, chronic   . Reflux esophagitis   . S/P CABG (coronary artery bypass graft)   . GERD  (gastroesophageal reflux disease)   . Hyperlipidemia   . Hypertension   . UNSPECIFIED PERIPHERAL VASCULAR DISEASE 06/16/2009   Past Medical History:  Past Medical History:  Diagnosis Date  . Adenocarcinoma of right lung ([place]) 01/06/2016  . Anginal chest pain at rest [facility])    Chronic non, controlled on Lorazepam  . Anxiety   . Arthritis    "all over"   . BPH (benign prostatic hyperplasia)   . CAD (coronary artery disease)   . Chronic lower back pain   . Complication of anesthesia    "problems making water afterwards"  . Depression   . DJD (degenerative joint disease)   . Esophageal ulcer without bleeding    bid ppi indefinitely  . Family history of adverse reaction to anesthesia    "children w/PONV"  . GERD (gastroesophageal reflux disease)   . Headache    "couple/week maybe" (09/04/2015)  . Hemochromatosis    Possible, elevated iron stores, hook-like osteophytes on hand films, normal LFTs  . Hepatitis    "yellow jaundice as a baby"  . History of ASCVD    MULTIVESSEL  . Hyperlipidemia   . Hypertension   . Myocardial infarction ([place]) 1999  . Osteoarthritis   . Pneumonia    "several times; got a little now" (09/04/2015)  . Rheumatoid arthritis ([place])   . Subdural hematoma ([facility])    small after fall 09/2016-plavix held, neurosurgery consulted.  Asymptomatic.    Marland Kitchen 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[place]) 02/2018   on Lovenox lifelong since he failed PO Eliquis  Past Surgical History:  Past Surgical History:  Procedure Laterality Date  . ARM SKIN LESION BIOPSY / EXCISION Left 09/02/2015  . CATARACT EXTRACTION W/ INTRAOCULAR LENS  IMPLANT, BILATERAL Bilateral   . CHOLECYSTECTOMY OPEN    . CORNEAL TRANSPLANT Bilateral    "one at [facility]; one at [facility]"  . CORONARY ANGIOPLASTY WITH STENT PLACEMENT    . CORONARY ARTERY BYPASS GRAFT  1999  . DESCENDING AORTIC ANEURYSM REPAIR W/ STENT     DE stent ostium into the right radial free graft at OM-, 02-2007  . ESOPHAGOGASTRODUODENOSCOPY N/A 11/09/2014    Procedure: ESOPHAGOGASTRODUODENOSCOPY (EGD);  Surgeon: [name], MD;  Location: [facility] ENDOSCOPY;  Service: Endoscopy;  Laterality: N/A;  . INGUINAL HERNIA REPAIR    . JOINT REPLACEMENT    . LEFT HEART CATH AND CORS/GRAFTS ANGIOGRAPHY N/A 06/27/2017   Procedure: LEFT HEART CATH AND CORS/GRAFTS ANGIOGRAPHY;  Surgeon: [name], MD;  Location: [place] CV LAB;  Service: Cardiovascular;  Laterality: N/A;  . NASAL SINUS SURGERY    . SHOULDER OPEN ROTATOR CUFF REPAIR Right   . TOTAL KNEE ARTHROPLASTY Bilateral    Social History:  reports that he has never smoked. He has quit using smokeless tobacco.  His smokeless tobacco use included chew. He reports that he does not drink alcohol or use drugs.  Family / Support Systems Marital Status: Widow/Widower Patient Roles: Parent Children: daughter, Andreas Blower @ (H) [phone] or Loletha [phone];  son, Won Kreuzer @ [phone]; son, [place] @ [phone]; daughter, Manuela Schwartz and daughter, Otila Kluver.  All living with/ close to pt except Otila Kluver. Anticipated Caregiver: two daughters and 3 sons Ability/Limitations of Caregiver: no limitations Caregiver Availability: 24/7 Family Dynamics: Daughter reports that "we all do our part...we will take care of him...".    Social History Preferred language: English Religion: [employer] Cultural Background: NA Read: Yes Write: Yes Employment Status: Retired Public relations account executive Issues: None Guardian/Conservator: None - per MD, pt is not fully capable of making decisions on his own behalf - defer to daughter.   Abuse/Neglect Abuse/Neglect Assessment Can Be Completed: Yes Physical Abuse: Denies Verbal Abuse: Denies Sexual Abuse: Denies Exploitation of patient/patient's resources: Denies Self-Neglect: Denies  Emotional Status Pt's affect, behavior and adjustment status: Pt laying in bed and appears SOB and fatigued. Very difficult to complete assessment due to his cognition and very HOH.   He does not appear to be in any emotional distress - will monitor. Recent Psychosocial Issues: None Psychiatric History: None Substance Abuse History: None  Patient / Family Perceptions, Expectations & Goals Pt/Family understanding of illness & functional limitations: Pt with very limited understanding of his stroke.  Daughter with general understanding of his stroke, however, does ask if "he's going to be paralyzed?"  Reviewed current functional deficits as well as goals.  Education to continue. Premorbid pt/family roles/activities: Daughter descries pt's functional level PTA as "very independent...was still driving himself to church on Sundays."  Notes family did not assist him in any way. Anticipated changes in roles/activities/participation: Per goals of min assist, family will need to provide 24/7 caregiver support. Pt/family expectations/goals: per daughter, "I just hope he's gonna be able to walk."  [employer]: None Premorbid Home Care/DME Agencies: None Transportation available at discharge: yes  Discharge Planning Living Arrangements: Children Support Systems: Children Type of Residence: Private residence Insurance Resources: Medicare(([employer] Medicare)) Financial Resources: Radio broadcast assistant Screen Referred: No Living Expenses: Lives with family Money Management: Family Does the patient have  any problems obtaining your medications?: No Home Management: mostly family Patient/Family Preliminary Plans: Pt to return home to live with daughter, Manuela Schwartz and other family members to assist in providing 24/7 support. Social Work Anticipated Follow Up Needs: HH/OP, Support Group Expected length of stay: 3-4 weeks  Clinical Impression Frail appearing, elderly gentleman here following a stroke at home.  Family reports pt was "very independent" PTA.  Now with significant deficits and is very [facility] which makes it difficult to complete interview.  Family  prepared to provide 24/7 support at d/c.  Pt does not appear to be in any emotional distress - will monitor.  Will follow for support and d/c planning.  Ivery Michalski 07/19/2018, 2:44 PM

## 2018-07-19 NOTE — Progress Notes (Signed)
Speech Language Pathology Daily Session Note  Patient Details  Name: Evan [name] MRN: [id number] Date of Birth: [birth year]  Today's Date: 07/19/2018 SLP Individual Time: 4656-8127 SLP Individual Time Calculation (min): 45 min  Short Term Goals: Week 1: SLP Short Term Goal 1 (Week 1): Pt will demonstrate orientation x4 with min verbal cues. SLP Short Term Goal 2 (Week 1): Pt will complete basic problem solving/thought organization/ reasoning tasks with min assist given verbal cues SLP Short Term Goal 3 (Week 1): Pt will tolerate dysphagia 2 diet and thin liquids without overt s/s aspiration or decline in respiratory status. SLP Short Term Goal 4 (Week 1): Pt will verbalize safe swallow strategies for minimizing aspiration risk with min cues  Skilled Therapeutic Interventions: Skilled treatment session focused on cognitive and dysphagia goals. Upon arrival, patient had been incontinent of urine and bowel. SLP facilitated session by providing Max verbal and tactile cues for follow commands in regards to bed mobility. Patient transferred to the recliner to maximize arousal and overall safe positioning for PO intake. Patient required extra time and total +3 for transfer via the [facility]. Patient consumed breakfast meal of Dys. 2 textures with thin liquids with minimal overt s/s of aspiration. Recommend patient continue current diet for energy conservation. Patient did not have his supplemental oxygen on when SLP entered the room or throughout session. Patient's O2 remained WFL. Patient left upright in recliner with NT present. Continue with current plan of care.      Pain No/Denies Pain  Therapy/Group: Individual Therapy  Gonsalo Cuthbertson 07/19/2018, 8:50 AM

## 2018-07-20 ENCOUNTER — Inpatient Hospital Stay (HOSPITAL_COMMUNITY): Payer: Medicare HMO | Admitting: Occupational Therapy

## 2018-07-20 ENCOUNTER — Inpatient Hospital Stay (HOSPITAL_COMMUNITY): Payer: Medicare HMO | Admitting: Physical Therapy

## 2018-07-20 ENCOUNTER — Inpatient Hospital Stay (HOSPITAL_COMMUNITY): Payer: Medicare HMO | Admitting: Speech Pathology

## 2018-07-20 ENCOUNTER — Ambulatory Visit: Payer: Medicare HMO | Admitting: Physician Assistant

## 2018-07-20 DIAGNOSIS — R5383 Other fatigue: Secondary | ICD-10-CM

## 2018-07-20 DIAGNOSIS — E871 Hypo-osmolality and hyponatremia: Secondary | ICD-10-CM

## 2018-07-20 DIAGNOSIS — R7309 Other abnormal glucose: Secondary | ICD-10-CM

## 2018-07-20 LAB — BASIC METABOLIC PANEL
Anion gap: 12 (ref 5–15)
BUN: 31 mg/dL — ABNORMAL HIGH (ref 8–23)
CO2: 20 mmol/L — ABNORMAL LOW (ref 22–32)
Calcium: 8.8 mg/dL — ABNORMAL LOW (ref 8.9–10.3)
Chloride: 100 mmol/L (ref 98–111)
Creatinine, Ser: 1.64 mg/dL — ABNORMAL HIGH (ref 0.61–1.24)
GFR calc Af Amer: 43 mL/min — ABNORMAL LOW (ref >60)
GFR calc non Af Amer: 37 mL/min — ABNORMAL LOW (ref >60)
Glucose, Bld: 130 mg/dL — ABNORMAL HIGH (ref 70–99)
Potassium: 4.1 mmol/L (ref 3.5–5.1)
Sodium: 132 mmol/L — ABNORMAL LOW (ref 135–145)

## 2018-07-20 LAB — CBC WITH DIFFERENTIAL/PLATELET
Abs Immature Granulocytes: 0.11 10*3/uL — ABNORMAL HIGH (ref 0.00–0.07)
Basophils Absolute: 0 10*3/uL (ref 0.0–0.1)
Basophils Relative: 0 %
Eosinophils Absolute: 0.1 10*3/uL (ref 0.0–0.5)
Eosinophils Relative: 1 %
HCT: 33.4 % — ABNORMAL LOW (ref 39.0–52.0)
Hemoglobin: 11 g/dL — ABNORMAL LOW (ref 13.0–17.0)
Immature Granulocytes: 1 %
Lymphocytes Relative: 14 %
Lymphs Abs: 1.4 10*3/uL (ref 0.7–4.0)
MCH: 30.9 pg (ref 26.0–34.0)
MCHC: 32.9 g/dL (ref 30.0–36.0)
MCV: 93.8 fL (ref 80.0–100.0)
Monocytes Absolute: 1.9 10*3/uL — ABNORMAL HIGH (ref 0.1–1.0)
Monocytes Relative: 18 %
Neutro Abs: 6.9 10*3/uL (ref 1.7–7.7)
Neutrophils Relative %: 66 %
Platelets: 128 10*3/uL — ABNORMAL LOW (ref 150–400)
RBC: 3.56 MIL/uL — ABNORMAL LOW (ref 4.22–5.81)
RDW: 13.7 % (ref 11.5–15.5)
WBC: 10.4 10*3/uL (ref 4.0–10.5)
nRBC: 0 % (ref 0.0–0.2)

## 2018-07-20 LAB — GLUCOSE, CAPILLARY
Glucose-Capillary: 153 mg/dL — ABNORMAL HIGH (ref 70–99)
Glucose-Capillary: 132 mg/dL — ABNORMAL HIGH (ref 70–99)
Glucose-Capillary: 159 mg/dL — ABNORMAL HIGH (ref 70–99)
Glucose-Capillary: 144 mg/dL — ABNORMAL HIGH (ref 70–99)

## 2018-07-20 NOTE — Progress Notes (Signed)
[place] PHYSICAL MEDICINE & REHABILITATION PROGRESS NOTE  Subjective/Complaints: Patient seen laying in bed this morning.  He slept well overnight per sleep chart.  Per patient, he states he did not sleep at all.  Yesterday, patient noted to have shortness of breath and fatigue, please see PA notes.  ROS: Limited due to [facility], but appears to deny CP, shortness of breath, nausea, vomiting, diarrhea.  Objective: Vital Signs: Blood pressure (!) 160/71, pulse 76, temperature 97.9 F (36.6 C), resp. rate 15, height 5\' 5"  (1.651 m), weight 79.8 kg, SpO2 93 %. Dg Chest 2 View  Result Date: 07/19/2018 CLINICAL DATA:  85 year old male with a history of shortness of breath and fatigue EXAM: CHEST - 2 VIEW COMPARISON:  07/16/2018 FINDINGS: Cardiomediastinal silhouette unchanged in size and contour with cardiomegaly. Surgical changes of median sternotomy. Low lung volumes with early vascular congestion and interlobular septal thickening. No pneumothorax. No displaced fracture. Blunting of the right costophrenic angle and left costophrenic angle. IMPRESSION: Low lung volumes, with evidence of atelectasis/scarring and/or early pulmonary edema. Median sternotomy and CABG Electronically Signed   By: Corrie Mckusick D.O.   On: 07/19/2018 12:25   Recent Labs    07/17/18 1050 07/20/18 0629  WBC 11.0* 10.4  HGB 12.9* 11.0*  HCT 40.4 33.4*  PLT 131* 128*   Recent Labs    07/17/18 1050 07/20/18 0629  NA 135 132*  K 4.2 4.1  CL 101 100  CO2 21* 20*  GLUCOSE 116* 130*  BUN 21 31*  CREATININE 1.57* 1.64*  CALCIUM 9.3 8.8*    Physical Exam: BP (!) 160/71 (BP Location: Left Arm)   Pulse 76   Temp 97.9 F (36.6 C)   Resp 15   Ht 5\' 5"  (1.651 m)   Wt 79.8 kg   SpO2 93%   BMI 29.29 kg/m  Constitutional: No distress . Vital signs reviewed. HENT: Normocephalic.  Atraumatic. Eyes: EOMI.  No discharge. Cardiovascular: No JVD. Respiratory: Normal effort. + [place]. GI: Non-distended. Musc: No edema or  tenderness in extremities. Neurological:  He isalert. Extremely HOH.  Motor:  RUE: Shoulder abduction 4-/5, elbow flexion/extension 4--4/5, handgrip 4--4/5, appears unchanged Right lower extremity: Hip flexion, knee extension 3+-4-/5, ankle dorsiflexion 3/5 Skin: Right neck with ecchymosis and hard indurated area under chin.  Psychiatric: Limited assessment due to [facility]  Assessment/Plan: 1. 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  Physiatrist is providing close team supervision and 24 hour management of active medical problems listed below.  Physiatrist and rehab team continue to assess barriers to discharge/monitor patient progress toward functional and medical goals  Care Tool:  Bathing    Body parts bathed by patient: Left arm, Chest, Abdomen, Front perineal area   Body parts bathed by helper: Right arm, Buttocks, Right upper leg, Left upper leg, Right lower leg, Left lower leg, Face     Bathing assist Assist Level: Maximal Assistance - Patient 24 - 49%     Upper Body Dressing/Undressing Upper body dressing Upper body dressing/undressing activity did not occur (including orthotics): N/A What is the patient wearing?: Pull over shirt    Upper body assist Assist Level: Maximal Assistance - Patient 25 - 49%    Lower Body Dressing/Undressing Lower body dressing    Lower body dressing activity did not occur: N/A What is the patient wearing?: Underwear/pull up     Lower body assist Assist for lower body dressing:  Total Assistance - Patient < 25%     Toileting Toileting Toileting Activity did not occur Landscape architect and hygiene only): N/A (no void or bm)  Toileting assist Assist for toileting: Maximal Assistance - Patient 25 - 49%     Transfers Chair/bed transfer  Transfers assist  Chair/bed transfer activity did not occur: Safety/medical  concerns  Chair/bed transfer assist level: Maximal Assistance - Patient 25 - 49%     Locomotion Ambulation   Ambulation assist   Ambulation activity did not occur: Safety/medical concerns          Walk 10 feet activity   Assist  Walk 10 feet activity did not occur: Safety/medical concerns        Walk 50 feet activity   Assist Walk 50 feet with 2 turns activity did not occur: Safety/medical concerns         Walk 150 feet activity   Assist Walk 150 feet activity did not occur: Safety/medical concerns         Walk 10 feet on uneven surface  activity   Assist Walk 10 feet on uneven surfaces activity did not occur: Safety/medical concerns         Wheelchair     Assist Will patient use wheelchair at discharge?: Yes Type of Wheelchair: Manual    Wheelchair assist level: Dependent - Patient 0%      Wheelchair 50 feet with 2 turns activity    Assist        Assist Level: Dependent - Patient 0%   Wheelchair 150 feet activity     Assist     Assist Level: Dependent - Patient 0%      Medical Problem List and Plan: 1.Functional and cognitive 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  Cont CIR   Plan for CT ~4/21 and resume ASA (no other anticoagulation) if improving per Neuro 2. Antithrombotics: -PE/LLEDVT/anticoagulation:None due to [place] -antiplatelet therapy: N/A 3. Pain Management:tylenol prn. 4. Mood:LCSW to follow for evaluation and support. -antipsychotic agents: N/A. Continue ativan tid as at home and Prozac. 5. Neuropsych: This patientis not fullycapable of making decisions onhisown behalf. 6. Skin/Wound Care:Monitor right neck hematoma for resolution. 7. Fluids/Electrolytes/Nutrition:Monitor I/O.  8.Chronic thrombocytopenia/ ABLA: Continue to monitor H/H/Platelets with serial checks. Monitor for signs of bleeding.   Hemoglobin 11.0 on 4/17  Platelets 128 on 4/17  Labs  ordered for Monday  Continue to monitor 9. Right forearm wound/ recent cellulitis:Completed course of Keflex on 4/16. Added Aqacell to wound as per derm records. 10.CKD stage III: Avoid nephrotoxic medications. Monitor with serial checks.   Creatinine 1.64 on 4/17, likely from IV diuretics given yesterday  Encourage fluids, will consider IVF if necessary  Labs ordered for Monday  Continue to monitor 11. H/o A fib: Monitor HR bid. Continue metoprolol bid--off Lovenox.  12. H/o Depression: On Prozac daily with ativan tid.  75. H/o Lung cancer: Had PE on Eliquis. No antiplatelets or anticoagulation for now.              Scheduled and prn nebs  Pulmonary toilet   CXR reviewed, unremarkable for acute process, repeat chest x-ray reviewed showing pulmonary edema +/- atelectasis  Continue supplemental oxygen 14. Labile blood pressure  Labile on 4/17 with a.m. elevations the last 2 days, monitor for trend 15. Hyperglycemia  Labile on 4/17 16. Leukocytosis: Resolved  WBCs 10.4 on 4/17  Afebrile  UA negative, urine culture remains pending 17.  Hyponatremia  Sodium 132 on 4/17, likely from IV diuretics given  yesterday  Labs ordered for Monday  LOS: 4 days A FACE TO FACE EVALUATION WAS PERFORMED  Darielle Hancher Lorie Phenix 07/20/2018, 8:40 AM

## 2018-07-20 NOTE — Progress Notes (Signed)
Occupational Therapy Session Note  Patient Details  Name: Evan [name] MRN: [id number] Date of Birth: 04-12-1927  Today's Date: 07/20/2018 OT Individual Time: 1000-1030 OT Individual Time Calculation (min): 30 min   Short Term Goals: Week 1:  OT Short Term Goal 1 (Week 1): Pt will complete toilet transfer with max A  OT Short Term Goal 2 (Week 1): Pt will complete sit<>stand with max A in preparation for BADL tasks OT Short Term Goal 3 (Week 1): Pt will tolerate sitting EOB for 4 mins to increase endurance for BADL tasks  Skilled Therapeutic Interventions/Progress Updates:    Pt greeted semi-reclined in bed awake with intermittent bouts of coughing. Pt initially states he does not feel good and doesn't think he can do anything. Eventually, pt agreeable to try sitting EOB. Pt needed max/total A to come to sitting EOB. Pt with noted pusher syndrome tendency and needed mod A to maintain sitting balance initially. OT placed pillow on L side and had pt rest L elbow down onto pillow. Pushing decreased and pt able to sit without OT support for 2 minutes. Educated on deep breathing strategies, but pt had difficulty breathing in through nose. Engaged pt in reaching activity with R UE and guided A. R UE weakness more proximal, but has difficulty motor planning movement as well. Pt returned to bed with max A to lift BLEs into bed. Trendelenburg used to scoot pt up in bed and pt left semi-reclined with needs met.  Therapy Documentation Precautions:  Precautions Precautions: Fall Precaution Comments: bruising along R side of neck Restrictions Weight Bearing Restrictions: No Vital Signs: Oxygen Therapy SpO2: 93 % O2 Device: Room Air Pain: Pain Assessment Pain Scale: 0-10 Pain Score: 0-No pain   Therapy/Group: Individual Therapy  Valma Cava 07/20/2018, 10:30 AM

## 2018-07-20 NOTE — Progress Notes (Addendum)
Physical Therapy Session Note  Patient Details  Name: Evan [name] MRN: [id number] Date of Birth: Jan 27, 1928  Today's Date: 07/20/2018 PT Individual Time: 1430-1450 PT Individual Time Calculation (min): 20 min  and Today's Date: 07/20/2018 PT Missed Time: 40 Minutes Missed Time Reason: Patient fatigue;Other (Comment)(SOB)  Short Term Goals: Week 1:  PT Short Term Goal 1 (Week 1): Pt will initiate gait training PT Short Term Goal 2 (Week 1): Pt will initiate w/c mobility training PT Short Term Goal 3 (Week 1): Pt will transfer bed<>chair w/ mod assist PT Short Term Goal 4 (Week 1): Pt will attend to R side during functional mobility w/o cues 50% of the time  Skilled Therapeutic Interventions/Progress Updates:   Pt in supine and visibly labored breathing and coughing while talking with this therapist. Pt declined sitting up or OOB therapy despite encouragement. Attempted to work on BLE strengthening in supine, however pt yelling out in pain when therapist touched LEs, LLE hurting more so than RLE. Pt tender to palpation at L lateral and lower shin. Mild swelling and pt's entire leg warm to touch, made RN aware. Pt agreeable to try ice for pain relief. Ice applied to tender spot and provided pt w/ sips of drink, per his request. Able to utilize LUE a few times, then requiring max hand-over-hand assist 2/2 weakness. Ended session in supine, all needs in reach. Ongoing education w/ patient and discussion w/ nursing regarding positioning for improved airflow and for decreased accessory muscle use. Missed 40 min of skilled PT 2/2 fatigue/SOB and LLE pain.   Therapy Documentation Precautions:  Precautions Precautions: Fall Precaution Comments: bruising along R side of neck Restrictions Weight Bearing Restrictions: No Vital Signs: Oxygen Therapy SpO2: 93 % O2 Device: Room Air Pain: Pain Assessment Pain Scale: 0-10 Pain Score: 0-No pain  Therapy/Group: Individual Therapy  [name] [name] K  Quincey Quesinberry 07/20/2018, 3:02 PM

## 2018-07-20 NOTE — Progress Notes (Signed)
Speech Language Pathology Daily Session Note  Patient Details  Name: Evan [name] MRN: [id number] Date of Birth: 1928/03/07  Today's Date: 07/20/2018 SLP Individual Time: 27-1425 SLP Individual Time Calculation (min): 40 min  Short Term Goals: Week 1: SLP Short Term Goal 1 (Week 1): Pt will demonstrate orientation x4 with min verbal cues. SLP Short Term Goal 2 (Week 1): Pt will complete basic problem solving/thought organization/ reasoning tasks with min assist given verbal cues SLP Short Term Goal 3 (Week 1): Pt will tolerate dysphagia 2 diet and thin liquids without overt s/s aspiration or decline in respiratory status. SLP Short Term Goal 4 (Week 1): Pt will verbalize safe swallow strategies for minimizing aspiration risk with min cues  Skilled Therapeutic Interventions: Skilled treatment session focused on cognitive goals. Upon arrival, patient was asleep but easily awakened. Patient independently reported he had been incontinent of urine and required Min A verbal and tactile cues to follow basic commands during bed mobility. While semi-flat during self-care, patient's face appeared to turn red with shortness of breath, however, color returned to normal when patient repositioned to semi-upright. Patient required Max A verbal cues for utilization of external aids for orientation to date but only Min A verbal cues for orientation to place. Patient also required total A to sort coins from a field of 4. Throughout session, patient with increased coughing and work of breathing. RN aware. Patient left upright in bed with all needs within reach. Continue with current plan of care.      Pain Pain Assessment Pain Scale: 0-10 Pain Score: 0-No pain  Therapy/Group: Individual Therapy  Sameena Artus 07/20/2018, 2:50 PM

## 2018-07-20 NOTE — Progress Notes (Signed)
Occupational Therapy Session Note  Patient Details  Name: Evan [name] MRN: [id number] Date of Birth: [birth year]  Today's Date: 07/20/2018 OT Individual Time: 3383-2919 OT Individual Time Calculation (min): 80 min    Short Term Goals: Week 1:  OT Short Term Goal 1 (Week 1): Pt will complete toilet transfer with max A  OT Short Term Goal 2 (Week 1): Pt will complete sit<>stand with max A in preparation for BADL tasks OT Short Term Goal 3 (Week 1): Pt will tolerate sitting EOB for 4 mins to increase endurance for BADL tasks  Skilled Therapeutic Interventions/Progress Updates:    Patient in bed, states "I just don't feel good"  He denies specific pain.  Nursing aware and reported that he did not sleep well last night.   Eating - max A/dependent.  Sponge bath completed at bed level max A/dependent, UB dressing max A, LB dressing dependent.  Patient requires mod a to roll right and left.  He is max A/dependent for SSP to and from supine.  Sat at edge of bed with mod A.  Lateral scoot on bed in preparation for transfer to w/c with max A - transfer not completed due to patient report of not feeling well.  Patient returned to bed and positioned with HOB elevated.  Low boy bed returned to lowest position with call bell and tray table in reach  Therapy Documentation Precautions:  Precautions Precautions: Fall Precaution Comments: bruising along R side of neck Restrictions Weight Bearing Restrictions: No General:   Vital Signs:  Pain: Pain Assessment Pain Scale: 0-10 Pain Score: 0-No pain   Other Treatments:     Therapy/Group: Individual Therapy  Carlos Levering 07/20/2018, 12:08 PM

## 2018-07-21 ENCOUNTER — Inpatient Hospital Stay (HOSPITAL_COMMUNITY): Payer: Medicare HMO | Admitting: Physical Therapy

## 2018-07-21 ENCOUNTER — Inpatient Hospital Stay (HOSPITAL_COMMUNITY): Payer: Medicare HMO

## 2018-07-21 ENCOUNTER — Inpatient Hospital Stay (HOSPITAL_COMMUNITY): Payer: Medicare HMO | Admitting: Speech Pathology

## 2018-07-21 ENCOUNTER — Inpatient Hospital Stay (HOSPITAL_COMMUNITY): Payer: Medicare HMO | Admitting: Occupational Therapy

## 2018-07-21 ENCOUNTER — Encounter (HOSPITAL_COMMUNITY): Payer: Self-pay | Admitting: Radiology

## 2018-07-21 DIAGNOSIS — D72823 Leukemoid reaction: Secondary | ICD-10-CM

## 2018-07-21 LAB — BLOOD GAS, ARTERIAL
Acid-base deficit: 1.5 mmol/L (ref 0.0–2.0)
Bicarbonate: 21.5 mmol/L (ref 20.0–28.0)
Drawn by: 270221
O2 Content: 2 L/min
O2 Saturation: 92.2 %
Patient temperature: 97.7
pCO2 arterial: 28.4 mmHg — ABNORMAL LOW (ref 32.0–48.0)
pH, Arterial: 7.491 — ABNORMAL HIGH (ref 7.350–7.450)
pO2, Arterial: 62.5 mmHg — ABNORMAL LOW (ref 83.0–108.0)

## 2018-07-21 LAB — CBC
HCT: 33.8 % — ABNORMAL LOW (ref 39.0–52.0)
Hemoglobin: 10.8 g/dL — ABNORMAL LOW (ref 13.0–17.0)
MCH: 30.3 pg (ref 26.0–34.0)
MCHC: 32 g/dL (ref 30.0–36.0)
MCV: 94.9 fL (ref 80.0–100.0)
Platelets: 131 10*3/uL — ABNORMAL LOW (ref 150–400)
RBC: 3.56 MIL/uL — ABNORMAL LOW (ref 4.22–5.81)
RDW: 13.8 % (ref 11.5–15.5)
WBC: 9.6 10*3/uL (ref 4.0–10.5)
nRBC: 0 % (ref 0.0–0.2)

## 2018-07-21 LAB — BASIC METABOLIC PANEL
Anion gap: 12 (ref 5–15)
BUN: 33 mg/dL — ABNORMAL HIGH (ref 8–23)
CO2: 21 mmol/L — ABNORMAL LOW (ref 22–32)
Calcium: 8.9 mg/dL (ref 8.9–10.3)
Chloride: 99 mmol/L (ref 98–111)
Creatinine, Ser: 1.63 mg/dL — ABNORMAL HIGH (ref 0.61–1.24)
GFR calc Af Amer: 43 mL/min — ABNORMAL LOW (ref >60)
GFR calc non Af Amer: 37 mL/min — ABNORMAL LOW (ref >60)
Glucose, Bld: 133 mg/dL — ABNORMAL HIGH (ref 70–99)
Potassium: 4.4 mmol/L (ref 3.5–5.1)
Sodium: 132 mmol/L — ABNORMAL LOW (ref 135–145)

## 2018-07-21 LAB — GLUCOSE, CAPILLARY: Glucose-Capillary: 147 mg/dL — ABNORMAL HIGH (ref 70–99)

## 2018-07-21 MED ORDER — PREDNISONE 20 MG PO TABS
20.0000 mg | ORAL_TABLET | Freq: Two times a day (BID) | ORAL | Status: AC
  Administered 2018-07-21 (×2): 20 mg via ORAL
  Filled 2018-07-21 (×2): qty 1

## 2018-07-21 MED ORDER — IOHEXOL 350 MG/ML SOLN
80.0000 mL | Freq: Once | INTRAVENOUS | Status: AC | PRN
  Administered 2018-07-21: 80 mL via INTRAVENOUS

## 2018-07-21 MED ORDER — PREDNISONE 20 MG PO TABS
20.0000 mg | ORAL_TABLET | Freq: Two times a day (BID) | ORAL | Status: DC
  Administered 2018-07-22: 20 mg via ORAL

## 2018-07-21 MED ORDER — IPRATROPIUM-ALBUTEROL 0.5-2.5 (3) MG/3ML IN SOLN
3.0000 mL | RESPIRATORY_TRACT | Status: DC
  Administered 2018-07-21 – 2018-07-22 (×8): 3 mL via RESPIRATORY_TRACT
  Filled 2018-07-21 (×8): qty 3

## 2018-07-21 MED ORDER — LORAZEPAM 0.5 MG PO TABS
0.5000 mg | ORAL_TABLET | Freq: Once | ORAL | Status: AC
  Administered 2018-07-21: 0.5 mg via ORAL
  Filled 2018-07-21: qty 1

## 2018-07-21 NOTE — Progress Notes (Signed)
Occupational Therapy Session Note  Patient Details  Name: CHELSEY KIMBERLEY MRN: [id number] Date of Birth: April 12, 1927  Today's Date: 07/21/2018 OT Individual Time: 1445-1530 OT Individual Time Calculation (min): 45 min    Short Term Goals: Week 1:  OT Short Term Goal 1 (Week 1): Pt will complete toilet transfer with max A  OT Short Term Goal 2 (Week 1): Pt will complete sit<>stand with max A in preparation for BADL tasks OT Short Term Goal 3 (Week 1): Pt will tolerate sitting EOB for 4 mins to increase endurance for BADL tasks  Skilled Therapeutic Interventions/Progress Updates:    Patient in bed, states that he is tired but willing to try activities in therapy.  Coughing persists in bed with HOB elevated.  Patient completes bed mobility with mod A.  Unsupported sitting at edge of bed with bilateral UEs resting on bedside table to promote upright posture for pulmonary support - tolerated for 15 minutes without episode of coughing.  Patient returned to supine position for bladder scan and to use the urinal briefly.  Completed another 20 minutes of SSP edge of bed with kicking ball activity - he was able to kick with bilateral LEs with good force and accuracy - increased smiles and noted improved postural control - less leaning to the right when kicking with right leg.  Standing with support of R LE max A of one with stand by of 2nd for 20-30 seconds.  Patient returned to supine position in bed with HOB elevated and call bell/tray table/suction in reach  Therapy Documentation Precautions:  Precautions Precautions: Fall Precaution Comments: bruising along R side of neck Restrictions Weight Bearing Restrictions: No General:   Vital Signs:  Pain: Pain Assessment Pain Scale: 0-10 Pain Score: 0-No pain   Other Treatments:     Therapy/Group: Individual Therapy  Carlos Levering 07/21/2018, 4:00 PM

## 2018-07-21 NOTE — Progress Notes (Signed)
Updated MD re: pt's condition [id number] using abdominal muscles to breath but not new for patient; patient comfortable at this time; sats 98-100at 2L.

## 2018-07-21 NOTE — Progress Notes (Signed)
[place] PHYSICAL MEDICINE & REHABILITATION PROGRESS NOTE  Subjective/Complaints: Pt with shortness of breath overnight. Called by RN, see her notes. Pt anxious also. Still restless and anxious this morning. Denies frank SOB  ROS: Limited due to cognitive/behavioral   Objective: Vital Signs: Blood pressure 103/63, pulse 86, temperature 97.7 F (36.5 C), temperature source Oral, resp. rate (!) 23, height 5\' 5"  (1.651 m), weight 79.8 kg, SpO2 93 %. Dg Chest 2 View  Result Date: 07/19/2018 CLINICAL DATA:  85 year old male with a history of shortness of breath and fatigue EXAM: CHEST - 2 VIEW COMPARISON:  07/16/2018 FINDINGS: Cardiomediastinal silhouette unchanged in size and contour with cardiomegaly. Surgical changes of median sternotomy. Low lung volumes with early vascular congestion and interlobular septal thickening. No pneumothorax. No displaced fracture. Blunting of the right costophrenic angle and left costophrenic angle. IMPRESSION: Low lung volumes, with evidence of atelectasis/scarring and/or early pulmonary edema. Median sternotomy and CABG Electronically Signed   By: Corrie Mckusick D.O.   On: 07/19/2018 12:25   Recent Labs    07/20/18 0629  WBC 10.4  HGB 11.0*  HCT 33.4*  PLT 128*   Recent Labs    07/20/18 0629  NA 132*  K 4.1  CL 100  CO2 20*  GLUCOSE 130*  BUN 31*  CREATININE 1.64*  CALCIUM 8.8*    Physical Exam: BP 103/63 (BP Location: Left Arm)   Pulse 86   Temp 97.7 F (36.5 C) (Oral)   Resp (!) 23   Ht 5\' 5"  (1.651 m)   Wt 79.8 kg   SpO2 93%   BMI 29.29 kg/m  Constitutional: No distress . Vital signs reviewed. HEENT: EOMI, oral membranes moist Neck: supple Cardiovascular: RRR without murmur. No JVD    Respiratory: decreased air sounds, unlabored, no tachypnea    GI: BS +, non-tender, non-distended  Musc: No edema or tenderness in extremities. Neurological:  He isalert. Extremely HOH.  Motor:  RUE: Shoulder abduction 4-/5, elbow  flexion/extension 4--4/5, handgrip 4--4/5, stable Right lower extremity: Hip flexion, knee extension 3+-4-/5, ankle dorsiflexion 3/5 Skin: Right neck with ecchymosis and hard indurated area under chin.  Psychiatric: anxious, restless, HOH doesn't help  Assessment/Plan: 1. 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  Physiatrist is providing close team supervision and 24 hour management of active medical problems listed below.  Physiatrist and rehab team continue to assess barriers to discharge/monitor patient progress toward functional and medical goals  Care Tool:  Bathing    Body parts bathed by patient: Left arm, Chest, Abdomen, Front perineal area   Body parts bathed by helper: Right arm, Left arm, Chest, Abdomen, Front perineal area, Right lower leg, Left upper leg, Right upper leg, Buttocks, Left lower leg, Face     Bathing assist Assist Level: Total Assistance - Patient < 25%     Upper Body Dressing/Undressing Upper body dressing Upper body dressing/undressing activity did not occur (including orthotics): N/A What is the patient wearing?: Pull over shirt    Upper body assist Assist Level: Maximal Assistance - Patient 25 - 49%    Lower Body Dressing/Undressing Lower body dressing    Lower body dressing activity did not occur: N/A What is the patient wearing?: Incontinence brief, Pants     Lower body assist Assist for lower body dressing: Total Assistance - Patient < 25%     Toileting Toileting Toileting Activity did not occur Landscape architect and  hygiene only): N/A (no void or bm)  Toileting assist Assist for toileting: Maximal Assistance - Patient 25 - 49%     Transfers Chair/bed transfer  Transfers assist  Chair/bed transfer activity did not occur: Safety/medical concerns  Chair/bed transfer assist level: Maximal Assistance - Patient 25  - 49%     Locomotion Ambulation   Ambulation assist   Ambulation activity did not occur: Safety/medical concerns          Walk 10 feet activity   Assist  Walk 10 feet activity did not occur: Safety/medical concerns        Walk 50 feet activity   Assist Walk 50 feet with 2 turns activity did not occur: Safety/medical concerns         Walk 150 feet activity   Assist Walk 150 feet activity did not occur: Safety/medical concerns         Walk 10 feet on uneven surface  activity   Assist Walk 10 feet on uneven surfaces activity did not occur: Safety/medical concerns         Wheelchair     Assist Will patient use wheelchair at discharge?: Yes Type of Wheelchair: Manual    Wheelchair assist level: Dependent - Patient 0%      Wheelchair 50 feet with 2 turns activity    Assist        Assist Level: Dependent - Patient 0%   Wheelchair 150 feet activity     Assist     Assist Level: Dependent - Patient 0%      Medical Problem List and Plan: 1.Functional and cognitive 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  Cont CIR   Plan for CT ~4/21 and resume ASA (no other anticoagulation) if improving per Neuro 2. Antithrombotics: -PE/LLEDVT/anticoagulation:None due to [place] -antiplatelet therapy: N/A 3. Pain Management:tylenol prn. 4. Mood:LCSW to follow for evaluation and support. -antipsychotic agents: N/A.   -Continue ativan tid as at home and Prozac. 5. Neuropsych: This patientis not fullycapable of making decisions onhisown behalf. 6. Skin/Wound Care:Monitor right neck hematoma for resolution. 7. Fluids/Electrolytes/Nutrition:Monitor I/O.  8.Chronic thrombocytopenia/ ABLA: Continue to monitor H/H/Platelets with serial checks. Monitor for signs of bleeding.   Hemoglobin 11.0 on 4/17  Platelets 128 on 4/17  Labs ordered for Monday  Continue to monitor 9. Right forearm wound/ recent  cellulitis:Completed course of Keflex on 4/16. Added Aqacell to wound as per derm records. 10.CKD stage III: Avoid nephrotoxic medications. Monitor with serial checks.   Creatinine 1.64 on 4/17, likely from IV diuretics given yesterday  Encourage fluids, will consider IVF if necessary  Labs pending for Monday  Continue to monitor 11. H/o A fib: Monitor HR bid. Continue metoprolol bid--off Lovenox.  12. H/o Depression: On Prozac daily with ativan tid.  36. H/o Lung cancer: Had PE on Eliquis. No antiplatelets or anticoagulation for now.              Scheduled and prn nebs  Continue Pulmonary toilet   4/16 CXR reviewed again, low volumes, ?early edema, atelectasis  -given last night's events, will go ahead and recheck  Continue supplemental oxygen  -anxiety control 14. Labile blood pressure  Well controlled 4/18 15. Hyperglycemia  Labile on 4/17 16. Leukocytosis: Resolved  WBCs 10.4 on 4/17  Afebrile  UA negative, urine culture not collected--no symptoms---will not pursue 17.  Hyponatremia  Sodium 132 on 4/17, likely from IV diuretics given    Labs ordered for Monday  LOS: 5 days A FACE TO FACE EVALUATION WAS  PERFORMED  Meredith Staggers 07/21/2018, 8:42 AM

## 2018-07-21 NOTE — Progress Notes (Signed)
Occupational Therapy Session Note  Patient Details  Name: Evan [name] MRN: [id number] Date of Birth: 15-Jan-1928  Today's Date: 07/21/2018 OT Individual Time: 1100-1200 OT Individual Time Calculation (min): 60 min    Short Term Goals: Week 1:  OT Short Term Goal 1 (Week 1): Pt will complete toilet transfer with max A  OT Short Term Goal 2 (Week 1): Pt will complete sit<>stand with max A in preparation for BADL tasks OT Short Term Goal 3 (Week 1): Pt will tolerate sitting EOB for 4 mins to increase endurance for BADL tasks  Skilled Therapeutic Interventions/Progress Updates:    Patient in bed, on 2L O2 via [place].  O2 sat checked t/o session with unsupported sitting and supine activities - ranged from 96% - 99% with HR in the 70's, moderate coughing noted at times.  He is pleasant and alert t/o session.  He states that he is fatigued but will try to participate.  LB dressing in supine position - max A, patient able to roll today with min/mod A.  Supine to SSP with mod A.  He is able to tolerate unsupported sitting for 20 minutes - completed UB dressing mod A in this position today.  He completed ball toss activity with increased use of right UE.  Patient able to sit with CG A and cues for upright posture, he can self correct lateral lean to right when he is not engaged in activity.  During ball toss he requires mod A to maintain seated balance with significant lean to the right.  Patient returned to bed.  He requires max A to use urinal at his request.  He received a phone call at close of session, remained in bed, with call bell and tray table in reach  Therapy Documentation Precautions:  Precautions Precautions: Fall Precaution Comments: bruising along R side of neck Restrictions Weight Bearing Restrictions: No General: General PT Missed Treatment Reason: Patient fatigue;Other (Comment)(SOB) Vital Signs:  Pain: Pain Assessment Pain Scale: 0-10 Pain Score: 0-No pain Other Treatments:      Therapy/Group: Individual Therapy  Carlos Levering 07/21/2018, 12:12 PM

## 2018-07-21 NOTE — Progress Notes (Signed)
Labs and radiographs reviewed. CXR actually show slight improvement from 4/16. ABG notable for partially compensated respiratory alkalosis although he is hypoxic (62). He has had a history of PE in November 2019 involving the peripheral lower lobe branch of right pulm artery. Has been off anticoagulation d/t thalamic hemorrhage. Pt seems to be more comfortable at present but given the above will order CTA of chest. Called and spoke to daughter who is aware.   Meredith Staggers, MD, [facility] 07/21/2018

## 2018-07-21 NOTE — Progress Notes (Signed)
Pt complained of shortness of breath at rest. RR was 36, O2 sat 98/RA, + expiratory wheezing bilateral upper lobes, diminished at bases. Respiratory therapist informed and requested RN to administer Duoneb at she was tied up in ICU. Neb administered but patient stated he is still short of breath with RR still 36. IS offered and did 750 ml x 10. Left upper lobe with faint wheezing, R lung diminished breath sounds. Respiratory therapist came to bedside and put pt on 2L/[place] oxygen. Dr. Naaman Plummer informed with orders.

## 2018-07-21 NOTE — Progress Notes (Signed)
SLP Cancellation Note  Patient Details Name: Evan [name] MRN: [id number] DOB: 07-Jan-1928   Cancelled treatment:  Attempted to see patient for skilled SLP services. Despite encouragement to participate patient adamantly refused stating "I aint doing no speech today."                                     Cristy Folks, MS, CCC-SLP 07/21/2018, 2:19 PM

## 2018-07-21 NOTE — Progress Notes (Signed)
Physical Therapy Session Note  Patient Details  Name: Evan [name] MRN: [id number] Date of Birth: 13-Aug-1927  Today's Date: 07/21/2018 PT Individual Time: 0915-1005 PT Individual Time Calculation (min): 50 min   Short Term Goals: Week 1:  PT Short Term Goal 1 (Week 1): Pt will initiate gait training PT Short Term Goal 2 (Week 1): Pt will initiate w/c mobility training PT Short Term Goal 3 (Week 1): Pt will transfer bed<>chair w/ mod assist PT Short Term Goal 4 (Week 1): Pt will attend to R side during functional mobility w/o cues 50% of the time  Skilled Therapeutic Interventions/Progress Updates:  Pt was seen bedside in the am. Pt with 2 liters O2 on. Monitored O2 sats off and on throughout treatment, O2 sats greater than 93% throughout. Increased work of breathing noted with any activity. Pt HOH. Pt transferred supine to edge of bed and edge of bed to supine with mod A and verbal cues. Pt tolerated edge of bed about 30 minutes with close supervision to min A. Pt tends to lean to the R with verbal and tactile cues to correct. Pt transferred sit to stand with max A. Attempted stedy with +2 assist however unable to stand secondary to increased work of breathing. While on edge of bed worked on upright posture, weight shifting and LE exercises as tolerated. Pt returned to supine. Pt moved up in bed with bed controls and total A. Pt left sitting up in bed with call within reach.   Therapy Documentation Precautions:  Precautions Precautions: Fall Precaution Comments: bruising along R side of neck Restrictions Weight Bearing Restrictions: No General: PT Amount of Missed Time (min): 10 Minutes PT Missed Treatment Reason: Patient fatigue;Other (Comment)(SOB) Vital Signs:  Pain: No c/o pain.   Therapy/Group: Individual Therapy  Dub Amis 07/21/2018, 10:08 AM

## 2018-07-22 LAB — BASIC METABOLIC PANEL
Anion gap: 10 (ref 5–15)
BUN: 31 mg/dL — ABNORMAL HIGH (ref 8–23)
CO2: 21 mmol/L — ABNORMAL LOW (ref 22–32)
Calcium: 8.9 mg/dL (ref 8.9–10.3)
Chloride: 101 mmol/L (ref 98–111)
Creatinine, Ser: 1.28 mg/dL — ABNORMAL HIGH (ref 0.61–1.24)
GFR calc Af Amer: 58 mL/min — ABNORMAL LOW (ref >60)
GFR calc non Af Amer: 50 mL/min — ABNORMAL LOW (ref >60)
Glucose, Bld: 175 mg/dL — ABNORMAL HIGH (ref 70–99)
Potassium: 4.8 mmol/L (ref 3.5–5.1)
Sodium: 132 mmol/L — ABNORMAL LOW (ref 135–145)

## 2018-07-22 LAB — URINE CULTURE: Culture: NO GROWTH

## 2018-07-22 MED ORDER — PREDNISONE 10 MG PO TABS
10.0000 mg | ORAL_TABLET | Freq: Two times a day (BID) | ORAL | Status: DC
  Administered 2018-07-22 – 2018-07-24 (×4): 10 mg via ORAL
  Filled 2018-07-22 (×5): qty 1

## 2018-07-22 MED ORDER — IPRATROPIUM-ALBUTEROL 0.5-2.5 (3) MG/3ML IN SOLN
3.0000 mL | Freq: Three times a day (TID) | RESPIRATORY_TRACT | Status: DC
  Administered 2018-07-23: 3 mL via RESPIRATORY_TRACT
  Filled 2018-07-22: qty 3

## 2018-07-22 MED ORDER — GUAIFENESIN-DM 100-10 MG/5ML PO SYRP
10.0000 mL | ORAL_SOLUTION | Freq: Four times a day (QID) | ORAL | Status: DC | PRN
  Administered 2018-07-22: 10 mL via ORAL
  Filled 2018-07-22: qty 10

## 2018-07-22 MED ORDER — DM-GUAIFENESIN ER 30-600 MG PO TB12
1.0000 | ORAL_TABLET | Freq: Two times a day (BID) | ORAL | Status: DC
  Administered 2018-07-22 – 2018-08-15 (×49): 1 via ORAL
  Filled 2018-07-22 (×52): qty 1

## 2018-07-22 NOTE — Plan of Care (Signed)
  Problem: RH BLADDER ELIMINATION Goal: RH STG MANAGE BLADDER WITH ASSISTANCE Description STG Manage Bladder With Min  Assistance  Outcome: Not Progressing; incontinence ; I and O cath

## 2018-07-22 NOTE — Progress Notes (Signed)
Patient resting good today; able to eat breakfast; daughter updated.

## 2018-07-22 NOTE — Progress Notes (Signed)
[place] PHYSICAL MEDICINE & REHABILITATION PROGRESS NOTE  Subjective/Complaints: Pt slept very well per RN. Was sleeping when I arrived. Awoke easily. States that breathing is better. 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 rate (!) 22, height 5\' 5"  (1.651 m), weight 79.8 kg, SpO2 96 %. Ct Angio Chest Pe W Or Wo Contrast  Result Date: 07/21/2018 CLINICAL DATA:  Shortness of breath. Clinical concern for pulmonary embolus. EXAM: CT ANGIOGRAPHY CHEST WITH CONTRAST TECHNIQUE: Multidetector CT imaging of the chest was performed using the standard protocol during bolus administration of intravenous contrast. Multiplanar CT image reconstructions and MIPs were obtained to evaluate the vascular anatomy. CONTRAST:  14mL OMNIPAQUE IOHEXOL 350 MG/ML SOLN COMPARISON:  06/01/2018 FINDINGS: Cardiovascular: The heart size is normal. No substantial pericardial effusion. Coronary artery calcification is evident. Atherosclerotic calcification is noted in the wall of the thoracic aorta. No filling defects in the opacified pulmonary arteries to suggest the presence of an acute pulmonary embolus. Mediastinum/Nodes: 9 mm short axis lymph node measured in the region of the right thoracic inlet previously is 10 mm today. 10 mm short axis subcarinal lymph node is stable. Abnormal soft tissue in the inferior right hilum was not well demonstrated on previous noncontrast exam but appears progressive when comparing to 02/02/2018. No evidence for left hilar lymphadenopathy. Lungs/Pleura: Secretions/debris noted in the lower trachea and right mainstem bronchus. Patchy ground-glass attenuation in the right midlung this similar to prior. Posterior right lower lobe collapse/consolidation is mildly progressive in the interval. Atelectatic changes noted posterior left lower lobe. Tiny  perifissural nodule in the left lung (62/8) is stable. Tiny right pleural effusion evident. Upper Abdomen: Unremarkable. Musculoskeletal: No worrisome lytic or sclerotic osseous abnormality. Review of the MIP images confirms the above findings. IMPRESSION: 1. No CT evidence for acute pulmonary embolus. 2. Interval progression of abnormal soft tissue in the inferior right hilum. Recurrent disease a concern. 3. Similar to mild progression of posterior right lower lobe collapse/consolidative change, likely radiation scarring. 4. Stable mediastinal lymph nodes. 5.  Aortic Atherosclerois (ICD10-170.0) Electronically Signed   By: Misty Stanley M.D.   On: 07/21/2018 19:51   Dg Chest Port 1 View  Result Date: 07/21/2018 CLINICAL DATA:  Shortness of breath. EXAM: PORTABLE CHEST 1 VIEW COMPARISON:  07/19/2018 FINDINGS: Prior sternotomy is again noted. The cardiac silhouette remains mildly enlarged. Aortic atherosclerosis is noted. The lungs are slightly better inflated than on the prior study. Pulmonary vascular congestion and mild bilateral interstitial opacities are mildly improved. There is persistent blunting of the costophrenic angles bilaterally which may reflect small pleural effusions. No pneumothorax is identified. IMPRESSION: Mild improvement of pulmonary vascular congestion/mild edema. Electronically Signed   By: Logan Bores M.D.   On: 07/21/2018 11:09   Recent Labs    07/20/18 0629 07/21/18 1454  WBC 10.4 9.6  HGB 11.0* 10.8*  HCT 33.4* 33.8*  PLT 128* 131*   Recent Labs    07/21/18 1454 07/22/18 0637  NA 132* 132*  K 4.4 4.8  CL 99 101  CO2 21* 21*  GLUCOSE 133* 175*  BUN 33* 31*  CREATININE 1.63* 1.28*  CALCIUM 8.9 8.9    Physical Exam: BP (!) 122/56 (BP Location: Left Arm)   Pulse 80   Temp 98.6 F (37 C) (Oral)   Resp (!) 22   Ht 5\' 5"  (1.651 m)   Wt 79.8  kg   SpO2 96%   BMI 29.29 kg/m  Constitutional: No distress . Vital signs reviewed. HEENT: EOMI, oral membranes  moist Neck: supple Cardiovascular: RRR without murmur. No JVD    Respiratory:scattered rhonchi, no wheezing, RR around 20-22, unlabored   GI: BS +, non-tender, non-distended  Musc: No edema or tenderness in extremities. Neurological:  He isalert. Extremely HOH.  Motor:  RUE: Shoulder abduction 4-/5, elbow flexion/extension 4--4/5, handgrip 4--4/5, stable Right lower extremity: Hip flexion, knee extension 3+-4-/5, ankle dorsiflexion 3/5 Skin: Right neck with ecchymosis and hard indurated area under chin.  Psychiatric: calm, cooperative   Assessment/Plan: 1. 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  Physiatrist is providing close team supervision and 24 hour management of active medical problems listed below.  Physiatrist and rehab team continue to assess barriers to discharge/monitor patient progress toward functional and medical goals  Care Tool:  Bathing    Body parts bathed by patient: Left arm, Chest, Abdomen, Front perineal area   Body parts bathed by helper: Right arm, Left arm, Chest, Abdomen, Front perineal area, Right lower leg, Left upper leg, Right upper leg, Buttocks, Left lower leg, Face     Bathing assist Assist Level: Total Assistance - Patient < 25%     Upper Body Dressing/Undressing Upper body dressing Upper body dressing/undressing activity did not occur (including orthotics): N/A What is the patient wearing?: Pull over shirt    Upper body assist Assist Level: Moderate Assistance - Patient 50 - 74%    Lower Body Dressing/Undressing Lower body dressing    Lower body dressing activity did not occur: N/A What is the patient wearing?: Incontinence brief, Pants     Lower body assist Assist for lower body dressing: Total Assistance - Patient < 25%     Toileting Toileting Toileting Activity did not occur Landscape architect and  hygiene only): N/A (no void or bm)  Toileting assist Assist for toileting: Maximal Assistance - Patient 25 - 49%     Transfers Chair/bed transfer  Transfers assist  Chair/bed transfer activity did not occur: Safety/medical concerns  Chair/bed transfer assist level: Maximal Assistance - Patient 25 - 49%     Locomotion Ambulation   Ambulation assist   Ambulation activity did not occur: Safety/medical concerns          Walk 10 feet activity   Assist  Walk 10 feet activity did not occur: Safety/medical concerns        Walk 50 feet activity   Assist Walk 50 feet with 2 turns activity did not occur: Safety/medical concerns         Walk 150 feet activity   Assist Walk 150 feet activity did not occur: Safety/medical concerns         Walk 10 feet on uneven surface  activity   Assist Walk 10 feet on uneven surfaces activity did not occur: Safety/medical concerns         Wheelchair     Assist Will patient use wheelchair at discharge?: Yes Type of Wheelchair: Manual    Wheelchair assist level: Dependent - Patient 0%      Wheelchair 50 feet with 2 turns activity    Assist        Assist Level: Dependent - Patient 0%   Wheelchair 150 feet activity     Assist     Assist Level: Dependent - Patient 0%      Medical  Problem List and Plan: 1.Functional and cognitive 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  Cont CIR   Plan for CT ~4/21 and resume ASA (no other anticoagulation) if improving per Neuro 2. Antithrombotics: -PE/LLEDVT/anticoagulation:None due to [place] -antiplatelet therapy: N/A 3. Pain Management:tylenol prn. 4. Mood:LCSW to follow for evaluation and support. -antipsychotic agents: N/A.   -Continue ativan tid as at home and Prozac. 5. Neuropsych: This patientis not fullycapable of making decisions onhisown behalf. 6. Skin/Wound Care:Monitor right neck hematoma for  resolution. 7. Fluids/Electrolytes/Nutrition:Monitor I/O.  8.Chronic thrombocytopenia/ ABLA: Continue to monitor H/H/Platelets with serial checks. Monitor for signs of bleeding.   Hemoglobin 10.8 4/18  Platelets 131 4/18  Labs ordered for Monday  Continue to monitor 9. Right forearm wound/ recent cellulitis:Completed course of Keflex on 4/16. Added Aqacell to wound as per derm records. 10.CKD stage III: Avoid nephrotoxic medications. Monitor with serial checks.   Creatinine 1.64 on 4/17, improved to 31/1.28 today  Continue to monitor 11. H/o A fib: Monitor HR bid. Continue metoprolol bid--off Lovenox.  12. H/o Depression: On Prozac daily with ativan tid.  22. H/o Lung cancer: Had PE on Eliquis. No antiplatelets or anticoagulation for now.                  4/18 CXR with actual improvement  -given labored breathing yesterday and hypoxia on ABG, hx of PE/cancer, I ordered a CT angio of chest yesterday--negative for PE, demonstrated chronic changes/consolidation due to likely radiation scarring. Does demonstrate progression right hilar soft tissue also. This will need to be investigated on an outpt basis  -seems to have responded nicely to scheduled nebs and steroids   -decrease prednisone today  -add mucinex dm to help with cough/clearance  -don't see signs of volume overload, CT did not demonstrate either  -encourage IS/FV  -pulmonary toilet  -anxiety control  -spoke to daughter 3x yesterday via phone keeping her updated on status  14. Labile blood pressure  Well controlled 4/18 15. Hyperglycemia   -elevated with steroids on board 16. Leukocytosis: Resolved  WBCs 9.6 4/18  Afebrile  UA negative, urine culture not collected--no symptoms---will not pursue 17.  Hyponatremia  Sodium 132 on 4/18, likely from IV diuretics given    Labs ordered for Monday   35 min total time spent   LOS: 6 days A FACE TO FACE EVALUATION WAS PERFORMED  Meredith Staggers 07/22/2018, 8:31 AM

## 2018-07-23 ENCOUNTER — Inpatient Hospital Stay (HOSPITAL_COMMUNITY): Payer: Medicare HMO | Admitting: Speech Pathology

## 2018-07-23 ENCOUNTER — Inpatient Hospital Stay (HOSPITAL_COMMUNITY): Payer: Medicare HMO

## 2018-07-23 ENCOUNTER — Inpatient Hospital Stay (HOSPITAL_COMMUNITY): Payer: Medicare HMO | Admitting: Occupational Therapy

## 2018-07-23 ENCOUNTER — Inpatient Hospital Stay (HOSPITAL_COMMUNITY): Payer: Medicare HMO | Admitting: Physical Therapy

## 2018-07-23 LAB — CBC WITH DIFFERENTIAL/PLATELET
Abs Immature Granulocytes: 0.11 10*3/uL — ABNORMAL HIGH (ref 0.00–0.07)
Basophils Absolute: 0 10*3/uL (ref 0.0–0.1)
Basophils Relative: 0 %
Eosinophils Absolute: 0 10*3/uL (ref 0.0–0.5)
Eosinophils Relative: 0 %
HCT: 33.6 % — ABNORMAL LOW (ref 39.0–52.0)
Hemoglobin: 10.9 g/dL — ABNORMAL LOW (ref 13.0–17.0)
Immature Granulocytes: 1 %
Lymphocytes Relative: 8 %
Lymphs Abs: 1.1 10*3/uL (ref 0.7–4.0)
MCH: 30.6 pg (ref 26.0–34.0)
MCHC: 32.4 g/dL (ref 30.0–36.0)
MCV: 94.4 fL (ref 80.0–100.0)
Monocytes Absolute: 1.2 10*3/uL — ABNORMAL HIGH (ref 0.1–1.0)
Monocytes Relative: 8 %
Neutro Abs: 12.4 10*3/uL — ABNORMAL HIGH (ref 1.7–7.7)
Neutrophils Relative %: 83 %
Platelets: 179 10*3/uL (ref 150–400)
RBC: 3.56 MIL/uL — ABNORMAL LOW (ref 4.22–5.81)
RDW: 13.5 % (ref 11.5–15.5)
WBC: 14.9 10*3/uL — ABNORMAL HIGH (ref 4.0–10.5)
nRBC: 0 % (ref 0.0–0.2)

## 2018-07-23 LAB — BASIC METABOLIC PANEL
Anion gap: 13 (ref 5–15)
BUN: 41 mg/dL — ABNORMAL HIGH (ref 8–23)
CO2: 18 mmol/L — ABNORMAL LOW (ref 22–32)
Calcium: 9.2 mg/dL (ref 8.9–10.3)
Chloride: 102 mmol/L (ref 98–111)
Creatinine, Ser: 1.38 mg/dL — ABNORMAL HIGH (ref 0.61–1.24)
GFR calc Af Amer: 53 mL/min — ABNORMAL LOW (ref >60)
GFR calc non Af Amer: 45 mL/min — ABNORMAL LOW (ref >60)
Glucose, Bld: 123 mg/dL — ABNORMAL HIGH (ref 70–99)
Potassium: 4.5 mmol/L (ref 3.5–5.1)
Sodium: 133 mmol/L — ABNORMAL LOW (ref 135–145)

## 2018-07-23 MED ORDER — IPRATROPIUM-ALBUTEROL 0.5-2.5 (3) MG/3ML IN SOLN
3.0000 mL | Freq: Two times a day (BID) | RESPIRATORY_TRACT | Status: DC
  Administered 2018-07-23 – 2018-07-25 (×5): 3 mL via RESPIRATORY_TRACT
  Filled 2018-07-23 (×5): qty 3

## 2018-07-23 NOTE — Progress Notes (Signed)
[place] PHYSICAL MEDICINE & REHABILITATION PROGRESS NOTE  Subjective/Complaints: Patient seen sitting up in bed, working with SLP this morning.  He states he slept fairly overnight.  He states he had a rough weekend.  He had shortness of breath over the weekend.  ROS: Limited due to [facility]  Objective: Vital Signs: Blood pressure 130/74, pulse 78, temperature (!) 97.5 F (36.4 C), temperature source Oral, resp. rate (!) 26, height 5\' 5"  (1.651 m), weight 79.8 kg, SpO2 97 %. Ct Angio Chest Pe W Or Wo Contrast  Result Date: 07/21/2018 CLINICAL DATA:  Shortness of breath. Clinical concern for pulmonary embolus. EXAM: CT ANGIOGRAPHY CHEST WITH CONTRAST TECHNIQUE: Multidetector CT imaging of the chest was performed using the standard protocol during bolus administration of intravenous contrast. Multiplanar CT image reconstructions and MIPs were obtained to evaluate the vascular anatomy. CONTRAST:  10mL OMNIPAQUE IOHEXOL 350 MG/ML SOLN COMPARISON:  06/01/2018 FINDINGS: Cardiovascular: The heart size is normal. No substantial pericardial effusion. Coronary artery calcification is evident. Atherosclerotic calcification is noted in the wall of the thoracic aorta. No filling defects in the opacified pulmonary arteries to suggest the presence of an acute pulmonary embolus. Mediastinum/Nodes: 9 mm short axis lymph node measured in the region of the right thoracic inlet previously is 10 mm today. 10 mm short axis subcarinal lymph node is stable. Abnormal soft tissue in the inferior right hilum was not well demonstrated on previous noncontrast exam but appears progressive when comparing to 02/02/2018. No evidence for left hilar lymphadenopathy. Lungs/Pleura: Secretions/debris noted in the lower trachea and right mainstem bronchus. Patchy ground-glass attenuation in the right midlung this similar to prior. Posterior right lower lobe collapse/consolidation is mildly progressive in the interval. Atelectatic changes  noted posterior left lower lobe. Tiny perifissural nodule in the left lung (62/8) is stable. Tiny right pleural effusion evident. Upper Abdomen: Unremarkable. Musculoskeletal: No worrisome lytic or sclerotic osseous abnormality. Review of the MIP images confirms the above findings. IMPRESSION: 1. No CT evidence for acute pulmonary embolus. 2. Interval progression of abnormal soft tissue in the inferior right hilum. Recurrent disease a concern. 3. Similar to mild progression of posterior right lower lobe collapse/consolidative change, likely radiation scarring. 4. Stable mediastinal lymph nodes. 5.  Aortic Atherosclerois (ICD10-170.0) Electronically Signed   By: Misty Stanley M.D.   On: 07/21/2018 19:51   Dg Chest Port 1 View  Result Date: 07/21/2018 CLINICAL DATA:  Shortness of breath. EXAM: PORTABLE CHEST 1 VIEW COMPARISON:  07/19/2018 FINDINGS: Prior sternotomy is again noted. The cardiac silhouette remains mildly enlarged. Aortic atherosclerosis is noted. The lungs are slightly better inflated than on the prior study. Pulmonary vascular congestion and mild bilateral interstitial opacities are mildly improved. There is persistent blunting of the costophrenic angles bilaterally which may reflect small pleural effusions. No pneumothorax is identified. IMPRESSION: Mild improvement of pulmonary vascular congestion/mild edema. Electronically Signed   By: Logan Bores M.D.   On: 07/21/2018 11:09   Recent Labs    07/21/18 1454  WBC 9.6  HGB 10.8*  HCT 33.8*  PLT 131*   Recent Labs    07/21/18 1454 07/22/18 0637  NA 132* 132*  K 4.4 4.8  CL 99 101  CO2 21* 21*  GLUCOSE 133* 175*  BUN 33* 31*  CREATININE 1.63* 1.28*  CALCIUM 8.9 8.9    Physical Exam: BP 130/74 (BP Location: Left Arm)   Pulse 78   Temp (!) 97.5 F (36.4 C) (Oral)   Resp (!) 26   Ht 5'  5" (1.651 m)   Wt 79.8 kg   SpO2 97%   BMI 29.29 kg/m  Constitutional: No distress . Vital signs reviewed. HENT: Normocephalic.   Atraumatic. Eyes: EOMI. No discharge. Cardiovascular: No JVD. Respiratory: Normal effort.  + Katie. GI: Non-distended. Musc: No edema or tenderness in extremities. Neurological:  He isalert. Extremely HOH.  Motor:  RUE: Shoulder abduction 4-/5, elbow flexion/extension 4-/5, handgrip 4-/5 Right lower extremity: Hip flexion, knee extension 3+/5, ankle dorsiflexion 3/5 Skin: Right neck with ecchymosis and hard indurated area under chin, improving.  Psychiatric: Appears to have normal behavior and affect.   Assessment/Plan: 1. 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  Physiatrist is providing close team supervision and 24 hour management of active medical problems listed below.  Physiatrist and rehab team continue to assess barriers to discharge/monitor patient progress toward functional and medical goals  Care Tool:  Bathing    Body parts bathed by patient: Left arm, Chest, Abdomen, Front perineal area   Body parts bathed by helper: Right arm, Left arm, Chest, Abdomen, Front perineal area, Right lower leg, Left upper leg, Right upper leg, Buttocks, Left lower leg, Face     Bathing assist Assist Level: Total Assistance - Patient < 25%     Upper Body Dressing/Undressing Upper body dressing Upper body dressing/undressing activity did not occur (including orthotics): N/A What is the patient wearing?: Pull over shirt    Upper body assist Assist Level: Moderate Assistance - Patient 50 - 74%    Lower Body Dressing/Undressing Lower body dressing    Lower body dressing activity did not occur: N/A What is the patient wearing?: Incontinence brief, Pants     Lower body assist Assist for lower body dressing: Total Assistance - Patient < 25%     Toileting Toileting Toileting Activity did not occur Landscape architect and hygiene only): N/A (no void or bm)   Toileting assist Assist for toileting: Maximal Assistance - Patient 25 - 49%     Transfers Chair/bed transfer  Transfers assist  Chair/bed transfer activity did not occur: Safety/medical concerns  Chair/bed transfer assist level: Maximal Assistance - Patient 25 - 49%     Locomotion Ambulation   Ambulation assist   Ambulation activity did not occur: Safety/medical concerns          Walk 10 feet activity   Assist  Walk 10 feet activity did not occur: Safety/medical concerns        Walk 50 feet activity   Assist Walk 50 feet with 2 turns activity did not occur: Safety/medical concerns         Walk 150 feet activity   Assist Walk 150 feet activity did not occur: Safety/medical concerns         Walk 10 feet on uneven surface  activity   Assist Walk 10 feet on uneven surfaces activity did not occur: Safety/medical concerns         Wheelchair     Assist Will patient use wheelchair at discharge?: Yes Type of Wheelchair: Manual    Wheelchair assist level: Dependent - Patient 0%      Wheelchair 50 feet with 2 turns activity    Assist        Assist Level: Dependent - Patient 0%   Wheelchair 150 feet activity     Assist     Assist Level: Dependent - Patient 0%      Medical Problem  List and Plan: 1.Functional and cognitive 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  Cont CIR  Weekend notes reviewed, discussed with covering physician- shortness of breath with scheduled nebs and steroids ordered.  Plan for CT ~4/21 and resume ASA (no other anticoagulation) if improving per Neuro 2. Antithrombotics: -PE/LLEDVT/anticoagulation:None due to [place] -antiplatelet therapy: N/A 3. Pain Management:tylenol prn. 4. Mood:LCSW to follow for evaluation and support. -antipsychotic agents: N/A.   -Continue ativan tid as at home and Prozac. 5. Neuropsych: This patientis not fullycapable of making  decisions onhisown behalf. 6. Skin/Wound Care:Monitor right neck hematoma for resolution. 7. Fluids/Electrolytes/Nutrition:Monitor I/O.  8.Chronic thrombocytopenia/ ABLA: Continue to monitor H/H/Platelets with serial checks. Monitor for signs of bleeding.   Hemoglobin 10.8 4/18  Platelets 131 4/18  Labs pending  Continue to monitor 9. Right forearm wound/ recent cellulitis:Completed course of Keflex on 4/16. Added Aqacell to wound as per derm records. 10.CKD stage III: Avoid nephrotoxic medications. Monitor with serial checks.   Creatinine 1.28 on 4/19  Continue to monitor 11. H/o A fib: Monitor HR bid. Continue metoprolol bid--off Lovenox.  12. H/o Depression: On Prozac daily with ativan tid.  61. H/o Lung cancer: Had PE on Eliquis. No antiplatelets or anticoagulation for now.   4/18 CXR reviewed, suggesting improvement  CTA ordered on 4/18- PE resolved, progression of right hilum abnormality, chronic radiation scarring  Continue steroids for now  Added Mucinex dm to help with cough/clearance  -encourage IS/FV  -pulmonary toilet  -anxiety control  14. Labile blood pressure  Relatively controlled on 4/20 15.  Steroid-induced hyperglycemia  Monitor with steroids 16. Leukocytosis: Resolved  WBCs 9.6 4/18  Afebrile  UA negative, urine culture not collected--no symptoms---will not pursue 17.  Hyponatremia  Sodium 132 on 4/19, likely from IV diuretics given    Labs pending  LOS: 7 days A FACE TO FACE EVALUATION WAS PERFORMED  Jacquelyne Quarry Lorie Phenix 07/23/2018, 8:37 AM

## 2018-07-23 NOTE — Progress Notes (Addendum)
Up in chair. Looks better --not as fatigued appearing this am after therapy. Asking to get back in bed. No WOB with conversation. On exam, lungs are without wheezes and aerating better bilaterally.   Addendum: Reviewed CT head with Dr. Erlinda Hong. Blood minimally absorbed but he feels it should improve in 2 weeks. Will order follow up CT for 4/27 am and have neuro review films to help make decision regarding anticoagulation pior to discharge.

## 2018-07-23 NOTE — Progress Notes (Signed)
Physical Therapy Session Note  Patient Details  Name: Evan [name] MRN: [id number] Date of Birth: May 08, 1927  Today's Date: 07/23/2018 PT Individual Time: 1135-1230 PT Individual Time Calculation (min): 55 min   Short Term Goals: Week 1:  PT Short Term Goal 1 (Week 1): Pt will initiate gait training PT Short Term Goal 2 (Week 1): Pt will initiate w/c mobility training PT Short Term Goal 3 (Week 1): Pt will transfer bed<>chair w/ mod assist PT Short Term Goal 4 (Week 1): Pt will attend to R side during functional mobility w/o cues 50% of the time  Skilled Therapeutic Interventions/Progress Updates:   Pt in supine and agreeable to therapy, no c/o pain. Supine>sit transfer w/ min assist and maintain static sitting balance w/ CGA w/ frequent verbal reminders. Total assist to void in urinal, continent of urine. Stedy transfer w/ min assist sit<>stands and mod assist to maintain upright posture in stedy. Total assist w/c transport to/from therapy gym. Worked on sit<>stands to/from [employer] w/ mirror visual feedback for midline orientation, RLE placement, and for upright posture. Pt responds well to visual feedback, needs only min-mod assist to maintain standing for 10-20 sec at a time. R UE splint placed on RW w/ decrease assist needed w/ RUE management. Performed 6-7 stands in total. Needed prolonged seated rest breaks 2/2 fatigue and moderate increase in work of breathing during all activity. No SOB at baseline today, much improved from last session with this therapist. Returned to room and ended session in supine, all needs in reach. Pt in modified chair position in bed to allow for improved airflow and decreased accessory muscle use to breathe at rest.   Therapy Documentation Precautions:  Precautions Precautions: Fall Precaution Comments: bruising along R side of neck Restrictions Weight Bearing Restrictions: No Vital Signs: Therapy Vitals Pulse Rate: 72 Resp: 20 BP: 123/69 Oxygen  Therapy SpO2: 98 % O2 Device: Room Air Pain: Pain Assessment Pain Score: 0-No pain  Therapy/Group: Individual Therapy  Roshawn Lacina K Aws Shere 07/23/2018, 1:10 PM

## 2018-07-23 NOTE — Progress Notes (Signed)
Physical Therapy Session Note  Patient Details  Name: Evan [name] MRN: [id number] Date of Birth: 01-07-1928  Today's Date: 07/23/2018 PT Individual Time: [phone] PT Individual Time Calculation (min): 37 min   Short Term Goals: Week 1:  PT Short Term Goal 1 (Week 1): Pt will initiate gait training PT Short Term Goal 2 (Week 1): Pt will initiate w/c mobility training PT Short Term Goal 3 (Week 1): Pt will transfer bed<>chair w/ mod assist PT Short Term Goal 4 (Week 1): Pt will attend to R side during functional mobility w/o cues 50% of the time  Skilled Therapeutic Interventions/Progress Updates:    Patient in supine, very HOH, but cooperative.  Performed in bed therex for strength, coordination, NMR R LE to include heel slides with A for control, AP's, bridging, hooklying hip abd/adduction with A & cues for control, hip/knee flexion moving leg in/out cues and assist for controlled movement all x 10 reps.  Supine to sit min to mod A for R LE/trunk.  Patient seated EOB with close S cues for L lateral lean A for scooting R side to EOB safely.  Seated to don shorts with max A and shoes total A.  Patient sit to stand to RW to pull up pants mod A progressing to total A due to R lateral lean and unable to correct safely so returned to seated EOB.  Sit to supine min A for R LE and total A to finish donning shorts.  Supine to sit min/mod A.  Patient transferred to w/c with mod to max A toward R side with max cues for safety, stand to sit on edge of chair due to incoordination with R side and chair tipping back.  Patient able to scoot L side back in chair and A for R side.  RN in early in session reports plans to wean off O2.  Removed early in session due to SpO2 97%.  Spot check throughout session and SpO2 remaining 94% or greater.  Continues with SOB with activity and about 1-2 minutes to recover.  Patient left up in w/c with seat alarm and RN in room, call bell and half lap tray in place.    Therapy  Documentation Precautions:  Precautions Precautions: Fall Precaution Comments: bruising along R side of neck Restrictions Weight Bearing Restrictions: No Pain: Pain Assessment Pain Score: 0-No pain    Therapy/Group: Individual Therapy  Reginia Naas  Magda Kiel, PT 07/23/2018, 9:37 AM

## 2018-07-23 NOTE — Progress Notes (Signed)
Speech Language Pathology Daily Session Notes  Patient Details  Name: Evan [name] MRN: [id number] Date of Birth: [birth year]  Today's Date: 07/23/2018  Session 1: SLP Individual Time: 0725-0800 SLP Individual Time Calculation (min): 35 min   Session 2: SLP Individual Time: 9150-5697 SLP Individual Time Calculation (min): 55 min  Short Term Goals: Week 1: SLP Short Term Goal 1 (Week 1): Pt will demonstrate orientation x4 with min verbal cues. SLP Short Term Goal 2 (Week 1): Pt will complete basic problem solving/thought organization/ reasoning tasks with min assist given verbal cues SLP Short Term Goal 3 (Week 1): Pt will tolerate dysphagia 2 diet and thin liquids without overt s/s aspiration or decline in respiratory status. SLP Short Term Goal 4 (Week 1): Pt will verbalize safe swallow strategies for minimizing aspiration risk with min cues  Skilled Therapeutic Interventions:  Session 1: Skilled treatment session focused on dysphagia goals. SLP facilitated session by providing Mod A verbal cues for patient to clear oral residue prior to taking another bite with breakfast meal of Dys. 2 textures with thin liquids. Patient also demonstrated mildly prolonged mastication with solid textures but consumed meal with minimal overt s/s of aspiration. Recommend patient continue current diet with full supervision to maximize safety. Patient left upright in bed with alarm on and all needs within reach. Continue with current plan of care.  Session 2: Skilled treatment session focused on cognitive goals. SLP facilitated session by providing extra time and overall Mod A verbal and visual cues for functional problem solving during a basic money management task. Patient was upright in his wheelchair throughout the session on room air with all vitals remaining WFL.  Patient left upright in wheelchair with alarm on and all needs within reach. Continue with current plan of care.   Pain Pain Assessment Pain  Score: 0-No pain  Therapy/Group: Individual Therapy  Ai Sonnenfeld 07/23/2018, 12:21 PM

## 2018-07-23 NOTE — Progress Notes (Signed)
Occupational Therapy Session Note  Patient Details  Name: Evan [name] MRN: [id number] Date of Birth: 29-Mar-1928  Today's Date: 07/23/2018 OT Individual Time: 1330-1410 OT Individual Time Calculation (min): 40 min   Short Term Goals: Week 1:  OT Short Term Goal 1 (Week 1): Pt will complete toilet transfer with max A  OT Short Term Goal 2 (Week 1): Pt will complete sit<>stand with max A in preparation for BADL tasks OT Short Term Goal 3 (Week 1): Pt will tolerate sitting EOB for 4 mins to increase endurance for BADL tasks  Skilled Therapeutic Interventions/Progress Updates:    Pt greeted semi-reclined in bed and agreeable to OT treatment session. Pt much less SOB throughout session with improved activity tolerance overall. Pt came to sitting EOB with mod A. Pt able to maintain sitting balance with verbal cues to lean to the L and min A. Sit<>stand from stedy with OT placing R UE on handlebar and only min A to achieve stand. Had pt hold on to [facility] with R hand on L side of handle to help maintain midline. Pt transferred to wc. UB bathing completed at the sink with focus on functional use of R UE. Guided A to help smoothness and accuracy of R hand movements. LB bathing completed sit<>stand in [place]. Pt incontinent of BM once standing. Total A for peri-care and brief change, but pt tolerated standing during task in [place]. Nursing changed out pt's bed, then pt transferred back to bed via Stedy. OT positioned bed in chair position and pt left with bed alarm on and needs met.   Therapy Documentation Precautions:  Precautions Precautions: Fall Precaution Comments: bruising along R side of neck Restrictions Weight Bearing Restrictions: No Pain:  none/denies pain  Therapy/Group: Individual Therapy  Valma Cava 07/23/2018, 2:20 PM

## 2018-07-24 ENCOUNTER — Inpatient Hospital Stay (HOSPITAL_COMMUNITY): Payer: Medicare HMO | Admitting: Occupational Therapy

## 2018-07-24 ENCOUNTER — Inpatient Hospital Stay (HOSPITAL_COMMUNITY): Payer: Medicare HMO | Admitting: Speech Pathology

## 2018-07-24 ENCOUNTER — Inpatient Hospital Stay (HOSPITAL_COMMUNITY): Payer: Medicare HMO | Admitting: Physical Therapy

## 2018-07-24 MED ORDER — PREDNISONE 10 MG PO TABS: 10.0000 mg | ORAL_TABLET | Freq: Every day | ORAL | Status: DC

## 2018-07-24 MED ORDER — LORAZEPAM 0.5 MG PO TABS
0.7500 mg | ORAL_TABLET | Freq: Three times a day (TID) | ORAL | Status: DC
  Administered 2018-07-24 – 2018-08-15 (×65): 0.75 mg via ORAL
  Filled 2018-07-24 (×51): qty 2
  Filled 2018-07-24: qty 1.5
  Filled 2018-07-24 (×16): qty 2

## 2018-07-24 MED ORDER — PREDNISONE 10 MG PO TABS: 5.0000 mg | ORAL_TABLET | Freq: Every day | ORAL | Status: DC

## 2018-07-24 MED ORDER — PREDNISONE 10 MG PO TABS
5.0000 mg | ORAL_TABLET | Freq: Every day | ORAL | Status: AC
  Administered 2018-07-25: 5 mg via ORAL
  Filled 2018-07-24: qty 1

## 2018-07-24 NOTE — Progress Notes (Signed)
Occupational Therapy Weekly Progress Note  Patient Details  Name: Evan [name] MRN: [id number] Date of Birth: Aug 20, 1927  Beginning of progress report period: July 17, 2018 End of progress report period: July 24, 2018  Today's Date: 07/24/2018 OT Individual Time: 7353-2992 OT Individual Time Calculation (min): 70 min    Patient has met 3 of 3 short term goals.  Pt has been progressing slowly towards OT goals 2/2 medical issues involving SOB. However, the past few days, pt has been making more steady progress with OT. Pt has improved activity tolerance within ADLs and can transfer using the [facility] with mod A. Pt continues to push to the R, but has been able to correct pushing and sitting balance more frequently. Continue current POC>   Patient continues to demonstrate the following deficits: muscle weakness, decreased cardiorespiratoy endurance, impaired timing and sequencing, abnormal tone, unbalanced muscle activation, motor apraxia, ataxia, decreased coordination and decreased motor planning, decreased midline orientation, decreased attention to right and decreased motor planning, decreased initiation, decreased attention, decreased awareness, decreased problem solving, decreased safety awareness, decreased memory and delayed processing and decreased sitting balance, decreased standing balance, decreased postural control, hemiplegia and decreased balance strategies and therefore will continue to benefit from skilled OT intervention to enhance overall performance with BADL and Reduce care partner burden.  Patient progressing toward long term goals..  Continue plan of care.  OT Short Term Goals Week 1:  OT Short Term Goal 1 (Week 1): Pt will complete toilet transfer with max A  OT Short Term Goal 1 - Progress (Week 1): Met OT Short Term Goal 2 (Week 1): Pt will complete sit<>stand with max A in preparation for BADL tasks OT Short Term Goal 2 - Progress (Week 1): Met OT Short Term Goal 3 (Week  1): Pt will tolerate sitting EOB for 4 mins to increase endurance for BADL tasks OT Short Term Goal 3 - Progress (Week 1): Met Week 2:  OT Short Term Goal 1 (Week 2): Pt will complete toilet transfer with max A without Stedy OT Short Term Goal 2 (Week 2): Pt will completed 1 step of LB dressing task OT Short Term Goal 3 (Week 2): Pt will complete sit<>stand at the sink without Stedy with max A in preparation for BADL tasks  Skilled Therapeutic Interventions/Progress Updates:    Pt greeted semi-reclined in bed with nursing finishing changing dressing on R UE. Pt agreeable to therapy with encouragement. Pt able to advance B LEs to EOB, then needed assistance to elevate trunk. Sit<>stand from [place] with hand over hand A to bring R UE to handlebar, then pt able to stand with min A. Pt brought to the sink in [place] and worked on standing balance and midline orientation Environmental health practitioner. Pt able to correct lateral lean and maintain midline with mirror and verbal cues. UB bathing completed from wc with focus on forced use of R UE. Pt needed OT assist to maintain grasp on wash cloth and guided A to initiate movements. Sit<>stand in [place] while OT assisted with changing brief. Pt incontinent of bowel and bladder requiring total A to cleanse. Pt also with increased lateral lean and push to the R with fatigue. Encouraged pt to shave today, but pt stated he did not feel like it. Worked on UB and LB dressing, however, pt too fatigued to attempt LB dressing. Educated pt on hemi-dressing techniques for UB dressing, but pt still required max A. Pt reported max fatigue and stedy used to transfer  pt back to bed with min A to power up into standing. Bed placed in chair position with UEs supported. Bed alarm on and needs met.   Therapy Documentation Precautions:  Precautions Precautions: Fall Precaution Comments: bruising along R side of neck Restrictions Weight Bearing Restrictions: No Pain: Pain Assessment Pain  Scale: 0-10 Pain Score: 0-No pain   Therapy/Group: Individual Therapy  Valma Cava 07/24/2018, 10:26 AM

## 2018-07-24 NOTE — Progress Notes (Signed)
Speech Language Pathology Weekly Progress and Session Note  Patient Details  Name: WILVER TIGNOR MRN: [id number] Date of Birth: 10-Dec-1927  Beginning of progress report period: July 16, 2018 End of progress report period: July 24, 2018  Today's Date: 07/24/2018 SLP Individual Time: 1300-1340 SLP Individual Time Calculation (min): 40 min  Short Term Goals: Week 1: SLP Short Term Goal 1 (Week 1): Pt will demonstrate orientation x4 with min verbal cues. SLP Short Term Goal 1 - Progress (Week 1): Not met SLP Short Term Goal 2 (Week 1): Pt will complete basic problem solving/thought organization/ reasoning tasks with min assist given verbal cues SLP Short Term Goal 2 - Progress (Week 1): Not met SLP Short Term Goal 3 (Week 1): Pt will tolerate dysphagia 2 diet and thin liquids without overt s/s aspiration or decline in respiratory status. SLP Short Term Goal 3 - Progress (Week 1): Met SLP Short Term Goal 4 (Week 1): Pt will verbalize safe swallow strategies for minimizing aspiration risk with min cues SLP Short Term Goal 4 - Progress (Week 1): Not met    New Short Term Goals: Week 2: SLP Short Term Goal 1 (Week 2): Pt will demonstrate orientation x4 with min verbal cues. SLP Short Term Goal 2 (Week 2): Pt will complete basic problem solving tasks with min assist given verbal cues. SLP Short Term Goal 3 (Week 2): Patient will consume current diet with minimal overt s/s of aspiration and overall Min A verbal cues for use of swallowing compensatory strategies.   Weekly Progress Updates: Patient has made slow and inconsistent gains and has met 1 of 4 STGs this reporting period. Currently, patient is consuming Dys.2 textures with thin liquids with minimal overt s/s of aspiration and requires overall Mod A verbal cues for use of swallowing compensatory strategies. Patient also requires Mod-Max A verbal cues for use of external aids for orientation and functional problem solving with familiar tasks.  Patient's overall progress and function has been impacted by fatigue and respiratory status. Patient and family education is ongoing. Patient would benefit from continued skilled SLP intervention to maximize his swallowing and cognitive functioning prior to discharge.      Intensity: Minumum of 1-2 x/day, 30 to 90 minutes Frequency: 3 to 5 out of 7 days Duration/Length of Stay: 2-3 weeks Treatment/Interventions: Cognitive remediation/compensation;Multimodal communication approach;Functional tasks;Therapeutic Activities;Patient/family education;Environmental controls;Internal/external aids;Cueing hierarchy;Dysphagia/aspiration precaution training   Daily Session  Skilled Therapeutic Interventions: Skilled treatment session focused on cognitive goals. SLP facilitated session by providing Mod A verbal cues for recall of events from previous therapy sessions and overall supervision verbal cues for orientation to place and date with use of external aids. Patient also required Max A verbal cues for problem solving while navigating a menu to make appropriate meal choices for dinner and breakfast. Patient left upright in bed with alarm on and all needs within reach. Continue with current plan of care.      Pain No/Denies Pain   Therapy/Group: Individual Therapy  Kalkidan Caudell 07/24/2018, 6:48 AM

## 2018-07-24 NOTE — Progress Notes (Addendum)
Patient remained extremely anxious after taking his anxiety  medications . Had to use multiple calming techniques and distractions to calm him. Patient tried multiple times to get up and was very confused. Patient stated "I'm ready to go to the ball game." Tried to orient him and was unsuccessful. Tele-sitter ordered for patient safety. Patient remained tachypneic during sleep. Breath sounds were clear/ diminished and Spo2 remained in the 90's. Did not require oxygen over night. Patient had 2 BM and is incontinent of bowel and sometimes bladder.Daughter called to check on him and requested that he be shaved. Patient removed his brief. Will continue to monitor.

## 2018-07-24 NOTE — Plan of Care (Signed)
  Problem: RH SKIN INTEGRITY Goal: RH STG SKIN FREE OF INFECTION/BREAKDOWN Description Monitor skin for breakdown and skin tears during admission. No breakdown with min assist  Outcome: Progressing Goal: RH STG MAINTAIN SKIN INTEGRITY WITH ASSISTANCE Description STG Maintain Skin Integrity With [place].  Outcome: Progressing Goal: RH STG ABLE TO PERFORM INCISION/WOUND CARE W/ASSISTANCE Description STG Able To Perform Incision/Wound Care With [employer].  Outcome: Progressing   Problem: RH PAIN MANAGEMENT Goal: RH STG PAIN MANAGED AT OR BELOW PT'S PAIN GOAL Description Patient will be pain free or pain less than 3 during admission  Outcome: Progressing   Problem: Consults Goal: RH STROKE PATIENT EDUCATION Description See Patient Education module for education specifics  Outcome: Not Progressing Goal: Nutrition Consult-if indicated Outcome: Not Progressing Goal: Diabetes Guidelines if Diabetic/Glucose > 140 Description If diabetic or lab glucose is > 140 mg/dl - Initiate Diabetes/Hyperglycemia Guidelines & Document Interventions  Outcome: Not Progressing   Problem: RH BOWEL ELIMINATION Goal: RH STG MANAGE BOWEL WITH ASSISTANCE Description STG Manage Bowel with Min Assistance.  Outcome: Not Progressing Goal: RH STG MANAGE BOWEL W/MEDICATION W/ASSISTANCE Description STG Manage Bowel with Medication with Assistance. mod  Outcome: Not Progressing   Problem: RH BLADDER ELIMINATION Goal: RH STG MANAGE BLADDER WITH ASSISTANCE Description STG Manage Bladder With Min  Assistance  Outcome: Not Progressing Goal: RH STG MANAGE BLADDER WITH EQUIPMENT WITH ASSISTANCE Description STG Manage Bladder With Equipment With  Min Assistance  Outcome: Not Progressing   Problem: RH SAFETY Goal: RH STG ADHERE TO SAFETY PRECAUTIONS W/ASSISTANCE/DEVICE Description STG Adhere to Safety Precautions With Min Assistance/Device.  Outcome: Not Progressing   Problem: RH  COGNITION-NURSING Goal: RH STG USES MEMORY AIDS/STRATEGIES W/ASSIST TO PROBLEM SOLVE Description STG Uses Memory Aids/Strategies With Min Assistance to Problem Solve.  Outcome: Not Progressing Goal: RH STG ANTICIPATES NEEDS/CALLS FOR ASSIST W/ASSIST/CUES Description STG Anticipates Needs/Calls for Assist With  Min Assistance/Cues.  Outcome: Not Progressing

## 2018-07-24 NOTE — Progress Notes (Signed)
Physical Therapy Weekly Progress Note  Patient Details  Name: Evan [name] MRN: [id number] Date of Birth: 1927/07/21  Beginning of progress report period: July 17, 2018 End of progress report period: July 24, 2018  Today's Date: 07/24/2018 PT Individual Time: 8182-9937 PT Individual Time Calculation (min): 40 min   Patient has met 2 of 4 short term goals. Pt has made limited progress towards LTGs this week 2/2 intermittent fatigue/SOB limiting participation. He continues to require the stedy for safe transfers 2/2 impaired midline orientation and decreased awareness/coordination of R side of body. However he has had a few instances of mod-max assist stand pivot when not fatigued. Gait remains non-functional at this time.   Patient continues to demonstrate the following deficits muscle weakness, decreased cardiorespiratoy endurance, impaired timing and sequencing, unbalanced muscle activation, decreased coordination and decreased motor planning, decreased midline orientation and decreased attention to right, decreased attention, decreased awareness, decreased problem solving, decreased safety awareness, decreased memory and delayed processing and decreased sitting balance, decreased standing balance, decreased postural control, hemiplegia and decreased balance strategies and therefore will continue to benefit from skilled PT intervention to increase functional independence with mobility.  Patient progressing toward long term goals..  Continue plan of care, LTGs remain appropriate for his LOS and CLOF. Stair LTG discontinued, anticipate pt will not be negotiating stairs safely w/ family assist upon d/c.   PT Short Term Goals Week 1:  PT Short Term Goal 1 (Week 1): Pt will initiate gait training PT Short Term Goal 1 - Progress (Week 1): Met PT Short Term Goal 2 (Week 1): Pt will initiate w/c mobility training PT Short Term Goal 2 - Progress (Week 1): Not met PT Short Term Goal 3 (Week 1): Pt  will transfer bed<>chair w/ mod assist PT Short Term Goal 3 - Progress (Week 1): Met PT Short Term Goal 4 (Week 1): Pt will attend to R side during functional mobility w/o cues 50% of the time PT Short Term Goal 4 - Progress (Week 1): Progressing toward goal Week 2:  PT Short Term Goal 1 (Week 2): Pt will initiate w/c mobility  PT Short Term Goal 2 (Week 2): Pt will maintain dynamic sitting balance w/ min assist PT Short Term Goal 3 (Week 2): Pt will attend to RLE during functional mobility w/ min cues 50% of the time  Skilled Therapeutic Interventions/Progress Updates:   Pt in supine and agreeable to therapy, no c/o pain. Min assist bed mobility and total assist needed to use urinal at EOB, however pt unable to void despite increased time. Stedy transfer to w/c and total assist transport to/from therapy gym. Worked on gait training at rail in hallway and [employer]. Ambulated 10' w/ max assist for upright balance and RLE management. Needed manual facilitation for lateral weight shifting and manual assist for RLE placement 2/2 scissoring. Pt w/ heavy R lateral lean, w/c follow for safety. Worked on R forward and backward stepping while standing at rail, mod assist for upright balance and tactile cues to prevent scissoring. Max verbal and visual cues for placement and attention to task. Returned to room and performed mod assist stand pivot transfer back to EOB. Ended session in supine, all needs in reach.   Therapy Documentation Precautions:  Precautions Precautions: Fall Precaution Comments: bruising along R side of neck Restrictions Weight Bearing Restrictions: No Vital Signs: Therapy Vitals Pulse Rate: 77 BP: (!) 104/59 Patient Position (if appropriate): Lying Oxygen Therapy SpO2: 97 % O2 Device: Room Air  Therapy/Group:  Individual Therapy  Devynn Hessler Clent Demark 07/24/2018, 12:50 PM

## 2018-07-24 NOTE — Progress Notes (Signed)
[place] PHYSICAL MEDICINE & REHABILITATION PROGRESS NOTE  Subjective/Complaints: Patient seen laying in bed this morning.  He was easily arousable, but confused upon waking up.  Later informed by nursing that patient called his daughter stating that he is dying, daughter would like to speak to me. Discussed with daughter, answered questions.    ROS: Limited due to [facility]  Objective: Vital Signs: Blood pressure (!) 102/57, pulse 79, temperature 97.8 F (36.6 C), temperature source Oral, resp. rate 20, height 5\' 5"  (1.651 m), weight 79.8 kg, SpO2 97 %. 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 EXAM: CT HEAD WITHOUT CONTRAST TECHNIQUE: Contiguous axial images were obtained from the base of the skull through the vertex without intravenous contrast. COMPARISON:  07/15/2018 and earlier. FINDINGS: Brain: Oval hyperdense left thalamic hemorrhage is slowly fading and encompasses 16 x 23 x 19 mm (stable). Regional edema and mild mass effect have not significantly changed. No intraventricular extension. No other intracranial blood products identified. Elsewhere Stable gray-white matter differentiation throughout the brain. No evidence of acute cortically based infarct. Vascular: Calcified atherosclerosis at the skull base. No suspicious intracranial vascular hyperdensity. Skull: No acute osseous abnormality identified. Sinuses/Orbits: Chronic left maxillary sinusitis and previous right mastoidectomy. Other: No acute orbit or scalp soft tissue finding. Calcified scalp atherosclerosis. IMPRESSION: 1. Evolving left thalamic hemorrhage with stable size and configuration since 07/15/2018. 2. No new intracranial abnormality. Electronically Signed   By: Genevie Ann M.D.   On: 07/23/2018 08:38   Recent Labs    07/21/18 1454 07/23/18 0957  WBC 9.6 14.9*  HGB 10.8* 10.9*  HCT 33.8* 33.6*  PLT 131* 179   Recent Labs    07/22/18 0637  07/23/18 0957  NA 132* 133*  K 4.8 4.5  CL 101 102  CO2 21* 18*  GLUCOSE 175* 123*  BUN 31* 41*  CREATININE 1.28* 1.38*  CALCIUM 8.9 9.2    Physical Exam: BP (!) 102/57 (BP Location: Left Arm)   Pulse 79   Temp 97.8 F (36.6 C) (Oral)   Resp 20   Ht 5\' 5"  (1.651 m)   Wt 79.8 kg   SpO2 97%   BMI 29.29 kg/m  Constitutional: No distress . Vital signs reviewed. HENT: Normocephalic.  Atraumatic. Eyes: EOMI.  No discharge. Cardiovascular: No JVD. Respiratory: Normal effort.   GI: Non-distended. Musc: No edema or tenderness in extremities. Neurological:  He isalert and oriented x1, however just waking up. Extremely HOH.  Motor:  RUE: Shoulder abduction 4-/5, elbow flexion/extension 4-/5, handgrip 4-/5, unchanged Right lower extremity: Hip flexion, knee extension 3+/5, ankle dorsiflexion 3/5, unchanged Skin: Right neck with ecchymosis and hard indurated area under chin, improving.  Psychiatric: Appears to have normal behavior and affect.   Assessment/Plan: 1. 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  Physiatrist is providing close team supervision and 24 hour management of active medical problems listed below.  Physiatrist and rehab team continue to assess barriers to discharge/monitor patient progress toward functional and medical goals  Care Tool:  Bathing    Body parts bathed by patient: Left arm, Chest, Abdomen, Front perineal area   Body parts bathed by helper: Right arm, Left arm, Chest, Abdomen, Front perineal area, Right lower leg, Left upper leg, Right upper leg, Buttocks, Left lower leg, Face     Bathing assist Assist Level: Maximal Assistance - Patient 24 -  49%     Upper Body Dressing/Undressing Upper body dressing Upper body dressing/undressing activity did not occur (including orthotics): N/A What is the patient wearing?: Pull over  shirt    Upper body assist Assist Level: Moderate Assistance - Patient 50 - 74%    Lower Body Dressing/Undressing Lower body dressing    Lower body dressing activity did not occur: N/A What is the patient wearing?: Incontinence brief, Pants     Lower body assist Assist for lower body dressing: Total Assistance - Patient < 25%     Toileting Toileting Toileting Activity did not occur Landscape architect and hygiene only): N/A (no void or bm)  Toileting assist Assist for toileting: Maximal Assistance - Patient 25 - 49%     Transfers Chair/bed transfer  Transfers assist  Chair/bed transfer activity did not occur: Safety/medical concerns  Chair/bed transfer assist level: Maximal Assistance - Patient 25 - 49%     Locomotion Ambulation   Ambulation assist   Ambulation activity did not occur: Safety/medical concerns          Walk 10 feet activity   Assist  Walk 10 feet activity did not occur: Safety/medical concerns        Walk 50 feet activity   Assist Walk 50 feet with 2 turns activity did not occur: Safety/medical concerns         Walk 150 feet activity   Assist Walk 150 feet activity did not occur: Safety/medical concerns         Walk 10 feet on uneven surface  activity   Assist Walk 10 feet on uneven surfaces activity did not occur: Safety/medical concerns         Wheelchair     Assist Will patient use wheelchair at discharge?: Yes Type of Wheelchair: Manual    Wheelchair assist level: Dependent - Patient 0%      Wheelchair 50 feet with 2 turns activity    Assist        Assist Level: Dependent - Patient 0%   Wheelchair 150 feet activity     Assist     Assist Level: Dependent - Patient 0%      Medical Problem List and Plan: 1.Functional and cognitive 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  Cont CIR  CT head performed on 4/20 showing slight improvement.  Please see PA note-discussed with  neurology, plan for repeat CT in 1 week to determine plans for anticoagulation. 2. Antithrombotics: -PE/LLEDVT/anticoagulation:None due to [place] -antiplatelet therapy: N/A 3. Pain Management:tylenol prn. 4. Mood:LCSW to follow for evaluation and support. antipsychotic agents: N/A.   Ativan tid PTA, increased to 0.75 TID on 4/21  Prozac. 5. Neuropsych: This patientis not fullycapable of making decisions onhisown behalf. 6. Skin/Wound Care:Monitor right neck hematoma for resolution. 7. Fluids/Electrolytes/Nutrition:Monitor I/O.  8.Thrombocytopenia/ ABLA: Continue to monitor H/H/Platelets with serial checks. Monitor for signs of bleeding.   Hemoglobin 10.9 on 4/20  Platelets 179 on 4/20  Continue to monitor 9. Right forearm wound/ recent cellulitis:Completed course of Keflex on 4/16. Added Aqacell to wound as per derm records. 10.CKD stage III: Avoid nephrotoxic medications. Monitor with serial checks.   Creatinine 1.38 on 4/20  Encourage fluids  Continue to monitor 11. H/o A fib: Monitor HR bid. Continue metoprolol bid--off Lovenox.  12. H/o Depression: On Prozac daily with ativan tid.  19. H/o Lung cancer: Had PE on Eliquis. No antiplatelets or anticoagulation for now.   4/18 CXR reviewed, suggesting improvement  CTA on 4/18- PE resolved, progression of right  hilum abnormality, chronic radiation scarring  Continue steroids for now  Added Mucinex dm to help with cough/clearance  -encourage IS/FV  -pulmonary toilet 14. Labile blood pressure  Extremely labile overnight on 4/21 15.  Steroid-induced hyperglycemia  Monitor with steroids 16. Leukocytosis: Likely steroid induced  WBCs 14.9 on 4/20  Afebrile  UA negative, urine culture not collected 17.  Hyponatremia  Sodium 133 on 4/20  LOS: 8 days A FACE TO FACE EVALUATION WAS PERFORMED  Keenan Trefry Lorie Phenix 07/24/2018, 8:13 AM

## 2018-07-25 ENCOUNTER — Inpatient Hospital Stay (HOSPITAL_COMMUNITY): Payer: Medicare HMO | Admitting: Speech Pathology

## 2018-07-25 ENCOUNTER — Inpatient Hospital Stay (HOSPITAL_COMMUNITY): Payer: Medicare HMO | Admitting: Physical Therapy

## 2018-07-25 ENCOUNTER — Inpatient Hospital Stay (HOSPITAL_COMMUNITY): Payer: Medicare HMO

## 2018-07-25 ENCOUNTER — Inpatient Hospital Stay (HOSPITAL_COMMUNITY): Payer: Medicare HMO | Admitting: Occupational Therapy

## 2018-07-25 DIAGNOSIS — F411 Generalized anxiety disorder: Secondary | ICD-10-CM

## 2018-07-25 NOTE — Progress Notes (Signed)
Speech Language Pathology Daily Session Note  Patient Details  Name: Evan [name] MRN: [id number] Date of Birth: [birth year]  Today's Date: 07/25/2018 SLP Individual Time: 0710-0735 SLP Individual Time Calculation (min): 25 min  Short Term Goals: Week 2: SLP Short Term Goal 1 (Week 2): Pt will demonstrate orientation x4 with min verbal cues. SLP Short Term Goal 2 (Week 2): Pt will complete basic problem solving tasks with min assist given verbal cues. SLP Short Term Goal 3 (Week 2): Patient will consume current diet with minimal overt s/s of aspiration and overall Min A verbal cues for use of swallowing compensatory strategies.   Skilled Therapeutic Interventions: Skilled treatment session focused on cognitive goals. Upon arrival, patient was yelling "help" out into the hallway. When asked what he needed, he reported he was cold. Patient also independently requested to use the urinal but had already been incontinent of urine. Patient followed commands for bed mobility while changing his brief with extra time and Min A verbal cues. Comprehension impacted today due to missing hearing aid in right ear. Staff aware and will continue to attempt to locate. Patient with increased verbal expression and asking appropriate questions in regards to time of day, etc. Patient was independently oriented to place. Patient left upright in bed with all needs within reach and alarm on. Continue with current plan of care.      Pain No/Denies Pain   Therapy/Group: Individual Therapy  Colen Eltzroth 07/25/2018, 7:48 AM

## 2018-07-25 NOTE — Progress Notes (Signed)
[place] PHYSICAL MEDICINE & REHABILITATION PROGRESS NOTE  Subjective/Complaints: Patient seen laying in bed this morning.  He slept well overnight per sleep chart.  He is much calmer this morning, despite not having his hearing aid.  ROS: Limited due to [facility], but appears to deny CP, SOB, N/V/D.  Objective: Vital Signs: Blood pressure (!) 141/89, pulse 72, temperature (!) 97.5 F (36.4 C), temperature source Oral, resp. rate (!) 21, height 5\' 5"  (1.651 m), weight 79.8 kg, SpO2 100 %. 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 EXAM: CT HEAD WITHOUT CONTRAST TECHNIQUE: Contiguous axial images were obtained from the base of the skull through the vertex without intravenous contrast. COMPARISON:  07/15/2018 and earlier. FINDINGS: Brain: Oval hyperdense left thalamic hemorrhage is slowly fading and encompasses 16 x 23 x 19 mm (stable). Regional edema and mild mass effect have not significantly changed. No intraventricular extension. No other intracranial blood products identified. Elsewhere Stable gray-white matter differentiation throughout the brain. No evidence of acute cortically based infarct. Vascular: Calcified atherosclerosis at the skull base. No suspicious intracranial vascular hyperdensity. Skull: No acute osseous abnormality identified. Sinuses/Orbits: Chronic left maxillary sinusitis and previous right mastoidectomy. Other: No acute orbit or scalp soft tissue finding. Calcified scalp atherosclerosis. IMPRESSION: 1. Evolving left thalamic hemorrhage with stable size and configuration since 07/15/2018. 2. No new intracranial abnormality. Electronically Signed   By: Genevie Ann M.D.   On: 07/23/2018 08:38   Recent Labs    07/23/18 0957  WBC 14.9*  HGB 10.9*  HCT 33.6*  PLT 179   Recent Labs    07/23/18 0957  NA 133*  K 4.5  CL 102  CO2 18*  GLUCOSE 123*  BUN 41*  CREATININE 1.38*  CALCIUM 9.2    Physical  Exam: BP (!) 141/89 (BP Location: Left Arm)   Pulse 72   Temp (!) 97.5 F (36.4 C) (Oral)   Resp (!) 21   Ht 5\' 5"  (1.651 m)   Wt 79.8 kg   SpO2 100%   BMI 29.29 kg/m  Constitutional: No distress . Vital signs reviewed. HENT: Normocephalic. Atraumatic. Eyes: EOMI. No discharge. Cardiovascular: No JVD. Respiratory: Normal effort.  +[place]. GI: Non-distended. Musc: No edema or tenderness in extremities. Neurological:  He isalert. Extremely HOH.  Motor:  RUE: Shoulder abduction 4-/5, elbow flexion/extension 4-/5, handgrip 4-/5, stable  Right lower extremity: Hip flexion, knee extension 3+/5, ankle dorsiflexion 3/5, stable Skin: Right neck with ecchymosis and hard indurated area under chin, improving.  Psychiatric: Appears to have normal behavior and affect.   Assessment/Plan: 1. 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  Physiatrist is providing close team supervision and 24 hour management of active medical problems listed below.  Physiatrist and rehab team continue to assess barriers to discharge/monitor patient progress toward functional and medical goals  Care Tool:  Bathing    Body parts bathed by patient: Left arm, Chest, Abdomen, Front perineal area   Body parts bathed by helper: Right arm, Left arm, Chest, Abdomen, Front perineal area, Right lower leg, Left upper leg, Right upper leg, Buttocks, Left lower leg, Face     Bathing assist Assist Level: Maximal Assistance - Patient 24 - 49%     Upper Body Dressing/Undressing Upper body dressing Upper body dressing/undressing activity did not occur (including orthotics): N/A What is the patient wearing?: Pull over shirt  Upper body assist Assist Level: Moderate Assistance - Patient 50 - 74%    Lower Body Dressing/Undressing Lower body dressing    Lower body dressing activity did not occur:  N/A What is the patient wearing?: Incontinence brief, Pants     Lower body assist Assist for lower body dressing: Total Assistance - Patient < 25%     Toileting Toileting Toileting Activity did not occur Landscape architect and hygiene only): N/A (no void or bm)  Toileting assist Assist for toileting: Maximal Assistance - Patient 25 - 49%     Transfers Chair/bed transfer  Transfers assist  Chair/bed transfer activity did not occur: Safety/medical concerns  Chair/bed transfer assist level: Moderate Assistance - Patient 50 - 74%     Locomotion Ambulation   Ambulation assist   Ambulation activity did not occur: Safety/medical concerns  Assist level: Maximal Assistance - Patient 25 - 49% Assistive device: Other (comment)(rail in hallway) Max distance: 10'   Walk 10 feet activity   Assist  Walk 10 feet activity did not occur: Safety/medical concerns  Assist level: Maximal Assistance - Patient 25 - 49% Assistive device: Other (comment)(rail in hallway)   Walk 50 feet activity   Assist Walk 50 feet with 2 turns activity did not occur: Safety/medical concerns         Walk 150 feet activity   Assist Walk 150 feet activity did not occur: Safety/medical concerns         Walk 10 feet on uneven surface  activity   Assist Walk 10 feet on uneven surfaces activity did not occur: Safety/medical concerns         Wheelchair     Assist Will patient use wheelchair at discharge?: Yes Type of Wheelchair: Manual    Wheelchair assist level: Dependent - Patient 0%      Wheelchair 50 feet with 2 turns activity    Assist        Assist Level: Dependent - Patient 0%   Wheelchair 150 feet activity     Assist     Assist Level: Dependent - Patient 0%      Medical Problem List and Plan: 1.Functional and cognitive 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  Cont CIR  CT head performed on 4/20 showing slight improvement.  Please see  PA note-discussed with neurology, plan for repeat CT in 1 week (4/27) to determine plans for anticoagulation.  Team conference today to discuss current and goals and coordination of care, home and environmental barriers, and discharge planning with nursing, case manager, and therapies.  2. Antithrombotics: -PE/LLEDVT/anticoagulation:None due to [place] -antiplatelet therapy: N/A 3. Pain Management:tylenol prn. 4. Mood:LCSW to follow for evaluation and support. antipsychotic agents: N/A.   Ativan tid PTA, increased to 0.75 TID on 4/21  Prozac. 5. Neuropsych: This patientis not fullycapable of making decisions onhisown behalf. 6. Skin/Wound Care:Monitor right neck hematoma for resolution. 7. Fluids/Electrolytes/Nutrition:Monitor I/O.  8.Thrombocytopenia/ ABLA: Continue to monitor H/H/Platelets with serial checks. Monitor for signs of bleeding.   Hemoglobin 10.9 on 4/20  Platelets 179 on 4/20  Plan to order labs for the end of the week  Continue to monitor 9. Right forearm wound/ recent cellulitis:Completed course of Keflex on 4/16. Added Aqacell to wound as per derm records. 10.CKD stage III: Avoid nephrotoxic medications. Monitor with serial checks.   Creatinine 1.38 on 4/20  Plan to order labs for the end of the week  Encourage fluids  Continue to monitor 11. H/o A fib: Monitor HR bid. Continue metoprolol bid--off Lovenox.  12. H/o Depression: On Prozac daily with ativan tid.  66. H/o Lung cancer: Had PE on Eliquis. No antiplatelets or anticoagulation for now.   4/18 CXR reviewed, suggesting improvement  CTA on 4/18- PE resolved, progression of right hilum abnormality, chronic radiation scarring  Continue steroids for now  Added Mucinex dm to help with cough/clearance  -encourage IS/FV  -pulmonary toilet 14. Labile blood pressure  Labile on 4/22 15.  Steroid-induced hyperglycemia  Monitor with steroids 16. Leukocytosis: Likely steroid  induced  WBCs 14.9 on 4/20  Afebrile  UA negative, urine culture not collected 17.  Hyponatremia  Sodium 133 on 4/20  Plan to order labs for the end of the week  LOS: 9 days A FACE TO FACE EVALUATION WAS PERFORMED  Evan [name] Lorie Phenix 07/25/2018, 8:14 AM

## 2018-07-25 NOTE — Progress Notes (Signed)
Occupational Therapy Session Note  Patient Details  Name: JILLIAN PIANKA MRN: [id number] Date of Birth: 1927/09/02  Today's Date: 07/25/2018 OT Individual Time: 0940-1020 OT Individual Time Calculation (min): 40 min    Short Term Goals: Week 2:  OT Short Term Goal 1 (Week 2): Pt will complete toilet transfer with max A without Stedy OT Short Term Goal 2 (Week 2): Pt will completed 1 step of LB dressing task OT Short Term Goal 3 (Week 2): Pt will complete sit<>stand at the sink without Stedy with max A in preparation for BADL tasks  Skilled Therapeutic Interventions/Progress Updates:    Session focused on toileting tasks and ADL transfers. Pt received supine, lethargic and requiring moderate cueing to arouse. Pt completed transfer to EOB with mod lifting assistance. Pt reported he needed to pee and stedy was obtained. Stedy was positioned and pt impulsive with transfer, requiring cueing for safety awareness. Pt completed sit <> stand in stedy with min A. Mod A required to maintain static standing balance in stedy, with heavy R lean. Pt was transferred onto [facility] where he voided urine. Pt able to complete anterior peri hygiene seated, total A required for posterior in standing. Pt transferred back to EOB where he completed oral care. Pt requried moderate sitting balance assist while brushing teeth with mod-max cueing for correction. Pt was left supine with all needs met, bed alarm set.   Therapy Documentation Precautions:  Precautions Precautions: Fall Precaution Comments: bruising along R side of neck Restrictions Weight Bearing Restrictions: No   Therapy/Group: Individual Therapy  Curtis Sites 07/25/2018, 11:42 AM

## 2018-07-25 NOTE — Progress Notes (Signed)
Physical Therapy Session Note  Patient Details  Name: Evan [name] MRN: [id number] Date of Birth: [birth year]  Today's Date: 07/25/2018 PT Individual Time: 1505-1530 PT Individual Time Calculation (min): 25 min   Short Term Goals: Week 2:  PT Short Term Goal 1 (Week 2): Pt will initiate w/c mobility  PT Short Term Goal 2 (Week 2): Pt will maintain dynamic sitting balance w/ min assist PT Short Term Goal 3 (Week 2): Pt will attend to RLE during functional mobility w/ min cues 50% of the time  Skilled Therapeutic Interventions/Progress Updates:   Pt in supine and agreeable to therapy, no c/o pain but reports having a bad day today, unable to further explain. No SOB today however pt on 2 L/min O2 throughout session. Min assist bed mobility and worked on static sitting balance at EOB w/ close supervision and sit<>stands to RW w/ min assist to boost into standing, mod assist to stabilize in stance. Emphasis on equal weight distribution and midline orientation. Able to reach midline w/ verbal and tactile cues 50% of the time, otherwise R lateral lean w/ mod assist to correct. Performed standing x3 reps and side stepping towards top of bed, mod-max assist for RLE placement and RW management. Returned to supine w/ mod assist and adjusted bed into chair position for better airflow and to decrease work of breathing at rest. Provided nursing w/ handout of position to place bed in as pt is often found in more reclined/supine position. Ended session in supine, all needs in reach.   Therapy Documentation Precautions:  Precautions Precautions: Fall Precaution Comments: bruising along R side of neck Restrictions Weight Bearing Restrictions: No Vital Signs: Therapy Vitals Temp: 98.3 F (36.8 C) Temp Source: Oral Pulse Rate: 67 Resp: 18 BP: 103/68 Patient Position (if appropriate): Lying Oxygen Therapy SpO2: 100 % O2 Device: Nasal Cannula O2 Flow Rate (L/min): 2 L/min Pain: Pain Assessment Pain  Scale: 0-10 Pain Score: 0-No pain  Therapy/Group: Individual Therapy  Tarita Deshmukh Clent Demark 07/25/2018, 3:35 PM

## 2018-07-25 NOTE — Progress Notes (Signed)
Occupational Therapy Session Note  Patient Details  Name: Evan [name] MRN: [id number] Date of Birth: Nov 28, 1927  Today's Date: 07/25/2018 OT Individual Time: 1135-1210 OT Individual Time Calculation (min): 35 min    Short Term Goals: Week 1:  OT Short Term Goal 1 (Week 1): Pt will complete toilet transfer with max A  OT Short Term Goal 1 - Progress (Week 1): Met OT Short Term Goal 2 (Week 1): Pt will complete sit<>stand with max A in preparation for BADL tasks OT Short Term Goal 2 - Progress (Week 1): Met OT Short Term Goal 3 (Week 1): Pt will tolerate sitting EOB for 4 mins to increase endurance for BADL tasks OT Short Term Goal 3 - Progress (Week 1): Met Week 2:  OT Short Term Goal 1 (Week 2): Pt will complete toilet transfer with max A without Stedy OT Short Term Goal 2 (Week 2): Pt will completed 1 step of LB dressing task OT Short Term Goal 3 (Week 2): Pt will complete sit<>stand at the sink without Stedy with max A in preparation for BADL tasks      Skilled Therapeutic Interventions/Progress Updates:    Pt received in bed and he was very lethargic.  Pt stated "I need to pee". Placed urinal but pt unable to void.  Pt asked again 2nd time and unable to void. 3rd time, pt voided.  Battery replaced in hearing aid but it is not working.  Called son to let him know and that he is out of hearing aids.  Pt became more alert and worked on [employer] a/arom and bilateral hands on dowel bar for pushing pulling with mod A and max verbal cues.  Pt was able to actively grasp and flex elbow.   Pt in bed with all needs met, alarm on.    Therapy Documentation Precautions:  Precautions Precautions: Fall Precaution Comments: bruising along R side of neck Restrictions Weight Bearing Restrictions: No    Vital Signs: Therapy Vitals Pulse Rate: 68 BP: (!) 144/88 Oxygen Therapy SpO2: 100 % O2 Device: Nasal Cannula O2 Flow Rate (L/min): 2 L/min Pain: Pain Assessment Pain Scale: 0-10 Pain  Score: 0-No pain    Therapy/Group: Individual Therapy  [place] 07/25/2018, 12:46 PM

## 2018-07-26 ENCOUNTER — Inpatient Hospital Stay (HOSPITAL_COMMUNITY): Payer: Medicare HMO | Admitting: Speech Pathology

## 2018-07-26 ENCOUNTER — Inpatient Hospital Stay (HOSPITAL_COMMUNITY): Payer: Medicare HMO | Admitting: Occupational Therapy

## 2018-07-26 ENCOUNTER — Inpatient Hospital Stay (HOSPITAL_COMMUNITY): Payer: Medicare HMO | Admitting: Physical Therapy

## 2018-07-26 MED ORDER — IPRATROPIUM-ALBUTEROL 0.5-2.5 (3) MG/3ML IN SOLN
3.0000 mL | Freq: Four times a day (QID) | RESPIRATORY_TRACT | Status: DC | PRN
  Administered 2018-07-27: 3 mL via RESPIRATORY_TRACT
  Filled 2018-07-26: qty 3

## 2018-07-26 NOTE — Progress Notes (Signed)
Physical Therapy Session Note  Patient Details  Name: Evan [name] MRN: [id number] Date of Birth: 22-Mar-1928  Today's Date: 07/26/2018 PT Individual Time: 1140(15 min make up)-1240 PT Individual Time Calculation (min): 60 min   Short Term Goals: Week 2:  PT Short Term Goal 1 (Week 2): Pt will initiate w/c mobility  PT Short Term Goal 2 (Week 2): Pt will maintain dynamic sitting balance w/ min assist PT Short Term Goal 3 (Week 2): Pt will attend to RLE during functional mobility w/ min cues 50% of the time  Skilled Therapeutic Interventions/Progress Updates:   Pt in supine and agreeable to therapy w/ encouragement. Supine to sit w/ min assist and min assist to drink sips of water w/ hand-over-hand assist. Stand pivot to w/c w/ mod assist towards L side. Total assist w/c transport to/from therapy gym. Switched to lower w/c to allow pt to achieve greater L foot contact to ground to work on self-propelling w/c for increased independence w/ locomotion. Tactile, verbal, and visual cues for L hemi technique. Self-propelled 20' x2 w/ supervision and intermittent min assist. Increased SOB on room air, however O2 @ 98%. Re-applied nasal cannula on 2 L/min as that's what pt was on at rest, SOB resolved within a few minutes. Returned to room and assisted w/ sitting up in w/c to eat lunch to work on OOB/upright tolerance. Set-up assist w/ tray, otherwise supervision for remainder of meal. Verbal and visual cues to attend to entire tray, to swallow as pt w/ prolonged chewing, and for awareness of food falling off spoon or onto lap. Remained on 2 L/min O2 remainder of session w/ no SOB however pt using accessory muscles to breathe throughout session. Discussed sitting up in chair for 20 minutes until OT came, pt agreeable w/ encouragement. Ended session in w/c, all needs in reach. NT providing direct supervision for eating remainder of lunch.   Therapy Documentation Precautions:  Precautions Precautions:  Fall Precaution Comments: bruising along R side of neck Restrictions Weight Bearing Restrictions: No Vital Signs: Therapy Vitals Pulse Rate: 71 BP: 119/68  Therapy/Group: Individual Therapy  Ariyah Sedlack Clent Demark 07/26/2018, 12:52 PM

## 2018-07-26 NOTE — Patient Care Conference (Signed)
Inpatient RehabilitationTeam Conference and Plan of Care Update Date: 07/25/2018   Time: 2:42 PM    Patient Name: Evan [name]      Medical Record Number: [id number]  Date of Birth: [birth year] Sex: Male         Room/Bed: 4M04C/4M04C-01 Payor Info: Payor: [employer] MEDICARE / Plan: [place] HMO / Product Type: *No Product type* /    Admitting Diagnosis: ich  Admit Date/Time:  07/16/2018  2:46 PM Admission Comments: No comment available   Primary Diagnosis:  <principal problem not specified> Principal Problem: <principal problem not specified>  Patient Active Problem List   Diagnosis Date Noted  . Anxiety state   . Fatigue   . Labile blood glucose   . Hyponatremia   . SOB (shortness of breath)   . Leukocytosis   . Hyperglycemia   . Labile blood pressure   . History of lung cancer   . Depression   . CKD (chronic kidney disease), stage III ([place])   . Acute blood loss anemia   . Fall 07/16/2018  . 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 Acute spontaneous 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 ([place]) L thalamic 07/14/2018  . Cellulitis of arm, right 06/01/2018  . Rash and nonspecific skin eruption 06/01/2018  . Chest pain 02/02/2018  . Thrombocytopenia ([place]) 02/02/2018  . Hypokalemia 02/02/2018  . Pulmonary embolism ([place]) 02/02/2018  . Elevated troponin 02/02/2018  . Pulmonary embolus ([place]) 02/02/2018  . Non-ST elevation MI (NSTEMI) ([place]) 06/27/2017  . Diastolic CHF ([place]) 95/62/1308  . Chest pain due to CAD ([place]) 06/26/2017  . Dyspnea 06/21/2017  . Acute bronchitis 05/15/2017  . Oral candidiasis 05/15/2017  . Acute pericarditis   . Chest pain at rest 12/20/2016  . Angina pectoris ([place]) 12/20/2016  . Radiation pneumonitis ([place]) 11/09/2016  . Pleurisy 10/11/2016  . Chest wall pain 10/11/2016  . Atrial fibrillation ([place]) 05/21/2016  . Adenocarcinoma of right lung ([place]) 01/06/2016  . GAD (generalized  anxiety disorder) 11/20/2015  . Essential hypertension   . Cellulitis 09/04/2015  . Cellulitis of left axilla   . Esophageal ulcer without bleeding   . Bilateral lower extremity edema 04/28/2014  . BPH (benign prostatic hyperplasia) 10/08/2012  . Anxiety   . EKG abnormalities 09/05/2012  . Tremor 09/05/2012  . Chronic fatigue 09/05/2012  . Arthritis   . Asthma, chronic   . Reflux esophagitis   . S/P CABG (coronary artery bypass graft)   . GERD (gastroesophageal reflux disease)   . Hyperlipidemia   . Hypertension   . UNSPECIFIED PERIPHERAL VASCULAR DISEASE 06/16/2009    Expected Discharge Date: Expected Discharge Date: 08/15/18  Team Members Present: Physician leading conference: Dr. Delice Lesch Social Worker Present: Lennart Pall, LCSW Nurse Present: Rozetta Nunnery, RN PT Present: Burnard Bunting, PT OT Present: Mariane Masters, OT SLP Present: Weston Anna, SLP PPS Coordinator present : Gunnar Fusi     Current Status/Progress Goal Weekly Team Focus  Medical   Functional and cognitive deficits secondary to left thalamic hemorrhage.   Improve mobility, cognition, anxiety, CKD, BP, hyponatremia  See aboev   Bowel/Bladder   incontinent of b/b; LBM:04/21  continent of B/B  timed tolieting   Swallow/Nutrition/ Hydration   Dys. 2 textures with thin liquids, full supervision   Mod A  tolerance of current diet, use of swallowing strategies    ADL's   max/total A, can Stedy transfer mod A, improved activity tolerance  Min A  R  NMR,  modified bathing/dressing, activity tolernace, R coordination, sit<>stand   Mobility   min assist bed mobility, mod-max assist stand pivot transfer, stedy transfer w/ fatigue, gait +2 at rail 10', SOB much improved w/ activity  min assist overall  midline /postural control, OOB tolerance/endurance, transfers and gait   Communication             Safety/Cognition/ Behavioral Observations  Mod-Max A  Min-Mod A   orientation, problem solving   Pain    minimal pain; tylenol PRN for relief   free of pain   assess for pain qshift and prn    Skin   ecchymosis on right side of neck/chest ; skin tears x2 right arm (aquacell with foam); MASD on buttocks  resolution of current issues no further skin breakdown/infection; treatments as ordered  assess skin qshift anf prn; tx as ordered    Rehab Goals Patient on target to meet rehab goals: Yes *See Care Plan and progress notes for long and short-term goals.     Barriers to Discharge  Current Status/Progress Possible Resolutions Date Resolved   Physician    Medical stability;Behavior     See above  Therapies, follow labs, anxiety meds      Nursing                  PT                    OT                  SLP                SW                Discharge Planning/Teaching Needs:  Pt to return home with family providing 24/7 assistance.    Teaching to be planned closer to d/c.   Team Discussion:  Very HOH!;  CKI - monitor labs.  incont b/b and still requiring telesitter.  Bruising is significant.  Appears to be doing better overall with respiration with scheduled neb txs.  Max - total with ADLs and, at best, can be mod assist.  Min assist w/c level goals.  On D2, thin with plans to trial upgraded textures.  Will need family ed due to pt needing regular cues and pushing.  Revisions to Treatment Plan:  NA    Continued Need for Acute Rehabilitation Level of Care: The patient requires daily medical management by a physician with specialized training in physical medicine and rehabilitation for the following conditions: Daily direction of a multidisciplinary physical rehabilitation program to ensure safe treatment while eliciting the highest outcome that is of practical value to the patient.: Yes Daily medical management of patient stability for increased activity during participation in an intensive rehabilitation regime.: Yes Daily analysis of laboratory values and/or radiology reports with any  subsequent need for medication adjustment of medical intervention for : Renal problems;Other;Blood pressure problems;Neurological problems   I attest that I was present, lead the team conference, and concur with the assessment and plan of the team.   Lennart Pall 07/26/2018, 2:42 PM    Team conference was held via web/ teleconference due to [place] - 17

## 2018-07-26 NOTE — Progress Notes (Signed)
Occupational Therapy Session Note  Patient Details  Name: Evan [name] MRN: [id number] Date of Birth: 02/12/1928  Today's Date: 07/26/2018 OT Individual Time: 1310-1350 OT Individual Time Calculation (min): 40 min   Short Term Goals: Week 2:  OT Short Term Goal 1 (Week 2): Pt will complete toilet transfer with max A without Stedy OT Short Term Goal 2 (Week 2): Pt will completed 1 step of LB dressing task OT Short Term Goal 3 (Week 2): Pt will complete sit<>stand at the sink without Stedy with max A in preparation for BADL tasks  Skilled Therapeutic Interventions/Progress Updates:    Pt greeted seated in wc and agreeable to OT treatment session. Pt very fatigued from previous sessions and from sitting up. Nurse tech was assisting pt with urinal. Pt agreeable to bathing at the sink. UB bathing completed with focus on functional use of R UE. Pt needed hand over hand A to maintain grip on wash cloth on R UE and facilitation to initiate. Pt needed max A to change gown 2.2 fatigue. Bariatric Stedy used for sit<>stand with pt needing 2 trials as R LE slipped off of stedy. OT positioned R LE and placed R UE onto stedy handle, then pt able to stand with mod A. OT provided verbal cues to promote midline orientation, but pt still with push to the R. OT able to block body on R side and maneuver Stedy to the EOB. Worked on sitting balance EOB by having pt lean down onto R elbow to decrease pushing. OT assisted pt back into supine. OT changed out wc for TIS wc to try at next therapy session. Bed alarm on and call bell in reach.   Therapy Documentation Precautions:  Precautions Precautions: Fall Precaution Comments: bruising along R side of neck Restrictions Weight Bearing Restrictions: No Pain:  Denies pain  Therapy/Group: Individual Therapy  Valma Cava 07/26/2018, 1:50 PM

## 2018-07-26 NOTE — Progress Notes (Signed)
Speech Language Pathology Daily Session Note  Patient Details  Name: Evan [name] MRN: [id number] Date of Birth: 1927-06-30  Today's Date: 07/26/2018 SLP Individual Time: 0725-0820 SLP Individual Time Calculation (min): 55 min  Short Term Goals: Week 2: SLP Short Term Goal 1 (Week 2): Pt will demonstrate orientation x4 with min verbal cues. SLP Short Term Goal 2 (Week 2): Pt will complete basic problem solving tasks with min assist given verbal cues. SLP Short Term Goal 3 (Week 2): Patient will consume current diet with minimal overt s/s of aspiration and overall Min A verbal cues for use of swallowing compensatory strategies.   Skilled Therapeutic Interventions: Skilled treatment session focused on cognitive and dysphagia goals. Upon arrival, patient requested to get up. Patient sat EOB with Mod I and was then transferred to the recliner via the stedy with +2 assist due to strong lateral lean to the right. Patient consumed his breakfast meal of Dys. 2 textures with thin liquids without overt s/s of aspiration but demonstrated prolonged mastication and Mod A verbal cues for problem solving during self-feeding and to self-monitor and correct moderate oral residue. Patient also appeared to demonstrate mildly increased work of breathing towards end of meal, suspect due to decreased endurance/fatigue. SLP hopeful for trials of advanced solids, however, patient continues to demonstrate moderate oral residue with cues needed for clearance and decreased endurance throughout meals. Therefore, recommend patient continue current diet. Patient left upright in recliner with alarm on and all needs within reach. Continue with current plan of care.      Pain No/Denies Pain   Therapy/Group: Individual Therapy  Deron Poole 07/26/2018, 8:51 AM

## 2018-07-26 NOTE — Progress Notes (Signed)
[facility] PHYSICAL MEDICINE & REHABILITATION PROGRESS NOTE  Subjective/Complaints: Patient seen laying in bed this morning, working with SLP.Marland Kitchen  He slept fairly overnight per sleep chart.  He states he had a "rough" night, but does not identify any particular cause.  ROS: Limited due to [facility], but appears to deny CP, SOB, N/V/D.  Objective: Vital Signs: Blood pressure 125/77, pulse 80, temperature 98.9 F (37.2 C), resp. rate 20, height 5\' 5"  (1.651 m), weight 79.1 kg, SpO2 97 %. No results found. Recent Labs    07/23/18 0957  WBC 14.9*  HGB 10.9*  HCT 33.6*  PLT 179   Recent Labs    07/23/18 0957  NA 133*  K 4.5  CL 102  CO2 18*  GLUCOSE 123*  BUN 41*  CREATININE 1.38*  CALCIUM 9.2    Physical Exam: BP 125/77 (BP Location: Left Arm)   Pulse 80   Temp 98.9 F (37.2 C)   Resp 20   Ht 5\' 5"  (1.651 m)   Wt 79.1 kg   SpO2 97%   BMI 29.02 kg/m  Constitutional: No distress . Vital signs reviewed. HENT: Normocephalic.  Atraumatic. Eyes: EOMI.  No discharge. Cardiovascular: No JVD. Respiratory: Normal effort.  +[place]. GI: Non-distended. Musc: No edema or tenderness in extremities. Neurological:  He isalert. Extremely HOH.  Motor:  RUE: Shoulder abduction 4/5, elbow flexion/extension 4/5, handgrip 4/5 Right lower extremity: Hip flexion, knee extension 3+-4-/5, ankle dorsiflexion 4-/5 Skin: Right neck with ecchymosis and hard indurated area under chin, improving.  Psychiatric: Appears to have normal behavior and affect.   Assessment/Plan: 1. 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  Physiatrist is providing close team supervision and 24 hour management of active medical problems listed below.  Physiatrist and rehab team continue to assess barriers to discharge/monitor patient progress toward functional and medical goals  Care  Tool:  Bathing    Body parts bathed by patient: Left arm, Chest, Abdomen, Front perineal area   Body parts bathed by helper: Right arm, Left arm, Chest, Abdomen, Front perineal area, Right lower leg, Left upper leg, Right upper leg, Buttocks, Left lower leg, Face     Bathing assist Assist Level: Maximal Assistance - Patient 24 - 49%     Upper Body Dressing/Undressing Upper body dressing Upper body dressing/undressing activity did not occur (including orthotics): N/A What is the patient wearing?: Pull over shirt    Upper body assist Assist Level: Moderate Assistance - Patient 50 - 74%    Lower Body Dressing/Undressing Lower body dressing    Lower body dressing activity did not occur: N/A What is the patient wearing?: Incontinence brief, Pants     Lower body assist Assist for lower body dressing: Total Assistance - Patient < 25%     Toileting Toileting Toileting Activity did not occur Landscape architect and hygiene only): N/A (no void or bm)  Toileting assist Assist for toileting: Maximal Assistance - Patient 25 - 49%     Transfers Chair/bed transfer  Transfers assist  Chair/bed transfer activity did not occur: Safety/medical concerns  Chair/bed transfer assist level: Moderate Assistance - Patient 50 - 74%     Locomotion Ambulation   Ambulation assist   Ambulation activity did not occur: Safety/medical concerns  Assist level: Maximal Assistance - Patient 25 - 49% Assistive device: Other (comment)(rail in hallway) Max distance: 10'   Walk 10 feet activity   Assist  Walk  10 feet activity did not occur: Safety/medical concerns  Assist level: Maximal Assistance - Patient 25 - 49% Assistive device: Other (comment)(rail in hallway)   Walk 50 feet activity   Assist Walk 50 feet with 2 turns activity did not occur: Safety/medical concerns         Walk 150 feet activity   Assist Walk 150 feet activity did not occur: Safety/medical concerns          Walk 10 feet on uneven surface  activity   Assist Walk 10 feet on uneven surfaces activity did not occur: Safety/medical concerns         Wheelchair     Assist Will patient use wheelchair at discharge?: Yes Type of Wheelchair: Manual    Wheelchair assist level: Dependent - Patient 0%      Wheelchair 50 feet with 2 turns activity    Assist        Assist Level: Dependent - Patient 0%   Wheelchair 150 feet activity     Assist     Assist Level: Dependent - Patient 0%      Medical Problem List and Plan: 1.Functional and cognitive 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  Cont CIR  CT head performed on 4/20 showing slight improvement.  Please see PA note-discussed with neurology, plan for repeat CT in 1 week (4/27) to determine plans for anticoagulation. 2. Antithrombotics: -PE/LLEDVT/anticoagulation:None due to [place] -antiplatelet therapy: N/A 3. Pain Management:tylenol prn. 4. Mood:LCSW to follow for evaluation and support. antipsychotic agents: N/A.   Ativan tid PTA, increased to 0.75 TID on 4/21  Prozac. 5. Neuropsych: This patientis not fullycapable of making decisions onhisown behalf. 6. Skin/Wound Care:Monitor right neck hematoma for resolution. 7. Fluids/Electrolytes/Nutrition:Monitor I/O.  8.Thrombocytopenia/ ABLA: Continue to monitor H/H/Platelets with serial checks. Monitor for signs of bleeding.   Hemoglobin 10.9 on 4/20, labs ordered for tomorrow  Platelets 179 on 4/20, labs ordered for tomorrow  Continue to monitor 9. Right forearm wound/ recent cellulitis:Completed course of Keflex on 4/16. Added Aqacell to wound as per derm records. 10.CKD stage III: Avoid nephrotoxic medications. Monitor with serial checks.   Creatinine 1.38 on 4/20, labs ordered for tomorrow  Encourage fluids  Continue to monitor 11. H/o A fib: Monitor HR bid. Continue metoprolol bid--off Lovenox.  12. H/o  Depression: On Prozac daily with ativan tid.  39. H/o Lung cancer: Had PE on Eliquis. No antiplatelets or anticoagulation for now.   4/18 CXR reviewed, suggesting improvement  CTA on 4/18- PE resolved, progression of right hilum abnormality, chronic radiation scarring  Continue steroids for now  Added Mucinex dm to help with cough/clearance  -encourage IS/FV  -pulmonary toilet 14. Labile blood pressure  Relatively controlled on 4/23 15.  Steroid-induced hyperglycemia  Monitor with steroids 16. Leukocytosis: Likely steroid induced  WBCs 14.9 on 4/20, labs ordered for tomorrow  Afebrile  UA negative, urine culture not collected 17.  Hyponatremia  Sodium 133 on 4/20, labs ordered for tomorrow  LOS: 10 days A FACE TO FACE EVALUATION WAS PERFORMED  Orella Cushman Lorie Phenix 07/26/2018, 9:38 AM

## 2018-07-27 ENCOUNTER — Inpatient Hospital Stay (HOSPITAL_COMMUNITY): Payer: Medicare HMO | Admitting: Occupational Therapy

## 2018-07-27 ENCOUNTER — Inpatient Hospital Stay (HOSPITAL_COMMUNITY): Payer: Medicare HMO | Admitting: Speech Pathology

## 2018-07-27 ENCOUNTER — Inpatient Hospital Stay (HOSPITAL_COMMUNITY): Payer: Medicare HMO | Admitting: Physical Therapy

## 2018-07-27 DIAGNOSIS — D72828 Other elevated white blood cell count: Secondary | ICD-10-CM

## 2018-07-27 LAB — BASIC METABOLIC PANEL
Anion gap: 11 (ref 5–15)
BUN: 38 mg/dL — ABNORMAL HIGH (ref 8–23)
CO2: 18 mmol/L — ABNORMAL LOW (ref 22–32)
Calcium: 8.9 mg/dL (ref 8.9–10.3)
Chloride: 106 mmol/L (ref 98–111)
Creatinine, Ser: 1.49 mg/dL — ABNORMAL HIGH (ref 0.61–1.24)
GFR calc Af Amer: 48 mL/min — ABNORMAL LOW (ref >60)
GFR calc non Af Amer: 41 mL/min — ABNORMAL LOW (ref >60)
Glucose, Bld: 108 mg/dL — ABNORMAL HIGH (ref 70–99)
Potassium: 4.3 mmol/L (ref 3.5–5.1)
Sodium: 135 mmol/L (ref 135–145)

## 2018-07-27 LAB — CBC WITH DIFFERENTIAL/PLATELET
Abs Immature Granulocytes: 0.55 10*3/uL — ABNORMAL HIGH (ref 0.00–0.07)
Basophils Absolute: 0 10*3/uL (ref 0.0–0.1)
Basophils Relative: 0 %
Eosinophils Absolute: 0.2 10*3/uL (ref 0.0–0.5)
Eosinophils Relative: 2 %
HCT: 35.6 % — ABNORMAL LOW (ref 39.0–52.0)
Hemoglobin: 11.9 g/dL — ABNORMAL LOW (ref 13.0–17.0)
Immature Granulocytes: 4 %
Lymphocytes Relative: 9 %
Lymphs Abs: 1.2 10*3/uL (ref 0.7–4.0)
MCH: 31.4 pg (ref 26.0–34.0)
MCHC: 33.4 g/dL (ref 30.0–36.0)
MCV: 93.9 fL (ref 80.0–100.0)
Monocytes Absolute: 1.3 10*3/uL — ABNORMAL HIGH (ref 0.1–1.0)
Monocytes Relative: 10 %
Neutro Abs: 9.6 10*3/uL — ABNORMAL HIGH (ref 1.7–7.7)
Neutrophils Relative %: 75 %
Platelets: 179 10*3/uL (ref 150–400)
RBC: 3.79 MIL/uL — ABNORMAL LOW (ref 4.22–5.81)
RDW: 13.6 % (ref 11.5–15.5)
WBC: 12.9 10*3/uL — ABNORMAL HIGH (ref 4.0–10.5)
nRBC: 0.2 % (ref 0.0–0.2)

## 2018-07-27 NOTE — Progress Notes (Signed)
Social Work Patient ID: Evan [name], male   DOB: 05-02-1927, 85 y.o.   MRN: [id number]   Have reviewed team conference with pt's daughter, [name], who is aware and agreeable with targeted d/c date of 5/13 and target goals of min assist w/c level.  She is disappointed that team does not feel he will be ambulating, however, notes family prepared to provide any assistance needed.  Discussed need for family ed closer to d/c date.  Continue to follow.  Duc Crocket, LCSW

## 2018-07-27 NOTE — Progress Notes (Signed)
Physical Therapy Session Note  Patient Details  Name: Evan [name] MRN: [id number] Date of Birth: [birth year]  Today's Date: 07/27/2018 PT Individual Time: 9935-7017 PT Individual Time Calculation (min): 40 min   Short Term Goals: Week 2:  PT Short Term Goal 1 (Week 2): Pt will initiate w/c mobility  PT Short Term Goal 2 (Week 2): Pt will maintain dynamic sitting balance w/ min assist PT Short Term Goal 3 (Week 2): Pt will attend to RLE during functional mobility w/ min cues 50% of the time  Skilled Therapeutic Interventions/Progress Updates:   Pt in supine and agreeable to therapy, no c/o pain. Supine to sit w/ min assist and maintained static sitting balance while therapist set-up transfer w/ close supervision and frequent verbal reminders to maintain balance. Mod assist stand pivot towards L side w/ verbal and tactile cues for R quad activation in stance. Total assist w/c transport to/from therapy gym. Worked on gait training at rail. Ambulated 10' and 15' w/ w/c follow and max assist overall. Manual assist needed for lateral weight shifting, for upright posture, and for RLE placement 2/2 scissoring and adductor tone. No assist needed to block R knee and overall pt performing more percentage of gait task than when previously attempted w/ this therapist. Pt noted to have BM after last gait bout. Returned to room, mod-max stand pivot to L back to EOB. R/L rolling w/ min assist while 2nd helper performed pericare and brief management. Had to perform multiple reps of rolling 2/2 needing to change sheet as it had been soiled. Pt w/ moderate increase in work of breathing w/ this task, taking longer to recover as he had been in supine for prolonged period of time. Difficulty keeping eyes open. Ended session in supine and resting comfortably. Briefly monitored by this therapist for breathing to return to baseline. Missed 20 min of skilled PT 2/2 fatigue.   Therapy Documentation Precautions:   Precautions Precautions: Fall Precaution Comments: bruising along R side of neck Restrictions Weight Bearing Restrictions: No  Therapy/Group: Individual Therapy  [name] Clent Demark 07/27/2018, 9:48 AM

## 2018-07-27 NOTE — Progress Notes (Signed)
Speech Language Pathology Daily Session Note  Patient Details  Name: Evan [name] MRN: [id number] Date of Birth: [birth year]  Today's Date: 07/27/2018 SLP Individual Time: 0815-0840 SLP Individual Time Calculation (min): 25 min  Short Term Goals: Week 2: SLP Short Term Goal 1 (Week 2): Pt will demonstrate orientation x4 with min verbal cues. SLP Short Term Goal 2 (Week 2): Pt will complete basic problem solving tasks with min assist given verbal cues. SLP Short Term Goal 3 (Week 2): Patient will consume current diet with minimal overt s/s of aspiration and overall Min A verbal cues for use of swallowing compensatory strategies.   Skilled Therapeutic Interventions: Skilled treatment session focused on cognitive goals. Upon arrival, patient was upright in bed but appeared lethargic. Patient with minimal oral residue, suspect from breakfast, which patient required Mod verbal cues for problem solving and thoroughness with oral care via the suction toothbrush. Patient also required Min verbal cues for thoroughness with washing his face. Patient with increased difficulty with auditory comprehension due to severe HOH. Daughter contacted and reported she was planning to bring additional hearing aid and cleaning kit. Patient left upright in bed with alarm on and all needs within reach. Continue with current plan of care.        Pain No/Denies Pain   Therapy/Group: Individual Therapy  Clarabel Marion 07/27/2018, 12:26 PM

## 2018-07-27 NOTE — Progress Notes (Signed)
Occupational Therapy Session Note  Patient Details  Name: Evan [name] MRN: [id number] Date of Birth: 1927-09-18  Today's Date: 07/27/2018 OT Individual Time: 1110-1150 OT Individual Time Calculation (min): 40 min  and Today's Date: 07/27/2018 OT Missed Time: 20 Minutes Missed Time Reason: Patient fatigue   Short Term Goals: Week 2:  OT Short Term Goal 1 (Week 2): Pt will complete toilet transfer with max A without Stedy OT Short Term Goal 2 (Week 2): Pt will completed 1 step of LB dressing task OT Short Term Goal 3 (Week 2): Pt will complete sit<>stand at the sink without Stedy with max A in preparation for BADL tasks  Skilled Therapeutic Interventions/Progress Updates:    Treatment session with focus on bed mobility, scooting, sit > stand, and transfers.  Pt received supine in bed asleep requiring increased time to fully arouse.  Pt completed bed mobility with min-mod assist and therapist management of RUE and RLE during mobility.  Once seated at EOB pt reports need to void urine, therefore setup with urinal.  Pt unsuccessful, but noticed to have already been incontinent of urine and small amount of stool. Therefore returned to bed and engaged in hygiene while rolling Rt and Lt.  Returned to sitting EOB and engaged in lateral scoots to Rt and Lt, pt demonstrating increased difficulty when attempting to scoot to Rt.  Engaged in sit > stand with min assist with pt able to tolerate standing and engaged in weight shifting in standing to increase weight shift to Rt to increase ability to transfer to Rt.  Attempted squat pivot and stand pivot transfers to Rt with pt unable to weight shift to allow transfer to Rt.  Pt reports fatigue and cold, therefore returned to bed and left upright in chair position with blankets tucked around him and all needs in reach.  Therapy Documentation Precautions:  Precautions Precautions: Fall Precaution Comments: bruising along R side of neck Restrictions Weight  Bearing Restrictions: No General: General OT Amount of Missed Time: 20 Minutes PT Missed Treatment Reason: Patient fatigue Vital Signs: Therapy Vitals Pulse Rate: 67 BP: 114/76 Pain:  Pt with no c/o pain   Therapy/Group: Individual Therapy  Simonne Come 07/27/2018, 12:15 PM

## 2018-07-27 NOTE — Progress Notes (Signed)
[place] PHYSICAL MEDICINE & REHABILITATION PROGRESS NOTE  Subjective/Complaints: Patient seen sitting up in bed this morning.  He slept fairly per sleep chart.  He states he did not sleep well at all.  Per nursing patient has questions for me, however when asked patient only wants to know his discharge date.  ROS: Limited due to [facility], but appears to deny CP, SOB, N/V/D.  Objective: Vital Signs: Blood pressure 124/67, pulse 67, temperature 97.7 F (36.5 C), temperature source Oral, resp. rate 16, height 5\' 5"  (1.651 m), weight 76.6 kg, SpO2 96 %. No results found. Recent Labs    07/27/18 0553  WBC 12.9*  HGB 11.9*  HCT 35.6*  PLT 179   Recent Labs    07/27/18 0553  NA 135  K 4.3  CL 106  CO2 18*  GLUCOSE 108*  BUN 38*  CREATININE 1.49*  CALCIUM 8.9    Physical Exam: BP 124/67 (BP Location: Left Arm)   Pulse 67   Temp 97.7 F (36.5 C) (Oral)   Resp 16   Ht 5\' 5"  (1.651 m)   Wt 76.6 kg   SpO2 96%   BMI 28.10 kg/m  Constitutional: No distress . Vital signs reviewed. HENT: Normocephalic.  Atraumatic. Eyes: EOMI.  No discharge. Cardiovascular: No JVD. Respiratory: Normal effort.  +Gilman. GI: Non-distended. Musc: No edema or tenderness in extremities. Neurological:  He isalert. Extremely HOH.  Motor:  RUE: Shoulder abduction 4/5, elbow flexion/extension 4/5, handgrip 4/5, stable Right lower extremity: Hip flexion, knee extension 3+-4-/5, ankle dorsiflexion 4-/5, stable Skin: Right neck with ecchymosis and hard indurated area under chin, improving.  Psychiatric: Appears to have normal behavior and affect.   Assessment/Plan: 1. 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  Physiatrist is providing close team supervision and 24 hour management of active medical problems listed below.  Physiatrist and rehab team continue to assess barriers to  discharge/monitor patient progress toward functional and medical goals  Care Tool:  Bathing    Body parts bathed by patient: Left arm, Chest, Abdomen, Front perineal area   Body parts bathed by helper: Right arm, Left arm, Chest, Abdomen, Front perineal area, Right lower leg, Left upper leg, Right upper leg, Buttocks, Left lower leg, Face     Bathing assist Assist Level: Maximal Assistance - Patient 24 - 49%     Upper Body Dressing/Undressing Upper body dressing Upper body dressing/undressing activity did not occur (including orthotics): N/A What is the patient wearing?: Pull over shirt    Upper body assist Assist Level: Moderate Assistance - Patient 50 - 74%    Lower Body Dressing/Undressing Lower body dressing    Lower body dressing activity did not occur: N/A What is the patient wearing?: Incontinence brief, Pants     Lower body assist Assist for lower body dressing: Total Assistance - Patient < 25%     Toileting Toileting Toileting Activity did not occur Landscape architect and hygiene only): N/A (no void or bm)  Toileting assist Assist for toileting: Maximal Assistance - Patient 25 - 49%     Transfers Chair/bed transfer  Transfers assist  Chair/bed transfer activity did not occur: Safety/medical concerns  Chair/bed transfer assist level: Moderate Assistance - Patient 50 - 74%     Locomotion Ambulation   Ambulation assist   Ambulation activity did not occur: Safety/medical concerns  Assist level: Maximal Assistance - Patient 25 - 49% Assistive device: Other (  comment)(rail in hallway) Max distance: 10'   Walk 10 feet activity   Assist  Walk 10 feet activity did not occur: Safety/medical concerns  Assist level: Maximal Assistance - Patient 25 - 49% Assistive device: Other (comment)(rail in hallway)   Walk 50 feet activity   Assist Walk 50 feet with 2 turns activity did not occur: Safety/medical concerns         Walk 150 feet  activity   Assist Walk 150 feet activity did not occur: Safety/medical concerns         Walk 10 feet on uneven surface  activity   Assist Walk 10 feet on uneven surfaces activity did not occur: Safety/medical concerns         Wheelchair     Assist Will patient use wheelchair at discharge?: Yes Type of Wheelchair: Manual    Wheelchair assist level: Supervision/Verbal cueing Max wheelchair distance: 20'    Wheelchair 50 feet with 2 turns activity    Assist        Assist Level: Dependent - Patient 0%   Wheelchair 150 feet activity     Assist     Assist Level: Dependent - Patient 0%      Medical Problem List and Plan: 1.Functional and cognitive 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  Cont CIR  CT head performed on 4/20 showing slight improvement.  Please see PA note-discussed with neurology, plan for repeat CT in 1 week (4/27) to determine plans for anticoagulation. 2. Antithrombotics: -PE/LLEDVT/anticoagulation:None due to [place].  PE resolved on most recent CTA. -antiplatelet therapy: N/A 3. Pain Management:tylenol prn. 4. Mood:LCSW to follow for evaluation and support. antipsychotic agents: N/A.   Ativan tid PTA, increased to 0.75 TID on 4/21  Prozac. 5. Neuropsych: This patientis not fullycapable of making decisions onhisown behalf. 6. Skin/Wound Care:Monitor right neck hematoma for resolution. 7. Fluids/Electrolytes/Nutrition:Monitor I/O.  8.ABLA: Continue to monitor H/H/Platelets with serial checks. Monitor for signs of bleeding.   Hemoglobin 11.9 on 4/24  Continue to monitor 9. Right forearm wound/ recent cellulitis:Completed course of Keflex on 4/16. Added Aqacell to wound as per derm records. 10.CKD stage III: Avoid nephrotoxic medications. Monitor with serial checks.   Creatinine 1.49 on 4/24  Encourage fluids  Continue to monitor 11. H/o A fib: Monitor HR bid. Continue  metoprolol bid--off Lovenox.  12. H/o Depression: On Prozac daily with ativan tid.  32. H/o Lung cancer: Had PE on Eliquis. No antiplatelets or anticoagulation for now.   4/18 CXR reviewed, suggesting improvement  CTA on 4/18- PE resolved, progression of right hilum abnormality, chronic radiation scarring  Continue steroids for now, will consider decreasing next week  Added Mucinex dm to help with cough/clearance  -encourage IS/FV  -pulmonary toilet 14. Labile blood pressure  Controlled on 4/24 15.  Steroid-induced hyperglycemia  Monitor with steroids 16. Leukocytosis: Likely steroid induced  WBCs 12.9 on 4/24  Afebrile  UA negative, urine culture not collected 17.  Hyponatremia  Sodium 135 on 4/24  LOS: 11 days A FACE TO FACE EVALUATION WAS PERFORMED  Ankit Lorie Phenix 07/27/2018, 9:18 AM

## 2018-07-28 ENCOUNTER — Inpatient Hospital Stay (HOSPITAL_COMMUNITY): Payer: Medicare HMO | Admitting: Occupational Therapy

## 2018-07-28 ENCOUNTER — Inpatient Hospital Stay (HOSPITAL_COMMUNITY): Payer: Medicare HMO | Admitting: Physical Therapy

## 2018-07-28 NOTE — Plan of Care (Signed)
  Problem: Consults Goal: RH STROKE PATIENT EDUCATION Description See Patient Education module for education specifics  Outcome: Progressing Goal: Nutrition Consult-if indicated Outcome: Progressing Goal: Diabetes Guidelines if Diabetic/Glucose > 140 Description If diabetic or lab glucose is > 140 mg/dl - Initiate Diabetes/Hyperglycemia Guidelines & Document Interventions  Outcome: Progressing   Problem: RH BOWEL ELIMINATION Goal: RH STG MANAGE BOWEL WITH ASSISTANCE Description STG Manage Bowel with Mod Assistance.   Outcome: Progressing Goal: RH STG MANAGE BOWEL W/MEDICATION W/ASSISTANCE Description STG Manage Bowel with Medication with Assistance. mod  Outcome: Progressing   Problem: RH BLADDER ELIMINATION Goal: RH STG MANAGE BLADDER WITH ASSISTANCE Description STG Manage Bladder With Mod Assistance   Outcome: Progressing Goal: RH STG MANAGE BLADDER WITH EQUIPMENT WITH ASSISTANCE Description STG Manage Bladder With Equipment With  Mod Assistance   Outcome: Progressing   Problem: RH SKIN INTEGRITY Goal: RH STG SKIN FREE OF INFECTION/BREAKDOWN Description Monitor skin for breakdown and skin tears during admission. No breakdown with mod assist   Outcome: Progressing Goal: RH STG MAINTAIN SKIN INTEGRITY WITH ASSISTANCE Description STG Maintain Skin Integrity With Mod Assistance.   Outcome: Progressing Goal: RH STG ABLE TO PERFORM INCISION/WOUND CARE W/ASSISTANCE Description STG Able To Perform Incision/Wound Care With Mod Assistance.   Outcome: Progressing   Problem: RH SAFETY Goal: RH STG ADHERE TO SAFETY PRECAUTIONS W/ASSISTANCE/DEVICE Description STG Adhere to Safety Precautions With Mod Assistance/Device.   Outcome: Progressing   Problem: RH COGNITION-NURSING Goal: RH STG USES MEMORY AIDS/STRATEGIES W/ASSIST TO PROBLEM SOLVE Description STG Uses Memory Aids/Strategies With Mod Assistance to Problem Solve.   Outcome: Progressing Goal: RH STG ANTICIPATES  NEEDS/CALLS FOR ASSIST W/ASSIST/CUES Description STG Anticipates Needs/Calls for Assist With  Mod Assistance/Cues.   Outcome: Progressing   Problem: RH PAIN MANAGEMENT Goal: RH STG PAIN MANAGED AT OR BELOW PT'S PAIN GOAL Description Patient will be pain free or pain less than 3 during admission  Outcome: Progressing

## 2018-07-28 NOTE — Progress Notes (Signed)
Physical Therapy Session Note  Patient Details  Name: Evan [name] MRN: [id number] Date of Birth: February 19, 1928  Today's Date: 07/28/2018 PT Individual Time: 1405-1450 PT Individual Time Calculation (min): 45 min   Short Term Goals: Week 2:  PT Short Term Goal 1 (Week 2): Pt will initiate w/c mobility  PT Short Term Goal 2 (Week 2): Pt will maintain dynamic sitting balance w/ min assist PT Short Term Goal 3 (Week 2): Pt will attend to RLE during functional mobility w/ min cues 50% of the time Week 3:     Skilled Therapeutic Interventions/Progress Updates:   Pt received supine in bed. Asleep, and aroused with effort. Supine>sit transfer with max assist due to lack of desire to participate in therpay. Sitting balacne EOB x 5 minutes with mod assist progressing to CGA to prevent R LOB. Squat pivot transfer to the L with max assist and max cues for improved weight shifting to the L. Pt transported to day room in [facility]. kinetron reciprocal movement training and forced use of the RLE. Pt noted to have extremely labored breathing throughout all NMR and while resting between bouts of 1 min at 40cm/sec. Pt noted to have short bouts with no breath sounds noted, but no signs of distress. Between bouts, pt noted to fall asleep with break of more than 10 seconds. Due to excessive labored breathing and significant fatigue, pt returned to room in [facility]. Squat pivot transfer to bed with UE support on bed rail and max assist from PT. Sit>supine with moderate assist from PT for safety and RLE management. Pt left in bed with call bell in reach and all needs met.       Therapy Documentation Precautions:  Precautions Precautions: Fall Precaution Comments: bruising along R side of neck Restrictions Weight Bearing Restrictions: No General: PT Amount of Missed Time (min): 15 Minutes Vital Signs: Therapy Vitals Temp: 97.8 F (36.6 C) Temp Source: Oral Pulse Rate: 67 Resp: 19 BP: 129/74 Patient Position (if  appropriate): Lying Oxygen Therapy SpO2: 97 % O2 Device: Room Air Pain: Faces: mild pain.    Therapy/Group: Individual Therapy  Lorie Phenix 07/28/2018, 5:42 PM

## 2018-07-28 NOTE — Plan of Care (Signed)
  Problem: Consults Goal: RH STROKE PATIENT EDUCATION Description See Patient Education module for education specifics  Outcome: Progressing   Problem: RH SKIN INTEGRITY Goal: RH STG SKIN FREE OF INFECTION/BREAKDOWN Description Monitor skin for breakdown and skin tears during admission. No breakdown with mod assist   Outcome: Progressing   Problem: RH SAFETY Goal: RH STG ADHERE TO SAFETY PRECAUTIONS W/ASSISTANCE/DEVICE Description STG Adhere to Safety Precautions With Mod Assistance/Device.   Outcome: Progressing   Problem: RH PAIN MANAGEMENT Goal: RH STG PAIN MANAGED AT OR BELOW PT'S PAIN GOAL Description Patient will be pain free or pain less than 3 during admission  Outcome: Progressing

## 2018-07-28 NOTE — Progress Notes (Signed)
Occupational Therapy Session Note  Patient Details  Name: Evan [name] MRN: [id number] Date of Birth: Dec 12, 1927  Today's Date: 07/28/2018 OT Individual Time: 0900-10:05 65 minutes total....missed 10 minutes due to fatigue and grogginess  Skilled Therapeutic Interventions/Progress Updates: focusn as follows:    Patient very hard of hearing yet still with some disorientation to situation.     Patient finishing breakfast upon start of session.   Then he continued to drift in and out of sleep.   Focus this session was to keep patient aroused and participating and task completion.    Though falling asleep thorugh of session, he was easy to arouse.   He did hear his phone ring during the session and stopped brushing his teeth and talk with the toothbrush in his mouth.   He complete oral care with hand over hand assist to use right hand.   He demonstrated some right inattention but was able to use his right UE grossly with approximately 2- muscle strength.   He appeared to neglect use of it until prompted.   He did complete pericleansing and tops of legs, through he required rearousing, lacked core strength and initiation to reach lower for cleansing.     He completed lateral rolls to complete pericleansing.     Patient was left side lying at the end of the session, as his buttock and scrotum were very red in color.   Prescription cream was applied and RN made aware.  Patient fell back to sleep by the end of the session.  Continue OT Plan of care.     Therapy Documentation Precautions:  Precautions Precautions: Fall Precaution Comments: bruising along R side of neck Restrictions Weight Bearing Restrictions: No  Pain: Pain Assessment Pain Scale: 0-10 Pain Score: 0-No pain  Therapy/Group: Individual Therapy  [name] [facility] 07/28/2018, 2:21 PM

## 2018-07-28 NOTE — Progress Notes (Signed)
[place] PHYSICAL MEDICINE & REHABILITATION PROGRESS NOTE  Subjective/Complaints:  Very HOH and confused, calling out " I have to pee", cannot use call bell even after instruction   ROS: Limited due to [facility], but appears to deny CP, SOB, N/V/D.  Objective: Vital Signs: Blood pressure 118/71, pulse 68, temperature 98.4 F (36.9 C), temperature source Axillary, resp. rate (!) 22, height 5\' 5"  (1.651 m), weight 76.6 kg, SpO2 100 %. No results found. Recent Labs    07/27/18 0553  WBC 12.9*  HGB 11.9*  HCT 35.6*  PLT 179   Recent Labs    07/27/18 0553  NA 135  K 4.3  CL 106  CO2 18*  GLUCOSE 108*  BUN 38*  CREATININE 1.49*  CALCIUM 8.9    Physical Exam: BP 118/71 (BP Location: Left Arm)   Pulse 68   Temp 98.4 F (36.9 C) (Axillary)   Resp (!) 22   Ht 5\' 5"  (1.651 m)   Wt 76.6 kg   SpO2 100%   BMI 28.10 kg/m  Constitutional: No distress . Vital signs reviewed. HENT: Normocephalic.  Atraumatic. Eyes: EOMI.  No discharge. Cardiovascular: No JVD. Respiratory: Normal effort.  +Rebecca. GI: Non-distended. Musc: No edema or tenderness in extremities. Neurological:  He isalert. Extremely HOH.  Motor:  RUE: Shoulder abduction 4/5, elbow flexion/extension 4/5, handgrip 4/5, stable Right lower extremity: Hip flexion, knee extension 3+-4-/5, ankle dorsiflexion 4-/5, stable Skin: Right neck with ecchymosis and hard indurated area under chin, improving.  Psychiatric: Appears to have normal behavior and affect.   Assessment/Plan: 1. 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  Physiatrist is providing close team supervision and 24 hour management of active medical problems listed below.  Physiatrist and rehab team continue to assess barriers to discharge/monitor patient progress toward functional and medical goals  Care Tool:  Bathing    Body parts  bathed by patient: Left arm, Chest, Abdomen, Front perineal area   Body parts bathed by helper: Right arm, Left arm, Chest, Abdomen, Front perineal area, Right lower leg, Left upper leg, Right upper leg, Buttocks, Left lower leg, Face     Bathing assist Assist Level: Maximal Assistance - Patient 24 - 49%     Upper Body Dressing/Undressing Upper body dressing Upper body dressing/undressing activity did not occur (including orthotics): N/A What is the patient wearing?: Pull over shirt    Upper body assist Assist Level: Moderate Assistance - Patient 50 - 74%    Lower Body Dressing/Undressing Lower body dressing    Lower body dressing activity did not occur: N/A What is the patient wearing?: Incontinence brief, Pants     Lower body assist Assist for lower body dressing: Total Assistance - Patient < 25%     Toileting Toileting Toileting Activity did not occur Landscape architect and hygiene only): N/A (no void or bm)  Toileting assist Assist for toileting: Maximal Assistance - Patient 25 - 49%     Transfers Chair/bed transfer  Transfers assist  Chair/bed transfer activity did not occur: Safety/medical concerns  Chair/bed transfer assist level: Moderate Assistance - Patient 50 - 74%     Locomotion Ambulation   Ambulation assist   Ambulation activity did not occur: Safety/medical concerns  Assist level: Maximal Assistance - Patient 25 - 49% Assistive device: Other (comment)(rail in hallway) Max distance: 15'   Walk 10 feet activity   Assist  Walk 10 feet activity did not  occur: Safety/medical concerns  Assist level: Maximal Assistance - Patient 25 - 49% Assistive device: Other (comment)(rail in hallway)   Walk 50 feet activity   Assist Walk 50 feet with 2 turns activity did not occur: Safety/medical concerns         Walk 150 feet activity   Assist Walk 150 feet activity did not occur: Safety/medical concerns         Walk 10 feet on uneven  surface  activity   Assist Walk 10 feet on uneven surfaces activity did not occur: Safety/medical concerns         Wheelchair     Assist Will patient use wheelchair at discharge?: Yes Type of Wheelchair: Manual    Wheelchair assist level: Supervision/Verbal cueing Max wheelchair distance: 20'    Wheelchair 50 feet with 2 turns activity    Assist        Assist Level: Dependent - Patient 0%   Wheelchair 150 feet activity     Assist     Assist Level: Dependent - Patient 0%      Medical Problem List and Plan: 1.Functional and cognitive 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  Cont CIR PT, OT< SLP remains confused unable to use call bell appropriateley CT head performed on 4/20 showing slight improvement.  Please see PA note-discussed with neurology, plan for repeat CT in 1 week (4/27) to determine plans for anticoagulation. 2. Antithrombotics: -PE/LLEDVT/anticoagulation:None due to [place].  PE resolved on most recent CTA. -antiplatelet therapy: N/A 3. Pain Management:tylenol prn. 4. Mood:LCSW to follow for evaluation and support. antipsychotic agents: N/A.   Ativan tid PTA, increased to 0.75 TID on 4/21  Prozac. 5. Neuropsych: This patientis not fullycapable of making decisions onhisown behalf. 6. Skin/Wound Care:Monitor right neck hematoma for resolution. 7. Fluids/Electrolytes/Nutrition:Monitor I/O.  8.ABLA: Continue to monitor H/H/Platelets with serial checks. Monitor for signs of bleeding.   Hemoglobin 11.9 on 4/24  Continue to monitor 9. Right forearm wound/ recent cellulitis:Completed course of Keflex on 4/16. Added Aqacell to wound as per derm records. 10.CKD stage III: Avoid nephrotoxic medications. Monitor with serial checks.   Creatinine 1.49 on 4/24  Encourage fluids  Continue to monitor 11. H/o A fib: Monitor HR bid. Continue metoprolol bid--off Lovenox.  12. H/o Depression: On Prozac  daily with ativan tid.  68. H/o Lung cancer: Had PE on Eliquis. No antiplatelets or anticoagulation for now.   4/18 CXR reviewed, suggesting improvement  CTA on 4/18- PE resolved, progression of right hilum abnormality, chronic radiation scarring  Continue steroids for now, will consider decreasing next week  Added Mucinex dm to help with cough/clearance  -encourage IS/FV  -pulmonary toilet 14. Labile blood pressure  Controlled on 4/24 15.  Steroid-induced hyperglycemia  Off steroids  16. Leukocytosis: Likely steroid induced  WBCs 12.9 on 4/24  Afebrile  UA negative, urine culture not collected 17.  Hyponatremia  Sodium 135 on 4/24  LOS: 12 days A FACE TO [place] E Nephtali Docken 07/28/2018, 7:06 AM

## 2018-07-29 ENCOUNTER — Inpatient Hospital Stay (HOSPITAL_COMMUNITY): Payer: Medicare HMO | Admitting: Physical Therapy

## 2018-07-29 ENCOUNTER — Inpatient Hospital Stay (HOSPITAL_COMMUNITY): Payer: Medicare HMO

## 2018-07-29 NOTE — Progress Notes (Signed)
[place] PHYSICAL MEDICINE & REHABILITATION PROGRESS NOTE  Subjective/Complaints:  "Doc when will I get better?"  ROS: Limited due to [facility], but appears to deny CP, SOB, N/V/D.  Objective: Vital Signs: Blood pressure 110/65, pulse 70, temperature 97.7 F (36.5 C), resp. rate 16, height 5\' 5"  (1.651 m), weight 76.6 kg, SpO2 98 %. No results found. Recent Labs    07/27/18 0553  WBC 12.9*  HGB 11.9*  HCT 35.6*  PLT 179   Recent Labs    07/27/18 0553  NA 135  K 4.3  CL 106  CO2 18*  GLUCOSE 108*  BUN 38*  CREATININE 1.49*  CALCIUM 8.9    Physical Exam: BP 110/65 (BP Location: Left Arm)   Pulse 70   Temp 97.7 F (36.5 C)   Resp 16   Ht 5\' 5"  (1.651 m)   Wt 76.6 kg   SpO2 98%   BMI 28.10 kg/m  Constitutional: No distress . Vital signs reviewed. HENT: Normocephalic.  Atraumatic. Eyes: EOMI.  No discharge. Cardiovascular: No JVD. Respiratory: Normal effort.  +[place]. GI: Non-distended. Musc: No edema or tenderness in extremities. Neurological:  He isalert. Extremely HOH.  Motor:  RUE: Shoulder abduction 4/5, elbow flexion/extension 4/5, handgrip 4/5, stable Right lower extremity: Hip flexion, knee extension 3+-4-/5, ankle dorsiflexion 4-/5, stable Skin: Right neck with ecchymosis and hard indurated area under chin, improving.  Psychiatric: Appears to have normal behavior and affect.   Assessment/Plan: 1. 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  Physiatrist is providing close team supervision and 24 hour management of active medical problems listed below.  Physiatrist and rehab team continue to assess barriers to discharge/monitor patient progress toward functional and medical goals  Care Tool:  Bathing    Body parts bathed by patient: Left arm, Chest, Abdomen, Front perineal area   Body parts bathed by helper: Right arm, Left arm,  Chest, Abdomen, Front perineal area, Right lower leg, Left upper leg, Right upper leg, Buttocks, Left lower leg, Face     Bathing assist Assist Level: Maximal Assistance - Patient 24 - 49%     Upper Body Dressing/Undressing Upper body dressing Upper body dressing/undressing activity did not occur (including orthotics): N/A What is the patient wearing?: Pull over shirt    Upper body assist Assist Level: Moderate Assistance - Patient 50 - 74%    Lower Body Dressing/Undressing Lower body dressing    Lower body dressing activity did not occur: N/A What is the patient wearing?: Incontinence brief, Pants     Lower body assist Assist for lower body dressing: Total Assistance - Patient < 25%     Toileting Toileting Toileting Activity did not occur Landscape architect and hygiene only): N/A (no void or bm)  Toileting assist Assist for toileting: Maximal Assistance - Patient 25 - 49%     Transfers Chair/bed transfer  Transfers assist  Chair/bed transfer activity did not occur: Safety/medical concerns  Chair/bed transfer assist level: Moderate Assistance - Patient 50 - 74%     Locomotion Ambulation   Ambulation assist   Ambulation activity did not occur: Safety/medical concerns  Assist level: Maximal Assistance - Patient 25 - 49% Assistive device: Other (comment)(rail in hallway) Max distance: 15'   Walk 10 feet activity   Assist  Walk 10 feet activity did not occur: Safety/medical concerns  Assist level: Maximal Assistance - Patient 25 - 49% Assistive device: Other (comment)(rail in hallway)  Walk 50 feet activity   Assist Walk 50 feet with 2 turns activity did not occur: Safety/medical concerns         Walk 150 feet activity   Assist Walk 150 feet activity did not occur: Safety/medical concerns         Walk 10 feet on uneven surface  activity   Assist Walk 10 feet on uneven surfaces activity did not occur: Safety/medical concerns          Wheelchair     Assist Will patient use wheelchair at discharge?: Yes Type of Wheelchair: Manual    Wheelchair assist level: Supervision/Verbal cueing Max wheelchair distance: 20'    Wheelchair 50 feet with 2 turns activity    Assist        Assist Level: Dependent - Patient 0%   Wheelchair 150 feet activity     Assist     Assist Level: Dependent - Patient 0%      Medical Problem List and Plan: 1.Functional and cognitive 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  Cont CIR PT, OT< SLP remains confused unable to use call bell appropriateley CT head performed on 4/20 showing slight improvement.  Please see PA note-discussed with neurology, plan for repeat CT in 1 week (4/27) to determine plans for anticoagulation. 2. Antithrombotics: -PE/LLEDVT/anticoagulation:None due to [place].  PE resolved on most recent CTA. -antiplatelet therapy: N/A 3. Pain Management:tylenol prn. 4. Mood:LCSW to follow for evaluation and support. antipsychotic agents: N/A.   Ativan tid PTA, increased to 0.75 TID on 4/21  Prozac. 5. Neuropsych: This patientis not fullycapable of making decisions onhisown behalf. 6. Skin/Wound Care:Monitor right neck hematoma for resolution. 7. Fluids/Electrolytes/Nutrition:Monitor I/O.  8.ABLA: Continue to monitor H/H/Platelets with serial checks. Monitor for signs of bleeding.   Hemoglobin 11.9 on 4/24  Continue to monitor 9. Right forearm wound/ recent cellulitis:Completed course of Keflex on 4/16. Added Aqacell to wound as per derm records. 10.CKD stage III: Avoid nephrotoxic medications. Monitor with serial checks.   Creatinine 1.49 on 4/24  Encourage fluids  Continue to monitor 11. H/o A fib: Monitor HR bid. Continue metoprolol bid--off Lovenox.  12. H/o Depression: On Prozac daily with ativan tid.  39. H/o Lung cancer: Had PE on Eliquis. No antiplatelets or anticoagulation for now.   4/18 CXR  reviewed, suggesting improvement  CTA on 4/18- PE resolved, progression of right hilum abnormality, chronic radiation scarring  Off  steroids  Added Mucinex dm to help with cough/clearance  -encourage IS/FV  -pulmonary toilet 14. Labile blood pressure- now controlled   Vitals:   07/28/18 1933 07/29/18 0336  BP: 128/70 110/65  Pulse: 70 70  Resp: 18 16  Temp: 98 F (36.7 C) 97.7 F (36.5 C)  SpO2: 97% 98%   15.  Steroid-induced hyperglycemia  Off steroids  16. Leukocytosis: Likely steroid induced should improve off steroids  WBCs 12.9 on 4/24 recheck in am  Afebrile  UA negative, urine culture not collected 17.  Hyponatremia  Sodium 135 on 4/24  LOS: 13 days A FACE TO [place] E Beauregard Jarrells 07/29/2018, 6:29 AM

## 2018-07-29 NOTE — Progress Notes (Signed)
Physical Therapy Session Note  Patient Details  Name: Evan [name] MRN: [id number] Date of Birth: [birth year]  Today's Date: 07/29/2018 PT Individual Time: 7078-6754 PT Individual Time Calculation (min): 76 min   Short Term Goals: Week 2:  PT Short Term Goal 1 (Week 2): Pt will initiate w/c mobility  PT Short Term Goal 2 (Week 2): Pt will maintain dynamic sitting balance w/ min assist PT Short Term Goal 3 (Week 2): Pt will attend to RLE during functional mobility w/ min cues 50% of the time  Skilled Therapeutic Interventions/Progress Updates:  Pt received in bed hooked up to continuous pulse ox. Pt with periods of labored breathing & SpO2 would decrease to 88% and periods where pt appears to be apneic but SpO2 increases to 97%. RN made aware & reports this is pt's baseline and due to contributing factors (anxiety, hx of lung CA & tissue scarring) & clears pt to participate in therapy with use of supplemental oxygen PRN. Pt transfers to sitting EOB with min assist and maintains static sitting balance with supervision. Pt transfers bed>w/c on L via stand pivot with mod assist. Transported pt to gym via w/c dependent assist for time management. Pt transfers w/c<>mat table with pt always transferring to L with mod assist overall with decreased safety awareness. Pt engages in sit<>stand from EOM with min assist with very poor ability to come to full upright standing with pt maintaining B hips & knees flexed & forward trunk flexion. Pt engaged in reaching outside of BOS to move horseshoes with task focusing on weight shifting, standing balance, and R NMR with therapist blocking at R knee to prevent buckling and RLE moving & therapist also providing manual facilitation for anterior pelvic shift. Pt transfers sit>supine with mod assist, supine>sit with total assist on mat table. While supine on mat table therapist positioned BLE and attempted to instruct pt in bridging but pt unable to follow multimodal  cuing/commands and instructions and only able to perform 2 reps. Pt reports incontinent void and is assisted back to w/c in same manner as noted above. Back in room pt transfers to standing at sink with some improvement in upright posture with use of mirror for visual feedback. Pt was not incontinent of bladder but did have incontinent BM. Required +2 for cleaning pt with therapist providing assistance for standing balance at sink while RN performed peri hygiene; pt requires dependent assist for peri hygiene and donning clean brief. Pt utilized cybex kinetron from w/c level with task focusing on BLE strengthening & R NMR. At end of session pt left sitting in TIS w/c with chair alarm donned & call bell in lap.   Pt with great difficulty following commands throughout session but unsure if this is due to [facility] or poor cognition. Utilized written communication to assist with communicating with pt 2/2 HOH.    SpO2 spot checked throughout session with pt on room air & SpO2 always >90%.   Therapy Documentation Precautions:  Precautions Precautions: Fall Precaution Comments: bruising along R side of neck Restrictions Weight Bearing Restrictions: No   Pain: No c/o or behaviors demonstrating pain.    Therapy/Group: Individual Therapy  Waunita Schooner 07/29/2018, 10:37 AM

## 2018-07-29 NOTE — Progress Notes (Signed)
Physical Therapy Session Note  Patient Details  Name: Evan [name] MRN: [id number] Date of Birth: [birth year]  Today's Date: 07/29/2018 PT Missed Time: 56 Minutes Missed Time Reason: Patient unwilling to participate  Short Term Goals: Week 2:  PT Short Term Goal 1 (Week 2): Pt will initiate w/c mobility  PT Short Term Goal 2 (Week 2): Pt will maintain dynamic sitting balance w/ min assist PT Short Term Goal 3 (Week 2): Pt will attend to RLE during functional mobility w/ min cues 50% of the time  Skilled Therapeutic Interventions/Progress Updates:    Pt supine in bed upon PT arrival. Pt asking for the doctor and repeats to the therapist "I am dying." Therapist attempted to calm pt and provide emotional support, RN also present to talk to pt. Therapist and RN Caryl Pina) encouraging pt to participate in therapy session, therapist provided education on importance of OOB activity. Pt refuses therapy and reports "No, I can't get up. I don't want to get up. I need to talk to the doctor." Pt left supine in bed, pt missed 60 minutes of skilled therapy tx this session.   Therapy Documentation Precautions:  Precautions Precautions: Fall Precaution Comments: bruising along R side of neck Restrictions Weight Bearing Restrictions: No   Therapy/Group: Individual Therapy  Netta Corrigan, PT, DPT 07/29/2018, 7:58 AM

## 2018-07-29 NOTE — Plan of Care (Signed)
  Problem: RH BLADDER ELIMINATION Goal: RH STG MANAGE BLADDER WITH ASSISTANCE Description STG Manage Bladder With Mod Assistance   Outcome: Progressing Goal: RH STG MANAGE BLADDER WITH EQUIPMENT WITH ASSISTANCE Description STG Manage Bladder With Equipment With  Mod Assistance   Outcome: Progressing   Problem: RH SKIN INTEGRITY Goal: RH STG SKIN FREE OF INFECTION/BREAKDOWN Description Monitor skin for breakdown and skin tears during admission. No breakdown with mod assist   Outcome: Progressing Goal: RH STG MAINTAIN SKIN INTEGRITY WITH ASSISTANCE Description STG Maintain Skin Integrity With Mod Assistance.   Outcome: Progressing Goal: RH STG ABLE TO PERFORM INCISION/WOUND CARE W/ASSISTANCE Description STG Able To Perform Incision/Wound Care With Mod Assistance.   Outcome: Progressing   Problem: RH PAIN MANAGEMENT Goal: RH STG PAIN MANAGED AT OR BELOW PT'S PAIN GOAL Description Patient will be pain free or pain less than 3 during admission  Outcome: Progressing   Problem: Consults Goal: RH STROKE PATIENT EDUCATION Description See Patient Education module for education specifics  Outcome: Not Progressing Goal: Nutrition Consult-if indicated Outcome: Not Progressing Goal: Diabetes Guidelines if Diabetic/Glucose > 140 Description If diabetic or lab glucose is > 140 mg/dl - Initiate Diabetes/Hyperglycemia Guidelines & Document Interventions  Outcome: Not Progressing   Problem: RH BOWEL ELIMINATION Goal: RH STG MANAGE BOWEL WITH ASSISTANCE Description STG Manage Bowel with Mod Assistance.   Outcome: Not Progressing Goal: RH STG MANAGE BOWEL W/MEDICATION W/ASSISTANCE Description STG Manage Bowel with Medication with Assistance. mod  Outcome: Not Progressing   Problem: RH SAFETY Goal: RH STG ADHERE TO SAFETY PRECAUTIONS W/ASSISTANCE/DEVICE Description STG Adhere to Safety Precautions With Mod Assistance/Device.   Outcome: Not Progressing   Problem: RH  COGNITION-NURSING Goal: RH STG USES MEMORY AIDS/STRATEGIES W/ASSIST TO PROBLEM SOLVE Description STG Uses Memory Aids/Strategies With Mod Assistance to Problem Solve.   Outcome: Not Progressing Goal: RH STG ANTICIPATES NEEDS/CALLS FOR ASSIST W/ASSIST/CUES Description STG Anticipates Needs/Calls for Assist With  Mod Assistance/Cues.   Outcome: Not Progressing

## 2018-07-30 ENCOUNTER — Inpatient Hospital Stay (HOSPITAL_COMMUNITY): Payer: Medicare HMO | Admitting: Physical Therapy

## 2018-07-30 ENCOUNTER — Inpatient Hospital Stay (HOSPITAL_COMMUNITY): Payer: Medicare HMO | Admitting: Occupational Therapy

## 2018-07-30 ENCOUNTER — Inpatient Hospital Stay (HOSPITAL_COMMUNITY): Payer: Medicare HMO | Admitting: Speech Pathology

## 2018-07-30 ENCOUNTER — Inpatient Hospital Stay (HOSPITAL_COMMUNITY): Payer: Medicare HMO

## 2018-07-30 LAB — CBC WITH DIFFERENTIAL/PLATELET
Abs Immature Granulocytes: 0.7 10*3/uL — ABNORMAL HIGH (ref 0.00–0.07)
Basophils Absolute: 0 10*3/uL (ref 0.0–0.1)
Basophils Relative: 0 %
Eosinophils Absolute: 0.7 10*3/uL — ABNORMAL HIGH (ref 0.0–0.5)
Eosinophils Relative: 6 %
HCT: 36.4 % — ABNORMAL LOW (ref 39.0–52.0)
Hemoglobin: 12 g/dL — ABNORMAL LOW (ref 13.0–17.0)
Lymphocytes Relative: 12 %
Lymphs Abs: 1.3 10*3/uL (ref 0.7–4.0)
MCH: 31.1 pg (ref 26.0–34.0)
MCHC: 33 g/dL (ref 30.0–36.0)
MCV: 94.3 fL (ref 80.0–100.0)
Metamyelocytes Relative: 1 %
Monocytes Absolute: 0.7 10*3/uL (ref 0.1–1.0)
Monocytes Relative: 6 %
Myelocytes: 3 %
Neutro Abs: 7.8 10*3/uL — ABNORMAL HIGH (ref 1.7–7.7)
Neutrophils Relative %: 70 %
Platelets: 242 10*3/uL (ref 150–400)
Promyelocytes Relative: 2 %
RBC: 3.86 MIL/uL — ABNORMAL LOW (ref 4.22–5.81)
RDW: 13.5 % (ref 11.5–15.5)
WBC: 11.2 10*3/uL — ABNORMAL HIGH (ref 4.0–10.5)
nRBC: 0 % (ref 0.0–0.2)
nRBC: 1 /100 WBC — ABNORMAL HIGH

## 2018-07-30 LAB — BASIC METABOLIC PANEL
Anion gap: 7 (ref 5–15)
BUN: 23 mg/dL (ref 8–23)
CO2: 21 mmol/L — ABNORMAL LOW (ref 22–32)
Calcium: 8.6 mg/dL — ABNORMAL LOW (ref 8.9–10.3)
Chloride: 106 mmol/L (ref 98–111)
Creatinine, Ser: 1.41 mg/dL — ABNORMAL HIGH (ref 0.61–1.24)
GFR calc Af Amer: 51 mL/min — ABNORMAL LOW (ref >60)
GFR calc non Af Amer: 44 mL/min — ABNORMAL LOW (ref >60)
Glucose, Bld: 111 mg/dL — ABNORMAL HIGH (ref 70–99)
Potassium: 4.4 mmol/L (ref 3.5–5.1)
Sodium: 134 mmol/L — ABNORMAL LOW (ref 135–145)

## 2018-07-30 LAB — PATHOLOGIST SMEAR REVIEW

## 2018-07-30 NOTE — Plan of Care (Signed)
  Problem: Consults Goal: RH STROKE PATIENT EDUCATION Description See Patient Education module for education specifics  Outcome: Progressing Goal: Nutrition Consult-if indicated Outcome: Progressing Goal: Diabetes Guidelines if Diabetic/Glucose > 140 Description If diabetic or lab glucose is > 140 mg/dl - Initiate Diabetes/Hyperglycemia Guidelines & Document Interventions  Outcome: Progressing   Problem: RH BOWEL ELIMINATION Goal: RH STG MANAGE BOWEL WITH ASSISTANCE Description STG Manage Bowel with Mod Assistance.   Outcome: Progressing Goal: RH STG MANAGE BOWEL W/MEDICATION W/ASSISTANCE Description STG Manage Bowel with Medication with Assistance. mod  Outcome: Progressing   Problem: RH BLADDER ELIMINATION Goal: RH STG MANAGE BLADDER WITH ASSISTANCE Description STG Manage Bladder With Mod Assistance   Outcome: Progressing Goal: RH STG MANAGE BLADDER WITH EQUIPMENT WITH ASSISTANCE Description STG Manage Bladder With Equipment With  Mod Assistance   Outcome: Progressing   Problem: RH SKIN INTEGRITY Goal: RH STG SKIN FREE OF INFECTION/BREAKDOWN Description Monitor skin for breakdown and skin tears during admission. No breakdown with mod assist   Outcome: Progressing Goal: RH STG MAINTAIN SKIN INTEGRITY WITH ASSISTANCE Description STG Maintain Skin Integrity With Mod Assistance.   Outcome: Progressing Goal: RH STG ABLE TO PERFORM INCISION/WOUND CARE W/ASSISTANCE Description STG Able To Perform Incision/Wound Care With Mod Assistance.   Outcome: Progressing   Problem: RH SAFETY Goal: RH STG ADHERE TO SAFETY PRECAUTIONS W/ASSISTANCE/DEVICE Description STG Adhere to Safety Precautions With Mod Assistance/Device.   Outcome: Progressing   Problem: RH COGNITION-NURSING Goal: RH STG USES MEMORY AIDS/STRATEGIES W/ASSIST TO PROBLEM SOLVE Description STG Uses Memory Aids/Strategies With Mod Assistance to Problem Solve.   Outcome: Progressing Goal: RH STG ANTICIPATES  NEEDS/CALLS FOR ASSIST W/ASSIST/CUES Description STG Anticipates Needs/Calls for Assist With  Mod Assistance/Cues.   Outcome: Progressing   Problem: RH PAIN MANAGEMENT Goal: RH STG PAIN MANAGED AT OR BELOW PT'S PAIN GOAL Description Patient will be pain free or pain less than 3 during admission  Outcome: Progressing

## 2018-07-30 NOTE — Progress Notes (Signed)
Speech Language Pathology Daily Session Note  Patient Details  Name: DMETRIUS AMBS MRN: [id number] Date of Birth: [birth year]  Today's Date: 07/30/2018 SLP Individual Time: 1420-1435 SLP Individual Time Calculation (min): 15 min  Short Term Goals: Week 2: SLP Short Term Goal 1 (Week 2): Pt will demonstrate orientation x4 with min verbal cues. SLP Short Term Goal 2 (Week 2): Pt will complete basic problem solving tasks with min assist given verbal cues. SLP Short Term Goal 3 (Week 2): Patient will consume current diet with minimal overt s/s of aspiration and overall Min A verbal cues for use of swallowing compensatory strategies.   Skilled Therapeutic Interventions: Pt seen for shortened session targeting goals for cognition. Pt had just finished a phone call with his daughter, and appeared tearful. Pt continues to exhibit significant hearing impairment, and reports his daughter was unable to find his hearing aid at home. Pt had difficulty with orientation information today. He was unaware of time, place, or situation despite environmental cues posted at the foot of his bed. SLP provided education regarding orienting information, and where it is posted in his room. SLP confirmed that pt was able to read the information from his bed. Pt was left with nurse tech taking vitals. Pt in bed with alarm on, all needs within reach. Continue current plan of care.  Pain Pain Assessment Pain Scale: 0-10 Pain Score: 0-No pain  Therapy/Group: Individual Therapy   Lillian Ballester B. Quentin Ore, [facility], CCC-SLP Speech Language Pathologist   [place]ang, [place] 07/30/2018, 3:16 PM

## 2018-07-30 NOTE — Progress Notes (Addendum)
[place] PHYSICAL MEDICINE & REHABILITATION PROGRESS NOTE  Subjective/Complaints: Patient seen laying in bed this morning.  He slept fairly well overnight per sleep chart.  He had a CT scan overnight.  Discussed with nursing regarding drop in O2 sats, which was corrected with 1 L supplemental oxygen.  ROS: Limited due to [facility], but appears to deny CP, SOB, N/V/D.  Objective: Vital Signs: Blood pressure 115/77, pulse 66, temperature 98.5 F (36.9 C), temperature source Oral, resp. rate (!) 22, height 5\' 5"  (1.651 m), weight 74.6 kg, SpO2 97 %. Ct Head Wo Contrast  Result Date: 07/30/2018 CLINICAL DATA:  85 year old male with history of stroke. Follow-up study. EXAM: CT HEAD WITHOUT CONTRAST TECHNIQUE: Contiguous axial images were obtained from the base of the skull through the vertex without intravenous contrast. COMPARISON:  Head CT 07/23/2018. FINDINGS: Brain: Previously noted high attenuation focus centered in the left side of the thalamus has decreased in size, currently measuring 10 x 18 x 16 mm (axial image 17 of series 3 and coronal image 44 of series 5), with persistent surrounding decreased attenuation in the adjacent parenchyma, compatible with some adjacent edema. This results in only minimal local mass effect. No new sites of hemorrhage are noted. No hydrocephalus. No extra-axial fluid collections. No midline shift or signs of herniation. Mild cerebral atrophy. Patchy and confluent areas of decreased attenuation are noted throughout the deep and periventricular white matter of the cerebral hemispheres bilaterally, compatible with chronic microvascular ischemic disease. Vascular: No hyperdense vessel or unexpected calcification. Skull: Normal. Negative for fracture or focal lesion. Sinuses/Orbits: Mucoperiosteal thickening and volume loss in the left maxillary sinus related to chronic sinusitis. Status post left maxillary antrectomy. Status post right mastoidectomy. Other: None.  IMPRESSION: 1. Slight decreased size of hemorrhagic infarct in the left thalamus with small amount of surrounding edema and mild local mass effect, which also appear to be slightly improved. No new acute findings. 2. Mild cerebral atrophy with chronic microvascular ischemic changes in the cerebral white matter. 3. Sequela of chronic left maxillary sinusitis. Electronically Signed   By: Vinnie Langton M.D.   On: 07/30/2018 08:08   Recent Labs    07/30/18 0559  WBC 11.2*  HGB 12.0*  HCT 36.4*  PLT 242   Recent Labs    07/30/18 0559  NA 134*  K 4.4  CL 106  CO2 21*  GLUCOSE 111*  BUN 23  CREATININE 1.41*  CALCIUM 8.6*    Physical Exam: BP 115/77 (BP Location: Left Arm)   Pulse 66   Temp 98.5 F (36.9 C) (Oral)   Resp (!) 22   Ht 5\' 5"  (1.651 m)   Wt 74.6 kg   SpO2 97%   BMI 27.37 kg/m  Constitutional: No distress . Vital signs reviewed. HENT: Normocephalic.  Atraumatic. Eyes: EOMI.  No discharge. Cardiovascular: No JVD. Respiratory: Normal effort.  + [place]. GI: Non-distended. Musc: No edema or tenderness in extremities. Neurological:  He isalert. Extremely HOH.  Motor:  RUE: Shoulder abduction 4/5, elbow flexion/extension 4/5, handgrip 4/5, improving Right lower extremity: Hip flexion, knee extension 4--4/5, ankle dorsiflexion 4-/4/5 Skin: Right neck with ecchymosis and hard indurated area under chin, improving.  Psychiatric: Appears to have normal behavior and affect.   Assessment/Plan: 1. 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  Physiatrist is providing close team supervision and 24 hour management of active medical problems listed below.  Physiatrist and  rehab team continue to assess barriers to discharge/monitor patient progress toward functional and medical goals  Care Tool:  Bathing    Body parts bathed by patient: Left arm, Chest,  Abdomen, Front perineal area   Body parts bathed by helper: Right arm, Left arm, Chest, Abdomen, Front perineal area, Right lower leg, Left upper leg, Right upper leg, Buttocks, Left lower leg, Face     Bathing assist Assist Level: Maximal Assistance - Patient 24 - 49%     Upper Body Dressing/Undressing Upper body dressing Upper body dressing/undressing activity did not occur (including orthotics): N/A What is the patient wearing?: Pull over shirt    Upper body assist Assist Level: Moderate Assistance - Patient 50 - 74%    Lower Body Dressing/Undressing Lower body dressing    Lower body dressing activity did not occur: N/A What is the patient wearing?: Incontinence brief, Pants     Lower body assist Assist for lower body dressing: Total Assistance - Patient < 25%     Toileting Toileting Toileting Activity did not occur Landscape architect and hygiene only): N/A (no void or bm)  Toileting assist Assist for toileting: Maximal Assistance - Patient 25 - 49%     Transfers Chair/bed transfer  Transfers assist  Chair/bed transfer activity did not occur: Safety/medical concerns  Chair/bed transfer assist level: Moderate Assistance - Patient 50 - 74%     Locomotion Ambulation   Ambulation assist   Ambulation activity did not occur: Safety/medical concerns  Assist level: Maximal Assistance - Patient 25 - 49% Assistive device: Other (comment)(rail in hallway) Max distance: 15'   Walk 10 feet activity   Assist  Walk 10 feet activity did not occur: Safety/medical concerns  Assist level: Maximal Assistance - Patient 25 - 49% Assistive device: Other (comment)(rail in hallway)   Walk 50 feet activity   Assist Walk 50 feet with 2 turns activity did not occur: Safety/medical concerns         Walk 150 feet activity   Assist Walk 150 feet activity did not occur: Safety/medical concerns         Walk 10 feet on uneven surface  activity   Assist Walk 10  feet on uneven surfaces activity did not occur: Safety/medical concerns         Wheelchair     Assist Will patient use wheelchair at discharge?: Yes Type of Wheelchair: Manual    Wheelchair assist level: Supervision/Verbal cueing Max wheelchair distance: 20'    Wheelchair 50 feet with 2 turns activity    Assist        Assist Level: Dependent - Patient 0%   Wheelchair 150 feet activity     Assist     Assist Level: Dependent - Patient 0%      Medical Problem List and Plan: 1.Functional and cognitive 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  Cont CIR  CT head performed on 4/20 showing slight improvement.  Please see PA note-discussed with neurology, repeat CT personally reviewed- continued improvement in hemorrhagic infarct, will discuss with neurology. 2. Antithrombotics: -PE/LLEDVT/anticoagulation:None due to [place].  PE resolved on most recent CTA. -antiplatelet therapy: N/A 3. Pain Management:tylenol prn. 4. Mood:LCSW to follow for evaluation and support. antipsychotic agents: N/A.   Ativan tid PTA, increased to 0.75 TID on 4/21  Prozac. 5. Neuropsych: This patientis not fullycapable of making decisions onhisown behalf. 6. Skin/Wound Care:Monitor right neck hematoma for resolution. 7. Fluids/Electrolytes/Nutrition:Monitor I/O.  8.ABLA: Continue to monitor H/H/Platelets with serial checks. Monitor for signs of bleeding.  Hemoglobin 12.0 on 4/27  Continue to monitor 9. Right forearm wound/ recent cellulitis:Completed course of Keflex on 4/16. Added Aqacell to wound as per derm records. 10.CKD stage III: Avoid nephrotoxic medications. Monitor with serial checks.   Creatinine 1.41 on 4/27  Encourage fluids  Continue to monitor 11. H/o A fib: Monitor HR bid. Continue metoprolol bid--off Lovenox.  12. H/o Depression: On Prozac daily with ativan tid.  77. H/o Lung cancer: Had PE on Eliquis. No  antiplatelets or anticoagulation for now.   4/18 CXR reviewed, suggesting improvement  CTA on 4/18- PE resolved, progression of right hilum abnormality, chronic radiation scarring  Off  steroids  Added Mucinex dm to help with cough/clearance  -encourage IS/FV  -pulmonary toilet 14. Labile blood pressure-    Vitals:   07/30/18 0104 07/30/18 0400  BP:  115/77  Pulse: 64 66  Resp:  (!) 22  Temp:  98.5 F (36.9 C)  SpO2: 97% 97%   Controlled on 4/27 15.  Steroid-induced hyperglycemia  Off steroids now, remains elevated 16. Leukocytosis: Likely steroid induced   WBCs 11.2 on 4/27, improving  Afebrile  UA negative, urine culture not collected 17.  Hyponatremia  Sodium 134 on 4/27  LOS: 14 days A FACE TO FACE EVALUATION WAS PERFORMED  Roselle Norton Lorie Phenix 07/30/2018, 9:12 AM

## 2018-07-30 NOTE — Progress Notes (Signed)
Physical Therapy Session Note  Patient Details  Name: Evan [name] MRN: [id number] Date of Birth: 1928/01/03  Today's Date: 07/30/2018 PT Individual Time: 1150-1220 PT Individual Time Calculation (min): 30 min   Short Term Goals: Week 2:  PT Short Term Goal 1 (Week 2): Pt will initiate w/c mobility  PT Short Term Goal 2 (Week 2): Pt will maintain dynamic sitting balance w/ min assist PT Short Term Goal 3 (Week 2): Pt will attend to RLE during functional mobility w/ min cues 50% of the time  Skilled Therapeutic Interventions/Progress Updates:  Pt presented in bed requesting to use urinal. PTA assisted in placing urinal however noted present urinary incontinence. Pt agreeable to change brief. Pt performed supine to sit with use of bed features and pt initiating movement of RLE to EOB. Performed transfer with modA and heavy use of bed features. Pt performed STS in Stedy with minA with pt achieving erect posture for brief moment then leaning to R. Pt able to correct improved posture with mod cues. PTA changed brief total A and pt performed multiple STS in Stedy with PTA providing max multimodal cues for reaching with RUE. Pt refusing to transfer to TIS even for short period of time and requesting to return to bed. Pt performed sit to supine modA and was able to boost self up to [facility] with PTA blocking RLE. Per OT request changed TIS cushion and left with NT present to set up for lunch     Therapy Documentation Precautions:  Precautions Precautions: Fall Precaution Comments: bruising along R side of neck Restrictions Weight Bearing Restrictions: No General: PT Amount of Missed Time (min): 15 Minutes PT Missed Treatment Reason: Patient fatigue;Patient unwilling to participate    Therapy/Group: Individual Therapy  Zonya Gudger  Ronell Duffus, PTA  07/30/2018, 12:31 PM

## 2018-07-30 NOTE — Progress Notes (Signed)
Occupational Therapy Session Note  Patient Details  Name: Evan [name] MRN: [id number] Date of Birth: [birth year]  Today's Date: 07/30/2018 OT Individual Time: 8307-4600 OT Individual Time Calculation (min): 60 min   Short Term Goals: Week 2:  OT Short Term Goal 1 (Week 2): Pt will complete toilet transfer with max A without Stedy OT Short Term Goal 2 (Week 2): Pt will completed 1 step of LB dressing task OT Short Term Goal 3 (Week 2): Pt will complete sit<>stand at the sink without Stedy with max A in preparation for BADL tasks  Skilled Therapeutic Interventions/Progress Updates:    Pt greeted semi-reclined in bed agreeable to OT. Pt on 1 L of O2 throughout session with SpO2 maintained above 95%. Pt's hearing aid was not in and OT unable to hear feedback. OT changed battery and wax guard tip, then was able to hear feedback indicating hearing aid was working. OT put hearing aid in R ear and pt able to help position it properly. Pt came to sitting EOB with increased time but overall Min A. Stedy used for sit<>stand with hand over hand A to place R UE on pull up bar. Pt came to standing with min A. Verbal cues to maintain midline and decrease pushing to the R. Pt transferred to wc in stedy with improved posture throughout transfer. Sit<>stand in stedy to wash buttocks and change brief. Once standing, pt stated he was having a BM. Pt stood for 5 mins while having BM in brief, then OT assist for peri-care and brief change. Total A to thread pants 2/2 fatigue. Sit<>stand again with min A, but a little more lateral lean to the R 2/2 fatigue. UB bathing/dressing completed in TIS wc at the sink with hand over hand to integrate R UE into bathing tasks. Pt left up in TIS wc with w/c tilted for pressure relief and fatigue. Safety alarm belt on and needs met.  Therapy Documentation Precautions:  Precautions Precautions: Fall Precaution Comments: bruising along R side of neck Restrictions Weight Bearing  Restrictions: No Pain:  none/denies pain   Therapy/Group: Individual Therapy  Valma Cava 07/30/2018, 9:43 AM

## 2018-07-31 ENCOUNTER — Inpatient Hospital Stay (HOSPITAL_COMMUNITY): Payer: Medicare HMO | Admitting: Physical Therapy

## 2018-07-31 ENCOUNTER — Inpatient Hospital Stay (HOSPITAL_COMMUNITY): Payer: Medicare HMO | Admitting: Occupational Therapy

## 2018-07-31 ENCOUNTER — Inpatient Hospital Stay (HOSPITAL_COMMUNITY): Payer: Medicare HMO | Admitting: Speech Pathology

## 2018-07-31 MED ORDER — ASPIRIN EC 81 MG PO TBEC
81.0000 mg | DELAYED_RELEASE_TABLET | Freq: Every day | ORAL | Status: DC
  Administered 2018-07-31 – 2018-08-15 (×16): 81 mg via ORAL
  Filled 2018-07-31 (×16): qty 1

## 2018-07-31 NOTE — Progress Notes (Signed)
Pt awakens throughout the night hollering with episodes of confusion. O2 sat ranges 96-99% 1N/C.

## 2018-07-31 NOTE — Progress Notes (Signed)
Discussed patient with Dr. Ardeen Jourdain recommends starting low dose ASA and having patient follow up in office after discharge for repeat CT head as well as other recommendations.

## 2018-07-31 NOTE — Plan of Care (Signed)
  Problem: RH BOWEL ELIMINATION Goal: RH STG MANAGE BOWEL WITH ASSISTANCE Description STG Manage Bowel with Mod Assistance.   Outcome: Progressing  Assess skin after each incont episode  Problem: RH BLADDER ELIMINATION Goal: RH STG MANAGE BLADDER WITH ASSISTANCE Description STG Manage Bladder With Mod Assistance   Outcome: Progressing  Assess skin after each incont episode Problem: RH SKIN INTEGRITY Goal: RH STG SKIN FREE OF INFECTION/BREAKDOWN Description Monitor skin for breakdown and skin tears during admission. No breakdown with mod assist   Outcome: Progressing  Administer barrier after incont episodes.  Problem: RH SAFETY Goal: RH STG ADHERE TO SAFETY PRECAUTIONS W/ASSISTANCE/DEVICE Description STG Adhere to Safety Precautions With Mod Assistance/Device.   Outcome: Progressing  Continuous bed alarm on

## 2018-07-31 NOTE — Progress Notes (Signed)
Speech Language Pathology Daily Session Note  Patient Details  Name: Evan [name] MRN: [id number] Date of Birth: 1927-08-19  Today's Date: 07/31/2018 SLP Individual Time: 6063-0160 SLP Individual Time Calculation (min): 40 min   Skilled Therapeutic Interventions: Skilled treatment session focused on cognitive goals. SLP facilitated session by providing extra time and Mod A verbal cues for problem solving during a 4-step picture sequencing task. SLP also facilitated session by providing Max A verbal cues for utilization of external aids for orientation place, month and year. Patient left upright in wheelchair with alarm on and all needs within reach. Continue with current plan of care.      Pain No/Denies Pain   Therapy/Group: Individual Therapy  Paidyn Mcferran 07/31/2018, 3:33 PM

## 2018-07-31 NOTE — Progress Notes (Signed)
Occupational Therapy Weekly Progress Note  Patient Details  Name: Evan [name] MRN: [id number] Date of Birth: [birth year]  Beginning of progress report period: July 17, 2018 End of progress report period: July 31, 2018  Today's Date: 07/31/2018 OT Individual Time: 7672-0947 OT Individual Time Calculation (min): 75 min    Patient has met 3 of 3 short term goals.  Pt has made steady progress towards OT goals this week. Pt has tolerated OOB activities more and is working on functional transfers without the use of a Stedy. Pt has demonstrated some improved awareness and functional use of R side within bathing/dressing tasks. Continue current POC.  Patient continues to demonstrate the following deficits: muscle weakness, decreased cardiorespiratoy endurance and decreased oxygen support, impaired timing and sequencing, unbalanced muscle activation, motor apraxia, ataxia, decreased coordination and decreased motor planning, decreased midline orientation, decreased attention to right and decreased motor planning, decreased initiation, decreased attention, decreased awareness, decreased problem solving, decreased safety awareness, decreased memory and delayed processing and decreased sitting balance, decreased standing balance, decreased postural control, hemiplegia and decreased balance strategies and therefore will continue to benefit from skilled OT intervention to enhance overall performance with BADL and Reduce care partner burden.  Patient progressing toward long term goals..  Continue plan of care.  OT Short Term Goals Week 2:  OT Short Term Goal 1 (Week 2): Pt will complete toilet transfer with max A without Stedy OT Short Term Goal 1 - Progress (Week 2): Met OT Short Term Goal 2 (Week 2): Pt will completed 1 step of LB dressing task OT Short Term Goal 2 - Progress (Week 2): Met OT Short Term Goal 3 (Week 2): Pt will complete sit<>stand at the sink without Stedy with max A in preparation for  BADL tasks OT Short Term Goal 3 - Progress (Week 2): Met Week 3:  OT Short Term Goal 1 (Week 3): Pt will complete toilet transfer with mod A OT Short Term Goal 2 (Week 3): Pt will maintain standing balance for 1 minute with no more than mod A OT Short Term Goal 3 (Week 3): Pt will use R UE for 50% of bathing tasks with mod verbal cues  Skilled Therapeutic Interventions/Progress Updates:    Pt greeted semi-reclined in bed on 1L of O2 with SpO2 at 99%.  Pt taken off O2 and able to maintain SpO2 at 95% throughout session. Pt came to sitting EOB with increased time and min A. Min A to achieve sitting balance. Pt then completed stand-pivot transfer from bed to TIS wc on strong L side with max A and R knee block as R knee has tendency to extend and move out from under pt with weight bearing. UB bathing completed wc at the sink with OT providing hand over hand A to integrate R UE. Worked on sit<>stands at the sink without Stedy. Pt able to power up into standing with min A, but then needed min progressing to max A for standing balance 2/2 R lateral lean/push with fatigue. Pt was incontinent of BM and bladder in standing, but tolerated standing while OT provided peri-care and changed brief. Max verbal cues to use mirror feedback and decrease pushing in standing. Blocked practice for UB dressing today with multiple trials to thread R UE through shirt sleeve first. Worked on R NMR with B UE task of plastic tube chest press with pt able to maintain R grasp on tube today. Pt the able to maintain R grasp on tube and touch therapists  hand in 2 quadrants. Pt retruned to room and handoff to PT.  Therapy Documentation Precautions:  Precautions Precautions: Fall Precaution Comments: bruising along R side of neck Restrictions Weight Bearing Restrictions: No Pain: Pain Assessment Pain Scale: 0-10 Pain Score: 0-No pain   Therapy/Group: Individual Therapy  Valma Cava 07/31/2018, 10:39 AM

## 2018-07-31 NOTE — Progress Notes (Addendum)
Physical Therapy Weekly Progress Note  Patient Details  Name: Evan [name] MRN: [id number] Date of Birth: [birth year]  Beginning of progress report period: July 24, 2018 End of progress report period: July 31, 2018  Today's Date: 07/31/2018 PT Individual Time: 1050-1115 AND 1430-1440 PT Individual Time Calculation (min): 25 min AND 10 min  Patient has met 2 of 3 short term goals. Pt continues to make slow progress towards LTGs, however has demonstrated improved endurance over last week and is able to more consistently participate meaningfully in therapy sessions. He is consistently mod-max assist for stand pivot transfers in either direction, and does not need to use the stedy except for with nursing staff. He can self-propel a manual w/c w/ supervision-min assist via L hemi technique for short bouts, however is primarily in [place] chair to work on OOB and upright tolerance. Anticipate he will not need TIS at d/c. Gait continues to require max assist at rail in hallway 2/2 R scissoring and poor midline orientation. He continues to require max verbal, visual, and tactile cues for static balance, midline orientation, and to attend to R side of body during functional tasks.   Patient continues to demonstrate the following deficits muscle weakness, decreased cardiorespiratoy endurance and decreased oxygen support, impaired timing and sequencing, unbalanced muscle activation, motor apraxia, decreased coordination and decreased motor planning, decreased midline orientation and decreased attention to right, decreased attention, decreased awareness, decreased problem solving, decreased safety awareness, decreased memory and delayed processing and decreased sitting balance, decreased standing balance, decreased postural control, hemiplegia and decreased balance strategies and therefore will continue to benefit from skilled PT intervention to increase functional independence with mobility.  Patient not  progressing toward long term goals.  See goal revision..  Plan of care revisions: W/c distance goal downgraded, anticipate pt will only be self-propelling household distances at d/c.  Stair goal discontinued, anticipate pt will not be negotiating stairs at d/c and has ramp already in place for home access. Household gait goal also discontinued, not recommending that pt ambulate w/ family assist upon d/c. All other min assist, w/c level goals remain appropriate at this time.   PT Short Term Goals Week 2:  PT Short Term Goal 1 (Week 2): Pt will initiate w/c mobility  PT Short Term Goal 1 - Progress (Week 2): Met PT Short Term Goal 2 (Week 2): Pt will maintain dynamic sitting balance w/ min assist PT Short Term Goal 2 - Progress (Week 2): Met PT Short Term Goal 3 (Week 2): Pt will attend to RLE during functional mobility w/ min cues 50% of the time PT Short Term Goal 3 - Progress (Week 2): Progressing toward goal Week 3:  PT Short Term Goal 1 (Week 3): Pt will transfer bed<>chair w/ mod assist consistently PT Short Term Goal 2 (Week 3): Pt will ambulate 15' w/ mod assist using LRAD PT Short Term Goal 3 (Week 3): Pt will maintain static standing balance w/ min assist  PT Short Term Goal 4 (Week 3): Pt will self-propel w/c 50' w/ min assist   Skilled Therapeutic Interventions/Progress Updates:   Session 1:  Pt in [place] chair and agreeable to therapy, no c/o pain. Total assist w/c transport to/from therapy gym. Stand pivot transfer to mat on L side w/ mod assist. Worked on dynamic sitting balance and L weight shifting while tossing horseshoes. Reached w/ LUE anteriorly and laterally to facilitate increased L WB and decrease pushing tendencies. Performed 2x10 reps of horseshoes, occasional tactile and verbal  cues to correct LOB. Max assist stand pivot back to chair on R side. Returned to room and ended session in TIS, all needs in reach.   Session 2:  Pt in supine and refusing to participate 2/2 fatigue  despite max encouragement. Pt did request assistance w/ voiding in urinal, required max assist and ultimately unable to void despite increased time. Missed 25 min of skilled PT 2/2 refusal/fatigue.   Therapy Documentation Precautions:  Precautions Precautions: Fall Precaution Comments: bruising along R side of neck Restrictions Weight Bearing Restrictions: No Pain: Pain Assessment Pain Scale: 0-10 Pain Score: 0-No pain  Therapy/Group: Individual Therapy  Evan [name] Evan [name] 07/31/2018, 11:25 AM

## 2018-07-31 NOTE — Progress Notes (Signed)
[facility] PHYSICAL MEDICINE & REHABILITATION PROGRESS NOTE  Subjective/Complaints: Patient seen lying in bed this morning.  He is easily arousable.  He slept fairly overnight per sleep chart.  ROS: Limited due to [facility], but appears to deny CP, SOB, N/V/D.  Objective: Vital Signs: Blood pressure (!) 132/55, pulse 66, temperature (!) 97.4 F (36.3 C), resp. rate 20, height 5\' 5"  (1.651 m), weight 74.9 kg, SpO2 97 %. Ct Head Wo Contrast  Result Date: 07/30/2018 CLINICAL DATA:  85 year old male with history of stroke. Follow-up study. EXAM: CT HEAD WITHOUT CONTRAST TECHNIQUE: Contiguous axial images were obtained from the base of the skull through the vertex without intravenous contrast. COMPARISON:  Head CT 07/23/2018. FINDINGS: Brain: Previously noted high attenuation focus centered in the left side of the thalamus has decreased in size, currently measuring 10 x 18 x 16 mm (axial image 17 of series 3 and coronal image 44 of series 5), with persistent surrounding decreased attenuation in the adjacent parenchyma, compatible with some adjacent edema. This results in only minimal local mass effect. No new sites of hemorrhage are noted. No hydrocephalus. No extra-axial fluid collections. No midline shift or signs of herniation. Mild cerebral atrophy. Patchy and confluent areas of decreased attenuation are noted throughout the deep and periventricular white matter of the cerebral hemispheres bilaterally, compatible with chronic microvascular ischemic disease. Vascular: No hyperdense vessel or unexpected calcification. Skull: Normal. Negative for fracture or focal lesion. Sinuses/Orbits: Mucoperiosteal thickening and volume loss in the left maxillary sinus related to chronic sinusitis. Status post left maxillary antrectomy. Status post right mastoidectomy. Other: None. IMPRESSION: 1. Slight decreased size of hemorrhagic infarct in the left thalamus with small amount of surrounding edema and mild local mass  effect, which also appear to be slightly improved. No new acute findings. 2. Mild cerebral atrophy with chronic microvascular ischemic changes in the cerebral white matter. 3. [place]ela of chronic left maxillary sinusitis. Electronically Signed   By: Vinnie Langton M.D.   On: 07/30/2018 08:08   Recent Labs    07/30/18 0559  WBC 11.2*  HGB 12.0*  HCT 36.4*  PLT 242   Recent Labs    07/30/18 0559  NA 134*  K 4.4  CL 106  CO2 21*  GLUCOSE 111*  BUN 23  CREATININE 1.41*  CALCIUM 8.6*    Physical Exam: BP (!) 132/55 (BP Location: Left Arm)   Pulse 66   Temp (!) 97.4 F (36.3 C)   Resp 20   Ht 5\' 5"  (1.651 m)   Wt 74.9 kg   SpO2 97%   BMI 27.48 kg/m  Constitutional: No distress . Vital signs reviewed. HENT: Normocephalic.  Atraumatic. Eyes: EOMI.  No discharge. Cardiovascular: No JVD. Respiratory: Normal effort.  GI: Non-distended. Musc: No edema or tenderness in extremities. Neurological:  He alert. Extremely HOH.  Motor:  RUE: Shoulder abduction 4/5, elbow flexion/extension 4/5, handgrip 4/5, stable Right lower extremity: Hip flexion, knee extension 4--4/5, ankle dorsiflexion 4-/4/5, stable Skin: Right neck with ecchymosis and hard indurated area under chin, improving.  Psychiatric: Appears to have normal behavior and affect.   Assessment/Plan: 1. 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  Physiatrist is providing close team supervision and 24 hour management of active medical problems listed below.  Physiatrist and rehab team continue to assess barriers to discharge/monitor patient progress toward functional and medical goals  Care Tool:  Bathing  Body parts bathed by patient: Left arm, Chest, Abdomen, Front perineal area   Body parts bathed by helper: Right arm, Left arm, Chest, Abdomen, Front perineal area, Right lower leg, Left upper  leg, Right upper leg, Buttocks, Left lower leg, Face     Bathing assist Assist Level: Maximal Assistance - Patient 24 - 49%     Upper Body Dressing/Undressing Upper body dressing Upper body dressing/undressing activity did not occur (including orthotics): N/A What is the patient wearing?: Pull over shirt    Upper body assist Assist Level: Moderate Assistance - Patient 50 - 74%    Lower Body Dressing/Undressing Lower body dressing    Lower body dressing activity did not occur: N/A What is the patient wearing?: Incontinence brief, Pants     Lower body assist Assist for lower body dressing: Total Assistance - Patient < 25%     Toileting Toileting Toileting Activity did not occur Landscape architect and hygiene only): N/A (no void or bm)  Toileting assist Assist for toileting: Maximal Assistance - Patient 25 - 49%     Transfers Chair/bed transfer  Transfers assist  Chair/bed transfer activity did not occur: Safety/medical concerns  Chair/bed transfer assist level: Moderate Assistance - Patient 50 - 74%     Locomotion Ambulation   Ambulation assist   Ambulation activity did not occur: Safety/medical concerns  Assist level: Maximal Assistance - Patient 25 - 49% Assistive device: Other (comment)(rail in hallway) Max distance: 15'   Walk 10 feet activity   Assist  Walk 10 feet activity did not occur: Safety/medical concerns  Assist level: Maximal Assistance - Patient 25 - 49% Assistive device: Other (comment)(rail in hallway)   Walk 50 feet activity   Assist Walk 50 feet with 2 turns activity did not occur: Safety/medical concerns         Walk 150 feet activity   Assist Walk 150 feet activity did not occur: Safety/medical concerns         Walk 10 feet on uneven surface  activity   Assist Walk 10 feet on uneven surfaces activity did not occur: Safety/medical concerns         Wheelchair     Assist Will patient use wheelchair at  discharge?: Yes Type of Wheelchair: Manual    Wheelchair assist level: Supervision/Verbal cueing Max wheelchair distance: 20'    Wheelchair 50 feet with 2 turns activity    Assist        Assist Level: Dependent - Patient 0%   Wheelchair 150 feet activity     Assist     Assist Level: Dependent - Patient 0%      Medical Problem List and Plan: 1.Functional and cognitive 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  Cont CIR  CT head performed on 4/20 showing slight improvement.  Please see PA note-discussed with neurology, repeat CT personally reviewed- continued improvement in hemorrhagic infarct, continue to monitor. 2. Antithrombotics: -PE/LLEDVT/anticoagulation:None due to [place].  PE resolved on most recent CTA. -antiplatelet therapy: N/A 3. Pain Management:tylenol prn. 4. Mood:LCSW to follow for evaluation and support. antipsychotic agents: N/A.   Ativan tid PTA, increased to 0.75 TID on 4/21  Prozac. 5. Neuropsych: This patientis not fullycapable of making decisions onhisown behalf. 6. Skin/Wound Care:Monitor right neck hematoma for resolution. 7. Fluids/Electrolytes/Nutrition:Monitor I/O.  8.ABLA: Continue to monitor H/H/Platelets with serial checks. Monitor for signs of bleeding.   Hemoglobin 12.0 on 4/27  Continue to monitor 9. Right forearm wound/ recent cellulitis:Completed course of Keflex on 4/16. Added Aqacell to  wound as per derm records. 10.CKD stage III: Avoid nephrotoxic medications. Monitor with serial checks.   Creatinine 1.41 on 4/27  Encourage fluids  Continue to monitor 11. H/o A fib: Monitor HR bid. Continue metoprolol bid--off Lovenox.  12. H/o Depression: On Prozac daily with ativan tid.  62. H/o Lung cancer: Had PE on Eliquis. No antiplatelets or anticoagulation for now.   4/18 CXR reviewed, suggesting improvement  CTA on 4/18- PE resolved, progression of right hilum abnormality,  chronic radiation scarring  Off  steroids  Added Mucinex dm to help with cough/clearance  -encourage IS/FV  -pulmonary toilet 14. Labile blood pressure-    Vitals:   07/30/18 2029 07/31/18 0509  BP: 126/79 (!) 132/55  Pulse: 66 66  Resp: (!) 22 20  Temp:  (!) 97.4 F (36.3 C)  SpO2:  61%   Diastolic BP remains labile on 4/20 15.  Steroid-induced hyperglycemia  Off steroids now, remains elevated 16. Leukocytosis: Likely steroid induced   WBCs 11.2 on 4/27, improving, will plan to repeat labs later this week  Afebrile  UA negative, urine culture not collected 17.  Hyponatremia  Sodium 134 on 4/27, will plan to repeat labs later this week  LOS: 15 days A FACE TO FACE EVALUATION WAS PERFORMED  Shaw Dobek Lorie Phenix 07/31/2018, 8:18 AM

## 2018-08-01 ENCOUNTER — Inpatient Hospital Stay (HOSPITAL_COMMUNITY): Payer: Medicare HMO | Admitting: Physical Therapy

## 2018-08-01 ENCOUNTER — Inpatient Hospital Stay (HOSPITAL_COMMUNITY): Payer: Medicare HMO

## 2018-08-01 ENCOUNTER — Inpatient Hospital Stay (HOSPITAL_COMMUNITY): Payer: Medicare HMO | Admitting: Speech Pathology

## 2018-08-01 NOTE — Progress Notes (Signed)
Physical Therapy Session Note  Patient Details  Name: Evan [name] MRN: [id number] Date of Birth: [birth year]  Today's Date: 08/01/2018 PT Individual Time: 1500-1526 PT Individual Time Calculation (min): 26 min   Short Term Goals: Week 3:  PT Short Term Goal 1 (Week 3): Pt will transfer bed<>chair w/ mod assist consistently PT Short Term Goal 2 (Week 3): Pt will ambulate 15' w/ mod assist using LRAD PT Short Term Goal 3 (Week 3): Pt will maintain static standing balance w/ min assist  PT Short Term Goal 4 (Week 3): Pt will self-propel w/c 50' w/ min assist   Skilled Therapeutic Interventions/Progress Updates:   Pt in supine and agreeable to therapy, denies pain. Supine to sit w/ mod assist. Static sitting balance at EOB w/ supervision during rest breaks. Blocked practice of sit<>stands to/from RW w/ mod assist to boost into standing and mod-max assist to maintain static standing balance. Worked on achieving midline control in static stance, max verbal, tactile, and visual cues for midline. Sit<>stand x3 reps. Sat at EOB for 5 minutes after, pt repeatedly asking to lay down, needed max encouragement to stay up and work on OOB tolerance, min assist for static sitting balance as he fatigued. Sips of water from cup w/ moderate coughing afterwards - made RN aware. Doffed and donned new gown w/ total assist. Sit to supine w/ mod assist. Ended session in propped supine, all needs in reach.   Therapy Documentation Precautions:  Precautions Precautions: Fall Precaution Comments: bruising along R side of neck Restrictions Weight Bearing Restrictions: No Vital Signs: Therapy Vitals Temp: 99.8 F (37.7 C) Temp Source: Oral Pulse Rate: 79 Resp: (!) 24 BP: 119/68 Patient Position (if appropriate): Lying Oxygen Therapy SpO2: 98 % O2 Device: Nasal Cannula O2 Flow Rate (L/min): 1 L/min  Therapy/Group: Individual Therapy  Edison Wollschlager Clent Demark 08/01/2018, 3:29 PM

## 2018-08-01 NOTE — Progress Notes (Signed)
Occupational Therapy Session Note  Patient Details  Name: Evan [name] MRN: [id number] Date of Birth: [birth year]  Today's Date: 08/01/2018 OT Individual Time: 1002-1045 OT Individual Time Calculation (min): 43 min    Short Term Goals: Week 1:  OT Short Term Goal 1 (Week 1): Pt will complete toilet transfer with max A  OT Short Term Goal 1 - Progress (Week 1): Met OT Short Term Goal 2 (Week 1): Pt will complete sit<>stand with max A in preparation for BADL tasks OT Short Term Goal 2 - Progress (Week 1): Met OT Short Term Goal 3 (Week 1): Pt will tolerate sitting EOB for 4 mins to increase endurance for BADL tasks OT Short Term Goal 3 - Progress (Week 1): Met  Skilled Therapeutic Interventions/Progress Updates:    1;1. Pt received in bed in chair position asking to be put back to bed. Pt requires increased time to orient using external aides. Pt completes supine>sitting EOB with MIN A overall to scoot to EOB. With min VC for able to find midline and maintain~3 min EOB prior to transitioning to chair with MAX A for R knee block d/t  R knee extension when standing. Pt completes grooming with MAX HOH A to incorporate RUE into activity and VC for R attention. Pt completes dynavision locating stimuli in lower hemisphere with 7.5 sec reaction time and pt spontaneously uses RUE to locate 4 lights with mod HOH A d/t ataxia and weakness. Exited session with tp seated in bed, exit alarm on and all needs met.  Therapy Documentation Precautions:  Precautions Precautions: Fall Precaution Comments: bruising along R side of neck Restrictions Weight Bearing Restrictions: No General:   Vital Signs:   Pain:   ADL: ADL Eating: Minimal assistance Grooming: Maximal assistance Upper Body Bathing: Dependent Lower Body Bathing: Dependent Upper Body Dressing: Maximal assistance Lower Body Dressing: Dependent Toileting: Dependent Toilet Transfer: Unable to assess Tub/Shower Transfer: Unable to  assess Vision   Perception    Praxis   Exercises:   Other Treatments:     Therapy/Group: Individual Therapy  Tonny Branch 08/01/2018, 10:44 AM

## 2018-08-01 NOTE — Progress Notes (Addendum)
Physical Therapy Session Note  Patient Details  Name: [name] MRN: [id number] Date of Birth: May 24, 1927  Today's Date: 08/01/2018 PT Individual Time: 0900-0930 PT Individual Time Calculation (min): 30 min   Short Term Goals:  Week 3:  PT Short Term Goal 1 (Week 3): Pt will transfer bed<>chair w/ mod assist consistently PT Short Term Goal 2 (Week 3): Pt will ambulate 15' w/ mod assist using LRAD PT Short Term Goal 3 (Week 3): Pt will maintain static standing balance w/ min assist  PT Short Term Goal 4 (Week 3): Pt will self-propel w/c 50' w/ min assist   Skilled Therapeutic Interventions/Progress Updates:   Pt resting in bed.  Pt denied pain.  Supine>< sit using bed features, min assist.  neuromuscular re-education via RUE wt bearing and multimodal cues for midline orientation in sitting.  Therapeutic activity: in sitting with feet supported, reaching activity out of BOS to L with L hand, to retrieve grooming items, per request.  Pt accurate 7/8 items.  Pt also used green breathing exercise tool in sitting, with min cues to avoid R lean.  Sit> stand with min assist, usuing bil UE support once standing, on back of recliner chair, x 2 for 10 wt shifts L><R.   Pt left resting in bed, positioned in upright, LUE supported by pillow, and needs at hand.  Bed alarm set.  Pt on 2L O2 via [place] , O2 sats 90% or greater during session.     Therapy Documentation Precautions:  Precautions Precautions: Fall Precaution Comments: bruising along R side of neck Restrictions Weight Bearing Restrictions: No      Therapy/Group: Individual Therapy  Jamelah Sitzer 08/01/2018, 9:35 AM

## 2018-08-01 NOTE — Progress Notes (Signed)
Speech Language Pathology Weekly Progress and Session Note  Patient Details  Name: Evan [name] MRN: [id number] Date of Birth: 1928/01/15  Beginning of progress report period: July 24, 2018 End of progress report period: August 01, 2018  Today's Date: 08/01/2018 SLP Individual Time: 0715-0755 SLP Individual Time Calculation (min): 40 min  Short Term Goals: Week 2: SLP Short Term Goal 1 (Week 2): Pt will demonstrate orientation x4 with min verbal cues. SLP Short Term Goal 1 - Progress (Week 2): Not met SLP Short Term Goal 2 (Week 2): Pt will complete basic problem solving tasks with min assist given verbal cues. SLP Short Term Goal 2 - Progress (Week 2): Not met SLP Short Term Goal 3 (Week 2): Patient will consume current diet with minimal overt s/s of aspiration and overall Min A verbal cues for use of swallowing compensatory strategies.  SLP Short Term Goal 3 - Progress (Week 2): Not met    New Short Term Goals: Week 3: SLP Short Term Goal 1 (Week 3): Pt will demonstrate orientation x4 with mod verbal cues. SLP Short Term Goal 2 (Week 3): Pt will complete basic problem solving tasks with min assist given verbal cues. SLP Short Term Goal 3 (Week 3): Patient will consume current diet with minimal overt s/s of aspiration and overall Min A verbal cues for use of swallowing compensatory strategies.   Weekly Progress Updates: Patient has made minimal gains this week and has not met any STGs this reporting period. Currently, patient is consuming Dys. 2 textures with thin liquids with minimal overt s/s of aspiration but continues to require overall Mod A verbal cues for use of a slow rate and small bites/sips to minimize oral residue and maximize overall safety. Patient continues to be intermittent confused and requires overall Mod-Max A verbal and visual cues for orientation to time, place and situation and for functional problem solving with basic and familiar tasks. Patient and family eduction  ongoing. Patient would benefit from continued skilled SLP intervention to maximize his cognitive and swallowing function prior to discharge in order to reduce caregiver burden.      Intensity: Minumum of 1-2 x/day, 30 to 90 minutes Frequency: 3 to 5 out of 7 days Duration/Length of Stay: 5/13 Treatment/Interventions: Cognitive remediation/compensation;Multimodal communication approach;Functional tasks;Therapeutic Activities;Patient/family education;Environmental controls;Internal/external aids;Cueing hierarchy;Dysphagia/aspiration precaution training   Daily Session  Skilled Therapeutic Interventions: Upon arrival, patient is yelling "help me" into the hallway. Patient requested to get out of bed but was also disoriented to time requiring total A for orientation to time of day. Patient repositioned in bed to maximize arousal and comfort but was not transferred to wheelchair due to poor endurance and needing to conserve energy for rest of therapy schedule. Patient consumed breakfast meal of Dys. 2 textures with thin liquids (appeared to present more like Dys. 3 textures) with Mod A verbal and visual cues needed for use of small bites/sips and a slow rate of self-feeding. Patient with intermittent overt s/s of aspiration due to mixed consistencies from requiring large, liquid washes to clear moderate amount of oral residue. Patient also required Min A verbal cues for problem solving during self-feeding.   Recommend patient continue current diet with full supervision. Patient left upright in bed with alarm on and handed off to NT. Continue with current plan of care.     Pain No/Denies Pain   Therapy/Group: Individual Therapy  Karrington Studnicka 08/01/2018, 6:31 AM

## 2018-08-01 NOTE — Progress Notes (Signed)
[place] PHYSICAL MEDICINE & REHABILITATION PROGRESS NOTE  Subjective/Complaints: Patient seen sitting up in bed eating breakfast this morning, working with SLP.  He states he did not sleep well overnight, however this is what he says every day.  No sleep chart present.  Patient would like to know when he can go home.  ROS: Limited due to [facility], but appears to deny CP, SOB, N/V/D.  Objective: Vital Signs: Blood pressure 125/63, pulse 68, temperature 98 F (36.7 C), resp. rate 20, height 5\' 5"  (1.651 m), weight 76.6 kg, SpO2 100 %. No results found. Recent Labs    07/30/18 0559  WBC 11.2*  HGB 12.0*  HCT 36.4*  PLT 242   Recent Labs    07/30/18 0559  NA 134*  K 4.4  CL 106  CO2 21*  GLUCOSE 111*  BUN 23  CREATININE 1.41*  CALCIUM 8.6*    Physical Exam: BP 125/63 (BP Location: Left Arm)   Pulse 68   Temp 98 F (36.7 C)   Resp 20   Ht 5\' 5"  (1.651 m)   Wt 76.6 kg   SpO2 100%   BMI 28.10 kg/m  Constitutional: No distress . Vital signs reviewed. HENT: Normocephalic.  Atraumatic. Eyes: EOMI.  No discharge. Cardiovascular: No JVD. Respiratory: Normal effort.  GI: Non-distended. Musc: No edema or tenderness in extremities. Neurological:  He alert. Extremely HOH.  Motor:  RUE: Shoulder abduction 4/5, elbow flexion/extension 4/5, handgrip 4/5, unchanged Right lower extremity: Hip flexion, knee extension 4--4/5, ankle dorsiflexion 4-/4/5, unchanged Skin: Right neck with ecchymosis and hard indurated area under chin, improving.  Psychiatric: Appears to have normal behavior and affect.   Assessment/Plan: 1. 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  Physiatrist is providing close team supervision and 24 hour management of active medical problems listed below.  Physiatrist and rehab team continue to assess barriers to discharge/monitor patient  progress toward functional and medical goals  Care Tool:  Bathing    Body parts bathed by patient: Left arm, Chest, Abdomen, Front perineal area   Body parts bathed by helper: Right arm, Left arm, Chest, Abdomen, Front perineal area, Right lower leg, Left upper leg, Right upper leg, Buttocks, Left lower leg, Face     Bathing assist Assist Level: Maximal Assistance - Patient 24 - 49%     Upper Body Dressing/Undressing Upper body dressing Upper body dressing/undressing activity did not occur (including orthotics): N/A What is the patient wearing?: Pull over shirt    Upper body assist Assist Level: Moderate Assistance - Patient 50 - 74%    Lower Body Dressing/Undressing Lower body dressing    Lower body dressing activity did not occur: N/A What is the patient wearing?: Incontinence brief, Pants     Lower body assist Assist for lower body dressing: Total Assistance - Patient < 25%     Toileting Toileting Toileting Activity did not occur Landscape architect and hygiene only): N/A (no void or bm)  Toileting assist Assist for toileting: Maximal Assistance - Patient 25 - 49%     Transfers Chair/bed transfer  Transfers assist  Chair/bed transfer activity did not occur: Safety/medical concerns  Chair/bed transfer assist level: Maximal Assistance - Patient 25 - 49%     Locomotion Ambulation   Ambulation assist   Ambulation activity did not occur: Safety/medical concerns  Assist level: Maximal Assistance - Patient 25 - 49% Assistive device: Other (comment)(rail in hallway)  Max distance: 15'   Walk 10 feet activity   Assist  Walk 10 feet activity did not occur: Safety/medical concerns  Assist level: Maximal Assistance - Patient 25 - 49% Assistive device: Other (comment)(rail in hallway)   Walk 50 feet activity   Assist Walk 50 feet with 2 turns activity did not occur: Safety/medical concerns         Walk 150 feet activity   Assist Walk 150 feet  activity did not occur: Safety/medical concerns         Walk 10 feet on uneven surface  activity   Assist Walk 10 feet on uneven surfaces activity did not occur: Safety/medical concerns         Wheelchair     Assist Will patient use wheelchair at discharge?: Yes Type of Wheelchair: Manual    Wheelchair assist level: Supervision/Verbal cueing Max wheelchair distance: 20'    Wheelchair 50 feet with 2 turns activity    Assist        Assist Level: Dependent - Patient 0%   Wheelchair 150 feet activity     Assist     Assist Level: Dependent - Patient 0%      Medical Problem List and Plan: 1.Functional and cognitive 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  Cont CIR  CT head performed on 4/20 showing slight improvement.  Please see PA note-discussed with neurology, repeat CT personally reviewed- continued improvement in hemorrhagic infarct, after PA discussion with neurology, ASA initiated.  Team conference today to discuss current and goals and coordination of care, home and environmental barriers, and discharge planning with nursing, case manager, and therapies.  2. Antithrombotics: -PE/LLEDVT/anticoagulation:None due to [place].  PE resolved on most recent CTA. -antiplatelet therapy: ASA 3. Pain Management:tylenol prn. 4. Mood:LCSW to follow for evaluation and support. antipsychotic agents: N/A.   Ativan tid PTA, increased to 0.75 TID on 4/21  Prozac. 5. Neuropsych: This patientis not fullycapable of making decisions onhisown behalf. 6. Skin/Wound Care:Monitor right neck hematoma for resolution. 7. Fluids/Electrolytes/Nutrition:Monitor I/O.  8.ABLA: Continue to monitor H/H/Platelets with serial checks. Monitor for signs of bleeding.   Hemoglobin 12.0 on 4/27  Continue to monitor 9. Right forearm wound/ recent cellulitis:Completed course of Keflex on 4/16. Added Aqacell to wound as per derm  records. 10.CKD stage III: Avoid nephrotoxic medications. Monitor with serial checks.   Creatinine 1.41 on 4/27, labs ordered for tomorrow  Encourage fluids  Continue to monitor 11. H/o A fib: Monitor HR bid. Continue metoprolol bid--off Lovenox.  12. H/o Depression: On Prozac daily with ativan tid.  34. H/o Lung cancer: Had PE on Eliquis. No antiplatelets or anticoagulation for now.   4/18 CXR reviewed, suggesting improvement  CTA on 4/18- PE resolved, progression of right hilum abnormality, chronic radiation scarring  Off  steroids  Added Mucinex dm to help with cough/clearance  -encourage IS/FV  -pulmonary toilet 14. Labile blood pressure-    Vitals:   07/31/18 1959 08/01/18 0453  BP: 117/68 125/63  Pulse: 67 68  Resp:  20  Temp:  98 F (36.7 C)  SpO2:  100%   Relatively controlled on 4/29 15.  Steroid-induced hyperglycemia  Off steroids now, remains elevated 16. Leukocytosis: Likely steroid induced   WBCs 11.2 on 4/27, improving, labs ordered for tomorrow  Afebrile  UA negative, urine culture not collected 17.  Hyponatremia  Sodium 134 on 4/27, labs ordered for tomorrow  LOS: 16 days A FACE TO FACE EVALUATION WAS PERFORMED  Bijon Mineer Lorie Phenix 08/01/2018, 8:40 AM

## 2018-08-01 NOTE — Progress Notes (Signed)
Physical Therapy Session Note  Patient Details  Name: Evan [name] MRN: [id number] Date of Birth: 1927-05-02  Today's Date: 08/01/2018 PT Individual Time: 1331-1359 PT Individual Time Calculation (min): 28 min   Short Term Goals: Week 3:  PT Short Term Goal 1 (Week 3): Pt will transfer bed<>chair w/ mod assist consistently PT Short Term Goal 2 (Week 3): Pt will ambulate 15' w/ mod assist using LRAD PT Short Term Goal 3 (Week 3): Pt will maintain static standing balance w/ min assist  PT Short Term Goal 4 (Week 3): Pt will self-propel w/c 50' w/ min assist   Skilled Therapeutic Interventions/Progress Updates:    Patient received in bed, eating ice cream and stating "I'm still working on eating", able to be convinced to participate in therapy eating ice cream at EOB, declined all other activities. Able to perform bed mobility with ModA and sat EOB for approximately 20 minutes with lateral and cross midline reaches for spoon with small bite of ice cream on it. Noted patient often with posterior lean at EOB and reporting high levels of fatigue requiring MinA and Mod cues to correct. Noted that ice cream often leaked out of R side of mouth, cues provided to wipe chin; checked mouth for pocketing R side and did not see any other food in R side of mouth. Returned to supine with ModA, required assist of +2 for repositioning in bed. He was left in bed with nurse tech present and attending, this therapist asked nurse tech to also perform oral care following his meal. VSS during session.   Therapy Documentation Precautions:  Precautions Precautions: Fall Precaution Comments: bruising along R side of neck Restrictions Weight Bearing Restrictions: No Pain: Pain Assessment Pain Scale: Faces Faces Pain Scale: No hurt    Therapy/Group: Individual Therapy   Deniece Ree PT, DPT, CBIS  Supplemental Physical Therapist [facility]    Pager [phone] Acute Rehab Office  [phone]    08/01/2018, 3:51 PM

## 2018-08-02 ENCOUNTER — Inpatient Hospital Stay (HOSPITAL_COMMUNITY): Payer: Medicare HMO | Admitting: Physical Therapy

## 2018-08-02 ENCOUNTER — Inpatient Hospital Stay (HOSPITAL_COMMUNITY): Payer: Medicare HMO

## 2018-08-02 ENCOUNTER — Inpatient Hospital Stay (HOSPITAL_COMMUNITY): Payer: Medicare HMO | Admitting: Occupational Therapy

## 2018-08-02 ENCOUNTER — Inpatient Hospital Stay (HOSPITAL_COMMUNITY): Payer: Medicare HMO | Admitting: Speech Pathology

## 2018-08-02 DIAGNOSIS — J9601 Acute respiratory failure with hypoxia: Secondary | ICD-10-CM

## 2018-08-02 DIAGNOSIS — R0603 Acute respiratory distress: Secondary | ICD-10-CM

## 2018-08-02 DIAGNOSIS — R54 Age-related physical debility: Secondary | ICD-10-CM

## 2018-08-02 LAB — CBC WITH DIFFERENTIAL/PLATELET
Abs Immature Granulocytes: 0.38 10*3/uL — ABNORMAL HIGH (ref 0.00–0.07)
Basophils Absolute: 0 10*3/uL (ref 0.0–0.1)
Basophils Relative: 0 %
Eosinophils Absolute: 0 10*3/uL (ref 0.0–0.5)
Eosinophils Relative: 0 %
HCT: 36.8 % — ABNORMAL LOW (ref 39.0–52.0)
Hemoglobin: 12.2 g/dL — ABNORMAL LOW (ref 13.0–17.0)
Immature Granulocytes: 3 %
Lymphocytes Relative: 10 %
Lymphs Abs: 1.4 10*3/uL (ref 0.7–4.0)
MCH: 31.3 pg (ref 26.0–34.0)
MCHC: 33.2 g/dL (ref 30.0–36.0)
MCV: 94.4 fL (ref 80.0–100.0)
Monocytes Absolute: 1.3 10*3/uL — ABNORMAL HIGH (ref 0.1–1.0)
Monocytes Relative: 8 %
Neutro Abs: 11.9 10*3/uL — ABNORMAL HIGH (ref 1.7–7.7)
Neutrophils Relative %: 79 %
Platelets: 207 10*3/uL (ref 150–400)
RBC: 3.9 MIL/uL — ABNORMAL LOW (ref 4.22–5.81)
RDW: 13.8 % (ref 11.5–15.5)
WBC: 15 10*3/uL — ABNORMAL HIGH (ref 4.0–10.5)
nRBC: 0 % (ref 0.0–0.2)

## 2018-08-02 LAB — BASIC METABOLIC PANEL
Anion gap: 11 (ref 5–15)
BUN: 21 mg/dL (ref 8–23)
CO2: 19 mmol/L — ABNORMAL LOW (ref 22–32)
Calcium: 8.7 mg/dL — ABNORMAL LOW (ref 8.9–10.3)
Chloride: 102 mmol/L (ref 98–111)
Creatinine, Ser: 1.55 mg/dL — ABNORMAL HIGH (ref 0.61–1.24)
GFR calc Af Amer: 46 mL/min — ABNORMAL LOW (ref >60)
GFR calc non Af Amer: 39 mL/min — ABNORMAL LOW (ref >60)
Glucose, Bld: 110 mg/dL — ABNORMAL HIGH (ref 70–99)
Potassium: 4.2 mmol/L (ref 3.5–5.1)
Sodium: 132 mmol/L — ABNORMAL LOW (ref 135–145)

## 2018-08-02 LAB — BRAIN NATRIURETIC PEPTIDE: B Natriuretic Peptide: 477.9 pg/mL — ABNORMAL HIGH (ref 0.0–100.0)

## 2018-08-02 NOTE — Progress Notes (Signed)
Speech Language Pathology Daily Session Note  Patient Details  Name: Evan [name] MRN: [id number] Date of Birth: 1927/08/10  Today's Date: 08/02/2018 SLP Individual Time: 0830-0900 SLP Individual Time Calculation (min): 30 min  Short Term Goals: Week 3: SLP Short Term Goal 1 (Week 3): Pt will demonstrate orientation x4 with mod verbal cues. SLP Short Term Goal 2 (Week 3): Pt will complete basic problem solving tasks with min assist given verbal cues. SLP Short Term Goal 3 (Week 3): Patient will consume current diet with minimal overt s/s of aspiration and overall Min A verbal cues for use of swallowing compensatory strategies.   Skilled Therapeutic Interventions: Skilled treatment session focused on dysphagia and cognitive goals. Upon arrival, patient appeared to demonstrate increased work of breathing on 1L of O2 while sitting upright in bed with patient reporting "I can't breath" but O2 saturations WFL. Patient consumed a few bites of Dys. 1 textures and demonstrated consistent coughing, suspect due to difficulty coordinating breathing and swallowing with increase work of breathing. RN aware and RT called to administer a breathing treatment. Patient left upright in bed with alarm on and all needs within reach. Continue with current plan of care.       Pain No/Denies Pain   Therapy/Group: Individual Therapy  Jeyli Zwicker 08/02/2018, 9:03 AM

## 2018-08-02 NOTE — Progress Notes (Signed)
Physical Therapy Session Note  Patient Details  Name: Evan [name] MRN: [id number] Date of Birth: 02/20/1928  Today's Date: 08/02/2018 PT Individual Time: 1330-1415 PT Individual Time Calculation (min): 45 min   Short Term Goals: Week 3:  PT Short Term Goal 1 (Week 3): Pt will transfer bed<>chair w/ mod assist consistently PT Short Term Goal 2 (Week 3): Pt will ambulate 15' w/ mod assist using LRAD PT Short Term Goal 3 (Week 3): Pt will maintain static standing balance w/ min assist  PT Short Term Goal 4 (Week 3): Pt will self-propel w/c 50' w/ min assist   Skilled Therapeutic Interventions/Progress Updates:   Pt in propped supine, eating lunch w/ NT. Took over care from NT and session focused on breathing strategies while eating lunch, attending to task of eating, and endurance w/ self-feeding. Pt SOB upon entering room, audibly wheezing, and w/ pale/blue lips. O2 >96% on 1 L/min supplemental O2. Pt needed assist w/ getting food onto utensil, however pt able to bring utensil to mouth w/o assist. Verbal cues for wiping mouth as residue often spilling out of R side. Verbal cues to attend to task and frequent verbal and visual cues for slow, deep breathing and to take rests in between bites. Pt ate about 1/3 of meal, refused anything further despite encouragement. Family member called on phone. Assisted pt w/ holding phone up to ear w/ LUE, able to maintain 30 sec at a time and needed assist to bring back up to ear. Pt overall slow to process all movements, more so than usual for him when interacting with this therapist. Pt declined further activity once finished with eating lunch 2/2 fatigue/SOB. Max-total assist to reposition in propped supine for increased airflow and to decrease accessory muscle use during breathing. Ended session in supine, all needs in reach. Discussed w/ nursing staff positioning techniques to allow for optimal airflow in propped supine including chair position, elevation deg,  and use of towel roll vs pillow to allow for increased neck extension in supine. RN in agreement, sign posted in pt's room for future nursing staff. Missed 15 min of skilled PT 2/2 fatigue/SOB.  Therapy Documentation Precautions:  Precautions Precautions: Fall Precaution Comments: bruising along R side of neck Restrictions Weight Bearing Restrictions: No Vital Signs: Therapy Vitals Temp: 99 F (37.2 C) Temp Source: Oral Pulse Rate: 84 Resp: (!) 32 BP: 118/62 Patient Position (if appropriate): Lying Oxygen Therapy SpO2: 97 % O2 Device: Room Air  Therapy/Group: Individual Therapy  Aditi Rovira Clent Demark 08/02/2018, 2:24 PM

## 2018-08-02 NOTE — Consult Note (Signed)
NAME:  Evan [name], MRN:  [id number], DOB:  1927-05-07, LOS: 12 ADMISSION DATE:  07/16/2018, CONSULTATION DATE:  4/20 REFERRING MD:  Posey Pronto , CHIEF COMPLAINT:  Recurrent shortness of breath    Brief History   85 year old male patient admitted to rehab on 4/13 following thalamic intracranial hemorrhage.  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  Pulmonary asked to see on 4/30 for recurrent episodes of dyspnea  History of present illness   This is a 85 year old male patient with history as mentioned below, recently admitted to inpatient rehab following a intracranial bleed felt secondary to hypertension in setting of anticoagulation.  He was admitted to rehab on 4/13. Rehab course: 4/13 admitted.  Seen by speech therapy.  Recommended to have dysphagia 2 with thin liquids due to difficulty with mastication Also safe swallow interventions.  However no witnessed aspiration. 4/15: More short of breath, x-ray negative for acute finding. 4/16: More fatigue.  Upper airway wheezing noted.  More shortness of breath bronchodilators added, chest x-ray obtained demonstrating vascular crowding IV Lasix given, somewhat hypertensive 4/18: Anxious.  More short of breath, getting Lasix.  Steroids ordered.  Seem to improve with this.  CT of chest was obtained this was negative for pulmonary emboli.  There was chronic right lower lobe changes also progression of right hilar soft tissue mass 4/20 through 24: No complaints respiratory issues 4/22 steroids stopped 4/25 confused 4/27: Desaturated, required 1 L of oxygen. 4/30 more short of breath again more anxious.  Pulmonary asked to evaluate  Past Medical History  Adenocarcinoma of the right lower lobe status post SBRT with concern about possible associated radiation pneumonitis, previous DVT  and pulmonary embolisms (he had been on enoxaparin but this was discontinued due to recent thalamic bleed: Most recent scans negative for clot).  Rheumatoid arthritis, COPD, coronary artery disease and prior CABG Recently hospitalized from 4/11 through 4/13 after being admitted for acute intracranial hemorrhage felt secondary to coagulopathy and hypertension, went to inpatient rehab [facility]   4/13 admitted.  Seen by speech therapy.  Recommended to have dysphagia 2 with thin liquids due to difficulty with mastication Also safe swallow interventions.  However no witnessed aspiration. 4/15: More short of breath, x-ray negative for acute finding. 4/16: More fatigue.  Upper airway wheezing noted.  More shortness of breath bronchodilators added, chest x-ray obtained demonstrating vascular crowding IV Lasix given, somewhat hypertensive 4/18: Anxious.  More short of breath, getting Lasix.  Steroids ordered.  Seem to improve with this.  CT of chest was obtained this was negative for pulmonary emboli.  There was chronic right lower lobe changes also progression of right hilar soft tissue mass 4/20 through 24: No complaints respiratory issues 4/22 steroids stopped 4/25 confused 4/27: Desaturated, required 1 L of oxygen. 4/30 more short of breath again more anxious.  Pulmonary asked to evaluate   Consults:  Pulmonary  Procedures:  Not applicable  Significant Diagnostic Tests:  CT chest 4/18: Negative for pulmonary embolus.  There has been interval progression of soft tissue in the inferior right hilum.  Also progression of the posterior right lower lobe collapse/consolidation stable mediastinal lymph nodes   Micro Data:    Antimicrobials:    Interim history/subjective:  More short of breath pulmonary asked to evaluate  Objective   Blood pressure 140/62, pulse 82, temperature 98.3 F (36.8 C),  temperature source Oral, resp. rate 18, height 5\' 5"  (1.651 m), weight 76.4 kg,  SpO2 99 %.        Intake/Output Summary (Last 24 hours) at 08/02/2018 0957 Last data filed at 08/01/2018 1820 Gross per 24 hour  Intake 200 ml  Output no documentation  Net 200 ml   Filed Weights   07/31/18 0500 08/01/18 0453 08/02/18 0500  Weight: 74.9 kg 76.6 kg 76.4 kg    Examination: General: frail 85 year old white male. No distress HENT: NCAT no JVD. MM dry caked pill residual in post pharynx  Lungs: crackles L base, decreased t/o, some accessory use no wheezing turbulent upper airway noises  Cardiovascular: RRR Abdomen: soft not tender  Extremities: warm and dry no sig edema scattered areas of ecchymosis  Neuro: awake, confused. Very hard of hearing and deconditioned.  GU: inc urine   Resolved Hospital Problem list     Assessment & Plan:   Recent thalamic intracranial hemorrhage Recurrent dyspnea History of adenocarcinoma of the lung status post SBRT c/b what is felt to be possible radiation pneumonitis, further complicated by evidence of disease progression on last CT scan 4/18 History of rheumatoid arthritis History of COPD History of PAF History of DVT/PE (most recent imaging negative on 4/18) Hypertension CKD stage III (1.41 baseline).  Depression  Anemia of chronic disease  Discussion  Suspect multifactorial. Has probable disease progression and also fairly sig RLL volume loss seemingly a degree of underlying radiation pneumonitis and what looks like worsening atelectatic changes. He seems fairly deconditioned, also w/ his cognitive deficits I would worry about intermittent aspiration vs mucous plugging. It does look like he could have some Trush although some of that may be just old pill residual as well. I'm not sure that there is much in the way to help him.    Plan/rec Add flutter  Cont BDs (X 48 hrs scheduled then PRN) Aspiration precautions Will add nystatin Don't think he needs diuretics No role for prednisone here Most importantly he needs  palliative care consult and DNR  We will sign off call as needed   Erick Colace ACNP-BC [place] Pager # [phone] OR # [phone] if no answer    Labs   CBC: Recent Labs  Lab 07/27/18 0553 07/30/18 0559 08/02/18 0651  WBC 12.9* 11.2* 15.0*  NEUTROABS 9.6* 7.8* 11.9*  HGB 11.9* 12.0* 12.2*  HCT 35.6* 36.4* 36.8*  MCV 93.9 94.3 94.4  PLT 179 242 297    Basic Metabolic Panel: Recent Labs  Lab 07/27/18 0553 07/30/18 0559 08/02/18 0651  NA 135 134* 132*  K 4.3 4.4 4.2  CL 106 106 102  CO2 18* 21* 19*  GLUCOSE 108* 111* 110*  BUN 38* 23 21  CREATININE 1.49* 1.41* 1.55*  CALCIUM 8.9 8.6* 8.7*   GFR: Estimated Creatinine Clearance: 31.5 mL/min (A) (by C-G formula based on SCr of 1.55 mg/dL (H)). Recent Labs  Lab 07/27/18 0553 07/30/18 0559 08/02/18 0651  WBC 12.9* 11.2* 15.0*    Liver Function Tests: No results for input(s): AST, ALT, ALKPHOS, BILITOT, PROT, ALBUMIN in the last 168 hours. No results for input(s): LIPASE, AMYLASE in the last 168 hours. No results for input(s): AMMONIA in the last 168 hours.  ABG    Component Value Date/Time   PHART 7.491 (H) 07/21/2018 1518   PCO2ART 28.4 (L) 07/21/2018 1518   PO2ART 62.5 (L) 07/21/2018 1518   HCO3 21.5 07/21/2018 1518   ACIDBASEDEF 1.5 07/21/2018 1518  O2SAT 92.2 07/21/2018 1518     Coagulation Profile: No results for input(s): INR, PROTIME in the last 168 hours.  Cardiac Enzymes: No results for input(s): CKTOTAL, CKMB, CKMBINDEX, TROPONINI in the last 168 hours.  HbA1C: Hgb A1c MFr Bld  Date/Time Value Ref Range Status  12/02/2016 04:32 PM 5.3 <5.7 % Final    Comment:      For the purpose of screening for the presence of diabetes:   <5.7%       Consistent with the absence of diabetes 5.7-6.4 %   Consistent with increased risk for diabetes (prediabetes) >=6.5 %     Consistent with diabetes   This assay result is consistent with a decreased risk of diabetes.    Currently, no consensus exists regarding use of hemoglobin A1c for diagnosis of diabetes in children.   According to American Diabetes Association (ADA) guidelines, hemoglobin A1c <7.0% represents optimal control in non-pregnant diabetic patients. Different metrics may apply to specific patient populations. Standards of Medical Care in Diabetes (ADA).       CBG: No results for input(s): GLUCAP in the last 168 hours.  Review of Systems:    Review of Systems - Negative except shortness of breath. Not able to really get much hx due to confusion and shortness of breath   Past Medical History  He,  has a past medical history of Adenocarcinoma of right lung ([place]) (01/06/2016), Anginal chest pain at rest [facility]), Anxiety, Arthritis, BPH (benign prostatic hyperplasia), CAD (coronary artery disease), Chronic lower back pain, Complication of anesthesia, Depression, DJD (degenerative joint disease), Esophageal ulcer without bleeding, Family history of adverse reaction to anesthesia, GERD (gastroesophageal reflux disease), Headache, Hemochromatosis, Hepatitis, History of ASCVD, Hyperlipidemia, Hypertension, Myocardial infarction ([place]) (1999), Osteoarthritis, Pneumonia, Rheumatoid arthritis ([place]), Subdural hematoma ([place]), and 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[place]) (02/2018).   Surgical History    Past Surgical History:  Procedure Laterality Date  . ARM SKIN LESION BIOPSY / EXCISION Left 09/02/2015  . CATARACT EXTRACTION W/ INTRAOCULAR LENS  IMPLANT, BILATERAL Bilateral   . CHOLECYSTECTOMY OPEN    . CORNEAL TRANSPLANT Bilateral    "one at [facility]; one at [facility]"  . CORONARY ANGIOPLASTY WITH STENT PLACEMENT    . CORONARY ARTERY BYPASS GRAFT  1999  . DESCENDING AORTIC ANEURYSM REPAIR W/ STENT     DE stent ostium into the right radial free graft at OM-, 02-2007  . ESOPHAGOGASTRODUODENOSCOPY N/A 11/09/2014   Procedure: ESOPHAGOGASTRODUODENOSCOPY (EGD);  Surgeon: [name], MD;  Location: [facility] ENDOSCOPY;  Service: Endoscopy;   Laterality: N/A;  . INGUINAL HERNIA REPAIR    . JOINT REPLACEMENT    . LEFT HEART CATH AND CORS/GRAFTS ANGIOGRAPHY N/A 06/27/2017   Procedure: LEFT HEART CATH AND CORS/GRAFTS ANGIOGRAPHY;  Surgeon: [name], MD;  Location: [place] CV LAB;  Service: Cardiovascular;  Laterality: N/A;  . NASAL SINUS SURGERY    . SHOULDER OPEN ROTATOR CUFF REPAIR Right   . TOTAL KNEE ARTHROPLASTY Bilateral      Social History   reports that he has never smoked. He has quit using smokeless tobacco.  His smokeless tobacco use included chew. He reports that he does not drink alcohol or use drugs.   Family History   His family history includes Arthritis-Osteo in his sister; Heart attack in his brother and another family member. There is no history of Cancer.   Allergies Allergies  Allergen Reactions  . Feldene [Piroxicam] Other (See Comments)    REACTION: Blisters  . Statins Other (See Comments)  Myalgias  . Doxycycline Other (See Comments)    REACTION:  unknown  . Latex Rash  . Tequin Anxiety  . Valium Other (See Comments)    REACTION:  unknown     Home Medications  Prior to Admission medications   Medication Sig Start Date End Date Taking? Authorizing Provider  acetaminophen (TYLENOL) 325 MG tablet Take 2 tablets (650 mg total) by mouth every 4 (four) hours as needed for mild pain (or temp > 37.5 C (99.5 F)). 07/16/18   Donzetta Starch, NP  camphor-menthol [facility]) lotion Apply topically as needed for itching. Patient taking differently: Apply 1 application topically daily as needed for itching.  06/04/18   Thurnell Lose, MD  Cholecalciferol 2000 units TABS Take 1 tablet (2,000 Units total) by mouth daily. 02/04/16   [name], MD  ENSURE (ENSURE) Take 237 mLs by mouth every other day.     [provider]  FLUoxetine (PROZAC) 20 MG tablet Take 20 mg by mouth daily.    [provider]  furosemide (LASIX) 20 MG tablet Take 1 tablet (20 mg total) by mouth daily.  07/17/18   Donzetta Starch, NP  hydrOXYzine (ATARAX/VISTARIL) 25 MG tablet Take 1 tablet (25 mg total) by mouth every 8 (eight) hours as needed for itching. 06/04/18   Thurnell Lose, MD  Lifitegrast Shirley Friar) 5 % SOLN Place 1-2 drops into both eyes daily.     [provider]  LORazepam (ATIVAN) 0.5 MG tablet Take 1 tablet (0.5 mg total) by mouth 3 (three) times daily. 07/16/18   Donzetta Starch, NP  magnesium oxide (MAG-OX) 400 (241.3 Mg) MG tablet Take 1 tablet (400 mg total) by mouth 2 (two) times daily. 04/02/18   [name], MD  metoprolol tartrate (LOPRESSOR) 25 MG tablet Take 0.5 tablets (12.5 mg total) by mouth 2 (two) times daily. 07/16/18   Donzetta Starch, NP  nitroGLYCERIN (NITROSTAT) 0.4 MG SL tablet Place 1 tablet (0.4 mg total) under the tongue every 5 (five) minutes as needed for chest pain. 12/06/17   Daune Perch, NP  pantoprazole (PROTONIX) 40 MG tablet Take 1 tablet (40 mg total) by mouth every other day. 07/18/18   Donzetta Starch, NP  rosuvastatin (CRESTOR) 5 MG tablet Take 1 tablet (5 mg total) by mouth daily at 6 PM. 07/16/18   Biby, Massie Kluver, NP  senna-docusate (SENOKOT-S) 8.6-50 MG tablet Take 1 tablet by mouth 2 (two) times daily. 07/16/18   Donzetta Starch, NP  tamsulosin (FLOMAX) 0.4 MG CAPS capsule Take 1 capsule (0.4 mg total) by mouth daily. 07/17/18   Donzetta Starch, NP     Critical care time: NA      Erick Colace ACNP-BC [place] Pager # [phone] OR # [phone] if no answer

## 2018-08-02 NOTE — Patient Care Conference (Cosign Needed Addendum)
Inpatient RehabilitationTeam Conference and Plan of Care Update Date: 08/01/2018   Time: 2:35 PM    Patient Name: Evan [name]      Medical Record Number: [id number]  Date of Birth: 10-29-1927 Sex: Male         Room/Bed: 4M04C/4M04C-01 Payor Info: Payor: [employer] MEDICARE / Plan: [place] HMO / Product Type: *No Product type* /    Admitting Diagnosis: ich  Admit Date/Time:  07/16/2018  2:46 PM Admission Comments: No comment available   Primary Diagnosis:  <principal problem not specified> Principal Problem: <principal problem not specified>  Patient Active Problem List   Diagnosis Date Noted  . Anxiety state   . Fatigue   . Labile blood glucose   . Hyponatremia   . SOB (shortness of breath)   . Leukocytosis   . Hyperglycemia   . Labile blood pressure   . History of lung cancer   . Depression   . CKD (chronic kidney disease), stage III ([place])   . Acute blood loss anemia   . Fall 07/16/2018  . 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 Thalamic hemorrhage ([place]) 07/16/2018  . Acute spontaneous 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 ([place]) L thalamic 07/14/2018  . Cellulitis of arm, right 06/01/2018  . Rash and nonspecific skin eruption 06/01/2018  . Chest pain 02/02/2018  . Thrombocytopenia ([place]) 02/02/2018  . Hypokalemia 02/02/2018  . Pulmonary embolism ([place]) 02/02/2018  . Elevated troponin 02/02/2018  . Pulmonary embolus ([place]) 02/02/2018  . Non-ST elevation MI (NSTEMI) ([place]) 06/27/2017  . Diastolic CHF ([place]) 59/16/3846  . Chest pain due to CAD ([place]) 06/26/2017  . Dyspnea 06/21/2017  . Acute bronchitis 05/15/2017  . Oral candidiasis 05/15/2017  . Acute pericarditis   . Chest pain at rest 12/20/2016  . Angina pectoris ([place]) 12/20/2016  . Radiation pneumonitis ([place]) 11/09/2016  . Pleurisy 10/11/2016  . Chest wall pain 10/11/2016  . Atrial fibrillation ([place]) 05/21/2016  . Adenocarcinoma of right lung ([place]) 01/06/2016  . GAD (generalized  anxiety disorder) 11/20/2015  . Essential hypertension   . Cellulitis 09/04/2015  . Cellulitis of left axilla   . Esophageal ulcer without bleeding   . Bilateral lower extremity edema 04/28/2014  . BPH (benign prostatic hyperplasia) 10/08/2012  . Anxiety   . EKG abnormalities 09/05/2012  . Tremor 09/05/2012  . Chronic fatigue 09/05/2012  . Arthritis   . Asthma, chronic   . Reflux esophagitis   . S/P CABG (coronary artery bypass graft)   . GERD (gastroesophageal reflux disease)   . Hyperlipidemia   . Hypertension   . UNSPECIFIED PERIPHERAL VASCULAR DISEASE 06/16/2009    Expected Discharge Date: Expected Discharge Date: 08/15/18  Team Members Present: Physician leading conference: Dr. Delice Lesch Social Worker Present: Lennart Pall, LCSW Nurse Present: Leonette Nutting, RN PT Present: Burnard Bunting, PT OT Present: Mariane Masters, OT SLP Present: Weston Anna, SLP PPS Coordinator present : Gunnar Fusi     Current Status/Progress Goal Weekly Team Focus  Medical   Functional and cognitive deficits secondary to left thalamic hemorrhage  Improve mobility, cognition, CKD, hyponatremia, SOB  See above   Bowel/Bladder   incontinent of b/b at times, urgency; LBM: 04/  continent of B/B; maintain regular bowel pattern  continue time tolieting    Swallow/Nutrition/ Hydration   Dys. 2 textures with thin liquids, Mod A   Mod A  increased use of swallowing strategies    ADL's   Max A stand-pivot transfers, Max A LB ADLs, Min/mod A UB ADLs,  improved OOB tolerance  Min A  self-care retraining, activity tolerance, sit<>stand, transfers, R NMR, dc planning   Mobility   min-mod assist sit<>stands to RW, mod-max stand pivot transfer (getting more consistent), gait remains +2 at rail, SOB continues to improve w/ activity  min assist overall, w/c level   OOB tolerance/endurance, transfers, midline/postural control, NMR   Communication             Safety/Cognition/ Behavioral Observations   Mod-Max A   Mod A   orientation, problem solving    Pain   no c/o pain   remain free of pain   assess pain qshift and prn    Skin   ecchymosis on right neck/chest; skin t x2 on right arm; MASD on buttocks  resolution of current issues no further skin breakdown/infection; treat current skin issues per orders  assess skin qshift and prn    Rehab Goals Patient on target to meet rehab goals: No Rehab Goals Revised: Making very slow progress *See Care Plan and progress notes for long and short-term goals.     Barriers to Discharge  Current Status/Progress Possible Resolutions Date Resolved   Physician    Medical stability;Behavior     See above  Therapies, follow labs, cont supplemental O2      Nursing                  PT                    OT                  SLP                SW                Discharge Planning/Teaching Needs:  Pt to return home with family providing 24/7 assistance.    Teaching to be planned closer to d/c.   Team Discussion:  No new medical issues; 2L02.  Mod assist tfs and max assist amb with rail.  PT goals still min/mod w/c level.  Not ready to upgrade OT goals.    Revisions to Treatment Plan:  NA    Continued Need for Acute Rehabilitation Level of Care: The patient requires daily medical management by a physician with specialized training in physical medicine and rehabilitation for the following conditions: Daily direction of a multidisciplinary physical rehabilitation program to ensure safe treatment while eliciting the highest outcome that is of practical value to the patient.: Yes Daily medical management of patient stability for increased activity during participation in an intensive rehabilitation regime.: Yes Daily analysis of laboratory values and/or radiology reports with any subsequent need for medication adjustment of medical intervention for : Renal problems;Other;Neurological problems;Pulmonary problems   I attest that I was present, lead  the team conference, and concur with the assessment and plan of the team.   Donata Clay, Mckale Haffey 08/02/2018, 4:06 PM    Team conference was held via web/ teleconference due to [place] - 22

## 2018-08-02 NOTE — Progress Notes (Signed)
Occupational Therapy Session Note  Patient Details  Name: CAMDEN KNOTEK MRN: [id number] Date of Birth: [birth year]  Today's Date: 08/02/2018 OT Individual Time: 0930-1015 OT Individual Time Calculation (min): 45 min  and Today's Date: 08/02/2018 OT Missed Time: 30 Minutes Missed Time Reason: Patient fatigue   Short Term Goals: Week 3:  OT Short Term Goal 1 (Week 3): Pt will complete toilet transfer with mod A OT Short Term Goal 2 (Week 3): Pt will maintain standing balance for 1 minute with no more than mod A OT Short Term Goal 3 (Week 3): Pt will use R UE for 50% of bathing tasks with mod verbal cues  Skilled Therapeutic Interventions/Progress Updates:    Pt greeted semi-reclined in bed, awake, on 1L of O2 with SpO2 at 97%. Pt with visible increased work of breathing. Pt stated "I have to pee" so assisted pt with urinal placement in bed. Pt able to then hold urinal in place, but only able to void a small amount. Pt agreeable to try to sit EOB. Pt tolerated sitting EOB for 15 mins with intermittent labored breathing, but it would come and go. SpO2 maintained above 90%. Had pt lean onto L elbow to decrease pushing to the R. Tried to complete UB dressing sitting EOB with increased pushing noted w/ increased task demand. Max verbal and tactile cues to try to get pt to thread R sleeve, but needed OT assistance and mod A for sitting balance now. Max A for UB dressing seated EOB today. Pt incontinent of bladder and small BM in brief, and pt agreeable to try to stand. Sit<>stand using Stedy with R LE extensor tone making R LE slip off of stedy. Returned to sitting, and OT adjusted R LE positioning. Pt then able to stand for brief time while OT changed brief and provided standing balance support on R side. Max verbal cues to come to midline for brief period. Pt visibly fatigued with increased work of breathing, so OT assisted pt back to supine in bed. Pt unable to participate further at this time. Pt left  semi-reclined in bed with call bell in reach and needs met.   Therapy Documentation Precautions:  Precautions Precautions: Fall Precaution Comments: bruising along R side of neck Restrictions Weight Bearing Restrictions: No Pain:   denies pain  Therapy/Group: Individual Therapy  Valma Cava 08/02/2018, 10:27 AM

## 2018-08-02 NOTE — Progress Notes (Signed)
[place] PHYSICAL MEDICINE & REHABILITATION PROGRESS NOTE  Subjective/Complaints: Patient seen sitting up in bed this morning.  He slept fairly overnight.  He notes shortness of breath this morning.  His pulse ox is not on.  However, discussed with nursing, overnight no issues with desaturations.  Patient has not received his morning anxiolytics.  ROS: Limited due to [facility], but appears to deny complain of shortness of breath, denies SOB, N/V/D.  Objective: Vital Signs: Blood pressure (!) 141/58, pulse 79, temperature 98.3 F (36.8 C), temperature source Oral, resp. rate 18, height 5\' 5"  (1.651 m), weight 76.4 kg, SpO2 99 %. No results found. Recent Labs    08/02/18 0651  WBC 15.0*  HGB 12.2*  HCT 36.8*  PLT 207   No results for input(s): NA, K, CL, CO2, GLUCOSE, BUN, CREATININE, CALCIUM in the last 72 hours.  Physical Exam: BP (!) 141/58 (BP Location: Left Arm)   Pulse 79   Temp 98.3 F (36.8 C) (Oral)   Resp 18   Ht 5\' 5"  (1.651 m)   Wt 76.4 kg   SpO2 99%   BMI 28.03 kg/m  Constitutional: + Distressed. Vital signs reviewed. HENT: Normocephalic.  Atraumatic. Eyes: EOMI.  No discharge. Cardiovascular: No JVD. Respiratory: Increased respiratory rate with shallow breaths.  GI: Non-distended. Musc: No edema or tenderness in extremities. Neurological:  He alert. Extremely HOH.  Motor:  RUE: Shoulder abduction 4/5, elbow flexion/extension 4/5, handgrip 4/5, stable Right lower extremity: Hip flexion, knee extension 4--4/5, ankle dorsiflexion 4-/4/5, stable Skin: Right neck with ecchymosis and hard indurated area under chin, improving.  Psychiatric: Anxious   Assessment/Plan: 1. 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  Physiatrist is providing close team supervision and 24 hour management of active medical problems listed below.  Physiatrist and  rehab team continue to assess barriers to discharge/monitor patient progress toward functional and medical goals  Care Tool:  Bathing    Body parts bathed by patient: Left arm, Chest, Abdomen, Front perineal area   Body parts bathed by helper: Right arm, Left arm, Chest, Abdomen, Front perineal area, Right lower leg, Left upper leg, Right upper leg, Buttocks, Left lower leg, Face     Bathing assist Assist Level: Maximal Assistance - Patient 24 - 49%     Upper Body Dressing/Undressing Upper body dressing Upper body dressing/undressing activity did not occur (including orthotics): N/A What is the patient wearing?: Pull over shirt    Upper body assist Assist Level: Moderate Assistance - Patient 50 - 74%    Lower Body Dressing/Undressing Lower body dressing    Lower body dressing activity did not occur: N/A What is the patient wearing?: Incontinence brief, Pants     Lower body assist Assist for lower body dressing: Total Assistance - Patient < 25%     Toileting Toileting Toileting Activity did not occur Landscape architect and hygiene only): N/A (no void or bm)  Toileting assist Assist for toileting: Maximal Assistance - Patient 25 - 49%     Transfers Chair/bed transfer  Transfers assist  Chair/bed transfer activity did not occur: Safety/medical concerns  Chair/bed transfer assist level: Moderate Assistance - Patient 50 - 74%     Locomotion Ambulation   Ambulation assist   Ambulation activity did not occur: Safety/medical concerns  Assist level: Maximal Assistance - Patient 25 - 49% Assistive device: Other (comment)(rail in hallway) Max distance: 15'   Walk 10 feet  activity   Assist  Walk 10 feet activity did not occur: Safety/medical concerns  Assist level: Maximal Assistance - Patient 25 - 49% Assistive device: Other (comment)(rail in hallway)   Walk 50 feet activity   Assist Walk 50 feet with 2 turns activity did not occur: Safety/medical  concerns         Walk 150 feet activity   Assist Walk 150 feet activity did not occur: Safety/medical concerns         Walk 10 feet on uneven surface  activity   Assist Walk 10 feet on uneven surfaces activity did not occur: Safety/medical concerns         Wheelchair     Assist Will patient use wheelchair at discharge?: Yes Type of Wheelchair: Manual    Wheelchair assist level: Supervision/Verbal cueing Max wheelchair distance: 20'    Wheelchair 50 feet with 2 turns activity    Assist        Assist Level: Dependent - Patient 0%   Wheelchair 150 feet activity     Assist     Assist Level: Dependent - Patient 0%      Medical Problem List and Plan: 1.Functional and cognitive 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  Cont CIR  CT head performed on 4/20 showing slight improvement.  Please see PA note-discussed with neurology, repeat CT personally reviewed- continued improvement in hemorrhagic infarct, after PA discussion with neurology, ASA initiated. 2. Antithrombotics: -PE/LLEDVT/anticoagulation:None due to [place].  PE resolved on most recent CTA. -antiplatelet therapy: ASA 3. Pain Management:tylenol prn. 4. Mood:LCSW to follow for evaluation and support. antipsychotic agents: N/A.   Ativan tid PTA, increased to 0.75 TID on 4/21  Prozac. 5. Neuropsych: This patientis not fullycapable of making decisions onhisown behalf. 6. Skin/Wound Care:Monitor right neck hematoma for resolution. 7. Fluids/Electrolytes/Nutrition:Monitor I/O.  8.ABLA: Continue to monitor H/H/Platelets with serial checks. Monitor for signs of bleeding.   Hemoglobin 12.2 on 4/30  Continue to monitor 9. Right forearm wound/ recent cellulitis:Completed course of Keflex on 4/16. Added Aqacell to wound as per derm records. 10.CKD stage III: Avoid nephrotoxic medications. Monitor with serial checks.   Creatinine 1.41 on 4/27,  labs pending  Encourage fluids  Continue to monitor 11. H/o A fib: Monitor HR bid. Continue metoprolol bid--off Lovenox.  12. H/o Depression: On Prozac daily with ativan tid.  15. H/o Lung cancer: Had PE on Eliquis. No antiplatelets or anticoagulation for now.   CTA on 4/18- PE resolved, progression of right hilum abnormality, chronic radiation scarring  Off  steroids  Added Mucinex dm to help with cough/clearance  -encourage IS/FV  -pulmonary toilet   Patient with shortness of breath again.  He does have significant anxiety, however does have organic factors that may be contributing.  Will discuss with pulmonology.  Discussed with PA. 14. Labile blood pressure-    Vitals:   08/01/18 1943 08/02/18 0443  BP: 129/64 (!) 141/58  Pulse: 72 79  Resp: 20 18  Temp: (!) 97.5 F (36.4 C) 98.3 F (36.8 C)  SpO2: 91% 99%   Slightly labile on 4/30 15.  Steroid-induced hyperglycemia  Off steroids now, remains elevated 16. Leukocytosis:    WBCs 15.0 on 4/30   Afebrile  CXR ordered 17.  Hyponatremia  Sodium 134 on 4/27, labs pending  LOS: 17 days A FACE TO [place] 08/02/2018, 8:55 AM

## 2018-08-03 ENCOUNTER — Inpatient Hospital Stay (HOSPITAL_COMMUNITY): Payer: Medicare HMO | Admitting: Physical Therapy

## 2018-08-03 ENCOUNTER — Inpatient Hospital Stay (HOSPITAL_COMMUNITY): Payer: Medicare HMO | Admitting: Speech Pathology

## 2018-08-03 ENCOUNTER — Inpatient Hospital Stay (HOSPITAL_COMMUNITY): Payer: Medicare HMO | Admitting: Occupational Therapy

## 2018-08-03 DIAGNOSIS — Z9981 Dependence on supplemental oxygen: Secondary | ICD-10-CM

## 2018-08-03 MED ORDER — FUROSEMIDE 20 MG PO TABS
20.0000 mg | ORAL_TABLET | Freq: Every day | ORAL | Status: DC
  Administered 2018-08-03 – 2018-08-15 (×13): 20 mg via ORAL
  Filled 2018-08-03 (×13): qty 1

## 2018-08-03 NOTE — Progress Notes (Signed)
Social Work Patient ID: Evan [name], male   DOB: [birth year], 85 y.o.   MRN: [id number]  Have reviewed team conference with pt's daughter, Evan [name].  She is aware that team continues to plan for 5/13 discharge date and w/c level goals. Aware slow progress overall and confusion continues to inhibit sessions.  Discussed with her that there may be a benefit to going ahead and having her/ family member come in next week to go ahead an begin family ed and form a clearer picture of care needs - will discuss with team.  Evan [name], Evan Engh, LCSW

## 2018-08-03 NOTE — Progress Notes (Signed)
Physical Therapy Session Note  Patient Details  Name: Evan [name] MRN: [id number] Date of Birth: Apr 24, 1927  Today's Date: 08/03/2018 PT Individual Time: 1300-1330 AND 1430-1445 PT Individual Time Calculation (min): 30 min AND 15 min  Short Term Goals: Week 3:  PT Short Term Goal 1 (Week 3): Pt will transfer bed<>chair w/ mod assist consistently PT Short Term Goal 2 (Week 3): Pt will ambulate 15' w/ mod assist using LRAD PT Short Term Goal 3 (Week 3): Pt will maintain static standing balance w/ min assist  PT Short Term Goal 4 (Week 3): Pt will self-propel w/c 50' w/ min assist   Skilled Therapeutic Interventions/Progress Updates:   Session 1:  Pt in supine and agreeable to therapy w/ encouragement, no c/o pain. Supplemental O2 on 1 L/min and pt >97% at rest, pt w/ labored breathing, SOB, and slightly cyanotic in face at rest today. Session focused on OOB tolerance. Supine to sit w/ min assist. O2 dropped to 79% once coming to seated, supplemental needed to be turned up to 3 L/min for pt to return to >90%. Rested for a few minutes until pt was at baseline labored breathing. Stand pivot transfer to w/c w/ mod assist and verbal/tactile cues for technique and upright posture. Reclined in TIS to allow for improved airflow and to decrease work of breathing after transfer. Pt continued to need 3 L/min supplemental O2 for 10+ minutes to maintain >95%. Gradually turned back down to 2 L/min by end of session, O2 sat remaining >96%. Once pt resting in chair, assisted w/ eating lunch. Total assist to eat 2/2 SOB and increased work of breathing at rest this session. Do not think pt would tolerate self-feeding and amount of SOB/labored breathing that would follow. Pt visibly shaking 2/2 feeling cold and cyanotic in lips and lower face throughout session, however denied feeling anything other than SOB and cold. Covered w/ blankets in TIS and provided w/ hot pack on chest. Otherwise, pt stated he was resting  comfortably. Ended session in TIS, all needs in reach.  Session 2:  Pt in [place] and requesting to return to supine, SOB and labored breathing upon arrival. Total assist to change shirt as it had been soiled by lunch. SOB increasing w/ this task, however stayed >90% on 2 L/min supplemental O2. Mod assist stand pivot to EOB and mod assist sit>supine. Total assist for repositioning in bed for comfort and maximal air flow. Ended session in supine, all needs in reach. Missed 15 min of skilled PT 2/2 SOB.   Therapy Documentation Precautions:  Precautions Precautions: Fall Precaution Comments: bruising along R side of neck Restrictions Weight Bearing Restrictions: No Vital Signs:    Therapy/Group: Individual Therapy  Kyrstal Monterrosa Clent Demark 08/03/2018, 1:57 PM

## 2018-08-03 NOTE — Progress Notes (Signed)
[place] PHYSICAL MEDICINE & REHABILITATION PROGRESS NOTE  Subjective/Complaints: Patient seen initially laying in bed this morning.  He states he did not sleep well overnight.  No sleep chart.  He is about to work with SLP.  His bed was noted to be wet.  Assisted therapies and patient with transfers and bed change.  ROS: Limited due to [facility], but appears to deny complaints of shortness of breath, chest pain, N/V/D.  Objective: Vital Signs: Blood pressure 126/76, pulse 86, temperature 97.7 F (36.5 C), temperature source Oral, resp. rate 16, height 5\' 5"  (1.651 m), weight 75.8 kg, SpO2 94 %. Dg Chest Port 1 View  Result Date: 08/02/2018 CLINICAL DATA:  Atelectasis. EXAM: PORTABLE CHEST 1 VIEW COMPARISON:  CT chest and chest x-ray dated July 21, 2018. FINDINGS: Stable cardiomediastinal silhouette. Atherosclerotic calcification of the aortic arch. Mildly increased interstitial markings at the lung bases are similar to prior study. Unchanged mild bibasilar atelectasis and trace bilateral pleural effusions. No consolidation or pneumothorax. No acute osseous abnormality. IMPRESSION: 1. Similar appearing bibasilar atelectasis, trace pleural effusions, and mild interstitial edema. Electronically Signed   By: Titus Dubin M.D.   On: 08/02/2018 11:11   Recent Labs    08/02/18 0651  WBC 15.0*  HGB 12.2*  HCT 36.8*  PLT 207   Recent Labs    08/02/18 0651  NA 132*  K 4.2  CL 102  CO2 19*  GLUCOSE 110*  BUN 21  CREATININE 1.55*  CALCIUM 8.7*    Physical Exam: BP 126/76   Pulse 86   Temp 97.7 F (36.5 C) (Oral)   Resp 16   Ht 5\' 5"  (1.651 m)   Wt 75.8 kg   SpO2 94%   BMI 27.81 kg/m  Constitutional: + Distressed. Vital signs reviewed. HENT: Normocephalic.  Atraumatic. Eyes: EOMI.  No discharge. Cardiovascular: No JVD. Respiratory: Normal effort GI: Non-distended. Musc: No edema or tenderness in extremities. Neurological:  He alert. Extremely HOH.  Motor:  RUE:  Shoulder abduction 4/5, elbow flexion/extension 4/5, handgrip 4/5, unchanged Right lower extremity: Hip flexion, knee extension 4--4/5, ankle dorsiflexion 4-/4/5, unchanged Skin: Right neck with ecchymosis and hard indurated area under chin, improving.  Psychiatric: Anxious   Assessment/Plan: 1. 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  Physiatrist is providing close team supervision and 24 hour management of active medical problems listed below.  Physiatrist and rehab team continue to assess barriers to discharge/monitor patient progress toward functional and medical goals  Care Tool:  Bathing    Body parts bathed by patient: Left arm, Chest, Abdomen, Front perineal area   Body parts bathed by helper: Right arm, Left arm, Chest, Abdomen, Front perineal area, Right lower leg, Left upper leg, Right upper leg, Buttocks, Left lower leg, Face     Bathing assist Assist Level: Maximal Assistance - Patient 24 - 49%     Upper Body Dressing/Undressing Upper body dressing Upper body dressing/undressing activity did not occur (including orthotics): N/A What is the patient wearing?: Pull over shirt    Upper body assist Assist Level: Moderate Assistance - Patient 50 - 74%    Lower Body Dressing/Undressing Lower body dressing    Lower body dressing activity did not occur: N/A What is the patient wearing?: Incontinence brief, Pants     Lower body assist Assist for lower body dressing: Total Assistance - Patient < 25%     Naval architect  Activity did not occur (Clothing management and hygiene only): N/A (no void or bm)  Toileting assist Assist for toileting: Maximal Assistance - Patient 25 - 49%     Transfers Chair/bed transfer  Transfers assist  Chair/bed transfer activity did not occur: Safety/medical concerns  Chair/bed transfer assist level:  Moderate Assistance - Patient 50 - 74%     Locomotion Ambulation   Ambulation assist   Ambulation activity did not occur: Safety/medical concerns  Assist level: Maximal Assistance - Patient 25 - 49% Assistive device: Other (comment)(rail in hallway) Max distance: 15'   Walk 10 feet activity   Assist  Walk 10 feet activity did not occur: Safety/medical concerns  Assist level: Maximal Assistance - Patient 25 - 49% Assistive device: Other (comment)(rail in hallway)   Walk 50 feet activity   Assist Walk 50 feet with 2 turns activity did not occur: Safety/medical concerns         Walk 150 feet activity   Assist Walk 150 feet activity did not occur: Safety/medical concerns         Walk 10 feet on uneven surface  activity   Assist Walk 10 feet on uneven surfaces activity did not occur: Safety/medical concerns         Wheelchair     Assist Will patient use wheelchair at discharge?: Yes Type of Wheelchair: Manual    Wheelchair assist level: Supervision/Verbal cueing Max wheelchair distance: 20'    Wheelchair 50 feet with 2 turns activity    Assist        Assist Level: Dependent - Patient 0%   Wheelchair 150 feet activity     Assist     Assist Level: Dependent - Patient 0%      Medical Problem List and Plan: 1.Functional and cognitive 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  Cont CIR  CT head performed on 4/20 showing slight improvement.  Please see PA note-discussed with neurology, repeat CT personally reviewed- continued improvement in hemorrhagic infarct, after PA discussion with neurology, ASA initiated. 2. Antithrombotics: -PE/LLEDVT/anticoagulation:None due to [place].  PE resolved on most recent CTA. -antiplatelet therapy: ASA 3. Pain Management:tylenol prn. 4. Mood:LCSW to follow for evaluation and support. antipsychotic agents: N/A.   Ativan tid PTA, increased to 0.75 TID on  4/21  Prozac. 5. Neuropsych: This patientis not fullycapable of making decisions onhisown behalf. 6. Skin/Wound Care:Monitor right neck hematoma for resolution. 7. Fluids/Electrolytes/Nutrition:Monitor I/O.  8.ABLA: Continue to monitor H/H/Platelets with serial checks. Monitor for signs of bleeding.   Hemoglobin 12.2 on 4/30  Continue to monitor 9. Right forearm wound/ recent cellulitis:Completed course of Keflex on 4/16. Added Aqacell to wound as per derm records. 10.CKD stage III: Avoid nephrotoxic medications. Monitor with serial checks.   Creatinine 1.55 on 4/30  Encourage fluids  Continue to monitor 11. H/o A fib: Monitor HR bid. Continue metoprolol bid--off Lovenox.  12. H/o Depression: On Prozac daily with ativan tid.  46. H/o Lung cancer: Had PE on Eliquis. No antiplatelets or anticoagulation for now.   CTA on 4/18- PE resolved, progression of right hilum abnormality, chronic radiation scarring  Off  steroids  Added Mucinex dm to help with cough/clearance  -encourage IS/FV  -pulmonary toilet   Continue supplemental oxygen  Chest x-ray with some fluid, BNP elevated  Lasix 20 started on 5/1  Patient was seen by Pulm yesterday- no new recommendations.  Will discuss palliative care with family.  Discussed with PA. 14. Labile blood pressure-    Vitals:   08/03/18 0439  08/03/18 0929  BP: 124/75 126/76  Pulse: 83 86  Resp: 16   Temp: 97.7 F (36.5 C)   SpO2: 94%    Slightly labile on 5/1 15.  Steroid-induced hyperglycemia  Off steroids now, remains elevated 16. Leukocytosis:    WBCs 15.0 on 4/30   Afebrile  CXR reviewed, unremarkable for infection 17.  Hyponatremia  Sodium 132 on 4/30  LOS: 18 days A FACE TO FACE EVALUATION WAS PERFORMED  Kayna Suppa Lorie Phenix 08/03/2018, 9:40 AM

## 2018-08-03 NOTE — Progress Notes (Signed)
Occupational Therapy Session Note  Patient Details  Name: Evan [name] MRN: [id number] Date of Birth: Aug 14, 1927  Today's Date: 08/03/2018 OT Individual Time: 6184-8592 OT Individual Time Calculation (min): 70 min   Short Term Goals: Week 3:  OT Short Term Goal 1 (Week 3): Pt will complete toilet transfer with mod A OT Short Term Goal 2 (Week 3): Pt will maintain standing balance for 1 minute with no more than mod A OT Short Term Goal 3 (Week 3): Pt will use R UE for 50% of bathing tasks with mod verbal cues  Skilled Therapeutic Interventions/Progress Updates:    Pt greeted semi-reclined in bed, awake, and alert, with nurse tech about to eat with pt. OT took over feeding as pt was too lethargic to eat earlier in the morning. Positioning pt in bed upright. Pt able to grasp cup with L hand and bring to mouth for drinking. Pt unable to manipulate feeding utensil with L hand and keep food bolus on fork when bringing to mouth, so OT had to feed patient. Pt with less SOB today, but still needed rest breaks in between bites to coordinate breathing and swallow.  After eating, pt came to sitting EOB with mod A. Pt with better sitting balance today and maintained midline for 3 mins at EOB with supervision. Sit<>stand in stedy to change brief 2/2 urinary incontinence. Min A sit<>stand and used verbal cues to bring pt to midline in standing. Pt with increased push to thr R with fatigue. Pt incontinent of large BM and had to call nurse tech for assistance. +2 required for peri-care and brief change with second person helped maintain standing balance in [place]. Nurse tech had to change bedding 2/2 incontinence, but pt was able to stay sitting in stedy with OT focusing on midline orientation while bedding being changed. Pt returned to bed at end of session and left upright in chair position with R UE supported on pillow.   Therapy Documentation Precautions:  Precautions Precautions: Fall Precaution Comments:  bruising along R side of neck Restrictions Weight Bearing Restrictions: No Pain:   denies pain  Therapy/Group: Individual Therapy  Valma Cava 08/03/2018, 10:46 AM

## 2018-08-03 NOTE — Progress Notes (Signed)
Speech Language Pathology Daily Session Note  Patient Details  Name: Evan [name] MRN: [id number] Date of Birth: 1928-03-05  Today's Date: 08/03/2018 SLP Individual Time: 0730-0800 SLP Individual Time Calculation (min): 30 min  Missed Time: 15 mins, fatigue   Short Term Goals: Week 3: SLP Short Term Goal 1 (Week 3): Pt will demonstrate orientation x4 with mod verbal cues. SLP Short Term Goal 2 (Week 3): Pt will complete basic problem solving tasks with min assist given verbal cues. SLP Short Term Goal 3 (Week 3): Patient will consume current diet with minimal overt s/s of aspiration and overall Min A verbal cues for use of swallowing compensatory strategies.   Skilled Therapeutic Interventions: Skilled treatment session focused on cognitive goals. Upon arrival, patient was supine in bed and had spilled a cup of water on him and his bed was soaked. Patient sat EOB with Max A and required Max A to maintain sitting balance. Patient stood in the steady with Max A and required Max A to maintain sitting balance due to strong lean to the right. Patient's bed was changed and patient transferred back to bed due to fatigue. Once comfortable, patient fell asleep and declined to eat due to fatigue. Therefore, patient missed remaining 15 mins of session. Patient left semi-reclined in bed with alarm on and all needs within reach. Continue with current plan of care.      Pain No/Denies Pain   Therapy/Group: Individual Therapy  Buna Cuppett 08/03/2018, 12:49 PM

## 2018-08-04 ENCOUNTER — Inpatient Hospital Stay (HOSPITAL_COMMUNITY): Payer: Medicare HMO

## 2018-08-04 ENCOUNTER — Inpatient Hospital Stay (HOSPITAL_COMMUNITY): Payer: Medicare HMO | Admitting: Occupational Therapy

## 2018-08-04 NOTE — Progress Notes (Signed)
Occupational Therapy Session Note  Patient Details  Name: NICKIE DEREN MRN: [id number] Date of Birth: Aug 27, 1927  Today's Date: 08/04/2018 OT Individual Time: 1000-1100 OT Individual Time Calculation (min): 60 min    Short Term Goals: Week 3:  OT Short Term Goal 1 (Week 3): Pt will complete toilet transfer with mod A OT Short Term Goal 2 (Week 3): Pt will maintain standing balance for 1 minute with no more than mod A OT Short Term Goal 3 (Week 3): Pt will use R UE for 50% of bathing tasks with mod verbal cues  Skilled Therapeutic Interventions/Progress Updates:    Pt greeted semi-reclined in bed with nursing finishing giving pt medications. Pt noted to bed incontinent of urine. Worked on bed mobility while changing brief with pt able to roll L and R with CGA. Once OT removed brief to clean pt, he was incontinent of urine and BM all over the bed. Extended time spent cleaning pt and changing bed pad. Pt came to sitting EOB with mod A. Worked on sitting balance with min A. Stedy then used to transfer pt to wc with verbal cues and facilitation to achieve hip/trunk extension. OT stayed on R side and transferred pt to TIS wc. OT changed out bed linens and wiped down bed while pt resting from transfer. Assisted with calling pt's son Marion, and he was able to hear him with L ear. Pt looked good sitting in TIS wc, so OT left pt up with instructions for nursing to return to bed in 30-1 hour. Pt left tilted in wc with safety belt on and needs met.   Therapy Documentation Precautions:  Precautions Precautions: Fall Precaution Comments: bruising along R side of neck Restrictions Weight Bearing Restrictions: No Vital Signs: Therapy Vitals Pulse Rate: 71 BP: (!) 113/53 Pain:   denies pain  Therapy/Group: Individual Therapy  Valma Cava 08/04/2018, 10:52 AM

## 2018-08-04 NOTE — Progress Notes (Signed)
[place] PHYSICAL MEDICINE & REHABILITATION PROGRESS NOTE  Subjective/Complaints: Patient seen laying in bed this morning.  He slept fairly overnight per sleep chart.  Patient states he did not sleep at all even though he is resting comfortably prior to arousal.  Temperature noted to be slightly elevated overnight, discussed with nursing, patient with several blankets.  BP also noted to be relatively low.  ROS: Limited due to [facility], but appears to deny complaints of shortness of breath, chest pain, N/V/D.  Objective: Vital Signs: Blood pressure (!) 113/53, pulse 71, temperature 98.3 F (36.8 C), temperature source Oral, resp. rate (!) 22, height 5\' 5"  (1.651 m), weight 73.5 kg, SpO2 97 %. No results found. Recent Labs    08/02/18 0651  WBC 15.0*  HGB 12.2*  HCT 36.8*  PLT 207   Recent Labs    08/02/18 0651  NA 132*  K 4.2  CL 102  CO2 19*  GLUCOSE 110*  BUN 21  CREATININE 1.55*  CALCIUM 8.7*    Physical Exam: BP (!) 113/53   Pulse 71   Temp 98.3 F (36.8 C) (Oral) Comment: after removing several blanket  Resp (!) 22   Ht 5\' 5"  (1.651 m)   Wt 73.5 kg   SpO2 97%   BMI 26.96 kg/m  Constitutional: NAD. Vital signs reviewed. HENT: Normocephalic.  Atraumatic. Eyes: EOMI.  No discharge. Cardiovascular: No JVD. Respiratory: Normal effort GI: Non-distended. Musc: No edema or tenderness in extremities. Neurological:  He alert. Extremely HOH.  Motor:  RUE: Shoulder abduction 4/5, elbow flexion/extension 4/5, handgrip 4/5, improving Right lower extremity: Hip flexion, knee extension 4--4/5, ankle dorsiflexion 4-/4/5, improving Skin: Right neck with ecchymosis and hard indurated area under chin, improving.  Psychiatric: Anxious   Assessment/Plan: 1. 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  Physiatrist is providing close team supervision and  24 hour management of active medical problems listed below.  Physiatrist and rehab team continue to assess barriers to discharge/monitor patient progress toward functional and medical goals  Care Tool:  Bathing    Body parts bathed by patient: Left arm, Chest, Abdomen, Front perineal area   Body parts bathed by helper: Right arm, Left arm, Chest, Abdomen, Front perineal area, Right lower leg, Left upper leg, Right upper leg, Buttocks, Left lower leg, Face     Bathing assist Assist Level: Maximal Assistance - Patient 24 - 49%     Upper Body Dressing/Undressing Upper body dressing Upper body dressing/undressing activity did not occur (including orthotics): N/A What is the patient wearing?: Pull over shirt    Upper body assist Assist Level: Moderate Assistance - Patient 50 - 74%    Lower Body Dressing/Undressing Lower body dressing    Lower body dressing activity did not occur: N/A What is the patient wearing?: Incontinence brief, Pants     Lower body assist Assist for lower body dressing: Total Assistance - Patient < 25%     Toileting Toileting Toileting Activity did not occur Landscape architect and hygiene only): N/A (no void or bm)  Toileting assist Assist for toileting: Maximal Assistance - Patient 25 - 49%     Transfers Chair/bed transfer  Transfers assist  Chair/bed transfer activity did not occur: Safety/medical concerns  Chair/bed transfer assist level: Moderate Assistance - Patient 50 - 74%     Locomotion Ambulation   Ambulation assist   Ambulation activity did not occur: Safety/medical concerns  Assist  level: Maximal Assistance - Patient 25 - 49% Assistive device: Other (comment)(rail in hallway) Max distance: 15'   Walk 10 feet activity   Assist  Walk 10 feet activity did not occur: Safety/medical concerns  Assist level: Maximal Assistance - Patient 25 - 49% Assistive device: Other (comment)(rail in hallway)   Walk 50 feet  activity   Assist Walk 50 feet with 2 turns activity did not occur: Safety/medical concerns         Walk 150 feet activity   Assist Walk 150 feet activity did not occur: Safety/medical concerns         Walk 10 feet on uneven surface  activity   Assist Walk 10 feet on uneven surfaces activity did not occur: Safety/medical concerns         Wheelchair     Assist Will patient use wheelchair at discharge?: Yes Type of Wheelchair: Manual    Wheelchair assist level: Supervision/Verbal cueing Max wheelchair distance: 20'    Wheelchair 50 feet with 2 turns activity    Assist        Assist Level: Dependent - Patient 0%   Wheelchair 150 feet activity     Assist     Assist Level: Dependent - Patient 0%      Medical Problem List and Plan: 1.Functional and cognitive 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  Cont CIR  CT head performed on 4/20 showing slight improvement.  Please see PA note-discussed with neurology, repeat CT personally reviewed- continued improvement in hemorrhagic infarct, after PA discussion with neurology, ASA initiated.  Discussed again option of palliative care with family- educated on goals of palliative care-daughter remains reluctant at this point, states that she is going to discuss with other family members. 2. Antithrombotics: -PE/LLEDVT/anticoagulation:None due to [place].  PE resolved on most recent CTA. -antiplatelet therapy: ASA 3. Pain Management:tylenol prn. 4. Mood:LCSW to follow for evaluation and support. antipsychotic agents: N/A.   Ativan tid PTA, increased to 0.75 TID on 4/21  Prozac. 5. Neuropsych: This patientis not fullycapable of making decisions onhisown behalf. 6. Skin/Wound Care:Monitor right neck hematoma for resolution. 7. Fluids/Electrolytes/Nutrition:Monitor I/O.  8.ABLA: Continue to monitor H/H/Platelets with serial checks. Monitor for signs of  bleeding.   Hemoglobin 12.2 on 4/30, labs ordered for Monday  Continue to monitor 9. Right forearm wound/ recent cellulitis:Completed course of Keflex on 4/16. Added Aqacell to wound as per derm records. 10.CKD stage III: Avoid nephrotoxic medications. Monitor with serial checks.   Creatinine 1.55 on 4/30, labs ordered for Monday  Encourage fluids  Continue to monitor 11. H/o A fib: Monitor HR bid. Continue metoprolol bid--off Lovenox.  12. H/o Depression: On Prozac daily with ativan tid.  57. H/o Lung cancer: Had PE on Eliquis. No antiplatelets or anticoagulation for now.   CTA on 4/18- PE resolved, progression of right hilum abnormality, chronic radiation scarring  Off  steroids  Added Mucinex dm to help with cough/clearance  -encourage IS/FV  -pulmonary toilet   Continue supplemental oxygen  Chest x-ray with some fluid, BNP elevated  Lasix 20 started on 5/1  Patient was seen by Pulm-no new recommendations. 14. Labile blood pressure-    Vitals:   08/04/18 0615 08/04/18 1005  BP: 98/67 (!) 113/53  Pulse: 70 71  Resp:    Temp: 98.3 F (36.8 C)   SpO2: 97%    Labile on 5/2 15.  Steroid-induced hyperglycemia  Off steroids now, remains elevated 16. Leukocytosis:    WBCs 15.0 on 4/30, labs ordered for  Monday  Afebrile  CXR reviewed, unremarkable for infection 17.  Hyponatremia  Sodium 132 on 4/30, labs ordered for Monday  LOS: 19 days A FACE TO FACE EVALUATION WAS PERFORMED  Rella Egelston Lorie Phenix 08/04/2018, 12:54 PM

## 2018-08-04 NOTE — Progress Notes (Signed)
Physical Therapy Session Note  Patient Details  Name: Evan [name] MRN: [id number] Date of Birth: January 22, 1928  Today's Date: 08/04/2018 PT Individual Time: 1300-1405 PT Individual Time Calculation (min): 65 min   Short Term Goals: Week 3:  PT Short Term Goal 1 (Week 3): Pt will transfer bed<>chair w/ mod assist consistently PT Short Term Goal 2 (Week 3): Pt will ambulate 15' w/ mod assist using LRAD PT Short Term Goal 3 (Week 3): Pt will maintain static standing balance w/ min assist  PT Short Term Goal 4 (Week 3): Pt will self-propel w/c 50' w/ min assist   Skilled Therapeutic Interventions/Progress Updates:    Patient in TIS w/c RN reports needs to eat lunch.  Maintained in TIS, but tilted forward for lunch.  Pt assisted with self feeding about 10% of session, but too difficult due to focus and max cues it takes to eat and keep sweeping R side of mouth due to significant pocketing.  Patient coughed x 2 on liquids taken via straw.  Performed oral care after eating at sink with max A.  Patient sit to stand min A and transferred to L to bed stand pivot mod A bracing R knee for safety.  Patient seated EOB x 10 min with S mod cues for leaning to R and A to reposition R foot several times.  While seated performed 10 reps on flutter valve with cues for technique.  Patient looking over R shoulder with LOB posterior to R min A to recover.  Sit to supine mod A and pt requesting bed pan.  Rolled to R min A for bedpan placement.  Patient incontinent of urine while rolling.  No significant BM, but rolled for hygiene again with urinary incontinence.  Rolling both ways to complete donning new brief and changing bed pad.  Scooted up in bed max A and set up in chair position in bed for optimal respiratory efficiency.  Left with call bell in reach, bed alarm activated.    Therapy Documentation Precautions:  Precautions Precautions: Fall Precaution Comments: bruising along R side of neck Restrictions Weight  Bearing Restrictions: No Pain: Pain Assessment Pain Score: 0-No pain    Therapy/Group: Individual Therapy  Reginia Naas  Magda Kiel, PT 08/04/2018, 4:26 PM

## 2018-08-05 ENCOUNTER — Inpatient Hospital Stay (HOSPITAL_COMMUNITY): Payer: Medicare HMO | Admitting: Occupational Therapy

## 2018-08-05 ENCOUNTER — Inpatient Hospital Stay (HOSPITAL_COMMUNITY): Payer: Medicare HMO

## 2018-08-05 NOTE — Progress Notes (Addendum)
Physical Therapy Session Note  Patient Details  Name: Evan [name] MRN: [id number] Date of Birth: November 17, 1927  Today's Date: 08/05/2018 PT Individual Time: 0800-0900 PT Individual Time Calculation (min): 60 min   Short Term Goals:  Week 3:  PT Short Term Goal 1 (Week 3): Pt will transfer bed<>chair w/ mod assist consistently PT Short Term Goal 2 (Week 3): Pt will ambulate 15' w/ mod assist using LRAD PT Short Term Goal 3 (Week 3): Pt will maintain static standing balance w/ min assist  PT Short Term Goal 4 (Week 3): Pt will self-propel w/c 50' w/ min assist      Skilled Therapeutic Interventions/Progress Updates:   Pt resting in bed.  Bed mobility for rolling with visual cues, CG assist, using bed features.  PT donned pants, and pt used L hand to assist minimally in pulling them over hips. Sit>< supine with min assist using bed features.  Pt sat EOB x 15 minutes , bil feet on floor, without LOB, including talking on phone to dtr and son in separate phone calls.  Pt stated that he needed to have BM. After assisting pt back into supine, he stated that he didn't need to go.  Pt had applesauce escaping out of R side of mouth, x 3 during sitting.  Pt coughed and cleared his throat.  He drank water using a straw without difficulty.  When attempting to use flutter valve, more applesauce escaped.  PT informed RN.   neuromuscular re-education via multimodal cues for R lateral leans>< midline x 5, and bil bridging with RLE assist, for hygiene and assisted scoot to [place], x 5.  Sit> stand with mod assist for RLe stability.  Upon standing, pt had bowel accident.  With pt in supine, PT doffed diaper, performed peri care for BM, donned clean diaper.  With set up, pt performed peri care after incontinent voiding as he rolled in bed. Small amount of urine voided into urinal, placed there by PT.  Pt left resting in chair position of bed, alarm set and needs at hand.  O2 sats stayed >95% on 2L O2 via [place],  throughout session.  DOE, but slightly improved with cues for breathing slower.      Therapy Documentation Precautions:  Precautions Precautions: Fall Precaution Comments: bruising along R side of neck Restrictions Weight Bearing Restrictions: No       Therapy/Group: Individual Therapy  Callie Facey 08/05/2018, 10:25 AM

## 2018-08-05 NOTE — Progress Notes (Signed)
Occupational Therapy Session Note  Patient Details  Name: Evan [name] MRN: [id number] Date of Birth: 1927/07/31  Today's Date: 08/05/2018 OT Individual Time: 1300-1415 OT Individual Time Calculation (min): 75 min   Skilled Therapeutic Interventions/Progress Updates:    Pt greeted in bed, in the middle of lunch with NT. Placed pt in chair position to resume eating. He was able to scoop food onto spoon using Lt hand with vcs for small bites. OT facilitated use of R UE as gross stabilizer when consuming applesauce, beverages, and magic cup. Pt required vcs for pocketing and slowing eating rate. There was considerable spillage from Rt side of mouth. Moderate coughing noted when taking small sips of beverages. This also occurred when RN was giving him medicine during session. 02 sats on 2L 98% at rest after. He completed oral care using suction toothbrush and handwashing using sanitizer with supervision. Active proximal movement of R UE noted at this time. He declined toileting or participation in LB dressing tasks. NT stated she had helped him with perihygiene prior to OT arrival. He was agreeable to wash his face at the sink. Mod A for supine<sit with HOB elevated, and Min A for sit<stand in [place]. He required verbal and tactile cues to keep Rt foot on St[place] platform, and was able to readjust R LE while standing with Max A for balance assist. Rt lean/pushing noted while in perched position. He was able to maintain Rt grasp on Stedy bar with cuing. Post transfer 02 sats 98-99%. While upright in TIS, he completed face washing. Pt required vcs and Mod A for motor planning and incorporating R UE during task. He declined to wash hair with shower cap or brush hair. He completed Stedy transfer back to bed in manner as written above, Max A for sit<supine. Pt was placed in chair position once again. Hemiplegic side supported and lateral prop added to minimize Rt lean in bed. 02 sats 98-99%. He was left with all needs  within reach and bed alarm set. Tx focus placed on self care retraining, activity tolerance, sit<stands, Rt attention, and Rt NMR.   Therapy Documentation Precautions:  Precautions Precautions: Fall Precaution Comments: bruising along R side of neck Restrictions Weight Bearing Restrictions: No Vital Signs: Therapy Vitals Temp: (!) 97.5 F (36.4 C) Pulse Rate: 72 Resp: 20 BP: 117/69 Patient Position (if appropriate): Sitting Oxygen Therapy SpO2: 95 % O2 Device: Nasal Cannula Pain: Pt denied having pain during tx    ADL: ADL Eating: Minimal assistance Grooming: Maximal assistance Upper Body Bathing: Dependent Lower Body Bathing: Dependent Upper Body Dressing: Maximal assistance Lower Body Dressing: Dependent Toileting: Dependent Toilet Transfer: Unable to assess Tub/Shower Transfer: Unable to assess     Therapy/Group: Individual Therapy  Kharisma Glasner A Teliyah Royal 08/05/2018, 3:56 PM

## 2018-08-05 NOTE — Progress Notes (Signed)
[place] PHYSICAL MEDICINE & REHABILITATION PROGRESS NOTE  Subjective/Complaints: Patient seen laying in bed this morning.  He states he did not sleep well at all overnight.  He slept fairly well per sleep chart.  ROS: Limited due to [facility], [place], but appears to deny complaints of shortness of breath, chest pain, N/V/D.  Objective: Vital Signs: Blood pressure 117/73, pulse 67, temperature 98 F (36.7 C), temperature source Oral, resp. rate 19, height 5\' 5"  (1.651 m), weight 74.5 kg, SpO2 100 %. No results found. No results for input(s): WBC, HGB, HCT, PLT in the last 72 hours. No results for input(s): NA, K, CL, CO2, GLUCOSE, BUN, CREATININE, CALCIUM in the last 72 hours.  Physical Exam: BP 117/73 (BP Location: Left Arm)   Pulse 67   Temp 98 F (36.7 C) (Oral)   Resp 19   Ht 5\' 5"  (1.651 m)   Wt 74.5 kg   SpO2 100%   BMI 27.33 kg/m  Constitutional: NAD. Vital signs reviewed. HENT: Cephalic.  Atraumatic. Eyes: EOMI.  No discharge. Cardiovascular: No JVD. Respiratory: Normal effort.  + [place]. GI: Non-distended. Musc: No edema or tenderness in extremities. Neurological:  He alert. Extremely HOH.  Motor:  RUE: Shoulder abduction 4/5, elbow flexion/extension 4/5, handgrip 4/5, stable Right lower extremity: Hip flexion, knee extension 4--4/5, ankle dorsiflexion 4-/4/5, stable Skin: Right neck with ecchymosis and hard indurated area under chin, improving.  Psychiatric: Anxious   Assessment/Plan: 1. 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  Physiatrist is providing close team supervision and 24 hour management of active medical problems listed below.  Physiatrist and rehab team continue to assess barriers to discharge/monitor patient progress toward functional and medical goals  Care Tool:  Bathing    Body parts bathed by patient: Left arm, Chest, Abdomen,  Front perineal area   Body parts bathed by helper: Right arm, Left arm, Chest, Abdomen, Front perineal area, Right lower leg, Left upper leg, Right upper leg, Buttocks, Left lower leg, Face     Bathing assist Assist Level: Maximal Assistance - Patient 24 - 49%     Upper Body Dressing/Undressing Upper body dressing Upper body dressing/undressing activity did not occur (including orthotics): N/A What is the patient wearing?: Pull over shirt    Upper body assist Assist Level: Moderate Assistance - Patient 50 - 74%    Lower Body Dressing/Undressing Lower body dressing    Lower body dressing activity did not occur: N/A What is the patient wearing?: Incontinence brief, Pants     Lower body assist Assist for lower body dressing: Total Assistance - Patient < 25%     Toileting Toileting Toileting Activity did not occur Landscape architect and hygiene only): N/A (no void or bm)  Toileting assist Assist for toileting: Maximal Assistance - Patient 25 - 49%     Transfers Chair/bed transfer  Transfers assist  Chair/bed transfer activity did not occur: Safety/medical concerns  Chair/bed transfer assist level: Moderate Assistance - Patient 50 - 74%     Locomotion Ambulation   Ambulation assist   Ambulation activity did not occur: Safety/medical concerns  Assist level: Maximal Assistance - Patient 25 - 49% Assistive device: Other (comment)(rail in hallway) Max distance: 15'   Walk 10 feet activity   Assist  Walk 10 feet activity did not occur: Safety/medical concerns  Assist level: Maximal Assistance - Patient 25 - 49% Assistive device: Other (comment)(rail in hallway)   Walk  50 feet activity   Assist Walk 50 feet with 2 turns activity did not occur: Safety/medical concerns         Walk 150 feet activity   Assist Walk 150 feet activity did not occur: Safety/medical concerns         Walk 10 feet on uneven surface  activity   Assist Walk 10 feet on  uneven surfaces activity did not occur: Safety/medical concerns         Wheelchair     Assist Will patient use wheelchair at discharge?: Yes Type of Wheelchair: Manual    Wheelchair assist level: Supervision/Verbal cueing Max wheelchair distance: 20'    Wheelchair 50 feet with 2 turns activity    Assist        Assist Level: Dependent - Patient 0%   Wheelchair 150 feet activity     Assist     Assist Level: Dependent - Patient 0%      Medical Problem List and Plan: 1.Functional and cognitive 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  Cont CIR  CT head performed on 4/20 showing slight improvement.  Please see PA note-discussed with neurology, repeat CT personally reviewed- continued improvement in hemorrhagic infarct, after PA discussion with neurology, ASA initiated.  Discussed again option of palliative care with family- educated on goals of palliative care-daughter remains reluctant at this point, states that she is going to discuss with other family members.  Awaiting decision. 2. Antithrombotics: -PE/LLEDVT/anticoagulation:None due to [place].  PE resolved on most recent CTA. -antiplatelet therapy: ASA 3. Pain Management:tylenol prn. 4. Mood:LCSW to follow for evaluation and support. antipsychotic agents: N/A.   Ativan tid PTA, increased to 0.75 TID on 4/21  Prozac. 5. Neuropsych: This patientis not fullycapable of making decisions onhisown behalf. 6. Skin/Wound Care:Monitor right neck hematoma for resolution. 7. Fluids/Electrolytes/Nutrition:Monitor I/O.  8.ABLA: Continue to monitor H/H/Platelets with serial checks. Monitor for signs of bleeding.   Hemoglobin 12.2 on 4/30, labs ordered for tomorrow  Continue to monitor 9. Right forearm wound/ recent cellulitis:Completed course of Keflex on 4/16. Added Aqacell to wound as per derm records. 10.CKD stage III: Avoid nephrotoxic medications. Monitor with  serial checks.   Creatinine 1.55 on 4/30, labs ordered for tomorrow  Encourage fluids  Continue to monitor 11. H/o A fib: Monitor HR bid. Continue metoprolol bid--off Lovenox.  12. H/o Depression: On Prozac daily with ativan tid.  17. H/o Lung cancer: Had PE on Eliquis. No antiplatelets or anticoagulation for now.   CTA on 4/18- PE resolved, progression of right hilum abnormality, chronic radiation scarring  Off  steroids  Added Mucinex dm to help with cough/clearance  -encourage IS/FV  -pulmonary toilet   Continue supplemental oxygen  Chest x-ray with some fluid, BNP elevated  Lasix 20 started on 5/1  Patient was seen by Pulm-no new recommendations. 14. Labile blood pressure-    Vitals:   08/05/18 0342 08/05/18 0432  BP: 128/62 117/73  Pulse: 64 67  Resp: (!) 24 19  Temp: 98.5 F (36.9 C) 98 F (36.7 C)  SpO2: 100% 100%   Labile on 5/3 15.  Steroid-induced hyperglycemia  Off steroids now, remains elevated 16. Leukocytosis:    WBCs 15.0 on 4/30, labs ordered for tomorrow  Afebrile  CXR reviewed, unremarkable for infection 17.  Hyponatremia  Sodium 132 on 4/30, labs ordered for tomorrow  LOS: 20 days A FACE TO [place] 08/05/2018, 12:28 PM

## 2018-08-05 NOTE — Plan of Care (Signed)
  Problem: RH BLADDER ELIMINATION Goal: RH STG MANAGE BLADDER WITH ASSISTANCE Description STG Manage Bladder With Mod Assistance   Outcome: Not Progressing; incontinence

## 2018-08-06 ENCOUNTER — Inpatient Hospital Stay (HOSPITAL_COMMUNITY): Payer: Self-pay

## 2018-08-06 ENCOUNTER — Inpatient Hospital Stay (HOSPITAL_COMMUNITY): Payer: Medicare HMO | Admitting: Occupational Therapy

## 2018-08-06 ENCOUNTER — Inpatient Hospital Stay (HOSPITAL_COMMUNITY): Payer: Medicare HMO

## 2018-08-06 LAB — BASIC METABOLIC PANEL
Anion gap: 12 (ref 5–15)
BUN: 25 mg/dL — ABNORMAL HIGH (ref 8–23)
CO2: 20 mmol/L — ABNORMAL LOW (ref 22–32)
Calcium: 8.7 mg/dL — ABNORMAL LOW (ref 8.9–10.3)
Chloride: 101 mmol/L (ref 98–111)
Creatinine, Ser: 1.27 mg/dL — ABNORMAL HIGH (ref 0.61–1.24)
GFR calc Af Amer: 58 mL/min — ABNORMAL LOW (ref >60)
GFR calc non Af Amer: 50 mL/min — ABNORMAL LOW (ref >60)
Glucose, Bld: 121 mg/dL — ABNORMAL HIGH (ref 70–99)
Potassium: 3.9 mmol/L (ref 3.5–5.1)
Sodium: 133 mmol/L — ABNORMAL LOW (ref 135–145)

## 2018-08-06 LAB — CBC WITH DIFFERENTIAL/PLATELET
Abs Immature Granulocytes: 0.07 10*3/uL (ref 0.00–0.07)
Basophils Absolute: 0 10*3/uL (ref 0.0–0.1)
Basophils Relative: 0 %
Eosinophils Absolute: 0 10*3/uL (ref 0.0–0.5)
Eosinophils Relative: 1 %
HCT: 33.4 % — ABNORMAL LOW (ref 39.0–52.0)
Hemoglobin: 11.1 g/dL — ABNORMAL LOW (ref 13.0–17.0)
Immature Granulocytes: 1 %
Lymphocytes Relative: 12 %
Lymphs Abs: 1 10*3/uL (ref 0.7–4.0)
MCH: 31.2 pg (ref 26.0–34.0)
MCHC: 33.2 g/dL (ref 30.0–36.0)
MCV: 93.8 fL (ref 80.0–100.0)
Monocytes Absolute: 1.1 10*3/uL — ABNORMAL HIGH (ref 0.1–1.0)
Monocytes Relative: 13 %
Neutro Abs: 6.1 10*3/uL (ref 1.7–7.7)
Neutrophils Relative %: 73 %
Platelets: 131 10*3/uL — ABNORMAL LOW (ref 150–400)
RBC: 3.56 MIL/uL — ABNORMAL LOW (ref 4.22–5.81)
RDW: 13.6 % (ref 11.5–15.5)
WBC: 8.3 10*3/uL (ref 4.0–10.5)
nRBC: 0 % (ref 0.0–0.2)

## 2018-08-06 MED ORDER — FLUOXETINE HCL 20 MG PO CAPS
40.0000 mg | ORAL_CAPSULE | Freq: Every day | ORAL | Status: DC
  Administered 2018-08-07 – 2018-08-09 (×3): 40 mg via ORAL
  Administered 2018-08-10: 20 mg via ORAL
  Filled 2018-08-06 (×4): qty 2

## 2018-08-06 MED ORDER — FLUOXETINE HCL 20 MG PO CAPS
20.0000 mg | ORAL_CAPSULE | Freq: Once | ORAL | Status: AC
  Administered 2018-08-06: 20 mg via ORAL
  Filled 2018-08-06: qty 1

## 2018-08-06 NOTE — Progress Notes (Signed)
Physical Therapy Session Note  Patient Details  Name: Evan [name] MRN: [id number] Date of Birth: Sep 25, 1927  Today's Date: 08/06/2018 PT Individual Time: 1130-1230 PT Individual Time Calculation (min): 60 min   Short Term Goals: Week 3:  PT Short Term Goal 1 (Week 3): Pt will transfer bed<>chair w/ mod assist consistently PT Short Term Goal 2 (Week 3): Pt will ambulate 15' w/ mod assist using LRAD PT Short Term Goal 3 (Week 3): Pt will maintain static standing balance w/ min assist  PT Short Term Goal 4 (Week 3): Pt will self-propel w/c 50' w/ min assist   Skilled Therapeutic Interventions/Progress Updates:    Patient reclined in TIS w/c.  Questions when he will get to go and see what he can do.  Pushed in w/c to therapy gym.  Transferred squat pivot to mat to L.  Sitting balance close S while PT obtaining items for working on balance.  Patient seated EOM with CGA to perform reaching forward and to L to obtain horseshoes from rim of basketball goal and toss onto horseshoe staub with cues and demonstration for anterior leaning.  Initially attempted to reach horseshoes with sit to stand x 2 prior to pt c/o pain R foot and noted area of errythema on dorsum of foot at MTP joints.  RN made aware end of session.  Patient assisted to TIS w/c mod A squat pivot to L and max cues for scooting back in chair.  Assisted to room in w/c and transferred to L to bed via squat pivot.  Scooting to HOB min A with bed rail x 3 scoots.  Patient sit to supine with min A R LE placement.  Patient positioned for optimal airflow with HOB 45 degrees, L arm supported and knees flexed.  Call bell in reach and bed alarm activated.  Therapy Documentation Precautions:  Precautions Precautions: Fall Precaution Comments: bruising along R side of neck Restrictions Weight Bearing Restrictions: No Pain: Pain Assessment Faces Pain Scale: Hurts even more Pain Type: Acute pain Pain Location: Foot Pain Orientation: Left Pain  Descriptors / Indicators: Sore Pain Onset: With Activity Pain Intervention(s): Repositioned;RN made aware    Therapy/Group: Individual Therapy  [place], PT 08/06/2018, 11:56 AM

## 2018-08-06 NOTE — Progress Notes (Signed)
Speech Language Pathology Daily Session Note  Patient Details  Name: Evan [name] MRN: [id number] Date of Birth: 1927-08-09  Today's Date: 08/06/2018 SLP Individual Time: 1499-6924 SLP Individual Time Calculation (min): 42 min  Short Term Goals: Week 3: SLP Short Term Goal 1 (Week 3): Pt will demonstrate orientation x4 with mod verbal cues. SLP Short Term Goal 2 (Week 3): Pt will complete basic problem solving tasks with min assist given verbal cues. SLP Short Term Goal 3 (Week 3): Patient will consume current diet with minimal overt s/s of aspiration and overall Min A verbal cues for use of swallowing compensatory strategies.   Skilled Therapeutic Interventions:Skilled ST services focused on swallow and cognitive skills. SLP facilitated PO consumption of dys 2 and thin via cup/straw (breakfast tray), following repositioning in bed. Pt required mod A verbal cues to clear right side pocketing, demonstrated mild anterior spillage and max A verbal cues to consume small sips resulting in coughing x3, however given very controled sips no coughing noted. Pt demonstrated basic problem solving navigating tray with min A verbal cues and assisted due to RUE weakness. SLP facilitated orientation, pt was orientated to place and required total A for situation and time with visual aid posted in room. Pt was left in room with call bell within reach, nurse tech in room with instructed to control liquid bolus size, complete oral care and bed alarm set. ST recommends to continue skilled ST services.   As of note, OT mentioned pt coughing over the weekend in OT session and with nursing. SLP will continue to assess swallow function for safest and least restrictive diet.      Pain Pain Assessment Pain Score: 0-No pain Faces Pain Scale: Hurts even more Pain Type: Acute pain Pain Location: Foot Pain Orientation: Left Pain Descriptors / Indicators: Sore Pain Onset: With Activity Pain Intervention(s):  Repositioned;RN made aware  Therapy/Group: Individual Therapy  Ainslee Sou  [facility] 08/06/2018, 12:48 PM

## 2018-08-06 NOTE — Progress Notes (Signed)
Occupational Therapy Session Note  Patient Details  Name: Evan [name] MRN: [id number] Date of Birth: October 28, 1927  Today's Date: 08/06/2018 OT Individual Time: 9758-8325 OT Individual Time Calculation (min): 69 min    Short Term Goals: Week 3:  OT Short Term Goal 1 (Week 3): Pt will complete toilet transfer with mod A OT Short Term Goal 2 (Week 3): Pt will maintain standing balance for 1 minute with no more than mod A OT Short Term Goal 3 (Week 3): Pt will use R UE for 50% of bathing tasks with mod verbal cues  Skilled Therapeutic Interventions/Progress Updates:    Pt greeted semi-reclined in bed and agreeable to OT treatment session. Pt on 2L of O2 throughout session with SpO2 maintained at 93% and above. Pt came to sitting EOB with mod A and increased time. OT assistance to initiate moving R side. Worked on sitting balance at EOB with pt needed verbal cues to lean to the L, but overall min/CGA. OT brought wide drop arm BSC out beside bed and completed squat-pivot transfer to the stronger L side with max A and difficulty powering up today. Pt reports his L LE is sore at the bottom of his foot. Pt's foot noted to have some redness, but no sore noted-RN notified. Pt unable to complete lateral leans to remove brief 2/2 pushing to the R and poor motor planning/body awareness. STedy used for sit<>stand so OT could remove pants. Max A to come to standing in [place]. Pt sat on commode for extended time, but was unable to void continently. Upon standing for peri-care, pt incontinent of BM and bladder and unaware. Retruned pt to sitting on commode but he could not attend to task long enough to void more. Pt stood in stedy with Max A while OT provided peri-care and brief change total A. Pt with increased Dravyn Severs after standing, needing extended rest break prior to donning pants. Total A to don pants with another max A sit<>stand in [place]. Worked on R UE NMR with shampoo cap and scalp scrub. PT needed hand over hand A  to integrate R UE. PT reported "I wet myslef and made a stink." Had nurse tech come assist OT as +2. Max A sit<>stand in stedy and max A for standing balance. Pt with more bowel and bladder incontinence standing without brief on. Nurse tech assisted with total A peri-care and brief change. Pt left up in TIS wc with safety belt on and in care of nursing staff.   Therapy Documentation Precautions:  Precautions Precautions: Fall Precaution Comments: bruising along R side of neck Restrictions Weight Bearing Restrictions: No   Therapy/Group: Individual Therapy  Valma Cava 08/06/2018, 10:45 AM

## 2018-08-06 NOTE — Progress Notes (Signed)
[facility] PHYSICAL MEDICINE & REHABILITATION PROGRESS NOTE  Subjective/Complaints: Patient seen laying in the bed this morning.  He slept fairly well overnight per sleep chart.  Patient states he is not doing well.  He states he needs more medications.  Discussed with nursing, anxiolytics have not been given yet.  ROS: Limited due to [facility], but appears to complain of shortness of breath, denies chest pain, N/V/D.  Objective: Vital Signs: Blood pressure 111/64, pulse 87, temperature 98 F (36.7 C), temperature source Oral, resp. rate 14, height 5\' 5"  (1.651 m), weight 74 kg, SpO2 97 %. No results found. No results for input(s): WBC, HGB, HCT, PLT in the last 72 hours. Recent Labs    08/06/18 0644  NA 133*  K 3.9  CL 101  CO2 20*  GLUCOSE 121*  BUN 25*  CREATININE 1.27*  CALCIUM 8.7*    Physical Exam: BP 111/64 (BP Location: Left Arm)   Pulse 87   Temp 98 F (36.7 C) (Oral)   Resp 14   Ht 5\' 5"  (1.651 m)   Wt 74 kg   SpO2 97%   BMI 27.15 kg/m  Constitutional: + Distressed. Vital signs reviewed. HENT: Normocephalic.  Atraumatic. Eyes: EOMI.  No discharge. Cardiovascular: No JVD. Respiratory: Increased work of breathing.  + [place]. GI: Non-distended. Musc: No edema or tenderness in extremities. Neurological:  He alert. Extremely HOH.  Motor:  RUE: Shoulder abduction 4/5, elbow flexion/extension 4/5, handgrip 4/5, unchanged Right lower extremity: Hip flexion, knee extension 4--4/5, ankle dorsiflexion 4-/4/5, unchanged Skin: Right neck with ecchymosis and hard indurated area under chin, improving.  Psychiatric: Very anxious   Assessment/Plan: 1. 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  Physiatrist is providing close team supervision and 24 hour management of active medical problems listed below.  Physiatrist and rehab team continue to assess  barriers to discharge/monitor patient progress toward functional and medical goals  Care Tool:  Bathing    Body parts bathed by patient: Left arm, Chest, Abdomen, Front perineal area   Body parts bathed by helper: Right arm, Left arm, Chest, Abdomen, Front perineal area, Right lower leg, Left upper leg, Right upper leg, Buttocks, Left lower leg, Face     Bathing assist Assist Level: Maximal Assistance - Patient 24 - 49%     Upper Body Dressing/Undressing Upper body dressing Upper body dressing/undressing activity did not occur (including orthotics): N/A What is the patient wearing?: Pull over shirt    Upper body assist Assist Level: Moderate Assistance - Patient 50 - 74%    Lower Body Dressing/Undressing Lower body dressing    Lower body dressing activity did not occur: N/A What is the patient wearing?: Incontinence brief, Pants     Lower body assist Assist for lower body dressing: Total Assistance - Patient < 25%     Toileting Toileting Toileting Activity did not occur Landscape architect and hygiene only): N/A (no void or bm)  Toileting assist Assist for toileting: Maximal Assistance - Patient 25 - 49%     Transfers Chair/bed transfer  Transfers assist  Chair/bed transfer activity did not occur: Safety/medical concerns  Chair/bed transfer assist level: Moderate Assistance - Patient 50 - 74%     Locomotion Ambulation   Ambulation assist   Ambulation activity did not occur: Safety/medical concerns  Assist level: Maximal Assistance - Patient 25 - 49% Assistive device: Other (comment)(rail in hallway) Max distance: 30'  Walk 10 feet activity   Assist  Walk 10 feet activity did not occur: Safety/medical concerns  Assist level: Maximal Assistance - Patient 25 - 49% Assistive device: Other (comment)(rail in hallway)   Walk 50 feet activity   Assist Walk 50 feet with 2 turns activity did not occur: Safety/medical concerns         Walk 150 feet  activity   Assist Walk 150 feet activity did not occur: Safety/medical concerns         Walk 10 feet on uneven surface  activity   Assist Walk 10 feet on uneven surfaces activity did not occur: Safety/medical concerns         Wheelchair     Assist Will patient use wheelchair at discharge?: Yes Type of Wheelchair: Manual    Wheelchair assist level: Supervision/Verbal cueing Max wheelchair distance: 20'    Wheelchair 50 feet with 2 turns activity    Assist        Assist Level: Dependent - Patient 0%   Wheelchair 150 feet activity     Assist     Assist Level: Dependent - Patient 0%      Medical Problem List and Plan: 1.Functional and cognitive 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  Cont CIR  CT head performed on 4/20 showing slight improvement.  Please see PA note-discussed with neurology, repeat CT personally reviewed- continued improvement in hemorrhagic infarct, after PA discussion with neurology, ASA initiated.  Discussed again option of palliative care with family- educated on goals of palliative care-daughter remains reluctant at this point, states that she is going to discuss with other family members.  Cont to await decision. 2. Antithrombotics: -PE/LLEDVT/anticoagulation:None due to [place].  PE resolved on most recent CTA. -antiplatelet therapy: ASA 3. Pain Management:tylenol prn. 4. Mood:LCSW to follow for evaluation and support. antipsychotic agents: N/A.   Ativan tid PTA, increased to 0.75 TID on 4/21  Prozac increased on 5/4. 5. Neuropsych: This patientis not fullycapable of making decisions onhisown behalf. 6. Skin/Wound Care:Monitor right neck hematoma for resolution. 7. Fluids/Electrolytes/Nutrition:Monitor I/O.  8.ABLA: Continue to monitor H/H/Platelets with serial checks. Monitor for signs of bleeding.   Hemoglobin 12.2 on 4/30, labs pending  Continue to monitor 9. Right  forearm wound/ recent cellulitis:Completed course of Keflex on 4/16. Added Aqacell to wound as per derm records. 10.CKD stage III: Avoid nephrotoxic medications. Monitor with serial checks.   Creatinine 1.27 on 5/4  Encourage fluids  Continue to monitor 11. H/o A fib: Monitor HR bid. Continue metoprolol bid--off Lovenox.  12. H/o Depression: On Prozac daily with ativan tid.  23. H/o Lung cancer: Had PE on Eliquis. No antiplatelets or anticoagulation for now.   CTA on 4/18- PE resolved, progression of right hilum abnormality, chronic radiation scarring  Off  steroids  Added Mucinex dm to help with cough/clearance  -encourage IS/FV  -pulmonary toilet   Continue supplemental oxygen  Chest x-ray with some fluid, BNP elevated  Lasix 20 started on 5/1  Patient was seen by Pulm-no new recommendations. 14. Labile blood pressure-    Vitals:   08/05/18 1932 08/06/18 0319  BP: 125/73 111/64  Pulse: 77 87  Resp: 16 14  Temp: 98.1 F (36.7 C) 98 F (36.7 C)  SpO2: 97% 97%   Relatively controlled on 5/4 15.  Steroid-induced hyperglycemia  Off steroids now, remains elevated 16. Leukocytosis:    WBCs 15.0 on 4/30, labs pending  Afebrile  CXR reviewed, unremarkable for infection 17.  Hyponatremia  Sodium 133 on  5/4  LOS: 21 days A FACE TO FACE EVALUATION WAS PERFORMED  Ankit Lorie Phenix 08/06/2018, 8:33 AM

## 2018-08-07 ENCOUNTER — Inpatient Hospital Stay (HOSPITAL_COMMUNITY): Payer: Medicare HMO | Admitting: Physical Therapy

## 2018-08-07 ENCOUNTER — Inpatient Hospital Stay (HOSPITAL_COMMUNITY): Payer: Medicare HMO | Admitting: Occupational Therapy

## 2018-08-07 ENCOUNTER — Inpatient Hospital Stay (HOSPITAL_COMMUNITY): Payer: Medicare HMO | Admitting: Speech Pathology

## 2018-08-07 NOTE — Progress Notes (Addendum)
Occupational Therapy Session Note  Patient Details  Name: Evan [name] MRN: [id number] Date of Birth: 12-08-1927  Today's Date: 08/07/2018 OT Individual Time: 9628-3662 OT Individual Time Calculation (min): 75 min   Short Term Goals: Week 3:  OT Short Term Goal 1 (Week 3): Pt will complete toilet transfer with mod A OT Short Term Goal 2 (Week 3): Pt will maintain standing balance for 1 minute with no more than mod A OT Short Term Goal 3 (Week 3): Pt will use R UE for 50% of bathing tasks with mod verbal cues  Skilled Therapeutic Interventions/Progress Updates:    Pt greeted semi-reclined in bed and agreeable to OT. Pt on 2 L of O2 throughout session. Pt incontinent of bladder, so worked on bed mobility rolling L and R with min A for peri-care and brief change. Clinical supervisor OT entered to assist with session. Pt came to sitting EOB with mod A. Worked on sitting balance at EOB for dressing tasks. Supervision-min A for sitting balance. Pt was able to weight shift and attempt to thread pant legs, but needed assistance. Worked on sit<>stands with chair placed in front of patient and OT supporting R LE and forcing translation and weight bearing, then pt able to initiate stand with min A. Multiple sit<>stands at EOB, then progressed to pivoting to the wc on stronger L side. With direct cues for hand placement and assist with R LE positioning, pt able to pivot over to TIS wc. Pt brought to therapy gym and sara + used with sling for ambulation. Pt unable to advance R LE and difficult positioning. Tried three muskateers walk in the hallway with much better success. Pt even able to advance R LE with assist from OT to keep it from adducting towards midline. 3rd person used for wc follow and O2 tank. Pt walked 30 feet. Pt out of breath after ambualtion, but SpO2 100 and HR 85 after rest. Unsure what vitals were during ambulation. Changed TIS wc out for more appropriate fit and returned pt to room. Pt requests  to lie down and competed stand-pivot back to bed on L side with mod A. Pt left semi-reclined in bed with needs met and bed alarm on.   Therapy Documentation Precautions:  Precautions Precautions: Fall Precaution Comments: bruising along R side of neck Restrictions Weight Bearing Restrictions: No Pain:  denies pain  Therapy/Group: Individual Therapy  Valma Cava 08/07/2018, 12:46 PM

## 2018-08-07 NOTE — Progress Notes (Signed)
Orthopedic Tech Progress Note Patient Details:  Evan [name] 19-Apr-1927 [id number] Called in order to [facility] Patient ID: [name], male   DOB: April 21, 1927, 85 y.o.   MRN: [id number]   [name] 08/07/2018, 10:51 AM

## 2018-08-07 NOTE — Progress Notes (Signed)
[facility] PHYSICAL MEDICINE & REHABILITATION PROGRESS NOTE  Subjective/Complaints: Patient seen sitting up in bed this AM.  He states he slept well overnight, which is the first time he has said that. He slept well per sleep chart as well.  He states he does not feel well, but cannot identify a cause.   ROS: Denies shortness of breath, denies chest pain, N/V/D.  Objective: Vital Signs: Blood pressure 110/82, pulse 70, temperature 98.9 F (37.2 C), resp. rate 16, height 5\' 5"  (1.651 m), weight 74 kg, SpO2 99 %. No results found. Recent Labs    08/06/18 0644  WBC 8.3  HGB 11.1*  HCT 33.4*  PLT 131*   Recent Labs    08/06/18 0644  NA 133*  K 3.9  CL 101  CO2 20*  GLUCOSE 121*  BUN 25*  CREATININE 1.27*  CALCIUM 8.7*    Physical Exam: BP 110/82 (BP Location: Left Arm)   Pulse 70   Temp 98.9 F (37.2 C)   Resp 16   Ht 5\' 5"  (1.651 m)   Wt 74 kg   SpO2 99%   BMI 27.15 kg/m  Constitutional: NAD. Vital signs reviewed. HENT: Normocephalic. Atraumatic. Eyes: EOMI. No discharge. Cardiovascular: No JVD. Respiratory: Normal effort.  +[place]. GI: Non-distended. Musc: No edema or tenderness in extremities. Neurological:  He alert. Extremely HOH.  Motor:  RUE: Shoulder abduction 4-/5, elbow flexion/extension 4-/5, handgrip 4-/5, variable Right lower extremity: Hip flexion, knee extension 4-/5, ankle dorsiflexion 4-/5, variable Skin: Right neck with ecchymosis and hard indurated area under chin, improving.  Psychiatric: Very anxious   Assessment/Plan: 1. 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  Physiatrist is providing close team supervision and 24 hour management of active medical problems listed below.  Physiatrist and rehab team continue to assess barriers to discharge/monitor patient progress toward functional and medical goals  Care  Tool:  Bathing    Body parts bathed by patient: Left arm, Chest, Abdomen, Front perineal area   Body parts bathed by helper: Right arm, Left arm, Chest, Abdomen, Front perineal area, Right lower leg, Left upper leg, Right upper leg, Buttocks, Left lower leg, Face     Bathing assist Assist Level: Maximal Assistance - Patient 24 - 49%     Upper Body Dressing/Undressing Upper body dressing Upper body dressing/undressing activity did not occur (including orthotics): N/A What is the patient wearing?: Pull over shirt    Upper body assist Assist Level: Moderate Assistance - Patient 50 - 74%    Lower Body Dressing/Undressing Lower body dressing    Lower body dressing activity did not occur: N/A What is the patient wearing?: Incontinence brief, Pants     Lower body assist Assist for lower body dressing: Total Assistance - Patient < 25%     Toileting Toileting Toileting Activity did not occur Landscape architect and hygiene only): N/A (no void or bm)  Toileting assist Assist for toileting: Maximal Assistance - Patient 25 - 49%     Transfers Chair/bed transfer  Transfers assist  Chair/bed transfer activity did not occur: Safety/medical concerns  Chair/bed transfer assist level: Moderate Assistance - Patient 50 - 74%     Locomotion Ambulation   Ambulation assist   Ambulation activity did not occur: Safety/medical concerns  Assist level: Maximal Assistance - Patient 25 - 49% Assistive device: Other (comment)(rail in hallway) Max distance: 15'   Walk 10 feet activity  Assist  Walk 10 feet activity did not occur: Safety/medical concerns  Assist level: Maximal Assistance - Patient 25 - 49% Assistive device: Other (comment)(rail in hallway)   Walk 50 feet activity   Assist Walk 50 feet with 2 turns activity did not occur: Safety/medical concerns         Walk 150 feet activity   Assist Walk 150 feet activity did not occur: Safety/medical concerns          Walk 10 feet on uneven surface  activity   Assist Walk 10 feet on uneven surfaces activity did not occur: Safety/medical concerns         Wheelchair     Assist Will patient use wheelchair at discharge?: Yes Type of Wheelchair: Manual    Wheelchair assist level: Supervision/Verbal cueing Max wheelchair distance: 20'    Wheelchair 50 feet with 2 turns activity    Assist        Assist Level: Dependent - Patient 0%   Wheelchair 150 feet activity     Assist     Assist Level: Dependent - Patient 0%      Medical Problem List and Plan: 1.Functional and cognitive 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  Cont CIR  CT head performed on 4/20 showing slight improvement.  Please see PA note-discussed with neurology, repeat CT personally reviewed- continued improvement in hemorrhagic infarct, after PA discussion with neurology, ASA initiated.  Discussed again option of palliative care with family- educated on goals of palliative care-daughter remains reluctant at this point, states that she is going to discuss with other family members.  Cont to await decision on 5/5. 2. Antithrombotics: -PE/LLEDVT/anticoagulation:None due to [place].  PE resolved on most recent CTA. -antiplatelet therapy: ASA 3. Pain Management:tylenol prn. 4. Mood:LCSW to follow for evaluation and support. antipsychotic agents: N/A.   Ativan tid PTA, increased to 0.75 TID on 4/21  Prozac increased on 5/4. 5. Neuropsych: This patientis not fullycapable of making decisions onhisown behalf. 6. Skin/Wound Care:Monitor right neck hematoma for resolution. 7. Fluids/Electrolytes/Nutrition:Monitor I/O.  8.ABLA: Continue to monitor H/H/Platelets with serial checks. Monitor for signs of bleeding.   Hemoglobin 11.1 on 5/4  Continue to monitor 9. Right forearm wound/ recent cellulitis:Completed course of Keflex on 4/16. Added Aqacell to wound as per derm  records. 10.CKD stage III: Avoid nephrotoxic medications. Monitor with serial checks.   Creatinine 1.27 on 5/4  Encourage fluids  Continue to monitor 11. H/o A fib: Monitor HR bid. Continue metoprolol bid--off Lovenox.  12. H/o Depression: On Prozac daily with ativan tid.  70. H/o Lung cancer: Had PE on Eliquis. No antiplatelets or anticoagulation for now.   CTA on 4/18- PE resolved, progression of right hilum abnormality, chronic radiation scarring  Off  steroids  Added Mucinex dm to help with cough/clearance  -encourage IS/FV  -pulmonary toilet   Continue supplemental oxygen  Chest x-ray with some fluid, BNP elevated  Lasix 20 started on 5/1  Patient was seen by Pulm-no new recommendations. 14. Labile blood pressure-    Vitals:   08/06/18 2000 08/07/18 0453  BP:  110/82  Pulse: 82 70  Resp:  16  Temp:  98.9 F (37.2 C)  SpO2: 92% 99%   Relatively controlled on 5/4 15.  Steroid-induced hyperglycemia  Off steroids now, remains elevated 16. Leukocytosis:  Resolved  WBCs 8.3 on 5/4  Afebrile  CXR reviewed, unremarkable for infection 17.  Hyponatremia  Sodium 133 on 5/4 18. Thrombocytopenia  Plts 131 on 5/4, labs ordered for  tomorrow  Cont to monitor  LOS: 22 days A FACE TO FACE EVALUATION WAS PERFORMED  Jadia Capers Lorie Phenix 08/07/2018, 9:09 AM

## 2018-08-07 NOTE — Progress Notes (Signed)
Physical Therapy Weekly Progress Note  Patient Details  Name: Evan [name] MRN: [id number] Date of Birth: 1928-02-04  Beginning of progress report period: July 31, 2018 End of progress report period: Aug 07, 2018  Today's Date: 08/07/2018 PT Individual Time: 7893-8101 PT Individual Time Calculation (min): 25 min   Patient has met 2 of 4 short term goals. Pt has made minimal progress towards LTGs over last week 2/2 increased SOB, increased supplemental O2 requirement, and intermittent bouts of fatigue/lethargy. He continues to require min-mod assist for bed mobility, mod assist for stand pivot transfers, and +2 assist for gait in short bouts.   Patient continues to demonstrate the following deficits muscle weakness, decreased cardiorespiratoy endurance and decreased oxygen support, impaired timing and sequencing, unbalanced muscle activation, motor apraxia, decreased coordination and decreased motor planning, decreased midline orientation and decreased attention to right, decreased initiation, decreased attention, decreased awareness, decreased problem solving, decreased safety awareness, decreased memory and delayed processing and decreased sitting balance, decreased standing balance, decreased postural control, hemiplegia and decreased balance strategies and therefore will continue to benefit from skilled PT intervention to increase functional independence with mobility.  Patient making limited progress toward long term goals.  See goal revision..  Plan of care revisions: gait goal downgraded to mod assist. Most LTGs remain appropriate at this time. Per MD and SW, family has not decided on having palliative care consult or not. Regardless, anticipate that pt will need extensive hands-on care upon discharge 2/2 incontinence, fluctuations in SOB/supplemental O2 requirements, and ongoing neuromotor/cognitive deficits requiring skilled cues for safe mobility.  PT Short Term Goals Week 3:  PT Short Term  Goal 1 (Week 3): Pt will transfer bed<>chair w/ mod assist consistently PT Short Term Goal 1 - Progress (Week 3): Met PT Short Term Goal 2 (Week 3): Pt will ambulate 15' w/ mod assist using LRAD PT Short Term Goal 2 - Progress (Week 3): Progressing toward goal PT Short Term Goal 3 (Week 3): Pt will maintain static standing balance w/ min assist  PT Short Term Goal 3 - Progress (Week 3): Met PT Short Term Goal 4 (Week 3): Pt will self-propel w/c 50' w/ min assist  PT Short Term Goal 4 - Progress (Week 3): Not met Week 4:  PT Short Term Goal 1 (Week 4): =LTGs due to ELOS  Skilled Therapeutic Interventions/Progress Updates:   Pt in supine and agreeable to therapy, no c/o pain. Supine to sit w/ min assist. Worked on sit<>stands from EOB w/o AD in preparation for stand pivot transfers. Performed 4-5 reps w/ various hand positioning and techniques. Ultimately needing only min assist to stand, but max assist to maintain static balance w/o UE support. Min-mod assist to maintain RLE firmly planted on floor and tactile and verbal cues for upright posture. With bed rail or armrest, pt able to maintain static stance w/ min assist. Stand pivot transfer towards L side w/ min assist. Ended session tilted in TIS to work on OOB/upright tolerance, all needs met and in reach.  Therapy Documentation Precautions:  Precautions Precautions: Fall Precaution Comments: bruising along R side of neck Restrictions Weight Bearing Restrictions: No Vital Signs: Therapy Vitals Temp: (!) 97.3 F (36.3 C) Temp Source: Oral Pulse Rate: 67 Resp: (!) 24 BP: 122/65 Patient Position (if appropriate): Lying Oxygen Therapy SpO2: 100 % O2 Device: Nasal Cannula  Therapy/Group: Individual Therapy  Kaela Beitz Clent Demark 08/07/2018, 3:41 PM

## 2018-08-07 NOTE — Progress Notes (Signed)
Speech Language Pathology Daily Session Note  Patient Details  Name: Evan [name] MRN: [id number] Date of Birth: [birth year]  Today's Date: 08/07/2018 SLP Individual Time: 0725-0820 SLP Individual Time Calculation (min): 55 min  Short Term Goals: Week 3: SLP Short Term Goal 1 (Week 3): Pt will demonstrate orientation x4 with mod verbal cues. SLP Short Term Goal 2 (Week 3): Pt will complete basic problem solving tasks with min assist given verbal cues. SLP Short Term Goal 3 (Week 3): Patient will consume current diet with minimal overt s/s of aspiration and overall Min A verbal cues for use of swallowing compensatory strategies.   Skilled Therapeutic Interventions: Skilled treatment session focused on dysphagia and cognitive goals. SLP facilitated session by providing Min-Mod A verbal cues for use of swallowing compensatory strategies to maximize safety with breakfast meal of Dys. 2 textures with thin liquids. Patient with overt cough X 1 after large, sequential sips of thin liquids which was eliminated when patient utilized single sips. Recommend patient continue current diet. Patient appeared more alert this session with increased ability to follow commands/cues for use of swallowing strategies (clearing oral residue, single sips) and demonstrated selective attention to task for ~40 minutes with supervision verbal cues.  Patient requested to use the bathroom but was unable to void. Patient left upright in bed with alarm on and all needs within reach. Continue with current plan of care.      Pain No/Denies Pain   Therapy/Group: Individual Therapy  Jearldean Gutt 08/07/2018, 9:09 AM

## 2018-08-08 ENCOUNTER — Inpatient Hospital Stay (HOSPITAL_COMMUNITY): Payer: Medicare HMO | Admitting: Speech Pathology

## 2018-08-08 ENCOUNTER — Inpatient Hospital Stay (HOSPITAL_COMMUNITY): Payer: Medicare HMO

## 2018-08-08 ENCOUNTER — Inpatient Hospital Stay (HOSPITAL_COMMUNITY): Payer: Medicare HMO | Admitting: Physical Therapy

## 2018-08-08 LAB — CBC WITH DIFFERENTIAL/PLATELET
Abs Immature Granulocytes: 0.08 10*3/uL — ABNORMAL HIGH (ref 0.00–0.07)
Basophils Absolute: 0 10*3/uL (ref 0.0–0.1)
Basophils Relative: 0 %
Eosinophils Absolute: 0.1 10*3/uL (ref 0.0–0.5)
Eosinophils Relative: 1 %
HCT: 34 % — ABNORMAL LOW (ref 39.0–52.0)
Hemoglobin: 11.3 g/dL — ABNORMAL LOW (ref 13.0–17.0)
Immature Granulocytes: 1 %
Lymphocytes Relative: 19 %
Lymphs Abs: 1.5 10*3/uL (ref 0.7–4.0)
MCH: 30.6 pg (ref 26.0–34.0)
MCHC: 33.2 g/dL (ref 30.0–36.0)
MCV: 92.1 fL (ref 80.0–100.0)
Monocytes Absolute: 0.9 10*3/uL (ref 0.1–1.0)
Monocytes Relative: 12 %
Neutro Abs: 5.1 10*3/uL (ref 1.7–7.7)
Neutrophils Relative %: 67 %
Platelets: UNDETERMINED 10*3/uL (ref 150–400)
RBC: 3.69 MIL/uL — ABNORMAL LOW (ref 4.22–5.81)
RDW: 13.3 % (ref 11.5–15.5)
WBC: 7.7 10*3/uL (ref 4.0–10.5)
nRBC: 0 % (ref 0.0–0.2)

## 2018-08-08 LAB — BASIC METABOLIC PANEL
Anion gap: 11 (ref 5–15)
BUN: 25 mg/dL — ABNORMAL HIGH (ref 8–23)
CO2: 20 mmol/L — ABNORMAL LOW (ref 22–32)
Calcium: 8.8 mg/dL — ABNORMAL LOW (ref 8.9–10.3)
Chloride: 102 mmol/L (ref 98–111)
Creatinine, Ser: 1.3 mg/dL — ABNORMAL HIGH (ref 0.61–1.24)
GFR calc Af Amer: 56 mL/min — ABNORMAL LOW (ref >60)
GFR calc non Af Amer: 49 mL/min — ABNORMAL LOW (ref >60)
Glucose, Bld: 117 mg/dL — ABNORMAL HIGH (ref 70–99)
Potassium: 4.2 mmol/L (ref 3.5–5.1)
Sodium: 133 mmol/L — ABNORMAL LOW (ref 135–145)

## 2018-08-08 LAB — PLATELET COUNT: Platelets: 120 10*3/uL — ABNORMAL LOW (ref 150–400)

## 2018-08-08 NOTE — Patient Care Conference (Signed)
Inpatient RehabilitationTeam Conference and Plan of Care Update Date: 08/08/2018   Time: 2:35 PM    Patient Name: Evan [name]      Medical Record Number: [id number]  Date of Birth: Mar 17, 1928 Sex: Male         Room/Bed: 4M04C/4M04C-01 Payor Info: Payor: [employer] MEDICARE / Plan: [place] HMO / Product Type: *No Product type* /    Admitting Diagnosis: ich  Admit Date/Time:  07/16/2018  2:46 PM Admission Comments: No comment available   Primary Diagnosis:  <principal problem not specified> Principal Problem: <principal problem not specified>  Patient Active Problem List   Diagnosis Date Noted  . Supplemental oxygen dependent   . Anxiety state   . Fatigue   . Labile blood glucose   . Hyponatremia   . SOB (shortness of breath)   . Leukocytosis   . Hyperglycemia   . Labile blood pressure   . History of lung cancer   . Depression   . CKD (chronic kidney disease), stage III ([place])   . Acute blood loss anemia   . Fall 07/16/2018  . 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 Acute spontaneous 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 ([place]) L thalamic 07/14/2018  . Cellulitis of arm, right 06/01/2018  . Rash and nonspecific skin eruption 06/01/2018  . Chest pain 02/02/2018  . Thrombocytopenia ([place]) 02/02/2018  . Hypokalemia 02/02/2018  . Pulmonary embolism ([place]) 02/02/2018  . Elevated troponin 02/02/2018  . Pulmonary embolus ([place]) 02/02/2018  . Non-ST elevation MI (NSTEMI) ([place]) 06/27/2017  . Diastolic CHF ([place]) 31/54/0086  . Chest pain due to CAD ([place]) 06/26/2017  . Dyspnea 06/21/2017  . Acute bronchitis 05/15/2017  . Oral candidiasis 05/15/2017  . Acute pericarditis   . Chest pain at rest 12/20/2016  . Angina pectoris ([place]) 12/20/2016  . Radiation pneumonitis ([place]) 11/09/2016  . Pleurisy 10/11/2016  . Chest wall pain 10/11/2016  . Atrial fibrillation ([place]) 05/21/2016  . Adenocarcinoma of right lung  ([place]) 01/06/2016  . GAD (generalized anxiety disorder) 11/20/2015  . Essential hypertension   . Cellulitis 09/04/2015  . Cellulitis of left axilla   . Esophageal ulcer without bleeding   . Bilateral lower extremity edema 04/28/2014  . BPH (benign prostatic hyperplasia) 10/08/2012  . Anxiety   . EKG abnormalities 09/05/2012  . Tremor 09/05/2012  . Chronic fatigue 09/05/2012  . Arthritis   . Asthma, chronic   . Reflux esophagitis   . S/P CABG (coronary artery bypass graft)   . GERD (gastroesophageal reflux disease)   . Hyperlipidemia   . Hypertension   . UNSPECIFIED PERIPHERAL VASCULAR DISEASE 06/16/2009    Expected Discharge Date: Expected Discharge Date: 08/15/18  Team Members Present: Physician leading conference: Dr. Delice Lesch Social Worker Present: Lennart Pall, LCSW Nurse Present: Frances Maywood, RN PT Present: Burnard Bunting, PT OT Present: Mariane Masters, OT SLP Present: Weston Anna, SLP PPS Coordinator present : Gunnar Fusi     Current Status/Progress Goal Weekly Team Focus  Medical   Functional and cognitive deficits secondary to left thalamic hemorrhage  Improve mobility, cognition, CKD, anxiety, thrombocytopenia  See above   Bowel/Bladder   incontinent of b/b; LBM: 04/04  continent of B/B; maintain regular bowel pattern  continue time tolieting, gain continences   Swallow/Nutrition/ Hydration   Dys. 2 textures with thin liquids, Mod A   Mod A  use of swallowing strategies    ADL's   Mod/max A stand-pivot transfers, Max A LB ADLs, Min/mod  A  UB ADLs  Min A- likely downgraded some to mod A  dc planning, self-care retraining, transfers, R NMR, dc planning, pt/family education   Mobility   min-mod assist bed mobility and stand pivot transfers, gait remains non-functional   min assist overall, w/c level   OOB tolerance/endurance, transfers, discharge planning, family education   Communication             Safety/Cognition/ Behavioral Observations   Mod-Max A   Mod A   orientation, attention, problem solving    Pain   no c/o pain  remain free of pain   assess pain qshift and prn   Skin   skin tear x2 on right arml MASD on buttocks  resolution of current issues no further skin breakdown/infection; treat current skin issues per orders  assess skin qshift and prn       *See Care Plan and progress notes for long and short-term goals.     Barriers to Discharge  Current Status/Progress Possible Resolutions Date Resolved   Physician    Medical stability;Behavior     See above  Therapies, follow labs, cont supplemental O2      Nursing                  PT                    OT                  SLP                SW                Discharge Planning/Teaching Needs:  Pt to return home with family providing 24/7 assistance.    Teaching to be planned closer to d/c.   Team Discussion:  Still with anxiety;  Watching labs (platelets);  Still incont b/b. Wounds on arms are improving.  Min/mod bed mob and tfs.  Not expected to be functional ambulator.  Mod assist overall with OT and expect to make this the new goals.  Dys 2, thin diet with cues to slow down.  SW to schedule family ed end of week if possible.  Revisions to Treatment Plan:  NA    Continued Need for Acute Rehabilitation Level of Care: The patient requires daily medical management by a physician with specialized training in physical medicine and rehabilitation for the following conditions: Daily direction of a multidisciplinary physical rehabilitation program to ensure safe treatment while eliciting the highest outcome that is of practical value to the patient.: Yes Daily medical management of patient stability for increased activity during participation in an intensive rehabilitation regime.: Yes Daily analysis of laboratory values and/or radiology reports with any subsequent need for medication adjustment of medical intervention for : Renal problems;Other;Neurological  problems;Pulmonary problems   I attest that I was present, lead the team conference, and concur with the assessment and plan of the team.   Lennart Pall 08/08/2018, 3:44 PM    Team conference was held via web/ teleconference due to [place] - 19

## 2018-08-08 NOTE — Progress Notes (Signed)
Occupational Therapy Session Note  Patient Details  Name: Evan [name] MRN: [id number] Date of Birth: 05-Jan-1928  Today's Date: 08/08/2018 OT Individual Time: 1470-9295 OT Individual Time Calculation (min): 55 min    Short Term Goals: Week 1:  OT Short Term Goal 1 (Week 1): Pt will complete toilet transfer with max A  OT Short Term Goal 1 - Progress (Week 1): Met OT Short Term Goal 2 (Week 1): Pt will complete sit<>stand with max A in preparation for BADL tasks OT Short Term Goal 2 - Progress (Week 1): Met OT Short Term Goal 3 (Week 1): Pt will tolerate sitting EOB for 4 mins to increase endurance for BADL tasks OT Short Term Goal 3 - Progress (Week 1): Met  Skilled Therapeutic Interventions/Progress Updates:    1:1. Pt received in bed this morning with no c/o pian. Focus of session on bed mobility, supine>sitting, bathing and dressing. Pt requries 4 brief changes throughout session as soon as olf brief taken off, pt void urine. Pt rolls B with CGA-MIN A and VC for hand placement. Pt able to supine>sitting 2x during session with CGA. Pt rquires overall min A for sitting balance increaseing to mod A with fatigue while bathing UB at EOB with HOH A to wash LUE with RUE. Pt dons pants total A in supine and shirt EOB with MAX A overall. Exited session with pt seated in bed, in chair position, call light tin reach and exit alarm on  Therapy Documentation Precautions:  Precautions Precautions: Fall Precaution Comments: bruising along R side of neck Restrictions Weight Bearing Restrictions: No General:   Vital Signs:   Pain:   ADL: ADL Eating: Minimal assistance Grooming: Maximal assistance Upper Body Bathing: Dependent Lower Body Bathing: Dependent Upper Body Dressing: Maximal assistance Lower Body Dressing: Dependent Toileting: Dependent Toilet Transfer: Unable to assess Tub/Shower Transfer: Unable to assess Vision   Perception    Praxis   Exercises:   Other Treatments:      Therapy/Group: Individual Therapy  Tonny Branch 08/08/2018, 9:00 AM

## 2018-08-08 NOTE — Progress Notes (Signed)
Physical Therapy Session Note  Patient Details  Name: Evan [name] MRN: [id number] Date of Birth: 08-23-1927  Today's Date: 08/08/2018 PT Individual Time: 7408-1448 PT Individual Time Calculation (min): 46 min   Short Term Goals: Week 3:  PT Short Term Goal 1 (Week 4): =LTGs due to ELOS  Skilled Therapeutic Interventions/Progress Updates:    Patient in supine and agreeable to PT.  Supine to sit min A for R LE control and use of bed rail.  Patient seated EOB with S for balance attempted transfer to R to w/c, but pt leaning R and difficulty positioning so sat back on bed and moved w/c to L side.  Pt reported felt as if having BM.  Stood x 2 more times to see if emptying bowels, but noted nothing in brief.  So pivot to L with mod A and cues for several scoots to L.  Assisted in TIS w/c to therapy gym.  Pivot to R to mat min A.  Seated EOM with S while obtaining items for balance work.  Seated reaching to L to obtain horseshoes and seated to toss with cues for anterior weight shift and assist for R LE positioning.  Patient sit to supine on mat min A for R LE.  Stretching to R LE hamstrings, hip extensors and hip adductors, IR's.  Supine to sit mod A rolling to L first.  Transfer to w/c to L mod A.  Assisted to room and left in w/c with SLP in room replaced on 2L O2 in the room and maintained throughout on portable O2.  Therapy Documentation Precautions:  Precautions Precautions: Fall Precaution Comments: bruising along R side of neck Restrictions Weight Bearing Restrictions: No    Pain: Pain Assessment Pain Score: 0-No pain    Therapy/Group: Individual Therapy  Reginia Naas  Magda Kiel, PT 08/08/2018, 12:44 PM

## 2018-08-08 NOTE — Progress Notes (Signed)
Orthopedic Tech Progress Note Patient Details:  Evan [name] [birth year] [id number] Had to recall in order for PRAFO BOOT to HANGER. Ordering provider requested wrong boot yesterday. They ordered an AFO instead of PRAFO. Patient ID: Evan [name], male   DOB: 1927/07/12, 85 y.o.   MRN: [id number]   [name] 08/08/2018, 11:58 AM

## 2018-08-08 NOTE — Progress Notes (Signed)
Physical Therapy Session Note  Patient Details  Name: Evan [name] MRN: [id number] Date of Birth: 17-Dec-1927  Today's Date: 08/08/2018 PT Individual Time: 1130-1215 AND 1315-1330 PT Individual Time Calculation (min): 45 min AND 15 min, 60 min in total  Short Term Goals: Week 4:  PT Short Term Goal 1 (Week 4): =LTGs due to ELOS  Skilled Therapeutic Interventions/Progress Updates:   Session 1:  Pt in TIS and agreeable to therapy, no c/o pain. Just c/o feeling cold. Pt remained on 2 L/min O2 throughout session, no increased SOB w/ activity. Mild labored breathing at baseline today. Total assist w/c transport to/from therapy gym. Worked on standing posture and stand/squat pivot transfers this session. Performed multiple transfers in both directions this session, min assist at best and mod assist at worst. Manual assist needed to block RLE from immediately going into extension when attempting to stand and manual/verbal cues for weight shifting and upright posture. Sit<>stands from mat w/o UE support to work on midline control and static standing balance, min assist to boost into standing but max assist to maintain standing. Mirror for visual feedback w/ minimal improvement in midline orientation, pt w/ significant R lateral lean w/o LUE support. Pt reporting he needed to have a BM emergently. Returned to room via w/c and min assist stand pivot to EOB and to supine. Pt w/ no BM and reports he doesn't think he needs to go anymore. Pt now falling asleep in supine. Therapist providing R gastroc, hamstring, and adductor stretch in supine within available range. 30 sec x3 in each position. Ended session in supine and all needs in reach.   Session 2:  Returned 1 hour later to finish session after pt had rested. Pt declined OOB activity but requested assistance w/ voiding in urinal. Set-up assist to void while encouraged pt to engage w/ holding urinal himself. Pt continent of void w/ increased time. Ended session  in supine, all needs in reach.   Therapy Documentation Precautions:  Precautions Precautions: Fall Precaution Comments: bruising along R side of neck Restrictions Weight Bearing Restrictions: No  Therapy/Group: Individual Therapy  Yarithza Mink Clent Demark 08/08/2018, 12:48 PM

## 2018-08-08 NOTE — Progress Notes (Signed)
Speech Language Pathology Weekly Progress and Session Note  Patient Details  Name: Evan [name] MRN: [id number] Date of Birth: 1927-11-15  Beginning of progress report period: August 01, 2018 End of progress report period: Aug 08, 2018  Today's Date: 08/08/2018 SLP Individual Time: 9371-6967 SLP Individual Time Calculation (min): 35 min  Short Term Goals: Week 3: SLP Short Term Goal 1 (Week 3): Pt will demonstrate orientation x4 with mod verbal cues. SLP Short Term Goal 1 - Progress (Week 3): Not met SLP Short Term Goal 2 (Week 3): Pt will complete basic problem solving tasks with min assist given verbal cues. SLP Short Term Goal 2 - Progress (Week 3): Not met SLP Short Term Goal 3 (Week 3): Patient will consume current diet with minimal overt s/s of aspiration and overall Min A verbal cues for use of swallowing compensatory strategies.  SLP Short Term Goal 3 - Progress (Week 3): Not met    New Short Term Goals: Week 4: SLP Short Term Goal 1 (Week 4): Pt will demonstrate orientation x4 with mod verbal cues. SLP Short Term Goal 2 (Week 4): Pt will complete basic problem solving tasks with mod assist given verbal cues. SLP Short Term Goal 3 (Week 4): Patient will consume current diet with minimal overt s/s of aspiration and overall Min A verbal cues for use of swallowing compensatory strategies.   Weekly Progress Updates: Patient continues to make minimal gains and has not met any STGs this reporting period. Currently, patient is consuming Dys. 2 textures with thin liquids with minimal overt s/s of aspiration but continues to require overall Min-Mod A verbal cues for use of a slow rate and small bites/sips to minimize oral residue and maximize overall safety. Patient continues to be intermittently confused and requires overall Mod A verbal and visual cues for orientation to time, place and situation and Mod-Max A verbal cues for functional problem solving with basic and familiar tasks. Patient  and family eduction ongoing. Patient would benefit from continued skilled SLP intervention to maximize his cognitive and swallowing function prior to discharge in order to reduce caregiver burden.      Intensity: Minumum of 1-2 x/day, 30 to 90 minutes Frequency: 3 to 5 out of 7 days Duration/Length of Stay: 5/13 Treatment/Interventions: Cognitive remediation/compensation;Multimodal communication approach;Functional tasks;Therapeutic Activities;Patient/family education;Environmental controls;Internal/external aids;Cueing hierarchy;Dysphagia/aspiration precaution training   Daily Session  Skilled Therapeutic Interventions: Skilled treatment session focused on cognitive goals. Upon arrival, patient reported he had an incontinent bowel movement. SLP facilitated session by providing Mod-Max A verbal and tactile cues for sequencing and problem solving during self-care tasks while standing in the [place]. Patient also required Max A verbal cues for use of visual aids for orientation to place and time. Patient initiated asking appropriate questions about discharging planning. Patient left upright in wheelchair with alarm on and all needs within reach. Continue with current plan of care.       Pain No/Denies Pain   Therapy/Group: Individual Therapy  Dinnis Rog 08/08/2018, 6:51 AM

## 2018-08-08 NOTE — Progress Notes (Signed)
[facility] PHYSICAL MEDICINE & REHABILITATION PROGRESS NOTE  Subjective/Complaints: Patient seen sitting up in bed this morning.  No reported issues overnight.  No sleep chart available.  Patient states he does not feel well again this morning, but denies any specific issues.  However observe patient with therapies yesterday and able to ambulate with assistance.  ROS: Denies shortness of breath, denies chest pain, N/V/D.  Objective: Vital Signs: Blood pressure 129/69, pulse 76, temperature 98.2 F (36.8 C), resp. rate 20, height 5\' 5"  (1.651 m), weight 73.2 kg, SpO2 98 %. No results found. Recent Labs    08/06/18 0644 08/08/18 0537  WBC 8.3 7.7  HGB 11.1* 11.3*  HCT 33.4* 34.0*  PLT 131* PENDING   Recent Labs    08/06/18 0644 08/08/18 0537  NA 133* 133*  K 3.9 4.2  CL 101 102  CO2 20* 20*  GLUCOSE 121* 117*  BUN 25* 25*  CREATININE 1.27* 1.30*  CALCIUM 8.7* 8.8*    Physical Exam: BP 129/69 (BP Location: Left Arm)   Pulse 76   Temp 98.2 F (36.8 C)   Resp 20   Ht 5\' 5"  (1.651 m)   Wt 73.2 kg   SpO2 98%   BMI 26.85 kg/m  Constitutional: NAD. Vital signs reviewed. HENT: Normocephalic.  Atraumatic. Eyes: EOMI.  No discharge. Cardiovascular: No JVD. Respiratory: Normal effort.  + [place]. GI: Non-distended. Musc: No edema or tenderness in extremities. Neurological:  He alert. Extremely HOH.  Motor:  RUE: Shoulder abduction 4-/5, elbow flexion/extension 4-/5, handgrip 4-/5, stable Right lower extremity: Hip flexion, knee extension 4-/5, ankle dorsiflexion 4-/5, stable Skin: Right neck with ecchymosis and hard indurated area under chin, improving.  Psychiatric: Anxious   Assessment/Plan: 1. 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  Physiatrist is providing close team supervision and 24 hour management of active medical problems listed  below.  Physiatrist and rehab team continue to assess barriers to discharge/monitor patient progress toward functional and medical goals  Care Tool:  Bathing    Body parts bathed by patient: Left arm, Chest, Abdomen, Front perineal area   Body parts bathed by helper: Right arm, Left arm, Chest, Abdomen, Front perineal area, Right lower leg, Left upper leg, Right upper leg, Buttocks, Left lower leg, Face     Bathing assist Assist Level: Maximal Assistance - Patient 24 - 49%     Upper Body Dressing/Undressing Upper body dressing Upper body dressing/undressing activity did not occur (including orthotics): N/A What is the patient wearing?: Pull over shirt    Upper body assist Assist Level: Moderate Assistance - Patient 50 - 74%    Lower Body Dressing/Undressing Lower body dressing    Lower body dressing activity did not occur: N/A What is the patient wearing?: Incontinence brief, Pants     Lower body assist Assist for lower body dressing: Total Assistance - Patient < 25%     Toileting Toileting Toileting Activity did not occur Landscape architect and hygiene only): N/A (no void or bm)  Toileting assist Assist for toileting: Maximal Assistance - Patient 25 - 49%     Transfers Chair/bed transfer  Transfers assist  Chair/bed transfer activity did not occur: Safety/medical concerns  Chair/bed transfer assist level: Minimal Assistance - Patient > 75%     Locomotion Ambulation   Ambulation assist   Ambulation activity did not occur: Safety/medical concerns  Assist level: Maximal Assistance - Patient 25 -  49% Assistive device: Other (comment)(rail in hallway) Max distance: 15'   Walk 10 feet activity   Assist  Walk 10 feet activity did not occur: Safety/medical concerns  Assist level: Maximal Assistance - Patient 25 - 49% Assistive device: Other (comment)(rail in hallway)   Walk 50 feet activity   Assist Walk 50 feet with 2 turns activity did not occur:  Safety/medical concerns         Walk 150 feet activity   Assist Walk 150 feet activity did not occur: Safety/medical concerns         Walk 10 feet on uneven surface  activity   Assist Walk 10 feet on uneven surfaces activity did not occur: Safety/medical concerns         Wheelchair     Assist Will patient use wheelchair at discharge?: Yes Type of Wheelchair: Manual    Wheelchair assist level: Supervision/Verbal cueing Max wheelchair distance: 20'    Wheelchair 50 feet with 2 turns activity    Assist        Assist Level: Dependent - Patient 0%   Wheelchair 150 feet activity     Assist     Assist Level: Dependent - Patient 0%      Medical Problem List and Plan: 1.Functional and cognitive 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  Cont CIR  CT head performed on 4/20 showing slight improvement.  Please see PA note-discussed with neurology, repeat CT personally reviewed- continued improvement in hemorrhagic infarct, after PA discussion with neurology, ASA initiated.  Discussed again option of palliative care with family- educated on goals of palliative care-daughter remains reluctant at this point, states that she is going to discuss with other family members.  Cont to await decision on 5/6.  Team conference today to discuss current and goals and coordination of care, home and environmental barriers, and discharge planning with nursing, case manager, and therapies.  2. Antithrombotics: -PE/LLEDVT/anticoagulation:None due to [place].  PE resolved on most recent CTA. -antiplatelet therapy: ASA 3. Pain Management:tylenol prn. 4. Mood:LCSW to follow for evaluation and support. antipsychotic agents: N/A.   Ativan tid PTA, increased to 0.75 TID on 4/21  Prozac increased on 5/4. 5. Neuropsych: This patientis not fullycapable of making decisions onhisown behalf. 6. Skin/Wound Care:Monitor right neck hematoma  for resolution. 7. Fluids/Electrolytes/Nutrition:Monitor I/O.  8.ABLA: Continue to monitor H/H/Platelets with serial checks. Monitor for signs of bleeding.   Hemoglobin 11.3 on 5/6  Continue to monitor 9. Right forearm wound/ recent cellulitis:Completed course of Keflex on 4/16. Added Aqacell to wound as per derm records. 10.CKD stage III: Avoid nephrotoxic medications. Monitor with serial checks.   Creatinine 1.30 on 5/6  Encourage fluids  Continue to monitor 11. H/o A fib: Monitor HR bid. Continue metoprolol bid--off Lovenox.  12. H/o Depression: On Prozac daily with ativan tid.  70. H/o Lung cancer: Had PE on Eliquis. No antiplatelets or anticoagulation for now.   CTA on 4/18- PE resolved, progression of right hilum abnormality, chronic radiation scarring  Off  steroids  Added Mucinex dm to help with cough/clearance  -encourage IS/FV  -pulmonary toilet   Continue supplemental oxygen  Chest x-ray with some fluid, BNP elevated  Lasix 20 started on 5/1  Patient was seen by Pulm-no new recommendations. 14. Labile blood pressure-    Vitals:   08/07/18 1934 08/08/18 0438  BP: 117/66 129/69  Pulse: 74 76  Resp: 19 20  Temp: 97.8 F (36.6 C) 98.2 F (36.8 C)  SpO2: 98% 98%   Relatively  controlled on 5/6 15.  Steroid-induced hyperglycemia  Off steroids now, remains elevated 16. Leukocytosis:  Resolved  WBCs 8.3 on 5/4  Afebrile  CXR reviewed, unremarkable for infection 17.  Hyponatremia  Sodium 133 on 5/4 18. Thrombocytopenia  Plts clumps, labs reordered  Cont to monitor  LOS: 23 days A FACE TO FACE EVALUATION WAS PERFORMED  Ankit Lorie Phenix 08/08/2018, 8:36 AM

## 2018-08-09 ENCOUNTER — Inpatient Hospital Stay (HOSPITAL_COMMUNITY): Payer: Medicare HMO | Admitting: Speech Pathology

## 2018-08-09 ENCOUNTER — Inpatient Hospital Stay (HOSPITAL_COMMUNITY): Payer: Medicare HMO | Admitting: Physical Therapy

## 2018-08-09 ENCOUNTER — Inpatient Hospital Stay (HOSPITAL_COMMUNITY): Payer: Medicare HMO | Admitting: Occupational Therapy

## 2018-08-09 MED ORDER — ACETAMINOPHEN 325 MG PO TABS: 325.0000 mg | ORAL_TABLET | ORAL | Status: DC | PRN

## 2018-08-09 MED ORDER — IPRATROPIUM-ALBUTEROL 0.5-2.5 (3) MG/3ML IN SOLN
3.0000 mL | RESPIRATORY_TRACT | Status: DC | PRN
  Administered 2018-08-13: 3 mL via RESPIRATORY_TRACT
  Filled 2018-08-09: qty 3

## 2018-08-09 MED ORDER — IPRATROPIUM-ALBUTEROL 0.5-2.5 (3) MG/3ML IN SOLN
3.0000 mL | Freq: Two times a day (BID) | RESPIRATORY_TRACT | Status: DC
  Administered 2018-08-09 – 2018-08-12 (×6): 3 mL via RESPIRATORY_TRACT
  Filled 2018-08-09 (×6): qty 3

## 2018-08-09 MED ORDER — IPRATROPIUM-ALBUTEROL 0.5-2.5 (3) MG/3ML IN SOLN: 3.0000 mL | Freq: Two times a day (BID) | RESPIRATORY_TRACT | Status: DC

## 2018-08-09 NOTE — Progress Notes (Signed)
[facility] PHYSICAL MEDICINE & REHABILITATION PROGRESS NOTE  Subjective/Complaints: Patient seen sitting up in bed this AM, eating breakfast.  No sleep chart available.  He states he did not sleep well overnight.  He has questions about his discharge date.  He is more calm today.  ROS: Denies shortness of breath, denies chest pain, N/V/D.  Objective: Vital Signs: Blood pressure 138/83, pulse 87, temperature 98 F (36.7 C), temperature source Oral, resp. rate 19, height 5\' 5"  (1.651 m), weight 73.7 kg, SpO2 95 %. No results found. Recent Labs    08/08/18 0537 08/08/18 0925  WBC 7.7  --   HGB 11.3*  --   HCT 34.0*  --   PLT PLATELET CLUMPS NOTED ON SMEAR, UNABLE TO ESTIMATE 120*   Recent Labs    08/08/18 0537  NA 133*  K 4.2  CL 102  CO2 20*  GLUCOSE 117*  BUN 25*  CREATININE 1.30*  CALCIUM 8.8*    Physical Exam: BP 138/83 (BP Location: Left Arm)   Pulse 87   Temp 98 F (36.7 C) (Oral)   Resp 19   Ht 5\' 5"  (1.651 m)   Wt 73.7 kg   SpO2 95%   BMI 27.04 kg/m  Constitutional: NAD. Vital signs reviewed. HENT: Normocephalic.  Atraumatic. Eyes: EOMI.  No discharge. Cardiovascular: No JVD. Respiratory: Normal effort.  + [place]. GI: Non-distended. Musc: No edema or tenderness in extremities. Neurological:  He alert. Extremely HOH.  Motor:  RUE: Shoulder abduction 4-/5, elbow flexion/extension 4-/5, handgrip 4-/5, unchanged Right lower extremity: Hip flexion, knee extension 4/5, ankle dorsiflexion 4/5 Skin: Right neck with ecchymosis and hard indurated area under chin, improving.  Psychiatric: Calm today   Assessment/Plan: 1. 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  Physiatrist is providing close team supervision and 24 hour management of active medical problems listed below.  Physiatrist and rehab team continue to assess barriers to  discharge/monitor patient progress toward functional and medical goals  Care Tool:  Bathing    Body parts bathed by patient: Left arm, Chest, Abdomen, Front perineal area   Body parts bathed by helper: Right arm, Left arm, Chest, Abdomen, Front perineal area, Right lower leg, Left upper leg, Right upper leg, Buttocks, Left lower leg, Face     Bathing assist Assist Level: Maximal Assistance - Patient 24 - 49%     Upper Body Dressing/Undressing Upper body dressing Upper body dressing/undressing activity did not occur (including orthotics): N/A What is the patient wearing?: Pull over shirt    Upper body assist Assist Level: Moderate Assistance - Patient 50 - 74%    Lower Body Dressing/Undressing Lower body dressing    Lower body dressing activity did not occur: N/A What is the patient wearing?: Incontinence brief, Pants     Lower body assist Assist for lower body dressing: Total Assistance - Patient < 25%     Toileting Toileting Toileting Activity did not occur Landscape architect and hygiene only): N/A (no void or bm)  Toileting assist Assist for toileting: Maximal Assistance - Patient 25 - 49%     Transfers Chair/bed transfer  Transfers assist  Chair/bed transfer activity did not occur: Safety/medical concerns  Chair/bed transfer assist level: Minimal Assistance - Patient > 75%     Locomotion Ambulation   Ambulation assist   Ambulation activity did not occur: Safety/medical concerns  Assist level: Maximal Assistance - Patient 25 - 49% Assistive  device: Other (comment)(rail in hallway) Max distance: 15'   Walk 10 feet activity   Assist  Walk 10 feet activity did not occur: Safety/medical concerns  Assist level: Maximal Assistance - Patient 25 - 49% Assistive device: Other (comment)(rail in hallway)   Walk 50 feet activity   Assist Walk 50 feet with 2 turns activity did not occur: Safety/medical concerns         Walk 150 feet  activity   Assist Walk 150 feet activity did not occur: Safety/medical concerns         Walk 10 feet on uneven surface  activity   Assist Walk 10 feet on uneven surfaces activity did not occur: Safety/medical concerns         Wheelchair     Assist Will patient use wheelchair at discharge?: Yes Type of Wheelchair: Manual    Wheelchair assist level: Supervision/Verbal cueing Max wheelchair distance: 20'    Wheelchair 50 feet with 2 turns activity    Assist        Assist Level: Dependent - Patient 0%   Wheelchair 150 feet activity     Assist     Assist Level: Dependent - Patient 0%      Medical Problem List and Plan: 1.Functional and cognitive 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  Cont CIR  CT head performed on 4/20 showing slight improvement.  Please see PA note-discussed with neurology, repeat CT personally reviewed- continued improvement in hemorrhagic infarct, after PA discussion with neurology, ASA initiated.  Discussed again option of palliative care with family- educated on goals of palliative care-daughter remains reluctant at this point, states that she is going to discuss with other family members.  No decision on 5/7. 2. Antithrombotics: -PE/LLEDVT/anticoagulation:None due to [place].  PE resolved on most recent CTA. -antiplatelet therapy: ASA 3. Pain Management:tylenol prn. 4. Mood:LCSW to follow for evaluation and support. antipsychotic agents: N/A.   Ativan tid PTA, increased to 0.75 TID on 4/21  Prozac increased on 5/4. 5. Neuropsych: This patientis not fullycapable of making decisions onhisown behalf. 6. Skin/Wound Care:Monitor right neck hematoma for resolution. 7. Fluids/Electrolytes/Nutrition:Monitor I/O.  8.ABLA: Continue to monitor H/H/Platelets with serial checks. Monitor for signs of bleeding.   Hemoglobin 11.3 on 5/6, labs ordered for tomorrow  Continue to monitor 9.  Right forearm wound/ recent cellulitis:Completed course of Keflex on 4/16. Added Aqacell to wound as per derm records. 10.CKD stage III: Avoid nephrotoxic medications. Monitor with serial checks.   Creatinine 1.30 on 5/6  Encourage fluids  Continue to monitor 11. H/o A fib: Monitor HR bid. Continue metoprolol bid--off Lovenox.  12. H/o Depression: On Prozac daily with ativan tid.  70. H/o Lung cancer: Had PE on Eliquis. No antiplatelets or anticoagulation for now.   CTA on 4/18- PE resolved, progression of right hilum abnormality, chronic radiation scarring  Off  steroids  Added Mucinex dm to help with cough/clearance  -encourage IS/FV  -pulmonary toilet   Continue supplemental oxygen  Chest x-ray with some fluid, BNP elevated  Lasix 20 started on 5/1  Patient was seen by Pulm-no new recommendations. 14. Labile blood pressure-    Vitals:   08/08/18 2042 08/09/18 0425  BP: (!) 121/58 138/83  Pulse: 77 87  Resp:  19  Temp:  98 F (36.7 C)  SpO2:  95%   Labile on 5/7 15.  Steroid-induced hyperglycemia  Off steroids now, remains elevated 16. Leukocytosis:  Resolved  WBCs 8.3 on 5/4  Afebrile  CXR reviewed, unremarkable for infection  17.  Hyponatremia  Sodium 133 on 5/4 18. Thrombocytopenia  Plts 120 on 5/6, may need to d/c ASA, especially given recent [employer]  Labs ordered for tomorrow  Will discuss with pharmacy and PA  Cont to monitor  LOS: 24 days A FACE TO [place] 08/09/2018, 10:11 AM

## 2018-08-09 NOTE — Progress Notes (Signed)
Occupational Therapy Weekly Progress Note  Patient Details  Name: ADEN SEK MRN: [id number] Date of Birth: 1927/09/09  Beginning of progress report period: July 17, 2018 End of progress report period: Aug 09, 2018  Today's Date: 08/09/2018 OT Individual Time: 2585-2778 OT Individual Time Calculation (min): 75 min    Patient has met 3 of 3 short term goals.  Pt has made progress towards OT goals this week. Pt has demonstrated improved balance, improved sit<>stand, transfers, and improved activity tolerance within BADL tasks. Pt can transfer at times with as little as min A, but more consistently requires mod A and multiple cues. He can sit EOB with supervision/min A pending fatigue level and has  Tolerated being out of bed in TIS wc more this week. Continue current POC.   Patient continues to demonstrate the following deficits: muscle weakness, decreased cardiorespiratoy endurance and decreased oxygen support, impaired timing and sequencing, abnormal tone, unbalanced muscle activation, motor apraxia, ataxia, decreased coordination and decreased motor planning, decreased attention to right, right side neglect and decreased motor planning, decreased initiation, decreased attention, decreased awareness, decreased problem solving, decreased safety awareness, decreased memory and delayed processing and decreased sitting balance, decreased standing balance, decreased postural control, hemiplegia and decreased balance strategies and therefore will continue to benefit from skilled OT intervention to enhance overall performance with Reduce care partner burden.  Patient progressing toward long term goals..  Continue plan of care.  OT Short Term Goals Week 3:  OT Short Term Goal 1 (Week 3): Pt will complete toilet transfer with mod A OT Short Term Goal 1 - Progress (Week 3): Met OT Short Term Goal 2 (Week 3): Pt will maintain standing balance for 1 minute with no more than mod A OT Short Term Goal 2 -  Progress (Week 3): Met OT Short Term Goal 3 (Week 3): Pt will use R UE for 50% of bathing tasks with mod verbal cues OT Short Term Goal 3 - Progress (Week 3): Met Week 4:  OT Short Term Goal 1 (Week 4): STG=LTG 2.2 ELOS  Skilled Therapeutic Interventions/Progress Updates:    Pt greeted semi-reclined in bed, agreeable to OT treatment session. Pt incontinent of urine. Worked on Rolling L and R with min A to change brief. Pt on 2L of O2 throughout session. Pt came to sitting EOB with min A. Static sitting balance maintained at supervision with decreased pushing today. Worked on LB dressing sitting EOB and anterior weight shift with reaching forward to help pull pants up over knees. Hand over hand A to integrate R UE for neuro re-ed. OT sat in straight back chair in front of pt to facilitation weight bearing through R LE and block it from adducting. Pt able to reach to chair arm and power up into standing with min A. OT then pulled patients pants over hips. Pt returned to sitting. OT cued pt for hand placement for stand-pivot into wc. Pt came to standing again with min A and was able to pivot to wc with min A and OT facilitation of R LE. Pt brought to the sink in wc and worked on UB bathing/dressing. Pt reported pain in R shoulder-likely 2/2 subluxation. Intermittent hand over hand A to integrate R UE in bathing tasks. Worked on 3 more sit<>stands from wc with min A. OT then changed out hearing aid batteries and wax guard tips with improved hearing. Pt left tilted in wc with alarm belt on and call bell in reach.   Therapy Documentation  Precautions:  Precautions Precautions: Fall Precaution Comments: bruising along R side of neck Restrictions Weight Bearing Restrictions: No  Therapy/Group: Individual Therapy  Valma Cava 08/09/2018, 10:55 AM

## 2018-08-09 NOTE — Progress Notes (Signed)
Physical Therapy Session Note  Patient Details  Name: Evan [name] MRN: [id number] Date of Birth: 07-03-1927  Today's Date: 08/09/2018 PT Individual Time: 1130-1210 PT Individual Time Calculation (min): 40 min   Short Term Goals: Week 4:  PT Short Term Goal 1 (Week 4): =LTGs due to ELOS  Skilled Therapeutic Interventions/Progress Updates:   Pt in TIS and agreeable to therapy, no c/o pain. Total assist w/c transport to/from therapy gym for time management. Worked on car transfers this session to car of sedan height. Performed to/from car x4 reps. 1st and 2nd transfer were both max assist w/ verbal, visual, and manual cues for correct technique. Last 2 transfers min-mod assist. Needed prolonged seated rest break in between each transfer 2/2 increased labored breathing, however O2 @ 99% on 2 L/min of supplemental O2. Returned to room total assist in w/c, ended session tilted back in chair, all needs in reach.   Therapy Documentation Precautions:  Precautions Precautions: Fall Precaution Comments: bruising along R side of neck Restrictions Weight Bearing Restrictions: No  Therapy/Group: Individual Therapy  Evan [name] 08/09/2018, 12:24 PM

## 2018-08-09 NOTE — Progress Notes (Signed)
Received report from NT,that the pt. Is feeling some pain around his left shoulder /upper chest.After assessing the pt.,he was unable to point exactly the part that is hearting.VS were normal,Pam Love PA-C was notified.Pt. verbalized feeling better after few minutes,EKG was done as order.Keep monitoring pt. Closely.

## 2018-08-09 NOTE — Progress Notes (Signed)
Speech Language Pathology Daily Session Note  Patient Details  Name: Evan [name] MRN: [id number] Date of Birth: [birth year]  Today's Date: 08/09/2018 SLP Individual Time: 1235-1315 SLP Individual Time Calculation (min): 40 min  Short Term Goals: Week 4: SLP Short Term Goal 1 (Week 4): Pt will demonstrate orientation x4 with mod verbal cues. SLP Short Term Goal 2 (Week 4): Pt will complete basic problem solving tasks with mod assist given verbal cues. SLP Short Term Goal 3 (Week 4): Patient will consume current diet with minimal overt s/s of aspiration and overall Min A verbal cues for use of swallowing compensatory strategies.   Skilled Therapeutic Interventions: Skilled treatment session focused on dysphagia and cognitive goals. SLP facilitated session by providing skilled observation with lunch meal of Dys. 2 textures with thin liquids. Patient required Max A verbal cues to self-monitor and correct bite/sip size and oral residue which led to consistent overt s/s of aspiration with thin liquids, suspect due to large, sequential sips with large amounts of mixed consistencies due to patient utilizing liquid washes to assist in clearing residue. Overt s/s of aspiration were eliminated with nectar-thick liquids with meals. Therefore, recommend patient consume nectar-thick liquids with meals and initiate the water protocol. While during oral care, patient's skin appeared to be peeling from his tongue, RN aware.  Patient lethargic throughout session and required Min A verbal cues to sustain attention to task. Patient left upright in wheelchair with alarm on and all needs within reach. Continue with current plan of care.      Pain Pain Assessment Pain Scale: 0-10 Pain Score: 0-No pain Faces Pain Scale: No hurt  Therapy/Group: Individual Therapy  Madalena Kesecker, [place] 08/09/2018, 1:59 PM

## 2018-08-10 ENCOUNTER — Encounter (HOSPITAL_COMMUNITY): Payer: Medicare HMO

## 2018-08-10 ENCOUNTER — Encounter (HOSPITAL_COMMUNITY): Payer: Medicare HMO | Admitting: Speech Pathology

## 2018-08-10 ENCOUNTER — Ambulatory Visit (HOSPITAL_COMMUNITY): Payer: Medicare HMO

## 2018-08-10 DIAGNOSIS — I69391 Dysphagia following cerebral infarction: Secondary | ICD-10-CM

## 2018-08-10 LAB — CBC WITH DIFFERENTIAL/PLATELET
Abs Immature Granulocytes: 0.07 10*3/uL (ref 0.00–0.07)
Basophils Absolute: 0 10*3/uL (ref 0.0–0.1)
Basophils Relative: 0 %
Eosinophils Absolute: 0.1 10*3/uL (ref 0.0–0.5)
Eosinophils Relative: 1 %
HCT: 30.5 % — ABNORMAL LOW (ref 39.0–52.0)
Hemoglobin: 10.3 g/dL — ABNORMAL LOW (ref 13.0–17.0)
Immature Granulocytes: 1 %
Lymphocytes Relative: 21 %
Lymphs Abs: 1.5 10*3/uL (ref 0.7–4.0)
MCH: 31.4 pg (ref 26.0–34.0)
MCHC: 33.8 g/dL (ref 30.0–36.0)
MCV: 93 fL (ref 80.0–100.0)
Monocytes Absolute: 1.1 10*3/uL — ABNORMAL HIGH (ref 0.1–1.0)
Monocytes Relative: 16 %
Neutro Abs: 4.3 10*3/uL (ref 1.7–7.7)
Neutrophils Relative %: 61 %
Platelets: 125 10*3/uL — ABNORMAL LOW (ref 150–400)
RBC: 3.28 MIL/uL — ABNORMAL LOW (ref 4.22–5.81)
RDW: 13.4 % (ref 11.5–15.5)
WBC: 7.1 10*3/uL (ref 4.0–10.5)
nRBC: 0 % (ref 0.0–0.2)

## 2018-08-10 MED ORDER — FLUOXETINE HCL 20 MG PO CAPS: 20.0000 mg | ORAL_CAPSULE | Freq: Every day | ORAL | Status: DC

## 2018-08-10 MED ORDER — FLUOXETINE HCL 20 MG/5ML PO SOLN
20.0000 mg | Freq: Every day | ORAL | Status: DC
  Filled 2018-08-10: qty 5

## 2018-08-10 NOTE — Progress Notes (Signed)
Physical Therapy Session Note  Patient Details  Name: Evan [name] MRN: [id number] Date of Birth: 1927/08/04  Today's Date: 08/10/2018 PT Individual Time: 1430-1525 PT Individual Time Calculation (min): 55 min   Short Term Goals: Week 4:  PT Short Term Goal 1 (Week 4): =LTGs due to ELOS  Skilled Therapeutic Interventions/Progress Updates:    Patient seen with son, Alvester Chou and daughter present for observing patient's mobility levels.  Patient in TIS w/c and transferred via squat pivot to standard w/c.  Patient maintained on 2L O2 throughout session.  Patient propelled w/c with min a and mod to max cues for L foot use after re-orienting to hemi-technique x 50'.  Patient transferred to sedan height car with mod A and educating son and daughter throughout to managing placement of R foot, utilizing head/hip relationship to their advantage and assisting pt to manage R arm as noted skin tear L elbow and placed gauze due to bloody drainage.  Discussed importane of 2 care-providers for safety to have one holding w/c and to assist with hips if pt not rotating to seat.  Patient assisted in w/c back to room and performed sit to stand transfer at sink mod A for keeping R foot on floor due to slipping out x 1 on first trial.  Stood about 20 sec prior to needing to sit.  Transferred to bed to L mod A and sit to supine mod A for LE's.  Rolled and changed brief and cleaned due to incontinent BM during session with NT assistance and changed linen due to urinary incontinence during cleaning.  Left with NT and family in room to speak with OT and SW end of session.    Therapy Documentation Precautions:  Precautions Precautions: Fall Precaution Comments: bruising along R side of neck Restrictions Weight Bearing Restrictions: No Pain: Pain Assessment Pain Score: 0-No pain    Therapy/Group: Individual Therapy  Reginia Naas  Magda Kiel, PT 08/10/2018, 6:31 PM

## 2018-08-10 NOTE — Progress Notes (Signed)
Speech Language Pathology Daily Session Note  Patient Details  Name: DENSON NICCOLI MRN: [id number] Date of Birth: [birth year]  Today's Date: 08/10/2018 SLP Individual Time: 27-1430 SLP Individual Time Calculation (min): 45 min  Short Term Goals: Week 4: SLP Short Term Goal 1 (Week 4): Pt will demonstrate orientation x4 with mod verbal cues. SLP Short Term Goal 2 (Week 4): Pt will complete basic problem solving tasks with mod assist given verbal cues. SLP Short Term Goal 3 (Week 4): Patient will consume current diet with minimal overt s/s of aspiration and overall Min A verbal cues for use of swallowing compensatory strategies.   Skilled Therapeutic Interventions: Skilled treatment session focused on family education with the patient's daughter and son. Both were educated in regards to current swallowing function, diet recommendations, appropriate textures, swallowing compensatory strategies and how to thicken liquids. Both were also educated in regards to current cognitive functioning and strategies to utilize at home to maximize safety and recall of daily information. Both verbalized understanding and all questions were answered. Suspect patient's family will need reinforcement of all information provided today. Patient handed off to PT. Continue with current plan of care.       Pain No/Denies Pain   Therapy/Group: Individual Therapy  Shaune Malacara 08/10/2018, 3:10 PM

## 2018-08-10 NOTE — Progress Notes (Addendum)
[place] PHYSICAL MEDICINE & REHABILITATION PROGRESS NOTE  Subjective/Complaints: Patient seen sitting up in bed this morning.  No reported issues overnight.  Patient states he slept well.  He is calm this morning.  He asks when he can go home on a few occasions.  Discussed thrombocytopenia with neurology yesterday.  ROS: Denies shortness of breath, denies chest pain, N/V/D.  Objective: Vital Signs: Blood pressure (!) 145/86, pulse 73, temperature 98 F (36.7 C), resp. rate 18, height 5\' 5"  (1.651 m), weight 74 kg, SpO2 94 %. No results found. Recent Labs    08/08/18 0537 08/08/18 0925 08/10/18 0444  WBC 7.7  --  7.1  HGB 11.3*  --  10.3*  HCT 34.0*  --  30.5*  PLT PLATELET CLUMPS NOTED ON SMEAR, UNABLE TO ESTIMATE 120* 125*   Recent Labs    08/08/18 0537  NA 133*  K 4.2  CL 102  CO2 20*  GLUCOSE 117*  BUN 25*  CREATININE 1.30*  CALCIUM 8.8*    Physical Exam: BP (!) 145/86 (BP Location: Left Arm)   Pulse 73   Temp 98 F (36.7 C)   Resp 18   Ht 5\' 5"  (1.651 m)   Wt 74 kg   SpO2 94%   BMI 27.15 kg/m  Constitutional: No distress . Vital signs reviewed. HENT: Normocephalic.  Atraumatic. Eyes: EOMI. No discharge. Cardiovascular: No JVD. Respiratory: Normal effort.  + [place]. GI: Non-distended. Musc: No edema or tenderness in extremities. Neurological:  He alert. Extremely HOH.  Motor:  RUE: Shoulder abduction 4-/5, elbow flexion/extension 4-/5, handgrip 4-/5, stable Right lower extremity: Hip flexion, knee extension 4-4+/5, ankle dorsiflexion 4-4+/5 Skin: Right neck with ecchymosis and hard indurated area under chin, improving.  Psychiatric: Normal mood.  Normal affect today.   Assessment/Plan: 1. 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  Physiatrist is providing close team supervision and 24 hour management of active medical problems  listed below.  Physiatrist and rehab team continue to assess barriers to discharge/monitor patient progress toward functional and medical goals  Care Tool:  Bathing    Body parts bathed by patient: Left arm, Chest, Abdomen, Front perineal area   Body parts bathed by helper: Right arm, Left arm, Chest, Abdomen, Front perineal area, Right lower leg, Left upper leg, Right upper leg, Buttocks, Left lower leg, Face     Bathing assist Assist Level: Maximal Assistance - Patient 24 - 49%     Upper Body Dressing/Undressing Upper body dressing Upper body dressing/undressing activity did not occur (including orthotics): N/A What is the patient wearing?: Pull over shirt    Upper body assist Assist Level: Moderate Assistance - Patient 50 - 74%    Lower Body Dressing/Undressing Lower body dressing    Lower body dressing activity did not occur: N/A What is the patient wearing?: Incontinence brief, Pants     Lower body assist Assist for lower body dressing: Total Assistance - Patient < 25%     Toileting Toileting Toileting Activity did not occur Landscape architect and hygiene only): N/A (no void or bm)  Toileting assist Assist for toileting: Maximal Assistance - Patient 25 - 49%     Transfers Chair/bed transfer  Transfers assist  Chair/bed transfer activity did not occur: Safety/medical concerns  Chair/bed transfer assist level: Moderate Assistance - Patient 50 - 74%     Locomotion Ambulation   Ambulation assist   Ambulation activity did  not occur: Safety/medical concerns  Assist level: Maximal Assistance - Patient 25 - 49% Assistive device: Other (comment)(rail in hallway) Max distance: 15'   Walk 10 feet activity   Assist  Walk 10 feet activity did not occur: Safety/medical concerns  Assist level: Maximal Assistance - Patient 25 - 49% Assistive device: Other (comment)(rail in hallway)   Walk 50 feet activity   Assist Walk 50 feet with 2 turns activity did  not occur: Safety/medical concerns         Walk 150 feet activity   Assist Walk 150 feet activity did not occur: Safety/medical concerns         Walk 10 feet on uneven surface  activity   Assist Walk 10 feet on uneven surfaces activity did not occur: Safety/medical concerns         Wheelchair     Assist Will patient use wheelchair at discharge?: Yes Type of Wheelchair: Manual    Wheelchair assist level: Supervision/Verbal cueing Max wheelchair distance: 20'    Wheelchair 50 feet with 2 turns activity    Assist        Assist Level: Dependent - Patient 0%   Wheelchair 150 feet activity     Assist     Assist Level: Dependent - Patient 0%      Medical Problem List and Plan: 1.Functional and cognitive 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  Cont CIR  CT head performed on 4/20 showing slight improvement.  Please see PA note-discussed with neurology, repeat CT personally reviewed- continued improvement in hemorrhagic infarct, after PA discussion with neurology, ASA initiated.  Discussed again option of palliative care with family- educated on goals of palliative care-daughter remains reluctant at this point, states that she is going to discuss with other family members.  No decision on 5/8. 2. Antithrombotics: -PE/LLEDVT/anticoagulation:None due to [place].  PE resolved on most recent CTA. -antiplatelet therapy: ASA.  Discussed with neurology-plan to DC ASA if platelets drop below 70,000, discussed with PA. 3. Pain Management:tylenol prn. 4. Mood:LCSW to follow for evaluation and support. antipsychotic agents: N/A.   Ativan tid PTA, increased to 0.75 TID on 4/21  Prozac increased on 5/4, DC'd on 5/8 given critical QTC.  Extensive discussion with pharmacy and PA.. 5. Neuropsych: This patientis not fullycapable of making decisions onhisown behalf. 6. Skin/Wound Care:Monitor right neck hematoma for  resolution. 7. Fluids/Electrolytes/Nutrition:Monitor I/O.   D2 nectars, advance diet as tolerated 8.ABLA: Continue to monitor H/H/Platelets with serial checks. Monitor for signs of bleeding.   Hemoglobin 10.3 on 5/8, labs ordered for Monday  Hemoccult stool ordered  Continue to monitor 9. Right forearm wound/ recent cellulitis:Completed course of Keflex on 4/16. Added Aqacell to wound as per derm records. 10.CKD stage III: Avoid nephrotoxic medications. Monitor with serial checks.   Creatinine 1.30 on 5/6, labs ordered for Monday  Encourage fluids  Continue to monitor 11. H/o A fib: Monitor HR bid. Continue metoprolol bid--off Lovenox.  12. H/o Depression: On Prozac daily with ativan tid.  41. H/o Lung cancer: Had PE on Eliquis. No antiplatelets or anticoagulation for now.   CTA on 4/18- PE resolved, progression of right hilum abnormality, chronic radiation scarring  Off  steroids  Added Mucinex dm to help with cough/clearance  -encourage IS/FV  -pulmonary toilet   Continue supplemental oxygen  Continue nebulizers  Chest x-ray with some fluid, BNP elevated  Lasix 20 started on 5/1  Patient was seen by Pulm-no new recommendations. 14. Labile blood pressure-    Vitals:  08/10/18 0505 08/10/18 0744  BP: (!) 145/86   Pulse: 73   Resp: 18   Temp: 98 F (36.7 C)   SpO2: 95% 94%   Labile on 5/8 15.  Steroid-induced hyperglycemia  Off steroids now, remains elevated 16. Leukocytosis:  Resolved  WBCs 8.3 on 5/4  Afebrile  CXR reviewed, unremarkable for infection 17.  Hyponatremia  Sodium 133 on 5/4, labs ordered for Monday 18. Thrombocytopenia  Plts 125 on 5/8, labs ordered for Monday  Cont to monitor 19.  Prolonged QTC  Critical value on 5/8  Fluoxetine DC'd  Repeat ECG ordered for Monday  LOS: 25 days A FACE TO FACE EVALUATION WAS PERFORMED  Evan [name] 08/10/2018, 10:17 AM

## 2018-08-10 NOTE — Progress Notes (Signed)
Occupational Therapy Session Note  Patient Details  Name: Evan [name] MRN: [id number] Date of Birth: 1927/05/15  Today's Date: 08/10/2018 OT Individual Time: 1300-1345 OT Individual Time Calculation (min): 45 min    Short Term Goals: Week 1:  OT Short Term Goal 1 (Week 1): Pt will complete toilet transfer with max A  OT Short Term Goal 1 - Progress (Week 1): Met OT Short Term Goal 2 (Week 1): Pt will complete sit<>stand with max A in preparation for BADL tasks OT Short Term Goal 2 - Progress (Week 1): Met OT Short Term Goal 3 (Week 1): Pt will tolerate sitting EOB for 4 mins to increase endurance for BADL tasks OT Short Term Goal 3 - Progress (Week 1): Met  Skilled Therapeutic Interventions/Progress Updates:    1:1. Pt received in bed with family present for family education at pt current functional level as well as addressing questions prior to hands on training. Introduced idea of bed level bathing/dressing recommendations, body mechanics, transfers, knee blocking, rolling, sit to stand, stand pivot transfers, positioning in bed/chair, preventing subluxation, pushers syndrome, and hemi dressing. Pt rolls B with MIN A and concise cues in B direction to doff incontinence brief 2x and don pants. Pt supine to sit EOB with MIN A and MIN A provided for sitting balance d/t pushing tendencies. Pt stand pivot transfer with MOD A to TIS with VC for hand placement and weight shifting. Exited session with direct handoff to SLP to continue education. Family aware they will need to come back in for hands on training for mobility/ADLs.  Therapy Documentation Precautions:  Precautions Precautions: Fall Precaution Comments: bruising along R side of neck Restrictions Weight Bearing Restrictions: No General:   Vital Signs:  Pain:   ADL: ADL Eating: Minimal assistance Grooming: Maximal assistance Upper Body Bathing: Dependent Lower Body Bathing: Dependent Upper Body Dressing: Maximal  assistance Lower Body Dressing: Dependent Toileting: Dependent Toilet Transfer: Unable to assess Tub/Shower Transfer: Unable to assess Vision   Perception    Praxis   Exercises:   Other Treatments:     Therapy/Group: Individual Therapy  Tonny Branch 08/10/2018, 4:18 PM

## 2018-08-11 ENCOUNTER — Inpatient Hospital Stay (HOSPITAL_COMMUNITY): Payer: Medicare HMO | Admitting: Physical Therapy

## 2018-08-11 ENCOUNTER — Inpatient Hospital Stay (HOSPITAL_COMMUNITY): Payer: Medicare HMO | Admitting: Occupational Therapy

## 2018-08-11 LAB — OCCULT BLOOD X 1 CARD TO LAB, STOOL: Fecal Occult Bld: NEGATIVE

## 2018-08-11 NOTE — Progress Notes (Signed)
Physical Therapy Session Note  Patient Details  Name: Evan [name] MRN: [id number] Date of Birth: 1928/03/16  Today's Date: 08/11/2018 PT Individual Time: 0923-3007 PT Individual Time Calculation (min): 66 min   Short Term Goals: Week 4:  PT Short Term Goal 1 (Week 4): =LTGs due to ELOS  Skilled Therapeutic Interventions/Progress Updates:    Pt received supine in bed with nursing staff present and pt agreeable to therapy session with encouragement. Pt maintained on 2L of O2 via nasal canula throughout session. Pt noted to be incontinent of bladder in brief. Performed supine to sit, HOB elevated and using bedrails, with mod assist for trunk upright and LE management. Performed sit<>stand x3 from EOB to stedy with min assist for lifting and blocking R LE knee extension progressed to [place] for steadying. Pt able to maintain standing balance for ~62minutes in stedy with intermittent CGA while therapist performed total assist LB clothing management and peri-care. Pt performed R squat/stand pivot transfer EOB to w/c with max assist for balance and lifting/pivoting hips. Pt transported to/from therapy gym in w/c. Performed R squat pivot transfer w/c to EOM with mod assist for lifting/pivoting hips and blocking R LE knee extension - cuing throughout for sequencing. Sit<>stand from EOM to RW with mod assist for lifting/balance. Pt reports he was incontinent of bowels. Squat pivot transfer EOM>w/c with mod assist for lifting/pivoting hips and transported back to room. R squat pivot transfer w/c to EOB with mod assist for lifting/pivoting hips. Performed sit<>stand EOB to stedy with min assist/CGA for lifting and min assist for blocking R LE knee extension. Pt stood for ~2-56minutes in stedy with intermittent CGA while therapist performed total assist LB clothing management and peri-care - cuing throughout for midline trunk due to R lateral lean. Performed sit to supine with mod assist for B LE management. Pt left  supine in bed with needs in reach, lines intact, and bed alarm on.    Therapy Documentation Precautions:  Precautions Precautions: Fall Precaution Comments: bruising along R side of neck Restrictions Weight Bearing Restrictions: No  Pain:   No reports of pain during session.   Therapy/Group: Individual Therapy  Tawana Scale, PT, DPT 08/11/2018, 7:54 AM

## 2018-08-11 NOTE — Progress Notes (Signed)
Occupational Therapy Session Note  Patient Details  Name: Evan [name] MRN: [id number] Date of Birth: Aug 18, 1927  Today's Date: 08/11/2018 OT Individual Time: 3578-9784 OT Individual Time Calculation (min): 75 min    Skilled Therapeutic Interventions/Progress Updates: Patient in bed asleep upon approach for OT.   He stated, "I cannot hear good." He asked for clinician to repeat 98% of statements.   His hearing aides were not located by the patient or this clinician.   Patient stated he was 'sleepy' and sighed in between dozes but tolerated session to stay awake and participatory when encouraged by clinician, in spite of sleepy, groggy nature.      Focus of session was as follows: Bathed supine in bed with max A and moderate hand over hand assist to use right upper extremity to wash and dress upper body and wash lower body and complete crossing midline motor function.   Patient initiated some shoulder and elbow motor function.     With moderate assist, He donned hosptial gown (no clean clothes present) with reminders to don gown up to shoulder on right side of body.  He required moderate assist for bed mobility for buttock cleansing and changing of brief and for bed repositioning at the end of the session.  Patient asked to remain in bed due to c/o fatigue at the end of the session, speaking with his son in law on the phone.   Bed alarm was engaged.   Nurse tech came in to assist patient with self feeding.  Continue OT Plan of care      Therapy Documentation Precautions:  Precautions Precautions: Fall Precaution Comments: bruising along R side of neck Restrictions Weight Bearing Restrictions: No  Pain:denied    Therapy/Group: Individual Therapy  [name] [facility] 08/11/2018, 11:24 AM

## 2018-08-11 NOTE — Progress Notes (Signed)
[place] PHYSICAL MEDICINE & REHABILITATION PROGRESS NOTE  Subjective/Complaints: No new issues overnight.  Was sleeping soundly when I arrived and appeared to be comfortable.  He did awaken fairly easily.  ROS: Limited due to cognitive/behavioral    Objective: Vital Signs: Blood pressure 110/64, pulse 72, temperature 99 F (37.2 C), temperature source Oral, resp. rate 20, height 5\' 5"  (1.651 m), weight 73.2 kg, SpO2 98 %. No results found. Recent Labs    08/08/18 0925 08/10/18 0444  WBC  --  7.1  HGB  --  10.3*  HCT  --  30.5*  PLT 120* 125*   No results for input(s): NA, K, CL, CO2, GLUCOSE, BUN, CREATININE, CALCIUM in the last 72 hours.  Physical Exam: BP 110/64 (BP Location: Left Arm)   Pulse 72   Temp 99 F (37.2 C) (Oral)   Resp 20   Ht 5\' 5"  (1.651 m)   Wt 73.2 kg   SpO2 98%   BMI 26.85 kg/m  Constitutional: No distress . Vital signs reviewed. HEENT: EOMI, oral membranes moist Neck: supple Cardiovascular: RRR without murmur. No JVD    Respiratory: CTA Bilaterally without wheezes or rales.  No increased effort of breathing seen today GI: BS +, non-tender, non-distended  Musc: No edema or tenderness in extremities. Neurological:  He alert.   Remains hard of hearing Motor:  RUE: Shoulder abduction 4-/5, elbow flexion/extension 4-/5, handgrip 4-/5, stable Right lower extremity: Hip flexion, knee extension 4-4+/5, ankle dorsiflexion 4-4+/5 motor exam unchanged Skin: Right neck with ecchymosis and hard indurated area under chin, improving.  Psychiatric: Flat   Assessment/Plan: 1. 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  Physiatrist is providing close team supervision and 24 hour management of active medical problems listed below.  Physiatrist and rehab team continue to assess barriers to discharge/monitor patient progress toward  functional and medical goals  Care Tool:  Bathing    Body parts bathed by patient: Left arm, Chest, Abdomen, Front perineal area   Body parts bathed by helper: Right arm, Left arm, Chest, Abdomen, Front perineal area, Right lower leg, Left upper leg, Right upper leg, Buttocks, Left lower leg, Face     Bathing assist Assist Level: Maximal Assistance - Patient 24 - 49%     Upper Body Dressing/Undressing Upper body dressing Upper body dressing/undressing activity did not occur (including orthotics): N/A What is the patient wearing?: Pull over shirt    Upper body assist Assist Level: Moderate Assistance - Patient 50 - 74%    Lower Body Dressing/Undressing Lower body dressing    Lower body dressing activity did not occur: N/A What is the patient wearing?: Incontinence brief, Pants     Lower body assist Assist for lower body dressing: Total Assistance - Patient < 25%     Toileting Toileting Toileting Activity did not occur Landscape architect and hygiene only): N/A (no void or bm)  Toileting assist Assist for toileting: 2 Helpers     Transfers Chair/bed transfer  Transfers assist  Chair/bed transfer activity did not occur: Safety/medical concerns  Chair/bed transfer assist level: Moderate Assistance - Patient 50 - 74%     Locomotion Ambulation   Ambulation assist   Ambulation activity did not occur: Safety/medical concerns  Assist level: Maximal Assistance - Patient 25 - 49% Assistive device: Other (comment)(rail in hallway) Max distance: 15'   Walk 10 feet activity   Assist  Walk 10 feet  activity did not occur: Safety/medical concerns  Assist level: Maximal Assistance - Patient 25 - 49% Assistive device: Other (comment)(rail in hallway)   Walk 50 feet activity   Assist Walk 50 feet with 2 turns activity did not occur: Safety/medical concerns         Walk 150 feet activity   Assist Walk 150 feet activity did not occur: Safety/medical  concerns         Walk 10 feet on uneven surface  activity   Assist Walk 10 feet on uneven surfaces activity did not occur: Safety/medical concerns         Wheelchair     Assist Will patient use wheelchair at discharge?: Yes Type of Wheelchair: Manual    Wheelchair assist level: Minimal Assistance - Patient > 75% Max wheelchair distance: 65'    Wheelchair 50 feet with 2 turns activity    Assist        Assist Level: Minimal Assistance - Patient > 75%   Wheelchair 150 feet activity     Assist     Assist Level: Dependent - Patient 0%      Medical Problem List and Plan: 1.Functional and cognitive 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  Cont CIR  CT head performed on 4/20 showing slight improvement.  Please see PA note-discussed with neurology, repeat CT personally reviewed- continued improvement in hemorrhagic infarct, after PA discussion with neurology, ASA initiated.  Family was in for observation and education yesterday.. 2. Antithrombotics: -PE/LLEDVT/anticoagulation:None due to [place].  PE resolved on most recent CTA. -antiplatelet therapy: ASA.  Discussed with neurology-plan to DC ASA if platelets drop below 70,000, discussed with PA. 3. Pain Management:tylenol prn. 4. Mood:LCSW to follow for evaluation and support. antipsychotic agents: N/A.   Ativan tid PTA, increased to 0.75 TID on 4/21  Prozac increased on 5/4, DC'd on 5/8 given critical QTC.  Extensive discussion with pharmacy and PA.. 5. Neuropsych: This patientis not fullycapable of making decisions onhisown behalf. 6. Skin/Wound Care:Monitor right neck hematoma for resolution. 7. Fluids/Electrolytes/Nutrition:Monitor I/O.   D2 nectars, advance diet as tolerated  -Intake actually fairly reasonable at present 8.ABLA: Continue to monitor H/H/Platelets with serial checks. Monitor for signs of bleeding.   Hemoglobin 10.3 on 5/8, labs ordered  for Monday  Hemoccult stool ordered and negative this morning  Continue to monitor 9. Right forearm wound/ recent cellulitis:Completed course of Keflex on 4/16. Added Aqacell to wound as per derm records. 10.CKD stage III: Avoid nephrotoxic medications. Monitor with serial checks.   Creatinine 1.30 on 5/6, labs ordered for Monday  Continue to encourage fluids    11. H/o A fib: Monitor HR bid. Continue metoprolol bid--off Lovenox.  12. H/o Depression: On Prozac daily with ativan tid.  79. H/o Lung cancer: Had PE on Eliquis. No antiplatelets or anticoagulation for now.   CTA on 4/18- PE resolved, progression of right hilum abnormality, chronic radiation scarring  Off  steroids  Added Mucinex dm to help with cough/clearance  -encourage IS/FV  -pulmonary toilet   Continue supplemental oxygen  Continue nebulizers  Chest x-ray with some fluid, BNP elevated  Lasix 20 started on 5/1  Patient was seen by Pulm-no new recommendations. 14. Labile blood pressure-    Vitals:   08/10/18 2105 08/11/18 0547  BP:  110/64  Pulse:  72  Resp:  20  Temp:  99 F (37.2 C)  SpO2: 96% 98%   Reasonable control today 15.  Steroid-induced hyperglycemia  Off steroids now, remains elevated 16. Leukocytosis:  Resolved  WBCs 8.3 on 5/4  Afebrile  CXR reviewed, unremarkable for infection 17.  Hyponatremia  Sodium 133 on 5/4, labs ordered for Monday 18. Thrombocytopenia  Plts 125 on 5/8, labs ordered for Monday  Cont to monitor 19.  Prolonged QTC  Critical value on 5/8  Fluoxetine DC'd  Repeat ECG ordered for Monday  LOS: 26 days A FACE TO [place] 08/11/2018, 8:44 AM

## 2018-08-12 ENCOUNTER — Inpatient Hospital Stay (HOSPITAL_COMMUNITY): Payer: Medicare HMO

## 2018-08-12 NOTE — Progress Notes (Signed)
[name] PHYSICAL MEDICINE & REHABILITATION PROGRESS NOTE  Subjective/Complaints: Pt resting and comfortable when I saw him this morning. No major issues this weekend  ROS: Limited due to cognitive/behavioral   Objective: Vital Signs: Blood pressure 124/60, pulse 82, temperature 98.7 F (37.1 C), temperature source Oral, resp. rate 18, height 5\' 5"  (1.651 m), weight 73.3 kg, SpO2 95 %. No results found. Recent Labs    08/10/18 0444  WBC 7.1  HGB 10.3*  HCT 30.5*  PLT 125*   No results for input(s): NA, K, CL, CO2, GLUCOSE, BUN, CREATININE, CALCIUM in the last 72 hours.  Physical Exam: BP 124/60   Pulse 82   Temp 98.7 F (37.1 C) (Oral)   Resp 18   Ht 5\' 5"  (1.651 m)   Wt 73.3 kg   SpO2 95%   BMI 26.89 kg/m  Constitutional: No distress . Vital signs reviewed. HEENT: EOMI, oral membranes moist Neck: supple Cardiovascular: RRR without murmur. No JVD    Respiratory: CTA Bilaterally without wheezes or rales. Normal effort    GI: BS +, non-tender, non-distended  Musc: No edema or tenderness in extremities. Neurological:  He alert. HOH, follows basic commands Motor:  RUE: Shoulder abduction 4-/5, elbow flexion/extension 4-/5, handgrip 4-/5, stable Right lower extremity: Hip flexion, knee extension 4-4+/5, ankle dorsiflexion 4-4+/5 motor exam unchanged Skin: Right neck with ecchymosis and hard indurated area under chin, improving.  Psychiatric: flat but engages   Assessment/Plan: 1. 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  Physiatrist is providing close team supervision and 24 hour management of active medical problems listed below.  Physiatrist and rehab team continue to assess barriers to discharge/monitor patient progress toward functional and medical goals  Care Tool:  Bathing    Body parts bathed by patient: Right arm, Chest, Abdomen,  Front perineal area, Right upper leg, Left upper leg   Body parts bathed by helper: Left arm, Right lower leg, Left lower leg, Face     Bathing assist Assist Level: Maximal Assistance - Patient 24 - 49%     Upper Body Dressing/Undressing Upper body dressing Upper body dressing/undressing activity did not occur (including orthotics): N/A What is the patient wearing?: Hospital gown only    Upper body assist Assist Level: Moderate Assistance - Patient 50 - 74%    Lower Body Dressing/Undressing Lower body dressing    Lower body dressing activity did not occur: N/A What is the patient wearing?: Incontinence brief, Pants     Lower body assist Assist for lower body dressing: Total Assistance - Patient < 25%     Toileting Toileting Toileting Activity did not occur Landscape architect and hygiene only): N/A (no void or bm)  Toileting assist Assist for toileting: 2 Helpers     Transfers Chair/bed transfer  Transfers assist  Chair/bed transfer activity did not occur: Safety/medical concerns  Chair/bed transfer assist level: Maximal Assistance - Patient 25 - 49%     Locomotion Ambulation   Ambulation assist   Ambulation activity did not occur: Safety/medical concerns  Assist level: Maximal Assistance - Patient 25 - 49% Assistive device: Other (comment)(rail in hallway) Max distance: 15'   Walk 10 feet activity   Assist  Walk 10 feet activity did not occur: Safety/medical concerns  Assist level: Maximal Assistance - Patient 25 - 49% Assistive device: Other (comment)(rail in hallway)   Walk 50 feet activity   Assist Walk 50 feet with  2 turns activity did not occur: Safety/medical concerns         Walk 150 feet activity   Assist Walk 150 feet activity did not occur: Safety/medical concerns         Walk 10 feet on uneven surface  activity   Assist Walk 10 feet on uneven surfaces activity did not occur: Safety/medical concerns          Wheelchair     Assist Will patient use wheelchair at discharge?: Yes Type of Wheelchair: Manual    Wheelchair assist level: Minimal Assistance - Patient > 75% Max wheelchair distance: 31'    Wheelchair 50 feet with 2 turns activity    Assist        Assist Level: Minimal Assistance - Patient > 75%   Wheelchair 150 feet activity     Assist     Assist Level: Dependent - Patient 0%      Medical Problem List and Plan: 1.Functional and cognitive 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  Cont CIR  CT head performed on 4/20 showing slight improvement.  Please see PA note-discussed with neurology, repeat CT personally reviewed- continued improvement in hemorrhagic infarct, after PA discussion with neurology, ASA initiated.  Family was in for observation and education yesterday.. 2. Antithrombotics: -PE/LLEDVT/anticoagulation:None due to [place].  PE resolved on most recent CTA. -antiplatelet therapy: ASA.  Discussed with neurology-plan to DC ASA if platelets drop below 70,000, discussed with PA. 3. Pain Management:tylenol prn. 4. Mood:LCSW to follow for evaluation and support. antipsychotic agents: N/A.   Ativan tid PTA, increased to 0.75 TID on 4/21  Prozac increased on 5/4, DC'd on 5/8 given critical QTC.  Extensive discussion with pharmacy and PA.. 5. Neuropsych: This patientis not fullycapable of making decisions onhisown behalf. 6. Skin/Wound Care:Monitor right neck hematoma for resolution. 7. Fluids/Electrolytes/Nutrition:Monitor I/O.   D2 nectars, advance diet as tolerated  -Intake remains fairly reasonable at present 8.ABLA: Continue to monitor H/H/Platelets with serial checks. Monitor for signs of bleeding.   Hemoglobin 10.3 on 5/8, labs ordered for Monday  Hemoccult stool ordered and negative this morning  Continue to monitor 9. Right forearm wound/ recent cellulitis:Completed course of Keflex on 4/16.  Added Aqacell to wound as per derm records. 10.CKD stage III: Avoid nephrotoxic medications. Monitor with serial checks.   Creatinine 1.30 on 5/6, labs ordered for Monday  Continue to encourage fluids    11. H/o A fib: Monitor HR bid. Continue metoprolol bid--off Lovenox.  12. H/o Depression: On Prozac daily with ativan tid.  61. H/o Lung cancer: Had PE on Eliquis. No antiplatelets or anticoagulation for now.   CTA on 4/18- PE resolved, progression of right hilum abnormality, chronic radiation scarring  Off  steroids  Added Mucinex dm to help with cough/clearance  -encourage IS/FV  -pulmonary toilet   Continue supplemental oxygen  Continue nebulizers  Chest x-ray with some fluid, BNP elevated  Lasix 20 started on 5/1  Patient was seen by Pulm-no new recommendations. 14. Labile blood pressure-    Vitals:   08/12/18 0748 08/12/18 0908  BP: 124/60   Pulse: 75 82  Resp:  18  Temp:    SpO2: 97% 95%   Reasonable control 5/10 15.  Steroid-induced hyperglycemia  Off steroids now, remains elevated 16. Leukocytosis:  Resolved  WBCs 8.3 on 5/4  Afebrile  CXR reviewed, unremarkable for infection 17.  Hyponatremia  Sodium 133 on 5/4, labs ordered for Monday 18. Thrombocytopenia  Plts 125 on 5/8, labs ordered for Monday  Cont to monitor 19.  Prolonged QTC  Critical value on 5/8  Fluoxetine DC'd  Repeat ECG ordered for Monday  LOS: 27 days A FACE TO [place] 08/12/2018, 11:08 AM

## 2018-08-12 NOTE — Progress Notes (Signed)
Occupational Therapy Session Note  Patient Details  Name: Evan [name] MRN: [id number] Date of Birth: 09/30/1927  Today's Date: 08/12/2018 OT Individual Time: 1045-1155 OT Individual Time Calculation (min): 70 min    Short Term Goals: Week 4:  OT Short Term Goal 1 (Week 4): STG=LTG 2.2 ELOS  Skilled Therapeutic Interventions/Progress Updates:    Session focused on ADL tasks and transfers. Pt received in TIS w/c with no c/o pain. Pt assisted in putting in hearing aids with pt able to direct care. Pt with intermittent desaturations when falling asleep to 88%, however once awaken his SpO2 rose to >94% on 2 LNC. Pt initiated bathing at sink and then c/o being too cold. Pt completed sit > stand with BUE support on sink and min lifting A. Pt had sudden incontinent BM and was returned to sitting. BSC was obtained and pt completed SPT to BSC with mod A. Pt required max A to reposition on BSC. Pt voided bowels further and completed sit > stand with min A for peri hygiene. In standing, pt would attempt to begin transfer several times despite cueing for static standing to complete peri hygiene. Pt then had incontinent urination in standing twice. Peri hygiene and LB cleansing provided each time with max A. Stedy was obtained d/t pt fatiguing with frequent incontinence. Pt was returned to TIS w/c. Retrograde massage provided to edenemous R hand and positioning in elevation. RN alerted to blister on R heel and on R ear- RN Dorothy entered room and applied dressings. Pt required frequent cueing for arousal and attention throughout session. Pt left sitting up with 2 L [place] on and all needs met, chair alarm belt fastened.   Therapy Documentation Precautions:  Precautions Precautions: Fall Precaution Comments: bruising along R side of neck Restrictions Weight Bearing Restrictions: No  Therapy/Group: Individual Therapy   H  08/12/2018, 12:27 PM  

## 2018-08-12 NOTE — Progress Notes (Signed)
Physical Therapy Session Note  Patient Details  Name: Evan [name] MRN: [id number] Date of Birth: 1927/06/23  Today's Date: 08/12/2018 PT Individual Time: 0802-0900 PT Individual Time Calculation (min): 58 min   Short Term Goals: Week 4:  PT Short Term Goal 1 (Week 4): =LTGs due to ELOS  Skilled Therapeutic Interventions/Progress Updates:    Pt supine in bed upon PT arrival, agreeable to therapy tx and denies pain. Pt requesting to eat breakfast this morning. Pt transferred to sitting EOB with mod assist. While seated EOB therapist looped LEs through pants, sit<>stand from EOB with mod assist while second helper pulled pants over hips. Pt worked on seated balance EOB this session while eating breakfast. Stand by assist for seated balance with cues to limit posterior lean, tactile/verbal cues for anterior weightshift in sitting. While eating therapist provided cues for smaller bites/sips, worked on pt feeding himself with L UE. Pt worked on stand pivot transfers this session from bed>w/c max assist, w/c<>therapy mat mod assist and then w/c>TIS max assist, cues for techniques and manual facilitation for weightshifting. Pt seated edge of mat performed 2 x 10 LAQ each LE. Pt left in TIS w/c at end of session with needs in reach and chair alarm set.   Therapy Documentation Precautions:  Precautions Precautions: Fall Precaution Comments: bruising along R side of neck Restrictions Weight Bearing Restrictions: No    Therapy/Group: Individual Therapy  Netta Corrigan, PT, DPT 08/12/2018, 7:47 AM

## 2018-08-13 ENCOUNTER — Inpatient Hospital Stay (HOSPITAL_COMMUNITY): Payer: Medicare HMO

## 2018-08-13 ENCOUNTER — Inpatient Hospital Stay (HOSPITAL_COMMUNITY): Payer: Medicare HMO | Admitting: Speech Pathology

## 2018-08-13 ENCOUNTER — Inpatient Hospital Stay (HOSPITAL_COMMUNITY): Payer: Medicare HMO | Admitting: Occupational Therapy

## 2018-08-13 DIAGNOSIS — F339 Major depressive disorder, recurrent, unspecified: Secondary | ICD-10-CM

## 2018-08-13 DIAGNOSIS — R9431 Abnormal electrocardiogram [ECG] [EKG]: Secondary | ICD-10-CM

## 2018-08-13 LAB — BASIC METABOLIC PANEL
Anion gap: 10 (ref 5–15)
BUN: 20 mg/dL (ref 8–23)
CO2: 23 mmol/L (ref 22–32)
Calcium: 9 mg/dL (ref 8.9–10.3)
Chloride: 103 mmol/L (ref 98–111)
Creatinine, Ser: 1.21 mg/dL (ref 0.61–1.24)
GFR calc Af Amer: >60 mL/min (ref >60)
GFR calc non Af Amer: 53 mL/min — ABNORMAL LOW (ref >60)
Glucose, Bld: 120 mg/dL — ABNORMAL HIGH (ref 70–99)
Potassium: 4.2 mmol/L (ref 3.5–5.1)
Sodium: 136 mmol/L (ref 135–145)

## 2018-08-13 LAB — CBC WITH DIFFERENTIAL/PLATELET
Abs Immature Granulocytes: 0.11 10*3/uL — ABNORMAL HIGH (ref 0.00–0.07)
Basophils Absolute: 0 10*3/uL (ref 0.0–0.1)
Basophils Relative: 0 %
Eosinophils Absolute: 0.1 10*3/uL (ref 0.0–0.5)
Eosinophils Relative: 1 %
HCT: 33.5 % — ABNORMAL LOW (ref 39.0–52.0)
Hemoglobin: 11 g/dL — ABNORMAL LOW (ref 13.0–17.0)
Immature Granulocytes: 1 %
Lymphocytes Relative: 14 %
Lymphs Abs: 1.1 10*3/uL (ref 0.7–4.0)
MCH: 30.7 pg (ref 26.0–34.0)
MCHC: 32.8 g/dL (ref 30.0–36.0)
MCV: 93.6 fL (ref 80.0–100.0)
Monocytes Absolute: 1.2 10*3/uL — ABNORMAL HIGH (ref 0.1–1.0)
Monocytes Relative: 15 %
Neutro Abs: 5.6 10*3/uL (ref 1.7–7.7)
Neutrophils Relative %: 69 %
Platelets: 183 10*3/uL (ref 150–400)
RBC: 3.58 MIL/uL — ABNORMAL LOW (ref 4.22–5.81)
RDW: 13.3 % (ref 11.5–15.5)
WBC: 8.2 10*3/uL (ref 4.0–10.5)
nRBC: 0 % (ref 0.0–0.2)

## 2018-08-13 NOTE — Progress Notes (Signed)
Physical Therapy Session Note  Patient Details  Name: Evan [name] MRN: [id number] Date of Birth: 1928/01/23  Today's Date: 08/13/2018 PT Individual Time: 1330-1420 PT Individual Time Calculation (min): 50 min   Short Term Goals: Week 4:  PT Short Term Goal 1 (Week 4): =LTGs due to ELOS  Skilled Therapeutic Interventions/Progress Updates:    Patient in TIS w/c in room reports he is very cold, not wanting to participate right now due to needing to get warm, but agreeable with encouragement.  Transported to ortho gym in TIS w/c.  Practiced transfers to mat via stand pivot mod A to L, then seated EOB sit<>stand x 5 with chair back in front to pull up on, then to car via squat pivot with max instructional cues and increased time, mod A both ways, then stand step with RW to bed in room from w/c with mod A pt sitting EOB with uncontrolled descent.  Patient assisted to supine mod A and HOB elevated for positioning and optimal respiration.  Patient on RA throughout session and demonstrated SpO2 90% at lowest (except for when not getting a good reading/waveform and changing fingers, was at 84%.)  Patient requesting to urinate and assisted with urinal in supine.  Left with call bell in reach and bed alarm activated.   Therapy Documentation Precautions:  Precautions Precautions: Fall Precaution Comments: bruising along R side of neck Restrictions Weight Bearing Restrictions: No Pain: Pain Assessment Pain Score: 0-No pain    Therapy/Group: Individual Therapy  Reginia Naas  Magda Kiel, PT 08/13/2018, 4:56 PM

## 2018-08-13 NOTE — Progress Notes (Signed)
Occupational Therapy Session Note  Patient Details  Name: Evan [name] MRN: [id number] Date of Birth: [birth year]  Today's Date: 08/13/2018 OT Individual Time: 0930-1040 OT Individual Time Calculation (min): 70 min   Short Term Goals: Week 4:  OT Short Term Goal 1 (Week 4): STG=LTG 2.2 ELOS  Skilled Therapeutic Interventions/Progress Updates:    Pt greeted semi-reclined in bed and agreeable to OT treatment session. Pt stated " I'm peeing," but OT unable to place urinal in time. Pt had already been incontinent of urina a few times prior to brief change. Pt rolled in bed with supervision while OT chanegd brief and performed peri-care. Pt came to sitting EOB with increased time HOB raised, and min A. Worked on sitting balance at EOB with supervision static sitting, then min A with increased task demand. Pt able to follow commands to lean down onto L elbow to decrease pusher tendencies. Worked on anterior weight shift to help pull pants up, but max A to thread pant legs. Worked on sit<>stand with RW while OT managed R LE. Pt able to power up with min A, but needed mod A to maintain standing. OT removed RW, and attempted 2nd sit<>stand with OT sitting in chair in front of pt and pt using arm, rests to pull up. OT better able to manage R LE and keep it from adducting-improved extension in standing. Stand-pivot to wc on R side with mod A and assistance to manage R LE and facilitate pivot. Provided pt with shampoo cap and worked on B UE coordination with washing hair. Hand over hadn A to integrate R UE. Pt initiated threading R UE into shirt sleeve today, but still needed OT assist. Pt without much SOB this session and SpO2 at 98% on 2L. Given pt's pressure injury on R ear, OT removed O2 to see how he would do. Continuous O2 monitoring. Pt maintained at 93% and above on RA. Completed 3 sit<>stands with extended rest breaks and O2 still maintained at 93% at the lowest- average SpO2 was 96% on RA. OT informed RN of  pt status and pt left seated in TIS wc with alarm belt on, and needs met.   Therapy Documentation Precautions:  Precautions Precautions: Fall Precaution Comments: bruising along R side of neck Restrictions Weight Bearing Restrictions: No Vital Signs: Oxygen Therapy SpO2: 98 % O2 Device: Nasal Cannula O2 Flow Rate (L/min): 2 L/min Pain:   denies pain  Therapy/Group: Individual Therapy  Valma Cava 08/13/2018, 10:44 AM

## 2018-08-13 NOTE — Progress Notes (Signed)
Social Work Patient ID: Evan [name], male   DOB: Sep 12, 1927, 85 y.o.   MRN: [id number]   Met with pt, daughter and son this afternoon to review how family education went and determine if they are feeling that they will be able to manage his care at home.  Both feel they received a good review of his care needs and they are still planning to bring him home.  They do plan to hire private duty caregivers - I have supplied them with a list of agencies.  Have scheduled them to come back on Tues 5/12 for specific "hands on" training.  Kady Toothaker, LCSW

## 2018-08-13 NOTE — Progress Notes (Signed)
Speech Language Pathology Daily Session Note  Patient Details  Name: RULON ABDALLA MRN: [id number] Date of Birth: 1927/07/10  Today's Date: 08/13/2018 SLP Individual Time: 6468-0321 SLP Individual Time Calculation (min): 45 min  Short Term Goals: Week 4: SLP Short Term Goal 1 (Week 4): Pt will demonstrate orientation x4 with mod verbal cues. SLP Short Term Goal 2 (Week 4): Pt will complete basic problem solving tasks with mod assist given verbal cues. SLP Short Term Goal 3 (Week 4): Patient will consume current diet with minimal overt s/s of aspiration and overall Min A verbal cues for use of swallowing compensatory strategies.   Skilled Therapeutic Interventions: Skilled treatment session focused on dysphagia and cognitive goals. Upon arrival, patient was awake and yelling out "oh me." Patient reported he did not feel well but was unable to elaborate despite Max A multimodal cues but did deny pain. SLP facilitated session by providing Mod A verbal cues for use of swallowing compensatory strategies with breakfast meal of Dys. 2 textures with nectar-thick liquids without overt s/s of aspiration noted. Recommend patient continue current diet. Patient left upright in bed with alarm on and all needs within reach. Continue with current plan of care.      Pain No/Denies Pain   Therapy/Group: Individual Therapy  Dorean Daniello 08/13/2018, 8:26 AM

## 2018-08-13 NOTE — Plan of Care (Signed)
  Problem: Consults Goal: RH STROKE PATIENT EDUCATION Description See Patient Education module for education specifics  Outcome: Progressing Goal: Nutrition Consult-if indicated Outcome: Progressing Goal: Diabetes Guidelines if Diabetic/Glucose > 140 Description If diabetic or lab glucose is > 140 mg/dl - Initiate Diabetes/Hyperglycemia Guidelines & Document Interventions  Outcome: Progressing   Problem: RH BOWEL ELIMINATION Goal: RH STG MANAGE BOWEL WITH ASSISTANCE Description STG Manage Bowel with Mod Assistance.   Outcome: Progressing Goal: RH STG MANAGE BOWEL W/MEDICATION W/ASSISTANCE Description STG Manage Bowel with Medication with Assistance. mod  Outcome: Progressing Flowsheets (Taken 08/13/2018 1230) STG: Pt will manage bowels with medication with assistance: 5-Supervision/set up   Problem: RH BLADDER ELIMINATION Goal: RH STG MANAGE BLADDER WITH ASSISTANCE Description STG Manage Bladder With Mod Assistance   Outcome: Progressing Flowsheets (Taken 08/13/2018 1230) STG: Pt will manage bladder with assistance: 5-Supervision/set up Goal: RH STG MANAGE BLADDER WITH EQUIPMENT WITH ASSISTANCE Description STG Manage Bladder With Equipment With  Mod Assistance   Outcome: Progressing Flowsheets (Taken 08/13/2018 1230) STG: Pt will manage bladder with equipment with assistance: 3-Moderate assistance   Problem: RH SKIN INTEGRITY Goal: RH STG SKIN FREE OF INFECTION/BREAKDOWN Description Monitor skin for breakdown and skin tears during admission. No breakdown with mod assist   Outcome: Progressing Goal: RH STG MAINTAIN SKIN INTEGRITY WITH ASSISTANCE Description STG Maintain Skin Integrity With Mod Assistance.   Outcome: Progressing Flowsheets (Taken 08/13/2018 1230) STG: Maintain skin integrity with assistance: 4-Minimal assistance Goal: RH STG ABLE TO PERFORM INCISION/WOUND CARE W/ASSISTANCE Description STG Able To Perform Incision/Wound Care With Mod Assistance.    Outcome: Progressing Flowsheets (Taken 08/13/2018 1230) STG: Pt will be able to perform incision/wound care with assistance: 3-Moderate assistance   Problem: RH SAFETY Goal: RH STG ADHERE TO SAFETY PRECAUTIONS W/ASSISTANCE/DEVICE Description STG Adhere to Safety Precautions With Mod Assistance/Device.   Outcome: Progressing Flowsheets (Taken 08/13/2018 1230) STG:Pt will adhere to safety precautions with assistance/device: 4-Minimal assistance   Problem: RH COGNITION-NURSING Goal: RH STG USES MEMORY AIDS/STRATEGIES W/ASSIST TO PROBLEM SOLVE Description STG Uses Memory Aids/Strategies With Mod Assistance to Problem Solve.   Outcome: Progressing Flowsheets (Taken 08/13/2018 1230) STG: Uses memory aids/strategies with assistance: 4-Minimal assistance Goal: RH STG ANTICIPATES NEEDS/CALLS FOR ASSIST W/ASSIST/CUES Description STG Anticipates Needs/Calls for Assist With  Mod Assistance/Cues.   Outcome: Progressing Flowsheets (Taken 08/13/2018 1230) STG: Anticipates needs/calls for assistance with assistance/cues: 4-Minimal assistance   Problem: RH PAIN MANAGEMENT Goal: RH STG PAIN MANAGED AT OR BELOW PT'S PAIN GOAL Description Patient will be pain free or pain less than 3 during admission  Outcome: Progressing

## 2018-08-13 NOTE — Progress Notes (Signed)
Patient complained of SOB, O2 sat 98% Neb Tx given. We continue to monitor

## 2018-08-13 NOTE — Progress Notes (Signed)
  Patient ID: Evan [name], male   DOB: 03-09-1928, 85 y.o.   MRN: [id number]     Diagnosis code:  I69.251;  I25.10  Height:  5'5"  Weight:  176 lbs   Patient has suffered a severe thalamic stroke with hemiplegia which requires lower and upper body to be positioned in ways not feasible with a normal bed.  Head must be elevated at least 30 degress or he is at risk for aspiration.    Pt's frailty and hemiplegia requires frequent and immediate changes in body position which cannot be achieved with a normal bed.   Marlowe [place], PA-C

## 2018-08-13 NOTE — Progress Notes (Signed)
Occupational Therapy Session Note  Patient Details  Name: PARNELL SPIELER MRN: [id number] Date of Birth: 04/09/1927  Today's Date: 08/13/2018 OT Individual Time: 1130-1200 OT Individual Time Calculation (min): 30 min    Short Term Goals: Week 4:  OT Short Term Goal 1 (Week 4): STG=LTG 2.2 ELOS  Skilled Therapeutic Interventions/Progress Updates:    Patient reclined in TIS w/c, states that he is tired but willing to participate in therapy session.  Sit to stand with min A - patient with soiled brief - stood at bed rail min/mod a to maintain standing position with support of right LE, dependent to complete clothing management and hygiene after BM.  Patient completed seated UE reaching activities with rest breaks between due to SOB.  Patient remained in [place] w/c at close of session in reclined position with chair alarm on and call bell in reach.    Therapy Documentation Precautions:  Precautions Precautions: Fall Precaution Comments: bruising along R side of neck Restrictions Weight Bearing Restrictions: No General:   Vital Signs:  Pain: Pain Assessment Pain Scale: 0-10 Pain Score: 0-No pain   Other Treatments:     Therapy/Group: Individual Therapy  Carlos Levering 08/13/2018, 12:02 PM

## 2018-08-13 NOTE — Progress Notes (Signed)
  Patient ID: Evan [name], male   DOB: [birth year], 85 y.o.   MRN: [id number]    Diagnosis codes:  I69.251;  I25.10  Height:  5'5"             Weight:   176 lbs         Patient suffers from left thalamic stroke and CAD which impairs their ability to perform daily activities like toileting, dressing and mobility in the home.  A walker will not resolve issue with performing activities of daily living.  A wheelchair will allow patient to safely perform daily activities.  Patient is not able to propel themselves in the home using a standard weight wheelchair due to hemiparesis and weakness .  Patient can self propel in the lightweight wheelchair.    Lauraine Rinne, PA-C

## 2018-08-13 NOTE — Progress Notes (Signed)
[facility] PHYSICAL MEDICINE & REHABILITATION PROGRESS NOTE  Subjective/Complaints: Patient seen sitting up in bed this morning.  No reported issues overnight.  He is working with SLP.  Family came in for training on Friday, discussed with SLP.  Patient states he does not feel good, But cannot identify why.  He has not received his morning medications.  ROS: Limited due to behavior, cognition, HOH, but appears to deny CP, shortness of breath, nausea, vomiting, diarrhea.  Objective: Vital Signs: Blood pressure 136/78, pulse 76, temperature 99 F (37.2 C), temperature source Oral, resp. rate (!) 24, height 5\' 5"  (1.651 m), weight 72.7 kg, SpO2 98 %. No results found. No results for input(s): WBC, HGB, HCT, PLT in the last 72 hours. Recent Labs    08/13/18 0652  NA 136  K 4.2  CL 103  CO2 23  GLUCOSE 120*  BUN 20  CREATININE 1.21  CALCIUM 9.0    Physical Exam: BP 136/78 (BP Location: Left Arm)   Pulse 76   Temp 99 F (37.2 C) (Oral)   Resp (!) 24   Ht 5\' 5"  (1.651 m)   Wt 72.7 kg   SpO2 98%   BMI 26.67 kg/m  Constitutional: No distress . Vital signs reviewed. HENT: Normocephalic.  Atraumatic. Eyes: EOMI. No discharge. Cardiovascular: No JVD. Respiratory: Normal effort. GI: Non-distended. Musc: No edema or tenderness in extremities. Neurological:  He alert. Extreme HOH Motor:  RUE: Shoulder abduction 4-/5, elbow flexion/extension 4-/5, handgrip 4-/5, variable, but overall stable Right lower extremity: Hip flexion, knee extension 4-4+/5, ankle dorsiflexion 4-4+/5 variable, but overall stable Skin: Right neck with ecchymosis and hard indurated area under chin, improving.  Psychiatric: Anxious   Assessment/Plan: 1. 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  Physiatrist is providing close team supervision and 24 hour management of active medical  problems listed below.  Physiatrist and rehab team continue to assess barriers to discharge/monitor patient progress toward functional and medical goals  Care Tool:  Bathing    Body parts bathed by patient: Right arm, Chest, Abdomen, Front perineal area, Right upper leg, Left upper leg   Body parts bathed by helper: Left arm, Right lower leg, Left lower leg, Face     Bathing assist Assist Level: Maximal Assistance - Patient 24 - 49%     Upper Body Dressing/Undressing Upper body dressing Upper body dressing/undressing activity did not occur (including orthotics): N/A What is the patient wearing?: Hospital gown only    Upper body assist Assist Level: Moderate Assistance - Patient 50 - 74%    Lower Body Dressing/Undressing Lower body dressing    Lower body dressing activity did not occur: N/A What is the patient wearing?: Incontinence brief, Pants     Lower body assist Assist for lower body dressing: Total Assistance - Patient < 25%     Toileting Toileting Toileting Activity did not occur Landscape architect and hygiene only): N/A (no void or bm)  Toileting assist Assist for toileting: 2 Helpers     Transfers Chair/bed transfer  Transfers assist  Chair/bed transfer activity did not occur: Safety/medical concerns  Chair/bed transfer assist level: Maximal Assistance - Patient 25 - 49%     Locomotion Ambulation   Ambulation assist   Ambulation activity did not occur: Safety/medical concerns  Assist level: Maximal Assistance - Patient 25 - 49% Assistive device: Other (comment)(rail in hallway) Max distance: 15'   Walk 10 feet  activity   Assist  Walk 10 feet activity did not occur: Safety/medical concerns  Assist level: Maximal Assistance - Patient 25 - 49% Assistive device: Other (comment)(rail in hallway)   Walk 50 feet activity   Assist Walk 50 feet with 2 turns activity did not occur: Safety/medical concerns         Walk 150 feet  activity   Assist Walk 150 feet activity did not occur: Safety/medical concerns         Walk 10 feet on uneven surface  activity   Assist Walk 10 feet on uneven surfaces activity did not occur: Safety/medical concerns         Wheelchair     Assist Will patient use wheelchair at discharge?: Yes Type of Wheelchair: Manual    Wheelchair assist level: Minimal Assistance - Patient > 75% Max wheelchair distance: 36'    Wheelchair 50 feet with 2 turns activity    Assist        Assist Level: Minimal Assistance - Patient > 75%   Wheelchair 150 feet activity     Assist     Assist Level: Dependent - Patient 0%      Medical Problem List and Plan: 1.Functional and cognitive 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  Cont CIR  CT head performed on 4/20 showing slight improvement.  Please see PA note-discussed with neurology, repeat CT personally reviewed- continued improvement in hemorrhagic infarct, after PA discussion with neurology, ASA initiated.  Weekend notes reviewed- stable 2. Antithrombotics: -PE/LLEDVT/anticoagulation:None due to [place].  PE resolved on most recent CTA. -antiplatelet therapy: ASA.  Discussed with neurology-plan to DC ASA if platelets drop below 70,000. 3. Pain Management:tylenol prn. 4. Mood:LCSW to follow for evaluation and support. antipsychotic agents: N/A.   Ativan tid PTA, increased to 0.75 TID on 4/21  Prozac increased on 5/4, DC'd on 5/8 given critical QTC.. 5. Neuropsych: This patientis not fullycapable of making decisions onhisown behalf. 6. Skin/Wound Care:Monitor right neck hematoma for resolution. 7. Fluids/Electrolytes/Nutrition:Monitor I/O.   D2 nectars, advance diet as tolerated 8.ABLA: Continue to monitor H/H/Platelets with serial checks. Monitor for signs of bleeding.   Hemoglobin 10.3 on 5/8, labs pending  Hemoccult stool negative  Continue to monitor 9. Right  forearm wound/ recent cellulitis:Completed course of Keflex on 4/16. Added Aqacell to wound as per derm records. 10.CKD stage III: Avoid nephrotoxic medications. Monitor with serial checks.   Creatinine 1.21 on 5/11  Continue to encourage fluids 11. H/o A fib: Monitor HR bid. Continue metoprolol bid--off Lovenox.  12. H/o Depression: On Prozac daily with ativan tid.  54. H/o Lung cancer: Had PE on Eliquis. No antiplatelets or anticoagulation for now.   CTA on 4/18- PE resolved, progression of right hilum abnormality, chronic radiation scarring  Off  steroids  Added Mucinex dm to help with cough/clearance  -encourage IS/FV  -pulmonary toilet   Continue supplemental oxygen  Continue nebulizers  Chest x-ray with some fluid, BNP elevated  Lasix 20 started on 5/1  Patient was seen by Pulm-no new recommendations. 14. Labile blood pressure-    Vitals:   08/13/18 0524 08/13/18 0659  BP: 136/78   Pulse: 76   Resp: (!) 24   Temp: 99 F (37.2 C)   SpO2: 98% 98%   Labile on 5/11 15.  Steroid-induced hyperglycemia  Off steroids now, remains elevated 16. Leukocytosis:  Resolved  WBCs 8.3 on 5/4  Afebrile  CXR reviewed, unremarkable for infection 17.  Hyponatremia  Sodium 136 on 5/11 18. Thrombocytopenia  Plts 125 on 5/8, labs pending  Cont to monitor 19.  Prolonged QTC  Critical value on 5/8, remains elevated on 5/11 on repeat ECG, repeat ECG ordered for tomorrow  Fluoxetine DC'd on 5/8  Continue to monitor  LOS: 28 days A FACE TO FACE EVALUATION WAS PERFORMED  Raygan Skarda Lorie Phenix 08/13/2018, 9:25 AM

## 2018-08-14 ENCOUNTER — Encounter (HOSPITAL_COMMUNITY): Payer: Medicare HMO | Admitting: Speech Pathology

## 2018-08-14 ENCOUNTER — Encounter (HOSPITAL_COMMUNITY): Payer: Medicare HMO | Admitting: Occupational Therapy

## 2018-08-14 ENCOUNTER — Other Ambulatory Visit: Payer: Self-pay

## 2018-08-14 ENCOUNTER — Ambulatory Visit (HOSPITAL_COMMUNITY): Payer: Medicare HMO | Admitting: Physical Therapy

## 2018-08-14 MED ORDER — PANTOPRAZOLE SODIUM 40 MG PO TBEC: 40.0000 mg | DELAYED_RELEASE_TABLET | ORAL | 0 refills | Status: DC

## 2018-08-14 MED ORDER — CYCLOSPORINE 0.05 % OP EMUL: 1.0000 [drp] | Freq: Two times a day (BID) | OPHTHALMIC | 0 refills | Status: DC

## 2018-08-14 MED ORDER — FUROSEMIDE 20 MG PO TABS: 20.0000 mg | ORAL_TABLET | Freq: Every day | ORAL | 0 refills | Status: DC

## 2018-08-14 MED ORDER — POLYETHYLENE GLYCOL 3350 17 G PO PACK: 17.0000 g | PACK | Freq: Every day | ORAL | 0 refills | Status: DC | PRN

## 2018-08-14 MED ORDER — TAMSULOSIN HCL 0.4 MG PO CAPS: 0.4000 mg | ORAL_CAPSULE | Freq: Every day | ORAL | 0 refills | Status: DC

## 2018-08-14 MED ORDER — METOPROLOL TARTRATE 25 MG PO TABS: 12.5000 mg | ORAL_TABLET | Freq: Two times a day (BID) | ORAL | 0 refills | Status: DC

## 2018-08-14 MED ORDER — ASPIRIN 81 MG PO TBEC: 81.0000 mg | DELAYED_RELEASE_TABLET | Freq: Every day | ORAL | Status: DC

## 2018-08-14 MED ORDER — GERHARDT'S BUTT CREAM: 5.0000 "application " | TOPICAL_CREAM | Freq: Two times a day (BID) | CUTANEOUS | 0 refills | Status: DC

## 2018-08-14 MED ORDER — ROSUVASTATIN CALCIUM 5 MG PO TABS: 5.0000 mg | ORAL_TABLET | Freq: Every day | ORAL | 0 refills | Status: DC

## 2018-08-14 MED ORDER — MAGNESIUM OXIDE 400 (241.3 MG) MG PO TABS: 400.0000 mg | ORAL_TABLET | Freq: Two times a day (BID) | ORAL | 3 refills | Status: DC

## 2018-08-14 MED ORDER — LORAZEPAM 0.5 MG PO TABS: 0.7500 mg | ORAL_TABLET | Freq: Three times a day (TID) | ORAL | 0 refills | Status: DC

## 2018-08-14 MED FILL — METOPROLOL TARTRATE 25 MG T: 25 | 30 days supply | Qty: 30 | Fill #0

## 2018-08-14 MED FILL — PANTOPRAZOLE SOD DR 40 MG T: 40 | 60 days supply | Qty: 30 | Fill #0

## 2018-08-14 MED FILL — ROSUVASTATIN CALCIUM 5 MG T: 5 | 30 days supply | Qty: 30 | Fill #0

## 2018-08-14 MED FILL — TAMSULOSIN HCL 0.4 MG CAP: 0.4 | 30 days supply | Qty: 30 | Fill #0

## 2018-08-14 MED FILL — LORazepam 0.5 MG TABS: 0.5 | 15 days supply | Qty: 30 | Fill #0

## 2018-08-14 NOTE — Progress Notes (Signed)
[facility] PHYSICAL MEDICINE & REHABILITATION PROGRESS NOTE  Subjective/Complaints: Patient seen sitting up in bed eating breakfast this morning.  He states he slept very well overnight and feels good.  This is the first time he is ever said this.  He is aware of plans for discharge tomorrow.  ROS: Denies CP, shortness of breath, nausea, vomiting, diarrhea.  Objective: Vital Signs: Blood pressure 132/63, pulse 68, temperature 99 F (37.2 C), resp. rate 19, height 5\' 5"  (1.651 m), weight 71.4 kg, SpO2 96 %. No results found. Recent Labs    08/13/18 1042  WBC 8.2  HGB 11.0*  HCT 33.5*  PLT 183   Recent Labs    08/13/18 0652  NA 136  K 4.2  CL 103  CO2 23  GLUCOSE 120*  BUN 20  CREATININE 1.21  CALCIUM 9.0    Physical Exam: BP 132/63 (BP Location: Left Arm)   Pulse 68   Temp 99 F (37.2 C)   Resp 19   Ht 5\' 5"  (1.651 m)   Wt 71.4 kg   SpO2 96%   BMI 26.18 kg/m  Constitutional: No distress . Vital signs reviewed. HENT: Normocephalic.  Atraumatic. Eyes: EOMI. No discharge. Cardiovascular: No JVD. Respiratory: Normal effort.  + [place]. GI: Non-distended. Musc: No edema or tenderness in extremities. Neurological:  He alert. Extreme HOH Motor:  RUE: Shoulder abduction 4-/5, elbow flexion/extension 4-/5, handgrip 4-/5, variable, but overall unchanged Right lower extremity: Hip flexion, knee extension 4-4+/5, ankle dorsiflexion 4-4+/5 variable, but overall unchanged Skin: Right neck with ecchymosis and hard indurated area under chin, improved.  Psychiatric: Normal mood.  Normal affect.   Assessment/Plan: 1. 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  Physiatrist is providing close team supervision and 24 hour management of active medical problems listed below.  Physiatrist and rehab team continue to assess barriers to discharge/monitor patient  progress toward functional and medical goals  Care Tool:  Bathing    Body parts bathed by patient: Right arm, Chest, Abdomen, Front perineal area, Right upper leg, Left upper leg   Body parts bathed by helper: Left arm, Right lower leg, Left lower leg, Face     Bathing assist Assist Level: Maximal Assistance - Patient 24 - 49%     Upper Body Dressing/Undressing Upper body dressing Upper body dressing/undressing activity did not occur (including orthotics): N/A What is the patient wearing?: Hospital gown only    Upper body assist Assist Level: Moderate Assistance - Patient 50 - 74%    Lower Body Dressing/Undressing Lower body dressing    Lower body dressing activity did not occur: N/A What is the patient wearing?: Incontinence brief, Pants     Lower body assist Assist for lower body dressing: Total Assistance - Patient < 25%     Toileting Toileting Toileting Activity did not occur (Clothing management and hygiene only): N/A (no void or bm)  Toileting assist Assist for toileting: Total Assistance - Patient < 25%     Transfers Chair/bed transfer  Transfers assist  Chair/bed transfer activity did not occur: Safety/medical concerns  Chair/bed transfer assist level: Total Assistance - Patient < 25%     Locomotion Ambulation   Ambulation assist   Ambulation activity did not occur: Safety/medical concerns  Assist level: Maximal Assistance - Patient 25 - 49% Assistive device: Other (comment)(rail in hallway) Max distance: 15'   Walk 10 feet activity   Assist  Walk  10 feet activity did not occur: Safety/medical concerns  Assist level: Maximal Assistance - Patient 25 - 49% Assistive device: Other (comment)(rail in hallway)   Walk 50 feet activity   Assist Walk 50 feet with 2 turns activity did not occur: Safety/medical concerns         Walk 150 feet activity   Assist Walk 150 feet activity did not occur: Safety/medical concerns         Walk 10  feet on uneven surface  activity   Assist Walk 10 feet on uneven surfaces activity did not occur: Safety/medical concerns         Wheelchair     Assist Will patient use wheelchair at discharge?: Yes Type of Wheelchair: Manual    Wheelchair assist level: Minimal Assistance - Patient > 75% Max wheelchair distance: 14'    Wheelchair 50 feet with 2 turns activity    Assist        Assist Level: Minimal Assistance - Patient > 75%   Wheelchair 150 feet activity     Assist     Assist Level: Dependent - Patient 0%      Medical Problem List and Plan: 1.Functional and cognitive 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  Cont CIR  CT head performed on 4/20 showing slight improvement.  Please see PA note-discussed with neurology, repeat CT personally reviewed- continued improvement in hemorrhagic infarct, after PA discussion with neurology, ASA initiated.  Plan for d/c tomorrow  Will see patient for transitional care management in 1-2 weeks post-discharge 2. Antithrombotics: -PE/LLEDVT/anticoagulation:None due to [place].  PE resolved on most recent CTA. -antiplatelet therapy: ASA.  Discussed with neurology-plan to DC ASA if platelets drop below 70,000. 3. Pain Management:tylenol prn. 4. Mood:LCSW to follow for evaluation and support. antipsychotic agents: N/A.   Ativan tid PTA, increased to 0.75 TID on 4/21  Prozac increased on 5/4, DC'd on 5/8 given critical QTC.. 5. Neuropsych: This patientis not fullycapable of making decisions onhisown behalf. 6. Skin/Wound Care:Monitor right neck hematoma for resolution. 7. Fluids/Electrolytes/Nutrition:Monitor I/O.   D2 nectars, advance diet as tolerated 8.ABLA: Continue to monitor H/H/Platelets with serial checks. Monitor for signs of bleeding.   Hemoglobin 11.2 on 5/11  Hemoccult stool negative  Continue to monitor 9. Right forearm wound/ recent cellulitis:Completed  course of Keflex on 4/16. Added Aqacell to wound as per derm records. 10.CKD stage III: Avoid nephrotoxic medications. Monitor with serial checks.   Creatinine 1.21 on 5/11  Continue to encourage fluids 11. H/o A fib: Monitor HR bid. Continue metoprolol bid--off Lovenox.  12. H/o Depression: On Prozac daily with ativan tid.  24. H/o Lung cancer: Had PE on Eliquis. No antiplatelets or anticoagulation for now.   CTA on 4/18- PE resolved, progression of right hilum abnormality, chronic radiation scarring  Off  steroids  Added Mucinex dm to help with cough/clearance  -encourage IS/FV  -pulmonary toilet   Continue supplemental oxygen  Continue nebulizers  Chest x-ray with some fluid, BNP elevated  Lasix 20 started on 5/1  Patient was seen by Pulm-no new recommendations. 14. Labile blood pressure-    Vitals:   08/13/18 1955 08/14/18 0414  BP: 139/86 132/63  Pulse: (!) 103 68  Resp: (!) 26 19  Temp: 97.8 F (36.6 C) 99 F (37.2 C)  SpO2: 97% 96%   Relatively controlled on 5/12 15.  Steroid-induced hyperglycemia  Off steroids now, remains elevated 16. Leukocytosis:  Resolved  Afebrile  CXR reviewed, unremarkable for infection 17.  Hyponatremia  Sodium 136  on 5/11 18. Thrombocytopenia: Resolved  Plts 183 on 5/11  Cont to monitor 19.  Prolonged QTC  Critical value on 5/8, remains elevated on 5/11 on repeat ECG, repeat ECG reviewed, improved  Fluoxetine DC'd on 5/8-QTC improved off medication  Monitor for PACs, may consider restarting fluoxetine 20 mg daily and evaluate as outpatient.  Would not increase dose to 40 mg given effects on QTC  Continue to monitor  LOS: 29 days A FACE TO FACE EVALUATION WAS PERFORMED  Evan [name] 08/14/2018, 8:57 AM

## 2018-08-14 NOTE — Progress Notes (Signed)
Occupational Therapy Discharge Summary  Patient Details  Name: Evan [name] MRN: [id number] Date of Birth: May 14, 1927  Today's Date: 08/14/2018 OT Individual Time: 2993-7169 OT Individual Time Calculation (min): 82 min   Pt greeted sitting in regular wc with family present and agreeable to OT treatment session. Pt reports max fatigue and declines any standing at the sink for BADL tasks. OT educated family on pt positioning at the sink and how to initiate sit<>stand for ADLs, but pt would not demonstrate. Had pt practice transfer from wc back to bed. Pts Son and daughter needed verbal cues for positioning of wc and to remove legs rests prior to transfer. Gait belt still fastened around patient from earlier session. Pt's son assisted with mod A squat-pivot transfer back to bed.  Pt incontinent of BM and had incontinent void of bladder all over the bed sheets. Educated family on changing brief and bedding for incontinence. Educated on bed level LB bathing, then bringing pt to EOB or sitting up in wc for UB bathing/dressing. Answered questions regarding HH therapies, shaving at home using electric razor, incontinence, diaper rash cream, bed positioning for pressure relief, hearing aid positioning, and BADL modifications at home. Pt left semi-reclined in bed with bed alarm on and family present.  Patient has met 3 of 12 long term goals due to improved activity tolerance, improved balance, postural control, ability to compensate for deficits, functional use of  RIGHT upper and RIGHT lower extremity, improved attention, improved awareness and improved coordination.  Patient to discharge at overall Mod/Max Assist level.  Patient's care partner is independent to provide the necessary physical and cognitive assistance at discharge.    Reasons goals not met: Pt needs min A for dynamic sitting balance 2/2 pusher tendencies Pt needs min up to max A to maintain standing balance Pt requires Mod A for self-feeding 2/2  UE fatigue Pt requires up to min A for grooming tasks Pt requires Mod A for bathing tasks. He can perform UB bathing well, but needs more assistance for LB bathing.  Pt requires Max/total A for LB dressing Pt requires total A for toileting Pt requires Mod A for toilet transfers to [facility] Pt unsafe to complete tub shower transfers and will be limited to sponge bathing at home.  Recommendation:  Patient will benefit from ongoing skilled OT services in home health setting to continue to advance functional skills in the area of BADL and Reduce care partner burden.  Equipment: hospital bed, 3-in-1 BSC, 18x18 wc  Reasons for discharge: treatment goals met and discharge from hospital  Patient/family agrees with progress made and goals achieved: Yes  OT Discharge Precautions/Restrictions  Precautions Precautions: Fall Restrictions Weight Bearing Restrictions: No Pain  denies pain ADL ADL Eating: Moderate assistance Grooming: Minimal assistance Upper Body Bathing: Minimal assistance Lower Body Bathing: Maximal assistance Upper Body Dressing: Minimal assistance Lower Body Dressing: Maximal assistance Toileting: Dependent Toilet Transfer: Moderate assistance Tub/Shower Transfer: Unable to assess Perception  Perception: Impaired Inattention/Neglect: Does not attend to right side of body Praxis Praxis: Impaired Praxis Impairment Details: Initiation;Motor planning Cognition Overall Cognitive Status: Impaired/Different from baseline Arousal/Alertness: Lethargic Orientation Level: Oriented to situation;Oriented to person;Oriented to place;Disoriented to time Focused Attention: Appears intact Sustained Attention: Impaired Sustained Attention Impairment: Verbal basic;Functional basic Selective Attention: Impaired Selective Attention Impairment: Verbal basic;Functional basic Memory: Impaired Memory Impairment: Storage deficit;Retrieval deficit;Decreased recall of new  information Awareness: Impaired Awareness Impairment: Intellectual impairment Problem Solving: Impaired Problem Solving Impairment: Verbal basic;Functional basic Safety/Judgment: Impaired Sensation Sensation  Light Touch: Impaired Detail Light Touch Impaired Details: Absent RLE;Absent RUE Coordination Gross Motor Movements are Fluid and Coordinated: No Fine Motor Movements are Fluid and Coordinated: No Coordination and Movement Description: R side decreased smoothness and accuracy Finger Nose Finger Test: R dysmetria Heel Shin Test: Impaired RLE Motor  Motor Motor: Hemiplegia;Ataxia;Abnormal tone Motor - Discharge Observations: R hemi Mobility  Bed Mobility Bed Mobility: Rolling Right;Rolling Left;Supine to Sit;Sit to Supine Rolling Right: Minimal Assistance - Patient > 75% Rolling Left: Minimal Assistance - Patient > 75% Supine to Sit: Minimal Assistance - Patient > 75% Sit to Supine: Minimal Assistance - Patient > 75% Transfers Sit to Stand: Minimal Assistance - Patient > 75% Stand to Sit: Minimal Assistance - Patient > 75%  Trunk/Postural Assessment  Cervical Assessment Cervical Assessment: Exceptions to WFL(forward head ) Thoracic Assessment Thoracic Assessment: Exceptions to WFL(kyphotic) Lumbar Assessment Lumbar Assessment: Exceptions to WFL(posterior pelvic tilt) Postural Control Postural Control: Deficits on evaluation(impaired midline orientation, R lateral lean preference)  Balance Balance Balance Assessed: Yes Static Sitting Balance Static Sitting - Balance Support: No upper extremity supported;Feet supported Static Sitting - Level of Assistance: 5: Stand by assistance Dynamic Sitting Balance Dynamic Sitting - Balance Support: No upper extremity supported;Feet supported Dynamic Sitting - Level of Assistance: 4: Min assist Static Standing Balance Static Standing - Balance Support: During functional activity;Left upper extremity supported Static Standing -  Level of Assistance: 4: Min assist Dynamic Standing Balance Dynamic Standing - Balance Support: During functional activity Dynamic Standing - Level of Assistance: 3: Mod assist Extremity/Trunk Assessment RUE Assessment RUE Assessment: Exceptions to [facility] Active Range of Motion (AROM) Comments: Shoulder abduction 3+/5, elbow flexion/extension 3+/5, handgrip 3/5 RUE Body System: Neuro Brunstrum levels for arm and hand: Arm;Hand Brunstrum level for arm: Stage V Relative Independence from Synergy Brunstrum level for hand: Stage V Independence from basic synergies LUE Assessment LUE Assessment: Within Functional Limits   Daneen Schick Laurence Folz 08/14/2018, 4:17 PM

## 2018-08-14 NOTE — Progress Notes (Signed)
Physical Therapy Discharge Summary  Patient Details  Name: Evan [name] MRN: [id number] Date of Birth: 11-Jun-1927  Today's Date: 08/15/2018 PT Individual Time: 5400-8676 PT Individual Time Calculation (min): 25 min   Pt received in supine, ready to d/c from hospital. Supine to sit w/ min assist and mod assist stand pivot transfer to w/c. Total assist to front of hospital via w/c transport. Therapist accompanying pt to car to provide skilled assistance w/ car transfer as nursing staff not trained on mod assist transfer w/ this patient. Mod assist stand pivot transfer to car. Ended session in car and in care of family members.   Patient has met 1 of 10 long term goals due to limited meaningful participation in therapies, complexity of medical impairments affecting his participation in therapies, and poor carryover from session to session.  Patient to discharge at a wheelchair level [place] for transfers and min assist for w/c propulsion. Gait remains non-functional at this time as it requires max assist x2 from 2 skilled therapists. Patient's care partner is independent to provide the necessary physical assistance at discharge, pt's son and daughter.   Reasons goals not met: see above  Recommendation:  Patient will benefit from ongoing skilled PT services in home health setting to continue to advance safe functional mobility, address ongoing impairments in RLE strength, proprioception, and ROM, balance and NMR, and endurance, and minimize fall risk.  Equipment: 18x18 w/c, hospital bed  Reasons for discharge: treatment goals met and discharge from hospital  Patient/family agrees with progress made and goals achieved: Yes  PT Discharge Precautions/Restrictions Precautions Precautions: Fall Restrictions Weight Bearing Restrictions: No Vision/Perception  Vision - Assessment Additional Comments: Unable to formally assess 2/2 motor planning impairments, pt able to state time on clock  accurately Perception Perception: Impaired Inattention/Neglect: Does not attend to right side of body Praxis Praxis: Impaired Praxis Impairment Details: Initiation;Motor planning  Cognition Overall Cognitive Status: Impaired/Different from baseline Arousal/Alertness: Awake/alert Orientation Level: Oriented to situation;Oriented to person;Oriented to place Sustained Attention: Impaired Selective Attention: Impaired Memory: Impaired Memory Impairment: Storage deficit;Retrieval deficit;Decreased recall of new information Awareness: Impaired Problem Solving: Impaired Reasoning: Impaired Sequencing: Impaired Behaviors: Impulsive Safety/Judgment: Impaired Comments: decreased awareness of deficits, mild impulsivity  Sensation Sensation Light Touch: Impaired Detail Light Touch Impaired Details: Absent RLE;Absent RUE Coordination Gross Motor Movements are Fluid and Coordinated: No Fine Motor Movements are Fluid and Coordinated: No Heel Shin Test: Impaired RLE Motor  Motor Motor: Hemiplegia;Ataxia;Abnormal tone Motor - Discharge Observations: R hemi, increased quad, hamstring, and gastroc tone   Mobility Bed Mobility Bed Mobility: Rolling Right;Rolling Left;Supine to Sit;Sit to Supine Rolling Right: Minimal Assistance - Patient > 75% Rolling Left: Minimal Assistance - Patient > 75% Supine to Sit: Minimal Assistance - Patient > 75% Sit to Supine: Minimal Assistance - Patient > 75% Transfers Transfers: Sit to Stand;Stand to Sit;Stand Pivot Transfers Sit to Stand: Minimal Assistance - Patient > 75% Stand to Sit: Minimal Assistance - Patient > 75% Stand Pivot Transfers: Moderate Assistance - Patient 50 - 74% Stand Pivot Transfer Details: Manual facilitation for weight shifting;Manual facilitation for placement;Tactile cues for posture;Tactile cues for placement;Tactile cues for initiation;Verbal cues for technique;Verbal cues for sequencing Transfer (Assistive device):  None Locomotion  Gait Ambulation: No Gait Gait: No Stairs / Additional Locomotion Stairs: No Wheelchair Mobility Wheelchair Mobility: Yes Wheelchair Assistance: Minimal assistance - Patient >75% Wheelchair Propulsion: Left upper extremity;Left lower extremity Wheelchair Parts Management: Needs assistance Distance: 58'  Trunk/Postural Assessment  Cervical Assessment Cervical Assessment: Exceptions  to WFL(forward head ) Thoracic Assessment Thoracic Assessment: Exceptions to WFL(kyphotic) Lumbar Assessment Lumbar Assessment: Exceptions to WFL(posterior pelvic tilt) Postural Control Postural Control: Deficits on evaluation(impaired midline orientation, R lateral lean preference)  Balance Balance Balance Assessed: Yes Static Sitting Balance Static Sitting - Balance Support: No upper extremity supported;Feet supported Static Sitting - Level of Assistance: 5: Stand by assistance Dynamic Sitting Balance Dynamic Sitting - Balance Support: No upper extremity supported;Feet supported Dynamic Sitting - Level of Assistance: 4: Min assist Static Standing Balance Static Standing - Balance Support: During functional activity;Left upper extremity supported Static Standing - Level of Assistance: 4: Min assist Extremity Assessment  RLE Assessment RLE Assessment: Exceptions to [facility] Passive Range of Motion (PROM) Comments: increased stiffness globally, unable to reach neutral hip extension General Strength Comments: globally 4/5 LLE Assessment LLE Assessment: Within Functional Limits    Arlisha Patalano K Noriah Osgood 08/15/2018, 7:42 AM

## 2018-08-14 NOTE — Progress Notes (Signed)
Physical Therapy Session Note  Patient Details  Name: Evan [name] MRN: [id number] Date of Birth: Sep 17, 1927  Today's Date: 08/14/2018 PT Individual Time: 0900-0955 PT Individual Time Calculation (min): 55 min   Short Term Goals: Week 4:  PT Short Term Goal 1 (Week 4): =LTGs due to ELOS  Skilled Therapeutic Interventions/Progress Updates:   Pt in supine and agreeable to therapy, no c/o pain. Pt's son and daughter both present for hands-on education this session. Focused on assisting pt w/ bed mobility, bed positioning to maximize airflow, and functional transfers. Family providing appropriate physical assistance w/ donning pants and shoes in supine position and provided min assist for supine<>sit w/ verbal cues for technique from this therapist. Pt's son providing primary hands-on for all squat/stand pivot transfers w/ daughter providing 2nd set of hands-on to assist w/ blocking w/c, movement of hips, and watching for R foot placement. Family performed multiple transfers to/from bed and to/from car via this technique, verbal cues from therapist for technique. Son managing RLE appropriately as well. Educated on use of gait belt including positioning of belt and caregiver hand placement. Car transfers practiced at [place] height. Verbally discussed bed mobility and bed positioning for improved air flow in supine, provided w/ written handout of this information as well. Educated on w/c parts management, son and daughter returned demonstration. Discussed having pt sit on furniture at home that has firm cushioning and not using lift function on recliner until a home health therapist can evaluate its safety. Both verbalized understanding and in agreement w/ all education. Ended session in w/c and in care of family, all needs met.   Pt on RA throughout session, staying primarily >92% saturation. O2 went down to 86% during transfer, but quickly returned back to baseline. Educated on cues to improve breathing  including relaxation, decreasing breathing rate, and diaphragmatic breathing.   Therapy Documentation Precautions:  Precautions Precautions: Fall Precaution Comments: bruising along R side of neck Restrictions Weight Bearing Restrictions: No  Therapy/Group: Individual Therapy  Shakeyla Giebler Clent Demark 08/14/2018, 11:43 AM

## 2018-08-14 NOTE — Discharge Instructions (Signed)
Inpatient Rehab Discharge Instructions  Evan [name] Discharge date and time:    Activities/Precautions/ Functional Status: Activity: no lifting, driving, or strenuous exercise till cleared by MD Diet: Dysphagia #2 nectar thick liquids Wound Care: Routine skin checks   Functional status:  ___ No restrictions     ___ Walk up steps independently _X__ 24/7 supervision/assistance   ___ Walk up steps with assistance ___ Intermittent supervision/assistance  ___ Bathe/dress independently ___ Walk with walker     _X__ Bathe/dress with assistance ___ Walk Independently    ___ Shower independently ___ Walk with assistance    ___ Shower with assistance _X__ No alcohol     ___ Return to work/school ________      COMMUNITY REFERRALS UPON DISCHARGE:    Home Health:   PT     OT     ST    RN                     Agency:  [facility] Phone: [phone]   Medical Equipment/Items Ordered:  Wheelchair, walker, hospital bed and 3n1 commode                                                      Agency/Supplier:  [facility] @ [phone]      Special Instructions:  STROKE/TIA DISCHARGE INSTRUCTIONS SMOKING Cigarette smoking nearly doubles your risk of having a stroke & is the single most alterable risk factor  If you smoke or have smoked in the last 12 months, you are advised to quit smoking for your health.  Most of the excess cardiovascular risk related to smoking disappears within a year of stopping.  Ask you doctor about anti-smoking medications  [place] Quit Line: 1-800-QUIT NOW  Free Smoking Cessation Classes [phone]  CHOLESTEROL Know your levels; limit fat & cholesterol in your diet  Lipid Panel     Component Value Date/Time   CHOL 75 02/03/2018 0524   CHOL 112 01/19/2018 0929   CHOL 97 09/09/2013 0957   TRIG 60 02/03/2018 0524   TRIG 92 09/09/2013 0957   HDL 28 (L) 02/03/2018 0524   HDL 35 (L) 01/19/2018 0929   HDL 37 (L) 09/09/2013 0957   CHOLHDL 2.7  02/03/2018 0524   VLDL 12 02/03/2018 0524   LDLCALC 35 02/03/2018 0524   LDLCALC 59 01/19/2018 0929   LDLCALC 42 09/09/2013 0957      Many patients benefit from treatment even if their cholesterol is at goal.  Goal: Total Cholesterol (CHOL) less than 160  Goal:  Triglycerides (TRIG) less than 150  Goal:  HDL greater than 40  Goal:  LDL (LDLCALC) less than 100   BLOOD PRESSURE American Stroke Association blood pressure target is less that 120/80 mm/Hg  Your discharge blood pressure is:  BP: (!) 133/56  Monitor your blood pressure  Limit your salt and alcohol intake  Many individuals will require more than one medication for high blood pressure  DIABETES (A1c is a blood sugar average for last 3 months) Goal HGBA1c is under 7% (HBGA1c is blood sugar average for last 3 months)  Diabetes: No known diagnosis of diabetes    Lab Results  Component Value Date   HGBA1C 5.3 12/02/2016     Your HGBA1c can be lowered with medications, healthy diet, and exercise.  Check your blood sugar as directed by your physician  Call your physician if you experience unexplained or low blood sugars.  PHYSICAL ACTIVITY/REHABILITATION Goal is 30 minutes at least 4 days per week  Activity: Increase activity slowly, and No driving, Therapies: see above Return to work: N/a  Activity decreases your risk of heart attack and stroke and makes your heart stronger.  It helps control your weight and blood pressure; helps you relax and can improve your mood.  Participate in a regular exercise program.  Talk with your doctor about the best form of exercise for you (dancing, walking, swimming, cycling).  DIET/WEIGHT Goal is to maintain a healthy weight  Your discharge diet is:  Diet Order            DIET DYS 2 Room service appropriate? No; Fluid consistency: Nectar Thick  Diet effective now             liquids Your height is:  Height: 5\' 5"  (165.1 cm) Your current weight is: Weight: 73.7 kg Your  Body Mass Index (BMI) is:  BMI (Calculated): 27.04  Following the type of diet specifically designed for you will help prevent another stroke.  Your goal weight range :    Your goal Body Mass Index (BMI) is 19-24.  Healthy food habits can help reduce 3 risk factors for stroke:  High cholesterol, hypertension, and excess weight.  RESOURCES Stroke/Support Group:  Call [phone]   STROKE EDUCATION PROVIDED/REVIEWED AND GIVEN TO PATIENT Stroke warning signs and symptoms How to activate emergency medical system (call 911). Medications prescribed at discharge. Need for follow-up after discharge. Personal risk factors for stroke. Pneumonia vaccine given:  Flu vaccine given:  My questions have been answered, the writing is legible, and I understand these instructions.  I will adhere to these goals & educational materials that have been provided to me after my discharge from the hospital.     My questions have been answered and I understand these instructions. I will adhere to these goals and the provided educational materials after my discharge from the hospital.  Patient/Caregiver Signature _______________________________ Date __________  Clinician Signature _______________________________________ Date __________  Please bring this form and your medication list with you to all your follow-up doctor's appointments.

## 2018-08-14 NOTE — Progress Notes (Signed)
Speech Language Pathology Discharge Summary  Patient Details  Name: Evan [name] MRN: [id number] Date of Birth: 08/04/1927  Today's Date: 08/14/2018 SLP Individual Time: 1000-1045 SLP Individual Time Calculation (min): 45 min   Skilled Therapeutic Interventions:  Skilled treatment session focused on completion of patient and family education with the patient's son and daughter. Both were educated on patient's current swallowing function, diet recommendations, appropriate textures and compensatory strategies. Patient's family thickened all liquids to the appropriate consistencies and answered all questions in regards to appropriate textures. SLP also provided education in regards to patient's current cognitive functioning and strategies to utilize at home to maximize independence and overall safety, especially in regards to 24 hour supervision and developing a routine. They all verbalized understanding and handouts were given to reinforce information. Patient will discharge home tomorrow with family.   Patient has met 3 of 5 long term goals.  Patient to discharge at overall Mod;Max level.   Reasons goals not met: Patient continues to require Max-Total A for functional problem solving    Clinical Impression/Discharge Summary: Patient has made minimal and inconsistent gains and has met 3 of 5 LTGs this admission. Currently, patient is consuming Dys. 2 textures with nectar-thick liquids with minimal overt s/s of aspiration and Mod A verbal cues for use of swallowing compensatory strategies. Patient requires Mod A verbal and visual cues for orientation and attention but Max-Total A for functional problem solving. Patient and family education is complete and patient will discharge home with 24 hour supervision. Patient would benefit from f/u SLP services to maximize his swallowing and cognitive functioning in order to reduce caregiver burden.   Care Partner:  Caregiver Able to Provide Assistance: Yes   Type of Caregiver Assistance: Physical;Cognitive  Recommendation:  24 hour supervision/assistance;Home Health SLP  Rationale for SLP Follow Up: Reduce caregiver burden;Maximize cognitive function and independence;Maximize swallowing safety   Equipment: Thickener    Reasons for discharge: Discharged from hospital   Patient/Family Agrees with Progress Made and Goals Achieved: Yes    [name], [place] 08/14/2018, 2:38 PM

## 2018-08-14 NOTE — Plan of Care (Signed)
  Problem: Consults Goal: RH STROKE PATIENT EDUCATION Description See Patient Education module for education specifics  Outcome: Progressing Goal: Nutrition Consult-if indicated Outcome: Progressing Goal: Diabetes Guidelines if Diabetic/Glucose > 140 Description If diabetic or lab glucose is > 140 mg/dl - Initiate Diabetes/Hyperglycemia Guidelines & Document Interventions  Outcome: Progressing

## 2018-08-15 ENCOUNTER — Inpatient Hospital Stay (HOSPITAL_COMMUNITY): Payer: Medicare HMO | Admitting: Physical Therapy

## 2018-08-15 NOTE — Progress Notes (Signed)
Social Work  Discharge Note  The overall goal for the admission was met for:   Discharge location: Yes - home with family providing 24/7 assistance  Length of Stay: Yes - 30 days  Discharge activity level: NO - d/c'd at overall mod - max assist level  Home/community participation: No  Services provided included: MD, RD, PT, OT, SLP, RN, Pharmacy and [place]: [employer]  Follow-up services arranged: Home Health: RN, PT, OT, ST via [place], DME: 18x18 lightweight w/c with cushion, rolling walker, hospital bed and 3n1 commode via [place] and Patient/Family has no preference for HH/DME agencies  Comments (or additional information):         Contact info:  Daughter, Andreas Blower @ [phone]                  [place], [place] @ [phone]  Patient/Family verbalized understanding of follow-up arrangements: Yes and they are planning to hire private duty caregiver as well.  Individual responsible for coordination of the follow-up plan: daughter  Confirmed correct DME delivered: Lennart Pall 08/15/2018    Amarissa Koerner

## 2018-08-15 NOTE — Progress Notes (Signed)
Pt.'s family got d/c instructions and equipment,pt. Is ready to go home with his family.

## 2018-08-16 ENCOUNTER — Telehealth: Payer: Self-pay | Admitting: Registered Nurse

## 2018-08-16 ENCOUNTER — Telehealth: Payer: Self-pay | Admitting: *Deleted

## 2018-08-16 DIAGNOSIS — Z86718 Personal history of other venous thrombosis and embolism: Secondary | ICD-10-CM | POA: Diagnosis not present

## 2018-08-16 DIAGNOSIS — I2510 Atherosclerotic heart disease of native coronary artery without angina pectoris: Secondary | ICD-10-CM | POA: Diagnosis not present

## 2018-08-16 DIAGNOSIS — N183 Chronic kidney disease, stage 3 (moderate): Secondary | ICD-10-CM | POA: Diagnosis not present

## 2018-08-16 DIAGNOSIS — I13 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 failure and stage 1 through stage 4 chronic kidney disease, or unspecified chronic kidney disease: Secondary | ICD-10-CM | POA: Diagnosis not present

## 2018-08-16 DIAGNOSIS — I4891 Unspecified atrial fibrillation: Secondary | ICD-10-CM | POA: Diagnosis not present

## 2018-08-16 DIAGNOSIS — I69218 Other symptoms and signs involving cognitive functions following other nontraumatic intracranial hemorrhage: Secondary | ICD-10-CM | POA: Diagnosis not present

## 2018-08-16 DIAGNOSIS — I509 Heart failure, unspecified: Secondary | ICD-10-CM | POA: Diagnosis not present

## 2018-08-16 DIAGNOSIS — C3431 Malignant neoplasm of lower lobe, right bronchus or lung: Secondary | ICD-10-CM | POA: Diagnosis not present

## 2018-08-16 DIAGNOSIS — J449 Chronic obstructive pulmonary disease, unspecified: Secondary | ICD-10-CM | POA: Diagnosis not present

## 2018-08-16 NOTE — Telephone Encounter (Signed)
Malachy Mood RN called to inquire about the MD who will signing orders. I have given her  Dr Serita Grit name who will sign HH orders. She reported about him having a stg 2 pressure sore on his heel and they have a foam dressing on it and it is draining some.. She is asking if she can order condom caths for him since he is having so much trouble getting up and down.  Please advise on heel wound care and cc request.

## 2018-08-16 NOTE — Telephone Encounter (Signed)
It should be evaluated.  It was not draining in the hospital.  He should have a PRAFO.  We can order a condom cath.  Thanks.

## 2018-08-16 NOTE — Telephone Encounter (Signed)
Transitional Care call Transitional Questions answerd by daughter [name]   Patient name: Evan [name]  DOB: March 26, 1928 1. Are you/is patient experiencing any problems since coming home? No a. Are there any questions regarding any aspect of care? No 2. Are there any questions regarding medications administration/dosing? No a. Are meds being taken as prescribed? Yes b. "Patient should review meds with caller to confirm" Medication List Reviewed 3. Have there been any falls? No 4. Has Home Health been to the house and/or have they contacted you? Yes, [facility]  a. If not, have you tried to contact them? NA b. Can we help you contact them? NA 5. Are bowels and bladder emptying properly? Yes a. Are there any unexpected incontinence issues? No b. If applicable, is patient following bowel/bladder programs? NA 6. Any fevers, problems with breathing, unexpected pain? [name] reports there was a time when [name] was SOB, he was evaluated by [place]. She was instructed to take him to the emergency room to be evaluated if he develops SOB, she verbalizes understanding.  7. Are there any skin problems or new areas of breakdown? No 8. Has the patient/family member arranged specialty MD follow up (ie cardiology/neurology/renal/surgical/etc.)?  [name] states [name] Cardiologist is [name], she was instructed to call office and schedule a HFU appointment she verbalizes understanding. She will be calling to schedule HFU appointment with Nephrology she states.  a. Can we help arrange? NA 9. Does the patient need any other services or support that we can help arrange? No 10. Are caregivers following through as expected in assisting the patient? Yes 11. Has the patient quit smoking, drinking alcohol, or using drugs as recommended? (                        )  Appointment date/time 08/20/2018 at 11:20, telephone visit with Dr. Posey Pronto. Spoke with Dr. Posey Pronto regarding [name] and he  agrees with telephone visit.   At [place]

## 2018-08-17 ENCOUNTER — Telehealth: Payer: Self-pay

## 2018-08-17 DIAGNOSIS — I69218 Other symptoms and signs involving cognitive functions following other nontraumatic intracranial hemorrhage: Secondary | ICD-10-CM | POA: Diagnosis not present

## 2018-08-17 DIAGNOSIS — J449 Chronic obstructive pulmonary disease, unspecified: Secondary | ICD-10-CM | POA: Diagnosis not present

## 2018-08-17 DIAGNOSIS — N183 Chronic kidney disease, stage 3 (moderate): Secondary | ICD-10-CM | POA: Diagnosis not present

## 2018-08-17 DIAGNOSIS — I4891 Unspecified atrial fibrillation: Secondary | ICD-10-CM | POA: Diagnosis not present

## 2018-08-17 DIAGNOSIS — I13 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 failure and stage 1 through stage 4 chronic kidney disease, or unspecified chronic kidney disease: Secondary | ICD-10-CM | POA: Diagnosis not present

## 2018-08-17 DIAGNOSIS — I2510 Atherosclerotic heart disease of native coronary artery without angina pectoris: Secondary | ICD-10-CM | POA: Diagnosis not present

## 2018-08-17 DIAGNOSIS — C3431 Malignant neoplasm of lower lobe, right bronchus or lung: Secondary | ICD-10-CM | POA: Diagnosis not present

## 2018-08-17 DIAGNOSIS — Z86718 Personal history of other venous thrombosis and embolism: Secondary | ICD-10-CM | POA: Diagnosis not present

## 2018-08-17 DIAGNOSIS — I509 Heart failure, unspecified: Secondary | ICD-10-CM | POA: Diagnosis not present

## 2018-08-17 NOTE — Telephone Encounter (Signed)
Malachy Mood looked at it and it is about a dime size area that was draining.  She put a foam back on it and will  Look at it again and if still draining will call for further wound orders.  He has a PRAFO.  He can hardly get up and down out of bed and would be difficult to get into the office for evaluation.

## 2018-08-17 NOTE — Telephone Encounter (Signed)
Bethena Roys, ST from [facility] called requesting verbal orders for HHST 1wk1, 2wk4. Orders approved and given.

## 2018-08-17 NOTE — Telephone Encounter (Signed)
He should have assistance at home.  SNF was suggested, but family wished to take patient home and stated they had adequate support. Thanks.

## 2018-08-18 DIAGNOSIS — I2510 Atherosclerotic heart disease of native coronary artery without angina pectoris: Secondary | ICD-10-CM | POA: Diagnosis not present

## 2018-08-18 DIAGNOSIS — J449 Chronic obstructive pulmonary disease, unspecified: Secondary | ICD-10-CM | POA: Diagnosis not present

## 2018-08-18 DIAGNOSIS — I13 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 failure and stage 1 through stage 4 chronic kidney disease, or unspecified chronic kidney disease: Secondary | ICD-10-CM | POA: Diagnosis not present

## 2018-08-18 DIAGNOSIS — N183 Chronic kidney disease, stage 3 (moderate): Secondary | ICD-10-CM | POA: Diagnosis not present

## 2018-08-18 DIAGNOSIS — C3431 Malignant neoplasm of lower lobe, right bronchus or lung: Secondary | ICD-10-CM | POA: Diagnosis not present

## 2018-08-18 DIAGNOSIS — Z86718 Personal history of other venous thrombosis and embolism: Secondary | ICD-10-CM | POA: Diagnosis not present

## 2018-08-18 DIAGNOSIS — I509 Heart failure, unspecified: Secondary | ICD-10-CM | POA: Diagnosis not present

## 2018-08-18 DIAGNOSIS — I4891 Unspecified atrial fibrillation: Secondary | ICD-10-CM | POA: Diagnosis not present

## 2018-08-18 DIAGNOSIS — I69218 Other symptoms and signs involving cognitive functions following other nontraumatic intracranial hemorrhage: Secondary | ICD-10-CM | POA: Diagnosis not present

## 2018-08-20 ENCOUNTER — Encounter: Payer: Medicare HMO | Attending: Physical Medicine & Rehabilitation | Admitting: Physical Medicine & Rehabilitation

## 2018-08-20 ENCOUNTER — Encounter: Payer: Self-pay | Admitting: Physical Medicine & Rehabilitation

## 2018-08-20 ENCOUNTER — Other Ambulatory Visit: Payer: Self-pay

## 2018-08-20 DIAGNOSIS — I610 Nontraumatic intracerebral hemorrhage in hemisphere, subcortical: Secondary | ICD-10-CM | POA: Diagnosis not present

## 2018-08-20 DIAGNOSIS — L989 Disorder of the skin and subcutaneous tissue, unspecified: Secondary | ICD-10-CM

## 2018-08-20 DIAGNOSIS — I69218 Other symptoms and signs involving cognitive functions following other nontraumatic intracranial hemorrhage: Secondary | ICD-10-CM | POA: Diagnosis not present

## 2018-08-20 DIAGNOSIS — N183 Chronic kidney disease, stage 3 (moderate): Secondary | ICD-10-CM | POA: Diagnosis not present

## 2018-08-20 DIAGNOSIS — R269 Unspecified abnormalities of gait and mobility: Secondary | ICD-10-CM | POA: Insufficient documentation

## 2018-08-20 DIAGNOSIS — I69391 Dysphagia following cerebral infarction: Secondary | ICD-10-CM

## 2018-08-20 DIAGNOSIS — Z85118 Personal history of other malignant neoplasm of bronchus and lung: Secondary | ICD-10-CM

## 2018-08-20 DIAGNOSIS — I4891 Unspecified atrial fibrillation: Secondary | ICD-10-CM | POA: Diagnosis not present

## 2018-08-20 DIAGNOSIS — I13 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 failure and stage 1 through stage 4 chronic kidney disease, or unspecified chronic kidney disease: Secondary | ICD-10-CM | POA: Diagnosis not present

## 2018-08-20 DIAGNOSIS — Z86718 Personal history of other venous thrombosis and embolism: Secondary | ICD-10-CM | POA: Diagnosis not present

## 2018-08-20 DIAGNOSIS — J700 Acute pulmonary manifestations due to radiation: Secondary | ICD-10-CM

## 2018-08-20 DIAGNOSIS — I2510 Atherosclerotic heart disease of native coronary artery without angina pectoris: Secondary | ICD-10-CM | POA: Diagnosis not present

## 2018-08-20 DIAGNOSIS — I509 Heart failure, unspecified: Secondary | ICD-10-CM | POA: Diagnosis not present

## 2018-08-20 DIAGNOSIS — C3431 Malignant neoplasm of lower lobe, right bronchus or lung: Secondary | ICD-10-CM | POA: Diagnosis not present

## 2018-08-20 DIAGNOSIS — J449 Chronic obstructive pulmonary disease, unspecified: Secondary | ICD-10-CM | POA: Diagnosis not present
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
## 2018-08-20 NOTE — Discharge Summary (Addendum)
Physician Discharge Summary  Patient ID: ARRICK DUTTON MRN: [id number] DOB/AGE: [birth year] 85 y.o.  Admit date: 07/16/2018 Discharge date: 08/15/2018  Discharge Diagnoses:  Principal Problem:   Thalamic hemorrhage ([place]) Active Problems:   History of lung cancer   Depression   CKD (chronic kidney disease), stage III (HCC)   Acute blood loss anemia   SOB (shortness of breath)   Hyperglycemia   Fatigue   Anxiety state   Supplemental oxygen dependent   Dysphagia, post-stroke   Prolonged Q-T interval on ECG   Discharged Condition: Stable   Significant Diagnostic Studies: Ct Head Wo Contrast  Result Date: 07/30/2018 CLINICAL DATA:  85 year old male with history of stroke. Follow-up study. EXAM: CT HEAD WITHOUT CONTRAST TECHNIQUE: Contiguous axial images were obtained from the base of the skull through the vertex without intravenous contrast. COMPARISON:  Head CT 07/23/2018. FINDINGS: Brain: Previously noted high attenuation focus centered in the left side of the thalamus has decreased in size, currently measuring 10 x 18 x 16 mm (axial image 17 of series 3 and coronal image 44 of series 5), with persistent surrounding decreased attenuation in the adjacent parenchyma, compatible with some adjacent edema. This results in only minimal local mass effect. No new sites of hemorrhage are noted. No hydrocephalus. No extra-axial fluid collections. No midline shift or signs of herniation. Mild cerebral atrophy. Patchy and confluent areas of decreased attenuation are noted throughout the deep and periventricular white matter of the cerebral hemispheres bilaterally, compatible with chronic microvascular ischemic disease. Vascular: No hyperdense vessel or unexpected calcification. Skull: Normal. Negative for fracture or focal lesion. Sinuses/Orbits: Mucoperiosteal thickening and volume loss in the left maxillary sinus related to chronic sinusitis. Status post left maxillary antrectomy. Status post right  mastoidectomy. Other: None. IMPRESSION: 1. Slight decreased size of hemorrhagic infarct in the left thalamus with small amount of surrounding edema and mild local mass effect, which also appear to be slightly improved. No new acute findings. 2. Mild cerebral atrophy with chronic microvascular ischemic changes in the cerebral white matter. 3. Sequela of chronic left maxillary sinusitis. Electronically Signed   By: Vinnie Langton M.D.   On: 07/30/2018 08:08   Ct Head Wo Contrast  Result Date: 07/23/2018 CLINICAL DATA:  85 year old male with left thalamic hemorrhage. EXAM: CT HEAD WITHOUT CONTRAST TECHNIQUE: Contiguous axial images were obtained from the base of the skull through the vertex without intravenous contrast. COMPARISON:  07/15/2018 and earlier. FINDINGS: Brain: Oval hyperdense left thalamic hemorrhage is slowly fading and encompasses 16 x 23 x 19 mm (stable). Regional edema and mild mass effect have not significantly changed. No intraventricular extension. No other intracranial blood products identified. Elsewhere Stable gray-white matter differentiation throughout the brain. No evidence of acute cortically based infarct. Vascular: Calcified atherosclerosis at the skull base. No suspicious intracranial vascular hyperdensity. Skull: No acute osseous abnormality identified. Sinuses/Orbits: Chronic left maxillary sinusitis and previous right mastoidectomy. Other: No acute orbit or scalp soft tissue finding. Calcified scalp atherosclerosis. IMPRESSION: 1. Evolving left thalamic hemorrhage with stable size and configuration since 07/15/2018. 2. No new intracranial abnormality. Electronically Signed   By: Genevie Ann M.D.   On: 07/23/2018 08:38   Ct Angio Chest Pe W Or Wo Contrast  Result Date: 07/21/2018 CLINICAL DATA:  Shortness of breath. Clinical concern for pulmonary embolus. EXAM: CT ANGIOGRAPHY CHEST WITH CONTRAST TECHNIQUE: Multidetector CT imaging of the chest was performed using the standard  protocol during bolus administration of intravenous contrast. Multiplanar CT image reconstructions and MIPs were  obtained to evaluate the vascular anatomy. CONTRAST:  65mL OMNIPAQUE IOHEXOL 350 MG/ML SOLN COMPARISON:  06/01/2018 FINDINGS: Cardiovascular: The heart size is normal. No substantial pericardial effusion. Coronary artery calcification is evident. Atherosclerotic calcification is noted in the wall of the thoracic aorta. No filling defects in the opacified pulmonary arteries to suggest the presence of an acute pulmonary embolus. Mediastinum/Nodes: 9 mm short axis lymph node measured in the region of the right thoracic inlet previously is 10 mm today. 10 mm short axis subcarinal lymph node is stable. Abnormal soft tissue in the inferior right hilum was not well demonstrated on previous noncontrast exam but appears progressive when comparing to 02/02/2018. No evidence for left hilar lymphadenopathy. Lungs/Pleura: Secretions/debris noted in the lower trachea and right mainstem bronchus. Patchy ground-glass attenuation in the right midlung this similar to prior. Posterior right lower lobe collapse/consolidation is mildly progressive in the interval. Atelectatic changes noted posterior left lower lobe. Tiny perifissural nodule in the left lung (62/8) is stable. Tiny right pleural effusion evident. Upper Abdomen: Unremarkable. Musculoskeletal: No worrisome lytic or sclerotic osseous abnormality. Review of the MIP images confirms the above findings. IMPRESSION: 1. No CT evidence for acute pulmonary embolus. 2. Interval progression of abnormal soft tissue in the inferior right hilum. Recurrent disease a concern. 3. Similar to mild progression of posterior right lower lobe collapse/consolidative change, likely radiation scarring. 4. Stable mediastinal lymph nodes. 5.  Aortic Atherosclerois (ICD10-170.0) Electronically Signed   By: Misty Stanley M.D.   On: 07/21/2018 19:51   Dg Chest Port 1 View  Result Date:  08/02/2018 CLINICAL DATA:  Atelectasis. EXAM: PORTABLE CHEST 1 VIEW COMPARISON:  CT chest and chest x-ray dated July 21, 2018. FINDINGS: Stable cardiomediastinal silhouette. Atherosclerotic calcification of the aortic arch. Mildly increased interstitial markings at the lung bases are similar to prior study. Unchanged mild bibasilar atelectasis and trace bilateral pleural effusions. No consolidation or pneumothorax. No acute osseous abnormality. IMPRESSION: 1. Similar appearing bibasilar atelectasis, trace pleural effusions, and mild interstitial edema. Electronically Signed   By: Titus Dubin M.D.   On: 08/02/2018 11:11    Labs:  Basic Metabolic Panel: BMP Latest Ref Rng & Units 08/13/2018 08/08/2018 08/06/2018  Glucose 70 - 99 mg/dL 120(H) 117(H) 121(H)  BUN 8 - 23 mg/dL 20 25(H) 25(H)  Creatinine 0.61 - 1.24 mg/dL 1.21 1.30(H) 1.27(H)  BUN/Creat Ratio 6 - 22 (calc) - - -  Sodium 135 - 145 mmol/L 136 133(L) 133(L)  Potassium 3.5 - 5.1 mmol/L 4.2 4.2 3.9  Chloride 98 - 111 mmol/L 103 102 101  CO2 22 - 32 mmol/L 23 20(L) 20(L)  Calcium 8.9 - 10.3 mg/dL 9.0 8.8(L) 8.7(L)    CBC: CBC Latest Ref Rng & Units 08/13/2018 08/10/2018 08/08/2018  WBC 4.0 - 10.5 K/uL 8.2 7.1 -  Hemoglobin 13.0 - 17.0 g/dL 11.0(L) 10.3(L) -  Hematocrit 39.0 - 52.0 % 33.5(L) 30.5(L) -  Platelets 150 - 400 K/uL 183 125(L) 120(L)      Brief HPI:   Gaynelle Arabian. Youtz is an 85 year old male with history of CAD, RA, RLL adenocarcinoma, possible radiation pneumonitis, A. fib, DVT/PE on treatment dose Lovenox with multiple recent admissions for RUE hematoma/cellulitis and chronic thrombocytopenia who was admitted on 07/14/2018 after being found on the floor with difficulty moving right side.  CT head revealed acute left thalamic intraparenchymal hemorrhage felt to be likely hypertensive in setting of coagulopathy.  Lovenox was placed on hold.  Dopplers of lower extremity revealed chronic left peroneal vein DVT  therefore no need for IVC  filter.  CTA head/neck showed atheromatous change about carotid bifurcation with 50% narrowing on right and 60% narrowing on left as well as mildly enlarged right paratracheal adenopathy.  Facial CT showed soft tissue contusion with 3.4 cm hematoma right anterior face and neck.  CT cervical spine was negative for injury.  Follow-up CT head./12 showed continued hemorrhage and neurology recommended holding anticoagulation.  He was started on dysphagia 2 diet and noted to have significant cognitive impairments as well as hemiplegia affecting mobility and ADLs.  CIR was recommended for follow-up therapy   Hospital Course: TINO RONAN was admitted to rehab 07/16/2018 for inpatient therapies to consist of PT, ST and OT at least three hours five days a week. Past admission physiatrist, therapy team and rehab RN have worked together to provide customized collaborative inpatient rehab. His p.o. intake was noted to be poor and he required much encouragement as well as multiple nutritional supplements to maintain adequate nutritional status.  He was maintained on modified diet due to dysphagia and this was advanced to dysphagia 2/10 by discharge.  He was briefly treated with IV fluids due to worsening of renal status.  He has also had high levels of anxiety with reports of hypoxia and poor activity tolerance.  Abrasion RUE has been treated with Aquacel and foam dressing to absorb moisture.  PRAFO ordered for pressure relief measures.  CTA chest was done on 4/18 due to ongoing respiratory complaints and showed no evidence of acute PE as well as interval progression of abnormal soft tissue in inferior right hilum with concerns of recurrent disease and similar mild progression in posterior right lower lobe with collapse consolidative changes.  He was briefly treated with steroid taper with transient improvement.  Pulmonary was consulted for input on respiratory issues and felt that shortness of breath was likely due to  radiation pneumonitis as well as progressive disease and recommended palliative care to help discuss goals of care.  Follow-up chest x-ray of 430 showed mild interstitial edema therefore low-dose Lasix was added on 5/1.  Multiple measures used to help respiratory status including scheduling nebulizers, addition of Mucinex DM, upright posture as well as attempts at incentive spirometry to help with pulmonary hygiene.    Patient's participation as well as respiratory symptoms have waxed and waned during his rehab stay.   CT of the head was repeated on 4/20 showing minimal improvement in hemorrhage therefore was repeated on 4/27 showing some improvement.  Dr. Leonie Man recommended starting low-dose ASA and patient to follow-up in office in 4 weeks for repeat CT head and input on anticoagulation in the future.  Serial CBC has been monitored showing H&H to be relatively stable but he did have transient drop in platelets to 120 on 5/6.  Follow-up CBC showed improvement in platelets to 183 and and no signs of bleeding noted.   Multiple discussions were held with daughter regarding concerns of resuming anticoagulation, recommendations from neurology and pulmonary care as well as offer of palliative care to help discuss GOC however she declined this.  Xanax was scheduled and increased slowly to 0.75 mg to avoid sedating effects.  Prozac was titrated upwards to 40 mg however EKG 05/07 revealed critical QT interval of 546 therefore this was discontinued and follow up EKG prior to discharge showed decrease in QT down to 418.  Blood pressure and heart rate are reasonably controlled on current regimen of Lasix and low-dose metoprolol twice daily.  He has made  slow progress during his rehab stay and is currently at mod to max assist overall.  Family education has been completed and the plan to hire private duty caregivers as well.  He will continue to receive further follow-up [facility] PT, OT, ST, and RN by [facility]  after discharge   hab course: During patient's stay in rehab weekly team conferences were h [facility] to monitor patient's progress, set goals and discuss barriers to discharge. At admission, patient required total assist with basic self-care task and max assist with mobility.  He exhibited moderate to severe cognitive impairments and had difficulty following multistep commands in part due to severe hearing loss, decreased attention and poor recall. He has had improvement in activity tolerance, balance, postural control as well as ability to compensate for deficits.  He requires mod to max assist for ADL tasks, min assist for supine to sitting and mod assist for stand pivot transfers.  He requires total assist for car transfers.  He requires min assist to propel his wheelchair.  Gait remains nonfunctional at this time.  He has made minimal to inconsistent cognitive gains.  He he remains on dysphagia 2 textures with nectar thick liquids with minimal aspiration signs.  He requires mod verbal cues for use of compensatory swallow strategies.  He requires mod verbal and visual cues for orientation and attention as well as max to total assist for functional problem-solving.  Family education was completed with daughters regarding all aspects of safety, cognitive assistance and physical assistance.     Disposition: Home  Diet: Dysphagia 2, nectar liquids  Special Instructions: 1.  Needs supervision with meals. 2.  [facility] to provide PT, OT, ST and RN  Discharge Instructions    Ambulatory referral to Physical Medicine Rehab   Complete by:  As directed    1-2 weeks transitional care appt     Allergies as of 08/15/2018      Reactions   Feldene [piroxicam] Other (See Comments)   REACTION: Blisters   Statins Other (See Comments)   Myalgias   Doxycycline Other (See Comments)   REACTION:  unknown   Latex Rash   Tequin Anxiety   Valium Other (See Comments)   REACTION:  unknown       Medication List    STOP taking these medications   cephALEXin 500 MG capsule Commonly known as:  KEFLEX   Cholecalciferol 50 MCG (2000 UT) Tabs   Ensure   FLUoxetine 20 MG tablet Commonly known as:  PROZAC   hydrOXYzine 25 MG tablet Commonly known as:  ATARAX/VISTARIL   senna-docusate 8.6-50 MG tablet Commonly known as:  Senokot-S     TAKE these medications   acetaminophen 325 MG tablet Commonly known as:  TYLENOL Take 1-2 tablets (325-650 mg total) by mouth every 4 (four) hours as needed for mild pain. What changed:    how much to take  reasons to take this   aspirin 81 MG EC tablet Take 1 tablet (81 mg total) by mouth daily.   camphor-menthol lotion Commonly known as:  SARNA Apply topically as needed for itching. What changed:    how much to take  when to take this   cycloSPORINE 0.05 % ophthalmic emulsion Commonly known as:  RESTASIS Place 1 drop into both eyes 2 (two) times daily.   furosemide 20 MG tablet Commonly known as:  LASIX Take 1 tablet (20 mg total) by mouth daily.   Gerhardt's butt cream Crea Apply 5  application topically 2 (two) times daily.   LORazepam 0.5 MG tablet Commonly known as:  ATIVAN Take 1.5 tablets (0.75 mg total) by mouth 3 (three) times daily. What changed:  how much to take   magnesium oxide 400 (241.3 Mg) MG tablet Commonly known as:  MAG-OX Take 1 tablet (400 mg total) by mouth 2 (two) times daily.   metoprolol tartrate 25 MG tablet Commonly known as:  LOPRESSOR Take 0.5 tablets (12.5 mg total) by mouth 2 (two) times daily.   nitroGLYCERIN 0.4 MG SL tablet Commonly known as:  NITROSTAT Place 1 tablet (0.4 mg total) under the tongue every 5 (five) minutes as needed for chest pain.   pantoprazole 40 MG tablet Commonly known as:  PROTONIX Take 1 tablet (40 mg total) by mouth every other day.   polyethylene glycol 17 g packet Commonly known as:  MIRALAX / GLYCOLAX Take 17 g by mouth daily as needed for mild  constipation.   rosuvastatin 5 MG tablet Commonly known as:  CRESTOR Take 1 tablet (5 mg total) by mouth daily at 6 PM.   tamsulosin 0.4 MG Caps capsule Commonly known as:  FLOMAX Take 1 capsule (0.4 mg total) by mouth daily.   Xiidra 5 % Soln Generic drug:  Lifitegrast Place 1-2 drops into both eyes daily.      Follow-up Information    Jamse Arn, MD Follow up.   Specialty:  Physical Medicine and Rehabilitation Why:  office will call you with follow up appoitnment Contact information: [place] [address] [phone]        [facility]. Call.   Specialty:  Neurology Why:  for follow up appointment --follow up CT and decision regarding blood thinner Contact information: [address] [place]       [name], MD. Call.   Specialty:  Family Medicine Why:  for follow up appointment Contact information: [address] [place] [address] [phone]        Jettie Booze, MD .   Specialties:  Cardiology, Radiology, Interventional Cardiology Contact information: [address] [place] [address] [phone]           Signed: Bary Leriche 08/20/2018, 6:34 PM Patient seen and examined by me on day of discharge. Delice Lesch, MD, ABPMR

## 2018-08-21 ENCOUNTER — Other Ambulatory Visit: Payer: Self-pay

## 2018-08-21 ENCOUNTER — Telehealth: Payer: Self-pay | Admitting: Family Medicine

## 2018-08-21 ENCOUNTER — Telehealth: Payer: Self-pay | Admitting: *Deleted

## 2018-08-21 ENCOUNTER — Emergency Department (HOSPITAL_COMMUNITY): Payer: Medicare HMO

## 2018-08-21 ENCOUNTER — Inpatient Hospital Stay (HOSPITAL_COMMUNITY)
Admission: EM | Admit: 2018-08-21 | Discharge: 2018-08-24 | DRG: 689 | Disposition: A | Discharge status: Home Health | Payer: Medicare HMO | Attending: Internal Medicine | Admitting: Internal Medicine

## 2018-08-21 DIAGNOSIS — R0600 Dyspnea, unspecified: Secondary | ICD-10-CM

## 2018-08-21 DIAGNOSIS — R9431 Abnormal electrocardiogram [ECG] [EKG]: Secondary | ICD-10-CM | POA: Diagnosis not present

## 2018-08-21 DIAGNOSIS — Z951 Presence of aortocoronary bypass graft: Secondary | ICD-10-CM | POA: Diagnosis not present

## 2018-08-21 DIAGNOSIS — Z1159 Encounter for screening for other viral diseases: Secondary | ICD-10-CM

## 2018-08-21 DIAGNOSIS — Z86711 Personal history of pulmonary embolism: Secondary | ICD-10-CM

## 2018-08-21 DIAGNOSIS — N400 Enlarged prostate without lower urinary tract symptoms: Secondary | ICD-10-CM | POA: Diagnosis present

## 2018-08-21 DIAGNOSIS — Z8673 Personal history of transient ischemic attack (TIA), and cerebral infarction without residual deficits: Secondary | ICD-10-CM

## 2018-08-21 DIAGNOSIS — Z9841 Cataract extraction status, right eye: Secondary | ICD-10-CM | POA: Diagnosis not present

## 2018-08-21 DIAGNOSIS — N183 Chronic kidney disease, stage 3 (moderate): Secondary | ICD-10-CM | POA: Diagnosis not present

## 2018-08-21 DIAGNOSIS — R4182 Altered mental status, unspecified: Secondary | ICD-10-CM

## 2018-08-21 DIAGNOSIS — I129 Hypertensive chronic kidney disease with stage 1 through stage 4 chronic kidney disease, or unspecified chronic kidney disease: Secondary | ICD-10-CM | POA: Diagnosis present

## 2018-08-21 DIAGNOSIS — I5032 Chronic diastolic (congestive) heart failure: Secondary | ICD-10-CM | POA: Diagnosis not present

## 2018-08-21 DIAGNOSIS — N182 Chronic kidney disease, stage 2 (mild): Secondary | ICD-10-CM | POA: Diagnosis present

## 2018-08-21 DIAGNOSIS — Z888 Allergy status to other drugs, medicaments and biological substances status: Secondary | ICD-10-CM

## 2018-08-21 DIAGNOSIS — K219 Gastro-esophageal reflux disease without esophagitis: Secondary | ICD-10-CM | POA: Diagnosis present

## 2018-08-21 DIAGNOSIS — C3431 Malignant neoplasm of lower lobe, right bronchus or lung: Secondary | ICD-10-CM | POA: Diagnosis not present

## 2018-08-21 DIAGNOSIS — N390 Urinary tract infection, site not specified: Principal | ICD-10-CM

## 2018-08-21 DIAGNOSIS — E869 Volume depletion, unspecified: Secondary | ICD-10-CM | POA: Diagnosis present

## 2018-08-21 DIAGNOSIS — I2510 Atherosclerotic heart disease of native coronary artery without angina pectoris: Secondary | ICD-10-CM | POA: Diagnosis not present

## 2018-08-21 DIAGNOSIS — J449 Chronic obstructive pulmonary disease, unspecified: Secondary | ICD-10-CM | POA: Diagnosis not present

## 2018-08-21 DIAGNOSIS — E785 Hyperlipidemia, unspecified: Secondary | ICD-10-CM | POA: Diagnosis not present

## 2018-08-21 DIAGNOSIS — R0902 Hypoxemia: Secondary | ICD-10-CM | POA: Diagnosis not present

## 2018-08-21 DIAGNOSIS — Z8719 Personal history of other diseases of the digestive system: Secondary | ICD-10-CM | POA: Diagnosis not present

## 2018-08-21 DIAGNOSIS — Z7982 Long term (current) use of aspirin: Secondary | ICD-10-CM

## 2018-08-21 DIAGNOSIS — I509 Heart failure, unspecified: Secondary | ICD-10-CM | POA: Diagnosis not present

## 2018-08-21 DIAGNOSIS — J189 Pneumonia, unspecified organism: Secondary | ICD-10-CM | POA: Diagnosis not present

## 2018-08-21 DIAGNOSIS — Z881 Allergy status to other antibiotic agents status: Secondary | ICD-10-CM

## 2018-08-21 DIAGNOSIS — G9341 Metabolic encephalopathy: Secondary | ICD-10-CM | POA: Diagnosis present

## 2018-08-21 DIAGNOSIS — F419 Anxiety disorder, unspecified: Secondary | ICD-10-CM | POA: Diagnosis not present

## 2018-08-21 DIAGNOSIS — B9620 Unspecified Escherichia coli [E. coli] as the cause of diseases classified elsewhere: Secondary | ICD-10-CM | POA: Diagnosis present

## 2018-08-21 DIAGNOSIS — Z9842 Cataract extraction status, left eye: Secondary | ICD-10-CM

## 2018-08-21 DIAGNOSIS — I4821 Permanent atrial fibrillation: Secondary | ICD-10-CM | POA: Diagnosis present

## 2018-08-21 DIAGNOSIS — Z961 Presence of intraocular lens: Secondary | ICD-10-CM | POA: Diagnosis present

## 2018-08-21 DIAGNOSIS — Z86718 Personal history of other venous thrombosis and embolism: Secondary | ICD-10-CM | POA: Diagnosis not present

## 2018-08-21 DIAGNOSIS — G459 Transient cerebral ischemic attack, unspecified: Secondary | ICD-10-CM | POA: Diagnosis not present

## 2018-08-21 DIAGNOSIS — F329 Major depressive disorder, single episode, unspecified: Secondary | ICD-10-CM | POA: Diagnosis present

## 2018-08-21 DIAGNOSIS — Z720 Tobacco use: Secondary | ICD-10-CM

## 2018-08-21 DIAGNOSIS — I480 Paroxysmal atrial fibrillation: Secondary | ICD-10-CM | POA: Diagnosis not present

## 2018-08-21 DIAGNOSIS — I610 Nontraumatic intracerebral hemorrhage in hemisphere, subcortical: Secondary | ICD-10-CM | POA: Diagnosis not present

## 2018-08-21 DIAGNOSIS — Z79899 Other long term (current) drug therapy: Secondary | ICD-10-CM

## 2018-08-21 DIAGNOSIS — R0602 Shortness of breath: Secondary | ICD-10-CM | POA: Diagnosis not present

## 2018-08-21 DIAGNOSIS — H9190 Unspecified hearing loss, unspecified ear: Secondary | ICD-10-CM | POA: Diagnosis not present

## 2018-08-21 DIAGNOSIS — R069 Unspecified abnormalities of breathing: Secondary | ICD-10-CM | POA: Diagnosis not present

## 2018-08-21 DIAGNOSIS — G8929 Other chronic pain: Secondary | ICD-10-CM | POA: Diagnosis present

## 2018-08-21 DIAGNOSIS — Z955 Presence of coronary angioplasty implant and graft: Secondary | ICD-10-CM

## 2018-08-21 DIAGNOSIS — I10 Essential (primary) hypertension: Secondary | ICD-10-CM | POA: Diagnosis present

## 2018-08-21 DIAGNOSIS — R531 Weakness: Secondary | ICD-10-CM | POA: Diagnosis not present

## 2018-08-21 DIAGNOSIS — Z7189 Other specified counseling: Secondary | ICD-10-CM | POA: Diagnosis not present

## 2018-08-21 DIAGNOSIS — I252 Old myocardial infarction: Secondary | ICD-10-CM | POA: Diagnosis not present

## 2018-08-21 DIAGNOSIS — Z515 Encounter for palliative care: Secondary | ICD-10-CM | POA: Diagnosis not present

## 2018-08-21 DIAGNOSIS — Z85118 Personal history of other malignant neoplasm of bronchus and lung: Secondary | ICD-10-CM | POA: Diagnosis not present

## 2018-08-21 DIAGNOSIS — Z8249 Family history of ischemic heart disease and other diseases of the circulatory system: Secondary | ICD-10-CM

## 2018-08-21 DIAGNOSIS — S0990XA Unspecified injury of head, initial encounter: Secondary | ICD-10-CM | POA: Diagnosis not present

## 2018-08-21 DIAGNOSIS — I13 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 failure and stage 1 through stage 4 chronic kidney disease, or unspecified chronic kidney disease: Secondary | ICD-10-CM | POA: Diagnosis not present

## 2018-08-21 DIAGNOSIS — J9811 Atelectasis: Secondary | ICD-10-CM | POA: Diagnosis not present

## 2018-08-21 DIAGNOSIS — Z20828 Contact with and (suspected) exposure to other viral communicable diseases: Secondary | ICD-10-CM | POA: Diagnosis not present

## 2018-08-21 DIAGNOSIS — I4891 Unspecified atrial fibrillation: Secondary | ICD-10-CM | POA: Diagnosis not present

## 2018-08-21 DIAGNOSIS — I69218 Other symptoms and signs involving cognitive functions following other nontraumatic intracranial hemorrhage: Secondary | ICD-10-CM | POA: Diagnosis not present

## 2018-08-21 DIAGNOSIS — Z96653 Presence of artificial knee joint, bilateral: Secondary | ICD-10-CM | POA: Diagnosis present

## 2018-08-21 DIAGNOSIS — Z91040 Latex allergy status: Secondary | ICD-10-CM

## 2018-08-21 HISTORY — DX: Altered mental status, unspecified: R41.82

## 2018-08-21 LAB — CBC WITH DIFFERENTIAL/PLATELET
Abs Immature Granulocytes: 0.07 10*3/uL (ref 0.00–0.07)
Basophils Absolute: 0 10*3/uL (ref 0.0–0.1)
Basophils Relative: 0 %
Eosinophils Absolute: 0.1 10*3/uL (ref 0.0–0.5)
Eosinophils Relative: 1 %
HCT: 35.3 % — ABNORMAL LOW (ref 39.0–52.0)
Hemoglobin: 11.4 g/dL — ABNORMAL LOW (ref 13.0–17.0)
Immature Granulocytes: 1 %
Lymphocytes Relative: 18 %
Lymphs Abs: 1.5 10*3/uL (ref 0.7–4.0)
MCH: 30.3 pg (ref 26.0–34.0)
MCHC: 32.3 g/dL (ref 30.0–36.0)
MCV: 93.9 fL (ref 80.0–100.0)
Monocytes Absolute: 1 10*3/uL (ref 0.1–1.0)
Monocytes Relative: 11 %
Neutro Abs: 6 10*3/uL (ref 1.7–7.7)
Neutrophils Relative %: 69 %
Platelets: 192 10*3/uL (ref 150–400)
RBC: 3.76 MIL/uL — ABNORMAL LOW (ref 4.22–5.81)
RDW: 13.8 % (ref 11.5–15.5)
WBC: 8.6 10*3/uL (ref 4.0–10.5)
nRBC: 0 % (ref 0.0–0.2)

## 2018-08-21 LAB — COMPREHENSIVE METABOLIC PANEL
ALT: 14 U/L (ref 0–44)
AST: 19 U/L (ref 15–41)
Albumin: 2.8 g/dL — ABNORMAL LOW (ref 3.5–5.0)
Alkaline Phosphatase: 120 U/L (ref 38–126)
Anion gap: 12 (ref 5–15)
BUN: 11 mg/dL (ref 8–23)
CO2: 22 mmol/L (ref 22–32)
Calcium: 8.8 mg/dL — ABNORMAL LOW (ref 8.9–10.3)
Chloride: 103 mmol/L (ref 98–111)
Creatinine, Ser: 1.26 mg/dL — ABNORMAL HIGH (ref 0.61–1.24)
GFR calc Af Amer: 59 mL/min — ABNORMAL LOW (ref >60)
GFR calc non Af Amer: 51 mL/min — ABNORMAL LOW (ref >60)
Glucose, Bld: 107 mg/dL — ABNORMAL HIGH (ref 70–99)
Potassium: 4.1 mmol/L (ref 3.5–5.1)
Sodium: 137 mmol/L (ref 135–145)
Total Bilirubin: 0.6 mg/dL (ref 0.3–1.2)
Total Protein: 6.4 g/dL — ABNORMAL LOW (ref 6.5–8.1)

## 2018-08-21 LAB — URINALYSIS, ROUTINE W REFLEX MICROSCOPIC
Bilirubin Urine: NEGATIVE
Glucose, UA: NEGATIVE mg/dL
Ketones, ur: NEGATIVE mg/dL
Nitrite: POSITIVE — AB
Protein, ur: NEGATIVE mg/dL
Specific Gravity, Urine: 1.008 (ref 1.005–1.030)
WBC, UA: >50 WBC/hpf — ABNORMAL HIGH (ref 0–5)
pH: 7 (ref 5.0–8.0)

## 2018-08-21 LAB — TROPONIN I: Troponin I: <0.03 ng/mL (ref <0.03)

## 2018-08-21 LAB — BRAIN NATRIURETIC PEPTIDE: B Natriuretic Peptide: 314.5 pg/mL — ABNORMAL HIGH (ref 0.0–100.0)

## 2018-08-21 LAB — SARS CORONAVIRUS 2 BY RT PCR (HOSPITAL ORDER, PERFORMED IN ~~LOC~~ HOSPITAL LAB): SARS Coronavirus 2: NEGATIVE

## 2018-08-21 MED ORDER — METOPROLOL TARTRATE 12.5 MG HALF TABLET
12.5000 mg | ORAL_TABLET | Freq: Two times a day (BID) | ORAL | Status: DC
  Administered 2018-08-22 – 2018-08-24 (×6): 12.5 mg via ORAL
  Filled 2018-08-21 (×6): qty 1

## 2018-08-21 MED ORDER — LIFITEGRAST 5 % OP SOLN
1.0000 [drp] | Freq: Every day | OPHTHALMIC | Status: DC
  Administered 2018-08-24: 2 [drp] via OPHTHALMIC

## 2018-08-21 MED ORDER — ROSUVASTATIN CALCIUM 5 MG PO TABS
5.0000 mg | ORAL_TABLET | Freq: Every day | ORAL | Status: DC
  Administered 2018-08-22 – 2018-08-23 (×3): 5 mg via ORAL
  Filled 2018-08-21 (×3): qty 1

## 2018-08-21 MED ORDER — TAMSULOSIN HCL 0.4 MG PO CAPS
0.4000 mg | ORAL_CAPSULE | Freq: Every day | ORAL | Status: DC
  Administered 2018-08-22 – 2018-08-24 (×4): 0.4 mg via ORAL
  Filled 2018-08-21 (×4): qty 1

## 2018-08-21 MED ORDER — SODIUM CHLORIDE 0.9 % IV SOLN
1.0000 g | Freq: Once | INTRAVENOUS | Status: AC
  Administered 2018-08-21: 1 g via INTRAVENOUS
  Filled 2018-08-21: qty 10

## 2018-08-21 MED ORDER — ACETAMINOPHEN 650 MG RE SUPP: 650.0000 mg | Freq: Four times a day (QID) | RECTAL | Status: DC | PRN

## 2018-08-21 MED ORDER — ACETAMINOPHEN 325 MG PO TABS: 650.0000 mg | ORAL_TABLET | Freq: Four times a day (QID) | ORAL | Status: DC | PRN

## 2018-08-21 MED ORDER — SODIUM CHLORIDE 0.9 % IV SOLN
1.0000 g | INTRAVENOUS | Status: DC
  Administered 2018-08-22 – 2018-08-23 (×2): 1 g via INTRAVENOUS
  Filled 2018-08-21 (×2): qty 10

## 2018-08-21 MED ORDER — SENNOSIDES-DOCUSATE SODIUM 8.6-50 MG PO TABS: 1.0000 | ORAL_TABLET | Freq: Every evening | ORAL | Status: DC | PRN

## 2018-08-21 NOTE — Telephone Encounter (Signed)
Received a phone call from Jeris Penta whom is a paramedic with [employer]. She called stating that she ran a call to the patient's home for SOB. She stated that patient was not in any distress when she arrived however she did notice that his pulse oximetry kept dropping in the 80's and then would go back on its on, on room air to 90's. She wanted to call and inquire about getting patient supplemental oxygen to use at home. I informed her of the process and that I would reach out to the family to get an appointment for [place] to do the oxygen testing in office that is required for oxygen therapy per medicare. Is it ok for me to get patient scheduled to come into our office for this? Please advise

## 2018-08-21 NOTE — ED Notes (Signed)
Pt intermittently calls out for help and requires redirection to place and situation.  Calms for a while before repeating this process.

## 2018-08-21 NOTE — ED Notes (Signed)
MD in room and does answer some questions, admit to a recent fall.

## 2018-08-21 NOTE — ED Notes (Signed)
Nurse Navigator communication: Daughter Wille Glaser was given an update on patient status and care plan. She would like to speak with her father, the phone was provided to the patient. She appreciated the call.

## 2018-08-21 NOTE — Telephone Encounter (Signed)
[place] for ov. He was literally just discharged from hospital

## 2018-08-21 NOTE — Telephone Encounter (Signed)
Karn Cassis, PT, [facility] left a message asking for verbal orders for HHPT 2week4. Medical record reviewed. Social work note reviewed.  Verbal orders given per office protocol.

## 2018-08-21 NOTE — ED Notes (Signed)
ED TO INPATIENT HANDOFF REPORT  ED Nurse Name and Phone #: [phone] Lucita Ferrara Name/Age/Gender [name] 85 y.o. male Room/Bed: 023C/023C  Code Status   Code Status: Prior  Home/SNF/Other Home Patient oriented to: self Is this baseline? Yes   Triage Complete: Triage complete  Chief Complaint SOB  Triage Note Pt seen by EMS this morning with c/o SOB and was a no transport.  Returned this afternoon with increasing reports of cough and dyspnea.   Has an appt with MD this week, instructed to come to ED.  Pt very HOH, alert to person only.  Pt also has hx of wounds being treated by [facility] which are not improving per EMS report.   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    Allergies Allergies  Allergen Reactions  . Feldene [Piroxicam] Other (See Comments)    REACTION: Blisters  . Statins Other (See Comments)    Myalgias  . Doxycycline Other (See Comments)    REACTION:  unknown  . Latex Rash  . Tequin Anxiety  . Valium Other (See Comments)    REACTION:  unknown    Level of Care/Admitting Diagnosis ED Disposition    ED Disposition Condition [facility] Area: [place] [[id number]]  Level of Care: Med-Surg [16]  I expect the patient will be discharged within 24 hours: Yes  LOW acuity---Tx typically complete <24 hrs---ACUTE conditions typically can be evaluated <24 hours---LABS likely to return to acceptable levels <24 hours---IS near functional baseline---EXPECTED to return to current living arrangement---NOT newly hypoxic: Meets criteria for 5C-Observation unit  Covid Evaluation: N/A  Diagnosis: Altered mental status [780.97.ICD-9-CM]  Admitting Physician: Lenore Cordia [[id number]]  Attending Physician: Lenore Cordia [[id number]]  PT Class (Do Not Modify): Observation [104]  PT Acc Code (Do Not Modify): Observation [10022]       B Medical/Surgery History Past Medical History:  Diagnosis Date  . Adenocarcinoma of right lung ([place]) 01/06/2016   . Anginal chest pain at rest [facility])    Chronic non, controlled on Lorazepam  . Anxiety   . Arthritis    "all over"   . BPH (benign prostatic hyperplasia)   . CAD (coronary artery disease)   . Chronic lower back pain   . Complication of anesthesia    "problems making water afterwards"  . Depression   . DJD (degenerative joint disease)   . Esophageal ulcer without bleeding    bid ppi indefinitely  . Family history of adverse reaction to anesthesia    "children w/PONV"  . GERD (gastroesophageal reflux disease)   . Headache    "couple/week maybe" (09/04/2015)  . Hemochromatosis    Possible, elevated iron stores, hook-like osteophytes on hand films, normal LFTs  . Hepatitis    "yellow jaundice as a baby"  . History of ASCVD    MULTIVESSEL  . Hyperlipidemia   . Hypertension   . Myocardial infarction ([place]) 1999  . Osteoarthritis   . Pneumonia    "several times; got a little now" (09/04/2015)  . Rheumatoid arthritis ([place])   . Subdural hematoma ([facility])    small after fall 09/2016-plavix held, neurosurgery consulted.  Asymptomatic.    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 ARM SKIN LESION BIOPSY / EXCISION Left 09/02/2015  . CATARACT EXTRACTION W/ INTRAOCULAR LENS  IMPLANT, BILATERAL Bilateral   . CHOLECYSTECTOMY OPEN    .  CORNEAL TRANSPLANT Bilateral    "one at [facility]; one at [facility]"  . CORONARY ANGIOPLASTY WITH STENT PLACEMENT    . CORONARY ARTERY BYPASS GRAFT  1999  . DESCENDING AORTIC ANEURYSM REPAIR W/ STENT     DE stent ostium into the right radial free graft at OM-, 02-2007  . ESOPHAGOGASTRODUODENOSCOPY N/A 11/09/2014   Procedure: ESOPHAGOGASTRODUODENOSCOPY (EGD);  Surgeon: [name], MD;  [facility];  Service: Endoscopy;  Laterality: N/A;  . INGUINAL HERNIA REPAIR    . JOINT REPLACEMENT    . LEFT HEART CATH AND CORS/GRAFTS ANGIOGRAPHY N/A 06/27/2017   Procedure: LEFT  HEART CATH AND CORS/GRAFTS ANGIOGRAPHY;  Surgeon: [name], MD;  Location: [facility] CV LAB;  Service: Cardiovascular;  Laterality: N/A;  . NASAL SINUS SURGERY    . SHOULDER OPEN ROTATOR CUFF REPAIR Right   . TOTAL KNEE ARTHROPLASTY Bilateral      A IV Location/Drains/Wounds Patient Lines/Drains/Airways Status   Active Line/Drains/Airways    Name:   Placement date:   Placement time:   Site:   Days:   Peripheral IV 08/21/18 Left Forearm   08/21/18    -    Forearm   less than 1   External Urinary Catheter   08/21/18    1658    -   less than 1   Wound / Incision (Open or Dehisced) 08/12/18 Other (Comment) Heel Posterior;Right Deep Tissue Injurry   08/12/18    0930    Heel   9   Wound / Incision (Open or Dehisced) 08/12/18 Other (Comment) Ear Right Device Associated Pressure Ulcer    08/12/18    1640    Ear   9          Intake/Output Last 24 hours No intake or output data in the 24 hours ending 08/21/18 2131  Labs/Imaging Results for orders placed or performed during the hospital encounter of 08/21/18 (from the past 48 hour(s))  CBC with Differential/Platelet     Status: Abnormal   Collection Time: 08/21/18  3:30 PM  Result Value Ref Range   WBC 8.6 4.0 - 10.5 K/uL   RBC 3.76 (L) 4.22 - 5.81 MIL/uL   Hemoglobin 11.4 (L) 13.0 - 17.0 g/dL   HCT 35.3 (L) 39.0 - 52.0 %   MCV 93.9 80.0 - 100.0 fL   MCH 30.3 26.0 - 34.0 pg   MCHC 32.3 30.0 - 36.0 g/dL   RDW 13.8 11.5 - 15.5 %   Platelets 192 150 - 400 K/uL   nRBC 0.0 0.0 - 0.2 %   Neutrophils Relative % 69 %   Neutro Abs 6.0 1.7 - 7.7 K/uL   Lymphocytes Relative 18 %   Lymphs Abs 1.5 0.7 - 4.0 K/uL   Monocytes Relative 11 %   Monocytes Absolute 1.0 0.1 - 1.0 K/uL   Eosinophils Relative 1 %   Eosinophils Absolute 0.1 0.0 - 0.5 K/uL   Basophils Relative 0 %   Basophils Absolute 0.0 0.0 - 0.1 K/uL   Immature Granulocytes 1 %   Abs Immature Granulocytes 0.07 0.00 - 0.07 K/uL    Comment: Performed at [facility], [address] [place], [place] [address]  Comprehensive metabolic panel     Status: Abnormal   Collection Time: 08/21/18  3:30 PM  Result Value Ref Range   Sodium 137 135 - 145 mmol/L   Potassium 4.1 3.5 - 5.1 mmol/L   Chloride 103 98 - 111 mmol/L   CO2  22 22 - 32 mmol/L   Glucose, Bld 107 (H) 70 - 99 mg/dL   BUN 11 8 - 23 mg/dL   Creatinine, Ser 1.26 (H) 0.61 - 1.24 mg/dL   Calcium 8.8 (L) 8.9 - 10.3 mg/dL   Total Protein 6.4 (L) 6.5 - 8.1 g/dL   Albumin 2.8 (L) 3.5 - 5.0 g/dL   AST 19 15 - 41 U/L   ALT 14 0 - 44 U/L   Alkaline Phosphatase 120 38 - 126 U/L   Total Bilirubin 0.6 0.3 - 1.2 mg/dL   GFR calc non Af Amer 51 (L) >60 mL/min   GFR calc Af Amer 59 (L) >60 mL/min   Anion gap 12 5 - 15    Comment: Performed at [facility] [address] [place], [place] [address]  Brain natriuretic peptide     Status: Abnormal   Collection Time: 08/21/18  3:30 PM  Result Value Ref Range   B Natriuretic Peptide 314.5 (H) 0.0 - 100.0 pg/mL    Comment: Performed at [facility] [address], [place], [place] [address]  Troponin I - ONCE - STAT     Status: None   Collection Time: 08/21/18  3:30 PM  Result Value Ref Range   Troponin I <0.03 <0.03 ng/mL    Comment: Performed at [facility] [address], [place], [place] [address]  SARS Coronavirus 2 (CEPHEID- Performed in [place] hospital lab), Hosp Order     Status: None   Collection Time: 08/21/18  4:57 PM  Result Value Ref Range   SARS Coronavirus 2 NEGATIVE NEGATIVE    Comment: (NOTE) If result is NEGATIVE SARS-CoV-2 target nucleic acids are NOT DETECTED. The SARS-CoV-2 RNA is generally detectable in upper and lower  respiratory specimens during the acute phase of infection. The lowest  concentration of SARS-CoV-2 viral copies this assay can detect is 250  copies / mL. A negative result does not preclude SARS-CoV-2 infection  and should not be used as the sole basis for treatment or other  patient  management decisions.  A negative result may occur with  improper specimen collection / handling, submission of specimen other  than nasopharyngeal swab, presence of viral mutation(s) within the  areas targeted by this assay, and inadequate number of viral copies  (<250 copies / mL). A negative result must be combined with clinical  observations, patient history, and epidemiological information. If result is POSITIVE SARS-CoV-2 target nucleic acids are DETECTED. The SARS-CoV-2 RNA is generally detectable in upper and lower  respiratory specimens dur ing the acute phase of infection.  Positive  results are indicative of active infection with SARS-CoV-2.  Clinical  correlation with patient history and other diagnostic information is  necessary to determine patient infection status.  Positive results do  not rule out bacterial infection or co-infection with other viruses. If result is PRESUMPTIVE POSTIVE SARS-CoV-2 nucleic acids MAY BE PRESENT.   A presumptive positive result was obtained on the submitted specimen  and confirmed on repeat testing.  While 2019 novel coronavirus  (SARS-CoV-2) nucleic acids may be present in the submitted sample  additional confirmatory testing may be necessary for epidemiological  and / or clinical management purposes  to differentiate between  SARS-CoV-2 and other Sarbecovirus currently known to infect humans.  If clinically indicated additional testing with an alternate test  methodology [phone]) is advised. The SARS-CoV-2 RNA is generally  detectable in upper and lower respiratory sp ecimens during  the acute  phase of infection. The expected result is Negative. Fact Sheet for Patients:  StrictlyIdeas.no Fact Sheet for Healthcare Providers: BankingDealers.co.za This test is not yet approved or cleared by the [place] FDA and has been authorized for detection and/or diagnosis of SARS-CoV-2 by FDA under  an Emergency Use Authorization (EUA).  This EUA will remain in effect (meaning this test can be used) for the duration of the COVID-19 declaration under Section 564(b)(1) of the Act, 21 U.S.C. section 360bbb-3(b)(1), unless the authorization is terminated or revoked sooner. Performed at [facility], [place] [address], [place], [place] [address]   Urinalysis, Routine w reflex microscopic     Status: Abnormal   Collection Time: 08/21/18  6:00 PM  Result Value Ref Range   Color, Urine YELLOW YELLOW   APPearance CLOUDY (A) CLEAR   Specific Gravity, Urine 1.008 1.005 - 1.030   pH 7.0 5.0 - 8.0   Glucose, UA NEGATIVE NEGATIVE mg/dL   Hgb urine dipstick SMALL (A) NEGATIVE   Bilirubin Urine NEGATIVE NEGATIVE   Ketones, ur NEGATIVE NEGATIVE mg/dL   Protein, ur NEGATIVE NEGATIVE mg/dL   Nitrite POSITIVE (A) NEGATIVE   Leukocytes,Ua LARGE (A) NEGATIVE   RBC / HPF 0-5 0 - 5 RBC/hpf   WBC, UA >50 (H) 0 - 5 WBC/hpf   Bacteria, UA MANY (A) NONE SEEN   Squamous Epithelial / LPF 0-5 0 - 5   WBC Clumps PRESENT    Mucus PRESENT     Comment: Performed at [facility], [place] [address] [place], [place] [address]   Ct Head Wo Contrast  Result Date: 08/21/2018 CLINICAL DATA:  Recent fall. EXAM: CT HEAD WITHOUT CONTRAST TECHNIQUE: Contiguous axial images were obtained from the base of the skull through the vertex without intravenous contrast. COMPARISON:  07/30/2018 FINDINGS: Brain: Interval evolution of left thalamic hemorrhagic infarct. Hemorrhagic component has resolved in the interval. No hydrocephalus or midline shift. No abnormal extra-axial fluid collection. No CT evidence for acute ischemia. Diffuse loss of parenchymal volume is consistent with atrophy. Patchy low attenuation in the deep hemispheric and periventricular white matter is nonspecific, but likely reflects chronic microvascular ischemic demyelination. Lacunar infarct noted right cerebellum. Vascular: No hyperdense vessel or  unexpected calcification. Skull: No evidence for fracture. No worrisome lytic or sclerotic lesion. Sinuses/Orbits: Chronic left maxillary sinusitis. Surgical changes right mastoid air cells. Visualized portions of the globes and intraorbital fat are unremarkable. Other: None. IMPRESSION: 1. Interval evolution of the left thalamic infarct. No new or acute intracranial abnormality on today's study. 2. Atrophy with chronic small vessel white matter ischemic disease. Electronically Signed   By: Misty Stanley M.D.   On: 08/21/2018 18:21   Dg Chest Port 1 View  Result Date: 08/21/2018 CLINICAL DATA:  Shortness of breath.  Pneumonia. EXAM: PORTABLE CHEST 1 VIEW COMPARISON:  08/02/2018 FINDINGS: The cardiac silhouette is enlarged. The patient is status post prior median sternotomy. There are small bilateral pleural effusions. There is a right lower lobe airspace opacity. The previously noted left lower lobe airspace opacity has improved. There is no pneumothorax. IMPRESSION: 1. Cardiomegaly. The patient is status post prior median sternotomy. 2. Trace to small bilateral pleural effusions. 3. Improved aeration at the left lung base. Persistent opacity at the right lung base favored to represent atelectasis with infiltrate not entirely excluded. Electronically Signed   By: Constance Holster M.D.   On: 08/21/2018 16:16    Pending Labs Unresulted Labs (From admission, onward)  Start     Ordered   08/21/18 2122  Urine rapid drug screen (hosp performed)  Add-on,   R     08/21/18 2121   08/21/18 1912  Urine culture  ONCE - STAT,   STAT     08/21/18 1912          Vitals/Pain Today's Vitals   08/21/18 1933 08/21/18 2000 08/21/18 2045 08/21/18 2115  BP:  119/73 108/76 112/71  Pulse:  80 79 77  Resp:  (!) 29 (!) 25 (!) 22  SpO2:  100% 100% 98%  PainSc: 0-No pain       Isolation Precautions Droplet and Contact precautions  Medications Medications  cefTRIAXone (ROCEPHIN) 1 g in sodium chloride 0.9 %  100 mL IVPB (0 g Intravenous Stopped 08/21/18 2008)    Mobility walks with person assist High fall risk   Focused Assessments Renal Assessment Handoff:      R Recommendations: See Admitting Provider Note  Report given to:   Additional Notes: N/A

## 2018-08-21 NOTE — Telephone Encounter (Signed)
Patient hospital visit moved up to Thursday.

## 2018-08-21 NOTE — ED Notes (Signed)
Nurse Navigator communication: Pt has spoken with daughter. Please call her with updates for admission or d/c. Thank you.

## 2018-08-21 NOTE — ED Provider Notes (Signed)
[place] EMERGENCY DEPARTMENT Provider Note   CSN: [id number] Arrival date & time: 08/21/18  1512    History   Chief Complaint No chief complaint on file.   HPI Evan [name] is a 85 y.o. male.     HPI Patient recently admitted for hemorrhagic stroke with residual right-sided weakness.  EMS called out by family for increased shortness of breath.  Patient also has had cough.  Patient is a poor historian but currently denying any pain at this time. Past Medical History:  Diagnosis Date  . Adenocarcinoma of right lung ([place]) 01/06/2016  . Anginal chest pain at rest [facility])    Chronic non, controlled on Lorazepam  . Anxiety   . Arthritis    "all over"   . BPH (benign prostatic hyperplasia)   . CAD (coronary artery disease)   . Chronic lower back pain   . Complication of anesthesia    "problems making water afterwards"  . Depression   . DJD (degenerative joint disease)   . Esophageal ulcer without bleeding    bid ppi indefinitely  . Family history of adverse reaction to anesthesia    "children w/PONV"  . GERD (gastroesophageal reflux disease)   . Headache    "couple/week maybe" (09/04/2015)  . Hemochromatosis    Possible, elevated iron stores, hook-like osteophytes on hand films, normal LFTs  . Hepatitis    "yellow jaundice as a baby"  . History of ASCVD    MULTIVESSEL  . Hyperlipidemia   . Hypertension   . Myocardial infarction ([place]) 1999  . Osteoarthritis   . Pneumonia    "several times; got a little now" (09/04/2015)  . Rheumatoid arthritis ([place])   . Subdural hematoma ([facility])    small after fall 09/2016-plavix held, neurosurgery consulted.  Asymptomatic.    Marland Kitchen 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[place]) 02/2018   on Lovenox lifelong since he failed PO Eliquis    Patient Active Problem List   Diagnosis Date Noted  . Abnormality of gait 08/20/2018  . Prolonged Q-T interval on ECG   . Dysphagia, post-stroke   . Supplemental oxygen dependent   . Anxiety state   .  Fatigue   . SOB (shortness of breath)   . Hyperglycemia   . History of lung cancer   . Depression   . CKD (chronic kidney disease), stage III ([place])   . Acute blood loss anemia   . Fall 07/16/2018  . 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 Acute spontaneous 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 ([employer]) L thalamic 07/14/2018  . Cellulitis of arm, right 06/01/2018  . Rash and nonspecific skin eruption 06/01/2018  . Chest pain 02/02/2018  . Thrombocytopenia ([place]) 02/02/2018  . Hypokalemia 02/02/2018  . Pulmonary embolism ([place]) 02/02/2018  . Elevated troponin 02/02/2018  . Pulmonary embolus ([place]) 02/02/2018  . Non-ST elevation MI (NSTEMI) ([place]) 06/27/2017  . Diastolic CHF ([place]) 46/50/3546  . Chest pain due to CAD ([place]) 06/26/2017  . Dyspnea 06/21/2017  . Acute bronchitis 05/15/2017  . Oral candidiasis 05/15/2017  . Acute pericarditis   . Chest pain at rest 12/20/2016  . Angina pectoris ([place]) 12/20/2016  . Radiation pneumonitis ([place]) 11/09/2016  . Pleurisy 10/11/2016  . Chest wall pain 10/11/2016  . Atrial fibrillation ([place]) 05/21/2016  . Adenocarcinoma of right lung ([place]) 01/06/2016  . GAD (generalized anxiety disorder) 11/20/2015  . Essential hypertension   . Cellulitis 09/04/2015  . Cellulitis of left axilla   . Esophageal ulcer  without bleeding   . Bilateral lower extremity edema 04/28/2014  . BPH (benign prostatic hyperplasia) 10/08/2012  . Anxiety   . EKG abnormalities 09/05/2012  . Tremor 09/05/2012  . Chronic fatigue 09/05/2012  . Arthritis   . Asthma, chronic   . Reflux esophagitis   . S/P CABG (coronary artery bypass graft)   . GERD (gastroesophageal reflux disease)   . Hyperlipidemia   . Hypertension   . UNSPECIFIED PERIPHERAL VASCULAR DISEASE 06/16/2009    Past Surgical History:  Procedure Laterality Date  . ARM SKIN LESION BIOPSY / EXCISION Left 09/02/2015  . CATARACT EXTRACTION W/  INTRAOCULAR LENS  IMPLANT, BILATERAL Bilateral   . CHOLECYSTECTOMY OPEN    . CORNEAL TRANSPLANT Bilateral    "[facility]; one at [facility]"  . CORONARY ANGIOPLASTY WITH STENT PLACEMENT    . CORONARY ARTERY BYPASS GRAFT  1999  . DESCENDING AORTIC ANEURYSM REPAIR W/ STENT     DE stent ostium into the right radial free graft at OM-, 02-2007  . ESOPHAGOGASTRODUODENOSCOPY N/A 11/09/2014   Procedure: ESOPHAGOGASTRODUODENOSCOPY (EGD);  Surgeon: [name], MD;  Location: [facility] ENDOSCOPY;  Service: Endoscopy;  Laterality: N/A;  . INGUINAL HERNIA REPAIR    . JOINT REPLACEMENT    . LEFT HEART CATH AND CORS/GRAFTS ANGIOGRAPHY N/A 06/27/2017   Procedure: LEFT HEART CATH AND CORS/GRAFTS ANGIOGRAPHY;  Surgeon: [name], MD;  Location: [name];  Service: Cardiovascular;  Laterality: N/A;  . NASAL SINUS SURGERY    . SHOULDER OPEN ROTATOR CUFF REPAIR Right   . TOTAL KNEE ARTHROPLASTY Bilateral         Home Medications    Prior to Admission medications   Medication Sig Start Date End Date Taking? Authorizing Provider  acetaminophen (TYLENOL) 325 MG tablet Take 1-2 tablets (325-650 mg total) by mouth every 4 (four) hours as needed for mild pain. 08/09/18  Yes Love, Ivan Anchors, PA-C  aspirin EC 81 MG EC tablet Take 1 tablet (81 mg total) by mouth daily. 08/14/18  Yes Angiulli, Lavon Paganini, PA-C  camphor-menthol [facility]) lotion Apply topically as needed for itching. Patient taking differently: Apply 1 application topically daily as needed for itching.  06/04/18  Yes Thurnell Lose, MD  furosemide (LASIX) 20 MG tablet Take 1 tablet (20 mg total) by mouth daily. 08/14/18  Yes Angiulli, Lavon Paganini, PA-C  Hydrocortisone (GERHARDT'S BUTT CREAM) CREA Apply 5 application topically 2 (two) times daily. 08/14/18  Yes Angiulli, Lavon Paganini, PA-C  Lifitegrast [facility]) 5 % SOLN Place 1-2 drops into both eyes daily.    Yes [provider]  LORazepam (ATIVAN) 0.5 MG tablet Take 1.5 tablets (0.75 mg total) by mouth 3  (three) times daily. Patient taking differently: Take 0.5 mg by mouth 3 (three) times daily.  08/14/18  Yes Angiulli, Lavon Paganini, PA-C  magnesium oxide (MAG-OX) 400 (241.3 Mg) MG tablet Take 1 tablet (400 mg total) by mouth 2 (two) times daily. 08/14/18  Yes Angiulli, Lavon Paganini, PA-C  metoprolol tartrate (LOPRESSOR) 25 MG tablet Take 0.5 tablets (12.5 mg total) by mouth 2 (two) times daily. 08/14/18  Yes Angiulli, Lavon Paganini, PA-C  nitroGLYCERIN (NITROSTAT) 0.4 MG SL tablet Place 1 tablet (0.4 mg total) under the tongue every 5 (five) minutes as needed for chest pain. 12/06/17  Yes Daune Perch, NP  pantoprazole (PROTONIX) 40 MG tablet Take 1 tablet (40 mg total) by mouth every other day. 08/14/18  Yes Angiulli, Lavon Paganini, PA-C  polyethylene glycol (MIRALAX / GLYCOLAX) 17  g packet Take 17 g by mouth daily as needed for mild constipation. 08/14/18  Yes Angiulli, Lavon Paganini, PA-C  rosuvastatin (CRESTOR) 5 MG tablet Take 1 tablet (5 mg total) by mouth daily at 6 PM. 08/14/18  Yes Angiulli, Lavon Paganini, PA-C  tamsulosin (FLOMAX) 0.4 MG CAPS capsule Take 1 capsule (0.4 mg total) by mouth daily. 08/14/18  Yes Angiulli, Lavon Paganini, PA-C  cycloSPORINE (RESTASIS) 0.05 % ophthalmic emulsion Place 1 drop into both eyes 2 (two) times daily. Patient not taking: Reported on 08/21/2018 08/14/18   Angiulli, Lavon Paganini, PA-C    Family History Family History  Problem Relation Age of Onset  . Arthritis-Osteo Sister   . Heart attack Brother   . Heart attack Other   . Cancer Neg Hx     Social History Social History   Tobacco Use  . Smoking status: Never Smoker  . Smokeless tobacco: Former Systems developer    Types: Chew  . Tobacco comment: "quit chewing in the 1960s"  Substance Use Topics  . Alcohol use: No  . Drug use: No     Allergies   Feldene [piroxicam]; Statins; Doxycycline; Latex; Tequin; and Valium   Review of Systems Review of Systems  Unable to perform ROS: Dementia     Physical Exam Updated Vital Signs BP 119/73    Pulse 80   Resp (!) 29   SpO2 100%   Physical Exam Vitals signs and nursing note reviewed.  Constitutional:      General: He is not in acute distress.    Appearance: Normal appearance. He is well-developed. He is not ill-appearing.  HENT:     Head: Normocephalic and atraumatic.     Comments: Patient is hard of hearing.  No obvious scalp trauma.  No facial asymmetry.  Mucous membranes are dry.    Nose: Nose normal.  Eyes:     Extraocular Movements: Extraocular movements intact.     Pupils: Pupils are equal, round, and reactive to light.  Neck:     Musculoskeletal: Normal range of motion and neck supple. No neck rigidity or muscular tenderness.     Comments: No posterior midline cervical tenderness to palpation.  No meningismus. Cardiovascular:     Rate and Rhythm: Normal rate and regular rhythm.  Pulmonary:     Effort: Pulmonary effort is normal.     Breath sounds: Normal breath sounds.  Abdominal:     General: Bowel sounds are normal.     Palpations: Abdomen is soft.     Tenderness: There is no abdominal tenderness. There is no guarding or rebound.  Musculoskeletal: Normal range of motion.        General: No swelling, tenderness, deformity or signs of injury.     Right lower leg: No edema.     Left lower leg: No edema.  Lymphadenopathy:     Cervical: No cervical adenopathy.  Skin:    General: Skin is warm and dry.     Capillary Refill: Capillary refill takes less than 2 seconds.     Findings: No erythema or rash.  Neurological:     Mental Status: He is alert.     Comments: Oriented to self.  Mild right grip strength weakness compared to left.  Moving both lower extremities without focal deficit.  Sensation to light touch grossly intact.  Psychiatric:        Behavior: Behavior normal.      ED Treatments / Results  Labs (all labs ordered are listed, but only abnormal  results are displayed) Labs Reviewed  CBC WITH DIFFERENTIAL/PLATELET - Abnormal; Notable for the  following components:      Result Value   RBC 3.76 (*)    Hemoglobin 11.4 (*)    HCT 35.3 (*)    All other components within normal limits  COMPREHENSIVE METABOLIC PANEL - Abnormal; Notable for the following components:   Glucose, Bld 107 (*)    Creatinine, Ser 1.26 (*)    Calcium 8.8 (*)    Total Protein 6.4 (*)    Albumin 2.8 (*)    GFR calc non Af Amer 51 (*)    GFR calc Af Amer 59 (*)    All other components within normal limits  BRAIN NATRIURETIC PEPTIDE - Abnormal; Notable for the following components:   B Natriuretic Peptide 314.5 (*)    All other components within normal limits  URINALYSIS, ROUTINE W REFLEX MICROSCOPIC - Abnormal; Notable for the following components:   APPearance CLOUDY (*)    Hgb urine dipstick SMALL (*)    Nitrite POSITIVE (*)    Leukocytes,Ua LARGE (*)    WBC, UA >50 (*)    Bacteria, UA MANY (*)    All other components within normal limits  SARS CORONAVIRUS 2 (HOSPITAL ORDER, [place] [facility])  URINE CULTURE  TROPONIN I    EKG EKG Interpretation  Date/Time:  Tuesday Aug 21 2018 15:22:20 EDT Ventricular Rate:  84 PR Interval:    QRS Duration: 120 QT Interval:  385 QTC Calculation: 428 R Axis:   -7 Text Interpretation:  Sinus rhythm Supraventricular bigeminy Nonspecific intraventricular conduction delay Anteroseptal infarct, age indeterminate Lateral leads are also involved Confirmed by [name] [phone]) on 08/21/2018 3:56:08 PM   Radiology Ct Head Wo Contrast  Result Date: 08/21/2018 CLINICAL DATA:  Recent fall. EXAM: CT HEAD WITHOUT CONTRAST TECHNIQUE: Contiguous axial images were obtained from the base of the skull through the vertex without intravenous contrast. COMPARISON:  07/30/2018 FINDINGS: Brain: Interval evolution of left thalamic hemorrhagic infarct. Hemorrhagic component has resolved in the interval. No hydrocephalus or midline shift. No abnormal extra-axial fluid collection. No CT evidence for acute  ischemia. Diffuse loss of parenchymal volume is consistent with atrophy. Patchy low attenuation in the deep hemispheric and periventricular white matter is nonspecific, but likely reflects chronic microvascular ischemic demyelination. Lacunar infarct noted right cerebellum. Vascular: No hyperdense vessel or unexpected calcification. Skull: No evidence for fracture. No worrisome lytic or sclerotic lesion. Sinuses/Orbits: Chronic left maxillary sinusitis. Surgical changes right mastoid air cells. Visualized portions of the globes and intraorbital fat are unremarkable. Other: None. IMPRESSION: 1. Interval evolution of the left thalamic infarct. No new or acute intracranial abnormality on today's study. 2. Atrophy with chronic small vessel white matter ischemic disease. Electronically Signed   By: Misty Stanley M.D.   On: 08/21/2018 18:21   Dg Chest Port 1 View  Result Date: 08/21/2018 CLINICAL DATA:  Shortness of breath.  Pneumonia. EXAM: PORTABLE CHEST 1 VIEW COMPARISON:  08/02/2018 FINDINGS: The cardiac silhouette is enlarged. The patient is status post prior median sternotomy. There are small bilateral pleural effusions. There is a right lower lobe airspace opacity. The previously noted left lower lobe airspace opacity has improved. There is no pneumothorax. IMPRESSION: 1. Cardiomegaly. The patient is status post prior median sternotomy. 2. Trace to small bilateral pleural effusions. 3. Improved aeration at the left lung base. Persistent opacity at the right lung base favored to represent atelectasis with infiltrate not entirely excluded. Electronically Signed  By: Constance Holster M.D.   On: 08/21/2018 16:16    Procedures Procedures (including critical care time)  Medications Ordered in ED Medications  cefTRIAXone (ROCEPHIN) 1 g in sodium chloride 0.9 % 100 mL IVPB (0 g Intravenous Stopped 08/21/18 2008)     Initial Impression / Assessment and Plan / ED Course  I have reviewed the triage vital  signs and the nursing notes.  Pertinent labs & imaging results that were available during my care of the patient were reviewed by me and considered in my medical decision making (see chart for details).        Patient remains hemodynamically stable.  Discussed with patient's daughter.  States patient was complaining of shortness of breath earlier today and that has been increasingly lethargic.  Patient has evidence of UTI on UA.  Urine sent for culture.  Given dose of IV Rocephin.  Discussed with hospitalist will admit patient for observation.  Final Clinical Impressions(s) / ED Diagnoses   Final diagnoses:  Generalized weakness  Acute lower UTI    ED Discharge Orders    None       [name], MD 08/21/18 2042

## 2018-08-21 NOTE — ED Triage Notes (Signed)
Pt seen by EMS this morning with c/o SOB and was a no transport.  Returned this afternoon with increasing reports of cough and dyspnea.   Has an appt with MD this week, instructed to come to ED.  Pt very HOH, alert to person only.

## 2018-08-21 NOTE — H&P (Signed)
History and Physical    Evan [name] IRS:[id number] DOB: 02/10/1928 DOA: 08/21/2018  PCP: [name], MD  Patient coming from: Home  I have personally briefly reviewed patient's old medical records in [place]  Chief Complaint: AMS/lethargy  HPI: Evan [name] is a 85 y.o. 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  History is limited from patient, therefore entirety of history is obtained from EDP and chart review.  Patient was recently admitted from 411-07/16/2018 for a small left thalamic intracranial hemorrhage.  He was previously on Lovenox anticoagulation for history of PE/VTE which was discontinued due to [place]. He was discharged to inpatient rehab where he was admitted from 07/16/2018-08/15/2018.  He was noted to have respiratory symptoms and had a CTA chest on 418 which was negative for PE.  There are imaging changes in the right hilum and right lower lobe for which pulmonology were consulted and felt were likely due to radiation pneumonitis and possible aggressive/recurrent lung disease.  Repeat CT head 07/30/2018 showed some improvement of ICH.  Neurology had recommended starting low-dose aspirin.    Patient was noted to have significant anxiety and was started on scheduled Xanax.  Palliative care discussions were recommended, however family declined per discharge summary.  Discharge to home with home health PT, OT, ST, and RN.  He was discharged on dysphagia 2 diet.  EMS were called to his home this morning for reported shortness of breath.  There was no immediate concern and he was  oxygenating well and at that time was not brought to the ED.  Reportedly had increased cough and dyspnea and therefore EMS were called again and he was brought to the ED.  ED Course:  Initial vitals showed BP 116/67, pulse 86, RR 16, temp 97.5 Fahrenheit, SPO2 95% on room air.  Labs are notable for WBC 8.6, hemoglobin 11.4, platelets 192, potassium 4.1, BUN 11, creatinine 1.26, GFR 51, BNP 314.5 (compared to 477.9 - 2 weeks ago), troponin I <0.03.  Urinalysis showed positive nitrites, large leukocytes, 0-5 RBC/hpf, and many bacteria on microscopy.  Urine culture was obtained and patient was started on IV ceftriaxone.  SARS-CoV-2 test was negative.  Portable chest x-ray showed prior sternotomy changes, trace bilateral pleural effusions, improved aeration at the left lung base and persistent opacity at the right lung base.  CT head without contrast showed interval evolution of the left thalamic infarct without new or acute intracranial abnormality.  The hospital service was consulted to admit for further evaluation and management of AMS/generalized weakness/UTI.  Review of Systems: Unable to obtain due to patient's condition.   Past Medical History:  Diagnosis Date  . Adenocarcinoma of right lung ([place]) 01/06/2016  . Anginal chest pain at rest [facility])    Chronic non, controlled on Lorazepam  . Anxiety   . Arthritis    "all over"   . BPH (benign prostatic hyperplasia)   . CAD (coronary artery disease)   . Chronic lower back pain   . Complication of anesthesia    "problems making water afterwards"  . Depression   . DJD (degenerative joint disease)   . Esophageal ulcer without bleeding  bid ppi indefinitely  . Family history of adverse reaction to anesthesia    "children w/PONV"  . GERD (gastroesophageal reflux disease)   . Headache    "couple/week maybe" (09/04/2015)  . Hemochromatosis    Possible, elevated iron stores, hook-like osteophytes on hand films, normal LFTs  . Hepatitis     "yellow jaundice as a baby"  . History of ASCVD    MULTIVESSEL  . Hyperlipidemia   . Hypertension   . Myocardial infarction ([place]) 1999  . Osteoarthritis   . Pneumonia    "several times; got a little now" (09/04/2015)  . Rheumatoid arthritis ([place])   . Subdural hematoma (HCC)    small after fall 09/2016-plavix held, neurosurgery consulted.  Asymptomatic.    Marland Kitchen 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[place]) 02/2018   on Lovenox lifelong since he failed PO Eliquis    Past Surgical History:  Procedure Laterality Date  . ARM SKIN LESION BIOPSY / EXCISION Left 09/02/2015  . CATARACT EXTRACTION W/ INTRAOCULAR LENS  IMPLANT, BILATERAL Bilateral   . CHOLECYSTECTOMY OPEN    . CORNEAL TRANSPLANT Bilateral    "one at [facility]; one at [facility]"  . CORONARY ANGIOPLASTY WITH STENT PLACEMENT    . CORONARY ARTERY BYPASS GRAFT  1999  . DESCENDING AORTIC ANEURYSM REPAIR W/ STENT     DE stent ostium into the right radial free graft at OM-, 02-2007  . ESOPHAGOGASTRODUODENOSCOPY N/A 11/09/2014   Procedure: ESOPHAGOGASTRODUODENOSCOPY (EGD);  Surgeon: [name], MD;  Location[facility] ENDOSCOPY;  Service: Endoscopy;  Laterality: N/A;  . INGUINAL HERNIA REPAIR    . JOINT REPLACEMENT    . LEFT HEART CATH AND CORS/GRAFTS ANGIOGRAPHY N/A 06/27/2017   Procedure: LEFT HEART CATH AND CORS/GRAFTS ANGIOGRAPHY;  Surgeon: [name], MD;  Location: [place] CV LAB;  Service: Cardiovascular;  Laterality: N/A;  . NASAL SINUS SURGERY    . SHOULDER OPEN ROTATOR CUFF REPAIR Right   . TOTAL KNEE ARTHROPLASTY Bilateral     Social History:  reports that he has never smoked. He has quit using smokeless tobacco.  His smokeless tobacco use included chew. He reports that he does not drink alcohol or use drugs.  Allergies  Allergen Reactions  . Feldene [Piroxicam] Other (See Comments)    REACTION: Blisters  . Statins Other (See Comments)    Myalgias  . Doxycycline Other (See Comments)    REACTION:  unknown  . Latex Rash  . Tequin Anxiety  . Valium  Other (See Comments)    REACTION:  unknown    Family History  Problem Relation Age of Onset  . Arthritis-Osteo Sister   . Heart attack Brother   . Heart attack Other   . Cancer Neg Hx      Prior to Admission medications   Medication Sig Start Date End Date Taking? Authorizing Provider  acetaminophen (TYLENOL) 325 MG tablet Take 1-2 tablets (325-650 mg total) by mouth every 4 (four) hours as needed for mild pain. 08/09/18  Yes Love, Ivan Anchors, PA-C  aspirin EC 81 MG EC tablet Take 1 tablet (81 mg total) by mouth daily. 08/14/18  Yes Angiulli, Lavon Paganini, PA-C  camphor-menthol [facility]) lotion Apply topically as needed for itching. Patient taking differently: Apply 1 application topically daily as needed for itching.  06/04/18  Yes Thurnell Lose, MD  furosemide (LASIX) 20 MG tablet Take 1 tablet (20 mg total) by mouth daily. 08/14/18  Yes Angiulli, Lavon Paganini, PA-C  Hydrocortisone (GERHARDT'S BUTT CREAM) CREA Apply 5 application topically 2 (two) times  daily. 08/14/18  Yes Angiulli, Lavon Paganini, PA-C  Lifitegrast Shirley Friar) 5 % SOLN Place 1-2 drops into both eyes daily.    Yes [provider]  LORazepam (ATIVAN) 0.5 MG tablet Take 1.5 tablets (0.75 mg total) by mouth 3 (three) times daily. Patient taking differently: Take 0.5 mg by mouth 3 (three) times daily.  08/14/18  Yes Angiulli, Lavon Paganini, PA-C  magnesium oxide (MAG-OX) 400 (241.3 Mg) MG tablet Take 1 tablet (400 mg total) by mouth 2 (two) times daily. 08/14/18  Yes Angiulli, Lavon Paganini, PA-C  metoprolol tartrate (LOPRESSOR) 25 MG tablet Take 0.5 tablets (12.5 mg total) by mouth 2 (two) times daily. 08/14/18  Yes Angiulli, Lavon Paganini, PA-C  nitroGLYCERIN (NITROSTAT) 0.4 MG SL tablet Place 1 tablet (0.4 mg total) under the tongue every 5 (five) minutes as needed for chest pain. 12/06/17  Yes Daune Perch, NP  pantoprazole (PROTONIX) 40 MG tablet Take 1 tablet (40 mg total) by mouth every other day. 08/14/18  Yes Angiulli, Lavon Paganini, PA-C   polyethylene glycol (MIRALAX / GLYCOLAX) 17 g packet Take 17 g by mouth daily as needed for mild constipation. 08/14/18  Yes Angiulli, Lavon Paganini, PA-C  rosuvastatin (CRESTOR) 5 MG tablet Take 1 tablet (5 mg total) by mouth daily at 6 PM. 08/14/18  Yes Angiulli, Lavon Paganini, PA-C  tamsulosin (FLOMAX) 0.4 MG CAPS capsule Take 1 capsule (0.4 mg total) by mouth daily. 08/14/18  Yes Angiulli, Lavon Paganini, PA-C  cycloSPORINE (RESTASIS) 0.05 % ophthalmic emulsion Place 1 drop into both eyes 2 (two) times daily. Patient not taking: Reported on 08/21/2018 08/14/18   Cathlyn Parsons, PA-C    Physical Exam: Vitals:   08/21/18 2145 08/21/18 2200 08/21/18 2215 08/22/18 0029  BP: 118/62 140/72 (!) 150/59 114/70  Pulse: 76 95 79 71  Resp: 12 (!) 25 14 16   Temp:    (!) 97.5 F (36.4 C)  TempSrc:    Oral  SpO2: 98% 100% 98% 100%    Constitutional: Elderly man resting supine in bed, very hard of hearing, appears comfortable and in no acute distress Eyes: PERRL, EOMI, lids and conjunctivae normal ENMT: Mucous membranes are dry. Posterior pharynx clear of any exudate or lesions.Normal dentition.  Neck: normal, supple, no masses. Respiratory: clear to auscultation anteriorly, no wheezing, no crackles. Normal respiratory effort. No accessory muscle use.  Cardiovascular: Regular rate and rhythm, no murmurs / rubs / gallops. No extremity edema. 2+ pedal pulses. Abdomen: no tenderness, no masses palpated. No hepatosplenomegaly. Bowel sounds positive.  Musculoskeletal: no clubbing / cyanosis. No joint deformity upper and lower extremities. Good ROM, no contractures. Normal muscle tone.  Skin: no rashes, lesions, ulcers. No induration Neurologic: Exam limited due to difficulty following commands, strength of all extremities appears intact as well as sensation. Psychiatric: Somnolent but easily awakens, difficult to assess orientation    Labs on Admission: I have personally reviewed following labs and imaging studies   CBC: Recent Labs  Lab 08/21/18 1530  WBC 8.6  NEUTROABS 6.0  HGB 11.4*  HCT 35.3*  MCV 93.9  PLT 962   Basic Metabolic Panel: Recent Labs  Lab 08/21/18 1530  NA 137  K 4.1  CL 103  CO2 22  GLUCOSE 107*  BUN 11  CREATININE 1.26*  CALCIUM 8.8*   GFR: Estimated Creatinine Clearance: 38.1 mL/min (A) (by C-G formula based on SCr of 1.26 mg/dL (H)). Liver Function Tests: Recent Labs  Lab 08/21/18 1530  AST 19  ALT 14  ALKPHOS  120  BILITOT 0.6  PROT 6.4*  ALBUMIN 2.8*   No results for input(s): LIPASE, AMYLASE in the last 168 hours. No results for input(s): AMMONIA in the last 168 hours. Coagulation Profile: No results for input(s): INR, PROTIME in the last 168 hours. Cardiac Enzymes: Recent Labs  Lab 08/21/18 1530  TROPONINI <0.03   BNP (last 3 results) No results for input(s): PROBNP in the last 8760 hours. HbA1C: No results for input(s): HGBA1C in the last 72 hours. CBG: No results for input(s): GLUCAP in the last 168 hours. Lipid Profile: No results for input(s): CHOL, HDL, LDLCALC, TRIG, CHOLHDL, LDLDIRECT in the last 72 hours. Thyroid Function Tests: No results for input(s): TSH, T4TOTAL, FREET4, T3FREE, THYROIDAB in the last 72 hours. Anemia Panel: No results for input(s): VITAMINB12, FOLATE, FERRITIN, TIBC, IRON, RETICCTPCT in the last 72 hours. Urine analysis:    Component Value Date/Time   COLORURINE YELLOW 08/21/2018 1800   APPEARANCEUR CLOUDY (A) 08/21/2018 1800   LABSPEC 1.008 08/21/2018 1800   PHURINE 7.0 08/21/2018 1800   GLUCOSEU NEGATIVE 08/21/2018 1800   HGBUR SMALL (A) 08/21/2018 1800   BILIRUBINUR NEGATIVE 08/21/2018 1800   KETONESUR NEGATIVE 08/21/2018 1800   PROTEINUR NEGATIVE 08/21/2018 1800   UROBILINOGEN 0.2 07/13/2014 1804   NITRITE POSITIVE (A) 08/21/2018 1800   LEUKOCYTESUR LARGE (A) 08/21/2018 1800    Radiological Exams on Admission: Ct Head Wo Contrast  Result Date: 08/21/2018 CLINICAL DATA:  Recent fall. EXAM: CT  HEAD WITHOUT CONTRAST TECHNIQUE: Contiguous axial images were obtained from the base of the skull through the vertex without intravenous contrast. COMPARISON:  07/30/2018 FINDINGS: Brain: Interval evolution of left thalamic hemorrhagic infarct. Hemorrhagic component has resolved in the interval. No hydrocephalus or midline shift. No abnormal extra-axial fluid collection. No CT evidence for acute ischemia. Diffuse loss of parenchymal volume is consistent with atrophy. Patchy low attenuation in the deep hemispheric and periventricular white matter is nonspecific, but likely reflects chronic microvascular ischemic demyelination. Lacunar infarct noted right cerebellum. Vascular: No hyperdense vessel or unexpected calcification. Skull: No evidence for fracture. No worrisome lytic or sclerotic lesion. Sinuses/Orbits: Chronic left maxillary sinusitis. Surgical changes right mastoid air cells. Visualized portions of the globes and intraorbital fat are unremarkable. Other: None. IMPRESSION: 1. Interval evolution of the left thalamic infarct. No new or acute intracranial abnormality on today's study. 2. Atrophy with chronic small vessel white matter ischemic disease. Electronically Signed   By: Misty Stanley M.D.   On: 08/21/2018 18:21   Dg Chest Port 1 View  Result Date: 08/21/2018 CLINICAL DATA:  Shortness of breath.  Pneumonia. EXAM: PORTABLE CHEST 1 VIEW COMPARISON:  08/02/2018 FINDINGS: The cardiac silhouette is enlarged. The patient is status post prior median sternotomy. There are small bilateral pleural effusions. There is a right lower lobe airspace opacity. The previously noted left lower lobe airspace opacity has improved. There is no pneumothorax. IMPRESSION: 1. Cardiomegaly. The patient is status post prior median sternotomy. 2. Trace to small bilateral pleural effusions. 3. Improved aeration at the left lung base. Persistent opacity at the right lung base favored to represent atelectasis with infiltrate not  entirely excluded. Electronically Signed   By: Constance Holster M.D.   On: 08/21/2018 16:16    EKG: Independently reviewed.  Atrial fibrillation without acute ischemic changes.  Assessment/Plan Principal Problem:   Altered mental status Active Problems:   Hyperlipidemia   Hypertension   Anxiety   Atrial fibrillation (HCC)   Thalamic hemorrhage (HCC)   UTI (urinary tract  infection)  Evan [name] is a 85 y.o. 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   AMS/lethargy secondary to UTI: Suspect symptoms related to UTI.  Portable chest x-ray shows interval improved aeration at the left lung base with persistent opacity of the right lung base.  Will obtain 2 view chest x-ray in the morning to assess for aspiration pneumonia given reported cough and shortness of breath.  Appears volume depleted on exam, will give IV fluids and hold home Lasix. -Continue IV ceftriaxone -Follow-up urine culture  Recent left thalamic intracranial hemorrhage: CT head without contrast showed interval evolution of the left thalamic infarct without new or acute intracranial abnormality -Continue aspirin 81 mg per prior neurology recommendations -No longer on anticoagulation for A. Fib -PT eval  Paroxysmal atrial fibrillation: In atrial fibrillation on admission with controlled rate. -Continue Lopressor -Not on anticoagulation due to recent ICH  Hypertension: Currently stable.  Continue Lopressor.  Hyperlipidemia: -Continue Crestor  Anxiety: Was on scheduled Ativan during recent admission.  Per medication reconciliation, daughter has been rationing small  supply of Ativan prescribed on discharge from inpatient rehab.  Question whether lethargy/generalized weakness may be side effect from Ativan. -Obtain UDS -Hold Ativan tonight   DVT prophylaxis: SCDs  Code Status: Full code Family Communication: None present on admission Disposition Plan: Pending clinical progress Consults called: None Admission status: Observation   Zada Finders MD [facility]  If 7PM-7AM, please contact night-coverage [email or web address]  08/22/2018, 1:09 AM

## 2018-08-21 NOTE — ED Triage Notes (Signed)
Pt also has hx of wounds being treated by [facility] which are not improving per EMS report.

## 2018-08-21 NOTE — ED Notes (Signed)
Blue sheet, greyish pillowcase/ pillow brought with pt wants to make sure when discharged these things remain with pt

## 2018-08-21 NOTE — ED Triage Notes (Signed)
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

## 2018-08-22 DIAGNOSIS — N390 Urinary tract infection, site not specified: Principal | ICD-10-CM

## 2018-08-22 DIAGNOSIS — I129 Hypertensive chronic kidney disease with stage 1 through stage 4 chronic kidney disease, or unspecified chronic kidney disease: Secondary | ICD-10-CM | POA: Diagnosis present

## 2018-08-22 DIAGNOSIS — I610 Nontraumatic intracerebral hemorrhage in hemisphere, subcortical: Secondary | ICD-10-CM

## 2018-08-22 DIAGNOSIS — Z8719 Personal history of other diseases of the digestive system: Secondary | ICD-10-CM | POA: Diagnosis not present

## 2018-08-22 DIAGNOSIS — Z515 Encounter for palliative care: Secondary | ICD-10-CM

## 2018-08-22 DIAGNOSIS — R531 Weakness: Secondary | ICD-10-CM

## 2018-08-22 DIAGNOSIS — Z7189 Other specified counseling: Secondary | ICD-10-CM | POA: Diagnosis not present

## 2018-08-22 DIAGNOSIS — Z9841 Cataract extraction status, right eye: Secondary | ICD-10-CM | POA: Diagnosis not present

## 2018-08-22 DIAGNOSIS — E869 Volume depletion, unspecified: Secondary | ICD-10-CM | POA: Diagnosis present

## 2018-08-22 DIAGNOSIS — R4182 Altered mental status, unspecified: Secondary | ICD-10-CM | POA: Diagnosis not present

## 2018-08-22 DIAGNOSIS — I252 Old myocardial infarction: Secondary | ICD-10-CM | POA: Diagnosis not present

## 2018-08-22 DIAGNOSIS — G9341 Metabolic encephalopathy: Secondary | ICD-10-CM | POA: Diagnosis present

## 2018-08-22 DIAGNOSIS — Z955 Presence of coronary angioplasty implant and graft: Secondary | ICD-10-CM | POA: Diagnosis not present

## 2018-08-22 DIAGNOSIS — I4891 Unspecified atrial fibrillation: Secondary | ICD-10-CM | POA: Diagnosis not present

## 2018-08-22 DIAGNOSIS — R0902 Hypoxemia: Secondary | ICD-10-CM | POA: Diagnosis not present

## 2018-08-22 DIAGNOSIS — Z9842 Cataract extraction status, left eye: Secondary | ICD-10-CM | POA: Diagnosis not present

## 2018-08-22 DIAGNOSIS — I480 Paroxysmal atrial fibrillation: Secondary | ICD-10-CM

## 2018-08-22 DIAGNOSIS — F329 Major depressive disorder, single episode, unspecified: Secondary | ICD-10-CM | POA: Diagnosis present

## 2018-08-22 DIAGNOSIS — B9620 Unspecified Escherichia coli [E. coli] as the cause of diseases classified elsewhere: Secondary | ICD-10-CM | POA: Diagnosis present

## 2018-08-22 DIAGNOSIS — Z951 Presence of aortocoronary bypass graft: Secondary | ICD-10-CM | POA: Diagnosis not present

## 2018-08-22 DIAGNOSIS — Z1159 Encounter for screening for other viral diseases: Secondary | ICD-10-CM | POA: Diagnosis not present

## 2018-08-22 DIAGNOSIS — H9190 Unspecified hearing loss, unspecified ear: Secondary | ICD-10-CM | POA: Diagnosis present

## 2018-08-22 DIAGNOSIS — N400 Enlarged prostate without lower urinary tract symptoms: Secondary | ICD-10-CM | POA: Diagnosis present

## 2018-08-22 DIAGNOSIS — I5032 Chronic diastolic (congestive) heart failure: Secondary | ICD-10-CM | POA: Diagnosis not present

## 2018-08-22 DIAGNOSIS — E785 Hyperlipidemia, unspecified: Secondary | ICD-10-CM

## 2018-08-22 DIAGNOSIS — J449 Chronic obstructive pulmonary disease, unspecified: Secondary | ICD-10-CM | POA: Diagnosis not present

## 2018-08-22 DIAGNOSIS — N182 Chronic kidney disease, stage 2 (mild): Secondary | ICD-10-CM | POA: Diagnosis present

## 2018-08-22 DIAGNOSIS — Z961 Presence of intraocular lens: Secondary | ICD-10-CM | POA: Diagnosis present

## 2018-08-22 DIAGNOSIS — N183 Chronic kidney disease, stage 3 (moderate): Secondary | ICD-10-CM | POA: Diagnosis not present

## 2018-08-22 DIAGNOSIS — F419 Anxiety disorder, unspecified: Secondary | ICD-10-CM | POA: Diagnosis present

## 2018-08-22 DIAGNOSIS — R0602 Shortness of breath: Secondary | ICD-10-CM | POA: Diagnosis present

## 2018-08-22 DIAGNOSIS — G8929 Other chronic pain: Secondary | ICD-10-CM | POA: Diagnosis present

## 2018-08-22 DIAGNOSIS — K219 Gastro-esophageal reflux disease without esophagitis: Secondary | ICD-10-CM | POA: Diagnosis present

## 2018-08-22 DIAGNOSIS — I2510 Atherosclerotic heart disease of native coronary artery without angina pectoris: Secondary | ICD-10-CM | POA: Diagnosis present

## 2018-08-22 LAB — BASIC METABOLIC PANEL
Anion gap: 12 (ref 5–15)
BUN: 11 mg/dL (ref 8–23)
CO2: 20 mmol/L — ABNORMAL LOW (ref 22–32)
Calcium: 8.8 mg/dL — ABNORMAL LOW (ref 8.9–10.3)
Chloride: 103 mmol/L (ref 98–111)
Creatinine, Ser: 1.2 mg/dL (ref 0.61–1.24)
GFR calc Af Amer: >60 mL/min (ref >60)
GFR calc non Af Amer: 54 mL/min — ABNORMAL LOW (ref >60)
Glucose, Bld: 104 mg/dL — ABNORMAL HIGH (ref 70–99)
Potassium: 3.8 mmol/L (ref 3.5–5.1)
Sodium: 135 mmol/L (ref 135–145)

## 2018-08-22 LAB — RAPID URINE DRUG SCREEN, HOSP PERFORMED
Amphetamines: NOT DETECTED
Barbiturates: NOT DETECTED
Benzodiazepines: POSITIVE — AB
Cocaine: NOT DETECTED
Opiates: NOT DETECTED
Tetrahydrocannabinol: NOT DETECTED

## 2018-08-22 LAB — CBC
HCT: 33 % — ABNORMAL LOW (ref 39.0–52.0)
Hemoglobin: 10.8 g/dL — ABNORMAL LOW (ref 13.0–17.0)
MCH: 30.4 pg (ref 26.0–34.0)
MCHC: 32.7 g/dL (ref 30.0–36.0)
MCV: 93 fL (ref 80.0–100.0)
Platelets: 171 10*3/uL (ref 150–400)
RBC: 3.55 MIL/uL — ABNORMAL LOW (ref 4.22–5.81)
RDW: 13.8 % (ref 11.5–15.5)
WBC: 7.8 10*3/uL (ref 4.0–10.5)
nRBC: 0 % (ref 0.0–0.2)

## 2018-08-22 MED ORDER — PANTOPRAZOLE SODIUM 40 MG PO TBEC
40.0000 mg | DELAYED_RELEASE_TABLET | ORAL | Status: DC
  Administered 2018-08-22 – 2018-08-24 (×2): 40 mg via ORAL
  Filled 2018-08-22 (×2): qty 1

## 2018-08-22 MED ORDER — ASPIRIN EC 81 MG PO TBEC
81.0000 mg | DELAYED_RELEASE_TABLET | Freq: Every day | ORAL | Status: DC
  Administered 2018-08-22 – 2018-08-24 (×3): 81 mg via ORAL
  Filled 2018-08-22 (×3): qty 1

## 2018-08-22 MED ORDER — SODIUM CHLORIDE 0.9 % IV BOLUS
1000.0000 mL | Freq: Once | INTRAVENOUS | Status: AC
  Administered 2018-08-22: 1000 mL via INTRAVENOUS

## 2018-08-22 NOTE — Evaluation (Signed)
Clinical/Bedside Swallow Evaluation Patient Details  Name: Evan [name] MRN: [id number] Date of Birth: 02-16-1928  Today's Date: 08/22/2018 Time: SLP Start Time (ACUTE ONLY): 1420 SLP Stop Time (ACUTE ONLY): 1450 SLP Time Calculation (min) (ACUTE ONLY): 30 min  Past Medical History:  Past Medical History:  Diagnosis Date  . Adenocarcinoma of right lung ([place]) 01/06/2016  . Anginal chest pain at rest [facility])    Chronic non, controlled on Lorazepam  . Anxiety   . Arthritis    "all over"   . BPH (benign prostatic hyperplasia)   . CAD (coronary artery disease)   . Chronic lower back pain   . Complication of anesthesia    "problems making water afterwards"  . Depression   . DJD (degenerative joint disease)   . Esophageal ulcer without bleeding    bid ppi indefinitely  . Family history of adverse reaction to anesthesia    "children w/PONV"  . GERD (gastroesophageal reflux disease)   . Headache    "couple/week maybe" (09/04/2015)  . Hemochromatosis    Possible, elevated iron stores, hook-like osteophytes on hand films, normal LFTs  . Hepatitis    "yellow jaundice as a baby"  . History of ASCVD    MULTIVESSEL  . Hyperlipidemia   . Hypertension   . Myocardial infarction ([place]) 1999  . Osteoarthritis   . Pneumonia    "several times; got a little now" (09/04/2015)  . Rheumatoid arthritis ([place])   . Subdural hematoma (HCC)    small after fall 09/2016-plavix held, neurosurgery consulted.  Asymptomatic.    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 ARM SKIN LESION BIOPSY / EXCISION Left 09/02/2015  . CATARACT EXTRACTION W/ INTRAOCULAR LENS  IMPLANT, BILATERAL Bilateral   . CHOLECYSTECTOMY OPEN    . CORNEAL TRANSPLANT Bilateral    "one at [facility]; one at [facility]"  . CORONARY ANGIOPLASTY WITH STENT PLACEMENT    . CORONARY ARTERY BYPASS GRAFT  1999  . DESCENDING AORTIC ANEURYSM  REPAIR W/ STENT     DE stent ostium into the right radial free graft at OM-, 02-2007  . ESOPHAGOGASTRODUODENOSCOPY N/A 11/09/2014   Procedure: ESOPHAGOGASTRODUODENOSCOPY (EGD);  Surgeon: [name], MD;  Location: [facility] ENDOSCOPY;  Service: Endoscopy;  Laterality: N/A;  . INGUINAL HERNIA REPAIR    . JOINT REPLACEMENT    . LEFT HEART CATH AND CORS/GRAFTS ANGIOGRAPHY N/A 06/27/2017   Procedure: LEFT HEART CATH AND CORS/GRAFTS ANGIOGRAPHY;  Surgeon: [name], MD;  Location: [place] CV LAB;  Service: Cardiovascular;  Laterality: N/A;  . NASAL SINUS SURGERY    . SHOULDER OPEN ROTATOR CUFF REPAIR Right   . TOTAL KNEE ARTHROPLASTY Bilateral    HPI:  Pt is an 85 y/o male admitted secondary to AMS and SOB. AMS likely secondary to UTI. CT revealed evolution of L thalamic infarct. PMH includes MI, CAD, A fib, HTN, CVA with R sided deficits, PE, and R lung adenocarcinoma.  Patient was recently discharged home after an extended CIR stay (4/14-5/12) and was discharged home with family and [place] SLP, on Dys 2, nectar thick liquids diet.    Assessment / Plan / Recommendation Clinical Impression  Patient presents with a mild oropharyngeal dysphagia characterized by delayed swallow initiation with thin liquids and nectar thick liquids, but no overt s/s of aspiration or peneteration. Patient has h/o dysphagia and during recent CIR stay, was discharged  home on Dys 2, Nectar thick liquids and HH SLP. As patient has had reported difficulties with impulsivity leading to overt s/s of aspiration and penetration during PO intake, plan is to continue on Dys 2 Nectar thick liquids for today (5/20) and complete objective swallow evaluation via an MBSS tomorrow (5/21) to determine swallow function and make recommendations for solid and liquid consistencies.  SLP Visit Diagnosis: Dysphagia, unspecified (R13.10)    Aspiration Risk  Mild aspiration risk    Diet Recommendation Dysphagia 2 (Fine chop);Nectar-thick liquid    Liquid Administration via: Cup;Straw Medication Administration: Whole meds with liquid Supervision: Patient able to self feed;Intermittent supervision to cue for compensatory strategies Compensations: Minimize environmental distractions;Slow rate;Small sips/bites Postural Changes: Seated upright at 90 degrees    Other  Recommendations Oral Care Recommendations: Oral care BID   Follow up Recommendations Skilled Nursing facility;24 hour supervision/assistance;Home health SLP      Frequency and Duration min 2x/week  1 week       Prognosis        Swallow Study   General Date of Onset: 08/21/18 HPI: Pt is an 85 y/o male admitted secondary to AMS and SOB. AMS likely secondary to UTI. CT revealed evolution of L thalamic infarct. PMH includes MI, CAD, A fib, HTN, CVA with R sided deficits, PE, and R lung adenocarcinoma.  Patient was recently discharged home after an extended CIR stay (4/14-5/12) and was discharged home with family and [place] SLP, on Dys 2, nectar thick liquids diet.  Type of Study: Bedside Swallow Evaluation Previous Swallow Assessment: BSE on acute and during CIR stay Diet Prior to this Study: Dysphagia 2 (chopped);Nectar-thick liquids Temperature Spikes Noted: No History of Recent Intubation: No Behavior/Cognition: Alert;Cooperative;Pleasant mood Oral Cavity Assessment: Within Functional Limits Oral Care Completed by SLP: Yes Oral Cavity - Dentition: Adequate natural dentition Vision: Functional for self-feeding Self-Feeding Abilities: Able to feed self;Needs set up Patient Positioning: Upright in bed Baseline Vocal Quality: Normal Volitional Cough: Weak Volitional Swallow: Able to elicit    Oral/Motor/Sensory Function Overall Oral Motor/Sensory Function: Within functional limits   Ice Chips     Thin Liquid Thin Liquid: Impaired Presentation: Straw Pharyngeal  Phase Impairments: Suspected delayed Swallow    Nectar Thick Nectar Thick Liquid:  Impaired Presentation: Straw Pharyngeal Phase Impairments: Suspected delayed Swallow   Honey Thick Honey Thick Liquid: Not tested   Puree Puree: Within functional limits Presentation: Spoon   Solid     Solid: Not tested      Dannial Monarch 08/22/2018,5:29 PM   Sonia Baller, MA, [place] [facility] Pager: [phone]

## 2018-08-22 NOTE — Progress Notes (Signed)
Pt received to 5W 34.  Pt was oriented to unit and fall education completed.  Pt demonstrate understanding but with difficulty. Skin assessed and the following concerns noted:  -   3 dime shaped abrasion to l buttocks  - 2 dime shaped abrasion to R  buttocks - DTI  to R heel   - Abrasion to R upper arm - scattered minor abrasion / scalps and ecchymosis    To monitor and treat pt per MD and nursing orders

## 2018-08-22 NOTE — Plan of Care (Signed)
  Problem: Education: °Goal: Knowledge of General Education information will improve °Description: Including pain rating scale, medication(s)/side effects and non-pharmacologic comfort measures °Outcome: Not Met (add Reason) °  °Problem: Health Behavior/Discharge Planning: °Goal: Ability to manage health-related needs will improve °Outcome: Not Met (add Reason) °  °Problem: Clinical Measurements: °Goal: Ability to maintain clinical measurements within normal limits will improve °Outcome: Not Met (add Reason) °Goal: Will remain free from infection °Outcome: Not Met (add Reason) °Goal: Diagnostic test results will improve °Outcome: Not Met (add Reason) °Goal: Respiratory complications will improve °Outcome: Not Met (add Reason) °Goal: Cardiovascular complication will be avoided °Outcome: Not Met (add Reason) °  °Problem: Activity: °Goal: Risk for activity intolerance will decrease °Outcome: Not Met (add Reason) °  °Problem: Nutrition: °Goal: Adequate nutrition will be maintained °Outcome: Not Met (add Reason) °  °Problem: Coping: °Goal: Level of anxiety will decrease °Outcome: Not Met (add Reason) °  °Problem: Elimination: °Goal: Will not experience complications related to bowel motility °Outcome: Not Met (add Reason) °Goal: Will not experience complications related to urinary retention °Outcome: Not Met (add Reason) °  °Problem: Pain Managment: °Goal: General experience of comfort will improve °Outcome: Not Met (add Reason) °  °Problem: Safety: °Goal: Ability to remain free from injury will improve °Outcome: Not Met (add Reason) °  °Problem: Skin Integrity: °Goal: Risk for impaired skin integrity will decrease °Outcome: Not Met (add Reason) °  °

## 2018-08-22 NOTE — Consult Note (Signed)
Consultation Note Date: 08/22/2018   Patient Name: Evan [name]  DOB: [birth year]  MRN: [id number]  Age / Sex: 85 y.o., male   PCP: Donita Brooks, MD Referring Physician: Glade Lloyd, MD   REASON FOR CONSULTATION:Establishing goals of care  Palliative Care consult requested for this 85 y.o. male with multiple medical problems including defibrillation (no anticoagulation), PE, stage I right lung adenocarcinoma S/B SBRT, radiation fibrosis, hypertension, chronic kidney disease stage II, hyperlipidemia, hard of hearing, anxiety, depression, coronary artery disease, BPH, and MI.  Patient was recently admitted in April 2020 with a small left traumatic intracranial hemorrhage.  He was discharged to inpatient rehab (07/16/18-08/15/18) and then to home with family.  He was admitted after EMS was called due to shortness of breath.  X-ray showed trace bilateral pleural effusions.  Patient was also positive for UTI and started on IV ceftriaxone.  COVID test negative.  Clinical Assessment and Goals of Care: I have reviewed medical records including lab results, imaging, Epic notes, and MAR, received report from the bedside RN, and assessed the patient.  I spoke with patient's daughter/poa, Berna Spare via phone to discuss diagnosis prognosis, GOC, EOL wishes, disposition and options.  Patient is awake, alert, but extremely hard of hearing.  He requested all conversations and commands be directed to his right side.  He was alert to himself.  Able to identify his name, date of birth, the year, and his family members.  I introduced Palliative Medicine as specialized medical care for people living with serious illness. It focuses on providing relief from the symptoms and stress of a serious illness. The goal is to improve quality of life for both the patient and the family.  Daughter initially reluctant to engage in goals of care discussion stating she had refused care previously.  I further  explained to her our goals as a medical team and that we are here to support the patient and the family.  Daughter agreed to continue with conversation.  We discussed a brief life review of the patient, along with his overall functional and nutritional status.  Daughter states patient has 5 children.  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 She reports he loves reading especially the daily newspaper.   She shares that patient's health has declined over the past 3-6 months. She shares his memory has worsened, although it is much improved when he is not sick. He is able to recognize his family, and recently knew what bills around the house needed to be paid. She reports after leaving CIR he was receiving in home rehab and was tolerating to some extent. Does endorse some days he would be more fatigued. She reports he was able to get out of the bed with assistance and use of a walker at times. He required assistance with ADLs. She states he did have a good appetite if nothing else was going well.   We discussed His current illness and what it means in the larger context of His on-going co-morbidities. With specific discussions regarding recurrent UTI, ICH, deconditioning, and respiratory.  Natural disease trajectory and expectations at EOL were discussed. Daughter verbalized her understanding and appreciation of updates. She expressed she felt patient suffered from a lot of anxiety also especially when he was tired and fatigued. She reports he would often began feeling short of breath which would exacerbate his anxiety. She states his home PT suggested he may require oxygen in home as  several times when checked he would be in the high 80s low 90s. She mentions awareness of his radiation fibrosis and that his medical team is monitoring his lungs for reoccurrence of cancer.   I attempted to elicit values and goals of care important to the patient.    The difference between aggressive  medical intervention and comfort care was considered in light of the patient's goals of care. Daughter expressed her and her siblings remains hopeful patient will show some signs of improvement. She expressed their goal is to allow him every opportunity to do this. I attempted to explain his continued signs of decline and deconditioning despite extensive attempts at rehabilitation over the past months. She verbalized understanding and is tearful in stating she knows he is becoming weaker but they have discussed as a family over the past few days and they still have to give him a chance.   Daughter expresses patient does have an advanced directives. She reports she is the medical decision maker, and shares all information with her siblings for further discussions. I discussed in detail his full code status with consideration given to his current illness, continuous signs of decline and deconditioning, and co-morbidities. Daughter expressed they have been in discussion as a family about this and at this time they wish to continue with full code knowing risk and poor prognosis in the event of an emergent event. She expressed "we know we can change our minds at anytime, and if we do find ourselves in a situation, once we know all of the details and his chances we will make a decision then".  Again she requested for him to remain a full code.   Hospice and Palliative Care services outpatient were explained and offered. At this time given family's wishes to continue to treat aggressively I advised daughter that having palliative involve would be recommended for support.  She verbalized her understanding and awareness of both palliative and hospice's goals and philosophy of care, expressing she and her siblings would ok with Palliative's support but not interested in hospice at this time. Advised I would place a referral for outpatient palliative once discharged and educated her that if patient continues to show no  signs of improvement she and her siblings may transition care to hospice by discussing with the outpatient team.   Questions and concerns were addressed. The family was encouraged to call with questions or concerns.  PMT will continue to support holistically.   SOCIAL HISTORY:     reports that he has never smoked. He has quit using smokeless tobacco.  His smokeless tobacco use included chew. He reports that he does not drink alcohol or use drugs.  CODE STATUS: Full code  ADVANCE DIRECTIVES: Primary Decision Maker: Berna Spare    SYMPTOM MANAGEMENT:Per attending   Palliative Prophylaxis:   Aspiration, Bowel Regimen, Delirium Protocol, Frequent Pain Assessment and Palliative Wound Care  PSYCHO-SOCIAL/SPIRITUAL:  Support System: Family   Desire for further Chaplaincy support:NO   Additional Recommendations (Limitations, Scope, Preferences):  Full Scope Treatment   PAST MEDICAL HISTORY: Past Medical History:  Diagnosis Date  . 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  . Anginal chest pain at rest [facility])    Chronic non, controlled on Lorazepam  . Anxiety   . Arthritis    "all over"   . BPH (benign prostatic hyperplasia)   . CAD (coronary artery disease)   . Chronic lower back pain   . Complication of anesthesia    "problems making  water afterwards"  . Depression   . DJD (degenerative joint disease)   . Esophageal ulcer without bleeding    bid ppi indefinitely  . Family history of adverse reaction to anesthesia    "children w/PONV"  . GERD (gastroesophageal reflux disease)   . Headache    "couple/week maybe" (09/04/2015)  . Hemochromatosis    Possible, elevated iron stores, hook-like osteophytes on hand films, normal LFTs  . Hepatitis    "yellow jaundice as a baby"  . History of ASCVD    MULTIVESSEL  . Hyperlipidemia   . Hypertension   . Myocardial infarction (HCC) 1999  . Osteoarthritis   . Pneumonia    "several times; got a little now" (09/04/2015)  .  Rheumatoid arthritis (HCC)   . Subdural hematoma (HCC)    small after fall 09/2016-plavix held, neurosurgery consulted.  Asymptomatic.    Marland Kitchen 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 he failed PO Eliquis    PAST SURGICAL HISTORY:  Past Surgical History:  Procedure Laterality Date  . ARM SKIN LESION BIOPSY / EXCISION Left 09/02/2015  . CATARACT EXTRACTION W/ INTRAOCULAR LENS  IMPLANT, BILATERAL Bilateral   . CHOLECYSTECTOMY OPEN    . CORNEAL TRANSPLANT Bilateral    "one at [facility]; one at [facility]"  . CORONARY ANGIOPLASTY WITH STENT PLACEMENT    . CORONARY ARTERY BYPASS GRAFT  1999  . DESCENDING AORTIC ANEURYSM REPAIR W/ STENT     DE stent ostium into the right radial free graft at OM-, 02-2007  . ESOPHAGOGASTRODUODENOSCOPY N/A 11/09/2014   Procedure: ESOPHAGOGASTRODUODENOSCOPY (EGD);  Surgeon: Dorena Cookey, MD;  Location: [facility] ENDOSCOPY;  Service: Endoscopy;  Laterality: N/A;  . INGUINAL HERNIA REPAIR    . JOINT REPLACEMENT    . LEFT HEART CATH AND CORS/GRAFTS ANGIOGRAPHY N/A 06/27/2017   Procedure: LEFT HEART CATH AND CORS/GRAFTS ANGIOGRAPHY;  Surgeon: Lennette Bihari, MD;  Location: [facility] INVASIVE CV LAB;  Service: Cardiovascular;  Laterality: N/A;  . NASAL SINUS SURGERY    . SHOULDER OPEN ROTATOR CUFF REPAIR Right   . TOTAL KNEE ARTHROPLASTY Bilateral     ALLERGIES:  is allergic to feldene [piroxicam]; statins; doxycycline; latex; tequin; and valium.   MEDICATIONS:  Current Facility-Administered Medications  Medication Dose Route Frequency Provider Last Rate Last Dose  . acetaminophen (TYLENOL) tablet 650 mg  650 mg Oral Q6H PRN Charlsie Quest, MD       Or  . acetaminophen (TYLENOL) suppository 650 mg  650 mg Rectal Q6H PRN Charlsie Quest, MD      . aspirin EC tablet 81 mg  81 mg Oral Daily Charlsie Quest, MD   81 mg at 08/22/18 0956  . cefTRIAXone (ROCEPHIN) 1 g in sodium chloride 0.9 % 100 mL IVPB  1 g Intravenous Q24H Patel, Vishal R, MD      . Lifitegrast 5 % SOLN 1-2  drop  1-2 drop Both Eyes Daily Charlsie Quest, MD   Stopped at 08/22/18 0009  . metoprolol tartrate (LOPRESSOR) tablet 12.5 mg  12.5 mg Oral BID Darreld Mclean R, MD   12.5 mg at 08/22/18 0957  . pantoprazole (PROTONIX) EC tablet 40 mg  40 mg Oral Elberta Spaniel, MD   40 mg at 08/22/18 0957  . rosuvastatin (CRESTOR) tablet 5 mg  5 mg Oral q1800 Darreld Mclean R, MD   5 mg at 08/22/18 0008  . senna-docusate (Senokot-S) tablet 1 tablet  1 tablet Oral QHS PRN Charlsie Quest, MD      .  tamsulosin (FLOMAX) capsule 0.4 mg  0.4 mg Oral Daily Darreld Mclean R, MD   0.4 mg at 08/22/18 0957    VITAL SIGNS: BP 121/79 (BP Location: Left Arm)   Pulse 70   Temp 97.8 F (36.6 C) (Oral)   Resp 16   Ht 5\' 5"  (1.651 m)   Wt 71.2 kg   SpO2 99%   BMI 26.12 kg/m  Filed Weights   08/22/18 0029  Weight: 71.2 kg    Estimated body mass index is 26.12 kg/m as calculated from the following:   Height as of this encounter: 5\' 5"  (1.651 m).   Weight as of this encounter: 71.2 kg.  LABS: CBC:    Component Value Date/Time   WBC 7.8 08/22/2018 0243   HGB 10.8 (L) 08/22/2018 0243   HGB 13.4 08/16/2016 1155   HCT 33.0 (L) 08/22/2018 0243   HCT 39.8 08/16/2016 1155   PLT 171 08/22/2018 0243   PLT 81 (L) 08/16/2016 1155   Comprehensive Metabolic Panel:    Component Value Date/Time   NA 135 08/22/2018 0243   NA 143 08/16/2016 1155   K 3.8 08/22/2018 0243   K 4.3 08/16/2016 1155   CO2 20 (L) 08/22/2018 0243   CO2 24 08/16/2016 1155   BUN 11 08/22/2018 0243   BUN 14.6 08/16/2016 1155   CREATININE 1.20 08/22/2018 0243   CREATININE 1.35 (H) 04/20/2018 1048   CREATININE 1.2 08/16/2016 1155   ALBUMIN 2.8 (L) 08/21/2018 1530   ALBUMIN 3.6 08/16/2016 1155     Review of Systems   Neurological: Positive for weakness.  All other systems reviewed and are negative.  Physical Exam General: NAD, chronically-ill frail appearing, thin Cardiovascular: regular rate and rhythm Pulmonary: diminished  bases, crackles  Abdomen: soft, nontender, + bowel sounds Extremities: no edema, no joint deformities Skin: no rashes Neurological: Weakness, awake, alert, some confusion   Prognosis: Guarded to Poor in the setting metabolic encephalopathy, recent left thalamic ICH, generalized deconditioning, hypertension, anxiety, and A-fib.   Discharge Planning:  To Be Determined with outpatient palliative   Recommendations:  Full Code-at request of daughter/poa  Continue to treat the treatable, with aggressive interventions.   Family remains hopeful for some improvement.   Family agrees to outpatient palliative once discharged  PMT will continue to support and follow as needed    Palliative Performance Scale: PPS 30%               Daughter, Victorio Palm expressed understanding and was in agreement with this plan.   Thank you for allowing the Palliative Medicine Team to assist in the care of this patient.  Time In: 1405 Time Out: 1515 Time Total: 70 min.   Visit consisted of counseling and education dealing with the complex and emotionally intense issues of symptom management and palliative care in the setting of serious and potentially life-threatening illness.Greater than 50%  of this time was spent counseling and coordinating care related to the above assessment and plan.  Signed by:  Willette Alma, AGPCNP-BC Palliative Medicine Team  Phone: [phone] Fax: [phone] Pager: [phone] Amion: Thea Alken

## 2018-08-22 NOTE — Evaluation (Signed)
Physical Therapy Evaluation Patient Details Name: Evan [name] MRN: [id number] DOB: Dec 07, 1927 Today's Date: 08/22/2018   History of Present Illness  Pt is an 85 y/o male admitted secondary to AMS and SOB. AMS likely secondary to UTI. CT revealed evolution of L thalamic infarct. PMH includes MI, CAD, A fib, HTN, CVA with R sided deficits, PE, and R lung adenocarcinoma.   Clinical Impression  Pt admitted secondary to problem above with deficits below. Pt requiring mod A to stand and required manual blocking of RLE as it tended to have excessive extension. Unable to take steps towards chair with +1 assist. Pt was requiring assist prior to admission, so pt may be close to baseline. If pt has adequate support and is close to baseline, feel he could return home with HHPT and 24/7 support. If family unable to provide support, feel he will require SNF level therapies. Will continue to follow acutely to maximize functional mobility independence and safety.     Follow Up Recommendations SNF;Supervision/Assistance - 24 hour(however, if pt close to baseline, can go home with HHPT)    Equipment Recommendations  None recommended by PT    Recommendations for Other Services       Precautions / Restrictions Precautions Precautions: Fall Restrictions Weight Bearing Restrictions: No      Mobility  Bed Mobility Overal bed mobility: Needs Assistance Bed Mobility: Supine to Sit;Sit to Supine     Supine to sit: Min assist Sit to supine: Min assist   General bed mobility comments: Min A for trunk elevation to come to sitting. Min A for LE assist for return to supine.   Transfers Overall transfer level: Needs assistance Equipment used: Rolling walker (2 wheeled);1 person hand held assist Transfers: Sit to/from Stand Sit to Stand: Mod assist         General transfer comment: Stood with RW X1, however, pt's RLE with extensor tone and unable to take steps. Performed standing 2nd time with PT  standing in front of pt and PT blocked RLE to stand. Mod A for lift assist and steadying. Pt unable to take steps towards chair without +2 assist.   Ambulation/Gait                Stairs            Wheelchair Mobility    Modified Rankin (Stroke Patients Only)       Balance Overall balance assessment: Needs assistance Sitting-balance support: No upper extremity supported;Feet supported Sitting balance-[name] Scale: Fair     Standing balance support: During functional activity;Single extremity supported Standing balance-[name] Scale: Poor Standing balance comment: Reliant on LUE and external support to stand.                              Pertinent Vitals/Pain Pain Assessment: No/denies pain    Home Living Family/patient expects to be discharged to:: Private residence Living Arrangements: Children Available Help at Discharge: Family;Available 24 hours/day Type of Home: House Home Access: Ramped entrance     Home Layout: Two level;Laundry or work area in basement;Able to live on main level with bedroom/bathroom Home Equipment: Environmental consultant - 2 wheels;Shower seat;Cane - single point;Wheelchair - manual      Prior Function Level of Independence: Needs assistance   Gait / Transfers Assistance Needed: Pt required assist for transfers to/from [facility]. Pt also required assist with WC mobility.   ADL's / Homemaking Assistance Needed: Pt required assist with ADL  tasks.         Hand Dominance        Extremity/Trunk Assessment   Upper Extremity Assessment Upper Extremity Assessment: Defer to OT evaluation(RUE deficits at baseline )    Lower Extremity Assessment Lower Extremity Assessment: RLE deficits/detail RLE Deficits / Details: WEakness at baseline. RLE also transitions into extension with any mobility. Very sporadic movements of RLE noted during mobility tasks.    Cervical / Trunk Assessment Cervical / Trunk Assessment: Kyphotic  Communication    Communication: HOH(Has hearing aide but unsure if they work per [employer]. )  Cognition Arousal/Alertness: Awake/alert Behavior During Therapy: Anxious Overall Cognitive Status: Difficult to assess                                 General Comments: Pt very anxious with mobility and asking multiple times if he had his meds.       General Comments      Exercises     Assessment/Plan    PT Assessment Patient needs continued PT services  PT Problem List Decreased strength;Decreased balance;Decreased mobility;Decreased knowledge of use of DME;Decreased knowledge of precautions;Decreased safety awareness;Decreased cognition       PT Treatment Interventions DME instruction;Functional mobility training;Therapeutic activities;Therapeutic exercise;Balance training;Patient/family education;Gait training    PT Goals (Current goals can be found in the Care Plan section)  Acute Rehab PT Goals Patient Stated Goal: to go home PT Goal Formulation: With patient Time For Goal Achievement: 09/05/18 Potential to Achieve Goals: Good    Frequency Min 3X/week   Barriers to discharge        Co-evaluation               AM-PAC PT "6 Clicks" Mobility  Outcome Measure Help needed turning from your back to your side while in a flat bed without using bedrails?: A Little Help needed moving from lying on your back to sitting on the side of a flat bed without using bedrails?: A Little Help needed moving to and from a bed to a chair (including a wheelchair)?: Total Help needed standing up from a chair using your arms (e.g., wheelchair or bedside chair)?: A Lot Help needed to walk in hospital room?: Total Help needed climbing 3-5 steps with a railing? : Total 6 Click Score: 11    End of Session Equipment Utilized During Treatment: Gait belt Activity Tolerance: Patient tolerated treatment well Patient left: in bed;with call bell/phone within reach;with bed alarm set Nurse Communication:  Mobility status;Other (comment)(pt needs assist with feeding ) PT Visit Diagnosis: Unsteadiness on feet (R26.81);Muscle weakness (generalized) (M62.81);Other abnormalities of gait and mobility (R26.89);Difficulty in walking, not elsewhere classified (R26.2)    Time: 1037-1100 PT Time Calculation (min) (ACUTE ONLY): 23 min   Charges:   PT Evaluation $PT Eval Moderate Complexity: 1 Mod PT Treatments $Therapeutic Activity: 8-22 mins        Leighton Ruff, PT, DPT  Acute Rehabilitation Services  Pager: [phone] Office: [phone]   Rudean Hitt 08/22/2018, 11:16 AM

## 2018-08-22 NOTE — Care Management (Signed)
Notified by [facility] that patient was active prior to admission for RN PT OT [facility].

## 2018-08-22 NOTE — Progress Notes (Signed)
Patient ID: Evan [name], male   DOB: 08/26/1927, 85 y.o.   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  During recent hospitalization in CIR stay, patient was started on scheduled Xanax.  Palliative care discussions were recommended, however family declined per discharge summary.  In the ED, he was found to have urinalysis suggestive of UTI, chest x-ray showed trace bilateral pleural effusions with improved aeration at the left lung base and persistent opacity at the right lung base.  CT of the head showed interval evolution of left thalamic infarct without new or acute intracranial abnormality.  He was started on IV antibiotics.  Assessment & Plan:   Principal Problem:   Altered mental status Active Problems:   Hyperlipidemia   Hypertension   Anxiety   Atrial fibrillation (HCC)   Thalamic hemorrhage (HCC)   UTI (urinary tract infection)  Acute metabolic encephalopathy -Most likely from UTI.  CT of the head showed interval evaluation of left thalamic infarct without new or acute intracranial abnormality -Awake very minimally and hardly answers any questions.  Monitor mental status.  No signs of seizures. -Fall precautions.  If mental status does not improve with treatment of UTI, might consider MRI of the brain  Urinary tract  infection -Continue Rocephin.  Follow cultures  Recent left thalamic intracranial hemorrhage -CT head without contrast showed interval evaluation of the left thalamic infarct without new or intracranial abnormality.  Continue aspirin 81 mg per prior neurology recommendations.  Spoke with Dr. Arora/neurology on phone who reviewed the CAT head and recommended no further work-up and outpatient follow-up with neurology -No longer on anticoagulation for A. Fib -PT eval -Might need placement -SLP evaluation  Generalized deconditioning -Overall prognosis is very poor.  Will request palliative care evaluation  Paroxysmal A. fib -Rate controlled.  Continue metoprolol.  Hypertension  -Currently stable.  Continue Lopressor  Hyperlipidemia -Continue Crestor  Anxiety -Patient was on scheduled Ativan during recent admission.  Will hold Ativan for now.  DVT prophylaxis: SCDs Code Status: Full Family Communication: None at bedside.  Will try and contact family member on phone Disposition Plan: Depends on clinical outcome.  Might need nursing home placement depending on PT evaluation  Consultants: Palliative care  Procedures: None  Antimicrobials: Rocephin from 08/21/2018 onwards   Subjective: Patient seen and examined at bedside.  He wakes up only very slightly and is confused.  Very poor historian.  No overnight fever or vomiting.  Objective: Vitals:   08/21/18 2215 08/22/18 0029 08/22/18 0705 08/22/18 0956  BP: (!) 150/59 114/70 121/79   Pulse: 79 71 71 70  Resp: 14 16 16    Temp:  (!) 97.5 F (36.4 C) 97.8 F (36.6 C)   TempSrc:  Oral Oral   SpO2: 98% 100% 99%   Weight:  71.2 kg    Height:  5\' 5"  (1.651 m)      Intake/Output Summary (Last 24 hours) at 08/22/2018 1328 Last data  filed at 08/22/2018 1137 Gross per 24 hour  Intake 0 ml  Output --  Net 0 ml   Filed Weights   08/22/18 0029  Weight: 71.2 kg    Examination:  General exam: Looks very deconditioned and  chronically ill.  No distress.  Looks only minimally and does not answer most questions.   Respiratory system: Bilateral decreased breath sounds at bases with scattered crackles Cardiovascular system: S1 & S2 heard, Rate controlled Gastrointestinal system: Abdomen is nondistended, soft and nontender. Normal bowel sounds heard. Extremities: No cyanosis, clubbing, edema    Data Reviewed: I have personally reviewed following labs and imaging studies  CBC: Recent Labs  Lab 08/21/18 1530 08/22/18 0243  WBC 8.6 7.8  NEUTROABS 6.0  --   HGB 11.4* 10.8*  HCT 35.3* 33.0*  MCV 93.9 93.0  PLT 192 093   Basic Metabolic Panel: Recent Labs  Lab 08/21/18 1530 08/22/18 0243  NA 137 135  K 4.1 3.8  CL 103 103  CO2 22 20*  GLUCOSE 107* 104*  BUN 11 11  CREATININE 1.26* 1.20  CALCIUM 8.8* 8.8*   GFR: Estimated Creatinine Clearance: 37 mL/min (by C-G formula based on SCr of 1.2 mg/dL). Liver Function Tests: Recent Labs  Lab 08/21/18 1530  AST 19  ALT 14  ALKPHOS 120  BILITOT 0.6  PROT 6.4*  ALBUMIN 2.8*   No results for input(s): LIPASE, AMYLASE in the last 168 hours. No results for input(s): AMMONIA in the last 168 hours. Coagulation Profile: No results for input(s): INR, PROTIME in the last 168 hours. Cardiac Enzymes: Recent Labs  Lab 08/21/18 1530  TROPONINI <0.03   BNP (last 3 results) No results for input(s): PROBNP in the last 8760 hours. HbA1C: No results for input(s): HGBA1C in the last 72 hours. CBG: No results for input(s): GLUCAP in the last 168 hours. Lipid Profile: No results for input(s): CHOL, HDL, LDLCALC, TRIG, CHOLHDL, LDLDIRECT in the last 72 hours. Thyroid Function Tests: No results for input(s): TSH, T4TOTAL, FREET4, T3FREE, THYROIDAB in the last 72 hours. Anemia Panel: No results for input(s): VITAMINB12, FOLATE, FERRITIN, TIBC, IRON, RETICCTPCT in the last 72 hours. Sepsis Labs: No results for input(s): PROCALCITON, LATICACIDVEN in the last  168 hours.  Recent Results (from the past 240 hour(s))  SARS Coronavirus 2 (CEPHEID- Performed in [place] hospital lab), Hosp Order     Status: None   Collection Time: 08/21/18  4:57 PM  Result Value Ref Range Status   SARS Coronavirus 2 NEGATIVE NEGATIVE Final    Comment: (NOTE) If result is NEGATIVE SARS-CoV-2 target nucleic acids are NOT DETECTED. The SARS-CoV-2 RNA is generally detectable in upper and lower  respiratory specimens during the acute phase of infection. The lowest  concentration of SARS-CoV-2 viral copies this assay can detect is 250  copies / mL. A negative result does not preclude SARS-CoV-2 infection  and should not be used as the sole basis for treatment or other  patient management decisions.  A negative result may occur with  improper specimen collection / handling, submission of specimen other  than nasopharyngeal swab, presence of viral mutation(s) within the  areas targeted by this assay, and inadequate number of viral copies  (<250 copies / mL). A negative result must be combined with clinical  observations, patient history, and epidemiological information. If result is POSITIVE SARS-CoV-2 target nucleic acids are DETECTED. The SARS-CoV-2 RNA is generally detectable in upper and lower  respiratory specimens dur ing the acute phase  of infection.  Positive  results are indicative of active infection with SARS-CoV-2.  Clinical  correlation with patient history and other diagnostic information is  necessary to determine patient infection status.  Positive results do  not rule out bacterial infection or co-infection with other viruses. If result is PRESUMPTIVE POSTIVE SARS-CoV-2 nucleic acids MAY BE PRESENT.   A presumptive positive result was obtained on the submitted specimen  and confirmed on repeat testing.  While 2019 novel coronavirus  (SARS-CoV-2) nucleic acids may be present in the submitted sample  additional confirmatory testing may be necessary  for epidemiological  and / or clinical management purposes  to differentiate between  SARS-CoV-2 and other Sarbecovirus currently known to infect humans.  If clinically indicated additional testing with an alternate test  methodology [id number]) is advised. The SARS-CoV-2 RNA is generally  detectable in upper and lower respiratory sp ecimens during the acute  phase of infection. The expected result is Negative. Fact Sheet for Patients:  StrictlyIdeas.no Fact Sheet for Healthcare Providers: BankingDealers.co.za This test is not yet approved or cleared by the [place] FDA and has been authorized for detection and/or diagnosis of SARS-CoV-2 by FDA under an Emergency Use Authorization (EUA).  This EUA will remain in effect (meaning this test can be used) for the duration of the COVID-19 declaration under Section 564(b)(1) of the Act, 21 U.S.C. section 360bbb-3(b)(1), unless the authorization is terminated or revoked sooner. Performed at [facility], [place] [address], [place], [place] [address]          Radiology Studies: Ct Head Wo Contrast  Result Date: 08/21/2018 CLINICAL DATA:  Recent fall. EXAM: CT HEAD WITHOUT CONTRAST TECHNIQUE: Contiguous axial images were obtained from the base of the skull through the vertex without intravenous contrast. COMPARISON:  07/30/2018 FINDINGS: Brain: Interval evolution of left thalamic hemorrhagic infarct. Hemorrhagic component has resolved in the interval. No hydrocephalus or midline shift. No abnormal extra-axial fluid collection. No CT evidence for acute ischemia. Diffuse loss of parenchymal volume is consistent with atrophy. Patchy low attenuation in the deep hemispheric and periventricular white matter is nonspecific, but likely reflects chronic microvascular ischemic demyelination. Lacunar infarct noted right cerebellum. Vascular: No hyperdense vessel or unexpected calcification. Skull: No  evidence for fracture. No worrisome lytic or sclerotic lesion. Sinuses/Orbits: Chronic left maxillary sinusitis. Surgical changes right mastoid air cells. Visualized portions of the globes and intraorbital fat are unremarkable. Other: None. IMPRESSION: 1. Interval evolution of the left thalamic infarct. No new or acute intracranial abnormality on today's study. 2. Atrophy with chronic small vessel white matter ischemic disease. Electronically Signed   By: Misty Stanley M.D.   On: 08/21/2018 18:21   Dg Chest Port 1 View  Result Date: 08/21/2018 CLINICAL DATA:  Shortness of breath.  Pneumonia. EXAM: PORTABLE CHEST 1 VIEW COMPARISON:  08/02/2018 FINDINGS: The cardiac silhouette is enlarged. The patient is status post prior median sternotomy. There are small bilateral pleural effusions. There is a right lower lobe airspace opacity. The previously noted left lower lobe airspace opacity has improved. There is no pneumothorax. IMPRESSION: 1. Cardiomegaly. The patient is status post prior median sternotomy. 2. Trace to small bilateral pleural effusions. 3. Improved aeration at the left lung base. Persistent opacity at the right lung base favored to represent atelectasis with infiltrate not entirely excluded. Electronically Signed   By: Constance Holster M.D.   On: 08/21/2018 16:16        Scheduled Meds:  aspirin EC  81 mg Oral Daily  Lifitegrast  1-2 drop Both Eyes Daily   metoprolol tartrate  12.5 mg Oral BID   pantoprazole  40 mg Oral QODAY   rosuvastatin  5 mg Oral q1800   tamsulosin  0.4 mg Oral Daily   Continuous Infusions:  cefTRIAXone (ROCEPHIN)  IV       LOS: 0 days        Aline August, MD [facility] 08/22/2018, 1:28 PM

## 2018-08-23 ENCOUNTER — Inpatient Hospital Stay (HOSPITAL_COMMUNITY): Payer: Medicare HMO

## 2018-08-23 ENCOUNTER — Encounter: Payer: Medicare HMO | Admitting: Physical Medicine & Rehabilitation

## 2018-08-23 ENCOUNTER — Inpatient Hospital Stay: Payer: Self-pay | Admitting: Family Medicine

## 2018-08-23 LAB — COMPREHENSIVE METABOLIC PANEL
ALT: 12 U/L (ref 0–44)
AST: 16 U/L (ref 15–41)
Albumin: 2.5 g/dL — ABNORMAL LOW (ref 3.5–5.0)
Alkaline Phosphatase: 117 U/L (ref 38–126)
Anion gap: 9 (ref 5–15)
BUN: 14 mg/dL (ref 8–23)
CO2: 20 mmol/L — ABNORMAL LOW (ref 22–32)
Calcium: 8.7 mg/dL — ABNORMAL LOW (ref 8.9–10.3)
Chloride: 107 mmol/L (ref 98–111)
Creatinine, Ser: 1.19 mg/dL (ref 0.61–1.24)
GFR calc Af Amer: >60 mL/min (ref >60)
GFR calc non Af Amer: 54 mL/min — ABNORMAL LOW (ref >60)
Glucose, Bld: 108 mg/dL — ABNORMAL HIGH (ref 70–99)
Potassium: 3.9 mmol/L (ref 3.5–5.1)
Sodium: 136 mmol/L (ref 135–145)
Total Bilirubin: 0.4 mg/dL (ref 0.3–1.2)
Total Protein: 5.5 g/dL — ABNORMAL LOW (ref 6.5–8.1)

## 2018-08-23 LAB — MAGNESIUM: Magnesium: 1.8 mg/dL (ref 1.7–2.4)

## 2018-08-23 LAB — TSH: TSH: 1.378 u[IU]/mL (ref 0.350–4.500)

## 2018-08-23 LAB — CBC WITH DIFFERENTIAL/PLATELET
Abs Immature Granulocytes: 0.06 10*3/uL (ref 0.00–0.07)
Basophils Absolute: 0 10*3/uL (ref 0.0–0.1)
Basophils Relative: 0 %
Eosinophils Absolute: 0.1 10*3/uL (ref 0.0–0.5)
Eosinophils Relative: 1 %
HCT: 32.9 % — ABNORMAL LOW (ref 39.0–52.0)
Hemoglobin: 10.8 g/dL — ABNORMAL LOW (ref 13.0–17.0)
Immature Granulocytes: 1 %
Lymphocytes Relative: 21 %
Lymphs Abs: 1.6 10*3/uL (ref 0.7–4.0)
MCH: 30.3 pg (ref 26.0–34.0)
MCHC: 32.8 g/dL (ref 30.0–36.0)
MCV: 92.4 fL (ref 80.0–100.0)
Monocytes Absolute: 0.9 10*3/uL (ref 0.1–1.0)
Monocytes Relative: 12 %
Neutro Abs: 4.8 10*3/uL (ref 1.7–7.7)
Neutrophils Relative %: 65 %
Platelets: 154 10*3/uL (ref 150–400)
RBC: 3.56 MIL/uL — ABNORMAL LOW (ref 4.22–5.81)
RDW: 13.6 % (ref 11.5–15.5)
WBC: 7.5 10*3/uL (ref 4.0–10.5)
nRBC: 0 % (ref 0.0–0.2)

## 2018-08-23 LAB — AMMONIA: Ammonia: 15 umol/L (ref 9–35)

## 2018-08-23 LAB — FOLATE: Folate: 9.1 ng/mL (ref >5.9)

## 2018-08-23 LAB — VITAMIN B12: Vitamin B-12: 509 pg/mL (ref 180–914)

## 2018-08-23 MED ORDER — SODIUM CHLORIDE 0.9 % IV SOLN
INTRAVENOUS | Status: DC | PRN
  Administered 2018-08-23: 500 mL via INTRAVENOUS

## 2018-08-23 NOTE — Progress Notes (Signed)
08/23/18 1549  PT Visit Information  Last PT Received On 08/23/18  Assistance Needed +2  History of Present Illness Pt is an 85 y/o male admitted secondary to AMS and SOB. AMS likely secondary to UTI. CT revealed evolution of L thalamic infarct. PMH includes MI, CAD, A fib, HTN, CVA with R sided deficits, PE, and R lung adenocarcinoma.   Subjective Data  Patient Stated Goal to go home hopefully tomorrow  Precautions  Precautions Fall  Restrictions  Weight Bearing Restrictions No  Pain Assessment  Pain Assessment Faces  Faces Pain Scale 2  Pain Location generalized  Pain Descriptors / Indicators Grimacing  Pain Intervention(s) Limited activity within patient's tolerance;Monitored during session;Repositioned  Cognition  Arousal/Alertness Awake/alert  Behavior During Therapy [facility] for tasks assessed/performed  Overall Cognitive Status Difficult to assess  General Comments Pt with less anxiety with movement this session.   Difficult to assess due to Hard of hearing/deaf  Bed Mobility  Overal bed mobility Needs Assistance  Bed Mobility Supine to Sit;Sit to Supine  Supine to sit Min assist  Sit to supine Min assist  General bed mobility comments Min A for trunk elevation and to assist with scooting hips to EOB. Min A For LE assist to return to supine  Transfers  Overall transfer level Needs assistance  Equipment used 1 person hand held assist  Transfers Sit to/from Stand  Sit to Stand Mod assist  General transfer comment Mod A for lift assist and steadying to stand. Blocked RLE during standing. Performed sit<>Stand X 2 this session.   Ambulation/Gait  Ambulation/Gait assistance Mod assist;Max assist  Assistive device 1 person hand held assist  General Gait Details Pt taking side steps at EOB this session with mod to max A. Pt holding onto bed rail with LUE for support. Improved ability to take steps, however, RLE with ataxia when taking steps.   Balance  Overall balance assessment  Needs assistance  Sitting-balance support No upper extremity supported;Feet supported  Sitting balance-[name] Scale Fair  Standing balance support During functional activity;Single extremity supported  Standing balance-[name] Scale Poor  Standing balance comment Reliant on LUE and external support to stand.   General Comments  General comments (skin integrity, edema, etc.) Oxygen sats at 99% on 2L following mobility tasks.   Exercises  Exercises General Lower Extremity  General Exercises - Lower Extremity  Ankle Circles/Pumps AROM;Both;20 reps;Supine  Long Arc Quad AROM;Both;10 reps;Supine (ataxic movements in RLE )  PT - End of Session  Activity Tolerance Patient tolerated treatment well  Patient left in bed;with call bell/phone within reach;with bed alarm set  Nurse Communication Mobility status   PT - Assessment/Plan  PT Plan Current plan remains appropriate  PT Visit Diagnosis Unsteadiness on feet (R26.81);Muscle weakness (generalized) (M62.81);Other abnormalities of gait and mobility (R26.89);Difficulty in walking, not elsewhere classified (R26.2)  PT Frequency (ACUTE ONLY) Min 3X/week  Follow Up Recommendations SNF;Supervision/Assistance - 24 hour (if close to baseline, can go home with HHPT and 24/7)  PT equipment None recommended by PT  AM-PAC PT "6 Clicks" Mobility Outcome Measure (Version 2)  Help needed turning from your back to your side while in a flat bed without using bedrails? 3  Help needed moving from lying on your back to sitting on the side of a flat bed without using bedrails? 3  Help needed moving to and from a bed to a chair (including a wheelchair)? 2  Help needed standing up from a chair using your arms (e.g., wheelchair or  bedside chair)? 2  Help needed to walk in hospital room? 1  Help needed climbing 3-5 steps with a railing?  1  6 Click Score 12  Consider Recommendation of Discharge To: CIR/SNF/LTACH  PT Goal Progression  Progress towards PT goals  Progressing toward goals  Acute Rehab PT Goals  PT Goal Formulation With patient  Time For Goal Achievement 09/05/18  Potential to Achieve Goals Good  PT Time Calculation  PT Start Time (ACUTE ONLY) 1436  PT Stop Time (ACUTE ONLY) 1450  PT Time Calculation (min) (ACUTE ONLY) 14 min  PT General Charges  $$ ACUTE PT VISIT 1 Visit  PT Treatments  $Therapeutic Activity 8-22 mins   Pt progressing towards goals. Able to take side steps at EOB with mod to max A this session. Steps with RLE remain very uncoordinated. Performed supine HEP as well. Oxygen sats at 99% on 2L of oxygen. Per notes, pt plans to return home with 24/7 assist from family and resumption of [place] services. If family unable to provide necessary assist, feel pt would benefit from SNF level therapies. Will continue to follow acutely to maximize functional mobility independence and safety.   Leighton Ruff, PT, DPT  Acute Rehabilitation Services  Pager: [phone] Office: [phone]

## 2018-08-23 NOTE — Progress Notes (Signed)
Modified Barium Swallow Progress Note  Patient Details  Name: Evan [name] MRN: [id number] Date of Birth: 30-Jul-1927  Today's Date: 08/23/2018  Modified Barium Swallow completed.  Full report located under Chart Review in the Imaging Section.  Brief recommendations include the following:  Clinical Impression  Pt's oral phase overall functional except for min-mild right buccal cavity residue identified at end of MBS during oral care. Pharyngeal phase, pt's demonstrates delayed closure of laryngeal vestibule/trachea with resultant silent aspiration with thin. Once laryngeal elevation and anterior hyoid movement is achieved larynx is protected however barium fills pyriform sinuses before elevation of larynx is initiated. Aspiration also occured with ching tuck position. He did not have residue post swallow and nectar thick slowed transit allowing for adequate airway protection. Negative observations when esophagus briefly scanned. Recommend continue Dys 2, nectar thick, crush pills and full assist with meals.       Swallow Evaluation Recommendations       SLP Diet Recommendations: Dysphagia 2 (Fine chop) solids;Nectar thick liquid   Liquid Administration via: Cup;No straw   Medication Administration: Crushed with puree   Supervision: Full supervision/cueing for compensatory strategies;Full assist for feeding   Compensations: Minimize environmental distractions;Small sips/bites;Slow rate;Lingual sweep for clearance of pocketing   Postural Changes: Seated upright at 90 degrees   Oral Care Recommendations: Oral care BID        Houston Siren 08/23/2018,10:15 AM   Orbie Pyo Colvin Caroli.Ed Risk analyst [phone] Office [phone]

## 2018-08-23 NOTE — Evaluation (Signed)
Occupational Therapy Evaluation Patient Details Name: Evan [name] MRN: [id number] DOB: [birth year] Today's Date: 08/23/2018    History of Present Illness Pt is an 85 y/o male admitted secondary to AMS and SOB. AMS likely secondary to UTI. CT revealed evolution of L thalamic infarct. PMH includes MI, CAD, A fib, HTN, CVA with R sided deficits, PE, and R lung adenocarcinoma.    Clinical Impression   Pt PTA: living with family. Pt currently performing bed mobility with minA; transfers with maxA and no steps taken. Pt pivoting to recliner and then wanting to go back to bed. SpO2 levels >90% on 3L O2 with activity . Pt requiring maxA to [name] for ADL and maxA to [name] for ADL functional transfers. Pt anxious stating "I can't breathe throughout session with no signs of SOB. VSS. Pt would benefit from continued OT skilled services for ADL, mobility and safety in SNF setting. OT to follow acutely.      Follow Up Recommendations  SNF;Supervision/Assistance - 24 hour(if pt close to baseline, hh with 24/7)    Equipment Recommendations  Other (comment)(to be determined)    Recommendations for Other Services       Precautions / Restrictions Precautions Precautions: Fall Restrictions Weight Bearing Restrictions: No      Mobility Bed Mobility Overal bed mobility: Needs Assistance Bed Mobility: Supine to Sit;Sit to Supine     Supine to sit: Min assist Sit to supine: Min assist   General bed mobility comments: Min A for trunk elevation to come to sitting. Min A for LE assist for return to supine.   Transfers Overall transfer level: Needs assistance Equipment used: Rolling walker (2 wheeled);1 person hand held assist Transfers: Sit to/from Stand Sit to Stand: Mod assist         General transfer comment: requires assist. RLE with some movement for pivot, but no steps taken    Balance Overall balance assessment: Needs assistance Sitting-balance support: No upper extremity  supported;Feet supported Sitting balance-[name] Scale: Fair     Standing balance support: During functional activity;Single extremity supported Standing balance-[name] Scale: Poor Standing balance comment: Reliant on LUE and external support to stand.                            ADL either performed or assessed with clinical judgement   ADL Overall ADL's : Needs assistance/impaired Eating/Feeding: Maximal assistance;Bed level   Grooming: Maximal assistance;Bed level   Upper Body Bathing: Maximal assistance;Bed level   Lower Body Bathing: Total assistance;Sitting/lateral leans;Sit to/from stand   Upper Body Dressing : Maximal assistance;Sitting;Standing   Lower Body Dressing: Total assistance;Sitting/lateral leans;Sit to/from stand   Toilet Transfer: Maximal assistance;BSC   Toileting- Clothing Manipulation and Hygiene: Total assistance;Sit to/from stand       Functional mobility during ADLs: Maximal assistance(for stand pivot transfers) General ADL Comments: Pt requires increased assist for ADL and cosntantly shouting "help me, i can't breathe." O2 sats >90% on 3L O2.     Vision Baseline Vision/History: Wears glasses Wears Glasses: At all times Patient Visual Report: No change from baseline Vision Assessment?: Vision impaired- to be further tested in functional context     Perception     Praxis      Pertinent Vitals/Pain Pain Assessment: Faces Faces Pain Scale: Hurts a little bit Pain Intervention(s): Monitored during session     Hand Dominance Right   Extremity/Trunk Assessment Upper Extremity Assessment Upper Extremity Assessment: Generalized weakness;RUE deficits/detail RUE Deficits /  Details: poor strength 2/5 MM grade and R digit flexion into palm RUE: Unable to fully assess due to immobilization RUE Coordination: decreased fine motor;decreased gross motor   Lower Extremity Assessment Lower Extremity Assessment: Defer to PT evaluation    Cervical / Trunk Assessment Cervical / Trunk Assessment: Kyphotic   Communication Communication Communication: HOH   Cognition Arousal/Alertness: Awake/alert Behavior During Therapy: Anxious Overall Cognitive Status: Difficult to assess                                 General Comments: Pt very anxious with mobility and asking multiple times if he had his meds.    General Comments  Pt very anxious today and difficult to calm down. NT had to repalce catheter and pt seemed to calm down after repositioning in bed.    Exercises     Shoulder Instructions      Home Living Family/patient expects to be discharged to:: Private residence Living Arrangements: Children Available Help at Discharge: Family;Available 24 hours/day Type of Home: House Home Access: Ramped entrance     Home Layout: Two level;Laundry or work area in basement;Able to live on main level with bedroom/bathroom     Bathroom Shower/Tub: Occupational psychologist: Handicapped height     Home Equipment: Environmental consultant - 2 wheels;Shower seat;Cane - single point;Wheelchair - manual   Additional Comments:  Per chart review: daughter confirms home set up . one level with laundry in the basement  Lives With: Daughter;Family    Prior Functioning/Environment Level of Independence: Needs assistance  Gait / Transfers Assistance Needed: Pt required assist for transfers to/from [facility]. Pt also required assist with WC mobility.  ADL's / Homemaking Assistance Needed: Pt required assist with ADL tasks.             OT Problem List: Decreased strength;Decreased activity tolerance;Impaired balance (sitting and/or standing);Decreased safety awareness;Pain;Impaired UE functional use      OT Treatment/Interventions: Self-care/ADL training;Therapeutic exercise;Neuromuscular education;Energy conservation;Therapeutic activities;Patient/family education;Balance training    OT Goals(Current goals can be found in the  care plan section) Acute Rehab OT Goals Patient Stated Goal: to go home OT Goal Formulation: With patient Time For Goal Achievement: 09/06/18 Potential to Achieve Goals: Good ADL Goals Pt Will Perform Grooming: with modified independence;sitting Pt Will Perform Tub/Shower Transfer: with min assist;Stand pivot transfer;3 in 1 Additional ADL Goal #1: Pt will perform stand pivot/squat pivot functional transfers with minA overall and fair balance.  OT Frequency: Min 3X/week   Barriers to D/C:            Co-evaluation              AM-PAC OT "6 Clicks" Daily Activity     Outcome Measure Help from another person eating meals?: A Lot Help from another person taking care of personal grooming?: A Lot Help from another person toileting, which includes using toliet, bedpan, or urinal?: Total Help from another person bathing (including washing, rinsing, drying)?: Total Help from another person to put on and taking off regular upper body clothing?: Total Help from another person to put on and taking off regular lower body clothing?: Total 6 Click Score: 8   End of Session Equipment Utilized During Treatment: Gait belt Nurse Communication: Mobility status  Activity Tolerance: Patient tolerated treatment well;Treatment limited secondary to medical complications (Comment) Patient left: in bed;with call bell/phone within reach;with bed alarm set  OT Visit Diagnosis: Unsteadiness on  feet (R26.81);Muscle weakness (generalized) (M62.81)                Time: 4436-0165 OT Time Calculation (min): 31 min Charges:  OT General Charges $OT Visit: 1 Visit OT Evaluation $OT Eval Moderate Complexity: 1 Mod OT Treatments $Self Care/Home Management : 8-22 mins  Ebony Hail Harold Hedge) Marsa Aris OTR/L Acute Rehabilitation Services Pager: [phone] Office: [place] 08/23/2018, 2:30 PM

## 2018-08-23 NOTE — Progress Notes (Signed)
Patient ID: Evan [name], male   DOB: [birth year], 85 y.o.   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  During recent hospitalization in CIR stay, patient was started on scheduled Xanax.  Palliative care discussions were recommended, however family declined per discharge summary.  In the ED, he was found to have urinalysis suggestive of UTI, chest x-ray showed trace bilateral pleural effusions with improved aeration at the left lung base and persistent opacity at the right lung base.  CT of the head showed interval evolution of left thalamic infarct without new or acute intracranial abnormality.  He was started on IV antibiotics.  Assessment & Plan:   Principal Problem:   Altered mental status Active Problems:   Hyperlipidemia   Hypertension   Anxiety   Atrial fibrillation (HCC)   Thalamic hemorrhage (HCC)   UTI (urinary tract infection)   AMS (altered mental status)  Acute metabolic encephalopathy -Most likely from UTI.  CT of the head showed interval evolution of left thalamic infarct without new or acute intracranial abnormality -Left hip slightly but hardly answers any questions.  Monitor mental status.  No signs of seizures. -Fall precautions.  If mental status does not improve with treatment of UTI, might consider MRI of the  brain.  Will hold off on MRI for now.  Urinary tract infection -Continue Rocephin.  Urine culture is growing E. coli.  Follow sensitivities.  Recent left thalamic intracranial hemorrhage -CT head without contrast showed interval evaluation of the left thalamic infarct without new or intracranial abnormality.  Continue aspirin 81 mg per prior neurology recommendations.  Spoke with Dr. Arora/neurology on phone on 08/22/2018 who reviewed the CAT head and recommended no further work-up and outpatient follow-up with neurology -No longer on anticoagulation for A. Fib -PT recommends SNF placement.  Social worker consult. -Diet as per SLP recommendations.  Generalized deconditioning -Overall prognosis is very poor.  Will request palliative care evaluation  Paroxysmal A. fib -Rate controlled.  Continue metoprolol.  Hypertension  -Currently stable.  Continue Lopressor  Hyperlipidemia -Continue Crestor  Anxiety -Patient was on scheduled Ativan during recent admission.  Will hold Ativan for now.  DVT prophylaxis: SCDs Code Status: Full Family Communication: Spoke to daughter [name] on phone on 08/22/2018 disposition Plan: Might need nursing home placement if family agreeable. Consultants: Palliative care  Procedures: None  Antimicrobials: Rocephin from 08/21/2018 onwards   Subjective: Patient seen and examined at bedside.  He is sleepy, wakes up slightly on calling his name.  Confused.  No overnight fever or vomiting reported by nursing staff.  Oral intake is still very poor.  No overnight agitation reported. Objective: Vitals:   08/22/18 1337 08/22/18 2155 08/23/18 0403 08/23/18 0953  BP: 113/60 136/82 122/70 116/62  Pulse: 70 75 66 68  Resp: 18 18 18    Temp: 98.1 F (36.7 C) (!) 97.5 F (36.4 C) 97.6 F (36.4 C)   TempSrc: Oral Oral Oral  SpO2: 98% 98% 100% 100%  Weight:   72.4 kg   Height:        Intake/Output Summary (Last 24 hours) at 08/23/2018 1045 Last data  filed at 08/23/2018 1014 Gross per 24 hour  Intake 580 ml  Output 500 ml  Net 80 ml   Filed Weights   08/22/18 0029 08/23/18 0403  Weight: 71.2 kg 72.4 kg    Examination:  General exam: Looks very deconditioned and chronically ill.  No acute distress.  Wakes up slightly on calling his name, answers only minimal questions.  Confused. Respiratory system: Bilateral decreased breath sounds at bases with scattered crackles.  No wheezing Cardiovascular system: S1 & S2 heard, Rate controlled Gastrointestinal system: Abdomen is nondistended, soft and nontender. Normal bowel sounds heard. Extremities: No cyanosis, edema    Data Reviewed: I have personally reviewed following labs and imaging studies  CBC: Recent Labs  Lab 08/21/18 1530 08/22/18 0243 08/23/18 0236  WBC 8.6 7.8 7.5  NEUTROABS 6.0  --  4.8  HGB 11.4* 10.8* 10.8*  HCT 35.3* 33.0* 32.9*  MCV 93.9 93.0 92.4  PLT 192 171 401   Basic Metabolic Panel: Recent Labs  Lab 08/21/18 1530 08/22/18 0243 08/23/18 0236  NA 137 135 136  K 4.1 3.8 3.9  CL 103 103 107  CO2 22 20* 20*  GLUCOSE 107* 104* 108*  BUN 11 11 14   CREATININE 1.26* 1.20 1.19  CALCIUM 8.8* 8.8* 8.7*  MG  --   --  1.8   GFR: Estimated Creatinine Clearance: 37.3 mL/min (by C-G formula based on SCr of 1.19 mg/dL). Liver Function Tests: Recent Labs  Lab 08/21/18 1530 08/23/18 0236  AST 19 16  ALT 14 12  ALKPHOS 120 117  BILITOT 0.6 0.4  PROT 6.4* 5.5*  ALBUMIN 2.8* 2.5*   No results for input(s): LIPASE, AMYLASE in the last 168 hours. Recent Labs  Lab 08/23/18 0236  AMMONIA 15   Coagulation Profile: No results for input(s): INR, PROTIME in the last 168 hours. Cardiac Enzymes: Recent Labs  Lab 08/21/18 1530  TROPONINI <0.03   BNP (last 3 results) No results for input(s): PROBNP in the last 8760 hours. HbA1C: No results for input(s): HGBA1C in the last 72 hours. CBG: No results for input(s): GLUCAP in the last 168 hours. Lipid  Profile: No results for input(s): CHOL, HDL, LDLCALC, TRIG, CHOLHDL, LDLDIRECT in the last 72 hours. Thyroid Function Tests: Recent Labs    08/23/18 0236  TSH 1.378   Anemia Panel: Recent Labs    08/23/18 0236  VITAMINB12 509  FOLATE 9.1   Sepsis Labs: No results for input(s): PROCALCITON, LATICACIDVEN in the last 168 hours.  Recent Results (from the past 240 hour(s))  SARS Coronavirus 2 (CEPHEID- Performed in [place] hospital lab), Hosp Order     Status: None   Collection Time: 08/21/18  4:57 PM  Result Value Ref Range Status   SARS Coronavirus 2 NEGATIVE NEGATIVE Final    Comment: (NOTE) If result is NEGATIVE SARS-CoV-2 target nucleic acids are NOT DETECTED. The SARS-CoV-2 RNA is generally detectable in upper and lower  respiratory specimens during the acute phase of infection. The lowest  concentration of SARS-CoV-2 viral copies this assay can detect is 250  copies / mL. A negative result does not preclude SARS-CoV-2 infection  and should not be used as the sole basis for treatment or other  patient management decisions.  A negative result may occur with  improper specimen collection /  handling, submission of specimen other  than nasopharyngeal swab, presence of viral mutation(s) within the  areas targeted by this assay, and inadequate number of viral copies  (<250 copies / mL). A negative result must be combined with clinical  observations, patient history, and epidemiological information. If result is POSITIVE SARS-CoV-2 target nucleic acids are DETECTED. The SARS-CoV-2 RNA is generally detectable in upper and lower  respiratory specimens dur ing the acute phase of infection.  Positive  results are indicative of active infection with SARS-CoV-2.  Clinical  correlation with patient history and other diagnostic information is  necessary to determine patient infection status.  Positive results do  not rule out bacterial infection or co-infection with other  viruses. If result is PRESUMPTIVE POSTIVE SARS-CoV-2 nucleic acids MAY BE PRESENT.   A presumptive positive result was obtained on the submitted specimen  and confirmed on repeat testing.  While 2019 novel coronavirus  (SARS-CoV-2) nucleic acids may be present in the submitted sample  additional confirmatory testing may be necessary for epidemiological  and / or clinical management purposes  to differentiate between  SARS-CoV-2 and other Sarbecovirus currently known to infect humans.  If clinically indicated additional testing with an alternate test  methodology [phone]) is advised. The SARS-CoV-2 RNA is generally  detectable in upper and lower respiratory sp ecimens during the acute  phase of infection. The expected result is Negative. Fact Sheet for Patients:  StrictlyIdeas.no Fact Sheet for Healthcare Providers: BankingDealers.co.za This test is not yet approved or cleared by the [place] FDA and has been authorized for detection and/or diagnosis of SARS-CoV-2 by FDA under an Emergency Use Authorization (EUA).  This EUA will remain in effect (meaning this test can be used) for the duration of the COVID-19 declaration under Section 564(b)(1) of the Act, 21 U.S.C. section 360bbb-3(b)(1), unless the authorization is terminated or revoked sooner. Performed at [facility], [place] [address] [place], [place] [address]   Urine culture     Status: Abnormal (Preliminary result)   Collection Time: 08/21/18  7:34 PM  Result Value Ref Range Status   Specimen Description URINE, RANDOM  Final   Special Requests NONE  Final   Culture (A)  Final    >=100,000 COLONIES/mL ESCHERICHIA COLI SUSCEPTIBILITIES TO FOLLOW Performed at [facility] [address], [place], [place] [address]    Report Status PENDING  Incomplete         Radiology Studies: Dg Chest 2 View  Result Date: 08/23/2018 CLINICAL DATA:  Shortness of breath  and weakness EXAM: CHEST - 2 VIEW COMPARISON:  08/21/2018 FINDINGS: Cardiac shadows within normal limits. Aortic calcifications are noted. Postsurgical changes are again seen. The lungs are well aerated with evidence of mild atelectatic changes in the right base and right upper lobe along the minor fissure. Mild left basilar atelectasis is noted. No bony abnormality is seen. IMPRESSION: Mild bibasilar atelectasis as well as right upper lobe atelectatic changes. Follow-up imaging is recommended. Electronically Signed   By: Inez Catalina M.D.   On: 08/23/2018 09:07   Ct Head Wo Contrast  Result Date: 08/21/2018 CLINICAL DATA:  Recent fall. EXAM: CT HEAD WITHOUT CONTRAST TECHNIQUE: Contiguous axial images were obtained from the base of the skull through the vertex without intravenous contrast. COMPARISON:  07/30/2018 FINDINGS: Brain: Interval evolution of left thalamic hemorrhagic infarct. Hemorrhagic component has resolved in the interval. No hydrocephalus or midline shift. No abnormal extra-axial fluid collection. No CT evidence for acute ischemia. Diffuse loss  of parenchymal volume is consistent with atrophy. Patchy low attenuation in the deep hemispheric and periventricular white matter is nonspecific, but likely reflects chronic microvascular ischemic demyelination. Lacunar infarct noted right cerebellum. Vascular: No hyperdense vessel or unexpected calcification. Skull: No evidence for fracture. No worrisome lytic or sclerotic lesion. Sinuses/Orbits: Chronic left maxillary sinusitis. Surgical changes right mastoid air cells. Visualized portions of the globes and intraorbital fat are unremarkable. Other: None. IMPRESSION: 1. Interval evolution of the left thalamic infarct. No new or acute intracranial abnormality on today's study. 2. Atrophy with chronic small vessel white matter ischemic disease. Electronically Signed   By: Misty Stanley M.D.   On: 08/21/2018 18:21   Dg Chest Port 1 View  Result Date:  08/21/2018 CLINICAL DATA:  Shortness of breath.  Pneumonia. EXAM: PORTABLE CHEST 1 VIEW COMPARISON:  08/02/2018 FINDINGS: The cardiac silhouette is enlarged. The patient is status post prior median sternotomy. There are small bilateral pleural effusions. There is a right lower lobe airspace opacity. The previously noted left lower lobe airspace opacity has improved. There is no pneumothorax. IMPRESSION: 1. Cardiomegaly. The patient is status post prior median sternotomy. 2. Trace to small bilateral pleural effusions. 3. Improved aeration at the left lung base. Persistent opacity at the right lung base favored to represent atelectasis with infiltrate not entirely excluded. Electronically Signed   By: Constance Holster M.D.   On: 08/21/2018 16:16   Dg Swallowing Func-speech Pathology  Result Date: 08/23/2018 Objective Swallowing Evaluation: Type of Study: MBS-Modified Barium Swallow Study  Patient Details Name: KAYLIN SCHELLENBERG MRN: [id number] Date of Birth: September 25, 1927 Today's Date: 08/23/2018 Time: SLP Start Time (ACUTE ONLY): 102 -SLP Stop Time (ACUTE ONLY): 0756 SLP Time Calculation (min) (ACUTE ONLY): 16 min Past Medical History: Past Medical History: Diagnosis Date  Adenocarcinoma of right lung ([place]) 01/06/2016  Anginal chest pain at rest [facility])   Chronic non, controlled on Lorazepam  Anxiety   Arthritis   "all over"   BPH (benign prostatic hyperplasia)   CAD (coronary artery disease)   Chronic lower back pain   Complication of anesthesia   "problems making water afterwards"  Depression   DJD (degenerative joint disease)   Esophageal ulcer without bleeding   bid ppi indefinitely  Family history of adverse reaction to anesthesia   "children w/PONV"  GERD (gastroesophageal reflux disease)   Headache   "couple/week maybe" (09/04/2015)  Hemochromatosis   Possible, elevated iron stores, hook-like osteophytes on hand films, normal LFTs  Hepatitis   "yellow jaundice as a baby"  History of ASCVD   MULTIVESSEL   Hyperlipidemia   Hypertension   Myocardial infarction ([place]) 1999  Osteoarthritis   Pneumonia   "several times; got a little now" (09/04/2015)  Rheumatoid arthritis ([place])   Subdural hematoma ([place])   small after fall 09/2016-plavix held, neurosurgery consulted.  Asymptomatic.    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 since he failed PO Eliquis Past Surgical History: Past Surgical History: Procedure Laterality Date  ARM SKIN LESION BIOPSY / EXCISION Left 09/02/2015  CATARACT EXTRACTION W/ INTRAOCULAR LENS  IMPLANT, BILATERAL Bilateral   CHOLECYSTECTOMY OPEN    CORNEAL TRANSPLANT Bilateral   "one at [facility]; one at [facility]"  [place] W/ STENT    DE stent ostium into the right radial free graft at OM-, 02-2007  ESOPHAGOGASTRODUODENOSCOPY N/A 11/09/2014  Procedure: ESOPHAGOGASTRODUODENOSCOPY (EGD);  Surgeon: [name], MD;  Location[facility]  ENDOSCOPY;  Service: Endoscopy;  Laterality: N/A;  INGUINAL HERNIA REPAIR    JOINT REPLACEMENT    LEFT HEART CATH AND CORS/GRAFTS ANGIOGRAPHY N/A 06/27/2017  Procedure: LEFT HEART CATH AND CORS/GRAFTS ANGIOGRAPHY;  Surgeon: [name], MD;  Location: [place] [facility] LAB;  Service: Cardiovascular;  Laterality: N/A;  NASAL SINUS SURGERY    SHOULDER OPEN ROTATOR CUFF REPAIR Right   TOTAL KNEE ARTHROPLASTY Bilateral  HPI: Pt is an 85 y/o male admitted secondary to AMS and SOB. AMS likely secondary to UTI. CT revealed evolution of L thalamic infarct. PMH includes MI, CAD, A fib, HTN, CVA with R sided deficits, PE, and R lung adenocarcinoma.  Patient was recently discharged home after an extended CIR stay (4/14-5/12) and was discharged home with family and [facility] SLP, on Dys 2, nectar thick liquids diet.  Subjective: pleasant, hard of hearing but has hearing aids (left one seems to work, right does not) Assessment / Plan / Recommendation CHL IP CLINICAL IMPRESSIONS  08/23/2018 Clinical Impression Pt's oral phase overall functional except for min-mild right buccal cavity residue identified at end of MBS during oral care. Pharyngeal phase, pt's demonstrates delayed closure of laryngeal vestibule/trachea with resultant silent aspiration with thin. Once laryngeal elevation and anterior hyoid movement is achieved larynx is protected however barium fills pyriform sinuses before elevation of larynx is initiated. Aspiration also occured with ching tuck position. He did not have residue post swallow and nectar thick slowed transit allowing for adequate airway protection. Negative observations when esophagus briefly scanned. Recommend continue Dys 2, nectar thick, crush pills and full assist with meals.     SLP Visit Diagnosis Dysphagia, pharyngeal phase (R13.13) Attention and concentration deficit following -- Frontal lobe and executive function deficit following -- Impact on safety and function Moderate aspiration risk;Severe aspiration risk   CHL IP TREATMENT RECOMMENDATION 08/23/2018 Treatment Recommendations Therapy as outlined in treatment plan below   Prognosis 08/23/2018 Prognosis for Safe Diet Advancement Fair Barriers to Reach Goals -- Barriers/Prognosis Comment -- CHL IP DIET RECOMMENDATION 08/23/2018 SLP Diet Recommendations Dysphagia 2 (Fine chop) solids;Nectar thick liquid Liquid Administration via Cup;No straw Medication Administration Crushed with puree Compensations Minimize environmental distractions;Small sips/bites;Slow rate;Lingual sweep for clearance of pocketing Postural Changes Seated upright at 90 degrees   CHL IP OTHER RECOMMENDATIONS 08/23/2018 Recommended Consults -- Oral Care Recommendations Oral care BID Other Recommendations --   CHL IP FOLLOW UP RECOMMENDATIONS 08/23/2018 Follow up Recommendations Skilled Nursing facility;Home health SLP   CHL IP FREQUENCY AND DURATION 08/23/2018 Speech Therapy Frequency (ACUTE ONLY) min 2x/week Treatment Duration 1 week      CHL  IP ORAL PHASE 08/23/2018 Oral Phase Impaired Oral - Pudding Teaspoon -- Oral - Pudding Cup -- Oral - Honey Teaspoon -- Oral - Honey Cup -- Oral - Nectar Teaspoon -- Oral - Nectar Cup -- Oral - Nectar Straw -- Oral - Thin Teaspoon -- Oral - Thin Cup -- Oral - Thin Straw -- Oral - Puree -- Oral - Mech Soft -- Oral - Regular Right pocketing in lateral sulci Oral - Multi-Consistency -- Oral - Pill -- Oral Phase - Comment --  CHL IP PHARYNGEAL PHASE 08/23/2018 Pharyngeal Phase Impaired Pharyngeal- Pudding Teaspoon -- Pharyngeal -- Pharyngeal- Pudding Cup -- Pharyngeal -- Pharyngeal- Honey Teaspoon -- Pharyngeal -- Pharyngeal- Honey Cup -- Pharyngeal -- Pharyngeal- Nectar Teaspoon -- Pharyngeal -- Pharyngeal- Nectar Cup WFL Pharyngeal -- Pharyngeal- Nectar Straw -- Pharyngeal -- Pharyngeal- Thin Teaspoon -- Pharyngeal -- Pharyngeal- Thin Cup Penetration/Aspiration during swallow Pharyngeal Material  enters airway, passes BELOW cords without attempt by patient to eject out (silent aspiration) Pharyngeal- Thin Straw -- Pharyngeal -- Pharyngeal- Puree -- Pharyngeal -- Pharyngeal- Mechanical Soft -- Pharyngeal -- Pharyngeal- Regular WFL Pharyngeal -- Pharyngeal- Multi-consistency -- Pharyngeal -- Pharyngeal- Pill -- Pharyngeal -- Pharyngeal Comment --  CHL IP CERVICAL ESOPHAGEAL PHASE 08/23/2018 Cervical Esophageal Phase WFL Pudding Teaspoon -- Pudding Cup -- Honey Teaspoon -- Honey Cup -- Nectar Teaspoon -- Nectar Cup -- Nectar Straw -- Thin Teaspoon -- Thin Cup -- Thin Straw -- Puree -- Mechanical Soft -- Regular -- Multi-consistency -- Pill -- Cervical Esophageal Comment -- [place] Siren 08/23/2018, 10:14 AM Orbie Pyo Colvin Caroli.Ed Actor Pager [phone] Office [phone]                   Scheduled Meds:  aspirin EC  81 mg Oral Daily   Lifitegrast  1-2 drop Both Eyes Daily   metoprolol tartrate  12.5 mg Oral BID   pantoprazole  40 mg Oral QODAY   rosuvastatin  5 mg Oral q1800     tamsulosin  0.4 mg Oral Daily   Continuous Infusions:  cefTRIAXone (ROCEPHIN)  IV 1 g (08/22/18 2107)     LOS: 1 day        Aline August, MD [facility] 08/23/2018, 10:45 AM

## 2018-08-23 NOTE — Progress Notes (Signed)
Daily Progress Note   Patient Name: Evan [name]       Date: 08/23/2018 DOB: 12-11-1927  Age: 85 y.o. MRN#: [id number] Attending Physician: Aline August, MD Primary Care Physician: [name], MD Admit Date: 08/21/2018  Reason for Consultation/Follow-up: Establishing goals of care  Subjective: Patient awake, alert, able to appropriately identify name, dob, and that he was in the [facility]). He denies pain. States he is short of breath sometimes. He is extremely hard of hearing. Continues to state he is ready to go home, he doesn't need all of this. He states he is concerned that he has not heard from his daughter or son today.   I called daughter, Evan [name] at the bedside allowing her to talk with patient. Patient expressed his concern of not hearing from her. He also expressed he was ready to come back home, he was tired of being in the hospital, and he didn't need all this care. Daughter expressed to him that she and her brother wanted him to get treated for his infection and then he could come home. Patient verbalized understanding.   I attempted to explain to daughter, given patient's continued statements of wanting to return home and not receive "all of this care", she and her siblings should consider patient's expressed wishes and quality of life. I discussed his poor prognosis, continuous health decline, and the high likelihood that he would continue to require frequent hospitalizations which it sounds as though he is not interested in. I shared with her again his current illness and co-morbidities in relation to his generalized signs of deconditioning. Daughter verbalized understanding and expressed she is going to talk to her brothers. She expressed she still would like to  continue with treatment.   I discussed his current full code status also. Daughter requested to continue to have discussions with her brothers and if they chose to transition to DNR/DNI she would let me know.   She verbalized wishes for him to return home with palliative support at minimum and continue with [facility] team.    Length of Stay: 1  Current Medications: Scheduled Meds:  . aspirin EC  81 mg Oral Daily  . Lifitegrast  1-2 drop Both Eyes Daily  . metoprolol tartrate  12.5 mg Oral BID  . pantoprazole  40 mg Oral  QODAY  . rosuvastatin  5 mg Oral q1800  . tamsulosin  0.4 mg Oral Daily    Continuous Infusions: . cefTRIAXone (ROCEPHIN)  IV 1 g (08/22/18 2107)    PRN Meds: acetaminophen **OR** acetaminophen, senna-docusate  Physical Exam      General: NAD, chronically-ill frail appearing, thin Cardiovascular: regular rate and rhythm Pulmonary: diminished bases, crackles  Abdomen: soft, nontender, + bowel sounds Extremities: no edema, no joint deformities Skin: no rashes Neurological: Weakness, awake, alert, pleasantly confused at times       Vital Signs: BP 116/62 (BP Location: Left Arm)   Pulse 68   Temp 97.6 F (36.4 C) (Oral)   Resp 18   Ht 5\' 5"  (1.651 m)   Wt 72.4 kg   SpO2 100%   BMI 26.56 kg/m  SpO2: SpO2: 100 % O2 Device: O2 Device: Nasal Cannula O2 Flow Rate: O2 Flow Rate (L/min): 2 L/min  Intake/output summary:   Intake/Output Summary (Last 24 hours) at 08/23/2018 1230 Last data filed at 08/23/2018 1014 Gross per 24 hour  Intake 240 ml  Output 500 ml  Net -260 ml   LBM: Last BM Date: 08/22/18 Baseline Weight: Weight: 71.2 kg Most recent weight: Weight: 72.4 kg       Palliative Assessment/Data: PPS 30%    Flowsheet Rows     Most Recent Value  Intake Tab  Referral Department  Hospitalist  Unit at Time of Referral  Med/Surg Unit  Palliative Care Primary Diagnosis  Cancer  Date Notified  08/22/18  Palliative Care Type  New Palliative care   Reason for referral  Clarify Goals of Care  Date of Admission  08/21/18  # of days IP prior to Palliative referral  1  Clinical Assessment  Psychosocial & Spiritual Assessment  Palliative Care Outcomes      Patient Active Problem List   Diagnosis Date Noted  . AMS (altered mental status) 08/22/2018  . Altered mental status 08/21/2018  . UTI (urinary tract infection) 08/21/2018  . Abnormality of gait 08/20/2018  . Prolonged Q-T interval on ECG   . Dysphagia, post-stroke   . Supplemental oxygen dependent   . Anxiety state   . Fatigue   . SOB (shortness of breath)   . Hyperglycemia   . History of lung cancer   . Depression   . CKD (chronic kidney disease), stage III ([place])   . Acute blood loss anemia   . Fall 07/16/2018  . 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 Acute spontaneous 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 ([place]) L thalamic 07/14/2018  . Cellulitis of arm, right 06/01/2018  . Rash and nonspecific skin eruption 06/01/2018  . Chest pain 02/02/2018  . Thrombocytopenia ([place]) 02/02/2018  . Hypokalemia 02/02/2018  . Pulmonary embolism ([place]) 02/02/2018  . Elevated troponin 02/02/2018  . Pulmonary embolus ([place]) 02/02/2018  . Non-ST elevation MI (NSTEMI) ([place]) 06/27/2017  . Diastolic CHF ([place]) 73/71/0626  . Chest pain due to CAD ([place]) 06/26/2017  . Dyspnea 06/21/2017  . Acute bronchitis 05/15/2017  . Oral candidiasis 05/15/2017  . Acute pericarditis   . Chest pain at rest 12/20/2016  . Angina pectoris ([place]) 12/20/2016  . Radiation pneumonitis ([place]) 11/09/2016  . Pleurisy 10/11/2016  . Chest wall pain 10/11/2016  . Atrial fibrillation ([place]) 05/21/2016  . Adenocarcinoma of right lung ([place]) 01/06/2016  . GAD (generalized anxiety disorder) 11/20/2015  . Essential hypertension   . Cellulitis 09/04/2015  . Cellulitis of left axilla   .  Esophageal ulcer without bleeding   . Bilateral lower  extremity edema 04/28/2014  . BPH (benign prostatic hyperplasia) 10/08/2012  . Anxiety   . EKG abnormalities 09/05/2012  . Tremor 09/05/2012  . Chronic fatigue 09/05/2012  . Arthritis   . Asthma, chronic   . Reflux esophagitis   . S/P CABG (coronary artery bypass graft)   . GERD (gastroesophageal reflux disease)   . Hyperlipidemia   . Hypertension   . UNSPECIFIED PERIPHERAL VASCULAR DISEASE 06/16/2009    Palliative Care Assessment & Plan   Patient Profile: Palliative Care consult requested for this 85 y.o. male with multiple medical problems including defibrillation (no anticoagulation), PE, stage I right lung adenocarcinoma S/B SBRT, radiation fibrosis, hypertension, chronic kidney disease stage II, hyperlipidemia, hard of hearing, anxiety, depression, coronary artery disease, BPH, and MI.  Patient was recently admitted in April 2020 with a small left traumatic intracranial hemorrhage.  He was discharged to inpatient rehab (07/16/18-08/15/18) and then to home with family.  He was admitted after EMS was called due to shortness of breath.  X-ray showed trace bilateral pleural effusions.  Patient was also positive for UTI and started on IV ceftriaxone.  COVID test negative.  Recommendations/Plan:  Full Code per family  Continue to treat  Home with Palliative   PMT will continue to follow and support as needed.   Goals of Care and Additional Recommendations:  Limitations on Scope of Treatment: Full Scope Treatment  Code Status:    Code Status Orders  (From admission, onward)         Start     Ordered   08/21/18 2139  Full code  Continuous     08/21/18 2139        Code Status History    Date Active Date Inactive Code Status Order ID Comments User Context   07/16/2018 1506 08/15/2018 1355 Full Code [id number]  Flora Lipps Inpatient   07/14/2018 0658 07/16/2018 1447 Full Code [id number]  Amie [place], MD ED   06/01/2018 1426 06/04/2018 1607 DNR [id number]  [name], MD ED   02/02/2018 1246 02/03/2018 1756 Full Code [id number]  Toy Baker, MD ED   06/26/2017 1558 06/28/2017 1503 Full Code [id number]  Kathi Ludwig, MD ED   12/20/2016 1932 12/23/2016 2239 Full Code [id number]  Isaiah Serge, NP ED   05/21/2016 0351 05/23/2016 1859 Full Code [id number]  Vianne Bulls, MD ED   09/04/2015 1521 09/10/2015 1534 Full Code [id number]  Rondel Jumbo, PA-C ED      Prognosis:   Guarded to Poor   Discharge Planning:  Home with Home Health and Palliative   Thank you for allowing the Palliative Medicine Team to assist in the care of this patient.  Total Time: 45 min  Greater than 50%  of this time was spent counseling and coordinating care related to the above assessment and plan.  Alda Lea, AGPCNP-BC Palliative Medicine Team  Phone: [phone] Pager: [phone] Amion: Bjorn Pippin    Please contact Palliative Medicine Team phone at [phone] for questions and concerns.

## 2018-08-24 ENCOUNTER — Other Ambulatory Visit: Payer: Self-pay

## 2018-08-24 ENCOUNTER — Other Ambulatory Visit: Payer: Self-pay | Admitting: *Deleted

## 2018-08-24 LAB — BASIC METABOLIC PANEL
Anion gap: 10 (ref 5–15)
BUN: 11 mg/dL (ref 8–23)
CO2: 24 mmol/L (ref 22–32)
Calcium: 9.2 mg/dL (ref 8.9–10.3)
Chloride: 105 mmol/L (ref 98–111)
Creatinine, Ser: 1.07 mg/dL (ref 0.61–1.24)
GFR calc Af Amer: >60 mL/min (ref >60)
GFR calc non Af Amer: >60 mL/min (ref >60)
Glucose, Bld: 120 mg/dL — ABNORMAL HIGH (ref 70–99)
Potassium: 4.1 mmol/L (ref 3.5–5.1)
Sodium: 139 mmol/L (ref 135–145)

## 2018-08-24 LAB — URINE CULTURE: Culture: >=100000 — AB

## 2018-08-24 LAB — CBC WITH DIFFERENTIAL/PLATELET
Abs Immature Granulocytes: 0.06 10*3/uL (ref 0.00–0.07)
Basophils Absolute: 0 10*3/uL (ref 0.0–0.1)
Basophils Relative: 0 %
Eosinophils Absolute: 0.1 10*3/uL (ref 0.0–0.5)
Eosinophils Relative: 1 %
HCT: 32.1 % — ABNORMAL LOW (ref 39.0–52.0)
Hemoglobin: 10.5 g/dL — ABNORMAL LOW (ref 13.0–17.0)
Immature Granulocytes: 1 %
Lymphocytes Relative: 19 %
Lymphs Abs: 1.5 10*3/uL (ref 0.7–4.0)
MCH: 30.2 pg (ref 26.0–34.0)
MCHC: 32.7 g/dL (ref 30.0–36.0)
MCV: 92.2 fL (ref 80.0–100.0)
Monocytes Absolute: 0.9 10*3/uL (ref 0.1–1.0)
Monocytes Relative: 12 %
Neutro Abs: 5.4 10*3/uL (ref 1.7–7.7)
Neutrophils Relative %: 67 %
Platelets: 153 10*3/uL (ref 150–400)
RBC: 3.48 MIL/uL — ABNORMAL LOW (ref 4.22–5.81)
RDW: 13.8 % (ref 11.5–15.5)
WBC: 7.9 10*3/uL (ref 4.0–10.5)
nRBC: 0 % (ref 0.0–0.2)

## 2018-08-24 LAB — MAGNESIUM: Magnesium: 1.8 mg/dL (ref 1.7–2.4)

## 2018-08-24 MED ORDER — CAMPHOR-MENTHOL 0.5-0.5 % EX LOTN: 1.0000 "application " | TOPICAL_LOTION | Freq: Every day | CUTANEOUS | Status: DC | PRN

## 2018-08-24 MED ORDER — ALBUTEROL SULFATE HFA 108 (90 BASE) MCG/ACT IN AERS: 2.0000 | INHALATION_SPRAY | RESPIRATORY_TRACT | 0 refills | Status: DC | PRN

## 2018-08-24 MED ORDER — GERHARDT'S BUTT CREAM: 1.0000 "application " | TOPICAL_CREAM | Freq: Two times a day (BID) | CUTANEOUS | 0 refills | Status: DC

## 2018-08-24 MED ORDER — RESOURCE THICKENUP CLEAR PO POWD
ORAL | Status: DC | PRN
  Filled 2018-08-24: qty 125

## 2018-08-24 MED ORDER — CEPHALEXIN 500 MG PO CAPS: 500.0000 mg | ORAL_CAPSULE | Freq: Two times a day (BID) | ORAL | 0 refills | Status: AC

## 2018-08-24 MED FILL — CEPHALEXIN 500 MG CAPSULE: 500 | 4 days supply | Qty: 8 | Fill #0

## 2018-08-24 NOTE — TOC Transition Note (Addendum)
Transition of Care [facility]) - CM/SW Discharge Note   Patient Details  Name: Evan [name] MRN: [id number] Date of Birth: 10-21-1927  Transition of Care [facility]) CM/SW Contact:  Sharin Mons, RN Phone Number: 08/24/2018, 10:04 AM   Clinical Narrative:   Admitted with AMS. Pt will transition to home today with the resumption of home health services provided by [facility] ( PT,OT,RN). Declined SNF placement. Referral made with [employer] for DME: oxygen. Portable oxygen concentrator and tank will be delivered to pt's bedside prior to discharged. Daughter Evan [name] to provide transportation to home.   Andreas Blower (Daughter) Kwadwo Taras [phone]        08/24/2018 @ 1400 referral made with [facility] for outpatient palliative care. Office to f/u with pt and pt's PCP regarding services.  Expected discharge: home with home health, DECLINED SNF. Barriers to Discharge: Barriers Resolved   Patient Goals and CMS Choice Patient states their goals for this hospitalization and ongoing recovery are:: im going home CMS Medicare.gov Compare Post Acute Care list provided to:: Patient Choice offered to / list presented to : Patient  Discharge Placement                       Discharge Plan and Services In-house Referral: Clinical Social Work, Hospice / [place] Acute Care Choice: Resumption of Svcs/PTA Provider, Home Health          DME Arranged: Oxygen DME Agency: [employer] Date DME Agency Contacted: 08/24/18 Time DME Agency Contacted: [phone] Representative spoke with at DME Agency: [place]: [place] ([place]) Date [place]: 08/24/18 Time [place]: 0920 Representative spoke with at [place]: [place] made Butch Penny aware of d/c plan for 5/22, pt active with [facility]  Social Determinants of Health (SDOH) Interventions     Readmission Risk Interventions Readmission Risk Prevention Plan  07/16/2018  Transportation Screening Complete  Palliative Care Screening Not Applicable  Medication Review (RN Care Manager) Referral to Pharmacy  Some recent data might be hidden

## 2018-08-24 NOTE — Progress Notes (Signed)
Discharge instructions given to patients daughter [name]. All questions answered and prescriptions sent to pharmacy. Patient discharged to car via wheelchair.

## 2018-08-24 NOTE — Discharge Summary (Signed)
Physician Discharge Summary  Evan [name] IPJ:[id number] DOB: 27-Nov-1927 DOA: 08/21/2018  PCP: [name], MD  Admit date: 08/21/2018 Discharge date: 08/24/2018  Admitted From: Home Disposition: Home  Recommendations for Outpatient Follow-up:  1. Follow up with PCP in 1 week with repeat CBC/BMP 2. Outpatient follow-up with palliative care 3. Outpatient follow-up with pulmonary 4. Outpatient follow-up with neurology 5. Follow up in ED if symptoms worsen or new appear 6. Consider changing CODE STATUS to DNR and if condition worsens, consider hospice care.   Home Health: PT/OT/RN  equipment/Devices: Oxygen via nasal cannula at 2 L/min for hypoxia.  Oxygen saturation dropped to the 80s on ambulation.  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  During recent hospitalization in CIR stay, patient was started on scheduled Xanax.  Palliative care discussions were recommended, however family declined per discharge summary.  In the ED, he was found to have urinalysis suggestive of UTI, chest x-ray showed trace bilateral pleural effusions with improved aeration at the left lung base and persistent opacity at the right lung base.  CT of the head showed interval evolution of left thalamic infarct without new or acute intracranial abnormality.   He was started on IV antibiotics.  Urine culture grew E. coli.  Mental status improved slightly.  MRI of the brain was held off.  Palliative care was also consulted for goals of care.  Patient currently remains full code.  Family refused SNF placement.  He will be discharged home on oral antibiotics.  Discharge Diagnoses:  Principal Problem:   Altered mental status Active Problems:   Hyperlipidemia   Hypertension   Anxiety   Atrial fibrillation (HCC)   Thalamic hemorrhage (HCC)   UTI (urinary tract infection)   AMS (altered mental status)  Acute metabolic encephalopathy -Most likely from UTI.  CT of the head showed interval evolution of left thalamic infarct without new or acute intracranial abnormality -Mental status improving.  Patient is very hard of hearing.  Probably has some undiagnosed dementia as well. -Mental status has improved slightly so MRI of the brain was held.  Outpatient follow-up with neurology.  Hypoxia -Patient still needing oxygen via nasal cannula.  On ambulation, his oxygen saturation drops down to the 80s.  He will need to liters oxygen via nasal cannula upon discharge.  Outpatient follow-up with pulmonary.  Urinary tract infection -Treated with Rocephin.  Urine culture grew E. coli.  We will switch to oral Keflex for 4 more days upon discharge today.  Recent left thalamic intracranial hemorrhage -CT head without contrast showed interval evaluation of the left thalamic infarct without new or intracranial abnormality.  Continue aspirin 81 mg per prior neurology recommendations.  Spoke with Dr. Arora/neurology on phone on 08/22/2018 who reviewed the CAT head and recommended no further work-up and outpatient follow-up with neurology -No longer on anticoagulation for A. Fib -PT recommends SNF placement.    Family refused SNF.  Discharge home with home health PT/OT/RN. -Diet as per SLP recommendations.  Generalized deconditioning -Overall prognosis is very poor.     Palliative care evaluation appreciated.  Patient still remains full code.  Palliative care recommends outpatient palliative care follow-up. -Consider changing CODE STATUS to DNR.  Consider hospice if condition does not improve.  Paroxysmal A. fib -Rate controlled.  Continue metoprolol.  Hypertension  -Currently stable.  Continue Lopressor  Hyperlipidemia -Continue Crestor  Anxiety -Patient was on scheduled Ativan during recent admission.    Ativan held.  Will not prescribe Ativan on discharge.  Discharge Instructions  Discharge Instructions    Ambulatory referral to Pulmonology   Complete by:  As directed    Hypoxia   Diet - low sodium heart healthy   Complete by:  As directed    Increase activity slowly   Complete by:  As directed      Allergies as of 08/24/2018      Reactions   Feldene [piroxicam] Other (See Comments)   REACTION: Blisters   Statins Other (See Comments)   Myalgias   Doxycycline Other (See Comments)   REACTION:  unknown   Latex Rash   Tequin Anxiety   Valium Other (See Comments)   REACTION:  unknown      Medication List    STOP taking these medications   cycloSPORINE 0.05 % ophthalmic emulsion Commonly known as:  RESTASIS   LORazepam 0.5 MG tablet Commonly known as:  ATIVAN     TAKE these medications   acetaminophen 325 MG tablet Commonly known as:  TYLENOL Take 1-2 tablets (325-650 mg total) by mouth every 4 (four) hours as needed for mild pain.   albuterol 108 (90 Base) MCG/ACT inhaler Commonly known as:  VENTOLIN HFA Inhale 2 puffs into the lungs every 4 (four) hours as needed for wheezing or shortness of breath.   aspirin 81 MG EC tablet Take 1 tablet (81 mg total) by mouth daily.   camphor-menthol lotion Commonly known as:  SARNA Apply 1 application topically daily as needed for itching.   cephALEXin 500 MG capsule Commonly known as:  KEFLEX Take 1 capsule (500 mg total) by mouth 2 (two) times daily for 4 days.    furosemide 20 MG tablet Commonly known as:  LASIX Take 1 tablet (20 mg total) by mouth daily.   Gerhardt's butt cream Crea Apply 5 application topically 2 (two) times daily. What changed:  Another medication with the same name was added. Make sure you understand how and when to take each.   Gerhardt's butt cream Crea Apply 1 application topically 2 (two) times daily. What changed:  You were already taking a medication with the same name, and this prescription was added. Make sure you understand how and when to take each.   magnesium oxide 400 (241.3 Mg) MG tablet Commonly known as:  MAG-OX Take 1 tablet (400 mg total) by mouth 2 (two) times daily.   metoprolol tartrate 25 MG tablet Commonly known as:  LOPRESSOR Take 0.5 tablets (12.5 mg total) by mouth 2 (two) times daily.   nitroGLYCERIN 0.4 MG SL tablet Commonly known as:  NITROSTAT Place 1 tablet (0.4 mg total) under the tongue every 5 (five) minutes as needed for chest pain.   pantoprazole 40 MG tablet Commonly known as:  PROTONIX Take 1 tablet (40 mg total) by mouth every other day.   polyethylene glycol 17 g packet Commonly known as:  MIRALAX / GLYCOLAX Take 17 g by mouth daily as needed for mild constipation.   rosuvastatin 5 MG tablet Commonly known  as:  CRESTOR Take 1 tablet (5 mg total) by mouth daily at 6 PM.   tamsulosin 0.4 MG Caps capsule Commonly known as:  FLOMAX Take 1 capsule (0.4 mg total) by mouth daily.   Xiidra 5 % Soln Generic drug:  Lifitegrast Place 1-2 drops into both eyes daily.            Durable Medical Equipment  (From admission, onward)         Start     Ordered   08/24/18 0911  DME Oxygen  Once    Question Answer Comment  Length of Need 6 Months   Mode or (Route) Nasal cannula   Liters per Minute 2   Frequency Continuous (stationary and portable oxygen unit needed)   Oxygen conserving device Yes   Oxygen delivery system Gas      08/24/18 0912         Follow-up  Information    [place] Follow up.   Why:  Home health services arranged, [phone]       [place] Follow up.   Why:  portable oxygen concentrator and tank will be delivered to bedside prior to discharge       [name], MD. Schedule an appointment as soon as possible for a visit in 1 week(s).   Specialty:  Family Medicine Why:  With repeat CBC/BMP Contact information: [address] [place] [address] [place] [address] [phone]        Jettie Booze, MD .   Specialties:  Cardiology, Radiology, Interventional Cardiology Contact information: [address] [place] [address] [place] [address] [phone]        Palliative care. Schedule an appointment as soon as possible for a visit in 1 week(s).          Allergies  Allergen Reactions  . Feldene [Piroxicam] Other (See Comments)    REACTION: Blisters  . Statins Other (See Comments)    Myalgias  . Doxycycline Other (See Comments)    REACTION:  unknown  . Latex Rash  . Tequin Anxiety  . Valium Other (See Comments)    REACTION:  unknown    Consultations:  Palliative care   Procedures/Studies: Dg Chest 2 View  Result Date: 08/23/2018 CLINICAL DATA:  Shortness of breath and weakness EXAM: CHEST - 2 VIEW COMPARISON:  08/21/2018 FINDINGS: Cardiac shadows within normal limits. Aortic calcifications are noted. Postsurgical changes are again seen. The lungs are well aerated with evidence of mild atelectatic changes in the right base and right upper lobe along the minor fissure. Mild left basilar atelectasis is noted. No bony abnormality is seen. IMPRESSION: Mild bibasilar atelectasis as well as right upper lobe atelectatic changes. Follow-up imaging is recommended. Electronically Signed   By: Inez Catalina M.D.   On: 08/23/2018 09:07   Ct Head Wo Contrast  Result Date: 08/21/2018 CLINICAL DATA:  Recent fall. EXAM: CT HEAD WITHOUT CONTRAST TECHNIQUE: Contiguous axial images were  obtained from the base of the skull through the vertex without intravenous contrast. COMPARISON:  07/30/2018 FINDINGS: Brain: Interval evolution of left thalamic hemorrhagic infarct. Hemorrhagic component has resolved in the interval. No hydrocephalus or midline shift. No abnormal extra-axial fluid collection. No CT evidence for acute ischemia. Diffuse loss of parenchymal volume is consistent with atrophy. Patchy low attenuation in the deep hemispheric and periventricular white matter is nonspecific, but likely reflects chronic microvascular ischemic demyelination. Lacunar infarct noted right cerebellum. Vascular: No hyperdense vessel or unexpected calcification. Skull: No  evidence for fracture. No worrisome lytic or sclerotic lesion. Sinuses/Orbits: Chronic left maxillary sinusitis. Surgical changes right mastoid air cells. Visualized portions of the globes and intraorbital fat are unremarkable. Other: None. IMPRESSION: 1. Interval evolution of the left thalamic infarct. No new or acute intracranial abnormality on today's study. 2. Atrophy with chronic small vessel white matter ischemic disease. Electronically Signed   By: Misty Stanley M.D.   On: 08/21/2018 18:21   Ct Head Wo Contrast  Result Date: 07/30/2018 CLINICAL DATA:  85 year old male with history of stroke. Follow-up study. EXAM: CT HEAD WITHOUT CONTRAST TECHNIQUE: Contiguous axial images were obtained from the base of the skull through the vertex without intravenous contrast. COMPARISON:  Head CT 07/23/2018. FINDINGS: Brain: Previously noted high attenuation focus centered in the left side of the thalamus has decreased in size, currently measuring 10 x 18 x 16 mm (axial image 17 of series 3 and coronal image 44 of series 5), with persistent surrounding decreased attenuation in the adjacent parenchyma, compatible with some adjacent edema. This results in only minimal local mass effect. No new sites of hemorrhage are noted. No hydrocephalus. No  extra-axial fluid collections. No midline shift or signs of herniation. Mild cerebral atrophy. Patchy and confluent areas of decreased attenuation are noted throughout the deep and periventricular white matter of the cerebral hemispheres bilaterally, compatible with chronic microvascular ischemic disease. Vascular: No hyperdense vessel or unexpected calcification. Skull: Normal. Negative for fracture or focal lesion. Sinuses/Orbits: Mucoperiosteal thickening and volume loss in the left maxillary sinus related to chronic sinusitis. Status post left maxillary antrectomy. Status post right mastoidectomy. Other: None. IMPRESSION: 1. Slight decreased size of hemorrhagic infarct in the left thalamus with small amount of surrounding edema and mild local mass effect, which also appear to be slightly improved. No new acute findings. 2. Mild cerebral atrophy with chronic microvascular ischemic changes in the cerebral white matter. 3. Sequela of chronic left maxillary sinusitis. Electronically Signed   By: Vinnie Langton M.D.   On: 07/30/2018 08:08   Dg Chest Port 1 View  Result Date: 08/21/2018 CLINICAL DATA:  Shortness of breath.  Pneumonia. EXAM: PORTABLE CHEST 1 VIEW COMPARISON:  08/02/2018 FINDINGS: The cardiac silhouette is enlarged. The patient is status post prior median sternotomy. There are small bilateral pleural effusions. There is a right lower lobe airspace opacity. The previously noted left lower lobe airspace opacity has improved. There is no pneumothorax. IMPRESSION: 1. Cardiomegaly. The patient is status post prior median sternotomy. 2. Trace to small bilateral pleural effusions. 3. Improved aeration at the left lung base. Persistent opacity at the right lung base favored to represent atelectasis with infiltrate not entirely excluded. Electronically Signed   By: Constance Holster M.D.   On: 08/21/2018 16:16   Dg Chest Port 1 View  Result Date: 08/02/2018 CLINICAL DATA:  Atelectasis. EXAM: PORTABLE  CHEST 1 VIEW COMPARISON:  CT chest and chest x-ray dated July 21, 2018. FINDINGS: Stable cardiomediastinal silhouette. Atherosclerotic calcification of the aortic arch. Mildly increased interstitial markings at the lung bases are similar to prior study. Unchanged mild bibasilar atelectasis and trace bilateral pleural effusions. No consolidation or pneumothorax. No acute osseous abnormality. IMPRESSION: 1. Similar appearing bibasilar atelectasis, trace pleural effusions, and mild interstitial edema. Electronically Signed   By: Titus Dubin M.D.   On: 08/02/2018 11:11   Dg Swallowing Func-speech Pathology  Result Date: 08/23/2018 Objective Swallowing Evaluation: Type of Study: MBS-Modified Barium Swallow Study  Patient Details Name: MOHAMED PORTLOCK MRN: [id number] Date  of Birth: July 09, 1927 Today's Date: 08/23/2018 Time: SLP Start Time (ACUTE ONLY): 0740 -SLP Stop Time (ACUTE ONLY): 0756 SLP Time Calculation (min) (ACUTE ONLY): 16 min Past Medical History: Past Medical History: Diagnosis Date . Adenocarcinoma of right lung ([place]) 01/06/2016 . Anginal chest pain at rest [facility])   Chronic non, controlled on Lorazepam . Anxiety  . Arthritis   "all over"  . BPH (benign prostatic hyperplasia)  . CAD (coronary artery disease)  . Chronic lower back pain  . Complication of anesthesia   "problems making water afterwards" . Depression  . DJD (degenerative joint disease)  . Esophageal ulcer without bleeding   bid ppi indefinitely . Family history of adverse reaction to anesthesia   "children w/PONV" . GERD (gastroesophageal reflux disease)  . Headache   "couple/week maybe" (09/04/2015) . Hemochromatosis   Possible, elevated iron stores, hook-like osteophytes on hand films, normal LFTs . Hepatitis   "yellow jaundice as a baby" . History of ASCVD   MULTIVESSEL . Hyperlipidemia  . Hypertension  . Myocardial infarction ([place]) 1999 . Osteoarthritis  . Pneumonia   "several times; got a little now" (09/04/2015) . Rheumatoid arthritis ([place])  .  Subdural hematoma (HCC)   small after fall 09/2016-plavix held, neurosurgery consulted.  Asymptomatic.   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 ARM SKIN LESION BIOPSY / EXCISION Left 09/02/2015 . CATARACT EXTRACTION W/ INTRAOCULAR LENS  IMPLANT, BILATERAL Bilateral  . CHOLECYSTECTOMY OPEN   . CORNEAL TRANSPLANT Bilateral   "one at [facility]; one at [facility]" . CORONARY ANGIOPLASTY WITH STENT PLACEMENT   . CORONARY ARTERY BYPASS GRAFT  1999 . DESCENDING AORTIC ANEURYSM REPAIR W/ STENT    DE stent ostium into the right radial free graft at OM-, 02-2007 . ESOPHAGOGASTRODUODENOSCOPY N/A 11/09/2014  Procedure: ESOPHAGOGASTRODUODENOSCOPY (EGD);  Surgeon: [name], MD;  Location: [facility] ENDOSCOPY;  Service: Endoscopy;  Laterality: N/A; . INGUINAL HERNIA REPAIR   . JOINT REPLACEMENT   . LEFT HEART CATH AND CORS/GRAFTS ANGIOGRAPHY N/A 06/27/2017  Procedure: LEFT HEART CATH AND CORS/GRAFTS ANGIOGRAPHY;  Surgeon: [name], MD;  Location: [place] CV LAB;  Service: Cardiovascular;  Laterality: N/A; . NASAL SINUS SURGERY   . SHOULDER OPEN ROTATOR CUFF REPAIR Right  . TOTAL KNEE ARTHROPLASTY Bilateral  HPI: Pt is an 85 y/o male admitted secondary to AMS and SOB. AMS likely secondary to UTI. CT revealed evolution of L thalamic infarct. PMH includes MI, CAD, A fib, HTN, CVA with R sided deficits, PE, and R lung adenocarcinoma.  Patient was recently discharged home after an extended CIR stay (4/14-5/12) and was discharged home with family and [place] SLP, on Dys 2, nectar thick liquids diet.  Subjective: pleasant, hard of hearing but has hearing aids (left one seems to work, right does not) Assessment / Plan / Recommendation CHL IP CLINICAL IMPRESSIONS 08/23/2018 Clinical Impression Pt's oral phase overall functional except for min-mild right buccal cavity residue identified at end of MBS during oral care. Pharyngeal phase, pt's  demonstrates delayed closure of laryngeal vestibule/trachea with resultant silent aspiration with thin. Once laryngeal elevation and anterior hyoid movement is achieved larynx is protected however barium fills pyriform sinuses before elevation of larynx is initiated. Aspiration also occured with ching tuck position. He did not have residue post swallow and nectar thick slowed transit allowing for adequate airway protection. Negative observations when esophagus briefly scanned. Recommend continue Dys 2, nectar thick, crush pills and  full assist with meals.     SLP Visit Diagnosis Dysphagia, pharyngeal phase (R13.13) Attention and concentration deficit following -- Frontal lobe and executive function deficit following -- Impact on safety and function Moderate aspiration risk;Severe aspiration risk   CHL IP TREATMENT RECOMMENDATION 08/23/2018 Treatment Recommendations Therapy as outlined in treatment plan below   Prognosis 08/23/2018 Prognosis for Safe Diet Advancement Fair Barriers to Reach Goals -- Barriers/Prognosis Comment -- CHL IP DIET RECOMMENDATION 08/23/2018 SLP Diet Recommendations Dysphagia 2 (Fine chop) solids;Nectar thick liquid Liquid Administration via Cup;No straw Medication Administration Crushed with puree Compensations Minimize environmental distractions;Small sips/bites;Slow rate;Lingual sweep for clearance of pocketing Postural Changes Seated upright at 90 degrees   CHL IP OTHER RECOMMENDATIONS 08/23/2018 Recommended Consults -- Oral Care Recommendations Oral care BID Other Recommendations --   CHL IP FOLLOW UP RECOMMENDATIONS 08/23/2018 Follow up Recommendations Skilled Nursing facility;Home health SLP   CHL IP FREQUENCY AND DURATION 08/23/2018 Speech Therapy Frequency (ACUTE ONLY) min 2x/week Treatment Duration 1 week      CHL IP ORAL PHASE 08/23/2018 Oral Phase Impaired Oral - Pudding Teaspoon -- Oral - Pudding Cup -- Oral - Honey Teaspoon -- Oral - Honey Cup -- Oral - Nectar Teaspoon -- Oral - Nectar  Cup -- Oral - Nectar Straw -- Oral - Thin Teaspoon -- Oral - Thin Cup -- Oral - Thin Straw -- Oral - Puree -- Oral - Mech Soft -- Oral - Regular Right pocketing in lateral sulci Oral - Multi-Consistency -- Oral - Pill -- Oral Phase - Comment --  CHL IP PHARYNGEAL PHASE 08/23/2018 Pharyngeal Phase Impaired Pharyngeal- Pudding Teaspoon -- Pharyngeal -- Pharyngeal- Pudding Cup -- Pharyngeal -- Pharyngeal- Honey Teaspoon -- Pharyngeal -- Pharyngeal- Honey Cup -- Pharyngeal -- Pharyngeal- Nectar Teaspoon -- Pharyngeal -- Pharyngeal- Nectar Cup WFL Pharyngeal -- Pharyngeal- Nectar Straw -- Pharyngeal -- Pharyngeal- Thin Teaspoon -- Pharyngeal -- Pharyngeal- Thin Cup Penetration/Aspiration during swallow Pharyngeal Material enters airway, passes BELOW cords without attempt by patient to eject out (silent aspiration) Pharyngeal- Thin Straw -- Pharyngeal -- Pharyngeal- Puree -- Pharyngeal -- Pharyngeal- Mechanical Soft -- Pharyngeal -- Pharyngeal- Regular WFL Pharyngeal -- Pharyngeal- Multi-consistency -- Pharyngeal -- Pharyngeal- Pill -- Pharyngeal -- Pharyngeal Comment --  CHL IP CERVICAL ESOPHAGEAL PHASE 08/23/2018 Cervical Esophageal Phase WFL Pudding Teaspoon -- Pudding Cup -- Honey Teaspoon -- Honey Cup -- Nectar Teaspoon -- Nectar Cup -- Nectar Straw -- Thin Teaspoon -- Thin Cup -- Thin Straw -- Puree -- Mechanical Soft -- Regular -- Multi-consistency -- Pill -- Cervical Esophageal Comment -- [place] Siren 08/23/2018, 10:14 AM Orbie Pyo Colvin Caroli.Ed Actor Pager [phone] Office [phone]                  Subjective: Patient seen and examined at bedside.  He is more awake, very hard of hearing.  He keeps repeating that he wants to go home.  Slightly confused.  No overnight fever or vomiting reported by nursing staff.  Discharge Exam: Vitals:   08/24/18 0925 08/24/18 0931  BP:    Pulse:    Resp:    Temp:    SpO2: (!) 85% 92%    General: Pt is alert, awake, not in  acute distress Cardiovascular: rate controlled, S1/S2 + Respiratory: bilateral decreased breath sounds at bases Abdominal: Soft, NT, ND, bowel sounds + Extremities: no edema, no cyanosis    The results of significant diagnostics from this hospitalization (including imaging, microbiology, ancillary and laboratory) are listed below for reference.  Microbiology: Recent Results (from the past 240 hour(s))  SARS Coronavirus 2 (CEPHEID- Performed in [place] hospital lab), Hosp Order     Status: None   Collection Time: 08/21/18  4:57 PM  Result Value Ref Range Status   SARS Coronavirus 2 NEGATIVE NEGATIVE Final    Comment: (NOTE) If result is NEGATIVE SARS-CoV-2 target nucleic acids are NOT DETECTED. The SARS-CoV-2 RNA is generally detectable in upper and lower  respiratory specimens during the acute phase of infection. The lowest  concentration of SARS-CoV-2 viral copies this assay can detect is 250  copies / mL. A negative result does not preclude SARS-CoV-2 infection  and should not be used as the sole basis for treatment or other  patient management decisions.  A negative result may occur with  improper specimen collection / handling, submission of specimen other  than nasopharyngeal swab, presence of viral mutation(s) within the  areas targeted by this assay, and inadequate number of viral copies  (<250 copies / mL). A negative result must be combined with clinical  observations, patient history, and epidemiological information. If result is POSITIVE SARS-CoV-2 target nucleic acids are DETECTED. The SARS-CoV-2 RNA is generally detectable in upper and lower  respiratory specimens dur ing the acute phase of infection.  Positive  results are indicative of active infection with SARS-CoV-2.  Clinical  correlation with patient history and other diagnostic information is  necessary to determine patient infection status.  Positive results do  not rule out bacterial infection or  co-infection with other viruses. If result is PRESUMPTIVE POSTIVE SARS-CoV-2 nucleic acids MAY BE PRESENT.   A presumptive positive result was obtained on the submitted specimen  and confirmed on repeat testing.  While 2019 novel coronavirus  (SARS-CoV-2) nucleic acids may be present in the submitted sample  additional confirmatory testing may be necessary for epidemiological  and / or clinical management purposes  to differentiate between  SARS-CoV-2 and other Sarbecovirus currently known to infect humans.  If clinically indicated additional testing with an alternate test  methodology [phone]) is advised. The SARS-CoV-2 RNA is generally  detectable in upper and lower respiratory sp ecimens during the acute  phase of infection. The expected result is Negative. Fact Sheet for Patients:  StrictlyIdeas.no Fact Sheet for Healthcare Providers: BankingDealers.co.za This test is not yet approved or cleared by the [place] FDA and has been authorized for detection and/or diagnosis of SARS-CoV-2 by FDA under an Emergency Use Authorization (EUA).  This EUA will remain in effect (meaning this test can be used) for the duration of the COVID-19 declaration under Section 564(b)(1) of the Act, 21 U.S.C. section 360bbb-3(b)(1), unless the authorization is terminated or revoked sooner. Performed at [facility], [place] [address], [place], [place] [address]   Urine culture     Status: Abnormal   Collection Time: 08/21/18  7:34 PM  Result Value Ref Range Status   Specimen Description URINE, RANDOM  Final   Special Requests   Final    NONE Performed at [facility], [place] [address], [place], [place] [address]    Culture >=100,000 COLONIES/mL ESCHERICHIA COLI (A)  Final   Report Status 08/24/2018 FINAL  Final   Organism ID, Bacteria ESCHERICHIA COLI (A)  Final      Susceptibility   Escherichia coli - MIC*    AMPICILLIN >=32 RESISTANT  Resistant     CEFAZOLIN <=4 SENSITIVE Sensitive     CEFTRIAXONE <=1 SENSITIVE Sensitive     CIPROFLOXACIN >=4  RESISTANT Resistant     GENTAMICIN >=16 RESISTANT Resistant     IMIPENEM <=0.25 SENSITIVE Sensitive     NITROFURANTOIN <=16 SENSITIVE Sensitive     TRIMETH/SULFA <=20 SENSITIVE Sensitive     AMPICILLIN/SULBACTAM 16 INTERMEDIATE Intermediate     PIP/TAZO <=4 SENSITIVE Sensitive     Extended ESBL NEGATIVE Sensitive     * >=100,000 COLONIES/mL ESCHERICHIA COLI     Labs: BNP (last 3 results) Recent Labs    02/02/18 0537 08/02/18 1049 08/21/18 1530  BNP 605.2* 477.9* 616.0*   Basic Metabolic Panel: Recent Labs  Lab 08/21/18 1530 08/22/18 0243 08/23/18 0236 08/24/18 0359  NA 137 135 136 139  K 4.1 3.8 3.9 4.1  CL 103 103 107 105  CO2 22 20* 20* 24  GLUCOSE 107* 104* 108* 120*  BUN 11 11 14 11   CREATININE 1.26* 1.20 1.19 1.07  CALCIUM 8.8* 8.8* 8.7* 9.2  MG  --   --  1.8 1.8   Liver Function Tests: Recent Labs  Lab 08/21/18 1530 08/23/18 0236  AST 19 16  ALT 14 12  ALKPHOS 120 117  BILITOT 0.6 0.4  PROT 6.4* 5.5*  ALBUMIN 2.8* 2.5*   No results for input(s): LIPASE, AMYLASE in the last 168 hours. Recent Labs  Lab 08/23/18 0236  AMMONIA 15   CBC: Recent Labs  Lab 08/21/18 1530 08/22/18 0243 08/23/18 0236 08/24/18 0359  WBC 8.6 7.8 7.5 7.9  NEUTROABS 6.0  --  4.8 5.4  HGB 11.4* 10.8* 10.8* 10.5*  HCT 35.3* 33.0* 32.9* 32.1*  MCV 93.9 93.0 92.4 92.2  PLT 192 171 154 153   Cardiac Enzymes: Recent Labs  Lab 08/21/18 1530  TROPONINI <0.03   BNP: Invalid input(s): POCBNP CBG: No results for input(s): GLUCAP in the last 168 hours. D-Dimer No results for input(s): DDIMER in the last 72 hours. Hgb A1c No results for input(s): HGBA1C in the last 72 hours. Lipid Profile No results for input(s): CHOL, HDL, LDLCALC, TRIG, CHOLHDL, LDLDIRECT in the last 72 hours. Thyroid function studies Recent Labs    08/23/18 0236  TSH 1.378   Anemia  work up Recent Labs    08/23/18 0236  VITAMINB12 509  FOLATE 9.1   Urinalysis    Component Value Date/Time   COLORURINE YELLOW 08/21/2018 1800   APPEARANCEUR CLOUDY (A) 08/21/2018 1800   LABSPEC 1.008 08/21/2018 1800   PHURINE 7.0 08/21/2018 1800   GLUCOSEU NEGATIVE 08/21/2018 1800   HGBUR SMALL (A) 08/21/2018 1800   BILIRUBINUR NEGATIVE 08/21/2018 1800   KETONESUR NEGATIVE 08/21/2018 1800   PROTEINUR NEGATIVE 08/21/2018 1800   UROBILINOGEN 0.2 07/13/2014 1804   NITRITE POSITIVE (A) 08/21/2018 1800   LEUKOCYTESUR LARGE (A) 08/21/2018 1800   Sepsis Labs Invalid input(s): PROCALCITONIN,  WBC,  LACTICIDVEN Microbiology Recent Results (from the past 240 hour(s))  SARS Coronavirus 2 (CEPHEID- Performed in [place] hospital lab), Hosp Order     Status: None   Collection Time: 08/21/18  4:57 PM  Result Value Ref Range Status   SARS Coronavirus 2 NEGATIVE NEGATIVE Final    Comment: (NOTE) If result is NEGATIVE SARS-CoV-2 target nucleic acids are NOT DETECTED. The SARS-CoV-2 RNA is generally detectable in upper and lower  respiratory specimens during the acute phase of infection. The lowest  concentration of SARS-CoV-2 viral copies this assay can detect is 250  copies / mL. A negative result does not preclude SARS-CoV-2 infection  and should not be used as the sole basis for treatment or  other  patient management decisions.  A negative result may occur with  improper specimen collection / handling, submission of specimen other  than nasopharyngeal swab, presence of viral mutation(s) within the  areas targeted by this assay, and inadequate number of viral copies  (<250 copies / mL). A negative result must be combined with clinical  observations, patient history, and epidemiological information. If result is POSITIVE SARS-CoV-2 target nucleic acids are DETECTED. The SARS-CoV-2 RNA is generally detectable in upper and lower  respiratory specimens dur ing the acute phase of  infection.  Positive  results are indicative of active infection with SARS-CoV-2.  Clinical  correlation with patient history and other diagnostic information is  necessary to determine patient infection status.  Positive results do  not rule out bacterial infection or co-infection with other viruses. If result is PRESUMPTIVE POSTIVE SARS-CoV-2 nucleic acids MAY BE PRESENT.   A presumptive positive result was obtained on the submitted specimen  and confirmed on repeat testing.  While 2019 novel coronavirus  (SARS-CoV-2) nucleic acids may be present in the submitted sample  additional confirmatory testing may be necessary for epidemiological  and / or clinical management purposes  to differentiate between  SARS-CoV-2 and other Sarbecovirus currently known to infect humans.  If clinically indicated additional testing with an alternate test  methodology [phone]) is advised. The SARS-CoV-2 RNA is generally  detectable in upper and lower respiratory sp ecimens during the acute  phase of infection. The expected result is Negative. Fact Sheet for Patients:  StrictlyIdeas.no Fact Sheet for Healthcare Providers: BankingDealers.co.za This test is not yet approved or cleared by the [place] FDA and has been authorized for detection and/or diagnosis of SARS-CoV-2 by FDA under an Emergency Use Authorization (EUA).  This EUA will remain in effect (meaning this test can be used) for the duration of the COVID-19 declaration under Section 564(b)(1) of the Act, 21 U.S.C. section 360bbb-3(b)(1), unless the authorization is terminated or revoked sooner. Performed at [facility], [place] [address], [place], [place] [address]   Urine culture     Status: Abnormal   Collection Time: 08/21/18  7:34 PM  Result Value Ref Range Status   Specimen Description URINE, RANDOM  Final   Special Requests   Final    NONE Performed at [facility],  [place] [address] [place], [place] [address]    Culture >=100,000 COLONIES/mL ESCHERICHIA COLI (A)  Final   Report Status 08/24/2018 FINAL  Final   Organism ID, Bacteria ESCHERICHIA COLI (A)  Final      Susceptibility   Escherichia coli - MIC*    AMPICILLIN >=32 RESISTANT Resistant     CEFAZOLIN <=4 SENSITIVE Sensitive     CEFTRIAXONE <=1 SENSITIVE Sensitive     CIPROFLOXACIN >=4 RESISTANT Resistant     GENTAMICIN >=16 RESISTANT Resistant     IMIPENEM <=0.25 SENSITIVE Sensitive     NITROFURANTOIN <=16 SENSITIVE Sensitive     TRIMETH/SULFA <=20 SENSITIVE Sensitive     AMPICILLIN/SULBACTAM 16 INTERMEDIATE Intermediate     PIP/TAZO <=4 SENSITIVE Sensitive     Extended ESBL NEGATIVE Sensitive     * >=100,000 COLONIES/mL ESCHERICHIA COLI     Time coordinating discharge: 35 minutes  SIGNED:   Aline August, MD  [facility] 08/24/2018, 9:58 AM

## 2018-08-24 NOTE — Progress Notes (Signed)
Manufacturing engineer [facility])  Palliative  Received referral for [facility] Community Based Palliative services.  Spoke with [name], daughter, she confirmed need for services once pt returns home.    Called Dr. Samella Parr office and left a message with his RN that [place] will assist with his disease management once home.  Thank you for this referral, Venia Carbon RN, BSN, [facility] Liaison (in [place] under Hospice and [place]) [phone]

## 2018-08-24 NOTE — Consult Note (Signed)
   [facility] CM Inpatient Consult   08/24/2018  Evan [name] 21-Jan-1928 [id number]    Patient screened if potential Train[employer] Management services needed bythis [facility] member[facility] 22% high risk for unplanned readmission and 4 hospitalizations in past 6 months. Noted past outreach per [place] care management coordinator for [facility] HF calls follow-up.  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 During recent hospitalization in CIR stay, patient was started on scheduled Xanax. Palliative care discussions were recommended, however family declined per discharge summary. In the ED,  he was found to have urinalysis suggestive of UTI, chest x-ray showed trace bilateral pleural effusions with improved aeration at the left lung base and persistent opacity at the right lung base. CT of the head showed interval evolution of left thalamic infarct without new or acute intracranial abnormality. He was started on IV antibiotics.  Urine culture grew E. coli.  Mental status improved slightly.  MRI of the brain was held off.  Palliative care was also consulted for goals of care.   Family refused SNF placement.  He will be discharged home on oral antibiotics. (UTI-urinary tract infection, AMS-altered mental status, Thalamic hemorrhage)   Per palliative care consult  note, patient's daughter Evan [name]) wishes for him to return home with palliative support at minimum and continue with [facility] team.  Spoke with [facility] CM regarding discharge disposition and states that patient is going Home with resumption of home health services (PT, OT, RN) provided by [facility]; ([facility] for oxygen) and Outpatient Palliative services with Manufacturing engineer. Had declined SNF (skilled nursing facility) placement. No other barriers identified.  Called to speak with patient via hospital phone but no reply. Was able to speak to daughter Evan [name]) briefly over the phone (HIPAA) verified. She indicated having no needs or concerns for patient at this time, except for lorazepam prescription which is only good for 2 weeks, however, made her aware to let primary care provider know on patient's follow-up appointment for further prescription needs. Daughter also would like to know whom she could contact if needed, during the weekend/ holiday since PCP's office will be close. Notified her to call 911 for emergency and provided [facility] CM nurse advise line for non-emergency needs. Patient's primarycareprovider isDr.Warren [name] with [place], listed as providing transition of care follow-up.  Patient's daughter agreed that patient could benefit from [facility] calls to follow-up patient's continued recovery. Referral made for [facility] General calls after discharge.   For questions and additional information, please call:  Evan [name], BSN, RN-BC [facility] Liaison Cell: [phone]

## 2018-08-24 NOTE — Progress Notes (Signed)
SATURATION QUALIFICATIONS: (This note is used to comply with regulatory documentation for home oxygen)  Patient Saturations on Room Air at Rest = 85%  Patient Saturations on Room Air while Ambulating = patient does not ambulate  Patient Saturations on 2 Liters of oxygen at rest =92%  Please briefly explain why patient needs home oxygen: Patient desats on room without oxygen.

## 2018-08-24 NOTE — Progress Notes (Signed)
PALLIATIVE NOTE:   Patient asleep, but easily aroused. He is alert and extremely hard of hearing. He is happy to know he will get to go home to his family today. Asking if it was time to go. He is aware it would be later today most likely. Denies pain does endorse some shortness of breath at times. Informed him that he will most likely go home with oxygen. He expressed his appreciation.   Daughter [name] is aware of patient being discharged and will be transporting him home. She confirms their goal is for him to return home with home health. They are in agreement with outpatient Palliative for support. She and her siblings are in continuous conversation regarding code status and when to shift care. They would like to see how he does once he returns home and lean on the input of his outpatient team assessments. She is hopeful they can discuss code status more once patient is home now that he seems more alert to them.   All questions answered. Family appreciative of medical care given during his admission.   Total Time: 20 min  Greater than 50%  of this time was spent counseling and coordinating care related to the above assessment and plan.  Alda Lea, AGPCNP-BC Palliative Medicine Team  Phone: [phone] Pager: [phone] Amion: Bjorn Pippin

## 2018-08-24 NOTE — Progress Notes (Addendum)
  Speech Language Pathology Treatment: Dysphagia  Patient Details Name: Evan [name] MRN: [id number] DOB: [birth year] Today's Date: 08/24/2018 Time: 5525-8948 SLP Time Calculation (min) (ACUTE ONLY): 14 min  Assessment / Plan / Recommendation Clinical Impression  Intervention following MBS yesterday- pt did not recall having procedure; reviewed results and rec's. Observed with nectar thick juice and Dys 2 texture. Oral and pharyngeal swallow appeared normal subjectively. He needs full supervision and assist with meals. Spoke with daughter yesterday to review results and texture, nectar thick liquids recommended. She has thickener at home and is somewhat familiar. Recommend home health to follow for possible respiratory muscle strength training and further education with family. Pt discharging today. Will discharge ST.    HPI HPI: Pt is an 85 y/o male admitted secondary to AMS and SOB. AMS likely secondary to UTI. CT revealed evolution of L thalamic infarct. PMH includes MI, CAD, A fib, HTN, CVA with R sided deficits, PE, and R lung adenocarcinoma.  Patient was recently discharged home after an extended CIR stay (4/14-5/12) and was discharged home with family and [place] SLP, on Dys 2, nectar thick liquids diet.       SLP Plan  All goals met       Recommendations  Diet recommendations: Dysphagia 2 (fine chop);Nectar-thick liquid Liquids provided via: Cup;No straw Medication Administration: Whole meds with puree Supervision: Patient able to self feed;Staff to assist with self feeding;Full supervision/cueing for compensatory strategies Compensations: Minimize environmental distractions;Small sips/bites;Slow rate;Lingual sweep for clearance of pocketing Postural Changes and/or Swallow Maneuvers: Seated upright 90 degrees                Oral Care Recommendations: Oral care BID Follow up Recommendations: Home health SLP SLP Visit Diagnosis: Dysphagia, pharyngeal phase (R13.13) Plan: All goals  met       GO                Houston Siren 08/24/2018, 12:02 PM   Orbie Pyo Colvin Caroli.Ed Risk analyst [phone] Office [phone]

## 2018-08-25 DIAGNOSIS — Z86718 Personal history of other venous thrombosis and embolism: Secondary | ICD-10-CM | POA: Diagnosis not present

## 2018-08-25 DIAGNOSIS — N183 Chronic kidney disease, stage 3 (moderate): Secondary | ICD-10-CM | POA: Diagnosis not present

## 2018-08-25 DIAGNOSIS — I2510 Atherosclerotic heart disease of native coronary artery without angina pectoris: Secondary | ICD-10-CM | POA: Diagnosis not present

## 2018-08-25 DIAGNOSIS — I13 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 failure and stage 1 through stage 4 chronic kidney disease, or unspecified chronic kidney disease: Secondary | ICD-10-CM | POA: Diagnosis not present

## 2018-08-25 DIAGNOSIS — J449 Chronic obstructive pulmonary disease, unspecified: Secondary | ICD-10-CM | POA: Diagnosis not present

## 2018-08-25 DIAGNOSIS — I69218 Other symptoms and signs involving cognitive functions following other nontraumatic intracranial hemorrhage: Secondary | ICD-10-CM | POA: Diagnosis not present

## 2018-08-25 DIAGNOSIS — I509 Heart failure, unspecified: Secondary | ICD-10-CM | POA: Diagnosis not present

## 2018-08-25 DIAGNOSIS — I4891 Unspecified atrial fibrillation: Secondary | ICD-10-CM | POA: Diagnosis not present

## 2018-08-25 DIAGNOSIS — C3431 Malignant neoplasm of lower lobe, right bronchus or lung: Secondary | ICD-10-CM | POA: Diagnosis not present

## 2018-08-26 ENCOUNTER — Encounter (HOSPITAL_COMMUNITY): Payer: Self-pay

## 2018-08-26 ENCOUNTER — Other Ambulatory Visit: Payer: Self-pay

## 2018-08-26 ENCOUNTER — Emergency Department (HOSPITAL_COMMUNITY): Payer: Medicare HMO

## 2018-08-26 ENCOUNTER — Observation Stay (HOSPITAL_COMMUNITY)
Admission: EM | Admit: 2018-08-26 | Discharge: 2018-08-27 | Disposition: A | Discharge status: Home / Self Care | Payer: Medicare HMO | Attending: Internal Medicine | Admitting: Internal Medicine

## 2018-08-26 DIAGNOSIS — I4891 Unspecified atrial fibrillation: Secondary | ICD-10-CM | POA: Insufficient documentation

## 2018-08-26 DIAGNOSIS — N390 Urinary tract infection, site not specified: Secondary | ICD-10-CM | POA: Diagnosis not present

## 2018-08-26 DIAGNOSIS — I5032 Chronic diastolic (congestive) heart failure: Secondary | ICD-10-CM | POA: Insufficient documentation

## 2018-08-26 DIAGNOSIS — F419 Anxiety disorder, unspecified: Secondary | ICD-10-CM | POA: Insufficient documentation

## 2018-08-26 DIAGNOSIS — N183 Chronic kidney disease, stage 3 unspecified: Secondary | ICD-10-CM | POA: Diagnosis present

## 2018-08-26 DIAGNOSIS — I2510 Atherosclerotic heart disease of native coronary artery without angina pectoris: Secondary | ICD-10-CM | POA: Insufficient documentation

## 2018-08-26 DIAGNOSIS — I499 Cardiac arrhythmia, unspecified: Secondary | ICD-10-CM | POA: Diagnosis not present

## 2018-08-26 DIAGNOSIS — I13 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 failure and stage 1 through stage 4 chronic kidney disease, or unspecified chronic kidney disease: Secondary | ICD-10-CM | POA: Insufficient documentation

## 2018-08-26 DIAGNOSIS — L8990 Pressure ulcer of unspecified site, unspecified stage: Secondary | ICD-10-CM

## 2018-08-26 DIAGNOSIS — E785 Hyperlipidemia, unspecified: Secondary | ICD-10-CM | POA: Insufficient documentation

## 2018-08-26 DIAGNOSIS — B9620 Unspecified Escherichia coli [E. coli] as the cause of diseases classified elsewhere: Secondary | ICD-10-CM | POA: Diagnosis not present

## 2018-08-26 DIAGNOSIS — Z881 Allergy status to other antibiotic agents status: Secondary | ICD-10-CM | POA: Insufficient documentation

## 2018-08-26 DIAGNOSIS — L89312 Pressure ulcer of right buttock, stage 2: Secondary | ICD-10-CM | POA: Insufficient documentation

## 2018-08-26 DIAGNOSIS — R2689 Other abnormalities of gait and mobility: Secondary | ICD-10-CM | POA: Diagnosis not present

## 2018-08-26 DIAGNOSIS — Z79899 Other long term (current) drug therapy: Secondary | ICD-10-CM | POA: Insufficient documentation

## 2018-08-26 DIAGNOSIS — N1832 Chronic kidney disease, stage 3b: Secondary | ICD-10-CM | POA: Diagnosis present

## 2018-08-26 DIAGNOSIS — I10 Essential (primary) hypertension: Secondary | ICD-10-CM

## 2018-08-26 DIAGNOSIS — R2681 Unsteadiness on feet: Secondary | ICD-10-CM | POA: Diagnosis not present

## 2018-08-26 DIAGNOSIS — J449 Chronic obstructive pulmonary disease, unspecified: Principal | ICD-10-CM | POA: Diagnosis present

## 2018-08-26 DIAGNOSIS — Z1159 Encounter for screening for other viral diseases: Secondary | ICD-10-CM | POA: Diagnosis not present

## 2018-08-26 DIAGNOSIS — Z515 Encounter for palliative care: Secondary | ICD-10-CM | POA: Insufficient documentation

## 2018-08-26 DIAGNOSIS — Z9889 Other specified postprocedural states: Secondary | ICD-10-CM | POA: Insufficient documentation

## 2018-08-26 DIAGNOSIS — G9341 Metabolic encephalopathy: Secondary | ICD-10-CM | POA: Insufficient documentation

## 2018-08-26 DIAGNOSIS — Z8249 Family history of ischemic heart disease and other diseases of the circulatory system: Secondary | ICD-10-CM | POA: Insufficient documentation

## 2018-08-26 DIAGNOSIS — J40 Bronchitis, not specified as acute or chronic: Secondary | ICD-10-CM | POA: Diagnosis present

## 2018-08-26 DIAGNOSIS — Z9981 Dependence on supplemental oxygen: Secondary | ICD-10-CM | POA: Diagnosis not present

## 2018-08-26 DIAGNOSIS — R0902 Hypoxemia: Secondary | ICD-10-CM | POA: Diagnosis not present

## 2018-08-26 DIAGNOSIS — R482 Apraxia: Secondary | ICD-10-CM | POA: Diagnosis not present

## 2018-08-26 DIAGNOSIS — I480 Paroxysmal atrial fibrillation: Secondary | ICD-10-CM

## 2018-08-26 DIAGNOSIS — R0682 Tachypnea, not elsewhere classified: Secondary | ICD-10-CM | POA: Diagnosis not present

## 2018-08-26 DIAGNOSIS — N1831 Chronic kidney disease, stage 3a: Secondary | ICD-10-CM | POA: Diagnosis present

## 2018-08-26 DIAGNOSIS — N400 Enlarged prostate without lower urinary tract symptoms: Secondary | ICD-10-CM | POA: Diagnosis not present

## 2018-08-26 DIAGNOSIS — M6281 Muscle weakness (generalized): Secondary | ICD-10-CM | POA: Diagnosis not present

## 2018-08-26 DIAGNOSIS — M069 Rheumatoid arthritis, unspecified: Secondary | ICD-10-CM | POA: Diagnosis not present

## 2018-08-26 DIAGNOSIS — R404 Transient alteration of awareness: Secondary | ICD-10-CM | POA: Diagnosis not present

## 2018-08-26 DIAGNOSIS — Z951 Presence of aortocoronary bypass graft: Secondary | ICD-10-CM | POA: Insufficient documentation

## 2018-08-26 DIAGNOSIS — R0603 Acute respiratory distress: Secondary | ICD-10-CM

## 2018-08-26 DIAGNOSIS — I5020 Unspecified systolic (congestive) heart failure: Secondary | ICD-10-CM | POA: Diagnosis present

## 2018-08-26 DIAGNOSIS — C3491 Malignant neoplasm of unspecified part of right bronchus or lung: Secondary | ICD-10-CM | POA: Diagnosis not present

## 2018-08-26 DIAGNOSIS — L89322 Pressure ulcer of left buttock, stage 2: Secondary | ICD-10-CM | POA: Diagnosis not present

## 2018-08-26 DIAGNOSIS — Z66 Do not resuscitate: Secondary | ICD-10-CM | POA: Insufficient documentation

## 2018-08-26 DIAGNOSIS — I69391 Dysphagia following cerebral infarction: Secondary | ICD-10-CM

## 2018-08-26 DIAGNOSIS — I5030 Unspecified diastolic (congestive) heart failure: Secondary | ICD-10-CM | POA: Diagnosis present

## 2018-08-26 DIAGNOSIS — Z87891 Personal history of nicotine dependence: Secondary | ICD-10-CM | POA: Insufficient documentation

## 2018-08-26 DIAGNOSIS — I959 Hypotension, unspecified: Secondary | ICD-10-CM | POA: Diagnosis present

## 2018-08-26 DIAGNOSIS — Z20828 Contact with and (suspected) exposure to other viral communicable diseases: Secondary | ICD-10-CM | POA: Diagnosis not present

## 2018-08-26 DIAGNOSIS — J80 Acute respiratory distress syndrome: Secondary | ICD-10-CM | POA: Diagnosis not present

## 2018-08-26 DIAGNOSIS — Z7982 Long term (current) use of aspirin: Secondary | ICD-10-CM | POA: Insufficient documentation

## 2018-08-26 DIAGNOSIS — Z885 Allergy status to narcotic agent status: Secondary | ICD-10-CM | POA: Insufficient documentation

## 2018-08-26 DIAGNOSIS — Z7901 Long term (current) use of anticoagulants: Secondary | ICD-10-CM | POA: Insufficient documentation

## 2018-08-26 DIAGNOSIS — Z888 Allergy status to other drugs, medicaments and biological substances status: Secondary | ICD-10-CM | POA: Insufficient documentation

## 2018-08-26 DIAGNOSIS — I4821 Permanent atrial fibrillation: Secondary | ICD-10-CM | POA: Diagnosis present

## 2018-08-26 DIAGNOSIS — J9811 Atelectasis: Secondary | ICD-10-CM | POA: Diagnosis not present

## 2018-08-26 DIAGNOSIS — Z91040 Latex allergy status: Secondary | ICD-10-CM | POA: Insufficient documentation

## 2018-08-26 DIAGNOSIS — Z955 Presence of coronary angioplasty implant and graft: Secondary | ICD-10-CM | POA: Insufficient documentation

## 2018-08-26 DIAGNOSIS — R0602 Shortness of breath: Secondary | ICD-10-CM | POA: Diagnosis not present

## 2018-08-26 DIAGNOSIS — L89109 Pressure ulcer of unspecified part of back, unspecified stage: Secondary | ICD-10-CM

## 2018-08-26 DIAGNOSIS — Z96653 Presence of artificial knee joint, bilateral: Secondary | ICD-10-CM | POA: Insufficient documentation

## 2018-08-26 LAB — COMPREHENSIVE METABOLIC PANEL
ALT: 14 U/L (ref 0–44)
AST: 23 U/L (ref 15–41)
Albumin: 2.8 g/dL — ABNORMAL LOW (ref 3.5–5.0)
Alkaline Phosphatase: 117 U/L (ref 38–126)
Anion gap: 15 (ref 5–15)
BUN: 13 mg/dL (ref 8–23)
CO2: 18 mmol/L — ABNORMAL LOW (ref 22–32)
Calcium: 8.9 mg/dL (ref 8.9–10.3)
Chloride: 105 mmol/L (ref 98–111)
Creatinine, Ser: 1.3 mg/dL — ABNORMAL HIGH (ref 0.61–1.24)
GFR calc Af Amer: 56 mL/min — ABNORMAL LOW (ref >60)
GFR calc non Af Amer: 49 mL/min — ABNORMAL LOW (ref >60)
Glucose, Bld: 99 mg/dL (ref 70–99)
Potassium: 3.6 mmol/L (ref 3.5–5.1)
Sodium: 138 mmol/L (ref 135–145)
Total Bilirubin: 0.8 mg/dL (ref 0.3–1.2)
Total Protein: 6.3 g/dL — ABNORMAL LOW (ref 6.5–8.1)

## 2018-08-26 LAB — CBC WITH DIFFERENTIAL/PLATELET
Abs Immature Granulocytes: 0.06 10*3/uL (ref 0.00–0.07)
Basophils Absolute: 0 10*3/uL (ref 0.0–0.1)
Basophils Relative: 0 %
Eosinophils Absolute: 0 10*3/uL (ref 0.0–0.5)
Eosinophils Relative: 0 %
HCT: 35.2 % — ABNORMAL LOW (ref 39.0–52.0)
Hemoglobin: 11.2 g/dL — ABNORMAL LOW (ref 13.0–17.0)
Immature Granulocytes: 1 %
Lymphocytes Relative: 12 %
Lymphs Abs: 1.1 10*3/uL (ref 0.7–4.0)
MCH: 30.1 pg (ref 26.0–34.0)
MCHC: 31.8 g/dL (ref 30.0–36.0)
MCV: 94.6 fL (ref 80.0–100.0)
Monocytes Absolute: 1 10*3/uL (ref 0.1–1.0)
Monocytes Relative: 10 %
Neutro Abs: 7.3 10*3/uL (ref 1.7–7.7)
Neutrophils Relative %: 77 %
Platelets: 153 10*3/uL (ref 150–400)
RBC: 3.72 MIL/uL — ABNORMAL LOW (ref 4.22–5.81)
RDW: 13.9 % (ref 11.5–15.5)
WBC: 9.5 10*3/uL (ref 4.0–10.5)
nRBC: 0 % (ref 0.0–0.2)

## 2018-08-26 LAB — TROPONIN I: Troponin I: <0.03 ng/mL (ref <0.03)

## 2018-08-26 LAB — BRAIN NATRIURETIC PEPTIDE: B Natriuretic Peptide: 267.7 pg/mL — ABNORMAL HIGH (ref 0.0–100.0)

## 2018-08-26 LAB — SARS CORONAVIRUS 2 BY RT PCR (HOSPITAL ORDER, PERFORMED IN ~~LOC~~ HOSPITAL LAB): SARS Coronavirus 2: NEGATIVE

## 2018-08-26 LAB — D-DIMER, QUANTITATIVE: D-Dimer, Quant: 1.3 ug/mL-FEU — ABNORMAL HIGH (ref 0.00–0.50)

## 2018-08-26 MED ORDER — ROSUVASTATIN CALCIUM 5 MG PO TABS
5.0000 mg | ORAL_TABLET | Freq: Every day | ORAL | Status: DC
  Administered 2018-08-26: 5 mg via ORAL
  Filled 2018-08-26: qty 1

## 2018-08-26 MED ORDER — ALBUTEROL SULFATE HFA 108 (90 BASE) MCG/ACT IN AERS: 2.0000 | INHALATION_SPRAY | Freq: Once | RESPIRATORY_TRACT | Status: DC | PRN

## 2018-08-26 MED ORDER — ENOXAPARIN SODIUM 40 MG/0.4ML ~~LOC~~ SOLN: 40.0000 mg | SUBCUTANEOUS | Status: DC

## 2018-08-26 MED ORDER — PREDNISONE 20 MG PO TABS
20.0000 mg | ORAL_TABLET | Freq: Every day | ORAL | Status: DC
  Administered 2018-08-26 – 2018-08-27 (×2): 20 mg via ORAL
  Filled 2018-08-26 (×2): qty 1

## 2018-08-26 MED ORDER — ALBUTEROL SULFATE (2.5 MG/3ML) 0.083% IN NEBU: 2.5000 mg | INHALATION_SOLUTION | RESPIRATORY_TRACT | Status: DC | PRN

## 2018-08-26 MED ORDER — ASPIRIN EC 81 MG PO TBEC
81.0000 mg | DELAYED_RELEASE_TABLET | Freq: Every day | ORAL | Status: DC
  Administered 2018-08-26: 81 mg via ORAL
  Filled 2018-08-26: qty 1

## 2018-08-26 MED ORDER — ALBUTEROL SULFATE HFA 108 (90 BASE) MCG/ACT IN AERS
6.0000 | INHALATION_SPRAY | Freq: Once | RESPIRATORY_TRACT | Status: AC
  Administered 2018-08-26: 6 via RESPIRATORY_TRACT
  Filled 2018-08-26: qty 6.7

## 2018-08-26 MED ORDER — ORAL CARE MOUTH RINSE
15.0000 mL | Freq: Two times a day (BID) | OROMUCOSAL | Status: DC
  Administered 2018-08-26 – 2018-08-27 (×2): 15 mL via OROMUCOSAL

## 2018-08-26 MED ORDER — IPRATROPIUM-ALBUTEROL 0.5-2.5 (3) MG/3ML IN SOLN
3.0000 mL | Freq: Four times a day (QID) | RESPIRATORY_TRACT | Status: DC
  Administered 2018-08-26 – 2018-08-27 (×3): 3 mL via RESPIRATORY_TRACT
  Filled 2018-08-26 (×3): qty 3

## 2018-08-26 MED ORDER — POLYETHYLENE GLYCOL 3350 17 G PO PACK: 17.0000 g | PACK | Freq: Every day | ORAL | Status: DC | PRN

## 2018-08-26 MED ORDER — METOPROLOL TARTRATE 12.5 MG HALF TABLET
12.5000 mg | ORAL_TABLET | Freq: Two times a day (BID) | ORAL | Status: DC
  Administered 2018-08-26 – 2018-08-27 (×2): 12.5 mg via ORAL
  Filled 2018-08-26 (×2): qty 1

## 2018-08-26 MED ORDER — PANTOPRAZOLE SODIUM 40 MG PO TBEC
40.0000 mg | DELAYED_RELEASE_TABLET | ORAL | Status: DC
  Administered 2018-08-27: 40 mg via ORAL
  Filled 2018-08-26: qty 1

## 2018-08-26 MED ORDER — TAMSULOSIN HCL 0.4 MG PO CAPS
0.4000 mg | ORAL_CAPSULE | Freq: Every day | ORAL | Status: DC
  Administered 2018-08-26: 0.4 mg via ORAL
  Filled 2018-08-26: qty 1

## 2018-08-26 MED ORDER — CEPHALEXIN 500 MG PO CAPS
500.0000 mg | ORAL_CAPSULE | Freq: Two times a day (BID) | ORAL | Status: DC
  Administered 2018-08-26 – 2018-08-27 (×2): 500 mg via ORAL
  Filled 2018-08-26 (×2): qty 1

## 2018-08-26 MED ORDER — IOHEXOL 350 MG/ML SOLN
80.0000 mL | Freq: Once | INTRAVENOUS | Status: AC | PRN
  Administered 2018-08-26: 15:00:00 80 mL via INTRAVENOUS

## 2018-08-26 NOTE — ED Provider Notes (Signed)
Emergency Department Provider Note   I have reviewed the triage vital signs and the nursing notes.   HISTORY  Chief Complaint Shortness of Breath   HPI Evan [name] is a 85 y.o. male with PMH of adenocarcinoma of the right lung, CAD, BPH, left thalamic hemorrhage, h/o PE but no longer on anticoagulation presents from home with acute onset shortness of breath.  Daughter reports significant increase in respiratory rate which she measured at 60 breaths/min.  She gave a home nebulizer treatment and continued his home O2.  Patient was started on oxygen during his last admission.  That admission was for altered mental status and thought to be secondary to urinary tract infection.  Patient denies any chest pain.  He denies any current shortness of breath.  EMS transported the patient on nonrebreather and he was transitioned to 4 L nasal cannula on arrival to the emergency department.  Patient denies any fever, chills, abdominal pain.  Past Medical History:  Diagnosis Date  . Adenocarcinoma of right lung ([place]) 01/06/2016  . Anginal chest pain at rest [facility])    Chronic non, controlled on Lorazepam  . Anxiety   . Arthritis    "all over"   . BPH (benign prostatic hyperplasia)   . CAD (coronary artery disease)   . Chronic lower back pain   . Complication of anesthesia    "problems making water afterwards"  . Depression   . DJD (degenerative joint disease)   . Esophageal ulcer without bleeding    bid ppi indefinitely  . Family history of adverse reaction to anesthesia    "children w/PONV"  . GERD (gastroesophageal reflux disease)   . Headache    "couple/week maybe" (09/04/2015)  . Hemochromatosis    Possible, elevated iron stores, hook-like osteophytes on hand films, normal LFTs  . Hepatitis    "yellow jaundice as a baby"  . History of ASCVD    MULTIVESSEL  . Hyperlipidemia   . Hypertension   . Osteoarthritis   . Pneumonia    "several times; got a little now" (09/04/2015)  .  Rheumatoid arthritis ([place])   . Subdural hematoma ([facility])    small after fall 09/2016-plavix held, neurosurgery consulted.  Asymptomatic.    Marland Kitchen 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[place]) 02/2018   on Lovenox lifelong since he failed PO Eliquis    Patient Active Problem List   Diagnosis Date Noted  . Pressure injury of skin 08/27/2018  . COPD (chronic obstructive pulmonary disease) ([place]) 08/26/2018  . AMS (altered mental status) 08/22/2018  . Altered mental status 08/21/2018  . UTI (urinary tract infection) 08/21/2018  . Abnormality of gait 08/20/2018  . Prolonged Q-T interval on ECG   . Dysphagia, post-stroke   . Supplemental oxygen dependent   . Anxiety state   . Fatigue   . SOB (shortness of breath)   . Hyperglycemia   . History of lung cancer   . Depression   . CKD (chronic kidney disease), stage III ([place])   . Acute blood loss anemia   . Fall 07/16/2018  . 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 Thalamic hemorrhage ([place]) 07/16/2018  . Acute spontaneous 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 ([place]) L thalamic 07/14/2018  . Cellulitis of arm, right 06/01/2018  . Rash and nonspecific skin eruption 06/01/2018  . Chest pain 02/02/2018  . Thrombocytopenia ([place]) 02/02/2018  . Hypokalemia 02/02/2018  . Pulmonary embolism ([place]) 02/02/2018  . Elevated troponin 02/02/2018  . Pulmonary embolus ([place]) 02/02/2018  . Non-ST elevation MI (NSTEMI) (  [place]) 06/27/2017  . Diastolic CHF ([place]) 81/19/1478  . Chest pain due to CAD ([place]) 06/26/2017  . Dyspnea 06/21/2017  . Acute bronchitis 05/15/2017  . Oral candidiasis 05/15/2017  . Acute pericarditis   . Chest pain at rest 12/20/2016  . Angina pectoris ([place]) 12/20/2016  . Radiation pneumonitis ([place]) 11/09/2016  . Pleurisy 10/11/2016  . Chest wall pain 10/11/2016  . Atrial fibrillation ([place]) 05/21/2016  . Adenocarcinoma of right lung ([place]) 01/06/2016  . GAD (generalized anxiety disorder) 11/20/2015  . Essential hypertension   .  Cellulitis 09/04/2015  . Cellulitis of left axilla   . Esophageal ulcer without bleeding   . Bilateral lower extremity edema 04/28/2014  . BPH (benign prostatic hyperplasia) 10/08/2012  . Anxiety   . EKG abnormalities 09/05/2012  . Tremor 09/05/2012  . Chronic fatigue 09/05/2012  . Arthritis   . Asthma, chronic   . Reflux esophagitis   . S/P CABG (coronary artery bypass graft)   . GERD (gastroesophageal reflux disease)   . Hyperlipidemia   . Hypertension   . UNSPECIFIED PERIPHERAL VASCULAR DISEASE 06/16/2009    Past Surgical History:  Procedure Laterality Date  . ARM SKIN LESION BIOPSY / EXCISION Left 09/02/2015  . CATARACT EXTRACTION W/ INTRAOCULAR LENS  IMPLANT, BILATERAL Bilateral   . CHOLECYSTECTOMY OPEN    . CORNEAL TRANSPLANT Bilateral    "one at [facility]; one at [facility]"  . CORONARY ANGIOPLASTY WITH STENT PLACEMENT    . CORONARY ARTERY BYPASS GRAFT  1999  . DESCENDING AORTIC ANEURYSM REPAIR W/ STENT     DE stent ostium into the right radial free graft at OM-, 02-2007  . ESOPHAGOGASTRODUODENOSCOPY N/A 11/09/2014   Procedure: ESOPHAGOGASTRODUODENOSCOPY (EGD);  Surgeon: [name], MD;  Location: [facility] ENDOSCOPY;  Service: Endoscopy;  Laterality: N/A;  . INGUINAL HERNIA REPAIR    . JOINT REPLACEMENT    . LEFT HEART CATH AND CORS/GRAFTS ANGIOGRAPHY N/A 06/27/2017   Procedure: LEFT HEART CATH AND CORS/GRAFTS ANGIOGRAPHY;  Surgeon: [name], MD;  Location: [facility];  Service: Cardiovascular;  Laterality: N/A;  . NASAL SINUS SURGERY    . SHOULDER OPEN ROTATOR CUFF REPAIR Right   . TOTAL KNEE ARTHROPLASTY Bilateral     Allergies Feldene [piroxicam]; Statins; Doxycycline; Latex; Tequin; and Valium  Family History  Problem Relation Age of Onset  . Arthritis-Osteo Sister   . Heart attack Brother   . Heart attack Other   . Cancer Neg Hx     Social History Social History   Tobacco Use  . Smoking status: Never Smoker  . Smokeless tobacco: Former Systems developer    Types:  Chew  . Tobacco comment: "quit chewing in the 1960s"  Substance Use Topics  . Alcohol use: No  . Drug use: No    Review of Systems  Constitutional: No fever/chills Eyes: No visual changes. ENT: No sore throat. Cardiovascular: Denies chest pain. Respiratory: Positive shortness of breath. Gastrointestinal: No abdominal pain.  No nausea, no vomiting.  No diarrhea.  No constipation. Genitourinary: Negative for dysuria. Musculoskeletal: Negative for back pain. Skin: Negative for rash. Neurological: Negative for headaches, focal weakness or numbness.  10-point ROS otherwise negative.  ____________________________________________   PHYSICAL EXAM:  VITAL SIGNS: ED Triage Vitals  Enc Vitals Group     BP 08/26/18 0915 (!) 141/72     Pulse Rate 08/26/18 0915 89     Resp 08/26/18 0915 16     Temp 08/26/18 0915 (!) 97.2 F (36.2 C)  Temp Source 08/26/18 0915 Axillary     SpO2 08/26/18 0915 100 %     Weight 08/26/18 0923 190 lb (86.2 kg)     Height 08/26/18 0923 5\' 5"  (1.651 m)   Constitutional: Alert and oriented. Well appearing and in no acute distress. Eyes: Conjunctivae are normal.  Head: Atraumatic. Nose: No congestion/rhinnorhea. Mouth/Throat: Mucous membranes are moist.  Neck: No stridor.   Cardiovascular: Normal rate, regular rhythm. Good peripheral circulation. Grossly normal heart sounds.   Respiratory: Increased respiratory effort.  No retractions. Lungs with good air movement and no significant wheezing or rales.  Gastrointestinal: Soft and nontender. No distention.  Musculoskeletal: No lower extremity tenderness nor edema. No gross deformities of extremities. Neurologic:  Normal speech and language. No gross focal neurologic deficits are appreciated.  Skin:  Skin is warm, dry and intact. No rash noted.  ____________________________________________   LABS (all labs ordered are listed, but only abnormal results are displayed)  Labs Reviewed  COMPREHENSIVE  METABOLIC PANEL - Abnormal; Notable for the following components:      Result Value   CO2 18 (*)    Creatinine, Ser 1.30 (*)    Total Protein 6.3 (*)    Albumin 2.8 (*)    GFR calc non Af Amer 49 (*)    GFR calc Af Amer 56 (*)    All other components within normal limits  BRAIN NATRIURETIC PEPTIDE - Abnormal; Notable for the following components:   B Natriuretic Peptide 267.7 (*)    All other components within normal limits  CBC WITH DIFFERENTIAL/PLATELET - Abnormal; Notable for the following components:   RBC 3.72 (*)    Hemoglobin 11.2 (*)    HCT 35.2 (*)    All other components within normal limits  D-DIMER, QUANTITATIVE (NOT AT [facility]) - Abnormal; Notable for the following components:   D-Dimer, Quant 1.30 (*)    All other components within normal limits  BASIC METABOLIC PANEL - Abnormal; Notable for the following components:   CO2 20 (*)    Glucose, Bld 157 (*)    Calcium 8.7 (*)    GFR calc non Af Amer 55 (*)    All other components within normal limits  CBC - Abnormal; Notable for the following components:   RBC 3.41 (*)    Hemoglobin 10.1 (*)    HCT 31.3 (*)    Platelets 138 (*)    All other components within normal limits  SARS CORONAVIRUS 2 (HOSPITAL ORDER, [facility])  TROPONIN I   ____________________________________________  EKG   EKG Interpretation  Date/Time:  Sunday Aug 26 2018 11:24:49 EDT Ventricular Rate:  101 PR Interval:    QRS Duration: 96 QT Interval:  355 QTC Calculation: 461 R Axis:   -10 Text Interpretation:  Sinus or ectopic atrial tachycardia Abnormal R-wave progression, early transition LVH with secondary repolarization abnormality rate is faster, otherwise no significant change since May 2020 Confirmed by Sherwood Gambler [phone]) on 08/26/2018 3:42:17 PM       ____________________________________________  RADIOLOGY  Ct Angio Chest Pe W And/or Wo Contrast  Result Date: 08/26/2018 CLINICAL DATA:  Shortness  of breath with tachypnea. History of congestive heart failure and atrial fibrillation. History of right lower lobe lung cancer. EXAM: CT ANGIOGRAPHY CHEST WITH CONTRAST TECHNIQUE: Multidetector CT imaging of the chest was performed using the standard protocol during bolus administration of intravenous contrast. Multiplanar CT image reconstructions and MIPs were obtained to evaluate the vascular anatomy. CONTRAST:  63mL  OMNIPAQUE IOHEXOL 350 MG/ML SOLN COMPARISON:  Radiograph same date.  CT 07/21/2018 and 06/01/2018. FINDINGS: Cardiovascular: The pulmonary arteries are well opacified with contrast to the level of the subsegmental branches. There is no evidence of acute pulmonary embolism. There is diffuse atherosclerosis of the aorta, great vessels and coronary arteries status post median sternotomy and CABG. There is limited opacification of the systemic arteries. Stable mild cardiomegaly without pericardial effusion. There are calcifications of the aortic valve. Mediastinum/Nodes: Mildly enlarged right paratracheal, subcarinal and inferior right hilar lymph nodes are similar to the most recent study. No progressive adenopathy. The thyroid gland, trachea and esophagus demonstrate no significant findings. Lungs/Pleura: There is a stable small right pleural effusion. Underlying moderate emphysematous changes are present with diffuse central airway thickening. The dependent airspace disease in the right lower lobe has not significantly changed from the previous study. No new airspace opacities or focal pulmonary nodules. Upper abdomen: No acute findings. There is mild reflux of contrast into the IVC and hepatic veins. There is mild contour irregularity of the liver suggesting possible cirrhosis. Musculoskeletal/Chest wall: There is no chest wall mass or suspicious osseous finding. Review of the MIP images confirms the above findings. IMPRESSION: 1. No evidence of acute pulmonary embolism or other acute chest process.  2. Right pleural effusion and right basilar airspace disease have not significantly changed from the recent prior study. However, there is increased right infrahilar nodularity compared with older prior studies, and local recurrence lung cancer remains a concern. 3. Aortic Atherosclerosis (ICD10-I70.0) and Emphysema (ICD10-J43.9). Electronically Signed   By: Richardean Sale M.D.   On: 08/26/2018 15:10   Dg Chest Portable 1 View  Result Date: 08/26/2018 CLINICAL DATA:  Shortness of breath EXAM: PORTABLE CHEST 1 VIEW COMPARISON:  Aug 23, 2018 FINDINGS: There is slight bibasilar atelectasis. There is no edema or consolidation. Heart is mildly enlarged with pulmonary vascularity normal. Patient is status post internal mammary bypass grafting. I there is aortic atherosclerosis. No adenopathy. Bones are osteoporotic. There is degenerative change in each shoulder. IMPRESSION: No edema or consolidation. Slight bibasilar atelectasis. Stable cardiac prominence. Postoperative changes noted. Aortic Atherosclerosis (ICD10-I70.0). Electronically Signed   By: Lowella Grip III M.D.   On: 08/26/2018 10:45    ____________________________________________   PROCEDURES  Procedure(s) performed:   Procedures  CRITICAL CARE Performed by: Margette Fast Total critical care time: 35 minutes Critical care time was exclusive of separately billable procedures and treating other patients. Critical care was necessary to treat or prevent imminent or life-threatening deterioration. Critical care was time spent personally by me on the following activities: development of treatment plan with patient and/or surrogate as well as nursing, discussions with consultants, evaluation of patient's response to treatment, examination of patient, obtaining history from patient or surrogate, ordering and performing treatments and interventions, ordering and review of laboratory studies, ordering and review of radiographic studies, pulse  oximetry and re-evaluation of patient's condition.  Nanda Quinton, MD Emergency Medicine  ____________________________________________   INITIAL IMPRESSION / ASSESSMENT AND PLAN / ED COURSE  Pertinent labs & imaging results that were available during my care of the patient were reviewed by me and considered in my medical decision making (see chart for details).   Presents to the emergency department with shortness of breath and hypoxemia.  He was sent home with supplemental O2 during his recent admission.  No significant wheezing on exam.  He does have some increased work of breathing but is able to provide a history and speak  and near complete sentences without taking a break for breath.  Plan for screening labs.  SARS-CoV-2 is a consideration given his recent hospitalization as well as PE.  He is not anticoagulated due to history of thalamic hemorrhage. Attempted to reach daughter by phone without success.   CXR and labs reviewed. No significant change. CTA pending. Patient with continued tachypnea and increased O2 requirement.  ____________________________________________  FINAL CLINICAL IMPRESSION(S) / ED DIAGNOSES  Final diagnoses:  Acute respiratory distress  Hypoxia     MEDICATIONS GIVEN DURING THIS VISIT:  Medications  aspirin EC tablet 81 mg (81 mg Oral Given 08/26/18 2138)  cephALEXin (KEFLEX) capsule 500 mg (500 mg Oral Given 08/26/18 2138)  metoprolol tartrate (LOPRESSOR) tablet 12.5 mg (12.5 mg Oral Given 08/26/18 2138)  rosuvastatin (CRESTOR) tablet 5 mg (5 mg Oral Given 08/26/18 2138)  pantoprazole (PROTONIX) EC tablet 40 mg (has no administration in time range)  polyethylene glycol (MIRALAX / GLYCOLAX) packet 17 g (has no administration in time range)  tamsulosin (FLOMAX) capsule 0.4 mg (0.4 mg Oral Given 08/26/18 2138)  ipratropium-albuterol (DUONEB) 0.5-2.5 (3) MG/3ML nebulizer solution 3 mL (3 mLs Nebulization Given 08/27/18 0136)  albuterol (PROVENTIL) (2.5 MG/3ML)  0.083% nebulizer solution 2.5 mg (has no administration in time range)  predniSONE (DELTASONE) tablet 20 mg (20 mg Oral Given 08/26/18 2138)  MEDLINE mouth rinse (15 mLs Mouth Rinse Given 08/26/18 2140)  albuterol (VENTOLIN HFA) 108 (90 Base) MCG/ACT inhaler 6 puff (6 puffs Inhalation Given 08/26/18 1156)  iohexol (OMNIPAQUE) 350 MG/ML injection 80 mL (80 mLs Intravenous Contrast Given 08/26/18 1454)     Note:  This document was prepared using Dragon voice recognition software and may include unintentional dictation errors.  Nanda Quinton, MD Emergency Medicine    Long, [facility], MD 08/27/18 [phone]

## 2018-08-26 NOTE — ED Provider Notes (Signed)
Care transferred to me.  Patient feels like his breathing is little better but he still tachypneic.  He still has an increased oxygen requirement.  CTA does not show any obvious pathology such as PE or new pneumonia.  I hear a little bit of wheezing on exam so this could be bronchospasm/bronchitis.  Given his work of breathing he will need readmission. D/w Dr. Lorin Mercy.   Sherwood Gambler, MD 08/26/18 [phone]

## 2018-08-26 NOTE — ED Notes (Signed)
Spoke with daughter on the phone

## 2018-08-26 NOTE — ED Notes (Signed)
ED TO INPATIENT HANDOFF REPORT  ED Nurse Name and Phone #: Idonna Heeren [phone]  S Name/Age/Gender Evan [name] 85 y.o. male Room/Bed: 024C/024C  Code Status   Code Status: DNR  Home/SNF/Other Home Patient oriented to: self and place Is this baseline? Yes   Triage Complete: Triage complete  Chief Complaint sob  Triage Note Pt was at home with daughter when he became SOB, EMS reported that he was tachypneic at 60 breaths/min. Daughter gave a neb tx at home & he was given O2 via non-rebreather while in route & was still wearing it upon arival to ED with O2 sat 100%, Pt is A/O to self & place. 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   Allergies Allergies  Allergen Reactions  . Feldene [Piroxicam] Other (See Comments)    REACTION: Blisters  . Statins Other (See Comments)    Myalgias  . Doxycycline Other (See Comments)    REACTION:  unknown  . Latex Rash  . Tequin Anxiety  . Valium Other (See Comments)    REACTION:  unknown    Level of Care/Admitting Diagnosis ED Disposition    ED Disposition Condition [facility]: [place] [100100]  Level of Care: Med-Surg [16]  I expect the patient will be discharged within 24 hours: Yes  LOW acuity---Tx typically complete <24 hrs---ACUTE conditions typically can be evaluated <24 hours---LABS likely to return to acceptable levels <24 hours---IS near functional baseline---EXPECTED to return to current living arrangement---NOT newly hypoxic: Meets criteria for 5C-Observation unit  Covid Evaluation: Screening Protocol (No Symptoms)  Diagnosis: COPD (chronic obstructive pulmonary disease) [facility]) [[id number]]  Admitting Physician: [name] [[id number]]  Attending Physician: [name] [[id number]]  PT Class (Do Not Modify): Observation [104]  PT Acc Code (Do Not Modify): Observation [10022]       B Medical/Surgery History Past Medical History:  Diagnosis Date  . Adenocarcinoma of right lung  ([place]) 01/06/2016  . Anginal chest pain at rest [facility])    Chronic non, controlled on Lorazepam  . Anxiety   . Arthritis    "all over"   . BPH (benign prostatic hyperplasia)   . CAD (coronary artery disease)   . Chronic lower back pain   . Complication of anesthesia    "problems making water afterwards"  . Depression   . DJD (degenerative joint disease)   . Esophageal ulcer without bleeding    bid ppi indefinitely  . Family history of adverse reaction to anesthesia    "children w/PONV"  . GERD (gastroesophageal reflux disease)   . Headache    "couple/week maybe" (09/04/2015)  . Hemochromatosis    Possible, elevated iron stores, hook-like osteophytes on hand films, normal LFTs  . Hepatitis    "yellow jaundice as a baby"  . History of ASCVD    MULTIVESSEL  . Hyperlipidemia   . Hypertension   . Osteoarthritis   . Pneumonia    "several times; got a little now" (09/04/2015)  . Rheumatoid arthritis ([place])   . Subdural hematoma ([facility])    small after fall 09/2016-plavix held, neurosurgery consulted.  Asymptomatic.    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 ARM SKIN LESION BIOPSY / EXCISION Left 09/02/2015  . CATARACT EXTRACTION W/ INTRAOCULAR LENS  IMPLANT, BILATERAL Bilateral   . CHOLECYSTECTOMY OPEN    . CORNEAL TRANSPLANT Bilateral    "  one at [facility] [place]; one at [facility]  . CORONARY ANGIOPLASTY WITH STENT PLACEMENT    . CORONARY ARTERY BYPASS GRAFT  1999  . DESCENDING AORTIC ANEURYSM REPAIR W/ STENT     DE stent ostium into the right radial free graft at OM-, 02-2007  . ESOPHAGOGASTRODUODENOSCOPY N/A 11/09/2014   Procedure: ESOPHAGOGASTRODUODENOSCOPY (EGD);  Surgeon: [name], MD;  Location: [facility] ENDOSCOPY;  Service: Endoscopy;  Laterality: N/A;  . INGUINAL HERNIA REPAIR    . JOINT REPLACEMENT    . LEFT HEART CATH AND CORS/GRAFTS ANGIOGRAPHY N/A 06/27/2017   Procedure: LEFT HEART CATH AND  CORS/GRAFTS ANGIOGRAPHY;  Surgeon: [name], MD;  Location: [facility] [facility] LAB;  Service: Cardiovascular;  Laterality: N/A;  . NASAL SINUS SURGERY    . SHOULDER OPEN ROTATOR CUFF REPAIR Right   . TOTAL KNEE ARTHROPLASTY Bilateral      A IV Location/Drains/Wounds Patient Lines/Drains/Airways Status   Active Line/Drains/Airways    Name:   Placement date:   Placement time:   Site:   Days:   Peripheral IV 08/26/18 Left Wrist   08/26/18    0933    Wrist   less than 1   Peripheral IV 08/26/18 Right Wrist   08/26/18    0934    Wrist   less than 1   Peripheral IV 08/26/18 Right Antecubital   08/26/18    1335    Antecubital   less than 1   Wound / Incision (Open or Dehisced) 08/12/18 Other (Comment) Heel Posterior;Right Deep Tissue Injurry   08/12/18    0930    Heel   14   Wound / Incision (Open or Dehisced) 08/12/18 Other (Comment) Ear Right Device Associated Pressure Ulcer    08/12/18    1640    Ear   14          Intake/Output Last 24 hours No intake or output data in the 24 hours ending 08/26/18 1854  Labs/Imaging Results for orders placed or performed during the hospital encounter of 08/26/18 (from the past 48 hour(s))  SARS Coronavirus 2 (CEPHEID- Performed in [facility] hospital lab), Hosp Order     Status: None   Collection Time: 08/26/18  9:54 AM  Result Value Ref Range   SARS Coronavirus 2 NEGATIVE NEGATIVE    Comment: (NOTE) If result is NEGATIVE SARS-CoV-2 target nucleic acids are NOT DETECTED. The SARS-CoV-2 RNA is generally detectable in upper and lower  respiratory specimens during the acute phase of infection. The lowest  concentration of SARS-CoV-2 viral copies this assay can detect is 250  copies / mL. A negative result does not preclude SARS-CoV-2 infection  and should not be used as the sole basis for treatment or other  patient management decisions.  A negative result may occur with  improper specimen collection / handling, submission of specimen other  than  nasopharyngeal swab, presence of viral mutation(s) within the  areas targeted by this assay, and inadequate number of viral copies  (<250 copies / mL). A negative result must be combined with clinical  observations, patient history, and epidemiological information. If result is POSITIVE SARS-CoV-2 target nucleic acids are DETECTED. The SARS-CoV-2 RNA is generally detectable in upper and lower  respiratory specimens dur ing the acute phase of infection.  Positive  results are indicative of active infection with SARS-CoV-2.  Clinical  correlation with patient history and other diagnostic information is  necessary to determine patient infection status.  Positive results do  not rule out bacterial  infection or co-infection with other viruses. If result is PRESUMPTIVE POSTIVE SARS-CoV-2 nucleic acids MAY BE PRESENT.   A presumptive positive result was obtained on the submitted specimen  and confirmed on repeat testing.  While 2019 novel coronavirus  (SARS-CoV-2) nucleic acids may be present in the submitted sample  additional confirmatory testing may be necessary for epidemiological  and / or clinical management purposes  to differentiate between  SARS-CoV-2 and other Sarbecovirus currently known to infect humans.  If clinically indicated additional testing with an alternate test  methodology [phone]) is advised. The SARS-CoV-2 RNA is generally  detectable in upper and lower respiratory sp ecimens during the acute  phase of infection. The expected result is Negative. Fact Sheet for Patients:  StrictlyIdeas.no Fact Sheet for Healthcare Providers: BankingDealers.co.za This test is not yet approved or cleared by the [place] FDA and has been authorized for detection and/or diagnosis of SARS-CoV-2 by FDA under an Emergency Use Authorization (EUA).  This EUA will remain in effect (meaning this test can be used) for the duration of  the COVID-19 declaration under Section 564(b)(1) of the Act, 21 U.S.C. section 360bbb-3(b)(1), unless the authorization is terminated or revoked sooner. Performed at [facility], [place] [address], [place], [place] [address]   Comprehensive metabolic panel     Status: Abnormal   Collection Time: 08/26/18 11:31 AM  Result Value Ref Range   Sodium 138 135 - 145 mmol/L   Potassium 3.6 3.5 - 5.1 mmol/L   Chloride 105 98 - 111 mmol/L   CO2 18 (L) 22 - 32 mmol/L   Glucose, Bld 99 70 - 99 mg/dL   BUN 13 8 - 23 mg/dL   Creatinine, Ser 1.30 (H) 0.61 - 1.24 mg/dL   Calcium 8.9 8.9 - 10.3 mg/dL   Total Protein 6.3 (L) 6.5 - 8.1 g/dL   Albumin 2.8 (L) 3.5 - 5.0 g/dL   AST 23 15 - 41 U/L   ALT 14 0 - 44 U/L   Alkaline Phosphatase 117 38 - 126 U/L   Total Bilirubin 0.8 0.3 - 1.2 mg/dL   GFR calc non Af Amer 49 (L) >60 mL/min   GFR calc Af Amer 56 (L) >60 mL/min   Anion gap 15 5 - 15    Comment: Performed at [facility] [address] [place], [place] [address]  Brain natriuretic peptide     Status: Abnormal   Collection Time: 08/26/18 11:31 AM  Result Value Ref Range   B Natriuretic Peptide 267.7 (H) 0.0 - 100.0 pg/mL    Comment: Performed at [facility] [address] [place],  [address]  Troponin I - Once     Status: None   Collection Time: 08/26/18 11:31 AM  Result Value Ref Range   Troponin I <0.03 <0.03 ng/mL    Comment: Performed at [facility] [address] [place], [place] [address]  CBC with Differential     Status: Abnormal   Collection Time: 08/26/18 11:31 AM  Result Value Ref Range   WBC 9.5 4.0 - 10.5 K/uL   RBC 3.72 (L) 4.22 - 5.81 MIL/uL   Hemoglobin 11.2 (L) 13.0 - 17.0 g/dL   HCT 35.2 (L) 39.0 - 52.0 %   MCV 94.6 80.0 - 100.0 fL   MCH 30.1 26.0 - 34.0 pg   MCHC 31.8 30.0 - 36.0 g/dL   RDW 13.9 11.5 - 15.5 %   Platelets 153 150 - 400  K/uL   nRBC 0.0 0.0 - 0.2 %   Neutrophils Relative % 77 %   Neutro Abs 7.3 1.7 - 7.7 K/uL    Lymphocytes Relative 12 %   Lymphs Abs 1.1 0.7 - 4.0 K/uL   Monocytes Relative 10 %   Monocytes Absolute 1.0 0.1 - 1.0 K/uL   Eosinophils Relative 0 %   Eosinophils Absolute 0.0 0.0 - 0.5 K/uL   Basophils Relative 0 %   Basophils Absolute 0.0 0.0 - 0.1 K/uL   Immature Granulocytes 1 %   Abs Immature Granulocytes 0.06 0.00 - 0.07 K/uL    Comment: Performed at [facility] [address] [place],  [address]  D-dimer, quantitative     Status: Abnormal   Collection Time: 08/26/18 11:31 AM  Result Value Ref Range   D-Dimer, Quant 1.30 (H) 0.00 - 0.50 ug/mL-FEU    Comment: (NOTE) At the manufacturer cut-off of 0.50 ug/mL FEU, this assay has been documented to exclude PE with a sensitivity and negative predictive value of 97 to 99%.  At this time, this assay has not been approved by the FDA to exclude DVT/VTE. Results should be correlated with clinical presentation. Performed at [facility], [place] [address] [place], [place] [address]    Ct Angio Chest Pe W And/or Wo Contrast  Result Date: 08/26/2018 CLINICAL DATA:  Shortness of breath with tachypnea. History of congestive heart failure and atrial fibrillation. History of right lower lobe lung cancer. EXAM: CT ANGIOGRAPHY CHEST WITH CONTRAST TECHNIQUE: Multidetector CT imaging of the chest was performed using the standard protocol during bolus administration of intravenous contrast. Multiplanar CT image reconstructions and MIPs were obtained to evaluate the vascular anatomy. CONTRAST:  15mL OMNIPAQUE IOHEXOL 350 MG/ML SOLN COMPARISON:  Radiograph same date.  CT 07/21/2018 and 06/01/2018. FINDINGS: Cardiovascular: The pulmonary arteries are well opacified with contrast to the level of the subsegmental branches. There is no evidence of acute pulmonary embolism. There is diffuse atherosclerosis of the aorta, great vessels and coronary arteries status post median sternotomy and CABG. There is limited opacification of the systemic  arteries. Stable mild cardiomegaly without pericardial effusion. There are calcifications of the aortic valve. Mediastinum/Nodes: Mildly enlarged right paratracheal, subcarinal and inferior right hilar lymph nodes are similar to the most recent study. No progressive adenopathy. The thyroid gland, trachea and esophagus demonstrate no significant findings. Lungs/Pleura: There is a stable small right pleural effusion. Underlying moderate emphysematous changes are present with diffuse central airway thickening. The dependent airspace disease in the right lower lobe has not significantly changed from the previous study. No new airspace opacities or focal pulmonary nodules. Upper abdomen: No acute findings. There is mild reflux of contrast into the IVC and hepatic veins. There is mild contour irregularity of the liver suggesting possible cirrhosis. Musculoskeletal/Chest wall: There is no chest wall mass or suspicious osseous finding. Review of the MIP images confirms the above findings. IMPRESSION: 1. No evidence of acute pulmonary embolism or other acute chest process. 2. Right pleural effusion and right basilar airspace disease have not significantly changed from the recent prior study. However, there is increased right infrahilar nodularity compared with older prior studies, and local recurrence lung cancer remains a concern. 3. Aortic Atherosclerosis (ICD10-I70.0) and Emphysema (ICD10-J43.9). Electronically Signed   By: Richardean Sale M.D.   On: 08/26/2018 15:10   Dg Chest Portable 1 View  Result Date: 08/26/2018 CLINICAL DATA:  Shortness of breath EXAM: PORTABLE CHEST 1 VIEW COMPARISON:  Aug 23, 2018 FINDINGS: There is slight bibasilar atelectasis. There is no edema or consolidation. Heart is mildly enlarged with pulmonary vascularity normal. Patient is status post internal mammary bypass grafting. I there is aortic atherosclerosis. No adenopathy. Bones are osteoporotic. There is degenerative change in each  shoulder. IMPRESSION: No edema or consolidation. Slight bibasilar atelectasis. Stable cardiac prominence. Postoperative changes noted. Aortic Atherosclerosis (ICD10-I70.0). Electronically Signed   By: Lowella Grip III M.D.   On: 08/26/2018 10:45    Pending Labs Unresulted Labs (From admission, onward)    Start     Ordered   08/27/18 1287  Basic metabolic panel  Tomorrow morning,   R     08/26/18 1834   08/27/18 0500  CBC  Tomorrow morning,   R     08/26/18 1834          Vitals/Pain Today's Vitals   08/26/18 1300 08/26/18 1645 08/26/18 1700 08/26/18 1715  BP: 121/63 105/75 109/70 (!) 120/58  Pulse: (!) 104 94 92 100  Resp: (!) 26 (!) 21 (!) 22 18  Temp:      TempSrc:      SpO2: 99% 99% 98% 99%  Weight:      Height:      PainSc:        Isolation Precautions No active isolations  Medications Medications  aspirin EC tablet 81 mg (has no administration in time range)  cephALEXin (KEFLEX) capsule 500 mg (has no administration in time range)  metoprolol tartrate (LOPRESSOR) tablet 12.5 mg (has no administration in time range)  rosuvastatin (CRESTOR) tablet 5 mg (has no administration in time range)  pantoprazole (PROTONIX) EC tablet 40 mg (has no administration in time range)  polyethylene glycol (MIRALAX / GLYCOLAX) packet 17 g (has no administration in time range)  tamsulosin (FLOMAX) capsule 0.4 mg (has no administration in time range)  ipratropium-albuterol (DUONEB) 0.5-2.5 (3) MG/3ML nebulizer solution 3 mL (has no administration in time range)  albuterol (PROVENTIL) (2.5 MG/3ML) 0.083% nebulizer solution 2.5 mg (has no administration in time range)  predniSONE (DELTASONE) tablet 20 mg (has no administration in time range)  albuterol (VENTOLIN HFA) 108 (90 Base) MCG/ACT inhaler 6 puff (6 puffs Inhalation Given 08/26/18 1156)  iohexol (OMNIPAQUE) 350 MG/ML injection 80 mL (80 mLs Intravenous Contrast Given 08/26/18 1454)    Mobility manual wheelchair Low fall risk    Focused Assessments    R Recommendations: See Admitting Provider Note  Report given to:   Additional Notes:

## 2018-08-26 NOTE — ED Notes (Signed)
Pt did not tol switching to nasal cannula very well, he should no clinical distress & ststed his breathing was "not bad." However, his O2 sat was 78% on 6L n/c, EDP aware NRB back on.

## 2018-08-26 NOTE — H&P (Signed)
History and Physical    Evan [name] DOB: [birth year] DOA: 08/26/2018  PCP: [name], MD Consultants:  Posey Pronto - PM&R; Irish Lack Patient coming from:  Home; NOK: Daughter, Andreas Blower, [phone]  Chief Complaint: SOB  HPI: Evan [name] is a 85 y.o. 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  He reports feeling pretty well now, although he was quite SOB earlier today.  He has a tobacco history and worked in a Research officer, trade union.  He was admitted from 5/19-22 for acute metabolic encephalopathy attributed to E coli UTI; hypoxia, discharged on home O2; recent L thalamic intracranial hemorrhage, now off AC.  Patient was recommended to go to SNF but family refused.  Palliative care was involved and there was discussion among the siblings about monitoring how he does and possibly transitioning to hospice.  ED Course:  SOB after d/c 2 days ago.  Increased WOB and increased O2 requirement initially.  Better with Albuterol.  CTA unremarkable.  Wheezing now with ongoing tachypnea.    Review of Systems: As per HPI; otherwise review of systems reviewed and negative.     Past Medical History:  Diagnosis Date  . Adenocarcinoma of right lung ([place]) 01/06/2016  . Anginal chest pain at rest [facility])    Chronic non, controlled on Lorazepam  . Anxiety   . Arthritis    "all over"   . BPH (benign prostatic hyperplasia)   . CAD (coronary artery disease)   . Chronic lower back pain   . Complication of anesthesia    "problems making water afterwards"  . Depression   . DJD (degenerative joint disease)   . Esophageal ulcer without bleeding    bid ppi indefinitely  . Family history of adverse reaction to anesthesia    "children w/PONV"  . GERD (gastroesophageal reflux disease)   . Headache    "couple/week  maybe" (09/04/2015)  . Hemochromatosis    Possible, elevated iron stores, hook-like osteophytes on hand films, normal LFTs  . Hepatitis    "yellow jaundice as a baby"  . History of ASCVD    MULTIVESSEL  . Hyperlipidemia   . Hypertension   . Osteoarthritis   . Pneumonia    "several times; got a little now" (09/04/2015)  . Rheumatoid arthritis ([place])   . Subdural hematoma (HCC)    small after fall 09/2016-plavix held, neurosurgery consulted.  Asymptomatic.    Marland Kitchen 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[place]) 02/2018   on Lovenox lifelong since he failed PO Eliquis    Past Surgical History:  Procedure Laterality Date  . ARM SKIN LESION BIOPSY / EXCISION Left 09/02/2015  . CATARACT EXTRACTION W/ INTRAOCULAR LENS  IMPLANT, BILATERAL Bilateral   . CHOLECYSTECTOMY OPEN    . CORNEAL TRANSPLANT Bilateral    "one at [facility]; one at [facility]"  . CORONARY ANGIOPLASTY WITH STENT PLACEMENT    . CORONARY ARTERY BYPASS GRAFT  1999  . DESCENDING AORTIC ANEURYSM REPAIR W/ STENT     DE stent ostium into the right radial free graft at OM-, 02-2007  . ESOPHAGOGASTRODUODENOSCOPY N/A 11/09/2014   Procedure: ESOPHAGOGASTRODUODENOSCOPY (EGD);  Surgeon: [name], MD;  Location: [facility] ENDOSCOPY;  Service: Endoscopy;  Laterality: N/A;  . INGUINAL HERNIA REPAIR    . JOINT REPLACEMENT    . LEFT HEART CATH AND CORS/GRAFTS ANGIOGRAPHY N/A 06/27/2017   Procedure: LEFT HEART CATH AND  CORS/GRAFTS ANGIOGRAPHY;  Surgeon: [name], MD;  Location: [place] CV LAB;  Service: Cardiovascular;  Laterality: N/A;  . NASAL SINUS SURGERY    . SHOULDER OPEN ROTATOR CUFF REPAIR Right   . TOTAL KNEE ARTHROPLASTY Bilateral     Social History   Socioeconomic History  . Marital status: Widowed    Spouse name: Not on file  . Number of children: Not on file  . Years of education: Not on file  . Highest education level: Not on file  Occupational History  . Not on file  Social Needs  . Financial resource strain: Not on file  . Food insecurity:     Worry: Not on file    Inability: Not on file  . Transportation needs:    Medical: Not on file    Non-medical: Not on file  Tobacco Use  . Smoking status: Never Smoker  . Smokeless tobacco: Former Systems developer    Types: Chew  . Tobacco comment: "quit chewing in the 1960s"  Substance and Sexual Activity  . Alcohol use: No  . Drug use: No  . Sexual activity: Never  Lifestyle  . Physical activity:    Days per week: Not on file    Minutes per session: Not on file  . Stress: Not on file  Relationships  . Social connections:    Talks on phone: Not on file    Gets together: Not on file    Attends religious service: Not on file    Active member of club or organization: Not on file    Attends meetings of clubs or organizations: Not on file    Relationship status: Not on file  . Intimate partner violence:    Fear of current or ex partner: Not on file    Emotionally abused: Not on file    Physically abused: Not on file    Forced sexual activity: Not on file  Other Topics Concern  . Not on file  Social History Narrative   01-05-18 Unable to ask abuse questions family with him today.   11-012-19 Unable to ask abuse questions family with him today.    Allergies  Allergen Reactions  . Feldene [Piroxicam] Other (See Comments)    REACTION: Blisters  . Statins Other (See Comments)    Myalgias  . Doxycycline Other (See Comments)    REACTION:  unknown  . Latex Rash  . Tequin Anxiety  . Valium Other (See Comments)    REACTION:  unknown    Family History  Problem Relation Age of Onset  . Arthritis-Osteo Sister   . Heart attack Brother   . Heart attack Other   . Cancer Neg Hx     Prior to Admission medications   Medication Sig Start Date End Date Taking? Authorizing Provider  acetaminophen (TYLENOL) 325 MG tablet Take 1-2 tablets (325-650 mg total) by mouth every 4 (four) hours as needed for mild pain. 08/09/18  Yes Love, Ivan Anchors, PA-C  albuterol (VENTOLIN HFA) 108 (90 Base) MCG/ACT  inhaler Inhale 2 puffs into the lungs every 4 (four) hours as needed for wheezing or shortness of breath. 08/24/18  Yes Aline August, MD  aspirin EC 81 MG EC tablet Take 1 tablet (81 mg total) by mouth daily. Patient taking differently: Take 81 mg by mouth at bedtime.  08/14/18  Yes Angiulli, Lavon Paganini, PA-C  camphor-menthol [facility]) lotion Apply 1 application topically daily as needed for itching. 08/24/18  Yes Aline August, MD  cephALEXin [facility])  500 MG capsule Take 1 capsule (500 mg total) by mouth 2 (two) times daily for 4 days. 08/24/18 08/28/18 Yes Aline August, MD  furosemide (LASIX) 20 MG tablet Take 1 tablet (20 mg total) by mouth daily. 08/14/18  Yes Angiulli, Lavon Paganini, PA-C  Lifitegrast [facility] Friar) 5 % SOLN Place 1-2 drops into both eyes daily.    Yes [provider]  magnesium oxide (MAG-OX) 400 (241.3 Mg) MG tablet Take 1 tablet (400 mg total) by mouth 2 (two) times daily. 08/14/18  Yes Angiulli, Lavon Paganini, PA-C  metoprolol tartrate (LOPRESSOR) 25 MG tablet Take 0.5 tablets (12.5 mg total) by mouth 2 (two) times daily. 08/14/18  Yes Angiulli, Lavon Paganini, PA-C  nitroGLYCERIN (NITROSTAT) 0.4 MG SL tablet Place 1 tablet (0.4 mg total) under the tongue every 5 (five) minutes as needed for chest pain. 12/06/17  Yes Daune Perch, NP  pantoprazole (PROTONIX) 40 MG tablet Take 1 tablet (40 mg total) by mouth every other day. 08/14/18  Yes Angiulli, Lavon Paganini, PA-C  polyethylene glycol (MIRALAX / GLYCOLAX) 17 g packet Take 17 g by mouth daily as needed for mild constipation. 08/14/18  Yes Angiulli, Lavon Paganini, PA-C  rosuvastatin (CRESTOR) 5 MG tablet Take 1 tablet (5 mg total) by mouth daily at 6 PM. 08/14/18  Yes Angiulli, Lavon Paganini, PA-C  tamsulosin (FLOMAX) 0.4 MG CAPS capsule Take 1 capsule (0.4 mg total) by mouth daily. 08/14/18  Yes Angiulli, Lavon Paganini, PA-C  Hydrocortisone (GERHARDT'S BUTT CREAM) CREA Apply 5 application topically 2 (two) times daily. Patient not taking: Reported on 08/26/2018  08/14/18   Angiulli, Lavon Paganini, PA-C  Hydrocortisone (GERHARDT'S BUTT CREAM) CREA Apply 1 application topically 2 (two) times daily. Patient not taking: Reported on 08/26/2018 08/24/18   Aline August, MD    Physical Exam: Vitals:   08/26/18 1300 08/26/18 1645 08/26/18 1700 08/26/18 1715  BP: 121/63 105/75 109/70 (!) 120/58  Pulse: (!) 104 94 92 100  Resp: (!) 26 (!) 21 (!) 22 18  Temp:      TempSrc:      SpO2: 99% 99% 98% 99%  Weight:      Height:         . General:  Appears calm and comfortable and is NAD . Eyes:  PERRL, EOMI, normal lids, iris . ENT:  grossly normal hearing, lips & tongue, mmm; edentulous . Neck:  no LAD, masses or thyromegaly . Cardiovascular:  RRR, no m/r/g. No LE edema.  Marland Kitchen Respiratory:   CTA bilaterally with no wheezes/rales/rhonchi.  Normal respiratory effort. . Abdomen:  soft, NT, ND, NABS . Skin:  no rash or induration seen on limited exam . Musculoskeletal:  grossly normal tone BUE/BLE, good ROM, no bony abnormality . Psychiatric:  grossly normal mood and affect, speech fluent and appropriate, AOx3 . Neurologic:  CN 2-12 grossly intact, moves all extremities in coordinated fashion, sensation intact    Radiological Exams on Admission: Ct Angio Chest Pe W And/or Wo Contrast  Result Date: 08/26/2018 CLINICAL DATA:  Shortness of breath with tachypnea. History of congestive heart failure and atrial fibrillation. History of right lower lobe lung cancer. EXAM: CT ANGIOGRAPHY CHEST WITH CONTRAST TECHNIQUE: Multidetector CT imaging of the chest was performed using the standard protocol during bolus administration of intravenous contrast. Multiplanar CT image reconstructions and MIPs were obtained to evaluate the vascular anatomy. CONTRAST:  30mL OMNIPAQUE IOHEXOL 350 MG/ML SOLN COMPARISON:  Radiograph same date.  CT 07/21/2018 and 06/01/2018. FINDINGS: Cardiovascular: The pulmonary arteries are well  opacified with contrast to the level of the subsegmental branches.  There is no evidence of acute pulmonary embolism. There is diffuse atherosclerosis of the aorta, great vessels and coronary arteries status post median sternotomy and CABG. There is limited opacification of the systemic arteries. Stable mild cardiomegaly without pericardial effusion. There are calcifications of the aortic valve. Mediastinum/Nodes: Mildly enlarged right paratracheal, subcarinal and inferior right hilar lymph nodes are similar to the most recent study. No progressive adenopathy. The thyroid gland, trachea and esophagus demonstrate no significant findings. Lungs/Pleura: There is a stable small right pleural effusion. Underlying moderate emphysematous changes are present with diffuse central airway thickening. The dependent airspace disease in the right lower lobe has not significantly changed from the previous study. No new airspace opacities or focal pulmonary nodules. Upper abdomen: No acute findings. There is mild reflux of contrast into the IVC and hepatic veins. There is mild contour irregularity of the liver suggesting possible cirrhosis. Musculoskeletal/Chest wall: There is no chest wall mass or suspicious osseous finding. Review of the MIP images confirms the above findings. IMPRESSION: 1. No evidence of acute pulmonary embolism or other acute chest process. 2. Right pleural effusion and right basilar airspace disease have not significantly changed from the recent prior study. However, there is increased right infrahilar nodularity compared with older prior studies, and local recurrence lung cancer remains a concern. 3. Aortic Atherosclerosis (ICD10-I70.0) and Emphysema (ICD10-J43.9). Electronically Signed   By: Richardean Sale M.D.   On: 08/26/2018 15:10   Dg Chest Portable 1 View  Result Date: 08/26/2018 CLINICAL DATA:  Shortness of breath EXAM: PORTABLE CHEST 1 VIEW COMPARISON:  Aug 23, 2018 FINDINGS: There is slight bibasilar atelectasis. There is no edema or consolidation. Heart is  mildly enlarged with pulmonary vascularity normal. Patient is status post internal mammary bypass grafting. I there is aortic atherosclerosis. No adenopathy. Bones are osteoporotic. There is degenerative change in each shoulder. IMPRESSION: No edema or consolidation. Slight bibasilar atelectasis. Stable cardiac prominence. Postoperative changes noted. Aortic Atherosclerosis (ICD10-I70.0). Electronically Signed   By: Lowella Grip III M.D.   On: 08/26/2018 10:45    EKG: Independently reviewed.  Sinus tachcardia with rate 101; nonspecific ST changes with no evidence of acute ischemia   Labs on Admission: I have personally reviewed the available labs and imaging studies at the time of the admission.  Pertinent labs:   CO2 18 BUN 13/Creatinine 1.30/GFR 49; 11/1.07/>60 on 5/22 Albumin 2.8 BNP 267.7 - improved Troponin <0.03 WBC 9.5 Hgb 11.2 - improved D-dimer 1.30 COVID negative 5/19 and 5/24  Assessment/Plan Principal Problem:   COPD (chronic obstructive pulmonary disease) (HCC) Active Problems:   Essential hypertension   Adenocarcinoma of right lung (HCC)   Atrial fibrillation (HCC)   Diastolic CHF (HCC)   CKD (chronic kidney disease), stage III (HCC)   COPD with h/o lung CA -Patient recently admitted for acute metabolic encephalopathy thought to be related to E coli UTI; he is still completing antibiotics for this issue and appears to be at his normal mental status at this time -However, during that hospitalization he was found to have hypoxia for uncertain reason -He was discharged on home O2 -Today, he developed acute onset of SOB at home -He was initially on NRB and has transitioned to [place] O2 -He was COVID negative during prior hospitalization and today -CTA is generally unremarkable and unchanged from prior - no PE, unchanged R pleural effusion -There is increased R infrahilar nodularity noted which could indicate a local recurrence  of cancer -However, he also has a h/o  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 and may have underlying chronic COPD -He does not currently appear to be in distress and I am not overly concerned that this is an acute exacerbation -It is also not entirely clear if he has been using his O2 continuously -Palliative care discussions were held during prior hospitalization and there was consideration of transitioning to hospice -Will observe overnight -COPD order set utilized and will use nebs and 20 mg prednisone daily x 5 days without antibiotics -Continue [place] O2 -Palliative care consult -The patient was clear in his desire for a natural death and so has been made DNR -He did ask that I speak with his daughter but there was no answer or answering machine when I attempted to call  Afib -Rate controlled on Lopressor -Not on [facility] following recent intracranial hemorrhage  Chronic diastolic CHF -Normal left ventricular function on echo in 1/20 with persistent right heart increased pressure -Unlikely that SOB was related to CHF and appears compensated at this time  HTN -Continue Lopressor  Stage 3 CKD -Slightly worse than prior but does not technically qualify as AKI -Hold Lasix -Recheck BMP in AM   Note: This patient has been tested and is negative for the novel coronavirus COVID-19.  DVT prophylaxis:  SCDs Code Status:  DNR - confirmed with patient Family Communication: None present; I was unable to reach his daughter by telephone Disposition Plan:  Home once clinically improved Consults called: Palliative care  Admission status: It is my clinical opinion that referral for OBSERVATION is reasonable and necessary in this patient based on the above information provided. The aforementioned taken together are felt to place the patient at high risk for further clinical deterioration. However it is anticipated that the patient may be medically stable for discharge from the hospital within 24 to 48 hours.    [name] MD [facility]   How to contact the [facility] Attending or Consulting provider [place] or covering provider during after hours [place], for this patient?  1. Check the care team in [facility] and look for a) attending/consulting TR[facility] provider listed and b) the [facility] team listed 2. Log into [email or web address] and use [place]'s universal password to access. If you do not have the password, please contact the hospital operator. 3. Locate the [facility] provider you are looking for under [facility] and page to a number that you can be directly reached. 4. If you still have difficulty reaching the provider, please page the [facility] (Director on Call) for the Hospitalists listed on amion for assistance.   08/26/2018, 6:35 PM

## 2018-08-26 NOTE — ED Triage Notes (Addendum)
Pt was at home with daughter when he became SOB, EMS reported that he was tachypneic at 60 breaths/min. Daughter gave a neb tx at home & he was given O2 via non-rebreather while in route & was still wearing it upon arival to ED with O2 sat 100%, Pt is A/O to self & place. 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

## 2018-08-26 NOTE — ED Notes (Signed)
Patient transported to CT 

## 2018-08-27 DIAGNOSIS — N183 Chronic kidney disease, stage 3 (moderate): Secondary | ICD-10-CM | POA: Diagnosis not present

## 2018-08-27 DIAGNOSIS — L8990 Pressure ulcer of unspecified site, unspecified stage: Secondary | ICD-10-CM

## 2018-08-27 DIAGNOSIS — I5032 Chronic diastolic (congestive) heart failure: Secondary | ICD-10-CM | POA: Diagnosis not present

## 2018-08-27 DIAGNOSIS — J438 Other emphysema: Secondary | ICD-10-CM | POA: Diagnosis not present

## 2018-08-27 DIAGNOSIS — L89109 Pressure ulcer of unspecified part of back, unspecified stage: Secondary | ICD-10-CM

## 2018-08-27 DIAGNOSIS — C3491 Malignant neoplasm of unspecified part of right bronchus or lung: Secondary | ICD-10-CM | POA: Diagnosis not present

## 2018-08-27 DIAGNOSIS — J449 Chronic obstructive pulmonary disease, unspecified: Secondary | ICD-10-CM | POA: Diagnosis not present

## 2018-08-27 DIAGNOSIS — Z515 Encounter for palliative care: Secondary | ICD-10-CM | POA: Diagnosis not present

## 2018-08-27 DIAGNOSIS — Z7189 Other specified counseling: Secondary | ICD-10-CM | POA: Diagnosis not present

## 2018-08-27 LAB — CBC
HCT: 31.3 % — ABNORMAL LOW (ref 39.0–52.0)
Hemoglobin: 10.1 g/dL — ABNORMAL LOW (ref 13.0–17.0)
MCH: 29.6 pg (ref 26.0–34.0)
MCHC: 32.3 g/dL (ref 30.0–36.0)
MCV: 91.8 fL (ref 80.0–100.0)
Platelets: 138 10*3/uL — ABNORMAL LOW (ref 150–400)
RBC: 3.41 MIL/uL — ABNORMAL LOW (ref 4.22–5.81)
RDW: 14 % (ref 11.5–15.5)
WBC: 5 10*3/uL (ref 4.0–10.5)
nRBC: 0 % (ref 0.0–0.2)

## 2018-08-27 LAB — BASIC METABOLIC PANEL
Anion gap: 10 (ref 5–15)
BUN: 12 mg/dL (ref 8–23)
CO2: 20 mmol/L — ABNORMAL LOW (ref 22–32)
Calcium: 8.7 mg/dL — ABNORMAL LOW (ref 8.9–10.3)
Chloride: 105 mmol/L (ref 98–111)
Creatinine, Ser: 1.17 mg/dL (ref 0.61–1.24)
GFR calc Af Amer: >60 mL/min (ref >60)
GFR calc non Af Amer: 55 mL/min — ABNORMAL LOW (ref >60)
Glucose, Bld: 157 mg/dL — ABNORMAL HIGH (ref 70–99)
Potassium: 4.2 mmol/L (ref 3.5–5.1)
Sodium: 135 mmol/L (ref 135–145)

## 2018-08-27 NOTE — Evaluation (Signed)
Physical Therapy Evaluation Patient Details Name: Evan [name] MRN: [id number] DOB: 1928-01-28 Today's Date: 08/27/2018   History of Present Illness  85 y.o. 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 Pt with copd. He was admitted from 5/19-22 for acute metabolic encephalopathy attributed to E coli UTI; hypoxia, discharged on home O2; recent L thalamic intracranial hemorrhage.   Clinical Impression  Pt admitted with above diagnosis and presents to PT with functional limitations due to deficits listed below (See PT problem list). Pt needs skilled PT to maximize independence and safety to allow discharge to home with continued assist of very supportive family.      Follow Up Recommendations Home health PT;Supervision/Assistance - 24 hour    Equipment Recommendations  None recommended by PT    Recommendations for Other Services       Precautions / Restrictions Precautions Precautions: Fall Precaution Comments: R "alien arm"; fragile skin      Mobility  Bed Mobility Overal bed mobility: Needs Assistance Bed Mobility: Supine to Sit     Supine to sit: Mod assist     General bed mobility comments: Assist to bring legs off of bed, elevate trunk into sitting and bring hips to EOB  Transfers Overall transfer level: Needs assistance Equipment used: 2 person hand held assist;Rolling walker (2 wheeled) Transfers: Sit to/from [facility] Sit to Stand: Mod assist;+2 physical assistance Stand pivot transfers: Mod assist;+2 physical assistance       General transfer comment: Assist to bring hips up and for balance. Assist to stabilize RUE and RLE. With pivot from bed to bsc to recliner pt with ataxic movements requiring assist for control and to turn. Pt stood with Rolling walker for  [facility]. Did not attempt to use RW for anything other than static standing  Ambulation/Gait                Stairs            Wheelchair Mobility    Modified Rankin (Stroke Patients Only)       Balance Overall balance assessment: Needs assistance Sitting-balance support: Single extremity supported Sitting balance-[name] Scale: Poor Sitting balance - Comments: UE support and min guard   Standing balance support: Bilateral upper extremity supported Standing balance-[name] Scale: Poor Standing balance comment: BUE support and mod assist                             Pertinent Vitals/Pain Pain Assessment: Faces Faces Pain Scale: No hurt    Home Living Family/patient expects to be discharged to:: Private residence Living Arrangements: Children Available Help at Discharge: Family;Available 24 hours/day Type of Home: House Home Access: Ramped entrance   Entrance Stairs-Number of Steps: 4; also has ramp which was for his wife/deceased Home Layout: Two level;Laundry or work area in basement;Able to live on main level with bedroom/bathroom Home Equipment: Environmental consultant - 2 wheels;Shower seat;Cane - single point;Wheelchair - manual;Hospital bed      Prior Function Level of Independence: Needs assistance   Gait / Transfers Assistance Needed: +2 A for bed/wc/BSC transfer  ADL's / Homemaking Assistance Needed: Family assists with all ADL. Pt able to complete ADL tasks but "we baby him and do it for him"        Hand Dominance   Dominant Hand: (using L  due to R UE impairment)    Extremity/Trunk Assessment   Upper Extremity Assessment Upper Extremity Assessment: Defer to OT evaluation RUE Deficits / Details: Ataxic RUE; "alien arm", difficulty using functionally RUE Sensation: decreased light touch;decreased proprioception RUE Coordination: decreased fine motor;decreased gross motor    Lower Extremity Assessment Lower Extremity Assessment: RLE  deficits/detail RLE Deficits / Details: Weakness and ataxia RLE Coordination: decreased fine motor;decreased gross motor    Cervical / Trunk Assessment Cervical / Trunk Assessment: Other exceptions(L bias; poor midline awareness)  Communication   Communication: HOH  Cognition Arousal/Alertness: Awake/alert Behavior During Therapy: Impulsive Overall Cognitive Status: No family/caregiver present to determine baseline cognitive functioning                                 General Comments: most likely at his baseline since stroke      General Comments General comments (skin integrity, edema, etc.): Pt likes to read the newspapaer however can not see it without his glasses    Exercises     Assessment/Plan    PT Assessment Patient needs continued PT services  PT Problem List Decreased strength;Decreased activity tolerance;Decreased balance;Decreased mobility;Decreased coordination       PT Treatment Interventions DME instruction;Gait training;Functional mobility training;Therapeutic activities;Therapeutic exercise;Balance training;Patient/family education;Neuromuscular re-education    PT Goals (Current goals can be found in the Care Plan section)  Acute Rehab PT Goals Patient Stated Goal: to go home today PT Goal Formulation: With patient Time For Goal Achievement: 09/10/18 Potential to Achieve Goals: Good    Frequency Min 3X/week   Barriers to discharge        Co-evaluation PT/OT/SLP Co-Evaluation/Treatment: Yes Reason for Co-Treatment: Complexity of the patient's impairments (multi-system involvement);For patient/therapist safety PT goals addressed during session: Mobility/safety with mobility;Balance OT goals addressed during session: ADL's and self-care       AM-PAC PT "6 Clicks" Mobility  Outcome Measure Help needed turning from your back to your side while in a flat bed without using bedrails?: A Little Help needed moving from lying on your back  to sitting on the side of a flat bed without using bedrails?: A Lot Help needed moving to and from a bed to a chair (including a wheelchair)?: A Lot Help needed standing up from a chair using your arms (e.g., wheelchair or bedside chair)?: A Lot Help needed to walk in hospital room?: Total Help needed climbing 3-5 steps with a railing? : Total 6 Click Score: 11    End of Session Equipment Utilized During Treatment: Gait belt Activity Tolerance: Patient tolerated treatment well Patient left: in chair;with chair alarm set(lap belt alarm) Nurse Communication: Mobility status PT Visit Diagnosis: Unsteadiness on feet (R26.81);Other abnormalities of gait and mobility (R26.89)    Time: 7782-4235 PT Time Calculation (min) (ACUTE ONLY): 28 min   Charges:   PT Evaluation $PT Eval Moderate Complexity: [place] Pager [phone] Office [place] 08/27/2018, 1:52 PM

## 2018-08-27 NOTE — Progress Notes (Signed)
Pt seen by MD, orders written for d/c.  Went over discharge instructions with pt's daughter and answered all questions.  Removed IV's, no complications.  Escorted for discharge via wheelchair with all belongings.  Will follow up outpatient with MD.

## 2018-08-27 NOTE — Progress Notes (Signed)
Occupational Therapy Evaluation Patient Details Name: Evan [name] MRN: [id number] DOB: [birth year] Today's Date: 08/27/2018    History of Present Illness 85 y.o. 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 He was admitted from 5/19-22 for acute metabolic encephalopathy attributed to E coli UTI; hypoxia, discharged on home O2; recent L thalamic intracranial hemorrhage.    Clinical Impression   PTA, pt lived at home with his daughter who provides 24/7 care. Pt required +2 A with bed - wc/BSC transfers and family pushed him in the wc. Family assists with all ADL. Pt states "they treat me like a baby", smiles and repetitively asks when he can go home. Spoke with his daughter "Evan [name]" over the phone and they plan for their Dad to DC back home and resume [facility] services when medically stable. Pt most likely close to his functional baseline PTA. SpO2 Sats 100 on RA after session. 1/4 dypsnea. Recommend continuing [place] after DC.     Follow Up Recommendations  Home health OT;Supervision/Assistance - 24 hour;Other (comment)(HHA)    Equipment Recommendations  None recommended by OT    Recommendations for Other Services       Precautions / Restrictions Precautions Precautions: Fall Precaution Comments: R "alien arm"; fragile skin Restrictions Weight Bearing Restrictions: No      Mobility Bed Mobility Overal bed mobility: Needs Assistance Bed Mobility: Supine to Sit     Supine to sit: Mod assist     General bed mobility comments: Able to roll; A to progress L LE off bed and transition trunk to upright  Transfers Overall transfer level: Needs assistance   Transfers: Sit to/from Stand;Stand Pivot Transfers Sit to Stand: Mod assist;+2 physical assistance Stand pivot transfers: Mod assist;+2 physical  assistance            Balance Overall balance assessment: Needs assistance   Sitting balance-[name] Scale: Fair Sitting balance - Comments: r bias     Standing balance-[name] Scale: Poor                             ADL either performed or assessed with clinical judgement   ADL Overall ADL's : Needs assistance/impaired Eating/Feeding: Maximal assistance Eating/Feeding Details (indicate cue type and reason): per ST note 5/22 nectar thick with full supervision Grooming: Moderate assistance;Sitting   Upper Body Bathing: Maximal assistance;Sitting   Lower Body Bathing: Maximal assistance;Sit to/from stand   Upper Body Dressing : Maximal assistance;Sitting   Lower Body Dressing: Total assistance;Sitting/lateral leans;Sit to/from stand   Toilet Transfer: Moderate assistance;+2 for physical assistance;BSC;Stand-pivot   Toileting- Clothing Manipulation and Hygiene: Total assistance       Functional mobility during ADLs: Moderate assistance;+2 for physical assistance(toward L side)       Vision Baseline Vision/History: Wears glasses Wears Glasses: Reading only       Perception     Praxis Praxis Praxis tested?: Deficits Deficits: Limb apraxia    Pertinent Vitals/Pain Pain Assessment: Faces Faces Pain Scale: No hurt     Hand Dominance (using L due to R UE impairment)   Extremity/Trunk Assessment Upper Extremity Assessment Upper Extremity Assessment: RUE deficits/detail RUE Deficits / Details: Ataxic RUE; "alien arm", difficulty using functionally RUE Sensation: decreased light touch;decreased proprioception RUE Coordination: decreased fine motor;decreased gross motor   Lower Extremity Assessment Lower Extremity Assessment: Defer  to PT evaluation   Cervical / Trunk Assessment Cervical / Trunk Assessment: Other exceptions(L bias; poor midline awareness)   Communication     Cognition Arousal/Alertness: Awake/alert Behavior During Therapy:  Impulsive Overall Cognitive Status: No family/caregiver present to determine baseline cognitive functioning                                 General Comments: most likely at his baseline since stroke   General Comments  Pt likes to read the newspapaer however can not see it without his glasses    Exercises     Shoulder Instructions      Home Living Family/patient expects to be discharged to:: Private residence Living Arrangements: Children Available Help at Discharge: Family;Available 24 hours/day Type of Home: House Home Access: Ramped entrance Entrance Stairs-Number of Steps: 4; also has ramp which was for his wife/deceased   Home Layout: Two level;Laundry or work area in basement;Able to live on main level with bedroom/bathroom     Bathroom Shower/Tub: Occupational psychologist: Handicapped height Bathroom Accessibility: Yes How Accessible: Accessible via walker Home Equipment: Environmental consultant - 2 wheels;Shower seat;Cane - single point;Wheelchair - manual;Hospital bed      Lives With: Daughter;Family    Prior Functioning/Environment Level of Independence: Needs assistance  Gait / Transfers Assistance Needed: +2 A for bed/wc/BSC transfer ADL's / Homemaking Assistance Needed: Family assists with all ADL. Pt able to complete ADL tasks but "we baby him and do it for him" Communication / Swallowing Assistance Needed: HOH; ST note 5/22 nectar thick; chopped          OT Problem List: Decreased strength;Decreased range of motion;Decreased activity tolerance;Impaired balance (sitting and/or standing);Impaired vision/perception;Decreased safety awareness;Decreased coordination;Decreased cognition;Decreased knowledge of use of DME or AE;Impaired sensation;Impaired tone;Impaired UE functional use      OT Treatment/Interventions: Self-care/ADL training;Therapeutic exercise;Neuromuscular education;DME and/or AE instruction;Therapeutic activities;Cognitive  remediation/compensation;Visual/perceptual remediation/compensation;Patient/family education;Balance training    OT Goals(Current goals can be found in the care plan section) Acute Rehab OT Goals Patient Stated Goal: to go home today OT Goal Formulation: With patient/family Time For Goal Achievement: 09/10/18 Potential to Achieve Goals: Good  OT Frequency: Min 3X/week   Barriers to D/C:            Co-evaluation PT/OT/SLP Co-Evaluation/Treatment: Yes Reason for Co-Treatment: Complexity of the patient's impairments (multi-system involvement);For patient/therapist safety;To address functional/ADL transfers   OT goals addressed during session: ADL's and self-care      AM-PAC OT "6 Clicks" Daily Activity     Outcome Measure Help from another person eating meals?: A Lot Help from another person taking care of personal grooming?: A Lot Help from another person toileting, which includes using toliet, bedpan, or urinal?: Total Help from another person bathing (including washing, rinsing, drying)?: A Lot Help from another person to put on and taking off regular upper body clothing?: A Lot Help from another person to put on and taking off regular lower body clothing?: Total 6 Click Score: 10   End of Session Equipment Utilized During Treatment: Gait belt Nurse Communication: Mobility status;Other (comment)(nectar thick liquids per last ST note)  Activity Tolerance: Patient tolerated treatment well Patient left: in chair;with call bell/phone within reach;with chair alarm set(posey chair alarm belt)  OT Visit Diagnosis: Unsteadiness on feet (R26.81);Other abnormalities of gait and mobility (R26.89);Muscle weakness (generalized) (M62.81);Apraxia (R48.2);Other symptoms and signs involving cognitive function  Time: 0931-1216 OT Time Calculation (min): 28 min Charges:  OT General Charges $OT Visit: 1 Visit OT Evaluation $OT Eval Moderate Complexity: [place], OT/L    Acute OT Clinical Specialist Acute Rehabilitation Services Pager [phone] Office [phone]   [facility] 08/27/2018, 11:31 AM

## 2018-08-27 NOTE — Consult Note (Signed)
Consultation Note Date: 08/27/2018   Patient Name: Evan [name]  DOB: 1927/12/15  MRN: [id number]  Age / Sex: 85 y.o., male   PCP: [name], MD Referring Physician: No att. providers found   REASON FOR CONSULTATION:Establishing goals of care  Palliative Care consult requested for this 85 y.o. male with multiple medical problems including defibrillation (no anticoagulation), PE, stage I right lung adenocarcinoma S/B SBRT, radiation fibrosis, hypertension, chronic kidney disease stage II, hyperlipidemia, hard of hearing, anxiety, depression, coronary artery disease, BPH, and MI.  Patient was recently admitted in April 2020 with a small left traumatic intracranial hemorrhage.  He was discharged to inpatient rehab (07/16/18-08/15/18) and then to home with family. Admitted also 5/16-5/22 for acute metabolic encephalopathy related to UTI and hypoxia. He was discharged home with oxygen, palliative, and home health. Patient presented this admission with similar episodes of shortness of breath and increased work of breathing.   Clinical Assessment and Goals of Care: I have reviewed medical records including lab results, imaging, Epic notes, and MAR, received report from the bedside RN, and assessed the patient.  I spoke with patient's daughter/poa, Casper Harrison via phone to discuss diagnosis prognosis, [place], EOL wishes, disposition and options.  Patient is awake, alert, and sitting up in recliner.  I re-introduced Palliative Medicine to patient and his daughter. Patient's daughter verbalized understanding and remembrance of discussions previously. She reports patient was not home long enough to be seen by outpatient palliative.   I discussed with daughter patient's current illness and co-morbidities. We discussed how patient was coping once he returned home and any changes that she may have seen. Daughter expressed she felt that he was doing better and showed signs of increased strength after  his recent discharge. She shared he was doing well until Sunday and then began having shortness of breath despite home oxygen and breathing techniques to increase his saturation. She reports the home nurse came out and took his vitals on Saturday and they were fine. [name] stated they have a home pulse oximeter and when patient's oxygen remained in the 70s she called 911 for transport.   I attempted to educated her on his chronic co-morbidities and trajectory. We discussed in detail goals of care for patient. Patient continues to verbalize "I love seeing you but I don't want to keep coming back and forth to this place. I would rather be at home!" Daughter aware of patient's statement, however she continues to state, she is going to bring him anytime he can't breath because she doesn't want him to suffer and she doesn't know what to do to help him breath. I attempted to explain ways to keep him comfortable in the home.   I discussed the role of Palliative versus hospice in detail. I explained my recommendation would be hospice for support and continuous symptom management. Daughter declined and stating "he is going to live, we are not ready for hospice and I know that people die within 6 months because my mother had them and died". She expressed her mother received good care but continued to compare her mother's health conditions to her fathers and that he was not as sick as she was with cancer. I explained to daughter that different co-morbidities present differently and that he is appropriate for hospice and does not have to have active or aggressive cancer to be under hospice. I encouraged daughter to focus on his quality of life, his verbalized wishes, and what is  most important to [name]. Daughter verbalized understanding, however continued to express she and her siblings are hopeful for improvement, sharing family has been told on 4 different occassions over the past year that he was going to die and  he has not. Support given and again attempted to explain quality of life and importance of patient's wishes.   Daughter verbalized understanding and expressed they would like to continue with outpatient palliative support and are not considering hospice at this time. Family requesting to continue with all home health support and full aggressive care.   Daughter did verbalized that after further discussions at home they would not want heroic measures and confirmed DNR/DNI.   Questions and concerns were addressed. PMT will continue to support holistically.   SOCIAL HISTORY:     reports that he has never smoked. He has quit using smokeless tobacco.  His smokeless tobacco use included chew. He reports that he does not drink alcohol or use drugs.  CODE STATUS: DNR  ADVANCE DIRECTIVES: Primary Decision Maker: [place] MANAGEMENT:Per attending   Palliative Prophylaxis:   Aspiration, Bowel Regimen, Delirium Protocol, Frequent Pain Assessment and Palliative Wound Care  PSYCHO-SOCIAL/SPIRITUAL:  Support System: Family   Desire for further Chaplaincy support:NO   Additional Recommendations (Limitations, Scope, Preferences):  Full Scope Treatment and continue to treat the treatable.    PAST MEDICAL HISTORY: Past Medical History:  Diagnosis Date  . Adenocarcinoma of right lung ([place]) 01/06/2016  . Anginal chest pain at rest [facility])    Chronic non, controlled on Lorazepam  . Anxiety   . Arthritis    "all over"   . BPH (benign prostatic hyperplasia)   . CAD (coronary artery disease)   . Chronic lower back pain   . Complication of anesthesia    "problems making water afterwards"  . Depression   . DJD (degenerative joint disease)   . Esophageal ulcer without bleeding    bid ppi indefinitely  . Family history of adverse reaction to anesthesia    "children w/PONV"  . GERD (gastroesophageal reflux disease)   . Headache    "couple/week maybe" (09/04/2015)  .  Hemochromatosis    Possible, elevated iron stores, hook-like osteophytes on hand films, normal LFTs  . Hepatitis    "yellow jaundice as a baby"  . History of ASCVD    MULTIVESSEL  . Hyperlipidemia   . Hypertension   . Osteoarthritis   . Pneumonia    "several times; got a little now" (09/04/2015)  . Rheumatoid arthritis ([place])   . Subdural hematoma ([facility])    small after fall 09/2016-plavix held, neurosurgery consulted.  Asymptomatic.    Marland Kitchen 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[place]) 02/2018   on Lovenox lifelong since he failed PO Eliquis    PAST SURGICAL HISTORY:  Past Surgical History:  Procedure Laterality Date  . ARM SKIN LESION BIOPSY / EXCISION Left 09/02/2015  . CATARACT EXTRACTION W/ INTRAOCULAR LENS  IMPLANT, BILATERAL Bilateral   . CHOLECYSTECTOMY OPEN    . CORNEAL TRANSPLANT Bilateral    "one at [facility]; one at [facility]"  . CORONARY ANGIOPLASTY WITH STENT PLACEMENT    . CORONARY ARTERY BYPASS GRAFT  1999  . DESCENDING AORTIC ANEURYSM REPAIR W/ STENT     DE stent ostium into the right radial free graft at OM-, 02-2007  . ESOPHAGOGASTRODUODENOSCOPY N/A 11/09/2014   Procedure: ESOPHAGOGASTRODUODENOSCOPY (EGD);  Surgeon: [name], MD;  Location: [facility] ENDOSCOPY;  Service: Endoscopy;  Laterality: N/A;  . INGUINAL HERNIA REPAIR    .  JOINT REPLACEMENT    . LEFT HEART CATH AND CORS/GRAFTS ANGIOGRAPHY N/A 06/27/2017   Procedure: LEFT HEART CATH AND CORS/GRAFTS ANGIOGRAPHY;  Surgeon: [name], MD;  Location: [facility] CV LAB;  Service: Cardiovascular;  Laterality: N/A;  . NASAL SINUS SURGERY    . SHOULDER OPEN ROTATOR CUFF REPAIR Right   . TOTAL KNEE ARTHROPLASTY Bilateral     ALLERGIES:  is allergic to feldene [piroxicam]; statins; doxycycline; latex; tequin; and valium.   MEDICATIONS:  Current Facility-Administered Medications  Medication Dose Route Frequency Provider Last Rate Last Dose  . albuterol (PROVENTIL) (2.5 MG/3ML) 0.083% nebulizer solution 2.5 mg  2.5 mg Nebulization Q2H PRN [name], MD      . aspirin EC tablet 81 mg  81 mg Oral Ivery Quale, MD   81 mg at 08/26/18 2138  . cephALEXin (KEFLEX) capsule 500 mg  500 mg Oral BID [name], MD   500 mg at 08/27/18 7035  . MEDLINE mouth rinse  15 mL Mouth Rinse BID [name], MD   15 mL at 08/27/18 0928  . metoprolol tartrate (LOPRESSOR) tablet 12.5 mg  12.5 mg Oral BID [name], MD   12.5 mg at 08/27/18 0925  . pantoprazole (PROTONIX) EC tablet 40 mg  40 mg Oral Cathlean Sauer, MD   40 mg at 08/27/18 0093  . polyethylene glycol (MIRALAX / GLYCOLAX) packet 17 g  17 g Oral Daily PRN [name], MD      . predniSONE (DELTASONE) tablet 20 mg  20 mg Oral Q breakfast [name], MD   20 mg at 08/27/18 0925  . rosuvastatin (CRESTOR) tablet 5 mg  5 mg Oral q1800 [name], MD   5 mg at 08/26/18 2138  . tamsulosin (FLOMAX) capsule 0.4 mg  0.4 mg Oral QPC supper [name], MD   0.4 mg at 08/26/18 2138   Current Outpatient Medications  Medication Sig Dispense Refill  . acetaminophen (TYLENOL) 325 MG tablet Take 1-2 tablets (325-650 mg total) by mouth every 4 (four) hours as needed for mild pain.    Marland Kitchen albuterol (VENTOLIN HFA) 108 (90 Base) MCG/ACT inhaler Inhale 2 puffs into the lungs every 4 (four) hours as needed for wheezing or shortness of breath. 1 Inhaler 0  . aspirin EC 81 MG EC tablet Take 1 tablet (81 mg total) by mouth daily. (Patient taking differently: Take 81 mg by mouth at bedtime. )    . camphor-menthol (SARNA) lotion Apply 1 application topically daily as needed for itching.    . cephALEXin (KEFLEX) 500 MG capsule Take 1 capsule (500 mg total) by mouth 2 (two) times daily for 4 days. 8 capsule 0  . furosemide (LASIX) 20 MG tablet Take 1 tablet (20 mg total) by mouth daily. 30 tablet 0  . Lifitegrast (XIIDRA) 5 % SOLN Place 1-2 drops into both eyes daily.     . magnesium oxide (MAG-OX) 400 (241.3 Mg) MG tablet Take 1 tablet (400 mg total) by mouth 2 (two) times  daily. 180 tablet 3  . metoprolol tartrate (LOPRESSOR) 25 MG tablet Take 0.5 tablets (12.5 mg total) by mouth 2 (two) times daily. 30 tablet 0  . nitroGLYCERIN (NITROSTAT) 0.4 MG SL tablet Place 1 tablet (0.4 mg total) under the tongue every 5 (five) minutes as needed for chest pain. 25 tablet 2  . pantoprazole (PROTONIX) 40 MG tablet Take 1 tablet (40 mg total) by mouth every other day. 30 tablet 0  . polyethylene  glycol (MIRALAX / GLYCOLAX) 17 g packet Take 17 g by mouth daily as needed for mild constipation. 14 each 0  . rosuvastatin (CRESTOR) 5 MG tablet Take 1 tablet (5 mg total) by mouth daily at 6 PM. 30 tablet 0  . tamsulosin (FLOMAX) 0.4 MG CAPS capsule Take 1 capsule (0.4 mg total) by mouth daily. 30 capsule 0    VITAL SIGNS: BP 111/70 (BP Location: Left Arm)   Pulse 87   Temp 97.6 F (36.4 C) (Oral)   Resp 16   Ht 5\' 5"  (1.651 m)   Wt 70.4 kg   SpO2 98%   BMI 25.83 kg/m  Filed Weights   08/26/18 0923 08/26/18 2020  Weight: 86.2 kg 70.4 kg    Estimated body mass index is 25.83 kg/m as calculated from the following:   Height as of this encounter: 5\' 5"  (1.651 m).   Weight as of this encounter: 70.4 kg.  LABS: CBC:    Component Value Date/Time   WBC 5.0 08/27/2018 0446   HGB 10.1 (L) 08/27/2018 0446   HGB 13.4 08/16/2016 1155   HCT 31.3 (L) 08/27/2018 0446   HCT 39.8 08/16/2016 1155   PLT 138 (L) 08/27/2018 0446   PLT 81 (L) 08/16/2016 1155   Comprehensive Metabolic Panel:    Component Value Date/Time   NA 135 08/27/2018 0446   NA 143 08/16/2016 1155   K 4.2 08/27/2018 0446   K 4.3 08/16/2016 1155   CO2 20 (L) 08/27/2018 0446   CO2 24 08/16/2016 1155   BUN 12 08/27/2018 0446   BUN 14.6 08/16/2016 1155   CREATININE 1.17 08/27/2018 0446   CREATININE 1.35 (H) 04/20/2018 1048   CREATININE 1.2 08/16/2016 1155   ALBUMIN 2.8 (L) 08/26/2018 1131   ALBUMIN 3.6 08/16/2016 1155     Review of Systems  Neurological: Positive for weakness.  All other systems  reviewed and are negative.  Physical Exam Vitals signs and nursing note reviewed.  Constitutional:      General: He is awake.     Comments: Chronically ill, very hard of hearing   Cardiovascular:     Rate and Rhythm: Normal rate and regular rhythm.     Pulses: Normal pulses.     Heart sounds: Normal heart sounds.  Pulmonary:     Effort: Pulmonary effort is normal.     Breath sounds: Decreased breath sounds present.     Comments: Some shortness of breath, room air  Abdominal:     General: Bowel sounds are normal.     Palpations: Abdomen is soft.  Skin:    General: Skin is warm and dry.     Findings: Bruising present.  Neurological:     Mental Status: He is alert and oriented to person, place, and time.     Motor: Weakness present.    Prognosis: Guarded to Poor in the setting metabolic encephalopathy, recent left thalamic ICH, generalized deconditioning, hypertension, anxiety, and A-fib.   Discharge Planning:  Home with Home Health with outpatient palliative   Recommendations:  DNR-as confirmed by family   Continue to treat the treatable  Family remains hopeful for improvement. Not interested in hospice. Agrees to continue with outpatient palliative support. Despite lengthy discussion regarding continuous cycles of health decline requiring hospitalizations.   [facility] outpatient palliative was established on previous visit  PMT will continue to follow and support as needed   Palliative Performance Scale: PPS 30%  Daughter, [name] expressed understanding and was in agreement with this plan.   Thank you for allowing the Palliative Medicine Team to assist in the care of this patient.  Time In/Out: 1200-1220 Time In/Out: 1330-1405 Time Total: 55 min.   Visit consisted of counseling and education dealing with the complex and emotionally intense issues of symptom management and palliative care in the setting of serious and potentially life-threatening  illness.Greater than 50%  of this time was spent counseling and coordinating care related to the above assessment and plan.  Signed by:  Alda Lea, AGPCNP-BC Palliative Medicine Team  Phone: [phone] Fax: [phone] Pager: [phone] Amion: Bjorn Pippin

## 2018-08-28 ENCOUNTER — Other Ambulatory Visit: Payer: Self-pay

## 2018-08-28 ENCOUNTER — Inpatient Hospital Stay: Payer: Medicare HMO | Admitting: Family Medicine

## 2018-08-28 ENCOUNTER — Telehealth: Payer: Self-pay | Admitting: *Deleted

## 2018-08-28 DIAGNOSIS — I69218 Other symptoms and signs involving cognitive functions following other nontraumatic intracranial hemorrhage: Secondary | ICD-10-CM | POA: Diagnosis not present

## 2018-08-28 DIAGNOSIS — N183 Chronic kidney disease, stage 3 (moderate): Secondary | ICD-10-CM | POA: Diagnosis not present

## 2018-08-28 DIAGNOSIS — J449 Chronic obstructive pulmonary disease, unspecified: Secondary | ICD-10-CM | POA: Diagnosis not present

## 2018-08-28 DIAGNOSIS — I4891 Unspecified atrial fibrillation: Secondary | ICD-10-CM | POA: Diagnosis not present

## 2018-08-28 DIAGNOSIS — Z86718 Personal history of other venous thrombosis and embolism: Secondary | ICD-10-CM | POA: Diagnosis not present

## 2018-08-28 DIAGNOSIS — I13 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 failure and stage 1 through stage 4 chronic kidney disease, or unspecified chronic kidney disease: Secondary | ICD-10-CM | POA: Diagnosis not present

## 2018-08-28 DIAGNOSIS — I509 Heart failure, unspecified: Secondary | ICD-10-CM | POA: Diagnosis not present

## 2018-08-28 DIAGNOSIS — I2510 Atherosclerotic heart disease of native coronary artery without angina pectoris: Secondary | ICD-10-CM | POA: Diagnosis not present

## 2018-08-28 DIAGNOSIS — C3431 Malignant neoplasm of lower lobe, right bronchus or lung: Secondary | ICD-10-CM | POA: Diagnosis not present

## 2018-08-28 NOTE — Patient Outreach (Signed)
[facility]) Care Management  08/28/2018  DONTE LENZO Sep 27, 1927 [id number]  EMMI: general discharge red alert Referral date: 08/28/18 Referral reason: Know who to call about changes in condition: No,  Scheduled follow up: NO Insurance:  [employer] Day # 1  Telephone call to patient regarding EMMI general discharge red alert. RNCM spoke with patients daughter and designated party release, [name].  HIPAA verified by daughter for patient. RNCM introduced herself and explained reason for call.  Daughter states she was advised by the doctor to call 911 if patients oxygen levels dropped in the 80's.  Daughter states she knows to call patient's primary MD for non emergent issues. Daughter states she will call today to schedule patient a follow up appointment with his primary MD.  Daughter states patient is concerned because his anxiety medication was discontinued while he was in the hospital.  RNCM advised daughter to inform patients primary MD office that the anxiety medication was discontinued so they can advise whether patient should restart the medication or not. Daughter verbalized understanding.  Daughter reports patient is receiving home care services with [facility]. She states  Patient has been contacted by [facility] since discharge from the hospital so that services can resume.  Daughter states the doctor in the hospital stated patient would be referred to a cardiologist. Daughter states she does not think this was done.  RNCM advised daughter to discuss the cardiology referral with patients primary MD.  RNCM explained that the patients primary MD can refer patient to a cardiologist if the referral was not completed.  Daughter states patient was in [facility] for 30 days prior to his recent admission. She states patient developed a sore on his heel and buttocks. Daughter states the [facility] nurse is monitoring and managing this for patient. Daughter states  patient denies any complaints at this time. She states patient is using oxygen as needed. She states she checks patients pulse oximetry 3 times per day. Daughter states she has received conflicting information regarding patients oxygen and whether he should wear it around the clock or as needed. RNCM advised patient to notify patients primary MD office today when she calls for the appointment her concern regarding how patient should wear his oxygen. Daughter states patient has a pulmonologist that he saw sometime this year. Daughter unsure of date of last visit. RNCM advised daughter she can also follow up with patients pulmonologist regarding the frequency at which patient should wear his oxygen. Daughter verbalized understanding. Daughter states patient has his medications and takes them as prescribed. She states patient has transportation assistance for doctor appointments.  RNCM discussed ongoing follow up with [facility] care management services. Daughter declined. Daughter did agree to follow up telephone call with RNCM within 4 business days to assure patient had secured follow up appointments.  RNCM advised patient to notify MD of any changes in condition prior to scheduled appointment. RNCM provided contact name and number:24 hour nurse advise line [phone].  RNCM verified patient aware of 911 services for urgent/ emergent needs. RNCM discussed COVID 19 precautions and symptoms.  Advised patient to contact her doctor for minor symptoms. If symptoms more severe call 911.  Patient verbalized understanding. ,   PLAN; RNCM will follow up with patient's daughter within 4 business days.  RNCM will send patient [facility] care management brochure/ magnet.   Quinn Plowman RN,BSN,CCM [facility] Telephonic  [phone]

## 2018-08-28 NOTE — Telephone Encounter (Signed)
Evan [name] PT called for PT POC but Mr Farooqui has been admitted and discharged by another MD since se discharged from inpt rehab. I notified Evan [name] to call the PCP for orders.

## 2018-08-29 ENCOUNTER — Telehealth: Payer: Self-pay

## 2018-08-29 ENCOUNTER — Telehealth: Payer: Self-pay | Admitting: Nurse Practitioner

## 2018-08-29 DIAGNOSIS — J449 Chronic obstructive pulmonary disease, unspecified: Secondary | ICD-10-CM | POA: Diagnosis not present

## 2018-08-29 DIAGNOSIS — Z86718 Personal history of other venous thrombosis and embolism: Secondary | ICD-10-CM | POA: Diagnosis not present

## 2018-08-29 DIAGNOSIS — I2510 Atherosclerotic heart disease of native coronary artery without angina pectoris: Secondary | ICD-10-CM | POA: Diagnosis not present

## 2018-08-29 DIAGNOSIS — N183 Chronic kidney disease, stage 3 (moderate): Secondary | ICD-10-CM | POA: Diagnosis not present

## 2018-08-29 DIAGNOSIS — I13 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 failure and stage 1 through stage 4 chronic kidney disease, or unspecified chronic kidney disease: Secondary | ICD-10-CM | POA: Diagnosis not present

## 2018-08-29 DIAGNOSIS — I509 Heart failure, unspecified: Secondary | ICD-10-CM | POA: Diagnosis not present

## 2018-08-29 DIAGNOSIS — C3431 Malignant neoplasm of lower lobe, right bronchus or lung: Secondary | ICD-10-CM | POA: Diagnosis not present

## 2018-08-29 DIAGNOSIS — I69218 Other symptoms and signs involving cognitive functions following other nontraumatic intracranial hemorrhage: Secondary | ICD-10-CM | POA: Diagnosis not present

## 2018-08-29 DIAGNOSIS — I4891 Unspecified atrial fibrillation: Secondary | ICD-10-CM | POA: Diagnosis not present

## 2018-08-29 NOTE — Telephone Encounter (Signed)
Bethena Roys, ST from [facility] called requesting orders to resume HHST 2wk4 for patient. Orders approved and given

## 2018-08-29 NOTE — Telephone Encounter (Signed)
Called patient's daughter [name] home and cell number to schedule the Palliative Consult, no answer.  I was unable to leave a message due to mailboxes were full.  Will try again tomorrow

## 2018-08-30 DIAGNOSIS — C3431 Malignant neoplasm of lower lobe, right bronchus or lung: Secondary | ICD-10-CM | POA: Diagnosis not present

## 2018-08-30 DIAGNOSIS — I4891 Unspecified atrial fibrillation: Secondary | ICD-10-CM | POA: Diagnosis not present

## 2018-08-30 DIAGNOSIS — N183 Chronic kidney disease, stage 3 (moderate): Secondary | ICD-10-CM | POA: Diagnosis not present

## 2018-08-30 DIAGNOSIS — I2510 Atherosclerotic heart disease of native coronary artery without angina pectoris: Secondary | ICD-10-CM | POA: Diagnosis not present

## 2018-08-30 DIAGNOSIS — Z86718 Personal history of other venous thrombosis and embolism: Secondary | ICD-10-CM | POA: Diagnosis not present

## 2018-08-30 DIAGNOSIS — I13 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 failure and stage 1 through stage 4 chronic kidney disease, or unspecified chronic kidney disease: Secondary | ICD-10-CM | POA: Diagnosis not present

## 2018-08-30 DIAGNOSIS — I509 Heart failure, unspecified: Secondary | ICD-10-CM | POA: Diagnosis not present

## 2018-08-30 DIAGNOSIS — I69218 Other symptoms and signs involving cognitive functions following other nontraumatic intracranial hemorrhage: Secondary | ICD-10-CM | POA: Diagnosis not present

## 2018-08-30 DIAGNOSIS — J449 Chronic obstructive pulmonary disease, unspecified: Secondary | ICD-10-CM | POA: Diagnosis not present

## 2018-08-31 ENCOUNTER — Ambulatory Visit (INDEPENDENT_AMBULATORY_CARE_PROVIDER_SITE_OTHER): Payer: Medicare HMO | Admitting: Family Medicine

## 2018-08-31 ENCOUNTER — Other Ambulatory Visit: Payer: Self-pay

## 2018-08-31 ENCOUNTER — Encounter: Payer: Self-pay | Admitting: Family Medicine

## 2018-08-31 ENCOUNTER — Ambulatory Visit: Payer: Medicare HMO | Admitting: Interventional Cardiology

## 2018-08-31 VITALS — BP 96/60 | HR 48 | Temp 97.8°F | Resp 16 | Ht 68.0 in

## 2018-08-31 DIAGNOSIS — J449 Chronic obstructive pulmonary disease, unspecified: Secondary | ICD-10-CM | POA: Diagnosis not present

## 2018-08-31 DIAGNOSIS — R0600 Dyspnea, unspecified: Secondary | ICD-10-CM

## 2018-08-31 DIAGNOSIS — N183 Chronic kidney disease, stage 3 (moderate): Secondary | ICD-10-CM | POA: Diagnosis not present

## 2018-08-31 DIAGNOSIS — I4891 Unspecified atrial fibrillation: Secondary | ICD-10-CM | POA: Diagnosis not present

## 2018-08-31 DIAGNOSIS — I2510 Atherosclerotic heart disease of native coronary artery without angina pectoris: Secondary | ICD-10-CM | POA: Diagnosis not present

## 2018-08-31 DIAGNOSIS — I69218 Other symptoms and signs involving cognitive functions following other nontraumatic intracranial hemorrhage: Secondary | ICD-10-CM | POA: Diagnosis not present

## 2018-08-31 DIAGNOSIS — C3431 Malignant neoplasm of lower lobe, right bronchus or lung: Secondary | ICD-10-CM | POA: Diagnosis not present

## 2018-08-31 DIAGNOSIS — Z86718 Personal history of other venous thrombosis and embolism: Secondary | ICD-10-CM | POA: Diagnosis not present

## 2018-08-31 DIAGNOSIS — I13 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 failure and stage 1 through stage 4 chronic kidney disease, or unspecified chronic kidney disease: Secondary | ICD-10-CM | POA: Diagnosis not present

## 2018-08-31 DIAGNOSIS — I509 Heart failure, unspecified: Secondary | ICD-10-CM | POA: Diagnosis not present

## 2018-08-31 MED ORDER — ALPRAZOLAM 0.5 MG PO TABS: 0.2500 mg | ORAL_TABLET | Freq: Three times a day (TID) | ORAL | 0 refills | Status: DC | PRN

## 2018-08-31 NOTE — Discharge Summary (Signed)
[facility] Discharge Summary   Patient: Evan [name] HEN:[id number]   PCP: [name], MD DOB: 1927-10-22   Date of admission: 08/26/2018   Date of discharge: 08/27/2018     Discharge Diagnoses:  Principal Problem:   COPD (chronic obstructive pulmonary disease) ([place]) Active Problems:   Essential hypertension   Adenocarcinoma of right lung (HCC)   Atrial fibrillation (HCC)   Diastolic CHF ([place])   CKD (chronic kidney disease), stage III ([place])   Pressure injury of skin   Admitted From: home Disposition:  home  Recommendations for Outpatient Follow-up:  1. Please follow up with PCP in 1 week   Follow-up Information    [name], MD. Schedule an appointment as soon as possible for a visit in 1 week(s).   Specialty:  Family Medicine Contact information: [address] [place] [address] [phone]          Diet recommendation: cardiac diet  Activity: The patient is advised to gradually reintroduce usual activities.  Discharge Condition: good  Code Status: DNR DNI  History of present illness: As per the H and P dictated on admission, "Evan [name] is a 85 y.o. 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  He reports feeling pretty well now, although he was quite SOB earlier today.  He has a tobacco history and worked in a Research officer, trade union.  He was admitted from 5/19-22 for acute metabolic encephalopathy attributed to E coli UTI; hypoxia, discharged on home O2; recent L thalamic intracranial hemorrhage, now off AC.  Patient was recommended to go to SNF but family refused.  Palliative care was involved and there was discussion among the siblings about monitoring how he does and possibly transitioning to hospice.  ED Course:  SOB after d/c 2 days ago.  Increased WOB  and increased O2 requirement initially.  Better with Albuterol.  CTA unremarkable.  Wheezing now with ongoing tachypnea.   "  Hospital Course:  Summary of his active problems in the hospital is as following. COPD with h/o lung CA -Patient recently admitted for acute metabolic encephalopathy thought to be related to E coli UTI; he is still completing antibiotics for this issue and appears to be at his normal mental status at this time This time he developed acute onset of SOB at home -He was initially on NRB and has transitioned to [place] O2 -He was COVID negative during prior hospitalization and this time -CTA is generally unremarkable and unchanged from prior - no PE, unchanged R pleural effusion -There is increased R infrahilar nodularity noted which could indicate a local recurrence of cancer -However, he also has a h/o 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 and may have underlying chronic COPD -He does not currently appear to be in distress and I am not overly concerned that this is an acute exacerbation -It is also not entirely clear if he has been using his O2 continuously -Palliative care discussions were held during prior hospitalization and there was consideration of transitioning to hospice  Afib -Rate controlled on Lopressor -Not on [facility] following recent intracranial hemorrhage  Chronic diastolic CHF -Normal left ventricular function on echo in 1/20 with persistent right heart increased pressure -Unlikely that SOB was related to CHF and appears compensated at this time  HTN -Continue Lopressor  Stage 3 CKD -Slightly worse  than prior but does not technically qualify as AKI  Pressure ulcer bilateral buttocks POA  Pressure Injury 08/26/18 Stage II -  Partial thickness loss of dermis presenting as a shallow open ulcer with a red, pink wound bed without slough. small circular wounds on both buttocks, 2 on right, 1 on left (Active)  08/26/18 2020  Location: Buttocks    Location Orientation: Left;Right  Staging: Stage II -  Partial thickness loss of dermis presenting as a shallow open ulcer with a red, pink wound bed without slough.  Wound Description (Comments): small circular wounds on both buttocks, 2 on right, 1 on left  Present on Admission: Yes      Patient was seen by physical therapy, who recommended home health which was arranged by case manager. On the day of the discharge the patient's vitals were stable, and no other acute medical condition were reported by patient. the patient was felt safe to be discharge at home with home health.  Consultants: Palliative care  Procedures: none  DISCHARGE MEDICATION: Allergies as of 08/27/2018      Reactions   Feldene [piroxicam] Other (See Comments)   REACTION: Blisters   Statins Other (See Comments)   Myalgias   Doxycycline Other (See Comments)   REACTION:  unknown   Latex Rash   Tequin Anxiety   Valium Other (See Comments)   REACTION:  unknown      Medication List    TAKE these medications   acetaminophen 325 MG tablet Commonly known as:  TYLENOL Take 1-2 tablets (325-650 mg total) by mouth every 4 (four) hours as needed for mild pain.   albuterol 108 (90 Base) MCG/ACT inhaler Commonly known as:  VENTOLIN HFA Inhale 2 puffs into the lungs every 4 (four) hours as needed for wheezing or shortness of breath.   aspirin 81 MG EC tablet Take 1 tablet (81 mg total) by mouth daily. What changed:  when to take this   camphor-menthol lotion Commonly known as:  SARNA Apply 1 application topically daily as needed for itching.   furosemide 20 MG tablet Commonly known as:  LASIX Take 1 tablet (20 mg total) by mouth daily.   magnesium oxide 400 (241.3 Mg) MG tablet Commonly known as:  MAG-OX Take 1 tablet (400 mg total) by mouth 2 (two) times daily.   metoprolol tartrate 25 MG tablet Commonly known as:  LOPRESSOR Take 0.5 tablets (12.5 mg total) by mouth 2 (two) times daily.    nitroGLYCERIN 0.4 MG SL tablet Commonly known as:  NITROSTAT Place 1 tablet (0.4 mg total) under the tongue every 5 (five) minutes as needed for chest pain.   pantoprazole 40 MG tablet Commonly known as:  PROTONIX Take 1 tablet (40 mg total) by mouth every other day.   polyethylene glycol 17 g packet Commonly known as:  MIRALAX / GLYCOLAX Take 17 g by mouth daily as needed for mild constipation.   rosuvastatin 5 MG tablet Commonly known as:  CRESTOR Take 1 tablet (5 mg total) by mouth daily at 6 PM.   tamsulosin 0.4 MG Caps capsule Commonly known as:  FLOMAX Take 1 capsule (0.4 mg total) by mouth daily.   Xiidra 5 % Soln Generic drug:  Lifitegrast Place 1-2 drops into both eyes daily.     ASK your doctor about these medications   cephALEXin 500 MG capsule Commonly known as:  KEFLEX Take 1 capsule (500 mg total) by mouth 2 (two) times daily for 4 days. Ask about: Should  I take this medication?      Allergies  Allergen Reactions   Feldene [Piroxicam] Other (See Comments)    REACTION: Blisters   Statins Other (See Comments)    Myalgias   Doxycycline Other (See Comments)    REACTION:  unknown   Latex Rash   Tequin Anxiety   Valium Other (See Comments)    REACTION:  unknown   Discharge Instructions    DIET DYS 2   Complete by:  As directed    Fluid consistency:  Nectar Thick   Discharge instructions   Complete by:  As directed    It is important that you read the given instructions as well as go over your medication list with RN to help you understand your care after this hospitalization.  Discharge Instructions: Please follow-up with PCP in 1-2 weeks  Please request your primary care physician to go over all Hospital Tests and Procedure/Radiological results at the follow up. Please get all Hospital records sent to your PCP by signing hospital release before you go home.   Do not take more than prescribed Pain, Sleep and Anxiety Medications. You were  cared for by a hospitalist during your hospital stay. If you have any questions about your discharge medications or the care you received while you were in the hospital after you are discharged, you can call the unit @UNIT @ you were admitted to and ask to speak with the hospitalist on call if the hospitalist that took care of you is not available.  Once you are discharged, your primary care physician will handle any further medical issues. Please note that NO REFILLS for any discharge medications will be authorized once you are discharged, as it is imperative that you return to your primary care physician (or establish a relationship with a primary care physician if you do not have one) for your aftercare needs so that they can reassess your need for medications and monitor your lab values. You Must read complete instructions/literature along with all the possible adverse reactions/side effects for all the Medicines you take and that have been prescribed to you. Take any new Medicines after you have completely understood and accept all the possible adverse reactions/side effects. Wear Seat belts while driving.   Increase activity slowly   Complete by:  As directed      Discharge Exam: Filed Weights   08/26/18 0923 08/26/18 2020  Weight: 86.2 kg 70.4 kg   Vitals:   08/27/18 0826 08/27/18 0835  BP: 111/70   Pulse: 87   Resp: 16   Temp: 97.6 F (36.4 C)   SpO2: 100% 98%   General: Appear in no distress, no Rash; Oral Mucosa moist. Cardiovascular: S1 and S2 Present, no Murmur, no JVD Respiratory: Bilateral Air entry present and Clear to Auscultation, no Crackles, no wheezes Abdomen: Bowel Sound present, Soft and no tenderness Extremities: no Pedal edema, no calf tenderness Neurology: Grossly no focal neuro deficit.  The results of significant diagnostics from this hospitalization (including imaging, microbiology, ancillary and laboratory) are listed below for reference.    Significant  Diagnostic Studies: Dg Chest 2 View  Result Date: 08/23/2018 CLINICAL DATA:  Shortness of breath and weakness EXAM: CHEST - 2 VIEW COMPARISON:  08/21/2018 FINDINGS: Cardiac shadows within normal limits. Aortic calcifications are noted. Postsurgical changes are again seen. The lungs are well aerated with evidence of mild atelectatic changes in the right base and right upper lobe along the minor fissure. Mild left basilar atelectasis is noted. No  bony abnormality is seen. IMPRESSION: Mild bibasilar atelectasis as well as right upper lobe atelectatic changes. Follow-up imaging is recommended. Electronically Signed   By: Inez Catalina M.D.   On: 08/23/2018 09:07   Ct Head Wo Contrast  Result Date: 08/21/2018 CLINICAL DATA:  Recent fall. EXAM: CT HEAD WITHOUT CONTRAST TECHNIQUE: Contiguous axial images were obtained from the base of the skull through the vertex without intravenous contrast. COMPARISON:  07/30/2018 FINDINGS: Brain: Interval evolution of left thalamic hemorrhagic infarct. Hemorrhagic component has resolved in the interval. No hydrocephalus or midline shift. No abnormal extra-axial fluid collection. No CT evidence for acute ischemia. Diffuse loss of parenchymal volume is consistent with atrophy. Patchy low attenuation in the deep hemispheric and periventricular white matter is nonspecific, but likely reflects chronic microvascular ischemic demyelination. Lacunar infarct noted right cerebellum. Vascular: No hyperdense vessel or unexpected calcification. Skull: No evidence for fracture. No worrisome lytic or sclerotic lesion. Sinuses/Orbits: Chronic left maxillary sinusitis. Surgical changes right mastoid air cells. Visualized portions of the globes and intraorbital fat are unremarkable. Other: None. IMPRESSION: 1. Interval evolution of the left thalamic infarct. No new or acute intracranial abnormality on today's study. 2. Atrophy with chronic small vessel white matter ischemic disease. Electronically  Signed   By: Misty Stanley M.D.   On: 08/21/2018 18:21   Ct Angio Chest Pe W And/or Wo Contrast  Result Date: 08/26/2018 CLINICAL DATA:  Shortness of breath with tachypnea. History of congestive heart failure and atrial fibrillation. History of right lower lobe lung cancer. EXAM: CT ANGIOGRAPHY CHEST WITH CONTRAST TECHNIQUE: Multidetector CT imaging of the chest was performed using the standard protocol during bolus administration of intravenous contrast. Multiplanar CT image reconstructions and MIPs were obtained to evaluate the vascular anatomy. CONTRAST:  4mL OMNIPAQUE IOHEXOL 350 MG/ML SOLN COMPARISON:  Radiograph same date.  CT 07/21/2018 and 06/01/2018. FINDINGS: Cardiovascular: The pulmonary arteries are well opacified with contrast to the level of the subsegmental branches. There is no evidence of acute pulmonary embolism. There is diffuse atherosclerosis of the aorta, great vessels and coronary arteries status post median sternotomy and CABG. There is limited opacification of the systemic arteries. Stable mild cardiomegaly without pericardial effusion. There are calcifications of the aortic valve. Mediastinum/Nodes: Mildly enlarged right paratracheal, subcarinal and inferior right hilar lymph nodes are similar to the most recent study. No progressive adenopathy. The thyroid gland, trachea and esophagus demonstrate no significant findings. Lungs/Pleura: There is a stable small right pleural effusion. Underlying moderate emphysematous changes are present with diffuse central airway thickening. The dependent airspace disease in the right lower lobe has not significantly changed from the previous study. No new airspace opacities or focal pulmonary nodules. Upper abdomen: No acute findings. There is mild reflux of contrast into the IVC and hepatic veins. There is mild contour irregularity of the liver suggesting possible cirrhosis. Musculoskeletal/Chest wall: There is no chest wall mass or suspicious  osseous finding. Review of the MIP images confirms the above findings. IMPRESSION: 1. No evidence of acute pulmonary embolism or other acute chest process. 2. Right pleural effusion and right basilar airspace disease have not significantly changed from the recent prior study. However, there is increased right infrahilar nodularity compared with older prior studies, and local recurrence lung cancer remains a concern. 3. Aortic Atherosclerosis (ICD10-I70.0) and Emphysema (ICD10-J43.9). Electronically Signed   By: Richardean Sale M.D.   On: 08/26/2018 15:10   Dg Chest Portable 1 View  Result Date: 08/26/2018 CLINICAL DATA:  Shortness of breath EXAM: PORTABLE  CHEST 1 VIEW COMPARISON:  Aug 23, 2018 FINDINGS: There is slight bibasilar atelectasis. There is no edema or consolidation. Heart is mildly enlarged with pulmonary vascularity normal. Patient is status post internal mammary bypass grafting. I there is aortic atherosclerosis. No adenopathy. Bones are osteoporotic. There is degenerative change in each shoulder. IMPRESSION: No edema or consolidation. Slight bibasilar atelectasis. Stable cardiac prominence. Postoperative changes noted. Aortic Atherosclerosis (ICD10-I70.0). Electronically Signed   By: Lowella Grip III M.D.   On: 08/26/2018 10:45   Dg Chest Port 1 View  Result Date: 08/21/2018 CLINICAL DATA:  Shortness of breath.  Pneumonia. EXAM: PORTABLE CHEST 1 VIEW COMPARISON:  08/02/2018 FINDINGS: The cardiac silhouette is enlarged. The patient is status post prior median sternotomy. There are small bilateral pleural effusions. There is a right lower lobe airspace opacity. The previously noted left lower lobe airspace opacity has improved. There is no pneumothorax. IMPRESSION: 1. Cardiomegaly. The patient is status post prior median sternotomy. 2. Trace to small bilateral pleural effusions. 3. Improved aeration at the left lung base. Persistent opacity at the right lung base favored to represent  atelectasis with infiltrate not entirely excluded. Electronically Signed   By: Constance Holster M.D.   On: 08/21/2018 16:16   Dg Chest Port 1 View  Result Date: 08/02/2018 CLINICAL DATA:  Atelectasis. EXAM: PORTABLE CHEST 1 VIEW COMPARISON:  CT chest and chest x-ray dated July 21, 2018. FINDINGS: Stable cardiomediastinal silhouette. Atherosclerotic calcification of the aortic arch. Mildly increased interstitial markings at the lung bases are similar to prior study. Unchanged mild bibasilar atelectasis and trace bilateral pleural effusions. No consolidation or pneumothorax. No acute osseous abnormality. IMPRESSION: 1. Similar appearing bibasilar atelectasis, trace pleural effusions, and mild interstitial edema. Electronically Signed   By: Titus Dubin M.D.   On: 08/02/2018 11:11   Dg Swallowing Func-speech Pathology  Result Date: 08/23/2018 Objective Swallowing Evaluation: Type of Study: MBS-Modified Barium Swallow Study  Patient Details Name: ATLAS CROSSLAND MRN: [id number] Date of Birth: [birth year] Today's Date: 08/23/2018 Time: SLP Start Time (ACUTE ONLY): 41 -SLP Stop Time (ACUTE ONLY): 0756 SLP Time Calculation (min) (ACUTE ONLY): 16 min Past Medical History: Past Medical History: Diagnosis Date  Adenocarcinoma of right lung ([place]) 01/06/2016  Anginal chest pain at rest [facility])   Chronic non, controlled on Lorazepam  Anxiety   Arthritis   "all over"   BPH (benign prostatic hyperplasia)   CAD (coronary artery disease)   Chronic lower back pain   Complication of anesthesia   "problems making water afterwards"  Depression   DJD (degenerative joint disease)   Esophageal ulcer without bleeding   bid ppi indefinitely  Family history of adverse reaction to anesthesia   "children w/PONV"  GERD (gastroesophageal reflux disease)   Headache   "couple/week maybe" (09/04/2015)  Hemochromatosis   Possible, elevated iron stores, hook-like osteophytes on hand films, normal LFTs  Hepatitis   "yellow jaundice  as a baby"  History of ASCVD   MULTIVESSEL  Hyperlipidemia   Hypertension   Myocardial infarction ([place]) 1999  Osteoarthritis   Pneumonia   "several times; got a little now" (09/04/2015)  Rheumatoid arthritis ([place])   Subdural hematoma ([place])   small after fall 09/2016-plavix held, neurosurgery consulted.  Asymptomatic.    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[place]) 02/2018  on Lovenox lifelong since he failed PO Eliquis Past Surgical History: Past Surgical History: Procedure Laterality Date  ARM SKIN LESION BIOPSY / EXCISION Left 09/02/2015  CATARACT EXTRACTION W/ INTRAOCULAR LENS  IMPLANT, BILATERAL Bilateral   CHOLECYSTECTOMY  OPEN    CORNEAL TRANSPLANT Bilateral   "one at [facility]; one at [employer]"  [place] W/ STENT    DE stent ostium into the right radial free graft at OM-, 02-2007  ESOPHAGOGASTRODUODENOSCOPY N/A 11/09/2014  Procedure: ESOPHAGOGASTRODUODENOSCOPY (EGD);  Surgeon: [name], MD;  Location: [facility] ENDOSCOPY;  Service: Endoscopy;  Laterality: N/A;  INGUINAL HERNIA REPAIR    JOINT REPLACEMENT    LEFT HEART CATH AND CORS/GRAFTS ANGIOGRAPHY N/A 06/27/2017  Procedure: LEFT HEART CATH AND CORS/GRAFTS ANGIOGRAPHY;  Surgeon: [name], MD;  Location: [facility] LAB;  Service: Cardiovascular;  Laterality: N/A;  NASAL SINUS SURGERY    SHOULDER OPEN ROTATOR CUFF REPAIR Right   TOTAL KNEE ARTHROPLASTY Bilateral  HPI: Pt is an 85 y/o male admitted secondary to AMS and SOB. AMS likely secondary to UTI. CT revealed evolution of L thalamic infarct. PMH includes MI, CAD, A fib, HTN, CVA with R sided deficits, PE, and R lung adenocarcinoma.  Patient was recently discharged home after an extended CIR stay (4/14-5/12) and was discharged home with family and [place] SLP, on Dys 2, nectar thick liquids diet.  Subjective: pleasant, hard of hearing but has hearing aids (left one seems to work, right does not) Assessment / Plan /  Recommendation CHL IP CLINICAL IMPRESSIONS 08/23/2018 Clinical Impression Pt's oral phase overall functional except for min-mild right buccal cavity residue identified at end of MBS during oral care. Pharyngeal phase, pt's demonstrates delayed closure of laryngeal vestibule/trachea with resultant silent aspiration with thin. Once laryngeal elevation and anterior hyoid movement is achieved larynx is protected however barium fills pyriform sinuses before elevation of larynx is initiated. Aspiration also occured with ching tuck position. He did not have residue post swallow and nectar thick slowed transit allowing for adequate airway protection. Negative observations when esophagus briefly scanned. Recommend continue Dys 2, nectar thick, crush pills and full assist with meals.     SLP Visit Diagnosis Dysphagia, pharyngeal phase (R13.13) Attention and concentration deficit following -- Frontal lobe and executive function deficit following -- Impact on safety and function Moderate aspiration risk;Severe aspiration risk   CHL IP TREATMENT RECOMMENDATION 08/23/2018 Treatment Recommendations Therapy as outlined in treatment plan below   Prognosis 08/23/2018 Prognosis for Safe Diet Advancement Fair Barriers to Reach Goals -- Barriers/Prognosis Comment -- CHL IP DIET RECOMMENDATION 08/23/2018 SLP Diet Recommendations Dysphagia 2 (Fine chop) solids;Nectar thick liquid Liquid Administration via Cup;No straw Medication Administration Crushed with puree Compensations Minimize environmental distractions;Small sips/bites;Slow rate;Lingual sweep for clearance of pocketing Postural Changes Seated upright at 90 degrees   CHL IP OTHER RECOMMENDATIONS 08/23/2018 Recommended Consults -- Oral Care Recommendations Oral care BID Other Recommendations --   CHL IP FOLLOW UP RECOMMENDATIONS 08/23/2018 Follow up Recommendations Skilled Nursing facility;Home health SLP   CHL IP FREQUENCY AND DURATION 08/23/2018 Speech Therapy Frequency (ACUTE ONLY) min  2x/week Treatment Duration 1 week      CHL IP ORAL PHASE 08/23/2018 Oral Phase Impaired Oral - Pudding Teaspoon -- Oral - Pudding Cup -- Oral - Honey Teaspoon -- Oral - Honey Cup -- Oral - Nectar Teaspoon -- Oral - Nectar Cup -- Oral - Nectar Straw -- Oral - Thin Teaspoon -- Oral - Thin Cup -- Oral - Thin Straw -- Oral - Puree -- Oral - Mech Soft -- Oral - Regular Right pocketing in lateral sulci Oral - Multi-Consistency -- Oral - Pill -- Oral Phase -  Comment --  CHL IP PHARYNGEAL PHASE 08/23/2018 Pharyngeal Phase Impaired Pharyngeal- Pudding Teaspoon -- Pharyngeal -- Pharyngeal- Pudding Cup -- Pharyngeal -- Pharyngeal- Honey Teaspoon -- Pharyngeal -- Pharyngeal- Honey Cup -- Pharyngeal -- Pharyngeal- Nectar Teaspoon -- Pharyngeal -- Pharyngeal- Nectar Cup WFL Pharyngeal -- Pharyngeal- Nectar Straw -- Pharyngeal -- Pharyngeal- Thin Teaspoon -- Pharyngeal -- Pharyngeal- Thin Cup Penetration/Aspiration during swallow Pharyngeal Material enters airway, passes BELOW cords without attempt by patient to eject out (silent aspiration) Pharyngeal- Thin Straw -- Pharyngeal -- Pharyngeal- Puree -- Pharyngeal -- Pharyngeal- Mechanical Soft -- Pharyngeal -- Pharyngeal- Regular WFL Pharyngeal -- Pharyngeal- Multi-consistency -- Pharyngeal -- Pharyngeal- Pill -- Pharyngeal -- Pharyngeal Comment --  CHL IP CERVICAL ESOPHAGEAL PHASE 08/23/2018 Cervical Esophageal Phase WFL Pudding Teaspoon -- Pudding Cup -- Honey Teaspoon -- Honey Cup -- Nectar Teaspoon -- Nectar Cup -- Nectar Straw -- Thin Teaspoon -- Thin Cup -- Thin Straw -- Puree -- Mechanical Soft -- Regular -- Multi-consistency -- Pill -- Cervical Esophageal Comment -- [place] Siren 08/23/2018, 10:14 AM Orbie Pyo Colvin Caroli.Ed Actor Pager [phone] Office [phone]               Microbiology: Recent Results (from the past 240 hour(s))  SARS Coronavirus 2 (CEPHEID- Performed in [facility] hospital lab), Hosp Order     Status: None    Collection Time: 08/21/18  4:57 PM  Result Value Ref Range Status   SARS Coronavirus 2 NEGATIVE NEGATIVE Final    Comment: (NOTE) If result is NEGATIVE SARS-CoV-2 target nucleic acids are NOT DETECTED. The SARS-CoV-2 RNA is generally detectable in upper and lower  respiratory specimens during the acute phase of infection. The lowest  concentration of SARS-CoV-2 viral copies this assay can detect is 250  copies / mL. A negative result does not preclude SARS-CoV-2 infection  and should not be used as the sole basis for treatment or other  patient management decisions.  A negative result may occur with  improper specimen collection / handling, submission of specimen other  than nasopharyngeal swab, presence of viral mutation(s) within the  areas targeted by this assay, and inadequate number of viral copies  (<250 copies / mL). A negative result must be combined with clinical  observations, patient history, and epidemiological information. If result is POSITIVE SARS-CoV-2 target nucleic acids are DETECTED. The SARS-CoV-2 RNA is generally detectable in upper and lower  respiratory specimens dur ing the acute phase of infection.  Positive  results are indicative of active infection with SARS-CoV-2.  Clinical  correlation with patient history and other diagnostic information is  necessary to determine patient infection status.  Positive results do  not rule out bacterial infection or co-infection with other viruses. If result is PRESUMPTIVE POSTIVE SARS-CoV-2 nucleic acids MAY BE PRESENT.   A presumptive positive result was obtained on the submitted specimen  and confirmed on repeat testing.  While 2019 novel coronavirus  (SARS-CoV-2) nucleic acids may be present in the submitted sample  additional confirmatory testing may be necessary for epidemiological  and / or clinical management purposes  to differentiate between  SARS-CoV-2 and other Sarbecovirus currently known to infect humans.    If clinically indicated additional testing with an alternate test  methodology [phone] is advised. The SARS-CoV-2 RNA is generally  detectable in upper and lower respiratory sp ecimens during the acute  phase of infection. The expected result is Negative. Fact Sheet for Patients:  StrictlyIdeas.no Fact Sheet for Healthcare Providers: BankingDealers.co.za This test is not  yet approved or cleared by the [place] and has been authorized for detection and/or diagnosis of SARS-CoV-2 by FDA under an Emergency Use Authorization (EUA).  This EUA will remain in effect (meaning this test can be used) for the duration of the COVID-19 declaration under Section 564(b)(1) of the Act, 21 U.S.C. section 360bbb-3(b)(1), unless the authorization is terminated or revoked sooner. Performed at [facility], [place] [address], [place], [place] [address]   Urine culture     Status: Abnormal   Collection Time: 08/21/18  7:34 PM  Result Value Ref Range Status   Specimen Description URINE, RANDOM  Final   Special Requests   Final    NONE Performed at [facility], [place] [address], [place], [place] [address]    Culture >=100,000 COLONIES/mL ESCHERICHIA COLI (A)  Final   Report Status 08/24/2018 FINAL  Final   Organism ID, Bacteria ESCHERICHIA COLI (A)  Final      Susceptibility   Escherichia coli - MIC*    AMPICILLIN >=32 RESISTANT Resistant     CEFAZOLIN <=4 SENSITIVE Sensitive     CEFTRIAXONE <=1 SENSITIVE Sensitive     CIPROFLOXACIN >=4 RESISTANT Resistant     GENTAMICIN >=16 RESISTANT Resistant     IMIPENEM <=0.25 SENSITIVE Sensitive     NITROFURANTOIN <=16 SENSITIVE Sensitive     TRIMETH/SULFA <=20 SENSITIVE Sensitive     AMPICILLIN/SULBACTAM 16 INTERMEDIATE Intermediate     PIP/TAZO <=4 SENSITIVE Sensitive     Extended ESBL NEGATIVE Sensitive     * >=100,000 COLONIES/mL ESCHERICHIA COLI  SARS Coronavirus 2 (CEPHEID- Performed  in [place] hospital lab), Hosp Order     Status: None   Collection Time: 08/26/18  9:54 AM  Result Value Ref Range Status   SARS Coronavirus 2 NEGATIVE NEGATIVE Final    Comment: (NOTE) If result is NEGATIVE SARS-CoV-2 target nucleic acids are NOT DETECTED. The SARS-CoV-2 RNA is generally detectable in upper and lower  respiratory specimens during the acute phase of infection. The lowest  concentration of SARS-CoV-2 viral copies this assay can detect is 250  copies / mL. A negative result does not preclude SARS-CoV-2 infection  and should not be used as the sole basis for treatment or other  patient management decisions.  A negative result may occur with  improper specimen collection / handling, submission of specimen other  than nasopharyngeal swab, presence of viral mutation(s) within the  areas targeted by this assay, and inadequate number of viral copies  (<250 copies / mL). A negative result must be combined with clinical  observations, patient history, and epidemiological information. If result is POSITIVE SARS-CoV-2 target nucleic acids are DETECTED. The SARS-CoV-2 RNA is generally detectable in upper and lower  respiratory specimens dur ing the acute phase of infection.  Positive  results are indicative of active infection with SARS-CoV-2.  Clinical  correlation with patient history and other diagnostic information is  necessary to determine patient infection status.  Positive results do  not rule out bacterial infection or co-infection with other viruses. If result is PRESUMPTIVE POSTIVE SARS-CoV-2 nucleic acids MAY BE PRESENT.   A presumptive positive result was obtained on the submitted specimen  and confirmed on repeat testing.  While 2019 novel coronavirus  (SARS-CoV-2) nucleic acids may be present in the submitted sample  additional confirmatory testing may be necessary for epidemiological  and / or clinical management purposes  to differentiate between  SARS-CoV-2  and other Sarbecovirus currently known to  infect humans.  If clinically indicated additional testing with an alternate test  methodology [id number]) is advised. The SARS-CoV-2 RNA is generally  detectable in upper and lower respiratory sp ecimens during the acute  phase of infection. The expected result is Negative. Fact Sheet for Patients:  StrictlyIdeas.no Fact Sheet for Healthcare Providers: BankingDealers.co.za This test is not yet approved or cleared by the [place] FDA and has been authorized for detection and/or diagnosis of SARS-CoV-2 by FDA under an Emergency Use Authorization (EUA).  This EUA will remain in effect (meaning this test can be used) for the duration of the COVID-19 declaration under Section 564(b)(1) of the Act, 21 U.S.C. section 360bbb-3(b)(1), unless the authorization is terminated or revoked sooner. Performed at [facility], [place] [address] [place], [place] [address]      Labs: CBC: Recent Labs  Lab 08/26/18 1131 08/27/18 0446  WBC 9.5 5.0  NEUTROABS 7.3  --   HGB 11.2* 10.1*  HCT 35.2* 31.3*  MCV 94.6 91.8  PLT 153 665*   Basic Metabolic Panel: Recent Labs  Lab 08/26/18 1131 08/27/18 0446  NA 138 135  K 3.6 4.2  CL 105 105  CO2 18* 20*  GLUCOSE 99 157*  BUN 13 12  CREATININE 1.30* 1.17  CALCIUM 8.9 8.7*   Liver Function Tests: Recent Labs  Lab 08/26/18 1131  AST 23  ALT 14  ALKPHOS 117  BILITOT 0.8  PROT 6.3*  ALBUMIN 2.8*   No results for input(s): LIPASE, AMYLASE in the last 168 hours. No results for input(s): AMMONIA in the last 168 hours. Cardiac Enzymes: Recent Labs  Lab 08/26/18 1131  TROPONINI <0.03   BNP (last 3 results) Recent Labs    08/02/18 1049 08/21/18 1530 08/26/18 1131  BNP 477.9* 314.5* 267.7*   CBG: No results for input(s): GLUCAP in the last 168 hours. Time spent: 35 minutes  Signed:  Berle Mull  [facility] 08/27/2018

## 2018-08-31 NOTE — Progress Notes (Signed)
Subjective:    Patient ID: Evan [name], male    DOB: 08/07/1927, 85 y.o.   MRN: [id number]  HPI Since I last saw the patient, several things have happened.  Patient was on lifelong Lovenox after suffering a pulmonary embolism presumed due to hypercoagulable state brought on by his lung cancer.  The pulmonary embolism occurred while the patient was taking Eliquis prompting the lifelong administration of Lovenox.  However, patient suffered an intracranial hemorrhage as well as a fall.  It is uncertain as to whether the fall led to the hemorrhage or whether the hemorrhage led to the fall.  However due to this, the patient was hospitalized for more than a month in inpatient rehab.  Obviously his Lovenox was discontinued and at the current time he is only taking an aspirin.  Patient has atrial fibrillation as well as a history of a pulmonary embolism but at the present time he has not been cleared to resume any anticoagulant beyond the aspirin.  During that time, the patient lost the use of the right side of his body.  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  Due to the COVID-19 pandemic, his family was unable to visit him in the hospital.  All this is extremely disorienting to an 85 year old individual.  Prior to being hospitalized, the patient was taking Xanax 2-3 times a day.  He had been taking this for more than 15 years.  He came to me on this dosage and I have been his doctor for 12 years.  Patient had longstanding dependency to Xanax and benzodiazepine due to anxiety.  This was obviously discontinued after his fall and intracranial hemorrhage primarily for neuro checks but also to reduce the risk of confusion as the patient was also suffering metabolic encephalopathy.  He has not resumed this medication since he went home.  Since going home from the hospital however EMS has been called at least 3 times due to sudden attacks of dyspnea.  He has been back to the hospital at least  twice once with metabolic encephalopathy due to urinary tract infection as well as subjective dyspnea.  The second time was for dyspnea for unknown causes.  He was treated empirically as a COPD exacerbation but based on the discharge summary the findings were not impressive for COPD as a potential cause.  Currently he is using albuterol in the morning regardless of symptoms and then nothing later in the day.  He continues to have attacks of dyspnea having only been home from the hospital for 3 to 4 days.  Both I and his daughter believe a lot of this is anxiety and panic due to the situation that has occurred and the abrupt discontinuation of his longstanding benzodiazepine. 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   Anginal chest pain at rest [facility])    Chronic non, controlled on Lorazepam   Anxiety    Arthritis    "all over"    BPH (benign prostatic hyperplasia)    CAD (coronary artery disease)    Chronic lower back pain    Complication of anesthesia    "problems making water afterwards"   Depression    DJD (degenerative joint disease)    Esophageal ulcer without bleeding    bid ppi indefinitely   Family history of adverse reaction to anesthesia    "children w/PONV"   GERD (gastroesophageal reflux disease)    Headache    "  couple/week maybe" (09/04/2015)   Hemochromatosis    Possible, elevated iron stores, hook-like osteophytes on hand films, normal LFTs   Hepatitis    "yellow jaundice as a baby"   History of ASCVD    MULTIVESSEL   Hyperlipidemia    Hypertension    Osteoarthritis    Pneumonia    "several times; got a little now" (09/04/2015)   Rheumatoid arthritis ([place])    Subdural hematoma ([place])    small after fall 09/2016-plavix held, neurosurgery consulted.  Asymptomatic.     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[place]) 02/2018   on Lovenox lifelong since he failed PO Eliquis   Past Surgical History:  Procedure Laterality Date   ARM  SKIN LESION BIOPSY / EXCISION Left 09/02/2015   CATARACT EXTRACTION W/ INTRAOCULAR LENS  IMPLANT, BILATERAL Bilateral    CHOLECYSTECTOMY OPEN     CORNEAL TRANSPLANT Bilateral    "one at [facility]; one at [facility]"   [place] W/ STENT     DE stent ostium into the right radial free graft at OM-, 02-2007   ESOPHAGOGASTRODUODENOSCOPY N/A 11/09/2014   Procedure: ESOPHAGOGASTRODUODENOSCOPY (EGD);  Surgeon: [name], MD;  Location: [facility];  Service: Endoscopy;  Laterality: N/A;   INGUINAL HERNIA REPAIR     JOINT REPLACEMENT     LEFT HEART CATH AND CORS/GRAFTS ANGIOGRAPHY N/A 06/27/2017   Procedure: LEFT HEART CATH AND CORS/GRAFTS ANGIOGRAPHY;  Surgeon: [name], MD;  Location: [facility];  Service: Cardiovascular;  Laterality: N/A;   NASAL SINUS SURGERY     SHOULDER OPEN ROTATOR CUFF REPAIR Right    TOTAL KNEE ARTHROPLASTY Bilateral    Current Outpatient Medications on File Prior to Visit  Medication Sig Dispense Refill   acetaminophen (TYLENOL) 325 MG tablet Take 1-2 tablets (325-650 mg total) by mouth every 4 (four) hours as needed for mild pain.     albuterol (VENTOLIN HFA) 108 (90 Base) MCG/ACT inhaler Inhale 2 puffs into the lungs every 4 (four) hours as needed for wheezing or shortness of breath. 1 Inhaler 0   aspirin EC 81 MG EC tablet Take 1 tablet (81 mg total) by mouth daily. (Patient taking differently: Take 81 mg by mouth at bedtime. )     camphor-menthol (SARNA) lotion Apply 1 application topically daily as needed for itching.     furosemide (LASIX) 20 MG tablet Take 1 tablet (20 mg total) by mouth daily. 30 tablet 0   Lifitegrast (XIIDRA) 5 % SOLN Place 1-2 drops into both eyes daily.      magnesium oxide (MAG-OX) 400 (241.3 Mg) MG tablet Take 1 tablet (400 mg total) by mouth 2 (two) times daily. 180 tablet 3   metoprolol tartrate (LOPRESSOR) 25  MG tablet Take 0.5 tablets (12.5 mg total) by mouth 2 (two) times daily. 30 tablet 0   nitroGLYCERIN (NITROSTAT) 0.4 MG SL tablet Place 1 tablet (0.4 mg total) under the tongue every 5 (five) minutes as needed for chest pain. 25 tablet 2   pantoprazole (PROTONIX) 40 MG tablet Take 1 tablet (40 mg total) by mouth every other day. 30 tablet 0   polyethylene glycol (MIRALAX / GLYCOLAX) 17 g packet Take 17 g by mouth daily as needed for mild constipation. 14 each 0   rosuvastatin (CRESTOR) 5 MG tablet Take 1 tablet (5 mg total) by mouth daily at 6 PM. 30 tablet 0   tamsulosin (FLOMAX) 0.4  MG CAPS capsule Take 1 capsule (0.4 mg total) by mouth daily. 30 capsule 0   No current facility-administered medications on file prior to visit.    Allergies  Allergen Reactions   Feldene [Piroxicam] Other (See Comments)    REACTION: Blisters   Statins Other (See Comments)    Myalgias   Doxycycline Other (See Comments)    REACTION:  unknown   Latex Rash   Tequin Anxiety   Valium Other (See Comments)    REACTION:  unknown   Social History   Socioeconomic History   Marital status: Widowed    Spouse name: Not on file   Number of children: Not on file   Years of education: Not on file   Highest education level: Not on file  Occupational History   Not on file  Social Needs   Financial resource strain: Not on file   Food insecurity:    Worry: Not on file    Inability: Not on file   Transportation needs:    Medical: Not on file    Non-medical: Not on file  Tobacco Use   Smoking status: Never Smoker   Smokeless tobacco: Former Systems developer    Types: Chew   Tobacco comment: "quit chewing in the 1960s"  Substance and Sexual Activity   Alcohol use: No   Drug use: No   Sexual activity: Never  Lifestyle   Physical activity:    Days per week: Not on file    Minutes per session: Not on file   Stress: Not on file  Relationships   Social connections:    Talks on phone: Not on  file    Gets together: Not on file    Attends religious service: Not on file    Active member of club or organization: Not on file    Attends meetings of clubs or organizations: Not on file    Relationship status: Not on file   Intimate partner violence:    Fear of current or ex partner: Not on file    Emotionally abused: Not on file    Physically abused: Not on file    Forced sexual activity: Not on file  Other Topics Concern   Not on file  Social History Narrative   01-05-18 Unable to ask abuse questions family with him today.   11-012-19 Unable to ask abuse questions family with him today.      Review of Systems  All other systems reviewed and are negative.      Objective:   Physical Exam Constitutional:      Appearance: He is underweight.  HENT:     Right Ear: Decreased hearing noted.     Left Ear: Decreased hearing noted.     Nose: Nose normal. No congestion or rhinorrhea.  Cardiovascular:     Rate and Rhythm: Bradycardia present. Rhythm irregular.  Pulmonary:     Effort: Pulmonary effort is normal. No respiratory distress.     Breath sounds: No wheezing or rales.  Abdominal:     General: Bowel sounds are normal.     Palpations: Abdomen is soft.  Musculoskeletal:     Right lower leg: No edema.     Left lower leg: No edema.  Neurological:     Mental Status: He is alert and oriented to person, place, and time.     Motor: Weakness present.     Gait: Gait abnormal.  Psychiatric:        Mood and Affect: Mood normal.  Behavior: Behavior normal.   In wheelchair        Assessment & Plan:  Dyspnea, unspecified type  I spent over 30 minutes today with the patient and his daughter reviewing his recent discharge summaries as well as the events that have occurred and transpired over the last 2 months.  This is a difficult situation.  Patient has numerous risk factors for dyspnea including COPD, atrial fibrillation, history of coronary artery disease,  history of a pulmonary embolism, lung cancer with persistent right hilar lymphadenopathy.  However I truly believe the majority of his dyspnea is likely anxiety related for the factors I have outlined in the history of present illness.  I had a long discussion today with his daughter about the risk of resuming Xanax.  The patient is somewhat confused.  He is also in a wheelchair due to weakness and balance issues after his intracranial hemorrhage.  I explained that benzodiazepines can exacerbate confusion.  They can exacerbate falls and balance issues.  However the patient has been rushed to the hospital several times in the last month due to dyspnea with no source identified.  EMS is also been called to the home several times for dyspnea even when the patient is in no apparent respiratory distress.  I believe these are likely panic attacks.  It is apparent to me that the patient is approaching the end of his life.  He has wasted dramatically since his last visit here due to everything that is occurred.  As I explained to his daughter, I would like to focus on his comfort and quality of life.  Despite the risk of resuming benzodiazepines, I do believe this would likely improve his quality of life and lessen his subjective dyspnea.  Therefore I gave the patient a prescription for Xanax 0.5 mg.  Have instructed his daughter to give him 1/2 tablet every 8 hours as needed for panic or dyspnea.  We will also try the patient on Anoro 1 inhalation a day in case COPD is playing a role in his shortness of breath.  I also recommended they reduce his metoprolol to 1/2 tablet daily due to bradycardia on his exam today and low blood pressure and in 1 week continue the medication altogether as long as his heart rate remains low and his blood pressure remains low.

## 2018-09-03 ENCOUNTER — Other Ambulatory Visit: Payer: Self-pay

## 2018-09-03 DIAGNOSIS — I4891 Unspecified atrial fibrillation: Secondary | ICD-10-CM | POA: Diagnosis not present

## 2018-09-03 DIAGNOSIS — Z86718 Personal history of other venous thrombosis and embolism: Secondary | ICD-10-CM | POA: Diagnosis not present

## 2018-09-03 DIAGNOSIS — C3431 Malignant neoplasm of lower lobe, right bronchus or lung: Secondary | ICD-10-CM | POA: Diagnosis not present

## 2018-09-03 DIAGNOSIS — I2510 Atherosclerotic heart disease of native coronary artery without angina pectoris: Secondary | ICD-10-CM | POA: Diagnosis not present

## 2018-09-03 DIAGNOSIS — J449 Chronic obstructive pulmonary disease, unspecified: Secondary | ICD-10-CM | POA: Diagnosis not present

## 2018-09-03 DIAGNOSIS — I13 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 failure and stage 1 through stage 4 chronic kidney disease, or unspecified chronic kidney disease: Secondary | ICD-10-CM | POA: Diagnosis not present

## 2018-09-03 DIAGNOSIS — I69218 Other symptoms and signs involving cognitive functions following other nontraumatic intracranial hemorrhage: Secondary | ICD-10-CM | POA: Diagnosis not present

## 2018-09-03 DIAGNOSIS — N183 Chronic kidney disease, stage 3 (moderate): Secondary | ICD-10-CM | POA: Diagnosis not present

## 2018-09-03 DIAGNOSIS — I509 Heart failure, unspecified: Secondary | ICD-10-CM | POA: Diagnosis not present

## 2018-09-03 NOTE — Patient Outreach (Signed)
[facility]) Care Management  09/03/2018  Evan [name] July 10, 1927 [id number]  EMMI: general discharge red alert Referral date: 08/28/18 Referral reason: Know who to call about changes in condition: No,  Scheduled follow up: NO Insurance:  [employer] Day # 1  Attempt #1  Telephone call to patient's daughter, Evan [name]. Unable to reach or leave voice message.  Message state," memory full."    PLAN: RNCM will attempt 2nd telephone call to patient's daughter within 4 business days.  RNCm will send outreach letter to patient to attempt contact.   Quinn Plowman RN,BSN,CCM [facility] Telephonic  [phone]

## 2018-09-04 ENCOUNTER — Telehealth: Payer: Self-pay | Admitting: Nurse Practitioner

## 2018-09-04 DIAGNOSIS — I4891 Unspecified atrial fibrillation: Secondary | ICD-10-CM | POA: Diagnosis not present

## 2018-09-04 DIAGNOSIS — I13 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 failure and stage 1 through stage 4 chronic kidney disease, or unspecified chronic kidney disease: Secondary | ICD-10-CM | POA: Diagnosis not present

## 2018-09-04 DIAGNOSIS — I69218 Other symptoms and signs involving cognitive functions following other nontraumatic intracranial hemorrhage: Secondary | ICD-10-CM | POA: Diagnosis not present

## 2018-09-04 DIAGNOSIS — I509 Heart failure, unspecified: Secondary | ICD-10-CM | POA: Diagnosis not present

## 2018-09-04 DIAGNOSIS — Z86718 Personal history of other venous thrombosis and embolism: Secondary | ICD-10-CM | POA: Diagnosis not present

## 2018-09-04 DIAGNOSIS — J449 Chronic obstructive pulmonary disease, unspecified: Secondary | ICD-10-CM | POA: Diagnosis not present

## 2018-09-04 DIAGNOSIS — I2510 Atherosclerotic heart disease of native coronary artery without angina pectoris: Secondary | ICD-10-CM | POA: Diagnosis not present

## 2018-09-04 DIAGNOSIS — N183 Chronic kidney disease, stage 3 (moderate): Secondary | ICD-10-CM | POA: Diagnosis not present

## 2018-09-04 DIAGNOSIS — C3431 Malignant neoplasm of lower lobe, right bronchus or lung: Secondary | ICD-10-CM | POA: Diagnosis not present

## 2018-09-04 NOTE — Telephone Encounter (Signed)
Talked with patient's daughter [name] and have scheduled a Telephone Palliative consult (declined telehealth) for 09/25/18 @ 9 am.

## 2018-09-05 ENCOUNTER — Other Ambulatory Visit: Payer: Self-pay

## 2018-09-05 DIAGNOSIS — I69218 Other symptoms and signs involving cognitive functions following other nontraumatic intracranial hemorrhage: Secondary | ICD-10-CM | POA: Diagnosis not present

## 2018-09-05 DIAGNOSIS — I2510 Atherosclerotic heart disease of native coronary artery without angina pectoris: Secondary | ICD-10-CM | POA: Diagnosis not present

## 2018-09-05 DIAGNOSIS — N390 Urinary tract infection, site not specified: Secondary | ICD-10-CM | POA: Diagnosis not present

## 2018-09-05 DIAGNOSIS — I4891 Unspecified atrial fibrillation: Secondary | ICD-10-CM | POA: Diagnosis not present

## 2018-09-05 DIAGNOSIS — N183 Chronic kidney disease, stage 3 (moderate): Secondary | ICD-10-CM | POA: Diagnosis not present

## 2018-09-05 DIAGNOSIS — J449 Chronic obstructive pulmonary disease, unspecified: Secondary | ICD-10-CM | POA: Diagnosis not present

## 2018-09-05 DIAGNOSIS — I509 Heart failure, unspecified: Secondary | ICD-10-CM | POA: Diagnosis not present

## 2018-09-05 DIAGNOSIS — C3431 Malignant neoplasm of lower lobe, right bronchus or lung: Secondary | ICD-10-CM | POA: Diagnosis not present

## 2018-09-05 DIAGNOSIS — Z86718 Personal history of other venous thrombosis and embolism: Secondary | ICD-10-CM | POA: Diagnosis not present

## 2018-09-05 DIAGNOSIS — I13 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 failure and stage 1 through stage 4 chronic kidney disease, or unspecified chronic kidney disease: Secondary | ICD-10-CM | POA: Diagnosis not present

## 2018-09-05 NOTE — Patient Outreach (Signed)
        [facility]) Care Management  09/05/2018  Evan [name] 09-18-1927 [id number]  EMMI:general discharge red alert Referral date:08/28/18 Referral reason:Know who to call about changes in condition: No, Scheduled follow up: NO Insurance: [employer] Day #1  Telephone call to patient regarding EMMI general red alert. HIPAA verified by daughter, [name].  Daughter states patients primary MD advised that patient could wear his oxygen as needed.  Daughter reports patient was able to restart his anxiety medication and his primary MD referred patient to a cardiologist.  Daughter states sore to patients heel and buttocks are healing. She reports the home health nurse continues to follow regarding this.  Daughter states patient is to follow up with his doctor as needed. No follow up appointment is scheduled at this time. Daughter denies any further needs for patient at this time.  RNCM confirmed daughter has contact phone number for 24 hour nurse advice line.    PLAN; RNCM will close due to patient being assessed and having no further needs.   Quinn Plowman RN,BSN,CCM [facility] Telephonic  [phone]

## 2018-09-06 DIAGNOSIS — I2510 Atherosclerotic heart disease of native coronary artery without angina pectoris: Secondary | ICD-10-CM | POA: Diagnosis not present

## 2018-09-06 DIAGNOSIS — I13 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 failure and stage 1 through stage 4 chronic kidney disease, or unspecified chronic kidney disease: Secondary | ICD-10-CM | POA: Diagnosis not present

## 2018-09-06 DIAGNOSIS — J449 Chronic obstructive pulmonary disease, unspecified: Secondary | ICD-10-CM | POA: Diagnosis not present

## 2018-09-06 DIAGNOSIS — I509 Heart failure, unspecified: Secondary | ICD-10-CM | POA: Diagnosis not present

## 2018-09-06 DIAGNOSIS — C3431 Malignant neoplasm of lower lobe, right bronchus or lung: Secondary | ICD-10-CM | POA: Diagnosis not present

## 2018-09-06 DIAGNOSIS — N183 Chronic kidney disease, stage 3 (moderate): Secondary | ICD-10-CM | POA: Diagnosis not present

## 2018-09-06 DIAGNOSIS — I4891 Unspecified atrial fibrillation: Secondary | ICD-10-CM | POA: Diagnosis not present

## 2018-09-06 DIAGNOSIS — I69218 Other symptoms and signs involving cognitive functions following other nontraumatic intracranial hemorrhage: Secondary | ICD-10-CM | POA: Diagnosis not present

## 2018-09-06 DIAGNOSIS — Z86718 Personal history of other venous thrombosis and embolism: Secondary | ICD-10-CM | POA: Diagnosis not present

## 2018-09-10 DIAGNOSIS — N183 Chronic kidney disease, stage 3 (moderate): Secondary | ICD-10-CM | POA: Diagnosis not present

## 2018-09-10 DIAGNOSIS — I4891 Unspecified atrial fibrillation: Secondary | ICD-10-CM | POA: Diagnosis not present

## 2018-09-10 DIAGNOSIS — I69218 Other symptoms and signs involving cognitive functions following other nontraumatic intracranial hemorrhage: Secondary | ICD-10-CM | POA: Diagnosis not present

## 2018-09-10 DIAGNOSIS — J449 Chronic obstructive pulmonary disease, unspecified: Secondary | ICD-10-CM | POA: Diagnosis not present

## 2018-09-10 DIAGNOSIS — I509 Heart failure, unspecified: Secondary | ICD-10-CM | POA: Diagnosis not present

## 2018-09-10 DIAGNOSIS — C3431 Malignant neoplasm of lower lobe, right bronchus or lung: Secondary | ICD-10-CM | POA: Diagnosis not present

## 2018-09-10 DIAGNOSIS — I2510 Atherosclerotic heart disease of native coronary artery without angina pectoris: Secondary | ICD-10-CM | POA: Diagnosis not present

## 2018-09-10 DIAGNOSIS — Z86718 Personal history of other venous thrombosis and embolism: Secondary | ICD-10-CM | POA: Diagnosis not present

## 2018-09-10 DIAGNOSIS — I13 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 failure and stage 1 through stage 4 chronic kidney disease, or unspecified chronic kidney disease: Secondary | ICD-10-CM | POA: Diagnosis not present

## 2018-09-11 ENCOUNTER — Encounter (HOSPITAL_COMMUNITY): Payer: Self-pay | Admitting: Emergency Medicine

## 2018-09-11 ENCOUNTER — Emergency Department (HOSPITAL_BASED_OUTPATIENT_CLINIC_OR_DEPARTMENT_OTHER): Payer: Medicare HMO

## 2018-09-11 ENCOUNTER — Emergency Department (HOSPITAL_COMMUNITY)
Admission: EM | Admit: 2018-09-11 | Discharge: 2018-09-11 | Disposition: A | Discharge status: Home / Self Care | Payer: Medicare HMO | Attending: Emergency Medicine | Admitting: Emergency Medicine

## 2018-09-11 DIAGNOSIS — I13 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 failure and stage 1 through stage 4 chronic kidney disease, or unspecified chronic kidney disease: Secondary | ICD-10-CM | POA: Diagnosis not present

## 2018-09-11 DIAGNOSIS — M069 Rheumatoid arthritis, unspecified: Secondary | ICD-10-CM | POA: Insufficient documentation

## 2018-09-11 DIAGNOSIS — Z85118 Personal history of other malignant neoplasm of bronchus and lung: Secondary | ICD-10-CM | POA: Diagnosis not present

## 2018-09-11 DIAGNOSIS — I69218 Other symptoms and signs involving cognitive functions following other nontraumatic intracranial hemorrhage: Secondary | ICD-10-CM | POA: Diagnosis not present

## 2018-09-11 DIAGNOSIS — M79672 Pain in left foot: Secondary | ICD-10-CM | POA: Diagnosis not present

## 2018-09-11 DIAGNOSIS — I2510 Atherosclerotic heart disease of native coronary artery without angina pectoris: Secondary | ICD-10-CM | POA: Diagnosis not present

## 2018-09-11 DIAGNOSIS — Z87891 Personal history of nicotine dependence: Secondary | ICD-10-CM | POA: Insufficient documentation

## 2018-09-11 DIAGNOSIS — Z86718 Personal history of other venous thrombosis and embolism: Secondary | ICD-10-CM | POA: Insufficient documentation

## 2018-09-11 DIAGNOSIS — C3431 Malignant neoplasm of lower lobe, right bronchus or lung: Secondary | ICD-10-CM | POA: Diagnosis not present

## 2018-09-11 DIAGNOSIS — I10 Essential (primary) hypertension: Secondary | ICD-10-CM | POA: Diagnosis not present

## 2018-09-11 DIAGNOSIS — R609 Edema, unspecified: Secondary | ICD-10-CM | POA: Diagnosis not present

## 2018-09-11 DIAGNOSIS — Z7982 Long term (current) use of aspirin: Secondary | ICD-10-CM | POA: Insufficient documentation

## 2018-09-11 DIAGNOSIS — J449 Chronic obstructive pulmonary disease, unspecified: Secondary | ICD-10-CM | POA: Diagnosis not present

## 2018-09-11 DIAGNOSIS — L539 Erythematous condition, unspecified: Secondary | ICD-10-CM | POA: Diagnosis not present

## 2018-09-11 DIAGNOSIS — I509 Heart failure, unspecified: Secondary | ICD-10-CM | POA: Diagnosis not present

## 2018-09-11 DIAGNOSIS — Z91040 Latex allergy status: Secondary | ICD-10-CM | POA: Diagnosis not present

## 2018-09-11 DIAGNOSIS — N183 Chronic kidney disease, stage 3 (moderate): Secondary | ICD-10-CM | POA: Diagnosis not present

## 2018-09-11 DIAGNOSIS — Z79899 Other long term (current) drug therapy: Secondary | ICD-10-CM | POA: Insufficient documentation

## 2018-09-11 DIAGNOSIS — I4891 Unspecified atrial fibrillation: Secondary | ICD-10-CM | POA: Diagnosis not present

## 2018-09-11 LAB — CBC WITH DIFFERENTIAL/PLATELET
Abs Immature Granulocytes: 0.04 10*3/uL (ref 0.00–0.07)
Basophils Absolute: 0 10*3/uL (ref 0.0–0.1)
Basophils Relative: 0 %
Eosinophils Absolute: 0.3 10*3/uL (ref 0.0–0.5)
Eosinophils Relative: 3 %
HCT: 34.9 % — ABNORMAL LOW (ref 39.0–52.0)
Hemoglobin: 11.3 g/dL — ABNORMAL LOW (ref 13.0–17.0)
Immature Granulocytes: 0 %
Lymphocytes Relative: 20 %
Lymphs Abs: 1.7 10*3/uL (ref 0.7–4.0)
MCH: 30.6 pg (ref 26.0–34.0)
MCHC: 32.4 g/dL (ref 30.0–36.0)
MCV: 94.6 fL (ref 80.0–100.0)
Monocytes Absolute: 1.2 10*3/uL — ABNORMAL HIGH (ref 0.1–1.0)
Monocytes Relative: 13 %
Neutro Abs: 5.7 10*3/uL (ref 1.7–7.7)
Neutrophils Relative %: 64 %
Platelets: 124 10*3/uL — ABNORMAL LOW (ref 150–400)
RBC: 3.69 MIL/uL — ABNORMAL LOW (ref 4.22–5.81)
RDW: 15.2 % (ref 11.5–15.5)
WBC: 8.9 10*3/uL (ref 4.0–10.5)
nRBC: 0 % (ref 0.0–0.2)

## 2018-09-11 LAB — BASIC METABOLIC PANEL
Anion gap: 9 (ref 5–15)
BUN: 15 mg/dL (ref 8–23)
CO2: 22 mmol/L (ref 22–32)
Calcium: 8.9 mg/dL (ref 8.9–10.3)
Chloride: 105 mmol/L (ref 98–111)
Creatinine, Ser: 1.2 mg/dL (ref 0.61–1.24)
GFR calc Af Amer: >60 mL/min (ref >60)
GFR calc non Af Amer: 53 mL/min — ABNORMAL LOW (ref >60)
Glucose, Bld: 95 mg/dL (ref 70–99)
Potassium: 3.8 mmol/L (ref 3.5–5.1)
Sodium: 136 mmol/L (ref 135–145)

## 2018-09-11 LAB — URIC ACID: Uric Acid, Serum: 6.6 mg/dL (ref 3.7–8.6)

## 2018-09-11 MED ORDER — ACETAMINOPHEN 500 MG PO TABS
1000.0000 mg | ORAL_TABLET | Freq: Once | ORAL | Status: DC
  Filled 2018-09-11: qty 2

## 2018-09-11 MED ORDER — ACETAMINOPHEN 325 MG PO TABS: 650.0000 mg | ORAL_TABLET | Freq: Once | ORAL | Status: DC

## 2018-09-11 NOTE — Discharge Instructions (Addendum)
Contact a health care provider if you have: Another gout attack. Continuing symptoms of a gout attack after 10 days of treatment. Side effects from your medicines. Chills or a fever. Burning pain when you urinate. Pain in your lower back or belly. Get help right away if you: Have severe or uncontrolled pain. Cannot urinate.

## 2018-09-11 NOTE — ED Triage Notes (Signed)
Pt here from with home with c/o left foot pain and swelling

## 2018-09-11 NOTE — Progress Notes (Signed)
LLE venous duplex       has been completed. Preliminary results can be found under CV proc through chart review. Decklyn Hornik, BS, RDMS, RVT    

## 2018-09-11 NOTE — ED Provider Notes (Signed)
[facility] EMERGENCY DEPARTMENT Provider Note   CSN: [id number] Arrival date & time: 09/11/18  1134    History   Chief Complaint Chief Complaint  Patient presents with  . Foot Pain    HPI Evan [name] is a 85 y.o. male who presents with cc of foot pain.  Pain began about 1 week ago.  His daughter who is at the bedside states that yesterday his foot became very red and painful.  Patient states that it hurts but is much worse when he tries to ambulate on the foot.  His daughter also states that he has a history of DVT and she was concerned that he might have 1.  Patient describes the pain as heavy and aching.  He denies chest pain or shortness of breath.  His daughter states that he is unable to take anticoagulation therapy because of previous spontaneous intracranial hemorrhage.  Patient denies any injuries.     HPI  Past Medical History:  Diagnosis Date  . Adenocarcinoma of right lung ([place]) 01/06/2016  . Anginal chest pain at rest [facility])    Chronic non, controlled on Lorazepam  . Anxiety   . Arthritis    "all over"   . BPH (benign prostatic hyperplasia)   . CAD (coronary artery disease)   . Chronic lower back pain   . Complication of anesthesia    "problems making water afterwards"  . Depression   . DJD (degenerative joint disease)   . Esophageal ulcer without bleeding    bid ppi indefinitely  . Family history of adverse reaction to anesthesia    "children w/PONV"  . GERD (gastroesophageal reflux disease)   . Headache    "couple/week maybe" (09/04/2015)  . Hemochromatosis    Possible, elevated iron stores, hook-like osteophytes on hand films, normal LFTs  . Hepatitis    "yellow jaundice as a baby"  . History of ASCVD    MULTIVESSEL  . Hyperlipidemia   . Hypertension   . Osteoarthritis   . Pneumonia    "several times; got a little now" (09/04/2015)  . Rheumatoid arthritis ([place])   . Subdural hematoma (H[facility])    small after fall 09/2016-plavix held,  neurosurgery consulted.  Asymptomatic.    Marland Kitchen 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[place]) 02/2018   on Lovenox lifelong since he failed PO Eliquis    Patient Active Problem List   Diagnosis Date Noted  . Pressure injury of skin 08/27/2018  . COPD (chronic obstructive pulmonary disease) ([place]) 08/26/2018  . AMS (altered mental status) 08/22/2018  . Altered mental status 08/21/2018  . UTI (urinary tract infection) 08/21/2018  . Abnormality of gait 08/20/2018  . Prolonged Q-T interval on ECG   . Dysphagia, post-stroke   . Supplemental oxygen dependent   . Anxiety state   . Fatigue   . SOB (shortness of breath)   . Hyperglycemia   . History of lung cancer   . Depression   . CKD (chronic kidney disease), stage III ([place])   . Acute blood loss anemia   . Fall 07/16/2018  . 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 Acute spontaneous 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 ([place]) L thalamic 07/14/2018  . Cellulitis of arm, right 06/01/2018  . Rash and nonspecific skin eruption 06/01/2018  . Chest pain 02/02/2018  . Thrombocytopenia ([place]) 02/02/2018  . Hypokalemia 02/02/2018  . Pulmonary embolism ([place]) 02/02/2018  . Elevated troponin 02/02/2018  . Pulmonary embolus ([place]) 02/02/2018  . Non-ST elevation  MI (NSTEMI) ([place]) 06/27/2017  . Diastolic CHF ([place]) 32/67/1245  . Chest pain due to CAD ([place]) 06/26/2017  . Dyspnea 06/21/2017  . Acute bronchitis 05/15/2017  . Oral candidiasis 05/15/2017  . Acute pericarditis   . Chest pain at rest 12/20/2016  . Angina pectoris ([place]) 12/20/2016  . Radiation pneumonitis ([place]) 11/09/2016  . Pleurisy 10/11/2016  . Chest wall pain 10/11/2016  . Atrial fibrillation ([place]) 05/21/2016  . Adenocarcinoma of right lung ([place]) 01/06/2016  . GAD (generalized anxiety disorder) 11/20/2015  . Essential hypertension   . Cellulitis 09/04/2015  . Cellulitis of left axilla   . Esophageal ulcer without bleeding   .  Bilateral lower extremity edema 04/28/2014  . BPH (benign prostatic hyperplasia) 10/08/2012  . Anxiety   . EKG abnormalities 09/05/2012  . Tremor 09/05/2012  . Chronic fatigue 09/05/2012  . Arthritis   . Asthma, chronic   . Reflux esophagitis   . S/P CABG (coronary artery bypass graft)   . GERD (gastroesophageal reflux disease)   . Hyperlipidemia   . Hypertension   . UNSPECIFIED PERIPHERAL VASCULAR DISEASE 06/16/2009    Past Surgical History:  Procedure Laterality Date  . ARM SKIN LESION BIOPSY / EXCISION Left 09/02/2015  . CATARACT EXTRACTION W/ INTRAOCULAR LENS  IMPLANT, BILATERAL Bilateral   . CHOLECYSTECTOMY OPEN    . CORNEAL TRANSPLANT Bilateral    "one at [facility]; one at [facility]"  . CORONARY ANGIOPLASTY WITH STENT PLACEMENT    . CORONARY ARTERY BYPASS GRAFT  1999  . DESCENDING AORTIC ANEURYSM REPAIR W/ STENT     DE stent ostium into the right radial free graft at OM-, 02-2007  . ESOPHAGOGASTRODUODENOSCOPY N/A 11/09/2014   Procedure: ESOPHAGOGASTRODUODENOSCOPY (EGD);  Surgeon: [name], MD;  Location: [facility] ENDOSCOPY;  Service: Endoscopy;  Laterality: N/A;  . INGUINAL HERNIA REPAIR    . JOINT REPLACEMENT    . LEFT HEART CATH AND CORS/GRAFTS ANGIOGRAPHY N/A 06/27/2017   Procedure: LEFT HEART CATH AND CORS/GRAFTS ANGIOGRAPHY;  Surgeon: [name], MD;  Location: [facility] CV LAB;  Service: Cardiovascular;  Laterality: N/A;  . NASAL SINUS SURGERY    . SHOULDER OPEN ROTATOR CUFF REPAIR Right   . TOTAL KNEE ARTHROPLASTY Bilateral         Home Medications    Prior to Admission medications   Medication Sig Start Date End Date Taking? Authorizing Provider  acetaminophen (TYLENOL) 325 MG tablet Take 1-2 tablets (325-650 mg total) by mouth every 4 (four) hours as needed for mild pain. 08/09/18   Love, Ivan Anchors, PA-C  albuterol (VENTOLIN HFA) 108 (90 Base) MCG/ACT inhaler Inhale 2 puffs into the lungs every 4 (four) hours as needed for wheezing or shortness of breath. 08/24/18    Aline August, MD  ALPRAZolam Duanne Moron) 0.5 MG tablet Take 0.5 tablets (0.25 mg total) by mouth 3 (three) times daily as needed for anxiety. 08/31/18   [name], MD  aspirin EC 81 MG EC tablet Take 1 tablet (81 mg total) by mouth daily. Patient taking differently: Take 81 mg by mouth at bedtime.  08/14/18   Angiulli, Lavon Paganini, PA-C  camphor-menthol [facility]) lotion Apply 1 application topically daily as needed for itching. 08/24/18   Aline August, MD  furosemide (LASIX) 20 MG tablet Take 1 tablet (20 mg total) by mouth daily. 08/14/18   Angiulli, Lavon Paganini, PA-C  Lifitegrast Shirley Friar) 5 % SOLN Place 1-2 drops into both eyes daily.     [provider]  magnesium oxide (MAG-OX) 400 (  241.3 Mg) MG tablet Take 1 tablet (400 mg total) by mouth 2 (two) times daily. 08/14/18   Angiulli, Lavon Paganini, PA-C  metoprolol tartrate (LOPRESSOR) 25 MG tablet Take 0.5 tablets (12.5 mg total) by mouth 2 (two) times daily. 08/14/18   Angiulli, Lavon Paganini, PA-C  nitroGLYCERIN (NITROSTAT) 0.4 MG SL tablet Place 1 tablet (0.4 mg total) under the tongue every 5 (five) minutes as needed for chest pain. 12/06/17   Daune Perch, NP  pantoprazole (PROTONIX) 40 MG tablet Take 1 tablet (40 mg total) by mouth every other day. 08/14/18   Angiulli, Lavon Paganini, PA-C  polyethylene glycol (MIRALAX / GLYCOLAX) 17 g packet Take 17 g by mouth daily as needed for mild constipation. 08/14/18   Angiulli, Lavon Paganini, PA-C  rosuvastatin (CRESTOR) 5 MG tablet Take 1 tablet (5 mg total) by mouth daily at 6 PM. 08/14/18   Angiulli, Lavon Paganini, PA-C  tamsulosin (FLOMAX) 0.4 MG CAPS capsule Take 1 capsule (0.4 mg total) by mouth daily. 08/14/18   Angiulli, Lavon Paganini, PA-C    Family History Family History  Problem Relation Age of Onset  . Arthritis-Osteo Sister   . Heart attack Brother   . Heart attack Other   . Cancer Neg Hx     Social History Social History   Tobacco Use  . Smoking status: Never Smoker  . Smokeless tobacco: Former Systems developer     Types: Chew  . Tobacco comment: "quit chewing in the 1960s"  Substance Use Topics  . Alcohol use: No  . Drug use: No     Allergies   Feldene [piroxicam]; Statins; Doxycycline; Latex; Tequin; and Valium   Review of Systems Review of Systems  Ten systems reviewed and are negative for acute change, except as noted in the HPI.   Physical Exam Updated Vital Signs There were no vitals taken for this visit.  Physical Exam Vitals signs and nursing note reviewed.  Constitutional:      General: He is not in acute distress.    Appearance: He is well-developed. He is not diaphoretic.  HENT:     Head: Normocephalic and atraumatic.  Eyes:     General: No scleral icterus.    Conjunctiva/sclera: Conjunctivae normal.  Neck:     Musculoskeletal: Normal range of motion and neck supple.  Cardiovascular:     Rate and Rhythm: Normal rate and regular rhythm.     Heart sounds: Normal heart sounds.  Pulmonary:     Effort: Pulmonary effort is normal. No respiratory distress.     Breath sounds: Normal breath sounds.  Abdominal:     Palpations: Abdomen is soft.     Tenderness: There is no abdominal tenderness.  Musculoskeletal:     Comments: Swelling of the left foot ankle to the mid calf.  2+ pitting edema.  There is erythema and tenderness along the metatarsophalangeal joint line.  Pain is worse on the plantar surface.  Normal DP and PT pulses.  Sensation normal and equal to light touch.  Left foot warm.  Skin:    General: Skin is warm and dry.  Neurological:     Mental Status: He is alert.  Psychiatric:        Behavior: Behavior normal.      ED Treatments / Results  Labs (all labs ordered are listed, but only abnormal results are displayed) Labs Reviewed - No data to display  EKG None  Radiology No results found.  Procedures Procedures (including critical care time)  Medications Ordered  in ED Medications  acetaminophen (TYLENOL) tablet 650 mg (has no administration in  time range)     Initial Impression / Assessment and Plan / ED Course  I have reviewed the triage vital signs and the nursing notes.  Pertinent labs & imaging results that were available during my care of the patient were reviewed by me and considered in my medical decision making (see chart for details).        85 year old male with left midfoot pain and erythema.  No known injuries, white count is normal, afebrile and hemodynamically stable.  I personally reviewed the patient's DVT study of the left leg which shows no deep vein thromboses. Patient's uric acid level normal, BMP is without significant abnormality.  Patient most likely has gout.  I is low risk for development of septic joint.  I discussed the case with Dr. Venora Maples and patient was seen and shared visit.  I discussed reasons to seek immediate medical care and return to the emergency department with the patient and his daughter.  Patient appears appropriate for discharge at this time.  Will treat with Tylenol per wishes of his primary care physician and patient is to follow closely with podiatry or return for worsening symptoms. Final Clinical Impressions(s) / ED Diagnoses   Final diagnoses:  None    ED Discharge Orders    None       Margarita Mail, PA-C 09/11/18 1616    Jola Schmidt, MD 09/14/18 1335

## 2018-09-12 ENCOUNTER — Telehealth: Payer: Self-pay | Admitting: *Deleted

## 2018-09-12 DIAGNOSIS — Z86718 Personal history of other venous thrombosis and embolism: Secondary | ICD-10-CM | POA: Diagnosis not present

## 2018-09-12 DIAGNOSIS — I4891 Unspecified atrial fibrillation: Secondary | ICD-10-CM | POA: Diagnosis not present

## 2018-09-12 DIAGNOSIS — I2510 Atherosclerotic heart disease of native coronary artery without angina pectoris: Secondary | ICD-10-CM | POA: Diagnosis not present

## 2018-09-12 DIAGNOSIS — J449 Chronic obstructive pulmonary disease, unspecified: Secondary | ICD-10-CM | POA: Diagnosis not present

## 2018-09-12 DIAGNOSIS — I13 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 failure and stage 1 through stage 4 chronic kidney disease, or unspecified chronic kidney disease: Secondary | ICD-10-CM | POA: Diagnosis not present

## 2018-09-12 DIAGNOSIS — I509 Heart failure, unspecified: Secondary | ICD-10-CM | POA: Diagnosis not present

## 2018-09-12 DIAGNOSIS — N183 Chronic kidney disease, stage 3 (moderate): Secondary | ICD-10-CM | POA: Diagnosis not present

## 2018-09-12 DIAGNOSIS — I69218 Other symptoms and signs involving cognitive functions following other nontraumatic intracranial hemorrhage: Secondary | ICD-10-CM | POA: Diagnosis not present

## 2018-09-12 DIAGNOSIS — C3431 Malignant neoplasm of lower lobe, right bronchus or lung: Secondary | ICD-10-CM | POA: Diagnosis not present

## 2018-09-12 NOTE — Telephone Encounter (Signed)
Received call from [place], [facility] with [facility].   Reports that patient has 2x new ST to R arm. States that she would like to proceed with Xeroform to areas.   VO given.

## 2018-09-13 ENCOUNTER — Other Ambulatory Visit: Payer: Self-pay

## 2018-09-13 ENCOUNTER — Ambulatory Visit (INDEPENDENT_AMBULATORY_CARE_PROVIDER_SITE_OTHER): Payer: Medicare HMO | Admitting: Adult Health

## 2018-09-13 DIAGNOSIS — E785 Hyperlipidemia, unspecified: Secondary | ICD-10-CM | POA: Diagnosis not present

## 2018-09-13 DIAGNOSIS — I69251 Hemiplegia and hemiparesis following other nontraumatic intracranial hemorrhage affecting right dominant side: Secondary | ICD-10-CM | POA: Diagnosis not present

## 2018-09-13 DIAGNOSIS — I610 Nontraumatic intracerebral hemorrhage in hemisphere, subcortical: Secondary | ICD-10-CM | POA: Diagnosis not present

## 2018-09-13 DIAGNOSIS — I10 Essential (primary) hypertension: Secondary | ICD-10-CM | POA: Diagnosis not present

## 2018-09-13 DIAGNOSIS — J449 Chronic obstructive pulmonary disease, unspecified: Secondary | ICD-10-CM | POA: Diagnosis not present

## 2018-09-13 DIAGNOSIS — I13 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 failure and stage 1 through stage 4 chronic kidney disease, or unspecified chronic kidney disease: Secondary | ICD-10-CM | POA: Diagnosis not present

## 2018-09-13 DIAGNOSIS — I2510 Atherosclerotic heart disease of native coronary artery without angina pectoris: Secondary | ICD-10-CM | POA: Diagnosis not present

## 2018-09-13 DIAGNOSIS — N183 Chronic kidney disease, stage 3 (moderate): Secondary | ICD-10-CM | POA: Diagnosis not present

## 2018-09-13 DIAGNOSIS — M79671 Pain in right foot: Secondary | ICD-10-CM | POA: Diagnosis not present

## 2018-09-13 DIAGNOSIS — I480 Paroxysmal atrial fibrillation: Secondary | ICD-10-CM

## 2018-09-13 DIAGNOSIS — Z86718 Personal history of other venous thrombosis and embolism: Secondary | ICD-10-CM

## 2018-09-13 DIAGNOSIS — C3431 Malignant neoplasm of lower lobe, right bronchus or lung: Secondary | ICD-10-CM | POA: Diagnosis not present

## 2018-09-13 DIAGNOSIS — L539 Erythematous condition, unspecified: Secondary | ICD-10-CM

## 2018-09-13 DIAGNOSIS — I4891 Unspecified atrial fibrillation: Secondary | ICD-10-CM | POA: Diagnosis not present

## 2018-09-13 DIAGNOSIS — I509 Heart failure, unspecified: Secondary | ICD-10-CM | POA: Diagnosis not present

## 2018-09-13 DIAGNOSIS — I69218 Other symptoms and signs involving cognitive functions following other nontraumatic intracranial hemorrhage: Secondary | ICD-10-CM | POA: Diagnosis not present

## 2018-09-13 NOTE — Telephone Encounter (Signed)
ok 

## 2018-09-13 NOTE — Progress Notes (Signed)
[facility] [address] [place]. [place] [address] [phone]       VIRTUAL VISIT FOLLOW UP NOTE  [name] Date of Birth:  [birth year] Medical Record Number:  [id number]   Reason for Referral:  hospital stroke follow up    Virtual Visit via Video Note  I connected with Evan [name] on 09/13/18 at  9:45 AM EDT by a video enabled telemedicine application located remotely in my own home and verified that I am speaking with the correct person using two identifiers who was located at their own home accompanied by his family members.   Visit scheduled by Katharine Look, RN. She discussed the limitations of evaluation and management by telemedicine and the availability of in person appointments. The patient expressed understanding and agreed to proceed.Please see telephone note for additional scheduling information and consent.    CHIEF COMPLAINT:  Chief Complaint  Patient presents with   Follow-up    Hospital stroke follow-up    HPI: Evan [name] was initially scheduled today for in office hospital follow-up regarding small ICH and left thalamus likely hypertensive on 07/13/2018 but due to COVID-19 safety precautions, visit transition to telemedicine via [email or web address] with patients consent. History obtained from patient, family members and chart review. Reviewed all radiology images and labs personally.  Mr.Evan G Brownis a 85 y.o.malewith history of adenocarcinoma of the lung in remission, Htn, Hld, Hemochromatosis, CAD with stent, atrial fibrillation and PE on therapeutic Lovenox (failed eliquis), and hearing impairedfound on the floor by family with right hemiparesis. 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  Initial CT imaging showed acute intraparenchymal hemorrhage centered at the left thalamus therefore hedid not receive IV t-PA due to [place].  CTA head and neck  showed atheromatous change about the carotid bifurcations with up to 50% narrowing on the right and 65% narrowing in the left, moderate arthrosclerosis bilateral siphon, L>R, and overall diminutive vertebrobasilar system.  Repeat CT head showed stable hemorrhage left thalamus without change.  2D echo unremarkable.  Discontinued Lovenox and no recommendation of antithrombotic given hemorrhage.  HTN stable.  HLD resume Crestor.  Other stroke risk factors include advanced age and CAD.  Prior history of fall with SDH in 2018.  Residual left hemiparesis and discharged to CIR for ongoing therapies.  He has been stable from a stroke standpoint with residual right hemiparesis with mild improvement. He continues to participate in home therapies. He in nonambulatory but able to stand with RW for about 20 seconds. Daughter reports generalized lower extremity weakness but right > left. He unfortunately presented to ED on 08/26/2018 with complaints of shortness of breath and prior admission on 08/21/2018 for acute metabolic encephalopathy attributed to E. coli UTI and hypoxia.  Recommended discharge to SNF but family refused and referral to palliative care with possibility of transitioning to hospice. Currently awaiting initial palliative consult. Continues on aspirin with bleeding or bruising. Crestor for cholesterol without myalgais. Blood pressure stable typically around 100-110/60. Daughter does endorse right foot pain with redness on top of foot. No swelling or warmth noted. He did have recent ED admission on 09/11/18 with left foot pain and negative for DVT. Dx with possible gout. He does wear foot drop brace at night on his right foot. No further concerns at this time. Denies new or worsening stroke/TIA symptoms.    ROS:   14 system review of systems performed and negative with  exception of weakness, gait difficulty, foot pain  PMH:  Past Medical History:  Diagnosis Date   Adenocarcinoma of right lung ([place])  01/06/2016   Anginal chest pain at rest [facility])    Chronic non, controlled on Lorazepam   Anxiety    Arthritis    "all over"    BPH (benign prostatic hyperplasia)    CAD (coronary artery disease)    Chronic lower back pain    Complication of anesthesia    "problems making water afterwards"   Depression    DJD (degenerative joint disease)    Esophageal ulcer without bleeding    bid ppi indefinitely   Family history of adverse reaction to anesthesia    "children w/PONV"   GERD (gastroesophageal reflux disease)    Headache    "couple/week maybe" (09/04/2015)   Hemochromatosis    Possible, elevated iron stores, hook-like osteophytes on hand films, normal LFTs   Hepatitis    "yellow jaundice as a baby"   History of ASCVD    MULTIVESSEL   Hyperlipidemia    Hypertension    Osteoarthritis    Pneumonia    "several times; got a little now" (09/04/2015)   Rheumatoid arthritis ([place])    Subdural hematoma ([place])    small after fall 09/2016-plavix held, neurosurgery consulted.  Asymptomatic.     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[place]) 02/2018   on Lovenox lifelong since he failed PO Eliquis    PSH:  Past Surgical History:  Procedure Laterality Date   ARM SKIN LESION BIOPSY / EXCISION Left 09/02/2015   CATARACT EXTRACTION W/ INTRAOCULAR LENS  IMPLANT, BILATERAL Bilateral    CHOLECYSTECTOMY OPEN     CORNEAL TRANSPLANT Bilateral    "one at [facility]; one at [facility]"   [place] W/ STENT     DE stent ostium into the right radial free graft at OM-, 02-2007   ESOPHAGOGASTRODUODENOSCOPY N/A 11/09/2014   Procedure: ESOPHAGOGASTRODUODENOSCOPY (EGD);  Surgeon: [name], MD;  Location: [facility] ENDOSCOPY;  Service: Endoscopy;  Laterality: N/A;   INGUINAL HERNIA REPAIR     JOINT REPLACEMENT     LEFT HEART CATH AND CORS/GRAFTS ANGIOGRAPHY N/A 06/27/2017   Procedure: LEFT HEART CATH AND  CORS/GRAFTS ANGIOGRAPHY;  Surgeon: [name], MD;  Location: [place] CV LAB;  Service: Cardiovascular;  Laterality: N/A;   NASAL SINUS SURGERY     SHOULDER OPEN ROTATOR CUFF REPAIR Right    TOTAL KNEE ARTHROPLASTY Bilateral     Social History:  Social History   Socioeconomic History   Marital status: Widowed    Spouse name: Not on file   Number of children: Not on file   Years of education: Not on file   Highest education level: Not on file  Occupational History   Not on file  Social Needs   Financial resource strain: Not on file   Food insecurity    Worry: Not on file    Inability: Not on file   Transportation needs    Medical: Not on file    Non-medical: Not on file  Tobacco Use   Smoking status: Never Smoker   Smokeless tobacco: Former Systems developer    Types: Chew   Tobacco comment: "quit chewing in the 1960s"  Substance and Sexual Activity   Alcohol use: No   Drug use: No   Sexual activity: Never  Lifestyle   Physical activity    Days per  week: Not on file    Minutes per session: Not on file   Stress: Not on file  Relationships   Social connections    Talks on phone: Not on file    Gets together: Not on file    Attends religious service: Not on file    Active member of club or organization: Not on file    Attends meetings of clubs or organizations: Not on file    Relationship status: Not on file   Intimate partner violence    Fear of current or ex partner: Not on file    Emotionally abused: Not on file    Physically abused: Not on file    Forced sexual activity: Not on file  Other Topics Concern   Not on file  Social History Narrative   01-05-18 Unable to ask abuse questions family with him today.   11-012-19 Unable to ask abuse questions family with him today.    Family History:  Family History  Problem Relation Age of Onset   Arthritis-Osteo Sister    Heart attack Brother    Heart attack Other    Cancer Neg Hx      Medications:   Current Outpatient Medications on File Prior to Visit  Medication Sig Dispense Refill   acetaminophen (TYLENOL) 325 MG tablet Take 1-2 tablets (325-650 mg total) by mouth every 4 (four) hours as needed for mild pain.     albuterol (VENTOLIN HFA) 108 (90 Base) MCG/ACT inhaler Inhale 2 puffs into the lungs every 4 (four) hours as needed for wheezing or shortness of breath. 1 Inhaler 0   ALPRAZolam (XANAX) 0.5 MG tablet Take 0.5 tablets (0.25 mg total) by mouth 3 (three) times daily as needed for anxiety. 30 tablet 0   aspirin EC 81 MG EC tablet Take 1 tablet (81 mg total) by mouth daily.     camphor-menthol (SARNA) lotion Apply 1 application topically daily as needed for itching.     furosemide (LASIX) 20 MG tablet Take 1 tablet (20 mg total) by mouth daily. 30 tablet 0   Lifitegrast (XIIDRA) 5 % SOLN Place 1-2 drops into both eyes daily.      magnesium oxide (MAG-OX) 400 (241.3 Mg) MG tablet Take 1 tablet (400 mg total) by mouth 2 (two) times daily. 180 tablet 3   metoprolol tartrate (LOPRESSOR) 25 MG tablet Take 0.5 tablets (12.5 mg total) by mouth 2 (two) times daily. 30 tablet 0   nitroGLYCERIN (NITROSTAT) 0.4 MG SL tablet Place 1 tablet (0.4 mg total) under the tongue every 5 (five) minutes as needed for chest pain. 25 tablet 2   pantoprazole (PROTONIX) 40 MG tablet Take 1 tablet (40 mg total) by mouth every other day. 30 tablet 0   polyethylene glycol (MIRALAX / GLYCOLAX) 17 g packet Take 17 g by mouth daily as needed for mild constipation. 14 each 0   rosuvastatin (CRESTOR) 5 MG tablet Take 1 tablet (5 mg total) by mouth daily at 6 PM. 30 tablet 0   tamsulosin (FLOMAX) 0.4 MG CAPS capsule Take 1 capsule (0.4 mg total) by mouth daily. 30 capsule 0   No current facility-administered medications on file prior to visit.     Allergies:   Allergies  Allergen Reactions   Feldene [Piroxicam] Other (See Comments)    REACTION: Blisters   Statins Other (See  Comments)    Myalgias   Doxycycline Other (See Comments)    REACTION:  unknown   Latex Rash  Tequin Anxiety   Valium Other (See Comments)    REACTION:  unknown     Physical Exam  General: frail elderly Caucasian male, seated, in no evident distress Head: head normocephalic and atraumatic.   Musculoskeletal: small area of erythema on top of right foot. Subjective pain. No swelling    Neurologic Exam Mental Status: Awake and fully alert. Oriented to place and time. Recent and remote memory intact. Attention span, concentration and fund of knowledge appropriate. Mood and affect appropriate.  Cranial Nerves: Fundoscopic exam reveals sharp disc margins. Pupils equal, briskly reactive to light. Extraocular movements full without nystagmus. Visual fields full to confrontation. Hearing intact. Facial sensation intact. Face, tongue, palate moves normally and symmetrically.  Motor: right arm drift - falls prior to 10 seconds. Right leg drift prior to 5 seconds. Decreased right hand finger tapping.  Sensory.: decreased light touch sensation on right upper and lower extremity Coordination: Rapid alternating movements normal on left upper and lower extremity. Finger-to-nose and heel-to-shin performed accurately on left side. Gait and Station: ambulation not tested due to foot pain complaints Reflexes: UTA   NIHSS  4 Modified Rankin  4    SIGNIFICANT DIAGNOSTIC STUDIES Ct Head Code Stroke Wo Contrast 07/14/2018 IMPRESSION:  1.Acute intraparenchymal hemorrhage centered at the left thalamus, estimated volume 2.7 cc. Mild surrounding edema without significant regional mass effect.  2. Underlying age-related cerebral atrophy with moderate chronic small vessel ischemic disease.  CT CERVICAL SPINE IMPRESSION  1. No acute traumatic injury within the cervical spine.  2. Moderate cervical spondylolysis at C3-4 through C6-7, with resultant moderate to severe spinal stenosis at C4-5.    CT MAXILLOFACIAL  IMPRESSION  1. Soft tissue contusion with superimposed 3.4 cm hematoma at the right anterior face/neck.  2. No other acute maxillofacial injury. No fracture.  3. Chronic left maxillary sinusitis.   CTA HEAD AND NECK 07/14/2018 IMPRESSION  1. Negative CTA for large vessel occlusion. No acute vascular abnormality seen underlying the left thalamic hemorrhage. No aneurysm.  2.Atheromatous change about the carotid bifurcations with up to 50% narrowing on the right and 65% narrowing on the left.  3. Moderate diffuse carotid siphon atherosclerosis with moderate to advanced narrowing, left worse than right.  4. Fetal type origin of the PCAs with overall diminutive vertebrobasilar system.  5. Azygos ACA.  6.Mildly enlarged right paratracheal adenopathy, indeterminate, but could be reactive.   Ct Head Wo Contrast 07/15/2018 IMPRESSION: Stable hemorrhage left thalamus. No change from yesterday.  DG ABD 2 Views 07/15/2018 IMPRESSION: Nonobstructive bowel gas pattern. Nonspecific chronic reticular opacities at the lung bases.  EKG - SR rate 80 BPM. (See cardiology reading for complete details)  Bilateral Lower Extremity Venous Dopplers 07/14/2018 Summary: Right:There is no evidence of deep vein thrombosis in the lower extremity. However, portions of this examination were limited- see technologist comments above. Left:Findings consistent with chronic deep vein thrombosis involving the left peroneal vein.  ASSESSMENT: Evan [name] is a 85 y.o. 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 Vascular risk factors include HLD, HTN, s/p CABG, AF, thrombocytopenia, hx of PE and CAD. Stable from stroke standpoint with residual right hemiparesis.     PLAN:  1. L ICH : Continue aspirin 81mg  and crestor 5mg   for secondary stroke prevention. Maintain strict control of hypertension with blood  pressure goal below 130/90, diabetes with hemoglobin A1c goal below 6.5% and cholesterol with LDL cholesterol (bad cholesterol) goal below 70  mg/dL.  I also advised the patient to eat a healthy diet with plenty of whole grains, cereals, fruits and vegetables, exercise regularly with at least 30 minutes of continuous activity daily and maintain ideal body weight. 2. HTN: Advised to continue current treatment regimen.  Advised to continue to monitor at home along with continued follow-up with PCP for management 3. HLD: Advised to continue current treatment regimen along with continued follow-up with PCP for future prescribing and monitoring of lipid panel 4. Atrial fibrillation: continue aspirin and ongoing follow up with PCP 5. Right foot pain: order placed for lower extremity ultrasound due to pain with redness and hx of PE and DVT. Recommend avoiding use of foot drop brace at night for a few days due to pain.    Follow up in 3 months or call earlier if needed   Greater than 50% of time during this 45 minute  Non face to face visit was spent on counseling, explanation of diagnosis of L ICH, reviewing risk factor management of atrial fibrillation, HTN and HLD, planning of further management along with potential future management, and discussion with patient and family answering all questions.    Venancio Poisson, AGNP-BC  [facility] [address] [place],  [address]  Phone [phone] Fax [phone] Note: This document was prepared with digital dictation and possible smart phrase technology. Any transcriptional errors that result from this process are unintentional.

## 2018-09-14 DIAGNOSIS — I619 Nontraumatic intracerebral hemorrhage, unspecified: Secondary | ICD-10-CM | POA: Diagnosis not present

## 2018-09-14 DIAGNOSIS — D689 Coagulation defect, unspecified: Secondary | ICD-10-CM | POA: Diagnosis not present

## 2018-09-14 DIAGNOSIS — I13 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 failure and stage 1 through stage 4 chronic kidney disease, or unspecified chronic kidney disease: Secondary | ICD-10-CM | POA: Diagnosis not present

## 2018-09-14 DIAGNOSIS — I509 Heart failure, unspecified: Secondary | ICD-10-CM | POA: Diagnosis not present

## 2018-09-14 DIAGNOSIS — N183 Chronic kidney disease, stage 3 (moderate): Secondary | ICD-10-CM | POA: Diagnosis not present

## 2018-09-14 DIAGNOSIS — J449 Chronic obstructive pulmonary disease, unspecified: Secondary | ICD-10-CM | POA: Diagnosis not present

## 2018-09-14 DIAGNOSIS — I2510 Atherosclerotic heart disease of native coronary artery without angina pectoris: Secondary | ICD-10-CM | POA: Diagnosis not present

## 2018-09-14 DIAGNOSIS — C3431 Malignant neoplasm of lower lobe, right bronchus or lung: Secondary | ICD-10-CM | POA: Diagnosis not present

## 2018-09-14 DIAGNOSIS — I4891 Unspecified atrial fibrillation: Secondary | ICD-10-CM | POA: Diagnosis not present

## 2018-09-14 DIAGNOSIS — I610 Nontraumatic intracerebral hemorrhage in hemisphere, subcortical: Secondary | ICD-10-CM | POA: Diagnosis not present

## 2018-09-14 DIAGNOSIS — Z86718 Personal history of other venous thrombosis and embolism: Secondary | ICD-10-CM | POA: Diagnosis not present

## 2018-09-14 DIAGNOSIS — I69218 Other symptoms and signs involving cognitive functions following other nontraumatic intracranial hemorrhage: Secondary | ICD-10-CM | POA: Diagnosis not present

## 2018-09-15 DIAGNOSIS — D689 Coagulation defect, unspecified: Secondary | ICD-10-CM | POA: Diagnosis not present

## 2018-09-15 DIAGNOSIS — I610 Nontraumatic intracerebral hemorrhage in hemisphere, subcortical: Secondary | ICD-10-CM | POA: Diagnosis not present

## 2018-09-15 DIAGNOSIS — I619 Nontraumatic intracerebral hemorrhage, unspecified: Secondary | ICD-10-CM | POA: Diagnosis not present

## 2018-09-16 ENCOUNTER — Encounter: Payer: Self-pay | Admitting: Adult Health

## 2018-09-17 ENCOUNTER — Telehealth (HOSPITAL_COMMUNITY): Payer: Self-pay | Admitting: Radiology

## 2018-09-17 DIAGNOSIS — I4891 Unspecified atrial fibrillation: Secondary | ICD-10-CM | POA: Diagnosis not present

## 2018-09-17 DIAGNOSIS — N183 Chronic kidney disease, stage 3 (moderate): Secondary | ICD-10-CM | POA: Diagnosis not present

## 2018-09-17 DIAGNOSIS — C3431 Malignant neoplasm of lower lobe, right bronchus or lung: Secondary | ICD-10-CM | POA: Diagnosis not present

## 2018-09-17 DIAGNOSIS — I13 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 failure and stage 1 through stage 4 chronic kidney disease, or unspecified chronic kidney disease: Secondary | ICD-10-CM | POA: Diagnosis not present

## 2018-09-17 DIAGNOSIS — Z86718 Personal history of other venous thrombosis and embolism: Secondary | ICD-10-CM | POA: Diagnosis not present

## 2018-09-17 DIAGNOSIS — I69218 Other symptoms and signs involving cognitive functions following other nontraumatic intracranial hemorrhage: Secondary | ICD-10-CM | POA: Diagnosis not present

## 2018-09-17 DIAGNOSIS — I509 Heart failure, unspecified: Secondary | ICD-10-CM | POA: Diagnosis not present

## 2018-09-17 DIAGNOSIS — I2510 Atherosclerotic heart disease of native coronary artery without angina pectoris: Secondary | ICD-10-CM | POA: Diagnosis not present

## 2018-09-17 DIAGNOSIS — J449 Chronic obstructive pulmonary disease, unspecified: Secondary | ICD-10-CM | POA: Diagnosis not present

## 2018-09-17 NOTE — Telephone Encounter (Signed)
Spoke with patient's daughter to schedule patient. She says to call her back.

## 2018-09-17 NOTE — Progress Notes (Signed)
I agree with the above plan 

## 2018-09-18 DIAGNOSIS — I2510 Atherosclerotic heart disease of native coronary artery without angina pectoris: Secondary | ICD-10-CM | POA: Diagnosis not present

## 2018-09-18 DIAGNOSIS — I509 Heart failure, unspecified: Secondary | ICD-10-CM | POA: Diagnosis not present

## 2018-09-18 DIAGNOSIS — J449 Chronic obstructive pulmonary disease, unspecified: Secondary | ICD-10-CM | POA: Diagnosis not present

## 2018-09-18 DIAGNOSIS — I4891 Unspecified atrial fibrillation: Secondary | ICD-10-CM | POA: Diagnosis not present

## 2018-09-18 DIAGNOSIS — Z86718 Personal history of other venous thrombosis and embolism: Secondary | ICD-10-CM | POA: Diagnosis not present

## 2018-09-18 DIAGNOSIS — I13 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 failure and stage 1 through stage 4 chronic kidney disease, or unspecified chronic kidney disease: Secondary | ICD-10-CM | POA: Diagnosis not present

## 2018-09-18 DIAGNOSIS — C3431 Malignant neoplasm of lower lobe, right bronchus or lung: Secondary | ICD-10-CM | POA: Diagnosis not present

## 2018-09-18 DIAGNOSIS — I69218 Other symptoms and signs involving cognitive functions following other nontraumatic intracranial hemorrhage: Secondary | ICD-10-CM | POA: Diagnosis not present

## 2018-09-18 DIAGNOSIS — N183 Chronic kidney disease, stage 3 (moderate): Secondary | ICD-10-CM | POA: Diagnosis not present

## 2018-09-18 DIAGNOSIS — N390 Urinary tract infection, site not specified: Secondary | ICD-10-CM | POA: Diagnosis not present

## 2018-09-19 ENCOUNTER — Encounter: Payer: Self-pay | Admitting: Physical Medicine & Rehabilitation

## 2018-09-19 ENCOUNTER — Other Ambulatory Visit: Payer: Self-pay

## 2018-09-19 ENCOUNTER — Encounter: Payer: Medicare HMO | Attending: Physical Medicine & Rehabilitation | Admitting: Physical Medicine & Rehabilitation

## 2018-09-19 VITALS — BP 107/66 | HR 76 | Temp 97.0°F

## 2018-09-19 DIAGNOSIS — I2510 Atherosclerotic heart disease of native coronary artery without angina pectoris: Secondary | ICD-10-CM | POA: Insufficient documentation

## 2018-09-19 DIAGNOSIS — F329 Major depressive disorder, single episode, unspecified: Secondary | ICD-10-CM | POA: Insufficient documentation

## 2018-09-19 DIAGNOSIS — Y842 Radiological procedure and radiotherapy as the cause of abnormal reaction of the patient, or of later complication, without mention of misadventure at the time of the procedure: Secondary | ICD-10-CM | POA: Insufficient documentation

## 2018-09-19 DIAGNOSIS — Z951 Presence of aortocoronary bypass graft: Secondary | ICD-10-CM | POA: Diagnosis not present

## 2018-09-19 DIAGNOSIS — L89609 Pressure ulcer of unspecified heel, unspecified stage: Secondary | ICD-10-CM | POA: Insufficient documentation

## 2018-09-19 DIAGNOSIS — Z8261 Family history of arthritis: Secondary | ICD-10-CM | POA: Insufficient documentation

## 2018-09-19 DIAGNOSIS — R269 Unspecified abnormalities of gait and mobility: Secondary | ICD-10-CM | POA: Diagnosis not present

## 2018-09-19 DIAGNOSIS — I69391 Dysphagia following cerebral infarction: Secondary | ICD-10-CM

## 2018-09-19 DIAGNOSIS — M069 Rheumatoid arthritis, unspecified: Secondary | ICD-10-CM | POA: Insufficient documentation

## 2018-09-19 DIAGNOSIS — Z947 Corneal transplant status: Secondary | ICD-10-CM | POA: Diagnosis not present

## 2018-09-19 DIAGNOSIS — M545 Low back pain: Secondary | ICD-10-CM | POA: Diagnosis not present

## 2018-09-19 DIAGNOSIS — Z955 Presence of coronary angioplasty implant and graft: Secondary | ICD-10-CM | POA: Diagnosis not present

## 2018-09-19 DIAGNOSIS — J438 Other emphysema: Secondary | ICD-10-CM

## 2018-09-19 DIAGNOSIS — Z8249 Family history of ischemic heart disease and other diseases of the circulatory system: Secondary | ICD-10-CM | POA: Diagnosis not present

## 2018-09-19 DIAGNOSIS — R1310 Dysphagia, unspecified: Secondary | ICD-10-CM | POA: Diagnosis not present

## 2018-09-19 DIAGNOSIS — R4189 Other symptoms and signs involving cognitive functions and awareness: Secondary | ICD-10-CM | POA: Insufficient documentation

## 2018-09-19 DIAGNOSIS — I10 Essential (primary) hypertension: Secondary | ICD-10-CM | POA: Diagnosis not present

## 2018-09-19 DIAGNOSIS — R600 Localized edema: Secondary | ICD-10-CM | POA: Insufficient documentation

## 2018-09-19 DIAGNOSIS — I25709 Atherosclerosis of coronary artery bypass graft(s), unspecified, with unspecified angina pectoris: Secondary | ICD-10-CM | POA: Diagnosis not present

## 2018-09-19 DIAGNOSIS — Z9049 Acquired absence of other specified parts of digestive tract: Secondary | ICD-10-CM | POA: Diagnosis not present

## 2018-09-19 DIAGNOSIS — M79662 Pain in left lower leg: Secondary | ICD-10-CM | POA: Diagnosis not present

## 2018-09-19 DIAGNOSIS — G8929 Other chronic pain: Secondary | ICD-10-CM | POA: Insufficient documentation

## 2018-09-19 DIAGNOSIS — J700 Acute pulmonary manifestations due to radiation: Secondary | ICD-10-CM | POA: Insufficient documentation

## 2018-09-19 DIAGNOSIS — Z85118 Personal history of other malignant neoplasm of bronchus and lung: Secondary | ICD-10-CM | POA: Insufficient documentation

## 2018-09-19 DIAGNOSIS — L89629 Pressure ulcer of left heel, unspecified stage: Secondary | ICD-10-CM | POA: Diagnosis not present

## 2018-09-19 DIAGNOSIS — I610 Nontraumatic intracerebral hemorrhage in hemisphere, subcortical: Secondary | ICD-10-CM | POA: Diagnosis not present

## 2018-09-19 DIAGNOSIS — F419 Anxiety disorder, unspecified: Secondary | ICD-10-CM | POA: Diagnosis not present

## 2018-09-19 DIAGNOSIS — Z96653 Presence of artificial knee joint, bilateral: Secondary | ICD-10-CM | POA: Insufficient documentation

## 2018-09-19 DIAGNOSIS — M1990 Unspecified osteoarthritis, unspecified site: Secondary | ICD-10-CM | POA: Insufficient documentation

## 2018-09-19 MED FILL — ROSUVASTATIN CALCIUM 5 MG T: 5 | 90 days supply | Qty: 90 | Fill #0

## 2018-09-19 MED FILL — FUROSEMIDE 20 MG TABS: 20 | 30 days supply | Qty: 30 | Fill #0

## 2018-09-19 NOTE — Progress Notes (Signed)
Subjective:    Patient ID: Evan [name], male    DOB: [birth year], 85 y.o.   MRN: [id number]  HPI 85 year old male with history of CAD, RA, RLL adenocarcinoma, possible radiation pneumonitis, A fib, DVT/PE--on treatment dose Lovenox, SDH 09/2016, recent admission for RUE hematoma/cellulitis, chronic thrombocytopenia presents for hospital follow-up after receiving CIR for left thalamic hemorrhage.  Since discharge, patient went to the ED x2, the first time with shortness of breath and the second time for foot pain, notes reviewed. He also Neurology, notes reviewed.  Plans for doppler on RLE.  LLE doppler reviewed, negative.  He continues to receive therapies and follow up with Neuro.  He has not followed up regarding restarting anti-depressent.  He is following up with PCP. He uses supplemental O2 1-2/week.  Denies falls.   Pain Inventory Average Pain 7 Pain Right Now 8 My pain is aching  In the last 24 hours, has pain interfered with the following? General activity 8 Relation with others 0 Enjoyment of life 0 What TIME of day is your pain at its worst? morning Sleep (in general) Fair  Pain is worse with: walking and standing Pain improves with: therapy/exercise Relief from Meds: 0  Mobility use a wheelchair needs help with transfers  Function disabled: date disabled na I need assistance with the following:  dressing, bathing, toileting, meal prep, household duties and shopping  Neuro/Psych weakness numbness depression anxiety  Prior Studies Any changes since last visit?  no  Physicians involved in your care Any changes since last visit?  no   Family History  Problem Relation Age of Onset  . Arthritis-Osteo Sister   . Heart attack Brother   . Heart attack Other   . Cancer Neg Hx    Social History   Socioeconomic History  . Marital status: Widowed    Spouse name: Not on file  . Number of children: Not on file  . Years of education: Not on file  . Highest  education level: Not on file  Occupational History  . Not on file  Social Needs  . Financial resource strain: Not on file  . Food insecurity    Worry: Not on file    Inability: Not on file  . Transportation needs    Medical: Not on file    Non-medical: Not on file  Tobacco Use  . Smoking status: Never Smoker  . Smokeless tobacco: Former Systems developer    Types: Chew  . Tobacco comment: "quit chewing in the 1960s"  Substance and Sexual Activity  . Alcohol use: No  . Drug use: No  . Sexual activity: Never  Lifestyle  . Physical activity    Days per week: Not on file    Minutes per session: Not on file  . Stress: Not on file  Relationships  . Social Herbalist on phone: Not on file    Gets together: Not on file    Attends religious service: Not on file    Active member of club or organization: Not on file    Attends meetings of clubs or organizations: Not on file    Relationship status: Not on file  Other Topics Concern  . Not on file  Social History Narrative   01-05-18 Unable to ask abuse questions family with him today.   11-012-19 Unable to ask abuse questions family with him today.   Past Surgical History:  Procedure Laterality Date  . ARM SKIN LESION BIOPSY / EXCISION  Left 09/02/2015  . CATARACT EXTRACTION W/ INTRAOCULAR LENS  IMPLANT, BILATERAL Bilateral   . CHOLECYSTECTOMY OPEN    . CORNEAL TRANSPLANT Bilateral    "one at [facility]; one at [facility]"  . CORONARY ANGIOPLASTY WITH STENT PLACEMENT    . CORONARY ARTERY BYPASS GRAFT  1999  . DESCENDING AORTIC ANEURYSM REPAIR W/ STENT     DE stent ostium into the right radial free graft at OM-, 02-2007  . ESOPHAGOGASTRODUODENOSCOPY N/A 11/09/2014   Procedure: ESOPHAGOGASTRODUODENOSCOPY (EGD);  Surgeon: [name], MD;  Location: [facility] ENDOSCOPY;  Service: Endoscopy;  Laterality: N/A;  . INGUINAL HERNIA REPAIR    . JOINT REPLACEMENT    . LEFT HEART CATH AND CORS/GRAFTS ANGIOGRAPHY N/A 06/27/2017   Procedure: LEFT HEART CATH AND  CORS/GRAFTS ANGIOGRAPHY;  Surgeon: [name], MD;  Location: [place] CV LAB;  Service: Cardiovascular;  Laterality: N/A;  . NASAL SINUS SURGERY    . SHOULDER OPEN ROTATOR CUFF REPAIR Right   . TOTAL KNEE ARTHROPLASTY Bilateral    Past Medical History:  Diagnosis Date  . Adenocarcinoma of right lung ([place]) 01/06/2016  . Anginal chest pain at rest [facility])    Chronic non, controlled on Lorazepam  . Anxiety   . Arthritis    "all over"   . BPH (benign prostatic hyperplasia)   . CAD (coronary artery disease)   . Chronic lower back pain   . Complication of anesthesia    "problems making water afterwards"  . Depression   . DJD (degenerative joint disease)   . Esophageal ulcer without bleeding    bid ppi indefinitely  . Family history of adverse reaction to anesthesia    "children w/PONV"  . GERD (gastroesophageal reflux disease)   . Headache    "couple/week maybe" (09/04/2015)  . Hemochromatosis    Possible, elevated iron stores, hook-like osteophytes on hand films, normal LFTs  . Hepatitis    "yellow jaundice as a baby"  . History of ASCVD    MULTIVESSEL  . Hyperlipidemia   . Hypertension   . Osteoarthritis   . Pneumonia    "several times; got a little now" (09/04/2015)  . Rheumatoid arthritis ([place])   . Subdural hematoma ([facility])    small after fall 09/2016-plavix held, neurosurgery consulted.  Asymptomatic.    Marland Kitchen 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[place]) 02/2018   on Lovenox lifelong since he failed PO Eliquis   BP 107/66   Pulse 76   Temp (!) 97 F (36.1 C)   SpO2 100%   Opioid Risk Score:   Fall Risk Score:  `1  Depression screen PHQ 2/9  Depression screen [facility] 2/9 08/20/2018 10/26/2017 08/23/2017 05/04/2017 04/21/2017  Decreased Interest 0 0 0 0 0  Down, Depressed, Hopeless 2 0 0 0 0  PHQ - 2 Score 2 0 0 0 0  Altered sleeping 1 - - - -  Tired, decreased energy 2 - - - -  Change in appetite 0 - - - -  Feeling bad or failure about yourself  2 - - - -  Trouble concentrating 0 - - - -   Moving slowly or fidgety/restless 0 - - - -  Suicidal thoughts 1 - - - -  PHQ-9 Score 8 - - - -  Difficult doing work/chores Somewhat difficult - - - -  Some recent data might be hidden    Review of Systems  Constitutional: Positive for chills and fever.  HENT: Negative.   Eyes: Negative.   Respiratory: Positive for cough and shortness  of breath.   Cardiovascular: Negative.   Endocrine: Negative.   Genitourinary: Positive for difficulty urinating, dysuria and frequency.  Musculoskeletal: Positive for arthralgias, gait problem and myalgias.  Skin: Positive for rash.  Allergic/Immunologic: Negative.   Neurological: Positive for tremors, weakness and numbness.  Hematological: Negative.   Psychiatric/Behavioral: Positive for dysphoric mood. The patient is nervous/anxious.   All other systems reviewed and are negative.      Objective:   Physical Exam Constitutional: No distress . Vital signs reviewed. HENT: Normocephalic.  Atraumatic. Eyes: EOMI. No discharge. Cardiovascular: No JVD. Respiratory: Normal effort.   GI: Non-distended. Musc: No edema or tenderness in extremities, fluctuating per daughter. Neurological:  He alert and oriented x2 Extreme HOH Motor:  RUE: Shoulder abduction 4/5 proximal to distal Right lower extremity: 4+/5 proximal to distal Skin: Warm and dry intact. Psychiatric: Normal mood.  Normal affect.    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  1. 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   Cont therapies  Cont follow up with Neuro  2. Depression  Consider resuming Prozaac per PCP - elevated Qtc on higher dose, would avoid increasing above 20mg , reminded again  3. H/o Lung  cancer/radiation pneumonitis:    Continue nebulizers  Anxiety component  Cont follow up Pulm   Saturations have been within normal limits for the most part and using supplemental O2 PRN   4. Gait abnormality  Cont wheelchair for safety   5. Pressure sores: Healing  Heel sore- encouraged use of PRAFO  6. LE edema and pain  Transient per daughter  Consider Cards follow up  LLE doppler neg DVT  Consider workup for gout  Daughter does not want Gabapentin 100 qhs - "just dont" - later states pain in managable  7. Dysphagia  Improving

## 2018-09-20 DIAGNOSIS — I509 Heart failure, unspecified: Secondary | ICD-10-CM | POA: Diagnosis not present

## 2018-09-20 DIAGNOSIS — Z86718 Personal history of other venous thrombosis and embolism: Secondary | ICD-10-CM | POA: Diagnosis not present

## 2018-09-20 DIAGNOSIS — C3431 Malignant neoplasm of lower lobe, right bronchus or lung: Secondary | ICD-10-CM | POA: Diagnosis not present

## 2018-09-20 DIAGNOSIS — J449 Chronic obstructive pulmonary disease, unspecified: Secondary | ICD-10-CM | POA: Diagnosis not present

## 2018-09-20 DIAGNOSIS — I13 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 failure and stage 1 through stage 4 chronic kidney disease, or unspecified chronic kidney disease: Secondary | ICD-10-CM | POA: Diagnosis not present

## 2018-09-20 DIAGNOSIS — N183 Chronic kidney disease, stage 3 (moderate): Secondary | ICD-10-CM | POA: Diagnosis not present

## 2018-09-20 DIAGNOSIS — I69218 Other symptoms and signs involving cognitive functions following other nontraumatic intracranial hemorrhage: Secondary | ICD-10-CM | POA: Diagnosis not present

## 2018-09-20 DIAGNOSIS — I4891 Unspecified atrial fibrillation: Secondary | ICD-10-CM | POA: Diagnosis not present

## 2018-09-20 DIAGNOSIS — I2510 Atherosclerotic heart disease of native coronary artery without angina pectoris: Secondary | ICD-10-CM | POA: Diagnosis not present

## 2018-09-21 ENCOUNTER — Telehealth: Payer: Self-pay | Admitting: Family Medicine

## 2018-09-21 DIAGNOSIS — I4891 Unspecified atrial fibrillation: Secondary | ICD-10-CM | POA: Diagnosis not present

## 2018-09-21 DIAGNOSIS — N183 Chronic kidney disease, stage 3 (moderate): Secondary | ICD-10-CM | POA: Diagnosis not present

## 2018-09-21 DIAGNOSIS — I13 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 failure and stage 1 through stage 4 chronic kidney disease, or unspecified chronic kidney disease: Secondary | ICD-10-CM | POA: Diagnosis not present

## 2018-09-21 DIAGNOSIS — Z86718 Personal history of other venous thrombosis and embolism: Secondary | ICD-10-CM | POA: Diagnosis not present

## 2018-09-21 DIAGNOSIS — J449 Chronic obstructive pulmonary disease, unspecified: Secondary | ICD-10-CM | POA: Diagnosis not present

## 2018-09-21 DIAGNOSIS — C3431 Malignant neoplasm of lower lobe, right bronchus or lung: Secondary | ICD-10-CM | POA: Diagnosis not present

## 2018-09-21 DIAGNOSIS — I2510 Atherosclerotic heart disease of native coronary artery without angina pectoris: Secondary | ICD-10-CM | POA: Diagnosis not present

## 2018-09-21 DIAGNOSIS — I509 Heart failure, unspecified: Secondary | ICD-10-CM | POA: Diagnosis not present

## 2018-09-21 DIAGNOSIS — I69218 Other symptoms and signs involving cognitive functions following other nontraumatic intracranial hemorrhage: Secondary | ICD-10-CM | POA: Diagnosis not present

## 2018-09-21 MED ORDER — CIPROFLOXACIN HCL 500 MG PO TABS: 500.0000 mg | ORAL_TABLET | Freq: Two times a day (BID) | ORAL | 0 refills | Status: DC

## 2018-09-21 NOTE — Telephone Encounter (Signed)
Per [name] has uti can use cipro 500mg  bid x 7 days. BillieJo aware and med sent to [facility].

## 2018-09-21 NOTE — Telephone Encounter (Signed)
Evan [name] is wanting to know why we have not called in antibiotic for her dad yet? I do not see any messages regarding this or labs.  According to her advanced home sent over urinary results and we still have not responded?  Please call her asap because she doesn't want him to go the weekend without antibiotic.

## 2018-09-24 ENCOUNTER — Other Ambulatory Visit: Payer: Medicare HMO | Admitting: Nurse Practitioner

## 2018-09-24 ENCOUNTER — Other Ambulatory Visit: Payer: Self-pay

## 2018-09-24 DIAGNOSIS — I13 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 failure and stage 1 through stage 4 chronic kidney disease, or unspecified chronic kidney disease: Secondary | ICD-10-CM | POA: Diagnosis not present

## 2018-09-24 DIAGNOSIS — Z85118 Personal history of other malignant neoplasm of bronchus and lung: Secondary | ICD-10-CM | POA: Diagnosis not present

## 2018-09-24 DIAGNOSIS — I69218 Other symptoms and signs involving cognitive functions following other nontraumatic intracranial hemorrhage: Secondary | ICD-10-CM | POA: Diagnosis not present

## 2018-09-24 DIAGNOSIS — C3431 Malignant neoplasm of lower lobe, right bronchus or lung: Secondary | ICD-10-CM | POA: Diagnosis not present

## 2018-09-24 DIAGNOSIS — I509 Heart failure, unspecified: Secondary | ICD-10-CM | POA: Diagnosis not present

## 2018-09-24 DIAGNOSIS — J449 Chronic obstructive pulmonary disease, unspecified: Secondary | ICD-10-CM | POA: Diagnosis not present

## 2018-09-24 DIAGNOSIS — N183 Chronic kidney disease, stage 3 (moderate): Secondary | ICD-10-CM | POA: Diagnosis not present

## 2018-09-24 DIAGNOSIS — I4891 Unspecified atrial fibrillation: Secondary | ICD-10-CM | POA: Diagnosis not present

## 2018-09-24 DIAGNOSIS — Z86718 Personal history of other venous thrombosis and embolism: Secondary | ICD-10-CM | POA: Diagnosis not present

## 2018-09-24 DIAGNOSIS — I2510 Atherosclerotic heart disease of native coronary artery without angina pectoris: Secondary | ICD-10-CM | POA: Diagnosis not present

## 2018-09-25 ENCOUNTER — Other Ambulatory Visit: Payer: Self-pay

## 2018-09-25 ENCOUNTER — Telehealth: Payer: Self-pay | Admitting: Nurse Practitioner

## 2018-09-25 ENCOUNTER — Other Ambulatory Visit: Payer: Self-pay | Admitting: Family Medicine

## 2018-09-25 ENCOUNTER — Other Ambulatory Visit: Payer: Medicare HMO | Admitting: Nurse Practitioner

## 2018-09-25 DIAGNOSIS — I13 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 failure and stage 1 through stage 4 chronic kidney disease, or unspecified chronic kidney disease: Secondary | ICD-10-CM | POA: Diagnosis not present

## 2018-09-25 DIAGNOSIS — Z86718 Personal history of other venous thrombosis and embolism: Secondary | ICD-10-CM | POA: Diagnosis not present

## 2018-09-25 DIAGNOSIS — I2510 Atherosclerotic heart disease of native coronary artery without angina pectoris: Secondary | ICD-10-CM | POA: Diagnosis not present

## 2018-09-25 DIAGNOSIS — N183 Chronic kidney disease, stage 3 (moderate): Secondary | ICD-10-CM | POA: Diagnosis not present

## 2018-09-25 DIAGNOSIS — I69218 Other symptoms and signs involving cognitive functions following other nontraumatic intracranial hemorrhage: Secondary | ICD-10-CM | POA: Diagnosis not present

## 2018-09-25 DIAGNOSIS — I509 Heart failure, unspecified: Secondary | ICD-10-CM | POA: Diagnosis not present

## 2018-09-25 DIAGNOSIS — I4891 Unspecified atrial fibrillation: Secondary | ICD-10-CM | POA: Diagnosis not present

## 2018-09-25 DIAGNOSIS — J449 Chronic obstructive pulmonary disease, unspecified: Secondary | ICD-10-CM | POA: Diagnosis not present

## 2018-09-25 DIAGNOSIS — C3431 Malignant neoplasm of lower lobe, right bronchus or lung: Secondary | ICD-10-CM | POA: Diagnosis not present

## 2018-09-25 MED ORDER — SULFAMETHOXAZOLE-TRIMETHOPRIM 800-160 MG PO TABS: 1.0000 | ORAL_TABLET | Freq: Two times a day (BID) | ORAL | 0 refills | Status: DC

## 2018-09-25 NOTE — Telephone Encounter (Signed)
UC shows resistant to Cipro and Sensitive to Bactrim - bactrim sent to pharm and [name] aware. She states that she thought something was off as he is not getting much better.

## 2018-09-25 NOTE — Telephone Encounter (Signed)
I called daughter [name], Mr. Boakye daughter to complete home PC consult. [name] endorses she does not feel up to doing the appointment today but in agreement to reschedule for tomorrow at 3pm. Contact information provided.

## 2018-09-26 ENCOUNTER — Other Ambulatory Visit: Payer: Medicare HMO | Admitting: Nurse Practitioner

## 2018-09-26 ENCOUNTER — Other Ambulatory Visit: Payer: Self-pay

## 2018-09-26 DIAGNOSIS — I13 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 failure and stage 1 through stage 4 chronic kidney disease, or unspecified chronic kidney disease: Secondary | ICD-10-CM | POA: Diagnosis not present

## 2018-09-26 DIAGNOSIS — I4891 Unspecified atrial fibrillation: Secondary | ICD-10-CM | POA: Diagnosis not present

## 2018-09-26 DIAGNOSIS — I2510 Atherosclerotic heart disease of native coronary artery without angina pectoris: Secondary | ICD-10-CM | POA: Diagnosis not present

## 2018-09-26 DIAGNOSIS — Z86718 Personal history of other venous thrombosis and embolism: Secondary | ICD-10-CM | POA: Diagnosis not present

## 2018-09-26 DIAGNOSIS — I69218 Other symptoms and signs involving cognitive functions following other nontraumatic intracranial hemorrhage: Secondary | ICD-10-CM | POA: Diagnosis not present

## 2018-09-26 DIAGNOSIS — J449 Chronic obstructive pulmonary disease, unspecified: Secondary | ICD-10-CM | POA: Diagnosis not present

## 2018-09-26 DIAGNOSIS — I509 Heart failure, unspecified: Secondary | ICD-10-CM | POA: Diagnosis not present

## 2018-09-26 DIAGNOSIS — N183 Chronic kidney disease, stage 3 (moderate): Secondary | ICD-10-CM | POA: Diagnosis not present

## 2018-09-26 DIAGNOSIS — C3431 Malignant neoplasm of lower lobe, right bronchus or lung: Secondary | ICD-10-CM | POA: Diagnosis not present

## 2018-09-27 ENCOUNTER — Other Ambulatory Visit: Payer: Self-pay | Admitting: Family Medicine

## 2018-09-27 DIAGNOSIS — L280 Lichen simplex chronicus: Secondary | ICD-10-CM | POA: Diagnosis not present

## 2018-09-27 DIAGNOSIS — L308 Other specified dermatitis: Secondary | ICD-10-CM | POA: Diagnosis not present

## 2018-09-27 DIAGNOSIS — L299 Pruritus, unspecified: Secondary | ICD-10-CM | POA: Diagnosis not present

## 2018-09-27 DIAGNOSIS — R1310 Dysphagia, unspecified: Secondary | ICD-10-CM

## 2018-09-27 DIAGNOSIS — Z79899 Other long term (current) drug therapy: Secondary | ICD-10-CM | POA: Diagnosis not present

## 2018-09-27 DIAGNOSIS — R0600 Dyspnea, unspecified: Secondary | ICD-10-CM

## 2018-09-28 ENCOUNTER — Other Ambulatory Visit: Payer: Medicare HMO | Admitting: Nurse Practitioner

## 2018-09-28 ENCOUNTER — Encounter: Payer: Self-pay | Admitting: Nurse Practitioner

## 2018-09-28 ENCOUNTER — Other Ambulatory Visit: Payer: Self-pay

## 2018-09-28 DIAGNOSIS — Z515 Encounter for palliative care: Secondary | ICD-10-CM | POA: Diagnosis not present

## 2018-09-28 DIAGNOSIS — I509 Heart failure, unspecified: Secondary | ICD-10-CM | POA: Diagnosis not present

## 2018-09-28 DIAGNOSIS — R531 Weakness: Secondary | ICD-10-CM | POA: Diagnosis not present

## 2018-09-28 DIAGNOSIS — I4891 Unspecified atrial fibrillation: Secondary | ICD-10-CM | POA: Diagnosis not present

## 2018-09-28 DIAGNOSIS — I13 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 failure and stage 1 through stage 4 chronic kidney disease, or unspecified chronic kidney disease: Secondary | ICD-10-CM | POA: Diagnosis not present

## 2018-09-28 DIAGNOSIS — I2510 Atherosclerotic heart disease of native coronary artery without angina pectoris: Secondary | ICD-10-CM | POA: Diagnosis not present

## 2018-09-28 DIAGNOSIS — I69218 Other symptoms and signs involving cognitive functions following other nontraumatic intracranial hemorrhage: Secondary | ICD-10-CM | POA: Diagnosis not present

## 2018-09-28 DIAGNOSIS — N183 Chronic kidney disease, stage 3 (moderate): Secondary | ICD-10-CM | POA: Diagnosis not present

## 2018-09-28 DIAGNOSIS — Z86718 Personal history of other venous thrombosis and embolism: Secondary | ICD-10-CM | POA: Diagnosis not present

## 2018-09-28 DIAGNOSIS — C3431 Malignant neoplasm of lower lobe, right bronchus or lung: Secondary | ICD-10-CM | POA: Diagnosis not present

## 2018-09-28 DIAGNOSIS — J449 Chronic obstructive pulmonary disease, unspecified: Secondary | ICD-10-CM | POA: Diagnosis not present

## 2018-09-28 NOTE — Progress Notes (Signed)
[place] Consult Note Telephone: [phone]  Fax: [phone]  PATIENT NAME: Evan [name] DOB: July 04, 1927 MRN: [id number]  PRIMARY CARE PROVIDER:   [name], MD  REFERRING PROVIDER:  [name], MD [address] [place],  [place] [address]  RESPONSIBLE PARTY: Self; Evan [name] [phone] or [phone]  Due to the COVID-19 crisis, this visit was done via telemedicine from my office and it was initiated and consent by this patient and or family.  I was asked by Dr Dennard Schaumann to see for [facility] consult for [place] and PLAN:  1.Palliative care encounter Z51.5; Palliative medicine team will continue to support patient, patient's family, and medical team. Visit consisted of counseling and education dealing with the complex and emotionally intense issues of symptom management and palliative care in the setting of serious and potentially life-threatening illness  2. Generalized weakness R53.1  secondary to right lung 01/2016, history of radiation pneumonitis, pleurisy, COPD, coronary artery disease s/p cabg, diastolic congestive heart failure, atrial fibrillation,  myocardial  infarction,  pulmonary embolus,  history of acute pericarditis, angina chest pain at rest chronic, artery disease, ascvd, descending aortic aneurysm repair with stent, peripheral vascular disease, pulmonary embolus, subdural hematoma  continue with therapy as able. Encourage energy conservation and rest times.  ASSESSMENT:     I called Evan [name], Evan [name] daughter for palliative care initial visit. We talked about purpose or palliative care visit and Evan [name] in agreement. We talked about the last time Evan. [name] was independent at home which was this past April, 2020. We talked about past medical history in the setting of chronic disease and progression. We talked about hospitalizations and short-term rehab. We talked about catastrophic events and  overall decline. We talked about his functional level at hospitalization, short-term rehab and now he is discharged home. He is working with therapy PT/OT, speech at home in addition to [employer]. Evan [name] endorses he continues to work hard and is slowly functionally improving. We talked about his cognitive state of which he is oriented. He shares about his experiences going through this hospitalization and catastrophic event. We talked about symptoms, appetite which is good. We talked about his functional level as he is able to feed himself. He does require assistance for dressing, transferring, toileting. We talked about medical goals of care to include aggressive versus conservative vs comfort care. We talked about most form and her choice book. Evan [name] in agreement to have blank most and her choice book mailed to for her review. We talked about Life review. We talked about his work prior to retirement. He was very active up until recently. We talked about caregivers at home. Evan [name] endorses that she does have a lot of help. She lives next door and her sister lives with Evan [name]. We talked about role of palliative care and plan of care. We talked about follow-up palliative care visit in 4 weeks if needed or sooner should he declined. Palliative care visit scheduled and discuss with Evan [name] that if wishes are to complete most form will do in person as Evan [name] does not have availability of a smartphone. If Evan [name] at that time is not ready will do by telemedicine. Evan [name] share that she would update prior to visit.  I spent 60 minutes providing this consultation,  From 3:00pm to 4:00pm. More than 50% of the time in this consultation was spent coordinating communication.   HISTORY OF PRESENT ILLNESS:  Evan [name] is a 85 y.o. 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 He was hospitalized 2 / 57 / 2020 to 3 / 2 / 2020 for pustular dermatitis  and placed on Bactrim by dermatologist for suspected MRSA skin infection which rest worsened with intense pruritus, developed right arm hematoma. wound infection. Workup diagnosis cellulitis of right arm  with rash, right arm hematoma and aspirated by Orthopedic seen by infectious disease. Hospitalized 4 / 11 / 2020 to 5/ 13 / 2020 for acute spontaneous intraparenchymal hemorrhage associated with coagulopathy left thalamic. Doppler's of lower extremity revealed chronic left peroneal vein DVT therefore no need for IVC filter. CTA head neck showed atheromatous change about carotid bifurcation with his 2% narrowing on right and 60% narrowing on left as well as mildly enlarged right paratracheal adenopathy. Facial CT should soft tissue contusion 3.4 CM hematoma right anterior face and neck. He did receive impatient therapies consisting of PT / OT / speech. Find slant is time he exhibited moderate to severe cognitive impairment with difficulty following multi-step commands partly due to severe hearing loss, my decrease attention with poor  recall. He did have some improvement and activity tolerance, balance with postural control as well as compensated for deficits. At this time he required moderate to Max assistance for ADL tasks minimal assistance for supine to sitting and moderate assistance for stand pivot transfers. Gate remains non-functional at this time. He requires total assist for car transfers. He did remain on dysphasia to textures with nectar thick liquids and minimal aspiration signs. Hospitalized 5 / 19 / 2020 to 5 / 22 / 2020 after oxygen saturation drop to 80%. Workup significant for UTI with chest x-ray showing Trace bilateral pleural effusions improved aeration on the left lung base with persistent opacity at the right lung base. He was started on antibiotic therapy in urine culture grew E.coli with mental status improving. Family refused snuff placement and was continued to wish for full code. He was discharged home on antibiotic therapy. Hospitalized 08/26/2018 to 5 / 25 / 2020 for COPD with history of lung cancer. Increase in right infrahilar nodality Georgette Dover which can indicate and local recurrence of cancer. He was seen by palliative care during hospitalization 5 / 25 / 2020 for goals of care noted he is a DNR, do not intubate. Discussion consisted with daughter concerning recommendation for Hospice Services. Daughter at this time declined and wish to continue palliative care at home. She does not want him to suffer and we'll bring him to the hospital when he can't breathe. She shared that her mother died with hospice and she is aware of support provided but she is not ready for that with her dad. Per documentation daughter shared that on four different occasions over the past year she was told that  her father was going to pass and that he has not which has given them hope that he will continue to improve. Palliative Care was asked to help address goals of care.   CODE STATUS: DNR  PPS: 30% HOSPICE ELIGIBILITY/DIAGNOSIS:  TBD  PAST MEDICAL HISTORY:  Past Medical History:  Diagnosis Date   Adenocarcinoma of right lung ([place]) 01/06/2016   Anginal chest pain at rest [facility])    Chronic non, controlled on Lorazepam   Anxiety    Arthritis    "all over"    BPH (benign prostatic hyperplasia)    CAD (coronary artery disease)    Chronic lower back pain    Complication of anesthesia    "problems making water afterwards"   Depression    DJD (degenerative joint disease)    Esophageal ulcer without bleeding    bid ppi indefinitely   Family history of adverse reaction to anesthesia    "children w/PONV"   GERD (gastroesophageal reflux disease)    Headache    "couple/week maybe" (09/04/2015)   Hemochromatosis    Possible, elevated iron stores, hook-like osteophytes on hand films, normal LFTs   Hepatitis    "yellow jaundice as a baby"   History of ASCVD    MULTIVESSEL   Hyperlipidemia    Hypertension    Osteoarthritis    Pneumonia    "several times; got a little now" (09/04/2015)   Rheumatoid arthritis ([place])    Subdural hematoma ([place])    small after fall 09/2016-plavix held, neurosurgery consulted.  Asymptomatic.     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[place]) 02/2018   on Lovenox lifelong since he failed PO Eliquis    SOCIAL HX:  Social History   Tobacco Use   Smoking status: Never Smoker   Smokeless tobacco: Former Systems developer    Types: Chew   Tobacco comment: "quit chewing in the 1960s"  Substance Use Topics   Alcohol use: No    ALLERGIES:  Allergies  Allergen Reactions   Feldene [Piroxicam] Other (See Comments)    REACTION: Blisters   Statins Other (See Comments)    Myalgias   Doxycycline Other (See Comments)    REACTION:  unknown   Latex Rash   Tequin Anxiety   Valium Other (See Comments)    REACTION:  unknown     PERTINENT MEDICATIONS:  Outpatient Encounter Medications as of 09/28/2018  Medication Sig   acetaminophen (TYLENOL) 325 MG tablet Take 1-2 tablets (325-650 mg total) by  mouth every 4 (four) hours as needed for mild pain.   albuterol (VENTOLIN HFA) 108 (90 Base) MCG/ACT inhaler Inhale 2 puffs into the lungs every 4 (four) hours as needed for wheezing or shortness of breath.   ALPRAZolam (XANAX) 0.5 MG tablet Take 0.5 tablets (0.25 mg total) by mouth 3 (three) times daily as needed for anxiety.   aspirin EC 81 MG EC tablet Take 1 tablet (81 mg total) by mouth daily.   camphor-menthol (SARNA) lotion Apply 1 application topically daily as needed for itching.   ciprofloxacin (CIPRO) 500 MG tablet Take 1 tablet (500 mg total) by mouth 2 (two) times daily.   furosemide (LASIX) 20 MG tablet Take 1 tablet (20 mg total) by mouth daily.   Lifitegrast (XIIDRA) 5 % SOLN Place 1-2 drops into both eyes daily.    magnesium oxide (MAG-OX) 400 (241.3 Mg) MG tablet Take 1 tablet (400 mg total) by mouth 2 (two) times daily.   metoprolol tartrate (LOPRESSOR) 25 MG tablet Take 0.5 tablets (12.5 mg total) by  mouth 2 (two) times daily.   nitroGLYCERIN (NITROSTAT) 0.4 MG SL tablet Place 1 tablet (0.4 mg total) under the tongue every 5 (five) minutes as needed for chest pain.   pantoprazole (PROTONIX) 40 MG tablet Take 1 tablet (40 mg total) by mouth every other day.   polyethylene glycol (MIRALAX / GLYCOLAX) 17 g packet Take 17 g by mouth daily as needed for mild constipation.   rosuvastatin (CRESTOR) 5 MG tablet Take 1 tablet (5 mg total) by mouth daily at 6 PM.   sulfamethoxazole-trimethoprim (BACTRIM DS) 800-160 MG tablet Take 1 tablet by mouth 2 (two) times daily.   tamsulosin (FLOMAX) 0.4 MG CAPS capsule Take 1 capsule (0.4 mg total) by mouth daily.   No facility-administered encounter medications on file as of 09/28/2018.     PHYSICAL EXAM:  Deferred  Guida Asman Z Raylie Maddison, NP

## 2018-10-01 DIAGNOSIS — I509 Heart failure, unspecified: Secondary | ICD-10-CM | POA: Diagnosis not present

## 2018-10-01 DIAGNOSIS — I2510 Atherosclerotic heart disease of native coronary artery without angina pectoris: Secondary | ICD-10-CM | POA: Diagnosis not present

## 2018-10-01 DIAGNOSIS — C3431 Malignant neoplasm of lower lobe, right bronchus or lung: Secondary | ICD-10-CM | POA: Diagnosis not present

## 2018-10-01 DIAGNOSIS — I69218 Other symptoms and signs involving cognitive functions following other nontraumatic intracranial hemorrhage: Secondary | ICD-10-CM | POA: Diagnosis not present

## 2018-10-01 DIAGNOSIS — N183 Chronic kidney disease, stage 3 (moderate): Secondary | ICD-10-CM | POA: Diagnosis not present

## 2018-10-01 DIAGNOSIS — Z86718 Personal history of other venous thrombosis and embolism: Secondary | ICD-10-CM | POA: Diagnosis not present

## 2018-10-01 DIAGNOSIS — I4891 Unspecified atrial fibrillation: Secondary | ICD-10-CM | POA: Diagnosis not present

## 2018-10-01 DIAGNOSIS — J449 Chronic obstructive pulmonary disease, unspecified: Secondary | ICD-10-CM | POA: Diagnosis not present

## 2018-10-01 DIAGNOSIS — I13 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 failure and stage 1 through stage 4 chronic kidney disease, or unspecified chronic kidney disease: Secondary | ICD-10-CM | POA: Diagnosis not present

## 2018-10-02 DIAGNOSIS — I2510 Atherosclerotic heart disease of native coronary artery without angina pectoris: Secondary | ICD-10-CM | POA: Diagnosis not present

## 2018-10-02 DIAGNOSIS — I509 Heart failure, unspecified: Secondary | ICD-10-CM | POA: Diagnosis not present

## 2018-10-02 DIAGNOSIS — N183 Chronic kidney disease, stage 3 (moderate): Secondary | ICD-10-CM | POA: Diagnosis not present

## 2018-10-02 DIAGNOSIS — I13 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 failure and stage 1 through stage 4 chronic kidney disease, or unspecified chronic kidney disease: Secondary | ICD-10-CM | POA: Diagnosis not present

## 2018-10-02 DIAGNOSIS — Z86718 Personal history of other venous thrombosis and embolism: Secondary | ICD-10-CM | POA: Diagnosis not present

## 2018-10-02 DIAGNOSIS — C3431 Malignant neoplasm of lower lobe, right bronchus or lung: Secondary | ICD-10-CM | POA: Diagnosis not present

## 2018-10-02 DIAGNOSIS — I4891 Unspecified atrial fibrillation: Secondary | ICD-10-CM | POA: Diagnosis not present

## 2018-10-02 DIAGNOSIS — I69218 Other symptoms and signs involving cognitive functions following other nontraumatic intracranial hemorrhage: Secondary | ICD-10-CM | POA: Diagnosis not present

## 2018-10-02 DIAGNOSIS — J449 Chronic obstructive pulmonary disease, unspecified: Secondary | ICD-10-CM | POA: Diagnosis not present

## 2018-10-03 DIAGNOSIS — I509 Heart failure, unspecified: Secondary | ICD-10-CM | POA: Diagnosis not present

## 2018-10-03 DIAGNOSIS — I13 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 failure and stage 1 through stage 4 chronic kidney disease, or unspecified chronic kidney disease: Secondary | ICD-10-CM | POA: Diagnosis not present

## 2018-10-03 DIAGNOSIS — J449 Chronic obstructive pulmonary disease, unspecified: Secondary | ICD-10-CM | POA: Diagnosis not present

## 2018-10-03 DIAGNOSIS — C3431 Malignant neoplasm of lower lobe, right bronchus or lung: Secondary | ICD-10-CM | POA: Diagnosis not present

## 2018-10-03 DIAGNOSIS — I2510 Atherosclerotic heart disease of native coronary artery without angina pectoris: Secondary | ICD-10-CM | POA: Diagnosis not present

## 2018-10-03 DIAGNOSIS — N183 Chronic kidney disease, stage 3 (moderate): Secondary | ICD-10-CM | POA: Diagnosis not present

## 2018-10-03 DIAGNOSIS — I4891 Unspecified atrial fibrillation: Secondary | ICD-10-CM | POA: Diagnosis not present

## 2018-10-03 DIAGNOSIS — I69218 Other symptoms and signs involving cognitive functions following other nontraumatic intracranial hemorrhage: Secondary | ICD-10-CM | POA: Diagnosis not present

## 2018-10-03 DIAGNOSIS — Z86718 Personal history of other venous thrombosis and embolism: Secondary | ICD-10-CM | POA: Diagnosis not present

## 2018-10-04 ENCOUNTER — Other Ambulatory Visit: Payer: Self-pay | Admitting: Family Medicine

## 2018-10-04 DIAGNOSIS — Z86718 Personal history of other venous thrombosis and embolism: Secondary | ICD-10-CM | POA: Diagnosis not present

## 2018-10-04 DIAGNOSIS — I509 Heart failure, unspecified: Secondary | ICD-10-CM | POA: Diagnosis not present

## 2018-10-04 DIAGNOSIS — I2510 Atherosclerotic heart disease of native coronary artery without angina pectoris: Secondary | ICD-10-CM | POA: Diagnosis not present

## 2018-10-04 DIAGNOSIS — I69218 Other symptoms and signs involving cognitive functions following other nontraumatic intracranial hemorrhage: Secondary | ICD-10-CM | POA: Diagnosis not present

## 2018-10-04 DIAGNOSIS — I4891 Unspecified atrial fibrillation: Secondary | ICD-10-CM | POA: Diagnosis not present

## 2018-10-04 DIAGNOSIS — C3431 Malignant neoplasm of lower lobe, right bronchus or lung: Secondary | ICD-10-CM | POA: Diagnosis not present

## 2018-10-04 DIAGNOSIS — I13 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 failure and stage 1 through stage 4 chronic kidney disease, or unspecified chronic kidney disease: Secondary | ICD-10-CM | POA: Diagnosis not present

## 2018-10-04 DIAGNOSIS — N183 Chronic kidney disease, stage 3 (moderate): Secondary | ICD-10-CM | POA: Diagnosis not present

## 2018-10-04 DIAGNOSIS — J449 Chronic obstructive pulmonary disease, unspecified: Secondary | ICD-10-CM | POA: Diagnosis not present

## 2018-10-04 MED ORDER — METOPROLOL TARTRATE 25 MG PO TABS: 12.5000 mg | ORAL_TABLET | Freq: Two times a day (BID) | ORAL | 3 refills | Status: DC

## 2018-10-08 ENCOUNTER — Other Ambulatory Visit: Payer: Self-pay | Admitting: Family Medicine

## 2018-10-08 DIAGNOSIS — R1310 Dysphagia, unspecified: Secondary | ICD-10-CM

## 2018-10-09 ENCOUNTER — Encounter (HOSPITAL_COMMUNITY): Payer: Medicare HMO

## 2018-10-09 ENCOUNTER — Other Ambulatory Visit: Payer: Self-pay

## 2018-10-09 ENCOUNTER — Ambulatory Visit (HOSPITAL_COMMUNITY)
Admission: RE | Admit: 2018-10-09 | Discharge: 2018-10-09 | Disposition: A | Discharge status: Home / Self Care | Payer: Medicare HMO | Source: Ambulatory Visit | Attending: Family Medicine | Admitting: Family Medicine

## 2018-10-09 DIAGNOSIS — R0600 Dyspnea, unspecified: Secondary | ICD-10-CM | POA: Insufficient documentation

## 2018-10-09 DIAGNOSIS — R1310 Dysphagia, unspecified: Secondary | ICD-10-CM

## 2018-10-09 DIAGNOSIS — T17320A Food in larynx causing asphyxiation, initial encounter: Secondary | ICD-10-CM | POA: Diagnosis not present

## 2018-10-09 NOTE — Progress Notes (Addendum)
Modified Barium Swallow Progress Note  Patient Details  Name: Evan [name] MRN: [id number] Date of Birth: Feb 23, 1928  Today's Date: 10/09/2018  Modified Barium Swallow completed.  Full report located under Chart Review in the Imaging Section.  Brief recommendations include the following:  Clinical Impression  Pt demonstrates adequate oral function with no difficulty masticating regular textures. Oropharyngeal swallow within functional limits for age with adequate strength but occasional instances of premature spill/delayed swallow initiation. There was once instance of sensed aspiration and one instance of silent trace aspiration (sensation appears volume dependent). A simple cue to slow down and take one sip at a time resolved timing problem and further cup and straw sips were tolerated well. A chin tuck with consecutive straw sips was also very successful. Reviewed result with daughter and pt and provided written instruction. Pt may upgrade to foods of choice and thin liquids with aforementioned strategies. Advised daughter than with any decline, such as a UTI, pt may have a decline in swallow function and may need to resume thickened liquids as a precaution.    Swallow Evaluation Recommendations       SLP Diet Recommendations: Regular solids;Thin liquid   Liquid Administration via: Cup;Straw   Medication Administration: Whole meds with puree   Supervision: Patient able to self feed   Compensations: Chin tuck;Slow rate;Small sips/bites   Postural Changes: Seated upright at 90 degrees   Oral Care Recommendations: Patient independent with oral care       Evan [place], MA [place] Pager [phone] Office [phone]  Evan [name] 10/09/2018,1:00 PM

## 2018-10-10 DIAGNOSIS — C3431 Malignant neoplasm of lower lobe, right bronchus or lung: Secondary | ICD-10-CM | POA: Diagnosis not present

## 2018-10-10 DIAGNOSIS — J449 Chronic obstructive pulmonary disease, unspecified: Secondary | ICD-10-CM | POA: Diagnosis not present

## 2018-10-10 DIAGNOSIS — Z86718 Personal history of other venous thrombosis and embolism: Secondary | ICD-10-CM | POA: Diagnosis not present

## 2018-10-10 DIAGNOSIS — I509 Heart failure, unspecified: Secondary | ICD-10-CM | POA: Diagnosis not present

## 2018-10-10 DIAGNOSIS — I2510 Atherosclerotic heart disease of native coronary artery without angina pectoris: Secondary | ICD-10-CM | POA: Diagnosis not present

## 2018-10-10 DIAGNOSIS — N183 Chronic kidney disease, stage 3 (moderate): Secondary | ICD-10-CM | POA: Diagnosis not present

## 2018-10-10 DIAGNOSIS — I69218 Other symptoms and signs involving cognitive functions following other nontraumatic intracranial hemorrhage: Secondary | ICD-10-CM | POA: Diagnosis not present

## 2018-10-10 DIAGNOSIS — I13 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 failure and stage 1 through stage 4 chronic kidney disease, or unspecified chronic kidney disease: Secondary | ICD-10-CM | POA: Diagnosis not present

## 2018-10-10 DIAGNOSIS — I4891 Unspecified atrial fibrillation: Secondary | ICD-10-CM | POA: Diagnosis not present

## 2018-10-11 DIAGNOSIS — I509 Heart failure, unspecified: Secondary | ICD-10-CM | POA: Diagnosis not present

## 2018-10-11 DIAGNOSIS — I4891 Unspecified atrial fibrillation: Secondary | ICD-10-CM | POA: Diagnosis not present

## 2018-10-11 DIAGNOSIS — I13 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 failure and stage 1 through stage 4 chronic kidney disease, or unspecified chronic kidney disease: Secondary | ICD-10-CM | POA: Diagnosis not present

## 2018-10-11 DIAGNOSIS — Z86718 Personal history of other venous thrombosis and embolism: Secondary | ICD-10-CM | POA: Diagnosis not present

## 2018-10-11 DIAGNOSIS — C3431 Malignant neoplasm of lower lobe, right bronchus or lung: Secondary | ICD-10-CM | POA: Diagnosis not present

## 2018-10-11 DIAGNOSIS — I69218 Other symptoms and signs involving cognitive functions following other nontraumatic intracranial hemorrhage: Secondary | ICD-10-CM | POA: Diagnosis not present

## 2018-10-11 DIAGNOSIS — I2510 Atherosclerotic heart disease of native coronary artery without angina pectoris: Secondary | ICD-10-CM | POA: Diagnosis not present

## 2018-10-11 DIAGNOSIS — N183 Chronic kidney disease, stage 3 (moderate): Secondary | ICD-10-CM | POA: Diagnosis not present

## 2018-10-11 DIAGNOSIS — J449 Chronic obstructive pulmonary disease, unspecified: Secondary | ICD-10-CM | POA: Diagnosis not present

## 2018-10-14 DIAGNOSIS — I610 Nontraumatic intracerebral hemorrhage in hemisphere, subcortical: Secondary | ICD-10-CM | POA: Diagnosis not present

## 2018-10-14 DIAGNOSIS — I619 Nontraumatic intracerebral hemorrhage, unspecified: Secondary | ICD-10-CM | POA: Diagnosis not present

## 2018-10-14 DIAGNOSIS — D689 Coagulation defect, unspecified: Secondary | ICD-10-CM | POA: Diagnosis not present

## 2018-10-15 ENCOUNTER — Ambulatory Visit (INDEPENDENT_AMBULATORY_CARE_PROVIDER_SITE_OTHER): Payer: Medicare HMO | Admitting: Family Medicine

## 2018-10-15 ENCOUNTER — Other Ambulatory Visit: Payer: Self-pay

## 2018-10-15 VITALS — BP 110/60 | HR 68 | Temp 97.8°F | Resp 16 | Ht 68.0 in

## 2018-10-15 DIAGNOSIS — I872 Venous insufficiency (chronic) (peripheral): Secondary | ICD-10-CM

## 2018-10-15 DIAGNOSIS — I619 Nontraumatic intracerebral hemorrhage, unspecified: Secondary | ICD-10-CM | POA: Diagnosis not present

## 2018-10-15 DIAGNOSIS — M109 Gout, unspecified: Secondary | ICD-10-CM | POA: Diagnosis not present

## 2018-10-15 DIAGNOSIS — L608 Other nail disorders: Secondary | ICD-10-CM

## 2018-10-15 DIAGNOSIS — D689 Coagulation defect, unspecified: Secondary | ICD-10-CM | POA: Diagnosis not present

## 2018-10-15 DIAGNOSIS — R350 Frequency of micturition: Secondary | ICD-10-CM

## 2018-10-15 DIAGNOSIS — I610 Nontraumatic intracerebral hemorrhage in hemisphere, subcortical: Secondary | ICD-10-CM | POA: Diagnosis not present

## 2018-10-15 LAB — URINALYSIS, ROUTINE W REFLEX MICROSCOPIC
Bilirubin Urine: NEGATIVE
Glucose, UA: NEGATIVE
Hgb urine dipstick: NEGATIVE
Ketones, ur: NEGATIVE
Leukocytes,Ua: NEGATIVE
Nitrite: NEGATIVE
Protein, ur: NEGATIVE
Specific Gravity, Urine: 1.015 (ref 1.001–1.03)
pH: 7 (ref 5.0–8.0)

## 2018-10-15 MED ORDER — SOLIFENACIN SUCCINATE 10 MG PO TABS: 10.0000 mg | ORAL_TABLET | Freq: Every day | ORAL | 1 refills | Status: DC

## 2018-10-15 MED ORDER — PREDNISONE 20 MG PO TABS: ORAL_TABLET | ORAL | 0 refills | Status: DC

## 2018-10-15 NOTE — Progress Notes (Signed)
Subjective:    Patient ID: Evan [name], male    DOB: May 18, 1927, 85 y.o.   MRN: [id number]  HPI Since I last saw the patient, several things have happened.  Patient was on lifelong Lovenox after suffering a pulmonary embolism presumed due to hypercoagulable state brought on by his lung cancer.  The pulmonary embolism occurred while the patient was taking Eliquis prompting the lifelong administration of Lovenox.  However, patient suffered an intracranial hemorrhage as well as a fall.  It is uncertain as to whether the fall led to the hemorrhage or whether the hemorrhage led to the fall.  However due to this, the patient was hospitalized for more than a month in inpatient rehab.  Obviously his Lovenox was discontinued and at the current time he is only taking an aspirin.  Patient has atrial fibrillation as well as a history of a pulmonary embolism but at the present time he has not been cleared to resume any anticoagulant beyond the aspirin.  During that time, the patient lost the use of the right side of his body.  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  Due to the COVID-19 pandemic, his family was unable to visit him in the hospital.  All this is extremely disorienting to an 85 year old individual.  Prior to being hospitalized, the patient was taking Xanax 2-3 times a day.  He had been taking this for more than 15 years.  He came to me on this dosage and I have been his doctor for 12 years.  Patient had longstanding dependency to Xanax and benzodiazepine due to anxiety.  This was obviously discontinued after his fall and intracranial hemorrhage primarily for neuro checks but also to reduce the risk of confusion as the patient was also suffering metabolic encephalopathy.  He has not resumed this medication since he went home.  Since going home from the hospital however EMS has been called at least 3 times due to sudden attacks of dyspnea.  He has been back to the hospital at least  twice once with metabolic encephalopathy due to urinary tract infection as well as subjective dyspnea.  The second time was for dyspnea for unknown causes.  He was treated empirically as a COPD exacerbation but based on the discharge summary the findings were not impressive for COPD as a potential cause.  Currently he is using albuterol in the morning regardless of symptoms and then nothing later in the day.  He continues to have attacks of dyspnea having only been home from the hospital for 3 to 4 days.  Both I and his daughter believe a lot of this is anxiety and panic due to the situation that has occurred and the abrupt discontinuation of his longstanding benzodiazepine.  At that time, my plan was: I spent over 30 minutes today with the patient and his daughter reviewing his recent discharge summaries as well as the events that have occurred and transpired over the last 2 months.  This is a difficult situation.  Patient has numerous risk factors for dyspnea including COPD, atrial fibrillation, history of coronary artery disease, history of a pulmonary embolism, lung cancer with persistent right hilar lymphadenopathy.  However I truly believe the majority of his dyspnea is likely anxiety related for the factors I have outlined in the history of present illness.  I had a long discussion today with his daughter about the risk of resuming Xanax.  The patient is somewhat confused.  He is  also in a wheelchair due to weakness and balance issues after his intracranial hemorrhage.  I explained that benzodiazepines can exacerbate confusion.  They can exacerbate falls and balance issues.  However the patient has been rushed to the hospital several times in the last month due to dyspnea with no source identified.  EMS is also been called to the home several times for dyspnea even when the patient is in no apparent respiratory distress.  I believe these are likely panic attacks.  It is apparent to me that the patient is  approaching the end of his life.  He has wasted dramatically since his last visit here due to everything that is occurred.  As I explained to his daughter, I would like to focus on his comfort and quality of life.  Despite the risk of resuming benzodiazepines, I do believe this would likely improve his quality of life and lessen his subjective dyspnea.  Therefore I gave the patient a prescription for Xanax 0.5 mg.  Have instructed his daughter to give him 1/2 tablet every 8 hours as needed for panic or dyspnea.  We will also try the patient on Anoro 1 inhalation a day in case COPD is playing a role in his shortness of breath.  I also recommended they reduce his metoprolol to 1/2 tablet daily due to bradycardia on his exam today and low blood pressure and in 1 week continue the medication altogether as long as his heart rate remains low and his blood pressure remains low.  10/15/18 Since being home from the hospital, the patient's daughter states that every night, he has to go to the bathroom almost every hour.  He feels a sudden urge to urinate.  However after going to the bathroom, his symptoms returned shortly thereafter.  He reports a normal urinary stream.  At times this week however the majority the time he is able to make urine.  He is already on Flomax 0.4 mg p.o. nightly.  However this started abruptly after discharge from the hospital.  Urinalysis today shows no leukocyte esterase.  It shows no nitrites or signs of urinary tract infection.  Differential diagnosis would be BPH with lower urinary tract symptoms versus overactive bladder/bladder spasms.  He does not have any sudden urge to urinate during the daytime.  Is primarily at night once he is supine in bed.  He also has trace pitting edema in his left leg greater than his right leg.  This was ruled out to be a DVT in the hospital however he has numerous varicose veins in his leg and I believe he has some pitting edema secondary to venous  insufficiency.  However in his left foot he has significant pain to minimal palpation over the midfoot and significant pain over the first MTP joint.  It hurts simply to touch it.  There is some mild swelling around the MTP joint.  He has not had any x-rays.  He also has several thick yellow toenails that need to be trimmed.  His daughter asked me to trim all of his toenails today.  Using a pair of rongeurs, I trimmed all of his toenails. Past Medical History:  Diagnosis Date  . Adenocarcinoma of right lung ([place]) 01/06/2016  . Anginal chest pain at rest [facility])    Chronic non, controlled on Lorazepam  . Anxiety   . Arthritis    "all over"   . BPH (benign prostatic hyperplasia)   . CAD (coronary artery disease)   . Chronic  lower back pain   . Complication of anesthesia    "problems making water afterwards"  . Depression   . DJD (degenerative joint disease)   . Esophageal ulcer without bleeding    bid ppi indefinitely  . Family history of adverse reaction to anesthesia    "children w/PONV"  . GERD (gastroesophageal reflux disease)   . Headache    "couple/week maybe" (09/04/2015)  . Hemochromatosis    Possible, elevated iron stores, hook-like osteophytes on hand films, normal LFTs  . Hepatitis    "yellow jaundice as a baby"  . History of ASCVD    MULTIVESSEL  . Hyperlipidemia   . Hypertension   . Osteoarthritis   . Pneumonia    "several times; got a little now" (09/04/2015)  . Rheumatoid arthritis ([place])   . Subdural hematoma ([facility])    small after fall 09/2016-plavix held, neurosurgery consulted.  Asymptomatic.    Marland Kitchen 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[place]) 02/2018   on Lovenox lifelong since he failed PO Eliquis   Past Surgical History:  Procedure Laterality Date  . ARM SKIN LESION BIOPSY / EXCISION Left 09/02/2015  . CATARACT EXTRACTION W/ INTRAOCULAR LENS  IMPLANT, BILATERAL Bilateral   . CHOLECYSTECTOMY OPEN    . CORNEAL TRANSPLANT Bilateral    "one at [facility]; one at [facility]"  . CORONARY ANGIOPLASTY  WITH STENT PLACEMENT    . CORONARY ARTERY BYPASS GRAFT  1999  . DESCENDING AORTIC ANEURYSM REPAIR W/ STENT     DE stent ostium into the right radial free graft at OM-, 02-2007  . ESOPHAGOGASTRODUODENOSCOPY N/A 11/09/2014   Procedure: ESOPHAGOGASTRODUODENOSCOPY (EGD);  Surgeon: [name], MD;  [facility] ENDOSCOPY;  Service: Endoscopy;  Laterality: N/A;  . INGUINAL HERNIA REPAIR    . JOINT REPLACEMENT    . LEFT HEART CATH AND CORS/GRAFTS ANGIOGRAPHY N/A 06/27/2017   Procedure: LEFT HEART CATH AND CORS/GRAFTS ANGIOGRAPHY;  Surgeon: [name], MD;  Location: [place] [facility];  Service: Cardiovascular;  Laterality: N/A;  . NASAL SINUS SURGERY    . SHOULDER OPEN ROTATOR CUFF REPAIR Right   . TOTAL KNEE ARTHROPLASTY Bilateral    Current Outpatient Medications on File Prior to Visit  Medication Sig Dispense Refill  . acetaminophen (TYLENOL) 325 MG tablet Take 1-2 tablets (325-650 mg total) by mouth every 4 (four) hours as needed for mild pain.    Marland Kitchen albuterol (VENTOLIN HFA) 108 (90 Base) MCG/ACT inhaler Inhale 2 puffs into the lungs every 4 (four) hours as needed for wheezing or shortness of breath. 1 Inhaler 0  . ALPRAZolam (XANAX) 0.5 MG tablet Take 0.5 tablets (0.25 mg total) by mouth 3 (three) times daily as needed for anxiety. 30 tablet 0  . aspirin EC 81 MG EC tablet Take 1 tablet (81 mg total) by mouth daily.    . camphor-menthol (SARNA) lotion Apply 1 application topically daily as needed for itching.    . furosemide (LASIX) 20 MG tablet Take 1 tablet (20 mg total) by mouth daily. 30 tablet 0  . Lifitegrast (XIIDRA) 5 % SOLN Place 1-2 drops into both eyes daily.     . magnesium oxide (MAG-OX) 400 (241.3 Mg) MG tablet Take 1 tablet (400 mg total) by mouth 2 (two) times daily. 180 tablet 3  . metoprolol tartrate (LOPRESSOR) 25 MG tablet Take 0.5 tablets (12.5 mg total) by mouth 2 (two) times daily. 90 tablet 3  . nitroGLYCERIN (NITROSTAT) 0.4 MG SL tablet Place 1 tablet (0.4 mg total)  under the tongue every 5 (five)  minutes as needed for chest pain. 25 tablet 2  . pantoprazole (PROTONIX) 40 MG tablet Take 1 tablet (40 mg total) by mouth every other day. 30 tablet 0  . polyethylene glycol (MIRALAX / GLYCOLAX) 17 g packet Take 17 g by mouth daily as needed for mild constipation. 14 each 0  . rosuvastatin (CRESTOR) 5 MG tablet Take 1 tablet (5 mg total) by mouth daily at 6 PM. 30 tablet 0  . tamsulosin (FLOMAX) 0.4 MG CAPS capsule Take 1 capsule (0.4 mg total) by mouth daily. 30 capsule 0   No current facility-administered medications on file prior to visit.    Allergies  Allergen Reactions  . Feldene [Piroxicam] Other (See Comments)    REACTION: Blisters  . Statins Other (See Comments)    Myalgias  . Doxycycline Other (See Comments)    REACTION:  unknown  . Latex Rash  . Tequin Anxiety  . Valium Other (See Comments)    REACTION:  unknown   Social History   Socioeconomic History  . Marital status: Widowed    Spouse name: Not on file  . Number of children: Not on file  . Years of education: Not on file  . Highest education level: Not on file  Occupational History  . Not on file  Social Needs  . Financial resource strain: Not on file  . Food insecurity    Worry: Not on file    Inability: Not on file  . Transportation needs    Medical: Not on file    Non-medical: Not on file  Tobacco Use  . Smoking status: Never Smoker  . Smokeless tobacco: Former Systems developer    Types: Chew  . Tobacco comment: "quit chewing in the 1960s"  Substance and Sexual Activity  . Alcohol use: No  . Drug use: No  . Sexual activity: Never  Lifestyle  . Physical activity    Days per week: Not on file    Minutes per session: Not on file  . Stress: Not on file  Relationships  . Social Herbalist on phone: Not on file    Gets together: Not on file    Attends religious service: Not on file    Active member of club or organization: Not on file    Attends meetings of clubs or  organizations: Not on file    Relationship status: Not on file  . Intimate partner violence    Fear of current or ex partner: Not on file    Emotionally abused: Not on file    Physically abused: Not on file    Forced sexual activity: Not on file  Other Topics Concern  . Not on file  Social History Narrative   01-05-18 Unable to ask abuse questions family with him today.   11-012-19 Unable to ask abuse questions family with him today.      Review of Systems  All other systems reviewed and are negative.      Objective:   Physical Exam Constitutional:      Appearance: He is underweight.  HENT:     Right Ear: Decreased hearing noted.     Left Ear: Decreased hearing noted.     Nose: Nose normal. No congestion or rhinorrhea.  Cardiovascular:     Rate and Rhythm: Bradycardia present. Rhythm irregular.  Pulmonary:     Effort: Pulmonary effort is normal. No respiratory distress.     Breath sounds: No wheezing or rales.  Abdominal:  General: Bowel sounds are normal.     Palpations: Abdomen is soft.  Musculoskeletal:     Right lower leg: No edema.     Left lower leg: Edema present.     Left foot: Normal range of motion. Tenderness and bony tenderness present. No crepitus, deformity or laceration.  Neurological:     Mental Status: He is alert and oriented to person, place, and time.     Motor: Weakness present.     Gait: Gait abnormal.  Psychiatric:        Mood and Affect: Mood normal.        Behavior: Behavior normal.   In wheelchair        Assessment & Plan:  1. Frequent urination Differential diagnosis includes BPH with lower urinary tract symptoms versus overactive bladder.  First we will try increasing his Flomax to 0.8 mg p.o. nightly.  Reassess in 1 to 2 weeks.  If no better, we will try adding Vesicare 10 mg p.o. daily for possible bladder spasm/overactive bladder. - Urinalysis, Routine w reflex microscopic  2. Podagra Suspect gout as a cause of his first  MTP joint pain and swelling.  Try prednisone taper pack and check a uric acid level.  If no better obtain an x-ray of the foot - Uric acid - BASIC METABOLIC PANEL WITH GFR  3. Acquired dysmorphic toenail Trimmed all 10 toenails with a pair of rongeurs  4. Chronic venous insufficiency No further treatment is necessary for the edema.  Reassured the patient and his daughter that this is physiologic and not a sign of a serious underlying medical illness

## 2018-10-16 ENCOUNTER — Other Ambulatory Visit: Payer: Self-pay | Admitting: Family Medicine

## 2018-10-16 DIAGNOSIS — I2510 Atherosclerotic heart disease of native coronary artery without angina pectoris: Secondary | ICD-10-CM | POA: Diagnosis not present

## 2018-10-16 DIAGNOSIS — I509 Heart failure, unspecified: Secondary | ICD-10-CM | POA: Diagnosis not present

## 2018-10-16 DIAGNOSIS — I69218 Other symptoms and signs involving cognitive functions following other nontraumatic intracranial hemorrhage: Secondary | ICD-10-CM | POA: Diagnosis not present

## 2018-10-16 DIAGNOSIS — I4891 Unspecified atrial fibrillation: Secondary | ICD-10-CM | POA: Diagnosis not present

## 2018-10-16 DIAGNOSIS — N183 Chronic kidney disease, stage 3 (moderate): Secondary | ICD-10-CM | POA: Diagnosis not present

## 2018-10-16 DIAGNOSIS — C3431 Malignant neoplasm of lower lobe, right bronchus or lung: Secondary | ICD-10-CM | POA: Diagnosis not present

## 2018-10-16 DIAGNOSIS — I13 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 failure and stage 1 through stage 4 chronic kidney disease, or unspecified chronic kidney disease: Secondary | ICD-10-CM | POA: Diagnosis not present

## 2018-10-16 DIAGNOSIS — M1A0720 Idiopathic chronic gout, left ankle and foot, without tophus (tophi): Secondary | ICD-10-CM | POA: Diagnosis not present

## 2018-10-16 DIAGNOSIS — J449 Chronic obstructive pulmonary disease, unspecified: Secondary | ICD-10-CM | POA: Diagnosis not present

## 2018-10-16 LAB — BASIC METABOLIC PANEL WITH GFR
BUN/Creatinine Ratio: 16 (calc) (ref 6–22)
BUN: 20 mg/dL (ref 7–25)
CO2: 25 mmol/L (ref 20–32)
Calcium: 9.5 mg/dL (ref 8.6–10.3)
Chloride: 102 mmol/L (ref 98–110)
Creat: 1.23 mg/dL — ABNORMAL HIGH (ref 0.70–1.11)
GFR, Est African American: 60 mL/min/{1.73_m2} (ref >=60)
GFR, Est Non African American: 52 mL/min/{1.73_m2} — ABNORMAL LOW (ref >=60)
Glucose, Bld: 91 mg/dL (ref 65–99)
Potassium: 4.3 mmol/L (ref 3.5–5.3)
Sodium: 138 mmol/L (ref 135–146)

## 2018-10-16 LAB — URIC ACID: Uric Acid, Serum: 7.3 mg/dL (ref 4.0–8.0)

## 2018-10-18 DIAGNOSIS — I69218 Other symptoms and signs involving cognitive functions following other nontraumatic intracranial hemorrhage: Secondary | ICD-10-CM | POA: Diagnosis not present

## 2018-10-18 DIAGNOSIS — N183 Chronic kidney disease, stage 3 (moderate): Secondary | ICD-10-CM | POA: Diagnosis not present

## 2018-10-18 DIAGNOSIS — I2510 Atherosclerotic heart disease of native coronary artery without angina pectoris: Secondary | ICD-10-CM | POA: Diagnosis not present

## 2018-10-18 DIAGNOSIS — J449 Chronic obstructive pulmonary disease, unspecified: Secondary | ICD-10-CM | POA: Diagnosis not present

## 2018-10-18 DIAGNOSIS — M1A0720 Idiopathic chronic gout, left ankle and foot, without tophus (tophi): Secondary | ICD-10-CM | POA: Diagnosis not present

## 2018-10-18 DIAGNOSIS — I4891 Unspecified atrial fibrillation: Secondary | ICD-10-CM | POA: Diagnosis not present

## 2018-10-18 DIAGNOSIS — C3431 Malignant neoplasm of lower lobe, right bronchus or lung: Secondary | ICD-10-CM | POA: Diagnosis not present

## 2018-10-18 DIAGNOSIS — I509 Heart failure, unspecified: Secondary | ICD-10-CM | POA: Diagnosis not present

## 2018-10-18 DIAGNOSIS — I13 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 failure and stage 1 through stage 4 chronic kidney disease, or unspecified chronic kidney disease: Secondary | ICD-10-CM | POA: Diagnosis not present

## 2018-10-22 ENCOUNTER — Telehealth: Payer: Self-pay

## 2018-10-22 MED ORDER — COLCHICINE 0.6 MG PO TABS: 0.6000 mg | ORAL_TABLET | Freq: Every day | ORAL | 3 refills | Status: DC

## 2018-10-22 NOTE — Telephone Encounter (Signed)
Pt notified and Rx sent to pharmacy.

## 2018-10-22 NOTE — Telephone Encounter (Signed)
Take colchicine 0.6, 1 tab poqday for gout prevention.  Can take mucinex for chest congestion.  If fever or sob, needs covid test

## 2018-10-22 NOTE — Telephone Encounter (Signed)
Pt's wife called to report that the prednisone has worked that was prescribed and she states you would call pt in something long term.  Pt's wife also reports that pt has had congestion in chest, and coughing since 10/17/2018. Reports no fever or exposed to covid. Coughing up dark yellow muscous and rattle in throat. No meds taken. Please advise.

## 2018-10-23 ENCOUNTER — Telehealth: Payer: Self-pay | Admitting: Nurse Practitioner

## 2018-10-23 ENCOUNTER — Ambulatory Visit (INDEPENDENT_AMBULATORY_CARE_PROVIDER_SITE_OTHER): Payer: Medicare HMO | Admitting: Family Medicine

## 2018-10-23 ENCOUNTER — Other Ambulatory Visit: Payer: Self-pay

## 2018-10-23 ENCOUNTER — Encounter: Payer: Self-pay | Admitting: Family Medicine

## 2018-10-23 VITALS — BP 98/56 | HR 59 | Temp 97.7°F | Resp 18 | Ht 68.0 in

## 2018-10-23 DIAGNOSIS — C3431 Malignant neoplasm of lower lobe, right bronchus or lung: Secondary | ICD-10-CM | POA: Diagnosis not present

## 2018-10-23 DIAGNOSIS — I509 Heart failure, unspecified: Secondary | ICD-10-CM | POA: Diagnosis not present

## 2018-10-23 DIAGNOSIS — J441 Chronic obstructive pulmonary disease with (acute) exacerbation: Secondary | ICD-10-CM

## 2018-10-23 DIAGNOSIS — R399 Unspecified symptoms and signs involving the genitourinary system: Secondary | ICD-10-CM | POA: Diagnosis not present

## 2018-10-23 DIAGNOSIS — I69218 Other symptoms and signs involving cognitive functions following other nontraumatic intracranial hemorrhage: Secondary | ICD-10-CM | POA: Diagnosis not present

## 2018-10-23 DIAGNOSIS — I4891 Unspecified atrial fibrillation: Secondary | ICD-10-CM | POA: Diagnosis not present

## 2018-10-23 DIAGNOSIS — M1A0720 Idiopathic chronic gout, left ankle and foot, without tophus (tophi): Secondary | ICD-10-CM | POA: Diagnosis not present

## 2018-10-23 DIAGNOSIS — N183 Chronic kidney disease, stage 3 (moderate): Secondary | ICD-10-CM | POA: Diagnosis not present

## 2018-10-23 DIAGNOSIS — J449 Chronic obstructive pulmonary disease, unspecified: Secondary | ICD-10-CM | POA: Diagnosis not present

## 2018-10-23 DIAGNOSIS — I2510 Atherosclerotic heart disease of native coronary artery without angina pectoris: Secondary | ICD-10-CM | POA: Diagnosis not present

## 2018-10-23 DIAGNOSIS — I13 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 failure and stage 1 through stage 4 chronic kidney disease, or unspecified chronic kidney disease: Secondary | ICD-10-CM | POA: Diagnosis not present

## 2018-10-23 LAB — URINALYSIS, ROUTINE W REFLEX MICROSCOPIC
Bilirubin Urine: NEGATIVE
Glucose, UA: NEGATIVE
Hyaline Cast: NONE SEEN /LPF
Ketones, ur: NEGATIVE
Nitrite: POSITIVE — AB
Specific Gravity, Urine: 1.015 (ref 1.001–1.03)
Squamous Epithelial / HPF: NONE SEEN /HPF (ref <=5)
WBC, UA: >=60 /HPF — AB (ref 0–5)
pH: 7 (ref 5.0–8.0)

## 2018-10-23 LAB — MICROSCOPIC MESSAGE

## 2018-10-23 MED ORDER — AZITHROMYCIN 250 MG PO TABS: ORAL_TABLET | ORAL | 0 refills | Status: DC

## 2018-10-23 MED ORDER — PREDNISONE 20 MG PO TABS: 40.0000 mg | ORAL_TABLET | Freq: Every day | ORAL | 0 refills | Status: DC

## 2018-10-23 MED ORDER — PREDNISONE 20 MG PO TABS: 40.0000 mg | ORAL_TABLET | Freq: Every day | ORAL | 0 refills | Status: AC

## 2018-10-23 MED ORDER — CEPHALEXIN 500 MG PO CAPS: 500.0000 mg | ORAL_CAPSULE | Freq: Three times a day (TID) | ORAL | 0 refills | Status: DC

## 2018-10-23 NOTE — Telephone Encounter (Signed)
I called Evan [name], Mr Finnigan daughter, no answer at home or cell phone. Unable to leave a message. Will continue to attempt to recontact to reschedule today's scheduled palliative care follow-up visit.

## 2018-10-23 NOTE — Patient Instructions (Signed)
Treat COPD exacerbation with steroids and Zpak and do nebulizer treatments at home more often - every 4-6 hours as needed for cough, wheeze, tightness in chest or shortness of breath  Treat UTI with keflex antibiotics  Follow up with [name] in 1 week to recheck everything  If any worsening (short of breath, nausea/vomiting, confusion, lethargy) - go to the ER for evaluation.  I will call the pharmacy to consult about decreasing your colchicine temporarily while taking the Zpak.

## 2018-10-23 NOTE — Progress Notes (Signed)
Patient ID: Evan [name], male    DOB: 1927/04/11, 85 y.o.   MRN: [id number]  PCP: [name], MD  Chief Complaint  Patient presents with  . Urinary Tract Infection    polyuria, cloudy and odor urine  . Nasal Congestion    chest congestion, started 07/16    Subjective:   Evan [name] is a 85 y.o. male, presents to clinic with CC of urinary frequency, cloudy with odor. Urinary frequency got a litle better for only two nights after doing changes per direction of PCP   until 2 night ago urine changed, he was uncomfortable, more frequency, foul odor.  His daughter (?) is with him lending hx, pt have very few complaints except for CC of cough. Complains of chest congestion and wheezing started last week 5 days ago, now has productive cough with yellow sputum.  Denies nasal sx, sore throat, CP, SOB. Per family member here today he is eating and drinking normally.  Normal activity and he is at his baseline mental status.  No fever, sweats, N, V, D, respiratory distress, weight changes, color changes.  Patient Active Problem List   Diagnosis Date Noted  . Weakness generalized 09/28/2018  . Palliative care encounter 09/28/2018  . Pressure injury of skin 08/27/2018  . COPD (chronic obstructive pulmonary disease) ([place]) 08/26/2018  . AMS (altered mental status) 08/22/2018  . Altered mental status 08/21/2018  . UTI (urinary tract infection) 08/21/2018  . Abnormality of gait 08/20/2018  . Prolonged Q-T interval on ECG   . Dysphagia, post-stroke   . Supplemental oxygen dependent   . Anxiety state   . Fatigue   . SOB (shortness of breath)   . Hyperglycemia   . History of lung cancer   . Depression   . CKD (chronic kidney disease), stage III ([place])   . Acute blood loss anemia   . Fall 07/16/2018  . 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 Acute spontaneous 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 ([place]) L thalamic  07/14/2018  . Cellulitis of arm, right 06/01/2018  . Rash and nonspecific skin eruption 06/01/2018  . Chest pain 02/02/2018  . Thrombocytopenia ([place]) 02/02/2018  . Hypokalemia 02/02/2018  . Pulmonary embolism ([place]) 02/02/2018  . Elevated troponin 02/02/2018  . Pulmonary embolus ([place]) 02/02/2018  . Non-ST elevation MI (NSTEMI) ([place]) 06/27/2017  . Diastolic CHF ([place]) 95/18/8416  . Chest pain due to CAD ([place]) 06/26/2017  . Dyspnea 06/21/2017  . Acute bronchitis 05/15/2017  . Oral candidiasis 05/15/2017  . Acute pericarditis   . Chest pain at rest 12/20/2016  . Angina pectoris ([place]) 12/20/2016  . Radiation pneumonitis ([place]) 11/09/2016  . Pleurisy 10/11/2016  . Chest wall pain 10/11/2016  . Atrial fibrillation ([place]) 05/21/2016  . Adenocarcinoma of right lung ([place]) 01/06/2016  . GAD (generalized anxiety disorder) 11/20/2015  . Essential hypertension   . Cellulitis 09/04/2015  . Cellulitis of left axilla   . Esophageal ulcer without bleeding   . Bilateral lower extremity edema 04/28/2014  . BPH (benign prostatic hyperplasia) 10/08/2012  . Anxiety   . EKG abnormalities 09/05/2012  . Tremor 09/05/2012  . Chronic fatigue 09/05/2012  . Arthritis   . Asthma, chronic   . Reflux esophagitis   . S/P CABG (coronary artery bypass graft)   . GERD (gastroesophageal reflux disease)   . Hyperlipidemia   . Hypertension   . UNSPECIFIED PERIPHERAL VASCULAR DISEASE 06/16/2009     Prior to Admission  medications   Medication Sig Start Date End Date Taking? Authorizing Provider  acetaminophen (TYLENOL) 325 MG tablet Take 1-2 tablets (325-650 mg total) by mouth every 4 (four) hours as needed for mild pain. 08/09/18  Yes Love, Ivan Anchors, PA-C  albuterol (VENTOLIN HFA) 108 (90 Base) MCG/ACT inhaler Inhale 2 puffs into the lungs every 4 (four) hours as needed for wheezing or shortness of breath. 08/24/18  Yes Aline August, MD  ALPRAZolam Duanne Moron) 0.5 MG tablet Take 0.5 tablets (0.25 mg total) by mouth  3 (three) times daily as needed for anxiety. 08/31/18  Yes [name], MD  aspirin EC 81 MG EC tablet Take 1 tablet (81 mg total) by mouth daily. 08/14/18  Yes Angiulli, Lavon Paganini, PA-C  camphor-menthol [facility]) lotion Apply 1 application topically daily as needed for itching. 08/24/18  Yes Aline August, MD  colchicine 0.6 MG tablet Take 1 tablet (0.6 mg total) by mouth daily. 10/22/18  Yes [name], MD  furosemide (LASIX) 20 MG tablet Take 1 tablet (20 mg total) by mouth daily. 08/14/18  Yes Angiulli, Lavon Paganini, PA-C  Lifitegrast Shirley Friar) 5 % SOLN Place 1-2 drops into both eyes daily.    Yes [provider]  magnesium oxide (MAG-OX) 400 (241.3 Mg) MG tablet Take 1 tablet (400 mg total) by mouth 2 (two) times daily. 08/14/18  Yes Angiulli, Lavon Paganini, PA-C  metoprolol tartrate (LOPRESSOR) 25 MG tablet Take 0.5 tablets (12.5 mg total) by mouth 2 (two) times daily. 10/04/18  Yes [name], MD  mirabegron ER (MYRBETRIQ) 25 MG TB24 tablet Take 1 tablet (25 mg total) by mouth daily. 10/16/18  Yes [name], MD  nitroGLYCERIN (NITROSTAT) 0.4 MG SL tablet Place 1 tablet (0.4 mg total) under the tongue every 5 (five) minutes as needed for chest pain. 12/06/17  Yes Daune Perch, NP  pantoprazole (PROTONIX) 40 MG tablet Take 1 tablet (40 mg total) by mouth every other day. 08/14/18  Yes Angiulli, Lavon Paganini, PA-C  polyethylene glycol (MIRALAX / GLYCOLAX) 17 g packet Take 17 g by mouth daily as needed for mild constipation. 08/14/18  Yes Angiulli, Lavon Paganini, PA-C  rosuvastatin (CRESTOR) 5 MG tablet Take 1 tablet (5 mg total) by mouth daily at 6 PM. 08/14/18  Yes Angiulli, Lavon Paganini, PA-C  tamsulosin (FLOMAX) 0.4 MG CAPS capsule Take 1 capsule (0.4 mg total) by mouth daily. 08/14/18  Yes Angiulli, Lavon Paganini, PA-C     Allergies  Allergen Reactions  . Feldene [Piroxicam] Other (See Comments)    REACTION: Blisters  . Statins Other (See Comments)    Myalgias  . Doxycycline Other (See  Comments)    REACTION:  unknown  . Latex Rash  . Tequin Anxiety  . Valium Other (See Comments)    REACTION:  unknown     Family History  Problem Relation Age of Onset  . Arthritis-Osteo Sister   . Heart attack Brother   . Heart attack Other   . Cancer Neg Hx      Social History   Socioeconomic History  . Marital status: Widowed    Spouse name: Not on file  . Number of children: Not on file  . Years of education: Not on file  . Highest education level: Not on file  Occupational History  . Not on file  Social Needs  . Financial resource strain: Not on file  . Food insecurity    Worry: Not on file    Inability: Not on file  .  Transportation needs    Medical: Not on file    Non-medical: Not on file  Tobacco Use  . Smoking status: Never Smoker  . Smokeless tobacco: Former Systems developer    Types: Chew  . Tobacco comment: "quit chewing in the 1960s"  Substance and Sexual Activity  . Alcohol use: No  . Drug use: No  . Sexual activity: Never  Lifestyle  . Physical activity    Days per week: Not on file    Minutes per session: Not on file  . Stress: Not on file  Relationships  . Social Herbalist on phone: Not on file    Gets together: Not on file    Attends religious service: Not on file    Active member of club or organization: Not on file    Attends meetings of clubs or organizations: Not on file    Relationship status: Not on file  . Intimate partner violence    Fear of current or ex partner: Not on file    Emotionally abused: Not on file    Physically abused: Not on file    Forced sexual activity: Not on file  Other Topics Concern  . Not on file  Social History Narrative   01-05-18 Unable to ask abuse questions family with him today.   11-012-19 Unable to ask abuse questions family with him today.     Review of Systems  Constitutional: Negative.   HENT: Negative.   Eyes: Negative.   Respiratory: Negative.   Cardiovascular: Negative.    Gastrointestinal: Negative.   Endocrine: Negative.   Genitourinary: Negative.   Musculoskeletal: Negative.   Skin: Negative.   Allergic/Immunologic: Negative.   Neurological: Negative.   Hematological: Negative.   Psychiatric/Behavioral: Negative.   All other systems reviewed and are negative.      Objective:    Vitals:   10/23/18 1123 10/23/18 1159  BP: (!) 98/56   Pulse: 72 (!) 59  Resp: 18   Temp: 97.7 F (36.5 C)   TempSrc: Oral   SpO2:  99%  Height: 5\' 8"  (1.727 m)       Physical Exam Vitals signs and nursing note reviewed.  Constitutional:      General: He is not in acute distress.    Appearance: He is well-developed. He is not toxic-appearing or diaphoretic.     Comments: Elderly male, alert, HOH, NAD, non-toxic appearing  HENT:     Head: Normocephalic and atraumatic.     Right Ear: External ear normal.     Left Ear: External ear normal.     Nose: Nose normal. No congestion.     Mouth/Throat:     Mouth: Mucous membranes are moist.     Pharynx: Oropharynx is clear. Posterior oropharyngeal erythema present.  Eyes:     General:        Right eye: No discharge.        Left eye: No discharge.     Conjunctiva/sclera: Conjunctivae normal.  Neck:     Trachea: No tracheal deviation.  Cardiovascular:     Rate and Rhythm: Regular rhythm. Bradycardia present.     Pulses: Normal pulses.  Pulmonary:     Effort: Pulmonary effort is normal. No respiratory distress.     Breath sounds: No stridor. Rales present.  Abdominal:     General: Bowel sounds are normal. There is no distension.     Tenderness: There is no abdominal tenderness. There is no guarding or rebound.  Musculoskeletal:  Normal range of motion.  Skin:    General: Skin is warm and dry.     Findings: No rash.  Neurological:     Mental Status: He is alert.     Motor: No abnormal muscle tone.     Results for orders placed or performed in visit on 10/23/18  Urinalysis, Routine w reflex microscopic   Result Value Ref Range   Color, Urine YELLOW YELLOW   APPearance CLOUDY (A) CLEAR   Specific Gravity, Urine 1.015 1.001 - 1.03   pH 7.0 5.0 - 8.0   Glucose, UA NEGATIVE NEGATIVE   Bilirubin Urine NEGATIVE NEGATIVE   Ketones, ur NEGATIVE NEGATIVE   Hgb urine dipstick 2+ (A) NEGATIVE   Protein, ur 1+ (A) NEGATIVE   Nitrite POSITIVE (A) NEGATIVE   Leukocytes,Ua 3+ (A) NEGATIVE   WBC, UA > OR = 60 (A) 0 - 5 /HPF   RBC / HPF 3-10 (A) 0 - 2 /HPF   Squamous Epithelial / LPF NONE SEEN < OR = 5 /HPF   Bacteria, UA FEW (A) NONE SEEN /HPF   Hyaline Cast NONE SEEN NONE SEEN /LPF  Microscopic Message  Result Value Ref Range   Note         Assessment & Plan:   85 y/o with sx of and urine testing consistent with UTI.  Also cough, wheeze, lungs with rales and congestion, no wheeze, but sx consistent with past COPD per family member, will cover for UTI and CAP due to age.   Will need close follow up, and any signs of worsening respiration pt will need to go to the ER, should not go anywhere else with illness and respiratory sx.    Pt will also need to f/up later with PCP regarding bladder/BPH meds for management.    ICD-10-CM   1. UTI symptoms  R39.9 Urine Culture    Urinalysis, Routine w reflex microscopic    cephALEXin (KEFLEX) 500 MG capsule    Microscopic Message   +dip and microscopy, hx of UTI, last urine culture May 2020 for E. coli with multiple abx resistance, including cipro and ampicillin  2. COPD with acute exacerbation (HCC)  J44.1 azithromycin (ZITHROMAX) 250 MG tablet    predniSONE (DELTASONE) 20 MG tablet   tx with zpak (consulting pharmacist for colchicine dose adjustment for med interaction) steroids, nebs, close w/up with PCP    12:21 PM Spoke with pharmacist at [facility], he has not yet picked up the colchicine, she recommends starting colchicine after Zpak is finished.  He is on more steroids for COPD exacerbation, more steroids may cover his gout inflammation  until he can start uric acid lowering meds.   Pt saw PCP about a week ago with same sx - plan at that time was the following: 1. Frequent urination Differential diagnosis includes BPH with lower urinary tract symptoms versus overactive bladder.  First we will try increasing his Flomax to 0.8 mg p.o. nightly.  Reassess in 1 to 2 weeks.  If no better, we will try adding Vesicare 10 mg p.o. daily for possible bladder spasm/overactive bladder.  Pt will need to f/up with PCP for urinary frequency and meds, since effectiveness complicated today with UTI.   Delsa Grana, PA-C 10/23/18 11:38 AM

## 2018-10-24 DIAGNOSIS — Z85118 Personal history of other malignant neoplasm of bronchus and lung: Secondary | ICD-10-CM | POA: Diagnosis not present

## 2018-10-24 DIAGNOSIS — J449 Chronic obstructive pulmonary disease, unspecified: Secondary | ICD-10-CM | POA: Diagnosis not present

## 2018-10-25 DIAGNOSIS — I509 Heart failure, unspecified: Secondary | ICD-10-CM | POA: Diagnosis not present

## 2018-10-25 DIAGNOSIS — N183 Chronic kidney disease, stage 3 (moderate): Secondary | ICD-10-CM | POA: Diagnosis not present

## 2018-10-25 DIAGNOSIS — M1A0720 Idiopathic chronic gout, left ankle and foot, without tophus (tophi): Secondary | ICD-10-CM | POA: Diagnosis not present

## 2018-10-25 DIAGNOSIS — I2510 Atherosclerotic heart disease of native coronary artery without angina pectoris: Secondary | ICD-10-CM | POA: Diagnosis not present

## 2018-10-25 DIAGNOSIS — C3431 Malignant neoplasm of lower lobe, right bronchus or lung: Secondary | ICD-10-CM | POA: Diagnosis not present

## 2018-10-25 DIAGNOSIS — I13 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 failure and stage 1 through stage 4 chronic kidney disease, or unspecified chronic kidney disease: Secondary | ICD-10-CM | POA: Diagnosis not present

## 2018-10-25 DIAGNOSIS — J449 Chronic obstructive pulmonary disease, unspecified: Secondary | ICD-10-CM | POA: Diagnosis not present

## 2018-10-25 DIAGNOSIS — I69218 Other symptoms and signs involving cognitive functions following other nontraumatic intracranial hemorrhage: Secondary | ICD-10-CM | POA: Diagnosis not present

## 2018-10-25 DIAGNOSIS — I4891 Unspecified atrial fibrillation: Secondary | ICD-10-CM | POA: Diagnosis not present

## 2018-10-26 ENCOUNTER — Other Ambulatory Visit: Payer: Self-pay | Admitting: Family Medicine

## 2018-10-26 LAB — URINE CULTURE
MICRO NUMBER:: 688472
SPECIMEN QUALITY:: ADEQUATE

## 2018-10-26 MED ORDER — AMOXICILLIN-POT CLAVULANATE 875-125 MG PO TABS: 1.0000 | ORAL_TABLET | Freq: Two times a day (BID) | ORAL | 0 refills | Status: AC

## 2018-10-29 DIAGNOSIS — I2510 Atherosclerotic heart disease of native coronary artery without angina pectoris: Secondary | ICD-10-CM | POA: Diagnosis not present

## 2018-10-29 DIAGNOSIS — J449 Chronic obstructive pulmonary disease, unspecified: Secondary | ICD-10-CM | POA: Diagnosis not present

## 2018-10-29 DIAGNOSIS — I69218 Other symptoms and signs involving cognitive functions following other nontraumatic intracranial hemorrhage: Secondary | ICD-10-CM | POA: Diagnosis not present

## 2018-10-29 DIAGNOSIS — C3431 Malignant neoplasm of lower lobe, right bronchus or lung: Secondary | ICD-10-CM | POA: Diagnosis not present

## 2018-10-29 DIAGNOSIS — N183 Chronic kidney disease, stage 3 (moderate): Secondary | ICD-10-CM | POA: Diagnosis not present

## 2018-10-29 DIAGNOSIS — I4891 Unspecified atrial fibrillation: Secondary | ICD-10-CM | POA: Diagnosis not present

## 2018-10-29 DIAGNOSIS — I13 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 failure and stage 1 through stage 4 chronic kidney disease, or unspecified chronic kidney disease: Secondary | ICD-10-CM | POA: Diagnosis not present

## 2018-10-29 DIAGNOSIS — I509 Heart failure, unspecified: Secondary | ICD-10-CM | POA: Diagnosis not present

## 2018-10-29 DIAGNOSIS — M1A0720 Idiopathic chronic gout, left ankle and foot, without tophus (tophi): Secondary | ICD-10-CM | POA: Diagnosis not present

## 2018-10-30 ENCOUNTER — Ambulatory Visit (INDEPENDENT_AMBULATORY_CARE_PROVIDER_SITE_OTHER): Payer: Medicare HMO | Admitting: Family Medicine

## 2018-10-30 ENCOUNTER — Other Ambulatory Visit: Payer: Self-pay

## 2018-10-30 ENCOUNTER — Encounter: Payer: Self-pay | Admitting: Family Medicine

## 2018-10-30 VITALS — BP 100/70 | HR 80 | Temp 97.9°F | Resp 14 | Ht 68.0 in | Wt 164.0 lb

## 2018-10-30 DIAGNOSIS — N390 Urinary tract infection, site not specified: Secondary | ICD-10-CM | POA: Diagnosis not present

## 2018-10-30 DIAGNOSIS — B9620 Unspecified Escherichia coli [E. coli] as the cause of diseases classified elsewhere: Secondary | ICD-10-CM

## 2018-10-30 LAB — URINALYSIS, ROUTINE W REFLEX MICROSCOPIC
Bilirubin Urine: NEGATIVE
Glucose, UA: NEGATIVE
Hgb urine dipstick: NEGATIVE
Ketones, ur: NEGATIVE
Leukocytes,Ua: NEGATIVE
Nitrite: NEGATIVE
Protein, ur: NEGATIVE
Specific Gravity, Urine: 1.02 (ref 1.001–1.03)
pH: 6.5 (ref 5.0–8.0)

## 2018-10-30 NOTE — Progress Notes (Signed)
Subjective:    Patient ID: Evan [name], male    DOB: 1927-08-14, 85 y.o.   MRN: [id number]  HPI Since I last saw the patient, several things have happened.  Patient was on lifelong Lovenox after suffering a pulmonary embolism presumed due to hypercoagulable state brought on by his lung cancer.  The pulmonary embolism occurred while the patient was taking Eliquis prompting the lifelong administration of Lovenox.  However, patient suffered an intracranial hemorrhage as well as a fall.  It is uncertain as to whether the fall led to the hemorrhage or whether the hemorrhage led to the fall.  However due to this, the patient was hospitalized for more than a month in inpatient rehab.  Obviously his Lovenox was discontinued and at the current time he is only taking an aspirin.  Patient has atrial fibrillation as well as a history of a pulmonary embolism but at the present time he has not been cleared to resume any anticoagulant beyond the aspirin.  During that time, the patient lost the use of the right side of his body.  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  Due to the COVID-19 pandemic, his family was unable to visit him in the hospital.  All this is extremely disorienting to an 85 year old individual.  Prior to being hospitalized, the patient was taking Xanax 2-3 times a day.  He had been taking this for more than 15 years.  He came to me on this dosage and I have been his doctor for 12 years.  Patient had longstanding dependency to Xanax and benzodiazepine due to anxiety.  This was obviously discontinued after his fall and intracranial hemorrhage primarily for neuro checks but also to reduce the risk of confusion as the patient was also suffering metabolic encephalopathy.  He has not resumed this medication since he went home.  Since going home from the hospital however EMS has been called at least 3 times due to sudden attacks of dyspnea.  He has been back to the hospital at least  twice once with metabolic encephalopathy due to urinary tract infection as well as subjective dyspnea.  The second time was for dyspnea for unknown causes.  He was treated empirically as a COPD exacerbation but based on the discharge summary the findings were not impressive for COPD as a potential cause.  Currently he is using albuterol in the morning regardless of symptoms and then nothing later in the day.  He continues to have attacks of dyspnea having only been home from the hospital for 3 to 4 days.  Both I and his daughter believe a lot of this is anxiety and panic due to the situation that has occurred and the abrupt discontinuation of his longstanding benzodiazepine.  At that time, my plan was: I spent over 30 minutes today with the patient and his daughter reviewing his recent discharge summaries as well as the events that have occurred and transpired over the last 2 months.  This is a difficult situation.  Patient has numerous risk factors for dyspnea including COPD, atrial fibrillation, history of coronary artery disease, history of a pulmonary embolism, lung cancer with persistent right hilar lymphadenopathy.  However I truly believe the majority of his dyspnea is likely anxiety related for the factors I have outlined in the history of present illness.  I had a long discussion today with his daughter about the risk of resuming Xanax.  The patient is somewhat confused.  He is  also in a wheelchair due to weakness and balance issues after his intracranial hemorrhage.  I explained that benzodiazepines can exacerbate confusion.  They can exacerbate falls and balance issues.  However the patient has been rushed to the hospital several times in the last month due to dyspnea with no source identified.  EMS is also been called to the home several times for dyspnea even when the patient is in no apparent respiratory distress.  I believe these are likely panic attacks.  It is apparent to me that the patient is  approaching the end of his life.  He has wasted dramatically since his last visit here due to everything that is occurred.  As I explained to his daughter, I would like to focus on his comfort and quality of life.  Despite the risk of resuming benzodiazepines, I do believe this would likely improve his quality of life and lessen his subjective dyspnea.  Therefore I gave the patient a prescription for Xanax 0.5 mg.  Have instructed his daughter to give him 1/2 tablet every 8 hours as needed for panic or dyspnea.  We will also try the patient on Anoro 1 inhalation a day in case COPD is playing a role in his shortness of breath.  I also recommended they reduce his metoprolol to 1/2 tablet daily due to bradycardia on his exam today and low blood pressure and in 1 week continue the medication altogether as long as his heart rate remains low and his blood pressure remains low.  10/15/18 Since being home from the hospital, the patient's daughter states that every night, he has to go to the bathroom almost every hour.  He feels a sudden urge to urinate.  However after going to the bathroom, his symptoms returned shortly thereafter.  He reports a normal urinary stream.  At times this week however the majority the time he is able to make urine.  He is already on Flomax 0.4 mg p.o. nightly.  However this started abruptly after discharge from the hospital.  Urinalysis today shows no leukocyte esterase.  It shows no nitrites or signs of urinary tract infection.  Differential diagnosis would be BPH with lower urinary tract symptoms versus overactive bladder/bladder spasms.  He does not have any sudden urge to urinate during the daytime.  Is primarily at night once he is supine in bed.  He also has trace pitting edema in his left leg greater than his right leg.  This was ruled out to be a DVT in the hospital however he has numerous varicose veins in his leg and I believe he has some pitting edema secondary to venous  insufficiency.  However in his left foot he has significant pain to minimal palpation over the midfoot and significant pain over the first MTP joint.  It hurts simply to touch it.  There is some mild swelling around the MTP joint.  He has not had any x-rays.  He also has several thick yellow toenails that need to be trimmed.  His daughter asked me to trim all of his toenails today.  Using a pair of rongeurs, I trimmed all of his toenails.  At that time, my plan was: 1. Frequent urination Differential diagnosis includes BPH with lower urinary tract symptoms versus overactive bladder.  First we will try increasing his Flomax to 0.8 mg p.o. nightly.  Reassess in 1 to 2 weeks.  If no better, we will try adding Vesicare 10 mg p.o. daily for possible bladder spasm/overactive bladder. -  Urinalysis, Routine w reflex microscopic  2. Podagra Suspect gout as a cause of his first MTP joint pain and swelling.  Try prednisone taper pack and check a uric acid level.  If no better obtain an x-ray of the foot - Uric acid - BASIC METABOLIC PANEL WITH GFR  3. Acquired dysmorphic toenail Trimmed all 10 toenails with a pair of rongeurs  4. Chronic venous insufficiency No further treatment is necessary for the edema.  Reassured the patient and his daughter that this is physiologic and not a sign of a serious underlying medical illness  10/30/18 Patient's insurance would not cover Vesicare.  However he will cover Myrbetriq 25 mg a day.  He has not been taking this however.  His daughter was afraid to give him the medication.  He has completed Keflex and is recently started the Augmentin.  His daughter states that the color of the urine has cleared.  The smell has improved.  However the frequency continues to be the same.  This is his third urinary tract infection in less than 2 months.  This raises the concern that his recurrent urinary tract infections may be due to seeding from another source such as chronic  prostatitis.  He denies any rectal pain.  He denies any hematuria.  He denies any dysuria. Office Visit on 10/23/2018  Component Date Value Ref Range Status   MICRO NUMBER: 10/23/2018 [id number]   Final   SPECIMEN QUALITY: 10/23/2018 Adequate   Final   Sample Source 10/23/2018 URINE   Final   STATUS: 10/23/2018 FINAL   Final   ISOLATE 1: 10/23/2018 Escherichia coli*  Final   Greater than 100,000 CFU/mL of Escherichia coli   Color, Urine 10/23/2018 YELLOW  YELLOW Final   APPearance 10/23/2018 CLOUDY* CLEAR Final   Specific Gravity, Urine 10/23/2018 1.015  1.001 - 1.03 Final   pH 10/23/2018 7.0  5.0 - 8.0 Final   Glucose, UA 10/23/2018 NEGATIVE  NEGATIVE Final   Bilirubin Urine 10/23/2018 NEGATIVE  NEGATIVE Final   Ketones, ur 10/23/2018 NEGATIVE  NEGATIVE Final   Hgb urine dipstick 10/23/2018 2+* NEGATIVE Final   Protein, ur 10/23/2018 1+* NEGATIVE Final   Nitrite 10/23/2018 POSITIVE* NEGATIVE Final   Leukocytes,Ua 10/23/2018 3+* NEGATIVE Final   WBC, UA 10/23/2018 > OR = 60* 0 - 5 /HPF Final   RBC / HPF 10/23/2018 3-10* 0 - 2 /HPF Final   Squamous Epithelial / LPF 10/23/2018 NONE SEEN  < OR = 5 /HPF Final   Bacteria, UA 10/23/2018 FEW* NONE SEEN /HPF Final   Hyaline Cast 10/23/2018 NONE SEEN  NONE SEEN /LPF Final   Note 10/23/2018    Final   Comment: This urine was analyzed for the presence of WBC,  RBC, bacteria, casts, and other formed elements.  Only those elements seen were reported. . .    July 21, urinalysis was markedly abnormal and urine culture showed E. coli resistant to numerous antibiotics.  He was placed on Keflex.  However given the resistance pattern of the infection, I recommended switching to Augmentin 875 mg twice daily for 7 days.  He is here today to recheck.  Past Medical History:  Diagnosis Date   Adenocarcinoma of right lung ([place]) 01/06/2016   Anginal chest pain at rest [facility])    Chronic non, controlled on Lorazepam   Anxiety     Arthritis    "all over"    BPH (benign prostatic hyperplasia)    CAD (coronary artery disease)  Chronic lower back pain    Complication of anesthesia    "problems making water afterwards"   Depression    DJD (degenerative joint disease)    Esophageal ulcer without bleeding    bid ppi indefinitely   Family history of adverse reaction to anesthesia    "children w/PONV"   GERD (gastroesophageal reflux disease)    Headache    "couple/week maybe" (09/04/2015)   Hemochromatosis    Possible, elevated iron stores, hook-like osteophytes on hand films, normal LFTs   Hepatitis    "yellow jaundice as a baby"   History of ASCVD    MULTIVESSEL   Hyperlipidemia    Hypertension    Osteoarthritis    Pneumonia    "several times; got a little now" (09/04/2015)   Rheumatoid arthritis ([place])    Subdural hematoma ([place])    small after fall 09/2016-plavix held, neurosurgery consulted.  Asymptomatic.     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[place]) 02/2018   on Lovenox lifelong since he failed PO Eliquis   Past Surgical History:  Procedure Laterality Date   ARM SKIN LESION BIOPSY / EXCISION Left 09/02/2015   CATARACT EXTRACTION W/ INTRAOCULAR LENS  IMPLANT, BILATERAL Bilateral    CHOLECYSTECTOMY OPEN     CORNEAL TRANSPLANT Bilateral    "one at [facility]; one at [facility]"   [place] W/ STENT     DE stent ostium into the right radial free graft at [facility]-, 02-2007   ESOPHAGOGASTRODUODENOSCOPY N/A 11/09/2014   Procedure: ESOPHAGOGASTRODUODENOSCOPY (EGD);  Surgeon: [name], MD;  Location: [facility] ENDOSCOPY;  Service: Endoscopy;  Laterality: N/A;   INGUINAL HERNIA REPAIR     JOINT REPLACEMENT     LEFT HEART CATH AND CORS/GRAFTS ANGIOGRAPHY N/A 06/27/2017   Procedure: LEFT HEART CATH AND CORS/GRAFTS ANGIOGRAPHY;  Surgeon: [name], MD;  Location: [place] [facility];  Service: Cardiovascular;   Laterality: N/A;   NASAL SINUS SURGERY     SHOULDER OPEN ROTATOR CUFF REPAIR Right    TOTAL KNEE ARTHROPLASTY Bilateral    Current Outpatient Medications on File Prior to Visit  Medication Sig Dispense Refill   acetaminophen (TYLENOL) 325 MG tablet Take 1-2 tablets (325-650 mg total) by mouth every 4 (four) hours as needed for mild pain.     albuterol (VENTOLIN HFA) 108 (90 Base) MCG/ACT inhaler Inhale 2 puffs into the lungs every 4 (four) hours as needed for wheezing or shortness of breath. 1 Inhaler 0   ALPRAZolam (XANAX) 0.5 MG tablet Take 0.5 tablets (0.25 mg total) by mouth 3 (three) times daily as needed for anxiety. 30 tablet 0   amoxicillin-clavulanate (AUGMENTIN) 875-125 MG tablet Take 1 tablet by mouth 2 (two) times daily for 7 days. 14 tablet 0   aspirin EC 81 MG EC tablet Take 1 tablet (81 mg total) by mouth daily.     camphor-menthol (SARNA) lotion Apply 1 application topically daily as needed for itching.     colchicine 0.6 MG tablet Take 1 tablet (0.6 mg total) by mouth daily. 30 tablet 3   furosemide (LASIX) 20 MG tablet Take 1 tablet (20 mg total) by mouth daily. 30 tablet 0   Lifitegrast (XIIDRA) 5 % SOLN Place 1-2 drops into both eyes daily.      magnesium oxide (MAG-OX) 400 (241.3 Mg) MG tablet Take 1 tablet (400 mg total) by mouth 2 (two) times daily. 180 tablet 3   metoprolol  tartrate (LOPRESSOR) 25 MG tablet Take 0.5 tablets (12.5 mg total) by mouth 2 (two) times daily. 90 tablet 3   mirabegron ER (MYRBETRIQ) 25 MG TB24 tablet Take 1 tablet (25 mg total) by mouth daily. 30 tablet 5   nitroGLYCERIN (NITROSTAT) 0.4 MG SL tablet Place 1 tablet (0.4 mg total) under the tongue every 5 (five) minutes as needed for chest pain. 25 tablet 2   pantoprazole (PROTONIX) 40 MG tablet Take 1 tablet (40 mg total) by mouth every other day. 30 tablet 0   polyethylene glycol (MIRALAX / GLYCOLAX) 17 g packet Take 17 g by mouth daily as needed for mild constipation. 14 each 0    rosuvastatin (CRESTOR) 5 MG tablet Take 1 tablet (5 mg total) by mouth daily at 6 PM. 30 tablet 0   tamsulosin (FLOMAX) 0.4 MG CAPS capsule Take 1 capsule (0.4 mg total) by mouth daily. 30 capsule 0   No current facility-administered medications on file prior to visit.    Allergies  Allergen Reactions   Feldene [Piroxicam] Other (See Comments)    REACTION: Blisters   Statins Other (See Comments)    Myalgias   Doxycycline Other (See Comments)    REACTION:  unknown   Latex Rash   Tequin Anxiety   Valium Other (See Comments)    REACTION:  unknown   Social History   Socioeconomic History   Marital status: Widowed    Spouse name: Not on file   Number of children: Not on file   Years of education: Not on file   Highest education level: Not on file  Occupational History   Not on file  Social Needs   Financial resource strain: Not on file   Food insecurity    Worry: Not on file    Inability: Not on file   Transportation needs    Medical: Not on file    Non-medical: Not on file  Tobacco Use   Smoking status: Never Smoker   Smokeless tobacco: Former Systems developer    Types: Chew   Tobacco comment: "quit chewing in the 1960s"  Substance and Sexual Activity   Alcohol use: No   Drug use: No   Sexual activity: Never  Lifestyle   Physical activity    Days per week: Not on file    Minutes per session: Not on file   Stress: Not on file  Relationships   Social connections    Talks on phone: Not on file    Gets together: Not on file    Attends religious service: Not on file    Active member of club or organization: Not on file    Attends meetings of clubs or organizations: Not on file    Relationship status: Not on file   Intimate partner violence    Fear of current or ex partner: Not on file    Emotionally abused: Not on file    Physically abused: Not on file    Forced sexual activity: Not on file  Other Topics Concern   Not on file  Social History  Narrative   01-05-18 Unable to ask abuse questions family with him today.   11-012-19 Unable to ask abuse questions family with him today.      Review of Systems  All other systems reviewed and are negative.      Objective:   Physical Exam Constitutional:      Appearance: He is underweight.  HENT:     Right Ear: Decreased hearing noted.  Left Ear: Decreased hearing noted.     Nose: Nose normal. No congestion or rhinorrhea.  Cardiovascular:     Rate and Rhythm: Bradycardia present. Rhythm irregular.  Pulmonary:     Effort: Pulmonary effort is normal. No respiratory distress.     Breath sounds: No wheezing or rales.  Abdominal:     General: Bowel sounds are normal.     Palpations: Abdomen is soft.  Musculoskeletal:     Right lower leg: No edema.     Left lower leg: Edema present.     Left foot: Normal range of motion. Tenderness and bony tenderness present. No crepitus, deformity or laceration.  Neurological:     Mental Status: He is alert and oriented to person, place, and time.     Motor: Weakness present.     Gait: Gait abnormal.  Psychiatric:        Mood and Affect: Mood normal.        Behavior: Behavior normal.   In wheelchair        Assessment & Plan:   The encounter diagnosis was E. coli UTI. I believe the patient's recurrent urinary tract infections due to E. coli likely are coming from chronic prostatitis.  The other possibility would be asymptomatic colonization however he has had clear urine samples during this timeframe as well making me think that he is not simply colonized.  I will repeat a urine sample today and also send it for urine culture.  Using the results of this I will determine between a one-week course of Augmentin and a two-week course to cover possible prostatitis.  Start Myrbetriq for overactive bladder as I believe this is also playing a role in his frequency.  Also begin colchicine daily for presumed gout in his foot.

## 2018-10-31 ENCOUNTER — Telehealth: Payer: Self-pay | Admitting: Family Medicine

## 2018-10-31 DIAGNOSIS — C3431 Malignant neoplasm of lower lobe, right bronchus or lung: Secondary | ICD-10-CM | POA: Diagnosis not present

## 2018-10-31 DIAGNOSIS — I509 Heart failure, unspecified: Secondary | ICD-10-CM | POA: Diagnosis not present

## 2018-10-31 DIAGNOSIS — I2510 Atherosclerotic heart disease of native coronary artery without angina pectoris: Secondary | ICD-10-CM | POA: Diagnosis not present

## 2018-10-31 DIAGNOSIS — I13 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 failure and stage 1 through stage 4 chronic kidney disease, or unspecified chronic kidney disease: Secondary | ICD-10-CM | POA: Diagnosis not present

## 2018-10-31 DIAGNOSIS — I4891 Unspecified atrial fibrillation: Secondary | ICD-10-CM | POA: Diagnosis not present

## 2018-10-31 DIAGNOSIS — J449 Chronic obstructive pulmonary disease, unspecified: Secondary | ICD-10-CM | POA: Diagnosis not present

## 2018-10-31 DIAGNOSIS — I69218 Other symptoms and signs involving cognitive functions following other nontraumatic intracranial hemorrhage: Secondary | ICD-10-CM | POA: Diagnosis not present

## 2018-10-31 DIAGNOSIS — M1A0720 Idiopathic chronic gout, left ankle and foot, without tophus (tophi): Secondary | ICD-10-CM | POA: Diagnosis not present

## 2018-10-31 DIAGNOSIS — N183 Chronic kidney disease, stage 3 (moderate): Secondary | ICD-10-CM | POA: Diagnosis not present

## 2018-10-31 NOTE — Telephone Encounter (Signed)
Received a phone call from [place]. She informed me that patient is complaining of SOB . His oxygen saturations are within normal limits ranging from 96-99% however he is getting very winded per Anne Ng she states that he can not say more than 5 words without getting extremely SOB. He has a granddaughter that just recently tested positive for COVID. He not been around her as far as he knows since she has been sick however he has been around his son that is with positive Granddaughter on daily. Advised patient with his hx of CHF and possible COVID exposure to go to the ED immediately for SOB. Anne Ng stated that she will inform patient to go to the ED.

## 2018-11-01 LAB — URINE CULTURE
MICRO NUMBER:: 711189
Result:: NO GROWTH
SPECIMEN QUALITY:: ADEQUATE

## 2018-11-05 ENCOUNTER — Encounter (HOSPITAL_COMMUNITY): Payer: Self-pay | Admitting: Emergency Medicine

## 2018-11-05 ENCOUNTER — Emergency Department (HOSPITAL_COMMUNITY): Payer: Medicare HMO

## 2018-11-05 ENCOUNTER — Emergency Department (HOSPITAL_COMMUNITY)
Admission: EM | Admit: 2018-11-05 | Discharge: 2018-11-05 | Disposition: A | Discharge status: Home / Self Care | Payer: Medicare HMO | Attending: Emergency Medicine | Admitting: Emergency Medicine

## 2018-11-05 ENCOUNTER — Other Ambulatory Visit: Payer: Self-pay

## 2018-11-05 DIAGNOSIS — J90 Pleural effusion, not elsewhere classified: Secondary | ICD-10-CM | POA: Diagnosis not present

## 2018-11-05 DIAGNOSIS — I69218 Other symptoms and signs involving cognitive functions following other nontraumatic intracranial hemorrhage: Secondary | ICD-10-CM | POA: Diagnosis not present

## 2018-11-05 DIAGNOSIS — Z20828 Contact with and (suspected) exposure to other viral communicable diseases: Secondary | ICD-10-CM | POA: Insufficient documentation

## 2018-11-05 DIAGNOSIS — Z87891 Personal history of nicotine dependence: Secondary | ICD-10-CM | POA: Insufficient documentation

## 2018-11-05 DIAGNOSIS — R0602 Shortness of breath: Secondary | ICD-10-CM | POA: Diagnosis not present

## 2018-11-05 DIAGNOSIS — I259 Chronic ischemic heart disease, unspecified: Secondary | ICD-10-CM | POA: Diagnosis not present

## 2018-11-05 DIAGNOSIS — C3431 Malignant neoplasm of lower lobe, right bronchus or lung: Secondary | ICD-10-CM | POA: Diagnosis not present

## 2018-11-05 DIAGNOSIS — Z7982 Long term (current) use of aspirin: Secondary | ICD-10-CM | POA: Diagnosis not present

## 2018-11-05 DIAGNOSIS — I509 Heart failure, unspecified: Secondary | ICD-10-CM | POA: Diagnosis not present

## 2018-11-05 DIAGNOSIS — J449 Chronic obstructive pulmonary disease, unspecified: Secondary | ICD-10-CM | POA: Diagnosis not present

## 2018-11-05 DIAGNOSIS — Z85118 Personal history of other malignant neoplasm of bronchus and lung: Secondary | ICD-10-CM | POA: Diagnosis not present

## 2018-11-05 DIAGNOSIS — I2699 Other pulmonary embolism without acute cor pulmonale: Secondary | ICD-10-CM | POA: Insufficient documentation

## 2018-11-05 DIAGNOSIS — I13 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 failure and stage 1 through stage 4 chronic kidney disease, or unspecified chronic kidney disease: Secondary | ICD-10-CM | POA: Insufficient documentation

## 2018-11-05 DIAGNOSIS — M1A0720 Idiopathic chronic gout, left ankle and foot, without tophus (tophi): Secondary | ICD-10-CM | POA: Diagnosis not present

## 2018-11-05 DIAGNOSIS — I5030 Unspecified diastolic (congestive) heart failure: Secondary | ICD-10-CM | POA: Diagnosis not present

## 2018-11-05 DIAGNOSIS — I4891 Unspecified atrial fibrillation: Secondary | ICD-10-CM | POA: Diagnosis not present

## 2018-11-05 DIAGNOSIS — Z79899 Other long term (current) drug therapy: Secondary | ICD-10-CM | POA: Diagnosis not present

## 2018-11-05 DIAGNOSIS — R059 Cough, unspecified: Secondary | ICD-10-CM

## 2018-11-05 DIAGNOSIS — I2510 Atherosclerotic heart disease of native coronary artery without angina pectoris: Secondary | ICD-10-CM | POA: Diagnosis not present

## 2018-11-05 DIAGNOSIS — R05 Cough: Secondary | ICD-10-CM | POA: Diagnosis not present

## 2018-11-05 DIAGNOSIS — N183 Chronic kidney disease, stage 3 (moderate): Secondary | ICD-10-CM | POA: Insufficient documentation

## 2018-11-05 LAB — SARS CORONAVIRUS 2 (TAT 6-24 HRS): SARS Coronavirus 2: NEGATIVE

## 2018-11-05 MED ORDER — ALBUTEROL SULFATE HFA 108 (90 BASE) MCG/ACT IN AERS: 2.0000 | INHALATION_SPRAY | RESPIRATORY_TRACT | 0 refills | Status: DC | PRN

## 2018-11-05 MED ORDER — BENZONATATE 100 MG PO CAPS: 100.0000 mg | ORAL_CAPSULE | Freq: Three times a day (TID) | ORAL | 0 refills | Status: DC

## 2018-11-05 NOTE — Discharge Instructions (Signed)
Your coronavirus test should result later today Your chest x-ray showed no signs of infection, there was a very small amount of fluid on one side, this should go away by itself but please have a repeat x-ray in 2 weeks by your doctor You may take Tessalon every 8 hours as needed for coughing, albuterol every 4 hours as needed if you are feeling short of breath Please come back to the emergency department for severe or worsening symptoms.

## 2018-11-05 NOTE — ED Provider Notes (Signed)
[facility] EMERGENCY DEPARTMENT Provider Note   CSN: [id number] Arrival date & time: 11/05/18  1055     History   Chief Complaint Chief Complaint  Patient presents with  . Shortness of Breath  . Covid Exposure    HPI Evan [name] is a 85 y.o. male.     HPI  Has advanced HH care -  Last week they noticed that he was SOB - they called the PCP - who told him to come get a rapid Covid test - as a family member has had Covid - he has been coughing - mild and intermittent, no fevers, no vomiting, no diarrhea.  The patient had been admitted to the hospital in April after having a stroke and went to rehabilitation after that.  He is struggled with occasional urinary tract infections but more recently followed up with his family doctor.  They have treated his infections, placed him on Flomax and he is doing better.  I obtained most of this information from the medical record and the patient's daughter who I called on the phone to obtain additional history.  The patient's daughter lives with him and takes care of him 24 hours a day    Past Medical History:  Diagnosis Date  . Adenocarcinoma of right lung ([place]) 01/06/2016  . Anginal chest pain at rest [facility])    Chronic non, controlled on Lorazepam  . Anxiety   . Arthritis    "all over"   . BPH (benign prostatic hyperplasia)   . CAD (coronary artery disease)   . Chronic lower back pain   . Complication of anesthesia    "problems making water afterwards"  . Depression   . DJD (degenerative joint disease)   . Esophageal ulcer without bleeding    bid ppi indefinitely  . Family history of adverse reaction to anesthesia    "children w/PONV"  . GERD (gastroesophageal reflux disease)   . Headache    "couple/week maybe" (09/04/2015)  . Hemochromatosis    Possible, elevated iron stores, hook-like osteophytes on hand films, normal LFTs  . Hepatitis    "yellow jaundice as a baby"  . History of ASCVD    MULTIVESSEL   . Hyperlipidemia   . Hypertension   . Osteoarthritis   . Pneumonia    "several times; got a little now" (09/04/2015)  . Rheumatoid arthritis ([place])   . Subdural hematoma (H[facility])    small after fall 09/2016-plavix held, neurosurgery consulted.  Asymptomatic.    Marland Kitchen 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[place]) 02/2018   on Lovenox lifelong since he failed PO Eliquis    Patient Active Problem List   Diagnosis Date Noted  . Weakness generalized 09/28/2018  . Palliative care encounter 09/28/2018  . Pressure injury of skin 08/27/2018  . COPD (chronic obstructive pulmonary disease) ([place]) 08/26/2018  . AMS (altered mental status) 08/22/2018  . Altered mental status 08/21/2018  . UTI (urinary tract infection) 08/21/2018  . Abnormality of gait 08/20/2018  . Prolonged Q-T interval on ECG   . Dysphagia, post-stroke   . Supplemental oxygen dependent   . Anxiety state   . Fatigue   . SOB (shortness of breath)   . Hyperglycemia   . History of lung cancer   . Depression   . CKD (chronic kidney disease), stage III ([place])   . Acute blood loss anemia   . Fall 07/16/2018  . 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 Acute spontaneous intraparenchymal intracranial hemorrhage associated  with coagulopathy ([place]) L thalamic 07/14/2018  . Cellulitis of arm, right 06/01/2018  . Rash and nonspecific skin eruption 06/01/2018  . Chest pain 02/02/2018  . Thrombocytopenia ([place]) 02/02/2018  . Hypokalemia 02/02/2018  . Pulmonary embolism ([place]) 02/02/2018  . Elevated troponin 02/02/2018  . Pulmonary embolus ([place]) 02/02/2018  . Non-ST elevation MI (NSTEMI) ([place]) 06/27/2017  . Diastolic CHF ([place]) 74/03/8785  . Chest pain due to CAD ([place]) 06/26/2017  . Dyspnea 06/21/2017  . Acute bronchitis 05/15/2017  . Oral candidiasis 05/15/2017  . Acute pericarditis   . Chest pain at rest 12/20/2016  . Angina pectoris ([place]) 12/20/2016  . Radiation pneumonitis ([place]) 11/09/2016  . Pleurisy 10/11/2016  .  Chest wall pain 10/11/2016  . Atrial fibrillation ([place]) 05/21/2016  . Adenocarcinoma of right lung ([place]) 01/06/2016  . GAD (generalized anxiety disorder) 11/20/2015  . Essential hypertension   . Cellulitis 09/04/2015  . Cellulitis of left axilla   . Esophageal ulcer without bleeding   . Bilateral lower extremity edema 04/28/2014  . BPH (benign prostatic hyperplasia) 10/08/2012  . Anxiety   . EKG abnormalities 09/05/2012  . Tremor 09/05/2012  . Chronic fatigue 09/05/2012  . Arthritis   . Asthma, chronic   . Reflux esophagitis   . S/P CABG (coronary artery bypass graft)   . GERD (gastroesophageal reflux disease)   . Hyperlipidemia   . Hypertension   . UNSPECIFIED PERIPHERAL VASCULAR DISEASE 06/16/2009    Past Surgical History:  Procedure Laterality Date  . ARM SKIN LESION BIOPSY / EXCISION Left 09/02/2015  . CATARACT EXTRACTION W/ INTRAOCULAR LENS  IMPLANT, BILATERAL Bilateral   . CHOLECYSTECTOMY OPEN    . CORNEAL TRANSPLANT Bilateral    "one at [facility]; one at [facility]"  . CORONARY ANGIOPLASTY WITH STENT PLACEMENT    . CORONARY ARTERY BYPASS GRAFT  1999  . DESCENDING AORTIC ANEURYSM REPAIR W/ STENT     DE stent ostium into the right radial free graft at OM-, 02-2007  . ESOPHAGOGASTRODUODENOSCOPY N/A 11/09/2014   Procedure: ESOPHAGOGASTRODUODENOSCOPY (EGD);  Surgeon: [name], MD;  Location[facility] END[facility]COPY;  Service: Endoscopy;  Laterality: N/A;  . INGUINAL HERNIA REPAIR    . JOINT REPLACEMENT    . LEFT HEART CATH AND CORS/GRAFTS ANGIOGRAPHY N/A 06/27/2017   Procedure: LEFT HEART CATH AND CORS/GRAFTS ANGIOGRAPHY;  Surgeon: [name], MD;  Location: [facility] CV LAB;  Service: Cardiovascular;  Laterality: N/A;  . NASAL SINUS SURGERY    . SHOULDER OPEN ROTATOR CUFF REPAIR Right   . TOTAL KNEE ARTHROPLASTY Bilateral         Home Medications    Prior to Admission medications   Medication Sig Start Date End Date Taking? Authorizing Provider  acetaminophen (TYLENOL) 325 MG  tablet Take 1-2 tablets (325-650 mg total) by mouth every 4 (four) hours as needed for mild pain. 08/09/18   Love, Ivan Anchors, PA-C  albuterol (VENTOLIN HFA) 108 (90 Base) MCG/ACT inhaler Inhale 2 puffs into the lungs every 4 (four) hours as needed for wheezing or shortness of breath. 08/24/18   Aline August, MD  ALPRAZolam Duanne Moron) 0.5 MG tablet Take 0.5 tablets (0.25 mg total) by mouth 3 (three) times daily as needed for anxiety. 08/31/18   [name], MD  aspirin EC 81 MG EC tablet Take 1 tablet (81 mg total) by mouth daily. 08/14/18   Angiulli, Lavon Paganini, PA-C  camphor-menthol [facility]) lotion Apply 1 application topically daily as needed for itching. 08/24/18   Aline August, MD  colchicine  0.6 MG tablet Take 1 tablet (0.6 mg total) by mouth daily. 10/22/18   [name], MD  furosemide (LASIX) 20 MG tablet Take 1 tablet (20 mg total) by mouth daily. 08/14/18   Angiulli, Lavon Paganini, PA-C  Lifitegrast Shirley Friar) 5 % SOLN Place 1-2 drops into both eyes daily.     [provider]  magnesium oxide (MAG-OX) 400 (241.3 Mg) MG tablet Take 1 tablet (400 mg total) by mouth 2 (two) times daily. 08/14/18   Angiulli, Lavon Paganini, PA-C  metoprolol tartrate (LOPRESSOR) 25 MG tablet Take 0.5 tablets (12.5 mg total) by mouth 2 (two) times daily. 10/04/18   [name], MD  mirabegron ER (MYRBETRIQ) 25 MG TB24 tablet Take 1 tablet (25 mg total) by mouth daily. 10/16/18   [name], MD  nitroGLYCERIN (NITROSTAT) 0.4 MG SL tablet Place 1 tablet (0.4 mg total) under the tongue every 5 (five) minutes as needed for chest pain. 12/06/17   Daune Perch, NP  pantoprazole (PROTONIX) 40 MG tablet Take 1 tablet (40 mg total) by mouth every other day. 08/14/18   Angiulli, Lavon Paganini, PA-C  polyethylene glycol (MIRALAX / GLYCOLAX) 17 g packet Take 17 g by mouth daily as needed for mild constipation. 08/14/18   Angiulli, Lavon Paganini, PA-C  rosuvastatin (CRESTOR) 5 MG tablet Take 1 tablet (5 mg total) by mouth daily at 6  PM. 08/14/18   Angiulli, Lavon Paganini, PA-C  tamsulosin (FLOMAX) 0.4 MG CAPS capsule Take 1 capsule (0.4 mg total) by mouth daily. 08/14/18   Angiulli, Lavon Paganini, PA-C    Family History Family History  Problem Relation Age of Onset  . Arthritis-Osteo Sister   . Heart attack Brother   . Heart attack Other   . Cancer Neg Hx     Social History Social History   Tobacco Use  . Smoking status: Never Smoker  . Smokeless tobacco: Former Systems developer    Types: Chew  . Tobacco comment: "quit chewing in the 1960s"  Substance Use Topics  . Alcohol use: No  . Drug use: No     Allergies   Feldene [piroxicam], Statins, Doxycycline, Latex, Tequin, and Valium   Review of Systems Review of Systems  All other systems reviewed and are negative.    Physical Exam Updated Vital Signs BP 128/67   Pulse 62   Temp (!) 97.5 F (36.4 C) (Oral)   Resp 20   SpO2 98%   Physical Exam Vitals signs and nursing note reviewed.  Constitutional:      General: He is not in acute distress.    Appearance: He is well-developed.  HENT:     Head: Normocephalic and atraumatic.     Mouth/Throat:     Pharynx: No oropharyngeal exudate.  Eyes:     General: No scleral icterus.       Right eye: No discharge.        Left eye: No discharge.     Conjunctiva/sclera: Conjunctivae normal.     Pupils: Pupils are equal, round, and reactive to light.  Neck:     Musculoskeletal: Normal range of motion and neck supple.     Thyroid: No thyromegaly.     Vascular: No JVD.  Cardiovascular:     Rate and Rhythm: Normal rate and regular rhythm.     Heart sounds: Normal heart sounds. No murmur. No friction rub. No gallop.   Pulmonary:     Effort: Pulmonary effort is normal. No respiratory distress.  Breath sounds: Normal breath sounds. No wheezing or rales.  Abdominal:     General: Bowel sounds are normal. There is no distension.     Palpations: Abdomen is soft. There is no mass.     Tenderness: There is no abdominal  tenderness.  Musculoskeletal: Normal range of motion.        General: No tenderness.  Lymphadenopathy:     Cervical: No cervical adenopathy.  Skin:    General: Skin is warm and dry.     Findings: No erythema or rash.  Neurological:     Mental Status: He is alert.     Coordination: Coordination normal.  Psychiatric:        Behavior: Behavior normal.      ED Treatments / Results  Labs (all labs ordered are listed, but only abnormal results are displayed) Labs Reviewed  SARS CORONAVIRUS 2    EKG None  Radiology No results found.  Procedures Procedures (including critical care time)  Medications Ordered in ED Medications - No data to display   Initial Impression / Assessment and Plan / ED Course  I have reviewed the triage vital signs and the nursing notes.  Pertinent labs & imaging results that were available during my care of the patient were reviewed by me and considered in my medical decision making (see chart for details).  Clinical Course as of Nov 04 1217  Mon Nov 05, 2018  1216 The x-ray is unremarkable other than a very slight possible pleural effusion.  Otherwise the patient is afebrile, vital signs are unremarkable, he is not tachypneic on my exam.  Oxygen saturations are normal, COVID test will come back later, stable for discharge   [BM]    Clinical Course User Index [BM] Noemi Chapel, MD       The patient actually has an unremarkable exam, he is in a normal sinus rhythm with a controlled rate around 60, his lungs are clear without any increased work of breathing, he does cough occasionally.  I do not think he is at risk for pulmonary embolism given what I am seeing on his vital signs, I will obtain a chest x-ray to rule out pneumonia and a COVID swab to help with evaluating for coronavirus though at this time his oxygen level is normal and he will be able to be discharged.  I did discuss with the daughter the plan and she is in agreement.  Final  Clinical Impressions(s) / ED Diagnoses   Final diagnoses:  SOB (shortness of breath)  Cough    ED Discharge Orders         Ordered    benzonatate (TESSALON) 100 MG capsule  Every 8 hours     11/05/18 1218    albuterol (VENTOLIN HFA) 108 (90 Base) MCG/ACT inhaler  Every 4 hours PRN     11/05/18 1218           Noemi Chapel, MD 11/05/18 1219

## 2018-11-05 NOTE — Telephone Encounter (Signed)
Received a call from Evan [name] stating that the pt did not go to the ER as suggested on Friday 11/02/18 as pt refused to go. When she arrived today he was still having a lot of SOB. But now with added confusion. She stood him up twice and he was gasping for air. She then asked him about his SOB while standing and he could not remember standing up just 5 minutes ago. At that point the daughter was exteremly concerned and will take him to ER now regardless of his objections.

## 2018-11-05 NOTE — ED Triage Notes (Signed)
Pt here for [facility] and possible Covid exposure.  Pt states "the nurse told me to come and have  Chest XR and Covid test.  BIB daughter.  Pt poor historian and unclear of the exposure to Covid.

## 2018-11-07 ENCOUNTER — Telehealth: Payer: Self-pay | Admitting: Nurse Practitioner

## 2018-11-07 DIAGNOSIS — I2510 Atherosclerotic heart disease of native coronary artery without angina pectoris: Secondary | ICD-10-CM | POA: Diagnosis not present

## 2018-11-07 DIAGNOSIS — I509 Heart failure, unspecified: Secondary | ICD-10-CM | POA: Diagnosis not present

## 2018-11-07 DIAGNOSIS — M1A0720 Idiopathic chronic gout, left ankle and foot, without tophus (tophi): Secondary | ICD-10-CM | POA: Diagnosis not present

## 2018-11-07 DIAGNOSIS — I13 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 failure and stage 1 through stage 4 chronic kidney disease, or unspecified chronic kidney disease: Secondary | ICD-10-CM | POA: Diagnosis not present

## 2018-11-07 DIAGNOSIS — I69218 Other symptoms and signs involving cognitive functions following other nontraumatic intracranial hemorrhage: Secondary | ICD-10-CM | POA: Diagnosis not present

## 2018-11-07 DIAGNOSIS — I4891 Unspecified atrial fibrillation: Secondary | ICD-10-CM | POA: Diagnosis not present

## 2018-11-07 DIAGNOSIS — C3431 Malignant neoplasm of lower lobe, right bronchus or lung: Secondary | ICD-10-CM | POA: Diagnosis not present

## 2018-11-07 DIAGNOSIS — N183 Chronic kidney disease, stage 3 (moderate): Secondary | ICD-10-CM | POA: Diagnosis not present

## 2018-11-07 DIAGNOSIS — J449 Chronic obstructive pulmonary disease, unspecified: Secondary | ICD-10-CM | POA: Diagnosis not present

## 2018-11-07 NOTE — Telephone Encounter (Signed)
Called daughter's cell number to schedule a Palliative f/u visit, no answer and unable to leave message due to mailbox was full.  I then tried calling patient's home number with no answer and unable to leave message due to no voicemail set up.

## 2018-11-09 ENCOUNTER — Other Ambulatory Visit: Payer: Self-pay

## 2018-11-09 MED ORDER — TAMSULOSIN HCL 0.4 MG PO CAPS: 0.4000 mg | ORAL_CAPSULE | Freq: Every day | ORAL | 1 refills | Status: DC

## 2018-11-12 DIAGNOSIS — I13 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 failure and stage 1 through stage 4 chronic kidney disease, or unspecified chronic kidney disease: Secondary | ICD-10-CM | POA: Diagnosis not present

## 2018-11-12 DIAGNOSIS — I509 Heart failure, unspecified: Secondary | ICD-10-CM | POA: Diagnosis not present

## 2018-11-12 DIAGNOSIS — I4891 Unspecified atrial fibrillation: Secondary | ICD-10-CM | POA: Diagnosis not present

## 2018-11-12 DIAGNOSIS — M1A0720 Idiopathic chronic gout, left ankle and foot, without tophus (tophi): Secondary | ICD-10-CM | POA: Diagnosis not present

## 2018-11-12 DIAGNOSIS — J449 Chronic obstructive pulmonary disease, unspecified: Secondary | ICD-10-CM | POA: Diagnosis not present

## 2018-11-12 DIAGNOSIS — N183 Chronic kidney disease, stage 3 (moderate): Secondary | ICD-10-CM | POA: Diagnosis not present

## 2018-11-12 DIAGNOSIS — I2510 Atherosclerotic heart disease of native coronary artery without angina pectoris: Secondary | ICD-10-CM | POA: Diagnosis not present

## 2018-11-12 DIAGNOSIS — I69218 Other symptoms and signs involving cognitive functions following other nontraumatic intracranial hemorrhage: Secondary | ICD-10-CM | POA: Diagnosis not present

## 2018-11-12 DIAGNOSIS — C3431 Malignant neoplasm of lower lobe, right bronchus or lung: Secondary | ICD-10-CM | POA: Diagnosis not present

## 2018-11-14 DIAGNOSIS — I509 Heart failure, unspecified: Secondary | ICD-10-CM | POA: Diagnosis not present

## 2018-11-14 DIAGNOSIS — I69218 Other symptoms and signs involving cognitive functions following other nontraumatic intracranial hemorrhage: Secondary | ICD-10-CM | POA: Diagnosis not present

## 2018-11-14 DIAGNOSIS — M1A0720 Idiopathic chronic gout, left ankle and foot, without tophus (tophi): Secondary | ICD-10-CM | POA: Diagnosis not present

## 2018-11-14 DIAGNOSIS — I4891 Unspecified atrial fibrillation: Secondary | ICD-10-CM | POA: Diagnosis not present

## 2018-11-14 DIAGNOSIS — D689 Coagulation defect, unspecified: Secondary | ICD-10-CM | POA: Diagnosis not present

## 2018-11-14 DIAGNOSIS — J449 Chronic obstructive pulmonary disease, unspecified: Secondary | ICD-10-CM | POA: Diagnosis not present

## 2018-11-14 DIAGNOSIS — I2510 Atherosclerotic heart disease of native coronary artery without angina pectoris: Secondary | ICD-10-CM | POA: Diagnosis not present

## 2018-11-14 DIAGNOSIS — N183 Chronic kidney disease, stage 3 (moderate): Secondary | ICD-10-CM | POA: Diagnosis not present

## 2018-11-14 DIAGNOSIS — I13 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 failure and stage 1 through stage 4 chronic kidney disease, or unspecified chronic kidney disease: Secondary | ICD-10-CM | POA: Diagnosis not present

## 2018-11-14 DIAGNOSIS — C3431 Malignant neoplasm of lower lobe, right bronchus or lung: Secondary | ICD-10-CM | POA: Diagnosis not present

## 2018-11-14 DIAGNOSIS — I610 Nontraumatic intracerebral hemorrhage in hemisphere, subcortical: Secondary | ICD-10-CM | POA: Diagnosis not present

## 2018-11-14 DIAGNOSIS — I619 Nontraumatic intracerebral hemorrhage, unspecified: Secondary | ICD-10-CM | POA: Diagnosis not present

## 2018-11-15 DIAGNOSIS — I619 Nontraumatic intracerebral hemorrhage, unspecified: Secondary | ICD-10-CM | POA: Diagnosis not present

## 2018-11-15 DIAGNOSIS — I610 Nontraumatic intracerebral hemorrhage in hemisphere, subcortical: Secondary | ICD-10-CM | POA: Diagnosis not present

## 2018-11-15 DIAGNOSIS — D689 Coagulation defect, unspecified: Secondary | ICD-10-CM | POA: Diagnosis not present

## 2018-11-16 ENCOUNTER — Other Ambulatory Visit: Payer: Self-pay

## 2018-11-19 ENCOUNTER — Other Ambulatory Visit: Payer: Self-pay

## 2018-11-19 ENCOUNTER — Ambulatory Visit (INDEPENDENT_AMBULATORY_CARE_PROVIDER_SITE_OTHER): Payer: Medicare HMO | Admitting: Family Medicine

## 2018-11-19 ENCOUNTER — Encounter: Payer: Self-pay | Admitting: Family Medicine

## 2018-11-19 VITALS — BP 122/62 | HR 62 | Temp 97.8°F | Resp 16 | Ht 68.0 in

## 2018-11-19 DIAGNOSIS — I509 Heart failure, unspecified: Secondary | ICD-10-CM | POA: Diagnosis not present

## 2018-11-19 DIAGNOSIS — I69218 Other symptoms and signs involving cognitive functions following other nontraumatic intracranial hemorrhage: Secondary | ICD-10-CM | POA: Diagnosis not present

## 2018-11-19 DIAGNOSIS — H6123 Impacted cerumen, bilateral: Secondary | ICD-10-CM | POA: Diagnosis not present

## 2018-11-19 DIAGNOSIS — C3431 Malignant neoplasm of lower lobe, right bronchus or lung: Secondary | ICD-10-CM | POA: Diagnosis not present

## 2018-11-19 DIAGNOSIS — I13 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 failure and stage 1 through stage 4 chronic kidney disease, or unspecified chronic kidney disease: Secondary | ICD-10-CM | POA: Diagnosis not present

## 2018-11-19 DIAGNOSIS — I2510 Atherosclerotic heart disease of native coronary artery without angina pectoris: Secondary | ICD-10-CM | POA: Diagnosis not present

## 2018-11-19 DIAGNOSIS — J449 Chronic obstructive pulmonary disease, unspecified: Secondary | ICD-10-CM | POA: Diagnosis not present

## 2018-11-19 DIAGNOSIS — N183 Chronic kidney disease, stage 3 (moderate): Secondary | ICD-10-CM | POA: Diagnosis not present

## 2018-11-19 DIAGNOSIS — M1A0720 Idiopathic chronic gout, left ankle and foot, without tophus (tophi): Secondary | ICD-10-CM | POA: Diagnosis not present

## 2018-11-19 DIAGNOSIS — I4891 Unspecified atrial fibrillation: Secondary | ICD-10-CM | POA: Diagnosis not present

## 2018-11-19 MED ORDER — DEBROX 6.5 % OT SOLN: 5.0000 [drp] | Freq: Two times a day (BID) | OTIC | 0 refills | Status: DC

## 2018-11-19 NOTE — Progress Notes (Signed)
Subjective:    Patient ID: Evan [name], male    DOB: 03/04/1928, 85 y.o.   MRN: [id number]  HPI  Patient wears hearing aids bilaterally.  Recently was at the audiologist office and was informed that he had severe cerumen impactions affecting his hearing.  Daughter states that he cannot hear even with the hearing aids in place.  She also reports a high-pitched noise constantly coming from his hearing aids.  On examination today, the left auditory canal is almost completely filled with impacted cerumen.  The right canal also has impacted cerumen.  Patient is unable to hear me despite yelling loudly. Past Medical History:  Diagnosis Date   Adenocarcinoma of right lung ([place]) 01/06/2016   Anginal chest pain at rest [facility])    Chronic non, controlled on Lorazepam   Anxiety    Arthritis    "all over"    BPH (benign prostatic hyperplasia)    CAD (coronary artery disease)    Chronic lower back pain    Complication of anesthesia    "problems making water afterwards"   Depression    DJD (degenerative joint disease)    Esophageal ulcer without bleeding    bid ppi indefinitely   Family history of adverse reaction to anesthesia    "children w/PONV"   GERD (gastroesophageal reflux disease)    Headache    "couple/week maybe" (09/04/2015)   Hemochromatosis    Possible, elevated iron stores, hook-like osteophytes on hand films, normal LFTs   Hepatitis    "yellow jaundice as a baby"   History of ASCVD    MULTIVESSEL   Hyperlipidemia    Hypertension    Osteoarthritis    Pneumonia    "several times; got a little now" (09/04/2015)   Rheumatoid arthritis ([place])    Subdural hematoma ([place])    small after fall 09/2016-plavix held, neurosurgery consulted.  Asymptomatic.     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[place]) 02/2018   on Lovenox lifelong since he failed PO Eliquis   Past Surgical History:  Procedure Laterality Date   ARM SKIN LESION BIOPSY / EXCISION Left 09/02/2015   CATARACT EXTRACTION  W/ INTRAOCULAR LENS  IMPLANT, BILATERAL Bilateral    CHOLECYSTECTOMY OPEN     CORNEAL TRANSPLANT Bilateral    "one at [facility]; one at [facility]"   [place] W/ STENT     DE stent ostium into the right radial free graft at OM-, 02-2007   ESOPHAGOGASTRODUODENOSCOPY N/A 11/09/2014   Procedure: ESOPHAGOGASTRODUODENOSCOPY (EGD);  Surgeon: [name], MD;  Location: [facility] ENDOSCOPY;  Service: Endoscopy;  Laterality: N/A;   INGUINAL HERNIA REPAIR     JOINT REPLACEMENT     LEFT HEART CATH AND CORS/GRAFTS ANGIOGRAPHY N/A 06/27/2017   Procedure: LEFT HEART CATH AND CORS/GRAFTS ANGIOGRAPHY;  Surgeon: [name], MD;  Location: [facility] CV LAB;  Service: Cardiovascular;  Laterality: N/A;   NASAL SINUS SURGERY     SHOULDER OPEN ROTATOR CUFF REPAIR Right    TOTAL KNEE ARTHROPLASTY Bilateral    Current Outpatient Medications on File Prior to Visit  Medication Sig Dispense Refill   acetaminophen (TYLENOL) 325 MG tablet Take 1-2 tablets (325-650 mg total) by mouth every 4 (four) hours as needed for mild pain.     albuterol (VENTOLIN HFA) 108 (90 Base) MCG/ACT inhaler Inhale 2 puffs into the lungs every 4 (four) hours as needed for up to 14 days for  wheezing or shortness of breath. 18 g 0   ALPRAZolam (XANAX) 0.5 MG tablet Take 0.5 tablets (0.25 mg total) by mouth 3 (three) times daily as needed for anxiety. 30 tablet 0   aspirin EC 81 MG EC tablet Take 1 tablet (81 mg total) by mouth daily.     camphor-menthol (SARNA) lotion Apply 1 application topically daily as needed for itching.     colchicine 0.6 MG tablet Take 1 tablet (0.6 mg total) by mouth daily. 30 tablet 3   furosemide (LASIX) 20 MG tablet Take 1 tablet (20 mg total) by mouth daily. 30 tablet 0   Lifitegrast (XIIDRA) 5 % SOLN Place 1-2 drops into both eyes daily.      magnesium oxide (MAG-OX) 400 (241.3 Mg) MG tablet Take 1  tablet (400 mg total) by mouth 2 (two) times daily. 180 tablet 3   metoprolol tartrate (LOPRESSOR) 25 MG tablet Take 0.5 tablets (12.5 mg total) by mouth 2 (two) times daily. 90 tablet 3   mirabegron ER (MYRBETRIQ) 25 MG TB24 tablet Take 1 tablet (25 mg total) by mouth daily. 30 tablet 5   nitroGLYCERIN (NITROSTAT) 0.4 MG SL tablet Place 1 tablet (0.4 mg total) under the tongue every 5 (five) minutes as needed for chest pain. 25 tablet 2   pantoprazole (PROTONIX) 40 MG tablet Take 1 tablet (40 mg total) by mouth every other day. 30 tablet 0   polyethylene glycol (MIRALAX / GLYCOLAX) 17 g packet Take 17 g by mouth daily as needed for mild constipation. 14 each 0   rosuvastatin (CRESTOR) 5 MG tablet Take 1 tablet (5 mg total) by mouth daily at 6 PM. 30 tablet 0   tamsulosin (FLOMAX) 0.4 MG CAPS capsule Take 1 capsule (0.4 mg total) by mouth daily. 30 capsule 1   No current facility-administered medications on file prior to visit.    Allergies  Allergen Reactions   Feldene [Piroxicam] Other (See Comments)    REACTION: Blisters   Statins Other (See Comments)    Myalgias   Doxycycline Other (See Comments)    REACTION:  unknown   Latex Rash   Tequin Anxiety   Valium Other (See Comments)    REACTION:  unknown   Social History   Socioeconomic History   Marital status: Widowed    Spouse name: Not on file   Number of children: Not on file   Years of education: Not on file   Highest education level: Not on file  Occupational History   Not on file  Social Needs   Financial resource strain: Not on file   Food insecurity    Worry: Not on file    Inability: Not on file   Transportation needs    Medical: Not on file    Non-medical: Not on file  Tobacco Use   Smoking status: Never Smoker   Smokeless tobacco: Former Systems developer    Types: Chew   Tobacco comment: "quit chewing in the 1960s"  Substance and Sexual Activity   Alcohol use: No   Drug use: No   Sexual  activity: Never  Lifestyle   Physical activity    Days per week: Not on file    Minutes per session: Not on file   Stress: Not on file  Relationships   Social connections    Talks on phone: Not on file    Gets together: Not on file    Attends religious service: Not on file    Active member of club  or organization: Not on file    Attends meetings of clubs or organizations: Not on file    Relationship status: Not on file   Intimate partner violence    Fear of current or ex partner: Not on file    Emotionally abused: Not on file    Physically abused: Not on file    Forced sexual activity: Not on file  Other Topics Concern   Not on file  Social History Narrative   01-05-18 Unable to ask abuse questions family with him today.   11-012-19 Unable to ask abuse questions family with him today.     Review of Systems  All other systems reviewed and are negative.      Objective:   Physical Exam Vitals signs reviewed.  Constitutional:      Appearance: Normal appearance.  HENT:     Right Ear: There is impacted cerumen.     Left Ear: There is impacted cerumen.  Cardiovascular:     Rate and Rhythm: Normal rate and regular rhythm.  Pulmonary:     Effort: Pulmonary effort is normal.     Breath sounds: Normal breath sounds.  Neurological:     Mental Status: He is alert.           Assessment & Plan:  The encounter diagnosis was Bilateral impacted cerumen. Patient has bilateral cerumen impactions.  The left ear was removed easily with irrigation and lavage.  His hearing improved dramatically after removal of this wax.  However we were unsuccessful in removing the cerumen impaction from the right ear.  Therefore I recommended Debrox drops 5 drops twice a day for 5 days and then rechecking next week and see if we are more successful at that time after the wax is softened.  Currently is hurting the patient too much to attempt irrigation and lavage in the right ear.

## 2018-11-21 ENCOUNTER — Other Ambulatory Visit: Payer: Self-pay

## 2018-11-21 ENCOUNTER — Ambulatory Visit (HOSPITAL_COMMUNITY): Payer: Medicare HMO | Attending: Cardiovascular Disease

## 2018-11-21 DIAGNOSIS — I2699 Other pulmonary embolism without acute cor pulmonale: Secondary | ICD-10-CM | POA: Insufficient documentation

## 2018-11-21 MED ORDER — PERFLUTREN LIPID MICROSPHERE
1.0000 mL | INTRAVENOUS | Status: AC | PRN
  Administered 2018-11-21: 1 mL via INTRAVENOUS

## 2018-11-22 ENCOUNTER — Telehealth: Payer: Self-pay | Admitting: Nurse Practitioner

## 2018-11-22 NOTE — Telephone Encounter (Signed)
Spoke with daughter [name] and have scheduled a Telephone Palliative f/u visit for 12/11/18 @ 2 PM.

## 2018-11-23 DIAGNOSIS — I509 Heart failure, unspecified: Secondary | ICD-10-CM | POA: Diagnosis not present

## 2018-11-23 DIAGNOSIS — I4891 Unspecified atrial fibrillation: Secondary | ICD-10-CM | POA: Diagnosis not present

## 2018-11-23 DIAGNOSIS — N183 Chronic kidney disease, stage 3 (moderate): Secondary | ICD-10-CM | POA: Diagnosis not present

## 2018-11-23 DIAGNOSIS — I69218 Other symptoms and signs involving cognitive functions following other nontraumatic intracranial hemorrhage: Secondary | ICD-10-CM | POA: Diagnosis not present

## 2018-11-23 DIAGNOSIS — C3431 Malignant neoplasm of lower lobe, right bronchus or lung: Secondary | ICD-10-CM | POA: Diagnosis not present

## 2018-11-23 DIAGNOSIS — I2510 Atherosclerotic heart disease of native coronary artery without angina pectoris: Secondary | ICD-10-CM | POA: Diagnosis not present

## 2018-11-23 DIAGNOSIS — I13 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 failure and stage 1 through stage 4 chronic kidney disease, or unspecified chronic kidney disease: Secondary | ICD-10-CM | POA: Diagnosis not present

## 2018-11-23 DIAGNOSIS — J449 Chronic obstructive pulmonary disease, unspecified: Secondary | ICD-10-CM | POA: Diagnosis not present

## 2018-11-23 DIAGNOSIS — M1A0720 Idiopathic chronic gout, left ankle and foot, without tophus (tophi): Secondary | ICD-10-CM | POA: Diagnosis not present

## 2018-11-24 DIAGNOSIS — J449 Chronic obstructive pulmonary disease, unspecified: Secondary | ICD-10-CM | POA: Diagnosis not present

## 2018-11-24 DIAGNOSIS — Z85118 Personal history of other malignant neoplasm of bronchus and lung: Secondary | ICD-10-CM | POA: Diagnosis not present

## 2018-11-26 ENCOUNTER — Other Ambulatory Visit: Payer: Self-pay

## 2018-11-26 DIAGNOSIS — M1A0720 Idiopathic chronic gout, left ankle and foot, without tophus (tophi): Secondary | ICD-10-CM | POA: Diagnosis not present

## 2018-11-26 DIAGNOSIS — J449 Chronic obstructive pulmonary disease, unspecified: Secondary | ICD-10-CM | POA: Diagnosis not present

## 2018-11-26 DIAGNOSIS — N183 Chronic kidney disease, stage 3 (moderate): Secondary | ICD-10-CM | POA: Diagnosis not present

## 2018-11-26 DIAGNOSIS — I69218 Other symptoms and signs involving cognitive functions following other nontraumatic intracranial hemorrhage: Secondary | ICD-10-CM | POA: Diagnosis not present

## 2018-11-26 DIAGNOSIS — I4891 Unspecified atrial fibrillation: Secondary | ICD-10-CM | POA: Diagnosis not present

## 2018-11-26 DIAGNOSIS — I2510 Atherosclerotic heart disease of native coronary artery without angina pectoris: Secondary | ICD-10-CM | POA: Diagnosis not present

## 2018-11-26 DIAGNOSIS — I13 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 failure and stage 1 through stage 4 chronic kidney disease, or unspecified chronic kidney disease: Secondary | ICD-10-CM | POA: Diagnosis not present

## 2018-11-26 DIAGNOSIS — C3431 Malignant neoplasm of lower lobe, right bronchus or lung: Secondary | ICD-10-CM | POA: Diagnosis not present

## 2018-11-26 DIAGNOSIS — I509 Heart failure, unspecified: Secondary | ICD-10-CM | POA: Diagnosis not present

## 2018-11-27 ENCOUNTER — Ambulatory Visit (INDEPENDENT_AMBULATORY_CARE_PROVIDER_SITE_OTHER): Payer: Medicare HMO | Admitting: Family Medicine

## 2018-11-27 ENCOUNTER — Encounter: Payer: Self-pay | Admitting: Family Medicine

## 2018-11-27 VITALS — BP 100/62 | HR 96 | Temp 97.5°F | Resp 14 | Ht 68.0 in

## 2018-11-27 DIAGNOSIS — H6121 Impacted cerumen, right ear: Secondary | ICD-10-CM

## 2018-11-27 MED ORDER — NEOMYCIN-POLYMYXIN-HC 3.5-10000-1 OT SOLN: 4.0000 [drp] | Freq: Four times a day (QID) | OTIC | 0 refills | Status: DC

## 2018-11-27 MED ORDER — TAMSULOSIN HCL 0.4 MG PO CAPS: 0.8000 mg | ORAL_CAPSULE | Freq: Every day | ORAL | 3 refills | Status: DC

## 2018-11-27 NOTE — Progress Notes (Signed)
Subjective:    Patient ID: Evan [name], male    DOB: 02-20-1928, 85 y.o.   MRN: [id number]  HPI 11/19/18 Patient wears hearing aids bilaterally.  Recently was at the audiologist office and was informed that he had severe cerumen impactions affecting his hearing.  Daughter states that he cannot hear even with the hearing aids in place.  She also reports a high-pitched noise constantly coming from his hearing aids.  On examination today, the left auditory canal is almost completely filled with impacted cerumen.  The right canal also has impacted cerumen.  Patient is unable to hear me despite yelling loudly.  At that time, my plan was: Patient has bilateral cerumen impactions.  The left ear was removed easily with irrigation and lavage.  His hearing improved dramatically after removal of this wax.  However we were unsuccessful in removing the cerumen impaction from the right ear.  Therefore I recommended Debrox drops 5 drops twice a day for 5 days and then rechecking next week and see if we are more successful at that time after the wax is softened.  Currently is hurting the patient too much to attempt irrigation and lavage in the right ear.  11/27/18 Patient is here today to have the cerumen impaction removed from his right ear.  This was easily accomplished with lavage and irrigation after he is used Debrox over the last 5 days.  However he does have some pain in his ear.  Examination of his auditory canal shows a normal tympanic membrane however there is white exudate lining all the surfaces of the auditory canal.  There is some tenderness to palpation when at pull on his helix and his tragus.  There is some mild erythema in the auditory canal suggesting otitis externa. Past Medical History:  Diagnosis Date  . Adenocarcinoma of right lung ([place]) 01/06/2016  . Anginal chest pain at rest [facility])    Chronic non, controlled on Lorazepam  . Anxiety   . Arthritis    "all over"   . BPH (benign prostatic  hyperplasia)   . CAD (coronary artery disease)   . Chronic lower back pain   . Complication of anesthesia    "problems making water afterwards"  . Depression   . DJD (degenerative joint disease)   . Esophageal ulcer without bleeding    bid ppi indefinitely  . Family history of adverse reaction to anesthesia    "children w/PONV"  . GERD (gastroesophageal reflux disease)   . Headache    "couple/week maybe" (09/04/2015)  . Hemochromatosis    Possible, elevated iron stores, hook-like osteophytes on hand films, normal LFTs  . Hepatitis    "yellow jaundice as a baby"  . History of ASCVD    MULTIVESSEL  . Hyperlipidemia   . Hypertension   . Osteoarthritis   . Pneumonia    "several times; got a little now" (09/04/2015)  . Rheumatoid arthritis ([place])   . Subdural hematoma ([facility])    small after fall 09/2016-plavix held, neurosurgery consulted.  Asymptomatic.    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 ARM SKIN LESION BIOPSY / EXCISION Left 09/02/2015  . CATARACT EXTRACTION W/ INTRAOCULAR LENS  IMPLANT, BILATERAL Bilateral   . CHOLECYSTECTOMY OPEN    . CORNEAL TRANSPLANT Bilateral    "[facility] at [facility]; one at [facility]"  . CORONARY ANGIOPLASTY WITH STENT PLACEMENT    . CORONARY  ARTERY BYPASS GRAFT  1999  . DESCENDING AORTIC ANEURYSM REPAIR W/ STENT     DE stent ostium into the right radial free graft at OM-, 02-2007  . ESOPHAGOGASTRODUODENOSCOPY N/A 11/09/2014   Procedure: ESOPHAGOGASTRODUODENOSCOPY (EGD);  Surgeon: [name], MD;  Location: [facility] ENDOSCOPY;  Service: Endoscopy;  Laterality: N/A;  . INGUINAL HERNIA REPAIR    . JOINT REPLACEMENT    . LEFT HEART CATH AND CORS/GRAFTS ANGIOGRAPHY N/A 06/27/2017   Procedure: LEFT HEART CATH AND CORS/GRAFTS ANGIOGRAPHY;  Surgeon: [name], MD;  Location: [place] CV LAB;  Service: Cardiovascular;  Laterality: N/A;  . NASAL SINUS SURGERY    . SHOULDER  OPEN ROTATOR CUFF REPAIR Right   . TOTAL KNEE ARTHROPLASTY Bilateral    Current Outpatient Medications on File Prior to Visit  Medication Sig Dispense Refill  . acetaminophen (TYLENOL) 325 MG tablet Take 1-2 tablets (325-650 mg total) by mouth every 4 (four) hours as needed for mild pain.    Marland Kitchen ALPRAZolam (XANAX) 0.5 MG tablet Take 0.5 tablets (0.25 mg total) by mouth 3 (three) times daily as needed for anxiety. 30 tablet 0  . aspirin EC 81 MG EC tablet Take 1 tablet (81 mg total) by mouth daily.    . camphor-menthol (SARNA) lotion Apply 1 application topically daily as needed for itching.    . carbamide peroxide (DEBROX) 6.5 % OTIC solution Place 5 drops into the right ear 2 (two) times daily. 15 mL 0  . colchicine 0.6 MG tablet Take 1 tablet (0.6 mg total) by mouth daily. 30 tablet 3  . furosemide (LASIX) 20 MG tablet Take 1 tablet (20 mg total) by mouth daily. 30 tablet 0  . Lifitegrast (XIIDRA) 5 % SOLN Place 1-2 drops into both eyes daily.     . magnesium oxide (MAG-OX) 400 (241.3 Mg) MG tablet Take 1 tablet (400 mg total) by mouth 2 (two) times daily. 180 tablet 3  . metoprolol tartrate (LOPRESSOR) 25 MG tablet Take 0.5 tablets (12.5 mg total) by mouth 2 (two) times daily. 90 tablet 3  . mirabegron ER (MYRBETRIQ) 25 MG TB24 tablet Take 1 tablet (25 mg total) by mouth daily. 30 tablet 5  . nitroGLYCERIN (NITROSTAT) 0.4 MG SL tablet Place 1 tablet (0.4 mg total) under the tongue every 5 (five) minutes as needed for chest pain. 25 tablet 2  . pantoprazole (PROTONIX) 40 MG tablet Take 1 tablet (40 mg total) by mouth every other day. 30 tablet 0  . polyethylene glycol (MIRALAX / GLYCOLAX) 17 g packet Take 17 g by mouth daily as needed for mild constipation. 14 each 0  . rosuvastatin (CRESTOR) 5 MG tablet Take 1 tablet (5 mg total) by mouth daily at 6 PM. 30 tablet 0  . tamsulosin (FLOMAX) 0.4 MG CAPS capsule Take 1 capsule (0.4 mg total) by mouth daily. 30 capsule 1  . albuterol (VENTOLIN HFA)  108 (90 Base) MCG/ACT inhaler Inhale 2 puffs into the lungs every 4 (four) hours as needed for up to 14 days for wheezing or shortness of breath. 18 g 0   No current facility-administered medications on file prior to visit.    Allergies  Allergen Reactions  . Feldene [Piroxicam] Other (See Comments)    REACTION: Blisters  . Statins Other (See Comments)    Myalgias  . Doxycycline Other (See Comments)    REACTION:  unknown  . Latex Rash  . Tequin Anxiety  . Valium Other (See Comments)    REACTION:  unknown   Social History   Socioeconomic History  . Marital status: Widowed    Spouse name: Not on file  . Number of children: Not on file  . Years of education: Not on file  . Highest education level: Not on file  Occupational History  . Not on file  Social Needs  . Financial resource strain: Not on file  . Food insecurity    Worry: Not on file    Inability: Not on file  . Transportation needs    Medical: Not on file    Non-medical: Not on file  Tobacco Use  . Smoking status: Never Smoker  . Smokeless tobacco: Former Systems developer    Types: Chew  . Tobacco comment: "quit chewing in the 1960s"  Substance and Sexual Activity  . Alcohol use: No  . Drug use: No  . Sexual activity: Never  Lifestyle  . Physical activity    Days per week: Not on file    Minutes per session: Not on file  . Stress: Not on file  Relationships  . Social Herbalist on phone: Not on file    Gets together: Not on file    Attends religious service: Not on file    Active member of club or organization: Not on file    Attends meetings of clubs or organizations: Not on file    Relationship status: Not on file  . Intimate partner violence    Fear of current or ex partner: Not on file    Emotionally abused: Not on file    Physically abused: Not on file    Forced sexual activity: Not on file  Other Topics Concern  . Not on file  Social History Narrative   01-05-18 Unable to ask abuse questions  family with him today.   11-012-19 Unable to ask abuse questions family with him today.     Review of Systems  All other systems reviewed and are negative.      Objective:   Physical Exam Vitals signs reviewed.  Constitutional:      Appearance: Normal appearance.  HENT:     Right Ear: There is impacted cerumen.     Left Ear: There is no impacted cerumen.  Cardiovascular:     Rate and Rhythm: Normal rate and regular rhythm.  Pulmonary:     Effort: Pulmonary effort is normal.     Breath sounds: Normal breath sounds.  Neurological:     Mental Status: He is alert.           Assessment & Plan:  The encounter diagnosis was Hearing loss of right ear due to cerumen impaction. Cerumen impaction was removed with irrigation and lavage.  Patient does have otitis externa in the right auditory canal.  We will treat this with Cortisporin HC otic 4 drops 4 times daily in the right ear for the next 7 days.  Reassess if symptoms change or worsen in any way.

## 2018-11-28 DIAGNOSIS — N183 Chronic kidney disease, stage 3 (moderate): Secondary | ICD-10-CM | POA: Diagnosis not present

## 2018-11-28 DIAGNOSIS — I4891 Unspecified atrial fibrillation: Secondary | ICD-10-CM | POA: Diagnosis not present

## 2018-11-28 DIAGNOSIS — I2510 Atherosclerotic heart disease of native coronary artery without angina pectoris: Secondary | ICD-10-CM | POA: Diagnosis not present

## 2018-11-28 DIAGNOSIS — I509 Heart failure, unspecified: Secondary | ICD-10-CM | POA: Diagnosis not present

## 2018-11-28 DIAGNOSIS — C3431 Malignant neoplasm of lower lobe, right bronchus or lung: Secondary | ICD-10-CM | POA: Diagnosis not present

## 2018-11-28 DIAGNOSIS — I69218 Other symptoms and signs involving cognitive functions following other nontraumatic intracranial hemorrhage: Secondary | ICD-10-CM | POA: Diagnosis not present

## 2018-11-28 DIAGNOSIS — M1A0720 Idiopathic chronic gout, left ankle and foot, without tophus (tophi): Secondary | ICD-10-CM | POA: Diagnosis not present

## 2018-11-28 DIAGNOSIS — J449 Chronic obstructive pulmonary disease, unspecified: Secondary | ICD-10-CM | POA: Diagnosis not present

## 2018-11-28 DIAGNOSIS — I13 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 failure and stage 1 through stage 4 chronic kidney disease, or unspecified chronic kidney disease: Secondary | ICD-10-CM | POA: Diagnosis not present

## 2018-12-03 DIAGNOSIS — I4891 Unspecified atrial fibrillation: Secondary | ICD-10-CM | POA: Diagnosis not present

## 2018-12-03 DIAGNOSIS — C3431 Malignant neoplasm of lower lobe, right bronchus or lung: Secondary | ICD-10-CM | POA: Diagnosis not present

## 2018-12-03 DIAGNOSIS — M1A0720 Idiopathic chronic gout, left ankle and foot, without tophus (tophi): Secondary | ICD-10-CM | POA: Diagnosis not present

## 2018-12-03 DIAGNOSIS — J449 Chronic obstructive pulmonary disease, unspecified: Secondary | ICD-10-CM | POA: Diagnosis not present

## 2018-12-03 DIAGNOSIS — I69218 Other symptoms and signs involving cognitive functions following other nontraumatic intracranial hemorrhage: Secondary | ICD-10-CM | POA: Diagnosis not present

## 2018-12-03 DIAGNOSIS — I509 Heart failure, unspecified: Secondary | ICD-10-CM | POA: Diagnosis not present

## 2018-12-03 DIAGNOSIS — I2510 Atherosclerotic heart disease of native coronary artery without angina pectoris: Secondary | ICD-10-CM | POA: Diagnosis not present

## 2018-12-03 DIAGNOSIS — I13 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 failure and stage 1 through stage 4 chronic kidney disease, or unspecified chronic kidney disease: Secondary | ICD-10-CM | POA: Diagnosis not present

## 2018-12-03 DIAGNOSIS — N183 Chronic kidney disease, stage 3 (moderate): Secondary | ICD-10-CM | POA: Diagnosis not present

## 2018-12-04 ENCOUNTER — Telehealth: Payer: Self-pay | Admitting: *Deleted

## 2018-12-04 NOTE — Telephone Encounter (Signed)
CALLED PATIENT'S DAUGHTER [name] JO TO INFORM OF STAT LABS ON 12-07-18 @ 2:45 PM @ [facility] AND HIS CT ON 12-07-18- ARRIVAL TIME- 3:45 PM @ [facility], PT. TO HAVE WATER ONLY - 4 HRS. PRIOR TO TEST, ASHLYN BRUNING TO GIVE RESULTS ON 12-12-18 , SPOKE WITH PATIENT'S DAUGHTER [name] JO AND SHE IS AWARE OF THESE APPTS.

## 2018-12-05 DIAGNOSIS — I2510 Atherosclerotic heart disease of native coronary artery without angina pectoris: Secondary | ICD-10-CM | POA: Diagnosis not present

## 2018-12-05 DIAGNOSIS — I69218 Other symptoms and signs involving cognitive functions following other nontraumatic intracranial hemorrhage: Secondary | ICD-10-CM | POA: Diagnosis not present

## 2018-12-05 DIAGNOSIS — M1A0720 Idiopathic chronic gout, left ankle and foot, without tophus (tophi): Secondary | ICD-10-CM | POA: Diagnosis not present

## 2018-12-05 DIAGNOSIS — N183 Chronic kidney disease, stage 3 (moderate): Secondary | ICD-10-CM | POA: Diagnosis not present

## 2018-12-05 DIAGNOSIS — I13 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 failure and stage 1 through stage 4 chronic kidney disease, or unspecified chronic kidney disease: Secondary | ICD-10-CM | POA: Diagnosis not present

## 2018-12-05 DIAGNOSIS — C3431 Malignant neoplasm of lower lobe, right bronchus or lung: Secondary | ICD-10-CM | POA: Diagnosis not present

## 2018-12-05 DIAGNOSIS — J449 Chronic obstructive pulmonary disease, unspecified: Secondary | ICD-10-CM | POA: Diagnosis not present

## 2018-12-05 DIAGNOSIS — I509 Heart failure, unspecified: Secondary | ICD-10-CM | POA: Diagnosis not present

## 2018-12-05 DIAGNOSIS — I4891 Unspecified atrial fibrillation: Secondary | ICD-10-CM | POA: Diagnosis not present

## 2018-12-06 DIAGNOSIS — L299 Pruritus, unspecified: Secondary | ICD-10-CM | POA: Diagnosis not present

## 2018-12-06 DIAGNOSIS — L2084 Intrinsic (allergic) eczema: Secondary | ICD-10-CM | POA: Diagnosis not present

## 2018-12-06 DIAGNOSIS — L280 Lichen simplex chronicus: Secondary | ICD-10-CM | POA: Diagnosis not present

## 2018-12-06 DIAGNOSIS — L249 Irritant contact dermatitis, unspecified cause: Secondary | ICD-10-CM | POA: Diagnosis not present

## 2018-12-07 ENCOUNTER — Other Ambulatory Visit: Payer: Self-pay

## 2018-12-07 ENCOUNTER — Ambulatory Visit (HOSPITAL_COMMUNITY)
Admission: RE | Admit: 2018-12-07 | Discharge: 2018-12-07 | Disposition: A | Discharge status: Home / Self Care | Payer: Medicare HMO | Source: Ambulatory Visit | Attending: Urology | Admitting: Urology

## 2018-12-07 ENCOUNTER — Ambulatory Visit
Admission: RE | Admit: 2018-12-07 | Discharge: 2018-12-07 | Disposition: A | Discharge status: Home / Self Care | Payer: Medicare HMO | Source: Ambulatory Visit | Attending: Urology | Admitting: Urology

## 2018-12-07 DIAGNOSIS — C3491 Malignant neoplasm of unspecified part of right bronchus or lung: Secondary | ICD-10-CM | POA: Diagnosis not present

## 2018-12-07 DIAGNOSIS — Z85118 Personal history of other malignant neoplasm of bronchus and lung: Secondary | ICD-10-CM | POA: Diagnosis not present

## 2018-12-07 DIAGNOSIS — C3490 Malignant neoplasm of unspecified part of unspecified bronchus or lung: Secondary | ICD-10-CM | POA: Diagnosis not present

## 2018-12-07 LAB — BUN & CREATININE (CHCC)
BUN: 18 mg/dL (ref 8–23)
Creatinine: 1.21 mg/dL (ref 0.61–1.24)
GFR, Est AFR Am: >60 mL/min (ref >60)
GFR, Estimated: 53 mL/min — ABNORMAL LOW (ref >60)

## 2018-12-07 MED ORDER — SODIUM CHLORIDE (PF) 0.9 % IJ SOLN
INTRAMUSCULAR | Status: AC
  Filled 2018-12-07: qty 50

## 2018-12-07 MED ORDER — IOHEXOL 300 MG/ML  SOLN
75.0000 mL | Freq: Once | INTRAMUSCULAR | Status: AC | PRN
  Administered 2018-12-07: 75 mL via INTRAVENOUS

## 2018-12-11 ENCOUNTER — Encounter: Payer: Self-pay | Admitting: Nurse Practitioner

## 2018-12-11 ENCOUNTER — Other Ambulatory Visit: Payer: Self-pay

## 2018-12-11 ENCOUNTER — Other Ambulatory Visit: Payer: Medicare HMO | Admitting: Nurse Practitioner

## 2018-12-11 DIAGNOSIS — Z515 Encounter for palliative care: Secondary | ICD-10-CM | POA: Diagnosis not present

## 2018-12-11 NOTE — Progress Notes (Signed)
[facility] Consult Note Telephone: [phone]  Fax: [phone]  PATIENT NAME: Evan [name] DOB: Aug 13, 1927 MRN: [id number]  PRIMARY CARE PROVIDER:   [name], MD  REFERRING PROVIDER:  [name], MD [id number] [facility] [place],  [place] [address]  RESPONSIBLE PARTY:   Self; Evan [name] [phone] or [phone]  Due to the COVID-19 crisis, this visit was done via telemedicine from my office and it was initiated and consent by this patient and or family.  RECOMMENDATIONS and PLAN:  1. ACP: DNR; Will put in Epic/Vynca; Mail blank MOST form and Hard Choice book; revisit at scheduled next palliative care visit.  2. Generalized weaknessR53.1  secondary to right lung 01/2016, history of radiation pneumonitis, pleurisy, COPD, coronary artery disease s/p cabg, diastolic congestive heart failure, atrial fibrillation, myocardial infarction, pulmonary embolus, history of acute pericarditis, angina chest pain at rest chronic, artery disease, ascvd, descending aortic aneurysm repair with stent, peripheral vascular disease, pulmonary embolus, subdural hematoma  Encourage energy conservation and rest times, continue with therapy as able.  3. Palliative care encounter Z51.5; Palliative medicine team will continue to support patient, patient's family, and medical team. Visit consisted of counseling and education dealing with the complex and emotionally intense issues of symptom management and palliative care in the setting of serious and potentially life-threatening illness  I spent 45 minutes providing this consultation,  from 2:00pm to 2:45pm. More than 50% of the time in this consultation was spent coordinating communication.   HISTORY OF PRESENT ILLNESS:  Evan [name] is a 85 y.o. 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 I called Evan [name], Evan [name] daughter and unable to leave a message for schedule palliative care follow up visit. [name] return my call immediately and talked about purpose of palliative care follow-up visit. Evan [name] in agreement. We talked about how Evan [name] has been doing. [name] endorses he has been doing very well. He is been ambulating, does require bathing, assistance dressing. No recent Falls. No symptoms of pain or shortness of breath. Primary provider disordered physical therapy for strengthening and [name] endorses they are supposed to come do a visit this week to assess him. We talked about appetite which has been very good, he is gained some weight. [name] endorses she is very pleased with how he's been doing. We talked about medical goals of care, aggressive versus conservative vs Comfort Care. [name] wishes to treat what is treatable. He is a DNR. Asked if it was okay to complete a DNR [place] form, put in Epic/Vynca and mail copy to [name]. Evan [name] in agreement as she is not sure if she has a copy at home. We talked about the most form and Hard Choice book, [name] also  in agreement to have a blank MOST form mailed with  Hard Choice book for further discussion of goals of care. We talked about role of palliative care and plan of care. We talked about next schedule palliative care follow-up visit in 6 weeks if needed, sooner should [name] wish to complete the form or have questions. Appointment scheduled. Therapeutic listening and emotional support provided. Contact information provided. Questions answered satisfaction.  Palliative Care was asked to help to continue to address goals of care.   CODE STATUS: DNR  PPS: 40% HOSPICE ELIGIBILITY/DIAGNOSIS: TBD  PAST MEDICAL HISTORY:  Past Medical History:  Diagnosis Date  . Adenocarcinoma of right lung ([place]) 01/06/2016  . Anginal chest pain at rest [facility])    Chronic non, controlled on Lorazepam  . Anxiety   . Arthritis    "all over"   . BPH (benign prostatic hyperplasia)   . CAD (coronary artery disease)   . Chronic lower back pain   . Complication of anesthesia    "problems making water afterwards"  . Depression   . DJD (degenerative joint disease)   . Esophageal ulcer without bleeding    bid ppi indefinitely  . Family history of adverse reaction to anesthesia    "children w/PONV"  . GERD (gastroesophageal reflux disease)   . Headache    "couple/week maybe" (09/04/2015)  . Hemochromatosis    Possible, elevated iron stores, hook-like osteophytes on hand films, normal LFTs  . Hepatitis    "yellow jaundice as a baby"  . History of ASCVD    MULTIVESSEL  . Hyperlipidemia   . Hypertension   . Osteoarthritis   . Pneumonia    "several times; got a little now" (09/04/2015)  . Rheumatoid arthritis ([place])   . Subdural hematoma ([facility])    small after fall 09/2016-plavix held, neurosurgery consulted.  Asymptomatic.    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 Smoking status: Never Smoker  . Smokeless tobacco: Former Systems developer     Types: Chew  . Tobacco comment: "quit chewing in the 1960s"  Substance Use Topics  . Alcohol use: No    ALLERGIES:  Allergies  Allergen Reactions  . Feldene [Piroxicam] Other (See Comments)    REACTION: Blisters  . Statins Other (See Comments)    Myalgias  . Doxycycline Other (See Comments)    REACTION:  unknown  . Latex Rash  . Tequin Anxiety  . Valium Other (See Comments)    REACTION:  unknown     PERTINENT MEDICATIONS:  Outpatient Encounter Medications as of 12/11/2018  Medication Sig  . acetaminophen (TYLENOL) 325 MG tablet Take 1-2 tablets (325-650 mg total) by mouth every 4 (four) hours as needed for mild pain.  Marland Kitchen albuterol (VENTOLIN HFA) 108 (90 Base) MCG/ACT inhaler Inhale 2 puffs into the lungs every 4 (four) hours as needed for up to 14 days for wheezing or shortness of breath.  . ALPRAZolam (XANAX) 0.5 MG tablet Take 0.5 tablets (0.25 mg total) by mouth 3 (three) times daily as needed for anxiety.  Marland Kitchen aspirin EC 81 MG EC tablet Take 1 tablet (81 mg total) by mouth daily.  . camphor-menthol (SARNA) lotion Apply 1 application topically daily as needed for itching.  . carbamide peroxide (DEBROX) 6.5 % OTIC solution Place 5 drops into the right ear 2 (two) times daily.  . colchicine 0.6 MG tablet Take 1 tablet (0.6 mg total) by mouth daily.  . furosemide (LASIX) 20 MG  tablet Take 1 tablet (20 mg total) by mouth daily.  Marland Kitchen Lifitegrast (XIIDRA) 5 % SOLN Place 1-2 drops into both eyes daily.   . magnesium oxide (MAG-OX) 400 (241.3 Mg) MG tablet Take 1 tablet (400 mg total) by mouth 2 (two) times daily.  . metoprolol tartrate (LOPRESSOR) 25 MG tablet Take 0.5 tablets (12.5 mg total) by mouth 2 (two) times daily.  . mirabegron ER (MYRBETRIQ) 25 MG TB24 tablet Take 1 tablet (25 mg total) by mouth daily.  Marland Kitchen neomycin-polymyxin-hydrocortisone (CORTISPORIN) OTIC solution Place 4 drops into the right ear 4 (four) times daily.  . nitroGLYCERIN (NITROSTAT) 0.4 MG SL tablet Place 1 tablet  (0.4 mg total) under the tongue every 5 (five) minutes as needed for chest pain.  . pantoprazole (PROTONIX) 40 MG tablet Take 1 tablet (40 mg total) by mouth every other day.  . polyethylene glycol (MIRALAX / GLYCOLAX) 17 g packet Take 17 g by mouth daily as needed for mild constipation.  . rosuvastatin (CRESTOR) 5 MG tablet Take 1 tablet (5 mg total) by mouth daily at 6 PM.  . tamsulosin (FLOMAX) 0.4 MG CAPS capsule Take 2 capsules (0.8 mg total) by mouth daily after supper.   No facility-administered encounter medications on file as of 12/11/2018.     PHYSICAL EXAM:   Deferred  Thanos Cousineau Z Ahmere Hemenway, NP

## 2018-12-12 ENCOUNTER — Other Ambulatory Visit: Payer: Self-pay

## 2018-12-12 ENCOUNTER — Telehealth: Payer: Self-pay | Admitting: Radiation Oncology

## 2018-12-12 ENCOUNTER — Ambulatory Visit
Admission: RE | Admit: 2018-12-12 | Discharge: 2018-12-12 | Disposition: A | Discharge status: Home / Self Care | Payer: Medicare HMO | Source: Ambulatory Visit | Attending: Urology | Admitting: Urology

## 2018-12-12 DIAGNOSIS — Z08 Encounter for follow-up examination after completed treatment for malignant neoplasm: Secondary | ICD-10-CM | POA: Diagnosis not present

## 2018-12-12 DIAGNOSIS — C3491 Malignant neoplasm of unspecified part of right bronchus or lung: Secondary | ICD-10-CM

## 2018-12-12 DIAGNOSIS — C3431 Malignant neoplasm of lower lobe, right bronchus or lung: Secondary | ICD-10-CM | POA: Diagnosis not present

## 2018-12-12 NOTE — Progress Notes (Addendum)
Radiation Oncology         [phone] ________________________________  Name: Evan [name] MRN: [id number]  Date: 12/12/2018  DOB: June 23, 1927  Post Treatment Note  CC: [name], MD  [name], *  Diagnosis:   85 yo man with clinical stage I Adenocarcinoma of the right lower lung     Interval Since Last Radiation: 2 years, 4 months 02/15/2016 to 02/22/2016:  The RLL target was treated to 50 Gy in 5 fractions of 10 Gy  Narrative:  I spoke with the patient's daughter, Evan [name], with the patient on speaker phone, to conduct his routine scheduled 6 month follow up visit via telephone to review results from recent chest CT performed 12/07/18 to spare the patient unnecessary potential exposure in the healthcare setting during the current COVID-19 pandemic.  The patient was notified in advance and gave permission to proceed with this visit format.     In summary, he was initially seen at the request of Dr. Julien Nordmann on 01/25/16 for a new diagnosis of early stage lung cancer. The patient had been seen by Dr. Vaughan Browner in pulmonology on 12/03/15 complaining of chest pain and worsening shortness of breath. A CT scan of the chest on 12/15/2015 revealed interval enlargement of 2.7 x 1.6 x 2.0 cm right lower lobe mass highly suspicious for primary bronchogenic carcinoma. There was also borderline enlargement 0.9 cm high right paratracheal lymph node that was nonspecific. A PET scan was performed on 12/22/2015 and it showed predominantly solid 2.9 x 2.6 cm posterior right lower lobe pulmonary nodule with peripheral groundglass density mildly hypermetabolic with maximum SUV of 3.9 which increased from 1.3 x 1.1 cm. There was hypermetabolic bilateral hilar and mediastinal adenopathy highly nonspecific given the suspected underlying interstitial lung disease and could be reactive or metastatic. There was also nonspecific hypermetabolic right level II lymph nodes favoring a reactive node. On 12/24/2015  the patient underwent CT-guided core biopsy of the right lower lobe lung nodule by interventional radiology. The final pathology revealed adenocarcinoma.  MRI of the brain performed on 01/15/2016 showed no evidence for metastatic disease to the brain.  He elected to proceed with SBRT to the RLL mass for definitive treatment and received 5 fractions to a total dose of 50Gy between 02/15/2016 to 02/22/2016.  He tolerated treatment well and had no significant adverse effects. His initial post-treatment CT Chest demonstrated evolving postradiation changes as well as an interval increase in size of the treated RLL nodule and hilar nodes but were felt likely related to inflammatory changes associated with recent SBRT.  His subsequent follow up Chest CTs for disease surveillance have not shown evidence for disease progression.  He continues in follow up with Dr. Lake Bells in pulmonology for underlying COPD/emphysema.  His follow up post treatment CT scans have continued to demonstrate overall disease stability with treatment related changes/fibrosis but no evidence of disease recurrence or progression.  Unfortunately, in 02/2018, he suffered an acute PE in the lower lobe branch of right pulmonary artery and a DVT in left peroneal vein despite being on chronic Eliquis.  He was treated with 24hrs of IV heparin and discharged home on SQ Lovenox injections which he had continued until recent.  On the CT Angio Chest in 02/2018, there was also noted an interval increase in size of the right paratracheal lymph node, measuring 6mm as compared to 66mm on previous CT Chest 01/04/18 but this was resolved on follow up CT Chest 05/2018.  Interval History:  Fortunately, his recent scan shows disease stability without any specific findings identified to suggest progression of disease or metastasis.                      On review of systems, the patient states that he is doing well overall. He continues with chronic shortness of  breath which is exacerbated with exertion and lying flat as well as a chronic productive cough with whitish sputum.  Unfortunately, he has been in and out of the hospital on multiple occasions since we last saw him, most recent admission in 08/2018 for COPD exacerbation and UTI.  Prior admission in 07/2018 for a fall at home with [place] resulting in right hemiparesis and ataxia.  He currently denies hemoptysis, chest pain, fever, chills or night sweats. He reports a good appetite and has been maintaining his weight.  He denies headaches, changes in visual or auditory acuity, difficulty with speech, imbalance, tremor or seizure activity.   ALLERGIES:  is allergic to feldene [piroxicam]; statins; doxycycline; latex; tequin; and valium.  Meds: Current Outpatient Medications  Medication Sig Dispense Refill   acetaminophen (TYLENOL) 325 MG tablet Take 1-2 tablets (325-650 mg total) by mouth every 4 (four) hours as needed for mild pain.     albuterol (VENTOLIN HFA) 108 (90 Base) MCG/ACT inhaler Inhale 2 puffs into the lungs every 4 (four) hours as needed for up to 14 days for wheezing or shortness of breath. 18 g 0   ALPRAZolam (XANAX) 0.5 MG tablet Take 0.5 tablets (0.25 mg total) by mouth 3 (three) times daily as needed for anxiety. 30 tablet 0   aspirin EC 81 MG EC tablet Take 1 tablet (81 mg total) by mouth daily.     camphor-menthol (SARNA) lotion Apply 1 application topically daily as needed for itching.     carbamide peroxide (DEBROX) 6.5 % OTIC solution Place 5 drops into the right ear 2 (two) times daily. 15 mL 0   colchicine 0.6 MG tablet Take 1 tablet (0.6 mg total) by mouth daily. 30 tablet 3   furosemide (LASIX) 20 MG tablet Take 1 tablet (20 mg total) by mouth daily. 30 tablet 0   Lifitegrast (XIIDRA) 5 % SOLN Place 1-2 drops into both eyes daily.      magnesium oxide (MAG-OX) 400 (241.3 Mg) MG tablet Take 1 tablet (400 mg total) by mouth 2 (two) times daily. 180 tablet 3   metoprolol  tartrate (LOPRESSOR) 25 MG tablet Take 0.5 tablets (12.5 mg total) by mouth 2 (two) times daily. 90 tablet 3   mirabegron ER (MYRBETRIQ) 25 MG TB24 tablet Take 1 tablet (25 mg total) by mouth daily. 30 tablet 5   neomycin-polymyxin-hydrocortisone (CORTISPORIN) OTIC solution Place 4 drops into the right ear 4 (four) times daily. 10 mL 0   nitroGLYCERIN (NITROSTAT) 0.4 MG SL tablet Place 1 tablet (0.4 mg total) under the tongue every 5 (five) minutes as needed for chest pain. 25 tablet 2   pantoprazole (PROTONIX) 40 MG tablet Take 1 tablet (40 mg total) by mouth every other day. 30 tablet 0   polyethylene glycol (MIRALAX / GLYCOLAX) 17 g packet Take 17 g by mouth daily as needed for mild constipation. 14 each 0   rosuvastatin (CRESTOR) 5 MG tablet Take 1 tablet (5 mg total) by mouth daily at 6 PM. 30 tablet 0   tamsulosin (FLOMAX) 0.4 MG CAPS capsule Take 2 capsules (0.8 mg total) by mouth daily after supper. 180 capsule 3  No current facility-administered medications for this encounter.     Physical Findings:  vitals were not taken for this visit.   /10 Unable to assess due to telephone follow up visit format.  Lab Findings: Lab Results  Component Value Date   WBC 8.9 09/11/2018   HGB 11.3 (L) 09/11/2018   HCT 34.9 (L) 09/11/2018   MCV 94.6 09/11/2018   PLT 124 (L) 09/11/2018     Radiographic Findings: Ct Chest W Contrast  Result Date: 12/08/2018 CLINICAL DATA:  Non-small cell lung cancer.  Restaging. EXAM: CT CHEST WITH CONTRAST TECHNIQUE: Multidetector CT imaging of the chest was performed during intravenous contrast administration. CONTRAST:  4mL OMNIPAQUE IOHEXOL 300 MG/ML  SOLN COMPARISON:  CTA chest 08/26/2018 and CT chest 06/01/2018. FINDINGS: Cardiovascular: Mild cardiac enlargement. No pericardial effusion. Previous median sternotomy and CABG procedure. Aortic atherosclerosis. Mediastinum/Nodes: Normal appearance of the thyroid gland. The trachea appears patent and is  midline. Normal appearance of the esophagus. Stable 1 cm high right paratracheal lymph node, image 23/2. The index subcarinal lymph node measures 0.8 cm, image 69/2. Previously 1 cm. No hilar adenopathy. Lungs/Pleura: Subpleural masslike architectural distortion in the posteromedial right base in the area of previous tumor is again noted. Small right pleural effusion is new from previous exam. Overlying masslike architectural distortion in the area of previously noted tumor is similar to previous exam. No new pulmonary nodule or mass identified. Upper Abdomen: No acute abnormality. Musculoskeletal: The bones appear diffusely osteopenic. No aggressive lytic or sclerotic bone lesions identified. IMPRESSION: 1. No acute findings. Similar appearance of masslike architectural distortion in the right lung base in the area of previously treated tumor. Although underlying recurrent tumor would be difficult to exclude there are no specific findings identified to suggest progression of disease or metastasis. Electronically Signed   By: Kerby Moors M.D.   On: 12/08/2018 16:03    Impression/Plan: 22. 85 yo man with clinical stage I Adenocarcinoma of the right lower lung. We reviewed his most recent chest CT results which revealed overall disease stability with no new concerning nodules or evidence of disease progression. We will continue to monitor with a repeat chest CT in approximately 6 months. We will follow up via MyChart/WebEx visit thereafter to review results and any further recommendations. He will continue to follow-up with Dr. Lake Bells in pulmonology for his underlying interstitial lung disease contributing to his chronic shortness of breath and cough. Evan [name] reports that he has a scheduled follow up with Dr. Lake Bells in near future.  We will follow up with him via tele-health MyChart/WebEx after his next imaging to review those results and recommendations. He knows to call with any questions or concerns in  the interim.     Given current concerns for patient exposure during the COVID-19 pandemic, this encounter was conducted via telephone. The patient was notified in advance and was offered a [place] meeting to allow for face to face communication but unfortunately reported that he did not have the appropriate resources/technology to support such a visit and instead preferred to proceed with telephone follow up. The patient has given verbal consent for this type of encounter. The time spent during this encounter was 20 minutes. The attendants for this meeting include Jenniger Figiel PA-C, the patient's daughter, Evan [name] and patient, Evan [name]. During the encounter Priscila Bean PA-C, was located at [facility] Radiation Oncology Department.  Patient, Evan [name] and family, were located at home.    Nicholos Johns, PA-C

## 2018-12-12 NOTE — Telephone Encounter (Signed)
Received voicemail message from patient's daughter, [name]. Phoned [name] back promptly. She request to accompany her father to his follow up appointment with Freeman Caldron today to review his CT scan. Explained they should not come to [facility] but instead be available by phone at 1 today. Reinforced multiple times not to present for appointment today because Ashlyn Bruning will be calling them on the phone to review the results. [name] Found, daughter, request Ashlyn phone [phone] (billie's phone) to review results.

## 2018-12-13 DIAGNOSIS — I4891 Unspecified atrial fibrillation: Secondary | ICD-10-CM | POA: Diagnosis not present

## 2018-12-13 DIAGNOSIS — I2510 Atherosclerotic heart disease of native coronary artery without angina pectoris: Secondary | ICD-10-CM | POA: Diagnosis not present

## 2018-12-13 DIAGNOSIS — I69218 Other symptoms and signs involving cognitive functions following other nontraumatic intracranial hemorrhage: Secondary | ICD-10-CM | POA: Diagnosis not present

## 2018-12-13 DIAGNOSIS — I509 Heart failure, unspecified: Secondary | ICD-10-CM | POA: Diagnosis not present

## 2018-12-13 DIAGNOSIS — J449 Chronic obstructive pulmonary disease, unspecified: Secondary | ICD-10-CM | POA: Diagnosis not present

## 2018-12-13 DIAGNOSIS — N183 Chronic kidney disease, stage 3 (moderate): Secondary | ICD-10-CM | POA: Diagnosis not present

## 2018-12-13 DIAGNOSIS — C3431 Malignant neoplasm of lower lobe, right bronchus or lung: Secondary | ICD-10-CM | POA: Diagnosis not present

## 2018-12-13 DIAGNOSIS — M1A0720 Idiopathic chronic gout, left ankle and foot, without tophus (tophi): Secondary | ICD-10-CM | POA: Diagnosis not present

## 2018-12-13 DIAGNOSIS — I13 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 failure and stage 1 through stage 4 chronic kidney disease, or unspecified chronic kidney disease: Secondary | ICD-10-CM | POA: Diagnosis not present

## 2018-12-13 NOTE — Addendum Note (Signed)
Encounter addended by: Freeman Caldron, PA-C on: 12/13/2018 11:10 AM  Actions taken: Clinical Note Signed

## 2018-12-15 DIAGNOSIS — I619 Nontraumatic intracerebral hemorrhage, unspecified: Secondary | ICD-10-CM | POA: Diagnosis not present

## 2018-12-15 DIAGNOSIS — D689 Coagulation defect, unspecified: Secondary | ICD-10-CM | POA: Diagnosis not present

## 2018-12-15 DIAGNOSIS — I610 Nontraumatic intracerebral hemorrhage in hemisphere, subcortical: Secondary | ICD-10-CM | POA: Diagnosis not present

## 2018-12-16 DIAGNOSIS — D689 Coagulation defect, unspecified: Secondary | ICD-10-CM | POA: Diagnosis not present

## 2018-12-16 DIAGNOSIS — I610 Nontraumatic intracerebral hemorrhage in hemisphere, subcortical: Secondary | ICD-10-CM | POA: Diagnosis not present

## 2018-12-16 DIAGNOSIS — I619 Nontraumatic intracerebral hemorrhage, unspecified: Secondary | ICD-10-CM | POA: Diagnosis not present

## 2018-12-17 ENCOUNTER — Ambulatory Visit: Payer: Medicare HMO | Admitting: Physical Medicine & Rehabilitation

## 2018-12-17 DIAGNOSIS — I13 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 failure and stage 1 through stage 4 chronic kidney disease, or unspecified chronic kidney disease: Secondary | ICD-10-CM | POA: Diagnosis not present

## 2018-12-17 DIAGNOSIS — I25118 Atherosclerotic heart disease of native coronary artery with other forms of angina pectoris: Secondary | ICD-10-CM | POA: Diagnosis not present

## 2018-12-17 DIAGNOSIS — I69218 Other symptoms and signs involving cognitive functions following other nontraumatic intracranial hemorrhage: Secondary | ICD-10-CM | POA: Diagnosis not present

## 2018-12-17 DIAGNOSIS — R531 Weakness: Secondary | ICD-10-CM | POA: Diagnosis not present

## 2018-12-17 DIAGNOSIS — R2689 Other abnormalities of gait and mobility: Secondary | ICD-10-CM | POA: Diagnosis not present

## 2018-12-17 DIAGNOSIS — I69298 Other sequelae of other nontraumatic intracranial hemorrhage: Secondary | ICD-10-CM | POA: Diagnosis not present

## 2018-12-17 DIAGNOSIS — R270 Ataxia, unspecified: Secondary | ICD-10-CM | POA: Diagnosis not present

## 2018-12-17 DIAGNOSIS — I5030 Unspecified diastolic (congestive) heart failure: Secondary | ICD-10-CM | POA: Diagnosis not present

## 2018-12-17 DIAGNOSIS — I252 Old myocardial infarction: Secondary | ICD-10-CM | POA: Diagnosis not present

## 2018-12-18 ENCOUNTER — Other Ambulatory Visit: Payer: Self-pay

## 2018-12-18 ENCOUNTER — Ambulatory Visit (INDEPENDENT_AMBULATORY_CARE_PROVIDER_SITE_OTHER): Payer: Medicare HMO | Admitting: Adult Health

## 2018-12-18 ENCOUNTER — Encounter: Payer: Self-pay | Admitting: Adult Health

## 2018-12-18 VITALS — BP 123/78 | HR 56 | Temp 97.9°F | Ht 68.0 in | Wt 165.0 lb

## 2018-12-18 DIAGNOSIS — I10 Essential (primary) hypertension: Secondary | ICD-10-CM

## 2018-12-18 DIAGNOSIS — G8191 Hemiplegia, unspecified affecting right dominant side: Secondary | ICD-10-CM | POA: Diagnosis not present

## 2018-12-18 DIAGNOSIS — I610 Nontraumatic intracerebral hemorrhage in hemisphere, subcortical: Secondary | ICD-10-CM

## 2018-12-18 DIAGNOSIS — I480 Paroxysmal atrial fibrillation: Secondary | ICD-10-CM

## 2018-12-18 DIAGNOSIS — E785 Hyperlipidemia, unspecified: Secondary | ICD-10-CM | POA: Diagnosis not present

## 2018-12-18 NOTE — Patient Instructions (Signed)
Continue aspirin 81 mg daily  and Crestor  for secondary stroke prevention  Follow-up with cardiology in regards to potentially restarting Lovenox or different type of anticoagulation therapy for atrial fibrillation and stroke prevention  Continue to follow up with PCP regarding cholesterol and blood pressure management   Continue to work with therapy for ongoing improvement as well as following up with Dr. Posey Pronto tomorrow -may need to add occupational therapy  Continue to monitor blood pressure at home  Maintain strict control of hypertension with blood pressure goal below 130/90, diabetes with hemoglobin A1c goal below 6.5% and cholesterol with LDL cholesterol (bad cholesterol) goal below 70 mg/dL. I also advised the patient to eat a healthy diet with plenty of whole grains, cereals, fruits and vegetables, exercise regularly and maintain ideal body weight.  Followup in the future with me in 4 months or call earlier if needed       Thank you for coming to see [place] at [facility]. I hope we have been able to provide you high quality care today.  You may receive a patient satisfaction survey over the next few weeks. We would appreciate your feedback and comments so that we may continue to improve ourselves and the health of our patients.

## 2018-12-18 NOTE — Progress Notes (Signed)
[facility] [address] [place]. [place] [address] [phone]       OFFICE FOLLOW UP NOTE  [name] Date of Birth:  Feb 12, 1928 Medical Record Number:  [id number]   Reason for Referral:  hospital stroke follow up   CHIEF COMPLAINT:  Chief Complaint  Patient presents with  . Follow-up    3 mon f/u. Daughter present. Treatment room. Daughter would like to know will he ever go back on a blood thinner or just take the baby asa    HPI: Stroke admission 07/13/2018: Mr.Evan G Brownis a 85 y.o.malewith history of adenocarcinoma of the lung in remission, Htn, Hld, Hemochromatosis, CAD with stent, atrial fibrillation and PE on therapeutic Lovenox (failed eliquis), and hearing impairedfound on the floor by family with right hemiparesis. 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  Initial CT imaging showed acute intraparenchymal hemorrhage centered at the left thalamus therefore hedid not receive IV t-PA due to [place].  CTA head and neck showed atheromatous change about the carotid bifurcations with up to 50% narrowing on the right and 65% narrowing in the left, moderate arthrosclerosis bilateral siphon, L>R, and overall diminutive vertebrobasilar system.  Repeat CT head showed stable hemorrhage left thalamus without change.  2D echo unremarkable.  Discontinued Lovenox and no recommendation of antithrombotic given hemorrhage.  HTN stable.  HLD resume Crestor.  Other stroke risk factors include advanced age and CAD.  Prior history of fall with SDH in 2018.  Residual left hemiparesis and discharged to CIR for ongoing therapies.  Virtual visit 09/13/2018: He has been stable from a stroke standpoint with residual right hemiparesis with mild improvement. He continues to participate in home therapies. He in nonambulatory but able to stand with RW for about 20 seconds. Daughter reports  generalized lower extremity weakness but right > left. He unfortunately presented to ED on 08/26/2018 with complaints of shortness of breath and prior admission on 08/21/2018 for acute metabolic encephalopathy attributed to E. coli UTI and hypoxia.  Recommended discharge to SNF but family refused and referral to palliative care with possibility of transitioning to hospice. Currently awaiting initial palliative consult. Continues on aspirin with bleeding or bruising. Crestor for cholesterol without myalgais. Blood pressure stable typically around 100-110/60. Daughter does endorse right foot pain with redness on top of foot. No swelling or warmth noted. He did have recent ED admission on 09/11/18 with left foot pain and negative for DVT. Dx with possible gout. He does wear foot drop brace at night on his right foot. No further concerns at this time. Denies new or worsening stroke/TIA symptoms.   Update 12/18/2018: [name] is being seen today for stroke follow-up. Residual deficits of right sided weakness and coordination difficulties with daughter endorsing improvement.  He continues to work with therapy and has been able to ambulate with rolling walker short distances without great difficulty.  Denies any recent falls.  Uses wheelchair for long distance.  He continues on aspirin 81 mg without bleeding or bruising.  Daughter is questioning restarting of Lovenox or anticoagulant as this was discontinued due to [place] due to history of atrial fibrillation and PE.  No cardiology follow-up since discharge.  Continues on Crestor without myalgias.  Blood pressure today satisfactory 123/78.  No further concerns at this time.   ROS:   14 system review of systems performed and negative with exception of weakness and gait difficulty  PMH:  Past  Medical History:  Diagnosis Date  . Adenocarcinoma of right lung ([place]) 01/06/2016  . Anginal chest pain at rest [facility])    Chronic non, controlled on Lorazepam  . Anxiety   .  Arthritis    "all over"   . BPH (benign prostatic hyperplasia)   . CAD (coronary artery disease)   . Chronic lower back pain   . Complication of anesthesia    "problems making water afterwards"  . Depression   . DJD (degenerative joint disease)   . Esophageal ulcer without bleeding    bid ppi indefinitely  . Family history of adverse reaction to anesthesia    "children w/PONV"  . GERD (gastroesophageal reflux disease)   . Headache    "couple/week maybe" (09/04/2015)  . Hemochromatosis    Possible, elevated iron stores, hook-like osteophytes on hand films, normal LFTs  . Hepatitis    "yellow jaundice as a baby"  . History of ASCVD    MULTIVESSEL  . Hyperlipidemia   . Hypertension   . Osteoarthritis   . Pneumonia    "several times; got a little now" (09/04/2015)  . Rheumatoid arthritis ([place])   . Subdural hematoma ([facility])    small after fall 09/2016-plavix held, neurosurgery consulted.  Asymptomatic.    Marland Kitchen 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[place]) 02/2018   on Lovenox lifelong since he failed PO Eliquis    PSH:  Past Surgical History:  Procedure Laterality Date  . ARM SKIN LESION BIOPSY / EXCISION Left 09/02/2015  . CATARACT EXTRACTION W/ INTRAOCULAR LENS  IMPLANT, BILATERAL Bilateral   . CHOLECYSTECTOMY OPEN    . CORNEAL TRANSPLANT Bilateral    "one at [facility]; one at [facility]"  . CORONARY ANGIOPLASTY WITH STENT PLACEMENT    . CORONARY ARTERY BYPASS GRAFT  1999  . DESCENDING AORTIC ANEURYSM REPAIR W/ STENT     DE stent ostium into the right radial free graft at OM-, 02-2007  . ESOPHAGOGASTRODUODENOSCOPY N/A 11/09/2014   Procedure: ESOPHAGOGASTRODUODENOSCOPY (EGD);  Surgeon: [name], MD;  Location: [facility];  Service: Endoscopy;  Laterality: N/A;  . INGUINAL HERNIA REPAIR    . JOINT REPLACEMENT    . LEFT HEART CATH AND CORS/GRAFTS ANGIOGRAPHY N/A 06/27/2017   Procedure: LEFT HEART CATH AND CORS/GRAFTS ANGIOGRAPHY;  Surgeon: [name], MD;  Location: [facility];  Service: Cardiovascular;   Laterality: N/A;  . NASAL SINUS SURGERY    . SHOULDER OPEN ROTATOR CUFF REPAIR Right   . TOTAL KNEE ARTHROPLASTY Bilateral     Social History:  Social History   Socioeconomic History  . Marital status: Widowed    Spouse name: Not on file  . Number of children: Not on file  . Years of education: Not on file  . Highest education level: Not on file  Occupational History  . Not on file  Social Needs  . Financial resource strain: Not on file  . Food insecurity    Worry: Not on file    Inability: Not on file  . Transportation needs    Medical: Not on file    Non-medical: Not on file  Tobacco Use  . Smoking status: Never Smoker  . Smokeless tobacco: Former Systems developer    Types: Chew  . Tobacco comment: "quit chewing in the 1960s"  Substance and Sexual Activity  . Alcohol use: No  . Drug use: No  . Sexual activity: Never  Lifestyle  . Physical activity    Days per week: Not on file    Minutes per session: Not  on file  . Stress: Not on file  Relationships  . Social Herbalist on phone: Not on file    Gets together: Not on file    Attends religious service: Not on file    Active member of club or organization: Not on file    Attends meetings of clubs or organizations: Not on file    Relationship status: Not on file  . Intimate partner violence    Fear of current or ex partner: Not on file    Emotionally abused: Not on file    Physically abused: Not on file    Forced sexual activity: Not on file  Other Topics Concern  . Not on file  Social History Narrative   01-05-18 Unable to ask abuse questions family with him today.   11-012-19 Unable to ask abuse questions family with him today.    Family History:  Family History  Problem Relation Age of Onset  . Arthritis-Osteo Sister   . Heart attack Brother   . Heart attack Other   . Cancer Neg Hx     Medications:   Current Outpatient Medications on File Prior to Visit  Medication Sig Dispense Refill  .  acetaminophen (TYLENOL) 325 MG tablet Take 1-2 tablets (325-650 mg total) by mouth every 4 (four) hours as needed for mild pain.    Marland Kitchen ALPRAZolam (XANAX) 0.5 MG tablet Take 0.5 tablets (0.25 mg total) by mouth 3 (three) times daily as needed for anxiety. 30 tablet 0  . aspirin EC 81 MG EC tablet Take 1 tablet (81 mg total) by mouth daily.    . camphor-menthol (SARNA) lotion Apply 1 application topically daily as needed for itching.    . carbamide peroxide (DEBROX) 6.5 % OTIC solution Place 5 drops into the right ear 2 (two) times daily. 15 mL 0  . colchicine 0.6 MG tablet Take 1 tablet (0.6 mg total) by mouth daily. 30 tablet 3  . furosemide (LASIX) 20 MG tablet Take 1 tablet (20 mg total) by mouth daily. 30 tablet 0  . Lifitegrast (XIIDRA) 5 % SOLN Place 1-2 drops into both eyes daily.     . magnesium oxide (MAG-OX) 400 (241.3 Mg) MG tablet Take 1 tablet (400 mg total) by mouth 2 (two) times daily. 180 tablet 3  . metoprolol tartrate (LOPRESSOR) 25 MG tablet Take 0.5 tablets (12.5 mg total) by mouth 2 (two) times daily. 90 tablet 3  . mirabegron ER (MYRBETRIQ) 25 MG TB24 tablet Take 1 tablet (25 mg total) by mouth daily. 30 tablet 5  . neomycin-polymyxin-hydrocortisone (CORTISPORIN) OTIC solution Place 4 drops into the right ear 4 (four) times daily. 10 mL 0  . nitroGLYCERIN (NITROSTAT) 0.4 MG SL tablet Place 1 tablet (0.4 mg total) under the tongue every 5 (five) minutes as needed for chest pain. 25 tablet 2  . pantoprazole (PROTONIX) 40 MG tablet Take 1 tablet (40 mg total) by mouth every other day. 30 tablet 0  . polyethylene glycol (MIRALAX / GLYCOLAX) 17 g packet Take 17 g by mouth daily as needed for mild constipation. 14 each 0  . rosuvastatin (CRESTOR) 5 MG tablet Take 1 tablet (5 mg total) by mouth daily at 6 PM. 30 tablet 0  . tamsulosin (FLOMAX) 0.4 MG CAPS capsule Take 2 capsules (0.8 mg total) by mouth daily after supper. 180 capsule 3  . albuterol (VENTOLIN HFA) 108 (90 Base) MCG/ACT  inhaler Inhale 2 puffs into the lungs every 4 (four) hours  as needed for up to 14 days for wheezing or shortness of breath. 18 g 0   No current facility-administered medications on file prior to visit.     Allergies:   Allergies  Allergen Reactions  . Feldene [Piroxicam] Other (See Comments)    REACTION: Blisters  . Statins Other (See Comments)    Myalgias  . Doxycycline Other (See Comments)    REACTION:  unknown  . Latex Rash  . Tequin Anxiety  . Valium Other (See Comments)    REACTION:  unknown     Physical Exam  Today's Vitals   12/18/18 1530  BP: 123/78  Pulse: (!) 56  Temp: 97.9 F (36.6 C)  TempSrc: Oral  Weight: 165 lb (74.8 kg)  Height: 5\' 8"  (1.727 m)   Body mass index is 25.09 kg/m.  General: well developed, well nourished,  pleasant elderly Caucasian male, seated, in no evident distress Head: head normocephalic and atraumatic.   Neck: supple with no carotid or supraclavicular bruits Cardiovascular: regular rate and rhythm, no murmurs Musculoskeletal: no deformity Skin:  no rash/petichiae Vascular:  Normal pulses all extremities   Neurologic Exam Mental Status: Awake and fully alert. Oriented to place and time. Recent and remote memory intact. Attention span, concentration and fund of knowledge appropriate. Mood and affect appropriate.  Cranial Nerves: Fundoscopic exam reveals sharp disc margins. Pupils equal, briskly reactive to light. Extraocular movements full without nystagmus. Visual fields full to confrontation. Hearing intact. Facial sensation intact. Face, tongue, palate moves normally and symmetrically.  Motor: Right upper and lower extremity 4-4+/5 with moderate ataxia in upper and lower extremity.  Full strength left upper and lower extremity Sensory.:  Decreased light touch sensation right upper and lower extremity compared to left upper and lower extremity Coordination: Rapid alternating movements normal on left side. Finger-to-nose and  heel-to-shin performed accurately on left side but moderate ataxia with right upper and lower extremity. Gait and Station: Gait assessment deferred Reflexes: 1+ and symmetric. Toes downgoing.      SIGNIFICANT DIAGNOSTIC STUDIES Ct Head Code Stroke Wo Contrast 07/14/2018 IMPRESSION:  1.Acute intraparenchymal hemorrhage centered at the left thalamus, estimated volume 2.7 cc. Mild surrounding edema without significant regional mass effect.  2. Underlying age-related cerebral atrophy with moderate chronic small vessel ischemic disease.  CT CERVICAL SPINE IMPRESSION  1. No acute traumatic injury within the cervical spine.  2. Moderate cervical spondylolysis at C3-4 through C6-7, with resultant moderate to severe spinal stenosis at C4-5.   CT MAXILLOFACIAL  IMPRESSION  1. Soft tissue contusion with superimposed 3.4 cm hematoma at the right anterior face/neck.  2. No other acute maxillofacial injury. No fracture.  3. Chronic left maxillary sinusitis.   CTA HEAD AND NECK 07/14/2018 IMPRESSION  1. Negative CTA for large vessel occlusion. No acute vascular abnormality seen underlying the left thalamic hemorrhage. No aneurysm.  2.Atheromatous change about the carotid bifurcations with up to 50% narrowing on the right and 65% narrowing on the left.  3. Moderate diffuse carotid siphon atherosclerosis with moderate to advanced narrowing, left worse than right.  4. Fetal type origin of the PCAs with overall diminutive vertebrobasilar system.  5. Azygos ACA.  6.Mildly enlarged right paratracheal adenopathy, indeterminate, but could be reactive.   Ct Head Wo Contrast 07/15/2018 IMPRESSION: Stable hemorrhage left thalamus. No change from yesterday.  DG ABD 2 Views 07/15/2018 IMPRESSION: Nonobstructive bowel gas pattern. Nonspecific chronic reticular opacities at the lung bases.  EKG - SR rate 80 BPM. (See cardiology reading for complete  details)  Bilateral Lower Extremity  Venous Dopplers 07/14/2018 Summary: Right:There is no evidence of deep vein thrombosis in the lower extremity. However, portions of this examination were limited- see technologist comments above. Left:Findings consistent with chronic deep vein thrombosis involving the left peroneal vein.      ASSESSMENT: Evan [name] is a 85 y.o. 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 Vascular risk factors include HLD, HTN, s/p CABG, AF, thrombocytopenia, hx of PE and CAD.  Residual mild right hemiparesis and moderate ataxia    PLAN:  1. L ICH : Continue aspirin 81mg  and crestor 5mg   for secondary stroke prevention. Maintain strict control of hypertension with blood pressure goal below 130/90, diabetes with hemoglobin A1c goal below 6.5% and cholesterol with LDL cholesterol (bad cholesterol) goal below 70 mg/dL.  I also advised the patient to eat a healthy diet with plenty of whole grains, cereals, fruits and vegetables, exercise regularly with at least 30 minutes of continuous activity daily and maintain ideal body weight. 2. Residual deficits: Continue to work with physical therapy with potential need of initiating occupational therapy.  Advised physical medicine rehab will further determine indication/need for occupational therapy and if this will be beneficial 3. HTN: Advised to continue current treatment regimen.  Advised to continue to monitor at home along with continued follow-up with PCP for management 4. HLD: Advised to continue current treatment regimen along with continued follow-up with PCP for future prescribing and monitoring of lipid panel 5. Atrial fibrillation: In regards to initiating anticoagulation, will defer to cardiology.  No follow-up appointment since hospital admission and advised daughter that follow-up visit as needed.  She plans on calling office to schedule.  It would be recommended to restart some  form of anticoagulation for secondary stroke prevention as blood pressure has been stable and does not have history of falls   Follow up in 4 months or call earlier if needed   Greater than 50% of time during this 25 minute visit was spent on counseling, explanation of diagnosis of L ICH, reviewing risk factor management of atrial fibrillation, HTN and HLD, planning of further management along with potential future management, and discussion with patient and family answering all questions.   Frann Rider, AGNP-BC  [facility] [address] [place], [place] [address]  Phone [phone] Fax [phone] Note: This document was prepared with digital dictation and possible smart phrase technology. Any transcriptional errors that result from this process are unintentional.

## 2018-12-19 ENCOUNTER — Encounter: Payer: Self-pay | Admitting: Physical Medicine & Rehabilitation

## 2018-12-19 ENCOUNTER — Encounter: Payer: Medicare HMO | Attending: Physical Medicine & Rehabilitation | Admitting: Physical Medicine & Rehabilitation

## 2018-12-19 ENCOUNTER — Other Ambulatory Visit: Payer: Self-pay

## 2018-12-19 VITALS — BP 98/64 | HR 72 | Temp 97.7°F | Ht 65.0 in | Wt 165.0 lb

## 2018-12-19 DIAGNOSIS — M069 Rheumatoid arthritis, unspecified: Secondary | ICD-10-CM | POA: Insufficient documentation

## 2018-12-19 DIAGNOSIS — R2689 Other abnormalities of gait and mobility: Secondary | ICD-10-CM | POA: Diagnosis not present

## 2018-12-19 DIAGNOSIS — M25511 Pain in right shoulder: Secondary | ICD-10-CM

## 2018-12-19 DIAGNOSIS — M1990 Unspecified osteoarthritis, unspecified site: Secondary | ICD-10-CM | POA: Insufficient documentation

## 2018-12-19 DIAGNOSIS — G8929 Other chronic pain: Secondary | ICD-10-CM | POA: Insufficient documentation

## 2018-12-19 DIAGNOSIS — I2510 Atherosclerotic heart disease of native coronary artery without angina pectoris: Secondary | ICD-10-CM | POA: Insufficient documentation

## 2018-12-19 DIAGNOSIS — I610 Nontraumatic intracerebral hemorrhage in hemisphere, subcortical: Secondary | ICD-10-CM | POA: Diagnosis not present

## 2018-12-19 DIAGNOSIS — I25709 Atherosclerosis of coronary artery bypass graft(s), unspecified, with unspecified angina pectoris: Secondary | ICD-10-CM | POA: Insufficient documentation

## 2018-12-19 DIAGNOSIS — R350 Frequency of micturition: Secondary | ICD-10-CM

## 2018-12-19 DIAGNOSIS — R269 Unspecified abnormalities of gait and mobility: Secondary | ICD-10-CM | POA: Insufficient documentation

## 2018-12-19 DIAGNOSIS — Z8249 Family history of ischemic heart disease and other diseases of the circulatory system: Secondary | ICD-10-CM | POA: Insufficient documentation

## 2018-12-19 DIAGNOSIS — R600 Localized edema: Secondary | ICD-10-CM | POA: Diagnosis not present

## 2018-12-19 DIAGNOSIS — I5030 Unspecified diastolic (congestive) heart failure: Secondary | ICD-10-CM | POA: Diagnosis not present

## 2018-12-19 DIAGNOSIS — Z947 Corneal transplant status: Secondary | ICD-10-CM | POA: Insufficient documentation

## 2018-12-19 DIAGNOSIS — Z951 Presence of aortocoronary bypass graft: Secondary | ICD-10-CM | POA: Diagnosis not present

## 2018-12-19 DIAGNOSIS — Z96653 Presence of artificial knee joint, bilateral: Secondary | ICD-10-CM | POA: Diagnosis not present

## 2018-12-19 DIAGNOSIS — M545 Low back pain: Secondary | ICD-10-CM | POA: Insufficient documentation

## 2018-12-19 DIAGNOSIS — Z955 Presence of coronary angioplasty implant and graft: Secondary | ICD-10-CM | POA: Diagnosis not present

## 2018-12-19 DIAGNOSIS — I10 Essential (primary) hypertension: Secondary | ICD-10-CM | POA: Diagnosis not present

## 2018-12-19 DIAGNOSIS — Z9049 Acquired absence of other specified parts of digestive tract: Secondary | ICD-10-CM | POA: Diagnosis not present

## 2018-12-19 DIAGNOSIS — M79662 Pain in left lower leg: Secondary | ICD-10-CM | POA: Diagnosis not present

## 2018-12-19 DIAGNOSIS — Z8261 Family history of arthritis: Secondary | ICD-10-CM | POA: Insufficient documentation

## 2018-12-19 DIAGNOSIS — R1310 Dysphagia, unspecified: Secondary | ICD-10-CM | POA: Diagnosis not present

## 2018-12-19 DIAGNOSIS — I25118 Atherosclerotic heart disease of native coronary artery with other forms of angina pectoris: Secondary | ICD-10-CM | POA: Diagnosis not present

## 2018-12-19 DIAGNOSIS — F329 Major depressive disorder, single episode, unspecified: Secondary | ICD-10-CM | POA: Insufficient documentation

## 2018-12-19 DIAGNOSIS — F419 Anxiety disorder, unspecified: Secondary | ICD-10-CM | POA: Diagnosis not present

## 2018-12-19 DIAGNOSIS — L89609 Pressure ulcer of unspecified heel, unspecified stage: Secondary | ICD-10-CM | POA: Diagnosis not present

## 2018-12-19 DIAGNOSIS — Z85118 Personal history of other malignant neoplasm of bronchus and lung: Secondary | ICD-10-CM | POA: Insufficient documentation

## 2018-12-19 DIAGNOSIS — I69298 Other sequelae of other nontraumatic intracranial hemorrhage: Secondary | ICD-10-CM | POA: Diagnosis not present

## 2018-12-19 DIAGNOSIS — R531 Weakness: Secondary | ICD-10-CM | POA: Diagnosis not present

## 2018-12-19 DIAGNOSIS — J700 Acute pulmonary manifestations due to radiation: Secondary | ICD-10-CM | POA: Insufficient documentation

## 2018-12-19 DIAGNOSIS — R4189 Other symptoms and signs involving cognitive functions and awareness: Secondary | ICD-10-CM | POA: Insufficient documentation

## 2018-12-19 DIAGNOSIS — R270 Ataxia, unspecified: Secondary | ICD-10-CM | POA: Diagnosis not present

## 2018-12-19 DIAGNOSIS — I69218 Other symptoms and signs involving cognitive functions following other nontraumatic intracranial hemorrhage: Secondary | ICD-10-CM | POA: Diagnosis not present

## 2018-12-19 DIAGNOSIS — I13 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 failure and stage 1 through stage 4 chronic kidney disease, or unspecified chronic kidney disease: Secondary | ICD-10-CM | POA: Diagnosis not present

## 2018-12-19 DIAGNOSIS — I252 Old myocardial infarction: Secondary | ICD-10-CM | POA: Diagnosis not present

## 2018-12-19 MED ORDER — DICLOFENAC SODIUM 1 % TD GEL: 2.0000 g | Freq: Four times a day (QID) | TRANSDERMAL | 1 refills | Status: DC

## 2018-12-19 NOTE — Progress Notes (Signed)
I agree with the above plan 

## 2018-12-19 NOTE — Progress Notes (Signed)
Subjective:    Patient ID: Evan [name], male    DOB: 1928-02-08, 85 y.o.   MRN: [id number]  HPI 85 year old male with history of CAD, RA, RLL adenocarcinoma, possible radiation pneumonitis, A fib, DVT/PE--on treatment dose Lovenox, SDH 09/2016, recent admission for RUE hematoma/cellulitis, chronic thrombocytopenia presents for hospital follow-up after receiving CIR for left thalamic hemorrhage.  Last clinic visit on 09/19/2018.  Wife provides history. Since that time, patient presented to the ED with shortness of breath, unremarkable findings, notes reviewed.  He also saw neurology, notes reviewed. He continues to have therapies.  He was started on Prozaac by PCP, but family is not taking. He is not following up with Pulm anymore.  He is not requiring supplemental O2. Denies falls.  Heel ulcer has resolved. LE pain has resolved with meds for gout. No swallowing difficulties.   Pain Inventory Average Pain 3 Pain Right Now 2 My pain is aching  In the last 24 hours, has pain interfered with the following? General activity 3 Relation with others 3 Enjoyment of life 3 What TIME of day is your pain at its worst? morning Sleep (in general) Fair  Pain is worse with: walking and standing Pain improves with: therapy/exercise Relief from Meds: 0  Mobility use a wheelchair needs help with transfers  Function disabled: date disabled na I need assistance with the following:  dressing, bathing, toileting, meal prep, household duties and shopping  Neuro/Psych weakness numbness depression anxiety  Prior Studies Any changes since last visit?  no  Physicians involved in your care Any changes since last visit?  no Primary care Dr. Irish Lack Pulmonary-Dr. McQuaid   Family History  Problem Relation Age of Onset  . Arthritis-Osteo Sister   . Heart attack Brother   . Heart attack Other   . Cancer Neg Hx    Social History   Socioeconomic History  . Marital status: Widowed    Spouse  name: Not on file  . Number of children: Not on file  . Years of education: Not on file  . Highest education level: Not on file  Occupational History  . Not on file  Social Needs  . Financial resource strain: Not on file  . Food insecurity    Worry: Not on file    Inability: Not on file  . Transportation needs    Medical: Not on file    Non-medical: Not on file  Tobacco Use  . Smoking status: Never Smoker  . Smokeless tobacco: Former Systems developer    Types: Chew  . Tobacco comment: "quit chewing in the 1960s"  Substance and Sexual Activity  . Alcohol use: No  . Drug use: No  . Sexual activity: Never  Lifestyle  . Physical activity    Days per week: Not on file    Minutes per session: Not on file  . Stress: Not on file  Relationships  . Social Herbalist on phone: Not on file    Gets together: Not on file    Attends religious service: Not on file    Active member of club or organization: Not on file    Attends meetings of clubs or organizations: Not on file    Relationship status: Not on file  Other Topics Concern  . Not on file  Social History Narrative   01-05-18 Unable to ask abuse questions family with him today.   11-012-19 Unable to ask abuse questions family with him today.   Past  Surgical History:  Procedure Laterality Date  . ARM SKIN LESION BIOPSY / EXCISION Left 09/02/2015  . CATARACT EXTRACTION W/ INTRAOCULAR LENS  IMPLANT, BILATERAL Bilateral   . CHOLECYSTECTOMY OPEN    . CORNEAL TRANSPLANT Bilateral    "one at [facility]; one at [facility]"  . CORONARY ANGIOPLASTY WITH STENT PLACEMENT    . CORONARY ARTERY BYPASS GRAFT  1999  . DESCENDING AORTIC ANEURYSM REPAIR W/ STENT     DE stent ostium into the right radial free graft at OM-, 02-2007  . ESOPHAGOGASTRODUODENOSCOPY N/A 11/09/2014   Procedure: ESOPHAGOGASTRODUODENOSCOPY (EGD);  Surgeon: [name], MD;  Location: [facility] ENDOSCOPY;  Service: Endoscopy;  Laterality: N/A;  . INGUINAL HERNIA REPAIR    . JOINT  REPLACEMENT    . LEFT HEART CATH AND CORS/GRAFTS ANGIOGRAPHY N/A 06/27/2017   Procedure: LEFT HEART CATH AND CORS/GRAFTS ANGIOGRAPHY;  Surgeon: [name], MD;  Location: [facility];  Service: Cardiovascular;  Laterality: N/A;  . NASAL SINUS SURGERY    . SHOULDER OPEN ROTATOR CUFF REPAIR Right   . TOTAL KNEE ARTHROPLASTY Bilateral    Past Medical History:  Diagnosis Date  . Adenocarcinoma of right lung ([place]) 01/06/2016  . Anginal chest pain at rest [facility])    Chronic non, controlled on Lorazepam  . Anxiety   . Arthritis    "all over"   . BPH (benign prostatic hyperplasia)   . CAD (coronary artery disease)   . Chronic lower back pain   . Complication of anesthesia    "problems making water afterwards"  . Depression   . DJD (degenerative joint disease)   . Esophageal ulcer without bleeding    bid ppi indefinitely  . Family history of adverse reaction to anesthesia    "children w/PONV"  . GERD (gastroesophageal reflux disease)   . Headache    "couple/week maybe" (09/04/2015)  . Hemochromatosis    Possible, elevated iron stores, hook-like osteophytes on hand films, normal LFTs  . Hepatitis    "yellow jaundice as a baby"  . History of ASCVD    MULTIVESSEL  . Hyperlipidemia   . Hypertension   . Osteoarthritis   . Pneumonia    "several times; got a little now" (09/04/2015)  . Rheumatoid arthritis ([place])   . Subdural hematoma ([facility])    small after fall 09/2016-plavix held, neurosurgery consulted.  Asymptomatic.    Marland Kitchen 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[place]) 02/2018   on Lovenox lifelong since he failed PO Eliquis   BP 98/64   Pulse 72   Temp 97.7 F (36.5 C)   Ht 5\' 5"  (1.651 m) Comment: in wheelchair, pt reported  Wt 165 lb (74.8 kg) Comment: in wheelchair, pt reported  SpO2 97%   BMI 27.46 kg/m   Opioid Risk Score:   Fall Risk Score:  `1  Depression screen PHQ 2/9  Depression screen [facility] 2/9 08/20/2018 10/26/2017 08/23/2017 05/04/2017 04/21/2017  Decreased Interest 0 0 0 0 0  Down,  Depressed, Hopeless 2 0 0 0 0  PHQ - 2 Score 2 0 0 0 0  Altered sleeping 1 - - - -  Tired, decreased energy 2 - - - -  Change in appetite 0 - - - -  Feeling bad or failure about yourself  2 - - - -  Trouble concentrating 0 - - - -  Moving slowly or fidgety/restless 0 - - - -  Suicidal thoughts 1 - - - -  PHQ-9 Score 8 - - - -  Difficult doing work/chores Somewhat  difficult - - - -  Some recent data might be hidden    Review of Systems  Constitutional: Positive for chills and fever.  HENT: Negative.   Eyes: Negative.   Respiratory: Positive for cough and shortness of breath.   Cardiovascular: Negative.   Endocrine: Negative.   Genitourinary: Positive for difficulty urinating, dysuria and frequency.  Musculoskeletal: Positive for arthralgias, gait problem and myalgias.  Skin: Positive for rash.  Allergic/Immunologic: Negative.   Neurological: Positive for tremors, weakness and numbness.  Hematological: Negative.   Psychiatric/Behavioral: Positive for dysphoric mood. The patient is nervous/anxious.       Objective:   Physical Exam Constitutional: No distress . Vital signs reviewed. HENT: Normocephalic. Atraumatic.  Eyes: EOMI. No discharge. Cardiovascular: No JVD. Respiratory: Normal effort.   GI: Non-distended. Musc: ?TTP right shoulder Neurological:  He alert and oriented x2 Extreme HOH Motor:  RUE: 4/5 proximal to distal Right lower extremity: 4+/5 proximal to distal Skin: Warm and dry intact. Psychiatric: Normal mood.  Normal affect.    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  1. 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   Cont therapies  Cont follow up with Neuro  2. Depression  PCP given  prescription for Prozaac - elevated Qtc on higher dose, would avoid increasing above 20mg , encouraged  3. H/o Lung cancer/radiation pneumonitis:    No longer required supplemental O2  Cont nebs PRN   4. Gait abnormality  Cont wheelchair for safety   5. LE edema and pain  Resolved with gout meds  6. Urinary frequency  Management per PCP  Consider Urology referral if necessary  Main complaint per caregiver  7. Right shoulder pain  Voltaren gel for shoulder (has used in past for OA)

## 2018-12-21 ENCOUNTER — Telehealth: Payer: Self-pay | Admitting: Interventional Cardiology

## 2018-12-21 NOTE — Telephone Encounter (Signed)
New Message    Patient had a stroke and was taken off blood thinners and wants to know if he could be placed back on them now.

## 2018-12-21 NOTE — Telephone Encounter (Signed)
Spoke with pt's daughter and pt's Lovenox was stopped due to brain bleed and pt is no longer on any blood thinners only taking  ASA.Pt's daughter requesting appt to discuss options.Appt made for 01-04-19 at 2:40 PM .Will forward to Dr Irish Lack for review .Adonis Housekeeper

## 2018-12-21 NOTE — Telephone Encounter (Signed)
This would be ok to make a virtual visit since we are mostly discussing anticoagulation.

## 2018-12-24 ENCOUNTER — Emergency Department (HOSPITAL_COMMUNITY): Payer: Medicare HMO

## 2018-12-24 ENCOUNTER — Emergency Department (HOSPITAL_BASED_OUTPATIENT_CLINIC_OR_DEPARTMENT_OTHER): Payer: Medicare HMO

## 2018-12-24 ENCOUNTER — Emergency Department (HOSPITAL_COMMUNITY)
Admission: EM | Admit: 2018-12-24 | Discharge: 2018-12-24 | Disposition: A | Discharge status: Home / Self Care | Payer: Medicare HMO | Attending: Emergency Medicine | Admitting: Emergency Medicine

## 2018-12-24 ENCOUNTER — Other Ambulatory Visit: Payer: Self-pay

## 2018-12-24 ENCOUNTER — Encounter (HOSPITAL_COMMUNITY): Payer: Self-pay | Admitting: Emergency Medicine

## 2018-12-24 DIAGNOSIS — R270 Ataxia, unspecified: Secondary | ICD-10-CM | POA: Diagnosis not present

## 2018-12-24 DIAGNOSIS — N183 Chronic kidney disease, stage 3 (moderate): Secondary | ICD-10-CM | POA: Insufficient documentation

## 2018-12-24 DIAGNOSIS — I25118 Atherosclerotic heart disease of native coronary artery with other forms of angina pectoris: Secondary | ICD-10-CM | POA: Diagnosis not present

## 2018-12-24 DIAGNOSIS — R531 Weakness: Secondary | ICD-10-CM | POA: Diagnosis not present

## 2018-12-24 DIAGNOSIS — I129 Hypertensive chronic kidney disease with stage 1 through stage 4 chronic kidney disease, or unspecified chronic kidney disease: Secondary | ICD-10-CM | POA: Diagnosis not present

## 2018-12-24 DIAGNOSIS — J45909 Unspecified asthma, uncomplicated: Secondary | ICD-10-CM | POA: Insufficient documentation

## 2018-12-24 DIAGNOSIS — I13 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 failure and stage 1 through stage 4 chronic kidney disease, or unspecified chronic kidney disease: Secondary | ICD-10-CM | POA: Diagnosis not present

## 2018-12-24 DIAGNOSIS — M79604 Pain in right leg: Secondary | ICD-10-CM

## 2018-12-24 DIAGNOSIS — M79651 Pain in right thigh: Secondary | ICD-10-CM | POA: Diagnosis not present

## 2018-12-24 DIAGNOSIS — Z951 Presence of aortocoronary bypass graft: Secondary | ICD-10-CM | POA: Insufficient documentation

## 2018-12-24 DIAGNOSIS — Z86718 Personal history of other venous thrombosis and embolism: Secondary | ICD-10-CM | POA: Diagnosis not present

## 2018-12-24 DIAGNOSIS — Z79899 Other long term (current) drug therapy: Secondary | ICD-10-CM | POA: Insufficient documentation

## 2018-12-24 DIAGNOSIS — Z91040 Latex allergy status: Secondary | ICD-10-CM | POA: Insufficient documentation

## 2018-12-24 DIAGNOSIS — I69218 Other symptoms and signs involving cognitive functions following other nontraumatic intracranial hemorrhage: Secondary | ICD-10-CM | POA: Diagnosis not present

## 2018-12-24 DIAGNOSIS — I252 Old myocardial infarction: Secondary | ICD-10-CM | POA: Diagnosis not present

## 2018-12-24 DIAGNOSIS — M7989 Other specified soft tissue disorders: Secondary | ICD-10-CM | POA: Diagnosis not present

## 2018-12-24 DIAGNOSIS — I5030 Unspecified diastolic (congestive) heart failure: Secondary | ICD-10-CM | POA: Diagnosis not present

## 2018-12-24 DIAGNOSIS — M79661 Pain in right lower leg: Secondary | ICD-10-CM | POA: Diagnosis not present

## 2018-12-24 DIAGNOSIS — I69298 Other sequelae of other nontraumatic intracranial hemorrhage: Secondary | ICD-10-CM | POA: Diagnosis not present

## 2018-12-24 DIAGNOSIS — I2510 Atherosclerotic heart disease of native coronary artery without angina pectoris: Secondary | ICD-10-CM | POA: Diagnosis not present

## 2018-12-24 DIAGNOSIS — R2689 Other abnormalities of gait and mobility: Secondary | ICD-10-CM | POA: Diagnosis not present

## 2018-12-24 DIAGNOSIS — M79609 Pain in unspecified limb: Secondary | ICD-10-CM | POA: Diagnosis not present

## 2018-12-24 LAB — CBC WITH DIFFERENTIAL/PLATELET
Abs Immature Granulocytes: 0.06 10*3/uL (ref 0.00–0.07)
Basophils Absolute: 0 10*3/uL (ref 0.0–0.1)
Basophils Relative: 0 %
Eosinophils Absolute: 0.3 10*3/uL (ref 0.0–0.5)
Eosinophils Relative: 3 %
HCT: 40.5 % (ref 39.0–52.0)
Hemoglobin: 13.4 g/dL (ref 13.0–17.0)
Immature Granulocytes: 1 %
Lymphocytes Relative: 20 %
Lymphs Abs: 1.9 10*3/uL (ref 0.7–4.0)
MCH: 30.8 pg (ref 26.0–34.0)
MCHC: 33.1 g/dL (ref 30.0–36.0)
MCV: 93.1 fL (ref 80.0–100.0)
Monocytes Absolute: 1.1 10*3/uL — ABNORMAL HIGH (ref 0.1–1.0)
Monocytes Relative: 12 %
Neutro Abs: 5.9 10*3/uL (ref 1.7–7.7)
Neutrophils Relative %: 64 %
Platelets: 154 10*3/uL (ref 150–400)
RBC: 4.35 MIL/uL (ref 4.22–5.81)
RDW: 14.8 % (ref 11.5–15.5)
WBC: 9.2 10*3/uL (ref 4.0–10.5)
nRBC: 0 % (ref 0.0–0.2)

## 2018-12-24 LAB — COMPREHENSIVE METABOLIC PANEL
ALT: 18 U/L (ref 0–44)
AST: 20 U/L (ref 15–41)
Albumin: 3.7 g/dL (ref 3.5–5.0)
Alkaline Phosphatase: 126 U/L (ref 38–126)
Anion gap: 9 (ref 5–15)
BUN: 28 mg/dL — ABNORMAL HIGH (ref 8–23)
CO2: 27 mmol/L (ref 22–32)
Calcium: 9.3 mg/dL (ref 8.9–10.3)
Chloride: 103 mmol/L (ref 98–111)
Creatinine, Ser: 1.45 mg/dL — ABNORMAL HIGH (ref 0.61–1.24)
GFR calc Af Amer: 49 mL/min — ABNORMAL LOW (ref >60)
GFR calc non Af Amer: 42 mL/min — ABNORMAL LOW (ref >60)
Glucose, Bld: 95 mg/dL (ref 70–99)
Potassium: 4.1 mmol/L (ref 3.5–5.1)
Sodium: 139 mmol/L (ref 135–145)
Total Bilirubin: 1.4 mg/dL — ABNORMAL HIGH (ref 0.3–1.2)
Total Protein: 7.1 g/dL (ref 6.5–8.1)

## 2018-12-24 NOTE — ED Notes (Signed)
Pt verbalized understanding  Of discharge and follow up instructions . Pt placed in wheelchair and taken to lobby where ride was waiting. Assisted pt into care safely.

## 2018-12-24 NOTE — Telephone Encounter (Signed)
Called and spoke to daughter and made her aware that Dr. Irish Lack can do a virtual visit this Wednesday to discuss anticoagulation options instead of coming in to the office in October. Daughter is in agreement. Appointment made and consent obtained.     Virtual Visit Pre-Appointment Phone Call  TELEPHONE CALL NOTE  ALLEX LAPOINT has been deemed a candidate for a follow-up tele-health visit to limit community exposure during the Covid-19 pandemic. I spoke with the daughter via phone to ensure availability of phone/video source, confirm preferred email & phone number, and discuss instructions and expectations.  I reminded daughter to be prepared with any vital sign and/or heart rhythm information that could potentially be obtained via home monitoring, at the time of his visit. I reminded daughter to expect a phone call prior to his visit.  Daughter agrees to consent below.  Cleon Gustin, RN 12/24/2018 9:44 AM   FULL LENGTH CONSENT FOR TELE-HEALTH VISIT   I hereby voluntarily request, consent and authorize [facility] and its employed or contracted physicians, physician assistants, nurse practitioners or other licensed health care professionals (the Practitioner), to provide me with telemedicine health care services (the "Services") as deemed necessary by the treating Practitioner. I acknowledge and consent to receive the Services by the Practitioner via telemedicine. I understand that the telemedicine visit will involve communicating with the Practitioner through live audiovisual communication technology and the disclosure of certain medical information by electronic transmission. I acknowledge that I have been given the opportunity to request an in-person assessment or other available alternative prior to the telemedicine visit and am voluntarily participating in the telemedicine visit.  I understand that I have the right to withhold or withdraw my consent to the use of telemedicine in  the course of my care at any time, without affecting my right to future care or treatment, and that the Practitioner or I may terminate the telemedicine visit at any time. I understand that I have the right to inspect all information obtained and/or recorded in the course of the telemedicine visit and may receive copies of available information for a reasonable fee.  I understand that some of the potential risks of receiving the Services via telemedicine include:  Marland Kitchen Delay or interruption in medical evaluation due to technological equipment failure or disruption; . Information transmitted may not be sufficient (e.g. poor resolution of images) to allow for appropriate medical decision making by the Practitioner; and/or  . In rare instances, security protocols could fail, causing a breach of personal health information.  Furthermore, I acknowledge that it is my responsibility to provide information about my medical history, conditions and care that is complete and accurate to the best of my ability. I acknowledge that Practitioner's advice, recommendations, and/or decision may be based on factors not within their control, such as incomplete or inaccurate data provided by me or distortions of diagnostic images or specimens that may result from electronic transmissions. I understand that the practice of medicine is not an exact science and that Practitioner makes no warranties or guarantees regarding treatment outcomes. I acknowledge that I will receive a copy of this consent concurrently upon execution via email to the email address I last provided but may also request a printed copy by calling the office of [place].    I understand that my insurance will be billed for this visit.   I have read or had this consent read to me. . I understand the contents of this consent, which adequately explains  the benefits and risks of the Services being provided via telemedicine.  . I have been provided ample  opportunity to ask questions regarding this consent and the Services and have had my questions answered to my satisfaction. . I give my informed consent for the services to be provided through the use of telemedicine in my medical care  By participating in this telemedicine visit I agree to the above.

## 2018-12-24 NOTE — Progress Notes (Signed)
Right lower extremity venous duplex has been completed. Preliminary results can be found in CV Proc through chart review.  Results were given to [facility].  12/24/18 3:18 PM Carlos Levering RVT

## 2018-12-24 NOTE — ED Triage Notes (Addendum)
Pt here with R leg pain since yesterday. He reports it starts in his R hip and radiates down his R leg. Pt has hx of stroke earlier this year with partial paralysis to R side extremities. Pt denies CP, SHOB, or new weakness to extremities. Pt has hx of blood clots, no swelling noted to RLE.

## 2018-12-24 NOTE — ED Provider Notes (Signed)
[facility] EMERGENCY DEPARTMENT Provider Note   CSN: [id number] Arrival date & time: 12/24/18  1127     History   Chief Complaint Chief Complaint  Patient presents with  . Leg Pain    HPI Evan [name] is a 85 y.o. male.     The history is provided by the patient. No language interpreter was used.  Leg Pain Location:  Leg Time since incident:  2 days Injury: no   Leg location:  R leg Pain details:    Quality:  Aching   Radiates to:  Does not radiate   Severity:  Moderate   Onset quality:  Gradual   Timing:  Constant   Progression:  Worsening Chronicity:  New Dislocation: no   Foreign body present:  No foreign bodies Prior injury to area:  No Relieved by:  Nothing Worsened by:  Bearing weight Ineffective treatments:  None tried Risk factors: concern for non-accidental trauma   Pt reports he has pain in his right leg.  Pt has chronic weakness and numbness of right leg.  Pt reports history of dvt in leg Pt has not had any fall but he has had some transfers to wheel chair that were uncomfortable.  Pt has had a dvt in the past  Past Medical History:  Diagnosis Date  . Adenocarcinoma of right lung ([place]) 01/06/2016  . Anginal chest pain at rest [facility])    Chronic non, controlled on Lorazepam  . Anxiety   . Arthritis    "all over"   . BPH (benign prostatic hyperplasia)   . CAD (coronary artery disease)   . Chronic lower back pain   . Complication of anesthesia    "problems making water afterwards"  . Depression   . DJD (degenerative joint disease)   . Esophageal ulcer without bleeding    bid ppi indefinitely  . Family history of adverse reaction to anesthesia    "children w/PONV"  . GERD (gastroesophageal reflux disease)   . Headache    "couple/week maybe" (09/04/2015)  . Hemochromatosis    Possible, elevated iron stores, hook-like osteophytes on hand films, normal LFTs  . Hepatitis    "yellow jaundice as a baby"  . History of ASCVD    MULTIVESSEL  . Hyperlipidemia   . Hypertension   . Osteoarthritis   . Pneumonia    "several times; got a little now" (09/04/2015)  . Rheumatoid arthritis ([place])   . Subdural hematoma (H[facility])    small after fall 09/2016-plavix held, neurosurgery consulted.  Asymptomatic.    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 Chronic right shoulder pain 12/19/2018  . Weakness generalized 09/28/2018  . Palliative care encounter 09/28/2018  . Pressure injury of skin 08/27/2018  . COPD (chronic obstructive pulmonary disease) ([place]) 08/26/2018  . AMS (altered mental status) 08/22/2018  . Altered mental status 08/21/2018  . UTI (urinary tract infection) 08/21/2018  . Abnormality of gait 08/20/2018  . Prolonged Q-T interval on ECG   . Dysphagia, post-stroke   . Supplemental oxygen dependent   . Anxiety state   . Fatigue   . SOB (shortness of breath)   . Hyperglycemia   . History of lung cancer   . Depression   . CKD (chronic kidney disease), stage III ([place])   . Acute blood loss anemia   . Fall 07/16/2018  . 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 Acute spontaneous 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 ([place]) L thalamic 07/14/2018  . Cellulitis of arm, right 06/01/2018  . Rash and nonspecific skin eruption 06/01/2018  . Chest pain 02/02/2018  . Thrombocytopenia ([place]) 02/02/2018  . Hypokalemia 02/02/2018  . Pulmonary embolism ([place]) 02/02/2018  . Elevated troponin 02/02/2018  . Pulmonary embolus ([place]) 02/02/2018  . Non-ST elevation MI (NSTEMI) ([place]) 06/27/2017  . Diastolic CHF ([place]) 42/35/3614  . Chest pain due to CAD ([place]) 06/26/2017  . Dyspnea 06/21/2017  . Acute bronchitis 05/15/2017  . Oral candidiasis 05/15/2017  . Acute pericarditis   . Chest pain at rest 12/20/2016  . Angina pectoris ([place]) 12/20/2016  . 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 Pleurisy 10/11/2016  . Chest wall pain 10/11/2016  . Atrial fibrillation ([place]) 05/21/2016  . Adenocarcinoma of right lung ([place]) 01/06/2016  . GAD (generalized anxiety disorder) 11/20/2015  . Essential hypertension   . Cellulitis 09/04/2015  . Cellulitis of left axilla   . Esophageal ulcer without bleeding   . Bilateral lower extremity edema 04/28/2014  . BPH (benign prostatic hyperplasia) 10/08/2012  . Anxiety   . EKG abnormalities 09/05/2012  . Tremor 09/05/2012  . Chronic fatigue 09/05/2012  . Arthritis   . Asthma, chronic   . Reflux esophagitis   . S/P CABG (coronary artery bypass graft)   . GERD (gastroesophageal reflux disease)   . Hyperlipidemia   . Hypertension   . UNSPECIFIED PERIPHERAL VASCULAR DISEASE 06/16/2009    Past Surgical History:  Procedure Laterality Date  . ARM SKIN LESION BIOPSY / EXCISION Left 09/02/2015  . CATARACT EXTRACTION W/ INTRAOCULAR LENS  IMPLANT, BILATERAL Bilateral   . CHOLECYSTECTOMY OPEN    . CORNEAL TRANSPLANT Bilateral    "one at [facility]; one at [facility]"  . CORONARY ANGIOPLASTY WITH STENT PLACEMENT    . CORONARY ARTERY BYPASS GRAFT  1999  . DESCENDING AORTIC ANEURYSM REPAIR W/ STENT     DE stent ostium into the right radial free graft at OM-, 02-2007  . ESOPHAGOGASTRODUODENOSCOPY N/A 11/09/2014   Procedure: ESOPHAGOGASTRODUODENOSCOPY (EGD);  Surgeon: [name], MD;  Location: [facility] ENDOSCOPY;  Service: Endoscopy;  Laterality: N/A;  . INGUINAL HERNIA REPAIR    . JOINT REPLACEMENT    . LEFT HEART CATH AND CORS/GRAFTS ANGIOGRAPHY N/A 06/27/2017   Procedure: LEFT HEART CATH AND CORS/GRAFTS ANGIOGRAPHY;  Surgeon: [name], MD;  Location: [facility] CV LAB;  Service: Cardiovascular;  Laterality: N/A;  . NASAL SINUS SURGERY    . SHOULDER OPEN ROTATOR CUFF REPAIR Right   . TOTAL KNEE ARTHROPLASTY Bilateral         Home Medications    Prior to Admission medications   Medication Sig Start Date End Date  Taking? Authorizing Provider  acetaminophen (TYLENOL) 325 MG tablet Take 1-2 tablets (325-650 mg total) by mouth every 4 (four) hours as needed for mild pain. 08/09/18   Love, Ivan Anchors, PA-C  albuterol (VENTOLIN HFA) 108 (90 Base) MCG/ACT inhaler Inhale 2 puffs into the lungs every 4 (four) hours as needed for up to 14 days for wheezing or shortness of breath. 11/05/18 11/19/18  Noemi Chapel, MD  ALPRAZolam Duanne Moron) 0.5 MG tablet Take 0.5 tablets (0.25 mg total) by mouth 3 (three) times daily as needed for anxiety. 08/31/18   [name], MD  aspirin EC 81 MG EC tablet Take 1 tablet (81 mg total) by mouth daily. 08/14/18   Angiulli, Lavon Paganini, PA-C  camphor-menthol (SARNA) lotion Apply 1 application topically daily as needed for itching. 08/24/18   Aline August, MD  carbamide peroxide (DEBROX) 6.5 % OTIC solution Place 5 drops into the right ear 2 (two) times daily. 11/19/18   [name], MD  colchicine 0.6 MG tablet Take 1 tablet (0.6 mg total) by mouth daily. 10/22/18   [name], MD  diclofenac sodium (VOLTAREN) 1 % GEL Apply 2 g topically 4 (four) times daily. 12/19/18   Jamse Arn, MD  furosemide (LASIX) 20 MG tablet Take 1 tablet (20 mg total) by mouth daily. 08/14/18   Angiulli, Lavon Paganini, PA-C  Lifitegrast Shirley Friar) 5 % SOLN Place 1-2 drops into both eyes daily.     [provider]  magnesium oxide (MAG-OX) 400 (241.3 Mg) MG tablet Take 1 tablet (400 mg total) by mouth 2 (two) times daily. 08/14/18   Angiulli, Lavon Paganini, PA-C  metoprolol tartrate (LOPRESSOR) 25 MG tablet Take 0.5 tablets (12.5 mg total) by mouth 2 (two) times daily. 10/04/18   [name], MD  mirabegron ER (MYRBETRIQ) 25 MG TB24 tablet Take 1 tablet (25 mg total) by mouth daily. 10/16/18   [name], MD  neomycin-polymyxin-hydrocortisone (CORTISPORIN) OTIC solution Place 4 drops into the right ear 4 (four) times daily. 11/27/18   [name], MD  nitroGLYCERIN (NITROSTAT) 0.4 MG SL tablet  Place 1 tablet (0.4 mg total) under the tongue every 5 (five) minutes as needed for chest pain. 12/06/17   Daune Perch, NP  pantoprazole (PROTONIX) 40 MG tablet Take 1 tablet (40 mg total) by mouth every other day. 08/14/18   Angiulli, Lavon Paganini, PA-C  polyethylene glycol (MIRALAX / GLYCOLAX) 17 g packet Take 17 g by mouth daily as needed for mild constipation. 08/14/18   Angiulli, Lavon Paganini, PA-C  rosuvastatin (CRESTOR) 5 MG tablet Take 1 tablet (5 mg total) by mouth daily at 6 PM. 08/14/18   Angiulli, Lavon Paganini, PA-C  tamsulosin (FLOMAX) 0.4 MG CAPS capsule Take 2 capsules (0.8 mg total) by mouth daily after supper. 11/27/18   [name], MD    Family History Family History  Problem Relation Age of Onset  . Arthritis-Osteo Sister   . Heart attack Brother   . Heart attack Other   . Cancer Neg Hx     Social History Social History   Tobacco Use  . Smoking status: Never Smoker  . Smokeless tobacco: Former Systems developer    Types: Chew  . Tobacco comment: "quit chewing in the 1960s"  Substance Use Topics  . Alcohol use: No  . Drug use: No     Allergies   Feldene [piroxicam], Statins, Doxycycline, Latex, Tequin, and Valium   Review of Systems Review of Systems  Musculoskeletal: Positive for joint swelling and myalgias.  All other systems reviewed and are negative.    Physical Exam Updated Vital Signs BP 104/87 (BP Location: Left Arm)   Pulse 66   Temp 97.6 F (36.4 C) (Oral)   Resp 18   SpO2 99%   Physical Exam Vitals signs and nursing note reviewed.  Constitutional:      Appearance: He is well-developed.  HENT:     Head: Normocephalic and atraumatic.  Eyes:     Conjunctiva/sclera: Conjunctivae normal.  Neck:     Musculoskeletal: Neck supple.  Cardiovascular:     Rate and Rhythm: Normal rate and regular rhythm.     Heart sounds: No murmur.  Pulmonary:  Effort: Pulmonary effort is normal. No respiratory distress.     Breath sounds: Normal breath sounds.   Abdominal:     Palpations: Abdomen is soft.     Tenderness: There is no abdominal tenderness.  Musculoskeletal:        General: Signs of injury present. No swelling or deformity.  Skin:    General: Skin is warm and dry.  Neurological:     General: No focal deficit present.     Mental Status: He is alert.  Psychiatric:        Mood and Affect: Mood normal.      ED Treatments / Results  Labs (all labs ordered are listed, but only abnormal results are displayed) Labs Reviewed  CBC WITH DIFFERENTIAL/PLATELET - Abnormal; Notable for the following components:      Result Value   Monocytes Absolute 1.1 (*)    All other components within normal limits  COMPREHENSIVE METABOLIC PANEL - Abnormal; Notable for the following components:   BUN 28 (*)    Creatinine, Ser 1.45 (*)    Total Bilirubin 1.4 (*)    GFR calc non Af Amer 42 (*)    GFR calc Af Amer 49 (*)    All other components within normal limits    EKG None  Radiology Dg Tibia/fibula Right  Result Date: 12/24/2018 CLINICAL DATA:  Pain in the right lower leg with swelling of the distal lower leg, ankle and dorsal aspect of the foot. EXAM: RIGHT TIBIA AND FIBULA - 2 VIEW COMPARISON:  Foot radiographs dated 01/22/2009 FINDINGS: Right total knee prosthesis in place. No evidence of loosening. No joint effusion. The right tibia and fibula otherwise appear normal. Ankle joint appears normal. Extensive arterial calcifications in the lower leg. Previous surgery in the right midfoot. IMPRESSION: No acute abnormality of the right tibia or fibula. No evidence of loosening of the prosthesis. Electronically Signed   By: Lorriane Shire M.D.   On: 12/24/2018 16:06   Dg Femur Min 2 Views Right  Result Date: 12/24/2018 CLINICAL DATA:  Right thigh pain. EXAM: RIGHT FEMUR 2 VIEWS COMPARISON:  None. FINDINGS: There is no fracture or dislocation or joint space narrowing. Right total knee prosthesis in place with no evidence of loosening. No joint  effusion. Calcific tendinopathy or calcific bursitis adjacent to the right greater trochanter. Extensive arterial calcification in the thigh. IMPRESSION: No acute abnormality. Calcific tendinopathy or calcific bursitis adjacent to the right greater trochanter. Right total knee prosthesis appears to be in good position. Electronically Signed   By: Lorriane Shire M.D.   On: 12/24/2018 16:07   Vas [place] Lower Extremity Venous (dvt) (only [facility])  Result Date: 12/24/2018  Lower Venous Study Indications: Pain.  Risk Factors: None identified. Limitations: Poor ultrasound/tissue interface and patient movement. Comparison Study: No prior studies. Performing Technologist: Oliver Hum RVT  Examination Guidelines: A complete evaluation includes B-mode imaging, spectral Doppler, color Doppler, and power Doppler as needed of all accessible portions of each vessel. Bilateral testing is considered an integral part of a complete examination. Limited examinations for reoccurring indications may be performed as noted.  +---------+---------------+---------+-----------+----------+--------------+ RIGHT    CompressibilityPhasicitySpontaneityPropertiesThrombus Aging +---------+---------------+---------+-----------+----------+--------------+ CFV      Full           Yes      Yes                                 +---------+---------------+---------+-----------+----------+--------------+  SFJ      Full                                                        +---------+---------------+---------+-----------+----------+--------------+ FV Prox  Full                                                        +---------+---------------+---------+-----------+----------+--------------+ FV Mid   Full                                                        +---------+---------------+---------+-----------+----------+--------------+ FV DistalFull                                                         +---------+---------------+---------+-----------+----------+--------------+ PFV      Full                                                        +---------+---------------+---------+-----------+----------+--------------+ POP      Full           Yes      Yes                                 +---------+---------------+---------+-----------+----------+--------------+ PTV      Full                                                        +---------+---------------+---------+-----------+----------+--------------+ PERO     Full                                                        +---------+---------------+---------+-----------+----------+--------------+   +----+---------------+---------+-----------+----------+--------------+ LEFTCompressibilityPhasicitySpontaneityPropertiesThrombus Aging +----+---------------+---------+-----------+----------+--------------+ CFV Full           Yes      Yes                                 +----+---------------+---------+-----------+----------+--------------+     Summary: Right: There is no evidence of deep vein thrombosis in the lower extremity. However, portions of this examination were limited- see technologist comments above. No cystic structure found in the popliteal fossa. Left: No evidence of common femoral vein obstruction.  *See table(s) above for measurements and observations.  Preliminary     Procedures Procedures (including critical care time)  Medications Ordered in ED Medications - No data to display   Initial Impression / Assessment and Plan / ED Course  I have reviewed the triage vital signs and the nursing notes.  Pertinent labs & imaging results that were available during my care of the patient were reviewed by me and considered in my medical decision making (see chart for details).        An After Visit Summary was printed and given to the patient.   Final Clinical Impressions(s) / ED Diagnoses   Final  diagnoses:  None    ED Discharge Orders    None       Fransico Meadow, [place] 12/24/18 1742    Sherwood Gambler, MD 12/30/18 [phone]

## 2018-12-24 NOTE — Discharge Instructions (Addendum)
Continue tylenol for pain.  Return if any problems.

## 2018-12-25 ENCOUNTER — Ambulatory Visit: Payer: Medicare HMO | Admitting: Family Medicine

## 2018-12-25 DIAGNOSIS — J449 Chronic obstructive pulmonary disease, unspecified: Secondary | ICD-10-CM | POA: Diagnosis not present

## 2018-12-25 DIAGNOSIS — Z85118 Personal history of other malignant neoplasm of bronchus and lung: Secondary | ICD-10-CM | POA: Diagnosis not present

## 2018-12-26 ENCOUNTER — Other Ambulatory Visit: Payer: Self-pay

## 2018-12-26 ENCOUNTER — Telehealth (INDEPENDENT_AMBULATORY_CARE_PROVIDER_SITE_OTHER): Payer: Medicare HMO | Admitting: Interventional Cardiology

## 2018-12-26 ENCOUNTER — Encounter: Payer: Self-pay | Admitting: Interventional Cardiology

## 2018-12-26 ENCOUNTER — Telehealth: Payer: Self-pay | Admitting: Interventional Cardiology

## 2018-12-26 VITALS — HR 62 | Ht 65.0 in | Wt 165.0 lb

## 2018-12-26 DIAGNOSIS — I2699 Other pulmonary embolism without acute cor pulmonale: Secondary | ICD-10-CM

## 2018-12-26 DIAGNOSIS — I2510 Atherosclerotic heart disease of native coronary artery without angina pectoris: Secondary | ICD-10-CM | POA: Diagnosis not present

## 2018-12-26 DIAGNOSIS — I480 Paroxysmal atrial fibrillation: Secondary | ICD-10-CM | POA: Diagnosis not present

## 2018-12-26 NOTE — Telephone Encounter (Signed)
Spoke with the patient's daughter earlier. Appointment has been completed.

## 2018-12-26 NOTE — Patient Instructions (Signed)
Medication Instructions:  Your physician recommends that you continue on your current medications as directed. Please refer to the Current Medication list given to you today.  WE WILL CHECK WITH NEUROLOGY AND OUR PHARM D REGARDING XARELTO. WE WILL BE IN TOUCH.  If you need a refill on your cardiac medications before your next appointment, please call your pharmacy.   Lab work: None Ordered  If you have labs (blood work) drawn today and your tests are completely normal, you will receive your results only by: Marland Kitchen MyChart Message (if you have MyChart) OR . A paper copy in the mail If you have any lab test that is abnormal or we need to change your treatment, we will call you to review the results.  Testing/Procedures: None ordered  Follow-Up: At [facility], you and your health needs are our priority.  As part of our continuing mission to provide you with exceptional heart care, we have created designated Provider Care Teams.  These Care Teams include your primary Cardiologist (physician) and Advanced Practice Providers (APPs -  Physician Assistants and Nurse Practitioners) who all work together to provide you with the care you need, when you need it. . You will need a follow up appointment in 6 months.  Please call our office 2 months in advance to schedule this appointment.  You may see Casandra Doffing, MD or one of the following Advanced Practice Providers on your designated Care Team:   . Lyda Jester, PA-C . Dayna Dunn, PA-C . Ermalinda Barrios, PA-C  Any Other Special Instructions Will Be Listed Below (If Applicable).

## 2018-12-26 NOTE — Progress Notes (Signed)
I have reviewed the patient's chart.  He had a hemorrhagic left thalamic infarct in April 2020 while he was on therapeutic Lovenox.  He does have a history of both A. fib and pulmonary embolism and risk-benefit would favor restarting anticoagulation at the present time I agree it may be safe now to start him on long-term anticoagulation and either Xarelto or Eliquis would be okay.  Kindly arrange for the same

## 2018-12-26 NOTE — Progress Notes (Signed)
Virtual Visit via Telephone Note   This visit type was conducted due to national recommendations for restrictions regarding the COVID-19 Pandemic (e.g. social distancing) in an effort to limit this patient's exposure and mitigate transmission in our community.  Due to his co-morbid illnesses, this patient is at least at moderate risk for complications without adequate follow up.  This format is felt to be most appropriate for this patient at this time.  The patient did not have access to video technology/had technical difficulties with video requiring transitioning to audio format only (telephone).  All issues noted in this document were discussed and addressed.  No physical exam could be performed with this format.  Please refer to the patient's chart for his  consent to telehealth for [facility].   Date:  12/26/2018   ID:  [name], DOB 13-Dec-1927, MRN [id number]  Patient Location: Home Provider Location: Office  PCP:  [name], MD  Cardiologist:  Larae Grooms, MD  Electrophysiologist:  None   Evaluation Performed:  Follow-Up Visit  Chief Complaint:  CAD  History of Present Illness:    Evan [name] is a 85 y.o. male with with a history of CAD s/p CABG x2 in 1998 with patent stents and grafts on cardiac catheterization in 06/2017, atrial fibrillation/flutter on Eliquis, chronic diastolic heart failure,Adenocarcinoma of the right lower lungand lung cancerpresents for hospital follow up.   Admitted 02/2018 for chest pain.Found to have acute DVT and PEwhile misseddoses of anticoagulation on Eliquis prior to admission. Echo yesterday shows LVEF 60-65% without regional wall motion abnormalities, there is moderate RAE, but normal RV size and function, not suggestive of RH strain - the PE was noted to be peripheral.  Patient follows with radiation oncology for his lung cancer. Concern for disease progression. Plan is to continue Lovenox indefinitely and follow up  with [facility].    He has had recent issues with thalamic hemorrhage.  He has had a UTI as well. Anticoagulation was stopped after the thalamic bleed.  He has followed up with neurology as well.  BP usually in the 100/60s range when he has therapy.  He walks 50 feet with a walker.  He can feed himself.  Family has to bath him.  His diet is unrestricted as swallowing is better.  Denies : Chest pain. Dizziness.  Nitroglycerin use. Orthopnea. Palpitations. Paroxysmal nocturnal dyspnea. Shortness of breath. Syncope.   Gout in the left- mild swelling.  No sensation in the right leg.   The patient does not have symptoms concerning for COVID-19 infection (fever, chills, cough, or new shortness of breath).    Past Medical History:  Diagnosis Date  . Adenocarcinoma of right lung ([place]) 01/06/2016  . Anginal chest pain at rest [facility])    Chronic non, controlled on Lorazepam  . Anxiety   . Arthritis    "all over"   . BPH (benign prostatic hyperplasia)   . CAD (coronary artery disease)   . Chronic lower back pain   . Complication of anesthesia    "problems making water afterwards"  . Depression   . DJD (degenerative joint disease)   . Esophageal ulcer without bleeding    bid ppi indefinitely  . Family history of adverse reaction to anesthesia    "children w/PONV"  . GERD (gastroesophageal reflux disease)   . Headache    "couple/week maybe" (09/04/2015)  . Hemochromatosis    Possible, elevated iron stores, hook-like osteophytes on hand films, normal LFTs  . Hepatitis    "  yellow jaundice as a baby"  . History of ASCVD    MULTIVESSEL  . Hyperlipidemia   . Hypertension   . Osteoarthritis   . Pneumonia    "several times; got a little now" (09/04/2015)  . Rheumatoid arthritis ([place])   . Subdural hematoma ([facility])    small after fall 09/2016-plavix held, neurosurgery consulted.  Asymptomatic.    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 ARM SKIN LESION BIOPSY / EXCISION Left 09/02/2015  . CATARACT EXTRACTION W/ INTRAOCULAR LENS  IMPLANT, BILATERAL Bilateral   . CHOLECYSTECTOMY OPEN    . CORNEAL TRANSPLANT Bilateral    "one at [facility]; one at [facility]"  . CORONARY ANGIOPLASTY WITH STENT PLACEMENT    . CORONARY ARTERY BYPASS GRAFT  1999  . DESCENDING AORTIC ANEURYSM REPAIR W/ STENT     DE stent ostium into the right radial free graft at OM-, 02-2007  . ESOPHAGOGASTRODUODENOSCOPY N/A 11/09/2014   Procedure: ESOPHAGOGASTRODUODENOSCOPY (EGD);  Surgeon: [name], MD;  Location: [facility] ENDOSCOPY;  Service: Endoscopy;  Laterality: N/A;  . INGUINAL HERNIA REPAIR    . JOINT REPLACEMENT    . LEFT HEART CATH AND CORS/GRAFTS ANGIOGRAPHY N/A 06/27/2017   Procedure: LEFT HEART CATH AND CORS/GRAFTS ANGIOGRAPHY;  Surgeon: [name], MD;  Location: [facility] [facility];  Service: Cardiovascular;  Laterality: N/A;  . NASAL SINUS SURGERY    . SHOULDER OPEN ROTATOR CUFF REPAIR Right   . TOTAL KNEE ARTHROPLASTY Bilateral      No outpatient medications have been marked as taking for the 12/26/18 encounter (Appointment) with Jettie Booze, MD.     Allergies:   Feldene [piroxicam], Statins, Doxycycline, Latex, Tequin, and Valium   Social History   Tobacco Use  . Smoking status: Never Smoker  . Smokeless tobacco: Former Systems developer    Types: Chew  . Tobacco comment: "quit chewing in the 1960s"  Substance Use Topics  . Alcohol use: No  . Drug use: No     Family Hx: The patient's family history includes Arthritis-Osteo in his sister; Heart attack in his brother and another family member. There is no history of Cancer.  ROS:   Please see the history of present illness.    Generalized weakness All other systems reviewed and are negative.   Prior CV studies:   The following studies were reviewed today:  Prior cath results  Labs/Other Tests and Data Reviewed:    EKG:  An ECG dated 09/2018 was  personally reviewed today and demonstrated:  AFib, controlled rate  Recent Labs: 08/23/2018: TSH 1.378 08/24/2018: Magnesium 1.8 08/26/2018: B Natriuretic Peptide 267.7 12/24/2018: ALT 18; BUN 28; Creatinine, Ser 1.45; Hemoglobin 13.4; Platelets 154; Potassium 4.1; Sodium 139   Recent Lipid Panel Lab Results  Component Value Date/Time   CHOL 75 02/03/2018 05:24 AM   CHOL 112 01/19/2018 09:29 AM   CHOL 97 09/09/2013 09:57 AM   TRIG 60 02/03/2018 05:24 AM   TRIG 92 09/09/2013 09:57 AM   HDL 28 (L) 02/03/2018 05:24 AM   HDL 35 (L) 01/19/2018 09:29 AM   HDL 37 (L) 09/09/2013 09:57 AM   CHOLHDL 2.7 02/03/2018 05:24 AM   LDLCALC 35 02/03/2018 05:24 AM   LDLCALC 59 01/19/2018 09:29 AM   LDLCALC 42 09/09/2013 09:57 AM    Wt Readings from Last 3 Encounters:  12/19/18 165 lb (74.8 kg)  12/18/18 165 lb (74.8 kg)  10/30/18 164 lb (74.4 kg)     Objective:    Vital Signs:  There were no vitals taken for this visit.   VITAL SIGNS:  reviewed GEN:  no acute distress RESPIRATORY:  no shortness of breath PSYCH:  flat affect exam limited   ASSESSMENT & PLAN:    1. CAD: Cath in 2018 showed patent grafts.  No angina at this time.  2. PAF: Off of anticoagulation.   Given ICH, I would defer to neuro and pulmonary as to whether anticoagulation can be restarted.  We discussed the risks and benefits of blood thinner.  THey felt that he was bleeding a lot from lovenox.   He is tolerating aspirin 81 mg daily.  For now, continue aspirin.  Will check and see if Xarelto would be safer, with Neuro and PharmD.  Explained at length the risks and benefits.  3. DVT/PE: Occurred in the past, but daughter states she is not sure how compliant he was with Eliquis at that time.  Recent hip/ leg pain a few days ago and w/u was negative for DVT.  Pain has been better.  This patients CHA2DS2-VASc Score and unadjusted Ischemic Stroke Rate (% per year) is equal to 7.2 % stroke rate/year from a score of 5  Above score  calculated as 1 point each if present [CHF, HTN, DM, Vascular=MI/PAD/Aortic Plaque, Age if 65-74, or Male] Above score calculated as 2 points each if present [Age > 75, or Stroke/TIA/TE]    COVID-19 Education: The signs and symptoms of COVID-19 were discussed with the patient and how to seek care for testing (follow up with PCP or arrange E-visit).  The importance of social distancing was discussed today.  Time:   Today, I have spent 23 minutes with the patient with telehealth technology discussing the above problems.     Medication Adjustments/Labs and Tests Ordered: Current medicines are reviewed at length with the patient today.  Concerns regarding medicines are outlined above.   Tests Ordered: No orders of the defined types were placed in this encounter.   Medication Changes: No orders of the defined types were placed in this encounter.   Follow Up:  Virtual Visit or In Person in 6 month(s)  Signed, Larae Grooms, MD  12/26/2018 12:16 PM    [place]

## 2018-12-26 NOTE — Telephone Encounter (Signed)
New Message  Patient's daughter is calling in because a call was missed from the office. Wants to make sure the appointment was still going to happen at the scheduled time. Please call and confirm.

## 2018-12-27 ENCOUNTER — Telehealth: Payer: Self-pay | Admitting: Nurse Practitioner

## 2018-12-27 DIAGNOSIS — I5030 Unspecified diastolic (congestive) heart failure: Secondary | ICD-10-CM | POA: Diagnosis not present

## 2018-12-27 DIAGNOSIS — I25118 Atherosclerotic heart disease of native coronary artery with other forms of angina pectoris: Secondary | ICD-10-CM | POA: Diagnosis not present

## 2018-12-27 DIAGNOSIS — I252 Old myocardial infarction: Secondary | ICD-10-CM | POA: Diagnosis not present

## 2018-12-27 DIAGNOSIS — R531 Weakness: Secondary | ICD-10-CM | POA: Diagnosis not present

## 2018-12-27 DIAGNOSIS — R2689 Other abnormalities of gait and mobility: Secondary | ICD-10-CM | POA: Diagnosis not present

## 2018-12-27 DIAGNOSIS — I13 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 failure and stage 1 through stage 4 chronic kidney disease, or unspecified chronic kidney disease: Secondary | ICD-10-CM | POA: Diagnosis not present

## 2018-12-27 DIAGNOSIS — I69218 Other symptoms and signs involving cognitive functions following other nontraumatic intracranial hemorrhage: Secondary | ICD-10-CM | POA: Diagnosis not present

## 2018-12-27 DIAGNOSIS — R270 Ataxia, unspecified: Secondary | ICD-10-CM | POA: Diagnosis not present

## 2018-12-27 DIAGNOSIS — I69298 Other sequelae of other nontraumatic intracranial hemorrhage: Secondary | ICD-10-CM | POA: Diagnosis not present

## 2018-12-27 MED ORDER — RIVAROXABAN 15 MG PO TABS: 15.0000 mg | ORAL_TABLET | Freq: Every day | ORAL | 3 refills | Status: DC

## 2018-12-27 NOTE — Addendum Note (Signed)
Addended by: Emmaline Life on: 12/27/2018 04:28 PM   Modules accepted: Orders

## 2018-12-27 NOTE — Progress Notes (Signed)
Melissa, Please tell me what dose of Xarelto would be appropriate for this patient.  COmpliance with BID Eliquis was an issue in the past.

## 2018-12-27 NOTE — Progress Notes (Signed)
Thanks. Agree with plan 

## 2018-12-27 NOTE — Telephone Encounter (Signed)
See addendum to patient's telehealth visit on 9/23 with Dr. Irish Lack. I spoke with patient's daughter, [name], per [facility] and advised that Dr. Irish Lack has ordered Xarelto 15 mg to be taken with heaviest meal of the day (which daughter states is dinner). I advised her to have patient stop aspirin. She verbalized understanding and agreement with plan and thanked me for the call.

## 2018-12-27 NOTE — Progress Notes (Signed)
Thanks Pramod.

## 2018-12-28 NOTE — Progress Notes (Signed)
Would stop aspirin after he starts XArelto.

## 2019-01-01 NOTE — Progress Notes (Signed)
Attempted to contact patient to ensure ASA was stopped with Xarelto  but there was no answer and VM was full.

## 2019-01-04 ENCOUNTER — Encounter: Payer: Self-pay | Admitting: Family Medicine

## 2019-01-04 ENCOUNTER — Ambulatory Visit: Payer: Medicare HMO | Admitting: Interventional Cardiology

## 2019-01-04 ENCOUNTER — Other Ambulatory Visit: Payer: Self-pay

## 2019-01-04 ENCOUNTER — Ambulatory Visit (INDEPENDENT_AMBULATORY_CARE_PROVIDER_SITE_OTHER): Payer: Medicare HMO | Admitting: Family Medicine

## 2019-01-04 VITALS — BP 118/78 | HR 70 | Temp 97.9°F | Resp 16 | Ht 68.0 in

## 2019-01-04 DIAGNOSIS — R350 Frequency of micturition: Secondary | ICD-10-CM | POA: Diagnosis not present

## 2019-01-04 DIAGNOSIS — M5431 Sciatica, right side: Secondary | ICD-10-CM | POA: Diagnosis not present

## 2019-01-04 DIAGNOSIS — M25511 Pain in right shoulder: Secondary | ICD-10-CM

## 2019-01-04 DIAGNOSIS — Z23 Encounter for immunization: Secondary | ICD-10-CM

## 2019-01-04 DIAGNOSIS — N3281 Overactive bladder: Secondary | ICD-10-CM | POA: Diagnosis not present

## 2019-01-04 DIAGNOSIS — H6021 Malignant otitis externa, right ear: Secondary | ICD-10-CM | POA: Diagnosis not present

## 2019-01-04 MED ORDER — NEOMYCIN-POLYMYXIN-HC 3.5-10000-1 OT SOLN: 4.0000 [drp] | Freq: Four times a day (QID) | OTIC | 2 refills | Status: DC

## 2019-01-04 MED ORDER — GABAPENTIN 100 MG PO CAPS: 100.0000 mg | ORAL_CAPSULE | Freq: Three times a day (TID) | ORAL | 3 refills | Status: DC

## 2019-01-04 NOTE — Progress Notes (Signed)
Subjective:    Patient ID: Evan [name], male    DOB: 22-Feb-1928, 85 y.o.   MRN: [id number]  HPI Since I last saw the patient, several things have happened.  Patient was on lifelong Lovenox after suffering a pulmonary embolism presumed due to hypercoagulable state brought on by his lung cancer.  The pulmonary embolism occurred while the patient was taking Eliquis prompting the lifelong administration of Lovenox.  However, patient suffered an intracranial hemorrhage as well as a fall.  It is uncertain as to whether the fall led to the hemorrhage or whether the hemorrhage led to the fall.  However due to this, the patient was hospitalized for more than a month in inpatient rehab.  Obviously his Lovenox was discontinued and at the current time he is only taking an aspirin.  Patient has atrial fibrillation as well as a history of a pulmonary embolism but at the present time he has not been cleared to resume any anticoagulant beyond the aspirin.  During that time, the patient lost the use of the right side of his body.  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  Due to the COVID-19 pandemic, his family was unable to visit him in the hospital.  All this is extremely disorienting to an 85 year old individual.  Prior to being hospitalized, the patient was taking Xanax 2-3 times a day.  He had been taking this for more than 15 years.  He came to me on this dosage and I have been his doctor for 12 years.  Patient had longstanding dependency to Xanax and benzodiazepine due to anxiety.  This was obviously discontinued after his fall and intracranial hemorrhage primarily for neuro checks but also to reduce the risk of confusion as the patient was also suffering metabolic encephalopathy.  He has not resumed this medication since he went home.  Since going home from the hospital however EMS has been called at least 3 times due to sudden attacks of dyspnea.  He has been back to the hospital at least  twice once with metabolic encephalopathy due to urinary tract infection as well as subjective dyspnea.  The second time was for dyspnea for unknown causes.  He was treated empirically as a COPD exacerbation but based on the discharge summary the findings were not impressive for COPD as a potential cause.  Currently he is using albuterol in the morning regardless of symptoms and then nothing later in the day.  He continues to have attacks of dyspnea having only been home from the hospital for 3 to 4 days.  Both I and his daughter believe a lot of this is anxiety and panic due to the situation that has occurred and the abrupt discontinuation of his longstanding benzodiazepine.  At that time, my plan was: I spent over 30 minutes today with the patient and his daughter reviewing his recent discharge summaries as well as the events that have occurred and transpired over the last 2 months.  This is a difficult situation.  Patient has numerous risk factors for dyspnea including COPD, atrial fibrillation, history of coronary artery disease, history of a pulmonary embolism, lung cancer with persistent right hilar lymphadenopathy.  However I truly believe the majority of his dyspnea is likely anxiety related for the factors I have outlined in the history of present illness.  I had a long discussion today with his daughter about the risk of resuming Xanax.  The patient is somewhat confused.  He is  also in a wheelchair due to weakness and balance issues after his intracranial hemorrhage.  I explained that benzodiazepines can exacerbate confusion.  They can exacerbate falls and balance issues.  However the patient has been rushed to the hospital several times in the last month due to dyspnea with no source identified.  EMS is also been called to the home several times for dyspnea even when the patient is in no apparent respiratory distress.  I believe these are likely panic attacks.  It is apparent to me that the patient is  approaching the end of his life.  He has wasted dramatically since his last visit here due to everything that is occurred.  As I explained to his daughter, I would like to focus on his comfort and quality of life.  Despite the risk of resuming benzodiazepines, I do believe this would likely improve his quality of life and lessen his subjective dyspnea.  Therefore I gave the patient a prescription for Xanax 0.5 mg.  Have instructed his daughter to give him 1/2 tablet every 8 hours as needed for panic or dyspnea.  We will also try the patient on Anoro 1 inhalation a day in case COPD is playing a role in his shortness of breath.  I also recommended they reduce his metoprolol to 1/2 tablet daily due to bradycardia on his exam today and low blood pressure and in 1 week continue the medication altogether as long as his heart rate remains low and his blood pressure remains low.  10/15/18 Since being home from the hospital, the patient's daughter states that every night, he has to go to the bathroom almost every hour.  He feels a sudden urge to urinate.  However after going to the bathroom, his symptoms returned shortly thereafter.  He reports a normal urinary stream.  At times this week however the majority the time he is able to make urine.  He is already on Flomax 0.4 mg p.o. nightly.  However this started abruptly after discharge from the hospital.  Urinalysis today shows no leukocyte esterase.  It shows no nitrites or signs of urinary tract infection.  Differential diagnosis would be BPH with lower urinary tract symptoms versus overactive bladder/bladder spasms.  He does not have any sudden urge to urinate during the daytime.  Is primarily at night once he is supine in bed.  He also has trace pitting edema in his left leg greater than his right leg.  This was ruled out to be a DVT in the hospital however he has numerous varicose veins in his leg and I believe he has some pitting edema secondary to venous  insufficiency.  However in his left foot he has significant pain to minimal palpation over the midfoot and significant pain over the first MTP joint.  It hurts simply to touch it.  There is some mild swelling around the MTP joint.  He has not had any x-rays.  He also has several thick yellow toenails that need to be trimmed.  His daughter asked me to trim all of his toenails today.  Using a pair of rongeurs, I trimmed all of his toenails.  At that time, my plan was: 1. Frequent urination Differential diagnosis includes BPH with lower urinary tract symptoms versus overactive bladder.  First we will try increasing his Flomax to 0.8 mg p.o. nightly.  Reassess in 1 to 2 weeks.  If no better, we will try adding Vesicare 10 mg p.o. daily for possible bladder spasm/overactive bladder. -  Urinalysis, Routine w reflex microscopic  2. Podagra Suspect gout as a cause of his first MTP joint pain and swelling.  Try prednisone taper pack and check a uric acid level.  If no better obtain an x-ray of the foot - Uric acid - BASIC METABOLIC PANEL WITH GFR  3. Acquired dysmorphic toenail Trimmed all 10 toenails with a pair of rongeurs  4. Chronic venous insufficiency No further treatment is necessary for the edema.  Reassured the patient and his daughter that this is physiologic and not a sign of a serious underlying medical illness  10/30/18 Patient's insurance would not cover Vesicare.  However he will cover Myrbetriq 25 mg a day.  He has not been taking this however.  His daughter was afraid to give him the medication.  He has completed Keflex and is recently started the Augmentin.  His daughter states that the color of the urine has cleared.  The smell has improved.  However the frequency continues to be the same.  This is his third urinary tract infection in less than 2 months.  This raises the concern that his recurrent urinary tract infections may be due to seeding from another source such as chronic  prostatitis.  He denies any rectal pain.  He denies any hematuria.  He denies any dysuria. July 21, urinalysis was markedly abnormal and urine culture showed E. coli resistant to numerous antibiotics.  He was placed on Keflex.  However given the resistance pattern of the infection, I recommended switching to Augmentin 875 mg twice daily for 7 days.  He is here today to recheck.  01/04/19 Was seen in the emergency room 9/21 for right leg pain.  At that time venous ultrasound revealed no DVT.  CMP and CBC were normal aside from mild elevations in creatinine.  Patient reports pain radiating down his right leg.  The pain starts in his right hip radiates down his right thigh into his right knee down into his right calf.  The pain is a deep aching pain.  He denies any numbness.  It is a shooting constant pain.  X-rays in the emergency room of the femur and tibia and fibula were normal.  He has had a right knee replacement.  He denies any back pain.  He denies any saddle anesthesia.  He does demonstrate spasticity in the right upper extremity and the right lower extremity after the stroke.  He also has myoclonic jerks.Marland Kitchen  He also complains of right shoulder pain.  He has pain with abduction greater than 90 degrees.  He has pain with range of motion.  He has pain with internal and external rotation.  He also complains of itching in his right ear.  Examination of the right auditory canal reveals white exudate and erythema consistent with otitis externa.  His daughter also request a referral to urology.  He continues to have polyuria, dysuria, increased urgency and frequency.  Past Medical History:  Diagnosis Date  . Adenocarcinoma of right lung ([place]) 01/06/2016  . Anginal chest pain at rest [facility])    Chronic non, controlled on Lorazepam  . Anxiety   . Arthritis    "all over"   . BPH (benign prostatic hyperplasia)   . CAD (coronary artery disease)   . Chronic lower back pain   . Complication of anesthesia     "problems making water afterwards"  . Depression   . DJD (degenerative joint disease)   . Esophageal ulcer without bleeding    bid ppi  indefinitely  . Family history of adverse reaction to anesthesia    "children w/PONV"  . GERD (gastroesophageal reflux disease)   . Headache    "couple/week maybe" (09/04/2015)  . Hemochromatosis    Possible, elevated iron stores, hook-like osteophytes on hand films, normal LFTs  . Hepatitis    "yellow jaundice as a baby"  . History of ASCVD    MULTIVESSEL  . Hyperlipidemia   . Hypertension   . Osteoarthritis   . Pneumonia    "several times; got a little now" (09/04/2015)  . Rheumatoid arthritis ([place])   . Subdural hematoma ([facility])    small after fall 09/2016-plavix held, neurosurgery consulted.  Asymptomatic.    Marland Kitchen 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[place]) 02/2018   on Lovenox lifelong since he failed PO Eliquis   Past Surgical History:  Procedure Laterality Date  . ARM SKIN LESION BIOPSY / EXCISION Left 09/02/2015  . CATARACT EXTRACTION W/ INTRAOCULAR LENS  IMPLANT, BILATERAL Bilateral   . CHOLECYSTECTOMY OPEN    . CORNEAL TRANSPLANT Bilateral    "one at [facility]; one at [facility]"  . CORONARY ANGIOPLASTY WITH STENT PLACEMENT    . CORONARY ARTERY BYPASS GRAFT  1999  . DESCENDING AORTIC ANEURYSM REPAIR W/ STENT     DE stent ostium into the right radial free graft at OM-, 02-2007  . ESOPHAGOGASTRODUODENOSCOPY N/A 11/09/2014   Procedure: ESOPHAGOGASTRODUODENOSCOPY (EGD);  Surgeon: [name], MD;  Location: [facility] ENDOSCOPY;  Service: Endoscopy;  Laterality: N/A;  . INGUINAL HERNIA REPAIR    . JOINT REPLACEMENT    . LEFT HEART CATH AND CORS/GRAFTS ANGIOGRAPHY N/A 06/27/2017   Procedure: LEFT HEART CATH AND CORS/GRAFTS ANGIOGRAPHY;  Surgeon: [name], MD;  Location: [facility] CV LAB;  Service: Cardiovascular;  Laterality: N/A;  . NASAL SINUS SURGERY    . SHOULDER OPEN ROTATOR CUFF REPAIR Right   . TOTAL KNEE ARTHROPLASTY Bilateral    Current Outpatient Medications on File  Prior to Visit  Medication Sig Dispense Refill  . acetaminophen (TYLENOL) 325 MG tablet Take 1-2 tablets (325-650 mg total) by mouth every 4 (four) hours as needed for mild pain.    Marland Kitchen ALPRAZolam (XANAX) 0.5 MG tablet Take 0.5 tablets (0.25 mg total) by mouth 3 (three) times daily as needed for anxiety. 30 tablet 0  . camphor-menthol (SARNA) lotion Apply 1 application topically daily as needed for itching.    . diclofenac sodium (VOLTAREN) 1 % GEL Apply 2 g topically 4 (four) times daily. 350 g 1  . furosemide (LASIX) 20 MG tablet Take 1 tablet (20 mg total) by mouth daily. 30 tablet 0  . Lifitegrast (XIIDRA) 5 % SOLN Place 1-2 drops into both eyes daily.     . magnesium oxide (MAG-OX) 400 (241.3 Mg) MG tablet Take 1 tablet (400 mg total) by mouth 2 (two) times daily. 180 tablet 3  . metoprolol tartrate (LOPRESSOR) 25 MG tablet Take 0.5 tablets (12.5 mg total) by mouth 2 (two) times daily. 90 tablet 3  . nitroGLYCERIN (NITROSTAT) 0.4 MG SL tablet Place 1 tablet (0.4 mg total) under the tongue every 5 (five) minutes as needed for chest pain. 25 tablet 2  . polyethylene glycol (MIRALAX / GLYCOLAX) 17 g packet Take 17 g by mouth daily as needed for mild constipation. 14 each 0  . Rivaroxaban (XARELTO) 15 MG TABS tablet Take 1 tablet (15 mg total) by mouth daily with supper. 90 tablet 3  . rosuvastatin (CRESTOR) 5 MG tablet Take 1 tablet (5 mg total) by mouth  daily at 6 PM. 30 tablet 0  . tamsulosin (FLOMAX) 0.4 MG CAPS capsule Take 2 capsules (0.8 mg total) by mouth daily after supper. 180 capsule 3  . albuterol (VENTOLIN HFA) 108 (90 Base) MCG/ACT inhaler Inhale 2 puffs into the lungs every 4 (four) hours as needed for up to 14 days for wheezing or shortness of breath. 18 g 0   No current facility-administered medications on file prior to visit.    Allergies  Allergen Reactions  . Feldene [Piroxicam] Other (See Comments)    REACTION: Blisters  . Statins Other (See Comments)    Myalgias  .  Doxycycline Other (See Comments)    REACTION:  unknown  . Latex Rash  . Tequin Anxiety  . Valium Other (See Comments)    REACTION:  unknown   Social History   Socioeconomic History  . Marital status: Widowed    Spouse name: Not on file  . Number of children: Not on file  . Years of education: Not on file  . Highest education level: Not on file  Occupational History  . Not on file  Social Needs  . Financial resource strain: Not on file  . Food insecurity    Worry: Not on file    Inability: Not on file  . Transportation needs    Medical: Not on file    Non-medical: Not on file  Tobacco Use  . Smoking status: Never Smoker  . Smokeless tobacco: Former Systems developer    Types: Chew  . Tobacco comment: "quit chewing in the 1960s"  Substance and Sexual Activity  . Alcohol use: No  . Drug use: No  . Sexual activity: Never  Lifestyle  . Physical activity    Days per week: Not on file    Minutes per session: Not on file  . Stress: Not on file  Relationships  . Social Herbalist on phone: Not on file    Gets together: Not on file    Attends religious service: Not on file    Active member of club or organization: Not on file    Attends meetings of clubs or organizations: Not on file    Relationship status: Not on file  . Intimate partner violence    Fear of current or ex partner: Not on file    Emotionally abused: Not on file    Physically abused: Not on file    Forced sexual activity: Not on file  Other Topics Concern  . Not on file  Social History Narrative   01-05-18 Unable to ask abuse questions family with him today.   11-012-19 Unable to ask abuse questions family with him today.      Review of Systems  All other systems reviewed and are negative.      Objective:   Physical Exam Constitutional:      Appearance: He is underweight.  HENT:     Right Ear: Decreased hearing noted.     Left Ear: Decreased hearing noted.     Nose: Nose normal. No congestion  or rhinorrhea.  Cardiovascular:     Rate and Rhythm: Bradycardia present. Rhythm irregular.  Pulmonary:     Effort: Pulmonary effort is normal. No respiratory distress.     Breath sounds: No wheezing or rales.  Abdominal:     General: Bowel sounds are normal.     Palpations: Abdomen is soft.  Musculoskeletal:     Right shoulder: He exhibits decreased range of motion, tenderness and  pain.     Right hip: He exhibits decreased strength and tenderness. He exhibits normal range of motion.     Right knee: He exhibits normal range of motion. No tenderness found. No medial joint line and no lateral joint line tenderness noted.     Right lower leg: No edema.     Left lower leg: No edema.  Neurological:     Mental Status: He is alert and oriented to person, place, and time.     Motor: Weakness present.     Gait: Gait abnormal.  Psychiatric:        Mood and Affect: Mood normal.        Behavior: Behavior normal.   In wheelchair        Assessment & Plan:   Frequent urination - Plan: Ambulatory referral to Urology  Right sided sciatica  Acute pain of right shoulder  Acute malignant otitis externa of right ear  OAB (overactive bladder) - Plan: Ambulatory referral to Urology    Patient's physical exam today is significant for pain in his right shoulder.  I believe some of this is subacromial bursitis due to sleeping on his arm.  Using sterile technique I injected the right subacromial space with 2 cc lidocaine, 2 cc of Marcaine, and 2 cc of 40 mg/mL Kenalog.  The patient tolerated the procedure well without complication.  I believe the pain in his right leg sounds more neuropathic-like sciatica.  The pain involves his right gluteus, his right hip, his right knee, and his right lower extremity.  X-rays, ultrasound have all been normal.  Exam today is unremarkable aside from pain.  Therefore I would recommend trying gabapentin 100 mg p.o. 3 times daily as needed nerve pain.  I believe that  he has swimmer's ear in his right ear due to his hearing aids.  I recommended Cortisporin HC otic 4 drops 4 times daily for 1 week.  I believe he has overactive bladder related to his age.  However the patient's daughter would like a second opinion and therefore I will gladly consult urology

## 2019-01-04 NOTE — Addendum Note (Signed)
Addended by: Shary Decamp B on: 01/04/2019 11:12 AM   Modules accepted: Orders

## 2019-01-07 DIAGNOSIS — R531 Weakness: Secondary | ICD-10-CM | POA: Diagnosis not present

## 2019-01-07 DIAGNOSIS — I5030 Unspecified diastolic (congestive) heart failure: Secondary | ICD-10-CM | POA: Diagnosis not present

## 2019-01-07 DIAGNOSIS — I69218 Other symptoms and signs involving cognitive functions following other nontraumatic intracranial hemorrhage: Secondary | ICD-10-CM | POA: Diagnosis not present

## 2019-01-07 DIAGNOSIS — I252 Old myocardial infarction: Secondary | ICD-10-CM | POA: Diagnosis not present

## 2019-01-07 DIAGNOSIS — I69298 Other sequelae of other nontraumatic intracranial hemorrhage: Secondary | ICD-10-CM | POA: Diagnosis not present

## 2019-01-07 DIAGNOSIS — R270 Ataxia, unspecified: Secondary | ICD-10-CM | POA: Diagnosis not present

## 2019-01-07 DIAGNOSIS — I13 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 failure and stage 1 through stage 4 chronic kidney disease, or unspecified chronic kidney disease: Secondary | ICD-10-CM | POA: Diagnosis not present

## 2019-01-07 DIAGNOSIS — I25118 Atherosclerotic heart disease of native coronary artery with other forms of angina pectoris: Secondary | ICD-10-CM | POA: Diagnosis not present

## 2019-01-07 DIAGNOSIS — R2689 Other abnormalities of gait and mobility: Secondary | ICD-10-CM | POA: Diagnosis not present

## 2019-01-09 DIAGNOSIS — I69218 Other symptoms and signs involving cognitive functions following other nontraumatic intracranial hemorrhage: Secondary | ICD-10-CM | POA: Diagnosis not present

## 2019-01-09 DIAGNOSIS — R270 Ataxia, unspecified: Secondary | ICD-10-CM | POA: Diagnosis not present

## 2019-01-09 DIAGNOSIS — I5030 Unspecified diastolic (congestive) heart failure: Secondary | ICD-10-CM | POA: Diagnosis not present

## 2019-01-09 DIAGNOSIS — R2689 Other abnormalities of gait and mobility: Secondary | ICD-10-CM | POA: Diagnosis not present

## 2019-01-09 DIAGNOSIS — I25118 Atherosclerotic heart disease of native coronary artery with other forms of angina pectoris: Secondary | ICD-10-CM | POA: Diagnosis not present

## 2019-01-09 DIAGNOSIS — I69298 Other sequelae of other nontraumatic intracranial hemorrhage: Secondary | ICD-10-CM | POA: Diagnosis not present

## 2019-01-09 DIAGNOSIS — I13 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 failure and stage 1 through stage 4 chronic kidney disease, or unspecified chronic kidney disease: Secondary | ICD-10-CM | POA: Diagnosis not present

## 2019-01-09 DIAGNOSIS — R531 Weakness: Secondary | ICD-10-CM | POA: Diagnosis not present

## 2019-01-09 DIAGNOSIS — I252 Old myocardial infarction: Secondary | ICD-10-CM | POA: Diagnosis not present

## 2019-01-10 NOTE — Progress Notes (Signed)
Patient has stopped ASA since starting Xarelto.

## 2019-01-14 DIAGNOSIS — I610 Nontraumatic intracerebral hemorrhage in hemisphere, subcortical: Secondary | ICD-10-CM | POA: Diagnosis not present

## 2019-01-14 DIAGNOSIS — I619 Nontraumatic intracerebral hemorrhage, unspecified: Secondary | ICD-10-CM | POA: Diagnosis not present

## 2019-01-14 DIAGNOSIS — D689 Coagulation defect, unspecified: Secondary | ICD-10-CM | POA: Diagnosis not present

## 2019-01-15 DIAGNOSIS — I5030 Unspecified diastolic (congestive) heart failure: Secondary | ICD-10-CM | POA: Diagnosis not present

## 2019-01-15 DIAGNOSIS — I25118 Atherosclerotic heart disease of native coronary artery with other forms of angina pectoris: Secondary | ICD-10-CM | POA: Diagnosis not present

## 2019-01-15 DIAGNOSIS — I619 Nontraumatic intracerebral hemorrhage, unspecified: Secondary | ICD-10-CM | POA: Diagnosis not present

## 2019-01-15 DIAGNOSIS — R270 Ataxia, unspecified: Secondary | ICD-10-CM | POA: Diagnosis not present

## 2019-01-15 DIAGNOSIS — I69218 Other symptoms and signs involving cognitive functions following other nontraumatic intracranial hemorrhage: Secondary | ICD-10-CM | POA: Diagnosis not present

## 2019-01-15 DIAGNOSIS — I252 Old myocardial infarction: Secondary | ICD-10-CM | POA: Diagnosis not present

## 2019-01-15 DIAGNOSIS — R2689 Other abnormalities of gait and mobility: Secondary | ICD-10-CM | POA: Diagnosis not present

## 2019-01-15 DIAGNOSIS — I69298 Other sequelae of other nontraumatic intracranial hemorrhage: Secondary | ICD-10-CM | POA: Diagnosis not present

## 2019-01-15 DIAGNOSIS — D689 Coagulation defect, unspecified: Secondary | ICD-10-CM | POA: Diagnosis not present

## 2019-01-15 DIAGNOSIS — I13 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 failure and stage 1 through stage 4 chronic kidney disease, or unspecified chronic kidney disease: Secondary | ICD-10-CM | POA: Diagnosis not present

## 2019-01-15 DIAGNOSIS — I610 Nontraumatic intracerebral hemorrhage in hemisphere, subcortical: Secondary | ICD-10-CM | POA: Diagnosis not present

## 2019-01-15 DIAGNOSIS — R531 Weakness: Secondary | ICD-10-CM | POA: Diagnosis not present

## 2019-01-18 DIAGNOSIS — I252 Old myocardial infarction: Secondary | ICD-10-CM | POA: Diagnosis not present

## 2019-01-18 DIAGNOSIS — I69218 Other symptoms and signs involving cognitive functions following other nontraumatic intracranial hemorrhage: Secondary | ICD-10-CM | POA: Diagnosis not present

## 2019-01-18 DIAGNOSIS — I69298 Other sequelae of other nontraumatic intracranial hemorrhage: Secondary | ICD-10-CM | POA: Diagnosis not present

## 2019-01-18 DIAGNOSIS — R531 Weakness: Secondary | ICD-10-CM | POA: Diagnosis not present

## 2019-01-18 DIAGNOSIS — R270 Ataxia, unspecified: Secondary | ICD-10-CM | POA: Diagnosis not present

## 2019-01-18 DIAGNOSIS — I5030 Unspecified diastolic (congestive) heart failure: Secondary | ICD-10-CM | POA: Diagnosis not present

## 2019-01-18 DIAGNOSIS — I13 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 failure and stage 1 through stage 4 chronic kidney disease, or unspecified chronic kidney disease: Secondary | ICD-10-CM | POA: Diagnosis not present

## 2019-01-18 DIAGNOSIS — R2689 Other abnormalities of gait and mobility: Secondary | ICD-10-CM | POA: Diagnosis not present

## 2019-01-18 DIAGNOSIS — I25118 Atherosclerotic heart disease of native coronary artery with other forms of angina pectoris: Secondary | ICD-10-CM | POA: Diagnosis not present

## 2019-01-22 DIAGNOSIS — R531 Weakness: Secondary | ICD-10-CM | POA: Diagnosis not present

## 2019-01-22 DIAGNOSIS — I69218 Other symptoms and signs involving cognitive functions following other nontraumatic intracranial hemorrhage: Secondary | ICD-10-CM | POA: Diagnosis not present

## 2019-01-22 DIAGNOSIS — I25118 Atherosclerotic heart disease of native coronary artery with other forms of angina pectoris: Secondary | ICD-10-CM | POA: Diagnosis not present

## 2019-01-22 DIAGNOSIS — I5030 Unspecified diastolic (congestive) heart failure: Secondary | ICD-10-CM | POA: Diagnosis not present

## 2019-01-22 DIAGNOSIS — R2689 Other abnormalities of gait and mobility: Secondary | ICD-10-CM | POA: Diagnosis not present

## 2019-01-22 DIAGNOSIS — I69298 Other sequelae of other nontraumatic intracranial hemorrhage: Secondary | ICD-10-CM | POA: Diagnosis not present

## 2019-01-22 DIAGNOSIS — R270 Ataxia, unspecified: Secondary | ICD-10-CM | POA: Diagnosis not present

## 2019-01-22 DIAGNOSIS — I13 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 failure and stage 1 through stage 4 chronic kidney disease, or unspecified chronic kidney disease: Secondary | ICD-10-CM | POA: Diagnosis not present

## 2019-01-22 DIAGNOSIS — I252 Old myocardial infarction: Secondary | ICD-10-CM | POA: Diagnosis not present

## 2019-01-24 ENCOUNTER — Other Ambulatory Visit: Payer: Self-pay

## 2019-01-24 ENCOUNTER — Other Ambulatory Visit: Payer: Medicare HMO | Admitting: Nurse Practitioner

## 2019-01-24 ENCOUNTER — Encounter: Payer: Self-pay | Admitting: Nurse Practitioner

## 2019-01-24 DIAGNOSIS — J449 Chronic obstructive pulmonary disease, unspecified: Secondary | ICD-10-CM | POA: Diagnosis not present

## 2019-01-24 DIAGNOSIS — I69298 Other sequelae of other nontraumatic intracranial hemorrhage: Secondary | ICD-10-CM | POA: Diagnosis not present

## 2019-01-24 DIAGNOSIS — Z515 Encounter for palliative care: Secondary | ICD-10-CM | POA: Diagnosis not present

## 2019-01-24 DIAGNOSIS — R2689 Other abnormalities of gait and mobility: Secondary | ICD-10-CM | POA: Diagnosis not present

## 2019-01-24 DIAGNOSIS — I25118 Atherosclerotic heart disease of native coronary artery with other forms of angina pectoris: Secondary | ICD-10-CM | POA: Diagnosis not present

## 2019-01-24 DIAGNOSIS — R270 Ataxia, unspecified: Secondary | ICD-10-CM | POA: Diagnosis not present

## 2019-01-24 DIAGNOSIS — I5030 Unspecified diastolic (congestive) heart failure: Secondary | ICD-10-CM | POA: Diagnosis not present

## 2019-01-24 DIAGNOSIS — I252 Old myocardial infarction: Secondary | ICD-10-CM | POA: Diagnosis not present

## 2019-01-24 DIAGNOSIS — R531 Weakness: Secondary | ICD-10-CM | POA: Diagnosis not present

## 2019-01-24 DIAGNOSIS — Z85118 Personal history of other malignant neoplasm of bronchus and lung: Secondary | ICD-10-CM | POA: Diagnosis not present

## 2019-01-24 DIAGNOSIS — I69218 Other symptoms and signs involving cognitive functions following other nontraumatic intracranial hemorrhage: Secondary | ICD-10-CM | POA: Diagnosis not present

## 2019-01-24 DIAGNOSIS — I13 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 failure and stage 1 through stage 4 chronic kidney disease, or unspecified chronic kidney disease: Secondary | ICD-10-CM | POA: Diagnosis not present

## 2019-01-24 NOTE — Progress Notes (Signed)
[facility] Consult Note Telephone: [phone]  Fax: [phone]  PATIENT NAME: Evan [name] DOB: July 09, 1927 MRN: [id number]  PRIMARY CARE PROVIDER:   [name], MD  REFERRING PROVIDER:  [name], MD [address] [facility] [place],  [place] [address] RESPONSIBLE PARTY:   Self; Evan [name] [phone] or [phone]  Due to the COVID-19 crisis, this visit was done via telemedicine from my office and it was initiated and consent by this patient and or family.  RECOMMENDATIONS and PLAN: 1. ACP: DNR; Will put in Epic/Vynca; Mailed blank MOST form and [place] book; wishes to revisit at scheduled next palliative care visit.  2.Generalized weaknesssecondary toright lung 01/2016, history of radiation pneumonitis, pleurisy, COPD, coronary artery disease s/p cabg, diastolic congestive heart failure, atrial fibrillation,myocardialinfarction,pulmonary embolus,history of acute pericarditis, angina chest pain at rest chronic, artery disease, ascvd, descending aortic aneurysm repair with stent, peripheral vascular disease, pulmonary embolus, subdural hematoma  Encourage energy conservation and rest times, continue with therapy as able, continues to improve, now ambulating  3. Palliative care encounter ; Palliative medicine team will continue to support patient, patient's family, and medical team. Visit consisted of counseling and education dealing with the complex and emotionally intense issues of symptom management and palliative care in the setting of serious and potentially life-threatening illness  I spent 40 minutes providing this consultation,  from 2:00pm to 2:40pm. More than 50% of the time in this consultation was spent coordinating communication.   HISTORY OF PRESENT ILLNESS:  Evan [name] is a 85 y.o. 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 I called Evan [name], Evan [name] daughter for scheduled telemedicine telephonic palliative care follow-up visit. Evan [name] in agreement talked about purpose of palliative care visit. We talked about how Evan [name] has been feeling lately. He's been doing very well. He continues to work with therapy. He is now ambulating with a walker. No recent falls. He has had an ER visit for leg pain on the right which is where he's had his stroke, work up with insignificant. He was discharged home with recommendations for pain management for which is effective. We talked about his work with physical therapy which he continues to receive. We talked about family cares for him at home and Evan [name] and her brothers rotate Hands-On care. Evan. [place] is has advanced to be able to feed himself with a good appetite and regular diet. No signs of aspiration while eating. Appetite is good. No weight loss. We talked about recent neurology appointment. We talked about medical goals of care to continue to treat what is treatable. We talked about role of palliative care and plan of  care. Discuss will follow up in two months if needed or sooner should he declined. Evan [name] in agreement. Therapeutic listening and emotional support  provided. Contact information. Questions answered to satisfaction.  Palliative Care was asked to help to continue to address goals of care.   CODE STATUS: DNR  PPS: 50% HOSPICE ELIGIBILITY/DIAGNOSIS: TBD  PAST MEDICAL HISTORY:  Past Medical History:  Diagnosis Date  . Adenocarcinoma of right lung ([place]) 01/06/2016  . Anginal chest pain at rest [facility])    Chronic non, controlled on Lorazepam  . Anxiety   . Arthritis    "all over"   . BPH (benign prostatic hyperplasia)   . CAD (coronary artery disease)   . Chronic lower back pain   . Complication of anesthesia    "problems making water afterwards"  . Depression   . DJD (degenerative joint disease)   . Esophageal ulcer without bleeding    bid ppi indefinitely  . Family history of adverse reaction to anesthesia    "children w/PONV"  . GERD (gastroesophageal reflux disease)   . Headache    "couple/week maybe" (09/04/2015)  . Hemochromatosis    Possible, elevated iron stores, hook-like osteophytes on hand films, normal LFTs  . Hepatitis    "yellow jaundice as a baby"  . History of ASCVD    MULTIVESSEL  . Hyperlipidemia   . Hypertension   . Osteoarthritis   . Pneumonia    "several times; got a little now" (09/04/2015)  . Rheumatoid arthritis ([place])   . Subdural hematoma (HCC)    small after fall 09/2016-plavix held, neurosurgery consulted.  Asymptomatic.    Marland Kitchen 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[place]) 02/2018   on Lovenox lifelong since he failed PO Eliquis    SOCIAL HX:  Social History   Tobacco Use  . Smoking status: Never Smoker  . Smokeless tobacco: Former Systems developer    Types: Chew  . Tobacco comment: "quit chewing in the 1960s"  Substance Use Topics  . Alcohol use: No    ALLERGIES:  Allergies  Allergen Reactions  . Feldene [Piroxicam] Other (See Comments)    REACTION: Blisters  . Statins  Other (See Comments)    Myalgias  . Doxycycline Other (See Comments)    REACTION:  unknown  . Latex Rash  . Tequin Anxiety  . Valium Other (See Comments)    REACTION:  unknown     PERTINENT MEDICATIONS:  Outpatient Encounter Medications as of 01/24/2019  Medication Sig  . acetaminophen (TYLENOL) 325 MG tablet Take 1-2 tablets (325-650 mg total) by mouth every 4 (four) hours as needed for mild pain.  Marland Kitchen albuterol (VENTOLIN HFA) 108 (90 Base) MCG/ACT inhaler Inhale 2 puffs into the lungs every 4 (four) hours as needed for up to 14 days for wheezing or shortness of breath.  . ALPRAZolam (XANAX) 0.5 MG tablet Take 0.5 tablets (0.25 mg total) by mouth 3 (three) times daily as needed for anxiety.  . camphor-menthol (SARNA) lotion Apply 1 application topically daily as needed for itching.  . diclofenac sodium (VOLTAREN) 1 % GEL Apply 2 g topically 4 (four) times daily.  . furosemide (LASIX) 20 MG tablet Take 1 tablet (20 mg total) by mouth daily.  Marland Kitchen gabapentin (NEURONTIN) 100 MG capsule Take 1 capsule (100 mg total) by mouth 3 (three) times daily.  Marland Kitchen Lifitegrast (XIIDRA) 5 % SOLN Place 1-2 drops into both eyes daily.   . magnesium oxide (MAG-OX) 400 (241.3 Mg) MG tablet Take 1 tablet (400 mg total) by mouth 2 (two) times daily.  . metoprolol tartrate (LOPRESSOR) 25 MG tablet Take 0.5 tablets (12.5 mg total) by mouth 2 (two) times daily.  Marland Kitchen neomycin-polymyxin-hydrocortisone (  CORTISPORIN) OTIC solution Place 4 drops into both ears 4 (four) times daily.  . nitroGLYCERIN (NITROSTAT) 0.4 MG SL tablet Place 1 tablet (0.4 mg total) under the tongue every 5 (five) minutes as needed for chest pain.  . polyethylene glycol (MIRALAX / GLYCOLAX) 17 g packet Take 17 g by mouth daily as needed for mild constipation.  . Rivaroxaban (XARELTO) 15 MG TABS tablet Take 1 tablet (15 mg total) by mouth daily with supper.  . rosuvastatin (CRESTOR) 5 MG tablet Take 1 tablet (5 mg total) by mouth daily at 6 PM.  .  tamsulosin (FLOMAX) 0.4 MG CAPS capsule Take 2 capsules (0.8 mg total) by mouth daily after supper.   No facility-administered encounter medications on file as of 01/24/2019.     PHYSICAL EXAM:   Deferred  Christin Z Gusler, NP

## 2019-01-30 DIAGNOSIS — I69218 Other symptoms and signs involving cognitive functions following other nontraumatic intracranial hemorrhage: Secondary | ICD-10-CM | POA: Diagnosis not present

## 2019-01-30 DIAGNOSIS — I5030 Unspecified diastolic (congestive) heart failure: Secondary | ICD-10-CM | POA: Diagnosis not present

## 2019-01-30 DIAGNOSIS — I252 Old myocardial infarction: Secondary | ICD-10-CM | POA: Diagnosis not present

## 2019-01-30 DIAGNOSIS — I13 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 failure and stage 1 through stage 4 chronic kidney disease, or unspecified chronic kidney disease: Secondary | ICD-10-CM | POA: Diagnosis not present

## 2019-01-30 DIAGNOSIS — I69298 Other sequelae of other nontraumatic intracranial hemorrhage: Secondary | ICD-10-CM | POA: Diagnosis not present

## 2019-01-30 DIAGNOSIS — R531 Weakness: Secondary | ICD-10-CM | POA: Diagnosis not present

## 2019-01-30 DIAGNOSIS — R2689 Other abnormalities of gait and mobility: Secondary | ICD-10-CM | POA: Diagnosis not present

## 2019-01-30 DIAGNOSIS — R270 Ataxia, unspecified: Secondary | ICD-10-CM | POA: Diagnosis not present

## 2019-01-30 DIAGNOSIS — I25118 Atherosclerotic heart disease of native coronary artery with other forms of angina pectoris: Secondary | ICD-10-CM | POA: Diagnosis not present

## 2019-02-01 DIAGNOSIS — I13 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 failure and stage 1 through stage 4 chronic kidney disease, or unspecified chronic kidney disease: Secondary | ICD-10-CM | POA: Diagnosis not present

## 2019-02-01 DIAGNOSIS — R531 Weakness: Secondary | ICD-10-CM | POA: Diagnosis not present

## 2019-02-01 DIAGNOSIS — I25118 Atherosclerotic heart disease of native coronary artery with other forms of angina pectoris: Secondary | ICD-10-CM | POA: Diagnosis not present

## 2019-02-01 DIAGNOSIS — I69218 Other symptoms and signs involving cognitive functions following other nontraumatic intracranial hemorrhage: Secondary | ICD-10-CM | POA: Diagnosis not present

## 2019-02-01 DIAGNOSIS — R270 Ataxia, unspecified: Secondary | ICD-10-CM | POA: Diagnosis not present

## 2019-02-01 DIAGNOSIS — R2689 Other abnormalities of gait and mobility: Secondary | ICD-10-CM | POA: Diagnosis not present

## 2019-02-01 DIAGNOSIS — I252 Old myocardial infarction: Secondary | ICD-10-CM | POA: Diagnosis not present

## 2019-02-01 DIAGNOSIS — I5030 Unspecified diastolic (congestive) heart failure: Secondary | ICD-10-CM | POA: Diagnosis not present

## 2019-02-01 DIAGNOSIS — I69298 Other sequelae of other nontraumatic intracranial hemorrhage: Secondary | ICD-10-CM | POA: Diagnosis not present

## 2019-02-04 DIAGNOSIS — R531 Weakness: Secondary | ICD-10-CM | POA: Diagnosis not present

## 2019-02-04 DIAGNOSIS — I252 Old myocardial infarction: Secondary | ICD-10-CM | POA: Diagnosis not present

## 2019-02-04 DIAGNOSIS — R270 Ataxia, unspecified: Secondary | ICD-10-CM | POA: Diagnosis not present

## 2019-02-04 DIAGNOSIS — I69298 Other sequelae of other nontraumatic intracranial hemorrhage: Secondary | ICD-10-CM | POA: Diagnosis not present

## 2019-02-04 DIAGNOSIS — I5030 Unspecified diastolic (congestive) heart failure: Secondary | ICD-10-CM | POA: Diagnosis not present

## 2019-02-04 DIAGNOSIS — I69218 Other symptoms and signs involving cognitive functions following other nontraumatic intracranial hemorrhage: Secondary | ICD-10-CM | POA: Diagnosis not present

## 2019-02-04 DIAGNOSIS — R2689 Other abnormalities of gait and mobility: Secondary | ICD-10-CM | POA: Diagnosis not present

## 2019-02-04 DIAGNOSIS — I13 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 failure and stage 1 through stage 4 chronic kidney disease, or unspecified chronic kidney disease: Secondary | ICD-10-CM | POA: Diagnosis not present

## 2019-02-04 DIAGNOSIS — I25118 Atherosclerotic heart disease of native coronary artery with other forms of angina pectoris: Secondary | ICD-10-CM | POA: Diagnosis not present

## 2019-02-07 DIAGNOSIS — I5030 Unspecified diastolic (congestive) heart failure: Secondary | ICD-10-CM | POA: Diagnosis not present

## 2019-02-07 DIAGNOSIS — I252 Old myocardial infarction: Secondary | ICD-10-CM | POA: Diagnosis not present

## 2019-02-07 DIAGNOSIS — I13 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 failure and stage 1 through stage 4 chronic kidney disease, or unspecified chronic kidney disease: Secondary | ICD-10-CM | POA: Diagnosis not present

## 2019-02-07 DIAGNOSIS — I69218 Other symptoms and signs involving cognitive functions following other nontraumatic intracranial hemorrhage: Secondary | ICD-10-CM | POA: Diagnosis not present

## 2019-02-07 DIAGNOSIS — R531 Weakness: Secondary | ICD-10-CM | POA: Diagnosis not present

## 2019-02-07 DIAGNOSIS — R2689 Other abnormalities of gait and mobility: Secondary | ICD-10-CM | POA: Diagnosis not present

## 2019-02-07 DIAGNOSIS — I69298 Other sequelae of other nontraumatic intracranial hemorrhage: Secondary | ICD-10-CM | POA: Diagnosis not present

## 2019-02-07 DIAGNOSIS — I25118 Atherosclerotic heart disease of native coronary artery with other forms of angina pectoris: Secondary | ICD-10-CM | POA: Diagnosis not present

## 2019-02-07 DIAGNOSIS — R270 Ataxia, unspecified: Secondary | ICD-10-CM | POA: Diagnosis not present

## 2019-02-08 DIAGNOSIS — L299 Pruritus, unspecified: Secondary | ICD-10-CM | POA: Diagnosis not present

## 2019-02-08 DIAGNOSIS — L2084 Intrinsic (allergic) eczema: Secondary | ICD-10-CM | POA: Diagnosis not present

## 2019-02-08 DIAGNOSIS — L280 Lichen simplex chronicus: Secondary | ICD-10-CM | POA: Diagnosis not present

## 2019-02-08 DIAGNOSIS — L249 Irritant contact dermatitis, unspecified cause: Secondary | ICD-10-CM | POA: Diagnosis not present

## 2019-02-08 DIAGNOSIS — B372 Candidiasis of skin and nail: Secondary | ICD-10-CM | POA: Diagnosis not present

## 2019-02-13 DIAGNOSIS — I69218 Other symptoms and signs involving cognitive functions following other nontraumatic intracranial hemorrhage: Secondary | ICD-10-CM | POA: Diagnosis not present

## 2019-02-13 DIAGNOSIS — R2689 Other abnormalities of gait and mobility: Secondary | ICD-10-CM | POA: Diagnosis not present

## 2019-02-13 DIAGNOSIS — I13 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 failure and stage 1 through stage 4 chronic kidney disease, or unspecified chronic kidney disease: Secondary | ICD-10-CM | POA: Diagnosis not present

## 2019-02-13 DIAGNOSIS — I69298 Other sequelae of other nontraumatic intracranial hemorrhage: Secondary | ICD-10-CM | POA: Diagnosis not present

## 2019-02-13 DIAGNOSIS — R531 Weakness: Secondary | ICD-10-CM | POA: Diagnosis not present

## 2019-02-13 DIAGNOSIS — I69293 Ataxia following other nontraumatic intracranial hemorrhage: Secondary | ICD-10-CM | POA: Diagnosis not present

## 2019-02-13 DIAGNOSIS — I5030 Unspecified diastolic (congestive) heart failure: Secondary | ICD-10-CM | POA: Diagnosis not present

## 2019-02-13 DIAGNOSIS — I25118 Atherosclerotic heart disease of native coronary artery with other forms of angina pectoris: Secondary | ICD-10-CM | POA: Diagnosis not present

## 2019-02-13 DIAGNOSIS — I252 Old myocardial infarction: Secondary | ICD-10-CM | POA: Diagnosis not present

## 2019-02-14 DIAGNOSIS — D689 Coagulation defect, unspecified: Secondary | ICD-10-CM | POA: Diagnosis not present

## 2019-02-14 DIAGNOSIS — I610 Nontraumatic intracerebral hemorrhage in hemisphere, subcortical: Secondary | ICD-10-CM | POA: Diagnosis not present

## 2019-02-14 DIAGNOSIS — N403 Nodular prostate with lower urinary tract symptoms: Secondary | ICD-10-CM | POA: Diagnosis not present

## 2019-02-14 DIAGNOSIS — Z87440 Personal history of urinary (tract) infections: Secondary | ICD-10-CM | POA: Diagnosis not present

## 2019-02-14 DIAGNOSIS — R351 Nocturia: Secondary | ICD-10-CM | POA: Diagnosis not present

## 2019-02-14 DIAGNOSIS — I619 Nontraumatic intracerebral hemorrhage, unspecified: Secondary | ICD-10-CM | POA: Diagnosis not present

## 2019-02-14 DIAGNOSIS — R3914 Feeling of incomplete bladder emptying: Secondary | ICD-10-CM | POA: Diagnosis not present

## 2019-02-15 ENCOUNTER — Ambulatory Visit
Admission: EM | Admit: 2019-02-15 | Discharge: 2019-02-15 | Disposition: A | Discharge status: Home / Self Care | Payer: Medicare HMO

## 2019-02-15 ENCOUNTER — Other Ambulatory Visit: Payer: Self-pay

## 2019-02-15 DIAGNOSIS — I610 Nontraumatic intracerebral hemorrhage in hemisphere, subcortical: Secondary | ICD-10-CM | POA: Diagnosis not present

## 2019-02-15 DIAGNOSIS — I252 Old myocardial infarction: Secondary | ICD-10-CM | POA: Diagnosis not present

## 2019-02-15 DIAGNOSIS — R2689 Other abnormalities of gait and mobility: Secondary | ICD-10-CM | POA: Diagnosis not present

## 2019-02-15 DIAGNOSIS — D689 Coagulation defect, unspecified: Secondary | ICD-10-CM | POA: Diagnosis not present

## 2019-02-15 DIAGNOSIS — I69298 Other sequelae of other nontraumatic intracranial hemorrhage: Secondary | ICD-10-CM | POA: Diagnosis not present

## 2019-02-15 DIAGNOSIS — L03115 Cellulitis of right lower limb: Secondary | ICD-10-CM

## 2019-02-15 DIAGNOSIS — R531 Weakness: Secondary | ICD-10-CM | POA: Diagnosis not present

## 2019-02-15 DIAGNOSIS — I5030 Unspecified diastolic (congestive) heart failure: Secondary | ICD-10-CM | POA: Diagnosis not present

## 2019-02-15 DIAGNOSIS — I25118 Atherosclerotic heart disease of native coronary artery with other forms of angina pectoris: Secondary | ICD-10-CM | POA: Diagnosis not present

## 2019-02-15 DIAGNOSIS — I13 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 failure and stage 1 through stage 4 chronic kidney disease, or unspecified chronic kidney disease: Secondary | ICD-10-CM | POA: Diagnosis not present

## 2019-02-15 DIAGNOSIS — I619 Nontraumatic intracerebral hemorrhage, unspecified: Secondary | ICD-10-CM | POA: Diagnosis not present

## 2019-02-15 DIAGNOSIS — I69218 Other symptoms and signs involving cognitive functions following other nontraumatic intracranial hemorrhage: Secondary | ICD-10-CM | POA: Diagnosis not present

## 2019-02-15 DIAGNOSIS — I69293 Ataxia following other nontraumatic intracranial hemorrhage: Secondary | ICD-10-CM | POA: Diagnosis not present

## 2019-02-15 MED ORDER — CEPHALEXIN 500 MG PO CAPS: 500.0000 mg | ORAL_CAPSULE | Freq: Four times a day (QID) | ORAL | 0 refills | Status: AC

## 2019-02-15 NOTE — ED Triage Notes (Signed)
Pt presents to UC w/ c/o right ankle pain and swelling x2 days. Pt is weak on right side from previous stroke. Pt states he has hx of gout in other foot.

## 2019-02-15 NOTE — ED Provider Notes (Signed)
[facility]    CSN: [id number] Arrival date & time: 02/15/19  1110      History   Chief Complaint Chief Complaint  Patient presents with  . right ankle pain/swelling    HPI Evan [name] is a 85 y.o. male adenocarcinoma of the lung, CAD, GERD, previous DVTs, CVA, CKD, presenting today for evaluation of right ankle pain and swelling.  Over the past couple days patient has had increased pain swelling and redness to his right ankle.  Has pain with any movement of his right ankle.  Denies any specific injury or fall.  Patient did have a hemorrhagic stroke in April of this year and has residual right-sided weakness and decreased sensation.  His wife notes that he often is ambulating on the lateral aspect of his foot.  Has had some ankle discomfort off and on over the past few months, but over the past couple days has significantly worsened.  Denies any fevers.  Denies pain extending into calf/lower leg.  Is on Xarelto from previous DVTs.  Has been taking Tylenol for pain.  HPI  Past Medical History:  Diagnosis Date  . Adenocarcinoma of right lung ([place]) 01/06/2016  . Anginal chest pain at rest [facility])    Chronic non, controlled on Lorazepam  . Anxiety   . Arthritis    "all over"   . BPH (benign prostatic hyperplasia)   . CAD (coronary artery disease)   . Chronic lower back pain   . Complication of anesthesia    "problems making water afterwards"  . Depression   . DJD (degenerative joint disease)   . Esophageal ulcer without bleeding    bid ppi indefinitely  . Family history of adverse reaction to anesthesia    "children w/PONV"  . GERD (gastroesophageal reflux disease)   . Headache    "couple/week maybe" (09/04/2015)  . Hemochromatosis    Possible, elevated iron stores, hook-like osteophytes on hand films, normal LFTs  . Hepatitis    "yellow jaundice as a baby"  . History of ASCVD    MULTIVESSEL  . Hyperlipidemia   . Hypertension   . Osteoarthritis   . Pneumonia     "several times; got a little now" (09/04/2015)  . Rheumatoid arthritis ([place])   . Subdural hematoma (H[facility])    small after fall 09/2016-plavix held, neurosurgery consulted.  Asymptomatic.    Marland Kitchen 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[place]) 02/2018   on Lovenox lifelong since he failed PO Eliquis    Patient Active Problem List   Diagnosis Date Noted  . Chronic right shoulder pain 12/19/2018  . Weakness generalized 09/28/2018  . Palliative care encounter 09/28/2018  . Pressure injury of skin 08/27/2018  . COPD (chronic obstructive pulmonary disease) ([place]) 08/26/2018  . AMS (altered mental status) 08/22/2018  . Altered mental status 08/21/2018  . UTI (urinary tract infection) 08/21/2018  . Abnormality of gait 08/20/2018  . Prolonged Q-T interval on ECG   . Dysphagia, post-stroke   . Supplemental oxygen dependent   . Anxiety state   . Fatigue   . SOB (shortness of breath)   . Hyperglycemia   . History of lung cancer   . Depression   . CKD (chronic kidney disease), stage III   . Acute blood loss anemia   . Fall 07/16/2018  . 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 Acute spontaneous 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 ([place]) L thalamic 07/14/2018  . Cellulitis of arm, right 06/01/2018  . Rash  and nonspecific skin eruption 06/01/2018  . Chest pain 02/02/2018  . Thrombocytopenia ([place]) 02/02/2018  . Hypokalemia 02/02/2018  . Pulmonary embolism ([place]) 02/02/2018  . Elevated troponin 02/02/2018  . Pulmonary embolus ([place]) 02/02/2018  . Non-ST elevation MI (NSTEMI) ([place]) 06/27/2017  . Diastolic CHF ([place]) 37/85/8850  . Chest pain due to CAD ([place]) 06/26/2017  . Dyspnea 06/21/2017  . Acute bronchitis 05/15/2017  . Oral candidiasis 05/15/2017  . Acute pericarditis   . Chest pain at rest 12/20/2016  . Angina pectoris ([place]) 12/20/2016  . Radiation pneumonitis ([place]) 11/09/2016  . Pleurisy 10/11/2016  . Chest wall pain 10/11/2016  . Atrial  fibrillation ([place]) 05/21/2016  . Adenocarcinoma of right lung ([place]) 01/06/2016  . GAD (generalized anxiety disorder) 11/20/2015  . Essential hypertension   . Cellulitis 09/04/2015  . Cellulitis of left axilla   . Esophageal ulcer without bleeding   . Bilateral lower extremity edema 04/28/2014  . BPH (benign prostatic hyperplasia) 10/08/2012  . Anxiety   . EKG abnormalities 09/05/2012  . Tremor 09/05/2012  . Chronic fatigue 09/05/2012  . Arthritis   . Asthma, chronic   . Reflux esophagitis   . S/P CABG (coronary artery bypass graft)   . GERD (gastroesophageal reflux disease)   . Hyperlipidemia   . Hypertension   . UNSPECIFIED PERIPHERAL VASCULAR DISEASE 06/16/2009    Past Surgical History:  Procedure Laterality Date  . ARM SKIN LESION BIOPSY / EXCISION Left 09/02/2015  . CATARACT EXTRACTION W/ INTRAOCULAR LENS  IMPLANT, BILATERAL Bilateral   . CHOLECYSTECTOMY OPEN    . CORNEAL TRANSPLANT Bilateral    "one at [facility]; one at [facility]"  . CORONARY ANGIOPLASTY WITH STENT PLACEMENT    . CORONARY ARTERY BYPASS GRAFT  1999  . DESCENDING AORTIC ANEURYSM REPAIR W/ STENT     DE stent ostium into the right radial free graft at OM-, 02-2007  . ESOPHAGOGASTRODUODENOSCOPY N/A 11/09/2014   Procedure: ESOPHAGOGASTRODUODENOSCOPY (EGD);  Surgeon: [name], MD;  [facility] END[facility];  Service: Endoscopy;  Laterality: N/A;  . INGUINAL HERNIA REPAIR    . JOINT REPLACEMENT    . LEFT HEART CATH AND CORS/GRAFTS ANGIOGRAPHY N/A 06/27/2017   Procedure: LEFT HEART CATH AND CORS/GRAFTS ANGIOGRAPHY;  Surgeon: [name], MD;  Location: [facility] CV LAB;  Service: Cardiovascular;  Laterality: N/A;  . NASAL SINUS SURGERY    . SHOULDER OPEN ROTATOR CUFF REPAIR Right   . TOTAL KNEE ARTHROPLASTY Bilateral        Home Medications    Prior to Admission medications   Medication Sig Start Date End Date Taking? Authorizing Provider  fluconazole (DIFLUCAN) 100 MG tablet Take 100 mg by mouth daily.   Yes  [provider]  FLUoxetine (PROZAC) 20 MG capsule Take 20 mg by mouth daily.   Yes [provider]  acetaminophen (TYLENOL) 325 MG tablet Take 1-2 tablets (325-650 mg total) by mouth every 4 (four) hours as needed for mild pain. 08/09/18   Love, Ivan Anchors, PA-C  albuterol (VENTOLIN HFA) 108 (90 Base) MCG/ACT inhaler Inhale 2 puffs into the lungs every 4 (four) hours as needed for up to 14 days for wheezing or shortness of breath. 11/05/18 12/26/18  Noemi Chapel, MD  ALPRAZolam Duanne Moron) 0.5 MG tablet Take 0.5 tablets (0.25 mg total) by mouth 3 (three) times daily as needed for anxiety. 08/31/18   [name], MD  camphor-menthol Timoteo Ace) lotion Apply 1 application topically daily as needed for itching. 08/24/18   Aline August, MD  cephALEXin (KEFLEX) 500 MG capsule Take 1 capsule (500 mg total) by mouth 4 (four) times daily for 7 days. 02/15/19 02/22/19  Wieters, Hallie C, PA-C  diclofenac sodium (VOLTAREN) 1 % GEL Apply 2 g topically 4 (four) times daily. 12/19/18   Jamse Arn, MD  furosemide (LASIX) 20 MG tablet Take 1 tablet (20 mg total) by mouth daily. 08/14/18   Angiulli, Lavon Paganini, PA-C  gabapentin (NEURONTIN) 100 MG capsule Take 1 capsule (100 mg total) by mouth 3 (three) times daily. 01/04/19   [name], MD  Lifitegrast Shirley Friar) 5 % SOLN Place 1-2 drops into both eyes daily.     [provider]  magnesium oxide (MAG-OX) 400 (241.3 Mg) MG tablet Take 1 tablet (400 mg total) by mouth 2 (two) times daily. 08/14/18   Angiulli, Lavon Paganini, PA-C  metoprolol tartrate (LOPRESSOR) 25 MG tablet Take 0.5 tablets (12.5 mg total) by mouth 2 (two) times daily. 10/04/18   [name], MD  neomycin-polymyxin-hydrocortisone (CORTISPORIN) OTIC solution Place 4 drops into both ears 4 (four) times daily. 01/04/19   [name], MD  nitroGLYCERIN (NITROSTAT) 0.4 MG SL tablet Place 1 tablet (0.4 mg total) under the tongue every 5 (five) minutes as needed for chest pain.  12/06/17   Daune Perch, NP  polyethylene glycol (MIRALAX / GLYCOLAX) 17 g packet Take 17 g by mouth daily as needed for mild constipation. 08/14/18   Angiulli, Lavon Paganini, PA-C  Rivaroxaban (XARELTO) 15 MG TABS tablet Take 1 tablet (15 mg total) by mouth daily with supper. 12/27/18   Jettie Booze, MD  rosuvastatin (CRESTOR) 5 MG tablet Take 1 tablet (5 mg total) by mouth daily at 6 PM. 08/14/18   Angiulli, Lavon Paganini, PA-C  tamsulosin (FLOMAX) 0.4 MG CAPS capsule Take 2 capsules (0.8 mg total) by mouth daily after supper. 11/27/18   [name], MD    Family History Family History  Problem Relation Age of Onset  . Arthritis-Osteo Sister   . Heart attack Brother   . Heart attack Other   . Cancer Neg Hx     Social History Social History   Tobacco Use  . Smoking status: Never Smoker  . Smokeless tobacco: Former Systems developer    Types: Chew  . Tobacco comment: "quit chewing in the 1960s"  Substance Use Topics  . Alcohol use: No  . Drug use: No     Allergies   Feldene [piroxicam], Statins, Doxycycline, Latex, Tequin, and Valium   Review of Systems Review of Systems  Constitutional: Negative for fatigue and fever.  Eyes: Negative for redness, itching and visual disturbance.  Respiratory: Negative for shortness of breath.   Cardiovascular: Negative for chest pain and leg swelling.  Gastrointestinal: Negative for nausea and vomiting.  Musculoskeletal: Positive for arthralgias and joint swelling. Negative for myalgias.  Skin: Positive for color change. Negative for rash and wound.  Neurological: Negative for dizziness, syncope, weakness, light-headedness and headaches.     Physical Exam Triage Vital Signs ED Triage Vitals  Enc Vitals Group     BP 02/15/19 1147 101/64     Pulse Rate 02/15/19 1147 69     Resp 02/15/19 1147 16     Temp 02/15/19 1147 98 F (36.7 C)     Temp Source 02/15/19 1147 Oral     SpO2 02/15/19 1147 96 %     Weight --      Height --      Head  Circumference --  Peak Flow --      Pain Score 02/15/19 1142 10     Pain Loc --      Pain Edu? --      Excl. in [place]? --    No data found.  Updated Vital Signs BP 101/64 (BP Location: Left Arm)   Pulse 69   Temp 98 F (36.7 C) (Oral)   Resp 16   SpO2 96%   Visual Acuity Right Eye Distance:   Left Eye Distance:   Bilateral Distance:    Right Eye Near:   Left Eye Near:    Bilateral Near:     Physical Exam Vitals signs and nursing note reviewed.  Constitutional:      Appearance: He is well-developed.     Comments: No acute distress  HENT:     Head: Normocephalic and atraumatic.     Nose: Nose normal.  Eyes:     Conjunctiva/sclera: Conjunctivae normal.  Neck:     Musculoskeletal: Neck supple.  Cardiovascular:     Rate and Rhythm: Normal rate.  Pulmonary:     Effort: Pulmonary effort is normal. No respiratory distress.  Abdominal:     General: There is no distension.  Musculoskeletal: Normal range of motion.     Comments: Right ankle: Moderate swelling and erythema noted to lateral malleolus, tenderness to palpation  No calf tenderness erythema or swelling of right lower leg  Dorsalis pedis faint but palpable  Skin:    General: Skin is warm and dry.     Comments: Erythema noted to lateral malleolus of right ankle, central area with a scabbing wound with surrounding skin peeling, erythema surrounding this circumferentially, moderately warmth to touch  Neurological:     Mental Status: He is alert and oriented to person, place, and time.        UC Treatments / Results  Labs (all labs ordered are listed, but only abnormal results are displayed) Labs Reviewed - No data to display  EKG   Radiology No results found.  Procedures Procedures (including critical care time)  Medications Ordered in UC Medications - No data to display  Initial Impression / Assessment and Plan / UC Course  I have reviewed the triage vital signs and the nursing notes.   Pertinent labs & imaging results that were available during my care of the patient were reviewed by me and considered in my medical decision making (see chart for details).     Exam is suggestive of cellulitis as cause of ankle pain and swelling given the central wound.  Does have history of gout, but feel this is less likely, will have patient continue Tylenol for pain.  Continue to ice and elevate.  Placing on Keflex x1 week, follow-up if redness swelling and pain not improving with the above.  May consider course of prednisone for treatment of gout if symptoms not responding as would expected with treatment for cellulitis.  Do not suspect DVT at this time, on Xarelto.  Discussed strict return precautions. Patient verbalized understanding and is agreeable with plan.  Final Clinical Impressions(s) / UC Diagnoses   Final diagnoses:  Cellulitis of right ankle     Discharge Instructions     Keflex 4 times a day for 1 week Tylneol for pain Ice and elevate foot to help with swelling and circulation  Follow up if not seeing improvement with antibiotic or pain persisting   ED Prescriptions    Medication Sig Dispense Auth. Provider   cephALEXin (KEFLEX) 500  MG capsule Take 1 capsule (500 mg total) by mouth 4 (four) times daily for 7 days. 28 capsule Wieters, Hatley C, PA-C     PDMP not reviewed this encounter.   Janith Lima, [place] 02/15/19 1657

## 2019-02-15 NOTE — Discharge Instructions (Signed)
Keflex 4 times a day for 1 week Tylneol for pain Ice and elevate foot to help with swelling and circulation  Follow up if not seeing improvement with antibiotic or pain persisting

## 2019-02-18 ENCOUNTER — Ambulatory Visit (INDEPENDENT_AMBULATORY_CARE_PROVIDER_SITE_OTHER): Payer: Medicare HMO | Admitting: Adult Health

## 2019-02-18 ENCOUNTER — Other Ambulatory Visit: Payer: Self-pay

## 2019-02-18 ENCOUNTER — Encounter: Payer: Self-pay | Admitting: Adult Health

## 2019-02-18 DIAGNOSIS — I2699 Other pulmonary embolism without acute cor pulmonale: Secondary | ICD-10-CM | POA: Diagnosis not present

## 2019-02-18 DIAGNOSIS — C3491 Malignant neoplasm of unspecified part of right bronchus or lung: Secondary | ICD-10-CM | POA: Diagnosis not present

## 2019-02-18 NOTE — Progress Notes (Signed)
PCCM:  Thanks Garner Nash, DO [facility] 02/18/2019 2:07 PM

## 2019-02-18 NOTE — Patient Instructions (Signed)
May use Duoneb As needed   Mucinex DM twice daily as needed for cough and congestion Continue on current regimen .  Continue with PT at home .  Follow-up with Dr. Valeta Harms  in 4-6 months and As needed   Please contact office for sooner follow up if symptoms do not improve or worsen or seek emergency care ]

## 2019-02-18 NOTE — Assessment & Plan Note (Addendum)
Diagnosed November 2019 presumed to be a Eliquis failure as he was taking this for A. fib with reported compliance.  Patient was placed on Lovenox unfortunately had a intracranial hemorrhage April 2020 and taken off of Lovenox.  Currently on Xarelto with no reportable bleeding issues.  Follow-up CT chest with PE protocol May 2020 was negative for PE.  Patient did have some associated pulmonary hypertension on echo previously.  This seems to have improved and is currently stable with only mildly elevated levels.  We will continue on the current regimen and monitor extremely closely for bleeding issues. Patient is on Xarelto for A. Fib.  Is high risk for bleeding and complications.

## 2019-02-18 NOTE — Progress Notes (Signed)
@Patient  ID: Evan [name], male    DOB: [birth year], 85 y.o.   MRN: [id number]  Chief Complaint  Patient presents with  . Follow-up    Referring provider: [name], MD  HPI: 85 year old followed for history of PE (dx 02/2018)  with secondary pulmonary hypertension, lung cancer-adenocarcinoma the right lung September 2017 status post SBRT and suspected radiation pneumonitis Medical history significant for rheumatoid arthritis, coronary artery disease status post CABG and pericarditis in 2018, A. fib  TEST/EVENTS :  01/05/16:FVC 3.26 L (111%) FEV1 2.61 L (131%) FEV1/FVC 0.80 FEF 25-75 2.60 L (214%) negative bronchodilator response TLC 5.47 L (90%) RV 86% ERV 401% DLCO corrected 46% 2018Spirometry today show some mild restriction with FVC at 70%, FEV1 is 87%, ratio 85%  WALK TEST 10/11/16: Walked 1lap / Baseline Sat 100% on RA / Nadir Sat 100% on RA (stopped due to feeling his legs were weak)  IMAGING CT CHEST W/ CONTRAST 10/04/16 (personally reviewed by me):No pathologic mediastinal adenopathy. No pericardial effusion. Improvement in right basilar density compared with CT imaging performed in February. There is also decreased subpleural patchy groundglass opacities as well. No new or developing areas of opacification or groundglass. Questionable subpleural reticulation, primarily on the right. No obvious honeycombing or bronchiectasis appreciated.  PET CT 12/22/15 (per radiologist):  IMPRESSION: 1. Hypermetabolic (max SUV 3.9) posterior right lower lobe predominantly solid 2.9 cm pulmonary nodule, which is increased in size back to 2013, most consistent with primary bronchogenic adenocarcinoma. 2. Patchy subpleural reticulation throughout both lungs, basilar predominant, suspicious for interstitial lung disease, not well characterized on this study. Consider correlation with high-resolution chest CT in 6-12 months to assess temporal pattern stability, as clinically warranted.  3. Hypermetabolic bilateral hilar and mediastinal lymphadenopathy is highly nonspecific given the suspected underlying interstitial lung disease, and could be reactive or metastatic. 4. Nonspecific hypermetabolic right level 2 neck lymph node, favor reactive node, cannot exclude metastasis. 5. No hypermetabolic osseous or abdominopelvic metastatic disease.  6. Mild hypermetabolism associated with subcutaneous fat stranding in the lateral right hip soft tissues, probably benign, correlate with any history of recent trauma or procedure in this location. 7. Relatively symmetric hypermetabolism in the bilateral glenohumeral joints with associated osteoarthritis, favor degenerative uptake. 8. Additional findings include mild cardiomegaly, aortic atherosclerosis, coronary atherosclerosis status post CABG and moderate sigmoid diverticulosis.  CARDIAC TTE (05/23/16):The normal in size with moderate concentric hypertrophy. EF 41-66%AYTK normal diastolic function. LA moderately to severely dilated with RA normal in size. RV normal in size and function. Mild aortic stenosis with moderately calcified leaflets. Mild mitral regurgitation. Trivial pulmonic regurgitation. Mild tricuspid regurgitation.  PATHOLOGY CT-GUIDED BIOPSY RIGHT LOWER LOBE MASS (12/30/15):Adenocarcinoma  02/18/2019 Follow up : PE , Lung Cancer  Patient returns for a follow-up visit.  Last seen in February 2020.  Patient has a history of A. fib was previously on Eliquis.  Patient was diagnosed with a PE and DVT in November 2019.  Felt to be an Eliquis failure and started on Lovenox.  Patient says over the last year he has had multiple hospitalization. In April 2020 admitted for intracranial hemorrhage now off of anticoagulation.  Has right-sided weakness deficits..  May 2020 admitted with acute encephalopathy attributed to E. coli urinary tract infection. CT chest with PE protocol Aug 26, 2018 -negative for PE.  Right pleural effusion and  right basilar airspace disease Follow-up 2D echo August 2020 showed preserved EF with 6065%.  RV mildly enlarged.  RVSP 37 mmHg.  Moderate to  severely dilated left and right atrium mild to moderate tricuspid regurg  As above patient has a history of lung cancer status post SBRT.  He is followed by serial CT chest. CT chest December 07, 2018 showed subpleural masslike architectural distortion in the posterior medial right base in the area of previous tumor appears to be similar to previous exam with no evidence of disease progression or metastasis.   Patient says he has DuoNeb at home but does not feel like he needs it.  Also has oxygen but is not using it currently.  He is doing physical therapy at home.  Says overall breathing is doing okay.  He has no increased shortness of breath.  Activity tolerance is low.  He is limited due to his right-sided weakness.  Uses a walker and is wheelchair-bound. Denies any cough or wheezing. He is currently on Xarelto.  Denies any known bleeding.  Has been having trouble with right ankle pain.  Was seen in urgent care.  Has an appointment with his primary care physician tomorrow.  Allergies  Allergen Reactions  . Feldene [Piroxicam] Other (See Comments)    REACTION: Blisters  . Statins Other (See Comments)    Myalgias  . Doxycycline Other (See Comments)    REACTION:  unknown  . Latex Rash  . Tequin Anxiety  . Valium Other (See Comments)    REACTION:  unknown    Immunization History  Administered Date(s) Administered  . Fluad Quad(high Dose 65+) 01/04/2019  . Influenza Whole 01/26/2007, 01/10/2008, 12/30/2009  . Influenza, High Dose Seasonal PF 12/23/2016, 02/09/2018  . Influenza,inj,Quad PF,6+ Mos 12/18/2012, 01/16/2015, 12/29/2015  . Pneumococcal Conjugate-13 06/24/2014  . Pneumococcal Polysaccharide-23 01/25/2000  . Td 12/27/2000, 03/09/2010  . Tdap 03/09/2010    Past Medical History:  Diagnosis Date  . Adenocarcinoma of right lung ([place])  01/06/2016  . Anginal chest pain at rest [facility])    Chronic non, controlled on Lorazepam  . Anxiety   . Arthritis    "all over"   . BPH (benign prostatic hyperplasia)   . CAD (coronary artery disease)   . Chronic lower back pain   . Complication of anesthesia    "problems making water afterwards"  . Depression   . DJD (degenerative joint disease)   . Esophageal ulcer without bleeding    bid ppi indefinitely  . Family history of adverse reaction to anesthesia    "children w/PONV"  . GERD (gastroesophageal reflux disease)   . Headache    "couple/week maybe" (09/04/2015)  . Hemochromatosis    Possible, elevated iron stores, hook-like osteophytes on hand films, normal LFTs  . Hepatitis    "yellow jaundice as a baby"  . History of ASCVD    MULTIVESSEL  . Hyperlipidemia   . Hypertension   . Osteoarthritis   . Pneumonia    "several times; got a little now" (09/04/2015)  . Rheumatoid arthritis ([place])   . Subdural hematoma ([facility])    small after fall 09/2016-plavix held, neurosurgery consulted.  Asymptomatic.    Marland Kitchen 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[place]) 02/2018   on Lovenox lifelong since he failed PO Eliquis    Tobacco History: Social History   Tobacco Use  Smoking Status Never Smoker  Smokeless Tobacco Former Systems developer  . Types: Chew  Tobacco Comment   "quit chewing in the 1960s"   Counseling given: Not Answered Comment: "quit chewing in the 1960s"   Outpatient Medications Prior to Visit  Medication Sig Dispense Refill  . acetaminophen (TYLENOL) 325 MG tablet Take  1-2 tablets (325-650 mg total) by mouth every 4 (four) hours as needed for mild pain.    Marland Kitchen albuterol (VENTOLIN HFA) 108 (90 Base) MCG/ACT inhaler Inhale 2 puffs into the lungs every 4 (four) hours as needed for up to 14 days for wheezing or shortness of breath. 18 g 0  . ALPRAZolam (XANAX) 0.5 MG tablet Take 0.5 tablets (0.25 mg total) by mouth 3 (three) times daily as needed for anxiety. 30 tablet 0  . camphor-menthol (SARNA) lotion Apply  1 application topically daily as needed for itching.    . cephALEXin (KEFLEX) 500 MG capsule Take 1 capsule (500 mg total) by mouth 4 (four) times daily for 7 days. 28 capsule 0  . diclofenac sodium (VOLTAREN) 1 % GEL Apply 2 g topically 4 (four) times daily. 350 g 1  . fluconazole (DIFLUCAN) 100 MG tablet Take 100 mg by mouth daily.    Marland Kitchen FLUoxetine (PROZAC) 20 MG capsule Take 20 mg by mouth daily.    . furosemide (LASIX) 20 MG tablet Take 1 tablet (20 mg total) by mouth daily. 30 tablet 0  . gabapentin (NEURONTIN) 100 MG capsule Take 1 capsule (100 mg total) by mouth 3 (three) times daily. 90 capsule 3  . Lifitegrast (XIIDRA) 5 % SOLN Place 1-2 drops into both eyes daily.     . magnesium oxide (MAG-OX) 400 (241.3 Mg) MG tablet Take 1 tablet (400 mg total) by mouth 2 (two) times daily. 180 tablet 3  . metoprolol tartrate (LOPRESSOR) 25 MG tablet Take 0.5 tablets (12.5 mg total) by mouth 2 (two) times daily. 90 tablet 3  . neomycin-polymyxin-hydrocortisone (CORTISPORIN) OTIC solution Place 4 drops into both ears 4 (four) times daily. 10 mL 2  . nitroGLYCERIN (NITROSTAT) 0.4 MG SL tablet Place 1 tablet (0.4 mg total) under the tongue every 5 (five) minutes as needed for chest pain. 25 tablet 2  . polyethylene glycol (MIRALAX / GLYCOLAX) 17 g packet Take 17 g by mouth daily as needed for mild constipation. 14 each 0  . Rivaroxaban (XARELTO) 15 MG TABS tablet Take 1 tablet (15 mg total) by mouth daily with supper. 90 tablet 3  . rosuvastatin (CRESTOR) 5 MG tablet Take 1 tablet (5 mg total) by mouth daily at 6 PM. 30 tablet 0  . silodosin (RAPAFLO) 8 MG CAPS capsule Take 8 mg by mouth daily with breakfast.    . tamsulosin (FLOMAX) 0.4 MG CAPS capsule Take 2 capsules (0.8 mg total) by mouth daily after supper. (Patient not taking: Reported on 02/18/2019) 180 capsule 3   No facility-administered medications prior to visit.      Review of Systems:   Constitutional:   No  weight loss, night sweats,   Fevers, chills, + fatigue, or  lassitude.  HEENT:   No headaches,  Difficulty swallowing,  Tooth/dental problems, or  Sore throat,                No sneezing, itching, ear ache, nasal congestion, post nasal drip,   CV:  No chest pain,  Orthopnea, PND, swelling in lower extremities, anasarca, dizziness, palpitations, syncope.   GI  No heartburn, indigestion, abdominal pain, nausea, vomiting, diarrhea, change in bowel habits, loss of appetite, bloody stools.   Resp:    No excess mucus, no productive cough,  No non-productive cough,  No coughing up of blood.  No change in color of mucus.  No wheezing.  No chest wall deformity  Skin: no rash or lesions.  GU:  no dysuria, change in color of urine, no urgency or frequency.  No flank pain, no hematuria   MS:  No joint pain or swelling.  No decreased range of motion.  No back pain.    Physical Exam  BP 132/66 (BP Location: Left Arm, Cuff Size: Normal)   Pulse 78   Temp (!) 97.1 F (36.2 C) (Temporal)   SpO2 98%   GEN: A/Ox3; pleasant , NAD, elderly    HEENT:  [place]/AT,   NOSE-clear, THROAT-clear, no lesions, no postnasal drip or exudate noted.   NECK:  Supple w/ fair ROM; no JVD; normal carotid impulses w/o bruits; no thyromegaly or nodules palpated; no lymphadenopathy.    RESP  Clear  P & A; w/o, wheezes/ rales/ or rhonchi. no accessory muscle use, no dullness to percussion  CARD:  RRR, no m/r/g, tr  peripheral edema, pulses intact, no cyanosis or clubbing.  GI:   Soft & nt; nml bowel sounds; no organomegaly or masses detected.   Musco: Warm bil,  Right lateral ankle swelling/redness and tenderness. No red streaks.  Neuro: alert, no focal deficits noted.    Skin: Warm, no lesions or rashes    Lab Results:  CBC    Component Value Date/Time   WBC 9.2 12/24/2018 1600   RBC 4.35 12/24/2018 1600   HGB 13.4 12/24/2018 1600   HGB 13.4 08/16/2016 1155   HCT 40.5 12/24/2018 1600   HCT 39.8 08/16/2016 1155   PLT 154 12/24/2018  1600   PLT 81 (L) 08/16/2016 1155   MCV 93.1 12/24/2018 1600   MCV 92.3 08/16/2016 1155   MCH 30.8 12/24/2018 1600   MCHC 33.1 12/24/2018 1600   RDW 14.8 12/24/2018 1600   RDW 13.3 08/16/2016 1155   LYMPHSABS 1.9 12/24/2018 1600   LYMPHSABS 1.3 08/16/2016 1155   MONOABS 1.1 (H) 12/24/2018 1600   MONOABS 1.2 (H) 08/16/2016 1155   EOSABS 0.3 12/24/2018 1600   EOSABS 0.1 08/16/2016 1155   BASOSABS 0.0 12/24/2018 1600   BASOSABS 0.0 08/16/2016 1155    BMET    Component Value Date/Time   NA 139 12/24/2018 1600   NA 143 08/16/2016 1155   K 4.1 12/24/2018 1600   K 4.3 08/16/2016 1155   CL 103 12/24/2018 1600   CO2 27 12/24/2018 1600   CO2 24 08/16/2016 1155   GLUCOSE 95 12/24/2018 1600   GLUCOSE 88 08/16/2016 1155   GLUCOSE 120 (H) 04/20/2015 1600   BUN 28 (H) 12/24/2018 1600   BUN 14.6 08/16/2016 1155   CREATININE 1.45 (H) 12/24/2018 1600   CREATININE 1.21 12/07/2018 1445   CREATININE 1.23 (H) 10/15/2018 1504   CREATININE 1.2 08/16/2016 1155   CALCIUM 9.3 12/24/2018 1600   CALCIUM 9.0 08/16/2016 1155   GFRNONAA 42 (L) 12/24/2018 1600   GFRNONAA 53 (L) 12/07/2018 1445   GFRNONAA 52 (L) 10/15/2018 1504   GFRAA 49 (L) 12/24/2018 1600   GFRAA >60 12/07/2018 1445   GFRAA 60 10/15/2018 1504    BNP    Component Value Date/Time   BNP 267.7 (H) 08/26/2018 1131    ProBNP    Component Value Date/Time   PROBNP 666.0 (H) 06/21/2017 1617    Imaging: No results found.    PFT Results Latest Ref Rng & Units 01/05/2016  FVC-Pre L 3.26  FVC-Predicted Pre % 111  FVC-Post L 3.23  FVC-Predicted Post % 109  Pre FEV1/FVC % % 80  Post FEV1/FCV % % 80  FEV1-Pre L 2.61  FEV1-Predicted  Pre % 131  FEV1-Post L 2.58  DLCO UNC% % 44  DLCO COR %Predicted % 56  TLC L 5.47  TLC % Predicted % 90  RV % Predicted % 86    No results found for: NITRICOXIDE      Assessment & Plan:   Pulmonary embolism [facility]) Diagnosed November 2019 presumed to be a Eliquis failure as he was  taking this for A. fib with reported compliance.  Patient was placed on Lovenox unfortunately had a intracranial hemorrhage April 2020 and taken off of Lovenox.  Currently on Xarelto with no reportable bleeding issues.  Follow-up CT chest with PE protocol May 2020 was negative for PE.  Patient did have some associated pulmonary hypertension on echo previously.  This seems to have improved and is currently stable with only mildly elevated levels.  We will continue on the current regimen and monitor extremely closely for bleeding issues. Patient is on Xarelto for A. Fib.  Is high risk for bleeding and complications.  Adenocarcinoma of right lung [facility]) Right-sided lung cancer adenocarcinoma diagnosed September 2017 status post SBRT with suspected radiation pneumonitis. Most recent CT chest December 07, 2018 showed stable area on the right side with no evidence of disease progression or metastasis.      Rexene Edison, NP 02/18/2019

## 2019-02-18 NOTE — Assessment & Plan Note (Signed)
Right-sided lung cancer adenocarcinoma diagnosed September 2017 status post SBRT with suspected radiation pneumonitis. Most recent CT chest December 07, 2018 showed stable area on the right side with no evidence of disease progression or metastasis.

## 2019-02-19 ENCOUNTER — Ambulatory Visit (INDEPENDENT_AMBULATORY_CARE_PROVIDER_SITE_OTHER): Payer: Medicare HMO | Admitting: Family Medicine

## 2019-02-19 VITALS — BP 100/60 | HR 74 | Temp 97.6°F | Resp 16 | Ht 68.0 in

## 2019-02-19 DIAGNOSIS — M25571 Pain in right ankle and joints of right foot: Secondary | ICD-10-CM | POA: Diagnosis not present

## 2019-02-19 MED ORDER — SULFAMETHOXAZOLE-TRIMETHOPRIM 800-160 MG PO TABS: 1.0000 | ORAL_TABLET | Freq: Two times a day (BID) | ORAL | 0 refills | Status: DC

## 2019-02-19 MED ORDER — PREDNISONE 20 MG PO TABS: ORAL_TABLET | ORAL | 0 refills | Status: DC

## 2019-02-19 NOTE — Progress Notes (Signed)
Subjective:    Patient ID: Evan [name], male    DOB: December 05, 1927, 85 y.o.   MRN: [id number]  HPI Patient was seen in the local urgent care on Friday and was diagnosed with cellulitis over the right lateral malleolus and was started appropriately on Keflex.  The skin in that area is erythematous and warm to the touch.  Patient reports pain with any range of motion in the right ankle.  Past medical history is significant for gout however.  He has been on Keflex all weekend.  There has been no improvement however there is also been no worsening.  Please see the photograph below:    Past Medical History:  Diagnosis Date  . Adenocarcinoma of right lung ([place]) 01/06/2016  . Anginal chest pain at rest [facility])    Chronic non, controlled on Lorazepam  . Anxiety   . Arthritis    "all over"   . BPH (benign prostatic hyperplasia)   . CAD (coronary artery disease)   . Chronic lower back pain   . Complication of anesthesia    "problems making water afterwards"  . Depression   . DJD (degenerative joint disease)   . Esophageal ulcer without bleeding    bid ppi indefinitely  . Family history of adverse reaction to anesthesia    "children w/PONV"  . GERD (gastroesophageal reflux disease)   . Headache    "couple/week maybe" (09/04/2015)  . Hemochromatosis    Possible, elevated iron stores, hook-like osteophytes on hand films, normal LFTs  . Hepatitis    "yellow jaundice as a baby"  . History of ASCVD    MULTIVESSEL  . Hyperlipidemia   . Hypertension   . Osteoarthritis   . Pneumonia    "several times; got a little now" (09/04/2015)  . Rheumatoid arthritis ([place])   . Subdural hematoma (HCC)    small after fall 09/2016-plavix held, neurosurgery consulted.  Asymptomatic.    Marland Kitchen 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[place]) 02/2018   on Lovenox lifelong since he failed PO Eliquis   Past Surgical History:  Procedure Laterality Date  . ARM SKIN LESION BIOPSY / EXCISION Left 09/02/2015  . CATARACT EXTRACTION W/ INTRAOCULAR  LENS  IMPLANT, BILATERAL Bilateral   . CHOLECYSTECTOMY OPEN    . CORNEAL TRANSPLANT Bilateral    "one at [facility]; one at [facility]"  . CORONARY ANGIOPLASTY WITH STENT PLACEMENT    . CORONARY ARTERY BYPASS GRAFT  1999  . DESCENDING AORTIC ANEURYSM REPAIR W/ STENT     DE stent ostium into the right radial free graft at OM-, 02-2007  . ESOPHAGOGASTRODUODENOSCOPY N/A 11/09/2014   Procedure: ESOPHAGOGASTRODUODENOSCOPY (EGD);  Surgeon: [name], MD;  [facility];  Service: Endoscopy;  Laterality: N/A;  . INGUINAL HERNIA REPAIR    . JOINT REPLACEMENT    . LEFT HEART CATH AND CORS/GRAFTS ANGIOGRAPHY N/A 06/27/2017   Procedure: LEFT HEART CATH AND CORS/GRAFTS ANGIOGRAPHY;  Surgeon: [name], MD;  Location: [facility];  Service: Cardiovascular;  Laterality: N/A;  . NASAL SINUS SURGERY    . SHOULDER OPEN ROTATOR CUFF REPAIR Right   . TOTAL KNEE ARTHROPLASTY Bilateral    Current Outpatient Medications on File Prior to Visit  Medication Sig Dispense Refill  . acetaminophen (TYLENOL) 325 MG tablet Take 1-2 tablets (325-650 mg total) by mouth every 4 (four) hours as needed for mild pain.    Marland Kitchen albuterol (VENTOLIN HFA) 108 (90 Base) MCG/ACT inhaler Inhale 2 puffs into the lungs every 4 (four) hours as needed  for up to 14 days for wheezing or shortness of breath. 18 g 0  . ALPRAZolam (XANAX) 0.5 MG tablet Take 0.5 tablets (0.25 mg total) by mouth 3 (three) times daily as needed for anxiety. 30 tablet 0  . camphor-menthol (SARNA) lotion Apply 1 application topically daily as needed for itching.    . cephALEXin (KEFLEX) 500 MG capsule Take 1 capsule (500 mg total) by mouth 4 (four) times daily for 7 days. 28 capsule 0  . diclofenac sodium (VOLTAREN) 1 % GEL Apply 2 g topically 4 (four) times daily. 350 g 1  . FLUoxetine (PROZAC) 20 MG capsule Take 20 mg by mouth daily.    . furosemide (LASIX) 20 MG tablet Take 1 tablet (20 mg total) by mouth daily. 30 tablet 0  . gabapentin (NEURONTIN) 100  MG capsule Take 1 capsule (100 mg total) by mouth 3 (three) times daily. 90 capsule 3  . Lifitegrast (XIIDRA) 5 % SOLN Place 1-2 drops into both eyes daily.     . magnesium oxide (MAG-OX) 400 (241.3 Mg) MG tablet Take 1 tablet (400 mg total) by mouth 2 (two) times daily. 180 tablet 3  . metoprolol tartrate (LOPRESSOR) 25 MG tablet Take 0.5 tablets (12.5 mg total) by mouth 2 (two) times daily. 90 tablet 3  . neomycin-polymyxin-hydrocortisone (CORTISPORIN) OTIC solution Place 4 drops into both ears 4 (four) times daily. 10 mL 2  . nitroGLYCERIN (NITROSTAT) 0.4 MG SL tablet Place 1 tablet (0.4 mg total) under the tongue every 5 (five) minutes as needed for chest pain. 25 tablet 2  . polyethylene glycol (MIRALAX / GLYCOLAX) 17 g packet Take 17 g by mouth daily as needed for mild constipation. 14 each 0  . Rivaroxaban (XARELTO) 15 MG TABS tablet Take 1 tablet (15 mg total) by mouth daily with supper. 90 tablet 3  . rosuvastatin (CRESTOR) 5 MG tablet Take 1 tablet (5 mg total) by mouth daily at 6 PM. 30 tablet 0  . silodosin (RAPAFLO) 8 MG CAPS capsule Take 8 mg by mouth daily with breakfast.     No current facility-administered medications on file prior to visit.    Allergies  Allergen Reactions  . Feldene [Piroxicam] Other (See Comments)    REACTION: Blisters  . Statins Other (See Comments)    Myalgias  . Doxycycline Other (See Comments)    REACTION:  unknown  . Latex Rash  . Tequin Anxiety  . Valium Other (See Comments)    REACTION:  unknown   Social History   Socioeconomic History  . Marital status: Widowed    Spouse name: Not on file  . Number of children: Not on file  . Years of education: Not on file  . Highest education level: Not on file  Occupational History  . Not on file  Social Needs  . Financial resource strain: Not on file  . Food insecurity    Worry: Not on file    Inability: Not on file  . Transportation needs    Medical: Not on file    Non-medical: Not on file   Tobacco Use  . Smoking status: Never Smoker  . Smokeless tobacco: Former Systems developer    Types: Chew  . Tobacco comment: "quit chewing in the 1960s"  Substance and Sexual Activity  . Alcohol use: No  . Drug use: No  . Sexual activity: Never  Lifestyle  . Physical activity    Days per week: Not on file    Minutes per session: Not  on file  . Stress: Not on file  Relationships  . Social Herbalist on phone: Not on file    Gets together: Not on file    Attends religious service: Not on file    Active member of club or organization: Not on file    Attends meetings of clubs or organizations: Not on file    Relationship status: Not on file  . Intimate partner violence    Fear of current or ex partner: Not on file    Emotionally abused: Not on file    Physically abused: Not on file    Forced sexual activity: Not on file  Other Topics Concern  . Not on file  Social History Narrative   01-05-18 Unable to ask abuse questions family with him today.   11-012-19 Unable to ask abuse questions family with him today.      Review of Systems  All other systems reviewed and are negative.      Objective:   Physical Exam Constitutional:      Appearance: He is underweight.  HENT:     Right Ear: Decreased hearing noted.     Left Ear: Decreased hearing noted.     Nose: Nose normal. No congestion or rhinorrhea.  Cardiovascular:     Rate and Rhythm: Bradycardia present. Rhythm irregular.  Pulmonary:     Effort: Pulmonary effort is normal. No respiratory distress.     Breath sounds: No wheezing or rales.  Abdominal:     General: Bowel sounds are normal.     Palpations: Abdomen is soft.  Musculoskeletal:     Right ankle: He exhibits decreased range of motion and swelling.       Feet:  Neurological:     Mental Status: He is alert and oriented to person, place, and time.     Motor: Weakness present.     Gait: Gait abnormal.  Psychiatric:        Mood and Affect: Mood normal.         Behavior: Behavior normal.   In wheelchair        Assessment & Plan:   Differential diagnosis includes gout exacerbation of the right ankle, cellulitis resistant to Keflex, less likely septic arthritis.  The patient has no systemic symptoms.  Given the fact he has been on Keflex all weekend and has not deteriorated and has no systemic symptoms, I do not feel that a resistant pathogen is the likely cause as I suspect the patient would have had systemic worsening if not on the appropriate antibiotic.  Although I am uncertain I believe this is most likely a gout exacerbation.  Therefore we will start the patient on prednisone.  Out of an abundance of caution I will also put the patient on Bactrim double strength tablets twice daily to cover for possible MRSA and have the patient discontinue Keflex.  Reassess in 48 hours or sooner if worse

## 2019-02-20 DIAGNOSIS — I13 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 failure and stage 1 through stage 4 chronic kidney disease, or unspecified chronic kidney disease: Secondary | ICD-10-CM | POA: Diagnosis not present

## 2019-02-20 DIAGNOSIS — R2689 Other abnormalities of gait and mobility: Secondary | ICD-10-CM | POA: Diagnosis not present

## 2019-02-20 DIAGNOSIS — R531 Weakness: Secondary | ICD-10-CM | POA: Diagnosis not present

## 2019-02-20 DIAGNOSIS — I5030 Unspecified diastolic (congestive) heart failure: Secondary | ICD-10-CM | POA: Diagnosis not present

## 2019-02-20 DIAGNOSIS — I25118 Atherosclerotic heart disease of native coronary artery with other forms of angina pectoris: Secondary | ICD-10-CM | POA: Diagnosis not present

## 2019-02-20 DIAGNOSIS — I69298 Other sequelae of other nontraumatic intracranial hemorrhage: Secondary | ICD-10-CM | POA: Diagnosis not present

## 2019-02-20 DIAGNOSIS — I69293 Ataxia following other nontraumatic intracranial hemorrhage: Secondary | ICD-10-CM | POA: Diagnosis not present

## 2019-02-20 DIAGNOSIS — I252 Old myocardial infarction: Secondary | ICD-10-CM | POA: Diagnosis not present

## 2019-02-20 DIAGNOSIS — I69218 Other symptoms and signs involving cognitive functions following other nontraumatic intracranial hemorrhage: Secondary | ICD-10-CM | POA: Diagnosis not present

## 2019-02-22 ENCOUNTER — Ambulatory Visit (INDEPENDENT_AMBULATORY_CARE_PROVIDER_SITE_OTHER): Payer: Medicare HMO | Admitting: Otolaryngology

## 2019-02-22 ENCOUNTER — Other Ambulatory Visit: Payer: Self-pay

## 2019-02-22 VITALS — Temp 97.7°F

## 2019-02-22 DIAGNOSIS — I69293 Ataxia following other nontraumatic intracranial hemorrhage: Secondary | ICD-10-CM | POA: Diagnosis not present

## 2019-02-22 DIAGNOSIS — I13 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 failure and stage 1 through stage 4 chronic kidney disease, or unspecified chronic kidney disease: Secondary | ICD-10-CM | POA: Diagnosis not present

## 2019-02-22 DIAGNOSIS — I25118 Atherosclerotic heart disease of native coronary artery with other forms of angina pectoris: Secondary | ICD-10-CM | POA: Diagnosis not present

## 2019-02-22 DIAGNOSIS — I69298 Other sequelae of other nontraumatic intracranial hemorrhage: Secondary | ICD-10-CM | POA: Diagnosis not present

## 2019-02-22 DIAGNOSIS — H60311 Diffuse otitis externa, right ear: Secondary | ICD-10-CM | POA: Diagnosis not present

## 2019-02-22 DIAGNOSIS — I252 Old myocardial infarction: Secondary | ICD-10-CM | POA: Diagnosis not present

## 2019-02-22 DIAGNOSIS — R531 Weakness: Secondary | ICD-10-CM | POA: Diagnosis not present

## 2019-02-22 DIAGNOSIS — R2689 Other abnormalities of gait and mobility: Secondary | ICD-10-CM | POA: Diagnosis not present

## 2019-02-22 DIAGNOSIS — I69218 Other symptoms and signs involving cognitive functions following other nontraumatic intracranial hemorrhage: Secondary | ICD-10-CM | POA: Diagnosis not present

## 2019-02-22 DIAGNOSIS — I5030 Unspecified diastolic (congestive) heart failure: Secondary | ICD-10-CM | POA: Diagnosis not present

## 2019-02-22 NOTE — Progress Notes (Signed)
HPI: Evan [name] is a 85 y.o. male who presents for evaluation of right ear complaints.  He wears bilateral hearing aids.  More recently has complained of chronic itching in his right ear.  He was initially tried on some Cortisporin eardrops but apparently developed a reaction to the eardrops.  On recent examination he was found to have a lot of debris within the right ear canal and is referred here.  He does not complain of any pain in the ear no drainage from the ear mostly just itching in the right ear canal.  The left ear does not give him much problems.  Past Medical History:  Diagnosis Date  . Adenocarcinoma of right lung ([place]) 01/06/2016  . Anginal chest pain at rest [facility])    Chronic non, controlled on Lorazepam  . Anxiety   . Arthritis    "all over"   . BPH (benign prostatic hyperplasia)   . CAD (coronary artery disease)   . Chronic lower back pain   . Complication of anesthesia    "problems making water afterwards"  . Depression   . DJD (degenerative joint disease)   . Esophageal ulcer without bleeding    bid ppi indefinitely  . Family history of adverse reaction to anesthesia    "children w/PONV"  . GERD (gastroesophageal reflux disease)   . Headache    "couple/week maybe" (09/04/2015)  . Hemochromatosis    Possible, elevated iron stores, hook-like osteophytes on hand films, normal LFTs  . Hepatitis    "yellow jaundice as a baby"  . History of ASCVD    MULTIVESSEL  . Hyperlipidemia   . Hypertension   . Osteoarthritis   . Pneumonia    "several times; got a little now" (09/04/2015)  . Rheumatoid arthritis ([place])   . Subdural hematoma ([facility])    small after fall 09/2016-plavix held, neurosurgery consulted.  Asymptomatic.    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 ARM SKIN LESION BIOPSY / EXCISION Left 09/02/2015  . CATARACT EXTRACTION W/ INTRAOCULAR LENS  IMPLANT, BILATERAL  Bilateral   . CHOLECYSTECTOMY OPEN    . CORNEAL TRANSPLANT Bilateral    "one at [facility]; one [facility]"  . CORONARY ANGIOPLASTY WITH STENT PLACEMENT    . CORONARY ARTERY BYPASS GRAFT  1999  . DESCENDING AORTIC ANEURYSM REPAIR W/ STENT     DE stent ostium into the right radial free graft at OM-, 02-2007  . ESOPHAGOGASTRODUODENOSCOPY N/A 11/09/2014   Procedure: ESOPHAGOGASTRODUODENOSCOPY (EGD);  Surgeon: [name], MD;  Location: [facility] ENDOSCOPY;  Service: Endoscopy;  Laterality: N/A;  . INGUINAL HERNIA REPAIR    . JOINT REPLACEMENT    . LEFT HEART CATH AND CORS/GRAFTS ANGIOGRAPHY N/A 06/27/2017   Procedure: LEFT HEART CATH AND CORS/GRAFTS ANGIOGRAPHY;  Surgeon: [name], MD;  Location: [place] CV LAB;  Service: Cardiovascular;  Laterality: N/A;  . NASAL SINUS SURGERY    . SHOULDER OPEN ROTATOR CUFF REPAIR Right   . TOTAL KNEE ARTHROPLASTY Bilateral    Social History   Socioeconomic History  . Marital status: Widowed    Spouse name: Not on file  . Number of children: Not on file  . Years of education: Not on file  . Highest education level: Not on file  Occupational History  . Not on file  Social Needs  . Financial resource strain: Not on file  . Food insecurity  Worry: Not on file    Inability: Not on file  . Transportation needs    Medical: Not on file    Non-medical: Not on file  Tobacco Use  . Smoking status: Never Smoker  . Smokeless tobacco: Former Systems developer    Types: Chew  . Tobacco comment: "quit chewing in the 1960s"  Substance and Sexual Activity  . Alcohol use: No  . Drug use: No  . Sexual activity: Never  Lifestyle  . Physical activity    Days per week: Not on file    Minutes per session: Not on file  . Stress: Not on file  Relationships  . Social Herbalist on phone: Not on file    Gets together: Not on file    Attends religious service: Not on file    Active member of club or organization: Not on file    Attends meetings of clubs or  organizations: Not on file    Relationship status: Not on file  Other Topics Concern  . Not on file  Social History Narrative   01-05-18 Unable to ask abuse questions family with him today.   11-012-19 Unable to ask abuse questions family with him today.   Family History  Problem Relation Age of Onset  . Arthritis-Osteo Sister   . Heart attack Brother   . Heart attack Other   . Cancer Neg Hx    Allergies  Allergen Reactions  . Feldene [Piroxicam] Other (See Comments)    REACTION: Blisters  . Neomycin-Polymyxin-Hc Itching  . Statins Other (See Comments)    Myalgias  . Doxycycline Other (See Comments)    REACTION:  unknown  . Latex Rash  . Tequin Anxiety  . Valium Other (See Comments)    REACTION:  unknown   Prior to Admission medications   Medication Sig Start Date End Date Taking? Authorizing Provider  acetaminophen (TYLENOL) 325 MG tablet Take 1-2 tablets (325-650 mg total) by mouth every 4 (four) hours as needed for mild pain. 08/09/18   Love, Ivan Anchors, PA-C  albuterol (VENTOLIN HFA) 108 (90 Base) MCG/ACT inhaler Inhale 2 puffs into the lungs every 4 (four) hours as needed for up to 14 days for wheezing or shortness of breath. 11/05/18 02/18/20  Noemi Chapel, MD  ALPRAZolam Duanne Moron) 0.5 MG tablet Take 0.5 tablets (0.25 mg total) by mouth 3 (three) times daily as needed for anxiety. 08/31/18   [name], MD  camphor-menthol [facility]) lotion Apply 1 application topically daily as needed for itching. 08/24/18   Aline August, MD  cephALEXin (KEFLEX) 500 MG capsule Take 1 capsule (500 mg total) by mouth 4 (four) times daily for 7 days. 02/15/19 02/22/19  Wieters, Hallie C, PA-C  diclofenac sodium (VOLTAREN) 1 % GEL Apply 2 g topically 4 (four) times daily. 12/19/18   Jamse Arn, MD  FLUoxetine (PROZAC) 20 MG capsule Take 20 mg by mouth daily.    [provider]  furosemide (LASIX) 20 MG tablet Take 1 tablet (20 mg total) by mouth daily. 08/14/18   Angiulli, Lavon Paganini, PA-C  gabapentin (NEURONTIN) 100 MG capsule Take 1 capsule (100 mg total) by mouth 3 (three) times daily. 01/04/19   [name], MD  Lifitegrast Shirley Friar) 5 % SOLN Place 1-2 drops into both eyes daily.     [provider]  magnesium oxide (MAG-OX) 400 (241.3 Mg) MG tablet Take 1 tablet (400 mg total) by mouth 2 (two) times daily. 08/14/18  Angiulli, Lavon Paganini, PA-C  metoprolol tartrate (LOPRESSOR) 25 MG tablet Take 0.5 tablets (12.5 mg total) by mouth 2 (two) times daily. 10/04/18   [name], MD  neomycin-polymyxin-hydrocortisone (CORTISPORIN) OTIC solution Place 4 drops into both ears 4 (four) times daily. Patient not taking: Reported on 02/22/2019 01/04/19   [name], MD  nitroGLYCERIN (NITROSTAT) 0.4 MG SL tablet Place 1 tablet (0.4 mg total) under the tongue every 5 (five) minutes as needed for chest pain. 12/06/17   Daune Perch, NP  polyethylene glycol (MIRALAX / GLYCOLAX) 17 g packet Take 17 g by mouth daily as needed for mild constipation. 08/14/18   Angiulli, Lavon Paganini, PA-C  Rivaroxaban (XARELTO) 15 MG TABS tablet Take 1 tablet (15 mg total) by mouth daily with supper. 12/27/18   Jettie Booze, MD  rosuvastatin (CRESTOR) 5 MG tablet Take 1 tablet (5 mg total) by mouth daily at 6 PM. 08/14/18   Angiulli, Lavon Paganini, PA-C  silodosin (RAPAFLO) 8 MG CAPS capsule Take 8 mg by mouth daily with breakfast.    [provider]  sulfamethoxazole-trimethoprim (BACTRIM DS) 800-160 MG tablet Take 1 tablet by mouth 2 (two) times daily. 02/19/19   [name], MD     Positive ROS: Otherwise negative  All other systems have been reviewed and were otherwise negative with the exception of those mentioned in the HPI and as above.  Physical Exam: Constitutional: Alert, well-appearing, no acute distress.  Patient is in a wheelchair. Ears: External ears without lesions or tenderness.  Left ear canal is clear TM is normal.  Right ear canal with debris after  cleaning the debris from the ear canal patient has chronic TM changes and appears to have had surgery on his right ear as he has an attic defect as well as a posterior superior TM defect with a small perforation and retracted TM.  No active drainage noted.  He has mild chronic external otitis and the debris was cleaned in the office using suction and CSF HC powder was applied. Nasal: External nose without lesions. Septum relatively midline. Clear nasal passages Oral: Lips and gums without lesions. Tongue and palate mucosa without lesions. Posterior oropharynx clear. Neck: No palpable adenopathy or masses Respiratory: Breathing comfortably  Skin: No facial/neck lesions or rash noted.  Cerumen impaction removal  Date/Time: 02/22/2019 4:07 PM Performed by: Rozetta Nunnery, MD Authorized by: Rozetta Nunnery, MD   Consent:    Consent obtained:  Verbal   Consent given by:  Patient   Risks discussed:  Pain and bleeding Procedure details:    Location:  R ear   Procedure type: suction   Post-procedure details:    Inspection:  Macerated skin (Right TM with chronic changes and posterior superior retraction pocket with perforation but no drainage.)   Hearing quality:  Improved   Patient tolerance of procedure:  Tolerated well, no immediate complications    Assessment: Mild right external otitis Chronic right TM perforation Chronic right ear canal itching  Plan: I prescribed fluocinolone oil 0.01% drops for the right ear as needed itching.  4 to 5 drops twice daily for 3 days. He will follow-up if he develops any ear pain or drainage from the right ear.  Radene Journey, MD

## 2019-02-24 DIAGNOSIS — J449 Chronic obstructive pulmonary disease, unspecified: Secondary | ICD-10-CM | POA: Diagnosis not present

## 2019-02-24 DIAGNOSIS — Z85118 Personal history of other malignant neoplasm of bronchus and lung: Secondary | ICD-10-CM | POA: Diagnosis not present

## 2019-02-25 ENCOUNTER — Telehealth: Payer: Self-pay | Admitting: Family Medicine

## 2019-02-25 DIAGNOSIS — I69218 Other symptoms and signs involving cognitive functions following other nontraumatic intracranial hemorrhage: Secondary | ICD-10-CM | POA: Diagnosis not present

## 2019-02-25 DIAGNOSIS — I69298 Other sequelae of other nontraumatic intracranial hemorrhage: Secondary | ICD-10-CM | POA: Diagnosis not present

## 2019-02-25 DIAGNOSIS — I5030 Unspecified diastolic (congestive) heart failure: Secondary | ICD-10-CM | POA: Diagnosis not present

## 2019-02-25 DIAGNOSIS — R531 Weakness: Secondary | ICD-10-CM | POA: Diagnosis not present

## 2019-02-25 DIAGNOSIS — R2689 Other abnormalities of gait and mobility: Secondary | ICD-10-CM | POA: Diagnosis not present

## 2019-02-25 DIAGNOSIS — I69293 Ataxia following other nontraumatic intracranial hemorrhage: Secondary | ICD-10-CM | POA: Diagnosis not present

## 2019-02-25 DIAGNOSIS — I252 Old myocardial infarction: Secondary | ICD-10-CM | POA: Diagnosis not present

## 2019-02-25 DIAGNOSIS — I25118 Atherosclerotic heart disease of native coronary artery with other forms of angina pectoris: Secondary | ICD-10-CM | POA: Diagnosis not present

## 2019-02-25 DIAGNOSIS — I13 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 failure and stage 1 through stage 4 chronic kidney disease, or unspecified chronic kidney disease: Secondary | ICD-10-CM | POA: Diagnosis not present

## 2019-02-25 NOTE — Telephone Encounter (Signed)
Agree to below

## 2019-02-25 NOTE — Telephone Encounter (Signed)
Received a phone call from [facility] with [place] requesting verbal orders for Nursing Eval and treat for patient's ankle. Anne Ng states that patient's ankle is oozing and needs to be redressed. Gave verbal orders for Nursing to evaluate and treat ankle.

## 2019-02-27 DIAGNOSIS — I69218 Other symptoms and signs involving cognitive functions following other nontraumatic intracranial hemorrhage: Secondary | ICD-10-CM | POA: Diagnosis not present

## 2019-02-27 DIAGNOSIS — R531 Weakness: Secondary | ICD-10-CM | POA: Diagnosis not present

## 2019-02-27 DIAGNOSIS — I5030 Unspecified diastolic (congestive) heart failure: Secondary | ICD-10-CM | POA: Diagnosis not present

## 2019-02-27 DIAGNOSIS — I25118 Atherosclerotic heart disease of native coronary artery with other forms of angina pectoris: Secondary | ICD-10-CM | POA: Diagnosis not present

## 2019-02-27 DIAGNOSIS — I13 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 failure and stage 1 through stage 4 chronic kidney disease, or unspecified chronic kidney disease: Secondary | ICD-10-CM | POA: Diagnosis not present

## 2019-02-27 DIAGNOSIS — I252 Old myocardial infarction: Secondary | ICD-10-CM | POA: Diagnosis not present

## 2019-02-27 DIAGNOSIS — R2689 Other abnormalities of gait and mobility: Secondary | ICD-10-CM | POA: Diagnosis not present

## 2019-02-27 DIAGNOSIS — I69298 Other sequelae of other nontraumatic intracranial hemorrhage: Secondary | ICD-10-CM | POA: Diagnosis not present

## 2019-02-27 DIAGNOSIS — I69293 Ataxia following other nontraumatic intracranial hemorrhage: Secondary | ICD-10-CM | POA: Diagnosis not present

## 2019-03-01 DIAGNOSIS — R531 Weakness: Secondary | ICD-10-CM | POA: Diagnosis not present

## 2019-03-01 DIAGNOSIS — I69298 Other sequelae of other nontraumatic intracranial hemorrhage: Secondary | ICD-10-CM | POA: Diagnosis not present

## 2019-03-01 DIAGNOSIS — R2689 Other abnormalities of gait and mobility: Secondary | ICD-10-CM | POA: Diagnosis not present

## 2019-03-01 DIAGNOSIS — I13 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 failure and stage 1 through stage 4 chronic kidney disease, or unspecified chronic kidney disease: Secondary | ICD-10-CM | POA: Diagnosis not present

## 2019-03-01 DIAGNOSIS — I69293 Ataxia following other nontraumatic intracranial hemorrhage: Secondary | ICD-10-CM | POA: Diagnosis not present

## 2019-03-01 DIAGNOSIS — I252 Old myocardial infarction: Secondary | ICD-10-CM | POA: Diagnosis not present

## 2019-03-01 DIAGNOSIS — I25118 Atherosclerotic heart disease of native coronary artery with other forms of angina pectoris: Secondary | ICD-10-CM | POA: Diagnosis not present

## 2019-03-01 DIAGNOSIS — I69218 Other symptoms and signs involving cognitive functions following other nontraumatic intracranial hemorrhage: Secondary | ICD-10-CM | POA: Diagnosis not present

## 2019-03-01 DIAGNOSIS — I5030 Unspecified diastolic (congestive) heart failure: Secondary | ICD-10-CM | POA: Diagnosis not present

## 2019-03-04 DIAGNOSIS — I69293 Ataxia following other nontraumatic intracranial hemorrhage: Secondary | ICD-10-CM | POA: Diagnosis not present

## 2019-03-04 DIAGNOSIS — I69218 Other symptoms and signs involving cognitive functions following other nontraumatic intracranial hemorrhage: Secondary | ICD-10-CM | POA: Diagnosis not present

## 2019-03-04 DIAGNOSIS — I5030 Unspecified diastolic (congestive) heart failure: Secondary | ICD-10-CM | POA: Diagnosis not present

## 2019-03-04 DIAGNOSIS — R2689 Other abnormalities of gait and mobility: Secondary | ICD-10-CM | POA: Diagnosis not present

## 2019-03-04 DIAGNOSIS — I252 Old myocardial infarction: Secondary | ICD-10-CM | POA: Diagnosis not present

## 2019-03-04 DIAGNOSIS — I13 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 failure and stage 1 through stage 4 chronic kidney disease, or unspecified chronic kidney disease: Secondary | ICD-10-CM | POA: Diagnosis not present

## 2019-03-04 DIAGNOSIS — R531 Weakness: Secondary | ICD-10-CM | POA: Diagnosis not present

## 2019-03-04 DIAGNOSIS — I69298 Other sequelae of other nontraumatic intracranial hemorrhage: Secondary | ICD-10-CM | POA: Diagnosis not present

## 2019-03-04 DIAGNOSIS — I25118 Atherosclerotic heart disease of native coronary artery with other forms of angina pectoris: Secondary | ICD-10-CM | POA: Diagnosis not present

## 2019-03-06 DIAGNOSIS — I252 Old myocardial infarction: Secondary | ICD-10-CM | POA: Diagnosis not present

## 2019-03-06 DIAGNOSIS — I69298 Other sequelae of other nontraumatic intracranial hemorrhage: Secondary | ICD-10-CM | POA: Diagnosis not present

## 2019-03-06 DIAGNOSIS — I69218 Other symptoms and signs involving cognitive functions following other nontraumatic intracranial hemorrhage: Secondary | ICD-10-CM | POA: Diagnosis not present

## 2019-03-06 DIAGNOSIS — I25118 Atherosclerotic heart disease of native coronary artery with other forms of angina pectoris: Secondary | ICD-10-CM | POA: Diagnosis not present

## 2019-03-06 DIAGNOSIS — I5030 Unspecified diastolic (congestive) heart failure: Secondary | ICD-10-CM | POA: Diagnosis not present

## 2019-03-06 DIAGNOSIS — I69293 Ataxia following other nontraumatic intracranial hemorrhage: Secondary | ICD-10-CM | POA: Diagnosis not present

## 2019-03-06 DIAGNOSIS — R531 Weakness: Secondary | ICD-10-CM | POA: Diagnosis not present

## 2019-03-06 DIAGNOSIS — R2689 Other abnormalities of gait and mobility: Secondary | ICD-10-CM | POA: Diagnosis not present

## 2019-03-06 DIAGNOSIS — I13 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 failure and stage 1 through stage 4 chronic kidney disease, or unspecified chronic kidney disease: Secondary | ICD-10-CM | POA: Diagnosis not present

## 2019-03-08 DIAGNOSIS — R2689 Other abnormalities of gait and mobility: Secondary | ICD-10-CM | POA: Diagnosis not present

## 2019-03-08 DIAGNOSIS — I252 Old myocardial infarction: Secondary | ICD-10-CM | POA: Diagnosis not present

## 2019-03-08 DIAGNOSIS — I69218 Other symptoms and signs involving cognitive functions following other nontraumatic intracranial hemorrhage: Secondary | ICD-10-CM | POA: Diagnosis not present

## 2019-03-08 DIAGNOSIS — R531 Weakness: Secondary | ICD-10-CM | POA: Diagnosis not present

## 2019-03-08 DIAGNOSIS — I13 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 failure and stage 1 through stage 4 chronic kidney disease, or unspecified chronic kidney disease: Secondary | ICD-10-CM | POA: Diagnosis not present

## 2019-03-08 DIAGNOSIS — I25118 Atherosclerotic heart disease of native coronary artery with other forms of angina pectoris: Secondary | ICD-10-CM | POA: Diagnosis not present

## 2019-03-08 DIAGNOSIS — I69293 Ataxia following other nontraumatic intracranial hemorrhage: Secondary | ICD-10-CM | POA: Diagnosis not present

## 2019-03-08 DIAGNOSIS — I69298 Other sequelae of other nontraumatic intracranial hemorrhage: Secondary | ICD-10-CM | POA: Diagnosis not present

## 2019-03-08 DIAGNOSIS — I5030 Unspecified diastolic (congestive) heart failure: Secondary | ICD-10-CM | POA: Diagnosis not present

## 2019-03-09 ENCOUNTER — Other Ambulatory Visit: Payer: Self-pay

## 2019-03-09 ENCOUNTER — Emergency Department (HOSPITAL_COMMUNITY)
Admission: EM | Admit: 2019-03-09 | Discharge: 2019-03-09 | Disposition: A | Discharge status: Home / Self Care | Payer: Medicare HMO | Attending: Emergency Medicine | Admitting: Emergency Medicine

## 2019-03-09 ENCOUNTER — Emergency Department (HOSPITAL_COMMUNITY): Payer: Medicare HMO

## 2019-03-09 ENCOUNTER — Encounter (HOSPITAL_COMMUNITY): Payer: Self-pay | Admitting: Emergency Medicine

## 2019-03-09 DIAGNOSIS — W19XXXA Unspecified fall, initial encounter: Secondary | ICD-10-CM | POA: Diagnosis not present

## 2019-03-09 DIAGNOSIS — Z7901 Long term (current) use of anticoagulants: Secondary | ICD-10-CM | POA: Diagnosis not present

## 2019-03-09 DIAGNOSIS — Z79899 Other long term (current) drug therapy: Secondary | ICD-10-CM | POA: Insufficient documentation

## 2019-03-09 DIAGNOSIS — S32010A Wedge compression fracture of first lumbar vertebra, initial encounter for closed fracture: Secondary | ICD-10-CM | POA: Diagnosis not present

## 2019-03-09 DIAGNOSIS — I13 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 failure and stage 1 through stage 4 chronic kidney disease, or unspecified chronic kidney disease: Secondary | ICD-10-CM | POA: Diagnosis not present

## 2019-03-09 DIAGNOSIS — I2510 Atherosclerotic heart disease of native coronary artery without angina pectoris: Secondary | ICD-10-CM | POA: Diagnosis not present

## 2019-03-09 DIAGNOSIS — S3993XA Unspecified injury of pelvis, initial encounter: Secondary | ICD-10-CM | POA: Diagnosis not present

## 2019-03-09 DIAGNOSIS — S3992XA Unspecified injury of lower back, initial encounter: Secondary | ICD-10-CM | POA: Diagnosis present

## 2019-03-09 DIAGNOSIS — I509 Heart failure, unspecified: Secondary | ICD-10-CM | POA: Diagnosis not present

## 2019-03-09 DIAGNOSIS — M545 Low back pain: Secondary | ICD-10-CM | POA: Diagnosis not present

## 2019-03-09 DIAGNOSIS — M25552 Pain in left hip: Secondary | ICD-10-CM | POA: Diagnosis not present

## 2019-03-09 DIAGNOSIS — N183 Chronic kidney disease, stage 3 unspecified: Secondary | ICD-10-CM | POA: Insufficient documentation

## 2019-03-09 DIAGNOSIS — J449 Chronic obstructive pulmonary disease, unspecified: Secondary | ICD-10-CM | POA: Diagnosis not present

## 2019-03-09 DIAGNOSIS — Z951 Presence of aortocoronary bypass graft: Secondary | ICD-10-CM | POA: Insufficient documentation

## 2019-03-09 DIAGNOSIS — Y939 Activity, unspecified: Secondary | ICD-10-CM | POA: Diagnosis not present

## 2019-03-09 DIAGNOSIS — S32019A Unspecified fracture of first lumbar vertebra, initial encounter for closed fracture: Secondary | ICD-10-CM | POA: Diagnosis not present

## 2019-03-09 DIAGNOSIS — I4891 Unspecified atrial fibrillation: Secondary | ICD-10-CM | POA: Insufficient documentation

## 2019-03-09 DIAGNOSIS — S0990XA Unspecified injury of head, initial encounter: Secondary | ICD-10-CM | POA: Diagnosis not present

## 2019-03-09 DIAGNOSIS — Y929 Unspecified place or not applicable: Secondary | ICD-10-CM | POA: Diagnosis not present

## 2019-03-09 DIAGNOSIS — Y999 Unspecified external cause status: Secondary | ICD-10-CM | POA: Insufficient documentation

## 2019-03-09 DIAGNOSIS — S199XXA Unspecified injury of neck, initial encounter: Secondary | ICD-10-CM | POA: Diagnosis not present

## 2019-03-09 MED ORDER — ACETAMINOPHEN 325 MG PO TABS
650.0000 mg | ORAL_TABLET | Freq: Once | ORAL | Status: AC
  Administered 2019-03-09: 650 mg via ORAL
  Filled 2019-03-09: qty 2

## 2019-03-09 MED ORDER — LIDOCAINE 5 % EX PTCH: 1.0000 | MEDICATED_PATCH | CUTANEOUS | 0 refills | Status: DC

## 2019-03-09 MED ORDER — LIDOCAINE 5 % EX PTCH
1.0000 | MEDICATED_PATCH | CUTANEOUS | Status: DC
  Administered 2019-03-09: 1 via TRANSDERMAL
  Filled 2019-03-09: qty 1

## 2019-03-09 NOTE — ED Triage Notes (Signed)
Pt's daughter states pt fell Thursday and hit left side of head and left hip. Noted blood on his left hearing aid and was concerned. Pt has been ambulatory since falling and states more his back hurting.

## 2019-03-09 NOTE — ED Provider Notes (Signed)
[facility] EMERGENCY DEPARTMENT Provider Note   CSN: [id number] Arrival date & time: 03/09/19  1124     History   Chief Complaint Chief Complaint  Patient presents with   Fall    HPI Evan [name] is a 85 y.o. male.     HPI    85 year old male, with a PMH of "a brain bleed" with residual right-sided deficits, CAD on Xarelto, presents 3 days status post fall.  Per daughter at bedside patient was walking to the commode with his walker when his right leg "gave out."  Daughter states that patient has had an issue with this since April when he was diagnosed with a brain bleed.  She states that often times he loses his balance.  Patient endorsed that he is having a toilet.  Per family bedside he has been complaining of left hip and low back pain.  Patient is only complaining of low back pain to the provider.  No LOC noted.  Family states he has been acting himself since the fall.  Past Medical History:  Diagnosis Date   Adenocarcinoma of right lung ([place]) 01/06/2016   Anginal chest pain at rest [facility])    Chronic non, controlled on Lorazepam   Anxiety    Arthritis    "all over"    BPH (benign prostatic hyperplasia)    CAD (coronary artery disease)    Chronic lower back pain    Complication of anesthesia    "problems making water afterwards"   Depression    DJD (degenerative joint disease)    Esophageal ulcer without bleeding    bid ppi indefinitely   Family history of adverse reaction to anesthesia    "children w/PONV"   GERD (gastroesophageal reflux disease)    Headache    "couple/week maybe" (09/04/2015)   Hemochromatosis    Possible, elevated iron stores, hook-like osteophytes on hand films, normal LFTs   Hepatitis    "yellow jaundice as a baby"   History of ASCVD    MULTIVESSEL   Hyperlipidemia    Hypertension    Osteoarthritis    Pneumonia    "several times; got a little now" (09/04/2015)   Rheumatoid arthritis ([place])    Subdural hematoma ([place])     small after fall 09/2016-plavix held, neurosurgery consulted.  Asymptomatic.     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   on Lovenox lifelong since he failed PO Eliquis    Patient Active Problem List   Diagnosis Date Noted   Chronic right shoulder pain 12/19/2018   Weakness generalized 09/28/2018   Palliative care encounter 09/28/2018   Pressure injury of skin 08/27/2018   COPD (chronic obstructive pulmonary disease) ([place]) 08/26/2018   AMS (altered mental status) 08/22/2018   Altered mental status 08/21/2018   UTI (urinary tract infection) 08/21/2018   Abnormality of gait 08/20/2018   Prolonged Q-T interval on ECG    Dysphagia, post-stroke    Supplemental oxygen dependent    Anxiety state    Fatigue    SOB (shortness of breath)    Hyperglycemia    History of lung cancer    Depression    CKD (chronic kidney disease), stage III    Acute blood loss anemia    Fall 07/16/2018   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   Acute spontaneous 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 ([place]) L thalamic 07/14/2018   Cellulitis of arm, right 06/01/2018   Rash and nonspecific skin eruption 06/01/2018   Chest pain 02/02/2018  Thrombocytopenia ([place]) 02/02/2018   Hypokalemia 02/02/2018   Pulmonary embolism ([place]) 02/02/2018   Elevated troponin 02/02/2018   Pulmonary embolus ([place]) 02/02/2018   Non-ST elevation MI (NSTEMI) ([place]) 56/81/2751   Diastolic CHF ([place]) 70/04/7492   Chest pain due to CAD ([place]) 06/26/2017   Dyspnea 06/21/2017   Acute bronchitis 05/15/2017   Oral candidiasis 05/15/2017   Acute pericarditis    Chest pain at rest 12/20/2016   Angina pectoris ([place]) 12/20/2016   Radiation pneumonitis ([place]) 11/09/2016   Pleurisy 10/11/2016   Chest wall pain 10/11/2016   Atrial fibrillation ([place]) 05/21/2016   Adenocarcinoma of right lung ([place]) 01/06/2016   GAD (generalized anxiety  disorder) 11/20/2015   Essential hypertension    Cellulitis 09/04/2015   Cellulitis of left axilla    Esophageal ulcer without bleeding    Bilateral lower extremity edema 04/28/2014   BPH (benign prostatic hyperplasia) 10/08/2012   Anxiety    EKG abnormalities 09/05/2012   Tremor 09/05/2012   Chronic fatigue 09/05/2012   Arthritis    Asthma, chronic    Reflux esophagitis    S/P CABG (coronary artery bypass graft)    GERD (gastroesophageal reflux disease)    Hyperlipidemia    Hypertension    UNSPECIFIED PERIPHERAL VASCULAR DISEASE 06/16/2009    Past Surgical History:  Procedure Laterality Date   ARM SKIN LESION BIOPSY / EXCISION Left 09/02/2015   CATARACT EXTRACTION W/ INTRAOCULAR LENS  IMPLANT, BILATERAL Bilateral    CHOLECYSTECTOMY OPEN     CORNEAL TRANSPLANT Bilateral    "one at [facility]; one at [facility]"   [place]     DE stent ostium into the right radial free graft at OM-, 02-2007   ESOPHAGOGASTRODUODENOSCOPY N/A 11/09/2014   Procedure: ESOPHAGOGASTRODUODENOSCOPY (EGD);  Surgeon: [name], MD;  Location: [facility] ENDOSCOPY;  Service: Endoscopy;  Laterality: N/A;   INGUINAL HERNIA REPAIR     JOINT REPLACEMENT     LEFT HEART CATH AND CORS/GRAFTS ANGIOGRAPHY N/A 06/27/2017   Procedure: LEFT HEART CATH AND CORS/GRAFTS ANGIOGRAPHY;  Surgeon: [name], MD;  Location: [place] CV LAB;  Service: Cardiovascular;  Laterality: N/A;   NASAL SINUS SURGERY     SHOULDER OPEN ROTATOR CUFF REPAIR Right    TOTAL KNEE ARTHROPLASTY Bilateral         Home Medications    Prior to Admission medications   Medication Sig Start Date End Date Taking? Authorizing Provider  acetaminophen (TYLENOL) 325 MG tablet Take 1-2 tablets (325-650 mg total) by mouth every 4 (four) hours as needed for mild pain. 08/09/18   Love, Ivan Anchors, PA-C  albuterol  (VENTOLIN HFA) 108 (90 Base) MCG/ACT inhaler Inhale 2 puffs into the lungs every 4 (four) hours as needed for up to 14 days for wheezing or shortness of breath. 11/05/18 02/18/20  Noemi Chapel, MD  ALPRAZolam Duanne Moron) 0.5 MG tablet Take 0.5 tablets (0.25 mg total) by mouth 3 (three) times daily as needed for anxiety. 08/31/18   [name], MD  camphor-menthol [facility]) lotion Apply 1 application topically daily as needed for itching. 08/24/18   Aline August, MD  diclofenac sodium (VOLTAREN) 1 % GEL Apply 2 g topically 4 (four) times daily. 12/19/18   Jamse Arn, MD  FLUoxetine (PROZAC) 20 MG capsule Take 20 mg by mouth daily.    [provider]  furosemide (LASIX) 20 MG tablet Take 1  tablet (20 mg total) by mouth daily. 08/14/18   Angiulli, Lavon Paganini, PA-C  gabapentin (NEURONTIN) 100 MG capsule Take 1 capsule (100 mg total) by mouth 3 (three) times daily. 01/04/19   [name], MD  Lifitegrast Shirley Friar) 5 % SOLN Place 1-2 drops into both eyes daily.     [provider]  magnesium oxide (MAG-OX) 400 (241.3 Mg) MG tablet Take 1 tablet (400 mg total) by mouth 2 (two) times daily. 08/14/18   Angiulli, Lavon Paganini, PA-C  metoprolol tartrate (LOPRESSOR) 25 MG tablet Take 0.5 tablets (12.5 mg total) by mouth 2 (two) times daily. 10/04/18   [name], MD  neomycin-polymyxin-hydrocortisone (CORTISPORIN) OTIC solution Place 4 drops into both ears 4 (four) times daily. Patient not taking: Reported on 02/22/2019 01/04/19   [name], MD  nitroGLYCERIN (NITROSTAT) 0.4 MG SL tablet Place 1 tablet (0.4 mg total) under the tongue every 5 (five) minutes as needed for chest pain. 12/06/17   Daune Perch, NP  polyethylene glycol (MIRALAX / GLYCOLAX) 17 g packet Take 17 g by mouth daily as needed for mild constipation. 08/14/18   Angiulli, Lavon Paganini, PA-C  Rivaroxaban (XARELTO) 15 MG TABS tablet Take 1 tablet (15 mg total) by mouth daily with supper. 12/27/18   Jettie Booze, MD   rosuvastatin (CRESTOR) 5 MG tablet Take 1 tablet (5 mg total) by mouth daily at 6 PM. 08/14/18   Angiulli, Lavon Paganini, PA-C  silodosin (RAPAFLO) 8 MG CAPS capsule Take 8 mg by mouth daily with breakfast.    [provider]  sulfamethoxazole-trimethoprim (BACTRIM DS) 800-160 MG tablet Take 1 tablet by mouth 2 (two) times daily. 02/19/19   [name], MD    Family History Family History  Problem Relation Age of Onset   Arthritis-Osteo Sister    Heart attack Brother    Heart attack Other    Cancer Neg Hx     Social History Social History   Tobacco Use   Smoking status: Never Smoker   Smokeless tobacco: Former Systems developer    Types: Chew   Tobacco comment: "quit chewing in the 1960s"  Substance Use Topics   Alcohol use: No   Drug use: No     Allergies   Feldene [piroxicam], Neomycin-polymyxin-hc, Statins, Doxycycline, Latex, Tequin, and Valium   Review of Systems Review of Systems  Constitutional: Negative for chills and fever.  HENT: Negative for rhinorrhea and sore throat.   Eyes: Negative for visual disturbance.  Respiratory: Negative for cough and shortness of breath.   Cardiovascular: Negative for chest pain and leg swelling.  Gastrointestinal: Negative for abdominal pain, diarrhea, nausea and vomiting.  Genitourinary: Negative for dysuria, frequency and urgency.  Musculoskeletal: Positive for arthralgias and back pain. Negative for joint swelling and neck pain.  Skin: Positive for wound (blood in left ear canal).  Neurological: Negative for syncope, light-headedness, numbness and headaches.  All other systems reviewed and are negative.    Physical Exam Updated Vital Signs BP 133/86 (BP Location: Left Arm)    Pulse 64    Temp 97.6 F (36.4 C) (Oral)    Resp 18    Ht 5\' 7"  (1.702 m)    Wt 79.4 kg    SpO2 100%    BMI 27.41 kg/m   Physical Exam Vitals signs and nursing note reviewed.  Constitutional:      Appearance: He is well-developed.  HENT:      Head: Normocephalic and atraumatic.  Right Ear: Tympanic membrane normal.     Left Ear: Tympanic membrane normal. No hemotympanum.     Ears:     Comments: Some dried blood noted in the left ear canal.  Visible TM with no hemotympanum.    Nose: Nose normal.  Eyes:     Extraocular Movements: Extraocular movements intact.     Conjunctiva/sclera: Conjunctivae normal.     Pupils: Pupils are equal, round, and reactive to light.  Neck:     Musculoskeletal: Neck supple.  Cardiovascular:     Rate and Rhythm: Normal rate and regular rhythm.     Heart sounds: Normal heart sounds. No murmur.  Pulmonary:     Effort: Pulmonary effort is normal. No respiratory distress.     Breath sounds: Normal breath sounds. No wheezing or rales.  Abdominal:     General: Bowel sounds are normal. There is no distension.     Palpations: Abdomen is soft.     Tenderness: There is no abdominal tenderness.  Musculoskeletal: Normal range of motion.        General: No tenderness or deformity.     Right hip: Normal.     Left hip: Normal.     Lumbar back: He exhibits bony tenderness.  Skin:    General: Skin is warm and dry.     Findings: No erythema or rash.       Neurological:     Mental Status: He is alert and oriented to person, place, and time.     Cranial Nerves: Cranial nerves are intact.     Motor: Weakness (right upper and lower, chronic) present.  Psychiatric:        Behavior: Behavior normal.      ED Treatments / Results  Labs (all labs ordered are listed, but only abnormal results are displayed) Labs Reviewed - No data to display  EKG None  Radiology No results found.  Procedures Procedures (including critical care time)  Medications Ordered in ED Medications - No data to display   Initial Impression / Assessment and Plan / ED Course  I have reviewed the triage vital signs and the nursing notes.  Pertinent labs & imaging results that were available during my care of the  patient were reviewed by me and considered in my medical decision making (see chart for details).        Presents 3 days status post fall.  His physical exam shows some dried blood in the right ear canal.  Patient moving bilateral lower extremities and upper extremities without difficulty.  He is mildly tender over the lower lumbar spine at approximately L4.  He has been walking at home without difficulty.  Family and patient deny any bowel or bladder incontinence, saddle anesthesias.  Skin tear noted to the right hand.  He has full range of motion of the hands, denies any pain.  Patient was offered tetanus booster and he declined.   He received a CT head and neck which showed no acute intracranial abnormality or cervical spine fracture.  He had pelvis x-ray which shows no evidence of fracture and lumbar x-ray which shows possible acute fracture.  He received a CT of the lumbar spine which showed acute fracture.  I discussed with patient and family and they declined any narcotics for home.  We will give lidocaine patches.  Encouraged follow-up with neurosurgery.  Given return precautions.  Patient ready and stable for discharge.   At this time there does not appear to be any  evidence of an acute emergency medical condition and the patient appears stable for discharge with appropriate outpatient follow up.Diagnosis was discussed with patient who verbalizes understanding and is agreeable to discharge. Pt case discussed with Dr. Roderic Palau who agrees with my plan.    Final Clinical Impressions(s) / ED Diagnoses   Final diagnoses:  Fall    ED Discharge Orders    None       Rachel Moulds 03/09/19 2134    Milton Ferguson, MD 03/10/19 [id number]

## 2019-03-09 NOTE — Discharge Instructions (Signed)
Take Tylenol or apply lidocaine patch as needed for pain.  Please follow-up with neurosurgery regarding your fracture of L1.  Return to the ED immediately for any worsening symptoms or concerns, such as difficulty urinating, numbness of the groin, new or worsening pain, weakness or any concerns at all.

## 2019-03-11 ENCOUNTER — Other Ambulatory Visit: Payer: Self-pay | Admitting: Family Medicine

## 2019-03-11 ENCOUNTER — Telehealth: Payer: Self-pay | Admitting: Family Medicine

## 2019-03-11 DIAGNOSIS — I69298 Other sequelae of other nontraumatic intracranial hemorrhage: Secondary | ICD-10-CM | POA: Diagnosis not present

## 2019-03-11 DIAGNOSIS — I69218 Other symptoms and signs involving cognitive functions following other nontraumatic intracranial hemorrhage: Secondary | ICD-10-CM | POA: Diagnosis not present

## 2019-03-11 DIAGNOSIS — I252 Old myocardial infarction: Secondary | ICD-10-CM | POA: Diagnosis not present

## 2019-03-11 DIAGNOSIS — R2689 Other abnormalities of gait and mobility: Secondary | ICD-10-CM | POA: Diagnosis not present

## 2019-03-11 DIAGNOSIS — I13 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 failure and stage 1 through stage 4 chronic kidney disease, or unspecified chronic kidney disease: Secondary | ICD-10-CM | POA: Diagnosis not present

## 2019-03-11 DIAGNOSIS — R531 Weakness: Secondary | ICD-10-CM | POA: Diagnosis not present

## 2019-03-11 DIAGNOSIS — I25118 Atherosclerotic heart disease of native coronary artery with other forms of angina pectoris: Secondary | ICD-10-CM | POA: Diagnosis not present

## 2019-03-11 DIAGNOSIS — I5030 Unspecified diastolic (congestive) heart failure: Secondary | ICD-10-CM | POA: Diagnosis not present

## 2019-03-11 DIAGNOSIS — I69293 Ataxia following other nontraumatic intracranial hemorrhage: Secondary | ICD-10-CM | POA: Diagnosis not present

## 2019-03-11 MED ORDER — HYDROCODONE-ACETAMINOPHEN 5-325 MG PO TABS: 1.0000 | ORAL_TABLET | Freq: Four times a day (QID) | ORAL | 0 refills | Status: DC | PRN

## 2019-03-11 NOTE — Telephone Encounter (Signed)
[facility] nurse called and states that the pt was sent home on a Lidoderm patch for pain and he is having a reaction to it. Leaving skin fire red and itchy. He rates his pain 10/10 and they were just giving him Tylenol for the pain. Please advise if there is something else we can give him for pain.   Also hosp informed them that he needs to see [place] for f/u. When would you like to see him or do a phone call?

## 2019-03-11 NOTE — Telephone Encounter (Signed)
Evan [name] aware and per [name] no need for ov unless they need something. There is nothing further that can be done just pain management. She is appreciative of medication and will call if they need anything.

## 2019-03-11 NOTE — Telephone Encounter (Signed)
I will send in norco 5/325, 1 poq4 hrs prn pain.

## 2019-03-13 DIAGNOSIS — I69298 Other sequelae of other nontraumatic intracranial hemorrhage: Secondary | ICD-10-CM | POA: Diagnosis not present

## 2019-03-13 DIAGNOSIS — I69218 Other symptoms and signs involving cognitive functions following other nontraumatic intracranial hemorrhage: Secondary | ICD-10-CM | POA: Diagnosis not present

## 2019-03-13 DIAGNOSIS — I252 Old myocardial infarction: Secondary | ICD-10-CM | POA: Diagnosis not present

## 2019-03-13 DIAGNOSIS — I13 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 failure and stage 1 through stage 4 chronic kidney disease, or unspecified chronic kidney disease: Secondary | ICD-10-CM | POA: Diagnosis not present

## 2019-03-13 DIAGNOSIS — R2689 Other abnormalities of gait and mobility: Secondary | ICD-10-CM | POA: Diagnosis not present

## 2019-03-13 DIAGNOSIS — R531 Weakness: Secondary | ICD-10-CM | POA: Diagnosis not present

## 2019-03-13 DIAGNOSIS — I5030 Unspecified diastolic (congestive) heart failure: Secondary | ICD-10-CM | POA: Diagnosis not present

## 2019-03-13 DIAGNOSIS — I69293 Ataxia following other nontraumatic intracranial hemorrhage: Secondary | ICD-10-CM | POA: Diagnosis not present

## 2019-03-13 DIAGNOSIS — I25118 Atherosclerotic heart disease of native coronary artery with other forms of angina pectoris: Secondary | ICD-10-CM | POA: Diagnosis not present

## 2019-03-14 DIAGNOSIS — R030 Elevated blood-pressure reading, without diagnosis of hypertension: Secondary | ICD-10-CM | POA: Diagnosis not present

## 2019-03-14 DIAGNOSIS — S32010A Wedge compression fracture of first lumbar vertebra, initial encounter for closed fracture: Secondary | ICD-10-CM | POA: Diagnosis not present

## 2019-03-14 DIAGNOSIS — S32000A Wedge compression fracture of unspecified lumbar vertebra, initial encounter for closed fracture: Secondary | ICD-10-CM | POA: Insufficient documentation

## 2019-03-16 DIAGNOSIS — D689 Coagulation defect, unspecified: Secondary | ICD-10-CM | POA: Diagnosis not present

## 2019-03-16 DIAGNOSIS — R531 Weakness: Secondary | ICD-10-CM | POA: Diagnosis not present

## 2019-03-16 DIAGNOSIS — I5030 Unspecified diastolic (congestive) heart failure: Secondary | ICD-10-CM | POA: Diagnosis not present

## 2019-03-16 DIAGNOSIS — R2689 Other abnormalities of gait and mobility: Secondary | ICD-10-CM | POA: Diagnosis not present

## 2019-03-16 DIAGNOSIS — I252 Old myocardial infarction: Secondary | ICD-10-CM | POA: Diagnosis not present

## 2019-03-16 DIAGNOSIS — I69218 Other symptoms and signs involving cognitive functions following other nontraumatic intracranial hemorrhage: Secondary | ICD-10-CM | POA: Diagnosis not present

## 2019-03-16 DIAGNOSIS — I610 Nontraumatic intracerebral hemorrhage in hemisphere, subcortical: Secondary | ICD-10-CM | POA: Diagnosis not present

## 2019-03-16 DIAGNOSIS — I619 Nontraumatic intracerebral hemorrhage, unspecified: Secondary | ICD-10-CM | POA: Diagnosis not present

## 2019-03-16 DIAGNOSIS — I13 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 failure and stage 1 through stage 4 chronic kidney disease, or unspecified chronic kidney disease: Secondary | ICD-10-CM | POA: Diagnosis not present

## 2019-03-16 DIAGNOSIS — I69298 Other sequelae of other nontraumatic intracranial hemorrhage: Secondary | ICD-10-CM | POA: Diagnosis not present

## 2019-03-16 DIAGNOSIS — I69293 Ataxia following other nontraumatic intracranial hemorrhage: Secondary | ICD-10-CM | POA: Diagnosis not present

## 2019-03-16 DIAGNOSIS — I25118 Atherosclerotic heart disease of native coronary artery with other forms of angina pectoris: Secondary | ICD-10-CM | POA: Diagnosis not present

## 2019-03-17 DIAGNOSIS — I610 Nontraumatic intracerebral hemorrhage in hemisphere, subcortical: Secondary | ICD-10-CM | POA: Diagnosis not present

## 2019-03-17 DIAGNOSIS — D689 Coagulation defect, unspecified: Secondary | ICD-10-CM | POA: Diagnosis not present

## 2019-03-17 DIAGNOSIS — I619 Nontraumatic intracerebral hemorrhage, unspecified: Secondary | ICD-10-CM | POA: Diagnosis not present

## 2019-03-19 DIAGNOSIS — R2689 Other abnormalities of gait and mobility: Secondary | ICD-10-CM | POA: Diagnosis not present

## 2019-03-19 DIAGNOSIS — I69293 Ataxia following other nontraumatic intracranial hemorrhage: Secondary | ICD-10-CM | POA: Diagnosis not present

## 2019-03-19 DIAGNOSIS — I252 Old myocardial infarction: Secondary | ICD-10-CM | POA: Diagnosis not present

## 2019-03-19 DIAGNOSIS — R531 Weakness: Secondary | ICD-10-CM | POA: Diagnosis not present

## 2019-03-19 DIAGNOSIS — I5030 Unspecified diastolic (congestive) heart failure: Secondary | ICD-10-CM | POA: Diagnosis not present

## 2019-03-19 DIAGNOSIS — I69218 Other symptoms and signs involving cognitive functions following other nontraumatic intracranial hemorrhage: Secondary | ICD-10-CM | POA: Diagnosis not present

## 2019-03-19 DIAGNOSIS — I25118 Atherosclerotic heart disease of native coronary artery with other forms of angina pectoris: Secondary | ICD-10-CM | POA: Diagnosis not present

## 2019-03-19 DIAGNOSIS — I69298 Other sequelae of other nontraumatic intracranial hemorrhage: Secondary | ICD-10-CM | POA: Diagnosis not present

## 2019-03-19 DIAGNOSIS — I13 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 failure and stage 1 through stage 4 chronic kidney disease, or unspecified chronic kidney disease: Secondary | ICD-10-CM | POA: Diagnosis not present

## 2019-03-20 DIAGNOSIS — I69298 Other sequelae of other nontraumatic intracranial hemorrhage: Secondary | ICD-10-CM | POA: Diagnosis not present

## 2019-03-20 DIAGNOSIS — I69218 Other symptoms and signs involving cognitive functions following other nontraumatic intracranial hemorrhage: Secondary | ICD-10-CM | POA: Diagnosis not present

## 2019-03-20 DIAGNOSIS — I25118 Atherosclerotic heart disease of native coronary artery with other forms of angina pectoris: Secondary | ICD-10-CM | POA: Diagnosis not present

## 2019-03-20 DIAGNOSIS — I69293 Ataxia following other nontraumatic intracranial hemorrhage: Secondary | ICD-10-CM | POA: Diagnosis not present

## 2019-03-20 DIAGNOSIS — I252 Old myocardial infarction: Secondary | ICD-10-CM | POA: Diagnosis not present

## 2019-03-20 DIAGNOSIS — I13 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 failure and stage 1 through stage 4 chronic kidney disease, or unspecified chronic kidney disease: Secondary | ICD-10-CM | POA: Diagnosis not present

## 2019-03-20 DIAGNOSIS — R531 Weakness: Secondary | ICD-10-CM | POA: Diagnosis not present

## 2019-03-20 DIAGNOSIS — I5030 Unspecified diastolic (congestive) heart failure: Secondary | ICD-10-CM | POA: Diagnosis not present

## 2019-03-20 DIAGNOSIS — R2689 Other abnormalities of gait and mobility: Secondary | ICD-10-CM | POA: Diagnosis not present

## 2019-03-21 ENCOUNTER — Other Ambulatory Visit: Payer: Medicare HMO | Admitting: Nurse Practitioner

## 2019-03-21 ENCOUNTER — Encounter: Payer: Medicare HMO | Attending: Physical Medicine & Rehabilitation | Admitting: Physical Medicine & Rehabilitation

## 2019-03-21 ENCOUNTER — Encounter: Payer: Self-pay | Admitting: Physical Medicine & Rehabilitation

## 2019-03-21 ENCOUNTER — Encounter: Payer: Self-pay | Admitting: Nurse Practitioner

## 2019-03-21 ENCOUNTER — Other Ambulatory Visit: Payer: Self-pay

## 2019-03-21 VITALS — BP 99/64 | HR 80 | Temp 97.8°F | Ht 64.0 in | Wt 175.0 lb

## 2019-03-21 DIAGNOSIS — Z8261 Family history of arthritis: Secondary | ICD-10-CM | POA: Insufficient documentation

## 2019-03-21 DIAGNOSIS — L89609 Pressure ulcer of unspecified heel, unspecified stage: Secondary | ICD-10-CM | POA: Diagnosis not present

## 2019-03-21 DIAGNOSIS — Z85118 Personal history of other malignant neoplasm of bronchus and lung: Secondary | ICD-10-CM | POA: Diagnosis not present

## 2019-03-21 DIAGNOSIS — Z9049 Acquired absence of other specified parts of digestive tract: Secondary | ICD-10-CM | POA: Insufficient documentation

## 2019-03-21 DIAGNOSIS — Z955 Presence of coronary angioplasty implant and graft: Secondary | ICD-10-CM | POA: Insufficient documentation

## 2019-03-21 DIAGNOSIS — I2510 Atherosclerotic heart disease of native coronary artery without angina pectoris: Secondary | ICD-10-CM | POA: Diagnosis not present

## 2019-03-21 DIAGNOSIS — R600 Localized edema: Secondary | ICD-10-CM | POA: Diagnosis not present

## 2019-03-21 DIAGNOSIS — I25709 Atherosclerosis of coronary artery bypass graft(s), unspecified, with unspecified angina pectoris: Secondary | ICD-10-CM | POA: Diagnosis not present

## 2019-03-21 DIAGNOSIS — I610 Nontraumatic intracerebral hemorrhage in hemisphere, subcortical: Secondary | ICD-10-CM | POA: Diagnosis not present

## 2019-03-21 DIAGNOSIS — F329 Major depressive disorder, single episode, unspecified: Secondary | ICD-10-CM | POA: Diagnosis not present

## 2019-03-21 DIAGNOSIS — Z947 Corneal transplant status: Secondary | ICD-10-CM | POA: Insufficient documentation

## 2019-03-21 DIAGNOSIS — R350 Frequency of micturition: Secondary | ICD-10-CM

## 2019-03-21 DIAGNOSIS — R269 Unspecified abnormalities of gait and mobility: Secondary | ICD-10-CM

## 2019-03-21 DIAGNOSIS — Z951 Presence of aortocoronary bypass graft: Secondary | ICD-10-CM | POA: Insufficient documentation

## 2019-03-21 DIAGNOSIS — Z515 Encounter for palliative care: Secondary | ICD-10-CM

## 2019-03-21 DIAGNOSIS — F419 Anxiety disorder, unspecified: Secondary | ICD-10-CM | POA: Insufficient documentation

## 2019-03-21 DIAGNOSIS — M545 Low back pain: Secondary | ICD-10-CM | POA: Insufficient documentation

## 2019-03-21 DIAGNOSIS — G8929 Other chronic pain: Secondary | ICD-10-CM | POA: Diagnosis not present

## 2019-03-21 DIAGNOSIS — J700 Acute pulmonary manifestations due to radiation: Secondary | ICD-10-CM | POA: Insufficient documentation

## 2019-03-21 DIAGNOSIS — R4189 Other symptoms and signs involving cognitive functions and awareness: Secondary | ICD-10-CM | POA: Insufficient documentation

## 2019-03-21 DIAGNOSIS — M069 Rheumatoid arthritis, unspecified: Secondary | ICD-10-CM | POA: Diagnosis not present

## 2019-03-21 DIAGNOSIS — M79662 Pain in left lower leg: Secondary | ICD-10-CM | POA: Insufficient documentation

## 2019-03-21 DIAGNOSIS — R1310 Dysphagia, unspecified: Secondary | ICD-10-CM | POA: Insufficient documentation

## 2019-03-21 DIAGNOSIS — Z96653 Presence of artificial knee joint, bilateral: Secondary | ICD-10-CM | POA: Insufficient documentation

## 2019-03-21 DIAGNOSIS — I2699 Other pulmonary embolism without acute cor pulmonale: Secondary | ICD-10-CM | POA: Diagnosis not present

## 2019-03-21 DIAGNOSIS — H9193 Unspecified hearing loss, bilateral: Secondary | ICD-10-CM | POA: Diagnosis not present

## 2019-03-21 DIAGNOSIS — I10 Essential (primary) hypertension: Secondary | ICD-10-CM | POA: Insufficient documentation

## 2019-03-21 DIAGNOSIS — Z8249 Family history of ischemic heart disease and other diseases of the circulatory system: Secondary | ICD-10-CM | POA: Insufficient documentation

## 2019-03-21 DIAGNOSIS — M1990 Unspecified osteoarthritis, unspecified site: Secondary | ICD-10-CM | POA: Insufficient documentation

## 2019-03-21 NOTE — Progress Notes (Signed)
[facility] Consult Note Telephone: [phone]  Fax: [phone]  PATIENT NAME: Evan [name] DOB: 08-31-1927 MRN: [id number]  PRIMARY CARE PROVIDER:   [name], MD  REFERRING PROVIDER:  [name], MD [address] [place],  [place] [address] RESPONSIBLE PARTY:Self; Lando Alcalde [phone] or [phone]  Due to the COVID-19 crisis, this visit was done via telemedicine from my office and it was initiated and consent by this patient and or family.  RECOMMENDATIONS and PLAN: 1.ACP: DNR; Will put in Epic/Vynca; Will revisit MOST form and [place] book at scheduled next palliative care visit.  2.Generalized weaknesssecondary tocompression L4 fx. right lung 01/2016, history of radiation pneumonitis, pleurisy, COPD, coronary artery disease s/p cabg, diastolic congestive heart failure, atrial fibrillation,myocardialinfarction,pulmonary embolus,history of acute pericarditis, angina chest pain at rest chronic, artery disease, ascvd, descending aortic aneurysm repair with stent, peripheral vascular disease, pulmonary embolus, subdural hematoma Encourage energy conservation and rest times,continue with therapy as able; fall risk  3.Palliative care encounter ; Palliative medicine team will continue to support patient, patient's family, and medical team. Visit consisted of counseling and education dealing with the complex and emotionally intense issues of symptom management and palliative care in the setting of serious and potentially life-threatening illness  I spent 35 minutes providing this consultation,  from 2:00pm to 2:35pm. More than 50% of the time in this consultation was spent coordinating communication.   HISTORY OF PRESENT ILLNESS:  Evan [name] is a 85 y.o. 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 Seen in the emergency department at [facility] 11 / 13 / 2020 for right ankle pain and swelling suggestive of cellulitis and treated with antibiotic therapy. Seen in the emergency department at [facility] on 12 / 5 / 2020 for fall resulting in compression fracture L4. Scheduled palliative care telemedicine telephonic follow up visit. I called Evan [name] daughter Evan [name]. Evan [name] in agreement to telemedicine visit. We talked about how Mr [name] has been doing since the two visits to the emergency department. Evan [name] endorses that Evan [name] is doing better. Evan [name] improving. Evan [name] is working with physical therapy. We talked about his ability now to stand and pivot as he currently is not ambulatory. Pain has improved considerably. Evan [name] endorses that he has a good appetite and is able to feed himself. We talked about covid pandemic, social isolation. We talked about medical goals of care to treat what is treatable. We talked about role of palliative care and plan of care. Discuss follow-up visit and 1 month if needed or sooner should he declined. Evan [name] in agreement to  continue to monitor functional abilities and hopes that he returns to ambulating and pain remains controlled. Appointment schedule. Therapeutic listening and emotional support provided. Contact information provided. Questions answered to satisfaction. Palliative Care was asked  to help to continue to address goals of care.   CODE STATUS: DNR  PPS: 40% HOSPICE ELIGIBILITY/DIAGNOSIS: TBD  PAST MEDICAL HISTORY:  Past Medical History:  Diagnosis Date  . Adenocarcinoma of right lung ([place]) 01/06/2016  . Anginal chest pain at rest [facility])    Chronic non, controlled on Lorazepam  . Anxiety   . Arthritis    "all over"   . BPH (benign prostatic hyperplasia)   . CAD (coronary artery disease)   . Chronic lower back pain   . Complication of anesthesia    "problems making water afterwards"  . Depression   . DJD (degenerative joint disease)   . Esophageal ulcer without bleeding    bid ppi indefinitely  . Family history of adverse reaction to anesthesia    "children w/PONV"  . GERD (gastroesophageal reflux disease)   . Headache    "couple/week maybe" (09/04/2015)  . Hemochromatosis    Possible, elevated iron stores, hook-like osteophytes on hand films, normal LFTs  . Hepatitis    "yellow jaundice as a baby"  . History of ASCVD    MULTIVESSEL  . Hyperlipidemia   . Hypertension   . Osteoarthritis   . Pneumonia    "several times; got a little now" (09/04/2015)  . Rheumatoid arthritis ([place])   . Subdural hematoma (HCC)    small after fall 09/2016-plavix held, neurosurgery consulted.  Asymptomatic.    Marland Kitchen 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[employer]) 02/2018   on Lovenox lifelong since he failed PO Eliquis    SOCIAL HX:  Social History   Tobacco Use  . Smoking status: Never Smoker  . Smokeless tobacco: Former Systems developer    Types: Chew  . Tobacco comment: "quit chewing in the 1960s"  Substance Use Topics  . Alcohol use: No    ALLERGIES:  Allergies  Allergen Reactions  . Feldene [Piroxicam] Other (See Comments)     REACTION: Blisters  . Neomycin-Polymyxin-Hc Itching  . Statins Other (See Comments)    Myalgias  . Doxycycline Other (See Comments)    REACTION:  unknown  . Latex Rash  . Tequin Anxiety  . Valium Other (See Comments)    REACTION:  unknown     PERTINENT MEDICATIONS:  Outpatient Encounter Medications as of 03/21/2019  Medication Sig  . acetaminophen (TYLENOL) 325 MG tablet Take 1-2 tablets (325-650 mg total) by mouth every 4 (four) hours as needed for mild pain.  Marland Kitchen albuterol (VENTOLIN HFA) 108 (90 Base) MCG/ACT inhaler Inhale 2 puffs into the lungs every 4 (four) hours as needed for up to 14 days for wheezing or shortness of breath.  . ALPRAZolam (XANAX) 0.5 MG tablet Take 0.5 tablets (0.25 mg total) by mouth 3 (three) times daily as needed for anxiety.  . camphor-menthol (SARNA) lotion Apply 1 application topically daily as needed for itching.  . diclofenac sodium (VOLTAREN) 1 % GEL Apply 2 g topically 4 (four) times daily.  Marland Kitchen FLUoxetine (PROZAC) 20 MG capsule Take 20 mg by mouth daily.  . furosemide (LASIX) 20 MG tablet Take 1 tablet (20 mg total) by mouth daily.  Marland Kitchen gabapentin (NEURONTIN) 100 MG capsule Take 1 capsule (100 mg total) by mouth 3 (three) times daily.  Marland Kitchen HYDROcodone-acetaminophen (NORCO) 5-325 MG tablet Take 1 tablet by mouth every 6 (six) hours as needed for moderate pain.  Marland Kitchen lidocaine (LIDODERM) 5 % Place 1 patch onto the skin daily. Remove & Discard patch within 12 hours or as directed by MD  . Lowanda Foster Shirley Friar) 5 % SOLN Place 1-2  drops into both eyes daily.   . magnesium oxide (MAG-OX) 400 (241.3 Mg) MG tablet Take 1 tablet (400 mg total) by mouth 2 (two) times daily.  . metoprolol tartrate (LOPRESSOR) 25 MG tablet Take 0.5 tablets (12.5 mg total) by mouth 2 (two) times daily.  Marland Kitchen neomycin-polymyxin-hydrocortisone (CORTISPORIN) OTIC solution Place 4 drops into both ears 4 (four) times daily.  . nitroGLYCERIN (NITROSTAT) 0.4 MG SL tablet Place 1 tablet (0.4 mg total)  under the tongue every 5 (five) minutes as needed for chest pain.  . polyethylene glycol (MIRALAX / GLYCOLAX) 17 g packet Take 17 g by mouth daily as needed for mild constipation.  . Rivaroxaban (XARELTO) 15 MG TABS tablet Take 1 tablet (15 mg total) by mouth daily with supper.  . rosuvastatin (CRESTOR) 5 MG tablet Take 1 tablet (5 mg total) by mouth daily at 6 PM.  . silodosin (RAPAFLO) 8 MG CAPS capsule Take 8 mg by mouth daily with breakfast.  . sulfamethoxazole-trimethoprim (BACTRIM DS) 800-160 MG tablet Take 1 tablet by mouth 2 (two) times daily.  . tacrolimus (PROTOPIC) 0.1 % ointment Apply 1 application topically 2 (two) times daily.   No facility-administered encounter medications on file as of 03/21/2019.    PHYSICAL EXAM:  Deferred   Taelyr Jantz Z Colden Samaras, NP

## 2019-03-21 NOTE — Progress Notes (Signed)
Subjective:    Patient ID: Evan [name], male    DOB: 03-Jan-1928, 85 y.o.   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  Last clinic visit on 12/19/2018.  Since that time, he went to the ED x3.  Notes reviewed, for leg pain, cellulitis, and fall, most recently.  Workup showed lumbar compression fracture after fall.  Family provides history. He is still in PT. He is following up with Neuro.  Prior to fall he was doing well with mobility. He is on Prozaac. Not requiring supplemental oxygen.  He is having urinary freq at night and is following up with Urology. He had steroid injection in his right shoulder and that has improved the pain.   Pain Inventory Average Pain 8 Pain Right Now 8 My pain is aching  In the last 24 hours, has pain interfered with the following? General activity 9 Relation with others 0 Enjoyment of life 9 What TIME of day is your pain at its worst? daytime Sleep (in general) Poor  Pain is worse with: walking, bending, standing and some activites Pain improves with: rest and medication Relief from Meds: 9  Mobility use a wheelchair needs help with transfers  Function disabled: date disabled na I need assistance with the following:  dressing, bathing, toileting, meal prep, household duties and shopping  Neuro/Psych weakness numbness depression anxiety  Prior Studies Any changes since last visit?  no  Physicians involved in your care Any changes since last visit?  no Primary care Dr. Irish Lack Pulmonary-Dr. McQuaid   Family History  Problem Relation Age of Onset  . Arthritis-Osteo Sister   . Heart attack Brother   . Heart attack Other   . Cancer Neg Hx    Social History    Socioeconomic History  . Marital status: Widowed    Spouse name: Not on file  . Number of children: Not on file  . Years of education: Not on file  . Highest education level: Not on file  Occupational History  . Not on file  Tobacco Use  . Smoking status: Never Smoker  . Smokeless tobacco: Former Systems developer    Types: Chew  . Tobacco comment: "quit chewing in the 1960s"  Substance and Sexual Activity  . Alcohol use: No  . Drug use: No  . Sexual activity: Never  Other Topics Concern  . Not on file  Social History Narrative   01-05-18 Unable to ask abuse questions family with him today.   11-012-19 Unable to ask abuse questions family with him today.   Social Determinants of Health   Financial Resource Strain:   . Difficulty of Paying Living Expenses: Not on file  Food Insecurity:   . Worried About Charity fundraiser in the Last Year: Not on file  . Ran Out of Food in the Last Year: Not on file  Transportation Needs:   . Lack of Transportation (Medical): Not on file  . Lack of Transportation (Non-Medical): Not on file  Physical Activity:   . Days of Exercise per Week: Not on file  . Minutes of Exercise per Session: Not on file  Stress:   . Feeling of Stress : Not on file  Social Connections:   . Frequency of Communication with Friends and Family: Not on  file  . Frequency of Social Gatherings with Friends and Family: Not on file  . Attends Religious Services: Not on file  . Active Member of Clubs or Organizations: Not on file  . Attends Archivist Meetings: Not on file  . Marital Status: Not on file   Past Surgical History:  Procedure Laterality Date  . ARM SKIN LESION BIOPSY / EXCISION Left 09/02/2015  . CATARACT EXTRACTION W/ INTRAOCULAR LENS  IMPLANT, BILATERAL Bilateral   . CHOLECYSTECTOMY OPEN    . CORNEAL TRANSPLANT Bilateral    "one at [facility]; one at [facility]"  . CORONARY ANGIOPLASTY WITH STENT PLACEMENT    . CORONARY ARTERY BYPASS GRAFT  1999  . DESCENDING  AORTIC ANEURYSM REPAIR W/ STENT     DE stent ostium into the right radial free graft at OM-, 02-2007  . ESOPHAGOGASTRODUODENOSCOPY N/A 11/09/2014   Procedure: ESOPHAGOGASTRODUODENOSCOPY (EGD);  Surgeon: [name], MD;  [facility] ENDOSCOPY;  Service: Endoscopy;  Laterality: N/A;  . INGUINAL HERNIA REPAIR    . JOINT REPLACEMENT    . LEFT HEART CATH AND CORS/GRAFTS ANGIOGRAPHY N/A 06/27/2017   Procedure: LEFT HEART CATH AND CORS/GRAFTS ANGIOGRAPHY;  Surgeon: [name], MD;  Location: [place] CV LAB;  Service: Cardiovascular;  Laterality: N/A;  . NASAL SINUS SURGERY    . SHOULDER OPEN ROTATOR CUFF REPAIR Right   . TOTAL KNEE ARTHROPLASTY Bilateral    Past Medical History:  Diagnosis Date  . Adenocarcinoma of right lung ([place]) 01/06/2016  . Anginal chest pain at rest [facility])    Chronic non, controlled on Lorazepam  . Anxiety   . Arthritis    "all over"   . BPH (benign prostatic hyperplasia)   . CAD (coronary artery disease)   . Chronic lower back pain   . Complication of anesthesia    "problems making water afterwards"  . Depression   . DJD (degenerative joint disease)   . Esophageal ulcer without bleeding    bid ppi indefinitely  . Family history of adverse reaction to anesthesia    "children w/PONV"  . GERD (gastroesophageal reflux disease)   . Headache    "couple/week maybe" (09/04/2015)  . Hemochromatosis    Possible, elevated iron stores, hook-like osteophytes on hand films, normal LFTs  . Hepatitis    "yellow jaundice as a baby"  . History of ASCVD    MULTIVESSEL  . Hyperlipidemia   . Hypertension   . Osteoarthritis   . Pneumonia    "several times; got a little now" (09/04/2015)  . Rheumatoid arthritis ([place])   . Subdural hematoma (HCC)    small after fall 09/2016-plavix held, neurosurgery consulted.  Asymptomatic.    Marland Kitchen 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[place]) 02/2018   on Lovenox lifelong since he failed PO Eliquis   BP 99/64   Pulse 80   Temp 97.8 F (36.6 C)   Ht 5\' 4"  (1.626  m)   Wt 175 lb (79.4 kg)   SpO2 96%   BMI 30.04 kg/m   Opioid Risk Score:   Fall Risk Score:  `1  Depression screen PHQ 2/9  Depression screen [facility] 2/9 08/20/2018 10/26/2017 08/23/2017 05/04/2017 04/21/2017  Decreased Interest 0 0 0 0 0  Down, Depressed, Hopeless 2 0 0 0 0  PHQ - 2 Score 2 0 0 0 0  Altered sleeping 1 - - - -  Tired, decreased energy 2 - - - -  Change in appetite 0 - - - -  Feeling bad or failure about yourself  2 - - - -  Trouble concentrating 0 - - - -  Moving slowly or fidgety/restless 0 - - - -  Suicidal thoughts 1 - - - -  PHQ-9 Score 8 - - - -  Difficult doing work/chores Somewhat difficult - - - -  Some recent data might be hidden    Review of Systems  Constitutional: Positive for chills and fever.  HENT: Negative.   Eyes: Negative.   Respiratory: Positive for cough and shortness of breath.   Cardiovascular: Negative.   Endocrine: Negative.   Genitourinary: Positive for difficulty urinating, dysuria and frequency.  Musculoskeletal: Positive for arthralgias, gait problem and myalgias.  Skin: Positive for rash.  Allergic/Immunologic: Negative.   Neurological: Positive for tremors, weakness and numbness.  Hematological: Negative.   Psychiatric/Behavioral: Positive for dysphoric mood. The patient is nervous/anxious.       Objective:   Physical Exam  Constitutional: No distress . Vital signs reviewed. HENT: Normocephalic.  Atraumatic. Eyes: EOMI. No discharge. Cardiovascular: No JVD. Respiratory: Normal effort.  No stridor. GI: Non-distended. Skin: Warm and dry.  Intact. Psych: Confused Musc: No edema in extremities.  No tenderness in extremities. 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  1. 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   Cont therapies  Cont follow up with Neuro  2. Gait abnormality  Cont wheelchair for safety   Consider AFO with therapies  Will consider Botox injection if tone factor in foot inversion as described by therapies, no significant tone at present  3. Urinary frequency  Management per PCP  Consider Urology referral if necessary  Main complaint per caregiver  4. Right shoulder pain  Improved after steroid injection  Cont Voltaren gel for shoulder prn

## 2019-03-22 DIAGNOSIS — I5030 Unspecified diastolic (congestive) heart failure: Secondary | ICD-10-CM | POA: Diagnosis not present

## 2019-03-22 DIAGNOSIS — I69218 Other symptoms and signs involving cognitive functions following other nontraumatic intracranial hemorrhage: Secondary | ICD-10-CM | POA: Diagnosis not present

## 2019-03-22 DIAGNOSIS — I69293 Ataxia following other nontraumatic intracranial hemorrhage: Secondary | ICD-10-CM | POA: Diagnosis not present

## 2019-03-22 DIAGNOSIS — I25118 Atherosclerotic heart disease of native coronary artery with other forms of angina pectoris: Secondary | ICD-10-CM | POA: Diagnosis not present

## 2019-03-22 DIAGNOSIS — R2689 Other abnormalities of gait and mobility: Secondary | ICD-10-CM | POA: Diagnosis not present

## 2019-03-22 DIAGNOSIS — R531 Weakness: Secondary | ICD-10-CM | POA: Diagnosis not present

## 2019-03-22 DIAGNOSIS — I69298 Other sequelae of other nontraumatic intracranial hemorrhage: Secondary | ICD-10-CM | POA: Diagnosis not present

## 2019-03-22 DIAGNOSIS — I252 Old myocardial infarction: Secondary | ICD-10-CM | POA: Diagnosis not present

## 2019-03-22 DIAGNOSIS — I13 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 failure and stage 1 through stage 4 chronic kidney disease, or unspecified chronic kidney disease: Secondary | ICD-10-CM | POA: Diagnosis not present

## 2019-03-25 ENCOUNTER — Other Ambulatory Visit: Payer: Self-pay | Admitting: Family Medicine

## 2019-03-25 DIAGNOSIS — N403 Nodular prostate with lower urinary tract symptoms: Secondary | ICD-10-CM | POA: Diagnosis not present

## 2019-03-25 DIAGNOSIS — R3915 Urgency of urination: Secondary | ICD-10-CM | POA: Diagnosis not present

## 2019-03-26 DIAGNOSIS — Z85118 Personal history of other malignant neoplasm of bronchus and lung: Secondary | ICD-10-CM | POA: Diagnosis not present

## 2019-03-26 DIAGNOSIS — J449 Chronic obstructive pulmonary disease, unspecified: Secondary | ICD-10-CM | POA: Diagnosis not present

## 2019-03-27 DIAGNOSIS — I69298 Other sequelae of other nontraumatic intracranial hemorrhage: Secondary | ICD-10-CM | POA: Diagnosis not present

## 2019-03-27 DIAGNOSIS — I5030 Unspecified diastolic (congestive) heart failure: Secondary | ICD-10-CM | POA: Diagnosis not present

## 2019-03-27 DIAGNOSIS — I25118 Atherosclerotic heart disease of native coronary artery with other forms of angina pectoris: Secondary | ICD-10-CM | POA: Diagnosis not present

## 2019-03-27 DIAGNOSIS — R2689 Other abnormalities of gait and mobility: Secondary | ICD-10-CM | POA: Diagnosis not present

## 2019-03-27 DIAGNOSIS — I69218 Other symptoms and signs involving cognitive functions following other nontraumatic intracranial hemorrhage: Secondary | ICD-10-CM | POA: Diagnosis not present

## 2019-03-27 DIAGNOSIS — I69293 Ataxia following other nontraumatic intracranial hemorrhage: Secondary | ICD-10-CM | POA: Diagnosis not present

## 2019-03-27 DIAGNOSIS — I13 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 failure and stage 1 through stage 4 chronic kidney disease, or unspecified chronic kidney disease: Secondary | ICD-10-CM | POA: Diagnosis not present

## 2019-03-27 DIAGNOSIS — I252 Old myocardial infarction: Secondary | ICD-10-CM | POA: Diagnosis not present

## 2019-03-27 DIAGNOSIS — R531 Weakness: Secondary | ICD-10-CM | POA: Diagnosis not present

## 2019-03-28 DIAGNOSIS — R531 Weakness: Secondary | ICD-10-CM | POA: Diagnosis not present

## 2019-03-28 DIAGNOSIS — I69298 Other sequelae of other nontraumatic intracranial hemorrhage: Secondary | ICD-10-CM | POA: Diagnosis not present

## 2019-03-28 DIAGNOSIS — I13 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 failure and stage 1 through stage 4 chronic kidney disease, or unspecified chronic kidney disease: Secondary | ICD-10-CM | POA: Diagnosis not present

## 2019-03-28 DIAGNOSIS — I69293 Ataxia following other nontraumatic intracranial hemorrhage: Secondary | ICD-10-CM | POA: Diagnosis not present

## 2019-03-28 DIAGNOSIS — R2689 Other abnormalities of gait and mobility: Secondary | ICD-10-CM | POA: Diagnosis not present

## 2019-03-28 DIAGNOSIS — I25118 Atherosclerotic heart disease of native coronary artery with other forms of angina pectoris: Secondary | ICD-10-CM | POA: Diagnosis not present

## 2019-03-28 DIAGNOSIS — I69218 Other symptoms and signs involving cognitive functions following other nontraumatic intracranial hemorrhage: Secondary | ICD-10-CM | POA: Diagnosis not present

## 2019-03-28 DIAGNOSIS — I252 Old myocardial infarction: Secondary | ICD-10-CM | POA: Diagnosis not present

## 2019-03-28 DIAGNOSIS — I5030 Unspecified diastolic (congestive) heart failure: Secondary | ICD-10-CM | POA: Diagnosis not present

## 2019-04-01 ENCOUNTER — Other Ambulatory Visit: Payer: Self-pay | Admitting: Interventional Cardiology

## 2019-04-01 DIAGNOSIS — I252 Old myocardial infarction: Secondary | ICD-10-CM | POA: Diagnosis not present

## 2019-04-01 DIAGNOSIS — R531 Weakness: Secondary | ICD-10-CM | POA: Diagnosis not present

## 2019-04-01 DIAGNOSIS — I69298 Other sequelae of other nontraumatic intracranial hemorrhage: Secondary | ICD-10-CM | POA: Diagnosis not present

## 2019-04-01 DIAGNOSIS — I25118 Atherosclerotic heart disease of native coronary artery with other forms of angina pectoris: Secondary | ICD-10-CM | POA: Diagnosis not present

## 2019-04-01 DIAGNOSIS — R2689 Other abnormalities of gait and mobility: Secondary | ICD-10-CM | POA: Diagnosis not present

## 2019-04-01 DIAGNOSIS — I5030 Unspecified diastolic (congestive) heart failure: Secondary | ICD-10-CM | POA: Diagnosis not present

## 2019-04-01 DIAGNOSIS — I69293 Ataxia following other nontraumatic intracranial hemorrhage: Secondary | ICD-10-CM | POA: Diagnosis not present

## 2019-04-01 DIAGNOSIS — I69218 Other symptoms and signs involving cognitive functions following other nontraumatic intracranial hemorrhage: Secondary | ICD-10-CM | POA: Diagnosis not present

## 2019-04-01 DIAGNOSIS — I13 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 failure and stage 1 through stage 4 chronic kidney disease, or unspecified chronic kidney disease: Secondary | ICD-10-CM | POA: Diagnosis not present

## 2019-04-02 DIAGNOSIS — I13 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 failure and stage 1 through stage 4 chronic kidney disease, or unspecified chronic kidney disease: Secondary | ICD-10-CM | POA: Diagnosis not present

## 2019-04-02 DIAGNOSIS — I252 Old myocardial infarction: Secondary | ICD-10-CM | POA: Diagnosis not present

## 2019-04-02 DIAGNOSIS — R2689 Other abnormalities of gait and mobility: Secondary | ICD-10-CM | POA: Diagnosis not present

## 2019-04-02 DIAGNOSIS — I69293 Ataxia following other nontraumatic intracranial hemorrhage: Secondary | ICD-10-CM | POA: Diagnosis not present

## 2019-04-02 DIAGNOSIS — I25118 Atherosclerotic heart disease of native coronary artery with other forms of angina pectoris: Secondary | ICD-10-CM | POA: Diagnosis not present

## 2019-04-02 DIAGNOSIS — I69218 Other symptoms and signs involving cognitive functions following other nontraumatic intracranial hemorrhage: Secondary | ICD-10-CM | POA: Diagnosis not present

## 2019-04-02 DIAGNOSIS — I69298 Other sequelae of other nontraumatic intracranial hemorrhage: Secondary | ICD-10-CM | POA: Diagnosis not present

## 2019-04-02 DIAGNOSIS — I5030 Unspecified diastolic (congestive) heart failure: Secondary | ICD-10-CM | POA: Diagnosis not present

## 2019-04-02 DIAGNOSIS — R531 Weakness: Secondary | ICD-10-CM | POA: Diagnosis not present

## 2019-04-03 DIAGNOSIS — L89519 Pressure ulcer of right ankle, unspecified stage: Secondary | ICD-10-CM | POA: Diagnosis not present

## 2019-04-03 DIAGNOSIS — L8940 Pressure ulcer of contiguous site of back, buttock and hip, unspecified stage: Secondary | ICD-10-CM | POA: Diagnosis not present

## 2019-04-03 DIAGNOSIS — S0190XA Unspecified open wound of unspecified part of head, initial encounter: Secondary | ICD-10-CM | POA: Diagnosis not present

## 2019-04-04 DIAGNOSIS — I69298 Other sequelae of other nontraumatic intracranial hemorrhage: Secondary | ICD-10-CM | POA: Diagnosis not present

## 2019-04-04 DIAGNOSIS — R531 Weakness: Secondary | ICD-10-CM | POA: Diagnosis not present

## 2019-04-04 DIAGNOSIS — I69293 Ataxia following other nontraumatic intracranial hemorrhage: Secondary | ICD-10-CM | POA: Diagnosis not present

## 2019-04-04 DIAGNOSIS — I25118 Atherosclerotic heart disease of native coronary artery with other forms of angina pectoris: Secondary | ICD-10-CM | POA: Diagnosis not present

## 2019-04-04 DIAGNOSIS — I5030 Unspecified diastolic (congestive) heart failure: Secondary | ICD-10-CM | POA: Diagnosis not present

## 2019-04-04 DIAGNOSIS — I69218 Other symptoms and signs involving cognitive functions following other nontraumatic intracranial hemorrhage: Secondary | ICD-10-CM | POA: Diagnosis not present

## 2019-04-04 DIAGNOSIS — I252 Old myocardial infarction: Secondary | ICD-10-CM | POA: Diagnosis not present

## 2019-04-04 DIAGNOSIS — R2689 Other abnormalities of gait and mobility: Secondary | ICD-10-CM | POA: Diagnosis not present

## 2019-04-04 DIAGNOSIS — I13 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 failure and stage 1 through stage 4 chronic kidney disease, or unspecified chronic kidney disease: Secondary | ICD-10-CM | POA: Diagnosis not present

## 2019-04-05 DEATH — deceased

## 2019-04-09 ENCOUNTER — Other Ambulatory Visit: Payer: Self-pay | Admitting: Family Medicine

## 2019-04-09 MED ORDER — METOPROLOL TARTRATE 25 MG PO TABS: 12.5000 mg | ORAL_TABLET | Freq: Two times a day (BID) | ORAL | 3 refills | Status: DC

## 2019-04-11 DIAGNOSIS — R2689 Other abnormalities of gait and mobility: Secondary | ICD-10-CM | POA: Diagnosis not present

## 2019-04-11 DIAGNOSIS — I252 Old myocardial infarction: Secondary | ICD-10-CM | POA: Diagnosis not present

## 2019-04-11 DIAGNOSIS — I69298 Other sequelae of other nontraumatic intracranial hemorrhage: Secondary | ICD-10-CM | POA: Diagnosis not present

## 2019-04-11 DIAGNOSIS — I69293 Ataxia following other nontraumatic intracranial hemorrhage: Secondary | ICD-10-CM | POA: Diagnosis not present

## 2019-04-11 DIAGNOSIS — I13 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 failure and stage 1 through stage 4 chronic kidney disease, or unspecified chronic kidney disease: Secondary | ICD-10-CM | POA: Diagnosis not present

## 2019-04-11 DIAGNOSIS — I5030 Unspecified diastolic (congestive) heart failure: Secondary | ICD-10-CM | POA: Diagnosis not present

## 2019-04-11 DIAGNOSIS — R531 Weakness: Secondary | ICD-10-CM | POA: Diagnosis not present

## 2019-04-11 DIAGNOSIS — I25118 Atherosclerotic heart disease of native coronary artery with other forms of angina pectoris: Secondary | ICD-10-CM | POA: Diagnosis not present

## 2019-04-11 DIAGNOSIS — I69218 Other symptoms and signs involving cognitive functions following other nontraumatic intracranial hemorrhage: Secondary | ICD-10-CM | POA: Diagnosis not present

## 2019-04-12 DIAGNOSIS — R531 Weakness: Secondary | ICD-10-CM | POA: Diagnosis not present

## 2019-04-12 DIAGNOSIS — I13 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 failure and stage 1 through stage 4 chronic kidney disease, or unspecified chronic kidney disease: Secondary | ICD-10-CM | POA: Diagnosis not present

## 2019-04-12 DIAGNOSIS — I69293 Ataxia following other nontraumatic intracranial hemorrhage: Secondary | ICD-10-CM | POA: Diagnosis not present

## 2019-04-12 DIAGNOSIS — I69298 Other sequelae of other nontraumatic intracranial hemorrhage: Secondary | ICD-10-CM | POA: Diagnosis not present

## 2019-04-12 DIAGNOSIS — I69218 Other symptoms and signs involving cognitive functions following other nontraumatic intracranial hemorrhage: Secondary | ICD-10-CM | POA: Diagnosis not present

## 2019-04-12 DIAGNOSIS — I25118 Atherosclerotic heart disease of native coronary artery with other forms of angina pectoris: Secondary | ICD-10-CM | POA: Diagnosis not present

## 2019-04-12 DIAGNOSIS — I252 Old myocardial infarction: Secondary | ICD-10-CM | POA: Diagnosis not present

## 2019-04-12 DIAGNOSIS — R2689 Other abnormalities of gait and mobility: Secondary | ICD-10-CM | POA: Diagnosis not present

## 2019-04-12 DIAGNOSIS — I5030 Unspecified diastolic (congestive) heart failure: Secondary | ICD-10-CM | POA: Diagnosis not present

## 2019-04-16 ENCOUNTER — Other Ambulatory Visit: Payer: Self-pay

## 2019-04-16 ENCOUNTER — Encounter: Payer: Self-pay | Admitting: Physical Medicine & Rehabilitation

## 2019-04-16 ENCOUNTER — Encounter: Payer: Medicare HMO | Attending: Physical Medicine & Rehabilitation | Admitting: Physical Medicine & Rehabilitation

## 2019-04-16 VITALS — BP 114/78 | HR 76 | Temp 96.8°F | Ht 64.0 in | Wt 170.0 lb

## 2019-04-16 DIAGNOSIS — I610 Nontraumatic intracerebral hemorrhage in hemisphere, subcortical: Secondary | ICD-10-CM

## 2019-04-16 DIAGNOSIS — R600 Localized edema: Secondary | ICD-10-CM | POA: Insufficient documentation

## 2019-04-16 DIAGNOSIS — Z9049 Acquired absence of other specified parts of digestive tract: Secondary | ICD-10-CM | POA: Insufficient documentation

## 2019-04-16 DIAGNOSIS — F419 Anxiety disorder, unspecified: Secondary | ICD-10-CM | POA: Insufficient documentation

## 2019-04-16 DIAGNOSIS — L89609 Pressure ulcer of unspecified heel, unspecified stage: Secondary | ICD-10-CM | POA: Insufficient documentation

## 2019-04-16 DIAGNOSIS — F329 Major depressive disorder, single episode, unspecified: Secondary | ICD-10-CM | POA: Insufficient documentation

## 2019-04-16 DIAGNOSIS — R269 Unspecified abnormalities of gait and mobility: Secondary | ICD-10-CM | POA: Diagnosis not present

## 2019-04-16 DIAGNOSIS — R350 Frequency of micturition: Secondary | ICD-10-CM

## 2019-04-16 DIAGNOSIS — M25511 Pain in right shoulder: Secondary | ICD-10-CM

## 2019-04-16 DIAGNOSIS — Z8261 Family history of arthritis: Secondary | ICD-10-CM | POA: Insufficient documentation

## 2019-04-16 DIAGNOSIS — R270 Ataxia, unspecified: Secondary | ICD-10-CM | POA: Diagnosis not present

## 2019-04-16 DIAGNOSIS — H9193 Unspecified hearing loss, bilateral: Secondary | ICD-10-CM | POA: Diagnosis not present

## 2019-04-16 DIAGNOSIS — Z951 Presence of aortocoronary bypass graft: Secondary | ICD-10-CM | POA: Insufficient documentation

## 2019-04-16 DIAGNOSIS — Z8249 Family history of ischemic heart disease and other diseases of the circulatory system: Secondary | ICD-10-CM | POA: Insufficient documentation

## 2019-04-16 DIAGNOSIS — I2510 Atherosclerotic heart disease of native coronary artery without angina pectoris: Secondary | ICD-10-CM | POA: Insufficient documentation

## 2019-04-16 DIAGNOSIS — I25709 Atherosclerosis of coronary artery bypass graft(s), unspecified, with unspecified angina pectoris: Secondary | ICD-10-CM | POA: Insufficient documentation

## 2019-04-16 DIAGNOSIS — G8929 Other chronic pain: Secondary | ICD-10-CM | POA: Diagnosis not present

## 2019-04-16 DIAGNOSIS — Z85118 Personal history of other malignant neoplasm of bronchus and lung: Secondary | ICD-10-CM | POA: Insufficient documentation

## 2019-04-16 DIAGNOSIS — I69298 Other sequelae of other nontraumatic intracranial hemorrhage: Secondary | ICD-10-CM | POA: Diagnosis not present

## 2019-04-16 DIAGNOSIS — M545 Low back pain: Secondary | ICD-10-CM | POA: Insufficient documentation

## 2019-04-16 DIAGNOSIS — I10 Essential (primary) hypertension: Secondary | ICD-10-CM | POA: Insufficient documentation

## 2019-04-16 DIAGNOSIS — J700 Acute pulmonary manifestations due to radiation: Secondary | ICD-10-CM | POA: Insufficient documentation

## 2019-04-16 DIAGNOSIS — I619 Nontraumatic intracerebral hemorrhage, unspecified: Secondary | ICD-10-CM | POA: Diagnosis not present

## 2019-04-16 DIAGNOSIS — R2689 Other abnormalities of gait and mobility: Secondary | ICD-10-CM | POA: Diagnosis not present

## 2019-04-16 DIAGNOSIS — M1990 Unspecified osteoarthritis, unspecified site: Secondary | ICD-10-CM | POA: Insufficient documentation

## 2019-04-16 DIAGNOSIS — Z955 Presence of coronary angioplasty implant and graft: Secondary | ICD-10-CM | POA: Insufficient documentation

## 2019-04-16 DIAGNOSIS — Z96653 Presence of artificial knee joint, bilateral: Secondary | ICD-10-CM | POA: Insufficient documentation

## 2019-04-16 DIAGNOSIS — D689 Coagulation defect, unspecified: Secondary | ICD-10-CM | POA: Diagnosis not present

## 2019-04-16 DIAGNOSIS — R1310 Dysphagia, unspecified: Secondary | ICD-10-CM | POA: Insufficient documentation

## 2019-04-16 DIAGNOSIS — I252 Old myocardial infarction: Secondary | ICD-10-CM | POA: Diagnosis not present

## 2019-04-16 DIAGNOSIS — I69218 Other symptoms and signs involving cognitive functions following other nontraumatic intracranial hemorrhage: Secondary | ICD-10-CM | POA: Diagnosis not present

## 2019-04-16 DIAGNOSIS — M79662 Pain in left lower leg: Secondary | ICD-10-CM | POA: Insufficient documentation

## 2019-04-16 DIAGNOSIS — Z947 Corneal transplant status: Secondary | ICD-10-CM | POA: Insufficient documentation

## 2019-04-16 DIAGNOSIS — I5030 Unspecified diastolic (congestive) heart failure: Secondary | ICD-10-CM | POA: Diagnosis not present

## 2019-04-16 DIAGNOSIS — R4189 Other symptoms and signs involving cognitive functions and awareness: Secondary | ICD-10-CM | POA: Insufficient documentation

## 2019-04-16 DIAGNOSIS — M069 Rheumatoid arthritis, unspecified: Secondary | ICD-10-CM | POA: Insufficient documentation

## 2019-04-16 DIAGNOSIS — I25118 Atherosclerotic heart disease of native coronary artery with other forms of angina pectoris: Secondary | ICD-10-CM | POA: Diagnosis not present

## 2019-04-16 DIAGNOSIS — R531 Weakness: Secondary | ICD-10-CM | POA: Diagnosis not present

## 2019-04-16 DIAGNOSIS — I13 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 failure and stage 1 through stage 4 chronic kidney disease, or unspecified chronic kidney disease: Secondary | ICD-10-CM | POA: Diagnosis not present

## 2019-04-16 NOTE — Progress Notes (Signed)
Subjective:    Patient ID: Evan [name], male    DOB: Jan 22, 1928, 86 y.o.   MRN: [id number]  TELEHEALTH NOTE  Due to national recommendations of social distancing due to COVID 19, an audio/video telehealth visit is felt to be most appropriate for this patient at this time.  See Chart message from today for the patient's consent to telehealth from [place].     I verified that I am speaking with the correct person using two identifiers.  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  Last clinic visit on 03/21/2019.  Since that time, patient saw positive care, patient DNR-notes reviewed.  History provided by daughter.  She states, he is still in therapies. He is following up with Neurology.  Denies falls. Per therapies, per daughter, "when he walks his foot lands on the lateral border".  Urinary freq appears to be improving. Right shoulder has improved.  Pain Inventory Average Pain 8 Pain Right Now 8 My pain is dull and aching  In the last 24 hours, has pain interfered with the following? General activity 9 Relation with others 0 Enjoyment of life 9 What TIME of day is your pain at its worst? daytime Sleep (in general) Good  Pain is worse with: walking, bending, standing and some activites Pain improves with: rest and medication Relief from Meds: 9  Mobility use a wheelchair needs help with transfers  Function disabled: date disabled na I need assistance with the following:   dressing, bathing, toileting, meal prep, household duties and shopping  Neuro/Psych weakness numbness depression anxiety  Prior Studies Any changes since last visit?  no  Physicians involved in your care Any changes since last visit?  no Primary care Dr. Irish Lack Pulmonary-Dr. McQuaid   Family History  Problem Relation Age of Onset  . Arthritis-Osteo Sister   . Heart attack Brother   . Heart attack Other   . Cancer Neg Hx    Social History   Socioeconomic History  . Marital status: Widowed    Spouse name: Not on file  . Number of children: Not on file  . Years of education: Not on file  . Highest education level: Not on file  Occupational History  . Not on file  Tobacco Use  . Smoking status: Never Smoker  . Smokeless tobacco: Former Systems developer    Types: Chew  . Tobacco comment: "quit chewing in the 1960s"  Substance and Sexual Activity  . Alcohol use: No  . Drug use: No  . Sexual activity: Never  Other Topics Concern  . Not on file  Social History Narrative   01-05-18 Unable to ask abuse questions family with him today.   11-012-19 Unable to ask abuse questions family with him today.   Social Determinants of Health   Financial Resource Strain:   . Difficulty of Paying Living Expenses: Not on file  Food Insecurity:   . Worried About Charity fundraiser in the Last Year: Not on file  . Ran Out of Food in the Last Year: Not on file  Transportation Needs:   . Lack of  Transportation (Medical): Not on file  . Lack of Transportation (Non-Medical): Not on file  Physical Activity:   . Days of Exercise per Week: Not on file  . Minutes of Exercise per Session: Not on file  Stress:   . Feeling of Stress : Not on file  Social Connections:   . Frequency of Communication with Friends and Family: Not on file  . Frequency of Social Gatherings with Friends and Family: Not on file  . Attends Religious Services: Not on file  . Active Member of Clubs or Organizations: Not on  file  . Attends Archivist Meetings: Not on file  . Marital Status: Not on file   Past Surgical History:  Procedure Laterality Date  . ARM SKIN LESION BIOPSY / EXCISION Left 09/02/2015  . CATARACT EXTRACTION W/ INTRAOCULAR LENS  IMPLANT, BILATERAL Bilateral   . CHOLECYSTECTOMY OPEN    . CORNEAL TRANSPLANT Bilateral    "one at [facility]; one at [facility]"  . CORONARY ANGIOPLASTY WITH STENT PLACEMENT    . CORONARY ARTERY BYPASS GRAFT  1999  . DESCENDING AORTIC ANEURYSM REPAIR W/ STENT     DE stent ostium into the right radial free graft at OM-, 02-2007  . ESOPHAGOGASTRODUODENOSCOPY N/A 11/09/2014   Procedure: ESOPHAGOGASTRODUODENOSCOPY (EGD);  Surgeon: [name], MD;  Location: [facility] ENDOSCOPY;  Service: Endoscopy;  Laterality: N/A;  . INGUINAL HERNIA REPAIR    . JOINT REPLACEMENT    . LEFT HEART CATH AND CORS/GRAFTS ANGIOGRAPHY N/A 06/27/2017   Procedure: LEFT HEART CATH AND CORS/GRAFTS ANGIOGRAPHY;  Surgeon: [name], MD;  Location: [place] [facility] LAB;  Service: Cardiovascular;  Laterality: N/A;  . NASAL SINUS SURGERY    . SHOULDER OPEN ROTATOR CUFF REPAIR Right   . TOTAL KNEE ARTHROPLASTY Bilateral    Past Medical History:  Diagnosis Date  . Adenocarcinoma of right lung ([place]) 01/06/2016  . Anginal chest pain at rest [name])    Chronic non, controlled on Lorazepam  . Anxiety   . Arthritis    "all over"   . BPH (benign prostatic hyperplasia)   . CAD (coronary artery disease)   . Chronic lower back pain   . Complication of anesthesia    "problems making water afterwards"  . Depression   . DJD (degenerative joint disease)   . Esophageal ulcer without bleeding    bid ppi indefinitely  . Family history of adverse reaction to anesthesia    "children w/PONV"  . GERD (gastroesophageal reflux disease)   . Headache    "couple/week maybe" (09/04/2015)  . Hemochromatosis    Possible, elevated iron stores, hook-like osteophytes on hand films, normal LFTs  . Hepatitis    "yellow  jaundice as a baby"  . History of ASCVD    MULTIVESSEL  . Hyperlipidemia   . Hypertension   . Osteoarthritis   . Pneumonia    "several times; got a little now" (09/04/2015)  . Rheumatoid arthritis ([place])   . Subdural hematoma (HCC)    small after fall 09/2016-plavix held, neurosurgery consulted.  Asymptomatic.    Marland Kitchen 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[place]) 02/2018   on Lovenox lifelong since he failed PO Eliquis   BP 114/78   Pulse 76   Temp (!) 96.8 F (36 C)   Ht 5\' 4"  (1.626 m)   Wt 170 lb (77.1 kg)   SpO2 96%   BMI 29.18 kg/m   Opioid Risk Score:   Fall Risk Score:  `1  Depression screen PHQ 2/9  Depression screen PHQ  2/9 08/20/2018 10/26/2017 08/23/2017 05/04/2017 04/21/2017  Decreased Interest 0 0 0 0 0  Down, Depressed, Hopeless 2 0 0 0 0  PHQ - 2 Score 2 0 0 0 0  Altered sleeping 1 - - - -  Tired, decreased energy 2 - - - -  Change in appetite 0 - - - -  Feeling bad or failure about yourself  2 - - - -  Trouble concentrating 0 - - - -  Moving slowly or fidgety/restless 0 - - - -  Suicidal thoughts 1 - - - -  PHQ-9 Score 8 - - - -  Difficult doing work/chores Somewhat difficult - - - -  Some recent data might be hidden    Review of Systems  Constitutional: Negative.   HENT: Negative.   Eyes: Negative.   Respiratory: Negative.   Cardiovascular: Negative.   Endocrine: Negative.   Genitourinary: Positive for difficulty urinating, dysuria and frequency.  Musculoskeletal: Positive for arthralgias, gait problem and myalgias.  Skin: Positive for rash.  Allergic/Immunologic: Negative.   Neurological: Positive for tremors, weakness and numbness.  Hematological: Negative.   Psychiatric/Behavioral: Positive for dysphoric mood. The patient is nervous/anxious.       Objective:   Physical Exam  Constitutional: No distress .  Respiratory: Normal effort.   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  1. 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   Continue therapies  Continue follow up with Neuro  2. Gait abnormality  Continue wheelchair for safety   Consider AFO with therapies  Cont therapies  Based on description, appears pt will benefit from botulinum toxin injection, but unable to assess due to televist.  Discussed possibility of eval, however, given CoVid risk score, after discussion with family, pt would like to delay coming into the office, which is reason.   Discussed use of kickstand on PRAFO  3. Urinary frequency  Management per PCP  Consider Urology referral if necessary  Main complaint per caregiver  Improving  4. Right shoulder pain  Improved after steroid injection  Cont Voltaren gel for shoulder prn  Improving

## 2019-04-17 DIAGNOSIS — D689 Coagulation defect, unspecified: Secondary | ICD-10-CM | POA: Diagnosis not present

## 2019-04-17 DIAGNOSIS — I610 Nontraumatic intracerebral hemorrhage in hemisphere, subcortical: Secondary | ICD-10-CM | POA: Diagnosis not present

## 2019-04-17 DIAGNOSIS — I619 Nontraumatic intracerebral hemorrhage, unspecified: Secondary | ICD-10-CM | POA: Diagnosis not present

## 2019-04-18 DIAGNOSIS — I13 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 failure and stage 1 through stage 4 chronic kidney disease, or unspecified chronic kidney disease: Secondary | ICD-10-CM | POA: Diagnosis not present

## 2019-04-18 DIAGNOSIS — R531 Weakness: Secondary | ICD-10-CM | POA: Diagnosis not present

## 2019-04-18 DIAGNOSIS — I69218 Other symptoms and signs involving cognitive functions following other nontraumatic intracranial hemorrhage: Secondary | ICD-10-CM | POA: Diagnosis not present

## 2019-04-18 DIAGNOSIS — I25118 Atherosclerotic heart disease of native coronary artery with other forms of angina pectoris: Secondary | ICD-10-CM | POA: Diagnosis not present

## 2019-04-18 DIAGNOSIS — R2689 Other abnormalities of gait and mobility: Secondary | ICD-10-CM | POA: Diagnosis not present

## 2019-04-18 DIAGNOSIS — I69298 Other sequelae of other nontraumatic intracranial hemorrhage: Secondary | ICD-10-CM | POA: Diagnosis not present

## 2019-04-18 DIAGNOSIS — I5030 Unspecified diastolic (congestive) heart failure: Secondary | ICD-10-CM | POA: Diagnosis not present

## 2019-04-18 DIAGNOSIS — I252 Old myocardial infarction: Secondary | ICD-10-CM | POA: Diagnosis not present

## 2019-04-18 DIAGNOSIS — R270 Ataxia, unspecified: Secondary | ICD-10-CM | POA: Diagnosis not present

## 2019-04-22 DIAGNOSIS — R270 Ataxia, unspecified: Secondary | ICD-10-CM | POA: Diagnosis not present

## 2019-04-22 DIAGNOSIS — I25118 Atherosclerotic heart disease of native coronary artery with other forms of angina pectoris: Secondary | ICD-10-CM | POA: Diagnosis not present

## 2019-04-22 DIAGNOSIS — I5030 Unspecified diastolic (congestive) heart failure: Secondary | ICD-10-CM | POA: Diagnosis not present

## 2019-04-22 DIAGNOSIS — I13 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 failure and stage 1 through stage 4 chronic kidney disease, or unspecified chronic kidney disease: Secondary | ICD-10-CM | POA: Diagnosis not present

## 2019-04-22 DIAGNOSIS — R531 Weakness: Secondary | ICD-10-CM | POA: Diagnosis not present

## 2019-04-22 DIAGNOSIS — I69298 Other sequelae of other nontraumatic intracranial hemorrhage: Secondary | ICD-10-CM | POA: Diagnosis not present

## 2019-04-22 DIAGNOSIS — I69218 Other symptoms and signs involving cognitive functions following other nontraumatic intracranial hemorrhage: Secondary | ICD-10-CM | POA: Diagnosis not present

## 2019-04-22 DIAGNOSIS — R2689 Other abnormalities of gait and mobility: Secondary | ICD-10-CM | POA: Diagnosis not present

## 2019-04-22 DIAGNOSIS — I252 Old myocardial infarction: Secondary | ICD-10-CM | POA: Diagnosis not present

## 2019-04-23 ENCOUNTER — Ambulatory Visit: Payer: Medicare HMO | Admitting: Adult Health

## 2019-04-25 DIAGNOSIS — I69218 Other symptoms and signs involving cognitive functions following other nontraumatic intracranial hemorrhage: Secondary | ICD-10-CM | POA: Diagnosis not present

## 2019-04-25 DIAGNOSIS — I25118 Atherosclerotic heart disease of native coronary artery with other forms of angina pectoris: Secondary | ICD-10-CM | POA: Diagnosis not present

## 2019-04-25 DIAGNOSIS — R270 Ataxia, unspecified: Secondary | ICD-10-CM | POA: Diagnosis not present

## 2019-04-25 DIAGNOSIS — I69298 Other sequelae of other nontraumatic intracranial hemorrhage: Secondary | ICD-10-CM | POA: Diagnosis not present

## 2019-04-25 DIAGNOSIS — R531 Weakness: Secondary | ICD-10-CM | POA: Diagnosis not present

## 2019-04-25 DIAGNOSIS — I13 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 failure and stage 1 through stage 4 chronic kidney disease, or unspecified chronic kidney disease: Secondary | ICD-10-CM | POA: Diagnosis not present

## 2019-04-25 DIAGNOSIS — I252 Old myocardial infarction: Secondary | ICD-10-CM | POA: Diagnosis not present

## 2019-04-25 DIAGNOSIS — R2689 Other abnormalities of gait and mobility: Secondary | ICD-10-CM | POA: Diagnosis not present

## 2019-04-25 DIAGNOSIS — I5030 Unspecified diastolic (congestive) heart failure: Secondary | ICD-10-CM | POA: Diagnosis not present

## 2019-04-26 DIAGNOSIS — Z85118 Personal history of other malignant neoplasm of bronchus and lung: Secondary | ICD-10-CM | POA: Diagnosis not present

## 2019-04-26 DIAGNOSIS — J449 Chronic obstructive pulmonary disease, unspecified: Secondary | ICD-10-CM | POA: Diagnosis not present

## 2019-04-29 ENCOUNTER — Telehealth: Payer: Self-pay | Admitting: Family Medicine

## 2019-04-29 ENCOUNTER — Other Ambulatory Visit: Payer: Self-pay | Admitting: Family Medicine

## 2019-04-29 DIAGNOSIS — I252 Old myocardial infarction: Secondary | ICD-10-CM | POA: Diagnosis not present

## 2019-04-29 DIAGNOSIS — R2689 Other abnormalities of gait and mobility: Secondary | ICD-10-CM | POA: Diagnosis not present

## 2019-04-29 DIAGNOSIS — I69218 Other symptoms and signs involving cognitive functions following other nontraumatic intracranial hemorrhage: Secondary | ICD-10-CM | POA: Diagnosis not present

## 2019-04-29 DIAGNOSIS — I13 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 failure and stage 1 through stage 4 chronic kidney disease, or unspecified chronic kidney disease: Secondary | ICD-10-CM | POA: Diagnosis not present

## 2019-04-29 DIAGNOSIS — I25118 Atherosclerotic heart disease of native coronary artery with other forms of angina pectoris: Secondary | ICD-10-CM | POA: Diagnosis not present

## 2019-04-29 DIAGNOSIS — I5030 Unspecified diastolic (congestive) heart failure: Secondary | ICD-10-CM | POA: Diagnosis not present

## 2019-04-29 DIAGNOSIS — R270 Ataxia, unspecified: Secondary | ICD-10-CM | POA: Diagnosis not present

## 2019-04-29 DIAGNOSIS — R531 Weakness: Secondary | ICD-10-CM | POA: Diagnosis not present

## 2019-04-29 DIAGNOSIS — I69298 Other sequelae of other nontraumatic intracranial hemorrhage: Secondary | ICD-10-CM | POA: Diagnosis not present

## 2019-04-29 MED ORDER — SULFAMETHOXAZOLE-TRIMETHOPRIM 800-160 MG PO TABS: 1.0000 | ORAL_TABLET | Freq: Two times a day (BID) | ORAL | 0 refills | Status: DC

## 2019-04-29 NOTE — Telephone Encounter (Signed)
Evan [name] aware and states that it is the same thing that happened last time and she is not sure if it is infection/ulcer coming back or gout. If antibx does not work then she will call us back

## 2019-04-29 NOTE — Telephone Encounter (Signed)
Evan [name] jo calling to say that the same that what was happening with his ankle last time is happening again, would like to know if antibiotic can be called in so he dosent have to come in the office,  [phone]

## 2019-04-29 NOTE — Telephone Encounter (Signed)
Do they think its gout or a skin infection.  I will send in bactrim.

## 2019-05-01 DIAGNOSIS — I5030 Unspecified diastolic (congestive) heart failure: Secondary | ICD-10-CM | POA: Diagnosis not present

## 2019-05-01 DIAGNOSIS — I252 Old myocardial infarction: Secondary | ICD-10-CM | POA: Diagnosis not present

## 2019-05-01 DIAGNOSIS — I13 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 failure and stage 1 through stage 4 chronic kidney disease, or unspecified chronic kidney disease: Secondary | ICD-10-CM | POA: Diagnosis not present

## 2019-05-01 DIAGNOSIS — I69298 Other sequelae of other nontraumatic intracranial hemorrhage: Secondary | ICD-10-CM | POA: Diagnosis not present

## 2019-05-01 DIAGNOSIS — I69218 Other symptoms and signs involving cognitive functions following other nontraumatic intracranial hemorrhage: Secondary | ICD-10-CM | POA: Diagnosis not present

## 2019-05-01 DIAGNOSIS — R2689 Other abnormalities of gait and mobility: Secondary | ICD-10-CM | POA: Diagnosis not present

## 2019-05-01 DIAGNOSIS — R270 Ataxia, unspecified: Secondary | ICD-10-CM | POA: Diagnosis not present

## 2019-05-01 DIAGNOSIS — R531 Weakness: Secondary | ICD-10-CM | POA: Diagnosis not present

## 2019-05-01 DIAGNOSIS — I25118 Atherosclerotic heart disease of native coronary artery with other forms of angina pectoris: Secondary | ICD-10-CM | POA: Diagnosis not present

## 2019-05-02 ENCOUNTER — Other Ambulatory Visit: Payer: Medicare HMO | Admitting: Nurse Practitioner

## 2019-05-02 ENCOUNTER — Telehealth: Payer: Self-pay | Admitting: Nurse Practitioner

## 2019-05-02 ENCOUNTER — Other Ambulatory Visit: Payer: Self-pay

## 2019-05-02 NOTE — Telephone Encounter (Signed)
I called Evan [name], [name] daughter for scheduled telemedicine, telephonic palliative care followup visit, no answer, unable to leave a message. Will attempt to reschedule

## 2019-05-05 ENCOUNTER — Ambulatory Visit: Payer: Medicare HMO

## 2019-05-06 DIAGNOSIS — I13 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 failure and stage 1 through stage 4 chronic kidney disease, or unspecified chronic kidney disease: Secondary | ICD-10-CM | POA: Diagnosis not present

## 2019-05-06 DIAGNOSIS — I252 Old myocardial infarction: Secondary | ICD-10-CM | POA: Diagnosis not present

## 2019-05-06 DIAGNOSIS — R270 Ataxia, unspecified: Secondary | ICD-10-CM | POA: Diagnosis not present

## 2019-05-06 DIAGNOSIS — I69298 Other sequelae of other nontraumatic intracranial hemorrhage: Secondary | ICD-10-CM | POA: Diagnosis not present

## 2019-05-06 DIAGNOSIS — I69218 Other symptoms and signs involving cognitive functions following other nontraumatic intracranial hemorrhage: Secondary | ICD-10-CM | POA: Diagnosis not present

## 2019-05-06 DIAGNOSIS — I5030 Unspecified diastolic (congestive) heart failure: Secondary | ICD-10-CM | POA: Diagnosis not present

## 2019-05-06 DIAGNOSIS — R2689 Other abnormalities of gait and mobility: Secondary | ICD-10-CM | POA: Diagnosis not present

## 2019-05-06 DIAGNOSIS — R531 Weakness: Secondary | ICD-10-CM | POA: Diagnosis not present

## 2019-05-06 DIAGNOSIS — I25118 Atherosclerotic heart disease of native coronary artery with other forms of angina pectoris: Secondary | ICD-10-CM | POA: Diagnosis not present

## 2019-05-08 DIAGNOSIS — R2689 Other abnormalities of gait and mobility: Secondary | ICD-10-CM | POA: Diagnosis not present

## 2019-05-08 DIAGNOSIS — I69298 Other sequelae of other nontraumatic intracranial hemorrhage: Secondary | ICD-10-CM | POA: Diagnosis not present

## 2019-05-08 DIAGNOSIS — R270 Ataxia, unspecified: Secondary | ICD-10-CM | POA: Diagnosis not present

## 2019-05-08 DIAGNOSIS — I25118 Atherosclerotic heart disease of native coronary artery with other forms of angina pectoris: Secondary | ICD-10-CM | POA: Diagnosis not present

## 2019-05-08 DIAGNOSIS — I252 Old myocardial infarction: Secondary | ICD-10-CM | POA: Diagnosis not present

## 2019-05-08 DIAGNOSIS — I13 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 failure and stage 1 through stage 4 chronic kidney disease, or unspecified chronic kidney disease: Secondary | ICD-10-CM | POA: Diagnosis not present

## 2019-05-08 DIAGNOSIS — I69218 Other symptoms and signs involving cognitive functions following other nontraumatic intracranial hemorrhage: Secondary | ICD-10-CM | POA: Diagnosis not present

## 2019-05-08 DIAGNOSIS — R531 Weakness: Secondary | ICD-10-CM | POA: Diagnosis not present

## 2019-05-08 DIAGNOSIS — I5030 Unspecified diastolic (congestive) heart failure: Secondary | ICD-10-CM | POA: Diagnosis not present

## 2019-05-10 ENCOUNTER — Ambulatory Visit: Payer: Medicare HMO | Attending: Internal Medicine

## 2019-05-10 ENCOUNTER — Ambulatory Visit: Payer: Medicare HMO

## 2019-05-10 DIAGNOSIS — Z23 Encounter for immunization: Secondary | ICD-10-CM

## 2019-05-10 NOTE — Progress Notes (Signed)
   Covid-19 Vaccination Clinic  Name:  Evan [name]    MRN: [id number] DOB: 1927/04/07  05/10/2019  Mr. [name] was observed post Covid-19 immunization for 15 minutes without incidence. He was provided with Vaccine Information Sheet and instruction to access the V-Safe system.   Mr. [name] was instructed to call 911 with any severe reactions post vaccine: Marland Kitchen Difficulty breathing  . Swelling of your face and throat  . A fast heartbeat  . A bad rash all over your body  . Dizziness and weakness    Immunizations Administered    Name Date Dose VIS Date Route   Pfizer COVID-19 Vaccine 05/10/2019 11:12 AM 0.3 mL 03/15/2019 Intramuscular   Manufacturer: [place]   Lot: [id number]   [place]: [address]-7

## 2019-05-17 DIAGNOSIS — I610 Nontraumatic intracerebral hemorrhage in hemisphere, subcortical: Secondary | ICD-10-CM | POA: Diagnosis not present

## 2019-05-17 DIAGNOSIS — I619 Nontraumatic intracerebral hemorrhage, unspecified: Secondary | ICD-10-CM | POA: Diagnosis not present

## 2019-05-17 DIAGNOSIS — D689 Coagulation defect, unspecified: Secondary | ICD-10-CM | POA: Diagnosis not present

## 2019-05-18 DIAGNOSIS — I610 Nontraumatic intracerebral hemorrhage in hemisphere, subcortical: Secondary | ICD-10-CM | POA: Diagnosis not present

## 2019-05-18 DIAGNOSIS — I619 Nontraumatic intracerebral hemorrhage, unspecified: Secondary | ICD-10-CM | POA: Diagnosis not present

## 2019-05-18 DIAGNOSIS — D689 Coagulation defect, unspecified: Secondary | ICD-10-CM | POA: Diagnosis not present

## 2019-05-21 ENCOUNTER — Telehealth: Payer: Self-pay | Admitting: Nurse Practitioner

## 2019-05-21 NOTE — Telephone Encounter (Signed)
I attempted to call Mr. Funari to reschedule palliative care f/u visit, no answer, message left with contact information

## 2019-05-27 DIAGNOSIS — J449 Chronic obstructive pulmonary disease, unspecified: Secondary | ICD-10-CM | POA: Diagnosis not present

## 2019-05-27 DIAGNOSIS — Z85118 Personal history of other malignant neoplasm of bronchus and lung: Secondary | ICD-10-CM | POA: Diagnosis not present

## 2019-05-31 ENCOUNTER — Other Ambulatory Visit: Payer: Self-pay | Admitting: Urology

## 2019-05-31 DIAGNOSIS — C3491 Malignant neoplasm of unspecified part of right bronchus or lung: Secondary | ICD-10-CM

## 2019-06-03 ENCOUNTER — Other Ambulatory Visit: Payer: Self-pay

## 2019-06-03 ENCOUNTER — Encounter: Payer: Self-pay | Admitting: Family Medicine

## 2019-06-03 ENCOUNTER — Ambulatory Visit (INDEPENDENT_AMBULATORY_CARE_PROVIDER_SITE_OTHER): Payer: Medicare HMO | Admitting: Family Medicine

## 2019-06-03 VITALS — BP 110/62 | HR 70 | Temp 97.2°F | Resp 18 | Ht 68.0 in

## 2019-06-03 DIAGNOSIS — L03115 Cellulitis of right lower limb: Secondary | ICD-10-CM

## 2019-06-03 DIAGNOSIS — I69351 Hemiplegia and hemiparesis following cerebral infarction affecting right dominant side: Secondary | ICD-10-CM | POA: Diagnosis not present

## 2019-06-03 MED ORDER — HYDROXYZINE HCL 10 MG PO TABS: 10.0000 mg | ORAL_TABLET | Freq: Every day | ORAL | 0 refills | Status: DC

## 2019-06-03 MED ORDER — SULFAMETHOXAZOLE-TRIMETHOPRIM 800-160 MG PO TABS: 1.0000 | ORAL_TABLET | Freq: Two times a day (BID) | ORAL | 1 refills | Status: DC

## 2019-06-03 MED ORDER — DUODERM CGF DRESSING EX MISC: 1.0000 | Freq: Every day | CUTANEOUS | 3 refills | Status: DC

## 2019-06-03 NOTE — Progress Notes (Signed)
Subjective:    Patient ID: Evan [name], male    DOB: Nov 21, 1927, 86 y.o.   MRN: [id number]  HPI Patient is a very pleasant 86 year old Caucasian male who is here today with his daughter who is his primary caregiver.  Patient suffered a hemorrhagic stroke and has right-sided hemiparesis.  He has diminished sensation in his right leg.  He is unable to feel vibration, pain, or cold in his right leg.  He also has ataxia in his right arm and right leg.  However he is having pain in his right ankle now.  Over the lateral malleolus, the joint is erythematous and warm to the touch.  There is a small skin ulcer pressure sore that is approximately 3 mm in diameter.  It does not penetrate the fascia but is limited to breakdown of the epidermis and dermis.  There is no exposed bone.  There is no discharge.  Previously the patient was taking Bactrim and this resolved a few months ago however it has returned.  Daughter is applying bunny boots every night/heel cups to try to prevent pressure sores however he does lay on that side.  He also has ataxia and spasticity in the right leg and so this often scraped against his wheelchair or rubs against other hard objects. Past Medical History:  Diagnosis Date  . Adenocarcinoma of right lung ([place]) 01/06/2016  . Anginal chest pain at rest [facility])    Chronic non, controlled on Lorazepam  . Anxiety   . Arthritis    "all over"   . BPH (benign prostatic hyperplasia)   . CAD (coronary artery disease)   . Chronic lower back pain   . Complication of anesthesia    "problems making water afterwards"  . Depression   . DJD (degenerative joint disease)   . Esophageal ulcer without bleeding    bid ppi indefinitely  . Family history of adverse reaction to anesthesia    "children w/PONV"  . GERD (gastroesophageal reflux disease)   . Headache    "couple/week maybe" (09/04/2015)  . Hemochromatosis    Possible, elevated iron stores, hook-like osteophytes on hand films, normal LFTs   . Hepatitis    "yellow jaundice as a baby"  . History of ASCVD    MULTIVESSEL  . Hyperlipidemia   . Hypertension   . Osteoarthritis   . Pneumonia    "several times; got a little now" (09/04/2015)  . Rheumatoid arthritis ([place])   . Subdural hematoma (H[facility])    small after fall 09/2016-plavix held, neurosurgery consulted.  Asymptomatic.    Marland Kitchen 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[place]) 02/2018   on Lovenox lifelong since he failed PO Eliquis   Past Surgical History:  Procedure Laterality Date  . ARM SKIN LESION BIOPSY / EXCISION Left 09/02/2015  . CATARACT EXTRACTION W/ INTRAOCULAR LENS  IMPLANT, BILATERAL Bilateral   . CHOLECYSTECTOMY OPEN    . CORNEAL TRANSPLANT Bilateral    "one at [facility]; one [facility]"  . CORONARY ANGIOPLASTY WITH STENT PLACEMENT    . CORONARY ARTERY BYPASS GRAFT  1999  . DESCENDING AORTIC ANEURYSM REPAIR W/ STENT     DE stent ostium into the right radial free graft at OM-, 02-2007  . ESOPHAGOGASTRODUODENOSCOPY N/A 11/09/2014   Procedure: ESOPHAGOGASTRODUODENOSCOPY (EGD);  Surgeon: [name], MD;  Location: [facility] ENDOSCOPY;  Service: Endoscopy;  Laterality: N/A;  . INGUINAL HERNIA REPAIR    . JOINT REPLACEMENT    . LEFT HEART CATH AND CORS/GRAFTS ANGIOGRAPHY N/A 06/27/2017   Procedure: LEFT HEART  CATH AND CORS/GRAFTS ANGIOGRAPHY;  Surgeon: [name], MD;  Location: [place] [facility] LAB;  Service: Cardiovascular;  Laterality: N/A;  . NASAL SINUS SURGERY    . SHOULDER OPEN ROTATOR CUFF REPAIR Right   . TOTAL KNEE ARTHROPLASTY Bilateral    Current Outpatient Medications on File Prior to Visit  Medication Sig Dispense Refill  . acetaminophen (TYLENOL) 325 MG tablet Take 1-2 tablets (325-650 mg total) by mouth every 4 (four) hours as needed for mild pain.    Marland Kitchen albuterol (VENTOLIN HFA) 108 (90 Base) MCG/ACT inhaler Inhale 2 puffs into the lungs every 4 (four) hours as needed for up to 14 days for wheezing or shortness of breath. 18 g 0  . ALPRAZolam (XANAX) 0.5 MG tablet Take 0.5 tablets (0.25  mg total) by mouth 3 (three) times daily as needed for anxiety. 30 tablet 0  . camphor-menthol (SARNA) lotion Apply 1 application topically daily as needed for itching.    . diclofenac sodium (VOLTAREN) 1 % GEL Apply 2 g topically 4 (four) times daily. 350 g 1  . FLUoxetine (PROZAC) 20 MG capsule TAKE ONE CAPSULE BY MOUTH DAILY 90 capsule 3  . furosemide (LASIX) 20 MG tablet TAKE 1 TABLET ONCE A DAY. TAKE AN EXTRA TABLET FOR INCREASED SWELLING, WEIGHT GAIN, OR SHORTNESS OF BREATH 180 tablet 3  . gabapentin (NEURONTIN) 100 MG capsule Take 1 capsule (100 mg total) by mouth 3 (three) times daily. 90 capsule 3  . HYDROcodone-acetaminophen (NORCO) 5-325 MG tablet Take 1 tablet by mouth every 6 (six) hours as needed for moderate pain. 30 tablet 0  . lidocaine (LIDODERM) 5 % Place 1 patch onto the skin daily. Remove & Discard patch within 12 hours or as directed by MD 30 patch 0  . Lifitegrast (XIIDRA) 5 % SOLN Place 1-2 drops into both eyes daily.     . magnesium oxide (MAG-OX) 400 (241.3 Mg) MG tablet Take 1 tablet (400 mg total) by mouth 2 (two) times daily. 180 tablet 3  . metoprolol tartrate (LOPRESSOR) 25 MG tablet Take 0.5 tablets (12.5 mg total) by mouth 2 (two) times daily. 90 tablet 3  . neomycin-polymyxin-hydrocortisone (CORTISPORIN) OTIC solution Place 4 drops into both ears 4 (four) times daily. 10 mL 2  . nitroGLYCERIN (NITROSTAT) 0.4 MG SL tablet Place 1 tablet (0.4 mg total) under the tongue every 5 (five) minutes as needed for chest pain. 25 tablet 2  . polyethylene glycol (MIRALAX / GLYCOLAX) 17 g packet Take 17 g by mouth daily as needed for mild constipation. 14 each 0  . Rivaroxaban (XARELTO) 15 MG TABS tablet Take 1 tablet (15 mg total) by mouth daily with supper. 90 tablet 3  . rosuvastatin (CRESTOR) 5 MG tablet Take 1 tablet (5 mg total) by mouth daily at 6 PM. 30 tablet 0  . silodosin (RAPAFLO) 8 MG CAPS capsule Take 8 mg by mouth daily with breakfast.    . tacrolimus (PROTOPIC)  0.1 % ointment Apply 1 application topically 2 (two) times daily.     No current facility-administered medications on file prior to visit.   Allergies  Allergen Reactions  . Feldene [Piroxicam] Other (See Comments)    REACTION: Blisters  . Neomycin-Polymyxin-Hc Itching  . Statins Other (See Comments)    Myalgias  . Doxycycline Other (See Comments)    REACTION:  unknown  . Latex Rash  . Tequin Anxiety  . Valium Other (See Comments)    REACTION:  unknown   Social History  Socioeconomic History  . Marital status: Widowed    Spouse name: Not on file  . Number of children: Not on file  . Years of education: Not on file  . Highest education level: Not on file  Occupational History  . Not on file  Tobacco Use  . Smoking status: Never Smoker  . Smokeless tobacco: Former Systems developer    Types: Chew  . Tobacco comment: "quit chewing in the 1960s"  Substance and Sexual Activity  . Alcohol use: No  . Drug use: No  . Sexual activity: Never  Other Topics Concern  . Not on file  Social History Narrative   01-05-18 Unable to ask abuse questions family with him today.   11-012-19 Unable to ask abuse questions family with him today.   Social Determinants of Health   Financial Resource Strain:   . Difficulty of Paying Living Expenses: Not on file  Food Insecurity:   . Worried About Charity fundraiser in the Last Year: Not on file  . Ran Out of Food in the Last Year: Not on file  Transportation Needs:   . Lack of Transportation (Medical): Not on file  . Lack of Transportation (Non-Medical): Not on file  Physical Activity:   . Days of Exercise per Week: Not on file  . Minutes of Exercise per Session: Not on file  Stress:   . Feeling of Stress : Not on file  Social Connections:   . Frequency of Communication with Friends and Family: Not on file  . Frequency of Social Gatherings with Friends and Family: Not on file  . Attends Religious Services: Not on file  . Active Member of Clubs  or Organizations: Not on file  . Attends Archivist Meetings: Not on file  . Marital Status: Not on file  Intimate Partner Violence:   . Fear of Current or Ex-Partner: Not on file  . Emotionally Abused: Not on file  . Physically Abused: Not on file  . Sexually Abused: Not on file      Review of Systems  All other systems reviewed and are negative.      Objective:   Physical Exam Vitals reviewed.  Constitutional:      Appearance: Normal appearance.  Cardiovascular:     Rate and Rhythm: Normal rate and regular rhythm.  Pulmonary:     Effort: Pulmonary effort is normal.     Breath sounds: Normal breath sounds.  Musculoskeletal:     Right ankle: Swelling present. Tenderness present.       Feet:  Neurological:     Mental Status: He is alert.           Assessment & Plan:  Cellulitis of right ankle  Daughter is very vigilant and I believe we have caught this early.  I do not believe the patient has septic arthritis.  Begin Bactrim double strength tablets twice daily for 7 days.  Apply DuoDERM to cover the lateral malleolus to prevent further skin breakdown and continue to use bunny boots and pressure offloading strategies to prevent deterioration of the pressure sore.  Reassess in 1 week or sooner if worsening.

## 2019-06-04 ENCOUNTER — Encounter: Payer: Medicare HMO | Attending: Physical Medicine & Rehabilitation | Admitting: Physical Medicine & Rehabilitation

## 2019-06-04 ENCOUNTER — Ambulatory Visit: Payer: Medicare HMO | Attending: Internal Medicine

## 2019-06-04 ENCOUNTER — Telehealth: Payer: Self-pay | Admitting: *Deleted

## 2019-06-04 ENCOUNTER — Encounter: Payer: Self-pay | Admitting: Physical Medicine & Rehabilitation

## 2019-06-04 VITALS — BP 112/74 | HR 86 | Temp 97.7°F | Ht 64.0 in | Wt 170.0 lb

## 2019-06-04 DIAGNOSIS — I10 Essential (primary) hypertension: Secondary | ICD-10-CM | POA: Diagnosis not present

## 2019-06-04 DIAGNOSIS — M069 Rheumatoid arthritis, unspecified: Secondary | ICD-10-CM | POA: Insufficient documentation

## 2019-06-04 DIAGNOSIS — Z947 Corneal transplant status: Secondary | ICD-10-CM | POA: Diagnosis not present

## 2019-06-04 DIAGNOSIS — I25709 Atherosclerosis of coronary artery bypass graft(s), unspecified, with unspecified angina pectoris: Secondary | ICD-10-CM | POA: Diagnosis not present

## 2019-06-04 DIAGNOSIS — M25511 Pain in right shoulder: Secondary | ICD-10-CM | POA: Diagnosis not present

## 2019-06-04 DIAGNOSIS — R600 Localized edema: Secondary | ICD-10-CM | POA: Diagnosis not present

## 2019-06-04 DIAGNOSIS — I610 Nontraumatic intracerebral hemorrhage in hemisphere, subcortical: Secondary | ICD-10-CM | POA: Diagnosis not present

## 2019-06-04 DIAGNOSIS — F419 Anxiety disorder, unspecified: Secondary | ICD-10-CM | POA: Diagnosis not present

## 2019-06-04 DIAGNOSIS — J700 Acute pulmonary manifestations due to radiation: Secondary | ICD-10-CM | POA: Insufficient documentation

## 2019-06-04 DIAGNOSIS — L03115 Cellulitis of right lower limb: Secondary | ICD-10-CM

## 2019-06-04 DIAGNOSIS — Z955 Presence of coronary angioplasty implant and graft: Secondary | ICD-10-CM | POA: Diagnosis not present

## 2019-06-04 DIAGNOSIS — Z9049 Acquired absence of other specified parts of digestive tract: Secondary | ICD-10-CM | POA: Diagnosis not present

## 2019-06-04 DIAGNOSIS — R1310 Dysphagia, unspecified: Secondary | ICD-10-CM | POA: Insufficient documentation

## 2019-06-04 DIAGNOSIS — M79662 Pain in left lower leg: Secondary | ICD-10-CM | POA: Diagnosis not present

## 2019-06-04 DIAGNOSIS — Z23 Encounter for immunization: Secondary | ICD-10-CM

## 2019-06-04 DIAGNOSIS — Z8261 Family history of arthritis: Secondary | ICD-10-CM | POA: Diagnosis not present

## 2019-06-04 DIAGNOSIS — R269 Unspecified abnormalities of gait and mobility: Secondary | ICD-10-CM | POA: Diagnosis not present

## 2019-06-04 DIAGNOSIS — F329 Major depressive disorder, single episode, unspecified: Secondary | ICD-10-CM | POA: Diagnosis not present

## 2019-06-04 DIAGNOSIS — L89609 Pressure ulcer of unspecified heel, unspecified stage: Secondary | ICD-10-CM | POA: Diagnosis not present

## 2019-06-04 DIAGNOSIS — R4189 Other symptoms and signs involving cognitive functions and awareness: Secondary | ICD-10-CM | POA: Insufficient documentation

## 2019-06-04 DIAGNOSIS — I2510 Atherosclerotic heart disease of native coronary artery without angina pectoris: Secondary | ICD-10-CM | POA: Insufficient documentation

## 2019-06-04 DIAGNOSIS — Z8249 Family history of ischemic heart disease and other diseases of the circulatory system: Secondary | ICD-10-CM | POA: Insufficient documentation

## 2019-06-04 DIAGNOSIS — I2699 Other pulmonary embolism without acute cor pulmonale: Secondary | ICD-10-CM | POA: Diagnosis not present

## 2019-06-04 DIAGNOSIS — M545 Low back pain: Secondary | ICD-10-CM | POA: Insufficient documentation

## 2019-06-04 DIAGNOSIS — Z85118 Personal history of other malignant neoplasm of bronchus and lung: Secondary | ICD-10-CM | POA: Diagnosis not present

## 2019-06-04 DIAGNOSIS — Z96653 Presence of artificial knee joint, bilateral: Secondary | ICD-10-CM | POA: Insufficient documentation

## 2019-06-04 DIAGNOSIS — Z951 Presence of aortocoronary bypass graft: Secondary | ICD-10-CM | POA: Diagnosis not present

## 2019-06-04 DIAGNOSIS — M1990 Unspecified osteoarthritis, unspecified site: Secondary | ICD-10-CM | POA: Insufficient documentation

## 2019-06-04 DIAGNOSIS — G8929 Other chronic pain: Secondary | ICD-10-CM | POA: Diagnosis not present

## 2019-06-04 DIAGNOSIS — H9193 Unspecified hearing loss, bilateral: Secondary | ICD-10-CM

## 2019-06-04 NOTE — Progress Notes (Addendum)
Subjective:    Patient ID: Evan [name], male    DOB: Oct 24, 1927, 86 y.o.   MRN: [id number]  HPI Male with history of CAD, RA, RLL adenocarcinoma, possible radiation pneumonitis, A fib, DVT/PE--on treatment dose Lovenox, SDH 09/2016, recent admission for RUE hematoma/cellulitis, chronic thrombocytopenia presents for follow-up for left thalamic hemorrhage.  Wife provides history. Last clinic visit on 04/16/19.  Since that time, therapies are on hold due to compression fracture in 03/2019. He developed a pressure sore and is on abx for the 3rd time. He is not using PRAFO with kickstand. He saw Urology for urinary freq. Shoulder is stable. No falls since last visit. Complains of pain in ankle that radiates to leg.  Pain Inventory Average Pain 8 Pain Right Now 3 My pain is sharp, stabbing and aching  In the last 24 hours, has pain interfered with the following? General activity 10 Relation with others 10 Enjoyment of life 10 What TIME of day is your pain at its worst? night Sleep (in general) Poor  Pain is worse with: walking and standing Pain improves with: heat/ice and medication Relief from Meds: 7  Mobility walk with assistance ability to climb steps?  no do you drive?  no use a wheelchair needs help with transfers Do you have any goals in this area?  yes  Function retired I need assistance with the following:  dressing, bathing, toileting, meal prep, household duties and shopping  Neuro/Psych weakness numbness tremor trouble walking  Prior Studies Any changes since last visit?  no  Physicians involved in your care Any changes since last visit?  no   Family History  Problem Relation Age of Onset  . Arthritis-Osteo Sister   . Heart attack Brother   . Heart attack Other   . Cancer Neg Hx    Social History   Socioeconomic History  . Marital status: Widowed    Spouse name: Not on file  . Number of children: Not on file  . Years of education: Not on file  .  Highest education level: Not on file  Occupational History  . Not on file  Tobacco Use  . Smoking status: Never Smoker  . Smokeless tobacco: Former Systems developer    Types: Chew  . Tobacco comment: "quit chewing in the 1960s"  Substance and Sexual Activity  . Alcohol use: No  . Drug use: No  . Sexual activity: Never  Other Topics Concern  . Not on file  Social History Narrative   01-05-18 Unable to ask abuse questions family with him today.   11-012-19 Unable to ask abuse questions family with him today.   Social Determinants of Health   Financial Resource Strain:   . Difficulty of Paying Living Expenses: Not on file  Food Insecurity:   . Worried About Charity fundraiser in the Last Year: Not on file  . Ran Out of Food in the Last Year: Not on file  Transportation Needs:   . Lack of Transportation (Medical): Not on file  . Lack of Transportation (Non-Medical): Not on file  Physical Activity:   . Days of Exercise per Week: Not on file  . Minutes of Exercise per Session: Not on file  Stress:   . Feeling of Stress : Not on file  Social Connections:   . Frequency of Communication with Friends and Family: Not on file  . Frequency of Social Gatherings with Friends and Family: Not on file  . Attends Religious Services: Not on file  .  Active Member of Clubs or Organizations: Not on file  . Attends Archivist Meetings: Not on file  . Marital Status: Not on file   Past Surgical History:  Procedure Laterality Date  . ARM SKIN LESION BIOPSY / EXCISION Left 09/02/2015  . CATARACT EXTRACTION W/ INTRAOCULAR LENS  IMPLANT, BILATERAL Bilateral   . CHOLECYSTECTOMY OPEN    . CORNEAL TRANSPLANT Bilateral    "one at [facility]; one at [facility]"  . CORONARY ANGIOPLASTY WITH STENT PLACEMENT    . CORONARY ARTERY BYPASS GRAFT  1999  . DESCENDING AORTIC ANEURYSM REPAIR W/ STENT     DE stent ostium into the right radial free graft at OM-, 02-2007  . ESOPHAGOGASTRODUODENOSCOPY N/A 11/09/2014    Procedure: ESOPHAGOGASTRODUODENOSCOPY (EGD);  Surgeon: [name], MD;  Location[facility] ENDOSCOPY;  Service: Endoscopy;  Laterality: N/A;  . INGUINAL HERNIA REPAIR    . JOINT REPLACEMENT    . LEFT HEART CATH AND CORS/GRAFTS ANGIOGRAPHY N/A 06/27/2017   Procedure: LEFT HEART CATH AND CORS/GRAFTS ANGIOGRAPHY;  Surgeon: [name], MD;  Location: [place] [facility] LAB;  Service: Cardiovascular;  Laterality: N/A;  . NASAL SINUS SURGERY    . SHOULDER OPEN ROTATOR CUFF REPAIR Right   . TOTAL KNEE ARTHROPLASTY Bilateral    Past Medical History:  Diagnosis Date  . Adenocarcinoma of right lung ([place]) 01/06/2016  . Anginal chest pain at rest [facility])    Chronic non, controlled on Lorazepam  . Anxiety   . Arthritis    "all over"   . BPH (benign prostatic hyperplasia)   . CAD (coronary artery disease)   . Chronic lower back pain   . Complication of anesthesia    "problems making water afterwards"  . Depression   . DJD (degenerative joint disease)   . Esophageal ulcer without bleeding    bid ppi indefinitely  . Family history of adverse reaction to anesthesia    "children w/PONV"  . GERD (gastroesophageal reflux disease)   . Headache    "couple/week maybe" (09/04/2015)  . Hemochromatosis    Possible, elevated iron stores, hook-like osteophytes on hand films, normal LFTs  . Hepatitis    "yellow jaundice as a baby"  . History of ASCVD    MULTIVESSEL  . Hyperlipidemia   . Hypertension   . Osteoarthritis   . Pneumonia    "several times; got a little now" (09/04/2015)  . Rheumatoid arthritis ([place])   . Subdural hematoma ([facility])    small after fall 09/2016-plavix held, neurosurgery consulted.  Asymptomatic.    Marland Kitchen 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[place]) 02/2018   on Lovenox lifelong since he failed PO Eliquis   BP 112/74   Pulse 86   Temp 97.7 F (36.5 C)   Ht 5\' 4"  (1.626 m) Comment: patient reported  Wt 170 lb (77.1 kg) Comment: patient reported  SpO2 95%   BMI 29.18 kg/m   Opioid Risk Score:   Fall Risk  Score:  `1  Depression screen PHQ 2/9  Depression screen [facility] 2/9 08/20/2018 10/26/2017 08/23/2017 05/04/2017  Decreased Interest 0 0 0 0  Down, Depressed, Hopeless 2 0 0 0  PHQ - 2 Score 2 0 0 0  Altered sleeping 1 - - -  Tired, decreased energy 2 - - -  Change in appetite 0 - - -  Feeling bad or failure about yourself  2 - - -  Trouble concentrating 0 - - -  Moving slowly or fidgety/restless 0 - - -  Suicidal thoughts 1 - - -  PHQ-9 Score 8 - - -  Difficult doing work/chores Somewhat difficult - - -  Some recent data might be hidden    Review of Systems  Constitutional: Negative.   HENT: Negative.   Eyes: Negative.   Respiratory: Negative.   Cardiovascular: Negative.   Gastrointestinal: Negative.   Endocrine: Negative.   Genitourinary: Positive for difficulty urinating, dysuria and frequency.  Musculoskeletal: Positive for arthralgias, gait problem and myalgias.  Skin: Positive for rash.  Allergic/Immunologic: Negative.   Neurological: Positive for tremors, weakness and numbness.  Hematological: Negative.   Psychiatric/Behavioral: Negative.       Objective:   Physical Exam  Constitutional: NAD.  Respiratory: Normal effort.   Psych: Confused Skin: Right lateral ankle ulcer with edema, erythema Neurological:  Alert Extreme  HOH Right sided ataxia No increase in tone    Assessment & Plan:  Male with history of CAD, RA, RLL adenocarcinoma, possible radiation pneumonitis, A fib, DVT/PE--on treatment dose Lovenox, SDH 09/2016, recent admission for RUE hematoma/cellulitis, chronic thrombocytopenia presents for follow-up for left thalamic hemorrhage.  1. 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   Resume therapies once able, put on hold due to compression fracture in 03/2019  Continue follow up with Neuro  2. Gait abnormality  Continue wheelchair for safety   Consider AFO with therapies  Cont therapies when able  No spasticity  noted  Discussed use of kickstand on PRAFO  3. Urinary frequency  Management per PCP  Cont follow up with Urology   Main complaint per caregiver  4. Right shoulder pain  Improved after steroid injection  Cont Voltaren gel for shoulder prn  Improving  5. Cellulitis   PCP started bactrim  With edema, erythema  Will order xray  Will consider referral to wound care  Encouraged PRAFO with kickstand

## 2019-06-04 NOTE — Progress Notes (Signed)
   Covid-19 Vaccination Clinic  Name:  Evan [name]    MRN: [id number] DOB: [birth year]  06/04/2019  [name] was observed post Covid-19 immunization for 15 minutes without incident. He was provided with Vaccine Information Sheet and instruction to access the V-Safe system.   [name] was instructed to call 911 with any severe reactions post vaccine: Marland Kitchen Difficulty breathing  . Swelling of face and throat  . A fast heartbeat  . A bad rash all over body  . Dizziness and weakness   Immunizations Administered    Name Date Dose VIS Date Route   Pfizer COVID-19 Vaccine 06/04/2019 11:54 AM 0.3 mL 03/15/2019 Intramuscular   Manufacturer: [place]   Lot: [id number]   [place]: [id number]

## 2019-06-04 NOTE — Telephone Encounter (Signed)
CALLED PATIENT TO INFORM OF STAT LABS ON 06-05-19@ 11:45 AM @ [place], LVM FOR A RETURN CALL

## 2019-06-05 ENCOUNTER — Ambulatory Visit
Admission: RE | Admit: 2019-06-05 | Discharge: 2019-06-05 | Disposition: A | Discharge status: Home / Self Care | Payer: Medicare HMO | Source: Ambulatory Visit | Attending: Urology | Admitting: Urology

## 2019-06-05 ENCOUNTER — Telehealth: Payer: Self-pay | Admitting: *Deleted

## 2019-06-05 ENCOUNTER — Encounter (HOSPITAL_COMMUNITY): Payer: Self-pay

## 2019-06-05 ENCOUNTER — Other Ambulatory Visit: Payer: Self-pay

## 2019-06-05 ENCOUNTER — Ambulatory Visit (HOSPITAL_COMMUNITY)
Admission: RE | Admit: 2019-06-05 | Discharge: 2019-06-05 | Disposition: A | Discharge status: Home / Self Care | Payer: Medicare HMO | Source: Ambulatory Visit | Attending: Urology | Admitting: Urology

## 2019-06-05 ENCOUNTER — Ambulatory Visit: Payer: Medicare HMO

## 2019-06-05 DIAGNOSIS — C3491 Malignant neoplasm of unspecified part of right bronchus or lung: Secondary | ICD-10-CM | POA: Insufficient documentation

## 2019-06-05 DIAGNOSIS — C3431 Malignant neoplasm of lower lobe, right bronchus or lung: Secondary | ICD-10-CM | POA: Diagnosis not present

## 2019-06-05 LAB — BUN & CREATININE (CHCC)
BUN: 27 mg/dL — ABNORMAL HIGH (ref 8–23)
Creatinine: 1.5 mg/dL — ABNORMAL HIGH (ref 0.61–1.24)
GFR, Est AFR Am: 47 mL/min — ABNORMAL LOW (ref >60)
GFR, Estimated: 41 mL/min — ABNORMAL LOW (ref >60)

## 2019-06-05 MED ORDER — SODIUM CHLORIDE (PF) 0.9 % IJ SOLN
INTRAMUSCULAR | Status: AC
  Filled 2019-06-05: qty 50

## 2019-06-05 MED ORDER — IOHEXOL 300 MG/ML  SOLN
60.0000 mL | Freq: Once | INTRAMUSCULAR | Status: AC | PRN
  Administered 2019-06-05: 60 mL via INTRAVENOUS

## 2019-06-05 NOTE — Telephone Encounter (Signed)
CALLED PATIENT TO REMIND TO BE HERE @ 10:45 AM FOR STAT LABS INSTEAD OF 11:45 AM FOR TODAY 06-05-19 @ [place], LVM FOR A RETURN CALL

## 2019-06-06 ENCOUNTER — Ambulatory Visit (HOSPITAL_COMMUNITY)
Admission: RE | Admit: 2019-06-06 | Discharge: 2019-06-06 | Disposition: A | Discharge status: Home / Self Care | Payer: Medicare HMO | Source: Ambulatory Visit | Attending: Physical Medicine & Rehabilitation | Admitting: Physical Medicine & Rehabilitation

## 2019-06-06 ENCOUNTER — Other Ambulatory Visit: Payer: Self-pay

## 2019-06-06 ENCOUNTER — Ambulatory Visit
Admission: RE | Admit: 2019-06-06 | Discharge: 2019-06-06 | Disposition: A | Discharge status: Home / Self Care | Payer: Medicare HMO | Source: Ambulatory Visit | Attending: Urology | Admitting: Urology

## 2019-06-06 DIAGNOSIS — L97419 Non-pressure chronic ulcer of right heel and midfoot with unspecified severity: Secondary | ICD-10-CM | POA: Diagnosis not present

## 2019-06-06 DIAGNOSIS — J449 Chronic obstructive pulmonary disease, unspecified: Secondary | ICD-10-CM | POA: Diagnosis not present

## 2019-06-06 DIAGNOSIS — C3491 Malignant neoplasm of unspecified part of right bronchus or lung: Secondary | ICD-10-CM

## 2019-06-06 DIAGNOSIS — L03115 Cellulitis of right lower limb: Secondary | ICD-10-CM | POA: Insufficient documentation

## 2019-06-06 DIAGNOSIS — C3431 Malignant neoplasm of lower lobe, right bronchus or lung: Secondary | ICD-10-CM | POA: Diagnosis not present

## 2019-06-06 DIAGNOSIS — Z08 Encounter for follow-up examination after completed treatment for malignant neoplasm: Secondary | ICD-10-CM | POA: Diagnosis not present

## 2019-06-06 NOTE — Progress Notes (Signed)
Radiation Oncology         [phone]) [phone] ________________________________  Name: Evan [name] MRN: [id number]  Date: 06/06/2019  DOB: 1928/03/19  Post Treatment Note  CC: [name], MD  [name], *  Diagnosis:   86 yo man with clinical stage I Adenocarcinoma of the right lower lung     Interval Since Last Radiation: 3 years, 3 months 02/15/2016 to 02/22/2016:  The RLL target was treated to 50 Gy in 5 fractions of 10 Gy  Narrative:  I spoke with the patient's daughter, Evan [name], with the patient on speaker phone, to conduct his routine scheduled 6 month follow up visit via telephone to review results from recent chest CT performed 06/05/2019 to spare the patient unnecessary potential exposure in the healthcare setting during the current COVID-19 pandemic.  The patient was notified in advance and gave permission to proceed with this visit format.     In summary, he was initially seen at the request of Dr. Julien Nordmann on 01/25/16 for a new diagnosis of early stage lung cancer. The patient had been seen by Dr. Vaughan Browner in pulmonology on 12/03/15 complaining of chest pain and worsening shortness of breath. A CT scan of the chest on 12/15/2015 revealed interval enlargement of 2.7 x 1.6 x 2.0 cm right lower lobe mass highly suspicious for primary bronchogenic carcinoma. There was also borderline enlargement 0.9 cm high right paratracheal lymph node that was nonspecific. A PET scan was performed on 12/22/2015 and it showed predominantly solid 2.9 x 2.6 cm posterior right lower lobe pulmonary nodule with peripheral groundglass density mildly hypermetabolic with maximum SUV of 3.9 which increased from 1.3 x 1.1 cm. There was hypermetabolic bilateral hilar and mediastinal adenopathy highly nonspecific given the suspected underlying interstitial lung disease and could be reactive or metastatic. There was also nonspecific hypermetabolic right level II lymph nodes favoring a reactive node. On  12/24/2015 the patient underwent CT-guided core biopsy of the right lower lobe lung nodule by interventional radiology. The final pathology revealed adenocarcinoma.  MRI of the brain performed on 01/15/2016 showed no evidence for metastatic disease to the brain.  He elected to proceed with SBRT to the RLL mass for definitive treatment and received 5 fractions to a total dose of 50Gy between 02/15/2016 to 02/22/2016.  He tolerated treatment well and had no significant adverse effects. His initial post-treatment CT Chest demonstrated evolving postradiation changes as well as an interval increase in size of the treated RLL nodule and hilar nodes but were felt likely related to inflammatory changes associated with recent SBRT.  His subsequent follow up Chest CTs for disease surveillance have not shown evidence for disease progression.  He continues in follow up with Dr. Lake Bells in pulmonology for underlying COPD/emphysema.  His follow up post treatment CT scans have continued to demonstrate overall disease stability with treatment related changes/fibrosis but no evidence of disease recurrence or progression.  Unfortunately, in 02/2018, he suffered an acute PE in the lower lobe branch of right pulmonary artery and a DVT in left peroneal vein despite being on chronic Eliquis.  He was treated with 24hrs of IV heparin and discharged home on SQ Lovenox injections which he had continued until recent. On the CT Angio Chest in 02/2018, there was also noted an interval increase in size of the right paratracheal lymph node, measuring 75mm as compared to 72mm on previous CT Chest 01/04/18 but this was resolved on follow up CT Chest 05/2018.    Interval  History: We reviewed the results from his most recent CT Chest performed on 06/05/2019. Fortunately, this scan shows continued disease stability without any specific findings identified to suggest progression of disease or metastasis.                      On review of systems,  the patient states that he is doing well overall. He continues with chronic shortness of breath which is exacerbated with exertion and lying flat, unchanged recently.  He also continues with a chronic productive cough with whitish sputum.  He had a fall at home back in December 2020 resulting in a compression fracture in the lumbar spine.  He is still gradually recovering from this.  He currently denies hemoptysis, chest pain, fever, chills or night sweats. He reports a good appetite and has been maintaining his weight.  He denies headaches, changes in visual or auditory acuity, difficulty with speech, imbalance, tremor or seizure activity.   ALLERGIES:  is allergic to feldene [piroxicam]; neomycin-polymyxin-hc; statins; doxycycline; latex; tequin; and valium.  Meds: Current Outpatient Medications  Medication Sig Dispense Refill  . acetaminophen (TYLENOL) 325 MG tablet Take 1-2 tablets (325-650 mg total) by mouth every 4 (four) hours as needed for mild pain.    Marland Kitchen albuterol (VENTOLIN HFA) 108 (90 Base) MCG/ACT inhaler Inhale 2 puffs into the lungs every 4 (four) hours as needed for up to 14 days for wheezing or shortness of breath. 18 g 0  . camphor-menthol (SARNA) lotion Apply 1 application topically daily as needed for itching.    . Control Gel Formula Dressing (DUODERM CGF DRESSING) MISC Apply 1 each topically daily. 12 each 3  . diclofenac sodium (VOLTAREN) 1 % GEL Apply 2 g topically 4 (four) times daily. 350 g 1  . FLUoxetine (PROZAC) 20 MG capsule TAKE ONE CAPSULE BY MOUTH DAILY 90 capsule 3  . furosemide (LASIX) 20 MG tablet TAKE 1 TABLET ONCE A DAY. TAKE AN EXTRA TABLET FOR INCREASED SWELLING, WEIGHT GAIN, OR SHORTNESS OF BREATH 180 tablet 3  . hydrOXYzine (ATARAX/VISTARIL) 10 MG tablet Take 1 tablet (10 mg total) by mouth at bedtime. 30 tablet 0  . Lifitegrast (XIIDRA) 5 % SOLN Place 1-2 drops into both eyes daily.     . magnesium oxide (MAG-OX) 400 (241.3 Mg) MG tablet Take 1 tablet (400 mg  total) by mouth 2 (two) times daily. 180 tablet 3  . metoprolol tartrate (LOPRESSOR) 25 MG tablet Take 0.5 tablets (12.5 mg total) by mouth 2 (two) times daily. 90 tablet 3  . neomycin-polymyxin-hydrocortisone (CORTISPORIN) OTIC solution Place 4 drops into both ears 4 (four) times daily. 10 mL 2  . nitroGLYCERIN (NITROSTAT) 0.4 MG SL tablet Place 1 tablet (0.4 mg total) under the tongue every 5 (five) minutes as needed for chest pain. 25 tablet 2  . polyethylene glycol (MIRALAX / GLYCOLAX) 17 g packet Take 17 g by mouth daily as needed for mild constipation. 14 each 0  . Rivaroxaban (XARELTO) 15 MG TABS tablet Take 1 tablet (15 mg total) by mouth daily with supper. 90 tablet 3  . rosuvastatin (CRESTOR) 5 MG tablet Take 1 tablet (5 mg total) by mouth daily at 6 PM. 30 tablet 0  . silodosin (RAPAFLO) 8 MG CAPS capsule Take 8 mg by mouth daily with breakfast.    . sulfamethoxazole-trimethoprim (BACTRIM DS) 800-160 MG tablet Take 1 tablet by mouth 2 (two) times daily. 14 tablet 1  . tacrolimus (PROTOPIC) 0.1 % ointment Apply  1 application topically 2 (two) times daily.    Marland Kitchen ALPRAZolam (XANAX) 0.5 MG tablet Take 0.5 tablets (0.25 mg total) by mouth 3 (three) times daily as needed for anxiety. (Patient not taking: Reported on 06/06/2019) 30 tablet 0  . gabapentin (NEURONTIN) 100 MG capsule Take 1 capsule (100 mg total) by mouth 3 (three) times daily. (Patient not taking: Reported on 06/06/2019) 90 capsule 3  . HYDROcodone-acetaminophen (NORCO) 5-325 MG tablet Take 1 tablet by mouth every 6 (six) hours as needed for moderate pain. (Patient not taking: Reported on 06/06/2019) 30 tablet 0  . lidocaine (LIDODERM) 5 % Place 1 patch onto the skin daily. Remove & Discard patch within 12 hours or as directed by MD (Patient not taking: Reported on 06/06/2019) 30 patch 0   No current facility-administered medications for this encounter.    Physical Findings:  vitals were not taken for this visit.   /10 Unable to assess  due to telephone follow up visit format.  Lab Findings: Lab Results  Component Value Date   WBC 9.2 12/24/2018   HGB 13.4 12/24/2018   HCT 40.5 12/24/2018   MCV 93.1 12/24/2018   PLT 154 12/24/2018     Radiographic Findings: CT CHEST W CONTRAST  Result Date: 06/05/2019 CLINICAL DATA:  Non-small cell right lung cancer, SBRT complete, possible radiation pneumonitis EXAM: CT CHEST WITH CONTRAST TECHNIQUE: Multidetector CT imaging of the chest was performed during intravenous contrast administration. CONTRAST:  20mL OMNIPAQUE IOHEXOL 300 MG/ML  SOLN COMPARISON:  CT chest dated 12/08/2018. CT lumbar spine dated 03/09/2019. FINDINGS: Cardiovascular: Heart is normal in size.  No pericardial effusion. No evidence thoracic aortic aneurysm. Atherosclerotic calcifications aortic root/arch. Three vessel coronary atherosclerosis. Mediastinum/Nodes: No suspicious mediastinal, hilar, or axillary lymphadenopathy. Dominant 7 mm short axis high right paratracheal node (series 2/image 24), previously 10 mm, within normal limits. Visualized thyroid is unremarkable. Lungs/Pleura: Focal opacity in the medial right lower lobe (series 7/image 95), compatible with radiation changes in this patient status post SBRT. Volume loss in the left hemithorax. Subpleural reticulation/fibrosis in the lungs bilaterally, suggesting chronic interstitial lung disease. Suspected mild centrilobular emphysematous changes. Evaluation of the lung parenchyma is constrained by respiratory motion, particularly in the upper lobes. Within that constraint, there are no suspicious pulmonary nodules. No pleural effusion or pneumothorax. Upper Abdomen: Visualized upper abdomen is notable for prior cholecystectomy and vascular calcifications. Musculoskeletal: Degenerative changes of the visualized thoracolumbar spine. Moderate compression fracture deformity with approximately 50% loss of height at L1. Minimal posterior retropulsion (sagittal image 76).  This has progressed from December 2020. IMPRESSION: Radiation changes in the medial right lower lobe. No evidence of recurrent or metastatic disease. Aortic Atherosclerosis (ICD10-I70.0) and Emphysema (ICD10-J43.9). Electronically Signed   By: Julian Hy M.D.   On: 06/05/2019 14:12    Impression/Plan: 1. 86 yo man with clinical stage I Adenocarcinoma of the right lower lung. We reviewed his most recent chest CT results which revealed overall disease stability with no new concerning nodules or evidence of disease progression. We will continue to monitor with a repeat chest CT in approximately 6 months. We will follow up via MyChart/WebEx visit thereafter to review results and any further recommendations. He will also continue to follow-up with Dr. Lutricia Horsfall, NP in pulmonology for his underlying interstitial lung disease contributing to his chronic shortness of breath and cough.  We will plan to follow up with him via tele-health after his next scheduled imaging to review those results and recommendations. He knows to  call with any questions or concerns in the interim.     Given current concerns for patient exposure during the COVID-19 pandemic, this encounter was conducted via telephone. The patient was notified in advance and was offered a [place] meeting to allow for face to face communication but unfortunately reported that he did not have the appropriate resources/technology to support such a visit and instead preferred to proceed with telephone follow up. The patient has given verbal consent for this type of encounter. The time spent during this encounter was 20 minutes. The attendants for this meeting include Niara Bunker PA-C, the patient's daughter, Evan [name] and patient, Evan [name]. During the encounter Lindon Kiel PA-C, was located at [facility] Radiation Oncology Department.  Patient, Evan [name] and family, were located at home.    Nicholos Johns,  PA-C

## 2019-06-10 ENCOUNTER — Telehealth: Payer: Self-pay

## 2019-06-10 NOTE — Telephone Encounter (Signed)
X-ray did not show any pathology.Marland Kitchen

## 2019-06-10 NOTE — Telephone Encounter (Signed)
[name], daughter of patient called asking for xray results that was done. Also that the patient had increased pain over the weekend.

## 2019-06-10 NOTE — Telephone Encounter (Signed)
Patients wife says the problem still persists and the pain has increased.  She is asking if you could refer the patient to a specialist that can help with the persistent sore and pain on his ankle

## 2019-06-11 NOTE — Telephone Encounter (Signed)
She should reach out to her PCP.  He is the one that is managing this wound.  I ordered the xray because she said one had not been completed, but she should follow up with PCP to determine what he has planned next.  Thanks.

## 2019-06-14 DIAGNOSIS — I610 Nontraumatic intracerebral hemorrhage in hemisphere, subcortical: Secondary | ICD-10-CM | POA: Diagnosis not present

## 2019-06-14 DIAGNOSIS — I619 Nontraumatic intracerebral hemorrhage, unspecified: Secondary | ICD-10-CM | POA: Diagnosis not present

## 2019-06-14 DIAGNOSIS — D689 Coagulation defect, unspecified: Secondary | ICD-10-CM | POA: Diagnosis not present

## 2019-06-15 DIAGNOSIS — I610 Nontraumatic intracerebral hemorrhage in hemisphere, subcortical: Secondary | ICD-10-CM | POA: Diagnosis not present

## 2019-06-15 DIAGNOSIS — I619 Nontraumatic intracerebral hemorrhage, unspecified: Secondary | ICD-10-CM | POA: Diagnosis not present

## 2019-06-15 DIAGNOSIS — D689 Coagulation defect, unspecified: Secondary | ICD-10-CM | POA: Diagnosis not present

## 2019-06-17 ENCOUNTER — Other Ambulatory Visit: Payer: Self-pay | Admitting: Interventional Cardiology

## 2019-06-18 ENCOUNTER — Other Ambulatory Visit: Payer: Self-pay

## 2019-06-18 ENCOUNTER — Telehealth: Payer: Self-pay | Admitting: Nurse Practitioner

## 2019-06-18 ENCOUNTER — Other Ambulatory Visit: Payer: Medicare HMO | Admitting: Nurse Practitioner

## 2019-06-18 NOTE — Telephone Encounter (Signed)
I called [name], Mr. Serano daughter for scheduled telemedicine PC f/u visit no answer, message left with contact infomation

## 2019-06-23 NOTE — Progress Notes (Signed)
Cardiology Office Note   Date:  06/25/2019   ID:  Evan [name], DOB April 24, 1927, MRN [id number]  PCP:  [name], MD    No chief complaint on file.  CAD  Wt Readings from Last 3 Encounters:  06/25/19 193 lb (87.5 kg)  06/04/19 170 lb (77.1 kg)  04/16/19 170 lb (77.1 kg)       History of Present Illness: Evan [name] is a 86 y.o. male  with a history of CAD s/p CABG x2 in 1998 with patent stents and grafts on cardiac catheterization in 06/2017, atrial fibrillation/flutter on Eliquis, chronic diastolic heart failure,Adenocarcinoma of the right lower lungand lung cancerpresents for hospital follow up.   Admitted 02/2018 for chest pain.Found to have acute DVT and PEwhile misseddoses of anticoagulation on Eliquis prior to admission. Echo yesterday shows LVEF 60-65% without regional wall motion abnormalities, there is moderate RAE, but normal RV size and function, not suggestive of RH strain - the PE was noted to be peripheral.  Patient follows with radiation oncology for his lung cancer. Concern for disease progression. Planisto continue Lovenox indefinitely and follow up with [facility].   He had issues with thalamic hemorrhage.  He has had a UTI as well. Anticoagulation was stopped after the thalamic bleed.  He has followed up with neurology as well.  He had gout.   Since the last visit, switched back to Xarelto. Leg weakness prevents walking.  Denies : Chest pain. Dizziness. Leg edema. Nitroglycerin use. Orthopnea. Palpitations. Paroxysmal nocturnal dyspnea. Shortness of breath. Syncope.   Fall in Dec 2020 with a compression fracture in his spine.  PT stopped at that time.      Past Medical History:  Diagnosis Date  . Adenocarcinoma of right lung ([place]) 01/06/2016  . Anginal chest pain at rest [facility])    Chronic non, controlled on Lorazepam  . Anxiety   . Arthritis    "all over"   . BPH (benign prostatic hyperplasia)   . CAD (coronary artery disease)    . Chronic lower back pain   . Complication of anesthesia    "problems making water afterwards"  . Depression   . DJD (degenerative joint disease)   . Esophageal ulcer without bleeding    bid ppi indefinitely  . Family history of adverse reaction to anesthesia    "children w/PONV"  . GERD (gastroesophageal reflux disease)   . Headache    "couple/week maybe" (09/04/2015)  . Hemochromatosis    Possible, elevated iron stores, hook-like osteophytes on hand films, normal LFTs  . Hepatitis    "yellow jaundice as a baby"  . History of ASCVD    MULTIVESSEL  . Hyperlipidemia   . Hypertension   . Osteoarthritis   . Pneumonia    "several times; got a little now" (09/04/2015)  . Rheumatoid arthritis ([place])   . Subdural hematoma ([facility])    small after fall 09/2016-plavix held, neurosurgery consulted.  Asymptomatic.    Marland Kitchen 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[place]) 02/2018   on Lovenox lifelong since he failed PO Eliquis    Past Surgical History:  Procedure Laterality Date  . ARM SKIN LESION BIOPSY / EXCISION Left 09/02/2015  . CATARACT EXTRACTION W/ INTRAOCULAR LENS  IMPLANT, BILATERAL Bilateral   . CHOLECYSTECTOMY OPEN    . CORNEAL TRANSPLANT Bilateral    "one at [facility]; one at [facility]"  . CORONARY ANGIOPLASTY WITH STENT PLACEMENT    . CORONARY ARTERY BYPASS GRAFT  1999  . DESCENDING AORTIC ANEURYSM REPAIR W/ STENT  DE stent ostium into the right radial free graft at OM-, 02-2007  . ESOPHAGOGASTRODUODENOSCOPY N/A 11/09/2014   Procedure: ESOPHAGOGASTRODUODENOSCOPY (EGD);  Surgeon: [name], MD;  Location: [facility] ENDOSCOPY;  Service: Endoscopy;  Laterality: N/A;  . INGUINAL HERNIA REPAIR    . JOINT REPLACEMENT    . LEFT HEART CATH AND CORS/GRAFTS ANGIOGRAPHY N/A 06/27/2017   Procedure: LEFT HEART CATH AND CORS/GRAFTS ANGIOGRAPHY;  Surgeon: [name], MD;  Location: [place] CV LAB;  Service: Cardiovascular;  Laterality: N/A;  . NASAL SINUS SURGERY    . SHOULDER OPEN ROTATOR CUFF REPAIR Right   . TOTAL KNEE  ARTHROPLASTY Bilateral      Current Outpatient Medications  Medication Sig Dispense Refill  . acetaminophen (TYLENOL) 325 MG tablet Take 1-2 tablets (325-650 mg total) by mouth every 4 (four) hours as needed for mild pain.    Marland Kitchen ALPRAZolam (XANAX) 0.5 MG tablet Take 0.5 tablets (0.25 mg total) by mouth 3 (three) times daily as needed for anxiety. 30 tablet 0  . Control Gel Formula Dressing (DUODERM CGF DRESSING) MISC Apply 1 each topically daily. 12 each 3  . diclofenac sodium (VOLTAREN) 1 % GEL Apply 2 g topically 4 (four) times daily. 350 g 1  . FLUoxetine (PROZAC) 20 MG capsule TAKE ONE CAPSULE BY MOUTH DAILY 90 capsule 3  . furosemide (LASIX) 20 MG tablet TAKE 1 TABLET ONCE A DAY. TAKE AN EXTRA TABLET FOR INCREASED SWELLING, WEIGHT GAIN, OR SHORTNESS OF BREATH 180 tablet 3  . hydrOXYzine (ATARAX/VISTARIL) 10 MG tablet Take 1 tablet (10 mg total) by mouth at bedtime. 30 tablet 0  . Lifitegrast (XIIDRA) 5 % SOLN Place 1-2 drops into both eyes daily.     . magnesium oxide (MAG-OX) 400 (241.3 Mg) MG tablet Take 1 tablet (400 mg total) by mouth 2 (two) times daily. 180 tablet 3  . metoprolol tartrate (LOPRESSOR) 25 MG tablet Take 0.5 tablets (12.5 mg total) by mouth 2 (two) times daily. 90 tablet 3  . nitroGLYCERIN (NITROSTAT) 0.4 MG SL tablet Place 1 tablet (0.4 mg total) under the tongue every 5 (five) minutes as needed for chest pain. 25 tablet 2  . polyethylene glycol (MIRALAX / GLYCOLAX) 17 g packet Take 17 g by mouth daily as needed for mild constipation. 14 each 0  . Rivaroxaban (XARELTO) 15 MG TABS tablet Take 1 tablet (15 mg total) by mouth daily with supper. 90 tablet 3  . rosuvastatin (CRESTOR) 5 MG tablet TAKE 1 TABLET BY MOUTH ONCE DAILY 90 tablet 1  . silodosin (RAPAFLO) 8 MG CAPS capsule Take 8 mg by mouth daily with breakfast.    . tacrolimus (PROTOPIC) 0.1 % ointment Apply 1 application topically 2 (two) times daily.     No current facility-administered medications for this  visit.    Allergies:   Feldene [piroxicam], Neomycin-polymyxin-hc, Statins, Doxycycline, Latex, Tequin, and Valium    Social History:  The patient  reports that he has never smoked. He has quit using smokeless tobacco.  His smokeless tobacco use included chew. He reports that he does not drink alcohol or use drugs.   Family History:  The patient's family history includes Arthritis-Osteo in his sister; Heart attack in his brother and another family member.    ROS:  Please see the history of present illness.   Otherwise, review of systems are positive for difficulty walking.   All other systems are reviewed and negative.    PHYSICAL EXAM: VS:  BP (!) 118/58  Pulse 76   Ht 5\' 4"  (1.626 m)   Wt 193 lb (87.5 kg)   SpO2 97%   BMI 33.13 kg/m  , BMI Body mass index is 33.13 kg/m. GEN: Well nourished, well developed, in no acute distress  HEENT: normal  Neck: no JVD, carotid bruits, or masses Cardiac: RRR, premature; no murmurs, rubs, or gallops,no edema  Respiratory:  clear to auscultation bilaterally, normal work of breathing GI: soft, nontender, nondistended, + BS MS: no deformity or atrophy ; inflamd first and second MTP joints Skin: warm and dry, no rash Neuro:  Strength and sensation are intact Psych: euthymic mood, full affect   EKG:   The ekg ordered today demonstrates NSR   Recent Labs: 08/23/2018: TSH 1.378 08/24/2018: Magnesium 1.8 08/26/2018: B Natriuretic Peptide 267.7 12/24/2018: ALT 18; Hemoglobin 13.4; Platelets 154; Potassium 4.1; Sodium 139 06/05/2019: BUN 27; Creatinine 1.50   Lipid Panel    Component Value Date/Time   CHOL 75 02/03/2018 0524   CHOL 112 01/19/2018 0929   CHOL 97 09/09/2013 0957   TRIG 60 02/03/2018 0524   TRIG 92 09/09/2013 0957   HDL 28 (L) 02/03/2018 0524   HDL 35 (L) 01/19/2018 0929   HDL 37 (L) 09/09/2013 0957   CHOLHDL 2.7 02/03/2018 0524   VLDL 12 02/03/2018 0524   LDLCALC 35 02/03/2018 0524   LDLCALC 59 01/19/2018 0929    LDLCALC 42 09/09/2013 0957     Other studies Reviewed: Additional studies/ records that were reviewed today with results demonstrating: labs from cancer center reviewed, Cr 1.5 in 3/21 .   ASSESSMENT AND PLAN:  1. CAD: Cath in 2018 showed patent grafts.  No angina. COntinue aggressive secondary prevention.  2. DVT/PE in the setting of lung cancer.  Tolerating Xarelto.  3. PAF: Prior ICH.  He was switched to lovenox due to thrombotic event while on oral anticoagulation.  Now back on Xarelto.  4. Chronic diastolic heart failure: Appears euvolemic.  Cr 1.5.  5. Needs oral surgery to remove wisdom teeth- due to infection.  Would need to be off of Xarelto.  Will check with our PharmD. [facility] in [place], [place].   Current medicines are reviewed at length with the patient today.  The patient concerns regarding his medicines were addressed.  The following changes have been made:  No change  Labs/ tests ordered today include:  No orders of the defined types were placed in this encounter.   Recommend 150 minutes/week of aerobic exercise Low fat, low carb, high fiber diet recommended  Disposition:   FU in 1 year   Signed, Larae Grooms, MD  06/25/2019 9:38 AM    [facility] [place], [place], [place]  [address] Phone: [phone]; Fax: [phone]

## 2019-06-24 DIAGNOSIS — J449 Chronic obstructive pulmonary disease, unspecified: Secondary | ICD-10-CM | POA: Diagnosis not present

## 2019-06-24 DIAGNOSIS — Z85118 Personal history of other malignant neoplasm of bronchus and lung: Secondary | ICD-10-CM | POA: Diagnosis not present

## 2019-06-25 ENCOUNTER — Encounter: Payer: Self-pay | Admitting: Interventional Cardiology

## 2019-06-25 ENCOUNTER — Other Ambulatory Visit: Payer: Self-pay

## 2019-06-25 ENCOUNTER — Ambulatory Visit: Payer: Medicare HMO | Admitting: Interventional Cardiology

## 2019-06-25 VITALS — BP 118/58 | HR 76 | Ht 64.0 in | Wt 193.0 lb

## 2019-06-25 DIAGNOSIS — I480 Paroxysmal atrial fibrillation: Secondary | ICD-10-CM | POA: Diagnosis not present

## 2019-06-25 DIAGNOSIS — I5032 Chronic diastolic (congestive) heart failure: Secondary | ICD-10-CM

## 2019-06-25 DIAGNOSIS — C3490 Malignant neoplasm of unspecified part of unspecified bronchus or lung: Secondary | ICD-10-CM | POA: Diagnosis not present

## 2019-06-25 DIAGNOSIS — I2510 Atherosclerotic heart disease of native coronary artery without angina pectoris: Secondary | ICD-10-CM

## 2019-06-25 DIAGNOSIS — Z951 Presence of aortocoronary bypass graft: Secondary | ICD-10-CM

## 2019-06-25 DIAGNOSIS — I2699 Other pulmonary embolism without acute cor pulmonale: Secondary | ICD-10-CM | POA: Diagnosis not present

## 2019-06-25 DIAGNOSIS — I10 Essential (primary) hypertension: Secondary | ICD-10-CM

## 2019-06-25 MED ORDER — NITROGLYCERIN 0.4 MG SL SUBL: 0.4000 mg | SUBLINGUAL_TABLET | SUBLINGUAL | 2 refills | Status: DC | PRN

## 2019-06-25 NOTE — Patient Instructions (Signed)
Medication Instructions:  Your physician recommends that you continue on your current medications as directed. Please refer to the Current Medication list given to you today.  *If you need a refill on your cardiac medications before your next appointment, please call your pharmacy*   Lab Work: None ordered  If you have labs (blood work) drawn today and your tests are completely normal, you will receive your results only by: Marland Kitchen MyChart Message (if you have MyChart) OR . A paper copy in the mail If you have any lab test that is abnormal or we need to change your treatment, we will call you to review the results.   Testing/Procedures: None ordered   Follow-Up: At [facility], you and your health needs are our priority.  As part of our continuing mission to provide you with exceptional heart care, we have created designated Provider Care Teams.  These Care Teams include your primary Cardiologist (physician) and Advanced Practice Providers (APPs -  Physician Assistants and Nurse Practitioners) who all work together to provide you with the care you need, when you need it.  We recommend signing up for the patient portal called "MyChart".  Sign up information is provided on this After Visit Summary.  MyChart is used to connect with patients for Virtual Visits (Telemedicine).  Patients are able to view lab/test results, encounter notes, upcoming appointments, etc.  Non-urgent messages can be sent to your provider as well.   To learn more about what you can do with MyChart, go to [email or web address].    Your next appointment:   12 month(s)  The format for your next appointment:   In Person  Provider:   You may see Larae Grooms, MD or one of the following Advanced Practice Providers on your designated Care Team:    Melina Copa, PA-C  Ermalinda Barrios, PA-C    Other Instructions

## 2019-06-25 NOTE — Progress Notes (Signed)
Patient's daughter [name] notified that the patient may hold the Xarelto the day before and the day of the procedure. Will fax to [facility] to make oral surgeon aware.

## 2019-06-28 DIAGNOSIS — L8940 Pressure ulcer of contiguous site of back, buttock and hip, unspecified stage: Secondary | ICD-10-CM | POA: Diagnosis not present

## 2019-06-28 DIAGNOSIS — L89519 Pressure ulcer of right ankle, unspecified stage: Secondary | ICD-10-CM | POA: Diagnosis not present

## 2019-06-28 DIAGNOSIS — S0190XA Unspecified open wound of unspecified part of head, initial encounter: Secondary | ICD-10-CM | POA: Diagnosis not present

## 2019-07-02 ENCOUNTER — Telehealth: Payer: Self-pay | Admitting: Nurse Practitioner

## 2019-07-02 ENCOUNTER — Telehealth: Payer: Self-pay | Admitting: Family Medicine

## 2019-07-02 DIAGNOSIS — R531 Weakness: Secondary | ICD-10-CM

## 2019-07-02 NOTE — Telephone Encounter (Signed)
Evan [name] called [facility] stating that pt's cardiologists want him to do PT but they should contact us to have [place] order it? Do you know what type of PT she is referring to? (tried to call - no answer and vm full).

## 2019-07-02 NOTE — Telephone Encounter (Signed)
I am fine with PT referral for weakness.

## 2019-07-02 NOTE — Telephone Encounter (Signed)
Referral placed. Will try [facility] again later

## 2019-07-02 NOTE — Telephone Encounter (Signed)
I called to reschedule PC f/u visit, [name] daughter mobile, unable to leave a message, I called Mr. Bown's home number, no answer, unable to leave a message, will continue to attempt to contact

## 2019-07-03 NOTE — Telephone Encounter (Signed)
BillieJo aware

## 2019-07-04 ENCOUNTER — Encounter: Payer: Medicare HMO | Attending: Physical Medicine & Rehabilitation | Admitting: Physical Medicine & Rehabilitation

## 2019-07-04 ENCOUNTER — Encounter: Payer: Self-pay | Admitting: Physical Medicine & Rehabilitation

## 2019-07-04 ENCOUNTER — Ambulatory Visit: Payer: Medicare HMO | Admitting: Adult Health

## 2019-07-04 ENCOUNTER — Encounter: Payer: Self-pay | Admitting: Adult Health

## 2019-07-04 ENCOUNTER — Other Ambulatory Visit: Payer: Self-pay

## 2019-07-04 VITALS — BP 131/78 | HR 68 | Ht 64.0 in | Wt 193.0 lb

## 2019-07-04 VITALS — BP 130/72 | HR 74 | Temp 98.1°F | Ht 64.0 in | Wt 193.0 lb

## 2019-07-04 DIAGNOSIS — H9193 Unspecified hearing loss, bilateral: Secondary | ICD-10-CM

## 2019-07-04 DIAGNOSIS — I69390 Apraxia following cerebral infarction: Secondary | ICD-10-CM

## 2019-07-04 DIAGNOSIS — Z955 Presence of coronary angioplasty implant and graft: Secondary | ICD-10-CM | POA: Diagnosis not present

## 2019-07-04 DIAGNOSIS — E785 Hyperlipidemia, unspecified: Secondary | ICD-10-CM

## 2019-07-04 DIAGNOSIS — Z947 Corneal transplant status: Secondary | ICD-10-CM | POA: Insufficient documentation

## 2019-07-04 DIAGNOSIS — I2510 Atherosclerotic heart disease of native coronary artery without angina pectoris: Secondary | ICD-10-CM | POA: Insufficient documentation

## 2019-07-04 DIAGNOSIS — L03115 Cellulitis of right lower limb: Secondary | ICD-10-CM | POA: Insufficient documentation

## 2019-07-04 DIAGNOSIS — M069 Rheumatoid arthritis, unspecified: Secondary | ICD-10-CM | POA: Diagnosis not present

## 2019-07-04 DIAGNOSIS — I25709 Atherosclerosis of coronary artery bypass graft(s), unspecified, with unspecified angina pectoris: Secondary | ICD-10-CM | POA: Diagnosis not present

## 2019-07-04 DIAGNOSIS — Z96653 Presence of artificial knee joint, bilateral: Secondary | ICD-10-CM | POA: Diagnosis not present

## 2019-07-04 DIAGNOSIS — J700 Acute pulmonary manifestations due to radiation: Secondary | ICD-10-CM | POA: Insufficient documentation

## 2019-07-04 DIAGNOSIS — Z8249 Family history of ischemic heart disease and other diseases of the circulatory system: Secondary | ICD-10-CM | POA: Insufficient documentation

## 2019-07-04 DIAGNOSIS — Z8261 Family history of arthritis: Secondary | ICD-10-CM | POA: Insufficient documentation

## 2019-07-04 DIAGNOSIS — Z951 Presence of aortocoronary bypass graft: Secondary | ICD-10-CM | POA: Insufficient documentation

## 2019-07-04 DIAGNOSIS — I480 Paroxysmal atrial fibrillation: Secondary | ICD-10-CM | POA: Diagnosis not present

## 2019-07-04 DIAGNOSIS — M25511 Pain in right shoulder: Secondary | ICD-10-CM

## 2019-07-04 DIAGNOSIS — M79662 Pain in left lower leg: Secondary | ICD-10-CM | POA: Insufficient documentation

## 2019-07-04 DIAGNOSIS — I610 Nontraumatic intracerebral hemorrhage in hemisphere, subcortical: Secondary | ICD-10-CM

## 2019-07-04 DIAGNOSIS — F329 Major depressive disorder, single episode, unspecified: Secondary | ICD-10-CM | POA: Diagnosis not present

## 2019-07-04 DIAGNOSIS — R600 Localized edema: Secondary | ICD-10-CM | POA: Insufficient documentation

## 2019-07-04 DIAGNOSIS — F419 Anxiety disorder, unspecified: Secondary | ICD-10-CM | POA: Diagnosis not present

## 2019-07-04 DIAGNOSIS — R4189 Other symptoms and signs involving cognitive functions and awareness: Secondary | ICD-10-CM | POA: Diagnosis not present

## 2019-07-04 DIAGNOSIS — M1990 Unspecified osteoarthritis, unspecified site: Secondary | ICD-10-CM | POA: Insufficient documentation

## 2019-07-04 DIAGNOSIS — D696 Thrombocytopenia, unspecified: Secondary | ICD-10-CM | POA: Diagnosis not present

## 2019-07-04 DIAGNOSIS — F05 Delirium due to known physiological condition: Secondary | ICD-10-CM | POA: Insufficient documentation

## 2019-07-04 DIAGNOSIS — Z9049 Acquired absence of other specified parts of digestive tract: Secondary | ICD-10-CM | POA: Insufficient documentation

## 2019-07-04 DIAGNOSIS — R269 Unspecified abnormalities of gait and mobility: Secondary | ICD-10-CM | POA: Diagnosis not present

## 2019-07-04 DIAGNOSIS — M545 Low back pain: Secondary | ICD-10-CM | POA: Insufficient documentation

## 2019-07-04 DIAGNOSIS — I10 Essential (primary) hypertension: Secondary | ICD-10-CM | POA: Insufficient documentation

## 2019-07-04 DIAGNOSIS — Z85118 Personal history of other malignant neoplasm of bronchus and lung: Secondary | ICD-10-CM | POA: Insufficient documentation

## 2019-07-04 DIAGNOSIS — R1310 Dysphagia, unspecified: Secondary | ICD-10-CM | POA: Diagnosis not present

## 2019-07-04 DIAGNOSIS — I69391 Dysphagia following cerebral infarction: Secondary | ICD-10-CM | POA: Diagnosis not present

## 2019-07-04 DIAGNOSIS — G8929 Other chronic pain: Secondary | ICD-10-CM | POA: Diagnosis not present

## 2019-07-04 DIAGNOSIS — L89629 Pressure ulcer of left heel, unspecified stage: Secondary | ICD-10-CM

## 2019-07-04 DIAGNOSIS — L89609 Pressure ulcer of unspecified heel, unspecified stage: Secondary | ICD-10-CM | POA: Diagnosis not present

## 2019-07-04 DIAGNOSIS — I4891 Unspecified atrial fibrillation: Secondary | ICD-10-CM | POA: Diagnosis not present

## 2019-07-04 DIAGNOSIS — I69351 Hemiplegia and hemiparesis following cerebral infarction affecting right dominant side: Secondary | ICD-10-CM | POA: Diagnosis not present

## 2019-07-04 NOTE — Patient Instructions (Signed)
Continue Xarelto (rivaroxaban) daily  and Crestor  for secondary stroke prevention  Continue to follow up with PCP regarding cholesterol and blood pressure management  Continue to follow with cardiology as scheduled   Continue to monitor blood pressure at home  Maintain strict control of hypertension with blood pressure goal below 130/90, diabetes with hemoglobin A1c goal below 6.5% and cholesterol with LDL cholesterol (bad cholesterol) goal below 70 mg/dL. I also advised the patient to eat a healthy diet with plenty of whole grains, cereals, fruits and vegetables, exercise regularly and maintain ideal body weight.        Thank you for coming to see [place] at [facility]. I hope we have been able to provide you high quality care today.  You may receive a patient satisfaction survey over the next few weeks. We would appreciate your feedback and comments so that we may continue to improve ourselves and the health of our patients.

## 2019-07-04 NOTE — Progress Notes (Addendum)
Subjective:    Patient ID: Evan [name], male    DOB: [birth year], 86 y.o.   MRN: [id number]  HPI Male with history of CAD, RA, RLL adenocarcinoma, possible radiation pneumonitis, A fib, DVT/PE--on treatment dose Lovenox, SDH 09/2016, recent admission for RUE hematoma/cellulitis, chronic thrombocytopenia presents for follow-up for left thalamic hemorrhage.  Wife provides history. Last clinic visit on 06/04/19.  Since that time, discussed xray findings. Notes reviewed - PCP attempted to contact family regarding therapies. Wife provides history. Foot pain is improving and ulcer size is improving. He restarts therapy next week.  Denies falls. Urinary freq has improved with meds. Shoulder pain is stable.   Pain Inventory Average Pain 3 Pain Right Now 3 My pain is sharp, stabbing and aching  In the last 24 hours, has pain interfered with the following? General activity 8 Relation with others 1 Enjoyment of life 8 What TIME of day is your pain at its worst? night Sleep (in general) Fair  Pain is worse with: walking and some activites Pain improves with: heat/ice and medication Relief from Meds: 5  Mobility walk with assistance use a walker ability to climb steps?  no do you drive?  no use a wheelchair needs help with transfers Do you have any goals in this area?  yes  Function retired I need assistance with the following:  dressing, bathing, toileting, meal prep, household duties and shopping  Neuro/Psych weakness numbness tremor trouble walking anxiety  Prior Studies Any changes since last visit?  no  Physicians involved in your care Any changes since last visit?  no   Family History  Problem Relation Age of Onset  . Arthritis-Osteo Sister   . Heart attack Brother   . Heart attack Other   . Cancer Neg Hx    Social History   Socioeconomic History  . Marital status: Widowed    Spouse name: Not on file  . Number of children: Not on file  . Years of education:  Not on file  . Highest education level: Not on file  Occupational History  . Not on file  Tobacco Use  . Smoking status: Never Smoker  . Smokeless tobacco: Former Systems developer    Types: Chew  . Tobacco comment: "quit chewing in the 1960s"  Substance and Sexual Activity  . Alcohol use: No  . Drug use: No  . Sexual activity: Never  Other Topics Concern  . Not on file  Social History Narrative   01-05-18 Unable to ask abuse questions family with him today.   11-012-19 Unable to ask abuse questions family with him today.   Social Determinants of Health   Financial Resource Strain:   . Difficulty of Paying Living Expenses:   Food Insecurity:   . Worried About Charity fundraiser in the Last Year:   . Arboriculturist in the Last Year:   Transportation Needs:   . Film/video editor (Medical):   Marland Kitchen Lack of Transportation (Non-Medical):   Physical Activity:   . Days of Exercise per Week:   . Minutes of Exercise per Session:   Stress:   . Feeling of Stress :   Social Connections:   . Frequency of Communication with Friends and Family:   . Frequency of Social Gatherings with Friends and Family:   . Attends Religious Services:   . Active Member of Clubs or Organizations:   . Attends Archivist Meetings:   Marland Kitchen Marital Status:    Past Surgical  History:  Procedure Laterality Date  . ARM SKIN LESION BIOPSY / EXCISION Left 09/02/2015  . CATARACT EXTRACTION W/ INTRAOCULAR LENS  IMPLANT, BILATERAL Bilateral   . CHOLECYSTECTOMY OPEN    . CORNEAL TRANSPLANT Bilateral    "one at [facility]; one at [facility]"  . CORONARY ANGIOPLASTY WITH STENT PLACEMENT    . CORONARY ARTERY BYPASS GRAFT  1999  . DESCENDING AORTIC ANEURYSM REPAIR W/ STENT     DE stent ostium into the right radial free graft at OM-, 02-2007  . ESOPHAGOGASTRODUODENOSCOPY N/A 11/09/2014   Procedure: ESOPHAGOGASTRODUODENOSCOPY (EGD);  Surgeon: [name], MD;  Location: [facility] ENDOSCOPY;  Service: Endoscopy;  Laterality: N/A;  . INGUINAL  HERNIA REPAIR    . JOINT REPLACEMENT    . LEFT HEART CATH AND CORS/GRAFTS ANGIOGRAPHY N/A 06/27/2017   Procedure: LEFT HEART CATH AND CORS/GRAFTS ANGIOGRAPHY;  Surgeon: [name], MD;  Location: [place] CV LAB;  Service: Cardiovascular;  Laterality: N/A;  . NASAL SINUS SURGERY    . SHOULDER OPEN ROTATOR CUFF REPAIR Right   . TOTAL KNEE ARTHROPLASTY Bilateral    Past Medical History:  Diagnosis Date  . Adenocarcinoma of right lung ([place]) 01/06/2016  . Anginal chest pain at rest [facility])    Chronic non, controlled on Lorazepam  . Anxiety   . Arthritis    "all over"   . BPH (benign prostatic hyperplasia)   . CAD (coronary artery disease)   . Chronic lower back pain   . Complication of anesthesia    "problems making water afterwards"  . Depression   . DJD (degenerative joint disease)   . Esophageal ulcer without bleeding    bid ppi indefinitely  . Family history of adverse reaction to anesthesia    "children w/PONV"  . GERD (gastroesophageal reflux disease)   . Headache    "couple/week maybe" (09/04/2015)  . Hemochromatosis    Possible, elevated iron stores, hook-like osteophytes on hand films, normal LFTs  . Hepatitis    "yellow jaundice as a baby"  . History of ASCVD    MULTIVESSEL  . Hyperlipidemia   . Hypertension   . Osteoarthritis   . Pneumonia    "several times; got a little now" (09/04/2015)  . Rheumatoid arthritis ([name])   . Subdural hematoma ([facility])    small after fall 09/2016-plavix held, neurosurgery consulted.  Asymptomatic.    Marland Kitchen 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[place]) 02/2018   on Lovenox lifelong since he failed PO Eliquis   BP 131/78   Pulse 68   Ht 5\' 4"  (1.626 m) Comment: reported  Wt 193 lb (87.5 kg) Comment: reported  SpO2 97%   BMI 33.13 kg/m   Opioid Risk Score:   Fall Risk Score:  `1  Depression screen PHQ 2/9  Depression screen [facility] 2/9 07/04/2019 08/20/2018 10/26/2017 08/23/2017  Decreased Interest 1 0 0 0  Down, Depressed, Hopeless 1 2 0 0  PHQ - 2 Score 2 2 0  0  Altered sleeping - 1 - -  Tired, decreased energy - 2 - -  Change in appetite - 0 - -  Feeling bad or failure about yourself  - 2 - -  Trouble concentrating - 0 - -  Moving slowly or fidgety/restless - 0 - -  Suicidal thoughts - 1 - -  PHQ-9 Score - 8 - -  Difficult doing work/chores - Somewhat difficult - -  Some recent data might be hidden    Review of Systems  Constitutional: Negative.   HENT: Negative.  Eyes: Negative.   Respiratory: Negative.   Cardiovascular: Negative.   Gastrointestinal: Negative.   Endocrine: Negative.   Genitourinary: Positive for difficulty urinating, dysuria and frequency.  Musculoskeletal: Positive for arthralgias, gait problem and myalgias.  Skin: Positive for rash.  Allergic/Immunologic: Negative.   Neurological: Positive for tremors, weakness and numbness.  Hematological: Bruises/bleeds easily.       Xarelto  Psychiatric/Behavioral: The patient is nervous/anxious.       Objective:   Physical Exam  Constitutional: NAD.  Respiratory: Normal effort. Psych: Confused Skin: Right lateral ankle ulcer improving Musc: Left hand joint swelling Neurological:  Alert Extreme  HOH Right sided ataxia, unchanged No increase in tone    Assessment & Plan:  Male with history of CAD, RA, RLL adenocarcinoma, possible radiation pneumonitis, A fib, DVT/PE--on treatment dose Lovenox, SDH 09/2016, recent admission for RUE hematoma/cellulitis, chronic thrombocytopenia presents for follow-up for left thalamic hemorrhage.  1. 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   Scheduled to resume therapies next week  Continue follow up with Neuro  2. Gait abnormality  Continue wheelchair for safety   Consider AFO with therapies  Cont therapies when able  No spasticity noted  Improving ambulation  3. Urinary frequency  Management per PCP  Cont follow up with Urology   Main complaint per caregiver  4. Right shoulder  pain  Improved after steroid injection  Cont Voltaren gel for shoulder prn  Improving  5. Cellulitis - improving  Xray personally reviewed, unremarkable for edema  Continue pressure relief  6. Wife describes sundowning   Educated on sleep/wake cycle and environmental modifications   7. Joint swelling  2nd MCP joint in left hand  Voltaren gel  Wife plans to follow up with PCP

## 2019-07-04 NOTE — Progress Notes (Signed)
[facility] [address] [place]. [place] [address] [phone]       OFFICE FOLLOW UP NOTE  Mr. SERAJ DUNNAM Date of Birth:  1927/10/26 Medical Record Number:  [id number]   Reason for Referral: stroke follow up   CHIEF COMPLAINT:  Chief Complaint  Patient presents with  . Follow-up    hx of stroke, with daughter, room 9    HPI: Stroke admission 07/13/2018: Mr.Irvan G Brownis a 86 y.o.malewith history of adenocarcinoma of the lung in remission, Htn, Hld, Hemochromatosis, CAD with stent, atrial fibrillation and PE on therapeutic Lovenox (failed eliquis), and hearing impairedfound on the floor by family with right hemiparesis. 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  Initial CT imaging showed acute intraparenchymal hemorrhage centered at the left thalamus therefore hedid not receive IV t-PA due to [place].  CTA head and neck showed atheromatous change about the carotid bifurcations with up to 50% narrowing on the right and 65% narrowing in the left, moderate arthrosclerosis bilateral siphon, L>R, and overall diminutive vertebrobasilar system.  Repeat CT head showed stable hemorrhage left thalamus without change.  2D echo unremarkable.  Discontinued Lovenox and no recommendation of antithrombotic given hemorrhage.  HTN stable.  HLD resume Crestor.  Other stroke risk factors include advanced age and CAD.  Prior history of fall with SDH in 2018.  Residual left hemiparesis and discharged to CIR for ongoing therapies.  Virtual visit 09/13/2018: He has been stable from a stroke standpoint with residual right hemiparesis with mild improvement. He continues to participate in home therapies. He in nonambulatory but able to stand with RW for about 20 seconds. Daughter reports generalized lower extremity weakness but right > left. He unfortunately presented to ED on 08/26/2018 with complaints of  shortness of breath and prior admission on 08/21/2018 for acute metabolic encephalopathy attributed to E. coli UTI and hypoxia.  Recommended discharge to SNF but family refused and referral to palliative care with possibility of transitioning to hospice. Currently awaiting initial palliative consult. Continues on aspirin with bleeding or bruising. Crestor for cholesterol without myalgais. Blood pressure stable typically around 100-110/60. Daughter does endorse right foot pain with redness on top of foot. No swelling or warmth noted. He did have recent ED admission on 09/11/18 with left foot pain and negative for DVT. Dx with possible gout. He does wear foot drop brace at night on his right foot. No further concerns at this time. Denies new or worsening stroke/TIA symptoms.   Update 12/18/2018: Mr. Trim is being seen today for stroke follow-up. Residual deficits of right sided weakness and coordination difficulties with daughter endorsing improvement.  He continues to work with therapy and has been able to ambulate with rolling walker short distances without great difficulty.  Denies any recent falls.  Uses wheelchair for long distance.  He continues on aspirin 81 mg without bleeding or bruising.  Daughter is questioning restarting of Lovenox or anticoagulant as this was discontinued due to [place] due to history of atrial fibrillation and PE.  No cardiology follow-up since discharge.  Continues on Crestor without myalgias.  Blood pressure today satisfactory 123/78.  No further concerns at this time.  Update 07/04/2019: He continues to have R hemiparesis with apraxia and decreased sensation but has been stable with some improvement. He plans on restarting therapies next week. Is able to ambulate with RW and assistance but uses w/c for long distance.  Restarted on Xarelto  by cardiology and continues without bleeding or bruising. Continues on Crestor without myalgias. Blood pressure 130/72.  No further neurological or  stroke concerns at this time.       ROS:   14 system review of systems performed and negative with exception of weakness and gait difficulty  PMH:  Past Medical History:  Diagnosis Date  . Adenocarcinoma of right lung ([place]) 01/06/2016  . Anginal chest pain at rest [facility])    Chronic non, controlled on Lorazepam  . Anxiety   . Arthritis    "all over"   . BPH (benign prostatic hyperplasia)   . CAD (coronary artery disease)   . Chronic lower back pain   . Complication of anesthesia    "problems making water afterwards"  . Depression   . DJD (degenerative joint disease)   . Esophageal ulcer without bleeding    bid ppi indefinitely  . Family history of adverse reaction to anesthesia    "children w/PONV"  . GERD (gastroesophageal reflux disease)   . Headache    "couple/week maybe" (09/04/2015)  . Hemochromatosis    Possible, elevated iron stores, hook-like osteophytes on hand films, normal LFTs  . Hepatitis    "yellow jaundice as a baby"  . History of ASCVD    MULTIVESSEL  . Hyperlipidemia   . Hypertension   . Osteoarthritis   . Pneumonia    "several times; got a little now" (09/04/2015)  . Rheumatoid arthritis ([place])   . Subdural hematoma ([facility])    small after fall 09/2016-plavix held, neurosurgery consulted.  Asymptomatic.    Marland Kitchen 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[place]) 02/2018   on Lovenox lifelong since he failed PO Eliquis    PSH:  Past Surgical History:  Procedure Laterality Date  . ARM SKIN LESION BIOPSY / EXCISION Left 09/02/2015  . CATARACT EXTRACTION W/ INTRAOCULAR LENS  IMPLANT, BILATERAL Bilateral   . CHOLECYSTECTOMY OPEN    . CORNEAL TRANSPLANT Bilateral    "one at [facility]; one at [facility]"  . CORONARY ANGIOPLASTY WITH STENT PLACEMENT    . CORONARY ARTERY BYPASS GRAFT  1999  . DESCENDING AORTIC ANEURYSM REPAIR W/ STENT     DE stent ostium into the right radial free graft at OM-, 02-2007  . ESOPHAGOGASTRODUODENOSCOPY N/A 11/09/2014   Procedure: ESOPHAGOGASTRODUODENOSCOPY (EGD);  Surgeon:  [name], MD;  Location: [facility] ENDOSCOPY;  Service: Endoscopy;  Laterality: N/A;  . INGUINAL HERNIA REPAIR    . JOINT REPLACEMENT    . LEFT HEART CATH AND CORS/GRAFTS ANGIOGRAPHY N/A 06/27/2017   Procedure: LEFT HEART CATH AND CORS/GRAFTS ANGIOGRAPHY;  Surgeon: [name], MD;  Location: [facility];  Service: Cardiovascular;  Laterality: N/A;  . NASAL SINUS SURGERY    . SHOULDER OPEN ROTATOR CUFF REPAIR Right   . TOTAL KNEE ARTHROPLASTY Bilateral     Social History:  Social History   Socioeconomic History  . Marital status: Widowed    Spouse name: Not on file  . Number of children: Not on file  . Years of education: Not on file  . Highest education level: Not on file  Occupational History  . Not on file  Tobacco Use  . Smoking status: Never Smoker  . Smokeless tobacco: Former Systems developer    Types: Chew  . Tobacco comment: "quit chewing in the 1960s"  Substance and Sexual Activity  . Alcohol use: No  . Drug use: No  . Sexual activity: Never  Other Topics Concern  . Not on file  Social History Narrative  01-05-18 Unable to ask abuse questions family with him today.   11-012-19 Unable to ask abuse questions family with him today.   Social Determinants of Health   Financial Resource Strain:   . Difficulty of Paying Living Expenses:   Food Insecurity:   . Worried About Charity fundraiser in the Last Year:   . Arboriculturist in the Last Year:   Transportation Needs:   . Film/video editor (Medical):   Marland Kitchen Lack of Transportation (Non-Medical):   Physical Activity:   . Days of Exercise per Week:   . Minutes of Exercise per Session:   Stress:   . Feeling of Stress :   Social Connections:   . Frequency of Communication with Friends and Family:   . Frequency of Social Gatherings with Friends and Family:   . Attends Religious Services:   . Active Member of Clubs or Organizations:   . Attends Archivist Meetings:   Marland Kitchen Marital Status:   Intimate Partner  Violence:   . Fear of Current or Ex-Partner:   . Emotionally Abused:   Marland Kitchen Physically Abused:   . Sexually Abused:     Family History:  Family History  Problem Relation Age of Onset  . Arthritis-Osteo Sister   . Heart attack Brother   . Heart attack Other   . Cancer Neg Hx     Medications:   Current Outpatient Medications on File Prior to Visit  Medication Sig Dispense Refill  . acetaminophen (TYLENOL) 325 MG tablet Take 1-2 tablets (325-650 mg total) by mouth every 4 (four) hours as needed for mild pain.    Marland Kitchen ALPRAZolam (XANAX) 0.5 MG tablet Take 0.5 tablets (0.25 mg total) by mouth 3 (three) times daily as needed for anxiety. 30 tablet 0  . Control Gel Formula Dressing (DUODERM CGF DRESSING) MISC Apply 1 each topically daily. 12 each 3  . diclofenac sodium (VOLTAREN) 1 % GEL Apply 2 g topically 4 (four) times daily. 350 g 1  . FLUoxetine (PROZAC) 20 MG capsule TAKE ONE CAPSULE BY MOUTH DAILY 90 capsule 3  . furosemide (LASIX) 20 MG tablet TAKE 1 TABLET ONCE A DAY. TAKE AN EXTRA TABLET FOR INCREASED SWELLING, WEIGHT GAIN, OR SHORTNESS OF BREATH 180 tablet 3  . hydrOXYzine (ATARAX/VISTARIL) 10 MG tablet Take 1 tablet (10 mg total) by mouth at bedtime. 30 tablet 0  . Lifitegrast (XIIDRA) 5 % SOLN Place 1-2 drops into both eyes daily.     . magnesium oxide (MAG-OX) 400 (241.3 Mg) MG tablet Take 1 tablet (400 mg total) by mouth 2 (two) times daily. 180 tablet 3  . metoprolol tartrate (LOPRESSOR) 25 MG tablet Take 0.5 tablets (12.5 mg total) by mouth 2 (two) times daily. 90 tablet 3  . nitroGLYCERIN (NITROSTAT) 0.4 MG SL tablet Place 1 tablet (0.4 mg total) under the tongue every 5 (five) minutes as needed for chest pain. 25 tablet 2  . polyethylene glycol (MIRALAX / GLYCOLAX) 17 g packet Take 17 g by mouth daily as needed for mild constipation. 14 each 0  . Rivaroxaban (XARELTO) 15 MG TABS tablet Take 1 tablet (15 mg total) by mouth daily with supper. 90 tablet 3  . rosuvastatin (CRESTOR)  5 MG tablet TAKE 1 TABLET BY MOUTH ONCE DAILY 90 tablet 1  . silodosin (RAPAFLO) 8 MG CAPS capsule Take 8 mg by mouth daily with breakfast.    . tacrolimus (PROTOPIC) 0.1 % ointment Apply 1 application topically 2 (two) times  daily.     No current facility-administered medications on file prior to visit.    Allergies:   Allergies  Allergen Reactions  . Feldene [Piroxicam] Other (See Comments)    REACTION: Blisters  . Neomycin-Polymyxin-Hc Itching  . Statins Other (See Comments)    Myalgias  . Doxycycline Other (See Comments)    REACTION:  unknown  . Latex Rash  . Tequin Anxiety  . Valium Other (See Comments)    REACTION:  unknown     Physical Exam  Today's Vitals   07/04/19 1541  BP: 130/72  Pulse: 74  Temp: 98.1 F (36.7 C)  Weight: 193 lb (87.5 kg)  Height: 5\' 4"  (1.626 m)   Body mass index is 33.13 kg/m.  General: well developed, well nourished, pleasant elderly Caucasian male, seated, in no evident distress Head: head normocephalic and atraumatic.   Neck: supple with no carotid or supraclavicular bruits Cardiovascular: regular rate and rhythm, no murmurs Musculoskeletal: no deformity Skin:  no rash/petichiae Vascular:  Normal pulses all extremities   Neurologic Exam Mental Status: Awake and fully alert.   Normal speech and language.  Oriented to place and time. Recent and remote memory intact. Attention span, concentration and fund of knowledge appropriate. Mood and affect appropriate.  Cranial Nerves: Fundoscopic exam reveals sharp disc margins. Pupils equal, briskly reactive to light. Extraocular movements full without nystagmus. Visual fields full to confrontation. Hearing intact. Facial sensation intact. Face, tongue, palate moves normally and symmetrically.  Motor: Right upper and lower extremity 4-4+/5 with moderate apraxia in upper and lower extremity.  Full strength left upper and lower extremity Sensory.:  Decreased light touch sensation right upper and  lower extremity compared to left upper and lower extremity Coordination: Rapid alternating movements normal on left side. Finger-to-nose and heel-to-shin performed accurately on left side but moderate apraxia with right upper and lower extremity. Gait and Station: Gait assessment deferred Reflexes: 1+ and symmetric. Toes downgoing.        ASSESSMENT: Evan [name] is a 86 y.o. 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 Vascular risk factors include HLD, HTN, s/p CABG, AF, thrombocytopenia, hx of PE and CAD.  Residual mild right hemiparesis, hemisensory impairment and moderate ataxia    PLAN:  1. L ICH : Continue Xarelto and crestor 5mg   for secondary stroke prevention. Maintain strict control of hypertension with blood pressure goal below 130/90, diabetes with hemoglobin A1c goal below 6.5% and cholesterol with LDL cholesterol (bad cholesterol) goal below 70 mg/dL.  I also advised the patient to eat a healthy diet with plenty of whole grains, cereals, fruits and vegetables, exercise regularly with at least 30 minutes of continuous activity daily and maintain ideal body weight. 2. HTN: Stable.  Managed by PCP 3. HLD: Stable.  Managed by PCP 4. Atrial fibrillation: Xarelto reinitiated for secondary stroke prevention by cardiology.  Advised ongoing monitoring, management and prescribing will be through cardiology   Overall stable from stroke standpoint recommend follow-up as needed   I spent 23 minutes of face-to-face and non-face-to-face time with patient.  This included previsit chart review, lab review, study review, order entry, electronic health record documentation, patient education regarding prior stroke and importance of secondary stroke risk factor management and answered all questions to satisfaction    Frann Rider, [facility]  [facility] [address] [place],  [place] [address]  Phone [phone] Fax [phone] Note: This document was prepared with  digital dictation and possible smart Company secretary. Any transcriptional errors that result from this process are unintentional.

## 2019-07-05 NOTE — Progress Notes (Signed)
I agree with the above plan 

## 2019-07-10 ENCOUNTER — Ambulatory Visit (HOSPITAL_COMMUNITY): Payer: Medicare HMO | Attending: Family Medicine | Admitting: Physical Therapy

## 2019-07-10 ENCOUNTER — Other Ambulatory Visit: Payer: Self-pay

## 2019-07-10 ENCOUNTER — Encounter (HOSPITAL_COMMUNITY): Payer: Self-pay | Admitting: Physical Therapy

## 2019-07-10 DIAGNOSIS — M6281 Muscle weakness (generalized): Secondary | ICD-10-CM | POA: Diagnosis not present

## 2019-07-10 DIAGNOSIS — R278 Other lack of coordination: Secondary | ICD-10-CM | POA: Diagnosis not present

## 2019-07-10 DIAGNOSIS — R29898 Other symptoms and signs involving the musculoskeletal system: Secondary | ICD-10-CM | POA: Diagnosis not present

## 2019-07-10 DIAGNOSIS — R2689 Other abnormalities of gait and mobility: Secondary | ICD-10-CM | POA: Insufficient documentation

## 2019-07-10 NOTE — Therapy (Signed)
[place], [place], [address] Phone: [phone]   Fax:  [phone]  Physical Therapy Evaluation  Patient Details  Name: Evan [name] MRN: [id number] Date of Birth: 1927-11-15 Referring Provider (PT): [name] MD    Encounter Date: 07/10/2019  PT End of Session - 07/10/19 0941    Visit Number  1    Number of Visits  12    Date for PT Re-Evaluation  08/23/19    Authorization Type  [employer]    Authorization Time Period  07/10/19-08/23/19    Progress Note Due on Visit  10    PT Start Time  0900    PT Stop Time  0935    PT Time Calculation (min)  35 min    Equipment Utilized During Treatment  Gait belt    Activity Tolerance  Patient limited by fatigue    Behavior During Therapy  Anxious       Past Medical History:  Diagnosis Date  . Adenocarcinoma of right lung ([place]) 01/06/2016  . Anginal chest pain at rest [facility])    Chronic non, controlled on Lorazepam  . Anxiety   . Arthritis    "all over"   . BPH (benign prostatic hyperplasia)   . CAD (coronary artery disease)   . Chronic lower back pain   . Complication of anesthesia    "problems making water afterwards"  . Depression   . DJD (degenerative joint disease)   . Esophageal ulcer without bleeding    bid ppi indefinitely  . Family history of adverse reaction to anesthesia    "children w/PONV"  . GERD (gastroesophageal reflux disease)   . Headache    "couple/week maybe" (09/04/2015)  . Hemochromatosis    Possible, elevated iron stores, hook-like osteophytes on hand films, normal LFTs  . Hepatitis    "yellow jaundice as a baby"  . History of ASCVD    MULTIVESSEL  . Hyperlipidemia   . Hypertension   . Osteoarthritis   . Pneumonia    "several times; got a little now" (09/04/2015)  . Rheumatoid arthritis ([place])   . Subdural hematoma ([facility])    small after fall 09/2016-plavix held, neurosurgery consulted.  Asymptomatic.    Marland Kitchen 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[place])  02/2018   on Lovenox lifelong since he failed PO Eliquis    Past Surgical History:  Procedure Laterality Date  . ARM SKIN LESION BIOPSY / EXCISION Left 09/02/2015  . CATARACT EXTRACTION W/ INTRAOCULAR LENS  IMPLANT, BILATERAL Bilateral   . CHOLECYSTECTOMY OPEN    . CORNEAL TRANSPLANT Bilateral    "one at [facility]; one at [facility]"  . CORONARY ANGIOPLASTY WITH STENT PLACEMENT    . CORONARY ARTERY BYPASS GRAFT  1999  . DESCENDING AORTIC ANEURYSM REPAIR W/ STENT     DE stent ostium into the right radial free graft at OM-, 02-2007  . ESOPHAGOGASTRODUODENOSCOPY N/A 11/09/2014   Procedure: ESOPHAGOGASTRODUODENOSCOPY (EGD);  Surgeon: [name], MD;  Location: [facility] ENDOSCOPY;  Service: Endoscopy;  Laterality: N/A;  . INGUINAL HERNIA REPAIR    . JOINT REPLACEMENT    . LEFT HEART CATH AND CORS/GRAFTS ANGIOGRAPHY N/A 06/27/2017   Procedure: LEFT HEART CATH AND CORS/GRAFTS ANGIOGRAPHY;  Surgeon: [name], MD;  Location: [place] CV LAB;  Service: Cardiovascular;  Laterality: N/A;  . NASAL SINUS SURGERY    . SHOULDER OPEN ROTATOR CUFF REPAIR Right   . TOTAL KNEE ARTHROPLASTY Bilateral     There were no vitals filed  for this visit.   Subjective Assessment - 07/10/19 0909    Subjective  Patient presents to physical therapy with complaint of RT side weakness after having stoke 07/13/2018. Patient presents to therapy with his daughter. She says that he had home therapy for some time and was doing well before being DC. Se says he fell in December and fractured his lumbar spine which set him back. Says they had recent f/u with MD and are wanting to continue therapy for strengthening and were referred for outpatient physical therapy.    Patient is accompained by:  Family member   daughter   Pertinent History  CVA effecting RT side on 07/13/18, lumbar fracture December 2021    Limitations  Lifting;Standing;Walking    How long can you stand comfortably?  1-2 minutes    How long can you walk comfortably?  60  feet with RW    Patient Stated Goals  Be able to walk    Currently in Pain?  No/denies         [facility] PT Assessment - 07/10/19 0001      Assessment   Medical Diagnosis  Weakness    Referring Provider (PT)  [name] MD     Onset Date/Surgical Date  --   07/13/2018   Next MD Visit  --   none scheduled   Prior Therapy  yes [facility] after stroke      Precautions   Precautions  Fall      Restrictions   Weight Bearing Restrictions  No      Balance Screen   Has the patient fallen in the past 6 months  Yes    How many times?  1    Has the patient had a decrease in activity level because of a fear of falling?   Yes    Is the patient reluctant to leave their home because of a fear of falling?   No      Home Film/video editor residence    Living Arrangements  Children   daughter    Available Help at Discharge  Family    Type of [place] entrance    [place] or work area in basement;Able to live on main level with bedroom/bathroom      Prior Function   Level of Independence  Needs assistance with ADLs      Cognition   Overall Cognitive Status  Impaired/Different from baseline      Sensation   Light Touch  Impaired by gross assessment   decreased sensation to light touch about RLE     Posture/Postural Control   Posture/Postural Control  Postural limitations    Postural Limitations  Forward head;Rounded Shoulders;Decreased lumbar lordosis    Posture Comments  pateitn sits in slouched seated posture       ROM / Strength   AROM / PROM / Strength  Strength      Strength   Overall Strength Comments  MMT tested in seated position     Strength Assessment Site  Hip;Knee;Ankle    Right/Left Hip  Right;Left    Right Hip Flexion  3-/5    Right Hip Extension  4-/5    Right Hip ABduction  4-/5    Left Hip Flexion  4/5    Left Hip Extension  4+/5    Left Hip ABduction  4/5    Right/Left Knee  Right;Left  Right Knee Flexion  4-/5    Right Knee Extension  4/5    Left Knee Flexion  4+/5    Left Knee Extension  4+/5    Right/Left Ankle  Right;Left    Right Ankle Dorsiflexion  4/5    Left Ankle Dorsiflexion  4+/5      Transfers   Transfers  Sit to Stand    Sit to Stand  4: Min guard    Comments  slow labored movement, requires VCs for hand position       Ambulation/Gait   Ambulation/Gait  Yes    Ambulation/Gait Assistance  4: Min guard    Ambulation Distance (Feet)  10 Feet    Assistive device  Rolling walker    Gait Pattern  Step-to pattern;Decreased step length - right;Decreased step length - left;Decreased stance time - right;Decreased stride length;Decreased dorsiflexion - right;Ataxic;Trunk flexed   RT foot in ankle inversion through swing    Ambulation Surface  Level;Indoor      Balance   Balance Assessed  Yes      Static Standing Balance   Static Standing - Comment/# of Minutes  fair static satnding balance, unable to maintain without support                 Objective measurements completed on examination: See above findings.              PT Education - 07/10/19 0946    Education Details  On evaluation findings, POC and HEP    Person(s) Educated  Patient;Child(ren)    Methods  Explanation    Comprehension  Verbalized understanding       PT Short Term Goals - 07/10/19 0950      PT SHORT TERM GOAL #1   Title  Patient will be independent with initial HEP to improve functional outcomes    Time  3    Period  Weeks    Status  New    Target Date  08/02/19      PT SHORT TERM GOAL #2   Title  Patient will be able to perform stand x 5 in < 40 seconds to demonstrate improvement in functional mobility and reduced risk for falls.    Time  3    Period  Weeks    Status  New    Target Date  08/02/19        PT Long Term Goals - 07/10/19 0951      PT LONG TERM GOAL #1   Title  Patient will be able to perform stand x 5 in < 25 seconds to demonstrate  improvement in functional mobility and reduced risk for falls.    Time  6    Period  Weeks    Status  New    Target Date  08/23/19      PT LONG TERM GOAL #2   Title  Patient will be able to perform TUG in <15 seconds, using least restrictive assistive device, to demonstrate improvement in functional mobility and reduced risk for falls.    Time  6    Period  Weeks    Status  New    Target Date  08/23/19      PT LONG TERM GOAL #3   Title  Patient will have equal to or > 4/5 MMT throughout BLE to improve ability to perform functional mobility, stair ambulation and ADLs.    Time  6    Period  Weeks  Status  New    Target Date  08/23/19             Plan - 07/10/19 0942    Clinical Impression Statement  Patient is a 86 y.o. male who presents to physical therapy with complaint of generalized weakness and balance difficulty. Patient demonstrates decreased strength, reduced safety awareness, balance deficits and gait abnormalities which are negatively impacting patient ability to perform ADLs and functional mobility tasks. Patient will benefit from skilled physical therapy services to address these deficits to improve level of function with ADLs, functional mobility tasks, and reduce risk for falls.    Personal Factors and Comorbidities  Age;Comorbidity 2    Comorbidities  HX ov CVA, lumbar fracture    Examination-Activity Limitations  Bathing;Bed Mobility;Bend;Squat;Stairs;Carry;Stand;Toileting;Dressing;Transfers;Hygiene/Grooming;Lift;Locomotion Level    Examination-Participation Restrictions  Community Activity;Other;Cleaning    Stability/Clinical Decision Making  Stable/Uncomplicated    Clinical Decision Making  Low    Rehab Potential  Fair    PT Frequency  2x / week    PT Duration  6 weeks    PT Treatment/Interventions  ADLs/Self Care Home Management;Aquatic Therapy;Biofeedback;Cryotherapy;Electrical Stimulation;Iontophoresis 4mg /ml Dexamethasone;Moist Heat;Traction;Balance  training;Manual techniques;Therapeutic exercise;Vasopneumatic Device;Wheelchair mobility training;Therapeutic activities;Splinting;Taping;Functional mobility training;Stair training;Orthotic Fit/Training;Gait training;DME Instruction;Patient/family education;Passive range of motion;Dry needling;Energy conservation;Other (comment);Joint Manipulations;Spinal Manipulations;Compression bandaging;Neuromuscular re-education;Cognitive remediation;Ultrasound;Parrafin;Fluidtherapy;Contrast Bath    PT Next Visit Plan  Review goals and HEP. Progress to standing static balance, strengthening and gait as tolerated.    PT Home Exercise Plan  07/10/19: seated march, LAQs, seated heel/toe raise    Recommended Other Services  OT referral for RT hand    Consulted and Agree with Plan of Care  Patient;Family member/caregiver    Family Member Consulted  Daughter       Patient will benefit from skilled therapeutic intervention in order to improve the following deficits and impairments:  Abnormal gait, Decreased endurance, Hypomobility, Impaired sensation, Decreased activity tolerance, Decreased strength, Impaired UE functional use, Decreased balance, Decreased mobility, Difficulty walking, Decreased coordination, Decreased safety awareness, Postural dysfunction  Visit Diagnosis: Muscle weakness (generalized)  Other abnormalities of gait and mobility     Problem List Patient Active Problem List   Diagnosis Date Noted  . Sundowning 07/04/2019  . Bilateral hearing loss 04/16/2019  . Urinary frequency 03/21/2019  . Chronic right shoulder pain 12/19/2018  . Weakness generalized 09/28/2018  . Palliative care encounter 09/28/2018  . Pressure injury of skin 08/27/2018  . COPD (chronic obstructive pulmonary disease) ([place]) 08/26/2018  . AMS (altered mental status) 08/22/2018  . Altered mental status 08/21/2018  . UTI (urinary tract infection) 08/21/2018  . Abnormality of gait 08/20/2018  . Prolonged Q-T interval on  ECG   . Dysphagia, post-stroke   . Supplemental oxygen dependent   . Anxiety state   . Fatigue   . SOB (shortness of breath)   . Hyperglycemia   . History of lung cancer   . Depression   . CKD (chronic kidney disease), stage III   . Acute blood loss anemia   . Fall 07/16/2018  . 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 Acute spontaneous 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 ([place]) L thalamic 07/14/2018  . Cellulitis of arm, right 06/01/2018  . Rash and nonspecific skin eruption 06/01/2018  . Chest pain 02/02/2018  . Thrombocytopenia ([place]) 02/02/2018  . Hypokalemia 02/02/2018  . Pulmonary embolism ([place]) 02/02/2018  . Elevated troponin 02/02/2018  . Pulmonary embolus ([place]) 02/02/2018  . Non-ST elevation MI (NSTEMI) ([place]) 06/27/2017  .  Diastolic CHF ([place]) 70/76/1518  . Chest pain due to CAD ([place]) 06/26/2017  . Dyspnea 06/21/2017  . Acute bronchitis 05/15/2017  . Oral candidiasis 05/15/2017  . Acute pericarditis   . Chest pain at rest 12/20/2016  . Angina pectoris ([place]) 12/20/2016  . Radiation pneumonitis ([place]) 11/09/2016  . Pleurisy 10/11/2016  . Chest wall pain 10/11/2016  . Atrial fibrillation ([place]) 05/21/2016  . Adenocarcinoma of right lung ([place]) 01/06/2016  . GAD (generalized anxiety disorder) 11/20/2015  . Essential hypertension   . Cellulitis 09/04/2015  . Cellulitis of left axilla   . Esophageal ulcer without bleeding   . Bilateral lower extremity edema 04/28/2014  . BPH (benign prostatic hyperplasia) 10/08/2012  . Anxiety   . EKG abnormalities 09/05/2012  . Tremor 09/05/2012  . Chronic fatigue 09/05/2012  . Arthritis   . Asthma, chronic   . Reflux esophagitis   . S/P CABG (coronary artery bypass graft)   . GERD (gastroesophageal reflux disease)   . Hyperlipidemia   . Hypertension   . UNSPECIFIED PERIPHERAL VASCULAR DISEASE 06/16/2009   2:20 PM, 07/10/19 Evan [name] PT DPT   Physical Therapist with [facility]  [phone] [place] [address] [place], [place], [address] Phone: [phone]   Fax:  [phone]  Name: Evan [name] MRN: [id number] Date of Birth: 29-Apr-1927

## 2019-07-15 ENCOUNTER — Ambulatory Visit (HOSPITAL_COMMUNITY): Payer: Medicare HMO | Admitting: Physical Therapy

## 2019-07-15 ENCOUNTER — Encounter (HOSPITAL_COMMUNITY): Payer: Self-pay | Admitting: Physical Therapy

## 2019-07-15 ENCOUNTER — Other Ambulatory Visit: Payer: Self-pay

## 2019-07-15 DIAGNOSIS — R29898 Other symptoms and signs involving the musculoskeletal system: Secondary | ICD-10-CM | POA: Diagnosis not present

## 2019-07-15 DIAGNOSIS — R278 Other lack of coordination: Secondary | ICD-10-CM | POA: Diagnosis not present

## 2019-07-15 DIAGNOSIS — R2689 Other abnormalities of gait and mobility: Secondary | ICD-10-CM

## 2019-07-15 DIAGNOSIS — D689 Coagulation defect, unspecified: Secondary | ICD-10-CM | POA: Diagnosis not present

## 2019-07-15 DIAGNOSIS — M6281 Muscle weakness (generalized): Secondary | ICD-10-CM | POA: Diagnosis not present

## 2019-07-15 DIAGNOSIS — I610 Nontraumatic intracerebral hemorrhage in hemisphere, subcortical: Secondary | ICD-10-CM | POA: Diagnosis not present

## 2019-07-15 DIAGNOSIS — I619 Nontraumatic intracerebral hemorrhage, unspecified: Secondary | ICD-10-CM | POA: Diagnosis not present

## 2019-07-15 NOTE — Therapy (Signed)
[place], [place], [address] Phone: [phone]   Fax:  [phone]  Physical Therapy Treatment  Patient Details  Name: Evan [name] MRN: [id number] Date of Birth: Feb 04, 1928 Referring Provider (PT): [name] MD    Encounter Date: 07/15/2019  PT End of Session - 07/15/19 0910    Visit Number  2    Number of Visits  12    Date for PT Re-Evaluation  08/23/19    Authorization Type  [employer]    Authorization Time Period  07/10/19-08/23/19    Progress Note Due on Visit  10    PT Start Time  0910   arrived late   PT Stop Time  0945    PT Time Calculation (min)  35 min    Equipment Utilized During Treatment  Gait belt    Activity Tolerance  Patient limited by fatigue    Behavior During Therapy  [facility] for tasks assessed/performed       Past Medical History:  Diagnosis Date  . Adenocarcinoma of right lung ([place]) 01/06/2016  . Anginal chest pain at rest [facility])    Chronic non, controlled on Lorazepam  . Anxiety   . Arthritis    "all over"   . BPH (benign prostatic hyperplasia)   . CAD (coronary artery disease)   . Chronic lower back pain   . Complication of anesthesia    "problems making water afterwards"  . Depression   . DJD (degenerative joint disease)   . Esophageal ulcer without bleeding    bid ppi indefinitely  . Family history of adverse reaction to anesthesia    "children w/PONV"  . GERD (gastroesophageal reflux disease)   . Headache    "couple/week maybe" (09/04/2015)  . Hemochromatosis    Possible, elevated iron stores, hook-like osteophytes on hand films, normal LFTs  . Hepatitis    "yellow jaundice as a baby"  . History of ASCVD    MULTIVESSEL  . Hyperlipidemia   . Hypertension   . Osteoarthritis   . Pneumonia    "several times; got a little now" (09/04/2015)  . Rheumatoid arthritis ([place])   . Subdural hematoma (HCC)    small after fall 09/2016-plavix held, neurosurgery consulted.   Asymptomatic.    Marland Kitchen 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[place]) 02/2018   on Lovenox lifelong since he failed PO Eliquis    Past Surgical History:  Procedure Laterality Date  . ARM SKIN LESION BIOPSY / EXCISION Left 09/02/2015  . CATARACT EXTRACTION W/ INTRAOCULAR LENS  IMPLANT, BILATERAL Bilateral   . CHOLECYSTECTOMY OPEN    . CORNEAL TRANSPLANT Bilateral    "one at [facility]; one at [facility]"  . CORONARY ANGIOPLASTY WITH STENT PLACEMENT    . CORONARY ARTERY BYPASS GRAFT  1999  . DESCENDING AORTIC ANEURYSM REPAIR W/ STENT     DE stent ostium into the right radial free graft at OM-, 02-2007  . ESOPHAGOGASTRODUODENOSCOPY N/A 11/09/2014   Procedure: ESOPHAGOGASTRODUODENOSCOPY (EGD);  Surgeon: [name], MD;  Location: [facility] ENDOSCOPY;  Service: Endoscopy;  Laterality: N/A;  . INGUINAL HERNIA REPAIR    . JOINT REPLACEMENT    . LEFT HEART CATH AND CORS/GRAFTS ANGIOGRAPHY N/A 06/27/2017   Procedure: LEFT HEART CATH AND CORS/GRAFTS ANGIOGRAPHY;  Surgeon: [name], MD;  Location: [place] CV LAB;  Service: Cardiovascular;  Laterality: N/A;  . NASAL SINUS SURGERY    . SHOULDER OPEN ROTATOR CUFF REPAIR Right   . TOTAL KNEE ARTHROPLASTY Bilateral  There were no vitals filed for this visit.  Subjective Assessment - 07/15/19 0913    Subjective  Patient reports no new issues since evaluation last visit. Reports compliance with HEP.    Patient is accompained by:  Family member   daughter   Pertinent History  CVA effecting RT side on 07/13/18, lumbar fracture December 2021    Limitations  Lifting;Standing;Walking    How long can you stand comfortably?  1-2 minutes    How long can you walk comfortably?  60 feet with RW    Patient Stated Goals  Be able to walk    Currently in Pain?  No/denies                       [facility] Adult PT Treatment/Exercise - 07/15/19 0001      Exercises   Exercises  Knee/Hip      Knee/Hip Exercises: Standing   Gait Training  15'x12' with RW and WC follow, cues for  posturing       Knee/Hip Exercises: Seated   Long Arc Quad  Both;2 sets;10 reps    Other Seated Knee/Hip Exercises  seated heel/toe raises 2 x 10    Other Seated Knee/Hip Exercises  seated perturbations with lumbar unsupported on mat for core 3x 10" in all planes    Marching  Both;2 sets;10 reps             PT Education - 07/15/19 1616    Education Details  on adding unsupported sitting to HEP for improved core control/ strengthening    Person(s) Educated  Patient;Child(ren)    Methods  Explanation    Comprehension  Verbalized understanding       PT Short Term Goals - 07/10/19 0950      PT SHORT TERM GOAL #1   Title  Patient will be independent with initial HEP to improve functional outcomes    Time  3    Period  Weeks    Status  New    Target Date  08/02/19      PT SHORT TERM GOAL #2   Title  Patient will be able to perform stand x 5 in < 40 seconds to demonstrate improvement in functional mobility and reduced risk for falls.    Time  3    Period  Weeks    Status  New    Target Date  08/02/19        PT Long Term Goals - 07/10/19 0951      PT LONG TERM GOAL #1   Title  Patient will be able to perform stand x 5 in < 25 seconds to demonstrate improvement in functional mobility and reduced risk for falls.    Time  6    Period  Weeks    Status  New    Target Date  08/23/19      PT LONG TERM GOAL #2   Title  Patient will be able to perform TUG in <15 seconds, using least restrictive assistive device, to demonstrate improvement in functional mobility and reduced risk for falls.    Time  6    Period  Weeks    Status  New    Target Date  08/23/19      PT LONG TERM GOAL #3   Title  Patient will have equal to or > 4/5 MMT throughout BLE to improve ability to perform functional mobility, stair ambulation and ADLs.    Time  6  Period  Weeks    Status  New    Target Date  08/23/19            Plan - 07/15/19 1617    Clinical Impression Statement   Initiated session with therapy goals and HEP review. Performed HEP exercise with patient, patient cued on reps for completion. Patient with noted fatigue with ther ex today and requires several breaks for rest. Patient required cueing for achieving similar level of Rt ankle DF as on LT. Added unsupported seated perturbations for improved core strength/ stabilization in all planes. Patient did well with this, but with noted fatigue. Patient able to ambulate 15 feet, then 12 feet with RW and CG assist. Patient requires cues for upright posturing and demos RT ankle inversion when fatigued. Will continue to progress as tolerated.    Personal Factors and Comorbidities  Age;Comorbidity 2    Comorbidities  HX ov CVA, lumbar fracture    Examination-Activity Limitations  Bathing;Bed Mobility;Bend;Squat;Stairs;Carry;Stand;Toileting;Dressing;Transfers;Hygiene/Grooming;Lift;Locomotion Level    Examination-Participation Restrictions  Community Activity;Other;Cleaning    Stability/Clinical Decision Making  Stable/Uncomplicated    Rehab Potential  Fair    PT Frequency  2x / week    PT Duration  6 weeks    PT Treatment/Interventions  ADLs/Self Care Home Management;Aquatic Therapy;Biofeedback;Cryotherapy;Electrical Stimulation;Iontophoresis 4mg /ml Dexamethasone;Moist Heat;Traction;Balance training;Manual techniques;Therapeutic exercise;Vasopneumatic Device;Wheelchair mobility training;Therapeutic activities;Splinting;Taping;Functional mobility training;Stair training;Orthotic Fit/Training;Gait training;DME Instruction;Patient/family education;Passive range of motion;Dry needling;Energy conservation;Other (comment);Joint Manipulations;Spinal Manipulations;Compression bandaging;Neuromuscular re-education;Cognitive remediation;Ultrasound;Parrafin;Fluidtherapy;Contrast Bath    PT Next Visit Plan  Progress to standing static balance, strengthening and gait as tolerated. Add sit to stands next visit    PT Home Exercise Plan   07/10/19: seated march, LAQs, seated heel/toe raise; 07/15/19: unsupported sitting 1-2 min rounds    Consulted and Agree with Plan of Care  Patient;Family member/caregiver    Family Member Consulted  Daughter       Patient will benefit from skilled therapeutic intervention in order to improve the following deficits and impairments:  Abnormal gait, Decreased endurance, Hypomobility, Impaired sensation, Decreased activity tolerance, Decreased strength, Impaired UE functional use, Decreased balance, Decreased mobility, Difficulty walking, Decreased coordination, Decreased safety awareness, Postural dysfunction  Visit Diagnosis: Muscle weakness (generalized)  Other abnormalities of gait and mobility     Problem List Patient Active Problem List   Diagnosis Date Noted  . Sundowning 07/04/2019  . Bilateral hearing loss 04/16/2019  . Urinary frequency 03/21/2019  . Chronic right shoulder pain 12/19/2018  . Weakness generalized 09/28/2018  . Palliative care encounter 09/28/2018  . Pressure injury of skin 08/27/2018  . COPD (chronic obstructive pulmonary disease) ([place]) 08/26/2018  . AMS (altered mental status) 08/22/2018  . Altered mental status 08/21/2018  . UTI (urinary tract infection) 08/21/2018  . Abnormality of gait 08/20/2018  . Prolonged Q-T interval on ECG   . Dysphagia, post-stroke   . Supplemental oxygen dependent   . Anxiety state   . Fatigue   . SOB (shortness of breath)   . Hyperglycemia   . History of lung cancer   . Depression   . CKD (chronic kidney disease), stage III   . Acute blood loss anemia   . Fall 07/16/2018  . 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 Acute spontaneous 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 ([place]) L thalamic 07/14/2018  . Cellulitis of arm, right 06/01/2018  . Rash and nonspecific skin eruption 06/01/2018  . Chest pain 02/02/2018  . Thrombocytopenia ([place]) 02/02/2018  .  Hypokalemia 02/02/2018  .  Pulmonary embolism ([place]) 02/02/2018  . Elevated troponin 02/02/2018  . Pulmonary embolus ([place]) 02/02/2018  . Non-ST elevation MI (NSTEMI) ([place]) 06/27/2017  . Diastolic CHF ([place]) 17/00/1749  . Chest pain due to CAD ([place]) 06/26/2017  . Dyspnea 06/21/2017  . Acute bronchitis 05/15/2017  . Oral candidiasis 05/15/2017  . Acute pericarditis   . Chest pain at rest 12/20/2016  . Angina pectoris ([place]) 12/20/2016  . Radiation pneumonitis ([place]) 11/09/2016  . Pleurisy 10/11/2016  . Chest wall pain 10/11/2016  . Atrial fibrillation ([place]) 05/21/2016  . Adenocarcinoma of right lung ([place]) 01/06/2016  . GAD (generalized anxiety disorder) 11/20/2015  . Essential hypertension   . Cellulitis 09/04/2015  . Cellulitis of left axilla   . Esophageal ulcer without bleeding   . Bilateral lower extremity edema 04/28/2014  . BPH (benign prostatic hyperplasia) 10/08/2012  . Anxiety   . EKG abnormalities 09/05/2012  . Tremor 09/05/2012  . Chronic fatigue 09/05/2012  . Arthritis   . Asthma, chronic   . Reflux esophagitis   . S/P CABG (coronary artery bypass graft)   . GERD (gastroesophageal reflux disease)   . Hyperlipidemia   . Hypertension   . UNSPECIFIED PERIPHERAL VASCULAR DISEASE 06/16/2009   4:35 PM, 07/15/19 Josue Hector PT DPT  Physical Therapist with [facility]  [phone] [place], [place], [address] Phone: [phone]   Fax:  [phone]  Name: Evan [name] MRN: [id number] Date of Birth: [birth year]

## 2019-07-16 DIAGNOSIS — I619 Nontraumatic intracerebral hemorrhage, unspecified: Secondary | ICD-10-CM | POA: Diagnosis not present

## 2019-07-16 DIAGNOSIS — D689 Coagulation defect, unspecified: Secondary | ICD-10-CM | POA: Diagnosis not present

## 2019-07-16 DIAGNOSIS — I610 Nontraumatic intracerebral hemorrhage in hemisphere, subcortical: Secondary | ICD-10-CM | POA: Diagnosis not present

## 2019-07-18 ENCOUNTER — Ambulatory Visit (HOSPITAL_COMMUNITY): Payer: Medicare HMO

## 2019-07-18 ENCOUNTER — Telehealth (HOSPITAL_COMMUNITY): Payer: Self-pay

## 2019-07-18 NOTE — Telephone Encounter (Signed)
pt's dtr called to cancel this appt due to her dad is not coming in. no reason given.. I BELIEVE HE IS NOT FEELING WELL.

## 2019-07-23 ENCOUNTER — Other Ambulatory Visit: Payer: Self-pay

## 2019-07-23 ENCOUNTER — Encounter (HOSPITAL_COMMUNITY): Payer: Self-pay | Admitting: Physical Therapy

## 2019-07-23 ENCOUNTER — Ambulatory Visit (HOSPITAL_COMMUNITY): Payer: Medicare HMO | Admitting: Physical Therapy

## 2019-07-23 DIAGNOSIS — R2689 Other abnormalities of gait and mobility: Secondary | ICD-10-CM | POA: Diagnosis not present

## 2019-07-23 DIAGNOSIS — R278 Other lack of coordination: Secondary | ICD-10-CM | POA: Diagnosis not present

## 2019-07-23 DIAGNOSIS — M6281 Muscle weakness (generalized): Secondary | ICD-10-CM

## 2019-07-23 DIAGNOSIS — R29898 Other symptoms and signs involving the musculoskeletal system: Secondary | ICD-10-CM | POA: Diagnosis not present

## 2019-07-23 NOTE — Therapy (Signed)
[place], [place], [address] Phone: [phone]   Fax:  [phone]  Physical Therapy Treatment  Patient Details  Name: Evan [name] MRN: [id number] Date of Birth: [birth year] Referring Provider (PT): [name] MD    Encounter Date: 07/23/2019  PT End of Session - 07/23/19 0950    Visit Number  3    Number of Visits  12    Date for PT Re-Evaluation  08/23/19    Authorization Type  [employer]    Authorization - Number of Visits  3    Progress Note Due on Visit  10    PT Start Time  [phone]    PT Stop Time  1030    PT Time Calculation (min)  43 min    Equipment Utilized During Treatment  Gait belt    Activity Tolerance  Patient limited by fatigue    Behavior During Therapy  [facility] for tasks assessed/performed       Past Medical History:  Diagnosis Date  . Adenocarcinoma of right lung ([place]) 01/06/2016  . Anginal chest pain at rest [facility])    Chronic non, controlled on Lorazepam  . Anxiety   . Arthritis    "all over"   . BPH (benign prostatic hyperplasia)   . CAD (coronary artery disease)   . Chronic lower back pain   . Complication of anesthesia    "problems making water afterwards"  . Depression   . DJD (degenerative joint disease)   . Esophageal ulcer without bleeding    bid ppi indefinitely  . Family history of adverse reaction to anesthesia    "children w/PONV"  . GERD (gastroesophageal reflux disease)   . Headache    "couple/week maybe" (09/04/2015)  . Hemochromatosis    Possible, elevated iron stores, hook-like osteophytes on hand films, normal LFTs  . Hepatitis    "yellow jaundice as a baby"  . History of ASCVD    MULTIVESSEL  . Hyperlipidemia   . Hypertension   . Osteoarthritis   . Pneumonia    "several times; got a little now" (09/04/2015)  . Rheumatoid arthritis ([place])   . Subdural hematoma ([facility])    small after fall 09/2016-plavix held, neurosurgery consulted.  Asymptomatic.    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 ARM SKIN LESION BIOPSY / EXCISION Left 09/02/2015  . CATARACT EXTRACTION W/ INTRAOCULAR LENS  IMPLANT, BILATERAL Bilateral   . CHOLECYSTECTOMY OPEN    . CORNEAL TRANSPLANT Bilateral    "one at [facility]; one at [facility]"  . CORONARY ANGIOPLASTY WITH STENT PLACEMENT    . CORONARY ARTERY BYPASS GRAFT  1999  . DESCENDING AORTIC ANEURYSM REPAIR W/ STENT     DE stent ostium into the right radial free graft at OM-, 02-2007  . ESOPHAGOGASTRODUODENOSCOPY N/A 11/09/2014   Procedure: ESOPHAGOGASTRODUODENOSCOPY (EGD);  Surgeon: [name], MD;  Location: [facility] ENDOSCOPY;  Service: Endoscopy;  Laterality: N/A;  . INGUINAL HERNIA REPAIR    . JOINT REPLACEMENT    . LEFT HEART CATH AND CORS/GRAFTS ANGIOGRAPHY N/A 06/27/2017   Procedure: LEFT HEART CATH AND CORS/GRAFTS ANGIOGRAPHY;  Surgeon: [name], MD;  Location: [place] CV LAB;  Service: Cardiovascular;  Laterality: N/A;  . NASAL SINUS SURGERY    . SHOULDER OPEN ROTATOR CUFF REPAIR Right   . TOTAL KNEE ARTHROPLASTY Bilateral  There were no vitals filed for this visit.  Subjective Assessment - 07/23/19 0950    Subjective  Patient presents with daughter. Report no new complaints or issues since last visit. Say he wasnt feeling well so they cancelled last visit, doing better now. Reports compliance with HEP, some trouble with heel/toe raises.    Patient is accompained by:  Family member   daughter   Pertinent History  CVA effecting RT side on 07/13/18, lumbar fracture December 2021    Limitations  Lifting;Standing;Walking    How long can you stand comfortably?  1-2 minutes    How long can you walk comfortably?  60 feet with RW    Patient Stated Goals  Be able to walk    Currently in Pain?  No/denies                       [facility] Adult PT Treatment/Exercise - 07/23/19 0001      Knee/Hip Exercises:  Standing   Gait Training  30' with RW and WC follow, cues for posturing     Other Standing Knee Exercises  staggered stance, solid floor, 2 x 10" each with CG        Knee/Hip Exercises: Seated   Long Arc Quad  Both;2 sets;10 reps    Other Seated Knee/Hip Exercises  seated heel/toe raises 2 x 10    Marching  Both;2 sets;10 reps    Sit to Sand  1 set;5 reps;without UE support             PT Education - 07/23/19 1220    Education Details  on grounding through UE and LE to decrease shaking and complaint of soreness when occurring    Person(s) Educated  Patient;Child(ren)    Methods  Explanation    Comprehension  Verbalized understanding       PT Short Term Goals - 07/10/19 0950      PT SHORT TERM GOAL #1   Title  Patient will be independent with initial HEP to improve functional outcomes    Time  3    Period  Weeks    Status  New    Target Date  08/02/19      PT SHORT TERM GOAL #2   Title  Patient will be able to perform stand x 5 in < 40 seconds to demonstrate improvement in functional mobility and reduced risk for falls.    Time  3    Period  Weeks    Status  New    Target Date  08/02/19        PT Long Term Goals - 07/10/19 0951      PT LONG TERM GOAL #1   Title  Patient will be able to perform stand x 5 in < 25 seconds to demonstrate improvement in functional mobility and reduced risk for falls.    Time  6    Period  Weeks    Status  New    Target Date  08/23/19      PT LONG TERM GOAL #2   Title  Patient will be able to perform TUG in <15 seconds, using least restrictive assistive device, to demonstrate improvement in functional mobility and reduced risk for falls.    Time  6    Period  Weeks    Status  New    Target Date  08/23/19      PT LONG TERM GOAL #3   Title  Patient will have  equal to or > 4/5 MMT throughout BLE to improve ability to perform functional mobility, stair ambulation and ADLs.    Time  6    Period  Weeks    Status  New    Target  Date  08/23/19            Plan - 07/23/19 1221    Clinical Impression Statement  Patient able to progress to static standing balance, though appears very nervous. Patient able to maintain about 5-10 second rounds before having notable tremor which his daughter described happens to him when he gets nervous or anxious. Patient was able to progress gait distance to 30 feet, and shows good awareness of foot placement, as he tends to lack RLE ground clearance when fatigued but is typically able to correct with subsequent step. Patient noted some soreness in his RT arm during session, which may be associated with his muscle spasms/ tremors. Discussed with patient and his daughter to try grounded extremity when this happens to see if issue resolves.    Personal Factors and Comorbidities  Age;Comorbidity 2    Comorbidities  HX ov CVA, lumbar fracture    Examination-Activity Limitations  Bathing;Bed Mobility;Bend;Squat;Stairs;Carry;Stand;Toileting;Dressing;Transfers;Hygiene/Grooming;Lift;Locomotion Level    Examination-Participation Restrictions  Community Activity;Other;Cleaning    Stability/Clinical Decision Making  Stable/Uncomplicated    Rehab Potential  Fair    PT Frequency  2x / week    PT Duration  6 weeks    PT Treatment/Interventions  ADLs/Self Care Home Management;Aquatic Therapy;Biofeedback;Cryotherapy;Electrical Stimulation;Iontophoresis 4mg /ml Dexamethasone;Moist Heat;Traction;Balance training;Manual techniques;Therapeutic exercise;Vasopneumatic Device;Wheelchair mobility training;Therapeutic activities;Splinting;Taping;Functional mobility training;Stair training;Orthotic Fit/Training;Gait training;DME Instruction;Patient/family education;Passive range of motion;Dry needling;Energy conservation;Other (comment);Joint Manipulations;Spinal Manipulations;Compression bandaging;Neuromuscular re-education;Cognitive remediation;Ultrasound;Parrafin;Fluidtherapy;Contrast Bath    PT Next Visit Plan   continue with static balance and gait progression as tolerated. attempt small step up when able    PT Home Exercise Plan  07/10/19: seated march, LAQs, seated heel/toe raise; 07/15/19: unsupported sitting 1-2 min rounds    Consulted and Agree with Plan of Care  Patient;Family member/caregiver    Family Member Consulted  Daughter       Patient will benefit from skilled therapeutic intervention in order to improve the following deficits and impairments:  Abnormal gait, Decreased endurance, Hypomobility, Impaired sensation, Decreased activity tolerance, Decreased strength, Impaired UE functional use, Decreased balance, Decreased mobility, Difficulty walking, Decreased coordination, Decreased safety awareness, Postural dysfunction  Visit Diagnosis: Muscle weakness (generalized)  Other abnormalities of gait and mobility     Problem List Patient Active Problem List   Diagnosis Date Noted  . Sundowning 07/04/2019  . Bilateral hearing loss 04/16/2019  . Urinary frequency 03/21/2019  . Chronic right shoulder pain 12/19/2018  . Weakness generalized 09/28/2018  . Palliative care encounter 09/28/2018  . Pressure injury of skin 08/27/2018  . COPD (chronic obstructive pulmonary disease) ([place]) 08/26/2018  . AMS (altered mental status) 08/22/2018  . Altered mental status 08/21/2018  . UTI (urinary tract infection) 08/21/2018  . Abnormality of gait 08/20/2018  . Prolonged Q-T interval on ECG   . Dysphagia, post-stroke   . Supplemental oxygen dependent   . Anxiety state   . Fatigue   . SOB (shortness of breath)   . Hyperglycemia   . History of lung cancer   . Depression   . CKD (chronic kidney disease), stage III   . Acute blood loss anemia   . Fall 07/16/2018  . 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 Acute spontaneous 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 (  [place]) L thalamic 07/14/2018  . Cellulitis of arm, right  06/01/2018  . Rash and nonspecific skin eruption 06/01/2018  . Chest pain 02/02/2018  . Thrombocytopenia ([place]) 02/02/2018  . Hypokalemia 02/02/2018  . Pulmonary embolism ([place]) 02/02/2018  . Elevated troponin 02/02/2018  . Pulmonary embolus ([place]) 02/02/2018  . Non-ST elevation MI (NSTEMI) ([place]) 06/27/2017  . Diastolic CHF ([place]) 46/27/0350  . Chest pain due to CAD ([place]) 06/26/2017  . Dyspnea 06/21/2017  . Acute bronchitis 05/15/2017  . Oral candidiasis 05/15/2017  . Acute pericarditis   . Chest pain at rest 12/20/2016  . Angina pectoris ([place]) 12/20/2016  . Radiation pneumonitis ([place]) 11/09/2016  . Pleurisy 10/11/2016  . Chest wall pain 10/11/2016  . Atrial fibrillation ([place]) 05/21/2016  . Adenocarcinoma of right lung ([place]) 01/06/2016  . GAD (generalized anxiety disorder) 11/20/2015  . Essential hypertension   . Cellulitis 09/04/2015  . Cellulitis of left axilla   . Esophageal ulcer without bleeding   . Bilateral lower extremity edema 04/28/2014  . BPH (benign prostatic hyperplasia) 10/08/2012  . Anxiety   . EKG abnormalities 09/05/2012  . Tremor 09/05/2012  . Chronic fatigue 09/05/2012  . Arthritis   . Asthma, chronic   . Reflux esophagitis   . S/P CABG (coronary artery bypass graft)   . GERD (gastroesophageal reflux disease)   . Hyperlipidemia   . Hypertension   . UNSPECIFIED PERIPHERAL VASCULAR DISEASE 06/16/2009   12:28 PM, 07/23/19 Josue Hector PT DPT  Physical Therapist with [facility]  [phone] [place] [address] [place], [place], [address] Phone: [phone]   Fax:  [phone]  Name: Evan [name] MRN: [id number] Date of Birth: 1927/09/06

## 2019-07-25 ENCOUNTER — Ambulatory Visit (HOSPITAL_COMMUNITY): Payer: Medicare HMO | Admitting: Physical Therapy

## 2019-07-25 ENCOUNTER — Encounter (HOSPITAL_COMMUNITY): Payer: Self-pay | Admitting: Physical Therapy

## 2019-07-25 ENCOUNTER — Other Ambulatory Visit: Payer: Self-pay

## 2019-07-25 DIAGNOSIS — M6281 Muscle weakness (generalized): Secondary | ICD-10-CM

## 2019-07-25 DIAGNOSIS — R2689 Other abnormalities of gait and mobility: Secondary | ICD-10-CM | POA: Diagnosis not present

## 2019-07-25 DIAGNOSIS — R278 Other lack of coordination: Secondary | ICD-10-CM | POA: Diagnosis not present

## 2019-07-25 DIAGNOSIS — R29898 Other symptoms and signs involving the musculoskeletal system: Secondary | ICD-10-CM | POA: Diagnosis not present

## 2019-07-25 DIAGNOSIS — S32010A Wedge compression fracture of first lumbar vertebra, initial encounter for closed fracture: Secondary | ICD-10-CM | POA: Diagnosis not present

## 2019-07-25 DIAGNOSIS — Z85118 Personal history of other malignant neoplasm of bronchus and lung: Secondary | ICD-10-CM | POA: Diagnosis not present

## 2019-07-25 DIAGNOSIS — J449 Chronic obstructive pulmonary disease, unspecified: Secondary | ICD-10-CM | POA: Diagnosis not present

## 2019-07-25 NOTE — Therapy (Signed)
[place], [place], [address] Phone: [phone]   Fax:  [phone]  Physical Therapy Treatment  Patient Details  Name: Evan [name] MRN: [id number] Date of Birth: 08/08/1927 Referring Provider (PT): [name] MD    Encounter Date: 07/25/2019  PT End of Session - 07/25/19 0906    Visit Number  4    Number of Visits  12    Date for PT Re-Evaluation  08/23/19    Authorization Type  [employer]    Authorization - Number of Visits  4    Progress Note Due on Visit  10    PT Start Time  0900    PT Stop Time  0940    PT Time Calculation (min)  40 min    Equipment Utilized During Treatment  Gait belt    Activity Tolerance  Patient limited by fatigue    Behavior During Therapy  [facility] for tasks assessed/performed       Past Medical History:  Diagnosis Date  . Adenocarcinoma of right lung ([place]) 01/06/2016  . Anginal chest pain at rest [facility]    Chronic non, controlled on Lorazepam  . Anxiety   . Arthritis    "all over"   . BPH (benign prostatic hyperplasia)   . CAD (coronary artery disease)   . Chronic lower back pain   . Complication of anesthesia    "problems making water afterwards"  . Depression   . DJD (degenerative joint disease)   . Esophageal ulcer without bleeding    bid ppi indefinitely  . Family history of adverse reaction to anesthesia    "children w/PONV"  . GERD (gastroesophageal reflux disease)   . Headache    "couple/week maybe" (09/04/2015)  . Hemochromatosis    Possible, elevated iron stores, hook-like osteophytes on hand films, normal LFTs  . Hepatitis    "yellow jaundice as a baby"  . History of ASCVD    MULTIVESSEL  . Hyperlipidemia   . Hypertension   . Osteoarthritis   . Pneumonia    "several times; got a little now" (09/04/2015)  . Rheumatoid arthritis ([place])   . Subdural hematoma ([facility])    small after fall 09/2016-plavix held, neurosurgery consulted.  Asymptomatic.    Marland Kitchen  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[place]) 02/2018   on Lovenox lifelong since he failed PO Eliquis    Past Surgical History:  Procedure Laterality Date  . ARM SKIN LESION BIOPSY / EXCISION Left 09/02/2015  . CATARACT EXTRACTION W/ INTRAOCULAR LENS  IMPLANT, BILATERAL Bilateral   . CHOLECYSTECTOMY OPEN    . CORNEAL TRANSPLANT Bilateral    "one at [facility]; one at [facility]"  . CORONARY ANGIOPLASTY WITH STENT PLACEMENT    . CORONARY ARTERY BYPASS GRAFT  1999  . DESCENDING AORTIC ANEURYSM REPAIR W/ STENT     DE stent ostium into the right radial free graft at OM-, 02-2007  . ESOPHAGOGASTRODUODENOSCOPY N/A 11/09/2014   Procedure: ESOPHAGOGASTRODUODENOSCOPY (EGD);  Surgeon: [name], MD;  Location: [facility] ENDOSCOPY;  Service: Endoscopy;  Laterality: N/A;  . INGUINAL HERNIA REPAIR    . JOINT REPLACEMENT    . LEFT HEART CATH AND CORS/GRAFTS ANGIOGRAPHY N/A 06/27/2017   Procedure: LEFT HEART CATH AND CORS/GRAFTS ANGIOGRAPHY;  Surgeon: [name], MD;  Location: [place] CV LAB;  Service: Cardiovascular;  Laterality: N/A;  . NASAL SINUS SURGERY    . SHOULDER OPEN ROTATOR CUFF REPAIR Right   . TOTAL KNEE ARTHROPLASTY Bilateral  There were no vitals filed for this visit.  Subjective Assessment - 07/25/19 0904    Subjective  Patient presents with daughter today. Says his arm is still bothering him. Says he had some teeth pulled earlier this week so he hasnt done much since. Havent really tried grounding technique for arm discomfort. Having back xray later today.    Patient is accompained by:  Family member   daughter   Pertinent History  CVA effecting RT side on 07/13/18, lumbar fracture December 2021    Limitations  Lifting;Standing;Walking    How long can you stand comfortably?  1-2 minutes    How long can you walk comfortably?  60 feet with RW    Patient Stated Goals  Be able to walk    Currently in Pain?  No/denies                       [facility] Adult PT Treatment/Exercise - 07/25/19 0001       Knee/Hip Exercises: Standing   Gait Training  30' x 25' with RW and WC follow, cues for posturing     Other Standing Knee Exercises  staggered stance, solid floor, 2 x 10" each with CG        Knee/Hip Exercises: Seated   Long Arc Quad  Both;2 sets;10 reps    Other Seated Knee/Hip Exercises  seated heel/toe raises 2 x 10    Marching  Both;2 sets;10 reps    Sit to Sand  1 set;5 reps;without UE support               PT Short Term Goals - 07/10/19 0950      PT SHORT TERM GOAL #1   Title  Patient will be independent with initial HEP to improve functional outcomes    Time  3    Period  Weeks    Status  New    Target Date  08/02/19      PT SHORT TERM GOAL #2   Title  Patient will be able to perform stand x 5 in < 40 seconds to demonstrate improvement in functional mobility and reduced risk for falls.    Time  3    Period  Weeks    Status  New    Target Date  08/02/19        PT Long Term Goals - 07/10/19 0951      PT LONG TERM GOAL #1   Title  Patient will be able to perform stand x 5 in < 25 seconds to demonstrate improvement in functional mobility and reduced risk for falls.    Time  6    Period  Weeks    Status  New    Target Date  08/23/19      PT LONG TERM GOAL #2   Title  Patient will be able to perform TUG in <15 seconds, using least restrictive assistive device, to demonstrate improvement in functional mobility and reduced risk for falls.    Time  6    Period  Weeks    Status  New    Target Date  08/23/19      PT LONG TERM GOAL #3   Title  Patient will have equal to or > 4/5 MMT throughout BLE to improve ability to perform functional mobility, stair ambulation and ADLs.    Time  6    Period  Weeks    Status  New    Target Date  08/23/19            Plan - 07/25/19 0944    Clinical Impression Statement  Patient continues to be limited largely by fatigue. Patient was able to perform increased ambulation distance with rest breaks today. Patient  required verbal cueing for planting RLE soundly on floor before initiating sit to stand, and needs tactile cues/ assist for RT hand placement on mat for push off. Patient showing somewhat more tremulous activity with RUE today. Ambulation performed prior to seated leg exercise this session. Patient with noted fatigue, but able to perform all reps with rest breaks. Patient with continued RT ankle inversion compensation with attempts at heel raise/ RT ankle PF.    Personal Factors and Comorbidities  Age;Comorbidity 2    Comorbidities  HX ov CVA, lumbar fracture    Examination-Activity Limitations  Bathing;Bed Mobility;Bend;Squat;Stairs;Carry;Stand;Toileting;Dressing;Transfers;Hygiene/Grooming;Lift;Locomotion Level    Examination-Participation Restrictions  Community Activity;Other;Cleaning    Stability/Clinical Decision Making  Stable/Uncomplicated    Rehab Potential  Fair    PT Frequency  2x / week    PT Duration  6 weeks    PT Treatment/Interventions  ADLs/Self Care Home Management;Aquatic Therapy;Biofeedback;Cryotherapy;Electrical Stimulation;Iontophoresis 4mg /ml Dexamethasone;Moist Heat;Traction;Balance training;Manual techniques;Therapeutic exercise;Vasopneumatic Device;Wheelchair mobility training;Therapeutic activities;Splinting;Taping;Functional mobility training;Stair training;Orthotic Fit/Training;Gait training;DME Instruction;Patient/family education;Passive range of motion;Dry needling;Energy conservation;Other (comment);Joint Manipulations;Spinal Manipulations;Compression bandaging;Neuromuscular re-education;Cognitive remediation;Ultrasound;Parrafin;Fluidtherapy;Contrast Bath    PT Next Visit Plan  continue with static balance and gait progression as tolerated. attempt small step up when able    PT Home Exercise Plan  07/10/19: seated march, LAQs, seated heel/toe raise; 07/15/19: unsupported sitting 1-2 min rounds    Consulted and Agree with Plan of Care  Patient;Family member/caregiver     Family Member Consulted  Daughter       Patient will benefit from skilled therapeutic intervention in order to improve the following deficits and impairments:  Abnormal gait, Decreased endurance, Hypomobility, Impaired sensation, Decreased activity tolerance, Decreased strength, Impaired UE functional use, Decreased balance, Decreased mobility, Difficulty walking, Decreased coordination, Decreased safety awareness, Postural dysfunction  Visit Diagnosis: Muscle weakness (generalized)  Other abnormalities of gait and mobility     Problem List Patient Active Problem List   Diagnosis Date Noted  . Sundowning 07/04/2019  . Bilateral hearing loss 04/16/2019  . Urinary frequency 03/21/2019  . Chronic right shoulder pain 12/19/2018  . Weakness generalized 09/28/2018  . Palliative care encounter 09/28/2018  . Pressure injury of skin 08/27/2018  . COPD (chronic obstructive pulmonary disease) ([place]) 08/26/2018  . AMS (altered mental status) 08/22/2018  . Altered mental status 08/21/2018  . UTI (urinary tract infection) 08/21/2018  . Abnormality of gait 08/20/2018  . Prolonged Q-T interval on ECG   . Dysphagia, post-stroke   . Supplemental oxygen dependent   . Anxiety state   . Fatigue   . SOB (shortness of breath)   . Hyperglycemia   . History of lung cancer   . Depression   . CKD (chronic kidney disease), stage III   . Acute blood loss anemia   . Fall 07/16/2018  . 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 Acute spontaneous 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 ([place]) L thalamic 07/14/2018  . Cellulitis of arm, right 06/01/2018  . Rash and nonspecific skin eruption 06/01/2018  . Chest pain 02/02/2018  . Thrombocytopenia ([place]) 02/02/2018  . Hypokalemia 02/02/2018  . Pulmonary embolism ([place]) 02/02/2018  . Elevated troponin 02/02/2018  . Pulmonary embolus ([place]) 02/02/2018  . Non-ST elevation MI (NSTEMI) ([place])  06/27/2017  . Diastolic CHF ([place]) 00/71/2197  . Chest pain due to CAD ([place]) 06/26/2017  . Dyspnea 06/21/2017  . Acute bronchitis 05/15/2017  . Oral candidiasis 05/15/2017  . Acute pericarditis   . Chest pain at rest 12/20/2016  . Angina pectoris ([place]) 12/20/2016  . Radiation pneumonitis ([place]) 11/09/2016  . Pleurisy 10/11/2016  . Chest wall pain 10/11/2016  . Atrial fibrillation ([place]) 05/21/2016  . Adenocarcinoma of right lung ([place]) 01/06/2016  . GAD (generalized anxiety disorder) 11/20/2015  . Essential hypertension   . Cellulitis 09/04/2015  . Cellulitis of left axilla   . Esophageal ulcer without bleeding   . Bilateral lower extremity edema 04/28/2014  . BPH (benign prostatic hyperplasia) 10/08/2012  . Anxiety   . EKG abnormalities 09/05/2012  . Tremor 09/05/2012  . Chronic fatigue 09/05/2012  . Arthritis   . Asthma, chronic   . Reflux esophagitis   . S/P CABG (coronary artery bypass graft)   . GERD (gastroesophageal reflux disease)   . Hyperlipidemia   . Hypertension   . UNSPECIFIED PERIPHERAL VASCULAR DISEASE 06/16/2009    9:45 AM, 07/25/19 Josue Hector PT DPT  Physical Therapist with [facility]  [phone] [place], [place], [address] Phone: [phone]   Fax:  [phone]  Name: CHRISTOP HIPPERT MRN: [id number] Date of Birth: [birth year]

## 2019-07-30 ENCOUNTER — Encounter (HOSPITAL_COMMUNITY): Payer: Self-pay

## 2019-07-30 ENCOUNTER — Ambulatory Visit (HOSPITAL_COMMUNITY): Payer: Medicare HMO

## 2019-07-30 ENCOUNTER — Other Ambulatory Visit: Payer: Self-pay

## 2019-07-30 DIAGNOSIS — R278 Other lack of coordination: Secondary | ICD-10-CM | POA: Diagnosis not present

## 2019-07-30 DIAGNOSIS — M6281 Muscle weakness (generalized): Secondary | ICD-10-CM | POA: Diagnosis not present

## 2019-07-30 DIAGNOSIS — R2689 Other abnormalities of gait and mobility: Secondary | ICD-10-CM | POA: Diagnosis not present

## 2019-07-30 DIAGNOSIS — R29898 Other symptoms and signs involving the musculoskeletal system: Secondary | ICD-10-CM

## 2019-07-30 NOTE — Therapy (Signed)
[place], [place], [address] Phone: [phone]   Fax:  [phone]  Occupational Therapy Evaluation  Patient Details  Name: Evan [name] MRN: [id number] Date of Birth: 1927-06-24 Referring Provider (OT): [name]   Encounter Date: 07/30/2019  OT End of Session - 07/30/19 1238    Visit Number  1    Number of Visits  12    Date for OT Re-Evaluation  09/10/19    Authorization Type  [employer]    Authorization Time Period  Authorization requested for 12 visits. $40 copay    OT Start Time  0815    OT Stop Time  0900    OT Time Calculation (min)  45 min    Activity Tolerance  Patient tolerated treatment well    Behavior During Therapy  [facility] for tasks assessed/performed       Past Medical History:  Diagnosis Date  . Adenocarcinoma of right lung ([place]) 01/06/2016  . Anginal chest pain at rest [facility])    Chronic non, controlled on Lorazepam  . Anxiety   . Arthritis    "all over"   . BPH (benign prostatic hyperplasia)   . CAD (coronary artery disease)   . Chronic lower back pain   . Complication of anesthesia    "problems making water afterwards"  . Depression   . DJD (degenerative joint disease)   . Esophageal ulcer without bleeding    bid ppi indefinitely  . Family history of adverse reaction to anesthesia    "children w/PONV"  . GERD (gastroesophageal reflux disease)   . Headache    "couple/week maybe" (09/04/2015)  . Hemochromatosis    Possible, elevated iron stores, hook-like osteophytes on hand films, normal LFTs  . Hepatitis    "yellow jaundice as a baby"  . History of ASCVD    MULTIVESSEL  . Hyperlipidemia   . Hypertension   . Osteoarthritis   . Pneumonia    "several times; got a little now" (09/04/2015)  . Rheumatoid arthritis ([place])   . Subdural hematoma ([facility])    small after fall 09/2016-plavix held, neurosurgery consulted.  Asymptomatic.    Marland Kitchen 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[place]) 02/2018   on  Lovenox lifelong since he failed PO Eliquis    Past Surgical History:  Procedure Laterality Date  . ARM SKIN LESION BIOPSY / EXCISION Left 09/02/2015  . CATARACT EXTRACTION W/ INTRAOCULAR LENS  IMPLANT, BILATERAL Bilateral   . CHOLECYSTECTOMY OPEN    . CORNEAL TRANSPLANT Bilateral    "one at [facility]; one at [facility]"  . CORONARY ANGIOPLASTY WITH STENT PLACEMENT    . CORONARY ARTERY BYPASS GRAFT  1999  . DESCENDING AORTIC ANEURYSM REPAIR W/ STENT     DE stent ostium into the right radial free graft at OM-, 02-2007  . ESOPHAGOGASTRODUODENOSCOPY N/A 11/09/2014   Procedure: ESOPHAGOGASTRODUODENOSCOPY (EGD);  Surgeon: [name], MD;  Location: [facility] ENDOSCOPY;  Service: Endoscopy;  Laterality: N/A;  . INGUINAL HERNIA REPAIR    . JOINT REPLACEMENT    . LEFT HEART CATH AND CORS/GRAFTS ANGIOGRAPHY N/A 06/27/2017   Procedure: LEFT HEART CATH AND CORS/GRAFTS ANGIOGRAPHY;  Surgeon: [name], MD;  Location: [facility];  Service: Cardiovascular;  Laterality: N/A;  . NASAL SINUS SURGERY    . SHOULDER OPEN ROTATOR CUFF REPAIR Right   . TOTAL KNEE ARTHROPLASTY Bilateral     There were no vitals filed for this visit.  Subjective Assessment - 07/30/19 3500  Subjective   S: I want to be able to use my hand.    Patient is accompanied by:  Family member   daughter: [name]   Pertinent History  Patient is a86 y/o male S/P left CVA sustained 07/13/18. He received home health therapy services (OT, PT,SLP) and would like to continue with outpatient services due to his right hand weakness/uncoordination. [name] has referred patient to occupational therapy for evaluation and treatment.    Patient Stated Goals  To use his right hand better.    Currently in Pain?  Yes    Pain Score  8     Pain Location  Hip    Pain Orientation  Right    Pain Descriptors / Indicators  Aching    Pain Type  Acute pain    Pain Radiating Towards  hip to knee    Pain Onset  1 to 4 weeks ago    Pain Frequency   Constant    Aggravating Factors   movement    Pain Relieving Factors  rest    Effect of Pain on Daily Activities  min effect    Multiple Pain Sites  No        [facility] OT Assessment - 07/30/19 0828      Assessment   Medical Diagnosis  right hand weakness    Referring Provider (OT)  [name]    Onset Date/Surgical Date  07/13/18    Hand Dominance  Right    Next MD Visit  none scheduled    Prior Therapy  yes. Home health OT, PT, SLP after CVA      Precautions   Precautions  Fall      Restrictions   Weight Bearing Restrictions  No      Balance Screen   Has the patient fallen in the past 6 months  Yes    How many times?  1      Home  Environment   Family/patient expects to be discharged to:  Private residence    Living Arrangements  Children      Prior Function   Level of Independence  Needs assistance with ADLs    Vocation  Retired      ADL   ADL comments  Difficulty mainly with motor coordination and functional use of right hand/UE. Pt is unable to use it for any functional task such as self-feeding. Difficulty with dressing tasks especially with manipulating items; large and small.       Mobility   Mobility Status  History of falls      Written Expression   Dominant Hand  Right      Vision - History   Baseline Vision  Wears glasses all the time      Cognition   Overall Cognitive Status  Within Functional Limits for tasks assessed      Observation/Other Assessments   Observations  Patient is hard of hearing    Focus on Therapeutic Outcomes (FOTO)   N/A      Sensation   Light Touch  Impaired by gross assessment   pt reports decreased sensation in finger tips   Stereognosis  Not tested    Hot/Cold  Not tested    Proprioception  Not tested      Coordination   Gross Motor Movements are Fluid and Coordinated  No    Fine Motor Movements are Fluid and Coordinated  No    9 Hole Peg Test  Right  Right 9 Hole Peg Test  placed two pegs with set-up  untimed.     Box and Blocks  Left: 36 Right: 7    Coordination  decreased alternating hand motor movements. decreased finger tap to thumb on both hands.       ROM / Strength   AROM / PROM / Strength  Strength      Strength   Overall Strength Comments  Assessed seated. IR/er adducted    Strength Assessment Site  Hand;Shoulder    Right/Left Shoulder  Right;Left    Right Shoulder Flexion  4+/5    Right Shoulder ABduction  4+/5    Right Shoulder Internal Rotation  4+/5    Right Shoulder External Rotation  4+/5    Left Shoulder Flexion  4+/5    Left Shoulder ABduction  4+/5    Left Shoulder Internal Rotation  4+/5    Left Shoulder External Rotation  4+/5    Right/Left hand  Right;Left    Right Hand Grip (lbs)  25    Right Hand Lateral Pinch  8 lbs    Right Hand 3 Point Pinch  7 lbs    Left Hand Grip (lbs)  45    Left Hand Lateral Pinch  6 lbs    Left Hand 3 Point Pinch  8 lbs                      OT Education - 07/30/19 1236    Education Details  grip and pinch strengthening using chili pepper stress ball. (Daughter reports that putty in the past was messy)    Person(s) Educated  Patient;Child(ren)    Methods  Explanation;Demonstration;Verbal cues;Handout    Comprehension  Returned demonstration;Verbalized understanding       OT Short Term Goals - 07/30/19 1252      OT SHORT TERM GOAL #1   Title  Patient and family will be educated and verbalize independence with HEP in order to increase functional use of his right hand while using for 25% or more of daily tasks.    Time  6    Period  Weeks    Status  New    Target Date  09/10/19      OT SHORT TERM GOAL #2   Title  Patient will increase scapular and proximal shoulder strengthening while demonstrating increased shoulder endurance during a sustained functional reaching task for more than 1 minute.    Time  6    Period  Weeks    Status  New      OT SHORT TERM GOAL #3   Title  Patient will increase gross  motor coordination by moving 10 or more blocks during the blocks and box test allow him to complete self feeding tasks with less difficulty.    Time  6    Period  Weeks    Status  New      OT SHORT TERM GOAL #4   Title  Patient will increase his right hand grip strength by 6# and his pinch strength by 3# in order to hold his utensil during meals without dropping or fatigue.    Time  6    Period  Weeks    Status  New      OT SHORT TERM GOAL #5   Title  Patient will be educated on adaptive eqipment/strategies that may help improve overall motor control when completing basic self care tasks.    Time  6  Period  Weeks    Status  New               Plan - 07/30/19 1240    Clinical Impression Statement  A: Pt is an 86 y/o male S/P left CVA with decreased motor control on the right dominant UE resulting in decreased gross and fine motor coordination causing difficult when attempting to complete any ADL task.    OT Occupational Profile and History  Problem Focused Assessment - Including review of records relating to presenting problem    Occupational performance deficits (Please refer to evaluation for details):  ADL's;IADL's;Leisure    Body Structure / Function / Physical Skills  ADL;UE functional use;FMC;GMC;Coordination;IADL;Dexterity;Strength    Rehab Potential  Good    Clinical Decision Making  Several treatment options, min-mod task modification necessary    Comorbidities Affecting Occupational Performance:  May have comorbidities impacting occupational performance    Modification or Assistance to Complete Evaluation   Min-Moderate modification of tasks or assist with assess necessary to complete eval    OT Frequency  2x / week    OT Duration  6 weeks    OT Treatment/Interventions  Self-care/ADL training;Ultrasound;Patient/family education;DME and/or AE instruction;Paraffin;Cryotherapy;Electrical Stimulation;Moist Heat;Therapeutic exercise;Manual Therapy;Therapeutic  activities;Neuromuscular education    Plan  P: Patient will benefit from skilled OT services to increase functional performance during ADL tasks while using her right UE for more self care tasks. Treatment plan: Provide coordination HEP. Work on increasing gross motor coordination first by increasing scapular and proximal shoulder stability. If gross motor improves then work on fine motor. adaptive equipment as needed to assist with task completion; wrist weights.    OT Home Exercise Plan  eval: stress ball for grip and pinch strengthening    Consulted and Agree with Plan of Care  Patient;Family member/caregiver    Family Member Consulted  Daughter; [name]       Patient will benefit from skilled therapeutic intervention in order to improve the following deficits and impairments:   Body Structure / Function / Physical Skills: ADL, UE functional use, FMC, GMC, Coordination, IADL, Dexterity, Strength       Visit Diagnosis: Other symptoms and signs involving the musculoskeletal system - Plan: Ot plan of care cert/re-cert  Other lack of coordination - Plan: Ot plan of care cert/re-cert    Problem List Patient Active Problem List   Diagnosis Date Noted  . Sundowning 07/04/2019  . Bilateral hearing loss 04/16/2019  . Urinary frequency 03/21/2019  . Chronic right shoulder pain 12/19/2018  . Weakness generalized 09/28/2018  . Palliative care encounter 09/28/2018  . Pressure injury of skin 08/27/2018  . COPD (chronic obstructive pulmonary disease) ([place]) 08/26/2018  . AMS (altered mental status) 08/22/2018  . Altered mental status 08/21/2018  . UTI (urinary tract infection) 08/21/2018  . Abnormality of gait 08/20/2018  . Prolonged Q-T interval on ECG   . Dysphagia, post-stroke   . Supplemental oxygen dependent   . Anxiety state   . Fatigue   . SOB (shortness of breath)   . Hyperglycemia   . History of lung cancer   . Depression   . CKD (chronic kidney disease), stage III   .  Acute blood loss anemia   . Fall 07/16/2018  . 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 Acute spontaneous 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 ([place]) L thalamic 07/14/2018  . Cellulitis of arm, right 06/01/2018  . Rash and nonspecific skin eruption 06/01/2018  .  Chest pain 02/02/2018  . Thrombocytopenia ([place]) 02/02/2018  . Hypokalemia 02/02/2018  . Pulmonary embolism ([employer]) 02/02/2018  . Elevated troponin 02/02/2018  . Pulmonary embolus ([place]) 02/02/2018  . Non-ST elevation MI (NSTEMI) ([place]) 06/27/2017  . Diastolic CHF ([place]) 31/54/0086  . Chest pain due to CAD ([place]) 06/26/2017  . Dyspnea 06/21/2017  . Acute bronchitis 05/15/2017  . Oral candidiasis 05/15/2017  . Acute pericarditis   . Chest pain at rest 12/20/2016  . Angina pectoris ([place]) 12/20/2016  . Radiation pneumonitis ([place]) 11/09/2016  . Pleurisy 10/11/2016  . Chest wall pain 10/11/2016  . Atrial fibrillation ([place]) 05/21/2016  . Adenocarcinoma of right lung ([place]) 01/06/2016  . GAD (generalized anxiety disorder) 11/20/2015  . Essential hypertension   . Cellulitis 09/04/2015  . Cellulitis of left axilla   . Esophageal ulcer without bleeding   . Bilateral lower extremity edema 04/28/2014  . BPH (benign prostatic hyperplasia) 10/08/2012  . Anxiety   . EKG abnormalities 09/05/2012  . Tremor 09/05/2012  . Chronic fatigue 09/05/2012  . Arthritis   . Asthma, chronic   . Reflux esophagitis   . S/P CABG (coronary artery bypass graft)   . GERD (gastroesophageal reflux disease)   . Hyperlipidemia   . Hypertension   . UNSPECIFIED PERIPHERAL VASCULAR DISEASE 06/16/2009   Ailene Ravel, OTR/L,CBIS  [phone]  07/30/2019, 1:05 PM  [place] [address] [place], [place], [address] Phone: [phone]   Fax:  [phone]  Name: Evan [name] MRN: [id number] Date of Birth: 1927/07/02

## 2019-07-30 NOTE — Therapy (Signed)
[facility] [address] [place], [place], [address] Phone: [phone]   Fax:  [phone]  Physical Therapy Treatment  Patient Details  Name: Evan [name] MRN: [id number] Date of Birth: [birth year] Referring Provider (PT): [name] MD    Encounter Date: 07/30/2019  PT End of Session - 07/30/19 0956    Visit Number  5    Number of Visits  12    Date for PT Re-Evaluation  08/23/19    Authorization Type  [employer]    Authorization - Number of Visits  5    Progress Note Due on Visit  10    PT Start Time  0910    PT Stop Time  0950    PT Time Calculation (min)  40 min    Equipment Utilized During Treatment  Gait belt    Activity Tolerance  Patient limited by fatigue    Behavior During Therapy  [facility] for tasks assessed/performed       Past Medical History:  Diagnosis Date  . Adenocarcinoma of right lung ([place]) 01/06/2016  . Anginal chest pain at rest [facility]    Chronic non, controlled on Lorazepam  . Anxiety   . Arthritis    "all over"   . BPH (benign prostatic hyperplasia)   . CAD (coronary artery disease)   . Chronic lower back pain   . Complication of anesthesia    "problems making water afterwards"  . Depression   . DJD (degenerative joint disease)   . Esophageal ulcer without bleeding    bid ppi indefinitely  . Family history of adverse reaction to anesthesia    "children w/PONV"  . GERD (gastroesophageal reflux disease)   . Headache    "couple/week maybe" (09/04/2015)  . Hemochromatosis    Possible, elevated iron stores, hook-like osteophytes on hand films, normal LFTs  . Hepatitis    "yellow jaundice as a baby"  . History of ASCVD    MULTIVESSEL  . Hyperlipidemia   . Hypertension   . Osteoarthritis   . Pneumonia    "several times; got a little now" (09/04/2015)  . Rheumatoid arthritis ([place])   . Subdural hematoma (HCC)    small after fall 09/2016-plavix held, neurosurgery consulted.  Asymptomatic.    Marland Kitchen  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[place]) 02/2018   on Lovenox lifelong since he failed PO Eliquis    Past Surgical History:  Procedure Laterality Date  . ARM SKIN LESION BIOPSY / EXCISION Left 09/02/2015  . CATARACT EXTRACTION W/ INTRAOCULAR LENS  IMPLANT, BILATERAL Bilateral   . CHOLECYSTECTOMY OPEN    . CORNEAL TRANSPLANT Bilateral    "one at [facility]; one at [facility]"  . CORONARY ANGIOPLASTY WITH STENT PLACEMENT    . CORONARY ARTERY BYPASS GRAFT  1999  . DESCENDING AORTIC ANEURYSM REPAIR W/ STENT     DE stent ostium into the right radial free graft at OM-, 02-2007  . ESOPHAGOGASTRODUODENOSCOPY N/A 11/09/2014   Procedure: ESOPHAGOGASTRODUODENOSCOPY (EGD);  Surgeon: [name], MD;  Location: [facility] ENDOSCOPY;  Service: Endoscopy;  Laterality: N/A;  . INGUINAL HERNIA REPAIR    . JOINT REPLACEMENT    . LEFT HEART CATH AND CORS/GRAFTS ANGIOGRAPHY N/A 06/27/2017   Procedure: LEFT HEART CATH AND CORS/GRAFTS ANGIOGRAPHY;  Surgeon: [name], MD;  Location: [place] CV LAB;  Service: Cardiovascular;  Laterality: N/A;  . NASAL SINUS SURGERY    . SHOULDER OPEN ROTATOR CUFF REPAIR Right   . TOTAL KNEE ARTHROPLASTY Bilateral  There were no vitals filed for this visit.  Subjective Assessment - 07/30/19 0955    Subjective  Pt arrived wiht daughter, no reports of pain or recent event.    Patient is accompained by:  Family member   daughter   Pertinent History  CVA effecting RT side on 07/13/18, lumbar fracture December 2021    Patient Stated Goals  Be able to walk    Currently in Pain?  No/denies                       [facility] Adult PT Treatment/Exercise - 07/30/19 0001      Ambulation/Gait   Ambulation/Gait  Yes    Ambulation/Gait Assistance  4: Min guard    Ambulation Distance (Feet)  40 Feet   22 then 18 ft   Assistive device  Rolling walker   WC behind   Gait Pattern  Step-to pattern;Decreased step length - right;Decreased step length - left;Decreased stance time - right;Decreased stride  length;Decreased dorsiflexion - right;Ataxic;Trunk flexed   Rt foot inverted   Ambulation Surface  Level;Indoor      Knee/Hip Exercises: Standing   Heel Raises  5 reps    Heel Raises Limitations  Rt foot inverted    Forward Step Up  Both;5 reps;Hand Hold: 2;Step Height: 2"    Gait Training  40 total 22 then 63ft      Knee/Hip Exercises: Seated   Long Arc Quad  Both;2 sets;10 reps    Other Seated Knee/Hip Exercises  seated heel/toe raises 2 x 10    Marching  Both;2 sets;10 reps    Marching Limitations  over 6in hurdle    Sit to Sand  2 sets;5 reps;with UE support   cueing for safety wiht hand placement            PT Education - 07/30/19 1024    Education Details  Discussed benefits of orthotic to reduce Rt inversion during gait and standing    Person(s) Educated  Patient;Child(ren)    Methods  Explanation;Handout    Comprehension  Verbalized understanding       PT Short Term Goals - 07/10/19 0950      PT SHORT TERM GOAL #1   Title  Patient will be independent with initial HEP to improve functional outcomes    Time  3    Period  Weeks    Status  New    Target Date  08/02/19      PT SHORT TERM GOAL #2   Title  Patient will be able to perform stand x 5 in < 40 seconds to demonstrate improvement in functional mobility and reduced risk for falls.    Time  3    Period  Weeks    Status  New    Target Date  08/02/19        PT Long Term Goals - 07/10/19 0951      PT LONG TERM GOAL #1   Title  Patient will be able to perform stand x 5 in < 25 seconds to demonstrate improvement in functional mobility and reduced risk for falls.    Time  6    Period  Weeks    Status  New    Target Date  08/23/19      PT LONG TERM GOAL #2   Title  Patient will be able to perform TUG in <15 seconds, using least restrictive assistive device, to demonstrate improvement in functional mobility and reduced  risk for falls.    Time  6    Period  Weeks    Status  New    Target Date   08/23/19      PT LONG TERM GOAL #3   Title  Patient will have equal to or > 4/5 MMT throughout BLE to improve ability to perform functional mobility, stair ambulation and ADLs.    Time  6    Period  Weeks    Status  New    Target Date  08/23/19            Plan - 07/30/19 1204    Clinical Impression Statement  Pt continue to be limited by fatigue, required frequent seated rest breaks.  Pt required verbal and tactile cueing for safely placing hands and posterior legs prior sit to stand.  Improved throughout session none to minimal cuieng required.  Added step up training on 2in step, pt limited by fatigue and Rt foot inverted when attempting to heel raise.  No reoprts of pain.  His daughter sat through session for improved feedback following therapy.    Personal Factors and Comorbidities  Age;Comorbidity 2    Comorbidities  HX ov CVA, lumbar fracture    Examination-Activity Limitations  Bathing;Bed Mobility;Bend;Squat;Stairs;Carry;Stand;Toileting;Dressing;Transfers;Hygiene/Grooming;Lift;Locomotion Level    Examination-Participation Restrictions  Community Activity;Other;Cleaning    Stability/Clinical Decision Making  Stable/Uncomplicated    Clinical Decision Making  Low    Rehab Potential  Fair    PT Frequency  2x / week    PT Duration  6 weeks    PT Treatment/Interventions  ADLs/Self Care Home Management;Aquatic Therapy;Biofeedback;Cryotherapy;Electrical Stimulation;Iontophoresis 4mg /ml Dexamethasone;Moist Heat;Traction;Balance training;Manual techniques;Therapeutic exercise;Vasopneumatic Device;Wheelchair mobility training;Therapeutic activities;Splinting;Taping;Functional mobility training;Stair training;Orthotic Fit/Training;Gait training;DME Instruction;Patient/family education;Passive range of motion;Dry needling;Energy conservation;Other (comment);Joint Manipulations;Spinal Manipulations;Compression bandaging;Neuromuscular re-education;Cognitive  remediation;Ultrasound;Parrafin;Fluidtherapy;Contrast Bath    PT Next Visit Plan  continue with static balance and gait progression as tolerated.    PT Home Exercise Plan  07/10/19: seated march, LAQs, seated heel/toe raise; 07/15/19: unsupported sitting 1-2 min rounds       Patient will benefit from skilled therapeutic intervention in order to improve the following deficits and impairments:  Abnormal gait, Decreased endurance, Hypomobility, Impaired sensation, Decreased activity tolerance, Decreased strength, Impaired UE functional use, Decreased balance, Decreased mobility, Difficulty walking, Decreased coordination, Decreased safety awareness, Postural dysfunction  Visit Diagnosis: Other abnormalities of gait and mobility  Muscle weakness (generalized)     Problem List Patient Active Problem List   Diagnosis Date Noted  . Sundowning 07/04/2019  . Bilateral hearing loss 04/16/2019  . Urinary frequency 03/21/2019  . Chronic right shoulder pain 12/19/2018  . Weakness generalized 09/28/2018  . Palliative care encounter 09/28/2018  . Pressure injury of skin 08/27/2018  . COPD (chronic obstructive pulmonary disease) ([place]) 08/26/2018  . AMS (altered mental status) 08/22/2018  . Altered mental status 08/21/2018  . UTI (urinary tract infection) 08/21/2018  . Abnormality of gait 08/20/2018  . Prolonged Q-T interval on ECG   . Dysphagia, post-stroke   . Supplemental oxygen dependent   . Anxiety state   . Fatigue   . SOB (shortness of breath)   . Hyperglycemia   . History of lung cancer   . Depression   . CKD (chronic kidney disease), stage III   . Acute blood loss anemia   . Fall 07/16/2018  . 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 Acute spontaneous 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 ([place]) L thalamic 07/14/2018  . Cellulitis of  arm, right 06/01/2018  . Rash and nonspecific skin eruption 06/01/2018  .  Chest pain 02/02/2018  . Thrombocytopenia ([place]) 02/02/2018  . Hypokalemia 02/02/2018  . Pulmonary embolism ([place]) 02/02/2018  . Elevated troponin 02/02/2018  . Pulmonary embolus ([place]) 02/02/2018  . Non-ST elevation MI (NSTEMI) ([place]) 06/27/2017  . Diastolic CHF ([place]) 29/51/8841  . Chest pain due to CAD ([place]) 06/26/2017  . Dyspnea 06/21/2017  . Acute bronchitis 05/15/2017  . Oral candidiasis 05/15/2017  . Acute pericarditis   . Chest pain at rest 12/20/2016  . Angina pectoris ([place]) 12/20/2016  . Radiation pneumonitis ([place]) 11/09/2016  . Pleurisy 10/11/2016  . Chest wall pain 10/11/2016  . Atrial fibrillation ([place]) 05/21/2016  . Adenocarcinoma of right lung ([place]) 01/06/2016  . GAD (generalized anxiety disorder) 11/20/2015  . Essential hypertension   . Cellulitis 09/04/2015  . Cellulitis of left axilla   . Esophageal ulcer without bleeding   . Bilateral lower extremity edema 04/28/2014  . BPH (benign prostatic hyperplasia) 10/08/2012  . Anxiety   . EKG abnormalities 09/05/2012  . Tremor 09/05/2012  . Chronic fatigue 09/05/2012  . Arthritis   . Asthma, chronic   . Reflux esophagitis   . S/P CABG (coronary artery bypass graft)   . GERD (gastroesophageal reflux disease)   . Hyperlipidemia   . Hypertension   . UNSPECIFIED PERIPHERAL VASCULAR DISEASE 06/16/2009   Ihor Austin, LPTA/CLT; CBIS [phone]  Aldona Lento 07/30/2019, 12:09 PM  [facility], [place], [address] Phone: [phone]   Fax:  [phone]  Name: Evan [name] MRN: [id number] Date of Birth: Aug 20, 1927

## 2019-07-31 ENCOUNTER — Other Ambulatory Visit: Payer: Self-pay | Admitting: Student

## 2019-07-31 DIAGNOSIS — S32010A Wedge compression fracture of first lumbar vertebra, initial encounter for closed fracture: Secondary | ICD-10-CM

## 2019-08-01 ENCOUNTER — Other Ambulatory Visit: Payer: Self-pay

## 2019-08-01 ENCOUNTER — Ambulatory Visit (HOSPITAL_COMMUNITY): Payer: Medicare HMO | Admitting: Physical Therapy

## 2019-08-01 ENCOUNTER — Ambulatory Visit (HOSPITAL_COMMUNITY): Payer: Medicare HMO

## 2019-08-01 ENCOUNTER — Encounter (HOSPITAL_COMMUNITY): Payer: Self-pay

## 2019-08-01 ENCOUNTER — Encounter (HOSPITAL_COMMUNITY): Payer: Self-pay | Admitting: Physical Therapy

## 2019-08-01 DIAGNOSIS — R278 Other lack of coordination: Secondary | ICD-10-CM

## 2019-08-01 DIAGNOSIS — M6281 Muscle weakness (generalized): Secondary | ICD-10-CM

## 2019-08-01 DIAGNOSIS — R2689 Other abnormalities of gait and mobility: Secondary | ICD-10-CM

## 2019-08-01 DIAGNOSIS — R29898 Other symptoms and signs involving the musculoskeletal system: Secondary | ICD-10-CM | POA: Diagnosis not present

## 2019-08-01 NOTE — Therapy (Signed)
[place], [place], [address] Phone: [phone]   Fax:  [phone]  Physical Therapy Treatment  Patient Details  Name: Evan [name] MRN: [id number] Date of Birth: 1928-04-01 Referring Provider (PT): [name] MD    Encounter Date: 08/01/2019  PT End of Session - 08/01/19 0905    Visit Number  6    Number of Visits  12    Date for PT Re-Evaluation  08/23/19    Authorization Type  [employer]    Authorization - Number of Visits  6    Progress Note Due on Visit  10    PT Start Time  0905    PT Stop Time  0945    PT Time Calculation (min)  40 min    Equipment Utilized During Treatment  Gait belt    Activity Tolerance  Patient limited by fatigue    Behavior During Therapy  [facility] for tasks assessed/performed       Past Medical History:  Diagnosis Date  . Adenocarcinoma of right lung ([place]) 01/06/2016  . Anginal chest pain at rest [facility])    Chronic non, controlled on Lorazepam  . Anxiety   . Arthritis    "all over"   . BPH (benign prostatic hyperplasia)   . CAD (coronary artery disease)   . Chronic lower back pain   . Complication of anesthesia    "problems making water afterwards"  . Depression   . DJD (degenerative joint disease)   . Esophageal ulcer without bleeding    bid ppi indefinitely  . Family history of adverse reaction to anesthesia    "children w/PONV"  . GERD (gastroesophageal reflux disease)   . Headache    "couple/week maybe" (09/04/2015)  . Hemochromatosis    Possible, elevated iron stores, hook-like osteophytes on hand films, normal LFTs  . Hepatitis    "yellow jaundice as a baby"  . History of ASCVD    MULTIVESSEL  . Hyperlipidemia   . Hypertension   . Osteoarthritis   . Pneumonia    "several times; got a little now" (09/04/2015)  . Rheumatoid arthritis ([place])   . Subdural hematoma (HCC)    small after fall 09/2016-plavix held, neurosurgery consulted.  Asymptomatic.    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 ARM SKIN LESION BIOPSY / EXCISION Left 09/02/2015  . CATARACT EXTRACTION W/ INTRAOCULAR LENS  IMPLANT, BILATERAL Bilateral   . CHOLECYSTECTOMY OPEN    . CORNEAL TRANSPLANT Bilateral    "one at [facility]; one at [facility]"  . CORONARY ANGIOPLASTY WITH STENT PLACEMENT    . CORONARY ARTERY BYPASS GRAFT  1999  . DESCENDING AORTIC ANEURYSM REPAIR W/ STENT     DE stent ostium into the right radial free graft at OM-, 02-2007  . ESOPHAGOGASTRODUODENOSCOPY N/A 11/09/2014   Procedure: ESOPHAGOGASTRODUODENOSCOPY (EGD);  Surgeon: [name], MD;  Location: [facility] ENDOSCOPY;  Service: Endoscopy;  Laterality: N/A;  . INGUINAL HERNIA REPAIR    . JOINT REPLACEMENT    . LEFT HEART CATH AND CORS/GRAFTS ANGIOGRAPHY N/A 06/27/2017   Procedure: LEFT HEART CATH AND CORS/GRAFTS ANGIOGRAPHY;  Surgeon: [name], MD;  Location: [facility] CV LAB;  Service: Cardiovascular;  Laterality: N/A;  . NASAL SINUS SURGERY    . SHOULDER OPEN ROTATOR CUFF REPAIR Right   . TOTAL KNEE ARTHROPLASTY Bilateral  There were no vitals filed for this visit.  Subjective Assessment - 08/01/19 0908    Subjective  Patients daughter says she can tell he is getting stronger at home, is able to do more.    Patient is accompained by:  Family member   daughter   Pertinent History  CVA effecting RT side on 07/13/18, lumbar fracture December 2021    Limitations  Lifting;Standing;Walking    How long can you stand comfortably?  1-2 minutes    How long can you walk comfortably?  60 feet with RW    Patient Stated Goals  Be able to walk    Currently in Pain?  No/denies                       [facility] Adult PT Treatment/Exercise - 08/01/19 0001      Knee/Hip Exercises: Standing   Heel Raises  Both;10 reps    Heel Raises Limitations  Rt foot inverted    Knee Flexion  Both;10 reps;2 sets    Knee  Flexion Limitations  cues for avoiding RT foot inversion, HHA x2 at // bars     Forward Step Up  Both;1 set;5 sets;Hand Hold: 2;Step Height: 2"   cues for RT foot and RT HHA on bars    Gait Training  35 feet with RW and WC follow    Other Standing Knee Exercises  staggered stance, solid floor, 2 x 10" each with CG        Knee/Hip Exercises: Seated   Marching  Both;2 sets;10 reps    Marching Limitations  over 6in hurdle    Sit to Sand  5 reps;with UE support;1 set   cues for weight shift, hand placement, foot placement               PT Short Term Goals - 07/10/19 0950      PT SHORT TERM GOAL #1   Title  Patient will be independent with initial HEP to improve functional outcomes    Time  3    Period  Weeks    Status  New    Target Date  08/02/19      PT SHORT TERM GOAL #2   Title  Patient will be able to perform stand x 5 in < 40 seconds to demonstrate improvement in functional mobility and reduced risk for falls.    Time  3    Period  Weeks    Status  New    Target Date  08/02/19        PT Long Term Goals - 07/10/19 0951      PT LONG TERM GOAL #1   Title  Patient will be able to perform stand x 5 in < 25 seconds to demonstrate improvement in functional mobility and reduced risk for falls.    Time  6    Period  Weeks    Status  New    Target Date  08/23/19      PT LONG TERM GOAL #2   Title  Patient will be able to perform TUG in <15 seconds, using least restrictive assistive device, to demonstrate improvement in functional mobility and reduced risk for falls.    Time  6    Period  Weeks    Status  New    Target Date  08/23/19      PT LONG TERM GOAL #3   Title  Patient will have equal to or > 4/5  MMT throughout BLE to improve ability to perform functional mobility, stair ambulation and ADLs.    Time  6    Period  Weeks    Status  New    Target Date  08/23/19            Plan - 08/01/19 0943    Clinical Impression Statement  Patient continues to be  limited by fatigue with activity. Able to progress standing activity but with increased breaks for rest. Patient requires continued cueing for foot and hand placement with sit to stand transfers, and requires cues for controlled descent. Patient continues to require CG for safety with static balance activity to maintain balance and fatigues quickly. Patient does show some improvement with RT foot control in WB, when cued to avoid inversion, but with increased difficulty when fatigues.    Personal Factors and Comorbidities  Age;Comorbidity 2    Comorbidities  HX ov CVA, lumbar fracture    Examination-Activity Limitations  Bathing;Bed Mobility;Bend;Squat;Stairs;Carry;Stand;Toileting;Dressing;Transfers;Hygiene/Grooming;Lift;Locomotion Level    Examination-Participation Restrictions  Community Activity;Other;Cleaning    Stability/Clinical Decision Making  Stable/Uncomplicated    Rehab Potential  Fair    PT Frequency  2x / week    PT Duration  6 weeks    PT Treatment/Interventions  ADLs/Self Care Home Management;Aquatic Therapy;Biofeedback;Cryotherapy;Electrical Stimulation;Iontophoresis 4mg /ml Dexamethasone;Moist Heat;Traction;Balance training;Manual techniques;Therapeutic exercise;Vasopneumatic Device;Wheelchair mobility training;Therapeutic activities;Splinting;Taping;Functional mobility training;Stair training;Orthotic Fit/Training;Gait training;DME Instruction;Patient/family education;Passive range of motion;Dry needling;Energy conservation;Other (comment);Joint Manipulations;Spinal Manipulations;Compression bandaging;Neuromuscular re-education;Cognitive remediation;Ultrasound;Parrafin;Fluidtherapy;Contrast Bath    PT Next Visit Plan  continue with static balance and gait progression as tolerated.    PT Home Exercise Plan  07/10/19: seated march, LAQs, seated heel/toe raise; 07/15/19: unsupported sitting 1-2 min rounds       Patient will benefit from skilled therapeutic intervention in order to improve  the following deficits and impairments:  Abnormal gait, Decreased endurance, Hypomobility, Impaired sensation, Decreased activity tolerance, Decreased strength, Impaired UE functional use, Decreased balance, Decreased mobility, Difficulty walking, Decreased coordination, Decreased safety awareness, Postural dysfunction  Visit Diagnosis: Other abnormalities of gait and mobility  Muscle weakness (generalized)     Problem List Patient Active Problem List   Diagnosis Date Noted  . Sundowning 07/04/2019  . Bilateral hearing loss 04/16/2019  . Urinary frequency 03/21/2019  . Chronic right shoulder pain 12/19/2018  . Weakness generalized 09/28/2018  . Palliative care encounter 09/28/2018  . Pressure injury of skin 08/27/2018  . COPD (chronic obstructive pulmonary disease) ([place]) 08/26/2018  . AMS (altered mental status) 08/22/2018  . Altered mental status 08/21/2018  . UTI (urinary tract infection) 08/21/2018  . Abnormality of gait 08/20/2018  . Prolonged Q-T interval on ECG   . Dysphagia, post-stroke   . Supplemental oxygen dependent   . Anxiety state   . Fatigue   . SOB (shortness of breath)   . Hyperglycemia   . History of lung cancer   . Depression   . CKD (chronic kidney disease), stage III   . Acute blood loss anemia   . Fall 07/16/2018  . 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 Acute spontaneous 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 ([place]) L thalamic 07/14/2018  . Cellulitis of arm, right 06/01/2018  . Rash and nonspecific skin eruption 06/01/2018  . Chest pain 02/02/2018  . Thrombocytopenia ([place]) 02/02/2018  . Hypokalemia 02/02/2018  . Pulmonary embolism ([place]) 02/02/2018  . Elevated troponin 02/02/2018  . Pulmonary embolus ([place]) 02/02/2018  . Non-ST elevation MI (NSTEMI) ([place]) 06/27/2017  . Diastolic CHF ([place])  06/26/2017  . Chest pain due to CAD ([place]) 06/26/2017  . Dyspnea 06/21/2017  . Acute  bronchitis 05/15/2017  . Oral candidiasis 05/15/2017  . Acute pericarditis   . Chest pain at rest 12/20/2016  . Angina pectoris ([place]) 12/20/2016  . Radiation pneumonitis ([place]) 11/09/2016  . Pleurisy 10/11/2016  . Chest wall pain 10/11/2016  . Atrial fibrillation ([place]) 05/21/2016  . Adenocarcinoma of right lung ([place]) 01/06/2016  . GAD (generalized anxiety disorder) 11/20/2015  . Essential hypertension   . Cellulitis 09/04/2015  . Cellulitis of left axilla   . Esophageal ulcer without bleeding   . Bilateral lower extremity edema 04/28/2014  . BPH (benign prostatic hyperplasia) 10/08/2012  . Anxiety   . EKG abnormalities 09/05/2012  . Tremor 09/05/2012  . Chronic fatigue 09/05/2012  . Arthritis   . Asthma, chronic   . Reflux esophagitis   . S/P CABG (coronary artery bypass graft)   . GERD (gastroesophageal reflux disease)   . Hyperlipidemia   . Hypertension   . UNSPECIFIED PERIPHERAL VASCULAR DISEASE 06/16/2009    11:32 AM, 08/01/19 Josue Hector PT DPT  Physical Therapist with [facility]  [phone] [place] [address] [place], [place], [address] Phone: [phone]   Fax:  [phone]  Name: Evan [name] MRN: [id number] Date of Birth: June 09, 1927

## 2019-08-01 NOTE — Patient Instructions (Signed)
Occupational Therapy Coordination Exercises  Complete 2-3 times a day as able. 10 times each.   1) Hand on table top or your lap:  Both arms straight out in front of you. Bend your wrists back and move your arm up while the left arm goes down as quick as you can. 10 times.   2) Hold you arm straight on in front of you, keep your elbow straight.  Write your name in the air 1-2 times quickly.  3) Put both of your hands on your lap or table with your palms down, turn your palms up and down as fast as you can. 10 times.  4) Put both of your hands on your lap or table keeping your wrists on your lap at all times.  Now pat on your lap quickly alternating right/left. 10 times.  5) Put your hands on your lap and drum your fingers.  Make sure your fingers are moving individually as fast as you can for 1 minute.

## 2019-08-01 NOTE — Therapy (Signed)
[place], [place], [address] Phone: [phone]   Fax:  [phone]  Occupational Therapy Treatment  Patient Details  Name: Evan [name] MRN: [id number] Date of Birth: October 19, 1927 Referring Provider (OT): [name]   Encounter Date: 08/01/2019  OT End of Session - 08/01/19 1256    Visit Number  2    Number of Visits  12    Date for OT Re-Evaluation  09/10/19    Authorization Type  [employer] H[employer]    Authorization Time Period  12 visits approved (07/30/19-09/10/19) $40 copay    Authorization - Visit Number  2    Authorization - Number of Visits  12    Progress Note Due on Visit  10    OT Start Time  [phone]    OT Stop Time  1024    OT Time Calculation (min)  36 min    Activity Tolerance  Patient tolerated treatment well    Behavior During Therapy  [facility] for tasks assessed/performed       Past Medical History:  Diagnosis Date  . Adenocarcinoma of right lung ([facility]) 01/06/2016  . Anginal chest pain at rest [facility])    Chronic non, controlled on Lorazepam  . Anxiety   . Arthritis    "all over"   . BPH (benign prostatic hyperplasia)   . CAD (coronary artery disease)   . Chronic lower back pain   . Complication of anesthesia    "problems making water afterwards"  . Depression   . DJD (degenerative joint disease)   . Esophageal ulcer without bleeding    bid ppi indefinitely  . Family history of adverse reaction to anesthesia    "children w/PONV"  . GERD (gastroesophageal reflux disease)   . Headache    "couple/week maybe" (09/04/2015)  . Hemochromatosis    Possible, elevated iron stores, hook-like osteophytes on hand films, normal LFTs  . Hepatitis    "yellow jaundice as a baby"  . History of ASCVD    MULTIVESSEL  . Hyperlipidemia   . Hypertension   . Osteoarthritis   . Pneumonia    "several times; got a little now" (09/04/2015)  . Rheumatoid arthritis ([place])   . Subdural hematoma ([facility])    small  after fall 09/2016-plavix held, neurosurgery consulted.  Asymptomatic.    Marland Kitchen 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[place]) 02/2018   on Lovenox lifelong since he failed PO Eliquis    Past Surgical History:  Procedure Laterality Date  . ARM SKIN LESION BIOPSY / EXCISION Left 09/02/2015  . CATARACT EXTRACTION W/ INTRAOCULAR LENS  IMPLANT, BILATERAL Bilateral   . CHOLECYSTECTOMY OPEN    . CORNEAL TRANSPLANT Bilateral    "one at [facility]; one at [facility]"  . CORONARY ANGIOPLASTY WITH STENT PLACEMENT    . CORONARY ARTERY BYPASS GRAFT  1999  . DESCENDING AORTIC ANEURYSM REPAIR W/ STENT     DE stent ostium into the right radial free graft at OM-, 02-2007  . ESOPHAGOGASTRODUODENOSCOPY N/A 11/09/2014   Procedure: ESOPHAGOGASTRODUODENOSCOPY (EGD);  Surgeon: [name], MD;  Location: [facility] ENDOSCOPY;  Service: Endoscopy;  Laterality: N/A;  . INGUINAL HERNIA REPAIR    . JOINT REPLACEMENT    . LEFT HEART CATH AND CORS/GRAFTS ANGIOGRAPHY N/A 06/27/2017   Procedure: LEFT HEART CATH AND CORS/GRAFTS ANGIOGRAPHY;  Surgeon: [name], MD;  Location: [place] CV LAB;  Service: Cardiovascular;  Laterality: N/A;  . NASAL SINUS SURGERY    . SHOULDER OPEN ROTATOR CUFF REPAIR  Right   . TOTAL KNEE ARTHROPLASTY Bilateral     There were no vitals filed for this visit.  Subjective Assessment - 08/01/19 0956    Subjective   S: They are hard. (when talking about HEP)    Patient is accompanied by:  Family member   Daughter: [name]   Currently in Pain?  No/denies         [facility] OT Assessment - 08/01/19 0957      Assessment   Medical Diagnosis  right hand weakness      Precautions   Precautions  Fall               OT Treatments/Exercises (OP) - 08/01/19 0957      Exercises   Exercises  Shoulder      Shoulder Exercises: Seated   Protraction  Strengthening;10 reps    Protraction Weight (lbs)  1   wrist weight     Shoulder Exercises: ROM/Strengthening   Proximal Shoulder Strengthening, Seated  Hand on small pink  ball with therapist stabilizing as able. 10 circles right and left. hand remaining on ball    Other ROM/Strengthening Exercises  Proximal shoulder strengthening holding small pink ball; chest press, flexion, circles (right/left). 10X. With 1# wrist weight; side to side and up and down proximal shoulder strengthening using therapist's hand as target.     Other ROM/Strengthening Exercises  Gross motor coordination activties complete at table while reviewing HEP.              OT Education - 08/01/19 1256    Education Details  Gross motor coordination HEP    Person(s) Educated  Patient;Child(ren)    Methods  Explanation;Demonstration;Verbal cues;Handout    Comprehension  Verbalized understanding;Returned demonstration;Need further instruction       OT Short Term Goals - 08/01/19 1310      OT SHORT TERM GOAL #1   Title  Patient and family will be educated and verbalize independence with HEP in order to increase functional use of his right hand while using for 25% or more of daily tasks.    Time  6    Period  Weeks    Status  On-going    Target Date  09/10/19      OT SHORT TERM GOAL #2   Title  Patient will increase scapular and proximal shoulder strengthening while demonstrating increased shoulder endurance during a sustained functional reaching task for more than 1 minute.    Time  6    Period  Weeks    Status  On-going      OT SHORT TERM GOAL #3   Title  Patient will increase gross motor coordination by moving 10 or more blocks during the blocks and box test allow him to complete self feeding tasks with less difficulty.    Time  6    Period  Weeks    Status  On-going      OT SHORT TERM GOAL #4   Title  Patient will increase his right hand grip strength by 6# and his pinch strength by 3# in order to hold his utensil during meals without dropping or fatigue.    Time  6    Period  Weeks    Status  On-going      OT SHORT TERM GOAL #5   Title  Patient will be educated on  adaptive eqipment/strategies that may help improve overall motor control when completing basic self care tasks.    Time  6  Period  Weeks    Status  On-going               Plan - 08/01/19 1301    Clinical Impression Statement  A: focused on right shoulder and scapular stability with and without a 1# wrist weight. Patient demonstrated increased fatigue very consistantly at repetition 6 out of 10. Provided verbal and visual cues/demonstration for all exercises/activities. HEP provided to work on gross motor coordination. Reviewed with patient and daughter.    Body Structure / Function / Physical Skills  ADL;UE functional use;FMC;GMC;Coordination;IADL;Dexterity;Strength    Plan  P: Follow up on HEP. Continue with shoulder/scapular stability activities. Seated isometrics?    Consulted and Agree with Plan of Care  Patient    Family Member Consulted  Daughter; [name]       Patient will benefit from skilled therapeutic intervention in order to improve the following deficits and impairments:   Body Structure / Function / Physical Skills: ADL, UE functional use, FMC, GMC, Coordination, IADL, Dexterity, Strength       Visit Diagnosis: Other lack of coordination  Other symptoms and signs involving the musculoskeletal system    Problem List Patient Active Problem List   Diagnosis Date Noted  . Sundowning 07/04/2019  . Bilateral hearing loss 04/16/2019  . Urinary frequency 03/21/2019  . Chronic right shoulder pain 12/19/2018  . Weakness generalized 09/28/2018  . Palliative care encounter 09/28/2018  . Pressure injury of skin 08/27/2018  . COPD (chronic obstructive pulmonary disease) ([place]) 08/26/2018  . AMS (altered mental status) 08/22/2018  . Altered mental status 08/21/2018  . UTI (urinary tract infection) 08/21/2018  . Abnormality of gait 08/20/2018  . Prolonged Q-T interval on ECG   . Dysphagia, post-stroke   . Supplemental oxygen dependent   . Anxiety state   .  Fatigue   . SOB (shortness of breath)   . Hyperglycemia   . History of lung cancer   . Depression   . CKD (chronic kidney disease), stage III   . Acute blood loss anemia   . Fall 07/16/2018  . 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 Acute spontaneous 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 ([place]) L thalamic 07/14/2018  . Cellulitis of arm, right 06/01/2018  . Rash and nonspecific skin eruption 06/01/2018  . Chest pain 02/02/2018  . Thrombocytopenia ([place]) 02/02/2018  . Hypokalemia 02/02/2018  . Pulmonary embolism ([place]) 02/02/2018  . Elevated troponin 02/02/2018  . Pulmonary embolus ([place]) 02/02/2018  . Non-ST elevation MI (NSTEMI) ([place]) 06/27/2017  . Diastolic CHF ([place]) 81/44/8185  . Chest pain due to CAD ([place]) 06/26/2017  . Dyspnea 06/21/2017  . Acute bronchitis 05/15/2017  . Oral candidiasis 05/15/2017  . Acute pericarditis   . Chest pain at rest 12/20/2016  . Angina pectoris ([place]) 12/20/2016  . Radiation pneumonitis ([place]) 11/09/2016  . Pleurisy 10/11/2016  . Chest wall pain 10/11/2016  . Atrial fibrillation ([place]) 05/21/2016  . Adenocarcinoma of right lung ([place]) 01/06/2016  . GAD (generalized anxiety disorder) 11/20/2015  . Essential hypertension   . Cellulitis 09/04/2015  . Cellulitis of left axilla   . Esophageal ulcer without bleeding   . Bilateral lower extremity edema 04/28/2014  . BPH (benign prostatic hyperplasia) 10/08/2012  . Anxiety   . EKG abnormalities 09/05/2012  . Tremor 09/05/2012  . Chronic fatigue 09/05/2012  . Arthritis   . Asthma, chronic   . Reflux esophagitis   . S/P CABG (coronary artery bypass graft)   .  GERD (gastroesophageal reflux disease)   . Hyperlipidemia   . Hypertension   . UNSPECIFIED PERIPHERAL VASCULAR DISEASE 06/16/2009   Ailene Ravel, OTR/L,CBIS  [phone]  08/01/2019, 1:11 PM  [place], [place], [address] Phone: [phone]   Fax:  [phone]  Name: Evan [name] MRN: [id number] Date of Birth: [birth year]

## 2019-08-02 ENCOUNTER — Ambulatory Visit (INDEPENDENT_AMBULATORY_CARE_PROVIDER_SITE_OTHER): Payer: Medicare HMO | Admitting: Family Medicine

## 2019-08-02 ENCOUNTER — Encounter: Payer: Self-pay | Admitting: Family Medicine

## 2019-08-02 VITALS — BP 98/62 | HR 76 | Temp 96.8°F | Resp 16 | Ht 68.0 in | Wt 167.0 lb

## 2019-08-02 DIAGNOSIS — I69351 Hemiplegia and hemiparesis following cerebral infarction affecting right dominant side: Secondary | ICD-10-CM | POA: Diagnosis not present

## 2019-08-02 NOTE — Progress Notes (Signed)
Subjective:    Patient ID: Evan [name], male    DOB: 08-05-1927, 86 y.o.   MRN: [id number]  HPI Patient is a very pleasant 86 year old Caucasian male who is here today with his daughter who is his primary caregiver.  Patient suffered a hemorrhagic stroke and has right-sided hemiparesis.  He has diminished sensation in his right leg.  He is unable to feel vibration, pain, or cold in his right leg.  He also has ataxia in his right arm and right leg.  He has chronic hemiparesis in the right leg due to the stroke.  As result he has chronic inversion of the right ankle as well as a foot drop in the right foot.  He has developed a pressure sore over the right lateral malleolus.  He requires an AFO brace to prevent/retard deterioration of his condition. Past Medical History:  Diagnosis Date  . Adenocarcinoma of right lung ([place]) 01/06/2016  . Anginal chest pain at rest [facility])    Chronic non, controlled on Lorazepam  . Anxiety   . Arthritis    "all over"   . BPH (benign prostatic hyperplasia)   . CAD (coronary artery disease)   . Chronic lower back pain   . Complication of anesthesia    "problems making water afterwards"  . Depression   . DJD (degenerative joint disease)   . Esophageal ulcer without bleeding    bid ppi indefinitely  . Family history of adverse reaction to anesthesia    "children w/PONV"  . GERD (gastroesophageal reflux disease)   . Headache    "couple/week maybe" (09/04/2015)  . Hemochromatosis    Possible, elevated iron stores, hook-like osteophytes on hand films, normal LFTs  . Hepatitis    "yellow jaundice as a baby"  . History of ASCVD    MULTIVESSEL  . Hyperlipidemia   . Hypertension   . Osteoarthritis   . Pneumonia    "several times; got a little now" (09/04/2015)  . Rheumatoid arthritis ([place])   . Subdural hematoma ([facility])    small after fall 09/2016-plavix held, neurosurgery consulted.  Asymptomatic.    Marland Kitchen 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[place]) 02/2018   on Lovenox lifelong since he  failed PO Eliquis   Past Surgical History:  Procedure Laterality Date  . ARM SKIN LESION BIOPSY / EXCISION Left 09/02/2015  . CATARACT EXTRACTION W/ INTRAOCULAR LENS  IMPLANT, BILATERAL Bilateral   . CHOLECYSTECTOMY OPEN    . CORNEAL TRANSPLANT Bilateral    "one at [facility]; one at [facility]"  . CORONARY ANGIOPLASTY WITH STENT PLACEMENT    . CORONARY ARTERY BYPASS GRAFT  1999  . DESCENDING AORTIC ANEURYSM REPAIR W/ STENT     DE stent ostium into the right radial free graft at OM-, 02-2007  . ESOPHAGOGASTRODUODENOSCOPY N/A 11/09/2014   Procedure: ESOPHAGOGASTRODUODENOSCOPY (EGD);  Surgeon: [name], MD;  Location: [facility] ENDOSCOPY;  Service: Endoscopy;  Laterality: N/A;  . INGUINAL HERNIA REPAIR    . JOINT REPLACEMENT    . LEFT HEART CATH AND CORS/GRAFTS ANGIOGRAPHY N/A 06/27/2017   Procedure: LEFT HEART CATH AND CORS/GRAFTS ANGIOGRAPHY;  Surgeon: [name], MD;  Location: [place] CV LAB;  Service: Cardiovascular;  Laterality: N/A;  . NASAL SINUS SURGERY    . SHOULDER OPEN ROTATOR CUFF REPAIR Right   . TOTAL KNEE ARTHROPLASTY Bilateral    Current Outpatient Medications on File Prior to Visit  Medication Sig Dispense Refill  . acetaminophen (TYLENOL) 325 MG tablet Take 1-2 tablets (325-650 mg total) by mouth every  4 (four) hours as needed for mild pain.    Marland Kitchen ALPRAZolam (XANAX) 0.5 MG tablet Take 0.5 tablets (0.25 mg total) by mouth 3 (three) times daily as needed for anxiety. 30 tablet 0  . Control Gel Formula Dressing (DUODERM CGF DRESSING) MISC Apply 1 each topically daily. 12 each 3  . diclofenac sodium (VOLTAREN) 1 % GEL Apply 2 g topically 4 (four) times daily. 350 g 1  . FLUoxetine (PROZAC) 20 MG capsule TAKE ONE CAPSULE BY MOUTH DAILY 90 capsule 3  . furosemide (LASIX) 20 MG tablet TAKE 1 TABLET ONCE A DAY. TAKE AN EXTRA TABLET FOR INCREASED SWELLING, WEIGHT GAIN, OR SHORTNESS OF BREATH 180 tablet 3  . hydrOXYzine (ATARAX/VISTARIL) 10 MG tablet Take 1 tablet (10 mg total) by mouth at  bedtime. 30 tablet 0  . Lifitegrast (XIIDRA) 5 % SOLN Place 1-2 drops into both eyes daily.     . magnesium oxide (MAG-OX) 400 (241.3 Mg) MG tablet Take 1 tablet (400 mg total) by mouth 2 (two) times daily. 180 tablet 3  . metoprolol tartrate (LOPRESSOR) 25 MG tablet Take 0.5 tablets (12.5 mg total) by mouth 2 (two) times daily. 90 tablet 3  . nitroGLYCERIN (NITROSTAT) 0.4 MG SL tablet Place 1 tablet (0.4 mg total) under the tongue every 5 (five) minutes as needed for chest pain. 25 tablet 2  . polyethylene glycol (MIRALAX / GLYCOLAX) 17 g packet Take 17 g by mouth daily as needed for mild constipation. 14 each 0  . Rivaroxaban (XARELTO) 15 MG TABS tablet Take 1 tablet (15 mg total) by mouth daily with supper. 90 tablet 3  . rosuvastatin (CRESTOR) 5 MG tablet TAKE 1 TABLET BY MOUTH ONCE DAILY 90 tablet 1  . silodosin (RAPAFLO) 8 MG CAPS capsule Take 8 mg by mouth daily with breakfast.    . tacrolimus (PROTOPIC) 0.1 % ointment Apply 1 application topically 2 (two) times daily.     No current facility-administered medications on file prior to visit.   Allergies  Allergen Reactions  . Feldene [Piroxicam] Other (See Comments)    REACTION: Blisters  . Neomycin-Polymyxin-Hc Itching  . Statins Other (See Comments)    Myalgias  . Doxycycline Other (See Comments)    REACTION:  unknown  . Latex Rash  . Tequin Anxiety  . Valium Other (See Comments)    REACTION:  unknown   Social History   Socioeconomic History  . Marital status: Widowed    Spouse name: Not on file  . Number of children: Not on file  . Years of education: Not on file  . Highest education level: Not on file  Occupational History  . Not on file  Tobacco Use  . Smoking status: Never Smoker  . Smokeless tobacco: Former Systems developer    Types: Chew  . Tobacco comment: "quit chewing in the 1960s"  Substance and Sexual Activity  . Alcohol use: No  . Drug use: No  . Sexual activity: Never  Other Topics Concern  . Not on file   Social History Narrative   01-05-18 Unable to ask abuse questions family with him today.   11-012-19 Unable to ask abuse questions family with him today.   Social Determinants of Health   Financial Resource Strain:   . Difficulty of Paying Living Expenses:   Food Insecurity:   . Worried About Charity fundraiser in the Last Year:   . Arboriculturist in the Last Year:   Transportation Needs:   .  Lack of Transportation (Medical):   Marland Kitchen Lack of Transportation (Non-Medical):   Physical Activity:   . Days of Exercise per Week:   . Minutes of Exercise per Session:   Stress:   . Feeling of Stress :   Social Connections:   . Frequency of Communication with Friends and Family:   . Frequency of Social Gatherings with Friends and Family:   . Attends Religious Services:   . Active Member of Clubs or Organizations:   . Attends Archivist Meetings:   Marland Kitchen Marital Status:   Intimate Partner Violence:   . Fear of Current or Ex-Partner:   . Emotionally Abused:   Marland Kitchen Physically Abused:   . Sexually Abused:       Review of Systems  All other systems reviewed and are negative.      Objective:   Physical Exam Vitals reviewed.  Constitutional:      Appearance: Normal appearance.  Cardiovascular:     Rate and Rhythm: Normal rate and regular rhythm.  Pulmonary:     Effort: Pulmonary effort is normal.     Breath sounds: Normal breath sounds.  Musculoskeletal:     Right ankle: Swelling present. Tenderness present.       Feet:  Neurological:     Mental Status: He is alert.   Right-sided hemiparesis with weakness in his right leg.  Weakness with knee flexion and extension and ankle flexion and extension.  Ataxia in the right upper arm and right lower leg.         Assessment & Plan:  Hemiparesis affecting right side as late effect of stroke [facility])  Patient requires an AFO brace for his right ankle to retard deterioration of his condition.  He has weakness in his right ankle  along with a chronic foot drop secondary to his hemiparesis.  He also has a pressure sore over his lateral malleolus.  The positioning created by an AFO will prevent fall secondary to the foot drop and also help retard deterioration of his pressure sore over his right lateral malleolus.  I will also help slow the development of contractures in his right ankle.  I wrote the patient a prescription for an AFO.  I will be glad to fill out any necessary paperwork.

## 2019-08-05 ENCOUNTER — Other Ambulatory Visit: Payer: Self-pay

## 2019-08-05 ENCOUNTER — Encounter: Payer: Self-pay | Admitting: Nurse Practitioner

## 2019-08-05 ENCOUNTER — Other Ambulatory Visit: Payer: Medicare HMO | Admitting: Nurse Practitioner

## 2019-08-05 DIAGNOSIS — R531 Weakness: Secondary | ICD-10-CM | POA: Diagnosis not present

## 2019-08-05 DIAGNOSIS — Z515 Encounter for palliative care: Secondary | ICD-10-CM

## 2019-08-05 NOTE — Progress Notes (Signed)
[facility] Consult Note Telephone: [phone]  Fax: [phone]  PATIENT NAME: Evan [name] DOB: [birth year] MRN: [id number]  PRIMARY CARE PROVIDER:   [name], MD  REFERRING PROVIDER:  [name], MD [address] [place],  [place] [address]  RESPONSIBLE PARTY:Self; Evan [name] [phone] or [phone]  Due to the COVID-19 crisis, this visit was done via telemedicine from my office and it was initiated and consent by this patient and or family.  RECOMMENDATIONS and PLAN: 1.ACP: DNR; in [place]  2.Generalized weaknesscontinue to work with therapy, fall precaution  3.Palliative care encounter ; Palliative medicine team will continue to support patient, patient's family, and medical team. Visit consisted of counseling and education dealing with the complex and emotionally intense issues of symptom management and palliative care in the setting of serious and potentially life-threatening illness  I spent 50 minutes providing this consultation,  from 3:00pm to 3:45pm. More than 50% of the time in this consultation was spent coordinating communication.   HISTORY OF PRESENT ILLNESS:  Evan [name] is a 86 y.o. 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 I called Evan [name], Evan [name] daughter for schedule telemedicine telephonic is video not available palliative care visit. Evan [name] and I talked about purpose of palliative care visit. Evan [name] and agreement. We talked about how Evan [name] has been doing. Evan [name] endorses he is doing much better with working with physical therapy. Evan [name] is bringing Evan. [name] to the physical therapy office which seems to be much better than coming to the home. Evan [name] endorses Evan. [name] has more motivation getting up getting dressed and going to an appointment. We talked about his progress with therapy. Evan [name] endorses that he is getting stronger with each session. No recent falls, wounds, hospitalizations. We talked about symptoms an appetite. Karen Kitchens endorses is Evan [name] is very careful to what he eats says he does not want to gain any weight. We talked about nutrition. We talked about supplements. We talked about chronic disease. We talked about medical goals of therapy. We talked about Evan. [name] getting his vaccines. Evan [name] endorses Evan. [name] is getting out more in the community since Evan. [name] and his family have been vaccinated. Evan [name] endorses that they have been going to a few restaurants out to eat. We talked about role of palliative care in plan of care. We talked about follow-up palliative care visit in two months if needed or sooner should he declined. Offered in person appointment but Evan [name] wishes for telephone, appointment scheduled. Therapeutic listening an emotional support provided. Contact information. Questions answered to satisfaction.  Palliative Care was asked to help to continue to address goals of care.   CODE STATUS:  DNR  PPS: 50% HOSPICE ELIGIBILITY/DIAGNOSIS: TBD  PAST MEDICAL HISTORY:  Past Medical History:  Diagnosis Date  . Adenocarcinoma of right lung ([place]) 01/06/2016  . Anginal chest pain at rest [facility])    Chronic non, controlled on Lorazepam  . Anxiety   . Arthritis    "all over"   . BPH (benign prostatic hyperplasia)   . CAD (coronary artery disease)   . Chronic lower back pain   . Complication of anesthesia    "problems making water afterwards"  . Depression   . DJD (degenerative joint disease)   . Esophageal ulcer without bleeding    bid ppi indefinitely  . Family history of adverse reaction to anesthesia    "children w/PONV"  . GERD (gastroesophageal reflux disease)   . Headache    "couple/week maybe" (09/04/2015)  . Hemochromatosis    Possible, elevated iron stores, hook-like osteophytes on hand films, normal LFTs  . Hepatitis    "yellow jaundice as a baby"  . History of ASCVD    MULTIVESSEL  . Hyperlipidemia   . Hypertension   . Osteoarthritis   . Pneumonia    "several times; got a little now" (09/04/2015)  . Rheumatoid arthritis ([place])   . Subdural hematoma (H[facility])    small after fall 09/2016-plavix held, neurosurgery consulted.  Asymptomatic.    Marland Kitchen 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[place]) 02/2018   on Lovenox lifelong since he failed PO Eliquis    SOCIAL HX:  Social History   Tobacco Use  . Smoking status: Never Smoker  . Smokeless tobacco: Former Systems developer    Types: Chew  . Tobacco comment: "quit chewing in the 1960s"  Substance Use Topics  . Alcohol use: No    ALLERGIES:  Allergies  Allergen Reactions  . Feldene [Piroxicam] Other (See Comments)    REACTION: Blisters  . Neomycin-Polymyxin-Hc Itching  . Statins Other (See Comments)    Myalgias  . Doxycycline Other (See Comments)    REACTION:  unknown  . Latex Rash  . Tequin Anxiety  . Valium Other (See Comments)    REACTION:  unknown     PERTINENT MEDICATIONS:  Outpatient Encounter Medications as of 08/05/2019  Medication Sig   . acetaminophen (TYLENOL) 325 MG tablet Take 1-2 tablets (325-650 mg total) by mouth every 4 (four) hours as needed for mild pain.  Marland Kitchen ALPRAZolam (XANAX) 0.5 MG tablet Take 0.5 tablets (0.25 mg total) by mouth 3 (three) times daily as needed for anxiety.  . Control Gel Formula Dressing (DUODERM CGF DRESSING) MISC Apply 1 each topically daily.  . diclofenac sodium (VOLTAREN) 1 % GEL Apply 2 g topically 4 (four) times daily.  Marland Kitchen FLUoxetine (PROZAC) 20 MG capsule TAKE ONE CAPSULE BY MOUTH DAILY  . furosemide (LASIX) 20 MG tablet TAKE 1 TABLET ONCE A DAY. TAKE AN EXTRA TABLET FOR INCREASED SWELLING, WEIGHT GAIN, OR SHORTNESS OF BREATH  . hydrOXYzine (ATARAX/VISTARIL) 10 MG tablet Take 1 tablet (10 mg total) by mouth at bedtime.  Marland Kitchen Lifitegrast (XIIDRA) 5 % SOLN Place 1-2 drops into both eyes daily.   . magnesium oxide (MAG-OX) 400 (241.3 Mg) MG tablet Take 1 tablet (400 mg total) by mouth 2 (two) times daily.  . metoprolol tartrate (LOPRESSOR) 25 MG tablet Take 0.5 tablets (12.5 mg total) by mouth 2 (two) times daily.  . nitroGLYCERIN (NITROSTAT) 0.4 MG SL tablet Place 1 tablet (0.4 mg total) under the tongue every 5 (five) minutes as needed for chest pain.  . polyethylene glycol (MIRALAX / GLYCOLAX) 17 g packet Take 17 g by mouth daily as  needed for mild constipation.  . Rivaroxaban (XARELTO) 15 MG TABS tablet Take 1 tablet (15 mg total) by mouth daily with supper.  . rosuvastatin (CRESTOR) 5 MG tablet TAKE 1 TABLET BY MOUTH ONCE DAILY  . silodosin (RAPAFLO) 8 MG CAPS capsule Take 8 mg by mouth daily with breakfast.  . tacrolimus (PROTOPIC) 0.1 % ointment Apply 1 application topically 2 (two) times daily.   No facility-administered encounter medications on file as of 08/05/2019.    PHYSICAL EXAM:   Deferred  Jenaya Saar Z Breydan [place], NP

## 2019-08-06 ENCOUNTER — Encounter (HOSPITAL_COMMUNITY): Payer: Self-pay | Admitting: Occupational Therapy

## 2019-08-06 ENCOUNTER — Encounter (HOSPITAL_COMMUNITY): Payer: Self-pay

## 2019-08-06 ENCOUNTER — Other Ambulatory Visit: Payer: Self-pay

## 2019-08-06 ENCOUNTER — Ambulatory Visit (HOSPITAL_COMMUNITY): Payer: Medicare HMO | Attending: Family Medicine

## 2019-08-06 ENCOUNTER — Ambulatory Visit (HOSPITAL_COMMUNITY): Payer: Medicare HMO | Admitting: Occupational Therapy

## 2019-08-06 DIAGNOSIS — M6281 Muscle weakness (generalized): Secondary | ICD-10-CM | POA: Diagnosis not present

## 2019-08-06 DIAGNOSIS — R29898 Other symptoms and signs involving the musculoskeletal system: Secondary | ICD-10-CM

## 2019-08-06 DIAGNOSIS — R2689 Other abnormalities of gait and mobility: Secondary | ICD-10-CM | POA: Insufficient documentation

## 2019-08-06 DIAGNOSIS — R278 Other lack of coordination: Secondary | ICD-10-CM | POA: Diagnosis not present

## 2019-08-06 NOTE — Therapy (Signed)
[place], [place], [address] Phone: [phone]   Fax:  [phone]  Occupational Therapy Treatment  Patient Details  Name: Evan [name] MRN: [id number] Date of Birth: [birth year] Referring Provider (OT): [name]   Encounter Date: 08/06/2019  OT End of Session - 08/06/19 1154    Visit Number  3    Number of Visits  12    Date for OT Re-Evaluation  09/10/19    Authorization Type  [employer] HMO    Authorization Time Period  12 visits approved (07/30/19-09/10/19) $40 copay    Authorization - Visit Number  3    Authorization - Number of Visits  12    Progress Note Due on Visit  10    OT Start Time  1116    OT Stop Time  1154    OT Time Calculation (min)  38 min    Activity Tolerance  Patient tolerated treatment well    Behavior During Therapy  [facility] for tasks assessed/performed       Past Medical History:  Diagnosis Date  . Adenocarcinoma of right lung ([place]) 01/06/2016  . Anginal chest pain at rest [facility])    Chronic non, controlled on Lorazepam  . Anxiety   . Arthritis    "all over"   . BPH (benign prostatic hyperplasia)   . CAD (coronary artery disease)   . Chronic lower back pain   . Complication of anesthesia    "problems making water afterwards"  . Depression   . DJD (degenerative joint disease)   . Esophageal ulcer without bleeding    bid ppi indefinitely  . Family history of adverse reaction to anesthesia    "children w/PONV"  . GERD (gastroesophageal reflux disease)   . Headache    "couple/week maybe" (09/04/2015)  . Hemochromatosis    Possible, elevated iron stores, hook-like osteophytes on hand films, normal LFTs  . Hepatitis    "yellow jaundice as a baby"  . History of ASCVD    MULTIVESSEL  . Hyperlipidemia   . Hypertension   . Osteoarthritis   . Pneumonia    "several times; got a little now" (09/04/2015)  . Rheumatoid arthritis ([place])   . Subdural hematoma ([facility])    small  after fall 09/2016-plavix held, neurosurgery consulted.  Asymptomatic.    Marland Kitchen 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[place]) 02/2018   on Lovenox lifelong since he failed PO Eliquis    Past Surgical History:  Procedure Laterality Date  . ARM SKIN LESION BIOPSY / EXCISION Left 09/02/2015  . CATARACT EXTRACTION W/ INTRAOCULAR LENS  IMPLANT, BILATERAL Bilateral   . CHOLECYSTECTOMY OPEN    . CORNEAL TRANSPLANT Bilateral    "[facility]; one at [facility]"  . CORONARY ANGIOPLASTY WITH STENT PLACEMENT    . CORONARY ARTERY BYPASS GRAFT  1999  . DESCENDING AORTIC ANEURYSM REPAIR W/ STENT     DE stent ostium into the right radial free graft at OM-, 02-2007  . ESOPHAGOGASTRODUODENOSCOPY N/A 11/09/2014   Procedure: ESOPHAGOGASTRODUODENOSCOPY (EGD);  Surgeon: [name], MD;  Location: [facility] ENDOSCOPY;  Service: Endoscopy;  Laterality: N/A;  . INGUINAL HERNIA REPAIR    . JOINT REPLACEMENT    . LEFT HEART CATH AND CORS/GRAFTS ANGIOGRAPHY N/A 06/27/2017   Procedure: LEFT HEART CATH AND CORS/GRAFTS ANGIOGRAPHY;  Surgeon: [name], MD;  Location: [facility];  Service: Cardiovascular;  Laterality: N/A;  . NASAL SINUS SURGERY    . SHOULDER OPEN ROTATOR CUFF REPAIR  Right   . TOTAL KNEE ARTHROPLASTY Bilateral     There were no vitals filed for this visit.  Subjective Assessment - 08/06/19 1116    Subjective   S: I haven't done the exercises too much.    Patient is accompanied by:  Family member   daughter   Currently in Pain?  Yes    Pain Score  10-Worst pain ever    Pain Location  Finger (Comment which one)   index   Pain Orientation  Left    Pain Descriptors / Indicators  Aching;Dull;Sore    Pain Type  Acute pain    Pain Radiating Towards  N/A    Pain Onset  In the past 7 days    Pain Frequency  Constant    Aggravating Factors   movement    Pain Relieving Factors  rest    Effect of Pain on Daily Activities  mod effect    Multiple Pain Sites  No         [facility] OT Assessment - 08/06/19 1116      Assessment    Medical Diagnosis  right hand weakness      Precautions   Precautions  Fall               OT Treatments/Exercises (OP) - 08/06/19 1121      Exercises   Exercises  Shoulder      Shoulder Exercises: ROM/Strengthening   Proximal Shoulder Strengthening, Seated  Hand on small pink ball with therapist stabilizing as able. 10 circles right and left. hand remaining on ball. Completed 2nd trial with 1# wrist weight going at diagonals to left and right sides.     Other ROM/Strengthening Exercises  Proximal shoulder strengthening holding small pink ball; chest press, flexion. 10X. With 1# wrist weight; side to side and up and down proximal shoulder strengthening using therapist's hand as target.     Other ROM/Strengthening Exercises  Gross motor coordination activties complete at table while reviewing HEP.       Shoulder Exercises: Isometric Strengthening   Flexion  --   seated 3x10"   Extension  --   seated 3x10"   ABduction  --   seated 3x10"   Other Isometric Exercises  elbow flexion/extension, seated 3x10"               OT Short Term Goals - 08/01/19 1310      OT SHORT TERM GOAL #1   Title  Patient and family will be educated and verbalize independence with HEP in order to increase functional use of his right hand while using for 25% or more of daily tasks.    Time  6    Period  Weeks    Status  On-going    Target Date  09/10/19      OT SHORT TERM GOAL #2   Title  Patient will increase scapular and proximal shoulder strengthening while demonstrating increased shoulder endurance during a sustained functional reaching task for more than 1 minute.    Time  6    Period  Weeks    Status  On-going      OT SHORT TERM GOAL #3   Title  Patient will increase gross motor coordination by moving 10 or more blocks during the blocks and box test allow him to complete self feeding tasks with less difficulty.    Time  6    Period  Weeks    Status  On-going  OT SHORT TERM  GOAL #4   Title  Patient will increase his right hand grip strength by 6# and his pinch strength by 3# in order to hold his utensil during meals without dropping or fatigue.    Time  6    Period  Weeks    Status  On-going      OT SHORT TERM GOAL #5   Title  Patient will be educated on adaptive eqipment/strategies that may help improve overall motor control when completing basic self care tasks.    Time  6    Period  Weeks    Status  On-going               Plan - 08/06/19 1154    Clinical Impression Statement  A: Continued to focus on right shoulder and scapular stability this session, limited due to severe pain in left hand limiting assistance and support from LUE. Pt able to complete 6-8 repetitions before needing to rest due to fatigue during exercises. During ball circles pt noted to have significantly more difficulty going to the right (counterclockwise) requiring increased tactile support and assist. Added isometrics at beginning of session with decrease in tremors and ataxic movements for a short time. Verbal, visual, and tactile cuing for form and technique.    Body Structure / Function / Physical Skills  ADL;UE functional use;FMC;GMC;Coordination;IADL;Dexterity;Strength    Plan  P: Continue with isometrics and add to HEP if pt completing well, continue with shoulder/scapular stability work    OT Home Exercise Plan  eval: stress ball for grip and pinch strengthening    Consulted and Agree with Plan of Care  Patient       Patient will benefit from skilled therapeutic intervention in order to improve the following deficits and impairments:   Body Structure / Function / Physical Skills: ADL, UE functional use, FMC, GMC, Coordination, IADL, Dexterity, Strength       Visit Diagnosis: Other lack of coordination  Other symptoms and signs involving the musculoskeletal system    Problem List Patient Active Problem List   Diagnosis Date Noted  . Sundowning 07/04/2019  .  Bilateral hearing loss 04/16/2019  . Urinary frequency 03/21/2019  . Chronic right shoulder pain 12/19/2018  . Weakness generalized 09/28/2018  . Palliative care encounter 09/28/2018  . Pressure injury of skin 08/27/2018  . COPD (chronic obstructive pulmonary disease) ([place]) 08/26/2018  . AMS (altered mental status) 08/22/2018  . Altered mental status 08/21/2018  . UTI (urinary tract infection) 08/21/2018  . Abnormality of gait 08/20/2018  . Prolonged Q-T interval on ECG   . Dysphagia, post-stroke   . Supplemental oxygen dependent   . Anxiety state   . Fatigue   . SOB (shortness of breath)   . Hyperglycemia   . History of lung cancer   . Depression   . CKD (chronic kidney disease), stage III   . Acute blood loss anemia   . Fall 07/16/2018  . 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 Acute spontaneous 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 ([place]) L thalamic 07/14/2018  . Cellulitis of arm, right 06/01/2018  . Rash and nonspecific skin eruption 06/01/2018  . Chest pain 02/02/2018  . Thrombocytopenia ([place]) 02/02/2018  . Hypokalemia 02/02/2018  . Pulmonary embolism ([place]) 02/02/2018  . Elevated troponin 02/02/2018  . Pulmonary embolus ([place]) 02/02/2018  . Non-ST elevation MI (NSTEMI) ([place]) 06/27/2017  . Diastolic CHF ([place]) 62/13/0865  . Chest pain due to CAD (  [place]) 06/26/2017  . Dyspnea 06/21/2017  . Acute bronchitis 05/15/2017  . Oral candidiasis 05/15/2017  . Acute pericarditis   . Chest pain at rest 12/20/2016  . Angina pectoris ([place]) 12/20/2016  . Radiation pneumonitis ([place]) 11/09/2016  . Pleurisy 10/11/2016  . Chest wall pain 10/11/2016  . Atrial fibrillation ([place]) 05/21/2016  . Adenocarcinoma of right lung ([place]) 01/06/2016  . GAD (generalized anxiety disorder) 11/20/2015  . Essential hypertension   . Cellulitis 09/04/2015  . Cellulitis of left axilla   . Esophageal ulcer without bleeding   .  Bilateral lower extremity edema 04/28/2014  . BPH (benign prostatic hyperplasia) 10/08/2012  . Anxiety   . EKG abnormalities 09/05/2012  . Tremor 09/05/2012  . Chronic fatigue 09/05/2012  . Arthritis   . Asthma, chronic   . Reflux esophagitis   . S/P CABG (coronary artery bypass graft)   . GERD (gastroesophageal reflux disease)   . Hyperlipidemia   . Hypertension   . UNSPECIFIED PERIPHERAL VASCULAR DISEASE 06/16/2009   Guadelupe Sabin, OTR/L  [phone] 08/06/2019, 12:01 PM  [facility] [address] [place], [place], [address] Phone: [phone]   Fax:  [phone]  Name: Evan [name] MRN: [id number] Date of Birth: [birth year]

## 2019-08-06 NOTE — Therapy (Signed)
[place], [place], [address] Phone: [phone]   Fax:  [phone]  Physical Therapy Treatment  Patient Details  Name: Evan [name] MRN: [id number] Date of Birth: [birth year] Referring Provider (PT): [name] MD    Encounter Date: 08/06/2019  PT End of Session - 08/06/19 0940    Visit Number  7    Number of Visits  12    Date for PT Re-Evaluation  08/23/19    Authorization Type  [employer]    Authorization Time Period  07/10/19-08/23/19    Authorization - Number of Visits  7    Progress Note Due on Visit  10    PT Start Time  0918    PT Stop Time  0956    PT Time Calculation (min)  38 min    Equipment Utilized During Treatment  Gait belt    Activity Tolerance  Patient limited by pain;Patient limited by fatigue    Behavior During Therapy  [facility] for tasks assessed/performed       Past Medical History:  Diagnosis Date  . Adenocarcinoma of right lung ([place]) 01/06/2016  . Anginal chest pain at rest [facility])    Chronic non, controlled on Lorazepam  . Anxiety   . Arthritis    "all over"   . BPH (benign prostatic hyperplasia)   . CAD (coronary artery disease)   . Chronic lower back pain   . Complication of anesthesia    "problems making water afterwards"  . Depression   . DJD (degenerative joint disease)   . Esophageal ulcer without bleeding    bid ppi indefinitely  . Family history of adverse reaction to anesthesia    "children w/PONV"  . GERD (gastroesophageal reflux disease)   . Headache    "couple/week maybe" (09/04/2015)  . Hemochromatosis    Possible, elevated iron stores, hook-like osteophytes on hand films, normal LFTs  . Hepatitis    "yellow jaundice as a baby"  . History of ASCVD    MULTIVESSEL  . Hyperlipidemia   . Hypertension   . Osteoarthritis   . Pneumonia    "several times; got a little now" (09/04/2015)  . Rheumatoid arthritis ([place])   . Subdural hematoma (HCC)    small after fall  09/2016-plavix held, neurosurgery consulted.  Asymptomatic.    Marland Kitchen 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[place]) 02/2018   on Lovenox lifelong since he failed PO Eliquis    Past Surgical History:  Procedure Laterality Date  . ARM SKIN LESION BIOPSY / EXCISION Left 09/02/2015  . CATARACT EXTRACTION W/ INTRAOCULAR LENS  IMPLANT, BILATERAL Bilateral   . CHOLECYSTECTOMY OPEN    . CORNEAL TRANSPLANT Bilateral    "one at [facility]; one at [facility]"  . CORONARY ANGIOPLASTY WITH STENT PLACEMENT    . CORONARY ARTERY BYPASS GRAFT  1999  . DESCENDING AORTIC ANEURYSM REPAIR W/ STENT     DE stent ostium into the right radial free graft at OM-, 02-2007  . ESOPHAGOGASTRODUODENOSCOPY N/A 11/09/2014   Procedure: ESOPHAGOGASTRODUODENOSCOPY (EGD);  Surgeon: [name], MD;  Location: [facility] ENDOSCOPY;  Service: Endoscopy;  Laterality: N/A;  . INGUINAL HERNIA REPAIR    . JOINT REPLACEMENT    . LEFT HEART CATH AND CORS/GRAFTS ANGIOGRAPHY N/A 06/27/2017   Procedure: LEFT HEART CATH AND CORS/GRAFTS ANGIOGRAPHY;  Surgeon: [name], MD;  Location: [place] CV LAB;  Service: Cardiovascular;  Laterality: N/A;  . NASAL SINUS SURGERY    . SHOULDER OPEN ROTATOR CUFF REPAIR Right   .  TOTAL KNEE ARTHROPLASTY Bilateral     There were no vitals filed for this visit.  Subjective Assessment - 08/06/19 0938    Subjective  Pt arrived with wife, reports arthritis in index finger joint wiht increased pain today 10/10 with movement.  Reports prednisone pills helped with pain though reports pain came right back when finished.    Pertinent History  CVA effecting RT side on 07/13/18, lumbar fracture December 2021    Patient Stated Goals  Be able to walk    Currently in Pain?  Yes    Pain Score  10-Worst pain ever    Pain Location  Finger (Comment which one)   index   Pain Orientation  Left    Pain Descriptors / Indicators  Aching;Dull;Sore    Aggravating Factors   movement    Pain Relieving Factors  rest                       [facility]  Adult PT Treatment/Exercise - 08/06/19 0001      Ambulation/Gait   Ambulation/Gait  Yes    Ambulation/Gait Assistance  4: Min guard    Ambulation Distance (Feet)  94 Feet   4 segments with seated rest break between    Assistive device  Rolling walker    Gait Pattern  Step-to pattern;Decreased step length - right;Decreased step length - left;Decreased stance time - right;Decreased stride length;Decreased dorsiflexion - right;Ataxic;Trunk flexed    Ambulation Surface  Level;Indoor      Knee/Hip Exercises: Standing   Heel Raises  Both;10 reps    Heel Raises Limitations  Rt foot inverted    Other Standing Knee Exercises  staggered stance, solid floor, 2 x 10" each with CG      Other Standing Knee Exercises  toe tapping 10x alternating on 4in step with BLE               PT Short Term Goals - 07/10/19 0950      PT SHORT TERM GOAL #1   Title  Patient will be independent with initial HEP to improve functional outcomes    Time  3    Period  Weeks    Status  New    Target Date  08/02/19      PT SHORT TERM GOAL #2   Title  Patient will be able to perform stand x 5 in < 40 seconds to demonstrate improvement in functional mobility and reduced risk for falls.    Time  3    Period  Weeks    Status  New    Target Date  08/02/19        PT Long Term Goals - 07/10/19 0951      PT LONG TERM GOAL #1   Title  Patient will be able to perform stand x 5 in < 25 seconds to demonstrate improvement in functional mobility and reduced risk for falls.    Time  6    Period  Weeks    Status  New    Target Date  08/23/19      PT LONG TERM GOAL #2   Title  Patient will be able to perform TUG in <15 seconds, using least restrictive assistive device, to demonstrate improvement in functional mobility and reduced risk for falls.    Time  6    Period  Weeks    Status  New    Target Date  08/23/19      PT  LONG TERM GOAL #3   Title  Patient will have equal to or > 4/5 MMT throughout BLE to  improve ability to perform functional mobility, stair ambulation and ADLs.    Time  6    Period  Weeks    Status  New    Target Date  08/23/19            Plan - 08/06/19 0958    Clinical Impression Statement  Pt limited with hand pain and fatigue wiht activity.  Pt presents with improved awareness of safety with decreasead cueing required with hand placement prior rest breaks.  Pt continues to demonstrate ankle instability during gait with Rt foot inverted.  Wife stated they have discussed ankle orthortic with MD, plans on trying it out.    Personal Factors and Comorbidities  Age;Comorbidity 2    Comorbidities  HX ov CVA, lumbar fracture    Examination-Activity Limitations  Bathing;Bed Mobility;Bend;Squat;Stairs;Carry;Stand;Toileting;Dressing;Transfers;Hygiene/Grooming;Lift;Locomotion Level    Examination-Participation Restrictions  Community Activity;Other;Cleaning    Stability/Clinical Decision Making  Stable/Uncomplicated    Clinical Decision Making  Low    Rehab Potential  Fair    PT Frequency  2x / week    PT Duration  6 weeks    PT Treatment/Interventions  ADLs/Self Care Home Management;Aquatic Therapy;Biofeedback;Cryotherapy;Electrical Stimulation;Iontophoresis 4mg /ml Dexamethasone;Moist Heat;Traction;Balance training;Manual techniques;Therapeutic exercise;Vasopneumatic Device;Wheelchair mobility training;Therapeutic activities;Splinting;Taping;Functional mobility training;Stair training;Orthotic Fit/Training;Gait training;DME Instruction;Patient/family education;Passive range of motion;Dry needling;Energy conservation;Other (comment);Joint Manipulations;Spinal Manipulations;Compression bandaging;Neuromuscular re-education;Cognitive remediation;Ultrasound;Parrafin;Fluidtherapy;Contrast Bath    PT Next Visit Plan  continue with static balance and gait progression as tolerated.    PT Home Exercise Plan  07/10/19: seated march, LAQs, seated heel/toe raise; 07/15/19: unsupported sitting 1-2  min rounds       Patient will benefit from skilled therapeutic intervention in order to improve the following deficits and impairments:  Abnormal gait, Decreased endurance, Hypomobility, Impaired sensation, Decreased activity tolerance, Decreased strength, Impaired UE functional use, Decreased balance, Decreased mobility, Difficulty walking, Decreased coordination, Decreased safety awareness, Postural dysfunction  Visit Diagnosis: Other abnormalities of gait and mobility  Muscle weakness (generalized)     Problem List Patient Active Problem List   Diagnosis Date Noted  . Sundowning 07/04/2019  . Bilateral hearing loss 04/16/2019  . Urinary frequency 03/21/2019  . Chronic right shoulder pain 12/19/2018  . Weakness generalized 09/28/2018  . Palliative care encounter 09/28/2018  . Pressure injury of skin 08/27/2018  . COPD (chronic obstructive pulmonary disease) ([place]) 08/26/2018  . AMS (altered mental status) 08/22/2018  . Altered mental status 08/21/2018  . UTI (urinary tract infection) 08/21/2018  . Abnormality of gait 08/20/2018  . Prolonged Q-T interval on ECG   . Dysphagia, post-stroke   . Supplemental oxygen dependent   . Anxiety state   . Fatigue   . SOB (shortness of breath)   . Hyperglycemia   . History of lung cancer   . Depression   . CKD (chronic kidney disease), stage III   . Acute blood loss anemia   . Fall 07/16/2018  . 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 Acute spontaneous 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 ([place]) L thalamic 07/14/2018  . Cellulitis of arm, right 06/01/2018  . Rash and nonspecific skin eruption 06/01/2018  . Chest pain 02/02/2018  . Thrombocytopenia ([place]) 02/02/2018  . Hypokalemia 02/02/2018  . Pulmonary embolism ([place]) 02/02/2018  . Elevated troponin 02/02/2018  . Pulmonary embolus ([place]) 02/02/2018  . Non-ST elevation MI (NSTEMI) ([place]) 06/27/2017  . Diastolic  CHF (  [place]) 06/26/2017  . Chest pain due to CAD ([place]) 06/26/2017  . Dyspnea 06/21/2017  . Acute bronchitis 05/15/2017  . Oral candidiasis 05/15/2017  . Acute pericarditis   . Chest pain at rest 12/20/2016  . Angina pectoris ([place]) 12/20/2016  . Radiation pneumonitis ([place]) 11/09/2016  . Pleurisy 10/11/2016  . Chest wall pain 10/11/2016  . Atrial fibrillation ([place]) 05/21/2016  . Adenocarcinoma of right lung ([place]) 01/06/2016  . GAD (generalized anxiety disorder) 11/20/2015  . Essential hypertension   . Cellulitis 09/04/2015  . Cellulitis of left axilla   . Esophageal ulcer without bleeding   . Bilateral lower extremity edema 04/28/2014  . BPH (benign prostatic hyperplasia) 10/08/2012  . Anxiety   . EKG abnormalities 09/05/2012  . Tremor 09/05/2012  . Chronic fatigue 09/05/2012  . Arthritis   . Asthma, chronic   . Reflux esophagitis   . S/P CABG (coronary artery bypass graft)   . GERD (gastroesophageal reflux disease)   . Hyperlipidemia   . Hypertension   . UNSPECIFIED PERIPHERAL VASCULAR DISEASE 06/16/2009   Ihor Austin, LPTA/CLT; CBIS [phone]   Aldona Lento 08/06/2019, 10:36 AM  [facility] [address] [place], [place], [address] Phone: [phone]   Fax:  [phone]  Name: Evan [name] MRN: [id number] Date of Birth: [birth year]

## 2019-08-08 ENCOUNTER — Other Ambulatory Visit: Payer: Self-pay

## 2019-08-08 ENCOUNTER — Encounter (HOSPITAL_COMMUNITY): Payer: Self-pay

## 2019-08-08 ENCOUNTER — Ambulatory Visit (HOSPITAL_COMMUNITY): Payer: Medicare HMO | Admitting: Physical Therapy

## 2019-08-08 ENCOUNTER — Ambulatory Visit (HOSPITAL_COMMUNITY): Payer: Medicare HMO

## 2019-08-08 ENCOUNTER — Encounter (HOSPITAL_COMMUNITY): Payer: Self-pay | Admitting: Physical Therapy

## 2019-08-08 DIAGNOSIS — R29898 Other symptoms and signs involving the musculoskeletal system: Secondary | ICD-10-CM

## 2019-08-08 DIAGNOSIS — R2689 Other abnormalities of gait and mobility: Secondary | ICD-10-CM | POA: Diagnosis not present

## 2019-08-08 DIAGNOSIS — R278 Other lack of coordination: Secondary | ICD-10-CM

## 2019-08-08 DIAGNOSIS — M6281 Muscle weakness (generalized): Secondary | ICD-10-CM

## 2019-08-08 NOTE — Therapy (Signed)
[place], [place], [address] Phone: [phone]   Fax:  [phone]  Physical Therapy Treatment  Patient Details  Name: Evan [name] MRN: [id number] Date of Birth: [birth year] Referring Provider (PT): [name] MD    Encounter Date: 08/08/2019  PT End of Session - 08/08/19 0903    Visit Number  8    Number of Visits  12    Date for PT Re-Evaluation  08/23/19    Authorization Type  [employer]    Authorization Time Period  07/10/19-08/23/19    Authorization - Number of Visits  8    Progress Note Due on Visit  10    PT Start Time  0900    PT Stop Time  0940    PT Time Calculation (min)  40 min    Equipment Utilized During Treatment  Gait belt    Activity Tolerance  Patient limited by pain;Patient limited by fatigue    Behavior During Therapy  [facility] for tasks assessed/performed       Past Medical History:  Diagnosis Date  . Adenocarcinoma of right lung ([place]) 01/06/2016  . Anginal chest pain at rest [facility])    Chronic non, controlled on Lorazepam  . Anxiety   . Arthritis    "all over"   . BPH (benign prostatic hyperplasia)   . CAD (coronary artery disease)   . Chronic lower back pain   . Complication of anesthesia    "problems making water afterwards"  . Depression   . DJD (degenerative joint disease)   . Esophageal ulcer without bleeding    bid ppi indefinitely  . Family history of adverse reaction to anesthesia    "children w/PONV"  . GERD (gastroesophageal reflux disease)   . Headache    "couple/week maybe" (09/04/2015)  . Hemochromatosis    Possible, elevated iron stores, hook-like osteophytes on hand films, normal LFTs  . Hepatitis    "yellow jaundice as a baby"  . History of ASCVD    MULTIVESSEL  . Hyperlipidemia   . Hypertension   . Osteoarthritis   . Pneumonia    "several times; got a little now" (09/04/2015)  . Rheumatoid arthritis ([place])   . Subdural hematoma (H[facility])    small after fall  09/2016-plavix held, neurosurgery consulted.  Asymptomatic.    Marland Kitchen 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[place]) 02/2018   on Lovenox lifelong since he failed PO Eliquis    Past Surgical History:  Procedure Laterality Date  . ARM SKIN LESION BIOPSY / EXCISION Left 09/02/2015  . CATARACT EXTRACTION W/ INTRAOCULAR LENS  IMPLANT, BILATERAL Bilateral   . CHOLECYSTECTOMY OPEN    . CORNEAL TRANSPLANT Bilateral    "one at [facility]; one at [facility]"  . CORONARY ANGIOPLASTY WITH STENT PLACEMENT    . CORONARY ARTERY BYPASS GRAFT  1999  . DESCENDING AORTIC ANEURYSM REPAIR W/ STENT     DE stent ostium into the right radial free graft at OM-, 02-2007  . ESOPHAGOGASTRODUODENOSCOPY N/A 11/09/2014   Procedure: ESOPHAGOGASTRODUODENOSCOPY (EGD);  Surgeon: [name], MD;  Location: [facility] ENDOSCOPY;  Service: Endoscopy;  Laterality: N/A;  . INGUINAL HERNIA REPAIR    . JOINT REPLACEMENT    . LEFT HEART CATH AND CORS/GRAFTS ANGIOGRAPHY N/A 06/27/2017   Procedure: LEFT HEART CATH AND CORS/GRAFTS ANGIOGRAPHY;  Surgeon: [name], MD;  Location: [place] CV LAB;  Service: Cardiovascular;  Laterality: N/A;  . NASAL SINUS SURGERY    . SHOULDER OPEN ROTATOR CUFF REPAIR Right   .  TOTAL KNEE ARTHROPLASTY Bilateral     There were no vitals filed for this visit.  Subjective Assessment - 08/08/19 0902    Subjective  Patients daughter says he has arthritis in his hand. Patein says his legs dont hurt if he isnt moving them. Report no new issues otherwise.    Patient is accompained by:  Family member    Pertinent History  CVA effecting RT side on 07/13/18, lumbar fracture December 2021    Patient Stated Goals  Be able to walk    Currently in Pain?  No/denies                       [facility] Adult PT Treatment/Exercise - 08/08/19 0910      Knee/Hip Exercises: Standing   Gait Training  105 feet with rest break x 3 (50 feet max) with RW and cues for RT foot flat     Other Standing Knee Exercises  staggered stance, solid floor, 1 x 15"  RT foot fwd; standing balance to tolerance 30 sec unsupported    Other Standing Knee Exercises  toe tapping 10x alternating on 4in step with BLE      Knee/Hip Exercises: Seated   Long Arc Quad  Both;1 set;10 reps    Long Arc Quad Weight  1 lbs.    Marching  Both;1 set;10 reps    Marching Limitations  1    Sit to [employer]  5 reps;with UE support               PT Short Term Goals - 07/10/19 0950      PT SHORT TERM GOAL #1   Title  Patient will be independent with initial HEP to improve functional outcomes    Time  3    Period  Weeks    Status  New    Target Date  08/02/19      PT SHORT TERM GOAL #2   Title  Patient will be able to perform stand x 5 in < 40 seconds to demonstrate improvement in functional mobility and reduced risk for falls.    Time  3    Period  Weeks    Status  New    Target Date  08/02/19        PT Long Term Goals - 07/10/19 0951      PT LONG TERM GOAL #1   Title  Patient will be able to perform stand x 5 in < 25 seconds to demonstrate improvement in functional mobility and reduced risk for falls.    Time  6    Period  Weeks    Status  New    Target Date  08/23/19      PT LONG TERM GOAL #2   Title  Patient will be able to perform TUG in <15 seconds, using least restrictive assistive device, to demonstrate improvement in functional mobility and reduced risk for falls.    Time  6    Period  Weeks    Status  New    Target Date  08/23/19      PT LONG TERM GOAL #3   Title  Patient will have equal to or > 4/5 MMT throughout BLE to improve ability to perform functional mobility, stair ambulation and ADLs.    Time  6    Period  Weeks    Status  New    Target Date  08/23/19  Plan - 08/08/19 0956    Clinical Impression Statement  Patient continues to be limited largely by fatigue with activity. Patient requires continued verbal cueing for positioning and hand placement for sit to stands. Patient with ongoing tendency to invert RT foot  with during gait training and during step ups, but is able to correct with verbal and tactile cueing. Patient is showing improvement in distance ambulated and standing tolerance, and was able to increase both at todays session. Patient requires frequent breaks for rest due to increased fatigue throughout session. Patient will continue to benefit from skilled therapy services to progress tolerance to functional activity and strengthening to improve functional mobility.    Personal Factors and Comorbidities  Age;Comorbidity 2    Comorbidities  HX ov CVA, lumbar fracture    Examination-Activity Limitations  Bathing;Bed Mobility;Bend;Squat;Stairs;Carry;Stand;Toileting;Dressing;Transfers;Hygiene/Grooming;Lift;Locomotion Level    Examination-Participation Restrictions  Community Activity;Other;Cleaning    Stability/Clinical Decision Making  Stable/Uncomplicated    Rehab Potential  Fair    PT Frequency  2x / week    PT Duration  6 weeks    PT Treatment/Interventions  ADLs/Self Care Home Management;Aquatic Therapy;Biofeedback;Cryotherapy;Electrical Stimulation;Iontophoresis 4mg /ml Dexamethasone;Moist Heat;Traction;Balance training;Manual techniques;Therapeutic exercise;Vasopneumatic Device;Wheelchair mobility training;Therapeutic activities;Splinting;Taping;Functional mobility training;Stair training;Orthotic Fit/Training;Gait training;DME Instruction;Patient/family education;Passive range of motion;Dry needling;Energy conservation;Other (comment);Joint Manipulations;Spinal Manipulations;Compression bandaging;Neuromuscular re-education;Cognitive remediation;Ultrasound;Parrafin;Fluidtherapy;Contrast Bath    PT Next Visit Plan  continue with static balance and gait progression as tolerated.    PT Home Exercise Plan  07/10/19: seated march, LAQs, seated heel/toe raise; 07/15/19: unsupported sitting 1-2 min rounds    Consulted and Agree with Plan of Care  Patient;Family member/caregiver    Family Member Consulted   Daughter       Patient will benefit from skilled therapeutic intervention in order to improve the following deficits and impairments:  Abnormal gait, Decreased endurance, Hypomobility, Impaired sensation, Decreased activity tolerance, Decreased strength, Impaired UE functional use, Decreased balance, Decreased mobility, Difficulty walking, Decreased coordination, Decreased safety awareness, Postural dysfunction  Visit Diagnosis: Other abnormalities of gait and mobility  Muscle weakness (generalized)     Problem List Patient Active Problem List   Diagnosis Date Noted  . Sundowning 07/04/2019  . Bilateral hearing loss 04/16/2019  . Urinary frequency 03/21/2019  . Chronic right shoulder pain 12/19/2018  . Weakness generalized 09/28/2018  . Palliative care encounter 09/28/2018  . Pressure injury of skin 08/27/2018  . COPD (chronic obstructive pulmonary disease) ([place]) 08/26/2018  . AMS (altered mental status) 08/22/2018  . Altered mental status 08/21/2018  . UTI (urinary tract infection) 08/21/2018  . Abnormality of gait 08/20/2018  . Prolonged Q-T interval on ECG   . Dysphagia, post-stroke   . Supplemental oxygen dependent   . Anxiety state   . Fatigue   . SOB (shortness of breath)   . Hyperglycemia   . History of lung cancer   . Depression   . CKD (chronic kidney disease), stage III   . Acute blood loss anemia   . Fall 07/16/2018  . 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 Acute spontaneous 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 ([place]) L thalamic 07/14/2018  . Cellulitis of arm, right 06/01/2018  . Rash and nonspecific skin eruption 06/01/2018  . Chest pain 02/02/2018  . Thrombocytopenia ([place]) 02/02/2018  . Hypokalemia 02/02/2018  . Pulmonary embolism ([place]) 02/02/2018  . Elevated troponin 02/02/2018  . Pulmonary embolus ([place]) 02/02/2018  . Non-ST elevation MI (NSTEMI) ([place]) 06/27/2017  . Diastolic  CHF ([place]) 62/95/2841  . Chest pain  due to CAD ([place]) 06/26/2017  . Dyspnea 06/21/2017  . Acute bronchitis 05/15/2017  . Oral candidiasis 05/15/2017  . Acute pericarditis   . Chest pain at rest 12/20/2016  . Angina pectoris ([place]) 12/20/2016  . Radiation pneumonitis ([place]) 11/09/2016  . Pleurisy 10/11/2016  . Chest wall pain 10/11/2016  . Atrial fibrillation ([place]) 05/21/2016  . Adenocarcinoma of right lung ([place]) 01/06/2016  . GAD (generalized anxiety disorder) 11/20/2015  . Essential hypertension   . Cellulitis 09/04/2015  . Cellulitis of left axilla   . Esophageal ulcer without bleeding   . Bilateral lower extremity edema 04/28/2014  . BPH (benign prostatic hyperplasia) 10/08/2012  . Anxiety   . EKG abnormalities 09/05/2012  . Tremor 09/05/2012  . Chronic fatigue 09/05/2012  . Arthritis   . Asthma, chronic   . Reflux esophagitis   . S/P CABG (coronary artery bypass graft)   . GERD (gastroesophageal reflux disease)   . Hyperlipidemia   . Hypertension   . UNSPECIFIED PERIPHERAL VASCULAR DISEASE 06/16/2009    9:59 AM, 08/08/19 Josue Hector PT DPT  Physical Therapist with [facility]  [phone] [place] [address] [place], [place], [address] Phone: [phone]   Fax:  [phone]  Name: Evan [name] MRN: [id number] Date of Birth: 1927-08-27

## 2019-08-08 NOTE — Therapy (Signed)
[facility] [place], [place], [address] Phone: [phone]   Fax:  [phone]  Occupational Therapy Treatment  Patient Details  Name: Evan [name] MRN: [id number] Date of Birth: 12-17-1927 Referring Provider (OT): [name]   Encounter Date: 08/08/2019  OT End of Session - 08/08/19 1125    Visit Number  4    Number of Visits  12    Date for OT Re-Evaluation  09/10/19    Authorization Type  [employer] H[employer]    Authorization Time Period  12 visits approved (07/30/19-09/10/19) $40 copay    Authorization - Visit Number  4    Authorization - Number of Visits  12    Progress Note Due on Visit  10    OT Start Time  0818    OT Stop Time  0853    OT Time Calculation (min)  35 min    Activity Tolerance  Patient tolerated treatment well    Behavior During Therapy  [facility] for tasks assessed/performed       Past Medical History:  Diagnosis Date  . Adenocarcinoma of right lung ([place]) 01/06/2016  . Anginal chest pain at rest [facility]    Chronic non, controlled on Lorazepam  . Anxiety   . Arthritis    "all over"   . BPH (benign prostatic hyperplasia)   . CAD (coronary artery disease)   . Chronic lower back pain   . Complication of anesthesia    "problems making water afterwards"  . Depression   . DJD (degenerative joint disease)   . Esophageal ulcer without bleeding    bid ppi indefinitely  . Family history of adverse reaction to anesthesia    "children w/PONV"  . GERD (gastroesophageal reflux disease)   . Headache    "couple/week maybe" (09/04/2015)  . Hemochromatosis    Possible, elevated iron stores, hook-like osteophytes on hand films, normal LFTs  . Hepatitis    "yellow jaundice as a baby"  . History of ASCVD    MULTIVESSEL  . Hyperlipidemia   . Hypertension   . Osteoarthritis   . Pneumonia    "several times; got a little now" (09/04/2015)  . Rheumatoid arthritis ([place])   . Subdural hematoma (H[facility])    small  after fall 09/2016-plavix held, neurosurgery consulted.  Asymptomatic.    Marland Kitchen 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[place]) 02/2018   on Lovenox lifelong since he failed PO Eliquis    Past Surgical History:  Procedure Laterality Date  . ARM SKIN LESION BIOPSY / EXCISION Left 09/02/2015  . CATARACT EXTRACTION W/ INTRAOCULAR LENS  IMPLANT, BILATERAL Bilateral   . CHOLECYSTECTOMY OPEN    . CORNEAL TRANSPLANT Bilateral    "one at [facility]; one at [facility]"  . CORONARY ANGIOPLASTY WITH STENT PLACEMENT    . CORONARY ARTERY BYPASS GRAFT  1999  . DESCENDING AORTIC ANEURYSM REPAIR W/ STENT     DE stent ostium into the right radial free graft at OM-, 02-2007  . ESOPHAGOGASTRODUODENOSCOPY N/A 11/09/2014   Procedure: ESOPHAGOGASTRODUODENOSCOPY (EGD);  Surgeon: [name], MD;  Location: [facility] ENDOSCOPY;  Service: Endoscopy;  Laterality: N/A;  . INGUINAL HERNIA REPAIR    . JOINT REPLACEMENT    . LEFT HEART CATH AND CORS/GRAFTS ANGIOGRAPHY N/A 06/27/2017   Procedure: LEFT HEART CATH AND CORS/GRAFTS ANGIOGRAPHY;  Surgeon: [name], MD;  Location: [place] CV LAB;  Service: Cardiovascular;  Laterality: N/A;  . NASAL SINUS SURGERY    . SHOULDER OPEN ROTATOR CUFF REPAIR  Right   . TOTAL KNEE ARTHROPLASTY Bilateral     There were no vitals filed for this visit.  Subjective Assessment - 08/08/19 0822    Subjective   S:My finger hurts if I move it.    Patient is accompanied by:  Family member   [name]: Daughter   Currently in Pain?  Yes    Pain Score  6    10/10 with movement   Pain Location  Finger (Comment which one)   index MCP joint   Pain Orientation  Left    Pain Descriptors / Indicators  Aching;Sore;Dull    Pain Radiating Towards  N/A    Pain Onset  In the past 7 days    Pain Frequency  Constant         [facility] OT Assessment - 08/08/19 0828      Assessment   Medical Diagnosis  right hand weakness      Precautions   Precautions  Fall               OT Treatments/Exercises (OP) - 08/08/19 8366       Exercises   Exercises  Shoulder      Shoulder Exercises: Isometric Strengthening   Flexion  --   3x10"   Extension  --   3x10"   ABduction  --   3x10"   Other Isometric Exercises  elbow flexion/extension, seated 3x10"      Neurological Re-education Exercises   Other Exercises 2  Gross motor activity with 1# wrist weight on RUE while removing 8 large pegs from board. Pegs place at varrious distances. Therapist provided min tactile cueing to right elbow to remain on table resting versus lifting up entire arm. Completed several trials.     Other Grasp and Release Exercises   Using 1# weight, patient placed and removed tennis ball from various heights with the highest at shoulder level. Increased difficulty to control RUE when lifted off the table.              OT Education - 08/08/19 1123    Education Details  Education provided throughout session while trialing 1 and 2lb wrist weight during functional activities. Pt with less control using 1# although fatigued quickly and was unable to complete tasks using 2lb wrist weight. Encouraged patient to complete his HEP one extra time than he did previously. Discussed the importance of seated positioning and height of table and chair to allow patient to rest his elbow on the table for maximum performance.    Person(s) Educated  Patient;Child(ren)    Methods  Explanation;Demonstration    Comprehension  Verbalized understanding       OT Short Term Goals - 08/01/19 1310      OT SHORT TERM GOAL #1   Title  Patient and family will be educated and verbalize independence with HEP in order to increase functional use of his right hand while using for 25% or more of daily tasks.    Time  6    Period  Weeks    Status  On-going    Target Date  09/10/19      OT SHORT TERM GOAL #2   Title  Patient will increase scapular and proximal shoulder strengthening while demonstrating increased shoulder endurance during a sustained functional reaching task  for more than 1 minute.    Time  6    Period  Weeks    Status  On-going      OT SHORT TERM  GOAL #3   Title  Patient will increase gross motor coordination by moving 10 or more blocks during the blocks and box test allow him to complete self feeding tasks with less difficulty.    Time  6    Period  Weeks    Status  On-going      OT SHORT TERM GOAL #4   Title  Patient will increase his right hand grip strength by 6# and his pinch strength by 3# in order to hold his utensil during meals without dropping or fatigue.    Time  6    Period  Weeks    Status  On-going      OT SHORT TERM GOAL #5   Title  Patient will be educated on adaptive eqipment/strategies that may help improve overall motor control when completing basic self care tasks.    Time  6    Period  Weeks    Status  On-going               Plan - 08/08/19 1132    Clinical Impression Statement  A: trialed use of 1lb wrist weight and 2lb wrist weight with patient demonstrating slightly less apraxic movements using 2lb. Although this slight improvement was seen, the use of 2lb was not seen as benefitual as patient quickly fatigued and could not tolerate weight to participate in therapy activities. Began education on use of compensatory strategy of placing and resting elbow on table while completing simple coordination task to increase control. patient demonstrated increased speed while removing speed. He did require verbal and light tactile cueing to keep elbow resting on table.    Body Structure / Function / Physical Skills  ADL;UE functional use;FMC;GMC;Coordination;IADL;Dexterity;Strength    Plan  P: Continue work on increasing motor control with compensatory strategy of placing elbow on table to decrease demand on shoulder itself. Follow up on use of strategy at home.    Consulted and Agree with Plan of Care  Patient;Family member/caregiver    Family Member Consulted  Daughter; [name]       Patient will benefit from  skilled therapeutic intervention in order to improve the following deficits and impairments:   Body Structure / Function / Physical Skills: ADL, UE functional use, FMC, GMC, Coordination, IADL, Dexterity, Strength       Visit Diagnosis: Other lack of coordination  Other symptoms and signs involving the musculoskeletal system    Problem List Patient Active Problem List   Diagnosis Date Noted  . Sundowning 07/04/2019  . Bilateral hearing loss 04/16/2019  . Urinary frequency 03/21/2019  . Chronic right shoulder pain 12/19/2018  . Weakness generalized 09/28/2018  . Palliative care encounter 09/28/2018  . Pressure injury of skin 08/27/2018  . COPD (chronic obstructive pulmonary disease) ([place]) 08/26/2018  . AMS (altered mental status) 08/22/2018  . Altered mental status 08/21/2018  . UTI (urinary tract infection) 08/21/2018  . Abnormality of gait 08/20/2018  . Prolonged Q-T interval on ECG   . Dysphagia, post-stroke   . Supplemental oxygen dependent   . Anxiety state   . Fatigue   . SOB (shortness of breath)   . Hyperglycemia   . History of lung cancer   . Depression   . CKD (chronic kidney disease), stage III   . Acute blood loss anemia   . Fall 07/16/2018  . 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 Acute spontaneous 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 ([place]) L thalamic 07/14/2018  .  Cellulitis of arm, right 06/01/2018  . Rash and nonspecific skin eruption 06/01/2018  . Chest pain 02/02/2018  . Thrombocytopenia ([place]) 02/02/2018  . Hypokalemia 02/02/2018  . Pulmonary embolism ([place]) 02/02/2018  . Elevated troponin 02/02/2018  . Pulmonary embolus ([place]) 02/02/2018  . Non-ST elevation MI (NSTEMI) ([place]) 06/27/2017  . Diastolic CHF ([place]) 78/93/8101  . Chest pain due to CAD ([place]) 06/26/2017  . Dyspnea 06/21/2017  . Acute bronchitis 05/15/2017  . Oral candidiasis 05/15/2017  . Acute pericarditis   .  Chest pain at rest 12/20/2016  . Angina pectoris ([place]) 12/20/2016  . Radiation pneumonitis ([place]) 11/09/2016  . Pleurisy 10/11/2016  . Chest wall pain 10/11/2016  . Atrial fibrillation ([place]) 05/21/2016  . Adenocarcinoma of right lung ([name]) 01/06/2016  . GAD (generalized anxiety disorder) 11/20/2015  . Essential hypertension   . Cellulitis 09/04/2015  . Cellulitis of left axilla   . Esophageal ulcer without bleeding   . Bilateral lower extremity edema 04/28/2014  . BPH (benign prostatic hyperplasia) 10/08/2012  . Anxiety   . EKG abnormalities 09/05/2012  . Tremor 09/05/2012  . Chronic fatigue 09/05/2012  . Arthritis   . Asthma, chronic   . Reflux esophagitis   . S/P CABG (coronary artery bypass graft)   . GERD (gastroesophageal reflux disease)   . Hyperlipidemia   . Hypertension   . UNSPECIFIED PERIPHERAL VASCULAR DISEASE 06/16/2009   Ailene Ravel, OTR/L,CBIS  [phone]  08/08/2019, 11:45 AM  [facility] [address] [place], [place], [address] Phone: [phone]   Fax:  [phone]  Name: Evan [name] MRN: [id number] Date of Birth: November 25, 1927

## 2019-08-09 ENCOUNTER — Encounter: Payer: Self-pay | Admitting: Family Medicine

## 2019-08-09 ENCOUNTER — Ambulatory Visit (INDEPENDENT_AMBULATORY_CARE_PROVIDER_SITE_OTHER): Payer: Medicare HMO | Admitting: Family Medicine

## 2019-08-09 VITALS — BP 98/62 | HR 74 | Temp 97.3°F | Resp 16 | Ht 68.0 in | Wt 169.0 lb

## 2019-08-09 DIAGNOSIS — M1000 Idiopathic gout, unspecified site: Secondary | ICD-10-CM

## 2019-08-09 MED ORDER — PREDNISONE 20 MG PO TABS: ORAL_TABLET | ORAL | 0 refills | Status: DC

## 2019-08-09 MED ORDER — COLCHICINE 0.6 MG PO TABS: 0.6000 mg | ORAL_TABLET | Freq: Every day | ORAL | 0 refills | Status: DC | PRN

## 2019-08-09 NOTE — Progress Notes (Signed)
Subjective:    Patient ID: Evan [name], male    DOB: [birth year], 86 y.o.   MRN: [id number]  HPI Patient is a very pleasant 86 year old Caucasian male who is here today with his daughter who is his primary caregiver.  Patient suffered a hemorrhagic stroke and has right-sided hemiparesis.  He has diminished sensation in his right leg.  He is unable to feel vibration, pain, or cold in his right leg.  He also has ataxia in his right arm and right leg.  He has chronic hemiparesis in the right leg due to the stroke.  Patient is on chronic anticoagulation with Xarelto.  Therefore he is unable to take NSAIDs.  Recently he developed the sudden onset of pain, erythema, and swelling in his left third MCP and left second MCP joint.  They are swollen today on exam, warm to the touch.  Gentle touch of the joint elicits pain.  He is unable to move his hands due to pain.  He appears to have a gout exacerbation.  He occasionally gets gout although he has not had a flare recently. Past Medical History:  Diagnosis Date  . Adenocarcinoma of right lung ([place]) 01/06/2016  . Anginal chest pain at rest [facility])    Chronic non, controlled on Lorazepam  . Anxiety   . Arthritis    "all over"   . BPH (benign prostatic hyperplasia)   . CAD (coronary artery disease)   . Chronic lower back pain   . Complication of anesthesia    "problems making water afterwards"  . Depression   . DJD (degenerative joint disease)   . Esophageal ulcer without bleeding    bid ppi indefinitely  . Family history of adverse reaction to anesthesia    "children w/PONV"  . GERD (gastroesophageal reflux disease)   . Headache    "couple/week maybe" (09/04/2015)  . Hemochromatosis    Possible, elevated iron stores, hook-like osteophytes on hand films, normal LFTs  . Hepatitis    "yellow jaundice as a baby"  . History of ASCVD    MULTIVESSEL  . Hyperlipidemia   . Hypertension   . Osteoarthritis   . Pneumonia    "several times; got a little  now" (09/04/2015)  . Rheumatoid arthritis ([place])   . Subdural hematoma ([facility])    small after fall 09/2016-plavix held, neurosurgery consulted.  Asymptomatic.    Marland Kitchen 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[place]) 02/2018   on Lovenox lifelong since he failed PO Eliquis   Past Surgical History:  Procedure Laterality Date  . ARM SKIN LESION BIOPSY / EXCISION Left 09/02/2015  . CATARACT EXTRACTION W/ INTRAOCULAR LENS  IMPLANT, BILATERAL Bilateral   . CHOLECYSTECTOMY OPEN    . CORNEAL TRANSPLANT Bilateral    "one at [facility]; one [facility]"  . CORONARY ANGIOPLASTY WITH STENT PLACEMENT    . CORONARY ARTERY BYPASS GRAFT  1999  . DESCENDING AORTIC ANEURYSM REPAIR W/ STENT     DE stent ostium into the right radial free graft at OM-, 02-2007  . ESOPHAGOGASTRODUODENOSCOPY N/A 11/09/2014   Procedure: ESOPHAGOGASTRODUODENOSCOPY (EGD);  Surgeon: [name], MD;  Location: [facility] ENDOSCOPY;  Service: Endoscopy;  Laterality: N/A;  . INGUINAL HERNIA REPAIR    . JOINT REPLACEMENT    . LEFT HEART CATH AND CORS/GRAFTS ANGIOGRAPHY N/A 06/27/2017   Procedure: LEFT HEART CATH AND CORS/GRAFTS ANGIOGRAPHY;  Surgeon: [name], MD;  Location: [place] CV LAB;  Service: Cardiovascular;  Laterality: N/A;  . NASAL SINUS SURGERY    . SHOULDER  OPEN ROTATOR CUFF REPAIR Right   . TOTAL KNEE ARTHROPLASTY Bilateral    Current Outpatient Medications on File Prior to Visit  Medication Sig Dispense Refill  . acetaminophen (TYLENOL) 325 MG tablet Take 1-2 tablets (325-650 mg total) by mouth every 4 (four) hours as needed for mild pain.    Marland Kitchen ALPRAZolam (XANAX) 0.5 MG tablet Take 0.5 tablets (0.25 mg total) by mouth 3 (three) times daily as needed for anxiety. 30 tablet 0  . Control Gel Formula Dressing (DUODERM CGF DRESSING) MISC Apply 1 each topically daily. 12 each 3  . diclofenac sodium (VOLTAREN) 1 % GEL Apply 2 g topically 4 (four) times daily. 350 g 1  . FLUoxetine (PROZAC) 20 MG capsule TAKE ONE CAPSULE BY MOUTH DAILY 90 capsule 3  . furosemide  (LASIX) 20 MG tablet TAKE 1 TABLET ONCE A DAY. TAKE AN EXTRA TABLET FOR INCREASED SWELLING, WEIGHT GAIN, OR SHORTNESS OF BREATH 180 tablet 3  . hydrOXYzine (ATARAX/VISTARIL) 10 MG tablet Take 1 tablet (10 mg total) by mouth at bedtime. 30 tablet 0  . Lifitegrast (XIIDRA) 5 % SOLN Place 1-2 drops into both eyes daily.     . magnesium oxide (MAG-OX) 400 (241.3 Mg) MG tablet Take 1 tablet (400 mg total) by mouth 2 (two) times daily. 180 tablet 3  . metoprolol tartrate (LOPRESSOR) 25 MG tablet Take 0.5 tablets (12.5 mg total) by mouth 2 (two) times daily. 90 tablet 3  . nitroGLYCERIN (NITROSTAT) 0.4 MG SL tablet Place 1 tablet (0.4 mg total) under the tongue every 5 (five) minutes as needed for chest pain. 25 tablet 2  . polyethylene glycol (MIRALAX / GLYCOLAX) 17 g packet Take 17 g by mouth daily as needed for mild constipation. 14 each 0  . Rivaroxaban (XARELTO) 15 MG TABS tablet Take 1 tablet (15 mg total) by mouth daily with supper. 90 tablet 3  . rosuvastatin (CRESTOR) 5 MG tablet TAKE 1 TABLET BY MOUTH ONCE DAILY 90 tablet 1  . silodosin (RAPAFLO) 8 MG CAPS capsule Take 8 mg by mouth daily with breakfast.    . tacrolimus (PROTOPIC) 0.1 % ointment Apply 1 application topically 2 (two) times daily.     No current facility-administered medications on file prior to visit.   Allergies  Allergen Reactions  . Feldene [Piroxicam] Other (See Comments)    REACTION: Blisters  . Neomycin-Polymyxin-Hc Itching  . Statins Other (See Comments)    Myalgias  . Doxycycline Other (See Comments)    REACTION:  unknown  . Latex Rash  . Tequin Anxiety  . Valium Other (See Comments)    REACTION:  unknown   Social History   Socioeconomic History  . Marital status: Widowed    Spouse name: Not on file  . Number of children: Not on file  . Years of education: Not on file  . Highest education level: Not on file  Occupational History  . Not on file  Tobacco Use  . Smoking status: Never Smoker  . Smokeless  tobacco: Former Systems developer    Types: Chew  . Tobacco comment: "quit chewing in the 1960s"  Substance and Sexual Activity  . Alcohol use: No  . Drug use: No  . Sexual activity: Never  Other Topics Concern  . Not on file  Social History Narrative   01-05-18 Unable to ask abuse questions family with him today.   11-012-19 Unable to ask abuse questions family with him today.   Social Determinants of Radio broadcast assistant  Strain:   . Difficulty of Paying Living Expenses:   Food Insecurity:   . Worried About Charity fundraiser in the Last Year:   . Arboriculturist in the Last Year:   Transportation Needs:   . Film/video editor (Medical):   Marland Kitchen Lack of Transportation (Non-Medical):   Physical Activity:   . Days of Exercise per Week:   . Minutes of Exercise per Session:   Stress:   . Feeling of Stress :   Social Connections:   . Frequency of Communication with Friends and Family:   . Frequency of Social Gatherings with Friends and Family:   . Attends Religious Services:   . Active Member of Clubs or Organizations:   . Attends Archivist Meetings:   Marland Kitchen Marital Status:   Intimate Partner Violence:   . Fear of Current or Ex-Partner:   . Emotionally Abused:   Marland Kitchen Physically Abused:   . Sexually Abused:       Review of Systems  All other systems reviewed and are negative.      Objective:   Physical Exam Vitals reviewed.  Constitutional:      Appearance: Normal appearance.  Cardiovascular:     Rate and Rhythm: Normal rate and regular rhythm.  Pulmonary:     Effort: Pulmonary effort is normal.     Breath sounds: Normal breath sounds.  Musculoskeletal:     Left hand: Swelling, tenderness and bony tenderness present. Decreased range of motion.       Hands:  Neurological:     Mental Status: He is alert.   Right-sided hemiparesis with weakness in his right leg.  Weakness with knee flexion and extension and ankle flexion and extension.  Ataxia in the right  upper arm and right lower leg.         Assessment & Plan:  Acute idiopathic gout, unspecified site  Given the significance of the pain and swelling and erythema I suspect gout exacerbation.  I recommended a prednisone taper pack.  If this recurs in the future, he can take colchicine 2 tablets immediately at the first sign of gout attack and then 1 tablet 1 hour later if necessary.  Septic arthritis will be less likely.  Osteoarthritis could also be on the differential diagnosis.  He has been trying Voltaren gel with no relief over the last week.

## 2019-08-13 ENCOUNTER — Encounter (HOSPITAL_COMMUNITY): Payer: Self-pay | Admitting: Physical Therapy

## 2019-08-13 ENCOUNTER — Other Ambulatory Visit: Payer: Self-pay

## 2019-08-13 ENCOUNTER — Ambulatory Visit (HOSPITAL_COMMUNITY): Payer: Medicare HMO

## 2019-08-13 ENCOUNTER — Encounter (HOSPITAL_COMMUNITY): Payer: Self-pay

## 2019-08-13 ENCOUNTER — Ambulatory Visit (HOSPITAL_COMMUNITY): Payer: Medicare HMO | Admitting: Physical Therapy

## 2019-08-13 DIAGNOSIS — R29898 Other symptoms and signs involving the musculoskeletal system: Secondary | ICD-10-CM

## 2019-08-13 DIAGNOSIS — M6281 Muscle weakness (generalized): Secondary | ICD-10-CM

## 2019-08-13 DIAGNOSIS — R278 Other lack of coordination: Secondary | ICD-10-CM | POA: Diagnosis not present

## 2019-08-13 DIAGNOSIS — R2689 Other abnormalities of gait and mobility: Secondary | ICD-10-CM

## 2019-08-13 NOTE — Therapy (Signed)
[place], [place], [address] Phone: [phone]   Fax:  [phone]  Occupational Therapy Treatment  Patient Details  Name: Evan [name] MRN: [id number] Date of Birth: 1927-07-27 Referring Provider (OT): [name]   Encounter Date: 08/13/2019  OT End of Session - 08/13/19 1659    Visit Number  5    Number of Visits  13    Date for OT Re-Evaluation  09/10/19    Authorization Type  [employer] H[employer]    Authorization Time Period  12 visits approved (07/30/19-09/10/19) $40 copay    Authorization - Visit Number  5    Authorization - Number of Visits  12    Progress Note Due on Visit  10    OT Start Time  1605    OT Stop Time  1640    OT Time Calculation (min)  35 min    Activity Tolerance  Patient tolerated treatment well    Behavior During Therapy  [facility] for tasks assessed/performed       Past Medical History:  Diagnosis Date  . Adenocarcinoma of right lung ([place]) 01/06/2016  . Anginal chest pain at rest [facility])    Chronic non, controlled on Lorazepam  . Anxiety   . Arthritis    "all over"   . BPH (benign prostatic hyperplasia)   . CAD (coronary artery disease)   . Chronic lower back pain   . Complication of anesthesia    "problems making water afterwards"  . Depression   . DJD (degenerative joint disease)   . Esophageal ulcer without bleeding    bid ppi indefinitely  . Family history of adverse reaction to anesthesia    "children w/PONV"  . GERD (gastroesophageal reflux disease)   . Headache    "couple/week maybe" (09/04/2015)  . Hemochromatosis    Possible, elevated iron stores, hook-like osteophytes on hand films, normal LFTs  . Hepatitis    "yellow jaundice as a baby"  . History of ASCVD    MULTIVESSEL  . Hyperlipidemia   . Hypertension   . Osteoarthritis   . Pneumonia    "several times; got a little now" (09/04/2015)  . Rheumatoid arthritis ([place])   . Subdural hematoma (H[facility])    small  after fall 09/2016-plavix held, neurosurgery consulted.  Asymptomatic.    Marland Kitchen 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[place]) 02/2018   on Lovenox lifelong since he failed PO Eliquis    Past Surgical History:  Procedure Laterality Date  . ARM SKIN LESION BIOPSY / EXCISION Left 09/02/2015  . CATARACT EXTRACTION W/ INTRAOCULAR LENS  IMPLANT, BILATERAL Bilateral   . CHOLECYSTECTOMY OPEN    . CORNEAL TRANSPLANT Bilateral    "one at [facility]; one at [facility]"  . CORONARY ANGIOPLASTY WITH STENT PLACEMENT    . CORONARY ARTERY BYPASS GRAFT  1999  . DESCENDING AORTIC ANEURYSM REPAIR W/ STENT     DE stent ostium into the right radial free graft at OM-, 02-2007  . ESOPHAGOGASTRODUODENOSCOPY N/A 11/09/2014   Procedure: ESOPHAGOGASTRODUODENOSCOPY (EGD);  Surgeon: [name], MD;  Location: [facility] ENDOSCOPY;  Service: Endoscopy;  Laterality: N/A;  . INGUINAL HERNIA REPAIR    . JOINT REPLACEMENT    . LEFT HEART CATH AND CORS/GRAFTS ANGIOGRAPHY N/A 06/27/2017   Procedure: LEFT HEART CATH AND CORS/GRAFTS ANGIOGRAPHY;  Surgeon: [name], MD;  Location: [place] CV LAB;  Service: Cardiovascular;  Laterality: N/A;  . NASAL SINUS SURGERY    . SHOULDER OPEN ROTATOR CUFF REPAIR  Right   . TOTAL KNEE ARTHROPLASTY Bilateral     There were no vitals filed for this visit.  Subjective Assessment - 08/13/19 1609    Subjective   S: "Everything is about the same" (in reference to RUE)    Patient is accompanied by:  Family member   Daughter   Pertinent History  Patient is a54 y/o male S/P left CVA sustained 07/13/18. He received home health therapy services (OT, PT,SLP) and would like to continue with outpatient services due to his right hand weakness/uncoordination. [name] has referred patient to occupational therapy for evaluation and treatment.    Patient Stated Goals  To use his right hand better.    Currently in Pain?  No/denies                   OT Treatments/Exercises (OP) - 08/13/19 1611      Transfers   Transfers   Sit to Stand    Sit to Stand  4: Min guard    Comments  x2; cues for hand placement      ADLs   Home Maintenance  Simulated cleaning task. Sitting at table, patient using RUE to wipe spilt water. Providing Min hand over hand to facilitate normalized movement at shoulder as well as decrease jerky movements. Cues for maintaining flat palm on table surface. 3 reps. Upgrading task to have pt perform in standing and increase weight bearing through RUE. 2 reps with seated rest breaks between.      Exercises   Exercises  Shoulder;Hand      Shoulder Exercises: Seated   Flexion  AAROM;5 reps   x2 with seated rest breaks between; with pink ball     Shoulder Exercises: Therapy Ball   Flexion  5 reps   2 sets     Hand Exercises   Opposition  AROM;Right;5 reps   5 reps each finger     Neurological Re-education Exercises   Other Exercises 2  Gross motor activity with 1# wrist weight on RUE while removing 8 large pegs from board. Pegs placed in an X formation on board. Therapist provided min tactile cueing to right elbow to remain on table resting versus lifting up entire arm. Completed several trials.     Weight Bearing Position  --      Weight Bearing Technique   Weight Bearing Technique  Yes   Weight bearing through RUE during simulated cleaning task; s              OT Short Term Goals - 08/01/19 1310      OT SHORT TERM GOAL #1   Title  Patient and family will be educated and verbalize independence with HEP in order to increase functional use of his right hand while using for 25% or more of daily tasks.    Time  6    Period  Weeks    Status  On-going    Target Date  09/10/19      OT SHORT TERM GOAL #2   Title  Patient will increase scapular and proximal shoulder strengthening while demonstrating increased shoulder endurance during a sustained functional reaching task for more than 1 minute.    Time  6    Period  Weeks    Status  On-going      OT SHORT TERM GOAL #3   Title   Patient will increase gross motor coordination by moving 10 or more blocks during the blocks and box test allow him  to complete self feeding tasks with less difficulty.    Time  6    Period  Weeks    Status  On-going      OT SHORT TERM GOAL #4   Title  Patient will increase his right hand grip strength by 6# and his pinch strength by 3# in order to hold his utensil during meals without dropping or fatigue.    Time  6    Period  Weeks    Status  On-going      OT SHORT TERM GOAL #5   Title  Patient will be educated on adaptive eqipment/strategies that may help improve overall motor control when completing basic self care tasks.    Time  6    Period  Weeks    Status  On-going               Plan - 08/13/19 1700    Clinical Impression Statement  A: Continued use of 1lb wrist weight during pegs task and verbal cues for placing elbow on table while performing simple reaching. Continues to present with decreased acitvity tolerance as well as apraxic movements. Having pt perform simple reaching duirng simkuated cleaning task. Verbal cues for flat palm on table and tactile for normalizing movements. Proximal strengthing thorugh AAROM at shoulder using ball and bilateral reaching with use of large therapy ball (rolling back and forth).    OT Occupational Profile and History  Problem Focused Assessment - Including review of records relating to presenting problem    Occupational performance deficits (Please refer to evaluation for details):  ADL's;IADL's;Leisure    Body Structure / Function / Physical Skills  ADL;UE functional use;FMC;GMC;Coordination;IADL;Dexterity;Strength    Plan  P: Continue to focus on proximal strengthing of R shoulder and compensatory techniques to decrease apraxic movements during ADLs. Attempt UE bike next session.    Consulted and Agree with Plan of Care  Patient;Family member/caregiver    Family Member Consulted  Daughter; [name]       Patient will benefit from  skilled therapeutic intervention in order to improve the following deficits and impairments:   Body Structure / Function / Physical Skills: ADL, UE functional use, FMC, GMC, Coordination, IADL, Dexterity, Strength       Visit Diagnosis: Other lack of coordination  Other symptoms and signs involving the musculoskeletal system    Problem List Patient Active Problem List   Diagnosis Date Noted  . Sundowning 07/04/2019  . Bilateral hearing loss 04/16/2019  . Urinary frequency 03/21/2019  . Chronic right shoulder pain 12/19/2018  . Weakness generalized 09/28/2018  . Palliative care encounter 09/28/2018  . Pressure injury of skin 08/27/2018  . COPD (chronic obstructive pulmonary disease) ([place]) 08/26/2018  . AMS (altered mental status) 08/22/2018  . Altered mental status 08/21/2018  . UTI (urinary tract infection) 08/21/2018  . Abnormality of gait 08/20/2018  . Prolonged Q-T interval on ECG   . Dysphagia, post-stroke   . Supplemental oxygen dependent   . Anxiety state   . Fatigue   . SOB (shortness of breath)   . Hyperglycemia   . History of lung cancer   . Depression   . CKD (chronic kidney disease), stage III   . Acute blood loss anemia   . Fall 07/16/2018  . 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 Acute spontaneous 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 ([place]) L thalamic 07/14/2018  . Cellulitis of arm, right 06/01/2018  . Rash and nonspecific  skin eruption 06/01/2018  . Chest pain 02/02/2018  . Thrombocytopenia ([place]) 02/02/2018  . Hypokalemia 02/02/2018  . Pulmonary embolism ([place]) 02/02/2018  . Elevated troponin 02/02/2018  . Pulmonary embolus ([place]) 02/02/2018  . Non-ST elevation MI (NSTEMI) ([place]) 06/27/2017  . Diastolic CHF ([place]) 38/25/0539  . Chest pain due to CAD ([place]) 06/26/2017  . Dyspnea 06/21/2017  . Acute bronchitis 05/15/2017  . Oral candidiasis 05/15/2017  . Acute pericarditis   .  Chest pain at rest 12/20/2016  . Angina pectoris ([place]) 12/20/2016  . Radiation pneumonitis ([place]) 11/09/2016  . Pleurisy 10/11/2016  . Chest wall pain 10/11/2016  . Atrial fibrillation ([place]) 05/21/2016  . Adenocarcinoma of right lung ([place]) 01/06/2016  . GAD (generalized anxiety disorder) 11/20/2015  . Essential hypertension   . Cellulitis 09/04/2015  . Cellulitis of left axilla   . Esophageal ulcer without bleeding   . Bilateral lower extremity edema 04/28/2014  . BPH (benign prostatic hyperplasia) 10/08/2012  . Anxiety   . EKG abnormalities 09/05/2012  . Tremor 09/05/2012  . Chronic fatigue 09/05/2012  . Arthritis   . Asthma, chronic   . Reflux esophagitis   . S/P CABG (coronary artery bypass graft)   . GERD (gastroesophageal reflux disease)   . Hyperlipidemia   . Hypertension   . UNSPECIFIED PERIPHERAL VASCULAR DISEASE 06/16/2009    [facility] MSOT, OTR/L APOP Rehab 08/13/2019, [place] [address] [place], [place], [address] Phone: [phone]   Fax:  [phone]  Name: Evan [name] MRN: [id number] Date of Birth: Jan 12, 1928

## 2019-08-13 NOTE — Therapy (Signed)
[place], [place], [address] Phone: [phone]   Fax:  [phone]  Physical Therapy Treatment  Patient Details  Name: Evan [name] MRN: [id number] Date of Birth: Jul 10, 1927 Referring Provider (PT): [name] MD    Encounter Date: 08/13/2019  PT End of Session - 08/13/19 1641    Visit Number  9    Number of Visits  12    Date for PT Re-Evaluation  08/23/19    Authorization Type  [employer] Medicare    Authorization Time Period  07/10/19-08/23/19    Authorization - Number of Visits  9    Progress Note Due on Visit  10    PT Start Time  1640    PT Stop Time  1720    PT Time Calculation (min)  40 min    Equipment Utilized During Treatment  Gait belt    Activity Tolerance  Patient limited by pain;Patient limited by fatigue    Behavior During Therapy  [facility] for tasks assessed/performed       Past Medical History:  Diagnosis Date  . Adenocarcinoma of right lung ([place]) 01/06/2016  . Anginal chest pain at rest [facility])    Chronic non, controlled on Lorazepam  . Anxiety   . Arthritis    "all over"   . BPH (benign prostatic hyperplasia)   . CAD (coronary artery disease)   . Chronic lower back pain   . Complication of anesthesia    "problems making water afterwards"  . Depression   . DJD (degenerative joint disease)   . Esophageal ulcer without bleeding    bid ppi indefinitely  . Family history of adverse reaction to anesthesia    "children w/PONV"  . GERD (gastroesophageal reflux disease)   . Headache    "couple/week maybe" (09/04/2015)  . Hemochromatosis    Possible, elevated iron stores, hook-like osteophytes on hand films, normal LFTs  . Hepatitis    "yellow jaundice as a baby"  . History of ASCVD    MULTIVESSEL  . Hyperlipidemia   . Hypertension   . Osteoarthritis   . Pneumonia    "several times; got a little now" (09/04/2015)  . Rheumatoid arthritis ([place])   . Subdural hematoma ([facility])    small after fall  09/2016-plavix held, neurosurgery consulted.  Asymptomatic.    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 ARM SKIN LESION BIOPSY / EXCISION Left 09/02/2015  . CATARACT EXTRACTION W/ INTRAOCULAR LENS  IMPLANT, BILATERAL Bilateral   . CHOLECYSTECTOMY OPEN    . CORNEAL TRANSPLANT Bilateral    "one at [facility]; one at [facility]"  . CORONARY ANGIOPLASTY WITH STENT PLACEMENT    . CORONARY ARTERY BYPASS GRAFT  1999  . DESCENDING AORTIC ANEURYSM REPAIR W/ STENT     DE stent ostium into the right radial free graft at OM-, 02-2007  . ESOPHAGOGASTRODUODENOSCOPY N/A 11/09/2014   Procedure: ESOPHAGOGASTRODUODENOSCOPY (EGD);  Surgeon: [name], MD;  Location: [facility] ENDOSCOPY;  Service: Endoscopy;  Laterality: N/A;  . INGUINAL HERNIA REPAIR    . JOINT REPLACEMENT    . LEFT HEART CATH AND CORS/GRAFTS ANGIOGRAPHY N/A 06/27/2017   Procedure: LEFT HEART CATH AND CORS/GRAFTS ANGIOGRAPHY;  Surgeon: [name], MD;  Location: [place] CV LAB;  Service: Cardiovascular;  Laterality: N/A;  . NASAL SINUS SURGERY    . SHOULDER OPEN ROTATOR CUFF REPAIR Right   .  TOTAL KNEE ARTHROPLASTY Bilateral     There were no vitals filed for this visit.  Subjective Assessment - 08/13/19 1642    Subjective  Patients daughter says he had f.u with MD recently and was diagnosed with gout, likely causing his hand and arm pain. Says he started on a steroid dose pack and is feeling much better. Says he has even been able to do more walking unassisted at home.    Patient is accompained by:  Family member    Pertinent History  CVA effecting RT side on 07/13/18, lumbar fracture December 2021    Patient Stated Goals  Be able to walk    Currently in Pain?  No/denies                       [facility] Adult PT Treatment/Exercise - 08/13/19 1740      Knee/Hip Exercises: Standing   Gait Training  170 feet with RW and WC follow, rest  break x3, cues for upright posture and TA activation       Knee/Hip Exercises: Seated   Long Arc Quad  Both;2 sets;10 reps;Weights    Long Arc Quad Weight  1 lbs.    Marching  Both;2 sets;10 reps    Marching Limitations  1    Sit to [employer]  5 reps;with UE support      Shoulder Exercises: Standing   Other Standing Exercises  staggered stance 1 x 15" each, static standing 2 x15"               PT Short Term Goals - 07/10/19 0950      PT SHORT TERM GOAL #1   Title  Patient will be independent with initial HEP to improve functional outcomes    Time  3    Period  Weeks    Status  New    Target Date  08/02/19      PT SHORT TERM GOAL #2   Title  Patient will be able to perform stand x 5 in < 40 seconds to demonstrate improvement in functional mobility and reduced risk for falls.    Time  3    Period  Weeks    Status  New    Target Date  08/02/19        PT Long Term Goals - 07/10/19 0951      PT LONG TERM GOAL #1   Title  Patient will be able to perform stand x 5 in < 25 seconds to demonstrate improvement in functional mobility and reduced risk for falls.    Time  6    Period  Weeks    Status  New    Target Date  08/23/19      PT LONG TERM GOAL #2   Title  Patient will be able to perform TUG in <15 seconds, using least restrictive assistive device, to demonstrate improvement in functional mobility and reduced risk for falls.    Time  6    Period  Weeks    Status  New    Target Date  08/23/19      PT LONG TERM GOAL #3   Title  Patient will have equal to or > 4/5 MMT throughout BLE to improve ability to perform functional mobility, stair ambulation and ADLs.    Time  6    Period  Weeks    Status  New    Target Date  08/23/19  Plan - 08/13/19 1739    Clinical Impression Statement  Patient with improved tolerance to functional activity and was able to ambulate farther today. Patient continues be challenged with balance, and shows increased  apprehension when standing unsupported. Patient did note increased discomfort in low back with increased standing, ambulation. Patient cued in tightening core to engage TA for improved postural stability and decreased low back strain. Patient will continue to benefit from skilled therapy services to progress LE strength and balance to improve functional mobility and reduce future risk for falls    Personal Factors and Comorbidities  Age;Comorbidity 2    Comorbidities  HX ov CVA, lumbar fracture    Examination-Activity Limitations  Bathing;Bed Mobility;Bend;Squat;Stairs;Carry;Stand;Toileting;Dressing;Transfers;Hygiene/Grooming;Lift;Locomotion Level    Examination-Participation Restrictions  Community Activity;Other;Cleaning    Stability/Clinical Decision Making  Stable/Uncomplicated    Rehab Potential  Fair    PT Frequency  2x / week    PT Duration  6 weeks    PT Treatment/Interventions  ADLs/Self Care Home Management;Aquatic Therapy;Biofeedback;Cryotherapy;Electrical Stimulation;Iontophoresis 4mg /ml Dexamethasone;Moist Heat;Traction;Balance training;Manual techniques;Therapeutic exercise;Vasopneumatic Device;Wheelchair mobility training;Therapeutic activities;Splinting;Taping;Functional mobility training;Stair training;Orthotic Fit/Training;Gait training;DME Instruction;Patient/family education;Passive range of motion;Dry needling;Energy conservation;Other (comment);Joint Manipulations;Spinal Manipulations;Compression bandaging;Neuromuscular re-education;Cognitive remediation;Ultrasound;Parrafin;Fluidtherapy;Contrast Bath    PT Next Visit Plan  continue with static balance and gait progression as tolerated. try sidestepping next visit    PT Home Exercise Plan  07/10/19: seated march, LAQs, seated heel/toe raise; 07/15/19: unsupported sitting 1-2 min rounds    Consulted and Agree with Plan of Care  Patient;Family member/caregiver    Family Member Consulted  Daughter       Patient will benefit from  skilled therapeutic intervention in order to improve the following deficits and impairments:  Abnormal gait, Decreased endurance, Hypomobility, Impaired sensation, Decreased activity tolerance, Decreased strength, Impaired UE functional use, Decreased balance, Decreased mobility, Difficulty walking, Decreased coordination, Decreased safety awareness, Postural dysfunction  Visit Diagnosis: Other abnormalities of gait and mobility  Muscle weakness (generalized)     Problem List Patient Active Problem List   Diagnosis Date Noted  . Sundowning 07/04/2019  . Bilateral hearing loss 04/16/2019  . Urinary frequency 03/21/2019  . Chronic right shoulder pain 12/19/2018  . Weakness generalized 09/28/2018  . Palliative care encounter 09/28/2018  . Pressure injury of skin 08/27/2018  . COPD (chronic obstructive pulmonary disease) ([place]) 08/26/2018  . AMS (altered mental status) 08/22/2018  . Altered mental status 08/21/2018  . UTI (urinary tract infection) 08/21/2018  . Abnormality of gait 08/20/2018  . Prolonged Q-T interval on ECG   . Dysphagia, post-stroke   . Supplemental oxygen dependent   . Anxiety state   . Fatigue   . SOB (shortness of breath)   . Hyperglycemia   . History of lung cancer   . Depression   . CKD (chronic kidney disease), stage III   . Acute blood loss anemia   . Fall 07/16/2018  . 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 Acute spontaneous 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 ([place]) L thalamic 07/14/2018  . Cellulitis of arm, right 06/01/2018  . Rash and nonspecific skin eruption 06/01/2018  . Chest pain 02/02/2018  . Thrombocytopenia ([place]) 02/02/2018  . Hypokalemia 02/02/2018  . Pulmonary embolism ([place]) 02/02/2018  . Elevated troponin 02/02/2018  . Pulmonary embolus ([place]) 02/02/2018  . Non-ST elevation MI (NSTEMI) ([place]) 06/27/2017  . Diastolic CHF ([place]) 77/41/2878  . Chest pain due to CAD  ([place]) 06/26/2017  . Dyspnea 06/21/2017  . Acute bronchitis 05/15/2017  . Oral  candidiasis 05/15/2017  . Acute pericarditis   . Chest pain at rest 12/20/2016  . Angina pectoris ([place]) 12/20/2016  . Radiation pneumonitis ([place]) 11/09/2016  . Pleurisy 10/11/2016  . Chest wall pain 10/11/2016  . Atrial fibrillation ([place]) 05/21/2016  . Adenocarcinoma of right lung ([place]) 01/06/2016  . GAD (generalized anxiety disorder) 11/20/2015  . Essential hypertension   . Cellulitis 09/04/2015  . Cellulitis of left axilla   . Esophageal ulcer without bleeding   . Bilateral lower extremity edema 04/28/2014  . BPH (benign prostatic hyperplasia) 10/08/2012  . Anxiety   . EKG abnormalities 09/05/2012  . Tremor 09/05/2012  . Chronic fatigue 09/05/2012  . Arthritis   . Asthma, chronic   . Reflux esophagitis   . S/P CABG (coronary artery bypass graft)   . GERD (gastroesophageal reflux disease)   . Hyperlipidemia   . Hypertension   . UNSPECIFIED PERIPHERAL VASCULAR DISEASE 06/16/2009    5:46 PM, 08/13/19 Josue Hector PT DPT  Physical Therapist with [facility]  [phone] [place], [place], [address] Phone: [phone]   Fax:  [phone]  Name: INA POUPARD MRN: [id number] Date of Birth: 06/30/1927

## 2019-08-14 ENCOUNTER — Ambulatory Visit: Payer: Medicare HMO | Admitting: Critical Care Medicine

## 2019-08-14 DIAGNOSIS — I610 Nontraumatic intracerebral hemorrhage in hemisphere, subcortical: Secondary | ICD-10-CM | POA: Diagnosis not present

## 2019-08-14 DIAGNOSIS — I619 Nontraumatic intracerebral hemorrhage, unspecified: Secondary | ICD-10-CM | POA: Diagnosis not present

## 2019-08-14 DIAGNOSIS — D689 Coagulation defect, unspecified: Secondary | ICD-10-CM | POA: Diagnosis not present

## 2019-08-14 NOTE — Progress Notes (Deleted)
Synopsis: Referred in 2017 for PE by [name], MD. Previously a patient of Drs. Nestor and [employer]  Subjective:   PATIENT ID: Evan [name] GENDER: male DOB: 26-Mar-1928, MRN: [id number]  No chief complaint on file.   HPI  Last seen November 2020 Lovenox for history of submassive PE-follow-up echo with improvement RV function Had Pe on eliquis for Afib Persistently mildly dilated RV, had been normal 7 months prior on echocardiogram.  LA had been chronically dilated.  History of lung cancer in remission, 2017 treated with SBRT  History of tobacco abuse with small airway disease  Deconditioning-working with physical therapy   Past Medical History:  Diagnosis Date  . Adenocarcinoma of right lung ([place]) 01/06/2016  . Anginal chest pain at rest [facility])    Chronic non, controlled on Lorazepam  . Anxiety   . Arthritis    "all over"   . BPH (benign prostatic hyperplasia)   . CAD (coronary artery disease)   . Chronic lower back pain   . Complication of anesthesia    "problems making water afterwards"  . Depression   . DJD (degenerative joint disease)   . Esophageal ulcer without bleeding    bid ppi indefinitely  . Family history of adverse reaction to anesthesia    "children w/PONV"  . GERD (gastroesophageal reflux disease)   . Headache    "couple/week maybe" (09/04/2015)  . Hemochromatosis    Possible, elevated iron stores, hook-like osteophytes on hand films, normal LFTs  . Hepatitis    "yellow jaundice as a baby"  . History of ASCVD    MULTIVESSEL  . Hyperlipidemia   . Hypertension   . Osteoarthritis   . Pneumonia    "several times; got a little now" (09/04/2015)  . Rheumatoid arthritis ([place])   . Subdural hematoma ([facility])    small after fall 09/2016-plavix held, neurosurgery consulted.  Asymptomatic.    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 Arthritis-Osteo Sister    . Heart attack Brother   . Heart attack Other   . Cancer Neg Hx      Past Surgical History:  Procedure Laterality Date  . ARM SKIN LESION BIOPSY / EXCISION Left 09/02/2015  . CATARACT EXTRACTION W/ INTRAOCULAR LENS  IMPLANT, BILATERAL Bilateral   . CHOLECYSTECTOMY OPEN    . CORNEAL TRANSPLANT Bilateral    "one at [facility]; one at [facility]"  . CORONARY ANGIOPLASTY WITH STENT PLACEMENT    . CORONARY ARTERY BYPASS GRAFT  1999  . DESCENDING AORTIC ANEURYSM REPAIR W/ STENT     DE stent ostium into the right radial free graft at OM-, 02-2007  . ESOPHAGOGASTRODUODENOSCOPY N/A 11/09/2014   Procedure: ESOPHAGOGASTRODUODENOSCOPY (EGD);  Surgeon: [name], MD;  Location: [facility] ENDOSCOPY;  Service: Endoscopy;  Laterality: N/A;  . INGUINAL HERNIA REPAIR    . JOINT REPLACEMENT    . LEFT HEART CATH AND CORS/GRAFTS ANGIOGRAPHY N/A 06/27/2017   Procedure: LEFT HEART CATH AND CORS/GRAFTS ANGIOGRAPHY;  Surgeon: [name], MD;  Location: [facility] CV LAB;  Service: Cardiovascular;  Laterality: N/A;  . NASAL SINUS SURGERY    . SHOULDER OPEN ROTATOR CUFF REPAIR Right   . TOTAL KNEE ARTHROPLASTY Bilateral     Social History   Socioeconomic History  . Marital status: Widowed    Spouse name: Not on file  . Number of children: Not on file  .  Years of education: Not on file  . Highest education level: Not on file  Occupational History  . Not on file  Tobacco Use  . Smoking status: Never Smoker  . Smokeless tobacco: Former Systems developer    Types: Chew  . Tobacco comment: "quit chewing in the 1960s"  Substance and Sexual Activity  . Alcohol use: No  . Drug use: No  . Sexual activity: Never  Other Topics Concern  . Not on file  Social History Narrative   01-05-18 Unable to ask abuse questions family with him today.   11-012-19 Unable to ask abuse questions family with him today.   Social Determinants of Health   Financial Resource Strain:   . Difficulty of Paying Living Expenses:   Food Insecurity:    . Worried About Charity fundraiser in the Last Year:   . Arboriculturist in the Last Year:   Transportation Needs:   . Film/video editor (Medical):   Marland Kitchen Lack of Transportation (Non-Medical):   Physical Activity:   . Days of Exercise per Week:   . Minutes of Exercise per Session:   Stress:   . Feeling of Stress :   Social Connections:   . Frequency of Communication with Friends and Family:   . Frequency of Social Gatherings with Friends and Family:   . Attends Religious Services:   . Active Member of Clubs or Organizations:   . Attends Archivist Meetings:   Marland Kitchen Marital Status:   Intimate Partner Violence:   . Fear of Current or Ex-Partner:   . Emotionally Abused:   Marland Kitchen Physically Abused:   . Sexually Abused:      Allergies  Allergen Reactions  . Feldene [Piroxicam] Other (See Comments)    REACTION: Blisters  . Neomycin-Polymyxin-Hc Itching  . Statins Other (See Comments)    Myalgias  . Doxycycline Other (See Comments)    REACTION:  unknown  . Latex Rash  . Tequin Anxiety  . Valium Other (See Comments)    REACTION:  unknown     Immunization History  Administered Date(s) Administered  . Fluad Quad(high Dose 65+) 01/04/2019  . Influenza Whole 01/26/2007, 01/10/2008, 12/30/2009  . Influenza, High Dose Seasonal PF 12/23/2016, 02/09/2018  . Influenza,inj,Quad PF,6+ Mos 12/18/2012, 01/16/2015, 12/29/2015  . PFIZER SARS-COV-2 Vaccination 05/10/2019, 06/04/2019  . Pneumococcal Conjugate-13 06/24/2014  . Pneumococcal Polysaccharide-23 01/25/2000  . Td 12/27/2000, 03/09/2010  . Tdap 03/09/2010    Outpatient Medications Prior to Visit  Medication Sig Dispense Refill  . acetaminophen (TYLENOL) 325 MG tablet Take 1-2 tablets (325-650 mg total) by mouth every 4 (four) hours as needed for mild pain.    Marland Kitchen ALPRAZolam (XANAX) 0.5 MG tablet Take 0.5 tablets (0.25 mg total) by mouth 3 (three) times daily as needed for anxiety. 30 tablet 0  . colchicine 0.6 MG tablet  Take 1 tablet (0.6 mg total) by mouth daily as needed. Take 2 at the first sign of gout, then 1 more pill one hour later if still hurting. 30 tablet 0  . Control Gel Formula Dressing (DUODERM CGF DRESSING) MISC Apply 1 each topically daily. 12 each 3  . diclofenac sodium (VOLTAREN) 1 % GEL Apply 2 g topically 4 (four) times daily. 350 g 1  . FLUoxetine (PROZAC) 20 MG capsule TAKE ONE CAPSULE BY MOUTH DAILY 90 capsule 3  . furosemide (LASIX) 20 MG tablet TAKE 1 TABLET ONCE A DAY. TAKE AN EXTRA TABLET FOR INCREASED SWELLING, WEIGHT GAIN, OR SHORTNESS  OF BREATH 180 tablet 3  . hydrOXYzine (ATARAX/VISTARIL) 10 MG tablet Take 1 tablet (10 mg total) by mouth at bedtime. 30 tablet 0  . Lifitegrast (XIIDRA) 5 % SOLN Place 1-2 drops into both eyes daily.     . magnesium oxide (MAG-OX) 400 (241.3 Mg) MG tablet Take 1 tablet (400 mg total) by mouth 2 (two) times daily. 180 tablet 3  . metoprolol tartrate (LOPRESSOR) 25 MG tablet Take 0.5 tablets (12.5 mg total) by mouth 2 (two) times daily. 90 tablet 3  . nitroGLYCERIN (NITROSTAT) 0.4 MG SL tablet Place 1 tablet (0.4 mg total) under the tongue every 5 (five) minutes as needed for chest pain. 25 tablet 2  . polyethylene glycol (MIRALAX / GLYCOLAX) 17 g packet Take 17 g by mouth daily as needed for mild constipation. 14 each 0  . predniSONE (DELTASONE) 20 MG tablet 3 tabs poqday 1-2, 2 tabs poqday 3-4, 1 tab poqday 5-6 12 tablet 0  . Rivaroxaban (XARELTO) 15 MG TABS tablet Take 1 tablet (15 mg total) by mouth daily with supper. 90 tablet 3  . rosuvastatin (CRESTOR) 5 MG tablet TAKE 1 TABLET BY MOUTH ONCE DAILY 90 tablet 1  . silodosin (RAPAFLO) 8 MG CAPS capsule Take 8 mg by mouth daily with breakfast.    . tacrolimus (PROTOPIC) 0.1 % ointment Apply 1 application topically 2 (two) times daily.     No facility-administered medications prior to visit.    ROS   Objective:  There were no vitals filed for this visit.   on *** LPM *** RA BMI Readings from  Last 3 Encounters:  08/09/19 25.70 kg/m  08/02/19 25.39 kg/m  07/04/19 33.13 kg/m   Wt Readings from Last 3 Encounters:  08/09/19 169 lb (76.7 kg)  08/02/19 167 lb (75.8 kg)  07/04/19 193 lb (87.5 kg)    Physical Exam   CBC    Component Value Date/Time   WBC 9.2 12/24/2018 1600   RBC 4.35 12/24/2018 1600   HGB 13.4 12/24/2018 1600   HGB 13.4 08/16/2016 1155   HCT 40.5 12/24/2018 1600   HCT 39.8 08/16/2016 1155   PLT 154 12/24/2018 1600   PLT 81 (L) 08/16/2016 1155   MCV 93.1 12/24/2018 1600   MCV 92.3 08/16/2016 1155   MCH 30.8 12/24/2018 1600   MCHC 33.1 12/24/2018 1600   RDW 14.8 12/24/2018 1600   RDW 13.3 08/16/2016 1155   LYMPHSABS 1.9 12/24/2018 1600   LYMPHSABS 1.3 08/16/2016 1155   MONOABS 1.1 (H) 12/24/2018 1600   MONOABS 1.2 (H) 08/16/2016 1155   EOSABS 0.3 12/24/2018 1600   EOSABS 0.1 08/16/2016 1155   BASOSABS 0.0 12/24/2018 1600   BASOSABS 0.0 08/16/2016 1155    CHEMISTRY No results for input(s): NA, K, CL, CO2, GLUCOSE, BUN, CREATININE, CALCIUM, MG, PHOS in the last 168 hours. CrCl cannot be calculated (Patient's most recent lab result is older than the maximum 21 days allowed.).   Chest Imaging- films reviewed: CT chest with contrast 06/05/2019-mild subpleural interlobular septal thickening on the right upper and lower lobes laterally.  Right basilar opacity with air bronchograms, focal radiation change.  Airway thickening and emphysema.  Severely dilated LA.  Appears RVOT is dilated without main pulmonary artery dilation.  Pulmonary Functions Testing Results: PFT Results Latest Ref Rng & Units 01/05/2016  FVC-Pre L 3.26  FVC-Predicted Pre % 111  FVC-Post L 3.23  FVC-Predicted Post % 109  Pre FEV1/FVC % % 80  Post FEV1/FCV % % 80  FEV1-Pre L 2.61  FEV1-Predicted Pre % 131  FEV1-Post L 2.58  DLCO UNC% % 44  DLCO COR %Predicted % 56  TLC L 5.47  TLC % Predicted % 90  RV % Predicted % 86   2017-no significant obstruction or bronchodilator  reversibility.  No restriction or hyperinflation.  Severely impaired diffusion capacity.  Flow volume loop suggests mild obstruction.  Pathology: ***  Echocardiogram 11/21/2018: ***LVEF 60 to 65%, mild LVH.  Due to atrial fibrillation, diastolic dysfunction is indeterminate.  Severely dilated LA, RV mildly enlarged, normal wall thickness and mildly reduced systolic function.  Moderately dilated RA.  Mild to moderate TR.  RVSP 37.6  Heart Catheterization: ***    Assessment & Plan:   No diagnosis found.    Current Outpatient Medications:  .  acetaminophen (TYLENOL) 325 MG tablet, Take 1-2 tablets (325-650 mg total) by mouth every 4 (four) hours as needed for mild pain., Disp: , Rfl:  .  ALPRAZolam (XANAX) 0.5 MG tablet, Take 0.5 tablets (0.25 mg total) by mouth 3 (three) times daily as needed for anxiety., Disp: 30 tablet, Rfl: 0 .  colchicine 0.6 MG tablet, Take 1 tablet (0.6 mg total) by mouth daily as needed. Take 2 at the first sign of gout, then 1 more pill one hour later if still hurting., Disp: 30 tablet, Rfl: 0 .  Control Gel Formula Dressing (DUODERM CGF DRESSING) MISC, Apply 1 each topically daily., Disp: 12 each, Rfl: 3 .  diclofenac sodium (VOLTAREN) 1 % GEL, Apply 2 g topically 4 (four) times daily., Disp: 350 g, Rfl: 1 .  FLUoxetine (PROZAC) 20 MG capsule, TAKE ONE CAPSULE BY MOUTH DAILY, Disp: 90 capsule, Rfl: 3 .  furosemide (LASIX) 20 MG tablet, TAKE 1 TABLET ONCE A DAY. TAKE AN EXTRA TABLET FOR INCREASED SWELLING, WEIGHT GAIN, OR SHORTNESS OF BREATH, Disp: 180 tablet, Rfl: 3 .  hydrOXYzine (ATARAX/VISTARIL) 10 MG tablet, Take 1 tablet (10 mg total) by mouth at bedtime., Disp: 30 tablet, Rfl: 0 .  Lifitegrast (XIIDRA) 5 % SOLN, Place 1-2 drops into both eyes daily. , Disp: , Rfl:  .  magnesium oxide (MAG-OX) 400 (241.3 Mg) MG tablet, Take 1 tablet (400 mg total) by mouth 2 (two) times daily., Disp: 180 tablet, Rfl: 3 .  metoprolol tartrate (LOPRESSOR) 25 MG tablet, Take 0.5  tablets (12.5 mg total) by mouth 2 (two) times daily., Disp: 90 tablet, Rfl: 3 .  nitroGLYCERIN (NITROSTAT) 0.4 MG SL tablet, Place 1 tablet (0.4 mg total) under the tongue every 5 (five) minutes as needed for chest pain., Disp: 25 tablet, Rfl: 2 .  polyethylene glycol (MIRALAX / GLYCOLAX) 17 g packet, Take 17 g by mouth daily as needed for mild constipation., Disp: 14 each, Rfl: 0 .  predniSONE (DELTASONE) 20 MG tablet, 3 tabs poqday 1-2, 2 tabs poqday 3-4, 1 tab poqday 5-6, Disp: 12 tablet, Rfl: 0 .  Rivaroxaban (XARELTO) 15 MG TABS tablet, Take 1 tablet (15 mg total) by mouth daily with supper., Disp: 90 tablet, Rfl: 3 .  rosuvastatin (CRESTOR) 5 MG tablet, TAKE 1 TABLET BY MOUTH ONCE DAILY, Disp: 90 tablet, Rfl: 1 .  silodosin (RAPAFLO) 8 MG CAPS capsule, Take 8 mg by mouth daily with breakfast., Disp: , Rfl:  .  tacrolimus (PROTOPIC) 0.1 % ointment, Apply 1 application topically 2 (two) times daily., Disp: , Rfl:    I spent *** minutes on this encounter, including face to face time and non-face to face time spent reviewing records,  charting, coordinating care, etc.   Julian Hy, DO [facility] 08/14/2019 8:55 AM

## 2019-08-15 ENCOUNTER — Ambulatory Visit (HOSPITAL_COMMUNITY): Payer: Medicare HMO | Admitting: Physical Therapy

## 2019-08-15 ENCOUNTER — Ambulatory Visit (HOSPITAL_COMMUNITY): Payer: Medicare HMO

## 2019-08-15 ENCOUNTER — Other Ambulatory Visit: Payer: Self-pay

## 2019-08-15 ENCOUNTER — Encounter (HOSPITAL_COMMUNITY): Payer: Self-pay | Admitting: Physical Therapy

## 2019-08-15 DIAGNOSIS — I619 Nontraumatic intracerebral hemorrhage, unspecified: Secondary | ICD-10-CM | POA: Diagnosis not present

## 2019-08-15 DIAGNOSIS — M6281 Muscle weakness (generalized): Secondary | ICD-10-CM | POA: Diagnosis not present

## 2019-08-15 DIAGNOSIS — R29898 Other symptoms and signs involving the musculoskeletal system: Secondary | ICD-10-CM

## 2019-08-15 DIAGNOSIS — D689 Coagulation defect, unspecified: Secondary | ICD-10-CM | POA: Diagnosis not present

## 2019-08-15 DIAGNOSIS — R2689 Other abnormalities of gait and mobility: Secondary | ICD-10-CM | POA: Diagnosis not present

## 2019-08-15 DIAGNOSIS — R278 Other lack of coordination: Secondary | ICD-10-CM

## 2019-08-15 DIAGNOSIS — I610 Nontraumatic intracerebral hemorrhage in hemisphere, subcortical: Secondary | ICD-10-CM | POA: Diagnosis not present

## 2019-08-15 NOTE — Therapy (Signed)
[place], [place], [address] Phone: [phone]   Fax:  [phone]  Physical Therapy Treatment/ Progress Note  Patient Details  Name: Evan [name] MRN: [id number] Date of Birth: [birth year] Referring Provider (PT): [name] MD   Progress Note Reporting Period 07/10/19 to 08/15/19  See note below for Objective Data and Assessment of Progress/Goals.       Encounter Date: 08/15/2019  PT End of Session - 08/15/19 0942    Visit Number  10    Number of Visits  20    Date for PT Re-Evaluation  08/23/19    Authorization Type  [employer] Medicare (submitted for 8 visit extension on 08/15/19)    Authorization Time Period  07/10/19-08/23/19    Authorization - Number of Visits  10    Progress Note Due on Visit  20    PT Start Time  0944    PT Stop Time  1030    PT Time Calculation (min)  46 min    Equipment Utilized During Treatment  Gait belt    Activity Tolerance  Patient limited by fatigue    Behavior During Therapy  WFL for tasks assessed/performed       Past Medical History:  Diagnosis Date  . Adenocarcinoma of right lung ([place]) 01/06/2016  . Anginal chest pain at rest [facility])    Chronic non, controlled on Lorazepam  . Anxiety   . Arthritis    "all over"   . BPH (benign prostatic hyperplasia)   . CAD (coronary artery disease)   . Chronic lower back pain   . Complication of anesthesia    "problems making water afterwards"  . Depression   . DJD (degenerative joint disease)   . Esophageal ulcer without bleeding    bid ppi indefinitely  . Family history of adverse reaction to anesthesia    "children w/PONV"  . GERD (gastroesophageal reflux disease)   . Headache    "couple/week maybe" (09/04/2015)  . Hemochromatosis    Possible, elevated iron stores, hook-like osteophytes on hand films, normal LFTs  . Hepatitis    "yellow jaundice as a baby"  . History of ASCVD    MULTIVESSEL  . Hyperlipidemia   .  Hypertension   . Osteoarthritis   . Pneumonia    "several times; got a little now" (09/04/2015)  . Rheumatoid arthritis ([place])   . Subdural hematoma (HCC)    small after fall 09/2016-plavix held, neurosurgery consulted.  Asymptomatic.    Marland Kitchen 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[place]) 02/2018   on Lovenox lifelong since he failed PO Eliquis    Past Surgical History:  Procedure Laterality Date  . ARM SKIN LESION BIOPSY / EXCISION Left 09/02/2015  . CATARACT EXTRACTION W/ INTRAOCULAR LENS  IMPLANT, BILATERAL Bilateral   . CHOLECYSTECTOMY OPEN    . CORNEAL TRANSPLANT Bilateral    "one at [facility]; one at [facility]"  . CORONARY ANGIOPLASTY WITH STENT PLACEMENT    . CORONARY ARTERY BYPASS GRAFT  1999  . DESCENDING AORTIC ANEURYSM REPAIR W/ STENT     DE stent ostium into the right radial free graft at OM-, 02-2007  . ESOPHAGOGASTRODUODENOSCOPY N/A 11/09/2014   Procedure: ESOPHAGOGASTRODUODENOSCOPY (EGD);  Surgeon: [name], MD;  Location: [facility] ENDOSCOPY;  Service: Endoscopy;  Laterality: N/A;  . INGUINAL HERNIA REPAIR    . JOINT REPLACEMENT    . LEFT HEART CATH AND CORS/GRAFTS ANGIOGRAPHY N/A 06/27/2017   Procedure: LEFT HEART CATH AND CORS/GRAFTS ANGIOGRAPHY;  Surgeon: Shelva Majestic  A, MD;  Location: [place] CV LAB;  Service: Cardiovascular;  Laterality: N/A;  . NASAL SINUS SURGERY    . SHOULDER OPEN ROTATOR CUFF REPAIR Right   . TOTAL KNEE ARTHROPLASTY Bilateral     There were no vitals filed for this visit.  Subjective Assessment - 08/15/19 0946    Subjective  Patient says he is doing "alright". Patient daughter states she feels patient is doing much better, seems more motivated, is more active overall, and can tell that he is stronger.    Patient is accompained by:  Family member    Pertinent History  CVA effecting RT side on 07/13/18, lumbar fracture December 2021    Patient Stated Goals  Be able to walk    Currently in Pain?  No/denies   No pain at rest, some discomfort in lower leg when he moves         [facility] PT Assessment - 08/15/19 0948      Assessment   Medical Diagnosis  Weakness    Referring Provider (PT)  [name] MD     Onset Date/Surgical Date  07/13/18    Prior Therapy  yes. Home health OT, PT, SLP after CVA      Precautions   Precautions  Fall      Restrictions   Weight Bearing Restrictions  No      Balance Screen   Has the patient fallen in the past 6 months  No    How many times?  --      [place] residence    Living Arrangements  Children    Available Help at Discharge  Family    Type of [place] entrance    [place] or work area in basement;Able to live on main level with bedroom/bathroom      Prior Function   Level of Independence  Needs assistance with ADLs      Cognition   Overall Cognitive Status  Impaired/Different from baseline      Observation/Other Assessments   Focus on Therapeutic Outcomes (FOTO)   N/A      Sensation   Light Touch  Impaired by gross assessment      Strength   Overall Strength Comments  Assessed seated    Right Hip Flexion  4+/5   was 3-   Right Hip Extension  4+/5   was 4-   Right Hip ABduction  4+/5   was 4-   Left Hip Flexion  4+/5   was 4   Left Hip Extension  5/5   was 4+   Left Hip ABduction  4+/5   was 4   Right Knee Flexion  4/5   was 4-   Right Knee Extension  4+/5   was 4   Left Knee Flexion  5/5   was 4+   Left Knee Extension  5/5   was 4+   Right Ankle Dorsiflexion  4+/5   was  4   Left Ankle Dorsiflexion  5/5   was 4+     Transfers   Five time sit to stand comments   55 sec with use of UEs, patient had difficulty with RLE and hand placement on 1-2 reps, some ongoing difficulty wth controlled descent       Ambulation/Gait   Ambulation/Gait  Yes    Ambulation/Gait Assistance  4: Min guard    Ambulation Distance (  Feet)  60 Feet    Assistive device  Rolling walker    Gait Pattern  Step-to pattern;Decreased  step length - right;Decreased step length - left;Decreased stance time - right;Decreased stride length;Decreased dorsiflexion - right;Ataxic;Trunk flexed    Ambulation Surface  Level;Indoor    Gait Comments  no rest break, requires cues for RLE placement       Static Standing Balance   Static Standing - Comment/# of Minutes  static standing balance, able to hold 10-15 seconds without support       Standardized Balance Assessment   Standardized Balance Assessment  Timed Up and Go Test      Timed Up and Go Test   TUG  Normal TUG    Normal TUG (seconds)  65    TUG Comments  using RW                     [facility] Adult PT Treatment/Exercise - 08/15/19 1019      Knee/Hip Exercises: Standing   Hip Abduction  Both;1 set;10 reps;Knee straight    Abduction Limitations  assist for knee extension on RT. at // bars       Knee/Hip Exercises: Seated   Sit to Sand  2 sets;5 reps;with UE support             PT Education - 08/15/19 1050    Education Details  on reassessment findings, progress toward gaols, POC and adding static standing to HEP    Person(s) Educated  Patient;Child(ren)    Methods  Explanation    Comprehension  Verbalized understanding       PT Short Term Goals - 08/15/19 1051      PT SHORT TERM GOAL #1   Title  Patient will be independent with initial HEP to improve functional outcomes    Baseline  Reports compliance    Time  3    Period  Weeks    Status  Achieved    Target Date  08/02/19      PT SHORT TERM GOAL #2   Title  Patient will be able to perform stand x 5 in < 40 seconds to demonstrate improvement in functional mobility and reduced risk for falls.    Baseline  current 55 sec    Time  3    Period  Weeks    Status  On-going    Target Date  08/02/19        PT Long Term Goals - 08/15/19 1051      PT LONG TERM GOAL #1   Title  Patient will be able to perform stand x 5 in < 25 seconds to demonstrate improvement in functional mobility and  reduced risk for falls.    Baseline  current 55 sec    Time  6    Period  Weeks    Status  On-going      PT LONG TERM GOAL #2   Title  Patient will be able to perform TUG in <15 seconds, using least restrictive assistive device, to demonstrate improvement in functional mobility and reduced risk for falls.    Baseline  current 65 sec with RW    Time  6    Period  Weeks    Status  On-going      PT LONG TERM GOAL #3   Title  Patient will have equal to or > 4/5 MMT throughout BLE to improve ability to perform functional mobility, stair ambulation and ADLs.  Baseline  See MMT    Time  6    Period  Weeks    Status  Achieved            Plan - 08/15/19 1056    Clinical Impression Statement  Patient making slow but steady progress toward therapy goals. Patient currently with  short term and 1/3 long term therapy goals met. Patient shows improvement in isolated strength testing and ambulation distance, but continues to be significantly limited by fatigue, postural deficits, poor balance, and gait abnormalities which continue to negatively impact functional ability. Patient will continue to benefit from skilled therapy services to address remaining deficits to improve LOF with ADLs and functional mobility and reduce risk for future falls.    Personal Factors and Comorbidities  Age;Comorbidity 2    Comorbidities  HX ov CVA, lumbar fracture    Examination-Activity Limitations  Bathing;Bed Mobility;Bend;Squat;Stairs;Carry;Stand;Toileting;Dressing;Transfers;Hygiene/Grooming;Lift;Locomotion Level    Examination-Participation Restrictions  Community Activity;Other;Cleaning    Stability/Clinical Decision Making  Stable/Uncomplicated    Rehab Potential  Fair    PT Frequency  2x / week    PT Duration  6 weeks    PT Treatment/Interventions  ADLs/Self Care Home Management;Aquatic Therapy;Biofeedback;Cryotherapy;Electrical Stimulation;Iontophoresis 68m/ml Dexamethasone;Moist Heat;Traction;Balance  training;Manual techniques;Therapeutic exercise;Vasopneumatic Device;Wheelchair mobility training;Therapeutic activities;Splinting;Taping;Functional mobility training;Stair training;Orthotic Fit/Training;Gait training;DME Instruction;Patient/family education;Passive range of motion;Dry needling;Energy conservation;Other (comment);Joint Manipulations;Spinal Manipulations;Compression bandaging;Neuromuscular re-education;Cognitive remediation;Ultrasound;Parrafin;Fluidtherapy;Contrast Bath    PT Next Visit Plan  continue with static balance and gait progression as tolerated. try sidestepping next visit    PT Home Exercise Plan  07/10/19: seated march, LAQs, seated heel/toe raise; 07/15/19: unsupported sitting 1-2 min rounds; 08/15/19: static standing at sink or counter    Consulted and Agree with Plan of Care  Patient;Family member/caregiver    Family Member Consulted  Daughter       Patient will benefit from skilled therapeutic intervention in order to improve the following deficits and impairments:  Abnormal gait, Decreased endurance, Hypomobility, Impaired sensation, Decreased activity tolerance, Decreased strength, Impaired UE functional use, Decreased balance, Decreased mobility, Difficulty walking, Decreased coordination, Decreased safety awareness, Postural dysfunction  Visit Diagnosis: Other abnormalities of gait and mobility  Muscle weakness (generalized)     Problem List Patient Active Problem List   Diagnosis Date Noted  . Sundowning 07/04/2019  . Bilateral hearing loss 04/16/2019  . Urinary frequency 03/21/2019  . Chronic right shoulder pain 12/19/2018  . Weakness generalized 09/28/2018  . Palliative care encounter 09/28/2018  . Pressure injury of skin 08/27/2018  . COPD (chronic obstructive pulmonary disease) ([place] 08/26/2018  . AMS (altered mental status) 08/22/2018  . Altered mental status 08/21/2018  . UTI (urinary tract infection) 08/21/2018  . Abnormality of gait 08/20/2018   . Prolonged Q-T interval on ECG   . Dysphagia, post-stroke   . Supplemental oxygen dependent   . Anxiety state   . Fatigue   . SOB (shortness of breath)   . Hyperglycemia   . History of lung cancer   . Depression   . CKD (chronic kidney disease), stage III   . Acute blood loss anemia   . Fall 07/16/2018  . 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 Acute spontaneous 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 ([place] L thalamic 07/14/2018  . Cellulitis of arm, right 06/01/2018  . Rash and nonspecific skin eruption 06/01/2018  . Chest pain 02/02/2018  . Thrombocytopenia ([place] 02/02/2018  . Hypokalemia 02/02/2018  . Pulmonary embolism ([place] 02/02/2018  . Elevated troponin 02/02/2018  .  Pulmonary embolus ([place]) 02/02/2018  . Non-ST elevation MI (NSTEMI) ([place]) 06/27/2017  . Diastolic CHF ([place]) 74/12/9276  . Chest pain due to CAD ([place]) 06/26/2017  . Dyspnea 06/21/2017  . Acute bronchitis 05/15/2017  . Oral candidiasis 05/15/2017  . Acute pericarditis   . Chest pain at rest 12/20/2016  . Angina pectoris ([place]) 12/20/2016  . Radiation pneumonitis ([place]) 11/09/2016  . Pleurisy 10/11/2016  . Chest wall pain 10/11/2016  . Atrial fibrillation ([place]) 05/21/2016  . Adenocarcinoma of right lung ([place]) 01/06/2016  . GAD (generalized anxiety disorder) 11/20/2015  . Essential hypertension   . Cellulitis 09/04/2015  . Cellulitis of left axilla   . Esophageal ulcer without bleeding   . Bilateral lower extremity edema 04/28/2014  . BPH (benign prostatic hyperplasia) 10/08/2012  . Anxiety   . EKG abnormalities 09/05/2012  . Tremor 09/05/2012  . Chronic fatigue 09/05/2012  . Arthritis   . Asthma, chronic   . Reflux esophagitis   . S/P CABG (coronary artery bypass graft)   . GERD (gastroesophageal reflux disease)   . Hyperlipidemia   . Hypertension   . UNSPECIFIED PERIPHERAL VASCULAR DISEASE 06/16/2009    11:00 AM,  08/15/19 Josue Hector PT DPT  Physical Therapist with [facility]  [phone] [place] [address] [place], [place], [address] Phone: [phone]   Fax:  [phone]  Name: Evan [name] MRN: [id number] Date of Birth: [birth year]

## 2019-08-15 NOTE — Therapy (Signed)
[place], [place], [address] Phone: [phone]   Fax:  [phone]  Occupational Therapy Treatment  Patient Details  Name: Evan [name] MRN: [id number] Date of Birth: May 18, 1927 Referring Provider (OT): [name]   Encounter Date: 08/15/2019  OT End of Session - 08/15/19 1014    Visit Number  6    Number of Visits  13    Date for OT Re-Evaluation  09/10/19    Authorization Type  [employer] HMO    Authorization Time Period  12 visits approved (07/30/19-09/10/19) $40 copay    Authorization - Visit Number  6    Authorization - Number of Visits  12    Progress Note Due on Visit  10    OT Start Time  0912   Pt arrived late   OT Stop Time  0944    OT Time Calculation (min)  32 min    Activity Tolerance  Patient tolerated treatment well    Behavior During Therapy  [facility] for tasks assessed/performed       Past Medical History:  Diagnosis Date  . Adenocarcinoma of right lung ([place]) 01/06/2016  . Anginal chest pain at rest [facility])    Chronic non, controlled on Lorazepam  . Anxiety   . Arthritis    "all over"   . BPH (benign prostatic hyperplasia)   . CAD (coronary artery disease)   . Chronic lower back pain   . Complication of anesthesia    "problems making water afterwards"  . Depression   . DJD (degenerative joint disease)   . Esophageal ulcer without bleeding    bid ppi indefinitely  . Family history of adverse reaction to anesthesia    "children w/PONV"  . GERD (gastroesophageal reflux disease)   . Headache    "couple/week maybe" (09/04/2015)  . Hemochromatosis    Possible, elevated iron stores, hook-like osteophytes on hand films, normal LFTs  . Hepatitis    "yellow jaundice as a baby"  . History of ASCVD    MULTIVESSEL  . Hyperlipidemia   . Hypertension   . Osteoarthritis   . Pneumonia    "several times; got a little now" (09/04/2015)  . Rheumatoid arthritis ([place])   . Subdural hematoma  (HCC)    small after fall 09/2016-plavix held, neurosurgery consulted.  Asymptomatic.    Marland Kitchen 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[place]) 02/2018   on Lovenox lifelong since he failed PO Eliquis    Past Surgical History:  Procedure Laterality Date  . ARM SKIN LESION BIOPSY / EXCISION Left 09/02/2015  . CATARACT EXTRACTION W/ INTRAOCULAR LENS  IMPLANT, BILATERAL Bilateral   . CHOLECYSTECTOMY OPEN    . CORNEAL TRANSPLANT Bilateral    "one at [facility]; one at [facility]"  . CORONARY ANGIOPLASTY WITH STENT PLACEMENT    . CORONARY ARTERY BYPASS GRAFT  1999  . DESCENDING AORTIC ANEURYSM REPAIR W/ STENT     DE stent ostium into the right radial free graft at OM-, 02-2007  . ESOPHAGOGASTRODUODENOSCOPY N/A 11/09/2014   Procedure: ESOPHAGOGASTRODUODENOSCOPY (EGD);  Surgeon: [name], MD;  Location[facility] ENDOSCOPY;  Service: Endoscopy;  Laterality: N/A;  . INGUINAL HERNIA REPAIR    . JOINT REPLACEMENT    . LEFT HEART CATH AND CORS/GRAFTS ANGIOGRAPHY N/A 06/27/2017   Procedure: LEFT HEART CATH AND CORS/GRAFTS ANGIOGRAPHY;  Surgeon: [name], MD;  Location: [place] CV LAB;  Service: Cardiovascular;  Laterality: N/A;  . NASAL SINUS SURGERY    . SHOULDER  OPEN ROTATOR CUFF REPAIR Right   . TOTAL KNEE ARTHROPLASTY Bilateral     There were no vitals filed for this visit.  Subjective Assessment - 08/15/19 0957    Subjective   S: Nothing new to report.    Patient is accompanied by:  Family member   Daughter   Currently in Pain?  No/denies         [facility] OT Assessment - 08/15/19 0932      Assessment   Medical Diagnosis  right hand weakness      Precautions   Precautions  Fall               OT Treatments/Exercises (OP) - 08/15/19 4827      ADLs   UB Dressing  Education completed on doffing jacket during session. Patient was able to successfully doff for the first time using the pull over head technique from behind using his left hand. VC to complete.       Exercises   Exercises  Shoulder      Shoulder  Exercises: Seated   Extension  Left;10 reps;Theraband    Theraband Level (Shoulder Extension)  Level 2 (Red)    Row  Left;10 reps;Theraband    Theraband Level (Shoulder Row)  Level 2 (Red)    Horizontal ABduction  Left;10 reps;Theraband    Theraband Level (Shoulder Horizontal ABduction)  Level 2 (Red)      Shoulder Exercises: ROM/Strengthening   UBE (Upper Arm Bike)  Level 2 2' forward   pace: attempted to stay near 4.0-4.5            OT Education - 08/15/19 1013    Education Details  red theraband strengthening: row, extension, horizontal abduction    Person(s) Educated  Patient;Child(ren)    Methods  Explanation;Demonstration;Verbal cues;Handout    Comprehension  Returned demonstration;Verbalized understanding       OT Short Term Goals - 08/01/19 1310      OT SHORT TERM GOAL #1   Title  Patient and family will be educated and verbalize independence with HEP in order to increase functional use of his right hand while using for 25% or more of daily tasks.    Time  6    Period  Weeks    Status  On-going    Target Date  09/10/19      OT SHORT TERM GOAL #2   Title  Patient will increase scapular and proximal shoulder strengthening while demonstrating increased shoulder endurance during a sustained functional reaching task for more than 1 minute.    Time  6    Period  Weeks    Status  On-going      OT SHORT TERM GOAL #3   Title  Patient will increase gross motor coordination by moving 10 or more blocks during the blocks and box test allow him to complete self feeding tasks with less difficulty.    Time  6    Period  Weeks    Status  On-going      OT SHORT TERM GOAL #4   Title  Patient will increase his right hand grip strength by 6# and his pinch strength by 3# in order to hold his utensil during meals without dropping or fatigue.    Time  6    Period  Weeks    Status  On-going      OT SHORT TERM GOAL #5   Title  Patient will be educated on adaptive  eqipment/strategies that may help  improve overall motor control when completing basic self care tasks.    Time  6    Period  Weeks    Status  On-going               Plan - 08/15/19 1014    Clinical Impression Statement  A: Utilized red theraband to work on proximal shoulder strengthening. Patient required verbal and visual cueing for  form and technique while requiring rest breaks frequently and occassionally to break up repetitions. UBE bike used at end of session. Patient demonstrated fatigue at 1'30" mark and required VC to continue until 2'. Right hand required some assistance to readjust to hold handle.    Body Structure / Function / Physical Skills  ADL;UE functional use;FMC;GMC;Coordination;IADL;Dexterity;Strength    Plan  P: Follow up on doff jackets and shirts at home using pull over method from last session. Follow up on new theraband exercises. Continue to work on increasing proximal shoulder strengthening with use of red theraband (Pt enjoyed this).    OT Home Exercise Plan  eval: stress ball for grip and pinch strengthening. 5/13: red theraband proximal shoulder strengthening.    Consulted and Agree with Plan of Care  Patient;Family member/caregiver    Family Member Consulted  Daughter; [name]       Patient will benefit from skilled therapeutic intervention in order to improve the following deficits and impairments:   Body Structure / Function / Physical Skills: ADL, UE functional use, FMC, GMC, Coordination, IADL, Dexterity, Strength       Visit Diagnosis: Other symptoms and signs involving the musculoskeletal system  Other lack of coordination    Problem List Patient Active Problem List   Diagnosis Date Noted  . Sundowning 07/04/2019  . Bilateral hearing loss 04/16/2019  . Urinary frequency 03/21/2019  . Chronic right shoulder pain 12/19/2018  . Weakness generalized 09/28/2018  . Palliative care encounter 09/28/2018  . Pressure injury of skin  08/27/2018  . COPD (chronic obstructive pulmonary disease) ([place]) 08/26/2018  . AMS (altered mental status) 08/22/2018  . Altered mental status 08/21/2018  . UTI (urinary tract infection) 08/21/2018  . Abnormality of gait 08/20/2018  . Prolonged Q-T interval on ECG   . Dysphagia, post-stroke   . Supplemental oxygen dependent   . Anxiety state   . Fatigue   . SOB (shortness of breath)   . Hyperglycemia   . History of lung cancer   . Depression   . CKD (chronic kidney disease), stage III   . Acute blood loss anemia   . Fall 07/16/2018  . 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 Acute spontaneous 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 ([place]) L thalamic 07/14/2018  . Cellulitis of arm, right 06/01/2018  . Rash and nonspecific skin eruption 06/01/2018  . Chest pain 02/02/2018  . Thrombocytopenia ([place]) 02/02/2018  . Hypokalemia 02/02/2018  . Pulmonary embolism ([place]) 02/02/2018  . Elevated troponin 02/02/2018  . Pulmonary embolus ([place]) 02/02/2018  . Non-ST elevation MI (NSTEMI) ([place]) 06/27/2017  . Diastolic CHF ([place]) 67/89/3810  . Chest pain due to CAD ([place]) 06/26/2017  . Dyspnea 06/21/2017  . Acute bronchitis 05/15/2017  . Oral candidiasis 05/15/2017  . Acute pericarditis   . Chest pain at rest 12/20/2016  . Angina pectoris ([place]) 12/20/2016  . Radiation pneumonitis ([place]) 11/09/2016  . Pleurisy 10/11/2016  . Chest wall pain 10/11/2016  . Atrial fibrillation ([place]) 05/21/2016  . Adenocarcinoma of right lung ([place]) 01/06/2016  . GAD (  generalized anxiety disorder) 11/20/2015  . Essential hypertension   . Cellulitis 09/04/2015  . Cellulitis of left axilla   . Esophageal ulcer without bleeding   . Bilateral lower extremity edema 04/28/2014  . BPH (benign prostatic hyperplasia) 10/08/2012  . Anxiety   . EKG abnormalities 09/05/2012  . Tremor 09/05/2012  . Chronic fatigue 09/05/2012  . Arthritis   . Asthma,  chronic   . Reflux esophagitis   . S/P CABG (coronary artery bypass graft)   . GERD (gastroesophageal reflux disease)   . Hyperlipidemia   . Hypertension   . UNSPECIFIED PERIPHERAL VASCULAR DISEASE 06/16/2009   Ailene Ravel, OTR/L,CBIS  [phone]  08/15/2019, 10:19 AM  [place] [address] [place], [place], [address] Phone: [phone]   Fax:  [phone]  Name: Evan [name] MRN: [id number] Date of Birth: 1927-10-08

## 2019-08-15 NOTE — Patient Instructions (Signed)
1) (Home) Extension: Isometric / Bilateral Arm Retraction - Sitting   Facing anchor, hold hands and elbow at shoulder height, with elbow bent.  Pull arms back to squeeze shoulder blades together. Repeat 10-12 times. 1-2 times/day.   2) (Clinic) Extension / Flexion (Assist)   Face anchor, pull arms back, keeping elbow straight, and squeze shoulder blades together. Repeat 10-12 times. 1-2 times/day.   Copyright  VHI. All rights reserved.   3) (Home) Retraction: Row - Bilateral (Anchor)   Facing anchor, arms reaching forward, pull hands toward stomach, keeping elbows bent and at your sides and pinching shoulder blades together. Repeat 10-12 times. 1-2 times/day.   Copyright  VHI. All rights reserved.    ELASTIC BAND - HORIZONTAL ABDUCTION  Start by holding an elastic band or tubing with your arm out-stretched in front of you and across your body towards the opposite side.  Next, pull the elastic band or cord horizontally and outward as shown.   Your elbow should be straight or slightly bent the entire time.    Complete 10-12 times. 1-2 times a day.

## 2019-08-20 ENCOUNTER — Ambulatory Visit (HOSPITAL_COMMUNITY): Payer: Medicare HMO | Admitting: Occupational Therapy

## 2019-08-20 ENCOUNTER — Ambulatory Visit (HOSPITAL_COMMUNITY): Payer: Medicare HMO | Admitting: Physical Therapy

## 2019-08-20 ENCOUNTER — Encounter (HOSPITAL_COMMUNITY): Payer: Self-pay | Admitting: Occupational Therapy

## 2019-08-20 ENCOUNTER — Encounter (HOSPITAL_COMMUNITY): Payer: Self-pay | Admitting: Physical Therapy

## 2019-08-20 ENCOUNTER — Other Ambulatory Visit: Payer: Self-pay

## 2019-08-20 DIAGNOSIS — R2689 Other abnormalities of gait and mobility: Secondary | ICD-10-CM | POA: Diagnosis not present

## 2019-08-20 DIAGNOSIS — R278 Other lack of coordination: Secondary | ICD-10-CM

## 2019-08-20 DIAGNOSIS — M6281 Muscle weakness (generalized): Secondary | ICD-10-CM

## 2019-08-20 DIAGNOSIS — R29898 Other symptoms and signs involving the musculoskeletal system: Secondary | ICD-10-CM | POA: Diagnosis not present

## 2019-08-20 NOTE — Therapy (Signed)
[facility] [place], [place], [address] Phone: [phone]   Fax:  [phone]  Occupational Therapy Treatment  Patient Details  Name: Evan [name] MRN: [id number] Date of Birth: [birth year] Referring Provider (OT): [name]   Encounter Date: 08/20/2019  OT End of Session - 08/20/19 1158    Visit Number  7    Number of Visits  13    Date for OT Re-Evaluation  09/10/19    Authorization Type  [employer] Medicare HMO    Authorization Time Period  12 visits approved (07/30/19-09/10/19) $40 copay    Authorization - Visit Number  7    Authorization - Number of Visits  12    Progress Note Due on Visit  10    OT Start Time  1114    OT Stop Time  1153    OT Time Calculation (min)  39 min    Activity Tolerance  Patient tolerated treatment well    Behavior During Therapy  [facility] for tasks assessed/performed       Past Medical History:  Diagnosis Date  . Adenocarcinoma of right lung ([place]) 01/06/2016  . Anginal chest pain at rest [facility])    Chronic non, controlled on Lorazepam  . Anxiety   . Arthritis    "all over"   . BPH (benign prostatic hyperplasia)   . CAD (coronary artery disease)   . Chronic lower back pain   . Complication of anesthesia    "problems making water afterwards"  . Depression   . DJD (degenerative joint disease)   . Esophageal ulcer without bleeding    bid ppi indefinitely  . Family history of adverse reaction to anesthesia    "children w/PONV"  . GERD (gastroesophageal reflux disease)   . Headache    "couple/week maybe" (09/04/2015)  . Hemochromatosis    Possible, elevated iron stores, hook-like osteophytes on hand films, normal LFTs  . Hepatitis    "yellow jaundice as a baby"  . History of ASCVD    MULTIVESSEL  . Hyperlipidemia   . Hypertension   . Osteoarthritis   . Pneumonia    "several times; got a little now" (09/04/2015)  . Rheumatoid arthritis ([place])   . Subdural hematoma (H[facility])    small  after fall 09/2016-plavix held, neurosurgery consulted.  Asymptomatic.    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 ARM SKIN LESION BIOPSY / EXCISION Left 09/02/2015  . CATARACT EXTRACTION W/ INTRAOCULAR LENS  IMPLANT, BILATERAL Bilateral   . CHOLECYSTECTOMY OPEN    . CORNEAL TRANSPLANT Bilateral    "one at [facility]; one at [facility]"  . CORONARY ANGIOPLASTY WITH STENT PLACEMENT    . CORONARY ARTERY BYPASS GRAFT  1999  . DESCENDING AORTIC ANEURYSM REPAIR W/ STENT     DE stent ostium into the right radial free graft at OM-, 02-2007  . ESOPHAGOGASTRODUODENOSCOPY N/A 11/09/2014   Procedure: ESOPHAGOGASTRODUODENOSCOPY (EGD);  Surgeon: [name], MD;  Location: [facility] ENDOSCOPY;  Service: Endoscopy;  Laterality: N/A;  . INGUINAL HERNIA REPAIR    . JOINT REPLACEMENT    . LEFT HEART CATH AND CORS/GRAFTS ANGIOGRAPHY N/A 06/27/2017   Procedure: LEFT HEART CATH AND CORS/GRAFTS ANGIOGRAPHY;  Surgeon: [name], MD;  Location: [facility] CV LAB;  Service: Cardiovascular;  Laterality: N/A;  . NASAL SINUS SURGERY    . SHOULDER OPEN ROTATOR CUFF REPAIR  Right   . TOTAL KNEE ARTHROPLASTY Bilateral     There were no vitals filed for this visit.  Subjective Assessment - 08/20/19 1110    Subjective   S: I like the bands.    Patient is accompanied by:  Family member   daughter   Currently in Pain?  No/denies         [facility] OT Assessment - 08/20/19 1109      Assessment   Medical Diagnosis  Weakness      Precautions   Precautions  Fall      Restrictions   Weight Bearing Restrictions  No               OT Treatments/Exercises (OP) - 08/20/19 1114      Exercises   Exercises  Shoulder      Shoulder Exercises: Seated   Row  Left;10 reps;Theraband    Theraband Level (Shoulder Row)  Level 2 (Red)    Protraction  Theraband;10 reps    Theraband Level (Shoulder Protraction)  Level 2 (Red)     Horizontal ABduction  Left;10 reps;Theraband    Theraband Level (Shoulder Horizontal ABduction)  Level 2 (Red)      Hand Exercises   Other Hand Exercises  Pt putting together 2 sets of 4 pegs, then taking them apart. Working on sustained pinch and fine motor coordination. Max difficulty picking up pegs from table, once picked up min/mod difficulty putting together.       Neurological Re-education Exercises   Other Exercises 2  Gross motor activity using 1# wrist weight and boom wacker sticks. Pt holding stick in right hand, reaching to the left, right, and center to hit sticks held by OT and assistant. Pt completing for ~ 5 minutes, dropping his stick ~6x during activity. Also completed similar activity while holding orange volleyball. Pt completing protraction and diagonals to touch OTs ball. Pt demonstrates better coordination and sustained hold on the ball during protraction and diagonal to the left. Often dropping ball on diagonal to the right. Pt completed 10X.     Other Grasp and Release Exercises   Using 1# wrist weight pt placing large pegs along top row and both sides of smaller foam pegboard. Pt with good release before pulling hand away, increased difficulty with placing along the right side of the board.                OT Short Term Goals - 08/01/19 1310      OT SHORT TERM GOAL #1   Title  Patient and family will be educated and verbalize independence with HEP in order to increase functional use of his right hand while using for 25% or more of daily tasks.    Time  6    Period  Weeks    Status  On-going    Target Date  09/10/19      OT SHORT TERM GOAL #2   Title  Patient will increase scapular and proximal shoulder strengthening while demonstrating increased shoulder endurance during a sustained functional reaching task for more than 1 minute.    Time  6    Period  Weeks    Status  On-going      OT SHORT TERM GOAL #3   Title  Patient will increase gross motor  coordination by moving 10 or more blocks during the blocks and box test allow him to complete self feeding tasks with less difficulty.    Time  6  Period  Weeks    Status  On-going      OT SHORT TERM GOAL #4   Title  Patient will increase his right hand grip strength by 6# and his pinch strength by 3# in order to hold his utensil during meals without dropping or fatigue.    Time  6    Period  Weeks    Status  On-going      OT SHORT TERM GOAL #5   Title  Patient will be educated on adaptive eqipment/strategies that may help improve overall motor control when completing basic self care tasks.    Time  6    Period  Weeks    Status  On-going               Plan - 08/20/19 1158    Clinical Impression Statement  A: Pt's daughter reports he has not practiced self-dressing techniques, it is easier to just help him. Session working on RUE stability and gross motor coordination, as well as grip strength and some fine motor exercises. Pt completing functional reaching and sustained grasp activity with boom wackers and volleyball today. Pt did well with activity completing with only 1 rest break during 5 minutes period. Pt with improved stability when using 1# wrist weight during all tasks. Verbal cuing for form and technique.    Body Structure / Function / Physical Skills  ADL;UE functional use;FMC;GMC;Coordination;IADL;Dexterity;Strength    Plan  P: Continue with shoulder stability and gross motor tasks-boom wackers, ball exercises    OT Home Exercise Plan  eval: stress ball for grip and pinch strengthening. 5/13: red theraband proximal shoulder strengthening.    Consulted and Agree with Plan of Care  Patient;Family member/caregiver    Family Member Consulted  Daughter; [name]       Patient will benefit from skilled therapeutic intervention in order to improve the following deficits and impairments:   Body Structure / Function / Physical Skills: ADL, UE functional use, FMC, GMC,  Coordination, IADL, Dexterity, Strength       Visit Diagnosis: Other symptoms and signs involving the musculoskeletal system  Other lack of coordination    Problem List Patient Active Problem List   Diagnosis Date Noted  . Sundowning 07/04/2019  . Bilateral hearing loss 04/16/2019  . Urinary frequency 03/21/2019  . Chronic right shoulder pain 12/19/2018  . Weakness generalized 09/28/2018  . Palliative care encounter 09/28/2018  . Pressure injury of skin 08/27/2018  . COPD (chronic obstructive pulmonary disease) ([place]) 08/26/2018  . AMS (altered mental status) 08/22/2018  . Altered mental status 08/21/2018  . UTI (urinary tract infection) 08/21/2018  . Abnormality of gait 08/20/2018  . Prolonged Q-T interval on ECG   . Dysphagia, post-stroke   . Supplemental oxygen dependent   . Anxiety state   . Fatigue   . SOB (shortness of breath)   . Hyperglycemia   . History of lung cancer   . Depression   . CKD (chronic kidney disease), stage III   . Acute blood loss anemia   . Fall 07/16/2018  . 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 Acute spontaneous 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 ([place]) L thalamic 07/14/2018  . Cellulitis of arm, right 06/01/2018  . Rash and nonspecific skin eruption 06/01/2018  . Chest pain 02/02/2018  . Thrombocytopenia ([place]) 02/02/2018  . Hypokalemia 02/02/2018  . Pulmonary embolism ([place]) 02/02/2018  . Elevated troponin 02/02/2018  . Pulmonary embolus ([place]) 02/02/2018  .  Non-ST elevation MI (NSTEMI) ([place]) 06/27/2017  . Diastolic CHF ([place]) 58/59/2924  . Chest pain due to CAD ([place]) 06/26/2017  . Dyspnea 06/21/2017  . Acute bronchitis 05/15/2017  . Oral candidiasis 05/15/2017  . Acute pericarditis   . Chest pain at rest 12/20/2016  . Angina pectoris ([place]) 12/20/2016  . Radiation pneumonitis ([place]) 11/09/2016  . Pleurisy 10/11/2016  . Chest wall pain 10/11/2016  . Atrial  fibrillation ([place]) 05/21/2016  . Adenocarcinoma of right lung ([place]) 01/06/2016  . GAD (generalized anxiety disorder) 11/20/2015  . Essential hypertension   . Cellulitis 09/04/2015  . Cellulitis of left axilla   . Esophageal ulcer without bleeding   . Bilateral lower extremity edema 04/28/2014  . BPH (benign prostatic hyperplasia) 10/08/2012  . Anxiety   . EKG abnormalities 09/05/2012  . Tremor 09/05/2012  . Chronic fatigue 09/05/2012  . Arthritis   . Asthma, chronic   . Reflux esophagitis   . S/P CABG (coronary artery bypass graft)   . GERD (gastroesophageal reflux disease)   . Hyperlipidemia   . Hypertension   . UNSPECIFIED PERIPHERAL VASCULAR DISEASE 06/16/2009   Guadelupe Sabin, OTR/L  [phone] 08/20/2019, 12:01 PM  [facility] [address] [place], [place], [address] Phone: [phone]   Fax:  [phone]  Name: MAVERICK DIEUDONNE MRN: [id number] Date of Birth: [birth year]

## 2019-08-20 NOTE — Therapy (Signed)
[place], [place], [address] Phone: [phone]   Fax:  [phone]  Physical Therapy Treatment  Patient Details  Name: Evan [name] MRN: [id number] Date of Birth: Jul 02, 1927 Referring Provider (PT): [name] MD    Encounter Date: 08/20/2019  PT End of Session - 08/20/19 0908    Visit Number  11    Number of Visits  20    Date for PT Re-Evaluation  08/23/19    Authorization Type  [employer] Medicare (submitted for 8 visit extension on 08/15/19)    Authorization Time Period  07/10/19-08/23/19    Authorization - Number of Visits  11    Progress Note Due on Visit  20    PT Start Time  0903    PT Stop Time  0945    PT Time Calculation (min)  42 min    Equipment Utilized During Treatment  Gait belt    Activity Tolerance  Patient limited by fatigue    Behavior During Therapy  [facility] for tasks assessed/performed       Past Medical History:  Diagnosis Date  . Adenocarcinoma of right lung ([place]) 01/06/2016  . Anginal chest pain at rest [facility])    Chronic non, controlled on Lorazepam  . Anxiety   . Arthritis    "all over"   . BPH (benign prostatic hyperplasia)   . CAD (coronary artery disease)   . Chronic lower back pain   . Complication of anesthesia    "problems making water afterwards"  . Depression   . DJD (degenerative joint disease)   . Esophageal ulcer without bleeding    bid ppi indefinitely  . Family history of adverse reaction to anesthesia    "children w/PONV"  . GERD (gastroesophageal reflux disease)   . Headache    "couple/week maybe" (09/04/2015)  . Hemochromatosis    Possible, elevated iron stores, hook-like osteophytes on hand films, normal LFTs  . Hepatitis    "yellow jaundice as a baby"  . History of ASCVD    MULTIVESSEL  . Hyperlipidemia   . Hypertension   . Osteoarthritis   . Pneumonia    "several times; got a little now" (09/04/2015)  . Rheumatoid arthritis ([place])   . Subdural hematoma  (H[facility])    small after fall 09/2016-plavix held, neurosurgery consulted.  Asymptomatic.    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 ARM SKIN LESION BIOPSY / EXCISION Left 09/02/2015  . CATARACT EXTRACTION W/ INTRAOCULAR LENS  IMPLANT, BILATERAL Bilateral   . CHOLECYSTECTOMY OPEN    . CORNEAL TRANSPLANT Bilateral    "one at [facility]; one at [facility]"  . CORONARY ANGIOPLASTY WITH STENT PLACEMENT    . CORONARY ARTERY BYPASS GRAFT  1999  . DESCENDING AORTIC ANEURYSM REPAIR W/ STENT     DE stent ostium into the right radial free graft at OM-, 02-2007  . ESOPHAGOGASTRODUODENOSCOPY N/A 11/09/2014   Procedure: ESOPHAGOGASTRODUODENOSCOPY (EGD);  Surgeon: [name], MD;  Location: [facility] ENDOSCOPY;  Service: Endoscopy;  Laterality: N/A;  . INGUINAL HERNIA REPAIR    . JOINT REPLACEMENT    . LEFT HEART CATH AND CORS/GRAFTS ANGIOGRAPHY N/A 06/27/2017   Procedure: LEFT HEART CATH AND CORS/GRAFTS ANGIOGRAPHY;  Surgeon: [name], MD;  Location: [place] CV LAB;  Service: Cardiovascular;  Laterality: N/A;  . NASAL SINUS SURGERY    . SHOULDER OPEN  ROTATOR CUFF REPAIR Right   . TOTAL KNEE ARTHROPLASTY Bilateral     There were no vitals filed for this visit.  Subjective Assessment - 08/20/19 0907    Subjective  Patient says he feels a little weak in his legs this AM. Reports no pain currenlty. Nothing new otherwise.    Patient is accompained by:  Family member    Pertinent History  CVA effecting RT side on 07/13/18, lumbar fracture December 2021    Patient Stated Goals  Be able to walk    Currently in Pain?  No/denies                        [facility] Adult PT Treatment/Exercise - 08/20/19 0001      Knee/Hip Exercises: Standing   Hip Abduction  Both;1 set;10 reps;Knee straight    Abduction Limitations  assist for knee extension on RT. at // bars     Gait Training  130 feet with RW and WC follow,  rest break x 2    Other Standing Knee Exercises  turn around cone 6' apart, 2 RT       Knee/Hip Exercises: Seated   Sit to Sand  2 sets;5 reps;with UE support               PT Short Term Goals - 08/15/19 1051      PT SHORT TERM GOAL #1   Title  Patient will be independent with initial HEP to improve functional outcomes    Baseline  Reports compliance    Time  3    Period  Weeks    Status  Achieved    Target Date  08/02/19      PT SHORT TERM GOAL #2   Title  Patient will be able to perform stand x 5 in < 40 seconds to demonstrate improvement in functional mobility and reduced risk for falls.    Baseline  current 55 sec    Time  3    Period  Weeks    Status  On-going    Target Date  08/02/19        PT Long Term Goals - 08/15/19 1051      PT LONG TERM GOAL #1   Title  Patient will be able to perform stand x 5 in < 25 seconds to demonstrate improvement in functional mobility and reduced risk for falls.    Baseline  current 55 sec    Time  6    Period  Weeks    Status  On-going      PT LONG TERM GOAL #2   Title  Patient will be able to perform TUG in <15 seconds, using least restrictive assistive device, to demonstrate improvement in functional mobility and reduced risk for falls.    Baseline  current 65 sec with RW    Time  6    Period  Weeks    Status  On-going      PT LONG TERM GOAL #3   Title  Patient will have equal to or > 4/5 MMT throughout BLE to improve ability to perform functional mobility, stair ambulation and ADLs.    Baseline  See MMT    Time  6    Period  Weeks    Status  Achieved            Plan - 08/20/19 0943    Clinical Impression Statement  Patient demos steady progress. Patient contuse to be  limited by fatigue with functional activity. Added turns around cones today to improve patient ability to navigate turns and RLE proprioception. Patient required cueing for upright posture and RT foot placement. Patient requires frequent breaks  for rest due to fatigue. Patient will continue to benefit from skilled therapy services to progress tolerance to functional activity, gait and balance to improve functional mobility and reduce risk for falls.    Personal Factors and Comorbidities  Age;Comorbidity 2    Comorbidities  HX ov CVA, lumbar fracture    Examination-Activity Limitations  Bathing;Bed Mobility;Bend;Squat;Stairs;Carry;Stand;Toileting;Dressing;Transfers;Hygiene/Grooming;Lift;Locomotion Level    Examination-Participation Restrictions  Community Activity;Other;Cleaning    Stability/Clinical Decision Making  Stable/Uncomplicated    Rehab Potential  Fair    PT Frequency  2x / week    PT Duration  6 weeks    PT Treatment/Interventions  ADLs/Self Care Home Management;Aquatic Therapy;Biofeedback;Cryotherapy;Electrical Stimulation;Iontophoresis 4mg /ml Dexamethasone;Moist Heat;Traction;Balance training;Manual techniques;Therapeutic exercise;Vasopneumatic Device;Wheelchair mobility training;Therapeutic activities;Splinting;Taping;Functional mobility training;Stair training;Orthotic Fit/Training;Gait training;DME Instruction;Patient/family education;Passive range of motion;Dry needling;Energy conservation;Other (comment);Joint Manipulations;Spinal Manipulations;Compression bandaging;Neuromuscular re-education;Cognitive remediation;Ultrasound;Parrafin;Fluidtherapy;Contrast Bath    PT Next Visit Plan  continue with static balance and gait progression as tolerated. try sidestepping next visit    PT Home Exercise Plan  07/10/19: seated march, LAQs, seated heel/toe raise; 07/15/19: unsupported sitting 1-2 min rounds; 08/15/19: static standing at sink or counter    Consulted and Agree with Plan of Care  Patient;Family member/caregiver    Family Member Consulted  Daughter       Patient will benefit from skilled therapeutic intervention in order to improve the following deficits and impairments:  Abnormal gait, Decreased endurance, Hypomobility,  Impaired sensation, Decreased activity tolerance, Decreased strength, Impaired UE functional use, Decreased balance, Decreased mobility, Difficulty walking, Decreased coordination, Decreased safety awareness, Postural dysfunction  Visit Diagnosis: Other abnormalities of gait and mobility  Muscle weakness (generalized)     Problem List Patient Active Problem List   Diagnosis Date Noted  . Sundowning 07/04/2019  . Bilateral hearing loss 04/16/2019  . Urinary frequency 03/21/2019  . Chronic right shoulder pain 12/19/2018  . Weakness generalized 09/28/2018  . Palliative care encounter 09/28/2018  . Pressure injury of skin 08/27/2018  . COPD (chronic obstructive pulmonary disease) ([place]) 08/26/2018  . AMS (altered mental status) 08/22/2018  . Altered mental status 08/21/2018  . UTI (urinary tract infection) 08/21/2018  . Abnormality of gait 08/20/2018  . Prolonged Q-T interval on ECG   . Dysphagia, post-stroke   . Supplemental oxygen dependent   . Anxiety state   . Fatigue   . SOB (shortness of breath)   . Hyperglycemia   . History of lung cancer   . Depression   . CKD (chronic kidney disease), stage III   . Acute blood loss anemia   . Fall 07/16/2018  . 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 Acute spontaneous 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 ([place]) L thalamic 07/14/2018  . Cellulitis of arm, right 06/01/2018  . Rash and nonspecific skin eruption 06/01/2018  . Chest pain 02/02/2018  . Thrombocytopenia ([place]) 02/02/2018  . Hypokalemia 02/02/2018  . Pulmonary embolism ([place]) 02/02/2018  . Elevated troponin 02/02/2018  . Pulmonary embolus ([place]) 02/02/2018  . Non-ST elevation MI (NSTEMI) ([place]) 06/27/2017  . Diastolic CHF ([place]) 40/98/1191  . Chest pain due to CAD ([place]) 06/26/2017  . Dyspnea 06/21/2017  . Acute bronchitis 05/15/2017  . Oral candidiasis 05/15/2017  . Acute pericarditis   . Chest pain  at rest 12/20/2016  . Angina pectoris (  [place]) 12/20/2016  . Radiation pneumonitis ([place]) 11/09/2016  . Pleurisy 10/11/2016  . Chest wall pain 10/11/2016  . Atrial fibrillation ([place]) 05/21/2016  . Adenocarcinoma of right lung ([place]) 01/06/2016  . GAD (generalized anxiety disorder) 11/20/2015  . Essential hypertension   . Cellulitis 09/04/2015  . Cellulitis of left axilla   . Esophageal ulcer without bleeding   . Bilateral lower extremity edema 04/28/2014  . BPH (benign prostatic hyperplasia) 10/08/2012  . Anxiety   . EKG abnormalities 09/05/2012  . Tremor 09/05/2012  . Chronic fatigue 09/05/2012  . Arthritis   . Asthma, chronic   . Reflux esophagitis   . S/P CABG (coronary artery bypass graft)   . GERD (gastroesophageal reflux disease)   . Hyperlipidemia   . Hypertension   . UNSPECIFIED PERIPHERAL VASCULAR DISEASE 06/16/2009    9:47 AM, 08/20/19 Josue Hector PT DPT  Physical Therapist with [facility]  [phone] [place] [address] [place], [place], [address] Phone: [phone]   Fax:  [phone]  Name: Evan [name] MRN: [id number] Date of Birth: 15-May-1927

## 2019-08-22 ENCOUNTER — Other Ambulatory Visit: Payer: Self-pay

## 2019-08-22 ENCOUNTER — Encounter (HOSPITAL_COMMUNITY): Payer: Self-pay | Admitting: Specialist

## 2019-08-22 ENCOUNTER — Encounter (HOSPITAL_COMMUNITY): Payer: Self-pay | Admitting: Physical Therapy

## 2019-08-22 ENCOUNTER — Ambulatory Visit (HOSPITAL_COMMUNITY): Payer: Medicare HMO | Admitting: Physical Therapy

## 2019-08-22 ENCOUNTER — Ambulatory Visit (HOSPITAL_COMMUNITY): Payer: Medicare HMO | Admitting: Specialist

## 2019-08-22 DIAGNOSIS — M6281 Muscle weakness (generalized): Secondary | ICD-10-CM | POA: Diagnosis not present

## 2019-08-22 DIAGNOSIS — R2689 Other abnormalities of gait and mobility: Secondary | ICD-10-CM | POA: Diagnosis not present

## 2019-08-22 DIAGNOSIS — R278 Other lack of coordination: Secondary | ICD-10-CM

## 2019-08-22 DIAGNOSIS — R29898 Other symptoms and signs involving the musculoskeletal system: Secondary | ICD-10-CM

## 2019-08-22 NOTE — Therapy (Signed)
[place], [place], [address] Phone: [phone]   Fax:  [phone]  Occupational Therapy Treatment  Patient Details  Name: Evan [name] MRN: [id number] Date of Birth: [birth year] Referring Provider (OT): [name]   Encounter Date: 08/22/2019  OT End of Session - 08/22/19 0909    Visit Number  8    Number of Visits  13    Date for OT Re-Evaluation  09/10/19    Authorization Type  [employer] HMO    Authorization - Visit Number  8    Authorization - Number of Visits  12    Progress Note Due on Visit  10    OT Start Time  0820    OT Stop Time  0900    OT Time Calculation (min)  40 min    Activity Tolerance  Patient tolerated treatment well    Behavior During Therapy  [facility] for tasks assessed/performed       Past Medical History:  Diagnosis Date  . Adenocarcinoma of right lung ([place]) 01/06/2016  . Anginal chest pain at rest [facility])    Chronic non, controlled on Lorazepam  . Anxiety   . Arthritis    "all over"   . BPH (benign prostatic hyperplasia)   . CAD (coronary artery disease)   . Chronic lower back pain   . Complication of anesthesia    "problems making water afterwards"  . Depression   . DJD (degenerative joint disease)   . Esophageal ulcer without bleeding    bid ppi indefinitely  . Family history of adverse reaction to anesthesia    "children w/PONV"  . GERD (gastroesophageal reflux disease)   . Headache    "couple/week maybe" (09/04/2015)  . Hemochromatosis    Possible, elevated iron stores, hook-like osteophytes on hand films, normal LFTs  . Hepatitis    "yellow jaundice as a baby"  . History of ASCVD    MULTIVESSEL  . Hyperlipidemia   . Hypertension   . Osteoarthritis   . Pneumonia    "several times; got a little now" (09/04/2015)  . Rheumatoid arthritis ([place])   . Subdural hematoma (HCC)    small after fall 09/2016-plavix held, neurosurgery consulted.  Asymptomatic.    Marland Kitchen  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[place]) 02/2018   on Lovenox lifelong since he failed PO Eliquis    Past Surgical History:  Procedure Laterality Date  . ARM SKIN LESION BIOPSY / EXCISION Left 09/02/2015  . CATARACT EXTRACTION W/ INTRAOCULAR LENS  IMPLANT, BILATERAL Bilateral   . CHOLECYSTECTOMY OPEN    . CORNEAL TRANSPLANT Bilateral    "one at [facility]; one at [facility]"  . CORONARY ANGIOPLASTY WITH STENT PLACEMENT    . CORONARY ARTERY BYPASS GRAFT  1999  . DESCENDING AORTIC ANEURYSM REPAIR W/ STENT     DE stent ostium into the right radial free graft at OM-, 02-2007  . ESOPHAGOGASTRODUODENOSCOPY N/A 11/09/2014   Procedure: ESOPHAGOGASTRODUODENOSCOPY (EGD);  Surgeon: [name], MD;  Location: [facility] ENDOSCOPY;  Service: Endoscopy;  Laterality: N/A;  . INGUINAL HERNIA REPAIR    . JOINT REPLACEMENT    . LEFT HEART CATH AND CORS/GRAFTS ANGIOGRAPHY N/A 06/27/2017   Procedure: LEFT HEART CATH AND CORS/GRAFTS ANGIOGRAPHY;  Surgeon: [name], MD;  Location: [place] CV LAB;  Service: Cardiovascular;  Laterality: N/A;  . NASAL SINUS SURGERY    . SHOULDER OPEN ROTATOR CUFF REPAIR Right   . TOTAL KNEE ARTHROPLASTY Bilateral     There  were no vitals filed for this visit.  Subjective Assessment - 08/22/19 0906    Subjective   S:  We have tried having him dress himself some.  My arm hurts when I move it.  I cant explain why.    Currently in Pain?  No/denies    Pain Score  4     Pain Location  Arm    Pain Orientation  Right    Pain Descriptors / Indicators  Aching         [facility] OT Assessment - 08/22/19 0001      Assessment   Medical Diagnosis  Weakness    Referring Provider (OT)  [name]               OT Treatments/Exercises (OP) - 08/22/19 0001      Neurological Re-education Exercises   Scapular Stabilization  Seated;10 reps    Other Information  completed protraction, and proximal shoulder strengthening sequence with rest break after each movement in sequence    Other Exercises 1   paitent worked on picking up sponge square with pincer grasp, moving to palm and picking up another sponge.  patient had max difficulty with this task due to decreased gmc.  with facilitation at elbow for stabilization, patient able to pick up 4-5 blocks.  patient them picked up handfuls of 1-2 blocks and reached forward to place in bucket with min difficulty    Other Exercises 2  patient able to place 7 large pegs in pegboard, picking up 5/7 pegs from therapists hand and 2/7 from table and manipulating peg in hand.  Patient had max difficulty placing pegs in pegboard due to decreased gmc.  patient then removed pegs and placed on box lid without difficulty although movement was discoordinated.       Functional Reaching Activities   Mid Level  seated at table holding boom whacker in right hand patient reached to varying heights from knee level, to shoulder height on both right and left side of body to hit boom whacker held my therapist.  required cuing to maintain grasp on boomwhacker while reaching, as he was dropping boomwhacker often.  with cuing he significantly improved his accuracy and had only one drop of boomwhacker.       Manual Therapy   Manual Therapy  Soft tissue mobilization    Manual therapy comments  manual therapy completed seperately from all other intervention     Soft tissue mobilization  mobilized scapular region to decrease stiffness and improve movement of scapula with reaching.              OT Education - 08/22/19 [phone]    Education Details  educated on importance of practicing dressing and practicing use of right hand and arm to pick up smaller objects and move them from point a to b for improved [facility].    Person(s) Educated  Patient;Child(ren)    Methods  Explanation;Demonstration    Comprehension  Returned demonstration;Verbalized understanding       OT Short Term Goals - 08/01/19 1310      OT SHORT TERM GOAL #1   Title  Patient and family will be educated  and verbalize independence with HEP in order to increase functional use of his right hand while using for 25% or more of daily tasks.    Time  6    Period  Weeks    Status  On-going    Target Date  09/10/19  OT SHORT TERM GOAL #2   Title  Patient will increase scapular and proximal shoulder strengthening while demonstrating increased shoulder endurance during a sustained functional reaching task for more than 1 minute.    Time  6    Period  Weeks    Status  On-going      OT SHORT TERM GOAL #3   Title  Patient will increase gross motor coordination by moving 10 or more blocks during the blocks and box test allow him to complete self feeding tasks with less difficulty.    Time  6    Period  Weeks    Status  On-going      OT SHORT TERM GOAL #4   Title  Patient will increase his right hand grip strength by 6# and his pinch strength by 3# in order to hold his utensil during meals without dropping or fatigue.    Time  6    Period  Weeks    Status  On-going      OT SHORT TERM GOAL #5   Title  Patient will be educated on adaptive eqipment/strategies that may help improve overall motor control when completing basic self care tasks.    Time  6    Period  Weeks    Status  On-going               Plan - 08/22/19 0909    Clinical Impression Statement  A:  Patient engaged throughout treatment.  Treatment focus was on [place], [facility], and functional reaching tasks.  Attempted use of cuff weight to improve accuracy of RUE movement with reaching and picking up objects, did not seem to improve.  Stabilization of elbow on table did provide slight improvement.  With cuing patient had significant improvement in accuracy of maintaining grip on object while reaching.    Body Structure / Function / Physical Skills  ADL;UE functional use;FMC;GMC;Coordination;IADL;Dexterity;Strength    Plan  P:  Continue with shoulder stability, gross motor, reaching, in hand manipulation and fine motor tasks as applied  to functional tasks.       Patient will benefit from skilled therapeutic intervention in order to improve the following deficits and impairments:   Body Structure / Function / Physical Skills: ADL, UE functional use, FMC, GMC, Coordination, IADL, Dexterity, Strength       Visit Diagnosis: Other lack of coordination  Other symptoms and signs involving the musculoskeletal system    Problem List Patient Active Problem List   Diagnosis Date Noted  . Sundowning 07/04/2019  . Bilateral hearing loss 04/16/2019  . Urinary frequency 03/21/2019  . Chronic right shoulder pain 12/19/2018  . Weakness generalized 09/28/2018  . Palliative care encounter 09/28/2018  . Pressure injury of skin 08/27/2018  . COPD (chronic obstructive pulmonary disease) ([place]) 08/26/2018  . AMS (altered mental status) 08/22/2018  . Altered mental status 08/21/2018  . UTI (urinary tract infection) 08/21/2018  . Abnormality of gait 08/20/2018  . Prolonged Q-T interval on ECG   . Dysphagia, post-stroke   . Supplemental oxygen dependent   . Anxiety state   . Fatigue   . SOB (shortness of breath)   . Hyperglycemia   . History of lung cancer   . Depression   . CKD (chronic kidney disease), stage III   . Acute blood loss anemia   . Fall 07/16/2018  . 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 Acute spontaneous 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 (HCC) L  thalamic 07/14/2018  . Cellulitis of arm, right 06/01/2018  . Rash and nonspecific skin eruption 06/01/2018  . Chest pain 02/02/2018  . Thrombocytopenia ([place]) 02/02/2018  . Hypokalemia 02/02/2018  . Pulmonary embolism ([place]) 02/02/2018  . Elevated troponin 02/02/2018  . Pulmonary embolus ([place]) 02/02/2018  . Non-ST elevation MI (NSTEMI) ([place]) 06/27/2017  . Diastolic CHF ([place]) 19/37/9024  . Chest pain due to CAD ([place]) 06/26/2017  . Dyspnea 06/21/2017  . Acute bronchitis 05/15/2017  .  Oral candidiasis 05/15/2017  . Acute pericarditis   . Chest pain at rest 12/20/2016  . Angina pectoris ([place]) 12/20/2016  . Radiation pneumonitis ([place]) 11/09/2016  . Pleurisy 10/11/2016  . Chest wall pain 10/11/2016  . Atrial fibrillation ([place]) 05/21/2016  . Adenocarcinoma of right lung ([place]) 01/06/2016  . GAD (generalized anxiety disorder) 11/20/2015  . Essential hypertension   . Cellulitis 09/04/2015  . Cellulitis of left axilla   . Esophageal ulcer without bleeding   . Bilateral lower extremity edema 04/28/2014  . BPH (benign prostatic hyperplasia) 10/08/2012  . Anxiety   . EKG abnormalities 09/05/2012  . Tremor 09/05/2012  . Chronic fatigue 09/05/2012  . Arthritis   . Asthma, chronic   . Reflux esophagitis   . S/P CABG (coronary artery bypass graft)   . GERD (gastroesophageal reflux disease)   . Hyperlipidemia   . Hypertension   . UNSPECIFIED PERIPHERAL VASCULAR DISEASE 06/16/2009    Vangie Bicker, [place], OTR/L [phone]  08/22/2019, 9:20 AM  [place] [address] [place], [place], [address] Phone: [phone]   Fax:  [phone]  Name: Evan [name] MRN: [id number] Date of Birth: January 21, 1928

## 2019-08-22 NOTE — Therapy (Signed)
Evan [name], [place], [address] Phone: [phone]   Fax:  [phone]  Physical Therapy Treatment  Patient Details  Name: Evan [name] MRN: [id number] Date of Birth: 1927/09/29 Referring Provider (PT): [name] MD    Encounter Date: 08/22/2019  PT End of Session - 08/22/19 0904    Visit Number  12    Number of Visits  20    Date for PT Re-Evaluation  08/23/19    Authorization Type  [employer] Medicare (submitted for 8 visit extension on 08/15/19)    Authorization Time Period  07/10/19-08/23/19    Authorization - Number of Visits  12    Progress Note Due on Visit  20    PT Start Time  0902    PT Stop Time  0942    PT Time Calculation (min)  40 min    Equipment Utilized During Treatment  Gait belt    Activity Tolerance  Patient limited by fatigue    Behavior During Therapy  [facility] for tasks assessed/performed       Past Medical History:  Diagnosis Date  . Adenocarcinoma of right lung ([place]) 01/06/2016  . Anginal chest pain at rest [facility])    Chronic non, controlled on Lorazepam  . Anxiety   . Arthritis    "all over"   . BPH (benign prostatic hyperplasia)   . CAD (coronary artery disease)   . Chronic lower back pain   . Complication of anesthesia    "problems making water afterwards"  . Depression   . DJD (degenerative joint disease)   . Esophageal ulcer without bleeding    bid ppi indefinitely  . Family history of adverse reaction to anesthesia    "children w/PONV"  . GERD (gastroesophageal reflux disease)   . Headache    "couple/week maybe" (09/04/2015)  . Hemochromatosis    Possible, elevated iron stores, hook-like osteophytes on hand films, normal LFTs  . Hepatitis    "yellow jaundice as a baby"  . History of ASCVD    MULTIVESSEL  . Hyperlipidemia   . Hypertension   . Osteoarthritis   . Pneumonia    "several times; got a little now" (09/04/2015)  . Rheumatoid arthritis ([place])   . Subdural hematoma  (HCC)    small after fall 09/2016-plavix held, neurosurgery consulted.  Asymptomatic.    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 ARM SKIN LESION BIOPSY / EXCISION Left 09/02/2015  . CATARACT EXTRACTION W/ INTRAOCULAR LENS  IMPLANT, BILATERAL Bilateral   . CHOLECYSTECTOMY OPEN    . CORNEAL TRANSPLANT Bilateral    "one at [facility]; one at [facility]"  . CORONARY ANGIOPLASTY WITH STENT PLACEMENT    . CORONARY ARTERY BYPASS GRAFT  1999  . DESCENDING AORTIC ANEURYSM REPAIR W/ STENT     DE stent ostium into the right radial free graft at OM-, 02-2007  . ESOPHAGOGASTRODUODENOSCOPY N/A 11/09/2014   Procedure: ESOPHAGOGASTRODUODENOSCOPY (EGD);  Surgeon: [name], MD;  Location: [facility] ENDOSCOPY;  Service: Endoscopy;  Laterality: N/A;  . INGUINAL HERNIA REPAIR    . JOINT REPLACEMENT    . LEFT HEART CATH AND CORS/GRAFTS ANGIOGRAPHY N/A 06/27/2017   Procedure: LEFT HEART CATH AND CORS/GRAFTS ANGIOGRAPHY;  Surgeon: [name], MD;  Location: [place] CV LAB;  Service: Cardiovascular;  Laterality: N/A;  . NASAL SINUS SURGERY    . SHOULDER OPEN  ROTATOR CUFF REPAIR Right   . TOTAL KNEE ARTHROPLASTY Bilateral     There were no vitals filed for this visit.  Subjective Assessment - 08/22/19 0904    Subjective  Patient says he is alright today. Says his legs are a little tired.    Patient is accompained by:  Family member    Pertinent History  CVA effecting RT side on 07/13/18, lumbar fracture December 2021    Patient Stated Goals  Be able to walk    Currently in Pain?  No/denies                        [facility] Adult PT Treatment/Exercise - 08/22/19 0001      Knee/Hip Exercises: Standing   Gait Training  175 feet with RW and WC follow, rest break x 1    Other Standing Knee Exercises  turn around cone 6' apart, 2 RT ; standing balance 60" unsupported    Other Standing Knee Exercises   sidestepping at // bars 2RT, rest break x 1       Knee/Hip Exercises: Seated   Sit to Sand  2 sets;5 reps;with UE support               PT Short Term Goals - 08/15/19 1051      PT SHORT TERM GOAL #1   Title  Patient will be independent with initial HEP to improve functional outcomes    Baseline  Reports compliance    Time  3    Period  Weeks    Status  Achieved    Target Date  08/02/19      PT SHORT TERM GOAL #2   Title  Patient will be able to perform stand x 5 in < 40 seconds to demonstrate improvement in functional mobility and reduced risk for falls.    Baseline  current 55 sec    Time  3    Period  Weeks    Status  On-going    Target Date  08/02/19        PT Long Term Goals - 08/15/19 1051      PT LONG TERM GOAL #1   Title  Patient will be able to perform stand x 5 in < 25 seconds to demonstrate improvement in functional mobility and reduced risk for falls.    Baseline  current 55 sec    Time  6    Period  Weeks    Status  On-going      PT LONG TERM GOAL #2   Title  Patient will be able to perform TUG in <15 seconds, using least restrictive assistive device, to demonstrate improvement in functional mobility and reduced risk for falls.    Baseline  current 65 sec with RW    Time  6    Period  Weeks    Status  On-going      PT LONG TERM GOAL #3   Title  Patient will have equal to or > 4/5 MMT throughout BLE to improve ability to perform functional mobility, stair ambulation and ADLs.    Baseline  See MMT    Time  6    Period  Weeks    Status  Achieved            Plan - 08/22/19 0947    Clinical Impression Statement  Patient shows improvement in tolerance to activity today. Patient able to increase ambulation distance, as well as unsupported  standing time. Added sidestepping at parallel bars. Patient educated on purpose and function and required verbal cues for RLE coordination and foot placement. Patient with improved return for avoiding RLE  inversion during gait training today. Patient continues to be limited by mild fatigue with activity, and requires several breaks for rest, though this was improved today. Patient will continue to benefit from skilled therapy services to progress balance and tolerance to activity to improve functional mobility and reduce risk for falls.    Personal Factors and Comorbidities  Age;Comorbidity 2    Comorbidities  HX ov CVA, lumbar fracture    Examination-Activity Limitations  Bathing;Bed Mobility;Bend;Squat;Stairs;Carry;Stand;Toileting;Dressing;Transfers;Hygiene/Grooming;Lift;Locomotion Level    Examination-Participation Restrictions  Community Activity;Other;Cleaning    Stability/Clinical Decision Making  Stable/Uncomplicated    Rehab Potential  Fair    PT Frequency  2x / week    PT Duration  6 weeks    PT Treatment/Interventions  ADLs/Self Care Home Management;Aquatic Therapy;Biofeedback;Cryotherapy;Electrical Stimulation;Iontophoresis 4mg /ml Dexamethasone;Moist Heat;Traction;Balance training;Manual techniques;Therapeutic exercise;Vasopneumatic Device;Wheelchair mobility training;Therapeutic activities;Splinting;Taping;Functional mobility training;Stair training;Orthotic Fit/Training;Gait training;DME Instruction;Patient/family education;Passive range of motion;Dry needling;Energy conservation;Other (comment);Joint Manipulations;Spinal Manipulations;Compression bandaging;Neuromuscular re-education;Cognitive remediation;Ultrasound;Parrafin;Fluidtherapy;Contrast Bath    PT Next Visit Plan  continue with static balance and gait progression as tolerated. Add step ups next visit    PT Home Exercise Plan  07/10/19: seated march, LAQs, seated heel/toe raise; 07/15/19: unsupported sitting 1-2 min rounds; 08/15/19: static standing at sink or counter    Consulted and Agree with Plan of Care  Patient;Family member/caregiver    Family Member Consulted  Daughter       Patient will benefit from skilled therapeutic  intervention in order to improve the following deficits and impairments:  Abnormal gait, Decreased endurance, Hypomobility, Impaired sensation, Decreased activity tolerance, Decreased strength, Impaired UE functional use, Decreased balance, Decreased mobility, Difficulty walking, Decreased coordination, Decreased safety awareness, Postural dysfunction  Visit Diagnosis: Other abnormalities of gait and mobility  Muscle weakness (generalized)     Problem List Patient Active Problem List   Diagnosis Date Noted  . Sundowning 07/04/2019  . Bilateral hearing loss 04/16/2019  . Urinary frequency 03/21/2019  . Chronic right shoulder pain 12/19/2018  . Weakness generalized 09/28/2018  . Palliative care encounter 09/28/2018  . Pressure injury of skin 08/27/2018  . COPD (chronic obstructive pulmonary disease) ([place]) 08/26/2018  . AMS (altered mental status) 08/22/2018  . Altered mental status 08/21/2018  . UTI (urinary tract infection) 08/21/2018  . Abnormality of gait 08/20/2018  . Prolonged Q-T interval on ECG   . Dysphagia, post-stroke   . Supplemental oxygen dependent   . Anxiety state   . Fatigue   . SOB (shortness of breath)   . Hyperglycemia   . History of lung cancer   . Depression   . CKD (chronic kidney disease), stage III   . Acute blood loss anemia   . Fall 07/16/2018  . 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 Acute spontaneous 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 ([place]) L thalamic 07/14/2018  . Cellulitis of arm, right 06/01/2018  . Rash and nonspecific skin eruption 06/01/2018  . Chest pain 02/02/2018  . Thrombocytopenia ([place]) 02/02/2018  . Hypokalemia 02/02/2018  . Pulmonary embolism ([place]) 02/02/2018  . Elevated troponin 02/02/2018  . Pulmonary embolus ([place]) 02/02/2018  . Non-ST elevation MI (NSTEMI) ([place]) 06/27/2017  . Diastolic CHF ([place]) 97/05/6376  . Chest pain due to CAD ([place]) 06/26/2017   . Dyspnea 06/21/2017  . Acute bronchitis 05/15/2017  . Oral candidiasis 05/15/2017  .  Acute pericarditis   . Chest pain at rest 12/20/2016  . Angina pectoris ([place]) 12/20/2016  . Radiation pneumonitis ([place]) 11/09/2016  . Pleurisy 10/11/2016  . Chest wall pain 10/11/2016  . Atrial fibrillation ([place]) 05/21/2016  . Adenocarcinoma of right lung ([place]) 01/06/2016  . GAD (generalized anxiety disorder) 11/20/2015  . Essential hypertension   . Cellulitis 09/04/2015  . Cellulitis of left axilla   . Esophageal ulcer without bleeding   . Bilateral lower extremity edema 04/28/2014  . BPH (benign prostatic hyperplasia) 10/08/2012  . Anxiety   . EKG abnormalities 09/05/2012  . Tremor 09/05/2012  . Chronic fatigue 09/05/2012  . Arthritis   . Asthma, chronic   . Reflux esophagitis   . S/P CABG (coronary artery bypass graft)   . GERD (gastroesophageal reflux disease)   . Hyperlipidemia   . Hypertension   . UNSPECIFIED PERIPHERAL VASCULAR DISEASE 06/16/2009   9:48 AM, 08/22/19 Josue Hector PT DPT  Physical Therapist with [facility]  [phone] [place] [address] [place], [place], [address] Phone: [phone]   Fax:  [phone]  Name: TREON KEHL MRN: [id number] Date of Birth: 08/14/1927

## 2019-08-27 ENCOUNTER — Encounter (HOSPITAL_COMMUNITY): Payer: Self-pay | Admitting: Physical Therapy

## 2019-08-27 ENCOUNTER — Other Ambulatory Visit: Payer: Self-pay

## 2019-08-27 ENCOUNTER — Ambulatory Visit (HOSPITAL_COMMUNITY): Payer: Medicare HMO | Admitting: Physical Therapy

## 2019-08-27 DIAGNOSIS — M6281 Muscle weakness (generalized): Secondary | ICD-10-CM

## 2019-08-27 DIAGNOSIS — R29898 Other symptoms and signs involving the musculoskeletal system: Secondary | ICD-10-CM | POA: Diagnosis not present

## 2019-08-27 DIAGNOSIS — R278 Other lack of coordination: Secondary | ICD-10-CM | POA: Diagnosis not present

## 2019-08-27 DIAGNOSIS — R2689 Other abnormalities of gait and mobility: Secondary | ICD-10-CM

## 2019-08-27 NOTE — Therapy (Signed)
[place], [place], [address] Phone: [phone]   Fax:  [phone]  Physical Therapy Treatment  Patient Details  Name: Evan [name] MRN: [id number] Date of Birth: [birth year] Referring Provider (PT): [name] MD    Encounter Date: 08/27/2019  PT End of Session - 08/27/19 0910    Visit Number  13    Number of Visits  20    Date for PT Re-Evaluation  09/20/19    Authorization Type  [employer] (submitted for 8 visit extension on 08/15/19)    Authorization Time Period  08/23/19-09/20/19    Authorization - Visit Number  1    Authorization - Number of Visits  8    Progress Note Due on Visit  20    PT Start Time  0905    PT Stop Time  0945    PT Time Calculation (min)  40 min    Equipment Utilized During Treatment  Gait belt    Activity Tolerance  Patient limited by fatigue    Behavior During Therapy  [facility] for tasks assessed/performed       Past Medical History:  Diagnosis Date  . Adenocarcinoma of right lung ([place]) 01/06/2016  . Anginal chest pain at rest [facility])    Chronic non, controlled on Lorazepam  . Anxiety   . Arthritis    "all over"   . BPH (benign prostatic hyperplasia)   . CAD (coronary artery disease)   . Chronic lower back pain   . Complication of anesthesia    "problems making water afterwards"  . Depression   . DJD (degenerative joint disease)   . Esophageal ulcer without bleeding    bid ppi indefinitely  . Family history of adverse reaction to anesthesia    "children w/PONV"  . GERD (gastroesophageal reflux disease)   . Headache    "couple/week maybe" (09/04/2015)  . Hemochromatosis    Possible, elevated iron stores, hook-like osteophytes on hand films, normal LFTs  . Hepatitis    "yellow jaundice as a baby"  . History of ASCVD    MULTIVESSEL  . Hyperlipidemia   . Hypertension   . Osteoarthritis   . Pneumonia    "several times; got a little now" (09/04/2015)  . Rheumatoid  arthritis ([place])   . Subdural hematoma (H[facility])    small after fall 09/2016-plavix held, neurosurgery consulted.  Asymptomatic.    Marland Kitchen 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[place]) 02/2018   on Lovenox lifelong since he failed PO Eliquis    Past Surgical History:  Procedure Laterality Date  . ARM SKIN LESION BIOPSY / EXCISION Left 09/02/2015  . CATARACT EXTRACTION W/ INTRAOCULAR LENS  IMPLANT, BILATERAL Bilateral   . CHOLECYSTECTOMY OPEN    . CORNEAL TRANSPLANT Bilateral    "one at [facility]; one at [facility]"  . CORONARY ANGIOPLASTY WITH STENT PLACEMENT    . CORONARY ARTERY BYPASS GRAFT  1999  . DESCENDING AORTIC ANEURYSM REPAIR W/ STENT     DE stent ostium into the right radial free graft at OM-, 02-2007  . ESOPHAGOGASTRODUODENOSCOPY N/A 11/09/2014   Procedure: ESOPHAGOGASTRODUODENOSCOPY (EGD);  Surgeon: [name], MD;  Location: [facility] ENDOSCOPY;  Service: Endoscopy;  Laterality: N/A;  . INGUINAL HERNIA REPAIR    . JOINT REPLACEMENT    . LEFT HEART CATH AND CORS/GRAFTS ANGIOGRAPHY N/A 06/27/2017   Procedure: LEFT HEART CATH AND CORS/GRAFTS ANGIOGRAPHY;  Surgeon: [name], MD;  Location: [place] CV LAB;  Service: Cardiovascular;  Laterality: N/A;  .  NASAL SINUS SURGERY    . SHOULDER OPEN ROTATOR CUFF REPAIR Right   . TOTAL KNEE ARTHROPLASTY Bilateral     There were no vitals filed for this visit.  Subjective Assessment - 08/27/19 0908    Subjective  Patient reports no new issues. Says his legs are aching today, "about a 6"    Patient is accompained by:  Family member    Pertinent History  CVA effecting RT side on 07/13/18, lumbar fracture December 2021    Patient Stated Goals  Be able to walk    Currently in Pain?  Yes    Pain Score  6     Pain Location  Leg    Pain Orientation  Right;Left    Pain Descriptors / Indicators  Aching    Pain Type  Chronic pain    Pain Onset  More than a month ago    Pain Frequency  Intermittent                        [facility] Adult PT Treatment/Exercise -  08/27/19 0001      Knee/Hip Exercises: Standing   Gait Training  175 feet with RW and WC follow, rest break x 2    Other Standing Knee Exercises  standing cone taps wiht RLE x 3 rounds 3 cones with HHA     Other Standing Knee Exercises  sidestepping at // bars 2RT       Knee/Hip Exercises: Seated   Sit to Sand  2 sets;5 reps;with UE support               PT Short Term Goals - 08/15/19 1051      PT SHORT TERM GOAL #1   Title  Patient will be independent with initial HEP to improve functional outcomes    Baseline  Reports compliance    Time  3    Period  Weeks    Status  Achieved    Target Date  08/02/19      PT SHORT TERM GOAL #2   Title  Patient will be able to perform stand x 5 in < 40 seconds to demonstrate improvement in functional mobility and reduced risk for falls.    Baseline  current 55 sec    Time  3    Period  Weeks    Status  On-going    Target Date  08/02/19        PT Long Term Goals - 08/15/19 1051      PT LONG TERM GOAL #1   Title  Patient will be able to perform stand x 5 in < 25 seconds to demonstrate improvement in functional mobility and reduced risk for falls.    Baseline  current 55 sec    Time  6    Period  Weeks    Status  On-going      PT LONG TERM GOAL #2   Title  Patient will be able to perform TUG in <15 seconds, using least restrictive assistive device, to demonstrate improvement in functional mobility and reduced risk for falls.    Baseline  current 65 sec with RW    Time  6    Period  Weeks    Status  On-going      PT LONG TERM GOAL #3   Title  Patient will have equal to or > 4/5 MMT throughout BLE to improve ability to perform functional mobility, stair ambulation and ADLs.  Baseline  See MMT    Time  6    Period  Weeks    Status  Achieved            Plan - 08/27/19 0945    Clinical Impression Statement  Patient limited by increased fatigue today. Patient able to ambulate same distance as last session but with 1  more rest break. Patient continues to have difficulty with coordination of RLE during gait training, and requires frequent cues for avoiding inversion, especially when fatigued. Patient also demos slight increase in RLE and RUE spasticity with gait today, and required cues for RT hand grip using walker. Added cone taps standing in parallel bars for practice of RLE coordination in standing. Patient with noted fatigue at end of session. Patient will continue to benefit from skilled therapy services to progress tolerance to functional activity and balance to improve LOF with ADLs.    Personal Factors and Comorbidities  Age;Comorbidity 2    Comorbidities  HX ov CVA, lumbar fracture    Examination-Activity Limitations  Bathing;Bed Mobility;Bend;Squat;Stairs;Carry;Stand;Toileting;Dressing;Transfers;Hygiene/Grooming;Lift;Locomotion Level    Examination-Participation Restrictions  Community Activity;Other;Cleaning    Stability/Clinical Decision Making  Stable/Uncomplicated    Rehab Potential  Fair    PT Frequency  2x / week    PT Duration  6 weeks    PT Treatment/Interventions  ADLs/Self Care Home Management;Aquatic Therapy;Biofeedback;Cryotherapy;Electrical Stimulation;Iontophoresis 4mg /ml Dexamethasone;Moist Heat;Traction;Balance training;Manual techniques;Therapeutic exercise;Vasopneumatic Device;Wheelchair mobility training;Therapeutic activities;Splinting;Taping;Functional mobility training;Stair training;Orthotic Fit/Training;Gait training;DME Instruction;Patient/family education;Passive range of motion;Dry needling;Energy conservation;Other (comment);Joint Manipulations;Spinal Manipulations;Compression bandaging;Neuromuscular re-education;Cognitive remediation;Ultrasound;Parrafin;Fluidtherapy;Contrast Bath    PT Next Visit Plan  continue with static balance and gait progression as tolerated. Add step ups next visit    PT Home Exercise Plan  07/10/19: seated march, LAQs, seated heel/toe raise; 07/15/19:  unsupported sitting 1-2 min rounds; 08/15/19: static standing at sink or counter    Consulted and Agree with Plan of Care  Patient;Family member/caregiver    Family Member Consulted  Daughter       Patient will benefit from skilled therapeutic intervention in order to improve the following deficits and impairments:  Abnormal gait, Decreased endurance, Hypomobility, Impaired sensation, Decreased activity tolerance, Decreased strength, Impaired UE functional use, Decreased balance, Decreased mobility, Difficulty walking, Decreased coordination, Decreased safety awareness, Postural dysfunction  Visit Diagnosis: Other abnormalities of gait and mobility  Muscle weakness (generalized)     Problem List Patient Active Problem List   Diagnosis Date Noted  . Sundowning 07/04/2019  . Bilateral hearing loss 04/16/2019  . Urinary frequency 03/21/2019  . Chronic right shoulder pain 12/19/2018  . Weakness generalized 09/28/2018  . Palliative care encounter 09/28/2018  . Pressure injury of skin 08/27/2018  . COPD (chronic obstructive pulmonary disease) ([place]) 08/26/2018  . AMS (altered mental status) 08/22/2018  . Altered mental status 08/21/2018  . UTI (urinary tract infection) 08/21/2018  . Abnormality of gait 08/20/2018  . Prolonged Q-T interval on ECG   . Dysphagia, post-stroke   . Supplemental oxygen dependent   . Anxiety state   . Fatigue   . SOB (shortness of breath)   . Hyperglycemia   . History of lung cancer   . Depression   . CKD (chronic kidney disease), stage III   . Acute blood loss anemia   . Fall 07/16/2018  . 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 Acute spontaneous 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 ([place]) L thalamic 07/14/2018  . Cellulitis of arm, right 06/01/2018  . Rash and nonspecific skin  eruption 06/01/2018  . Chest pain 02/02/2018  . Thrombocytopenia ([place]) 02/02/2018  . Hypokalemia  02/02/2018  . Pulmonary embolism ([place]) 02/02/2018  . Elevated troponin 02/02/2018  . Pulmonary embolus ([place]) 02/02/2018  . Non-ST elevation MI (NSTEMI) ([place]) 06/27/2017  . Diastolic CHF ([place]) 82/64/1583  . Chest pain due to CAD ([place]) 06/26/2017  . Dyspnea 06/21/2017  . Acute bronchitis 05/15/2017  . Oral candidiasis 05/15/2017  . Acute pericarditis   . Chest pain at rest 12/20/2016  . Angina pectoris ([place]) 12/20/2016  . Radiation pneumonitis ([place]) 11/09/2016  . Pleurisy 10/11/2016  . Chest wall pain 10/11/2016  . Atrial fibrillation ([place]) 05/21/2016  . Adenocarcinoma of right lung ([place]) 01/06/2016  . GAD (generalized anxiety disorder) 11/20/2015  . Essential hypertension   . Cellulitis 09/04/2015  . Cellulitis of left axilla   . Esophageal ulcer without bleeding   . Bilateral lower extremity edema 04/28/2014  . BPH (benign prostatic hyperplasia) 10/08/2012  . Anxiety   . EKG abnormalities 09/05/2012  . Tremor 09/05/2012  . Chronic fatigue 09/05/2012  . Arthritis   . Asthma, chronic   . Reflux esophagitis   . S/P CABG (coronary artery bypass graft)   . GERD (gastroesophageal reflux disease)   . Hyperlipidemia   . Hypertension   . UNSPECIFIED PERIPHERAL VASCULAR DISEASE 06/16/2009    10:49 AM, 08/27/19 Josue Hector PT DPT  Physical Therapist with [facility]  [phone] [place] [address] [place], [place], [address] Phone: [phone]   Fax:  [phone]  Name: Evan [name] MRN: [id number] Date of Birth: March 18, 1928

## 2019-08-29 ENCOUNTER — Encounter (HOSPITAL_COMMUNITY): Payer: Self-pay

## 2019-08-29 ENCOUNTER — Ambulatory Visit (HOSPITAL_COMMUNITY): Payer: Medicare HMO

## 2019-08-29 ENCOUNTER — Other Ambulatory Visit: Payer: Self-pay

## 2019-08-29 DIAGNOSIS — M6281 Muscle weakness (generalized): Secondary | ICD-10-CM | POA: Diagnosis not present

## 2019-08-29 DIAGNOSIS — R278 Other lack of coordination: Secondary | ICD-10-CM | POA: Diagnosis not present

## 2019-08-29 DIAGNOSIS — R29898 Other symptoms and signs involving the musculoskeletal system: Secondary | ICD-10-CM | POA: Diagnosis not present

## 2019-08-29 DIAGNOSIS — R2689 Other abnormalities of gait and mobility: Secondary | ICD-10-CM | POA: Diagnosis not present

## 2019-08-29 NOTE — Therapy (Signed)
[place], [place], [address] Phone: [phone]   Fax:  [phone]  Physical Therapy Treatment  Patient Details  Name: Evan [name] MRN: [id number] Date of Birth: Nov 20, 1927 Referring Provider (PT): [name] MD    Encounter Date: 08/29/2019  PT End of Session - 08/29/19 1023    Visit Number  14    Number of Visits  20    Date for PT Re-Evaluation  09/20/19    Authorization Type  [employer] Medicare (submitted for 8 visit extension on 08/15/19)    Authorization Time Period  08/23/19-09/20/19    Authorization - Visit Number  2    Authorization - Number of Visits  8    Progress Note Due on Visit  20    PT Start Time  1003    PT Stop Time  1042    PT Time Calculation (min)  39 min    Equipment Utilized During Treatment  Gait belt    Activity Tolerance  Patient limited by fatigue    Behavior During Therapy  [facility] for tasks assessed/performed       Past Medical History:  Diagnosis Date  . Adenocarcinoma of right lung ([place]) 01/06/2016  . Anginal chest pain at rest [facility])    Chronic non, controlled on Lorazepam  . Anxiety   . Arthritis    "all over"   . BPH (benign prostatic hyperplasia)   . CAD (coronary artery disease)   . Chronic lower back pain   . Complication of anesthesia    "problems making water afterwards"  . Depression   . DJD (degenerative joint disease)   . Esophageal ulcer without bleeding    bid ppi indefinitely  . Family history of adverse reaction to anesthesia    "children w/PONV"  . GERD (gastroesophageal reflux disease)   . Headache    "couple/week maybe" (09/04/2015)  . Hemochromatosis    Possible, elevated iron stores, hook-like osteophytes on hand films, normal LFTs  . Hepatitis    "yellow jaundice as a baby"  . History of ASCVD    MULTIVESSEL  . Hyperlipidemia   . Hypertension   . Osteoarthritis   . Pneumonia    "several times; got a little now" (09/04/2015)  . Rheumatoid  arthritis ([place])   . Subdural hematoma ([facility])    small after fall 09/2016-plavix held, neurosurgery consulted.  Asymptomatic.    Marland Kitchen 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[place]) 02/2018   on Lovenox lifelong since he failed PO Eliquis    Past Surgical History:  Procedure Laterality Date  . ARM SKIN LESION BIOPSY / EXCISION Left 09/02/2015  . CATARACT EXTRACTION W/ INTRAOCULAR LENS  IMPLANT, BILATERAL Bilateral   . CHOLECYSTECTOMY OPEN    . CORNEAL TRANSPLANT Bilateral    "one at [facility]; one at [facility]"  . CORONARY ANGIOPLASTY WITH STENT PLACEMENT    . CORONARY ARTERY BYPASS GRAFT  1999  . DESCENDING AORTIC ANEURYSM REPAIR W/ STENT     DE stent ostium into the right radial free graft at OM-, 02-2007  . ESOPHAGOGASTRODUODENOSCOPY N/A 11/09/2014   Procedure: ESOPHAGOGASTRODUODENOSCOPY (EGD);  Surgeon: [name], MD;  Location: [facility] ENDOSCOPY;  Service: Endoscopy;  Laterality: N/A;  . INGUINAL HERNIA REPAIR    . JOINT REPLACEMENT    . LEFT HEART CATH AND CORS/GRAFTS ANGIOGRAPHY N/A 06/27/2017   Procedure: LEFT HEART CATH AND CORS/GRAFTS ANGIOGRAPHY;  Surgeon: [name], MD;  Location: [place] CV LAB;  Service: Cardiovascular;  Laterality: N/A;  .  NASAL SINUS SURGERY    . SHOULDER OPEN ROTATOR CUFF REPAIR Right   . TOTAL KNEE ARTHROPLASTY Bilateral     There were no vitals filed for this visit.  Subjective Assessment - 08/29/19 1021    Subjective  Pt arrived with wife.  Stated his Lt hip and lower back bother him while standing of walking for long periods of time.    Patient is accompained by:  Family member    Pertinent History  CVA effecting RT side on 07/13/18, lumbar fracture December 2021    Patient Stated Goals  Be able to walk    Currently in Pain?  Yes    Pain Score  7     Pain Location  Leg   LBP and Lt hip   Pain Orientation  Left    Pain Descriptors / Indicators  Aching    Pain Type  Chronic pain    Pain Onset  More than a month ago    Pain Frequency  Intermittent    Aggravating Factors    movement    Pain Relieving Factors  rest    Effect of Pain on Daily Activities  mod effect                        [facility] Adult PT Treatment/Exercise - 08/29/19 0001      Ambulation/Gait   Ambulation/Gait  Yes    Ambulation/Gait Assistance  4: Min guard    Ambulation Distance (Feet)  226 Feet    Assistive device  Rolling walker    Gait Pattern  Step-to pattern;Decreased step length - right;Decreased step length - left;Decreased stance time - right;Decreased stride length;Decreased dorsiflexion - right;Ataxic;Trunk flexed    Ambulation Surface  Level;Indoor    Gait Comments  4 seated rest breaks due to LBP and fatigue      Knee/Hip Exercises: Standing   Forward Step Up  Both;3 sets;Hand Hold: 2;Step Height: 4"    Forward Step Up Limitations  limited by fatigue following 3 reps    Gait Training  226 ft wtih RW and WC behind    Other Standing Knee Exercises  retro gait 2RT in // bars    Other Standing Knee Exercises  sidestepping at // bars 2RT       Knee/Hip Exercises: Seated   Sit to Sand  2 sets;5 reps;with UE support               PT Short Term Goals - 08/15/19 1051      PT SHORT TERM GOAL #1   Title  Patient will be independent with initial HEP to improve functional outcomes    Baseline  Reports compliance    Time  3    Period  Weeks    Status  Achieved    Target Date  08/02/19      PT SHORT TERM GOAL #2   Title  Patient will be able to perform stand x 5 in < 40 seconds to demonstrate improvement in functional mobility and reduced risk for falls.    Baseline  current 55 sec    Time  3    Period  Weeks    Status  On-going    Target Date  08/02/19        PT Long Term Goals - 08/15/19 1051      PT LONG TERM GOAL #1   Title  Patient will be able to perform stand x 5 in < 25  seconds to demonstrate improvement in functional mobility and reduced risk for falls.    Baseline  current 55 sec    Time  6    Period  Weeks    Status  On-going       PT LONG TERM GOAL #2   Title  Patient will be able to perform TUG in <15 seconds, using least restrictive assistive device, to demonstrate improvement in functional mobility and reduced risk for falls.    Baseline  current 65 sec with RW    Time  6    Period  Weeks    Status  On-going      PT LONG TERM GOAL #3   Title  Patient will have equal to or > 4/5 MMT throughout BLE to improve ability to perform functional mobility, stair ambulation and ADLs.    Baseline  See MMT    Time  6    Period  Weeks    Status  Achieved            Plan - 08/29/19 1621    Clinical Impression Statement  Pt continues to fatigue quickly.  Pt making improvements with gait mechanics with Rt LE though does require verbal cueing to press into Rt great toe to reduce ankle inversion, was able to ambulate at faster cadence this session as well.  Resumed step up training, limited by LE weakness wiht fast fatigue.  Pt required increased rest breaks during gait training due partially to Lt side LBP and fatigue.    Personal Factors and Comorbidities  Age;Comorbidity 2    Comorbidities  HX ov CVA, lumbar fracture    Examination-Activity Limitations  Bathing;Bed Mobility;Bend;Squat;Stairs;Carry;Stand;Toileting;Dressing;Transfers;Hygiene/Grooming;Lift;Locomotion Level    Examination-Participation Restrictions  Community Activity;Other;Cleaning    Stability/Clinical Decision Making  Stable/Uncomplicated    Clinical Decision Making  Low    Rehab Potential  Fair    PT Frequency  2x / week    PT Duration  6 weeks    PT Treatment/Interventions  ADLs/Self Care Home Management;Aquatic Therapy;Biofeedback;Cryotherapy;Electrical Stimulation;Iontophoresis 4mg /ml Dexamethasone;Moist Heat;Traction;Balance training;Manual techniques;Therapeutic exercise;Vasopneumatic Device;Wheelchair mobility training;Therapeutic activities;Splinting;Taping;Functional mobility training;Stair training;Orthotic Fit/Training;Gait training;DME  Instruction;Patient/family education;Passive range of motion;Dry needling;Energy conservation;Other (comment);Joint Manipulations;Spinal Manipulations;Compression bandaging;Neuromuscular re-education;Cognitive remediation;Ultrasound;Parrafin;Fluidtherapy;Contrast Bath    PT Next Visit Plan  continue with static balance and gait progression as tolerated. Continue step ups next visit    PT Home Exercise Plan  07/10/19: seated march, LAQs, seated heel/toe raise; 07/15/19: unsupported sitting 1-2 min rounds; 08/15/19: static standing at sink or counter       Patient will benefit from skilled therapeutic intervention in order to improve the following deficits and impairments:  Abnormal gait, Decreased endurance, Hypomobility, Impaired sensation, Decreased activity tolerance, Decreased strength, Impaired UE functional use, Decreased balance, Decreased mobility, Difficulty walking, Decreased coordination, Decreased safety awareness, Postural dysfunction  Visit Diagnosis: Other abnormalities of gait and mobility  Muscle weakness (generalized)     Problem List Patient Active Problem List   Diagnosis Date Noted  . Sundowning 07/04/2019  . Bilateral hearing loss 04/16/2019  . Urinary frequency 03/21/2019  . Chronic right shoulder pain 12/19/2018  . Weakness generalized 09/28/2018  . Palliative care encounter 09/28/2018  . Pressure injury of skin 08/27/2018  . COPD (chronic obstructive pulmonary disease) ([place]) 08/26/2018  . AMS (altered mental status) 08/22/2018  . Altered mental status 08/21/2018  . UTI (urinary tract infection) 08/21/2018  . Abnormality of gait 08/20/2018  . Prolonged Q-T interval on ECG   . Dysphagia, post-stroke   . Supplemental  oxygen dependent   . Anxiety state   . Fatigue   . SOB (shortness of breath)   . Hyperglycemia   . History of lung cancer   . Depression   . CKD (chronic kidney disease), stage III   . Acute blood loss anemia   . Fall 07/16/2018  . Adenopathy R  paratracheal 07/16/2018  . 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 Acute spontaneous 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 ([place]) L thalamic 07/14/2018  . Cellulitis of arm, right 06/01/2018  . Rash and nonspecific skin eruption 06/01/2018  . Chest pain 02/02/2018  . Thrombocytopenia ([place]) 02/02/2018  . Hypokalemia 02/02/2018  . Pulmonary embolism ([place]) 02/02/2018  . Elevated troponin 02/02/2018  . Pulmonary embolus ([place]) 02/02/2018  . Non-ST elevation MI (NSTEMI) ([place]) 06/27/2017  . Diastolic CHF ([place]) 75/91/6384  . Chest pain due to CAD ([place]) 06/26/2017  . Dyspnea 06/21/2017  . Acute bronchitis 05/15/2017  . Oral candidiasis 05/15/2017  . Acute pericarditis   . Chest pain at rest 12/20/2016  . Angina pectoris ([place]) 12/20/2016  . Radiation pneumonitis ([place]) 11/09/2016  . Pleurisy 10/11/2016  . Chest wall pain 10/11/2016  . Atrial fibrillation ([place]) 05/21/2016  . Adenocarcinoma of right lung ([place]) 01/06/2016  . GAD (generalized anxiety disorder) 11/20/2015  . Essential hypertension   . Cellulitis 09/04/2015  . Cellulitis of left axilla   . Esophageal ulcer without bleeding   . Bilateral lower extremity edema 04/28/2014  . BPH (benign prostatic hyperplasia) 10/08/2012  . Anxiety   . EKG abnormalities 09/05/2012  . Tremor 09/05/2012  . Chronic fatigue 09/05/2012  . Arthritis   . Asthma, chronic   . Reflux esophagitis   . S/P CABG (coronary artery bypass graft)   . GERD (gastroesophageal reflux disease)   . Hyperlipidemia   . Hypertension   . UNSPECIFIED PERIPHERAL VASCULAR DISEASE 06/16/2009   Ihor Austin, LPTA/CLT; CBIS [phone]  Aldona Lento 08/29/2019, 4:25 PM  [place], [place], [address] Phone: [phone]   Fax:  [phone]  Name: JORGEN WOLFINGER MRN: [id number] Date of Birth: 22-Feb-1928

## 2019-08-30 ENCOUNTER — Encounter (HOSPITAL_COMMUNITY): Payer: Self-pay | Admitting: Occupational Therapy

## 2019-08-30 ENCOUNTER — Other Ambulatory Visit: Payer: Self-pay

## 2019-08-30 ENCOUNTER — Ambulatory Visit (HOSPITAL_COMMUNITY): Payer: Medicare HMO | Admitting: Occupational Therapy

## 2019-08-30 DIAGNOSIS — R29898 Other symptoms and signs involving the musculoskeletal system: Secondary | ICD-10-CM | POA: Diagnosis not present

## 2019-08-30 DIAGNOSIS — R278 Other lack of coordination: Secondary | ICD-10-CM | POA: Diagnosis not present

## 2019-08-30 DIAGNOSIS — M6281 Muscle weakness (generalized): Secondary | ICD-10-CM | POA: Diagnosis not present

## 2019-08-30 DIAGNOSIS — R2689 Other abnormalities of gait and mobility: Secondary | ICD-10-CM | POA: Diagnosis not present

## 2019-08-30 NOTE — Therapy (Signed)
[place], [place], [address] Phone: [phone]   Fax:  [phone]  Occupational Therapy Treatment  Patient Details  Name: Evan [name] MRN: [id number] Date of Birth: 28-Mar-1928 Referring Provider (OT): [name]   Encounter Date: 08/30/2019  OT End of Session - 08/30/19 1219    Visit Number  8    Number of Visits  12    Date for OT Re-Evaluation  09/10/19    Authorization Type  [employer] HMO    Authorization Time Period  12 visits approved (07/30/19-09/10/19) $40 copay    Authorization - Visit Number  9    Authorization - Number of Visits  12    Progress Note Due on Visit  10    OT Start Time  [phone]    OT Stop Time  1030    OT Time Calculation (min)  38 min    Activity Tolerance  Patient tolerated treatment well    Behavior During Therapy  [facility] for tasks assessed/performed       Past Medical History:  Diagnosis Date  . Adenocarcinoma of right lung ([place]) 01/06/2016  . Anginal chest pain at rest [facility])    Chronic non, controlled on Lorazepam  . Anxiety   . Arthritis    "all over"   . BPH (benign prostatic hyperplasia)   . CAD (coronary artery disease)   . Chronic lower back pain   . Complication of anesthesia    "problems making water afterwards"  . Depression   . DJD (degenerative joint disease)   . Esophageal ulcer without bleeding    bid ppi indefinitely  . Family history of adverse reaction to anesthesia    "children w/PONV"  . GERD (gastroesophageal reflux disease)   . Headache    "couple/week maybe" (09/04/2015)  . Hemochromatosis    Possible, elevated iron stores, hook-like osteophytes on hand films, normal LFTs  . Hepatitis    "yellow jaundice as a baby"  . History of ASCVD    MULTIVESSEL  . Hyperlipidemia   . Hypertension   . Osteoarthritis   . Pneumonia    "several times; got a little now" (09/04/2015)  . Rheumatoid arthritis ([place])   . Subdural hematoma ([facility])    small  after fall 09/2016-plavix held, neurosurgery consulted.  Asymptomatic.    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 ARM SKIN LESION BIOPSY / EXCISION Left 09/02/2015  . CATARACT EXTRACTION W/ INTRAOCULAR LENS  IMPLANT, BILATERAL Bilateral   . CHOLECYSTECTOMY OPEN    . CORNEAL TRANSPLANT Bilateral    "one at [facility]; one at [facility]"  . CORONARY ANGIOPLASTY WITH STENT PLACEMENT    . CORONARY ARTERY BYPASS GRAFT  1999  . DESCENDING AORTIC ANEURYSM REPAIR W/ STENT     DE stent ostium into the right radial free graft at OM-, 02-2007  . ESOPHAGOGASTRODUODENOSCOPY N/A 11/09/2014   Procedure: ESOPHAGOGASTRODUODENOSCOPY (EGD);  Surgeon: [name], MD;  Location: [facility] ENDOSCOPY;  Service: Endoscopy;  Laterality: N/A;  . INGUINAL HERNIA REPAIR    . JOINT REPLACEMENT    . LEFT HEART CATH AND CORS/GRAFTS ANGIOGRAPHY N/A 06/27/2017   Procedure: LEFT HEART CATH AND CORS/GRAFTS ANGIOGRAPHY;  Surgeon: [name], MD;  Location: [place] CV LAB;  Service: Cardiovascular;  Laterality: N/A;  . NASAL SINUS SURGERY    . SHOULDER OPEN ROTATOR CUFF REPAIR  Right   . TOTAL KNEE ARTHROPLASTY Bilateral     There were no vitals filed for this visit.  Subjective Assessment - 08/30/19 1206    Subjective   S: This weekend I turn 89.    Patient is accompanied by:  Family member   Daughter   Pertinent History  Patient is a36 y/o male S/P left CVA sustained 07/13/18. He received home health therapy services (OT, PT,SLP) and would like to continue with outpatient services due to his right hand weakness/uncoordination. [name] has referred patient to occupational therapy for evaluation and treatment.    Currently in Pain?  Yes    Pain Score  5     Pain Location  Flank    Pain Orientation  Right    Pain Descriptors / Indicators  Sore                   OT Treatments/Exercises (OP) - 08/30/19 1207       Neurological Re-education Exercises   Other Exercises 1  At beginning and end of session, pt participating in metronome task. Tapping on table along to metroname with RUE; requiring Mod-Max hand over hand to tap in time with beep. Metronome set to 40 BPM. Pt able to sustain ~20 seconds before require a rest break. x5 reps. Also having LUE tap when resting RUE.     Other Grasp and Release Exercises   ball peg task. Focused on breaking reach pattern down into three steps of open hand, reach, and squeeze ball. Pt requiring Mod-Max hand over hand for targets reach and then to maintain grasp on ball pegs. 5 times twice    Other Weight-Bearing Exercises 1  Focus on weight bearing through RUE at table. Having pt stand at table and place RUE on therputty to then flatten putty. Performing two reps in standing and then pt requiring seated rest break. 4 sets.                OT Short Term Goals - 08/01/19 1310      OT SHORT TERM GOAL #1   Title  Patient and family will be educated and verbalize independence with HEP in order to increase functional use of his right hand while using for 25% or more of daily tasks.    Time  6    Period  Weeks    Status  On-going    Target Date  09/10/19      OT SHORT TERM GOAL #2   Title  Patient will increase scapular and proximal shoulder strengthening while demonstrating increased shoulder endurance during a sustained functional reaching task for more than 1 minute.    Time  6    Period  Weeks    Status  On-going      OT SHORT TERM GOAL #3   Title  Patient will increase gross motor coordination by moving 10 or more blocks during the blocks and box test allow him to complete self feeding tasks with less difficulty.    Time  6    Period  Weeks    Status  On-going      OT SHORT TERM GOAL #4   Title  Patient will increase his right hand grip strength by 6# and his pinch strength by 3# in order to hold his utensil during meals without dropping or fatigue.     Time  6    Period  Weeks    Status  On-going  OT SHORT TERM GOAL #5   Title  Patient will be educated on adaptive eqipment/strategies that may help improve overall motor control when completing basic self care tasks.    Time  6    Period  Weeks    Status  On-going               Plan - 08/30/19 1220    Clinical Impression Statement  A: Started and ended session on metronome tapping to challenges gross motor coorindation and motor planning. Pt performing weight bearing to stand and flatten yellow theraputty with RUE. Pt performing functional reach to grasp and move ball pegs; requiring support at elbow and Mod-Max A for targets reach and maintaining grasp. Providing cues thorughout.    Body Structure / Function / Physical Skills  ADL;UE functional use;FMC;GMC;Coordination;IADL;Dexterity;Strength    Plan  P: continue metronome challenging motor planning and gross motor coorindation. Attempt UB dressing as pt has been attempting to try and dress himself at home. Continue shoulder atbility, FM coorindation, GM coorindation, and reaching.    Consulted and Agree with Plan of Care  Patient;Family member/caregiver    Family Member Consulted  Daughter; [name]       Patient will benefit from skilled therapeutic intervention in order to improve the following deficits and impairments:   Body Structure / Function / Physical Skills: ADL, UE functional use, FMC, GMC, Coordination, IADL, Dexterity, Strength       Visit Diagnosis: Other lack of coordination  Other symptoms and signs involving the musculoskeletal system    Problem List Patient Active Problem List   Diagnosis Date Noted  . Sundowning 07/04/2019  . Bilateral hearing loss 04/16/2019  . Urinary frequency 03/21/2019  . Chronic right shoulder pain 12/19/2018  . Weakness generalized 09/28/2018  . Palliative care encounter 09/28/2018  . Pressure injury of skin 08/27/2018  . COPD (chronic obstructive pulmonary disease)  ([place]) 08/26/2018  . AMS (altered mental status) 08/22/2018  . Altered mental status 08/21/2018  . UTI (urinary tract infection) 08/21/2018  . Abnormality of gait 08/20/2018  . Prolonged Q-T interval on ECG   . Dysphagia, post-stroke   . Supplemental oxygen dependent   . Anxiety state   . Fatigue   . SOB (shortness of breath)   . Hyperglycemia   . History of lung cancer   . Depression   . CKD (chronic kidney disease), stage III   . Acute blood loss anemia   . Fall 07/16/2018  . 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 Acute spontaneous 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 ([place]) L thalamic 07/14/2018  . Cellulitis of arm, right 06/01/2018  . Rash and nonspecific skin eruption 06/01/2018  . Chest pain 02/02/2018  . Thrombocytopenia ([place]) 02/02/2018  . Hypokalemia 02/02/2018  . Pulmonary embolism ([place]) 02/02/2018  . Elevated troponin 02/02/2018  . Pulmonary embolus ([place]) 02/02/2018  . Non-ST elevation MI (NSTEMI) ([place]) 06/27/2017  . Diastolic CHF ([place]) 09/47/0962  . Chest pain due to CAD ([place]) 06/26/2017  . Dyspnea 06/21/2017  . Acute bronchitis 05/15/2017  . Oral candidiasis 05/15/2017  . Acute pericarditis   . Chest pain at rest 12/20/2016  . Angina pectoris ([place]) 12/20/2016  . Radiation pneumonitis ([place]) 11/09/2016  . Pleurisy 10/11/2016  . Chest wall pain 10/11/2016  . Atrial fibrillation ([place]) 05/21/2016  . Adenocarcinoma of right lung ([place]) 01/06/2016  . GAD (generalized anxiety disorder) 11/20/2015  . Essential hypertension   . Cellulitis 09/04/2015  . Cellulitis  of left axilla   . Esophageal ulcer without bleeding   . Bilateral lower extremity edema 04/28/2014  . BPH (benign prostatic hyperplasia) 10/08/2012  . Anxiety   . EKG abnormalities 09/05/2012  . Tremor 09/05/2012  . Chronic fatigue 09/05/2012  . Arthritis   . Asthma, chronic   . Reflux esophagitis   . S/P CABG (coronary artery  bypass graft)   . GERD (gastroesophageal reflux disease)   . Hyperlipidemia   . Hypertension   . UNSPECIFIED PERIPHERAL VASCULAR DISEASE 06/16/2009    Neal Dy, MSOT, OTR/L 08/30/2019, 12:33 PM  [facility] [address] [place], [place], [address] Phone: [phone]   Fax:  [phone]  Name: Evan [name] MRN: [id number] Date of Birth: December 30, 1927

## 2019-09-03 ENCOUNTER — Ambulatory Visit (HOSPITAL_COMMUNITY): Payer: Medicare HMO | Attending: Family Medicine | Admitting: Occupational Therapy

## 2019-09-03 ENCOUNTER — Encounter (HOSPITAL_COMMUNITY): Payer: Self-pay | Admitting: Occupational Therapy

## 2019-09-03 ENCOUNTER — Other Ambulatory Visit: Payer: Self-pay

## 2019-09-03 DIAGNOSIS — R29898 Other symptoms and signs involving the musculoskeletal system: Secondary | ICD-10-CM

## 2019-09-03 DIAGNOSIS — R278 Other lack of coordination: Secondary | ICD-10-CM | POA: Diagnosis not present

## 2019-09-03 DIAGNOSIS — M6281 Muscle weakness (generalized): Secondary | ICD-10-CM | POA: Diagnosis not present

## 2019-09-03 DIAGNOSIS — R2689 Other abnormalities of gait and mobility: Secondary | ICD-10-CM | POA: Diagnosis not present

## 2019-09-03 NOTE — Therapy (Signed)
[place], [place], [address] Phone: [phone]   Fax:  [phone]  Occupational Therapy Treatment  Patient Details  Name: Evan [name] MRN: [id number] Date of Birth: Jul 17, 1927 Referring Provider (OT): [name]   Encounter Date: 09/03/2019  OT End of Session - 09/03/19 1340    Visit Number  9    Number of Visits  12    Date for OT Re-Evaluation  09/10/19    Authorization Type  [employer] HMO    Authorization Time Period  12 visits approved (07/30/19-09/10/19) $40 copay    Authorization - Visit Number  10    Authorization - Number of Visits  12    Progress Note Due on Visit  10    OT Start Time  0900    OT Stop Time  0944    OT Time Calculation (min)  44 min    Activity Tolerance  Patient tolerated treatment well    Behavior During Therapy  [facility] for tasks assessed/performed       Past Medical History:  Diagnosis Date  . Adenocarcinoma of right lung ([place]) 01/06/2016  . Anginal chest pain at rest [facility])    Chronic non, controlled on Lorazepam  . Anxiety   . Arthritis    "all over"   . BPH (benign prostatic hyperplasia)   . CAD (coronary artery disease)   . Chronic lower back pain   . Complication of anesthesia    "problems making water afterwards"  . Depression   . DJD (degenerative joint disease)   . Esophageal ulcer without bleeding    bid ppi indefinitely  . Family history of adverse reaction to anesthesia    "children w/PONV"  . GERD (gastroesophageal reflux disease)   . Headache    "couple/week maybe" (09/04/2015)  . Hemochromatosis    Possible, elevated iron stores, hook-like osteophytes on hand films, normal LFTs  . Hepatitis    "yellow jaundice as a baby"  . History of ASCVD    MULTIVESSEL  . Hyperlipidemia   . Hypertension   . Osteoarthritis   . Pneumonia    "several times; got a little now" (09/04/2015)  . Rheumatoid arthritis ([place])   . Subdural hematoma ([facility])    small  after fall 09/2016-plavix held, neurosurgery consulted.  Asymptomatic.    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 ARM SKIN LESION BIOPSY / EXCISION Left 09/02/2015  . CATARACT EXTRACTION W/ INTRAOCULAR LENS  IMPLANT, BILATERAL Bilateral   . CHOLECYSTECTOMY OPEN    . CORNEAL TRANSPLANT Bilateral    "one at [facility]; one at [facility]"  . CORONARY ANGIOPLASTY WITH STENT PLACEMENT    . CORONARY ARTERY BYPASS GRAFT  1999  . DESCENDING AORTIC ANEURYSM REPAIR W/ STENT     DE stent ostium into the right radial free graft at OM-, 02-2007  . ESOPHAGOGASTRODUODENOSCOPY N/A 11/09/2014   Procedure: ESOPHAGOGASTRODUODENOSCOPY (EGD);  Surgeon: [name], MD;  Location: [facility] ENDOSCOPY;  Service: Endoscopy;  Laterality: N/A;  . INGUINAL HERNIA REPAIR    . JOINT REPLACEMENT    . LEFT HEART CATH AND CORS/GRAFTS ANGIOGRAPHY N/A 06/27/2017   Procedure: LEFT HEART CATH AND CORS/GRAFTS ANGIOGRAPHY;  Surgeon: [name], MD;  Location: [place] CV LAB;  Service: Cardiovascular;  Laterality: N/A;  . NASAL SINUS SURGERY    . SHOULDER OPEN ROTATOR CUFF REPAIR  Right   . TOTAL KNEE ARTHROPLASTY Bilateral     There were no vitals filed for this visit.  Subjective Assessment - 09/03/19 1331    Subjective   S: "I can't control this hand."    Patient is accompanied by:  Family member    Pertinent History  Patient is a16 y/o male S/P left CVA sustained 07/13/18. He received home health therapy services (OT, PT,SLP) and would like to continue with outpatient services due to his right hand weakness/uncoordination. [name] has referred patient to occupational therapy for evaluation and treatment.    Currently in Pain?  Yes    Pain Score  6     Pain Location  Arm    Pain Orientation  Right    Pain Descriptors / Indicators  Sore                   OT Treatments/Exercises (OP) - 09/03/19 1333      ADLs   UB  Dressing  Donning/doffing XL button shirt with Min cues. CUing for compensaotry techniques as well as incorporation of RUE into task. Pt donning RUE and then LUE; reverse for doffing.       Neurological Re-education Exercises   Other Exercises 1  At beginning and end of session, pt participating in metronome task. 40 BPM. Alternating Rue and then LUE tapping on table. Min-Mod A for managing RUE. Rest breaks between. 3 reps at beginnign and 3 reps at end of session.    Other Weight-Bearing Exercises 1  Focus on weight bearing through RUE at table. Having pt stand at table and place RUE on therputty to then flatten putty. Performing three reps and three sets. Seated rest breaks between each set.               OT Short Term Goals - 08/01/19 1310      OT SHORT TERM GOAL #1   Title  Patient and family will be educated and verbalize independence with HEP in order to increase functional use of his right hand while using for 25% or more of daily tasks.    Time  6    Period  Weeks    Status  On-going    Target Date  09/10/19      OT SHORT TERM GOAL #2   Title  Patient will increase scapular and proximal shoulder strengthening while demonstrating increased shoulder endurance during a sustained functional reaching task for more than 1 minute.    Time  6    Period  Weeks    Status  On-going      OT SHORT TERM GOAL #3   Title  Patient will increase gross motor coordination by moving 10 or more blocks during the blocks and box test allow him to complete self feeding tasks with less difficulty.    Time  6    Period  Weeks    Status  On-going      OT SHORT TERM GOAL #4   Title  Patient will increase his right hand grip strength by 6# and his pinch strength by 3# in order to hold his utensil during meals without dropping or fatigue.    Time  6    Period  Weeks    Status  On-going      OT SHORT TERM GOAL #5   Title  Patient will be educated on adaptive eqipment/strategies that may help  improve overall motor control when completing basic self care tasks.  Time  6    Period  Weeks    Status  On-going               Plan - 09/03/19 1341    Clinical Impression Statement  A: Started and ended session on metronome tapping to challenges gross motor coorindation and motor planning. Pt performing weight bearing to stand and flatten yellow theraputty with RUE. UB dressingf tasks; three reps.Providing cues thorughout.    Body Structure / Function / Physical Skills  ADL;UE functional use;FMC;GMC;Coordination;IADL;Dexterity;Strength    Plan  P: Continue metronome task at beginning and end of session. Continue to focus on motor planning for targeted reach and ADLs.    Consulted and Agree with Plan of Care  Patient;Family member/caregiver    Family Member Consulted  Daughter; [name]       Patient will benefit from skilled therapeutic intervention in order to improve the following deficits and impairments:   Body Structure / Function / Physical Skills: ADL, UE functional use, FMC, GMC, Coordination, IADL, Dexterity, Strength       Visit Diagnosis: Other lack of coordination  Other symptoms and signs involving the musculoskeletal system    Problem List Patient Active Problem List   Diagnosis Date Noted  . Sundowning 07/04/2019  . Bilateral hearing loss 04/16/2019  . Urinary frequency 03/21/2019  . Chronic right shoulder pain 12/19/2018  . Weakness generalized 09/28/2018  . Palliative care encounter 09/28/2018  . Pressure injury of skin 08/27/2018  . COPD (chronic obstructive pulmonary disease) ([place]) 08/26/2018  . AMS (altered mental status) 08/22/2018  . Altered mental status 08/21/2018  . UTI (urinary tract infection) 08/21/2018  . Abnormality of gait 08/20/2018  . Prolonged Q-T interval on ECG   . Dysphagia, post-stroke   . Supplemental oxygen dependent   . Anxiety state   . Fatigue   . SOB (shortness of breath)   . Hyperglycemia   . History of lung  cancer   . Depression   . CKD (chronic kidney disease), stage III   . Acute blood loss anemia   . Fall 07/16/2018  . 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 Acute spontaneous 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 ([place]) L thalamic 07/14/2018  . Cellulitis of arm, right 06/01/2018  . Rash and nonspecific skin eruption 06/01/2018  . Chest pain 02/02/2018  . Thrombocytopenia ([place]) 02/02/2018  . Hypokalemia 02/02/2018  . Pulmonary embolism ([place]) 02/02/2018  . Elevated troponin 02/02/2018  . Pulmonary embolus ([place]) 02/02/2018  . Non-ST elevation MI (NSTEMI) ([place]) 06/27/2017  . Diastolic CHF ([place]) 21/30/8657  . Chest pain due to CAD ([place]) 06/26/2017  . Dyspnea 06/21/2017  . Acute bronchitis 05/15/2017  . Oral candidiasis 05/15/2017  . Acute pericarditis   . Chest pain at rest 12/20/2016  . Angina pectoris ([place]) 12/20/2016  . Radiation pneumonitis ([place]) 11/09/2016  . Pleurisy 10/11/2016  . Chest wall pain 10/11/2016  . Atrial fibrillation ([place]) 05/21/2016  . Adenocarcinoma of right lung ([place]) 01/06/2016  . GAD (generalized anxiety disorder) 11/20/2015  . Essential hypertension   . Cellulitis 09/04/2015  . Cellulitis of left axilla   . Esophageal ulcer without bleeding   . Bilateral lower extremity edema 04/28/2014  . BPH (benign prostatic hyperplasia) 10/08/2012  . Anxiety   . EKG abnormalities 09/05/2012  . Tremor 09/05/2012  . Chronic fatigue 09/05/2012  . Arthritis   . Asthma, chronic   . Reflux esophagitis   . S/P CABG (coronary artery bypass  graft)   . GERD (gastroesophageal reflux disease)   . Hyperlipidemia   . Hypertension   . UNSPECIFIED PERIPHERAL VASCULAR DISEASE 06/16/2009    Neal Dy, MSOT, OTR/L 09/03/2019, 4:22 PM  [facility] [address] [place], [place], [address] Phone: [phone]   Fax:  [phone]  Name: REZNOR FERRANDO MRN:  [id number] Date of Birth: 11-22-1927

## 2019-09-04 ENCOUNTER — Encounter (HOSPITAL_COMMUNITY): Payer: Self-pay | Admitting: Physical Therapy

## 2019-09-04 ENCOUNTER — Ambulatory Visit (HOSPITAL_COMMUNITY): Payer: Medicare HMO | Admitting: Physical Therapy

## 2019-09-04 DIAGNOSIS — R278 Other lack of coordination: Secondary | ICD-10-CM | POA: Diagnosis not present

## 2019-09-04 DIAGNOSIS — R29898 Other symptoms and signs involving the musculoskeletal system: Secondary | ICD-10-CM | POA: Diagnosis not present

## 2019-09-04 DIAGNOSIS — R2689 Other abnormalities of gait and mobility: Secondary | ICD-10-CM

## 2019-09-04 DIAGNOSIS — M6281 Muscle weakness (generalized): Secondary | ICD-10-CM | POA: Diagnosis not present

## 2019-09-04 NOTE — Patient Instructions (Signed)
Access Code: [email or web address] URL: [email or web address] Date: 09/04/2019 Prepared by: Josue Hector  Exercises Ankle Inversion Eversion Towel Slide - 3 x daily - 7 x weekly - 2 sets - 10 reps Seated Ankle Eversion with Resistance - 3 x daily - 7 x weekly - 2 sets - 10 reps

## 2019-09-04 NOTE — Therapy (Signed)
[place], [place], [address] Phone: [phone]   Fax:  [phone]  Physical Therapy Treatment  Patient Details  Name: Evan [name] MRN: [id number] Date of Birth: 07/20/1927 Referring Provider (PT): [name] MD    Encounter Date: 09/04/2019  PT End of Session - 09/04/19 0907    Visit Number  15    Number of Visits  20    Date for PT Re-Evaluation  09/20/19    Authorization Type  [employer] Medicare (submitted for 8 visit extension on 08/15/19)    Authorization Time Period  08/23/19-09/20/19    Authorization - Visit Number  3    Authorization - Number of Visits  8    Progress Note Due on Visit  20    PT Start Time  0902    PT Stop Time  0942    PT Time Calculation (min)  40 min    Equipment Utilized During Treatment  Gait belt    Activity Tolerance  Patient limited by fatigue    Behavior During Therapy  [facility] for tasks assessed/performed       Past Medical History:  Diagnosis Date  . Adenocarcinoma of right lung ([place]) 01/06/2016  . Anginal chest pain at rest [facility])    Chronic non, controlled on Lorazepam  . Anxiety   . Arthritis    "all over"   . BPH (benign prostatic hyperplasia)   . CAD (coronary artery disease)   . Chronic lower back pain   . Complication of anesthesia    "problems making water afterwards"  . Depression   . DJD (degenerative joint disease)   . Esophageal ulcer without bleeding    bid ppi indefinitely  . Family history of adverse reaction to anesthesia    "children w/PONV"  . GERD (gastroesophageal reflux disease)   . Headache    "couple/week maybe" (09/04/2015)  . Hemochromatosis    Possible, elevated iron stores, hook-like osteophytes on hand films, normal LFTs  . Hepatitis    "yellow jaundice as a baby"  . History of ASCVD    MULTIVESSEL  . Hyperlipidemia   . Hypertension   . Osteoarthritis   . Pneumonia    "several times; got a little now" (09/04/2015)  . Rheumatoid  arthritis ([place])   . Subdural hematoma ([facility])    small after fall 09/2016-plavix held, neurosurgery consulted.  Asymptomatic.    Marland Kitchen 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[place]) 02/2018   on Lovenox lifelong since he failed PO Eliquis    Past Surgical History:  Procedure Laterality Date  . ARM SKIN LESION BIOPSY / EXCISION Left 09/02/2015  . CATARACT EXTRACTION W/ INTRAOCULAR LENS  IMPLANT, BILATERAL Bilateral   . CHOLECYSTECTOMY OPEN    . CORNEAL TRANSPLANT Bilateral    "one at [facility]; one at [facility]"  . CORONARY ANGIOPLASTY WITH STENT PLACEMENT    . CORONARY ARTERY BYPASS GRAFT  1999  . DESCENDING AORTIC ANEURYSM REPAIR W/ STENT     DE stent ostium into the right radial free graft at OM-, 02-2007  . ESOPHAGOGASTRODUODENOSCOPY N/A 11/09/2014   Procedure: ESOPHAGOGASTRODUODENOSCOPY (EGD);  Surgeon: [name], MD;  Location: [facility] ENDOSCOPY;  Service: Endoscopy;  Laterality: N/A;  . INGUINAL HERNIA REPAIR    . JOINT REPLACEMENT    . LEFT HEART CATH AND CORS/GRAFTS ANGIOGRAPHY N/A 06/27/2017   Procedure: LEFT HEART CATH AND CORS/GRAFTS ANGIOGRAPHY;  Surgeon: [name], MD;  Location: [place] CV LAB;  Service: Cardiovascular;  Laterality: N/A;  .  NASAL SINUS SURGERY    . SHOULDER OPEN ROTATOR CUFF REPAIR Right   . TOTAL KNEE ARTHROPLASTY Bilateral     There were no vitals filed for this visit.  Subjective Assessment - 09/04/19 0905    Subjective  Patient reports no new complaints. Says his legs are tired.    Patient is accompained by:  Family member   Daughter   Pertinent History  CVA effecting RT side on 07/13/18, lumbar fracture December 2021    Patient Stated Goals  Be able to walk    Currently in Pain?  No/denies    Pain Onset  More than a month ago                        [facility] Adult PT Treatment/Exercise - 09/04/19 0001      Knee/Hip Exercises: Standing   Forward Step Up  Right;5 reps;Hand Hold: 2;Step Height: 4"   cues for avoiding RT ankle inversion    Gait Training  50 feet with  RW and WC follow, rest break x 1    Other Standing Knee Exercises  4 inch box taps, HHA x 2, x15 reps       Knee/Hip Exercises: Seated   Other Seated Knee/Hip Exercises  RTB ankle eversion RT x15     Other Seated Knee/Hip Exercises  towel windshield wipers 2 x15, cues for RT foot flat on floor     Sit to Sand  2 sets;5 reps;with UE support             PT Education - 09/04/19 0946    Education Details  on HEP    Person(s) Educated  Patient;Child(ren)    Methods  Explanation    Comprehension  Verbalized understanding       PT Short Term Goals - 08/15/19 1051      PT SHORT TERM GOAL #1   Title  Patient will be independent with initial HEP to improve functional outcomes    Baseline  Reports compliance    Time  3    Period  Weeks    Status  Achieved    Target Date  08/02/19      PT SHORT TERM GOAL #2   Title  Patient will be able to perform stand x 5 in < 40 seconds to demonstrate improvement in functional mobility and reduced risk for falls.    Baseline  current 55 sec    Time  3    Period  Weeks    Status  On-going    Target Date  08/02/19        PT Long Term Goals - 08/15/19 1051      PT LONG TERM GOAL #1   Title  Patient will be able to perform stand x 5 in < 25 seconds to demonstrate improvement in functional mobility and reduced risk for falls.    Baseline  current 55 sec    Time  6    Period  Weeks    Status  On-going      PT LONG TERM GOAL #2   Title  Patient will be able to perform TUG in <15 seconds, using least restrictive assistive device, to demonstrate improvement in functional mobility and reduced risk for falls.    Baseline  current 65 sec with RW    Time  6    Period  Weeks    Status  On-going      PT LONG TERM GOAL #  3   Title  Patient will have equal to or > 4/5 MMT throughout BLE to improve ability to perform functional mobility, stair ambulation and ADLs.    Baseline  See MMT    Time  6    Period  Weeks    Status  Achieved             Plan - 09/04/19 0943    Clinical Impression Statement  Patient limited by increased fatigue today. Patient with increased difficulty coordinating RLE. Patient unable to ambulate as far as previous visit, and unable to tolerate standing activity as well due to this. Modified ther ex to present deficits. Focused more on RLE coordination and RT ankle eversion strengthening to avoid inverting RT foot when standing, walking. Patient and daughter educated on purpose and function of added ther ex, and issued updated HEP handout. Patient will continue to benefit from skilled therapy services to progress LE strength and coordination to improve functional mobility and reduce risk for future falls.    Personal Factors and Comorbidities  Age;Comorbidity 2    Comorbidities  HX ov CVA, lumbar fracture    Examination-Activity Limitations  Bathing;Bed Mobility;Bend;Squat;Stairs;Carry;Stand;Toileting;Dressing;Transfers;Hygiene/Grooming;Lift;Locomotion Level    Examination-Participation Restrictions  Community Activity;Other;Cleaning    Stability/Clinical Decision Making  Stable/Uncomplicated    Rehab Potential  Fair    PT Frequency  2x / week    PT Duration  6 weeks    PT Treatment/Interventions  ADLs/Self Care Home Management;Aquatic Therapy;Biofeedback;Cryotherapy;Electrical Stimulation;Iontophoresis 4mg /ml Dexamethasone;Moist Heat;Traction;Balance training;Manual techniques;Therapeutic exercise;Vasopneumatic Device;Wheelchair mobility training;Therapeutic activities;Splinting;Taping;Functional mobility training;Stair training;Orthotic Fit/Training;Gait training;DME Instruction;Patient/family education;Passive range of motion;Dry needling;Energy conservation;Other (comment);Joint Manipulations;Spinal Manipulations;Compression bandaging;Neuromuscular re-education;Cognitive remediation;Ultrasound;Parrafin;Fluidtherapy;Contrast Bath    PT Next Visit Plan  continue with static balance and gait progression as  tolerated. Continue step ups next visit if tolerated    PT Home Exercise Plan  07/10/19: seated march, LAQs, seated heel/toe raise; 07/15/19: unsupported sitting 1-2 min rounds; 08/15/19: static standing at sink or counter 09/04/19: seated windsheild wiper, ankle eversion with RTB    Consulted and Agree with Plan of Care  Patient;Family member/caregiver    Family Member Consulted  Daughter       Patient will benefit from skilled therapeutic intervention in order to improve the following deficits and impairments:  Abnormal gait, Decreased endurance, Hypomobility, Impaired sensation, Decreased activity tolerance, Decreased strength, Impaired UE functional use, Decreased balance, Decreased mobility, Difficulty walking, Decreased coordination, Decreased safety awareness, Postural dysfunction  Visit Diagnosis: Other abnormalities of gait and mobility  Muscle weakness (generalized)     Problem List Patient Active Problem List   Diagnosis Date Noted  . Sundowning 07/04/2019  . Bilateral hearing loss 04/16/2019  . Urinary frequency 03/21/2019  . Chronic right shoulder pain 12/19/2018  . Weakness generalized 09/28/2018  . Palliative care encounter 09/28/2018  . Pressure injury of skin 08/27/2018  . COPD (chronic obstructive pulmonary disease) ([place]) 08/26/2018  . AMS (altered mental status) 08/22/2018  . Altered mental status 08/21/2018  . UTI (urinary tract infection) 08/21/2018  . Abnormality of gait 08/20/2018  . Prolonged Q-T interval on ECG   . Dysphagia, post-stroke   . Supplemental oxygen dependent   . Anxiety state   . Fatigue   . SOB (shortness of breath)   . Hyperglycemia   . History of lung cancer   . Depression   . CKD (chronic kidney disease), stage III   . Acute blood loss anemia   . Fall 07/16/2018  . 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 Acute spontaneous 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 ([place])  L thalamic 07/14/2018  . Cellulitis of arm, right 06/01/2018  . Rash and nonspecific skin eruption 06/01/2018  . Chest pain 02/02/2018  . Thrombocytopenia ([place]) 02/02/2018  . Hypokalemia 02/02/2018  . Pulmonary embolism ([place]) 02/02/2018  . Elevated troponin 02/02/2018  . Pulmonary embolus ([place]) 02/02/2018  . Non-ST elevation MI (NSTEMI) ([place]) 06/27/2017  . Diastolic CHF ([place]) 29/52/8413  . Chest pain due to CAD ([place]) 06/26/2017  . Dyspnea 06/21/2017  . Acute bronchitis 05/15/2017  . Oral candidiasis 05/15/2017  . Acute pericarditis   . Chest pain at rest 12/20/2016  . Angina pectoris ([place]) 12/20/2016  . Radiation pneumonitis ([place]) 11/09/2016  . Pleurisy 10/11/2016  . Chest wall pain 10/11/2016  . Atrial fibrillation ([place]) 05/21/2016  . Adenocarcinoma of right lung ([place]) 01/06/2016  . GAD (generalized anxiety disorder) 11/20/2015  . Essential hypertension   . Cellulitis 09/04/2015  . Cellulitis of left axilla   . Esophageal ulcer without bleeding   . Bilateral lower extremity edema 04/28/2014  . BPH (benign prostatic hyperplasia) 10/08/2012  . Anxiety   . EKG abnormalities 09/05/2012  . Tremor 09/05/2012  . Chronic fatigue 09/05/2012  . Arthritis   . Asthma, chronic   . Reflux esophagitis   . S/P CABG (coronary artery bypass graft)   . GERD (gastroesophageal reflux disease)   . Hyperlipidemia   . Hypertension   . UNSPECIFIED PERIPHERAL VASCULAR DISEASE 06/16/2009    9:47 AM, 09/04/19 Josue Hector PT DPT  Physical Therapist with [facility]  [phone] [place] [address] [place], [place], [address] Phone: [phone]   Fax:  [phone]  Name: Evan [name] MRN: [id number] Date of Birth: [birth year]

## 2019-09-05 ENCOUNTER — Ambulatory Visit (HOSPITAL_COMMUNITY): Payer: Medicare HMO

## 2019-09-05 ENCOUNTER — Other Ambulatory Visit: Payer: Self-pay

## 2019-09-05 ENCOUNTER — Encounter (HOSPITAL_COMMUNITY): Payer: Self-pay

## 2019-09-05 DIAGNOSIS — M6281 Muscle weakness (generalized): Secondary | ICD-10-CM | POA: Diagnosis not present

## 2019-09-05 DIAGNOSIS — R278 Other lack of coordination: Secondary | ICD-10-CM | POA: Diagnosis not present

## 2019-09-05 DIAGNOSIS — R29898 Other symptoms and signs involving the musculoskeletal system: Secondary | ICD-10-CM | POA: Diagnosis not present

## 2019-09-05 DIAGNOSIS — R2689 Other abnormalities of gait and mobility: Secondary | ICD-10-CM

## 2019-09-05 NOTE — Therapy (Signed)
[place] [address] [place], [place], [address] Phone: [phone]   Fax:  [phone]  Physical Therapy Treatment  Patient Details  Name: Evan [name] MRN: [id number] Date of Birth: 1927-11-26 Referring Provider (PT): [name] MD    Encounter Date: 09/05/2019  PT End of Session - 09/05/19 1303    Visit Number  16    Number of Visits  20    Date for PT Re-Evaluation  09/20/19    Authorization Type  [employer] Medicare (submitted for 8 visit extension on 08/15/19)    Authorization Time Period  08/23/19-09/20/19    Authorization - Visit Number  4    Authorization - Number of Visits  8    Progress Note Due on Visit  20    PT Start Time  0916    PT Stop Time  1000    PT Time Calculation (min)  44 min    Equipment Utilized During Treatment  Gait belt    Activity Tolerance  Patient limited by fatigue    Behavior During Therapy  [facility] for tasks assessed/performed       Past Medical History:  Diagnosis Date   Adenocarcinoma of right lung ([place]) 01/06/2016   Anginal chest pain at rest [facility])    Chronic non, controlled on Lorazepam   Anxiety    Arthritis    "all over"    BPH (benign prostatic hyperplasia)    CAD (coronary artery disease)    Chronic lower back pain    Complication of anesthesia    "problems making water afterwards"   Depression    DJD (degenerative joint disease)    Esophageal ulcer without bleeding    bid ppi indefinitely   Family history of adverse reaction to anesthesia    "children w/PONV"   GERD (gastroesophageal reflux disease)    Headache    "couple/week maybe" (09/04/2015)   Hemochromatosis    Possible, elevated iron stores, hook-like osteophytes on hand films, normal LFTs   Hepatitis    "yellow jaundice as a baby"   History of ASCVD    MULTIVESSEL   Hyperlipidemia    Hypertension    Osteoarthritis    Pneumonia    "several times; got a little now" (09/04/2015)   Rheumatoid  arthritis ([place])    Subdural hematoma ([place])    small after fall 09/2016-plavix held, neurosurgery consulted.  Asymptomatic.     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[place]) 02/2018   on Lovenox lifelong since he failed PO Eliquis    Past Surgical History:  Procedure Laterality Date   ARM SKIN LESION BIOPSY / EXCISION Left 09/02/2015   CATARACT EXTRACTION W/ INTRAOCULAR LENS  IMPLANT, BILATERAL Bilateral    CHOLECYSTECTOMY OPEN     CORNEAL TRANSPLANT Bilateral    "one at [facility]; one at [facility]"   [place] W/ STENT     DE stent ostium into the right radial free graft at OM-, 02-2007   ESOPHAGOGASTRODUODENOSCOPY N/A 11/09/2014   Procedure: ESOPHAGOGASTRODUODENOSCOPY (EGD);  Surgeon: [name], MD;  Location: [facility] ENDOSCOPY;  Service: Endoscopy;  Laterality: N/A;   INGUINAL HERNIA REPAIR     JOINT REPLACEMENT     LEFT HEART CATH AND CORS/GRAFTS ANGIOGRAPHY N/A 06/27/2017   Procedure: LEFT HEART CATH AND CORS/GRAFTS ANGIOGRAPHY;  Surgeon: [name], MD;  Location: [place] CV LAB;  Service: Cardiovascular;  Laterality: N/A;  NASAL SINUS SURGERY     SHOULDER OPEN ROTATOR CUFF REPAIR Right    TOTAL KNEE ARTHROPLASTY Bilateral     There were no vitals filed for this visit.  Subjective Assessment - 09/05/19 0921    Subjective  Pt stated he has some pain Rt LE, 7/10.  Reports has been walking ~60 feet at home.    Pertinent History  CVA effecting RT side on 07/13/18, lumbar fracture December 2021    Patient Stated Goals  Be able to walk    Currently in Pain?  Yes    Pain Score  7     Pain Location  Leg    Pain Orientation  Right    Pain Descriptors / Indicators  Sore    Pain Type  Chronic pain    Pain Onset  More than a month ago    Pain Frequency  Intermittent    Aggravating Factors   movement    Pain Relieving Factors  rest    Effect of Pain on Daily Activities  mod effect                         [facility] Adult PT Treatment/Exercise - 09/05/19 0001      Ambulation/Gait   Ambulation/Gait  Yes    Ambulation/Gait Assistance  4: Min guard    Ambulation Distance (Feet)  226 Feet    Assistive device  Rolling walker    Gait Pattern  Step-to pattern;Decreased step length - right;Decreased step length - left;Decreased stance time - right;Decreased stride length;Decreased dorsiflexion - right;Ataxic;Trunk flexed    Ambulation Surface  Level;Indoor    Gait Comments  3 seated rest breaks      Knee/Hip Exercises: Standing   Functional Squat  2 sets;5 reps    Gait Training  246ft with RW, daughter pushing WC behind.  3 seated rest breaks due to fatigue    Other Standing Knee Exercises  standing tolerance fatigue at 45"; sidestep and retro gait inside//bars    Other Standing Knee Exercises  4 inch box taps, HHA x 2, x15 reps              PT Education - 09/05/19 0937    Education Details  Educated on benefits of aircast    Person(s) Educated  Patient;Spouse    Methods  Explanation    Comprehension  Verbalized understanding       PT Short Term Goals - 08/15/19 1051      PT SHORT TERM GOAL #1   Title  Patient will be independent with initial HEP to improve functional outcomes    Baseline  Reports compliance    Time  3    Period  Weeks    Status  Achieved    Target Date  08/02/19      PT SHORT TERM GOAL #2   Title  Patient will be able to perform stand x 5 in < 40 seconds to demonstrate improvement in functional mobility and reduced risk for falls.    Baseline  current 55 sec    Time  3    Period  Weeks    Status  On-going    Target Date  08/02/19        PT Long Term Goals - 08/15/19 1051      PT LONG TERM GOAL #1   Title  Patient will be able to perform stand x 5 in < 25 seconds to demonstrate improvement in functional  mobility and reduced risk for falls.    Baseline  current 55 sec    Time  6    Period  Weeks    Status  On-going       PT LONG TERM GOAL #2   Title  Patient will be able to perform TUG in <15 seconds, using least restrictive assistive device, to demonstrate improvement in functional mobility and reduced risk for falls.    Baseline  current 65 sec with RW    Time  6    Period  Weeks    Status  On-going      PT LONG TERM GOAL #3   Title  Patient will have equal to or > 4/5 MMT throughout BLE to improve ability to perform functional mobility, stair ambulation and ADLs.    Baseline  See MMT    Time  6    Period  Weeks    Status  Achieved            Plan - 09/05/19 1304    Clinical Impression Statement  Pt continues to fatigue quickly requiring seated rest breaks.  Improved distance with gait training this session prior need to sit.  Pt educated on benefits of aircast to assist with Rt foot inversion with weight bearing.  Pt improved gait mechanics with cueing to "push down with big toe" to reduce inversion.  Added standing tolerance this session, pt able to stand for 45" prior need to sit.  Min A required for safety with standing activiites, daugther pushed WC behind during gait.    Personal Factors and Comorbidities  Age;Comorbidity 2    Comorbidities  HX ov CVA, lumbar fracture    Examination-Activity Limitations  Bathing;Bed Mobility;Bend;Squat;Stairs;Carry;Stand;Toileting;Dressing;Transfers;Hygiene/Grooming;Lift;Locomotion Level    Examination-Participation Restrictions  Community Activity;Other;Cleaning    Stability/Clinical Decision Making  Stable/Uncomplicated    Clinical Decision Making  Low    Rehab Potential  Fair    PT Frequency  2x / week    PT Duration  6 weeks    PT Treatment/Interventions  ADLs/Self Care Home Management;Aquatic Therapy;Biofeedback;Cryotherapy;Electrical Stimulation;Iontophoresis 4mg /ml Dexamethasone;Moist Heat;Traction;Balance training;Manual techniques;Therapeutic exercise;Vasopneumatic Device;Wheelchair mobility training;Therapeutic  activities;Splinting;Taping;Functional mobility training;Stair training;Orthotic Fit/Training;Gait training;DME Instruction;Patient/family education;Passive range of motion;Dry needling;Energy conservation;Other (comment);Joint Manipulations;Spinal Manipulations;Compression bandaging;Neuromuscular re-education;Cognitive remediation;Ultrasound;Parrafin;Fluidtherapy;Contrast Bath    PT Next Visit Plan  continue with static balance and gait progression as tolerated. Continue step ups next visit if tolerated    PT Home Exercise Plan  07/10/19: seated march, LAQs, seated heel/toe raise; 07/15/19: unsupported sitting 1-2 min rounds; 08/15/19: static standing at sink or counter 09/04/19: seated windsheild wiper, ankle eversion with RTB    Consulted and Agree with Plan of Care  Patient;Family member/caregiver    Family Member Consulted  Daughter       Patient will benefit from skilled therapeutic intervention in order to improve the following deficits and impairments:  Abnormal gait, Decreased endurance, Hypomobility, Impaired sensation, Decreased activity tolerance, Decreased strength, Impaired UE functional use, Decreased balance, Decreased mobility, Difficulty walking, Decreased coordination, Decreased safety awareness, Postural dysfunction  Visit Diagnosis: Other abnormalities of gait and mobility  Muscle weakness (generalized)     Problem List Patient Active Problem List   Diagnosis Date Noted   Sundowning 07/04/2019   Bilateral hearing loss 04/16/2019   Urinary frequency 03/21/2019   Chronic right shoulder pain 12/19/2018   Weakness generalized 09/28/2018   Palliative care encounter 09/28/2018   Pressure injury of skin 08/27/2018   COPD (chronic obstructive pulmonary disease) ([place]) 08/26/2018   AMS (  altered mental status) 08/22/2018   Altered mental status 08/21/2018   UTI (urinary tract infection) 08/21/2018   Abnormality of gait 08/20/2018   Prolonged Q-T interval on ECG     Dysphagia, post-stroke    Supplemental oxygen dependent    Anxiety state    Fatigue    SOB (shortness of breath)    Hyperglycemia    History of lung cancer    Depression    CKD (chronic kidney disease), stage III    Acute blood loss anemia    Fall 07/16/2018   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   Acute spontaneous 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 ([place]) L thalamic 07/14/2018   Cellulitis of arm, right 06/01/2018   Rash and nonspecific skin eruption 06/01/2018   Chest pain 02/02/2018   Thrombocytopenia ([place]) 02/02/2018   Hypokalemia 02/02/2018   Pulmonary embolism ([place]) 02/02/2018   Elevated troponin 02/02/2018   Pulmonary embolus ([place]) 02/02/2018   Non-ST elevation MI (NSTEMI) ([place]) 38/18/4037   Diastolic CHF ([place]) 54/36/0677   Chest pain due to CAD ([place]) 06/26/2017   Dyspnea 06/21/2017   Acute bronchitis 05/15/2017   Oral candidiasis 05/15/2017   Acute pericarditis    Chest pain at rest 12/20/2016   Angina pectoris ([place]) 12/20/2016   Radiation pneumonitis ([place]) 11/09/2016   Pleurisy 10/11/2016   Chest wall pain 10/11/2016   Atrial fibrillation ([place]) 05/21/2016   Adenocarcinoma of right lung ([place]) 01/06/2016   GAD (generalized anxiety disorder) 11/20/2015   Essential hypertension    Cellulitis 09/04/2015   Cellulitis of left axilla    Esophageal ulcer without bleeding    Bilateral lower extremity edema 04/28/2014   BPH (benign prostatic hyperplasia) 10/08/2012   Anxiety    EKG abnormalities 09/05/2012   Tremor 09/05/2012   Chronic fatigue 09/05/2012   Arthritis    Asthma, chronic    Reflux esophagitis    S/P CABG (coronary artery bypass graft)    GERD (gastroesophageal reflux disease)    Hyperlipidemia    Hypertension    UNSPECIFIED PERIPHERAL VASCULAR DISEASE 06/16/2009   Ihor Austin, LPTA/CLT; CBIS [phone]  Aldona Lento 09/05/2019, 1:10 PM  [facility] [address] [place], [place], [address] Phone: [phone]   Fax:  [phone]  Name: JAMS TRICKETT MRN: [id number] Date of Birth: May 23, 1927

## 2019-09-06 ENCOUNTER — Ambulatory Visit
Admission: RE | Admit: 2019-09-06 | Discharge: 2019-09-06 | Disposition: A | Discharge status: Home / Self Care | Payer: Medicare HMO | Source: Ambulatory Visit | Attending: Student | Admitting: Student

## 2019-09-06 DIAGNOSIS — M48061 Spinal stenosis, lumbar region without neurogenic claudication: Secondary | ICD-10-CM | POA: Diagnosis not present

## 2019-09-06 DIAGNOSIS — S32010A Wedge compression fracture of first lumbar vertebra, initial encounter for closed fracture: Secondary | ICD-10-CM

## 2019-09-09 ENCOUNTER — Other Ambulatory Visit: Payer: Self-pay | Admitting: Family Medicine

## 2019-09-10 ENCOUNTER — Other Ambulatory Visit: Payer: Self-pay

## 2019-09-10 ENCOUNTER — Encounter (HOSPITAL_COMMUNITY): Payer: Self-pay | Admitting: Occupational Therapy

## 2019-09-10 ENCOUNTER — Encounter (HOSPITAL_COMMUNITY): Payer: Self-pay | Admitting: Physical Therapy

## 2019-09-10 ENCOUNTER — Ambulatory Visit (HOSPITAL_COMMUNITY): Payer: Medicare HMO | Admitting: Occupational Therapy

## 2019-09-10 ENCOUNTER — Ambulatory Visit (HOSPITAL_COMMUNITY): Payer: Medicare HMO | Admitting: Physical Therapy

## 2019-09-10 DIAGNOSIS — M6281 Muscle weakness (generalized): Secondary | ICD-10-CM

## 2019-09-10 DIAGNOSIS — R2689 Other abnormalities of gait and mobility: Secondary | ICD-10-CM

## 2019-09-10 DIAGNOSIS — R29898 Other symptoms and signs involving the musculoskeletal system: Secondary | ICD-10-CM

## 2019-09-10 DIAGNOSIS — R278 Other lack of coordination: Secondary | ICD-10-CM

## 2019-09-10 NOTE — Therapy (Signed)
[place], [place], [address] Phone: [phone]   Fax:  [phone]  Physical Therapy Treatment  Patient Details  Name: Evan [name] MRN: [id number] Date of Birth: 15-Oct-1927 Referring Provider (PT): [name] MD    Encounter Date: 09/10/2019  PT End of Session - 09/10/19 0821    Visit Number  17    Number of Visits  20    Date for PT Re-Evaluation  09/20/19    Authorization Type  [employer] (submitted for 8 visit extension on 08/15/19)    Authorization Time Period  08/23/19-09/20/19    Authorization - Visit Number  5    Authorization - Number of Visits  8    Progress Note Due on Visit  20    PT Start Time  0818    PT Stop Time  0900    PT Time Calculation (min)  42 min    Equipment Utilized During Treatment  Gait belt    Activity Tolerance  Patient limited by fatigue    Behavior During Therapy  [facility] for tasks assessed/performed       Past Medical History:  Diagnosis Date  . Adenocarcinoma of right lung ([place]) 01/06/2016  . Anginal chest pain at rest [facility])    Chronic non, controlled on Lorazepam  . Anxiety   . Arthritis    "all over"   . BPH (benign prostatic hyperplasia)   . CAD (coronary artery disease)   . Chronic lower back pain   . Complication of anesthesia    "problems making water afterwards"  . Depression   . DJD (degenerative joint disease)   . Esophageal ulcer without bleeding    bid ppi indefinitely  . Family history of adverse reaction to anesthesia    "children w/PONV"  . GERD (gastroesophageal reflux disease)   . Headache    "couple/week maybe" (09/04/2015)  . Hemochromatosis    Possible, elevated iron stores, hook-like osteophytes on hand films, normal LFTs  . Hepatitis    "yellow jaundice as a baby"  . History of ASCVD    MULTIVESSEL  . Hyperlipidemia   . Hypertension   . Osteoarthritis   . Pneumonia    "several times; got a little now" (09/04/2015)  . Rheumatoid  arthritis ([place])   . Subdural hematoma (HCC)    small after fall 09/2016-plavix held, neurosurgery consulted.  Asymptomatic.    Marland Kitchen 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 ARM SKIN LESION BIOPSY / EXCISION Left 09/02/2015  . CATARACT EXTRACTION W/ INTRAOCULAR LENS  IMPLANT, BILATERAL Bilateral   . CHOLECYSTECTOMY OPEN    . CORNEAL TRANSPLANT Bilateral    "one at [facility]; one at [facility]"  . CORONARY ANGIOPLASTY WITH STENT PLACEMENT    . CORONARY ARTERY BYPASS GRAFT  1999  . DESCENDING AORTIC ANEURYSM REPAIR W/ STENT     DE stent ostium into the right radial free graft at OM-, 02-2007  . ESOPHAGOGASTRODUODENOSCOPY N/A 11/09/2014   Procedure: ESOPHAGOGASTRODUODENOSCOPY (EGD);  Surgeon: [name], MD;  Location: [facility] ENDOSCOPY;  Service: Endoscopy;  Laterality: N/A;  . INGUINAL HERNIA REPAIR    . JOINT REPLACEMENT    . LEFT HEART CATH AND CORS/GRAFTS ANGIOGRAPHY N/A 06/27/2017   Procedure: LEFT HEART CATH AND CORS/GRAFTS ANGIOGRAPHY;  Surgeon: [name], MD;  Location: [place] CV LAB;  Service: Cardiovascular;  Laterality: N/A;  .  NASAL SINUS SURGERY    . SHOULDER OPEN ROTATOR CUFF REPAIR Right   . TOTAL KNEE ARTHROPLASTY Bilateral     There were no vitals filed for this visit.  Subjective Assessment - 09/10/19 0820    Subjective  Patient reports no new complaints. His daughter states they are waiting to hear back about an MRI he had on Friday. She says they tried using high top shoes instead of aircast mentioned at last visit, says this was helpful for walking.    Patient is accompained by:  Family member    Pertinent History  CVA effecting RT side on 07/13/18, lumbar fracture December 2021    Patient Stated Goals  Be able to walk    Currently in Pain?  No/denies    Pain Onset  More than a month ago                        [facility] Adult PT Treatment/Exercise - 09/10/19 0001       Knee/Hip Exercises: Standing   Forward Step Up  Both;5 reps;Hand Hold: 2;Step Height: 4"   only did 4 reps on RT due to fatigue    Gait Training  255ft with RW, WC follow, rest break x 3    Other Standing Knee Exercises  standing tolerance 2 x 30 sec     Other Standing Knee Exercises  4 inch box taps alternating x10 reps, standing obstacle step over (lateral) with half foam roll x 5 on RT       Knee/Hip Exercises: Seated   Other Seated Knee/Hip Exercises  seated obstacle step over RT side, 10 x with half foam     Sit to Sand  2 sets;5 reps;with UE support               PT Short Term Goals - 08/15/19 1051      PT SHORT TERM GOAL #1   Title  Patient will be independent with initial HEP to improve functional outcomes    Baseline  Reports compliance    Time  3    Period  Weeks    Status  Achieved    Target Date  08/02/19      PT SHORT TERM GOAL #2   Title  Patient will be able to perform stand x 5 in < 40 seconds to demonstrate improvement in functional mobility and reduced risk for falls.    Baseline  current 55 sec    Time  3    Period  Weeks    Status  On-going    Target Date  08/02/19        PT Long Term Goals - 08/15/19 1051      PT LONG TERM GOAL #1   Title  Patient will be able to perform stand x 5 in < 25 seconds to demonstrate improvement in functional mobility and reduced risk for falls.    Baseline  current 55 sec    Time  6    Period  Weeks    Status  On-going      PT LONG TERM GOAL #2   Title  Patient will be able to perform TUG in <15 seconds, using least restrictive assistive device, to demonstrate improvement in functional mobility and reduced risk for falls.    Baseline  current 65 sec with RW    Time  6    Period  Weeks    Status  On-going  PT LONG TERM GOAL #3   Title  Patient will have equal to or > 4/5 MMT throughout BLE to improve ability to perform functional mobility, stair ambulation and ADLs.    Baseline  See MMT    Time  6     Period  Weeks    Status  Achieved            Plan - 09/10/19 0859    Clinical Impression Statement  Patient shows improvement in total distance ambulated but continues to be significantly limited by fatigue. Patient cued on weight shift and foot placement with sit to stands. Patient had difficulty with added standing lateral obstacle step over with RT foot due to decreased ground clearance. Patient cued on lifting with hip for improved clearance of RT foot, but unable to improve due to weakness/ fatigue. Patient will continue to benefit from skilled therapy services to progress LE strength and balance to improve functional mobility and reduce risk for falls.    Personal Factors and Comorbidities  Age;Comorbidity 2    Comorbidities  HX ov CVA, lumbar fracture    Examination-Activity Limitations  Bathing;Bed Mobility;Bend;Squat;Stairs;Carry;Stand;Toileting;Dressing;Transfers;Hygiene/Grooming;Lift;Locomotion Level    Examination-Participation Restrictions  Community Activity;Other;Cleaning    Stability/Clinical Decision Making  Stable/Uncomplicated    Rehab Potential  Fair    PT Frequency  2x / week    PT Duration  6 weeks    PT Treatment/Interventions  ADLs/Self Care Home Management;Aquatic Therapy;Biofeedback;Cryotherapy;Electrical Stimulation;Iontophoresis 4mg /ml Dexamethasone;Moist Heat;Traction;Balance training;Manual techniques;Therapeutic exercise;Vasopneumatic Device;Wheelchair mobility training;Therapeutic activities;Splinting;Taping;Functional mobility training;Stair training;Orthotic Fit/Training;Gait training;DME Instruction;Patient/family education;Passive range of motion;Dry needling;Energy conservation;Other (comment);Joint Manipulations;Spinal Manipulations;Compression bandaging;Neuromuscular re-education;Cognitive remediation;Ultrasound;Parrafin;Fluidtherapy;Contrast Bath    PT Next Visit Plan  continue with static balance and gait progression as tolerated. Continue step ups next  visit if tolerated    PT Home Exercise Plan  07/10/19: seated march, LAQs, seated heel/toe raise; 07/15/19: unsupported sitting 1-2 min rounds; 08/15/19: static standing at sink or counter 09/04/19: seated windsheild wiper, ankle eversion with RTB    Consulted and Agree with Plan of Care  Patient;Family member/caregiver    Family Member Consulted  Daughter       Patient will benefit from skilled therapeutic intervention in order to improve the following deficits and impairments:  Abnormal gait, Decreased endurance, Hypomobility, Impaired sensation, Decreased activity tolerance, Decreased strength, Impaired UE functional use, Decreased balance, Decreased mobility, Difficulty walking, Decreased coordination, Decreased safety awareness, Postural dysfunction  Visit Diagnosis: Other abnormalities of gait and mobility  Muscle weakness (generalized)     Problem List Patient Active Problem List   Diagnosis Date Noted  . Sundowning 07/04/2019  . Bilateral hearing loss 04/16/2019  . Urinary frequency 03/21/2019  . Chronic right shoulder pain 12/19/2018  . Weakness generalized 09/28/2018  . Palliative care encounter 09/28/2018  . Pressure injury of skin 08/27/2018  . COPD (chronic obstructive pulmonary disease) ([place]) 08/26/2018  . AMS (altered mental status) 08/22/2018  . Altered mental status 08/21/2018  . UTI (urinary tract infection) 08/21/2018  . Abnormality of gait 08/20/2018  . Prolonged Q-T interval on ECG   . Dysphagia, post-stroke   . Supplemental oxygen dependent   . Anxiety state   . Fatigue   . SOB (shortness of breath)   . Hyperglycemia   . History of lung cancer   . Depression   . CKD (chronic kidney disease), stage III   . Acute blood loss anemia   . Fall 07/16/2018  . 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 Acute  spontaneous 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 ([place]) L thalamic 07/14/2018  .  Cellulitis of arm, right 06/01/2018  . Rash and nonspecific skin eruption 06/01/2018  . Chest pain 02/02/2018  . Thrombocytopenia ([place]) 02/02/2018  . Hypokalemia 02/02/2018  . Pulmonary embolism ([place]) 02/02/2018  . Elevated troponin 02/02/2018  . Pulmonary embolus ([place]) 02/02/2018  . Non-ST elevation MI (NSTEMI) ([place]) 06/27/2017  . Diastolic CHF ([place]) 37/01/6268  . Chest pain due to CAD ([place]) 06/26/2017  . Dyspnea 06/21/2017  . Acute bronchitis 05/15/2017  . Oral candidiasis 05/15/2017  . Acute pericarditis   . Chest pain at rest 12/20/2016  . Angina pectoris ([place]) 12/20/2016  . Radiation pneumonitis ([place]) 11/09/2016  . Pleurisy 10/11/2016  . Chest wall pain 10/11/2016  . Atrial fibrillation ([place]) 05/21/2016  . Adenocarcinoma of right lung ([place]) 01/06/2016  . GAD (generalized anxiety disorder) 11/20/2015  . Essential hypertension   . Cellulitis 09/04/2015  . Cellulitis of left axilla   . Esophageal ulcer without bleeding   . Bilateral lower extremity edema 04/28/2014  . BPH (benign prostatic hyperplasia) 10/08/2012  . Anxiety   . EKG abnormalities 09/05/2012  . Tremor 09/05/2012  . Chronic fatigue 09/05/2012  . Arthritis   . Asthma, chronic   . Reflux esophagitis   . S/P CABG (coronary artery bypass graft)   . GERD (gastroesophageal reflux disease)   . Hyperlipidemia   . Hypertension   . UNSPECIFIED PERIPHERAL VASCULAR DISEASE 06/16/2009   10:53 AM, 09/10/19 Josue Hector PT DPT  Physical Therapist with [facility]  [phone] [place] [address] [place], [place], [address] Phone: [phone]   Fax:  [phone]  Name: Evan [name] MRN: [id number] Date of Birth: Jun 16, 1927

## 2019-09-10 NOTE — Therapy (Addendum)
[place] [address] [place], [place], [address] Phone: [phone]   Fax:  [phone]  Occupational Therapy Treatment  Patient Details  Name: Evan [name] MRN: [id number] Date of Birth: May 08, 1927 Referring Provider (OT): [name]  Progress Note Reporting Period 07/30/2019 to 09/10/2019  See note below for Objective Data and Assessment of Progress/Goals.    Encounter Date: 09/10/2019  OT End of Session - 09/10/19 1250    Visit Number  10    Number of Visits  [place] Medicare [employer]    Authorization Time Period  12 visits approved (07/30/19-09/10/19) $40 copay    Authorization - Visit Number  10    Authorization - Number of Visits  12    Progress Note Due on Visit  10    OT Start Time  0901    OT Stop Time  0941    OT Time Calculation (min)  40 min    Activity Tolerance  Patient tolerated treatment well    Behavior During Therapy  [facility] for tasks assessed/performed       Past Medical History:  Diagnosis Date  . Adenocarcinoma of right lung ([place]) 01/06/2016  . Anginal chest pain at rest [facility])    Chronic non, controlled on Lorazepam  . Anxiety   . Arthritis    "all over"   . BPH (benign prostatic hyperplasia)   . CAD (coronary artery disease)   . Chronic lower back pain   . Complication of anesthesia    "problems making water afterwards"  . Depression   . DJD (degenerative joint disease)   . Esophageal ulcer without bleeding    bid ppi indefinitely  . Family history of adverse reaction to anesthesia    "children w/PONV"  . GERD (gastroesophageal reflux disease)   . Headache    "couple/week maybe" (09/04/2015)  . Hemochromatosis    Possible, elevated iron stores, hook-like osteophytes on hand films, normal LFTs  . Hepatitis    "yellow jaundice as a baby"  . History of ASCVD    MULTIVESSEL  . Hyperlipidemia   . Hypertension   . Osteoarthritis   . Pneumonia    "several times; got a little  now" (09/04/2015)  . Rheumatoid arthritis ([place])   . Subdural hematoma (HCC)    small after fall 09/2016-plavix held, neurosurgery consulted.  Asymptomatic.    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 ARM SKIN LESION BIOPSY / EXCISION Left 09/02/2015  . CATARACT EXTRACTION W/ INTRAOCULAR LENS  IMPLANT, BILATERAL Bilateral   . CHOLECYSTECTOMY OPEN    . CORNEAL TRANSPLANT Bilateral    "one at [facility]; one at [facility]"  . CORONARY ANGIOPLASTY WITH STENT PLACEMENT    . CORONARY ARTERY BYPASS GRAFT  1999  . DESCENDING AORTIC ANEURYSM REPAIR W/ STENT     DE stent ostium into the right radial free graft at OM-, 02-2007  . ESOPHAGOGASTRODUODENOSCOPY N/A 11/09/2014   Procedure: ESOPHAGOGASTRODUODENOSCOPY (EGD);  Surgeon: [name], MD;  Location: [facility] ENDOSCOPY;  Service: Endoscopy;  Laterality: N/A;  . INGUINAL HERNIA REPAIR    . JOINT REPLACEMENT    . LEFT HEART CATH AND CORS/GRAFTS ANGIOGRAPHY N/A 06/27/2017   Procedure: LEFT HEART CATH AND CORS/GRAFTS ANGIOGRAPHY;  Surgeon: [name], MD;  Location: [place] CV LAB;  Service: Cardiovascular;  Laterality: N/A;  . NASAL  SINUS SURGERY    . SHOULDER OPEN ROTATOR CUFF REPAIR Right   . TOTAL KNEE ARTHROPLASTY Bilateral     There were no vitals filed for this visit.  Subjective Assessment - 09/10/19 0904    Subjective   S. "I walked good today." Daughter reports pt has been demonstrating smoother reach patterns at home such as when reaching for light switch.    Patient is accompanied by:  Family member   Daughter   Currently in Pain?  No/denies                   OT Treatments/Exercises (OP) - 09/10/19 0907      Exercises   Exercises  Shoulder      Shoulder Exercises: Therapy Ball   Flexion  10 reps;Both    Flexion Limitations  cues for palm flat      Shoulder Exercises: Stretch   Table Stretch - Flexion  Other (comment)   10  reps at begining of session and 10 reps at end   Table Stretch -Flexion Limitations  Cues to maintain palm on table      Neurological Re-education Exercises   Other Exercises 1  At beginning and end of session, pt participating in metronome task. 40 BPM. Alternating Rue and then LUE tapping on table. Min-Mod A for managing RUE. Rest breaks between. 3 reps at beginnign and 3 reps at end of session.    Other Grasp and Release Exercises   Ball peg task. Focused on breaking reach pattern down into steps of open hand, reach, and squeeze ball. Pt initially requiring Min A for normalized reach pattern and to decrease ataxia. Pt transitioning to [employer] level. Pt transferring pegs to/from boards 3x. VCs throughout for positioning, control, and breaks.                OT Short Term Goals - 08/01/19 1310      OT SHORT TERM GOAL #1   Title  Patient and family will be educated and verbalize independence with HEP in order to increase functional use of his right hand while using for 25% or more of daily tasks.    Time  6    Period  Weeks    Status  On-going    Target Date  09/10/19      OT SHORT TERM GOAL #2   Title  Patient will increase scapular and proximal shoulder strengthening while demonstrating increased shoulder endurance during a sustained functional reaching task for more than 1 minute.    Time  6    Period  Weeks    Status  On-going      OT SHORT TERM GOAL #3   Title  Patient will increase gross motor coordination by moving 10 or more blocks during the blocks and box test allow him to complete self feeding tasks with less difficulty.    Time  6    Period  Weeks    Status  On-going      OT SHORT TERM GOAL #4   Title  Patient will increase his right hand grip strength by 6# and his pinch strength by 3# in order to hold his utensil during meals without dropping or fatigue.    Time  6    Period  Weeks    Status  On-going      OT SHORT TERM GOAL #5   Title  Patient will be  educated on adaptive eqipment/strategies that may help improve overall motor control  when completing basic self care tasks.    Time  6    Period  Weeks    Status  On-going               Plan - 09/10/19 1250    Clinical Impression Statement  A: Continued using metronome for targeted tapping at beginning and end of session. Pt performing forward flexion and stretch on table and with therapy ball; cues to maintaining palm flat on table and on ball. Focused session on targeted reach; pt performing reach to ball pegs and then placing them in hole board; 3x. Pt demonstrating increased control this session to perform funcitonal reach with Min A to Min guard A (last session requiring Mod A).    Body Structure / Function / Physical Skills  ADL;UE functional use;FMC;GMC;Coordination;IADL;Dexterity;Strength    Plan  P: Continue metronome task at beginning and end of session. Continue to focus on motor planning for targeted reach and ADLs.    Consulted and Agree with Plan of Care  Patient;Family member/caregiver    Family Member Consulted  Daughter; [name]       Patient will benefit from skilled therapeutic intervention in order to improve the following deficits and impairments:   Body Structure / Function / Physical Skills: ADL, UE functional use, FMC, GMC, Coordination, IADL, Dexterity, Strength       Visit Diagnosis: Other lack of coordination  Other symptoms and signs involving the musculoskeletal system    Problem List Patient Active Problem List   Diagnosis Date Noted  . Sundowning 07/04/2019  . Bilateral hearing loss 04/16/2019  . Urinary frequency 03/21/2019  . Chronic right shoulder pain 12/19/2018  . Weakness generalized 09/28/2018  . Palliative care encounter 09/28/2018  . Pressure injury of skin 08/27/2018  . COPD (chronic obstructive pulmonary disease) ([place]) 08/26/2018  . AMS (altered mental status) 08/22/2018  . Altered mental status 08/21/2018  . UTI (urinary  tract infection) 08/21/2018  . Abnormality of gait 08/20/2018  . Prolonged Q-T interval on ECG   . Dysphagia, post-stroke   . Supplemental oxygen dependent   . Anxiety state   . Fatigue   . SOB (shortness of breath)   . Hyperglycemia   . History of lung cancer   . Depression   . CKD (chronic kidney disease), stage III   . Acute blood loss anemia   . Fall 07/16/2018  . 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 Acute spontaneous 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 ([place]) L thalamic 07/14/2018  . Cellulitis of arm, right 06/01/2018  . Rash and nonspecific skin eruption 06/01/2018  . Chest pain 02/02/2018  . Thrombocytopenia ([place]) 02/02/2018  . Hypokalemia 02/02/2018  . Pulmonary embolism ([place]) 02/02/2018  . Elevated troponin 02/02/2018  . Pulmonary embolus ([place]) 02/02/2018  . Non-ST elevation MI (NSTEMI) ([place]) 06/27/2017  . Diastolic CHF ([place]) 33/82/5053  . Chest pain due to CAD ([place]) 06/26/2017  . Dyspnea 06/21/2017  . Acute bronchitis 05/15/2017  . Oral candidiasis 05/15/2017  . Acute pericarditis   . Chest pain at rest 12/20/2016  . Angina pectoris ([place]) 12/20/2016  . Radiation pneumonitis ([place]) 11/09/2016  . Pleurisy 10/11/2016  . Chest wall pain 10/11/2016  . Atrial fibrillation ([place]) 05/21/2016  . Adenocarcinoma of right lung ([place]) 01/06/2016  . GAD (generalized anxiety disorder) 11/20/2015  . Essential hypertension   . Cellulitis 09/04/2015  . Cellulitis of left axilla   . Esophageal ulcer without bleeding   . Bilateral  lower extremity edema 04/28/2014  . BPH (benign prostatic hyperplasia) 10/08/2012  . Anxiety   . EKG abnormalities 09/05/2012  . Tremor 09/05/2012  . Chronic fatigue 09/05/2012  . Arthritis   . Asthma, chronic   . Reflux esophagitis   . S/P CABG (coronary artery bypass graft)   . GERD (gastroesophageal reflux disease)   . Hyperlipidemia   . Hypertension   . UNSPECIFIED  PERIPHERAL VASCULAR DISEASE 06/16/2009    Neal Dy, MSOT, OTR/L 09/10/2019, 3:07 PM  [facility] [address] [place], [place], [address] Phone: [phone]   Fax:  [phone]  Name: Evan [name] MRN: [id number] Date of Birth: [birth year]

## 2019-09-12 ENCOUNTER — Other Ambulatory Visit: Payer: Self-pay

## 2019-09-12 ENCOUNTER — Ambulatory Visit (HOSPITAL_COMMUNITY): Payer: Medicare HMO | Admitting: Physical Therapy

## 2019-09-12 ENCOUNTER — Encounter (HOSPITAL_COMMUNITY): Payer: Self-pay | Admitting: Occupational Therapy

## 2019-09-12 ENCOUNTER — Ambulatory Visit (HOSPITAL_COMMUNITY): Payer: Medicare HMO | Admitting: Occupational Therapy

## 2019-09-12 ENCOUNTER — Encounter (HOSPITAL_COMMUNITY): Payer: Self-pay | Admitting: Physical Therapy

## 2019-09-12 DIAGNOSIS — R278 Other lack of coordination: Secondary | ICD-10-CM

## 2019-09-12 DIAGNOSIS — R2689 Other abnormalities of gait and mobility: Secondary | ICD-10-CM | POA: Diagnosis not present

## 2019-09-12 DIAGNOSIS — M6281 Muscle weakness (generalized): Secondary | ICD-10-CM | POA: Diagnosis not present

## 2019-09-12 DIAGNOSIS — R29898 Other symptoms and signs involving the musculoskeletal system: Secondary | ICD-10-CM

## 2019-09-12 NOTE — Therapy (Signed)
[place], [place], [address] Phone: [phone]   Fax:  [phone]  Physical Therapy Treatment  Patient Details  Name: Evan [name] MRN: [id number] Date of Birth: May 18, 1927 Referring Provider (PT): [name] MD    Encounter Date: 09/12/2019   PT End of Session - 09/12/19 0953    Visit Number 18    Number of Visits 20    Date for PT Re-Evaluation 09/20/19    Authorization Type [employer] (submitted for 8 visit extension on 08/15/19)    Authorization Time Period 08/23/19-09/20/19    Authorization - Visit Number 6    Authorization - Number of Visits 8    Progress Note Due on Visit 20    PT Start Time 0949    PT Stop Time 1028    PT Time Calculation (min) 39 min    Equipment Utilized During Treatment Gait belt    Activity Tolerance Patient limited by fatigue    Behavior During Therapy [facility] for tasks assessed/performed           Past Medical History:  Diagnosis Date  . Adenocarcinoma of right lung ([place]) 01/06/2016  . Anginal chest pain at rest [facility])    Chronic non, controlled on Lorazepam  . Anxiety   . Arthritis    "all over"   . BPH (benign prostatic hyperplasia)   . CAD (coronary artery disease)   . Chronic lower back pain   . Complication of anesthesia    "problems making water afterwards"  . Depression   . DJD (degenerative joint disease)   . Esophageal ulcer without bleeding    bid ppi indefinitely  . Family history of adverse reaction to anesthesia    "children w/PONV"  . GERD (gastroesophageal reflux disease)   . Headache    "couple/week maybe" (09/04/2015)  . Hemochromatosis    Possible, elevated iron stores, hook-like osteophytes on hand films, normal LFTs  . Hepatitis    "yellow jaundice as a baby"  . History of ASCVD    MULTIVESSEL  . Hyperlipidemia   . Hypertension   . Osteoarthritis   . Pneumonia    "several times; got a little now" (09/04/2015)  . Rheumatoid arthritis  ([place])   . Subdural hematoma (HCC)    small after fall 09/2016-plavix held, neurosurgery consulted.  Asymptomatic.    Marland Kitchen 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[place]) 02/2018   on Lovenox lifelong since he failed PO Eliquis    Past Surgical History:  Procedure Laterality Date  . ARM SKIN LESION BIOPSY / EXCISION Left 09/02/2015  . CATARACT EXTRACTION W/ INTRAOCULAR LENS  IMPLANT, BILATERAL Bilateral   . CHOLECYSTECTOMY OPEN    . CORNEAL TRANSPLANT Bilateral    "one at [facility]; one at [facility]"  . CORONARY ANGIOPLASTY WITH STENT PLACEMENT    . CORONARY ARTERY BYPASS GRAFT  1999  . DESCENDING AORTIC ANEURYSM REPAIR W/ STENT     DE stent ostium into the right radial free graft at OM-, 02-2007  . ESOPHAGOGASTRODUODENOSCOPY N/A 11/09/2014   Procedure: ESOPHAGOGASTRODUODENOSCOPY (EGD);  Surgeon: [name], MD;  Location: [facility] ENDOSCOPY;  Service: Endoscopy;  Laterality: N/A;  . INGUINAL HERNIA REPAIR    . JOINT REPLACEMENT    . LEFT HEART CATH AND CORS/GRAFTS ANGIOGRAPHY N/A 06/27/2017   Procedure: LEFT HEART CATH AND CORS/GRAFTS ANGIOGRAPHY;  Surgeon: [name], MD;  Location: [place] CV LAB;  Service: Cardiovascular;  Laterality: N/A;  . NASAL SINUS SURGERY    . SHOULDER OPEN  ROTATOR CUFF REPAIR Right   . TOTAL KNEE ARTHROPLASTY Bilateral     There were no vitals filed for this visit.   Subjective Assessment - 09/12/19 0952    Subjective Patient says his RT arm and leg are bothering him today. Not sure why. "Hurts when I move"    Patient is accompained by: Family member    Pertinent History CVA effecting RT side on 07/13/18, lumbar fracture December 2021    Patient Stated Goals Be able to walk    Currently in Pain? Yes    Pain Score 5     Pain Location Leg    Pain Orientation Right    Pain Descriptors / Indicators Aching    Pain Onset More than a month ago                             [facility] Adult PT Treatment/Exercise - 09/12/19 0001      Knee/Hip Exercises: Standing   Forward Step Up  Both;5 reps;Hand Hold: 2;Step Height: 4"   3 reps on RT due to fatigue    Gait Training 276ft with RW, WC follow, rest break x 2    Other Standing Knee Exercises sidestepping in //  2RT     Other Standing Knee Exercises 4 inch box taps alternating x10 reps, standing obstacle step over (lateral) with 4 inch hurdle x 5 on RT       Knee/Hip Exercises: Seated   Other Seated Knee/Hip Exercises seated obstacle step over RT side, 10 x with 4 inch hurdle    Sit to Sand 2 sets;5 reps;with UE support                    PT Short Term Goals - 08/15/19 1051      PT SHORT TERM GOAL #1   Title Patient will be independent with initial HEP to improve functional outcomes    Baseline Reports compliance    Time 3    Period Weeks    Status Achieved    Target Date 08/02/19      PT SHORT TERM GOAL #2   Title Patient will be able to perform stand x 5 in < 40 seconds to demonstrate improvement in functional mobility and reduced risk for falls.    Baseline current 55 sec    Time 3    Period Weeks    Status On-going    Target Date 08/02/19             PT Long Term Goals - 08/15/19 1051      PT LONG TERM GOAL #1   Title Patient will be able to perform stand x 5 in < 25 seconds to demonstrate improvement in functional mobility and reduced risk for falls.    Baseline current 55 sec    Time 6    Period Weeks    Status On-going      PT LONG TERM GOAL #2   Title Patient will be able to perform TUG in <15 seconds, using least restrictive assistive device, to demonstrate improvement in functional mobility and reduced risk for falls.    Baseline current 65 sec with RW    Time 6    Period Weeks    Status On-going      PT LONG TERM GOAL #3   Title Patient will have equal to or > 4/5 MMT throughout BLE to improve ability to perform functional mobility,  stair ambulation and ADLs.    Baseline See MMT    Time 6    Period Weeks    Status Achieved                 Plan - 09/12/19 1049     Clinical Impression Statement Patient with improved ambulation distance today. Patient comes to session donning boots with ankle support, as discussed in prior visits. Patient appears to have improved tolerance to functional mobility with this method. Patient continues to be fatigued with prolonged standing and weight bearing activity, but is able to progress time and distance with frequent encouragement. Patient with improved coordination of RLE during step taps and hurdle step overs as well today. Educated patient and family member on increasing stand time at home to improve endurance and tolerance to functional activity. Patient will continue to benefit from skilled therapy services to improve LE strength and balance to reduce risk for future falls.    Personal Factors and Comorbidities Age;Comorbidity 2    Comorbidities HX ov CVA, lumbar fracture    Examination-Activity Limitations Bathing;Bed Mobility;Bend;Squat;Stairs;Carry;Stand;Toileting;Dressing;Transfers;Hygiene/Grooming;Lift;Locomotion Level    Examination-Participation Restrictions Community Activity;Other;Cleaning    Stability/Clinical Decision Making Stable/Uncomplicated    Rehab Potential Fair    PT Frequency 2x / week    PT Duration 6 weeks    PT Treatment/Interventions ADLs/Self Care Home Management;Aquatic Therapy;Biofeedback;Cryotherapy;Electrical Stimulation;Iontophoresis 4mg /ml Dexamethasone;Moist Heat;Traction;Balance training;Manual techniques;Therapeutic exercise;Vasopneumatic Device;Wheelchair mobility training;Therapeutic activities;Splinting;Taping;Functional mobility training;Stair training;Orthotic Fit/Training;Gait training;DME Instruction;Patient/family education;Passive range of motion;Dry needling;Energy conservation;Other (comment);Joint Manipulations;Spinal Manipulations;Compression bandaging;Neuromuscular re-education;Cognitive remediation;Ultrasound;Parrafin;Fluidtherapy;Contrast Bath    PT Next Visit Plan continue  with static balance and gait progression as tolerated. Continue work on obstacle ambulation as tolerated    PT Home Exercise Plan 07/10/19: seated march, LAQs, seated heel/toe raise; 07/15/19: unsupported sitting 1-2 min rounds; 08/15/19: static standing at sink or counter 09/04/19: seated windsheild wiper, ankle eversion with RTB    Consulted and Agree with Plan of Care Patient;Family member/caregiver    Family Member Consulted Daughter           Patient will benefit from skilled therapeutic intervention in order to improve the following deficits and impairments:  Abnormal gait, Decreased endurance, Hypomobility, Impaired sensation, Decreased activity tolerance, Decreased strength, Impaired UE functional use, Decreased balance, Decreased mobility, Difficulty walking, Decreased coordination, Decreased safety awareness, Postural dysfunction  Visit Diagnosis: Other abnormalities of gait and mobility  Muscle weakness (generalized)     Problem List Patient Active Problem List   Diagnosis Date Noted  . Sundowning 07/04/2019  . Bilateral hearing loss 04/16/2019  . Urinary frequency 03/21/2019  . Chronic right shoulder pain 12/19/2018  . Weakness generalized 09/28/2018  . Palliative care encounter 09/28/2018  . Pressure injury of skin 08/27/2018  . COPD (chronic obstructive pulmonary disease) ([place]) 08/26/2018  . AMS (altered mental status) 08/22/2018  . Altered mental status 08/21/2018  . UTI (urinary tract infection) 08/21/2018  . Abnormality of gait 08/20/2018  . Prolonged Q-T interval on ECG   . Dysphagia, post-stroke   . Supplemental oxygen dependent   . Anxiety state   . Fatigue   . SOB (shortness of breath)   . Hyperglycemia   . History of lung cancer   . Depression   . CKD (chronic kidney disease), stage III   . Acute blood loss anemia   . Fall 07/16/2018  . 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 Acute spontaneous intraparenchymal  intracranial hemorrhage associated with coagulopathy ([place]) L thalamic 07/14/2018  .  Cellulitis of arm, right 06/01/2018  . Rash and nonspecific skin eruption 06/01/2018  . Chest pain 02/02/2018  . Thrombocytopenia ([place]) 02/02/2018  . Hypokalemia 02/02/2018  . Pulmonary embolism ([place]) 02/02/2018  . Elevated troponin 02/02/2018  . Pulmonary embolus ([place]) 02/02/2018  . Non-ST elevation MI (NSTEMI) ([place]) 06/27/2017  . Diastolic CHF ([place]) 26/71/2458  . Chest pain due to CAD ([place]) 06/26/2017  . Dyspnea 06/21/2017  . Acute bronchitis 05/15/2017  . Oral candidiasis 05/15/2017  . Acute pericarditis   . Chest pain at rest 12/20/2016  . Angina pectoris ([place]) 12/20/2016  . Radiation pneumonitis ([place]) 11/09/2016  . Pleurisy 10/11/2016  . Chest wall pain 10/11/2016  . Atrial fibrillation ([place]) 05/21/2016  . Adenocarcinoma of right lung ([place]) 01/06/2016  . GAD (generalized anxiety disorder) 11/20/2015  . Essential hypertension   . Cellulitis 09/04/2015  . Cellulitis of left axilla   . Esophageal ulcer without bleeding   . Bilateral lower extremity edema 04/28/2014  . BPH (benign prostatic hyperplasia) 10/08/2012  . Anxiety   . EKG abnormalities 09/05/2012  . Tremor 09/05/2012  . Chronic fatigue 09/05/2012  . Arthritis   . Asthma, chronic   . Reflux esophagitis   . S/P CABG (coronary artery bypass graft)   . GERD (gastroesophageal reflux disease)   . Hyperlipidemia   . Hypertension   . UNSPECIFIED PERIPHERAL VASCULAR DISEASE 06/16/2009   10:52 AM, 09/12/19 Josue Hector PT DPT  Physical Therapist with [facility]  [phone] [place] [address] [place], [place], [address] Phone: [phone]   Fax:  [phone]  Name: Evan [name] MRN: [id number] Date of Birth: Feb 01, 1928

## 2019-09-12 NOTE — Patient Instructions (Signed)
Home Exercises Program Theraputty Exercises  Do the following exercises 1 times a day using your affected hand.  1. Roll putty into a ball.  2. Flatten and make into a pancake.  3. Roll putty into a roll with right hand.  4. Roll putty into log with both hands.   Yellow putty

## 2019-09-12 NOTE — Therapy (Signed)
[place], [place], [address] Phone: [phone]   Fax:  [phone]  Occupational Therapy Treatment  Patient Details  Name: Evan [name] MRN: [id number] Date of Birth: 03/15/1928 Referring Provider (OT): [name]   Encounter Date: 09/12/2019   OT End of Session - 09/12/19 1145    Visit Number 11    Number of Visits 12    Date for OT Re-Evaluation 09/10/19    Authorization Type [employer] HMO    Authorization Time Period 12 visits approved (07/30/19-09/10/19) $40 copay    Authorization - Visit Number 10    Authorization - Number of Visits 12    Progress Note Due on Visit 10    OT Start Time 1030    OT Stop Time 1116    OT Time Calculation (min) 46 min    Activity Tolerance Patient tolerated treatment well    Behavior During Therapy [facility] for tasks assessed/performed           Past Medical History:  Diagnosis Date  . Adenocarcinoma of right lung ([place]) 01/06/2016  . Anginal chest pain at rest [facility])    Chronic non, controlled on Lorazepam  . Anxiety   . Arthritis    "all over"   . BPH (benign prostatic hyperplasia)   . CAD (coronary artery disease)   . Chronic lower back pain   . Complication of anesthesia    "problems making water afterwards"  . Depression   . DJD (degenerative joint disease)   . Esophageal ulcer without bleeding    bid ppi indefinitely  . Family history of adverse reaction to anesthesia    "children w/PONV"  . GERD (gastroesophageal reflux disease)   . Headache    "couple/week maybe" (09/04/2015)  . Hemochromatosis    Possible, elevated iron stores, hook-like osteophytes on hand films, normal LFTs  . Hepatitis    "yellow jaundice as a baby"  . History of ASCVD    MULTIVESSEL  . Hyperlipidemia   . Hypertension   . Osteoarthritis   . Pneumonia    "several times; got a little now" (09/04/2015)  . Rheumatoid arthritis ([place])   . Subdural hematoma (HCC)    small after  fall 09/2016-plavix held, neurosurgery consulted.  Asymptomatic.    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 ARM SKIN LESION BIOPSY / EXCISION Left 09/02/2015  . CATARACT EXTRACTION W/ INTRAOCULAR LENS  IMPLANT, BILATERAL Bilateral   . CHOLECYSTECTOMY OPEN    . CORNEAL TRANSPLANT Bilateral    "one at [facility]; one at [facility]"  . CORONARY ANGIOPLASTY WITH STENT PLACEMENT    . CORONARY ARTERY BYPASS GRAFT  1999  . DESCENDING AORTIC ANEURYSM REPAIR W/ STENT     DE stent ostium into the right radial free graft at OM-, 02-2007  . ESOPHAGOGASTRODUODENOSCOPY N/A 11/09/2014   Procedure: ESOPHAGOGASTRODUODENOSCOPY (EGD);  Surgeon: [name], MD;  Location: [facility] ENDOSCOPY;  Service: Endoscopy;  Laterality: N/A;  . INGUINAL HERNIA REPAIR    . JOINT REPLACEMENT    . LEFT HEART CATH AND CORS/GRAFTS ANGIOGRAPHY N/A 06/27/2017   Procedure: LEFT HEART CATH AND CORS/GRAFTS ANGIOGRAPHY;  Surgeon: [name], MD;  Location: [place] CV LAB;  Service: Cardiovascular;  Laterality: N/A;  . NASAL SINUS SURGERY    . SHOULDER OPEN ROTATOR CUFF REPAIR Right   . TOTAL KNEE ARTHROPLASTY Bilateral  There were no vitals filed for this visit.   Subjective Assessment - 09/12/19 1129    Subjective  S: "Tired"                        OT Treatments/Exercises (OP) - 09/12/19 1129      Exercises   Exercises Shoulder;Theraputty      Shoulder Exercises: Stretch   Table Stretch - Flexion --   10 reps. x2   Table Stretch -Flexion Limitations Cues for palm on table. Min A for normalized movement patterns.       Theraputty   Theraputty - Flatten In standing, having pt weight bear through RUE and flatten yellow putty. 5 reps in standing. 3 sets with seated rest breaks between    Theraputty - Roll Rolling yellow into worm with RUE; 10x. Rolling yellow putty with BUEs; 10x      Neurological Re-education  Exercises   Other Exercises 1 At beginning and end of session, pt participating in metronome task. 40 BPM. Alternating Rue and then LUE tapping on table. Mod-Max A for managing RUE. Rest breaks between. 4 reps at beginnigng and 3 reps at end of session..    Other Grasp and Release Exercises  Ball peg task. Focused on breaking reach pattern down into steps of open hand, reach, and squeeze ball. Pt initially requiring Mod A for normalized reach pattern and to decrease ataxia. Pt transitioning to Min A. Pt transferring pegs to boards 3x. VCs throughout for positioning, control, and breaks.     Other Grasp and Release Exercises  Reaching to grasp cones from stack. Cues for reach pattern and placing palm on cone before grasp. 5 cones. restacking cones.                  OT Education - 09/12/19 1144    Education Details Provided yellow putty and verbal education on flatterning putty with RUE and rolling into log with RUE and BUEs.    Person(s) Educated Patient;Child(ren)    Methods Explanation;Demonstration    Comprehension Returned demonstration;Verbalized understanding            OT Short Term Goals - 08/01/19 1310      OT SHORT TERM GOAL #1   Title Patient and family will be educated and verbalize independence with HEP in order to increase functional use of his right hand while using for 25% or more of daily tasks.    Time 6    Period Weeks    Status On-going    Target Date 09/10/19      OT SHORT TERM GOAL #2   Title Patient will increase scapular and proximal shoulder strengthening while demonstrating increased shoulder endurance during a sustained functional reaching task for more than 1 minute.    Time 6    Period Weeks    Status On-going      OT SHORT TERM GOAL #3   Title Patient will increase gross motor coordination by moving 10 or more blocks during the blocks and box test allow him to complete self feeding tasks with less difficulty.    Time 6    Period Weeks    Status  On-going      OT SHORT TERM GOAL #4   Title Patient will increase his right hand grip strength by 6# and his pinch strength by 3# in order to hold his utensil during meals without dropping or fatigue.    Time 6    Period  Weeks    Status On-going      OT SHORT TERM GOAL #5   Title Patient will be educated on adaptive eqipment/strategies that may help improve overall motor control when completing basic self care tasks.    Time 6    Period Weeks    Status On-going                    Plan - 09/12/19 1145    Clinical Impression Statement A: Continued metronome for targetted tapping at beginning and end of session. Noting pt with increased ataxia at RUE this session. Focused on normalized movements patterns with forward flexion at shoulder and weight bearing through RUE. Pt demonstrating increased functional reach over last several session, and feel pt would benefit from further OT to optimize safety and independence with ADLs. Daughter also reporting that pt dmeonstrating progress with bADLs over last several weaks stating "I feel he is more smooth in his movement."    Body Structure / Function / Physical Skills ADL;UE functional use;FMC;GMC;Coordination;IADL;Dexterity;Strength    Plan P: Reassess. Continue metronome and functional reach.    Consulted and Agree with Plan of Care Patient;Family member/caregiver    Family Member Consulted Daughter; [name]           Patient will benefit from skilled therapeutic intervention in order to improve the following deficits and impairments:   Body Structure / Function / Physical Skills: ADL, UE functional use, FMC, GMC, Coordination, IADL, Dexterity, Strength       Visit Diagnosis: Other lack of coordination  Other symptoms and signs involving the musculoskeletal system    Problem List Patient Active Problem List   Diagnosis Date Noted  . Sundowning 07/04/2019  . Bilateral hearing loss 04/16/2019  . Urinary frequency  03/21/2019  . Chronic right shoulder pain 12/19/2018  . Weakness generalized 09/28/2018  . Palliative care encounter 09/28/2018  . Pressure injury of skin 08/27/2018  . COPD (chronic obstructive pulmonary disease) ([place]) 08/26/2018  . AMS (altered mental status) 08/22/2018  . Altered mental status 08/21/2018  . UTI (urinary tract infection) 08/21/2018  . Abnormality of gait 08/20/2018  . Prolonged Q-T interval on ECG   . Dysphagia, post-stroke   . Supplemental oxygen dependent   . Anxiety state   . Fatigue   . SOB (shortness of breath)   . Hyperglycemia   . History of lung cancer   . Depression   . CKD (chronic kidney disease), stage III   . Acute blood loss anemia   . Fall 07/16/2018  . 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 Acute spontaneous 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 ([place]) L thalamic 07/14/2018  . Cellulitis of arm, right 06/01/2018  . Rash and nonspecific skin eruption 06/01/2018  . Chest pain 02/02/2018  . Thrombocytopenia ([place]) 02/02/2018  . Hypokalemia 02/02/2018  . Pulmonary embolism ([place]) 02/02/2018  . Elevated troponin 02/02/2018  . Pulmonary embolus ([place]) 02/02/2018  . Non-ST elevation MI (NSTEMI) ([place]) 06/27/2017  . Diastolic CHF ([place]) 54/27/0623  . Chest pain due to CAD ([place]) 06/26/2017  . Dyspnea 06/21/2017  . Acute bronchitis 05/15/2017  . Oral candidiasis 05/15/2017  . Acute pericarditis   . Chest pain at rest 12/20/2016  . Angina pectoris ([place]) 12/20/2016  . Radiation pneumonitis ([place]) 11/09/2016  . Pleurisy 10/11/2016  . Chest wall pain 10/11/2016  . Atrial fibrillation ([place]) 05/21/2016  . Adenocarcinoma of right lung ([place]) 01/06/2016  . GAD (generalized anxiety disorder)  11/20/2015  . Essential hypertension   . Cellulitis 09/04/2015  . Cellulitis of left axilla   . Esophageal ulcer without bleeding   . Bilateral lower extremity edema 04/28/2014  . BPH (benign  prostatic hyperplasia) 10/08/2012  . Anxiety   . EKG abnormalities 09/05/2012  . Tremor 09/05/2012  . Chronic fatigue 09/05/2012  . Arthritis   . Asthma, chronic   . Reflux esophagitis   . S/P CABG (coronary artery bypass graft)   . GERD (gastroesophageal reflux disease)   . Hyperlipidemia   . Hypertension   . UNSPECIFIED PERIPHERAL VASCULAR DISEASE 06/16/2009    Neal Dy, MSOT,OTR/L 09/12/2019, 11:56 AM  [place] [address] [place], [place], [address] Phone: [phone]   Fax:  [phone]  Name: Evan [name] MRN: [id number] Date of Birth: 02/04/1928

## 2019-09-17 ENCOUNTER — Other Ambulatory Visit: Payer: Self-pay

## 2019-09-17 ENCOUNTER — Encounter (HOSPITAL_COMMUNITY): Payer: Self-pay | Admitting: Physical Therapy

## 2019-09-17 ENCOUNTER — Encounter (HOSPITAL_COMMUNITY): Payer: Self-pay | Admitting: Occupational Therapy

## 2019-09-17 ENCOUNTER — Ambulatory Visit (HOSPITAL_COMMUNITY): Payer: Medicare HMO | Admitting: Physical Therapy

## 2019-09-17 ENCOUNTER — Ambulatory Visit (HOSPITAL_COMMUNITY): Payer: Medicare HMO | Admitting: Occupational Therapy

## 2019-09-17 DIAGNOSIS — M6281 Muscle weakness (generalized): Secondary | ICD-10-CM | POA: Diagnosis not present

## 2019-09-17 DIAGNOSIS — R29898 Other symptoms and signs involving the musculoskeletal system: Secondary | ICD-10-CM

## 2019-09-17 DIAGNOSIS — R278 Other lack of coordination: Secondary | ICD-10-CM | POA: Diagnosis not present

## 2019-09-17 DIAGNOSIS — R2689 Other abnormalities of gait and mobility: Secondary | ICD-10-CM

## 2019-09-17 NOTE — Therapy (Signed)
[place], [place], [address] Phone: [phone]   Fax:  [phone]  Occupational Therapy Reassessment & Treatment (recertification) Patient Details  Name: Evan [name] MRN: [id number] Date of Birth: [birth year] Referring Provider (OT): [name]   Encounter Date: 09/17/2019   OT End of Session - 09/17/19 1454    Visit Number 12    Number of Visits 18    Date for OT Re-Evaluation 10/08/19    Authorization Type [employer] H[employer]    Authorization Time Period 8 visits approved (09/12/19-10/11/19) $40 copay    Authorization - Visit Number 2    Authorization - Number of Visits 8    Progress Note Due on Visit 20    OT Start Time 1115    OT Stop Time 1157    OT Time Calculation (min) 42 min    Activity Tolerance Patient tolerated treatment well    Behavior During Therapy [facility] for tasks assessed/performed           Past Medical History:  Diagnosis Date  . Adenocarcinoma of right lung ([place]) 01/06/2016  . Anginal chest pain at rest [facility])    Chronic non, controlled on Lorazepam  . Anxiety   . Arthritis    "all over"   . BPH (benign prostatic hyperplasia)   . CAD (coronary artery disease)   . Chronic lower back pain   . Complication of anesthesia    "problems making water afterwards"  . Depression   . DJD (degenerative joint disease)   . Esophageal ulcer without bleeding    bid ppi indefinitely  . Family history of adverse reaction to anesthesia    "children w/PONV"  . GERD (gastroesophageal reflux disease)   . Headache    "couple/week maybe" (09/04/2015)  . Hemochromatosis    Possible, elevated iron stores, hook-like osteophytes on hand films, normal LFTs  . Hepatitis    "yellow jaundice as a baby"  . History of ASCVD    MULTIVESSEL  . Hyperlipidemia   . Hypertension   . Osteoarthritis   . Pneumonia    "several times; got a little now" (09/04/2015)  . Rheumatoid arthritis ([place])   . Subdural  hematoma (HCC)    small after fall 09/2016-plavix held, neurosurgery consulted.  Asymptomatic.    Marland Kitchen 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[place]) 02/2018   on Lovenox lifelong since he failed PO Eliquis    Past Surgical History:  Procedure Laterality Date  . ARM SKIN LESION BIOPSY / EXCISION Left 09/02/2015  . CATARACT EXTRACTION W/ INTRAOCULAR LENS  IMPLANT, BILATERAL Bilateral   . CHOLECYSTECTOMY OPEN    . CORNEAL TRANSPLANT Bilateral    "one at [facility]; one at [facility]"  . CORONARY ANGIOPLASTY WITH STENT PLACEMENT    . CORONARY ARTERY BYPASS GRAFT  1999  . DESCENDING AORTIC ANEURYSM REPAIR W/ STENT     DE stent ostium into the right radial free graft at OM-, 02-2007  . ESOPHAGOGASTRODUODENOSCOPY N/A 11/09/2014   Procedure: ESOPHAGOGASTRODUODENOSCOPY (EGD);  Surgeon: [name], MD;  Location: [facility] ENDOSCOPY;  Service: Endoscopy;  Laterality: N/A;  . INGUINAL HERNIA REPAIR    . JOINT REPLACEMENT    . LEFT HEART CATH AND CORS/GRAFTS ANGIOGRAPHY N/A 06/27/2017   Procedure: LEFT HEART CATH AND CORS/GRAFTS ANGIOGRAPHY;  Surgeon: [name], MD;  Location: [facility];  Service: Cardiovascular;  Laterality: N/A;  . NASAL SINUS SURGERY    . SHOULDER OPEN ROTATOR CUFF REPAIR Right   . TOTAL KNEE  ARTHROPLASTY Bilateral     There were no vitals filed for this visit.   Subjective Assessment - 09/17/19 1454    Subjective  S: I think this arm is about the same.    Patient is accompanied by: Family member   daughter   Currently in Pain? No/denies              [facility] OT Assessment - 09/17/19 1108      Assessment   Medical Diagnosis Weakness      Precautions   Precautions Fall      Coordination   Right 9 Hole Peg Test Placed all pegs with set-up and removed in 10.08'   only placed 2 pegs with set-up at eval   Box and Blocks right: 9 in 1'    7 previous     Strength   Overall Strength Comments Assessed seated    Right Shoulder Flexion 4+/5   same as previous   Right Shoulder ABduction 4+/5   same as  previous   Right Shoulder Internal Rotation 4+/5   same as previous   Right Shoulder External Rotation 4+/5   same as previous   Left Shoulder Flexion 4+/5   same as previous   Left Shoulder ABduction 4+/5   same as previous   Left Shoulder Internal Rotation 4+/5   same as previous   Left Shoulder External Rotation 4+/5   same as previous   Right Hand Gross Grasp Functional    Right Hand Grip (lbs) 25   same as previous   Right Hand Lateral Pinch 10 lbs   8   Right Hand 3 Point Pinch 8 lbs   7 previous                   OT Treatments/Exercises (OP) - 09/17/19 1148      Exercises   Exercises Shoulder;Hand      Neurological Re-education Exercises   Other Exercises 2 Pt completing RUE strengthening and coordination task-holding volleyball with both hands and completing chest press, flexion, and circles each direction. Pt able to maintain hold on the ball for 5 consecutive repetitions before losing grip and requiring break to readjust hand on the ball. Pt completing 10 reps of each task.       Fine Motor Coordination (Hand/Wrist)   Fine Motor Coordination Small Pegboard    Small Pegboard Pt completing 9 hole pegboard activity today. Pt able to place and remove all pegs with set-up from therapist. Pt required increased time for task completion, cuing to prop elbow on wheelchair arm or on table for improved coordination and decreased RUE ataxia.                     OT Short Term Goals - 09/17/19 1142      OT SHORT TERM GOAL #1   Title Patient and family will be educated and verbalize independence with HEP in order to increase functional use of his right hand while using for 25% or more of daily tasks.    Time 6    Period Weeks    Status On-going    Target Date 10/08/19      OT SHORT TERM GOAL #2   Title Patient will increase scapular and proximal shoulder strengthening while demonstrating increased shoulder endurance during a sustained functional reaching task for  more than 1 minute.    Time 6    Period Weeks    Status On-going  OT SHORT TERM GOAL #3   Title Patient will increase gross motor coordination by moving 10 or more blocks during the blocks and box test allow him to complete self feeding tasks with less difficulty.    Baseline 6/15: 9 blocks    Time 6    Period Weeks    Status On-going      OT SHORT TERM GOAL #4   Title Patient will increase his right hand grip strength by 6# and his pinch strength by 3# in order to hold his utensil during meals without dropping or fatigue.    Time 6    Period Weeks    Status On-going      OT SHORT TERM GOAL #5   Title Patient will be educated on adaptive eqipment/strategies that may help improve overall motor control when completing basic self care tasks.    Time 6    Period Weeks    Status On-going      Additional Short Term Goals   Additional Short Term Goals Yes      OT SHORT TERM GOAL #6   Title Pt will be demonstrate use of RUE as an assist to increase independence in during ADL completion.    Time 3    Period Weeks    Status New                    Plan - 09/17/19 1457    Clinical Impression Statement A: Reassessment completed this date. Pt's daughter reports improvement in control of RUE and ability to use RUE as assist during daily tasks. She reports he is now able to hold things in his right hand for short times now. Pt has made progress towards goals, however has not met any goals yet. Pt continues to be limited in coordination tasks due to ataxia in RUE, however was able to complete pegtest today. Discussed progress with pt and daughter who are agreeable to continue therapy with focus on functional tasks and bilateral integration to improve ability to use RUE as an assist during ADL completion.    OT Occupational Profile and History Problem Focused Assessment - Including review of records relating to presenting problem    Occupational performance deficits (Please refer to  evaluation for details): ADL's;IADL's;Leisure    Body Structure / Function / Physical Skills ADL;UE functional use;FMC;GMC;Coordination;IADL;Dexterity;Strength    Clinical Decision Making Several treatment options, min-mod task modification necessary    Comorbidities Affecting Occupational Performance: May have comorbidities impacting occupational performance    Modification or Assistance to Complete Evaluation  Min-Moderate modification of tasks or assist with assess necessary to complete eval    OT Frequency 2x / week    OT Duration --   3 weeks   OT Treatment/Interventions Self-care/ADL training;Ultrasound;Patient/family education;DME and/or AE instruction;Paraffin;Cryotherapy;Electrical Stimulation;Moist Heat;Therapeutic exercise;Manual Therapy;Therapeutic activities;Neuromuscular education    Plan P: Continue with skilled OT services with focus on using RUE as an assist and improving independence in self-care tasks. Next session: resume metronome work and work on strategies to improve independence with ADL completion    OT Home Exercise Plan eval: stress ball for grip and pinch strengthening. 5/13: red theraband proximal shoulder strengthening.    Consulted and Agree with Plan of Care Patient;Family member/caregiver    Family Member Consulted Daughter; [name]           Patient will benefit from skilled therapeutic intervention in order to improve the following deficits and impairments:   Body Structure / Function /  Physical Skills: ADL, UE functional use, [place], GMC, Coordination, IADL, Dexterity, Strength       Visit Diagnosis: Other lack of coordination  Other symptoms and signs involving the musculoskeletal system    Problem List Patient Active Problem List   Diagnosis Date Noted  . Sundowning 07/04/2019  . Bilateral hearing loss 04/16/2019  . Urinary frequency 03/21/2019  . Chronic right shoulder pain 12/19/2018  . Weakness generalized 09/28/2018  . Palliative care  encounter 09/28/2018  . Pressure injury of skin 08/27/2018  . COPD (chronic obstructive pulmonary disease) ([place]) 08/26/2018  . AMS (altered mental status) 08/22/2018  . Altered mental status 08/21/2018  . UTI (urinary tract infection) 08/21/2018  . Abnormality of gait 08/20/2018  . Prolonged Q-T interval on ECG   . Dysphagia, post-stroke   . Supplemental oxygen dependent   . Anxiety state   . Fatigue   . SOB (shortness of breath)   . Hyperglycemia   . History of lung cancer   . Depression   . CKD (chronic kidney disease), stage III   . Acute blood loss anemia   . Fall 07/16/2018  . 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 Acute spontaneous 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 ([place]) L thalamic 07/14/2018  . Cellulitis of arm, right 06/01/2018  . Rash and nonspecific skin eruption 06/01/2018  . Chest pain 02/02/2018  . Thrombocytopenia ([place]) 02/02/2018  . Hypokalemia 02/02/2018  . Pulmonary embolism ([place]) 02/02/2018  . Elevated troponin 02/02/2018  . Pulmonary embolus ([place]) 02/02/2018  . Non-ST elevation MI (NSTEMI) ([place]) 06/27/2017  . Diastolic CHF ([place]) 85/27/7824  . Chest pain due to CAD ([place]) 06/26/2017  . Dyspnea 06/21/2017  . Acute bronchitis 05/15/2017  . Oral candidiasis 05/15/2017  . Acute pericarditis   . Chest pain at rest 12/20/2016  . Angina pectoris ([place]) 12/20/2016  . Radiation pneumonitis ([place]) 11/09/2016  . Pleurisy 10/11/2016  . Chest wall pain 10/11/2016  . Atrial fibrillation ([place]) 05/21/2016  . Adenocarcinoma of right lung ([place]) 01/06/2016  . GAD (generalized anxiety disorder) 11/20/2015  . Essential hypertension   . Cellulitis 09/04/2015  . Cellulitis of left axilla   . Esophageal ulcer without bleeding   . Bilateral lower extremity edema 04/28/2014  . BPH (benign prostatic hyperplasia) 10/08/2012  . Anxiety   . EKG abnormalities 09/05/2012  . Tremor 09/05/2012  . Chronic  fatigue 09/05/2012  . Arthritis   . Asthma, chronic   . Reflux esophagitis   . S/P CABG (coronary artery bypass graft)   . GERD (gastroesophageal reflux disease)   . Hyperlipidemia   . Hypertension   . UNSPECIFIED PERIPHERAL VASCULAR DISEASE 06/16/2009   Guadelupe Sabin, OTR/L  [phone] 09/17/2019, 3:03 PM  [place] [address] [place], [place], [address] Phone: [phone]   Fax:  [phone]  Name: TAUREN DELBUONO MRN: [id number] Date of Birth: July 13, 1927

## 2019-09-17 NOTE — Therapy (Signed)
[place], [place], [address] Phone: [phone]   Fax:  [phone]  Physical Therapy Treatment  Patient Details  Name: Evan [name] MRN: [id number] Date of Birth: [birth year] Referring Provider (PT): [name] MD    Encounter Date: 09/17/2019   PT End of Session - 09/17/19 0910    Visit Number 19    Number of Visits 20    Date for PT Re-Evaluation 09/20/19    Authorization Type [employer]    Authorization Time Period 08/23/19-09/20/19    Authorization - Visit Number 7    Authorization - Number of Visits 8    Progress Note Due on Visit 20    PT Start Time 0906    PT Stop Time 0946    PT Time Calculation (min) 40 min    Equipment Utilized During Treatment Gait belt    Activity Tolerance Patient limited by fatigue    Behavior During Therapy [facility] for tasks assessed/performed           Past Medical History:  Diagnosis Date  . Adenocarcinoma of right lung ([place]) 01/06/2016  . Anginal chest pain at rest [facility])    Chronic non, controlled on Lorazepam  . Anxiety   . Arthritis    "all over"   . BPH (benign prostatic hyperplasia)   . CAD (coronary artery disease)   . Chronic lower back pain   . Complication of anesthesia    "problems making water afterwards"  . Depression   . DJD (degenerative joint disease)   . Esophageal ulcer without bleeding    bid ppi indefinitely  . Family history of adverse reaction to anesthesia    "children w/PONV"  . GERD (gastroesophageal reflux disease)   . Headache    "couple/week maybe" (09/04/2015)  . Hemochromatosis    Possible, elevated iron stores, hook-like osteophytes on hand films, normal LFTs  . Hepatitis    "yellow jaundice as a baby"  . History of ASCVD    MULTIVESSEL  . Hyperlipidemia   . Hypertension   . Osteoarthritis   . Pneumonia    "several times; got a little now" (09/04/2015)  . Rheumatoid arthritis ([place])   . Subdural hematoma (H[facility])    small  after fall 09/2016-plavix held, neurosurgery consulted.  Asymptomatic.    Marland Kitchen 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[place]) 02/2018   on Lovenox lifelong since he failed PO Eliquis    Past Surgical History:  Procedure Laterality Date  . ARM SKIN LESION BIOPSY / EXCISION Left 09/02/2015  . CATARACT EXTRACTION W/ INTRAOCULAR LENS  IMPLANT, BILATERAL Bilateral   . CHOLECYSTECTOMY OPEN    . CORNEAL TRANSPLANT Bilateral    "one at [facility]; one at [facility]"  . CORONARY ANGIOPLASTY WITH STENT PLACEMENT    . CORONARY ARTERY BYPASS GRAFT  1999  . DESCENDING AORTIC ANEURYSM REPAIR W/ STENT     DE stent ostium into the right radial free graft at OM-, 02-2007  . ESOPHAGOGASTRODUODENOSCOPY N/A 11/09/2014   Procedure: ESOPHAGOGASTRODUODENOSCOPY (EGD);  Surgeon: [name], MD;  Location: [facility] ENDOSCOPY;  Service: Endoscopy;  Laterality: N/A;  . INGUINAL HERNIA REPAIR    . JOINT REPLACEMENT    . LEFT HEART CATH AND CORS/GRAFTS ANGIOGRAPHY N/A 06/27/2017   Procedure: LEFT HEART CATH AND CORS/GRAFTS ANGIOGRAPHY;  Surgeon: [name], MD;  Location: [facility] CV LAB;  Service: Cardiovascular;  Laterality: N/A;  . NASAL SINUS SURGERY    . SHOULDER OPEN ROTATOR CUFF REPAIR Right   .  TOTAL KNEE ARTHROPLASTY Bilateral     There were no vitals filed for this visit.   Subjective Assessment - 09/17/19 0909    Subjective Patient reports no new complaints. Says his legs are tired. His daughter says he has been walking more with boots at home, still has trouble in regular shoes due to ankle instability.    Patient is accompained by: Family member    Pertinent History CVA effecting RT side on 07/13/18, lumbar fracture December 2021    Patient Stated Goals Be able to walk    Currently in Pain? No/denies    Pain Onset More than a month ago                             [facility] Adult PT Treatment/Exercise - 09/17/19 0001      Knee/Hip Exercises: Standing   Hip Abduction Both;1 set;10 reps;Knee straight    Forward Step Up  Both;5 reps;Hand Hold: 2;Step Height: 4"    Gait Training step over cone onbstacle 1RT using RW 10 feet     Other Standing Knee Exercises Mock TUG x 3 rounds (56 sec, 50 sec, 44 sec best)     Other Standing Knee Exercises 4 inch box taps alternating x10 reps      Knee/Hip Exercises: Seated   Sit to Sand 2 sets;5 reps;with UE support   48 sec best                    PT Short Term Goals - 08/15/19 1051      PT SHORT TERM GOAL #1   Title Patient will be independent with initial HEP to improve functional outcomes    Baseline Reports compliance    Time 3    Period Weeks    Status Achieved    Target Date 08/02/19      PT SHORT TERM GOAL #2   Title Patient will be able to perform stand x 5 in < 40 seconds to demonstrate improvement in functional mobility and reduced risk for falls.    Baseline current 55 sec    Time 3    Period Weeks    Status On-going    Target Date 08/02/19             PT Long Term Goals - 08/15/19 1051      PT LONG TERM GOAL #1   Title Patient will be able to perform stand x 5 in < 25 seconds to demonstrate improvement in functional mobility and reduced risk for falls.    Baseline current 55 sec    Time 6    Period Weeks    Status On-going      PT LONG TERM GOAL #2   Title Patient will be able to perform TUG in <15 seconds, using least restrictive assistive device, to demonstrate improvement in functional mobility and reduced risk for falls.    Baseline current 65 sec with RW    Time 6    Period Weeks    Status On-going      PT LONG TERM GOAL #3   Title Patient will have equal to or > 4/5 MMT throughout BLE to improve ability to perform functional mobility, stair ambulation and ADLs.    Baseline See MMT    Time 6    Period Weeks    Status Achieved  Plan - 09/17/19 1420    Clinical Impression Statement Patient showed improvement in functional performance today. Patient with some improvement in coordinating RLE with  step taps and step ups. Practiced TUG and STS x 5 testing with patient today in preparation for reassessment next visit. Patient continues to require frequent cues for initial set up, as well as repeated verbal cues and demonstration for nearly all exercise, but was able to improve time with TUG and STS x 5 testing today with repeated trials. Patient will continue to benefit from skilled therapy services to progress LE strength and balance to improve functional mobility and reduce risk for future falls. Reassessment next visit.    Personal Factors and Comorbidities Age;Comorbidity 2    Comorbidities HX ov CVA, lumbar fracture    Examination-Activity Limitations Bathing;Bed Mobility;Bend;Squat;Stairs;Carry;Stand;Toileting;Dressing;Transfers;Hygiene/Grooming;Lift;Locomotion Level    Examination-Participation Restrictions Community Activity;Other;Cleaning    Stability/Clinical Decision Making Stable/Uncomplicated    Rehab Potential Fair    PT Frequency 2x / week    PT Duration 6 weeks    PT Treatment/Interventions ADLs/Self Care Home Management;Aquatic Therapy;Biofeedback;Cryotherapy;Electrical Stimulation;Iontophoresis 4mg /ml Dexamethasone;Moist Heat;Traction;Balance training;Manual techniques;Therapeutic exercise;Vasopneumatic Device;Wheelchair mobility training;Therapeutic activities;Splinting;Taping;Functional mobility training;Stair training;Orthotic Fit/Training;Gait training;DME Instruction;Patient/family education;Passive range of motion;Dry needling;Energy conservation;Other (comment);Joint Manipulations;Spinal Manipulations;Compression bandaging;Neuromuscular re-education;Cognitive remediation;Ultrasound;Parrafin;Fluidtherapy;Contrast Bath    PT Next Visit Plan Reassess next visit. Adjust POC as indicated.    PT Home Exercise Plan 07/10/19: seated march, LAQs, seated heel/toe raise; 07/15/19: unsupported sitting 1-2 min rounds; 08/15/19: static standing at sink or counter 09/04/19: seated windsheild  wiper, ankle eversion with RTB    Consulted and Agree with Plan of Care Patient;Family member/caregiver    Family Member Consulted Daughter           Patient will benefit from skilled therapeutic intervention in order to improve the following deficits and impairments:  Abnormal gait, Decreased endurance, Hypomobility, Impaired sensation, Decreased activity tolerance, Decreased strength, Impaired UE functional use, Decreased balance, Decreased mobility, Difficulty walking, Decreased coordination, Decreased safety awareness, Postural dysfunction  Visit Diagnosis: Other abnormalities of gait and mobility  Muscle weakness (generalized)     Problem List Patient Active Problem List   Diagnosis Date Noted  . Sundowning 07/04/2019  . Bilateral hearing loss 04/16/2019  . Urinary frequency 03/21/2019  . Chronic right shoulder pain 12/19/2018  . Weakness generalized 09/28/2018  . Palliative care encounter 09/28/2018  . Pressure injury of skin 08/27/2018  . COPD (chronic obstructive pulmonary disease) ([place]) 08/26/2018  . AMS (altered mental status) 08/22/2018  . Altered mental status 08/21/2018  . UTI (urinary tract infection) 08/21/2018  . Abnormality of gait 08/20/2018  . Prolonged Q-T interval on ECG   . Dysphagia, post-stroke   . Supplemental oxygen dependent   . Anxiety state   . Fatigue   . SOB (shortness of breath)   . Hyperglycemia   . History of lung cancer   . Depression   . CKD (chronic kidney disease), stage III   . Acute blood loss anemia   . Fall 07/16/2018  . 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 Acute spontaneous 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 ([place]) L thalamic 07/14/2018  . Cellulitis of arm, right 06/01/2018  . Rash and nonspecific skin eruption 06/01/2018  . Chest pain 02/02/2018  . Thrombocytopenia ([place]) 02/02/2018  . Hypokalemia 02/02/2018  . Pulmonary embolism ([place])  02/02/2018  . Elevated troponin 02/02/2018  . Pulmonary embolus ([place]) 02/02/2018  . Non-ST elevation MI (NSTEMI) ([place]) 06/27/2017  . Diastolic CHF ([place]) 85/46/2703  .  Chest pain due to CAD ([place]) 06/26/2017  . Dyspnea 06/21/2017  . Acute bronchitis 05/15/2017  . Oral candidiasis 05/15/2017  . Acute pericarditis   . Chest pain at rest 12/20/2016  . Angina pectoris ([place]) 12/20/2016  . Radiation pneumonitis ([place]) 11/09/2016  . Pleurisy 10/11/2016  . Chest wall pain 10/11/2016  . Atrial fibrillation ([place]) 05/21/2016  . Adenocarcinoma of right lung ([place]) 01/06/2016  . GAD (generalized anxiety disorder) 11/20/2015  . Essential hypertension   . Cellulitis 09/04/2015  . Cellulitis of left axilla   . Esophageal ulcer without bleeding   . Bilateral lower extremity edema 04/28/2014  . BPH (benign prostatic hyperplasia) 10/08/2012  . Anxiety   . EKG abnormalities 09/05/2012  . Tremor 09/05/2012  . Chronic fatigue 09/05/2012  . Arthritis   . Asthma, chronic   . Reflux esophagitis   . S/P CABG (coronary artery bypass graft)   . GERD (gastroesophageal reflux disease)   . Hyperlipidemia   . Hypertension   . UNSPECIFIED PERIPHERAL VASCULAR DISEASE 06/16/2009   2:26 PM, 09/17/19 Josue Hector PT DPT  Physical Therapist with [facility]  [phone] [place] [address] [place], [place], [address] Phone: [phone]   Fax:  [phone]  Name: DORSEY AUTHEMENT MRN: [id number] Date of Birth: 05-15-1927

## 2019-09-19 ENCOUNTER — Other Ambulatory Visit: Payer: Self-pay

## 2019-09-19 ENCOUNTER — Ambulatory Visit (HOSPITAL_COMMUNITY): Payer: Medicare HMO | Admitting: Occupational Therapy

## 2019-09-19 ENCOUNTER — Encounter (HOSPITAL_COMMUNITY): Payer: Self-pay | Admitting: Occupational Therapy

## 2019-09-19 ENCOUNTER — Ambulatory Visit (HOSPITAL_COMMUNITY): Payer: Medicare HMO | Admitting: Physical Therapy

## 2019-09-19 ENCOUNTER — Encounter (HOSPITAL_COMMUNITY): Payer: Self-pay | Admitting: Physical Therapy

## 2019-09-19 DIAGNOSIS — M6281 Muscle weakness (generalized): Secondary | ICD-10-CM | POA: Diagnosis not present

## 2019-09-19 DIAGNOSIS — R2689 Other abnormalities of gait and mobility: Secondary | ICD-10-CM

## 2019-09-19 DIAGNOSIS — R278 Other lack of coordination: Secondary | ICD-10-CM

## 2019-09-19 DIAGNOSIS — R29898 Other symptoms and signs involving the musculoskeletal system: Secondary | ICD-10-CM

## 2019-09-19 NOTE — Therapy (Signed)
[place], [place], [address] Phone: [phone]   Fax:  [phone]  Occupational Therapy Treatment  Patient Details  Name: Evan [name] MRN: [id number] Date of Birth: 1927-11-15 Referring Provider (OT): [name]   Encounter Date: 09/19/2019   OT End of Session - 09/19/19 1743    Visit Number 13    Number of Visits 18    Date for OT Re-Evaluation 10/08/19    Authorization Type [employer] H[employer]    Authorization Time Period 8 visits approved (09/12/19-10/11/19) $40 copay    Authorization - Visit Number 3    Authorization - Number of Visits 8    Progress Note Due on Visit 20    OT Start Time 1032    OT Stop Time 1116    OT Time Calculation (min) 44 min    Activity Tolerance Patient tolerated treatment well    Behavior During Therapy [facility] for tasks assessed/performed           Past Medical History:  Diagnosis Date  . Adenocarcinoma of right lung ([place]) 01/06/2016  . Anginal chest pain at rest [facility])    Chronic non, controlled on Lorazepam  . Anxiety   . Arthritis    "all over"   . BPH (benign prostatic hyperplasia)   . CAD (coronary artery disease)   . Chronic lower back pain   . Complication of anesthesia    "problems making water afterwards"  . Depression   . DJD (degenerative joint disease)   . Esophageal ulcer without bleeding    bid ppi indefinitely  . Family history of adverse reaction to anesthesia    "children w/PONV"  . GERD (gastroesophageal reflux disease)   . Headache    "couple/week maybe" (09/04/2015)  . Hemochromatosis    Possible, elevated iron stores, hook-like osteophytes on hand films, normal LFTs  . Hepatitis    "yellow jaundice as a baby"  . History of ASCVD    MULTIVESSEL  . Hyperlipidemia   . Hypertension   . Osteoarthritis   . Pneumonia    "several times; got a little now" (09/04/2015)  . Rheumatoid arthritis ([place])   . Subdural hematoma ([facility])    small after fall  09/2016-plavix held, neurosurgery consulted.  Asymptomatic.    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 ARM SKIN LESION BIOPSY / EXCISION Left 09/02/2015  . CATARACT EXTRACTION W/ INTRAOCULAR LENS  IMPLANT, BILATERAL Bilateral   . CHOLECYSTECTOMY OPEN    . CORNEAL TRANSPLANT Bilateral    "one at [facility]; one at [facility]"  . CORONARY ANGIOPLASTY WITH STENT PLACEMENT    . CORONARY ARTERY BYPASS GRAFT  1999  . DESCENDING AORTIC ANEURYSM REPAIR W/ STENT     DE stent ostium into the right radial free graft at OM-, 02-2007  . ESOPHAGOGASTRODUODENOSCOPY N/A 11/09/2014   Procedure: ESOPHAGOGASTRODUODENOSCOPY (EGD);  Surgeon: [name], MD;  Location: [facility] ENDOSCOPY;  Service: Endoscopy;  Laterality: N/A;  . INGUINAL HERNIA REPAIR    . JOINT REPLACEMENT    . LEFT HEART CATH AND CORS/GRAFTS ANGIOGRAPHY N/A 06/27/2017   Procedure: LEFT HEART CATH AND CORS/GRAFTS ANGIOGRAPHY;  Surgeon: [name], MD;  Location: [place] CV LAB;  Service: Cardiovascular;  Laterality: N/A;  . NASAL SINUS SURGERY    . SHOULDER OPEN ROTATOR CUFF REPAIR Right   . TOTAL KNEE ARTHROPLASTY Bilateral  There were no vitals filed for this visit.   Subjective Assessment - 09/19/19 1737    Subjective  S: We are going ot make strawberry ice cream for Father's Day    Patient is accompanied by: Family member   daughter   Pertinent History Patient is a46 y/o male S/P left CVA sustained 07/13/18. He received home health therapy services (OT, PT,SLP) and would like to continue with outpatient services due to his right hand weakness/uncoordination. [name] has referred patient to occupational therapy for evaluation and treatment.    Patient Stated Goals To use his right hand better.    Currently in Pain? No/denies                        OT Treatments/Exercises (OP) - 09/19/19 1738      ADLs   UB Dressing Donning  /doffing of T-shirt. 3x reps. Max cues for three step sequence (RUE, head, LUE) to don. Pt able to doff shirt with Min Guard at trail three. Presenting with increased effort and time.       Exercises   Exercises Shoulder      Shoulder Exercises: Stretch   Table Stretch - Flexion Other (comment)   10 reps   Table Stretch -Flexion Limitations Cues for palm on table. Min A for normalized movement patterns.       Neurological Re-education Exercises   Other Exercises 1 At beginning and end of session, pt participating in metronome task. 40 BPM. Alternating Rue and then LUE tapping on table for 1 minutes each rep. Min Guard A for first 3-0 sec of RUE and then Min-Mod A to assist in maintaining beat. Rest breaks between. 3 reps at beginning and 2 reps at end of session.                    OT Short Term Goals - 09/17/19 1142      OT SHORT TERM GOAL #1   Title Patient and family will be educated and verbalize independence with HEP in order to increase functional use of his right hand while using for 25% or more of daily tasks.    Time 6    Period Weeks    Status On-going    Target Date 10/08/19      OT SHORT TERM GOAL #2   Title Patient will increase scapular and proximal shoulder strengthening while demonstrating increased shoulder endurance during a sustained functional reaching task for more than 1 minute.    Time 6    Period Weeks    Status On-going      OT SHORT TERM GOAL #3   Title Patient will increase gross motor coordination by moving 10 or more blocks during the blocks and box test allow him to complete self feeding tasks with less difficulty.    Baseline 6/15: 9 blocks    Time 6    Period Weeks    Status On-going      OT SHORT TERM GOAL #4   Title Patient will increase his right hand grip strength by 6# and his pinch strength by 3# in order to hold his utensil during meals without dropping or fatigue.    Time 6    Period Weeks    Status On-going      OT SHORT  TERM GOAL #5   Title Patient will be educated on adaptive eqipment/strategies that may help improve overall motor control when completing basic self care tasks.  Time 6    Period Weeks    Status On-going      Additional Short Term Goals   Additional Short Term Goals Yes      OT SHORT TERM GOAL #6   Title Pt will be demonstrate use of RUE as an assist to increase independence in during ADL completion.    Time 3    Period Weeks    Status New                    Plan - 09/19/19 1744    Clinical Impression Statement A: Continued metronome for targetted tapping at beginning and end of session. Noting pt with increased ataxia at RUE this session. Focused on normalized movements patterns with forward flexion at shoulder and weight bearing through RUE. Pt demonstrating increased functional reach over last several session, and feel pt would benefit from further OT to optimize safety and independence with ADLs. Daughter also reporting that pt dmeonstrating progress with bADLs over last several weaks stating "I feel he is more smooth in his movement."    Body Structure / Function / Physical Skills ADL;UE functional use;FMC;GMC;Coordination;IADL;Dexterity;Strength    Plan P: Continue with metrone, functional reach, stengthing grasp and pinch at RUE, and increasing independence with ADLs.    OT Home Exercise Plan eval: stress ball for grip and pinch strengthening. 5/13: red theraband proximal shoulder strengthening.    Consulted and Agree with Plan of Care Patient;Family member/caregiver    Family Member Consulted Daughter; [name]           Patient will benefit from skilled therapeutic intervention in order to improve the following deficits and impairments:   Body Structure / Function / Physical Skills: ADL, UE functional use, FMC, GMC, Coordination, IADL, Dexterity, Strength       Visit Diagnosis: Other lack of coordination  Other symptoms and signs involving the  musculoskeletal system    Problem List Patient Active Problem List   Diagnosis Date Noted  . Sundowning 07/04/2019  . Bilateral hearing loss 04/16/2019  . Urinary frequency 03/21/2019  . Chronic right shoulder pain 12/19/2018  . Weakness generalized 09/28/2018  . Palliative care encounter 09/28/2018  . Pressure injury of skin 08/27/2018  . COPD (chronic obstructive pulmonary disease) ([place]) 08/26/2018  . AMS (altered mental status) 08/22/2018  . Altered mental status 08/21/2018  . UTI (urinary tract infection) 08/21/2018  . Abnormality of gait 08/20/2018  . Prolonged Q-T interval on ECG   . Dysphagia, post-stroke   . Supplemental oxygen dependent   . Anxiety state   . Fatigue   . SOB (shortness of breath)   . Hyperglycemia   . History of lung cancer   . Depression   . CKD (chronic kidney disease), stage III   . Acute blood loss anemia   . Fall 07/16/2018  . 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 Acute spontaneous 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 ([place]) L thalamic 07/14/2018  . Cellulitis of arm, right 06/01/2018  . Rash and nonspecific skin eruption 06/01/2018  . Chest pain 02/02/2018  . Thrombocytopenia ([place]) 02/02/2018  . Hypokalemia 02/02/2018  . Pulmonary embolism ([place]) 02/02/2018  . Elevated troponin 02/02/2018  . Pulmonary embolus ([place]) 02/02/2018  . Non-ST elevation MI (NSTEMI) ([place]) 06/27/2017  . Diastolic CHF ([place]) 72/53/6644  . Chest pain due to CAD ([place]) 06/26/2017  . Dyspnea 06/21/2017  . Acute bronchitis 05/15/2017  . Oral candidiasis 05/15/2017  . Acute pericarditis   .  Chest pain at rest 12/20/2016  . Angina pectoris ([place]) 12/20/2016  . Radiation pneumonitis ([place]) 11/09/2016  . Pleurisy 10/11/2016  . Chest wall pain 10/11/2016  . Atrial fibrillation ([place]) 05/21/2016  . Adenocarcinoma of right lung ([place]) 01/06/2016  . GAD (generalized anxiety disorder) 11/20/2015  .  Essential hypertension   . Cellulitis 09/04/2015  . Cellulitis of left axilla   . Esophageal ulcer without bleeding   . Bilateral lower extremity edema 04/28/2014  . BPH (benign prostatic hyperplasia) 10/08/2012  . Anxiety   . EKG abnormalities 09/05/2012  . Tremor 09/05/2012  . Chronic fatigue 09/05/2012  . Arthritis   . Asthma, chronic   . Reflux esophagitis   . S/P CABG (coronary artery bypass graft)   . GERD (gastroesophageal reflux disease)   . Hyperlipidemia   . Hypertension   . UNSPECIFIED PERIPHERAL VASCULAR DISEASE 06/16/2009    Neal Dy, MSOT, OTR/L 09/19/2019, 5:51 PM  [facility] [address] [place], [place], [address] Phone: [phone]   Fax:  [phone]  Name: Evan [name] MRN: [id number] Date of Birth: 05-09-1927

## 2019-09-19 NOTE — Therapy (Signed)
[facility] [address] [place], [place], [address] Phone: [phone]   Fax:  [phone]  Physical Therapy Treatment/ Progress Note  Patient Details  Name: Evan [name] MRN: [id number] Date of Birth: 23-Apr-1927 Referring Provider (PT): [name] MD    Encounter Date: 09/19/2019   Progress Note Reporting Period 08/15/19 to 09/19/19  See note below for Objective Data and Assessment of Progress/Goals.        PT End of Session - 09/19/19 0912    Visit Number 20    Number of Visits 28    Date for PT Re-Evaluation 10/18/19    Authorization Type [employer] Medicare    Authorization Time Period 08/23/19-09/20/19 (submitted for 8 more visits, check auth)    Authorization - Visit Number 8    Authorization - Number of Visits 8    Progress Note Due on Visit 28    PT Start Time 0905    PT Stop Time 0945    PT Time Calculation (min) 40 min    Equipment Utilized During Treatment Gait belt    Activity Tolerance Patient limited by fatigue    Behavior During Therapy WFL for tasks assessed/performed           Past Medical History:  Diagnosis Date  . Adenocarcinoma of right lung ([place]) 01/06/2016  . Anginal chest pain at rest [facility])    Chronic non, controlled on Lorazepam  . Anxiety   . Arthritis    "all over"   . BPH (benign prostatic hyperplasia)   . CAD (coronary artery disease)   . Chronic lower back pain   . Complication of anesthesia    "problems making water afterwards"  . Depression   . DJD (degenerative joint disease)   . Esophageal ulcer without bleeding    bid ppi indefinitely  . Family history of adverse reaction to anesthesia    "children w/PONV"  . GERD (gastroesophageal reflux disease)   . Headache    "couple/week maybe" (09/04/2015)  . Hemochromatosis    Possible, elevated iron stores, hook-like osteophytes on hand films, normal LFTs  . Hepatitis    "yellow jaundice as a baby"  . History of ASCVD    MULTIVESSEL    . Hyperlipidemia   . Hypertension   . Osteoarthritis   . Pneumonia    "several times; got a little now" (09/04/2015)  . Rheumatoid arthritis ([place])   . Subdural hematoma ([facility])    small after fall 09/2016-plavix held, neurosurgery consulted.  Asymptomatic.    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 ARM SKIN LESION BIOPSY / EXCISION Left 09/02/2015  . CATARACT EXTRACTION W/ INTRAOCULAR LENS  IMPLANT, BILATERAL Bilateral   . CHOLECYSTECTOMY OPEN    . CORNEAL TRANSPLANT Bilateral    "one at [facility]; one at [facility]"  . CORONARY ANGIOPLASTY WITH STENT PLACEMENT    . CORONARY ARTERY BYPASS GRAFT  1999  . DESCENDING AORTIC ANEURYSM REPAIR W/ STENT     DE stent ostium into the right radial free graft at OM-, 02-2007  . ESOPHAGOGASTRODUODENOSCOPY N/A 11/09/2014   Procedure: ESOPHAGOGASTRODUODENOSCOPY (EGD);  Surgeon: [name], MD;  Location: [facility] ENDOSCOPY;  Service: Endoscopy;  Laterality: N/A;  . INGUINAL HERNIA REPAIR    . JOINT REPLACEMENT    . LEFT HEART CATH AND CORS/GRAFTS ANGIOGRAPHY N/A 06/27/2017   Procedure: LEFT HEART CATH AND CORS/GRAFTS ANGIOGRAPHY;  Surgeon: [name], MD;  Location: [place] CV LAB;  Service: Cardiovascular;  Laterality: N/A;  . NASAL SINUS SURGERY    . SHOULDER OPEN ROTATOR CUFF REPAIR Right   . TOTAL KNEE ARTHROPLASTY Bilateral     There were no vitals filed for this visit.   Subjective Assessment - 09/19/19 0907    Subjective Patient says "I'm alright I reckon". His daughter says she noticed improved transfers, and smoother with walking and steady on his feet. Says she is still hesitant about him walking without WC follow.    Patient is accompained by: Family member    Pertinent History CVA effecting RT side on 07/13/18, lumbar fracture December 2021    How long can you stand comfortably? 2-3 minutes    How long can you walk comfortably? up to 80 feet in  clinic    Patient Stated Goals Be able to walk    Currently in Pain? No/denies    Pain Onset More than a month ago              [facility] PT Assessment - 09/19/19 0001      Assessment   Medical Diagnosis Weakness    Referring Provider (PT) [name] MD     Onset Date/Surgical Date 07/13/18    Prior Therapy yes. Home health OT, PT, SLP after CVA      Precautions   Precautions Fall      Restrictions   Weight Bearing Restrictions No      Balance Screen   Has the patient fallen in the past 6 months No      [place] residence    Living Arrangements Children    Available Help at Discharge Family    Type of [place] entrance    Plaza or work area in basement;Able to live on main level with bedroom/bathroom      Prior Function   Level of Independence Needs assistance with ADLs      Cognition   Overall Cognitive Status Impaired/Different from baseline      Observation/Other Assessments   Observations able to stand max of 4 minutes with gait training       Transfers   Five time sit to stand comments  38.8 sec with UEs       Ambulation/Gait   Ambulation/Gait Yes    Ambulation/Gait Assistance 6: Modified independent (Device/Increase time);5: Supervision    Ambulation Distance (Feet) 71 Feet    Assistive device Hemi-walker   on LT    Gait Pattern Step-to pattern;Decreased step length - left;Decreased stride length;Decreased dorsiflexion - right;Ataxic;Decreased step length - right;Decreased stance time - right    Ambulation Surface Level;Indoor    Gait Comments 2 standing rest breaks, improved posturing noted with hemi walker       Timed Up and Go Test   TUG Normal TUG    Normal TUG (seconds) 40   RW                                 PT Education - 09/19/19 0911    Education Details on reassessment and POC    Person(s) Educated Patient;Child(ren)    Methods Explanation     Comprehension Verbalized understanding            PT Short Term Goals - 09/19/19 0945  PT SHORT TERM GOAL #1   Title Patient will be independent with initial HEP to improve functional outcomes    Baseline Reports compliance    Time 3    Period Weeks    Status Achieved    Target Date 08/02/19      PT SHORT TERM GOAL #2   Title Patient will be able to perform stand x 5 in < 40 seconds to demonstrate improvement in functional mobility and reduced risk for falls.    Baseline current 38.8 sec    Time 3    Period Weeks    Status Achieved    Target Date 08/02/19             PT Long Term Goals - 09/19/19 0946      PT LONG TERM GOAL #1   Title Patient will be able to perform stand x 5 in < 25 seconds to demonstrate improvement in functional mobility and reduced risk for falls.    Baseline current 38.8 sec    Time 6    Period Weeks    Status On-going      PT LONG TERM GOAL #2   Title Patient will be able to perform TUG in <15 seconds, using least restrictive assistive device, to demonstrate improvement in functional mobility and reduced risk for falls.    Baseline current 40 sec with RW    Time 6    Period Weeks    Status On-going      PT LONG TERM GOAL #3   Title Patient will have equal to or > 4/5 MMT throughout BLE to improve ability to perform functional mobility, stair ambulation and ADLs.    Baseline See MMT    Time 6    Period Weeks    Status Achieved                 Plan - 09/19/19 1429    Clinical Impression Statement Patient is making slow, steady progress toward therapy goals. Patient currently with 2/2 short term and 1/3 long term goals met. Patient shows improved gait, able to stand longer, and improved sequencing with functioanal mobillity transfers. Patient has worn high ankle boots the last 3 visits which have helped significantly with his stability while he awaits fitting for ankle orthotic. Patient continues to be limited by decreased  balance, difficulty coordinating RLE, decreased activity tolerance, and conitnues to require verbal cues for transfers and gait, although much less than before. These deficits continue to negatively impact patient functional ability and place him at higher risk for future falls. Patient will continue to benefit from skilled therpay services to address these remaining deficits to improve functional mobilty and reduce risk for falls.    Personal Factors and Comorbidities Age;Comorbidity 2    Comorbidities HX ov CVA, lumbar fracture    Examination-Activity Limitations Bathing;Bed Mobility;Bend;Squat;Stairs;Carry;Stand;Toileting;Dressing;Transfers;Hygiene/Grooming;Lift;Locomotion Level    Examination-Participation Restrictions Community Activity;Other;Cleaning    Stability/Clinical Decision Making Stable/Uncomplicated    Rehab Potential Fair    PT Frequency 2x / week    PT Duration 4 weeks    PT Treatment/Interventions ADLs/Self Care Home Management;Aquatic Therapy;Biofeedback;Cryotherapy;Electrical Stimulation;Iontophoresis 59m/ml Dexamethasone;Moist Heat;Traction;Balance training;Manual techniques;Therapeutic exercise;Vasopneumatic Device;Wheelchair mobility training;Therapeutic activities;Splinting;Taping;Functional mobility training;Stair training;Orthotic Fit/Training;Gait training;DME Instruction;Patient/family education;Passive range of motion;Dry needling;Energy conservation;Other (comment);Joint Manipulations;Spinal Manipulations;Compression bandaging;Neuromuscular re-education;Cognitive remediation;Ultrasound;Parrafin;Fluidtherapy;Contrast Bath    PT Next Visit Plan Continue to progress LE strength, coordination, and balance with emphasis on gait and functional mobility/ transfers. Continue gait training with hemi walker next visit.  PT Home Exercise Plan 07/10/19: seated march, LAQs, seated heel/toe raise; 07/15/19: unsupported sitting 1-2 min rounds; 08/15/19: static standing at sink or counter  09/04/19: seated windsheild wiper, ankle eversion with RTB    Consulted and Agree with Plan of Care Patient;Family member/caregiver    Family Member Consulted Daughter           Patient will benefit from skilled therapeutic intervention in order to improve the following deficits and impairments:  Abnormal gait, Decreased endurance, Hypomobility, Impaired sensation, Decreased activity tolerance, Decreased strength, Impaired UE functional use, Decreased balance, Decreased mobility, Difficulty walking, Decreased coordination, Decreased safety awareness, Postural dysfunction  Visit Diagnosis: Other abnormalities of gait and mobility  Muscle weakness (generalized)     Problem List Patient Active Problem List   Diagnosis Date Noted  . Sundowning 07/04/2019  . Bilateral hearing loss 04/16/2019  . Urinary frequency 03/21/2019  . Chronic right shoulder pain 12/19/2018  . Weakness generalized 09/28/2018  . Palliative care encounter 09/28/2018  . Pressure injury of skin 08/27/2018  . COPD (chronic obstructive pulmonary disease) ([place]) 08/26/2018  . AMS (altered mental status) 08/22/2018  . Altered mental status 08/21/2018  . UTI (urinary tract infection) 08/21/2018  . Abnormality of gait 08/20/2018  . Prolonged Q-T interval on ECG   . Dysphagia, post-stroke   . Supplemental oxygen dependent   . Anxiety state   . Fatigue   . SOB (shortness of breath)   . Hyperglycemia   . History of lung cancer   . Depression   . CKD (chronic kidney disease), stage III   . Acute blood loss anemia   . Fall 07/16/2018  . 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 Acute spontaneous 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 ([place]) L thalamic 07/14/2018  . Cellulitis of arm, right 06/01/2018  . Rash and nonspecific skin eruption 06/01/2018  . Chest pain 02/02/2018  . Thrombocytopenia ([place]) 02/02/2018  . Hypokalemia 02/02/2018  .  Pulmonary embolism ([place]) 02/02/2018  . Elevated troponin 02/02/2018  . Pulmonary embolus ([place]) 02/02/2018  . Non-ST elevation MI (NSTEMI) ([place]) 06/27/2017  . Diastolic CHF ([place]) 02/58/5277  . Chest pain due to CAD ([place]) 06/26/2017  . Dyspnea 06/21/2017  . Acute bronchitis 05/15/2017  . Oral candidiasis 05/15/2017  . Acute pericarditis   . Chest pain at rest 12/20/2016  . Angina pectoris ([place]) 12/20/2016  . Radiation pneumonitis ([place]) 11/09/2016  . Pleurisy 10/11/2016  . Chest wall pain 10/11/2016  . Atrial fibrillation ([place]) 05/21/2016  . Adenocarcinoma of right lung ([place]) 01/06/2016  . GAD (generalized anxiety disorder) 11/20/2015  . Essential hypertension   . Cellulitis 09/04/2015  . Cellulitis of left axilla   . Esophageal ulcer without bleeding   . Bilateral lower extremity edema 04/28/2014  . BPH (benign prostatic hyperplasia) 10/08/2012  . Anxiety   . EKG abnormalities 09/05/2012  . Tremor 09/05/2012  . Chronic fatigue 09/05/2012  . Arthritis   . Asthma, chronic   . Reflux esophagitis   . S/P CABG (coronary artery bypass graft)   . GERD (gastroesophageal reflux disease)   . Hyperlipidemia   . Hypertension   . UNSPECIFIED PERIPHERAL VASCULAR DISEASE 06/16/2009   2:39 PM, 09/19/19 Josue Hector PT DPT  Physical Therapist with [facility]  [phone] [place] [address] [place], [place], [address] Phone: [phone]   Fax:  [phone]  Name: Evan [name] MRN: [id number] Date  of Birth: March 25, 1928

## 2019-09-24 ENCOUNTER — Other Ambulatory Visit: Payer: Medicare HMO | Admitting: Nurse Practitioner

## 2019-09-24 ENCOUNTER — Other Ambulatory Visit: Payer: Self-pay

## 2019-09-24 ENCOUNTER — Ambulatory Visit (HOSPITAL_COMMUNITY): Payer: Medicare HMO

## 2019-09-24 ENCOUNTER — Encounter (HOSPITAL_COMMUNITY): Payer: Self-pay

## 2019-09-24 ENCOUNTER — Telehealth: Payer: Self-pay | Admitting: Nurse Practitioner

## 2019-09-24 ENCOUNTER — Encounter (HOSPITAL_COMMUNITY): Payer: Self-pay | Admitting: Occupational Therapy

## 2019-09-24 ENCOUNTER — Ambulatory Visit (HOSPITAL_COMMUNITY): Payer: Medicare HMO | Admitting: Occupational Therapy

## 2019-09-24 DIAGNOSIS — R278 Other lack of coordination: Secondary | ICD-10-CM | POA: Diagnosis not present

## 2019-09-24 DIAGNOSIS — M6281 Muscle weakness (generalized): Secondary | ICD-10-CM

## 2019-09-24 DIAGNOSIS — R29898 Other symptoms and signs involving the musculoskeletal system: Secondary | ICD-10-CM

## 2019-09-24 DIAGNOSIS — R2689 Other abnormalities of gait and mobility: Secondary | ICD-10-CM

## 2019-09-24 NOTE — Telephone Encounter (Signed)
I called Evan [name] for Evan [name] telemedicine palliative care f/u appointment, no answer, message left to return call with contact information

## 2019-09-24 NOTE — Therapy (Addendum)
[place], [place], [address] Phone: [phone]   Fax:  [phone]  Physical Therapy Treatment  Patient Details  Name: Evan [name] MRN: [id number] Date of Birth: Aug 14, 1927 Referring Provider (PT): [name] MD    Encounter Date: 09/24/2019   PT End of Session - 09/24/19 1113    Visit Number 21    Number of Visits 28    Date for PT Re-Evaluation 10/18/19    Authorization Type [employer]    Authorization Time Period approved 8 visits form 6/17--> 10/18/19    Authorization - Visit Number 1    Authorization - Number of Visits 8    Progress Note Due on Visit 28    PT Start Time 1048    PT Stop Time 1130    PT Time Calculation (min) 42 min    Equipment Utilized During Treatment Gait belt    Activity Tolerance Patient limited by fatigue    Behavior During Therapy WFL for tasks assessed/performed           Past Medical History:  Diagnosis Date  . Adenocarcinoma of right lung ([place]) 01/06/2016  . Anginal chest pain at rest [facility])    Chronic non, controlled on Lorazepam  . Anxiety   . Arthritis    "all over"   . BPH (benign prostatic hyperplasia)   . CAD (coronary artery disease)   . Chronic lower back pain   . Complication of anesthesia    "problems making water afterwards"  . Depression   . DJD (degenerative joint disease)   . Esophageal ulcer without bleeding    bid ppi indefinitely  . Family history of adverse reaction to anesthesia    "children w/PONV"  . GERD (gastroesophageal reflux disease)   . Headache    "couple/week maybe" (09/04/2015)  . Hemochromatosis    Possible, elevated iron stores, hook-like osteophytes on hand films, normal LFTs  . Hepatitis    "yellow jaundice as a baby"  . History of ASCVD    MULTIVESSEL  . Hyperlipidemia   . Hypertension   . Osteoarthritis   . Pneumonia    "several times; got a little now" (09/04/2015)  . Rheumatoid arthritis ([place])   . Subdural  hematoma (HCC)    small after fall 09/2016-plavix held, neurosurgery consulted.  Asymptomatic.    Marland Kitchen 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[place]) 02/2018   on Lovenox lifelong since he failed PO Eliquis    Past Surgical History:  Procedure Laterality Date  . ARM SKIN LESION BIOPSY / EXCISION Left 09/02/2015  . CATARACT EXTRACTION W/ INTRAOCULAR LENS  IMPLANT, BILATERAL Bilateral   . CHOLECYSTECTOMY OPEN    . CORNEAL TRANSPLANT Bilateral    "one at [facility]; one at [facility]"  . CORONARY ANGIOPLASTY WITH STENT PLACEMENT    . CORONARY ARTERY BYPASS GRAFT  1999  . DESCENDING AORTIC ANEURYSM REPAIR W/ STENT     DE stent ostium into the right radial free graft at OM-, 02-2007  . ESOPHAGOGASTRODUODENOSCOPY N/A 11/09/2014   Procedure: ESOPHAGOGASTRODUODENOSCOPY (EGD);  Surgeon: [name], MD;  Location: [facility] ENDOSCOPY;  Service: Endoscopy;  Laterality: N/A;  . INGUINAL HERNIA REPAIR    . JOINT REPLACEMENT    . LEFT HEART CATH AND CORS/GRAFTS ANGIOGRAPHY N/A 06/27/2017   Procedure: LEFT HEART CATH AND CORS/GRAFTS ANGIOGRAPHY;  Surgeon: [name], MD;  Location: [place] CV LAB;  Service: Cardiovascular;  Laterality: N/A;  . NASAL SINUS SURGERY    . SHOULDER OPEN ROTATOR CUFF  REPAIR Right   . TOTAL KNEE ARTHROPLASTY Bilateral     There were no vitals filed for this visit.   Subjective Assessment - 09/24/19 1048    Subjective Daughter reports they are to receive the brace for Rt foot some time this week.    Pertinent History CVA effecting RT side on 07/13/18, lumbar fracture December 2021    Patient Stated Goals Be able to walk    Currently in Pain? No/denies                             [facility] Adult PT Treatment/Exercise - 09/24/19 0001      Ambulation/Gait   Ambulation/Gait Yes    Ambulation/Gait Assistance 6: Modified independent (Device/Increase time);5: Supervision    Ambulation Distance (Feet) 226 Feet    Assistive device Hemi-walker   WC behind   Gait Pattern Step-to pattern;Decreased step  length - left;Decreased stride length;Decreased dorsiflexion - right;Ataxic;Decreased step length - right;Decreased stance time - right    Ambulation Surface Level;Indoor    Gait Comments 3sets; 1 seated rest break and 1 restroom break      Knee/Hip Exercises: Standing   Forward Step Up Both;5 reps;Hand Hold: 2;Step Height: 4"    Other Standing Knee Exercises static standing lifting dowel rod over head 5x; sidestep //bars 3RT    Other Standing Knee Exercises 4 inch box taps alternating x10 reps                    PT Short Term Goals - 09/19/19 0945      PT SHORT TERM GOAL #1   Title Patient will be independent with initial HEP to improve functional outcomes    Baseline Reports compliance    Time 3    Period Weeks    Status Achieved    Target Date 08/02/19      PT SHORT TERM GOAL #2   Title Patient will be able to perform stand x 5 in < 40 seconds to demonstrate improvement in functional mobility and reduced risk for falls.    Baseline current 38.8 sec    Time 3    Period Weeks    Status Achieved    Target Date 08/02/19             PT Long Term Goals - 09/19/19 0946      PT LONG TERM GOAL #1   Title Patient will be able to perform stand x 5 in < 25 seconds to demonstrate improvement in functional mobility and reduced risk for falls.    Baseline current 38.8 sec    Time 6    Period Weeks    Status On-going      PT LONG TERM GOAL #2   Title Patient will be able to perform TUG in <15 seconds, using least restrictive assistive device, to demonstrate improvement in functional mobility and reduced risk for falls.    Baseline current 40 sec with RW    Time 6    Period Weeks    Status On-going      PT LONG TERM GOAL #3   Title Patient will have equal to or > 4/5 MMT throughout BLE to improve ability to perform functional mobility, stair ambulation and ADLs.    Baseline See MMT    Time 6    Period Weeks    Status Achieved  Plan -  09/24/19 1153    Clinical Impression Statement Pt making vast improvements with foot placement during gait by wearing high ankle boots, daughter reports brace should be ready this week.  Gait training complete with hemiwalker and wheelchair behind for safety.  Pt continues to fatigue quickly required seated or standing rest breaks.  Added static balance activities with UE movements that required min-mod assistance to reduce posterior lean.  Continued LE strengthening exercises with improved activity tolerance noted, ability to complete 5 reps prior need for rest breaks.    Personal Factors and Comorbidities Age;Comorbidity 2    Comorbidities HX ov CVA, lumbar fracture    Examination-Activity Limitations Bathing;Bed Mobility;Bend;Squat;Stairs;Carry;Stand;Toileting;Dressing;Transfers;Hygiene/Grooming;Lift;Locomotion Level    Examination-Participation Restrictions Community Activity;Other;Cleaning    Stability/Clinical Decision Making Stable/Uncomplicated    Clinical Decision Making Low    Rehab Potential Fair    PT Frequency 2x / week    PT Duration 4 weeks    PT Treatment/Interventions ADLs/Self Care Home Management;Aquatic Therapy;Biofeedback;Cryotherapy;Electrical Stimulation;Iontophoresis 4mg /ml Dexamethasone;Moist Heat;Traction;Balance training;Manual techniques;Therapeutic exercise;Vasopneumatic Device;Wheelchair mobility training;Therapeutic activities;Splinting;Taping;Functional mobility training;Stair training;Orthotic Fit/Training;Gait training;DME Instruction;Patient/family education;Passive range of motion;Dry needling;Energy conservation;Other (comment);Joint Manipulations;Spinal Manipulations;Compression bandaging;Neuromuscular re-education;Cognitive remediation;Ultrasound;Parrafin;Fluidtherapy;Contrast Bath    PT Next Visit Plan Continue to progress LE strength, coordination, and balance with emphasis on gait and functional mobility/ transfers. Continue gait training with hemi walker next  visit.    PT Home Exercise Plan 07/10/19: seated march, LAQs, seated heel/toe raise; 07/15/19: unsupported sitting 1-2 min rounds; 08/15/19: static standing at sink or counter 09/04/19: seated windsheild wiper, ankle eversion with RTB    Consulted and Agree with Plan of Care Patient;Family member/caregiver    Family Member Consulted Daughter           Patient will benefit from skilled therapeutic intervention in order to improve the following deficits and impairments:  Abnormal gait, Decreased endurance, Hypomobility, Impaired sensation, Decreased activity tolerance, Decreased strength, Impaired UE functional use, Decreased balance, Decreased mobility, Difficulty walking, Decreased coordination, Decreased safety awareness, Postural dysfunction  Visit Diagnosis: Other abnormalities of gait and mobility  Muscle weakness (generalized)     Problem List Patient Active Problem List   Diagnosis Date Noted  . Sundowning 07/04/2019  . Bilateral hearing loss 04/16/2019  . Urinary frequency 03/21/2019  . Chronic right shoulder pain 12/19/2018  . Weakness generalized 09/28/2018  . Palliative care encounter 09/28/2018  . Pressure injury of skin 08/27/2018  . COPD (chronic obstructive pulmonary disease) ([place]) 08/26/2018  . AMS (altered mental status) 08/22/2018  . Altered mental status 08/21/2018  . UTI (urinary tract infection) 08/21/2018  . Abnormality of gait 08/20/2018  . Prolonged Q-T interval on ECG   . Dysphagia, post-stroke   . Supplemental oxygen dependent   . Anxiety state   . Fatigue   . SOB (shortness of breath)   . Hyperglycemia   . History of lung cancer   . Depression   . CKD (chronic kidney disease), stage III   . Acute blood loss anemia   . Fall 07/16/2018  . 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 Acute spontaneous 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 ([place]) L thalamic 07/14/2018  . Cellulitis of  arm, right 06/01/2018  . Rash and nonspecific skin eruption 06/01/2018  . Chest pain 02/02/2018  . Thrombocytopenia ([place]) 02/02/2018  . Hypokalemia 02/02/2018  . Pulmonary embolism ([place]) 02/02/2018  . Elevated troponin 02/02/2018  . Pulmonary embolus ([place]) 02/02/2018  . Non-ST elevation MI (NSTEMI) ([place]) 06/27/2017  . Diastolic CHF (  [place]) 06/26/2017  . Chest pain due to CAD ([place]) 06/26/2017  . Dyspnea 06/21/2017  . Acute bronchitis 05/15/2017  . Oral candidiasis 05/15/2017  . Acute pericarditis   . Chest pain at rest 12/20/2016  . Angina pectoris ([place]) 12/20/2016  . Radiation pneumonitis ([place]) 11/09/2016  . Pleurisy 10/11/2016  . Chest wall pain 10/11/2016  . Atrial fibrillation ([place]) 05/21/2016  . Adenocarcinoma of right lung ([place]) 01/06/2016  . GAD (generalized anxiety disorder) 11/20/2015  . Essential hypertension   . Cellulitis 09/04/2015  . Cellulitis of left axilla   . Esophageal ulcer without bleeding   . Bilateral lower extremity edema 04/28/2014  . BPH (benign prostatic hyperplasia) 10/08/2012  . Anxiety   . EKG abnormalities 09/05/2012  . Tremor 09/05/2012  . Chronic fatigue 09/05/2012  . Arthritis   . Asthma, chronic   . Reflux esophagitis   . S/P CABG (coronary artery bypass graft)   . GERD (gastroesophageal reflux disease)   . Hyperlipidemia   . Hypertension   . UNSPECIFIED PERIPHERAL VASCULAR DISEASE 06/16/2009   Ihor Austin, LPTA/CLT; CBIS [phone]  Aldona Lento 09/24/2019, 12:09 PM  [place], [place], [address] Phone: [phone]   Fax:  [phone]  Name: Evan [name] MRN: [id number] Date of Birth: Aug 10, 1927

## 2019-09-24 NOTE — Therapy (Signed)
[place], [place], [address] Phone: [phone]   Fax:  [phone]  Occupational Therapy Treatment  Patient Details  Name: Evan [name] MRN: [id number] Date of Birth: 1928/01/10 Referring Provider (OT): [name]   Encounter Date: 09/24/2019   OT End of Session - 09/24/19 1249    Visit Number 14    Number of Visits 18    Date for OT Re-Evaluation 10/08/19    Authorization Type [employer] HMO    Authorization Time Period 8 visits approved (09/12/19-10/11/19) $40 copay    Authorization - Visit Number 4    Authorization - Number of Visits 8    Progress Note Due on Visit 20    OT Start Time 0916    OT Stop Time 0958    OT Time Calculation (min) 42 min    Activity Tolerance Patient tolerated treatment well    Behavior During Therapy [facility] for tasks assessed/performed           Past Medical History:  Diagnosis Date  . Adenocarcinoma of right lung ([place]) 01/06/2016  . Anginal chest pain at rest [facility])    Chronic non, controlled on Lorazepam  . Anxiety   . Arthritis    "all over"   . BPH (benign prostatic hyperplasia)   . CAD (coronary artery disease)   . Chronic lower back pain   . Complication of anesthesia    "problems making water afterwards"  . Depression   . DJD (degenerative joint disease)   . Esophageal ulcer without bleeding    bid ppi indefinitely  . Family history of adverse reaction to anesthesia    "children w/PONV"  . GERD (gastroesophageal reflux disease)   . Headache    "couple/week maybe" (09/04/2015)  . Hemochromatosis    Possible, elevated iron stores, hook-like osteophytes on hand films, normal LFTs  . Hepatitis    "yellow jaundice as a baby"  . History of ASCVD    MULTIVESSEL  . Hyperlipidemia   . Hypertension   . Osteoarthritis   . Pneumonia    "several times; got a little now" (09/04/2015)  . Rheumatoid arthritis ([place])   . Subdural hematoma (HCC)    small after fall  09/2016-plavix held, neurosurgery consulted.  Asymptomatic.    Marland Kitchen 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[place]) 02/2018   on Lovenox lifelong since he failed PO Eliquis    Past Surgical History:  Procedure Laterality Date  . ARM SKIN LESION BIOPSY / EXCISION Left 09/02/2015  . CATARACT EXTRACTION W/ INTRAOCULAR LENS  IMPLANT, BILATERAL Bilateral   . CHOLECYSTECTOMY OPEN    . CORNEAL TRANSPLANT Bilateral    "one at [facility]; one at [facility]"  . CORONARY ANGIOPLASTY WITH STENT PLACEMENT    . CORONARY ARTERY BYPASS GRAFT  1999  . DESCENDING AORTIC ANEURYSM REPAIR W/ STENT     DE stent ostium into the right radial free graft at OM-, 02-2007  . ESOPHAGOGASTRODUODENOSCOPY N/A 11/09/2014   Procedure: ESOPHAGOGASTRODUODENOSCOPY (EGD);  Surgeon: [name], MD;  Location: [facility] ENDOSCOPY;  Service: Endoscopy;  Laterality: N/A;  . INGUINAL HERNIA REPAIR    . JOINT REPLACEMENT    . LEFT HEART CATH AND CORS/GRAFTS ANGIOGRAPHY N/A 06/27/2017   Procedure: LEFT HEART CATH AND CORS/GRAFTS ANGIOGRAPHY;  Surgeon: [name], MD;  Location: [place] CV LAB;  Service: Cardiovascular;  Laterality: N/A;  . NASAL SINUS SURGERY    . SHOULDER OPEN ROTATOR CUFF REPAIR Right   . TOTAL KNEE ARTHROPLASTY Bilateral  There were no vitals filed for this visit.   Subjective Assessment - 09/24/19 0911    Subjective  S: "I've been trying to get dressed at home."    Patient is accompanied by: Family member   Daughter   Pertinent History Patient is a26 y/o male S/P left CVA sustained 07/13/18. He received home health therapy services (OT, PT,SLP) and would like to continue with outpatient services due to his right hand weakness/uncoordination. [name] has referred patient to occupational therapy for evaluation and treatment.    Currently in Pain? No/denies                        OT Treatments/Exercises (OP) - 09/24/19 1005      ADLs   UB Dressing Donning /doffing of T-shirt. 3x reps. Max cues for three step sequence (RUE,  head, LUE) to don. Pt able to doff shirt without physical A. Presenting with increased effort and time. Discussed with daughter to continue practice sequence of donning t-shirt at home.       Shoulder Exercises: Stretch   Table Stretch - Flexion Other (comment)   10 reps   Table Stretch -Flexion Limitations Cues for palm on table. Min A for normalized movement patterns.       Neurological Re-education Exercises   Other Exercises 1 At beginning and end of session, pt participating in metronome task. 40 BPM. Alternating RUE and then LUE tapping on table for 1 minutes each rep.  Pt performing task at supervision level with visual cues (therapist tapping) for RUE; requiring Min A at third rep in beginning of session. Rest breaks between. 3 reps at beginning and 2 reps at end of session.    Other Grasp and Release Exercises  Ball peg task. Focused on breaking reach pattern down into steps of open hand, reach, and squeeze ball. Pt then placing ball peg into an associated peg hole. Performing task twice with Supervision. Transitioning task to increase reach distance. Pt reaching for ball peg and then crossing midline to place at seat lateral to him. VCs throughout for positioning, control, and breaks.                    OT Short Term Goals - 09/17/19 1142      OT SHORT TERM GOAL #1   Title Patient and family will be educated and verbalize independence with HEP in order to increase functional use of his right hand while using for 25% or more of daily tasks.    Time 6    Period Weeks    Status On-going    Target Date 10/08/19      OT SHORT TERM GOAL #2   Title Patient will increase scapular and proximal shoulder strengthening while demonstrating increased shoulder endurance during a sustained functional reaching task for more than 1 minute.    Time 6    Period Weeks    Status On-going      OT SHORT TERM GOAL #3   Title Patient will increase gross motor coordination by moving 10 or more  blocks during the blocks and box test allow him to complete self feeding tasks with less difficulty.    Baseline 6/15: 9 blocks    Time 6    Period Weeks    Status On-going      OT SHORT TERM GOAL #4   Title Patient will increase his right hand grip strength by 6# and his pinch strength by  3# in order to hold his utensil during meals without dropping or fatigue.    Time 6    Period Weeks    Status On-going      OT SHORT TERM GOAL #5   Title Patient will be educated on adaptive eqipment/strategies that may help improve overall motor control when completing basic self care tasks.    Time 6    Period Weeks    Status On-going      Additional Short Term Goals   Additional Short Term Goals Yes      OT SHORT TERM GOAL #6   Title Pt will be demonstrate use of RUE as an assist to increase independence in during ADL completion.    Time 3    Period Weeks    Status New                    Plan - 09/24/19 1250    Clinical Impression Statement A: Initiated and ended session with metronome activity to continue challenging targeted reach.  Pt demonstrating increased coordination and motor planning as seen by performing task with Min Guard-Min A (vs Mod-Max A as during prior session) for RUE tapping and control. Pt also demonstrating increased activity tolerance performing tasks for 1 minutes x3. Continued functional reach for ball ped task; pt continued to demonstrating increased motor planning performing task at supervision level without hand over hand. Pt performing UB dressing to don t-shirt with Supervision; Max cues for sequencing to compensate for decreased ROM and coordination of RUE. Providing cues for form and technique throughout.    Body Structure / Function / Physical Skills ADL;UE functional use;FMC;GMC;Coordination;IADL;Dexterity;Strength    Plan P: Continue with metrone, functional reach, stengthing grasp and pinch at RUE, and increasing independence with ADLs. Try metronome  activity with BUEs.    OT Home Exercise Plan eval: stress ball for grip and pinch strengthening. 5/13: red theraband proximal shoulder strengthening.    Consulted and Agree with Plan of Care Patient;Family member/caregiver    Family Member Consulted Daughter; [name]           Patient will benefit from skilled therapeutic intervention in order to improve the following deficits and impairments:   Body Structure / Function / Physical Skills: ADL, UE functional use, FMC, GMC, Coordination, IADL, Dexterity, Strength       Visit Diagnosis: Other lack of coordination  Other symptoms and signs involving the musculoskeletal system    Problem List Patient Active Problem List   Diagnosis Date Noted  . Sundowning 07/04/2019  . Bilateral hearing loss 04/16/2019  . Urinary frequency 03/21/2019  . Chronic right shoulder pain 12/19/2018  . Weakness generalized 09/28/2018  . Palliative care encounter 09/28/2018  . Pressure injury of skin 08/27/2018  . COPD (chronic obstructive pulmonary disease) ([place]) 08/26/2018  . AMS (altered mental status) 08/22/2018  . Altered mental status 08/21/2018  . UTI (urinary tract infection) 08/21/2018  . Abnormality of gait 08/20/2018  . Prolonged Q-T interval on ECG   . Dysphagia, post-stroke   . Supplemental oxygen dependent   . Anxiety state   . Fatigue   . SOB (shortness of breath)   . Hyperglycemia   . History of lung cancer   . Depression   . CKD (chronic kidney disease), stage III   . Acute blood loss anemia   . Fall 07/16/2018  . 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 Acute spontaneous intraparenchymal intracranial hemorrhage associated with  coagulopathy ([place]) L thalamic 07/14/2018  . Cellulitis of arm, right 06/01/2018  . Rash and nonspecific skin eruption 06/01/2018  . Chest pain 02/02/2018  . Thrombocytopenia ([place]) 02/02/2018  . Hypokalemia 02/02/2018  . Pulmonary embolism ([place]) 02/02/2018    . Elevated troponin 02/02/2018  . Pulmonary embolus ([place]) 02/02/2018  . Non-ST elevation MI (NSTEMI) ([place]) 06/27/2017  . Diastolic CHF ([place]) 10/93/2355  . Chest pain due to CAD ([place]) 06/26/2017  . Dyspnea 06/21/2017  . Acute bronchitis 05/15/2017  . Oral candidiasis 05/15/2017  . Acute pericarditis   . Chest pain at rest 12/20/2016  . Angina pectoris ([place]) 12/20/2016  . Radiation pneumonitis ([place]) 11/09/2016  . Pleurisy 10/11/2016  . Chest wall pain 10/11/2016  . Atrial fibrillation ([place]) 05/21/2016  . Adenocarcinoma of right lung ([place]) 01/06/2016  . GAD (generalized anxiety disorder) 11/20/2015  . Essential hypertension   . Cellulitis 09/04/2015  . Cellulitis of left axilla   . Esophageal ulcer without bleeding   . Bilateral lower extremity edema 04/28/2014  . BPH (benign prostatic hyperplasia) 10/08/2012  . Anxiety   . EKG abnormalities 09/05/2012  . Tremor 09/05/2012  . Chronic fatigue 09/05/2012  . Arthritis   . Asthma, chronic   . Reflux esophagitis   . S/P CABG (coronary artery bypass graft)   . GERD (gastroesophageal reflux disease)   . Hyperlipidemia   . Hypertension   . UNSPECIFIED PERIPHERAL VASCULAR DISEASE 06/16/2009    Neal Dy, MSOT, OTR/L 09/24/2019, 1:02 PM  [place] [address] [place], [place], [address] Phone: [phone]   Fax:  [phone]  Name: Evan [name] MRN: [id number] Date of Birth: 03/08/1928

## 2019-09-25 DIAGNOSIS — R351 Nocturia: Secondary | ICD-10-CM | POA: Diagnosis not present

## 2019-09-25 DIAGNOSIS — R3915 Urgency of urination: Secondary | ICD-10-CM | POA: Diagnosis not present

## 2019-09-25 DIAGNOSIS — N403 Nodular prostate with lower urinary tract symptoms: Secondary | ICD-10-CM | POA: Diagnosis not present

## 2019-09-26 ENCOUNTER — Ambulatory Visit (HOSPITAL_COMMUNITY): Payer: Medicare HMO

## 2019-09-26 ENCOUNTER — Encounter (HOSPITAL_COMMUNITY): Payer: Self-pay

## 2019-09-26 ENCOUNTER — Telehealth: Payer: Self-pay | Admitting: Nurse Practitioner

## 2019-09-26 ENCOUNTER — Ambulatory Visit (HOSPITAL_COMMUNITY): Payer: Medicare HMO | Admitting: Occupational Therapy

## 2019-09-26 ENCOUNTER — Other Ambulatory Visit: Payer: Self-pay

## 2019-09-26 ENCOUNTER — Encounter (HOSPITAL_COMMUNITY): Payer: Self-pay | Admitting: Occupational Therapy

## 2019-09-26 DIAGNOSIS — R278 Other lack of coordination: Secondary | ICD-10-CM | POA: Diagnosis not present

## 2019-09-26 DIAGNOSIS — R2689 Other abnormalities of gait and mobility: Secondary | ICD-10-CM

## 2019-09-26 DIAGNOSIS — R29898 Other symptoms and signs involving the musculoskeletal system: Secondary | ICD-10-CM | POA: Diagnosis not present

## 2019-09-26 DIAGNOSIS — M6281 Muscle weakness (generalized): Secondary | ICD-10-CM

## 2019-09-26 NOTE — Therapy (Signed)
[place], [place], [address] Phone: [phone]   Fax:  [phone]  Occupational Therapy Treatment  Patient Details  Name: Evan [name] MRN: [id number] Date of Birth: 22-Oct-1927 Referring Provider (OT): [name]   Encounter Date: 09/26/2019   OT End of Session - 09/26/19 1710    Visit Number 15    Number of Visits 18    Date for OT Re-Evaluation 10/08/19    Authorization Type [employer] H[employer]    Authorization Time Period 8 visits approved (09/12/19-10/11/19) $40 copay    Authorization - Visit Number 5    Authorization - Number of Visits 8    Progress Note Due on Visit 20    OT Start Time 0946    OT Stop Time 1029    OT Time Calculation (min) 43 min    Activity Tolerance Patient tolerated treatment well    Behavior During Therapy [facility] for tasks assessed/performed           Past Medical History:  Diagnosis Date  . Adenocarcinoma of right lung ([place]) 01/06/2016  . Anginal chest pain at rest [facility])    Chronic non, controlled on Lorazepam  . Anxiety   . Arthritis    "all over"   . BPH (benign prostatic hyperplasia)   . CAD (coronary artery disease)   . Chronic lower back pain   . Complication of anesthesia    "problems making water afterwards"  . Depression   . DJD (degenerative joint disease)   . Esophageal ulcer without bleeding    bid ppi indefinitely  . Family history of adverse reaction to anesthesia    "children w/PONV"  . GERD (gastroesophageal reflux disease)   . Headache    "couple/week maybe" (09/04/2015)  . Hemochromatosis    Possible, elevated iron stores, hook-like osteophytes on hand films, normal LFTs  . Hepatitis    "yellow jaundice as a baby"  . History of ASCVD    MULTIVESSEL  . Hyperlipidemia   . Hypertension   . Osteoarthritis   . Pneumonia    "several times; got a little now" (09/04/2015)  . Rheumatoid arthritis ([place])   . Subdural hematoma (H[facility])    small after fall  09/2016-plavix held, neurosurgery consulted.  Asymptomatic.    Marland Kitchen 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[place]) 02/2018   on Lovenox lifelong since he failed PO Eliquis    Past Surgical History:  Procedure Laterality Date  . ARM SKIN LESION BIOPSY / EXCISION Left 09/02/2015  . CATARACT EXTRACTION W/ INTRAOCULAR LENS  IMPLANT, BILATERAL Bilateral   . CHOLECYSTECTOMY OPEN    . CORNEAL TRANSPLANT Bilateral    "one at [facility]; one at [facility]"  . CORONARY ANGIOPLASTY WITH STENT PLACEMENT    . CORONARY ARTERY BYPASS GRAFT  1999  . DESCENDING AORTIC ANEURYSM REPAIR W/ STENT     DE stent ostium into the right radial free graft at OM-, 02-2007  . ESOPHAGOGASTRODUODENOSCOPY N/A 11/09/2014   Procedure: ESOPHAGOGASTRODUODENOSCOPY (EGD);  Surgeon: [name], MD;  Location: [facility] ENDOSCOPY;  Service: Endoscopy;  Laterality: N/A;  . INGUINAL HERNIA REPAIR    . JOINT REPLACEMENT    . LEFT HEART CATH AND CORS/GRAFTS ANGIOGRAPHY N/A 06/27/2017   Procedure: LEFT HEART CATH AND CORS/GRAFTS ANGIOGRAPHY;  Surgeon: [name], MD;  Location: [place] CV LAB;  Service: Cardiovascular;  Laterality: N/A;  . NASAL SINUS SURGERY    . SHOULDER OPEN ROTATOR CUFF REPAIR Right   . TOTAL KNEE ARTHROPLASTY Bilateral  There were no vitals filed for this visit.   Subjective Assessment - 09/26/19 1707    Subjective  S: "I can't control this arm."    Currently in Pain? No/denies                        OT Treatments/Exercises (OP) - 09/26/19 1039      Transfers   Transfers Sit to Stand    Sit to Stand 4: Min guard    Sit to Stand Details (indicate cue type and reason) verbal cues to lean forward for posterior lean    Comments x3 verbal cues for correcting posture with posterior lean and foot placement      ADLs   UB Dressing Education on donning and doffing shirt x3 with compensatory technique to thread affected arm, head, and then unaffected arm instead of pulling shirt over head to don shirt. VC for sequencing.       Exercises   Exercises Shoulder      Shoulder Exercises: Stretch   Table Stretch - Flexion 5 reps    Table Stretch -Flexion Limitations Cues for palm on table. Min A for normalized movement patterns.       Neurological Re-education Exercises   Other Exercises 1 At beginning and end of session, pt participating in metronome task. 40 BPM. Alternating RUE and then LUE tapping on table for 1 minutes each rep.  Pt performing task at supervision level with visual cues (therapist tapping) for RUE; requiring Min A at third rep in beginning of session. Pt completed one rep for 53s with BUE with supervision.    Other Grasp and Release Exercises  seated at table with reaching for theraputty ball with crossing midline and focusing on not grasping until object is in palm with min verbal cues    Weight Bearing Position Standing    Standing with weight shifting on and off Flattening yellow theraputty in standing with weight bearing through RUE    Theraputty - Flatten In standing, having pt weight bear through RUE and flatten yellow putty. 5 reps in standing. 3 sets with seated rest breaks between      Functional Reaching Activities   Mid Level --      Weight Bearing Technique   Weight Bearing Technique Yes   weightbearing through RUE during theraputty flattening                   OT Short Term Goals - 09/17/19 1142      OT SHORT TERM GOAL #1   Title Patient and family will be educated and verbalize independence with HEP in order to increase functional use of his right hand while using for 25% or more of daily tasks.    Time 6    Period Weeks    Status On-going    Target Date 10/08/19      OT SHORT TERM GOAL #2   Title Patient will increase scapular and proximal shoulder strengthening while demonstrating increased shoulder endurance during a sustained functional reaching task for more than 1 minute.    Time 6    Period Weeks    Status On-going      OT SHORT TERM GOAL #3   Title Patient  will increase gross motor coordination by moving 10 or more blocks during the blocks and box test allow him to complete self feeding tasks with less difficulty.    Baseline 6/15: 9 blocks    Time 6  Period Weeks    Status On-going      OT SHORT TERM GOAL #4   Title Patient will increase his right hand grip strength by 6# and his pinch strength by 3# in order to hold his utensil during meals without dropping or fatigue.    Time 6    Period Weeks    Status On-going      OT SHORT TERM GOAL #5   Title Patient will be educated on adaptive eqipment/strategies that may help improve overall motor control when completing basic self care tasks.    Time 6    Period Weeks    Status On-going      Additional Short Term Goals   Additional Short Term Goals Yes      OT SHORT TERM GOAL #6   Title Pt will be demonstrate use of RUE as an assist to increase independence in during ADL completion.    Time 3    Period Weeks    Status New                    Plan - 09/26/19 1309    Clinical Impression Statement A: Initiated and ended session with metronome activity to continue challenging target reach. Pt demonstrated increased coordination and motor planning with Supervision-Min Guard (vs Min Guard- Min A as during prior session) Pt's coordination increased after weightbearing activity. Pt also demonstrated increased activity tolerance performing tasks for 1 minute x4. Paticipated in sit<>stand x3 with Min guard-Min A with verbal cues for posture to correct posterior lean and foot position. Pt completed weightbearing in stand while flattening putty ball. Continued functional reach for ball task; pt continued to demonstrate increased motor planning performing task with supervision x3. Pt performing UB dressing to don shirt with supervision and Max verbal cues for sequencing to compensate for decreased ROM and coordination in RUE.    OT Occupational Profile and History Problem Focused Assessment -  Including review of records relating to presenting problem    Occupational performance deficits (Please refer to evaluation for details): ADL's;IADL's;Leisure    Body Structure / Function / Physical Skills ADL;UE functional use;FMC;GMC;Coordination;IADL;Dexterity;Strength    Rehab Potential Good    Clinical Decision Making Several treatment options, min-mod task modification necessary    Comorbidities Affecting Occupational Performance: May have comorbidities impacting occupational performance    Plan P: Continue with metrone, functional reach, stengthing grasp and pinch at RUE, and increasing independence with ADLs. Continue metronome activity with BUEs.    OT Home Exercise Plan eval: stress ball for grip and pinch strengthening. 5/13: red theraband proximal shoulder strengthening.    Consulted and Agree with Plan of Care Patient;Family member/caregiver    Family Member Consulted Daughter; [name]           Patient will benefit from skilled therapeutic intervention in order to improve the following deficits and impairments:   Body Structure / Function / Physical Skills: ADL, UE functional use, FMC, GMC, Coordination, IADL, Dexterity, Strength       Visit Diagnosis: Other lack of coordination  Other symptoms and signs involving the musculoskeletal system    Problem List Patient Active Problem List   Diagnosis Date Noted  . Sundowning 07/04/2019  . Bilateral hearing loss 04/16/2019  . Urinary frequency 03/21/2019  . Chronic right shoulder pain 12/19/2018  . Weakness generalized 09/28/2018  . Palliative care encounter 09/28/2018  . Pressure injury of skin 08/27/2018  . COPD (chronic obstructive pulmonary disease) ([place]) 08/26/2018  .  AMS (altered mental status) 08/22/2018  . Altered mental status 08/21/2018  . UTI (urinary tract infection) 08/21/2018  . Abnormality of gait 08/20/2018  . Prolonged Q-T interval on ECG   . Dysphagia, post-stroke   . Supplemental oxygen  dependent   . Anxiety state   . Fatigue   . SOB (shortness of breath)   . Hyperglycemia   . History of lung cancer   . Depression   . CKD (chronic kidney disease), stage III   . Acute blood loss anemia   . Fall 07/16/2018  . 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 Acute spontaneous 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 ([place]) L thalamic 07/14/2018  . Cellulitis of arm, right 06/01/2018  . Rash and nonspecific skin eruption 06/01/2018  . Chest pain 02/02/2018  . Thrombocytopenia ([place]) 02/02/2018  . Hypokalemia 02/02/2018  . Pulmonary embolism ([place]) 02/02/2018  . Elevated troponin 02/02/2018  . Pulmonary embolus ([place]) 02/02/2018  . Non-ST elevation MI (NSTEMI) ([place]) 06/27/2017  . Diastolic CHF ([place]) 75/30/0511  . Chest pain due to CAD ([place]) 06/26/2017  . Dyspnea 06/21/2017  . Acute bronchitis 05/15/2017  . Oral candidiasis 05/15/2017  . Acute pericarditis   . Chest pain at rest 12/20/2016  . Angina pectoris ([place]) 12/20/2016  . Radiation pneumonitis ([place]) 11/09/2016  . Pleurisy 10/11/2016  . Chest wall pain 10/11/2016  . Atrial fibrillation ([place]) 05/21/2016  . Adenocarcinoma of right lung ([place]) 01/06/2016  . GAD (generalized anxiety disorder) 11/20/2015  . Essential hypertension   . Cellulitis 09/04/2015  . Cellulitis of left axilla   . Esophageal ulcer without bleeding   . Bilateral lower extremity edema 04/28/2014  . BPH (benign prostatic hyperplasia) 10/08/2012  . Anxiety   . EKG abnormalities 09/05/2012  . Tremor 09/05/2012  . Chronic fatigue 09/05/2012  . Arthritis   . Asthma, chronic   . Reflux esophagitis   . S/P CABG (coronary artery bypass graft)   . GERD (gastroesophageal reflux disease)   . Hyperlipidemia   . Hypertension   . UNSPECIFIED PERIPHERAL VASCULAR DISEASE 06/16/2009    Evan [name], MSOT, OTR/L 09/26/2019, 5:14 PM  [place] [address] [place], [place], [address] Phone: [phone]   Fax:  [phone]  Name: Evan [name] MRN: [id number] Date of Birth: Jul 10, 1927

## 2019-09-26 NOTE — Telephone Encounter (Signed)
I called Evan [name], Mr. Rosencrans daughter to reschedule PC f/u visit, Evan [name] endorses he is doing well and would like to d/c from [facility] at this time, will recontact if needed in the future.

## 2019-09-26 NOTE — Therapy (Signed)
[place], [place], [address] Phone: [phone]   Fax:  [phone]  Physical Therapy Treatment  Patient Details  Name: Evan [name] MRN: [id number] Date of Birth: 1927-09-29 Referring Provider (PT): [name] MD    Encounter Date: 09/26/2019   PT End of Session - 09/26/19 1112    Visit Number 22    Number of Visits 28    Date for PT Re-Evaluation 10/18/19    Authorization Type [employer] Medicare    Authorization Time Period approved 8 visits form 6/17--> 10/18/19    Authorization - Visit Number 2    Authorization - Number of Visits 8    Progress Note Due on Visit 28    PT Start Time 6962    PT Stop Time 1125    PT Time Calculation (min) 45 min    Equipment Utilized During Treatment Gait belt    Activity Tolerance Patient limited by fatigue    Behavior During Therapy [facility] for tasks assessed/performed           Past Medical History:  Diagnosis Date  . Adenocarcinoma of right lung ([place]) 01/06/2016  . Anginal chest pain at rest [facility])    Chronic non, controlled on Lorazepam  . Anxiety   . Arthritis    "all over"   . BPH (benign prostatic hyperplasia)   . CAD (coronary artery disease)   . Chronic lower back pain   . Complication of anesthesia    "problems making water afterwards"  . Depression   . DJD (degenerative joint disease)   . Esophageal ulcer without bleeding    bid ppi indefinitely  . Family history of adverse reaction to anesthesia    "children w/PONV"  . GERD (gastroesophageal reflux disease)   . Headache    "couple/week maybe" (09/04/2015)  . Hemochromatosis    Possible, elevated iron stores, hook-like osteophytes on hand films, normal LFTs  . Hepatitis    "yellow jaundice as a baby"  . History of ASCVD    MULTIVESSEL  . Hyperlipidemia   . Hypertension   . Osteoarthritis   . Pneumonia    "several times; got a little now" (09/04/2015)  . Rheumatoid arthritis ([place])   . Subdural  hematoma (H[facility])    small after fall 09/2016-plavix held, neurosurgery consulted.  Asymptomatic.    Marland Kitchen 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[place]) 02/2018   on Lovenox lifelong since he failed PO Eliquis    Past Surgical History:  Procedure Laterality Date  . ARM SKIN LESION BIOPSY / EXCISION Left 09/02/2015  . CATARACT EXTRACTION W/ INTRAOCULAR LENS  IMPLANT, BILATERAL Bilateral   . CHOLECYSTECTOMY OPEN    . CORNEAL TRANSPLANT Bilateral    "one at [facility]; one at [facility]"  . CORONARY ANGIOPLASTY WITH STENT PLACEMENT    . CORONARY ARTERY BYPASS GRAFT  1999  . DESCENDING AORTIC ANEURYSM REPAIR W/ STENT     DE stent ostium into the right radial free graft at OM-, 02-2007  . ESOPHAGOGASTRODUODENOSCOPY N/A 11/09/2014   Procedure: ESOPHAGOGASTRODUODENOSCOPY (EGD);  Surgeon: [name], MD;  Location: [facility] ENDOSCOPY;  Service: Endoscopy;  Laterality: N/A;  . INGUINAL HERNIA REPAIR    . JOINT REPLACEMENT    . LEFT HEART CATH AND CORS/GRAFTS ANGIOGRAPHY N/A 06/27/2017   Procedure: LEFT HEART CATH AND CORS/GRAFTS ANGIOGRAPHY;  Surgeon: [name], MD;  Location: [place] CV LAB;  Service: Cardiovascular;  Laterality: N/A;  . NASAL SINUS SURGERY    . SHOULDER OPEN ROTATOR CUFF  REPAIR Right   . TOTAL KNEE ARTHROPLASTY Bilateral     There were no vitals filed for this visit.   Subjective Assessment - 09/26/19 1053    Subjective Pt and daughter arrived with [facility] for session and high ankle shoes.    Pertinent History CVA effecting RT side on 07/13/18, lumbar fracture December 2021    Patient Stated Goals Be able to walk    Currently in Pain? Yes    Pain Score 5     Pain Location Leg    Pain Orientation Right    Pain Descriptors / Indicators Aching    Pain Type Chronic pain    Pain Onset More than a month ago    Pain Frequency Intermittent    Aggravating Factors  movement    Pain Relieving Factors rest    Effect of Pain on Daily Activities mod effect                             [facility] Adult PT  Treatment/Exercise - 09/26/19 0001      Ambulation/Gait   Ambulation/Gait Yes    Ambulation/Gait Assistance 6: Modified independent (Device/Increase time);5: Supervision    Ambulation Distance (Feet) 226 Feet    Assistive device Large base quad cane    Gait Pattern Step-to pattern;Decreased step length - left;Decreased stride length;Decreased dorsiflexion - right;Ataxic;Decreased step length - right;Decreased stance time - right    Ambulation Surface Level;Indoor    Gait Comments 3 sets; 2 seated rest breaks      Knee/Hip Exercises: Standing   Forward Step Up Both;5 reps;Hand Hold: 2;Step Height: 4"    Functional Squat 2 sets;5 reps    Other Standing Knee Exercises partial tandem stance 2x 30" intermittent HHA; sidestep 2RT inside // bars    Other Standing Knee Exercises 4 inch box taps alternating x10 reps      Knee/Hip Exercises: Seated   Sit to Sand 10 reps   1 HHA                   PT Short Term Goals - 09/19/19 0945      PT SHORT TERM GOAL #1   Title Patient will be independent with initial HEP to improve functional outcomes    Baseline Reports compliance    Time 3    Period Weeks    Status Achieved    Target Date 08/02/19      PT SHORT TERM GOAL #2   Title Patient will be able to perform stand x 5 in < 40 seconds to demonstrate improvement in functional mobility and reduced risk for falls.    Baseline current 38.8 sec    Time 3    Period Weeks    Status Achieved    Target Date 08/02/19             PT Long Term Goals - 09/19/19 0946      PT LONG TERM GOAL #1   Title Patient will be able to perform stand x 5 in < 25 seconds to demonstrate improvement in functional mobility and reduced risk for falls.    Baseline current 38.8 sec    Time 6    Period Weeks    Status On-going      PT LONG TERM GOAL #2   Title Patient will be able to perform TUG in <15 seconds, using least restrictive assistive device, to demonstrate improvement in functional mobility  and  reduced risk for falls.    Baseline current 40 sec with RW    Time 6    Period Weeks    Status On-going      PT LONG TERM GOAL #3   Title Patient will have equal to or > 4/5 MMT throughout BLE to improve ability to perform functional mobility, stair ambulation and ADLs.    Baseline See MMT    Time 6    Period Weeks    Status Achieved                 Plan - 09/26/19 1142    Clinical Impression Statement Pt brought LBQC to session today.  Increased cane height to appropriate and reviewed sequence and pt able to complete with improved activity toleranced with LRAD.  Pt required only 2 seated rest breaks during gait today.  Vast improvements wiht Rt foot placement by wearing high ankle boots.  Daughter reports brace should arrive next week.  Pt does continues to fatigue quickly requiring seated rest breaks between activities.  Added static partial tandem stance for balance training, intermittent HHA required.  Functional strengthening exercises complete as well for hip strengthening.  EOS pt was limited by fatigue.    Personal Factors and Comorbidities Age;Comorbidity 2    Comorbidities HX ov CVA, lumbar fracture    Examination-Activity Limitations Bathing;Bed Mobility;Bend;Squat;Stairs;Carry;Stand;Toileting;Dressing;Transfers;Hygiene/Grooming;Lift;Locomotion Level    Examination-Participation Restrictions Community Activity;Other;Cleaning    Stability/Clinical Decision Making Stable/Uncomplicated    Clinical Decision Making Low    Rehab Potential Fair    PT Frequency 2x / week    PT Duration 4 weeks    PT Treatment/Interventions ADLs/Self Care Home Management;Aquatic Therapy;Biofeedback;Cryotherapy;Electrical Stimulation;Iontophoresis 4mg /ml Dexamethasone;Moist Heat;Traction;Balance training;Manual techniques;Therapeutic exercise;Vasopneumatic Device;Wheelchair mobility training;Therapeutic activities;Splinting;Taping;Functional mobility training;Stair training;Orthotic  Fit/Training;Gait training;DME Instruction;Patient/family education;Passive range of motion;Dry needling;Energy conservation;Other (comment);Joint Manipulations;Spinal Manipulations;Compression bandaging;Neuromuscular re-education;Cognitive remediation;Ultrasound;Parrafin;Fluidtherapy;Contrast Bath    PT Next Visit Plan Continue to progress LE strength, coordination, and balance with emphasis on gait and functional mobility/ transfers. Continue gait training with [facility] next visit.    PT Home Exercise Plan 07/10/19: seated march, LAQs, seated heel/toe raise; 07/15/19: unsupported sitting 1-2 min rounds; 08/15/19: static standing at sink or counter 09/04/19: seated windsheild wiper, ankle eversion with RTB    Consulted and Agree with Plan of Care Patient;Family member/caregiver    Family Member Consulted Daughter           Patient will benefit from skilled therapeutic intervention in order to improve the following deficits and impairments:  Abnormal gait, Decreased endurance, Hypomobility, Impaired sensation, Decreased activity tolerance, Decreased strength, Impaired UE functional use, Decreased balance, Decreased mobility, Difficulty walking, Decreased coordination, Decreased safety awareness, Postural dysfunction  Visit Diagnosis: Muscle weakness (generalized)  Other abnormalities of gait and mobility     Problem List Patient Active Problem List   Diagnosis Date Noted  . Sundowning 07/04/2019  . Bilateral hearing loss 04/16/2019  . Urinary frequency 03/21/2019  . Chronic right shoulder pain 12/19/2018  . Weakness generalized 09/28/2018  . Palliative care encounter 09/28/2018  . Pressure injury of skin 08/27/2018  . COPD (chronic obstructive pulmonary disease) ([place]) 08/26/2018  . AMS (altered mental status) 08/22/2018  . Altered mental status 08/21/2018  . UTI (urinary tract infection) 08/21/2018  . Abnormality of gait 08/20/2018  . Prolonged Q-T interval on ECG   . Dysphagia, post-stroke    . Supplemental oxygen dependent   . Anxiety state   . Fatigue   . SOB (shortness of breath)   . Hyperglycemia   .  History of lung cancer   . Depression   . CKD (chronic kidney disease), stage III   . Acute blood loss anemia   . Fall 07/16/2018  . 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 Acute spontaneous 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 ([place]) L thalamic 07/14/2018  . Cellulitis of arm, right 06/01/2018  . Rash and nonspecific skin eruption 06/01/2018  . Chest pain 02/02/2018  . Thrombocytopenia ([place]) 02/02/2018  . Hypokalemia 02/02/2018  . Pulmonary embolism ([place]) 02/02/2018  . Elevated troponin 02/02/2018  . Pulmonary embolus ([place]) 02/02/2018  . Non-ST elevation MI (NSTEMI) ([place]) 06/27/2017  . Diastolic CHF ([place]) 54/65/0354  . Chest pain due to CAD ([place]) 06/26/2017  . Dyspnea 06/21/2017  . Acute bronchitis 05/15/2017  . Oral candidiasis 05/15/2017  . Acute pericarditis   . Chest pain at rest 12/20/2016  . Angina pectoris ([place]) 12/20/2016  . Radiation pneumonitis ([place]) 11/09/2016  . Pleurisy 10/11/2016  . Chest wall pain 10/11/2016  . Atrial fibrillation ([place]) 05/21/2016  . Adenocarcinoma of right lung ([place]) 01/06/2016  . GAD (generalized anxiety disorder) 11/20/2015  . Essential hypertension   . Cellulitis 09/04/2015  . Cellulitis of left axilla   . Esophageal ulcer without bleeding   . Bilateral lower extremity edema 04/28/2014  . BPH (benign prostatic hyperplasia) 10/08/2012  . Anxiety   . EKG abnormalities 09/05/2012  . Tremor 09/05/2012  . Chronic fatigue 09/05/2012  . Arthritis   . Asthma, chronic   . Reflux esophagitis   . S/P CABG (coronary artery bypass graft)   . GERD (gastroesophageal reflux disease)   . Hyperlipidemia   . Hypertension   . UNSPECIFIED PERIPHERAL VASCULAR DISEASE 06/16/2009   Ihor Austin, LPTA/CLT; CBIS [phone]  Aldona Lento 09/26/2019,  11:58 AM  [place] [address] [place], [place], [address] Phone: [phone]   Fax:  [phone]  Name: AZARYAH OLEKSY MRN: [id number] Date of Birth: 04-05-1927

## 2019-09-30 ENCOUNTER — Other Ambulatory Visit: Payer: Self-pay | Admitting: Interventional Cardiology

## 2019-10-01 ENCOUNTER — Encounter (HOSPITAL_COMMUNITY): Payer: Self-pay | Admitting: Occupational Therapy

## 2019-10-01 ENCOUNTER — Ambulatory Visit (HOSPITAL_COMMUNITY): Payer: Medicare HMO | Admitting: Occupational Therapy

## 2019-10-01 ENCOUNTER — Other Ambulatory Visit: Payer: Self-pay | Admitting: Interventional Cardiology

## 2019-10-01 ENCOUNTER — Other Ambulatory Visit: Payer: Self-pay

## 2019-10-01 ENCOUNTER — Telehealth (HOSPITAL_COMMUNITY): Payer: Self-pay | Admitting: Physical Therapy

## 2019-10-01 DIAGNOSIS — R2689 Other abnormalities of gait and mobility: Secondary | ICD-10-CM | POA: Diagnosis not present

## 2019-10-01 DIAGNOSIS — R29898 Other symptoms and signs involving the musculoskeletal system: Secondary | ICD-10-CM | POA: Diagnosis not present

## 2019-10-01 DIAGNOSIS — R278 Other lack of coordination: Secondary | ICD-10-CM

## 2019-10-01 DIAGNOSIS — M6281 Muscle weakness (generalized): Secondary | ICD-10-CM | POA: Diagnosis not present

## 2019-10-01 NOTE — Telephone Encounter (Signed)
Pt cx 6/30  he was on the wait list to get PT and OT together and we have scheduled that on 7/1.

## 2019-10-01 NOTE — Therapy (Signed)
[place], [place], [address] Phone: [phone]   Fax:  [phone]  Occupational Therapy Treatment  Patient Details  Name: Evan [name] MRN: [id number] Date of Birth: May 20, 1927 Referring Provider (OT): [name]   Encounter Date: 10/01/2019   OT End of Session - 10/01/19 1124    Visit Number 16    Number of Visits 18    Date for OT Re-Evaluation 10/08/19    Authorization Type [employer] HMO    Authorization Time Period 8 visits approved (09/12/19-10/11/19) $40 copay    Authorization - Visit Number 6    Authorization - Number of Visits 8    Progress Note Due on Visit 20    OT Start Time 0948    OT Stop Time 1026    OT Time Calculation (min) 38 min    Activity Tolerance Patient tolerated treatment well    Behavior During Therapy [facility] for tasks assessed/performed           Past Medical History:  Diagnosis Date  . Adenocarcinoma of right lung ([place]) 01/06/2016  . Anginal chest pain at rest [facility])    Chronic non, controlled on Lorazepam  . Anxiety   . Arthritis    "all over"   . BPH (benign prostatic hyperplasia)   . CAD (coronary artery disease)   . Chronic lower back pain   . Complication of anesthesia    "problems making water afterwards"  . Depression   . DJD (degenerative joint disease)   . Esophageal ulcer without bleeding    bid ppi indefinitely  . Family history of adverse reaction to anesthesia    "children w/PONV"  . GERD (gastroesophageal reflux disease)   . Headache    "couple/week maybe" (09/04/2015)  . Hemochromatosis    Possible, elevated iron stores, hook-like osteophytes on hand films, normal LFTs  . Hepatitis    "yellow jaundice as a baby"  . History of ASCVD    MULTIVESSEL  . Hyperlipidemia   . Hypertension   . Osteoarthritis   . Pneumonia    "several times; got a little now" (09/04/2015)  . Rheumatoid arthritis ([place])   . Subdural hematoma ([facility])    small after fall  09/2016-plavix held, neurosurgery consulted.  Asymptomatic.    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 ARM SKIN LESION BIOPSY / EXCISION Left 09/02/2015  . CATARACT EXTRACTION W/ INTRAOCULAR LENS  IMPLANT, BILATERAL Bilateral   . CHOLECYSTECTOMY OPEN    . CORNEAL TRANSPLANT Bilateral    "one at [facility]; one at [facility]"  . CORONARY ANGIOPLASTY WITH STENT PLACEMENT    . CORONARY ARTERY BYPASS GRAFT  1999  . DESCENDING AORTIC ANEURYSM REPAIR W/ STENT     DE stent ostium into the right radial free graft at OM-, 02-2007  . ESOPHAGOGASTRODUODENOSCOPY N/A 11/09/2014   Procedure: ESOPHAGOGASTRODUODENOSCOPY (EGD);  Surgeon: [name], MD;  Location: [facility] ENDOSCOPY;  Service: Endoscopy;  Laterality: N/A;  . INGUINAL HERNIA REPAIR    . JOINT REPLACEMENT    . LEFT HEART CATH AND CORS/GRAFTS ANGIOGRAPHY N/A 06/27/2017   Procedure: LEFT HEART CATH AND CORS/GRAFTS ANGIOGRAPHY;  Surgeon: [name], MD;  Location: [place] CV LAB;  Service: Cardiovascular;  Laterality: N/A;  . NASAL SINUS SURGERY    . SHOULDER OPEN ROTATOR CUFF REPAIR Right   . TOTAL KNEE ARTHROPLASTY Bilateral  There were no vitals filed for this visit.   Subjective Assessment - 10/01/19 0946    Subjective  S: Well my arm is still here.    Patient is accompanied by: Family member   daughter   Currently in Pain? No/denies              [facility] OT Assessment - 10/01/19 0946      Assessment   Medical Diagnosis Weakness      Precautions   Precautions Fall                    OT Treatments/Exercises (OP) - 10/01/19 0949      Shoulder Exercises: Stretch   Table Stretch - Flexion 5 reps    Table Stretch -Flexion Limitations Cues for palm on table. Min A for normalized movement patterns.       Hand Exercises   Other Hand Exercises Pt gripping pvc pipe in right hand, holding and pushing into putty to cut circles.  Working on grasp and A/ROM      Neurological Re-education Exercises   Other Exercises 2 Pt completing RUE strengthening and coordination task-holding volleyball with both hands and completing chest press, flexion, and PNF pattern diagonals each direction, while wearing 1# wrist weight. Pt able to maintain hold on the ball for 5 consecutive repetitions. Pt completed one set between pegboard tasks.       Fine Motor Coordination (Hand/Wrist)   Fine Motor Coordination Large Pegboard    Large Pegboard Pt gripping pegs with set-up in left hand or from OT, placing along pegboard in left to right pattern, top to bottom, and right to left diagonal pattern. Increased ataxia initially, added 1# wrist weight and alternated with gross motor movement pattern with improved control and coordination.                     OT Short Term Goals - 09/17/19 1142      OT SHORT TERM GOAL #1   Title Patient and family will be educated and verbalize independence with HEP in order to increase functional use of his right hand while using for 25% or more of daily tasks.    Time 6    Period Weeks    Status On-going    Target Date 10/08/19      OT SHORT TERM GOAL #2   Title Patient will increase scapular and proximal shoulder strengthening while demonstrating increased shoulder endurance during a sustained functional reaching task for more than 1 minute.    Time 6    Period Weeks    Status On-going      OT SHORT TERM GOAL #3   Title Patient will increase gross motor coordination by moving 10 or more blocks during the blocks and box test allow him to complete self feeding tasks with less difficulty.    Baseline 6/15: 9 blocks    Time 6    Period Weeks    Status On-going      OT SHORT TERM GOAL #4   Title Patient will increase his right hand grip strength by 6# and his pinch strength by 3# in order to hold his utensil during meals without dropping or fatigue.    Time 6    Period Weeks    Status On-going       OT SHORT TERM GOAL #5   Title Patient will be educated on adaptive eqipment/strategies that may help improve overall motor control when completing basic self  care tasks.    Time 6    Period Weeks    Status On-going      Additional Short Term Goals   Additional Short Term Goals Yes      OT SHORT TERM GOAL #6   Title Pt will be demonstrate use of RUE as an assist to increase independence in during ADL completion.    Time 3    Period Weeks    Status New                    Plan - 10/01/19 1124    Clinical Impression Statement A: Session focusing on fine motor task broken up with gross motor movement task intermittently to improve control and success with pegboard. Pt did well with alternating gross and fine motor tasks today, added 1# wrist weight for improved control during peg placement. Added theraputty task for grip strengthening, pt with difficulty pushing into putty and turning, cuing required for sequencing.    Body Structure / Function / Physical Skills ADL;UE functional use;FMC;GMC;Coordination;IADL;Dexterity;Strength    Plan P: Begin preparing for discharge at next session. Discuss any final tasks that he may want to work on. continue with fine motor tasks working on Visual merchandiser    OT Home Exercise Plan eval: stress ball for grip and pinch strengthening. 5/13: red theraband proximal shoulder strengthening.    Consulted and Agree with Plan of Care Patient;Family member/caregiver    Family Member Consulted Daughter; [name]           Patient will benefit from skilled therapeutic intervention in order to improve the following deficits and impairments:   Body Structure / Function / Physical Skills: ADL, UE functional use, FMC, GMC, Coordination, IADL, Dexterity, Strength       Visit Diagnosis: Other lack of coordination  Other symptoms and signs involving the musculoskeletal system    Problem List Patient Active Problem List   Diagnosis Date Noted  .  Sundowning 07/04/2019  . Bilateral hearing loss 04/16/2019  . Urinary frequency 03/21/2019  . Chronic right shoulder pain 12/19/2018  . Weakness generalized 09/28/2018  . Palliative care encounter 09/28/2018  . Pressure injury of skin 08/27/2018  . COPD (chronic obstructive pulmonary disease) ([place]) 08/26/2018  . AMS (altered mental status) 08/22/2018  . Altered mental status 08/21/2018  . UTI (urinary tract infection) 08/21/2018  . Abnormality of gait 08/20/2018  . Prolonged Q-T interval on ECG   . Dysphagia, post-stroke   . Supplemental oxygen dependent   . Anxiety state   . Fatigue   . SOB (shortness of breath)   . Hyperglycemia   . History of lung cancer   . Depression   . CKD (chronic kidney disease), stage III   . Acute blood loss anemia   . Fall 07/16/2018  . 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 Acute spontaneous 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 ([place]) L thalamic 07/14/2018  . Cellulitis of arm, right 06/01/2018  . Rash and nonspecific skin eruption 06/01/2018  . Chest pain 02/02/2018  . Thrombocytopenia ([place]) 02/02/2018  . Hypokalemia 02/02/2018  . Pulmonary embolism ([place]) 02/02/2018  . Elevated troponin 02/02/2018  . Pulmonary embolus ([place]) 02/02/2018  . Non-ST elevation MI (NSTEMI) ([place]) 06/27/2017  . Diastolic CHF ([place]) 40/98/1191  . Chest pain due to CAD ([place]) 06/26/2017  . Dyspnea 06/21/2017  . Acute bronchitis 05/15/2017  . Oral candidiasis 05/15/2017  . Acute pericarditis   . Chest pain at  rest 12/20/2016  . Angina pectoris ([place]) 12/20/2016  . Radiation pneumonitis ([place]) 11/09/2016  . Pleurisy 10/11/2016  . Chest wall pain 10/11/2016  . Atrial fibrillation ([place]) 05/21/2016  . Adenocarcinoma of right lung ([place]) 01/06/2016  . GAD (generalized anxiety disorder) 11/20/2015  . Essential hypertension   . Cellulitis 09/04/2015  . Cellulitis of left axilla   . Esophageal ulcer  without bleeding   . Bilateral lower extremity edema 04/28/2014  . BPH (benign prostatic hyperplasia) 10/08/2012  . Anxiety   . EKG abnormalities 09/05/2012  . Tremor 09/05/2012  . Chronic fatigue 09/05/2012  . Arthritis   . Asthma, chronic   . Reflux esophagitis   . S/P CABG (coronary artery bypass graft)   . GERD (gastroesophageal reflux disease)   . Hyperlipidemia   . Hypertension   . UNSPECIFIED PERIPHERAL VASCULAR DISEASE 06/16/2009   Guadelupe Sabin, OTR/L  [phone] 10/01/2019, 11:29 AM  [place] [address] [place], [place], [address] Phone: [phone]   Fax:  [phone]  Name: Evan [name] MRN: [id number] Date of Birth: [birth year]

## 2019-10-02 ENCOUNTER — Ambulatory Visit (HOSPITAL_COMMUNITY): Payer: Medicare HMO | Admitting: Physical Therapy

## 2019-10-02 DIAGNOSIS — S0190XA Unspecified open wound of unspecified part of head, initial encounter: Secondary | ICD-10-CM | POA: Diagnosis not present

## 2019-10-02 DIAGNOSIS — L89519 Pressure ulcer of right ankle, unspecified stage: Secondary | ICD-10-CM | POA: Diagnosis not present

## 2019-10-02 DIAGNOSIS — L8940 Pressure ulcer of contiguous site of back, buttock and hip, unspecified stage: Secondary | ICD-10-CM | POA: Diagnosis not present

## 2019-10-03 ENCOUNTER — Ambulatory Visit (HOSPITAL_COMMUNITY): Payer: Medicare HMO

## 2019-10-03 ENCOUNTER — Encounter (HOSPITAL_COMMUNITY): Payer: Self-pay

## 2019-10-03 ENCOUNTER — Other Ambulatory Visit: Payer: Self-pay

## 2019-10-03 ENCOUNTER — Encounter (HOSPITAL_COMMUNITY): Payer: Self-pay | Admitting: Specialist

## 2019-10-03 ENCOUNTER — Ambulatory Visit (HOSPITAL_COMMUNITY): Payer: Medicare HMO | Attending: Family Medicine | Admitting: Specialist

## 2019-10-03 DIAGNOSIS — R29898 Other symptoms and signs involving the musculoskeletal system: Secondary | ICD-10-CM | POA: Insufficient documentation

## 2019-10-03 DIAGNOSIS — R278 Other lack of coordination: Secondary | ICD-10-CM | POA: Insufficient documentation

## 2019-10-03 DIAGNOSIS — M6281 Muscle weakness (generalized): Secondary | ICD-10-CM

## 2019-10-03 DIAGNOSIS — R2689 Other abnormalities of gait and mobility: Secondary | ICD-10-CM | POA: Diagnosis not present

## 2019-10-03 DIAGNOSIS — M21371 Foot drop, right foot: Secondary | ICD-10-CM | POA: Diagnosis not present

## 2019-10-03 NOTE — Therapy (Signed)
[place], [place], [address] Phone: [phone]   Fax:  [phone]  Occupational Therapy Treatment  Patient Details  Name: Evan [name] MRN: [id number] Date of Birth: Jul 06, 1927 Referring Provider (OT): [name]   Encounter Date: 10/03/2019   OT End of Session - 10/03/19 1326    Visit Number 17    Number of Visits 18    Date for OT Re-Evaluation 10/08/19    Authorization Type [employer] HMO    Authorization Time Period 8 visits approved (09/12/19-10/11/19) $40 copay    Authorization - Visit Number 7    Authorization - Number of Visits 8    Progress Note Due on Visit 20    OT Start Time 0945    OT Stop Time 1025    OT Time Calculation (min) 40 min    Activity Tolerance Patient tolerated treatment well    Behavior During Therapy [facility] for tasks assessed/performed           Past Medical History:  Diagnosis Date  . Adenocarcinoma of right lung ([place]) 01/06/2016  . Anginal chest pain at rest [facility])    Chronic non, controlled on Lorazepam  . Anxiety   . Arthritis    "all over"   . BPH (benign prostatic hyperplasia)   . CAD (coronary artery disease)   . Chronic lower back pain   . Complication of anesthesia    "problems making water afterwards"  . Depression   . DJD (degenerative joint disease)   . Esophageal ulcer without bleeding    bid ppi indefinitely  . Family history of adverse reaction to anesthesia    "children w/PONV"  . GERD (gastroesophageal reflux disease)   . Headache    "couple/week maybe" (09/04/2015)  . Hemochromatosis    Possible, elevated iron stores, hook-like osteophytes on hand films, normal LFTs  . Hepatitis    "yellow jaundice as a baby"  . History of ASCVD    MULTIVESSEL  . Hyperlipidemia   . Hypertension   . Osteoarthritis   . Pneumonia    "several times; got a little now" (09/04/2015)  . Rheumatoid arthritis ([place])   . Subdural hematoma (H[facility])    small after fall  09/2016-plavix held, neurosurgery consulted.  Asymptomatic.    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 ARM SKIN LESION BIOPSY / EXCISION Left 09/02/2015  . CATARACT EXTRACTION W/ INTRAOCULAR LENS  IMPLANT, BILATERAL Bilateral   . CHOLECYSTECTOMY OPEN    . CORNEAL TRANSPLANT Bilateral    "one at [facility]; one at [facility]"  . CORONARY ANGIOPLASTY WITH STENT PLACEMENT    . CORONARY ARTERY BYPASS GRAFT  1999  . DESCENDING AORTIC ANEURYSM REPAIR W/ STENT     DE stent ostium into the right radial free graft at OM-, 02-2007  . ESOPHAGOGASTRODUODENOSCOPY N/A 11/09/2014   Procedure: ESOPHAGOGASTRODUODENOSCOPY (EGD);  Surgeon: [name], MD;  Location: [facility] ENDOSCOPY;  Service: Endoscopy;  Laterality: N/A;  . INGUINAL HERNIA REPAIR    . JOINT REPLACEMENT    . LEFT HEART CATH AND CORS/GRAFTS ANGIOGRAPHY N/A 06/27/2017   Procedure: LEFT HEART CATH AND CORS/GRAFTS ANGIOGRAPHY;  Surgeon: [name], MD;  Location: [place] CV LAB;  Service: Cardiovascular;  Laterality: N/A;  . NASAL SINUS SURGERY    . SHOULDER OPEN ROTATOR CUFF REPAIR Right   . TOTAL KNEE ARTHROPLASTY Bilateral  There were no vitals filed for this visit.   Subjective Assessment - 10/03/19 1325    Subjective  S:  I cant cut my food up              [facility] OT Assessment - 10/03/19 1329      Assessment   Medical Diagnosis Weakness      Precautions   Precautions Fall                    OT Treatments/Exercises (OP) - 10/03/19 1329      ADLs   Eating patient is not able to cut food:  used a roll of yellow tputty to simuate food on plate.  Patient given regular fork and knife and due to gmc deficits and tremors, utensils fell out of hand.  OT presented patient with weighted handle fork for left hand and rocker knife for right hand.  Paitent able to maintain grip with min difficulty.  OT provided mod facilitation at elbow  and wrist to improve GMC and decrease tremors while he sliced tputty into bite size pieces with mod difficulty.     ADL Comments daughter asked how patient could don tennis shoes safely - patient did not have tennis shoes on this date, instead had on high top boots that require max pa to don.  OT demonstrated 2 methods to don tennis shoe:  1:  loosen laces or velco straps and use reacher or long handled shoe horn to lower shoe to floor, slide foot in shoe using shoe horn, as needed, use reacher to fasten velcro strap.  2: cross right leg over left and use hand to place loosened shoe on toe and then heel, and fasten velcro.  Daughter stated they would bring tennis shoe to next session to practice       Shoulder Exercises: Seated   Protraction AAROM;5 reps   2 sets    Horizontal ABduction AAROM;5 reps   2 sets    Flexion AAROM;5 reps   2 sets      Fine Motor Coordination (Hand/Wrist)   Fine Motor Coordination Large Pegboard    Large Pegboard using long shot gun shell and shell holder, patient used right hand to pick up shells with max difficulty and mod facilitation from OT at elbow and at wrist to decrease tremors and assist with [place], able to place and remove pegs with mod-max difficulty and increased time in this manner                   OT Education - 10/03/19 1325    Education Details provided information on how to purchase weighted utensils, should family wish to do so    Person(s) Educated Patient;Child(ren)    Methods Explanation;Demonstration    Comprehension Verbalized understanding            OT Short Term Goals - 09/17/19 1142      OT SHORT TERM GOAL #1   Title Patient and family will be educated and verbalize independence with HEP in order to increase functional use of his right hand while using for 25% or more of daily tasks.    Time 6    Period Weeks    Status On-going    Target Date 10/08/19      OT SHORT TERM GOAL #2   Title Patient will increase scapular and  proximal shoulder strengthening while demonstrating increased shoulder endurance during a sustained functional reaching task for more than 1 minute.  Time 6    Period Weeks    Status On-going      OT SHORT TERM GOAL #3   Title Patient will increase gross motor coordination by moving 10 or more blocks during the blocks and box test allow him to complete self feeding tasks with less difficulty.    Baseline 6/15: 9 blocks    Time 6    Period Weeks    Status On-going      OT SHORT TERM GOAL #4   Title Patient will increase his right hand grip strength by 6# and his pinch strength by 3# in order to hold his utensil during meals without dropping or fatigue.    Time 6    Period Weeks    Status On-going      OT SHORT TERM GOAL #5   Title Patient will be educated on adaptive eqipment/strategies that may help improve overall motor control when completing basic self care tasks.    Time 6    Period Weeks    Status On-going      Additional Short Term Goals   Additional Short Term Goals Yes      OT SHORT TERM GOAL #6   Title Pt will be demonstrate use of RUE as an assist to increase independence in during ADL completion.    Time 3    Period Weeks    Status New                    Plan - 10/03/19 1326    Clinical Impression Statement A:  Due to decreased [place] and tremors, patient had max difficulty cutting yellow theraputty with weighted utensils.  When OT provided mod hand over hand pa, patient could complete task with increased time and mod difficulty.    Body Structure / Function / Physical Skills ADL;UE functional use;FMC;GMC;Coordination;IADL;Dexterity;Strength    Plan P:  DC next session.  if patient brings tennis shoe instruct on safe donning and doffing technique           Patient will benefit from skilled therapeutic intervention in order to improve the following deficits and impairments:   Body Structure / Function / Physical Skills: ADL, UE functional use, FMC, GMC,  Coordination, IADL, Dexterity, Strength       Visit Diagnosis: Other lack of coordination  Other symptoms and signs involving the musculoskeletal system  Muscle weakness (generalized)    Problem List Patient Active Problem List   Diagnosis Date Noted  . Sundowning 07/04/2019  . Bilateral hearing loss 04/16/2019  . Urinary frequency 03/21/2019  . Chronic right shoulder pain 12/19/2018  . Weakness generalized 09/28/2018  . Palliative care encounter 09/28/2018  . Pressure injury of skin 08/27/2018  . COPD (chronic obstructive pulmonary disease) ([place]) 08/26/2018  . AMS (altered mental status) 08/22/2018  . Altered mental status 08/21/2018  . UTI (urinary tract infection) 08/21/2018  . Abnormality of gait 08/20/2018  . Prolonged Q-T interval on ECG   . Dysphagia, post-stroke   . Supplemental oxygen dependent   . Anxiety state   . Fatigue   . SOB (shortness of breath)   . Hyperglycemia   . History of lung cancer   . Depression   . CKD (chronic kidney disease), stage III   . Acute blood loss anemia   . Fall 07/16/2018  . 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 Acute spontaneous 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 ([place]) L thalamic 07/14/2018  . Cellulitis  of arm, right 06/01/2018  . Rash and nonspecific skin eruption 06/01/2018  . Chest pain 02/02/2018  . Thrombocytopenia ([place]) 02/02/2018  . Hypokalemia 02/02/2018  . Pulmonary embolism ([place]) 02/02/2018  . Elevated troponin 02/02/2018  . Pulmonary embolus ([place]) 02/02/2018  . Non-ST elevation MI (NSTEMI) ([place]) 06/27/2017  . Diastolic CHF ([place]) 93/81/8299  . Chest pain due to CAD ([place]) 06/26/2017  . Dyspnea 06/21/2017  . Acute bronchitis 05/15/2017  . Oral candidiasis 05/15/2017  . Acute pericarditis   . Chest pain at rest 12/20/2016  . Angina pectoris ([place]) 12/20/2016  . Radiation pneumonitis ([place]) 11/09/2016  . Pleurisy 10/11/2016  . Chest  wall pain 10/11/2016  . Atrial fibrillation ([place]) 05/21/2016  . Adenocarcinoma of right lung ([place]) 01/06/2016  . GAD (generalized anxiety disorder) 11/20/2015  . Essential hypertension   . Cellulitis 09/04/2015  . Cellulitis of left axilla   . Esophageal ulcer without bleeding   . Bilateral lower extremity edema 04/28/2014  . BPH (benign prostatic hyperplasia) 10/08/2012  . Anxiety   . EKG abnormalities 09/05/2012  . Tremor 09/05/2012  . Chronic fatigue 09/05/2012  . Arthritis   . Asthma, chronic   . Reflux esophagitis   . S/P CABG (coronary artery bypass graft)   . GERD (gastroesophageal reflux disease)   . Hyperlipidemia   . Hypertension   . UNSPECIFIED PERIPHERAL VASCULAR DISEASE 06/16/2009    Vangie Bicker, [place], OTR/L [phone]  10/03/2019, 1:36 PM  [facility] [address] [place], [place], [address] Phone: [phone]   Fax:  [phone]  Name: Evan [name] MRN: [id number] Date of Birth: 02-Aug-1927

## 2019-10-03 NOTE — Therapy (Signed)
[place], [place], [address] Phone: [phone]   Fax:  [phone]  Physical Therapy Treatment  Patient Details  Name: Evan [name] MRN: [id number] Date of Birth: [birth year] Referring Provider (PT): [name] MD    Encounter Date: 10/03/2019   PT End of Session - 10/03/19 1106    Visit Number 23    Number of Visits 28    Date for PT Re-Evaluation 10/18/19    Authorization Type [employer]    Authorization Time Period approved 8 visits form 6/17--> 10/18/19    Authorization - Visit Number 3    Authorization - Number of Visits 8    Progress Note Due on Visit 28    PT Start Time 6962    PT Stop Time 1128    PT Time Calculation (min) 45 min    Equipment Utilized During Treatment Gait belt    Activity Tolerance Patient limited by fatigue;Patient limited by pain    Behavior During Therapy [facility] for tasks assessed/performed           Past Medical History:  Diagnosis Date  . Adenocarcinoma of right lung ([place]) 01/06/2016  . Anginal chest pain at rest [facility])    Chronic non, controlled on Lorazepam  . Anxiety   . Arthritis    "all over"   . BPH (benign prostatic hyperplasia)   . CAD (coronary artery disease)   . Chronic lower back pain   . Complication of anesthesia    "problems making water afterwards"  . Depression   . DJD (degenerative joint disease)   . Esophageal ulcer without bleeding    bid ppi indefinitely  . Family history of adverse reaction to anesthesia    "children w/PONV"  . GERD (gastroesophageal reflux disease)   . Headache    "couple/week maybe" (09/04/2015)  . Hemochromatosis    Possible, elevated iron stores, hook-like osteophytes on hand films, normal LFTs  . Hepatitis    "yellow jaundice as a baby"  . History of ASCVD    MULTIVESSEL  . Hyperlipidemia   . Hypertension   . Osteoarthritis   . Pneumonia    "several times; got a little now" (09/04/2015)  . Rheumatoid arthritis  ([place])   . Subdural hematoma (H[facility])    small after fall 09/2016-plavix held, neurosurgery consulted.  Asymptomatic.    Marland Kitchen 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[place]) 02/2018   on Lovenox lifelong since he failed PO Eliquis    Past Surgical History:  Procedure Laterality Date  . ARM SKIN LESION BIOPSY / EXCISION Left 09/02/2015  . CATARACT EXTRACTION W/ INTRAOCULAR LENS  IMPLANT, BILATERAL Bilateral   . CHOLECYSTECTOMY OPEN    . CORNEAL TRANSPLANT Bilateral    "one at [facility]; one at [facility]"  . CORONARY ANGIOPLASTY WITH STENT PLACEMENT    . CORONARY ARTERY BYPASS GRAFT  1999  . DESCENDING AORTIC ANEURYSM REPAIR W/ STENT     DE stent ostium into the right radial free graft at OM-, 02-2007  . ESOPHAGOGASTRODUODENOSCOPY N/A 11/09/2014   Procedure: ESOPHAGOGASTRODUODENOSCOPY (EGD);  Surgeon: [name], MD;  Location: [facility] ENDOSCOPY;  Service: Endoscopy;  Laterality: N/A;  . INGUINAL HERNIA REPAIR    . JOINT REPLACEMENT    . LEFT HEART CATH AND CORS/GRAFTS ANGIOGRAPHY N/A 06/27/2017   Procedure: LEFT HEART CATH AND CORS/GRAFTS ANGIOGRAPHY;  Surgeon: [name], MD;  Location: [place] CV LAB;  Service: Cardiovascular;  Laterality: N/A;  . NASAL SINUS SURGERY    . SHOULDER  OPEN ROTATOR CUFF REPAIR Right   . TOTAL KNEE ARTHROPLASTY Bilateral     There were no vitals filed for this visit.   Subjective Assessment - 10/03/19 1104    Subjective Daughter reports they are picking up brace for Rt foot later today.  Pt reports he has LBP 7-8/10 achey pain.  Daughter reports mainly while standing/walking.    Pertinent History CVA effecting RT side on 07/13/18, lumbar fracture December 2021    Currently in Pain? Yes    Pain Score 8     Pain Location Leg    Pain Orientation Right    Pain Descriptors / Indicators Aching    Pain Type Chronic pain    Pain Onset More than a month ago    Pain Frequency Intermittent    Aggravating Factors  movement    Pain Relieving Factors rest    Effect of Pain on Daily Activities mod  effect                             [facility] Adult PT Treatment/Exercise - 10/03/19 0001      Ambulation/Gait   Ambulation/Gait Yes    Ambulation/Gait Assistance 6: Modified independent (Device/Increase time);5: Supervision    Ambulation Distance (Feet) 226 Feet    Assistive device Large base quad cane    Gait Pattern Step-to pattern;Decreased step length - left;Decreased stride length;Decreased dorsiflexion - right;Ataxic;Decreased step length - right;Decreased stance time - right    Ambulation Surface Level;Indoor    Gait Comments 3 sets; 2 seated rest breaks      Knee/Hip Exercises: Standing   Forward Step Up Both;5 reps;Hand Hold: 2;Step Height: 4"    Functional Squat 2 sets;5 reps    Other Standing Knee Exercises static standing NBOS no HHA, able to stand for 45" prior need for sitting    Other Standing Knee Exercises 4 inch box taps alternating x10 reps      Knee/Hip Exercises: Seated   Other Seated Knee/Hip Exercises seated on dynamic surface wiht no back support marching, BUE paloff with pink ball and UE flexion with MHP on back for pain    Sit to Sand 10 reps      Modalities   Modalities Moist Heat      Moist Heat Therapy   Number Minutes Moist Heat --   During sitting exercises/seated rest breaks   Moist Heat Location Lumbar Spine                    PT Short Term Goals - 09/19/19 0945      PT SHORT TERM GOAL #1   Title Patient will be independent with initial HEP to improve functional outcomes    Baseline Reports compliance    Time 3    Period Weeks    Status Achieved    Target Date 08/02/19      PT SHORT TERM GOAL #2   Title Patient will be able to perform stand x 5 in < 40 seconds to demonstrate improvement in functional mobility and reduced risk for falls.    Baseline current 38.8 sec    Time 3    Period Weeks    Status Achieved    Target Date 08/02/19             PT Long Term Goals - 09/19/19 0946      PT LONG TERM GOAL  #1   Title Patient will be able  to perform stand x 5 in < 25 seconds to demonstrate improvement in functional mobility and reduced risk for falls.    Baseline current 38.8 sec    Time 6    Period Weeks    Status On-going      PT LONG TERM GOAL #2   Title Patient will be able to perform TUG in <15 seconds, using least restrictive assistive device, to demonstrate improvement in functional mobility and reduced risk for falls.    Baseline current 40 sec with RW    Time 6    Period Weeks    Status On-going      PT LONG TERM GOAL #3   Title Patient will have equal to or > 4/5 MMT throughout BLE to improve ability to perform functional mobility, stair ambulation and ADLs.    Baseline See MMT    Time 6    Period Weeks    Status Achieved                 Plan - 10/03/19 1120    Clinical Impression Statement Pt limited wiht LBP with standing/gait this session.  Pt and daughter educated importance of core strengthening to assist with back pain.  Added MHP and dynamic surface seated core strengthening exercises for pain control.  Pt continues to demonstrate decreased activity tolerance with stanidng exercises, able to stand NBOS x 45" prior fatigue required seated rest break.  Pt with posterior lean during static standing required mod assistance and cueing for core engagement to assist. Did demonstrate good Rt foot placement during gait wiht LBQC, min guard/A wiht gait trianig today.  Reports picking up brace later today.    Personal Factors and Comorbidities Age;Comorbidity 2    Comorbidities HX ov CVA, lumbar fracture    Examination-Activity Limitations Bathing;Bed Mobility;Bend;Squat;Stairs;Carry;Stand;Toileting;Dressing;Transfers;Hygiene/Grooming;Lift;Locomotion Level    Examination-Participation Restrictions Community Activity;Other;Cleaning    Stability/Clinical Decision Making Stable/Uncomplicated    Clinical Decision Making Low    Rehab Potential Fair    PT Frequency 2x / week     PT Duration 4 weeks    PT Treatment/Interventions ADLs/Self Care Home Management;Aquatic Therapy;Biofeedback;Cryotherapy;Electrical Stimulation;Iontophoresis 4mg /ml Dexamethasone;Moist Heat;Traction;Balance training;Manual techniques;Therapeutic exercise;Vasopneumatic Device;Wheelchair mobility training;Therapeutic activities;Splinting;Taping;Functional mobility training;Stair training;Orthotic Fit/Training;Gait training;DME Instruction;Patient/family education;Passive range of motion;Dry needling;Energy conservation;Other (comment);Joint Manipulations;Spinal Manipulations;Compression bandaging;Neuromuscular re-education;Cognitive remediation;Ultrasound;Parrafin;Fluidtherapy;Contrast Bath    PT Next Visit Plan Continue to progress LE strength, coordination, and balance with emphasis on gait and functional mobility/ transfers. Continue gait training with [facility] next visit.    PT Home Exercise Plan 07/10/19: seated march, LAQs, seated heel/toe raise; 07/15/19: unsupported sitting 1-2 min rounds; 08/15/19: static standing at sink or counter 09/04/19: seated windsheild wiper, ankle eversion with RTB           Patient will benefit from skilled therapeutic intervention in order to improve the following deficits and impairments:  Abnormal gait, Decreased endurance, Hypomobility, Impaired sensation, Decreased activity tolerance, Decreased strength, Impaired UE functional use, Decreased balance, Decreased mobility, Difficulty walking, Decreased coordination, Decreased safety awareness, Postural dysfunction  Visit Diagnosis: Muscle weakness (generalized)  Other abnormalities of gait and mobility     Problem List Patient Active Problem List   Diagnosis Date Noted  . Sundowning 07/04/2019  . Bilateral hearing loss 04/16/2019  . Urinary frequency 03/21/2019  . Chronic right shoulder pain 12/19/2018  . Weakness generalized 09/28/2018  . Palliative care encounter 09/28/2018  . Pressure injury of skin 08/27/2018   . COPD (chronic obstructive pulmonary disease) ([place]) 08/26/2018  . AMS (altered mental status)  08/22/2018  . Altered mental status 08/21/2018  . UTI (urinary tract infection) 08/21/2018  . Abnormality of gait 08/20/2018  . Prolonged Q-T interval on ECG   . Dysphagia, post-stroke   . Supplemental oxygen dependent   . Anxiety state   . Fatigue   . SOB (shortness of breath)   . Hyperglycemia   . History of lung cancer   . Depression   . CKD (chronic kidney disease), stage III   . Acute blood loss anemia   . Fall 07/16/2018  . 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 Acute spontaneous 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 ([place]) L thalamic 07/14/2018  . Cellulitis of arm, right 06/01/2018  . Rash and nonspecific skin eruption 06/01/2018  . Chest pain 02/02/2018  . Thrombocytopenia ([place]) 02/02/2018  . Hypokalemia 02/02/2018  . Pulmonary embolism ([place]) 02/02/2018  . Elevated troponin 02/02/2018  . Pulmonary embolus ([place]) 02/02/2018  . Non-ST elevation MI (NSTEMI) ([place]) 06/27/2017  . Diastolic CHF ([place]) 16/01/9603  . Chest pain due to CAD ([place]) 06/26/2017  . Dyspnea 06/21/2017  . Acute bronchitis 05/15/2017  . Oral candidiasis 05/15/2017  . Acute pericarditis   . Chest pain at rest 12/20/2016  . Angina pectoris ([place]) 12/20/2016  . Radiation pneumonitis ([place]) 11/09/2016  . Pleurisy 10/11/2016  . Chest wall pain 10/11/2016  . Atrial fibrillation ([place]) 05/21/2016  . Adenocarcinoma of right lung ([place]) 01/06/2016  . GAD (generalized anxiety disorder) 11/20/2015  . Essential hypertension   . Cellulitis 09/04/2015  . Cellulitis of left axilla   . Esophageal ulcer without bleeding   . Bilateral lower extremity edema 04/28/2014  . BPH (benign prostatic hyperplasia) 10/08/2012  . Anxiety   . EKG abnormalities 09/05/2012  . Tremor 09/05/2012  . Chronic fatigue 09/05/2012  . Arthritis   . Asthma, chronic   .  Reflux esophagitis   . S/P CABG (coronary artery bypass graft)   . GERD (gastroesophageal reflux disease)   . Hyperlipidemia   . Hypertension   . UNSPECIFIED PERIPHERAL VASCULAR DISEASE 06/16/2009   Ihor Austin, LPTA/CLT; CBIS [phone]  Aldona Lento 10/03/2019, 1:04 PM  [facility] [address] [place], [place], [address] Phone: [phone]   Fax:  [phone]  Name: Evan [name] MRN: [id number] Date of Birth: 01-20-1928

## 2019-10-08 ENCOUNTER — Encounter (HOSPITAL_COMMUNITY): Payer: Self-pay | Admitting: Occupational Therapy

## 2019-10-08 ENCOUNTER — Other Ambulatory Visit: Payer: Self-pay

## 2019-10-08 ENCOUNTER — Ambulatory Visit (HOSPITAL_COMMUNITY): Payer: Medicare HMO | Admitting: Occupational Therapy

## 2019-10-08 DIAGNOSIS — R29898 Other symptoms and signs involving the musculoskeletal system: Secondary | ICD-10-CM | POA: Diagnosis not present

## 2019-10-08 DIAGNOSIS — R2689 Other abnormalities of gait and mobility: Secondary | ICD-10-CM | POA: Diagnosis not present

## 2019-10-08 DIAGNOSIS — R278 Other lack of coordination: Secondary | ICD-10-CM

## 2019-10-08 DIAGNOSIS — M6281 Muscle weakness (generalized): Secondary | ICD-10-CM | POA: Diagnosis not present

## 2019-10-08 NOTE — Therapy (Signed)
Evan [name], [place], [address] Phone: [phone]   Fax:  [phone]  Occupational Therapy Treatment  Patient Details  Name: Evan [name] MRN: [id number] Date of Birth: 10-06-1927 Referring Provider (OT): [name]  OCCUPATIONAL THERAPY DISCHARGE SUMMARY  Visits from Start of Care: 18  Current functional level related to goals / functional outcomes: Patient's current functional status for BADLs has plateaued   Remaining deficits: Continued weakness and decreased coorindation   Education / Equipment: Hand outs on theraputty, bed mobility, and AE.  Plan: Patient agrees to discharge.  Patient goals were partially met. Patient is being discharged due to lack of progress.  ?????          Encounter Date: 10/08/2019   OT End of Session - 10/08/19 1105    Visit Number 18    Number of Visits 18    Date for OT Re-Evaluation 10/08/19    Authorization Type [employer] H[employer]    Authorization Time Period 8 visits approved (09/12/19-10/11/19) $40 copay    Authorization - Visit Number 8    Authorization - Number of Visits 8    Progress Note Due on Visit 13    OT Start Time 0945    OT Stop Time 1034    OT Time Calculation (min) 49 min    Activity Tolerance Patient tolerated treatment well    Behavior During Therapy WFL for tasks assessed/performed           Past Medical History:  Diagnosis Date  . Adenocarcinoma of right lung ([place]) 01/06/2016  . Anginal chest pain at rest [facility])    Chronic non, controlled on Lorazepam  . Anxiety   . Arthritis    "all over"   . BPH (benign prostatic hyperplasia)   . CAD (coronary artery disease)   . Chronic lower back pain   . Complication of anesthesia    "problems making water afterwards"  . Depression   . DJD (degenerative joint disease)   . Esophageal ulcer without bleeding    bid ppi indefinitely  . Family history of adverse reaction to anesthesia     "children w/PONV"  . GERD (gastroesophageal reflux disease)   . Headache    "couple/week maybe" (09/04/2015)  . Hemochromatosis    Possible, elevated iron stores, hook-like osteophytes on hand films, normal LFTs  . Hepatitis    "yellow jaundice as a baby"  . History of ASCVD    MULTIVESSEL  . Hyperlipidemia   . Hypertension   . Osteoarthritis   . Pneumonia    "several times; got a little now" (09/04/2015)  . Rheumatoid arthritis ([place])   . Subdural hematoma (H[facility])    small after fall 09/2016-plavix held, neurosurgery consulted.  Asymptomatic.    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 ARM SKIN LESION BIOPSY / EXCISION Left 09/02/2015  . CATARACT EXTRACTION W/ INTRAOCULAR LENS  IMPLANT, BILATERAL Bilateral   . CHOLECYSTECTOMY OPEN    . CORNEAL TRANSPLANT Bilateral    "[facility] at [facility]; one at [facility]"  . CORONARY ANGIOPLASTY WITH STENT PLACEMENT    . CORONARY ARTERY BYPASS GRAFT  1999  . DESCENDING AORTIC ANEURYSM REPAIR W/ STENT     DE stent ostium into the right radial free graft at OM-, 02-2007  . ESOPHAGOGASTRODUODENOSCOPY N/A 11/09/2014   Procedure: ESOPHAGOGASTRODUODENOSCOPY (EGD);  Surgeon: [name], MD;  Location: [facility] ENDOSCOPY;  Service: Endoscopy;  Laterality: N/A;  . INGUINAL HERNIA REPAIR    . JOINT REPLACEMENT    . LEFT HEART CATH AND CORS/GRAFTS ANGIOGRAPHY N/A 06/27/2017   Procedure: LEFT HEART CATH AND CORS/GRAFTS ANGIOGRAPHY;  Surgeon: [name], MD;  Location: [place] CV LAB;  Service: Cardiovascular;  Laterality: N/A;  . NASAL SINUS SURGERY    . SHOULDER OPEN ROTATOR CUFF REPAIR Right   . TOTAL KNEE ARTHROPLASTY Bilateral     There were no vitals filed for this visit.   Subjective Assessment - 10/08/19 1047    Subjective  S: We had a cookout on July 4th. It was good.    Patient is accompanied by: Family member   daughter   Pertinent History Patient is a53 y/o  male S/P left CVA sustained 07/13/18. He received home health therapy services (OT, PT,SLP) and would like to continue with outpatient services due to his right hand weakness/uncoordination. [name] has referred patient to occupational therapy for evaluation and treatment.    Pain Score 0-No pain                        OT Treatments/Exercises (OP) - 10/08/19 1049      Bed Mobility   Bed Mobility Rolling Left;Right Sidelying to Sit    Rolling Left Contact Guard/Touching assist    Right Sidelying to Sit Contact Guard/Touching assist      ADLs   Eating Pt performing cutting task with yellow theraputty. Pt using weighted utensils (knife and fork) to cut rolled theraputty. Cues to holding knife in palm of hand. Additional cues for placement of fork near placement of knife. Mod hand over hand for first attempts; transitioning to [employer] A and then [employer] A. 10 reps    UB Dressing Reviewed techniques for UB dressing and ecnouraged continued practice at home.     ADL Comments Daughter asking about techniques for out of bed. Providing education on sequencing of bed mobility using log roll tehcnique to increase patient's participation into technique. Pt performing log roll with Min A. Performing x3. Daughter performing last two reps. Providing handout with sequence written down for increased carry over to home.       Exercises   Exercises Theraputty      Theraputty   Theraputty - Roll Yellow rolling into "worm" for preparation of cutting task. Performing 10 reps.       Neurological Re-education Exercises   Other Exercises 1 At beginning and end of session, pt participating in metronome task. 40 BPM. Completing tapping with RUE, LUE, and then BUE. Performing 1 min each and 2 sets. Performing at Supervision level. Noting pt with increased coordination and decreased fatigue.                  OT Education - 10/08/19 1104    Education Details Providing education and handouts on  metronome exercises, weight utensils to purchase, and bed mobility.    Person(s) Educated Patient;Child(ren)    Methods Explanation;Demonstration    Comprehension Verbalized understanding            OT Short Term Goals - 09/17/19 1142      OT SHORT TERM GOAL #1   Title Patient and family will be educated and verbalize independence with HEP in order to increase functional use of his right hand while using for 25% or more of daily tasks.    Time 6    Period  Weeks    Status Partially met   Target Date 10/08/19      OT SHORT TERM GOAL #2   Title Patient will increase scapular and proximal shoulder strengthening while demonstrating increased shoulder endurance during a sustained functional reaching task for more than 1 minute.    Time 6    Period Weeks    Status Partially met     OT SHORT TERM GOAL #3   Title Patient will increase gross motor coordination by moving 10 or more blocks during the blocks and box test allow him to complete self feeding tasks with less difficulty.    Baseline 6/15: 9 blocks    Time 6    Period Weeks    Status Partially met     OT SHORT TERM GOAL #4   Title Patient will increase his right hand grip strength by 6# and his pinch strength by 3# in order to hold his utensil during meals without dropping or fatigue.    Time 6    Period Weeks    Status Partially met     OT SHORT TERM GOAL #5   Title Patient will be educated on adaptive eqipment/strategies that may help improve overall motor control when completing basic self care tasks.    Time 6    Period Weeks    Status Partially met     Additional Short Term Goals   Additional Short Term Goals Yes      OT SHORT TERM GOAL #6   Title Pt will be demonstrate use of RUE as an assist to increase independence in during ADL completion.    Time 3    Period Weeks    Status Partially met                   Plan - 10/08/19 1106    Clinical Impression Statement A: Initiated and ended session with  metronome activity to continue challenging target reach; performing RUE, LUE, and BUE. Pt demonstrated increased coordination and motor planning with Supervision-Min Guard. Pt also demonstrated increased activity tolerance performing tasks for 1-2 minute (instead of 1 min). Pt performing self-feeding task with simulated cutting task using yellow putty; use of weighted utensils. Daughter reporting that pt has difficulty getting out of bed. Pt practicing supine>sitting using log roll technique. 3 reps with daughter assisting during last two reps. Min cues and [employer] A. Providing handout for bed mobility to break down sequencing and increase carry over to home. Reviewed all education and goals in preparation for dc. Patient and daughter verbalized understanding.    OT Occupational Profile and History Problem Focused Assessment - Including review of records relating to presenting problem    Occupational performance deficits (Please refer to evaluation for details): ADL's;IADL's;Leisure    Body Structure / Function / Physical Skills ADL;UE functional use;FMC;GMC;Coordination;IADL;Dexterity;Strength    Rehab Potential Good    Clinical Decision Making Several treatment options, min-mod task modification necessary    Comorbidities Affecting Occupational Performance: May have comorbidities impacting occupational performance    Plan P: Discharge from therapy.    OT Home Exercise Plan eval: stress ball for grip and pinch strengthening. 5/13: red theraband proximal shoulder strengthening. 7/6 handout on bed mobility, weighted utensils, and metronome exercises.    Consulted and Agree with Plan of Care Patient;Family member/caregiver    Family Member Consulted Daughter; [name]           Patient will benefit from skilled therapeutic intervention in order to improve  the following deficits and impairments:   Body Structure / Function / Physical Skills: ADL, UE functional use, FMC, GMC, Coordination, IADL,  Dexterity, Strength       Visit Diagnosis: Other lack of coordination  Other symptoms and signs involving the musculoskeletal system    Problem List Patient Active Problem List   Diagnosis Date Noted  . Sundowning 07/04/2019  . Bilateral hearing loss 04/16/2019  . Urinary frequency 03/21/2019  . Chronic right shoulder pain 12/19/2018  . Weakness generalized 09/28/2018  . Palliative care encounter 09/28/2018  . Pressure injury of skin 08/27/2018  . COPD (chronic obstructive pulmonary disease) ([place]) 08/26/2018  . AMS (altered mental status) 08/22/2018  . Altered mental status 08/21/2018  . UTI (urinary tract infection) 08/21/2018  . Abnormality of gait 08/20/2018  . Prolonged Q-T interval on ECG   . Dysphagia, post-stroke   . Supplemental oxygen dependent   . Anxiety state   . Fatigue   . SOB (shortness of breath)   . Hyperglycemia   . History of lung cancer   . Depression   . CKD (chronic kidney disease), stage III   . Acute blood loss anemia   . Fall 07/16/2018  . 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 Acute spontaneous 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 ([place]) L thalamic 07/14/2018  . Cellulitis of arm, right 06/01/2018  . Rash and nonspecific skin eruption 06/01/2018  . Chest pain 02/02/2018  . Thrombocytopenia ([place]) 02/02/2018  . Hypokalemia 02/02/2018  . Pulmonary embolism ([place]) 02/02/2018  . Elevated troponin 02/02/2018  . Pulmonary embolus ([place]) 02/02/2018  . Non-ST elevation MI (NSTEMI) ([place]) 06/27/2017  . Diastolic CHF ([place]) 03/25/4113  . Chest pain due to CAD ([place]) 06/26/2017  . Dyspnea 06/21/2017  . Acute bronchitis 05/15/2017  . Oral candidiasis 05/15/2017  . Acute pericarditis   . Chest pain at rest 12/20/2016  . Angina pectoris ([place]) 12/20/2016  . Radiation pneumonitis ([place]) 11/09/2016  . Pleurisy 10/11/2016  . Chest wall pain 10/11/2016  . Atrial fibrillation ([place])  05/21/2016  . Adenocarcinoma of right lung ([place]) 01/06/2016  . GAD (generalized anxiety disorder) 11/20/2015  . Essential hypertension   . Cellulitis 09/04/2015  . Cellulitis of left axilla   . Esophageal ulcer without bleeding   . Bilateral lower extremity edema 04/28/2014  . BPH (benign prostatic hyperplasia) 10/08/2012  . Anxiety   . EKG abnormalities 09/05/2012  . Tremor 09/05/2012  . Chronic fatigue 09/05/2012  . Arthritis   . Asthma, chronic   . Reflux esophagitis   . S/P CABG (coronary artery bypass graft)   . GERD (gastroesophageal reflux disease)   . Hyperlipidemia   . Hypertension   . UNSPECIFIED PERIPHERAL VASCULAR DISEASE 06/16/2009    Neal Dy, MSOT, OTR/L 10/08/2019, 11:14 AM  [facility] [address] [place], [place], [address] Phone: [phone]   Fax:  [phone]  Name: RAY GERVASI MRN: [id number] Date of Birth: Jul 16, 1927

## 2019-10-09 ENCOUNTER — Encounter (HOSPITAL_COMMUNITY): Payer: Self-pay | Admitting: Physical Therapy

## 2019-10-09 ENCOUNTER — Ambulatory Visit (HOSPITAL_COMMUNITY): Payer: Medicare HMO | Admitting: Physical Therapy

## 2019-10-09 DIAGNOSIS — M6281 Muscle weakness (generalized): Secondary | ICD-10-CM

## 2019-10-09 DIAGNOSIS — R2689 Other abnormalities of gait and mobility: Secondary | ICD-10-CM | POA: Diagnosis not present

## 2019-10-09 DIAGNOSIS — R29898 Other symptoms and signs involving the musculoskeletal system: Secondary | ICD-10-CM | POA: Diagnosis not present

## 2019-10-09 DIAGNOSIS — R278 Other lack of coordination: Secondary | ICD-10-CM | POA: Diagnosis not present

## 2019-10-09 NOTE — Therapy (Signed)
[place], [place], [address] Phone: [phone]   Fax:  [phone]  Physical Therapy Treatment  Patient Details  Name: Evan [name] MRN: [id number] Date of Birth: July 30, 1927 Referring Provider (PT): [name] MD    Encounter Date: 10/09/2019   PT End of Session - 10/09/19 1036    Visit Number 24    Number of Visits 28    Date for PT Re-Evaluation 10/18/19    Authorization Type [employer] Medicare    Authorization Time Period approved 8 visits form 6/17--> 10/18/19    Authorization - Visit Number 4    Authorization - Number of Visits 8    Progress Note Due on Visit 28    PT Start Time 1037    PT Stop Time 1113    PT Time Calculation (min) 36 min    Equipment Utilized During Treatment Gait belt    Activity Tolerance Patient limited by fatigue    Behavior During Therapy WFL for tasks assessed/performed           Past Medical History:  Diagnosis Date  . Adenocarcinoma of right lung ([place]) 01/06/2016  . Anginal chest pain at rest [facility])    Chronic non, controlled on Lorazepam  . Anxiety   . Arthritis    "all over"   . BPH (benign prostatic hyperplasia)   . CAD (coronary artery disease)   . Chronic lower back pain   . Complication of anesthesia    "problems making water afterwards"  . Depression   . DJD (degenerative joint disease)   . Esophageal ulcer without bleeding    bid ppi indefinitely  . Family history of adverse reaction to anesthesia    "children w/PONV"  . GERD (gastroesophageal reflux disease)   . Headache    "couple/week maybe" (09/04/2015)  . Hemochromatosis    Possible, elevated iron stores, hook-like osteophytes on hand films, normal LFTs  . Hepatitis    "yellow jaundice as a baby"  . History of ASCVD    MULTIVESSEL  . Hyperlipidemia   . Hypertension   . Osteoarthritis   . Pneumonia    "several times; got a little now" (09/04/2015)  . Rheumatoid arthritis ([place])   . Subdural  hematoma ([facility])    small after fall 09/2016-plavix held, neurosurgery consulted.  Asymptomatic.    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 ARM SKIN LESION BIOPSY / EXCISION Left 09/02/2015  . CATARACT EXTRACTION W/ INTRAOCULAR LENS  IMPLANT, BILATERAL Bilateral   . CHOLECYSTECTOMY OPEN    . CORNEAL TRANSPLANT Bilateral    "one at [facility]; one at [facility]"  . CORONARY ANGIOPLASTY WITH STENT PLACEMENT    . CORONARY ARTERY BYPASS GRAFT  1999  . DESCENDING AORTIC ANEURYSM REPAIR W/ STENT     DE stent ostium into the right radial free graft at OM-, 02-2007  . ESOPHAGOGASTRODUODENOSCOPY N/A 11/09/2014   Procedure: ESOPHAGOGASTRODUODENOSCOPY (EGD);  Surgeon: [name], MD;  Location: [facility] ENDOSCOPY;  Service: Endoscopy;  Laterality: N/A;  . INGUINAL HERNIA REPAIR    . JOINT REPLACEMENT    . LEFT HEART CATH AND CORS/GRAFTS ANGIOGRAPHY N/A 06/27/2017   Procedure: LEFT HEART CATH AND CORS/GRAFTS ANGIOGRAPHY;  Surgeon: [name], MD;  Location: [place] CV LAB;  Service: Cardiovascular;  Laterality: N/A;  . NASAL SINUS SURGERY    . SHOULDER OPEN ROTATOR CUFF  REPAIR Right   . TOTAL KNEE ARTHROPLASTY Bilateral     There were no vitals filed for this visit.   Subjective Assessment - 10/09/19 1041    Subjective Patient presents donning new AFO. Patients daughter says he has not walked much with it yet. Says they are not sure if it fits right. She says he has been walking more with cane.    Pertinent History CVA effecting RT side on 07/13/18, lumbar fracture December 2021    Currently in Pain? No/denies    Pain Onset More than a month ago                             [facility] Adult PT Treatment/Exercise - 10/09/19 0001      Knee/Hip Exercises: Standing   Forward Step Up Both;1 set;5 sets;Hand Hold: 2;Step Height: 4"    Gait Training 120 feet with rest breaks x 3 using LBQC and WC follow        Knee/Hip Exercises: Seated   Sit to Sand 2 sets;5 reps;with UE support                  PT Education - 10/09/19 1213    Education Details on gait training with new AFO, desensitization techniques to RLE for improved comfort of wear, and increased practice with walking using AFO at home    Person(s) Educated Patient;Child(ren)    Methods Explanation    Comprehension Verbalized understanding            PT Short Term Goals - 09/19/19 0945      PT SHORT TERM GOAL #1   Title Patient will be independent with initial HEP to improve functional outcomes    Baseline Reports compliance    Time 3    Period Weeks    Status Achieved    Target Date 08/02/19      PT SHORT TERM GOAL #2   Title Patient will be able to perform stand x 5 in < 40 seconds to demonstrate improvement in functional mobility and reduced risk for falls.    Baseline current 38.8 sec    Time 3    Period Weeks    Status Achieved    Target Date 08/02/19             PT Long Term Goals - 09/19/19 0946      PT LONG TERM GOAL #1   Title Patient will be able to perform stand x 5 in < 25 seconds to demonstrate improvement in functional mobility and reduced risk for falls.    Baseline current 38.8 sec    Time 6    Period Weeks    Status On-going      PT LONG TERM GOAL #2   Title Patient will be able to perform TUG in <15 seconds, using least restrictive assistive device, to demonstrate improvement in functional mobility and reduced risk for falls.    Baseline current 40 sec with RW    Time 6    Period Weeks    Status On-going      PT LONG TERM GOAL #3   Title Patient will have equal to or > 4/5 MMT throughout BLE to improve ability to perform functional mobility, stair ambulation and ADLs.    Baseline See MMT    Time 6    Period Weeks    Status Achieved  Plan - 10/09/19 1214    Clinical Impression Statement Patient limited by complaints of pain with AFO on RLE and  continued complaint of low back pain with ambulation/ standing activity. Patient shows reduced tolerance to activity overall today, and required increased breaks for rest due to pain and verbalized fatigue. Reviewed AFO fit with patient and daughter, no apparent issues with fit or skin irritation noted. Educated patient and daughter on possible desensitization technique employment to reduce discomfort of AFO, and to practice wearing with ambulation around the house to build tolerance. Patient will continue to benefit from skilled therapy services to progress gait training with AFO and LE strengthening to improve functional mobility. Five minutes omitted from treatment time today for patient using restroom.    Personal Factors and Comorbidities Age;Comorbidity 2    Comorbidities HX ov CVA, lumbar fracture    Examination-Activity Limitations Bathing;Bed Mobility;Bend;Squat;Stairs;Carry;Stand;Toileting;Dressing;Transfers;Hygiene/Grooming;Lift;Locomotion Level    Examination-Participation Restrictions Community Activity;Other;Cleaning    Stability/Clinical Decision Making Stable/Uncomplicated    Rehab Potential Fair    PT Frequency 2x / week    PT Duration 4 weeks    PT Treatment/Interventions ADLs/Self Care Home Management;Aquatic Therapy;Biofeedback;Cryotherapy;Electrical Stimulation;Iontophoresis 4mg /ml Dexamethasone;Moist Heat;Traction;Balance training;Manual techniques;Therapeutic exercise;Vasopneumatic Device;Wheelchair mobility training;Therapeutic activities;Splinting;Taping;Functional mobility training;Stair training;Orthotic Fit/Training;Gait training;DME Instruction;Patient/family education;Passive range of motion;Dry needling;Energy conservation;Other (comment);Joint Manipulations;Spinal Manipulations;Compression bandaging;Neuromuscular re-education;Cognitive remediation;Ultrasound;Parrafin;Fluidtherapy;Contrast Bath    PT Next Visit Plan Continue to progress LE strength, coordination, and balance  with emphasis on gait and functional mobility/ transfers. Continue gait training with AFO    PT Home Exercise Plan 07/10/19: seated march, LAQs, seated heel/toe raise; 07/15/19: unsupported sitting 1-2 min rounds; 08/15/19: static standing at sink or counter 09/04/19: seated windsheild wiper, ankle eversion with RTB    Consulted and Agree with Plan of Care Patient;Family member/caregiver    Family Member Consulted Daughter           Patient will benefit from skilled therapeutic intervention in order to improve the following deficits and impairments:  Abnormal gait, Decreased endurance, Hypomobility, Impaired sensation, Decreased activity tolerance, Decreased strength, Impaired UE functional use, Decreased balance, Decreased mobility, Difficulty walking, Decreased coordination, Decreased safety awareness, Postural dysfunction  Visit Diagnosis: Muscle weakness (generalized)  Other abnormalities of gait and mobility     Problem List Patient Active Problem List   Diagnosis Date Noted  . Sundowning 07/04/2019  . Bilateral hearing loss 04/16/2019  . Urinary frequency 03/21/2019  . Chronic right shoulder pain 12/19/2018  . Weakness generalized 09/28/2018  . Palliative care encounter 09/28/2018  . Pressure injury of skin 08/27/2018  . COPD (chronic obstructive pulmonary disease) ([place]) 08/26/2018  . AMS (altered mental status) 08/22/2018  . Altered mental status 08/21/2018  . UTI (urinary tract infection) 08/21/2018  . Abnormality of gait 08/20/2018  . Prolonged Q-T interval on ECG   . Dysphagia, post-stroke   . Supplemental oxygen dependent   . Anxiety state   . Fatigue   . SOB (shortness of breath)   . Hyperglycemia   . History of lung cancer   . Depression   . CKD (chronic kidney disease), stage III   . Acute blood loss anemia   . Fall 07/16/2018  . 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 Acute spontaneous intraparenchymal intracranial  hemorrhage associated with coagulopathy ([place]) L thalamic 07/14/2018  . Cellulitis of arm, right 06/01/2018  . Rash and nonspecific skin eruption 06/01/2018  . Chest pain 02/02/2018  . Thrombocytopenia ([place]) 02/02/2018  . Hypokalemia 02/02/2018  . Pulmonary embolism (  [place]) 02/02/2018  . Elevated troponin 02/02/2018  . Pulmonary embolus ([place]) 02/02/2018  . Non-ST elevation MI (NSTEMI) ([place]) 06/27/2017  . Diastolic CHF ([place]) 16/38/4536  . Chest pain due to CAD ([place]) 06/26/2017  . Dyspnea 06/21/2017  . Acute bronchitis 05/15/2017  . Oral candidiasis 05/15/2017  . Acute pericarditis   . Chest pain at rest 12/20/2016  . Angina pectoris ([place]) 12/20/2016  . Radiation pneumonitis ([place]) 11/09/2016  . Pleurisy 10/11/2016  . Chest wall pain 10/11/2016  . Atrial fibrillation ([place]) 05/21/2016  . Adenocarcinoma of right lung ([place]) 01/06/2016  . GAD (generalized anxiety disorder) 11/20/2015  . Essential hypertension   . Cellulitis 09/04/2015  . Cellulitis of left axilla   . Esophageal ulcer without bleeding   . Bilateral lower extremity edema 04/28/2014  . BPH (benign prostatic hyperplasia) 10/08/2012  . Anxiety   . EKG abnormalities 09/05/2012  . Tremor 09/05/2012  . Chronic fatigue 09/05/2012  . Arthritis   . Asthma, chronic   . Reflux esophagitis   . S/P CABG (coronary artery bypass graft)   . GERD (gastroesophageal reflux disease)   . Hyperlipidemia   . Hypertension   . UNSPECIFIED PERIPHERAL VASCULAR DISEASE 06/16/2009    12:23 PM, 10/09/19 Josue Hector PT DPT  Physical Therapist with [facility]  [phone] [place], [place], [address] Phone: [phone]   Fax:  [phone]  Name: BARTOLO MONTANYE MRN: [id number] Date of Birth: 12-30-1927

## 2019-10-14 ENCOUNTER — Other Ambulatory Visit: Payer: Self-pay

## 2019-10-14 ENCOUNTER — Ambulatory Visit (INDEPENDENT_AMBULATORY_CARE_PROVIDER_SITE_OTHER): Payer: Medicare HMO | Admitting: Family Medicine

## 2019-10-14 VITALS — BP 120/58 | HR 68 | Temp 98.1°F | Ht 68.0 in | Wt 169.0 lb

## 2019-10-14 DIAGNOSIS — R252 Cramp and spasm: Secondary | ICD-10-CM | POA: Diagnosis not present

## 2019-10-14 NOTE — Progress Notes (Signed)
Subjective:    Patient ID: Evan [name], male    DOB: 11-08-1927, 86 y.o.   MRN: [id number]  HPI  08/09/19 Patient is a very pleasant 86 year old Caucasian male who is here today with his daughter who is his primary caregiver.  Patient suffered a hemorrhagic stroke and has right-sided hemiparesis.  He has diminished sensation in his right leg.  He is unable to feel vibration, pain, or cold in his right leg.  He also has ataxia in his right arm and right leg.  He has chronic hemiparesis in the right leg due to the stroke.  Patient is on chronic anticoagulation with Xarelto.  Therefore he is unable to take NSAIDs.  Recently he developed the sudden onset of pain, erythema, and swelling in his left third MCP and left second MCP joint.  They are swollen today on exam, warm to the touch.  Gentle touch of the joint elicits pain.  He is unable to move his hands due to pain.  He appears to have a gout exacerbation.  He occasionally gets gout although he has not had a flare recently.  At that time, my plan was: Given the significance of the pain and swelling and erythema I suspect gout exacerbation.  I recommended a prednisone taper pack.  If this recurs in the future, he can take colchicine 2 tablets immediately at the first sign of gout attack and then 1 tablet 1 hour later if necessary.  Septic arthritis will be less likely.  Osteoarthritis could also be on the differential diagnosis.  He has been trying Voltaren gel with no relief over the last week.  10/14/19 Patient is here for recertification of his oxygen.  After his stroke when he was discharged from the hospital, he required oxygen at home due to hypoxia.  However he has not used oxygen in over 6 months.  Today at rest he is 93 to 94% on room air despite wearing a mask.  He is ambulating less than 50 feet with a cane.  Therefore he does not wear the oxygen with activity.  Today is his usual baseline.  He denies any shortness of breath or chest pain.  His  daughter does report that when he wakes up from a 2-hour nap or when he wakes up first thing in the morning occasionally he will have tremors.  This tends to be more in the right side.  Is also more of a jerking motion.  It will subside with time and after he drinks something warm which I suspect helps to relax him.  It never occurs during the daytime when he is awake. Past Medical History:  Diagnosis Date   Adenocarcinoma of right lung ([place]) 01/06/2016   Anginal chest pain at rest [facility])    Chronic non, controlled on Lorazepam   Anxiety    Arthritis    "all over"    BPH (benign prostatic hyperplasia)    CAD (coronary artery disease)    Chronic lower back pain    Complication of anesthesia    "problems making water afterwards"   Depression    DJD (degenerative joint disease)    Esophageal ulcer without bleeding    bid ppi indefinitely   Family history of adverse reaction to anesthesia    "children w/PONV"   GERD (gastroesophageal reflux disease)    Headache    "couple/week maybe" (09/04/2015)   Hemochromatosis    Possible, elevated iron stores, hook-like osteophytes on hand films, normal LFTs  Hepatitis    "yellow jaundice as a baby"   History of ASCVD    MULTIVESSEL   Hyperlipidemia    Hypertension    Osteoarthritis    Pneumonia    "several times; got a little now" (09/04/2015)   Rheumatoid arthritis ([place])    Subdural hematoma ([place])    small after fall 09/2016-plavix held, neurosurgery consulted.  Asymptomatic.     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[place]) 02/2018   on Lovenox lifelong since he failed PO Eliquis   Past Surgical History:  Procedure Laterality Date   ARM SKIN LESION BIOPSY / EXCISION Left 09/02/2015   CATARACT EXTRACTION W/ INTRAOCULAR LENS  IMPLANT, BILATERAL Bilateral    CHOLECYSTECTOMY OPEN     CORNEAL TRANSPLANT Bilateral    "one at [facility]; one at [facility]"   [place] W/ STENT     DE stent ostium into the right radial free graft at OM-, 02-2007   ESOPHAGOGASTRODUODENOSCOPY N/A 11/09/2014   Procedure: ESOPHAGOGASTRODUODENOSCOPY (EGD);  Surgeon: [name], MD;  Location: [facility] ENDOSCOPY;  Service: Endoscopy;  Laterality: N/A;   INGUINAL HERNIA REPAIR     JOINT REPLACEMENT     LEFT HEART CATH AND CORS/GRAFTS ANGIOGRAPHY N/A 06/27/2017   Procedure: LEFT HEART CATH AND CORS/GRAFTS ANGIOGRAPHY;  Surgeon: [name], MD;  Location: [facility] LAB;  Service: Cardiovascular;  Laterality: N/A;   NASAL SINUS SURGERY     SHOULDER OPEN ROTATOR CUFF REPAIR Right    TOTAL KNEE ARTHROPLASTY Bilateral    Current Outpatient Medications on File Prior to Visit  Medication Sig Dispense Refill   acetaminophen (TYLENOL) 325 MG tablet Take 1-2 tablets (325-650 mg total) by mouth every 4 (four) hours as needed for mild pain.     ALPRAZolam (XANAX) 0.5 MG tablet Take 0.5 tablets (0.25 mg total) by mouth 3 (three) times daily as needed for anxiety. 30 tablet 0   colchicine 0.6 MG tablet TAKE 1 TABLET (0.6 MG TOTAL) BY MOUTH DAILY AS NEEDED. TAKE 2 AT THE FIRST SIGN OF GOUT, THEN 1 MORE PILL ONE HOUR LATER IF STILL HURTING. 30 tablet 0   Control Gel Formula Dressing (DUODERM CGF DRESSING) MISC Apply 1 each topically daily. 12 each 3   diclofenac sodium (VOLTAREN) 1 % GEL Apply 2 g topically 4 (four) times daily. 350 g 1   FLUoxetine (PROZAC) 20 MG capsule TAKE ONE CAPSULE BY MOUTH DAILY 90 capsule 3   furosemide (LASIX) 20 MG tablet TAKE 1 TABLET (20 MG TOTAL) BY MOUTH DAILY. MAY TAKE AND EXTRA 20 MG TABLET IF INCREASED SHORTNESS OF BREATH OR WEIGHT GAIN. 90 tablet 2   hydrOXYzine (ATARAX/VISTARIL) 10 MG tablet Take 1 tablet (10 mg total) by mouth at bedtime. 30 tablet 0   Lifitegrast (XIIDRA) 5 % SOLN Place 1-2 drops into both eyes daily.      magnesium oxide (MAG-OX) 400 (241.3 Mg) MG tablet Take 1 tablet (400 mg total) by mouth  2 (two) times daily. 180 tablet 3   metoprolol tartrate (LOPRESSOR) 25 MG tablet Take 0.5 tablets (12.5 mg total) by mouth 2 (two) times daily. 90 tablet 3   nitroGLYCERIN (NITROSTAT) 0.4 MG SL tablet Place 1 tablet (0.4 mg total) under the tongue every 5 (five) minutes as needed for chest pain. 25 tablet 2   polyethylene glycol (MIRALAX / GLYCOLAX) 17 g packet Take 17 g by mouth daily as needed for  mild constipation. 14 each 0   predniSONE (DELTASONE) 20 MG tablet 3 tabs poqday 1-2, 2 tabs poqday 3-4, 1 tab poqday 5-6 12 tablet 0   Rivaroxaban (XARELTO) 15 MG TABS tablet Take 1 tablet (15 mg total) by mouth daily with supper. 90 tablet 3   rosuvastatin (CRESTOR) 5 MG tablet TAKE 1 TABLET BY MOUTH ONCE DAILY 90 tablet 1   silodosin (RAPAFLO) 8 MG CAPS capsule Take 8 mg by mouth daily with breakfast.     tacrolimus (PROTOPIC) 0.1 % ointment Apply 1 application topically 2 (two) times daily.     No current facility-administered medications on file prior to visit.   Allergies  Allergen Reactions   Feldene [Piroxicam] Other (See Comments)    REACTION: Blisters   Neomycin-Polymyxin-Hc Itching   Statins Other (See Comments)    Myalgias   Doxycycline Other (See Comments)    REACTION:  unknown   Latex Rash   Tequin Anxiety   Valium Other (See Comments)    REACTION:  unknown   Social History   Socioeconomic History   Marital status: Widowed    Spouse name: Not on file   Number of children: Not on file   Years of education: Not on file   Highest education level: Not on file  Occupational History   Not on file  Tobacco Use   Smoking status: Never Smoker   Smokeless tobacco: Former Systems developer    Types: Chew   Tobacco comment: "quit chewing in the 1960s"  Vaping Use   Vaping Use: Never used  Substance and Sexual Activity   Alcohol use: No   Drug use: No   Sexual activity: Never  Other Topics Concern   Not on file  Social History Narrative   01-05-18 Unable  to ask abuse questions family with him today.   11-012-19 Unable to ask abuse questions family with him today.   Social Determinants of Health   Financial Resource Strain:    Difficulty of Paying Living Expenses:   Food Insecurity:    Worried About Charity fundraiser in the Last Year:    Arboriculturist in the Last Year:   Transportation Needs:    Film/video editor (Medical):    Lack of Transportation (Non-Medical):   Physical Activity:    Days of Exercise per Week:    Minutes of Exercise per Session:   Stress:    Feeling of Stress :   Social Connections:    Frequency of Communication with Friends and Family:    Frequency of Social Gatherings with Friends and Family:    Attends Religious Services:    Active Member of Clubs or Organizations:    Attends Music therapist:    Marital Status:   Intimate Partner Violence:    Fear of Current or Ex-Partner:    Emotionally Abused:    Physically Abused:    Sexually Abused:       Review of Systems  All other systems reviewed and are negative.      Objective:   Physical Exam Vitals reviewed.  Constitutional:      Appearance: Normal appearance.  Cardiovascular:     Rate and Rhythm: Normal rate and regular rhythm.  Pulmonary:     Effort: Pulmonary effort is normal.     Breath sounds: Normal breath sounds.  Neurological:     Mental Status: He is alert.   Right-sided hemiparesis with weakness in his right leg.  Weakness with knee flexion  and extension and ankle flexion and extension.  Ataxia in the right upper arm and right lower leg.         Assessment & Plan:  Jerking movements of extremities - Plan: CBC with Differential/Platelet, COMPLETE METABOLIC PANEL WITH GFR, Magnesium  Patient no longer requires oxygen.  Therefore we will discontinue his home oxygen and only recertify if necessary.  I suspect the jerking is either anxiety when he first wakes up or potentially myoclonic  jerking due to hypoxia due to shallow respirations while sleeping.  These attacks seem mild and have gone on now for several years.  Therefore I do not feel that they would benefit from any extensive work-up such as an EEG or overnight pulse oximetry.  Patient would not want to take any antiepileptic to prevent the jerking.  He also does not want to wear the oxygen while sleeping.  Therefore I reassured the patient and his daughter given the chronicity of the symptoms as they have gone on for years.  I will obtain lab work to rule out any electrolyte disturbances.

## 2019-10-15 ENCOUNTER — Ambulatory Visit (HOSPITAL_COMMUNITY): Payer: Medicare HMO

## 2019-10-15 ENCOUNTER — Encounter (HOSPITAL_COMMUNITY): Payer: Self-pay

## 2019-10-15 DIAGNOSIS — R278 Other lack of coordination: Secondary | ICD-10-CM | POA: Diagnosis not present

## 2019-10-15 DIAGNOSIS — M6281 Muscle weakness (generalized): Secondary | ICD-10-CM

## 2019-10-15 DIAGNOSIS — R2689 Other abnormalities of gait and mobility: Secondary | ICD-10-CM | POA: Diagnosis not present

## 2019-10-15 DIAGNOSIS — R29898 Other symptoms and signs involving the musculoskeletal system: Secondary | ICD-10-CM | POA: Diagnosis not present

## 2019-10-15 LAB — CBC WITH DIFFERENTIAL/PLATELET
Absolute Monocytes: 729 cells/uL (ref 200–950)
Basophils Absolute: 11 cells/uL (ref 0–200)
Basophils Relative: 0.2 %
Eosinophils Absolute: 151 cells/uL (ref 15–500)
Eosinophils Relative: 2.8 %
HCT: 35.9 % — ABNORMAL LOW (ref 38.5–50.0)
Hemoglobin: 11.9 g/dL — ABNORMAL LOW (ref 13.2–17.1)
Lymphs Abs: 1447 cells/uL (ref 850–3900)
MCH: 32 pg (ref 27.0–33.0)
MCHC: 33.1 g/dL (ref 32.0–36.0)
MCV: 96.5 fL (ref 80.0–100.0)
MPV: 11.2 fL (ref 7.5–12.5)
Monocytes Relative: 13.5 %
Neutro Abs: 3062 cells/uL (ref 1500–7800)
Neutrophils Relative %: 56.7 %
Platelets: 149 10*3/uL (ref 140–400)
RBC: 3.72 10*6/uL — ABNORMAL LOW (ref 4.20–5.80)
RDW: 12.5 % (ref 11.0–15.0)
Total Lymphocyte: 26.8 %
WBC: 5.4 10*3/uL (ref 3.8–10.8)

## 2019-10-15 LAB — COMPLETE METABOLIC PANEL WITH GFR
AG Ratio: 1.6 (calc) (ref 1.0–2.5)
ALT: 8 U/L — ABNORMAL LOW (ref 9–46)
AST: 14 U/L (ref 10–35)
Albumin: 3.7 g/dL (ref 3.6–5.1)
Alkaline phosphatase (APISO): 109 U/L (ref 35–144)
BUN/Creatinine Ratio: 15 (calc) (ref 6–22)
BUN: 18 mg/dL (ref 7–25)
CO2: 25 mmol/L (ref 20–32)
Calcium: 9.1 mg/dL (ref 8.6–10.3)
Chloride: 103 mmol/L (ref 98–110)
Creat: 1.2 mg/dL — ABNORMAL HIGH (ref 0.70–1.11)
GFR, Est African American: 61 mL/min/{1.73_m2} (ref >=60)
GFR, Est Non African American: 53 mL/min/{1.73_m2} — ABNORMAL LOW (ref >=60)
Globulin: 2.3 g/dL (calc) (ref 1.9–3.7)
Glucose, Bld: 109 mg/dL — ABNORMAL HIGH (ref 65–99)
Potassium: 4 mmol/L (ref 3.5–5.3)
Sodium: 137 mmol/L (ref 135–146)
Total Bilirubin: 0.7 mg/dL (ref 0.2–1.2)
Total Protein: 6 g/dL — ABNORMAL LOW (ref 6.1–8.1)

## 2019-10-15 LAB — MAGNESIUM: Magnesium: 2.1 mg/dL (ref 1.5–2.5)

## 2019-10-15 NOTE — Therapy (Signed)
[place], [place], [address] Phone: [phone]   Fax:  [phone]  Physical Therapy Treatment  Patient Details  Name: Evan [name] MRN: [id number] Date of Birth: 1927/06/07 Referring Provider (PT): [name] MD    Encounter Date: 10/15/2019   PT End of Session - 10/15/19 0949    Visit Number 25    Number of Visits 28    Date for PT Re-Evaluation 10/18/19    Authorization Type [employer]    Authorization Time Period approved 8 visits form 6/17--> 10/18/19    Authorization - Visit Number 5    Authorization - Number of Visits 8    Progress Note Due on Visit 28    PT Start Time 0949    PT Stop Time 1033    PT Time Calculation (min) 44 min    Equipment Utilized During Treatment Gait belt    Activity Tolerance Patient limited by fatigue    Behavior During Therapy [facility] for tasks assessed/performed           Past Medical History:  Diagnosis Date  . Adenocarcinoma of right lung ([place]) 01/06/2016  . Anginal chest pain at rest [facility])    Chronic non, controlled on Lorazepam  . Anxiety   . Arthritis    "all over"   . BPH (benign prostatic hyperplasia)   . CAD (coronary artery disease)   . Chronic lower back pain   . Complication of anesthesia    "problems making water afterwards"  . Depression   . DJD (degenerative joint disease)   . Esophageal ulcer without bleeding    bid ppi indefinitely  . Family history of adverse reaction to anesthesia    "children w/PONV"  . GERD (gastroesophageal reflux disease)   . Headache    "couple/week maybe" (09/04/2015)  . Hemochromatosis    Possible, elevated iron stores, hook-like osteophytes on hand films, normal LFTs  . Hepatitis    "yellow jaundice as a baby"  . History of ASCVD    MULTIVESSEL  . Hyperlipidemia   . Hypertension   . Osteoarthritis   . Pneumonia    "several times; got a little now" (09/04/2015)  . Rheumatoid arthritis ([place])   . Subdural  hematoma ([facility])    small after fall 09/2016-plavix held, neurosurgery consulted.  Asymptomatic.    Marland Kitchen 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[place]) 02/2018   on Lovenox lifelong since he failed PO Eliquis    Past Surgical History:  Procedure Laterality Date  . ARM SKIN LESION BIOPSY / EXCISION Left 09/02/2015  . CATARACT EXTRACTION W/ INTRAOCULAR LENS  IMPLANT, BILATERAL Bilateral   . CHOLECYSTECTOMY OPEN    . CORNEAL TRANSPLANT Bilateral    "one at [facility]; one at [facility]"  . CORONARY ANGIOPLASTY WITH STENT PLACEMENT    . CORONARY ARTERY BYPASS GRAFT  1999  . DESCENDING AORTIC ANEURYSM REPAIR W/ STENT     DE stent ostium into the right radial free graft at OM-, 02-2007  . ESOPHAGOGASTRODUODENOSCOPY N/A 11/09/2014   Procedure: ESOPHAGOGASTRODUODENOSCOPY (EGD);  Surgeon: [name], MD;  Location: [facility] ENDOSCOPY;  Service: Endoscopy;  Laterality: N/A;  . INGUINAL HERNIA REPAIR    . JOINT REPLACEMENT    . LEFT HEART CATH AND CORS/GRAFTS ANGIOGRAPHY N/A 06/27/2017   Procedure: LEFT HEART CATH AND CORS/GRAFTS ANGIOGRAPHY;  Surgeon: [name], MD;  Location: [facility] CV LAB;  Service: Cardiovascular;  Laterality: N/A;  . NASAL SINUS SURGERY    . SHOULDER OPEN ROTATOR CUFF  REPAIR Right   . TOTAL KNEE ARTHROPLASTY Bilateral     There were no vitals filed for this visit.   Subjective Assessment - 10/15/19 0952    Subjective Pt's daughter reports pt is walking better in the boots, but are alternating 1 day with boots and 1 day with brace. Pt reports some "soreness" in RLE, but denies pain.    Pertinent History CVA effecting RT side on 07/13/18, lumbar fracture December 2021    Currently in Pain? No/denies    Pain Onset More than a month ago                [facility] Adult PT Treatment/Exercise - 10/15/19 0001      Ambulation/Gait   Ambulation/Gait Yes    Ambulation/Gait Assistance 4: Min assist    Assistive device Large base quad cane    Gait Pattern Step-to pattern;Decreased step length - left;Decreased stride  length;Decreased dorsiflexion - right;Ataxic;Decreased step length - right;Decreased stance time - right;Narrow base of support    Gait Comments 40' and 28' bouts,  poor R foot progression, cues for widening R step to increase BOS, increased fatigue with ambulation requiring seated rest break to recover      Chief Technology Officer Yes    Wheelchair Assistance 3: Mod assist    Wheelchair Propulsion Both lower extermities;Left upper extremity    Comments heavy assist to engage hamstrings to propel forward, heavy assist to maneuver WC in straight line in open environment      Knee/Hip Exercises: Standing   Other Standing Knee Exercises forward/backward amb in // bars, x2RT with min A, HR 85 max, SPO2 94-96% on RA    Other Standing Knee Exercises static standing, BUE support in // bars, 3x1 minutes, cues for hip/knee extension      Knee/Hip Exercises: Seated   Long Arc Quad Both;5 reps    Long Arc Quad Limitations poor RLE control, good LLE control    Abduction/Adduction  15 reps    Abd/Adduction Limitations 3 sec isometric hold with gait belt around thighs    Sit to Sand 10 reps;with UE support   single UE support, overpressure through R thigh when rising                 PT Education - 10/15/19 1214    Education Details Continue HEP, exercise technique. Educated sister regarding improving pt's endurance with standing for 30-60 sec increments, ambulating throughout home, incorporating WC mobility for endurance training.    Person(s) Educated Patient;Child(ren)    Methods Explanation;Demonstration;Tactile cues;Verbal cues    Comprehension Verbalized understanding;Returned demonstration            PT Short Term Goals - 09/19/19 0945      PT SHORT TERM GOAL #1   Title Patient will be independent with initial HEP to improve functional outcomes    Baseline Reports compliance    Time 3    Period Weeks    Status Achieved    Target Date 08/02/19      PT SHORT  TERM GOAL #2   Title Patient will be able to perform stand x 5 in < 40 seconds to demonstrate improvement in functional mobility and reduced risk for falls.    Baseline current 38.8 sec    Time 3    Period Weeks    Status Achieved    Target Date 08/02/19             PT Long Term Goals -  09/19/19 0946      PT LONG TERM GOAL #1   Title Patient will be able to perform stand x 5 in < 25 seconds to demonstrate improvement in functional mobility and reduced risk for falls.    Baseline current 38.8 sec    Time 6    Period Weeks    Status On-going      PT LONG TERM GOAL #2   Title Patient will be able to perform TUG in <15 seconds, using least restrictive assistive device, to demonstrate improvement in functional mobility and reduced risk for falls.    Baseline current 40 sec with RW    Time 6    Period Weeks    Status On-going      PT LONG TERM GOAL #3   Title Patient will have equal to or > 4/5 MMT throughout BLE to improve ability to perform functional mobility, stair ambulation and ADLs.    Baseline See MMT    Time 6    Period Weeks    Status Achieved                 Plan - 10/15/19 1216    Clinical Impression Statement Continued with strengthening, gait, and endurance training. Pt continues to be limited due to poor endurance requiring therapeutic rest breaks. Added backwards walking in parallel bars for hip extension strengthening and balance training. Pt ambulate with LBQC in hallway with min A, constant cues and assist for R foot placement to widen BOS, cane placement, and sequencing to reduce risk for falls. Pt without AFO this date, wearing high ankle laced boots for stability, demonstrates some R foot inversion and drop foot in swing phase but able to clear appropriately and hit with forefoot/midfoot with initial contact. WC mobility with poor control using bil feet and L UE, heavy assist from therapist to engage in hamstrings pulling pt forward and for maneuvering WC  safely in open environment due to poor steering ability. Educated daughter on endurance training regarding performing exercises, ambulating whenever wanting to get something in the home, standing for short tolerable increments, and engaging in [facility] mobility with assist from family; pt and pt's daughter verbalized understanding. Continue to progress as able.    Personal Factors and Comorbidities Age;Comorbidity 2    Comorbidities HX ov CVA, lumbar fracture    Examination-Activity Limitations Bathing;Bed Mobility;Bend;Squat;Stairs;Carry;Stand;Toileting;Dressing;Transfers;Hygiene/Grooming;Lift;Locomotion Level    Examination-Participation Restrictions Community Activity;Other;Cleaning    Stability/Clinical Decision Making Stable/Uncomplicated    Rehab Potential Fair    PT Frequency 2x / week    PT Duration 4 weeks    PT Treatment/Interventions ADLs/Self Care Home Management;Aquatic Therapy;Biofeedback;Cryotherapy;Electrical Stimulation;Iontophoresis 4mg /ml Dexamethasone;Moist Heat;Traction;Balance training;Manual techniques;Therapeutic exercise;Vasopneumatic Device;Wheelchair mobility training;Therapeutic activities;Splinting;Taping;Functional mobility training;Stair training;Orthotic Fit/Training;Gait training;DME Instruction;Patient/family education;Passive range of motion;Dry needling;Energy conservation;Other (comment);Joint Manipulations;Spinal Manipulations;Compression bandaging;Neuromuscular re-education;Cognitive remediation;Ultrasound;Parrafin;Fluidtherapy;Contrast [place]    PT Next Visit Plan Continue to progress LE strength, coordination, and balance with emphasis on gait and functional mobility/ transfers. Continue gait training with AFO    PT Home Exercise Plan 07/10/19: seated march, LAQs, seated heel/toe raise; 07/15/19: unsupported sitting 1-2 min rounds; 08/15/19: static standing at sink or counter 09/04/19: seated windsheild wiper, ankle eversion with RTB    Consulted and Agree with Plan of Care  Patient;Family member/caregiver    Family Member Consulted Daughter           Patient will benefit from skilled therapeutic intervention in order to improve the following deficits and impairments:  Abnormal gait, Decreased endurance, Hypomobility, Impaired sensation, Decreased  activity tolerance, Decreased strength, Impaired UE functional use, Decreased balance, Decreased mobility, Difficulty walking, Decreased coordination, Decreased safety awareness, Postural dysfunction  Visit Diagnosis: Muscle weakness (generalized)  Other abnormalities of gait and mobility     Problem List Patient Active Problem List   Diagnosis Date Noted  . Sundowning 07/04/2019  . Bilateral hearing loss 04/16/2019  . Urinary frequency 03/21/2019  . Chronic right shoulder pain 12/19/2018  . Weakness generalized 09/28/2018  . Palliative care encounter 09/28/2018  . Pressure injury of skin 08/27/2018  . COPD (chronic obstructive pulmonary disease) ([place]) 08/26/2018  . AMS (altered mental status) 08/22/2018  . Altered mental status 08/21/2018  . UTI (urinary tract infection) 08/21/2018  . Abnormality of gait 08/20/2018  . Prolonged Q-T interval on ECG   . Dysphagia, post-stroke   . Supplemental oxygen dependent   . Anxiety state   . Fatigue   . SOB (shortness of breath)   . Hyperglycemia   . History of lung cancer   . Depression   . CKD (chronic kidney disease), stage III   . Acute blood loss anemia   . Fall 07/16/2018  . 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 Acute spontaneous 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 ([place]) L thalamic 07/14/2018  . Cellulitis of arm, right 06/01/2018  . Rash and nonspecific skin eruption 06/01/2018  . Chest pain 02/02/2018  . Thrombocytopenia ([place]) 02/02/2018  . Hypokalemia 02/02/2018  . Pulmonary embolism ([place]) 02/02/2018  . Elevated troponin 02/02/2018  . Pulmonary embolus ([place]) 02/02/2018   . Non-ST elevation MI (NSTEMI) ([place]) 06/27/2017  . Diastolic CHF ([place]) 37/62/8315  . Chest pain due to CAD ([place]) 06/26/2017  . Dyspnea 06/21/2017  . Acute bronchitis 05/15/2017  . Oral candidiasis 05/15/2017  . Acute pericarditis   . Chest pain at rest 12/20/2016  . Angina pectoris ([place]) 12/20/2016  . Radiation pneumonitis ([place]) 11/09/2016  . Pleurisy 10/11/2016  . Chest wall pain 10/11/2016  . Atrial fibrillation ([place]) 05/21/2016  . Adenocarcinoma of right lung ([place]) 01/06/2016  . GAD (generalized anxiety disorder) 11/20/2015  . Essential hypertension   . Cellulitis 09/04/2015  . Cellulitis of left axilla   . Esophageal ulcer without bleeding   . Bilateral lower extremity edema 04/28/2014  . BPH (benign prostatic hyperplasia) 10/08/2012  . Anxiety   . EKG abnormalities 09/05/2012  . Tremor 09/05/2012  . Chronic fatigue 09/05/2012  . Arthritis   . Asthma, chronic   . Reflux esophagitis   . S/P CABG (coronary artery bypass graft)   . GERD (gastroesophageal reflux disease)   . Hyperlipidemia   . Hypertension   . UNSPECIFIED PERIPHERAL VASCULAR DISEASE 06/16/2009     Talbot Grumbling PT, DPT 10/15/19, 12:27 PM [facility] [address] [place], [place], [address] Phone: [phone]   Fax:  [phone]  Name: JAMEN LOISEAU MRN: [id number] Date of Birth: 21-Jul-1927

## 2019-10-17 ENCOUNTER — Other Ambulatory Visit: Payer: Self-pay

## 2019-10-17 ENCOUNTER — Ambulatory Visit (HOSPITAL_COMMUNITY): Payer: Medicare HMO | Admitting: Physical Therapy

## 2019-10-17 ENCOUNTER — Encounter (HOSPITAL_COMMUNITY): Payer: Self-pay | Admitting: Physical Therapy

## 2019-10-17 DIAGNOSIS — M6281 Muscle weakness (generalized): Secondary | ICD-10-CM

## 2019-10-17 DIAGNOSIS — R29898 Other symptoms and signs involving the musculoskeletal system: Secondary | ICD-10-CM | POA: Diagnosis not present

## 2019-10-17 DIAGNOSIS — R2689 Other abnormalities of gait and mobility: Secondary | ICD-10-CM | POA: Diagnosis not present

## 2019-10-17 DIAGNOSIS — R278 Other lack of coordination: Secondary | ICD-10-CM | POA: Diagnosis not present

## 2019-10-17 NOTE — Therapy (Signed)
[facility] [address] [place], [place], [address] Phone: [phone]   Fax:  [phone]  Physical Therapy Treatment/ Discharge Summary   Patient Details  Name: Evan [name] MRN: [id number] Date of Birth: December 28, 1927 Referring Provider (PT): [name] MD    Encounter Date: 10/17/2019   PHYSICAL THERAPY DISCHARGE SUMMARY  Visits from Start of Care: 26  Current functional level related to goals / functional outcomes: See below    Remaining deficits: See below    Education / Equipment: See assessment  Plan: Patient agrees to discharge.  Patient goals were partially met. Patient is being discharged due to being pleased with the current functional level.  ?????        PT End of Session - 10/17/19 0908    Visit Number 26    Number of Visits 28    Date for PT Re-Evaluation 10/18/19    Authorization Type [employer]    Authorization Time Period approved 8 visits form 6/17--> 10/18/19    Authorization - Visit Number 6    Authorization - Number of Visits 8    Progress Note Due on Visit 28    PT Start Time 0903    PT Stop Time 0941    PT Time Calculation (min) 38 min    Equipment Utilized During Treatment Gait belt    Activity Tolerance Patient tolerated treatment well    Behavior During Therapy WFL for tasks assessed/performed           Past Medical History:  Diagnosis Date  . Adenocarcinoma of right lung ([place]) 01/06/2016  . Anginal chest pain at rest [facility])    Chronic non, controlled on Lorazepam  . Anxiety   . Arthritis    "all over"   . BPH (benign prostatic hyperplasia)   . CAD (coronary artery disease)   . Chronic lower back pain   . Complication of anesthesia    "problems making water afterwards"  . Depression   . DJD (degenerative joint disease)   . Esophageal ulcer without bleeding    bid ppi indefinitely  . Family history of adverse reaction to anesthesia    "children w/PONV"  . GERD  (gastroesophageal reflux disease)   . Headache    "couple/week maybe" (09/04/2015)  . Hemochromatosis    Possible, elevated iron stores, hook-like osteophytes on hand films, normal LFTs  . Hepatitis    "yellow jaundice as a baby"  . History of ASCVD    MULTIVESSEL  . Hyperlipidemia   . Hypertension   . Osteoarthritis   . Pneumonia    "several times; got a little now" (09/04/2015)  . Rheumatoid arthritis ([place])   . Subdural hematoma (H[facility])    small after fall 09/2016-plavix held, neurosurgery consulted.  Asymptomatic.    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 ARM SKIN LESION BIOPSY / EXCISION Left 09/02/2015  . CATARACT EXTRACTION W/ INTRAOCULAR LENS  IMPLANT, BILATERAL Bilateral   . CHOLECYSTECTOMY OPEN    . CORNEAL TRANSPLANT Bilateral    "one at [facility]; one at [facility]"  . CORONARY ANGIOPLASTY WITH STENT PLACEMENT    . CORONARY ARTERY BYPASS GRAFT  1999  . DESCENDING AORTIC ANEURYSM REPAIR W/ STENT     DE stent ostium into the right radial free graft at OM-, 02-2007  . ESOPHAGOGASTRODUODENOSCOPY N/A 11/09/2014   Procedure: ESOPHAGOGASTRODUODENOSCOPY (EGD);  Surgeon: Jenny Reichmann  Amedeo Plenty, MD;  Location: [place];  Service: Endoscopy;  Laterality: N/A;  . INGUINAL HERNIA REPAIR    . JOINT REPLACEMENT    . LEFT HEART CATH AND CORS/GRAFTS ANGIOGRAPHY N/A 06/27/2017   Procedure: LEFT HEART CATH AND CORS/GRAFTS ANGIOGRAPHY;  Surgeon: [name], MD;  Location: [place] CV LAB;  Service: Cardiovascular;  Laterality: N/A;  . NASAL SINUS SURGERY    . SHOULDER OPEN ROTATOR CUFF REPAIR Right   . TOTAL KNEE ARTHROPLASTY Bilateral     There were no vitals filed for this visit.   Subjective Assessment - 10/17/19 0906    Subjective Patient says he's not sure if his AFO is helping him much. His daughter says he is alternating wearing his boots and AFO, says it is keeping his foot straight and "does what  it is supposed to do". She says he is doing much better with walking and getting around the house, as well as noted improvement with transfers in and out of bed and car.    Pertinent History CVA effecting RT side on 07/13/18, lumbar fracture December 2021    Currently in Pain? No/denies    Pain Onset More than a month ago              [facility] PT Assessment - 10/17/19 0001      Assessment   Medical Diagnosis Weakness    Referring Provider (PT) [name] MD     Onset Date/Surgical Date 07/13/18    Hand Dominance Right    Next MD Visit none scheduled    Prior Therapy yes. Home health OT, PT, SLP after CVA      Precautions   Precautions Fall      Restrictions   Weight Bearing Restrictions No      Balance Screen   Has the patient fallen in the past 6 months No      [place] residence    Living Arrangements Children    Available Help at Discharge Family    Type of [place] entrance      Prior Function   Level of Independence Needs assistance with ADLs      Cognition   Overall Cognitive Status Impaired/Different from baseline      Strength   Overall Strength Comments Assessed seated    Right Hip Flexion 4+/5    Right Hip ABduction 4+/5    Left Hip Flexion 4+/5    Left Hip ABduction 4+/5    Right Knee Flexion 4/5    Right Knee Extension 4+/5    Left Knee Flexion 5/5    Left Knee Extension 5/5      Transfers   Five time sit to stand comments  24.5 sec with LUE assist    was 38.8 sec     Ambulation/Gait   Ambulation/Gait Yes    Ambulation/Gait Assistance 6: Modified independent (Device/Increase time);5: Supervision    Ambulation/Gait Assistance Details 130    Assistive device Large base quad cane    Gait Pattern Step-to pattern;Decreased stride length;Decreased dorsiflexion - right;Ataxic;Decreased step length - right;Decreased stance time - right;Narrow base of support   RT AFO   Gait Comments able to  complete 226 feet with single rest break using LBQC      Timed Up and Go Test   TUG Normal TUG    Normal TUG (seconds) 43   40 with RW   TUG Comments --  using [name]                        [facility] Adult PT Treatment/Exercise - 10/17/19 0001      Knee/Hip Exercises: Standing   Gait Training 226 with sinlge rest break using [facility]      Knee/Hip Exercises: Seated   Sit to Sand 3 sets;5 reps;with UE support                  PT Education - 10/17/19 0956    Education Details to continue working on sit to stands and ambulation around the house using quad cane to maintain current level of progress. Also educated on review of seated HEP for BLE strengthening. On transition to DC with HEP    Person(s) Educated Patient;Child(ren)    Methods Explanation    Comprehension Verbalized understanding            PT Short Term Goals - 09/19/19 0945      PT SHORT TERM GOAL #1   Title Patient will be independent with initial HEP to improve functional outcomes    Baseline Reports compliance    Time 3    Period Weeks    Status Achieved    Target Date 08/02/19      PT SHORT TERM GOAL #2   Title Patient will be able to perform stand x 5 in < 40 seconds to demonstrate improvement in functional mobility and reduced risk for falls.    Baseline current 38.8 sec    Time 3    Period Weeks    Status Achieved    Target Date 08/02/19             PT Long Term Goals - 10/17/19 0943      PT LONG TERM GOAL #1   Title Patient will be able to perform stand x 5 in < 25 seconds to demonstrate improvement in functional mobility and reduced risk for falls.    Baseline current 24.5 sec    Time 6    Period Weeks    Status Achieved      PT LONG TERM GOAL #2   Title Patient will be able to perform TUG in <15 seconds, using least restrictive assistive device, to demonstrate improvement in functional mobility and reduced risk for falls.    Baseline current 43 sec with LBQC    Time 6      Period Weeks    Status Not Met      PT LONG TERM GOAL #3   Title Patient will have equal to or > 4/5 MMT throughout BLE to improve ability to perform functional mobility, stair ambulation and ADLs.    Baseline See MMT    Time 6    Period Weeks    Status Achieved                 Plan - 10/17/19 0944    Clinical Impression Statement Patient has made very good progress toward therapy goals. Patient has currently met all long-term goals except TUG test. Patient has significantly improved gait speed and is now ambulating at a similar speed that he was walking using walker with use of quad cane. Patient also showing significant improvement in tolerance to functional activity and ambulation distance, assisted by recent donning of RT AFO. Patient and his daughter educated on progress toward therapy goals and DC to transition to HEP. Patient and daughter encouraged to continue working on sit to  stands and ambulation around the house using quad cane to maintain current level of progress. Also educated on review of seated HEP for BLE strengthening. Patient and daughter verbalized understanding and agree with this plan. Patient being DC from therapy today per max benefit of therapy reached. Patient and daughter instructed to follow up with therapy services with any further questions or concerns.    Personal Factors and Comorbidities Age;Comorbidity 2    Comorbidities HX ov CVA, lumbar fracture    Examination-Activity Limitations Bathing;Bed Mobility;Bend;Squat;Stairs;Carry;Stand;Toileting;Dressing;Transfers;Hygiene/Grooming;Lift;Locomotion Level    Examination-Participation Restrictions Community Activity;Other;Cleaning    Stability/Clinical Decision Making Stable/Uncomplicated    Rehab Potential Fair    PT Frequency --    PT Duration --    PT Treatment/Interventions ADLs/Self Care Home Management;Aquatic Therapy;Biofeedback;Cryotherapy;Electrical Stimulation;Iontophoresis 79m/ml  Dexamethasone;Moist Heat;Traction;Balance training;Manual techniques;Therapeutic exercise;Vasopneumatic Device;Wheelchair mobility training;Therapeutic activities;Splinting;Taping;Functional mobility training;Stair training;Orthotic Fit/Training;Gait training;DME Instruction;Patient/family education;Passive range of motion;Dry needling;Energy conservation;Other (comment);Joint Manipulations;Spinal Manipulations;Compression bandaging;Neuromuscular re-education;Cognitive remediation;Ultrasound;Parrafin;Fluidtherapy;Contrast Bath    PT Next Visit Plan DC to HEP    PT Home Exercise Plan 07/10/19: seated march, LAQs, seated heel/toe raise; 07/15/19: unsupported sitting 1-2 min rounds; 08/15/19: static standing at sink or counter 09/04/19: seated windsheild wiper, ankle eversion with RTB    Consulted and Agree with Plan of Care Patient;Family member/caregiver    Family Member Consulted Daughter           Patient will benefit from skilled therapeutic intervention in order to improve the following deficits and impairments:  Abnormal gait, Decreased endurance, Hypomobility, Impaired sensation, Decreased activity tolerance, Decreased strength, Impaired UE functional use, Decreased balance, Decreased mobility, Difficulty walking, Decreased coordination, Decreased safety awareness, Postural dysfunction  Visit Diagnosis: Muscle weakness (generalized)  Other abnormalities of gait and mobility     Problem List Patient Active Problem List   Diagnosis Date Noted  . Sundowning 07/04/2019  . Bilateral hearing loss 04/16/2019  . Urinary frequency 03/21/2019  . Chronic right shoulder pain 12/19/2018  . Weakness generalized 09/28/2018  . Palliative care encounter 09/28/2018  . Pressure injury of skin 08/27/2018  . COPD (chronic obstructive pulmonary disease) ([place] 08/26/2018  . AMS (altered mental status) 08/22/2018  . Altered mental status 08/21/2018  . UTI (urinary tract infection) 08/21/2018  . Abnormality  of gait 08/20/2018  . Prolonged Q-T interval on ECG   . Dysphagia, post-stroke   . Supplemental oxygen dependent   . Anxiety state   . Fatigue   . SOB (shortness of breath)   . Hyperglycemia   . History of lung cancer   . Depression   . CKD (chronic kidney disease), stage III   . Acute blood loss anemia   . Fall 07/16/2018  . 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 Acute spontaneous 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 ([facility] thalamic 07/14/2018  . Cellulitis of arm, right 06/01/2018  . Rash and nonspecific skin eruption 06/01/2018  . Chest pain 02/02/2018  . Thrombocytopenia ([facility] 02/02/2018  . Hypokalemia 02/02/2018  . Pulmonary embolism ([facility] 02/02/2018  . Elevated troponin 02/02/2018  . Pulmonary embolus ([facility] 02/02/2018  . Non-ST elevation MI (NSTEMI) ([facility] 06/27/2017  . Diastolic CHF ([facility] 085/46/2703 . Chest pain due to CAD ([facility] 06/26/2017  . Dyspnea 06/21/2017  . Acute bronchitis 05/15/2017  . Oral candidiasis 05/15/2017  . Acute pericarditis   . Chest pain at rest 12/20/2016  . Angina pectoris ([facility] 12/20/2016  . Radiation pneumonitis ([facility] 11/09/2016  . Pleurisy 10/11/2016  . Chest wall pain 10/11/2016  .  Atrial fibrillation ([place]) 05/21/2016  . Adenocarcinoma of right lung ([place]) 01/06/2016  . GAD (generalized anxiety disorder) 11/20/2015  . Essential hypertension   . Cellulitis 09/04/2015  . Cellulitis of left axilla   . Esophageal ulcer without bleeding   . Bilateral lower extremity edema 04/28/2014  . BPH (benign prostatic hyperplasia) 10/08/2012  . Anxiety   . EKG abnormalities 09/05/2012  . Tremor 09/05/2012  . Chronic fatigue 09/05/2012  . Arthritis   . Asthma, chronic   . Reflux esophagitis   . S/P CABG (coronary artery bypass graft)   . GERD (gastroesophageal reflux disease)   . Hyperlipidemia   . Hypertension   . UNSPECIFIED PERIPHERAL VASCULAR DISEASE 06/16/2009     10:00 AM, 10/17/19 Josue Hector PT DPT  Physical Therapist with [facility]  (336) [phone] [place] [address] [place], [place], [address] Phone: [phone]   Fax:  [phone]  Name: RAGHAV VERRILLI MRN: [id number] Date of Birth: [birth year]

## 2019-10-22 ENCOUNTER — Ambulatory Visit (HOSPITAL_COMMUNITY): Payer: Medicare HMO | Admitting: Physical Therapy

## 2019-10-24 ENCOUNTER — Ambulatory Visit (HOSPITAL_COMMUNITY): Payer: Medicare HMO

## 2019-10-29 ENCOUNTER — Ambulatory Visit (HOSPITAL_COMMUNITY): Payer: Medicare HMO | Admitting: Physical Therapy

## 2019-10-31 ENCOUNTER — Other Ambulatory Visit: Payer: Self-pay | Admitting: Family Medicine

## 2019-10-31 ENCOUNTER — Ambulatory Visit (HOSPITAL_COMMUNITY): Payer: Medicare HMO | Admitting: Physical Therapy

## 2019-11-01 DIAGNOSIS — R3915 Urgency of urination: Secondary | ICD-10-CM | POA: Diagnosis not present

## 2019-11-08 DIAGNOSIS — R3915 Urgency of urination: Secondary | ICD-10-CM | POA: Diagnosis not present

## 2019-11-11 ENCOUNTER — Other Ambulatory Visit: Payer: Self-pay | Admitting: Family Medicine

## 2019-11-12 ENCOUNTER — Other Ambulatory Visit (HOSPITAL_COMMUNITY): Payer: Self-pay | Admitting: Family Medicine

## 2019-11-14 ENCOUNTER — Ambulatory Visit: Payer: Medicare HMO | Admitting: Pulmonary Disease

## 2019-11-15 DIAGNOSIS — R3915 Urgency of urination: Secondary | ICD-10-CM | POA: Diagnosis not present

## 2019-11-22 DIAGNOSIS — R3915 Urgency of urination: Secondary | ICD-10-CM | POA: Diagnosis not present

## 2019-11-29 DIAGNOSIS — R3915 Urgency of urination: Secondary | ICD-10-CM | POA: Diagnosis not present

## 2019-12-04 ENCOUNTER — Telehealth: Payer: Self-pay

## 2019-12-04 NOTE — Telephone Encounter (Signed)
Spoke with Cherlynn Kaiser from [employer], Evan [name] O2 has been discontinued. His daughter verified as well Evan [name] was no longer using oxygen.

## 2019-12-06 DIAGNOSIS — R3915 Urgency of urination: Secondary | ICD-10-CM | POA: Diagnosis not present

## 2019-12-13 ENCOUNTER — Other Ambulatory Visit: Payer: Self-pay | Admitting: Interventional Cardiology

## 2019-12-13 DIAGNOSIS — R3915 Urgency of urination: Secondary | ICD-10-CM | POA: Diagnosis not present

## 2019-12-13 NOTE — Telephone Encounter (Signed)
25m 76.7kg Scr 1.2 10/14/19 ccr 44.4 Lovw/varanasi 06/25/19

## 2019-12-19 DIAGNOSIS — R3915 Urgency of urination: Secondary | ICD-10-CM | POA: Diagnosis not present

## 2019-12-27 DIAGNOSIS — R3915 Urgency of urination: Secondary | ICD-10-CM | POA: Diagnosis not present

## 2020-01-03 DIAGNOSIS — R3915 Urgency of urination: Secondary | ICD-10-CM | POA: Diagnosis not present

## 2020-01-06 ENCOUNTER — Encounter: Payer: Self-pay | Admitting: Physical Medicine & Rehabilitation

## 2020-01-06 ENCOUNTER — Other Ambulatory Visit: Payer: Self-pay

## 2020-01-06 ENCOUNTER — Encounter: Payer: Medicare HMO | Attending: Physical Medicine & Rehabilitation | Admitting: Physical Medicine & Rehabilitation

## 2020-01-06 VITALS — Ht 64.0 in | Wt 180.0 lb

## 2020-01-06 DIAGNOSIS — I610 Nontraumatic intracerebral hemorrhage in hemisphere, subcortical: Secondary | ICD-10-CM | POA: Diagnosis not present

## 2020-01-06 DIAGNOSIS — F05 Delirium due to known physiological condition: Secondary | ICD-10-CM

## 2020-01-06 DIAGNOSIS — M25511 Pain in right shoulder: Secondary | ICD-10-CM

## 2020-01-06 DIAGNOSIS — R350 Frequency of micturition: Secondary | ICD-10-CM | POA: Diagnosis not present

## 2020-01-06 DIAGNOSIS — G8929 Other chronic pain: Secondary | ICD-10-CM | POA: Diagnosis not present

## 2020-01-06 DIAGNOSIS — H9193 Unspecified hearing loss, bilateral: Secondary | ICD-10-CM | POA: Diagnosis not present

## 2020-01-06 DIAGNOSIS — R269 Unspecified abnormalities of gait and mobility: Secondary | ICD-10-CM | POA: Diagnosis not present

## 2020-01-06 NOTE — Progress Notes (Addendum)
Subjective:    Patient ID: Evan [name], male    DOB: [birth year], 86 y.o.   MRN: [id number]  TELEHEALTH NOTE  Due to national recommendations of social distancing due to COVID 19, an audio/video telehealth visit is felt to be most appropriate for this patient at this time.  See Chart message from today for the patient's consent to telehealth from [place].     I verified that I am speaking with the correct person using two identifiers.  Location of patient: Home Location of provider: Office Method of communication: Telephone Names of participants : Zorita Pang scheduling, Geryl Rankins obtaining consent and vitals if available Established patient Time spent on call: 15 minutes  HPI Male with history of CAD, RA, RLL adenocarcinoma, possible radiation pneumonitis, A fib, DVT/PE--on treatment dose Lovenox, SDH 09/2016, recent admission for +RUE hematoma/cellulitis, chronic thrombocytopenia presents for follow-up for left thalamic hemorrhage.  Daughter provides history due HOH. Last clinic visit on 07/04/19.  Since that time, pt restarted and completed therapies. He is no longer seeing Neurology. Denies falls. He continues to use wheelchair and cane for short distances. He obtained AFO.  He is scheduled to see Urology for freq, he is receiving acupuncture with benefit with urinary freq. Shoulder has improved overall. Cellulitis has resolved. He was treated with gout with improvement in joint swelling.  Sundowning is also improving.    Pain Inventory Average Pain 0 Pain Right Now 0 My pain is aching  In the last 24 hours, has pain interfered with the following? General activity 0 Relation with others 0 Enjoyment of life 0 What TIME of day is your pain at its worst? night Sleep (in general) Good  Pain is worse with: walking and some activites Pain improves with: heat/ice and medication Relief from Meds: 5  Mobility walk with assistance use a  walker ability to climb steps?  no do you drive?  no use a wheelchair needs help with transfers Do you have any goals in this area?  yes  Function retired  Neuro/Psych weakness numbness tremor trouble walking anxiety  Prior Studies Any changes since last visit?  no  Physicians involved in your care Any changes since last visit?  no   Family History  Problem Relation Age of Onset  . Arthritis-Osteo Sister   . Heart attack Brother   . Heart attack Other   . Cancer Neg Hx    Social History   Socioeconomic History  . Marital status: Widowed    Spouse name: Not on file  . Number of children: Not on file  . Years of education: Not on file  . Highest education level: Not on file  Occupational History  . Not on file  Tobacco Use  . Smoking status: Never Smoker  . Smokeless tobacco: Former Systems developer    Types: Chew  . Tobacco comment: "quit chewing in the 1960s"  Vaping Use  . Vaping Use: Never used  Substance and Sexual Activity  . Alcohol use: No  . Drug use: No  . Sexual activity: Never  Other Topics Concern  . Not on file  Social History Narrative   01-05-18 Unable to ask abuse questions family with him today.   11-012-19 Unable to ask abuse questions family with him today.   Social Determinants of Health   Financial Resource Strain:   . Difficulty of Paying Living Expenses: Not on file  Food Insecurity:   . Worried About Charity fundraiser  in the Last Year: Not on file  . Ran Out of Food in the Last Year: Not on file  Transportation Needs:   . Lack of Transportation (Medical): Not on file  . Lack of Transportation (Non-Medical): Not on file  Physical Activity:   . Days of Exercise per Week: Not on file  . Minutes of Exercise per Session: Not on file  Stress:   . Feeling of Stress : Not on file  Social Connections:   . Frequency of Communication with Friends and Family: Not on file  . Frequency of Social Gatherings with Friends and Family: Not on file   . Attends Religious Services: Not on file  . Active Member of Clubs or Organizations: Not on file  . Attends Archivist Meetings: Not on file  . Marital Status: Not on file   Past Surgical History:  Procedure Laterality Date  . ARM SKIN LESION BIOPSY / EXCISION Left 09/02/2015  . CATARACT EXTRACTION W/ INTRAOCULAR LENS  IMPLANT, BILATERAL Bilateral   . CHOLECYSTECTOMY OPEN    . CORNEAL TRANSPLANT Bilateral    "one at [facility]; one at [facility]"  . CORONARY ANGIOPLASTY WITH STENT PLACEMENT    . CORONARY ARTERY BYPASS GRAFT  1999  . DESCENDING AORTIC ANEURYSM REPAIR W/ STENT     DE stent ostium into the right radial free graft at OM-, 02-2007  . ESOPHAGOGASTRODUODENOSCOPY N/A 11/09/2014   Procedure: ESOPHAGOGASTRODUODENOSCOPY (EGD);  Surgeon: [name], MD;  [facility] ENDOSCOPY;  Service: Endoscopy;  Laterality: N/A;  . INGUINAL HERNIA REPAIR    . JOINT REPLACEMENT    . LEFT HEART CATH AND CORS/GRAFTS ANGIOGRAPHY N/A 06/27/2017   Procedure: LEFT HEART CATH AND CORS/GRAFTS ANGIOGRAPHY;  Surgeon: [name], MD;  Location: [place] CV LAB;  Service: Cardiovascular;  Laterality: N/A;  . NASAL SINUS SURGERY    . SHOULDER OPEN ROTATOR CUFF REPAIR Right   . TOTAL KNEE ARTHROPLASTY Bilateral    Past Medical History:  Diagnosis Date  . Adenocarcinoma of right lung ([place]) 01/06/2016  . Anginal chest pain at rest [facility])    Chronic non, controlled on Lorazepam  . Anxiety   . Arthritis    "all over"   . BPH (benign prostatic hyperplasia)   . CAD (coronary artery disease)   . Chronic lower back pain   . Complication of anesthesia    "problems making water afterwards"  . Depression   . DJD (degenerative joint disease)   . Esophageal ulcer without bleeding    bid ppi indefinitely  . Family history of adverse reaction to anesthesia    "children w/PONV"  . GERD (gastroesophageal reflux disease)   . Headache    "couple/week maybe" (09/04/2015)  . Hemochromatosis    Possible,  elevated iron stores, hook-like osteophytes on hand films, normal LFTs  . Hepatitis    "yellow jaundice as a baby"  . History of ASCVD    MULTIVESSEL  . Hyperlipidemia   . Hypertension   . Osteoarthritis   . Pneumonia    "several times; got a little now" (09/04/2015)  . Rheumatoid arthritis ([place])   . Subdural hematoma ([facility])    small after fall 09/2016-plavix held, neurosurgery consulted.  Asymptomatic.    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   on Lovenox lifelong since he failed PO Eliquis   Ht 5\' 4"  (1.626 m)   Wt 180 lb (81.6 kg)   BMI 30.90 kg/m   Opioid Risk Score:   Fall Risk Score:  `1  Depression screen PHQ 2/9  Depression screen [facility] 2/9 07/04/2019 08/20/2018  Decreased Interest 1 0  Down, Depressed, Hopeless 1 2  PHQ - 2 Score 2 2  Altered sleeping - 1  Tired, decreased energy - 2  Change in appetite - 0  Feeling bad or failure about yourself  - 2  Trouble concentrating - 0  Moving slowly or fidgety/restless - 0  Suicidal thoughts - 1  PHQ-9 Score - 8  Difficult doing work/chores - Somewhat difficult  Some recent data might be hidden    Review of Systems  Constitutional: Negative.   HENT: Negative.   Eyes: Negative.   Respiratory: Negative.   Cardiovascular: Negative.   Gastrointestinal: Negative.   Endocrine: Negative.   Genitourinary: Positive for difficulty urinating, dysuria and frequency.  Musculoskeletal: Positive for arthralgias, gait problem and myalgias.  Skin: Positive for rash.  Allergic/Immunologic: Negative.   Neurological: Positive for tremors, weakness and numbness.  Hematological: Bruises/bleeds easily.       Xarelto  Psychiatric/Behavioral: The patient is nervous/anxious.   All other systems reviewed and are negative.     Objective:   Physical Exam  Constitutional: NAD.  Respiratory: Normal effort. Psych: Confused Neurological:  Alert Extreme HOH    Assessment & Plan:  Male with history of CAD, RA, RLL adenocarcinoma, possible  radiation pneumonitis, A fib, DVT/PE--on treatment dose Lovenox, SDH 09/2016, recent admission for RUE hematoma/cellulitis, chronic thrombocytopenia presents for follow-up for left thalamic hemorrhage.  1. 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   Completed therapies, continue HEP  Released by Neuro  2. Gait abnormality  Continue wheelchair for safety, now also using cane for short distances  Continue AFO with improvement  Continue home exercise program  Improving ambulation  3. Urinary frequency  Cont follow up with urology, undergoing acupuncture treatment with improvement  Improving overall  4. Right shoulder pain  Improved after steroid injection  Cont Voltaren gel for shoulder prn  Improved  5. Cellulitis -resolved  Xray personally reviewed, unremarkable for edema, discussed with daughter again  Continue pressure relief  6. Wife describes sundowning   Educated on sleep/wake cycle and environmental modifications   Improving per daughter  31. Gout  2nd MCP joint in left hand  Voltaren gel  Improved after treatment

## 2020-01-10 DIAGNOSIS — R3915 Urgency of urination: Secondary | ICD-10-CM | POA: Diagnosis not present

## 2020-01-15 DIAGNOSIS — L8940 Pressure ulcer of contiguous site of back, buttock and hip, unspecified stage: Secondary | ICD-10-CM | POA: Diagnosis not present

## 2020-01-15 DIAGNOSIS — S0190XA Unspecified open wound of unspecified part of head, initial encounter: Secondary | ICD-10-CM | POA: Diagnosis not present

## 2020-01-15 DIAGNOSIS — L89519 Pressure ulcer of right ankle, unspecified stage: Secondary | ICD-10-CM | POA: Diagnosis not present

## 2020-01-17 DIAGNOSIS — R3915 Urgency of urination: Secondary | ICD-10-CM | POA: Diagnosis not present

## 2020-01-23 ENCOUNTER — Ambulatory Visit: Payer: Medicare HMO | Admitting: Critical Care Medicine

## 2020-01-23 ENCOUNTER — Encounter: Payer: Self-pay | Admitting: Critical Care Medicine

## 2020-01-23 ENCOUNTER — Other Ambulatory Visit: Payer: Self-pay

## 2020-01-23 VITALS — BP 114/60 | HR 56 | Ht 64.0 in | Wt 168.0 lb

## 2020-01-23 DIAGNOSIS — Z85118 Personal history of other malignant neoplasm of bronchus and lung: Secondary | ICD-10-CM | POA: Diagnosis not present

## 2020-01-23 DIAGNOSIS — Z7901 Long term (current) use of anticoagulants: Secondary | ICD-10-CM

## 2020-01-23 DIAGNOSIS — R059 Cough, unspecified: Secondary | ICD-10-CM

## 2020-01-23 DIAGNOSIS — Z23 Encounter for immunization: Secondary | ICD-10-CM

## 2020-01-23 DIAGNOSIS — J701 Chronic and other pulmonary manifestations due to radiation: Secondary | ICD-10-CM

## 2020-01-23 DIAGNOSIS — Z86711 Personal history of pulmonary embolism: Secondary | ICD-10-CM

## 2020-01-23 DIAGNOSIS — R600 Localized edema: Secondary | ICD-10-CM

## 2020-01-23 LAB — BRAIN NATRIURETIC PEPTIDE: Pro B Natriuretic peptide (BNP): 335 pg/mL — ABNORMAL HIGH (ref 0.0–100.0)

## 2020-01-23 NOTE — Progress Notes (Signed)
Synopsis: Referred in 2018 for PE by [name], MD.  Previously a patient of Dr. Lake Bells.  Subjective:   PATIENT ID: Evan [name] GENDER: male DOB: [birth year], MRN: [id number]  Chief Complaint  Patient presents with   Consult    Productive cough with [place] sputum and maybe some blood, HX of lung cancer. Never smoker jsut around it    Evan [name] is a 86 year old gentleman with a history of stage Ia lung adenocarcinoma, rheumatoid arthritis, PE while on DOAC therapy, ICH on Lovenox, atrial fibrillation who presents for follow-up.  He was previously a patient of Dr. Lake Bells.  He is accompanied today by his daughter Evan [name].  His daughter is worried because he has recently been having anxiety attacks that start with him hyperventilating, but he is able to be redirectable and gets over it over about an hour.  These happen early in the morning around 4:57 AM, and then again in the afternoon when he would be waking up from a nap.  The becoming more frequent this week.  He has no history of significant snoring or sleep apnea.  He has had some edema in his left leg, but not in his right leg which is in a brace due to weakness from his previous ICH in 2020.  They are also worried because he has been having [place] sputum that is [place] and pink-tinged.  He is coughing this sputum up about once every hour.  She is unsure if it is foamy or frothy.  No fever, chills, sweats, change in appetite, or significant weight loss recently.  He has previously had dysphagia, but was able to be rehabilitated after his stroke and was cleared by speech for regular diet.  His daughter does not worry about him and says he very infrequently has issues with swallowing, no more than average people.  First rheumatoid arthritis, he is no longer on leflunomide.  He is only taking Tylenol as needed for pain.  He is up-to-date on Covid and pneumonia vaccines.  He has not yet had his seasonal flu vaccine.  H/o submassive PE  while on eliquis for Afib. Then switched to lovenox>> ICH in 2020. Now WC bound due to R-sided weakness.  Continues on Xarelto once daily.  Stage IA lung adenocarcinoama, completed SBRT 02/2016. Never smoker; +chewing tobacco. Had second-hand smoke exposure. 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  Last hospital admission 08/2018 for COPD, although PFTs have not confirmed COPD. Not currently on inhalers.      Past Medical History:  Diagnosis Date   Adenocarcinoma of right lung ([place]) 01/06/2016   Anginal chest pain at rest [facility])    Chronic non, controlled on Lorazepam   Anxiety    Arthritis    "all over"    BPH (benign prostatic hyperplasia)    CAD (coronary artery disease)    Chronic lower back pain    Complication of anesthesia    "problems making water afterwards"   Depression    DJD (degenerative joint disease)    Esophageal ulcer without bleeding    bid ppi indefinitely   Family history of adverse reaction to anesthesia    "children w/PONV"   GERD (gastroesophageal reflux disease)    Headache    "couple/week maybe" (09/04/2015)   Hemochromatosis    Possible, elevated iron stores, hook-like osteophytes on hand films, normal LFTs   Hepatitis    "yellow jaundice as a baby"   History of ASCVD    MULTIVESSEL  Hyperlipidemia    Hypertension    Osteoarthritis    Pneumonia    "several times; got a little now" (09/04/2015)   Rheumatoid arthritis ([place])    Subdural hematoma ([place])    small after fall 09/2016-plavix held, neurosurgery consulted.  Asymptomatic.     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[place]) 02/2018   on Lovenox lifelong since he failed PO Eliquis     Family History  Problem Relation Age of Onset   Arthritis-Osteo Sister    Heart attack Brother    Heart attack Other    Cancer Neg Hx      Past Surgical History:  Procedure Laterality Date   ARM SKIN LESION BIOPSY / EXCISION Left 09/02/2015   CATARACT EXTRACTION W/ INTRAOCULAR LENS   IMPLANT, BILATERAL Bilateral    CHOLECYSTECTOMY OPEN     CORNEAL TRANSPLANT Bilateral    "one at [facility]; one at [facility]"   [place] W/ STENT     DE stent ostium into the right radial free graft at OM-, 02-2007   ESOPHAGOGASTRODUODENOSCOPY N/A 11/09/2014   Procedure: ESOPHAGOGASTRODUODENOSCOPY (EGD);  Surgeon: [name], MD;  Location: [facility] ENDOSCOPY;  Service: Endoscopy;  Laterality: N/A;   INGUINAL HERNIA REPAIR     JOINT REPLACEMENT     LEFT HEART CATH AND CORS/GRAFTS ANGIOGRAPHY N/A 06/27/2017   Procedure: LEFT HEART CATH AND CORS/GRAFTS ANGIOGRAPHY;  Surgeon: [name], MD;  Location: [facility] CV LAB;  Service: Cardiovascular;  Laterality: N/A;   NASAL SINUS SURGERY     SHOULDER OPEN ROTATOR CUFF REPAIR Right    TOTAL KNEE ARTHROPLASTY Bilateral     Social History   Socioeconomic History   Marital status: Widowed    Spouse name: Not on file   Number of children: Not on file   Years of education: Not on file   Highest education level: Not on file  Occupational History   Not on file  Tobacco Use   Smoking status: Passive Smoke Exposure - Never Smoker   Smokeless tobacco: Former Systems developer    Types: Chew   Tobacco comment: "quit chewing in the 1960s"  Vaping Use   Vaping Use: Never used  Substance and Sexual Activity   Alcohol use: No   Drug use: No   Sexual activity: Never  Other Topics Concern   Not on file  Social History Narrative   01-05-18 Unable to ask abuse questions family with him today.   11-012-19 Unable to ask abuse questions family with him today.   Social Determinants of Health   Financial Resource Strain:    Difficulty of Paying Living Expenses: Not on file  Food Insecurity:    Worried About Charity fundraiser in the Last Year: Not on file   [employer] of Food in the Last Year: Not on file  Transportation Needs:    Lack  of Transportation (Medical): Not on file   Lack of Transportation (Non-Medical): Not on file  Physical Activity:    Days of Exercise per Week: Not on file   Minutes of Exercise per Session: Not on file  Stress:    Feeling of Stress : Not on file  Social Connections:    Frequency of Communication with Friends and Family: Not on file   Frequency of Social Gatherings with Friends and Family: Not on file   Attends Religious Services: Not on file   Active Member of Clubs  or Organizations: Not on file   Attends Club or Organization Meetings: Not on file   Marital Status: Not on file  Intimate Partner Violence:    Fear of Current or Ex-Partner: Not on file   Emotionally Abused: Not on file   Physically Abused: Not on file   Sexually Abused: Not on file     Allergies  Allergen Reactions   Feldene [Piroxicam] Other (See Comments)    REACTION: Blisters   Neomycin-Polymyxin-Hc Itching   Statins Other (See Comments)    Myalgias   Doxycycline Other (See Comments)    REACTION:  unknown   Latex Rash   Tequin Anxiety   Valium Other (See Comments)    REACTION:  unknown     Immunization History  Administered Date(s) Administered   Fluad Quad(high Dose 65+) 01/04/2019   Influenza Whole 01/26/2007, 01/10/2008, 12/30/2009   Influenza, High Dose Seasonal PF 12/23/2016, 02/09/2018   Influenza,inj,Quad PF,6+ Mos 12/18/2012, 01/16/2015, 12/29/2015   PFIZER SARS-COV-2 Vaccination 05/10/2019, 06/04/2019   Pneumococcal Conjugate-13 06/24/2014   Pneumococcal Polysaccharide-23 01/25/2000   Td 12/27/2000, 03/09/2010   Tdap 03/09/2010    Outpatient Medications Prior to Visit  Medication Sig Dispense Refill   acetaminophen (TYLENOL) 325 MG tablet Take 1-2 tablets (325-650 mg total) by mouth every 4 (four) hours as needed for mild pain.     ALPRAZolam (XANAX) 0.5 MG tablet Take 0.5 tablets (0.25 mg total) by mouth 3 (three) times daily as needed for anxiety. 30  tablet 0   colchicine 0.6 MG tablet TAKE 1 TABLET (0.6 MG TOTAL) BY MOUTH DAILY AS NEEDED. TAKE 2 AT THE FIRST SIGN OF GOUT, THEN 1 MORE PILL ONE HOUR LATER IF STILL HURTING. 30 tablet 0   Control Gel Formula Dressing (DUODERM CGF DRESSING) MISC Apply 1 each topically daily. 12 each 3   diclofenac sodium (VOLTAREN) 1 % GEL Apply 2 g topically 4 (four) times daily. 350 g 1   FLUoxetine (PROZAC) 20 MG capsule TAKE ONE CAPSULE BY MOUTH DAILY 90 capsule 3   furosemide (LASIX) 20 MG tablet TAKE 1 TABLET (20 MG TOTAL) BY MOUTH DAILY. MAY TAKE AND EXTRA 20 MG TABLET IF INCREASED SHORTNESS OF BREATH OR WEIGHT GAIN. 90 tablet 2   hydrOXYzine (ATARAX/VISTARIL) 10 MG tablet Take 1 tablet (10 mg total) by mouth at bedtime. 30 tablet 0   Lifitegrast (XIIDRA) 5 % SOLN Place 1-2 drops into both eyes daily.      magnesium oxide (MAG-OX) 400 (241.3 Mg) MG tablet Take 1 tablet (400 mg total) by mouth 2 (two) times daily. 180 tablet 3   Magnesium Oxide 400 (240 Mg) MG TABS TAKE 1 TABLET (400 MG TOTAL) BY MOUTH 2 (TWO) TIMES DAILY. 180 tablet 3   metoprolol tartrate (LOPRESSOR) 25 MG tablet Take 0.5 tablets (12.5 mg total) by mouth 2 (two) times daily. 90 tablet 3   nitroGLYCERIN (NITROSTAT) 0.4 MG SL tablet Place 1 tablet (0.4 mg total) under the tongue every 5 (five) minutes as needed for chest pain. 25 tablet 2   pantoprazole (PROTONIX) 40 MG tablet TAKE ONE (1) TABLET BY MOUTH EVERY DAY 30 tablet 3   polyethylene glycol (MIRALAX / GLYCOLAX) 17 g packet Take 17 g by mouth daily as needed for mild constipation. 14 each 0   rosuvastatin (CRESTOR) 5 MG tablet TAKE 1 TABLET BY MOUTH ONCE DAILY 90 tablet 1   silodosin (RAPAFLO) 8 MG CAPS capsule Take 8 mg by mouth daily with breakfast.     tacrolimus (  PROTOPIC) 0.1 % ointment Apply 1 application topically 2 (two) times daily.     XARELTO 15 MG TABS tablet TAKE 1 TABLET (15 MG TOTAL) BY MOUTH DAILY WITH SUPPER. 90 tablet 1   No facility-administered  medications prior to visit.    ROS   Objective:   Vitals:   01/23/20 1036  BP: 114/60  Pulse: (!) 56  SpO2: 98%  Weight: 168 lb (76.2 kg)  Height: 5\' 4"  (1.626 m)   98% on  RA BMI Readings from Last 3 Encounters:  01/23/20 28.84 kg/m  01/06/20 30.90 kg/m  10/14/19 25.70 kg/m   Wt Readings from Last 3 Encounters:  01/23/20 168 lb (76.2 kg)  01/06/20 180 lb (81.6 kg)  10/14/19 169 lb (76.7 kg)    Physical Exam Vitals reviewed.  Constitutional:      Comments: Frail-appearing elderly man sitting in a WC  HENT:     Head: Normocephalic and atraumatic.  Eyes:     General: No scleral icterus. Cardiovascular:     Heart sounds: No murmur heard.      Comments: Bradycardic, reg rhythm Pulmonary:     Comments: CTAB, no conversational dyspnea Abdominal:     General: There is no distension.     Palpations: Abdomen is soft.  Musculoskeletal:     Cervical back: Neck supple.     Comments: LLE edema, RLE in brace, no pretibial edema  Skin:    General: Skin is warm and dry.  Neurological:     Mental Status: He is alert.     Comments: Globally weak, weaker R side.  Psychiatric:        Mood and Affect: Mood normal.        Behavior: Behavior normal.      CBC    Component Value Date/Time   WBC 5.4 10/14/2019 0831   RBC 3.72 (L) 10/14/2019 0831   HGB 11.9 (L) 10/14/2019 0831   HGB 13.4 08/16/2016 1155   HCT 35.9 (L) 10/14/2019 0831   HCT 39.8 08/16/2016 1155   PLT 149 10/14/2019 0831   PLT 81 (L) 08/16/2016 1155   MCV 96.5 10/14/2019 0831   MCV 92.3 08/16/2016 1155   MCH 32.0 10/14/2019 0831   MCHC 33.1 10/14/2019 0831   RDW 12.5 10/14/2019 0831   RDW 13.3 08/16/2016 1155   LYMPHSABS 1,447 10/14/2019 0831   LYMPHSABS 1.3 08/16/2016 1155   MONOABS 1.1 (H) 12/24/2018 1600   MONOABS 1.2 (H) 08/16/2016 1155   EOSABS 151 10/14/2019 0831   EOSABS 0.1 08/16/2016 1155   BASOSABS 11 10/14/2019 0831   BASOSABS 0.0 08/16/2016 1155    CHEMISTRY No results for  input(s): NA, K, CL, CO2, GLUCOSE, BUN, CREATININE, CALCIUM, MG, PHOS in the last 168 hours. CrCl cannot be calculated (Patient's most recent lab result is older than the maximum 21 days allowed.).   Chest Imaging- films reviewed: CT chest with contrast 06/05/2019-right lower lobe opacity with air bronchograms, subpleural reticular changes throughout the right upper lobe not present on all left.  No masses or suspicious nodules.  Pulmonary Functions Testing Results: PFT Results Latest Ref Rng & Units 01/05/2016  FVC-Pre L 3.26  FVC-Predicted Pre % 111  FVC-Post L 3.23  FVC-Predicted Post % 109  Pre FEV1/FVC % % 80  Post FEV1/FCV % % 80  FEV1-Pre L 2.61  FEV1-Predicted Pre % 131  FEV1-Post L 2.58  DLCO uncorrected ml/min/mmHg 11.48  DLCO UNC% % 44  DLCO corrected ml/min/mmHg 11.98  DLCO COR %Predicted %  39  DLVA Predicted % 56  TLC L 5.47  TLC % Predicted % 90  RV % Predicted % 86   2017: No obstruction, mild curvature of flow volume loop. No restriction. Moderate diffusion impairment.  2018 spirometry: Suggests restriction, no obstruction FVC 2.4 (70%) FEV1 2.0 (87%) Ratio 85%   Echocardiogram 8/ 19/2020: LVEF 60 to 11%, normal diastolic function.  Severely dilated LA.  Mildly enlarged RV with mildly reduced systolic function.  Moderately dilated RA, mild to moderate TR.  Compared to January 2020, RV remains enlarged, which was new compared to 02/02/2018 echocardiogram.  RA enlargement was present at that time.   Heart Catheterization 06/27/2017:  Hyperdynamic LV function with a spade-like ventricle and ejection fraction at 60-65%.  Multi vessel native CAD with total occlusion of the very proximal LAD; 75% ostial stenosis of the OM1 vessel with a patent LCX stent between the OM1 and OM 2 vessels, and 65-70% ostial narrowing at the origin of the OM 2 vessel; dominant RCA with 30% proximal and 10% mid stenosis.  Widely patent free radial graft supplying the OM1 vessel with a  patent ostial proximal stent in the graft.  Patent LIMA graft supplying the LAD system.  RECOMMENDATION: Medical therapy.     Assessment & Plan:     ICD-10-CM   1. History of pulmonary embolus (PE)  Z86.711 VAS [place] LOWER EXTREMITY VENOUS (DVT)    B Nat Peptide  2. Chronic anticoagulation  Z79.01 VAS [place] LOWER EXTREMITY VENOUS (DVT)    B Nat Peptide  3. 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 Cough  R05.9 Respiratory or Resp and Sputum Culture    B Nat Peptide  5. History of adenocarcinoma of lung  Z85.118 CT Chest Wo Contrast  6. Leg edema, left  R60.0     History of PE with DOAC failure, but subsequent ICH on lovenox. Now with unilateral left leg edema raising concern for recurrent DVT despite chronic anticoagulation. -left leg [place]; will have to have discussion regarding risks and benefits of AC if he has recurrence on DOAC but previous head bleed on lovenox.   Episodes of SOB that may be orthopnea, sputum production -Checking BNP level -If BNP normal, can try long-acting bronchodilator, despite no evidence of obstrucution but previous concern for small airways disease -Sputum sample requested; no empiric antibiotics right now given lack of systemic symptoms of infection. They will let us know if this changes, in which case empiric antibiotics are indicated. -unless all workup is unrevealing, will defer repeat swallow evaluation at this time given his daughter's confidence that he is not currently having dysphagia.  History of stage IA lung adenocarcinoma treated with radiation, subsequent radiation fibrosis -repeat CT scan -flu shot today   I will follow up with him on all currently ordered tests. RTC in 6 months with Dr. Silas Flood or sooner as needed.   >45 minutes spent on this encounter, including >50% spent face to face with the patient and his daughter.   Current Outpatient Medications:    acetaminophen (TYLENOL) 325 MG tablet, Take 1-2  tablets (325-650 mg total) by mouth every 4 (four) hours as needed for mild pain., Disp: , Rfl:    ALPRAZolam (XANAX) 0.5 MG tablet, Take 0.5 tablets (0.25 mg total) by mouth 3 (three) times daily as needed for anxiety., Disp: 30 tablet, Rfl: 0   colchicine 0.6 MG tablet, TAKE 1 TABLET (0.6 MG TOTAL) BY MOUTH DAILY AS NEEDED. TAKE  2 AT THE FIRST SIGN OF GOUT, THEN 1 MORE PILL ONE HOUR LATER IF STILL HURTING., Disp: 30 tablet, Rfl: 0   Control Gel Formula Dressing (DUODERM CGF DRESSING) MISC, Apply 1 each topically daily., Disp: 12 each, Rfl: 3   diclofenac sodium (VOLTAREN) 1 % GEL, Apply 2 g topically 4 (four) times daily., Disp: 350 g, Rfl: 1   FLUoxetine (PROZAC) 20 MG capsule, TAKE ONE CAPSULE BY MOUTH DAILY, Disp: 90 capsule, Rfl: 3   furosemide (LASIX) 20 MG tablet, TAKE 1 TABLET (20 MG TOTAL) BY MOUTH DAILY. MAY TAKE AND EXTRA 20 MG TABLET IF INCREASED SHORTNESS OF BREATH OR WEIGHT GAIN., Disp: 90 tablet, Rfl: 2   hydrOXYzine (ATARAX/VISTARIL) 10 MG tablet, Take 1 tablet (10 mg total) by mouth at bedtime., Disp: 30 tablet, Rfl: 0   Lifitegrast (XIIDRA) 5 % SOLN, Place 1-2 drops into both eyes daily. , Disp: , Rfl:    magnesium oxide (MAG-OX) 400 (241.3 Mg) MG tablet, Take 1 tablet (400 mg total) by mouth 2 (two) times daily., Disp: 180 tablet, Rfl: 3   Magnesium Oxide 400 (240 Mg) MG TABS, TAKE 1 TABLET (400 MG TOTAL) BY MOUTH 2 (TWO) TIMES DAILY., Disp: 180 tablet, Rfl: 3   metoprolol tartrate (LOPRESSOR) 25 MG tablet, Take 0.5 tablets (12.5 mg total) by mouth 2 (two) times daily., Disp: 90 tablet, Rfl: 3   nitroGLYCERIN (NITROSTAT) 0.4 MG SL tablet, Place 1 tablet (0.4 mg total) under the tongue every 5 (five) minutes as needed for chest pain., Disp: 25 tablet, Rfl: 2   pantoprazole (PROTONIX) 40 MG tablet, TAKE ONE (1) TABLET BY MOUTH EVERY DAY, Disp: 30 tablet, Rfl: 3   polyethylene glycol (MIRALAX / GLYCOLAX) 17 g packet, Take 17 g by mouth daily as needed for mild  constipation., Disp: 14 each, Rfl: 0   rosuvastatin (CRESTOR) 5 MG tablet, TAKE 1 TABLET BY MOUTH ONCE DAILY, Disp: 90 tablet, Rfl: 1   silodosin (RAPAFLO) 8 MG CAPS capsule, Take 8 mg by mouth daily with breakfast., Disp: , Rfl:    tacrolimus (PROTOPIC) 0.1 % ointment, Apply 1 application topically 2 (two) times daily., Disp: , Rfl:    XARELTO 15 MG TABS tablet, TAKE 1 TABLET (15 MG TOTAL) BY MOUTH DAILY WITH SUPPER., Disp: 90 tablet, Rfl: 1      Julian Hy, DO [facility] 01/23/2020 11:21 AM

## 2020-01-23 NOTE — Patient Instructions (Addendum)
Thank you for visiting Dr. Carlis Abbott at [facility]. We recommend the following: Orders Placed This Encounter  Procedures  . Respiratory or Resp and Sputum Culture  . CT Chest Wo Contrast  . B Nat Peptide  . VAS [place] LOWER EXTREMITY VENOUS (DVT)   Orders Placed This Encounter  Procedures  . Respiratory or Resp and Sputum Culture    Standing Status:   Future    Standing Expiration Date:   01/22/2021  . CT Chest Wo Contrast    Standing Status:   Future    Standing Expiration Date:   01/22/2021    Order Specific Question:   Preferred imaging location?    Answer:   [place] [facility]    Standing Status:   Future    Standing Expiration Date:   01/22/2021    No orders of the defined types were placed in this encounter.   Return in about 6 months (around 07/23/2020). with Dr. Silas Flood (30 minutes visit).    Please do your part to reduce the spread of COVID-19.

## 2020-01-27 ENCOUNTER — Ambulatory Visit (HOSPITAL_COMMUNITY)
Admission: RE | Admit: 2020-01-27 | Discharge: 2020-01-27 | Disposition: A | Discharge status: Home / Self Care | Payer: Medicare HMO | Source: Ambulatory Visit | Attending: Cardiology | Admitting: Cardiology

## 2020-01-27 ENCOUNTER — Other Ambulatory Visit: Payer: Self-pay

## 2020-01-27 DIAGNOSIS — Z7901 Long term (current) use of anticoagulants: Secondary | ICD-10-CM | POA: Insufficient documentation

## 2020-01-27 DIAGNOSIS — Z86711 Personal history of pulmonary embolism: Secondary | ICD-10-CM

## 2020-01-29 ENCOUNTER — Other Ambulatory Visit: Payer: Medicare HMO

## 2020-01-29 NOTE — Progress Notes (Signed)
Can you please let Evan [name] daughter know that he does not have a DVT in his leg, so I worry that the shortness of breath episodes may be related to him having too much fluid. I recommend taking an extra dose of lasix for 3 days in a row to see if that helps anything. I think this could be due to him having extra fluid he needs to get rid of. We will follow up after he has his CT scan next week. Thanks!  LPC

## 2020-02-03 ENCOUNTER — Ambulatory Visit (HOSPITAL_COMMUNITY)
Admission: RE | Admit: 2020-02-03 | Discharge: 2020-02-03 | Disposition: A | Discharge status: Home / Self Care | Payer: Medicare HMO | Source: Ambulatory Visit | Attending: Critical Care Medicine | Admitting: Critical Care Medicine

## 2020-02-03 ENCOUNTER — Other Ambulatory Visit: Payer: Self-pay

## 2020-02-03 DIAGNOSIS — Z85118 Personal history of other malignant neoplasm of bronchus and lung: Secondary | ICD-10-CM

## 2020-02-03 DIAGNOSIS — J439 Emphysema, unspecified: Secondary | ICD-10-CM | POA: Diagnosis not present

## 2020-02-03 DIAGNOSIS — J181 Lobar pneumonia, unspecified organism: Secondary | ICD-10-CM | POA: Diagnosis not present

## 2020-02-03 DIAGNOSIS — J701 Chronic and other pulmonary manifestations due to radiation: Secondary | ICD-10-CM | POA: Diagnosis not present

## 2020-02-03 DIAGNOSIS — J8410 Pulmonary fibrosis, unspecified: Secondary | ICD-10-CM | POA: Diagnosis not present

## 2020-02-05 ENCOUNTER — Other Ambulatory Visit: Payer: Medicare HMO

## 2020-02-05 ENCOUNTER — Other Ambulatory Visit (HOSPITAL_COMMUNITY): Payer: Self-pay | Admitting: Internal Medicine

## 2020-02-05 ENCOUNTER — Ambulatory Visit: Payer: Medicare HMO | Attending: Internal Medicine

## 2020-02-05 DIAGNOSIS — R059 Cough, unspecified: Secondary | ICD-10-CM | POA: Diagnosis not present

## 2020-02-05 DIAGNOSIS — Z23 Encounter for immunization: Secondary | ICD-10-CM

## 2020-02-05 NOTE — Progress Notes (Signed)
   Covid-19 Vaccination Clinic  Name:  Evan [name]    MRN: [id number] DOB: 1927-08-24  02/05/2020  Evan [name] was observed post Covid-19 immunization for 15 minutes without incident. He was provided with Vaccine Information Sheet and instruction to access the V-Safe system.   Evan [name] was instructed to call 911 with any severe reactions post vaccine: Marland Kitchen Difficulty breathing  . Swelling of face and throat  . A fast heartbeat  . A bad rash all over body  . Dizziness and weakness

## 2020-02-07 ENCOUNTER — Telehealth: Payer: Self-pay | Admitting: Critical Care Medicine

## 2020-02-07 DIAGNOSIS — R3915 Urgency of urination: Secondary | ICD-10-CM | POA: Diagnosis not present

## 2020-02-07 LAB — RESPIRATORY CULTURE OR RESPIRATORY AND SPUTUM CULTURE: MICRO NUMBER:: 11154993

## 2020-02-07 NOTE — Telephone Encounter (Signed)
Attemtped to contact daughter [name] to discuss CT results- stable radiation fibrosis in RLL. Bronchiectatic airways into this segment may explain his symptoms. Previous US did not demonstrate DVT.  Increased furosemide three days - cleared up ankle edema, no longer coughing up mucus. Recently coughing up again with milder ankle edema. Very SOB overnight, more coughing. Gave double lasix this morning- can't tell a difference yet.   Recommend taking higher dose of Lasix for the next 2 days.  Then you should switch to taking higher dose on Monday Wednesday Friday (starting next Wednesday).  He needs to follow-up with Dr. Milinda Antis at least for labs in the next 2 weeks, which his daughter will set up.  If he gets worse in the meantime he needs to be seen at urgent care or the emergency department.  She expressed understanding.  Julian Hy, DO 02/07/20 11:17 AM [facility]

## 2020-02-25 DIAGNOSIS — R3915 Urgency of urination: Secondary | ICD-10-CM | POA: Diagnosis not present

## 2020-02-25 DIAGNOSIS — R351 Nocturia: Secondary | ICD-10-CM | POA: Diagnosis not present

## 2020-02-25 DIAGNOSIS — N403 Nodular prostate with lower urinary tract symptoms: Secondary | ICD-10-CM | POA: Diagnosis not present

## 2020-03-05 ENCOUNTER — Other Ambulatory Visit: Payer: Self-pay | Admitting: Family Medicine

## 2020-03-06 DIAGNOSIS — R3915 Urgency of urination: Secondary | ICD-10-CM | POA: Diagnosis not present

## 2020-03-11 ENCOUNTER — Other Ambulatory Visit: Payer: Self-pay | Admitting: Family Medicine

## 2020-03-13 ENCOUNTER — Other Ambulatory Visit: Payer: Self-pay

## 2020-03-13 ENCOUNTER — Ambulatory Visit (INDEPENDENT_AMBULATORY_CARE_PROVIDER_SITE_OTHER): Payer: Medicare HMO | Admitting: Family Medicine

## 2020-03-13 VITALS — BP 120/80 | HR 66 | Temp 97.4°F | Ht 64.0 in | Wt 165.0 lb

## 2020-03-13 DIAGNOSIS — I69351 Hemiplegia and hemiparesis following cerebral infarction affecting right dominant side: Secondary | ICD-10-CM | POA: Diagnosis not present

## 2020-03-13 DIAGNOSIS — I610 Nontraumatic intracerebral hemorrhage in hemisphere, subcortical: Secondary | ICD-10-CM

## 2020-03-13 DIAGNOSIS — N183 Chronic kidney disease, stage 3 unspecified: Secondary | ICD-10-CM

## 2020-03-13 DIAGNOSIS — Z85118 Personal history of other malignant neoplasm of bronchus and lung: Secondary | ICD-10-CM | POA: Diagnosis not present

## 2020-03-13 DIAGNOSIS — I2510 Atherosclerotic heart disease of native coronary artery without angina pectoris: Secondary | ICD-10-CM | POA: Diagnosis not present

## 2020-03-13 LAB — CBC WITH DIFFERENTIAL/PLATELET
Absolute Monocytes: 828 cells/uL (ref 200–950)
Basophils Absolute: 21 cells/uL (ref 0–200)
Basophils Relative: 0.3 %
Eosinophils Absolute: 69 cells/uL (ref 15–500)
Eosinophils Relative: 1 %
HCT: 33.2 % — ABNORMAL LOW (ref 38.5–50.0)
Hemoglobin: 11.1 g/dL — ABNORMAL LOW (ref 13.2–17.1)
Lymphs Abs: 1670 cells/uL (ref 850–3900)
MCH: 31.7 pg (ref 27.0–33.0)
MCHC: 33.4 g/dL (ref 32.0–36.0)
MCV: 94.9 fL (ref 80.0–100.0)
MPV: 11.3 fL (ref 7.5–12.5)
Monocytes Relative: 12 %
Neutro Abs: 4313 cells/uL (ref 1500–7800)
Neutrophils Relative %: 62.5 %
Platelets: 131 10*3/uL — ABNORMAL LOW (ref 140–400)
RBC: 3.5 10*6/uL — ABNORMAL LOW (ref 4.20–5.80)
RDW: 12.6 % (ref 11.0–15.0)
Total Lymphocyte: 24.2 %
WBC: 6.9 10*3/uL (ref 3.8–10.8)

## 2020-03-13 LAB — COMPLETE METABOLIC PANEL WITH GFR
AG Ratio: 1.7 (calc) (ref 1.0–2.5)
ALT: 6 U/L — ABNORMAL LOW (ref 9–46)
AST: 12 U/L (ref 10–35)
Albumin: 3.9 g/dL (ref 3.6–5.1)
Alkaline phosphatase (APISO): 114 U/L (ref 35–144)
BUN/Creatinine Ratio: 23 (calc) — ABNORMAL HIGH (ref 6–22)
BUN: 30 mg/dL — ABNORMAL HIGH (ref 7–25)
CO2: 23 mmol/L (ref 20–32)
Calcium: 9.2 mg/dL (ref 8.6–10.3)
Chloride: 105 mmol/L (ref 98–110)
Creat: 1.28 mg/dL — ABNORMAL HIGH (ref 0.70–1.11)
GFR, Est African American: 57 mL/min/{1.73_m2} — ABNORMAL LOW (ref >=60)
GFR, Est Non African American: 49 mL/min/{1.73_m2} — ABNORMAL LOW (ref >=60)
Globulin: 2.3 g/dL (calc) (ref 1.9–3.7)
Glucose, Bld: 110 mg/dL — ABNORMAL HIGH (ref 65–99)
Potassium: 4.1 mmol/L (ref 3.5–5.3)
Sodium: 137 mmol/L (ref 135–146)
Total Bilirubin: 0.8 mg/dL (ref 0.2–1.2)
Total Protein: 6.2 g/dL (ref 6.1–8.1)

## 2020-03-13 NOTE — Progress Notes (Signed)
Subjective:    Patient ID: Evan [name], Evan [name]    DOB: 09-04-1927, 86 y.o.   MRN: [id number]  HPI  08/09/19 Patient is a very pleasant 86 year old Caucasian Evan [name] who is here today with his daughter who is his primary caregiver.  Patient suffered a hemorrhagic stroke and has right-sided hemiparesis.  He has diminished sensation in his right leg.  He is unable to feel vibration, pain, or cold in his right leg.  He also has ataxia in his right arm and right leg.  He has chronic hemiparesis in the right leg due to the stroke.  Patient is on chronic anticoagulation with Xarelto.  Therefore he is unable to take NSAIDs.  Recently he developed the sudden onset of pain, erythema, and swelling in his left third MCP and left second MCP joint.  They are swollen today on exam, warm to the touch.  Gentle touch of the joint elicits pain.  He is unable to move his hands due to pain.  He appears to have a gout exacerbation.  He occasionally gets gout although he has not had a flare recently.  At that time, my plan was: Given the significance of the pain and swelling and erythema I suspect gout exacerbation.  I recommended a prednisone taper pack.  If this recurs in the future, he can take colchicine 2 tablets immediately at the first sign of gout attack and then 1 tablet 1 hour later if necessary.  Septic arthritis will be less likely.  Osteoarthritis could also be on the differential diagnosis.  He has been trying Voltaren gel with no relief over the last week.  10/14/19 Patient is here for recertification of his oxygen.  After his stroke when he was discharged from the hospital, he required oxygen at home due to hypoxia.  However he has not used oxygen in over 6 months.  Today at rest he is 93 to 94% on room air despite wearing a mask.  He is ambulating less than 50 feet with a cane.  Therefore he does not wear the oxygen with activity.  Today is his usual baseline.  He denies any shortness of breath or chest pain.  His  daughter does report that when he wakes up from a 2-hour nap or when he wakes up first thing in the morning occasionally he will have tremors.  This tends to be more in the right side.  Is also more of a jerking motion.  It will subside with time and after he drinks something warm which I suspect helps to relax him.  It never occurs during the daytime when he is awake.  03/13/20 Patient is here today for a checkup.  Recently saw pulmonology who increased his Lasix from 20 mg every day to 20 mg every day and an additional 20 mg on Monday Wednesday Friday.  His lungs are clear to auscultation bilaterally today.  There is no pitting edema in his extremities.  He denies any shortness of breath or chest pain.  He does report burning and irritation in both eyes due to chronic dry.  Otherwise he is doing well.  He denies any chest pain.  He denies any abdominal pain or nausea or vomiting.  He denies any constipation.  His daughter states that his appetite has declined.  His weight is down 4 pounds from the summer.  His family does a tremendous job caring for him.  He is well-nourished and well-groomed.  Unfortunately he spends majority of his day  confined to a chair due to the weakness in his right side from his previous hemorrhagic stroke.  He has had his flu shot.  He is had his Covid shot.  He is still taking Xarelto 15 mg a day due to his history of atrial fibrillation Past Medical History:  Diagnosis Date  . Adenocarcinoma of right lung ([place]) 01/06/2016  . Anginal chest pain at rest [facility])    Chronic non, controlled on Lorazepam  . Anxiety   . Arthritis    "all over"   . BPH (benign prostatic hyperplasia)   . CAD (coronary artery disease)   . Chronic lower back pain   . Complication of anesthesia    "problems making water afterwards"  . Depression   . DJD (degenerative joint disease)   . Esophageal ulcer without bleeding    bid ppi indefinitely  . Family history of adverse reaction to anesthesia     "children w/PONV"  . GERD (gastroesophageal reflux disease)   . Headache    "couple/week maybe" (09/04/2015)  . Hemochromatosis    Possible, elevated iron stores, hook-like osteophytes on hand films, normal LFTs  . Hepatitis    "yellow jaundice as a baby"  . History of ASCVD    MULTIVESSEL  . Hyperlipidemia   . Hypertension   . Osteoarthritis   . Pneumonia    "several times; got a little now" (09/04/2015)  . Rheumatoid arthritis ([place])   . Subdural hematoma (HCC)    small after fall 09/2016-plavix held, neurosurgery consulted.  Asymptomatic.    Marland Kitchen 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[place]) 02/2018   on Lovenox lifelong since he failed PO Eliquis   Past Surgical History:  Procedure Laterality Date  . ARM SKIN LESION BIOPSY / EXCISION Left 09/02/2015  . CATARACT EXTRACTION W/ INTRAOCULAR LENS  IMPLANT, BILATERAL Bilateral   . CHOLECYSTECTOMY OPEN    . CORNEAL TRANSPLANT Bilateral    "one at [facility]; one at [facility]"  . CORONARY ANGIOPLASTY WITH STENT PLACEMENT    . CORONARY ARTERY BYPASS GRAFT  1999  . DESCENDING AORTIC ANEURYSM REPAIR W/ STENT     DE stent ostium into the right radial free graft at OM-, 02-2007  . ESOPHAGOGASTRODUODENOSCOPY N/A 11/09/2014   Procedure: ESOPHAGOGASTRODUODENOSCOPY (EGD);  Surgeon: [name], MD;  Location: [facility] ENDOSCOPY;  Service: Endoscopy;  Laterality: N/A;  . INGUINAL HERNIA REPAIR    . JOINT REPLACEMENT    . LEFT HEART CATH AND CORS/GRAFTS ANGIOGRAPHY N/A 06/27/2017   Procedure: LEFT HEART CATH AND CORS/GRAFTS ANGIOGRAPHY;  Surgeon: [name], MD;  Location: [place] CV LAB;  Service: Cardiovascular;  Laterality: N/A;  . NASAL SINUS SURGERY    . SHOULDER OPEN ROTATOR CUFF REPAIR Right   . TOTAL KNEE ARTHROPLASTY Bilateral    Current Outpatient Medications on File Prior to Visit  Medication Sig Dispense Refill  . acetaminophen (TYLENOL) 325 MG tablet Take 1-2 tablets (325-650 mg total) by mouth every 4 (four) hours as needed for mild pain.    Marland Kitchen ALPRAZolam (XANAX) 0.5  MG tablet Take 0.5 tablets (0.25 mg total) by mouth 3 (three) times daily as needed for anxiety. 30 tablet 0  . colchicine 0.6 MG tablet TAKE 1 TABLET (0.6 MG TOTAL) BY MOUTH DAILY AS NEEDED. TAKE 2 AT THE FIRST SIGN OF GOUT, THEN 1 MORE PILL ONE HOUR LATER IF STILL HURTING. 30 tablet 0  . Control Gel Formula Dressing (DUODERM CGF DRESSING) MISC Apply 1 each topically daily. 12 each 3  . diclofenac sodium (VOLTAREN) 1 %  GEL Apply 2 g topically 4 (four) times daily. 350 g 1  . FLUoxetine (PROZAC) 20 MG capsule TAKE ONE CAPSULE BY MOUTH DAILY 90 capsule 3  . furosemide (LASIX) 20 MG tablet TAKE 1 TABLET (20 MG TOTAL) BY MOUTH DAILY. MAY TAKE AND EXTRA 20 MG TABLET IF INCREASED SHORTNESS OF BREATH OR WEIGHT GAIN. 90 tablet 2  . hydrOXYzine (ATARAX/VISTARIL) 10 MG tablet Take 1 tablet (10 mg total) by mouth at bedtime. 30 tablet 0  . Lifitegrast (XIIDRA) 5 % SOLN Place 1-2 drops into both eyes daily.     . magnesium oxide (MAG-OX) 400 (241.3 Mg) MG tablet Take 1 tablet (400 mg total) by mouth 2 (two) times daily. 180 tablet 3  . Magnesium Oxide 400 (240 Mg) MG TABS TAKE 1 TABLET (400 MG TOTAL) BY MOUTH 2 (TWO) TIMES DAILY. 180 tablet 3  . metoprolol tartrate (LOPRESSOR) 25 MG tablet TAKE ONE-HALF TABLET (12.5 MG TOTAL) BY MOUTH 2 (TWO) TIMES DAILY. 90 tablet 3  . nitroGLYCERIN (NITROSTAT) 0.4 MG SL tablet Place 1 tablet (0.4 mg total) under the tongue every 5 (five) minutes as needed for chest pain. 25 tablet 2  . pantoprazole (PROTONIX) 40 MG tablet TAKE ONE (1) TABLET BY MOUTH EVERY DAY 30 tablet 3  . polyethylene glycol (MIRALAX / GLYCOLAX) 17 g packet Take 17 g by mouth daily as needed for mild constipation. 14 each 0  . rosuvastatin (CRESTOR) 5 MG tablet TAKE 1 TABLET BY MOUTH ONCE DAILY 90 tablet 1  . silodosin (RAPAFLO) 8 MG CAPS capsule Take 8 mg by mouth daily with breakfast.    . tacrolimus (PROTOPIC) 0.1 % ointment Apply 1 application topically 2 (two) times daily.    Alveda Reasons 15 MG TABS  tablet TAKE 1 TABLET (15 MG TOTAL) BY MOUTH DAILY WITH SUPPER. 90 tablet 1   No current facility-administered medications on file prior to visit.   Allergies  Allergen Reactions  . Feldene [Piroxicam] Other (See Comments)    REACTION: Blisters  . Neomycin-Polymyxin-Hc Itching  . Statins Other (See Comments)    Myalgias  . Doxycycline Other (See Comments)    REACTION:  unknown  . Latex Rash  . Tequin Anxiety  . Valium Other (See Comments)    REACTION:  unknown   Social History   Socioeconomic History  . Marital status: Widowed    Spouse name: Not on file  . Number of children: Not on file  . Years of education: Not on file  . Highest education level: Not on file  Occupational History  . Not on file  Tobacco Use  . Smoking status: Passive Smoke Exposure - Never Smoker  . Smokeless tobacco: Former Systems developer    Types: Chew  . Tobacco comment: "quit chewing in the 1960s"  Vaping Use  . Vaping Use: Never used  Substance and Sexual Activity  . Alcohol use: No  . Drug use: No  . Sexual activity: Never  Other Topics Concern  . Not on file  Social History Narrative   01-05-18 Unable to ask abuse questions family with him today.   11-012-19 Unable to ask abuse questions family with him today.   Social Determinants of Health   Financial Resource Strain: Not on file  Food Insecurity: Not on file  Transportation Needs: Not on file  Physical Activity: Not on file  Stress: Not on file  Social Connections: Not on file  Intimate Partner Violence: Not on file      Review of  Systems  All other systems reviewed and are negative.      Objective:   Physical Exam Vitals reviewed.  Constitutional:      Appearance: Normal appearance.  Cardiovascular:     Rate and Rhythm: Normal rate and regular rhythm.  Pulmonary:     Effort: Pulmonary effort is normal.     Breath sounds: Normal breath sounds.  Neurological:     Mental Status: He is alert.   Right-sided hemiparesis with  weakness in his right leg.  Weakness with knee flexion and extension and ankle flexion and extension.  Ataxia in the right upper arm and right lower leg.         Assessment & Plan:  Hemiparesis affecting right side as late effect of stroke ([place]) - Plan: CBC with Differential/Platelet, COMPLETE METABOLIC PANEL WITH GFR  Thalamic hemorrhage (HCC)  Stage 3 chronic kidney disease, unspecified whether stage 3a or 3b CKD ([place])  History of lung cancer  ASCVD (arteriosclerotic cardiovascular disease)  Primarily wanted check lab work today to ensure that the [place] has not causing any anemia due to an undiagnosed bleed.  Therefore we will check a CBC.  I will also check a CMP to make sure that his potassium and renal function is tolerating the higher dose of Lasix however clinically he appears euvolemic today and therefore the Lasix dose seems to be appropriate.  His immunizations are up-to-date and from a review of systems standpoint he seems to be doing well with no concerns.  Did recommend that he resume his eyedrops for his chronic dry eyes

## 2020-03-16 NOTE — Progress Notes (Signed)
Spoke with daughter of pt, she is concerned that some of the result levels did flag as abnormal. Will follow up with that concern at nx office visit

## 2020-03-19 ENCOUNTER — Encounter: Payer: Self-pay | Admitting: Nurse Practitioner

## 2020-03-19 ENCOUNTER — Ambulatory Visit (INDEPENDENT_AMBULATORY_CARE_PROVIDER_SITE_OTHER): Payer: Medicare HMO | Admitting: Nurse Practitioner

## 2020-03-19 ENCOUNTER — Other Ambulatory Visit: Payer: Self-pay | Admitting: Nurse Practitioner

## 2020-03-19 ENCOUNTER — Other Ambulatory Visit: Payer: Self-pay

## 2020-03-19 VITALS — BP 126/70 | HR 64 | Temp 97.3°F | Ht 64.0 in | Wt 165.0 lb

## 2020-03-19 DIAGNOSIS — R0781 Pleurodynia: Secondary | ICD-10-CM | POA: Diagnosis not present

## 2020-03-19 NOTE — Assessment & Plan Note (Signed)
Acute, ongoing.  Likely due to incomplete fall on right side/being pushed up against metal bar to prevent fall.  Ribs likely contused vs. Fractured.  No pain with deep inspiration today.  Lung sounds completely clear on auscultation today.  Discussed options with imaging vs. Watchful waiting.  Elect to proceed with watchful waiting; continue Tylenol for pain relief and start ice to help decrease inflammation.  X-ray imaging of ribs ordered in case they change their minds.  At high risk for development of pneumonia - encouraged deep breathing exercises multiple times per day to prevent illness in lungs.  With any shortness of breath, pain with inspiration, fevers, or cough, return to clinic.

## 2020-03-19 NOTE — Progress Notes (Signed)
Subjective:    Patient ID: Evan [name], male    DOB: 1928/03/02, 86 y.o.   MRN: [id number]  HPI: Evan [name] is a 86 y.o. male presenting with daughter, Abe People, for right rib cage pain.  Chief Complaint  Patient presents with  . Pain    On the right side of the abdomen, stumbled up against bar while walking at home. Has puffiness and hardened on that side noticed to be getting worse   RIB CAGE PAIN  Started complaining of pain Tuesday night. Duration: 3 days Onset: sudden Severity: mild Quality: cannot tell Location:  Right side of abdomen "right under ribs" Episode duration: days Radiation: no Frequency: hard to tell  Alleviating factors:  tylenol Aggravating factors: laying on Status: worse Treatments attempted: Tylenol Fever: no Nausea: no Vomiting: no Weight loss: no Decreased appetite: no Diarrhea: no Constipation: no Shortness of breath: no Chest pain: no  Allergies  Allergen Reactions  . Feldene [Piroxicam] Other (See Comments)    REACTION: Blisters  . Neomycin-Polymyxin-Hc Itching  . Statins Other (See Comments)    Myalgias  . Doxycycline Other (See Comments)    REACTION:  unknown  . Latex Rash  . Tequin Anxiety  . Valium Other (See Comments)    REACTION:  unknown    Outpatient Encounter Medications as of 03/19/2020  Medication Sig Note  . acetaminophen (TYLENOL) 325 MG tablet Take 1-2 tablets (325-650 mg total) by mouth every 4 (four) hours as needed for mild pain.   Marland Kitchen ALPRAZolam (XANAX) 0.5 MG tablet Take 0.5 tablets (0.25 mg total) by mouth 3 (three) times daily as needed for anxiety.   . colchicine 0.6 MG tablet TAKE 1 TABLET (0.6 MG TOTAL) BY MOUTH DAILY AS NEEDED. TAKE 2 AT THE FIRST SIGN OF GOUT, THEN 1 MORE PILL ONE HOUR LATER IF STILL HURTING.   . Control Gel Formula Dressing (DUODERM CGF DRESSING) MISC Apply 1 each topically daily.   . diclofenac sodium (VOLTAREN) 1 % GEL Apply 2 g topically 4 (four) times daily.   Marland Kitchen FLUoxetine (PROZAC)  20 MG capsule TAKE ONE CAPSULE BY MOUTH DAILY   . furosemide (LASIX) 20 MG tablet TAKE 1 TABLET (20 MG TOTAL) BY MOUTH DAILY. MAY TAKE AND EXTRA 20 MG TABLET IF INCREASED SHORTNESS OF BREATH OR WEIGHT GAIN. 03/13/2020: Takes 3 times weekly  . Lifitegrast (XIIDRA) 5 % SOLN Place 1-2 drops into both eyes daily.    . magnesium oxide (MAG-OX) 400 (241.3 Mg) MG tablet Take 1 tablet (400 mg total) by mouth 2 (two) times daily.   . Magnesium Oxide 400 (240 Mg) MG TABS TAKE 1 TABLET (400 MG TOTAL) BY MOUTH 2 (TWO) TIMES DAILY.   . metoprolol tartrate (LOPRESSOR) 25 MG tablet TAKE ONE-HALF TABLET (12.5 MG TOTAL) BY MOUTH 2 (TWO) TIMES DAILY.   . nitroGLYCERIN (NITROSTAT) 0.4 MG SL tablet Place 1 tablet (0.4 mg total) under the tongue every 5 (five) minutes as needed for chest pain.   . pantoprazole (PROTONIX) 40 MG tablet TAKE ONE (1) TABLET BY MOUTH EVERY DAY   . polyethylene glycol (MIRALAX / GLYCOLAX) 17 g packet Take 17 g by mouth daily as needed for mild constipation.   . rosuvastatin (CRESTOR) 5 MG tablet TAKE 1 TABLET BY MOUTH ONCE DAILY   . silodosin (RAPAFLO) 8 MG CAPS capsule Take 8 mg by mouth daily with breakfast.   . tacrolimus (PROTOPIC) 0.1 % ointment Apply 1 application topically 2 (two) times daily.   Marland Kitchen  XARELTO 15 MG TABS tablet TAKE 1 TABLET (15 MG TOTAL) BY MOUTH DAILY WITH SUPPER.   . hydrOXYzine (ATARAX/VISTARIL) 10 MG tablet Take 1 tablet (10 mg total) by mouth at bedtime. (Patient not taking: Reported on 03/19/2020)    No facility-administered encounter medications on file as of 03/19/2020.    Patient Active Problem List   Diagnosis Date Noted  . Rib pain on right side 03/19/2020  . Sundowning 07/04/2019  . Bilateral hearing loss 04/16/2019  . Urinary frequency 03/21/2019  . Chronic right shoulder pain 12/19/2018  . Weakness generalized 09/28/2018  . Palliative care encounter 09/28/2018  . Pressure injury of skin 08/27/2018  . COPD (chronic obstructive pulmonary disease)  ([place]) 08/26/2018  . AMS (altered mental status) 08/22/2018  . Altered mental status 08/21/2018  . UTI (urinary tract infection) 08/21/2018  . Abnormality of gait 08/20/2018  . Prolonged Q-T interval on ECG   . Dysphagia, post-stroke   . Supplemental oxygen dependent   . Anxiety state   . Fatigue   . SOB (shortness of breath)   . Hyperglycemia   . History of lung cancer   . Depression   . CKD (chronic kidney disease), stage III ([place])   . Acute blood loss anemia   . Fall 07/16/2018  . 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 Thalamic hemorrhage ([place]) 07/16/2018  . Acute spontaneous 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 ([place]) L thalamic 07/14/2018  . Cellulitis of arm, right 06/01/2018  . Rash and nonspecific skin eruption 06/01/2018  . Chest pain 02/02/2018  . Thrombocytopenia ([place]) 02/02/2018  . Hypokalemia 02/02/2018  . Pulmonary embolism ([place]) 02/02/2018  . Elevated troponin 02/02/2018  . Pulmonary embolus ([place]) 02/02/2018  . Non-ST elevation MI (NSTEMI) ([place]) 06/27/2017  . Diastolic CHF ([place]) 74/25/9563  . Chest pain due to CAD ([place]) 06/26/2017  . Dyspnea 06/21/2017  . Acute bronchitis 05/15/2017  . Oral candidiasis 05/15/2017  . Acute pericarditis   . Chest pain at rest 12/20/2016  . Angina pectoris ([place]) 12/20/2016  . Radiation pneumonitis ([place]) 11/09/2016  . Pleurisy 10/11/2016  . Chest wall pain 10/11/2016  . Atrial fibrillation ([place]) 05/21/2016  . Adenocarcinoma of right lung ([place]) 01/06/2016  . GAD (generalized anxiety disorder) 11/20/2015  . Essential hypertension   . Cellulitis 09/04/2015  . Cellulitis of left axilla   . Esophageal ulcer without bleeding   . Bilateral lower extremity edema 04/28/2014  . BPH (benign prostatic hyperplasia) 10/08/2012  . Anxiety   . EKG abnormalities 09/05/2012  . Tremor 09/05/2012  . Chronic fatigue 09/05/2012  . Arthritis   . Asthma, chronic   . Reflux esophagitis   . S/P CABG (coronary  artery bypass graft)   . GERD (gastroesophageal reflux disease)   . Hyperlipidemia   . Hypertension   . UNSPECIFIED PERIPHERAL VASCULAR DISEASE 06/16/2009    Past Medical History:  Diagnosis Date  . Adenocarcinoma of right lung ([place]) 01/06/2016  . Anginal chest pain at rest [facility])    Chronic non, controlled on Lorazepam  . Anxiety   . Arthritis    "all over"   . BPH (benign prostatic hyperplasia)   . CAD (coronary artery disease)   . Chronic lower back pain   . Complication of anesthesia    "problems making water afterwards"  . Depression   . DJD (degenerative joint disease)   . Esophageal ulcer without bleeding    bid ppi indefinitely  . Family history of adverse reaction to anesthesia    "children w/PONV"  .  GERD (gastroesophageal reflux disease)   . Headache    "couple/week maybe" (09/04/2015)  . Hemochromatosis    Possible, elevated iron stores, hook-like osteophytes on hand films, normal LFTs  . Hepatitis    "yellow jaundice as a baby"  . History of ASCVD    MULTIVESSEL  . Hyperlipidemia   . Hypertension   . Osteoarthritis   . Pneumonia    "several times; got a little now" (09/04/2015)  . Rheumatoid arthritis ([place])   . Subdural hematoma (HCC)    small after fall 09/2016-plavix held, neurosurgery consulted.  Asymptomatic.    Marland Kitchen 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[place]) 02/2018   on Lovenox lifelong since he failed PO Eliquis    Relevant past medical, surgical, family and social history reviewed and updated as indicated. Interim medical history since our last visit reviewed.  Review of Systems  Constitutional: Negative.  Negative for activity change, appetite change, fatigue and fever.  Respiratory: Negative.  Negative for chest tightness, shortness of breath and wheezing.   Cardiovascular: Negative.  Negative for chest pain.  Gastrointestinal: Positive for abdominal pain (under right rib cage). Negative for blood in stool, constipation, diarrhea, nausea and vomiting.  Skin: Negative.   Negative for color change and rash.  Neurological: Negative.   Psychiatric/Behavioral: Negative.     Per HPI unless specifically indicated above     Objective:    BP 126/70   Pulse 64   Temp (!) 97.3 F (36.3 C)   Ht 5\' 4"  (1.626 m)   Wt 165 lb (74.8 kg)   SpO2 96%   BMI 28.32 kg/m   Wt Readings from Last 3 Encounters:  03/19/20 165 lb (74.8 kg)  03/13/20 165 lb (74.8 kg)  01/23/20 168 lb (76.2 kg)    Physical Exam Vitals and nursing note reviewed.  Constitutional:      General: He is not in acute distress.    Appearance: Normal appearance. He is not toxic-appearing.  HENT:     Head: Normocephalic and atraumatic.  Cardiovascular:     Rate and Rhythm: Normal rate and regular rhythm.     Heart sounds: Normal heart sounds.  Pulmonary:     Effort: Pulmonary effort is normal. No respiratory distress.     Breath sounds: Normal breath sounds. No wheezing, rhonchi or rales.  Chest:     Chest wall: No tenderness.  Abdominal:     General: Abdomen is flat. Bowel sounds are normal. There is no distension.     Palpations: Abdomen is soft.     Tenderness: There is abdominal tenderness (point tenderness to ribs on right side). There is no right CVA tenderness, left CVA tenderness, guarding or rebound.     Comments: Full abdominal examination hindered due to patient's mobility - is wheelchair bound  Skin:    General: Skin is warm and dry.     Coloration: Skin is not jaundiced or pale.     Findings: No erythema.  Neurological:     Mental Status: He is alert. Mental status is at baseline.  Psychiatric:        Mood and Affect: Mood normal.        Thought Content: Thought content normal.       Assessment & Plan:   Problem List Items Addressed This Visit      Other   Rib pain on right side - Primary    Acute, ongoing.  Likely due to incomplete fall on right side/being pushed up against metal bar to prevent fall.  Ribs likely contused vs. Fractured.  No pain with deep  inspiration today.  Lung sounds completely clear on auscultation today.  Discussed options with imaging vs. Watchful waiting.  Elect to proceed with watchful waiting; continue Tylenol for pain relief and start ice to help decrease inflammation.  X-ray imaging of ribs ordered in case they change their minds.  At high risk for development of pneumonia - encouraged deep breathing exercises multiple times per day to prevent illness in lungs.  With any shortness of breath, pain with inspiration, fevers, or cough, return to clinic.       Relevant Orders   DG Ribs Unilateral Right       Follow up plan: Return if symptoms worsen or fail to improve.

## 2020-03-19 NOTE — Patient Instructions (Signed)
Rib Contusion A rib contusion is a deep bruise on your rib area. Contusions are the result of a blunt trauma that causes bleeding and injury to the tissues under the skin. A rib contusion may involve bruising of the ribs and of the skin and muscles in the area. The skin over the contusion may turn blue, purple, or yellow. Minor injuries will give you a painless contusion. More severe contusions may stay painful and swollen for a few weeks. What are the causes? This condition is usually caused by a blow, trauma, or direct force to an area of the body. This often occurs while playing contact sports. What are the signs or symptoms? Symptoms of this condition include:  Swelling and redness of the injured area.  Discoloration of the injured area.  Tenderness and soreness of the injured area.  Pain with or without movement. How is this diagnosed? This condition may be diagnosed based on:  Your symptoms and medical history.  A physical exam.  Imaging tests--such as an X-ray, CT scan, or MRI--to determine if there were internal injuries or broken bones (fractures). How is this treated? This condition may be treated with:  Rest. This is often the best treatment for a rib contusion.  Icing. This reduces swelling and inflammation.  Deep-breathing exercises. These may be recommended to reduce the risk for lung collapse and pneumonia.  Medicines. Over-the-counter or prescription medicines may be given to control pain.  Injection of a numbing medicine around the nerve near your injury (nerve block). Follow these instructions at home:     Medicines  Take over-the-counter and prescription medicines only as told by your health care provider.  Do not drive or use heavy machinery while taking prescription pain medicine.  If you are taking prescription pain medicine, take actions to prevent or treat constipation. Your health care provider may recommend that you: ? Drink enough fluid to keep  your urine pale yellow. ? Eat foods that are high in fiber, such as fresh fruits and vegetables, whole grains, and beans. ? Limit foods that are high in fat and processed sugars, such as fried or sweet foods. ? Take an over-the-counter or prescription medicine for constipation. Managing pain, stiffness, and swelling  If directed, put ice on the injured area: ? Put ice in a plastic bag. ? Place a towel between your skin and the bag. ? Leave the ice on for 20 minutes, 2-3 times a day.  Rest the injured area. Avoid strenuous activity and any activities or movements that cause pain. Be careful during activities and avoid bumping the injured area.  Do not lift anything that is heavier than 5 lb (2.3 kg), or the limit that you are told, until your health care provider says that it is safe. General instructions  Do not use any products that contain nicotine or tobacco, such as cigarettes and e-cigarettes. These can delay healing. If you need help quitting, ask your health care provider.  Do deep-breathing exercises as told by your health care provider.  If you were given an incentive spirometer, use it every 1-2 hours while you are awake, or as recommended by your health care provider. This device measures how well you are filling your lungs with each breath.  Keep all follow-up visits as told by your health care provider. This is important. Contact a health care provider if you have:  Increased bruising or swelling.  Pain that is not controlled with treatment.  A fever. Get help right away if  you:  Have difficulty breathing or shortness of breath.  Develop a continual cough or you cough up thick or bloody sputum.  Feel nauseous or you vomit.  Have pain in your abdomen. Summary  A rib contusion is a deep bruise on your rib area. Contusions are the result of a blunt trauma that causes bleeding and injury to the tissues under the skin.  The skin overlying the contusion may turn  blue, purple, or yellow. Minor injuries may give you a painless contusion. More severe contusions may stay painful and swollen for a few weeks.  Rest the injured area. Avoid strenuous activity and any activities or movements that cause pain. This information is not intended to replace advice given to you by your health care provider. Make sure you discuss any questions you have with your health care provider. Document Revised: 04/19/2017 Document Reviewed: 04/19/2017 Elsevier Patient Education  2020 [employer].

## 2020-03-23 DIAGNOSIS — S0190XA Unspecified open wound of unspecified part of head, initial encounter: Secondary | ICD-10-CM | POA: Diagnosis not present

## 2020-03-23 DIAGNOSIS — L8940 Pressure ulcer of contiguous site of back, buttock and hip, unspecified stage: Secondary | ICD-10-CM | POA: Diagnosis not present

## 2020-03-23 DIAGNOSIS — L89519 Pressure ulcer of right ankle, unspecified stage: Secondary | ICD-10-CM | POA: Diagnosis not present

## 2020-03-24 ENCOUNTER — Encounter (HOSPITAL_COMMUNITY): Payer: Self-pay | Admitting: Physical Therapy

## 2020-04-02 DIAGNOSIS — R3915 Urgency of urination: Secondary | ICD-10-CM | POA: Diagnosis not present

## 2020-05-01 DIAGNOSIS — R3915 Urgency of urination: Secondary | ICD-10-CM | POA: Diagnosis not present

## 2020-05-29 DIAGNOSIS — R3915 Urgency of urination: Secondary | ICD-10-CM | POA: Diagnosis not present

## 2020-06-05 ENCOUNTER — Other Ambulatory Visit: Payer: Self-pay

## 2020-06-09 ENCOUNTER — Telehealth: Payer: Self-pay

## 2020-06-09 NOTE — Telephone Encounter (Signed)
Pharm faxed med refill:  Magnesium 400 mg  Fluoxetine 20 mg  Metoprolol Tartrate 25 mg  Pharmacy: [facility]

## 2020-06-09 NOTE — Telephone Encounter (Signed)
Pharm faxed in med refill  pantoprazole (PROTONIX) 40 MG tablet  Pharmacy: [facility]

## 2020-06-09 NOTE — Telephone Encounter (Signed)
error 

## 2020-06-10 DIAGNOSIS — S0190XA Unspecified open wound of unspecified part of head, initial encounter: Secondary | ICD-10-CM | POA: Diagnosis not present

## 2020-06-10 DIAGNOSIS — L89519 Pressure ulcer of right ankle, unspecified stage: Secondary | ICD-10-CM | POA: Diagnosis not present

## 2020-06-10 DIAGNOSIS — L8940 Pressure ulcer of contiguous site of back, buttock and hip, unspecified stage: Secondary | ICD-10-CM | POA: Diagnosis not present

## 2020-06-10 MED ORDER — FLUOXETINE HCL 20 MG PO CAPS: 20.0000 mg | ORAL_CAPSULE | Freq: Every day | ORAL | 3 refills | Status: DC

## 2020-06-10 MED ORDER — METOPROLOL TARTRATE 25 MG PO TABS: ORAL_TABLET | ORAL | 3 refills | Status: DC

## 2020-06-10 MED ORDER — MAGNESIUM OXIDE 400 (241.3 MG) MG PO TABS: 400.0000 mg | ORAL_TABLET | Freq: Two times a day (BID) | ORAL | 3 refills | Status: DC

## 2020-06-10 MED ORDER — PANTOPRAZOLE SODIUM 40 MG PO TBEC: DELAYED_RELEASE_TABLET | ORAL | 3 refills | Status: DC

## 2020-06-10 NOTE — Telephone Encounter (Signed)
Prescription sent to pharmacy.

## 2020-06-10 NOTE — Addendum Note (Signed)
Addended by: Sheral Flow on: 06/10/2020 05:27 PM   Modules accepted: Orders

## 2020-06-11 ENCOUNTER — Other Ambulatory Visit: Payer: Self-pay

## 2020-06-11 MED ORDER — RIVAROXABAN 15 MG PO TABS: ORAL_TABLET | ORAL | 1 refills | Status: DC

## 2020-06-11 MED ORDER — FUROSEMIDE 20 MG PO TABS: ORAL_TABLET | ORAL | 0 refills | Status: DC

## 2020-06-11 NOTE — Telephone Encounter (Signed)
Pt last saw Dr Irish Lack 06/25/19, pt has yearly f/u scheduled for 06/22/20.  Last labs Creat 1.28, age 87, weight 74.8kg, CrCl 40.58, based on CrCl pt is on appropriate dosage of Xarelto 15mg  QD for afib.  Will refill rx.

## 2020-06-15 ENCOUNTER — Other Ambulatory Visit: Payer: Self-pay

## 2020-06-15 MED ORDER — ROSUVASTATIN CALCIUM 5 MG PO TABS: 5.0000 mg | ORAL_TABLET | Freq: Every day | ORAL | 1 refills | Status: DC

## 2020-06-15 MED ORDER — FUROSEMIDE 20 MG PO TABS: ORAL_TABLET | ORAL | 0 refills | Status: DC

## 2020-06-15 MED ORDER — RIVAROXABAN 15 MG PO TABS: ORAL_TABLET | ORAL | 1 refills | Status: DC

## 2020-06-21 NOTE — Progress Notes (Signed)
Cardiology Office Note   Date:  06/22/2020   ID:  Evan [name], DOB [birth year], MRN [id number]  PCP:  Evan Frizzle, MD    No chief complaint on file.  CAD  Wt Readings from Last 3 Encounters:  06/22/20 162 lb (73.5 kg)  03/19/20 165 lb (74.8 kg)  03/13/20 165 lb (74.8 kg)       History of Present Illness: Evan [name] is a 87 y.o. male  with a history of CAD s/p CABG x2 in 1998.  Patent stents and grafts on cardiac catheterization in 06/2017, atrial fibrillation/flutter on Eliquis, chronic diastolic heart failure,Adenocarcinoma of the right lower lungand lung cancerpresents for hospital follow up.   Admitted 02/2018 for chest pain.Found to have acute DVT and PEwhile misseddoses of anticoagulation on Eliquis prior to admission. Echo yesterday shows LVEF 60-65% without regional wall motion abnormalities, there is moderate RAE, but normal RV size and function, not suggestive of RH strain - the PE was noted to be peripheral.  Patient follows with radiation oncology for his lung cancer. Concern for disease progression. Planisto continue Lovenox indefinitely and follow up with [facility].  He had issues with thalamic hemorrhage. He has had a UTI as well. Anticoagulation was stopped after the thalamic bleed. He has followed up with neurology as well.  He had gout.   In 2020, switched back to Xarelto. Leg weakness prevents walking. Fall in Dec 2020 with a compression fracture in his spine.  PT stopped at that time.   Wisdom teeth removal was planned in 2021.  Went well with mildly increased bleeding.  He has some cough at night.  Worse with allergies.   Denies : exertional Chest pain. Dizziness. Leg edema. Nitroglycerin use. Orthopnea. Palpitations. Paroxysmal nocturnal dyspnea. Shortness of breath. Syncope.   Not much activity.  Limited to wheel chair much of the time. He has lost 30 lbs since 2021 ( 193 lbs in 3/21).  Takes Ensure at lunch.  Past Medical  History:  Diagnosis Date  . Adenocarcinoma of right lung ([place]) 01/06/2016  . Anginal chest pain at rest [facility])    Chronic non, controlled on Lorazepam  . Anxiety   . Arthritis    "all over"   . BPH (benign prostatic hyperplasia)   . CAD (coronary artery disease)   . Chronic lower back pain   . Complication of anesthesia    "problems making water afterwards"  . Depression   . DJD (degenerative joint disease)   . Esophageal ulcer without bleeding    bid ppi indefinitely  . Family history of adverse reaction to anesthesia    "children w/PONV"  . GERD (gastroesophageal reflux disease)   . Headache    "couple/week maybe" (09/04/2015)  . Hemochromatosis    Possible, elevated iron stores, hook-like osteophytes on hand films, normal LFTs  . Hepatitis    "yellow jaundice as a baby"  . History of ASCVD    MULTIVESSEL  . Hyperlipidemia   . Hypertension   . Osteoarthritis   . Pneumonia    "several times; got a little now" (09/04/2015)  . Rheumatoid arthritis ([place])   . Subdural hematoma ([facility])    small after fall 09/2016-plavix held, neurosurgery consulted.  Asymptomatic.    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 ARM SKIN LESION BIOPSY / EXCISION Left 09/02/2015  . CATARACT EXTRACTION W/ INTRAOCULAR LENS  IMPLANT, BILATERAL Bilateral   . CHOLECYSTECTOMY OPEN    . CORNEAL TRANSPLANT Bilateral    "one at [facility]; one at [facility]"  . CORONARY ANGIOPLASTY WITH STENT PLACEMENT    . CORONARY ARTERY BYPASS GRAFT  1999  . DESCENDING AORTIC ANEURYSM REPAIR W/ STENT     DE stent ostium into the right radial free graft at OM-, 02-2007  . ESOPHAGOGASTRODUODENOSCOPY N/A 11/09/2014   Procedure: ESOPHAGOGASTRODUODENOSCOPY (EGD);  Surgeon: [name], MD;  Location: [facility] ENDOSCOPY;  Service: Endoscopy;  Laterality: N/A;  . INGUINAL HERNIA REPAIR    . JOINT REPLACEMENT    . LEFT HEART CATH AND CORS/GRAFTS  ANGIOGRAPHY N/A 06/27/2017   Procedure: LEFT HEART CATH AND CORS/GRAFTS ANGIOGRAPHY;  Surgeon: [name], MD;  Location: [place] CV LAB;  Service: Cardiovascular;  Laterality: N/A;  . NASAL SINUS SURGERY    . SHOULDER OPEN ROTATOR CUFF REPAIR Right   . TOTAL KNEE ARTHROPLASTY Bilateral      Current Outpatient Medications  Medication Sig Dispense Refill  . acetaminophen (TYLENOL) 325 MG tablet Take 1-2 tablets (325-650 mg total) by mouth every 4 (four) hours as needed for mild pain.    Marland Kitchen ALPRAZolam (XANAX) 0.5 MG tablet Take 0.5 tablets (0.25 mg total) by mouth 3 (three) times daily as needed for anxiety. 30 tablet 0  . Control Gel Formula Dressing (DUODERM CGF DRESSING) MISC Apply 1 each topically daily. 12 each 3  . diclofenac sodium (VOLTAREN) 1 % GEL Apply 2 g topically 4 (four) times daily. 350 g 1  . FLUoxetine (PROZAC) 20 MG capsule Take 1 capsule (20 mg total) by mouth daily. 90 capsule 3  . furosemide (LASIX) 20 MG tablet TAKE 1 TABLET (20 MG TOTAL) BY MOUTH DAILY. MAY TAKE AND EXTRA 20 MG TABLET IF INCREASED SHORTNESS OF BREATH OR WEIGHT GAIN. Please make overdue appt with Dr. Irish Lack. 1st attempt (Patient taking differently: TAKE 1 TABLET (20 MG TOTAL) BY MOUTH DAILY. TAKE 2 TABLETS DAILY EVERY Monday, Wednesday, AND Friday.) 45 tablet 0  . hydrOXYzine (ATARAX/VISTARIL) 10 MG tablet Take 1 tablet (10 mg total) by mouth at bedtime. 30 tablet 0  . Lifitegrast (XIIDRA) 5 % SOLN Place 1-2 drops into both eyes daily.     . magnesium oxide (MAG-OX) 400 (241.3 Mg) MG tablet Take 1 tablet (400 mg total) by mouth 2 (two) times daily. 180 tablet 3  . metoprolol tartrate (LOPRESSOR) 25 MG tablet TAKE ONE-HALF TABLET (12.5 MG TOTAL) BY MOUTH 2 (TWO) TIMES DAILY. 90 tablet 3  . nitroGLYCERIN (NITROSTAT) 0.4 MG SL tablet Place 1 tablet (0.4 mg total) under the tongue every 5 (five) minutes as needed for chest pain. 25 tablet 2  . pantoprazole (PROTONIX) 40 MG tablet TAKE ONE (1) TABLET BY  MOUTH EVERY DAY 90 tablet 3  . polyethylene glycol (MIRALAX / GLYCOLAX) 17 g packet Take 17 g by mouth daily as needed for mild constipation. 14 each 0  . Rivaroxaban (XARELTO) 15 MG TABS tablet TAKE 1 TABLET (15 MG TOTAL) BY MOUTH DAILY WITH SUPPER. 90 tablet 1  . rosuvastatin (CRESTOR) 5 MG tablet Take 1 tablet (5 mg total) by mouth daily. 90 tablet 1  . silodosin (RAPAFLO) 8 MG CAPS capsule Take 8 mg by mouth daily with breakfast.    . tacrolimus (PROTOPIC) 0.1 % ointment Apply 1 application topically 2 (two) times daily.     No current facility-administered medications for this visit.    Allergies:   Feldene [piroxicam], Neomycin-polymyxin-hc, Statins,  Doxycycline, Latex, Tequin, and Valium    Social History:  The patient  reports that he is a non-smoker but has been exposed to tobacco smoke. He has quit using smokeless tobacco.  His smokeless tobacco use included chew. He reports that he does not drink alcohol and does not use drugs.   Family History:  The patient's family history includes Arthritis-Osteo in his sister; Heart attack in his brother and another family member.    ROS:  Please see the history of present illness.   Otherwise, review of systems are positive for left leg swelling.   All other systems are reviewed and negative.    PHYSICAL EXAM: VS:  BP 122/64   Pulse 64   Ht 5\' 4"  (1.626 m)   Wt 162 lb (73.5 kg)   SpO2 92%   BMI 27.81 kg/m  , BMI Body mass index is 27.81 kg/m. GEN: Well nourished, well developed, in no acute distress  HEENT: normal  Neck: no JVD, carotid bruits, or masses Cardiac: irregularly irregular, rate controlled; no murmurs, rubs, or gallop;,tr left leg edema  Respiratory:  clear to auscultation bilaterally, normal work of breathing GI: soft, nontender, nondistended, + BS MS: no deformity or atrophy  Skin: warm and dry, no rash Neuro:  Strength and sensation are intact Psych: euthymic mood, full affect   EKG:   The ekg ordered today  demonstrates AFib, rate controlled.   Recent Labs: 10/14/2019: Magnesium 2.1 01/23/2020: Pro B Natriuretic peptide (BNP) 335.0 03/13/2020: ALT 6; BUN 30; Creat 1.28; Hemoglobin 11.1; Platelets 131; Potassium 4.1; Sodium 137   Lipid Panel    Component Value Date/Time   CHOL 75 02/03/2018 0524   CHOL 112 01/19/2018 0929   CHOL 97 09/09/2013 0957   TRIG 60 02/03/2018 0524   TRIG 92 09/09/2013 0957   HDL 28 (L) 02/03/2018 0524   HDL 35 (L) 01/19/2018 0929   HDL 37 (L) 09/09/2013 0957   CHOLHDL 2.7 02/03/2018 0524   VLDL 12 02/03/2018 0524   LDLCALC 35 02/03/2018 0524   LDLCALC 59 01/19/2018 0929   LDLCALC 42 09/09/2013 0957     Other studies Reviewed: Additional studies/ records that were reviewed today with results demonstrating: Prior labs reviewed.   ASSESSMENT AND PLAN:  1. CAD: No clear angina. Continue aggressive secondary prevention.  It has been several years since his lipids were checked.  We will check today since he is fasting or getting blood work. 2. DVT/PE: XArelto.  Check CBC and renal function. 3. PAF: Rate controlled.  Xarelto for stroke prevention. 4. Chronic diastolic heart failure: Furosemide was increased 3x/week to 2 tabs.  He has not had labs since then.  Will repeat today to make sure renal function and potassium are within normal limits.. 5. Hyperlipidemia: Continue rosuvastatin. Check lipids today.  6. I stressed the importance of avoiding falls.   Current medicines are reviewed at length with the patient today.  The patient concerns regarding his medicines were addressed.  The following changes have been made:  No change  Labs/ tests ordered today include: as noted above No orders of the defined types were placed in this encounter.   Recommend 150 minutes/week of aerobic exercise Low fat, low carb, high fiber diet recommended  Disposition:   FU in 1 year   Signed, Larae Grooms, MD  06/22/2020 9:42 AM    [facility] [address], [place], [place]  [address] Phone: [phone]; Fax: [phone]

## 2020-06-22 ENCOUNTER — Ambulatory Visit (INDEPENDENT_AMBULATORY_CARE_PROVIDER_SITE_OTHER): Payer: Medicare HMO | Admitting: Family Medicine

## 2020-06-22 ENCOUNTER — Encounter: Payer: Self-pay | Admitting: Interventional Cardiology

## 2020-06-22 ENCOUNTER — Encounter: Payer: Self-pay | Admitting: Family Medicine

## 2020-06-22 ENCOUNTER — Ambulatory Visit: Payer: Medicare HMO | Admitting: Interventional Cardiology

## 2020-06-22 ENCOUNTER — Other Ambulatory Visit: Payer: Self-pay | Admitting: Family Medicine

## 2020-06-22 ENCOUNTER — Other Ambulatory Visit: Payer: Self-pay

## 2020-06-22 VITALS — BP 126/64 | HR 60 | Temp 98.4°F | Resp 16

## 2020-06-22 VITALS — BP 122/64 | HR 64 | Ht 64.0 in | Wt 162.0 lb

## 2020-06-22 DIAGNOSIS — Z951 Presence of aortocoronary bypass graft: Secondary | ICD-10-CM | POA: Diagnosis not present

## 2020-06-22 DIAGNOSIS — I5032 Chronic diastolic (congestive) heart failure: Secondary | ICD-10-CM | POA: Diagnosis not present

## 2020-06-22 DIAGNOSIS — I2510 Atherosclerotic heart disease of native coronary artery without angina pectoris: Secondary | ICD-10-CM | POA: Diagnosis not present

## 2020-06-22 DIAGNOSIS — I69351 Hemiplegia and hemiparesis following cerebral infarction affecting right dominant side: Secondary | ICD-10-CM | POA: Diagnosis not present

## 2020-06-22 DIAGNOSIS — R300 Dysuria: Secondary | ICD-10-CM | POA: Diagnosis not present

## 2020-06-22 DIAGNOSIS — I10 Essential (primary) hypertension: Secondary | ICD-10-CM | POA: Diagnosis not present

## 2020-06-22 DIAGNOSIS — R399 Unspecified symptoms and signs involving the genitourinary system: Secondary | ICD-10-CM

## 2020-06-22 DIAGNOSIS — M069 Rheumatoid arthritis, unspecified: Secondary | ICD-10-CM | POA: Diagnosis not present

## 2020-06-22 DIAGNOSIS — Z7901 Long term (current) use of anticoagulants: Secondary | ICD-10-CM

## 2020-06-22 DIAGNOSIS — I480 Paroxysmal atrial fibrillation: Secondary | ICD-10-CM | POA: Diagnosis not present

## 2020-06-22 LAB — URINALYSIS, ROUTINE W REFLEX MICROSCOPIC
Bilirubin Urine: NEGATIVE
Glucose, UA: NEGATIVE
Hgb urine dipstick: NEGATIVE
Ketones, ur: NEGATIVE
Leukocytes,Ua: NEGATIVE
Nitrite: NEGATIVE
Protein, ur: NEGATIVE
Specific Gravity, Urine: 1.015 (ref 1.001–1.03)
pH: 7 (ref 5.0–8.0)

## 2020-06-22 MED ORDER — SULFAMETHOXAZOLE-TRIMETHOPRIM 800-160 MG PO TABS: 1.0000 | ORAL_TABLET | Freq: Two times a day (BID) | ORAL | 0 refills | Status: DC

## 2020-06-22 NOTE — Patient Instructions (Signed)
Medication Instructions:  Your physician recommends that you continue on your current medications as directed. Please refer to the Current Medication list given to you today.  *If you need a refill on your cardiac medications before your next appointment, please call your pharmacy*   Lab Work: Lab work to be done today--CMET, CBC, TSH, lipid profile If you have labs (blood work) drawn today and your tests are completely normal, you will receive your results only by: Marland Kitchen MyChart Message (if you have MyChart) OR . A paper copy in the mail If you have any lab test that is abnormal or we need to change your treatment, we will call you to review the results.   Testing/Procedures: none   Follow-Up: At [facility], you and your health needs are our priority.  As part of our continuing mission to provide you with exceptional heart care, we have created designated Provider Care Teams.  These Care Teams include your primary Cardiologist (physician) and Advanced Practice Providers (APPs -  Physician Assistants and Nurse Practitioners) who all work together to provide you with the care you need, when you need it.  We recommend signing up for the patient portal called "MyChart".  Sign up information is provided on this After Visit Summary.  MyChart is used to connect with patients for Virtual Visits (Telemedicine).  Patients are able to view lab/test results, encounter notes, upcoming appointments, etc.  Non-urgent messages can be sent to your provider as well.   To learn more about what you can do with MyChart, go to [email or web address].    Your next appointment:   12 month(s)  The format for your next appointment:   In Person  Provider:   You may see Larae Grooms, MD or one of the following Advanced Practice Providers on your designated Care Team:    Melina Copa, PA-C  Ermalinda Barrios, PA-C    Other Instructions

## 2020-06-22 NOTE — Progress Notes (Signed)
Subjective:    Patient ID: Evan [name], male    DOB: [birth year], 87 y.o.   MRN: [id number]  HPI   Patient is a very pleasant 87 year old Caucasian male who is here today with his daughter who is his primary caregiver.  Patient suffered a hemorrhagic stroke and has right-sided hemiparesis.  He has diminished sensation in his right leg.  He is unable to feel vibration, pain, or cold in his right leg.  He also has ataxia in his right arm and right leg.  He has chronic hemiparesis in the right leg due to the stroke.  Patient is on chronic anticoagulation with Xarelto.  He spends the majority of the day in a wheelchair.  Over the last week he has developed increased urinary frequency.  He developed increased urinary urgency.  He will urinate and then shortly after he finishes, he feels like he has to pee again.  He denies any dysuria but he does report dribbling and weak stream.  This is a sudden change for the patient and has not been gradual in onset.  He is on Lasix which he takes 20 mg every day and then 30 mg on Monday Wednesday Friday.  There is no edema in his legs.  His lungs are clear to auscultation except for the right base which is chronic crackles due to scarring from his lung cancer treatment.  He certainly does not appear fluid overloaded.  Daughter states that he is been more confused recently and also has had a poor appetite Past Medical History:  Diagnosis Date  . Adenocarcinoma of right lung ([place]) 01/06/2016  . Anginal chest pain at rest [facility])    Chronic non, controlled on Lorazepam  . Anxiety   . Arthritis    "all over"   . BPH (benign prostatic hyperplasia)   . CAD (coronary artery disease)   . Chronic lower back pain   . Complication of anesthesia    "problems making water afterwards"  . Depression   . DJD (degenerative joint disease)   . Esophageal ulcer without bleeding    bid ppi indefinitely  . Family history of adverse reaction to anesthesia    "children w/PONV"  .  GERD (gastroesophageal reflux disease)   . Headache    "couple/week maybe" (09/04/2015)  . Hemochromatosis    Possible, elevated iron stores, hook-like osteophytes on hand films, normal LFTs  . Hepatitis    "yellow jaundice as a baby"  . History of ASCVD    MULTIVESSEL  . Hyperlipidemia   . Hypertension   . Osteoarthritis   . Pneumonia    "several times; got a little now" (09/04/2015)  . Rheumatoid arthritis ([place])   . Subdural hematoma (HCC)    small after fall 09/2016-plavix held, neurosurgery consulted.  Asymptomatic.    Marland Kitchen 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[place]) 02/2018   on Lovenox lifelong since he failed PO Eliquis   Past Surgical History:  Procedure Laterality Date  . ARM SKIN LESION BIOPSY / EXCISION Left 09/02/2015  . CATARACT EXTRACTION W/ INTRAOCULAR LENS  IMPLANT, BILATERAL Bilateral   . CHOLECYSTECTOMY OPEN    . CORNEAL TRANSPLANT Bilateral    "one at [facility]; one at [facility]"  . CORONARY ANGIOPLASTY WITH STENT PLACEMENT    . CORONARY ARTERY BYPASS GRAFT  1999  . DESCENDING AORTIC ANEURYSM REPAIR W/ STENT     DE stent ostium into the right radial free graft at OM-, 02-2007  . ESOPHAGOGASTRODUODENOSCOPY N/A 11/09/2014   Procedure: ESOPHAGOGASTRODUODENOSCOPY (EGD);  Surgeon:  [name], MD;  Location: [facility] ENDOSCOPY;  Service: Endoscopy;  Laterality: N/A;  . INGUINAL HERNIA REPAIR    . JOINT REPLACEMENT    . LEFT HEART CATH AND CORS/GRAFTS ANGIOGRAPHY N/A 06/27/2017   Procedure: LEFT HEART CATH AND CORS/GRAFTS ANGIOGRAPHY;  Surgeon: [name], MD;  Location: [place] CV LAB;  Service: Cardiovascular;  Laterality: N/A;  . NASAL SINUS SURGERY    . SHOULDER OPEN ROTATOR CUFF REPAIR Right   . TOTAL KNEE ARTHROPLASTY Bilateral    Current Outpatient Medications on File Prior to Visit  Medication Sig Dispense Refill  . acetaminophen (TYLENOL) 325 MG tablet Take 1-2 tablets (325-650 mg total) by mouth every 4 (four) hours as needed for mild pain.    Marland Kitchen ALPRAZolam (XANAX) 0.5 MG tablet Take 0.5  tablets (0.25 mg total) by mouth 3 (three) times daily as needed for anxiety. 30 tablet 0  . Control Gel Formula Dressing (DUODERM CGF DRESSING) MISC Apply 1 each topically daily. 12 each 3  . diclofenac sodium (VOLTAREN) 1 % GEL Apply 2 g topically 4 (four) times daily. 350 g 1  . FLUoxetine (PROZAC) 20 MG capsule Take 1 capsule (20 mg total) by mouth daily. 90 capsule 3  . furosemide (LASIX) 20 MG tablet TAKE 1 TABLET (20 MG TOTAL) BY MOUTH DAILY. MAY TAKE AND EXTRA 20 MG TABLET IF INCREASED SHORTNESS OF BREATH OR WEIGHT GAIN. Please make overdue appt with Dr. Irish Lack. 1st attempt (Patient taking differently: TAKE 1 TABLET (20 MG TOTAL) BY MOUTH DAILY. TAKE 2 TABLETS DAILY EVERY Monday, Wednesday, AND Friday.) 45 tablet 0  . hydrOXYzine (ATARAX/VISTARIL) 10 MG tablet Take 1 tablet (10 mg total) by mouth at bedtime. 30 tablet 0  . Lifitegrast (XIIDRA) 5 % SOLN Place 1-2 drops into both eyes daily.     . magnesium oxide (MAG-OX) 400 (241.3 Mg) MG tablet Take 1 tablet (400 mg total) by mouth 2 (two) times daily. 180 tablet 3  . metoprolol tartrate (LOPRESSOR) 25 MG tablet TAKE ONE-HALF TABLET (12.5 MG TOTAL) BY MOUTH 2 (TWO) TIMES DAILY. 90 tablet 3  . nitroGLYCERIN (NITROSTAT) 0.4 MG SL tablet Place 1 tablet (0.4 mg total) under the tongue every 5 (five) minutes as needed for chest pain. 25 tablet 2  . pantoprazole (PROTONIX) 40 MG tablet TAKE ONE (1) TABLET BY MOUTH EVERY DAY 90 tablet 3  . polyethylene glycol (MIRALAX / GLYCOLAX) 17 g packet Take 17 g by mouth daily as needed for mild constipation. 14 each 0  . Rivaroxaban (XARELTO) 15 MG TABS tablet TAKE 1 TABLET (15 MG TOTAL) BY MOUTH DAILY WITH SUPPER. 90 tablet 1  . rosuvastatin (CRESTOR) 5 MG tablet Take 1 tablet (5 mg total) by mouth daily. 90 tablet 1  . silodosin (RAPAFLO) 8 MG CAPS capsule Take 8 mg by mouth daily with breakfast.    . tacrolimus (PROTOPIC) 0.1 % ointment Apply 1 application topically 2 (two) times daily.     No current  facility-administered medications on file prior to visit.   Allergies  Allergen Reactions  . Feldene [Piroxicam] Other (See Comments)    REACTION: Blisters  . Neomycin-Polymyxin-Hc Itching  . Statins Other (See Comments)    Myalgias  . Doxycycline Other (See Comments)    REACTION:  unknown  . Latex Rash  . Tequin Anxiety  . Valium Other (See Comments)    REACTION:  unknown   Social History   Socioeconomic History  . Marital status: Widowed    Spouse name:  Not on file  . Number of children: Not on file  . Years of education: Not on file  . Highest education level: Not on file  Occupational History  . Not on file  Tobacco Use  . Smoking status: Passive Smoke Exposure - Never Smoker  . Smokeless tobacco: Former Systems developer    Types: Chew  . Tobacco comment: "quit chewing in the 1960s"  Vaping Use  . Vaping Use: Never used  Substance and Sexual Activity  . Alcohol use: No  . Drug use: No  . Sexual activity: Never  Other Topics Concern  . Not on file  Social History Narrative   01-05-18 Unable to ask abuse questions family with him today.   11-012-19 Unable to ask abuse questions family with him today.   Social Determinants of Health   Financial Resource Strain: Not on file  Food Insecurity: Not on file  Transportation Needs: Not on file  Physical Activity: Not on file  Stress: Not on file  Social Connections: Not on file  Intimate Partner Violence: Not on file      Review of Systems  All other systems reviewed and are negative.      Objective:   Physical Exam Vitals reviewed.  Constitutional:      Appearance: Normal appearance.  Cardiovascular:     Rate and Rhythm: Normal rate and regular rhythm.  Pulmonary:     Effort: Pulmonary effort is normal.     Breath sounds: Normal breath sounds.  Neurological:     Mental Status: He is alert.   Right-sided hemiparesis with weakness in his right leg.  Weakness with knee flexion and extension and ankle flexion and  extension.  Ataxia in the right upper arm and right lower leg.         Assessment & Plan:  Lower urinary tract symptoms (LUTS) - Plan: Urinalysis, Routine w reflex microscopic, BASIC METABOLIC PANEL WITH GFR  Differential diagnosis includes urinary tract infection, acute prostatitis, BPH, dehydration due to diuretic excess.  Check a BMP to evaluate for any possible prerenal azotemia that would require [place] to discontinue his fluid pill.  I will start the patient on Bactrim double strength tablets twice daily for 10 days.  If symptoms or not improving, consider adding medication for BPH however the sudden change makes me suspect prostatitis.  There is no evidence of a urinary tract infection.

## 2020-06-23 LAB — LIPID PANEL
Chol/HDL Ratio: 2.6 ratio (ref 0.0–5.0)
Cholesterol, Total: 91 mg/dL — ABNORMAL LOW (ref 100–199)
HDL: 35 mg/dL — ABNORMAL LOW (ref >39)
LDL Chol Calc (NIH): 41 mg/dL (ref 0–99)
Triglycerides: 66 mg/dL (ref 0–149)
VLDL Cholesterol Cal: 15 mg/dL (ref 5–40)

## 2020-06-23 LAB — CBC
Hematocrit: 35.4 % — ABNORMAL LOW (ref 37.5–51.0)
Hemoglobin: 11.7 g/dL — ABNORMAL LOW (ref 13.0–17.7)
MCH: 30.4 pg (ref 26.6–33.0)
MCHC: 33.1 g/dL (ref 31.5–35.7)
MCV: 92 fL (ref 79–97)
Platelets: 149 10*3/uL — ABNORMAL LOW (ref 150–450)
RBC: 3.85 x10E6/uL — ABNORMAL LOW (ref 4.14–5.80)
RDW: 13.5 % (ref 11.6–15.4)
WBC: 6.4 10*3/uL (ref 3.4–10.8)

## 2020-06-23 LAB — COMPREHENSIVE METABOLIC PANEL
ALT: 10 IU/L (ref 0–44)
AST: 19 IU/L (ref 0–40)
Albumin/Globulin Ratio: 1.6 (ref 1.2–2.2)
Albumin: 3.9 g/dL (ref 3.5–4.6)
Alkaline Phosphatase: 132 IU/L — ABNORMAL HIGH (ref 44–121)
BUN/Creatinine Ratio: 18 (ref 10–24)
BUN: 24 mg/dL (ref 10–36)
Bilirubin Total: 0.7 mg/dL (ref 0.0–1.2)
CO2: 21 mmol/L (ref 20–29)
Calcium: 9 mg/dL (ref 8.6–10.2)
Chloride: 104 mmol/L (ref 96–106)
Creatinine, Ser: 1.32 mg/dL — ABNORMAL HIGH (ref 0.76–1.27)
Globulin, Total: 2.5 g/dL (ref 1.5–4.5)
Glucose: 108 mg/dL — ABNORMAL HIGH (ref 65–99)
Potassium: 4.4 mmol/L (ref 3.5–5.2)
Sodium: 138 mmol/L (ref 134–144)
Total Protein: 6.4 g/dL (ref 6.0–8.5)
eGFR: 51 mL/min/{1.73_m2} — ABNORMAL LOW (ref >59)

## 2020-06-23 LAB — BASIC METABOLIC PANEL WITH GFR
BUN/Creatinine Ratio: 20 (calc) (ref 6–22)
BUN: 26 mg/dL — ABNORMAL HIGH (ref 7–25)
CO2: 22 mmol/L (ref 20–32)
Calcium: 8.8 mg/dL (ref 8.6–10.3)
Chloride: 105 mmol/L (ref 98–110)
Creat: 1.33 mg/dL — ABNORMAL HIGH (ref 0.70–1.11)
GFR, Est African American: 54 mL/min/{1.73_m2} — ABNORMAL LOW (ref >=60)
GFR, Est Non African American: 47 mL/min/{1.73_m2} — ABNORMAL LOW (ref >=60)
Glucose, Bld: 93 mg/dL (ref 65–99)
Potassium: 4 mmol/L (ref 3.5–5.3)
Sodium: 139 mmol/L (ref 135–146)

## 2020-06-23 LAB — TSH: TSH: 2.85 u[IU]/mL (ref 0.450–4.500)

## 2020-06-26 DIAGNOSIS — R3915 Urgency of urination: Secondary | ICD-10-CM | POA: Diagnosis not present

## 2020-07-06 ENCOUNTER — Encounter: Payer: Medicare HMO | Attending: Physical Medicine & Rehabilitation | Admitting: Physical Medicine & Rehabilitation

## 2020-07-06 ENCOUNTER — Encounter: Payer: Self-pay | Admitting: Physical Medicine & Rehabilitation

## 2020-07-06 ENCOUNTER — Other Ambulatory Visit: Payer: Self-pay

## 2020-07-06 VITALS — BP 120/64 | HR 56 | Temp 98.0°F | Ht 64.0 in | Wt 169.0 lb

## 2020-07-06 DIAGNOSIS — M25511 Pain in right shoulder: Secondary | ICD-10-CM | POA: Diagnosis not present

## 2020-07-06 DIAGNOSIS — G8929 Other chronic pain: Secondary | ICD-10-CM

## 2020-07-06 DIAGNOSIS — F05 Delirium due to known physiological condition: Secondary | ICD-10-CM | POA: Diagnosis not present

## 2020-07-06 DIAGNOSIS — I610 Nontraumatic intracerebral hemorrhage in hemisphere, subcortical: Secondary | ICD-10-CM

## 2020-07-06 DIAGNOSIS — R269 Unspecified abnormalities of gait and mobility: Secondary | ICD-10-CM

## 2020-07-06 DIAGNOSIS — R350 Frequency of micturition: Secondary | ICD-10-CM

## 2020-07-06 NOTE — Progress Notes (Addendum)
Subjective:    Patient ID: Evan [name], male    DOB: [birth year], 87 y.o.   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  Daughter provides history due HOH. Last clinic visit on 01/06/2020.  Since that time, pt states he is doing HEP. Denies falls. He is using wheelchair and cane for short distance, balance somewhat improving. Urinary freq was improving after acupuncture treatment, but got worse again after his lasix was increased.  He notes some pain with shoulder movement - notes laying on it all the time. Sundowning is worse, night time sleep is getting worse.   Pain Inventory Average Pain 1 Pain Right Now 0 My pain is constant and aching  In the last 24 hours, has pain interfered with the following? General activity 1 Relation with others 0 Enjoyment of life 0 What TIME of day is your pain at its worst? evening Sleep (in general) Good  Pain is worse with: per daughter maybe diet. Pain improves with: Voltaren  Relief from Meds: 5  Mobility walk with assistance use a cane ability to climb steps?  no do you drive?  no use a wheelchair needs help with transfers Do you have any goals in this area?  yes  Function retired I need assistance with the following:  feeding, dressing, bathing, toileting, meal prep, household duties and shopping Do you have any goals in this area?  yes  Neuro/Psych weakness numbness tremor trouble walking confusion anxiety  Prior Studies Any changes since last visit?  no  Physicians involved in your care Any changes since last visit?  no   Family History  Problem Relation Age of Onset  . Arthritis-Osteo Sister   . Heart attack Brother   . Heart attack Other   . Cancer Neg Hx    Social History    Socioeconomic History  . Marital status: Widowed    Spouse name: Not on file  . Number of children: Not on file  . Years of education: Not on file  . Highest education level: Not on file  Occupational History  . Not on file  Tobacco Use  . Smoking status: Passive Smoke Exposure - Never Smoker  . Smokeless tobacco: Former Systems developer    Types: Chew  . Tobacco comment: "quit chewing in the 1960s"  Vaping Use  . Vaping Use: Never used  Substance and Sexual Activity  . Alcohol use: No  . Drug use: No  . Sexual activity: Never  Other Topics Concern  . Not on file  Social History Narrative   01-05-18 Unable to ask abuse questions family with him today.   11-012-19 Unable to ask abuse questions family with him today.   Social Determinants of Health   Financial Resource Strain: Not on file  Food Insecurity: Not on file  Transportation Needs: Not on file  Physical Activity: Not on file  Stress: Not on file  Social Connections: Not on file   Past Surgical History:  Procedure Laterality Date  . ARM SKIN LESION BIOPSY / EXCISION Left 09/02/2015  . CATARACT EXTRACTION W/ INTRAOCULAR LENS  IMPLANT, BILATERAL Bilateral   . CHOLECYSTECTOMY OPEN    . CORNEAL TRANSPLANT Bilateral    "one at [facility]; one at [facility]"  . CORONARY ANGIOPLASTY WITH STENT PLACEMENT    . CORONARY ARTERY BYPASS GRAFT  1999  . DESCENDING AORTIC ANEURYSM REPAIR W/ STENT     DE stent ostium into the right radial free graft at OM-, 02-2007  . ESOPHAGOGASTRODUODENOSCOPY N/A 11/09/2014   Procedure: ESOPHAGOGASTRODUODENOSCOPY (EGD);  Surgeon: [name], MD;  Location: [facility] ENDOSCOPY;  Service: Endoscopy;  Laterality: N/A;  . INGUINAL HERNIA REPAIR    . JOINT REPLACEMENT    . LEFT HEART CATH AND CORS/GRAFTS ANGIOGRAPHY N/A 06/27/2017   Procedure: LEFT HEART CATH AND CORS/GRAFTS ANGIOGRAPHY;  Surgeon: [name], MD;  Location: [place] CV LAB;  Service: Cardiovascular;  Laterality: N/A;  . NASAL SINUS SURGERY    .  SHOULDER OPEN ROTATOR CUFF REPAIR Right   . TOTAL KNEE ARTHROPLASTY Bilateral    Past Medical History:  Diagnosis Date  . Adenocarcinoma of right lung ([place]) 01/06/2016  . Anginal chest pain at rest [facility])    Chronic non, controlled on Lorazepam  . Anxiety   . Arthritis    "all over"   . BPH (benign prostatic hyperplasia)   . CAD (coronary artery disease)   . Chronic lower back pain   . Complication of anesthesia    "problems making water afterwards"  . Depression   . DJD (degenerative joint disease)   . Esophageal ulcer without bleeding    bid ppi indefinitely  . Family history of adverse reaction to anesthesia    "children w/PONV"  . GERD (gastroesophageal reflux disease)   . Headache    "couple/week maybe" (09/04/2015)  . Hemochromatosis    Possible, elevated iron stores, hook-like osteophytes on hand films, normal LFTs  . Hepatitis    "yellow jaundice as a baby"  . History of ASCVD    MULTIVESSEL  . Hyperlipidemia   . Hypertension   . Osteoarthritis   . Pneumonia    "several times; got a little now" (09/04/2015)  . Rheumatoid arthritis ([place])   . Subdural hematoma (HCC)    small after fall 09/2016-plavix held, neurosurgery consulted.  Asymptomatic.    Marland Kitchen 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[place]) 02/2018   on Lovenox lifelong since he failed PO Eliquis   BP 120/64   Pulse (!) 56   Temp 98 F (36.7 C)   Ht 5\' 4"  (1.626 m)   Wt 169 lb (76.7 kg) Comment: per wife - pt in wheel chair & brace  SpO2 96%   BMI 29.01 kg/m   Opioid Risk Score:   Fall Risk Score:  `1  Depression screen PHQ 2/9  Depression screen [facility] 2/9 07/06/2020 07/04/2019 08/20/2018  Decreased Interest 0 1 0  Down, Depressed, Hopeless 0 1 2  PHQ - 2 Score 0 2 2  Altered sleeping - - 1  Tired, decreased energy - - 2  Change in appetite - - 0  Feeling bad or failure about yourself  - - 2  Trouble concentrating - - 0  Moving slowly or fidgety/restless - - 0  Suicidal thoughts - - 1  PHQ-9 Score - - 8  Difficult doing  work/chores - - Somewhat difficult  Some recent data might be hidden    Review of Systems  Constitutional: Negative.   HENT: Negative.   Eyes: Negative.   Respiratory: Negative.   Cardiovascular: Negative.   Gastrointestinal: Negative.   Endocrine: Negative.   Genitourinary: Positive for difficulty urinating and frequency. Negative for dysuria.  Musculoskeletal: Positive for arthralgias, gait problem and myalgias.  Skin: Negative for rash.  Allergic/Immunologic: Negative.   Neurological: Positive for tremors, weakness and numbness.  Hematological: Bruises/bleeds easily.  Xarelto  Psychiatric/Behavioral: The patient is nervous/anxious.        Anexiety  All other systems reviewed and are negative.     Objective:   Physical Exam  Constitutional: No distress . Vital signs reviewed. HENT: Normocephalic.  Atraumatic. Eyes: EOMI. No discharge. Cardiovascular: No JVD.   Respiratory: Normal effort.  No stridor.   GI: Non-distended.   Skin: Warm and dry.  Intact. Psych: Normal mood.  Normal behavior. Musc: No edema in extremities.  No tenderness in extremities. 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  1. 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   PT/OT reordered  Released by Neuro  2. Gait abnormality  Continue wheelchair for safety, now also using cane for short distances, improving  Continue AFO with improvement  Continue home exercise program  PT ordered  Improving ambulation  3. Urinary frequency - mainly at night  Cont follow up with urology, undergoing acupuncture treatment with improvement  Was improving until  increase in [place]  4. Right shoulder pain  Improved after steroid injection, pt complains of pain, but per family, does not complain  Cont Voltaren gel for shoulder prn  Will consider repeat injection if necessary  5. Family describes sundowning   Educated on sleep/wake cycle and environmental modifications   Encouraged Melatonin   6. Gout  2nd MCP joint in left hand  1st toe in LLE  Voltaren gel

## 2020-07-06 NOTE — Addendum Note (Signed)
Addended by: Delice Lesch A on: 07/06/2020 11:34 AM   Modules accepted: Orders

## 2020-07-21 ENCOUNTER — Other Ambulatory Visit (HOSPITAL_COMMUNITY): Payer: Self-pay

## 2020-07-21 ENCOUNTER — Other Ambulatory Visit: Payer: Self-pay | Admitting: Interventional Cardiology

## 2020-07-21 MED ORDER — SILODOSIN 8 MG PO CAPS
8.0000 mg | ORAL_CAPSULE | Freq: Every day | ORAL | 2 refills | Status: DC
  Filled 2020-07-21: qty 30, 30d supply, fill #0
  Filled 2020-08-10 – 2020-08-17 (×2): qty 30, 30d supply, fill #1
  Filled 2020-09-12: qty 30, 30d supply, fill #2
  Filled 2020-10-11: qty 30, 30d supply, fill #3

## 2020-07-21 MED ORDER — SILODOSIN 8 MG PO CAPS: 8.0000 mg | ORAL_CAPSULE | Freq: Every day | ORAL | 11 refills | Status: AC

## 2020-07-24 DIAGNOSIS — R3915 Urgency of urination: Secondary | ICD-10-CM | POA: Diagnosis not present

## 2020-07-28 ENCOUNTER — Encounter: Payer: Self-pay | Admitting: Family Medicine

## 2020-07-28 ENCOUNTER — Ambulatory Visit (INDEPENDENT_AMBULATORY_CARE_PROVIDER_SITE_OTHER): Payer: Medicare HMO | Admitting: Family Medicine

## 2020-07-28 ENCOUNTER — Other Ambulatory Visit: Payer: Self-pay

## 2020-07-28 VITALS — BP 118/60 | HR 66 | Temp 98.2°F | Resp 14

## 2020-07-28 DIAGNOSIS — H6123 Impacted cerumen, bilateral: Secondary | ICD-10-CM

## 2020-07-28 NOTE — Progress Notes (Signed)
Subjective:    Patient ID: Evan [name], male    DOB: 01-09-1928, 87 y.o.   MRN: [id number]  HPI  Patient is a very pleasant 87 year old Caucasian male who presents today with bilateral hearing loss due to cerumen impactions.  He is requesting that we remove the cerumen impaction with irrigation/lavage if possible.  Otherwise he is doing well. Past Medical History:  Diagnosis Date  . Adenocarcinoma of right lung ([place]) 01/06/2016  . Anginal chest pain at rest [facility])    Chronic non, controlled on Lorazepam  . Anxiety   . Arthritis    "all over"   . BPH (benign prostatic hyperplasia)   . CAD (coronary artery disease)   . Chronic lower back pain   . Complication of anesthesia    "problems making water afterwards"  . Depression   . DJD (degenerative joint disease)   . Esophageal ulcer without bleeding    bid ppi indefinitely  . Family history of adverse reaction to anesthesia    "children w/PONV"  . GERD (gastroesophageal reflux disease)   . Headache    "couple/week maybe" (09/04/2015)  . Hemochromatosis    Possible, elevated iron stores, hook-like osteophytes on hand films, normal LFTs  . Hepatitis    "yellow jaundice as a baby"  . History of ASCVD    MULTIVESSEL  . Hyperlipidemia   . Hypertension   . Osteoarthritis   . Pneumonia    "several times; got a little now" (09/04/2015)  . Rheumatoid arthritis ([place])   . Subdural hematoma (HCC)    small after fall 09/2016-plavix held, neurosurgery consulted.  Asymptomatic.    Marland Kitchen 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[place]) 02/2018   on Lovenox lifelong since he failed PO Eliquis   Past Surgical History:  Procedure Laterality Date  . ARM SKIN LESION BIOPSY / EXCISION Left 09/02/2015  . CATARACT EXTRACTION W/ INTRAOCULAR LENS  IMPLANT, BILATERAL Bilateral   . CHOLECYSTECTOMY OPEN    . CORNEAL TRANSPLANT Bilateral    "one at [facility]; one at [facility]"  . CORONARY ANGIOPLASTY WITH STENT PLACEMENT    . CORONARY ARTERY BYPASS GRAFT  1999  . DESCENDING AORTIC ANEURYSM  REPAIR W/ STENT     DE stent ostium into the right radial free graft at OM-, 02-2007  . ESOPHAGOGASTRODUODENOSCOPY N/A 11/09/2014   Procedure: ESOPHAGOGASTRODUODENOSCOPY (EGD);  Surgeon: [name], MD;  Location: [facility] ENDOSCOPY;  Service: Endoscopy;  Laterality: N/A;  . INGUINAL HERNIA REPAIR    . JOINT REPLACEMENT    . LEFT HEART CATH AND CORS/GRAFTS ANGIOGRAPHY N/A 06/27/2017   Procedure: LEFT HEART CATH AND CORS/GRAFTS ANGIOGRAPHY;  Surgeon: [name], MD;  Location: [place] CV LAB;  Service: Cardiovascular;  Laterality: N/A;  . NASAL SINUS SURGERY    . SHOULDER OPEN ROTATOR CUFF REPAIR Right   . TOTAL KNEE ARTHROPLASTY Bilateral    Current Outpatient Medications on File Prior to Visit  Medication Sig Dispense Refill  . acetaminophen (TYLENOL) 325 MG tablet Take 1-2 tablets (325-650 mg total) by mouth every 4 (four) hours as needed for mild pain.    Marland Kitchen ALPRAZolam (XANAX) 0.5 MG tablet Take 0.5 tablets (0.25 mg total) by mouth 3 (three) times daily as needed for anxiety. 30 tablet 0  . FLUoxetine (PROZAC) 20 MG capsule Take 1 capsule (20 mg total) by mouth daily. 90 capsule 3  . furosemide (LASIX) 20 MG tablet TAKE 1 TABLET DAILY. MAY TAKE AN EXTRA 20 MG TABLET IF INCREASED SHORTNESS OF BREATH OR WEIGHT GAIN. 45 tablet  3  . hydrOXYzine (ATARAX/VISTARIL) 10 MG tablet Take 1 tablet (10 mg total) by mouth at bedtime. 30 tablet 0  . Lifitegrast (XIIDRA) 5 % SOLN Place 1-2 drops into both eyes daily.     . magnesium oxide (MAG-OX) 400 (241.3 Mg) MG tablet TAKE 1 TABLET (400 MG TOTAL) BY MOUTH 2 (TWO) TIMES DAILY 180 tablet 2  . metoprolol tartrate (LOPRESSOR) 25 MG tablet TAKE ONE-HALF TABLET (12.5 MG TOTAL) BY MOUTH 2 (TWO) TIMES DAILY. 90 tablet 3  . nitroGLYCERIN (NITROSTAT) 0.4 MG SL tablet Place 1 tablet (0.4 mg total) under the tongue every 5 (five) minutes as needed for chest pain. 25 tablet 2  . pantoprazole (PROTONIX) 40 MG tablet TAKE ONE (1) TABLET BY MOUTH EVERY DAY 90 tablet 3   . polyethylene glycol (MIRALAX / GLYCOLAX) 17 g packet Take 17 g by mouth daily as needed for mild constipation. 14 each 0  . Rivaroxaban (XARELTO) 15 MG TABS tablet TAKE 1 TABLET (15 MG TOTAL) BY MOUTH DAILY WITH SUPPER. 90 tablet 1  . rosuvastatin (CRESTOR) 5 MG tablet Take 1 tablet (5 mg total) by mouth daily. 90 tablet 1  . silodosin (RAPAFLO) 8 MG CAPS capsule Take 8 mg by mouth daily with breakfast.    . silodosin (RAPAFLO) 8 MG CAPS capsule Take 1 capsule (8 mg total) by mouth daily. 90 capsule 2  . sulfamethoxazole-trimethoprim (BACTRIM DS) 800-160 MG tablet TAKE 1 TABLET BY MOUTH 2 (TWO) TIMES DAILY. 20 tablet 0  . tacrolimus (PROTOPIC) 0.1 % ointment Apply 1 application topically 2 (two) times daily.     No current facility-administered medications on file prior to visit.   Allergies  Allergen Reactions  . Feldene [Piroxicam] Other (See Comments)    REACTION: Blisters  . Neomycin-Polymyxin-Hc Itching  . Statins Other (See Comments)    Myalgias  . Doxycycline Other (See Comments)    REACTION:  unknown  . Latex Rash  . Tequin Anxiety  . Valium Other (See Comments)    REACTION:  unknown   Social History   Socioeconomic History  . Marital status: Widowed    Spouse name: Not on file  . Number of children: Not on file  . Years of education: Not on file  . Highest education level: Not on file  Occupational History  . Not on file  Tobacco Use  . Smoking status: Passive Smoke Exposure - Never Smoker  . Smokeless tobacco: Former Systems developer    Types: Chew  . Tobacco comment: "quit chewing in the 1960s"  Vaping Use  . Vaping Use: Never used  Substance and Sexual Activity  . Alcohol use: No  . Drug use: No  . Sexual activity: Never  Other Topics Concern  . Not on file  Social History Narrative   01-05-18 Unable to ask abuse questions family with him today.   11-012-19 Unable to ask abuse questions family with him today.   Social Determinants of Health   Financial  Resource Strain: Not on file  Food Insecurity: Not on file  Transportation Needs: Not on file  Physical Activity: Not on file  Stress: Not on file  Social Connections: Not on file  Intimate Partner Violence: Not on file      Review of Systems  All other systems reviewed and are negative.      Objective:   Physical Exam Vitals reviewed.  Constitutional:      Appearance: Normal appearance.  HENT:     Right Ear: There is  impacted cerumen.     Left Ear: There is impacted cerumen.  Cardiovascular:     Rate and Rhythm: Normal rate and regular rhythm.  Pulmonary:     Effort: Pulmonary effort is normal.     Breath sounds: Normal breath sounds.  Neurological:     Mental Status: He is alert.   Right-sided hemiparesis with weakness in his right leg.  Weakness with knee flexion and extension and ankle flexion and extension.  Ataxia in the right upper arm and right lower leg.         Assessment & Plan:  Bilateral impacted cerumen  Patient has bilateral cerumen impactions that were removed with irrigation and lavage without complication.

## 2020-08-10 ENCOUNTER — Other Ambulatory Visit (HOSPITAL_COMMUNITY): Payer: Self-pay

## 2020-08-17 ENCOUNTER — Other Ambulatory Visit (HOSPITAL_COMMUNITY): Payer: Self-pay

## 2020-08-19 ENCOUNTER — Ambulatory Visit (INDEPENDENT_AMBULATORY_CARE_PROVIDER_SITE_OTHER): Payer: Medicare HMO | Admitting: Otolaryngology

## 2020-08-20 DIAGNOSIS — R3915 Urgency of urination: Secondary | ICD-10-CM | POA: Diagnosis not present

## 2020-08-24 ENCOUNTER — Other Ambulatory Visit (HOSPITAL_COMMUNITY): Payer: Self-pay

## 2020-08-24 DIAGNOSIS — R311 Benign essential microscopic hematuria: Secondary | ICD-10-CM | POA: Diagnosis not present

## 2020-08-24 DIAGNOSIS — R3915 Urgency of urination: Secondary | ICD-10-CM | POA: Diagnosis not present

## 2020-08-24 DIAGNOSIS — N3000 Acute cystitis without hematuria: Secondary | ICD-10-CM | POA: Diagnosis not present

## 2020-08-24 DIAGNOSIS — N139 Obstructive and reflux uropathy, unspecified: Secondary | ICD-10-CM | POA: Diagnosis not present

## 2020-08-25 ENCOUNTER — Ambulatory Visit: Payer: Medicare HMO | Attending: Physical Medicine & Rehabilitation

## 2020-08-25 ENCOUNTER — Other Ambulatory Visit: Payer: Self-pay

## 2020-08-25 ENCOUNTER — Other Ambulatory Visit (HOSPITAL_COMMUNITY): Payer: Self-pay

## 2020-08-25 ENCOUNTER — Encounter: Payer: Self-pay | Admitting: Occupational Therapy

## 2020-08-25 ENCOUNTER — Ambulatory Visit: Payer: Medicare HMO | Admitting: Occupational Therapy

## 2020-08-25 DIAGNOSIS — R2681 Unsteadiness on feet: Secondary | ICD-10-CM | POA: Diagnosis not present

## 2020-08-25 DIAGNOSIS — R278 Other lack of coordination: Secondary | ICD-10-CM

## 2020-08-25 DIAGNOSIS — R29818 Other symptoms and signs involving the nervous system: Secondary | ICD-10-CM | POA: Diagnosis not present

## 2020-08-25 DIAGNOSIS — R262 Difficulty in walking, not elsewhere classified: Secondary | ICD-10-CM | POA: Diagnosis not present

## 2020-08-25 DIAGNOSIS — R29898 Other symptoms and signs involving the musculoskeletal system: Secondary | ICD-10-CM | POA: Diagnosis not present

## 2020-08-25 DIAGNOSIS — R2689 Other abnormalities of gait and mobility: Secondary | ICD-10-CM

## 2020-08-25 DIAGNOSIS — M6281 Muscle weakness (generalized): Secondary | ICD-10-CM | POA: Diagnosis not present

## 2020-08-25 DIAGNOSIS — R270 Ataxia, unspecified: Secondary | ICD-10-CM | POA: Diagnosis not present

## 2020-08-25 MED ORDER — CIPROFLOXACIN HCL 250 MG PO TABS
ORAL_TABLET | ORAL | 0 refills | Status: DC
  Filled 2020-08-25: qty 14, 7d supply, fill #0

## 2020-08-25 NOTE — Therapy (Signed)
[facility] [address] [place], [place], [address] Phone: [phone]   Fax:  [phone]  Physical Therapy Evaluation  Patient Details  Name: Evan [name] MRN: [id number] Date of Birth: 04/13/1927 Referring Provider (PT): Delice Lesch, MD   Encounter Date: 08/25/2020   PT End of Session - 08/25/20 1143    Visit Number 1    Number of Visits 13    Date for PT Re-Evaluation 10/24/20   POC for 6 weeks, Cert for 60 days   Authorization Type [employer] (10th Visit PN)    Authorization Time Period Awaiting Authorization Approval    PT Start Time 1145    PT Stop Time 1228    PT Time Calculation (min) 43 min    Equipment Utilized During Treatment Gait belt    Activity Tolerance Patient tolerated treatment well;Patient limited by fatigue    Behavior During Therapy [facility] for tasks assessed/performed           Past Medical History:  Diagnosis Date  . Adenocarcinoma of right lung ([place]) 01/06/2016  . Anginal chest pain at rest [facility]    Chronic non, controlled on Lorazepam  . Anxiety   . Arthritis    "all over"   . BPH (benign prostatic hyperplasia)   . CAD (coronary artery disease)   . Chronic lower back pain   . Complication of anesthesia    "problems making water afterwards"  . Depression   . DJD (degenerative joint disease)   . Esophageal ulcer without bleeding    bid ppi indefinitely  . Family history of adverse reaction to anesthesia    "children w/PONV"  . GERD (gastroesophageal reflux disease)   . Headache    "couple/week maybe" (09/04/2015)  . Hemochromatosis    Possible, elevated iron stores, hook-like osteophytes on hand films, normal LFTs  . Hepatitis    "yellow jaundice as a baby"  . History of ASCVD    MULTIVESSEL  . Hyperlipidemia   . Hypertension   . Osteoarthritis   . Pneumonia    "several times; got a little now" (09/04/2015)  . Rheumatoid arthritis ([place])   . Subdural hematoma ([facility])     small after fall 09/2016-plavix held, neurosurgery consulted.  Asymptomatic.    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 ARM SKIN LESION BIOPSY / EXCISION Left 09/02/2015  . CATARACT EXTRACTION W/ INTRAOCULAR LENS  IMPLANT, BILATERAL Bilateral   . CHOLECYSTECTOMY OPEN    . CORNEAL TRANSPLANT Bilateral    "one at [facility]; one at [facility]"  . CORONARY ANGIOPLASTY WITH STENT PLACEMENT    . CORONARY ARTERY BYPASS GRAFT  1999  . DESCENDING AORTIC ANEURYSM REPAIR W/ STENT     DE stent ostium into the right radial free graft at OM-, 02-2007  . ESOPHAGOGASTRODUODENOSCOPY N/A 11/09/2014   Procedure: ESOPHAGOGASTRODUODENOSCOPY (EGD);  Surgeon: [name], MD;  Location: [facility] ENDOSCOPY;  Service: Endoscopy;  Laterality: N/A;  . INGUINAL HERNIA REPAIR    . JOINT REPLACEMENT    . LEFT HEART CATH AND CORS/GRAFTS ANGIOGRAPHY N/A 06/27/2017   Procedure: LEFT HEART CATH AND CORS/GRAFTS ANGIOGRAPHY;  Surgeon: [name], MD;  Location: [facility] CV LAB;  Service: Cardiovascular;  Laterality: N/A;  . NASAL SINUS SURGERY    . SHOULDER OPEN ROTATOR CUFF REPAIR Right   . TOTAL KNEE ARTHROPLASTY Bilateral     There were  no vitals filed for this visit.    Subjective Assessment - 08/25/20 1148    Subjective Patient is accompaied by daughter and son. Patient primary means of mobility is manual w/c. Patient has been using a LBQC for short distance in the home approximately 30-50 ft, 2x/day. Patient is accompanied by family member at all times. No falls to report. Currently not amublating to the bathroom, mostly uses a bedside commode. Daughter reports the right knee seems to buckle/ "give out" on him at times.    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 No/denies              [facility] PT Assessment - 08/25/20 1153      Assessment   Medical Diagnosis Thalamic hemorrhage    Referring Provider (PT) Delice Lesch, MD    Onset Date/Surgical Date --   CVA 07/2018   Hand Dominance Right    Prior Therapy Last PT 10/2019 [facility])      Precautions   Precautions Fall    Required Braces or Orthoses Other Brace/Splint    Other Brace/Splint currently wears AFO on RLE (beleives utilized [employer])      Restrictions   Weight Bearing Restrictions No      Balance Screen   Has the patient fallen in the past 6 months No    Has the patient had a decrease in activity level because of a fear of falling?  No   daughter reports a fear of falling   Is the patient reluctant to leave their home because of a fear of falling?  No      Home Environment   Living Environment Private residence    Living Arrangements Children   2 sons and Daughter   Available Help at Discharge Family    Type of [place] entrance    [place] to live on main level with bedroom/bathroom   do not go into [facility] bed;Wheelchair - manual;Shower seat;Bedside commode;Cane - quad      Prior Function   Level of Independence Needs assistance with ADLs;Needs assistance with gait      Cognition   Overall Cognitive Status History of cognitive impairments - at baseline      Sensation   Light Touch Impaired by gross assessment    Additional Comments decreased sensation on RLE      Coordination   Gross Motor Movements are Fluid and Coordinated No    Fine Motor Movements are Fluid and Coordinated No    Coordination and Movement Description generally uncoordinated on RLE/RUE      Tone   Assessment Location Right Lower Extremity      ROM / Strength   AROM / PROM  / Strength AROM;Strength      AROM   Overall AROM  Deficits    Overall AROM Comments general AROM deficits in RLE due to weakness      Strength   Overall Strength Deficits    Strength Assessment Site Hip;Knee;Ankle    Right/Left Hip Right;Left    Right Hip Flexion 3-/5    Right Hip ABduction 2+/5    Right  Hip ADduction 2+/5    Left Hip Flexion 4/5    Left Hip ABduction 3+/5    Left Hip ADduction 3+/5    Right/Left Knee Right;Left    Right Knee Flexion 3/5    Right Knee Extension 3-/5    Left Knee Flexion 3+/5    Left Knee Extension 3+/5    Right/Left Ankle Right;Left    Right Ankle Dorsiflexion 2/5    Left Ankle Dorsiflexion 4/5      Bed Mobility   Bed Mobility Rolling Right;Rolling Left;Supine to Sit;Sit to Supine    Rolling Right Minimal Assistance - Patient > 75%    Rolling Left Minimal Assistance - Patient > 75%    Supine to Sit Minimal Assistance - Patient > 75%    Sit to Supine Minimal Assistance - Patient > 75%      Transfers   Transfers Sit to Stand;Stand to Sit;Stand Pivot Transfers    Sit to Stand 4: Min guard    Five time sit to stand comments  completed 3 reps in 22.37 secs with UE support, unable to complete full test due to fatigue/SOB. HR: 62, Sp02: 97%    Stand to Sit 4: Min guard    Stand Pivot Transfers 4: Min guard;4: Lexicographer Details (indicate cue type and reason) completed transfer with [facility] from w/c <> mat, CGA - Min A required.      Ambulation/Gait   Ambulation/Gait Yes    Ambulation/Gait Assistance 4: Min guard;4: Min assist    Ambulation/Gait Assistance Details Completed ambulation with LBQC x 20 ft, PT providing cues for foot placement. Unable to ambulate further due to fatigue. Patient demo slight inversion still with AFO donned.    Ambulation Distance (Feet) 20 Feet    Assistive device Large base quad cane    Gait Pattern Step-to pattern;Decreased stance time - right;Decreased step length - right;Decreased step length  - left;Decreased arm swing - right;Decreased hip/knee flexion - right;Decreased dorsiflexion - right;Decreased weight shift to right;Narrow base of support;Poor foot clearance - right    Ambulation Surface Level;Indoor      Balance   Balance Assessed Yes      Static Standing Balance   Static Standing - Balance Support No upper extremity supported    Static Standing - Level of Assistance 4: Min assist;5: Stand by assistance    Static Standing - Comment/# of Minutes able to stand 20-30 seconds without UE support and CGA/Min A for balance. increased postural sway noted.      RLE Tone   RLE Tone Mild;Hypertonic                      Objective measurements completed on examination: See above findings.               PT Education - 08/25/20 1145    Education Details Educated on POC/evaluation findings    Person(s) Educated Patient;Child(ren)    Methods Explanation    Comprehension Verbalized understanding            PT Short Term Goals - 08/25/20 1328      PT SHORT TERM GOAL #1   Title Patient will be independent with initial HEP with family member assistance to improve functional outcomes (All STGs Due: 09/15/20)    Baseline HEP from prior POC    Time 3    Period Weeks    Status New    Target Date 09/15/20  PT SHORT TERM GOAL #2   Title Patient will be able to perform 5x sit <> stand in < 45 seconds to demonstrate improvement in functional mobility and reduced risk for falls.    Baseline unable to complete, 3 reps in 22.37 secs    Time 3    Period Weeks    Status New      PT SHORT TERM GOAL #3   Title Patient will be able to ambulate for >/= 50 ft with LBQC vs RW for improved household mobility    Baseline 20 ft    Time 3    Period Weeks    Status New      PT SHORT TERM GOAL #4   Title Patient will be able to stand without UE support and close supervision for >/= 1 minute for improved tolerance for ADLs    Baseline 20-30 secs, CGA    Time 3     Period Weeks    Status New             PT Long Term Goals - 08/25/20 1334      PT LONG TERM GOAL #1   Title Patient will be independent with final HEP for strength/balance with family member assistance (All LTGs Due: 10/06/20)    Baseline HEP from prior POC    Time 6    Period Weeks    Status New    Target Date 10/06/20      PT LONG TERM GOAL #2   Title Patient will be able to complete 5x sit <> stand in </= 30 seconds with CGA to demo improved balance and functional mobility    Baseline unable to complete    Time 6    Period Weeks    Status New      PT LONG TERM GOAL #3   Title Patient will demo ability to complete transfers from w/c <> mat with LRAD and supervision    Baseline CGA - Min A    Time 6    Period Weeks    Status New      PT LONG TERM GOAL #4   Title Patient will be able to stand >/= 2 minutes without UE support to improve tolerance for ADLs    Baseline < 30 seconds    Time 6    Period Weeks    Status New      PT LONG TERM GOAL #5   Title Patient will be able to ambulate for >/= 100 ft with LBQC vs RW for improved household mobility    Baseline 20 ft    Time 6    Period Weeks    Status New                  Plan - 08/25/20 1228    Clinical Impression Statement Patient is a 87 y.o. male referred to Neuro OPPT services for Gait Abnormality, due to CVA in 2020. 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 Patient presents with the following impairments: abnormal gait, decreased strength, decreased balance, decreased functional mobility, and increased fall risk impacting patient's ability to perform ADL's and functional mobility tasks. Patient is currently using w/c for primary means of mobility, but does complete short distance ambulation with LBQC. Ambulating  approx 20 ft today during evaluation with Min A. Patient unable to  complete full 5x sit <> stand test, but able to complete 3 reps in 22.37 secs indicate high fall risk. Patient will benefit from skilled PT services to address impairments, maximize functional mobility, and reduce fall risk.    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, lumbar fracture December 2020    Examination-Activity Limitations Bed Mobility;Locomotion Level;Sit;Stand;Transfers    Examination-Participation Restrictions Medication Management;Community Activity;Interpersonal Relationship    Stability/Clinical Decision Making Evolving/Moderate complexity    Clinical Decision Making Moderate    Rehab Potential Fair    PT Frequency 2x / week    PT Duration 6 weeks    PT Treatment/Interventions ADLs/Self Care Home Management;Cryotherapy;Moist Heat;DME Instruction;Gait training;Stair training;Functional mobility training;Therapeutic activities;Therapeutic exercise;Balance training;Neuromuscular re-education;Patient/family education;Orthotic Fit/Training;Manual techniques;Wheelchair mobility training;Passive range of motion    PT Next Visit Plan Futher assess AFO. Gait Training with LBQC vs. RW? Review/Update Prior HEP from [facility] or start new seated/supine strengthening HEP    Recommended Other Services Occupational Therapy    Consulted and Agree with Plan of Care Patient;Family member/caregiver    Family Member Consulted Daughter/Son           Patient will benefit from skilled therapeutic intervention in order to improve the following deficits and impairments:  Abnormal gait,Decreased coordination,Decreased range of motion,Difficulty walking,Impaired tone,Pain,Impaired UE functional  use,Decreased safety awareness,Decreased endurance,Decreased activity tolerance,Decreased knowledge of use of DME,Decreased balance,Decreased cognition,Decreased strength,Impaired sensation  Visit Diagnosis: Other abnormalities of gait and mobility  Other lack of coordination  Muscle weakness (generalized)  Unsteadiness on feet  Difficulty in walking, not elsewhere classified     Problem List Patient Active Problem List   Diagnosis Date Noted  . Rib pain on right side 03/19/2020  . Sundowning 07/04/2019  . Bilateral hearing loss 04/16/2019  . Urinary frequency 03/21/2019  . Chronic right shoulder pain 12/19/2018  . Weakness generalized 09/28/2018  . Palliative care encounter 09/28/2018  . Pressure injury of skin 08/27/2018  . COPD (chronic obstructive pulmonary disease) ([place]) 08/26/2018  . AMS (altered mental status) 08/22/2018  . Altered mental status 08/21/2018  . UTI (urinary tract infection) 08/21/2018  . Abnormality of gait 08/20/2018  . Prolonged Q-T interval on ECG   . Dysphagia, post-stroke   . Supplemental oxygen dependent   . Anxiety state   . Fatigue   . SOB (shortness of breath)   . Hyperglycemia   . History of lung cancer   . Depression   . CKD (chronic kidney disease), stage III ([place])   . Acute blood loss anemia   . Fall 07/16/2018  . 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 Thalamic hemorrhage ([place]) 07/16/2018  . Acute spontaneous 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 ([place]) L thalamic 07/14/2018  . Cellulitis of arm, right 06/01/2018  . Rash and nonspecific skin eruption 06/01/2018  . Chest pain 02/02/2018  . Thrombocytopenia ([place]) 02/02/2018  . Hypokalemia 02/02/2018  . Pulmonary embolism ([place]) 02/02/2018  . Elevated troponin 02/02/2018  . Pulmonary embolus ([place]) 02/02/2018  . Non-ST elevation MI (NSTEMI) ([place]) 06/27/2017  . Diastolic CHF ([place]) 54/65/0354  . Chest pain due to CAD ([place]) 06/26/2017  . Dyspnea  06/21/2017  . Acute bronchitis 05/15/2017  . Oral candidiasis 05/15/2017  . Acute pericarditis   . Chest pain at rest 12/20/2016  . Angina pectoris ([place]) 12/20/2016  . Radiation pneumonitis (  [place]) 11/09/2016  . Pleurisy 10/11/2016  . Chest wall pain 10/11/2016  . Atrial fibrillation ([place]) 05/21/2016  . Adenocarcinoma of right lung ([place]) 01/06/2016  . GAD (generalized anxiety disorder) 11/20/2015  . Essential hypertension   . Cellulitis 09/04/2015  . Cellulitis of left axilla   . Esophageal ulcer without bleeding   . Bilateral lower extremity edema 04/28/2014  . BPH (benign prostatic hyperplasia) 10/08/2012  . Anxiety   . EKG abnormalities 09/05/2012  . Tremor 09/05/2012  . Chronic fatigue 09/05/2012  . Arthritis   . Asthma, chronic   . Reflux esophagitis   . S/P CABG (coronary artery bypass graft)   . GERD (gastroesophageal reflux disease)   . Hyperlipidemia   . Hypertension   . UNSPECIFIED PERIPHERAL VASCULAR DISEASE 06/16/2009    Jones Bales, PT, DPT 08/25/2020, 1:39 PM  [facility] [address] [place], [place], [address] Phone: [phone]   Fax:  [phone]  Name: ERVING SASSANO MRN: [id number] Date of Birth: [birth year]

## 2020-08-25 NOTE — Therapy (Signed)
[facility] [address] [place], [place], [address] Phone: [phone]   Fax:  [phone]  Occupational Therapy Evaluation  Patient Details  Name: SAMIN MILKE MRN: [id number] Date of Birth: Jul 01, 1927 Referring Provider (OT): Dr. Jamse Arn   Encounter Date: 08/25/2020   OT End of Session - 08/25/20 1426    Visit Number 1    Number of Visits 17    Date for OT Re-Evaluation 10/24/20    Authorization Type [employer] [place] - Visit Number 1    Authorization - Number of Visits 10    Progress Note Due on Visit 10    OT Start Time 1100    OT Stop Time 1145    OT Time Calculation (min) 45 min    Activity Tolerance Patient tolerated treatment well    Behavior During Therapy [facility] for tasks assessed/performed           Past Medical History:  Diagnosis Date  . Adenocarcinoma of right lung ([place]) 01/06/2016  . Anginal chest pain at rest [facility])    Chronic non, controlled on Lorazepam  . Anxiety   . Arthritis    "all over"   . BPH (benign prostatic hyperplasia)   . CAD (coronary artery disease)   . Chronic lower back pain   . Complication of anesthesia    "problems making water afterwards"  . Depression   . DJD (degenerative joint disease)   . Esophageal ulcer without bleeding    bid ppi indefinitely  . Family history of adverse reaction to anesthesia    "children w/PONV"  . GERD (gastroesophageal reflux disease)   . Headache    "couple/week maybe" (09/04/2015)  . Hemochromatosis    Possible, elevated iron stores, hook-like osteophytes on hand films, normal LFTs  . Hepatitis    "yellow jaundice as a baby"  . History of ASCVD    MULTIVESSEL  . Hyperlipidemia   . Hypertension   . Osteoarthritis   . Pneumonia    "several times; got a little now" (09/04/2015)  . Rheumatoid arthritis ([place])   . Subdural hematoma ([facility])    small after fall 09/2016-plavix held, neurosurgery  consulted.  Asymptomatic.    Marland Kitchen 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[place]) 02/2018   on Lovenox lifelong since he failed PO Eliquis    Past Surgical History:  Procedure Laterality Date  . ARM SKIN LESION BIOPSY / EXCISION Left 09/02/2015  . CATARACT EXTRACTION W/ INTRAOCULAR LENS  IMPLANT, BILATERAL Bilateral   . CHOLECYSTECTOMY OPEN    . CORNEAL TRANSPLANT Bilateral    "one at [facility]; one at [facility]"  . CORONARY ANGIOPLASTY WITH STENT PLACEMENT    . CORONARY ARTERY BYPASS GRAFT  1999  . DESCENDING AORTIC ANEURYSM REPAIR W/ STENT     DE stent ostium into the right radial free graft at OM-, 02-2007  . ESOPHAGOGASTRODUODENOSCOPY N/A 11/09/2014   Procedure: ESOPHAGOGASTRODUODENOSCOPY (EGD);  Surgeon: [name], MD;  Location: [facility] ENDOSCOPY;  Service: Endoscopy;  Laterality: N/A;  . INGUINAL HERNIA REPAIR    . JOINT REPLACEMENT    . LEFT HEART CATH AND CORS/GRAFTS ANGIOGRAPHY N/A 06/27/2017   Procedure: LEFT HEART CATH AND CORS/GRAFTS ANGIOGRAPHY;  Surgeon: [name], MD;  Location: [place] CV LAB;  Service: Cardiovascular;  Laterality: N/A;  . NASAL SINUS SURGERY    . SHOULDER OPEN ROTATOR CUFF REPAIR Right   . TOTAL KNEE ARTHROPLASTY Bilateral     There were no vitals filed for this  visit.   Subjective Assessment - 08/25/20 1112    Subjective  Dtr reports desire for incr RUE functional use    Patient is accompanied by: Family member   son, daughter   Pertinent History hx of Thalamic hemorrhage.        PMH:  HOH, CAD, RA, Hx of R RTC repair, hx of bilateral knee arthroplasty, hx of CABG, hx of aortic andurysm repair, hx of corneal transplant. hx of chronic low back pain, depression, R lung adenocarcinoma, posssible radiation (not active) pneumonitis, A fib, hx of DVT/PE--on treatment dose Lovenox, SDH 09/2016, chronic thrombocytopenia    Patient Stated Goals incr RUE functional use    Currently in Pain? No/denies             [facility] OT Assessment - 08/25/20 1113      Assessment   Medical Diagnosis  Thalmic hemorrhage    Referring Provider (OT) Dr. Jamse Arn    Onset Date/Surgical Date 07/06/20   referral, CVA 08/2017   Hand Dominance Right    Prior Therapy last OT 10/2019      Precautions   Precautions Fall    Precaution Comments hard of hearing      Balance Screen   Has the patient fallen in the past 6 months No   see PT eval     Home  Environment   Family/patient expects to be discharged to: Private residence    Lives With --   3 children rotate care (24hr supervision/assist)     Prior Function   Level of Independence Needs assistance with ADLs;Needs assistance with gait   since CVA   Leisure eat, read the newspaper, watch tv      ADL   Eating/Feeding Modified independent   uses LUE   Grooming --   pt brushes teeth with LUE, caregiver combs hair   Upper Body Bathing Moderate assistance    Lower Body Bathing Maximal assistance    Upper Body Dressing Moderate assistance    Lower Body Dressing Maximal assistance    Toilet Transfer --   uses urinal most of the time, mod A   Toileting - Clothing Manipulation --   dependent   Toileting -  Hygiene --   dependent   Tub/Shower Transfer Moderate assistance    Archivist seat with back;Grab bars    ADL comments family assist more due to time constraints      Mobility   Mobility Status Comments Pt ambulates minimally with CGA to close supervision with quad cane per family--see PT eval for details.  Pt presents in w/c today.      Vision - History   Baseline Vision Wears glasses all the time   bifocals   Additional Comments Pt/family report WFL      Cognition   Overall Cognitive Status Impaired/Different from baseline    Memory Impaired   decr short term memory   Cognition Comments Family answered most questions today due to pt HOH (at pt request), but pt able to answer simple questions and follow simple commands      Posture/Postural Control   Posture/Postural Control Postural limitations     Postural Limitations Rounded Shoulders;Forward head;Posterior pelvic tilt      Coordination   Gross Motor Movements are Fluid and Coordinated No    Fine Motor Movements are Fluid and Coordinated No    Box and Blocks R-4 with difficulty/drops, L-25 blocks (with pause)    Coordination ataxia with RUE, ?apraxia  as well.  Pt demo difficulty with 2-3 point pinch.  Pt able to pick up cylinder object and 1-inch block with mod-max difficulty and drops.      Tone   Assessment Location Right Upper Extremity      ROM / Strength   AROM / PROM / Strength AROM      AROM   Overall AROM  Deficits    Overall AROM Comments R scaption (not true flexion) 60*, decr IR/ER and abduction and wrist ext, difficulty perfoming opposition to each digit with ataxia and decr coordination affecting ability      Strength   Overall Strength Comments LUE proximal strength grossly 4/5, R shoulder strength grossly 3- to 3/5 and bicep/triceps 3+/5 (but difficult to assess with ataxia and tone, and ?apraxia)      Hand Function   Right Hand Grip (lbs) 7.4    Left Hand Grip (lbs) 19.4    Comment Pt/family report that pt only uses RUE to help push up/reach back for w/c and lock R w/c brake with cueing.      RUE Tone   RUE Tone --   dystonia positions noted with wrist/hand as well as flexor synergy patterns                          OT Education - 08/25/20 1519    Education Details DIscussed OT eval/POC; Recommended family encourage pt to try to use RUE to wash LUE and tops of legs    Person(s) Educated Patient;Child(ren)    Methods Explanation    Comprehension Verbalized understanding            OT Short Term Goals - 08/25/20 1509      OT SHORT TERM GOAL #1   Title Pt/family will be independent with HEP for incr RUE functional use.--check STGs 09/24/20    Time 4    Period Weeks    Status New      OT SHORT TERM GOAL #2   Title Pt will improve LUE coordination and low level functional reaching  as shown by ability to grasp/release cylinder object in at least 3/5 trials.    Time 4    Period Weeks    Status New      OT SHORT TERM GOAL #3   Title Pt will be able to pick up 1" object using pinch at least 2/5 trials for incr coordination for ADLs.    Time 4    Period Weeks    Status New      OT SHORT TERM GOAL #4   Title --      OT SHORT TERM GOAL #5   Title --             OT Long Term Goals - 08/25/20 1513      OT LONG TERM GOAL #1   Title Pt will incorporate RUE functionally in at least 3 additional ADL tasks consistently.    Baseline currently only using to push up/reach back for w/c and lock w/c brake with cues    Time 8    Period Weeks    Status New      OT LONG TERM GOAL #2   Title Pt will improve RUE coordination as shown by improving score on box and blocks test by at least 4.    Baseline 4 blocks.    Time 8    Period Weeks    Status New  OT LONG TERM GOAL #3   Title Pt will improve LUE coordination and functional reaching to midlevel as shown by ability to grasp/release cylinder object in at least 3/5 trials.    Time 8    Period Weeks    Status New      OT LONG TERM GOAL #4   Title Pt will be able to pick up 1" object using pinch and place in bowl at least 4/5 trials for incr coordination for ADLs.    Time 8    Period Weeks    Status New                 Plan - 08/25/20 1432    Clinical Impression Statement Pt is a 87 y.o. male referred for occupational therapy for hx of CVA (08/2017 per family report).  Pt/family report desire to incr RUE functional use and coordination with last outpatient OT 10/2019.  Pt currently using RUE minimally <10% functionally.  Pt with PMH that includes:HOH, CAD, RA, Hx of R RTC repair, hx of bilateral knee arthroplasty, hx of CABG, hx of aortic andurysm repair, hx of corneal transplant. hx of chronic low back pain, depression, R lung adenocarcinoma, posssible radiation (not active) pneumonitis, A fib, hx of  DVT/PE--on treatment dose Lovenox, SDH 09/2016, chronic thrombocytopenia.  Pt presents today with decr dominant RUE functional use, decr coordination, decr strength, ataxia, decr functional mobility.  Pt would benefit from occupational therapy to incr RUE functional use and update HEP.    OT Occupational Profile and History Detailed Assessment- Review of Records and additional review of physical, cognitive, psychosocial history related to current functional performance    Occupational performance deficits (Please refer to evaluation for details): ADL's;IADL's    Body Structure / Function / Physical Skills ADL;Decreased knowledge of use of DME;Strength;UE functional use;ROM;FMC;GMC;Dexterity;Mobility;Coordination    Rehab Potential Fair    Clinical Decision Making Several treatment options, min-mod task modification necessary    Comorbidities Affecting Occupational Performance: May have comorbidities impacting occupational performance    Modification or Assistance to Complete Evaluation  Min-Moderate modification of tasks or assist with assess necessary to complete eval    OT Frequency 2x / week    OT Duration 8 weeks   +eval, may discharge after 6 weeks depending on progress   OT Treatment/Interventions Moist Heat;Fluidtherapy;Self-care/ADL training;DME and/or AE instruction;Splinting;Therapeutic activities;Therapeutic exercise;Neuromuscular education;Functional Mobility Training;Passive range of motion;Patient/family education;Manual Therapy    Plan HEP for RUE functional use/coordination    OT Home Exercise Plan Pt has been doing shoulder flex with cane and theraband exercises (shoulder ext, scapular retraction, horizontal abduction) per dtr    Consulted and Agree with Plan of Care Patient;Family member/caregiver    Family Member Consulted daughter, son           Patient will benefit from skilled therapeutic intervention in order to improve the following deficits and impairments:   Body  Structure / Function / Physical Skills: ADL,Decreased knowledge of use of DME,Strength,UE functional use,ROM,FMC,GMC,Dexterity,Mobility,Coordination       Visit Diagnosis: Other lack of coordination  Ataxia  Muscle weakness (generalized)  Other symptoms and signs involving the nervous system  Unsteadiness on feet  Other symptoms and signs involving the musculoskeletal system    Problem List Patient Active Problem List   Diagnosis Date Noted  . Rib pain on right side 03/19/2020  . Sundowning 07/04/2019  . Bilateral hearing loss 04/16/2019  . Urinary frequency 03/21/2019  . Chronic right shoulder pain 12/19/2018  . Weakness  generalized 09/28/2018  . Palliative care encounter 09/28/2018  . Pressure injury of skin 08/27/2018  . COPD (chronic obstructive pulmonary disease) ([place]) 08/26/2018  . AMS (altered mental status) 08/22/2018  . Altered mental status 08/21/2018  . UTI (urinary tract infection) 08/21/2018  . Abnormality of gait 08/20/2018  . Prolonged Q-T interval on ECG   . Dysphagia, post-stroke   . Supplemental oxygen dependent   . Anxiety state   . Fatigue   . SOB (shortness of breath)   . Hyperglycemia   . History of lung cancer   . Depression   . CKD (chronic kidney disease), stage III ([place])   . Acute blood loss anemia   . Fall 07/16/2018  . 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 Acute spontaneous 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 ([place]) L thalamic 07/14/2018  . Cellulitis of arm, right 06/01/2018  . Rash and nonspecific skin eruption 06/01/2018  . Chest pain 02/02/2018  . Thrombocytopenia ([place]) 02/02/2018  . Hypokalemia 02/02/2018  . Pulmonary embolism ([place]) 02/02/2018  . Elevated troponin 02/02/2018  . Pulmonary embolus ([place]) 02/02/2018  . Non-ST elevation MI (NSTEMI) ([place]) 06/27/2017  . Diastolic CHF ([place]) 45/36/4680  . Chest pain due to CAD ([place]) 06/26/2017  . Dyspnea  06/21/2017  . Acute bronchitis 05/15/2017  . Oral candidiasis 05/15/2017  . Acute pericarditis   . Chest pain at rest 12/20/2016  . Angina pectoris ([place]) 12/20/2016  . Radiation pneumonitis ([place]) 11/09/2016  . Pleurisy 10/11/2016  . Chest wall pain 10/11/2016  . Atrial fibrillation ([place]) 05/21/2016  . Adenocarcinoma of right lung ([place]) 01/06/2016  . GAD (generalized anxiety disorder) 11/20/2015  . Essential hypertension   . Cellulitis 09/04/2015  . Cellulitis of left axilla   . Esophageal ulcer without bleeding   . Bilateral lower extremity edema 04/28/2014  . BPH (benign prostatic hyperplasia) 10/08/2012  . Anxiety   . EKG abnormalities 09/05/2012  . Tremor 09/05/2012  . Chronic fatigue 09/05/2012  . Arthritis   . Asthma, chronic   . Reflux esophagitis   . S/P CABG (coronary artery bypass graft)   . GERD (gastroesophageal reflux disease)   . Hyperlipidemia   . Hypertension   . UNSPECIFIED PERIPHERAL VASCULAR DISEASE 06/16/2009    [facility] 08/25/2020, 3:20 PM  [facility] [address] [place], [place], [address] Phone: [phone]   Fax:  [phone]  Name: KELYN KOSKELA MRN: [id number] Date of Birth: 1928/02/19   Vianne Bulls, OTR/L [facility] [address] [place], [place]  [address] [phone] phone [phone] 08/25/20 3:20 PM

## 2020-09-01 ENCOUNTER — Ambulatory Visit: Payer: Medicare HMO | Admitting: Occupational Therapy

## 2020-09-01 ENCOUNTER — Other Ambulatory Visit: Payer: Self-pay

## 2020-09-01 ENCOUNTER — Ambulatory Visit: Payer: Medicare HMO

## 2020-09-01 DIAGNOSIS — R278 Other lack of coordination: Secondary | ICD-10-CM

## 2020-09-01 DIAGNOSIS — R270 Ataxia, unspecified: Secondary | ICD-10-CM | POA: Diagnosis not present

## 2020-09-01 DIAGNOSIS — R29898 Other symptoms and signs involving the musculoskeletal system: Secondary | ICD-10-CM | POA: Diagnosis not present

## 2020-09-01 DIAGNOSIS — R3915 Urgency of urination: Secondary | ICD-10-CM | POA: Diagnosis not present

## 2020-09-01 DIAGNOSIS — R2689 Other abnormalities of gait and mobility: Secondary | ICD-10-CM | POA: Diagnosis not present

## 2020-09-01 DIAGNOSIS — M6281 Muscle weakness (generalized): Secondary | ICD-10-CM | POA: Diagnosis not present

## 2020-09-01 DIAGNOSIS — R3914 Feeling of incomplete bladder emptying: Secondary | ICD-10-CM | POA: Diagnosis not present

## 2020-09-01 DIAGNOSIS — N3000 Acute cystitis without hematuria: Secondary | ICD-10-CM | POA: Diagnosis not present

## 2020-09-01 DIAGNOSIS — R29818 Other symptoms and signs involving the nervous system: Secondary | ICD-10-CM

## 2020-09-01 DIAGNOSIS — R2681 Unsteadiness on feet: Secondary | ICD-10-CM | POA: Diagnosis not present

## 2020-09-01 DIAGNOSIS — R262 Difficulty in walking, not elsewhere classified: Secondary | ICD-10-CM | POA: Diagnosis not present

## 2020-09-01 NOTE — Patient Instructions (Signed)
Wipe tabletop with your right hand forwards and backwards, and side to side 20 times while seated.  Practice folding and unfolding a washcloth with your right hand you can assist with the left  Use your right hand to hold a plastic bottle, practice opening and closing lid with your left hand  Pick up small unbreakable items such as medicine bottles with your right hand  Use your right hand to bathe your chest  Eat finger foods with your right hand such as toast  Make sure you know where the right hand is at all times

## 2020-09-01 NOTE — Therapy (Signed)
[facility] [address] [place], [place], [address] Phone: [phone]   Fax:  [phone]  Occupational Therapy Treatment  Patient Details  Name: Evan [name] MRN: [id number] Date of Birth: Feb 14, 1928 Referring Provider (OT): Dr. Jamse Arn   Encounter Date: 09/01/2020   OT End of Session - 09/01/20 1217    Visit Number 2    Number of Visits 17    Date for OT Re-Evaluation 10/24/20    Authorization Type [employer] [place] - Visit Number 2    Authorization - Number of Visits 10    Progress Note Due on Visit 10    OT Start Time 0935    OT Stop Time 1015    OT Time Calculation (min) 40 min    Activity Tolerance Patient tolerated treatment well    Behavior During Therapy [facility] for tasks assessed/performed           Past Medical History:  Diagnosis Date  . Adenocarcinoma of right lung ([place]) 01/06/2016  . Anginal chest pain at rest [facility])    Chronic non, controlled on Lorazepam  . Anxiety   . Arthritis    "all over"   . BPH (benign prostatic hyperplasia)   . CAD (coronary artery disease)   . Chronic lower back pain   . Complication of anesthesia    "problems making water afterwards"  . Depression   . DJD (degenerative joint disease)   . Esophageal ulcer without bleeding    bid ppi indefinitely  . Family history of adverse reaction to anesthesia    "children w/PONV"  . GERD (gastroesophageal reflux disease)   . Headache    "couple/week maybe" (09/04/2015)  . Hemochromatosis    Possible, elevated iron stores, hook-like osteophytes on hand films, normal LFTs  . Hepatitis    "yellow jaundice as a baby"  . History of ASCVD    MULTIVESSEL  . Hyperlipidemia   . Hypertension   . Osteoarthritis   . Pneumonia    "several times; got a little now" (09/04/2015)  . Rheumatoid arthritis ([place])   . Subdural hematoma ([facility])    small after fall 09/2016-plavix held, neurosurgery consulted.   Asymptomatic.    Marland Kitchen 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[place]) 02/2018   on Lovenox lifelong since he failed PO Eliquis    Past Surgical History:  Procedure Laterality Date  . ARM SKIN LESION BIOPSY / EXCISION Left 09/02/2015  . CATARACT EXTRACTION W/ INTRAOCULAR LENS  IMPLANT, BILATERAL Bilateral   . CHOLECYSTECTOMY OPEN    . CORNEAL TRANSPLANT Bilateral    "one at [facility]; one at [facility]"  . CORONARY ANGIOPLASTY WITH STENT PLACEMENT    . CORONARY ARTERY BYPASS GRAFT  1999  . DESCENDING AORTIC ANEURYSM REPAIR W/ STENT     DE stent ostium into the right radial free graft at OM-, 02-2007  . ESOPHAGOGASTRODUODENOSCOPY N/A 11/09/2014   Procedure: ESOPHAGOGASTRODUODENOSCOPY (EGD);  Surgeon: [name], MD;  Location: [facility] ENDOSCOPY;  Service: Endoscopy;  Laterality: N/A;  . INGUINAL HERNIA REPAIR    . JOINT REPLACEMENT    . LEFT HEART CATH AND CORS/GRAFTS ANGIOGRAPHY N/A 06/27/2017   Procedure: LEFT HEART CATH AND CORS/GRAFTS ANGIOGRAPHY;  Surgeon: [name], MD;  Location: [place] CV LAB;  Service: Cardiovascular;  Laterality: N/A;  . NASAL SINUS SURGERY    . SHOULDER OPEN ROTATOR CUFF REPAIR Right   . TOTAL KNEE ARTHROPLASTY Bilateral     There were no vitals filed for this  visit.   Subjective Assessment - 09/01/20 0940    Subjective  Denies pain    Pertinent History hx of Thalamic hemorrhage.        PMH:  HOH, CAD, RA, Hx of R RTC repair, hx of bilateral knee arthroplasty, hx of CABG, hx of aortic andurysm repair, hx of corneal transplant. hx of chronic low back pain, depression, R lung adenocarcinoma, posssible radiation (not active) pneumonitis, A fib, hx of DVT/PE--on treatment dose Lovenox, SDH 09/2016, chronic thrombocytopenia    Patient Stated Goals incr RUE functional use    Currently in Pain? No/denies                                OT Treatment/ Education - 09/01/20 1034    Education Details HEP for RUE functional use, see pt instructions, min-mod v.c initally, pt's dtr  was present and can assist, recommended pt hold off on theraband use due to poor RUE control    Person(s) Educated Patient;Child(ren)    Methods Explanation;Demonstration;Verbal cues;Handout    Comprehension Verbalized understanding;Returned demonstration;Verbal cues required;Tactile cues required            OT Short Term Goals - 08/25/20 1509      OT SHORT TERM GOAL #1   Title Pt/family will be independent with HEP for incr RUE functional use.--check STGs 09/24/20    Time 4    Period Weeks    Status New      OT SHORT TERM GOAL #2   Title Pt will improve LUE coordination and low level functional reaching as shown by ability to grasp/release cylinder object in at least 3/5 trials.    Time 4    Period Weeks    Status New      OT SHORT TERM GOAL #3   Title Pt will be able to pick up 1" object using pinch at least 2/5 trials for incr coordination for ADLs.    Time 4    Period Weeks    Status New      OT SHORT TERM GOAL #4   Title --      OT SHORT TERM GOAL #5   Title --             OT Long Term Goals - 08/25/20 1513      OT LONG TERM GOAL #1   Title Pt will incorporate RUE functionally in at least 3 additional ADL tasks consistently.    Baseline currently only using to push up/reach back for w/c and lock w/c brake with cues    Time 8    Period Weeks    Status New      OT LONG TERM GOAL #2   Title Pt will improve RUE coordination as shown by improving score on box and blocks test by at least 4.    Baseline 4 blocks.    Time 8    Period Weeks    Status New      OT LONG TERM GOAL #3   Title Pt will improve LUE coordination and functional reaching to midlevel as shown by ability to grasp/release cylinder object in at least 3/5 trials.    Time 8    Period Weeks    Status New      OT LONG TERM GOAL #4   Title Pt will be able to pick up 1" object using pinch and place in bowl at least 4/5 trials for  incr coordination for ADLs.    Time 8    Period Weeks    Status  New                 Plan - 09/01/20 1037    Clinical Impression Statement Pt is progressing towards goals. Pt/dtr  demonstrates understanding of HEP    OT Occupational Profile and History Detailed Assessment- Review of Records and additional review of physical, cognitive, psychosocial history related to current functional performance    Occupational performance deficits (Please refer to evaluation for details): ADL's;IADL's    Body Structure / Function / Physical Skills ADL;Decreased knowledge of use of DME;Strength;UE functional use;ROM;FMC;GMC;Dexterity;Mobility;Coordination    Rehab Potential Fair    Clinical Decision Making Several treatment options, min-mod task modification necessary    Comorbidities Affecting Occupational Performance: May have comorbidities impacting occupational performance    Modification or Assistance to Complete Evaluation  Min-Moderate modification of tasks or assist with assess necessary to complete eval    OT Frequency 2x / week    OT Duration 8 weeks   +eval, may discharge after 6 weeks depending on progress   OT Treatment/Interventions Moist Heat;Fluidtherapy;Self-care/ADL training;DME and/or AE instruction;Splinting;Therapeutic activities;Therapeutic exercise;Neuromuscular education;Functional Mobility Training;Passive range of motion;Patient/family education;Manual Therapy    Plan RUE functional use, assess cane exercises/ theraband exercises pt has been performing.    OT Home Exercise Plan Pt has been doing shoulder flex with cane and theraband exercises (shoulder ext, scapular retraction, horizontal abduction) per dtr    Consulted and Agree with Plan of Care Patient;Family member/caregiver    Family Member Consulted daughter, son           Patient will benefit from skilled therapeutic intervention in order to improve the following deficits and impairments:   Body Structure / Function / Physical Skills: ADL,Decreased knowledge of use of  DME,Strength,UE functional use,ROM,FMC,GMC,Dexterity,Mobility,Coordination       Visit Diagnosis: Other lack of coordination  Ataxia  Muscle weakness (generalized)  Other symptoms and signs involving the nervous system    Problem List Patient Active Problem List   Diagnosis Date Noted  . Rib pain on right side 03/19/2020  . Sundowning 07/04/2019  . Bilateral hearing loss 04/16/2019  . Urinary frequency 03/21/2019  . Chronic right shoulder pain 12/19/2018  . Weakness generalized 09/28/2018  . Palliative care encounter 09/28/2018  . Pressure injury of skin 08/27/2018  . COPD (chronic obstructive pulmonary disease) ([place]) 08/26/2018  . AMS (altered mental status) 08/22/2018  . Altered mental status 08/21/2018  . UTI (urinary tract infection) 08/21/2018  . Abnormality of gait 08/20/2018  . Prolonged Q-T interval on ECG   . Dysphagia, post-stroke   . Supplemental oxygen dependent   . Anxiety state   . Fatigue   . SOB (shortness of breath)   . Hyperglycemia   . History of lung cancer   . Depression   . CKD (chronic kidney disease), stage III ([place])   . Acute blood loss anemia   . Fall 07/16/2018  . 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 Acute spontaneous 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 ([place]) L thalamic 07/14/2018  . Cellulitis of arm, right 06/01/2018  . Rash and nonspecific skin eruption 06/01/2018  . Chest pain 02/02/2018  . Thrombocytopenia ([place]) 02/02/2018  . Hypokalemia 02/02/2018  . Pulmonary embolism ([place]) 02/02/2018  . Elevated troponin 02/02/2018  . Pulmonary embolus ([place]) 02/02/2018  . Non-ST elevation MI (NSTEMI) ([place]) 06/27/2017  . Diastolic CHF ([place])  06/26/2017  . Chest pain due to CAD ([place]) 06/26/2017  . Dyspnea 06/21/2017  . Acute bronchitis 05/15/2017  . Oral candidiasis 05/15/2017  . Acute pericarditis   . Chest pain at rest 12/20/2016  . Angina pectoris ([place])  12/20/2016  . Radiation pneumonitis ([place]) 11/09/2016  . Pleurisy 10/11/2016  . Chest wall pain 10/11/2016  . Atrial fibrillation ([place]) 05/21/2016  . Adenocarcinoma of right lung ([place]) 01/06/2016  . GAD (generalized anxiety disorder) 11/20/2015  . Essential hypertension   . Cellulitis 09/04/2015  . Cellulitis of left axilla   . Esophageal ulcer without bleeding   . Bilateral lower extremity edema 04/28/2014  . BPH (benign prostatic hyperplasia) 10/08/2012  . Anxiety   . EKG abnormalities 09/05/2012  . Tremor 09/05/2012  . Chronic fatigue 09/05/2012  . Arthritis   . Asthma, chronic   . Reflux esophagitis   . S/P CABG (coronary artery bypass graft)   . GERD (gastroesophageal reflux disease)   . Hyperlipidemia   . Hypertension   . UNSPECIFIED PERIPHERAL VASCULAR DISEASE 06/16/2009    [place] Doig 09/01/2020, 12:17 PM  [facility] [address] [place], [place], [address] Phone: [phone]   Fax:  [phone]  Name: Evan [name] MRN: [id number] Date of Birth: 1927-09-25

## 2020-09-03 ENCOUNTER — Ambulatory Visit: Payer: Medicare HMO | Attending: Physical Medicine & Rehabilitation

## 2020-09-03 ENCOUNTER — Encounter: Payer: Self-pay | Admitting: Occupational Therapy

## 2020-09-03 ENCOUNTER — Encounter: Payer: Medicare HMO | Attending: Physical Medicine & Rehabilitation | Admitting: Physical Medicine & Rehabilitation

## 2020-09-03 ENCOUNTER — Ambulatory Visit: Payer: Medicare HMO | Admitting: Occupational Therapy

## 2020-09-03 ENCOUNTER — Other Ambulatory Visit: Payer: Self-pay

## 2020-09-03 VITALS — BP 112/60

## 2020-09-03 DIAGNOSIS — G8929 Other chronic pain: Secondary | ICD-10-CM | POA: Insufficient documentation

## 2020-09-03 DIAGNOSIS — R270 Ataxia, unspecified: Secondary | ICD-10-CM | POA: Diagnosis not present

## 2020-09-03 DIAGNOSIS — R29898 Other symptoms and signs involving the musculoskeletal system: Secondary | ICD-10-CM | POA: Insufficient documentation

## 2020-09-03 DIAGNOSIS — R350 Frequency of micturition: Secondary | ICD-10-CM | POA: Insufficient documentation

## 2020-09-03 DIAGNOSIS — R29818 Other symptoms and signs involving the nervous system: Secondary | ICD-10-CM

## 2020-09-03 DIAGNOSIS — I610 Nontraumatic intracerebral hemorrhage in hemisphere, subcortical: Secondary | ICD-10-CM | POA: Insufficient documentation

## 2020-09-03 DIAGNOSIS — M6281 Muscle weakness (generalized): Secondary | ICD-10-CM

## 2020-09-03 DIAGNOSIS — R2689 Other abnormalities of gait and mobility: Secondary | ICD-10-CM

## 2020-09-03 DIAGNOSIS — R262 Difficulty in walking, not elsewhere classified: Secondary | ICD-10-CM | POA: Diagnosis not present

## 2020-09-03 DIAGNOSIS — R2681 Unsteadiness on feet: Secondary | ICD-10-CM | POA: Diagnosis not present

## 2020-09-03 DIAGNOSIS — F05 Delirium due to known physiological condition: Secondary | ICD-10-CM | POA: Insufficient documentation

## 2020-09-03 DIAGNOSIS — R278 Other lack of coordination: Secondary | ICD-10-CM

## 2020-09-03 DIAGNOSIS — R269 Unspecified abnormalities of gait and mobility: Secondary | ICD-10-CM | POA: Insufficient documentation

## 2020-09-03 DIAGNOSIS — M25511 Pain in right shoulder: Secondary | ICD-10-CM | POA: Insufficient documentation

## 2020-09-03 NOTE — Therapy (Signed)
[facility] [address] [place], [place], [address] Phone: [phone]   Fax:  [phone]  Physical Therapy Treatment  Patient Details  Name: Evan [name] MRN: [id number] Date of Birth: 31-Dec-1927 Referring Provider (PT): Delice Lesch, MD   Encounter Date: 09/03/2020   PT End of Session - 09/03/20 0802    Visit Number 2    Number of Visits 13    Date for PT Re-Evaluation 10/24/20   POC for 6 weeks, Cert for 60 days   Authorization Type [employer] (10th Visit PN)    Authorization Time Period Awaiting Authorization Approval    PT Start Time 0800    PT Stop Time 0845    PT Time Calculation (min) 45 min    Equipment Utilized During Treatment Gait belt    Activity Tolerance Patient tolerated treatment well;Patient limited by fatigue    Behavior During Therapy [facility] for tasks assessed/performed           Past Medical History:  Diagnosis Date  . Adenocarcinoma of right lung ([place]) 01/06/2016  . Anginal chest pain at rest [facility])    Chronic non, controlled on Lorazepam  . Anxiety   . Arthritis    "all over"   . BPH (benign prostatic hyperplasia)   . CAD (coronary artery disease)   . Chronic lower back pain   . Complication of anesthesia    "problems making water afterwards"  . Depression   . DJD (degenerative joint disease)   . Esophageal ulcer without bleeding    bid ppi indefinitely  . Family history of adverse reaction to anesthesia    "children w/PONV"  . GERD (gastroesophageal reflux disease)   . Headache    "couple/week maybe" (09/04/2015)  . Hemochromatosis    Possible, elevated iron stores, hook-like osteophytes on hand films, normal LFTs  . Hepatitis    "yellow jaundice as a baby"  . History of ASCVD    MULTIVESSEL  . Hyperlipidemia   . Hypertension   . Osteoarthritis   . Pneumonia    "several times; got a little now" (09/04/2015)  . Rheumatoid arthritis ([place])   . Subdural hematoma ([facility])     small after fall 09/2016-plavix held, neurosurgery consulted.  Asymptomatic.    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 ARM SKIN LESION BIOPSY / EXCISION Left 09/02/2015  . CATARACT EXTRACTION W/ INTRAOCULAR LENS  IMPLANT, BILATERAL Bilateral   . CHOLECYSTECTOMY OPEN    . CORNEAL TRANSPLANT Bilateral    "one at [facility]; one at [facility]"  . CORONARY ANGIOPLASTY WITH STENT PLACEMENT    . CORONARY ARTERY BYPASS GRAFT  1999  . DESCENDING AORTIC ANEURYSM REPAIR W/ STENT     DE stent ostium into the right radial free graft at OM-, 02-2007  . ESOPHAGOGASTRODUODENOSCOPY N/A 11/09/2014   Procedure: ESOPHAGOGASTRODUODENOSCOPY (EGD);  Surgeon: [name], MD;  Location: [facility] ENDOSCOPY;  Service: Endoscopy;  Laterality: N/A;  . INGUINAL HERNIA REPAIR    . JOINT REPLACEMENT    . LEFT HEART CATH AND CORS/GRAFTS ANGIOGRAPHY N/A 06/27/2017   Procedure: LEFT HEART CATH AND CORS/GRAFTS ANGIOGRAPHY;  Surgeon: [name], MD;  Location: [place] CV LAB;  Service: Cardiovascular;  Laterality: N/A;  . NASAL SINUS SURGERY    . SHOULDER OPEN ROTATOR CUFF REPAIR Right   . TOTAL KNEE ARTHROPLASTY Bilateral     Vitals:  09/03/20 0803  BP: 112/60     Subjective Assessment - 09/03/20 0802    Subjective Patient is accompaied by daughter. Denies any new issues. Reports does not do much standing at home.    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 No/denies                             [facility] Adult PT Treatment/Exercise - 09/03/20 0806      Bed Mobility   Bed  Mobility Rolling Left;Sit to Supine;Left Sidelying to Sit    Rolling Left Contact Guard/Touching assist   verbal cues to reach across with right arm   Supine to Sit Contact Guard/Touching assist    Sit to Supine Minimal Assistance - Patient > 75%   at RLE     Transfers   Transfers Sit to Stand;Stand to Sit;Stand Pivot Transfers    Sit to Stand 4: Min guard    Sit to Stand Details Verbal cues for technique;Tactile cues for weight shifting    Sit to Stand Details (indicate cue type and reason) Pt was cued to lean forward more    Stand to Sit 4: Min guard    Stand Pivot Transfers 4: Min guard    Stand Pivot Transfer Details (indicate cue type and reason) w/c to/from mat with [facility]    Comments Pt has right double metal upright AFO with t strap. Still has some inversion noted in standing.      Ambulation/Gait   Ambulation/Gait Yes      Neuro Re-ed    Neuro Re-ed Details  Sit to stand x 5 with verbal and tactile cues to lean forward more and shift weight to the right. Sit to stand x 5 with 2" step under left foot to increase right weight shift CGA. Standing in front of mirror trying to equalize WB 20 sec x 2. Verbal cues to stand up tall. Pt did have some shaking noted as went on. Pt fatigued quickly and needed to sit between reps.      Exercises   Exercises Other Exercises    Other Exercises  Seated edge of mat: LAQ x 10 RLE with 3# ankle weight with verbal cues to try to control movement especially with descent. Seated right hip flexion x 5 with 3# weight and x 5 without weight to see if any differerence. Pt was cued to sit up tall. Seated bridge 5 x 2 with PT stabilizing right hip in neutral. Pt reported just slight pain in low back. Daughter observing throughout and reports pt has red theraband at home that they have been using some.                  PT Education - 09/03/20 1305    Education Details Initiated written HEP similar to some exercises he was doing but reported they did  not have pictures. Added bridges and sit to stand as well. Daughter to assist as needed.    Person(s) Educated Patient;Child(ren)    Methods Explanation;Demonstration;Handout    Comprehension Verbalized understanding;Returned demonstration            PT Short Term Goals -  08/25/20 1328      PT SHORT TERM GOAL #1   Title Patient will be independent with initial HEP with family member assistance to improve functional outcomes (All STGs Due: 09/15/20)    Baseline HEP from prior POC    Time 3    Period Weeks    Status New    Target Date 09/15/20      PT SHORT TERM GOAL #2   Title Patient will be able to perform 5x sit <> stand in < 45 seconds to demonstrate improvement in functional mobility and reduced risk for falls.    Baseline unable to complete, 3 reps in 22.37 secs    Time 3    Period Weeks    Status New      PT SHORT TERM GOAL #3   Title Patient will be able to ambulate for >/= 50 ft with LBQC vs RW for improved household mobility    Baseline 20 ft    Time 3    Period Weeks    Status New      PT SHORT TERM GOAL #4   Title Patient will be able to stand without UE support and close supervision for >/= 1 minute for improved tolerance for ADLs    Baseline 20-30 secs, CGA    Time 3    Period Weeks    Status New             PT Long Term Goals - 08/25/20 1334      PT LONG TERM GOAL #1   Title Patient will be independent with final HEP for strength/balance with family member assistance (All LTGs Due: 10/06/20)    Baseline HEP from prior POC    Time 6    Period Weeks    Status New    Target Date 10/06/20      PT LONG TERM GOAL #2   Title Patient will be able to complete 5x sit <> stand in </= 30 seconds with CGA to demo improved balance and functional mobility    Baseline unable to complete    Time 6    Period Weeks    Status New      PT LONG TERM GOAL #3   Title Patient will demo ability to complete transfers from w/c <> mat with LRAD and supervision     Baseline CGA - Min A    Time 6    Period Weeks    Status New      PT LONG TERM GOAL #4   Title Patient will be able to stand >/= 2 minutes without UE support to improve tolerance for ADLs    Baseline < 30 seconds    Time 6    Period Weeks    Status New      PT LONG TERM GOAL #5   Title Patient will be able to ambulate for >/= 100 ft with LBQC vs RW for improved household mobility    Baseline 20 ft    Time 6    Period Weeks    Status New                 Plan - 09/03/20 1306    Clinical Impression Statement PT focused on initiating seated/supine HEP today. Pt has diffiiculty with controlling the movements in RLE especially eccentrically. Pt fatigues quickly with activities and needed frequent breaks.    Personal Factors and Comorbidities Age;Time since onset of injury/illness/exacerbation;Comorbidity 3+    Comorbidities HTN, HLD,  GERD, DJD, OA, RA, Hearing Loss, CAD, adenocarcinoma of right lung, Anxiety, A fib,, SDH 09/2016, chronic thrombocytopenia, left thalamic hemorrhage 07/2018, lumbar fracture December 2020    Examination-Activity Limitations Bed Mobility;Locomotion Level;Sit;Stand;Transfers    Examination-Participation Restrictions Medication Management;Community Activity;Interpersonal Relationship    Stability/Clinical Decision Making Evolving/Moderate complexity    Rehab Potential Fair    PT Frequency 2x / week    PT Duration 6 weeks    PT Treatment/Interventions ADLs/Self Care Home Management;Cryotherapy;Moist Heat;DME Instruction;Gait training;Stair training;Functional mobility training;Therapeutic activities;Therapeutic exercise;Balance training;Neuromuscular re-education;Patient/family education;Orthotic Fit/Training;Manual techniques;Wheelchair mobility training;Passive range of motion    PT Next Visit Plan Gait Training with LBQC vs. RW? How is HEP going? Work on improving standing tolerance.    Consulted and Agree with Plan of Care Patient;Family member/caregiver     Family Member Consulted Daughter/Son           Patient will benefit from skilled therapeutic intervention in order to improve the following deficits and impairments:  Abnormal gait,Decreased coordination,Decreased range of motion,Difficulty walking,Impaired tone,Pain,Impaired UE functional use,Decreased safety awareness,Decreased endurance,Decreased activity tolerance,Decreased knowledge of use of DME,Decreased balance,Decreased cognition,Decreased strength,Impaired sensation  Visit Diagnosis: Other abnormalities of gait and mobility  Muscle weakness (generalized)     Problem List Patient Active Problem List   Diagnosis Date Noted  . Rib pain on right side 03/19/2020  . Sundowning 07/04/2019  . Bilateral hearing loss 04/16/2019  . Urinary frequency 03/21/2019  . Chronic right shoulder pain 12/19/2018  . Weakness generalized 09/28/2018  . Palliative care encounter 09/28/2018  . Pressure injury of skin 08/27/2018  . COPD (chronic obstructive pulmonary disease) ([place]) 08/26/2018  . AMS (altered mental status) 08/22/2018  . Altered mental status 08/21/2018  . UTI (urinary tract infection) 08/21/2018  . Abnormality of gait 08/20/2018  . Prolonged Q-T interval on ECG   . Dysphagia, post-stroke   . Supplemental oxygen dependent   . Anxiety state   . Fatigue   . SOB (shortness of breath)   . Hyperglycemia   . History of lung cancer   . Depression   . CKD (chronic kidney disease), stage III ([place])   . Acute blood loss anemia   . Fall 07/16/2018  . 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 Acute spontaneous 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 ([place]) L thalamic 07/14/2018  . Cellulitis of arm, right 06/01/2018  . Rash and nonspecific skin eruption 06/01/2018  . Chest pain 02/02/2018  . Thrombocytopenia ([place]) 02/02/2018  . Hypokalemia 02/02/2018  . Pulmonary embolism ([place]) 02/02/2018  . Elevated troponin  02/02/2018  . Pulmonary embolus ([place]) 02/02/2018  . Non-ST elevation MI (NSTEMI) ([place]) 06/27/2017  . Diastolic CHF ([place]) 65/68/1275  . Chest pain due to CAD ([place]) 06/26/2017  . Dyspnea 06/21/2017  . Acute bronchitis 05/15/2017  . Oral candidiasis 05/15/2017  . Acute pericarditis   . Chest pain at rest 12/20/2016  . Angina pectoris ([place]) 12/20/2016  . Radiation pneumonitis ([place]) 11/09/2016  . Pleurisy 10/11/2016  . Chest wall pain 10/11/2016  . Atrial fibrillation ([place]) 05/21/2016  . Adenocarcinoma of right lung ([place]) 01/06/2016  . GAD (generalized anxiety disorder) 11/20/2015  . Essential hypertension   . Cellulitis 09/04/2015  . Cellulitis of left axilla   . Esophageal ulcer without bleeding   . Bilateral lower extremity edema 04/28/2014  . BPH (benign prostatic hyperplasia) 10/08/2012  . Anxiety   . EKG abnormalities 09/05/2012  . Tremor 09/05/2012  . Chronic fatigue 09/05/2012  . Arthritis   .  Asthma, chronic   . Reflux esophagitis   . S/P CABG (coronary artery bypass graft)   . GERD (gastroesophageal reflux disease)   . Hyperlipidemia   . Hypertension   . UNSPECIFIED PERIPHERAL VASCULAR DISEASE 06/16/2009    Electa Sniff, PT, DPT, NCS 09/03/2020, 1:09 PM  [facility] [address] [place], [place], [address] Phone: [phone]   Fax:  [phone]  Name: RAIFE LIZER MRN: [id number] Date of Birth: [birth year]

## 2020-09-03 NOTE — Patient Instructions (Signed)
     Cane Overhead - Standing   Sit, With arms straight, hold cane forward at waist. Raise cane up SLOWLY.   KEEP ARMS STRAIGHT/LONG ALL THE WAY UP AND ALL THE WAY BACK DOWN. Repeat 5 times. REST AND THEN DO 5 MORE.

## 2020-09-03 NOTE — Patient Instructions (Signed)
Access Code: [email or web address] URL: [email or web address] Date: 09/03/2020 Prepared by: Cherly Anderson  Exercises Supine Bridge - 1 x daily - 5 x weekly - 2 sets - 5 reps Seated Long Arc Quad with Ankle Weight - 2 x daily - 5 x weekly - 1 sets - 10 reps Seated March - 2 x daily - 5 x weekly - 1 sets - 10 reps Sit to Stand - 2 x daily - 5 x weekly - 1 sets - 5 reps

## 2020-09-03 NOTE — Therapy (Signed)
[facility] [address] [place], [place], [address] Phone: [phone]   Fax:  [phone]  Occupational Therapy Treatment  Patient Details  Name: Evan [name] MRN: [id number] Date of Birth: 1927/04/19 Referring Provider (OT): Dr. Jamse Arn   Encounter Date: 09/03/2020   OT End of Session - 09/03/20 0851    Visit Number 3    Number of Visits 17    Date for OT Re-Evaluation 10/24/20    Authorization Type [employer] [place] - Visit Number 3    Authorization - Number of Visits 10    Progress Note Due on Visit 10    OT Start Time 0849    OT Stop Time 0929    OT Time Calculation (min) 40 min    Activity Tolerance Patient tolerated treatment well    Behavior During Therapy [facility] for tasks assessed/performed           Past Medical History:  Diagnosis Date  . Adenocarcinoma of right lung ([place]) 01/06/2016  . Anginal chest pain at rest [facility])    Chronic non, controlled on Lorazepam  . Anxiety   . Arthritis    "all over"   . BPH (benign prostatic hyperplasia)   . CAD (coronary artery disease)   . Chronic lower back pain   . Complication of anesthesia    "problems making water afterwards"  . Depression   . DJD (degenerative joint disease)   . Esophageal ulcer without bleeding    bid ppi indefinitely  . Family history of adverse reaction to anesthesia    "children w/PONV"  . GERD (gastroesophageal reflux disease)   . Headache    "couple/week maybe" (09/04/2015)  . Hemochromatosis    Possible, elevated iron stores, hook-like osteophytes on hand films, normal LFTs  . Hepatitis    "yellow jaundice as a baby"  . History of ASCVD    MULTIVESSEL  . Hyperlipidemia   . Hypertension   . Osteoarthritis   . Pneumonia    "several times; got a little now" (09/04/2015)  . Rheumatoid arthritis ([place])   . Subdural hematoma ([facility])    small after fall 09/2016-plavix held, neurosurgery consulted.   Asymptomatic.    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 ARM SKIN LESION BIOPSY / EXCISION Left 09/02/2015  . CATARACT EXTRACTION W/ INTRAOCULAR LENS  IMPLANT, BILATERAL Bilateral   . CHOLECYSTECTOMY OPEN    . CORNEAL TRANSPLANT Bilateral    "one at [facility]; one at [facility]"  . CORONARY ANGIOPLASTY WITH STENT PLACEMENT    . CORONARY ARTERY BYPASS GRAFT  1999  . DESCENDING AORTIC ANEURYSM REPAIR W/ STENT     DE stent ostium into the right radial free graft at OM-, 02-2007  . ESOPHAGOGASTRODUODENOSCOPY N/A 11/09/2014   Procedure: ESOPHAGOGASTRODUODENOSCOPY (EGD);  Surgeon: [name], MD;  Location[facility] ENDOSCOPY;  Service: Endoscopy;  Laterality: N/A;  . INGUINAL HERNIA REPAIR    . JOINT REPLACEMENT    . LEFT HEART CATH AND CORS/GRAFTS ANGIOGRAPHY N/A 06/27/2017   Procedure: LEFT HEART CATH AND CORS/GRAFTS ANGIOGRAPHY;  Surgeon: [name], MD;  Location: [facility];  Service: Cardiovascular;  Laterality: N/A;  . NASAL SINUS SURGERY    . SHOULDER OPEN ROTATOR CUFF REPAIR Right   . TOTAL KNEE ARTHROPLASTY Bilateral     There were no vitals filed for this  visit.   Subjective Assessment - 09/03/20 0850    Subjective  Denies pain    Pertinent History hx of Thalamic hemorrhage.        PMH:  HOH, CAD, RA, Hx of R RTC repair, hx of bilateral knee arthroplasty, hx of CABG, hx of aortic andurysm repair, hx of corneal transplant. hx of chronic low back pain, depression, R lung adenocarcinoma, posssible radiation (not active) pneumonitis, A fib, hx of DVT/PE--on treatment dose Lovenox, SDH 09/2016, chronic thrombocytopenia    Patient Stated Goals incr RUE functional use    Currently in Pain? No/denies           Reviewed RUE functional use HEP (min-mod verbal/tactile cues for slow controlled movement, keeping elbow/shoulder down):  Wiping table, folding wash cloth, picking up small bottles,  stabilizing bottle with RUE while removing/replacing lid with LUE.  Picking up 1-inch blocks using 2-point pinch and placing in container with min-mod cues/min facilitation for slow/controlled movements and normal movement patterns.    Picking up cup and bringing to mouth with min facilitation, mod cueing for normal movement patterns, slow/controlled movement, improved with repetition.  Cane ex in sitting for shoulder flex with mod cueing for elbow ext.  2 sets of 5 (due to fatigue).       OT Education - 09/03/20 1041    Education Details Reviewed HEP for RUE functional use and pt returned demo with min-mod cueing--emphasized slow, controlled movements and keeping elbow/shoulder down (recommended caregiver swing arm rests of w/c out of the way to decr shoulder compensation); reviewed recommendation to stop previous theraband HEP for now due to decr RUE control to avoid risk of injury; modified shoulder flex cane exercises (focus on elbow ext).    Person(s) Educated Patient;Child(ren)    Methods Explanation;Demonstration;Verbal cues;Handout;Tactile cues    Comprehension Verbalized understanding;Returned demonstration;Verbal cues required;Tactile cues required            OT Short Term Goals - 08/25/20 1509      OT SHORT TERM GOAL #1   Title Pt/family will be independent with HEP for incr RUE functional use.--check STGs 09/24/20    Time 4    Period Weeks    Status New      OT SHORT TERM GOAL #2   Title Pt will improve LUE coordination and low level functional reaching as shown by ability to grasp/release cylinder object in at least 3/5 trials.    Time 4    Period Weeks    Status New      OT SHORT TERM GOAL #3   Title Pt will be able to pick up 1" object using pinch at least 2/5 trials for incr coordination for ADLs.    Time 4    Period Weeks    Status New      OT SHORT TERM GOAL #4   Title --      OT SHORT TERM GOAL #5   Title --             OT Long Term Goals -  08/25/20 1513      OT LONG TERM GOAL #1   Title Pt will incorporate RUE functionally in at least 3 additional ADL tasks consistently.    Baseline currently only using to push up/reach back for w/c and lock w/c brake with cues    Time 8    Period Weeks    Status New      OT LONG TERM GOAL #2   Title Pt will  improve RUE coordination as shown by improving score on box and blocks test by at least 4.    Baseline 4 blocks.    Time 8    Period Weeks    Status New      OT LONG TERM GOAL #3   Title Pt will improve LUE coordination and functional reaching to midlevel as shown by ability to grasp/release cylinder object in at least 3/5 trials.    Time 8    Period Weeks    Status New      OT LONG TERM GOAL #4   Title Pt will be able to pick up 1" object using pinch and place in bowl at least 4/5 trials for incr coordination for ADLs.    Time 8    Period Weeks    Status New                 Plan - 09/03/20 1751    Clinical Impression Statement Pt is progressing towards goals with understanding of HEP, but fatigues quickly and needs rest breaks.  Pt instructed to move slow and keep elbow down with improved control noted (caregiver verbalized understanding and can cue pt at home).    OT Occupational Profile and History Detailed Assessment- Review of Records and additional review of physical, cognitive, psychosocial history related to current functional performance    Occupational performance deficits (Please refer to evaluation for details): ADL's;IADL's    Body Structure / Function / Physical Skills ADL;Decreased knowledge of use of DME;Strength;UE functional use;ROM;FMC;GMC;Dexterity;Mobility;Coordination    Rehab Potential Fair    Clinical Decision Making Several treatment options, min-mod task modification necessary    Comorbidities Affecting Occupational Performance: May have comorbidities impacting occupational performance    Modification or Assistance to Complete Evaluation   Min-Moderate modification of tasks or assist with assess necessary to complete eval    OT Frequency 2x / week    OT Duration 8 weeks   +eval, may discharge after 6 weeks depending on progress   OT Treatment/Interventions Moist Heat;Fluidtherapy;Self-care/ADL training;DME and/or AE instruction;Splinting;Therapeutic activities;Therapeutic exercise;Neuromuscular education;Functional Mobility Training;Passive range of motion;Patient/family education;Manual Therapy    Plan RUE functional use, review cane shoulder flex; emphasis on slow, controlled movements    OT Home Exercise Plan Pt has been doing shoulder flex with cane and theraband exercises (shoulder ext, scapular retraction, horizontal abduction) per dtr--instructed to hold on theraband exercises due to decr control RUE    Consulted and Agree with Plan of Care Patient;Family member/caregiver    Family Member Consulted daughter, son           Patient will benefit from skilled therapeutic intervention in order to improve the following deficits and impairments:   Body Structure / Function / Physical Skills: ADL,Decreased knowledge of use of DME,Strength,UE functional use,ROM,FMC,GMC,Dexterity,Mobility,Coordination       Visit Diagnosis: Other lack of coordination  Ataxia  Muscle weakness (generalized)  Other symptoms and signs involving the nervous system  Other symptoms and signs involving the musculoskeletal system    Problem List Patient Active Problem List   Diagnosis Date Noted  . Rib pain on right side 03/19/2020  . Sundowning 07/04/2019  . Bilateral hearing loss 04/16/2019  . Urinary frequency 03/21/2019  . Chronic right shoulder pain 12/19/2018  . Weakness generalized 09/28/2018  . Palliative care encounter 09/28/2018  . Pressure injury of skin 08/27/2018  . COPD (chronic obstructive pulmonary disease) ([name]) 08/26/2018  . AMS (altered mental status) 08/22/2018  . Altered mental status 08/21/2018  .  UTI (urinary  tract infection) 08/21/2018  . Abnormality of gait 08/20/2018  . Prolonged Q-T interval on ECG   . Dysphagia, post-stroke   . Supplemental oxygen dependent   . Anxiety state   . Fatigue   . SOB (shortness of breath)   . Hyperglycemia   . History of lung cancer   . Depression   . CKD (chronic kidney disease), stage III ([place])   . Acute blood loss anemia   . Fall 07/16/2018  . 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 Acute spontaneous 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 ([place]) L thalamic 07/14/2018  . Cellulitis of arm, right 06/01/2018  . Rash and nonspecific skin eruption 06/01/2018  . Chest pain 02/02/2018  . Thrombocytopenia ([place]) 02/02/2018  . Hypokalemia 02/02/2018  . Pulmonary embolism ([place]) 02/02/2018  . Elevated troponin 02/02/2018  . Pulmonary embolus ([place]) 02/02/2018  . Non-ST elevation MI (NSTEMI) ([place]) 06/27/2017  . Diastolic CHF ([place]) 30/12/2328  . Chest pain due to CAD ([place]) 06/26/2017  . Dyspnea 06/21/2017  . Acute bronchitis 05/15/2017  . Oral candidiasis 05/15/2017  . Acute pericarditis   . Chest pain at rest 12/20/2016  . Angina pectoris ([place]) 12/20/2016  . Radiation pneumonitis ([place]) 11/09/2016  . Pleurisy 10/11/2016  . Chest wall pain 10/11/2016  . Atrial fibrillation ([place]) 05/21/2016  . Adenocarcinoma of right lung ([place]) 01/06/2016  . GAD (generalized anxiety disorder) 11/20/2015  . Essential hypertension   . Cellulitis 09/04/2015  . Cellulitis of left axilla   . Esophageal ulcer without bleeding   . Bilateral lower extremity edema 04/28/2014  . BPH (benign prostatic hyperplasia) 10/08/2012  . Anxiety   . EKG abnormalities 09/05/2012  . Tremor 09/05/2012  . Chronic fatigue 09/05/2012  . Arthritis   . Asthma, chronic   . Reflux esophagitis   . S/P CABG (coronary artery bypass graft)   . GERD (gastroesophageal reflux disease)   . Hyperlipidemia   . Hypertension   .  UNSPECIFIED PERIPHERAL VASCULAR DISEASE 06/16/2009    [facility] 09/03/2020, 10:50 AM  [facility] [address] [place], [place], [address] Phone: [phone]   Fax:  [phone]  Name: Evan [name] MRN: [id number] Date of Birth: [birth year]   Vianne Bulls, OTR/L [facility] [address] [place], [place]  [address] [phone] phone [phone] 09/03/20 10:50 AM

## 2020-09-08 ENCOUNTER — Other Ambulatory Visit (HOSPITAL_COMMUNITY): Payer: Self-pay

## 2020-09-08 ENCOUNTER — Ambulatory Visit: Payer: Medicare HMO | Admitting: Occupational Therapy

## 2020-09-08 ENCOUNTER — Other Ambulatory Visit: Payer: Self-pay

## 2020-09-08 ENCOUNTER — Ambulatory Visit: Payer: Medicare HMO

## 2020-09-08 DIAGNOSIS — R29898 Other symptoms and signs involving the musculoskeletal system: Secondary | ICD-10-CM | POA: Diagnosis not present

## 2020-09-08 DIAGNOSIS — R2681 Unsteadiness on feet: Secondary | ICD-10-CM | POA: Diagnosis not present

## 2020-09-08 DIAGNOSIS — R278 Other lack of coordination: Secondary | ICD-10-CM | POA: Diagnosis not present

## 2020-09-08 DIAGNOSIS — R270 Ataxia, unspecified: Secondary | ICD-10-CM

## 2020-09-08 DIAGNOSIS — M6281 Muscle weakness (generalized): Secondary | ICD-10-CM

## 2020-09-08 DIAGNOSIS — R29818 Other symptoms and signs involving the nervous system: Secondary | ICD-10-CM | POA: Diagnosis not present

## 2020-09-08 DIAGNOSIS — R2689 Other abnormalities of gait and mobility: Secondary | ICD-10-CM | POA: Diagnosis not present

## 2020-09-08 DIAGNOSIS — R262 Difficulty in walking, not elsewhere classified: Secondary | ICD-10-CM | POA: Diagnosis not present

## 2020-09-08 MED ORDER — SULFAMETHOXAZOLE-TRIMETHOPRIM 800-160 MG PO TABS
ORAL_TABLET | ORAL | 0 refills | Status: DC
  Filled 2020-09-08: qty 14, 7d supply, fill #0

## 2020-09-08 NOTE — Therapy (Signed)
[facility] [address] [place], [place], [address] Phone: [phone]   Fax:  [phone]  Physical Therapy Treatment  Patient Details  Name: Evan [name] MRN: [id number] Date of Birth: May 16, 1927 Referring Provider (PT): Delice Lesch, MD   Encounter Date: 09/08/2020   PT End of Session - 09/08/20 1017    Visit Number 3    Number of Visits 13    Date for PT Re-Evaluation 10/24/20   POC for 6 weeks, Cert for 60 days   Authorization Type [employer] (10th Visit PN)    Authorization Time Period Awaiting Authorization Approval    PT Start Time 1015    PT Stop Time 1056    PT Time Calculation (min) 41 min    Equipment Utilized During Treatment Gait belt    Activity Tolerance Patient tolerated treatment well;Patient limited by fatigue    Behavior During Therapy [facility] for tasks assessed/performed           Past Medical History:  Diagnosis Date  . Adenocarcinoma of right lung ([place]) 01/06/2016  . Anginal chest pain at rest [facility])    Chronic non, controlled on Lorazepam  . Anxiety   . Arthritis    "all over"   . BPH (benign prostatic hyperplasia)   . CAD (coronary artery disease)   . Chronic lower back pain   . Complication of anesthesia    "problems making water afterwards"  . Depression   . DJD (degenerative joint disease)   . Esophageal ulcer without bleeding    bid ppi indefinitely  . Family history of adverse reaction to anesthesia    "children w/PONV"  . GERD (gastroesophageal reflux disease)   . Headache    "couple/week maybe" (09/04/2015)  . Hemochromatosis    Possible, elevated iron stores, hook-like osteophytes on hand films, normal LFTs  . Hepatitis    "yellow jaundice as a baby"  . History of ASCVD    MULTIVESSEL  . Hyperlipidemia   . Hypertension   . Osteoarthritis   . Pneumonia    "several times; got a little now" (09/04/2015)  . Rheumatoid arthritis ([place])   . Subdural hematoma ([facility])     small after fall 09/2016-plavix held, neurosurgery consulted.  Asymptomatic.    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 ARM SKIN LESION BIOPSY / EXCISION Left 09/02/2015  . CATARACT EXTRACTION W/ INTRAOCULAR LENS  IMPLANT, BILATERAL Bilateral   . CHOLECYSTECTOMY OPEN    . CORNEAL TRANSPLANT Bilateral    "[facility] at [facility]; one at [facility]"  . CORONARY ANGIOPLASTY WITH STENT PLACEMENT    . CORONARY ARTERY BYPASS GRAFT  1999  . DESCENDING AORTIC ANEURYSM REPAIR W/ STENT     DE stent ostium into the right radial free graft at OM-, 02-2007  . ESOPHAGOGASTRODUODENOSCOPY N/A 11/09/2014   Procedure: ESOPHAGOGASTRODUODENOSCOPY (EGD);  Surgeon: [name], MD;  Location: [facility] ENDOSCOPY;  Service: Endoscopy;  Laterality: N/A;  . INGUINAL HERNIA REPAIR    . JOINT REPLACEMENT    . LEFT HEART CATH AND CORS/GRAFTS ANGIOGRAPHY N/A 06/27/2017   Procedure: LEFT HEART CATH AND CORS/GRAFTS ANGIOGRAPHY;  Surgeon: [name], MD;  Location: [facility] CV LAB;  Service: Cardiovascular;  Laterality: N/A;  . NASAL SINUS SURGERY    . SHOULDER OPEN ROTATOR CUFF REPAIR Right   . TOTAL KNEE ARTHROPLASTY Bilateral     There were  no vitals filed for this visit.   Subjective Assessment - 09/08/20 1017    Subjective Patient denies any new issues. Daughter reports he felt ok after last session. They have tried the exercises.    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 No/denies                             [facility] Adult PT Treatment/Exercise - 09/08/20 1018      Transfers    Transfers Sit to Stand;Stand to [employer]    Sit to Stand 4: Min guard    Sit to Stand Details Verbal cues for technique    Sit to Stand Details (indicate cue type and reason) Verbal cues to reach back for chair to push prior to standing and not put hand on cane to rise.    Stand to Sit 4: Min guard    Stand Pivot Transfers 4: Min guard;4: Lexicographer Details (indicate cue type and reason) w/c to mat holding to therapist arm      Ambulation/Gait   Ambulation/Gait Yes    Ambulation/Gait Assistance 4: Min guard;4: Min assist    Ambulation/Gait Assistance Details Pt ambulated 1st 2 bouts with [facility] with w/c follow verbal cues for upright posture. Pt did well with taking his time and focusing on right foot placement. Pt fatigued quickly. Last bout trialed FWW with PT helping to be sure right hand stayed in place to try to facilitate some WB through right arm to get more right weight shift.    Ambulation Distance (Feet) 15 Feet   15' x 1 with SBQC, 10' x 1 with FWW   Assistive device Small based quad cane;Rolling walker   right double upright AFO with t strap   Gait Pattern Step-to pattern;Step-through pattern;Decreased step length - right;Decreased step length - left;Narrow base of support    Ambulation Surface Level;Indoor      Neuro Re-ed    Neuro Re-ed Details  Standing at [facility] x 40 sec CGA working on keeping upright posture. As pt went on he had increased sway with unsteadiness in RLE. Standing at [facility] stepping forwards and backwards x 5 with each foot with seated rest in between.Standing with tapping 2 different colored therastones with LLE on command to increase right weight shift CGA/min assist.      Exercises   Exercises Other Exercises;Knee/Hip      Knee/Hip Exercises: Aerobic   Other Aerobic Sci Fit x 3 min level 2 with BLE with encouragement to keep going. Reports medium fatigue after. HR=64                  PT Education - 09/08/20 1140     Education Details Pt to conitnue with current HEP    Person(s) Educated Patient;Child(ren)    Methods Explanation    Comprehension Verbalized understanding            PT Short Term Goals - 08/25/20 1328      PT SHORT TERM GOAL #1   Title Patient will be independent with initial HEP with family member assistance to improve functional  outcomes (All STGs Due: 09/15/20)    Baseline HEP from prior POC    Time 3    Period Weeks    Status New    Target Date 09/15/20      PT SHORT TERM GOAL #2   Title Patient will be able to perform 5x sit <> stand in < 45 seconds to demonstrate improvement in functional mobility and reduced risk for falls.    Baseline unable to complete, 3 reps in 22.37 secs    Time 3    Period Weeks    Status New      PT SHORT TERM GOAL #3   Title Patient will be able to ambulate for >/= 50 ft with LBQC vs RW for improved household mobility    Baseline 20 ft    Time 3    Period Weeks    Status New      PT SHORT TERM GOAL #4   Title Patient will be able to stand without UE support and close supervision for >/= 1 minute for improved tolerance for ADLs    Baseline 20-30 secs, CGA    Time 3    Period Weeks    Status New             PT Long Term Goals - 08/25/20 1334      PT LONG TERM GOAL #1   Title Patient will be independent with final HEP for strength/balance with family member assistance (All LTGs Due: 10/06/20)    Baseline HEP from prior POC    Time 6    Period Weeks    Status New    Target Date 10/06/20      PT LONG TERM GOAL #2   Title Patient will be able to complete 5x sit <> stand in </= 30 seconds with CGA to demo improved balance and functional mobility    Baseline unable to complete    Time 6    Period Weeks    Status New      PT LONG TERM GOAL #3   Title Patient will demo ability to complete transfers from w/c <> mat with LRAD and supervision    Baseline CGA - Min A    Time 6    Period Weeks    Status New      PT LONG TERM  GOAL #4   Title Patient will be able to stand >/= 2 minutes without UE support to improve tolerance for ADLs    Baseline < 30 seconds    Time 6    Period Weeks    Status New      PT LONG TERM GOAL #5   Title Patient will be able to ambulate for >/= 100 ft with LBQC vs RW for improved household mobility    Baseline 20 ft    Time 6    Period Weeks    Status New                 Plan - 09/08/20 1141    Clinical Impression Statement PT focused on trying to increase standing time today to improve activity tolerance with weight shifting activities. Pt did have better control with right foot placement with gait but fatigues easily. Trialed FWW to try to get some RUE WB and get right arm out of flexion. May continue to trial some in therapy to work on the RUE WB and trying to increase right weight shift.    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, lumbar fracture December 2020    Examination-Activity Limitations Bed Mobility;Locomotion Level;Sit;Stand;Transfers    Examination-Participation Restrictions Medication Management;Community Activity;Interpersonal Relationship    Stability/Clinical Decision Making Evolving/Moderate complexity    Rehab Potential Fair    PT Frequency 2x / week    PT Duration 6 weeks    PT Treatment/Interventions ADLs/Self Care Home Management;Cryotherapy;Moist Heat;DME Instruction;Gait training;Stair training;Functional mobility training;Therapeutic activities;Therapeutic exercise;Balance training;Neuromuscular re-education;Patient/family education;Orthotic Fit/Training;Manual techniques;Wheelchair mobility training;Passive range of motion    PT Next Visit Plan Continue gait training with [facility] and with RW (may want to try hand  grip attachment) to tro to get right arm out of flexed posture and get some weight bearing through it to help with weight shift.  Work on improving standing tolerance.    Consulted and Agree with Plan of Care Patient;Family member/caregiver    Family Member Consulted Daughter/Son           Patient will benefit from skilled therapeutic intervention in order to improve the following deficits and impairments:  Abnormal gait,Decreased coordination,Decreased range of motion,Difficulty walking,Impaired tone,Pain,Impaired UE functional use,Decreased safety awareness,Decreased endurance,Decreased activity tolerance,Decreased knowledge of use of DME,Decreased balance,Decreased cognition,Decreased strength,Impaired sensation  Visit Diagnosis: Other abnormalities of gait and mobility  Muscle weakness (generalized)     Problem List Patient Active Problem List   Diagnosis Date Noted  . Rib pain on right side 03/19/2020  . Sundowning 07/04/2019  . Bilateral hearing loss 04/16/2019  . Urinary frequency 03/21/2019  . Chronic right shoulder pain 12/19/2018  . Weakness generalized 09/28/2018  . Palliative care encounter 09/28/2018  . Pressure injury of skin 08/27/2018  . COPD (chronic obstructive pulmonary disease) ([place]) 08/26/2018  . AMS (altered mental status) 08/22/2018  . Altered mental status 08/21/2018  . UTI (urinary tract infection) 08/21/2018  . Abnormality of gait 08/20/2018  . Prolonged Q-T interval on ECG   . Dysphagia, post-stroke   . Supplemental oxygen dependent   . Anxiety state   . Fatigue   . SOB (shortness of breath)   . Hyperglycemia   . History of lung cancer   . Depression   . CKD (chronic kidney disease), stage III ([place])   . Acute blood loss anemia   . Fall 07/16/2018  . 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 Thalamic hemorrhage ([place]) 07/16/2018  . Acute spontaneous 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 ([place]) L thalamic 07/14/2018   . Cellulitis of arm, right 06/01/2018  . Rash and nonspecific skin eruption 06/01/2018  . Chest pain 02/02/2018  . Thrombocytopenia ([place]) 02/02/2018  . Hypokalemia 02/02/2018  . Pulmonary embolism ([place]) 02/02/2018  . Elevated troponin 02/02/2018  . Pulmonary embolus ([place]) 02/02/2018  . Non-ST elevation MI (NSTEMI) ([place]) 06/27/2017  . Diastolic CHF ([place]) 42/35/3614  . Chest pain due to CAD ([place]) 06/26/2017  . Dyspnea 06/21/2017  . Acute bronchitis 05/15/2017  . Oral candidiasis 05/15/2017  . Acute pericarditis   . Chest pain at rest 12/20/2016  . Angina pectoris ([place]) 12/20/2016  . Radiation pneumonitis ([place]) 11/09/2016  . Pleurisy 10/11/2016  . Chest wall pain 10/11/2016  . Atrial fibrillation ([place]) 05/21/2016  . Adenocarcinoma of right lung ([place]) 01/06/2016  . GAD (generalized anxiety disorder) 11/20/2015  . Essential hypertension   . Cellulitis 09/04/2015  . Cellulitis of left axilla   . Esophageal ulcer without  bleeding   . Bilateral lower extremity edema 04/28/2014  . BPH (benign prostatic hyperplasia) 10/08/2012  . Anxiety   . EKG abnormalities 09/05/2012  . Tremor 09/05/2012  . Chronic fatigue 09/05/2012  . Arthritis   . Asthma, chronic   . Reflux esophagitis   . S/P CABG (coronary artery bypass graft)   . GERD (gastroesophageal reflux disease)   . Hyperlipidemia   . Hypertension   . UNSPECIFIED PERIPHERAL VASCULAR DISEASE 06/16/2009    Electa Sniff, PT, DPT, NCS 09/08/2020, 11:44 AM  [facility] [address] [place], [place], [address] Phone: [phone]   Fax:  [phone]  Name: Evan [name] MRN: [id number] Date of Birth: Feb 13, 1928

## 2020-09-08 NOTE — Therapy (Signed)
[facility] [address] [place], [place], [address] Phone: [phone]   Fax:  [phone]  Occupational Therapy Treatment  Patient Details  Name: Evan [name] MRN: [id number] Date of Birth: October 24, 1927 Referring Provider (OT): Dr. Jamse Arn   Encounter Date: 09/08/2020   OT End of Session - 09/08/20 1030    Visit Number 4    Number of Visits 17    Date for OT Re-Evaluation 10/24/20    Authorization Type [employer] [place] - Visit Number 4    Authorization - Number of Visits 10    Progress Note Due on Visit 10           Past Medical History:  Diagnosis Date  . Adenocarcinoma of right lung ([place]) 01/06/2016  . Anginal chest pain at rest [facility])    Chronic non, controlled on Lorazepam  . Anxiety   . Arthritis    "all over"   . BPH (benign prostatic hyperplasia)   . CAD (coronary artery disease)   . Chronic lower back pain   . Complication of anesthesia    "problems making water afterwards"  . Depression   . DJD (degenerative joint disease)   . Esophageal ulcer without bleeding    bid ppi indefinitely  . Family history of adverse reaction to anesthesia    "children w/PONV"  . GERD (gastroesophageal reflux disease)   . Headache    "couple/week maybe" (09/04/2015)  . Hemochromatosis    Possible, elevated iron stores, hook-like osteophytes on hand films, normal LFTs  . Hepatitis    "yellow jaundice as a baby"  . History of ASCVD    MULTIVESSEL  . Hyperlipidemia   . Hypertension   . Osteoarthritis   . Pneumonia    "several times; got a little now" (09/04/2015)  . Rheumatoid arthritis ([place])   . Subdural hematoma ([facility])    small after fall 09/2016-plavix held, neurosurgery consulted.  Asymptomatic.    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 ARM SKIN LESION BIOPSY / EXCISION  Left 09/02/2015  . CATARACT EXTRACTION W/ INTRAOCULAR LENS  IMPLANT, BILATERAL Bilateral   . CHOLECYSTECTOMY OPEN    . CORNEAL TRANSPLANT Bilateral    "one at [facility]; one at [facility]"  . CORONARY ANGIOPLASTY WITH STENT PLACEMENT    . CORONARY ARTERY BYPASS GRAFT  1999  . DESCENDING AORTIC ANEURYSM REPAIR W/ STENT     DE stent ostium into the right radial free graft at OM-, 02-2007  . ESOPHAGOGASTRODUODENOSCOPY N/A 11/09/2014   Procedure: ESOPHAGOGASTRODUODENOSCOPY (EGD);  Surgeon: [name], MD;  Location: [facility] ENDOSCOPY;  Service: Endoscopy;  Laterality: N/A;  . INGUINAL HERNIA REPAIR    . JOINT REPLACEMENT    . LEFT HEART CATH AND CORS/GRAFTS ANGIOGRAPHY N/A 06/27/2017   Procedure: LEFT HEART CATH AND CORS/GRAFTS ANGIOGRAPHY;  Surgeon: [name], MD;  Location: [facility] CV LAB;  Service: Cardiovascular;  Laterality: N/A;  . NASAL SINUS SURGERY    . SHOULDER OPEN ROTATOR CUFF REPAIR Right   . TOTAL KNEE ARTHROPLASTY Bilateral     There were no vitals filed for this visit.   Subjective Assessment - 09/08/20 1242    Subjective  Denies pain    Pertinent History hx of Thalamic hemorrhage.        PMH:  HOH, CAD, RA, Hx of R RTC repair, hx  of bilateral knee arthroplasty, hx of CABG, hx of aortic andurysm repair, hx of corneal transplant. hx of chronic low back pain, depression, R lung adenocarcinoma, posssible radiation (not active) pneumonitis, A fib, hx of DVT/PE--on treatment dose Lovenox, SDH 09/2016, chronic thrombocytopenia    Patient Stated Goals incr RUE functional use    Currently in Pain? No/denies                 Treatment: Functional reaching to place and remove wooden dowel pegs from pegboard, mod-max difficulty/ facilitation due to ataxia Folding washcloths with bilateral UE's mod facilitation/ v.c Arm bike x 3 mins total, for conditioning, rest break halfway, min-mod v.c Picking up 1 inch blocks to place in container with RUE, min difficulty/  v.c              OT Education - 09/08/20 1241    Education Details cane exercise for shoulder flexion and chest press, min-mod v.c and facilitation 10 reps each   Person(s) Educated Patient;Child(ren)    Methods Explanation;Demonstration;Verbal cues;Handout;Tactile cues    Comprehension Verbalized understanding;Returned demonstration;Verbal cues required;Tactile cues required            OT Short Term Goals - 08/25/20 1509      OT SHORT TERM GOAL #1   Title Pt/family will be independent with HEP for incr RUE functional use.--check STGs 09/24/20    Time 4    Period Weeks    Status New      OT SHORT TERM GOAL #2   Title Pt will improve LUE coordination and low level functional reaching as shown by ability to grasp/release cylinder object in at least 3/5 trials.    Time 4    Period Weeks    Status New      OT SHORT TERM GOAL #3   Title Pt will be able to pick up 1" object using pinch at least 2/5 trials for incr coordination for ADLs.    Time 4    Period Weeks    Status New      OT SHORT TERM GOAL #4   Title --      OT SHORT TERM GOAL #5   Title --             OT Long Term Goals - 08/25/20 1513      OT LONG TERM GOAL #1   Title Pt will incorporate RUE functionally in at least 3 additional ADL tasks consistently.    Baseline currently only using to push up/reach back for w/c and lock w/c brake with cues    Time 8    Period Weeks    Status New      OT LONG TERM GOAL #2   Title Pt will improve RUE coordination as shown by improving score on box and blocks test by at least 4.    Baseline 4 blocks.    Time 8    Period Weeks    Status New      OT LONG TERM GOAL #3   Title Pt will improve LUE coordination and functional reaching to midlevel as shown by ability to grasp/release cylinder object in at least 3/5 trials.    Time 8    Period Weeks    Status New      OT LONG TERM GOAL #4   Title Pt will be able to pick up 1" object using pinch and place in  bowl at least 4/5 trials for incr coordination for ADLs.  Time 8    Period Weeks    Status New                 Plan - 09/08/20 1239    Clinical Impression Statement Pt is progressing towards goals . He continues to require v.c  to move slow and keep elbow down during functional activities.    OT Occupational Profile and History Detailed Assessment- Review of Records and additional review of physical, cognitive, psychosocial history related to current functional performance    Occupational performance deficits (Please refer to evaluation for details): ADL's;IADL's    Body Structure / Function / Physical Skills ADL;Decreased knowledge of use of DME;Strength;UE functional use;ROM;FMC;GMC;Dexterity;Mobility;Coordination    Rehab Potential Fair    OT Frequency 2x / week    OT Duration 8 weeks    Plan RUE functional use,  emphasis on slow, controlled movements    OT Home Exercise Plan Pt has been doing shoulder flex with cane and theraband exercises (shoulder ext, scapular retraction, horizontal abduction) per dtr--instructed to hold on theraband exercises due to decr control RUE    Consulted and Agree with Plan of Care Patient;Family member/caregiver    Family Member Consulted daughter,           Patient will benefit from skilled therapeutic intervention in order to improve the following deficits and impairments:   Body Structure / Function / Physical Skills: ADL,Decreased knowledge of use of DME,Strength,UE functional use,ROM,FMC,GMC,Dexterity,Mobility,Coordination       Visit Diagnosis: Muscle weakness (generalized)  Other lack of coordination  Ataxia  Other symptoms and signs involving the nervous system  Other symptoms and signs involving the musculoskeletal system    Problem List Patient Active Problem List   Diagnosis Date Noted  . Rib pain on right side 03/19/2020  . Sundowning 07/04/2019  . Bilateral hearing loss 04/16/2019  . Urinary frequency 03/21/2019   . Chronic right shoulder pain 12/19/2018  . Weakness generalized 09/28/2018  . Palliative care encounter 09/28/2018  . Pressure injury of skin 08/27/2018  . COPD (chronic obstructive pulmonary disease) ([place]) 08/26/2018  . AMS (altered mental status) 08/22/2018  . Altered mental status 08/21/2018  . UTI (urinary tract infection) 08/21/2018  . Abnormality of gait 08/20/2018  . Prolonged Q-T interval on ECG   . Dysphagia, post-stroke   . Supplemental oxygen dependent   . Anxiety state   . Fatigue   . SOB (shortness of breath)   . Hyperglycemia   . History of lung cancer   . Depression   . CKD (chronic kidney disease), stage III ([place])   . Acute blood loss anemia   . Fall 07/16/2018  . 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 Thalamic hemorrhage ([place]) 07/16/2018  . Acute spontaneous 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 ([place]) L thalamic 07/14/2018  . Cellulitis of arm, right 06/01/2018  . Rash and nonspecific skin eruption 06/01/2018  . Chest pain 02/02/2018  . Thrombocytopenia ([place]) 02/02/2018  . Hypokalemia 02/02/2018  . Pulmonary embolism ([place]) 02/02/2018  . Elevated troponin 02/02/2018  . Pulmonary embolus ([place]) 02/02/2018  . Non-ST elevation MI (NSTEMI) ([place]) 06/27/2017  . Diastolic CHF ([place]) 48/54/6270  . Chest pain due to CAD ([place]) 06/26/2017  . Dyspnea 06/21/2017  . Acute bronchitis 05/15/2017  . Oral candidiasis 05/15/2017  . Acute pericarditis   . Chest pain at rest 12/20/2016  . Angina pectoris ([place]) 12/20/2016  . Radiation pneumonitis ([place]) 11/09/2016  . Pleurisy 10/11/2016  . Chest wall pain 10/11/2016  . Atrial  fibrillation ([place]) 05/21/2016  . Adenocarcinoma of right lung ([place]) 01/06/2016  . GAD (generalized anxiety disorder) 11/20/2015  . Essential hypertension   . Cellulitis 09/04/2015  . Cellulitis of left axilla   . Esophageal ulcer without bleeding   . Bilateral lower extremity edema 04/28/2014  . BPH (benign prostatic  hyperplasia) 10/08/2012  . Anxiety   . EKG abnormalities 09/05/2012  . Tremor 09/05/2012  . Chronic fatigue 09/05/2012  . Arthritis   . Asthma, chronic   . Reflux esophagitis   . S/P CABG (coronary artery bypass graft)   . GERD (gastroesophageal reflux disease)   . Hyperlipidemia   . Hypertension   . UNSPECIFIED PERIPHERAL VASCULAR DISEASE 06/16/2009    Angelea Penny 09/08/2020, 12:43 PM  [facility] [address] [place], [place], [address] Phone: [phone]   Fax:  [phone]  Name: Evan [name] MRN: [id number] Date of Birth: [birth year]

## 2020-09-10 ENCOUNTER — Ambulatory Visit: Payer: Medicare HMO | Admitting: Occupational Therapy

## 2020-09-10 ENCOUNTER — Encounter: Payer: Self-pay | Admitting: Occupational Therapy

## 2020-09-10 ENCOUNTER — Ambulatory Visit: Payer: Medicare HMO

## 2020-09-10 ENCOUNTER — Other Ambulatory Visit: Payer: Self-pay

## 2020-09-10 DIAGNOSIS — R278 Other lack of coordination: Secondary | ICD-10-CM | POA: Diagnosis not present

## 2020-09-10 DIAGNOSIS — R262 Difficulty in walking, not elsewhere classified: Secondary | ICD-10-CM

## 2020-09-10 DIAGNOSIS — M6281 Muscle weakness (generalized): Secondary | ICD-10-CM

## 2020-09-10 DIAGNOSIS — R29818 Other symptoms and signs involving the nervous system: Secondary | ICD-10-CM | POA: Diagnosis not present

## 2020-09-10 DIAGNOSIS — R270 Ataxia, unspecified: Secondary | ICD-10-CM | POA: Diagnosis not present

## 2020-09-10 DIAGNOSIS — R2681 Unsteadiness on feet: Secondary | ICD-10-CM | POA: Diagnosis not present

## 2020-09-10 DIAGNOSIS — R2689 Other abnormalities of gait and mobility: Secondary | ICD-10-CM

## 2020-09-10 DIAGNOSIS — R29898 Other symptoms and signs involving the musculoskeletal system: Secondary | ICD-10-CM | POA: Diagnosis not present

## 2020-09-10 NOTE — Therapy (Addendum)
[facility] [address] [place], [place], [address] Phone: [phone]   Fax:  [phone]  Occupational Therapy Treatment  Patient Details  Name: Evan [name] MRN: [id number] Date of Birth: 1927-04-13 Referring Provider (OT): Dr. Jamse Arn   Encounter Date: 09/10/2020   OT End of Session - 09/10/20 1422     Visit Number 5    Number of Visits 17    Date for OT Re-Evaluation 10/24/20    Authorization Type [employer] [place] - Visit Number 5    Authorization - Number of Visits 10    Progress Note Due on Visit 10    OT Start Time [phone]    OT Stop Time 0932    OT Time Calculation (min) 38 min             Past Medical History:  Diagnosis Date   Adenocarcinoma of right lung ([place]) 01/06/2016   Anginal chest pain at rest [facility])    Chronic non, controlled on Lorazepam   Anxiety    Arthritis    "all over"    BPH (benign prostatic hyperplasia)    CAD (coronary artery disease)    Chronic lower back pain    Complication of anesthesia    "problems making water afterwards"   Depression    DJD (degenerative joint disease)    Esophageal ulcer without bleeding    bid ppi indefinitely   Family history of adverse reaction to anesthesia    "children w/PONV"   GERD (gastroesophageal reflux disease)    Headache    "couple/week maybe" (09/04/2015)   Hemochromatosis    Possible, elevated iron stores, hook-like osteophytes on hand films, normal LFTs   Hepatitis    "yellow jaundice as a baby"   History of ASCVD    MULTIVESSEL   Hyperlipidemia    Hypertension    Osteoarthritis    Pneumonia    "several times; got a little now" (09/04/2015)   Rheumatoid arthritis ([place])    Subdural hematoma ([place])    small after fall 09/2016-plavix held, neurosurgery consulted.  Asymptomatic.     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[place]) 02/2018   on Lovenox lifelong since he failed PO Eliquis    Past Surgical History:   Procedure Laterality Date   ARM SKIN LESION BIOPSY / EXCISION Left 09/02/2015   CATARACT EXTRACTION W/ INTRAOCULAR LENS  IMPLANT, BILATERAL Bilateral    CHOLECYSTECTOMY OPEN     CORNEAL TRANSPLANT Bilateral    "one at [facility]; one at [facility]"   [place] W/ STENT     DE stent ostium into the right radial free graft at OM-, 02-2007   ESOPHAGOGASTRODUODENOSCOPY N/A 11/09/2014   Procedure: ESOPHAGOGASTRODUODENOSCOPY (EGD);  Surgeon: [name], MD;  Location: [facility] ENDOSCOPY;  Service: Endoscopy;  Laterality: N/A;   INGUINAL HERNIA REPAIR     JOINT REPLACEMENT     LEFT HEART CATH AND CORS/GRAFTS ANGIOGRAPHY N/A 06/27/2017   Procedure: LEFT HEART CATH AND CORS/GRAFTS ANGIOGRAPHY;  Surgeon: [name], MD;  Location: [facility] CV LAB;  Service: Cardiovascular;  Laterality: N/A;   NASAL SINUS SURGERY     SHOULDER OPEN ROTATOR CUFF REPAIR Right    TOTAL KNEE ARTHROPLASTY Bilateral     There were no vitals filed for this visit.   Subjective Assessment - 09/10/20 1422     Subjective  Denies pain  Pertinent History hx of Thalamic hemorrhage.        PMH:  HOH, CAD, RA, Hx of R RTC repair, hx of bilateral knee arthroplasty, hx of CABG, hx of aortic andurysm repair, hx of corneal transplant. hx of chronic low back pain, depression, R lung adenocarcinoma, posssible radiation (not active) pneumonitis, A fib, hx of DVT/PE--on treatment dose Lovenox, SDH 09/2016, chronic thrombocytopenia    Patient Stated Goals incr RUE functional use    Currently in Pain? No/denies               Table slides for shoulder flex x15 with rest breaks after 5 reps, min-mod cueing to slow down, shoulder hike.  Cane exercise for shoulder abduction x 2 sets of 6 with min-mod tactile/verbal cues to slow down, extend elbows  AAROM shoulder flex/elbow ext with tilted cane x 2 sets of 6 with min-mod verbal/tactile  cues to slow down  Light wt. Bearing through hand with body on arm movements and then through elbow with body on arm movements with min-mod cueing./facilitation for normal movement patterns.    Picking up 1-inch blocks using 2-point pinch and placing in container with min-mod cues/min facilitation for slow/controlled movements and normal movement patterns.    Removing cylinder/wooden pegs from pegboard with min-mod cueing for slow/controlled movements.  Focused on positioning of RUE in neutral shoulder position at rest with min-mod cues/facilitation.         OT Education - 09/10/20 1423     Education Details Recommended pt relax RUE by side (supported at approx 80-90*) on arm rest and avoid IR/guarding position at rest    Person(s) Educated Patient;Child(ren)    Methods Explanation;Demonstration;Verbal cues;Tactile cues    Comprehension Verbalized understanding;Returned demonstration;Verbal cues required;Tactile cues required;Need further instruction              OT Short Term Goals - 08/25/20 1509       OT SHORT TERM GOAL #1   Title Pt/family will be independent with HEP for incr RUE functional use.--check STGs 09/24/20    Time 4    Period Weeks    Status New      OT SHORT TERM GOAL #2   Title Pt will improve LUE coordination and low level functional reaching as shown by ability to grasp/release cylinder object in at least 3/5 trials.    Time 4    Period Weeks    Status New      OT SHORT TERM GOAL #3   Title Pt will be able to pick up 1" object using pinch at least 2/5 trials for incr coordination for ADLs.    Time 4    Period Weeks    Status New      OT SHORT TERM GOAL #4   Title --      OT SHORT TERM GOAL #5   Title --               OT Long Term Goals - 08/25/20 1513       OT LONG TERM GOAL #1   Title Pt will incorporate RUE functionally in at least 3 additional ADL tasks consistently.    Baseline currently only using to push up/reach back for w/c  and lock w/c brake with cues    Time 8    Period Weeks    Status New      OT LONG TERM GOAL #2   Title Pt will improve RUE coordination as shown by improving score on box  and blocks test by at least 4.    Baseline 4 blocks.    Time 8    Period Weeks    Status New      OT LONG TERM GOAL #3   Title Pt will improve LUE coordination and functional reaching to midlevel as shown by ability to grasp/release cylinder object in at least 3/5 trials.    Time 8    Period Weeks    Status New      OT LONG TERM GOAL #4   Title Pt will be able to pick up 1" object using pinch and place in bowl at least 4/5 trials for incr coordination for ADLs.    Time 8    Period Weeks    Status New                   Plan - 09/10/20 1422     Clinical Impression Statement Pt is progressing towards goals . He continues to require mod v.c  to move slow and keep elbow down during functional activities.  He also needs frequent rest breaks.  However, t does demo improving LUE.    OT Occupational Profile and History Detailed Assessment- Review of Records and additional review of physical, cognitive, psychosocial history related to current functional performance    Occupational performance deficits (Please refer to evaluation for details): ADL's;IADL's    Body Structure / Function / Physical Skills ADL;Decreased knowledge of use of DME;Strength;UE functional use;ROM;FMC;GMC;Dexterity;Mobility;Coordination    Rehab Potential Fair    OT Frequency 2x / week    OT Duration 8 weeks    Plan RUE functional use,  emphasis on slow, controlled movements    OT Home Exercise Plan Pt has been doing shoulder flex with cane and theraband exercises (shoulder ext, scapular retraction, horizontal abduction) per dtr--instructed to hold on theraband exercises due to decr control RUE    Consulted and Agree with Plan of Care Patient;Family member/caregiver    Family Member Consulted daughter,             Patient will  benefit from skilled therapeutic intervention in order to improve the following deficits and impairments:   Body Structure / Function / Physical Skills: ADL, Decreased knowledge of use of DME, Strength, UE functional use, ROM, FMC, GMC, Dexterity, Mobility, Coordination       Visit Diagnosis: Other lack of coordination  Muscle weakness (generalized)  Ataxia  Other symptoms and signs involving the musculoskeletal system  Other symptoms and signs involving the nervous system    Problem List Patient Active Problem List   Diagnosis Date Noted   Rib pain on right side 03/19/2020   Sundowning 07/04/2019   Bilateral hearing loss 04/16/2019   Urinary frequency 03/21/2019   Chronic right shoulder pain 12/19/2018   Weakness generalized 09/28/2018   Palliative care encounter 09/28/2018   Pressure injury of skin 08/27/2018   COPD (chronic obstructive pulmonary disease) ([place]) 08/26/2018   AMS (altered mental status) 08/22/2018   Altered mental status 08/21/2018   UTI (urinary tract infection) 08/21/2018   Abnormality of gait 08/20/2018   Prolonged Q-T interval on ECG    Dysphagia, post-stroke    Supplemental oxygen dependent    Anxiety state    Fatigue    SOB (shortness of breath)    Hyperglycemia    History of lung cancer    Depression    CKD (chronic kidney disease), stage III (HCC)    Acute blood loss anemia  Fall 07/16/2018   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) 06/26/2017   Dyspnea 06/21/2017   Acute bronchitis 05/15/2017   Oral candidiasis 05/15/2017   Acute pericarditis    Chest pain at rest 12/20/2016   Angina pectoris ([place]) 12/20/2016   Radiation pneumonitis ([place]) 11/09/2016   Pleurisy 10/11/2016   Chest wall pain 10/11/2016   Atrial fibrillation ([place]) 05/21/2016   Adenocarcinoma of right lung ([place]) 01/06/2016   GAD (generalized anxiety disorder) 11/20/2015   Essential hypertension    Cellulitis 09/04/2015   Cellulitis of left axilla    Esophageal ulcer without bleeding    Bilateral lower extremity edema 04/28/2014   BPH (benign prostatic hyperplasia) 10/08/2012   Anxiety    EKG abnormalities 09/05/2012   Tremor 09/05/2012   Chronic fatigue 09/05/2012   Arthritis    Asthma, chronic    Reflux esophagitis    S/P CABG (coronary artery bypass graft)    GERD (gastroesophageal reflux disease)    Hyperlipidemia    Hypertension    UNSPECIFIED PERIPHERAL VASCULAR DISEASE 06/16/2009    [facility] 09/10/2020, 2:24 PM  [facility] [address] [place], [place], [address] Phone: [phone]   Fax:  [phone]  Name: Evan [name] MRN: [id number] Date of Birth: Jun 22, 1927  Vianne Bulls, OTR/L [facility] [address] [place], [place]  [address] [phone] phone [phone] 09/10/20 2:24 PM

## 2020-09-10 NOTE — Therapy (Signed)
[facility] [address] [place], [place], [address] Phone: [phone]   Fax:  [phone]  Physical Therapy Treatment  Patient Details  Name: Evan [name] MRN: [id number] Date of Birth: June 08, 1927 Referring Provider (PT): Delice Lesch, MD   Encounter Date: 09/10/2020   PT End of Session - 09/10/20 0931     Visit Number 4    Number of Visits 13    Date for PT Re-Evaluation 10/24/20   POC for 6 weeks, Cert for 60 days   Authorization Type [employer] (10th Visit PN)    Authorization Time Period Awaiting Authorization Approval    PT Start Time 0935   finishing up with OT   PT Stop Time 1014    PT Time Calculation (min) 39 min    Equipment Utilized During Treatment Gait belt    Activity Tolerance Patient tolerated treatment well;Patient limited by fatigue    Behavior During Therapy [facility] for tasks assessed/performed             Past Medical History:  Diagnosis Date   Adenocarcinoma of right lung ([place]) 01/06/2016   Anginal chest pain at rest [facility])    Chronic non, controlled on Lorazepam   Anxiety    Arthritis    "all over"    BPH (benign prostatic hyperplasia)    CAD (coronary artery disease)    Chronic lower back pain    Complication of anesthesia    "problems making water afterwards"   Depression    DJD (degenerative joint disease)    Esophageal ulcer without bleeding    bid ppi indefinitely   Family history of adverse reaction to anesthesia    "children w/PONV"   GERD (gastroesophageal reflux disease)    Headache    "couple/week maybe" (09/04/2015)   Hemochromatosis    Possible, elevated iron stores, hook-like osteophytes on hand films, normal LFTs   Hepatitis    "yellow jaundice as a baby"   History of ASCVD    MULTIVESSEL   Hyperlipidemia    Hypertension    Osteoarthritis    Pneumonia    "several times; got a little now" (09/04/2015)   Rheumatoid arthritis ([place])    Subdural hematoma ([place])     small after fall 09/2016-plavix held, neurosurgery consulted.  Asymptomatic.     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[place]) 02/2018   on Lovenox lifelong since he failed PO Eliquis    Past Surgical History:  Procedure Laterality Date   ARM SKIN LESION BIOPSY / EXCISION Left 09/02/2015   CATARACT EXTRACTION W/ INTRAOCULAR LENS  IMPLANT, BILATERAL Bilateral    CHOLECYSTECTOMY OPEN     CORNEAL TRANSPLANT Bilateral    "one at [facility]; one at [facility]"   [place] W/ STENT     DE stent ostium into the right radial free graft at OM-, 02-2007   ESOPHAGOGASTRODUODENOSCOPY N/A 11/09/2014   Procedure: ESOPHAGOGASTRODUODENOSCOPY (EGD);  Surgeon: [name], MD;  Location: [facility] ENDOSCOPY;  Service: Endoscopy;  Laterality: N/A;   INGUINAL HERNIA REPAIR     JOINT REPLACEMENT     LEFT HEART CATH AND CORS/GRAFTS ANGIOGRAPHY N/A 06/27/2017   Procedure: LEFT HEART CATH AND CORS/GRAFTS ANGIOGRAPHY;  Surgeon: [name], MD;  Location: [facility] CV LAB;  Service: Cardiovascular;  Laterality: N/A;   NASAL SINUS SURGERY     SHOULDER OPEN ROTATOR CUFF REPAIR Right    TOTAL KNEE  ARTHROPLASTY Bilateral     There were no vitals filed for this visit.   Subjective Assessment - 09/10/20 0936     Subjective Patient started medication for bladder infection. Patient reports no pain or falls. Reports exercises are going well.    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 No/denies               [facility] Adult PT Treatment/Exercise - 09/10/20 0001       Transfers   Transfers Sit to Stand;Stand to  Sit;Stand Pivot Transfers    Sit to Stand 4: Min guard    Sit to Stand Details Verbal cues for technique    Stand to Sit 4: Min guard    Comments completed sit <> stands from w/c, with emphasis on slowed controlled movements and to promote improved forward lean (Nose over toes), complete x 5 reps, with 5-10 second stand without UE support to further challenge balance. Close CGA required throughout. Intermittent Cues for upright posture with completion.      Ambulation/Gait   Ambulation/Gait Yes    Ambulation/Gait Assistance 4: Min guard;4: Min assist    Ambulation/Gait Assistance Details Continued gait training with RW. Trialed R Hand grip attachment to proimote weight bearing and improved positioning of RUE with ambulation, patient tolerating well. Ambulating x 21, x 24 ft, and x 32ft with intermittent rest breaks required due to reports of discomfort in low back.    Ambulation Distance (Feet) 21 Feet   x1, 24 x 1, 45 x 1   Assistive device Rolling walker   with AFO and R Hand Grip Attachment   Gait Pattern Step-to pattern;Step-through pattern;Decreased step length - right;Decreased step length - left;Narrow base of support    Ambulation Surface Level;Indoor      Neuro Re-ed    Neuro Re-ed Details  Standing in // bars with UE support: completed steps forward to yellow floor dot,  x 5 reps to further promote improved weight shift onto RLE. Use of mirror for visual feedback. Cues and faciliation for improved weight shift and upright posture. Intermittent rest breaks required due to fatigue.                PT Short Term Goals - 08/25/20 1328       PT SHORT TERM GOAL #1   Title Patient will be independent with initial HEP with family member assistance to improve functional outcomes (All STGs Due: 09/15/20)    Baseline HEP from prior POC    Time 3    Period Weeks    Status New    Target Date 09/15/20      PT SHORT TERM GOAL #2   Title Patient will be able to perform 5x sit <> stand  in < 45 seconds to demonstrate improvement in functional mobility and reduced risk for falls.    Baseline unable to complete, 3 reps in 22.37 secs    Time 3    Period Weeks    Status New      PT SHORT TERM GOAL #3   Title Patient will be able to ambulate for >/= 50 ft with LBQC vs RW for improved household mobility  Baseline 20 ft    Time 3    Period Weeks    Status New      PT SHORT TERM GOAL #4   Title Patient will be able to stand without UE support and close supervision for >/= 1 minute for improved tolerance for ADLs    Baseline 20-30 secs, CGA    Time 3    Period Weeks    Status New               PT Long Term Goals - 08/25/20 1334       PT LONG TERM GOAL #1   Title Patient will be independent with final HEP for strength/balance with family member assistance (All LTGs Due: 10/06/20)    Baseline HEP from prior POC    Time 6    Period Weeks    Status New    Target Date 10/06/20      PT LONG TERM GOAL #2   Title Patient will be able to complete 5x sit <> stand in </= 30 seconds with CGA to demo improved balance and functional mobility    Baseline unable to complete    Time 6    Period Weeks    Status New      PT LONG TERM GOAL #3   Title Patient will demo ability to complete transfers from w/c <> mat with LRAD and supervision    Baseline CGA - Min A    Time 6    Period Weeks    Status New      PT LONG TERM GOAL #4   Title Patient will be able to stand >/= 2 minutes without UE support to improve tolerance for ADLs    Baseline < 30 seconds    Time 6    Period Weeks    Status New      PT LONG TERM GOAL #5   Title Patient will be able to ambulate for >/= 100 ft with LBQC vs RW for improved household mobility    Baseline 20 ft    Time 6    Period Weeks    Status New                   Plan - 09/10/20 1245     Clinical Impression Statement Today's skilled PT session focused on continued gait training with RW, utilizing R Hand grip to promote  improved weight bearing and weight shift. Patient demonstrating improved ambulation distance today. Rest of session focused on continued functional mobility and R weight shift activities. Will continue to progress toward all STG/LTGs.    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, lumbar fracture December 2020    Examination-Activity Limitations Bed Mobility;Locomotion Level;Sit;Stand;Transfers    Examination-Participation Restrictions Medication Management;Community Activity;Interpersonal Relationship    Stability/Clinical Decision Making Evolving/Moderate complexity    Rehab Potential Fair    PT Frequency 2x / week    PT Duration 6 weeks    PT Treatment/Interventions ADLs/Self Care Home Management;Cryotherapy;Moist Heat;DME Instruction;Gait training;Stair training;Functional mobility training;Therapeutic activities;Therapeutic exercise;Balance training;Neuromuscular re-education;Patient/family education;Orthotic Fit/Training;Manual techniques;Wheelchair mobility training;Passive range of motion;Visual/perceptual remediation/compensation    PT Next Visit Plan Check STGs. Continue gait training with SBQC and with RW (may want to try hand grip attachment) to tro to get right arm out of flexed posture  and get some weight bearing through it to help with weight shift.  Work on improving standing tolerance.    Consulted and Agree with Plan of Care Patient;Family member/caregiver    Family Member Consulted Daughter/Son             Patient will benefit from skilled therapeutic intervention in order to improve the following deficits and impairments:  Abnormal gait, Decreased coordination, Decreased range of motion, Difficulty walking, Impaired tone, Pain, Impaired UE  functional use, Decreased safety awareness, Decreased endurance, Decreased activity tolerance, Decreased knowledge of use of DME, Decreased balance, Decreased cognition, Decreased strength, Impaired sensation  Visit Diagnosis: Muscle weakness (generalized)  Unsteadiness on feet  Difficulty in walking, not elsewhere classified  Other abnormalities of gait and mobility     Problem List Patient Active Problem List   Diagnosis Date Noted   Rib pain on right side 03/19/2020   Sundowning 07/04/2019   Bilateral hearing loss 04/16/2019   Urinary frequency 03/21/2019   Chronic right shoulder pain 12/19/2018   Weakness generalized 09/28/2018   Palliative care encounter 09/28/2018   Pressure injury of skin 08/27/2018   COPD (chronic obstructive pulmonary disease) ([place]) 08/26/2018   AMS (altered mental status) 08/22/2018   Altered mental status 08/21/2018   UTI (urinary tract infection) 08/21/2018   Abnormality of gait 08/20/2018   Prolonged Q-T interval on ECG    Dysphagia, post-stroke    Supplemental oxygen dependent    Anxiety state    Fatigue    SOB (shortness of breath)    Hyperglycemia    History of lung cancer    Depression    CKD (chronic kidney disease), stage III ([place])    Acute blood loss anemia    Fall 07/16/2018   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   Acute spontaneous 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 ([place]) L thalamic 07/14/2018   Cellulitis of arm, right 06/01/2018   Rash and nonspecific skin eruption 06/01/2018   Chest pain 02/02/2018   Thrombocytopenia ([place]) 02/02/2018   Hypokalemia 02/02/2018   Pulmonary embolism ([place]) 02/02/2018   Elevated troponin 02/02/2018   Pulmonary embolus ([place]) 02/02/2018   Non-ST elevation MI (NSTEMI) ([place]) 33/29/5188   Diastolic CHF ([place]) 41/66/0630   Chest pain due to CAD ([place]) 06/26/2017   Dyspnea 06/21/2017   Acute bronchitis 05/15/2017   Oral  candidiasis 05/15/2017   Acute pericarditis    Chest pain at rest 12/20/2016   Angina pectoris ([place]) 12/20/2016   Radiation pneumonitis ([place]) 11/09/2016   Pleurisy 10/11/2016   Chest wall pain 10/11/2016   Atrial fibrillation ([place]) 05/21/2016   Adenocarcinoma of right lung ([place]) 01/06/2016   GAD (generalized anxiety disorder) 11/20/2015   Essential hypertension    Cellulitis 09/04/2015   Cellulitis of left axilla    Esophageal ulcer without bleeding    Bilateral lower extremity edema 04/28/2014   BPH (benign prostatic hyperplasia) 10/08/2012   Anxiety    EKG abnormalities 09/05/2012   Tremor 09/05/2012   Chronic fatigue 09/05/2012   Arthritis    Asthma, chronic    Reflux esophagitis    S/P CABG (coronary artery bypass graft)    GERD (gastroesophageal reflux disease)    Hyperlipidemia    Hypertension    UNSPECIFIED PERIPHERAL VASCULAR DISEASE 06/16/2009    Jones Bales, PT, DPT 09/10/2020, 12:56 PM  [facility] [address] [place], [place], [address] Phone: [phone]   Fax:  [phone]  Name: Evan Reichmann  KEYLAN [name] MRN: [id number] Date of Birth: November 09, 1927

## 2020-09-14 ENCOUNTER — Ambulatory Visit: Payer: Medicare HMO

## 2020-09-14 ENCOUNTER — Ambulatory Visit: Payer: Medicare HMO | Admitting: Occupational Therapy

## 2020-09-14 ENCOUNTER — Other Ambulatory Visit (HOSPITAL_COMMUNITY): Payer: Self-pay

## 2020-09-14 ENCOUNTER — Other Ambulatory Visit: Payer: Self-pay | Admitting: Family Medicine

## 2020-09-14 MED ORDER — HYDROXYZINE HCL 10 MG PO TABS
10.0000 mg | ORAL_TABLET | Freq: Every day | ORAL | 0 refills | Status: DC
  Filled 2020-09-14: qty 30, 30d supply, fill #0

## 2020-09-18 DIAGNOSIS — R3915 Urgency of urination: Secondary | ICD-10-CM | POA: Diagnosis not present

## 2020-09-23 ENCOUNTER — Other Ambulatory Visit: Payer: Self-pay

## 2020-09-23 ENCOUNTER — Ambulatory Visit: Payer: Medicare HMO

## 2020-09-23 ENCOUNTER — Encounter: Payer: Self-pay | Admitting: Occupational Therapy

## 2020-09-23 ENCOUNTER — Ambulatory Visit: Payer: Medicare HMO | Admitting: Occupational Therapy

## 2020-09-23 DIAGNOSIS — R2681 Unsteadiness on feet: Secondary | ICD-10-CM

## 2020-09-23 DIAGNOSIS — M6281 Muscle weakness (generalized): Secondary | ICD-10-CM

## 2020-09-23 DIAGNOSIS — R278 Other lack of coordination: Secondary | ICD-10-CM | POA: Diagnosis not present

## 2020-09-23 DIAGNOSIS — R2689 Other abnormalities of gait and mobility: Secondary | ICD-10-CM

## 2020-09-23 DIAGNOSIS — R270 Ataxia, unspecified: Secondary | ICD-10-CM

## 2020-09-23 DIAGNOSIS — R29818 Other symptoms and signs involving the nervous system: Secondary | ICD-10-CM | POA: Diagnosis not present

## 2020-09-23 DIAGNOSIS — R29898 Other symptoms and signs involving the musculoskeletal system: Secondary | ICD-10-CM

## 2020-09-23 DIAGNOSIS — R262 Difficulty in walking, not elsewhere classified: Secondary | ICD-10-CM

## 2020-09-23 NOTE — Therapy (Signed)
[facility] [address] [place], [place], [address] Phone: [phone]   Fax:  [phone]  Occupational Therapy Treatment  Patient Details  Name: Evan [name] MRN: [id number] Date of Birth: 1927/08/18 Referring Provider (OT): Dr. Jamse Arn   Encounter Date: 09/23/2020   OT End of Session - 09/23/20 0921     Visit Number 6    Number of Visits 17    Date for OT Re-Evaluation 10/24/20    Authorization Type [employer] [place] - Visit Number 6    Authorization - Number of Visits 10    Progress Note Due on Visit 10    OT Start Time [phone]    OT Stop Time 0930    OT Time Calculation (min) 40 min    Activity Tolerance Patient tolerated treatment well    Behavior During Therapy [facility] for tasks assessed/performed             Past Medical History:  Diagnosis Date   Adenocarcinoma of right lung ([place]) 01/06/2016   Anginal chest pain at rest [facility])    Chronic non, controlled on Lorazepam   Anxiety    Arthritis    "all over"    BPH (benign prostatic hyperplasia)    CAD (coronary artery disease)    Chronic lower back pain    Complication of anesthesia    "problems making water afterwards"   Depression    DJD (degenerative joint disease)    Esophageal ulcer without bleeding    bid ppi indefinitely   Family history of adverse reaction to anesthesia    "children w/PONV"   GERD (gastroesophageal reflux disease)    Headache    "couple/week maybe" (09/04/2015)   Hemochromatosis    Possible, elevated iron stores, hook-like osteophytes on hand films, normal LFTs   Hepatitis    "yellow jaundice as a baby"   History of ASCVD    MULTIVESSEL   Hyperlipidemia    Hypertension    Osteoarthritis    Pneumonia    "several times; got a little now" (09/04/2015)   Rheumatoid arthritis ([place])    Subdural hematoma ([place])    small after fall 09/2016-plavix held, neurosurgery consulted.  Asymptomatic.      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[place]) 02/2018   on Lovenox lifelong since he failed [name] Eliquis    Past Surgical History:  Procedure Laterality Date   ARM SKIN LESION BIOPSY / EXCISION Left 09/02/2015   CATARACT EXTRACTION W/ INTRAOCULAR LENS  IMPLANT, BILATERAL Bilateral    CHOLECYSTECTOMY OPEN     CORNEAL TRANSPLANT Bilateral    "one at [facility]; one at [facility]"   [place] W/ STENT     DE stent ostium into the right radial free graft at [facility]-, 02-2007   ESOPHAGOGASTRODUODENOSCOPY N/A 11/09/2014   Procedure: ESOPHAGOGASTRODUODENOSCOPY (EGD);  Surgeon: [name], MD;  Location: [facility] ENDOSCOPY;  Service: Endoscopy;  Laterality: N/A;   INGUINAL HERNIA REPAIR     JOINT REPLACEMENT     LEFT HEART CATH AND CORS/GRAFTS ANGIOGRAPHY N/A 06/27/2017   Procedure: LEFT HEART CATH AND CORS/GRAFTS ANGIOGRAPHY;  Surgeon: [name], MD;  Location: [facility] CV LAB;  Service: Cardiovascular;  Laterality: N/A;   NASAL SINUS SURGERY     SHOULDER OPEN ROTATOR CUFF REPAIR Right    TOTAL KNEE ARTHROPLASTY Bilateral     There were no vitals  filed for this visit.   Subjective Assessment - 09/23/20 0853     Subjective  Denies pain    Pertinent History hx of Thalamic hemorrhage.        PMH:  HOH, CAD, RA, Hx of R RTC repair, hx of bilateral knee arthroplasty, hx of CABG, hx of aortic andurysm repair, hx of corneal transplant. hx of chronic low back pain, depression, R lung adenocarcinoma, posssible radiation (not active) pneumonitis, A fib, hx of DVT/PE--on treatment dose Lovenox, SDH 09/2016, chronic thrombocytopenia    Patient Stated Goals incr RUE functional use    Currently in Pain? No/denies                           Treatment: Table slides for shoulder flex x15 with rest breaks after 5 reps, min-mod cueing to slow down, shoulder hike.  Cane exercise for shoulder abduction x 2 sets of5  with  min-mod tactile/verbal cues to slow down,    Removing cylinder/wooden pegs from pegboard with min-mod cueing for performance  Standing with walker with hand splint for RUE for weight bearing and standing tolerance, small weightshifts,  pt fatigued quickly, min A  Picking up 1-inch blocks and placing in container with min-mod cues/min facilitation for controlled movements   Pt demonstrates improved RUE control.         OT Short Term Goals - 08/25/20 1509       OT SHORT TERM GOAL #1   Title Pt/family will be independent with HEP for incr RUE functional use.--check STGs 09/24/20    Time 4    Period Weeks    Status New      OT SHORT TERM GOAL #2   Title Pt will improve LUE coordination and low level functional reaching as shown by ability to grasp/release cylinder object in at least 3/5 trials.    Time 4    Period Weeks    Status New      OT SHORT TERM GOAL #3   Title Pt will be able to pick up 1" object using pinch at least 2/5 trials for incr coordination for ADLs.    Time 4    Period Weeks    Status New      OT SHORT TERM GOAL #4   Title --      OT SHORT TERM GOAL #5   Title --               OT Long Term Goals - 08/25/20 1513       OT LONG TERM GOAL #1   Title Pt will incorporate RUE functionally in at least 3 additional ADL tasks consistently.    Baseline currently only using to push up/reach back for w/c and lock w/c brake with cues    Time 8    Period Weeks    Status New      OT LONG TERM GOAL #2   Title Pt will improve RUE coordination as shown by improving score on box and blocks test by at least 4.    Baseline 4 blocks.    Time 8    Period Weeks    Status New      OT LONG TERM GOAL #3   Title Pt will improve LUE coordination and functional reaching to midlevel as shown by ability to grasp/release cylinder object in at least 3/5 trials.    Time 8    Period Weeks    Status New  OT LONG TERM GOAL #4   Title Pt will be able to pick up 1"  object using pinch and place in bowl at least 4/5 trials for incr coordination for ADLs.    Time 8    Period Weeks    Status New                   Plan - 09/23/20 2595     Clinical Impression Statement Pt is progressing towards goals . Pt had UTI last week and his dtr feels as though it set him back a little.Marland Kitchen  He also needs frequent rest breaks.  However,pt does demo improving LUE use    OT Occupational Profile and History Detailed Assessment- Review of Records and additional review of physical, cognitive, psychosocial history related to current functional performance    Occupational performance deficits (Please refer to evaluation for details): ADL's;IADL's    Body Structure / Function / Physical Skills ADL;Decreased knowledge of use of DME;Strength;UE functional use;ROM;FMC;GMC;Dexterity;Mobility;Coordination    Rehab Potential Fair    Modification or Assistance to Complete Evaluation  Min-Moderate modification of tasks or assist with assess necessary to complete eval    OT Frequency 2x / week    OT Duration 8 weeks    OT Treatment/Interventions Moist Heat;Fluidtherapy;Self-care/ADL training;DME and/or AE instruction;Splinting;Therapeutic activities;Therapeutic exercise;Neuromuscular education;Functional Mobility Training;Passive range of motion;Patient/family education;Manual Therapy    Plan RUE functional use,  emphasis on slow, controlled movements    OT Home Exercise Plan Pt has been doing shoulder flex with cane and theraband exercises (shoulder ext, scapular retraction, horizontal abduction) per dtr--instructed to hold on theraband exercises due to decr control RUE    Family Member Consulted daughter,             Patient will benefit from skilled therapeutic intervention in order to improve the following deficits and impairments:   Body Structure / Function / Physical Skills: ADL, Decreased knowledge of use of DME, Strength, UE functional use, ROM, FMC, GMC, Dexterity,  Mobility, Coordination       Visit Diagnosis: Muscle weakness (generalized)  Other lack of coordination  Ataxia  Other symptoms and signs involving the musculoskeletal system  Other symptoms and signs involving the nervous system    Problem List Patient Active Problem List   Diagnosis Date Noted   Rib pain on right side 03/19/2020   Sundowning 07/04/2019   Bilateral hearing loss 04/16/2019   Urinary frequency 03/21/2019   Chronic right shoulder pain 12/19/2018   Weakness generalized 09/28/2018   Palliative care encounter 09/28/2018   Pressure injury of skin 08/27/2018   COPD (chronic obstructive pulmonary disease) ([place]) 08/26/2018   AMS (altered mental status) 08/22/2018   Altered mental status 08/21/2018   UTI (urinary tract infection) 08/21/2018   Abnormality of gait 08/20/2018   Prolonged Q-T interval on ECG    Dysphagia, post-stroke    Supplemental oxygen dependent    Anxiety state    Fatigue    SOB (shortness of breath)    Hyperglycemia    History of lung cancer    Depression    CKD (chronic kidney disease), stage III (HCC)    Acute blood loss anemia    Fall 07/16/2018   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   Acute spontaneous 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 ([place]) L thalamic 07/14/2018   Cellulitis of arm, right 06/01/2018   Rash and nonspecific skin eruption 06/01/2018   Chest pain 02/02/2018   Thrombocytopenia ([place])  02/02/2018   Hypokalemia 02/02/2018   Pulmonary embolism ([place]) 02/02/2018   Elevated troponin 02/02/2018   Pulmonary embolus ([place]) 02/02/2018   Non-ST elevation MI (NSTEMI) ([place]) 35/68/6168   Diastolic CHF ([place]) 37/29/0211   Chest pain due to CAD ([place]) 06/26/2017   Dyspnea 06/21/2017   Acute bronchitis 05/15/2017   Oral candidiasis 05/15/2017   Acute pericarditis    Chest pain at rest 12/20/2016   Angina pectoris ([place]) 12/20/2016   Radiation pneumonitis  ([place]) 11/09/2016   Pleurisy 10/11/2016   Chest wall pain 10/11/2016   Atrial fibrillation ([place]) 05/21/2016   Adenocarcinoma of right lung ([place]) 01/06/2016   GAD (generalized anxiety disorder) 11/20/2015   Essential hypertension    Cellulitis 09/04/2015   Cellulitis of left axilla    Esophageal ulcer without bleeding    Bilateral lower extremity edema 04/28/2014   BPH (benign prostatic hyperplasia) 10/08/2012   Anxiety    EKG abnormalities 09/05/2012   Tremor 09/05/2012   Chronic fatigue 09/05/2012   Arthritis    Asthma, chronic    Reflux esophagitis    S/P CABG (coronary artery bypass graft)    GERD (gastroesophageal reflux disease)    Hyperlipidemia    Hypertension    UNSPECIFIED PERIPHERAL VASCULAR DISEASE 06/16/2009    Jakita Dutkiewicz 09/23/2020, 9:23 AM Theone Murdoch, OTR/L [phone] Phone: [phone] 3:51 PM 09/23/20  [facility] [address] [place], [place], [address] Phone: [phone]   Fax:  [phone]  Name: Evan [name] MRN: [id number] Date of Birth: 20-May-1927

## 2020-09-23 NOTE — Patient Instructions (Signed)
Access Code: [email or web address] URL: [email or web address] Date: 09/23/2020 Prepared by: Baldomero Lamy  Exercises Supine Bridge - 1 x daily - 5 x weekly - 2 sets - 5 reps Seated Long Arc Quad with Ankle Weight - 2 x daily - 5 x weekly - 1 sets - 10 reps Seated March - 2 x daily - 5 x weekly - 1 sets - 10 reps Sit to Stand - 2 x daily - 5 x weekly - 1 sets - 5 reps Seated Hamstring Curl with Anchored Resistance - 2 x daily - 5 x weekly - 1 sets - 10 reps Bent Knee Fallouts with Alternating Legs - 1 x daily - 5 x weekly - 1 sets - 10 reps

## 2020-09-23 NOTE — Therapy (Signed)
[facility] [address] [place], [place], [address] Phone: [phone]   Fax:  [phone]  Physical Therapy Treatment  Patient Details  Name: Evan [name] MRN: [id number] Date of Birth: 06-13-1927 Referring Provider (PT): Delice Lesch, MD   Encounter Date: 09/23/2020   PT End of Session - 09/23/20 0931     Visit Number 5    Number of Visits 13    Date for PT Re-Evaluation 10/24/20   POC for 6 weeks, Cert for 60 days   Authorization Type [employer] (10th Visit PN)    Authorization Time Period Awaiting Authorization Approval    PT Start Time 0930    PT Stop Time 1014    PT Time Calculation (min) 44 min    Equipment Utilized During Treatment Gait belt    Activity Tolerance Patient tolerated treatment well;Patient limited by fatigue    Behavior During Therapy [facility] for tasks assessed/performed             Past Medical History:  Diagnosis Date   Adenocarcinoma of right lung ([place]) 01/06/2016   Anginal chest pain at rest [facility])    Chronic non, controlled on Lorazepam   Anxiety    Arthritis    "all over"    BPH (benign prostatic hyperplasia)    CAD (coronary artery disease)    Chronic lower back pain    Complication of anesthesia    "problems making water afterwards"   Depression    DJD (degenerative joint disease)    Esophageal ulcer without bleeding    bid ppi indefinitely   Family history of adverse reaction to anesthesia    "children w/PONV"   GERD (gastroesophageal reflux disease)    Headache    "couple/week maybe" (09/04/2015)   Hemochromatosis    Possible, elevated iron stores, hook-like osteophytes on hand films, normal LFTs   Hepatitis    "yellow jaundice as a baby"   History of ASCVD    MULTIVESSEL   Hyperlipidemia    Hypertension    Osteoarthritis    Pneumonia    "several times; got a little now" (09/04/2015)   Rheumatoid arthritis ([place])    Subdural hematoma ([place])    small after fall  09/2016-plavix held, neurosurgery consulted.  Asymptomatic.     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[place]) 02/2018   on Lovenox lifelong since he failed PO Eliquis    Past Surgical History:  Procedure Laterality Date   ARM SKIN LESION BIOPSY / EXCISION Left 09/02/2015   CATARACT EXTRACTION W/ INTRAOCULAR LENS  IMPLANT, BILATERAL Bilateral    CHOLECYSTECTOMY OPEN     CORNEAL TRANSPLANT Bilateral    "one at [facility]; one at [facility]"   [place] W/ STENT     DE stent ostium into the right radial free graft at [facility]-, 02-2007   ESOPHAGOGASTRODUODENOSCOPY N/A 11/09/2014   Procedure: ESOPHAGOGASTRODUODENOSCOPY (EGD);  Surgeon: [name], MD;  Location: [facility] ENDOSCOPY;  Service: Endoscopy;  Laterality: N/A;   INGUINAL HERNIA REPAIR     JOINT REPLACEMENT     LEFT HEART CATH AND CORS/GRAFTS ANGIOGRAPHY N/A 06/27/2017   Procedure: LEFT HEART CATH AND CORS/GRAFTS ANGIOGRAPHY;  Surgeon: [name], MD;  Location: [facility];  Service: Cardiovascular;  Laterality: N/A;   NASAL SINUS SURGERY     SHOULDER OPEN ROTATOR CUFF REPAIR Right    TOTAL KNEE ARTHROPLASTY Bilateral  There were no vitals filed for this visit.   Subjective Assessment - 09/23/20 0931     Subjective Patient reports feeling fatigued, had UTI last week and missed appointment. No falls to report.    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 No/denies              [facility] Adult PT Treatment/Exercise - 09/23/20 0001       Bed Mobility   Bed Mobility Supine to Sit;Sit to Supine    Supine to Sit Contact  Guard/Touching assist    Sit to Supine Minimal Assistance - Patient > 75%   assistance with RLE     Transfers   Transfers Sit to Stand;Stand to Sit;Stand Pivot Transfers    Sit to Stand 4: Min guard    Sit to Stand Details Verbal cues for technique    Five time sit to stand comments  22.53 secs with BUE support and RW placed in front, CGA    Stand to Sit 4: Min guard    Stand Pivot Transfers 4: Min guard    Stand Pivot Transfer Details (indicate cue type and reason) completed from w/c <> mat without device and CGA from PT. cues for foot placement    Comments completed from w/c      Ambulation/Gait   Ambulation/Gait Yes    Ambulation/Gait Assistance 4: Min guard;4: Min assist    Ambulation/Gait Assistance Details Continued gait with RW and R Hand Grip attachment. Completed ambulation 2 x 50' with RW, PT providing CGA and intermittent Min A at times to promote improved placement on RLE. With increased fatigue, patient continue to dmeo decreased control and difficulty with foot placement. Cues for posture and foot position provided.    Ambulation Distance (Feet) 50 Feet   x 2   Assistive device Rolling walker   with AFO and R Hand Grip Attachment   Gait Pattern Step-to pattern;Step-through pattern;Decreased step length - right;Decreased step length - left;Narrow base of support    Ambulation Surface Level;Indoor    Pre-Gait Activities Completed standing balance at RW but not utilizing and CGA from PT for 43 secs. Patient requesting to sit after completion due to fatigue.      Exercises   Exercises Other Exercises    Other Exercises  Completed entire review of HEP. See [facility] Program for Details.            Completed review of HEP today during session, completed all of the following exercises. Tactile/verbal cues required for proper completion. Bolded are new additions. PT educating daughter to complete seated march and LAQ with red theraband as has not been completing this at home.    Access Code: [email or web address] URL: [email or web address] Date: 09/23/2020 Prepared by: Baldomero Lamy  Exercises Supine Bridge - 1 x daily - 5 x weekly - 2 sets - 5 reps Seated Long Arc Quad with Ankle Weight - 2 x daily - 5 x weekly - 1 sets - 10 reps Seated March - 2 x daily - 5 x weekly - 1 sets - 10 reps Sit to Stand - 2 x daily - 5 x weekly - 1 sets - 5 reps Seated Hamstring Curl with Anchored  Resistance - 2 x daily - 5 x weekly - 1 sets - 10 reps Bent Knee Fallouts with Alternating Legs - 1 x daily - 5 x weekly - 1 sets - 10 reps       PT Education - 09/23/20 0932     Education Details progress toward STGs; HEP review    Person(s) Educated Patient;Child(ren)    Methods Explanation;Demonstration;Handout    Comprehension Verbalized understanding;Returned demonstration;Verbal cues required;Tactile cues required              PT Short Term Goals - 09/23/20 0932       PT SHORT TERM GOAL #1   Title Patient will be independent with initial HEP with family member assistance to improve functional outcomes (All STGs Due: 09/15/20)    Baseline reports independence with assistance, progressed today during session    Time 3    Period Weeks    Status Achieved    Target Date 09/15/20      PT SHORT TERM GOAL #2   Title Patient will be able to perform 5x sit <> stand in < 45 seconds to demonstrate improvement in functional mobility and reduced risk for falls.    Baseline unable to complete, 3 reps in 22.37 secs; 22.53 secs    Time 3    Period Weeks    Status Achieved      PT SHORT TERM GOAL #3   Title Patient will be able to ambulate for >/= 50 ft with LBQC vs RW for improved household mobility    Baseline 20 ft, 50 ft with RW and CGA/Min-A    Time 3    Period Weeks    Status Achieved      PT SHORT TERM GOAL #4   Title Patient will be able to stand without UE support and close supervision for >/= 1 minute for improved tolerance for ADLs    Baseline 20-30 secs,  CGA; able to stand 43.69 secs still require CGA    Time 3    Period Weeks    Status Not Met               PT Long Term Goals - 08/25/20 1334       PT LONG TERM GOAL #1   Title Patient will be independent with final HEP for strength/balance with family member assistance (All LTGs Due: 10/06/20)    Baseline HEP from prior POC    Time 6    Period Weeks    Status New    Target Date 10/06/20      PT LONG TERM GOAL #2   Title Patient will be able to complete 5x sit <> stand in </= 30 seconds with CGA to demo improved balance and functional mobility    Baseline unable to complete    Time 6    Period Weeks    Status New      PT LONG TERM GOAL #3   Title Patient will demo ability to complete transfers from w/c <> mat with LRAD and supervision    Baseline CGA - Min A    Time 6    Period Weeks    Status New      PT LONG TERM GOAL #4   Title Patient will be able to stand >/= 2 minutes without UE support to improve tolerance for ADLs    Baseline < 30 seconds    Time 6    Period Weeks    Status New  PT LONG TERM GOAL #5   Title Patient will be able to ambulate for >/= 100 ft with LBQC vs RW for improved household mobility    Baseline 20 ft    Time 6    Period Weeks    Status New                   Plan - 09/23/20 1035     Clinical Impression Statement Today's skilled PT session focused on assesment of patient's progress toward STGs. Patient able to meet STG #1-3 today. Made progress toward STG #4 but not to goal level. Patient demonstrating improved 5x sit <> stand to 22.53 secs, and improved ambulation distance to 50 ft with RW and R Hand grip attachment. Continue to require CGA/Min - A with compeltion but demonstrating progress with PT services. Patient will continue to benefit from skilled PT services to progress toward all unmet goals.    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, lumbar fracture December 2020    Examination-Activity Limitations Bed Mobility;Locomotion Level;Sit;Stand;Transfers    Examination-Participation Restrictions Medication Management;Community Activity;Interpersonal Relationship    Stability/Clinical Decision Making Evolving/Moderate complexity    Rehab Potential Fair    PT Frequency 2x / week    PT Duration 6 weeks    PT Treatment/Interventions ADLs/Self Care Home Management;Cryotherapy;Moist Heat;DME Instruction;Gait training;Stair training;Functional mobility training;Therapeutic activities;Therapeutic exercise;Balance training;Neuromuscular re-education;Patient/family education;Orthotic Fit/Training;Manual techniques;Wheelchair mobility training;Passive range of motion;Visual/perceptual remediation/compensation    PT Next Visit Plan Continue gait training w/ RW and hand grip attachment. Work on improving standing tolerance. Weight shifting.    Consulted and Agree with Plan of Care Patient;Family member/caregiver    Family Member Consulted Daughter/Son             Patient will benefit from skilled therapeutic intervention in order to improve the following deficits and impairments:  Abnormal gait, Decreased coordination, Decreased range of motion, Difficulty walking, Impaired tone, Pain, Impaired UE functional use, Decreased safety awareness, Decreased endurance, Decreased activity tolerance, Decreased knowledge of use of DME, Decreased balance, Decreased cognition, Decreased strength, Impaired sensation  Visit Diagnosis: Muscle weakness (generalized)  Difficulty in walking, not elsewhere classified  Unsteadiness on feet  Other abnormalities of gait and mobility     Problem List Patient Active Problem List   Diagnosis Date Noted   Rib pain on right side 03/19/2020    Sundowning 07/04/2019   Bilateral hearing loss 04/16/2019   Urinary frequency 03/21/2019   Chronic right shoulder pain 12/19/2018   Weakness generalized 09/28/2018   Palliative care encounter 09/28/2018   Pressure injury of skin 08/27/2018   COPD (chronic obstructive pulmonary disease) ([place]) 08/26/2018   AMS (altered mental status) 08/22/2018   Altered mental status 08/21/2018   UTI (urinary tract infection) 08/21/2018   Abnormality of gait 08/20/2018   Prolonged Q-T interval on ECG    Dysphagia, post-stroke    Supplemental oxygen dependent    Anxiety state    Fatigue    SOB (shortness of breath)    Hyperglycemia    History of lung cancer    Depression    CKD (chronic kidney disease), stage III (HCC)    Acute blood loss anemia    Fall 07/16/2018   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   Thalamic hemorrhage ([place]) 07/16/2018   Acute spontaneous intraparenchymal intracranial  hemorrhage associated with coagulopathy ([place]) L thalamic 07/14/2018   Cellulitis of arm, right 06/01/2018   Rash and nonspecific skin eruption 06/01/2018   Chest pain 02/02/2018   Thrombocytopenia ([place]) 02/02/2018   Hypokalemia 02/02/2018   Pulmonary embolism ([place]) 02/02/2018   Elevated troponin 02/02/2018   Pulmonary embolus ([place]) 02/02/2018   Non-ST elevation MI (NSTEMI) ([place]) 68/06/2120   Diastolic CHF ([place]) 48/25/0037   Chest pain due to CAD ([place]) 06/26/2017   Dyspnea 06/21/2017   Acute bronchitis 05/15/2017   Oral candidiasis 05/15/2017   Acute pericarditis    Chest pain at rest 12/20/2016   Angina pectoris ([place]) 12/20/2016   Radiation pneumonitis ([place]) 11/09/2016   Pleurisy 10/11/2016   Chest wall pain 10/11/2016   Atrial fibrillation ([place]) 05/21/2016   Adenocarcinoma of right lung ([place]) 01/06/2016   GAD (generalized anxiety disorder) 11/20/2015   Essential hypertension    Cellulitis 09/04/2015   Cellulitis of left axilla    Esophageal ulcer without bleeding    Bilateral lower extremity  edema 04/28/2014   BPH (benign prostatic hyperplasia) 10/08/2012   Anxiety    EKG abnormalities 09/05/2012   Tremor 09/05/2012   Chronic fatigue 09/05/2012   Arthritis    Asthma, chronic    Reflux esophagitis    S/P CABG (coronary artery bypass graft)    GERD (gastroesophageal reflux disease)    Hyperlipidemia    Hypertension    UNSPECIFIED PERIPHERAL VASCULAR DISEASE 06/16/2009    Jones Bales, PT, DPT 09/23/2020, 10:43 AM  [facility] [address] [place], [place], [address] Phone: [phone]   Fax:  [phone]  Name: TAHIR BLANK MRN: [id number] Date of Birth: 12-19-1927

## 2020-09-25 ENCOUNTER — Other Ambulatory Visit: Payer: Self-pay

## 2020-09-25 ENCOUNTER — Ambulatory Visit: Payer: Medicare HMO | Admitting: Occupational Therapy

## 2020-09-25 ENCOUNTER — Ambulatory Visit: Payer: Medicare HMO

## 2020-09-25 DIAGNOSIS — M6281 Muscle weakness (generalized): Secondary | ICD-10-CM

## 2020-09-25 DIAGNOSIS — R29818 Other symptoms and signs involving the nervous system: Secondary | ICD-10-CM

## 2020-09-25 DIAGNOSIS — R2689 Other abnormalities of gait and mobility: Secondary | ICD-10-CM | POA: Diagnosis not present

## 2020-09-25 DIAGNOSIS — R2681 Unsteadiness on feet: Secondary | ICD-10-CM

## 2020-09-25 DIAGNOSIS — R278 Other lack of coordination: Secondary | ICD-10-CM | POA: Diagnosis not present

## 2020-09-25 DIAGNOSIS — R262 Difficulty in walking, not elsewhere classified: Secondary | ICD-10-CM | POA: Diagnosis not present

## 2020-09-25 DIAGNOSIS — R270 Ataxia, unspecified: Secondary | ICD-10-CM | POA: Diagnosis not present

## 2020-09-25 DIAGNOSIS — R29898 Other symptoms and signs involving the musculoskeletal system: Secondary | ICD-10-CM | POA: Diagnosis not present

## 2020-09-25 NOTE — Therapy (Signed)
[facility] [address] [place], [place], [address] Phone: [phone]   Fax:  [phone]  Physical Therapy Treatment  Patient Details  Name: Evan [name] MRN: [id number] Date of Birth: March 22, 1928 Referring Provider (PT): Delice Lesch, MD   Encounter Date: 09/25/2020   PT End of Session - 09/25/20 0934     Visit Number 6    Number of Visits 13    Date for PT Re-Evaluation 10/24/20   POC for 6 weeks, Cert for 60 days   Authorization Type [employer] (10th Visit PN)    Authorization Time Period Awaiting Authorization Approval    PT Start Time 0932    PT Stop Time 1014    PT Time Calculation (min) 42 min    Equipment Utilized During Treatment Gait belt    Activity Tolerance Patient tolerated treatment well;Patient limited by fatigue    Behavior During Therapy [facility] for tasks assessed/performed             Past Medical History:  Diagnosis Date   Adenocarcinoma of right lung ([place]) 01/06/2016   Anginal chest pain at rest [facility])    Chronic non, controlled on Lorazepam   Anxiety    Arthritis    "all over"    BPH (benign prostatic hyperplasia)    CAD (coronary artery disease)    Chronic lower back pain    Complication of anesthesia    "problems making water afterwards"   Depression    DJD (degenerative joint disease)    Esophageal ulcer without bleeding    bid ppi indefinitely   Family history of adverse reaction to anesthesia    "children w/PONV"   GERD (gastroesophageal reflux disease)    Headache    "couple/week maybe" (09/04/2015)   Hemochromatosis    Possible, elevated iron stores, hook-like osteophytes on hand films, normal LFTs   Hepatitis    "yellow jaundice as a baby"   History of ASCVD    MULTIVESSEL   Hyperlipidemia    Hypertension    Osteoarthritis    Pneumonia    "several times; got a little now" (09/04/2015)   Rheumatoid arthritis ([place])    Subdural hematoma ([place])    small after fall  09/2016-plavix held, neurosurgery consulted.  Asymptomatic.     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[place]) 02/2018   on Lovenox lifelong since he failed PO Eliquis    Past Surgical History:  Procedure Laterality Date   ARM SKIN LESION BIOPSY / EXCISION Left 09/02/2015   CATARACT EXTRACTION W/ INTRAOCULAR LENS  IMPLANT, BILATERAL Bilateral    CHOLECYSTECTOMY OPEN     CORNEAL TRANSPLANT Bilateral    "one at [facility]; one at [facility]"   [place] W/ STENT     DE stent ostium into the right radial free graft at OM-, 02-2007   ESOPHAGOGASTRODUODENOSCOPY N/A 11/09/2014   Procedure: ESOPHAGOGASTRODUODENOSCOPY (EGD);  Surgeon: [name], MD;  Location: [facility] ENDOSCOPY;  Service: Endoscopy;  Laterality: N/A;   INGUINAL HERNIA REPAIR     JOINT REPLACEMENT     LEFT HEART CATH AND CORS/GRAFTS ANGIOGRAPHY N/A 06/27/2017   Procedure: LEFT HEART CATH AND CORS/GRAFTS ANGIOGRAPHY;  Surgeon: [name], MD;  Location: [facility] CV LAB;  Service: Cardiovascular;  Laterality: N/A;   NASAL SINUS SURGERY     SHOULDER OPEN ROTATOR CUFF REPAIR Right    TOTAL KNEE ARTHROPLASTY Bilateral  There were no vitals filed for this visit.   Subjective Assessment - 09/25/20 0934     Subjective No new changes/complaints. No falls or pain to report.    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 No/denies                               [facility] Adult PT Treatment/Exercise - 09/25/20 0001       Transfers   Transfers Sit to Stand;Stand to Sit    Sit to Stand 4: Min guard    Sit to Stand  Details Verbal cues for technique    Sit to Stand Details (indicate cue type and reason) completed sit <> stands from w/c with 2" block under LLE to promote improved weight shift/stance onto RLE, completed x 6 reps, with PT providing CGA and cues for upright posture and foot placement to promote improved balance. paitent continues to demo wanting to adduct RLE with functional mobility.    Stand to Sit 4: Min guard      Ambulation/Gait   Ambulation/Gait Yes    Ambulation/Gait Assistance 4: Min guard;4: Min assist    Ambulation/Gait Assistance Details Completed ambulation with RW during session. OT provided patient with personal hand grip attachment for home use, PT educating on how to properly place RUE into orthotic for improved weight bearing. Then upon standing completed ambulation x 57 ft with CGA - Min A from PT. Then progressed distance to x 66 ft with second trial. Paitent does well with a target to ambulate toward. Intermitttent cues for foot position, upright posture, and weight shift.    Ambulation Distance (Feet) 57 Feet   x 1, 66 x 1   Assistive device Rolling walker   with AFO and R Hand Grip Attachment   Gait Pattern Step-to pattern;Step-through pattern;Decreased step length - right;Decreased step length - left;Narrow base of support    Ambulation Surface Level;Indoor      Therapeutic Activites    Therapeutic Activities Other Therapeutic Activities    Other Therapeutic Activities Standing in front of table with intermittent UE support from LUE completed standing tolerance task of putting together puzzle, able to stand 1 minute, 39 seconds on first trial. Then completed second time with patient able to stand 1 minute, 30 seconds. CGA required throughout. Also cues/assistance required for completion of puzzle. Intermittent rest breaks required due to fatigue, no reports of discomfort in low back today.                    PT Education - 09/25/20 1021     Education Details PT  educating daughter on R Hand Orhotic for RW and how to properly place RUE.    Person(s) Educated Patient;Child(ren)    Methods Explanation;Demonstration    Comprehension Verbalized understanding              PT Short Term Goals - 09/23/20 0932       PT SHORT TERM GOAL #1   Title Patient will be independent with initial HEP with family member assistance to improve functional outcomes (All STGs Due: 09/15/20)    Baseline reports independence  with assistance, progressed today during session    Time 3    Period Weeks    Status Achieved    Target Date 09/15/20      PT SHORT TERM GOAL #2   Title Patient will be able to perform 5x sit <> stand in < 45 seconds to demonstrate improvement in functional mobility and reduced risk for falls.    Baseline unable to complete, 3 reps in 22.37 secs; 22.53 secs    Time 3    Period Weeks    Status Achieved      PT SHORT TERM GOAL #3   Title Patient will be able to ambulate for >/= 50 ft with LBQC vs RW for improved household mobility    Baseline 20 ft, 50 ft with RW and CGA/Min-A    Time 3    Period Weeks    Status Achieved      PT SHORT TERM GOAL #4   Title Patient will be able to stand without UE support and close supervision for >/= 1 minute for improved tolerance for ADLs    Baseline 20-30 secs, CGA; able to stand 43.69 secs still require CGA    Time 3    Period Weeks    Status Not Met               PT Long Term Goals - 08/25/20 1334       PT LONG TERM GOAL #1   Title Patient will be independent with final HEP for strength/balance with family member assistance (All LTGs Due: 10/06/20)    Baseline HEP from prior POC    Time 6    Period Weeks    Status New    Target Date 10/06/20      PT LONG TERM GOAL #2   Title Patient will be able to complete 5x sit <> stand in </= 30 seconds with CGA to demo improved balance and functional mobility    Baseline unable to complete    Time 6    Period Weeks    Status New      PT LONG  TERM GOAL #3   Title Patient will demo ability to complete transfers from w/c <> mat with LRAD and supervision    Baseline CGA - Min A    Time 6    Period Weeks    Status New      PT LONG TERM GOAL #4   Title Patient will be able to stand >/= 2 minutes without UE support to improve tolerance for ADLs    Baseline < 30 seconds    Time 6    Period Weeks    Status New      PT LONG TERM GOAL #5   Title Patient will be able to ambulate for >/= 100 ft with LBQC vs RW for improved household mobility    Baseline 20 ft    Time 6    Period Weeks    Status New                   Plan - 09/25/20 1022     Clinical Impression Statement OT provided patient with personal hand orthotic for RW, therefore educating on proper use and continued gait training. Patient able to ambulate x 57 ft, x 66 ft continue to demo improved ambulation distance. Continue to require CGA - Min A. Rest of session focused on continued standing tolerance and activites to promote weight shift/stance on RLE. Will continue  to progress.    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, lumbar fracture December 2020    Examination-Activity Limitations Bed Mobility;Locomotion Level;Sit;Stand;Transfers    Examination-Participation Restrictions Medication Management;Community Activity;Interpersonal Relationship    Stability/Clinical Decision Making Evolving/Moderate complexity    Rehab Potential Fair    PT Frequency 2x / week    PT Duration 6 weeks    PT Treatment/Interventions ADLs/Self Care Home Management;Cryotherapy;Moist Heat;DME Instruction;Gait training;Stair training;Functional mobility training;Therapeutic activities;Therapeutic exercise;Balance training;Neuromuscular  re-education;Patient/family education;Orthotic Fit/Training;Manual techniques;Wheelchair mobility training;Passive range of motion;Visual/perceptual remediation/compensation    PT Next Visit Plan Continue gait training w/ RW and hand grip attachment. Work on improving standing tolerance. Weight shifting. patient does well with targets. Bean Bag Toss    Consulted and Agree with Plan of Care Patient;Family member/caregiver    Family Member Consulted Daughter/Son             Patient will benefit from skilled therapeutic intervention in order to improve the following deficits and impairments:  Abnormal gait, Decreased coordination, Decreased range of motion, Difficulty walking, Impaired tone, Pain, Impaired UE functional use, Decreased safety awareness, Decreased endurance, Decreased activity tolerance, Decreased knowledge of use of DME, Decreased balance, Decreased cognition, Decreased strength, Impaired sensation  Visit Diagnosis: Muscle weakness (generalized)  Difficulty in walking, not elsewhere classified  Unsteadiness on feet  Other abnormalities of gait and mobility     Problem List Patient Active Problem List   Diagnosis Date Noted   Rib pain on right side 03/19/2020   Sundowning 07/04/2019   Bilateral hearing loss 04/16/2019   Urinary frequency 03/21/2019   Chronic right shoulder pain 12/19/2018   Weakness generalized 09/28/2018   Palliative care encounter 09/28/2018   Pressure injury of skin 08/27/2018   COPD (chronic obstructive pulmonary disease) ([place]) 08/26/2018   AMS (altered mental status) 08/22/2018   Altered mental status 08/21/2018   UTI (urinary tract infection) 08/21/2018   Abnormality of gait 08/20/2018   Prolonged Q-T interval on ECG    Dysphagia, post-stroke    Supplemental oxygen dependent    Anxiety state    Fatigue    SOB (shortness of breath)    Hyperglycemia    History of lung cancer    Depression    CKD (chronic kidney disease), stage III  ([place])    Acute blood loss anemia    Fall 07/16/2018   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   Thalamic hemorrhage ([place]) 07/16/2018   Acute spontaneous 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 ([place]) L thalamic 07/14/2018   Cellulitis of arm, right 06/01/2018   Rash and nonspecific skin eruption 06/01/2018   Chest pain 02/02/2018   Thrombocytopenia ([place]) 02/02/2018   Hypokalemia 02/02/2018   Pulmonary embolism ([place]) 02/02/2018   Elevated troponin 02/02/2018   Pulmonary embolus ([place]) 02/02/2018   Non-ST elevation MI (NSTEMI) ([place]) 37/34/2876   Diastolic CHF ([place]) 81/15/7262   Chest pain due to CAD ([place]) 06/26/2017   Dyspnea 06/21/2017   Acute bronchitis 05/15/2017   Oral candidiasis 05/15/2017   Acute pericarditis    Chest pain at rest 12/20/2016   Angina pectoris ([place]) 12/20/2016   Radiation pneumonitis ([place]) 11/09/2016   Pleurisy 10/11/2016   Chest wall pain 10/11/2016   Atrial fibrillation ([place]) 05/21/2016   Adenocarcinoma of right lung ([place]) 01/06/2016   GAD (generalized anxiety disorder) 11/20/2015  Essential hypertension    Cellulitis 09/04/2015   Cellulitis of left axilla    Esophageal ulcer without bleeding    Bilateral lower extremity edema 04/28/2014   BPH (benign prostatic hyperplasia) 10/08/2012   Anxiety    EKG abnormalities 09/05/2012   Tremor 09/05/2012   Chronic fatigue 09/05/2012   Arthritis    Asthma, chronic    Reflux esophagitis    S/P CABG (coronary artery bypass graft)    GERD (gastroesophageal reflux disease)    Hyperlipidemia    Hypertension    UNSPECIFIED PERIPHERAL VASCULAR DISEASE 06/16/2009    Jones Bales, PT, DPT 09/25/2020, 10:26 AM  [facility] [name] [place], [place], [address] Phone: [phone]   Fax:  [phone]  Name: HARSHIL CAVALLARO MRN: [id number] Date of Birth: 02-May-1927

## 2020-09-25 NOTE — Therapy (Addendum)
[facility] [address] [place], [place], [address] Phone: [phone]   Fax:  [phone]  Occupational Therapy Treatment  Patient Details  Name: Evan [name] MRN: [id number] Date of Birth: 1927-12-19 Referring Provider (OT): Dr. Jamse Arn   Encounter Date: 09/25/2020   OT End of Session - 09/25/20 0906     Visit Number 7    Number of Visits 17    Date for OT Re-Evaluation 10/24/20    Authorization Type [employer] [place] - Visit Number 7    Authorization - Number of Visits 10    Progress Note Due on Visit 10    OT Start Time [phone]    OT Stop Time 0930    OT Time Calculation (min) 39 min    Activity Tolerance Patient tolerated treatment well    Behavior During Therapy [facility] for tasks assessed/performed             Past Medical History:  Diagnosis Date   Adenocarcinoma of right lung ([place]) 01/06/2016   Anginal chest pain at rest [facility])    Chronic non, controlled on Lorazepam   Anxiety    Arthritis    "all over"    BPH (benign prostatic hyperplasia)    CAD (coronary artery disease)    Chronic lower back pain    Complication of anesthesia    "problems making water afterwards"   Depression    DJD (degenerative joint disease)    Esophageal ulcer without bleeding    bid ppi indefinitely   Family history of adverse reaction to anesthesia    "children w/PONV"   GERD (gastroesophageal reflux disease)    Headache    "couple/week maybe" (09/04/2015)   Hemochromatosis    Possible, elevated iron stores, hook-like osteophytes on hand films, normal LFTs   Hepatitis    "yellow jaundice as a baby"   History of ASCVD    MULTIVESSEL   Hyperlipidemia    Hypertension    Osteoarthritis    Pneumonia    "several times; got a little now" (09/04/2015)   Rheumatoid arthritis ([place])    Subdural hematoma ([place])    small after fall 09/2016-plavix held, neurosurgery consulted.  Asymptomatic.      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[place]) 02/2018   on Lovenox lifelong since he failed PO Eliquis    Past Surgical History:  Procedure Laterality Date   ARM SKIN LESION BIOPSY / EXCISION Left 09/02/2015   CATARACT EXTRACTION W/ INTRAOCULAR LENS  IMPLANT, BILATERAL Bilateral    CHOLECYSTECTOMY OPEN     CORNEAL TRANSPLANT Bilateral    "one at [facility]; one at [facility]"   [place] W/ STENT     DE stent ostium into the right radial free graft at [facility]-, 02-2007   ESOPHAGOGASTRODUODENOSCOPY N/A 11/09/2014   Procedure: ESOPHAGOGASTRODUODENOSCOPY (EGD);  Surgeon: [name], MD;  Location: [facility] ENDOSCOPY;  Service: Endoscopy;  Laterality: N/A;   INGUINAL HERNIA REPAIR     JOINT REPLACEMENT     LEFT HEART CATH AND CORS/GRAFTS ANGIOGRAPHY N/A 06/27/2017   Procedure: LEFT HEART CATH AND CORS/GRAFTS ANGIOGRAPHY;  Surgeon: [name], MD;  Location: [facility] CV LAB;  Service: Cardiovascular;  Laterality: N/A;   NASAL SINUS SURGERY     SHOULDER OPEN ROTATOR CUFF REPAIR Right    TOTAL KNEE ARTHROPLASTY Bilateral     There were no vitals  filed for this visit.   Subjective Assessment - 09/25/20 0906     Subjective  Denies pain    Pertinent History hx of Thalamic hemorrhage.        PMH:  HOH, CAD, RA, Hx of R RTC repair, hx of bilateral knee arthroplasty, hx of CABG, hx of aortic andurysm repair, hx of corneal transplant. hx of chronic low back pain, depression, R lung adenocarcinoma, posssible radiation (not active) pneumonitis, A fib, hx of DVT/PE--on treatment dose Lovenox, SDH 09/2016, chronic thrombocytopenia    Patient Stated Goals incr RUE functional use    Currently in Pain? No/denies                 Treatment: Cane exercises seated shoulder flexion biceps curls, chest press, 2 sets of 5 reps, min facilitation/ v.c for control. Pt was issued walker splint for RUE, therapist adjusted strap. Pt stood  x 2 reps with walker and walker splint RUE for  approx 1 mins with min A and mod v.c for foot placement Table top arm bike with min A/ v.c  for controlled movements, 3 sets of 10-15 revolutions. Removing medium and large pegs from semi circle, emphasis on controlled movements, min v.c/ facilitation several rest breaks Table slides for shoulder flexion and then horizontal abduction 10-15 reps each, min v.c                    OT Short Term Goals - 09/25/20 0913       OT SHORT TERM GOAL #1   Title Pt/family will be independent with HEP for incr RUE functional use.--check STGs 09/24/20    Time 4    Period Weeks    Status New      OT SHORT TERM GOAL #2   Title Pt will improve LUE coordination and low level functional reaching as shown by ability to grasp/release cylinder object in at least 3/5 trials.    Time 4    Period Weeks    Status New      OT SHORT TERM GOAL #3   Title Pt will be able to pick up 1" object using pinch at least 2/5 trials for incr coordination for ADLs.    Time 4    Period Weeks    Status New      OT SHORT TERM GOAL #4   Title --      OT SHORT TERM GOAL #5   Title --               OT Long Term Goals - 08/25/20 1513       OT LONG TERM GOAL #1   Title Pt will incorporate RUE functionally in at least 3 additional ADL tasks consistently.    Baseline currently only using to push up/reach back for w/c and lock w/c brake with cues    Time 8    Period Weeks    Status New      OT LONG TERM GOAL #2   Title Pt will improve RUE coordination as shown by improving score on box and blocks test by at least 4.    Baseline 4 blocks.    Time 8    Period Weeks    Status New      OT LONG TERM GOAL #3   Title Pt will improve LUE coordination and functional reaching to midlevel as shown by ability to grasp/release cylinder object in at least 3/5 trials.    Time 8  Period Weeks    Status New      OT LONG TERM GOAL #4   Title Pt will be able to  pick up 1" object using pinch and place in bowl at least 4/5 trials for incr coordination for ADLs.    Time 8    Period Weeks    Status New                   Plan - 09/25/20 0911     Clinical Impression Statement Pt is progressing towards goals .   Pt demonstrates improving UE functional use. and control.    OT Occupational Profile and History Detailed Assessment- Review of Records and additional review of physical, cognitive, psychosocial history related to current functional performance    Occupational performance deficits (Please refer to evaluation for details): ADL's;IADL's    Body Structure / Function / Physical Skills ADL;Decreased knowledge of use of DME;Strength;UE functional use;ROM;FMC;GMC;Dexterity;Mobility;Coordination    Rehab Potential Fair    Modification or Assistance to Complete Evaluation  Min-Moderate modification of tasks or assist with assess necessary to complete eval    OT Frequency 2x / week    OT Duration 8 weeks    OT Treatment/Interventions Moist Heat;Fluidtherapy;Self-care/ADL training;DME and/or AE instruction;Splinting;Therapeutic activities;Therapeutic exercise;Neuromuscular education;Functional Mobility Training;Passive range of motion;Patient/family education;Manual Therapy    Plan RUE functional use, standing tolerance,  emphasis on slow, controlled movements    OT Home Exercise Plan Pt has been doing shoulder flex with cane and theraband exercises (shoulder ext, scapular retraction, horizontal abduction) per dtr--instructed to hold on theraband exercises due to decr control RUE    Family Member Consulted daughter,             Patient will benefit from skilled therapeutic intervention in order to improve the following deficits and impairments:   Body Structure / Function / Physical Skills: ADL, Decreased knowledge of use of DME, Strength, UE functional use, ROM, FMC, GMC, Dexterity, Mobility, Coordination       Visit Diagnosis: Muscle  weakness (generalized)  Other lack of coordination  Ataxia  Other symptoms and signs involving the nervous system  Unsteadiness on feet    Problem List Patient Active Problem List   Diagnosis Date Noted   Rib pain on right side 03/19/2020   Sundowning 07/04/2019   Bilateral hearing loss 04/16/2019   Urinary frequency 03/21/2019   Chronic right shoulder pain 12/19/2018   Weakness generalized 09/28/2018   Palliative care encounter 09/28/2018   Pressure injury of skin 08/27/2018   COPD (chronic obstructive pulmonary disease) ([place]) 08/26/2018   AMS (altered mental status) 08/22/2018   Altered mental status 08/21/2018   UTI (urinary tract infection) 08/21/2018   Abnormality of gait 08/20/2018   Prolonged Q-T interval on ECG    Dysphagia, post-stroke    Supplemental oxygen dependent    Anxiety state    Fatigue    SOB (shortness of breath)    Hyperglycemia    History of lung cancer    Depression    CKD (chronic kidney disease), stage III (H[facility])    Acute blood loss anemia    Fall 07/16/2018   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   Acute spontaneous 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 ([place]) L thalamic 07/14/2018   Cellulitis of arm, right 06/01/2018   Rash and nonspecific skin eruption 06/01/2018   Chest pain 02/02/2018   Thrombocytopenia ([place]) 02/02/2018   Hypokalemia 02/02/2018   Pulmonary embolism ([place]) 02/02/2018  Elevated troponin 02/02/2018   Pulmonary embolus ([place]) 02/02/2018   Non-ST elevation MI (NSTEMI) ([place]) 96/22/2979   Diastolic CHF ([place]) 89/21/1941   Chest pain due to CAD ([place]) 06/26/2017   Dyspnea 06/21/2017   Acute bronchitis 05/15/2017   Oral candidiasis 05/15/2017   Acute pericarditis    Chest pain at rest 12/20/2016   Angina pectoris ([place]) 12/20/2016   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   Pleurisy 10/11/2016   Chest wall pain 10/11/2016   Atrial fibrillation ([place])  05/21/2016   Adenocarcinoma of right lung ([place]) 01/06/2016   GAD (generalized anxiety disorder) 11/20/2015   Essential hypertension    Cellulitis 09/04/2015   Cellulitis of left axilla    Esophageal ulcer without bleeding    Bilateral lower extremity edema 04/28/2014   BPH (benign prostatic hyperplasia) 10/08/2012   Anxiety    EKG abnormalities 09/05/2012   Tremor 09/05/2012   Chronic fatigue 09/05/2012   Arthritis    Asthma, chronic    Reflux esophagitis    S/P CABG (coronary artery bypass graft)    GERD (gastroesophageal reflux disease)    Hyperlipidemia    Hypertension    UNSPECIFIED PERIPHERAL VASCULAR DISEASE 06/16/2009    Tonilynn Bieker 09/25/2020, 9:13 AM  [facility] [address] [place], [place], [address] Phone: [phone]   Fax:  [phone]  Name: Evan [name] MRN: [id number] Date of Birth: 10-29-1927

## 2020-09-29 ENCOUNTER — Ambulatory Visit: Payer: Medicare HMO | Admitting: Occupational Therapy

## 2020-09-29 ENCOUNTER — Ambulatory Visit: Payer: Medicare HMO

## 2020-09-29 ENCOUNTER — Encounter: Payer: Self-pay | Admitting: Occupational Therapy

## 2020-09-29 ENCOUNTER — Other Ambulatory Visit: Payer: Self-pay

## 2020-09-29 VITALS — HR 84

## 2020-09-29 DIAGNOSIS — R2689 Other abnormalities of gait and mobility: Secondary | ICD-10-CM | POA: Diagnosis not present

## 2020-09-29 DIAGNOSIS — R270 Ataxia, unspecified: Secondary | ICD-10-CM

## 2020-09-29 DIAGNOSIS — R29818 Other symptoms and signs involving the nervous system: Secondary | ICD-10-CM | POA: Diagnosis not present

## 2020-09-29 DIAGNOSIS — R29898 Other symptoms and signs involving the musculoskeletal system: Secondary | ICD-10-CM

## 2020-09-29 DIAGNOSIS — M6281 Muscle weakness (generalized): Secondary | ICD-10-CM

## 2020-09-29 DIAGNOSIS — R278 Other lack of coordination: Secondary | ICD-10-CM | POA: Diagnosis not present

## 2020-09-29 DIAGNOSIS — R2681 Unsteadiness on feet: Secondary | ICD-10-CM | POA: Diagnosis not present

## 2020-09-29 DIAGNOSIS — R262 Difficulty in walking, not elsewhere classified: Secondary | ICD-10-CM | POA: Diagnosis not present

## 2020-09-29 NOTE — Therapy (Addendum)
[facility] [address] [place], [place], [address] Phone: [phone]   Fax:  [phone]  Occupational Therapy Treatment  Patient Details  Name: Evan [name] MRN: [id number] Date of Birth: 1927-11-03 Referring Provider (OT): Dr. Jamse Arn   Encounter Date: 09/29/2020   OT End of Session - 09/29/20 0935     Visit Number 8    Number of Visits 17    Date for OT Re-Evaluation 10/24/20    Authorization Type [employer] [place] - Visit Number 8    Authorization - Number of Visits 10    Progress Note Due on Visit 10    OT Start Time [phone]    OT Stop Time 1015    OT Time Calculation (min) 41 min    Activity Tolerance Patient tolerated treatment well    Behavior During Therapy [facility] for tasks assessed/performed             Past Medical History:  Diagnosis Date   Adenocarcinoma of right lung ([place]) 01/06/2016   Anginal chest pain at rest [facility])    Chronic non, controlled on Lorazepam   Anxiety    Arthritis    "all over"    BPH (benign prostatic hyperplasia)    CAD (coronary artery disease)    Chronic lower back pain    Complication of anesthesia    "problems making water afterwards"   Depression    DJD (degenerative joint disease)    Esophageal ulcer without bleeding    bid ppi indefinitely   Family history of adverse reaction to anesthesia    "children w/PONV"   GERD (gastroesophageal reflux disease)    Headache    "couple/week maybe" (09/04/2015)   Hemochromatosis    Possible, elevated iron stores, hook-like osteophytes on hand films, normal LFTs   Hepatitis    "yellow jaundice as a baby"   History of ASCVD    MULTIVESSEL   Hyperlipidemia    Hypertension    Osteoarthritis    Pneumonia    "several times; got a little now" (09/04/2015)   Rheumatoid arthritis ([place])    Subdural hematoma ([place])    small after fall 09/2016-plavix held, neurosurgery consulted.  Asymptomatic.      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[place]) 02/2018   on Lovenox lifelong since he failed PO Eliquis    Past Surgical History:  Procedure Laterality Date   ARM SKIN LESION BIOPSY / EXCISION Left 09/02/2015   CATARACT EXTRACTION W/ INTRAOCULAR LENS  IMPLANT, BILATERAL Bilateral    CHOLECYSTECTOMY OPEN     CORNEAL TRANSPLANT Bilateral    "one at [facility]; one at [facility]"   [place] W/ STENT     DE stent ostium into the right radial free graft at OM-, 02-2007   ESOPHAGOGASTRODUODENOSCOPY N/A 11/09/2014   Procedure: ESOPHAGOGASTRODUODENOSCOPY (EGD);  Surgeon: [name], MD;  Location: [facility] ENDOSCOPY;  Service: Endoscopy;  Laterality: N/A;   INGUINAL HERNIA REPAIR     JOINT REPLACEMENT     LEFT HEART CATH AND CORS/GRAFTS ANGIOGRAPHY N/A 06/27/2017   Procedure: LEFT HEART CATH AND CORS/GRAFTS ANGIOGRAPHY;  Surgeon: [name], MD;  Location: [place] CV LAB;  Service: Cardiovascular;  Laterality: N/A;   NASAL SINUS SURGERY     SHOULDER OPEN ROTATOR CUFF REPAIR Right    TOTAL KNEE ARTHROPLASTY Bilateral     There were no vitals  filed for this visit.   Subjective Assessment - 09/29/20 0935     Subjective  Dtr. reports that pt is using RUE more (folding washcloth, bathing) and seems to have more control with RUE and is looser.    Patient is accompanied by: Family member    Pertinent History hx of Thalamic hemorrhage.        PMH:  HOH, CAD, RA, Hx of R RTC repair, hx of bilateral knee arthroplasty, hx of CABG, hx of aortic andurysm repair, hx of corneal transplant. hx of chronic low back pain, depression, R lung adenocarcinoma, posssible radiation (not active) pneumonitis, A fib, hx of DVT/PE--on treatment dose Lovenox, SDH 09/2016, chronic thrombocytopenia    Patient Stated Goals incr RUE functional use    Currently in Pain? No/denies               Picking up 1-inch blocks using 2-point pinch and  placing in container with min-mod cues/min facilitation for slow/controlled movements and normal movement patterns.    Grasp/release and low range functional reaching of cones with min-mod cueing/facilitation for control/stabilization.  Simulated donning Deodorant with RUE (bottle wrapped with coban) to place under LUE.  Recommended trying at home.  Cup with coban wrapped with RUE with min facilitation/stabilization then with BUEs with min difficulty.  Recommended trying drinking from travel cup/cup with lid with BUEs at home.  Reviewed recommendation to eat finger foods with RUE.  Standing for approx 58min x2 with min-mod cueing/min A for RLE and RUE placement for wt. Bearing (light wt. Bearing over dome for RUE).    Light wt. Bearing through elbow and hand with body on arm movements with noted decr in RUE ataxia/tremors with min-mod cueing./facilitation for normal movement patterns.    AAROM shoulder flex/elbow ext and light wt. Bearing with tilted stool with min-mod verbal/tactile cues to slow down and focus on breathing coordinated with RUE movement.  Focused on positioning of RUE in neutral shoulder position at rest with min-mod cues/facilitation.        OT Short Term Goals - 09/29/20 0936       OT SHORT TERM GOAL #1   Title Pt/family will be independent with HEP for incr RUE functional use.--check STGs 09/24/20    Time 4    Period Weeks    Status Achieved      OT SHORT TERM GOAL #2   Title Pt will improve LUE coordination and low level functional reaching as shown by ability to grasp/release cylinder object in at least 3/5 trials.    Time 4    Period Weeks    Status On-going   09/29/20.  needs min A     OT SHORT TERM GOAL #3   Title Pt will be able to pick up 1" object using pinch at least 2/5 trials for incr coordination for ADLs.    Time 4    Period Weeks    Status Achieved      OT SHORT TERM GOAL #4   Title --      OT SHORT TERM GOAL #5   Title --                OT Long Term Goals - 08/25/20 1513       OT LONG TERM GOAL #1   Title Pt will incorporate RUE functionally in at least 3 additional ADL tasks consistently.    Baseline currently only using to push up/reach back for w/c and lock w/c brake with cues  Time 8    Period Weeks    Status New      OT LONG TERM GOAL #2   Title Pt will improve RUE coordination as shown by improving score on box and blocks test by at least 4.    Baseline 4 blocks.    Time 8    Period Weeks    Status New      OT LONG TERM GOAL #3   Title Pt will improve LUE coordination and functional reaching to midlevel as shown by ability to grasp/release cylinder object in at least 3/5 trials.    Time 8    Period Weeks    Status New      OT LONG TERM GOAL #4   Title Pt will be able to pick up 1" object using pinch and place in bowl at least 4/5 trials for incr coordination for ADLs.    Time 8    Period Weeks    Status New                   Plan - 09/29/20 0936     Clinical Impression Statement Pt is making progress with improving RUE functional use and control.    OT Occupational Profile and History Detailed Assessment- Review of Records and additional review of physical, cognitive, psychosocial history related to current functional performance    Occupational performance deficits (Please refer to evaluation for details): ADL's;IADL's    Body Structure / Function / Physical Skills ADL;Decreased knowledge of use of DME;Strength;UE functional use;ROM;FMC;GMC;Dexterity;Mobility;Coordination    Rehab Potential Fair    Modification or Assistance to Complete Evaluation  Min-Moderate modification of tasks or assist with assess necessary to complete eval    OT Frequency 2x / week    OT Duration 8 weeks    OT Treatment/Interventions Moist Heat;Fluidtherapy;Self-care/ADL training;DME and/or AE instruction;Splinting;Therapeutic activities;Therapeutic exercise;Neuromuscular education;Functional Mobility  Training;Passive range of motion;Patient/family education;Manual Therapy    Plan RUE functional use, emphasis on slow, controlled movements, light wt. bearing    OT Home Exercise Plan Pt has been doing shoulder flex with cane and theraband exercises (shoulder ext, scapular retraction, horizontal abduction) per dtr--instructed to hold on theraband exercises due to decr control RUE    Family Member Consulted daughter,             Patient will benefit from skilled therapeutic intervention in order to improve the following deficits and impairments:   Body Structure / Function / Physical Skills: ADL, Decreased knowledge of use of DME, Strength, UE functional use, ROM, FMC, GMC, Dexterity, Mobility, Coordination       Visit Diagnosis: Muscle weakness (generalized)  Other lack of coordination  Ataxia  Other symptoms and signs involving the nervous system  Unsteadiness on feet  Other symptoms and signs involving the musculoskeletal system    Problem List Patient Active Problem List   Diagnosis Date Noted   Rib pain on right side 03/19/2020   Sundowning 07/04/2019   Bilateral hearing loss 04/16/2019   Urinary frequency 03/21/2019   Chronic right shoulder pain 12/19/2018   Weakness generalized 09/28/2018   Palliative care encounter 09/28/2018   Pressure injury of skin 08/27/2018   COPD (chronic obstructive pulmonary disease) ([place]) 08/26/2018   AMS (altered mental status) 08/22/2018   Altered mental status 08/21/2018   UTI (urinary tract infection) 08/21/2018   Abnormality of gait 08/20/2018   Prolonged Q-T interval on ECG    Dysphagia, post-stroke    Supplemental oxygen dependent  Anxiety state    Fatigue    SOB (shortness of breath)    Hyperglycemia    History of lung cancer    Depression    CKD (chronic kidney disease), stage III (HCC)    Acute blood loss anemia    Fall 07/16/2018   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    Acute spontaneous 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 ([place]) L thalamic 07/14/2018   Cellulitis of arm, right 06/01/2018   Rash and nonspecific skin eruption 06/01/2018   Chest pain 02/02/2018   Thrombocytopenia ([place]) 02/02/2018   Hypokalemia 02/02/2018   Pulmonary embolism ([place]) 02/02/2018   Elevated troponin 02/02/2018   Pulmonary embolus ([place]) 02/02/2018   Non-ST elevation MI (NSTEMI) ([place]) 05/39/7673   Diastolic CHF ([place]) 41/93/7902   Chest pain due to CAD ([place]) 06/26/2017   Dyspnea 06/21/2017   Acute bronchitis 05/15/2017   Oral candidiasis 05/15/2017   Acute pericarditis    Chest pain at rest 12/20/2016   Angina pectoris ([place]) 12/20/2016   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   Pleurisy 10/11/2016   Chest wall pain 10/11/2016   Atrial fibrillation ([place]) 05/21/2016   Adenocarcinoma of right lung ([place]) 01/06/2016   GAD (generalized anxiety disorder) 11/20/2015   Essential hypertension    Cellulitis 09/04/2015   Cellulitis of left axilla    Esophageal ulcer without bleeding    Bilateral lower extremity edema 04/28/2014   BPH (benign prostatic hyperplasia) 10/08/2012   Anxiety    EKG abnormalities 09/05/2012   Tremor 09/05/2012   Chronic fatigue 09/05/2012   Arthritis    Asthma, chronic    Reflux esophagitis    S/P CABG (coronary artery bypass graft)    GERD (gastroesophageal reflux disease)    Hyperlipidemia    Hypertension    UNSPECIFIED PERIPHERAL VASCULAR DISEASE 06/16/2009    [facility] 09/29/2020, 2:43 PM  [facility] [address] [place], [place], [address] Phone: [phone]   Fax:  [phone]  Name: KEANTHONY POOLE MRN: [id number] Date of Birth: 07/03/1927

## 2020-09-29 NOTE — Therapy (Signed)
[facility] [address] [place], [place], [address] Phone: [phone]   Fax:  [phone]  Physical Therapy Treatment  Patient Details  Name: Evan [name] MRN: [id number] Date of Birth: 06/12/1927 Referring Provider (PT): Delice Lesch, MD   Encounter Date: 09/29/2020   PT End of Session - 09/29/20 1017     Visit Number 7    Number of Visits 13    Date for PT Re-Evaluation 10/24/20    Authorization Type [employer] (10th Visit PN)    Authorization Time Period Awaiting Authorization Approval    PT Start Time 1020    PT Stop Time 1100    PT Time Calculation (min) 40 min    Equipment Utilized During Treatment Gait belt    Activity Tolerance Patient tolerated treatment well;Patient limited by fatigue    Behavior During Therapy [facility] for tasks assessed/performed             Past Medical History:  Diagnosis Date   Adenocarcinoma of right lung ([place]) 01/06/2016   Anginal chest pain at rest [facility])    Chronic non, controlled on Lorazepam   Anxiety    Arthritis    "all over"    BPH (benign prostatic hyperplasia)    CAD (coronary artery disease)    Chronic lower back pain    Complication of anesthesia    "problems making water afterwards"   Depression    DJD (degenerative joint disease)    Esophageal ulcer without bleeding    bid ppi indefinitely   Family history of adverse reaction to anesthesia    "children w/PONV"   GERD (gastroesophageal reflux disease)    Headache    "couple/week maybe" (09/04/2015)   Hemochromatosis    Possible, elevated iron stores, hook-like osteophytes on hand films, normal LFTs   Hepatitis    "yellow jaundice as a baby"   History of ASCVD    MULTIVESSEL   Hyperlipidemia    Hypertension    Osteoarthritis    Pneumonia    "several times; got a little now" (09/04/2015)   Rheumatoid arthritis ([place])    Subdural hematoma ([place])    small after fall 09/2016-plavix held, neurosurgery  consulted.  Asymptomatic.     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[place]) 02/2018   on Lovenox lifelong since he failed PO Eliquis    Past Surgical History:  Procedure Laterality Date   ARM SKIN LESION BIOPSY / EXCISION Left 09/02/2015   CATARACT EXTRACTION W/ INTRAOCULAR LENS  IMPLANT, BILATERAL Bilateral    CHOLECYSTECTOMY OPEN     CORNEAL TRANSPLANT Bilateral    "one at [facility]; one at [facility]"   [place] W/ STENT     DE stent ostium into the right radial free graft at OM-, 02-2007   ESOPHAGOGASTRODUODENOSCOPY N/A 11/09/2014   Procedure: ESOPHAGOGASTRODUODENOSCOPY (EGD);  Surgeon: [name], MD;  Location: [facility] ENDOSCOPY;  Service: Endoscopy;  Laterality: N/A;   INGUINAL HERNIA REPAIR     JOINT REPLACEMENT     LEFT HEART CATH AND CORS/GRAFTS ANGIOGRAPHY N/A 06/27/2017   Procedure: LEFT HEART CATH AND CORS/GRAFTS ANGIOGRAPHY;  Surgeon: [name], MD;  Location: [place] CV LAB;  Service: Cardiovascular;  Laterality: N/A;   NASAL SINUS SURGERY     SHOULDER OPEN ROTATOR CUFF REPAIR Right    TOTAL KNEE ARTHROPLASTY Bilateral     Vitals:   09/29/20 1049 09/29/20 1053  Pulse: 60 84  SpO2: 95% 94%     Subjective Assessment - 09/29/20 1022     Subjective No new changes/complaints. No falls or pain to report.  Walker splint working well    Patient is accompained by: Family member    Pertinent History HTN, HLD, GERD, DJD, OA, RA, Hearing Loss, CAD, adenocarcinoma of right lung, Anxiety, A fib,, SDH 09/2016, chronic thrombocytopenia, left thalamic hemorrhage 07/2018, lumbar fracture December 2020    Limitations Sitting;Standing;Walking;House hold activities    Patient Stated Goals walk better; build up strength in the RLE (per daughter)                               [facility] Adult PT Treatment/Exercise - 09/29/20 0001       Transfers   Transfers Sit to Stand    Sit to Stand 4:  Min guard    Sit to Stand Details Verbal cues for technique;Tactile cues for sequencing;Tactile cues for weight shifting    Stand to Sit 4: Min guard    Number of Reps 10 reps    Comments completed from w/c      Ambulation/Gait   Ambulation/Gait Yes    Ambulation/Gait Assistance 4: Min guard;4: Min assist    Ambulation Distance (Feet) 18 Feet   18 and 44 ft respectively   Assistive device Rolling walker    Gait Pattern Step-to pattern;Step-through pattern;Decreased step length - right;Decreased step length - left;Narrow base of support    Ambulation Surface Level;Indoor    Pre-Gait Activities static standing with CGA for 60s duration    Gait Comments demos extensor tone in RLE with ambulation and transfers.      Knee/Hip Exercises: Stretches   Other Knee/Hip Stretches static hamstring stretch, 2 min duration, B      Knee/Hip Exercises: Seated   Long Arc Quad Left;1 set;10 reps    Heel Slides Strengthening;Left;1 set;10 reps    Marching Left;1 set;10 reps                      PT Short Term Goals - 09/23/20 0932       PT SHORT TERM GOAL #1   Title Patient will be independent with initial HEP with family member assistance to improve functional outcomes (All STGs Due: 09/15/20)    Baseline reports independence with assistance, progressed today during session    Time 3    Period Weeks    Status Achieved    Target Date 09/15/20      PT SHORT TERM GOAL #2   Title Patient will be able to perform 5x sit <> stand in < 45 seconds to demonstrate improvement in functional mobility and reduced risk for falls.    Baseline unable to complete, 3 reps in 22.37 secs; 22.53 secs    Time 3    Period Weeks    Status Achieved      PT SHORT TERM GOAL #3   Title Patient will be able to ambulate for >/= 50 ft with LBQC vs RW for improved household mobility    Baseline 20 ft, 50 ft with RW and CGA/Min-A    Time 3    Period Weeks    Status Achieved      PT SHORT TERM GOAL #4    Title Patient will be able to stand without UE support and close supervision for >/= 1 minute for improved tolerance for ADLs  Baseline 20-30 secs, CGA; able to stand 43.69 secs still require CGA    Time 3    Period Weeks    Status Not Met               PT Long Term Goals - 08/25/20 1334       PT LONG TERM GOAL #1   Title Patient will be independent with final HEP for strength/balance with family member assistance (All LTGs Due: 10/06/20)    Baseline HEP from prior POC    Time 6    Period Weeks    Status New    Target Date 10/06/20      PT LONG TERM GOAL #2   Title Patient will be able to complete 5x sit <> stand in </= 30 seconds with CGA to demo improved balance and functional mobility    Baseline unable to complete    Time 6    Period Weeks    Status New      PT LONG TERM GOAL #3   Title Patient will demo ability to complete transfers from w/c <> mat with LRAD and supervision    Baseline CGA - Min A    Time 6    Period Weeks    Status New      PT LONG TERM GOAL #4   Title Patient will be able to stand >/= 2 minutes without UE support to improve tolerance for ADLs    Baseline < 30 seconds    Time 6    Period Weeks    Status New      PT LONG TERM GOAL #5   Title Patient will be able to ambulate for >/= 100 ft with LBQC vs RW for improved household mobility    Baseline 20 ft    Time 6    Period Weeks    Status New                   Plan - 09/29/20 1100     Clinical Impression Statement Todays session focused on transfers, gait and LE strengthening and stertching, RLE extensor tone present and limiting transfer ability but not gait, patient able to transfer STS with CGA only and maintain standing for 60s.  Fatigue limited ambulation distance today but HR  and O2 sats were stable    Personal Factors and Comorbidities Age;Time since onset of injury/illness/exacerbation;Comorbidity 3+    Comorbidities HTN, HLD, GERD, DJD, OA, RA, Hearing Loss, CAD,  adenocarcinoma of right lung, Anxiety, A fib,, SDH 09/2016, chronic thrombocytopenia, left thalamic hemorrhage 07/2018, lumbar fracture December 2020    Examination-Activity Limitations Bed Mobility;Locomotion Level;Sit;Stand;Transfers    Examination-Participation Restrictions Medication Management;Community Activity;Interpersonal Relationship    Stability/Clinical Decision Making Evolving/Moderate complexity    Rehab Potential Fair    PT Frequency 2x / week    PT Duration 6 weeks    PT Treatment/Interventions ADLs/Self Care Home Management;Cryotherapy;Moist Heat;DME Instruction;Gait training;Stair training;Functional mobility training;Therapeutic activities;Therapeutic exercise;Balance training;Neuromuscular re-education;Patient/family education;Orthotic Fit/Training;Manual techniques;Wheelchair mobility training;Passive range of motion;Visual/perceptual remediation/compensation    PT Next Visit Plan Continue gait training w/ RW and hand grip attachment. Work on improving standing tolerance. Weight shifting. patient does well with targets. Bean Bag Toss    Consulted and Agree with Plan of Care Patient;Family member/caregiver    Family Member Consulted Daughter/Son             Patient will benefit from skilled therapeutic intervention in order to improve the following deficits and impairments:  Abnormal  gait, Decreased coordination, Decreased range of motion, Difficulty walking, Impaired tone, Pain, Impaired UE functional use, Decreased safety awareness, Decreased endurance, Decreased activity tolerance, Decreased knowledge of use of DME, Decreased balance, Decreased cognition, Decreased strength, Impaired sensation  Visit Diagnosis: Muscle weakness (generalized)  Other lack of coordination  Ataxia     Problem List Patient Active Problem List   Diagnosis Date Noted   Rib pain on right side 03/19/2020   Sundowning 07/04/2019   Bilateral hearing loss 04/16/2019   Urinary frequency  03/21/2019   Chronic right shoulder pain 12/19/2018   Weakness generalized 09/28/2018   Palliative care encounter 09/28/2018   Pressure injury of skin 08/27/2018   COPD (chronic obstructive pulmonary disease) ([place]) 08/26/2018   AMS (altered mental status) 08/22/2018   Altered mental status 08/21/2018   UTI (urinary tract infection) 08/21/2018   Abnormality of gait 08/20/2018   Prolonged Q-T interval on ECG    Dysphagia, post-stroke    Supplemental oxygen dependent    Anxiety state    Fatigue    SOB (shortness of breath)    Hyperglycemia    History of lung cancer    Depression    CKD (chronic kidney disease), stage III ([place])    Acute blood loss anemia    Fall 07/16/2018   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   Acute spontaneous 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 ([place]) L thalamic 07/14/2018   Cellulitis of arm, right 06/01/2018   Rash and nonspecific skin eruption 06/01/2018   Chest pain 02/02/2018   Thrombocytopenia ([place]) 02/02/2018   Hypokalemia 02/02/2018   Pulmonary embolism ([place]) 02/02/2018   Elevated troponin 02/02/2018   Pulmonary embolus ([place]) 02/02/2018   Non-ST elevation MI (NSTEMI) ([place]) 00/34/9611   Diastolic CHF ([place]) 64/35/3912   Chest pain due to CAD ([place]) 06/26/2017   Dyspnea 06/21/2017   Acute bronchitis 05/15/2017   Oral candidiasis 05/15/2017   Acute pericarditis    Chest pain at rest 12/20/2016   Angina pectoris ([place]) 12/20/2016   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   Pleurisy 10/11/2016   Chest wall pain 10/11/2016   Atrial fibrillation ([place]) 05/21/2016   Adenocarcinoma of right lung ([place]) 01/06/2016   GAD (generalized anxiety disorder) 11/20/2015   Essential hypertension    Cellulitis 09/04/2015   Cellulitis of left axilla    Esophageal ulcer without bleeding    Bilateral lower extremity edema 04/28/2014   BPH (benign prostatic hyperplasia) 10/08/2012   Anxiety     EKG abnormalities 09/05/2012   Tremor 09/05/2012   Chronic fatigue 09/05/2012   Arthritis    Asthma, chronic    Reflux esophagitis    S/P CABG (coronary artery bypass graft)    GERD (gastroesophageal reflux disease)    Hyperlipidemia    Hypertension    UNSPECIFIED PERIPHERAL VASCULAR DISEASE 06/16/2009    Lanice Shirts PT 09/29/2020, 11:11 AM  [facility] [address] [place], [place], [address] Phone: [phone]   Fax:  [phone]  Name: Evan [name] MRN: [id number] Date of Birth: 05/08/1927

## 2020-10-01 ENCOUNTER — Other Ambulatory Visit: Payer: Self-pay

## 2020-10-01 ENCOUNTER — Ambulatory Visit: Payer: Medicare HMO

## 2020-10-01 ENCOUNTER — Ambulatory Visit: Payer: Medicare HMO | Admitting: Occupational Therapy

## 2020-10-01 ENCOUNTER — Encounter: Payer: Self-pay | Admitting: Occupational Therapy

## 2020-10-01 DIAGNOSIS — R278 Other lack of coordination: Secondary | ICD-10-CM | POA: Diagnosis not present

## 2020-10-01 DIAGNOSIS — R29898 Other symptoms and signs involving the musculoskeletal system: Secondary | ICD-10-CM

## 2020-10-01 DIAGNOSIS — R262 Difficulty in walking, not elsewhere classified: Secondary | ICD-10-CM | POA: Diagnosis not present

## 2020-10-01 DIAGNOSIS — R270 Ataxia, unspecified: Secondary | ICD-10-CM | POA: Diagnosis not present

## 2020-10-01 DIAGNOSIS — R2681 Unsteadiness on feet: Secondary | ICD-10-CM | POA: Diagnosis not present

## 2020-10-01 DIAGNOSIS — M6281 Muscle weakness (generalized): Secondary | ICD-10-CM | POA: Diagnosis not present

## 2020-10-01 DIAGNOSIS — R29818 Other symptoms and signs involving the nervous system: Secondary | ICD-10-CM | POA: Diagnosis not present

## 2020-10-01 DIAGNOSIS — R2689 Other abnormalities of gait and mobility: Secondary | ICD-10-CM

## 2020-10-01 NOTE — Therapy (Signed)
[facility] [address] [place], [place], [address] Phone: [phone]   Fax:  [phone]  Physical Therapy Treatment  Patient Details  Name: Evan [name] MRN: [id number] Date of Birth: July 01, 1927 Referring Provider (PT): Delice Lesch, MD   Encounter Date: 10/01/2020   PT End of Session - 10/01/20 1021     Visit Number 8    Number of Visits 13    Date for PT Re-Evaluation 10/24/20    Authorization Type [employer] (10th Visit PN)    Authorization Time Period Awaiting Authorization Approval    PT Start Time 1017    PT Stop Time 1057    PT Time Calculation (min) 40 min    Equipment Utilized During Treatment Gait belt    Activity Tolerance Patient tolerated treatment well;Patient limited by fatigue    Behavior During Therapy [facility] for tasks assessed/performed             Past Medical History:  Diagnosis Date   Adenocarcinoma of right lung ([place]) 01/06/2016   Anginal chest pain at rest [facility])    Chronic non, controlled on Lorazepam   Anxiety    Arthritis    "all over"    BPH (benign prostatic hyperplasia)    CAD (coronary artery disease)    Chronic lower back pain    Complication of anesthesia    "problems making water afterwards"   Depression    DJD (degenerative joint disease)    Esophageal ulcer without bleeding    bid ppi indefinitely   Family history of adverse reaction to anesthesia    "children w/PONV"   GERD (gastroesophageal reflux disease)    Headache    "couple/week maybe" (09/04/2015)   Hemochromatosis    Possible, elevated iron stores, hook-like osteophytes on hand films, normal LFTs   Hepatitis    "yellow jaundice as a baby"   History of ASCVD    MULTIVESSEL   Hyperlipidemia    Hypertension    Osteoarthritis    Pneumonia    "several times; got a little now" (09/04/2015)   Rheumatoid arthritis ([place])    Subdural hematoma ([place])    small after fall 09/2016-plavix held, neurosurgery  consulted.  Asymptomatic.     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[place]) 02/2018   on Lovenox lifelong since he failed PO Eliquis    Past Surgical History:  Procedure Laterality Date   ARM SKIN LESION BIOPSY / EXCISION Left 09/02/2015   CATARACT EXTRACTION W/ INTRAOCULAR LENS  IMPLANT, BILATERAL Bilateral    CHOLECYSTECTOMY OPEN     CORNEAL TRANSPLANT Bilateral    "one at [facility]; one at [facility]"   [place] W/ STENT     DE stent ostium into the right radial free graft at [facility]-, 02-2007   ESOPHAGOGASTRODUODENOSCOPY N/A 11/09/2014   Procedure: ESOPHAGOGASTRODUODENOSCOPY (EGD);  Surgeon: [name], MD;  Location: [facility] ENDOSCOPY;  Service: Endoscopy;  Laterality: N/A;   INGUINAL HERNIA REPAIR     JOINT REPLACEMENT     LEFT HEART CATH AND CORS/GRAFTS ANGIOGRAPHY N/A 06/27/2017   Procedure: LEFT HEART CATH AND CORS/GRAFTS ANGIOGRAPHY;  Surgeon: [name], MD;  Location: [place] CV LAB;  Service: Cardiovascular;  Laterality: N/A;   NASAL SINUS SURGERY     SHOULDER OPEN ROTATOR CUFF REPAIR Right    TOTAL KNEE ARTHROPLASTY Bilateral     There were no vitals filed for this visit.  Subjective Assessment - 10/01/20 1020     Subjective Patient reports no new changes, no pain or falls. Daughter reports that the splint for the walker is going wonderful.    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 No/denies                               [facility] Adult PT Treatment/Exercise - 10/01/20 0001       Transfers    Transfers Sit to Stand;Stand to Sit;Stand Pivot Transfers    Sit to Stand 4: Min guard    Sit to Stand Details Verbal cues for technique;Tactile cues for sequencing;Tactile cues for weight shifting    Stand to Sit 4: Min guard    Stand Pivot Transfers 4: Min guard    Stand Pivot Transfer Details (indicate cue type and reason) completed from w/c <> mat without AD and CGA from PT. Cues for foot position and slow/controlled movement.    Comments completed sit <> stands from mat w/o UE support, PT providing cues to scoot forward to edge of mat to reduce extension into mat to further promote BLE strengthening. Completed 2 sets x 5 reps each. Rest break required between completion.      Ambulation/Gait   Ambulation/Gait Yes    Ambulation/Gait Assistance 4: Min guard;4: Min assist    Ambulation/Gait Assistance Details Completed ambulation with RW during session, completed ambulation x 77 ft with CGA - Min A from PT. Patient continue to  do well with a target to ambulate toward. Intermitttent cues for foot position, upright posture, and weight shift. Verbal cues and max encouramgent required for increased distance    Ambulation Distance (Feet) 77 Feet    Assistive device Rolling walker    Gait Pattern Step-to pattern;Step-through pattern;Decreased step length - right;Decreased step length - left;Narrow base of support    Ambulation Surface Level;Indoor      Neuro Re-ed    Neuro Re-ed Details  Completed standing at mat without UE support tossing bean bags with LUE to basket x 15 reps with basket positioned close, CGA. Then second set progressed position of basket further away to further challenge throw and balance. CGA required. Patient able to stand 1:30 minutes with each set      Exercises   Exercises Knee/Hip      Knee/Hip Exercises: Aerobic   Other Aerobic Completed SciFit on Level 1 x 2 minutes with BLE only, anti tippers and PT providing stabilization to w/c with completion. Then patient needed  1 minute rest break and follwoed by completion for 1 min, 15 seconds. cues for improved reciprocal motion.                      PT Short Term Goals - 09/23/20 0932       PT SHORT TERM GOAL #1   Title Patient will be independent with initial HEP with family member assistance to improve functional outcomes (All STGs Due: 09/15/20)    Baseline reports independence with assistance, progressed today during session    Time 3  Period Weeks    Status Achieved    Target Date 09/15/20      PT SHORT TERM GOAL #2   Title Patient will be able to perform 5x sit <> stand in < 45 seconds to demonstrate improvement in functional mobility and reduced risk for falls.    Baseline unable to complete, 3 reps in 22.37 secs; 22.53 secs    Time 3    Period Weeks    Status Achieved      PT SHORT TERM GOAL #3   Title Patient will be able to ambulate for >/= 50 ft with LBQC vs RW for improved household mobility    Baseline 20 ft, 50 ft with RW and CGA/Min-A    Time 3    Period Weeks    Status Achieved      PT SHORT TERM GOAL #4   Title Patient will be able to stand without UE support and close supervision for >/= 1 minute for improved tolerance for ADLs    Baseline 20-30 secs, CGA; able to stand 43.69 secs still require CGA    Time 3    Period Weeks    Status Not Met               PT Long Term Goals - 08/25/20 1334       PT LONG TERM GOAL #1   Title Patient will be independent with final HEP for strength/balance with family member assistance (All LTGs Due: 10/06/20)    Baseline HEP from prior POC    Time 6    Period Weeks    Status New    Target Date 10/06/20      PT LONG TERM GOAL #2   Title Patient will be able to complete 5x sit <> stand in </= 30 seconds with CGA to demo improved balance and functional mobility    Baseline unable to complete    Time 6    Period Weeks    Status New      PT LONG TERM GOAL #3   Title Patient will demo ability to complete transfers from  w/c <> mat with LRAD and supervision    Baseline CGA - Min A    Time 6    Period Weeks    Status New      PT LONG TERM GOAL #4   Title Patient will be able to stand >/= 2 minutes without UE support to improve tolerance for ADLs    Baseline < 30 seconds    Time 6    Period Weeks    Status New      PT LONG TERM GOAL #5   Title Patient will be able to ambulate for >/= 100 ft with LBQC vs RW for improved household mobility    Baseline 20 ft    Time 6    Period Weeks    Status New                   Plan - 10/01/20 1058     Clinical Impression Statement Continued gait training with RW, with patient able to improve distance to 77 ft today with CGA - Min A. Continued rest of session focused on NMR, strengthening, and functional transfers. patient demo improved sit <> stand with reduced assistance, but continue to require cues to obtain proper position. Continue to require intermittent rest breaks with activities.    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, lumbar fracture December 2020    Examination-Activity Limitations Bed Mobility;Locomotion Level;Sit;Stand;Transfers    Examination-Participation Restrictions Medication Management;Community Activity;Interpersonal Relationship    Stability/Clinical Decision Making Evolving/Moderate complexity    Rehab Potential Fair    PT Frequency 2x / week    PT Duration 6 weeks    PT Treatment/Interventions ADLs/Self Care Home Management;Cryotherapy;Moist Heat;DME Instruction;Gait training;Stair training;Functional mobility training;Therapeutic activities;Therapeutic exercise;Balance training;Neuromuscular re-education;Patient/family education;Orthotic Fit/Training;Manual techniques;Wheelchair mobility  training;Passive range of motion;Visual/perceptual remediation/compensation    PT Next Visit Plan Will need re-cert at end of next week. Continue gait training w/ RW and hand grip attachment. Work on improving standing tolerance. Weight shifting. patient does well with targets. Bean Bag Toss    Consulted and Agree with Plan of Care Patient;Family member/caregiver    Family Member Consulted Daughter/Son             Patient will benefit from skilled therapeutic intervention in order to improve the following deficits and impairments:  Abnormal gait, Decreased coordination, Decreased range of motion, Difficulty walking, Impaired tone, Pain, Impaired UE functional use, Decreased safety awareness, Decreased endurance, Decreased activity tolerance, Decreased knowledge of use of DME, Decreased balance, Decreased cognition, Decreased strength, Impaired sensation  Visit Diagnosis: Muscle weakness (generalized)  Difficulty in walking, not elsewhere classified  Other abnormalities of gait and mobility  Unsteadiness on feet     Problem List Patient Active Problem List   Diagnosis Date Noted   Rib pain on right side 03/19/2020   Sundowning 07/04/2019   Bilateral hearing loss 04/16/2019   Urinary frequency 03/21/2019   Chronic right shoulder pain 12/19/2018   Weakness generalized 09/28/2018   Palliative care encounter 09/28/2018   Pressure injury of skin 08/27/2018   COPD (chronic obstructive pulmonary disease) ([place]) 08/26/2018   AMS (altered mental status) 08/22/2018   Altered mental status 08/21/2018   UTI (urinary tract infection) 08/21/2018   Abnormality of gait 08/20/2018   Prolonged Q-T interval on ECG    Dysphagia, post-stroke    Supplemental oxygen dependent    Anxiety state    Fatigue    SOB (shortness of breath)    Hyperglycemia    History of lung cancer    Depression    CKD (chronic kidney disease), stage III ([place])    Acute blood loss anemia    Fall 07/16/2018    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   Thalamic hemorrhage ([place]) 07/16/2018   Acute spontaneous 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 ([place]) L thalamic 07/14/2018   Cellulitis of arm, right 06/01/2018   Rash and nonspecific skin eruption 06/01/2018   Chest pain 02/02/2018   Thrombocytopenia ([place]) 02/02/2018   Hypokalemia 02/02/2018   Pulmonary embolism ([place]) 02/02/2018   Elevated troponin 02/02/2018   Pulmonary embolus ([place]) 02/02/2018   Non-ST elevation MI (NSTEMI) ([place]) 62/37/6283   Diastolic CHF ([place]) 15/17/6160   Chest pain due to CAD ([place]) 06/26/2017   Dyspnea 06/21/2017   Acute bronchitis 05/15/2017   Oral candidiasis 05/15/2017   Acute pericarditis    Chest pain at rest 12/20/2016   Angina pectoris ([place]) 12/20/2016   Radiation pneumonitis ([place]) 11/09/2016   Pleurisy 10/11/2016   Chest wall pain 10/11/2016   Atrial fibrillation ([place]) 05/21/2016   Adenocarcinoma of right lung ([place]) 01/06/2016   GAD (generalized anxiety disorder) 11/20/2015   Essential hypertension    Cellulitis 09/04/2015   Cellulitis  of left axilla    Esophageal ulcer without bleeding    Bilateral lower extremity edema 04/28/2014   BPH (benign prostatic hyperplasia) 10/08/2012   Anxiety    EKG abnormalities 09/05/2012   Tremor 09/05/2012   Chronic fatigue 09/05/2012   Arthritis    Asthma, chronic    Reflux esophagitis    S/P CABG (coronary artery bypass graft)    GERD (gastroesophageal reflux disease)    Hyperlipidemia    Hypertension    UNSPECIFIED PERIPHERAL VASCULAR DISEASE 06/16/2009    Jones Bales, PT, DPT 10/01/2020, 11:00 AM  [facility] [address] [place], [place], [address] Phone: [phone]   Fax:  [phone]  Name: ANGUS AMINI MRN: [id number] Date of Birth: 05-Sep-1927

## 2020-10-01 NOTE — Therapy (Signed)
[facility] [address] [place], [place], [address] Phone: [phone]   Fax:  [phone]  Occupational Therapy Treatment  Patient Details  Name: Evan [name] MRN: [id number] Date of Birth: [birth year] Referring Provider (OT): Dr. Jamse Arn   Encounter Date: 10/01/2020   OT End of Session - 10/01/20 0938     Visit Number 9    Number of Visits 17    Date for OT Re-Evaluation 10/24/20    Authorization Type [employer]--awaiting auth    Authorization - Visit Number 9    Authorization - Number of Visits 10    Progress Note Due on Visit 10    OT Start Time 0935    OT Stop Time 1015    OT Time Calculation (min) 40 min    Activity Tolerance Patient tolerated treatment well    Behavior During Therapy [facility] for tasks assessed/performed             Past Medical History:  Diagnosis Date   Adenocarcinoma of right lung ([place]) 01/06/2016   Anginal chest pain at rest [facility])    Chronic non, controlled on Lorazepam   Anxiety    Arthritis    "all over"    BPH (benign prostatic hyperplasia)    CAD (coronary artery disease)    Chronic lower back pain    Complication of anesthesia    "problems making water afterwards"   Depression    DJD (degenerative joint disease)    Esophageal ulcer without bleeding    bid ppi indefinitely   Family history of adverse reaction to anesthesia    "children w/PONV"   GERD (gastroesophageal reflux disease)    Headache    "couple/week maybe" (09/04/2015)   Hemochromatosis    Possible, elevated iron stores, hook-like osteophytes on hand films, normal LFTs   Hepatitis    "yellow jaundice as a baby"   History of ASCVD    MULTIVESSEL   Hyperlipidemia    Hypertension    Osteoarthritis    Pneumonia    "several times; got a little now" (09/04/2015)   Rheumatoid arthritis ([place])    Subdural hematoma ([place])    small after fall 09/2016-plavix held, neurosurgery consulted.  Asymptomatic.      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[place]) 02/2018   on Lovenox lifelong since he failed PO Eliquis    Past Surgical History:  Procedure Laterality Date   ARM SKIN LESION BIOPSY / EXCISION Left 09/02/2015   CATARACT EXTRACTION W/ INTRAOCULAR LENS  IMPLANT, BILATERAL Bilateral    CHOLECYSTECTOMY OPEN     CORNEAL TRANSPLANT Bilateral    "one at [facility]; one at [facility]"   [place] W/ STENT     DE stent ostium into the right radial free graft at [facility]-, 02-2007   ESOPHAGOGASTRODUODENOSCOPY N/A 11/09/2014   Procedure: ESOPHAGOGASTRODUODENOSCOPY (EGD);  Surgeon: [name], MD;  Location: [facility] ENDOSCOPY;  Service: Endoscopy;  Laterality: N/A;   INGUINAL HERNIA REPAIR     JOINT REPLACEMENT     LEFT HEART CATH AND CORS/GRAFTS ANGIOGRAPHY N/A 06/27/2017   Procedure: LEFT HEART CATH AND CORS/GRAFTS ANGIOGRAPHY;  Surgeon: [name], MD;  Location: [facility];  Service: Cardiovascular;  Laterality: N/A;   NASAL SINUS SURGERY     SHOULDER OPEN ROTATOR CUFF REPAIR Right    TOTAL KNEE ARTHROPLASTY Bilateral     There were no vitals  filed for this visit.   Subjective Assessment - 10/01/20 0936     Subjective  "nothing new", "doing fine"    Patient is accompanied by: Family member    Pertinent History hx of Thalamic hemorrhage.        PMH:  HOH, CAD, RA, Hx of R RTC repair, hx of bilateral knee arthroplasty, hx of CABG, hx of aortic andurysm repair, hx of corneal transplant. hx of chronic low back pain, depression, R lung adenocarcinoma, posssible radiation (not active) pneumonitis, A fib, hx of DVT/PE--on treatment dose Lovenox, SDH 09/2016, chronic thrombocytopenia    Patient Stated Goals incr RUE functional use    Currently in Pain? No/denies               Removing cylinder/wooden pegs with mod difficulty and multiple rest breaks, unable to remove smallest (1-inch ones) using 2-point pinch, min  cueing for slow movements/normal movement patterns.  Replacing large ones with max difficulty x3 and min-mod facilitation.  Grasp/release and low range functional reaching of cones with min-mod cueing/facilitation for control/stabilization.  Cup with coban wrapped with RUE with min facilitation/stabilization and mod difficulty, then with BUEs with min difficulty.    Placing clothespins on edge of box with RUE with min-mod facilitation for stabilization and finger placement, removing with min difficulty to place in box.  Tabletop UBE for reciprocal movement x33min with multiple short rest breaks (weighted back due pt's ataxia).  Slow, controlled movements to lightly touch object without moving with min-mod difficulty/cues.    Discussed progress and plan to continue for additional 5 weeks (will need renewal at end of current POC).  Pt/dtr agrees.      OT Education - 10/01/20 1023     Education Details Continued to emphasize slow movements with RUE, relaxing RUE, and opening hand fully prior to grasp to get items fully in hand.    Person(s) Educated Patient;Child(ren)    Methods Explanation;Demonstration;Verbal cues;Tactile cues    Comprehension Verbalized understanding;Returned demonstration;Verbal cues required;Tactile cues required;Need further instruction              OT Short Term Goals - 09/29/20 0936       OT SHORT TERM GOAL #1   Title Pt/family will be independent with HEP for incr RUE functional use.--check STGs 09/24/20    Time 4    Period Weeks    Status Achieved      OT SHORT TERM GOAL #2   Title Pt will improve LUE coordination and low level functional reaching as shown by ability to grasp/release cylinder object in at least 3/5 trials.    Time 4    Period Weeks    Status On-going   09/29/20.  needs min A     OT SHORT TERM GOAL #3   Title Pt will be able to pick up 1" object using pinch at least 2/5 trials for incr coordination for ADLs.    Time 4    Period  Weeks    Status Achieved      OT SHORT TERM GOAL #4   Title --      OT SHORT TERM GOAL #5   Title --               OT Long Term Goals - 08/25/20 1513       OT LONG TERM GOAL #1   Title Pt will incorporate RUE functionally in at least 3 additional ADL tasks consistently.    Baseline currently only using to push  up/reach back for w/c and lock w/c brake with cues    Time 8    Period Weeks    Status New      OT LONG TERM GOAL #2   Title Pt will improve RUE coordination as shown by improving score on box and blocks test by at least 4.    Baseline 4 blocks.    Time 8    Period Weeks    Status New      OT LONG TERM GOAL #3   Title Pt will improve LUE coordination and functional reaching to midlevel as shown by ability to grasp/release cylinder object in at least 3/5 trials.    Time 8    Period Weeks    Status New      OT LONG TERM GOAL #4   Title Pt will be able to pick up 1" object using pinch and place in bowl at least 4/5 trials for incr coordination for ADLs.    Time 8    Period Weeks    Status New                   Plan - 10/01/20 0941     Clinical Impression Statement Pt is making slow, steady progress with improving RUE functional use and control.  Pt demo improved control with slow movements and focusing on opening hand fully prior to grasp.    OT Occupational Profile and History Detailed Assessment- Review of Records and additional review of physical, cognitive, psychosocial history related to current functional performance    Occupational performance deficits (Please refer to evaluation for details): ADL's;IADL's    Body Structure / Function / Physical Skills ADL;Decreased knowledge of use of DME;Strength;UE functional use;ROM;FMC;GMC;Dexterity;Mobility;Coordination    Rehab Potential Fair    Modification or Assistance to Complete Evaluation  Min-Moderate modification of tasks or assist with assess necessary to complete eval    OT Frequency 2x / week     OT Duration 8 weeks    OT Treatment/Interventions Moist Heat;Fluidtherapy;Self-care/ADL training;DME and/or AE instruction;Splinting;Therapeutic activities;Therapeutic exercise;Neuromuscular education;Functional Mobility Training;Passive range of motion;Patient/family education;Manual Therapy    Plan **progress note**; add to HEP; RUE functional use, emphasis on slow/controlled movements, light wt. bearing.  Discussed plan to add additional 4 weeks for OT (pt currently only scheduled for next week) due to good progress (will need renewal at end of current POC)    OT Home Exercise Plan Pt has been doing shoulder flex with cane and theraband exercises (shoulder ext, scapular retraction, horizontal abduction) per dtr--instructed to hold on theraband exercises due to decr control RUE    Family Member Consulted daughter             Patient will benefit from skilled therapeutic intervention in order to improve the following deficits and impairments:   Body Structure / Function / Physical Skills: ADL, Decreased knowledge of use of DME, Strength, UE functional use, ROM, FMC, GMC, Dexterity, Mobility, Coordination       Visit Diagnosis: Other lack of coordination  Muscle weakness (generalized)  Ataxia  Other symptoms and signs involving the nervous system  Unsteadiness on feet  Other symptoms and signs involving the musculoskeletal system    Problem List Patient Active Problem List   Diagnosis Date Noted   Rib pain on right side 03/19/2020   Sundowning 07/04/2019   Bilateral hearing loss 04/16/2019   Urinary frequency 03/21/2019   Chronic right shoulder pain 12/19/2018   Weakness generalized 09/28/2018   Palliative care  encounter 09/28/2018   Pressure injury of skin 08/27/2018   COPD (chronic obstructive pulmonary disease) ([place]) 08/26/2018   AMS (altered mental status) 08/22/2018   Altered mental status 08/21/2018   UTI (urinary tract infection) 08/21/2018   Abnormality of  gait 08/20/2018   Prolonged Q-T interval on ECG    Dysphagia, post-stroke    Supplemental oxygen dependent    Anxiety state    Fatigue    SOB (shortness of breath)    Hyperglycemia    History of lung cancer    Depression    CKD (chronic kidney disease), stage III (HCC)    Acute blood loss anemia    Fall 07/16/2018   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   Acute spontaneous 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 ([place]) L thalamic 07/14/2018   Cellulitis of arm, right 06/01/2018   Rash and nonspecific skin eruption 06/01/2018   Chest pain 02/02/2018   Thrombocytopenia ([place]) 02/02/2018   Hypokalemia 02/02/2018   Pulmonary embolism ([place]) 02/02/2018   Elevated troponin 02/02/2018   Pulmonary embolus ([place]) 02/02/2018   Non-ST elevation MI (NSTEMI) ([place]) 16/01/9603   Diastolic CHF ([place]) 54/12/8117   Chest pain due to CAD ([place]) 06/26/2017   Dyspnea 06/21/2017   Acute bronchitis 05/15/2017   Oral candidiasis 05/15/2017   Acute pericarditis    Chest pain at rest 12/20/2016   Angina pectoris ([place]) 12/20/2016   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   Pleurisy 10/11/2016   Chest wall pain 10/11/2016   Atrial fibrillation ([place]) 05/21/2016   Adenocarcinoma of right lung ([place]) 01/06/2016   GAD (generalized anxiety disorder) 11/20/2015   Essential hypertension    Cellulitis 09/04/2015   Cellulitis of left axilla    Esophageal ulcer without bleeding    Bilateral lower extremity edema 04/28/2014   BPH (benign prostatic hyperplasia) 10/08/2012   Anxiety    EKG abnormalities 09/05/2012   Tremor 09/05/2012   Chronic fatigue 09/05/2012   Arthritis    Asthma, chronic    Reflux esophagitis    S/P CABG (coronary artery bypass graft)    GERD (gastroesophageal reflux disease)    Hyperlipidemia    Hypertension    UNSPECIFIED PERIPHERAL VASCULAR DISEASE 06/16/2009    [facility] 10/01/2020, 10:43 AM  [facility] [address] [place], [place], [address] Phone: [phone]   Fax:  [phone]  Name: Evan [name] MRN: [id number] Date of Birth: 06/15/1927  Vianne Bulls, OTR/L [facility] [address] [place], [place]  [address] [phone] phone [phone] 10/01/20 10:43 AM

## 2020-10-02 DIAGNOSIS — R3914 Feeling of incomplete bladder emptying: Secondary | ICD-10-CM | POA: Diagnosis not present

## 2020-10-02 DIAGNOSIS — R3915 Urgency of urination: Secondary | ICD-10-CM | POA: Diagnosis not present

## 2020-10-02 DIAGNOSIS — R351 Nocturia: Secondary | ICD-10-CM | POA: Diagnosis not present

## 2020-10-03 ENCOUNTER — Other Ambulatory Visit: Payer: Self-pay | Admitting: Interventional Cardiology

## 2020-10-06 ENCOUNTER — Ambulatory Visit: Payer: Medicare HMO | Attending: Physical Medicine & Rehabilitation | Admitting: Physical Therapy

## 2020-10-06 ENCOUNTER — Encounter: Payer: Self-pay | Admitting: Physical Therapy

## 2020-10-06 ENCOUNTER — Other Ambulatory Visit: Payer: Self-pay

## 2020-10-06 ENCOUNTER — Encounter: Payer: Self-pay | Admitting: Occupational Therapy

## 2020-10-06 ENCOUNTER — Ambulatory Visit: Payer: Medicare HMO | Admitting: Occupational Therapy

## 2020-10-06 DIAGNOSIS — M6281 Muscle weakness (generalized): Secondary | ICD-10-CM | POA: Diagnosis not present

## 2020-10-06 DIAGNOSIS — R278 Other lack of coordination: Secondary | ICD-10-CM

## 2020-10-06 DIAGNOSIS — R29818 Other symptoms and signs involving the nervous system: Secondary | ICD-10-CM

## 2020-10-06 DIAGNOSIS — R270 Ataxia, unspecified: Secondary | ICD-10-CM | POA: Diagnosis not present

## 2020-10-06 DIAGNOSIS — R2681 Unsteadiness on feet: Secondary | ICD-10-CM

## 2020-10-06 DIAGNOSIS — R262 Difficulty in walking, not elsewhere classified: Secondary | ICD-10-CM | POA: Diagnosis not present

## 2020-10-06 DIAGNOSIS — R29898 Other symptoms and signs involving the musculoskeletal system: Secondary | ICD-10-CM | POA: Diagnosis not present

## 2020-10-06 DIAGNOSIS — R2689 Other abnormalities of gait and mobility: Secondary | ICD-10-CM | POA: Diagnosis not present

## 2020-10-06 NOTE — Telephone Encounter (Signed)
Rx(s) sent to pharmacy electronically.  

## 2020-10-06 NOTE — Therapy (Signed)
[facility] [address] [place], [place], [address] Phone: [phone]   Fax:  [phone]  Physical Therapy Treatment  Patient Details  Name: Evan [name] MRN: [id number] Date of Birth: [birth year] Referring Provider (PT): Delice Lesch, MD   Encounter Date: 10/06/2020   PT End of Session - 10/06/20 1020     Visit Number 9    Number of Visits 13    Date for PT Re-Evaluation 10/24/20    Authorization Type [employer] (10th Visit PN)    Authorization Time Period Awaiting Authorization Approval    PT Start Time 1017    PT Stop Time 1100    PT Time Calculation (min) 43 min    Equipment Utilized During Treatment Gait belt    Activity Tolerance Patient tolerated treatment well;Patient limited by fatigue    Behavior During Therapy [facility] for tasks assessed/performed             Past Medical History:  Diagnosis Date   Adenocarcinoma of right lung ([place]) 01/06/2016   Anginal chest pain at rest [facility])    Chronic non, controlled on Lorazepam   Anxiety    Arthritis    "all over"    BPH (benign prostatic hyperplasia)    CAD (coronary artery disease)    Chronic lower back pain    Complication of anesthesia    "problems making water afterwards"   Depression    DJD (degenerative joint disease)    Esophageal ulcer without bleeding    bid ppi indefinitely   Family history of adverse reaction to anesthesia    "children w/PONV"   GERD (gastroesophageal reflux disease)    Headache    "couple/week maybe" (09/04/2015)   Hemochromatosis    Possible, elevated iron stores, hook-like osteophytes on hand films, normal LFTs   Hepatitis    "yellow jaundice as a baby"   History of ASCVD    MULTIVESSEL   Hyperlipidemia    Hypertension    Osteoarthritis    Pneumonia    "several times; got a little now" (09/04/2015)   Rheumatoid arthritis ([place])    Subdural hematoma ([place])    small after fall 09/2016-plavix held, neurosurgery  consulted.  Asymptomatic.     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[place]) 02/2018   on Lovenox lifelong since he failed PO Eliquis    Past Surgical History:  Procedure Laterality Date   ARM SKIN LESION BIOPSY / EXCISION Left 09/02/2015   CATARACT EXTRACTION W/ INTRAOCULAR LENS  IMPLANT, BILATERAL Bilateral    CHOLECYSTECTOMY OPEN     CORNEAL TRANSPLANT Bilateral    "one at [facility]; one at [facility]"   [place] W/ STENT     DE stent ostium into the right radial free graft at OM-, 02-2007   ESOPHAGOGASTRODUODENOSCOPY N/A 11/09/2014   Procedure: ESOPHAGOGASTRODUODENOSCOPY (EGD);  Surgeon: [name], MD;  Location: [facility] ENDOSCOPY;  Service: Endoscopy;  Laterality: N/A;   INGUINAL HERNIA REPAIR     JOINT REPLACEMENT     LEFT HEART CATH AND CORS/GRAFTS ANGIOGRAPHY N/A 06/27/2017   Procedure: LEFT HEART CATH AND CORS/GRAFTS ANGIOGRAPHY;  Surgeon: [name], MD;  Location: [facility] CV LAB;  Service: Cardiovascular;  Laterality: N/A;   NASAL SINUS SURGERY     SHOULDER OPEN ROTATOR CUFF REPAIR Right    TOTAL KNEE ARTHROPLASTY Bilateral     There were no vitals filed for this visit.  Subjective Assessment - 10/06/20 1020     Subjective No new complaints. No falls or pain to report. Pt states ex's at home "are going".    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 No/denies                       [facility] Adult PT Treatment/Exercise - 10/06/20 1021       Transfers   Transfers Sit to Stand;Stand to [employer]     Sit to Stand 4: Min guard;With upper extremity assist;From bed;From chair/3-in-1    Stand to Sit 4: Min guard;With upper extremity assist;To bed;To chair/3-in-1    Stand Pivot Transfers 4: Min guard    Stand Pivot Transfer Details (indicate cue type and reason) with use of RW/hand orthotic from wheelchair to mat table,      Ambulation/Gait   Ambulation/Gait Assistance 4: Min guard;4: Min assist    Ambulation/Gait Assistance Details continued with use of target for pt to walk towards. assist with cues/facilitation for posture, step length and walker management.    Ambulation Distance (Feet) 62 Feet   x1, 76 x1 (encouragement needed to keep going with this rep)   Assistive device Rolling walker    Gait Pattern Step-to pattern;Step-through pattern;Decreased step length - right;Decreased step length - left;Narrow base of support    Ambulation Surface Level;Indoor      Neuro Re-ed    Neuro Re-ed Details  for balance/activity tolerance: standing next to mat table with no UE support engaged in the following activities- connect 4 and bean bag tossing for total of 4 reps standing for ~ 1 minute in duration each time. min guard assist for balance with cues on posture/weight shifting.      Knee/Hip Exercises: Aerobic   Other Aerobic LE's only from wheelchair with PTA stabilizing wheelchair along with anti tippers on level 10 for 2 minutes work, 1 min rest, then 2 additional minutes work                      PT Short Term Goals - 09/23/20 0932       PT [place] #1   Title Patient will be independent with initial HEP with family member assistance to improve functional outcomes (All STGs Due: 09/15/20)    Baseline reports independence with assistance, progressed today during session    Time 3    Period Weeks    Status Achieved    Target Date 09/15/20      PT SHORT TERM GOAL #2   Title Patient will be able to perform 5x sit <> stand in < 45 seconds to demonstrate improvement in  functional mobility and reduced risk for falls.    Baseline unable to complete, 3 reps in 22.37 secs; 22.53 secs    Time 3    Period Weeks    Status Achieved      PT SHORT TERM GOAL #3   Title Patient will be able to ambulate for >/= 50 ft with LBQC vs RW for improved household mobility    Baseline 20 ft, 50 ft with RW  and CGA/Min-A    Time 3    Period Weeks    Status Achieved      PT SHORT TERM GOAL #4   Title Patient will be able to stand without UE support and close supervision for >/= 1 minute for improved tolerance for ADLs    Baseline 20-30 secs, CGA; able to stand 43.69 secs still require CGA    Time 3    Period Weeks    Status Not Met               PT Long Term Goals - 08/25/20 1334       PT LONG TERM GOAL #1   Title Patient will be independent with final HEP for strength/balance with family member assistance (All LTGs Due: 10/06/20)    Baseline HEP from prior POC    Time 6    Period Weeks    Status New    Target Date 10/06/20      PT LONG TERM GOAL #2   Title Patient will be able to complete 5x sit <> stand in </= 30 seconds with CGA to demo improved balance and functional mobility    Baseline unable to complete    Time 6    Period Weeks    Status New      PT LONG TERM GOAL #3   Title Patient will demo ability to complete transfers from w/c <> mat with LRAD and supervision    Baseline CGA - Min A    Time 6    Period Weeks    Status New      PT LONG TERM GOAL #4   Title Patient will be able to stand >/= 2 minutes without UE support to improve tolerance for ADLs    Baseline < 30 seconds    Time 6    Period Weeks    Status New      PT LONG TERM GOAL #5   Title Patient will be able to ambulate for >/= 100 ft with LBQC vs RW for improved household mobility    Baseline 20 ft    Time 6    Period Weeks    Status New                   Plan - 10/06/20 1021     Clinical Impression Statement Today's skilled session continued to focus on  strengthening, gait training and standing balance with rest breaks taken as needed. The pt is progressing and should benefit from continued PT to progress toward unmet goals.    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, lumbar fracture December 2020    Examination-Activity Limitations Bed Mobility;Locomotion Level;Sit;Stand;Transfers    Examination-Participation Restrictions Medication Management;Community Activity;Interpersonal Relationship    Stability/Clinical Decision Making Evolving/Moderate complexity    Rehab Potential Fair    PT Frequency 2x / week    PT Duration 6 weeks    PT Treatment/Interventions ADLs/Self Care Home Management;Cryotherapy;Moist Heat;DME Instruction;Gait training;Stair training;Functional mobility training;Therapeutic activities;Therapeutic exercise;Balance training;Neuromuscular re-education;Patient/family education;Orthotic Fit/Training;Manual techniques;Wheelchair mobility training;Passive range of motion;Visual/perceptual remediation/compensation    PT Next Visit Plan check goals for recert at next visit. Continue gait training w/ RW and hand grip attachment. Work on improving standing tolerance. Weight shifting. patient does well with targets. Bean Bag Toss  Consulted and Agree with Plan of Care Patient;Family member/caregiver    Family Member Consulted Daughter/Son             Patient will benefit from skilled therapeutic intervention in order to improve the following deficits and impairments:  Abnormal gait, Decreased coordination, Decreased range of motion, Difficulty walking, Impaired tone, Pain, Impaired UE functional use, Decreased safety awareness, Decreased endurance, Decreased activity tolerance, Decreased  knowledge of use of DME, Decreased balance, Decreased cognition, Decreased strength, Impaired sensation  Visit Diagnosis: Muscle weakness (generalized)  Unsteadiness on feet  Difficulty in walking, not elsewhere classified  Other abnormalities of gait and mobility     Problem List Patient Active Problem List   Diagnosis Date Noted   Rib pain on right side 03/19/2020   Sundowning 07/04/2019   Bilateral hearing loss 04/16/2019   Urinary frequency 03/21/2019   Chronic right shoulder pain 12/19/2018   Weakness generalized 09/28/2018   Palliative care encounter 09/28/2018   Pressure injury of skin 08/27/2018   COPD (chronic obstructive pulmonary disease) ([place]) 08/26/2018   AMS (altered mental status) 08/22/2018   Altered mental status 08/21/2018   UTI (urinary tract infection) 08/21/2018   Abnormality of gait 08/20/2018   Prolonged Q-T interval on ECG    Dysphagia, post-stroke    Supplemental oxygen dependent    Anxiety state    Fatigue    SOB (shortness of breath)    Hyperglycemia    History of lung cancer    Depression    CKD (chronic kidney disease), stage III ([place])    Acute blood loss anemia    Fall 07/16/2018   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   Pleurisy 10/11/2016   Chest wall pain 10/11/2016   Atrial fibrillation ([place]) 05/21/2016   Adenocarcinoma of right lung ([place]) 01/06/2016   GAD (generalized anxiety disorder) 11/20/2015   Essential hypertension    Cellulitis 09/04/2015   Cellulitis of left axilla    Esophageal ulcer without bleeding    Bilateral lower extremity edema 04/28/2014   BPH (benign prostatic hyperplasia) 10/08/2012   Anxiety    EKG abnormalities 09/05/2012   Tremor 09/05/2012   Chronic fatigue 09/05/2012   Arthritis    Asthma, chronic    Reflux esophagitis    S/P CABG (coronary artery bypass graft)    GERD (gastroesophageal reflux disease)    Hyperlipidemia    Hypertension    UNSPECIFIED PERIPHERAL VASCULAR DISEASE 06/16/2009   Willow Ora, PTA, [facility] [address] [place], [place] [address] [phone] 10/07/20, 9:30 AM   Name: NADIM MALIA MRN: [id number] Date of Birth: 1927/11/10

## 2020-10-06 NOTE — Therapy (Signed)
[facility] [address] [place], [place], [address] Phone: [phone]   Fax:  [phone]  Occupational Therapy Treatment  Patient Details  Name: Evan [name] MRN: [id number] Date of Birth: 1927/09/21 Referring Provider (OT): Dr. Jamse Arn   Encounter Date: 10/06/2020   OT End of Session - 10/06/20 0937     Visit Number 10    Number of Visits 17    Date for OT Re-Evaluation 10/24/20    Authorization Type [employer] [place] - Visit Number 10    Authorization - Number of Visits 10    Progress Note Due on Visit 2    OT Start Time 0935    OT Stop Time 1015    OT Time Calculation (min) 40 min    Activity Tolerance Patient tolerated treatment well    Behavior During Therapy [facility] for tasks assessed/performed             Past Medical History:  Diagnosis Date   Adenocarcinoma of right lung ([place]) 01/06/2016   Anginal chest pain at rest [facility])    Chronic non, controlled on Lorazepam   Anxiety    Arthritis    "all over"    BPH (benign prostatic hyperplasia)    CAD (coronary artery disease)    Chronic lower back pain    Complication of anesthesia    "problems making water afterwards"   Depression    DJD (degenerative joint disease)    Esophageal ulcer without bleeding    bid ppi indefinitely   Family history of adverse reaction to anesthesia    "children w/PONV"   GERD (gastroesophageal reflux disease)    Headache    "couple/week maybe" (09/04/2015)   Hemochromatosis    Possible, elevated iron stores, hook-like osteophytes on hand films, normal LFTs   Hepatitis    "yellow jaundice as a baby"   History of ASCVD    MULTIVESSEL   Hyperlipidemia    Hypertension    Osteoarthritis    Pneumonia    "several times; got a little now" (09/04/2015)   Rheumatoid arthritis ([place])    Subdural hematoma ([place])    small after fall 09/2016-plavix held, neurosurgery consulted.  Asymptomatic.      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[place]) 02/2018   on Lovenox lifelong since he failed PO Eliquis    Past Surgical History:  Procedure Laterality Date   ARM SKIN LESION BIOPSY / EXCISION Left 09/02/2015   CATARACT EXTRACTION W/ INTRAOCULAR LENS  IMPLANT, BILATERAL Bilateral    CHOLECYSTECTOMY OPEN     CORNEAL TRANSPLANT Bilateral    "one at [facility]; one at [facility]"   [place] W/ STENT     DE stent ostium into the right radial free graft at OM-, 02-2007   ESOPHAGOGASTRODUODENOSCOPY N/A 11/09/2014   Procedure: ESOPHAGOGASTRODUODENOSCOPY (EGD);  Surgeon: [name], MD;  Location: [facility] END[facility]COPY;  Service: Endoscopy;  Laterality: N/A;   INGUINAL HERNIA REPAIR     JOINT REPLACEMENT     LEFT HEART CATH AND CORS/GRAFTS ANGIOGRAPHY N/A 06/27/2017   Procedure: LEFT HEART CATH AND CORS/GRAFTS ANGIOGRAPHY;  Surgeon: [name], MD;  Location: [facility] CV LAB;  Service: Cardiovascular;  Laterality: N/A;   NASAL SINUS SURGERY     SHOULDER OPEN ROTATOR CUFF REPAIR Right    TOTAL KNEE ARTHROPLASTY Bilateral     There were no vitals  filed for this visit.   Subjective Assessment - 10/06/20 0937     Subjective  still working at home, "it's getting better" (dtr)    Patient is accompanied by: Family member    Pertinent History hx of Thalamic hemorrhage.        PMH:  HOH, CAD, RA, Hx of R RTC repair, hx of bilateral knee arthroplasty, hx of CABG, hx of aortic andurysm repair, hx of corneal transplant. hx of chronic low back pain, depression, R lung adenocarcinoma, posssible radiation (not active) pneumonitis, A fib, hx of DVT/PE--on treatment dose Lovenox, SDH 09/2016, chronic thrombocytopenia    Patient Stated Goals incr RUE functional use    Currently in Pain? No/denies                Grasp/release and low range functional reaching of cones with min-mod cueing/facilitation for control/stabilization  (coban wrapped)  Picking up 1-inch blocks using 2-point pinch and placing in container with min-mod cues/min facilitation for slow/controlled movements and normal movement patterns.    Light wt. Bearing through elbow and hand with body on arm movements with noted decr in RUE ataxia/tremors with min-mod cueing./facilitation for normal movement patterns.    Table slides RUE for shoulder flex/elbow ext, and horizontal abduction/adduction with min facilitation/cues.  Using RUE as stabilizer while zipping/unzipping zipper with min-mod facilitation/cues, decr facilitation with repetition.  Attempted to use RUE as stabilizer while attempting to fasten large buttons with max difficulty with mod facilitation.  Focused on positioning of RUE in neutral shoulder position at rest with min cues/facilitation.  Noted pt with improved positioning/decr guarding/flexor pattern at rest with decr facilitation.          OT Short Term Goals - 09/29/20 0936       OT SHORT TERM GOAL #1   Title Pt/family will be independent with HEP for incr RUE functional use.--check STGs 09/24/20    Time 4    Period Weeks    Status Achieved      OT SHORT TERM GOAL #2   Title Pt will improve LUE coordination and low level functional reaching as shown by ability to grasp/release cylinder object in at least 3/5 trials.    Time 4    Period Weeks    Status On-going   09/29/20.  needs min A     OT SHORT TERM GOAL #3   Title Pt will be able to pick up 1" object using pinch at least 2/5 trials for incr coordination for ADLs.    Time 4    Period Weeks    Status Achieved      OT SHORT TERM GOAL #4   Title --      OT SHORT TERM GOAL #5   Title --               OT Long Term Goals - 08/25/20 1513       OT LONG TERM GOAL #1   Title Pt will incorporate RUE functionally in at least 3 additional ADL tasks consistently.    Baseline currently only using to push up/reach back for w/c and lock w/c brake with cues     Time 8    Period Weeks    Status New      OT LONG TERM GOAL #2   Title Pt will improve RUE coordination as shown by improving score on box and blocks test by at least 4.    Baseline 4 blocks.    Time 8  Period Weeks    Status New      OT LONG TERM GOAL #3   Title Pt will improve LUE coordination and functional reaching to midlevel as shown by ability to grasp/release cylinder object in at least 3/5 trials.    Time 8    Period Weeks    Status New      OT LONG TERM GOAL #4   Title Pt will be able to pick up 1" object using pinch and place in bowl at least 4/5 trials for incr coordination for ADLs.    Time 8    Period Weeks    Status New                   Plan - 10/06/20 0973     Clinical Impression Statement Progress Note Reporting Period  08/25/20-10/06/20:  Pt is making slow, steady progress with improving RUE functional use and control.  Pt demo improved control with slow movements and focusing on opening hand fully prior to grasp.  Pt demo improved RUE positioning at rest and improving endurance.  Pt would benefit from continued occupational therapy to maximize RUE functional use.    OT Occupational Profile and History Detailed Assessment- Review of Records and additional review of physical, cognitive, psychosocial history related to current functional performance    Occupational performance deficits (Please refer to evaluation for details): ADL's;IADL's    Body Structure / Function / Physical Skills ADL;Decreased knowledge of use of DME;Strength;UE functional use;ROM;FMC;GMC;Dexterity;Mobility;Coordination    Rehab Potential Fair    Modification or Assistance to Complete Evaluation  Min-Moderate modification of tasks or assist with assess necessary to complete eval    OT Frequency 2x / week    OT Duration 8 weeks    OT Treatment/Interventions Moist Heat;Fluidtherapy;Self-care/ADL training;DME and/or AE instruction;Splinting;Therapeutic activities;Therapeutic  exercise;Neuromuscular education;Functional Mobility Training;Passive range of motion;Patient/family education;Manual Therapy    Plan ? add to HEP; RUE functional use, emphasis on slow/controlled movements, light wt. bearing. plan to add additional 4 weeks for OT (pt currently only scheduled for next week) due to good progress (will need renewal at end of current POC)    OT Home Exercise Plan Pt has been doing shoulder flex with cane and theraband exercises (shoulder ext, scapular retraction, horizontal abduction) per dtr--instructed to hold on theraband exercises due to decr control RUE    Family Member Consulted daughter             Patient will benefit from skilled therapeutic intervention in order to improve the following deficits and impairments:   Body Structure / Function / Physical Skills: ADL, Decreased knowledge of use of DME, Strength, UE functional use, ROM, FMC, GMC, Dexterity, Mobility, Coordination       Visit Diagnosis: Ataxia  Other symptoms and signs involving the nervous system  Other lack of coordination  Other symptoms and signs involving the musculoskeletal system  Muscle weakness (generalized)    Problem List Patient Active Problem List   Diagnosis Date Noted   Rib pain on right side 03/19/2020   Sundowning 07/04/2019   Bilateral hearing loss 04/16/2019   Urinary frequency 03/21/2019   Chronic right shoulder pain 12/19/2018   Weakness generalized 09/28/2018   Palliative care encounter 09/28/2018   Pressure injury of skin 08/27/2018   COPD (chronic obstructive pulmonary disease) ([place]) 08/26/2018   AMS (altered mental status) 08/22/2018   Altered mental status 08/21/2018   UTI (urinary tract infection) 08/21/2018   Abnormality of gait 08/20/2018   Prolonged  Q-T interval on ECG    Dysphagia, post-stroke    Supplemental oxygen dependent    Anxiety state    Fatigue    SOB (shortness of breath)    Hyperglycemia    History of lung cancer     Depression    CKD (chronic kidney disease), stage III ([facility])    Acute blood loss anemia    Fall 07/16/2018   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   Acute spontaneous 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 ([place]) L thalamic 07/14/2018   Cellulitis of arm, right 06/01/2018   Rash and nonspecific skin eruption 06/01/2018   Chest pain 02/02/2018   Thrombocytopenia ([place]) 02/02/2018   Hypokalemia 02/02/2018   Pulmonary embolism ([place]) 02/02/2018   Elevated troponin 02/02/2018   Pulmonary embolus ([place]) 02/02/2018   Non-ST elevation MI (NSTEMI) ([place]) 01/26/8526   Diastolic CHF ([place]) 78/24/2353   Chest pain due to CAD ([place]) 06/26/2017   Dyspnea 06/21/2017   Acute bronchitis 05/15/2017   Oral candidiasis 05/15/2017   Acute pericarditis    Chest pain at rest 12/20/2016   Angina pectoris ([place]) 12/20/2016   Radiation pneumonitis ([place]) 11/09/2016   Pleurisy 10/11/2016   Chest wall pain 10/11/2016   Atrial fibrillation ([place]) 05/21/2016   Adenocarcinoma of right lung ([place]) 01/06/2016   GAD (generalized anxiety disorder) 11/20/2015   Essential hypertension    Cellulitis 09/04/2015   Cellulitis of left axilla    Esophageal ulcer without bleeding    Bilateral lower extremity edema 04/28/2014   BPH (benign prostatic hyperplasia) 10/08/2012   Anxiety    EKG abnormalities 09/05/2012   Tremor 09/05/2012   Chronic fatigue 09/05/2012   Arthritis    Asthma, chronic    Reflux esophagitis    S/P CABG (coronary artery bypass graft)    GERD (gastroesophageal reflux disease)    Hyperlipidemia    Hypertension    UNSPECIFIED PERIPHERAL VASCULAR DISEASE 06/16/2009    [facility] 10/06/2020, 11:21 AM  [facility] [address] [place], [place], [address] Phone: [phone]   Fax:  [phone]  Name: Evan [name] MRN: [id number] Date of Birth: 01-Feb-1928  Vianne Bulls, OTR/L [facility] [address] [place], [place]  [address] [phone] phone [phone] 10/06/20 11:34 AM

## 2020-10-08 ENCOUNTER — Ambulatory Visit: Payer: Medicare HMO

## 2020-10-08 ENCOUNTER — Ambulatory Visit: Payer: Medicare HMO | Admitting: Occupational Therapy

## 2020-10-08 ENCOUNTER — Other Ambulatory Visit: Payer: Self-pay

## 2020-10-08 DIAGNOSIS — R2681 Unsteadiness on feet: Secondary | ICD-10-CM

## 2020-10-08 DIAGNOSIS — R278 Other lack of coordination: Secondary | ICD-10-CM

## 2020-10-08 DIAGNOSIS — R270 Ataxia, unspecified: Secondary | ICD-10-CM

## 2020-10-08 DIAGNOSIS — R262 Difficulty in walking, not elsewhere classified: Secondary | ICD-10-CM | POA: Diagnosis not present

## 2020-10-08 DIAGNOSIS — M6281 Muscle weakness (generalized): Secondary | ICD-10-CM | POA: Diagnosis not present

## 2020-10-08 DIAGNOSIS — R29818 Other symptoms and signs involving the nervous system: Secondary | ICD-10-CM | POA: Diagnosis not present

## 2020-10-08 DIAGNOSIS — R29898 Other symptoms and signs involving the musculoskeletal system: Secondary | ICD-10-CM | POA: Diagnosis not present

## 2020-10-08 DIAGNOSIS — R2689 Other abnormalities of gait and mobility: Secondary | ICD-10-CM

## 2020-10-08 NOTE — Patient Instructions (Addendum)
    Set bottle on table at different locations.  Slowly slide hand along table and try to lightly touch bottles 1 at a time without moving it.  When picking up/setting down bottle:  focus on moving slow, open hand fully to get bottle in your hand, and make sure the bottom is on the table fully before opening slowly.  --Try full bottle with lid closed.  Try putting on deodorant with right hand.  Try drinking with both hands.  Try holding jacket with right hand while zipping with left hand.  Try to clap with both hands--try to beat or match someone else.   SITTING: Reach Across Body    Weight bear on right hand (on someone's knee beside you with their hand on top). Reach across body with other hand to reach target.   _10-15__ reps per set (take breaks as needed), _1-2__ sets per day

## 2020-10-08 NOTE — Therapy (Signed)
[facility] [address] [place], [place], [address] Phone: [phone]   Fax:  [phone]  Physical Therapy Treatment/Progress Note/Re-Certification Patient Details  Name: Evan [name] MRN: [id number] Date of Birth: Oct 19, 1927 Referring Provider (PT): Delice Lesch, MD  Physical Therapy Progress Note   Dates of Reporting Period: 08/25/2020 - 10/08/2020  See Note below for Objective Data and Assessment of Progress/Goals.  Thank you for the referral of this patient. Guillermina [place], PT, DPT   Encounter Date: 10/08/2020   PT End of Session - 10/08/20 1021     Visit Number 10    Number of Visits 18    Date for PT Re-Evaluation 11/13/20    Authorization Type [employer] (10th Visit PN)    Authorization Time Period Awaiting Authorization Approval    PT Start Time 1017    PT Stop Time 1059    PT Time Calculation (min) 42 min    Equipment Utilized During Treatment Gait belt    Activity Tolerance Patient tolerated treatment well;Patient limited by fatigue    Behavior During Therapy WFL for tasks assessed/performed             Past Medical History:  Diagnosis Date   Adenocarcinoma of right lung ([place]) 01/06/2016   Anginal chest pain at rest [facility])    Chronic non, controlled on Lorazepam   Anxiety    Arthritis    "all over"    BPH (benign prostatic hyperplasia)    CAD (coronary artery disease)    Chronic lower back pain    Complication of anesthesia    "problems making water afterwards"   Depression    DJD (degenerative joint disease)    Esophageal ulcer without bleeding    bid ppi indefinitely   Family history of adverse reaction to anesthesia    "children w/PONV"   GERD (gastroesophageal reflux disease)    Headache    "couple/week maybe" (09/04/2015)   Hemochromatosis    Possible, elevated iron stores, hook-like osteophytes on hand films, normal LFTs   Hepatitis    "yellow jaundice as a baby"   History of  ASCVD    MULTIVESSEL   Hyperlipidemia    Hypertension    Osteoarthritis    Pneumonia    "several times; got a little now" (09/04/2015)   Rheumatoid arthritis ([place])    Subdural hematoma ([place])    small after fall 09/2016-plavix held, neurosurgery consulted.  Asymptomatic.     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[place]) 02/2018   on Lovenox lifelong since he failed PO Eliquis    Past Surgical History:  Procedure Laterality Date   ARM SKIN LESION BIOPSY / EXCISION Left 09/02/2015   CATARACT EXTRACTION W/ INTRAOCULAR LENS  IMPLANT, BILATERAL Bilateral    CHOLECYSTECTOMY OPEN     CORNEAL TRANSPLANT Bilateral    "one at [facility]; one at [facility]"   [place] W/ STENT     DE stent ostium into the right radial free graft at [facility]-, 02-2007   ESOPHAGOGASTRODUODENOSCOPY N/A 11/09/2014   Procedure: ESOPHAGOGASTRODUODENOSCOPY (EGD);  Surgeon: [name], MD;  Location: [facility] ENDOSCOPY;  Service: Endoscopy;  Laterality: N/A;   INGUINAL HERNIA REPAIR     JOINT REPLACEMENT     LEFT HEART CATH AND CORS/GRAFTS ANGIOGRAPHY N/A 06/27/2017   Procedure: LEFT HEART CATH AND CORS/GRAFTS ANGIOGRAPHY;  Surgeon: [name], MD;  Location: [place] [facility];  Service:  Cardiovascular;  Laterality: N/A;   NASAL SINUS SURGERY     SHOULDER OPEN ROTATOR CUFF REPAIR Right    TOTAL KNEE ARTHROPLASTY Bilateral     There were no vitals filed for this visit.   Subjective Assessment - 10/08/20 1021     Subjective No new changes/complaints. No falls. No pain.    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 No/denies                [facility] PT Assessment - 10/08/20 0001       Assessment   Medical Diagnosis Thalamic hemorrhage    Referring Provider (PT) Delice Lesch, MD                  [facility] Adult PT Treatment/Exercise - 10/08/20 0001       Transfers   Transfers Sit to Stand;Stand to Sit;Stand Pivot Transfers    Sit to Stand 4: Min guard;With upper extremity assist;From bed;From chair/3-in-1    Sit to Stand Details Verbal cues for technique;Tactile cues for sequencing;Tactile cues for weight shifting    Five time sit to stand comments  unable to complete 5 reps today due to fatigue, able to complete 3 reps in 16 seconds. increased tone noted in RLE. 22.53 secs at last assesment    Stand to Sit 4: Min guard;With upper extremity assist;To bed;To chair/3-in-1    Stand Pivot Transfers 4: Min guard    Stand Pivot Transfer Details (indicate cue type and reason) completed stand pivot transfer with RW and R Hand Grip Attachment from mat <> chair, CGA required. Pt require assistance placing RUE into hand grip but able to get hand out supervision. Completed x 2 reps, CGA and verbal cues required for proper completion.      Ambulation/Gait   Ambulation/Gait Yes    Ambulation/Gait Assistance 4: Min guard;4: Min assist    Ambulation/Gait Assistance Details Continued gait training with RW and use  of target for pt to walk towards. PT providing assist with cues/facilitation for posture, step length and walker management. Increased fatigue noted iwth ambulation today, due to completing at end of session.    Ambulation Distance (Feet) 70 Feet    Assistive device Rolling walker   with R AFO and R Hand Orthotic   Gait Pattern Step-to pattern;Step-through pattern;Decreased step length - right;Decreased step length - left;Narrow base of support    Ambulation Surface Level;Indoor      Neuro Re-ed    Neuro Re-ed Details  for balance/activity tolerance: standing  at mat table with no UE support engaged in  connect 4, patient able to stand 1 minute, 38 seconds. min guard assist for balance with cues on posture/weight shifting. Second rep of standing, patient able to stand x 1 min, 30 seconds.      Exercises   Exercises Other Exercises    Other Exercises  Completed verbal review of HEP.                    PT Education - 10/08/20 1023     Education Details progress toward LTG's    Person(s) Educated Patient;Child(ren)  Methods Explanation;Demonstration    Comprehension Verbalized understanding              PT Short Term Goals - 09/23/20 0932       PT SHORT TERM GOAL #1   Title Patient will be independent with initial HEP with family member assistance to improve functional outcomes (All STGs Due: 09/15/20)    Baseline reports independence with assistance, progressed today during session    Time 3    Period Weeks    Status Achieved    Target Date 09/15/20      PT SHORT TERM GOAL #2   Title Patient will be able to perform 5x sit <> stand in < 45 seconds to demonstrate improvement in functional mobility and reduced risk for falls.    Baseline unable to complete, 3 reps in 22.37 secs; 22.53 secs    Time 3    Period Weeks    Status Achieved      PT SHORT TERM GOAL #3   Title Patient will be able to ambulate for >/= 50 ft with LBQC vs RW for improved household mobility    Baseline 20 ft, 50 ft with RW and CGA/Min-A    Time 3    Period Weeks    Status Achieved      PT SHORT TERM GOAL #4   Title Patient will be able to stand without UE support and close supervision for >/= 1 minute for improved tolerance for ADLs    Baseline 20-30 secs, CGA; able to stand 43.69 secs still require CGA    Time 3    Period Weeks    Status Not Met               PT Long Term Goals - 10/08/20 1031       PT LONG TERM GOAL #1   Title Patient will be independent with final HEP for strength/balance with family member assistance (All LTGs  Due: 10/06/20)    Baseline daughter/paitent reports independence with HEP, completing daily.    Time 6    Period Weeks    Status Achieved      PT LONG TERM GOAL #2   Title Patient will be able to complete 5x sit <> stand in </= 30 seconds with CGA to demo improved balance and functional mobility    Baseline unable to complete; 22.53 seconds on 6/22    Time 6    Period Weeks    Status Achieved      PT LONG TERM GOAL #3   Title Patient will demo ability to complete transfers from w/c <> mat with LRAD and supervision    Baseline CGA - Min A; CGA required    Time 6    Period Weeks    Status Not Met      PT LONG TERM GOAL #4   Title Patient will be able to stand >/= 2 minutes without UE support to improve tolerance for ADLs    Baseline < 30 seconds; 1 minute, 38 seconds on 7/7    Time 6    Period Weeks    Status Not Met      PT LONG TERM GOAL #5   Title Patient will be able to ambulate for >/= 100 ft with LBQC vs RW for improved household mobility    Baseline 20 ft; 70 ft    Time 6    Period Weeks    Status Not Met  Updated Short Term Goals:   PT Short Term Goals - 10/08/20 1342       PT SHORT TERM GOAL #1   Title = LTGs            Updated Long Term Goals:    PT Long Term Goals - 10/08/20 1342       PT LONG TERM GOAL #1   Title Patient will be independent with final HEP for strength/balance with family member assistance (All LTGs Due: 11/13/20)    Baseline daughter/paitent reports independence with HEP, completing daily. will continue to benefit from progressive HEP    Time 4    Period Weeks    Status Revised      PT LONG TERM GOAL #2   Title Patient will be able to complete 5x sit <> stand in </= 20 seconds with CGA to demo improved balance and functional mobility    Baseline unable to complete; 22.53 seconds on 6/22    Time 4    Period Weeks    Status Revised      PT LONG TERM GOAL #3   Title Patient will demo ability to complete  transfers from w/c <> mat with RW and supervision    Baseline CGA - Min A; CGA required with RW    Time 4    Period Weeks    Status Revised      PT LONG TERM GOAL #4   Title Patient will be able to stand >/= 2 minutes without UE support to improve tolerance for ADLs    Baseline < 30 seconds; 1 minute, 38 seconds on 7/7    Time 4    Period Weeks    Status On-going      PT LONG TERM GOAL #5   Title Patient will be able to ambulate for >/= 100 ft with LBQC vs RW for improved household mobility    Baseline 20 ft; 70 ft    Time 4    Period Weeks    Status On-going                Plan - 10/08/20 1048     Clinical Impression Statement Today's skilled PT session focused on assesment of patient's progress toward LTG. Patient able to meet LTG #1 and 2 today during session, as well as demosntrate progress toward all other goals but unable to meet goal level at this time. Patient is making slow steady progress with PT services, dmeonstrating improved standing tolerance, ambulation distance and balance for functional tasks. Patient will continue to benefit from skilled PT services to progress toward all unmet goals.    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, lumbar fracture December 2020    Examination-Activity Limitations Bed Mobility;Locomotion Level;Sit;Stand;Transfers    Examination-Participation Restrictions Medication Management;Community Activity;Interpersonal Relationship    Stability/Clinical Decision Making Evolving/Moderate complexity    Rehab Potential Fair    PT Frequency 2x / week    PT Duration 4 weeks    PT Treatment/Interventions ADLs/Self Care Home Management;Cryotherapy;Moist Heat;DME Instruction;Gait training;Stair  training;Functional mobility training;Therapeutic activities;Therapeutic exercise;Balance training;Neuromuscular re-education;Patient/family education;Orthotic Fit/Training;Manual techniques;Wheelchair mobility training;Passive range of motion;Visual/perceptual remediation/compensation    PT Next Visit Plan Continue gait training w/ RW and hand grip attachment. Work on improving standing tolerance. Weight shifting. patient does well with targets. Bean Bag  Toss    Consulted and Agree with Plan of Care Patient;Family member/caregiver    Family Member Consulted Daughter/Son             Patient will benefit from skilled therapeutic intervention in order to improve the following deficits and impairments:  Abnormal gait, Decreased coordination, Decreased range of motion, Difficulty walking, Impaired tone, Pain, Impaired UE functional use, Decreased safety awareness, Decreased endurance, Decreased activity tolerance, Decreased knowledge of use of DME, Decreased balance, Decreased cognition, Decreased strength, Impaired sensation  Visit Diagnosis: Difficulty in walking, not elsewhere classified  Other abnormalities of gait and mobility  Muscle weakness (generalized)  Unsteadiness on feet     Problem List Patient Active Problem List   Diagnosis Date Noted   Rib pain on right side 03/19/2020   Sundowning 07/04/2019   Bilateral hearing loss 04/16/2019   Urinary frequency 03/21/2019   Chronic right shoulder pain 12/19/2018   Weakness generalized 09/28/2018   Palliative care encounter 09/28/2018   Pressure injury of skin 08/27/2018   COPD (chronic obstructive pulmonary disease) ([place]) 08/26/2018   AMS (altered mental status) 08/22/2018   Altered mental status 08/21/2018   UTI (urinary tract infection) 08/21/2018   Abnormality of gait 08/20/2018   Prolonged Q-T interval on ECG    Dysphagia, post-stroke    Supplemental oxygen dependent    Anxiety state    Fatigue    SOB (shortness of  breath)    Hyperglycemia    History of lung cancer    Depression    CKD (chronic kidney disease), stage III ([place])    Acute blood loss anemia    Fall 07/16/2018   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   Thalamic hemorrhage ([place]) 07/16/2018   Acute spontaneous 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 ([place]) L thalamic 07/14/2018   Cellulitis of arm, right 06/01/2018   Rash and nonspecific skin eruption 06/01/2018   Chest pain 02/02/2018   Thrombocytopenia ([place]) 02/02/2018   Hypokalemia 02/02/2018   Pulmonary embolism ([place]) 02/02/2018   Elevated troponin 02/02/2018   Pulmonary embolus ([place]) 02/02/2018   Non-ST elevation MI (NSTEMI) ([place]) 16/01/9603   Diastolic CHF ([place]) 54/12/8117   Chest pain due to CAD ([place]) 06/26/2017   Dyspnea 06/21/2017   Acute bronchitis 05/15/2017   Oral candidiasis 05/15/2017   Acute pericarditis    Chest pain at rest 12/20/2016   Angina pectoris ([place]) 12/20/2016   Radiation pneumonitis ([place]) 11/09/2016   Pleurisy 10/11/2016   Chest wall pain 10/11/2016   Atrial fibrillation ([place]) 05/21/2016   Adenocarcinoma of right lung ([place]) 01/06/2016   GAD (generalized anxiety disorder) 11/20/2015   Essential hypertension    Cellulitis 09/04/2015   Cellulitis of left axilla    Esophageal ulcer without bleeding    Bilateral lower extremity edema 04/28/2014   BPH (benign prostatic hyperplasia) 10/08/2012   Anxiety    EKG abnormalities 09/05/2012   Tremor 09/05/2012   Chronic fatigue 09/05/2012   Arthritis    Asthma, chronic    Reflux esophagitis    S/P CABG (coronary artery bypass graft)    GERD (gastroesophageal reflux disease)    Hyperlipidemia    Hypertension    UNSPECIFIED PERIPHERAL VASCULAR DISEASE 06/16/2009    Jones Bales, PT, DPT 10/08/2020, 1:41 PM  [facility] [address] [place], [place], [address] Phone: [phone]   Fax:  [phone]  Name:  Evan [name] MRN: [id number] Date of Birth: [birth year]

## 2020-10-08 NOTE — Therapy (Signed)
[facility] [address] [place], [place], [address] Phone: [phone]   Fax:  [phone]  Occupational Therapy Treatment  Patient Details  Name: Evan [name] MRN: [id number] Date of Birth: March 05, 1928 Referring Provider (OT): Dr. Jamse Arn   Encounter Date: 10/08/2020   OT End of Session - 10/08/20 1540     Visit Number 11    Number of Visits 17    Date for OT Re-Evaluation 10/24/20    Authorization Type [employer] [place] - Visit Number 11    Authorization - Number of Visits 20    Progress Note Due on Visit 20    OT Start Time [phone]    OT Stop Time 1017    OT Time Calculation (min) 39 min    Activity Tolerance Patient tolerated treatment well    Behavior During Therapy [facility] for tasks assessed/performed             Past Medical History:  Diagnosis Date   Adenocarcinoma of right lung ([place]) 01/06/2016   Anginal chest pain at rest [facility])    Chronic non, controlled on Lorazepam   Anxiety    Arthritis    "all over"    BPH (benign prostatic hyperplasia)    CAD (coronary artery disease)    Chronic lower back pain    Complication of anesthesia    "problems making water afterwards"   Depression    DJD (degenerative joint disease)    Esophageal ulcer without bleeding    bid ppi indefinitely   Family history of adverse reaction to anesthesia    "children w/PONV"   GERD (gastroesophageal reflux disease)    Headache    "couple/week maybe" (09/04/2015)   Hemochromatosis    Possible, elevated iron stores, hook-like osteophytes on hand films, normal LFTs   Hepatitis    "yellow jaundice as a baby"   History of ASCVD    MULTIVESSEL   Hyperlipidemia    Hypertension    Osteoarthritis    Pneumonia    "several times; got a little now" (09/04/2015)   Rheumatoid arthritis ([place])    Subdural hematoma ([place])    small after fall 09/2016-plavix held, neurosurgery consulted.  Asymptomatic.      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[place]) 02/2018   on Lovenox lifelong since he failed PO Eliquis    Past Surgical History:  Procedure Laterality Date   ARM SKIN LESION BIOPSY / EXCISION Left 09/02/2015   CATARACT EXTRACTION W/ INTRAOCULAR LENS  IMPLANT, BILATERAL Bilateral    CHOLECYSTECTOMY OPEN     CORNEAL TRANSPLANT Bilateral    "one at [facility]; one at [facility]"   [place] W/ STENT     DE stent ostium into the right radial free graft at OM-, 02-2007   ESOPHAGOGASTRODUODENOSCOPY N/A 11/09/2014   Procedure: ESOPHAGOGASTRODUODENOSCOPY (EGD);  Surgeon: [name], MD;  Location: [facility] ENDOSCOPY;  Service: Endoscopy;  Laterality: N/A;   INGUINAL HERNIA REPAIR     JOINT REPLACEMENT     LEFT HEART CATH AND CORS/GRAFTS ANGIOGRAPHY N/A 06/27/2017   Procedure: LEFT HEART CATH AND CORS/GRAFTS ANGIOGRAPHY;  Surgeon: [name], MD;  Location: [facility] CV LAB;  Service: Cardiovascular;  Laterality: N/A;   NASAL SINUS SURGERY     SHOULDER OPEN ROTATOR CUFF REPAIR Right    TOTAL KNEE ARTHROPLASTY Bilateral     There were no vitals  filed for this visit.   Subjective Assessment - 10/08/20 0942     Subjective  it's better    Patient is accompanied by: Family member    Pertinent History hx of Thalamic hemorrhage.        PMH:  HOH, CAD, RA, Hx of R RTC repair, hx of bilateral knee arthroplasty, hx of CABG, hx of aortic andurysm repair, hx of corneal transplant. hx of chronic low back pain, depression, R lung adenocarcinoma, posssible radiation (not active) pneumonitis, A fib, hx of DVT/PE--on treatment dose Lovenox, SDH 09/2016, chronic thrombocytopenia    Patient Stated Goals incr RUE functional use    Currently in Pain? No/denies                Light wt. Bearing through elbow and hand with body on arm movements with noted decr in RUE ataxia/tremors with min-mod cueing./facilitation for normal movement  patterns.    Focused on positioning of RUE in neutral shoulder position at rest with min cues/facilitation.  Noted pt with improved positioning/decr guarding/flexor pattern at rest with decr facilitation.  Removing cylinder/wooden pegs with min-mod difficulty and multiple rest breaks, ble to remove smallest (1-inch ones) using 2-point pinch today, min cueing for slow movements/normal movement patterns.  Replacing larger ones with mod difficulty x10 and min facilitation.  Grasp/release and low range functional reaching of cones with min-mod cueing/facilitation for control/stabilization.  Then with 1lb weighted object with min cueing/facilitation.    Picking up cup with BUEs and bring to mouth min difficulty/verbal and visual cues for timing.  Slow, controlled movements to lightly touch object without moving with min-mod difficulty/cues.    Recommended dtr cue pt at home for slow, controlled movement and use visual cues (for pt to mirror) to help with timing.          OT Education - 10/08/20 1542     Education Details Additions to HEP--see pt instructions (for incr RUE functional use and control)    Person(s) Educated Patient;Child(ren)    Methods Explanation;Demonstration;Verbal cues;Tactile cues    Comprehension Verbalized understanding;Returned demonstration;Verbal cues required;Tactile cues required              OT Short Term Goals - 09/29/20 0936       OT SHORT TERM GOAL #1   Title Pt/family will be independent with HEP for incr RUE functional use.--check STGs 09/24/20    Time 4    Period Weeks    Status Achieved      OT SHORT TERM GOAL #2   Title Pt will improve LUE coordination and low level functional reaching as shown by ability to grasp/release cylinder object in at least 3/5 trials.    Time 4    Period Weeks    Status On-going   09/29/20.  needs min A     OT SHORT TERM GOAL #3   Title Pt will be able to pick up 1" object using pinch at least 2/5 trials for incr  coordination for ADLs.    Time 4    Period Weeks    Status Achieved      OT SHORT TERM GOAL #4   Title --      OT SHORT TERM GOAL #5   Title --               OT Long Term Goals - 08/25/20 1513       OT LONG TERM GOAL #1   Title Pt will incorporate RUE functionally in at least  3 additional ADL tasks consistently.    Baseline currently only using to push up/reach back for w/c and lock w/c brake with cues    Time 8    Period Weeks    Status New      OT LONG TERM GOAL #2   Title Pt will improve RUE coordination as shown by improving score on box and blocks test by at least 4.    Baseline 4 blocks.    Time 8    Period Weeks    Status New      OT LONG TERM GOAL #3   Title Pt will improve LUE coordination and functional reaching to midlevel as shown by ability to grasp/release cylinder object in at least 3/5 trials.    Time 8    Period Weeks    Status New      OT LONG TERM GOAL #4   Title Pt will be able to pick up 1" object using pinch and place in bowl at least 4/5 trials for incr coordination for ADLs.    Time 8    Period Weeks    Status New                   Plan - 10/08/20 1541     Clinical Impression Statement Pt continues to make slow steady progress with improving RUE control and functional use.    OT Occupational Profile and History Detailed Assessment- Review of Records and additional review of physical, cognitive, psychosocial history related to current functional performance    Occupational performance deficits (Please refer to evaluation for details): ADL's;IADL's    Body Structure / Function / Physical Skills ADL;Decreased knowledge of use of DME;Strength;UE functional use;ROM;FMC;GMC;Dexterity;Mobility;Coordination    Rehab Potential Fair    Modification or Assistance to Complete Evaluation  Min-Moderate modification of tasks or assist with assess necessary to complete eval    OT Frequency 2x / week    OT Duration 8 weeks    OT  Treatment/Interventions Moist Heat;Fluidtherapy;Self-care/ADL training;DME and/or AE instruction;Splinting;Therapeutic activities;Therapeutic exercise;Neuromuscular education;Functional Mobility Training;Passive range of motion;Patient/family education;Manual Therapy    Plan RUE functional use, emphasis on slow/controlled movements, light wt. bearing.  try using BUEs for simulated pulling up pants.  check goals and plan to renew after next week.    OT Home Exercise Plan Pt has been doing shoulder flex with cane and theraband exercises (shoulder ext, scapular retraction, horizontal abduction) per dtr--instructed to hold on theraband exercises due to decr control RUE    Family Member Consulted daughter             Patient will benefit from skilled therapeutic intervention in order to improve the following deficits and impairments:   Body Structure / Function / Physical Skills: ADL, Decreased knowledge of use of DME, Strength, UE functional use, ROM, FMC, GMC, Dexterity, Mobility, Coordination       Visit Diagnosis: Ataxia  Other symptoms and signs involving the nervous system  Other lack of coordination  Other symptoms and signs involving the musculoskeletal system  Muscle weakness (generalized)    Problem List Patient Active Problem List   Diagnosis Date Noted   Rib pain on right side 03/19/2020   Sundowning 07/04/2019   Bilateral hearing loss 04/16/2019   Urinary frequency 03/21/2019   Chronic right shoulder pain 12/19/2018   Weakness generalized 09/28/2018   Palliative care encounter 09/28/2018   Pressure injury of skin 08/27/2018   COPD (chronic obstructive pulmonary disease) ([place]) 08/26/2018   AMS (  altered mental status) 08/22/2018   Altered mental status 08/21/2018   UTI (urinary tract infection) 08/21/2018   Abnormality of gait 08/20/2018   Prolonged Q-T interval on ECG    Dysphagia, post-stroke    Supplemental oxygen dependent    Anxiety state    Fatigue     SOB (shortness of breath)    Hyperglycemia    History of lung cancer    Depression    CKD (chronic kidney disease), stage III (HCC)    Acute blood loss anemia    Fall 07/16/2018   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   Acute spontaneous 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 ([place]) L thalamic 07/14/2018   Cellulitis of arm, right 06/01/2018   Rash and nonspecific skin eruption 06/01/2018   Chest pain 02/02/2018   Thrombocytopenia ([place]) 02/02/2018   Hypokalemia 02/02/2018   Pulmonary embolism ([place]) 02/02/2018   Elevated troponin 02/02/2018   Pulmonary embolus ([place]) 02/02/2018   Non-ST elevation MI (NSTEMI) ([place]) 38/75/6433   Diastolic CHF ([place]) 29/51/8841   Chest pain due to CAD ([place]) 06/26/2017   Dyspnea 06/21/2017   Acute bronchitis 05/15/2017   Oral candidiasis 05/15/2017   Acute pericarditis    Chest pain at rest 12/20/2016   Angina pectoris ([place]) 12/20/2016   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   Pleurisy 10/11/2016   Chest wall pain 10/11/2016   Atrial fibrillation ([place]) 05/21/2016   Adenocarcinoma of right lung ([place]) 01/06/2016   GAD (generalized anxiety disorder) 11/20/2015   Essential hypertension    Cellulitis 09/04/2015   Cellulitis of left axilla    Esophageal ulcer without bleeding    Bilateral lower extremity edema 04/28/2014   BPH (benign prostatic hyperplasia) 10/08/2012   Anxiety    EKG abnormalities 09/05/2012   Tremor 09/05/2012   Chronic fatigue 09/05/2012   Arthritis    Asthma, chronic    Reflux esophagitis    S/P CABG (coronary artery bypass graft)    GERD (gastroesophageal reflux disease)    Hyperlipidemia    Hypertension    UNSPECIFIED PERIPHERAL VASCULAR DISEASE 06/16/2009    [facility] 10/08/2020, 3:43 PM  [facility] [address] [place], [place], [address] Phone: [phone]   Fax:   [phone]  Name: TRONG GOSLING MRN: [id number] Date of Birth: [birth year]  Vianne Bulls, OTR/L [facility] [address] [place], [place]  [address] [phone] phone [phone] 10/08/20 3:47 PM

## 2020-10-12 ENCOUNTER — Other Ambulatory Visit (HOSPITAL_COMMUNITY): Payer: Self-pay

## 2020-10-19 ENCOUNTER — Ambulatory Visit: Payer: Medicare HMO

## 2020-10-19 ENCOUNTER — Encounter: Payer: Medicare HMO | Admitting: Occupational Therapy

## 2020-10-20 ENCOUNTER — Ambulatory Visit: Payer: Medicare HMO | Admitting: Occupational Therapy

## 2020-10-22 ENCOUNTER — Encounter: Payer: Self-pay | Admitting: Physical Therapy

## 2020-10-22 ENCOUNTER — Ambulatory Visit: Payer: Medicare HMO | Admitting: Physical Therapy

## 2020-10-22 ENCOUNTER — Ambulatory Visit: Payer: Medicare HMO | Admitting: Occupational Therapy

## 2020-10-22 ENCOUNTER — Other Ambulatory Visit: Payer: Self-pay

## 2020-10-22 DIAGNOSIS — R29898 Other symptoms and signs involving the musculoskeletal system: Secondary | ICD-10-CM

## 2020-10-22 DIAGNOSIS — R29818 Other symptoms and signs involving the nervous system: Secondary | ICD-10-CM

## 2020-10-22 DIAGNOSIS — R262 Difficulty in walking, not elsewhere classified: Secondary | ICD-10-CM | POA: Diagnosis not present

## 2020-10-22 DIAGNOSIS — R278 Other lack of coordination: Secondary | ICD-10-CM | POA: Diagnosis not present

## 2020-10-22 DIAGNOSIS — R270 Ataxia, unspecified: Secondary | ICD-10-CM | POA: Diagnosis not present

## 2020-10-22 DIAGNOSIS — M6281 Muscle weakness (generalized): Secondary | ICD-10-CM | POA: Diagnosis not present

## 2020-10-22 DIAGNOSIS — R2681 Unsteadiness on feet: Secondary | ICD-10-CM | POA: Diagnosis not present

## 2020-10-22 DIAGNOSIS — R2689 Other abnormalities of gait and mobility: Secondary | ICD-10-CM

## 2020-10-22 NOTE — Therapy (Signed)
[facility] [address] [place], [place], [address] Phone: [phone]   Fax:  [phone]  Occupational Therapy Treatment  Patient Details  Name: Evan [name] MRN: [id number] Date of Birth: 1927-12-21 Referring Provider (OT): Dr. Jamse Arn   Encounter Date: 10/22/2020   OT End of Session - 10/22/20 1017     Visit Number 12    Number of Visits 17    Date for OT Re-Evaluation 10/24/20    Authorization Type [employer]--awaiting auth    Authorization - Visit Number 12    Authorization - Number of Visits 20    Progress Note Due on Visit 20    OT Start Time 1014    OT Stop Time 1056    OT Time Calculation (min) 42 min    Activity Tolerance Patient tolerated treatment well    Behavior During Therapy [facility] for tasks assessed/performed             Past Medical History:  Diagnosis Date   Adenocarcinoma of right lung ([place]) 01/06/2016   Anginal chest pain at rest [facility])    Chronic non, controlled on Lorazepam   Anxiety    Arthritis    "all over"    BPH (benign prostatic hyperplasia)    CAD (coronary artery disease)    Chronic lower back pain    Complication of anesthesia    "problems making water afterwards"   Depression    DJD (degenerative joint disease)    Esophageal ulcer without bleeding    bid ppi indefinitely   Family history of adverse reaction to anesthesia    "children w/PONV"   GERD (gastroesophageal reflux disease)    Headache    "couple/week maybe" (09/04/2015)   Hemochromatosis    Possible, elevated iron stores, hook-like osteophytes on hand films, normal LFTs   Hepatitis    "yellow jaundice as a baby"   History of ASCVD    MULTIVESSEL   Hyperlipidemia    Hypertension    Osteoarthritis    Pneumonia    "several times; got a little now" (09/04/2015)   Rheumatoid arthritis ([place])    Subdural hematoma ([place])    small after fall 09/2016-plavix held, neurosurgery consulted.  Asymptomatic.      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[place]) 02/2018   on Lovenox lifelong since he failed PO Eliquis    Past Surgical History:  Procedure Laterality Date   ARM SKIN LESION BIOPSY / EXCISION Left 09/02/2015   CATARACT EXTRACTION W/ INTRAOCULAR LENS  IMPLANT, BILATERAL Bilateral    CHOLECYSTECTOMY OPEN     CORNEAL TRANSPLANT Bilateral    "one at [facility]; one at [facility]"   [place] W/ STENT     DE stent ostium into the right radial free graft at OM-, 02-2007   ESOPHAGOGASTRODUODENOSCOPY N/A 11/09/2014   Procedure: ESOPHAGOGASTRODUODENOSCOPY (EGD);  Surgeon: [name], MD;  Location: [facility] ENDOSCOPY;  Service: Endoscopy;  Laterality: N/A;   INGUINAL HERNIA REPAIR     JOINT REPLACEMENT     LEFT HEART CATH AND CORS/GRAFTS ANGIOGRAPHY N/A 06/27/2017   Procedure: LEFT HEART CATH AND CORS/GRAFTS ANGIOGRAPHY;  Surgeon: [name], MD;  Location: [facility] CV LAB;  Service: Cardiovascular;  Laterality: N/A;   NASAL SINUS SURGERY     SHOULDER OPEN ROTATOR CUFF REPAIR Right    TOTAL KNEE ARTHROPLASTY Bilateral     There were no vitals  filed for this visit.   Subjective Assessment - 10/22/20 1016     Subjective  Pt stated that it hurt his arm to attempt wt bearing through Rt elbow/forearm    Patient is accompanied by: Family member    Pertinent History hx of Thalamic hemorrhage. PMH: HOH, CAD, RA, Hx of R RTC repair, hx of bilateral knee arthroplasty, hx of CABG, hx of aortic andurysm repair, hx of corneal transplant, hx of chronic low back pain, depression, R lung adenocarcinoma, posssible radiation (not active) pneumonitis, A fib, hx of DVT/PE--on treatment dose Lovenox, SDH 09/2016, chronic thrombocytopenia    Patient Stated Goals incr RUE functional use    Currently in Pain? No/denies             Treatment/Exercises - 10/22/20     Grasp and Release  Stacking therapy cones to facilitate functional  reach and grasp and release. Incorporated cones w/ non-slip grip due to initial difficulty w/ grasp/multiple drops; also provided stabilization of cones during both grasp and release for increased control and stabilization. Towel roll positioned to facilitate RUE adduction w/ positive results; min cueing to maintain positioning    Blocks Picking up 1" blocks off tabletop and placing in container with min cues for slow/controlled movements and facilitation of typical movement patterns; able to pick up and drop 19 blocks into container in 1 min    BUE Coordination BUE chest press w/ 2# dowel; completed 4 sets of 5 reps w/ rest breaks between sets; also completed bicep curls w/ 2# dowel; 2 sets of 10 reps w/ 1 rest break    Towel slides Towel slides on tabletop for RUE abduction w/ min facilitation/cues for prevention of compensatory/abnormal movement patterns            OT Short Term Goals - 09/29/20 0936       OT SHORT TERM GOAL #1   Title Pt/family will be independent with HEP for incr RUE functional use.--check STGs 09/24/20    Time 4    Period Weeks    Status Achieved      OT SHORT TERM GOAL #2   Title Pt will improve LUE coordination and low level functional reaching as shown by ability to grasp/release cylinder object in at least 3/5 trials.    Time 4    Period Weeks    Status On-going   09/29/20.  needs min A     OT SHORT TERM GOAL #3   Title Pt will be able to pick up 1" object using pinch at least 2/5 trials for incr coordination for ADLs.    Time 4    Period Weeks    Status Achieved            OT Long Term Goals - 10/22/20 1653       OT LONG TERM GOAL #1   Title Pt will incorporate RUE functionally in at least 3 additional ADL tasks consistently.    Baseline currently only using to push up/reach back for w/c and lock w/c brake with cues    Time 8    Period Weeks    Status On-going      OT LONG TERM GOAL #2   Title Pt will improve RUE coordination as shown by  improving score on box and blocks test by at least 4.    Baseline 4 blocks.    Time 8    Period Weeks    Status On-going   10/22/20 - 7 blocks w/ RUE  OT LONG TERM GOAL #3   Title Pt will improve LUE coordination and functional reaching to midlevel as shown by ability to grasp/release cylinder object in at least 3/5 trials.    Time 8    Period Weeks    Status On-going      OT LONG TERM GOAL #4   Title Pt will be able to pick up 1" object using pinch and place in bowl at least 4/5 trials for incr coordination for ADLs.    Time 8    Period Weeks    Status On-going             Plan - 10/22/20 1029     Clinical Impression Statement Pt continues to make progress. During activities this session, OT positioned towel roll between pt's trunk and RUE to facilitate increased control/decreased irregular movement patterns w/ positive results. At end of session OT discovered mild bleeding from pt's fingertip; a band-aid was provided and therapist reiterated blood thinner considerations; pt and family member were receptive.   OT Occupational Profile and History Detailed Assessment- Review of Records and additional review of physical, cognitive, psychosocial history related to current functional performance    Occupational performance deficits (Please refer to evaluation for details): ADL's;IADL's    Body Structure / Function / Physical Skills ADL;Decreased knowledge of use of DME;Strength;UE functional use;ROM;FMC;GMC;Dexterity;Mobility;Coordination    Rehab Potential Fair    Modification or Assistance to Complete Evaluation  Min-Moderate modification of tasks or assist with assess necessary to complete eval    OT Frequency 2x / week    OT Duration 8 weeks    OT Treatment/Interventions Moist Heat;Fluidtherapy;Self-care/ADL training;DME and/or AE instruction;Splinting;Therapeutic activities;Therapeutic exercise;Neuromuscular education;Functional Mobility Training;Passive range of  motion;Patient/family education;Manual Therapy    Plan Recertification? (cert order end date 9/62/22 / date for OT re-eval 10/24/20?); continue RUE functional use, emphasis on slow/controlled movements, light wt. bearing; try using BUEs for simulated pulling up pants   OT Home Exercise Plan Pt has been doing shoulder flex with cane and theraband exercises (shoulder ext, scapular retraction, horizontal abduction) per dtr--instructed to hold on theraband exercises due to decr control RUE    Family Member Consulted daughter             Patient will benefit from skilled therapeutic intervention in order to improve the following deficits and impairments:   Body Structure / Function / Physical Skills: ADL, Decreased knowledge of use of DME, Strength, UE functional use, ROM, FMC, GMC, Dexterity, Mobility, Coordination   Visit Diagnosis: Ataxia  Other lack of coordination  Muscle weakness (generalized)  Other symptoms and signs involving the musculoskeletal system  Other symptoms and signs involving the nervous system    Problem List Patient Active Problem List   Diagnosis Date Noted   Rib pain on right side 03/19/2020   Sundowning 07/04/2019   Bilateral hearing loss 04/16/2019   Urinary frequency 03/21/2019   Chronic right shoulder pain 12/19/2018   Weakness generalized 09/28/2018   Palliative care encounter 09/28/2018   Pressure injury of skin 08/27/2018   COPD (chronic obstructive pulmonary disease) ([place]) 08/26/2018   AMS (altered mental status) 08/22/2018   Altered mental status 08/21/2018   UTI (urinary tract infection) 08/21/2018   Abnormality of gait 08/20/2018   Prolonged Q-T interval on ECG    Dysphagia, post-stroke    Supplemental oxygen dependent    Anxiety state    Fatigue    SOB (shortness of breath)    Hyperglycemia    History of  lung cancer    Depression    CKD (chronic kidney disease), stage III (HCC)    Acute blood loss anemia    Fall 07/16/2018    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   Acute spontaneous 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 ([place]) L thalamic 07/14/2018   Cellulitis of arm, right 06/01/2018   Rash and nonspecific skin eruption 06/01/2018   Chest pain 02/02/2018   Thrombocytopenia ([place]) 02/02/2018   Hypokalemia 02/02/2018   Pulmonary embolism ([place]) 02/02/2018   Elevated troponin 02/02/2018   Pulmonary embolus ([place]) 02/02/2018   Non-ST elevation MI (NSTEMI) ([place]) 29/51/8841   Diastolic CHF ([place]) 66/09/3014   Chest pain due to CAD ([place]) 06/26/2017   Dyspnea 06/21/2017   Acute bronchitis 05/15/2017   Oral candidiasis 05/15/2017   Acute pericarditis    Chest pain at rest 12/20/2016   Angina pectoris ([place]) 12/20/2016   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   Pleurisy 10/11/2016   Chest wall pain 10/11/2016   Atrial fibrillation ([place]) 05/21/2016   Adenocarcinoma of right lung ([place]) 01/06/2016   GAD (generalized anxiety disorder) 11/20/2015   Essential hypertension    Cellulitis 09/04/2015   Cellulitis of left axilla    Esophageal ulcer without bleeding    Bilateral lower extremity edema 04/28/2014   BPH (benign prostatic hyperplasia) 10/08/2012   Anxiety    EKG abnormalities 09/05/2012   Tremor 09/05/2012   Chronic fatigue 09/05/2012   Arthritis    Asthma, chronic    Reflux esophagitis    S/P CABG (coronary artery bypass graft)    GERD (gastroesophageal reflux disease)    Hyperlipidemia    Hypertension    UNSPECIFIED PERIPHERAL VASCULAR DISEASE 06/16/2009     Kathrine Cords, OTR/L, MSOT  10/22/2020, 10:30 AM  [facility] [address] [place], [place], [address] Phone: [phone]   Fax:  [phone]  Name: Evan [name] MRN: [id number] Date of Birth: 06/11/1927

## 2020-10-22 NOTE — Therapy (Signed)
[facility] [address] [place], [place], [address] Phone: [phone]   Fax:  [phone]  Physical Therapy Treatment  Patient Details  Name: Evan [name] MRN: [id number] Date of Birth: 1927-09-24 Referring Provider (PT): Delice Lesch, MD   Encounter Date: 10/22/2020   PT End of Session - 10/22/20 0938     Visit Number 11    Number of Visits 18    Date for PT Re-Evaluation 11/13/20    Authorization Type [employer] (10th Visit PN)    Authorization Time Period Awaiting Authorization Approval    PT Start Time 0932    PT Stop Time 1012    PT Time Calculation (min) 40 min    Equipment Utilized During Treatment Gait belt    Activity Tolerance Patient tolerated treatment well;Patient limited by fatigue    Behavior During Therapy [facility] for tasks assessed/performed             Past Medical History:  Diagnosis Date   Adenocarcinoma of right lung ([place]) 01/06/2016   Anginal chest pain at rest [facility])    Chronic non, controlled on Lorazepam   Anxiety    Arthritis    "all over"    BPH (benign prostatic hyperplasia)    CAD (coronary artery disease)    Chronic lower back pain    Complication of anesthesia    "problems making water afterwards"   Depression    DJD (degenerative joint disease)    Esophageal ulcer without bleeding    bid ppi indefinitely   Family history of adverse reaction to anesthesia    "children w/PONV"   GERD (gastroesophageal reflux disease)    Headache    "couple/week maybe" (09/04/2015)   Hemochromatosis    Possible, elevated iron stores, hook-like osteophytes on hand films, normal LFTs   Hepatitis    "yellow jaundice as a baby"   History of ASCVD    MULTIVESSEL   Hyperlipidemia    Hypertension    Osteoarthritis    Pneumonia    "several times; got a little now" (09/04/2015)   Rheumatoid arthritis ([place])    Subdural hematoma ([place])    small after fall 09/2016-plavix held, neurosurgery  consulted.  Asymptomatic.     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[place]) 02/2018   on Lovenox lifelong since he failed PO Eliquis    Past Surgical History:  Procedure Laterality Date   ARM SKIN LESION BIOPSY / EXCISION Left 09/02/2015   CATARACT EXTRACTION W/ INTRAOCULAR LENS  IMPLANT, BILATERAL Bilateral    CHOLECYSTECTOMY OPEN     CORNEAL TRANSPLANT Bilateral    "one at [facility]; one at [facility]"   [place] W/ STENT     DE stent ostium into the right radial free graft at [facility]-, 02-2007   ESOPHAGOGASTRODUODENOSCOPY N/A 11/09/2014   Procedure: ESOPHAGOGASTRODUODENOSCOPY (EGD);  Surgeon: [name], MD;  Location: [facility] ENDOSCOPY;  Service: Endoscopy;  Laterality: N/A;   INGUINAL HERNIA REPAIR     JOINT REPLACEMENT     LEFT HEART CATH AND CORS/GRAFTS ANGIOGRAPHY N/A 06/27/2017   Procedure: LEFT HEART CATH AND CORS/GRAFTS ANGIOGRAPHY;  Surgeon: [name], MD;  Location: [facility] CV LAB;  Service: Cardiovascular;  Laterality: N/A;   NASAL SINUS SURGERY     SHOULDER OPEN ROTATOR CUFF REPAIR Right    TOTAL KNEE ARTHROPLASTY Bilateral     There were no vitals filed for this visit.  Subjective Assessment - 10/22/20 0935     Subjective No new complaints. No falls or pain to report. Did have root canal on Monday and is now on Amoxicillian to prevent infection. Missed Tuesday because they were stuck at dentist getting mold done for hip crown (took almost 2 hours because they had to do it 3 times).    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 No/denies                    [facility] Adult PT Treatment/Exercise - 10/22/20 0939       Transfers   Transfers Sit to Stand;Stand to Sit;Stand Pivot Transfers    Sit to Stand 4: Min guard;With upper extremity assist;From bed;From chair/3-in-1    Sit to Stand Details (indicate cue type and reason) cues to scoot to edge of surface prior to standing, to secure right hand on orthotic and for bil foot position prior to standing. cues to slow down and wait for these items at times due to impulsivity with standing.    Stand to Sit 4: Min guard;With upper extremity assist;To bed;To chair/3-in-1    Stand to Sit Details cues to reach back to wheelchair/mat prior to sititng down for assist with controlled descent    Stand Pivot Transfers 4: Min guard    Stand Pivot Transfer Details (indicate cue type and reason) with RW/right hand orthotic/righ AFO from wheelchair>mat table with cues on posture, sequencing and step placement.      Ambulation/Gait   Ambulation/Gait Yes    Ambulation/Gait Assistance 4: Min guard;4: Min assist    Ambulation/Gait Assistance Details min guard initially with up to min assist as pt fatigues with gait. cues needed toward end of each rep for posture,  right step placement (tends to scissor toward midline) and to increase base of support.    Ambulation Distance (Feet) 72 Feet   x1, 75 x1   Assistive device Rolling walker;Other (Comment)    Gait Pattern Step-to pattern;Step-through pattern;Decreased step length - right;Decreased step length - left;Narrow base of support    Ambulation Surface Level;Indoor      Neuro Re-ed    Neuro Re-ed Details  for balance/activity tolerance: workin gon standing posture and tolerance with no UE support while engaging pt in various games/activities. 1 minute 40.78 sec's, 2 minues 11.38 sec's, 56.22 sec's. pt with increased standing time while engaged in placing colored wooded letters on puzzle board with PTA pointing to where each  letter goes. Decreased time with last rep while filling up connect 4 board with chips. min guard to min assist with each stand.                      PT Short Term Goals - 10/08/20 1342       PT SHORT TERM GOAL #1   Title = LTGs               PT Long Term Goals - 10/08/20 1342       PT LONG TERM GOAL #1   Title Patient will be independent with final HEP for strength/balance with family  member assistance (All LTGs Due: 11/13/20)    Baseline daughter/paitent reports independence with HEP, completing daily. will continue to benefit from progressive HEP    Time 4    Period Weeks    Status Revised      PT LONG TERM GOAL #2   Title Patient will be able to complete 5x sit <> stand in </= 20 seconds with CGA to demo improved balance and functional mobility    Baseline unable to complete; 22.53 seconds on 6/22    Time 4    Period Weeks    Status Revised      PT LONG TERM GOAL #3   Title Patient will demo ability to complete transfers from w/c <> mat with RW and supervision    Baseline CGA - Min A; CGA required with RW    Time 4    Period Weeks    Status Revised      PT LONG TERM GOAL #4   Title Patient will be able to stand >/= 2 minutes without UE support to improve tolerance for ADLs    Baseline < 30 seconds; 1 minute, 38 seconds on 7/7    Time 4    Period Weeks    Status On-going      PT LONG TERM GOAL #5   Title Patient will be able to ambulate for >/= 100 ft with LBQC vs RW for improved household mobility    Baseline 20 ft; 70 ft    Time 4    Period Weeks    Status On-going                   Plan - 10/22/20 1937     Clinical Impression Statement Today's skilled session continued to focus on gait, transfers and standing posture/tolerance with rest breaks taken as needed. Pt was able to increase his distance this session and his total standing time while engaged in activity. No issues noted or reported other than fatigue with session. The pt is  making progress toward goals and should benefit from continued PT to progress toward unmet goals.    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, lumbar fracture December 2020    Examination-Activity Limitations Bed Mobility;Locomotion Level;Sit;Stand;Transfers    Examination-Participation Restrictions Medication Management;Community Activity;Interpersonal Relationship    Stability/Clinical Decision Making Evolving/Moderate complexity    Rehab Potential Fair    PT Frequency 2x / week    PT Duration 4 weeks    PT Treatment/Interventions ADLs/Self Care Home Management;Cryotherapy;Moist Heat;DME Instruction;Gait training;Stair training;Functional mobility training;Therapeutic activities;Therapeutic exercise;Balance training;Neuromuscular re-education;Patient/family education;Orthotic Fit/Training;Manual techniques;Wheelchair mobility training;Passive range of motion;Visual/perceptual remediation/compensation    PT Next Visit Plan Continue gait training w/ RW and hand grip attachment- does well with targets (need to problem solve to start to include turns so to increase his distance). Work on improving standing tolerance- did well with puzzles, bean bag toss, connect 4    Consulted and Agree with Plan of Care Patient;Family member/caregiver    Family Member Consulted Daughter/Son             Patient will benefit from skilled therapeutic intervention in order to improve the following deficits and impairments:  Abnormal gait, Decreased coordination, Decreased range of motion, Difficulty walking, Impaired tone, Pain, Impaired UE functional use, Decreased safety awareness, Decreased endurance, Decreased  activity tolerance, Decreased knowledge of use of DME, Decreased  balance, Decreased cognition, Decreased strength, Impaired sensation  Visit Diagnosis: Difficulty in walking, not elsewhere classified  Muscle weakness (generalized)  Other symptoms and signs involving the musculoskeletal system  Other abnormalities of gait and mobility     Problem List Patient Active Problem List   Diagnosis Date Noted   Rib pain on right side 03/19/2020   Sundowning 07/04/2019   Bilateral hearing loss 04/16/2019   Urinary frequency 03/21/2019   Chronic right shoulder pain 12/19/2018   Weakness generalized 09/28/2018   Palliative care encounter 09/28/2018   Pressure injury of skin 08/27/2018   COPD (chronic obstructive pulmonary disease) ([place]) 08/26/2018   AMS (altered mental status) 08/22/2018   Altered mental status 08/21/2018   UTI (urinary tract infection) 08/21/2018   Abnormality of gait 08/20/2018   Prolonged Q-T interval on ECG    Dysphagia, post-stroke    Supplemental oxygen dependent    Anxiety state    Fatigue    SOB (shortness of breath)    Hyperglycemia    History of lung cancer    Depression    CKD (chronic kidney disease), stage III ([place])    Acute blood loss anemia    Fall 07/16/2018   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   Thalamic hemorrhage ([place]) 07/16/2018   Acute spontaneous 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 ([place]) L thalamic 07/14/2018   Cellulitis of arm, right 06/01/2018   Rash and nonspecific skin eruption 06/01/2018   Chest pain 02/02/2018   Thrombocytopenia ([place]) 02/02/2018   Hypokalemia 02/02/2018   Pulmonary embolism ([place]) 02/02/2018   Elevated troponin 02/02/2018   Pulmonary embolus ([place]) 02/02/2018   Non-ST elevation MI (NSTEMI) ([place]) 44/81/8563   Diastolic CHF ([place]) 14/97/0263   Chest pain due to CAD ([place]) 06/26/2017   Dyspnea 06/21/2017   Acute bronchitis 05/15/2017   Oral candidiasis 05/15/2017   Acute pericarditis    Chest pain at rest 12/20/2016   Angina pectoris ([place])  12/20/2016   Radiation pneumonitis ([place]) 11/09/2016   Pleurisy 10/11/2016   Chest wall pain 10/11/2016   Atrial fibrillation ([place]) 05/21/2016   Adenocarcinoma of right lung ([place]) 01/06/2016   GAD (generalized anxiety disorder) 11/20/2015   Essential hypertension    Cellulitis 09/04/2015   Cellulitis of left axilla    Esophageal ulcer without bleeding    Bilateral lower extremity edema 04/28/2014   BPH (benign prostatic hyperplasia) 10/08/2012   Anxiety    EKG abnormalities 09/05/2012   Tremor 09/05/2012   Chronic fatigue 09/05/2012   Arthritis    Asthma, chronic    Reflux esophagitis    S/P CABG (coronary artery bypass graft)    GERD (gastroesophageal reflux disease)    Hyperlipidemia    Hypertension    UNSPECIFIED PERIPHERAL VASCULAR DISEASE 06/16/2009    Willow Ora, PTA, [facility] [address], [place], [place] [address] [phone] 10/22/20, 10:42 AM   Name: Evan [name] MRN: [id number] Date of Birth: 01-25-1928

## 2020-10-23 DIAGNOSIS — R3915 Urgency of urination: Secondary | ICD-10-CM | POA: Diagnosis not present

## 2020-10-28 ENCOUNTER — Ambulatory Visit: Payer: Medicare HMO

## 2020-10-28 ENCOUNTER — Other Ambulatory Visit: Payer: Self-pay | Admitting: Interventional Cardiology

## 2020-10-28 ENCOUNTER — Other Ambulatory Visit: Payer: Self-pay

## 2020-10-28 ENCOUNTER — Ambulatory Visit: Payer: Medicare HMO | Admitting: Occupational Therapy

## 2020-10-28 DIAGNOSIS — R262 Difficulty in walking, not elsewhere classified: Secondary | ICD-10-CM

## 2020-10-28 DIAGNOSIS — R2689 Other abnormalities of gait and mobility: Secondary | ICD-10-CM

## 2020-10-28 DIAGNOSIS — R29898 Other symptoms and signs involving the musculoskeletal system: Secondary | ICD-10-CM | POA: Diagnosis not present

## 2020-10-28 DIAGNOSIS — R2681 Unsteadiness on feet: Secondary | ICD-10-CM | POA: Diagnosis not present

## 2020-10-28 DIAGNOSIS — R278 Other lack of coordination: Secondary | ICD-10-CM

## 2020-10-28 DIAGNOSIS — R270 Ataxia, unspecified: Secondary | ICD-10-CM | POA: Diagnosis not present

## 2020-10-28 DIAGNOSIS — M6281 Muscle weakness (generalized): Secondary | ICD-10-CM | POA: Diagnosis not present

## 2020-10-28 DIAGNOSIS — R29818 Other symptoms and signs involving the nervous system: Secondary | ICD-10-CM | POA: Diagnosis not present

## 2020-10-28 NOTE — Therapy (Signed)
[facility] [address] [place], [place], [address] Phone: [phone]   Fax:  [phone]  Physical Therapy Treatment  Patient Details  Name: Evan [name] MRN: [id number] Date of Birth: 1927/12/27 Referring Provider (PT): Delice Lesch, MD   Encounter Date: 10/28/2020   PT End of Session - 10/28/20 0938     Visit Number 12    Number of Visits 18    Date for PT Re-Evaluation 11/13/20    Authorization Type [employer] (10th Visit PN)    Authorization Time Period Awaiting Authorization Approval    PT Start Time 0930    PT Stop Time 1015    PT Time Calculation (min) 45 min    Equipment Utilized During Treatment Gait belt    Activity Tolerance Patient tolerated treatment well;Patient limited by fatigue    Behavior During Therapy [facility] for tasks assessed/performed             Past Medical History:  Diagnosis Date   Adenocarcinoma of right lung ([place]) 01/06/2016   Anginal chest pain at rest [facility])    Chronic non, controlled on Lorazepam   Anxiety    Arthritis    "all over"    BPH (benign prostatic hyperplasia)    CAD (coronary artery disease)    Chronic lower back pain    Complication of anesthesia    "problems making water afterwards"   Depression    DJD (degenerative joint disease)    Esophageal ulcer without bleeding    bid ppi indefinitely   Family history of adverse reaction to anesthesia    "children w/PONV"   GERD (gastroesophageal reflux disease)    Headache    "couple/week maybe" (09/04/2015)   Hemochromatosis    Possible, elevated iron stores, hook-like osteophytes on hand films, normal LFTs   Hepatitis    "yellow jaundice as a baby"   History of ASCVD    MULTIVESSEL   Hyperlipidemia    Hypertension    Osteoarthritis    Pneumonia    "several times; got a little now" (09/04/2015)   Rheumatoid arthritis ([place])    Subdural hematoma ([place])    small after fall 09/2016-plavix held, neurosurgery  consulted.  Asymptomatic.     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[place]) 02/2018   on Lovenox lifelong since he failed PO Eliquis    Past Surgical History:  Procedure Laterality Date   ARM SKIN LESION BIOPSY / EXCISION Left 09/02/2015   CATARACT EXTRACTION W/ INTRAOCULAR LENS  IMPLANT, BILATERAL Bilateral    CHOLECYSTECTOMY OPEN     CORNEAL TRANSPLANT Bilateral    "one at [facility]; one at [facility]"   [place] W/ STENT     DE stent ostium into the right radial free graft at OM-, 02-2007   ESOPHAGOGASTRODUODENOSCOPY N/A 11/09/2014   Procedure: ESOPHAGOGASTRODUODENOSCOPY (EGD);  Surgeon: [name], MD;  Location: [facility] ENDOSCOPY;  Service: Endoscopy;  Laterality: N/A;   INGUINAL HERNIA REPAIR     JOINT REPLACEMENT     LEFT HEART CATH AND CORS/GRAFTS ANGIOGRAPHY N/A 06/27/2017   Procedure: LEFT HEART CATH AND CORS/GRAFTS ANGIOGRAPHY;  Surgeon: [name], MD;  Location: [facility] CV LAB;  Service: Cardiovascular;  Laterality: N/A;   NASAL SINUS SURGERY     SHOULDER OPEN ROTATOR CUFF REPAIR Right    TOTAL KNEE ARTHROPLASTY Bilateral     There were no vitals filed for this visit.  Subjective Assessment - 10/28/20 0936     Subjective Has finished the amoxicillin for the tooth. Daughter reports one instance of wear he sat on bed and fell backwards, has had some soreness in the low back. No other new changes.    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 Yes    Pain Score 7     Pain Location Back    Pain Orientation Lower;Mid    Pain  Descriptors / Indicators Aching    Pain Type Acute pain    Pain Onset In the past 7 days    Pain Frequency Intermittent    Aggravating Factors  standing; walking               [facility] Adult PT Treatment/Exercise - 10/28/20 0001       Transfers   Transfers Sit to Stand;Stand to Sit;Stand Pivot Transfers    Sit to Stand 4: Min guard;With upper extremity assist;From bed;From chair/3-in-1    Sit to Stand Details Verbal cues for technique;Tactile cues for sequencing;Tactile cues for weight shifting    Sit to Stand Details (indicate cue type and reason) continue to provide cues for foot placement to ensure full contact with ground prior to standing, as well as scooting toward end of w.c    Stand to Sit 4: Min guard;With upper extremity assist;To bed;To chair/3-in-1    Stand to Sit Details cues to reach back    Comments completed sit <> stands x 5 reps throughout session with activities.      Ambulation/Gait   Ambulation/Gait Yes    Ambulation/Gait Assistance 4: Min guard;4: Min assist    Ambulation/Gait Assistance Details completed ambulation with initial two trials around track in therapy gym to begin to incorporate small turns with ambulation, patient tolerating well able to ambulate x 62 ft with first trial, x 58 ft second trial. PT providing faciliation and cues for placement of RLE to promote improved base of support. After NMR completed one last gait session out of therapy gym with patient able to ambulate x 35 ft. Increased fatigue noted at end.    Ambulation Distance (Feet) 62 Feet   x 1, 58 x 1, 35 x 1   Assistive device Rolling walker;Other (Comment)    Gait Pattern Step-to pattern;Step-through pattern;Decreased step length - right;Decreased step length - left;Narrow base of support    Ambulation Surface Level;Indoor      Neuro Re-ed    Neuro Re-ed Details  for balance/standing tolerance: completed standing with BUE support from RW and use of hand grip completed standing toe taps  to colored target x 7 reps bilaterally, cues for placement of RLE with returning to baseline position due to coordination impairment. Second set completed x 5 reps. Completed standing tolerance activity completing bean bag toss 2 x 10 toss (standing 1 min, 43 secs 1st trial, then 2nd trial 1 min, 52 secs). Patient tolerating standing without UE support and use LUE to toss bean bag. CGA throughout.                PT Short Term Goals - 10/08/20 1342  PT SHORT TERM GOAL #1   Title = LTGs               PT Long Term Goals - 10/08/20 1342       PT LONG TERM GOAL #1   Title Patient will be independent with final HEP for strength/balance with family member assistance (All LTGs Due: 11/13/20)    Baseline daughter/paitent reports independence with HEP, completing daily. will continue to benefit from progressive HEP    Time 4    Period Weeks    Status Revised      PT LONG TERM GOAL #2   Title Patient will be able to complete 5x sit <> stand in </= 20 seconds with CGA to demo improved balance and functional mobility    Baseline unable to complete; 22.53 seconds on 6/22    Time 4    Period Weeks    Status Revised      PT LONG TERM GOAL #3   Title Patient will demo ability to complete transfers from w/c <> mat with RW and supervision    Baseline CGA - Min A; CGA required with RW    Time 4    Period Weeks    Status Revised      PT LONG TERM GOAL #4   Title Patient will be able to stand >/= 2 minutes without UE support to improve tolerance for ADLs    Baseline < 30 seconds; 1 minute, 38 seconds on 7/7    Time 4    Period Weeks    Status On-going      PT LONG TERM GOAL #5   Title Patient will be able to ambulate for >/= 100 ft with LBQC vs RW for improved household mobility    Baseline 20 ft; 70 ft    Time 4    Period Weeks    Status On-going                   Plan - 10/28/20 1046     Clinical Impression Statement Continued gait training with working on  incorporating into turns to allow for improved ambulation, continue to require faciliation/cues for foot placement to promote improved base of support for stability. Continud NMR focused on standing balance and standing/activity tolerance. Will continue to progress toward all LTGs.    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, lumbar fracture December 2020    Examination-Activity Limitations Bed Mobility;Locomotion Level;Sit;Stand;Transfers    Examination-Participation Restrictions Medication Management;Community Activity;Interpersonal Relationship    Stability/Clinical Decision Making Evolving/Moderate complexity    Rehab Potential Fair    PT Frequency 2x / week    PT Duration 4 weeks    PT Treatment/Interventions ADLs/Self Care Home Management;Cryotherapy;Moist Heat;DME Instruction;Gait training;Stair training;Functional mobility training;Therapeutic activities;Therapeutic exercise;Balance training;Neuromuscular re-education;Patient/family education;Orthotic Fit/Training;Manual techniques;Wheelchair mobility training;Passive range of motion;Visual/perceptual remediation/compensation    PT Next Visit Plan Continue gait training w/ RW and hand grip attachment- does well with targets (need to problem solve to start to include turns so to increase his distance). Work on improving standing tolerance- did well with puzzles, bean bag toss, connect 4    Consulted and Agree with Plan of Care Patient;Family member/caregiver    Family Member Consulted Daughter/Son             Patient will benefit  from skilled therapeutic intervention in order to improve the following deficits and impairments:  Abnormal gait, Decreased coordination, Decreased range of motion,  Difficulty walking, Impaired tone, Pain, Impaired UE functional use, Decreased safety awareness, Decreased endurance, Decreased activity tolerance, Decreased knowledge of use of DME, Decreased balance, Decreased cognition, Decreased strength, Impaired sensation  Visit Diagnosis: Difficulty in walking, not elsewhere classified  Muscle weakness (generalized)  Other abnormalities of gait and mobility  Unsteadiness on feet     Problem List Patient Active Problem List   Diagnosis Date Noted   Rib pain on right side 03/19/2020   Sundowning 07/04/2019   Bilateral hearing loss 04/16/2019   Urinary frequency 03/21/2019   Chronic right shoulder pain 12/19/2018   Weakness generalized 09/28/2018   Palliative care encounter 09/28/2018   Pressure injury of skin 08/27/2018   COPD (chronic obstructive pulmonary disease) ([place]) 08/26/2018   AMS (altered mental status) 08/22/2018   Altered mental status 08/21/2018   UTI (urinary tract infection) 08/21/2018   Abnormality of gait 08/20/2018   Prolonged Q-T interval on ECG    Dysphagia, post-stroke    Supplemental oxygen dependent    Anxiety state    Fatigue    SOB (shortness of breath)    Hyperglycemia    History of lung cancer    Depression    CKD (chronic kidney disease), stage III ([place])    Acute blood loss anemia    Fall 07/16/2018   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   Thalamic hemorrhage ([place]) 07/16/2018   Acute spontaneous 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 ([place]) L thalamic 07/14/2018   Cellulitis of arm, right 06/01/2018   Rash and nonspecific skin eruption 06/01/2018   Chest pain 02/02/2018   Thrombocytopenia ([place]) 02/02/2018   Hypokalemia 02/02/2018   Pulmonary embolism ([place]) 02/02/2018   Elevated troponin 02/02/2018   Pulmonary embolus ([place]) 02/02/2018   Non-ST elevation MI (NSTEMI) ([place]) 19/75/8832   Diastolic CHF ([place]) 54/98/2641   Chest pain due to CAD ([place]) 06/26/2017   Dyspnea  06/21/2017   Acute bronchitis 05/15/2017   Oral candidiasis 05/15/2017   Acute pericarditis    Chest pain at rest 12/20/2016   Angina pectoris ([place]) 12/20/2016   Radiation pneumonitis ([place]) 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   Pleurisy 10/11/2016   Chest wall pain 10/11/2016   Atrial fibrillation ([place]) 05/21/2016   Adenocarcinoma of right lung ([place]) 01/06/2016   GAD (generalized anxiety disorder) 11/20/2015   Essential hypertension    Cellulitis 09/04/2015   Cellulitis of left axilla    Esophageal ulcer without bleeding    Bilateral lower extremity edema 04/28/2014   BPH (benign prostatic hyperplasia) 10/08/2012   Anxiety    EKG abnormalities 09/05/2012   Tremor 09/05/2012   Chronic fatigue 09/05/2012   Arthritis    Asthma, chronic    Reflux esophagitis    S/P CABG (coronary artery bypass graft)    GERD (gastroesophageal reflux disease)    Hyperlipidemia    Hypertension    UNSPECIFIED PERIPHERAL VASCULAR DISEASE 06/16/2009    Jones Bales, PT, DPT 10/28/2020, 10:50 AM  [facility] [address] [place], [place], [address] Phone: [phone]   Fax:  [phone]  Name: Evan [name] MRN: [id number] Date of Birth: [birth year]

## 2020-10-29 NOTE — Therapy (Signed)
[facility] [address] [place], [place], [address] Phone: [phone]   Fax:  [phone]  Occupational Therapy Treatment  Patient Details  Name: Evan [name] MRN: [id number] Date of Birth: 08-12-1927 Referring Provider (OT): Dr. Jamse Arn   Encounter Date: 10/28/2020   OT End of Session - 10/29/20 1226     Visit Number 13    Number of Visits 17    Date for OT Re-Evaluation 75/64/33   cert end date   Authorization Type [employer] Medicare    Authorization - Visit Number 59    Authorization - Number of Visits 20    OT Start Time [phone]    OT Stop Time 0930    OT Time Calculation (min) 38 min    Activity Tolerance Patient tolerated treatment well    Behavior During Therapy [facility] for tasks assessed/performed             Past Medical History:  Diagnosis Date   Adenocarcinoma of right lung ([place]) 01/06/2016   Anginal chest pain at rest [facility])    Chronic non, controlled on Lorazepam   Anxiety    Arthritis    "all over"    BPH (benign prostatic hyperplasia)    CAD (coronary artery disease)    Chronic lower back pain    Complication of anesthesia    "problems making water afterwards"   Depression    DJD (degenerative joint disease)    Esophageal ulcer without bleeding    bid ppi indefinitely   Family history of adverse reaction to anesthesia    "children w/PONV"   GERD (gastroesophageal reflux disease)    Headache    "couple/week maybe" (09/04/2015)   Hemochromatosis    Possible, elevated iron stores, hook-like osteophytes on hand films, normal LFTs   Hepatitis    "yellow jaundice as a baby"   History of ASCVD    MULTIVESSEL   Hyperlipidemia    Hypertension    Osteoarthritis    Pneumonia    "several times; got a little now" (09/04/2015)   Rheumatoid arthritis ([place])    Subdural hematoma ([place])    small after fall 09/2016-plavix held, neurosurgery consulted.  Asymptomatic.     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[place])  02/2018   on Lovenox lifelong since he failed PO Eliquis    Past Surgical History:  Procedure Laterality Date   ARM SKIN LESION BIOPSY / EXCISION Left 09/02/2015   CATARACT EXTRACTION W/ INTRAOCULAR LENS  IMPLANT, BILATERAL Bilateral    CHOLECYSTECTOMY OPEN     CORNEAL TRANSPLANT Bilateral    "one at [facility]; one at [facility]"   [place] W/ STENT     DE stent ostium into the right radial free graft at OM-, 02-2007   ESOPHAGOGASTRODUODENOSCOPY N/A 11/09/2014   Procedure: ESOPHAGOGASTRODUODENOSCOPY (EGD);  Surgeon: [name], MD;  Location: [facility] ENDOSCOPY;  Service: Endoscopy;  Laterality: N/A;   INGUINAL HERNIA REPAIR     JOINT REPLACEMENT     LEFT HEART CATH AND CORS/GRAFTS ANGIOGRAPHY N/A 06/27/2017   Procedure: LEFT HEART CATH AND CORS/GRAFTS ANGIOGRAPHY;  Surgeon: [name], MD;  Location: [facility] CV LAB;  Service: Cardiovascular;  Laterality: N/A;   NASAL SINUS SURGERY     SHOULDER OPEN ROTATOR CUFF REPAIR Right    TOTAL KNEE ARTHROPLASTY Bilateral     There were no vitals filed for this visit.  Subjective Assessment - 10/29/20 1225     Pertinent History hx of Thalamic hemorrhage. PMH: HOH, CAD, RA, Hx of R RTC repair, hx of bilateral knee arthroplasty, hx of CABG, hx of aortic andurysm repair, hx of corneal transplant, hx of chronic low back pain, depression, R lung adenocarcinoma, posssible radiation (not active) pneumonitis, A fib, hx of DVT/PE--on treatment dose Lovenox, SDH 09/2016, chronic thrombocytopenia    Patient Stated Goals incr RUE functional use    Currently in Pain? No/denies                 Treatment:Cane exercises for chest press, shoulder flexion and abduction, min v.c and min facilitation, increased time and several rest breaks.  Removing cylinder/wooden pegs with min-mod difficulty and multiple rest breaks, min cueing for slow movements/normal  movement patterns.   Pt transferred stand pivot to arm bike with min A  Arm bike x 6 mins level 1 for reciprocal movement, hand was wrapped at end of performance due to ataxia as hand slipped off multiple times.                        OT Short Term Goals - 09/29/20 0936       OT SHORT TERM GOAL #1   Title Pt/family will be independent with HEP for incr RUE functional use.--check STGs 09/24/20    Time 4    Period Weeks    Status Achieved      OT SHORT TERM GOAL #2   Title Pt will improve LUE coordination and low level functional reaching as shown by ability to grasp/release cylinder object in at least 3/5 trials.    Time 4    Period Weeks    Status On-going   09/29/20.  needs min A     OT SHORT TERM GOAL #3   Title Pt will be able to pick up 1" object using pinch at least 2/5 trials for incr coordination for ADLs.    Time 4    Period Weeks    Status Achieved      OT SHORT TERM GOAL #4   Title --      OT SHORT TERM GOAL #5   Title --               OT Long Term Goals - 10/29/20 1230       OT LONG TERM GOAL #1   Title Pt will incorporate RUE functionally in at least 3 additional ADL tasks consistently.    Baseline currently only using to push up/reach back for w/c and lock w/c brake with cues    Time 8    Period Weeks    Status On-going      OT LONG TERM GOAL #2   Title Pt will improve RUE coordination as shown by improving score on box and blocks test by at least 4.    Baseline 4 blocks.    Time 8    Period Weeks    Status On-going   10/22/20 - 7 blocks w/ RUE     OT LONG TERM GOAL #3   Title Pt will improve LUE coordination and functional reaching to midlevel as shown by ability to grasp/release cylinder object in at least 3/5 trials.    Time 8    Period Weeks    Status On-going      OT LONG TERM GOAL #4   Title Pt will be able to pick up 1" object using  pinch and place in bowl at least 4/5 trials for incr coordination for ADLs.    Time 8     Period Weeks    Status On-going                   Plan - 10/29/20 1229     Clinical Impression Statement Pt is progressing towards goals. He demonstrates improved overall controll in RUE.    OT Occupational Profile and History Detailed Assessment- Review of Records and additional review of physical, cognitive, psychosocial history related to current functional performance    Occupational performance deficits (Please refer to evaluation for details): ADL's;IADL's    Body Structure / Function / Physical Skills ADL;Decreased knowledge of use of DME;Strength;UE functional use;ROM;FMC;GMC;Dexterity;Mobility;Coordination    Rehab Potential Fair    Modification or Assistance to Complete Evaluation  Min-Moderate modification of tasks or assist with assess necessary to complete eval    OT Frequency 2x / week    OT Duration 8 weeks    OT Treatment/Interventions Moist Heat;Fluidtherapy;Self-care/ADL training;DME and/or AE instruction;Splinting;Therapeutic activities;Therapeutic exercise;Neuromuscular education;Functional Mobility Training;Passive range of motion;Patient/family education;Manual Therapy    Plan check progress towards goals, continue RUE functional use, emphasis on slow/controlled movements, light wt. bearing; try using BUEs for simulated pulling up pants    OT Home Exercise Plan Pt has been doing shoulder flex with cane and theraband exercises (shoulder ext, scapular retraction, horizontal abduction) per dtr--instructed to hold on theraband exercises due to decr control RUE    Consulted and Agree with Plan of Care Patient;Family member/caregiver    Family Member Consulted daughter             Patient will benefit from skilled therapeutic intervention in order to improve the following deficits and impairments:   Body Structure / Function / Physical Skills: ADL, Decreased knowledge of use of DME, Strength, UE functional use, ROM, FMC, GMC, Dexterity, Mobility,  Coordination       Visit Diagnosis: Muscle weakness (generalized)  Unsteadiness on feet  Other symptoms and signs involving the musculoskeletal system  Other lack of coordination  Other abnormalities of gait and mobility    Problem List Patient Active Problem List   Diagnosis Date Noted   Rib pain on right side 03/19/2020   Sundowning 07/04/2019   Bilateral hearing loss 04/16/2019   Urinary frequency 03/21/2019   Chronic right shoulder pain 12/19/2018   Weakness generalized 09/28/2018   Palliative care encounter 09/28/2018   Pressure injury of skin 08/27/2018   COPD (chronic obstructive pulmonary disease) ([place]) 08/26/2018   AMS (altered mental status) 08/22/2018   Altered mental status 08/21/2018   UTI (urinary tract infection) 08/21/2018   Abnormality of gait 08/20/2018   Prolonged Q-T interval on ECG    Dysphagia, post-stroke    Supplemental oxygen dependent    Anxiety state    Fatigue    SOB (shortness of breath)    Hyperglycemia    History of lung cancer    Depression    CKD (chronic kidney disease), stage III ([place])    Acute blood loss anemia    Fall 07/16/2018   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   Acute spontaneous 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 ([place]) L thalamic 07/14/2018   Cellulitis of arm, right 06/01/2018   Rash and nonspecific skin eruption 06/01/2018   Chest pain 02/02/2018   Thrombocytopenia ([place]) 02/02/2018   Hypokalemia 02/02/2018   Pulmonary embolism ([place]) 02/02/2018   Elevated troponin 02/02/2018  Pulmonary embolus ([place]) 02/02/2018   Non-ST elevation MI (NSTEMI) ([place]) 57/97/2820   Diastolic CHF ([place]) 60/15/6153   Chest pain due to CAD ([place]) 06/26/2017   Dyspnea 06/21/2017   Acute bronchitis 05/15/2017   Oral candidiasis 05/15/2017   Acute pericarditis    Chest pain at rest 12/20/2016   Angina pectoris ([place]) 12/20/2016   Radiation pneumonitis ([place])  11/09/2016   Pleurisy 10/11/2016   Chest wall pain 10/11/2016   Atrial fibrillation ([place]) 05/21/2016   Adenocarcinoma of right lung ([place]) 01/06/2016   GAD (generalized anxiety disorder) 11/20/2015   Essential hypertension    Cellulitis 09/04/2015   Cellulitis of left axilla    Esophageal ulcer without bleeding    Bilateral lower extremity edema 04/28/2014   BPH (benign prostatic hyperplasia) 10/08/2012   Anxiety    EKG abnormalities 09/05/2012   Tremor 09/05/2012   Chronic fatigue 09/05/2012   Arthritis    Asthma, chronic    Reflux esophagitis    S/P CABG (coronary artery bypass graft)    GERD (gastroesophageal reflux disease)    Hyperlipidemia    Hypertension    UNSPECIFIED PERIPHERAL VASCULAR DISEASE 06/16/2009    Navon Kotowski 10/29/2020, 12:31 PM  [facility] [address] [name], [place], [address] Phone: [phone]   Fax:  [phone]  Name: Evan [name] MRN: [id number] Date of Birth: [birth year]

## 2020-10-30 ENCOUNTER — Ambulatory Visit: Payer: Medicare HMO | Admitting: Occupational Therapy

## 2020-10-30 ENCOUNTER — Other Ambulatory Visit: Payer: Self-pay

## 2020-10-30 DIAGNOSIS — R29818 Other symptoms and signs involving the nervous system: Secondary | ICD-10-CM | POA: Diagnosis not present

## 2020-10-30 DIAGNOSIS — R29898 Other symptoms and signs involving the musculoskeletal system: Secondary | ICD-10-CM

## 2020-10-30 DIAGNOSIS — R2689 Other abnormalities of gait and mobility: Secondary | ICD-10-CM | POA: Diagnosis not present

## 2020-10-30 DIAGNOSIS — R2681 Unsteadiness on feet: Secondary | ICD-10-CM | POA: Diagnosis not present

## 2020-10-30 DIAGNOSIS — R270 Ataxia, unspecified: Secondary | ICD-10-CM | POA: Diagnosis not present

## 2020-10-30 DIAGNOSIS — M6281 Muscle weakness (generalized): Secondary | ICD-10-CM

## 2020-10-30 DIAGNOSIS — R278 Other lack of coordination: Secondary | ICD-10-CM | POA: Diagnosis not present

## 2020-10-30 DIAGNOSIS — R262 Difficulty in walking, not elsewhere classified: Secondary | ICD-10-CM | POA: Diagnosis not present

## 2020-10-30 NOTE — Therapy (Signed)
[facility] [address] [place], [place], [address] Phone: [phone]   Fax:  [phone]  Occupational Therapy Treatment  Patient Details  Name: Evan [name] MRN: [id number] Date of Birth: Aug 19, 1927 Referring Provider (OT): Dr. Jamse Arn   Encounter Date: 10/30/2020   OT End of Session - 10/30/20 1424     Visit Number 14    Number of Visits 17    Date for OT Re-Evaluation 11/24/20    Authorization Type [employer]    Authorization - Visit Number 14    Authorization - Number of Visits 20    OT Start Time [phone]    OT Stop Time 1015    OT Time Calculation (min) 39 min    Activity Tolerance Patient tolerated treatment well             Past Medical History:  Diagnosis Date   Adenocarcinoma of right lung ([place]) 01/06/2016   Anginal chest pain at rest [facility])    Chronic non, controlled on Lorazepam   Anxiety    Arthritis    "all over"    BPH (benign prostatic hyperplasia)    CAD (coronary artery disease)    Chronic lower back pain    Complication of anesthesia    "problems making water afterwards"   Depression    DJD (degenerative joint disease)    Esophageal ulcer without bleeding    bid ppi indefinitely   Family history of adverse reaction to anesthesia    "children w/PONV"   GERD (gastroesophageal reflux disease)    Headache    "couple/week maybe" (09/04/2015)   Hemochromatosis    Possible, elevated iron stores, hook-like osteophytes on hand films, normal LFTs   Hepatitis    "yellow jaundice as a baby"   History of ASCVD    MULTIVESSEL   Hyperlipidemia    Hypertension    Osteoarthritis    Pneumonia    "several times; got a little now" (09/04/2015)   Rheumatoid arthritis ([place])    Subdural hematoma ([place])    small after fall 09/2016-plavix held, neurosurgery consulted.  Asymptomatic.     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[place]) 02/2018   on Lovenox lifelong since he failed PO Eliquis    Past Surgical  History:  Procedure Laterality Date   ARM SKIN LESION BIOPSY / EXCISION Left 09/02/2015   CATARACT EXTRACTION W/ INTRAOCULAR LENS  IMPLANT, BILATERAL Bilateral    CHOLECYSTECTOMY OPEN     CORNEAL TRANSPLANT Bilateral    "one at [facility]; one at [facility]"   [place] W/ STENT     DE stent ostium into the right radial free graft at OM-, 02-2007   ESOPHAGOGASTRODUODENOSCOPY N/A 11/09/2014   Procedure: ESOPHAGOGASTRODUODENOSCOPY (EGD);  Surgeon: [name], MD;  Location: [facility] ENDOSCOPY;  Service: Endoscopy;  Laterality: N/A;   INGUINAL HERNIA REPAIR     JOINT REPLACEMENT     LEFT HEART CATH AND CORS/GRAFTS ANGIOGRAPHY N/A 06/27/2017   Procedure: LEFT HEART CATH AND CORS/GRAFTS ANGIOGRAPHY;  Surgeon: [name], MD;  Location: [facility] CV LAB;  Service: Cardiovascular;  Laterality: N/A;   NASAL SINUS SURGERY     SHOULDER OPEN ROTATOR CUFF REPAIR Right    TOTAL KNEE ARTHROPLASTY Bilateral     There were no vitals filed for this visit.   Subjective Assessment - 10/30/20 1424     Subjective  No reports of  pain    Pertinent History hx of Thalamic hemorrhage. PMH: HOH, CAD, RA, Hx of R RTC repair, hx of bilateral knee arthroplasty, hx of CABG, hx of aortic andurysm repair, hx of corneal transplant, hx of chronic low back pain, depression, R lung adenocarcinoma, posssible radiation (not active) pneumonitis, A fib, hx of DVT/PE--on treatment dose Lovenox, SDH 09/2016, chronic thrombocytopenia    Patient Stated Goals incr RUE functional use    Currently in Pain? No/denies                 Treatment:Cane exercises for chest press, shoulder flexion and abduction, min v.c and min facilitation, increased time  and multiple rest breaks  Removing large and small from semi circle pegs with min-mod difficulty and multiple rest breaks, min cueing for slow movements/normal movement patterns.    Table slides for shoulder flexion and abduction, min facilitation/ v.c  Standing at sink for weightbearing through UE's x 3 trials ( pt was only able to stand for 30 secs then rest, min-mod A                   OT Short Term Goals - 10/30/20 1000       OT SHORT TERM GOAL #1   Title Pt/family will be independent with HEP for incr RUE functional use.--check STGs 09/24/20    Time 4    Period Weeks    Status Achieved      OT SHORT TERM GOAL #2   Title Pt will improve LUE coordination and low level functional reaching as shown by ability to grasp/release cylinder object in at least 3/5 trials.    Time 4    Period Weeks    Status Achieved      OT SHORT TERM GOAL #3   Title Pt will be able to pick up 1" object using pinch at least 2/5 trials for incr coordination for ADLs.    Time 4    Period Weeks    Status Achieved      OT SHORT TERM GOAL #4   Title --      OT SHORT TERM GOAL #5   Title --               OT Long Term Goals - 10/30/20 0943       OT LONG TERM GOAL #1   Title Pt will incorporate RUE functionally in at least 3 additional ADL tasks consistently.    Baseline currently only using to push up/reach back for w/c and lock w/c brake with cues    Time 8    Period Weeks    Status Achieved      OT LONG TERM GOAL #2   Title Pt will improve RUE coordination as shown by improving score on box and blocks test by at least 4.    Baseline 4 blocks.    Time 8    Period Weeks    Status Achieved   10/30/20- 9 BLOCKS     OT LONG TERM GOAL #3   Title Pt will improve LUE coordination and functional reaching to midlevel as shown by ability to grasp/release cylinder object in at least 3/5 trials.    Time 8    Period Weeks    Status On-going      OT LONG TERM GOAL #4   Title Pt will be able to pick up 1" object using pinch and place in bowl at least 4/5 trials for incr coordination for ADLs.  Time 8    Period Weeks    Status On-going                     Patient will benefit from skilled therapeutic intervention in order to improve the following deficits and impairments:           Visit Diagnosis: Muscle weakness (generalized)  Unsteadiness on feet  Other symptoms and signs involving the musculoskeletal system  Other lack of coordination  Other abnormalities of gait and mobility  Other symptoms and signs involving the nervous system    Problem List Patient Active Problem List   Diagnosis Date Noted   Rib pain on right side 03/19/2020   Sundowning 07/04/2019   Bilateral hearing loss 04/16/2019   Urinary frequency 03/21/2019   Chronic right shoulder pain 12/19/2018   Weakness generalized 09/28/2018   Palliative care encounter 09/28/2018   Pressure injury of skin 08/27/2018   COPD (chronic obstructive pulmonary disease) ([place]) 08/26/2018   AMS (altered mental status) 08/22/2018   Altered mental status 08/21/2018   UTI (urinary tract infection) 08/21/2018   Abnormality of gait 08/20/2018   Prolonged Q-T interval on ECG    Dysphagia, post-stroke    Supplemental oxygen dependent    Anxiety state    Fatigue    SOB (shortness of breath)    Hyperglycemia    History of lung cancer    Depression    CKD (chronic kidney disease), stage III ([place])    Acute blood loss anemia    Fall 07/16/2018   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   Acute spontaneous 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 ([place]) L thalamic 07/14/2018   Cellulitis of arm, right 06/01/2018   Rash and nonspecific skin eruption 06/01/2018   Chest pain 02/02/2018   Thrombocytopenia ([place]) 02/02/2018   Hypokalemia 02/02/2018   Pulmonary embolism ([place]) 02/02/2018   Elevated troponin 02/02/2018   Pulmonary embolus ([place]) 02/02/2018   Non-ST elevation MI (NSTEMI) ([place]) 24/23/5361   Diastolic CHF ([place]) 44/31/5400   Chest pain due to CAD ([place]) 06/26/2017   Dyspnea 06/21/2017    Acute bronchitis 05/15/2017   Oral candidiasis 05/15/2017   Acute pericarditis    Chest pain at rest 12/20/2016   Angina pectoris ([place]) 12/20/2016   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   Pleurisy 10/11/2016   Chest wall pain 10/11/2016   Atrial fibrillation ([place]) 05/21/2016   Adenocarcinoma of right lung ([place]) 01/06/2016   GAD (generalized anxiety disorder) 11/20/2015   Essential hypertension    Cellulitis 09/04/2015   Cellulitis of left axilla    Esophageal ulcer without bleeding    Bilateral lower extremity edema 04/28/2014   BPH (benign prostatic hyperplasia) 10/08/2012   Anxiety    EKG abnormalities 09/05/2012   Tremor 09/05/2012   Chronic fatigue 09/05/2012   Arthritis    Asthma, chronic    Reflux esophagitis    S/P CABG (coronary artery bypass graft)    GERD (gastroesophageal reflux disease)    Hyperlipidemia    Hypertension    UNSPECIFIED PERIPHERAL VASCULAR DISEASE 06/16/2009    Jamina Macbeth 10/30/2020, 2:25 PM  [facility] [address] [place], [place], [address] Phone: [phone]   Fax:  [phone]  Name: QUINDARRIUS JOPLIN MRN: [id number] Date of Birth: 05-02-1927

## 2020-11-04 ENCOUNTER — Other Ambulatory Visit: Payer: Self-pay

## 2020-11-04 ENCOUNTER — Ambulatory Visit: Payer: Medicare HMO | Attending: Physical Medicine & Rehabilitation | Admitting: Occupational Therapy

## 2020-11-04 DIAGNOSIS — R29898 Other symptoms and signs involving the musculoskeletal system: Secondary | ICD-10-CM | POA: Diagnosis not present

## 2020-11-04 DIAGNOSIS — R262 Difficulty in walking, not elsewhere classified: Secondary | ICD-10-CM | POA: Diagnosis not present

## 2020-11-04 DIAGNOSIS — R2689 Other abnormalities of gait and mobility: Secondary | ICD-10-CM | POA: Insufficient documentation

## 2020-11-04 DIAGNOSIS — R278 Other lack of coordination: Secondary | ICD-10-CM | POA: Diagnosis not present

## 2020-11-04 DIAGNOSIS — R29818 Other symptoms and signs involving the nervous system: Secondary | ICD-10-CM | POA: Diagnosis not present

## 2020-11-04 DIAGNOSIS — R270 Ataxia, unspecified: Secondary | ICD-10-CM | POA: Insufficient documentation

## 2020-11-04 DIAGNOSIS — M6281 Muscle weakness (generalized): Secondary | ICD-10-CM | POA: Diagnosis not present

## 2020-11-04 DIAGNOSIS — R2681 Unsteadiness on feet: Secondary | ICD-10-CM | POA: Insufficient documentation

## 2020-11-04 NOTE — Therapy (Signed)
[facility] [address] [place], [place], [address] Phone: [phone]   Fax:  [phone]  Occupational Therapy Treatment  Patient Details  Name: Evan [name] MRN: [id number] Date of Birth: [birth year] Referring Provider (OT): Dr. Jamse Arn   Encounter Date: 11/04/2020   OT End of Session - 11/04/20 1233     Visit Number 15    Number of Visits 17    Date for OT Re-Evaluation 11/24/20    Authorization Type [employer]    Authorization - Visit Number 14    Authorization - Number of Visits 20    OT Start Time 1104    OT Stop Time 1144    OT Time Calculation (min) 40 min    Activity Tolerance Patient tolerated treatment well             Past Medical History:  Diagnosis Date   Adenocarcinoma of right lung ([place]) 01/06/2016   Anginal chest pain at rest [facility])    Chronic non, controlled on Lorazepam   Anxiety    Arthritis    "all over"    BPH (benign prostatic hyperplasia)    CAD (coronary artery disease)    Chronic lower back pain    Complication of anesthesia    "problems making water afterwards"   Depression    DJD (degenerative joint disease)    Esophageal ulcer without bleeding    bid ppi indefinitely   Family history of adverse reaction to anesthesia    "children w/PONV"   GERD (gastroesophageal reflux disease)    Headache    "couple/week maybe" (09/04/2015)   Hemochromatosis    Possible, elevated iron stores, hook-like osteophytes on hand films, normal LFTs   Hepatitis    "yellow jaundice as a baby"   History of ASCVD    MULTIVESSEL   Hyperlipidemia    Hypertension    Osteoarthritis    Pneumonia    "several times; got a little now" (09/04/2015)   Rheumatoid arthritis ([place])    Subdural hematoma ([place])    small after fall 09/2016-plavix held, neurosurgery consulted.  Asymptomatic.     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[place]) 02/2018   on Lovenox lifelong since he failed PO Eliquis    Past Surgical  History:  Procedure Laterality Date   ARM SKIN LESION BIOPSY / EXCISION Left 09/02/2015   CATARACT EXTRACTION W/ INTRAOCULAR LENS  IMPLANT, BILATERAL Bilateral    CHOLECYSTECTOMY OPEN     CORNEAL TRANSPLANT Bilateral    "one at [facility]; one at [facility]"   [place] W/ STENT     DE stent ostium into the right radial free graft at OM-, 02-2007   ESOPHAGOGASTRODUODENOSCOPY N/A 11/09/2014   Procedure: ESOPHAGOGASTRODUODENOSCOPY (EGD);  Surgeon: [name], MD;  Location: [facility] ENDOSCOPY;  Service: Endoscopy;  Laterality: N/A;   INGUINAL HERNIA REPAIR     JOINT REPLACEMENT     LEFT HEART CATH AND CORS/GRAFTS ANGIOGRAPHY N/A 06/27/2017   Procedure: LEFT HEART CATH AND CORS/GRAFTS ANGIOGRAPHY;  Surgeon: [name], MD;  Location: [facility] CV LAB;  Service: Cardiovascular;  Laterality: N/A;   NASAL SINUS SURGERY     SHOULDER OPEN ROTATOR CUFF REPAIR Right    TOTAL KNEE ARTHROPLASTY Bilateral     There were no vitals filed for this visit.   Subjective Assessment - 11/04/20 1239     Subjective  No reports of  pain    Pertinent History hx of Thalamic hemorrhage. PMH: HOH, CAD, RA, Hx of R RTC repair, hx of bilateral knee arthroplasty, hx of CABG, hx of aortic andurysm repair, hx of corneal transplant, hx of chronic low back pain, depression, R lung adenocarcinoma, posssible radiation (not active) pneumonitis, A fib, hx of DVT/PE--on treatment dose Lovenox, SDH 09/2016, chronic thrombocytopenia    Patient Stated Goals incr RUE functional use    Currently in Pain? No/denies                   Treatment: Folding towel with bilateral UE's, min -mod facilitation.  Picking up blocks to place in container, mod difficulty, v.c for slower movements.  Arm bike x 4 mins with RUE wrapped,( 2 mins then rest then pt completed another 2 mins) for increased reciprocal movement  Sliding large  playing cards along tabletop with RUE, mod difficulty/ v.c  Table slides for shoulder flexion and abduction, min facilitation/ v.c  Standing at sink for weightbearing through UE's x 2 trials ( pt was only able to stand for 30 secs then rest, min A.                OT Short Term Goals - 10/30/20 1000       OT SHORT TERM GOAL #1   Title Pt/family will be independent with HEP for incr RUE functional use.--check STGs 09/24/20    Time 4    Period Weeks    Status Achieved      OT SHORT TERM GOAL #2   Title Pt will improve LUE coordination and low level functional reaching as shown by ability to grasp/release cylinder object in at least 3/5 trials.    Time 4    Period Weeks    Status Achieved      OT SHORT TERM GOAL #3   Title Pt will be able to pick up 1" object using pinch at least 2/5 trials for incr coordination for ADLs.    Time 4    Period Weeks    Status Achieved      OT SHORT TERM GOAL #4   Title --      OT SHORT TERM GOAL #5   Title --               OT Long Term Goals - 10/30/20 0943       OT LONG TERM GOAL #1   Title Pt will incorporate RUE functionally in at least 3 additional ADL tasks consistently.    Baseline currently only using to push up/reach back for w/c and lock w/c brake with cues    Time 8    Period Weeks    Status Achieved      OT LONG TERM GOAL #2   Title Pt will improve RUE coordination as shown by improving score on box and blocks test by at least 4.    Baseline 4 blocks.    Time 8    Period Weeks    Status Achieved   10/30/20- 9 BLOCKS     OT LONG TERM GOAL #3   Title Pt will improve LUE coordination and functional reaching to midlevel as shown by ability to grasp/release cylinder object in at least 3/5 trials.    Time 8    Period Weeks    Status On-going      OT LONG TERM GOAL #4   Title Pt will be able to pick up 1" object using pinch and place  in bowl at least 4/5 trials for incr coordination for ADLs.    Time 8     Period Weeks    Status On-going                   Plan - 11/04/20 1235     Clinical Impression Statement Pt is progressing towards goals. Pt has met several long term goals. Anticipate d/c next week.    OT Occupational Profile and History Detailed Assessment- Review of Records and additional review of physical, cognitive, psychosocial history related to current functional performance    Occupational performance deficits (Please refer to evaluation for details): ADL's;IADL's    Body Structure / Function / Physical Skills ADL;Decreased knowledge of use of DME;Strength;UE functional use;ROM;FMC;GMC;Dexterity;Mobility;Coordination    Rehab Potential Fair    Modification or Assistance to Complete Evaluation  Min-Moderate modification of tasks or assist with assess necessary to complete eval    OT Frequency 2x / week    OT Duration 8 weeks    OT Treatment/Interventions Moist Heat;Fluidtherapy;Self-care/ADL training;DME and/or AE instruction;Splinting;Therapeutic activities;Therapeutic exercise;Neuromuscular education;Functional Mobility Training;Passive range of motion;Patient/family education;Manual Therapy    Plan functional use of RUE, light wt. bearing; try using BUEs for simulated pulling up pants    OT Home Exercise Plan Pt has been doing shoulder flex with cane and theraband exercises (shoulder ext, scapular retraction, horizontal abduction) per dtr--instructed to hold on theraband exercises due to decr control RUE    Consulted and Agree with Plan of Care Patient;Family member/caregiver    Family Member Consulted daughter             Patient will benefit from skilled therapeutic intervention in order to improve the following deficits and impairments:   Body Structure / Function / Physical Skills: ADL, Decreased knowledge of use of DME, Strength, UE functional use, ROM, FMC, GMC, Dexterity, Mobility, Coordination       Visit Diagnosis: Muscle weakness (generalized)  Other  lack of coordination  Other symptoms and signs involving the musculoskeletal system  Other abnormalities of gait and mobility  Other symptoms and signs involving the nervous system    Problem List Patient Active Problem List   Diagnosis Date Noted   Rib pain on right side 03/19/2020   Sundowning 07/04/2019   Bilateral hearing loss 04/16/2019   Urinary frequency 03/21/2019   Chronic right shoulder pain 12/19/2018   Weakness generalized 09/28/2018   Palliative care encounter 09/28/2018   Pressure injury of skin 08/27/2018   COPD (chronic obstructive pulmonary disease) ([place]) 08/26/2018   AMS (altered mental status) 08/22/2018   Altered mental status 08/21/2018   UTI (urinary tract infection) 08/21/2018   Abnormality of gait 08/20/2018   Prolonged Q-T interval on ECG    Dysphagia, post-stroke    Supplemental oxygen dependent    Anxiety state    Fatigue    SOB (shortness of breath)    Hyperglycemia    History of lung cancer    Depression    CKD (chronic kidney disease), stage III ([place])    Acute blood loss anemia    Fall 07/16/2018   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   Acute spontaneous 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 ([place]) L thalamic 07/14/2018   Cellulitis of arm, right 06/01/2018   Rash and nonspecific skin eruption 06/01/2018   Chest pain 02/02/2018   Thrombocytopenia ([place]) 02/02/2018   Hypokalemia 02/02/2018   Pulmonary embolism ([place]) 02/02/2018   Elevated troponin 02/02/2018   Pulmonary  embolus ([place]) 02/02/2018   Non-ST elevation MI (NSTEMI) ([place]) 46/07/7996   Diastolic CHF ([place]) 72/15/8727   Chest pain due to CAD ([place]) 06/26/2017   Dyspnea 06/21/2017   Acute bronchitis 05/15/2017   Oral candidiasis 05/15/2017   Acute pericarditis    Chest pain at rest 12/20/2016   Angina pectoris ([place]) 12/20/2016   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   Pleurisy 10/11/2016   Chest wall pain  10/11/2016   Atrial fibrillation ([place]) 05/21/2016   Adenocarcinoma of right lung ([place]) 01/06/2016   GAD (generalized anxiety disorder) 11/20/2015   Essential hypertension    Cellulitis 09/04/2015   Cellulitis of left axilla    Esophageal ulcer without bleeding    Bilateral lower extremity edema 04/28/2014   BPH (benign prostatic hyperplasia) 10/08/2012   Anxiety    EKG abnormalities 09/05/2012   Tremor 09/05/2012   Chronic fatigue 09/05/2012   Arthritis    Asthma, chronic    Reflux esophagitis    S/P CABG (coronary artery bypass graft)    GERD (gastroesophageal reflux disease)    Hyperlipidemia    Hypertension    UNSPECIFIED PERIPHERAL VASCULAR DISEASE 06/16/2009    Chenika Nevils 11/04/2020, 12:40 PM  [facility] [address] [place], [place], [address] Phone: [phone]   Fax:  [phone]  Name: ESAIAS CLEAVENGER MRN: [id number] Date of Birth: 09/24/1927

## 2020-11-06 ENCOUNTER — Ambulatory Visit: Payer: Medicare HMO

## 2020-11-06 ENCOUNTER — Ambulatory Visit: Payer: Medicare HMO | Admitting: Occupational Therapy

## 2020-11-06 ENCOUNTER — Other Ambulatory Visit: Payer: Self-pay

## 2020-11-06 DIAGNOSIS — R29818 Other symptoms and signs involving the nervous system: Secondary | ICD-10-CM

## 2020-11-06 DIAGNOSIS — R262 Difficulty in walking, not elsewhere classified: Secondary | ICD-10-CM | POA: Diagnosis not present

## 2020-11-06 DIAGNOSIS — R2681 Unsteadiness on feet: Secondary | ICD-10-CM | POA: Diagnosis not present

## 2020-11-06 DIAGNOSIS — R2689 Other abnormalities of gait and mobility: Secondary | ICD-10-CM

## 2020-11-06 DIAGNOSIS — M6281 Muscle weakness (generalized): Secondary | ICD-10-CM

## 2020-11-06 DIAGNOSIS — R29898 Other symptoms and signs involving the musculoskeletal system: Secondary | ICD-10-CM | POA: Diagnosis not present

## 2020-11-06 DIAGNOSIS — R278 Other lack of coordination: Secondary | ICD-10-CM

## 2020-11-06 DIAGNOSIS — R270 Ataxia, unspecified: Secondary | ICD-10-CM | POA: Diagnosis not present

## 2020-11-06 NOTE — Therapy (Signed)
[facility] [address] [place], [place], [address] Phone: [phone]   Fax:  [phone]  Occupational Therapy Treatment  Patient Details  Name: Evan [name] MRN: [id number] Date of Birth: 05-01-1927 Referring Provider (OT): Dr. Jamse Arn   Encounter Date: 11/06/2020   OT End of Session - 11/06/20 0854     Visit Number 16    Number of Visits 19    Date for OT Re-Evaluation 11/24/20    Authorization Type [employer]    Authorization Time Period requested 6 visits from 10/26/20 for [employer] update    Authorization - Visit Number 16    Authorization - Number of Visits 20    OT Start Time 0850    OT Stop Time 0930    OT Time Calculation (min) 40 min             Past Medical History:  Diagnosis Date   Adenocarcinoma of right lung ([place]) 01/06/2016   Anginal chest pain at rest [facility])    Chronic non, controlled on Lorazepam   Anxiety    Arthritis    "all over"    BPH (benign prostatic hyperplasia)    CAD (coronary artery disease)    Chronic lower back pain    Complication of anesthesia    "problems making water afterwards"   Depression    DJD (degenerative joint disease)    Esophageal ulcer without bleeding    bid ppi indefinitely   Family history of adverse reaction to anesthesia    "children w/PONV"   GERD (gastroesophageal reflux disease)    Headache    "couple/week maybe" (09/04/2015)   Hemochromatosis    Possible, elevated iron stores, hook-like osteophytes on hand films, normal LFTs   Hepatitis    "yellow jaundice as a baby"   History of ASCVD    MULTIVESSEL   Hyperlipidemia    Hypertension    Osteoarthritis    Pneumonia    "several times; got a little now" (09/04/2015)   Rheumatoid arthritis ([place])    Subdural hematoma ([place])    small after fall 09/2016-plavix held, neurosurgery consulted.  Asymptomatic.     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[place]) 02/2018   on Lovenox lifelong since he failed PO Eliquis     Past Surgical History:  Procedure Laterality Date   ARM SKIN LESION BIOPSY / EXCISION Left 09/02/2015   CATARACT EXTRACTION W/ INTRAOCULAR LENS  IMPLANT, BILATERAL Bilateral    CHOLECYSTECTOMY OPEN     CORNEAL TRANSPLANT Bilateral    "one at [facility]; one at [facility]"   [place] W/ STENT     DE stent ostium into the right radial free graft at OM-, 02-2007   ESOPHAGOGASTRODUODENOSCOPY N/A 11/09/2014   Procedure: ESOPHAGOGASTRODUODENOSCOPY (EGD);  Surgeon: [name], MD;  Location: [facility] ENDOSCOPY;  Service: Endoscopy;  Laterality: N/A;   INGUINAL HERNIA REPAIR     JOINT REPLACEMENT     LEFT HEART CATH AND CORS/GRAFTS ANGIOGRAPHY N/A 06/27/2017   Procedure: LEFT HEART CATH AND CORS/GRAFTS ANGIOGRAPHY;  Surgeon: [name], MD;  Location: [facility] CV LAB;  Service: Cardiovascular;  Laterality: N/A;   NASAL SINUS SURGERY     SHOULDER OPEN ROTATOR CUFF REPAIR Right    TOTAL KNEE ARTHROPLASTY Bilateral     There were no vitals filed for this visit.   Subjective Assessment - 11/06/20 1696  Subjective  Denies pain    Pertinent History hx of Thalamic hemorrhage. PMH: HOH, CAD, RA, Hx of R RTC repair, hx of bilateral knee arthroplasty, hx of CABG, hx of aortic andurysm repair, hx of corneal transplant, hx of chronic low back pain, depression, R lung adenocarcinoma, posssible radiation (not active) pneumonitis, A fib, hx of DVT/PE--on treatment dose Lovenox, SDH 09/2016, chronic thrombocytopenia    Patient Stated Goals incr RUE functional use    Currently in Pain? No/denies                 Treatment: Pt transferred to mat min A. Seated weightbearing through RUE along wih closed chain reaching or floor, and trunk rotation holding ball with bilateral UE's, min facilitation. Chest press and shoulder flexion closed chain .Functional grasp/ release of 1 inch blocks and cones,  min-mod difficulty and facilitation. Seated weightbearing through large physioball for shoulder flexion, min facilitation                    OT Short Term Goals - 10/30/20 1000       OT SHORT TERM GOAL #1   Title Pt/family will be independent with HEP for incr RUE functional use.--check STGs 09/24/20    Time 4    Period Weeks    Status Achieved      OT SHORT TERM GOAL #2   Title Pt will improve LUE coordination and low level functional reaching as shown by ability to grasp/release cylinder object in at least 3/5 trials.    Time 4    Period Weeks    Status Achieved      OT SHORT TERM GOAL #3   Title Pt will be able to pick up 1" object using pinch at least 2/5 trials for incr coordination for ADLs.    Time 4    Period Weeks    Status Achieved      OT SHORT TERM GOAL #4   Title --      OT SHORT TERM GOAL #5   Title --               OT Long Term Goals - 10/30/20 0943       OT LONG TERM GOAL #1   Title Pt will incorporate RUE functionally in at least 3 additional ADL tasks consistently.    Baseline currently only using to push up/reach back for w/c and lock w/c brake with cues    Time 8    Period Weeks    Status Achieved      OT LONG TERM GOAL #2   Title Pt will improve RUE coordination as shown by improving score on box and blocks test by at least 4.    Baseline 4 blocks.    Time 8    Period Weeks    Status Achieved   10/30/20- 9 BLOCKS     OT LONG TERM GOAL #3   Title Pt will improve LUE coordination and functional reaching to midlevel as shown by ability to grasp/release cylinder object in at least 3/5 trials.    Time 8    Period Weeks    Status On-going      OT LONG TERM GOAL #4   Title Pt will be able to pick up 1" object using pinch and place in bowl at least 4/5 trials for incr coordination for ADLs.    Time 8    Period Weeks    Status On-going  Plan - 11/06/20 1527     Clinical Impression Statement Pt is  progressing towards goals.  He demonstrates improved UE control. Pt can benefit from additional visits next week  to complete family education and to reinforce functional use of RUE. Pt is being renewed to cover all remaining visits in POC.   OT Occupational Profile and History Detailed Assessment- Review of Records and additional review of physical, cognitive, psychosocial history related to current functional performance    Occupational performance deficits (Please refer to evaluation for details): ADL's;IADL's    Body Structure / Function / Physical Skills ADL;Decreased knowledge of use of DME;Strength;UE functional use;ROM;FMC;GMC;Dexterity;Mobility;Coordination    Rehab Potential Fair    Modification or Assistance to Complete Evaluation  Min-Moderate modification of tasks or assist with assess necessary to complete eval    OT Frequency 2x / week    OT Duration 4 weeks    OT Treatment/Interventions Moist Heat;Fluidtherapy;Self-care/ADL training;DME and/or AE instruction;Splinting;Therapeutic activities;Therapeutic exercise;Neuromuscular education;Functional Mobility Training;Passive range of motion;Patient/family education;Manual Therapy    Plan check goals, d/c next week,functional use of RUE, light wt. bearing; try using BUEs for simulated pulling up pants    OT Home Exercise Plan Pt has been doing shoulder flex with cane and theraband exercises (shoulder ext, scapular retraction, horizontal abduction) per dtr--instructed to hold on theraband exercises due to decr control RUE    Consulted and Agree with Plan of Care Patient;Family member/caregiver    Family Member Consulted daughter             Patient will benefit from skilled therapeutic intervention in order to improve the following deficits and impairments:   Body Structure / Function / Physical Skills: ADL, Decreased knowledge of use of DME, Strength, UE functional use, ROM, FMC, GMC, Dexterity, Mobility, Coordination       Visit  Diagnosis: Muscle weakness (generalized) - Plan: Ot plan of care cert/re-cert  Other lack of coordination - Plan: Ot plan of care cert/re-cert  Other symptoms and signs involving the musculoskeletal system - Plan: Ot plan of care cert/re-cert  Other abnormalities of gait and mobility - Plan: Ot plan of care cert/re-cert  Other symptoms and signs involving the nervous system - Plan: Ot plan of care cert/re-cert    Problem List Patient Active Problem List   Diagnosis Date Noted   Rib pain on right side 03/19/2020   Sundowning 07/04/2019   Bilateral hearing loss 04/16/2019   Urinary frequency 03/21/2019   Chronic right shoulder pain 12/19/2018   Weakness generalized 09/28/2018   Palliative care encounter 09/28/2018   Pressure injury of skin 08/27/2018   COPD (chronic obstructive pulmonary disease) ([place]) 08/26/2018   AMS (altered mental status) 08/22/2018   Altered mental status 08/21/2018   UTI (urinary tract infection) 08/21/2018   Abnormality of gait 08/20/2018   Prolonged Q-T interval on ECG    Dysphagia, post-stroke    Supplemental oxygen dependent    Anxiety state    Fatigue    SOB (shortness of breath)    Hyperglycemia    History of lung cancer    Depression    CKD (chronic kidney disease), stage III (HCC)    Acute blood loss anemia    Fall 07/16/2018   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   Acute spontaneous 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 ([place]) L thalamic 07/14/2018   Cellulitis of arm, right 06/01/2018   Rash and nonspecific skin eruption 06/01/2018   Chest pain 02/02/2018   Thrombocytopenia (  [place]) 02/02/2018   Hypokalemia 02/02/2018   Pulmonary embolism ([place]) 02/02/2018   Elevated troponin 02/02/2018   Pulmonary embolus ([place]) 02/02/2018   Non-ST elevation MI (NSTEMI) ([place]) 11/94/1740   Diastolic CHF ([place]) 81/44/8185   Chest pain due to CAD ([place]) 06/26/2017   Dyspnea 06/21/2017    Acute bronchitis 05/15/2017   Oral candidiasis 05/15/2017   Acute pericarditis    Chest pain at rest 12/20/2016   Angina pectoris ([place]) 12/20/2016   Radiation pneumonitis ([place]) 11/09/2016   Pleurisy 10/11/2016   Chest wall pain 10/11/2016   Atrial fibrillation ([place]) 05/21/2016   Adenocarcinoma of right lung ([place]) 01/06/2016   GAD (generalized anxiety disorder) 11/20/2015   Essential hypertension    Cellulitis 09/04/2015   Cellulitis of left axilla    Esophageal ulcer without bleeding    Bilateral lower extremity edema 04/28/2014   BPH (benign prostatic hyperplasia) 10/08/2012   Anxiety    EKG abnormalities 09/05/2012   Tremor 09/05/2012   Chronic fatigue 09/05/2012   Arthritis    Asthma, chronic    Reflux esophagitis    S/P CABG (coronary artery bypass graft)    GERD (gastroesophageal reflux disease)    Hyperlipidemia    Hypertension    UNSPECIFIED PERIPHERAL VASCULAR DISEASE 06/16/2009    Carsyn Boster 11/06/2020, 3:37 PM Theone Murdoch, OTR/L [phone] Phone: [phone] 3:37 PM 11/06/20  [facility] [address] [place], [place], [address] Phone: [phone]   Fax:  [phone]  Name: Evan [name] MRN: [id number] Date of Birth: 01-16-1928

## 2020-11-06 NOTE — Therapy (Signed)
[facility] [address] [place], [place], [address] Phone: [phone]   Fax:  [phone]  Physical Therapy Treatment  Patient Details  Name: Evan [name] MRN: [id number] Date of Birth: [birth year] Referring Provider (PT): Delice Lesch, MD   Encounter Date: 11/06/2020   PT End of Session - 11/06/20 0939     Visit Number 13    Number of Visits 18    Date for PT Re-Evaluation 11/13/20    Authorization Type [employer] (10th Visit PN)    Authorization Time Period Awaiting Authorization Approval    PT Start Time 0935    PT Stop Time 1015    PT Time Calculation (min) 40 min    Equipment Utilized During Treatment Gait belt    Activity Tolerance Patient tolerated treatment well;Patient limited by fatigue    Behavior During Therapy [facility] for tasks assessed/performed             Past Medical History:  Diagnosis Date   Adenocarcinoma of right lung ([place]) 01/06/2016   Anginal chest pain at rest [facility])    Chronic non, controlled on Lorazepam   Anxiety    Arthritis    "all over"    BPH (benign prostatic hyperplasia)    CAD (coronary artery disease)    Chronic lower back pain    Complication of anesthesia    "problems making water afterwards"   Depression    DJD (degenerative joint disease)    Esophageal ulcer without bleeding    bid ppi indefinitely   Family history of adverse reaction to anesthesia    "children w/PONV"   GERD (gastroesophageal reflux disease)    Headache    "couple/week maybe" (09/04/2015)   Hemochromatosis    Possible, elevated iron stores, hook-like osteophytes on hand films, normal LFTs   Hepatitis    "yellow jaundice as a baby"   History of ASCVD    MULTIVESSEL   Hyperlipidemia    Hypertension    Osteoarthritis    Pneumonia    "several times; got a little now" (09/04/2015)   Rheumatoid arthritis ([place])    Subdural hematoma ([place])    small after fall 09/2016-plavix held, neurosurgery  consulted.  Asymptomatic.     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[place]) 02/2018   on Lovenox lifelong since he failed PO Eliquis    Past Surgical History:  Procedure Laterality Date   ARM SKIN LESION BIOPSY / EXCISION Left 09/02/2015   CATARACT EXTRACTION W/ INTRAOCULAR LENS  IMPLANT, BILATERAL Bilateral    CHOLECYSTECTOMY OPEN     CORNEAL TRANSPLANT Bilateral    "one at [facility]; one at [facility]"   [place] W/ STENT     DE stent ostium into the right radial free graft at [facility]-, 02-2007   ESOPHAGOGASTRODUODENOSCOPY N/A 11/09/2014   Procedure: ESOPHAGOGASTRODUODENOSCOPY (EGD);  Surgeon: [name], MD;  Location: [facility] ENDOSCOPY;  Service: Endoscopy;  Laterality: N/A;   INGUINAL HERNIA REPAIR     JOINT REPLACEMENT     LEFT HEART CATH AND CORS/GRAFTS ANGIOGRAPHY N/A 06/27/2017   Procedure: LEFT HEART CATH AND CORS/GRAFTS ANGIOGRAPHY;  Surgeon: [name], MD;  Location: [facility] CV LAB;  Service: Cardiovascular;  Laterality: N/A;   NASAL SINUS SURGERY     SHOULDER OPEN ROTATOR CUFF REPAIR Right    TOTAL KNEE ARTHROPLASTY Bilateral     There were no vitals filed for this visit.  Subjective Assessment - 11/06/20 0938     Subjective No new changes/complaints. No Falls. Reports the back has been feeling better.    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 No/denies    Pain Onset In the past 7 days              [facility] Adult PT Treatment/Exercise - 11/06/20 0001       Transfers   Transfers Sit to Stand;Stand to Sit;Stand  Pivot Transfers    Sit to Stand 4: Min guard;With upper extremity assist;From bed;From chair/3-in-1    Sit to Stand Details Verbal cues for technique;Tactile cues for sequencing;Tactile cues for weight shifting    Sit to Stand Details (indicate cue type and reason) cue sto scoot forward prior to standing, and foot position to promote improved weight bearing/alignment    Stand to Sit 4: Min guard;With upper extremity assist;To bed;To chair/3-in-1    Stand to Sit Details cues to reach back      Ambulation/Gait   Ambulation/Gait Yes    Ambulation/Gait Assistance 4: Min guard;4: Min assist    Ambulation/Gait Assistance Details completed ambulation with RW and R hand Grip attachment, with continued focus on negoitaiton around curves in therapy gym around bloe track, PT providing CGA and faciliation at hip for improved gait pattern. Completed last gait trial at end of session from mat x 57 ft. increased fatigue at end of session.    Ambulation Distance (Feet) 54 Feet   68 x 1   Assistive device Rolling walker;Other (Comment)    Gait Pattern Step-to pattern;Step-through pattern;Decreased step length - right;Decreased step length - left;Narrow base of support    Ambulation Surface Level;Indoor      Neuro Re-ed    Neuro Re-ed Details  standing with BUE support from RW completed alternating taps to 2" step for improved coordination/balance, completed biat 2 x 5 reps, intermittent rest seated rest break. Attempted without RW and min A from PT able to complete x 3 reps before fatigue needing seated rest break. Completed standing without UE support and using Jenga blocks to build tower for improved standing tolerance with addition of reaching actviity, completed 1 min 31 secs on first trial; then second trial completed for 1 min 58 secs. CGA required.                      PT Short Term Goals - 10/08/20 1342       PT SHORT TERM GOAL #1   Title = LTGs               PT Long Term Goals -  10/08/20 1342       PT LONG TERM GOAL #1   Title Patient will be independent with final HEP for strength/balance with family member assistance (All LTGs Due: 11/13/20)    Baseline daughter/paitent reports independence with HEP, completing daily. will continue to benefit from progressive HEP    Time 4    Period Weeks    Status Revised      PT LONG TERM GOAL #2   Title Patient will be able to complete 5x sit <>  stand in </= 20 seconds with CGA to demo improved balance and functional mobility    Baseline unable to complete; 22.53 seconds on 6/22    Time 4    Period Weeks    Status Revised      PT LONG TERM GOAL #3   Title Patient will demo ability to complete transfers from w/c <> mat with RW and supervision    Baseline CGA - Min A; CGA required with RW    Time 4    Period Weeks    Status Revised      PT LONG TERM GOAL #4   Title Patient will be able to stand >/= 2 minutes without UE support to improve tolerance for ADLs    Baseline < 30 seconds; 1 minute, 38 seconds on 7/7    Time 4    Period Weeks    Status On-going      PT LONG TERM GOAL #5   Title Patient will be able to ambulate for >/= 100 ft with LBQC vs RW for improved household mobility    Baseline 20 ft; 70 ft    Time 4    Period Weeks    Status On-going                   Plan - 11/06/20 1017     Clinical Impression Statement Patient limited today due to fatigue, requiring more freq rest breaks. However continued gait training work on negoitation around small turns throughout therapy gym with RW. Also conitnued standing balance activites and working toward improved weight shift onto RLE with toe tap activity. Intermittent CGA - Min A required. Will continue to progress toward all LTGs.    Personal Factors and Comorbidities Age;Time since onset of injury/illness/exacerbation;Comorbidity 3+    Comorbidities HTN, HLD, GERD, DJD, OA, RA, Hearing Loss, CAD, adenocarcinoma of right lung, Anxiety, A fib,, SDH  09/2016, chronic thrombocytopenia, left thalamic hemorrhage 07/2018, lumbar fracture December 2020    Examination-Activity Limitations Bed Mobility;Locomotion Level;Sit;Stand;Transfers    Examination-Participation Restrictions Medication Management;Community Activity;Interpersonal Relationship    Stability/Clinical Decision Making Evolving/Moderate complexity    Rehab Potential Fair    PT Frequency 2x / week    PT Duration 4 weeks    PT Treatment/Interventions ADLs/Self Care Home Management;Cryotherapy;Moist Heat;DME Instruction;Gait training;Stair training;Functional mobility training;Therapeutic activities;Therapeutic exercise;Balance training;Neuromuscular re-education;Patient/family education;Orthotic Fit/Training;Manual techniques;Wheelchair mobility training;Passive range of motion;Visual/perceptual remediation/compensation    PT Next Visit Plan Will need to check goals. Continue gait training w/ RW and hand grip attachment- does well with targets (need to problem solve to start to include turns so to increase his distance). Work on improving standing tolerance- did well with puzzles, bean bag toss, connect 4    Consulted and Agree with Plan of Care Patient;Family member/caregiver    Family Member Consulted Daughter/Son             Patient will benefit from skilled therapeutic intervention in order to improve the following deficits and impairments:  Abnormal gait, Decreased coordination, Decreased range of motion, Difficulty walking, Impaired tone, Pain, Impaired UE functional use, Decreased safety awareness, Decreased endurance, Decreased activity tolerance, Decreased knowledge of use of DME, Decreased balance, Decreased cognition, Decreased strength, Impaired sensation  Visit Diagnosis: Muscle weakness (generalized)  Other lack of coordination  Other abnormalities of gait and mobility  Difficulty in walking, not elsewhere classified     Problem List Patient Active Problem List    Diagnosis Date Noted   Rib pain on right side  03/19/2020   Sundowning 07/04/2019   Bilateral hearing loss 04/16/2019   Urinary frequency 03/21/2019   Chronic right shoulder pain 12/19/2018   Weakness generalized 09/28/2018   Palliative care encounter 09/28/2018   Pressure injury of skin 08/27/2018   COPD (chronic obstructive pulmonary disease) ([place]) 08/26/2018   AMS (altered mental status) 08/22/2018   Altered mental status 08/21/2018   UTI (urinary tract infection) 08/21/2018   Abnormality of gait 08/20/2018   Prolonged Q-T interval on ECG    Dysphagia, post-stroke    Supplemental oxygen dependent    Anxiety state    Fatigue    SOB (shortness of breath)    Hyperglycemia    History of lung cancer    Depression    CKD (chronic kidney disease), stage III ([place])    Acute blood loss anemia    Fall 07/16/2018   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   Acute spontaneous 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 ([place]) L thalamic 07/14/2018   Cellulitis of arm, right 06/01/2018   Rash and nonspecific skin eruption 06/01/2018   Chest pain 02/02/2018   Thrombocytopenia ([place]) 02/02/2018   Hypokalemia 02/02/2018   Pulmonary embolism ([place]) 02/02/2018   Elevated troponin 02/02/2018   Pulmonary embolus ([place]) 02/02/2018   Non-ST elevation MI (NSTEMI) ([place]) 52/77/8242   Diastolic CHF ([place]) 35/36/1443   Chest pain due to CAD ([place]) 06/26/2017   Dyspnea 06/21/2017   Acute bronchitis 05/15/2017   Oral candidiasis 05/15/2017   Acute pericarditis    Chest pain at rest 12/20/2016   Angina pectoris ([name]) 12/20/2016   Radiation pneumonitis ([place]) 11/09/2016   Pleurisy 10/11/2016   Chest wall pain 10/11/2016   Atrial fibrillation ([place]) 05/21/2016   Adenocarcinoma of right lung ([place]) 01/06/2016   GAD (generalized anxiety disorder) 11/20/2015   Essential hypertension    Cellulitis 09/04/2015   Cellulitis of left axilla     Esophageal ulcer without bleeding    Bilateral lower extremity edema 04/28/2014   BPH (benign prostatic hyperplasia) 10/08/2012   Anxiety    EKG abnormalities 09/05/2012   Tremor 09/05/2012   Chronic fatigue 09/05/2012   Arthritis    Asthma, chronic    Reflux esophagitis    S/P CABG (coronary artery bypass graft)    GERD (gastroesophageal reflux disease)    Hyperlipidemia    Hypertension    UNSPECIFIED PERIPHERAL VASCULAR DISEASE 06/16/2009    Jones Bales, PT, DPT 11/06/2020, 12:42 PM  [facility] [address] [place], [place], [address] Phone: [phone]   Fax:  [phone]  Name: Evan [name] MRN: [id number] Date of Birth: [birth year]

## 2020-11-09 ENCOUNTER — Ambulatory Visit: Payer: Medicare HMO

## 2020-11-09 ENCOUNTER — Other Ambulatory Visit: Payer: Self-pay

## 2020-11-09 ENCOUNTER — Ambulatory Visit: Payer: Medicare HMO | Admitting: Occupational Therapy

## 2020-11-09 DIAGNOSIS — R2681 Unsteadiness on feet: Secondary | ICD-10-CM

## 2020-11-09 DIAGNOSIS — R270 Ataxia, unspecified: Secondary | ICD-10-CM

## 2020-11-09 DIAGNOSIS — R2689 Other abnormalities of gait and mobility: Secondary | ICD-10-CM

## 2020-11-09 DIAGNOSIS — R278 Other lack of coordination: Secondary | ICD-10-CM

## 2020-11-09 DIAGNOSIS — M6281 Muscle weakness (generalized): Secondary | ICD-10-CM

## 2020-11-09 DIAGNOSIS — R29818 Other symptoms and signs involving the nervous system: Secondary | ICD-10-CM | POA: Diagnosis not present

## 2020-11-09 DIAGNOSIS — R29898 Other symptoms and signs involving the musculoskeletal system: Secondary | ICD-10-CM

## 2020-11-09 DIAGNOSIS — R262 Difficulty in walking, not elsewhere classified: Secondary | ICD-10-CM | POA: Diagnosis not present

## 2020-11-09 NOTE — Therapy (Addendum)
[facility] [address] [place], [place], [address] Phone: [phone]   Fax:  [phone]  Occupational Therapy Treatment  Patient Details  Name: Evan [name] MRN: [id number] Date of Birth: [birth year] Referring Provider (OT): Dr. Jamse Arn   Encounter Date: 11/09/2020   OT End of Session - 11/09/20 0936     Visit Number 17    Number of Visits 19    Date for OT Re-Evaluation 11/24/20    Authorization Type [employer]    Authorization Time Period requested 6 visits from 10/26/20 for [employer] update    Authorization - Visit Number 27    Authorization - Number of Visits 20    Progress Note Due on Visit 20    OT Start Time 0935    OT Stop Time 1015    OT Time Calculation (min) 40 min    Activity Tolerance Patient tolerated treatment well    Behavior During Therapy [facility] for tasks assessed/performed             Past Medical History:  Diagnosis Date   Adenocarcinoma of right lung ([place]) 01/06/2016   Anginal chest pain at rest [facility])    Chronic non, controlled on Lorazepam   Anxiety    Arthritis    "all over"    BPH (benign prostatic hyperplasia)    CAD (coronary artery disease)    Chronic lower back pain    Complication of anesthesia    "problems making water afterwards"   Depression    DJD (degenerative joint disease)    Esophageal ulcer without bleeding    bid ppi indefinitely   Family history of adverse reaction to anesthesia    "children w/PONV"   GERD (gastroesophageal reflux disease)    Headache    "couple/week maybe" (09/04/2015)   Hemochromatosis    Possible, elevated iron stores, hook-like osteophytes on hand films, normal LFTs   Hepatitis    "yellow jaundice as a baby"   History of ASCVD    MULTIVESSEL   Hyperlipidemia    Hypertension    Osteoarthritis    Pneumonia    "several times; got a little now" (09/04/2015)   Rheumatoid arthritis ([place])    Subdural hematoma ([place])    small after  fall 09/2016-plavix held, neurosurgery consulted.  Asymptomatic.     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[place]) 02/2018   on Lovenox lifelong since he failed PO Eliquis    Past Surgical History:  Procedure Laterality Date   ARM SKIN LESION BIOPSY / EXCISION Left 09/02/2015   CATARACT EXTRACTION W/ INTRAOCULAR LENS  IMPLANT, BILATERAL Bilateral    CHOLECYSTECTOMY OPEN     CORNEAL TRANSPLANT Bilateral    "one at [facility]; one at [facility]"   [place] W/ STENT     DE stent ostium into the right radial free graft at [facility]-, 02-2007   ESOPHAGOGASTRODUODENOSCOPY N/A 11/09/2014   Procedure: ESOPHAGOGASTRODUODENOSCOPY (EGD);  Surgeon: [name], MD;  [facility]: [facility] ENDOSCOPY;  Service: Endoscopy;  Laterality: N/A;   INGUINAL HERNIA REPAIR     JOINT REPLACEMENT     LEFT HEART CATH AND CORS/GRAFTS ANGIOGRAPHY N/A 06/27/2017   Procedure: LEFT HEART CATH AND CORS/GRAFTS ANGIOGRAPHY;  Surgeon: [name], MD;  Location: [facility] CV LAB;  Service: Cardiovascular;  Laterality: N/A;   NASAL SINUS SURGERY     SHOULDER OPEN ROTATOR CUFF REPAIR Right  TOTAL KNEE ARTHROPLASTY Bilateral     There were no vitals filed for this visit.   Subjective Assessment - 11/09/20 0935     Subjective  Denies pain    Pertinent History hx of Thalamic hemorrhage. PMH: HOH, CAD, RA, Hx of R RTC repair, hx of bilateral knee arthroplasty, hx of CABG, hx of aortic andurysm repair, hx of corneal transplant, hx of chronic low back pain, depression, R lung adenocarcinoma, posssible radiation (not active) pneumonitis, A fib, hx of DVT/PE--on treatment dose Lovenox, SDH 09/2016, chronic thrombocytopenia    Patient Stated Goals incr RUE functional use    Currently in Pain? No/denies              Picking up 1-inch blocks in and reaching to put in container with approx 90% accuracy on first attempt.  Light wt. Bearing through elbow and hand  with body on arm movements with noted decr in RUE ataxia/tremors with min cueing./facilitation for normal movement patterns.    Focused on positioning of RUE in neutral shoulder position at rest with min cues/facilitation.  Noted pt with improved positioning/decr guarding/flexor pattern at rest with decr facilitation.  Donning/doffing hat with BUEs with min cues for RUE.  Simulated donning/doffing pants with theraband loop.  Pt needed min A/cues initially, but with repetition only CGA for balance for sit>stand and stand>sit  Snapped 4 snaps on jacket for bilateral hand use with max difficulty initially, but improved with repetition.  Needed 2 rest breaks due to fatigue.  Opening/closing various bottles/containers for bilateral hand use with pt using LUE primarily as dominant, but mod difficulty with RUE use, improving with repetition.  Flipping large cards with min-mod difficulty/facilitation/cues for control and fully opening 5th digit.  Turning pages with sheet protectors in notebook with min facilitation/cues.        OT Short Term Goals - 10/30/20 1000       OT SHORT TERM GOAL #1   Title Pt/family will be independent with HEP for incr RUE functional use.--check STGs 09/24/20    Time 4    Period Weeks    Status Achieved      OT SHORT TERM GOAL #2   Title Pt will improve LUE coordination and low level functional reaching as shown by ability to grasp/release cylinder object in at least 3/5 trials.    Time 4    Period Weeks    Status Achieved      OT SHORT TERM GOAL #3   Title Pt will be able to pick up 1" object using pinch at least 2/5 trials for incr coordination for ADLs.    Time 4    Period Weeks    Status Achieved      OT SHORT TERM GOAL #4   Title --      OT SHORT TERM GOAL #5   Title --               OT Long Term Goals - 11/09/20 0939       OT LONG TERM GOAL #1   Title Pt will incorporate RUE functionally in at least 3 additional ADL tasks  consistently.    Baseline currently only using to push up/reach back for w/c and lock w/c brake with cues    Time 8    Period Weeks    Status Achieved      OT LONG TERM GOAL #2   Title Pt will improve RUE coordination as shown by improving score on box and blocks  test by at least 4.    Baseline 4 blocks.    Time 8    Period Weeks    Status Achieved   10/30/20- 9 BLOCKS     OT LONG TERM GOAL #3   Title Pt will improve LUE coordination and functional reaching to midlevel as shown by ability to grasp/release cylinder object in at least 3/5 trials.    Time 8    Period Weeks    Status On-going      OT LONG TERM GOAL #4   Title Pt will be able to pick up 1" object using pinch and place in bowl at least 4/5 trials for incr coordination for ADLs.    Time 8    Period Weeks    Status Achieved   11/09/20:  met                  Plan - 11/09/20 0936     OT Occupational Profile and History Pt continues to demo improving RUE control and use.  LTG #4 met   Occupational performance deficits (Please refer to evaluation for details): ADL's;IADL's    Body Structure / Function / Physical Skills ADL;Decreased knowledge of use of DME;Strength;UE functional use;ROM;FMC;GMC;Dexterity;Mobility;Coordination    Rehab Potential Fair    Modification or Assistance to Complete Evaluation  Min-Moderate modification of tasks or assist with assess necessary to complete eval    OT Frequency 2x / week    OT Duration 4 weeks    OT Treatment/Interventions Moist Heat;Fluidtherapy;Self-care/ADL training;DME and/or AE instruction;Splinting;Therapeutic activities;Therapeutic exercise;Neuromuscular education;Functional Mobility Training;Passive range of motion;Patient/family education;Manual Therapy    Plan check remaining goal, d/c next visit   OT Home Exercise Plan Pt has been doing shoulder flex with cane and theraband exercises (shoulder ext, scapular retraction, horizontal abduction) per dtr--instructed to hold  on theraband exercises due to decr control RUE    Consulted and Agree with Plan of Care Patient;Family member/caregiver    Family Member Consulted daughter             Patient will benefit from skilled therapeutic intervention in order to improve the following deficits and impairments:   Body Structure / Function / Physical Skills: ADL, Decreased knowledge of use of DME, Strength, UE functional use, ROM, FMC, GMC, Dexterity, Mobility, Coordination       Visit Diagnosis: Muscle weakness (generalized)  Other lack of coordination  Other symptoms and signs involving the musculoskeletal system  Other symptoms and signs involving the nervous system  Ataxia    Problem List Patient Active Problem List   Diagnosis Date Noted   Rib pain on right side 03/19/2020   Sundowning 07/04/2019   Bilateral hearing loss 04/16/2019   Urinary frequency 03/21/2019   Chronic right shoulder pain 12/19/2018   Weakness generalized 09/28/2018   Palliative care encounter 09/28/2018   Pressure injury of skin 08/27/2018   COPD (chronic obstructive pulmonary disease) ([place]) 08/26/2018   AMS (altered mental status) 08/22/2018   Altered mental status 08/21/2018   UTI (urinary tract infection) 08/21/2018   Abnormality of gait 08/20/2018   Prolonged Q-T interval on ECG    Dysphagia, post-stroke    Supplemental oxygen dependent    Anxiety state    Fatigue    SOB (shortness of breath)    Hyperglycemia    History of lung cancer    Depression    CKD (chronic kidney disease), stage III (HCC)    Acute blood loss anemia    Fall 07/16/2018  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   Acute spontaneous 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 ([place]) L thalamic 07/14/2018   Cellulitis of arm, right 06/01/2018   Rash and nonspecific skin eruption 06/01/2018   Chest pain 02/02/2018   Thrombocytopenia ([place]) 02/02/2018   Hypokalemia  02/02/2018   Pulmonary embolism ([place]) 02/02/2018   Elevated troponin 02/02/2018   Pulmonary embolus ([place]) 02/02/2018   Non-ST elevation MI (NSTEMI) ([place]) 96/22/2979   Diastolic CHF ([place]) 89/21/1941   Chest pain due to CAD ([place]) 06/26/2017   Dyspnea 06/21/2017   Acute bronchitis 05/15/2017   Oral candidiasis 05/15/2017   Acute pericarditis    Chest pain at rest 12/20/2016   Angina pectoris ([place]) 12/20/2016   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   Pleurisy 10/11/2016   Chest wall pain 10/11/2016   Atrial fibrillation ([place]) 05/21/2016   Adenocarcinoma of right lung ([place]) 01/06/2016   GAD (generalized anxiety disorder) 11/20/2015   Essential hypertension    Cellulitis 09/04/2015   Cellulitis of left axilla    Esophageal ulcer without bleeding    Bilateral lower extremity edema 04/28/2014   BPH (benign prostatic hyperplasia) 10/08/2012   Anxiety    EKG abnormalities 09/05/2012   Tremor 09/05/2012   Chronic fatigue 09/05/2012   Arthritis    Asthma, chronic    Reflux esophagitis    S/P CABG (coronary artery bypass graft)    GERD (gastroesophageal reflux disease)    Hyperlipidemia    Hypertension    UNSPECIFIED PERIPHERAL VASCULAR DISEASE 06/16/2009    [facility] 11/09/2020, 9:42 AM  [facility] [address] [place], [place], [address] Phone: [phone]   Fax:  [phone]  Name: Evan [name] MRN: [id number] Date of Birth: [birth year]  Vianne Bulls, OTR/L [facility] [address] [place], [place]  [address] [phone] phone [phone] 11/09/20 3:15 PM

## 2020-11-09 NOTE — Therapy (Signed)
[facility] [address] [place], [place], [address] Phone: [phone]   Fax:  [phone]  Physical Therapy Treatment/Discharge Summary  Patient Details  Name: Evan [name] MRN: [id number] Date of Birth: 1928/03/13 Referring Provider (PT): Delice Lesch, MD  PHYSICAL THERAPY DISCHARGE SUMMARY  Visits from Start of Care: 14  Current functional level related to goals / functional outcomes: See Clinical Impression Statement   Remaining deficits: Decreased Strength, Decreased Activity Tolerance, Gait Abnormality    Education / Equipment: HEP Provided. Educated on walking program and standing tolerance.   Patient agrees to discharge. Patient goals were partially met. Patient is being discharged due to being pleased with the current functional level.   Encounter Date: 11/09/2020   PT End of Session - 11/09/20 0847     Visit Number 14    Number of Visits 18    Date for PT Re-Evaluation 11/13/20    Authorization Type [employer] (10th Visit PN)    Authorization Time Period Awaiting Authorization Approval    PT Start Time [phone]    PT Stop Time 0930    PT Time Calculation (min) 43 min    Equipment Utilized During Treatment Gait belt    Activity Tolerance Patient tolerated treatment well;Patient limited by fatigue    Behavior During Therapy WFL for tasks assessed/performed             Past Medical History:  Diagnosis Date   Adenocarcinoma of right lung ([place]) 01/06/2016   Anginal chest pain at rest [facility])    Chronic non, controlled on Lorazepam   Anxiety    Arthritis    "all over"    BPH (benign prostatic hyperplasia)    CAD (coronary artery disease)    Chronic lower back pain    Complication of anesthesia    "problems making water afterwards"   Depression    DJD (degenerative joint disease)    Esophageal ulcer without bleeding    bid ppi indefinitely   Family history of adverse reaction to anesthesia     "children w/PONV"   GERD (gastroesophageal reflux disease)    Headache    "couple/week maybe" (09/04/2015)   Hemochromatosis    Possible, elevated iron stores, hook-like osteophytes on hand films, normal LFTs   Hepatitis    "yellow jaundice as a baby"   History of ASCVD    MULTIVESSEL   Hyperlipidemia    Hypertension    Osteoarthritis    Pneumonia    "several times; got a little now" (09/04/2015)   Rheumatoid arthritis ([place])    Subdural hematoma ([place])    small after fall 09/2016-plavix held, neurosurgery consulted.  Asymptomatic.     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[place]) 02/2018   on Lovenox lifelong since he failed PO Eliquis    Past Surgical History:  Procedure Laterality Date   ARM SKIN LESION BIOPSY / EXCISION Left 09/02/2015   CATARACT EXTRACTION W/ INTRAOCULAR LENS  IMPLANT, BILATERAL Bilateral    CHOLECYSTECTOMY OPEN     CORNEAL TRANSPLANT Bilateral    "one at [facility]; one at [facility]"   [place] W/ STENT     DE stent ostium into the right radial free graft at OM-, 02-2007   ESOPHAGOGASTRODUODENOSCOPY N/A 11/09/2014   Procedure: ESOPHAGOGASTRODUODENOSCOPY (EGD);  Surgeon: [name], MD;  Location: [facility] ENDOSCOPY;  Service: Endoscopy;  Laterality: N/A;   INGUINAL HERNIA REPAIR  JOINT REPLACEMENT     LEFT HEART CATH AND CORS/GRAFTS ANGIOGRAPHY N/A 06/27/2017   Procedure: LEFT HEART CATH AND CORS/GRAFTS ANGIOGRAPHY;  Surgeon: [name], MD;  Location: [facility] CV LAB;  Service: Cardiovascular;  Laterality: N/A;   NASAL SINUS SURGERY     SHOULDER OPEN ROTATOR CUFF REPAIR Right    TOTAL KNEE ARTHROPLASTY Bilateral     There were no vitals filed for this visit.   Subjective Assessment - 11/09/20 0849     Subjective No new changes. No falls or pain to report currently.    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 No/denies    Pain Onset In the past 7 days                [facility] PT Assessment - 11/09/20 0001       Assessment   Medical Diagnosis Thalamic hemorrhage    Referring Provider (PT) Delice Lesch, MD                           [facility] Adult PT Treatment/Exercise - 11/09/20 0001       Transfers   Transfers Sit to Stand;Stand to Sit;Stand Pivot Transfers    Sit to Stand 4: Min guard;With upper extremity assist;From bed;From chair/3-in-1    Sit to Stand Details Verbal cues for technique;Tactile cues for sequencing;Tactile cues for weight shifting    Five time sit to stand comments  41.02 seconds from mat with BUE support, PT providing cues for proper completion, inlcuding "Stand" "Sit". increased fatiigue after completion.    Stand to Sit 4: Min guard;With upper extremity assist;To bed;To chair/3-in-1    Stand Pivot Transfers 4: Min guard    Stand Pivot Transfer Details (indicate cue type and reason) completed transfer from w/c <> mat with close supervision/CGA required, as well as assistance with plcaing RUE into hand orthotic.      Ambulation/Gait   Ambulation/Gait Yes    Ambulation/Gait Assistance 4: Min guard    Ambulation/Gait Assistance Details completed ambulation with RW and R Hand Orthotic around therapy gym with 3 turns completed, patient able to ambulate 91 ft prior to needing rest break with CGA. no Assistance needed for RLE, only with steering RW around curve.    Ambulation Distance (Feet) 91 Feet    Assistive device Rolling walker;Other (Comment)    Gait Pattern Step-to pattern;Step-through pattern;Decreased step length - right;Decreased step  length - left;Narrow base of support    Ambulation Surface Level;Indoor      Neuro Re-ed    Neuro Re-ed Details  Completed standing without UE support and using Jenga blocks to build tower for improved standing tolerance with addition of reaching actviity, completed 1 min 15 secs on first trial; then second trial completed for 59 seconds on second trial. CGA required. Completed review of HEP. PT educating on importance of compiance of HEP and walking/standing activity daily within the home for compliance and maintain gains achieved with PT services. Patiet and Daughter verbalized understanding.             Access Code: [email or web address] URL: [email or web address]  Date: 09/23/2020 Prepared by: Baldomero Lamy   Exercises Supine Bridge - 1 x daily - 5 x weekly - 2 sets - 5 reps Seated Long Arc Quad with Ankle Weight - 2 x daily - 5 x weekly - 1 sets - 10 reps Seated March - 2 x daily - 5 x weekly - 1 sets - 10 reps Sit to Stand - 2 x daily - 5 x weekly - 1 sets - 5 reps Seated Hamstring Curl with Anchored Resistance - 2 x daily - 5 x weekly - 1 sets - 10 reps Bent Knee Fallouts with Alternating Legs - 1 x daily - 5 x weekly - 1 sets - 10 reps       PT Education - 11/09/20 1145     Education Details progress toward LTG; HEP    Person(s) Educated Patient;Child(ren)    Methods Explanation    Comprehension Verbalized understanding              PT Short Term Goals - 10/08/20 1342       PT SHORT TERM GOAL #1   Title = LTGs               PT Long Term Goals - 11/09/20 0855       PT LONG TERM GOAL #1   Title Patient will be independent with final HEP for strength/balance with family member assistance (All LTGs Due: 11/13/20)    Baseline daughter/paitent reports independence with HEP, completing daily. will continue to benefit from progressive HEP; reports independence with final HEP    Time 4    Period Weeks    Status Achieved      PT LONG TERM GOAL #2    Title Patient will be able to complete 5x sit <> stand in </= 20 seconds with CGA to demo improved balance and functional mobility    Baseline unable to complete; 22.53 seconds on 6/22; 41 seconds from mat    Time 4    Period Weeks    Status Not Met      PT LONG TERM GOAL #3   Title Patient will demo ability to complete transfers from w/c <> mat with RW and supervision    Baseline CGA - Min A; CGA required with RW; supervision/intermittetn CGA due to continue need for helping placing RUE in hand orthotic    Time 4    Period Weeks    Status Partially Met      PT LONG TERM GOAL #4   Title Patient will be able to stand >/= 2 minutes without UE support to improve tolerance for ADLs    Baseline < 30 seconds; 1 minute, 38 seconds on 7/7; 1  min 15 secs    Time 4    Period Weeks    Status Not Met      PT LONG TERM GOAL #5   Title Patient will be able to ambulate for >/= 100 ft with LBQC vs RW for improved household mobility    Baseline 20 ft; 70 ft; 91 ft with RW    Time 4    Period Weeks    Status Not Met                   Plan - 11/09/20 1146     Clinical Impression Statement Assessed patient's progress toward LTG. Patient able to meet/partially meet LTG #1 and 3 today during session. Patient progress toward other goals has fluctuated, due to most recent  set back due to pulled muscle in low back. paitent has made slight progress toward all other goals, but not to goal level.  Patient has improved gait distance to 91 ft with RW, but still continue to require CGA due to hand placement in RW. Reviewed HEP. PT educating on compliance with daily walking program and standing tolerance activities to maintain gains achieved with PT services. Patient and daughter agreeable to d/c at this time.    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, lumbar fracture December 2020    Examination-Activity Limitations Bed Mobility;Locomotion Level;Sit;Stand;Transfers    Examination-Participation Restrictions Medication Management;Community Activity;Interpersonal Relationship    Stability/Clinical Decision Making Evolving/Moderate complexity    Rehab Potential Fair    PT Frequency 2x / week    PT Duration 4 weeks    PT Treatment/Interventions ADLs/Self Care Home Management;Cryotherapy;Moist Heat;DME Instruction;Gait training;Stair training;Functional mobility training;Therapeutic activities;Therapeutic exercise;Balance training;Neuromuscular re-education;Patient/family education;Orthotic Fit/Training;Manual techniques;Wheelchair mobility training;Passive range of motion;Visual/perceptual remediation/compensation    Consulted and Agree with Plan of Care Patient;Family member/caregiver    Family Member Consulted Daughter/Son             Patient will benefit from skilled therapeutic intervention in order to improve the following deficits and impairments:  Abnormal gait, Decreased coordination, Decreased range of motion, Difficulty walking, Impaired tone, Pain, Impaired UE functional use, Decreased safety awareness, Decreased endurance, Decreased activity tolerance, Decreased knowledge of use of DME, Decreased balance, Decreased cognition, Decreased strength, Impaired sensation  Visit Diagnosis: Muscle weakness (generalized)  Other abnormalities of gait and mobility  Unsteadiness on feet  Difficulty in walking, not elsewhere classified     Problem List Patient Active Problem List   Diagnosis Date Noted   Rib pain on right side 03/19/2020   Sundowning 07/04/2019   Bilateral hearing loss 04/16/2019   Urinary frequency 03/21/2019   Chronic right shoulder pain 12/19/2018   Weakness generalized 09/28/2018   Palliative care encounter 09/28/2018    Pressure injury of skin 08/27/2018   COPD (chronic obstructive pulmonary disease) ([place]) 08/26/2018   AMS (altered mental status) 08/22/2018   Altered mental status 08/21/2018   UTI (urinary tract infection) 08/21/2018   Abnormality of gait 08/20/2018   Prolonged Q-T interval on ECG    Dysphagia, post-stroke    Supplemental oxygen dependent    Anxiety state    Fatigue    SOB (shortness of breath)    Hyperglycemia    History of lung cancer    Depression    CKD (chronic kidney disease), stage III ([place])    Acute blood loss anemia    Fall 07/16/2018   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   Acute spontaneous 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 ([place]) L thalamic 07/14/2018   Cellulitis of arm, right 06/01/2018   Rash and nonspecific skin eruption 06/01/2018   Chest pain 02/02/2018   Thrombocytopenia ([place]) 02/02/2018   Hypokalemia 02/02/2018   Pulmonary embolism ([place]) 02/02/2018   Elevated troponin 02/02/2018   Pulmonary embolus ([place]) 02/02/2018   Non-ST elevation MI (NSTEMI) ([place]) 20/94/7096   Diastolic CHF ([place]) 28/36/6294   Chest pain due to CAD ([place]) 06/26/2017   Dyspnea 06/21/2017   Acute bronchitis 05/15/2017   Oral candidiasis 05/15/2017   Acute pericarditis    Chest pain at rest 12/20/2016  Angina pectoris ([place]) 12/20/2016   Radiation pneumonitis ([place]) 11/09/2016   Pleurisy 10/11/2016   Chest wall pain 10/11/2016   Atrial fibrillation ([place]) 05/21/2016   Adenocarcinoma of right lung ([place]) 01/06/2016   GAD (generalized anxiety disorder) 11/20/2015   Essential hypertension    Cellulitis 09/04/2015   Cellulitis of left axilla    Esophageal ulcer without bleeding    Bilateral lower extremity edema 04/28/2014   BPH (benign prostatic hyperplasia) 10/08/2012   Anxiety    EKG abnormalities 09/05/2012   Tremor 09/05/2012   Chronic fatigue 09/05/2012   Arthritis    Asthma, chronic    Reflux  esophagitis    S/P CABG (coronary artery bypass graft)    GERD (gastroesophageal reflux disease)    Hyperlipidemia    Hypertension    UNSPECIFIED PERIPHERAL VASCULAR DISEASE 06/16/2009    Jones Bales, PT, DPT 11/09/2020, 11:52 AM  [facility] [address] [place], [place], [address] Phone: [phone]   Fax:  [phone]  Name: Evan [name] MRN: [id number] Date of Birth: 12-03-1927

## 2020-11-10 ENCOUNTER — Ambulatory Visit (INDEPENDENT_AMBULATORY_CARE_PROVIDER_SITE_OTHER): Payer: Medicare HMO | Admitting: Family Medicine

## 2020-11-10 ENCOUNTER — Encounter: Payer: Self-pay | Admitting: Family Medicine

## 2020-11-10 ENCOUNTER — Other Ambulatory Visit (HOSPITAL_COMMUNITY): Payer: Self-pay

## 2020-11-10 VITALS — BP 118/66 | HR 66 | Temp 98.2°F | Resp 14 | Ht 60.0 in | Wt 162.0 lb

## 2020-11-10 DIAGNOSIS — S90424A Blister (nonthermal), right lesser toe(s), initial encounter: Secondary | ICD-10-CM | POA: Diagnosis not present

## 2020-11-10 DIAGNOSIS — H6121 Impacted cerumen, right ear: Secondary | ICD-10-CM | POA: Diagnosis not present

## 2020-11-10 DIAGNOSIS — R8290 Unspecified abnormal findings in urine: Secondary | ICD-10-CM

## 2020-11-10 DIAGNOSIS — M069 Rheumatoid arthritis, unspecified: Secondary | ICD-10-CM | POA: Diagnosis not present

## 2020-11-10 MED ORDER — SILODOSIN 8 MG PO CAPS: 8.0000 mg | ORAL_CAPSULE | Freq: Every day | ORAL | 2 refills | Status: DC

## 2020-11-10 MED ORDER — TACROLIMUS 0.1 % EX OINT
1.0000 "application " | TOPICAL_OINTMENT | Freq: Two times a day (BID) | CUTANEOUS | 3 refills | Status: DC
  Filled 2020-11-10: qty 100, 50d supply, fill #0
  Filled 2021-10-01: qty 30, 15d supply, fill #0

## 2020-11-10 NOTE — Progress Notes (Signed)
Subjective:    Patient ID: Evan [name], male    DOB: 03-29-1928, 87 y.o.   MRN: [id number]  HPI   Patient is here today with his daughter.  First he has a large blood blister on the plantar aspect of his first MTP joint.  It is roughly 3 cm in diameter.  It is tense and purple.  He is on Xarelto.  He previously had a large hematoma like this on his arm that subsequently turned into an open sore that would not heal.  His daughter would like me to aspirate the blood blister today and then put a pressure dressing on it to try to prevent it from recurring.  She also reports several days of malodorous urine however he denies any dysuria urgency or frequency or hematuria.  Third he has decreased hearing and examination reveals a cerumen impaction in his right ear. Past Medical History:  Diagnosis Date   Adenocarcinoma of right lung ([place]) 01/06/2016   Anginal chest pain at rest [facility])    Chronic non, controlled on Lorazepam   Anxiety    Arthritis    "all over"    BPH (benign prostatic hyperplasia)    CAD (coronary artery disease)    Chronic lower back pain    Complication of anesthesia    "problems making water afterwards"   Depression    DJD (degenerative joint disease)    Esophageal ulcer without bleeding    bid ppi indefinitely   Family history of adverse reaction to anesthesia    "children w/PONV"   GERD (gastroesophageal reflux disease)    Headache    "couple/week maybe" (09/04/2015)   Hemochromatosis    Possible, elevated iron stores, hook-like osteophytes on hand films, normal LFTs   Hepatitis    "yellow jaundice as a baby"   History of ASCVD    MULTIVESSEL   Hyperlipidemia    Hypertension    Osteoarthritis    Pneumonia    "several times; got a little now" (09/04/2015)   Rheumatoid arthritis ([place])    Subdural hematoma ([place])    small after fall 09/2016-plavix held, neurosurgery consulted.  Asymptomatic.     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[place]) 02/2018   on Lovenox lifelong since he failed  PO Eliquis   Past Surgical History:  Procedure Laterality Date   ARM SKIN LESION BIOPSY / EXCISION Left 09/02/2015   CATARACT EXTRACTION W/ INTRAOCULAR LENS  IMPLANT, BILATERAL Bilateral    CHOLECYSTECTOMY OPEN     CORNEAL TRANSPLANT Bilateral    "one at [facility]; one at [facility]"   [place] W/ STENT     DE stent ostium into the right radial free graft at OM-, 02-2007   ESOPHAGOGASTRODUODENOSCOPY N/A 11/09/2014   Procedure: ESOPHAGOGASTRODUODENOSCOPY (EGD);  Surgeon: [name], MD;  Location: [facility] ENDOSCOPY;  Service: Endoscopy;  Laterality: N/A;   INGUINAL HERNIA REPAIR     JOINT REPLACEMENT     LEFT HEART CATH AND CORS/GRAFTS ANGIOGRAPHY N/A 06/27/2017   Procedure: LEFT HEART CATH AND CORS/GRAFTS ANGIOGRAPHY;  Surgeon: [name], MD;  Location: [place] CV LAB;  Service: Cardiovascular;  Laterality: N/A;   NASAL SINUS SURGERY     SHOULDER OPEN ROTATOR CUFF REPAIR Right    TOTAL KNEE ARTHROPLASTY Bilateral    Current Outpatient Medications on File Prior to Visit  Medication Sig Dispense Refill   acetaminophen (TYLENOL) 325 MG tablet Take  1-2 tablets (325-650 mg total) by mouth every 4 (four) hours as needed for mild pain.     ALPRAZolam (XANAX) 0.5 MG tablet Take 0.5 tablets (0.25 mg total) by mouth 3 (three) times daily as needed for anxiety. 30 tablet 0   ciprofloxacin (CIPRO) 250 MG tablet Take 1 tablet by mouth twice a day 14 tablet 0   FLUoxetine (PROZAC) 20 MG capsule Take 1 capsule (20 mg total) by mouth daily. 90 capsule 3   furosemide (LASIX) 20 MG tablet TAKE 1 TABLET DAILY. MAY TAKE AN EXTRA 20 MG TABLET IF INCREASED SHORTNESS OF BREATH OR WEIGHT GAIN. 135 tablet 2   hydrOXYzine (ATARAX/VISTARIL) 10 MG tablet Take 1 tablet (10 mg total) by mouth at bedtime. 30 tablet 0   Lifitegrast (XIIDRA) 5 % SOLN Place 1-2 drops into both eyes daily.      magnesium oxide (MAG-OX) 400  (241.3 Mg) MG tablet TAKE 1 TABLET (400 MG TOTAL) BY MOUTH 2 (TWO) TIMES DAILY 180 tablet 2   metoprolol tartrate (LOPRESSOR) 25 MG tablet TAKE ONE-HALF TABLET (12.5 MG TOTAL) BY MOUTH 2 (TWO) TIMES DAILY. 90 tablet 3   nitroGLYCERIN (NITROSTAT) 0.4 MG SL tablet Place 1 tablet (0.4 mg total) under the tongue every 5 (five) minutes as needed for chest pain. 25 tablet 2   pantoprazole (PROTONIX) 40 MG tablet TAKE ONE (1) TABLET BY MOUTH EVERY DAY 90 tablet 3   polyethylene glycol (MIRALAX / GLYCOLAX) 17 g packet Take 17 g by mouth daily as needed for mild constipation. 14 each 0   Rivaroxaban (XARELTO) 15 MG TABS tablet TAKE 1 TABLET (15 MG TOTAL) BY MOUTH DAILY WITH SUPPER. 90 tablet 1   rosuvastatin (CRESTOR) 5 MG tablet TAKE 1 TABLET EVERY DAY 90 tablet 1   sulfamethoxazole-trimethoprim (BACTRIM DS) 800-160 MG tablet Take 1 tablet by mouth twice daily 14 tablet 0   No current facility-administered medications on file prior to visit.   Allergies  Allergen Reactions   Feldene [Piroxicam] Other (See Comments)    REACTION: Blisters   Neomycin-Polymyxin-Hc Itching   Statins Other (See Comments)    Myalgias   Doxycycline Other (See Comments)    REACTION:  unknown   Latex Rash   Tequin Anxiety   Valium Other (See Comments)    REACTION:  unknown   Social History   Socioeconomic History   Marital status: Widowed    Spouse name: Not on file   Number of children: Not on file   Years of education: Not on file   Highest education level: Not on file  Occupational History   Not on file  Tobacco Use   Smoking status: Passive Smoke Exposure - Never Smoker   Smokeless tobacco: Former    Types: Chew   Tobacco comments:    "quit chewing in the 1960s"  Vaping Use   Vaping Use: Never used  Substance and Sexual Activity   Alcohol use: No   Drug use: No   Sexual activity: Never  Other Topics Concern   Not on file  Social History Narrative   01-05-18 Unable to ask abuse questions family  with him today.   11-012-19 Unable to ask abuse questions family with him today.   Social Determinants of Health   Financial Resource Strain: Not on file  Food Insecurity: Not on file  Transportation Needs: Not on file  Physical Activity: Not on file  Stress: Not on file  Social Connections: Not on file  Intimate Partner Violence: Not  on file      Review of Systems     Objective:   Physical Exam Vitals reviewed.  Constitutional:      Appearance: Normal appearance.  HENT:     Right Ear: There is impacted cerumen.  Cardiovascular:     Rate and Rhythm: Normal rate and regular rhythm.  Pulmonary:     Effort: Pulmonary effort is normal.     Breath sounds: Normal breath sounds.  Musculoskeletal:     Right foot: Deformity present.       Feet:  Neurological:     Mental Status: He is alert.  Right-sided hemiparesis with weakness in his right leg.  Weakness with knee flexion and extension and ankle flexion and extension.  Ataxia in the right upper arm and right lower leg.         Assessment & Plan:  Foul smelling urine - Plan: Urinalysis, Routine w reflex microscopic  Patient has right cerumen impactions that were removed with irrigation and lavage without complication.  Check urinalysis to rule out urinary tract infection.  I aspirated the large blood blister on the plantar aspect of his right first MTP joint with an 18-gauge needle and deflated the entire blister.  I then covered the blister with Neosporin, nonadherent gauze, and wrapped the foot with Coban to create a pressure dressing.  Recommended they leave the pressure dressing in place until Friday.

## 2020-11-11 ENCOUNTER — Other Ambulatory Visit: Payer: Medicare HMO

## 2020-11-11 ENCOUNTER — Other Ambulatory Visit: Payer: Self-pay

## 2020-11-11 DIAGNOSIS — R8290 Unspecified abnormal findings in urine: Secondary | ICD-10-CM | POA: Diagnosis not present

## 2020-11-12 ENCOUNTER — Encounter: Payer: Self-pay | Admitting: Occupational Therapy

## 2020-11-12 ENCOUNTER — Ambulatory Visit: Payer: Medicare HMO | Admitting: Occupational Therapy

## 2020-11-12 DIAGNOSIS — M6281 Muscle weakness (generalized): Secondary | ICD-10-CM | POA: Diagnosis not present

## 2020-11-12 DIAGNOSIS — R29898 Other symptoms and signs involving the musculoskeletal system: Secondary | ICD-10-CM | POA: Diagnosis not present

## 2020-11-12 DIAGNOSIS — R262 Difficulty in walking, not elsewhere classified: Secondary | ICD-10-CM | POA: Diagnosis not present

## 2020-11-12 DIAGNOSIS — R2681 Unsteadiness on feet: Secondary | ICD-10-CM

## 2020-11-12 DIAGNOSIS — R29818 Other symptoms and signs involving the nervous system: Secondary | ICD-10-CM

## 2020-11-12 DIAGNOSIS — R278 Other lack of coordination: Secondary | ICD-10-CM | POA: Diagnosis not present

## 2020-11-12 DIAGNOSIS — R270 Ataxia, unspecified: Secondary | ICD-10-CM

## 2020-11-12 DIAGNOSIS — R2689 Other abnormalities of gait and mobility: Secondary | ICD-10-CM | POA: Diagnosis not present

## 2020-11-12 LAB — URINALYSIS, ROUTINE W REFLEX MICROSCOPIC
Bilirubin Urine: NEGATIVE
Glucose, UA: NEGATIVE
Hgb urine dipstick: NEGATIVE
Ketones, ur: NEGATIVE
Leukocytes,Ua: NEGATIVE
Nitrite: NEGATIVE
Protein, ur: NEGATIVE
Specific Gravity, Urine: 1.011 (ref 1.001–1.035)
pH: 6.5 (ref 5.0–8.0)

## 2020-11-12 NOTE — Therapy (Signed)
[facility] [place] [address] [place], [place], [address] Phone: [phone]   Fax:  [phone]  Occupational Therapy Treatment  Patient Details  Name: Evan [name] MRN: [id number] Date of Birth: 1928/02/05 Referring Provider (OT): Dr. Jamse Arn   Encounter Date: 11/12/2020   OT End of Session - 11/12/20 1217     Visit Number 18    Number of Visits 19    Date for OT Re-Evaluation 11/24/20    Authorization Type [employer]    Authorization Time Period requested 6 visits from 10/26/20 for [employer] update    Authorization - Visit Number 18    Authorization - Number of Visits 20    Progress Note Due on Visit 20    OT Start Time 1020    OT Stop Time 1100    OT Time Calculation (min) 40 min    Activity Tolerance Patient tolerated treatment well    Behavior During Therapy [facility] for tasks assessed/performed             Past Medical History:  Diagnosis Date   Adenocarcinoma of right lung ([place]) 01/06/2016   Anginal chest pain at rest [facility])    Chronic non, controlled on Lorazepam   Anxiety    Arthritis    "all over"    BPH (benign prostatic hyperplasia)    CAD (coronary artery disease)    Chronic lower back pain    Complication of anesthesia    "problems making water afterwards"   Depression    DJD (degenerative joint disease)    Esophageal ulcer without bleeding    bid ppi indefinitely   Family history of adverse reaction to anesthesia    "children w/PONV"   GERD (gastroesophageal reflux disease)    Headache    "couple/week maybe" (09/04/2015)   Hemochromatosis    Possible, elevated iron stores, hook-like osteophytes on hand films, normal LFTs   Hepatitis    "yellow jaundice as a baby"   History of ASCVD    MULTIVESSEL   Hyperlipidemia    Hypertension    Osteoarthritis    Pneumonia    "several times; got a little now" (09/04/2015)   Rheumatoid arthritis ([place])    Subdural hematoma ([place])    small  after fall 09/2016-plavix held, neurosurgery consulted.  Asymptomatic.     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[place]) 02/2018   on Lovenox lifelong since he failed PO Eliquis    Past Surgical History:  Procedure Laterality Date   ARM SKIN LESION BIOPSY / EXCISION Left 09/02/2015   CATARACT EXTRACTION W/ INTRAOCULAR LENS  IMPLANT, BILATERAL Bilateral    CHOLECYSTECTOMY OPEN     CORNEAL TRANSPLANT Bilateral    "one at [facility]; one at [facility]"   [place] W/ STENT     DE stent ostium into the right radial free graft at [facility]-, 02-2007   ESOPHAGOGASTRODUODENOSCOPY N/A 11/09/2014   Procedure: ESOPHAGOGASTRODUODENOSCOPY (EGD);  Surgeon: [name], MD;  Location: [facility] ENDOSCOPY;  Service: Endoscopy;  Laterality: N/A;   INGUINAL HERNIA REPAIR     JOINT REPLACEMENT     LEFT HEART CATH AND CORS/GRAFTS ANGIOGRAPHY N/A 06/27/2017   Procedure: LEFT HEART CATH AND CORS/GRAFTS ANGIOGRAPHY;  Surgeon: [name], MD;  Location: [place] [facility] CV LAB;  Service: Cardiovascular;  Laterality: N/A;   NASAL SINUS SURGERY     SHOULDER OPEN ROTATOR CUFF REPAIR Right  TOTAL KNEE ARTHROPLASTY Bilateral     There were no vitals filed for this visit.   Subjective Assessment - 11/12/20 1045     Subjective  dtr reports that pt had a bad night last night.  Dtr pleased with pt progress.    Patient is accompanied by: Family member    Pertinent History hx of Thalamic hemorrhage. PMH: HOH, CAD, RA, Hx of R RTC repair, hx of bilateral knee arthroplasty, hx of CABG, hx of aortic andurysm repair, hx of corneal transplant, hx of chronic low back pain, depression, R lung adenocarcinoma, posssible radiation (not active) pneumonitis, A fib, hx of DVT/PE--on treatment dose Lovenox, SDH 09/2016, chronic thrombocytopenia    Patient Stated Goals incr RUE functional use    Currently in Pain? No/denies               Light wt. Bearing through elbow  and hand with body on arm movements with noted decr in RUE ataxia/tremors with min cueing./facilitation for normal movement patterns.    Focused on positioning of RUE in neutral shoulder position at rest with min cues/facilitation.  Noted pt with improved positioning/decr guarding/flexor pattern at rest with decr facilitation.  Unsnapping snaps on jacket for bilateral hand use with min difficulty, but improved with repetition.  Attempted to fasten snaps today with max difficulty/unable.  Checked remaining goal, discussed progress, and reviewed recommendations for home (HEP, ways to incr RUE functional use particularly with bilateral task, strategies for ADLs).  Issued buddy strap for 4-5th digits due to difficulty/decr awareness for fully extending 5th digit which interferes with grasp.  Pt/dtr instructed in use and practiced use during session for grasp/release with incr ease noted.    Flipping large cards with min-mod difficulty/facilitation/cues for control and (using buddy strap to assist with fully opening 5th digit).  Grasp/release and low range functional reaching of cones with min-mod cueing/facilitation for control/stabilization (laterally and across body), then with mod facilitation for mid range functional reach for incr control.    Picking up cup with BUEs and bring to mouth min difficulty/verbal and visual cues for timing/force to simulate drinking.  Slow, controlled movements to lightly touch object without moving with min-mod difficulty/cues.          OT Short Term Goals - 10/30/20 1000       OT SHORT TERM GOAL #1   Title Pt/family will be independent with HEP for incr RUE functional use.--check STGs 09/24/20    Time 4    Period Weeks    Status Achieved      OT SHORT TERM GOAL #2   Title Pt will improve LUE coordination and low level functional reaching as shown by ability to grasp/release cylinder object in at least 3/5 trials.    Time 4    Period Weeks    Status  Achieved      OT SHORT TERM GOAL #3   Title Pt will be able to pick up 1" object using pinch at least 2/5 trials for incr coordination for ADLs.    Time 4    Period Weeks    Status Achieved      OT SHORT TERM GOAL #4   Title --      OT SHORT TERM GOAL #5   Title --               OT Long Term Goals - 11/12/20 1044       OT LONG TERM GOAL #1   Title Pt will incorporate  RUE functionally in at least 3 additional ADL tasks consistently.    Baseline currently only using to push up/reach back for w/c and lock w/c brake with cues    Time 8    Period Weeks    Status Achieved      OT LONG TERM GOAL #2   Title Pt will improve RUE coordination as shown by improving score on box and blocks test by at least 4.    Baseline 4 blocks.    Time 8    Period Weeks    Status Achieved   10/30/20- 9 BLOCKS     OT LONG TERM GOAL #3   Title Pt will improve LUE coordination and functional reaching to midlevel as shown by ability to grasp/release cylinder object in at least 3/5 trials.    Time 8    Period Weeks    Status Not Met   11/12/20:  not consistent     OT LONG TERM GOAL #4   Title Pt will be able to pick up 1" object using pinch and place in bowl at least 4/5 trials for incr coordination for ADLs.    Time 8    Period Weeks    Status Achieved   11/09/20:  met                  Plan - 11/12/20 1218     Clinical Impression Statement Pt has made good progress with with incr RUE control and use.    OT Occupational Profile and History Detailed Assessment- Review of Records and additional review of physical, cognitive, psychosocial history related to current functional performance    Occupational performance deficits (Please refer to evaluation for details): ADL's;IADL's    Body Structure / Function / Physical Skills ADL;Decreased knowledge of use of DME;Strength;UE functional use;ROM;FMC;GMC;Dexterity;Mobility;Coordination    Rehab Potential Fair    Modification or Assistance to  Complete Evaluation  Min-Moderate modification of tasks or assist with assess necessary to complete eval    OT Frequency 2x / week    OT Duration 4 weeks    OT Treatment/Interventions Moist Heat;Fluidtherapy;Self-care/ADL training;DME and/or AE instruction;Splinting;Therapeutic activities;Therapeutic exercise;Neuromuscular education;Functional Mobility Training;Passive range of motion;Patient/family education;Manual Therapy    Plan d/c OT    OT Home Exercise Plan Pt has been doing shoulder flex with cane and theraband exercises (shoulder ext, scapular retraction, horizontal abduction) per dtr--instructed to hold on theraband exercises due to decr control RUE    Consulted and Agree with Plan of Care Patient;Family member/caregiver    Family Member Consulted daughter             Patient will benefit from skilled therapeutic intervention in order to improve the following deficits and impairments:   Body Structure / Function / Physical Skills: ADL, Decreased knowledge of use of DME, Strength, UE functional use, ROM, FMC, GMC, Dexterity, Mobility, Coordination       Visit Diagnosis: Muscle weakness (generalized)  Other symptoms and signs involving the musculoskeletal system  Other symptoms and signs involving the nervous system  Ataxia  Unsteadiness on feet    Problem List Patient Active Problem List   Diagnosis Date Noted   Rib pain on right side 03/19/2020   Sundowning 07/04/2019   Bilateral hearing loss 04/16/2019   Urinary frequency 03/21/2019   Chronic right shoulder pain 12/19/2018   Weakness generalized 09/28/2018   Palliative care encounter 09/28/2018   Pressure injury of skin 08/27/2018   COPD (chronic obstructive pulmonary disease) ([place]) 08/26/2018  AMS (altered mental status) 08/22/2018   Altered mental status 08/21/2018   UTI (urinary tract infection) 08/21/2018   Abnormality of gait 08/20/2018   Prolonged Q-T interval on ECG    Dysphagia, post-stroke     Supplemental oxygen dependent    Anxiety state    Fatigue    SOB (shortness of breath)    Hyperglycemia    History of lung cancer    Depression    CKD (chronic kidney disease), stage III (HCC)    Acute blood loss anemia    Fall 07/16/2018   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   Acute spontaneous 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 ([place]) L thalamic 07/14/2018   Cellulitis of arm, right 06/01/2018   Rash and nonspecific skin eruption 06/01/2018   Chest pain 02/02/2018   Thrombocytopenia ([place]) 02/02/2018   Hypokalemia 02/02/2018   Pulmonary embolism ([place]) 02/02/2018   Elevated troponin 02/02/2018   Pulmonary embolus ([place]) 02/02/2018   Non-ST elevation MI (NSTEMI) ([place]) 37/90/2409   Diastolic CHF ([place]) 73/53/2992   Chest pain due to CAD ([place]) 06/26/2017   Dyspnea 06/21/2017   Acute bronchitis 05/15/2017   Oral candidiasis 05/15/2017   Acute pericarditis    Chest pain at rest 12/20/2016   Angina pectoris ([place]) 12/20/2016   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   Pleurisy 10/11/2016   Chest wall pain 10/11/2016   Atrial fibrillation ([place]) 05/21/2016   Adenocarcinoma of right lung ([place]) 01/06/2016   GAD (generalized anxiety disorder) 11/20/2015   Essential hypertension    Cellulitis 09/04/2015   Cellulitis of left axilla    Esophageal ulcer without bleeding    Bilateral lower extremity edema 04/28/2014   BPH (benign prostatic hyperplasia) 10/08/2012   Anxiety    EKG abnormalities 09/05/2012   Tremor 09/05/2012   Chronic fatigue 09/05/2012   Arthritis    Asthma, chronic    Reflux esophagitis    S/P CABG (coronary artery bypass graft)    GERD (gastroesophageal reflux disease)    Hyperlipidemia    Hypertension    UNSPECIFIED PERIPHERAL VASCULAR DISEASE 06/16/2009   OCCUPATIONAL THERAPY DISCHARGE SUMMARY  Visits from Start of Care: 18  Current functional level related to goals / functional  outcomes: See above   Remaining deficits: Pt with R hemiparesis with ataxia, decr coordination, decr strength, spasticity, and decr RUE functional use as well as decr balance.  Pt demo improvement with all deficits.   Education / Equipment: Pt/family educated on HEP and ways to incr RUE functional use and ADL performance.   Patient agrees to discharge. Patient goals were partially met. Patient is being discharged due to maximized rehab potential. At this time.  Pt has met 3/3 STGs and 3/4 LTGs and demo/reports incr RUE functional use.        [facility] 11/12/2020, 12:19 PM  [place] [address] [place], [place], [address] Phone: [phone]   Fax:  [phone]  Name: Evan [name] MRN: [id number] Date of Birth: May 18, 1927  Vianne Bulls, OTR/L [facility] [address] [place], [place]  [address] [phone] phone [phone] 11/12/20 12:19 PM

## 2020-11-16 ENCOUNTER — Other Ambulatory Visit (HOSPITAL_COMMUNITY): Payer: Self-pay

## 2020-11-17 ENCOUNTER — Other Ambulatory Visit (HOSPITAL_COMMUNITY): Payer: Self-pay

## 2020-11-17 DIAGNOSIS — R3915 Urgency of urination: Secondary | ICD-10-CM | POA: Diagnosis not present

## 2020-11-18 ENCOUNTER — Other Ambulatory Visit (HOSPITAL_COMMUNITY): Payer: Self-pay

## 2020-11-18 ENCOUNTER — Other Ambulatory Visit: Payer: Self-pay | Admitting: Urology

## 2020-11-19 ENCOUNTER — Other Ambulatory Visit: Payer: Self-pay | Admitting: Urology

## 2020-11-19 ENCOUNTER — Other Ambulatory Visit (HOSPITAL_COMMUNITY): Payer: Self-pay

## 2020-11-20 ENCOUNTER — Other Ambulatory Visit (HOSPITAL_COMMUNITY): Payer: Self-pay

## 2020-11-20 ENCOUNTER — Other Ambulatory Visit: Payer: Self-pay | Admitting: Urology

## 2020-11-21 ENCOUNTER — Other Ambulatory Visit: Payer: Self-pay | Admitting: Urology

## 2020-11-21 ENCOUNTER — Other Ambulatory Visit (HOSPITAL_COMMUNITY): Payer: Self-pay

## 2020-11-23 ENCOUNTER — Other Ambulatory Visit (HOSPITAL_COMMUNITY): Payer: Self-pay

## 2020-11-23 ENCOUNTER — Other Ambulatory Visit: Payer: Self-pay | Admitting: Urology

## 2020-11-30 ENCOUNTER — Telehealth: Payer: Self-pay | Admitting: Family Medicine

## 2020-11-30 ENCOUNTER — Other Ambulatory Visit (HOSPITAL_COMMUNITY): Payer: Self-pay

## 2020-11-30 MED ORDER — RIVAROXABAN 15 MG PO TABS
15.0000 mg | ORAL_TABLET | Freq: Every day | ORAL | 1 refills | Status: DC
  Filled 2020-11-30: qty 90, 90d supply, fill #0
  Filled 2020-11-30: qty 30, 30d supply, fill #0
  Filled 2021-01-02: qty 30, 30d supply, fill #1
  Filled 2021-02-03: qty 30, 30d supply, fill #2
  Filled 2021-03-01: qty 30, 30d supply, fill #3
  Filled 2021-04-05: qty 30, 30d supply, fill #4

## 2020-11-30 MED ORDER — MAGNESIUM OXIDE -MG SUPPLEMENT 400 (240 MG) MG PO TABS
400.0000 mg | ORAL_TABLET | Freq: Every day | ORAL | 3 refills | Status: DC
  Filled 2020-11-30: qty 90, 90d supply, fill #0

## 2020-11-30 NOTE — Telephone Encounter (Signed)
Prescription sent to pharmacy.

## 2020-11-30 NOTE — Telephone Encounter (Signed)
Received call from patient's daughter [name] to request refill of Rivaroxaban (XARELTO) 15 MG TABS tablet [[id number]]  ; requesting Rx be sent to local pharmacy. Patient's daughter forgot she cancelled the mail delivery and patient only has one more dose left.  Pharmacy confirmed as [facility] on [address], phone number, [phone], fax number [phone].  Please advise at [phone]

## 2020-11-30 NOTE — Telephone Encounter (Signed)
Patient's daughter [name] called back to also request a refill of Rx #: [id number]  magnesium oxide (MAG-OX) 400 (241.3 Mg) MG tablet [[id number]]  Stated she called the mail order pharmacy to cancel the refills just a few minutes ago.  Requesting Rx be sent to [facility] on [address].  Please advise at [phone] with questions

## 2020-12-01 ENCOUNTER — Other Ambulatory Visit (HOSPITAL_COMMUNITY): Payer: Self-pay

## 2020-12-02 DIAGNOSIS — L8940 Pressure ulcer of contiguous site of back, buttock and hip, unspecified stage: Secondary | ICD-10-CM | POA: Diagnosis not present

## 2020-12-02 DIAGNOSIS — L89519 Pressure ulcer of right ankle, unspecified stage: Secondary | ICD-10-CM | POA: Diagnosis not present

## 2020-12-02 DIAGNOSIS — S0190XA Unspecified open wound of unspecified part of head, initial encounter: Secondary | ICD-10-CM | POA: Diagnosis not present

## 2020-12-18 DIAGNOSIS — R3915 Urgency of urination: Secondary | ICD-10-CM | POA: Diagnosis not present

## 2020-12-23 ENCOUNTER — Other Ambulatory Visit (HOSPITAL_COMMUNITY): Payer: Self-pay

## 2021-01-04 ENCOUNTER — Other Ambulatory Visit (HOSPITAL_COMMUNITY): Payer: Self-pay

## 2021-01-05 ENCOUNTER — Ambulatory Visit: Payer: Medicare HMO | Admitting: Physical Medicine & Rehabilitation

## 2021-01-08 ENCOUNTER — Other Ambulatory Visit (HOSPITAL_COMMUNITY): Payer: Self-pay

## 2021-01-08 DIAGNOSIS — R3914 Feeling of incomplete bladder emptying: Secondary | ICD-10-CM | POA: Diagnosis not present

## 2021-01-08 DIAGNOSIS — N3000 Acute cystitis without hematuria: Secondary | ICD-10-CM | POA: Diagnosis not present

## 2021-01-08 DIAGNOSIS — N139 Obstructive and reflux uropathy, unspecified: Secondary | ICD-10-CM | POA: Diagnosis not present

## 2021-01-08 DIAGNOSIS — R3915 Urgency of urination: Secondary | ICD-10-CM | POA: Diagnosis not present

## 2021-01-08 MED ORDER — AMOXICILLIN-POT CLAVULANATE 500-125 MG PO TABS
1.0000 | ORAL_TABLET | Freq: Two times a day (BID) | ORAL | 0 refills | Status: DC
  Filled 2021-01-08: qty 14, 7d supply, fill #0

## 2021-01-11 DIAGNOSIS — R3915 Urgency of urination: Secondary | ICD-10-CM | POA: Diagnosis not present

## 2021-01-11 DIAGNOSIS — N139 Obstructive and reflux uropathy, unspecified: Secondary | ICD-10-CM | POA: Diagnosis not present

## 2021-01-11 DIAGNOSIS — N3000 Acute cystitis without hematuria: Secondary | ICD-10-CM | POA: Diagnosis not present

## 2021-01-15 DIAGNOSIS — R3915 Urgency of urination: Secondary | ICD-10-CM | POA: Diagnosis not present

## 2021-01-21 ENCOUNTER — Other Ambulatory Visit (HOSPITAL_COMMUNITY): Payer: Self-pay

## 2021-01-21 ENCOUNTER — Ambulatory Visit (INDEPENDENT_AMBULATORY_CARE_PROVIDER_SITE_OTHER): Payer: Medicare HMO | Admitting: Nurse Practitioner

## 2021-01-21 ENCOUNTER — Other Ambulatory Visit: Payer: Self-pay

## 2021-01-21 ENCOUNTER — Ambulatory Visit
Admission: RE | Admit: 2021-01-21 | Discharge: 2021-01-21 | Disposition: A | Discharge status: Home / Self Care | Payer: Medicare HMO | Source: Ambulatory Visit | Attending: Nurse Practitioner | Admitting: Nurse Practitioner

## 2021-01-21 ENCOUNTER — Encounter: Payer: Self-pay | Admitting: Nurse Practitioner

## 2021-01-21 VITALS — HR 65

## 2021-01-21 DIAGNOSIS — R051 Acute cough: Secondary | ICD-10-CM

## 2021-01-21 DIAGNOSIS — R0602 Shortness of breath: Secondary | ICD-10-CM | POA: Diagnosis not present

## 2021-01-21 DIAGNOSIS — J449 Chronic obstructive pulmonary disease, unspecified: Secondary | ICD-10-CM | POA: Diagnosis not present

## 2021-01-21 DIAGNOSIS — R059 Cough, unspecified: Secondary | ICD-10-CM | POA: Diagnosis not present

## 2021-01-21 DIAGNOSIS — R5383 Other fatigue: Secondary | ICD-10-CM | POA: Diagnosis not present

## 2021-01-21 MED ORDER — AZITHROMYCIN 250 MG PO TABS
ORAL_TABLET | ORAL | 0 refills | Status: AC
  Filled 2021-01-21: qty 6, 5d supply, fill #0

## 2021-01-21 NOTE — Progress Notes (Signed)
Subjective:    Patient ID: Evan [name], male    DOB: 06/24/1927, 87 y.o.   MRN: [id number]  HPI: Evan [name] is a 87 y.o. male presenting virtually with daughter for cough.  Chief Complaint  Patient presents with   Cough   UPPER RESPIRATORY TRACT INFECTION Onset: ~ 1 weeks COVID-19 testing history: COVID-19 vaccination status: Fever: no Body aches: yes Chills: yes Cough: yes Shortness of breath: no Wheezing: no Chest pain: yes, with cough and with deep breath Chest tightness: yes Chest congestion: yes Nasal congestion: no Runny nose: no Post nasal drip: no Sneezing: no Sore throat: no Swollen glands: no Sinus pressure: no Headache: no Face pain: no Toothache: no Ear pain: no  Ear pressure: no  Eyes red/itching:no Eye drainage/crusting: no  Nausea: no  Vomiting: no Diarrhea: no  Change in appetite: no  Loss of taste/smell: no  Rash: no Fatigue: yes Sick contacts: no Strep contacts: no  Context: stable Treatments attempted:  Relief with OTC medications:  Allergies  Allergen Reactions   Feldene [Piroxicam] Other (See Comments)    REACTION: Blisters   Neomycin-Polymyxin-Hc Itching   Statins Other (See Comments)    Myalgias   Doxycycline Other (See Comments)    REACTION:  unknown   Latex Rash   Tequin Anxiety   Valium Other (See Comments)    REACTION:  unknown    Outpatient Encounter Medications as of 01/21/2021  Medication Sig   azithromycin (ZITHROMAX) 250 MG tablet Take 2 tablets on day 1, then 1 tablet daily on days 2 through 5   acetaminophen (TYLENOL) 325 MG tablet Take 1-2 tablets (325-650 mg total) by mouth every 4 (four) hours as needed for mild pain.   ALPRAZolam (XANAX) 0.5 MG tablet Take 0.5 tablets (0.25 mg total) by mouth 3 (three) times daily as needed for anxiety.   FLUoxetine (PROZAC) 20 MG capsule Take 1 capsule (20 mg total) by mouth daily.   furosemide (LASIX) 20 MG tablet TAKE 1 TABLET DAILY. MAY TAKE AN EXTRA 20 MG TABLET  IF INCREASED SHORTNESS OF BREATH OR WEIGHT GAIN.   hydrOXYzine (ATARAX/VISTARIL) 10 MG tablet Take 1 tablet (10 mg total) by mouth at bedtime.   Lifitegrast (XIIDRA) 5 % SOLN Place 1-2 drops into both eyes daily.    magnesium oxide (MAG-OX) 400 (240 Mg) MG tablet Take 1 tablet (400 mg total) by mouth daily.   metoprolol tartrate (LOPRESSOR) 25 MG tablet TAKE ONE-HALF TABLET (12.5 MG TOTAL) BY MOUTH 2 (TWO) TIMES DAILY.   nitroGLYCERIN (NITROSTAT) 0.4 MG SL tablet Place 1 tablet (0.4 mg total) under the tongue every 5 (five) minutes as needed for chest pain.   pantoprazole (PROTONIX) 40 MG tablet TAKE ONE (1) TABLET BY MOUTH EVERY DAY   polyethylene glycol (MIRALAX / GLYCOLAX) 17 g packet Take 17 g by mouth daily as needed for mild constipation.   Rivaroxaban (XARELTO) 15 MG TABS tablet Take 1 tablet (15 mg total) by mouth daily with supper.   rosuvastatin (CRESTOR) 5 MG tablet TAKE 1 TABLET EVERY DAY   silodosin (RAPAFLO) 8 MG CAPS capsule Take 1 capsule (8 mg total) by mouth daily.   tacrolimus (PROTOPIC) 0.1 % ointment Apply 1 application topically 2 (two) times daily.   [DISCONTINUED] amoxicillin-clavulanate (AUGMENTIN) 500-125 MG tablet Take 1 tablet (500 mg total) by mouth 2 (two) times daily.   [DISCONTINUED] ciprofloxacin (CIPRO) 250 MG tablet Take 1 tablet by mouth twice a day   [DISCONTINUED] sulfamethoxazole-trimethoprim (BACTRIM DS)  800-160 MG tablet Take 1 tablet by mouth twice daily   No facility-administered encounter medications on file as of 01/21/2021.    Patient Active Problem List   Diagnosis Date Noted   Rib pain on right side 03/19/2020   Sundowning 07/04/2019   Bilateral hearing loss 04/16/2019   Urinary frequency 03/21/2019   Chronic right shoulder pain 12/19/2018   Weakness generalized 09/28/2018   Palliative care encounter 09/28/2018   Pressure injury of skin 08/27/2018   COPD (chronic obstructive pulmonary disease) ([place]) 08/26/2018   AMS (altered mental status)  08/22/2018   Altered mental status 08/21/2018   UTI (urinary tract infection) 08/21/2018   Abnormality of gait 08/20/2018   Prolonged Q-T interval on ECG    Dysphagia, post-stroke    Supplemental oxygen dependent    Anxiety state    Fatigue    SOB (shortness of breath)    Hyperglycemia    History of lung cancer    Depression    CKD (chronic kidney disease), stage III ([place])    Acute blood loss anemia    Fall 07/16/2018   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   Acute spontaneous 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 ([place]) L thalamic 07/14/2018   Cellulitis of arm, right 06/01/2018   Rash and nonspecific skin eruption 06/01/2018   Chest pain 02/02/2018   Thrombocytopenia ([place]) 02/02/2018   Hypokalemia 02/02/2018   Pulmonary embolism ([place]) 02/02/2018   Elevated troponin 02/02/2018   Pulmonary embolus ([place]) 02/02/2018   Non-ST elevation MI (NSTEMI) ([place]) 16/01/9603   Diastolic CHF ([place]) 54/12/8117   Chest pain due to CAD ([place]) 06/26/2017   Dyspnea 06/21/2017   Acute bronchitis 05/15/2017   Oral candidiasis 05/15/2017   Acute pericarditis    Chest pain at rest 12/20/2016   Angina pectoris ([place]) 12/20/2016   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   Pleurisy 10/11/2016   Chest wall pain 10/11/2016   Atrial fibrillation ([place]) 05/21/2016   Adenocarcinoma of right lung ([place]) 01/06/2016   GAD (generalized anxiety disorder) 11/20/2015   Essential hypertension    Cellulitis 09/04/2015   Cellulitis of left axilla    Esophageal ulcer without bleeding    Bilateral lower extremity edema 04/28/2014   BPH (benign prostatic hyperplasia) 10/08/2012   Anxiety    EKG abnormalities 09/05/2012   Tremor 09/05/2012   Chronic fatigue 09/05/2012   Arthritis    Asthma, chronic    Reflux esophagitis    S/P CABG (coronary artery bypass graft)    GERD (gastroesophageal reflux disease)    Hyperlipidemia    Hypertension     UNSPECIFIED PERIPHERAL VASCULAR DISEASE 06/16/2009    Past Medical History:  Diagnosis Date   Adenocarcinoma of right lung ([place]) 01/06/2016   Anginal chest pain at rest [facility])    Chronic non, controlled on Lorazepam   Anxiety    Arthritis    "all over"    BPH (benign prostatic hyperplasia)    CAD (coronary artery disease)    Chronic lower back pain    Complication of anesthesia    "problems making water afterwards"   Depression    DJD (degenerative joint disease)    Esophageal ulcer without bleeding    bid ppi indefinitely   Family history of adverse reaction to anesthesia    "children w/PONV"   GERD (gastroesophageal reflux disease)    Headache    "couple/week maybe" (09/04/2015)   Hemochromatosis    Possible, elevated iron stores, hook-like osteophytes on hand films,  normal LFTs   Hepatitis    "yellow jaundice as a baby"   History of ASCVD    MULTIVESSEL   Hyperlipidemia    Hypertension    Osteoarthritis    Pneumonia    "several times; got a little now" (09/04/2015)   Rheumatoid arthritis ([place])    Subdural hematoma    small after fall 09/2016-plavix held, neurosurgery consulted.  Asymptomatic.     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[place]) 02/2018   on Lovenox lifelong since he failed PO Eliquis    Relevant past medical, surgical, family and social history reviewed and updated as indicated. Interim medical history since our last visit reviewed.  Review of Systems Per HPI unless specifically indicated above     Objective:    Pulse 65   SpO2 92%   Wt Readings from Last 3 Encounters:  11/10/20 162 lb (73.5 kg)  07/06/20 169 lb (76.7 kg)  06/22/20 162 lb (73.5 kg)    Physical Exam Physical examination unable to be performed due to lack of equipment.  Patient talking in complete sentences during telemedicine visit without accessory muscle use.     Assessment & Plan:  1. Acute cough Acute.  Suspect bacterial cause.  Start azithromycin and obtain chest x-ray to evaluate for pneumonia.   Push fluids.  Also start Mucinex.  Follow up pending results.   - DG Chest 2 View; Future - azithromycin (ZITHROMAX) 250 MG tablet; Take 2 tablets on day 1, then 1 tablet daily on days 2 through 5  Dispense: 6 tablet; Refill: 0   Follow up plan: No follow-ups on file.  This visit was completed via telephone due to the restrictions of the COVID-19 pandemic. All issues as above were discussed and addressed but no physical exam was performed. If it was felt that the patient should be evaluated in the office, they were directed there. The patient verbally consented to this visit. Patient was unable to complete an audio/visual visit due to Lack of equipment. Location of the patient: home Location of the provider: work Those involved with this call:  Provider: Noemi Chapel, DNP, FNP-C CMA: n/a Front Desk/Registration: Vevelyn Pat  Time spent on call:  8 minutes on the phone discussing health concerns. 15 minutes total spent in review of patient's record and preparation of their chart. I verified patient identity using two factors (patient name and date of birth). Patient consents verbally to being seen via telemedicine visit today.

## 2021-01-22 ENCOUNTER — Telehealth: Payer: Self-pay

## 2021-01-22 NOTE — Telephone Encounter (Signed)
Call placed to patient daughter [name] and discuss patient CXR.

## 2021-01-22 NOTE — Telephone Encounter (Signed)
Pt's daughter called to inquire about this pt's chest xray results. Please advise.  Cb#: [phone]

## 2021-01-24 IMAGING — CT CT ANGIOGRAPHY CHEST
2 of 6 series · 18 of 46 positions shown · IV contrast (APPLIED)
Comparison: 06/01/2018

CLINICAL DATA: Shortness of breath. Clinical concern for pulmonary
embolus.

EXAM:
CT ANGIOGRAPHY CHEST WITH CONTRAST
TECHNIQUE: Multidetector CT imaging of the chest was performed using the
standard protocol during bolus administration of intravenous
contrast. Multiplanar CT image reconstructions and MIPs were
obtained to evaluate the vascular anatomy.
CONTRAST:  80mL OMNIPAQUE IOHEXOL 350 MG/ML SOLN

[Series 7: thins · axial · 0.71mm/px · z∈[+1077,+1327]mm · 15 of 391 slices shown]
[im 17/391  lung]
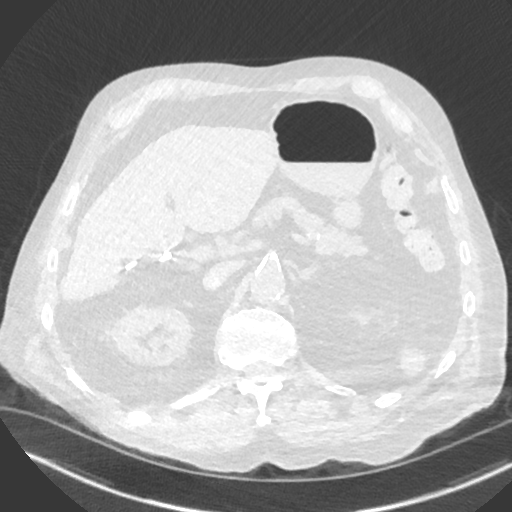
[im 51/391  soft-tissue]
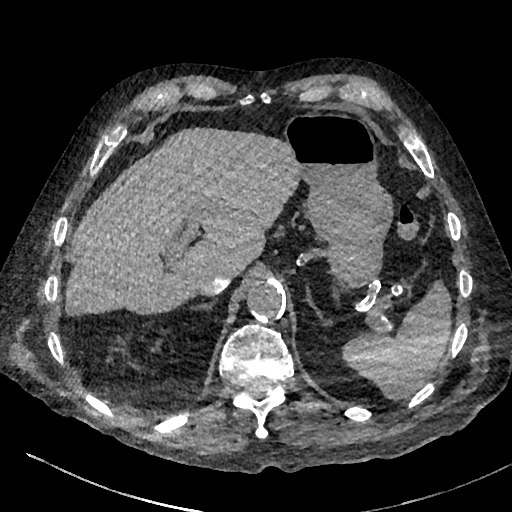
[im 68/391  lung]
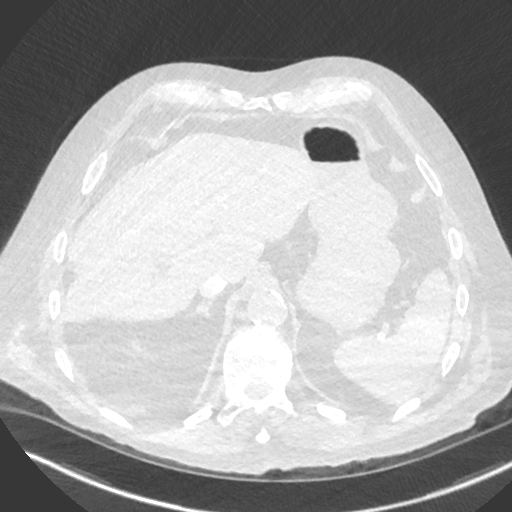
[im 102/391  soft-tissue]
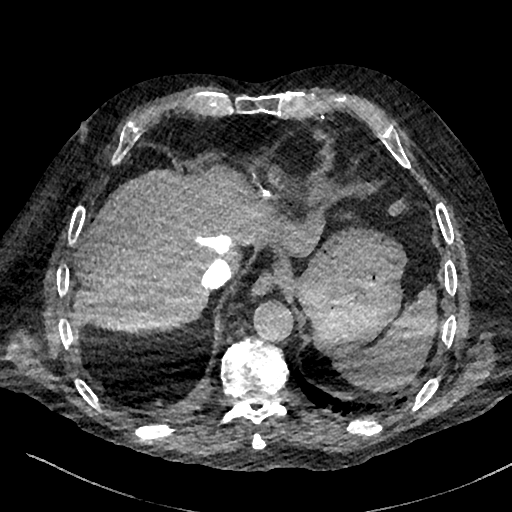
[im 119/391  lung]
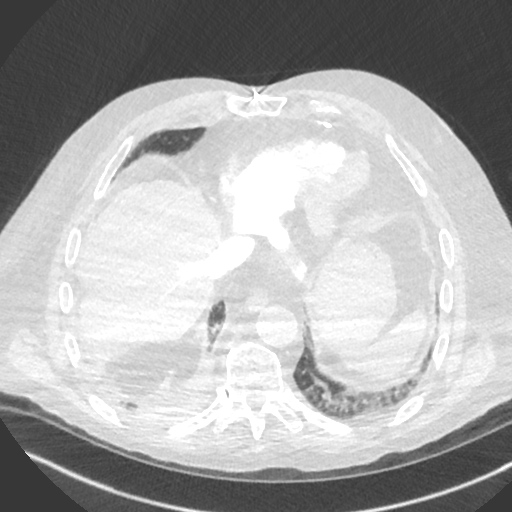
[im 153/391  soft-tissue]
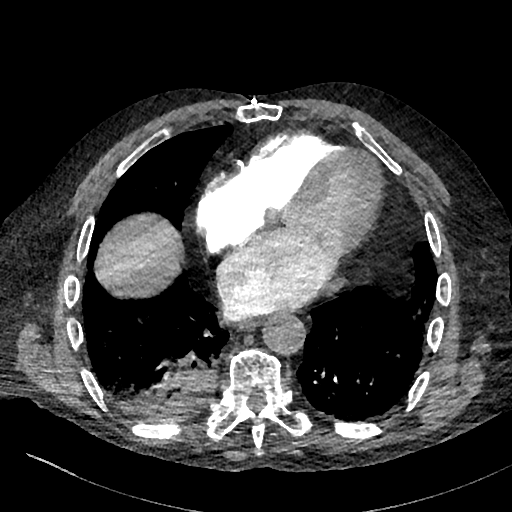
[im 170/391  lung]
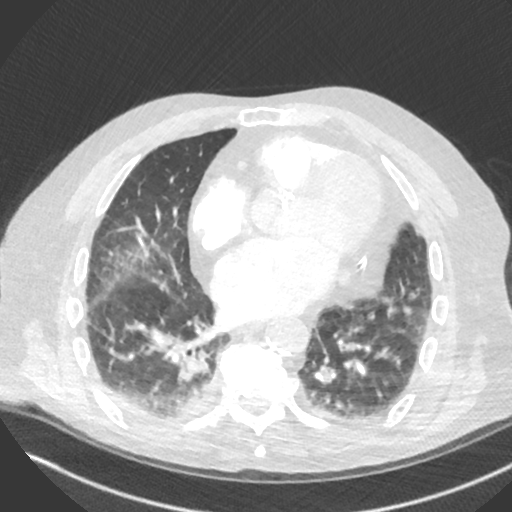
[im 204/391  soft-tissue]
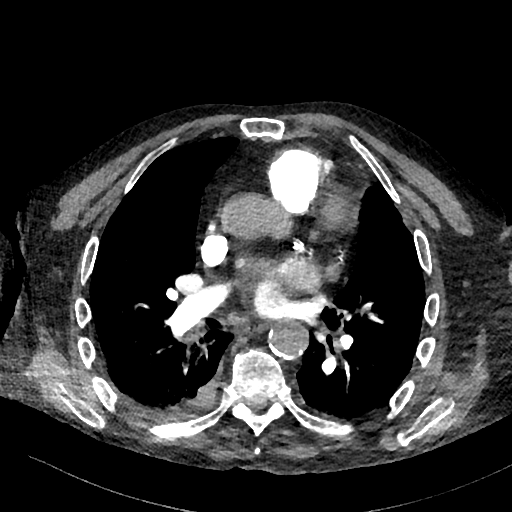
[im 221/391  lung]
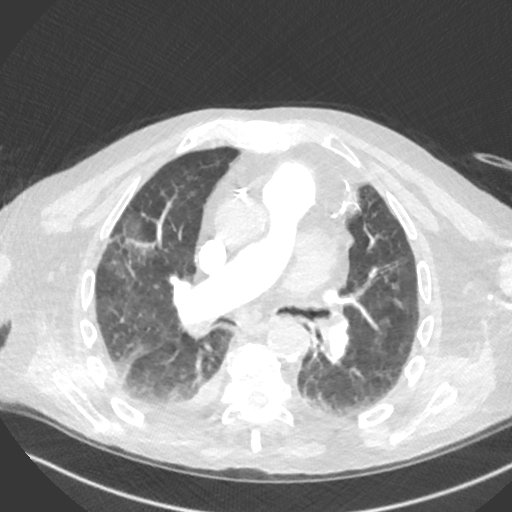
[im 238/391  soft-tissue]
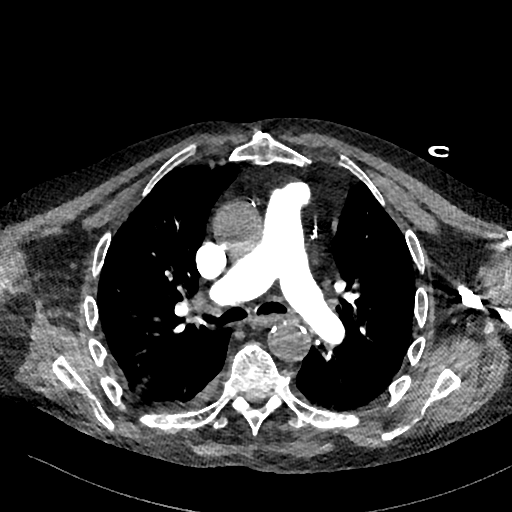
[im 272/391  lung]
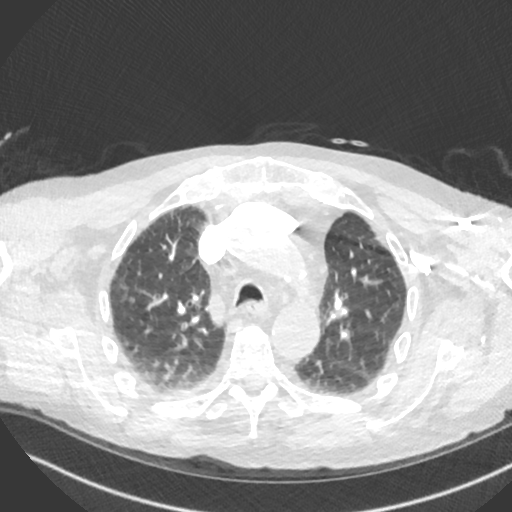
[im 289/391  soft-tissue]
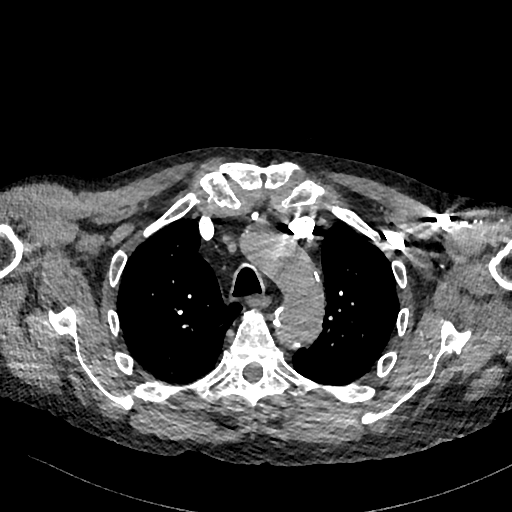
[im 323/391  lung]
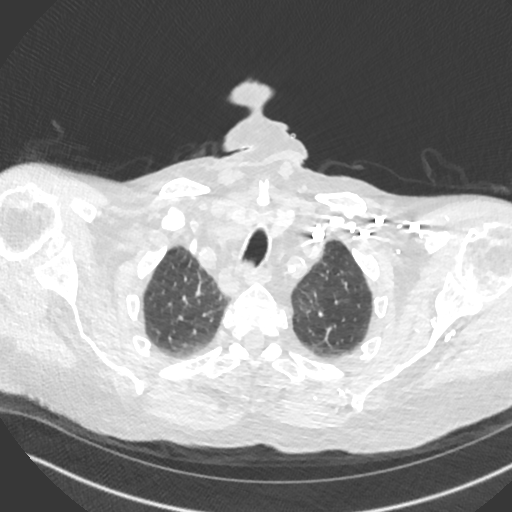
[im 340/391  soft-tissue]
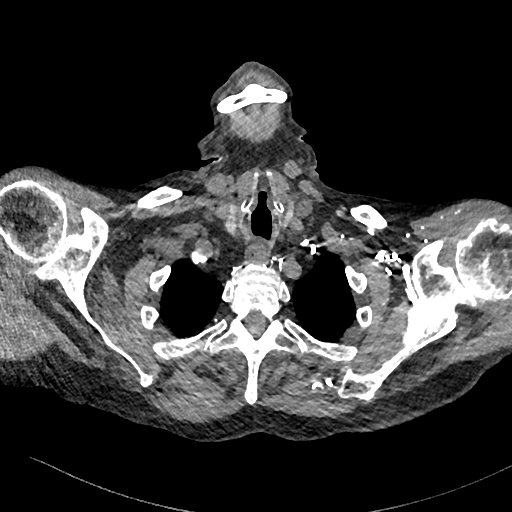
[im 374/391  lung]
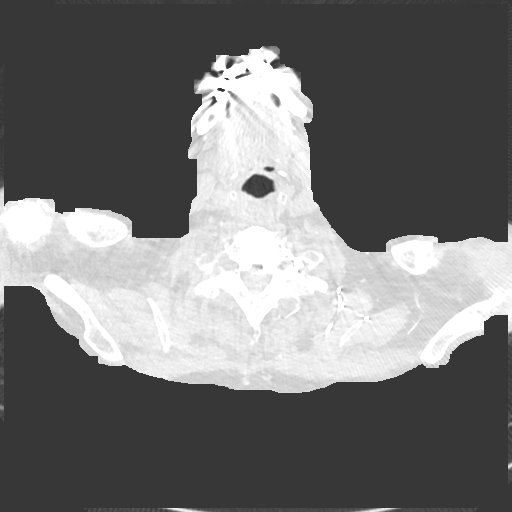

[Series 9: cor · coronal · 0.52mm/px · 3 of 138 slices shown]
[im 35/138  soft-tissue]
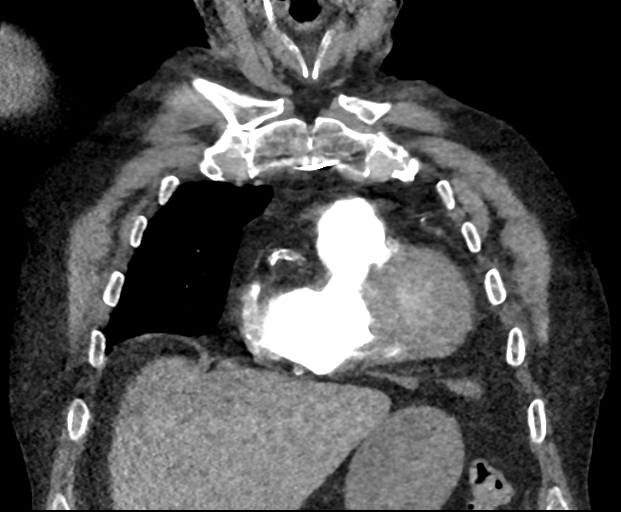
[im 69/138  soft-tissue]
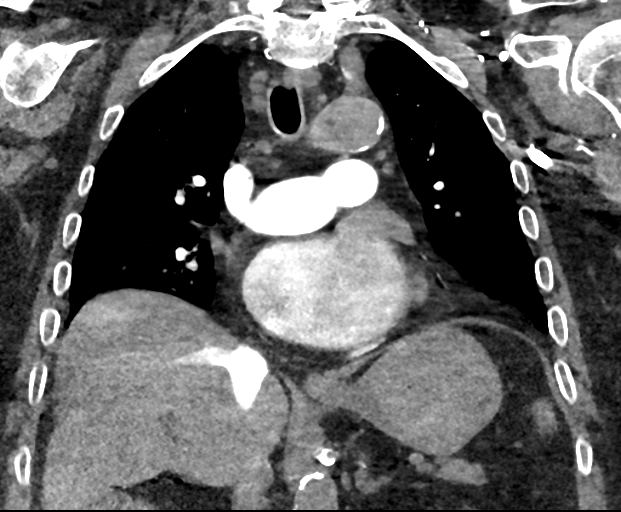
[im 103/138  soft-tissue]
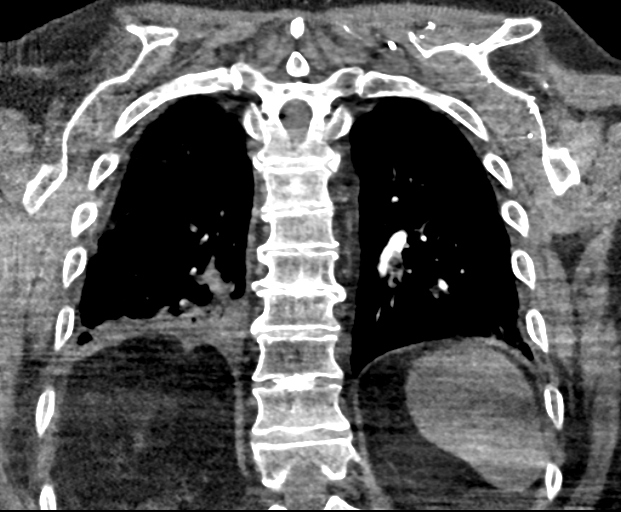

[18 of 46 positions shown; findings below may reference images not displayed]

FINDINGS: Cardiovascular: The heart size is normal. No substantial pericardial
effusion. Coronary artery calcification is evident. Atherosclerotic
calcification is noted in the wall of the thoracic aorta. No filling
defects in the opacified pulmonary arteries to suggest the presence
of an acute pulmonary embolus.

Mediastinum/Nodes: 9 mm short axis lymph node measured in the region
of the right thoracic inlet previously is 10 mm today. 10 mm short
axis subcarinal lymph node is stable. Abnormal soft tissue in the
inferior right hilum was not well demonstrated on previous
noncontrast exam but appears progressive when comparing to
02/02/2018. No evidence for left hilar lymphadenopathy.

Lungs/Pleura: Secretions/debris noted in the lower trachea and right
mainstem bronchus. Patchy ground-glass attenuation in the right
midlung this similar to prior. Posterior right lower lobe
collapse/consolidation is mildly progressive in the interval.
Atelectatic changes noted posterior left lower lobe. Tiny
perifissural nodule in the left lung (62/8) is stable. Tiny right
pleural effusion evident.

Upper Abdomen: Unremarkable.

Musculoskeletal: No worrisome lytic or sclerotic osseous
abnormality.

Review of the MIP images confirms the above findings.
IMPRESSION: 1. No CT evidence for acute pulmonary embolus.
2. Interval progression of abnormal soft tissue in the inferior
right hilum. Recurrent disease a concern.
3. Similar to mild progression of posterior right lower lobe
collapse/consolidative change, likely radiation scarring.
4. Stable mediastinal lymph nodes.
5.  Aortic Atherosclerois ([id number])

## 2021-01-24 IMAGING — DX PORTABLE CHEST - 1 VIEW
1 series · 1 of 1 positions shown · non-contrast
Comparison: 07/19/2018

CLINICAL DATA: Shortness of breath.

EXAM:
PORTABLE CHEST 1 VIEW

[chest ap]
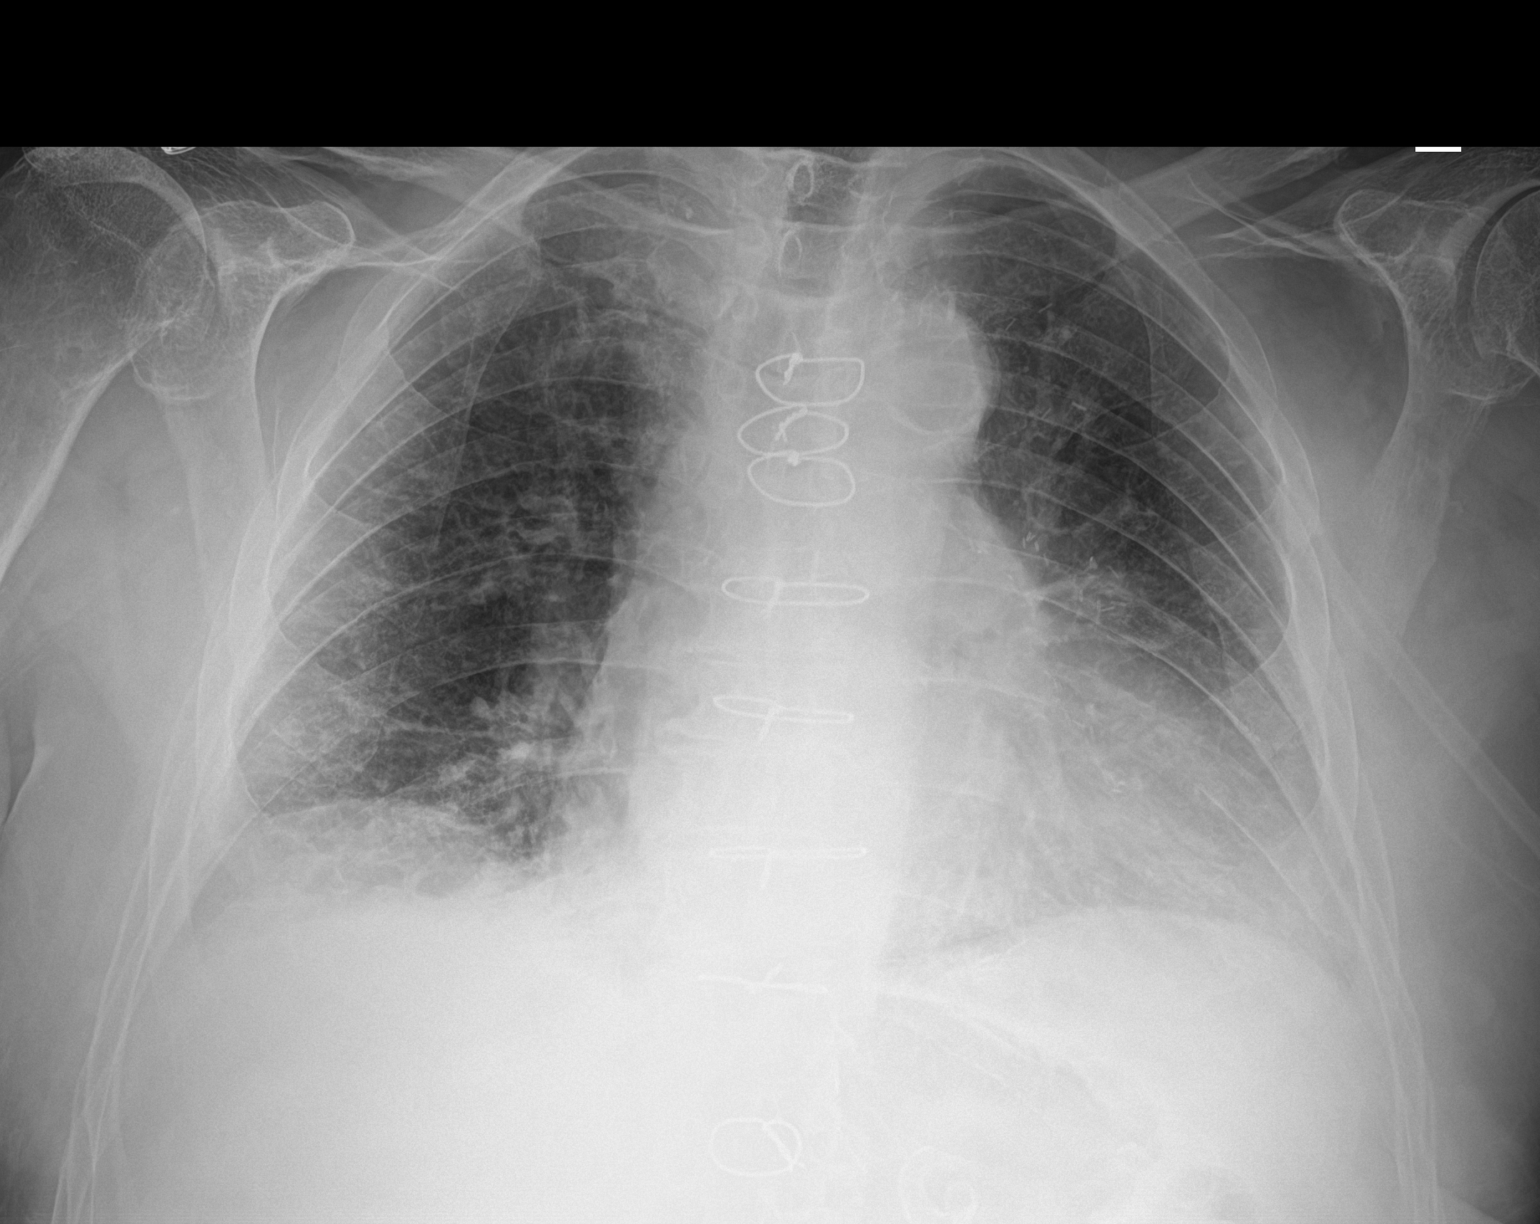

[1 of 1 positions shown; findings below may reference images not displayed]

FINDINGS: Prior sternotomy is again noted. The cardiac silhouette remains
mildly enlarged. Aortic atherosclerosis is noted. The lungs are
slightly better inflated than on the prior study. Pulmonary vascular
congestion and mild bilateral interstitial opacities are mildly
improved. There is persistent blunting of the costophrenic angles
bilaterally which may reflect small pleural effusions. No
pneumothorax is identified.
IMPRESSION: Mild improvement of pulmonary vascular congestion/mild edema.

## 2021-01-26 ENCOUNTER — Telehealth: Payer: Self-pay | Admitting: Family Medicine

## 2021-01-26 NOTE — Chronic Care Management (AMB) (Signed)
  Chronic Care Management   Note  01/26/2021 Name: Evan [name] MRN: [id number] DOB: 11-24-1927  Evan [name] is a 87 y.o. year old male who is a primary care patient of Pickard, Cammie Mcgee, MD. I reached out to [name] by phone today in response to a referral sent by Evan [name] PCP, [name], MD.   Evan [name] was given information about Chronic Care Management services today including:  CCM service includes personalized support from designated clinical staff supervised by his physician, including individualized plan of care and coordination with other care providers 24/7 contact phone numbers for assistance for urgent and routine care needs. Service will only be billed when office clinical staff spend 20 minutes or more in a month to coordinate care. Only one practitioner may furnish and bill the service in a calendar month. The patient may stop CCM services at any time (effective at the end of the month) by phone call to the office staff.   Evan [name]  verbally agreed to assistance and services provided by embedded care coordination/care management team today.  Follow up plan:  Evan [name] Secretary/administrator

## 2021-01-27 ENCOUNTER — Telehealth: Payer: Self-pay | Admitting: Pharmacist

## 2021-01-27 NOTE — Progress Notes (Signed)
Chronic Care Management Pharmacy Assistant   Name: Evan [name]  MRN: [id number] DOB: 11-13-1927  Evan [name] is an 87 y.o. year old male who presents for his initial CCM visit with the clinical pharmacist.  Reason for Encounter: Chart Prep    Conditions to be addressed/monitored: HTN, AFIB, CHF,GERD,CKD,HLD.  Primary concerns for visit include: HTN  Recent office visits:  01/21/21 Eulogio Bear, NP. For acute cough. STARTED Azithromycin 250 mg pack. COMPLETED Ciprofloxacin, Bactrim-DS, and Augmentin.  11/10/20 [name] For malodorous urine. No medication changes.   Recent consult visits:  11/17/20 Urology Ardis Hughs. For urgency of urination. No information given. 10/23/20 Urology Festus Aloe. For urgency of urination. No information given. 10/02/20 Urology Rosana Hoes. Jennaya L. No information given. 09/18/20 Urology Marton Redwood III For urgency of urination. No information given. 08/24/20 Urology Rosana Hoes. Jennaya L. No information given. 08/20/20 Urology Festus Aloe. For urgency of urination. No information given.  Hospital visits:  None in the last six months.  Medication History: Rosuvastatin 5 mg 11/04/20 90 DS.  Medications: Outpatient Encounter Medications as of 01/27/2021  Medication Sig   acetaminophen (TYLENOL) 325 MG tablet Take 1-2 tablets (325-650 mg total) by mouth every 4 (four) hours as needed for mild pain.   ALPRAZolam (XANAX) 0.5 MG tablet Take 0.5 tablets (0.25 mg total) by mouth 3 (three) times daily as needed for anxiety.   FLUoxetine (PROZAC) 20 MG capsule Take 1 capsule (20 mg total) by mouth daily.   furosemide (LASIX) 20 MG tablet TAKE 1 TABLET DAILY. MAY TAKE AN EXTRA 20 MG TABLET IF INCREASED SHORTNESS OF BREATH OR WEIGHT GAIN.   hydrOXYzine (ATARAX/VISTARIL) 10 MG tablet Take 1 tablet (10 mg total) by mouth at bedtime.   Lifitegrast (XIIDRA) 5 % SOLN Place 1-2 drops into both eyes daily.    magnesium oxide (MAG-OX) 400 (240  Mg) MG tablet Take 1 tablet (400 mg total) by mouth daily.   metoprolol tartrate (LOPRESSOR) 25 MG tablet TAKE ONE-HALF TABLET (12.5 MG TOTAL) BY MOUTH 2 (TWO) TIMES DAILY.   nitroGLYCERIN (NITROSTAT) 0.4 MG SL tablet Place 1 tablet (0.4 mg total) under the tongue every 5 (five) minutes as needed for chest pain.   pantoprazole (PROTONIX) 40 MG tablet TAKE ONE (1) TABLET BY MOUTH EVERY DAY   polyethylene glycol (MIRALAX / GLYCOLAX) 17 g packet Take 17 g by mouth daily as needed for mild constipation.   Rivaroxaban (XARELTO) 15 MG TABS tablet Take 1 tablet (15 mg total) by mouth daily with supper.   rosuvastatin (CRESTOR) 5 MG tablet TAKE 1 TABLET EVERY DAY   silodosin (RAPAFLO) 8 MG CAPS capsule Take 1 capsule (8 mg total) by mouth daily.   tacrolimus (PROTOPIC) 0.1 % ointment Apply 1 application topically 2 (two) times daily.   No facility-administered encounter medications on file as of 01/27/2021.   Have you seen any other providers since your last visit? Patients daughter stated no.  Any changes in your medications or health? Patients daughter stated no.  Any side effects from any medications? Patients daughter stated no.  Do you have an symptoms or problems not managed by your medications? Patients daughter stated no.  Any concerns about your health right now? Patients daughter stated she is concerned about his anxiety.  Has your provider asked that you check blood pressure, blood sugar, or follow special diet at home? Patients daughter stated he follows a low sodium diet.  Do you get any type of exercise  on a regular basis? Patients daughter stated he does chair exercises three-four times a week.   Can you think of a goal you would like to reach for your health? Patients daughter stated she would like for him to be stronger in his legs and walk better.  Do you have any problems getting your medications? Patients daughter stated his Xarelto, and Tacrolimus is expensive.   Is there  anything that you would like to discuss during the appointment? Patients daughter stated no.   Please bring medications and supplements to appointment, patients daughter reminded of his appointment on 01/28/21 at 3:30 pm.   Follow-Up:Pharmacist Review  Charlann Lange, [place] Pharmacist Assistant [phone]

## 2021-01-28 ENCOUNTER — Ambulatory Visit (INDEPENDENT_AMBULATORY_CARE_PROVIDER_SITE_OTHER): Payer: Medicare HMO | Admitting: Pharmacist

## 2021-01-28 DIAGNOSIS — R0602 Shortness of breath: Secondary | ICD-10-CM

## 2021-01-28 DIAGNOSIS — N183 Chronic kidney disease, stage 3 unspecified: Secondary | ICD-10-CM

## 2021-01-28 DIAGNOSIS — F339 Major depressive disorder, recurrent, unspecified: Secondary | ICD-10-CM

## 2021-01-28 NOTE — Progress Notes (Signed)
Chronic Care Management Pharmacy Note  02/01/2021 Name:  Evan [name] MRN:  [id number] DOB:  February 19, 1928  Summary: Initial visit with PharmD.  Is having some SOB, no maintenance inhaler.  Reports sleep is sometimes difficult, however does not like to take his alprazolam due to next day effects.  Recommendations/Changes made from today's visit: None at this time - monitor  Plan: FU 6 months   Subjective: Evan [name] is an 87 y.o. year old male who is a primary patient of Pickard, Cammie Mcgee, MD.  The CCM team was consulted for assistance with disease management and care coordination needs.    Engaged with patient by telephone for initial visit in response to provider referral for pharmacy case management and/or care coordination services.   Consent to Services:  The patient was given the following information about Chronic Care Management services today, agreed to services, and gave verbal consent: 1. CCM service includes personalized support from designated clinical staff supervised by the primary care provider, including individualized plan of care and coordination with other care providers 2. 24/7 contact phone numbers for assistance for urgent and routine care needs. 3. Service will only be billed when office clinical staff spend 20 minutes or more in a month to coordinate care. 4. Only one practitioner may furnish and bill the service in a calendar month. 5.The patient may stop CCM services at any time (effective at the end of the month) by phone call to the office staff. 6. The patient will be responsible for cost sharing (co-pay) of up to 20% of the service fee (after annual deductible is met). Patient agreed to services and consent obtained.  Patient Care Team: [name], MD as PCP - General (Family Medicine) Jettie Booze, MD as PCP - Cardiology (Cardiology) Edythe Clarity, [facility] as Pharmacist (Pharmacist)  Recent office visits:  01/21/21 Eulogio Bear,  NP. For acute cough. STARTED Azithromycin 250 mg pack. COMPLETED Ciprofloxacin, Bactrim-DS, and Augmentin.  11/10/20 [name] For malodorous urine. No medication changes.    Recent consult visits:  11/17/20 Urology Ardis Hughs. For urgency of urination. No information given. 10/23/20 Urology Festus Aloe. For urgency of urination. No information given. 10/02/20 Urology Rosana Hoes. Jennaya L. No information given. 09/18/20 Urology Marton Redwood III For urgency of urination. No information given. 08/24/20 Urology Rosana Hoes. Jennaya L. No information given. 08/20/20 Urology Festus Aloe. For urgency of urination. No information given.   Hospital visits:  None in the last six months.   Medication History: Rosuvastatin 5 mg 11/04/20 90 DS.  Objective:  Lab Results  Component Value Date   CREATININE 1.33 (H) 06/22/2020   BUN 26 (H) 06/22/2020   GFR 56.40 (L) 06/21/2017   GFRNONAA 47 (L) 06/22/2020   GFRAA 54 (L) 06/22/2020   NA 139 06/22/2020   K 4.0 06/22/2020   CALCIUM 8.8 06/22/2020   CO2 22 06/22/2020   GLUCOSE 93 06/22/2020    Lab Results  Component Value Date/Time   HGBA1C 5.3 12/02/2016 04:32 PM   GFR 56.40 (L) 06/21/2017 04:17 PM   GFR 63.82 06/14/2017 10:10 AM    Last diabetic Eye exam: No results found for: HMDIABEYEEXA  Last diabetic Foot exam: No results found for: HMDIABFOOTEX   Lab Results  Component Value Date   CHOL 91 (L) 06/22/2020   HDL 35 (L) 06/22/2020   LDLCALC 41 06/22/2020   TRIG 66 06/22/2020   CHOLHDL 2.6 06/22/2020    Hepatic Function Latest Ref Rng &  Units 06/22/2020 03/13/2020 10/14/2019  Total Protein 6.0 - 8.5 g/dL 6.4 6.2 6.0(L)  Albumin 3.5 - 4.6 g/dL 3.9 - -  AST 0 - 40 IU/L 19 12 14   ALT 0 - 44 IU/L 10 6(L) 8(L)  Alk Phosphatase 44 - 121 IU/L 132(H) - -  Total Bilirubin 0.0 - 1.2 mg/dL 0.7 0.8 0.7  Bilirubin, Direct 0.1 - 0.5 mg/dL - - -    Lab Results  Component Value Date/Time   TSH 2.850 06/22/2020 09:53 AM   TSH 1.378  08/23/2018 02:36 AM   TSH 1.61 01/17/2017 11:15 AM   FREET4 0.76 12/20/2016 04:51 PM    CBC Latest Ref Rng & Units 06/22/2020 03/13/2020 10/14/2019  WBC 3.4 - 10.8 x10E3/uL 6.4 6.9 5.4  Hemoglobin 13.0 - 17.7 g/dL 11.7(L) 11.1(L) 11.9(L)  Hematocrit 37.5 - 51.0 % 35.4(L) 33.2(L) 35.9(L)  Platelets 150 - 450 x10E3/uL 149(L) 131(L) 149    Lab Results  Component Value Date/Time   VD25OH 27 (L) 09/21/2015 09:40 AM    Clinical ASCVD: Yes  The ASCVD Risk score (Arnett DK, et al., 2019) failed to calculate for the following reasons:   The 2019 ASCVD risk score is only valid for ages 34 to 20   The patient has a prior MI or stroke diagnosis    Depression screen [facility] 2/9 07/06/2020 07/04/2019  Decreased Interest 0 1  Down, Depressed, Hopeless 0 1  PHQ - 2 Score 0 2  Some recent data might be hidden     Social History   Tobacco Use  Smoking Status Never   Passive exposure: Yes  Smokeless Tobacco Former   Types: Chew  Tobacco Comments   "quit chewing in the 1960s"   BP Readings from Last 3 Encounters:  11/10/20 118/66  09/03/20 112/60  07/28/20 118/60   Pulse Readings from Last 3 Encounters:  01/21/21 65  11/10/20 66  09/29/20 84   Wt Readings from Last 3 Encounters:  11/10/20 162 lb (73.5 kg)  07/06/20 169 lb (76.7 kg)  06/22/20 162 lb (73.5 kg)   BMI Readings from Last 3 Encounters:  11/10/20 31.64 kg/m  07/06/20 29.01 kg/m  06/22/20 27.81 kg/m    Assessment/Interventions: Review of patient past medical history, allergies, medications, health status, including review of consultants reports, laboratory and other test data, was performed as part of comprehensive evaluation and provision of chronic care management services.   SDOH:  (Social Determinants of Health) assessments and interventions performed: Yes  Financial Resource Strain: Low Risk    Difficulty of Paying Living Expenses: Not very hard    SDOH Screenings   Alcohol Screen: Not on file  Depression  (PHQ2-9): Low Risk    PHQ-2 Score: 0  Financial Resource Strain: Low Risk    Difficulty of Paying Living Expenses: Not very hard  Food Insecurity: Not on file  Housing: Not on file  Physical Activity: Not on file  Social Connections: Not on file  Stress: Not on file  Tobacco Use: Medium Risk   Smoking Tobacco Use: Never   Smokeless Tobacco Use: Former   Passive Exposure: Yes  Transportation Needs: Not on file    CCM Care Plan  Allergies  Allergen Reactions   Feldene [Piroxicam] Other (See Comments)    REACTION: Blisters   Neomycin-Polymyxin-Hc Itching   Statins Other (See Comments)    Myalgias   Doxycycline Other (See Comments)    REACTION:  unknown   Latex Rash   Tequin Anxiety   Valium Other (  See Comments)    REACTION:  unknown    Medications Reviewed Today     Reviewed by Eulogio Bear, NP (Nurse Practitioner) on 01/21/21 at 1344  Med List Status: <None>   Medication Order Taking? Sig Documenting Provider Last Dose Status Informant  acetaminophen (TYLENOL) 325 MG tablet [id number] No Take 1-2 tablets (325-650 mg total) by mouth every 4 (four) hours as needed for mild pain. Bary Leriche, PA-C Taking Active Child  ALPRAZolam Duanne Moron) 0.5 MG tablet [id number] No Take 0.5 tablets (0.25 mg total) by mouth 3 (three) times daily as needed for anxiety. [name], MD Taking Active Child     Discontinued 01/21/21 1254 (Completed Course)   azithromycin (ZITHROMAX) 250 MG tablet [id number] Yes Take 2 tablets on day 1, then 1 tablet daily on days 2 through 5 Noemi Chapel A, NP  Active    Discontinued 01/21/21 1254 (Completed Course)   FLUoxetine (PROZAC) 20 MG capsule [id number] No Take 1 capsule (20 mg total) by mouth daily. [name], MD Taking Active   furosemide (LASIX) 20 MG tablet [id number] No TAKE 1 TABLET DAILY. MAY TAKE AN EXTRA 20 MG TABLET IF INCREASED SHORTNESS OF BREATH OR WEIGHT GAIN. Jettie Booze, MD Taking Active   hydrOXYzine  (ATARAX/VISTARIL) 10 MG tablet [id number] No Take 1 tablet (10 mg total) by mouth at bedtime. [name], MD Taking Active   Lifitegrast Shirley Friar) 5 % SOLN [id number] No Place 1-2 drops into both eyes daily.  [provider] Taking Active Child  magnesium oxide (MAG-OX) 400 (240 Mg) MG tablet [id number]  Take 1 tablet (400 mg total) by mouth daily. [name], MD  Active   metoprolol tartrate (LOPRESSOR) 25 MG tablet [id number] No TAKE ONE-HALF TABLET (12.5 MG TOTAL) BY MOUTH 2 (TWO) TIMES DAILY. [name], MD Taking Active   nitroGLYCERIN (NITROSTAT) 0.4 MG SL tablet [id number] No Place 1 tablet (0.4 mg total) under the tongue every 5 (five) minutes as needed for chest pain. Jettie Booze, MD Taking Active   pantoprazole (PROTONIX) 40 MG tablet [id number] No TAKE ONE (1) TABLET BY MOUTH EVERY DAY Pickard, Cammie Mcgee, MD Taking Active   polyethylene glycol (MIRALAX / GLYCOLAX) 17 g packet [id number] No Take 17 g by mouth daily as needed for mild constipation. Cathlyn Parsons, PA-C Taking Active Child  Rivaroxaban (XARELTO) 15 MG TABS tablet [id number]  Take 1 tablet (15 mg total) by mouth daily with supper. [name], MD  Active   rosuvastatin (CRESTOR) 5 MG tablet [id number] No TAKE 1 TABLET EVERY DAY Jettie Booze, MD Taking Active   silodosin (RAPAFLO) 8 MG CAPS capsule [id number]  Take 1 capsule (8 mg total) by mouth daily. [name], MD  Active    Discontinued 01/21/21 1254 (Completed Course)   tacrolimus (PROTOPIC) 0.1 % ointment [id number]  Apply 1 application topically 2 (two) times daily. [name], MD  Active             Patient Active Problem List   Diagnosis Date Noted   Rib pain on right side 03/19/2020   Sundowning 07/04/2019   Bilateral hearing loss 04/16/2019   Urinary frequency 03/21/2019   Chronic right shoulder pain 12/19/2018   Weakness generalized 09/28/2018   Palliative care encounter 09/28/2018   Pressure  injury of skin 08/27/2018   COPD (chronic obstructive pulmonary disease) ([place]) 08/26/2018   AMS (altered mental status) 08/22/2018   Altered mental status 08/21/2018  UTI (urinary tract infection) 08/21/2018   Abnormality of gait 08/20/2018   Prolonged Q-T interval on ECG    Dysphagia, post-stroke    Supplemental oxygen dependent    Anxiety state    Fatigue    SOB (shortness of breath)    Hyperglycemia    History of lung cancer    Depression    CKD (chronic kidney disease), stage III (HCC)    Acute blood loss anemia    Fall 07/16/2018   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 07/14/2018   Cellulitis of arm, right 06/01/2018   Rash and nonspecific skin eruption 06/01/2018   Chest pain 02/02/2018   Thrombocytopenia ([place]) 02/02/2018   Hypokalemia 02/02/2018   Pulmonary embolism ([place]) 02/02/2018   Elevated troponin 02/02/2018   Pulmonary embolus ([place]) 02/02/2018   Non-ST elevation MI (NSTEMI) ([place]) 62/94/7654   Diastolic CHF ([place]) 65/06/5463   Chest pain due to CAD ([place]) 06/26/2017   Dyspnea 06/21/2017   Acute bronchitis 05/15/2017   Oral candidiasis 05/15/2017   Acute pericarditis    Chest pain at rest 12/20/2016   Angina pectoris ([place]) 12/20/2016   Radiation pneumonitis ([place]) 11/09/2016   Pleurisy 10/11/2016   Chest wall pain 10/11/2016   Atrial fibrillation ([place]) 05/21/2016   Adenocarcinoma of right lung ([place]) 01/06/2016   GAD (generalized anxiety disorder) 11/20/2015   Essential hypertension    Cellulitis 09/04/2015   Cellulitis of left axilla    Esophageal ulcer without bleeding    Bilateral lower extremity edema 04/28/2014   BPH (benign prostatic hyperplasia) 10/08/2012   Anxiety    EKG abnormalities 09/05/2012   Tremor 09/05/2012   Chronic fatigue 09/05/2012   Arthritis    Asthma, chronic    Reflux esophagitis     S/P CABG (coronary artery bypass graft)    GERD (gastroesophageal reflux disease)    Hyperlipidemia    Hypertension    UNSPECIFIED PERIPHERAL VASCULAR DISEASE 06/16/2009    Immunization History  Administered Date(s) Administered   Fluad Quad(high Dose 65+) 01/04/2019, 01/23/2020   Influenza Whole 01/26/2007, 01/10/2008, 12/30/2009   Influenza, High Dose Seasonal PF 12/23/2016, 02/09/2018   Influenza,inj,Quad PF,6+ Mos 12/18/2012, 01/16/2015, 12/29/2015   PFIZER(Purple Top)SARS-COV-2 Vaccination 05/10/2019, 06/04/2019, 02/05/2020   Pneumococcal Conjugate-13 06/24/2014   Pneumococcal Polysaccharide-23 01/25/2000   Td 12/27/2000, 03/09/2010   Tdap 03/09/2010    Conditions to be addressed/monitored:  Htn, Afib, CHF, Hx of PE, Asthma/COPD, GERD, HLD, GAD/Depression  Care Plan : General Pharmacy (Adult)  Updates made by Edythe Clarity, RPH since 02/01/2021 12:00 AM     Problem: CHL AMB "PATIENT-SPECIFIC PROBLEM"      Long-Range Goal: Patient-Specific Goal   Start Date: 01/28/2021  Expected End Date: 08/01/2021  This Visit's Progress: On track  Priority: High  Note:   Current Barriers:  Sleep Possible untreated COPD  Pharmacist Clinical Goal(s):  Patient will maintain control of BP as evidenced by home monitoring  through collaboration with PharmD and provider.   Interventions: 1:1 collaboration with [name], MD regarding development and update of comprehensive plan of care as evidenced by provider attestation and co-signature Inter-disciplinary care team collaboration (see longitudinal plan of care) Comprehensive medication review performed; medication list updated in electronic medical record  Hypertension (BP goal <130/80) -Controlled -Current treatment: Metoprolol 21m one-half tablet once daily -Medications previously tried: atenolol  -Current home readings: 110/58 40-70s -Current exercise habits: minimal -Denies hypotensive/hypertensive  symptoms -Educated on BP goals and benefits of medications for prevention of heart attack, stroke and kidney damage; Daily salt intake goal < 2300 mg; Importance of home blood pressure monitoring; Symptoms of hypotension and importance of maintaining adequate hydration; -Counseled to monitor BP at home weekly, document, and provide log at future appointments Home BP reported as controlled. -Recommended to continue current medication Continue to monitor at home  Hyperlipidemia: (LDL goal < 70 -Controlled -Current treatment: Rosuvastatin 54m daily - PM -Medications previously tried: none noted  -Current exercise habits: minimal -Educated on Cholesterol goals;  Benefits of statin for ASCVD risk reduction; Importance of limiting foods high in cholesterol; -Most recent LDL is normal -Recommended to continue current medication  Atrial Fibrillation (Goal: prevent stroke and major bleeding) -Controlled -Current treatment: Rate control: Metoprolol tartrate 279mone half tablet once daily Anticoagulation: Xarelto 1563maily -Medications previously tried: none noted -Home BP and HR readings: "controlled'  -Counseled on increased risk of stroke due to Afib and benefits of anticoagulation for stroke prevention; bleeding risk associated with Xarelto and importance of self-monitoring for signs/symptoms of bleeding; -No abnormal bleeding or bruising -Recommended to continue current medication  Heart Failure (Goal: manage symptoms and prevent exacerbations) -Controlled -Last ejection fraction: 60-65%  -HF type: Diastolic -Current treatment: Furosemide 36m66mily Metoprolol tartrate 25mg22m half tablet po bid -Medications previously tried: none noted  -Denies any recent swelling that does not resolve -Educated on Benefits of medications for managing symptoms and prolonging life Importance of weighing daily; if you gain more than 3 pounds in one day or 5 pounds in one week, contact  providers -Recommended to continue current medication  COPD (Goal: control symptoms and prevent exacerbations) -Not ideally controlled -Current treatment  None currently -Medications previously tried: Ventolin, Advair  -Pulmonary function testing: Pulmonary Functions Testing Results:  TLC  Date Value Ref Range Status  01/05/2016 5.47 L Final  -Exacerbations requiring treatment in last 6 months: no -Patient denies consistent use of maintenance inhaler -Frequency of rescue inhaler use: none -Counseled on Benefits of consistent maintenance inhaler use Differences between maintenance and rescue inhalers Patient may benefit from maintenance inhaler if SOB does not improve. -Continue current management for now.  Depression/Anxiety (Goal: Reduce Symptoms) -Controlled -Current treatment: Fluoxetine 36mg 40my Alprazolam 0.5mg on47malf tid prn - not taking at this time -Medications previously tried/failed: Abilify, lorazepam, Effexor -PHQ9:  PHQ9 SCORE ONLY 07/06/2020 07/04/2019 08/20/2018  PHQ-9 Total Score 0 2 8  -Educated on Benefits of medication for symptom control -Does sometimes have difficulty sleeping, does not like Xanax because it makes him drowsy the whole next day -Recommended to continue current medication Consider something like trazodone if sleep continues to be an issue.  Patient Goals/Self-Care Activities Patient will:  - take medications as prescribed as evidenced by patient report and record review focus on medication adherence by pill box check blood pressure weekly, document, and provide at future appointments  Follow Up Plan: The care management team will reach out to the patient again over the next 180 days.       Medication Assistance: None required.  Patient affirms current coverage meets needs.  Compliance/Adherence/Medication fill history: Care Gaps: Shingrix   Patient's [facility] pharmacy is:  [facility] [id number] [phone] [id number] [phone]? Yes Pt endorses 100% compliance  We discussed: Benefits of medication synchronization, packaging and delivery as well as enhanced pharmacist oversight with [employer]. Patient decided to: Continue current medication management strategy  Care Plan and Follow Up Patient Decision:  Patient agrees to Care Plan and Follow-up.  Plan: The care management team will reach out to the patient again over the next 180 days.  Beverly Milch, PharmD Clinical Pharmacist [place] [phone]

## 2021-02-01 ENCOUNTER — Telehealth: Payer: Self-pay | Admitting: *Deleted

## 2021-02-01 DIAGNOSIS — N183 Chronic kidney disease, stage 3 unspecified: Secondary | ICD-10-CM | POA: Diagnosis not present

## 2021-02-01 DIAGNOSIS — F339 Major depressive disorder, recurrent, unspecified: Secondary | ICD-10-CM

## 2021-02-01 MED ORDER — NITROGLYCERIN 0.4 MG SL SUBL: 0.4000 mg | SUBLINGUAL_TABLET | SUBLINGUAL | 2 refills | Status: DC | PRN

## 2021-02-01 NOTE — Telephone Encounter (Signed)
-----   Message from Edythe Clarity, [facility] sent at 02/01/2021  2:29 PM EDT ----- Patient requesting refill of nitroglycerin sent to his mail order pharmacy.  Thanks! Beverly Milch, PharmD Clinical Pharmacist [place] [phone]

## 2021-02-01 NOTE — Patient Instructions (Addendum)
Visit Information   Goals Addressed             This Visit's Progress    Track and Manage Fluids and Swelling-Heart Failure       Timeframe:  Long-Range Goal Priority:  High Start Date: 02/01/21                            Expected End Date:04/31/23                       Follow Up Date 05/04/21    - call office if I gain more than 2 pounds in one day or 5 pounds in one week - keep legs up while sitting - use salt in moderation - watch for swelling in feet, ankles and legs every day    Why is this important?   It is important to check your weight daily and watch how much salt and liquids you have.  It will help you to manage your heart failure.    Notes:        Patient Care Plan: General Pharmacy (Adult)     Problem Identified: Htn, Afib, CHF, Hx of PE, Asthma/COPD, GERD, HLD, GAD/Depression   Priority: High  Onset Date: 01/28/2021     Long-Range Goal: Patient-Specific Goal   Start Date: 01/28/2021  Expected End Date: 08/01/2021  This Visit's Progress: On track  Priority: High  Note:   Current Barriers:  Sleep Possible untreated COPD  Pharmacist Clinical Goal(s):  Patient will maintain control of BP as evidenced by home monitoring  through collaboration with PharmD and provider.   Interventions: 1:1 collaboration with [name], MD regarding development and update of comprehensive plan of care as evidenced by provider attestation and co-signature Inter-disciplinary care team collaboration (see longitudinal plan of care) Comprehensive medication review performed; medication list updated in electronic medical record  Hypertension (BP goal <130/80) -Controlled -Current treatment: Metoprolol 25mg  one-half tablet once daily -Medications previously tried: atenolol  -Current home readings: 110/58 40-70s -Current exercise habits: minimal -Denies hypotensive/hypertensive symptoms -Educated on BP goals and benefits of medications for prevention of heart  attack, stroke and kidney damage; Daily salt intake goal < 2300 mg; Importance of home blood pressure monitoring; Symptoms of hypotension and importance of maintaining adequate hydration; -Counseled to monitor BP at home weekly, document, and provide log at future appointments Home BP reported as controlled. -Recommended to continue current medication Continue to monitor at home  Hyperlipidemia: (LDL goal < 70 -Controlled -Current treatment: Rosuvastatin 5mg  daily - PM -Medications previously tried: none noted  -Current exercise habits: minimal -Educated on Cholesterol goals;  Benefits of statin for ASCVD risk reduction; Importance of limiting foods high in cholesterol; -Most recent LDL is normal -Recommended to continue current medication  Atrial Fibrillation (Goal: prevent stroke and major bleeding) -Controlled -Current treatment: Rate control: Metoprolol tartrate 25mg  one half tablet once daily Anticoagulation: Xarelto 15mg  daily -Medications previously tried: none noted -Home BP and HR readings: "controlled'  -Counseled on increased risk of stroke due to Afib and benefits of anticoagulation for stroke prevention; bleeding risk associated with Xarelto and importance of self-monitoring for signs/symptoms of bleeding; -No abnormal bleeding or bruising -Recommended to continue current medication  Heart Failure (Goal: manage symptoms and prevent exacerbations) -Controlled -Last ejection fraction: 60-65%  -HF type: Diastolic -Current treatment: Furosemide 20mg  daily Metoprolol tartrate 25mg  one half tablet po bid -Medications previously tried: none noted  -Denies any  recent swelling that does not resolve -Educated on Benefits of medications for managing symptoms and prolonging life Importance of weighing daily; if you gain more than 3 pounds in one day or 5 pounds in one week, contact providers -Recommended to continue current medication  COPD (Goal: control symptoms and  prevent exacerbations) -Not ideally controlled -Current treatment  None currently -Medications previously tried: Ventolin, Advair  -Pulmonary function testing: Pulmonary Functions Testing Results:  TLC  Date Value Ref Range Status  01/05/2016 5.47 L Final  -Exacerbations requiring treatment in last 6 months: no -Patient denies consistent use of maintenance inhaler -Frequency of rescue inhaler use: none -Counseled on Benefits of consistent maintenance inhaler use Differences between maintenance and rescue inhalers Patient may benefit from maintenance inhaler if SOB does not improve. -Continue current management for now.  Depression/Anxiety (Goal: Reduce Symptoms) -Controlled -Current treatment: Fluoxetine 20mg  daily Alprazolam 0.5mg  one-half tid prn - not taking at this time -Medications previously tried/failed: Abilify, lorazepam, Effexor -PHQ9:  PHQ9 SCORE ONLY 07/06/2020 07/04/2019 08/20/2018  PHQ-9 Total Score 0 2 8  -Educated on Benefits of medication for symptom control -Does sometimes have difficulty sleeping, does not like Xanax because it makes him drowsy the whole next day -Recommended to continue current medication Consider something like trazodone if sleep continues to be an issue.  Patient Goals/Self-Care Activities Patient will:  - take medications as prescribed as evidenced by patient report and record review focus on medication adherence by pill box check blood pressure weekly, document, and provide at future appointments  Follow Up Plan: The care management team will reach out to the patient again over the next 180 days.      Mr. Perrot was given information about Chronic Care Management services today including:  CCM service includes personalized support from designated clinical staff supervised by his physician, including individualized plan of care and coordination with other care providers 24/7 contact phone numbers for assistance for urgent and routine care  needs. Standard insurance, coinsurance, copays and deductibles apply for chronic care management only during months in which we provide at least 20 minutes of these services. Most insurances cover these services at 100%, however patients may be responsible for any copay, coinsurance and/or deductible if applicable. This service may help you avoid the need for more expensive face-to-face services. Only one practitioner may furnish and bill the service in a calendar month. The patient may stop CCM services at any time (effective at the end of the month) by phone call to the office staff.  Patient agreed to services and verbal consent obtained.   The patient verbalized understanding of instructions, educational materials, and care plan provided today and agreed to receive a mailed copy of patient instructions, educational materials, and care plan.  Telephone follow up appointment with pharmacy team member scheduled for: 6 months  Edythe Clarity, [place]

## 2021-02-03 ENCOUNTER — Other Ambulatory Visit (HOSPITAL_COMMUNITY): Payer: Self-pay

## 2021-02-12 DIAGNOSIS — R3915 Urgency of urination: Secondary | ICD-10-CM | POA: Diagnosis not present

## 2021-03-01 ENCOUNTER — Other Ambulatory Visit (HOSPITAL_COMMUNITY): Payer: Self-pay

## 2021-03-04 DIAGNOSIS — R3914 Feeling of incomplete bladder emptying: Secondary | ICD-10-CM | POA: Diagnosis not present

## 2021-03-04 DIAGNOSIS — R351 Nocturia: Secondary | ICD-10-CM | POA: Diagnosis not present

## 2021-03-04 DIAGNOSIS — R3915 Urgency of urination: Secondary | ICD-10-CM | POA: Diagnosis not present

## 2021-03-11 ENCOUNTER — Ambulatory Visit (INDEPENDENT_AMBULATORY_CARE_PROVIDER_SITE_OTHER): Payer: Medicare HMO | Admitting: Family Medicine

## 2021-03-11 ENCOUNTER — Encounter: Payer: Self-pay | Admitting: Family Medicine

## 2021-03-11 ENCOUNTER — Other Ambulatory Visit: Payer: Self-pay

## 2021-03-11 VITALS — BP 98/58 | HR 65 | Ht 60.0 in | Wt 165.0 lb

## 2021-03-11 DIAGNOSIS — R0600 Dyspnea, unspecified: Secondary | ICD-10-CM | POA: Diagnosis not present

## 2021-03-11 DIAGNOSIS — J449 Chronic obstructive pulmonary disease, unspecified: Secondary | ICD-10-CM | POA: Diagnosis not present

## 2021-03-11 MED ORDER — TAMSULOSIN HCL 0.4 MG PO CAPS: 0.4000 mg | ORAL_CAPSULE | Freq: Every day | ORAL | 3 refills | Status: DC

## 2021-03-11 NOTE — Progress Notes (Signed)
Subjective:    Patient ID: Evan [name], male    DOB: November 15, 1927, 87 y.o.   MRN: [id number]  HPI  Yesterday, the patient was lying down for a nap.  He woke up short of breath.  After sitting up for a while, the shortness of breath resolved.  His daughter states that he has been more short of breath recently.  He has a history of lung cancer however this has been treated with radiation therapy and is in remission.  He also has a history of pericarditis and was treated in the past with colchicine however he denies any pleurisy or chest pain or angina or chest wall tenderness.  He has a history of COPD but he denies any cough or wheezing.  He denies any fever or chills.  He has been eating more salt recently. 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   Anginal chest pain at [facility])    Chronic non, controlled on Lorazepam   Anxiety    Arthritis    "all over"    BPH (benign prostatic hyperplasia)    CAD (coronary artery disease)    Chronic lower back pain    Complication of anesthesia    "problems making water afterwards"   Depression    DJD (degenerative joint disease)    Esophageal ulcer without bleeding    bid ppi indefinitely   Family history of adverse reaction to anesthesia    "children w/PONV"   GERD (gastroesophageal reflux disease)    Headache    "couple/week maybe" (09/04/2015)   Hemochromatosis    Possible, elevated iron stores, hook-like osteophytes on hand films, normal LFTs   Hepatitis    "yellow jaundice as a baby"   History of ASCVD    MULTIVESSEL   Hyperlipidemia    Hypertension    Osteoarthritis    Pneumonia    "several times; got a little now" (09/04/2015)   Rheumatoid arthritis ([place])    Subdural hematoma    small after fall 09/2016-plavix held, neurosurgery consulted.  Asymptomatic.     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 since he failed PO Eliquis   Past Surgical History:  Procedure Laterality  Date   ARM SKIN LESION BIOPSY / EXCISION Left 09/02/2015   CATARACT EXTRACTION W/ INTRAOCULAR LENS  IMPLANT, BILATERAL Bilateral    CHOLECYSTECTOMY OPEN     CORNEAL TRANSPLANT Bilateral    "one at [facility]; one at [facility]"   [place] W/ STENT     DE stent ostium into the right radial free graft at [facility]-, 02-2007   ESOPHAGOGASTRODUODENOSCOPY N/A 11/09/2014   Procedure: ESOPHAGOGASTRODUODENOSCOPY (EGD);  Surgeon: [name], MD;  Location: [facility] ENDOSCOPY;  Service: Endoscopy;  Laterality: N/A;   INGUINAL HERNIA REPAIR     JOINT REPLACEMENT     LEFT HEART CATH AND CORS/GRAFTS ANGIOGRAPHY N/A 06/27/2017   Procedure: LEFT HEART CATH AND CORS/GRAFTS ANGIOGRAPHY;  Surgeon: [name], MD;  Location: [facility];  Service: Cardiovascular;  Laterality: N/A;   NASAL SINUS SURGERY     SHOULDER OPEN ROTATOR CUFF REPAIR Right    TOTAL KNEE ARTHROPLASTY Bilateral    Current Outpatient Medications on File Prior to Visit  Medication Sig Dispense Refill   acetaminophen (TYLENOL) 325 MG tablet Take 1-2 tablets (325-650 mg total) by mouth every 4 (four)  hours as needed for mild pain.     FLUoxetine (PROZAC) 20 MG capsule Take 1 capsule (20 mg total) by mouth daily. 90 capsule 3   furosemide (LASIX) 20 MG tablet TAKE 1 TABLET DAILY. MAY TAKE AN EXTRA 20 MG TABLET IF INCREASED SHORTNESS OF BREATH OR WEIGHT GAIN. 135 tablet 2   Lifitegrast (XIIDRA) 5 % SOLN Place 1-2 drops into both eyes daily.     magnesium oxide (MAG-OX) 400 (240 Mg) MG tablet Take 1 tablet (400 mg total) by mouth daily. 90 tablet 3   metoprolol tartrate (LOPRESSOR) 25 MG tablet TAKE ONE-HALF TABLET (12.5 MG TOTAL) BY MOUTH 2 (TWO) TIMES DAILY. 90 tablet 3   nitroGLYCERIN (NITROSTAT) 0.4 MG SL tablet Place 1 tablet (0.4 mg total) under the tongue every 5 (five) minutes as needed for chest pain. 25 tablet 2   pantoprazole (PROTONIX) 40 MG  tablet TAKE ONE (1) TABLET BY MOUTH EVERY DAY 90 tablet 3   polyethylene glycol (MIRALAX / GLYCOLAX) 17 g packet Take 17 g by mouth daily as needed for mild constipation. 14 each 0   Rivaroxaban (XARELTO) 15 MG TABS tablet Take 1 tablet (15 mg total) by mouth daily with supper. 90 tablet 1   rosuvastatin (CRESTOR) 5 MG tablet TAKE 1 TABLET EVERY DAY 90 tablet 1   silodosin (RAPAFLO) 8 MG CAPS capsule Take 1 capsule (8 mg total) by mouth daily. 90 capsule 2   tacrolimus (PROTOPIC) 0.1 % ointment Apply 1 application topically 2 (two) times daily. 100 g 3   ALPRAZolam (XANAX) 0.5 MG tablet Take 0.5 tablets (0.25 mg total) by mouth 3 (three) times daily as needed for anxiety. (Patient not taking: Reported on 01/28/2021) 30 tablet 0   hydrOXYzine (ATARAX/VISTARIL) 10 MG tablet Take 1 tablet (10 mg total) by mouth at bedtime. (Patient not taking: Reported on 01/28/2021) 30 tablet 0   No current facility-administered medications on file prior to visit.   Allergies  Allergen Reactions   Feldene [Piroxicam] Other (See Comments)    REACTION: Blisters   Neomycin-Polymyxin-Hc Itching   Statins Other (See Comments)    Myalgias   Doxycycline Other (See Comments)    REACTION:  unknown   Latex Rash   Tequin Anxiety   Valium Other (See Comments)    REACTION:  unknown   Social History   Socioeconomic History   Marital status: Widowed    Spouse name: Not on file   Number of children: Not on file   Years of education: Not on file   Highest education level: Not on file  Occupational History   Not on file  Tobacco Use   Smoking status: Never    Passive exposure: Yes   Smokeless tobacco: Former    Types: Chew   Tobacco comments:    "quit chewing in the 1960s"  Vaping Use   Vaping Use: Never used  Substance and Sexual Activity   Alcohol use: No   Drug use: No   Sexual activity: Never  Other Topics Concern   Not on file  Social History Narrative   01-05-18 Unable to ask abuse questions  family with him today.   11-012-19 Unable to ask abuse questions family with him today.   Social Determinants of Health   Financial Resource Strain: Low Risk    Difficulty of Paying Living Expenses: Not very hard  Food Insecurity: Not on file  Transportation Needs: Not on file  Physical Activity: Not on file  Stress: Not on file  Social Connections: Not on file  Intimate Partner Violence: Not on file      Review of Systems     Objective:   Physical Exam Vitals reviewed.  Constitutional:      General: He is not in acute distress.    Appearance: Normal appearance. He is not toxic-appearing or diaphoretic.  Cardiovascular:     Rate and Rhythm: Normal rate and regular rhythm.     Heart sounds: Normal heart sounds. No murmur heard.   No friction rub. No gallop.  Pulmonary:     Effort: Pulmonary effort is normal. No respiratory distress.     Breath sounds: Normal breath sounds. No wheezing, rhonchi or rales.  Musculoskeletal:     Right lower leg: No edema.     Left lower leg: No edema.  Neurological:     Mental Status: He is alert.  Right-sided hemiparesis with weakness in his right leg.  Weakness with knee flexion and extension and ankle flexion and extension.  Ataxia in the right upper arm and right lower leg.         Assessment & Plan:  Dyspnea, unspecified type - Plan: CBC with Differential/Platelet, COMPLETE METABOLIC PANEL WITH GFR, Brain natriuretic peptide I believe the patient's dyspnea might have been orthopnea due to increased fluid retention from recent sodium ingestion.  However he is back to his baseline now and states that he feels fine.  His exam is unremarkable today and is at his baseline.  Therefore both I and his daughter have decided to do no further testing other than set a CBC CMP and a BNP.  If labs are normal and stable we will not treat further unless the shortness of breath returns.

## 2021-03-12 ENCOUNTER — Other Ambulatory Visit: Payer: Self-pay | Admitting: Family Medicine

## 2021-03-12 DIAGNOSIS — R3915 Urgency of urination: Secondary | ICD-10-CM | POA: Diagnosis not present

## 2021-03-12 LAB — COMPLETE METABOLIC PANEL WITH GFR
AG Ratio: 1.3 (calc) (ref 1.0–2.5)
ALT: 34 U/L (ref 9–46)
AST: 30 U/L (ref 10–35)
Albumin: 3.5 g/dL — ABNORMAL LOW (ref 3.6–5.1)
Alkaline phosphatase (APISO): 82 U/L (ref 35–144)
BUN/Creatinine Ratio: 25 (calc) — ABNORMAL HIGH (ref 6–22)
BUN: 32 mg/dL — ABNORMAL HIGH (ref 7–25)
CO2: 22 mmol/L (ref 20–32)
Calcium: 8.6 mg/dL (ref 8.6–10.3)
Chloride: 105 mmol/L (ref 98–110)
Creat: 1.26 mg/dL — ABNORMAL HIGH (ref 0.70–1.22)
Globulin: 2.6 g/dL (calc) (ref 1.9–3.7)
Glucose, Bld: 128 mg/dL — ABNORMAL HIGH (ref 65–99)
Potassium: 3.7 mmol/L (ref 3.5–5.3)
Sodium: 138 mmol/L (ref 135–146)
Total Bilirubin: 0.6 mg/dL (ref 0.2–1.2)
Total Protein: 6.1 g/dL (ref 6.1–8.1)
eGFR: 54 mL/min/{1.73_m2} — ABNORMAL LOW (ref >=60)

## 2021-03-12 LAB — CBC WITH DIFFERENTIAL/PLATELET
Absolute Monocytes: 951 cells/uL — ABNORMAL HIGH (ref 200–950)
Basophils Absolute: 10 cells/uL (ref 0–200)
Basophils Relative: 0.1 %
Eosinophils Absolute: 10 cells/uL — ABNORMAL LOW (ref 15–500)
Eosinophils Relative: 0.1 %
HCT: 32.4 % — ABNORMAL LOW (ref 38.5–50.0)
Hemoglobin: 10.9 g/dL — ABNORMAL LOW (ref 13.2–17.1)
Lymphs Abs: 1630 cells/uL (ref 850–3900)
MCH: 32.9 pg (ref 27.0–33.0)
MCHC: 33.6 g/dL (ref 32.0–36.0)
MCV: 97.9 fL (ref 80.0–100.0)
MPV: 11.3 fL (ref 7.5–12.5)
Monocytes Relative: 9.8 %
Neutro Abs: 7100 cells/uL (ref 1500–7800)
Neutrophils Relative %: 73.2 %
Platelets: 147 10*3/uL (ref 140–400)
RBC: 3.31 10*6/uL — ABNORMAL LOW (ref 4.20–5.80)
RDW: 12.7 % (ref 11.0–15.0)
Total Lymphocyte: 16.8 %
WBC: 9.7 10*3/uL (ref 3.8–10.8)

## 2021-03-12 LAB — BRAIN NATRIURETIC PEPTIDE: Brain Natriuretic Peptide: 175 pg/mL — ABNORMAL HIGH (ref <100)

## 2021-03-18 ENCOUNTER — Telehealth: Payer: Self-pay

## 2021-03-18 NOTE — Telephone Encounter (Signed)
Fax received from [facility] indicating pt's current Silodosin will not be on the formulary for 2023. They are recommending changing pt to Finasteride, Dutasteride or Terazosin.  Please advise, thanks!

## 2021-03-19 NOTE — Telephone Encounter (Signed)
Noted  

## 2021-04-01 ENCOUNTER — Ambulatory Visit (INDEPENDENT_AMBULATORY_CARE_PROVIDER_SITE_OTHER): Payer: Medicare HMO

## 2021-04-01 ENCOUNTER — Encounter: Payer: Self-pay | Admitting: Pulmonary Disease

## 2021-04-01 ENCOUNTER — Other Ambulatory Visit: Payer: Self-pay

## 2021-04-01 ENCOUNTER — Other Ambulatory Visit (HOSPITAL_COMMUNITY): Payer: Self-pay

## 2021-04-01 ENCOUNTER — Ambulatory Visit: Payer: Medicare HMO | Admitting: Pulmonary Disease

## 2021-04-01 VITALS — BP 126/64 | HR 71 | Temp 98.0°F | Ht 63.0 in | Wt 162.2 lb

## 2021-04-01 DIAGNOSIS — M069 Rheumatoid arthritis, unspecified: Secondary | ICD-10-CM | POA: Diagnosis not present

## 2021-04-01 DIAGNOSIS — R0609 Other forms of dyspnea: Secondary | ICD-10-CM | POA: Diagnosis not present

## 2021-04-01 DIAGNOSIS — R0600 Dyspnea, unspecified: Secondary | ICD-10-CM | POA: Diagnosis not present

## 2021-04-01 MED ORDER — STIOLTO RESPIMAT 2.5-2.5 MCG/ACT IN AERS
2.0000 | INHALATION_SPRAY | Freq: Every day | RESPIRATORY_TRACT | 6 refills | Status: DC
  Filled 2021-04-01: qty 4, 30d supply, fill #0

## 2021-04-01 NOTE — Progress Notes (Signed)
CXR concerning for volume overload as discussed in office today. Radiologist read as concern for pneumonia but has no infectious symptoms other than DOE, pneumonia not felt to be present clinically.

## 2021-04-01 NOTE — Progress Notes (Signed)
@Patient  ID: Evan [name], male    DOB: 1927-07-11, 87 y.o.   MRN: [id number]  Chief Complaint  Patient presents with   Follow-up    For almost two months patient states he has been having trouble breathing in the morning. Does have a productive cough as well.     Referring provider: [name], MD  HPI:   87 y.o. man whom we are seeing in consultation for evaluation of nocturnal dyspnea.  Most recent pulmonary note from Dr. Carlis Abbott in 2021 reviewed.  Most recent PCP note reviewed.  History per patient and daughter.  About 20 months of nocturnal dyspnea.  Wakes up for 5 AM.  Very short of breath.  Hard to catch breath.  He sits up and after few minutes symptoms improved.  He has minimal dyspnea through the day.  Some of this gradually worsened over time but nothing too significant.  Nontender to concerned about.  Worse on inclines or stairs.  No other time of day where things are better or worse.  No other position makes this better or worse.  No seasonal environmental factors that he can identify to account for the worsening nocturnal dyspnea.  No other alleviating or exacerbating factors.  He had a chest x-ray 01/2021 on my review interpretation reveals clear lungs, mild hyperinflation.  TTE 11/2018 with severely dilated left atrium, reduced RV function, dilated RA, elevated RVSP.  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  Spirometry in 2018 reviewed and interpreted as normal.    Questionaires / Pulmonary Flowsheets:   ACT:  No flowsheet data found.  MMRC: No flowsheet data found.  Epworth:  No flowsheet data found.  Tests:   FENO:  No results found for: NITRICOXIDE  PFT: PFT Results Latest Ref Rng & Units 01/05/2016  FVC-Pre L 3.26  FVC-Predicted Pre % 111  FVC-Post L 3.23  FVC-Predicted Post % 109  Pre FEV1/FVC % % 80  Post FEV1/FCV % % 80  FEV1-Pre L 2.61  FEV1-Predicted Pre % 131   FEV1-Post L 2.58  DLCO uncorrected ml/min/mmHg 11.48  DLCO UNC% % 44  DLCO corrected ml/min/mmHg 11.98  DLCO COR %Predicted % 46  DLVA Predicted % 56  TLC L 5.47  TLC % Predicted % 90  RV % Predicted % 86  Personally reviewed interpret as normal spirometry, lung lines within normal limits, DLCO severely reduced consistent with pulmonary vascular disease  WALK:  SIX MIN WALK 10/11/2016  Supplimental Oxygen during Test? (L/min) No  Tech Comments: Pt walked at slow pace with steady gait. Pt was unable to complete additional laps as he stated "my legs are getting weak."    Imaging: Personally reviewed and as per EMR discussion this note  Lab Results: Personally reviewed CBC    Component Value Date/Time   WBC 9.7 03/11/2021 1125   RBC 3.31 (L) 03/11/2021 1125   HGB 10.9 (L) 03/11/2021 1125   HGB 11.7 (L) 06/22/2020 0953   HGB 13.4 08/16/2016 1155   HCT 32.4 (L) 03/11/2021 1125   HCT 35.4 (L) 06/22/2020 0953   HCT 39.8 08/16/2016 1155   PLT 147 03/11/2021 1125   PLT 149 (L) 06/22/2020 0953   MCV 97.9 03/11/2021 1125   MCV 92 06/22/2020 0953   MCV 92.3 08/16/2016 1155   MCH 32.9 03/11/2021 1125   MCHC 33.6 03/11/2021 1125   RDW 12.7 03/11/2021 1125   RDW 13.5 06/22/2020 0953  RDW 13.3 08/16/2016 1155   LYMPHSABS 1,630 03/11/2021 1125   LYMPHSABS 1.3 08/16/2016 1155   MONOABS 1.1 (H) 12/24/2018 1600   MONOABS 1.2 (H) 08/16/2016 1155   EOSABS 10 (L) 03/11/2021 1125   EOSABS 0.1 08/16/2016 1155   BASOSABS 10 03/11/2021 1125   BASOSABS 0.0 08/16/2016 1155    BMET    Component Value Date/Time   NA 138 03/11/2021 1125   NA 138 06/22/2020 0953   NA 143 08/16/2016 1155   K 3.7 03/11/2021 1125   K 4.3 08/16/2016 1155   CL 105 03/11/2021 1125   CO2 22 03/11/2021 1125   CO2 24 08/16/2016 1155   GLUCOSE 128 (H) 03/11/2021 1125   GLUCOSE 88 08/16/2016 1155   GLUCOSE 120 (H) 04/20/2015 1600   BUN 32 (H) 03/11/2021 1125   BUN 24 06/22/2020 0953   BUN 14.6 08/16/2016  1155   CREATININE 1.26 (H) 03/11/2021 1125   CREATININE 1.2 08/16/2016 1155   CALCIUM 8.6 03/11/2021 1125   CALCIUM 9.0 08/16/2016 1155   GFRNONAA 47 (L) 06/22/2020 1602   GFRAA 54 (L) 06/22/2020 1602    BNP    Component Value Date/Time   BNP 175 (H) 03/11/2021 1125    ProBNP    Component Value Date/Time   PROBNP 335.0 (H) 01/23/2020 1134    Specialty Problems       Pulmonary Problems   Asthma, chronic   Adenocarcinoma of right lung (HCC)   Pleurisy   Radiation pneumonitis (HCC)   Acute bronchitis   Dyspnea   SOB (shortness of breath)   Supplemental oxygen dependent   COPD (chronic obstructive pulmonary disease) (HCC)    Allergies  Allergen Reactions   Feldene [Piroxicam] Other (See Comments)    REACTION: Blisters   Neomycin-Polymyxin-Hc Itching   Statins Other (See Comments)    Myalgias   Doxycycline Other (See Comments)    REACTION:  unknown   Latex Rash   Tequin Anxiety   Valium Other (See Comments)    REACTION:  unknown    Immunization History  Administered Date(s) Administered   Fluad Quad(high Dose 65+) 01/04/2019, 01/23/2020   Influenza Whole 01/26/2007, 01/10/2008, 12/30/2009   Influenza, High Dose Seasonal PF 12/23/2016, 02/09/2018   Influenza,inj,Quad PF,6+ Mos 12/18/2012, 01/16/2015, 12/29/2015   PFIZER(Purple Top)SARS-COV-2 Vaccination 05/10/2019, 06/04/2019, 02/05/2020   Pneumococcal Conjugate-13 06/24/2014   Pneumococcal Polysaccharide-23 01/25/2000   Td 12/27/2000, 03/09/2010   Tdap 03/09/2010    Past Medical History:  Diagnosis Date   Adenocarcinoma of right lung ([place]) 01/06/2016   Anginal chest pain at rest [facility])    Chronic non, controlled on Lorazepam   Anxiety    Arthritis    "all over"    BPH (benign prostatic hyperplasia)    CAD (coronary artery disease)    Chronic lower back pain    Complication of anesthesia    "problems making water afterwards"   Depression    DJD (degenerative joint disease)    Esophageal ulcer  without bleeding    bid ppi indefinitely   Family history of adverse reaction to anesthesia    "children w/PONV"   GERD (gastroesophageal reflux disease)    Headache    "couple/week maybe" (09/04/2015)   Hemochromatosis    Possible, elevated iron stores, hook-like osteophytes on hand films, normal LFTs   Hepatitis    "yellow jaundice as a baby"   History of ASCVD    MULTIVESSEL   Hyperlipidemia    Hypertension    Osteoarthritis  Pneumonia    "several times; got a little now" (09/04/2015)   Rheumatoid arthritis ([place])    Subdural hematoma    small after fall 09/2016-plavix held, neurosurgery consulted.  Asymptomatic.     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[place]) 02/2018   on Lovenox lifelong since he failed PO Eliquis    Tobacco History: Social History   Tobacco Use  Smoking Status Never   Passive exposure: Yes  Smokeless Tobacco Former   Types: Chew  Tobacco Comments   "quit chewing in the 1960s"   Counseling given: Not Answered Tobacco comments: "quit chewing in the 1960s"   Continue to not smoke  Outpatient Encounter Medications as of 04/01/2021  Medication Sig   acetaminophen (TYLENOL) 325 MG tablet Take 1-2 tablets (325-650 mg total) by mouth every 4 (four) hours as needed for mild pain.   ALPRAZolam (XANAX) 0.5 MG tablet Take 0.5 tablets (0.25 mg total) by mouth 3 (three) times daily as needed for anxiety.   FLUoxetine (PROZAC) 20 MG capsule Take 1 capsule (20 mg total) by mouth daily.   furosemide (LASIX) 20 MG tablet TAKE 1 TABLET DAILY. MAY TAKE AN EXTRA 20 MG TABLET IF INCREASED SHORTNESS OF BREATH OR WEIGHT GAIN.   hydrOXYzine (ATARAX/VISTARIL) 10 MG tablet Take 1 tablet (10 mg total) by mouth at bedtime.   Lifitegrast (XIIDRA) 5 % SOLN Place 1-2 drops into both eyes daily.   magnesium oxide (MAG-OX) 400 (240 Mg) MG tablet Take 1 tablet (400 mg total) by mouth daily.   metoprolol tartrate (LOPRESSOR) 25 MG tablet TAKE ONE-HALF TABLET (12.5 MG TOTAL) BY MOUTH 2 (TWO) TIMES DAILY.    nitroGLYCERIN (NITROSTAT) 0.4 MG SL tablet Place 1 tablet (0.4 mg total) under the tongue every 5 (five) minutes as needed for chest pain.   pantoprazole (PROTONIX) 40 MG tablet TAKE ONE (1) TABLET BY MOUTH EVERY DAY   polyethylene glycol (MIRALAX / GLYCOLAX) 17 g packet Take 17 g by mouth daily as needed for mild constipation.   Rivaroxaban (XARELTO) 15 MG TABS tablet Take 1 tablet (15 mg total) by mouth daily with supper.   rosuvastatin (CRESTOR) 5 MG tablet TAKE 1 TABLET EVERY DAY   tacrolimus (PROTOPIC) 0.1 % ointment Apply 1 application topically 2 (two) times daily.   tamsulosin (FLOMAX) 0.4 MG CAPS capsule Take 1 capsule (0.4 mg total) by mouth daily.   Tiotropium Bromide-Olodaterol (STIOLTO RESPIMAT) 2.5-2.5 MCG/ACT AERS Inhale 2 puffs into the lungs daily.   No facility-administered encounter medications on file as of 04/01/2021.     Review of Systems  Review of Systems  No chest pain with exertion.  No worsening lower extremity swelling.  Comprehensive review of systems otherwise negative. Physical Exam  BP 126/64 (BP Location: Right Leg, Patient Position: Sitting, Cuff Size: Normal)    Pulse 71    Temp 98 F (36.7 C) (Oral)    Ht 5\' 3"  (1.6 m)    Wt 162 lb 3.2 oz (73.6 kg)    SpO2 96%    BMI 28.73 kg/m   Wt Readings from Last 5 Encounters:  04/01/21 162 lb 3.2 oz (73.6 kg)  03/11/21 165 lb (74.8 kg)  11/10/20 162 lb (73.5 kg)  07/06/20 169 lb (76.7 kg)  06/22/20 162 lb (73.5 kg)    BMI Readings from Last 5 Encounters:  04/01/21 28.73 kg/m  03/11/21 32.22 kg/m  11/10/20 31.64 kg/m  07/06/20 29.01 kg/m  06/22/20 27.81 kg/m     Physical Exam General: Frail, sitting in wheelchair Eyes: EOMI, no  icterus Neck: Supple, no JVP Cardiovascular: Regular rate and rhythm, no murmur, trace bilateral edema of the ankle Pulmonary: Clear, normal work of breathing Abdomen: Nondistended, bowel sounds present MSK: No synovitis, no joint effusion Neuro: No focal  weakness, sensation appears intact, hard of hearing Psych: Normal mood, flat affect  Assessment & Plan:   Nocturnal dyspnea/PND: When lying supine, in the evening.  Improved rather rapidly which argues against PND.  However given nocturnal symptoms with minimal daytime symptoms in the setting of chronic severely dilated LA in 2020 high suspicion for pulmonary edema, possibly exacerbated by some disordered breathing.  Chest x-ray today.  Repeat echocardiogram ordered.  Dyspnea on exertion: Mild during the day.  Likely multifactorial related to deconditioning in the setting of his relative immobility, pulmonary hypertension suspected as demonstrated on TTE 2020, mild volume overload given chronic left atrial dilation.  Low suspicion for exacerbation of worsening cor pulmonale given no significant right-sided heart failure symptoms on exam today.  Concern for small airways disease, hyperinflation on chest imaging.  Trial of Stiolto.  Further work-up as above.   Return in about 3 months (around 06/30/2021).   Lanier Clam, MD 04/01/2021   This appointment required 65 minutes of patient care (this includes precharting, chart review, review of results, face-to-face care, etc.).

## 2021-04-01 NOTE — Patient Instructions (Addendum)
Nice to meet you!  I would worry based on your symptoms of waking up short of breath while on your back then improving when sitting up but this could be signs of fluid buildup in the lungs.  I have ordered a chest x-ray for further evaluation.  In addition, I ordered a heart ultrasound or echocardiogram to take a look at the heart.  In addition, lets try an inhaler to see if it helps with some of your symptoms during the day and at night.  Use Stiolto 2 puffs every morning.  If the inhaler is too expensive, please call us.  Return to clinic in 3 months or sooner as needed with Dr. Silas Flood

## 2021-04-05 ENCOUNTER — Other Ambulatory Visit (HOSPITAL_COMMUNITY): Payer: Self-pay

## 2021-04-08 ENCOUNTER — Other Ambulatory Visit: Payer: Self-pay

## 2021-04-08 ENCOUNTER — Encounter: Payer: Self-pay | Admitting: Family Medicine

## 2021-04-08 ENCOUNTER — Ambulatory Visit (INDEPENDENT_AMBULATORY_CARE_PROVIDER_SITE_OTHER): Payer: Medicare HMO | Admitting: Family Medicine

## 2021-04-08 VITALS — BP 116/84 | HR 64 | Ht 63.0 in

## 2021-04-08 DIAGNOSIS — R0600 Dyspnea, unspecified: Secondary | ICD-10-CM

## 2021-04-08 DIAGNOSIS — R0601 Orthopnea: Secondary | ICD-10-CM | POA: Diagnosis not present

## 2021-04-08 DIAGNOSIS — Z23 Encounter for immunization: Secondary | ICD-10-CM

## 2021-04-08 DIAGNOSIS — M069 Rheumatoid arthritis, unspecified: Secondary | ICD-10-CM | POA: Diagnosis not present

## 2021-04-08 NOTE — Progress Notes (Signed)
Subjective:    Patient ID: Evan [name], male    DOB: 1927-04-09, 88 y.o.   MRN: [id number]  HPI  Recent visit with Pulmonology-A/P I would worry based on your symptoms of waking up short of breath while on your back then improving when sitting up but this could be signs of fluid buildup in the lungs.   I have ordered a chest x-ray for further evaluation.  In addition, I ordered a heart ultrasound or echocardiogram to take a look at the heart.   In addition, lets try an inhaler to see if it helps with some of your symptoms during the day and at night.   Use Stiolto 2 puffs every morning.   If the inhaler is too expensive, please call us.   Return to clinic in 3 months or sooner as needed with Dr. Silas Flood  Chest x-ray showed possible bronchopneumonia however clinical impression was that this was due to fluid overload and not clinical pneumonia.  Therefore the patient was referred back to me to discuss fluid overload.  He is taking Lasix 20 mg daily.  Previously was taking 40 mg on Monday Wednesday and Friday but this was discontinued due to dehydration.  Daughter states that he is having 2-3 episodes a week of orthopnea.  His breathing improves when he sits up.  On exam today he has right basilar Rales and crackles.  Left lung sounds clear.  This is a chronic finding for the patient.  There is very little edema in his legs.  He is in normal sinus rhythm.  He denies any chest pain.  He denies any shortness of breath currently.  He is sedentary and therefore he denies dyspnea on exertion.  However he does frequently feel shortness of breath when supine.  BNP was only slightly elevated at 175.  Past Medical History:  Diagnosis Date   Adenocarcinoma of right lung ([place]) 01/06/2016   Anginal chest pain at rest [facility])    Chronic non, controlled on Lorazepam   Anxiety    Arthritis    "all over"    BPH (benign prostatic hyperplasia)    CAD (coronary artery disease)    Chronic lower back pain     Complication of anesthesia    "problems making water afterwards"   Depression    DJD (degenerative joint disease)    Esophageal ulcer without bleeding    bid ppi indefinitely   Family history of adverse reaction to anesthesia    "children w/PONV"   GERD (gastroesophageal reflux disease)    Headache    "couple/week maybe" (09/04/2015)   Hemochromatosis    Possible, elevated iron stores, hook-like osteophytes on hand films, normal LFTs   Hepatitis    "yellow jaundice as a baby"   History of ASCVD    MULTIVESSEL   Hyperlipidemia    Hypertension    Osteoarthritis    Pneumonia    "several times; got a little now" (09/04/2015)   Rheumatoid arthritis ([place])    Subdural hematoma    small after fall 09/2016-plavix held, neurosurgery consulted.  Asymptomatic.     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[place]) 02/2018   on Lovenox lifelong since he failed PO Eliquis   Past Surgical History:  Procedure Laterality Date   ARM SKIN LESION BIOPSY / EXCISION Left 09/02/2015   CATARACT EXTRACTION W/ INTRAOCULAR LENS  IMPLANT, BILATERAL Bilateral    CHOLECYSTECTOMY OPEN     CORNEAL TRANSPLANT Bilateral    "one at [facility]; one at [facility]  CORONARY ANGIOPLASTY WITH STENT PLACEMENT     CORONARY ARTERY BYPASS GRAFT  1999   DESCENDING AORTIC ANEURYSM REPAIR W/ STENT     DE stent ostium into the right radial free graft at OM-, 02-2007   ESOPHAGOGASTRODUODENOSCOPY N/A 11/09/2014   Procedure: ESOPHAGOGASTRODUODENOSCOPY (EGD);  Surgeon: [name], MD;  Location: [facility] ENDOSCOPY;  Service: Endoscopy;  Laterality: N/A;   INGUINAL HERNIA REPAIR     JOINT REPLACEMENT     LEFT HEART CATH AND CORS/GRAFTS ANGIOGRAPHY N/A 06/27/2017   Procedure: LEFT HEART CATH AND CORS/GRAFTS ANGIOGRAPHY;  Surgeon: [name], MD;  Location: [facility];  Service: Cardiovascular;  Laterality: N/A;   NASAL SINUS SURGERY     SHOULDER OPEN ROTATOR CUFF REPAIR Right    TOTAL KNEE ARTHROPLASTY Bilateral    Current Outpatient Medications on File  Prior to Visit  Medication Sig Dispense Refill   acetaminophen (TYLENOL) 325 MG tablet Take 1-2 tablets (325-650 mg total) by mouth every 4 (four) hours as needed for mild pain.     ALPRAZolam (XANAX) 0.5 MG tablet Take 0.5 tablets (0.25 mg total) by mouth 3 (three) times daily as needed for anxiety. 30 tablet 0   FLUoxetine (PROZAC) 20 MG capsule Take 1 capsule (20 mg total) by mouth daily. 90 capsule 3   furosemide (LASIX) 20 MG tablet TAKE 1 TABLET DAILY. MAY TAKE AN EXTRA 20 MG TABLET IF INCREASED SHORTNESS OF BREATH OR WEIGHT GAIN. 135 tablet 2   hydrOXYzine (ATARAX/VISTARIL) 10 MG tablet Take 1 tablet (10 mg total) by mouth at bedtime. 30 tablet 0   Lifitegrast (XIIDRA) 5 % SOLN Place 1-2 drops into both eyes daily.     magnesium oxide (MAG-OX) 400 (240 Mg) MG tablet Take 1 tablet (400 mg total) by mouth daily. 90 tablet 3   metoprolol tartrate (LOPRESSOR) 25 MG tablet TAKE ONE-HALF TABLET (12.5 MG TOTAL) BY MOUTH 2 (TWO) TIMES DAILY. 90 tablet 3   nitroGLYCERIN (NITROSTAT) 0.4 MG SL tablet Place 1 tablet (0.4 mg total) under the tongue every 5 (five) minutes as needed for chest pain. 25 tablet 2   pantoprazole (PROTONIX) 40 MG tablet TAKE ONE (1) TABLET BY MOUTH EVERY DAY 90 tablet 3   polyethylene glycol (MIRALAX / GLYCOLAX) 17 g packet Take 17 g by mouth daily as needed for mild constipation. 14 each 0   Rivaroxaban (XARELTO) 15 MG TABS tablet Take 1 tablet (15 mg total) by mouth daily with supper. 90 tablet 1   rosuvastatin (CRESTOR) 5 MG tablet TAKE 1 TABLET EVERY DAY 90 tablet 1   tacrolimus (PROTOPIC) 0.1 % ointment Apply 1 application topically 2 (two) times daily. 100 g 3   tamsulosin (FLOMAX) 0.4 MG CAPS capsule Take 1 capsule (0.4 mg total) by mouth daily. 90 capsule 3   Tiotropium Bromide-Olodaterol (STIOLTO RESPIMAT) 2.5-2.5 MCG/ACT AERS Inhale 2 puffs into the lungs daily. 4 g 6   No current facility-administered medications on file prior to visit.   Allergies  Allergen  Reactions   Feldene [Piroxicam] Other (See Comments)    REACTION: Blisters   Neomycin-Polymyxin-Hc Itching   Statins Other (See Comments)    Myalgias   Doxycycline Other (See Comments)    REACTION:  unknown   Latex Rash   Tequin Anxiety   Valium Other (See Comments)    REACTION:  unknown   Social History   Socioeconomic History   Marital status: Widowed    Spouse name: Not on file   Number of  children: Not on file   Years of education: Not on file   Highest education level: Not on file  Occupational History   Not on file  Tobacco Use   Smoking status: Never    Passive exposure: Yes   Smokeless tobacco: Former    Types: Chew   Tobacco comments:    "quit chewing in the 1960s"  Vaping Use   Vaping Use: Never used  Substance and Sexual Activity   Alcohol use: No   Drug use: No   Sexual activity: Never  Other Topics Concern   Not on file  Social History Narrative   01-05-18 Unable to ask abuse questions family with him today.   11-012-19 Unable to ask abuse questions family with him today.   Social Determinants of Health   Financial Resource Strain: Low Risk    Difficulty of Paying Living Expenses: Not very hard  Food Insecurity: Not on file  Transportation Needs: Not on file  Physical Activity: Not on file  Stress: Not on file  Social Connections: Not on file  Intimate Partner Violence: Not on file      Review of Systems     Objective:   Physical Exam Vitals reviewed.  Constitutional:      General: He is not in acute distress.    Appearance: Normal appearance. He is not toxic-appearing or diaphoretic.  Cardiovascular:     Rate and Rhythm: Normal rate and regular rhythm.     Heart sounds: Normal heart sounds. No murmur heard.   No friction rub. No gallop.  Pulmonary:     Effort: Pulmonary effort is normal. No respiratory distress.     Breath sounds: Normal breath sounds. No wheezing, rhonchi or rales.  Musculoskeletal:     Right lower leg: No edema.      Left lower leg: No edema.  Neurological:     Mental Status: He is alert.  Right-sided hemiparesis with weakness in his right leg.  Weakness with knee flexion and extension and ankle flexion and extension.  Ataxia in the right upper arm and right lower leg.         Assessment & Plan:  Need for immunization against influenza - Plan: Flu Vaccine QUAD High Dose(Fluad)  Dyspnea, unspecified type - Plan: ECHOCARDIOGRAM COMPLETE  Orthopnea - Plan: ECHOCARDIOGRAM COMPLETE I will schedule patient for echocardiogram to evaluate further.  Meanwhile increase Lasix to 40 mg daily.  Monitor for signs of dehydration such as weight loss, dry mouth, dizziness.  If present, temporarily decrease diuretic.  Daughter feels comfortable monitoring this.

## 2021-04-12 ENCOUNTER — Telehealth: Payer: Self-pay

## 2021-04-12 NOTE — Telephone Encounter (Signed)
Melissa from [facility] called stating that this pt will need an authorization for an echocardiogram. Please advise.  Cb#: Melissa, [facility] [phone] ext [phone]

## 2021-04-13 ENCOUNTER — Other Ambulatory Visit: Payer: Self-pay

## 2021-04-13 ENCOUNTER — Ambulatory Visit (HOSPITAL_COMMUNITY)
Admission: RE | Admit: 2021-04-13 | Discharge: 2021-04-13 | Disposition: A | Discharge status: Home / Self Care | Payer: Medicare HMO | Source: Ambulatory Visit | Attending: Family Medicine | Admitting: Family Medicine

## 2021-04-13 DIAGNOSIS — E785 Hyperlipidemia, unspecified: Secondary | ICD-10-CM | POA: Insufficient documentation

## 2021-04-13 DIAGNOSIS — R0600 Dyspnea, unspecified: Secondary | ICD-10-CM | POA: Diagnosis not present

## 2021-04-13 DIAGNOSIS — I340 Nonrheumatic mitral (valve) insufficiency: Secondary | ICD-10-CM | POA: Insufficient documentation

## 2021-04-13 DIAGNOSIS — I517 Cardiomegaly: Secondary | ICD-10-CM | POA: Diagnosis not present

## 2021-04-13 DIAGNOSIS — Z951 Presence of aortocoronary bypass graft: Secondary | ICD-10-CM | POA: Insufficient documentation

## 2021-04-13 DIAGNOSIS — I10 Essential (primary) hypertension: Secondary | ICD-10-CM | POA: Insufficient documentation

## 2021-04-13 DIAGNOSIS — R0601 Orthopnea: Secondary | ICD-10-CM

## 2021-04-13 DIAGNOSIS — R0609 Other forms of dyspnea: Secondary | ICD-10-CM

## 2021-04-13 LAB — ECHOCARDIOGRAM COMPLETE
AR max vel: 1.55 cm2
AV Area VTI: 1.88 cm2
AV Area mean vel: 1.6 cm2
AV Mean grad: 6.5 mmHg
AV Peak grad: 12.2 mmHg
Ao pk vel: 1.75 m/s
Area-P 1/2: 4.57 cm2
Calc EF: 41.9 %
S' Lateral: 2.6 cm
Single Plane A2C EF: 41.4 %
Single Plane A4C EF: 41 %

## 2021-04-13 NOTE — Telephone Encounter (Signed)
LVM for Evan [name] to advise per [facility] pt's echo does not need PA

## 2021-04-13 NOTE — Telephone Encounter (Signed)
Melissa from [facility] [phone]. [phone]  She states that this Echo does require a PA and that we would need to call [facility]here at [phone] this is something that [facility] doesn't do.  Evan [name] patient is arrived for appt if you could please call Melissa back once you receive the notification.

## 2021-04-13 NOTE — Telephone Encounter (Signed)
Per Vaughan Basta  Approved  Approval #: [id number]  Dates: 04/13/2021 to 07/12/2021  Tracking #: [id number]  I have left vm for Melissa letting her know that this has been approved and that I have added notes to this phone message.

## 2021-04-15 ENCOUNTER — Telehealth: Payer: Self-pay

## 2021-04-15 ENCOUNTER — Other Ambulatory Visit (HOSPITAL_COMMUNITY): Payer: Self-pay

## 2021-04-15 ENCOUNTER — Ambulatory Visit: Payer: Medicare HMO

## 2021-04-15 MED ORDER — ENTRESTO 24-26 MG PO TABS
1.0000 | ORAL_TABLET | Freq: Two times a day (BID) | ORAL | 1 refills | Status: DC
  Filled 2021-04-15: qty 60, 30d supply, fill #0
  Filled 2021-05-05: qty 60, 30d supply, fill #1

## 2021-04-15 NOTE — Telephone Encounter (Signed)
-----   Message from [name], MD sent at 04/15/2021  6:55 AM EST ----- Echocardiogram shows congestive heart failure.  Ejection fraction has dropped to 45%.  This would likely explain why he is retaining fluid.  To help try to reverse this, would recommend starting Entresto 24/26 p.o. twice daily.  This medication can help manage and improve heart failure.  Also recommend scheduling a follow-up appointment with his cardiologist

## 2021-04-15 NOTE — Telephone Encounter (Signed)
Patient daughter informed of results and recommendations.  Entresto sent to pharmacy.

## 2021-04-21 ENCOUNTER — Other Ambulatory Visit: Payer: Self-pay | Admitting: Family Medicine

## 2021-04-21 ENCOUNTER — Telehealth: Payer: Self-pay

## 2021-04-21 NOTE — Telephone Encounter (Signed)
Pt's daughter called in asking pcp to send a prescription for a lift chair for this pt. Pt's daughter stated that a prescription for a lift chair sent to [facility] will help with pt paying the sales tax. Pt's daughter provided the fax number for [facility] at [phone]. Please advise.  Cb#: [phone]

## 2021-04-22 ENCOUNTER — Telehealth: Payer: Self-pay | Admitting: Pharmacist

## 2021-04-22 NOTE — Telephone Encounter (Signed)
Rx faxed as requested. Hard copy available for pick up, as faxed copy may not be legible.  ATC ph nbr in message (fast busy) and ph nbr in chart (no answer and no vm) multiple times with no success.

## 2021-04-22 NOTE — Progress Notes (Signed)
Chronic Care Management Pharmacy Assistant   Name: Evan [name]  MRN: [id number] DOB: September 22, 1927   Reason for Encounter: Disease State - General Adherence Call     Recent office visits:   04/08/21 [name], MD - Family Medicine - Need for Immunization - ECHO ordered. Flu vaccination administered. Increase Lasix to 40 mg daily. Follow up as scheduled.   03/12/21 [name], MD - Family Medicine - Dyspnea - Labs were ordered. Tamsulosin (FLOMAX) 0.4 MG CAPS capsule prescribed. Follow up as scheduled.   02/01/21 [name], MD - Family Medicine - Depression - No notes available.   Recent consult visits:   04/01/21 Larey Days, MD - Pulmonology - DOE - Chest xray ordered. Tiotropium Bromide-Olodaterol (STIOLTO RESPIMAT) 2.5-2.5 MCG/ACT ordered. Follow up in 3 months.   03/12/21 Alexis Frock - Urgency of urination - No notes available.   03/04/21 Daine Gravel - Urgency of urination - No notes available.   02/12/21 Link Snuffer - Urgency of urination - No notes available.    Hospital visits:  None in previous 6 months  Medications: Outpatient Encounter Medications as of 04/22/2021  Medication Sig Note   acetaminophen (TYLENOL) 325 MG tablet Take 1-2 tablets (325-650 mg total) by mouth every 4 (four) hours as needed for mild pain.    ALPRAZolam (XANAX) 0.5 MG tablet Take 0.5 tablets (0.25 mg total) by mouth 3 (three) times daily as needed for anxiety.    FLUoxetine (PROZAC) 20 MG capsule Take 1 capsule (20 mg total) by mouth daily.    furosemide (LASIX) 20 MG tablet TAKE 1 TABLET DAILY. MAY TAKE AN EXTRA 20 MG TABLET IF INCREASED SHORTNESS OF BREATH OR WEIGHT GAIN.    hydrOXYzine (ATARAX/VISTARIL) 10 MG tablet Take 1 tablet (10 mg total) by mouth at bedtime.    Lifitegrast (XIIDRA) 5 % SOLN Place 1-2 drops into both eyes daily.    magnesium oxide (MAG-OX) 400 (240 Mg) MG tablet Take 1 tablet (400 mg total) by mouth daily.    metoprolol tartrate (LOPRESSOR) 25 MG  tablet TAKE ONE-HALF TABLET (12.5 MG TOTAL) BY MOUTH 2 (TWO) TIMES DAILY. 01/28/2021: Patient only taking once daily   nitroGLYCERIN (NITROSTAT) 0.4 MG SL tablet Place 1 tablet (0.4 mg total) under the tongue every 5 (five) minutes as needed for chest pain.    pantoprazole (PROTONIX) 40 MG tablet TAKE 1 TABLET EVERY DAY    polyethylene glycol (MIRALAX / GLYCOLAX) 17 g packet Take 17 g by mouth daily as needed for mild constipation.    Rivaroxaban (XARELTO) 15 MG TABS tablet Take 1 tablet (15 mg total) by mouth daily with supper.    rosuvastatin (CRESTOR) 5 MG tablet TAKE 1 TABLET EVERY DAY    sacubitril-valsartan (ENTRESTO) 24-26 MG Take 1 tablet by mouth 2 (two) times daily.    tacrolimus (PROTOPIC) 0.1 % ointment Apply 1 application topically 2 (two) times daily.    tamsulosin (FLOMAX) 0.4 MG CAPS capsule Take 1 capsule (0.4 mg total) by mouth daily.    Tiotropium Bromide-Olodaterol (STIOLTO RESPIMAT) 2.5-2.5 MCG/ACT AERS Inhale 2 puffs into the lungs daily.    No facility-administered encounter medications on file as of 04/22/2021.    Have you had any problems recently with your health? Patient denied any problems with his health currently.   Have you had any problems with your pharmacy? Patient denied having any problems with his current pharmacy.   What issues or side effects are you having with your medications? Patient denied any  side effects or issues with his current medications.  What would you like me to pass along to [name], CPP for them to help you with?  Patient did not have anything to pass along to CPP at this time.   What can we do to take care of you better? Patient had no recommendations at this time.   Care Gaps  AWV: unknown  Colonoscopy: done 03/04/04 DM Eye Exam: N/A DM Foot Exam: N/A Microalbumin: N/A HbgAIC: N/A DEXA: N/A Mammogram: N/A   Star Rating Drugs: sacubitril-valsartan (ENTRESTO) 24-26 MG - last filled 04/16/21 30 days  rosuvastatin  (CRESTOR) 5 MG tablet - last filled 04/20/21 90 days   Future Appointments  Date Time Provider [place]  06/30/2021  9:00 AM [name], [name], MD [facility] None  08/12/2021  3:45 PM [facility] PHARMACIST [facility] None    Jobe Gibbon, [place] Clinical Pharmacist Assistant  [phone]

## 2021-04-26 ENCOUNTER — Other Ambulatory Visit: Payer: Self-pay | Admitting: Family Medicine

## 2021-04-26 ENCOUNTER — Telehealth: Payer: Self-pay

## 2021-04-26 ENCOUNTER — Other Ambulatory Visit (HOSPITAL_COMMUNITY): Payer: Self-pay

## 2021-04-26 MED ORDER — MOLNUPIRAVIR EUA 200MG CAPSULE
4.0000 | ORAL_CAPSULE | Freq: Two times a day (BID) | ORAL | 0 refills | Status: AC
  Filled 2021-04-26: qty 40, 5d supply, fill #0

## 2021-04-26 NOTE — Telephone Encounter (Signed)
Spoke with pt's daughter and advised. She states pharmacy has already called them to let them know it's ready for pick up. She does not want to keep appt for tomorrow. Did advise if he develops shob, wheeze, fever/chills or other symptoms she can call back for appt. She voiced understanding. Nothing further needed at this time.

## 2021-04-26 NOTE — Telephone Encounter (Signed)
Pt's daughter has called back in to let office know that pt has tested positive for covid. Pt is scheduled for an ov tomorrow at 9 am with pcp. Pt's daughter would like to know if pt could get meds sent to pharmacy. Please call pts daughter at   Cb#: [phone]

## 2021-04-27 ENCOUNTER — Ambulatory Visit: Payer: Medicare HMO | Admitting: Family Medicine

## 2021-04-29 ENCOUNTER — Telehealth: Payer: Self-pay

## 2021-04-29 NOTE — Telephone Encounter (Signed)
Patients daughter called complaining of shortness of breath and positive covid.  Advised to take patient to [facility] for evaluation.

## 2021-04-30 ENCOUNTER — Telehealth: Payer: Self-pay | Admitting: Pharmacist

## 2021-04-30 NOTE — Progress Notes (Addendum)
Chronic Care Management Pharmacy Assistant   Name: Evan [name]  MRN: [id number] DOB: 1927-10-17   Reason for Encounter: Disease State - General Adherence Call     Recent office visits:  None noted  Recent consult visits:  None noted  Hospital visits:  None in previous 6 months   Medications: Outpatient Encounter Medications as of 04/30/2021  Medication Sig Note   acetaminophen (TYLENOL) 325 MG tablet Take 1-2 tablets (325-650 mg total) by mouth every 4 (four) hours as needed for mild pain.    ALPRAZolam (XANAX) 0.5 MG tablet Take 0.5 tablets (0.25 mg total) by mouth 3 (three) times daily as needed for anxiety.    FLUoxetine (PROZAC) 20 MG capsule Take 1 capsule (20 mg total) by mouth daily.    furosemide (LASIX) 20 MG tablet TAKE 1 TABLET DAILY. MAY TAKE AN EXTRA 20 MG TABLET IF INCREASED SHORTNESS OF BREATH OR WEIGHT GAIN.    hydrOXYzine (ATARAX/VISTARIL) 10 MG tablet Take 1 tablet (10 mg total) by mouth at bedtime.    Lifitegrast (XIIDRA) 5 % SOLN Place 1-2 drops into both eyes daily.    magnesium oxide (MAG-OX) 400 (240 Mg) MG tablet Take 1 tablet (400 mg total) by mouth daily.    metoprolol tartrate (LOPRESSOR) 25 MG tablet TAKE ONE-HALF TABLET (12.5 MG TOTAL) BY MOUTH 2 (TWO) TIMES DAILY. 01/28/2021: Patient only taking once daily   molnupiravir EUA (LAGEVRIO) 200 mg CAPS capsule Take 4 capsules (800 mg total) by mouth 2 (two) times daily for 5 days.    nitroGLYCERIN (NITROSTAT) 0.4 MG SL tablet Place 1 tablet (0.4 mg total) under the tongue every 5 (five) minutes as needed for chest pain.    pantoprazole (PROTONIX) 40 MG tablet TAKE 1 TABLET EVERY DAY    polyethylene glycol (MIRALAX / GLYCOLAX) 17 g packet Take 17 g by mouth daily as needed for mild constipation.    Rivaroxaban (XARELTO) 15 MG TABS tablet Take 1 tablet (15 mg total) by mouth daily with supper.    rosuvastatin (CRESTOR) 5 MG tablet TAKE 1 TABLET EVERY DAY    sacubitril-valsartan (ENTRESTO) 24-26 MG Take 1  tablet by mouth 2 (two) times daily.    tacrolimus (PROTOPIC) 0.1 % ointment Apply 1 application topically 2 (two) times daily.    tamsulosin (FLOMAX) 0.4 MG CAPS capsule Take 1 capsule (0.4 mg total) by mouth daily.    Tiotropium Bromide-Olodaterol (STIOLTO RESPIMAT) 2.5-2.5 MCG/ACT AERS Inhale 2 puffs into the lungs daily.    No facility-administered encounter medications on file as of 04/30/2021.    Have you had any problems recently with your health? Patient reported he is currently positive for COVID. He tested positive on Monday. He is doing ok other than a cough, but it is productive. He is aware if he starts to experience respiratory issues to seek medical attention.   Have you had any problems with your pharmacy? Patient denied any problems with his current pharmacy.   What issues or side effects are you having with your medications? Patient denied any issues or side effects of his current medications.   What would you like me to pass along to [name], CPP for them to help you with?  Patient had nothing to pass along to CPP at this time.   What can we do to take care of you better? Patient had no recommendations at this time.   Care Gaps   AWV: unknown  Colonoscopy: done 03/04/04 DM Eye Exam: N/A DM  Foot Exam: N/A Microalbumin: N/A HbgAIC: N/A DEXA: N/A Mammogram: N/A  Star Rating Drugs: sacubitril-valsartan (ENTRESTO) 24-26 MG - last filled 04/16/21 30 days  rosuvastatin (CRESTOR) 5 MG tablet - last filled 04/20/21 90 days    Future Appointments  Date Time Provider [place]  06/30/2021  9:00 AM [name], [name], MD [facility] None  08/12/2021  3:45 PM [facility] PHARMACIST [facility] None    Jobe Gibbon, [place] Clinical Pharmacist Assistant  [phone]   7 minutes spent in review, coordination, and documentation.  Reviewed by: Beverly Milch, PharmD Clinical Pharmacist [phone]

## 2021-05-05 ENCOUNTER — Telehealth: Payer: Self-pay

## 2021-05-05 ENCOUNTER — Other Ambulatory Visit (HOSPITAL_COMMUNITY): Payer: Self-pay

## 2021-05-05 ENCOUNTER — Telehealth: Payer: Self-pay | Admitting: Family Medicine

## 2021-05-05 ENCOUNTER — Other Ambulatory Visit: Payer: Self-pay

## 2021-05-05 MED ORDER — RIVAROXABAN 15 MG PO TABS: 15.0000 mg | ORAL_TABLET | Freq: Every day | ORAL | 3 refills | Status: DC

## 2021-05-05 MED ORDER — RIVAROXABAN 15 MG PO TABS
15.0000 mg | ORAL_TABLET | Freq: Every day | ORAL | 3 refills | Status: DC
  Filled 2021-05-05: qty 30, 30d supply, fill #0

## 2021-05-05 NOTE — Telephone Encounter (Signed)
Pt's daughter called in just wanted to make sure that pt's prescription of Alveda Reasons) was sent back to [facility] in pharmacy. Please advise..  Cb#: [phone]

## 2021-05-05 NOTE — Addendum Note (Signed)
Addended by: [name] Lessen on: 05/05/2021 03:49 PM   Modules accepted: Orders

## 2021-05-05 NOTE — Telephone Encounter (Addendum)
Rx resent to [facility]. Order for [facility] canceled.   Spoke with pt's daughter and she states pt has been sick for 2 weeks with cough and congestion. Pt is having difficult breathing at night when he lays down to sleep. She is concerned he is not getting better. She reports she tested positive for COVID today. Advised her pt does need to come in for evaluation, we can place them in a room away from other patients. Advised if he gets significantly worse or is having difficulty breathing she will need to call 911.

## 2021-05-05 NOTE — Telephone Encounter (Signed)
Patient's daughter called to report Rx for Rivaroxaban (XARELTO) 15 MG TABS tablet  received by mail order.  [facility] informed patient Rx needs to be rewritten to mail order.  Mail order pharmacy confirmed as  [facility]  [place], [place] [address]  Phone:  [phone]  Fax:  [phone]   Please advise at [phone]

## 2021-05-05 NOTE — Telephone Encounter (Signed)
Error

## 2021-05-06 ENCOUNTER — Ambulatory Visit (INDEPENDENT_AMBULATORY_CARE_PROVIDER_SITE_OTHER): Payer: Medicare HMO | Admitting: Nurse Practitioner

## 2021-05-06 ENCOUNTER — Other Ambulatory Visit: Payer: Self-pay

## 2021-05-06 VITALS — BP 112/70 | HR 68

## 2021-05-06 DIAGNOSIS — R531 Weakness: Secondary | ICD-10-CM | POA: Diagnosis not present

## 2021-05-06 DIAGNOSIS — U071 COVID-19: Secondary | ICD-10-CM

## 2021-05-06 DIAGNOSIS — Z8673 Personal history of transient ischemic attack (TIA), and cerebral infarction without residual deficits: Secondary | ICD-10-CM

## 2021-05-06 DIAGNOSIS — R051 Acute cough: Secondary | ICD-10-CM | POA: Diagnosis not present

## 2021-05-06 DIAGNOSIS — M069 Rheumatoid arthritis, unspecified: Secondary | ICD-10-CM | POA: Diagnosis not present

## 2021-05-06 DIAGNOSIS — J449 Chronic obstructive pulmonary disease, unspecified: Secondary | ICD-10-CM | POA: Diagnosis not present

## 2021-05-06 NOTE — Progress Notes (Signed)
Subjective:    Patient ID: Evan [name], male    DOB: 1927-07-12, 88 y.o.   MRN: [id number]  HPI: Evan [name] is a 88 y.o. male presenting with daughter for cough.  Chief Complaint  Patient presents with   Cough   UPPER RESPIRATORY TRACT INFECTION Onset: 2 weeks ago COVID-19 testing history: COVID positive 1/23 Fever: no Cough: yes; some congestion at times; otherwise, dry Shortness of breath: yes; with activity only Wheezing: occasional per daughter; patient denies Chest pain: no Pleurisy: no Chest tightness: no Chest congestion: yes Nasal congestion: no Runny nose: yes Post nasal drip: no Sneezing: yes Sore throat: no Swollen glands: no Sinus pressure: no Headache: no Face pain: no Toothache: no Ear pain: no  Ear pressure: no  Eyes red/itching:no Eye drainage/crusting: no  Nausea: no  Vomiting: no Diarrhea: no  Change in appetite: patient declines; daughter reports yes; decreased Loss of taste/smell: no  Rash: no Fatigue: yes Sick contacts: no Strep contacts:  no Context: better Recurrent sinusitis: no Treatments attempted: Mucinex Relief with OTC medications: somewhat  Daughter reports that patient's strength has suffered after COVID-19 diagnosis and he is interested in having PT at the home.  Allergies  Allergen Reactions   Feldene [Piroxicam] Other (See Comments)    REACTION: Blisters   Neomycin-Polymyxin-Hc Itching   Statins Other (See Comments)    Myalgias   Doxycycline Other (See Comments)    REACTION:  unknown   Latex Rash   Tequin Anxiety   Valium Other (See Comments)    REACTION:  unknown    Outpatient Encounter Medications as of 05/06/2021  Medication Sig Note   acetaminophen (TYLENOL) 325 MG tablet Take 1-2 tablets (325-650 mg total) by mouth every 4 (four) hours as needed for mild pain.    ALPRAZolam (XANAX) 0.5 MG tablet Take 0.5 tablets (0.25 mg total) by mouth 3 (three) times daily as needed for anxiety.    FLUoxetine (PROZAC)  20 MG capsule Take 1 capsule (20 mg total) by mouth daily.    furosemide (LASIX) 20 MG tablet TAKE 1 TABLET DAILY. MAY TAKE AN EXTRA 20 MG TABLET IF INCREASED SHORTNESS OF BREATH OR WEIGHT GAIN.    hydrOXYzine (ATARAX/VISTARIL) 10 MG tablet Take 1 tablet (10 mg total) by mouth at bedtime.    Lifitegrast (XIIDRA) 5 % SOLN Place 1-2 drops into both eyes daily.    magnesium oxide (MAG-OX) 400 (240 Mg) MG tablet Take 1 tablet (400 mg total) by mouth daily.    metoprolol tartrate (LOPRESSOR) 25 MG tablet TAKE ONE-HALF TABLET (12.5 MG TOTAL) BY MOUTH 2 (TWO) TIMES DAILY. 01/28/2021: Patient only taking once daily   nitroGLYCERIN (NITROSTAT) 0.4 MG SL tablet Place 1 tablet (0.4 mg total) under the tongue every 5 (five) minutes as needed for chest pain.    pantoprazole (PROTONIX) 40 MG tablet TAKE 1 TABLET EVERY DAY    polyethylene glycol (MIRALAX / GLYCOLAX) 17 g packet Take 17 g by mouth daily as needed for mild constipation.    Rivaroxaban (XARELTO) 15 MG TABS tablet Take 1 tablet (15 mg total) by mouth daily with supper.    rosuvastatin (CRESTOR) 5 MG tablet TAKE 1 TABLET EVERY DAY    sacubitril-valsartan (ENTRESTO) 24-26 MG Take 1 tablet by mouth 2 (two) times daily.    tacrolimus (PROTOPIC) 0.1 % ointment Apply 1 application topically 2 (two) times daily.    tamsulosin (FLOMAX) 0.4 MG CAPS capsule Take 1 capsule (0.4 mg total) by mouth daily.  Tiotropium Bromide-Olodaterol (STIOLTO RESPIMAT) 2.5-2.5 MCG/ACT AERS Inhale 2 puffs into the lungs daily.    No facility-administered encounter medications on file as of 05/06/2021.    Patient Active Problem List   Diagnosis Date Noted   Rib pain on right side 03/19/2020   Sundowning 07/04/2019   Bilateral hearing loss 04/16/2019   Urinary frequency 03/21/2019   Closed wedge fracture of lumbar vertebra ([place]) 03/14/2019   Chronic right shoulder pain 12/19/2018   Weakness generalized 09/28/2018   Palliative care encounter 09/28/2018   Pressure injury  of skin 08/27/2018   COPD (chronic obstructive pulmonary disease) ([place]) 08/26/2018   AMS (altered mental status) 08/22/2018   Altered mental status 08/21/2018   UTI (urinary tract infection) 08/21/2018   Abnormality of gait 08/20/2018   Prolonged Q-T interval on ECG    Dysphagia, post-stroke    Supplemental oxygen dependent    Anxiety state    Fatigue    SOB (shortness of breath)    Hyperglycemia    History of lung cancer    Depression    CKD (chronic kidney disease), stage III ([place])    Acute blood loss anemia    Fall 07/16/2018   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   Acute spontaneous 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 ([place]) L thalamic 07/14/2018   Cellulitis of arm, right 06/01/2018   Rash and nonspecific skin eruption 06/01/2018   Chest pain 02/02/2018   Thrombocytopenia ([place]) 02/02/2018   Hypokalemia 02/02/2018   Pulmonary embolism ([place]) 02/02/2018   Elevated troponin 02/02/2018   Pulmonary embolus ([place]) 02/02/2018   Non-ST elevation MI (NSTEMI) ([place]) 21/19/4174   Diastolic CHF ([place]) 11/15/4816   Chest pain due to CAD ([place]) 06/26/2017   Dyspnea 06/21/2017   Acute bronchitis 05/15/2017   Oral candidiasis 05/15/2017   Acute pericarditis    Chest pain at rest 12/20/2016   Angina pectoris ([place]) 12/20/2016   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   Pleurisy 10/11/2016   Chest wall pain 10/11/2016   Subdural hematoma 09/13/2016   Atrial fibrillation ([place]) 05/21/2016   Adenocarcinoma of right lung ([place]) 01/06/2016   GAD (generalized anxiety disorder) 11/20/2015   Essential hypertension    Cellulitis 09/04/2015   Cellulitis of left axilla    Esophageal ulcer without bleeding    Bilateral lower extremity edema 04/28/2014   BPH (benign prostatic hyperplasia) 10/08/2012   Anxiety    EKG abnormalities 09/05/2012   Tremor 09/05/2012   Chronic fatigue 09/05/2012   Arthritis    Asthma, chronic     Reflux esophagitis    S/P CABG (coronary artery bypass graft)    GERD (gastroesophageal reflux disease)    Hyperlipidemia    Hypertension    UNSPECIFIED PERIPHERAL VASCULAR DISEASE 06/16/2009    Past Medical History:  Diagnosis Date   Adenocarcinoma of right lung ([place]) 01/06/2016   Anginal chest pain at rest [facility])    Chronic non, controlled on Lorazepam   Anxiety    Arthritis    "all over"    BPH (benign prostatic hyperplasia)    CAD (coronary artery disease)    Chronic lower back pain    Complication of anesthesia    "problems making water afterwards"   Depression    DJD (degenerative joint disease)    Esophageal ulcer without bleeding    bid ppi indefinitely   Family history of adverse reaction to anesthesia    "children w/PONV"   GERD (gastroesophageal reflux disease)    Headache    "  couple/week maybe" (09/04/2015)   Hemochromatosis    Possible, elevated iron stores, hook-like osteophytes on hand films, normal LFTs   Hepatitis    "yellow jaundice as a baby"   History of ASCVD    MULTIVESSEL   Hyperlipidemia    Hypertension    Osteoarthritis    Pneumonia    "several times; got a little now" (09/04/2015)   Rheumatoid arthritis ([place])    Subdural hematoma    small after fall 09/2016-plavix held, neurosurgery consulted.  Asymptomatic.     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[place]) 02/2018   on Lovenox lifelong since he failed PO Eliquis    Relevant past medical, surgical, family and social history reviewed and updated as indicated. Interim medical history since our last visit reviewed.  Review of Systems Per HPI unless specifically indicated above     Objective:    BP 112/70    Pulse 68    SpO2 95%   Wt Readings from Last 3 Encounters:  04/01/21 162 lb 3.2 oz (73.6 kg)  03/11/21 165 lb (74.8 kg)  11/10/20 162 lb (73.5 kg)    Physical Exam Vitals and nursing note reviewed.  Constitutional:      General: He is not in acute distress.    Appearance: Normal appearance. He is not  toxic-appearing.  HENT:     Head: Normocephalic and atraumatic.     Right Ear: Tympanic membrane, ear canal and external ear normal.     Left Ear: Tympanic membrane, ear canal and external ear normal. There is impacted cerumen.     Nose: Nose normal. No congestion or rhinorrhea.     Right Turbinates: Not enlarged, swollen or pale.     Left Turbinates: Not enlarged, swollen or pale.     Right Sinus: No maxillary sinus tenderness or frontal sinus tenderness.     Left Sinus: No maxillary sinus tenderness or frontal sinus tenderness.     Mouth/Throat:     Mouth: Mucous membranes are moist.     Pharynx: Oropharynx is clear.  Eyes:     General: No scleral icterus.       Right eye: No discharge.        Left eye: No discharge.     Extraocular Movements: Extraocular movements intact.  Cardiovascular:     Rate and Rhythm: Normal rate and regular rhythm.     Heart sounds: Normal heart sounds. No murmur heard. Pulmonary:     Effort: Pulmonary effort is normal. No respiratory distress.     Breath sounds: Examination of the right-lower field reveals rales. Rales present. No wheezing or rhonchi.  Musculoskeletal:     Cervical back: Normal range of motion.  Lymphadenopathy:     Cervical: No cervical adenopathy.  Skin:    General: Skin is warm and dry.     Capillary Refill: Capillary refill takes less than 2 seconds.     Coloration: Skin is not jaundiced or pale.     Findings: No erythema.  Neurological:     Mental Status: He is alert. Mental status is at baseline.     Gait: Gait abnormal (sitting in wheelchair).      Assessment & Plan:  1. Acute cough Acute.  Lung sounds clear today with good air movement with exception of crackles to right lower lobe; this appears to be a somewhat chronic finding for patient.  No signs or symptoms of bacterial infection today.  Patient is non-toxic appearing, interactive, talking in complete sentences, no acute distress, no pain with inspiration.  Encouraged  use of cough suppressant for dry cough; dry cough can last for a few weeks after acute respiratory infection.  Can also try nasal steroid to help prevent post nasal drip and dry cough.  Follow up if symptoms worsen or do not improve.  Start home health PT for weakness since COVID-19 diagnosis on 04/26/21.  Also has a history of stroke and would benefit from formal PT for strength training.  2. COVID-19  - Ambulatory referral to [facility]  3. Weakness  - Ambulatory referral to Home Health  4. History of stroke  - Ambulatory referral to Home Health    Follow up plan: Return if symptoms worsen or fail to improve.

## 2021-05-07 ENCOUNTER — Ambulatory Visit (INDEPENDENT_AMBULATORY_CARE_PROVIDER_SITE_OTHER): Payer: Medicare HMO

## 2021-05-07 VITALS — Ht 63.0 in | Wt 162.0 lb

## 2021-05-07 DIAGNOSIS — Z0000 Encounter for general adult medical examination without abnormal findings: Secondary | ICD-10-CM

## 2021-05-07 NOTE — Progress Notes (Signed)
Subjective:   Evan [name] is a 88 y.o. male who presents for an Initial Medicare Annual Wellness Visit. Virtual Visit via Telephone Note  I connected with  Evan [name] on 05/07/21 at 12:00 PM EST by telephone and verified that I am speaking with the correct person using two identifiers.  Location: Patient: HOME Provider: [facility] Persons participating in the virtual visit: patient/Nurse Health Advisor   I discussed the limitations, risks, security and privacy concerns of performing an evaluation and management service by telephone and the availability of in person appointments. The patient expressed understanding and agreed to proceed.  Interactive audio and video telecommunications were attempted between this nurse and patient, however failed, due to patient having technical difficulties OR patient did not have access to video capability.  We continued and completed visit with audio only.  Some vital signs may be absent or patient reported.   Chriss Driver, LPN  Review of Systems     Cardiac Risk Factors include: advanced age (>51men, >69 women);dyslipidemia;male gender;sedentary lifestyle;Other (see comment), Risk factor comments: COPD, CKD, AFIB  PHONE VISIT. PT AT HOME. NURSE AT [facility].    Objective:    Today's Vitals   05/07/21 1154  Weight: 162 lb (73.5 kg)  Height: 5\' 3"  (1.6 m)   Body mass index is 28.7 kg/m.  Advanced Directives 05/07/2021 08/25/2020 08/25/2020 07/30/2019 06/06/2019 03/09/2019 11/05/2018  Does Patient Have a Medical Advance Directive? Yes Yes Yes No No No No  Type of Paramedic of [place];Living will [place];Living will [place];Living will - - - -  Does patient want to make changes to medical advance directive? - - - - - - -  Copy of [place] in Chart? Yes - validated most recent copy scanned in chart (See row information) No - copy requested No - copy requested - - - -   Would patient like information on creating a medical advance directive? - - - No - Patient declined No - Guardian declined - No - Patient declined    Current Medications (verified) Outpatient Encounter Medications as of 05/07/2021  Medication Sig   acetaminophen (TYLENOL) 325 MG tablet Take 1-2 tablets (325-650 mg total) by mouth every 4 (four) hours as needed for mild pain.   ALPRAZolam (XANAX) 0.5 MG tablet Take 0.5 tablets (0.25 mg total) by mouth 3 (three) times daily as needed for anxiety.   FLUoxetine (PROZAC) 20 MG capsule Take 1 capsule (20 mg total) by mouth daily.   furosemide (LASIX) 20 MG tablet TAKE 1 TABLET DAILY. MAY TAKE AN EXTRA 20 MG TABLET IF INCREASED SHORTNESS OF BREATH OR WEIGHT GAIN.   hydrOXYzine (ATARAX/VISTARIL) 10 MG tablet Take 1 tablet (10 mg total) by mouth at bedtime.   Lifitegrast (XIIDRA) 5 % SOLN Place 1-2 drops into both eyes daily.   magnesium oxide (MAG-OX) 400 (240 Mg) MG tablet Take 1 tablet (400 mg total) by mouth daily.   metoprolol tartrate (LOPRESSOR) 25 MG tablet TAKE ONE-HALF TABLET (12.5 MG TOTAL) BY MOUTH 2 (TWO) TIMES DAILY.   nitroGLYCERIN (NITROSTAT) 0.4 MG SL tablet Place 1 tablet (0.4 mg total) under the tongue every 5 (five) minutes as needed for chest pain.   pantoprazole (PROTONIX) 40 MG tablet TAKE 1 TABLET EVERY DAY   polyethylene glycol (MIRALAX / GLYCOLAX) 17 g packet Take 17 g by mouth daily as needed for mild constipation.   Rivaroxaban (XARELTO) 15 MG TABS tablet Take  1 tablet (15 mg total) by mouth daily with supper.   rosuvastatin (CRESTOR) 5 MG tablet TAKE 1 TABLET EVERY DAY   sacubitril-valsartan (ENTRESTO) 24-26 MG Take 1 tablet by mouth 2 (two) times daily.   tacrolimus (PROTOPIC) 0.1 % ointment Apply 1 application topically 2 (two) times daily.   tamsulosin (FLOMAX) 0.4 MG CAPS capsule Take 1 capsule (0.4 mg total) by mouth daily.   Tiotropium Bromide-Olodaterol (STIOLTO RESPIMAT) 2.5-2.5 MCG/ACT AERS Inhale 2 puffs into the  lungs daily.   No facility-administered encounter medications on file as of 05/07/2021.    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   Anginal chest pain at [facility])    Chronic non, controlled on Lorazepam   Anxiety    Arthritis    "all over"    BPH (benign prostatic hyperplasia)    CAD (coronary artery disease)    Chronic lower back pain    Complication of anesthesia    "problems making water afterwards"   Depression    DJD (degenerative joint disease)    Esophageal ulcer without bleeding    bid ppi indefinitely   Family history of adverse reaction to anesthesia    "children w/PONV"   GERD (gastroesophageal reflux disease)    Headache    "couple/week maybe" (09/04/2015)   Hemochromatosis    Possible, elevated iron stores, hook-like osteophytes on hand films, normal LFTs   Hepatitis    "yellow jaundice as a baby"   History of ASCVD    MULTIVESSEL   Hyperlipidemia    Hypertension    Osteoarthritis    Pneumonia    "several times; got a little now" (09/04/2015)   Rheumatoid arthritis ([place])    Subdural hematoma    small after fall 09/2016-plavix held, neurosurgery consulted.  Asymptomatic.     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   Past Surgical History:  Procedure Laterality Date   ARM SKIN LESION BIOPSY / EXCISION Left 09/02/2015   CATARACT EXTRACTION W/ INTRAOCULAR LENS  IMPLANT, BILATERAL Bilateral    CHOLECYSTECTOMY OPEN     CORNEAL TRANSPLANT Bilateral    "one at [facility]; one at [facility]"   [place] W/ STENT     DE stent ostium into the right radial free graft at OM-, 02-2007   ESOPHAGOGASTRODUODENOSCOPY N/A 11/09/2014   Procedure: ESOPHAGOGASTRODUODENOSCOPY  (EGD);  Surgeon: [name], MD;  Location: [facility] ENDOSCOPY;  Service: Endoscopy;  Laterality: N/A;   INGUINAL HERNIA REPAIR     JOINT REPLACEMENT     LEFT HEART CATH AND CORS/GRAFTS ANGIOGRAPHY N/A 06/27/2017   Procedure: LEFT HEART CATH AND CORS/GRAFTS ANGIOGRAPHY;  Surgeon: [name], MD;  Location: [place] CV LAB;  Service: Cardiovascular;  Laterality: N/A;   NASAL SINUS SURGERY     SHOULDER OPEN ROTATOR CUFF REPAIR Right    TOTAL KNEE ARTHROPLASTY Bilateral    Family History  Problem Relation Age of Onset   Arthritis-Osteo Sister    Heart attack Brother    Heart attack Other    Cancer Neg Hx    Social History   Socioeconomic History   Marital status: Widowed    Spouse name: Not on file   Number of children: Not on file   Years  of education: Not on file   Highest education level: Not on file  Occupational History   Not on file  Tobacco Use   Smoking status: Never    Passive exposure: Yes   Smokeless tobacco: Former    Types: Chew   Tobacco comments:    "quit chewing in the 1960s"  Vaping Use   Vaping Use: Never used  Substance and Sexual Activity   Alcohol use: No   Drug use: No   Sexual activity: Never  Other Topics Concern   Not on file  Social History Narrative   Not on file   Social Determinants of Health   Financial Resource Strain: Low Risk    Difficulty of Paying Living Expenses: Not hard at all  Food Insecurity: No Food Insecurity   Worried About Charity fundraiser in the Last Year: Never true   Ran Out of Food in the Last Year: Never true  Transportation Needs: No Transportation Needs   Lack of Transportation (Medical): No   Lack of Transportation (Non-Medical): No  Physical Activity: Insufficiently Active   Days of Exercise per Week: 3 days   Minutes of Exercise per Session: 20 min  Stress: No Stress Concern Present   Feeling of Stress : Not at all  Social Connections: Socially Isolated   Frequency of Communication with Friends and  Family: More than three times a week   Frequency of Social Gatherings with Friends and Family: More than three times a week   Attends Religious Services: Never   Marine scientist or Organizations: No   Attends Archivist Meetings: Never   Marital Status: Widowed    Tobacco Counseling Counseling given: Not Answered Tobacco comments: "quit chewing in the 1960s"   Clinical Intake:  Pre-visit preparation completed: Yes  Pain : No/denies pain     BMI - recorded: 28.7 Nutritional Status: BMI 25 -29 Overweight Nutritional Risks: None Diabetes: No  How often do you need to have someone help you when you read instructions, pamphlets, or other written materials from your doctor or pharmacy?: 1 - Never  Diabetic?NO  Interpreter Needed?: No  Information entered by :: mj Vinetta Brach, lpn   Activities of Daily Living In your present state of health, do you have any difficulty performing the following activities: 05/07/2021  Hearing? N  Vision? N  Difficulty concentrating or making decisions? Y  Walking or climbing stairs? Y  Dressing or bathing? Y  Doing errands, shopping? Y  Preparing Food and eating ? Y  Using the Toilet? Y  In the past six months, have you accidently leaked urine? Y  Do you have problems with loss of bowel control? N  Managing your Medications? Y  Managing your Finances? Y  Housekeeping or managing your Housekeeping? Y  Some recent data might be hidden    Patient Care Team: [name], MD as PCP - General (Family Medicine) Jettie Booze, MD as PCP - Cardiology (Cardiology) Edythe Clarity, [facility] as Pharmacist (Pharmacist)  Indicate any recent Medical Services you may have received from other than [facility] providers in the past year (date may be approximate).     Assessment:   This is a routine wellness examination for Evan [name].  Hearing/Vision screen Hearing Screening - Comments:: Some hearing issues.  Vision Screening -  Comments:: Glasses. Dr. Mallie Mussel. 2022.  Dietary issues and exercise activities discussed: Current Exercise Habits: Home exercise routine, Type of exercise: stretching (Chair exercises.), Time (Minutes): 20, Frequency (Times/Week): 3,  Weekly Exercise (Minutes/Week): 60, Intensity: Mild, Exercise limited by: cardiac condition(s);respiratory conditions(s)   Goals Addressed             This Visit's Progress    Have 3 meals a day       Eat a healthy diet and stay hydrated.        Depression Screen PHQ 2/9 Scores 05/07/2021 07/06/2020 07/04/2019 08/20/2018 10/26/2017 08/23/2017 05/04/2017  PHQ - 2 Score 0 0 2 2 0 0 0  PHQ- 9 Score - - - 8 - - -    Fall Risk Fall Risk  05/07/2021 07/06/2020 03/19/2020 03/19/2020 07/04/2019  Falls in the past year? 0 0 0 0 1  Number falls in past yr: 0 0 0 0 1  Comment - - - - last 03/2019 compr fx of spine --tx in ED  Injury with Fall? 0 0 0 0 1  Comment - - - - -  Risk for fall due to : Impaired balance/gait;Impaired mobility - Mental status change Impaired mobility History of fall(s);Impaired balance/gait;Medication side effect;Impaired mobility  Follow up Falls prevention discussed - - - -    FALL RISK PREVENTION PERTAINING TO THE HOME:  Any stairs in or around the home? Yes  If so, are there any without handrails? No  Home free of loose throw rugs in walkways, pet beds, electrical cords, etc? Yes  Adequate lighting in your home to reduce risk of falls? Yes   ASSISTIVE DEVICES UTILIZED TO PREVENT FALLS:  Life alert? No  Use of a cane, walker or w/c? Yes  Grab bars in the bathroom? Yes  Shower chair or bench in shower? Yes  Elevated toilet seat or a handicapped toilet? Yes   TIMED UP AND GO:  Was the test performed? No .  Phone visit.  Cognitive Function:     6CIT Screen 05/07/2021  What Year? 4 points  What month? 0 points  What time? 0 points  Count back from 20 0 points  Months in reverse 4 points  Repeat phrase 6 points  Total Score 14     Immunizations Immunization History  Administered Date(s) Administered   Fluad Quad(high Dose 65+) 01/04/2019, 01/23/2020, 04/08/2021   Influenza Whole 01/26/2007, 01/10/2008, 12/30/2009   Influenza, High Dose Seasonal PF 12/23/2016, 02/09/2018   Influenza,inj,Quad PF,6+ Mos 12/18/2012, 01/16/2015, 12/29/2015   PFIZER(Purple Top)SARS-COV-2 Vaccination 05/10/2019, 06/04/2019, 02/05/2020   Pneumococcal Conjugate-13 06/24/2014   Pneumococcal Polysaccharide-23 01/25/2000   Td 12/27/2000, 03/09/2010   Tdap 03/09/2010    TDAP status: Due, Education has been provided regarding the importance of this vaccine. Advised may receive this vaccine at local pharmacy or Health Dept. Aware to provide a copy of the vaccination record if obtained from local pharmacy or Health Dept. Verbalized acceptance and understanding.  Flu Vaccine status: Up to date  Pneumococcal vaccine status: Up to date  Covid-19 vaccine status: Completed vaccines  Qualifies for Shingles Vaccine? Yes   Zostavax completed No   Shingrix Completed?: No.    Education has been provided regarding the importance of this vaccine. Patient has been advised to call insurance company to determine out of pocket expense if they have not yet received this vaccine. Advised may also receive vaccine at local pharmacy or Health Dept. Verbalized acceptance and understanding.  Screening Tests Health Maintenance  Topic Date Due   Zoster Vaccines- Shingrix (1 of 2) Never done   TETANUS/TDAP  03/09/2020   COVID-19 Vaccine (4 - Booster for Pfizer series) 04/01/2020   Pneumonia Vaccine  30+ Years old  Completed   INFLUENZA VACCINE  Completed   [facility] Maintenance Due  Topic Date Due   Zoster Vaccines- Shingrix (1 of 2) Never done   TETANUS/TDAP  03/09/2020   COVID-19 Vaccine (4 - Booster for Pfizer series) 04/01/2020    Colorectal cancer screening: No longer required.   Lung Cancer  Screening: (Low Dose CT Chest recommended if Age 49-80 years, 30 pack-year currently smoking OR have quit w/in 15years.) does not qualify.    Additional Screening:  Hepatitis C Screening: does not qualify.  Vision Screening: Recommended annual ophthalmology exams for early detection of glaucoma and other disorders of the eye. Is the patient up to date with their annual eye exam?  Yes  Who is the provider or what is the name of the office in which the patient attends annual eye exams? Dr. Mallie Mussel  If pt is not established with a provider, would they like to be referred to a provider to establish care? No .   Dental Screening: Recommended annual dental exams for proper oral hygiene  Community Resource Referral / Chronic Care Management: CRR required this visit?  No   CCM required this visit?  No      Plan:     I have personally reviewed and noted the following in the patients chart:   Medical and social history Use of alcohol, tobacco or illicit drugs  Current medications and supplements including opioid prescriptions. Patient is not currently taking opioid prescriptions. Functional ability and status Nutritional status Physical activity Advanced directives List of other physicians Hospitalizations, surgeries, and ER visits in previous 12 months Vitals Screenings to include cognitive, depression, and falls Referrals and appointments  In addition, I have reviewed and discussed with patient certain preventive protocols, quality metrics, and best practice recommendations. A written personalized care plan for preventive services as well as general preventive health recommendations were provided to patient.     Chriss Driver, LPN   05/11/348   Nurse Notes: Visit completed with pt and pt's daughter Evan [name]. Pt is up to date on all age appropriate health maintenance and vaccines. Discussed Shingrix and how to obtain.

## 2021-05-07 NOTE — Patient Instructions (Signed)
Evan [name] , Thank you for taking time to come for your Medicare Wellness Visit. I appreciate your ongoing commitment to your health goals. Please review the following plan we discussed and let me know if I can assist you in the future.   Screening recommendations/referrals: Colonoscopy: Done 03/04/2004. No longer required due to age.   Recommended yearly ophthalmology/optometry visit for glaucoma screening and checkup Recommended yearly dental visit for hygiene and checkup  Vaccinations: Influenza vaccine: Done 04/08/2021. Repeat annually  Pneumococcal vaccine: Done 01/25/2000 and 06/24/2014 Tdap vaccine: Done 03/09/2010 Repeat in 10 years  Shingles vaccine: Discussed. Contact your local pharmacy for administration of vaccine.   Covid-19: Done 05/10/2019, 06/04/2019 and 02/05/2020.  Advanced directives: Copy of advanced directives in patient's chart.  Conditions/risks identified: Aim for 30 minutes of exercise each day, drink 6-8 glasses of water and eat lots of fruits and vegetables.   Next appointment: Follow up in one year for your annual wellness visit. 2024.  Preventive Care 86 Years and Older, Male  Preventive care refers to lifestyle choices and visits with your health care provider that can promote health and wellness. What does preventive care include? A yearly physical exam. This is also called an annual well check. Dental exams once or twice a year. Routine eye exams. Ask your health care provider how often you should have your eyes checked. Personal lifestyle choices, including: Daily care of your teeth and gums. Regular physical activity. Eating a healthy diet. Avoiding tobacco and drug use. Limiting alcohol use. Practicing safe sex. Taking low doses of aspirin every day. Taking vitamin and mineral supplements as recommended by your health care provider. What happens during an annual well check? The services and screenings done by your health care provider during your  annual well check will depend on your age, overall health, lifestyle risk factors, and family history of disease. Counseling  Your health care provider may ask you questions about your: Alcohol use. Tobacco use. Drug use. Emotional well-being. Home and relationship well-being. Sexual activity. Eating habits. History of falls. Memory and ability to understand (cognition). Work and work Statistician. Screening  You may have the following tests or measurements: Height, weight, and BMI. Blood pressure. Lipid and cholesterol levels. These may be checked every 5 years, or more frequently if you are over 28 years old. Skin check. Lung cancer screening. You may have this screening every year starting at age 86 if you have a 30-pack-year history of smoking and currently smoke or have quit within the past 15 years. Fecal occult blood test (FOBT) of the stool. You may have this test every year starting at age 86. Flexible sigmoidoscopy or colonoscopy. You may have a sigmoidoscopy every 5 years or a colonoscopy every 10 years starting at age 86. Prostate cancer screening. Recommendations will vary depending on your family history and other risks. Hepatitis C blood test. Hepatitis B blood test. Sexually transmitted disease (STD) testing. Diabetes screening. This is done by checking your blood sugar (glucose) after you have not eaten for a while (fasting). You may have this done every 86-3 years. Abdominal aortic aneurysm (AAA) screening. You may need this if you are a current or former smoker. Osteoporosis. You may be screened starting at age 86 if you are at high risk. Talk with your health care provider about your test results, treatment options, and if necessary, the need for more tests. Vaccines  Your health care provider may recommend certain vaccines, such as: Influenza vaccine. This is recommended every year.  Tetanus, diphtheria, and acellular pertussis (Tdap, Td) vaccine. You may need a Td  booster every 10 years. Zoster vaccine. You may need this after age 86. Pneumococcal 13-valent conjugate (PCV13) vaccine. One dose is recommended after age 86. Pneumococcal polysaccharide (PPSV23) vaccine. One dose is recommended after age 86. Talk to your health care provider about which screenings and vaccines you need and how often you need them. This information is not intended to replace advice given to you by your health care provider. Make sure you discuss any questions you have with your health care provider. Document Released: 04/17/2015 Document Revised: 12/09/2015 Document Reviewed: 01/20/2015 Elsevier Interactive Patient Education  2017 [place] Prevention in the Home Falls can cause injuries. They can happen to people of all ages. There are many things you can do to make your home safe and to help prevent falls. What can I do on the outside of my home? Regularly fix the edges of walkways and driveways and fix any cracks. Remove anything that might make you trip as you walk through a door, such as a raised step or threshold. Trim any bushes or trees on the path to your home. Use bright outdoor lighting. Clear any walking paths of anything that might make someone trip, such as rocks or tools. Regularly check to see if handrails are loose or broken. Make sure that both sides of any steps have handrails. Any raised decks and porches should have guardrails on the edges. Have any leaves, snow, or ice cleared regularly. Use sand or salt on walking paths during winter. Clean up any spills in your garage right away. This includes oil or grease spills. What can I do in the bathroom? Use night lights. Install grab bars by the toilet and in the tub and shower. Do not use towel bars as grab bars. Use non-skid mats or decals in the tub or shower. If you need to sit down in the shower, use a plastic, non-slip stool. Keep the floor dry. Clean up any water that spills on the floor  as soon as it happens. Remove soap buildup in the tub or shower regularly. Attach bath mats securely with double-sided non-slip rug tape. Do not have throw rugs and other things on the floor that can make you trip. What can I do in the bedroom? Use night lights. Make sure that you have a light by your bed that is easy to reach. Do not use any sheets or blankets that are too big for your bed. They should not hang down onto the floor. Have a firm chair that has side arms. You can use this for support while you get dressed. Do not have throw rugs and other things on the floor that can make you trip. What can I do in the kitchen? Clean up any spills right away. Avoid walking on wet floors. Keep items that you use a lot in easy-to-reach places. If you need to reach something above you, use a strong step stool that has a grab bar. Keep electrical cords out of the way. Do not use floor polish or wax that makes floors slippery. If you must use wax, use non-skid floor wax. Do not have throw rugs and other things on the floor that can make you trip. What can I do with my stairs? Do not leave any items on the stairs. Make sure that there are handrails on both sides of the stairs and use them. Fix handrails that are broken or loose.  Make sure that handrails are as long as the stairways. Check any carpeting to make sure that it is firmly attached to the stairs. Fix any carpet that is loose or worn. Avoid having throw rugs at the top or bottom of the stairs. If you do have throw rugs, attach them to the floor with carpet tape. Make sure that you have a light switch at the top of the stairs and the bottom of the stairs. If you do not have them, ask someone to add them for you. What else can I do to help prevent falls? Wear shoes that: Do not have high heels. Have rubber bottoms. Are comfortable and fit you well. Are closed at the toe. Do not wear sandals. If you use a stepladder: Make sure that it is  fully opened. Do not climb a closed stepladder. Make sure that both sides of the stepladder are locked into place. Ask someone to hold it for you, if possible. Clearly mark and make sure that you can see: Any grab bars or handrails. First and last steps. Where the edge of each step is. Use tools that help you move around (mobility aids) if they are needed. These include: Canes. Walkers. Scooters. Crutches. Turn on the lights when you go into a dark area. Replace any light bulbs as soon as they burn out. Set up your furniture so you have a clear path. Avoid moving your furniture around. If any of your floors are uneven, fix them. If there are any pets around you, be aware of where they are. Review your medicines with your doctor. Some medicines can make you feel dizzy. This can increase your chance of falling. Ask your doctor what other things that you can do to help prevent falls. This information is not intended to replace advice given to you by your health care provider. Make sure you discuss any questions you have with your health care provider. Document Released: 01/15/2009 Document Revised: 08/27/2015 Document Reviewed: 04/25/2014 Elsevier Interactive Patient Education  2017 [employer].

## 2021-05-10 ENCOUNTER — Other Ambulatory Visit (HOSPITAL_COMMUNITY): Payer: Self-pay

## 2021-05-12 ENCOUNTER — Other Ambulatory Visit (HOSPITAL_COMMUNITY): Payer: Self-pay

## 2021-05-14 DIAGNOSIS — R3915 Urgency of urination: Secondary | ICD-10-CM | POA: Diagnosis not present

## 2021-05-17 ENCOUNTER — Telehealth: Payer: Self-pay

## 2021-05-17 ENCOUNTER — Other Ambulatory Visit: Payer: Self-pay | Admitting: Family Medicine

## 2021-05-17 ENCOUNTER — Other Ambulatory Visit (HOSPITAL_COMMUNITY): Payer: Self-pay

## 2021-05-17 MED ORDER — HYDROCODONE BIT-HOMATROP MBR 5-1.5 MG/5ML PO SOLN
5.0000 mL | Freq: Three times a day (TID) | ORAL | 0 refills | Status: DC | PRN
Start: 2021-05-17 — End: 2021-07-28
  Filled 2021-05-17: qty 120, 8d supply, fill #0

## 2021-05-17 NOTE — Telephone Encounter (Signed)
Pt daughter aware on VM

## 2021-05-17 NOTE — Telephone Encounter (Signed)
Pt's daughter called in stating that pt is recovering from covid and was seen in office recently. Pt's daughter states that pt is still having a really bad cough and would like to know if pt could get a cough med sent to pharmacy. Please advise.   Cb#: [phone]

## 2021-05-19 ENCOUNTER — Other Ambulatory Visit (HOSPITAL_COMMUNITY): Payer: Self-pay

## 2021-05-19 NOTE — Telephone Encounter (Signed)
Received call from patient's daughter [name] to request call back; concerned about whether or not patient should start aking HYDROcodone bit-homatropine (HYCODAN) 5-1.5 MG/5ML syrup [[id number]]   Afraid to administer medication since patient is extremely sensitive to meds that cause drowsiness; wants to know if dose can be modified.   Please advise at [phone].

## 2021-05-20 NOTE — Telephone Encounter (Signed)
Spoke with patient's daughter (DPR) and advised she can give him half the dose to see how he does, if he is ok with this and she feels he needs full dose she can increase. Monitor his reaction and let us know if she needs any further advice. Nothing further needed at this time.

## 2021-05-28 ENCOUNTER — Telehealth: Payer: Self-pay | Admitting: Pharmacist

## 2021-05-28 ENCOUNTER — Telehealth: Payer: Self-pay | Admitting: Family Medicine

## 2021-05-28 NOTE — Telephone Encounter (Signed)
Received call from patient's daughter [name]. Called to follow up on patient's cough.  States patient experiencing sob; wants to know if medication HYDROcodone bit-homatropine (HYCODAN) 5-1.5 MG/5ML syrup [[id number]] is causing it. States patient can't take a lot of strong medication.  Patient currently using disposable undergarments; has developed a red rash (skin not broken). Treating with miconazole. Wants to know if patient should be using something else.   Please advise at [phone].

## 2021-05-28 NOTE — Progress Notes (Signed)
Chronic Care Management Pharmacy Assistant   Name: WOODWARD KLEM  MRN: [id number] DOB: 24-Aug-1927   Reason for Encounter: Disease State - General Adherence Call     Recent office visits:  05/07/21 Medicare Wellness Completed  05/06/21 Noemi Chapel, NP - Referral to Home Health ordered Follow up as needed.    Recent consult visits:  None noted.   Hospital visits:  None in previous 6 months  Medications: Outpatient Encounter Medications as of 05/28/2021  Medication Sig Note   acetaminophen (TYLENOL) 325 MG tablet Take 1-2 tablets (325-650 mg total) by mouth every 4 (four) hours as needed for mild pain.    ALPRAZolam (XANAX) 0.5 MG tablet Take 0.5 tablets (0.25 mg total) by mouth 3 (three) times daily as needed for anxiety.    FLUoxetine (PROZAC) 20 MG capsule Take 1 capsule (20 mg total) by mouth daily.    furosemide (LASIX) 20 MG tablet TAKE 1 TABLET DAILY. MAY TAKE AN EXTRA 20 MG TABLET IF INCREASED SHORTNESS OF BREATH OR WEIGHT GAIN.    HYDROcodone bit-homatropine (HYCODAN) 5-1.5 MG/5ML syrup Take 5 mLs by mouth every 8 (eight) hours as needed for cough.    hydrOXYzine (ATARAX/VISTARIL) 10 MG tablet Take 1 tablet (10 mg total) by mouth at bedtime.    Lifitegrast (XIIDRA) 5 % SOLN Place 1-2 drops into both eyes daily.    magnesium oxide (MAG-OX) 400 (240 Mg) MG tablet Take 1 tablet (400 mg total) by mouth daily.    metoprolol tartrate (LOPRESSOR) 25 MG tablet TAKE ONE-HALF TABLET (12.5 MG TOTAL) BY MOUTH 2 (TWO) TIMES DAILY. 01/28/2021: Patient only taking once daily   nitroGLYCERIN (NITROSTAT) 0.4 MG SL tablet Place 1 tablet (0.4 mg total) under the tongue every 5 (five) minutes as needed for chest pain.    pantoprazole (PROTONIX) 40 MG tablet TAKE 1 TABLET EVERY DAY    polyethylene glycol (MIRALAX / GLYCOLAX) 17 g packet Take 17 g by mouth daily as needed for mild constipation.    Rivaroxaban (XARELTO) 15 MG TABS tablet Take 1 tablet (15 mg total) by mouth daily with supper.     rosuvastatin (CRESTOR) 5 MG tablet TAKE 1 TABLET EVERY DAY    sacubitril-valsartan (ENTRESTO) 24-26 MG Take 1 tablet by mouth 2 (two) times daily.    tacrolimus (PROTOPIC) 0.1 % ointment Apply 1 application topically 2 (two) times daily.    tamsulosin (FLOMAX) 0.4 MG CAPS capsule Take 1 capsule (0.4 mg total) by mouth daily.    Tiotropium Bromide-Olodaterol (STIOLTO RESPIMAT) 2.5-2.5 MCG/ACT AERS Inhale 2 puffs into the lungs daily.    No facility-administered encounter medications on file as of 05/28/2021.    Have you had any problems recently with your health? Patients daughter denied any new problems with his health. She reported he does have a slight cough after covid but he is hanging in there.   Have you had any problems with your pharmacy? Patient 's daughter denied any problems with his current pharmacy.   What issues or side effects are you having with your medications? Patient denied any side effects or issues with his current medications.   What would you like me to pass along to [name], CPP for them to help you with?  Patients daughter did not have anything to pass along to CPP at this time.   What can we do to take care of you better? Patients daughter did not have any recommendations at this time.    Care Gaps  AWV:  done 05/07/21 Colonoscopy: due 04/01/20 DM Eye Exam:  DM Foot Exam:  Microalbumin: N/A HbgAIC: N/A DEXA:  N/A Mammogram: N/A  Star Rating Drugs: rosuvastatin (CRESTOR) 5 MG tablet - last filled 04/20/21 90 days   Future Appointments  Date Time Provider [place]  06/25/2021  8:00 AM [name], PA-C [facility]  06/30/2021  9:00 AM [name], [name], MD [facility] None  08/12/2021  3:45 PM [facility] PHARMACIST [facility] None  05/13/2022 12:00 PM [facility]-NURSE HEALTH ADVISOR [facility] None    Jobe Gibbon, [facility] Clinical Pharmacist Assistant  [phone]

## 2021-05-28 NOTE — Telephone Encounter (Signed)
Spoke with patient's daughter, she scheduled OV for Monday. She is aware if his symptoms increase or he has signs of respiratory distress, he will need immediate medical attention. Nothing further needed at this time.

## 2021-05-29 ENCOUNTER — Other Ambulatory Visit: Payer: Self-pay

## 2021-05-29 ENCOUNTER — Emergency Department (HOSPITAL_COMMUNITY)
Admission: EM | Admit: 2021-05-29 | Discharge: 2021-05-29 | Disposition: A | Discharge status: Home / Self Care | Payer: Medicare HMO | Attending: Emergency Medicine | Admitting: Emergency Medicine

## 2021-05-29 ENCOUNTER — Emergency Department (HOSPITAL_COMMUNITY): Payer: Medicare HMO

## 2021-05-29 DIAGNOSIS — R944 Abnormal results of kidney function studies: Secondary | ICD-10-CM | POA: Diagnosis not present

## 2021-05-29 DIAGNOSIS — E860 Dehydration: Secondary | ICD-10-CM

## 2021-05-29 DIAGNOSIS — R970 Elevated carcinoembryonic antigen [CEA]: Secondary | ICD-10-CM | POA: Diagnosis not present

## 2021-05-29 DIAGNOSIS — I4891 Unspecified atrial fibrillation: Secondary | ICD-10-CM | POA: Insufficient documentation

## 2021-05-29 DIAGNOSIS — Z8616 Personal history of COVID-19: Secondary | ICD-10-CM | POA: Insufficient documentation

## 2021-05-29 DIAGNOSIS — Z79899 Other long term (current) drug therapy: Secondary | ICD-10-CM | POA: Insufficient documentation

## 2021-05-29 DIAGNOSIS — I959 Hypotension, unspecified: Secondary | ICD-10-CM | POA: Diagnosis not present

## 2021-05-29 DIAGNOSIS — Z91040 Latex allergy status: Secondary | ICD-10-CM | POA: Insufficient documentation

## 2021-05-29 DIAGNOSIS — R059 Cough, unspecified: Secondary | ICD-10-CM | POA: Insufficient documentation

## 2021-05-29 DIAGNOSIS — R531 Weakness: Secondary | ICD-10-CM | POA: Diagnosis not present

## 2021-05-29 DIAGNOSIS — I517 Cardiomegaly: Secondary | ICD-10-CM | POA: Diagnosis not present

## 2021-05-29 DIAGNOSIS — R7989 Other specified abnormal findings of blood chemistry: Secondary | ICD-10-CM

## 2021-05-29 LAB — CBC WITH DIFFERENTIAL/PLATELET
Abs Immature Granulocytes: 0.04 10*3/uL (ref 0.00–0.07)
Basophils Absolute: 0 10*3/uL (ref 0.0–0.1)
Basophils Relative: 0 %
Eosinophils Absolute: 0.1 10*3/uL (ref 0.0–0.5)
Eosinophils Relative: 1 %
HCT: 33.7 % — ABNORMAL LOW (ref 39.0–52.0)
Hemoglobin: 10.9 g/dL — ABNORMAL LOW (ref 13.0–17.0)
Immature Granulocytes: 1 %
Lymphocytes Relative: 22 %
Lymphs Abs: 1.6 10*3/uL (ref 0.7–4.0)
MCH: 31.4 pg (ref 26.0–34.0)
MCHC: 32.3 g/dL (ref 30.0–36.0)
MCV: 97.1 fL (ref 80.0–100.0)
Monocytes Absolute: 1 10*3/uL (ref 0.1–1.0)
Monocytes Relative: 14 %
Neutro Abs: 4.5 10*3/uL (ref 1.7–7.7)
Neutrophils Relative %: 62 %
Platelets: 125 10*3/uL — ABNORMAL LOW (ref 150–400)
RBC: 3.47 MIL/uL — ABNORMAL LOW (ref 4.22–5.81)
RDW: 15 % (ref 11.5–15.5)
WBC: 7.3 10*3/uL (ref 4.0–10.5)
nRBC: 0 % (ref 0.0–0.2)

## 2021-05-29 LAB — BASIC METABOLIC PANEL
Anion gap: 9 (ref 5–15)
BUN: 25 mg/dL — ABNORMAL HIGH (ref 8–23)
CO2: 19 mmol/L — ABNORMAL LOW (ref 22–32)
Calcium: 8.4 mg/dL — ABNORMAL LOW (ref 8.9–10.3)
Chloride: 106 mmol/L (ref 98–111)
Creatinine, Ser: 1.74 mg/dL — ABNORMAL HIGH (ref 0.61–1.24)
GFR, Estimated: 37 mL/min — ABNORMAL LOW (ref >60)
Glucose, Bld: 95 mg/dL (ref 70–99)
Potassium: 4.7 mmol/L (ref 3.5–5.1)
Sodium: 134 mmol/L — ABNORMAL LOW (ref 135–145)

## 2021-05-29 LAB — URINALYSIS, ROUTINE W REFLEX MICROSCOPIC
Bilirubin Urine: NEGATIVE
Glucose, UA: NEGATIVE mg/dL
Hgb urine dipstick: NEGATIVE
Ketones, ur: NEGATIVE mg/dL
Leukocytes,Ua: NEGATIVE
Nitrite: NEGATIVE
Protein, ur: NEGATIVE mg/dL
Specific Gravity, Urine: 1.01 (ref 1.005–1.030)
pH: 6 (ref 5.0–8.0)

## 2021-05-29 LAB — MAGNESIUM: Magnesium: 2.2 mg/dL (ref 1.7–2.4)

## 2021-05-29 MED ORDER — SODIUM CHLORIDE 0.9 % IV BOLUS
500.0000 mL | Freq: Once | INTRAVENOUS | Status: AC
  Administered 2021-05-29: 500 mL via INTRAVENOUS

## 2021-05-29 NOTE — Discharge Instructions (Addendum)
Increase intake of fluids with Gatorade daily for the next week.  See your doctor within 1 week for repeat evaluation.  Your labs showed an increase in your creatinine which will need to be followed by your doctor.  Return to the ER if you are unable to keep down any fluids, have difficulty breathing pain or any additional concerns.

## 2021-05-29 NOTE — ED Provider Notes (Signed)
[facility] EMERGENCY DEPARTMENT Provider Note   CSN: [id number] Arrival date & time: 05/29/21  1452     History  No chief complaint on file.   Evan [name] is a 88 y.o. male.  Patient presents with complaint of generalized weakness occasional cough, decreased appetite symptoms ongoing for the past 3 weeks.  He was diagnosed with COVID about 3 weeks ago and has been home since then.  Family also noticed some irritation and redness in his inguinal region bilaterally.  Otherwise no recent fevers no vomiting or diarrhea reported.  Patient himself states he has no pain.  Reportedly has memory issues.      Home Medications Prior to Admission medications   Medication Sig Start Date End Date Taking? Authorizing Provider  acetaminophen (TYLENOL) 325 MG tablet Take 1-2 tablets (325-650 mg total) by mouth every 4 (four) hours as needed for mild pain. 08/09/18   Love, Ivan Anchors, PA-C  ALPRAZolam Duanne Moron) 0.5 MG tablet Take 0.5 tablets (0.25 mg total) by mouth 3 (three) times daily as needed for anxiety. 08/31/18   [name], MD  FLUoxetine (PROZAC) 20 MG capsule Take 1 capsule (20 mg total) by mouth daily. 06/10/20   [name], MD  furosemide (LASIX) 20 MG tablet TAKE 1 TABLET DAILY. MAY TAKE AN EXTRA 20 MG TABLET IF INCREASED SHORTNESS OF BREATH OR WEIGHT GAIN. 10/06/20   Jettie Booze, MD  HYDROcodone bit-homatropine [facility]) 5-1.5 MG/5ML syrup Take 5 mLs by mouth every 8 (eight) hours as needed for cough. 05/17/21   [name], MD  hydrOXYzine (ATARAX/VISTARIL) 10 MG tablet Take 1 tablet (10 mg total) by mouth at bedtime. 09/14/20   [name], MD  Lifitegrast Shirley Friar) 5 % SOLN Place 1-2 drops into both eyes daily.    [provider]  magnesium oxide (MAG-OX) 400 (240 Mg) MG tablet Take 1 tablet (400 mg total) by mouth daily. 11/30/20   [name], MD  metoprolol tartrate (LOPRESSOR) 25 MG tablet TAKE ONE-HALF TABLET (12.5 MG TOTAL)  BY MOUTH 2 (TWO) TIMES DAILY. 06/10/20   [name], MD  nitroGLYCERIN (NITROSTAT) 0.4 MG SL tablet Place 1 tablet (0.4 mg total) under the tongue every 5 (five) minutes as needed for chest pain. 02/01/21   [name], MD  pantoprazole (PROTONIX) 40 MG tablet TAKE 1 TABLET EVERY DAY 04/21/21   [name], MD  polyethylene glycol (MIRALAX / GLYCOLAX) 17 g packet Take 17 g by mouth daily as needed for mild constipation. 08/14/18   Angiulli, Lavon Paganini, PA-C  Rivaroxaban (XARELTO) 15 MG TABS tablet Take 1 tablet (15 mg total) by mouth daily with supper. 05/05/21   [name], MD  rosuvastatin (CRESTOR) 5 MG tablet TAKE 1 TABLET EVERY DAY 10/28/20   Jettie Booze, MD  sacubitril-valsartan (ENTRESTO) 24-26 MG Take 1 tablet by mouth 2 (two) times daily. 04/15/21   [name], MD  tacrolimus (PROTOPIC) 0.1 % ointment Apply 1 application topically 2 (two) times daily. 11/10/20   [name], MD  tamsulosin (FLOMAX) 0.4 MG CAPS capsule Take 1 capsule (0.4 mg total) by mouth daily. 03/11/21   [name], MD  Tiotropium Bromide-Olodaterol (STIOLTO RESPIMAT) 2.5-2.5 MCG/ACT AERS Inhale 2 puffs into the lungs daily. 04/01/21   [name], [name], MD      Allergies    Feldene [piroxicam], Neomycin-polymyxin-hc, Statins, Doxycycline, Latex, Tequin, and Valium    Review of Systems  Review of Systems  Constitutional:  Negative for fever.  HENT:  Negative for ear pain and sore throat.   Eyes:  Negative for pain.  Respiratory:  Positive for cough.   Cardiovascular:  Negative for chest pain.  Gastrointestinal:  Negative for abdominal pain.  Genitourinary:  Negative for flank pain.  Musculoskeletal:  Negative for back pain.  Skin:  Negative for color change and rash.  Neurological:  Negative for syncope.  All other systems reviewed and are negative.  Physical Exam Updated Vital Signs BP 138/87    Pulse (!) 58    Temp 98.1 F (36.7 C)    Resp 18    SpO2 97%   Physical Exam Constitutional:      Appearance: Normal appearance. He is well-developed.  HENT:     Head: Normocephalic.     Nose: Nose normal.  Eyes:     Extraocular Movements: Extraocular movements intact.  Cardiovascular:     Rate and Rhythm: Normal rate.  Pulmonary:     Effort: Pulmonary effort is normal.  Abdominal:     Tenderness: There is no abdominal tenderness. There is no rebound.  Skin:    Coloration: Skin is not jaundiced.  Neurological:     Mental Status: He is alert. Mental status is at baseline.    ED Results / Procedures / Treatments   Labs (all labs ordered are listed, but only abnormal results are displayed) Labs Reviewed  CBC WITH DIFFERENTIAL/PLATELET - Abnormal; Notable for the following components:      Result Value   RBC 3.47 (*)    Hemoglobin 10.9 (*)    HCT 33.7 (*)    Platelets 125 (*)    All other components within normal limits  BASIC METABOLIC PANEL - Abnormal; Notable for the following components:   Sodium 134 (*)    CO2 19 (*)    BUN 25 (*)    Creatinine, Ser 1.74 (*)    Calcium 8.4 (*)    GFR, Estimated 37 (*)    All other components within normal limits  URINALYSIS, ROUTINE W REFLEX MICROSCOPIC  MAGNESIUM    EKG EKG Interpretation  Date/Time:  Saturday May 29 2021 15:17:13 EST Ventricular Rate:  54 PR Interval:  60 QRS Duration: 105 QT Interval:  462 QTC Calculation: 438 R Axis:   -16 Text Interpretation: Atrial fibrillation Short PR interval Borderline left axis deviation Low voltage, precordial leads Nonspecific T abnormalities, diffuse leads Confirmed by Thamas Jaegers ([id number]) on 05/29/2021 4:23:45 PM  Radiology DG Chest Port 1 View  Result Date: 05/29/2021 CLINICAL DATA:  Cough.  Failure to thrive. EXAM: PORTABLE CHEST 1 VIEW COMPARISON:  04/01/2021 FINDINGS: Cardiomegaly. Previous median sternotomy and CABG. Chronic aortic atherosclerosis. Worsened interstitial pulmonary densities suggesting interstitial edema. No  infiltrate, collapse or effusion. No acute bone finding. IMPRESSION: Cardiomegaly. Aortic atherosclerosis. Previous CABG. Abnormal interstitial lung markings, worsened since the previous study, suggesting interstitial edema. Electronically Signed   By: Nelson Chimes M.D.   On: 05/29/2021 16:38    Procedures Procedures    Medications Ordered in ED Medications  sodium chloride 0.9 % bolus 500 mL (0 mLs Intravenous Stopped 05/29/21 1627)    ED Course/ Medical Decision Making/ A&P                           Medical Decision Making Amount and/or Complexity of Data Reviewed Labs: ordered. Radiology: ordered.   Patient maintained on monitor, sinus rhythm mild bradycardia,  no other arrhythmia noted.  Chart review shows phone calls with her primary care doctors yesterday.  Work-up today shows normal chemistry.  Mildly elevated creatinine noted 1.7 up from his baseline.  Given IV fluid resuscitation here.  Patient tolerating oral intake.  Urinalysis otherwise negative.  Advised increasing oral intake of fluids with Gatorade or other electrolyte water.  Family states this is something they would be able to do.  Recommended to keep the genital area dry with barrier cream or exposure while he is at home.  All questions were answered.  Patient is awake and no new focal deficit noted consistent with his history of prior stroke.  Advise follow-up with his doctor within the week and immediate return for worsening symptoms or any additional concerns.        Final Clinical Impression(s) / ED Diagnoses Final diagnoses:  Dehydration  Blood creatinine increased compared with prior measurement    Rx / DC Orders ED Discharge Orders     None         Luna Fuse, MD 05/29/21 [phone]

## 2021-05-29 NOTE — ED Notes (Signed)
DC instructions reviewed with pt. PT verbalized understanding. PT DC °

## 2021-05-29 NOTE — ED Triage Notes (Signed)
Pt BIB [facility] for failure to thrive.  PT tested posiive on home test about 3 wks ago and family states he has been declining since. He  has occasional cough, generalized weakness, decreased appetite.  EMS states the pt endorses concern about sores on bottom and family has concern about rash on genitals.    PT has some memory issues but is at his baseline.  Pt has hx of stroke with some right sided deficits.   EMs vitals 112/64, 97%, HR63, RR 18, CBG 168.

## 2021-05-31 ENCOUNTER — Encounter: Payer: Self-pay | Admitting: Family Medicine

## 2021-05-31 ENCOUNTER — Other Ambulatory Visit: Payer: Self-pay

## 2021-05-31 ENCOUNTER — Ambulatory Visit (INDEPENDENT_AMBULATORY_CARE_PROVIDER_SITE_OTHER): Payer: Medicare HMO | Admitting: Family Medicine

## 2021-05-31 VITALS — BP 98/48 | HR 69 | Temp 97.5°F | Resp 18 | Ht 63.0 in | Wt 165.0 lb

## 2021-05-31 DIAGNOSIS — R531 Weakness: Secondary | ICD-10-CM | POA: Diagnosis not present

## 2021-05-31 DIAGNOSIS — N183 Chronic kidney disease, stage 3 unspecified: Secondary | ICD-10-CM | POA: Diagnosis not present

## 2021-05-31 DIAGNOSIS — E860 Dehydration: Secondary | ICD-10-CM | POA: Diagnosis not present

## 2021-05-31 DIAGNOSIS — I5021 Acute systolic (congestive) heart failure: Secondary | ICD-10-CM

## 2021-05-31 DIAGNOSIS — M069 Rheumatoid arthritis, unspecified: Secondary | ICD-10-CM | POA: Diagnosis not present

## 2021-05-31 NOTE — Progress Notes (Signed)
Subjective:    Patient ID: Evan [name], male    DOB: 04-Jun-1927, 88 y.o.   MRN: [id number]  For the last 3 months, the patient has had increasing shortness of breath.  We obtained an echocardiogram that showed a decline in his ejection fraction 5%.  He is currently on Entresto as well as metoprolol congestive heart failure.  He is also on Lasix for pulmonary edema.  Unfortunately, recently he acquired COVID.  This seems to be breaking point for the patient.  He had severe diarrhea due to the antivirals.  He became progressively weaker and more short of breath.  He went to the hospital recently.  In the hospital he was found to have renal failure.  His creatinine had increased from 1.26-1.74.  He was given IV fluid.  However his chest x-ray showed interstitial pulmonary edema. Here today he is with his daughter.  He denies any shortness of breath.  She states that he is easily winded.  He gets short of breath walking from 1 side of the house to the other and frequently has to take breaks.  However he denies any chest pain.  He denies any orthopnea.  He is resting comfortably in the exam chair today.  His lungs are clear to auscultation bilaterally and he does not have significant edema. 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   Anginal chest pain at rest [facility])    Chronic non, controlled on Lorazepam   Anxiety    Arthritis    "all over"    BPH (benign prostatic hyperplasia)    CAD (coronary artery disease)    Chronic lower back pain    Complication of anesthesia    "problems making water afterwards"   Depression    DJD (degenerative joint disease)    Esophageal ulcer without bleeding    bid ppi indefinitely   Family history of adverse reaction to anesthesia    "children w/PONV"   GERD (gastroesophageal reflux disease)    Headache    "couple/week maybe" (09/04/2015)   Hemochromatosis    Possible, elevated iron stores, hook-like osteophytes on  hand films, normal LFTs   Hepatitis    "yellow jaundice as a baby"   History of ASCVD    MULTIVESSEL   Hyperlipidemia    Hypertension    Osteoarthritis    Pneumonia    "several times; got a little now" (09/04/2015)   Rheumatoid arthritis ([place])    Subdural hematoma    small after fall 09/2016-plavix held, neurosurgery consulted.  Asymptomatic.     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 lifelong since he failed PO Eliquis   Past Surgical History:  Procedure Laterality Date   ARM SKIN LESION BIOPSY / EXCISION Left 09/02/2015   CATARACT EXTRACTION W/ INTRAOCULAR LENS  IMPLANT, BILATERAL Bilateral    CHOLECYSTECTOMY OPEN     CORNEAL TRANSPLANT Bilateral    "one at [facility]; one at [facility]"   [place] W/ STENT     DE stent ostium into the right radial free graft at [facility]-, 02-2007   ESOPHAGOGASTRODUODENOSCOPY N/A 11/09/2014   Procedure: ESOPHAGOGASTRODUODENOSCOPY (EGD);  Surgeon: [name], MD;  Location: [facility] ENDOSCOPY;  Service: Endoscopy;  Laterality: N/A;   INGUINAL HERNIA REPAIR     JOINT REPLACEMENT     LEFT HEART CATH AND CORS/GRAFTS ANGIOGRAPHY N/A 06/27/2017  Procedure: LEFT HEART CATH AND CORS/GRAFTS ANGIOGRAPHY;  Surgeon: [name], MD;  Location: [facility] CV LAB;  Service: Cardiovascular;  Laterality: N/A;   NASAL SINUS SURGERY     SHOULDER OPEN ROTATOR CUFF REPAIR Right    TOTAL KNEE ARTHROPLASTY Bilateral    Current Outpatient Medications on File Prior to Visit  Medication Sig Dispense Refill   acetaminophen (TYLENOL) 325 MG tablet Take 1-2 tablets (325-650 mg total) by mouth every 4 (four) hours as needed for mild pain.     ALPRAZolam (XANAX) 0.5 MG tablet Take 0.5 tablets (0.25 mg total) by mouth 3 (three) times daily as needed for anxiety. 30 tablet 0   FLUoxetine (PROZAC) 20 MG capsule Take 1 capsule (20 mg total) by mouth daily. 90 capsule 3   furosemide  (LASIX) 20 MG tablet TAKE 1 TABLET DAILY. MAY TAKE AN EXTRA 20 MG TABLET IF INCREASED SHORTNESS OF BREATH OR WEIGHT GAIN. 135 tablet 2   HYDROcodone bit-homatropine (HYCODAN) 5-1.5 MG/5ML syrup Take 5 mLs by mouth every 8 (eight) hours as needed for cough. 120 mL 0   hydrOXYzine (ATARAX/VISTARIL) 10 MG tablet Take 1 tablet (10 mg total) by mouth at bedtime. 30 tablet 0   Lifitegrast (XIIDRA) 5 % SOLN Place 1-2 drops into both eyes daily.     magnesium oxide (MAG-OX) 400 (240 Mg) MG tablet Take 1 tablet (400 mg total) by mouth daily. 90 tablet 3   metoprolol tartrate (LOPRESSOR) 25 MG tablet TAKE ONE-HALF TABLET (12.5 MG TOTAL) BY MOUTH 2 (TWO) TIMES DAILY. 90 tablet 3   nitroGLYCERIN (NITROSTAT) 0.4 MG SL tablet Place 1 tablet (0.4 mg total) under the tongue every 5 (five) minutes as needed for chest pain. 25 tablet 2   pantoprazole (PROTONIX) 40 MG tablet TAKE 1 TABLET EVERY DAY 90 tablet 3   polyethylene glycol (MIRALAX / GLYCOLAX) 17 g packet Take 17 g by mouth daily as needed for mild constipation. 14 each 0   Rivaroxaban (XARELTO) 15 MG TABS tablet Take 1 tablet (15 mg total) by mouth daily with supper. 90 tablet 3   rosuvastatin (CRESTOR) 5 MG tablet TAKE 1 TABLET EVERY DAY 90 tablet 1   sacubitril-valsartan (ENTRESTO) 24-26 MG Take 1 tablet by mouth 2 (two) times daily. 60 tablet 1   tacrolimus (PROTOPIC) 0.1 % ointment Apply 1 application topically 2 (two) times daily. 100 g 3   tamsulosin (FLOMAX) 0.4 MG CAPS capsule Take 1 capsule (0.4 mg total) by mouth daily. 90 capsule 3   Tiotropium Bromide-Olodaterol (STIOLTO RESPIMAT) 2.5-2.5 MCG/ACT AERS Inhale 2 puffs into the lungs daily. 4 g 6   No current facility-administered medications on file prior to visit.   Allergies  Allergen Reactions   Feldene [Piroxicam] Other (See Comments)    REACTION: Blisters   Neomycin-Polymyxin-Hc Itching   Statins Other (See Comments)    Myalgias   Doxycycline Other (See Comments)    REACTION:  unknown    Latex Rash   Tequin Anxiety   Valium Other (See Comments)    REACTION:  unknown   Social History   Socioeconomic History   Marital status: Widowed    Spouse name: Not on file   Number of children: Not on file   Years of education: Not on file   Highest education level: Not on file  Occupational History   Not on file  Tobacco Use   Smoking status: Never    Passive exposure: Yes   Smokeless tobacco: Former  Types: Chew   Tobacco comments:    "quit chewing in the 1960s"  Vaping Use   Vaping Use: Never used  Substance and Sexual Activity   Alcohol use: No   Drug use: No   Sexual activity: Never  Other Topics Concern   Not on file  Social History Narrative   Not on file   Social Determinants of Health   Financial Resource Strain: Low Risk    Difficulty of Paying Living Expenses: Not hard at all  Food Insecurity: No Food Insecurity   Worried About Charity fundraiser in the Last Year: Never true   [place] in the Last Year: Never true  Transportation Needs: No Transportation Needs   Lack of Transportation (Medical): No   Lack of Transportation (Non-Medical): No  Physical Activity: Insufficiently Active   Days of Exercise per Week: 3 days   Minutes of Exercise per Session: 20 min  Stress: No Stress Concern Present   Feeling of Stress : Not at all  Social Connections: Socially Isolated   Frequency of Communication with Friends and Family: More than three times a week   Frequency of Social Gatherings with Friends and Family: More than three times a week   Attends Religious Services: Never   Marine scientist or Organizations: No   Attends Archivist Meetings: Never   Marital Status: Widowed  Human resources officer Violence: Not At Risk   Fear of Current or Ex-Partner: No   Emotionally Abused: No   Physically Abused: No   Sexually Abused: No      Review of Systems  Respiratory:  Positive for cough and shortness of breath.       Objective:    Physical Exam Vitals reviewed.  Constitutional:      General: He is not in acute distress.    Appearance: Normal appearance. He is not toxic-appearing or diaphoretic.  Cardiovascular:     Rate and Rhythm: Normal rate and regular rhythm.     Heart sounds: Normal heart sounds. No murmur heard.   No friction rub. No gallop.  Pulmonary:     Effort: Pulmonary effort is normal. No respiratory distress.     Breath sounds: Normal breath sounds. No wheezing, rhonchi or rales.  Musculoskeletal:     Right lower leg: No edema.     Left lower leg: No edema.  Neurological:     Mental Status: He is alert.  Right-sided hemiparesis with weakness in his right leg.  Weakness with knee flexion and extension and ankle flexion and extension.  Ataxia in the right upper arm and right lower leg.         Assessment & Plan:  Acute systolic congestive heart failure (HCC) - Plan: BASIC METABOLIC PANEL WITH GFR  Weakness  Stage 3 chronic kidney disease, unspecified whether stage 3a or 3b CKD ([place])  Dehydration Patient has congestive heart failure.  He currently takes metoprolol and Entresto.  He uses Lasix for pulmonary edema and swelling.  Recently COVID caused the patient to decompensate.  He also has severe diarrhea due to the antiviral medication.  As a result he became dehydrated.  This prompted him to go to the emergency room.  He seems to be doing better after receiving IV fluid.  I will recheck a BMP today.  At the present time I have elected to make no changes.  We will continue metoprolol and Entresto at his current dose.  I would not increase or decrease  Lasix as the patient appears euvolemic.  If he continues to have shortness of breath despite being euvolemic and his renal function remained stable, options would be to increase the Entresto although I do not feel that his renal function can tolerate it, add spironolactone, or add digoxin for symptomatic relief.  We discussed all these options and at  the present time we have elected simply to monitor the situation as he seems to be doing relatively well.

## 2021-06-01 LAB — BASIC METABOLIC PANEL WITH GFR
BUN/Creatinine Ratio: 13 (calc) (ref 6–22)
BUN: 19 mg/dL (ref 7–25)
CO2: 21 mmol/L (ref 20–32)
Calcium: 8.5 mg/dL — ABNORMAL LOW (ref 8.6–10.3)
Chloride: 106 mmol/L (ref 98–110)
Creat: 1.47 mg/dL — ABNORMAL HIGH (ref 0.70–1.22)
Glucose, Bld: 108 mg/dL — ABNORMAL HIGH (ref 65–99)
Potassium: 4.4 mmol/L (ref 3.5–5.3)
Sodium: 138 mmol/L (ref 135–146)
eGFR: 45 mL/min/{1.73_m2} — ABNORMAL LOW (ref >=60)

## 2021-06-03 ENCOUNTER — Telehealth: Payer: Self-pay | Admitting: Family Medicine

## 2021-06-03 NOTE — Telephone Encounter (Signed)
Received call from [place] (PTA) at [facility] to report patient's bp has been trending very low for almost 3 weeks; ranges between 80/50 to 100/65. Stated it tends to be closer to 80/50. Wants to know if provider would consider adjusting Entresto. Requesting for provider to communicate changes to patient's family directly at [phone].  ?Please advise at [phone] with questions. ?

## 2021-06-03 NOTE — Telephone Encounter (Signed)
Called to speak with patient's daughter, her husband answered the phone and states she is currently in surgery. He asked I call back tomorrow as she will be home and available to speak then.  ? ?Will call tomorrow.  ?

## 2021-06-04 NOTE — Telephone Encounter (Signed)
Spoke with patient's daughter and advised. She voiced understanding. She will call next week and let us know how patient is doing. Nothing further needed at this time.  ? ?

## 2021-06-09 ENCOUNTER — Other Ambulatory Visit (HOSPITAL_COMMUNITY): Payer: Self-pay

## 2021-06-09 ENCOUNTER — Other Ambulatory Visit: Payer: Self-pay | Admitting: Family Medicine

## 2021-06-10 ENCOUNTER — Other Ambulatory Visit (HOSPITAL_COMMUNITY): Payer: Self-pay

## 2021-06-10 MED ORDER — ENTRESTO 24-26 MG PO TABS
1.0000 | ORAL_TABLET | Freq: Two times a day (BID) | ORAL | 1 refills | Status: DC
  Filled 2021-06-10: qty 60, 30d supply, fill #0

## 2021-06-11 DIAGNOSIS — R3915 Urgency of urination: Secondary | ICD-10-CM | POA: Diagnosis not present

## 2021-06-23 ENCOUNTER — Encounter: Payer: Self-pay | Admitting: Physician Assistant

## 2021-06-23 ENCOUNTER — Telehealth: Payer: Self-pay | Admitting: Pharmacist

## 2021-06-23 DIAGNOSIS — I350 Nonrheumatic aortic (valve) stenosis: Secondary | ICD-10-CM | POA: Insufficient documentation

## 2021-06-23 HISTORY — DX: Nonrheumatic aortic (valve) stenosis: I35.0

## 2021-06-23 NOTE — Progress Notes (Signed)
? ? ?Chronic Care Management ?Pharmacy Assistant  ? ?Name: Evan [name]  MRN: [id number] DOB: [birth year] ? ? ? ?Reason for Encounter: Disease State - General Adherence Call  ?  ? ?Recent office visits:  ?05/31/21 Evan Luo, MD - Family Medicine - Systolic congestive heart failue - Labs were ordered. Patient received IV fluids via ER 90 05/28/21. No medication changes. Follow up if no improvement.  ? ?Recent consult visits:  ?None noted.  ? ? ?Hospital visits: 05/29/21 ?Medication Reconciliation was completed by comparing discharge summary, patient?s EMR and Pharmacy list, and upon discussion with patient. ? ?Admitted to the hospital on 05/29/21 due to Dehydration. Discharge date was 05/29/21. Discharged from [facility].   ? ?New?Medications Started at [facility] Discharge:?? ?None noted. ? ?Medication Changes at Hospital Discharge: ?None noted. ? ?Medications Discontinued at Hospital Discharge: ?None noted.  ? ?Medications that remain the same after Hospital Discharge:??  ?All other medications will remain the same.   ? ? ?Medications: ?Outpatient Encounter Medications as of 06/23/2021  ?Medication Sig Note  ? acetaminophen (TYLENOL) 325 MG tablet Take 1-2 tablets (325-650 mg total) by mouth every 4 (four) hours as needed for mild pain.   ? ALPRAZolam (XANAX) 0.5 MG tablet Take 0.5 tablets (0.25 mg total) by mouth 3 (three) times daily as needed for anxiety.   ? FLUoxetine (PROZAC) 20 MG capsule Take 1 capsule (20 mg total) by mouth daily.   ? furosemide (LASIX) 20 MG tablet TAKE 1 TABLET DAILY. MAY TAKE AN EXTRA 20 MG TABLET IF INCREASED SHORTNESS OF BREATH OR WEIGHT GAIN.   ? HYDROcodone bit-homatropine (HYCODAN) 5-1.5 MG/5ML syrup Take 5 mLs by mouth every 8 (eight) hours as needed for cough.   ? hydrOXYzine (ATARAX/VISTARIL) 10 MG tablet Take 1 tablet (10 mg total) by mouth at bedtime.   ? Lifitegrast (XIIDRA) 5 % SOLN Place 1-2 drops into both eyes daily.   ? magnesium oxide (MAG-OX) 400 (240 Mg) MG tablet  Take 1 tablet (400 mg total) by mouth daily.   ? metoprolol tartrate (LOPRESSOR) 25 MG tablet TAKE ONE-HALF TABLET (12.5 MG TOTAL) BY MOUTH 2 (TWO) TIMES DAILY. 01/28/2021: Patient only taking once daily  ? nitroGLYCERIN (NITROSTAT) 0.4 MG SL tablet Place 1 tablet (0.4 mg total) under the tongue every 5 (five) minutes as needed for chest pain.   ? pantoprazole (PROTONIX) 40 MG tablet TAKE 1 TABLET EVERY DAY   ? polyethylene glycol (MIRALAX / GLYCOLAX) 17 g packet Take 17 g by mouth daily as needed for mild constipation.   ? Rivaroxaban (XARELTO) 15 MG TABS tablet Take 1 tablet (15 mg total) by mouth daily with supper.   ? rosuvastatin (CRESTOR) 5 MG tablet TAKE 1 TABLET EVERY DAY   ? sacubitril-valsartan (ENTRESTO) 24-26 MG Take 1 tablet by mouth 2 (two) times daily.   ? tacrolimus (PROTOPIC) 0.1 % ointment Apply 1 application topically 2 (two) times daily.   ? tamsulosin (FLOMAX) 0.4 MG CAPS capsule Take 1 capsule (0.4 mg total) by mouth daily.   ? Tiotropium Bromide-Olodaterol (STIOLTO RESPIMAT) 2.5-2.5 MCG/ACT AERS Inhale 2 puffs into the lungs daily.   ? ?No facility-administered encounter medications on file as of 06/23/2021.  ? ? ?Have you had any problems recently with your health? ?Patient's caregiver stated patient was recently seen in ER for dehydration and was recently taken off Furosemide and is doing much better.  ? ?Have you had any problems with your pharmacy? ?Patient's caregiver denied any problems with current pharmacy. ? ?  What issues or side effects are you having with your medications? ?Patient's caregiver denied any side effects or issues with current medications.  ? ?What would you like me to pass along to [name], CPP for them to help you with?  ?Patient's caregiver did not have anything to pass along to CPP at this time.  ? ?What can we do to take care of you better? ?Patient's caregiver did not have any recommendations at this time. ? ? ?Care Gaps ?  ?AWV: done 05/07/21 ?Colonoscopy: due  04/01/20 ?DM Eye Exam: N/A ?DM Foot Exam: N/A ?Microalbumin: N/A ?HbgAIC: N/A ?DEXA:  N/A ?Mammogram: N/A ? ? ?Star Rating Drugs: ?rosuvastatin (CRESTOR) 5 MG tablet - last filled 04/20/21 90 days  ? ? ?Future Appointments  ?Date Time Provider [place]  ?06/25/2021  8:00 AM [name], PA-C [facility]  ?06/30/2021  9:00 AM [name], [name], MD [facility] None  ?05/13/2022 12:00 PM [facility]SE HEALTH ADVISOR [facility]  ? ? ? ?[name], CCMA ?Clinical Pharmacist Assistant  ?[phone] ? ? ?

## 2021-06-23 NOTE — Progress Notes (Signed)
?Cardiology Office Note:   ? ?Date:  06/25/2021  ? ?ID:  [name], DOB [birth year], MRN [id number] ? ?PCP:  [name], MD  ?[facility] Providers ?Cardiologist:  Larae Grooms, MD    ?Referring MD: [name], MD  ? ?Chief Complaint:  Follow-up for CHF ?  ? ?Patient Profile: ?Coronary artery disease ?S/p CABG in 1998 ?S/p Taxus DES to the LCx in 2008 and Taxus DES to the free radial-OM1 ?Cath 2019: Patent stent in the free radial-OM1, patent stent in the LCx, patent LIMA-LAD, mild plaque in the RCA ?HFmrEF (heart failure with mildly reduced ejection fraction)  ?Previously HFpEF  ?Echocardiogram 1/23: EF 45-50  ?Atrial fibrillation/flutter ?Rx with Lovenox after DVT ?anticoagulation DC'd after intracranial hemorrhage ?Restarted on anticoagulation with Xarelto in 2020 ?Aortic stenosis ?Echo 1/23: Mild with mean gradient 7 ?Hypertension ?Hyperlipidemia ?Lungs CA ?Hx DVT in 11/19 ?Hemochromatosis ?GERD ?Aortic atherosclerosis ?Hx CVA ?Hx intracranial hemorrhage ?Subdural hematoma after a fall in 6/18 ?Gout ?Compression fracture ? ?Prior CV Studies: ?Echocardiogram 04/13/2021 ?Lateral HK, EF 45-50, mild LVH, mildly reduced RVSF, normal PASP, moderate BAE, mild MR, mild aortic stenosis (mean gradient 7, DI 0.42) ? ?Echocardiogram 11/21/2018 ?EF 60-65, mild LVH, mildly reduced RVSF, mildly elevated RVSP, severe LAE, moderate RAE ? ?Cardiac catheterization 06/27/2017 ?RCA proximal 30, mid 10 ?LCx stent patent; OM1 80, OM2 65 ?LAD ostial 100 ?Free radial-OM1 stent patent ?LIMA-LAD patent ?Hyperdynamic LV function with Spaete like ventricle, EF 60-65 ? ?Myoview 09/07/2015 ?No ischemia or infarction, EF 57; low risk ? ?Holter 6/15 ?No sig arrhythmias ?  ?History of Present Illness:   ?Evan [name] is a 88 y.o. male with the above problem list.  He was last seen by Dr. Irish Lack and 3/22.  He was evaluated by pulmonology in December 2022 with nocturnal dyspnea.  Chest x-ray was obtained and was concerning for  pulmonary edema.  He was referred back to primary care.  A follow-up echocardiogram was obtained which demonstrated reduced LV function with an EF of 45-50% and lateral hypokinesis, which was new compared to previous studies.  The patient was diuresed with furosemide and placed on Entresto.  He is also on metoprolol tartrate.  He is seen back for further evaluation. ? ?He arrives in a wheelchair.  He has a brace on his right leg due to previous stroke.  He ambulates some with a walker at home.  He has not had syncope or near syncope.  He does note orthopnea.  This has been ongoing for several months.  He has not had significant leg edema.  He has not had chest pain.  He has not noted shortness of breath with his limited activity. ?   ?Past Medical History:  ?Diagnosis Date  ? Adenocarcinoma of right lung ([place]) 01/06/2016  ? Anginal chest pain at rest [employer])   ? Chronic non, controlled on Lorazepam  ? Anxiety   ? Arthritis   ? "all over"   ? BPH (benign prostatic hyperplasia)   ? CAD (coronary artery disease)   ? Chronic lower back pain   ? Complication of anesthesia   ? "problems making water afterwards"  ? Depression   ? DJD (degenerative joint disease)   ? Esophageal ulcer without bleeding   ? bid ppi indefinitely  ? Family history of adverse reaction to anesthesia   ? "children w/PONV"  ? GERD (gastroesophageal reflux disease)   ? Headache   ? "couple/week maybe" (09/04/2015)  ? Hemochromatosis   ? Possible,  elevated iron stores, hook-like osteophytes on hand films, normal LFTs  ? Hepatitis   ? "yellow jaundice as a baby"  ? History of ASCVD   ? MULTIVESSEL  ? Hyperlipidemia   ? Hypertension   ? Mild aortic stenosis 06/23/2021  ? Osteoarthritis   ? Pneumonia   ? "several times; got a little now" (09/04/2015)  ? Rheumatoid arthritis ([place])   ? Subdural hematoma   ? small after fall 09/2016-plavix held, neurosurgery consulted.  Asymptomatic.    ? 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[place]) 02/2018  ? on Lovenox lifelong since he failed PO  Eliquis  ? ?Current Medications: ?Current Meds  ?Medication Sig  ? acetaminophen (TYLENOL) 325 MG tablet Take 1-2 tablets (325-650 mg total) by mouth every 4 (four) hours as needed for mild pain.  ? ALPRAZolam (XANAX) 0.5 MG tablet Take 0.5 tablets (0.25 mg total) by mouth 3 (three) times daily as needed for anxiety.  ? FLUoxetine (PROZAC) 20 MG capsule Take 1 capsule (20 mg total) by mouth daily.  ? [START ON 06/27/2021] furosemide (LASIX) 20 MG tablet Take 1 tablet (20 mg total) by mouth daily.  ? HYDROcodone bit-homatropine (HYCODAN) 5-1.5 MG/5ML syrup Take 5 mLs by mouth every 8 (eight) hours as needed for cough.  ? hydrOXYzine (ATARAX/VISTARIL) 10 MG tablet Take 1 tablet (10 mg total) by mouth at bedtime.  ? magnesium oxide (MAG-OX) 400 MG tablet Take 400 mg by mouth 2 (two) times daily.  ? metoprolol tartrate (LOPRESSOR) 25 MG tablet Take 12.5 mg by mouth daily.  ? nitroGLYCERIN (NITROSTAT) 0.4 MG SL tablet Place 1 tablet (0.4 mg total) under the tongue every 5 (five) minutes as needed for chest pain.  ? pantoprazole (PROTONIX) 40 MG tablet TAKE 1 TABLET EVERY DAY  ? polyethylene glycol (MIRALAX / GLYCOLAX) 17 g packet Take 17 g by mouth daily as needed for mild constipation.  ? Rivaroxaban (XARELTO) 15 MG TABS tablet Take 1 tablet (15 mg total) by mouth daily with supper.  ? rosuvastatin (CRESTOR) 5 MG tablet TAKE 1 TABLET EVERY DAY  ? tacrolimus (PROTOPIC) 0.1 % ointment Apply 1 application topically 2 (two) times daily.  ? tamsulosin (FLOMAX) 0.4 MG CAPS capsule Take 1 capsule (0.4 mg total) by mouth daily.  ? Tiotropium Bromide-Olodaterol (STIOLTO RESPIMAT) 2.5-2.5 MCG/ACT AERS Inhale 2 puffs into the lungs daily.  ? [DISCONTINUED] magnesium oxide (MAG-OX) 400 (240 Mg) MG tablet Take 1 tablet (400 mg total) by mouth daily. (Patient taking differently: Take 400 mg by mouth 2 (two) times daily.)  ? [DISCONTINUED] metoprolol tartrate (LOPRESSOR) 25 MG tablet TAKE ONE-HALF TABLET (12.5 MG TOTAL) BY MOUTH 2  (TWO) TIMES DAILY. (Patient taking differently: Take 12.5 mg by mouth daily. TAKE ONE-HALF TABLET (12.5 MG TOTAL) BY MOUTH 2 (TWO) TIMES DAILY.)  ? [DISCONTINUED] sacubitril-valsartan (ENTRESTO) 24-26 MG Take 1 tablet by mouth 2 (two) times daily. (Patient taking differently: Take 1 tablet by mouth daily.)  ? [DISCONTINUED] sacubitril-valsartan (ENTRESTO) 24-26 MG Take 1 tablet by mouth daily.  ?  ?Allergies:   Feldene [piroxicam], Neomycin-polymyxin-hc, Statins, Doxycycline, Latex, Tequin, and Valium  ? ?Social History  ? ?Tobacco Use  ? Smoking status: Never  ?  Passive exposure: Yes  ? Smokeless tobacco: Former  ?  Types: Chew  ? Tobacco comments:  ?  "quit chewing in the 1960s"  ?Vaping Use  ? Vaping Use: Never used  ?Substance Use Topics  ? Alcohol use: No  ? Drug use: No  ?  ?Family Hx: ?The patient's family history  includes Arthritis-Osteo in his sister; Heart attack in his brother and another family member. There is no history of Cancer. ? ?Review of Systems  ?Respiratory:  Negative for hemoptysis.   ?Gastrointestinal:  Negative for hematochezia.  ?Genitourinary:  Negative for hematuria.   ? ?EKGs/Labs/Other Test Reviewed:   ? ?EKG:  EKG is not ordered today.  The ekg ordered today demonstrates n/a ? ?EKG dated 05/29/2021 personally reviewed and interpreted: Atrial fibrillation, HR 54, left axis deviation, T wave inversions V3-V6, QTc 438 (similar to previous tracing) ? ?Recent Labs: ?03/11/2021: ALT 34; Brain Natriuretic Peptide 175 ?05/29/2021: Hemoglobin 10.9; Magnesium 2.2; Platelets 125 ?05/31/2021: BUN 19; Creat 1.47; Potassium 4.4; Sodium 138  ? ?CrCl 35 mL/min  ? ?Recent Lipid Panel ?No results for input(s): CHOL, TRIG, HDL, VLDL, LDLCALC, LDLDIRECT in the last 8760 hours.  ? ?Risk Assessment/Calculations:   ? ?CHA2DS2-VASc Score = 7  ? This indicates a 11.2% annual risk of stroke. ?The patient's score is based upon: ?CHF History: 1 ?HTN History: 1 ?Diabetes History: 0 ?Stroke History: 2 ?Vascular  Disease History: 1 ?Age Score: 2 ?Gender Score: 0 ?  ? ?    ?Physical Exam:   ? ?VS:  BP (!) 88/46   Pulse 68   Ht 5\' 3"  (1.6 m)   Wt 153 lb 6.4 oz (69.6 kg)   SpO2 100%   BMI 27.17 kg/m?    ? ?Wt Readings from Last 3

## 2021-06-24 ENCOUNTER — Other Ambulatory Visit: Payer: Self-pay | Admitting: Interventional Cardiology

## 2021-06-24 NOTE — Telephone Encounter (Signed)
Prescription refill request for Xarelto received.  ?Indication: DVT/PE ?Last office visit: 06/22/20 Irish Lack) ?Weight: 74.8kg ?Age: 88 ?Scr: 1.47 (05/31/21) ?CrCl: 34.70ml/min ? ?Pt overdue for office visit; however, pt has scheduled appt with Richardson Dopp on 06/25/21. Prescription was sent on 05/05/21. [facility] and confirmed prescription was received and shipped.  ? ? ? ?

## 2021-06-25 ENCOUNTER — Encounter: Payer: Self-pay | Admitting: Physician Assistant

## 2021-06-25 ENCOUNTER — Other Ambulatory Visit: Payer: Self-pay

## 2021-06-25 ENCOUNTER — Ambulatory Visit: Payer: Medicare HMO | Admitting: Physician Assistant

## 2021-06-25 VITALS — BP 88/46 | HR 68 | Ht 63.0 in | Wt 153.4 lb

## 2021-06-25 DIAGNOSIS — I952 Hypotension due to drugs: Secondary | ICD-10-CM

## 2021-06-25 DIAGNOSIS — I69359 Hemiplegia and hemiparesis following cerebral infarction affecting unspecified side: Secondary | ICD-10-CM | POA: Insufficient documentation

## 2021-06-25 DIAGNOSIS — I4821 Permanent atrial fibrillation: Secondary | ICD-10-CM | POA: Diagnosis not present

## 2021-06-25 DIAGNOSIS — E785 Hyperlipidemia, unspecified: Secondary | ICD-10-CM | POA: Diagnosis not present

## 2021-06-25 DIAGNOSIS — I5020 Unspecified systolic (congestive) heart failure: Secondary | ICD-10-CM | POA: Diagnosis not present

## 2021-06-25 DIAGNOSIS — N1831 Chronic kidney disease, stage 3a: Secondary | ICD-10-CM

## 2021-06-25 DIAGNOSIS — I2510 Atherosclerotic heart disease of native coronary artery without angina pectoris: Secondary | ICD-10-CM | POA: Diagnosis not present

## 2021-06-25 DIAGNOSIS — I350 Nonrheumatic aortic (valve) stenosis: Secondary | ICD-10-CM

## 2021-06-25 DIAGNOSIS — I10 Essential (primary) hypertension: Secondary | ICD-10-CM

## 2021-06-25 DIAGNOSIS — C3491 Malignant neoplasm of unspecified part of right bronchus or lung: Secondary | ICD-10-CM

## 2021-06-25 MED ORDER — FUROSEMIDE 20 MG PO TABS: 20.0000 mg | ORAL_TABLET | Freq: Every day | ORAL | 3 refills | Status: DC

## 2021-06-25 NOTE — Assessment & Plan Note (Signed)
Mean gradient 7 by echocardiogram in 1/23. ?

## 2021-06-25 NOTE — Assessment & Plan Note (Signed)
Continue rosuvastatin 5 mg daily. ?

## 2021-06-25 NOTE — Assessment & Plan Note (Signed)
Rate controlled.  Continue Xarelto 15 mg daily.  Creatinine clearance 35. ?

## 2021-06-25 NOTE — Assessment & Plan Note (Signed)
History of CABG in 1998 and PCI to the LCx and radial to [place] in 2008.  He had stable anatomy in 2019 by cardiac catheterization.  Recent echocardiogram does demonstrate reduced LV function with new lateral hypokinesis.  He is not having anginal symptoms.  As noted, I have recommended pursuing medical therapy and avoiding invasive evaluation if possible.  He is in agreement with this.  He is not on aspirin as he is on Xarelto.  Continue rosuvastatin 5 mg daily, metoprolol tartrate 12.5 mg daily. ?

## 2021-06-25 NOTE — Assessment & Plan Note (Signed)
He wears a brace on his R leg for foot drop. ?

## 2021-06-25 NOTE — Assessment & Plan Note (Signed)
Blood pressure has always run low.  It has been running lower since he was placed on Entresto.  Stop Entresto as noted.  Obtain BMET, CBC today. ?

## 2021-06-25 NOTE — Patient Instructions (Signed)
Medication Instructions:  ?Your physician has recommended you make the following change in your medication:  ? RESUME the Lasix 20 mg daily on Sunday, 3/26 ? STOP Entresto ? ?*If you need a refill on your cardiac medications before your next appointment, please call your pharmacy* ? ? ?Lab Work: ?TODAY:  BMET, CBC, & PRO BNP ? ?If you have labs (blood work) drawn today and your tests are completely normal, you will receive your results only by: ?MyChart Message (if you have MyChart) OR ?A paper copy in the mail ?If you have any lab test that is abnormal or we need to change your treatment, we will call you to review the results. ? ? ?Testing/Procedures: ?None ordered ? ? ?Follow-Up: ?At [facility], you and your health needs are our priority.  As part of our continuing mission to provide you with exceptional heart care, we have created designated Provider Care Teams.  These Care Teams include your primary Cardiologist (physician) and Advanced Practice Providers (APPs -  Physician Assistants and Nurse Practitioners) who all work together to provide you with the care you need, when you need it. ? ?We recommend signing up for the patient portal called "MyChart".  Sign up information is provided on this After Visit Summary.  MyChart is used to connect with patients for Virtual Visits (Telemedicine).  Patients are able to view lab/test results, encounter notes, upcoming appointments, etc.  Non-urgent messages can be sent to your provider as well.   ?To learn more about what you can do with MyChart, go to [email or web address].   ? ?Your next appointment:   ?07/28/21  ? ?The format for your next appointment:   ?In Person ? ?Provider:   ?Richardson Dopp, PA-C       ? ? ?Other Instructions ? ?

## 2021-06-25 NOTE — Assessment & Plan Note (Addendum)
He was seen by pulmonology in December with evidence of pulmonary edema on chest x-ray.  His echocardiogram demonstrated mildly reduced LV function with an EF of 45-50 and lateral hypokinesis.  He likely has an ischemic cardiomyopathy.  His blood pressure has been running low and his [name] has been reduced.  He has had a low blood pressure in the past but it has been running lower since he had COVID a few months ago.  His Entresto has been reduced to once daily and his Lasix has been DCd.  Repeat BP by me today was 100/50.  He has not had Entresto yet today.  Given his advanced age and comorbid conditions, I do not think we should pursue invasive testing to evaluate his cardiomyopathy (cardiac catheterization).  We will try to pursue med Rx.  He is not really tolerating Entresto due to low BP and he is only on a small dose of beta-blocker.  Currently, his volume status is stable. ?? Discontinue Entresto ?? Resume Lasix 20 mg daily on Sunday ?? BMET, CBC, BNP ?? Follow-up 4 weeks ?? Consider addition of low-dose ACE inhibitor at follow-up ?

## 2021-06-26 LAB — CBC
Hematocrit: 31.2 % — ABNORMAL LOW (ref 37.5–51.0)
Hemoglobin: 10.3 g/dL — ABNORMAL LOW (ref 13.0–17.7)
MCH: 30.9 pg (ref 26.6–33.0)
MCHC: 33 g/dL (ref 31.5–35.7)
MCV: 94 fL (ref 79–97)
Platelets: 117 10*3/uL — ABNORMAL LOW (ref 150–450)
RBC: 3.33 x10E6/uL — ABNORMAL LOW (ref 4.14–5.80)
RDW: 13.9 % (ref 11.6–15.4)
WBC: 6.4 10*3/uL (ref 3.4–10.8)

## 2021-06-26 LAB — PRO B NATRIURETIC PEPTIDE: NT-Pro BNP: 3088 pg/mL — ABNORMAL HIGH (ref 0–486)

## 2021-06-26 LAB — BASIC METABOLIC PANEL
BUN/Creatinine Ratio: 13 (ref 10–24)
BUN: 18 mg/dL (ref 10–36)
CO2: 20 mmol/L (ref 20–29)
Calcium: 8.8 mg/dL (ref 8.6–10.2)
Chloride: 103 mmol/L (ref 96–106)
Creatinine, Ser: 1.34 mg/dL — ABNORMAL HIGH (ref 0.76–1.27)
Glucose: 98 mg/dL (ref 70–99)
Potassium: 4.6 mmol/L (ref 3.5–5.2)
Sodium: 135 mmol/L (ref 134–144)
eGFR: 50 mL/min/{1.73_m2} — ABNORMAL LOW (ref >59)

## 2021-06-28 ENCOUNTER — Telehealth: Payer: Self-pay | Admitting: *Deleted

## 2021-06-28 DIAGNOSIS — Z79899 Other long term (current) drug therapy: Secondary | ICD-10-CM

## 2021-06-28 MED ORDER — FUROSEMIDE 40 MG PO TABS: 40.0000 mg | ORAL_TABLET | Freq: Every day | ORAL | 3 refills | Status: DC

## 2021-06-28 NOTE — Telephone Encounter (Signed)
-----   Message from Liliane Shi, [place] sent at 06/27/2021  9:11 PM EDT ----- ?Creatinine, Hgb stable.  K+ normal.  Plt count low but stable.  BNP high. ?PLAN:  ?-Increase Lasix to 40 mg once daily  ?-Repeat BMET in 1 week ?Richardson Dopp, PA-C    ?06/27/2021 9:05 PM   ?

## 2021-06-30 ENCOUNTER — Ambulatory Visit: Payer: Medicare HMO | Admitting: Pulmonary Disease

## 2021-07-04 ENCOUNTER — Other Ambulatory Visit: Payer: Self-pay | Admitting: Interventional Cardiology

## 2021-07-05 ENCOUNTER — Other Ambulatory Visit: Payer: Medicare HMO

## 2021-07-05 DIAGNOSIS — R1310 Dysphagia, unspecified: Secondary | ICD-10-CM | POA: Diagnosis not present

## 2021-07-05 DIAGNOSIS — U071 COVID-19: Secondary | ICD-10-CM | POA: Diagnosis not present

## 2021-07-05 DIAGNOSIS — I13 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 failure and stage 1 through stage 4 chronic kidney disease, or unspecified chronic kidney disease: Secondary | ICD-10-CM | POA: Diagnosis not present

## 2021-07-05 DIAGNOSIS — I69391 Dysphagia following cerebral infarction: Secondary | ICD-10-CM | POA: Diagnosis not present

## 2021-07-05 DIAGNOSIS — J449 Chronic obstructive pulmonary disease, unspecified: Secondary | ICD-10-CM | POA: Diagnosis not present

## 2021-07-05 DIAGNOSIS — I69354 Hemiplegia and hemiparesis following cerebral infarction affecting left non-dominant side: Secondary | ICD-10-CM | POA: Diagnosis not present

## 2021-07-05 NOTE — Telephone Encounter (Signed)
Pt last saw Richardson Dopp, PA on 06/25/21, last labs 06/25/21 Creat 1.34, age 88, weight 69.6kg, CrCl 35.35, based on CrCl pt is on appropriate dosage of Xarelto 15mg  QD for afib.  Will refill rx.  ?

## 2021-07-06 ENCOUNTER — Other Ambulatory Visit: Payer: Medicare HMO | Admitting: *Deleted

## 2021-07-06 ENCOUNTER — Ambulatory Visit: Payer: Medicare HMO | Admitting: Pulmonary Disease

## 2021-07-06 DIAGNOSIS — Z79899 Other long term (current) drug therapy: Secondary | ICD-10-CM

## 2021-07-06 LAB — BASIC METABOLIC PANEL
BUN/Creatinine Ratio: 18 (ref 10–24)
BUN: 25 mg/dL (ref 10–36)
CO2: 18 mmol/L — ABNORMAL LOW (ref 20–29)
Calcium: 8.9 mg/dL (ref 8.6–10.2)
Chloride: 102 mmol/L (ref 96–106)
Creatinine, Ser: 1.41 mg/dL — ABNORMAL HIGH (ref 0.76–1.27)
Glucose: 161 mg/dL — ABNORMAL HIGH (ref 70–99)
Potassium: 4.1 mmol/L (ref 3.5–5.2)
Sodium: 134 mmol/L (ref 134–144)
eGFR: 47 mL/min/{1.73_m2} — ABNORMAL LOW (ref >59)

## 2021-07-08 ENCOUNTER — Ambulatory Visit (INDEPENDENT_AMBULATORY_CARE_PROVIDER_SITE_OTHER): Payer: Medicare HMO | Admitting: Family Medicine

## 2021-07-08 ENCOUNTER — Other Ambulatory Visit (HOSPITAL_COMMUNITY): Payer: Self-pay

## 2021-07-08 VITALS — BP 120/80 | HR 82 | Temp 98.6°F | Resp 18

## 2021-07-08 DIAGNOSIS — R300 Dysuria: Secondary | ICD-10-CM

## 2021-07-08 DIAGNOSIS — R234 Changes in skin texture: Secondary | ICD-10-CM

## 2021-07-08 MED ORDER — LIDOCAINE 5 % EX OINT
1.0000 "application " | TOPICAL_OINTMENT | CUTANEOUS | 0 refills | Status: DC | PRN
  Filled 2021-07-08: qty 30, 30d supply, fill #0

## 2021-07-08 MED ORDER — SILVER SULFADIAZINE 1 % EX CREA
1.0000 | TOPICAL_CREAM | Freq: Every day | CUTANEOUS | 0 refills | Status: DC
Start: 2021-07-08 — End: 2021-07-28
  Filled 2021-07-08: qty 50, 50d supply, fill #0

## 2021-07-08 NOTE — Progress Notes (Signed)
? ? ?Subjective:  ? ? Patient ID: Evan [name], male    DOB: 1927-08-31, 88 y.o.   MRN: [id number] ?Patient reports a 1 week history of pain at the superior aspect of his gluteal cleft.  The skin at the superior aspect of the gluteal cleft is fissured.  There is no bleeding.  There is no subcutaneous fascia or breakdown through the dermis.  There is no drainage.  There is no surrounding erythema to suggest secondary infection or cellulitis however extremely tender to sit in that area.  It is directly overlying the coccyx. ?Past Medical History:  ?Diagnosis Date  ? Adenocarcinoma of right lung ([place]) 01/06/2016  ? Anginal chest pain at rest [facility])   ? Chronic non, controlled on Lorazepam  ? Anxiety   ? Arthritis   ? "all over"   ? BPH (benign prostatic hyperplasia)   ? CAD (coronary artery disease)   ? Chronic lower back pain   ? Complication of anesthesia   ? "problems making water afterwards"  ? Depression   ? DJD (degenerative joint disease)   ? Esophageal ulcer without bleeding   ? bid ppi indefinitely  ? Family history of adverse reaction to anesthesia   ? "children w/PONV"  ? GERD (gastroesophageal reflux disease)   ? Headache   ? "couple/week maybe" (09/04/2015)  ? Hemochromatosis   ? Possible, elevated iron stores, hook-like osteophytes on hand films, normal LFTs  ? Hepatitis   ? "yellow jaundice as a baby"  ? History of ASCVD   ? MULTIVESSEL  ? Hyperlipidemia   ? Hypertension   ? Mild aortic stenosis 06/23/2021  ? Osteoarthritis   ? Pneumonia   ? "several times; got a little now" (09/04/2015)  ? Rheumatoid arthritis ([place])   ? Subdural hematoma   ? small after fall 09/2016-plavix held, neurosurgery consulted.  Asymptomatic.    ? 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[place]) 02/2018  ? on Lovenox lifelong since he failed PO Eliquis  ? ?Past Surgical History:  ?Procedure Laterality Date  ? ARM SKIN LESION BIOPSY / EXCISION Left 09/02/2015  ? CATARACT EXTRACTION W/ INTRAOCULAR LENS  IMPLANT, BILATERAL Bilateral   ? CHOLECYSTECTOMY OPEN    ?  CORNEAL TRANSPLANT Bilateral   ? "one at [facility]; one at [facility]"  ? CORONARY ANGIOPLASTY WITH STENT PLACEMENT    ? CORONARY ARTERY BYPASS GRAFT  1999  ? DESCENDING AORTIC ANEURYSM REPAIR W/ STENT    ? DE stent ostium into the right radial free graft at OM-, 02-2007  ? ESOPHAGOGASTRODUODENOSCOPY N/A 11/09/2014  ? Procedure: ESOPHAGOGASTRODUODENOSCOPY (EGD);  Surgeon: [name], MD;  Location: [facility] ENDOSCOPY;  Service: Endoscopy;  Laterality: N/A;  ? INGUINAL HERNIA REPAIR    ? JOINT REPLACEMENT    ? LEFT HEART CATH AND CORS/GRAFTS ANGIOGRAPHY N/A 06/27/2017  ? Procedure: LEFT HEART CATH AND CORS/GRAFTS ANGIOGRAPHY;  Surgeon: [name], MD;  Location: [facility];  Service: Cardiovascular;  Laterality: N/A;  ? NASAL SINUS SURGERY    ? SHOULDER OPEN ROTATOR CUFF REPAIR Right   ? TOTAL KNEE ARTHROPLASTY Bilateral   ? ?Current Outpatient Medications on File Prior to Visit  ?Medication Sig Dispense Refill  ? acetaminophen (TYLENOL) 325 MG tablet Take 1-2 tablets (325-650 mg total) by mouth every 4 (four) hours as needed for mild pain.    ? ALPRAZolam (XANAX) 0.5 MG tablet Take 0.5 tablets (0.25 mg total) by mouth 3 (three) times daily as needed for anxiety. 30 tablet 0  ? FLUoxetine (PROZAC) 20 MG  capsule Take 1 capsule (20 mg total) by mouth daily. 90 capsule 3  ? furosemide (LASIX) 40 MG tablet Take 1 tablet (40 mg total) by mouth daily. 90 tablet 3  ? HYDROcodone bit-homatropine (HYCODAN) 5-1.5 MG/5ML syrup Take 5 mLs by mouth every 8 (eight) hours as needed for cough. 120 mL 0  ? hydrOXYzine (ATARAX/VISTARIL) 10 MG tablet Take 1 tablet (10 mg total) by mouth at bedtime. 30 tablet 0  ? magnesium oxide (MAG-OX) 400 MG tablet Take 400 mg by mouth 2 (two) times daily.    ? metoprolol tartrate (LOPRESSOR) 25 MG tablet Take 12.5 mg by mouth daily.    ? nitroGLYCERIN (NITROSTAT) 0.4 MG SL tablet Place 1 tablet (0.4 mg total) under the tongue every 5 (five) minutes as needed for chest pain. 25 tablet 2  ? pantoprazole  (PROTONIX) 40 MG tablet TAKE 1 TABLET EVERY DAY 90 tablet 3  ? polyethylene glycol (MIRALAX / GLYCOLAX) 17 g packet Take 17 g by mouth daily as needed for mild constipation. 14 each 0  ? Rivaroxaban (XARELTO) 15 MG TABS tablet TAKE 1 TABLET BY MOUTH DAILY WITH SUPPER. 90 tablet 1  ? rosuvastatin (CRESTOR) 5 MG tablet TAKE 1 TABLET EVERY DAY 90 tablet 1  ? tacrolimus (PROTOPIC) 0.1 % ointment Apply 1 application topically 2 (two) times daily. 100 g 3  ? tamsulosin (FLOMAX) 0.4 MG CAPS capsule Take 1 capsule (0.4 mg total) by mouth daily. 90 capsule 3  ? Tiotropium Bromide-Olodaterol (STIOLTO RESPIMAT) 2.5-2.5 MCG/ACT AERS Inhale 2 puffs into the lungs daily. 4 g 6  ? ?No current facility-administered medications on file prior to visit.  ? ?Allergies  ?Allergen Reactions  ? Feldene [Piroxicam] Other (See Comments)  ?  REACTION: Blisters  ? Neomycin-Polymyxin-Hc Itching  ? Statins Other (See Comments)  ?  Myalgias  ? Doxycycline Other (See Comments)  ?  REACTION:  unknown  ? Latex Rash  ? Tequin Anxiety  ? Valium Other (See Comments)  ?  REACTION:  unknown  ? ?Social History  ? ?Socioeconomic History  ? Marital status: Widowed  ?  Spouse name: Not on file  ? Number of children: Not on file  ? Years of education: Not on file  ? Highest education level: Not on file  ?Occupational History  ? Not on file  ?Tobacco Use  ? Smoking status: Never  ?  Passive exposure: Yes  ? Smokeless tobacco: Former  ?  Types: Chew  ? Tobacco comments:  ?  "quit chewing in the 1960s"  ?Vaping Use  ? Vaping Use: Never used  ?Substance and Sexual Activity  ? Alcohol use: No  ? Drug use: No  ? Sexual activity: Never  ?Other Topics Concern  ? Not on file  ?Social History Narrative  ? Not on file  ? ?Social Determinants of Health  ? ?Financial Resource Strain: Low Risk   ? Difficulty of Paying Living Expenses: Not hard at all  ?Food Insecurity: No Food Insecurity  ? Worried About Charity fundraiser in the Last Year: Never true  ? Ran Out of Food  in the Last Year: Never true  ?Transportation Needs: No Transportation Needs  ? Lack of Transportation (Medical): No  ? Lack of Transportation (Non-Medical): No  ?Physical Activity: Insufficiently Active  ? Days of Exercise per Week: 3 days  ? Minutes of Exercise per Session: 20 min  ?Stress: No Stress Concern Present  ? Feeling of Stress : Not at all  ?Social  Connections: Socially Isolated  ? Frequency of Communication with Friends and Family: More than three times a week  ? Frequency of Social Gatherings with Friends and Family: More than three times a week  ? Attends Religious Services: Never  ? Active Member of Clubs or Organizations: No  ? Attends Archivist Meetings: Never  ? Marital Status: Widowed  ?Intimate Partner Violence: Not At Risk  ? Fear of Current or Ex-Partner: No  ? Emotionally Abused: No  ? Physically Abused: No  ? Sexually Abused: No  ? ? ? ? ?Review of Systems  ?Respiratory:  Positive for cough and shortness of breath.   ? ?   ?Objective:  ? Physical Exam ?Vitals reviewed.  ?Constitutional:   ?   General: He is not in acute distress. ?   Appearance: Normal appearance. He is not toxic-appearing or diaphoretic.  ?Cardiovascular:  ?   Rate and Rhythm: Normal rate and regular rhythm.  ?   Heart sounds: Normal heart sounds. No murmur heard. ?  No friction rub. No gallop.  ?Pulmonary:  ?   Effort: Pulmonary effort is normal. No respiratory distress.  ?   Breath sounds: Normal breath sounds. No wheezing, rhonchi or rales.  ?Musculoskeletal:  ?   Right lower leg: No edema.  ?   Left lower leg: No edema.  ?     Legs: ? ?Neurological:  ?   Mental Status: He is alert.  ?Right-sided hemiparesis with weakness in his right leg.  Weakness with knee flexion and extension and ankle flexion and extension.  Ataxia in the right upper arm and right lower leg. ? ? ? ? ? ?   ?Assessment & Plan:  ?Dysuria - Plan: Urinalysis, Routine w reflex microscopic ? ?Skin fissure ?Patient has a skin fissure in the  superior aspect of the gluteal cleft.  Recommended offloading pressure from that area as I believe this is impeding healing.  He can also apply Silvadene cream 2-3 times a day to help prevent secondary infection.

## 2021-07-21 ENCOUNTER — Ambulatory Visit: Payer: Medicare HMO | Admitting: Pulmonary Disease

## 2021-07-23 DIAGNOSIS — R3915 Urgency of urination: Secondary | ICD-10-CM | POA: Diagnosis not present

## 2021-07-27 NOTE — Progress Notes (Addendum)
?Cardiology Office Note:   ? ?Date:  07/28/2021  ? ?ID:  Evan [name], DOB 08-22-1927, MRN [id number] ? ?PCP:  [name], MD  ?[facility] Providers ?Cardiologist:  Larae Grooms, MD    ?Referring MD: [name], MD  ? ?Chief Complaint:  F/u for CHF ?  ? ?Patient Profile: ?Coronary artery disease ?S/p CABG in 1998 ?S/p Taxus DES to the LCx in 2008 and Taxus DES to the free radial-OM1 ?Cath 2019: Patent stent in the free radial-OM1, patent stent in the LCx, patent LIMA-LAD, mild plaque in the RCA ?HFmrEF (heart failure with mildly reduced ejection fraction)  ?Previously HFpEF  ?Echocardiogram 1/23: EF 45-50  ?Atrial fibrillation/flutter ?Rx with Lovenox after DVT ?anticoagulation DC'd after intracranial hemorrhage ?Restarted on anticoagulation with Xarelto in 2020 ?Aortic stenosis ?Echo 1/23: Mild with mean gradient 7 ?Hypertension ?Hyperlipidemia ?Lungs CA ?Hx DVT in 11/19 ?Hemochromatosis ?GERD ?Aortic atherosclerosis ?Hx CVA ?Hx intracranial hemorrhage ?Subdural hematoma after a fall in 6/18 ?Gout ?Compression fracture ?  ?Prior CV Studies: ?Echocardiogram 04/13/2021 ?Lateral HK, EF 45-50, mild LVH, mildly reduced RVSF, normal PASP, moderate BAE, mild MR, mild aortic stenosis (mean gradient 7, DI 0.42) ?  ?Echocardiogram 11/21/2018 ?EF 60-65, mild LVH, mildly reduced RVSF, mildly elevated RVSP, severe LAE, moderate RAE ?  ?Cardiac catheterization 06/27/2017 ?RCA proximal 30, mid 10 ?LCx stent patent; OM1 80, OM2 65 ?LAD ostial 100 ?Free radial-OM1 stent patent ?LIMA-LAD patent ?Hyperdynamic LV function with Spaete like ventricle, EF 60-65 ?  ?Myoview 09/07/2015 ?No ischemia or infarction, EF 57; low risk ?  ?Holter 6/15 ?No sig arrhythmias ?  ? ?History of Present Illness:   ?Evan [name] is a 88 y.o. male with the above problem list.  He was last seen in March 2023.  He had been placed on Entresto for HFmrEF.  His BP was running too low.  I stopped his Entresto.  He returns for f/u.  He is here  today with his daughter.  He is feeling better since we stopped his Entresto and increased his Lasix.  She thought he was a little dehydrated recently and reduced his Lasix to 40 mg once daily except 20 mg on 2 days a week.  He has not had chest pain, shortness of breath, syncope, leg edema. ?   ?Past Medical History:  ?Diagnosis Date  ? Adenocarcinoma of right lung ([place]) 01/06/2016  ? Anginal chest pain at rest [facility])   ? Chronic non, controlled on Lorazepam  ? Anxiety   ? Arthritis   ? "all over"   ? BPH (benign prostatic hyperplasia)   ? CAD (coronary artery disease)   ? Chronic lower back pain   ? Complication of anesthesia   ? "problems making water afterwards"  ? Depression   ? DJD (degenerative joint disease)   ? Esophageal ulcer without bleeding   ? bid ppi indefinitely  ? Family history of adverse reaction to anesthesia   ? "children w/PONV"  ? GERD (gastroesophageal reflux disease)   ? Headache   ? "couple/week maybe" (09/04/2015)  ? Hemochromatosis   ? Possible, elevated iron stores, hook-like osteophytes on hand films, normal LFTs  ? Hepatitis   ? "yellow jaundice as a baby"  ? History of ASCVD   ? MULTIVESSEL  ? Hyperlipidemia   ? Hypertension   ? Mild aortic stenosis 06/23/2021  ? Osteoarthritis   ? Pneumonia   ? "several times; got a little now" (09/04/2015)  ? Rheumatoid arthritis ([place])   ?  Subdural hematoma ([facility])   ? small after fall 09/2016-plavix held, neurosurgery consulted.  Asymptomatic.    ? 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[place]) 02/2018  ? on Lovenox lifelong since he failed PO Eliquis  ? ?Current Medications: ?Current Meds  ?Medication Sig  ? acetaminophen (TYLENOL) 325 MG tablet Take 1-2 tablets (325-650 mg total) by mouth every 4 (four) hours as needed for mild pain.  ? ALPRAZolam (XANAX) 0.5 MG tablet Take 0.5 tablets (0.25 mg total) by mouth 3 (three) times daily as needed for anxiety.  ? FLUoxetine (PROZAC) 20 MG capsule Take 1 capsule (20 mg total) by mouth daily.  ? furosemide (LASIX) 40 MG tablet Take 1  tablet (40 mg total) by mouth daily.  ? magnesium oxide (MAG-OX) 400 MG tablet Take 400 mg by mouth 2 (two) times daily.  ? metoprolol tartrate (LOPRESSOR) 25 MG tablet Take 12.5 mg by mouth daily.  ? nitroGLYCERIN (NITROSTAT) 0.4 MG SL tablet Place 1 tablet (0.4 mg total) under the tongue every 5 (five) minutes as needed for chest pain.  ? pantoprazole (PROTONIX) 40 MG tablet TAKE 1 TABLET EVERY DAY  ? polyethylene glycol (MIRALAX / GLYCOLAX) 17 g packet Take 17 g by mouth daily as needed for mild constipation.  ? Rivaroxaban (XARELTO) 15 MG TABS tablet TAKE 1 TABLET BY MOUTH DAILY WITH SUPPER.  ? rosuvastatin (CRESTOR) 5 MG tablet TAKE 1 TABLET EVERY DAY  ? tacrolimus (PROTOPIC) 0.1 % ointment Apply 1 application topically 2 (two) times daily.  ? tamsulosin (FLOMAX) 0.4 MG CAPS capsule Take 1 capsule (0.4 mg total) by mouth daily.  ? [DISCONTINUED] HYDROcodone bit-homatropine (HYCODAN) 5-1.5 MG/5ML syrup Take 5 mLs by mouth every 8 (eight) hours as needed for cough.  ? [DISCONTINUED] hydrOXYzine (ATARAX/VISTARIL) 10 MG tablet Take 1 tablet (10 mg total) by mouth at bedtime.  ? [DISCONTINUED] lidocaine (XYLOCAINE) 5 % ointment Apply 1 application topically as needed.  ? [DISCONTINUED] silver sulfADIAZINE (SILVADENE) 1 % cream Apply 1 application topically daily.  ? [DISCONTINUED] Tiotropium Bromide-Olodaterol (STIOLTO RESPIMAT) 2.5-2.5 MCG/ACT AERS Inhale 2 puffs into the lungs daily.  ?  ?Allergies:   Feldene [piroxicam], Neomycin-polymyxin-hc, Statins, Doxycycline, Latex, Tequin, and Valium  ? ?Social History  ? ?Tobacco Use  ? Smoking status: Never  ?  Passive exposure: Yes  ? Smokeless tobacco: Former  ?  Types: Chew  ? Tobacco comments:  ?  "quit chewing in the 1960s"  ?Vaping Use  ? Vaping Use: Never used  ?Substance Use Topics  ? Alcohol use: No  ? Drug use: No  ?  ?Family Hx: ?The patient's family history includes Arthritis-Osteo in his sister; Heart attack in his brother and another family member. There is  no history of Cancer. ? ?ROS see HPI ? ?EKGs/Labs/Other Test Reviewed:   ? ?EKG:  EKG is not ordered today.  The ekg ordered today demonstrates n/a ? ?Recent Labs: ?03/11/2021: ALT 34; Brain Natriuretic Peptide 175 ?05/29/2021: Magnesium 2.2 ?06/25/2021: Hemoglobin 10.3; NT-Pro BNP 3,088; Platelets 117 ?07/06/2021: BUN 25; Creatinine, Ser 1.41; Potassium 4.1; Sodium 134  ? ?Recent Lipid Panel ?No results for input(s): CHOL, TRIG, HDL, VLDL, LDLCALC, LDLDIRECT in the last 8760 hours.  ? ?Risk Assessment/Calculations:   ? ?CHA2DS2-VASc Score = 7  ? This indicates a 11.2% annual risk of stroke. ?The patient's score is based upon: ?CHF History: 1 ?HTN History: 1 ?Diabetes History: 0 ?Stroke History: 2 ?Vascular Disease History: 1 ?Age Score: 2 ?Gender Score: 0 ?  ? ?    ?Physical Exam:   ? ?  VS:  BP (!) 102/50   Pulse 65   Ht 5\' 3"  (1.6 m)   Wt 159 lb 12.8 oz (72.5 kg)   SpO2 99%   BMI 28.31 kg/m?    ? ?Wt Readings from Last 3 Encounters:  ?07/28/21 159 lb 12.8 oz (72.5 kg)  ?06/25/21 153 lb 6.4 oz (69.6 kg)  ?05/31/21 165 lb (74.8 kg)  ?  ?Constitutional:   ?   Appearance: Not in distress.  ?   Comments: Arrives in a wheelchair  ?Pulmonary:  ?   Effort: Pulmonary effort is normal.  ?   Breath sounds: No wheezing. No rales.  ?Cardiovascular:  ?   Normal rate. Irregularly irregular rhythm. Normal S1. Normal S2.   ?   Murmurs: There is a grade 2/6 systolic murmur at the URSB.  ?Edema: ?   Peripheral edema absent.  ?Abdominal:  ?   Palpations: Abdomen is soft.  ?Musculoskeletal:  ?   Cervical back: Neck supple. Skin: ?   General: Skin is warm and dry.  ?Neurological:  ?   General: No focal deficit present.  ?   Mental Status: Alert and oriented to person, place and time.  ?   Cranial Nerves: Cranial nerves are intact.  ?   ?    ?ASSESSMENT & PLAN:   ?HFmrEF (heart failure with mildly reduced ejection fraction) ([place]) ?EF 45-50.  Volume status seems stable with recent adjustment in Lasix.  He has felt better since stopping  the Entresto.  His BP is still somewhat low and I am hesitant to try to challenge him with low dose ACE/ARB.  He and his daughter agree and prefer to not change meds at this point.  Continue Lasix 40 mg once daily

## 2021-07-28 ENCOUNTER — Ambulatory Visit: Payer: Medicare HMO | Admitting: Physician Assistant

## 2021-07-28 ENCOUNTER — Encounter: Payer: Self-pay | Admitting: Physician Assistant

## 2021-07-28 VITALS — BP 102/50 | HR 65 | Ht 63.0 in | Wt 159.8 lb

## 2021-07-28 DIAGNOSIS — I5020 Unspecified systolic (congestive) heart failure: Secondary | ICD-10-CM | POA: Diagnosis not present

## 2021-07-28 DIAGNOSIS — I4821 Permanent atrial fibrillation: Secondary | ICD-10-CM

## 2021-07-28 DIAGNOSIS — I2510 Atherosclerotic heart disease of native coronary artery without angina pectoris: Secondary | ICD-10-CM | POA: Diagnosis not present

## 2021-07-28 DIAGNOSIS — I10 Essential (primary) hypertension: Secondary | ICD-10-CM

## 2021-07-28 NOTE — Patient Instructions (Signed)
Medication Instructions:  ?Your physician recommends that you continue on your current medications as directed. Please refer to the Current Medication list given to you today. ? ?*If you need a refill on your cardiac medications before your next appointment, please call your pharmacy* ? ? ?Lab Work: ?None ordered ? ?If you have labs (blood work) drawn today and your tests are completely normal, you will receive your results only by: ?MyChart Message (if you have MyChart) OR ?A paper copy in the mail ?If you have any lab test that is abnormal or we need to change your treatment, we will call you to review the results. ? ? ?Testing/Procedures: ?None ordered ? ? ?Follow-Up: ?At [facility], you and your health needs are our priority.  As part of our continuing mission to provide you with exceptional heart care, we have created designated Provider Care Teams.  These Care Teams include your primary Cardiologist (physician) and Advanced Practice Providers (APPs -  Physician Assistants and Nurse Practitioners) who all work together to provide you with the care you need, when you need it. ? ?We recommend signing up for the patient portal called "MyChart".  Sign up information is provided on this After Visit Summary.  MyChart is used to connect with patients for Virtual Visits (Telemedicine).  Patients are able to view lab/test results, encounter notes, upcoming appointments, etc.  Non-urgent messages can be sent to your provider as well.   ?To learn more about what you can do with MyChart, go to [email or web address].   ? ?Your next appointment:   ?11/01/21 ? ?The format for your next appointment:   ?In Person ? ?Provider:   ?Larae Grooms, MD   ? ? ?Other Instructions ? ? ?Important Information About Sugar ? ? ? ? ? ? ?

## 2021-07-28 NOTE — Assessment & Plan Note (Signed)
EF 45-50.  Volume status seems stable with recent adjustment in Lasix.  He has felt better since stopping the Entresto.  His BP is still somewhat low and I am hesitant to try to challenge him with low dose ACE/ARB.  He and his daughter agree and prefer to not change meds at this point.  Continue Lasix 40 mg once daily except 20 mg 2 days a week, metoprolol tartrate 12.5 mg once daily.  We can continue to consider ACE/ARB at f/u if BP runs higher.  F/u with Dr. Irish Lack or me in 3-4 mos.  ?

## 2021-07-28 NOTE — Assessment & Plan Note (Signed)
History of CABG in 1998 and PCI to the LCx and radial to [place] in 2008.  He had stable anatomy in 2019 by cardiac catheterization.  He is not having symptoms of angina.  Continue metoprolol tartrate 12.5 mg once daily, Crestor 5 mg once daily.   ?

## 2021-07-28 NOTE — Assessment & Plan Note (Signed)
Rate is controlled on exam.  CrCl 35.  Continue Xarelto 15 mg once daily.  ?

## 2021-08-09 ENCOUNTER — Other Ambulatory Visit (HOSPITAL_COMMUNITY): Payer: Self-pay

## 2021-08-09 ENCOUNTER — Ambulatory Visit (INDEPENDENT_AMBULATORY_CARE_PROVIDER_SITE_OTHER): Payer: Medicare HMO | Admitting: Family Medicine

## 2021-08-09 VITALS — BP 118/58 | HR 60 | Temp 98.0°F | Ht 63.0 in | Wt 150.8 lb

## 2021-08-09 DIAGNOSIS — M25511 Pain in right shoulder: Secondary | ICD-10-CM | POA: Diagnosis not present

## 2021-08-09 DIAGNOSIS — M069 Rheumatoid arthritis, unspecified: Secondary | ICD-10-CM | POA: Diagnosis not present

## 2021-08-09 MED ORDER — TRAMADOL HCL 50 MG PO TABS
50.0000 mg | ORAL_TABLET | Freq: Three times a day (TID) | ORAL | 0 refills | Status: AC | PRN
Start: 2021-08-09 — End: 2021-09-08
  Filled 2021-08-09: qty 30, 10d supply, fill #0

## 2021-08-09 MED ORDER — PREDNISONE 20 MG PO TABS
ORAL_TABLET | ORAL | 0 refills | Status: AC
  Filled 2021-08-09: qty 12, 6d supply, fill #0

## 2021-08-09 NOTE — Progress Notes (Signed)
? ? ?Subjective:  ? ? Patient ID: Evan [name], male    DOB: 04-23-1927, 88 y.o.   MRN: [id number] ? ?Patient presents today with his daughter.  He has been having pain in his right shoulder for 1 week.  The pain is brought on by movement.  He hurts with passive abduction greater than 90 degrees.  Active abduction is limited to 90 degrees however he has significant weakness in that side due to stroke.  He states that it aches and throbs from his posterior shoulder up into his neck.  He denies any pain turning his head side to side however palpation along the right border of the trapezius does elicit pain and tenderness.  He has pain with internal and external rotation of the shoulder passively.  There is no significant crepitus in the shoulder.  His daughter had avoided doing any of his range of motion exercises for a while due to breast cancer that she is currently being treated for.  She recently started doing his exercises again and after that is when his pain started in his shoulder.  He has not fallen or injured his shoulder in any way.  However Tylenol is not controlling the pain and he is unable to take NSAIDs due to the fact he is on Xarelto. ?Past Medical History:  ?Diagnosis Date  ? Adenocarcinoma of right lung ([place]) 01/06/2016  ? Anginal chest pain at rest [facility])   ? Chronic non, controlled on Lorazepam  ? Anxiety   ? Arthritis   ? "all over"   ? BPH (benign prostatic hyperplasia)   ? CAD (coronary artery disease)   ? Chronic lower back pain   ? Complication of anesthesia   ? "problems making water afterwards"  ? Depression   ? DJD (degenerative joint disease)   ? Esophageal ulcer without bleeding   ? bid ppi indefinitely  ? Family history of adverse reaction to anesthesia   ? "children w/PONV"  ? GERD (gastroesophageal reflux disease)   ? Headache   ? "couple/week maybe" (09/04/2015)  ? Hemochromatosis   ? Possible, elevated iron stores, hook-like osteophytes on hand films, normal LFTs  ? Hepatitis   ?  "yellow jaundice as a baby"  ? History of ASCVD   ? MULTIVESSEL  ? Hyperlipidemia   ? Hypertension   ? Mild aortic stenosis 06/23/2021  ? Osteoarthritis   ? Pneumonia   ? "several times; got a little now" (09/04/2015)  ? Rheumatoid arthritis ([place])   ? Subdural hematoma ([facility])   ? small after fall 09/2016-plavix held, neurosurgery consulted.  Asymptomatic.    ? 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 on Lovenox lifelong since he failed PO Eliquis  ? ?Past Surgical History:  ?Procedure Laterality Date  ? ARM SKIN LESION BIOPSY / EXCISION Left 09/02/2015  ? CATARACT EXTRACTION W/ INTRAOCULAR LENS  IMPLANT, BILATERAL Bilateral   ? CHOLECYSTECTOMY OPEN    ? CORNEAL TRANSPLANT Bilateral   ? "one at [facility]; one at [facility]"  ? CORONARY ANGIOPLASTY WITH STENT PLACEMENT    ? CORONARY ARTERY BYPASS GRAFT  1999  ? DESCENDING AORTIC ANEURYSM REPAIR W/ STENT    ? DE stent ostium into the right radial free graft at OM-, 02-2007  ? ESOPHAGOGASTRODUODENOSCOPY N/A 11/09/2014  ? Procedure: ESOPHAGOGASTRODUODENOSCOPY (EGD);  Surgeon: [name], MD;  Location: [facility] ENDOSCOPY;  Service: Endoscopy;  Laterality: N/A;  ? INGUINAL HERNIA REPAIR    ? JOINT REPLACEMENT    ? LEFT HEART CATH AND CORS/GRAFTS ANGIOGRAPHY  N/A 06/27/2017  ? Procedure: LEFT HEART CATH AND CORS/GRAFTS ANGIOGRAPHY;  Surgeon: [name], MD;  Location: [facility];  Service: Cardiovascular;  Laterality: N/A;  ? NASAL SINUS SURGERY    ? SHOULDER OPEN ROTATOR CUFF REPAIR Right   ? TOTAL KNEE ARTHROPLASTY Bilateral   ? ?Current Outpatient Medications on File Prior to Visit  ?Medication Sig Dispense Refill  ? acetaminophen (TYLENOL) 325 MG tablet Take 1-2 tablets (325-650 mg total) by mouth every 4 (four) hours as needed for mild pain.    ? ALPRAZolam (XANAX) 0.5 MG tablet Take 0.5 tablets (0.25 mg total) by mouth 3 (three) times daily as needed for anxiety. 30 tablet 0  ? FLUoxetine (PROZAC) 20 MG capsule Take 1 capsule (20 mg total) by mouth daily. 90 capsule 3  ? furosemide  (LASIX) 40 MG tablet Take 1 tablet (40 mg total) by mouth daily. 90 tablet 3  ? magnesium oxide (MAG-OX) 400 MG tablet Take 400 mg by mouth 2 (two) times daily.    ? metoprolol tartrate (LOPRESSOR) 25 MG tablet Take 12.5 mg by mouth daily.    ? nitroGLYCERIN (NITROSTAT) 0.4 MG SL tablet Place 1 tablet (0.4 mg total) under the tongue every 5 (five) minutes as needed for chest pain. 25 tablet 2  ? pantoprazole (PROTONIX) 40 MG tablet TAKE 1 TABLET EVERY DAY 90 tablet 3  ? polyethylene glycol (MIRALAX / GLYCOLAX) 17 g packet Take 17 g by mouth daily as needed for mild constipation. 14 each 0  ? Rivaroxaban (XARELTO) 15 MG TABS tablet TAKE 1 TABLET BY MOUTH DAILY WITH SUPPER. 90 tablet 1  ? rosuvastatin (CRESTOR) 5 MG tablet TAKE 1 TABLET EVERY DAY 90 tablet 1  ? tacrolimus (PROTOPIC) 0.1 % ointment Apply 1 application topically 2 (two) times daily. 100 g 3  ? tamsulosin (FLOMAX) 0.4 MG CAPS capsule Take 1 capsule (0.4 mg total) by mouth daily. 90 capsule 3  ? ?No current facility-administered medications on file prior to visit.  ? ?Allergies  ?Allergen Reactions  ? Feldene [Piroxicam] Other (See Comments)  ?  REACTION: Blisters  ? Neomycin-Polymyxin-Hc Itching  ? Statins Other (See Comments)  ?  Myalgias  ? Doxycycline Other (See Comments)  ?  REACTION:  unknown  ? Latex Rash  ? Tequin Anxiety  ? Valium Other (See Comments)  ?  REACTION:  unknown  ? ?Social History  ? ?Socioeconomic History  ? Marital status: Widowed  ?  Spouse name: Not on file  ? Number of children: Not on file  ? Years of education: Not on file  ? Highest education level: Not on file  ?Occupational History  ? Not on file  ?Tobacco Use  ? Smoking status: Never  ?  Passive exposure: Yes  ? Smokeless tobacco: Former  ?  Types: Chew  ? Tobacco comments:  ?  "quit chewing in the 1960s"  ?Vaping Use  ? Vaping Use: Never used  ?Substance and Sexual Activity  ? Alcohol use: No  ? Drug use: No  ? Sexual activity: Never  ?Other Topics Concern  ? Not on file   ?Social History Narrative  ? Not on file  ? ?Social Determinants of Health  ? ?Financial Resource Strain: Low Risk   ? Difficulty of Paying Living Expenses: Not hard at all  ?Food Insecurity: No Food Insecurity  ? Worried About Charity fundraiser in the Last Year: Never true  ? Ran Out of Food in the Last Year:  Never true  ?Transportation Needs: No Transportation Needs  ? Lack of Transportation (Medical): No  ? Lack of Transportation (Non-Medical): No  ?Physical Activity: Insufficiently Active  ? Days of Exercise per Week: 3 days  ? Minutes of Exercise per Session: 20 min  ?Stress: No Stress Concern Present  ? Feeling of Stress : Not at all  ?Social Connections: Socially Isolated  ? Frequency of Communication with Friends and Family: More than three times a week  ? Frequency of Social Gatherings with Friends and Family: More than three times a week  ? Attends Religious Services: Never  ? Active Member of Clubs or Organizations: No  ? Attends Archivist Meetings: Never  ? Marital Status: Widowed  ?Intimate Partner Violence: Not At Risk  ? Fear of Current or Ex-Partner: No  ? Emotionally Abused: No  ? Physically Abused: No  ? Sexually Abused: No  ? ? ? ? ?Review of Systems  ?Respiratory:  Positive for cough and shortness of breath.   ? ?   ?Objective:  ? Physical Exam ?Vitals reviewed.  ?Constitutional:   ?   General: He is not in acute distress. ?   Appearance: Normal appearance. He is not toxic-appearing or diaphoretic.  ?Cardiovascular:  ?   Rate and Rhythm: Normal rate and regular rhythm.  ?   Heart sounds: Normal heart sounds. No murmur heard. ?  No friction rub. No gallop.  ?Pulmonary:  ?   Effort: Pulmonary effort is normal. No respiratory distress.  ?   Breath sounds: Normal breath sounds. No wheezing, rhonchi or rales.  ?Musculoskeletal:  ?   Right shoulder: Tenderness present. No swelling, deformity, effusion, laceration, bony tenderness or crepitus. Decreased range of motion. Decreased strength.   ?   Right lower leg: No edema.  ?   Left lower leg: No edema.  ?Neurological:  ?   Mental Status: He is alert.  ?Right-sided hemiparesis with weakness in his right leg.  Weakness with knee flexion and extension

## 2021-08-12 ENCOUNTER — Ambulatory Visit: Payer: Medicare HMO

## 2021-08-12 ENCOUNTER — Other Ambulatory Visit: Payer: Self-pay | Admitting: Family Medicine

## 2021-08-12 NOTE — Telephone Encounter (Signed)
Requested Prescriptions  ?Pending Prescriptions Disp Refills  ?? FLUoxetine (PROZAC) 20 MG capsule [Pharmacy Med Name: FLUOXETINE HYDROCHLORIDE 20 MG Capsule] 90 capsule 3  ?  Sig: TAKE 1 CAPSULE EVERY DAY  ?  ? Psychiatry:  Antidepressants - SSRI Passed - 08/12/2021  2:45 AM  ?  ?  Passed - Completed PHQ-2 or PHQ-9 in the last 360 days  ?  ?  Passed - Valid encounter within last 6 months  ?  Recent Outpatient Visits   ?      ? 3 days ago Acute pain of right shoulder  ? [facility] [place], Cammie Mcgee, MD  ? 1 month ago Dysuria  ? [facility] [place], Cammie Mcgee, MD  ? 2 months ago Acute systolic congestive heart failure ([place])  ? [facility] [place], Cammie Mcgee, MD  ? 3 months ago Acute cough  ? [place], NP  ? 4 months ago Need for immunization against influenza  ? [facility] [place], Cammie Mcgee, MD  ?  ?  ?Future Appointments   ?        ? In 2 months Jettie Booze, MD [place], [facility]  ?  ? ?  ?  ?  ? ? ?

## 2021-08-13 DIAGNOSIS — M21371 Foot drop, right foot: Secondary | ICD-10-CM | POA: Diagnosis not present

## 2021-08-16 ENCOUNTER — Other Ambulatory Visit: Payer: Self-pay | Admitting: Interventional Cardiology

## 2021-08-20 ENCOUNTER — Other Ambulatory Visit: Payer: Self-pay | Admitting: Family Medicine

## 2021-08-20 DIAGNOSIS — R3915 Urgency of urination: Secondary | ICD-10-CM | POA: Diagnosis not present

## 2021-08-20 NOTE — Telephone Encounter (Signed)
Requested medications are due for refill today.  unsure  Requested medications are on the active medications list.  yes  Last refill. 06/25/2021 -unknown quantity  Future visit scheduled.   no  Notes to clinic.  Medication listed as historical from historical provider.    Requested Prescriptions  Pending Prescriptions Disp Refills   metoprolol tartrate (LOPRESSOR) 25 MG tablet [Pharmacy Med Name: METOPROLOL TARTRATE 25 MG Tablet] 90 tablet     Sig: TAKE 1/2 TABLET TWICE DAILY     Cardiovascular:  Beta Blockers Passed - 08/20/2021  3:06 PM      Passed - Last BP in normal range    BP Readings from Last 1 Encounters:  08/09/21 (!) 118/58         Passed - Last Heart Rate in normal range    Pulse Readings from Last 1 Encounters:  08/09/21 60         Passed - Valid encounter within last 6 months    Recent Outpatient Visits           1 week ago Acute pain of right shoulder   [place] [facility] [place], Cammie Mcgee, MD   1 month ago [place] [name], MD   2 months ago Acute systolic congestive heart failure ([place])   [place] [name], MD   3 months ago Acute cough   [place] Eulogio Bear, NP   4 months ago Need for immunization against influenza   [place], Cammie Mcgee, MD       Future Appointments             In 2 months [place], Charlann Lange, MD [place], [facility]

## 2021-08-23 ENCOUNTER — Other Ambulatory Visit: Payer: Self-pay

## 2021-09-02 ENCOUNTER — Ambulatory Visit: Payer: Medicare HMO | Admitting: Pharmacist

## 2021-09-02 DIAGNOSIS — J438 Other emphysema: Secondary | ICD-10-CM

## 2021-09-02 DIAGNOSIS — Z8673 Personal history of transient ischemic attack (TIA), and cerebral infarction without residual deficits: Secondary | ICD-10-CM

## 2021-09-02 NOTE — Progress Notes (Unsigned)
Chronic Care Management Pharmacy Note  09/03/2021 Name:  Evan [name] MRN:  [id number] DOB:  1927-11-04  Summary: Initial visit with PharmD.  Is having some SOB, no maintenance inhaler.  Reports sleep is sometimes difficult, however does not like to take his alprazolam due to next day effects.  Recommendations/Changes made from today's visit: None at this time - monitor  Plan: FU 6 months   Subjective: Evan [name] is an 88 y.o. year old male who is a primary patient of Pickard, Cammie Mcgee, MD.  The CCM team was consulted for assistance with disease management and care coordination needs.    Engaged with patient by telephone for initial visit in response to provider referral for pharmacy case management and/or care coordination services.   Consent to Services:  The patient was given the following information about Chronic Care Management services today, agreed to services, and gave verbal consent: 1. CCM service includes personalized support from designated clinical staff supervised by the primary care provider, including individualized plan of care and coordination with other care providers 2. 24/7 contact phone numbers for assistance for urgent and routine care needs. 3. Service will only be billed when office clinical staff spend 20 minutes or more in a month to coordinate care. 4. Only one practitioner may furnish and bill the service in a calendar month. 5.The patient may stop CCM services at any time (effective at the end of the month) by phone call to the office staff. 6. The patient will be responsible for cost sharing (co-pay) of up to 20% of the service fee (after annual deductible is met). Patient agreed to services and consent obtained.  Patient Care Team: [name], MD as PCP - General (Family Medicine) Jettie Booze, MD as PCP - Cardiology (Cardiology) Edythe Clarity, [facility] as Pharmacist (Pharmacist)  Recent office visits:  05/31/21 [name], MD -  Family Medicine - Systolic congestive heart failue - Labs were ordered. Patient received IV fluids via ER 90 05/28/21. No medication changes. Follow up if no improvement.    Recent consult visits:  None noted.      Hospital visits: 05/29/21 Medication Reconciliation was completed by comparing discharge summary, patient's EMR and Pharmacy list, and upon discussion with patient.   Admitted to the hospital on 05/29/21 due to Dehydration. Discharge date was 05/29/21. Discharged from [place]?Medications Started at [facility] Discharge:?? None noted.   Medication Changes at Hospital Discharge: None noted.   Medications Discontinued at Hospital Discharge: None noted.    Medications that remain the same after Hospital Discharge:??  All other medications will remain the same.   Objective:  Lab Results  Component Value Date   CREATININE 1.41 (H) 07/06/2021   BUN 25 07/06/2021   GFR 56.40 (L) 06/21/2017   GFRNONAA 37 (L) 05/29/2021   GFRAA 54 (L) 06/22/2020   NA 134 07/06/2021   K 4.1 07/06/2021   CALCIUM 8.9 07/06/2021   CO2 18 (L) 07/06/2021   GLUCOSE 161 (H) 07/06/2021    Lab Results  Component Value Date/Time   HGBA1C 5.3 12/02/2016 04:32 PM   GFR 56.40 (L) 06/21/2017 04:17 PM   GFR 63.82 06/14/2017 10:10 AM    Last diabetic Eye exam: No results found for: HMDIABEYEEXA  Last diabetic Foot exam: No results found for: HMDIABFOOTEX   Lab Results  Component Value Date   CHOL 91 (L) 06/22/2020   HDL 35 (L) 06/22/2020   LDLCALC 41 06/22/2020  TRIG 66 06/22/2020   CHOLHDL 2.6 06/22/2020       Latest Ref Rng & Units 03/11/2021   11:25 AM 06/22/2020    9:53 AM 03/13/2020    9:31 AM  Hepatic Function  Total Protein 6.1 - 8.1 g/dL 6.1   6.4   6.2    Albumin 3.5 - 4.6 g/dL  3.9     AST 10 - 35 U/L 30   19   12     ALT 9 - 46 U/L 34   10   6    Alk Phosphatase 44 - 121 IU/L  132     Total Bilirubin 0.2 - 1.2 mg/dL 0.6   0.7   0.8      Lab Results   Component Value Date/Time   TSH 2.850 06/22/2020 09:53 AM   TSH 1.378 08/23/2018 02:36 AM   TSH 1.61 01/17/2017 11:15 AM   FREET4 0.76 12/20/2016 04:51 PM       Latest Ref Rng & Units 06/25/2021    9:11 AM 05/29/2021    3:35 PM 03/11/2021   11:25 AM  CBC  WBC 3.4 - 10.8 x10E3/uL 6.4   7.3   9.7    Hemoglobin 13.0 - 17.7 g/dL 10.3   10.9   10.9    Hematocrit 37.5 - 51.0 % 31.2   33.7   32.4    Platelets 150 - 450 x10E3/uL 117   125   147      Lab Results  Component Value Date/Time   VD25OH 27 (L) 09/21/2015 09:40 AM    Clinical ASCVD: Yes  The ASCVD Risk score (Arnett DK, et al., 2019) failed to calculate for the following reasons:   The 2019 ASCVD risk score is only valid for ages 53 to 32   The patient has a prior MI or stroke diagnosis       05/07/2021   11:58 AM 07/06/2020   11:09 AM 07/04/2019   10:31 AM  Depression screen PHQ 2/9  Decreased Interest 0 0 1  Down, Depressed, Hopeless 0 0 1  PHQ - 2 Score 0 0 2     Social History   Tobacco Use  Smoking Status Never   Passive exposure: Yes  Smokeless Tobacco Former   Types: Chew  Tobacco Comments   "quit chewing in the 1960s"   BP Readings from Last 3 Encounters:  08/09/21 (!) 118/58  07/28/21 (!) 102/50  07/08/21 120/80   Pulse Readings from Last 3 Encounters:  08/09/21 60  07/28/21 65  07/08/21 82   Wt Readings from Last 3 Encounters:  08/09/21 150 lb 12.8 oz (68.4 kg)  07/28/21 159 lb 12.8 oz (72.5 kg)  06/25/21 153 lb 6.4 oz (69.6 kg)   BMI Readings from Last 3 Encounters:  08/09/21 26.71 kg/m  07/28/21 28.31 kg/m  06/25/21 27.17 kg/m    Assessment/Interventions: Review of patient past medical history, allergies, medications, health status, including review of consultants reports, laboratory and other test data, was performed as part of comprehensive evaluation and provision of chronic care management services.   SDOH:  (Social Determinants of Health) assessments and interventions performed:  Yes  Financial Resource Strain: Low Risk    Difficulty of Paying Living Expenses: Not hard at all    SDOH Screenings   Alcohol Screen: Low Risk    Last Alcohol Screening Score (AUDIT): 0  Depression (PHQ2-9): Low Risk    PHQ-2 Score: 0  Financial Resource Strain: Low Risk  Difficulty of Paying Living Expenses: Not hard at all  Food Insecurity: No Food Insecurity   Worried About Charity fundraiser in the Last Year: Never true   Ran Out of Food in the Last Year: Never true  Housing: Low Risk    Last Housing Risk Score: 0  Physical Activity: Insufficiently Active   Days of Exercise per Week: 3 days   Minutes of Exercise per Session: 20 min  Social Connections: Socially Isolated   Frequency of Communication with Friends and Family: More than three times a week   Frequency of Social Gatherings with Friends and Family: More than three times a week   Attends Religious Services: Never   Marine scientist or Organizations: No   Attends Archivist Meetings: Never   Marital Status: Widowed  Stress: No Stress Concern Present   Feeling of Stress : Not at all  Tobacco Use: Medium Risk   Smoking Tobacco Use: Never   Smokeless Tobacco Use: Former   Passive Exposure: Yes  Transportation Needs: No Data processing manager (Medical): No   Lack of Transportation (Non-Medical): No    CCM Care Plan  Allergies  Allergen Reactions   Feldene [Piroxicam] Other (See Comments)    REACTION: Blisters   Neomycin-Polymyxin-Hc Itching   Statins Other (See Comments)    Myalgias   Doxycycline Other (See Comments)    REACTION:  unknown   Latex Rash   Tequin Anxiety   Valium Other (See Comments)    REACTION:  unknown    Medications Reviewed Today     Reviewed by Edythe Clarity, [facility] (Pharmacist) on 09/03/21 at 1407  Med List Status: <None>   Medication Order Taking? Sig Documenting Provider Last Dose Status Informant  acetaminophen (TYLENOL) 325 MG  tablet [id number] Yes Take 1-2 tablets (325-650 mg total) by mouth every 4 (four) hours as needed for mild pain. Bary Leriche, PA-C Taking Active Child  ALPRAZolam Duanne Moron) 0.5 MG tablet [id number] Yes Take 0.5 tablets (0.25 mg total) by mouth 3 (three) times daily as needed for anxiety. [name], MD Taking Active Child  FLUoxetine (PROZAC) 20 MG capsule [id number] Yes TAKE 1 CAPSULE EVERY DAY [name], MD Taking Active   furosemide (LASIX) 40 MG tablet [id number] Yes Take 1 tablet (40 mg total) by mouth daily. [name], PA-C Taking Active   magnesium oxide (MAG-OX) 400 MG tablet [id number] Yes Take 400 mg by mouth 2 (two) times daily. [provider] Taking Active   metoprolol tartrate (LOPRESSOR) 25 MG tablet [id number] Yes TAKE 1/2 TABLET TWICE DAILY Pickard, Cammie Mcgee, MD Taking Active   nitroGLYCERIN (NITROSTAT) 0.4 MG SL tablet [id number] Yes Place 1 tablet (0.4 mg total) under the tongue every 5 (five) minutes as needed for chest pain. [name], MD Taking Active   pantoprazole (PROTONIX) 40 MG tablet [id number] Yes TAKE 1 TABLET EVERY DAY Pickard, Cammie Mcgee, MD Taking Active   polyethylene glycol (MIRALAX / GLYCOLAX) 17 g packet [id number] Yes Take 17 g by mouth daily as needed for mild constipation. Cathlyn Parsons, PA-C Taking Active Child  Rivaroxaban (XARELTO) 15 MG TABS tablet [id number] Yes TAKE 1 TABLET BY MOUTH DAILY WITH SUPPER. Jettie Booze, MD Taking Active   rosuvastatin (CRESTOR) 5 MG tablet [id number] Yes TAKE 1 TABLET EVERY DAY [name], PA-C Taking Active   tacrolimus (PROTOPIC) 0.1 % ointment [id number] Yes Apply 1 application topically 2 (two) times daily.  [name], MD Taking Active   tamsulosin [facility]) 0.4 MG CAPS capsule [id number] Yes Take 1 capsule (0.4 mg total) by mouth daily. [name], MD Taking Active   traMADol Veatrice Bourbon) 50 MG tablet [id number] Yes Take 1 tablet (50 mg total) by mouth every 8 (eight) hours  as needed. [name], MD Taking Active             Patient Active Problem List   Diagnosis Date Noted   Hemiparesis due to old stroke ([place]) 06/25/2021   Mild aortic stenosis 06/23/2021   Rib pain on right side 03/19/2020   Sundowning 07/04/2019   Bilateral hearing loss 04/16/2019   Urinary frequency 03/21/2019   Closed wedge fracture of lumbar vertebra ([place]) 03/14/2019   Chronic right shoulder pain 12/19/2018   Weakness generalized 09/28/2018   Palliative care encounter 09/28/2018   Pressure injury of skin 08/27/2018   COPD (chronic obstructive pulmonary disease) ([place]) 08/26/2018   AMS (altered mental status) 08/22/2018   Altered mental status 08/21/2018   UTI (urinary tract infection) 08/21/2018   Abnormality of gait 08/20/2018   Prolonged Q-T interval on ECG    Dysphagia, post-stroke    Supplemental oxygen dependent    Anxiety state    Fatigue    SOB (shortness of breath)    Hyperglycemia    History of lung cancer    Depression    Stage 3a chronic kidney disease ([place])    Acute blood loss anemia    Fall 07/16/2018   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   Acute spontaneous 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 ([place]) L thalamic 07/14/2018   Cellulitis of arm, right 06/01/2018   Rash and nonspecific skin eruption 06/01/2018   Chest pain 02/02/2018   Thrombocytopenia ([place]) 02/02/2018   Hypokalemia 02/02/2018   Pulmonary embolism ([place]) 02/02/2018   Elevated troponin 02/02/2018   Old MI (myocardial infarction) 06/27/2017   HFmrEF (heart failure with mildly reduced ejection fraction) ([place]) 06/26/2017   Dyspnea 06/21/2017   Acute bronchitis 05/15/2017   Oral candidiasis 05/15/2017   History of pericarditis    Chest pain at rest 12/20/2016   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   Pleurisy 10/11/2016   Chest wall pain 10/11/2016   Subdural hematoma ([place]) 09/13/2016   Permanent atrial  fibrillation ([place]) 05/21/2016   Adenocarcinoma of right lung ([place]) 01/06/2016   GAD (generalized anxiety disorder) 11/20/2015   Hypotension    Cellulitis 09/04/2015   Cellulitis of left axilla    Esophageal ulcer without bleeding    Bilateral lower extremity edema 04/28/2014   Coronary artery disease involving native coronary artery of native heart without angina pectoris 09/09/2013   BPH (benign prostatic hyperplasia) 10/08/2012   Anxiety    EKG abnormalities 09/05/2012   Tremor 09/05/2012   Chronic fatigue 09/05/2012   Arthritis    Asthma, chronic    Reflux esophagitis    S/P CABG (coronary artery bypass graft)    GERD (gastroesophageal reflux disease)    Hyperlipidemia LDL goal <70    Essential hypertension    UNSPECIFIED PERIPHERAL VASCULAR DISEASE 06/16/2009    Immunization History  Administered Date(s) Administered   Fluad Quad(high Dose 65+) 01/04/2019, 01/23/2020, 04/08/2021   Influenza Whole 01/26/2007, 01/10/2008, 12/30/2009   Influenza, High Dose Seasonal PF 12/23/2016, 02/09/2018   Influenza,inj,Quad PF,6+ Mos 12/18/2012, 01/16/2015, 12/29/2015   PFIZER(Purple Top)SARS-COV-2 Vaccination 05/10/2019, 06/04/2019, 02/05/2020   Pneumococcal Conjugate-13 06/24/2014   Pneumococcal Polysaccharide-23 01/25/2000   Td 12/27/2000,  03/09/2010   Tdap 03/09/2010    Conditions to be addressed/monitored:  Htn, Afib, CHF, Hx of PE, Asthma/COPD, GERD, HLD, GAD/Depression  Care Plan : General Pharmacy (Adult)  Updates made by Edythe Clarity, RPH since 09/03/2021 12:00 AM     Problem: Htn, Afib, CHF, Hx of PE, Asthma/COPD, GERD, HLD, GAD/Depression   Priority: High  Onset Date: 01/28/2021     Long-Range Goal: Patient-Specific Goal   Start Date: 01/28/2021  Expected End Date: 08/01/2021  Recent Progress: On track  Priority: High  Note:   Current Barriers:  Sleep Possible untreated COPD  Pharmacist Clinical Goal(s):  Patient will maintain control of BP as evidenced  by home monitoring  through collaboration with PharmD and provider.   Interventions: 1:1 collaboration with [name], MD regarding development and update of comprehensive plan of care as evidenced by provider attestation and co-signature Inter-disciplinary care team collaboration (see longitudinal plan of care) Comprehensive medication review performed; medication list updated in electronic medical record  Hypertension (BP goal <130/80) -Controlled -Current treatment: Metoprolol 21m one-half tablet once daily -Medications previously tried: atenolol  -Current home readings: 110/58 40-70s -Current exercise habits: minimal -Denies hypotensive/hypertensive symptoms -Educated on BP goals and benefits of medications for prevention of heart attack, stroke and kidney damage; Daily salt intake goal < 2300 mg; Importance of home blood pressure monitoring; Symptoms of hypotension and importance of maintaining adequate hydration; -Counseled to monitor BP at home weekly, document, and provide log at future appointments Home BP reported as controlled. -Recommended to continue current medication Continue to monitor at home  Hyperlipidemia: (LDL goal < 70 -Controlled -Current treatment: Rosuvastatin 530mdaily - PM -Medications previously tried: none noted  -Current exercise habits: minimal -Educated on Cholesterol goals;  Benefits of statin for ASCVD risk reduction; Importance of limiting foods high in cholesterol; -Most recent LDL is normal -Recommended to continue current medication  Atrial Fibrillation (Goal: prevent stroke and major bleeding) -Controlled -Current treatment: Rate control: Metoprolol tartrate 257mne half tablet once daily  Appropriate, Effective, Safe, Accessible Anticoagulation: Xarelto 71m63mily  Appropriate, Effective, Safe, Accessible -Medications previously tried: none noted -Home BP and HR readings: "controlled'  -Counseled on increased risk of stroke  due to Afib and benefits of anticoagulation for stroke prevention; bleeding risk associated with Xarelto and importance of self-monitoring for signs/symptoms of bleeding; -No abnormal bleeding or bruising -Recommended to continue current medication  Update 09/03/21 Does mention Xarelto is costing them over $100 per month.  Possible they may could qualify for the [place] [facility] where the copay would be max $85 per month. Will assess finances and try to help them get help with copay. NO other concerns at this time, no abnormal bleeding. Continue current meds.  Heart Failure (Goal: manage symptoms and prevent exacerbations) -Controlled -Last ejection fraction: 60-65%  -HF type: Diastolic -Current treatment: Furosemide 20mg70mly Appropriate, Effective, Safe, Accessible Metoprolol tartrate 25mg 28mhalf tablet po bid  Appropriate, Effective, Safe, Accessible -Medications previously tried: none noted  -Denies any recent swelling that does not resolve -Educated on Benefits of medications for managing symptoms and prolonging life Importance of weighing daily; if you gain more than 3 pounds in one day or 5 pounds in one week, contact providers -Recommended to continue current medication  Update 09/02/21 Continuing to monitor weights, swelling. Denies any swelling at this time. She was giving him 40mg o61msix but had to reduce to 20mg du20m some dehydration. No other concerns at this time, continue to monitor  fluid retention, limit salt intake. No med changes. COPD (Goal: control symptoms and prevent exacerbations) -Not ideally controlled -Current treatment  None currently -Medications previously tried: Ventolin, Advair  -Pulmonary function testing: Pulmonary Functions Testing Results:  TLC  Date Value Ref Range Status  01/05/2016 5.47 L Final  -Exacerbations requiring treatment in last 6 months: no -Patient denies consistent use of maintenance inhaler -Frequency of rescue inhaler  use: none -Counseled on Benefits of consistent maintenance inhaler use Differences between maintenance and rescue inhalers Patient may benefit from maintenance inhaler if SOB does not improve. -Continue current management for now.  Depression/Anxiety (Goal: Reduce Symptoms) -Controlled -Current treatment: Fluoxetine 29m daily Alprazolam 0.544mone-half tid prn - not taking at this time -Medications previously tried/failed: Abilify, lorazepam, Effexor -PHQ9:  PHQ9 SCORE ONLY 07/06/2020 07/04/2019 08/20/2018  PHQ-9 Total Score 0 2 8  -Educated on Benefits of medication for symptom control -Does sometimes have difficulty sleeping, does not like Xanax because it makes him drowsy the whole next day -Recommended to continue current medication Consider something like trazodone if sleep continues to be an issue.  Patient Goals/Self-Care Activities Patient will:  - take medications as prescribed as evidenced by patient report and record review focus on medication adherence by pill box check blood pressure weekly, document, and provide at future appointments  Follow Up Plan: The care management team will reach out to the patient again over the next 180 days.           Medication Assistance: None required.  Patient affirms current coverage meets needs.  Compliance/Adherence/Medication fill history: Care Gaps: Shingrix   Patient's preferred pharmacy is:  [id number]: [phone]: [phone]: [phone]: [phone] Uses pill box? Yes Pt endorses 100% compliance  We discussed: Benefits of medication synchronization, packaging and delivery as well as enhanced pharmacist oversight with [employer]. Patient decided to: Continue current medication management strategy  Care Plan and  Follow Up Patient Decision:  Patient agrees to Care Plan and Follow-up.  Plan: The care management team will reach out to the patient again over the next 180 days.  ChBeverly MilchPharmD Clinical Pharmacist [phone]

## 2021-09-03 NOTE — Patient Instructions (Addendum)
Visit Information   Goals Addressed             This Visit's Progress    Track and Manage Fluids and Swelling-Heart Failure   On track    Timeframe:  Long-Range Goal Priority:  High Start Date: 02/01/21                            Expected End Date:04/31/23                       Follow Up Date 05/04/21    - call office if I gain more than 2 pounds in one day or 5 pounds in one week - keep legs up while sitting - use salt in moderation - watch for swelling in feet, ankles and legs every day    Why is this important?   It is important to check your weight daily and watch how much salt and liquids you have.  It will help you to manage your heart failure.    Notes:        Patient Care Plan: General Pharmacy (Adult)     Problem Identified: Htn, Afib, CHF, Hx of PE, Asthma/COPD, GERD, HLD, GAD/Depression   Priority: High  Onset Date: 01/28/2021     Long-Range Goal: Patient-Specific Goal   Start Date: 01/28/2021  Expected End Date: 08/01/2021  Recent Progress: On track  Priority: High  Note:   Current Barriers:  Sleep Possible untreated COPD  Pharmacist Clinical Goal(s):  Patient will maintain control of BP as evidenced by home monitoring  through collaboration with PharmD and provider.   Interventions: 1:1 collaboration with [name], MD regarding development and update of comprehensive plan of care as evidenced by provider attestation and co-signature Inter-disciplinary care team collaboration (see longitudinal plan of care) Comprehensive medication review performed; medication list updated in electronic medical record  Hypertension (BP goal <130/80) -Controlled -Current treatment: Metoprolol 25mg  one-half tablet once daily -Medications previously tried: atenolol  -Current home readings: 110/58 40-70s -Current exercise habits: minimal -Denies hypotensive/hypertensive symptoms -Educated on BP goals and benefits of medications for prevention of heart  attack, stroke and kidney damage; Daily salt intake goal < 2300 mg; Importance of home blood pressure monitoring; Symptoms of hypotension and importance of maintaining adequate hydration; -Counseled to monitor BP at home weekly, document, and provide log at future appointments Home BP reported as controlled. -Recommended to continue current medication Continue to monitor at home  Hyperlipidemia: (LDL goal < 70 -Controlled -Current treatment: Rosuvastatin 5mg  daily - PM -Medications previously tried: none noted  -Current exercise habits: minimal -Educated on Cholesterol goals;  Benefits of statin for ASCVD risk reduction; Importance of limiting foods high in cholesterol; -Most recent LDL is normal -Recommended to continue current medication  Atrial Fibrillation (Goal: prevent stroke and major bleeding) -Controlled -Current treatment: Rate control: Metoprolol tartrate 25mg  one half tablet once daily  Appropriate, Effective, Safe, Accessible Anticoagulation: Xarelto 15mg  daily  Appropriate, Effective, Safe, Accessible -Medications previously tried: none noted -Home BP and HR readings: "controlled'  -Counseled on increased risk of stroke due to Afib and benefits of anticoagulation for stroke prevention; bleeding risk associated with Xarelto and importance of self-monitoring for signs/symptoms of bleeding; -No abnormal bleeding or bruising -Recommended to continue current medication  Update 09/03/21 Does mention Xarelto is costing them over $100 per month.  Possible they may could qualify for the [place] program where the copay would be max $  85 per month. Will assess finances and try to help them get help with copay. NO other concerns at this time, no abnormal bleeding. Continue current meds.  Heart Failure (Goal: manage symptoms and prevent exacerbations) -Controlled -Last ejection fraction: 60-65%  -HF type: Diastolic -Current treatment: Furosemide 20mg  daily Appropriate,  Effective, Safe, Accessible Metoprolol tartrate 25mg  one half tablet po bid  Appropriate, Effective, Safe, Accessible -Medications previously tried: none noted  -Denies any recent swelling that does not resolve -Educated on Benefits of medications for managing symptoms and prolonging life Importance of weighing daily; if you gain more than 3 pounds in one day or 5 pounds in one week, contact providers -Recommended to continue current medication  Update 09/02/21 Continuing to monitor weights, swelling. Denies any swelling at this time. She was giving him 40mg  of Lasix but had to reduce to 20mg  due to some dehydration. No other concerns at this time, continue to monitor fluid retention, limit salt intake. No med changes. COPD (Goal: control symptoms and prevent exacerbations) -Not ideally controlled -Current treatment  None currently -Medications previously tried: Ventolin, Advair  -Pulmonary function testing: Pulmonary Functions Testing Results:  TLC  Date Value Ref Range Status  01/05/2016 5.47 L Final  -Exacerbations requiring treatment in last 6 months: no -Patient denies consistent use of maintenance inhaler -Frequency of rescue inhaler use: none -Counseled on Benefits of consistent maintenance inhaler use Differences between maintenance and rescue inhalers Patient may benefit from maintenance inhaler if SOB does not improve. -Continue current management for now.  Depression/Anxiety (Goal: Reduce Symptoms) -Controlled -Current treatment: Fluoxetine 20mg  daily Alprazolam 0.5mg  one-half tid prn - not taking at this time -Medications previously tried/failed: Abilify, lorazepam, Effexor -PHQ9:  PHQ9 SCORE ONLY 07/06/2020 07/04/2019 08/20/2018  PHQ-9 Total Score 0 2 8  -Educated on Benefits of medication for symptom control -Does sometimes have difficulty sleeping, does not like Xanax because it makes him drowsy the whole next day -Recommended to continue current  medication Consider something like trazodone if sleep continues to be an issue.  Patient Goals/Self-Care Activities Patient will:  - take medications as prescribed as evidenced by patient report and record review focus on medication adherence by pill box check blood pressure weekly, document, and provide at future appointments  Follow Up Plan: The care management team will reach out to the patient again over the next 180 days.          The patient verbalized understanding of instructions, educational materials, and care plan provided today and DECLINED offer to receive copy of patient instructions, educational materials, and care plan.  Telephone follow up appointment with pharmacy team member scheduled for: 6 months  Edythe Clarity, [place], PharmD, [place] Clinical Pharmacist Practitioner [place] [phone]

## 2021-09-17 DIAGNOSIS — R3915 Urgency of urination: Secondary | ICD-10-CM | POA: Diagnosis not present

## 2021-10-01 ENCOUNTER — Other Ambulatory Visit (HOSPITAL_COMMUNITY): Payer: Self-pay

## 2021-10-15 DIAGNOSIS — R3915 Urgency of urination: Secondary | ICD-10-CM | POA: Diagnosis not present

## 2021-11-01 ENCOUNTER — Ambulatory Visit: Payer: Medicare HMO | Admitting: Interventional Cardiology

## 2021-11-01 ENCOUNTER — Encounter: Payer: Self-pay | Admitting: Interventional Cardiology

## 2021-11-01 ENCOUNTER — Ambulatory Visit: Payer: Medicare HMO | Admitting: Pulmonary Disease

## 2021-11-01 ENCOUNTER — Encounter: Payer: Self-pay | Admitting: Pulmonary Disease

## 2021-11-01 VITALS — BP 118/64 | HR 60 | Temp 98.5°F | Ht 63.0 in | Wt 157.0 lb

## 2021-11-01 VITALS — BP 110/68 | HR 57 | Ht 63.0 in | Wt 157.0 lb

## 2021-11-01 DIAGNOSIS — Z79899 Other long term (current) drug therapy: Secondary | ICD-10-CM

## 2021-11-01 DIAGNOSIS — I4821 Permanent atrial fibrillation: Secondary | ICD-10-CM | POA: Diagnosis not present

## 2021-11-01 DIAGNOSIS — T17908S Unspecified foreign body in respiratory tract, part unspecified causing other injury, sequela: Secondary | ICD-10-CM

## 2021-11-01 DIAGNOSIS — M069 Rheumatoid arthritis, unspecified: Secondary | ICD-10-CM | POA: Diagnosis not present

## 2021-11-01 DIAGNOSIS — R053 Chronic cough: Secondary | ICD-10-CM | POA: Diagnosis not present

## 2021-11-01 DIAGNOSIS — I5020 Unspecified systolic (congestive) heart failure: Secondary | ICD-10-CM

## 2021-11-01 DIAGNOSIS — J449 Chronic obstructive pulmonary disease, unspecified: Secondary | ICD-10-CM | POA: Diagnosis not present

## 2021-11-01 DIAGNOSIS — R32 Unspecified urinary incontinence: Secondary | ICD-10-CM | POA: Diagnosis not present

## 2021-11-01 MED ORDER — FUROSEMIDE 20 MG PO TABS: 20.0000 mg | ORAL_TABLET | Freq: Every day | ORAL | 3 refills | Status: DC

## 2021-11-01 NOTE — Progress Notes (Signed)
Cardiology Office Note   Date:  11/01/2021   ID:  Evan [name], DOB 04-19-1927, MRN [id number]  PCP:  [name], MD    No chief complaint on file.  CHF  Wt Readings from Last 3 Encounters:  11/01/21 157 lb (71.2 kg)  08/09/21 150 lb 12.8 oz (68.4 kg)  07/28/21 159 lb 12.8 oz (72.5 kg)       History of Present Illness: Evan [name] is a 88 y.o. male  with CAD.  Prior cardiac w/u: "Coronary artery disease S/p CABG in 1998 S/p Taxus DES to the LCx in 2008 and Taxus DES to the free radial-OM1 Cath 2019: Patent stent in the free radial-OM1, patent stent in the LCx, patent LIMA-LAD, mild plaque in the RCA HFmrEF (heart failure with mildly reduced ejection fraction)  Previously HFpEF  Echocardiogram 1/23: EF 45-50  Atrial fibrillation/flutter Rx with Lovenox after DVT anticoagulation DC'd after intracranial hemorrhage Restarted on anticoagulation with Xarelto in 2020 Aortic stenosis Echo 1/23: Mild with mean gradient 7 Hypertension Hyperlipidemia Lungs CA Hx DVT in 11/19 Hemochromatosis GERD Aortic atherosclerosis Hx CVA Hx intracranial hemorrhage Subdural hematoma after a fall in 6/18 Gout Compression fracture   Prior CV Studies: Echocardiogram 04/13/2021 Lateral HK, EF 45-50, mild LVH, mildly reduced RVSF, normal PASP, moderate BAE, mild MR, mild aortic stenosis (mean gradient 7, DI 0.42)   Echocardiogram 11/21/2018 EF 60-65, mild LVH, mildly reduced RVSF, mildly elevated RVSP, severe LAE, moderate RAE   Cardiac catheterization 06/27/2017 RCA proximal 30, mid 10 LCx stent patent; OM1 80, OM2 27 LAD ostial 100 Free radial-OM1 stent patent LIMA-LAD patent Hyperdynamic LV function with Spaete like ventricle, EF 60-65   Myoview 09/07/2015 No ischemia or infarction, EF 57; low risk   Holter 6/15 No sig arrhythmias"    Past Medical History:  Diagnosis Date   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   Anginal chest pain at rest [facility])     Chronic non, controlled on Lorazepam   Anxiety    Arthritis    "all over"    BPH (benign prostatic hyperplasia)    CAD (coronary artery disease)    Chronic lower back pain    Complication of anesthesia    "problems making water afterwards"   Depression    DJD (degenerative joint disease)    Esophageal ulcer without bleeding    bid ppi indefinitely   Family history of adverse reaction to anesthesia    "children w/PONV"   GERD (gastroesophageal reflux disease)    Headache    "couple/week maybe" (09/04/2015)   Hemochromatosis    Possible, elevated iron stores, hook-like osteophytes on hand films, normal LFTs   Hepatitis    "yellow jaundice as a baby"   History of ASCVD    MULTIVESSEL   Hyperlipidemia    Hypertension    Mild aortic stenosis 06/23/2021   Osteoarthritis    Pneumonia    "several times; got a little now" (09/04/2015)   Rheumatoid arthritis ([place])    Subdural hematoma ([place])    small after fall 09/2016-plavix held, neurosurgery consulted.  Asymptomatic.     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[place]) 02/2018   on Lovenox lifelong since he failed PO Eliquis    Past Surgical History:  Procedure Laterality Date   ARM SKIN LESION BIOPSY / EXCISION Left 09/02/2015   CATARACT EXTRACTION W/ INTRAOCULAR LENS  IMPLANT, BILATERAL Bilateral    CHOLECYSTECTOMY OPEN     CORNEAL TRANSPLANT Bilateral    "one at [facility]; one at  [facility]"   CORONARY ANGIOPLASTY WITH STENT PLACEMENT     CORONARY ARTERY BYPASS GRAFT  1999   DESCENDING AORTIC ANEURYSM REPAIR W/ STENT     DE stent ostium into the right radial free graft at OM-, 02-2007   ESOPHAGOGASTRODUODENOSCOPY N/A 11/09/2014   Procedure: ESOPHAGOGASTRODUODENOSCOPY (EGD);  Surgeon: [name], MD;  Location: [facility] ENDOSCOPY;  Service: Endoscopy;  Laterality: N/A;   INGUINAL HERNIA REPAIR     JOINT REPLACEMENT     LEFT HEART CATH AND CORS/GRAFTS ANGIOGRAPHY N/A 06/27/2017   Procedure: LEFT HEART CATH AND CORS/GRAFTS ANGIOGRAPHY;  Surgeon: [name], MD;   Location: [facility] CV LAB;  Service: Cardiovascular;  Laterality: N/A;   NASAL SINUS SURGERY     SHOULDER OPEN ROTATOR CUFF REPAIR Right    TOTAL KNEE ARTHROPLASTY Bilateral      Current Outpatient Medications  Medication Sig Dispense Refill   acetaminophen (TYLENOL) 325 MG tablet Take 1-2 tablets (325-650 mg total) by mouth every 4 (four) hours as needed for mild pain.     FLUoxetine (PROZAC) 20 MG capsule TAKE 1 CAPSULE EVERY DAY 90 capsule 0   furosemide (LASIX) 20 MG tablet Take 20 mg by mouth daily.     magnesium oxide (MAG-OX) 400 MG tablet Take 400 mg by mouth 2 (two) times daily.     metoprolol tartrate (LOPRESSOR) 25 MG tablet TAKE 1/2 TABLET TWICE DAILY 90 tablet 3   nitroGLYCERIN (NITROSTAT) 0.4 MG SL tablet Place 1 tablet (0.4 mg total) under the tongue every 5 (five) minutes as needed for chest pain. 25 tablet 2   pantoprazole (PROTONIX) 40 MG tablet TAKE 1 TABLET EVERY DAY 90 tablet 3   polyethylene glycol (MIRALAX / GLYCOLAX) 17 g packet Take 17 g by mouth daily as needed for mild constipation. 14 each 0   Rivaroxaban (XARELTO) 15 MG TABS tablet TAKE 1 TABLET BY MOUTH DAILY WITH SUPPER. 90 tablet 1   rosuvastatin (CRESTOR) 5 MG tablet TAKE 1 TABLET EVERY DAY 90 tablet 1   tacrolimus (PROTOPIC) 0.1 % ointment Apply 1 application topically 2 (two) times daily. 100 g 3   tamsulosin (FLOMAX) 0.4 MG CAPS capsule Take 1 capsule (0.4 mg total) by mouth daily. 90 capsule 3   ALPRAZolam (XANAX) 0.5 MG tablet Take 0.5 tablets (0.25 mg total) by mouth 3 (three) times daily as needed for anxiety. (Patient not taking: Reported on 11/01/2021) 30 tablet 0   furosemide (LASIX) 40 MG tablet Take 1 tablet (40 mg total) by mouth daily. (Patient not taking: Reported on 11/01/2021) 90 tablet 3   No current facility-administered medications for this visit.    Allergies:   Feldene [piroxicam], Neomycin-polymyxin-hc, Statins, Doxycycline, Latex, Tequin, and Valium    Social History:  The  patient  reports that he has never smoked. He has been exposed to tobacco smoke. He has quit using smokeless tobacco.  His smokeless tobacco use included chew. He reports that he does not drink alcohol and does not use drugs.   Family History:  The patient's family history includes Arthritis-Osteo in his sister; Heart attack in his brother and another family member.    ROS:  Please see the history of present illness.   Otherwise, review of systems are positive for unsteadiness.   All other systems are reviewed and negative.    PHYSICAL EXAM: VS:  BP 110/68   Pulse (!) 57   Ht 5' 3"  (1.6 m)   Wt 157 lb (71.2 kg)   SpO2 97%  BMI 27.81 kg/m  , BMI Body mass index is 27.81 kg/m. GEN: Well nourished, well developed, in no acute distress HEENT: normal Neck: no JVD, carotid bruits, or masses Cardiac: Irregularly irregular; no murmurs, rubs, or gallops,no edema  Respiratory:  clear to auscultation bilaterally, normal work of breathing GI: soft, nontender, nondistended, + BS MS: no deformity or atrophy Skin: warm and dry, no rash Neuro:  Strength and sensation are intact Psych: euthymic mood, full affect   EKG:   The ekg ordered in 2/23 demonstrates atrial fibrillation, borderline slow ventricular response   Recent Labs: 03/11/2021: ALT 34; Brain Natriuretic Peptide 175 05/29/2021: Magnesium 2.2 06/25/2021: Hemoglobin 10.3; NT-Pro BNP 3,088; Platelets 117 07/06/2021: BUN 25; Creatinine, Ser 1.41; Potassium 4.1; Sodium 134   Lipid Panel    Component Value Date/Time   CHOL 91 (L) 06/22/2020 0953   CHOL 97 09/09/2013 0957   TRIG 66 06/22/2020 0953   TRIG 92 09/09/2013 0957   HDL 35 (L) 06/22/2020 0953   HDL 37 (L) 09/09/2013 0957   CHOLHDL 2.6 06/22/2020 0953   CHOLHDL 2.7 02/03/2018 0524   VLDL 12 02/03/2018 0524   LDLCALC 41 06/22/2020 0953   LDLCALC 42 09/09/2013 0957     Other studies Reviewed: Additional studies/ records that were reviewed today with results  demonstrating: Labs reviewed.LDL 41 in 06/2020   ASSESSMENT AND PLAN:  CAD:  No angina.  Continue medical therapy. AFib: Rate controlled.  Careful to avoid falls.  Xarelto for stroke prevention.  If his heart rate was to get slower than the high 50s, could consider stopping metoprolol.  Given his coronary artery disease, there may be other benefits.  We will continue low-dose metoprolol for now. Mild AS: No CHF.  No symptoms of aortic stenosis.  He is quite frail and I am not sure would be a candidate for any invasive therapy even if he did go on to severe aortic stenosis. Chronic systolic/ diastolic heart failure: Euvolemic on Lasix 20 mg daily.  Did not tolerate Entresto.  BP stable at home.  Hemoglobin was 10.3 in March 2023.  Will recheck hemoglobin to be met given that he is anticoagulated.  If he is progressing to more significant anemia, may need to reconsider anticoagulation.   Current medicines are reviewed at length with the patient today.  The patient concerns regarding his medicines were addressed.  The following changes have been made:  No change  Labs/ tests ordered today include:  No orders of the defined types were placed in this encounter.   Recommend 150 minutes/week of aerobic exercise Low fat, low carb, high fiber diet recommended  Disposition:   FU in 6 months   Signed, Larae Grooms, MD  11/01/2021 3:28 PM    [facility] [address], [place], [place]  [address] Phone: [phone]; Fax: [phone]

## 2021-11-01 NOTE — Patient Instructions (Signed)
Medication Instructions:  Your physician recommends that you continue on your current medications as directed. Please refer to the Current Medication list given to you today.  *If you need a refill on your cardiac medications before your next appointment, please call your pharmacy*   Lab Work: Lab work to be done today--CBC and BMP If you have labs (blood work) drawn today and your tests are completely normal, you will receive your results only by: [place] (if you have MyChart) OR A paper copy in the mail If you have any lab test that is abnormal or we need to change your treatment, we will call you to review the results.   Testing/Procedures: none   Follow-Up: At [facility], you and your health needs are our priority.  As part of our continuing mission to provide you with exceptional heart care, we have created designated Provider Care Teams.  These Care Teams include your primary Cardiologist (physician) and Advanced Practice Providers (APPs -  Physician Assistants and Nurse Practitioners) who all work together to provide you with the care you need, when you need it.  We recommend signing up for the patient portal called "MyChart".  Sign up information is provided on this After Visit Summary.  MyChart is used to connect with patients for Virtual Visits (Telemedicine).  Patients are able to view lab/test results, encounter notes, upcoming appointments, etc.  Non-urgent messages can be sent to your provider as well.   To learn more about what you can do with MyChart, go to [email or web address].    Your next appointment:   6 month(s)  The format for your next appointment:   In Person  Provider:   Larae Grooms, MD     Other Instructions    Important Information About Sugar

## 2021-11-01 NOTE — Patient Instructions (Signed)
Nice to see you again  No changes in medications today  I ordered an esophagram or barium swallow to evaluate if things are going down the wrong tube, aspiration of contents into the lungs.  Based on the results we can decide follow-up.

## 2021-11-02 LAB — BASIC METABOLIC PANEL
BUN/Creatinine Ratio: 15 (ref 10–24)
BUN: 20 mg/dL (ref 10–36)
CO2: 21 mmol/L (ref 20–29)
Calcium: 8.5 mg/dL — ABNORMAL LOW (ref 8.6–10.2)
Chloride: 104 mmol/L (ref 96–106)
Creatinine, Ser: 1.35 mg/dL — ABNORMAL HIGH (ref 0.76–1.27)
Glucose: 99 mg/dL (ref 70–99)
Potassium: 4.3 mmol/L (ref 3.5–5.2)
Sodium: 139 mmol/L (ref 134–144)
eGFR: 49 mL/min/{1.73_m2} — ABNORMAL LOW (ref >59)

## 2021-11-02 LAB — CBC
Hematocrit: 31 % — ABNORMAL LOW (ref 37.5–51.0)
Hemoglobin: 10.6 g/dL — ABNORMAL LOW (ref 13.0–17.7)
MCH: 31.9 pg (ref 26.6–33.0)
MCHC: 34.2 g/dL (ref 31.5–35.7)
MCV: 93 fL (ref 79–97)
Platelets: 129 10*3/uL — ABNORMAL LOW (ref 150–450)
RBC: 3.32 x10E6/uL — ABNORMAL LOW (ref 4.14–5.80)
RDW: 13.3 % (ref 11.6–15.4)
WBC: 6 10*3/uL (ref 3.4–10.8)

## 2021-11-02 NOTE — Progress Notes (Signed)
@Patient  ID: Evan [name], male    DOB: 15-Jun-1927, 88 y.o.   MRN: [id number]  Chief Complaint  Patient presents with   Acute Visit    Pt is here for acute visit. Pt states cough comes and goes. Somedays are worse then others. Pt states early in the morning its bad. Pt is coughing up mucus and sometimes it does have a color and it is thick.     Referring provider: [name], MD  HPI:   88 y.o. man whom we are seeing in follow up  for evaluation of nocturnal dyspnea.    Here for follow-up of cough.  Worse at night.  Thick phlegm.  Worse over the last few weeks.  Tends to come and go.  Really thick and bad in the mornings.  Daughter is concerned about aspiration.  We discussed risk factors as well as possible contributions including aspiration, asthma.  Sounds like this was worse after her recent viral infection.  HPI at initial visit: History per patient and daughter.  About 20 months of nocturnal dyspnea.  Wakes up for 5 AM.  Very short of breath.  Hard to catch breath.  He sits up and after few minutes symptoms improved.  He has minimal dyspnea through the day.  Some of this gradually worsened over time but nothing too significant.  Nontender to concerned about.  Worse on inclines or stairs.  No other time of day where things are better or worse.  No other position makes this better or worse.  No seasonal environmental factors that he can identify to account for the worsening nocturnal dyspnea.  No other alleviating or exacerbating factors.  He had a chest x-ray 01/2021 on my review interpretation reveals clear lungs, mild hyperinflation.  TTE 11/2018 with severely dilated left atrium, reduced RV function, dilated RA, elevated RVSP.  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  Spirometry in 2018 reviewed and interpreted as normal.    Questionaires / Pulmonary Flowsheets:   ACT:      No data to display           MMRC:     No data to display          Epworth:      No data to display          Tests:   FENO:  No results found for: "NITRICOXIDE"  PFT:    Latest Ref Rng & Units 01/05/2016    9:39 AM  PFT Results  FVC-Pre L 3.26   FVC-Predicted Pre % 111   FVC-Post L 3.23   FVC-Predicted Post % 109   Pre FEV1/FVC % % 80   Post FEV1/FCV % % 80   FEV1-Pre L 2.61   FEV1-Predicted Pre % 131   FEV1-Post L 2.58   DLCO uncorrected ml/min/mmHg 11.48   DLCO UNC% % 44   DLCO corrected ml/min/mmHg 11.98   DLCO COR %Predicted % 46   DLVA Predicted % 56   TLC L 5.47   TLC % Predicted % 90   RV % Predicted % 86   Personally reviewed interpret as normal spirometry, lung lines within normal limits, DLCO severely reduced consistent with pulmonary vascular disease  WALK:     10/11/2016   12:08 PM  SIX MIN WALK  Supplimental Oxygen during Test? (L/min) No  Tech Comments: Pt walked at slow pace with steady gait. Pt was unable to  complete additional laps as he stated "my legs are getting weak."    Imaging: Personally reviewed and as per EMR discussion this note  Lab Results: Personally reviewed CBC    Component Value Date/Time   WBC 6.0 11/01/2021 1542   WBC 7.3 05/29/2021 1535   RBC 3.32 (L) 11/01/2021 1542   RBC 3.47 (L) 05/29/2021 1535   HGB 10.6 (L) 11/01/2021 1542   HGB 13.4 08/16/2016 1155   HCT 31.0 (L) 11/01/2021 1542   HCT 39.8 08/16/2016 1155   PLT 129 (L) 11/01/2021 1542   MCV 93 11/01/2021 1542   MCV 92.3 08/16/2016 1155   MCH 31.9 11/01/2021 1542   MCH 31.4 05/29/2021 1535   MCHC 34.2 11/01/2021 1542   MCHC 32.3 05/29/2021 1535   RDW 13.3 11/01/2021 1542   RDW 13.3 08/16/2016 1155   LYMPHSABS 1.6 05/29/2021 1535   LYMPHSABS 1.3 08/16/2016 1155   MONOABS 1.0 05/29/2021 1535   MONOABS 1.2 (H) 08/16/2016 1155   EOSABS 0.1 05/29/2021 1535   EOSABS 0.1 08/16/2016 1155   BASOSABS 0.0 05/29/2021 1535   BASOSABS 0.0 08/16/2016 1155    BMET     Component Value Date/Time   NA 139 11/01/2021 1542   NA 143 08/16/2016 1155   K 4.3 11/01/2021 1542   K 4.3 08/16/2016 1155   CL 104 11/01/2021 1542   CO2 21 11/01/2021 1542   CO2 24 08/16/2016 1155   GLUCOSE 99 11/01/2021 1542   GLUCOSE 108 (H) 05/31/2021 1242   GLUCOSE 88 08/16/2016 1155   GLUCOSE 120 (H) 04/20/2015 1600   BUN 20 11/01/2021 1542   BUN 14.6 08/16/2016 1155   CREATININE 1.35 (H) 11/01/2021 1542   CREATININE 1.47 (H) 05/31/2021 1242   CREATININE 1.2 08/16/2016 1155   CALCIUM 8.5 (L) 11/01/2021 1542   CALCIUM 9.0 08/16/2016 1155   GFRNONAA 37 (L) 05/29/2021 1535   GFRNONAA 47 (L) 06/22/2020 1602   GFRAA 54 (L) 06/22/2020 1602    BNP    Component Value Date/Time   BNP 175 (H) 03/11/2021 1125    ProBNP    Component Value Date/Time   PROBNP 3,088 (H) 06/25/2021 0911   PROBNP 335.0 (H) 01/23/2020 1134    Specialty Problems       Pulmonary Problems   Asthma, chronic   Adenocarcinoma of right lung (HCC)   Pleurisy   Radiation pneumonitis (HCC)   Acute bronchitis   Dyspnea   SOB (shortness of breath)   Supplemental oxygen dependent   COPD (chronic obstructive pulmonary disease) (HCC)    Allergies  Allergen Reactions   Feldene [Piroxicam] Other (See Comments)    REACTION: Blisters   Neomycin-Polymyxin-Hc Itching   Statins Other (See Comments)    Myalgias   Doxycycline Other (See Comments)    REACTION:  unknown   Latex Rash   Tequin Anxiety   Valium Other (See Comments)    REACTION:  unknown    Immunization History  Administered Date(s) Administered   Fluad Quad(high Dose 65+) 01/04/2019, 01/23/2020, 04/08/2021   Influenza Whole 01/26/2007, 01/10/2008, 12/30/2009   Influenza, High Dose Seasonal PF 12/23/2016, 02/09/2018   Influenza,inj,Quad PF,6+ Mos 12/18/2012, 01/16/2015, 12/29/2015   PFIZER(Purple Top)SARS-COV-2 Vaccination 05/10/2019, 06/04/2019, 02/05/2020   Pneumococcal Conjugate-13 06/24/2014   Pneumococcal Polysaccharide-23  01/25/2000   Td 12/27/2000, 03/09/2010   Tdap 03/09/2010    Past Medical History:  Diagnosis Date   Adenocarcinoma of right lung ([place]) 01/06/2016   Anginal chest pain at rest [facility])    Chronic  non, controlled on Lorazepam   Anxiety    Arthritis    "all over"    BPH (benign prostatic hyperplasia)    CAD (coronary artery disease)    Chronic lower back pain    Complication of anesthesia    "problems making water afterwards"   Depression    DJD (degenerative joint disease)    Esophageal ulcer without bleeding    bid ppi indefinitely   Family history of adverse reaction to anesthesia    "children w/PONV"   GERD (gastroesophageal reflux disease)    Headache    "couple/week maybe" (09/04/2015)   Hemochromatosis    Possible, elevated iron stores, hook-like osteophytes on hand films, normal LFTs   Hepatitis    "yellow jaundice as a baby"   History of ASCVD    MULTIVESSEL   Hyperlipidemia    Hypertension    Mild aortic stenosis 06/23/2021   Osteoarthritis    Pneumonia    "several times; got a little now" (09/04/2015)   Rheumatoid arthritis ([place])    Subdural hematoma ([place])    small after fall 09/2016-plavix held, neurosurgery consulted.  Asymptomatic.     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   ALPRAZolam (XANAX) 0.5 MG tablet Take 0.5 tablets (0.25 mg total) by mouth 3 (three) times daily as needed for anxiety.   FLUoxetine (PROZAC) 20 MG capsule TAKE 1  CAPSULE EVERY DAY   furosemide (LASIX) 20 MG tablet Take 1 tablet (20 mg total) by mouth daily.   magnesium oxide (MAG-OX) 400 MG tablet Take 400 mg by mouth 2 (two) times daily.   metoprolol tartrate (LOPRESSOR) 25 MG tablet TAKE 1/2 TABLET TWICE DAILY   nitroGLYCERIN (NITROSTAT) 0.4 MG SL tablet Place 1 tablet (0.4 mg total) under the tongue every 5 (five) minutes as needed for chest pain.   pantoprazole (PROTONIX) 40 MG tablet TAKE 1 TABLET EVERY DAY   polyethylene glycol (MIRALAX / GLYCOLAX) 17 g packet Take 17 g by mouth daily as needed for mild constipation.   Rivaroxaban (XARELTO) 15 MG TABS tablet TAKE 1 TABLET BY MOUTH DAILY WITH SUPPER.   rosuvastatin (CRESTOR) 5 MG tablet TAKE 1 TABLET EVERY DAY   tacrolimus (PROTOPIC) 0.1 % ointment Apply 1 application topically 2 (two) times daily.   tamsulosin (FLOMAX) 0.4 MG CAPS capsule Take 1 capsule (0.4 mg total) by mouth daily.   No facility-administered encounter medications on file as of 11/01/2021.     Review of Systems  Review of Systems  N/a Physical Exam  BP 118/64 (BP Location: Right Arm, Patient Position: Sitting, Cuff Size: Normal)   Pulse 60   Temp 98.5 F (36.9 C) (Oral)   Ht 5\' 3"  (1.6 m)   Wt 157 lb (71.2 kg)   SpO2 97%   BMI 27.81 kg/m   Wt Readings from Last 5 Encounters:  11/01/21 157 lb (71.2 kg)  11/01/21 157 lb (71.2 kg)  08/09/21 150  lb 12.8 oz (68.4 kg)  07/28/21 159 lb 12.8 oz (72.5 kg)  06/25/21 153 lb 6.4 oz (69.6 kg)    BMI Readings from Last 5 Encounters:  11/01/21 27.81 kg/m  11/01/21 27.81 kg/m  08/09/21 26.71 kg/m  07/28/21 28.31 kg/m  06/25/21 27.17 kg/m     Physical Exam General: Frail, sitting in wheelchair Eyes: EOMI, no icterus Neck: Supple, no JVP Cardiovascular: Regular rate and rhythm, no murmur, trace bilateral edema of the ankle Pulmonary: Clear, normal work of breathing Abdomen: Nondistended, bowel sounds present MSK: No synovitis, no joint effusion Neuro: No  focal weakness, sensation appears intact, hard of hearing Psych: Normal mood, flat affect  Assessment & Plan:   Nocturnal dyspnea/PND: When lying supine, in the evening.  Improved rather rapidly which argues against PND.  However given nocturnal symptoms with minimal daytime symptoms in the setting of chronic severely dilated LA in 2020 high suspicion for pulmonary edema, possibly exacerbated by some disordered breathing.  Not a great candidate for OSA treatment given cognitive impairment.  Overall seems improved over time.  Cough: Concern for aspiration given description.  Happening at night.  Try Stiolto in the past, unclear if it helps with this or dyspnea.  Consider ICS/LABA therapy in the future if no signs of aspiration or risk of aspiration.  Barium swallow with 2 contrast media ordered today for further evaluation.   Return if symptoms worsen or fail to improve.   Lanier Clam, MD 11/02/2021

## 2021-11-05 ENCOUNTER — Other Ambulatory Visit: Payer: Self-pay | Admitting: Pulmonary Disease

## 2021-11-05 ENCOUNTER — Ambulatory Visit (HOSPITAL_COMMUNITY)
Admission: RE | Admit: 2021-11-05 | Discharge: 2021-11-05 | Disposition: A | Discharge status: Home / Self Care | Payer: Medicare HMO | Source: Ambulatory Visit | Attending: Pulmonary Disease | Admitting: Pulmonary Disease

## 2021-11-05 DIAGNOSIS — K224 Dyskinesia of esophagus: Secondary | ICD-10-CM | POA: Diagnosis not present

## 2021-11-05 DIAGNOSIS — T17908S Unspecified foreign body in respiratory tract, part unspecified causing other injury, sequela: Secondary | ICD-10-CM | POA: Insufficient documentation

## 2021-11-05 DIAGNOSIS — R053 Chronic cough: Secondary | ICD-10-CM | POA: Diagnosis not present

## 2021-11-05 DIAGNOSIS — R059 Cough, unspecified: Secondary | ICD-10-CM | POA: Diagnosis not present

## 2021-11-05 NOTE — Progress Notes (Signed)
Evidence of esophageal dysmotility which could put him at risk for aspiration, silent aspiration at night. Please inform daughter - thanks!

## 2021-11-16 DIAGNOSIS — L89519 Pressure ulcer of right ankle, unspecified stage: Secondary | ICD-10-CM | POA: Diagnosis not present

## 2021-11-16 DIAGNOSIS — S0190XA Unspecified open wound of unspecified part of head, initial encounter: Secondary | ICD-10-CM | POA: Diagnosis not present

## 2021-11-16 DIAGNOSIS — L8940 Pressure ulcer of contiguous site of back, buttock and hip, unspecified stage: Secondary | ICD-10-CM | POA: Diagnosis not present

## 2021-11-19 DIAGNOSIS — R3915 Urgency of urination: Secondary | ICD-10-CM | POA: Diagnosis not present

## 2021-11-25 ENCOUNTER — Ambulatory Visit (INDEPENDENT_AMBULATORY_CARE_PROVIDER_SITE_OTHER): Payer: Medicare HMO | Admitting: Family Medicine

## 2021-11-25 VITALS — BP 95/55 | HR 50 | Temp 97.8°F | Ht 63.0 in | Wt 160.0 lb

## 2021-11-25 DIAGNOSIS — L304 Erythema intertrigo: Secondary | ICD-10-CM | POA: Diagnosis not present

## 2021-11-25 MED ORDER — FLUCONAZOLE 150 MG PO TABS: 150.0000 mg | ORAL_TABLET | ORAL | 0 refills | Status: DC

## 2021-11-25 NOTE — Progress Notes (Signed)
Patient presents today with his daughter.  She is concerned about a rash.  The skin on the right side of his scrotum is erythematous and tender.  It appears to be intertrigo.  It also involves the crease between his thigh and his scrotum and the medial upper thigh.  They have been using miconazole without improvement.  There is no evidence of cellulitis.  They have been putting powder to try to keep the area dry and also zinc paste as a barrier cream but the rash will not improve.  Has been there for more than 4 weeks.  There is also a rash on his right gluteus adjacent to the cleft.  It is erythematous hard pink skin with scale.  It could either be tinea however on the border of his leg and not sharp.  It is also possibly nonblanchable skin stage I pressure sore versus lichen simplex chronicus given the scale pain  Past Medical History:  Diagnosis Date   Adenocarcinoma of right lung ([place]) 01/06/2016   Anginal chest pain at rest [facility])    Chronic non, controlled on Lorazepam   Anxiety    Arthritis    "all over"    BPH (benign prostatic hyperplasia)    CAD (coronary artery disease)    Chronic lower back pain    Complication of anesthesia    "problems making water afterwards"   Depression    DJD (degenerative joint disease)    Esophageal ulcer without bleeding    bid ppi indefinitely   Family history of adverse reaction to anesthesia    "children w/PONV"   GERD (gastroesophageal reflux disease)    Headache    "couple/week maybe" (09/04/2015)   Hemochromatosis    Possible, elevated iron stores, hook-like osteophytes on hand films, normal LFTs   Hepatitis    "yellow jaundice as a baby"   History of ASCVD    MULTIVESSEL   Hyperlipidemia    Hypertension    Mild aortic stenosis 06/23/2021   Osteoarthritis    Pneumonia    "several times; got a little now" (09/04/2015)   Rheumatoid arthritis ([place])    Subdural hematoma ([place])    small after fall 09/2016-plavix held, neurosurgery consulted.   Asymptomatic.     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 since he failed PO Eliquis   Past Surgical History:  Procedure Laterality Date   ARM SKIN LESION BIOPSY / EXCISION Left 09/02/2015   CATARACT EXTRACTION W/ INTRAOCULAR LENS  IMPLANT, BILATERAL Bilateral    CHOLECYSTECTOMY OPEN     CORNEAL TRANSPLANT Bilateral    "one at [facility]; one at [facility]"   [place] W/ STENT     DE stent ostium into the right radial free graft at OM-, 02-2007   ESOPHAGOGASTRODUODENOSCOPY N/A 11/09/2014   Procedure: ESOPHAGOGASTRODUODENOSCOPY (EGD);  Surgeon: [name], MD;  Location[facility] END[facility]COPY;  Service: Endoscopy;  Laterality: N/A;   INGUINAL HERNIA REPAIR     JOINT REPLACEMENT     LEFT HEART CATH AND CORS/GRAFTS ANGIOGRAPHY N/A 06/27/2017   Procedure: LEFT HEART CATH AND CORS/GRAFTS ANGIOGRAPHY;  Surgeon: [name], MD;  Location: [place] CV LAB;  Service: Cardiovascular;  Laterality: N/A;   NASAL SINUS SURGERY     SHOULDER OPEN ROTATOR CUFF REPAIR Right    TOTAL KNEE ARTHROPLASTY Bilateral    Current Outpatient Medications on File Prior to Visit  Medication  Sig Dispense Refill   acetaminophen (TYLENOL) 325 MG tablet Take 1-2 tablets (325-650 mg total) by mouth every 4 (four) hours as needed for mild pain.     ALPRAZolam (XANAX) 0.5 MG tablet Take 0.5 tablets (0.25 mg total) by mouth 3 (three) times daily as needed for anxiety. 30 tablet 0   FLUoxetine (PROZAC) 20 MG capsule TAKE 1 CAPSULE EVERY DAY 90 capsule 0   furosemide (LASIX) 20 MG tablet Take 1 tablet (20 mg total) by mouth daily. 90 tablet 3   magnesium oxide (MAG-OX) 400 MG tablet Take 400 mg by mouth 2 (two) times daily.     metoprolol tartrate (LOPRESSOR) 25 MG tablet TAKE 1/2 TABLET TWICE DAILY 90 tablet 3   nitroGLYCERIN (NITROSTAT) 0.4 MG SL tablet Place 1 tablet (0.4 mg total) under the tongue every 5 (five)  minutes as needed for chest pain. 25 tablet 2   pantoprazole (PROTONIX) 40 MG tablet TAKE 1 TABLET EVERY DAY 90 tablet 3   polyethylene glycol (MIRALAX / GLYCOLAX) 17 g packet Take 17 g by mouth daily as needed for mild constipation. 14 each 0   Rivaroxaban (XARELTO) 15 MG TABS tablet TAKE 1 TABLET BY MOUTH DAILY WITH SUPPER. 90 tablet 1   rosuvastatin (CRESTOR) 5 MG tablet TAKE 1 TABLET EVERY DAY 90 tablet 1   tacrolimus (PROTOPIC) 0.1 % ointment Apply 1 application topically 2 (two) times daily. 100 g 3   tamsulosin (FLOMAX) 0.4 MG CAPS capsule Take 1 capsule (0.4 mg total) by mouth daily. 90 capsule 3   No current facility-administered medications on file prior to visit.   Allergies  Allergen Reactions   Feldene [Piroxicam] Other (See Comments)    REACTION: Blisters   Neomycin-Polymyxin-Hc Itching   Statins Other (See Comments)    Myalgias   Doxycycline Other (See Comments)    REACTION:  unknown   Latex Rash   Tequin Anxiety   Valium Other (See Comments)    REACTION:  unknown   Social History   Socioeconomic History   Marital status: Widowed    Spouse name: Not on file   Number of children: Not on file   Years of education: Not on file   Highest education level: Not on file  Occupational History   Not on file  Tobacco Use   Smoking status: Never    Passive exposure: Yes   Smokeless tobacco: Former    Types: Chew   Tobacco comments:    "quit chewing in the 1960s"  Vaping Use   Vaping Use: Never used  Substance and Sexual Activity   Alcohol use: No   Drug use: No   Sexual activity: Never  Other Topics Concern   Not on file  Social History Narrative   Not on file   Social Determinants of Health   Financial Resource Strain: Low Risk  (05/07/2021)   Overall Financial Resource Strain (CARDIA)    Difficulty of Paying Living Expenses: Not hard at all  Food Insecurity: No Food Insecurity (05/07/2021)   Hunger Vital Sign    Worried About Running Out of Food in the Last  Year: Never true    [place] in the Last Year: Never true  Transportation Needs: No Transportation Needs (05/07/2021)   PRAPARE - Hydrologist (Medical): No    Lack of Transportation (Non-Medical): No  Physical Activity: Insufficiently Active (05/07/2021)   Exercise Vital Sign    Days of Exercise per Week: 3 days  Minutes of Exercise per Session: 20 min  Stress: No Stress Concern Present (05/07/2021)   [place]    Feeling of Stress : Not at all  Social Connections: Socially Isolated (05/07/2021)   Social Connection and Isolation Panel [NHANES]    Frequency of Communication with Friends and Family: More than three times a week    Frequency of Social Gatherings with Friends and Family: More than three times a week    Attends Religious Services: Never    Marine scientist or Organizations: No    Attends Archivist Meetings: Never    Marital Status: Widowed  Intimate Partner Violence: Not At Risk (05/07/2021)   Humiliation, Afraid, Rape, and Kick questionnaire    Fear of Current or Ex-Partner: No    Emotionally Abused: No    Physically Abused: No    Sexually Abused: No      Review of Systems     Objective:   Physical Exam Vitals reviewed.  Constitutional:      General: He is not in acute distress.    Appearance: Normal appearance. He is not toxic-appearing or diaphoretic.  Cardiovascular:     Rate and Rhythm: Normal rate and regular rhythm.     Heart sounds: Normal heart sounds. No murmur heard.    No friction rub. No gallop.  Pulmonary:     Effort: Pulmonary effort is normal. No respiratory distress.     Breath sounds: Normal breath sounds. No wheezing, rhonchi or rales.  Genitourinary:   Musculoskeletal:     Right lower leg: No edema.     Left lower leg: No edema.       Legs:  Skin:    Findings: Erythema and rash present. No petechiae or wound. Rash is  scaling. Rash is not crusting, macular, nodular, purpuric, pustular or vesicular.  Neurological:     Mental Status: He is alert.   Right-sided hemiparesis with weakness in his right leg.  Weakness with knee flexion and extension and ankle flexion and extension.  Ataxia in the right upper arm and right lower leg.         Assessment & Plan:  Intertrigo I believe the rash adjacent to his scrotum is likely intertrigo.  He may have a similar rash on his gluteus although the gluteus could also be lichen simplex chronicus due to chronic pressure irritation and moisture from sitting.  I will treat the patient empirically with Diflucan 150 mg weekly for 4 weeks straight to see how much the rash improves.  Continue to use zinc as a barrier cream to fight away moisture and also to use topical miconazole.  Recheck in 4 weeks to see if the rash persist.  If persistent on his right gluteus I will try steroid for possible lichen simplex chronicus

## 2021-12-01 ENCOUNTER — Other Ambulatory Visit (HOSPITAL_COMMUNITY): Payer: Self-pay

## 2021-12-01 DIAGNOSIS — H52221 Regular astigmatism, right eye: Secondary | ICD-10-CM | POA: Diagnosis not present

## 2021-12-01 DIAGNOSIS — H52222 Regular astigmatism, left eye: Secondary | ICD-10-CM | POA: Diagnosis not present

## 2021-12-01 DIAGNOSIS — H353130 Nonexudative age-related macular degeneration, bilateral, stage unspecified: Secondary | ICD-10-CM | POA: Diagnosis not present

## 2021-12-01 DIAGNOSIS — Z961 Presence of intraocular lens: Secondary | ICD-10-CM | POA: Diagnosis not present

## 2021-12-01 DIAGNOSIS — H5212 Myopia, left eye: Secondary | ICD-10-CM | POA: Diagnosis not present

## 2021-12-01 DIAGNOSIS — H5201 Hypermetropia, right eye: Secondary | ICD-10-CM | POA: Diagnosis not present

## 2021-12-01 DIAGNOSIS — H524 Presbyopia: Secondary | ICD-10-CM | POA: Diagnosis not present

## 2021-12-01 DIAGNOSIS — R32 Unspecified urinary incontinence: Secondary | ICD-10-CM | POA: Diagnosis not present

## 2021-12-01 DIAGNOSIS — H04123 Dry eye syndrome of bilateral lacrimal glands: Secondary | ICD-10-CM | POA: Diagnosis not present

## 2021-12-01 MED ORDER — XIIDRA 5 % OP SOLN
1.0000 [drp] | Freq: Two times a day (BID) | OPHTHALMIC | 12 refills | Status: DC
  Filled 2021-12-01: qty 60, 30d supply, fill #0

## 2021-12-02 ENCOUNTER — Other Ambulatory Visit (HOSPITAL_COMMUNITY): Payer: Self-pay

## 2021-12-02 MED ORDER — CYCLOSPORINE 0.05 % OP EMUL
1.0000 [drp] | Freq: Two times a day (BID) | OPHTHALMIC | 6 refills | Status: DC
  Filled 2021-12-02: qty 60, 30d supply, fill #0

## 2021-12-14 ENCOUNTER — Other Ambulatory Visit (HOSPITAL_COMMUNITY): Payer: Self-pay

## 2021-12-17 DIAGNOSIS — R3915 Urgency of urination: Secondary | ICD-10-CM | POA: Diagnosis not present

## 2021-12-17 DIAGNOSIS — S0190XA Unspecified open wound of unspecified part of head, initial encounter: Secondary | ICD-10-CM | POA: Diagnosis not present

## 2021-12-17 DIAGNOSIS — L8940 Pressure ulcer of contiguous site of back, buttock and hip, unspecified stage: Secondary | ICD-10-CM | POA: Diagnosis not present

## 2021-12-17 DIAGNOSIS — L89519 Pressure ulcer of right ankle, unspecified stage: Secondary | ICD-10-CM | POA: Diagnosis not present

## 2021-12-21 ENCOUNTER — Telehealth: Payer: Self-pay | Admitting: Pharmacist

## 2021-12-21 NOTE — Progress Notes (Signed)
Chronic Care Management Pharmacy Assistant   Name: Evan [name]  MRN: [id number] DOB: 1927-09-22   Reason for Encounter: Disease State - Hypertension Call     Recent office visits:  11/25/21 [name]. MD - Family Medicine - Intertrigo - fluconazole (DIFLUCAN) 150 MG tablet prescribed. Recheck in 4 weeks.   Recent consult visits:  11/01/21 Larey Days, MD - Pulmonology - Chronic cough - Swallow study ordered. Follow up as scheduled.   11/01/21 Larae Grooms, MD - Cardiology - A Fib - Labs were ordered. furosemide (LASIX) 20 MG tablet ordered.   Hospital visits:  None in previous 6 months  Medications: Outpatient Encounter Medications as of 12/21/2021  Medication Sig   acetaminophen (TYLENOL) 325 MG tablet Take 1-2 tablets (325-650 mg total) by mouth every 4 (four) hours as needed for mild pain.   ALPRAZolam (XANAX) 0.5 MG tablet Take 0.5 tablets (0.25 mg total) by mouth 3 (three) times daily as needed for anxiety.   cycloSPORINE (RESTASIS) 0.05 % ophthalmic emulsion Place 1 drop into affected  eye 2 (two) times daily.   fluconazole (DIFLUCAN) 150 MG tablet Take 1 tablet (150 mg total) by mouth once a week.   FLUoxetine (PROZAC) 20 MG capsule TAKE 1 CAPSULE EVERY DAY   furosemide (LASIX) 20 MG tablet Take 1 tablet (20 mg total) by mouth daily.   Lifitegrast (XIIDRA) 5 % SOLN Place 1 drop into both eyes 2 (two) times daily.   magnesium oxide (MAG-OX) 400 MG tablet Take 400 mg by mouth 2 (two) times daily.   metoprolol tartrate (LOPRESSOR) 25 MG tablet TAKE 1/2 TABLET TWICE DAILY   nitroGLYCERIN (NITROSTAT) 0.4 MG SL tablet Place 1 tablet (0.4 mg total) under the tongue every 5 (five) minutes as needed for chest pain.   pantoprazole (PROTONIX) 40 MG tablet TAKE 1 TABLET EVERY DAY   polyethylene glycol (MIRALAX / GLYCOLAX) 17 g packet Take 17 g by mouth daily as needed for mild constipation.   Rivaroxaban (XARELTO) 15 MG TABS tablet TAKE 1 TABLET BY MOUTH DAILY WITH  SUPPER.   rosuvastatin (CRESTOR) 5 MG tablet TAKE 1 TABLET EVERY DAY   tacrolimus (PROTOPIC) 0.1 % ointment Apply 1 application topically 2 (two) times daily.   tamsulosin (FLOMAX) 0.4 MG CAPS capsule Take 1 capsule (0.4 mg total) by mouth daily.   No facility-administered encounter medications on file as of 12/21/2021.   Current antihypertensive regimen:  metoprolol tartrate (LOPRESSOR) 25 MG tablet  How often are you checking your Blood Pressure?  Patient reported checking blood pressures  Current home BP readings:     What recent interventions/DTPs have been made by any provider to improve Blood Pressure control since last CPP Visit:  Patient has not had any recent changes in medications since last visit with CPP    Any recent hospitalizations or ED visits since last visit with CPP? Patient has not had any hospitalizations or ED visits since last visit with CPP   What diet changes have been made to improve Blood Pressure Control?  Patient reported   What exercise is being done to improve your Blood Pressure Control?   Patient reported   Adherence Review: Is the patient currently on ACE/ARB medication?  Does the patient have >5 day gap between last estimated fill dates?      Care Gaps   AWV: done 05/07/21 Colonoscopy: due 04/01/20 DM Eye Exam: N/A DM Foot Exam: N/A Microalbumin: N/A HbgAIC: N/A DEXA:  N/A Mammogram: N/A  Star Rating Drugs: Rosuvastatin (CRESTOR) 5 MG tablet - last filled 10/26/21 90 days     Future Appointments  Date Time Provider [place]  03/17/2022 12:30 PM [facility]M PHARMACIST [facility]  05/13/2022 12:00 PM [facility]-NURSE HEALTH ADVISOR [facility]   Multiple attempts were made to contact patient. Attempts were unsuccessful. / ls,CMA   Jobe Gibbon, [place] Pharmacist Assistant  [phone]

## 2021-12-31 DIAGNOSIS — R32 Unspecified urinary incontinence: Secondary | ICD-10-CM | POA: Diagnosis not present

## 2022-01-05 ENCOUNTER — Encounter: Payer: Self-pay | Admitting: Family Medicine

## 2022-01-05 ENCOUNTER — Other Ambulatory Visit: Payer: Self-pay | Admitting: Family Medicine

## 2022-01-05 ENCOUNTER — Ambulatory Visit (INDEPENDENT_AMBULATORY_CARE_PROVIDER_SITE_OTHER): Payer: Medicare HMO | Admitting: Family Medicine

## 2022-01-05 VITALS — BP 120/68 | HR 63 | Temp 97.7°F | Ht 63.0 in | Wt 156.0 lb

## 2022-01-05 DIAGNOSIS — R5383 Other fatigue: Secondary | ICD-10-CM | POA: Diagnosis not present

## 2022-01-05 DIAGNOSIS — R34 Anuria and oliguria: Secondary | ICD-10-CM

## 2022-01-05 LAB — URINALYSIS, ROUTINE W REFLEX MICROSCOPIC
Bilirubin Urine: NEGATIVE
Glucose, UA: NEGATIVE
Hgb urine dipstick: NEGATIVE
Ketones, ur: NEGATIVE
Leukocytes,Ua: NEGATIVE
Nitrite: NEGATIVE
Protein, ur: NEGATIVE
Specific Gravity, Urine: 1.015 (ref 1.001–1.035)
pH: 6 (ref 5.0–8.0)

## 2022-01-05 NOTE — Progress Notes (Signed)
   Acute Office Visit  Subjective:     Patient ID: Evan [name], male    DOB: July 19, 1927, 88 y.o.   MRN: [id number]  Chief Complaint  Patient presents with   Urinary Tract Infection    Urinating a lot however, can't go.  Pt sleeping a lot/per pt stated not feeling good yesterday    Patient presents today for urinary retention with daughter to aid in history. She reports diminished appetite and sleeping more. Night urine has decreased from 600-1085mL to 316mL since 3-4 days ago. Patient reported to daughter he "doesn't feel good" yesterday. Consuming his normal amount of water and normal activity. Daughter has not noticed any other symptoms than decreased urine output and dribbling. She reports that he is peeing more during the day and less at night.   Review of Systems  Constitutional: Negative.   Genitourinary:  Positive for urgency. Negative for dysuria, frequency and hematuria.  Neurological: Negative.   All other systems reviewed and are negative.       Objective:    BP 120/68   Pulse 63   Temp 97.7 F (36.5 C) (Oral)   Ht 5\' 3"  (1.6 m)   Wt 156 lb (70.8 kg)   SpO2 100%   BMI 27.63 kg/m    Physical Exam Constitutional:      Appearance: Normal appearance.  Abdominal:     General: Abdomen is flat. There is no distension.     Palpations: Abdomen is soft.     Tenderness: There is no abdominal tenderness. There is no guarding.  Skin:    General: Skin is warm and dry.  Neurological:     General: No focal deficit present.     Mental Status: He is alert. Mental status is at baseline.  Psychiatric:        Mood and Affect: Mood normal.        Behavior: Behavior normal.     No results found for any visits on 01/05/22.  Urine dipstick shows negative for all components.  Micro exam: not done.     Assessment & Plan:  Decreased urine output - Plan: COMPLETE METABOLIC PANEL WITH GFR, Urinalysis, Routine w reflex microscopic  Other fatigue - Plan: CBC with  Differential/Platelet UA negative. Will check labs to rule out anemia as cause of fatigue, alterations in kidney function, or dehydration. Given he is making more urine during the day and less at night I am not concerned for urinary retention. Instructed to seek immediate care if he does not void for more than 6 hours or experiences symptoms such as worsening fatigue, lethargy, fevers/chills/night sweats, nausea/vomiting, or dysuria.  No orders of the defined types were placed in this encounter.   Return if symptoms worsen or fail to improve.  Rubie Maid, FNP

## 2022-01-05 NOTE — Telephone Encounter (Signed)
Requested Prescriptions  Pending Prescriptions Disp Refills  . FLUoxetine (PROZAC) 20 MG capsule [Pharmacy Med Name: FLUOXETINE HYDROCHLORIDE 20 MG Capsule] 90 capsule 0    Sig: TAKE 1 CAPSULE EVERY DAY     Psychiatry:  Antidepressants - SSRI Passed - 01/05/2022 10:06 AM      Passed - Completed PHQ-2 or PHQ-9 in the last 360 days      Passed - Valid encounter within last 6 months    Recent Outpatient Visits          4 months ago Acute pain of right shoulder   [place] [facility] Schaumann, Cammie Mcgee, MD   6 months ago [place] [name], MD   7 months ago Acute systolic congestive heart failure ([place])   [place] [name], MD   8 months ago Acute cough   [place] Eulogio Bear, NP   9 months ago Need for immunization against influenza   [place], Cammie Mcgee, MD      Future Appointments            Today Rubie Maid, [place], [place]

## 2022-01-06 LAB — CBC WITH DIFFERENTIAL/PLATELET
Absolute Monocytes: 844 cells/uL (ref 200–950)
Basophils Absolute: 34 cells/uL (ref 0–200)
Basophils Relative: 0.5 %
Eosinophils Absolute: 54 cells/uL (ref 15–500)
Eosinophils Relative: 0.8 %
HCT: 34.6 % — ABNORMAL LOW (ref 38.5–50.0)
Hemoglobin: 11.3 g/dL — ABNORMAL LOW (ref 13.2–17.1)
Lymphs Abs: 1541 cells/uL (ref 850–3900)
MCH: 32.1 pg (ref 27.0–33.0)
MCHC: 32.7 g/dL (ref 32.0–36.0)
MCV: 98.3 fL (ref 80.0–100.0)
MPV: 11.5 fL (ref 7.5–12.5)
Monocytes Relative: 12.6 %
Neutro Abs: 4228 cells/uL (ref 1500–7800)
Neutrophils Relative %: 63.1 %
Platelets: 144 10*3/uL (ref 140–400)
RBC: 3.52 10*6/uL — ABNORMAL LOW (ref 4.20–5.80)
RDW: 12.8 % (ref 11.0–15.0)
Total Lymphocyte: 23 %
WBC: 6.7 10*3/uL (ref 3.8–10.8)

## 2022-01-06 LAB — COMPLETE METABOLIC PANEL WITH GFR
AG Ratio: 1.3 (calc) (ref 1.0–2.5)
ALT: 6 U/L — ABNORMAL LOW (ref 9–46)
AST: 12 U/L (ref 10–35)
Albumin: 3.7 g/dL (ref 3.6–5.1)
Alkaline phosphatase (APISO): 97 U/L (ref 35–144)
BUN/Creatinine Ratio: 17 (calc) (ref 6–22)
BUN: 25 mg/dL (ref 7–25)
CO2: 28 mmol/L (ref 20–32)
Calcium: 8.8 mg/dL (ref 8.6–10.3)
Chloride: 105 mmol/L (ref 98–110)
Creat: 1.49 mg/dL — ABNORMAL HIGH (ref 0.70–1.22)
Globulin: 2.9 g/dL (calc) (ref 1.9–3.7)
Glucose, Bld: 93 mg/dL (ref 65–99)
Potassium: 4.3 mmol/L (ref 3.5–5.3)
Sodium: 139 mmol/L (ref 135–146)
Total Bilirubin: 0.9 mg/dL (ref 0.2–1.2)
Total Protein: 6.6 g/dL (ref 6.1–8.1)
eGFR: 44 mL/min/{1.73_m2} — ABNORMAL LOW (ref >=60)

## 2022-01-10 ENCOUNTER — Other Ambulatory Visit: Payer: Self-pay | Admitting: Family Medicine

## 2022-01-14 DIAGNOSIS — R3915 Urgency of urination: Secondary | ICD-10-CM | POA: Diagnosis not present

## 2022-01-27 ENCOUNTER — Telehealth: Payer: Self-pay

## 2022-01-27 NOTE — Telephone Encounter (Signed)
Pt 's daughter called in concerned that pt has a pretty bad cough with some congestion and coughing up a very little sputum. Pt's daughter just wanted some suggestions as to what she may be able to give pt otc. Pt's daughter would like a cb from the nurse please.  Cb#: [phone]

## 2022-01-28 ENCOUNTER — Ambulatory Visit: Payer: Medicare HMO | Admitting: Family Medicine

## 2022-01-31 DIAGNOSIS — R32 Unspecified urinary incontinence: Secondary | ICD-10-CM | POA: Diagnosis not present

## 2022-02-01 ENCOUNTER — Other Ambulatory Visit: Payer: Self-pay | Admitting: *Deleted

## 2022-02-01 NOTE — Patient Outreach (Deleted)
  Care Coordination   02/01/2022  Name: Evan [name] MRN: [id number] DOB: January 19, 1928   Care Coordination Outreach Attempts:  A third unsuccessful outreach was attempted today to offer the patient with information about available care coordination services as a benefit of their health plan. HIPAA compliant messages left on voicemail, providing contact information for CSW, encouraging patient to return CSW's call at his earliest convenience.  Follow Up Plan:  No further outreach attempts will be made at this time. We have been unable to contact the patient to offer or enroll patient in care coordination services.  Encounter Outcome:  No Answer. {THN Tip this will not be part of the note when signed-REQUIRED REPORT FIELD DO NOT DELETE (Optional):27901}  Care Coordination Interventions Activated:  No.  {THN Tip this will not be part of the note when signed-REQUIRED REPORT FIELD DO NOT DELETE (Optional):27901}  Care Coordination Interventions:  No, not indicated.  {THN Tip this will not be part of the note when signed-REQUIRED REPORT FIELD DO NOT DELETE (Optional):27901}  Nat Christen, BSW, MSW, [employer] [facility]  Licensed Clinical Social Worker  [place]  Mailing [place] [address], [place], [place] [address] Physical Address[address], [place], [place] [address] Toll Free Main # [phone] Fax # [phone] Cell # [phone] [email or web address]

## 2022-02-01 NOTE — Patient Outreach (Signed)
  Care Coordination   02/01/2022  Name: Evan [name] MRN: [id number] DOB: [birth year]   Care Coordination Outreach Attempts:  An unsuccessful telephone outreach was attempted today to offer the patient information about available care coordination services as a benefit of their health plan. HIPAA compliant messages left on voicemail, providing contact information for CSW, encouraging patient to return CSW's call at his earliest convenience.  Follow Up Plan:  Additional outreach attempts will be made to offer the patient care coordination information and services.   Encounter Outcome:  No Answer.    Care Coordination Interventions Activated:  No.     Care Coordination Interventions:  No, not indicated.     Nat Christen, BSW, MSW, LCSW  Licensed Education officer, [facility]  Mailing [place] [address], [place], [place] [address] Physical [address] [place], [place] [address] Toll Free Main # [phone] Fax # [phone] Cell # [phone] [email or web address]

## 2022-02-04 ENCOUNTER — Telehealth: Payer: Self-pay | Admitting: *Deleted

## 2022-02-04 NOTE — Patient Outreach (Signed)
  Care Coordination   02/04/2022 Name: Evan [name] MRN: [id number] DOB: [birth year]   Care Coordination Outreach Attempts:  A second unsuccessful outreach was attempted today to offer the patient with information about available care coordination services as a benefit of their health plan.     Follow Up Plan:  Additional outreach attempts will be made to offer the patient care coordination information and services.   Encounter Outcome:  No Answer  Care Coordination Interventions Activated:  No   Care Coordination Interventions:  No, not indicated    SIG Teddy Pena C. Myrtie Neither, MSN, [facility] Gerontological Nurse Practitioner [facility] Care Management [phone]

## 2022-02-06 ENCOUNTER — Other Ambulatory Visit: Payer: Self-pay | Admitting: Interventional Cardiology

## 2022-02-07 NOTE — Telephone Encounter (Signed)
Prescription refill request for Xarelto received.  Indication:afib Last office visit:7/23 Weight:70.8 kg Age:88 Scr:1.4 CrCl:33.71  ml/min  Prescription refilled

## 2022-02-10 ENCOUNTER — Encounter: Payer: Self-pay | Admitting: Family Medicine

## 2022-02-10 ENCOUNTER — Ambulatory Visit (INDEPENDENT_AMBULATORY_CARE_PROVIDER_SITE_OTHER): Payer: Medicare HMO | Admitting: Family Medicine

## 2022-02-10 ENCOUNTER — Ambulatory Visit: Payer: Medicare HMO | Admitting: Family Medicine

## 2022-02-10 VITALS — BP 118/52 | HR 62 | Temp 97.7°F | Ht 63.0 in | Wt 156.0 lb

## 2022-02-10 DIAGNOSIS — R2689 Other abnormalities of gait and mobility: Secondary | ICD-10-CM

## 2022-02-10 DIAGNOSIS — R0689 Other abnormalities of breathing: Secondary | ICD-10-CM | POA: Diagnosis not present

## 2022-02-10 DIAGNOSIS — I69351 Hemiplegia and hemiparesis following cerebral infarction affecting right dominant side: Secondary | ICD-10-CM | POA: Diagnosis not present

## 2022-02-10 DIAGNOSIS — T17908A Unspecified foreign body in respiratory tract, part unspecified causing other injury, initial encounter: Secondary | ICD-10-CM

## 2022-02-10 MED ORDER — AMOXICILLIN-POT CLAVULANATE 875-125 MG PO TABS: 1.0000 | ORAL_TABLET | Freq: Two times a day (BID) | ORAL | 0 refills | Status: DC

## 2022-02-10 NOTE — Progress Notes (Signed)
Subjective:    Patient ID: Evan [name], male    DOB: December 17, 1927, 88 y.o.   MRN: [id number]  Had echo 1/23: 1. LVEF is mildly depressed with lateral hypokinesis. Changes are new  from echo done in 2020.Marland Kitchen Left ventricular ejection fraction, by  estimation, is 45 to 50%. The left ventricle has severely decreased  function. There is mild left ventricular  hypertrophy. Left ventricular diastolic parameters are indeterminate.   2. Right ventricular systolic function is mildly reduced. The right  ventricular size is mildly enlarged. There is normal pulmonary artery  systolic pressure.   3. Left atrial size was moderately dilated.   4. Right atrial size was moderately dilated.   5. The mitral valve is normal in structure. Mild mitral valve  regurgitation.   6. AV is thickened, calcified with restricted motion Peak and mean  gradients throgh the valve are 13 and 7 mm Hg respectively. AVA (VTI) is  1.9 cm2. Dimensionless index is 0.42 consistent with mild AS.Marland Kitchen The aortic  valve is tricuspid. Aortic valve  regurgitation is not visualized.   7. The inferior vena cava is dilated in size with >50% respiratory  variability, suggesting right atrial pressure of 8 mmHg.   Patient's daughter states that for the last few weeks they have heard a rattle in the patient's chest.  He is getting more winded with activity.  In February he had an x-ray that showed interstitial edema.  He has a history of congestive heart failure.  Today on exam, he is sitting comfortably in his wheelchair.  His daughter states that with activity he is more easily winded and she hears a "rattle in his chest that causes him to cough.  She also believes that he has been aspirating.  She states that he coughs whenever he drinks liquids.  On exam today, his left lung is relatively clear however he has prominent crackles in his right lung.  Some of this is chronic and due to scar tissue from previous radiation however this is more  pronounced than his previous exam and raises the concern about possible aspiration pneumonia in his right lung.  They are also requesting physical therapy as he has been losing his balance more readily and having a difficult time using his walker. 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   Anginal chest pain at rest    Chronic non, controlled on Lorazepam   Anxiety    Arthritis    "all over"    BPH (benign prostatic hyperplasia)    CAD (coronary artery disease)    Chronic lower back pain    Complication of anesthesia    "problems making water afterwards"   Depression    DJD (degenerative joint disease)    Esophageal ulcer without bleeding    bid ppi indefinitely   Family history of adverse reaction to anesthesia    "children w/PONV"   GERD (gastroesophageal reflux disease)    Headache    "couple/week maybe" (09/04/2015)   Hemochromatosis    Possible, elevated iron stores, hook-like osteophytes on hand films, normal LFTs   Hepatitis    "yellow jaundice as a baby"   History of ASCVD    MULTIVESSEL   Hyperlipidemia    Hypertension    Mild aortic stenosis 06/23/2021   Osteoarthritis    Pneumonia    "several times; got a little now" (09/04/2015)   Rheumatoid arthritis ([place])    Subdural hematoma ([place])  small after fall 09/2016-plavix held, neurosurgery consulted.  Asymptomatic.     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 since he failed PO Eliquis   Past Surgical History:  Procedure Laterality Date   ARM SKIN LESION BIOPSY / EXCISION Left 09/02/2015   CATARACT EXTRACTION W/ INTRAOCULAR LENS  IMPLANT, BILATERAL Bilateral    CHOLECYSTECTOMY OPEN     CORNEAL TRANSPLANT Bilateral    "one at [facility]; one at [facility]"   [place] W/ STENT     DE stent ostium into the right radial free graft at [facility]-, 02-2007    ESOPHAGOGASTRODUODENOSCOPY N/A 11/09/2014   Procedure: ESOPHAGOGASTRODUODENOSCOPY (EGD);  Surgeon: [name], MD;  Location: [facility] ENDOSCOPY;  Service: Endoscopy;  Laterality: N/A;   INGUINAL HERNIA REPAIR     JOINT REPLACEMENT     LEFT HEART CATH AND CORS/GRAFTS ANGIOGRAPHY N/A 06/27/2017   Procedure: LEFT HEART CATH AND CORS/GRAFTS ANGIOGRAPHY;  Surgeon: [name], MD;  Location: [facility] CV LAB;  Service: Cardiovascular;  Laterality: N/A;   NASAL SINUS SURGERY     SHOULDER OPEN ROTATOR CUFF REPAIR Right    TOTAL KNEE ARTHROPLASTY Bilateral    Current Outpatient Medications on File Prior to Visit  Medication Sig Dispense Refill   acetaminophen (TYLENOL) 325 MG tablet Take 1-2 tablets (325-650 mg total) by mouth every 4 (four) hours as needed for mild pain.     ALPRAZolam (XANAX) 0.5 MG tablet Take 0.5 tablets (0.25 mg total) by mouth 3 (three) times daily as needed for anxiety. (Patient not taking: Reported on 01/05/2022) 30 tablet 0   cycloSPORINE (RESTASIS) 0.05 % ophthalmic emulsion Place 1 drop into affected  eye 2 (two) times daily. (Patient not taking: Reported on 01/05/2022) 60 each 6   fluconazole (DIFLUCAN) 150 MG tablet Take 1 tablet (150 mg total) by mouth once a week. 4 tablet 0   FLUoxetine (PROZAC) 20 MG capsule TAKE 1 CAPSULE EVERY DAY 90 capsule 0   furosemide (LASIX) 20 MG tablet Take 1 tablet (20 mg total) by mouth daily. 90 tablet 3   Lifitegrast (XIIDRA) 5 % SOLN Place 1 drop into both eyes 2 (two) times daily. 60 each 12   magnesium oxide (MAG-OX) 400 MG tablet Take 400 mg by mouth 2 (two) times daily. (Patient not taking: Reported on 01/05/2022)     metoprolol tartrate (LOPRESSOR) 25 MG tablet TAKE 1/2 TABLET TWICE DAILY 90 tablet 3   nitroGLYCERIN (NITROSTAT) 0.4 MG SL tablet Place 1 tablet (0.4 mg total) under the tongue every 5 (five) minutes as needed for chest pain. 25 tablet 2   pantoprazole (PROTONIX) 40 MG tablet TAKE 1 TABLET EVERY DAY 90 tablet 3    polyethylene glycol (MIRALAX / GLYCOLAX) 17 g packet Take 17 g by mouth daily as needed for mild constipation. 14 each 0   rosuvastatin (CRESTOR) 5 MG tablet TAKE 1 TABLET EVERY DAY 90 tablet 1   tacrolimus (PROTOPIC) 0.1 % ointment Apply 1 application topically 2 (two) times daily. 100 g 3   tamsulosin (FLOMAX) 0.4 MG CAPS capsule TAKE 1 CAPSULE EVERY DAY 90 capsule 10   XARELTO 15 MG TABS tablet TAKE 1 TABLET EVERY DAY WITH SUPPER 30 tablet 10   No current facility-administered medications on file prior to visit.   Allergies  Allergen Reactions   Feldene [Piroxicam] Other (See Comments)    REACTION: Blisters  Neomycin-Polymyxin-Hc Itching   Statins Other (See Comments)    Myalgias   Doxycycline Other (See Comments)    REACTION:  unknown   Latex Rash   Tequin Anxiety   Valium Other (See Comments)    REACTION:  unknown   Social History   Socioeconomic History   Marital status: Widowed    Spouse name: Not on file   Number of children: Not on file   Years of education: Not on file   Highest education level: Not on file  Occupational History   Not on file  Tobacco Use   Smoking status: Never    Passive exposure: Yes   Smokeless tobacco: Former    Types: Chew   Tobacco comments:    "quit chewing in the 1960s"  Vaping Use   Vaping Use: Never used  Substance and Sexual Activity   Alcohol use: No   Drug use: No   Sexual activity: Never  Other Topics Concern   Not on file  Social History Narrative   Not on file   Social Determinants of Health   Financial Resource Strain: Low Risk  (05/07/2021)   Overall Financial Resource Strain (CARDIA)    Difficulty of Paying Living Expenses: Not hard at all  Food Insecurity: No Food Insecurity (05/07/2021)   Hunger Vital Sign    Worried About Running Out of Food in the Last Year: Never true    [place] in the Last Year: Never true  Transportation Needs: No Transportation Needs (05/07/2021)   PRAPARE - Civil engineer, contracting (Medical): No    Lack of Transportation (Non-Medical): No  Physical Activity: Insufficiently Active (05/07/2021)   Exercise Vital Sign    Days of Exercise per Week: 3 days    Minutes of Exercise per Session: 20 min  Stress: No Stress Concern Present (05/07/2021)   [place]    Feeling of Stress : Not at all  Social Connections: Socially Isolated (05/07/2021)   Social Connection and Isolation Panel [NHANES]    Frequency of Communication with Friends and Family: More than three times a week    Frequency of Social Gatherings with Friends and Family: More than three times a week    Attends Religious Services: Never    Marine scientist or Organizations: No    Attends Archivist Meetings: Never    Marital Status: Widowed  Intimate Partner Violence: Not At Risk (05/07/2021)   Humiliation, Afraid, Rape, and Kick questionnaire    Fear of Current or Ex-Partner: No    Emotionally Abused: No    Physically Abused: No    Sexually Abused: No      Review of Systems     Objective:   Physical Exam Vitals reviewed.  Constitutional:      General: He is not in acute distress.    Appearance: Normal appearance. He is not toxic-appearing or diaphoretic.  Cardiovascular:     Rate and Rhythm: Normal rate and regular rhythm.     Heart sounds: Normal heart sounds. No murmur heard.    No friction rub. No gallop.  Pulmonary:     Effort: Pulmonary effort is normal. No respiratory distress.     Breath sounds: Rales present. No wheezing or rhonchi.    Musculoskeletal:     Right lower leg: No edema.     Left lower leg: No edema.  Neurological:     Mental Status: He  is alert.   Right-sided hemiparesis with weakness in his right leg.  Weakness with knee flexion and extension and ankle flexion and extension.  Ataxia in the right upper arm and right lower leg.         Assessment & Plan:  Aspiration into  airway, initial encounter - Plan: SLP modified barium swallow  Abnormal breath sounds - Plan: SLP modified barium swallow  Poor balance - Plan: Ambulatory referral to Physical Therapy  Hemiparesis affecting right side as late effect of stroke ([place]) - Plan: Ambulatory referral to Physical Therapy Patient has a history of chronic crackles in the right base due to scar tissue from his previous radiation however this is pronounced.  Given his possible aspiration could consider aspiration pneumonia.  Recommended a chest x-ray as well as Augmentin 875 mg twice daily for 10 days.  Consult speech therapy for barium swallow to evaluate for aspiration.  Consult physical therapy given his weakness and trouble walking and using his walker.  Daughter also mentions that he has been having spasticity and myoclonic jerks in his right upper extremity.  We discussed whether this could be seizure activity but they state that recurs almost on a daily basis and last for a few seconds and then stops.  They think it could be spasticity and cramps as a cause of the patient pain.  We discussed trying baclofen but we want to try physical therapy first to avoid sedation

## 2022-02-11 ENCOUNTER — Other Ambulatory Visit (HOSPITAL_COMMUNITY): Payer: Self-pay | Admitting: *Deleted

## 2022-02-11 DIAGNOSIS — R1310 Dysphagia, unspecified: Secondary | ICD-10-CM

## 2022-02-11 DIAGNOSIS — R3915 Urgency of urination: Secondary | ICD-10-CM | POA: Diagnosis not present

## 2022-02-15 ENCOUNTER — Ambulatory Visit (HOSPITAL_COMMUNITY)
Admission: RE | Admit: 2022-02-15 | Discharge: 2022-02-15 | Disposition: A | Discharge status: Home / Self Care | Payer: Medicare HMO | Source: Ambulatory Visit | Attending: Family Medicine | Admitting: Family Medicine

## 2022-02-15 ENCOUNTER — Ambulatory Visit (INDEPENDENT_AMBULATORY_CARE_PROVIDER_SITE_OTHER): Payer: Medicare HMO | Admitting: Family Medicine

## 2022-02-15 ENCOUNTER — Other Ambulatory Visit: Payer: Self-pay | Admitting: Family Medicine

## 2022-02-15 ENCOUNTER — Encounter: Payer: Self-pay | Admitting: Family Medicine

## 2022-02-15 ENCOUNTER — Ambulatory Visit: Payer: Self-pay

## 2022-02-15 VITALS — BP 100/60 | HR 76 | Temp 97.6°F | Ht 63.0 in | Wt 156.0 lb

## 2022-02-15 DIAGNOSIS — J9811 Atelectasis: Secondary | ICD-10-CM | POA: Diagnosis not present

## 2022-02-15 DIAGNOSIS — J90 Pleural effusion, not elsewhere classified: Secondary | ICD-10-CM | POA: Diagnosis not present

## 2022-02-15 DIAGNOSIS — R042 Hemoptysis: Secondary | ICD-10-CM

## 2022-02-15 LAB — CBC WITH DIFFERENTIAL/PLATELET
Absolute Monocytes: 647 cells/uL (ref 200–950)
Basophils Absolute: 18 cells/uL (ref 0–200)
Basophils Relative: 0.3 %
Eosinophils Absolute: 43 cells/uL (ref 15–500)
Eosinophils Relative: 0.7 %
HCT: 32 % — ABNORMAL LOW (ref 38.5–50.0)
Hemoglobin: 10.7 g/dL — ABNORMAL LOW (ref 13.2–17.1)
Lymphs Abs: 1299 cells/uL (ref 850–3900)
MCH: 32.1 pg (ref 27.0–33.0)
MCHC: 33.4 g/dL (ref 32.0–36.0)
MCV: 96.1 fL (ref 80.0–100.0)
MPV: 11 fL (ref 7.5–12.5)
Monocytes Relative: 10.6 %
Neutro Abs: 4093 cells/uL (ref 1500–7800)
Neutrophils Relative %: 67.1 %
Platelets: 117 10*3/uL — ABNORMAL LOW (ref 140–400)
RBC: 3.33 10*6/uL — ABNORMAL LOW (ref 4.20–5.80)
RDW: 13.2 % (ref 11.0–15.0)
Total Lymphocyte: 21.3 %
WBC: 6.1 10*3/uL (ref 3.8–10.8)

## 2022-02-15 NOTE — Progress Notes (Signed)
   Acute Office Visit  Subjective:     Patient ID: Evan [name], male    DOB: Nov 02, 1927, 88 y.o.   MRN: [id number]  Chief Complaint  Patient presents with   Follow-up   Cough    Coughing up blood 2x//[employer]//[phone]//PEC RN    Cough Associated symptoms include hemoptysis.   Patient is in today with his daughter who aids in history. She reports he coughed up blood this AM twice described as red, cherry colored thick mucus approx. 2 tsp. He has had a cough since last week and was treated with Amoxicillin. His cough, SOB, and wheeze is improving. Daughter denies fevers, night sweats, weight loss, sick exposures, nausea, vomiting, diarrhea, rhinorrhea, lower extremity pain or swelling, blood in stool or urine.  Review of Systems  Reason unable to perform ROS: see HPI.  Respiratory:  Positive for cough and hemoptysis.         Objective:    BP 100/60   Pulse 76   Temp 97.6 F (36.4 C) (Oral)   Ht 5\' 3"  (1.6 m)   Wt 156 lb (70.8 kg)   SpO2 94%   BMI 27.63 kg/m    Physical Exam Vitals and nursing note reviewed.  Constitutional:      Appearance: Normal appearance.  HENT:     Head: Normocephalic and atraumatic.     Nose: Nose normal.     Mouth/Throat:     Mouth: Mucous membranes are moist.     Pharynx: Oropharynx is clear.  Cardiovascular:     Rate and Rhythm: Normal rate and regular rhythm.     Pulses: Normal pulses.     Heart sounds: Normal heart sounds.  Pulmonary:     Effort: Pulmonary effort is normal.     Breath sounds: Normal breath sounds.  Musculoskeletal:     Right lower leg: No edema.     Left lower leg: No edema.  Skin:    General: Skin is warm and dry.  Neurological:     General: No focal deficit present.     Mental Status: He is alert and oriented to person, place, and time. Mental status is at baseline.  Psychiatric:        Mood and Affect: Mood normal.        Behavior: Behavior normal.     No results found for any visits on  02/15/22.      Assessment & Plan:   1. Hemoptysis No acute signs of bleeding on exam or elsewhere reported. Will obtain STAT CBC and CXR to evaluate for pneumonia. If CXR is unremarkable with obtain CT to rule out mass. Patient and daughter instructed to hold Xarelto until further notice. - DG Chest 2 View; Future - CBC with Differential/Platelet   Return if symptoms worsen or fail to improve.  Rubie Maid, FNP

## 2022-02-15 NOTE — Telephone Encounter (Signed)
  Chief Complaint: Cough with dark sputum and blood Symptoms: above Frequency: today Pertinent Negatives: Patient denies SOB Disposition: [] ED /[] Urgent Care (no appt availability in office) / [x] Appointment(In office/virtual)/ []  [facility]/ [] Home Care/ [] Refused Recommended Disposition /[] [place] [facility]/ []  Follow-up with PCP Additional Notes: Spoke with Daughter [name]. She states that pt is coughing up dark sputum and 2 times there was blood in it. She estimates that blood loss is less than 1 teaspoon.

## 2022-02-15 NOTE — Telephone Encounter (Signed)
Reason for Disposition . [1] Has underlying lung disease (e.g., COPD, chronic bronchitis or emphysema) AND [2] sputum has turned yellow or green in color  Answer Assessment - Initial Assessment Questions 1. ONSET: "When did the cough begin?"      Over 2-3 weeks 2. SEVERITY: "How bad is the cough today?" "Did the blood appear after a coughing spell?"      Less than usual 3. SPUTUM: "Describe the color of your sputum" (none, dry cough; clear, white, yellow, green)     Dark with blood clots 4. HEMOPTYSIS: "How much blood?" (flecks, streaks, tablespoons, etc.)     teaspoon 5. DIFFICULTY BREATHING: "Are you having difficulty breathing?" If Yes, ask: "How bad is it?" (e.g., mild, moderate, severe)    - MILD: No SOB at rest, mild SOB with walking, speaks normally in sentences, can lie down, no retractions, pulse < 100.    - MODERATE: SOB at rest, SOB with minimal exertion and prefers to sit, cannot lie down flat, speaks in phrases, mild retractions, audible wheezing, pulse 100-120.    - SEVERE: Very SOB at rest, speaks in single words, struggling to breathe, sitting hunched forward, retractions, pulse > 120      no 6. FEVER: "Do you have a fever?" If Yes, ask: "What is your temperature, how was it measured, and when did it start?"     no 7. CARDIAC HISTORY: "Do you have any history of heart disease?" (e.g., heart attack, congestive heart failure)      yes 8. LUNG HISTORY: "Do you have any history of lung disease?"  (e.g., pulmonary embolus, asthma, emphysema)     Lung CA 9. PE RISK FACTORS: "Do you have a history of blood clots?" (or: recent major surgery, recent prolonged travel, bedridden)     yes 10. OTHER SYMPTOMS: "Do you have any other symptoms?" (e.g., runny nose, wheezing, chest pain)       no 11. PREGNANCY: "Is there any chance you are pregnant?" "When was your last menstrual period?"       na 12. TRAVEL: "Have you traveled out of the country in the last month?" (e.g., travel history,  exposures)       na  Protocols used: Coughing Up Blood-A-AH

## 2022-02-17 ENCOUNTER — Ambulatory Visit (HOSPITAL_COMMUNITY)
Admission: RE | Admit: 2022-02-17 | Discharge: 2022-02-17 | Disposition: A | Discharge status: Home / Self Care | Payer: Medicare HMO | Source: Ambulatory Visit | Attending: Family Medicine | Admitting: Family Medicine

## 2022-02-17 DIAGNOSIS — R1312 Dysphagia, oropharyngeal phase: Secondary | ICD-10-CM | POA: Diagnosis not present

## 2022-02-17 DIAGNOSIS — Z8673 Personal history of transient ischemic attack (TIA), and cerebral infarction without residual deficits: Secondary | ICD-10-CM | POA: Diagnosis not present

## 2022-02-17 DIAGNOSIS — R1310 Dysphagia, unspecified: Secondary | ICD-10-CM | POA: Diagnosis not present

## 2022-02-17 DIAGNOSIS — R0689 Other abnormalities of breathing: Secondary | ICD-10-CM | POA: Insufficient documentation

## 2022-02-17 DIAGNOSIS — R0989 Other specified symptoms and signs involving the circulatory and respiratory systems: Secondary | ICD-10-CM

## 2022-02-17 DIAGNOSIS — T17908A Unspecified foreign body in respiratory tract, part unspecified causing other injury, initial encounter: Secondary | ICD-10-CM | POA: Diagnosis not present

## 2022-02-17 DIAGNOSIS — R6339 Other feeding difficulties: Secondary | ICD-10-CM | POA: Diagnosis not present

## 2022-02-17 NOTE — Progress Notes (Signed)
Modified Barium Swallow Progress Note  Patient Details  Name: Evan [name] MRN: [id number] Date of Birth: [birth year]  Today's Date: 02/17/2022  Modified Barium Swallow completed.  Full report located under Chart Review in the Imaging Section.  Brief recommendations include the following:  Clinical Impression  Pt has a mild oropharyngeal dysphagia that appears to be relatively consistent with previous MBS from 2020. Suspected soft tissue calcifications noted (discussed with radiology PA) that also appear to be consistent with previous imaging upon review. Pt has mild oral deficits R>L, suspected to be from prior stroke. Anterior loss is observed even with small pieces of solids. He has some reduced bolus cohesion and posterior spillage with thin liquids that reach the pyriform sinuses before the swallow. With sequential cup drinking, sensed aspiration occurred x1 as a result of reduced oral control and spillage that filled the pyrform sinuses, with cough ineffective at clearing the airway (PAS 7). When pt took one sip at a time, penetration was trace and cleared spontaneously (PAS 2), which would not be atypical for age. Pt does have some risk for prandial aspiration, but also could be at risk for post-prandial aspiration in the setting of known esophageal dysphagia as well. He is also someone who could be more predisposed to prandial aspiration in the setting of any other acute illness that could decondition him. Therefore, despite relatively stable swallowing, could consider HH SLP f/u to maximize use of strategies as well as provide education on aspiration and esophageal precautions.   Swallow Evaluation Recommendations       SLP Diet Recommendations: Regular solids;Thin liquid   Liquid Administration via: Cup;Straw   Medication Administration: Whole meds with liquid   Supervision: Patient able to self feed;Full supervision/cueing for compensatory strategies (assist initially in trying to  help slow pt's rate of intake)   Compensations: Minimize environmental distractions;Slow rate;Small sips/bites;Other (Comment) (one sip at a time)   Postural Changes: Seated upright at 90 degrees;Remain semi-upright after after feeds/meals (Comment)   Oral Care Recommendations: Oral care BID        Osie Bond., M.A. [place] Office [phone]  Secure chat preferred  02/17/2022,3:43 PM

## 2022-02-28 ENCOUNTER — Telehealth: Payer: Self-pay | Admitting: *Deleted

## 2022-02-28 NOTE — Patient Outreach (Signed)
  Care Coordination   02/28/2022 Name: Evan [name] MRN: [id number] DOB: April 21, 1927   Care Coordination Outreach Attempts:  A third unsuccessful outreach was attempted today to offer the patient with information about available care coordination services as a benefit of their health plan.  Left a message informing of 3rd outreach attempt. Chart review reveals had been active with [facility] in the past.  Follow Up Plan:  No further outreach attempts will be made at this time. We have been unable to contact the patient to offer or enroll patient in care coordination services  Encounter Outcome:  No Answer   Care Coordination Interventions:  No, not indicated    SIG Nawaal Alling C. Myrtie Neither, MSN, [facility] Gerontological Nurse Practitioner [facility] Care Management [phone]

## 2022-03-02 DIAGNOSIS — R32 Unspecified urinary incontinence: Secondary | ICD-10-CM | POA: Diagnosis not present

## 2022-03-04 ENCOUNTER — Other Ambulatory Visit: Payer: Self-pay

## 2022-03-04 DIAGNOSIS — Z8673 Personal history of transient ischemic attack (TIA), and cerebral infarction without residual deficits: Secondary | ICD-10-CM

## 2022-03-04 DIAGNOSIS — I739 Peripheral vascular disease, unspecified: Secondary | ICD-10-CM

## 2022-03-04 DIAGNOSIS — I4821 Permanent atrial fibrillation: Secondary | ICD-10-CM

## 2022-03-04 DIAGNOSIS — R2689 Other abnormalities of gait and mobility: Secondary | ICD-10-CM

## 2022-03-04 DIAGNOSIS — I5021 Acute systolic (congestive) heart failure: Secondary | ICD-10-CM

## 2022-03-07 ENCOUNTER — Other Ambulatory Visit: Payer: Self-pay

## 2022-03-07 DIAGNOSIS — I2510 Atherosclerotic heart disease of native coronary artery without angina pectoris: Secondary | ICD-10-CM

## 2022-03-07 DIAGNOSIS — J438 Other emphysema: Secondary | ICD-10-CM

## 2022-03-07 DIAGNOSIS — R531 Weakness: Secondary | ICD-10-CM

## 2022-03-07 DIAGNOSIS — R4182 Altered mental status, unspecified: Secondary | ICD-10-CM

## 2022-03-07 DIAGNOSIS — I69351 Hemiplegia and hemiparesis following cerebral infarction affecting right dominant side: Secondary | ICD-10-CM

## 2022-03-12 ENCOUNTER — Emergency Department (HOSPITAL_COMMUNITY): Payer: Medicare HMO

## 2022-03-12 ENCOUNTER — Other Ambulatory Visit: Payer: Self-pay

## 2022-03-12 ENCOUNTER — Encounter (HOSPITAL_COMMUNITY): Payer: Self-pay

## 2022-03-12 ENCOUNTER — Inpatient Hospital Stay (HOSPITAL_COMMUNITY)
Admission: EM | Admit: 2022-03-12 | Discharge: 2022-03-15 | DRG: 194 | Disposition: A | Discharge status: Home Health | Payer: Medicare HMO | Attending: Internal Medicine | Admitting: Internal Medicine

## 2022-03-12 DIAGNOSIS — J449 Chronic obstructive pulmonary disease, unspecified: Secondary | ICD-10-CM | POA: Diagnosis present

## 2022-03-12 DIAGNOSIS — D696 Thrombocytopenia, unspecified: Secondary | ICD-10-CM | POA: Diagnosis not present

## 2022-03-12 DIAGNOSIS — G250 Essential tremor: Secondary | ICD-10-CM | POA: Diagnosis present

## 2022-03-12 DIAGNOSIS — I2510 Atherosclerotic heart disease of native coronary artery without angina pectoris: Secondary | ICD-10-CM | POA: Diagnosis not present

## 2022-03-12 DIAGNOSIS — Z96653 Presence of artificial knee joint, bilateral: Secondary | ICD-10-CM | POA: Diagnosis present

## 2022-03-12 DIAGNOSIS — I350 Nonrheumatic aortic (valve) stenosis: Secondary | ICD-10-CM | POA: Diagnosis present

## 2022-03-12 DIAGNOSIS — N183 Chronic kidney disease, stage 3 unspecified: Secondary | ICD-10-CM | POA: Diagnosis present

## 2022-03-12 DIAGNOSIS — Z85118 Personal history of other malignant neoplasm of bronchus and lung: Secondary | ICD-10-CM | POA: Diagnosis not present

## 2022-03-12 DIAGNOSIS — Z7901 Long term (current) use of anticoagulants: Secondary | ICD-10-CM

## 2022-03-12 DIAGNOSIS — Z86711 Personal history of pulmonary embolism: Secondary | ICD-10-CM

## 2022-03-12 DIAGNOSIS — E785 Hyperlipidemia, unspecified: Secondary | ICD-10-CM | POA: Diagnosis not present

## 2022-03-12 DIAGNOSIS — N3289 Other specified disorders of bladder: Secondary | ICD-10-CM | POA: Diagnosis not present

## 2022-03-12 DIAGNOSIS — K2100 Gastro-esophageal reflux disease with esophagitis, without bleeding: Secondary | ICD-10-CM | POA: Diagnosis not present

## 2022-03-12 DIAGNOSIS — I13 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 failure and stage 1 through stage 4 chronic kidney disease, or unspecified chronic kidney disease: Secondary | ICD-10-CM | POA: Diagnosis present

## 2022-03-12 DIAGNOSIS — F419 Anxiety disorder, unspecified: Secondary | ICD-10-CM | POA: Diagnosis present

## 2022-03-12 DIAGNOSIS — F411 Generalized anxiety disorder: Secondary | ICD-10-CM | POA: Diagnosis not present

## 2022-03-12 DIAGNOSIS — E669 Obesity, unspecified: Secondary | ICD-10-CM | POA: Diagnosis present

## 2022-03-12 DIAGNOSIS — N401 Enlarged prostate with lower urinary tract symptoms: Secondary | ICD-10-CM | POA: Diagnosis not present

## 2022-03-12 DIAGNOSIS — L89152 Pressure ulcer of sacral region, stage 2: Secondary | ICD-10-CM | POA: Diagnosis present

## 2022-03-12 DIAGNOSIS — I69351 Hemiplegia and hemiparesis following cerebral infarction affecting right dominant side: Secondary | ICD-10-CM

## 2022-03-12 DIAGNOSIS — D631 Anemia in chronic kidney disease: Secondary | ICD-10-CM | POA: Diagnosis present

## 2022-03-12 DIAGNOSIS — Z888 Allergy status to other drugs, medicaments and biological substances status: Secondary | ICD-10-CM

## 2022-03-12 DIAGNOSIS — N400 Enlarged prostate without lower urinary tract symptoms: Secondary | ICD-10-CM | POA: Diagnosis present

## 2022-03-12 DIAGNOSIS — M4856XA Collapsed vertebra, not elsewhere classified, lumbar region, initial encounter for fracture: Secondary | ICD-10-CM | POA: Diagnosis present

## 2022-03-12 DIAGNOSIS — Z1152 Encounter for screening for COVID-19: Secondary | ICD-10-CM

## 2022-03-12 DIAGNOSIS — K5730 Diverticulosis of large intestine without perforation or abscess without bleeding: Secondary | ICD-10-CM | POA: Diagnosis not present

## 2022-03-12 DIAGNOSIS — R338 Other retention of urine: Secondary | ICD-10-CM | POA: Diagnosis not present

## 2022-03-12 DIAGNOSIS — Z9889 Other specified postprocedural states: Secondary | ICD-10-CM | POA: Diagnosis not present

## 2022-03-12 DIAGNOSIS — I5020 Unspecified systolic (congestive) heart failure: Secondary | ICD-10-CM | POA: Diagnosis present

## 2022-03-12 DIAGNOSIS — I10 Essential (primary) hypertension: Secondary | ICD-10-CM | POA: Diagnosis not present

## 2022-03-12 DIAGNOSIS — R531 Weakness: Secondary | ICD-10-CM | POA: Diagnosis not present

## 2022-03-12 DIAGNOSIS — Z8249 Family history of ischemic heart disease and other diseases of the circulatory system: Secondary | ICD-10-CM

## 2022-03-12 DIAGNOSIS — S2249XA Multiple fractures of ribs, unspecified side, initial encounter for closed fracture: Secondary | ICD-10-CM | POA: Diagnosis not present

## 2022-03-12 DIAGNOSIS — Z6829 Body mass index (BMI) 29.0-29.9, adult: Secondary | ICD-10-CM

## 2022-03-12 DIAGNOSIS — F32A Depression, unspecified: Secondary | ICD-10-CM | POA: Diagnosis present

## 2022-03-12 DIAGNOSIS — M069 Rheumatoid arthritis, unspecified: Secondary | ICD-10-CM | POA: Diagnosis present

## 2022-03-12 DIAGNOSIS — J90 Pleural effusion, not elsewhere classified: Secondary | ICD-10-CM | POA: Diagnosis not present

## 2022-03-12 DIAGNOSIS — Z951 Presence of aortocoronary bypass graft: Secondary | ICD-10-CM | POA: Diagnosis not present

## 2022-03-12 DIAGNOSIS — Z79899 Other long term (current) drug therapy: Secondary | ICD-10-CM

## 2022-03-12 DIAGNOSIS — R4182 Altered mental status, unspecified: Secondary | ICD-10-CM | POA: Diagnosis not present

## 2022-03-12 DIAGNOSIS — Z955 Presence of coronary angioplasty implant and graft: Secondary | ICD-10-CM

## 2022-03-12 DIAGNOSIS — N1832 Chronic kidney disease, stage 3b: Secondary | ICD-10-CM | POA: Diagnosis present

## 2022-03-12 DIAGNOSIS — I959 Hypotension, unspecified: Secondary | ICD-10-CM | POA: Diagnosis present

## 2022-03-12 DIAGNOSIS — Z8679 Personal history of other diseases of the circulatory system: Secondary | ICD-10-CM

## 2022-03-12 DIAGNOSIS — I2699 Other pulmonary embolism without acute cor pulmonale: Secondary | ICD-10-CM | POA: Diagnosis not present

## 2022-03-12 DIAGNOSIS — I5042 Chronic combined systolic (congestive) and diastolic (congestive) heart failure: Secondary | ICD-10-CM | POA: Diagnosis not present

## 2022-03-12 DIAGNOSIS — R6510 Systemic inflammatory response syndrome (SIRS) of non-infectious origin without acute organ dysfunction: Secondary | ICD-10-CM | POA: Diagnosis not present

## 2022-03-12 DIAGNOSIS — I4891 Unspecified atrial fibrillation: Secondary | ICD-10-CM | POA: Diagnosis not present

## 2022-03-12 DIAGNOSIS — M19011 Primary osteoarthritis, right shoulder: Secondary | ICD-10-CM | POA: Diagnosis not present

## 2022-03-12 DIAGNOSIS — I4821 Permanent atrial fibrillation: Secondary | ICD-10-CM | POA: Diagnosis present

## 2022-03-12 DIAGNOSIS — R918 Other nonspecific abnormal finding of lung field: Secondary | ICD-10-CM | POA: Diagnosis not present

## 2022-03-12 DIAGNOSIS — J189 Pneumonia, unspecified organism: Principal | ICD-10-CM | POA: Diagnosis present

## 2022-03-12 DIAGNOSIS — I739 Peripheral vascular disease, unspecified: Secondary | ICD-10-CM | POA: Diagnosis not present

## 2022-03-12 DIAGNOSIS — R0902 Hypoxemia: Secondary | ICD-10-CM | POA: Diagnosis not present

## 2022-03-12 DIAGNOSIS — Z87891 Personal history of nicotine dependence: Secondary | ICD-10-CM

## 2022-03-12 DIAGNOSIS — K219 Gastro-esophageal reflux disease without esophagitis: Secondary | ICD-10-CM | POA: Diagnosis present

## 2022-03-12 DIAGNOSIS — F339 Major depressive disorder, recurrent, unspecified: Secondary | ICD-10-CM | POA: Diagnosis not present

## 2022-03-12 DIAGNOSIS — N1831 Chronic kidney disease, stage 3a: Secondary | ICD-10-CM | POA: Diagnosis not present

## 2022-03-12 DIAGNOSIS — Z86718 Personal history of other venous thrombosis and embolism: Secondary | ICD-10-CM

## 2022-03-12 DIAGNOSIS — J440 Chronic obstructive pulmonary disease with acute lower respiratory infection: Secondary | ICD-10-CM | POA: Diagnosis not present

## 2022-03-12 DIAGNOSIS — Z91040 Latex allergy status: Secondary | ICD-10-CM

## 2022-03-12 LAB — I-STAT VENOUS BLOOD GAS, ED
Acid-base deficit: 5 mmol/L — ABNORMAL HIGH (ref 0.0–2.0)
Bicarbonate: 17.8 mmol/L — ABNORMAL LOW (ref 20.0–28.0)
Calcium, Ion: 1.06 mmol/L — ABNORMAL LOW (ref 1.15–1.40)
HCT: 33 % — ABNORMAL LOW (ref 39.0–52.0)
Hemoglobin: 11.2 g/dL — ABNORMAL LOW (ref 13.0–17.0)
O2 Saturation: 65 %
Potassium: 4.2 mmol/L (ref 3.5–5.1)
Sodium: 135 mmol/L (ref 135–145)
TCO2: 19 mmol/L — ABNORMAL LOW (ref 22–32)
pCO2, Ven: 24.9 mmHg — ABNORMAL LOW (ref 44–60)
pH, Ven: 7.463 — ABNORMAL HIGH (ref 7.25–7.43)
pO2, Ven: 31 mmHg — CL (ref 32–45)

## 2022-03-12 LAB — COMPREHENSIVE METABOLIC PANEL
ALT: 8 U/L (ref 0–44)
AST: 23 U/L (ref 15–41)
Albumin: 3.6 g/dL (ref 3.5–5.0)
Alkaline Phosphatase: 94 U/L (ref 38–126)
Anion gap: 13 (ref 5–15)
BUN: 18 mg/dL (ref 8–23)
CO2: 18 mmol/L — ABNORMAL LOW (ref 22–32)
Calcium: 9 mg/dL (ref 8.9–10.3)
Chloride: 104 mmol/L (ref 98–111)
Creatinine, Ser: 1.53 mg/dL — ABNORMAL HIGH (ref 0.61–1.24)
GFR, Estimated: 42 mL/min — ABNORMAL LOW (ref >60)
Glucose, Bld: 115 mg/dL — ABNORMAL HIGH (ref 70–99)
Potassium: 4.3 mmol/L (ref 3.5–5.1)
Sodium: 135 mmol/L (ref 135–145)
Total Bilirubin: 1.6 mg/dL — ABNORMAL HIGH (ref 0.3–1.2)
Total Protein: 7.2 g/dL (ref 6.5–8.1)

## 2022-03-12 LAB — CBC WITH DIFFERENTIAL/PLATELET
Abs Immature Granulocytes: 0.08 10*3/uL — ABNORMAL HIGH (ref 0.00–0.07)
Basophils Absolute: 0 10*3/uL (ref 0.0–0.1)
Basophils Relative: 0 %
Eosinophils Absolute: 0 10*3/uL (ref 0.0–0.5)
Eosinophils Relative: 0 %
HCT: 32.8 % — ABNORMAL LOW (ref 39.0–52.0)
Hemoglobin: 10.5 g/dL — ABNORMAL LOW (ref 13.0–17.0)
Immature Granulocytes: 1 %
Lymphocytes Relative: 5 %
Lymphs Abs: 0.5 10*3/uL — ABNORMAL LOW (ref 0.7–4.0)
MCH: 31.9 pg (ref 26.0–34.0)
MCHC: 32 g/dL (ref 30.0–36.0)
MCV: 99.7 fL (ref 80.0–100.0)
Monocytes Absolute: 0.9 10*3/uL (ref 0.1–1.0)
Monocytes Relative: 8 %
Neutro Abs: 9.7 10*3/uL — ABNORMAL HIGH (ref 1.7–7.7)
Neutrophils Relative %: 86 %
Platelets: 118 10*3/uL — ABNORMAL LOW (ref 150–400)
RBC: 3.29 MIL/uL — ABNORMAL LOW (ref 4.22–5.81)
RDW: 14.3 % (ref 11.5–15.5)
WBC: 11.2 10*3/uL — ABNORMAL HIGH (ref 4.0–10.5)
nRBC: 0 % (ref 0.0–0.2)

## 2022-03-12 LAB — I-STAT CHEM 8, ED
BUN: 18 mg/dL (ref 8–23)
Calcium, Ion: 1.05 mmol/L — ABNORMAL LOW (ref 1.15–1.40)
Chloride: 103 mmol/L (ref 98–111)
Creatinine, Ser: 1.4 mg/dL — ABNORMAL HIGH (ref 0.61–1.24)
Glucose, Bld: 113 mg/dL — ABNORMAL HIGH (ref 70–99)
HCT: 33 % — ABNORMAL LOW (ref 39.0–52.0)
Hemoglobin: 11.2 g/dL — ABNORMAL LOW (ref 13.0–17.0)
Potassium: 4.2 mmol/L (ref 3.5–5.1)
Sodium: 135 mmol/L (ref 135–145)
TCO2: 19 mmol/L — ABNORMAL LOW (ref 22–32)

## 2022-03-12 LAB — PROTIME-INR
INR: 2.4 — ABNORMAL HIGH (ref 0.8–1.2)
Prothrombin Time: 25.8 seconds — ABNORMAL HIGH (ref 11.4–15.2)

## 2022-03-12 LAB — LACTIC ACID, PLASMA
Lactic Acid, Venous: 2.3 mmol/L (ref 0.5–1.9)
Lactic Acid, Venous: 1.8 mmol/L (ref 0.5–1.9)

## 2022-03-12 LAB — URINALYSIS, ROUTINE W REFLEX MICROSCOPIC
Bacteria, UA: NONE SEEN
Bilirubin Urine: NEGATIVE
Glucose, UA: NEGATIVE mg/dL
Ketones, ur: 5 mg/dL — AB
Leukocytes,Ua: NEGATIVE
Nitrite: NEGATIVE
Protein, ur: NEGATIVE mg/dL
Specific Gravity, Urine: 1.013 (ref 1.005–1.030)
pH: 7 (ref 5.0–8.0)

## 2022-03-12 LAB — MAGNESIUM: Magnesium: 1.9 mg/dL (ref 1.7–2.4)

## 2022-03-12 LAB — RESP PANEL BY RT-PCR (RSV, FLU A&B, COVID)  RVPGX2
Influenza A by PCR: NEGATIVE
Influenza B by PCR: NEGATIVE
Resp Syncytial Virus by PCR: NEGATIVE
SARS Coronavirus 2 by RT PCR: NEGATIVE

## 2022-03-12 LAB — TROPONIN I (HIGH SENSITIVITY)
Troponin I (High Sensitivity): 23 ng/L — ABNORMAL HIGH (ref <18)
Troponin I (High Sensitivity): 28 ng/L — ABNORMAL HIGH (ref <18)

## 2022-03-12 MED ORDER — TAMSULOSIN HCL 0.4 MG PO CAPS
0.4000 mg | ORAL_CAPSULE | Freq: Every day | ORAL | Status: DC
  Administered 2022-03-13 – 2022-03-15 (×3): 0.4 mg via ORAL
  Filled 2022-03-12 (×3): qty 1

## 2022-03-12 MED ORDER — ROSUVASTATIN CALCIUM 5 MG PO TABS
5.0000 mg | ORAL_TABLET | Freq: Every day | ORAL | Status: DC
  Administered 2022-03-13 – 2022-03-15 (×3): 5 mg via ORAL
  Filled 2022-03-12 (×3): qty 1

## 2022-03-12 MED ORDER — POLYETHYLENE GLYCOL 3350 17 G PO PACK: 17.0000 g | PACK | Freq: Every day | ORAL | Status: DC | PRN

## 2022-03-12 MED ORDER — ACETAMINOPHEN 325 MG PO TABS
650.0000 mg | ORAL_TABLET | Freq: Once | ORAL | Status: AC
  Administered 2022-03-12: 650 mg via ORAL
  Filled 2022-03-12: qty 2

## 2022-03-12 MED ORDER — METOPROLOL TARTRATE 12.5 MG HALF TABLET
12.5000 mg | ORAL_TABLET | Freq: Every day | ORAL | Status: DC
  Administered 2022-03-13 – 2022-03-15 (×3): 12.5 mg via ORAL
  Filled 2022-03-12 (×3): qty 1

## 2022-03-12 MED ORDER — SODIUM CHLORIDE 0.9 % IV SOLN
1.0000 g | INTRAVENOUS | Status: DC
  Administered 2022-03-12 – 2022-03-14 (×3): 1 g via INTRAVENOUS
  Filled 2022-03-12 (×3): qty 10

## 2022-03-12 MED ORDER — SODIUM CHLORIDE 0.9% FLUSH
3.0000 mL | Freq: Two times a day (BID) | INTRAVENOUS | Status: DC
  Administered 2022-03-12 – 2022-03-15 (×3): 3 mL via INTRAVENOUS

## 2022-03-12 MED ORDER — LACTATED RINGERS IV SOLN: INTRAVENOUS | Status: AC

## 2022-03-12 MED ORDER — VANCOMYCIN HCL 1500 MG/300ML IV SOLN
1500.0000 mg | Freq: Once | INTRAVENOUS | Status: AC
  Administered 2022-03-12: 1500 mg via INTRAVENOUS
  Filled 2022-03-12: qty 300

## 2022-03-12 MED ORDER — SODIUM CHLORIDE 0.9 % IV SOLN
2.0000 g | Freq: Once | INTRAVENOUS | Status: AC
  Administered 2022-03-12: 2 g via INTRAVENOUS
  Filled 2022-03-12: qty 12.5

## 2022-03-12 MED ORDER — METRONIDAZOLE 500 MG/100ML IV SOLN
500.0000 mg | Freq: Once | INTRAVENOUS | Status: AC
  Administered 2022-03-12: 500 mg via INTRAVENOUS
  Filled 2022-03-12: qty 100

## 2022-03-12 MED ORDER — RIVAROXABAN 15 MG PO TABS
15.0000 mg | ORAL_TABLET | Freq: Every day | ORAL | Status: DC
  Administered 2022-03-13 – 2022-03-14 (×2): 15 mg via ORAL
  Filled 2022-03-12 (×4): qty 1

## 2022-03-12 MED ORDER — ACETAMINOPHEN 325 MG PO TABS
650.0000 mg | ORAL_TABLET | Freq: Four times a day (QID) | ORAL | Status: DC | PRN
  Administered 2022-03-14 (×2): 650 mg via ORAL
  Filled 2022-03-12 (×2): qty 2

## 2022-03-12 MED ORDER — LACTATED RINGERS IV BOLUS (SEPSIS)
1000.0000 mL | Freq: Once | INTRAVENOUS | Status: AC
  Administered 2022-03-12: 1000 mL via INTRAVENOUS

## 2022-03-12 MED ORDER — VANCOMYCIN HCL IN DEXTROSE 1-5 GM/200ML-% IV SOLN: 1000.0000 mg | Freq: Once | INTRAVENOUS | Status: DC

## 2022-03-12 MED ORDER — SODIUM CHLORIDE 0.9 % IV SOLN
500.0000 mg | INTRAVENOUS | Status: DC
  Administered 2022-03-12 – 2022-03-14 (×3): 500 mg via INTRAVENOUS
  Filled 2022-03-12 (×4): qty 5

## 2022-03-12 MED ORDER — ACETAMINOPHEN 650 MG RE SUPP: 650.0000 mg | Freq: Four times a day (QID) | RECTAL | Status: DC | PRN

## 2022-03-12 MED ORDER — PANTOPRAZOLE SODIUM 40 MG PO TBEC
40.0000 mg | DELAYED_RELEASE_TABLET | Freq: Every day | ORAL | Status: DC
  Administered 2022-03-13 – 2022-03-15 (×3): 40 mg via ORAL
  Filled 2022-03-12 (×3): qty 1

## 2022-03-12 MED ORDER — FLUOXETINE HCL 20 MG PO CAPS
20.0000 mg | ORAL_CAPSULE | Freq: Every day | ORAL | Status: DC
  Administered 2022-03-13 – 2022-03-15 (×3): 20 mg via ORAL
  Filled 2022-03-12 (×3): qty 1

## 2022-03-12 NOTE — Sepsis Progress Note (Signed)
Sepsis protocol is being followed by [facility]. 

## 2022-03-12 NOTE — ED Provider Notes (Signed)
[facility] EMERGENCY DEPARTMENT Provider Note   CSN: [id number] Arrival date & time: 03/12/22  1112     History  Chief Complaint  Patient presents with   Weakness    Evan [name] is a 88 y.o. male.   Weakness Associated symptoms: dysuria   Dysuria Presenting symptoms: dysuria   Patient presents for generalized weakness.  Medical history includes PVD, GERD, HLD, HTN, arthritis, asthma, CAD, anxiety, BPH, prior PE, prior ICH, CKD, aortic stenosis.  At baseline, patient is alert and oriented.  He is able to ambulate with assistance.  He currently lives with his daughter-in-law.  His daughter lives next-door to him.  Family noticed him shaking at dinnertime last night.  This seemed to worsen throughout the night.  He complained of chills.  This morning, shaking continued and patient seems severely weak.  He was unable to stand even with assistance.  For this reason, he presents to the ED via EMS.  EMS noted foul odor to urine.  Patient told EMS that he has had dysuria.  Currently, patient denies any areas of discomfort.     Home Medications Prior to Admission medications   Medication Sig Start Date End Date Taking? Authorizing Provider  acetaminophen (TYLENOL) 325 MG tablet Take 1-2 tablets (325-650 mg total) by mouth every 4 (four) hours as needed for mild pain. 08/09/18   Love, Ivan Anchors, PA-C  amoxicillin-clavulanate (AUGMENTIN) 875-125 MG tablet Take 1 tablet by mouth 2 (two) times daily. 02/10/22   [name], MD  FLUoxetine (PROZAC) 20 MG capsule TAKE 1 CAPSULE EVERY DAY 01/05/22   [name], MD  furosemide (LASIX) 20 MG tablet Take 1 tablet (20 mg total) by mouth daily. 11/01/21   Jettie Booze, MD  Lifitegrast Shirley Friar) 5 % SOLN Place 1 drop into both eyes 2 (two) times daily. 12/01/21     metoprolol tartrate (LOPRESSOR) 25 MG tablet TAKE 1/2 TABLET TWICE DAILY Patient taking differently: Take 12.5 mg by mouth daily. 08/23/21   [name],  MD  nitroGLYCERIN (NITROSTAT) 0.4 MG SL tablet Place 1 tablet (0.4 mg total) under the tongue every 5 (five) minutes as needed for chest pain. 02/01/21   [name], MD  pantoprazole (PROTONIX) 40 MG tablet TAKE 1 TABLET EVERY DAY 04/21/21   [name], MD  polyethylene glycol (MIRALAX / GLYCOLAX) 17 g packet Take 17 g by mouth daily as needed for mild constipation. 08/14/18   Angiulli, Lavon Paganini, PA-C  rosuvastatin (CRESTOR) 5 MG tablet TAKE 1 TABLET EVERY DAY 08/16/21   [name], PA-C  tacrolimus (PROTOPIC) 0.1 % ointment Apply 1 application topically 2 (two) times daily. 11/10/20   [name], MD  tamsulosin (FLOMAX) 0.4 MG CAPS capsule TAKE 1 CAPSULE EVERY DAY 01/10/22   [name], MD  XARELTO 15 MG TABS tablet TAKE 1 TABLET EVERY DAY WITH SUPPER 02/07/22   Jettie Booze, MD      Allergies    Feldene [piroxicam], Neomycin-polymyxin-hc, Statins, Valium [diazepam], Latex, Tequin [gatifloxacin], and Vibra-tab [doxycycline]    Review of Systems   Review of Systems  Constitutional:  Positive for chills.  Genitourinary:  Positive for dysuria.  Neurological:  Positive for weakness (Generalized).  All other systems reviewed and are negative.   Physical Exam Updated Vital Signs BP 108/62   Pulse 76   Temp 99.2 F (37.3 C) (Oral)   Resp 20   SpO2 99%  Physical Exam Vitals and  nursing note reviewed.  Constitutional:      General: He is not in acute distress.    Appearance: He is well-developed and normal weight. He is ill-appearing. He is not toxic-appearing or diaphoretic.  HENT:     Head: Normocephalic and atraumatic.     Right Ear: External ear normal.     Left Ear: External ear normal.     Nose: Nose normal.     Mouth/Throat:     Mouth: Mucous membranes are dry.  Eyes:     Extraocular Movements: Extraocular movements intact.     Conjunctiva/sclera: Conjunctivae normal.  Cardiovascular:     Rate and Rhythm: Normal rate and regular rhythm.      Heart sounds: No murmur heard. Pulmonary:     Effort: Pulmonary effort is normal. Tachypnea present. No respiratory distress.     Breath sounds: Normal breath sounds. No wheezing or rales.  Chest:     Chest wall: No tenderness.  Abdominal:     General: There is no distension.     Palpations: Abdomen is soft.     Tenderness: There is no abdominal tenderness.  Musculoskeletal:        General: No swelling. Normal range of motion.     Cervical back: Normal range of motion and neck supple. No rigidity.     Right lower leg: No edema.     Left lower leg: No edema.  Skin:    General: Skin is warm and dry.     Capillary Refill: Capillary refill takes less than 2 seconds.     Coloration: Skin is not jaundiced or pale.     Comments: Wound to right forearm, dressing in place  Neurological:     General: No focal deficit present.     Mental Status: He is alert. He is disoriented.     Cranial Nerves: No cranial nerve deficit.     Sensory: No sensory deficit.     Motor: No weakness.  Psychiatric:        Mood and Affect: Mood normal.        Behavior: Behavior normal.     ED Results / Procedures / Treatments   Labs (all labs ordered are listed, but only abnormal results are displayed) Labs Reviewed  LACTIC ACID, PLASMA - Abnormal; Notable for the following components:      Result Value   Lactic Acid, Venous 2.3 (*)    All other components within normal limits  COMPREHENSIVE METABOLIC PANEL - Abnormal; Notable for the following components:   CO2 18 (*)    Glucose, Bld 115 (*)    Creatinine, Ser 1.53 (*)    Total Bilirubin 1.6 (*)    GFR, Estimated 42 (*)    All other components within normal limits  CBC WITH DIFFERENTIAL/PLATELET - Abnormal; Notable for the following components:   WBC 11.2 (*)    RBC 3.29 (*)    Hemoglobin 10.5 (*)    HCT 32.8 (*)    Platelets 118 (*)    Neutro Abs 9.7 (*)    Lymphs Abs 0.5 (*)    Abs Immature Granulocytes 0.08 (*)    All other components  within normal limits  PROTIME-INR - Abnormal; Notable for the following components:   Prothrombin Time 25.8 (*)    INR 2.4 (*)    All other components within normal limits  URINALYSIS, ROUTINE W REFLEX MICROSCOPIC - Abnormal; Notable for the following components:   APPearance HAZY (*)    Hgb urine  dipstick SMALL (*)    Ketones, ur 5 (*)    All other components within normal limits  I-STAT CHEM 8, ED - Abnormal; Notable for the following components:   Creatinine, Ser 1.40 (*)    Glucose, Bld 113 (*)    Calcium, Ion 1.05 (*)    TCO2 19 (*)    Hemoglobin 11.2 (*)    HCT 33.0 (*)    All other components within normal limits  I-STAT VENOUS BLOOD GAS, ED - Abnormal; Notable for the following components:   pH, Ven 7.463 (*)    pCO2, Ven 24.9 (*)    pO2, Ven 31 (*)    Bicarbonate 17.8 (*)    TCO2 19 (*)    Acid-base deficit 5.0 (*)    Calcium, Ion 1.06 (*)    HCT 33.0 (*)    Hemoglobin 11.2 (*)    All other components within normal limits  TROPONIN I (HIGH SENSITIVITY) - Abnormal; Notable for the following components:   Troponin I (High Sensitivity) 23 (*)    All other components within normal limits  RESP PANEL BY RT-PCR (RSV, FLU A&B, COVID)  RVPGX2  CULTURE, BLOOD (ROUTINE X 2)  CULTURE, BLOOD (ROUTINE X 2)  URINE CULTURE  MAGNESIUM  LACTIC ACID, PLASMA  TROPONIN I (HIGH SENSITIVITY)    EKG EKG Interpretation  Date/Time:  Saturday March 12 2022 11:56:32 EST Ventricular Rate:  84 PR Interval:    QRS Duration: 105 QT Interval:  382 QTC Calculation: 452 R Axis:   -10 Text Interpretation: Atrial fibrillation Abnormal R-wave progression, early transition Borderline repolarization abnormality Confirmed by Godfrey Pick ([id number]) on 03/12/2022 12:20:13 PM  Radiology CT CHEST ABDOMEN PELVIS WO CONTRAST  Result Date: 03/12/2022 CLINICAL DATA:  Generalized weakness and dysuria, suspicion for sepsis EXAM: CT CHEST, ABDOMEN AND PELVIS WITHOUT CONTRAST TECHNIQUE: Multidetector CT  imaging of the chest, abdomen and pelvis was performed following the standard protocol without IV contrast. RADIATION DOSE REDUCTION: This exam was performed according to the departmental dose-optimization program which includes automated exposure control, adjustment of the mA and/or kV according to patient size and/or use of iterative reconstruction technique. COMPARISON:  CT chest dated 02/03/2020, CT abdomen and pelvis dated 07/13/2014 FINDINGS: CT CHEST FINDINGS Technically challenging examination for evaluation of detailed findings due to patient respiratory motion. Cardiovascular: Multichamber cardiomegaly. No significant pericardial fluid/thickening. Coronary artery calcifications. Great vessels are normal in course and caliber. Mediastinum/Nodes: Thyroid gland without nodules meeting criteria for imaging follow-up by size. Normal esophagus. No pathologically enlarged axillary, supraclavicular, mediastinal, or hilar lymph nodes. Lungs/Pleura: The central airways are patent. No focal consolidation. Similar lower lung predominant subpleural reticulations. No pneumothorax. Similar trace pleural effusion along the medial right lower lobe and adjacent opacity, likely reflecting to prior treatment related changes. Musculoskeletal: No acute or abnormal lytic or blastic osseous lesions. CT ABDOMEN PELVIS FINDINGS Hepatobiliary: No focal hepatic lesions. No intra or extrahepatic biliary ductal dilation. Cholecystectomy. Pancreas: No focal lesions or main ductal dilation. Spleen: Normal in size without focal abnormality. Adrenals/Urinary Tract: No adrenal nodules. Duplex right kidney. No suspicious renal mass, calculi, or hydronephrosis. Distended urinary bladder. Stomach/Bowel: Normal appearance of the stomach. No evidence of bowel wall thickening, distention, or inflammatory changes. The appendix is not discretely seen. colonic diverticulosis without acute diverticulitis. Vascular/Lymphatic: Aortic atherosclerosis.  No enlarged abdominal or pelvic lymph nodes. Reproductive: Prostate is unremarkable. Other: No free fluid, fluid collection, or free air. Musculoskeletal: Median sternotomy wires are nondisplaced. No acute or abnormal lytic or blastic  osseous findings. Age indeterminate L1 compression deformity is new since 07/13/2014. Unchanged grade 1 anterolisthesis at L4-5. IMPRESSION: 1. Technically challenging examination for evaluation of detailed findings due to patient respiratory motion. No acute abnormality in the chest, abdomen, or pelvis. 2. Distended urinary bladder. Consider correlation with bladder outlet obstruction. 3. Age indeterminate L1 compression deformity is new since 07/13/2014. Correlate with point tenderness. 4. Aortic Atherosclerosis (ICD10-I70.0). Electronically Signed   By: Darrin Nipper M.D.   On: 03/12/2022 12:59   CT Head Wo Contrast  Result Date: 03/12/2022 CLINICAL DATA:  Altered mental status EXAM: CT HEAD WITHOUT CONTRAST TECHNIQUE: Contiguous axial images were obtained from the base of the skull through the vertex without intravenous contrast. RADIATION DOSE REDUCTION: This exam was performed according to the departmental dose-optimization program which includes automated exposure control, adjustment of the mA and/or kV according to patient size and/or use of iterative reconstruction technique. COMPARISON:  CT Head 03/09/19 FINDINGS: Brain: No evidence of acute infarction, hemorrhage, hydrocephalus, extra-axial collection or mass lesion/mass effect. Redemonstrated chronic small-vessel infarcts in the right corona radiata, left thalamus, and right cerebellum. Sequela of mild chronic microvascular ischemic change. Unchanged prominence of the subarachnoid space on the bilateral frontal lobes. Vascular: No hyperdense vessel or unexpected calcification. Skull: Normal. Negative for fracture or focal lesion. Sinuses/Orbits: No acute finding. Bilateral lens replacement. Air-fluid level in the left  maxillary sinus with osseous findings suggestive of chronic underlying sinusitis. Other: None. IMPRESSION: 1. No acute intracranial abnormality. 2. Air-fluid level in the left maxillary sinus with osseous findings suggestive of chronic underlying sinusitis. Electronically Signed   By: Marin Roberts M.D.   On: 03/12/2022 12:49   DG Chest Port 1 View  Result Date: 03/12/2022 CLINICAL DATA:  Possible sepsis EXAM: PORTABLE CHEST 1 VIEW COMPARISON:  CXR 02/15/22 FINDINGS: Status post median sternotomy. No pleural effusion. No pneumothorax. Unchanged cardiac and mediastinal contours. Compared to prior exam there is a new hazy opacity at the right costophrenic angle. Degenerative changes of the right AC joint. Displaced rib fractures. Visualized upper abdomen is radiographically unremarkable. IMPRESSION: Hazy opacity at the right costophrenic angle could represent atelectasis or infection. Electronically Signed   By: Marin Roberts M.D.   On: 03/12/2022 12:09    Procedures Procedures    Medications Ordered in ED Medications  lactated ringers infusion (has no administration in time range)  vancomycin (VANCOREADY) IVPB 1500 mg/300 mL (has no administration in time range)  metoprolol tartrate (LOPRESSOR) tablet 12.5 mg (has no administration in time range)  rosuvastatin (CRESTOR) tablet 5 mg (has no administration in time range)  FLUoxetine (PROZAC) capsule 20 mg (has no administration in time range)  pantoprazole (PROTONIX) EC tablet 40 mg (has no administration in time range)  tamsulosin (FLOMAX) capsule 0.4 mg (has no administration in time range)  Rivaroxaban (XARELTO) tablet 15 mg (has no administration in time range)  sodium chloride flush (NS) 0.9 % injection 3 mL (has no administration in time range)  acetaminophen (TYLENOL) tablet 650 mg (has no administration in time range)    Or  acetaminophen (TYLENOL) suppository 650 mg (has no administration in time range)  polyethylene glycol (MIRALAX /  GLYCOLAX) packet 17 g (has no administration in time range)  lactated ringers bolus 1,000 mL (0 mLs Intravenous Stopped 03/12/22 1407)  acetaminophen (TYLENOL) tablet 650 mg (650 mg Oral Given 03/12/22 1200)  ceFEPIme (MAXIPIME) 2 g in sodium chloride 0.9 % 100 mL IVPB (0 g Intravenous Stopped 03/12/22 1254)  metroNIDAZOLE (FLAGYL) IVPB 500  mg (0 mg Intravenous Stopped 03/12/22 1407)  lactated ringers bolus 1,000 mL (0 mLs Intravenous Stopped 03/12/22 1258)    ED Course/ Medical Decision Making/ A&P                           Medical Decision Making Amount and/or Complexity of Data Reviewed Labs: ordered. Radiology: ordered. ECG/medicine tests: ordered.  Risk OTC drugs. Prescription drug management.   This patient presents to the ED for concern of generalized weakness, this involves an extensive number of treatment options, and is a complaint that carries with it a high risk of complications and morbidity.  The differential diagnosis includes infection, dehydration, sepsis, polypharmacy, deconditioning, metabolic derangement, anemia   Co morbidities that complicate the patient evaluation  PVD, GERD, HLD, HTN, arthritis, asthma, CAD, anxiety, BPH, prior PE, prior ICH, CKD, aortic stenosis.   Additional history obtained:  Additional history obtained from EMS, patient's family External records from outside source obtained and reviewed including EMR   Lab Tests:  I Ordered, and personally interpreted labs.  The pertinent results include: Leukocytosis and lactic acidosis are present on initial lab work, consistent with sepsis.  Anemia is baseline.  Creatinine is baseline.  Urinalysis is without evidence of infection.   Imaging Studies ordered:  I ordered imaging studies including chest x-ray, CT of head, chest, abdomen, pelvis I independently visualized and interpreted imaging which showed distended urinary bladder, indeterminate reticular opacities of lung base I agree with the  radiologist interpretation   Cardiac Monitoring: / EKG:  The patient was maintained on a cardiac monitor.  I personally viewed and interpreted the cardiac monitored which showed an underlying rhythm of: Atrial fibrillation  Problem List / ED Course / Critical interventions / Medication management  Patient presents from home via EMS for generalized weakness and confusion.  Family noticed shaking and chills starting last night.  This morning they noticed generalized weakness and confusion.  On arrival in the ED, patient is oriented to self only.  On exam, he is tachypneic.  He denies any areas of discomfort.  He has no areas of tenderness.  Oral mucosa is dry.  He has a history of stroke with reported ongoing right hemibody deficits.  He does appear to have slight facial asymmetry and mild contracture of right arm.  No new focal deficits are appreciated.  Patient is unable to provide history.  History is provided by his daughter over telephone.  She is currently on her way to the hospital.  Septic workup was initiated.  IV fluids were given.  Rectal temperature shows a temp of 101.5 degrees.  Tylenol was ordered for antipyresis.  Patient was given IV fluids and broad-spectrum antibiotics for treatment of sepsis.  Workup was notable for leukocytosis and lactic acidosis.  On imaging studies, there was some indeterminate reticular opacity of left lung base.  There was also an incidental finding of distended urinary bladder.  Foley catheter was ordered.  On initial urine studies, there was no evidence of infection.  Following IV fluids, family reports that mentation seems to have improved.  While in the ED, patient remained hemodynamically stable.  Blood pressures were soft, which patient's family states is baseline for him.  He maintained normal SpO2 on room air.  He did complain of intermittent shortness of breath.  Patient was admitted to hospitalist for further management. I ordered medication including IV  fluids and broad-spectrum antibiotics for treatment of sepsis; Tylenol for antipyresis Reevaluation  of the patient after these medicines showed that the patient improved I have reviewed the patients home medicines and have made adjustments as needed   Social Determinants of Health:  Lives at home with family  CRITICAL CARE Performed by: Godfrey Pick   Total critical care time: 34 minutes  Critical care time was exclusive of separately billable procedures and treating other patients.  Critical care was necessary to treat or prevent imminent or life-threatening deterioration.  Critical care was time spent personally by me on the following activities: development of treatment plan with patient and/or surrogate as well as nursing, discussions with consultants, evaluation of patient's response to treatment, examination of patient, obtaining history from patient or surrogate, ordering and performing treatments and interventions, ordering and review of laboratory studies, ordering and review of radiographic studies, pulse oximetry and re-evaluation of patient's condition.         Final Clinical Impression(s) / ED Diagnoses Final diagnoses:  SIRS (systemic inflammatory response syndrome) ([place])  Generalized weakness    Rx / DC Orders ED Discharge Orders     None         Godfrey Pick, MD 03/12/22 1410

## 2022-03-12 NOTE — ED Triage Notes (Addendum)
Pt BIB [facility] from home c/o not acting like himself but not confused they state he is weaker than normal. Per EMS pt's urine has a foul odor, and also endorses some pain with urination. Pt has a hx of a stroke with right arm contracted. Pt states he does not think he has pain with urination.

## 2022-03-12 NOTE — H&P (Addendum)
History and Physical   Evan [name] DOB: 14-May-1927 DOA: 03/12/2022  PCP: [name], MD   Patient coming from: Home  Chief Complaint: Weakness, shaking  HPI: Evan [name] is a 88 y.o. male with medical history significant of PVD, CKD 3A, radiation pneumonitis, stroke (thalamic hemorrhage) with residual hemiparesis, CAD status post CABG, PE, atrial fibrillation, hyperlipidemia, anxiety, depression, hypertension, lung cancer, CHF, pericarditis, GERD, COPD, BPH, tremor presenting with weakness and shaking.  Family reports noting some shaking at dinner last night.  And seem to have some gradual worsening of this shaking during the night.  Also reports some chills overnight. This morning he had continued shaking and was severely weak and unable to stand even with assistance.  He had reported some dysuria to EMS per chart review but not here.  He denies fevers, chest pain, shortness of breath, abdominal pain, constipation, diarrhea, nausea, vomiting.  ED Course: Vital signs in the ED significant for fever to one 1.5.  Heart rate in the 70s to 100s.  Blood pressure in the 100s to 120s for the most part with a few low blood pressures in the 56D to 14H systolic but these did not persist.  Lab workup included CMP with bicarb 18, creatinine stable 1.53, glucose 115, T. bili 1.6.  Magnesium normal.  CBC with leukocytosis to 11.2, hemoglobin stable at 10.5, platelets stable at 118.  PT and INR elevated at 25.8 and 2.4 respectively.  Lactic acid mildly elevated at 2.3 with repeat pending.  Troponin mildly elevated at 23 with repeat pending.  Respiratory panel for flu and COVID pending.  Urinalysis showed small hemoglobin and ketones only.  Urine culture and blood culture pending.  VBG showed pH 7.46 and pCO2 of 24.9.  Chest x-ray showed hazy opacity at the right costophrenic angle consistent with atelectasis versus infection.  CT of the head showed no acute normalities but did show chronic  sinus changes.  CT of the chest abdomen pelvis was somewhat degraded due to respiratory motion, but did show distended bladder and age indeterminant L1 compression fracture.  Patient started on vancomycin, cefepime, Flagyl in the ED.  Also received 2 L of IV fluids and started on a rate of fluids.  Additionally received a dose of Tylenol for fever.  Review of Systems: As per HPI otherwise all other systems reviewed and are negative.  Past Medical History:  Diagnosis Date   Adenocarcinoma of right lung ([place]) 01/06/2016   Anginal chest pain at rest    Chronic non, controlled on Lorazepam   Anxiety    Arthritis    "all over"    BPH (benign prostatic hyperplasia)    CAD (coronary artery disease)    Chronic lower back pain    Complication of anesthesia    "problems making water afterwards"   Depression    DJD (degenerative joint disease)    Esophageal ulcer without bleeding    bid ppi indefinitely   Family history of adverse reaction to anesthesia    "children w/PONV"   GERD (gastroesophageal reflux disease)    Headache    "couple/week maybe" (09/04/2015)   Hemochromatosis    Possible, elevated iron stores, hook-like osteophytes on hand films, normal LFTs   Hepatitis    "yellow jaundice as a baby"   History of ASCVD    MULTIVESSEL   Hyperlipidemia    Hypertension    Mild aortic stenosis 06/23/2021   Osteoarthritis    Pneumonia    "several times; got  a little now" (09/04/2015)   Rheumatoid arthritis ([place])    Subdural hematoma ([place])    small after fall 09/2016-plavix held, neurosurgery consulted.  Asymptomatic.     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[place]) 02/2018   on Lovenox lifelong since he failed PO Eliquis    Past Surgical History:  Procedure Laterality Date   ARM SKIN LESION BIOPSY / EXCISION Left 09/02/2015   CATARACT EXTRACTION W/ INTRAOCULAR LENS  IMPLANT, BILATERAL Bilateral    CHOLECYSTECTOMY OPEN     CORNEAL TRANSPLANT Bilateral    "one at [facility]; one at [facility]"   Benavides W/ STENT     DE stent ostium into the right radial free graft at [facility]-, 02-2007   ESOPHAGOGASTRODUODENOSCOPY N/A 11/09/2014   Procedure: ESOPHAGOGASTRODUODENOSCOPY (EGD);  Surgeon: [name], MD;  Location[facility] ENDOSCOPY;  Service: Endoscopy;  Laterality: N/A;   INGUINAL HERNIA REPAIR     JOINT REPLACEMENT     LEFT HEART CATH AND CORS/GRAFTS ANGIOGRAPHY N/A 06/27/2017   Procedure: LEFT HEART CATH AND CORS/GRAFTS ANGIOGRAPHY;  Surgeon: [name], MD;  Location: [place] CV LAB;  Service: Cardiovascular;  Laterality: N/A;   NASAL SINUS SURGERY     SHOULDER OPEN ROTATOR CUFF REPAIR Right    TOTAL KNEE ARTHROPLASTY Bilateral     Social History  reports that he has never smoked. He has been exposed to tobacco smoke. He has quit using smokeless tobacco.  His smokeless tobacco use included chew. He reports that he does not drink alcohol and does not use drugs.  Allergies  Allergen Reactions   Feldene [Piroxicam] Rash    Blistering rash   Neomycin-Polymyxin-Hc Itching   Statins Other (See Comments)    Myalgias   Valium [Diazepam]    Latex Rash   Tequin [Gatifloxacin] Anxiety   Vibra-Tab [Doxycycline] Other (See Comments)    Family History  Problem Relation Age of Onset   Arthritis-Osteo Sister    Heart attack Brother    Heart attack Other    Cancer Neg Hx   Reviewed on admission  Prior to Admission medications   Medication Sig Start Date End Date Taking? Authorizing Provider  acetaminophen (TYLENOL) 325 MG tablet Take 1-2 tablets (325-650 mg total) by mouth every 4 (four) hours as needed for mild pain. 08/09/18   Love, Ivan Anchors, PA-C  amoxicillin-clavulanate (AUGMENTIN) 875-125 MG tablet Take 1 tablet by mouth 2 (two) times daily. 02/10/22   [name], MD  FLUoxetine (PROZAC) 20 MG capsule TAKE 1 CAPSULE EVERY DAY 01/05/22   [name], MD  furosemide (LASIX) 20 MG tablet Take  1 tablet (20 mg total) by mouth daily. 11/01/21   Jettie Booze, MD  Lifitegrast Shirley Friar) 5 % SOLN Place 1 drop into both eyes 2 (two) times daily. 12/01/21     metoprolol tartrate (LOPRESSOR) 25 MG tablet TAKE 1/2 TABLET TWICE DAILY Patient taking differently: Take 12.5 mg by mouth daily. 08/23/21   [name], MD  nitroGLYCERIN (NITROSTAT) 0.4 MG SL tablet Place 1 tablet (0.4 mg total) under the tongue every 5 (five) minutes as needed for chest pain. 02/01/21   [name], MD  pantoprazole (PROTONIX) 40 MG tablet TAKE 1 TABLET EVERY DAY 04/21/21   [name], MD  polyethylene glycol (MIRALAX / GLYCOLAX) 17 g packet Take 17 g by mouth daily as needed for mild constipation. 08/14/18   [place],  Lavon Paganini, PA-C  rosuvastatin (CRESTOR) 5 MG tablet TAKE 1 TABLET EVERY DAY 08/16/21   [name], PA-C  tacrolimus (PROTOPIC) 0.1 % ointment Apply 1 application topically 2 (two) times daily. 11/10/20   [name], MD  tamsulosin (FLOMAX) 0.4 MG CAPS capsule TAKE 1 CAPSULE EVERY DAY 01/10/22   [name], MD  XARELTO 15 MG TABS tablet TAKE 1 TABLET EVERY DAY WITH SUPPER 02/07/22   Jettie Booze, MD    Physical Exam: Vitals:   03/12/22 1255 03/12/22 1345 03/12/22 1359 03/12/22 1415  BP: 106/62 (!) 89/65 108/62 (!) 97/54  Pulse: 78 73 76 69  Resp: 13 16 20 19   Temp: 99.2 F (37.3 C)     TempSrc: Oral     SpO2: 96% 100% 99% 96%    Physical Exam Constitutional:      General: He is not in acute distress.    Appearance: Normal appearance.  HENT:     Head: Normocephalic and atraumatic.     Mouth/Throat:     Mouth: Mucous membranes are moist.     Pharynx: Oropharynx is clear.  Eyes:     Extraocular Movements: Extraocular movements intact.     Pupils: Pupils are equal, round, and reactive to light.  Cardiovascular:     Rate and Rhythm: Normal rate. Rhythm irregular.     Pulses: Normal pulses.     Heart sounds: Normal heart sounds.  Pulmonary:      Effort: Pulmonary effort is normal. No respiratory distress.     Breath sounds: Normal breath sounds.  Abdominal:     General: Bowel sounds are normal. There is no distension.     Palpations: Abdomen is soft.     Tenderness: There is no abdominal tenderness.  Musculoskeletal:        General: No swelling or deformity.  Skin:    General: Skin is warm and dry.  Neurological:     General: No focal deficit present.     Mental Status: Mental status is at baseline.    Labs on Admission: I have personally reviewed following labs and imaging studies  CBC: Recent Labs  Lab 03/12/22 1148 03/12/22 1203  WBC 11.2*  --   NEUTROABS 9.7*  --   HGB 10.5* 11.2*  11.2*  HCT 32.8* 33.0*  33.0*  MCV 99.7  --   PLT 118*  --     Basic Metabolic Panel: Recent Labs  Lab 03/12/22 1148 03/12/22 1203  NA 135 135  135  K 4.3 4.2  4.2  CL 104 103  CO2 18*  --   GLUCOSE 115* 113*  BUN 18 18  CREATININE 1.53* 1.40*  CALCIUM 9.0  --   MG 1.9  --     GFR: CrCl cannot be calculated (Unknown ideal weight.).  Liver Function Tests: Recent Labs  Lab 03/12/22 1148  AST 23  ALT 8  ALKPHOS 94  BILITOT 1.6*  PROT 7.2  ALBUMIN 3.6    Urine analysis:    Component Value Date/Time   COLORURINE YELLOW 03/12/2022 1259   APPEARANCEUR HAZY (A) 03/12/2022 1259   LABSPEC 1.013 03/12/2022 1259   PHURINE 7.0 03/12/2022 1259   GLUCOSEU NEGATIVE 03/12/2022 1259   HGBUR SMALL (A) 03/12/2022 1259   BILIRUBINUR NEGATIVE 03/12/2022 1259   KETONESUR 5 (A) 03/12/2022 1259   PROTEINUR NEGATIVE 03/12/2022 1259   UROBILINOGEN 0.2 07/13/2014 1804   NITRITE NEGATIVE 03/12/2022 1259   LEUKOCYTESUR NEGATIVE 03/12/2022 1259  Radiological Exams on Admission: CT CHEST ABDOMEN PELVIS WO CONTRAST  Result Date: 03/12/2022 CLINICAL DATA:  Generalized weakness and dysuria, suspicion for sepsis EXAM: CT CHEST, ABDOMEN AND PELVIS WITHOUT CONTRAST TECHNIQUE: Multidetector CT imaging of the chest, abdomen and  pelvis was performed following the standard protocol without IV contrast. RADIATION DOSE REDUCTION: This exam was performed according to the departmental dose-optimization program which includes automated exposure control, adjustment of the mA and/or kV according to patient size and/or use of iterative reconstruction technique. COMPARISON:  CT chest dated 02/03/2020, CT abdomen and pelvis dated 07/13/2014 FINDINGS: CT CHEST FINDINGS Technically challenging examination for evaluation of detailed findings due to patient respiratory motion. Cardiovascular: Multichamber cardiomegaly. No significant pericardial fluid/thickening. Coronary artery calcifications. Great vessels are normal in course and caliber. Mediastinum/Nodes: Thyroid gland without nodules meeting criteria for imaging follow-up by size. Normal esophagus. No pathologically enlarged axillary, supraclavicular, mediastinal, or hilar lymph nodes. Lungs/Pleura: The central airways are patent. No focal consolidation. Similar lower lung predominant subpleural reticulations. No pneumothorax. Similar trace pleural effusion along the medial right lower lobe and adjacent opacity, likely reflecting to prior treatment related changes. Musculoskeletal: No acute or abnormal lytic or blastic osseous lesions. CT ABDOMEN PELVIS FINDINGS Hepatobiliary: No focal hepatic lesions. No intra or extrahepatic biliary ductal dilation. Cholecystectomy. Pancreas: No focal lesions or main ductal dilation. Spleen: Normal in size without focal abnormality. Adrenals/Urinary Tract: No adrenal nodules. Duplex right kidney. No suspicious renal mass, calculi, or hydronephrosis. Distended urinary bladder. Stomach/Bowel: Normal appearance of the stomach. No evidence of bowel wall thickening, distention, or inflammatory changes. The appendix is not discretely seen. colonic diverticulosis without acute diverticulitis. Vascular/Lymphatic: Aortic atherosclerosis. No enlarged abdominal or pelvic  lymph nodes. Reproductive: Prostate is unremarkable. Other: No free fluid, fluid collection, or free air. Musculoskeletal: Median sternotomy wires are nondisplaced. No acute or abnormal lytic or blastic osseous findings. Age indeterminate L1 compression deformity is new since 07/13/2014. Unchanged grade 1 anterolisthesis at L4-5. IMPRESSION: 1. Technically challenging examination for evaluation of detailed findings due to patient respiratory motion. No acute abnormality in the chest, abdomen, or pelvis. 2. Distended urinary bladder. Consider correlation with bladder outlet obstruction. 3. Age indeterminate L1 compression deformity is new since 07/13/2014. Correlate with point tenderness. 4. Aortic Atherosclerosis (ICD10-I70.0). Electronically Signed   By: Darrin Nipper M.D.   On: 03/12/2022 12:59   CT Head Wo Contrast  Result Date: 03/12/2022 CLINICAL DATA:  Altered mental status EXAM: CT HEAD WITHOUT CONTRAST TECHNIQUE: Contiguous axial images were obtained from the base of the skull through the vertex without intravenous contrast. RADIATION DOSE REDUCTION: This exam was performed according to the departmental dose-optimization program which includes automated exposure control, adjustment of the mA and/or kV according to patient size and/or use of iterative reconstruction technique. COMPARISON:  CT Head 03/09/19 FINDINGS: Brain: No evidence of acute infarction, hemorrhage, hydrocephalus, extra-axial collection or mass lesion/mass effect. Redemonstrated chronic small-vessel infarcts in the right corona radiata, left thalamus, and right cerebellum. Sequela of mild chronic microvascular ischemic change. Unchanged prominence of the subarachnoid space on the bilateral frontal lobes. Vascular: No hyperdense vessel or unexpected calcification. Skull: Normal. Negative for fracture or focal lesion. Sinuses/Orbits: No acute finding. Bilateral lens replacement. Air-fluid level in the left maxillary sinus with osseous findings  suggestive of chronic underlying sinusitis. Other: None. IMPRESSION: 1. No acute intracranial abnormality. 2. Air-fluid level in the left maxillary sinus with osseous findings suggestive of chronic underlying sinusitis. Electronically Signed   By: Marin Roberts M.D.   On:  03/12/2022 12:49   DG Chest Port 1 View  Result Date: 03/12/2022 CLINICAL DATA:  Possible sepsis EXAM: PORTABLE CHEST 1 VIEW COMPARISON:  CXR 02/15/22 FINDINGS: Status post median sternotomy. No pleural effusion. No pneumothorax. Unchanged cardiac and mediastinal contours. Compared to prior exam there is a new hazy opacity at the right costophrenic angle. Degenerative changes of the right AC joint. Displaced rib fractures. Visualized upper abdomen is radiographically unremarkable. IMPRESSION: Hazy opacity at the right costophrenic angle could represent atelectasis or infection. Electronically Signed   By: Marin Roberts M.D.   On: 03/12/2022 12:09    EKG: Independently reviewed.  Atrial fibrillation at 84 bpm.  Nonspecific T wave flattening.  Some baseline artifact and baseline wander.  Assessment/Plan Principal Problem:   Pneumonia Active Problems:   UNSPECIFIED PERIPHERAL VASCULAR DISEASE   GERD (gastroesophageal reflux disease)   Hyperlipidemia LDL goal <70   Essential hypertension   S/P CABG (coronary artery bypass graft)   BPH (benign prostatic hyperplasia)   Coronary artery disease involving native coronary artery of native heart without angina pectoris   GAD (generalized anxiety disorder)   Permanent atrial fibrillation (HCC)   History of pericarditis   HFmrEF (heart failure with mildly reduced ejection fraction) (HCC)   Pulmonary embolism (HCC)   History of lung cancer   Depression   Stage 3a chronic kidney disease (HCC)   COPD (chronic obstructive pulmonary disease) (HCC)   Pneumonia Weakness Shaking ?Hypotension > Patient presenting as he was off his baseline and had been shaking and had significant  increased weakness and some chills overnight. > Workup in the ED showed evidence of possible pneumonia with chest x-ray showing hazy opacity at the right costophrenic angle and leukocytosis to 11.2 as well as fever of 101.5.  No other obvious source of infection on CT of the chest abdomen pelvis and urinalysis showing small hemoglobin and ketones only. > Had some intermittent hypotension however this was not persistent and may have been an measurement error.  Blood pressure largely reads in the 91M to 384Y systolic at this time and his baseline is in the 659D systolic.  Has received 2 L IV fluids in the ED will hold off on giving too much more fluid given history of CHF.  Initial lactic acid 2.3 with repeat pending. > After the course of workup patient initially received Vanco cefepime and Flagyl for broad spectrum coverage.  Given at this time primary etiology is thought to be most likely pneumonia will narrow to ceftriaxone and azithromycin. - Monitor on progressive unit given intermittent hypotension - Narrow antibiotics to ceftriaxone and azithromycin - Follow-up urine culture and blood culture - Trend fever curve and WBC - PT and OT eval and treat starting tomorrow  CHF > Last echo was earlier this year with EF of 45-50%, indeterminate diastolic function and mildly reduced RV function. - Holding home Lasix in the setting of low low normal blood pressure with intermittent hypotension in the ED. - Monitor closely for need to resume diuresis, strict I's and O's, daily weights - Continue home metoprolol  CAD Hyperlipidemia > History of CABG - Continue home metoprolol - Continue home Xarelto - Continue home rosuvastatin  Atrial fibrillation > Remains in A-fib in the ED - Continue metoprolol and Xarelto  History of pulmonary embolus - Currently on Xarelto  History of CVA > History of stroke with right-sided hemiparesis/deficits - Continue home rosuvastatin, Xarelto  CKD 3a >  Creatinine stable at 1.5 in the ED. - Continue to  trend renal function and electrolytes  GERD - Continue PPI  Anxiety Depression - Continue home fluoxetine  BPH > Some urinary retention noted on CT scan, catheterized in the ED with relief.  Family reports had some issues with urgency for the last week. - Continue home tamsulosin  History of lung cancer - Noted  DVT prophylaxis: Xarelto Code Status:   Full  Family Communication:  Updated at bedside Disposition Plan:   Patient is from:  Home  Anticipated DC to:  Home  Anticipated DC date:  2 to 5 days  Anticipated DC barriers: None  Consults called:  None Admission status:  Inpatient, progressive  Severity of Illness: The appropriate patient status for this patient is INPATIENT. Inpatient status is judged to be reasonable and necessary in order to provide the required intensity of service to ensure the patient's safety. The patient's presenting symptoms, physical exam findings, and initial radiographic and laboratory data in the context of their chronic comorbidities is felt to place them at high risk for further clinical deterioration. Furthermore, it is not anticipated that the patient will be medically stable for discharge from the hospital within 2 midnights of admission.   * I certify that at the point of admission it is my clinical judgment that the patient will require inpatient hospital care spanning beyond 2 midnights from the point of admission due to high intensity of service, high risk for further deterioration and high frequency of surveillance required.Marcelyn Bruins MD [facility]  How to contact the [facility] Attending or Consulting provider [place] or covering provider during after hours [place], for this patient?   Check the care team in [facility] and look for a) attending/consulting [facility] provider listed and b) the [facility] team listed Log into [email or web address] and use [place]'s universal password to access. If  you do not have the password, please contact the hospital operator. Locate the [facility] provider you are looking for under [facility] and page to a number that you can be directly reached. If you still have difficulty reaching the provider, please page the [facility] (Director on Call) for the Hospitalists listed on amion for assistance.  03/12/2022, 2:20 PM

## 2022-03-12 NOTE — ED Notes (Signed)
Rn accepted report pts bed is ready

## 2022-03-13 DIAGNOSIS — N1831 Chronic kidney disease, stage 3a: Secondary | ICD-10-CM | POA: Diagnosis not present

## 2022-03-13 DIAGNOSIS — R6510 Systemic inflammatory response syndrome (SIRS) of non-infectious origin without acute organ dysfunction: Secondary | ICD-10-CM

## 2022-03-13 DIAGNOSIS — N401 Enlarged prostate with lower urinary tract symptoms: Secondary | ICD-10-CM | POA: Diagnosis not present

## 2022-03-13 DIAGNOSIS — I10 Essential (primary) hypertension: Secondary | ICD-10-CM

## 2022-03-13 LAB — COMPREHENSIVE METABOLIC PANEL
ALT: 8 U/L (ref 0–44)
AST: 15 U/L (ref 15–41)
Albumin: 2.5 g/dL — ABNORMAL LOW (ref 3.5–5.0)
Alkaline Phosphatase: 63 U/L (ref 38–126)
Anion gap: 8 (ref 5–15)
BUN: 18 mg/dL (ref 8–23)
CO2: 20 mmol/L — ABNORMAL LOW (ref 22–32)
Calcium: 8.2 mg/dL — ABNORMAL LOW (ref 8.9–10.3)
Chloride: 107 mmol/L (ref 98–111)
Creatinine, Ser: 1.3 mg/dL — ABNORMAL HIGH (ref 0.61–1.24)
GFR, Estimated: 52 mL/min — ABNORMAL LOW (ref >60)
Glucose, Bld: 95 mg/dL (ref 70–99)
Potassium: 3.8 mmol/L (ref 3.5–5.1)
Sodium: 135 mmol/L (ref 135–145)
Total Bilirubin: 0.9 mg/dL (ref 0.3–1.2)
Total Protein: 5.3 g/dL — ABNORMAL LOW (ref 6.5–8.1)

## 2022-03-13 LAB — PROTIME-INR
INR: 2.2 — ABNORMAL HIGH (ref 0.8–1.2)
Prothrombin Time: 24 seconds — ABNORMAL HIGH (ref 11.4–15.2)

## 2022-03-13 LAB — CBC
HCT: 25.9 % — ABNORMAL LOW (ref 39.0–52.0)
Hemoglobin: 8.6 g/dL — ABNORMAL LOW (ref 13.0–17.0)
MCH: 32.7 pg (ref 26.0–34.0)
MCHC: 33.2 g/dL (ref 30.0–36.0)
MCV: 98.5 fL (ref 80.0–100.0)
Platelets: 85 10*3/uL — ABNORMAL LOW (ref 150–400)
RBC: 2.63 MIL/uL — ABNORMAL LOW (ref 4.22–5.81)
RDW: 14.6 % (ref 11.5–15.5)
WBC: 6.7 10*3/uL (ref 4.0–10.5)
nRBC: 0 % (ref 0.0–0.2)

## 2022-03-13 LAB — URINE CULTURE

## 2022-03-13 MED ORDER — FINASTERIDE 5 MG PO TABS
5.0000 mg | ORAL_TABLET | Freq: Every day | ORAL | Status: DC
  Administered 2022-03-13 – 2022-03-15 (×3): 5 mg via ORAL
  Filled 2022-03-13 (×3): qty 1

## 2022-03-13 MED ORDER — FUROSEMIDE 20 MG PO TABS
20.0000 mg | ORAL_TABLET | Freq: Every morning | ORAL | Status: DC
  Administered 2022-03-13 – 2022-03-14 (×2): 20 mg via ORAL
  Filled 2022-03-13 (×3): qty 1

## 2022-03-13 MED ORDER — ORAL CARE MOUTH RINSE: 15.0000 mL | OROMUCOSAL | Status: DC | PRN

## 2022-03-13 NOTE — Progress Notes (Addendum)
**Note Evan-Identified via Obfuscation** PROGRESS NOTE        PATIENT DETAILS Name: Evan [name] Age: 88 y.o. Sex: male Date of Birth: April 14, 1927 Admit Date: 03/12/2022 Admitting Physician Marcelyn Bruins, MD [id number], Evan Mcgee, MD  Brief Summary: Patient is a 88 y.o.  male with history of CKD stage IIIa, prior CVA, CAD s/p CABG, PE on anticoagulation, A-fib, HLD, BPH, essential tremor-who presented with subjective fever, chills-cough-thought to have PNA and subsequently admitted to the hospitalist service.  Significant events: 12/09>> admit to [facility] likely due to PNA.  Significant studies: 01/10>> echo: EF 45-50% 12/09>> CXR: Hazy opacity right costophrenic angle. 12/09>> CT head: No acute intracranial malady 12/09>> CT chest/abdomen/pelvis: Poor study-no acute abnormality in chest/abdomen/pelvis.  Distended urinary bladder.  Age-indeterminate L1 compression fracture.  Significant microbiology data: 12/09>> COVID/influenza/RSV: Negative 12/09>> urine culture: Multiple species 12/09>> blood culture: No growth  Procedures: None  Consults: None  Subjective: Afebrile overnight.  Some cough.  Sleeping-son at bedside.  Objective: Vitals: Blood pressure 133/76, pulse 61, temperature 97.9 F (36.6 C), temperature source Axillary, resp. rate 18, weight 73.5 kg, SpO2 98 %.   Exam: Gen Exam:Alert awake-not in any distress HEENT:atraumatic, normocephalic Chest: B/L clear to auscultation anteriorly CVS:S1S2 regular Abdomen:soft non tender, non distended Extremities:no edema Neurology: Seems to be moving all 4 extremities. Skin: no rash  Pertinent Labs/Radiology:    Latest Ref Rng & Units 03/13/2022   12:31 AM 03/12/2022   12:03 PM 03/12/2022   11:48 AM  CBC  WBC 4.0 - 10.5 K/uL 6.7   11.2   Hemoglobin 13.0 - 17.0 g/dL 8.6  11.2    11.2  10.5   Hematocrit 39.0 - 52.0 % 25.9  33.0    33.0  32.8   Platelets 150 - 400 K/uL 85   118     Lab Results  Component Value Date   NA  135 03/13/2022   K 3.8 03/13/2022   CL 107 03/13/2022   CO2 20 (L) 03/13/2022      Assessment/Plan: SIRS Probable PNA Afebrile overnight-nontoxic-appearing Suspected to have PNA-however radiological studies not very definitive. No other sources of infection apparent Plan is to continue empiric antibiotics-follow cultures-and follow clinical course. If no improvement-we can consider further workup.  Chronic combined HFpEF/HFrEF Euvolemic By cardiology note-did not tolerate Entresto Continue Lasix/beta-blocker  Chronic atrial fibrillation Rate controlled with metoprolol Continue Xarelto  CAD s/p CABG 1998-s/p PCI 2008 No anginal symptoms  Mild aortic stenosis Supportive care  CKD stage IIIa At baseline Follow electrolytes periodically  Acute urinary retention BPH Had acute urinary retention on admission-Foley catheter placed Mobilize-a voiding trial over the next day or so. Continue Flomax Add finasteride as-as BP soft unable to increase finasteride further  HLD Continue Crestor  VTE Continue Xarelto  History of subdural hematoma following a fall 2018 History of CVA Supportive care PT/OT eval  Normocytic anemia Likely due to chronic disease Slight drop in hemoglobin overnight likely due to hemodilution No evidence of GI bleeding/blood loss Follow CBC  Thrombocytopenia Mild-appears to be a chronic issue Follow CBC  BPH Continue Flomax  GERD PPI  Mood disorder/depression Continue Prozac  Age indeterminate L1 compression fracture Incidental finding on CT abdomen/pelvis Supportive care  Debility/deconditioning PT/OT eval  Pressure Ulcer: Pressure Injury 03/12/22 Coccyx Mid Stage 1 -  Intact skin with non-blanchable redness of a localized area usually over a  bony prominence. (Active)  03/12/22 1800  Location: Coccyx  Location Orientation: Mid  Staging: Stage 1 -  Intact skin with non-blanchable redness of a localized area usually over a  bony prominence.  Wound Description (Comments):   Present on Admission: Yes  Dressing Type Gauze (Comment) 03/13/22 0900    BMI: Estimated body mass index is 28.7 kg/m as calculated from the following:   Height as of 02/15/22: 5\' 3"  (1.6 m).   Weight as of this encounter: 73.5 kg.   Code status:   Code Status: Full Code   DVT Prophylaxis: Rivaroxaban (XARELTO) tablet 15 mg     Family Communication:  Son at bedside   Disposition Plan: Status is: Inpatient Remains inpatient appropriate because: Possible PNA-not yet stable for discharge-see above documentation.   Planned Discharge Destination:Home health   Diet: Diet Order             Diet Heart Room service appropriate? Yes; Fluid consistency: Thin  Diet effective now                     Antimicrobial agents: Anti-infectives (From admission, onward)    Start     Dose/Rate Route Frequency Ordered Stop   03/12/22 2000  cefTRIAXone (ROCEPHIN) 1 g in sodium chloride 0.9 % 100 mL IVPB        1 g 200 mL/hr over 30 Minutes Intravenous Every 24 hours 03/12/22 1521     03/12/22 1600  azithromycin (ZITHROMAX) 500 mg in sodium chloride 0.9 % 250 mL IVPB        500 mg 250 mL/hr over 60 Minutes Intravenous Every 24 hours 03/12/22 1521     03/12/22 1145  ceFEPIme (MAXIPIME) 2 g in sodium chloride 0.9 % 100 mL IVPB        2 g 200 mL/hr over 30 Minutes Intravenous  Once 03/12/22 1137 03/12/22 1254   03/12/22 1145  metroNIDAZOLE (FLAGYL) IVPB 500 mg        500 mg 100 mL/hr over 60 Minutes Intravenous  Once 03/12/22 1137 03/12/22 1407   03/12/22 1145  vancomycin (VANCOCIN) IVPB 1000 mg/200 mL premix  Status:  Discontinued        1,000 mg 200 mL/hr over 60 Minutes Intravenous  Once 03/12/22 1137 03/12/22 1144   03/12/22 1145  vancomycin (VANCOREADY) IVPB 1500 mg/300 mL        1,500 mg 150 mL/hr over 120 Minutes Intravenous  Once 03/12/22 1144 03/12/22 1632        MEDICATIONS: Scheduled Meds:  FLUoxetine  20 mg Oral  Daily   metoprolol tartrate  12.5 mg Oral Daily   pantoprazole  40 mg Oral Daily   Rivaroxaban  15 mg Oral Q supper   rosuvastatin  5 mg Oral Daily   sodium chloride flush  3 mL Intravenous Q12H   tamsulosin  0.4 mg Oral Daily   Continuous Infusions:  azithromycin Stopped (03/12/22 1855)   cefTRIAXone (ROCEPHIN)  IV Stopped (03/12/22 2144)   PRN Meds:.acetaminophen **OR** acetaminophen, mouth rinse, polyethylene glycol   I have personally reviewed following labs and imaging studies  LABORATORY DATA: CBC: Recent Labs  Lab 03/12/22 1148 03/12/22 1203 03/13/22 0031  WBC 11.2*  --  6.7  NEUTROABS 9.7*  --   --   HGB 10.5* 11.2*  11.2* 8.6*  HCT 32.8* 33.0*  33.0* 25.9*  MCV 99.7  --  98.5  PLT 118*  --  85*    Basic Metabolic Panel: Recent Labs  Lab 03/12/22 1148 03/12/22 1203 03/13/22 0031  NA 135 135  135 135  K 4.3 4.2  4.2 3.8  CL 104 103 107  CO2 18*  --  20*  GLUCOSE 115* 113* 95  BUN 18 18 18   CREATININE 1.53* 1.40* 1.30*  CALCIUM 9.0  --  8.2*  MG 1.9  --   --     GFR: Estimated Creatinine Clearance: 32.6 mL/min (A) (by C-G formula based on SCr of 1.3 mg/dL (H)).  Liver Function Tests: Recent Labs  Lab 03/12/22 1148 03/13/22 0031  AST 23 15  ALT 8 8  ALKPHOS 94 63  BILITOT 1.6* 0.9  PROT 7.2 5.3*  ALBUMIN 3.6 2.5*   No results for input(s): "LIPASE", "AMYLASE" in the last 168 hours. No results for input(s): "AMMONIA" in the last 168 hours.  Coagulation Profile: Recent Labs  Lab 03/12/22 1148 03/13/22 0031  INR 2.4* 2.2*    Cardiac Enzymes: No results for input(s): "CKTOTAL", "CKMB", "CKMBINDEX", "TROPONINI" in the last 168 hours.  BNP (last 3 results) Recent Labs    06/25/21 0911  PROBNP 3,088*    Lipid Profile: No results for input(s): "CHOL", "HDL", "LDLCALC", "TRIG", "CHOLHDL", "LDLDIRECT" in the last 72 hours.  Thyroid Function Tests: No results for input(s): "TSH", "T4TOTAL", "FREET4", "T3FREE", "THYROIDAB" in the  last 72 hours.  Anemia Panel: No results for input(s): "VITAMINB12", "FOLATE", "FERRITIN", "TIBC", "IRON", "RETICCTPCT" in the last 72 hours.  Urine analysis:    Component Value Date/Time   COLORURINE YELLOW 03/12/2022 1259   APPEARANCEUR HAZY (A) 03/12/2022 1259   LABSPEC 1.013 03/12/2022 1259   PHURINE 7.0 03/12/2022 1259   GLUCOSEU NEGATIVE 03/12/2022 1259   HGBUR SMALL (A) 03/12/2022 1259   BILIRUBINUR NEGATIVE 03/12/2022 1259   KETONESUR 5 (A) 03/12/2022 1259   PROTEINUR NEGATIVE 03/12/2022 1259   UROBILINOGEN 0.2 07/13/2014 1804   NITRITE NEGATIVE 03/12/2022 1259   LEUKOCYTESUR NEGATIVE 03/12/2022 1259    Sepsis Labs: Lactic Acid, Venous    Component Value Date/Time   LATICACIDVEN 1.8 03/12/2022 1443    MICROBIOLOGY: Recent Results (from the past 240 hour(s))  Resp panel by RT-PCR (RSV, Flu A&B, Covid) Anterior Nasal Swab     Status: None   Collection Time: 03/12/22 11:36 AM   Specimen: Anterior Nasal Swab  Result Value Ref Range Status   SARS Coronavirus 2 by RT PCR NEGATIVE NEGATIVE Final    Comment: (NOTE) SARS-CoV-2 target nucleic acids are NOT DETECTED.  The SARS-CoV-2 RNA is generally detectable in upper respiratory specimens during the acute phase of infection. The lowest concentration of SARS-CoV-2 viral copies this assay can detect is 138 copies/mL. A negative result does not preclude SARS-Cov-2 infection and should not be used as the sole basis for treatment or other patient management decisions. A negative result may occur with  improper specimen collection/handling, submission of specimen other than nasopharyngeal swab, presence of viral mutation(s) within the areas targeted by this assay, and inadequate number of viral copies(<138 copies/mL). A negative result must be combined with clinical observations, patient history, and epidemiological information. The expected result is Negative.  Fact Sheet for Patients:   EntrepreneurPulse.com.au  Fact Sheet for Healthcare Providers:  IncredibleEmployment.be  This test is no t yet approved or cleared by the [place] FDA and  has been authorized for detection and/or diagnosis of SARS-CoV-2 by FDA under an Emergency Use Authorization (EUA). This EUA will remain  in effect (meaning this test can be used) for the duration of the  COVID-19 declaration under Section 564(b)(1) of the Act, 21 U.S.C.section 360bbb-3(b)(1), unless the authorization is terminated  or revoked sooner.       Influenza A by PCR NEGATIVE NEGATIVE Final   Influenza B by PCR NEGATIVE NEGATIVE Final    Comment: (NOTE) The Xpert Xpress SARS-CoV-2/FLU/RSV plus assay is intended as an aid in the diagnosis of influenza from Nasopharyngeal swab specimens and should not be used as a sole basis for treatment. Nasal washings and aspirates are unacceptable for Xpert Xpress SARS-CoV-2/FLU/RSV testing.  Fact Sheet for Patients: EntrepreneurPulse.com.au  Fact Sheet for Healthcare Providers: IncredibleEmployment.be  This test is not yet approved or cleared by the [place] FDA and has been authorized for detection and/or diagnosis of SARS-CoV-2 by FDA under an Emergency Use Authorization (EUA). This EUA will remain in effect (meaning this test can be used) for the duration of the COVID-19 declaration under Section 564(b)(1) of the Act, 21 U.S.C. section 360bbb-3(b)(1), unless the authorization is terminated or revoked.     Resp Syncytial Virus by PCR NEGATIVE NEGATIVE Final    Comment: (NOTE) Fact Sheet for Patients: EntrepreneurPulse.com.au  Fact Sheet for Healthcare Providers: IncredibleEmployment.be  This test is not yet approved or cleared by the [place] FDA and has been authorized for detection and/or diagnosis of SARS-CoV-2 by FDA under an Emergency Use  Authorization (EUA). This EUA will remain in effect (meaning this test can be used) for the duration of the COVID-19 declaration under Section 564(b)(1) of the Act, 21 U.S.C. section 360bbb-3(b)(1), unless the authorization is terminated or revoked.  Performed at [facility], [place] [address] [place], [place] [address]   Urine Culture     Status: Abnormal   Collection Time: 03/12/22  2:03 PM   Specimen: In/Out Cath Urine  Result Value Ref Range Status   Specimen Description IN/OUT CATH URINE  Final   Special Requests   Final    NONE Performed at [facility], [place] [address] [place], [place] [address]    Culture MULTIPLE SPECIES PRESENT, SUGGEST RECOLLECTION (A)  Final   Report Status 03/13/2022 FINAL  Final    RADIOLOGY STUDIES/RESULTS: CT CHEST ABDOMEN PELVIS WO CONTRAST  Result Date: 03/12/2022 CLINICAL DATA:  Generalized weakness and dysuria, suspicion for sepsis EXAM: CT CHEST, ABDOMEN AND PELVIS WITHOUT CONTRAST TECHNIQUE: Multidetector CT imaging of the chest, abdomen and pelvis was performed following the standard protocol without IV contrast. RADIATION DOSE REDUCTION: This exam was performed according to the departmental dose-optimization program which includes automated exposure control, adjustment of the mA and/or kV according to patient size and/or use of iterative reconstruction technique. COMPARISON:  CT chest dated 02/03/2020, CT abdomen and pelvis dated 07/13/2014 FINDINGS: CT CHEST FINDINGS Technically challenging examination for evaluation of detailed findings due to patient respiratory motion. Cardiovascular: Multichamber cardiomegaly. No significant pericardial fluid/thickening. Coronary artery calcifications. Great vessels are normal in course and caliber. Mediastinum/Nodes: Thyroid gland without nodules meeting criteria for imaging follow-up by size. Normal esophagus. No pathologically enlarged axillary, supraclavicular, mediastinal, or hilar lymph nodes.  Lungs/Pleura: The central airways are patent. No focal consolidation. Similar lower lung predominant subpleural reticulations. No pneumothorax. Similar trace pleural effusion along the medial right lower lobe and adjacent opacity, likely reflecting to prior treatment related changes. Musculoskeletal: No acute or abnormal lytic or blastic osseous lesions. CT ABDOMEN PELVIS FINDINGS Hepatobiliary: No focal hepatic lesions. No intra or extrahepatic biliary ductal dilation. Cholecystectomy. Pancreas: No focal lesions or main ductal dilation. Spleen: Normal in size without  focal abnormality. Adrenals/Urinary Tract: No adrenal nodules. Duplex right kidney. No suspicious renal mass, calculi, or hydronephrosis. Distended urinary bladder. Stomach/Bowel: Normal appearance of the stomach. No evidence of bowel wall thickening, distention, or inflammatory changes. The appendix is not discretely seen. colonic diverticulosis without acute diverticulitis. Vascular/Lymphatic: Aortic atherosclerosis. No enlarged abdominal or pelvic lymph nodes. Reproductive: Prostate is unremarkable. Other: No free fluid, fluid collection, or free air. Musculoskeletal: Median sternotomy wires are nondisplaced. No acute or abnormal lytic or blastic osseous findings. Age indeterminate L1 compression deformity is new since 07/13/2014. Unchanged grade 1 anterolisthesis at L4-5. IMPRESSION: 1. Technically challenging examination for evaluation of detailed findings due to patient respiratory motion. No acute abnormality in the chest, abdomen, or pelvis. 2. Distended urinary bladder. Consider correlation with bladder outlet obstruction. 3. Age indeterminate L1 compression deformity is new since 07/13/2014. Correlate with point tenderness. 4. Aortic Atherosclerosis (ICD10-I70.0). Electronically Signed   By: Darrin Nipper M.D.   On: 03/12/2022 12:59   CT Head Wo Contrast  Result Date: 03/12/2022 CLINICAL DATA:  Altered mental status EXAM: CT HEAD WITHOUT  CONTRAST TECHNIQUE: Contiguous axial images were obtained from the base of the skull through the vertex without intravenous contrast. RADIATION DOSE REDUCTION: This exam was performed according to the departmental dose-optimization program which includes automated exposure control, adjustment of the mA and/or kV according to patient size and/or use of iterative reconstruction technique. COMPARISON:  CT Head 03/09/19 FINDINGS: Brain: No evidence of acute infarction, hemorrhage, hydrocephalus, extra-axial collection or mass lesion/mass effect. Redemonstrated chronic small-vessel infarcts in the right corona radiata, left thalamus, and right cerebellum. Sequela of mild chronic microvascular ischemic change. Unchanged prominence of the subarachnoid space on the bilateral frontal lobes. Vascular: No hyperdense vessel or unexpected calcification. Skull: Normal. Negative for fracture or focal lesion. Sinuses/Orbits: No acute finding. Bilateral lens replacement. Air-fluid level in the left maxillary sinus with osseous findings suggestive of chronic underlying sinusitis. Other: None. IMPRESSION: 1. No acute intracranial abnormality. 2. Air-fluid level in the left maxillary sinus with osseous findings suggestive of chronic underlying sinusitis. Electronically Signed   By: Marin Roberts M.D.   On: 03/12/2022 12:49   DG Chest Port 1 View  Result Date: 03/12/2022 CLINICAL DATA:  Possible sepsis EXAM: PORTABLE CHEST 1 VIEW COMPARISON:  CXR 02/15/22 FINDINGS: Status post median sternotomy. No pleural effusion. No pneumothorax. Unchanged cardiac and mediastinal contours. Compared to prior exam there is a new hazy opacity at the right costophrenic angle. Degenerative changes of the right AC joint. Displaced rib fractures. Visualized upper abdomen is radiographically unremarkable. IMPRESSION: Hazy opacity at the right costophrenic angle could represent atelectasis or infection. Electronically Signed   By: Marin Roberts M.D.   On:  03/12/2022 12:09     LOS: 1 day   Oren Binet, MD  [facility]    To contact the attending provider between 7A-7P or the covering provider during after hours 7P-7A, please log into the web site [email or web address] and access using universal [place] password for that web site. If you do not have the password, please call the hospital operator.  03/13/2022, 9:55 AM

## 2022-03-13 NOTE — Evaluation (Signed)
Occupational Therapy Evaluation Patient Details Name: Evan [name] MRN: [id number] DOB: May 09, 1927 Today's Date: 03/13/2022   History of Present Illness Pt is a 88 y.o. male who presented 03/12/22 with weakness, shaking, fever, and cough. Admitted with probable PNA. Noted age indeterminate L1 compression fx with imaging. 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 His daughter is present and assist with hx given pt HOH. She states he is able to ambulate with RW with RUE platform but is concerned it makes him off balance. She reports he requires assist for toileting, bathing, dressing but is able to participate with UB bathing and dressing tasks. She states that he has 24 hr s/a from family members and all necessary equip at home including hospital bed. Pt needing min A for bed mobility and reports feeling somewhat weak. He recently finished working with PT and was able to ambulate in room. Pt will benefit from acute OT services to progress back to baseline with ADLs and mobility as well as assist with increasing indep with ADL tasks to decrease burden of care on family. Would benefit from [facility] to assess home environment as well as identify additional AE or DME in the home. Will follow acutely.       Recommendations for follow up therapy are one component of a multi-disciplinary discharge planning process, led by the attending physician.  Recommendations may be updated based on patient status, additional functional criteria and insurance authorization.   Follow Up Recommendations  Home health OT     Assistance Recommended at Discharge Frequent or constant Supervision/Assistance  Patient can return  home with the following A lot of help with walking and/or transfers;A lot of help with bathing/dressing/bathroom;Assistance with cooking/housework;Direct supervision/assist for medications management;Direct supervision/assist for financial management;Assist for transportation    Functional Status Assessment  Patient has had a recent decline in their functional status and/or demonstrates limited ability to make significant improvements in function in a reasonable and predictable amount of time  Equipment Recommendations  None recommended by OT       Precautions / Restrictions Precautions Precautions: Fall;Other (comment) Precaution Comments: watch SpO2; R hemi prior CVA (AFO in room) Restrictions Weight Bearing Restrictions: No Other Position/Activity Restrictions: no need for back brace per Dr. Sloan Leiter 03/13/22      Mobility Bed Mobility Overal bed mobility: Needs Assistance Bed Mobility: Supine to Sit, Sit to Supine     Supine to sit: Min assist, HOB elevated Sit to supine: Min assist, HOB elevated              Balance Overall balance assessment: Needs assistance Sitting-balance support: Feet supported, Single extremity supported Sitting balance-[name] Scale: Fair Sitting balance - Comments: posterior lean without UE support         ADL either performed or assessed with clinical judgement   ADL Overall ADL's : Needs assistance/impaired Eating/Feeding: Set up   Grooming: Set up;Bed level;Wash/dry hands   Upper Body Bathing: Moderate assistance;Sitting   Lower Body Bathing: Maximal assistance;Bed level   Upper Body Dressing : Minimal assistance;Sitting   Lower Body Dressing: Maximal assistance;Sit to/from stand   Toilet Transfer: Moderate assistance;BSC/3in1   Toileting- Clothing Manipulation and Hygiene: Maximal assistance;Sit to/from stand  Functional mobility during ADLs: Minimal assistance;Moderate assistance       Vision Baseline  Vision/History: 1 Wears glasses Ability to See in Adequate Light: 0 Adequate Patient Visual Report: No change from baseline Vision Assessment?: No apparent visual deficits            Pertinent Vitals/Pain Pain Assessment Pain Assessment: No/denies pain     Hand Dominance Left   Extremity/Trunk Assessment Upper Extremity Assessment Upper Extremity Assessment: RUE deficits/detail (R hemi from prior CVA, holding R elbow in flexion throughout, L shoulder range decreased due to arthritis, flexion/abduction limited to approx 120 degrees) RUE Deficits / Details: weakness from prior CVA, daughter reports appears to be baseline RUE Sensation: decreased proprioception RUE Coordination: decreased fine motor;decreased gross motor   Lower Extremity Assessment Lower Extremity Assessment: RLE deficits/detail;Generalized weakness RLE Deficits / Details: incoordination and weakness noted from prior CVA, tends to extend leg when focusing on other tasks or mobility; AFO in room   Cervical / Trunk Assessment Cervical / Trunk Assessment: Kyphotic   Communication Communication Communication: HOH (hearing aid)   Cognition     Overall Cognitive Status: History of cognitive impairments - at baseline           General Comments  A fib, HR 58-65             Home Living Family/patient expects to be discharged to:: Private residence Living Arrangements: Alone (but family switches out to be there 24/7) Available Help at Discharge: Family;Available 24 hours/day Type of Home: House Home Access: Ramped entrance     Home Layout: Two level;Able to live on main level with bedroom/bathroom (does not go upstairs)     Bathroom Shower/Tub: Occupational psychologist: Handicapped height Bathroom Accessibility: No (holds onto family's hands to go in bathroom)   Home Equipment: Conservation officer, nature (2 wheels);Hospital bed;Wheelchair - manual;BSC/3in1;Shower seat (R platform RW)          Prior  Functioning/Environment Prior Level of Function : Needs assist       Physical Assist : ADLs (physical);Mobility (physical) Mobility (physical): Transfers;Gait;Bed mobility ADLs (physical): Bathing;Dressing;Toileting;IADLs Mobility Comments: Family present 24/7 to assist pt as needed with all mobility and tasks, but typically only needs min guard assist to mobilize with R platform RW. Uses AFO ADLs Comments: Family assists with LB dressing and bathing and all iADLs        OT Problem List: Decreased activity tolerance;Decreased strength;Impaired balance (sitting and/or standing)      OT Treatment/Interventions: Self-care/ADL training;DME and/or AE instruction;Therapeutic activities;Patient/family education    OT Goals(Current goals can be found in the care plan section) Acute Rehab OT Goals Patient Stated Goal: get stronger OT Goal Formulation: With patient/family Time For Goal Achievement: 03/27/22 Potential to Achieve Goals: Fair ADL Goals Pt Will Perform Grooming: with set-up;sitting Pt Will Perform Upper Body Bathing: sitting;with set-up Pt Will Perform Upper Body Dressing: with set-up;sitting Pt Will Transfer to Toilet: with min guard assist;ambulating  OT Frequency: Min 2X/week       AM-PAC OT "6 Clicks" Daily Activity     Outcome Measure Help from another person eating meals?: A Little Help from another person taking care of personal grooming?: A Little Help from another person toileting, which includes using toliet, bedpan, or urinal?: A Lot Help from another person bathing (including washing, rinsing, drying)?: A Lot Help from another person to put on and taking off regular upper body clothing?: A Little Help from another person to put on and taking off  regular lower body clothing?: A Lot 6 Click Score: 15   End of Session    Activity Tolerance: Patient limited by fatigue Patient left: in bed;with family/visitor present  OT Visit Diagnosis: Unsteadiness on feet  (R26.81);Muscle weakness (generalized) (M62.81);Hemiplegia and hemiparesis Hemiplegia - Right/Left: Right                Time: 3888-2800 OT Time Calculation (min): 16 min Charges:  OT General Charges $OT Visit: 1 Visit OT Evaluation $OT Eval Low Complexity: [place], OTR/L 03/13/2022, 4:08 PM

## 2022-03-13 NOTE — Evaluation (Signed)
Physical Therapy Evaluation Patient Details Name: Evan [name] MRN: [id number] DOB: [birth year] Today's Date: 03/13/2022  History of Present Illness  Pt is a 88 y.o. male who presented 03/12/22 with weakness, shaking, fever, and cough. Admitted with probable PNA. Noted age indeterminate L1 compression fx with imaging. 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 PTA, he was requiring min guard assist using a R platform RW for all functional mobility and intermittent assistance from family as needed. Pt lives alone (but family swaps out so someone is there 24/7) in a 2-level house (only stays on main level) with a ramped entrance. Pt has a hx of R-sided weakness and incoordination due to a prior CVA, thereby requiring an AFO and the platform RW. Currently, pt is demonstrating deficits also in activity tolerance/endurance, balance, and overall strength. Pt required minA for bed mobility, min-modA for transfers, and mod-maxA to ambulate in the room today with PT managing the R side of the RW due to a platform RW not being readily available at the time. Pt would likely demonstrate some improvement in balance if provided a platform RW. He is at high risk for falls. Daughter confirmed they can provide the physical level of care pt needs and reported pt tends to do better physically at home and worse so in unfamiliar environments. Recommending pt d/c home with family support and HHPT follow-up. Will continue to follow acutely. Daughter expressed that she thinks pt may due better at home with a bil platform RW as he is tending to lean laterally currently. Will plan to assess pt's mobility with R  platform vs bil platform RW as able.      Recommendations for follow up therapy are one component of a multi-disciplinary discharge planning process, led by the attending physician.  Recommendations may be updated based on patient status, additional functional criteria and insurance authorization.  Follow Up Recommendations Home health PT      Assistance Recommended at Discharge Frequent or constant Supervision/Assistance  Patient can return home with the following  A lot of help with walking and/or transfers;A lot of help with bathing/dressing/bathroom;Assistance with cooking/housework;Direct supervision/assist for medications management;Direct supervision/assist for financial management;Assist for transportation;Help with stairs or ramp for entrance    Equipment Recommendations Other (comment) (potential need for L UE platform attachment for RW)  Recommendations for Other Services       Functional Status Assessment Patient has had a recent decline in their functional status and demonstrates the ability to make significant improvements in function in a reasonable and predictable amount of time.     Precautions / Restrictions Precautions Precautions: Fall;Other (comment) Precaution Comments: watch SpO2; R hemi prior CVA (AFO in room) Restrictions Weight Bearing Restrictions: No Other Position/Activity Restrictions: no need for back brace per Dr. Sloan Leiter 03/13/22      Mobility  Bed Mobility Overal bed mobility: Needs Assistance Bed Mobility: Supine to Sit, Sit to Supine     Supine to sit: Min assist, HOB elevated Sit to supine: Min assist, HOB elevated   General bed mobility comments: Cues provided to bring legs off R EOB and reach to grab R bed rail with L UE, minA to ascend trunk. MinA to lift legs  onto bed with return to supine.    Transfers Overall transfer level: Needs assistance Equipment used: Rolling walker (2 wheels), 1 person hand held assist Transfers: Sit  to/from Stand, Bed to chair/wheelchair/BSC Sit to Stand: Mod assist, Min assist   Step pivot transfers: Mod assist       General transfer comment: ModA the first x2 reps using RW to stand from EOB and x1 to stand from recliner holding onto therapist. Noted posterior lean. MinA to stand x4 reps from EOB with L UE on bed rail for support throughout.    Ambulation/Gait Ambulation/Gait assistance: Mod assist, Max assist Gait Distance (Feet): 20 Feet (x2 bouts of ~20 ft > ~2 ft) Assistive device: Rolling walker (2 wheels), 1 person hand held assist Gait Pattern/deviations: Step-to pattern, Decreased step length - right, Decreased stride length, Decreased dorsiflexion - right, Ataxic, Trunk flexed, Narrow base of support, Leaning posteriorly Gait velocity: reduced Gait velocity interpretation: <1.31 ft/sec, indicative of household ambulator   General Gait Details: Pt with unsteady, ataxic gait with narrow placement of R foot intermittently. R AFO donned throughout. Pt with L lateral and posterior lean, needing modA for stability initially but then needing maxA to prevent LOB when pt fatigued and began to lean more and try to sit early. R side of RW managed by PT due to R elbow flexor hypertonicity and no platform RW readily available.  Stairs            Wheelchair Mobility    Modified Rankin (Stroke Patients Only)       Balance Overall balance assessment: Needs assistance Sitting-balance support: Single extremity supported, Feet supported, No upper extremity supported Sitting balance-[name] Scale: Fair Sitting balance - Comments: Able to sit EOB without UE support but prefers UE support Postural control: Left lateral lean, Posterior lean Standing balance support: Single extremity supported, During functional activity, Reliant on assistive device for balance Standing balance-[name] Scale: Poor Standing balance comment: Reliant on L UE support and external physical assistance                              Pertinent Vitals/Pain Pain Assessment Pain Assessment: Faces Faces Pain Scale: Hurts a little bit Pain Location: buttocks with pericare Pain Descriptors / Indicators: Discomfort Pain Intervention(s): Limited activity within patient's tolerance, Monitored during session    Home Living Family/patient expects to be discharged to:: Private residence Living Arrangements: Alone (but family switches out to be there 24/7) Available Help at Discharge: Family;Available 24 hours/day Type of Home: House Home Access: Ramped entrance       Home Layout: Two level;Able to live on main level with bedroom/bathroom (does not go upstairs) Home Equipment: Conservation officer, nature (2 wheels);Hospital bed;Wheelchair - manual;BSC/3in1;Shower seat (R platform RW)      Prior Function Prior Level of Function : Needs assist       Physical Assist : ADLs (physical);Mobility (physical) Mobility (physical): Transfers;Gait;Bed mobility ADLs (physical): Bathing;Dressing;Toileting;IADLs Mobility Comments: Family present 24/7 to assist pt as needed with all mobility and tasks, but typically only needs min guard assist to mobilize with R platform RW. Uses AFO ADLs Comments: Family assists with LB dressing and bathing and all iADLs     Hand Dominance   Dominant Hand: Left    Extremity/Trunk Assessment   Upper Extremity Assessment Upper Extremity Assessment: Defer to OT evaluation (R hemi from prior CVA, holding R elbow in flexion throughout)    Lower Extremity Assessment Lower Extremity  Assessment: RLE deficits/detail;Generalized weakness RLE Deficits / Details: incoordination and weakness noted from prior CVA, tends to extend leg when focusing on other tasks or mobility; AFO in room    Cervical / Trunk Assessment Cervical / Trunk Assessment: Kyphotic  Communication   Communication: HOH (hearing aid)  Cognition Arousal/Alertness: Awake/alert Behavior During Therapy: WFL for  tasks assessed/performed Overall Cognitive Status: Within Functional Limits for tasks assessed                                 General Comments: Likely at his baseline with delayed processing and needing cues for sequencing.        General Comments General comments (skin integrity, edema, etc.): poor pleth, but reading into 80s% on RA intermittently    Exercises     Assessment/Plan    PT Assessment Patient needs continued PT services  PT Problem List Decreased strength;Decreased activity tolerance;Decreased balance;Decreased mobility;Decreased coordination;Cardiopulmonary status limiting activity       PT Treatment Interventions DME instruction;Gait training;Functional mobility training;Therapeutic activities;Balance training;Therapeutic exercise;Neuromuscular re-education;Patient/family education    PT Goals (Current goals can be found in the Care Plan section)  Acute Rehab PT Goals Patient Stated Goal: to improve PT Goal Formulation: With patient/family Time For Goal Achievement: 03/27/22 Potential to Achieve Goals: Good    Frequency Min 3X/week     Co-evaluation               AM-PAC PT "6 Clicks" Mobility  Outcome Measure Help needed turning from your back to your side while in a flat bed without using bedrails?: A Little Help needed moving from lying on your back to sitting on the side of a flat bed without using bedrails?: A Little Help needed moving to and from a bed to a chair (including a wheelchair)?: A Lot Help needed standing up from a chair using your arms (e.g., wheelchair or bedside chair)?: A Lot Help needed to walk in hospital room?: A Lot Help needed climbing 3-5 steps with a railing? : Total 6 Click Score: 13    End of Session Equipment Utilized During Treatment: Gait belt Activity Tolerance: Patient tolerated treatment well Patient left: in bed;with call bell/phone within reach;Other (comment);with family/visitor present (with IV  team entering) Nurse Communication: Mobility status PT Visit Diagnosis: Unsteadiness on feet (R26.81);Other abnormalities of gait and mobility (R26.89);Muscle weakness (generalized) (M62.81);Difficulty in walking, not elsewhere classified (R26.2);Ataxic gait (R26.0)    Time: 1408-1500 PT Time Calculation (min) (ACUTE ONLY): 52 min   Charges:   PT Evaluation $PT Eval Moderate Complexity: 1 Mod PT Treatments $Gait Training: 8-22 mins $Therapeutic Activity: 8-22 mins        Moishe Spice, PT, DPT Acute Rehabilitation Services  Office: [phone]   Orvan Falconer 03/13/2022, 3:17 PM

## 2022-03-14 DIAGNOSIS — R6510 Systemic inflammatory response syndrome (SIRS) of non-infectious origin without acute organ dysfunction: Secondary | ICD-10-CM

## 2022-03-14 DIAGNOSIS — R338 Other retention of urine: Secondary | ICD-10-CM

## 2022-03-14 LAB — BASIC METABOLIC PANEL
Anion gap: 11 (ref 5–15)
BUN: 23 mg/dL (ref 8–23)
CO2: 18 mmol/L — ABNORMAL LOW (ref 22–32)
Calcium: 8 mg/dL — ABNORMAL LOW (ref 8.9–10.3)
Chloride: 103 mmol/L (ref 98–111)
Creatinine, Ser: 1.41 mg/dL — ABNORMAL HIGH (ref 0.61–1.24)
GFR, Estimated: 47 mL/min — ABNORMAL LOW (ref >60)
Glucose, Bld: 109 mg/dL — ABNORMAL HIGH (ref 70–99)
Potassium: 3.5 mmol/L (ref 3.5–5.1)
Sodium: 132 mmol/L — ABNORMAL LOW (ref 135–145)

## 2022-03-14 LAB — CBC
HCT: 25.4 % — ABNORMAL LOW (ref 39.0–52.0)
Hemoglobin: 8.4 g/dL — ABNORMAL LOW (ref 13.0–17.0)
MCH: 32.3 pg (ref 26.0–34.0)
MCHC: 33.1 g/dL (ref 30.0–36.0)
MCV: 97.7 fL (ref 80.0–100.0)
Platelets: 88 10*3/uL — ABNORMAL LOW (ref 150–400)
RBC: 2.6 MIL/uL — ABNORMAL LOW (ref 4.22–5.81)
RDW: 14.4 % (ref 11.5–15.5)
WBC: 7.8 10*3/uL (ref 4.0–10.5)
nRBC: 0 % (ref 0.0–0.2)

## 2022-03-14 MED ORDER — QUETIAPINE FUMARATE 50 MG PO TABS
50.0000 mg | ORAL_TABLET | Freq: Once | ORAL | Status: AC
  Administered 2022-03-14: 50 mg via ORAL
  Filled 2022-03-14: qty 1

## 2022-03-14 MED ORDER — CHLORHEXIDINE GLUCONATE CLOTH 2 % EX PADS: 6.0000 | MEDICATED_PAD | Freq: Every day | CUTANEOUS | Status: DC

## 2022-03-14 MED ORDER — CALAMINE EX LOTN: 1.0000 | TOPICAL_LOTION | CUTANEOUS | Status: DC | PRN

## 2022-03-14 NOTE — Progress Notes (Signed)
Physical Therapy Treatment Patient Details Name: Evan [name] MRN: [id number] DOB: [birth year] Today's Date: 03/14/2022   History of Present Illness Pt is a 88 y.o. male who presented 03/12/22 with weakness, shaking, fever, and cough. Admitted with probable PNA. Noted age indeterminate L1 compression fx with imaging. 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   PT Comments    Pt progressing with mobility. Today's session focused on transfer training and standing balance, pt requiring min-modA for mobility. Pt with multiple bouts of bowel incontinence, dependent for pericare. Pt remains limited by generalized weakness, decreased activity tolerance, and impaired balance strategies/postural reactions. Pt's daughter present and supportive; reports hopeful plan for d/c home tomorrow. LUE platform attachment delivered to room. Will continue to follow acutely to address established goals.     Recommendations for follow up therapy are one component of a multi-disciplinary discharge planning process, led by the attending physician.  Recommendations may be updated based on patient status, additional functional criteria and insurance authorization.  Follow Up Recommendations  Home health PT     Assistance Recommended at Discharge Frequent or constant Supervision/Assistance  Patient can return home with the following A lot of help with walking and/or transfers;A lot of help with bathing/dressing/bathroom;Assistance with cooking/housework;Direct supervision/assist for medications management;Direct supervision/assist for financial management;Assist for transportation;Help with stairs or ramp for entrance   Equipment Recommendations  None recommended by PT    Recommendations for Other Services        Precautions / Restrictions Precautions Precautions: Fall;Other (comment) Precaution Comments: h/o CVA with R hemi (AFO in room)     Mobility  Bed Mobility Overal bed mobility: Needs Assistance Bed Mobility: Supine to Sit, Sit to Supine     Supine to sit: Min assist, HOB elevated Sit to supine: Min assist   General bed mobility comments: minA for HHA to elevate trunk and scoot hips to EOB; minA for RLE management return to supine and trunk repositioning    Transfers Overall transfer level: Needs assistance Equipment used: 1 person hand held assist (platform rollator) Transfers: Sit to/from Stand, Bed to chair/wheelchair/BSC Sit to Stand: Mod assist, Min assist   Step pivot transfers: Mod assist       General transfer comment: initial stand from EOB to bilateral platform rollator with modA for trunk elevation and stability, assist to position BUEs, pt c/o needing to have BM requiring return to sit for transfer to [facility]; modA for step pivot transfer bed<>BSC with HHA; 3x sit<>stands from [facility] for pericare with HHA and min-modA, all with AFO donned. additional 3x sit<>stands from EOB without AFO (shoes soiled) with min-modA for trunk elevation and stability    Ambulation/Gait               General Gait Details:  (focused on transfer training and standing balance since shoes with AFO soiled due to bowel incontinence)   Stairs             Wheelchair Mobility    Modified [facility] (Stroke Patients Only)       Balance Overall balance assessment: Needs assistance Sitting-balance support: Feet supported, No upper extremity supported Sitting balance-[name] Scale: Fair     Standing balance support: Single extremity supported, During functional activity, Reliant on assistive device for balance Standing balance-[name] Scale: Poor Standing balance comment: reliant on UE support  and external assist; bouts of standing with HHA and min guard, though frequent LOB with  repositioning BOS requiring assist to correct                            Cognition Arousal/Alertness: Awake/alert Behavior During Therapy: WFL for tasks assessed/performed Overall Cognitive Status: History of cognitive impairments - at baseline                                 General Comments: Likely at his baseline with delayed processing and needing cues for sequencing, exacerbated by [facility] very pleasant and agreeable        Exercises      General Comments General comments (skin integrity, edema, etc.): pt's daughter present and supportive. LUE platform attachment for RW already delivered      Pertinent Vitals/Pain Pain Assessment Pain Assessment: No/denies pain Pain Intervention(s): Monitored during session    Home Living                          Prior Function            PT Goals (current goals can now be found in the care plan section) Progress towards PT goals: Progressing toward goals    Frequency    Min 3X/week      PT Plan Current plan remains appropriate    Co-evaluation              AM-PAC PT "6 Clicks" Mobility   Outcome Measure  Help needed turning from your back to your side while in a flat bed without using bedrails?: A Little Help needed moving from lying on your back to sitting on the side of a flat bed without using bedrails?: A Little Help needed moving to and from a bed to a chair (including a wheelchair)?: A Lot Help needed standing up from a chair using your arms (e.g., wheelchair or bedside chair)?: A Lot Help needed to walk in hospital room?: A Lot Help needed climbing 3-5 steps with a railing? : Total 6 Click Score: 13    End of Session Equipment Utilized During Treatment: Gait belt Activity Tolerance: Patient tolerated treatment well Patient left: in bed;with call bell/phone within reach;with family/visitor present Nurse Communication: Mobility status PT Visit Diagnosis: Unsteadiness on  feet (R26.81);Other abnormalities of gait and mobility (R26.89);Muscle weakness (generalized) (M62.81);Difficulty in walking, not elsewhere classified (R26.2);Ataxic gait (R26.0)     Time: 9450-3888 PT Time Calculation (min) (ACUTE ONLY): 30 min  Charges:  $Therapeutic Activity: 23-37 mins                     Mabeline Caras, PT, DPT Acute Rehabilitation Services  Personal: [facility] Office: [place] 03/14/2022, 4:39 PM

## 2022-03-14 NOTE — TOC Initial Note (Addendum)
Transition of Care [facility]) - Initial/Assessment Note    Patient Details  Name: Evan [name] MRN: [id number] Date of Birth: 10/30/1927  [facility] CM/SW Contact:    Marilu Favre, RN Phone Number: 03/14/2022, 11:03 AM  Clinical Narrative:                  Spoke to patient and daughter [name] at bedside. Patient from home alone , however family stay with him   Patient has walker at home. Ordered left UE platform attachment for walker through [facility]   Patient has shower chair and wheelchair at home. Daughter states wheelchair "falling apart". Patient received wheelchair through [facility] and his insurance 3 years ago . Insurance covers wheelchair every 5 years. Encouraged daughter to call [facility] and they will assess wheelchair for repairs/ replaced. NCM mentioned same to [employer] also.   Patient currently has oxygen on does not have home oxygen, will need ambulatory saturation note and order for home oxygen if needed.   Patient has bedside commode at home   In progression reported patient may go home with foley catheter. Bedside nurse will provide foley catheter education to family prior to discharge. Daughter voiced understanding   Patient was to start with [place] [facility] today. NCM called [facility] and updated Kelly. Claiborne Billings requesting MD add [facility] to order. NCM messaged MD.    Expected Discharge Plan: [place] Barriers to Discharge: Continued Medical Work up   Patient Goals and CMS Choice Patient states their goals for this hospitalization and ongoing recovery are:: to return to home CMS Medicare.gov Compare Post Acute Care list provided to:: Patient Choice offered to / list presented to : Patient  Expected Discharge Plan and Services Expected Discharge Plan: [place]   Discharge Planning Services: CM Consult Post Acute Care Choice: [place] arrangements for the past 2 months:  Single Family Home                 DME Arranged: Other see comment (left UE platform attachment for walker) DME Agency: [employer] Date DME Agency Contacted: 03/14/22 Time DME Agency Contacted: 69 Representative spoke with at DME Agency: Erasmo Downer HH Arranged: OT, PT, RN [place] Agency: Lane Date [place]: 03/14/22 Time [place]: 71 Representative spoke with at [place]: [place] Arrangements/Services Living arrangements for the past 2 months: [place] with:: Self Patient language and need for interpreter reviewed:: Yes        Need for Family Participation in Patient Care: Yes (Comment) Care giver support system in place?: Yes (comment) Current home services: DME Criminal Activity/Legal Involvement Pertinent to Current Situation/Hospitalization: No - Comment as needed  Activities of Daily Living      Permission Sought/Granted   Permission granted to share information with : Yes, Verbal Permission Granted  Share Information with NAME: daughter [name]  Permission granted to share info w AGENCY: [facility]        Emotional Assessment Appearance:: Appears stated age Attitude/Demeanor/Rapport: Engaged Affect (typically observed): Accepting Orientation: : Oriented to Self, Oriented to Place, Oriented to  Time, Oriented to Situation Alcohol / Substance Use: Not Applicable Psych Involvement: No (comment)  Admission diagnosis:  Pneumonia [J18.9] SIRS (systemic inflammatory response syndrome) (HCC) [R65.10] Generalized weakness [R53.1] Patient Active Problem List   Diagnosis Date Noted   Pneumonia 03/12/2022   Hemiparesis due to old stroke ([place]) 06/25/2021  Mild aortic stenosis 06/23/2021   Rib pain on right side 03/19/2020   Sundowning 07/04/2019   Bilateral hearing loss 04/16/2019   Urinary frequency 03/21/2019   Closed wedge fracture of lumbar vertebra ([place]) 03/14/2019   Chronic right shoulder pain  12/19/2018   Weakness generalized 09/28/2018   Palliative care encounter 09/28/2018   Pressure injury of skin 08/27/2018   COPD (chronic obstructive pulmonary disease) ([place]) 08/26/2018   Altered mental status 08/21/2018   UTI (urinary tract infection) 08/21/2018   Abnormality of gait 08/20/2018   Prolonged Q-T interval on ECG    Dysphagia, post-stroke    Supplemental oxygen dependent    Fatigue    SOB (shortness of breath)    Hyperglycemia    History of lung cancer    Depression    Stage 3a chronic kidney disease ([place])    Acute blood loss anemia    Fall 07/16/2018   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   Thalamic hemorrhage ([place]) 07/16/2018   Acute spontaneous 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 ([place]) L thalamic 07/14/2018   Cellulitis of arm, right 06/01/2018   Rash and nonspecific skin eruption 06/01/2018   Chest pain 02/02/2018   Thrombocytopenia ([place]) 02/02/2018   Hypokalemia 02/02/2018   Pulmonary embolism ([place]) 02/02/2018   Elevated troponin 02/02/2018   Old MI (myocardial infarction) 06/27/2017   HFmrEF (heart failure with mildly reduced ejection fraction) ([place]) 06/26/2017   Dyspnea 06/21/2017   Acute bronchitis 05/15/2017   Oral candidiasis 05/15/2017   History of pericarditis    Chest pain at rest 12/20/2016   Radiation pneumonitis ([place]) 11/09/2016   Pleurisy 10/11/2016   Chest wall pain 10/11/2016   Subdural hematoma ([place]) 09/13/2016   Permanent atrial fibrillation ([place]) 05/21/2016   Adenocarcinoma of right lung ([place]) 01/06/2016   GAD (generalized anxiety disorder) 11/20/2015   Hypotension    Cellulitis 09/04/2015   Cellulitis of left axilla    Esophageal ulcer without bleeding    Bilateral lower extremity edema 04/28/2014   Coronary artery disease involving native coronary artery of native heart without angina pectoris 09/09/2013   BPH (benign prostatic hyperplasia) 10/08/2012   EKG abnormalities 09/05/2012   Tremor  09/05/2012   Chronic fatigue 09/05/2012   Arthritis    Asthma, chronic    Reflux esophagitis    S/P CABG (coronary artery bypass graft)    GERD (gastroesophageal reflux disease)    Hyperlipidemia LDL goal <70    Essential hypertension    UNSPECIFIED PERIPHERAL VASCULAR DISEASE 06/16/2009   PCP:  [name], MD Pharmacy:   [place], [place] [address] Phone: [phone] Fax: [place] [address] [place] [address] Phone: [phone] Fax: [phone]  [id number] - [place], [place] - [address] [place] [address] Phone: [phone] Fax: [phone]  [id number] - [place], [place] - [address] [name] [address] [place] [address] Phone: [phone] Fax: [phone]     Social Determinants of Health (SDOH) Interventions    Readmission Risk Interventions     No data to display

## 2022-03-14 NOTE — Progress Notes (Signed)
Pt is agitated, pulling his wound  dressing, PIV and purewick.  Pt does not follow command. DR Bridgett Larsson was notified.

## 2022-03-14 NOTE — Progress Notes (Signed)
PROGRESS NOTE        PATIENT DETAILS Name: SCHYLER COUNSELL Age: 88 y.o. Sex: male Date of Birth: May 03, 1927 Admit Date: 03/12/2022 Admitting Physician Marcelyn Bruins, MD IEP:PIRJJOA, Cammie Mcgee, MD  Brief Summary: Patient is a 88 y.o.  male with history of CKD stage IIIa, prior CVA, CAD s/p CABG, PE on anticoagulation, A-fib, HLD, BPH, essential tremor-who presented with subjective fever, chills-cough-thought to have PNA and subsequently admitted to the hospitalist service.  Significant events: 12/09>> admit to [facility]-SIRS likely due to PNA.  Significant studies: 01/10>> echo: EF 45-50% 12/09>> CXR: Hazy opacity right costophrenic angle. 12/09>> CT head: No acute intracranial malady 12/09>> CT chest/abdomen/pelvis: Poor study-no acute abnormality in chest/abdomen/pelvis.  Distended urinary bladder.  Age-indeterminate L1 compression fracture.  Significant microbiology data: 12/09>> COVID/influenza/RSV: Negative 12/09>> urine culture: Multiple species 12/09>> blood culture: No growth  Procedures: None  Consults: None  Subjective: Much more awake alert compared to yesterday-eating breakfast.  Feels much better.  Son at bedside.  Objective: Vitals: Blood pressure 109/61, pulse (!) 58, temperature 98.2 F (36.8 C), temperature source Oral, resp. rate 18, weight 76.4 kg, SpO2 99 %.   Exam: Gen Exam:Alert awake-not in any distress HEENT:atraumatic, normocephalic Chest: B/L clear to auscultation anteriorly CVS:S1S2 regular Abdomen:soft non tender, non distended Extremities:no edema Neurology: Non focal Skin: no rash  Pertinent Labs/Radiology:    Latest Ref Rng & Units 03/14/2022   12:29 AM 03/13/2022   12:31 AM 03/12/2022   12:03 PM  CBC  WBC 4.0 - 10.5 K/uL 7.8  6.7    Hemoglobin 13.0 - 17.0 g/dL 8.4  8.6  11.2    11.2   Hematocrit 39.0 - 52.0 % 25.4  25.9  33.0    33.0   Platelets 150 - 400 K/uL 88  85      Lab Results  Component Value  Date   NA 132 (L) 03/14/2022   K 3.5 03/14/2022   CL 103 03/14/2022   CO2 18 (L) 03/14/2022      Assessment/Plan: SIRS Probable PNA Afebrile-nontoxic-appearing  Improving on empiric antibiotics-although thought to have PNA-radiological studies not definitive  Since no other source apparent-and clinically improving-plan is to continue with empiric antibiotics.   Continue IV antibiotics for another day-if continues to improve-possibly can convert to oral antimicrobial therapy tomorrow.  Chronic combined HFpEF/HFrEF Euvolemic By cardiology note-did not tolerate Entresto Continue Lasix/beta-blocker  Chronic atrial fibrillation Rate controlled with metoprolol Continue Xarelto  CAD s/p CABG 1998-s/p PCI 2008 No anginal symptoms  Mild aortic stenosis Supportive care  CKD stage IIIa At baseline Follow electrolytes periodically  Acute urinary retention BPH Had acute urinary retention on admission-Foley catheter placed Continue Flomax-now on finasteride Remove Foley-attempt voiding trial today.  HLD Continue Crestor  VTE Continue Xarelto  History of subdural hematoma following a fall 2018 History of CVA Supportive care PT/OT eval  Normocytic anemia Likely due to chronic disease Slight drop in hemoglobin overnight likely due to hemodilution No evidence of GI bleeding/blood loss Follow CBC  Thrombocytopenia Mild-appears to be a chronic issue Follow CBC  BPH Continue Flomax  GERD PPI  Mood disorder/depression Continue Prozac  Age indeterminate L1 compression fracture Incidental finding on CT abdomen/pelvis Supportive care  Debility/deconditioning PT/OT eval Home health recommended  Pressure Ulcer: Pressure Injury 03/12/22 Coccyx Mid Stage 1 -  Intact skin with non-blanchable redness of  a localized area usually over a bony prominence. (Active)  03/12/22 1800  Location: Coccyx  Location Orientation: Mid  Staging: Stage 1 -  Intact skin with  non-blanchable redness of a localized area usually over a bony prominence.  Wound Description (Comments):   Present on Admission: Yes  Dressing Type Gauze (Comment) 03/13/22 2100    BMI: Estimated body mass index is 29.84 kg/m as calculated from the following:   Height as of 02/15/22: 5\' 3"  (1.6 m).   Weight as of this encounter: 76.4 kg.   Code status:   Code Status: Full Code   DVT Prophylaxis: Rivaroxaban (XARELTO) tablet 15 mg     Family Communication:  Son at bedside   Disposition Plan: Status is: Inpatient Remains inpatient appropriate because: Possible PNA-not yet stable for discharge-see above documentation.   Planned Discharge Destination:Home health   Diet: Diet Order             Diet Heart Room service appropriate? Yes; Fluid consistency: Thin  Diet effective now                     Antimicrobial agents: Anti-infectives (From admission, onward)    Start     Dose/Rate Route Frequency Ordered Stop   03/12/22 2000  cefTRIAXone (ROCEPHIN) 1 g in sodium chloride 0.9 % 100 mL IVPB        1 g 200 mL/hr over 30 Minutes Intravenous Every 24 hours 03/12/22 1521     03/12/22 1600  azithromycin (ZITHROMAX) 500 mg in sodium chloride 0.9 % 250 mL IVPB        500 mg 250 mL/hr over 60 Minutes Intravenous Every 24 hours 03/12/22 1521     03/12/22 1145  ceFEPIme (MAXIPIME) 2 g in sodium chloride 0.9 % 100 mL IVPB        2 g 200 mL/hr over 30 Minutes Intravenous  Once 03/12/22 1137 03/12/22 1254   03/12/22 1145  metroNIDAZOLE (FLAGYL) IVPB 500 mg        500 mg 100 mL/hr over 60 Minutes Intravenous  Once 03/12/22 1137 03/12/22 1407   03/12/22 1145  vancomycin (VANCOCIN) IVPB 1000 mg/200 mL premix  Status:  Discontinued        1,000 mg 200 mL/hr over 60 Minutes Intravenous  Once 03/12/22 1137 03/12/22 1144   03/12/22 1145  vancomycin (VANCOREADY) IVPB 1500 mg/300 mL        1,500 mg 150 mL/hr over 120 Minutes Intravenous  Once 03/12/22 1144 03/12/22 1632         MEDICATIONS: Scheduled Meds:  Chlorhexidine Gluconate Cloth  6 each Topical Daily   finasteride  5 mg Oral Daily   FLUoxetine  20 mg Oral Daily   furosemide  20 mg Oral q AM   metoprolol tartrate  12.5 mg Oral Daily   pantoprazole  40 mg Oral Daily   Rivaroxaban  15 mg Oral Q supper   rosuvastatin  5 mg Oral Daily   sodium chloride flush  3 mL Intravenous Q12H   tamsulosin  0.4 mg Oral Daily   Continuous Infusions:  azithromycin 500 mg (03/13/22 1806)   cefTRIAXone (ROCEPHIN)  IV 1 g (03/13/22 2017)   PRN Meds:.acetaminophen **OR** acetaminophen, calamine, mouth rinse, polyethylene glycol   I have personally reviewed following labs and imaging studies  LABORATORY DATA: CBC: Recent Labs  Lab 03/12/22 1148 03/12/22 1203 03/13/22 0031 03/14/22 0029  WBC 11.2*  --  6.7 7.8  NEUTROABS 9.7*  --   --   --  HGB 10.5* 11.2*  11.2* 8.6* 8.4*  HCT 32.8* 33.0*  33.0* 25.9* 25.4*  MCV 99.7  --  98.5 97.7  PLT 118*  --  85* 88*     Basic Metabolic Panel: Recent Labs  Lab 03/12/22 1148 03/12/22 1203 03/13/22 0031 03/14/22 0029  NA 135 135  135 135 132*  K 4.3 4.2  4.2 3.8 3.5  CL 104 103 107 103  CO2 18*  --  20* 18*  GLUCOSE 115* 113* 95 109*  BUN 18 18 18 23   CREATININE 1.53* 1.40* 1.30* 1.41*  CALCIUM 9.0  --  8.2* 8.0*  MG 1.9  --   --   --      GFR: Estimated Creatinine Clearance: 30.6 mL/min (A) (by C-G formula based on SCr of 1.41 mg/dL (H)).  Liver Function Tests: Recent Labs  Lab 03/12/22 1148 03/13/22 0031  AST 23 15  ALT 8 8  ALKPHOS 94 63  BILITOT 1.6* 0.9  PROT 7.2 5.3*  ALBUMIN 3.6 2.5*    No results for input(s): "LIPASE", "AMYLASE" in the last 168 hours. No results for input(s): "AMMONIA" in the last 168 hours.  Coagulation Profile: Recent Labs  Lab 03/12/22 1148 03/13/22 0031  INR 2.4* 2.2*     Cardiac Enzymes: No results for input(s): "CKTOTAL", "CKMB", "CKMBINDEX", "TROPONINI" in the last 168 hours.  BNP (last 3  results) Recent Labs    06/25/21 0911  PROBNP 3,088*     Lipid Profile: No results for input(s): "CHOL", "HDL", "LDLCALC", "TRIG", "CHOLHDL", "LDLDIRECT" in the last 72 hours.  Thyroid Function Tests: No results for input(s): "TSH", "T4TOTAL", "FREET4", "T3FREE", "THYROIDAB" in the last 72 hours.  Anemia Panel: No results for input(s): "VITAMINB12", "FOLATE", "FERRITIN", "TIBC", "IRON", "RETICCTPCT" in the last 72 hours.  Urine analysis:    Component Value Date/Time   COLORURINE YELLOW 03/12/2022 1259   APPEARANCEUR HAZY (A) 03/12/2022 1259   LABSPEC 1.013 03/12/2022 1259   PHURINE 7.0 03/12/2022 1259   GLUCOSEU NEGATIVE 03/12/2022 1259   HGBUR SMALL (A) 03/12/2022 1259   BILIRUBINUR NEGATIVE 03/12/2022 1259   KETONESUR 5 (A) 03/12/2022 1259   PROTEINUR NEGATIVE 03/12/2022 1259   UROBILINOGEN 0.2 07/13/2014 1804   NITRITE NEGATIVE 03/12/2022 1259   LEUKOCYTESUR NEGATIVE 03/12/2022 1259    Sepsis Labs: Lactic Acid, Venous    Component Value Date/Time   LATICACIDVEN 1.8 03/12/2022 1443    MICROBIOLOGY: Recent Results (from the past 240 hour(s))  Resp panel by RT-PCR (RSV, Flu A&B, Covid) Anterior Nasal Swab     Status: None   Collection Time: 03/12/22 11:36 AM   Specimen: Anterior Nasal Swab  Result Value Ref Range Status   SARS Coronavirus 2 by RT PCR NEGATIVE NEGATIVE Final    Comment: (NOTE) SARS-CoV-2 target nucleic acids are NOT DETECTED.  The SARS-CoV-2 RNA is generally detectable in upper respiratory specimens during the acute phase of infection. The lowest concentration of SARS-CoV-2 viral copies this assay can detect is 138 copies/mL. A negative result does not preclude SARS-Cov-2 infection and should not be used as the sole basis for treatment or other patient management decisions. A negative result may occur with  improper specimen collection/handling, submission of specimen other than nasopharyngeal swab, presence of viral mutation(s) within  the areas targeted by this assay, and inadequate number of viral copies(<138 copies/mL). A negative result must be combined with clinical observations, patient history, and epidemiological information. The expected result is Negative.  Fact Sheet for Patients:  EntrepreneurPulse.com.au  Fact  Sheet for Healthcare Providers:  IncredibleEmployment.be  This test is no t yet approved or cleared by the [place] FDA and  has been authorized for detection and/or diagnosis of SARS-CoV-2 by FDA under an Emergency Use Authorization (EUA). This EUA will remain  in effect (meaning this test can be used) for the duration of the COVID-19 declaration under Section 564(b)(1) of the Act, 21 U.S.C.section 360bbb-3(b)(1), unless the authorization is terminated  or revoked sooner.       Influenza A by PCR NEGATIVE NEGATIVE Final   Influenza B by PCR NEGATIVE NEGATIVE Final    Comment: (NOTE) The Xpert Xpress SARS-CoV-2/FLU/RSV plus assay is intended as an aid in the diagnosis of influenza from Nasopharyngeal swab specimens and should not be used as a sole basis for treatment. Nasal washings and aspirates are unacceptable for Xpert Xpress SARS-CoV-2/FLU/RSV testing.  Fact Sheet for Patients: EntrepreneurPulse.com.au  Fact Sheet for Healthcare Providers: IncredibleEmployment.be  This test is not yet approved or cleared by the [place] FDA and has been authorized for detection and/or diagnosis of SARS-CoV-2 by FDA under an Emergency Use Authorization (EUA). This EUA will remain in effect (meaning this test can be used) for the duration of the COVID-19 declaration under Section 564(b)(1) of the Act, 21 U.S.C. section 360bbb-3(b)(1), unless the authorization is terminated or revoked.     Resp Syncytial Virus by PCR NEGATIVE NEGATIVE Final    Comment: (NOTE) Fact Sheet for  Patients: EntrepreneurPulse.com.au  Fact Sheet for Healthcare Providers: IncredibleEmployment.be  This test is not yet approved or cleared by the [place] FDA and has been authorized for detection and/or diagnosis of SARS-CoV-2 by FDA under an Emergency Use Authorization (EUA). This EUA will remain in effect (meaning this test can be used) for the duration of the COVID-19 declaration under Section 564(b)(1) of the Act, 21 U.S.C. section 360bbb-3(b)(1), unless the authorization is terminated or revoked.  Performed at [facility], [place] [address], [place], [place] [address]   Blood Culture (routine x 2)     Status: None (Preliminary result)   Collection Time: 03/12/22 11:41 AM   Specimen: BLOOD  Result Value Ref Range Status   Specimen Description BLOOD LEFT ANTECUBITAL  Final   Special Requests   Final    BOTTLES DRAWN AEROBIC AND ANAEROBIC Blood Culture results may not be optimal due to an inadequate volume of blood received in culture bottles   Culture   Final    NO GROWTH 2 DAYS Performed at [facility], [place] [address] [place], [place] [address]    Report Status PENDING  Incomplete  Blood Culture (routine x 2)     Status: None (Preliminary result)   Collection Time: 03/12/22 11:48 AM   Specimen: BLOOD LEFT HAND  Result Value Ref Range Status   Specimen Description BLOOD LEFT HAND  Final   Special Requests   Final    BOTTLES DRAWN AEROBIC AND ANAEROBIC Blood Culture adequate volume   Culture   Final    NO GROWTH 2 DAYS Performed at [facility], [place] [address], [place], [place] [address]    Report Status PENDING  Incomplete  Urine Culture     Status: Abnormal   Collection Time: 03/12/22  2:03 PM   Specimen: In/Out Cath Urine  Result Value Ref Range Status   Specimen Description IN/OUT CATH URINE  Final   Special Requests   Final    NONE Performed at [facility], [place]  [address] [place], [place]  [address]    Culture MULTIPLE SPECIES PRESENT, SUGGEST RECOLLECTION (A)  Final   Report Status 03/13/2022 FINAL  Final    RADIOLOGY STUDIES/RESULTS: CT CHEST ABDOMEN PELVIS WO CONTRAST  Result Date: 03/12/2022 CLINICAL DATA:  Generalized weakness and dysuria, suspicion for sepsis EXAM: CT CHEST, ABDOMEN AND PELVIS WITHOUT CONTRAST TECHNIQUE: Multidetector CT imaging of the chest, abdomen and pelvis was performed following the standard protocol without IV contrast. RADIATION DOSE REDUCTION: This exam was performed according to the departmental dose-optimization program which includes automated exposure control, adjustment of the mA and/or kV according to patient size and/or use of iterative reconstruction technique. COMPARISON:  CT chest dated 02/03/2020, CT abdomen and pelvis dated 07/13/2014 FINDINGS: CT CHEST FINDINGS Technically challenging examination for evaluation of detailed findings due to patient respiratory motion. Cardiovascular: Multichamber cardiomegaly. No significant pericardial fluid/thickening. Coronary artery calcifications. Great vessels are normal in course and caliber. Mediastinum/Nodes: Thyroid gland without nodules meeting criteria for imaging follow-up by size. Normal esophagus. No pathologically enlarged axillary, supraclavicular, mediastinal, or hilar lymph nodes. Lungs/Pleura: The central airways are patent. No focal consolidation. Similar lower lung predominant subpleural reticulations. No pneumothorax. Similar trace pleural effusion along the medial right lower lobe and adjacent opacity, likely reflecting to prior treatment related changes. Musculoskeletal: No acute or abnormal lytic or blastic osseous lesions. CT ABDOMEN PELVIS FINDINGS Hepatobiliary: No focal hepatic lesions. No intra or extrahepatic biliary ductal dilation. Cholecystectomy. Pancreas: No focal lesions or main ductal dilation. Spleen: Normal in size without focal abnormality. Adrenals/Urinary Tract: No adrenal  nodules. Duplex right kidney. No suspicious renal mass, calculi, or hydronephrosis. Distended urinary bladder. Stomach/Bowel: Normal appearance of the stomach. No evidence of bowel wall thickening, distention, or inflammatory changes. The appendix is not discretely seen. colonic diverticulosis without acute diverticulitis. Vascular/Lymphatic: Aortic atherosclerosis. No enlarged abdominal or pelvic lymph nodes. Reproductive: Prostate is unremarkable. Other: No free fluid, fluid collection, or free air. Musculoskeletal: Median sternotomy wires are nondisplaced. No acute or abnormal lytic or blastic osseous findings. Age indeterminate L1 compression deformity is new since 07/13/2014. Unchanged grade 1 anterolisthesis at L4-5. IMPRESSION: 1. Technically challenging examination for evaluation of detailed findings due to patient respiratory motion. No acute abnormality in the chest, abdomen, or pelvis. 2. Distended urinary bladder. Consider correlation with bladder outlet obstruction. 3. Age indeterminate L1 compression deformity is new since 07/13/2014. Correlate with point tenderness. 4. Aortic Atherosclerosis (ICD10-I70.0). Electronically Signed   By: Darrin Nipper M.D.   On: 03/12/2022 12:59   CT Head Wo Contrast  Result Date: 03/12/2022 CLINICAL DATA:  Altered mental status EXAM: CT HEAD WITHOUT CONTRAST TECHNIQUE: Contiguous axial images were obtained from the base of the skull through the vertex without intravenous contrast. RADIATION DOSE REDUCTION: This exam was performed according to the departmental dose-optimization program which includes automated exposure control, adjustment of the mA and/or kV according to patient size and/or use of iterative reconstruction technique. COMPARISON:  CT Head 03/09/19 FINDINGS: Brain: No evidence of acute infarction, hemorrhage, hydrocephalus, extra-axial collection or mass lesion/mass effect. Redemonstrated chronic small-vessel infarcts in the right corona radiata, left  thalamus, and right cerebellum. Sequela of mild chronic microvascular ischemic change. Unchanged prominence of the subarachnoid space on the bilateral frontal lobes. Vascular: No hyperdense vessel or unexpected calcification. Skull: Normal. Negative for fracture or focal lesion. Sinuses/Orbits: No acute finding. Bilateral lens replacement. Air-fluid level in the left maxillary sinus with osseous findings suggestive of chronic underlying sinusitis. Other: None. IMPRESSION: 1. No acute intracranial abnormality. 2. Air-fluid  level in the left maxillary sinus with osseous findings suggestive of chronic underlying sinusitis. Electronically Signed   By: Marin Roberts M.D.   On: 03/12/2022 12:49   DG Chest Port 1 View  Result Date: 03/12/2022 CLINICAL DATA:  Possible sepsis EXAM: PORTABLE CHEST 1 VIEW COMPARISON:  CXR 02/15/22 FINDINGS: Status post median sternotomy. No pleural effusion. No pneumothorax. Unchanged cardiac and mediastinal contours. Compared to prior exam there is a new hazy opacity at the right costophrenic angle. Degenerative changes of the right AC joint. Displaced rib fractures. Visualized upper abdomen is radiographically unremarkable. IMPRESSION: Hazy opacity at the right costophrenic angle could represent atelectasis or infection. Electronically Signed   By: Marin Roberts M.D.   On: 03/12/2022 12:09     LOS: 2 days   Oren Binet, MD  [facility]    To contact the attending provider between 7A-7P or the covering provider during after hours 7P-7A, please log into the web site [email or web address] and access using universal [place] password for that web site. If you do not have the password, please call the hospital operator.  03/14/2022, 10:51 AM

## 2022-03-14 NOTE — Consult Note (Signed)
[facility] Nurse Consult Note: Reason for Consult: sacral wound Wound type: Stage 2 Pressure Injury Pressure Injury POA: Yes; documented day of admission Measurement: see nursing flow sheets  Wound UJW:[id number] Drainage (amount, consistency, odor) none Periwound: intact  Dressing procedure/placement/frequency: Conservative sharp wound debridement (CSWD xeroform and foam dressing Low air loss mattress for moisture management and pressure redistribution Chair pressure redistribution pad for use when up in chair and to be taken with patient for use at home    Re consult if needed, will not follow at this time. Thanks  Belanna Manring [employer], RN,CWOCN, CNS, [place] [phone])

## 2022-03-15 ENCOUNTER — Other Ambulatory Visit (HOSPITAL_COMMUNITY): Payer: Self-pay

## 2022-03-15 DIAGNOSIS — I2699 Other pulmonary embolism without acute cor pulmonale: Secondary | ICD-10-CM

## 2022-03-15 MED ORDER — FINASTERIDE 5 MG PO TABS
5.0000 mg | ORAL_TABLET | Freq: Every day | ORAL | 0 refills | Status: DC
  Filled 2022-03-15: qty 30, 30d supply, fill #0

## 2022-03-15 MED ORDER — CEFADROXIL 500 MG PO CAPS
500.0000 mg | ORAL_CAPSULE | Freq: Two times a day (BID) | ORAL | 0 refills | Status: AC
  Filled 2022-03-15: qty 4, 2d supply, fill #0

## 2022-03-15 NOTE — TOC Progression Note (Signed)
Transition of Care (TOC) - Progression Note   Kelly with [facility] aware discharge today.   Per Erasmo Downer with [facility] ,left UE platform attachment for walker was delivered to room yesterday. Secure chatted nurse  Patient Details  Name: Evan [name] MRN: [id number] Date of Birth: 1927-07-20  [facility]) CM/SW Contact  Danisha Brassfield, Edson Snowball, RN Phone Number: 03/15/2022, 12:28 PM  Clinical Narrative:       Expected Discharge Plan: [place] Barriers to Discharge: Continued Medical Work up  Expected Discharge Plan and Services Expected Discharge Plan: [place]   Discharge Planning Services: CM Consult Post Acute Care Choice: [place] arrangements for the past 2 months: Single Family Home Expected Discharge Date: 03/15/22               DME Arranged: Other see comment (left UE platform attachment for walker) DME Agency: [employer] Date DME Agency Contacted: 03/14/22 Time DME Agency Contacted: [id number] Representative spoke with at DME Agency: Erasmo Downer HH Arranged: OT, PT, RN [facility] Agency: [place] Date [place]: 03/14/22 Time [place]: 9 Representative spoke with at [place]: [place] Determinants of Health ([place]) Interventions    Readmission Risk Interventions     No data to display

## 2022-03-15 NOTE — Discharge Summary (Signed)
PATIENT DETAILS Name: Evan [name] Age: 88 y.o. Sex: male Date of Birth: [birth year] MRN: [id number]. Admitting Physician: Marcelyn Bruins, MD PVV:[id number], Cammie Mcgee, MD  Admit Date: 03/12/2022 Discharge date: 03/15/2022  Recommendations for Outpatient Follow-up:  Follow up with PCP in 1-2 weeks Please obtain CMP/CBC in one week Please ensure follow-up with primary urologist  Admitted From:  Home  Disposition: Home health   Discharge Condition: fair  CODE STATUS:   Code Status: Full Code   Diet recommendation:  Diet Order             Diet - low sodium heart healthy           Diet Heart Room service appropriate? Yes; Fluid consistency: Thin  Diet effective now                    Brief Summary: Patient is a 88 y.o.  male with history of CKD stage IIIa, prior CVA, CAD s/p CABG, PE on anticoagulation, A-fib, HLD, BPH, essential tremor-who presented with subjective fever, chills-cough-thought to have PNA and subsequently admitted to the hospitalist service.   Significant events: 12/09>> admit to [facility]-S[facility] likely due to PNA.   Significant studies: 01/10>> echo: EF 45-50% 12/09>> CXR: Hazy opacity right costophrenic angle. 12/09>> CT head: No acute intracranial malady 12/09>> CT chest/abdomen/pelvis: Poor study-no acute abnormality in chest/abdomen/pelvis.  Distended urinary bladder.  Age-indeterminate L1 compression fracture.   Significant microbiology data: 12/09>> COVID/influenza/RSV: Negative 12/09>> urine culture: Multiple species 12/09>> blood culture: No growth   Procedures: None   Consults: None  Brief Hospital Course: SIRS Probable PNA Afebrile-nontoxic-appearing  Significantly better compared to how he first presented with empiric antibiotics-thought to have pneumonia although radiological studies not definitive. Since improved with empiric antibiotics-we will transition to oral antimicrobial therapy for a few more days. Cultures remain  negative-stable for discharge today.   Chronic combined HFpEF/HFrEF Euvolemic By cardiology note-did not tolerate Entresto Continue Lasix/beta-blocker   Chronic atrial fibrillation Rate controlled with metoprolol Continue Xarelto   CAD s/p CABG 1998-s/p PCI 2008 No anginal symptoms   Mild aortic stenosis Supportive care   CKD stage IIIa At baseline Follow electrolytes periodically   Acute urinary retention BPH Had acute urinary retention on admission-Foley catheter placed Continue Flomax-now on finasteride Foley catheter removed 12/11-voiding spontaneously Please ensure follow-up with urology post discharge.   HLD Continue Crestor   VTE Continue Xarelto   History of subdural hematoma following a fall 2018 History of CVA Supportive care PT/OT eval completed-Home health recommended.   Normocytic anemia Likely due to chronic disease Slight drop in hemoglobin overnight likely due to hemodilution No evidence of GI bleeding/blood loss Follow CBC as an outpatient   Thrombocytopenia Mild-appears to be a chronic issue Follow CBC as an outpatient    GERD PPI   Mood disorder/depression Continue Prozac   Age indeterminate L1 compression fracture Incidental finding on CT abdomen/pelvis Supportive care   Debility/deconditioning PT/OT eval Home health recommended  Pressure Ulcer: Pressure Injury 03/12/22 Coccyx Mid Stage 1 -  Intact skin with non-blanchable redness of a localized area usually over a bony prominence. (Active)  03/12/22 1800  Location: Coccyx  Location Orientation: Mid  Staging: Stage 1 -  Intact skin with non-blanchable redness of a localized area usually over a bony prominence.  Wound Description (Comments):   Present on Admission: Yes  Dressing Type Gauze (Comment) 03/15/22 0105    Obesity: Estimated body mass index is 29.48 kg/m as calculated from the  following:   Height as of 02/15/22: 5\' 3"  (1.6 m).   Weight as of this encounter:  75.5 kg.    Discharge Diagnoses:  Principal Problem:   Pneumonia Active Problems:   UNSPECIFIED PERIPHERAL VASCULAR DISEASE   GERD (gastroesophageal reflux disease)   Hyperlipidemia LDL goal <70   Essential hypertension   S/P CABG (coronary artery bypass graft)   BPH (benign prostatic hyperplasia)   Coronary artery disease involving native coronary artery of native heart without angina pectoris   GAD (generalized anxiety disorder)   Permanent atrial fibrillation (HCC)   History of pericarditis   HFmrEF (heart failure with mildly reduced ejection fraction) (HCC)   Pulmonary embolism (HCC)   History of lung cancer   Depression   Stage 3a chronic kidney disease (HCC)   COPD (chronic obstructive pulmonary disease) ([place])   Discharge Instructions:  Activity:  As tolerated with Full fall precautions use walker/cane & assistance as needed  Discharge Instructions     Call MD for:  difficulty breathing, headache or visual disturbances   Complete by: As directed    Call MD for:  persistant dizziness or light-headedness   Complete by: As directed    Call MD for:  redness, tenderness, or signs of infection (pain, swelling, redness, odor or green/yellow discharge around incision site)   Complete by: As directed    Diet - low sodium heart healthy   Complete by: As directed    Discharge instructions   Complete by: As directed    Follow with Primary MD  [name], MD in 1-2 weeks  Please follow-up with your primary urologist at [facility] urology in the next 1-2 weeks.  Please get a complete blood count and chemistry panel checked by your Primary MD at your next visit, and again as instructed by your Primary MD.  Get Medicines reviewed and adjusted: Please take all your medications with you for your next visit with your Primary MD  Laboratory/radiological data: Please request your Primary MD to go over all hospital tests and procedure/radiological results at the follow up,  please ask your Primary MD to get all Hospital records sent to his/her office.  In some cases, they will be blood work, cultures and biopsy results pending at the time of your discharge. Please request that your primary care M.D. follows up on these results.  Also Note the following: If you experience worsening of your admission symptoms, develop shortness of breath, life threatening emergency, suicidal or homicidal thoughts you must seek medical attention immediately by calling 911 or calling your MD immediately  if symptoms less severe.  You must read complete instructions/literature along with all the possible adverse reactions/side effects for all the Medicines you take and that have been prescribed to you. Take any new Medicines after you have completely understood and accpet all the possible adverse reactions/side effects.   Do not drive when taking Pain medications or sleeping medications (Benzodaizepines)  Do not take more than prescribed Pain, Sleep and Anxiety Medications. It is not advisable to combine anxiety,sleep and pain medications without talking with your primary care practitioner  Special Instructions: If you have smoked or chewed Tobacco  in the last 2 yrs please stop smoking, stop any regular Alcohol  and or any Recreational drug use.  Wear Seat belts while driving.  Please note: You were cared for by a hospitalist during your hospital stay. Once you are discharged, your primary care physician will handle any further medical issues. Please note that NO  REFILLS for any discharge medications will be authorized once you are discharged, as it is imperative that you return to your primary care physician (or establish a relationship with a primary care physician if you do not have one) for your post hospital discharge needs so that they can reassess your need for medications and monitor your lab values.   Discharge wound care:   Complete by: As directed    Top wound with single  layer of xeroform gauze, cover with silicone foam dressings to the sacral wound change every 3 days. Assess under dressings each shift for any acute changes in the wounds.   Increase activity slowly   Complete by: As directed       Allergies as of 03/15/2022       Reactions   Feldene [piroxicam] Rash   Blistering rash   Neomycin-polymyxin-hc Itching   Statins Other (See Comments)   Myalgias   Valium [diazepam] Other (See Comments)   Dizziness    Latex Rash   Tequin [gatifloxacin] Anxiety   Vibra-tab [doxycycline] Nausea Only        Medication List     STOP taking these medications    amoxicillin-clavulanate 875-125 MG tablet Commonly known as: AUGMENTIN       TAKE these medications    acetaminophen 500 MG tablet Commonly known as: TYLENOL Take 500 mg by mouth every 6 (six) hours as needed for mild pain, moderate pain, fever or headache.   cefadroxil 500 MG capsule Commonly known as: DURICEF Take 1 capsule (500 mg total) by mouth 2 (two) times daily for 2 days.   finasteride 5 MG tablet Commonly known as: PROSCAR Take 1 tablet (5 mg total) by mouth daily.   FLUoxetine 20 MG capsule Commonly known as: PROZAC TAKE 1 CAPSULE EVERY DAY What changed: when to take this   furosemide 20 MG tablet Commonly known as: LASIX Take 1 tablet (20 mg total) by mouth daily. What changed: when to take this   metoprolol tartrate 25 MG tablet Commonly known as: LOPRESSOR TAKE 1/2 TABLET TWICE DAILY What changed:  how much to take when to take this   nitroGLYCERIN 0.4 MG SL tablet Commonly known as: NITROSTAT Place 1 tablet (0.4 mg total) under the tongue every 5 (five) minutes as needed for chest pain.   pantoprazole 40 MG tablet Commonly known as: PROTONIX TAKE 1 TABLET EVERY DAY What changed:  how much to take how to take this when to take this additional instructions   polyethylene glycol 17 g packet Commonly known as: MIRALAX / GLYCOLAX Take 17 g by mouth  daily as needed for mild constipation.   rosuvastatin 5 MG tablet Commonly known as: CRESTOR TAKE 1 TABLET EVERY DAY What changed: when to take this   tacrolimus 0.1 % ointment Commonly known as: PROTOPIC Apply 1 application topically 2 (two) times daily.   tamsulosin 0.4 MG Caps capsule Commonly known as: FLOMAX TAKE 1 CAPSULE EVERY DAY What changed: when to take this   VITAMIN D-3 PO Take 1 capsule by mouth in the morning.   Xarelto 15 MG Tabs tablet Generic drug: Rivaroxaban TAKE 1 TABLET EVERY DAY WITH SUPPER What changed: See the new instructions.   Xiidra 5 % Soln Generic drug: Lifitegrast Place 1 drop into both eyes 2 (two) times daily. What changed:  when to take this reasons to take this   [place]inity 1 application  topically 4 (four) times daily as needed (skin barrier).  Durable Medical Equipment  (From admission, onward)           Start     Ordered   03/14/22 0750  For home use only DME Other see comment  Once       Comments: L UE platform attachment for RW  Question:  Length of Need  Answer:  Lifetime   03/14/22 0749              Discharge Care Instructions  (From admission, onward)           Start     Ordered   03/15/22 0000  Discharge wound care:       Comments: Top wound with single layer of xeroform gauze, cover with silicone foam dressings to the sacral wound change every 3 days. Assess under dressings each shift for any acute changes in the wounds.   03/15/22 0841            Follow-up Information     [facility] Follow [facility].   Specialty: [facility] Contact information: [address] [place] [address] [phone]         [name], MD. Schedule an appointment as soon as possible for a visit in 1 week(s).   Specialty: Family Medicine Contact information: [address] [place] [address] [place]. Schedule an appointment as soon as possible for a visit in 1 week(s).   Contact information: [place] [address] [phone]               Allergies  Allergen Reactions   Feldene [Piroxicam] Rash    Blistering rash   Neomycin-Polymyxin-Hc Itching   Statins Other (See Comments)    Myalgias   Valium [Diazepam] Other (See Comments)    Dizziness    Latex Rash   Tequin [Gatifloxacin] Anxiety   Vibra-Tab [Doxycycline] Nausea Only     Other Procedures/Studies: CT CHEST ABDOMEN PELVIS WO CONTRAST  Result Date: 03/12/2022 CLINICAL DATA:  Generalized weakness and dysuria, suspicion for sepsis EXAM: CT CHEST, ABDOMEN AND PELVIS WITHOUT CONTRAST TECHNIQUE: Multidetector CT imaging of the chest, abdomen and pelvis was performed following the standard protocol without IV contrast. RADIATION DOSE REDUCTION: This exam was performed according to the departmental dose-optimization program which includes automated exposure control, adjustment of the mA and/or kV according to patient size and/or use of iterative reconstruction technique. COMPARISON:  CT chest dated 02/03/2020, CT abdomen and pelvis dated 07/13/2014 FINDINGS: CT CHEST FINDINGS Technically challenging examination for evaluation of detailed findings due to patient respiratory motion. Cardiovascular: Multichamber cardiomegaly. No significant pericardial fluid/thickening. Coronary artery calcifications. Great vessels are normal in course and caliber. Mediastinum/Nodes: Thyroid gland without nodules meeting criteria for imaging follow-up by size. Normal esophagus. No pathologically enlarged axillary, supraclavicular, mediastinal, or hilar lymph nodes. Lungs/Pleura: The central airways are patent. No focal consolidation. Similar lower lung predominant subpleural reticulations. No pneumothorax. Similar trace pleural effusion along the medial right lower lobe and adjacent opacity, likely reflecting to  prior treatment related changes. Musculoskeletal: No acute or abnormal lytic or blastic osseous lesions. CT ABDOMEN PELVIS FINDINGS Hepatobiliary: No focal hepatic lesions. No intra or extrahepatic biliary ductal dilation. Cholecystectomy. Pancreas: No focal lesions or main ductal dilation. Spleen: Normal in size without focal abnormality. Adrenals/Urinary Tract: No adrenal nodules. Duplex right kidney. No suspicious renal mass, calculi, or hydronephrosis. Distended urinary bladder.  Stomach/Bowel: Normal appearance of the stomach. No evidence of bowel wall thickening, distention, or inflammatory changes. The appendix is not discretely seen. colonic diverticulosis without acute diverticulitis. Vascular/Lymphatic: Aortic atherosclerosis. No enlarged abdominal or pelvic lymph nodes. Reproductive: Prostate is unremarkable. Other: No free fluid, fluid collection, or free air. Musculoskeletal: Median sternotomy wires are nondisplaced. No acute or abnormal lytic or blastic osseous findings. Age indeterminate L1 compression deformity is new since 07/13/2014. Unchanged grade 1 anterolisthesis at L4-5. IMPRESSION: 1. Technically challenging examination for evaluation of detailed findings due to patient respiratory motion. No acute abnormality in the chest, abdomen, or pelvis. 2. Distended urinary bladder. Consider correlation with bladder outlet obstruction. 3. Age indeterminate L1 compression deformity is new since 07/13/2014. Correlate with point tenderness. 4. Aortic Atherosclerosis (ICD10-I70.0). Electronically Signed   By: Darrin Nipper M.D.   On: 03/12/2022 12:59   CT Head Wo Contrast  Result Date: 03/12/2022 CLINICAL DATA:  Altered mental status EXAM: CT HEAD WITHOUT CONTRAST TECHNIQUE: Contiguous axial images were obtained from the base of the skull through the vertex without intravenous contrast. RADIATION DOSE REDUCTION: This exam was performed according to the departmental dose-optimization program which includes  automated exposure control, adjustment of the mA and/or kV according to patient size and/or use of iterative reconstruction technique. COMPARISON:  CT Head 03/09/19 FINDINGS: Brain: No evidence of acute infarction, hemorrhage, hydrocephalus, extra-axial collection or mass lesion/mass effect. Redemonstrated chronic small-vessel infarcts in the right corona radiata, left thalamus, and right cerebellum. Sequela of mild chronic microvascular ischemic change. Unchanged prominence of the subarachnoid space on the bilateral frontal lobes. Vascular: No hyperdense vessel or unexpected calcification. Skull: Normal. Negative for fracture or focal lesion. Sinuses/Orbits: No acute finding. Bilateral lens replacement. Air-fluid level in the left maxillary sinus with osseous findings suggestive of chronic underlying sinusitis. Other: None. IMPRESSION: 1. No acute intracranial abnormality. 2. Air-fluid level in the left maxillary sinus with osseous findings suggestive of chronic underlying sinusitis. Electronically Signed   By: Marin Roberts M.D.   On: 03/12/2022 12:49   DG Chest Port 1 View  Result Date: 03/12/2022 CLINICAL DATA:  Possible sepsis EXAM: PORTABLE CHEST 1 VIEW COMPARISON:  CXR 02/15/22 FINDINGS: Status post median sternotomy. No pleural effusion. No pneumothorax. Unchanged cardiac and mediastinal contours. Compared to prior exam there is a new hazy opacity at the right costophrenic angle. Degenerative changes of the right AC joint. Displaced rib fractures. Visualized upper abdomen is radiographically unremarkable. IMPRESSION: Hazy opacity at the right costophrenic angle could represent atelectasis or infection. Electronically Signed   By: Marin Roberts M.D.   On: 03/12/2022 12:09   DG SWALLOW FUNC OP MEDICARE SPEECH PATH  Result Date: 02/17/2022 Table formatting from the original result was not included. Objective Swallowing Evaluation: Type of Study: MBS-Modified Barium Swallow Study  Patient Details Name:  RAND ETCHISON MRN: [id number] Date of Birth: [birth year] Today's Date: 02/17/2022 Time: SLP Start Time (ACUTE ONLY): 1140 -SLP Stop Time (ACUTE ONLY): 1213 SLP Time Calculation (min) (ACUTE ONLY): 33 min Past Medical History: Past Medical History: Diagnosis Date  Adenocarcinoma of right lung ([place]) 01/06/2016  Anginal chest pain at rest   Chronic non, controlled on Lorazepam  Anxiety   Arthritis   "all over"   BPH (benign prostatic hyperplasia)   CAD (coronary artery disease)   Chronic lower back pain   Complication of anesthesia   "problems making water afterwards"  Depression   DJD (degenerative joint disease)   Esophageal ulcer without bleeding   bid ppi indefinitely  Family history of adverse reaction to anesthesia   "children w/PONV"  GERD (gastroesophageal reflux disease)   Headache   "couple/week maybe" (09/04/2015)  Hemochromatosis   Possible, elevated iron stores, hook-like osteophytes on hand films, normal LFTs  Hepatitis   "yellow jaundice as a baby"  History of ASCVD   MULTIVESSEL  Hyperlipidemia   Hypertension   Mild aortic stenosis 06/23/2021  Osteoarthritis   Pneumonia   "several times; got a little now" (09/04/2015)  Rheumatoid arthritis ([place])   Subdural hematoma ([place])   small after fall 09/2016-plavix held, neurosurgery consulted.  Asymptomatic.    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[place]) 02/2018  on Lovenox lifelong since he failed PO Eliquis Past Surgical History: Past Surgical History: Procedure Laterality Date  ARM SKIN LESION BIOPSY / EXCISION Left 09/02/2015  CATARACT EXTRACTION W/ INTRAOCULAR LENS  IMPLANT, BILATERAL Bilateral   CHOLECYSTECTOMY OPEN    CORNEAL TRANSPLANT Bilateral   "one at [facility]; one at [facility]"  [place] W/ STENT    DE stent ostium into the right radial free graft at [facility]-, 02-2007  ESOPHAGOGASTRODUODENOSCOPY N/A 11/09/2014  Procedure: ESOPHAGOGASTRODUODENOSCOPY (EGD);  Surgeon: [name], MD;  Location: [facility]  ENDOSCOPY;  Service: Endoscopy;  Laterality: N/A;  INGUINAL HERNIA REPAIR    JOINT REPLACEMENT    LEFT HEART CATH AND CORS/GRAFTS ANGIOGRAPHY N/A 06/27/2017  Procedure: LEFT HEART CATH AND CORS/GRAFTS ANGIOGRAPHY;  Surgeon: [name], MD;  Location: [facility];  Service: Cardiovascular;  Laterality: N/A;  NASAL SINUS SURGERY    SHOULDER OPEN ROTATOR CUFF REPAIR Right   TOTAL KNEE ARTHROPLASTY Bilateral  HPI: Pt is a 88 yo male presenting for OP MBS due to chronic cough and concern for PNA. CXR 02/15/22 with atelectasis and trace R pleural effusion. Pt's daughter reports recent coughing with PO intake, but says that all of his coughing has been significantly reduced since starting amoxicillin, including during meal times. Esophagram 11/05/21 showed mild to moderate esophageal dysmotility with tertiary contractions. MBS 10/09/2018 showed one instance of sensed aspiration and one instance of trace silent aspiration, with airway protection improved with cues to slow down or use a chin tuck with a straw. PMH also includes: CVA (2020), GERD, reflux esophagitis, COPD, adenocarcinoma of R lung, SDH (see aditional hx listed above)  Subjective: daughter says he has been coughing more over the past few weeks with meals, but this has improved since he started his antibiotics  Recommendations for follow up therapy are one component of a multi-disciplinary discharge planning process, led by the attending physician.  Recommendations may be updated based on patient status, additional functional criteria and insurance authorization. Assessment / Plan / Recommendation   02/17/2022   1:00 PM Clinical Impressions Clinical Impression Pt has a mild oropharyngeal dysphagia that appears to be relatively consistent with previous MBS from 2020. Suspected soft tissue calcifications noted (discussed with radiology PA) that also appear to be consistent with previous imaging upon review. Pt has mild oral deficits R>L, suspected to be  from prior stroke. Anterior loss is observed even with small pieces of solids. He has some reduced bolus cohesion and posterior spillage with thin liquids that reach the pyriform sinuses before the swallow. With sequential cup drinking, sensed aspiration occurred x1 as a result of reduced oral control and spillage that filled the pyrform sinuses, with cough ineffective at clearing the airway (PAS 7). When pt took one sip at a time, penetration  was trace and cleared spontaneously (PAS 2), which would not be atypical for age. Pt does have some risk for prandial aspiration, but also could be at risk for post-prandial aspiration in the setting of known esophageal dysphagia as well. He is also someone who could be more predisposed to prandial aspiration in the setting of any other acute illness that could decondition him. Therefore, despite relatively stable swallowing, could consider HH SLP f/u to maximize use of strategies as well as provide education on aspiration and esophageal precautions. SLP Visit Diagnosis Dysphagia, oropharyngeal phase (R13.12) Impact on safety and function Moderate aspiration risk     02/17/2022   1:00 PM Treatment Recommendations Treatment Recommendations Defer treatment plan to f/u with SLP     02/17/2022   1:00 PM Prognosis Prognosis for Safe Diet Advancement Good   02/17/2022   1:00 PM Diet Recommendations SLP Diet Recommendations Regular solids;Thin liquid Liquid Administration via Cup;Straw Medication Administration Whole meds with liquid Compensations Minimize environmental distractions;Slow rate;Small sips/bites;Other (Comment) Postural Changes Seated upright at 90 degrees;Remain semi-upright after after feeds/meals (Comment)     02/17/2022   1:00 PM Other Recommendations Oral Care Recommendations Oral care BID Follow Up Recommendations Home health SLP Functional Status Assessment Patient has had a recent decline in their functional status and demonstrates the ability to make  significant improvements in function in a reasonable and predictable amount of time.   08/23/2018   9:30 AM Frequency and Duration  Speech Therapy Frequency (ACUTE ONLY) min 2x/week Treatment Duration 1 week     02/17/2022   1:00 PM Oral Phase Oral Phase Impaired Oral - Thin Cup Right anterior bolus loss;Premature spillage Oral - Thin Straw Right anterior bolus loss;Premature spillage Oral - Puree Delayed oral transit Oral - Regular Right anterior bolus loss Oral - Pill [facility]    02/17/2022   1:00 PM Pharyngeal Phase Pharyngeal Phase Impaired Pharyngeal- Thin Cup Penetration/Aspiration before swallow;Penetration/Aspiration during swallow;Delayed swallow initiation-pyriform sinuses;Reduced airway/laryngeal closure Pharyngeal Material enters airway, passes BELOW cords and not ejected out despite cough attempt by patient Pharyngeal- Thin Straw Penetration/Aspiration before swallow;Penetration/Aspiration during swallow;Delayed swallow initiation-pyriform sinuses;Reduced airway/laryngeal closure Pharyngeal Material enters airway, remains ABOVE vocal cords then ejected out Pharyngeal- Puree WFL Pharyngeal- Regular WFL Pharyngeal- Pill [facility]    02/17/2022   1:00 PM Cervical Esophageal Phase  Cervical Esophageal Phase [facility] Osie Bond., M.A. [place] Office [phone] Secure chat preferred 02/17/2022, 3:45 PM      CLINICAL DATA:  Dysphagia.  Coughing at meals. EXAM: MODIFIED BARIUM SWALLOW TECHNIQUE: Different consistencies of barium were administered orally to the patient by the Speech Pathologist. Imaging of the pharynx was performed in the lateral projection. Hayley Boisseau, PA-C was present in the fluoroscopy room during this study, which was supervised and interpreted by Logan Bores, MD. FLUOROSCOPY: Radiation Exposure Index (as provided by the fluoroscopic device): 16.59 mGy Kerma COMPARISON:  Esophagram 02/17/22. FINDINGS: Vestibular Penetration: Flash penetration occurs with sips of thin  barium. Aspiration: Aspiration of thin consistency barium occurs with serial sips of thin barium. Cough reflex does occur and some material remains in the larynx after clearing, leading to repeat aspiration. Other:  None. IMPRESSION: Aspiration on serial swallows of thin barium. Please refer to the Speech Pathologist's report for complete details and recommendations. Electronically Signed   By: Logan Bores M.D.   On: 02/17/2022 14:11  DG Chest 2 View  Result Date: 02/15/2022 CLINICAL DATA:  Hemoptysis EXAM: CHEST - 2 VIEW COMPARISON:  Chest x-ray dated April 01, 2021 FINDINGS: Patient is rotated to the left. Cardiac and mediastinal contours are unchanged post median sternotomy. Similar mild coarse interstitial opacities, likely due to underlying chronic interstitial disease. Right basilar atelectasis and trace right pleural effusion. No new airspace opacity. No pneumothorax. IMPRESSION: Right basilar atelectasis and trace right pleural effusion. Electronically Signed   By: Yetta Glassman M.D.   On: 02/15/2022 13:25     TODAY-DAY OF DISCHARGE:  Subjective:   Evan [name] today has no headache,no chest abdominal pain,no new weakness tingling or numbness, feels much better wants to go home today.   Objective:   Blood pressure 135/80, pulse 61, temperature 97.9 F (36.6 C), temperature source Oral, resp. rate (!) 23, weight 75.5 kg, SpO2 96 %.  Intake/Output Summary (Last 24 hours) at 03/15/2022 0842 Last data filed at 03/15/2022 0700 Gross per 24 hour  Intake 159.98 ml  Output 1540 ml  Net -1380.02 ml   Filed Weights   03/13/22 0400 03/14/22 0308 03/15/22 0600  Weight: 73.5 kg 76.4 kg 75.5 kg    Exam: Awake Alert, Oriented *3, No new F.N deficits, Normal affect .AT,PERRAL Supple Neck,No JVD, No cervical lymphadenopathy appriciated.  Symmetrical Chest wall movement, Good air movement bilaterally, CTAB RRR,No Gallops,Rubs or new Murmurs, No Parasternal Heave +ve B.Sounds, Abd  Soft, Non tender, No organomegaly appriciated, No rebound -guarding or rigidity. No Cyanosis, Clubbing or edema, No new Rash or bruise   PERTINENT RADIOLOGIC STUDIES: No results found.   PERTINENT LAB RESULTS: CBC: Recent Labs    03/13/22 0031 03/14/22 0029  WBC 6.7 7.8  HGB 8.6* 8.4*  HCT 25.9* 25.4*  PLT 85* 88*   CMET CMP     Component Value Date/Time   NA 132 (L) 03/14/2022 0029   NA 139 11/01/2021 1542   NA 143 08/16/2016 1155   K 3.5 03/14/2022 0029   K 4.3 08/16/2016 1155   CL 103 03/14/2022 0029   CO2 18 (L) 03/14/2022 0029   CO2 24 08/16/2016 1155   GLUCOSE 109 (H) 03/14/2022 0029   GLUCOSE 88 08/16/2016 1155   GLUCOSE 120 (H) 04/20/2015 1600   BUN 23 03/14/2022 0029   BUN 20 11/01/2021 1542   BUN 14.6 08/16/2016 1155   CREATININE 1.41 (H) 03/14/2022 0029   CREATININE 1.49 (H) 01/05/2022 1446   CREATININE 1.2 08/16/2016 1155   CALCIUM 8.0 (L) 03/14/2022 0029   CALCIUM 9.0 08/16/2016 1155   PROT 5.3 (L) 03/13/2022 0031   PROT 6.4 06/22/2020 0953   PROT 6.7 08/16/2016 1155   ALBUMIN 2.5 (L) 03/13/2022 0031   ALBUMIN 3.9 06/22/2020 0953   ALBUMIN 3.6 08/16/2016 1155   AST 15 03/13/2022 0031   AST 18 08/16/2016 1155   ALT 8 03/13/2022 0031   ALT 10 08/16/2016 1155   ALKPHOS 63 03/13/2022 0031   ALKPHOS 101 08/16/2016 1155   BILITOT 0.9 03/13/2022 0031   BILITOT 0.7 06/22/2020 0953   BILITOT 1.26 (H) 08/16/2016 1155   GFRNONAA 47 (L) 03/14/2022 0029   GFRNONAA 47 (L) 06/22/2020 1602   GFRAA 54 (L) 06/22/2020 1602    GFR Estimated Creatinine Clearance: 30.4 mL/min (A) (by C-G formula based on SCr of 1.41 mg/dL (H)). No results for input(s): "LIPASE", "AMYLASE" in the last 72 hours. No results for input(s): "CKTOTAL", "CKMB", "CKMBINDEX", "TROPONINI" in the last 72 hours. Invalid input(s): "POCBNP" No results for input(s): "DDIMER" in the last 72 hours. No results for input(s): "HGBA1C" in the last 72 hours. No results for input(s): "CHOL",  "  HDL", "LDLCALC", "TRIG", "CHOLHDL", "LDLDIRECT" in the last 72 hours. No results for input(s): "TSH", "T4TOTAL", "T3FREE", "THYROIDAB" in the last 72 hours.  Invalid input(s): "FREET3" No results for input(s): "VITAMINB12", "FOLATE", "FERRITIN", "TIBC", "IRON", "RETICCTPCT" in the last 72 hours. Coags: Recent Labs    03/12/22 1148 03/13/22 0031  INR 2.4* 2.2*   Microbiology: Recent Results (from the past 240 hour(s))  Resp panel by RT-PCR (RSV, Flu A&B, Covid) Anterior Nasal Swab     Status: None   Collection Time: 03/12/22 11:36 AM   Specimen: Anterior Nasal Swab  Result Value Ref Range Status   SARS Coronavirus 2 by RT PCR NEGATIVE NEGATIVE Final    Comment: (NOTE) SARS-CoV-2 target nucleic acids are NOT DETECTED.  The SARS-CoV-2 RNA is generally detectable in upper respiratory specimens during the acute phase of infection. The lowest concentration of SARS-CoV-2 viral copies this assay can detect is 138 copies/mL. A negative result does not preclude SARS-Cov-2 infection and should not be used as the sole basis for treatment or other patient management decisions. A negative result may occur with  improper specimen collection/handling, submission of specimen other than nasopharyngeal swab, presence of viral mutation(s) within the areas targeted by this assay, and inadequate number of viral copies(<138 copies/mL). A negative result must be combined with clinical observations, patient history, and epidemiological information. The expected result is Negative.  Fact Sheet for Patients:  EntrepreneurPulse.com.au  Fact Sheet for Healthcare Providers:  IncredibleEmployment.be  This test is no t yet approved or cleared by the [place] FDA and  has been authorized for detection and/or diagnosis of SARS-CoV-2 by FDA under an Emergency Use Authorization (EUA). This EUA will remain  in effect (meaning this test can be used) for the duration of  the COVID-19 declaration under Section 564(b)(1) of the Act, 21 U.S.C.section 360bbb-3(b)(1), unless the authorization is terminated  or revoked sooner.       Influenza A by PCR NEGATIVE NEGATIVE Final   Influenza B by PCR NEGATIVE NEGATIVE Final    Comment: (NOTE) The Xpert Xpress SARS-CoV-2/FLU/RSV plus assay is intended as an aid in the diagnosis of influenza from Nasopharyngeal swab specimens and should not be used as a sole basis for treatment. Nasal washings and aspirates are unacceptable for Xpert Xpress SARS-CoV-2/FLU/RSV testing.  Fact Sheet for Patients: EntrepreneurPulse.com.au  Fact Sheet for Healthcare Providers: IncredibleEmployment.be  This test is not yet approved or cleared by the [place] FDA and has been authorized for detection and/or diagnosis of SARS-CoV-2 by FDA under an Emergency Use Authorization (EUA). This EUA will remain in effect (meaning this test can be used) for the duration of the COVID-19 declaration under Section 564(b)(1) of the Act, 21 U.S.C. section 360bbb-3(b)(1), unless the authorization is terminated or revoked.     Resp Syncytial Virus by PCR NEGATIVE NEGATIVE Final    Comment: (NOTE) Fact Sheet for Patients: EntrepreneurPulse.com.au  Fact Sheet for Healthcare Providers: IncredibleEmployment.be  This test is not yet approved or cleared by the [place] FDA and has been authorized for detection and/or diagnosis of SARS-CoV-2 by FDA under an Emergency Use Authorization (EUA). This EUA will remain in effect (meaning this test can be used) for the duration of the COVID-19 declaration under Section 564(b)(1) of the Act, 21 U.S.C. section 360bbb-3(b)(1), unless the authorization is terminated or revoked.  Performed at [facility], [place] [address], [place], [place] [address]   Blood Culture (routine x 2)     Status: None (Preliminary result)  Collection Time: 03/12/22 11:41 AM   Specimen: BLOOD  Result Value Ref Range Status   Specimen Description BLOOD LEFT ANTECUBITAL  Final   Special Requests   Final    BOTTLES DRAWN AEROBIC AND ANAEROBIC Blood Culture results may not be optimal due to an inadequate volume of blood received in culture bottles   Culture   Final    NO GROWTH 2 DAYS Performed at [facility] [address] [place], [place] [address]    Report Status PENDING  Incomplete  Blood Culture (routine x 2)     Status: None (Preliminary result)   Collection Time: 03/12/22 11:48 AM   Specimen: BLOOD LEFT HAND  Result Value Ref Range Status   Specimen Description BLOOD LEFT HAND  Final   Special Requests   Final    BOTTLES DRAWN AEROBIC AND ANAEROBIC Blood Culture adequate volume   Culture   Final    NO GROWTH 2 DAYS Performed at [facility], [place] [address] [place], [place] [address]    Report Status PENDING  Incomplete  Urine Culture     Status: Abnormal   Collection Time: 03/12/22  2:03 PM   Specimen: In/Out Cath Urine  Result Value Ref Range Status   Specimen Description IN/OUT CATH URINE  Final   Special Requests   Final    NONE Performed at [facility], [place] [address], [place], [place] [address]    Culture MULTIPLE SPECIES PRESENT, SUGGEST RECOLLECTION (A)  Final   Report Status 03/13/2022 FINAL  Final    FURTHER DISCHARGE INSTRUCTIONS:  Get Medicines reviewed and adjusted: Please take all your medications with you for your next visit with your Primary MD  Laboratory/radiological data: Please request your Primary MD to go over all hospital tests and procedure/radiological results at the follow up, please ask your Primary MD to get all Hospital records sent to his/her office.  In some cases, they will be blood work, cultures and biopsy results pending at the time of your discharge. Please request that your primary care M.D. goes through all the records of your hospital data and  follows up on these results.  Also Note the following: If you experience worsening of your admission symptoms, develop shortness of breath, life threatening emergency, suicidal or homicidal thoughts you must seek medical attention immediately by calling 911 or calling your MD immediately  if symptoms less severe.  You must read complete instructions/literature along with all the possible adverse reactions/side effects for all the Medicines you take and that have been prescribed to you. Take any new Medicines after you have completely understood and accpet all the possible adverse reactions/side effects.   Do not drive when taking Pain medications or sleeping medications (Benzodaizepines)  Do not take more than prescribed Pain, Sleep and Anxiety Medications. It is not advisable to combine anxiety,sleep and pain medications without talking with your primary care practitioner  Special Instructions: If you have smoked or chewed Tobacco  in the last 2 yrs please stop smoking, stop any regular Alcohol  and or any Recreational drug use.  Wear Seat belts while driving.  Please note: You were cared for by a hospitalist during your hospital stay. Once you are discharged, your primary care physician will handle any further medical issues. Please note that NO REFILLS for any discharge medications will be authorized once you are discharged, as it is imperative that you return to your primary care physician (or establish a relationship with a  primary care physician if you do not have one) for your post hospital discharge needs so that they can reassess your need for medications and monitor your lab values.  Total Time spent coordinating discharge including counseling, education and face to face time equals greater than 30 minutes.  SignedOren Binet 03/15/2022 8:42 AM

## 2022-03-15 NOTE — Care Management Important Message (Signed)
Important Message  Patient Details  Name: Evan [name] MRN: [id number] Date of Birth: 05-24-1927   Medicare Important Message Given:  Yes     [name] 03/15/2022, 1:48 PM

## 2022-03-17 ENCOUNTER — Telehealth: Payer: Self-pay

## 2022-03-17 DIAGNOSIS — I252 Old myocardial infarction: Secondary | ICD-10-CM | POA: Diagnosis not present

## 2022-03-17 DIAGNOSIS — S0190XA Unspecified open wound of unspecified part of head, initial encounter: Secondary | ICD-10-CM | POA: Diagnosis not present

## 2022-03-17 DIAGNOSIS — J440 Chronic obstructive pulmonary disease with acute lower respiratory infection: Secondary | ICD-10-CM | POA: Diagnosis not present

## 2022-03-17 DIAGNOSIS — I13 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 failure and stage 1 through stage 4 chronic kidney disease, or unspecified chronic kidney disease: Secondary | ICD-10-CM | POA: Diagnosis not present

## 2022-03-17 DIAGNOSIS — S32010D Wedge compression fracture of first lumbar vertebra, subsequent encounter for fracture with routine healing: Secondary | ICD-10-CM | POA: Diagnosis not present

## 2022-03-17 DIAGNOSIS — I4821 Permanent atrial fibrillation: Secondary | ICD-10-CM | POA: Diagnosis not present

## 2022-03-17 DIAGNOSIS — N1831 Chronic kidney disease, stage 3a: Secondary | ICD-10-CM | POA: Diagnosis not present

## 2022-03-17 DIAGNOSIS — L89519 Pressure ulcer of right ankle, unspecified stage: Secondary | ICD-10-CM | POA: Diagnosis not present

## 2022-03-17 DIAGNOSIS — E785 Hyperlipidemia, unspecified: Secondary | ICD-10-CM | POA: Diagnosis not present

## 2022-03-17 DIAGNOSIS — I5042 Chronic combined systolic (congestive) and diastolic (congestive) heart failure: Secondary | ICD-10-CM | POA: Diagnosis not present

## 2022-03-17 DIAGNOSIS — J189 Pneumonia, unspecified organism: Secondary | ICD-10-CM | POA: Diagnosis not present

## 2022-03-17 DIAGNOSIS — L8940 Pressure ulcer of contiguous site of back, buttock and hip, unspecified stage: Secondary | ICD-10-CM | POA: Diagnosis not present

## 2022-03-17 LAB — CULTURE, BLOOD (ROUTINE X 2)
Culture: NO GROWTH
Culture: NO GROWTH
Special Requests: ADEQUATE

## 2022-03-17 NOTE — Telephone Encounter (Signed)
Transition Care Management Follow-up Telephone Call Date of discharge and from where: [facility]/12; [facility] How have you been since you were released from the hospital? Stable  Any questions or concerns? No  Items Reviewed: Did the pt receive and understand the discharge instructions provided? Yes  Medications obtained and verified? Yes  Other?  N/a Any new allergies since your discharge? No  Dietary orders reviewed? Yes Do you have support at home? Yes   Home Care and Equipment/Supplies: Were home health services ordered? yes If so, what is the name of the agency? [facility]  Has the agency set up a time to come to the patient's home? yes Were any new equipment or medical supplies ordered?  No What is the name of the medical supply agency? N/a Were you able to get the supplies/equipment? not applicable Do you have any questions related to the use of the equipment or supplies? No  Functional Questionnaire: (I = Independent and D = Dependent) ADLs: D  Bathing/Dressing- D  Meal Prep- D  Eating- I  Maintaining continence- D  Transferring/Ambulation- D  Managing Meds- D  Follow up appointments reviewed:  PCP Hospital f/u appt confirmed? Yes  Scheduled to see [name] on 03/25/22 @ 4. [facility] f/u appt confirmed? Yes  Scheduled to see [facility] on 12/28 @ . Are transportation arrangements needed? No  If their condition worsens, is the pt aware to call PCP or go to the Emergency Dept.? Yes Was the patient provided with contact information for the PCP's office or ED? Yes Was to pt encouraged to call back with questions or concerns? Yes

## 2022-03-22 ENCOUNTER — Ambulatory Visit
Admission: RE | Admit: 2022-03-22 | Discharge: 2022-03-22 | Disposition: A | Discharge status: Home / Self Care | Payer: Medicare HMO | Source: Ambulatory Visit | Attending: Family Medicine | Admitting: Family Medicine

## 2022-03-22 DIAGNOSIS — Z85118 Personal history of other malignant neoplasm of bronchus and lung: Secondary | ICD-10-CM | POA: Diagnosis not present

## 2022-03-22 DIAGNOSIS — R042 Hemoptysis: Secondary | ICD-10-CM | POA: Diagnosis not present

## 2022-03-22 DIAGNOSIS — J90 Pleural effusion, not elsewhere classified: Secondary | ICD-10-CM | POA: Diagnosis not present

## 2022-03-22 DIAGNOSIS — J929 Pleural plaque without asbestos: Secondary | ICD-10-CM | POA: Diagnosis not present

## 2022-03-22 DIAGNOSIS — R918 Other nonspecific abnormal finding of lung field: Secondary | ICD-10-CM | POA: Diagnosis not present

## 2022-03-22 MED ORDER — IOPAMIDOL (ISOVUE-300) INJECTION 61%
75.0000 mL | Freq: Once | INTRAVENOUS | Status: AC | PRN
  Administered 2022-03-22: 75 mL via INTRAVENOUS

## 2022-03-24 ENCOUNTER — Telehealth: Payer: Self-pay

## 2022-03-24 NOTE — Telephone Encounter (Deleted)
[place]ols, RN w/[facility]  [phone]  Verbal Order [place] Nurse: 1-once a week  for 4 weeks   -1-once everyother week for additional 4 weeks includes wound dressing   Pt has ulcer wound on his sacrum and wound on his r-arm, doing dressing.   "Ok' per orders requested for pt care needs on behalf of [name]

## 2022-03-24 NOTE — Telephone Encounter (Signed)
Ighols, RN w/[facility]   [phone]   Verbal Order [place] Nurse: 1-once a week  for 4 weeks   -1-once everyother week for additional 4 weeks includes wound dressing    Pt has ulcer wound on his sacrum and wound on his r-arm, doing dressing.    "Ok' per orders requested for pt care needs on behalf of Dr. Lynann Bologna

## 2022-03-25 ENCOUNTER — Ambulatory Visit (INDEPENDENT_AMBULATORY_CARE_PROVIDER_SITE_OTHER): Payer: Medicare HMO | Admitting: Family Medicine

## 2022-03-25 VITALS — BP 120/76 | HR 65 | Ht 63.0 in | Wt 157.4 lb

## 2022-03-25 DIAGNOSIS — D649 Anemia, unspecified: Secondary | ICD-10-CM | POA: Diagnosis not present

## 2022-03-25 DIAGNOSIS — D72829 Elevated white blood cell count, unspecified: Secondary | ICD-10-CM

## 2022-03-25 DIAGNOSIS — R2689 Other abnormalities of gait and mobility: Secondary | ICD-10-CM | POA: Diagnosis not present

## 2022-03-25 DIAGNOSIS — J189 Pneumonia, unspecified organism: Secondary | ICD-10-CM | POA: Diagnosis not present

## 2022-03-25 DIAGNOSIS — R531 Weakness: Secondary | ICD-10-CM

## 2022-03-25 NOTE — Progress Notes (Signed)
Subjective:   Patient recently admitted to the hospital with subjective fevers.  DC summary included below for my reference:   Patient ID: Evan [name], male    DOB: 1927-11-19, 88 y.o.   MRN: [id number] Admit Date: 03/12/2022 Discharge date: 03/15/2022    Brief Summary: Patient is a 88 y.o.  male with history of CKD stage IIIa, prior CVA, CAD s/p CABG, PE on anticoagulation, A-fib, HLD, BPH, essential tremor-who presented with subjective fever, chills-cough-thought to have PNA and subsequently admitted to the hospitalist service.   Significant events: 12/09>> admit to [facility] likely due to PNA.   Significant studies: 01/10>> echo: EF 45-50% 12/09>> CXR: Hazy opacity right costophrenic angle. 12/09>> CT head: No acute intracranial malady 12/09>> CT chest/abdomen/pelvis: Poor study-no acute abnormality in chest/abdomen/pelvis.  Distended urinary bladder.  Age-indeterminate L1 compression fracture.   Significant microbiology data: 12/09>> COVID/influenza/RSV: Negative 12/09>> urine culture: Multiple species 12/09>> blood culture: No growth   Brief Hospital Course: SIRS Probable PNA Afebrile-nontoxic-appearing  Significantly better compared to how he first presented with empiric antibiotics-thought to have pneumonia although radiological studies not definitive. Since improved with empiric antibiotics-we will transition to oral antimicrobial therapy for a few more days. Cultures remain negative-stable for discharge today.   Chronic combined HFpEF/HFrEF Euvolemic By cardiology note-did not tolerate Entresto Continue Lasix/beta-blocker   Chronic atrial fibrillation Rate controlled with metoprolol Continue Xarelto   CAD s/p CABG 1998-s/p PCI 2008 No anginal symptoms   Mild aortic stenosis Supportive care   CKD stage IIIa At baseline Follow electrolytes periodically   Acute urinary retention BPH Had acute urinary retention on admission-Foley catheter  placed Continue Flomax-now on finasteride Foley catheter removed 12/11-voiding spontaneously Please ensure follow-up with urology post discharge.   History of subdural hematoma following a fall 2018 History of CVA Supportive care PT/OT eval completed-Home health recommended.   Normocytic anemia Likely due to chronic disease Slight drop in hemoglobin overnight likely due to hemodilution No evidence of GI bleeding/blood loss Follow CBC as an outpatient   Thrombocytopenia Mild-appears to be a chronic issue Follow CBC as an outpatient  03/25/22 Had CT scan of chest 12/19 previously scheduled for hemoptysis: IMPRESSION: 1. Small, right larger than left, pleural effusions increased in size from the recent prior CT. Dependent lung base opacities also increased, right greater than left, consistent with atelectasis. A component this on the right may reflect radiation induced scarring. 2. No convincing pneumonia. 3. Interstitial thickening and areas of peripheral reticulation are without significant change from prior CT, consistent with interstitial fibrosis. Cannot exclude a component of interstitial edema. 4. No lung mass or suspicious nodule. No findings to suggest new or recurrent lung carcinoma. 5. Coronary artery calcifications and aortic atherosclerosis.  Patient is here today with his daughter.  She states that the day of admission, he was extremely confused and disoriented.  He had a fever.  She was concerned that he had a urinary tract infection so she took him to the hospital.  There he was found to have leukocytosis with a white count of 11.5.  However CT scan was unable to identify any source of infection.  Urinalysis showed multiple species that with a diagnosis for UTI.  Blood culture were negative however patient's leukocytosis improved with IV fluids and antibiotics that he was given a course of antibiotics to cover most likely pneumonia.  He has been off antibiotics now  for over a week.  1 big concern at the hospital with a drop in his  hemoglobin.  His baseline hemoglobin is between 10 and 11 however at discharge from the hospital it was 8.5.  He is taking Xarelto for stroke prevention.  Daughter denies any melena or hematochezia.  He was having urinary retention and they placed a Foley.  The Foley was removed on the 11th and he voided spontaneously.  He has started finasteride and he is going to the bathroom although he does report weak urinary stream.  He has an appointment to see his urologist after Christmas. Past Medical History:  Diagnosis Date   Adenocarcinoma of right lung ([place]) 01/06/2016   Anginal chest pain at rest    Chronic non, controlled on Lorazepam   Anxiety    Arthritis    "all over"    BPH (benign prostatic hyperplasia)    CAD (coronary artery disease)    Chronic lower back pain    Complication of anesthesia    "problems making water afterwards"   Depression    DJD (degenerative joint disease)    Esophageal ulcer without bleeding    bid ppi indefinitely   Family history of adverse reaction to anesthesia    "children w/PONV"   GERD (gastroesophageal reflux disease)    Headache    "couple/week maybe" (09/04/2015)   Hemochromatosis    Possible, elevated iron stores, hook-like osteophytes on hand films, normal LFTs   Hepatitis    "yellow jaundice as a baby"   History of ASCVD    MULTIVESSEL   Hyperlipidemia    Hypertension    Mild aortic stenosis 06/23/2021   Osteoarthritis    Pneumonia    "several times; got a little now" (09/04/2015)   Rheumatoid arthritis ([place])    Subdural hematoma ([place])    small after fall 09/2016-plavix held, neurosurgery consulted.  Asymptomatic.     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   on Lovenox lifelong since he failed PO Eliquis   Past Surgical History:  Procedure Laterality Date   ARM SKIN LESION BIOPSY / EXCISION Left 09/02/2015   CATARACT EXTRACTION W/ INTRAOCULAR LENS  IMPLANT, BILATERAL Bilateral     CHOLECYSTECTOMY OPEN     CORNEAL TRANSPLANT Bilateral    "one at [facility]; one at [facility]"   [place] W/ STENT     DE stent ostium into the right radial free graft at OM-, 02-2007   ESOPHAGOGASTRODUODENOSCOPY N/A 11/09/2014   Procedure: ESOPHAGOGASTRODUODENOSCOPY (EGD);  Surgeon: [name], MD;  Location: [facility] ENDOSCOPY;  Service: Endoscopy;  Laterality: N/A;   INGUINAL HERNIA REPAIR     JOINT REPLACEMENT     LEFT HEART CATH AND CORS/GRAFTS ANGIOGRAPHY N/A 06/27/2017   Procedure: LEFT HEART CATH AND CORS/GRAFTS ANGIOGRAPHY;  Surgeon: [name], MD;  Location: [place] CV LAB;  Service: Cardiovascular;  Laterality: N/A;   NASAL SINUS SURGERY     SHOULDER OPEN ROTATOR CUFF REPAIR Right    TOTAL KNEE ARTHROPLASTY Bilateral    Current Outpatient Medications on File Prior to Visit  Medication Sig Dispense Refill   acetaminophen (TYLENOL) 500 MG tablet Take 500 mg by mouth every 6 (six) hours as needed for mild pain, moderate pain, fever or headache.     Cholecalciferol (VITAMIN D-3 PO) Take 1 capsule by mouth in the morning.     finasteride (PROSCAR) 5 MG tablet Take 1 tablet (5 mg total) by mouth daily. 30 tablet 0   FLUoxetine (PROZAC) 20 MG capsule TAKE  1 CAPSULE EVERY DAY (Patient taking differently: Take 20 mg by mouth in the morning.) 90 capsule 0   furosemide (LASIX) 20 MG tablet Take 1 tablet (20 mg total) by mouth daily. (Patient taking differently: Take 20 mg by mouth in the morning.) 90 tablet 3   Lifitegrast (XIIDRA) 5 % SOLN Place 1 drop into both eyes 2 (two) times daily. (Patient taking differently: Place 1 drop into both eyes 2 (two) times daily as needed (dry eyes).) 60 each 12   metoprolol tartrate (LOPRESSOR) 25 MG tablet TAKE 1/2 TABLET TWICE DAILY (Patient taking differently: Take 25 mg by mouth in the morning.) 90 tablet 3   nitroGLYCERIN (NITROSTAT) 0.4 MG SL tablet  Place 1 tablet (0.4 mg total) under the tongue every 5 (five) minutes as needed for chest pain. 25 tablet 2   pantoprazole (PROTONIX) 40 MG tablet TAKE 1 TABLET EVERY DAY (Patient taking differently: Take 40 mg by mouth in the morning.) 90 tablet 3   polyethylene glycol (MIRALAX / GLYCOLAX) 17 g packet Take 17 g by mouth daily as needed for mild constipation. 14 each 0   rosuvastatin (CRESTOR) 5 MG tablet TAKE 1 TABLET EVERY DAY (Patient taking differently: Take 5 mg by mouth daily with supper.) 90 tablet 1   tacrolimus (PROTOPIC) 0.1 % ointment Apply 1 application topically 2 (two) times daily. (Patient not taking: Reported on 03/12/2022) 100 g 3   tamsulosin (FLOMAX) 0.4 MG CAPS capsule TAKE 1 CAPSULE EVERY DAY (Patient taking differently: Take 0.4 mg by mouth daily with supper.) 90 capsule 10   XARELTO 15 MG TABS tablet TAKE 1 TABLET EVERY DAY WITH SUPPER (Patient taking differently: Take 15 mg by mouth daily with supper.) 30 tablet 10   ZINC OXIDE EX Apply 1 application  topically 4 (four) times daily as needed (skin barrier).     No current facility-administered medications on file prior to visit.   Allergies  Allergen Reactions   Feldene [Piroxicam] Rash    Blistering rash   Neomycin-Polymyxin-Hc Itching   Statins Other (See Comments)    Myalgias   Valium [Diazepam] Other (See Comments)    Dizziness    Latex Rash   Tequin [Gatifloxacin] Anxiety   Vibra-Tab [Doxycycline] Nausea Only   Social History   Socioeconomic History   Marital status: Widowed    Spouse name: Not on file   Number of children: Not on file   Years of education: Not on file   Highest education level: Not on file  Occupational History   Not on file  Tobacco Use   Smoking status: Never    Passive exposure: Yes   Smokeless tobacco: Former    Types: Chew   Tobacco comments:    "quit chewing in the 1960s"  Vaping Use   Vaping Use: Never used  Substance and Sexual Activity   Alcohol use: No   Drug use: No    Sexual activity: Never  Other Topics Concern   Not on file  Social History Narrative   Not on file   Social Determinants of Health   Financial Resource Strain: Low Risk  (05/07/2021)   Overall Financial Resource Strain (CARDIA)    Difficulty of Paying Living Expenses: Not hard at all  Food Insecurity: No Food Insecurity (05/07/2021)   Hunger Vital Sign    Worried About Running Out of Food in the Last Year: Never true    [place] in the Last Year: Never true  Transportation Needs: No  Transportation Needs (05/07/2021)   PRAPARE - Hydrologist (Medical): No    Lack of Transportation (Non-Medical): No  Physical Activity: Insufficiently Active (05/07/2021)   Exercise Vital Sign    Days of Exercise per Week: 3 days    Minutes of Exercise per Session: 20 min  Stress: No Stress Concern Present (05/07/2021)   [place]    Feeling of Stress : Not at all  Social Connections: Socially Isolated (05/07/2021)   Social Connection and Isolation Panel [NHANES]    Frequency of Communication with Friends and Family: More than three times a week    Frequency of Social Gatherings with Friends and Family: More than three times a week    Attends Religious Services: Never    Marine scientist or Organizations: No    Attends Archivist Meetings: Never    Marital Status: Widowed  Intimate Partner Violence: Not At Risk (05/07/2021)   Humiliation, Afraid, Rape, and Kick questionnaire    Fear of Current or Ex-Partner: No    Emotionally Abused: No    Physically Abused: No    Sexually Abused: No      Review of Systems     Objective:   Physical Exam Vitals reviewed.  Constitutional:      General: He is not in acute distress.    Appearance: Normal appearance. He is not toxic-appearing or diaphoretic.  Cardiovascular:     Rate and Rhythm: Normal rate and regular rhythm.     Heart sounds:  Normal heart sounds. No murmur heard.    No friction rub. No gallop.  Pulmonary:     Effort: Pulmonary effort is normal. No respiratory distress.     Breath sounds: Rales present. No wheezing or rhonchi.    Musculoskeletal:     Right lower leg: No edema.     Left lower leg: No edema.  Neurological:     Mental Status: He is alert.   Right-sided hemiparesis with weakness in his right leg.  Weakness with knee flexion and extension and ankle flexion and extension.  Ataxia in the right upper arm and right lower leg.         Assessment & Plan:  Pneumonia of right lower lobe due to infectious organism - Plan: Ambulatory referral to [facility], CBC with Differential/Platelet, BASIC METABOLIC PANEL WITH GFR  Weakness generalized  Poor balance  Anemia, unspecified type  Leukocytosis, unspecified type Clinically, the patient is back to his baseline.  There are some faint right basilar Rales as shown above but these are chronic for the patient.  The most recent CT showed no convincing evidence of pneumonia.  He has been off antibiotics now for a week and the daughter denies any fever.  I will repeat a CBC and if his white blood cell count is stable, I do not feel that there is an indolent infection that has been inadequately treated.  My biggest concern is a drop in his hemoglobin.  If his hemoglobin continues to drop we will need discontinue Xarelto.  I recommended not to pursue invasive testing to try to find the source of the bleeding given his age and frail condition.  Instead I would hold his Xarelto and hope that the hemoglobin would stabilize.  Third his urinary retention seems to be somewhat better.  He is on finasteride and I will defer that to his urologist.  I will consult home health as the  patient is extremely weak and has poor balance due to deconditioning.

## 2022-03-26 LAB — BASIC METABOLIC PANEL WITH GFR
BUN/Creatinine Ratio: 17 (calc) (ref 6–22)
BUN: 22 mg/dL (ref 7–25)
CO2: 21 mmol/L (ref 20–32)
Calcium: 8.8 mg/dL (ref 8.6–10.3)
Chloride: 107 mmol/L (ref 98–110)
Creat: 1.28 mg/dL — ABNORMAL HIGH (ref 0.70–1.22)
Glucose, Bld: 90 mg/dL (ref 65–99)
Potassium: 4.2 mmol/L (ref 3.5–5.3)
Sodium: 137 mmol/L (ref 135–146)
eGFR: 53 mL/min/{1.73_m2} — ABNORMAL LOW (ref >=60)

## 2022-03-26 LAB — CBC WITH DIFFERENTIAL/PLATELET
Absolute Monocytes: 707 cells/uL (ref 200–950)
Basophils Absolute: 28 cells/uL (ref 0–200)
Basophils Relative: 0.4 %
Eosinophils Absolute: 98 cells/uL (ref 15–500)
Eosinophils Relative: 1.4 %
HCT: 29.9 % — ABNORMAL LOW (ref 38.5–50.0)
Hemoglobin: 9.8 g/dL — ABNORMAL LOW (ref 13.2–17.1)
Lymphs Abs: 1428 cells/uL (ref 850–3900)
MCH: 31.1 pg (ref 27.0–33.0)
MCHC: 32.8 g/dL (ref 32.0–36.0)
MCV: 94.9 fL (ref 80.0–100.0)
MPV: 11.4 fL (ref 7.5–12.5)
Monocytes Relative: 10.1 %
Neutro Abs: 4739 cells/uL (ref 1500–7800)
Neutrophils Relative %: 67.7 %
Platelets: 182 10*3/uL (ref 140–400)
RBC: 3.15 10*6/uL — ABNORMAL LOW (ref 4.20–5.80)
RDW: 12.9 % (ref 11.0–15.0)
Total Lymphocyte: 20.4 %
WBC: 7 10*3/uL (ref 3.8–10.8)

## 2022-03-31 DIAGNOSIS — R3914 Feeling of incomplete bladder emptying: Secondary | ICD-10-CM | POA: Diagnosis not present

## 2022-03-31 DIAGNOSIS — R3915 Urgency of urination: Secondary | ICD-10-CM | POA: Diagnosis not present

## 2022-04-01 DIAGNOSIS — R32 Unspecified urinary incontinence: Secondary | ICD-10-CM | POA: Diagnosis not present

## 2022-04-07 ENCOUNTER — Telehealth: Payer: Self-pay | Admitting: Family Medicine

## 2022-04-07 NOTE — Telephone Encounter (Signed)
Received call from [facility] with [facility] to request verbal orders for his [facility] occupational therapy once a week x 6 weeks.  Please advise at [phone].

## 2022-04-11 ENCOUNTER — Ambulatory Visit (INDEPENDENT_AMBULATORY_CARE_PROVIDER_SITE_OTHER): Payer: Medicare HMO | Admitting: Family Medicine

## 2022-04-11 ENCOUNTER — Other Ambulatory Visit (HOSPITAL_COMMUNITY): Payer: Self-pay

## 2022-04-11 ENCOUNTER — Other Ambulatory Visit: Payer: Self-pay | Admitting: Family Medicine

## 2022-04-11 VITALS — BP 124/72 | HR 57 | Ht 63.0 in | Wt 157.0 lb

## 2022-04-11 DIAGNOSIS — M25561 Pain in right knee: Secondary | ICD-10-CM | POA: Diagnosis not present

## 2022-04-11 MED ORDER — FINASTERIDE 5 MG PO TABS
5.0000 mg | ORAL_TABLET | Freq: Every day | ORAL | 3 refills | Status: DC
  Filled 2022-04-11 (×2): qty 90, 90d supply, fill #0
  Filled 2022-05-29 – 2022-08-18 (×2): qty 90, 90d supply, fill #1
  Filled 2023-01-08: qty 90, 90d supply, fill #2

## 2022-04-11 NOTE — Progress Notes (Signed)
Patient is here today with his daughter due to pain in his right knee.  Last week, his daughter noticed swelling over the superior lateral joint line.  There was tenderness and pain in that area and bruising.  He denies any falls.  However since then to the point, swelling bruising subsided.  He has purpura just superior and lateral to the patella.  This is roughly 3 to 4 cm in diameter however it is nontender in that area.  There is no discoloration or bruising other than that.  He has painless range of motion.  There is no effusion.  There is no tenderness to palpation around the bones.  He has right-sided hemiparesis and decreased sensation on the right side so I suspect that he likely twisted his knee and suffered a sprain however with time and topical Voltaren gel it seems to have improved  Past Medical History:  Diagnosis Date   Adenocarcinoma of right lung ([place]) 01/06/2016   Anginal chest pain at rest    Chronic non, controlled on Lorazepam   Anxiety    Arthritis    "all over"    BPH (benign prostatic hyperplasia)    CAD (coronary artery disease)    Chronic lower back pain    Complication of anesthesia    "problems making water afterwards"   Depression    DJD (degenerative joint disease)    Esophageal ulcer without bleeding    bid ppi indefinitely   Family history of adverse reaction to anesthesia    "children w/PONV"   GERD (gastroesophageal reflux disease)    Headache    "couple/week maybe" (09/04/2015)   Hemochromatosis    Possible, elevated iron stores, hook-like osteophytes on hand films, normal LFTs   Hepatitis    "yellow jaundice as a baby"   History of ASCVD    MULTIVESSEL   Hyperlipidemia    Hypertension    Mild aortic stenosis 06/23/2021   Osteoarthritis    Pneumonia    "several times; got a little now" (09/04/2015)   Rheumatoid arthritis ([place])    Subdural hematoma ([place])    small after fall 09/2016-plavix held, neurosurgery consulted.  Asymptomatic.      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   on Lovenox lifelong since he failed PO Eliquis   Past Surgical History:  Procedure Laterality Date   ARM SKIN LESION BIOPSY / EXCISION Left 09/02/2015   CATARACT EXTRACTION W/ INTRAOCULAR LENS  IMPLANT, BILATERAL Bilateral    CHOLECYSTECTOMY OPEN     CORNEAL TRANSPLANT Bilateral    "one at [facility]; one at [facility]"   [place] W/ STENT     DE stent ostium into the right radial free graft at [facility]-, 02-2007   ESOPHAGOGASTRODUODENOSCOPY N/A 11/09/2014   Procedure: ESOPHAGOGASTRODUODENOSCOPY (EGD);  Surgeon: [name], MD;  Location: [facility] ENDOSCOPY;  Service: Endoscopy;  Laterality: N/A;   INGUINAL HERNIA REPAIR     JOINT REPLACEMENT     LEFT HEART CATH AND CORS/GRAFTS ANGIOGRAPHY N/A 06/27/2017   Procedure: LEFT HEART CATH AND CORS/GRAFTS ANGIOGRAPHY;  Surgeon: [name], MD;  Location: [place] CV LAB;  Service: Cardiovascular;  Laterality: N/A;   NASAL SINUS SURGERY     SHOULDER OPEN ROTATOR CUFF REPAIR Right    TOTAL KNEE ARTHROPLASTY Bilateral    Current Outpatient Medications on File Prior to Visit  Medication Sig Dispense Refill   acetaminophen (TYLENOL) 500 MG tablet  Take 500 mg by mouth every 6 (six) hours as needed for mild pain, moderate pain, fever or headache.     Cholecalciferol (VITAMIN D-3 PO) Take 1 capsule by mouth in the morning.     finasteride (PROSCAR) 5 MG tablet Take 1 tablet (5 mg total) by mouth daily. 30 tablet 0   FLUoxetine (PROZAC) 20 MG capsule TAKE 1 CAPSULE EVERY DAY (Patient taking differently: Take 20 mg by mouth in the morning.) 90 capsule 0   furosemide (LASIX) 20 MG tablet Take 1 tablet (20 mg total) by mouth daily. (Patient taking differently: Take 20 mg by mouth in the morning.) 90 tablet 3   Lifitegrast (XIIDRA) 5 % SOLN Place 1 drop into both eyes 2 (two) times daily. (Patient taking differently: Place 1 drop into  both eyes 2 (two) times daily as needed (dry eyes).) 60 each 12   metoprolol tartrate (LOPRESSOR) 25 MG tablet TAKE 1/2 TABLET TWICE DAILY (Patient taking differently: Take 25 mg by mouth in the morning.) 90 tablet 3   nitroGLYCERIN (NITROSTAT) 0.4 MG SL tablet Place 1 tablet (0.4 mg total) under the tongue every 5 (five) minutes as needed for chest pain. 25 tablet 2   pantoprazole (PROTONIX) 40 MG tablet TAKE 1 TABLET EVERY DAY (Patient taking differently: Take 40 mg by mouth in the morning.) 90 tablet 3   polyethylene glycol (MIRALAX / GLYCOLAX) 17 g packet Take 17 g by mouth daily as needed for mild constipation. 14 each 0   rosuvastatin (CRESTOR) 5 MG tablet TAKE 1 TABLET EVERY DAY (Patient taking differently: Take 5 mg by mouth daily with supper.) 90 tablet 1   tacrolimus (PROTOPIC) 0.1 % ointment Apply 1 application topically 2 (two) times daily. 100 g 3   tamsulosin (FLOMAX) 0.4 MG CAPS capsule TAKE 1 CAPSULE EVERY DAY (Patient taking differently: Take 0.4 mg by mouth daily with supper.) 90 capsule 10   XARELTO 15 MG TABS tablet TAKE 1 TABLET EVERY DAY WITH SUPPER (Patient taking differently: Take 15 mg by mouth daily with supper.) 30 tablet 10   ZINC OXIDE EX Apply 1 application  topically 4 (four) times daily as needed (skin barrier).     No current facility-administered medications on file prior to visit.   Allergies  Allergen Reactions   Feldene [Piroxicam] Rash    Blistering rash   Neomycin-Polymyxin-Hc Itching   Statins Other (See Comments)    Myalgias   Valium [Diazepam] Other (See Comments)    Dizziness    Latex Rash   Tequin [Gatifloxacin] Anxiety   Vibra-Tab [Doxycycline] Nausea Only   Social History   Socioeconomic History   Marital status: Widowed    Spouse name: Not on file   Number of children: Not on file   Years of education: Not on file   Highest education level: Not on file  Occupational History   Not on file  Tobacco Use   Smoking status: Never     Passive exposure: Yes   Smokeless tobacco: Former    Types: Chew   Tobacco comments:    "quit chewing in the 1960s"  Vaping Use   Vaping Use: Never used  Substance and Sexual Activity   Alcohol use: No   Drug use: No   Sexual activity: Never  Other Topics Concern   Not on file  Social History Narrative   Not on file   Social Determinants of Health   Financial Resource Strain: Low Risk  (05/07/2021)   Overall Financial Resource  Strain (CARDIA)    Difficulty of Paying Living Expenses: Not hard at all  Food Insecurity: No Food Insecurity (05/07/2021)   Hunger Vital Sign    Worried About Running Out of Food in the Last Year: Never true    Ran Out of Food in the Last Year: Never true  Transportation Needs: No Transportation Needs (05/07/2021)   PRAPARE - Hydrologist (Medical): No    Lack of Transportation (Non-Medical): No  Physical Activity: Insufficiently Active (05/07/2021)   Exercise Vital Sign    Days of Exercise per Week: 3 days    Minutes of Exercise per Session: 20 min  Stress: No Stress Concern Present (05/07/2021)   [place]    Feeling of Stress : Not at all  Social Connections: Socially Isolated (05/07/2021)   Social Connection and Isolation Panel [NHANES]    Frequency of Communication with Friends and Family: More than three times a week    Frequency of Social Gatherings with Friends and Family: More than three times a week    Attends Religious Services: Never    Marine scientist or Organizations: No    Attends Archivist Meetings: Never    Marital Status: Widowed  Intimate Partner Violence: Not At Risk (05/07/2021)   Humiliation, Afraid, Rape, and Kick questionnaire    Fear of Current or Ex-Partner: No    Emotionally Abused: No    Physically Abused: No    Sexually Abused: No      Review of Systems     Objective:   Physical Exam Vitals reviewed.   Constitutional:      General: He is not in acute distress.    Appearance: Normal appearance. He is not toxic-appearing or diaphoretic.  Cardiovascular:     Rate and Rhythm: Normal rate and regular rhythm.     Heart sounds: Normal heart sounds. No murmur heard.    No friction rub. No gallop.  Pulmonary:     Effort: Pulmonary effort is normal. No respiratory distress.     Breath sounds: Normal breath sounds. No wheezing, rhonchi or rales.  Musculoskeletal:     Right knee: Ecchymosis present. No swelling, deformity, effusion or bony tenderness. Normal range of motion. No tenderness. Normal alignment.     Right lower leg: No edema.     Left lower leg: No edema.  Neurological:     Mental Status: He is alert.   Right-sided hemiparesis with weakness in his right leg.  Weakness with knee flexion and extension and ankle flexion and extension.  Ataxia in the right upper arm and right lower leg.         Assessment & Plan:  Acute pain of right knee I suspect the patient likely sprained his right knee due to decreased proprioception and his right-sided hemiparesis.  However the swelling and pain has subsided since last week simply using topical Voltaren gel.  Today I am able to passively flex and extend his right knee without eliciting any pain.  Firm palpation all around the knee joint does not elicit any pain.  There is no palpable abnormality.  There is no significant bruising although he does have a pourpura just superior and lateral to the patella.  Otherwise knee exam is normal.  No treatment is necessary at this time.

## 2022-04-16 ENCOUNTER — Other Ambulatory Visit: Payer: Self-pay | Admitting: Family Medicine

## 2022-04-16 DIAGNOSIS — I5042 Chronic combined systolic (congestive) and diastolic (congestive) heart failure: Secondary | ICD-10-CM | POA: Diagnosis not present

## 2022-04-16 DIAGNOSIS — J440 Chronic obstructive pulmonary disease with acute lower respiratory infection: Secondary | ICD-10-CM | POA: Diagnosis not present

## 2022-04-16 DIAGNOSIS — I4821 Permanent atrial fibrillation: Secondary | ICD-10-CM | POA: Diagnosis not present

## 2022-04-16 DIAGNOSIS — E785 Hyperlipidemia, unspecified: Secondary | ICD-10-CM | POA: Diagnosis not present

## 2022-04-16 DIAGNOSIS — N1831 Chronic kidney disease, stage 3a: Secondary | ICD-10-CM | POA: Diagnosis not present

## 2022-04-16 DIAGNOSIS — J189 Pneumonia, unspecified organism: Secondary | ICD-10-CM | POA: Diagnosis not present

## 2022-04-16 DIAGNOSIS — I13 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 failure and stage 1 through stage 4 chronic kidney disease, or unspecified chronic kidney disease: Secondary | ICD-10-CM | POA: Diagnosis not present

## 2022-04-16 DIAGNOSIS — S32010D Wedge compression fracture of first lumbar vertebra, subsequent encounter for fracture with routine healing: Secondary | ICD-10-CM | POA: Diagnosis not present

## 2022-04-16 DIAGNOSIS — I252 Old myocardial infarction: Secondary | ICD-10-CM | POA: Diagnosis not present

## 2022-04-18 NOTE — Telephone Encounter (Signed)
Requested Prescriptions  Pending Prescriptions Disp Refills   pantoprazole (PROTONIX) 40 MG tablet [Pharmacy Med Name: PANTOPRAZOLE SODIUM 40 MG Tablet Delayed Release] 90 tablet 0    Sig: Take 1 tablet (40 mg total) by mouth in the morning.     Gastroenterology: Proton Pump Inhibitors Passed - 04/16/2022  3:35 AM      Passed - Valid encounter within last 12 months    Recent Outpatient Visits           8 months ago Acute pain of right shoulder   [facility] Tanya Nones, Priscille Heidelberg, MD   9 months ago Dysuria   [facility] Donita Brooks, MD   10 months ago Acute systolic congestive heart failure [facility])   [facility] Donita Brooks, MD   11 months ago Acute cough   [facility] Valentino Nose, NP   1 year ago Need for immunization against influenza   [facility] Pickard, Priscille Heidelberg, MD

## 2022-04-19 ENCOUNTER — Telehealth: Payer: Self-pay | Admitting: Family Medicine

## 2022-04-19 NOTE — Telephone Encounter (Signed)
Received call from [place] with  [facility] to report the patient has a knott on his forehead from a fall he had last week while trying to get out of bed in the middle of the night; area slightly bruised, slightly raised, and slightly red. Evan [name] reports the patient didn't lose consciousness, and the site is not sore to the touch. Patient having headaches since last night and is unsure if his lack of appetite is causing them. Evan [name] advised patient to keep a watch on this headaches and contact this office if they continue.  Please advise Evan [name] with questions at [phone].

## 2022-04-27 ENCOUNTER — Inpatient Hospital Stay (HOSPITAL_COMMUNITY)
Admission: EM | Admit: 2022-04-27 | Discharge: 2022-05-03 | DRG: 291 | Disposition: A | Discharge status: Home Health | Payer: Medicare HMO | Attending: Family Medicine | Admitting: Family Medicine

## 2022-04-27 ENCOUNTER — Encounter (HOSPITAL_COMMUNITY): Payer: Self-pay

## 2022-04-27 ENCOUNTER — Emergency Department (HOSPITAL_COMMUNITY): Payer: Medicare HMO

## 2022-04-27 DIAGNOSIS — R079 Chest pain, unspecified: Secondary | ICD-10-CM | POA: Diagnosis present

## 2022-04-27 DIAGNOSIS — Z7901 Long term (current) use of anticoagulants: Secondary | ICD-10-CM | POA: Diagnosis not present

## 2022-04-27 DIAGNOSIS — J810 Acute pulmonary edema: Secondary | ICD-10-CM | POA: Diagnosis not present

## 2022-04-27 DIAGNOSIS — L89151 Pressure ulcer of sacral region, stage 1: Secondary | ICD-10-CM | POA: Diagnosis present

## 2022-04-27 DIAGNOSIS — R6883 Chills (without fever): Secondary | ICD-10-CM | POA: Diagnosis present

## 2022-04-27 DIAGNOSIS — J9601 Acute respiratory failure with hypoxia: Secondary | ICD-10-CM | POA: Diagnosis not present

## 2022-04-27 DIAGNOSIS — N400 Enlarged prostate without lower urinary tract symptoms: Secondary | ICD-10-CM | POA: Diagnosis present

## 2022-04-27 DIAGNOSIS — Z9842 Cataract extraction status, left eye: Secondary | ICD-10-CM

## 2022-04-27 DIAGNOSIS — E785 Hyperlipidemia, unspecified: Secondary | ICD-10-CM | POA: Diagnosis present

## 2022-04-27 DIAGNOSIS — I5021 Acute systolic (congestive) heart failure: Secondary | ICD-10-CM | POA: Diagnosis not present

## 2022-04-27 DIAGNOSIS — E876 Hypokalemia: Secondary | ICD-10-CM | POA: Diagnosis present

## 2022-04-27 DIAGNOSIS — R7989 Other specified abnormal findings of blood chemistry: Secondary | ICD-10-CM | POA: Diagnosis present

## 2022-04-27 DIAGNOSIS — N401 Enlarged prostate with lower urinary tract symptoms: Secondary | ICD-10-CM | POA: Diagnosis not present

## 2022-04-27 DIAGNOSIS — I4819 Other persistent atrial fibrillation: Secondary | ICD-10-CM | POA: Diagnosis present

## 2022-04-27 DIAGNOSIS — I2699 Other pulmonary embolism without acute cor pulmonale: Secondary | ICD-10-CM | POA: Diagnosis present

## 2022-04-27 DIAGNOSIS — I69251 Hemiplegia and hemiparesis following other nontraumatic intracranial hemorrhage affecting right dominant side: Secondary | ICD-10-CM | POA: Diagnosis not present

## 2022-04-27 DIAGNOSIS — Z8719 Personal history of other diseases of the digestive system: Secondary | ICD-10-CM

## 2022-04-27 DIAGNOSIS — I5020 Unspecified systolic (congestive) heart failure: Secondary | ICD-10-CM | POA: Diagnosis present

## 2022-04-27 DIAGNOSIS — I2510 Atherosclerotic heart disease of native coronary artery without angina pectoris: Secondary | ICD-10-CM | POA: Diagnosis present

## 2022-04-27 DIAGNOSIS — H9193 Unspecified hearing loss, bilateral: Secondary | ICD-10-CM | POA: Diagnosis present

## 2022-04-27 DIAGNOSIS — Z96653 Presence of artificial knee joint, bilateral: Secondary | ICD-10-CM | POA: Diagnosis present

## 2022-04-27 DIAGNOSIS — R0600 Dyspnea, unspecified: Secondary | ICD-10-CM | POA: Diagnosis not present

## 2022-04-27 DIAGNOSIS — J189 Pneumonia, unspecified organism: Secondary | ICD-10-CM | POA: Diagnosis present

## 2022-04-27 DIAGNOSIS — F32A Depression, unspecified: Secondary | ICD-10-CM | POA: Diagnosis present

## 2022-04-27 DIAGNOSIS — D509 Iron deficiency anemia, unspecified: Secondary | ICD-10-CM | POA: Diagnosis present

## 2022-04-27 DIAGNOSIS — I5023 Acute on chronic systolic (congestive) heart failure: Secondary | ICD-10-CM | POA: Diagnosis not present

## 2022-04-27 DIAGNOSIS — Z881 Allergy status to other antibiotic agents status: Secondary | ICD-10-CM

## 2022-04-27 DIAGNOSIS — Z951 Presence of aortocoronary bypass graft: Secondary | ICD-10-CM

## 2022-04-27 DIAGNOSIS — Z955 Presence of coronary angioplasty implant and graft: Secondary | ICD-10-CM

## 2022-04-27 DIAGNOSIS — Z8249 Family history of ischemic heart disease and other diseases of the circulatory system: Secondary | ICD-10-CM

## 2022-04-27 DIAGNOSIS — L299 Pruritus, unspecified: Secondary | ICD-10-CM | POA: Diagnosis present

## 2022-04-27 DIAGNOSIS — F419 Anxiety disorder, unspecified: Secondary | ICD-10-CM | POA: Diagnosis present

## 2022-04-27 DIAGNOSIS — R6889 Other general symptoms and signs: Secondary | ICD-10-CM | POA: Diagnosis present

## 2022-04-27 DIAGNOSIS — N1832 Chronic kidney disease, stage 3b: Secondary | ICD-10-CM | POA: Diagnosis present

## 2022-04-27 DIAGNOSIS — Z85118 Personal history of other malignant neoplasm of bronchus and lung: Secondary | ICD-10-CM

## 2022-04-27 DIAGNOSIS — Z87891 Personal history of nicotine dependence: Secondary | ICD-10-CM

## 2022-04-27 DIAGNOSIS — Z888 Allergy status to other drugs, medicaments and biological substances status: Secondary | ICD-10-CM

## 2022-04-27 DIAGNOSIS — J440 Chronic obstructive pulmonary disease with acute lower respiratory infection: Secondary | ICD-10-CM | POA: Diagnosis present

## 2022-04-27 DIAGNOSIS — Z923 Personal history of irradiation: Secondary | ICD-10-CM | POA: Diagnosis not present

## 2022-04-27 DIAGNOSIS — W19XXXA Unspecified fall, initial encounter: Secondary | ICD-10-CM | POA: Diagnosis present

## 2022-04-27 DIAGNOSIS — M545 Low back pain, unspecified: Secondary | ICD-10-CM | POA: Diagnosis present

## 2022-04-27 DIAGNOSIS — Z79899 Other long term (current) drug therapy: Secondary | ICD-10-CM | POA: Diagnosis not present

## 2022-04-27 DIAGNOSIS — J90 Pleural effusion, not elsewhere classified: Secondary | ICD-10-CM | POA: Diagnosis not present

## 2022-04-27 DIAGNOSIS — S0003XA Contusion of scalp, initial encounter: Secondary | ICD-10-CM | POA: Diagnosis present

## 2022-04-27 DIAGNOSIS — I6381 Other cerebral infarction due to occlusion or stenosis of small artery: Secondary | ICD-10-CM | POA: Diagnosis not present

## 2022-04-27 DIAGNOSIS — Z86711 Personal history of pulmonary embolism: Secondary | ICD-10-CM | POA: Diagnosis not present

## 2022-04-27 DIAGNOSIS — I509 Heart failure, unspecified: Secondary | ICD-10-CM | POA: Diagnosis not present

## 2022-04-27 DIAGNOSIS — R0602 Shortness of breath: Secondary | ICD-10-CM | POA: Diagnosis not present

## 2022-04-27 DIAGNOSIS — Z91040 Latex allergy status: Secondary | ICD-10-CM

## 2022-04-27 DIAGNOSIS — Z95828 Presence of other vascular implants and grafts: Secondary | ICD-10-CM

## 2022-04-27 DIAGNOSIS — M069 Rheumatoid arthritis, unspecified: Secondary | ICD-10-CM | POA: Diagnosis present

## 2022-04-27 DIAGNOSIS — R296 Repeated falls: Secondary | ICD-10-CM | POA: Diagnosis present

## 2022-04-27 DIAGNOSIS — Z961 Presence of intraocular lens: Secondary | ICD-10-CM | POA: Diagnosis present

## 2022-04-27 DIAGNOSIS — G8929 Other chronic pain: Secondary | ICD-10-CM | POA: Diagnosis present

## 2022-04-27 DIAGNOSIS — Z9841 Cataract extraction status, right eye: Secondary | ICD-10-CM

## 2022-04-27 DIAGNOSIS — F0393 Unspecified dementia, unspecified severity, with mood disturbance: Secondary | ICD-10-CM | POA: Diagnosis present

## 2022-04-27 DIAGNOSIS — I13 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 failure and stage 1 through stage 4 chronic kidney disease, or unspecified chronic kidney disease: Principal | ICD-10-CM | POA: Diagnosis present

## 2022-04-27 DIAGNOSIS — J9610 Chronic respiratory failure, unspecified whether with hypoxia or hypercapnia: Secondary | ICD-10-CM | POA: Diagnosis present

## 2022-04-27 DIAGNOSIS — Z947 Corneal transplant status: Secondary | ICD-10-CM

## 2022-04-27 DIAGNOSIS — Z1152 Encounter for screening for COVID-19: Secondary | ICD-10-CM | POA: Diagnosis not present

## 2022-04-27 DIAGNOSIS — G4700 Insomnia, unspecified: Secondary | ICD-10-CM | POA: Diagnosis not present

## 2022-04-27 LAB — BASIC METABOLIC PANEL
Anion gap: 9 (ref 5–15)
BUN: 24 mg/dL — ABNORMAL HIGH (ref 8–23)
CO2: 20 mmol/L — ABNORMAL LOW (ref 22–32)
Calcium: 8.7 mg/dL — ABNORMAL LOW (ref 8.9–10.3)
Chloride: 103 mmol/L (ref 98–111)
Creatinine, Ser: 1.48 mg/dL — ABNORMAL HIGH (ref 0.61–1.24)
GFR, Estimated: 44 mL/min — ABNORMAL LOW (ref >60)
Glucose, Bld: 103 mg/dL — ABNORMAL HIGH (ref 70–99)
Potassium: 4.1 mmol/L (ref 3.5–5.1)
Sodium: 132 mmol/L — ABNORMAL LOW (ref 135–145)

## 2022-04-27 LAB — CBC WITH DIFFERENTIAL/PLATELET
Abs Immature Granulocytes: 0.03 10*3/uL (ref 0.00–0.07)
Basophils Absolute: 0 10*3/uL (ref 0.0–0.1)
Basophils Relative: 1 %
Eosinophils Absolute: 0.1 10*3/uL (ref 0.0–0.5)
Eosinophils Relative: 1 %
HCT: 26.7 % — ABNORMAL LOW (ref 39.0–52.0)
Hemoglobin: 8.6 g/dL — ABNORMAL LOW (ref 13.0–17.0)
Immature Granulocytes: 1 %
Lymphocytes Relative: 25 %
Lymphs Abs: 1.4 10*3/uL (ref 0.7–4.0)
MCH: 30.6 pg (ref 26.0–34.0)
MCHC: 32.2 g/dL (ref 30.0–36.0)
MCV: 95 fL (ref 80.0–100.0)
Monocytes Absolute: 0.6 10*3/uL (ref 0.1–1.0)
Monocytes Relative: 11 %
Neutro Abs: 3.6 10*3/uL (ref 1.7–7.7)
Neutrophils Relative %: 61 %
Platelets: 115 10*3/uL — ABNORMAL LOW (ref 150–400)
RBC: 2.81 MIL/uL — ABNORMAL LOW (ref 4.22–5.81)
RDW: 14.4 % (ref 11.5–15.5)
WBC: 5.8 10*3/uL (ref 4.0–10.5)
nRBC: 0 % (ref 0.0–0.2)

## 2022-04-27 LAB — TROPONIN I (HIGH SENSITIVITY)
Troponin I (High Sensitivity): 16 ng/L (ref <18)
Troponin I (High Sensitivity): 19 ng/L — ABNORMAL HIGH (ref <18)

## 2022-04-27 LAB — BRAIN NATRIURETIC PEPTIDE: B Natriuretic Peptide: 683.2 pg/mL — ABNORMAL HIGH (ref 0.0–100.0)

## 2022-04-27 NOTE — ED Triage Notes (Signed)
Pt c/o cp and sob x 2 days intermittently; family endorses body going into jerking motion, unable to sleep due to this; denies hx same; denies N/V; central cp, pressure, no raditaion

## 2022-04-27 NOTE — ED Provider Triage Note (Signed)
Emergency Medicine Provider Triage Evaluation Note  Evan [name] , a 89 y.o. male  was evaluated in triage.  Pt complains of worsening intermittent chest pain, shortness of breath, increased secretions, and cough over the last few days.  Hx of asthma, CAD, COPD, CHF, and adenocarcinoma of right lung.  Occasionally also with spells of jerking motion that interrupts or affects his sleep.  Chest pain nonradiating.  Hx of tremor right lower extremity at baseline.  Denies fever, nausea, vomiting, diarrhea, abdominal pain.  Unknown recent sick contacts.  Review of Systems  Positive:  Negative: See above  Physical Exam  BP 115/82   Pulse (!) 55   Temp (!) 97.5 F (36.4 C) (Axillary)   Resp 18   SpO2 100%  Gen:   Awake, no distress   Resp:  Normal effort, no rales or rhonchi MSK:   Sitting in wheelchair, gait deferred for safety of patient Other:  Chest nonTTP.    Medical Decision Making  Medically screening exam initiated at 4:34 PM.  Appropriate orders placed.  Evan [name] was informed that the remainder of the evaluation will be completed by another provider, this initial triage assessment does not replace that evaluation, and the importance of remaining in the ED until their evaluation is complete.     Evan Rome, PA-C [id number]/81/[id number]

## 2022-04-28 ENCOUNTER — Emergency Department (HOSPITAL_COMMUNITY): Payer: Medicare HMO

## 2022-04-28 ENCOUNTER — Other Ambulatory Visit: Payer: Self-pay

## 2022-04-28 ENCOUNTER — Other Ambulatory Visit (HOSPITAL_COMMUNITY): Payer: Medicare HMO

## 2022-04-28 ENCOUNTER — Telehealth: Payer: Self-pay | Admitting: Interventional Cardiology

## 2022-04-28 ENCOUNTER — Inpatient Hospital Stay (HOSPITAL_COMMUNITY): Payer: Medicare HMO

## 2022-04-28 DIAGNOSIS — L89151 Pressure ulcer of sacral region, stage 1: Secondary | ICD-10-CM | POA: Diagnosis present

## 2022-04-28 DIAGNOSIS — I2699 Other pulmonary embolism without acute cor pulmonale: Secondary | ICD-10-CM | POA: Diagnosis present

## 2022-04-28 DIAGNOSIS — R7989 Other specified abnormal findings of blood chemistry: Secondary | ICD-10-CM

## 2022-04-28 DIAGNOSIS — J810 Acute pulmonary edema: Secondary | ICD-10-CM | POA: Diagnosis present

## 2022-04-28 DIAGNOSIS — E785 Hyperlipidemia, unspecified: Secondary | ICD-10-CM | POA: Diagnosis present

## 2022-04-28 DIAGNOSIS — J9601 Acute respiratory failure with hypoxia: Secondary | ICD-10-CM | POA: Diagnosis present

## 2022-04-28 DIAGNOSIS — I4819 Other persistent atrial fibrillation: Secondary | ICD-10-CM

## 2022-04-28 DIAGNOSIS — Z86711 Personal history of pulmonary embolism: Secondary | ICD-10-CM

## 2022-04-28 DIAGNOSIS — I5021 Acute systolic (congestive) heart failure: Secondary | ICD-10-CM

## 2022-04-28 DIAGNOSIS — Z7901 Long term (current) use of anticoagulants: Secondary | ICD-10-CM | POA: Diagnosis not present

## 2022-04-28 DIAGNOSIS — Z95828 Presence of other vascular implants and grafts: Secondary | ICD-10-CM | POA: Diagnosis not present

## 2022-04-28 DIAGNOSIS — J440 Chronic obstructive pulmonary disease with acute lower respiratory infection: Secondary | ICD-10-CM | POA: Diagnosis present

## 2022-04-28 DIAGNOSIS — W19XXXA Unspecified fall, initial encounter: Secondary | ICD-10-CM | POA: Diagnosis present

## 2022-04-28 DIAGNOSIS — J9610 Chronic respiratory failure, unspecified whether with hypoxia or hypercapnia: Secondary | ICD-10-CM | POA: Diagnosis present

## 2022-04-28 DIAGNOSIS — D509 Iron deficiency anemia, unspecified: Secondary | ICD-10-CM | POA: Diagnosis present

## 2022-04-28 DIAGNOSIS — Z923 Personal history of irradiation: Secondary | ICD-10-CM | POA: Diagnosis not present

## 2022-04-28 DIAGNOSIS — F0393 Unspecified dementia, unspecified severity, with mood disturbance: Secondary | ICD-10-CM | POA: Diagnosis present

## 2022-04-28 DIAGNOSIS — F32A Depression, unspecified: Secondary | ICD-10-CM | POA: Diagnosis present

## 2022-04-28 DIAGNOSIS — M069 Rheumatoid arthritis, unspecified: Secondary | ICD-10-CM | POA: Diagnosis present

## 2022-04-28 DIAGNOSIS — N401 Enlarged prostate with lower urinary tract symptoms: Secondary | ICD-10-CM

## 2022-04-28 DIAGNOSIS — I5020 Unspecified systolic (congestive) heart failure: Secondary | ICD-10-CM | POA: Diagnosis not present

## 2022-04-28 DIAGNOSIS — N400 Enlarged prostate without lower urinary tract symptoms: Secondary | ICD-10-CM | POA: Diagnosis present

## 2022-04-28 DIAGNOSIS — I69251 Hemiplegia and hemiparesis following other nontraumatic intracranial hemorrhage affecting right dominant side: Secondary | ICD-10-CM | POA: Diagnosis not present

## 2022-04-28 DIAGNOSIS — I13 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 failure and stage 1 through stage 4 chronic kidney disease, or unspecified chronic kidney disease: Secondary | ICD-10-CM | POA: Diagnosis present

## 2022-04-28 DIAGNOSIS — J189 Pneumonia, unspecified organism: Secondary | ICD-10-CM | POA: Diagnosis present

## 2022-04-28 DIAGNOSIS — I5023 Acute on chronic systolic (congestive) heart failure: Secondary | ICD-10-CM | POA: Diagnosis present

## 2022-04-28 DIAGNOSIS — E876 Hypokalemia: Secondary | ICD-10-CM | POA: Diagnosis present

## 2022-04-28 DIAGNOSIS — Z79899 Other long term (current) drug therapy: Secondary | ICD-10-CM | POA: Diagnosis not present

## 2022-04-28 DIAGNOSIS — Z96653 Presence of artificial knee joint, bilateral: Secondary | ICD-10-CM | POA: Diagnosis present

## 2022-04-28 DIAGNOSIS — Z951 Presence of aortocoronary bypass graft: Secondary | ICD-10-CM | POA: Diagnosis not present

## 2022-04-28 DIAGNOSIS — Z1152 Encounter for screening for COVID-19: Secondary | ICD-10-CM | POA: Diagnosis not present

## 2022-04-28 DIAGNOSIS — N1832 Chronic kidney disease, stage 3b: Secondary | ICD-10-CM | POA: Diagnosis present

## 2022-04-28 LAB — URINALYSIS, ROUTINE W REFLEX MICROSCOPIC
Bilirubin Urine: NEGATIVE
Glucose, UA: NEGATIVE mg/dL
Hgb urine dipstick: NEGATIVE
Ketones, ur: NEGATIVE mg/dL
Leukocytes,Ua: NEGATIVE
Nitrite: NEGATIVE
Protein, ur: NEGATIVE mg/dL
Specific Gravity, Urine: 1.01 (ref 1.005–1.030)
pH: 6 (ref 5.0–8.0)

## 2022-04-28 LAB — HEPARIN LEVEL (UNFRACTIONATED): Heparin Unfractionated: >1.1 IU/mL — ABNORMAL HIGH (ref 0.30–0.70)

## 2022-04-28 LAB — RESP PANEL BY RT-PCR (RSV, FLU A&B, COVID)  RVPGX2
Influenza A by PCR: NEGATIVE
Influenza B by PCR: NEGATIVE
Resp Syncytial Virus by PCR: NEGATIVE
SARS Coronavirus 2 by RT PCR: NEGATIVE

## 2022-04-28 LAB — ECHOCARDIOGRAM LIMITED
Area-P 1/2: 6.32 cm2
Height: 63 in
S' Lateral: 3 cm
Weight: 2720 oz

## 2022-04-28 LAB — APTT: aPTT: >200 seconds (ref 24–36)

## 2022-04-28 MED ORDER — ZINC OXIDE 12.8 % EX OINT
TOPICAL_OINTMENT | Freq: Four times a day (QID) | CUTANEOUS | Status: DC | PRN
  Filled 2022-04-28: qty 56.7

## 2022-04-28 MED ORDER — IPRATROPIUM-ALBUTEROL 0.5-2.5 (3) MG/3ML IN SOLN
3.0000 mL | Freq: Once | RESPIRATORY_TRACT | Status: AC
  Administered 2022-04-28: 3 mL via RESPIRATORY_TRACT
  Filled 2022-04-28: qty 3

## 2022-04-28 MED ORDER — LIFITEGRAST 5 % OP SOLN: 1.0000 [drp] | Freq: Two times a day (BID) | OPHTHALMIC | Status: DC | PRN

## 2022-04-28 MED ORDER — HEPARIN (PORCINE) 25000 UT/250ML-% IV SOLN: 1000.0000 [IU]/h | INTRAVENOUS | Status: DC

## 2022-04-28 MED ORDER — TAMSULOSIN HCL 0.4 MG PO CAPS
0.4000 mg | ORAL_CAPSULE | Freq: Every day | ORAL | Status: DC
  Administered 2022-04-28 – 2022-05-03 (×6): 0.4 mg via ORAL
  Filled 2022-04-28 (×6): qty 1

## 2022-04-28 MED ORDER — FUROSEMIDE 10 MG/ML IJ SOLN
40.0000 mg | Freq: Two times a day (BID) | INTRAMUSCULAR | Status: DC
  Administered 2022-04-28 – 2022-04-29 (×3): 40 mg via INTRAVENOUS
  Filled 2022-04-28 (×3): qty 4

## 2022-04-28 MED ORDER — FUROSEMIDE 10 MG/ML IJ SOLN
40.0000 mg | Freq: Once | INTRAMUSCULAR | Status: AC
  Administered 2022-04-28: 40 mg via INTRAVENOUS
  Filled 2022-04-28: qty 4

## 2022-04-28 MED ORDER — ROSUVASTATIN CALCIUM 5 MG PO TABS
5.0000 mg | ORAL_TABLET | Freq: Every day | ORAL | Status: DC
  Administered 2022-04-28 – 2022-05-03 (×6): 5 mg via ORAL
  Filled 2022-04-28 (×6): qty 1

## 2022-04-28 MED ORDER — IOHEXOL 350 MG/ML SOLN
75.0000 mL | Freq: Once | INTRAVENOUS | Status: AC | PRN
  Administered 2022-04-28: 75 mL via INTRAVENOUS

## 2022-04-28 MED ORDER — ACETAMINOPHEN 325 MG PO TABS
650.0000 mg | ORAL_TABLET | Freq: Four times a day (QID) | ORAL | Status: DC | PRN
  Administered 2022-04-28 – 2022-05-01 (×3): 650 mg via ORAL
  Filled 2022-04-28 (×3): qty 2

## 2022-04-28 MED ORDER — ACETAMINOPHEN 650 MG RE SUPP: 650.0000 mg | Freq: Four times a day (QID) | RECTAL | Status: DC | PRN

## 2022-04-28 MED ORDER — HEPARIN (PORCINE) 25000 UT/250ML-% IV SOLN
1250.0000 [IU]/h | INTRAVENOUS | Status: DC
  Administered 2022-04-28: 1250 [IU]/h via INTRAVENOUS
  Filled 2022-04-28: qty 250

## 2022-04-28 MED ORDER — ALBUTEROL SULFATE (2.5 MG/3ML) 0.083% IN NEBU: 2.5000 mg | INHALATION_SOLUTION | RESPIRATORY_TRACT | Status: DC | PRN

## 2022-04-28 MED ORDER — SODIUM CHLORIDE 0.9% FLUSH
3.0000 mL | Freq: Two times a day (BID) | INTRAVENOUS | Status: DC
  Administered 2022-04-28 – 2022-05-03 (×10): 3 mL via INTRAVENOUS

## 2022-04-28 MED ORDER — FINASTERIDE 5 MG PO TABS
5.0000 mg | ORAL_TABLET | Freq: Every day | ORAL | Status: DC
  Administered 2022-04-28 – 2022-05-03 (×6): 5 mg via ORAL
  Filled 2022-04-28 (×6): qty 1

## 2022-04-28 MED ORDER — FLUOXETINE HCL 20 MG PO CAPS
20.0000 mg | ORAL_CAPSULE | Freq: Every morning | ORAL | Status: DC
  Administered 2022-04-29 – 2022-05-03 (×5): 20 mg via ORAL
  Filled 2022-04-28 (×5): qty 1

## 2022-04-28 NOTE — Progress Notes (Signed)
ANTICOAGULATION CONSULT NOTE - Initial Consult  Pharmacy Consult for Heparin infusion  Indication: pulmonary embolus  Allergies  Allergen Reactions   Feldene [Piroxicam] Rash    Blistering rash   Neomycin-Polymyxin-Hc Itching   Statins Other (See Comments)    Myalgias   Valium [Diazepam] Other (See Comments)    Dizziness    Latex Rash   Tequin [Gatifloxacin] Anxiety   Vibra-Tab [Doxycycline] Nausea Only    Patient Measurements: Height: 5\' 3"  (160 cm) Weight: 77.1 kg (170 lb) IBW/kg (Calculated) : 56.9 Heparin Dosing Weight: 72.9 kg  Vital Signs: Temp: 98.1 F (36.7 C) (01/25 0740) Temp Source: Oral (01/25 0740) BP: 119/94 (01/25 0730) Pulse Rate: 68 (01/25 0730)  Labs: Recent Labs    04/27/22 1643 04/27/22 1829  HGB 8.6*  --   HCT 26.7*  --   PLT 115*  --   CREATININE 1.48*  --   TROPONINIHS 16 19*    Estimated Creatinine Clearance: 29.3 mL/min (A) (by C-G formula based on SCr of 1.48 mg/dL (H)).   Medical History: Past Medical History:  Diagnosis Date   Adenocarcinoma of right lung ([place]) 01/06/2016   Anginal chest pain at rest    Chronic non, controlled on Lorazepam   Anxiety    Arthritis    "all over"    BPH (benign prostatic hyperplasia)    CAD (coronary artery disease)    Chronic lower back pain    Complication of anesthesia    "problems making water afterwards"   Depression    DJD (degenerative joint disease)    Esophageal ulcer without bleeding    bid ppi indefinitely   Family history of adverse reaction to anesthesia    "children w/PONV"   GERD (gastroesophageal reflux disease)    Headache    "couple/week maybe" (09/04/2015)   Hemochromatosis    Possible, elevated iron stores, hook-like osteophytes on hand films, normal LFTs   Hepatitis    "yellow jaundice as a baby"   History of ASCVD    MULTIVESSEL   Hyperlipidemia    Hypertension    Mild aortic stenosis 06/23/2021   Osteoarthritis    Pneumonia    "several times; got a little now"  (09/04/2015)   Rheumatoid arthritis ([place])    Subdural hematoma ([place])    small after fall 09/2016-plavix held, neurosurgery consulted.  Asymptomatic.     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 Last dose of rivaroxaban was 04/27/22 evening. CT PE positive for small acute PE in subsegmental pulomonary artery of RLL with evidence of chronic right heart strain. CT findings also consistent with pulmonary arterial hypertension. Pharmacy consulted to initiate and dose heparin for acute PE.   Hgb 8.6, Plt 115 No s/sx of bleeding  Goal of Therapy:  Heparin level 0.3-0.7 units/ml aPTT 66-102 seconds Monitor platelets by anticoagulation protocol: Yes   Plan:  Initiate heparin infusion at 1250 units/hr No bolus given recent rivaroxaban administration Check aPTT and heparin level in 8 hours Monitor daily CBC, aPTT and heparin levels (until correlating due to recent rivaroxaban administration), and for s/sx of bleeding F/u oral AC plan  Note: rivaroxaban 15mg  daily dose was appropriate for Afib based on pt's renal function, but appropriate dose for VTE is 20mg  daily with food. It is unclear why dose was  reduced to 15mg  daily. Pt was previously on enoxaparin lifelong after failing apixaban per notes from 08/2018.   Luisa Hart, PharmD, BCPS Clinical Pharmacist 04/28/2022 9:40 AM   Please refer to [facility] for pharmacy phone number

## 2022-04-28 NOTE — ED Notes (Signed)
This NT applied barrier cream to this Evan [name] for redness and burning to buttox. Evan [name] says it feels a little better now that the cream has been applied.

## 2022-04-28 NOTE — ED Notes (Signed)
Foam dressing placed on patient's sacrum at this time.

## 2022-04-28 NOTE — Progress Notes (Signed)
  Echocardiogram 2D Echocardiogram has been performed.  Evan [name] 04/28/2022, 5:10 PM

## 2022-04-28 NOTE — Progress Notes (Signed)
Heart Failure Navigator Progress Note  Assessed for Heart & Vascular TOC clinic readiness.  Patient does not meet criteria due to poor mobility status at baseline.   Navigator will sign off t this time.    Earnestine Leys, BSN, Clinical cytogeneticist Only

## 2022-04-28 NOTE — Progress Notes (Signed)
ANTICOAGULATION CONSULT NOTE - Initial Consult  Pharmacy Consult for Heparin infusion  Indication: pulmonary embolus  Allergies  Allergen Reactions   Feldene [Piroxicam] Rash    Blistering rash   Neomycin-Polymyxin-Hc Itching   Statins Other (See Comments)    Myalgias   Valium [Diazepam] Other (See Comments)    Dizziness    Latex Rash   Tequin [Gatifloxacin] Anxiety   Vibra-Tab [Doxycycline] Nausea Only    Patient Measurements: Height: 5\' 3"  (160 cm) Weight: 77.1 kg (170 lb) IBW/kg (Calculated) : 56.9 Heparin Dosing Weight: 72.9 kg  Vital Signs: Temp: 97.9 F (36.6 C) (01/25 2029) Temp Source: Oral (01/25 2029) BP: 105/53 (01/25 2029) Pulse Rate: 56 (01/25 2029)  Labs: Recent Labs    04/27/22 1643 04/27/22 1829 04/28/22 1808  HGB 8.6*  --   --   HCT 26.7*  --   --   PLT 115*  --   --   APTT  --   --  >200*  HEPARINUNFRC  --   --  >1.10*  CREATININE 1.48*  --   --   TROPONINIHS 16 19*  --      Estimated Creatinine Clearance: 29.3 mL/min (A) (by C-G formula based on SCr of 1.48 mg/dL (H)).   Medical History: Past Medical History:  Diagnosis Date   Adenocarcinoma of right lung ([place]) 01/06/2016   Anginal chest pain at rest    Chronic non, controlled on Lorazepam   Anxiety    Arthritis    "all over"    BPH (benign prostatic hyperplasia)    CAD (coronary artery disease)    Chronic lower back pain    Complication of anesthesia    "problems making water afterwards"   Depression    DJD (degenerative joint disease)    Esophageal ulcer without bleeding    bid ppi indefinitely   Family history of adverse reaction to anesthesia    "children w/PONV"   GERD (gastroesophageal reflux disease)    Headache    "couple/week maybe" (09/04/2015)   Hemochromatosis    Possible, elevated iron stores, hook-like osteophytes on hand films, normal LFTs   Hepatitis    "yellow jaundice as a baby"   History of ASCVD    MULTIVESSEL   Hyperlipidemia    Hypertension    Mild  aortic stenosis 06/23/2021   Osteoarthritis    Pneumonia    "several times; got a little now" (09/04/2015)   Rheumatoid arthritis ([place])    Subdural hematoma ([place])    small after fall 09/2016-plavix held, neurosurgery consulted.  Asymptomatic.     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   04/27/2022   Cholecalciferol (VITAMIN D-3 PO) Take 1 capsule by mouth in the morning.   04/27/2022   finasteride (PROSCAR) 5 MG tablet Take 1 tablet (5 mg total) by mouth daily. 90 tablet 3 04/27/2022   FLUoxetine (PROZAC) 20 MG capsule TAKE 1 CAPSULE EVERY DAY (Patient taking differently: Take 20 mg by mouth in the morning.) 90 capsule 0 04/27/2022   furosemide (LASIX) 20 MG tablet Take 1 tablet (20 mg total) by mouth daily. (Patient taking differently: Take 20 mg by mouth in the morning.) 90 tablet 3 04/27/2022   Lifitegrast (XIIDRA) 5 %  SOLN Place 1 drop into both eyes 2 (two) times daily. (Patient taking differently: Place 1 drop into both eyes 2 (two) times daily as needed (dry eyes).) 60 each 12 Past Month   metoprolol tartrate (LOPRESSOR) 25 MG tablet TAKE 1/2 TABLET TWICE DAILY (Patient taking differently: Take 12.5 mg by mouth daily.) 90 tablet 3 04/27/2022 at 0800   nitroGLYCERIN (NITROSTAT) 0.4 MG SL tablet Place 1 tablet (0.4 mg total) under the tongue every 5 (five) minutes as needed for chest pain. 25 tablet 2 UNK   pantoprazole (PROTONIX) 40 MG tablet Take 1 tablet (40 mg total) by mouth in the morning. 90 tablet 0 04/27/2022   polyethylene glycol (MIRALAX / GLYCOLAX) 17 g packet Take 17 g by mouth daily as needed for mild constipation. 14 each 0 Past Month   rosuvastatin (CRESTOR) 5 MG tablet TAKE 1 TABLET EVERY DAY (Patient taking  differently: Take 5 mg by mouth daily with supper.) 90 tablet 1 04/26/2022   tamsulosin (FLOMAX) 0.4 MG CAPS capsule TAKE 1 CAPSULE EVERY DAY (Patient taking differently: Take 0.4 mg by mouth daily with supper.) 90 capsule 10 04/26/2022   XARELTO 15 MG TABS tablet TAKE 1 TABLET EVERY DAY WITH SUPPER (Patient taking differently: Take 15 mg by mouth daily with supper.) 30 tablet 10 04/26/2022 at 1800   ZINC OXIDE EX Apply 1 application  topically 4 (four) times daily as needed (skin barrier).   Past Month    Assessment: 89 yo M with history of atrial fibrillation and VTE on rivaroxaban 15mg  every evening with dinner. Last dose of rivaroxaban was 04/27/22 evening. CT PE positive for small acute PE in subsegmental pulomonary artery of RLL with evidence of chronic right heart strain. CT findings also consistent with pulmonary arterial hypertension. Pharmacy consulted to initiate and dose heparin for acute PE.   Hgb 8.6, Plt 115 No s/sx of bleeding  Both heparin and PTT came back elevated tonight. Levels were drawn correctly. No bleeding per Rn. We will hold and reduce rate. Check level in AM.   Goal of Therapy:  Heparin level 0.3-0.7 units/ml aPTT 66-102 seconds Monitor platelets by anticoagulation protocol: Yes   Plan:  Hold heparin x2 hrs Decrease heparin infusion 1000 units/hr Check aPTT and heparin level in AM Monitor daily CBC, aPTT and heparin levels (until correlating due to recent rivaroxaban administration), and for s/sx of bleeding F/u longterm [facility] plan  Onnie Boer, PharmD, BCIDP, AAHIVP, CPP Infectious Disease Pharmacist 04/28/2022 8:44 PM

## 2022-04-28 NOTE — ED Notes (Signed)
ED TO INPATIENT HANDOFF REPORT  ED Nurse Name and Phone #: [place], [place] [address]857  S Name/Age/Gender Evan [name] 89 y.o. male Room/Bed: 005C/005C  Code Status   Code Status: Full Code  Home/SNF/Other Home Patient oriented to: self, place, and situation Is this baseline? Yes   Triage Complete: Triage complete  Chief Complaint Acute respiratory failure with hypoxia ([place]) [J96.01]  Triage Note Pt c/o cp and sob x 2 days intermittently; family endorses body going into jerking motion, unable to sleep due to this; denies hx same; denies N/V; central cp, pressure, no raditaion   Allergies Allergies  Allergen Reactions   Feldene [Piroxicam] Rash    Blistering rash   Neomycin-Polymyxin-Hc Itching   Statins Other (See Comments)    Myalgias   Valium [Diazepam] Other (See Comments)    Dizziness    Latex Rash   Tequin [Gatifloxacin] Anxiety   Vibra-Tab [Doxycycline] Nausea Only    Level of Care/Admitting Diagnosis ED Disposition     ED Disposition  Admit   Condition  --   [facility] Area: [place] [[id number]]  Level of Care: Telemetry Cardiac [[id number]]  May admit patient to [facility] or [facility] if equivalent level of care is available:: Yes  Covid Evaluation: Confirmed COVID Negative  Diagnosis: Acute respiratory failure with hypoxia [facility]) [[id number]]  Admitting Physician: Vianne Bulls [[id number]]  Attending Physician: Vianne Bulls [[id number]]  Certification:: I certify this patient will need inpatient services for at least 2 midnights  Estimated Length of Stay: 3          B Medical/Surgery History Past Medical History:  Diagnosis Date   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   Anginal chest pain at rest    Chronic non, controlled on Lorazepam   Anxiety    Arthritis    "all over"    BPH (benign prostatic hyperplasia)    CAD (coronary artery disease)    Chronic lower back pain    Complication of anesthesia    "problems making water  afterwards"   Depression    DJD (degenerative joint disease)    Esophageal ulcer without bleeding    bid ppi indefinitely   Family history of adverse reaction to anesthesia    "children w/PONV"   GERD (gastroesophageal reflux disease)    Headache    "couple/week maybe" (09/04/2015)   Hemochromatosis    Possible, elevated iron stores, hook-like osteophytes on hand films, normal LFTs   Hepatitis    "yellow jaundice as a baby"   History of ASCVD    MULTIVESSEL   Hyperlipidemia    Hypertension    Mild aortic stenosis 06/23/2021   Osteoarthritis    Pneumonia    "several times; got a little now" (09/04/2015)   Rheumatoid arthritis ([place])    Subdural hematoma ([place])    small after fall 09/2016-plavix held, neurosurgery consulted.  Asymptomatic.     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 since he failed PO Eliquis   Past Surgical History:  Procedure Laterality Date   ARM SKIN LESION BIOPSY / EXCISION Left 09/02/2015   CATARACT EXTRACTION W/ INTRAOCULAR LENS  IMPLANT, BILATERAL Bilateral    CHOLECYSTECTOMY OPEN     CORNEAL TRANSPLANT Bilateral    "one at [facility]; one at [facility]"   [place] W/ STENT     DE stent ostium into the  right radial free graft at OM-, 02-2007   ESOPHAGOGASTRODUODENOSCOPY N/A 11/09/2014   Procedure: ESOPHAGOGASTRODUODENOSCOPY (EGD);  Surgeon: [name], MD;  Location: [facility] ENDOSCOPY;  Service: Endoscopy;  Laterality: N/A;   INGUINAL HERNIA REPAIR     JOINT REPLACEMENT     LEFT HEART CATH AND CORS/GRAFTS ANGIOGRAPHY N/A 06/27/2017   Procedure: LEFT HEART CATH AND CORS/GRAFTS ANGIOGRAPHY;  Surgeon: [name], MD;  Location: [facility] LAB;  Service: Cardiovascular;  Laterality: N/A;   NASAL SINUS SURGERY     SHOULDER OPEN ROTATOR CUFF REPAIR Right    TOTAL KNEE ARTHROPLASTY Bilateral      A IV Location/Drains/Wounds Patient  Lines/Drains/Airways Status     Active Line/Drains/Airways     Name Placement date Placement time Site Days   Peripheral IV 04/28/22 20 G Left Forearm 04/28/22  0151  Forearm  less than 1   Peripheral IV 04/28/22 20 G Anterior;Right Forearm 04/28/22  0939  Forearm  less than 1   Pressure Injury 08/26/18 Stage II -  Partial thickness loss of dermis presenting as a shallow open ulcer with a red, pink wound bed without slough. small circular wounds on both buttocks, 2 on right, 1 on left 08/26/18  2020  -- 1341   Pressure Injury 03/12/22 Coccyx Mid Stage 1 -  Intact skin with non-blanchable redness of a localized area usually over a bony prominence. 03/12/22  1800  -- 47   Wound / Incision (Open or Dehisced) 08/12/18 Other (Comment) Heel Posterior;Right Deep Tissue Injurry 08/12/18  0930  Heel  1355   Wound / Incision (Open or Dehisced) 08/12/18 Other (Comment) Ear Right Device Associated Pressure Ulcer  08/12/18  1640  Ear  1355            Intake/Output Last 24 hours No intake or output data in the 24 hours ending 04/28/22 1737  Labs/Imaging Results for orders placed or performed during the hospital encounter of 04/27/22 (from the past 48 hour(s))  Basic metabolic panel     Status: Abnormal   Collection Time: 04/27/22  4:43 PM  Result Value Ref Range   Sodium 132 (L) 135 - 145 mmol/L   Potassium 4.1 3.5 - 5.1 mmol/L   Chloride 103 98 - 111 mmol/L   CO2 20 (L) 22 - 32 mmol/L   Glucose, Bld 103 (H) 70 - 99 mg/dL    Comment: Glucose reference range applies only to samples taken after fasting for at least 8 hours.   BUN 24 (H) 8 - 23 mg/dL   Creatinine, Ser 1.48 (H) 0.61 - 1.24 mg/dL   Calcium 8.7 (L) 8.9 - 10.3 mg/dL   GFR, Estimated 44 (L) >60 mL/min    Comment: (NOTE) Calculated using the CKD-EPI Creatinine Equation (2021)    Anion gap 9 5 - 15    Comment: Performed at [facility] [address], [place], [place] [address]  Troponin I (High Sensitivity)     Status: None    Collection Time: 04/27/22  4:43 PM  Result Value Ref Range   Troponin I (High Sensitivity) 16 <18 ng/L    Comment: (NOTE) Elevated high sensitivity troponin I (hsTnI) values and significant  changes across serial measurements may suggest ACS but many other  chronic and acute conditions are known to elevate hsTnI results.  Refer to the "Links" section for chest pain algorithms and additional  guidance. Performed at [facility], [place] [address], [place], [place] [address]   CBC  with Differential     Status: Abnormal   Collection Time: 04/27/22  4:43 PM  Result Value Ref Range   WBC 5.8 4.0 - 10.5 K/uL   RBC 2.81 (L) 4.22 - 5.81 MIL/uL   Hemoglobin 8.6 (L) 13.0 - 17.0 g/dL   HCT 26.7 (L) 39.0 - 52.0 %   MCV 95.0 80.0 - 100.0 fL   MCH 30.6 26.0 - 34.0 pg   MCHC 32.2 30.0 - 36.0 g/dL   RDW 14.4 11.5 - 15.5 %   Platelets 115 (L) 150 - 400 K/uL   nRBC 0.0 0.0 - 0.2 %   Neutrophils Relative % 61 %   Neutro Abs 3.6 1.7 - 7.7 K/uL   Lymphocytes Relative 25 %   Lymphs Abs 1.4 0.7 - 4.0 K/uL   Monocytes Relative 11 %   Monocytes Absolute 0.6 0.1 - 1.0 K/uL   Eosinophils Relative 1 %   Eosinophils Absolute 0.1 0.0 - 0.5 K/uL   Basophils Relative 1 %   Basophils Absolute 0.0 0.0 - 0.1 K/uL   Immature Granulocytes 1 %   Abs Immature Granulocytes 0.03 0.00 - 0.07 K/uL    Comment: Performed at [facility], [address] [place], [place] [address]  Brain natriuretic peptide     Status: Abnormal   Collection Time: 04/27/22  4:43 PM  Result Value Ref Range   B Natriuretic Peptide 683.2 (H) 0.0 - 100.0 pg/mL    Comment: Performed at [facility] [address] [place], [place] [address]  Troponin I (High Sensitivity)     Status: Abnormal   Collection Time: 04/27/22  6:29 PM  Result Value Ref Range   Troponin I (High Sensitivity) 19 (H) <18 ng/L    Comment: (NOTE) Elevated high sensitivity troponin I (hsTnI) values and significant  changes across serial  measurements may suggest ACS but many other  chronic and acute conditions are known to elevate hsTnI results.  Refer to the "Links" section for chest pain algorithms and additional  guidance. Performed at [facility], [place] [address] [place], [place] [address]   Resp panel by RT-PCR (RSV, Flu A&B, Covid) Anterior Nasal Swab     Status: None   Collection Time: 04/28/22  1:53 AM   Specimen: Anterior Nasal Swab  Result Value Ref Range   SARS Coronavirus 2 by RT PCR NEGATIVE NEGATIVE    Comment: (NOTE) SARS-CoV-2 target nucleic acids are NOT DETECTED.  The SARS-CoV-2 RNA is generally detectable in upper respiratory specimens during the acute phase of infection. The lowest concentration of SARS-CoV-2 viral copies this assay can detect is 138 copies/mL. A negative result does not preclude SARS-Cov-2 infection and should not be used as the sole basis for treatment or other patient management decisions. A negative result may occur with  improper specimen collection/handling, submission of specimen other than nasopharyngeal swab, presence of viral mutation(s) within the areas targeted by this assay, and inadequate number of viral copies(<138 copies/mL). A negative result must be combined with clinical observations, patient history, and epidemiological information. The expected result is Negative.  Fact Sheet for Patients:  EntrepreneurPulse.com.au  Fact Sheet for Healthcare Providers:  IncredibleEmployment.be  This test is no t yet approved or cleared by the [place] FDA and  has been authorized for detection and/or diagnosis of SARS-CoV-2 by FDA under an Emergency Use Authorization (EUA). This EUA will remain  in effect (meaning this test can be used) for the duration of the COVID-19  declaration under Section 564(b)(1) of the Act, 21 U.S.C.section 360bbb-3(b)(1), unless the authorization is terminated  or revoked sooner.        Influenza A by PCR NEGATIVE NEGATIVE   Influenza B by PCR NEGATIVE NEGATIVE    Comment: (NOTE) The Xpert Xpress SARS-CoV-2/FLU/RSV plus assay is intended as an aid in the diagnosis of influenza from Nasopharyngeal swab specimens and should not be used as a sole basis for treatment. Nasal washings and aspirates are unacceptable for Xpert Xpress SARS-CoV-2/FLU/RSV testing.  Fact Sheet for Patients: EntrepreneurPulse.com.au  Fact Sheet for Healthcare Providers: IncredibleEmployment.be  This test is not yet approved or cleared by the [place] FDA and has been authorized for detection and/or diagnosis of SARS-CoV-2 by FDA under an Emergency Use Authorization (EUA). This EUA will remain in effect (meaning this test can be used) for the duration of the COVID-19 declaration under Section 564(b)(1) of the Act, 21 U.S.C. section 360bbb-3(b)(1), unless the authorization is terminated or revoked.     Resp Syncytial Virus by PCR NEGATIVE NEGATIVE    Comment: (NOTE) Fact Sheet for Patients: EntrepreneurPulse.com.au  Fact Sheet for Healthcare Providers: IncredibleEmployment.be  This test is not yet approved or cleared by the [place] FDA and has been authorized for detection and/or diagnosis of SARS-CoV-2 by FDA under an Emergency Use Authorization (EUA). This EUA will remain in effect (meaning this test can be used) for the duration of the COVID-19 declaration under Section 564(b)(1) of the Act, 21 U.S.C. section 360bbb-3(b)(1), unless the authorization is terminated or revoked.  Performed at [facility], [place] [address] [place], [place] [address]   Urinalysis, Routine w reflex microscopic -Urine, Clean Catch     Status: None   Collection Time: 04/28/22  1:53 AM  Result Value Ref Range   Color, Urine YELLOW YELLOW   APPearance CLEAR CLEAR   Specific Gravity, Urine 1.010 1.005 - 1.030   pH 6.0 5.0  - 8.0   Glucose, UA NEGATIVE NEGATIVE mg/dL   Hgb urine dipstick NEGATIVE NEGATIVE   Bilirubin Urine NEGATIVE NEGATIVE   Ketones, ur NEGATIVE NEGATIVE mg/dL   Protein, ur NEGATIVE NEGATIVE mg/dL   Nitrite NEGATIVE NEGATIVE   Leukocytes,Ua NEGATIVE NEGATIVE    Comment: Performed at [facility] [address], [place], [place] [address]   *Note: Due to a large number of results and/or encounters for the requested time period, some results have not been displayed. A complete set of results can be found in Results Review.   ECHOCARDIOGRAM LIMITED  Result Date: 04/28/2022    ECHOCARDIOGRAM LIMITED REPORT   Patient Name:   TEMITAYO COVALT Date of Exam: 04/28/2022 Medical Rec #:  [id number]    Height:       63.0 in Accession #:    [id number]   Weight:       170.0 lb Date of Birth:  03-18-1928    BSA:          1.805 m Patient Age:    30 years     BP:           124/59 mmHg Patient Gender: M            HR:           60 bpm. Exam Location:  Inpatient Procedure: Limited Echo, Cardiac Doppler and Limited Color Doppler Indications:    CHF-Acute Systolic M57.84  History:        Patient has prior history of Echocardiogram examinations, most  recent 04/13/2021. CAD, Aortic Valve Disease, Arrythmias:Atrial                 Fibrillation, Signs/Symptoms:Shortness of Breath; Risk                 Factors:Non-Smoker, Hypertension and Dyslipidemia.  Sonographer:    Greer Pickerel Referring Phys: [id number] RONDELL A SMITH  Sonographer Comments: Image acquisition challenging due to patient body habitus and Image acquisition challenging due to respiratory motion. IMPRESSIONS  1. Assessing wall motion is challenging. Left ventricular ejection fraction, by estimation, is 50 to 55%. The left ventricle has low normal function.  2. Right ventricular systolic function is mildly reduced. The right ventricular size is moderately enlarged. There is moderately elevated pulmonary artery systolic pressure.  3. Left atrial size  was moderately dilated.  4. Trivial mitral valve regurgitation.  5. Aortic valve regurgitation is not visualized.  6. The inferior vena cava is normal in size with <50% respiratory variability, suggesting right atrial pressure of 8 mmHg. FINDINGS  Left Ventricle: Assessing wall motion is challenging. Left ventricular ejection fraction, by estimation, is 50 to 55%. The left ventricle has low normal function. There is no left ventricular hypertrophy. Right Ventricle: The right ventricular size is moderately enlarged. Right ventricular systolic function is mildly reduced. There is moderately elevated pulmonary artery systolic pressure. The tricuspid regurgitant velocity is 2.93 m/s, and with an assumed right atrial pressure of 15 mmHg, the estimated right ventricular systolic pressure is 54.6 mmHg. Left Atrium: Left atrial size was moderately dilated. Right Atrium: Right atrial size was normal in size. Mitral Valve: Trivial mitral valve regurgitation. Tricuspid Valve: Tricuspid valve regurgitation is mild. Aortic Valve: Aortic valve regurgitation is not visualized. Pulmonic Valve: Pulmonic valve regurgitation is not visualized. Venous: The inferior vena cava is normal in size with less than 50% respiratory variability, suggesting right atrial pressure of 8 mmHg. Additional Comments: Spectral Doppler performed. Color Doppler performed.  LEFT VENTRICLE PLAX 2D LVIDd:         3.80 cm LVIDs:         3.00 cm LV PW:         1.00 cm LV IVS:        0.90 cm  MITRAL VALVE               TRICUSPID VALVE MV Area (PHT): 6.32 cm    TR Peak grad:   34.3 mmHg MV Decel Time: 120 msec    TR Vmax:        293.00 cm/s MV E velocity: 92.20 cm/s MV A velocity: 37.80 cm/s MV E/A ratio:  2.44 Landscape architect signed by Phineas Inches Signature Date/Time: 04/28/2022/5:35:41 PM    Final    CT Angio Chest PE W and/or Wo Contrast  Result Date: 04/28/2022 CLINICAL DATA:  Chest pain, dyspnea EXAM: CT ANGIOGRAPHY CHEST WITH CONTRAST  TECHNIQUE: Multidetector CT imaging of the chest was performed using the standard protocol during bolus administration of intravenous contrast. Multiplanar CT image reconstructions and MIPs were obtained to evaluate the vascular anatomy. RADIATION DOSE REDUCTION: This exam was performed according to the departmental dose-optimization program which includes automated exposure control, adjustment of the mA and/or kV according to patient size and/or use of iterative reconstruction technique. CONTRAST:  75mL OMNIPAQUE IOHEXOL 350 MG/ML SOLN COMPARISON:  None Available. FINDINGS: Cardiovascular: There is adequate opacification of pulmonary arterial tree. There is a single intraluminal filling defect identified within the posterior segmental pulmonary artery of the right lower lobe in  keeping with an acute pulmonary embolus. The overall embolic burden is tiny. The central pulmonary arteries are dilated in keeping with changes of pulmonary arterial hypertension. Coronary artery bypass grafting has been performed. Global cardiac size is mildly enlarged, stable since prior examination. There is mild relative dilation of the right ventricle and right atrium in keeping with elevated right heart pressures as well as prominent reflux of contrast to the hepatic venous system in keeping with some degree of right heart failure. The RV/LV ratio is elevated (RV/LV ratio equal 1.0), however, this appears stable since prior examination and is likely chronic in nature. No pericardial effusion. Thoracic aorta is of normal caliber. Moderate atherosclerotic calcification. Mediastinum/Nodes: Shotty mediastinal adenopathy is nonspecific, possibly reactive in nature. No frankly pathologic thoracic adenopathy. Visualized thyroid unremarkable. Esophagus is unremarkable. Lungs/Pleura: Small bilateral pleural effusions are present. There is superimposed thickening of the peribronchovascular interstitium and scattered basilar and dependent  predominant interstitial and ground-glass pulmonary infiltrates most suggestive of superimposed mild cardiogenic failure. More focal consolidation within the posterior basal right lower lobe may reflect superimposed pulmonary infarct given the previously identified pulmonary embolus. Upper Abdomen: No acute abnormality Musculoskeletal: No lytic or blastic bone lesion. Osseous structures are age-appropriate. Review of the MIP images confirms the above findings. IMPRESSION: 1. Small acute pulmonary embolus within the posterior segmental pulmonary artery of the right lower lobe. The overall embolic burden is tiny. While there is evidence right heart strain, this appears chronic in nature and is stable since prior examination. 2. Morphologic changes in keeping with pulmonary arterial hypertension and elevated right heart pressures. Reflux of contrast into the hepatic venous system in keeping with at least some degree of right heart failure. 3. Mild interstitial and alveolar pulmonary edema and small bilateral pleural effusions in keeping with mild cardiogenic failure. 4. Superimposed focal consolidation within the posterior basal right lower lobe may reflect superimposed pulmonary infarct given the previously identified pulmonary embolus. These results were called by telephone at the time of interpretation on 04/28/2022 at 3:43 am to provider Ripley Fraise , who verbally acknowledged these results. Aortic Atherosclerosis (ICD10-I70.0). Electronically Signed   By: Fidela Salisbury M.D.   On: 04/28/2022 03:43   CT Head Wo Contrast  Result Date: 04/28/2022 CLINICAL DATA:  Evaluation for delirium. EXAM: CT HEAD WITHOUT CONTRAST TECHNIQUE: Contiguous axial images were obtained from the base of the skull through the vertex without intravenous contrast. RADIATION DOSE REDUCTION: This exam was performed according to the departmental dose-optimization program which includes automated exposure control, adjustment of the mA  and/or kV according to patient size and/or use of iterative reconstruction technique. COMPARISON:  Prior CT from 03/12/2022. FINDINGS: Brain: Diffuse prominence of the CSF containing spaces compatible generalized cerebral atrophy. Moderately advanced chronic microvascular ischemic disease noted involving the supratentorial cerebral white matter. Few small remote lacunar infarcts about the bilateral basal ganglia/corona radiata. Small remote right cerebellar infarct. No acute intracranial hemorrhage. No acute large vessel territory infarct. No mass lesion, mass effect, or midline shift. No hydrocephalus or extra-axial fluid collection. Vascular: No abnormal hyperdense vessel. Calcified atherosclerosis present at skull base. Skull: Scalp soft tissues demonstrate no acute finding. Calvarium intact. Sinuses/Orbits: Globes and orbital soft tissues within normal limits. Chronic left maxillary sinusitis noted. Prior right mastoidectomy. Small chronic appearing left mastoid effusion noted. Other: None. IMPRESSION: 1. No acute intracranial abnormality. 2. Generalized cerebral atrophy with moderately advanced chronic small vessel ischemic disease. 3. Chronic left maxillary sinusitis. Electronically Signed   By: Pincus Badder.D.  On: 04/28/2022 03:30   DG Chest 2 View  Result Date: 04/27/2022 CLINICAL DATA:  Chest pain.  Shortness of breath. EXAM: CHEST - 2 VIEW COMPARISON:  CXR 03/12/22 FINDINGS: No pleural effusion. No pneumothorax. No focal airspace opacity. Unchanged cardiac and mediastinal contours. Status post median sternotomy. No displaced rib fractures. Visualized upper abdomen is notable for gas distended loops of bowel and stomach. IMPRESSION: No focal airspace opacity. Electronically Signed   By: Marin Roberts M.D.   On: 04/27/2022 17:01    Pending Labs Unresulted Labs (From admission, onward)     Start     Ordered   04/29/22 2671  Basic metabolic panel  Tomorrow morning,   R        04/28/22  0828   04/29/22 0500  CBC  Daily,   R      04/28/22 0828   04/29/22 0500  Heparin level (unfractionated)  Daily,   R      04/28/22 0942   04/29/22 0500  APTT  Daily,   R      04/28/22 0942   04/29/22 0500  Magnesium  Tomorrow morning,   R        04/28/22 1729   04/28/22 1800  Heparin level (unfractionated)  Once-Timed,   TIMED        04/28/22 0942   04/28/22 1800  APTT  Once-Timed,   TIMED        04/28/22 0942            Vitals/Pain Today's Vitals   04/28/22 1610 04/28/22 1615 04/28/22 1630 04/28/22 1735  BP: (!) 124/59 138/60 134/69 128/69  Pulse: (!) 58 (!) 55 (!) 54 (!) 57  Resp: 18 (!) 25 19 19   Temp: 98.7 F (37.1 C)     TempSrc:      SpO2: 100% 100% 98% 90%  Weight:      Height:      PainSc:        Isolation Precautions No active isolations  Medications Medications  sodium chloride flush (NS) 0.9 % injection 3 mL (3 mLs Intravenous Given 04/28/22 0930)  acetaminophen (TYLENOL) tablet 650 mg (650 mg Oral Given 04/28/22 1530)    Or  acetaminophen (TYLENOL) suppository 650 mg ( Rectal See Alternative 04/28/22 1530)  albuterol (PROVENTIL) (2.5 MG/3ML) 0.083% nebulizer solution 2.5 mg (has no administration in time range)  furosemide (LASIX) injection 40 mg (has no administration in time range)  heparin ADULT infusion 100 units/mL (25000 units/218mL) (1,250 Units/hr Intravenous Rate/Dose Verify 04/28/22 1125)  finasteride (PROSCAR) tablet 5 mg (has no administration in time range)  tamsulosin (FLOMAX) capsule 0.4 mg (has no administration in time range)  Lifitegrast 5 % SOLN 1 drop (has no administration in time range)  Zinc Oxide (TRIPLE PASTE) 12.8 % ointment (has no administration in time range)  FLUoxetine (PROZAC) capsule 20 mg (has no administration in time range)  rosuvastatin (CRESTOR) tablet 5 mg (has no administration in time range)  ipratropium-albuterol (DUONEB) 0.5-2.5 (3) MG/3ML nebulizer solution 3 mL (3 mLs Nebulization Given 04/28/22 0152)  iohexol  (OMNIPAQUE) 350 MG/ML injection 75 mL (75 mLs Intravenous Contrast Given 04/28/22 0248)  furosemide (LASIX) injection 40 mg (40 mg Intravenous Given 04/28/22 0714)    Mobility non-ambulatory     Focused Assessments Pulmonary Assessment Handoff:  Lung sounds:   O2 Device: Room Air      R Recommendations: See Admitting Provider Note  Report given to:   Additional Notes: patient presented with CP, dx  with PE. States there were recent changes to home anticoag meds. On heparin drip currently at 12.5 (new PTT due at 1800). denies CP and difficulty breathing at this time. 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

## 2022-04-28 NOTE — Telephone Encounter (Signed)
Pt c/o medication issue:  1. Name of Medication: Coumadin   2. How are you currently taking this medication (dosage and times per day)? Not sure if patient has started taking this medication.   3. Are you having a reaction (difficulty breathing--STAT)?   4. What is your medication issue? Patient's son would like a call back to discuss med change from hospital. He states they would like to start him on coumadin, but is uncertain and would like advice.

## 2022-04-28 NOTE — H&P (Signed)
History and Physical    Patient: Evan [name] EHM:[id number] DOB: December 03, 1927 DOA: 04/27/2022 DOS: the patient was seen and examined on 04/28/2022 PCP: [name], MD  Patient coming from: Home with family providing 24-hour care via EMS  Chief Complaint:  Chief Complaint  Patient presents with   Chest Pain   Shortness of Breath   HPI: Evan [name] is a 89 y.o. male with extensive medical history significant of VD, CKD 3A, radiation pneumonitis, stroke (thalamic hemorrhage) with residual hemiparesis, CAD status post CABG, PE, atrial fibrillation, hyperlipidemia, anxiety, depression, hypertension, lung cancer, HFrEF, pericarditis, GERD, COPD, BPH, dementia, and tremor who presents with complaints of progressively worsening shortness of breath over the last week.Marland Kitchen  History is obtained mostly from the patient's son who is present at bedside due to the patient having dementia.  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  Patient has had a productive cough and orthopnea.  Family reports that they have been checking his oxygenation at home and noted that had been stable.   Patient's son notes that he had recently had a fall hitting the right side of his temple about 2 weeks ago.  Review of records note patient was supposed to be on enoxaparin lifelong after failing apixaban in 2019, but appears to have been switched to Xarelto after patient had an intracranial bleed in 2020.  Currently, appears to have been on Xarelto 15 mg which is the appropriate dose for atrial fibrillation, but not necessarily for history of VTE 20 mg daily.  Family note that the patient has been taking the Xarelto with food.  In the emergency department patient was noted to be afebrile with pulse 40-64, respirations 14-22, blood pressures are maintaining O2 saturations as low as 87% with improvement on 2 L of nasal cannula oxygen.  Labs from 1/24 significant for hemoglobin 8.6,  sodium 132, BUN 24, creatinine 1.48,  high-sensitivity troponin 16->19, and BNP 683.2.  Chest x-ray was unremarkable.  Urinalysis did not show any acute abnormality.  Respiratory virus panel screening were negative.  CT angiogram of the chest significant for small acute pulmonary embolus with in posterior segmental pulmonary artery of the right lower lobe with evidence of right heart strain that appear to be more chronic in nature, mild interstitial pulmonary edema with small bilateral pleural effusions, and consolidation in the posterior basal right lower lobe concerning for possible pulmonary infarction.  Patient has been given furosemide 40 mg IV and a DuoNeb breathing treatment.  Review of Systems: As mentioned in the history of present illness. All other systems reviewed and are negative. Past Medical History:  Diagnosis Date   Adenocarcinoma of right lung ([place]) 01/06/2016   Anginal chest pain at rest    Chronic non, controlled on Lorazepam   Anxiety    Arthritis    "all over"    BPH (benign prostatic hyperplasia)    CAD (coronary artery disease)    Chronic lower back pain    Complication of anesthesia    "problems making water afterwards"   Depression    DJD (degenerative joint disease)    Esophageal ulcer without bleeding    bid ppi indefinitely   Family history of adverse reaction to anesthesia    "children w/PONV"   GERD (gastroesophageal reflux disease)    Headache    "couple/week maybe" (09/04/2015)   Hemochromatosis    Possible, elevated iron stores, hook-like osteophytes on hand films, normal  LFTs   Hepatitis    "yellow jaundice as a baby"   History of ASCVD    MULTIVESSEL   Hyperlipidemia    Hypertension    Mild aortic stenosis 06/23/2021   Osteoarthritis    Pneumonia    "several times; got a little now" (09/04/2015)   Rheumatoid arthritis ([place])    Subdural hematoma ([place])    small after fall 09/2016-plavix held, neurosurgery consulted.  Asymptomatic.     Thromboembolism  ([place]) 02/2018   on Lovenox lifelong since he failed PO Eliquis   Past Surgical History:  Procedure Laterality Date   ARM SKIN LESION BIOPSY / EXCISION Left 09/02/2015   CATARACT EXTRACTION W/ INTRAOCULAR LENS  IMPLANT, BILATERAL Bilateral    CHOLECYSTECTOMY OPEN     CORNEAL TRANSPLANT Bilateral    "one at [facility]; one at [facility]"   [place] W/ STENT     DE stent ostium into the right radial free graft at [facility]-, 02-2007   ESOPHAGOGASTRODUODENOSCOPY N/A 11/09/2014   Procedure: ESOPHAGOGASTRODUODENOSCOPY (EGD);  Surgeon: [name], MD;  Location: [facility] ENDOSCOPY;  Service: Endoscopy;  Laterality: N/A;   INGUINAL HERNIA REPAIR     JOINT REPLACEMENT     LEFT HEART CATH AND CORS/GRAFTS ANGIOGRAPHY N/A 06/27/2017   Procedure: LEFT HEART CATH AND CORS/GRAFTS ANGIOGRAPHY;  Surgeon: [name], MD;  Location: [facility];  Service: Cardiovascular;  Laterality: N/A;   NASAL SINUS SURGERY     SHOULDER OPEN ROTATOR CUFF REPAIR Right    TOTAL KNEE ARTHROPLASTY Bilateral    Social History:  reports that he has never smoked. He has been exposed to tobacco smoke. He has quit using smokeless tobacco.  His smokeless tobacco use included chew. He reports that he does not drink alcohol and does not use drugs.  Allergies  Allergen Reactions   Feldene [Piroxicam] Rash    Blistering rash   Neomycin-Polymyxin-Hc Itching   Statins Other (See Comments)    Myalgias   Valium [Diazepam] Other (See Comments)    Dizziness    Latex Rash   Tequin [Gatifloxacin] Anxiety   Vibra-Tab [Doxycycline] Nausea Only    Family History  Problem Relation Age of Onset   Arthritis-Osteo Sister    Heart attack Brother    Heart attack Other    Cancer Neg Hx     Prior to Admission medications   Medication Sig Start Date End Date Taking? Authorizing Provider  acetaminophen (TYLENOL) 500 MG tablet Take 500 mg by  mouth every 6 (six) hours as needed for mild pain, moderate pain, fever or headache.    [provider]  Cholecalciferol (VITAMIN D-3 PO) Take 1 capsule by mouth in the morning.    [provider]  finasteride (PROSCAR) 5 MG tablet Take 1 tablet (5 mg total) by mouth daily. 04/11/22   [name], MD  FLUoxetine (PROZAC) 20 MG capsule TAKE 1 CAPSULE EVERY DAY Patient taking differently: Take 20 mg by mouth in the morning. 01/05/22   [name], MD  furosemide (LASIX) 20 MG tablet Take 1 tablet (20 mg total) by mouth daily. Patient taking differently: Take 20 mg by mouth in the morning. 11/01/21   Jettie Booze, MD  Lifitegrast Shirley Friar) 5 % SOLN Place 1 drop into both eyes 2 (two) times daily. Patient taking differently: Place 1 drop into both eyes 2 (two) times daily as  needed (dry eyes). 12/01/21     metoprolol tartrate (LOPRESSOR) 25 MG tablet TAKE 1/2 TABLET TWICE DAILY Patient taking differently: Take 25 mg by mouth in the morning. 08/23/21   [name], MD  nitroGLYCERIN (NITROSTAT) 0.4 MG SL tablet Place 1 tablet (0.4 mg total) under the tongue every 5 (five) minutes as needed for chest pain. 02/01/21   [name], MD  pantoprazole (PROTONIX) 40 MG tablet Take 1 tablet (40 mg total) by mouth in the morning. 04/18/22   [name], MD  polyethylene glycol (MIRALAX / GLYCOLAX) 17 g packet Take 17 g by mouth daily as needed for mild constipation. 08/14/18   Angiulli, Lavon Paganini, PA-C  rosuvastatin (CRESTOR) 5 MG tablet TAKE 1 TABLET EVERY DAY Patient taking differently: Take 5 mg by mouth daily with supper. 08/16/21   [name], PA-C  tacrolimus (PROTOPIC) 0.1 % ointment Apply 1 application topically 2 (two) times daily. 11/10/20   [name], MD  tamsulosin (FLOMAX) 0.4 MG CAPS capsule TAKE 1 CAPSULE EVERY DAY Patient taking differently: Take 0.4 mg by mouth daily with supper. 01/10/22   [name], MD  XARELTO 15 MG TABS tablet  TAKE 1 TABLET EVERY DAY WITH SUPPER Patient taking differently: Take 15 mg by mouth daily with supper. 02/07/22   Jettie Booze, MD  ZINC OXIDE EX Apply 1 application  topically 4 (four) times daily as needed (skin barrier).    [provider]    Physical Exam: Vitals:   04/28/22 0345 04/28/22 0400 04/28/22 0730 04/28/22 0740  BP: (!) 143/60 (!) 143/84 (!) 119/94   Pulse: 61 64 68   Resp: (!) 22 19 17    Temp:    98.1 F (36.7 C)  TempSrc:    Oral  SpO2: (!) 88% (!) 87% 98%    Constitutional: Elderly male appears to be in no acute respiratory distress at this time Eyes: PERRL, lids and conjunctivae normal ENMT: Mucous membranes are moist.  Hard of hearing.  Bruising noted of the right temple. Neck: normal, supple.  JVD present Respiratory: normal respiratory effort without significant wheezing appreciated at this time.  Some mild crackles heard in the lower lung fields.  O2 saturations currently maintained on room air. Cardiovascular: Irregular irregular no extremity edema.  No significant lower extremity edema appreciated at this time. Abdomen: no tenderness, no masses palpated. Bowel sounds positive.  Musculoskeletal: no clubbing / cyanosis. No joint deformity upper and lower extremities.  Normal muscle tone.  Skin: Bruising of the right side of the temple. Neurologic: CN 2-12 grossly intact.  Right-sided weakness reported to be chronic. Psychiatric: Alert and oriented at least to self.  Normal mood.  Data Reviewed:  EKG revealed atrial fibrillation with slow ventricular response at 52 bpm.  Reviewed labs, imaging and pertinent records as noted above in HPI.  Assessment and Plan:  Acute respiratory failure with hypoxia secondary to heart failure with reduced EF Acute on chronic.  Patient presented with complaints of worsening shortness of breath.  Found to be hypoxic down to 87% on room air with improvement on 2 L nasal cannula oxygen.  CT angiogram of the chest  noted concern for mild pulmonary edema with small bilateral pleural effusions and a pulmonary embolism.  Right heart strain was noted but thought to be more so chronic in nature. BNP elevated at 683.2.  Last echocardiogram noted EF to be 45-50% with indeterminate diastolic parameters back in 04/2021.  Patient has been  given Lasix 40 mg IV. -Heart failure order set utilized -Strict I&O's and daily weights -Lasix 40 mg IV twice daily.  Reassess need of further diuresis in a.m. -Check echocardiogram -Consider formally consulting cardiology if needed in a.m.    Pulmonary embolism History of pulmonary embolism Acute.  Patient had a prior history of pulmonary embolism and had been on Xarelto for anticoagulation.  Records note that he had previously failed Eliquis back in 08/2018 and has been recommended to be on lifelong enoxaparin.  Family reports compliance with Xarelto, but he was on 15mg  daily and not the 20 recommended for VTE treatment. -Heparin drip per pharmacy -Discussed with Dr. Benay Spice of hematology oncology who recommended transitioning patient back to Lovenox subcutaneous, but upon further review it appears he suffered the thalamic hemorrhage while on Lovenox which was the likely cause of him being placed on Xarelto  Elevated troponin Acute.  Patient had noted some complaints of chest pain.  High-sensitivity troponin was mildly elevated 16->19 but essentially flat.  EKG without significant ischemic changes.  Suspect secondary to demand in setting of above. -Continue to monitor  Persistent atrial fibrillation on chronic anticoagulation Patient noted to be in atrial fibrillation with slow ventricular response with heart rates documented in the 40- 50s.   -Goal potassium at least 4 and magnesium at least 2 -Anticoagulation as noted above  Essential hypertension Blood pressures currently maintained. -Held metoprolol due to low heart rates  CKD stage IIIb Creatinine 1.48 with BUN 28 which  appears around patient's baseline. -Continue to monitor kidney function with diuresis  History of thalamic hemorrhage Patient had been on therapeutic Lovenox when he developed a left thalamic intraparenchymal hemorrhage in 2020  History of lung cancer Prior history of adenocarcinoma of the right lung status post radiation back in 2017  Mood disorder/depression -Continue Prozac  Hyperlipidemia -Continue Crestor  BPH -Continue finasteride and tamsulosin  Stage I pressure ulcer coccyx -Continue pressure dressing   DVT prophylaxis: Heparin Advance Care Planning:   Code Status: Full Code.  Confirmed with the patient and his son at bedside.  Son notes that he goes back and forth.  Consults: Over the phone discussion with Dr. Cloria Spring   Family Communication: Son updated at bedside  Severity of Illness: The appropriate patient status for this patient is INPATIENT. Inpatient status is judged to be reasonable and necessary in order to provide the required intensity of service to ensure the patient's safety. The patient's presenting symptoms, physical exam findings, and initial radiographic and laboratory data in the context of their chronic comorbidities is felt to place them at high risk for further clinical deterioration. Furthermore, it is not anticipated that the patient will be medically stable for discharge from the hospital within 2 midnights of admission.   * I certify that at the point of admission it is my clinical judgment that the patient will require inpatient hospital care spanning beyond 2 midnights from the point of admission due to high intensity of service, high risk for further deterioration and high frequency of surveillance required.*  Author: Norval Morton, MD 04/28/2022 8:08 AM  For on call review [email or web address].

## 2022-04-28 NOTE — Progress Notes (Signed)
Date and time results received: 04/28/22 2030   Test: APTT Critical Value: more than 200  Name of Provider Notified: Dr. Velia Meyer  Orders Received? Or Actions Taken?: Orders Received - See Orders for details

## 2022-04-28 NOTE — ED Provider Notes (Signed)
[facility] Provider Note   CSN: [id number] Arrival date & time: 04/27/22  1537     History  Chief Complaint  Patient presents with   Chest Pain   Shortness of Breath    Evan [name] is a 89 y.o. male.  The history is provided by the patient and a relative.  Patient with extensive history including aortic stenosis, previous subdural hematoma,hypertension atrial fibrillation, previous VTE presents with shortness of breath.  Most of the history is provided by the daughter who is one of his primary caregivers.  She reports for the past 2 nights he is reported shortness of breath.  She also reports that when he sleeps he appears to stop breathing and starts jerking for brief period of time.  No known history of seizures.  He has also been mentioning chest pain as well.   Daughter also reports patient fell about 2 weeks ago hitting his head and has bruising.  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   Anginal chest pain at rest    Chronic non, controlled on Lorazepam   Anxiety    Arthritis    "all over"    BPH (benign prostatic hyperplasia)    CAD (coronary artery disease)    Chronic lower back pain    Complication of anesthesia    "problems making water afterwards"   Depression    DJD (degenerative joint disease)    Esophageal ulcer without bleeding    bid ppi indefinitely   Family history of adverse reaction to anesthesia    "children w/PONV"   GERD (gastroesophageal reflux disease)    Headache    "couple/week maybe" (09/04/2015)   Hemochromatosis    Possible, elevated iron stores, hook-like osteophytes on hand films, normal LFTs   Hepatitis    "yellow jaundice as a baby"   History of ASCVD    MULTIVESSEL   Hyperlipidemia    Hypertension    Mild aortic stenosis 06/23/2021   Osteoarthritis    Pneumonia    "several times; got a little now" (09/04/2015)    Rheumatoid arthritis ([place])    Subdural hematoma ([place])    small after fall 09/2016-plavix held, neurosurgery consulted.  Asymptomatic.     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[place]) 02/2018   on Lovenox lifelong since he failed PO Eliquis    Home Medications Prior to Admission medications   Medication Sig Start Date End Date Taking? Authorizing Provider  acetaminophen (TYLENOL) 500 MG tablet Take 500 mg by mouth every 6 (six) hours as needed for mild pain, moderate pain, fever or headache.    [provider]  Cholecalciferol (VITAMIN D-3 PO) Take 1 capsule by mouth in the morning.    [provider]  finasteride (PROSCAR) 5 MG tablet Take 1 tablet (5 mg total) by mouth daily. 04/11/22   [name], MD  FLUoxetine (PROZAC) 20 MG capsule TAKE 1 CAPSULE EVERY DAY Patient taking differently: Take 20 mg by mouth in the morning. 01/05/22   [name], MD  furosemide (LASIX) 20 MG tablet Take 1 tablet (20 mg total) by mouth daily. Patient taking differently: Take 20 mg by mouth in the morning. 11/01/21   Jettie Booze, MD  Lifitegrast Shirley Friar) 5 % SOLN Place 1 drop into both eyes 2 (two) times daily. Patient taking differently: Place 1 drop into both eyes 2 (two) times daily as needed (dry eyes). 12/01/21  metoprolol tartrate (LOPRESSOR) 25 MG tablet TAKE 1/2 TABLET TWICE DAILY Patient taking differently: Take 25 mg by mouth in the morning. 08/23/21   [name], MD  nitroGLYCERIN (NITROSTAT) 0.4 MG SL tablet Place 1 tablet (0.4 mg total) under the tongue every 5 (five) minutes as needed for chest pain. 02/01/21   [name], MD  pantoprazole (PROTONIX) 40 MG tablet Take 1 tablet (40 mg total) by mouth in the morning. 04/18/22   [name], MD  polyethylene glycol (MIRALAX / GLYCOLAX) 17 g packet Take 17 g by mouth daily as needed for mild constipation. 08/14/18   Angiulli, Lavon Paganini, PA-C  rosuvastatin (CRESTOR) 5 MG tablet TAKE 1 TABLET EVERY DAY Patient taking  differently: Take 5 mg by mouth daily with supper. 08/16/21   [name], PA-C  tacrolimus (PROTOPIC) 0.1 % ointment Apply 1 application topically 2 (two) times daily. 11/10/20   [name], MD  tamsulosin (FLOMAX) 0.4 MG CAPS capsule TAKE 1 CAPSULE EVERY DAY Patient taking differently: Take 0.4 mg by mouth daily with supper. 01/10/22   [name], MD  XARELTO 15 MG TABS tablet TAKE 1 TABLET EVERY DAY WITH SUPPER Patient taking differently: Take 15 mg by mouth daily with supper. 02/07/22   Jettie Booze, MD  ZINC OXIDE EX Apply 1 application  topically 4 (four) times daily as needed (skin barrier).    [provider]      Allergies    Feldene [piroxicam], Neomycin-polymyxin-hc, Statins, Valium [diazepam], Latex, Tequin [gatifloxacin], and Vibra-tab [doxycycline]    Review of Systems   Review of Systems  Respiratory:  Positive for shortness of breath.     Physical Exam Updated Vital Signs BP (!) 143/84   Pulse 64   Temp 98.3 F (36.8 C)   Resp 19   SpO2 (!) 87%  Physical Exam CONSTITUTIONAL: Elderly, chronically ill-appearing HEAD: Bruising noted to right parietal scalp ENMT: Mucous membranes moist NECK: supple no meningeal signs SPINE/BACK:entire spine nontender, No bruising/crepitance/stepoffs noted to spine CV: Irregular LUNGS: Tachypnea noted, brief episodes of hypoxia noted, scattered wheezing noted bilaterally ABDOMEN: soft, nontender NEURO: Pt is awake/alert, chronic right-sided weakness noted SKIN: warm, color normal  ED Results / Procedures / Treatments   Labs (all labs ordered are listed, but only abnormal results are displayed) Labs Reviewed  BASIC METABOLIC PANEL - Abnormal; Notable for the following components:      Result Value   Sodium 132 (*)    CO2 20 (*)    Glucose, Bld 103 (*)    BUN 24 (*)    Creatinine, Ser 1.48 (*)    Calcium 8.7 (*)    GFR, Estimated 44 (*)    All other components within normal limits  CBC WITH  DIFFERENTIAL/PLATELET - Abnormal; Notable for the following components:   RBC 2.81 (*)    Hemoglobin 8.6 (*)    HCT 26.7 (*)    Platelets 115 (*)    All other components within normal limits  BRAIN NATRIURETIC PEPTIDE - Abnormal; Notable for the following components:   B Natriuretic Peptide 683.2 (*)    All other components within normal limits  TROPONIN I (HIGH SENSITIVITY) - Abnormal; Notable for the following components:   Troponin I (High Sensitivity) 19 (*)    All other components within normal limits  RESP PANEL BY RT-PCR (RSV, FLU A&B, COVID)  RVPGX2  URINALYSIS, ROUTINE W REFLEX MICROSCOPIC  TROPONIN I (HIGH SENSITIVITY)    EKG EKG  Interpretation  Date/Time:  Wednesday April 27 2022 16:23:17 EST Ventricular Rate:  52 PR Interval:    QRS Duration: 98 QT Interval:  526 QTC Calculation: 489 R Axis:   -16 Text Interpretation: Atrial fibrillation with slow ventricular response Septal infarct , age undetermined Abnormal ECG Confirmed by Ripley Fraise [phone]) on 04/28/2022 1:36:22 AM  Radiology CT Angio Chest PE W and/or Wo Contrast  Result Date: 04/28/2022 CLINICAL DATA:  Chest pain, dyspnea EXAM: CT ANGIOGRAPHY CHEST WITH CONTRAST TECHNIQUE: Multidetector CT imaging of the chest was performed using the standard protocol during bolus administration of intravenous contrast. Multiplanar CT image reconstructions and MIPs were obtained to evaluate the vascular anatomy. RADIATION DOSE REDUCTION: This exam was performed according to the departmental dose-optimization program which includes automated exposure control, adjustment of the mA and/or kV according to patient size and/or use of iterative reconstruction technique. CONTRAST:  36mL OMNIPAQUE IOHEXOL 350 MG/ML SOLN COMPARISON:  None Available. FINDINGS: Cardiovascular: There is adequate opacification of pulmonary arterial tree. There is a single intraluminal filling defect identified within the posterior segmental pulmonary  artery of the right lower lobe in keeping with an acute pulmonary embolus. The overall embolic burden is tiny. The central pulmonary arteries are dilated in keeping with changes of pulmonary arterial hypertension. Coronary artery bypass grafting has been performed. Global cardiac size is mildly enlarged, stable since prior examination. There is mild relative dilation of the right ventricle and right atrium in keeping with elevated right heart pressures as well as prominent reflux of contrast to the hepatic venous system in keeping with some degree of right heart failure. The RV/LV ratio is elevated (RV/LV ratio equal 1.0), however, this appears stable since prior examination and is likely chronic in nature. No pericardial effusion. Thoracic aorta is of normal caliber. Moderate atherosclerotic calcification. Mediastinum/Nodes: Shotty mediastinal adenopathy is nonspecific, possibly reactive in nature. No frankly pathologic thoracic adenopathy. Visualized thyroid unremarkable. Esophagus is unremarkable. Lungs/Pleura: Small bilateral pleural effusions are present. There is superimposed thickening of the peribronchovascular interstitium and scattered basilar and dependent predominant interstitial and ground-glass pulmonary infiltrates most suggestive of superimposed mild cardiogenic failure. More focal consolidation within the posterior basal right lower lobe may reflect superimposed pulmonary infarct given the previously identified pulmonary embolus. Upper Abdomen: No acute abnormality Musculoskeletal: No lytic or blastic bone lesion. Osseous structures are age-appropriate. Review of the MIP images confirms the above findings. IMPRESSION: 1. Small acute pulmonary embolus within the posterior segmental pulmonary artery of the right lower lobe. The overall embolic burden is tiny. While there is evidence right heart strain, this appears chronic in nature and is stable since prior examination. 2. Morphologic changes in  keeping with pulmonary arterial hypertension and elevated right heart pressures. Reflux of contrast into the hepatic venous system in keeping with at least some degree of right heart failure. 3. Mild interstitial and alveolar pulmonary edema and small bilateral pleural effusions in keeping with mild cardiogenic failure. 4. Superimposed focal consolidation within the posterior basal right lower lobe may reflect superimposed pulmonary infarct given the previously identified pulmonary embolus. These results were called by telephone at the time of interpretation on 04/28/2022 at 3:43 am to provider Ripley Fraise , who verbally acknowledged these results. Aortic Atherosclerosis (ICD10-I70.0). Electronically Signed   By: Fidela Salisbury M.D.   On: 04/28/2022 03:43   CT Head Wo Contrast  Result Date: 04/28/2022 CLINICAL DATA:  Evaluation for delirium. EXAM: CT HEAD WITHOUT CONTRAST TECHNIQUE: Contiguous axial images were obtained from the base of the  skull through the vertex without intravenous contrast. RADIATION DOSE REDUCTION: This exam was performed according to the departmental dose-optimization program which includes automated exposure control, adjustment of the mA and/or kV according to patient size and/or use of iterative reconstruction technique. COMPARISON:  Prior CT from 03/12/2022. FINDINGS: Brain: Diffuse prominence of the CSF containing spaces compatible generalized cerebral atrophy. Moderately advanced chronic microvascular ischemic disease noted involving the supratentorial cerebral white matter. Few small remote lacunar infarcts about the bilateral basal ganglia/corona radiata. Small remote right cerebellar infarct. No acute intracranial hemorrhage. No acute large vessel territory infarct. No mass lesion, mass effect, or midline shift. No hydrocephalus or extra-axial fluid collection. Vascular: No abnormal hyperdense vessel. Calcified atherosclerosis present at skull base. Skull: Scalp soft tissues  demonstrate no acute finding. Calvarium intact. Sinuses/Orbits: Globes and orbital soft tissues within normal limits. Chronic left maxillary sinusitis noted. Prior right mastoidectomy. Small chronic appearing left mastoid effusion noted. Other: None. IMPRESSION: 1. No acute intracranial abnormality. 2. Generalized cerebral atrophy with moderately advanced chronic small vessel ischemic disease. 3. Chronic left maxillary sinusitis. Electronically Signed   By: Jeannine Boga M.D.   On: 04/28/2022 03:30   DG Chest 2 View  Result Date: 04/27/2022 CLINICAL DATA:  Chest pain.  Shortness of breath. EXAM: CHEST - 2 VIEW COMPARISON:  CXR 03/12/22 FINDINGS: No pleural effusion. No pneumothorax. No focal airspace opacity. Unchanged cardiac and mediastinal contours. Status post median sternotomy. No displaced rib fractures. Visualized upper abdomen is notable for gas distended loops of bowel and stomach. IMPRESSION: No focal airspace opacity. Electronically Signed   By: Marin Roberts M.D.   On: 04/27/2022 17:01    Procedures Procedures    Medications Ordered in ED Medications  furosemide (LASIX) injection 40 mg (has no administration in time range)  ipratropium-albuterol (DUONEB) 0.5-2.5 (3) MG/3ML nebulizer solution 3 mL (3 mLs Nebulization Given 04/28/22 0152)  iohexol (OMNIPAQUE) 350 MG/ML injection 75 mL (75 mLs Intravenous Contrast Given 04/28/22 0248)    ED Course/ Medical Decision Making/ A&P Clinical Course as of 04/28/22 0445  Thu Apr 28, 2022  0421 BUN(!): 24 Renal insufficiency [DW]  0421 Hemoglobin(!): 8.6 Chronic anemia [DW]  [phone] Patient presented with family for multiple issues.  Patient has a history of A-fib, subdural and has for members at home to care for him as he has chronic weakness.  Patient recently had shortness of breath mostly at night and at times daughter reports the patient appears to stop breathing and has body jerking.  Patient has known history of PE and did have  episodes of hypoxia in the room.  CT chest reveals recurrent PE despite taking Xarelto.  Daughter reports he has been compliant. also has some evidence of pulmonary edema.  He did have a recent fall with bruising to his scalp, but CT head is negative. [DW]  [phone] Discussed with Dr. Myna Hidalgo for admission.  Will give Lasix for probable pulmonary edema.  Patient is already on anticoagulation with a breakthrough PE.  Will defer further anticoagulation to inpatient team [DW]    Clinical Course User Index [DW] Ripley Fraise, MD                             Medical Decision Making Amount and/or Complexity of Data Reviewed Labs: ordered. Decision-making details documented in ED Course. Radiology: ordered.  Risk Prescription drug management. Decision regarding hospitalization.   This patient presents to the ED for concern of shortness of  breath, this involves an extensive number of treatment options, and is a complaint that carries with it a high risk of complications and morbidity.  The differential diagnosis includes but is not limited to Acute coronary syndrome, pneumonia, acute pulmonary edema, pneumothorax, acute anemia, pulmonary embolism    Comorbidities that complicate the patient evaluation: Patient's presentation is complicated by their history of previous subdural, atrial fibrillation on coagulation  Social Determinants of Health: Patient's  poor mobility status   increases the complexity of managing their presentation  Additional history obtained: Additional history obtained from family Records reviewed previous admission documents  Lab Tests: I Ordered, and personally interpreted labs.  The pertinent results include: Chronic anemia, elevated BNP, renal insufficiency  Imaging Studies ordered: I ordered imaging studies including CT scan head and chest   I independently visualized and interpreted imaging which showed head is negative, CT chest reveals pulmonary edema and small  PE I agree with the radiologist interpretation  Cardiac Monitoring: The patient was maintained on a cardiac monitor.  I personally viewed and interpreted the cardiac monitor which showed an underlying rhythm of:  Atrial Fibrillation  Medicines ordered and prescription drug management: I ordered medication including DuoNeb for shortness of breath Reevaluation of the patient after these medicines showed that the patient    stayed the same  Lasix given for pulmonary edema  Critical Interventions:   admission  Consultations Obtained: I requested consultation with the admitting physician Dr. Myna Hidalgo , and discussed  findings as well as pertinent plan - they recommend: Admit  Reevaluation: After the interventions noted above, I reevaluated the patient and found that they have :stayed the same  Complexity of problems addressed: Patient's presentation is most consistent with  acute presentation with potential threat to life or bodily function  Disposition: After consideration of the diagnostic results and the patient's response to treatment,  I feel that the patent would benefit from admission   .           Final Clinical Impression(s) / ED Diagnoses Final diagnoses:  None    Rx / DC Orders ED Discharge Orders     None         Ripley Fraise, MD 04/28/22 [phone]

## 2022-04-28 NOTE — Telephone Encounter (Signed)
I spoke with patient's son. He reports patient is currently at the hospital and has been diagnosed with blood clots in his lungs.  I advised son to ask any questions he may have to providers at hospital.

## 2022-04-29 ENCOUNTER — Other Ambulatory Visit (HOSPITAL_COMMUNITY): Payer: Self-pay

## 2022-04-29 ENCOUNTER — Inpatient Hospital Stay (HOSPITAL_COMMUNITY): Payer: Medicare HMO

## 2022-04-29 DIAGNOSIS — J9601 Acute respiratory failure with hypoxia: Secondary | ICD-10-CM | POA: Diagnosis not present

## 2022-04-29 DIAGNOSIS — Z86711 Personal history of pulmonary embolism: Secondary | ICD-10-CM | POA: Diagnosis not present

## 2022-04-29 LAB — CBC
HCT: 23.8 % — ABNORMAL LOW (ref 39.0–52.0)
Hemoglobin: 7.7 g/dL — ABNORMAL LOW (ref 13.0–17.0)
MCH: 29.6 pg (ref 26.0–34.0)
MCHC: 32.4 g/dL (ref 30.0–36.0)
MCV: 91.5 fL (ref 80.0–100.0)
Platelets: 104 10*3/uL — ABNORMAL LOW (ref 150–400)
RBC: 2.6 MIL/uL — ABNORMAL LOW (ref 4.22–5.81)
RDW: 14.4 % (ref 11.5–15.5)
WBC: 5.2 10*3/uL (ref 4.0–10.5)
nRBC: 0 % (ref 0.0–0.2)

## 2022-04-29 LAB — BASIC METABOLIC PANEL
Anion gap: 8 (ref 5–15)
BUN: 23 mg/dL (ref 8–23)
CO2: 21 mmol/L — ABNORMAL LOW (ref 22–32)
Calcium: 8.5 mg/dL — ABNORMAL LOW (ref 8.9–10.3)
Chloride: 105 mmol/L (ref 98–111)
Creatinine, Ser: 1.48 mg/dL — ABNORMAL HIGH (ref 0.61–1.24)
GFR, Estimated: 44 mL/min — ABNORMAL LOW (ref >60)
Glucose, Bld: 98 mg/dL (ref 70–99)
Potassium: 3.8 mmol/L (ref 3.5–5.1)
Sodium: 134 mmol/L — ABNORMAL LOW (ref 135–145)

## 2022-04-29 LAB — HEMOGLOBIN AND HEMATOCRIT, BLOOD
HCT: 23.9 % — ABNORMAL LOW (ref 39.0–52.0)
Hemoglobin: 7.8 g/dL — ABNORMAL LOW (ref 13.0–17.0)

## 2022-04-29 LAB — APTT
aPTT: 178 seconds (ref 24–36)
aPTT: 74 seconds — ABNORMAL HIGH (ref 24–36)

## 2022-04-29 LAB — HEPARIN LEVEL (UNFRACTIONATED): Heparin Unfractionated: 0.95 IU/mL — ABNORMAL HIGH (ref 0.30–0.70)

## 2022-04-29 LAB — MAGNESIUM: Magnesium: 2 mg/dL (ref 1.7–2.4)

## 2022-04-29 MED ORDER — FUROSEMIDE 10 MG/ML IJ SOLN
40.0000 mg | Freq: Every day | INTRAMUSCULAR | Status: DC
  Administered 2022-04-30: 40 mg via INTRAVENOUS
  Filled 2022-04-29: qty 4

## 2022-04-29 MED ORDER — NALOXONE HCL 0.4 MG/ML IJ SOLN: 0.4000 mg | INTRAMUSCULAR | Status: DC | PRN

## 2022-04-29 MED ORDER — SODIUM CHLORIDE 0.9 % IV SOLN
1.0000 g | INTRAVENOUS | Status: DC
  Administered 2022-04-29 – 2022-05-02 (×4): 1 g via INTRAVENOUS
  Filled 2022-04-29 (×6): qty 10

## 2022-04-29 MED ORDER — ORAL CARE MOUTH RINSE: 15.0000 mL | OROMUCOSAL | Status: DC | PRN

## 2022-04-29 MED ORDER — HYDROCODONE-ACETAMINOPHEN 5-325 MG PO TABS
1.0000 | ORAL_TABLET | Freq: Once | ORAL | Status: AC | PRN
  Administered 2022-04-29: 1 via ORAL
  Filled 2022-04-29: qty 1

## 2022-04-29 MED ORDER — HEPARIN (PORCINE) 25000 UT/250ML-% IV SOLN
900.0000 [IU]/h | INTRAVENOUS | Status: DC
  Administered 2022-04-29 – 2022-04-30 (×2): 750 [IU]/h via INTRAVENOUS
  Administered 2022-05-02: 900 [IU]/h via INTRAVENOUS
  Filled 2022-04-29 (×3): qty 250

## 2022-04-29 MED ORDER — LORAZEPAM 2 MG/ML IJ SOLN
0.5000 mg | Freq: Once | INTRAMUSCULAR | Status: AC
  Administered 2022-04-29: 0.5 mg via INTRAVENOUS
  Filled 2022-04-29: qty 1

## 2022-04-29 NOTE — Progress Notes (Signed)
  Date and time results received: 04/29/22 0835     Test: APTT Critical Value: 178   Name of Provider Notified: Dr. Florene Glen, Kendra Opitz   Orders Received? Or Actions Taken?: Orders Received - See Orders for details

## 2022-04-29 NOTE — Progress Notes (Signed)
Lower ext venous duplex  has been completed. Refer to [facility] under chart review to view preliminary results.   04/29/2022  2:44 PM Jesilyn Easom, Bonnye Fava

## 2022-04-29 NOTE — Progress Notes (Signed)
PROGRESS NOTE    Evan [name]  GYI:[id number] DOB: 10/12/1927 DOA: 04/27/2022 PCP: Evan Frizzle, MD  Chief Complaint  Patient presents with   Chest Pain   Shortness of Breath    Brief Narrative:   Evan [name] is Evan [name] 89 y.o. male with extensive medical history significant of VD, CKD 3A, radiation pneumonitis, stroke (thalamic hemorrhage) with residual hemiparesis, CAD status post CABG, PE, atrial fibrillation, hyperlipidemia, anxiety, depression, hypertension, lung cancer, HFrEF, pericarditis, GERD, COPD, BPH, dementia, and tremor who presents with complaints of progressively worsening shortness of breath over the last week.Marland Kitchen  History is obtained mostly from the patient's son who is present at bedside due to the patient having dementia.  He normally gets around with use of Evan [name] wheelchair due to being paralyzed on his right side.  Patient has had Evan [name] productive cough and orthopnea.  Family reports that they have been checking his oxygenation at home and noted that had been stable.   Patient's son notes that he had recently had Evan [name] fall hitting the right side of his temple about 2 weeks ago.  Review of records note patient was supposed to be on enoxaparin lifelong after failing apixaban in 2019, but appears to have been switched to Xarelto after patient had an intracranial bleed in 2020.  Currently, appears to have been on Xarelto 15 mg which is the appropriate dose for atrial fibrillation, but not necessarily for history of VTE 20 mg daily.  Family note that the patient has been taking the Xarelto with food.   In the emergency department patient was noted to be afebrile with pulse 40-64, respirations 14-22, blood pressures are maintaining O2 saturations as low as 87% with improvement on 2 L of nasal cannula oxygen.  Labs from 1/24 significant for hemoglobin 8.6, sodium 132, BUN 24, creatinine 1.48,  high-sensitivity troponin 16->19, and BNP 683.2.  Chest x-ray was unremarkable.  Urinalysis did not show any  acute abnormality.  Respiratory virus panel screening were negative.  CT angiogram of the chest significant for small acute pulmonary embolus with in posterior segmental pulmonary artery of the right lower lobe with evidence of right heart strain that appear to be more chronic in nature, mild interstitial pulmonary edema with small bilateral pleural effusions, and consolidation in the posterior basal right lower lobe concerning for possible pulmonary infarction.  Patient has been given furosemide 40 mg IV and Evan [name] DuoNeb breathing treatment.   Assessment & Plan:   Principal Problem:   Acute respiratory failure with hypoxia (HCC) Active Problems:   HFmrEF (heart failure with mildly reduced ejection fraction) (HCC)   Acute pulmonary embolism (HCC)   History of pulmonary embolism   Elevated troponin   Persistent atrial fibrillation (HCC)   BPH (benign prostatic hyperplasia)   Hypokalemia  Chills  Rigors Focal Consolidation within Posterior Basal RLL He appeared to have rigors while I was in room CT did show findings concerning for focal consolidation, will treat for possible pneumonia - could be pulm infarct Follow blood cultures  Acute respiratory failure with hypoxia secondary to heart failure with reduced EF Acute on chronic.  Patient presented with complaints of worsening shortness of breath.  Found to be hypoxic down to 87% on room air with improvement on 2 L nasal cannula oxygen.  CT angiogram of the chest noted concern for mild pulmonary edema with small bilateral pleural effusions and Evan [name] pulmonary embolism.  Right heart strain was noted but thought to be more so chronic in nature.  BNP elevated at 683.2.  Last echocardiogram noted EF to be 45-50% with indeterminate diastolic parameters back in 04/2021.  Patient has been given Lasix 40 mg IV. -Strict I&O's and daily weights -CXR in AM -reduce lasix frequency to daily, doesn't appear significantly overloaded today, will change to 40 mg daily  and follow  -Check echocardiogram - EF 50-55%, mildly reduced RVSF   Pulmonary embolism History of pulmonary embolism Acute.  Patient had Evan [name] prior history of pulmonary embolism and had been on Xarelto for anticoagulation.  Records note that he had previously failed Eliquis back in 08/2018 and has been recommended to be on lifelong enoxaparin.  Family reports compliance with Xarelto, but he was on 15mg  daily and not the 20 recommended for VTE treatment. -Heparin drip per pharmacy -Case was discussed with Dr. Benay [name] of hematology oncology who recommended transitioning patient back to Lovenox subcutaneous, but upon further review it appears he suffered the thalamic hemorrhage while on Lovenox which was the likely cause of him being placed on Xarelto.  Will need to discuss further, discuss risks/benefits - not particularly straightforward.  Will need to discuss further with patient/family/hematology.   Elevated troponin Acute.  Patient had noted some complaints of chest pain.  High-sensitivity troponin was mildly elevated 16->19 but essentially flat.  EKG without significant ischemic changes.  Suspect secondary to demand in setting of above. -Continue to monitor   Persistent atrial fibrillation on chronic anticoagulation Patient noted to be in atrial fibrillation with slow ventricular response with heart rates documented in the 40- 50s.   -Goal potassium at least 4 and magnesium at least 2 -Anticoagulation as noted above   Essential hypertension Blood pressures currently maintained. -Held metoprolol due to low heart rates   CKD stage IIIb Creatinine 1.48 with BUN 28 which appears around patient's baseline. 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 (Active)  04/28/22 2030  Location: Sacrum  Location Orientation: Mid  Staging: Stage 1 -  Intact skin with non-blanchable redness of Caius Silbernagel localized area usually over Benjimin Hadden bony prominence.  Wound Description (Comments):   Present on Admission: Yes      DVT prophylaxis: heparin gtt Code Status: full Family Communication: daughter at bedside Disposition:   Status is: Inpatient Remains inpatient appropriate because: continued SOB   Consultants:  oncology  Procedures:  Echo IMPRESSIONS     1. Assessing wall motion is challenging. Left ventricular ejection  fraction, by estimation, is 50 to 55%. The left ventricle has low normal  function.   2. Right ventricular systolic function is mildly reduced. The right  ventricular size is moderately enlarged. There is moderately elevated  pulmonary artery systolic pressure.   3. Left atrial size was moderately dilated.   4. Trivial mitral valve regurgitation.   5. Aortic valve regurgitation is not visualized.   6. The inferior vena cava is normal in size with <50% respiratory  variability, suggesting right atrial pressure of 8 mmHg.   LE [place]  Summary:  BILATERAL:  - No evidence of deep vein thrombosis seen in the lower extremities,  bilaterally.  -No evidence of popliteal cyst, bilaterally.   Antimicrobials:  Anti-infectives (From admission, onward)    None       Subjective: C/o chills  Objective: Vitals:   04/29/22 0345 04/29/22 0752 04/29/22 1224 04/29/22 1713  BP: (!) 116/52 (!) 103/54 110/82 (!) 106/58  Pulse: 62 66  69  Resp: 20 20 18 18   Temp: 97.7 F (36.5 C)  98.4 F  (36.9 C) 98.7 F (37.1 C)  TempSrc: Oral  Oral Oral  SpO2: 96% 99%  95%  Weight: 67 kg     Height:        Intake/Output Summary (Last 24 hours) at 04/29/2022 1716 Last data filed at 04/29/2022 1300 Gross per 24 hour  Intake 600 ml  Output 1500 ml  Net -900 ml   Filed Weights   04/28/22 0931 04/28/22 2343 04/29/22 0345  Weight: 77.1 kg 67.9 kg 67 kg    Examination:  General exam: Appears calm and comfortable  Respiratory system: diminished, no clear adventitious lung sounds Cardiovascular system: RRR Gastrointestinal system: Abdomen is nondistended, soft and nontender.  Central nervous system: Alert and oriented. No focal neurological deficits. Extremities: no LEE   Data Reviewed: I have personally reviewed following labs and imaging studies  CBC: Recent Labs  Lab 04/27/22 1643 04/29/22 0710 04/29/22 1501  WBC 5.8 5.2  --   NEUTROABS 3.6  --   --   HGB 8.6* 7.7* 7.8*  HCT 26.7* 23.8* 23.9*  MCV 95.0 91.5  --   PLT 115* 104*  --     Basic Metabolic Panel: Recent Labs  Lab 04/27/22 1643 04/29/22 0551  NA 132* 134*  K 4.1 3.8  CL 103 105  CO2 20* 21*  GLUCOSE 103* 98  BUN 24* 23  CREATININE 1.48* 1.48*  CALCIUM 8.7* 8.5*  MG  --  2.0    GFR: Estimated Creatinine Clearance: 25.6 mL/min (Jawana Reagor) (by C-G formula based on SCr of 1.48 mg/dL (H)).  Liver Function Tests: No results for input(s): "AST", "ALT", "ALKPHOS", "BILITOT", "PROT", "ALBUMIN" in the last 168 hours.  CBG: No results for input(s): "GLUCAP" in the last 168 hours.   Recent Results (from the past 240 hour(s))  Resp panel by RT-PCR (RSV, Flu Jamayah Myszka&B, Covid) Anterior Nasal Swab     Status: None   Collection Time: 04/28/22  1:53 AM   Specimen: Anterior Nasal Swab  Result Value Ref Range Status   SARS Coronavirus 2 by RT PCR NEGATIVE NEGATIVE Final    Comment: (NOTE) SARS-CoV-2 target nucleic acids are NOT DETECTED.  The SARS-CoV-2 RNA is generally detectable in upper respiratory specimens  during the acute phase of infection. The lowest concentration of SARS-CoV-2 viral copies this assay can detect is 138 copies/mL. Raymonda Pell negative result does not preclude SARS-Cov-2 infection and should not be used as the sole basis for treatment or other patient management decisions. Yemaya Barnier negative result may occur with  improper specimen collection/handling, submission of specimen other than nasopharyngeal swab, presence of viral mutation(s) within the areas targeted by this assay, and inadequate number of viral copies(<138 copies/mL). Sahas Sluka negative result must be combined with clinical observations, patient history, and epidemiological information. The expected result is Negative.  Fact Sheet for Patients:  EntrepreneurPulse.com.au  Fact Sheet for Healthcare Providers:  IncredibleEmployment.be  This test is no t yet approved or cleared by the [place] FDA and  has  been authorized for detection and/or diagnosis of SARS-CoV-2 by FDA under an Emergency Use Authorization (EUA). This EUA will remain  in effect (meaning this test can be used) for the duration of the COVID-19 declaration under Section 564(b)(1) of the Act, 21 U.S.C.section 360bbb-3(b)(1), unless the authorization is terminated  or revoked sooner.       Influenza Jolita Haefner by PCR NEGATIVE NEGATIVE Final   Influenza B by PCR NEGATIVE NEGATIVE Final    Comment: (NOTE) The Xpert Xpress SARS-CoV-2/FLU/RSV plus assay is intended as an aid in the diagnosis of influenza from Nasopharyngeal swab specimens and should not be used as Amarra Sawyer sole basis for treatment. Nasal washings and aspirates are unacceptable for Xpert Xpress SARS-CoV-2/FLU/RSV testing.  Fact Sheet for Patients: EntrepreneurPulse.com.au  Fact Sheet for Healthcare Providers: IncredibleEmployment.be  This test is not yet approved or cleared by the [place] FDA and has been authorized for detection  and/or diagnosis of SARS-CoV-2 by FDA under an Emergency Use Authorization (EUA). This EUA will remain in effect (meaning this test can be used) for the duration of the COVID-19 declaration under Section 564(b)(1) of the Act, 21 U.S.C. section 360bbb-3(b)(1), unless the authorization is terminated or revoked.     Resp Syncytial Virus by PCR NEGATIVE NEGATIVE Final    Comment: (NOTE) Fact Sheet for Patients: EntrepreneurPulse.com.au  Fact Sheet for Healthcare Providers: IncredibleEmployment.be  This test is not yet approved or cleared by the [place] FDA and has been authorized for detection and/or diagnosis of SARS-CoV-2 by FDA under an Emergency Use Authorization (EUA). This EUA will remain in effect (meaning this test can be used) for the duration of the COVID-19 declaration under Section 564(b)(1) of the Act, 21 U.S.C. section 360bbb-3(b)(1), unless the authorization is terminated or revoked.  Performed at [facility], [place] [address] [place], [place] [address]          Radiology Studies: VAS [place] LOWER EXTREMITY VENOUS (DVT)  Result Date: 04/29/2022  Lower Venous DVT Study Patient Name:  Evan [name]  Date of Exam:   04/29/2022 Medical Rec #: [id number]     Accession #:    [id number] Date of Birth: [birth year]     Patient Gender: M Patient Age:   38 years Exam Location:  [facility] Procedure:      VAS [place] LOWER EXTREMITY VENOUS (DVT) Referring Phys: Jazzlyn Huizenga Evan JR --------------------------------------------------------------------------------  Indications: History of PE.  Comparison Study: 01/27/20 Negative LEV Performing Technologist: Velva Harman Sturdivant RDMS, RVT  Examination Guidelines: Jalen Daluz complete evaluation includes B-mode imaging, spectral Doppler, color Doppler, and power Doppler as needed of all accessible portions of each vessel. Bilateral testing is considered an integral part of Elany Felix complete examination. Limited examinations  for reoccurring indications may be performed as noted. The reflux portion of the exam is performed with the patient in reverse Trendelenburg.  +---------+---------------+---------+-----------+----------+--------------+ RIGHT    CompressibilityPhasicitySpontaneityPropertiesThrombus Aging +---------+---------------+---------+-----------+----------+--------------+ CFV      Full           Yes      Yes                                 +---------+---------------+---------+-----------+----------+--------------+ SFJ      Full                                                        +---------+---------------+---------+-----------+----------+--------------+  FV Prox  Full                                                        +---------+---------------+---------+-----------+----------+--------------+ FV Mid   Full                                                        +---------+---------------+---------+-----------+----------+--------------+ FV DistalFull                                                        +---------+---------------+---------+-----------+----------+--------------+ PFV      Full                                                        +---------+---------------+---------+-----------+----------+--------------+ POP      Full           Yes      Yes                                 +---------+---------------+---------+-----------+----------+--------------+ PTV      Full                                                        +---------+---------------+---------+-----------+----------+--------------+ PERO     Full                                                        +---------+---------------+---------+-----------+----------+--------------+   +---------+---------------+---------+-----------+----------+--------------+ LEFT     CompressibilityPhasicitySpontaneityPropertiesThrombus Aging  +---------+---------------+---------+-----------+----------+--------------+ CFV      Full           Yes      Yes                                 +---------+---------------+---------+-----------+----------+--------------+ SFJ      Full                                                        +---------+---------------+---------+-----------+----------+--------------+ FV Prox  Full                                                        +---------+---------------+---------+-----------+----------+--------------+  FV Mid   Full                                                        +---------+---------------+---------+-----------+----------+--------------+ FV DistalFull                                                        +---------+---------------+---------+-----------+----------+--------------+ PFV      Full                                                        +---------+---------------+---------+-----------+----------+--------------+ POP      Full           Yes      Yes                                 +---------+---------------+---------+-----------+----------+--------------+ PTV      Full                                                        +---------+---------------+---------+-----------+----------+--------------+ PERO     Full                                                        +---------+---------------+---------+-----------+----------+--------------+     Summary: BILATERAL: - No evidence of deep vein thrombosis seen in the lower extremities, bilaterally. -No evidence of popliteal cyst, bilaterally.   *See table(s) above for measurements and observations.    Preliminary    ECHOCARDIOGRAM LIMITED  Result Date: 04/28/2022    ECHOCARDIOGRAM LIMITED REPORT   Patient Name:   YAIDEN [name] Date of Exam: 04/28/2022 Medical Rec #:  [id number]    Height:       63.0 in Accession #:    [id number]   Weight:       170.0 lb Date of Birth:  10-09-1927    BSA:           1.805 m Patient Age:    81 years     BP:           124/59 mmHg Patient Gender: M            HR:           60 bpm. Exam Location:  Inpatient Procedure: Limited Echo, Cardiac Doppler and Limited Color Doppler Indications:    CHF-Acute Systolic I34.74  History:        Patient has prior history of Echocardiogram examinations, most                 recent 04/13/2021. CAD, Aortic Valve Disease, Arrythmias:Atrial  Fibrillation, Signs/Symptoms:Shortness of Breath; Risk                 Factors:Non-Smoker, Hypertension and Dyslipidemia.  Sonographer:    Greer Pickerel Referring Phys: [id number] RONDELL Alaynna Kerwood SMITH  Sonographer Comments: Image acquisition challenging due to patient body habitus and Image acquisition challenging due to respiratory motion. IMPRESSIONS  1. Assessing wall motion is challenging. Left ventricular ejection fraction, by estimation, is 50 to 55%. The left ventricle has low normal function.  2. Right ventricular systolic function is mildly reduced. The right ventricular size is moderately enlarged. There is moderately elevated pulmonary artery systolic pressure.  3. Left atrial size was moderately dilated.  4. Trivial mitral valve regurgitation.  5. Aortic valve regurgitation is not visualized.  6. The inferior vena cava is normal in size with <50% respiratory variability, suggesting right atrial pressure of 8 mmHg. FINDINGS  Left Ventricle: Assessing wall motion is challenging. Left ventricular ejection fraction, by estimation, is 50 to 55%. The left ventricle has low normal function. There is no left ventricular hypertrophy. Right Ventricle: The right ventricular size is moderately enlarged. Right ventricular systolic function is mildly reduced. There is moderately elevated pulmonary artery systolic pressure. The tricuspid regurgitant velocity is 2.93 m/s, and with an assumed right atrial pressure of 15 mmHg, the estimated right ventricular systolic pressure is 93.8 mmHg. Left Atrium: Left  atrial size was moderately dilated. Right Atrium: Right atrial size was normal in size. Mitral Valve: Trivial mitral valve regurgitation. Tricuspid Valve: Tricuspid valve regurgitation is mild. Aortic Valve: Aortic valve regurgitation is not visualized. Pulmonic Valve: Pulmonic valve regurgitation is not visualized. Venous: The inferior vena cava is normal in size with less than 50% respiratory variability, suggesting right atrial pressure of 8 mmHg. Additional Comments: Spectral Doppler performed. Color Doppler performed.  LEFT VENTRICLE PLAX 2D LVIDd:         3.80 cm LVIDs:         3.00 cm LV PW:         1.00 cm LV IVS:        0.90 cm  MITRAL VALVE               TRICUSPID VALVE MV Area (PHT): 6.32 cm    TR Peak grad:   34.3 mmHg MV Decel Time: 120 msec    TR Vmax:        293.00 cm/s MV E velocity: 92.20 cm/s MV Meridee Branum velocity: 37.80 cm/s MV E/Charlee Whitebread ratio:  2.44 Landscape architect signed by Phineas Inches Signature Date/Time: 04/28/2022/5:35:41 PM    Final    CT Angio Chest PE W and/or Wo Contrast  Result Date: 04/28/2022 CLINICAL DATA:  Chest pain, dyspnea EXAM: CT ANGIOGRAPHY CHEST WITH CONTRAST TECHNIQUE: Multidetector CT imaging of the chest was performed using the standard protocol during bolus administration of intravenous contrast. Multiplanar CT image reconstructions and MIPs were obtained to evaluate the vascular anatomy. RADIATION DOSE REDUCTION: This exam was performed according to the departmental dose-optimization program which includes automated exposure control, adjustment of the mA and/or kV according to patient size and/or use of iterative reconstruction technique. CONTRAST:  64mL OMNIPAQUE IOHEXOL 350 MG/ML SOLN COMPARISON:  None Available. FINDINGS: Cardiovascular: There is adequate opacification of pulmonary arterial tree. There is Myda Detwiler single intraluminal filling defect identified within the posterior segmental pulmonary artery of the right lower lobe in keeping with an acute pulmonary embolus.  The overall embolic burden is tiny. The central pulmonary arteries are dilated in keeping with changes of  pulmonary arterial hypertension. Coronary artery bypass grafting has been performed. Global cardiac size is mildly enlarged, stable since prior examination. There is mild relative dilation of the right ventricle and right atrium in keeping with elevated right heart pressures as well as prominent reflux of contrast to the hepatic venous system in keeping with some degree of right heart failure. The RV/LV ratio is elevated (RV/LV ratio equal 1.0), however, this appears stable since prior examination and is likely chronic in nature. No pericardial effusion. Thoracic aorta is of normal caliber. Moderate atherosclerotic calcification. Mediastinum/Nodes: Shotty mediastinal adenopathy is nonspecific, possibly reactive in nature. No frankly pathologic thoracic adenopathy. Visualized thyroid unremarkable. Esophagus is unremarkable. Lungs/Pleura: Small bilateral pleural effusions are present. There is superimposed thickening of the peribronchovascular interstitium and scattered basilar and dependent predominant interstitial and ground-glass pulmonary infiltrates most suggestive of superimposed mild cardiogenic failure. More focal consolidation within the posterior basal right lower lobe may reflect superimposed pulmonary infarct given the previously identified pulmonary embolus. Upper Abdomen: No acute abnormality Musculoskeletal: No lytic or blastic bone lesion. Osseous structures are age-appropriate. Review of the MIP images confirms the above findings. IMPRESSION: 1. Small acute pulmonary embolus within the posterior segmental pulmonary artery of the right lower lobe. The overall embolic burden is tiny. While there is evidence right heart strain, this appears chronic in nature and is stable since prior examination. 2. Morphologic changes in keeping with pulmonary arterial hypertension and elevated right heart  pressures. Reflux of contrast into the hepatic venous system in keeping with at least some degree of right heart failure. 3. Mild interstitial and alveolar pulmonary edema and small bilateral pleural effusions in keeping with mild cardiogenic failure. 4. Superimposed focal consolidation within the posterior basal right lower lobe may reflect superimposed pulmonary infarct given the previously identified pulmonary embolus. These results were called by telephone at the time of interpretation on 04/28/2022 at 3:43 am to provider Ripley Fraise , who verbally acknowledged these results. Aortic Atherosclerosis (ICD10-I70.0). Electronically Signed   By: Fidela Salisbury M.D.   On: 04/28/2022 03:43   CT Head Wo Contrast  Result Date: 04/28/2022 CLINICAL DATA:  Evaluation for delirium. EXAM: CT HEAD WITHOUT CONTRAST TECHNIQUE: Contiguous axial images were obtained from the base of the skull through the vertex without intravenous contrast. RADIATION DOSE REDUCTION: This exam was performed according to the departmental dose-optimization program which includes automated exposure control, adjustment of the mA and/or kV according to patient size and/or use of iterative reconstruction technique. COMPARISON:  Prior CT from 03/12/2022. FINDINGS: Brain: Diffuse prominence of the CSF containing spaces compatible generalized cerebral atrophy. Moderately advanced chronic microvascular ischemic disease noted involving the supratentorial cerebral white matter. Few small remote lacunar infarcts about the bilateral basal ganglia/corona radiata. Small remote right cerebellar infarct. No acute intracranial hemorrhage. No acute large vessel territory infarct. No mass lesion, mass effect, or midline shift. No hydrocephalus or extra-axial fluid collection. Vascular: No abnormal hyperdense vessel. Calcified atherosclerosis present at skull base. Skull: Scalp soft tissues demonstrate no acute finding. Calvarium intact. Sinuses/Orbits: Globes  and orbital soft tissues within normal limits. Chronic left maxillary sinusitis noted. Prior right mastoidectomy. Small chronic appearing left mastoid effusion noted. Other: None. IMPRESSION: 1. No acute intracranial abnormality. 2. Generalized cerebral atrophy with moderately advanced chronic small vessel ischemic disease. 3. Chronic left maxillary sinusitis. Electronically Signed   By: Jeannine Boga M.D.   On: 04/28/2022 03:30        Scheduled Meds:  finasteride  5 mg Oral Daily  FLUoxetine  20 mg Oral q AM   furosemide  40 mg Intravenous BID   rosuvastatin  5 mg Oral Q supper   sodium chloride flush  3 mL Intravenous Q12H   tamsulosin  0.4 mg Oral Q supper   Continuous Infusions:  heparin 750 Units/hr (04/29/22 1131)     LOS: 1 day    Time spent: over 30 min    Fayrene Helper, MD [facility]   To contact the attending provider between 7A-7P or the covering provider during after hours 7P-7A, please log into the web site [email or web address] and access using universal [place] password for that web site. If you do not have the password, please call the hospital operator.  04/29/2022, 5:16 PM

## 2022-04-29 NOTE — Progress Notes (Signed)
TRH night cross cover note:   I was notified by RN that the patient is experiencing a headache that is refractory to prn dose of acetaminophen.  I considered placing order for NSAIDs.  However, in the setting of his chronic kidney disease as well as report of a history of gastrointestinal bleed, I would refrain from NSAIDs at this time.  Rather, I placed an order for a one-time prn dose of Norco 5/325 mg p.o. x 1, and communicated this plan to the patient's RN.   Babs Bertin, DO Hospitalist

## 2022-04-29 NOTE — Progress Notes (Signed)
ANTICOAGULATION CONSULT NOTE  Pharmacy Consult for Heparin infusion  Indication: pulmonary embolus  Allergies  Allergen Reactions   Feldene [Piroxicam] Rash    Blistering rash   Neomycin-Polymyxin-Hc Itching   Statins Other (See Comments)    Myalgias   Valium [Diazepam] Other (See Comments)    Dizziness    Latex Rash   Tequin [Gatifloxacin] Anxiety   Vibra-Tab [Doxycycline] Nausea Only    Patient Measurements: Height: 5\' 3"  (160 cm) Weight: 67 kg (147 lb 11.2 oz) IBW/kg (Calculated) : 56.9 Heparin Dosing Weight: 72.9 kg  Vital Signs: Temp: 98.6 F (37 C) (01/26 1945) Temp Source: Oral (01/26 1945) BP: 110/53 (01/26 1945) Pulse Rate: 74 (01/26 1945)  Labs: Recent Labs    04/27/22 1643 04/27/22 1829 04/28/22 1808 04/29/22 0551 04/29/22 0710 04/29/22 1501 04/29/22 1824  HGB 8.6*  --   --   --  7.7* 7.8*  --   HCT 26.7*  --   --   --  23.8* 23.9*  --   PLT 115*  --   --   --  104*  --   --   APTT  --   --  >200*  --  178*  --  74*  HEPARINUNFRC  --   --  >1.10*  --  0.95*  --   --   CREATININE 1.48*  --   --  1.48*  --   --   --   TROPONINIHS 16 19*  --   --   --   --   --      Estimated Creatinine Clearance: 25.6 mL/min (A) (by C-G formula based on SCr of 1.48 mg/dL (H)).   Medical History: Past Medical History:  Diagnosis Date   Adenocarcinoma of right lung (Uncertain) 01/06/2016   Anginal chest pain at rest    Chronic non, controlled on Lorazepam   Anxiety    Arthritis    "all over"    BPH (benign prostatic hyperplasia)    CAD (coronary artery disease)    Chronic lower back pain    Complication of anesthesia    "problems making water afterwards"   Depression    DJD (degenerative joint disease)    Esophageal ulcer without bleeding    bid ppi indefinitely   Family history of adverse reaction to anesthesia    "children w/PONV"   GERD (gastroesophageal reflux disease)    Headache    "couple/week maybe" (09/04/2015)   Hemochromatosis    Possible,  elevated iron stores, hook-like osteophytes on hand films, normal LFTs   Hepatitis    "yellow jaundice as a baby"   History of ASCVD    MULTIVESSEL   Hyperlipidemia    Hypertension    Mild aortic stenosis 06/23/2021   Osteoarthritis    Pneumonia    "several times; got a little now" (09/04/2015)   Rheumatoid arthritis ([place])    Subdural hematoma ([place])    small after fall 09/2016-plavix held, neurosurgery consulted.  Asymptomatic.     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[place]) 02/2018   on Lovenox lifelong since he failed PO Eliquis    Medications:  Medications Prior to Admission  Medication Sig Dispense Refill Last Dose   acetaminophen (TYLENOL) 500 MG tablet Take 500 mg by mouth every 6 (six) hours as needed for mild pain, moderate pain, fever or headache.   04/27/2022   Cholecalciferol (VITAMIN D-3 PO) Take 1 capsule by mouth in the morning.   04/27/2022   finasteride (PROSCAR) 5 MG tablet Take 1 tablet (  5 mg total) by mouth daily. 90 tablet 3 04/27/2022   FLUoxetine (PROZAC) 20 MG capsule TAKE 1 CAPSULE EVERY DAY (Patient taking differently: Take 20 mg by mouth in the morning.) 90 capsule 0 04/27/2022   furosemide (LASIX) 20 MG tablet Take 1 tablet (20 mg total) by mouth daily. (Patient taking differently: Take 20 mg by mouth in the morning.) 90 tablet 3 04/27/2022   Lifitegrast (XIIDRA) 5 % SOLN Place 1 drop into both eyes 2 (two) times daily. (Patient taking differently: Place 1 drop into both eyes 2 (two) times daily as needed (dry eyes).) 60 each 12 Past Month   metoprolol tartrate (LOPRESSOR) 25 MG tablet TAKE 1/2 TABLET TWICE DAILY (Patient taking differently: Take 12.5 mg by mouth daily.) 90 tablet 3 04/27/2022 at 0800   nitroGLYCERIN (NITROSTAT) 0.4 MG SL tablet Place 1 tablet (0.4 mg total) under the tongue every 5 (five) minutes as needed for chest pain. 25 tablet 2 UNK   pantoprazole (PROTONIX) 40 MG tablet Take 1 tablet (40 mg total) by mouth in the morning. 90 tablet 0 04/27/2022   polyethylene glycol  (MIRALAX / GLYCOLAX) 17 g packet Take 17 g by mouth daily as needed for mild constipation. 14 each 0 Past Month   rosuvastatin (CRESTOR) 5 MG tablet TAKE 1 TABLET EVERY DAY (Patient taking differently: Take 5 mg by mouth daily with supper.) 90 tablet 1 04/26/2022   tamsulosin (FLOMAX) 0.4 MG CAPS capsule TAKE 1 CAPSULE EVERY DAY (Patient taking differently: Take 0.4 mg by mouth daily with supper.) 90 capsule 10 04/26/2022   XARELTO 15 MG TABS tablet TAKE 1 TABLET EVERY DAY WITH SUPPER (Patient taking differently: Take 15 mg by mouth daily with supper.) 30 tablet 10 04/26/2022 at 1800   ZINC OXIDE EX Apply 1 application  topically 4 (four) times daily as needed (skin barrier).   Past Month    Assessment: 89 yo M with history of atrial fibrillation and VTE on rivaroxaban 15mg  every evening with dinner. Last dose of rivaroxaban was 04/27/22 evening. CT PE positive for small acute PE in subsegmental pulomonary artery of RLL with evidence of chronic right heart strain. Pharmacy consulted to initiate and dose heparin for acute PE.   Heparin level and aPTT remain elevated despite holding and dose reduction. H/H down slightly, no S/Sx bleeding.  PTT came back therapeutic tonight. We will cont with current rate and check confirm level in AM.  Goal of Therapy:  Heparin level 0.3-0.7 units/ml aPTT 66-102 seconds Monitor platelets by anticoagulation protocol: Yes   Plan:  Cont heparin 750 units/h Recheck aPTT/HL in AM  Onnie Boer, PharmD, BCIDP, AAHIVP, CPP Infectious Disease Pharmacist 04/29/2022 9:18 PM

## 2022-04-29 NOTE — TOC Benefit Eligibility Note (Signed)
Patient Teacher, English as a foreign language completed.    The patient is currently admitted and upon discharge could be taking enoxaparin (Lovenox) 60 mg/0.6 ml inj.  The current 30 day co-pay is $95.00.   The patient is insured through [place], [place] Patient Advocate Specialist [place] Patient Advocate Team Direct Number: [phone]  Fax: [phone]

## 2022-04-29 NOTE — TOC Initial Note (Signed)
Transition of Care [facility]) - Initial/Assessment Note    Patient Details  Name: Evan [name] MRN: [id number] Date of Birth: [birth year]  Transition of Care [facility]) CM/SW Contact:    Bethena Roys, RN Phone Number: 04/29/2022, 3:32 PM  Clinical Narrative:  Risk for readmission assessment completed. PTA patient was from home alone; however, has support of daughter and two sons that provide around the clock care. Daughter states she stays during the day and the sons stay at night. Patient has DME cane, rolling walker, bedside commode and wheelchair. Patient is active with [facility] for RN and PT- if this continues to be the plan will need resumption orders.                 Expected Discharge Plan: [place] Barriers to Discharge: Continued Medical Work up   Patient Goals and CMS Choice Patient states their goals for this hospitalization and ongoing recovery are:: to return home.   Expected Discharge Plan and Services In-house Referral: NA Discharge Planning Services: CM Consult Post Acute Care Choice: Home Health, Resumption of Svcs/PTA Provider Living arrangements for the past 2 months: Single Family Home                   DME Agency: NA       HH Arranged: RN, Disease Management, PT [place] Agency: [place] Date HH Agency Contacted: 04/29/22 Time [place]: 15 Representative spoke with at [place]: [place] Arrangements/Services Living arrangements for the past 2 months: [place] with:: Self (children alternae care around the clock.) Patient language and need for interpreter reviewed:: Yes Do you feel safe going back to the place where you live?: Yes      Need for Family Participation in Patient Care: Yes (Comment) Care giver support system in place?: Yes (comment) Current home services: DME (cane, rolling walker, bedside commode, wheelchair.) Criminal Activity/Legal Involvement  Pertinent to Current Situation/Hospitalization: No - Comment as needed  Activities of Daily Living Home Assistive Devices/Equipment: Bedside commode/3-in-1, Blood pressure cuff, Brace (specify type), Cane (specify quad or straight), Walker (specify type), Wheelchair, [employer], Grab bars in shower, Hand-held shower hose, Hearing aid, Hospital bed, Scales, Shower chair with back, Transfer belt (right leg brace) ADL Screening (condition at time of admission) Patient's cognitive ability adequate to safely complete daily activities?: Yes Is the patient deaf or have difficulty hearing?: Yes (has hearing aids) Does the patient have difficulty seeing, even when wearing glasses/contacts?: No Does the patient have difficulty concentrating, remembering, or making decisions?: Yes Patient able to express need for assistance with ADLs?: Yes Does the patient have difficulty dressing or bathing?: Yes Independently performs ADLs?: No Communication: Independent Dressing (OT): Needs assistance Is this a change from baseline?: Pre-admission baseline Grooming: Needs assistance Is this a change from baseline?: Pre-admission baseline Feeding: Needs assistance Is this a change from baseline?: Pre-admission baseline Bathing: Needs assistance Is this a change from baseline?: Pre-admission baseline Toileting: Needs assistance Is this a change from baseline?: Pre-admission baseline In/Out Bed: Needs assistance Is this a change from baseline?: Pre-admission baseline Walks in Home: Needs assistance Is this a change from baseline?: Pre-admission baseline Does the patient have difficulty walking or climbing stairs?: No Weakness of Legs: Right (history of stroke) Weakness of Arms/Hands: Right (history of stroke)  Permission Sought/Granted Permission sought to share information with : Family Supports, Customer service manager, Case Manager Permission granted to share information with : Yes,  Verbal Permission  Granted     Permission granted to share info w AGENCY: [facility]        Emotional Assessment Appearance:: Appears stated age       Alcohol / Substance Use: Not Applicable Psych Involvement: No (comment)  Admission diagnosis:  Acute pulmonary edema ([place]) [J81.0] Acute respiratory failure with hypoxia ([place]) [J96.01] Patient Active Problem List   Diagnosis Date Noted   Acute respiratory failure with hypoxia ([place]) 04/28/2022   Persistent atrial fibrillation ([place]) 04/28/2022   History of pulmonary embolism 04/28/2022   Pneumonia 03/12/2022   Hemiparesis due to old stroke ([place]) 06/25/2021   Mild aortic stenosis 06/23/2021   Rib pain on right side 03/19/2020   Sundowning 07/04/2019   Bilateral hearing loss 04/16/2019   Urinary frequency 03/21/2019   Closed wedge fracture of lumbar vertebra ([place]) 03/14/2019   Chronic right shoulder pain 12/19/2018   Weakness generalized 09/28/2018   Palliative care encounter 09/28/2018   Pressure injury of skin 08/27/2018   COPD (chronic obstructive pulmonary disease) ([place]) 08/26/2018   Altered mental status 08/21/2018   UTI (urinary tract infection) 08/21/2018   Abnormality of gait 08/20/2018   Prolonged Q-T interval on ECG    Dysphagia, post-stroke    Supplemental oxygen dependent    Fatigue    SOB (shortness of breath)    Hyperglycemia    History of lung cancer    Depression    Stage 3a chronic kidney disease ([place])    Acute blood loss anemia    Fall 07/16/2018   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   Acute spontaneous 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 ([place]) L thalamic 07/14/2018   Cellulitis of arm, right 06/01/2018   Rash and nonspecific skin eruption 06/01/2018   Chest pain 02/02/2018   Thrombocytopenia ([place]) 02/02/2018   Hypokalemia 02/02/2018   Acute pulmonary embolism ([place]) 02/02/2018   Elevated troponin 02/02/2018   Old MI (myocardial  infarction) 06/27/2017   HFmrEF (heart failure with mildly reduced ejection fraction) ([place]) 06/26/2017   Dyspnea 06/21/2017   Acute bronchitis 05/15/2017   Oral candidiasis 05/15/2017   History of pericarditis    Chest pain at rest 12/20/2016   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   Pleurisy 10/11/2016   Chest wall pain 10/11/2016   Subdural hematoma ([place]) 09/13/2016   Permanent atrial fibrillation ([place]) 05/21/2016   Adenocarcinoma of right lung ([place]) 01/06/2016   GAD (generalized anxiety disorder) 11/20/2015   Hypotension    Cellulitis 09/04/2015   Cellulitis of left axilla    Esophageal ulcer without bleeding    Bilateral lower extremity edema 04/28/2014   Coronary artery disease involving native coronary artery of native heart without angina pectoris 09/09/2013   BPH (benign prostatic hyperplasia) 10/08/2012   EKG abnormalities 09/05/2012   Tremor 09/05/2012   Chronic fatigue 09/05/2012   Arthritis    Asthma, chronic    Reflux esophagitis    S/P CABG (coronary artery bypass graft)    GERD (gastroesophageal reflux disease)    Hyperlipidemia LDL goal <70    Essential hypertension    UNSPECIFIED PERIPHERAL VASCULAR DISEASE 06/16/2009   PCP:  [name], MD Pharmacy:  No Pharmacies Listed    Social Determinants of Health (SDOH) Social History: SDOH Screenings   Food Insecurity: No Food Insecurity (04/28/2022)  Housing: Low Risk  (04/28/2022)  Transportation Needs: No Transportation Needs (04/28/2022)  Utilities: Not At Risk (04/28/2022)  Alcohol Screen: Low Risk  (05/07/2021)  Depression (  PHQ2-9): Low Risk  (03/25/2022)  Recent Concern: Depression (PHQ2-9) - High Risk (01/05/2022)  Financial Resource Strain: Low Risk  (05/07/2021)  Physical Activity: Insufficiently Active (05/07/2021)  Social Connections: Socially Isolated (05/07/2021)  Stress: No Stress Concern Present (05/07/2021)  Tobacco Use: Medium Risk (04/27/2022)   Readmission Risk Interventions    04/29/2022     3:21 PM  Readmission Risk Prevention Plan  Transportation Screening Complete  HRI or Home Care Consult Complete  Social Work Consult for [place] Planning/Counseling Complete  Palliative Care Screening Not Applicable  Medication Review Press photographer) Referral to Pharmacy

## 2022-04-29 NOTE — Progress Notes (Signed)
ANTICOAGULATION CONSULT NOTE  Pharmacy Consult for Heparin infusion  Indication: pulmonary embolus  Allergies  Allergen Reactions   Feldene [Piroxicam] Rash    Blistering rash   Neomycin-Polymyxin-Hc Itching   Statins Other (See Comments)    Myalgias   Valium [Diazepam] Other (See Comments)    Dizziness    Latex Rash   Tequin [Gatifloxacin] Anxiety   Vibra-Tab [Doxycycline] Nausea Only    Patient Measurements: Height: 5\' 3"  (160 cm) Weight: 67 kg (147 lb 11.2 oz) IBW/kg (Calculated) : 56.9 Heparin Dosing Weight: 72.9 kg  Vital Signs: Temp: 97.7 F (36.5 C) (01/26 0345) Temp Source: Oral (01/26 0345) BP: 103/54 (01/26 0752) Pulse Rate: 66 (01/26 0752)  Labs: Recent Labs    04/27/22 1643 04/27/22 1829 04/28/22 1808 04/29/22 0551 04/29/22 0710  HGB 8.6*  --   --   --  7.7*  HCT 26.7*  --   --   --  23.8*  PLT 115*  --   --   --  104*  APTT  --   --  >200*  --  178*  HEPARINUNFRC  --   --  >1.10*  --  0.95*  CREATININE 1.48*  --   --  1.48*  --   TROPONINIHS 16 19*  --   --   --      Estimated Creatinine Clearance: 25.6 mL/min (A) (by C-G formula based on SCr of 1.48 mg/dL (H)).   Medical History: Past Medical History:  Diagnosis Date   Adenocarcinoma of right lung ([place]) 01/06/2016   Anginal chest pain at rest    Chronic non, controlled on Lorazepam   Anxiety    Arthritis    "all over"    BPH (benign prostatic hyperplasia)    CAD (coronary artery disease)    Chronic lower back pain    Complication of anesthesia    "problems making water afterwards"   Depression    DJD (degenerative joint disease)    Esophageal ulcer without bleeding    bid ppi indefinitely   Family history of adverse reaction to anesthesia    "children w/PONV"   GERD (gastroesophageal reflux disease)    Headache    "couple/week maybe" (09/04/2015)   Hemochromatosis    Possible, elevated iron stores, hook-like osteophytes on hand films, normal LFTs   Hepatitis    "yellow jaundice  as a baby"   History of ASCVD    MULTIVESSEL   Hyperlipidemia    Hypertension    Mild aortic stenosis 06/23/2021   Osteoarthritis    Pneumonia    "several times; got a little now" (09/04/2015)   Rheumatoid arthritis ([place])    Subdural hematoma ([place] )    small after fall 09/2016-plavix held, neurosurgery consulted.  Asymptomatic.     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[place]) 02/2018   on Lovenox lifelong since he failed PO Eliquis    Medications:  Medications Prior to Admission  Medication Sig Dispense Refill Last Dose   acetaminophen (TYLENOL) 500 MG tablet Take 500 mg by mouth every 6 (six) hours as needed for mild pain, moderate pain, fever or headache.   04/27/2022   Cholecalciferol (VITAMIN D-3 PO) Take 1 capsule by mouth in the morning.   04/27/2022   finasteride (PROSCAR) 5 MG tablet Take 1 tablet (5 mg total) by mouth daily. 90 tablet 3 04/27/2022   FLUoxetine (PROZAC) 20 MG capsule TAKE 1 CAPSULE EVERY DAY (Patient taking differently: Take 20 mg by mouth in the morning.) 90 capsule 0 04/27/2022   furosemide (  LASIX) 20 MG tablet Take 1 tablet (20 mg total) by mouth daily. (Patient taking differently: Take 20 mg by mouth in the morning.) 90 tablet 3 04/27/2022   Lifitegrast (XIIDRA) 5 % SOLN Place 1 drop into both eyes 2 (two) times daily. (Patient taking differently: Place 1 drop into both eyes 2 (two) times daily as needed (dry eyes).) 60 each 12 Past Month   metoprolol tartrate (LOPRESSOR) 25 MG tablet TAKE 1/2 TABLET TWICE DAILY (Patient taking differently: Take 12.5 mg by mouth daily.) 90 tablet 3 04/27/2022 at 0800   nitroGLYCERIN (NITROSTAT) 0.4 MG SL tablet Place 1 tablet (0.4 mg total) under the tongue every 5 (five) minutes as needed for chest pain. 25 tablet 2 UNK   pantoprazole (PROTONIX) 40 MG tablet Take 1 tablet (40 mg total) by mouth in the morning. 90 tablet 0 04/27/2022   polyethylene glycol (MIRALAX / GLYCOLAX) 17 g packet Take 17 g by mouth daily as needed for mild constipation. 14 each 0 Past  Month   rosuvastatin (CRESTOR) 5 MG tablet TAKE 1 TABLET EVERY DAY (Patient taking differently: Take 5 mg by mouth daily with supper.) 90 tablet 1 04/26/2022   tamsulosin (FLOMAX) 0.4 MG CAPS capsule TAKE 1 CAPSULE EVERY DAY (Patient taking differently: Take 0.4 mg by mouth daily with supper.) 90 capsule 10 04/26/2022   XARELTO 15 MG TABS tablet TAKE 1 TABLET EVERY DAY WITH SUPPER (Patient taking differently: Take 15 mg by mouth daily with supper.) 30 tablet 10 04/26/2022 at 1800   ZINC OXIDE EX Apply 1 application  topically 4 (four) times daily as needed (skin barrier).   Past Month    Assessment: 89 yo M with history of atrial fibrillation and VTE on rivaroxaban 15mg  every evening with dinner. Last dose of rivaroxaban was 04/27/22 evening. CT PE positive for small acute PE in subsegmental pulomonary artery of RLL with evidence of chronic right heart strain. Pharmacy consulted to initiate and dose heparin for acute PE.   Heparin level and aPTT remain elevated despite holding and dose reduction. H/H down slightly, no S/Sx bleeding.  Goal of Therapy:  Heparin level 0.3-0.7 units/ml aPTT 66-102 seconds Monitor platelets by anticoagulation protocol: Yes   Plan:  Hold heparin x1h Reduce rate to 750 units/h Recheck aPTT in 8h  Arrie Senate, PharmD, [place], [facility] Clinical Pharmacist [phone] Please check AMION for all [facility] Pharmacy numbers 04/29/2022

## 2022-04-30 ENCOUNTER — Inpatient Hospital Stay (HOSPITAL_COMMUNITY): Payer: Medicare HMO

## 2022-04-30 DIAGNOSIS — J9601 Acute respiratory failure with hypoxia: Secondary | ICD-10-CM | POA: Diagnosis not present

## 2022-04-30 LAB — COMPREHENSIVE METABOLIC PANEL
ALT: 8 U/L (ref 0–44)
AST: 16 U/L (ref 15–41)
Albumin: 3.1 g/dL — ABNORMAL LOW (ref 3.5–5.0)
Alkaline Phosphatase: 86 U/L (ref 38–126)
Anion gap: 11 (ref 5–15)
BUN: 25 mg/dL — ABNORMAL HIGH (ref 8–23)
CO2: 22 mmol/L (ref 22–32)
Calcium: 8.6 mg/dL — ABNORMAL LOW (ref 8.9–10.3)
Chloride: 102 mmol/L (ref 98–111)
Creatinine, Ser: 1.79 mg/dL — ABNORMAL HIGH (ref 0.61–1.24)
GFR, Estimated: 35 mL/min — ABNORMAL LOW (ref >60)
Glucose, Bld: 110 mg/dL — ABNORMAL HIGH (ref 70–99)
Potassium: 3.7 mmol/L (ref 3.5–5.1)
Sodium: 135 mmol/L (ref 135–145)
Total Bilirubin: 0.6 mg/dL (ref 0.3–1.2)
Total Protein: 6.4 g/dL — ABNORMAL LOW (ref 6.5–8.1)

## 2022-04-30 LAB — HEPARIN LEVEL (UNFRACTIONATED): Heparin Unfractionated: 0.43 [IU]/mL (ref 0.30–0.70)

## 2022-04-30 LAB — CBC
HCT: 23.8 % — ABNORMAL LOW (ref 39.0–52.0)
Hemoglobin: 7.7 g/dL — ABNORMAL LOW (ref 13.0–17.0)
MCH: 29.7 pg (ref 26.0–34.0)
MCHC: 32.4 g/dL (ref 30.0–36.0)
MCV: 91.9 fL (ref 80.0–100.0)
Platelets: 87 10*3/uL — ABNORMAL LOW (ref 150–400)
RBC: 2.59 MIL/uL — ABNORMAL LOW (ref 4.22–5.81)
RDW: 14.4 % (ref 11.5–15.5)
WBC: 5 10*3/uL (ref 4.0–10.5)
nRBC: 0 % (ref 0.0–0.2)

## 2022-04-30 LAB — MAGNESIUM: Magnesium: 2 mg/dL (ref 1.7–2.4)

## 2022-04-30 LAB — PHOSPHORUS: Phosphorus: 4.7 mg/dL — ABNORMAL HIGH (ref 2.5–4.6)

## 2022-04-30 LAB — APTT: aPTT: 77 s — ABNORMAL HIGH (ref 24–36)

## 2022-04-30 MED ORDER — LORATADINE 10 MG PO TABS
10.0000 mg | ORAL_TABLET | Freq: Every day | ORAL | Status: DC
  Administered 2022-04-30 – 2022-05-03 (×4): 10 mg via ORAL
  Filled 2022-04-30 (×4): qty 1

## 2022-04-30 NOTE — Evaluation (Signed)
Physical Therapy Evaluation Patient Details Name: Evan [name] MRN: [id number] DOB: [birth year] Today's Date: 04/30/2022  History of Present Illness  89 y.o. male admitted 1/24 with  Acute respiratory failure with hypoxia, found to have acute pulmonary embolism. 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 Daughter present during therapy evaluation, observed and supportive. Reports pt near baseline with mobility but was mostly min guard with transfers and short distance ambulation in house with chair follow. He was able to transfer from bed<>w/c with supervision PTA. Currently requires min assist  (+2 during today's session but can be handled by +1 moving forward for transfers.) Family provides 24/7 assist at home. Agreeable to HHPT to help improve functional independence again, reduce fall risk, and decrease burden of care. Pt currently with functional limitations due to the deficits listed below (see PT Problem List). Pt will benefit from skilled PT to increase their independence and safety with mobility to allow discharge to the venue listed below.          Recommendations for follow up therapy are one component of a multi-disciplinary discharge planning process, led by the attending physician.  Recommendations may be updated based on patient status, additional functional criteria and insurance authorization.  Follow Up Recommendations Home health PT      Assistance Recommended at Discharge Frequent or constant Supervision/Assistance  Patient can return home with the following  A little help with walking and/or transfers;A little help with bathing/dressing/bathroom;Assistance with cooking/housework;Assist for  transportation;Help with stairs or ramp for entrance;Direct supervision/assist for medications management;Direct supervision/assist for financial management    Equipment Recommendations None recommended by PT  Recommendations for Other Services       Functional Status Assessment Patient has had a recent decline in their functional status and demonstrates the ability to make significant improvements in function in a reasonable and predictable amount of time.     Precautions / Restrictions Restrictions Weight Bearing Restrictions: No      Mobility  Bed Mobility Overal bed mobility: Needs Assistance Bed Mobility: Rolling, Sidelying to Sit, Supine to Sit Rolling: Min assist Sidelying to sit: Min assist, HOB elevated Supine to sit: Min assist, +2 for physical assistance     General bed mobility comments: Min assist for rolling towards Rt side, use of rail to pull self to edge, min assist with HOB elevated for trunk support to rise. Min assist +2 for trunk control and LEs back into bed. Able to readjust self once supine again.    Transfers Overall transfer level: Needs assistance Equipment used: 2 person hand held assist Transfers: Sit to/from Stand, Bed to chair/wheelchair/BSC Sit to Stand: Min assist, +2 physical assistance   Step pivot transfers: Min assist, +2 physical assistance       General transfer comment: Very light min assist from +2 with hand held support from bed x2, and recliner x1. Performed with cues to facilitate and to provide balance. difficulty with RLE coordination. Took several pivotal steps forward and lateral too and from chair with Min A +2. Tremulous but without overt buckling. Dtr reports near baseline.    Ambulation/Gait                  Stairs  Wheelchair Mobility    Modified Rankin (Stroke Patients Only)       Balance Overall balance assessment: Needs assistance Sitting-balance support: Single extremity supported,  Feet supported Sitting balance-[name] Scale: Poor Sitting balance - Comments: Sits unsupported for the most part but using UE to brace himself during session   Standing balance support: During functional activity, Bilateral upper extremity supported Standing balance-[name] Scale: Poor Standing balance comment: Reliant on UE support to remain standing. Able to reposition feet with cues however showing poor control of RLE.                             Pertinent Vitals/Pain Pain Assessment Pain Assessment: No/denies pain    Home Living Family/patient expects to be discharged to:: Private residence Living Arrangements: Alone (family rotates to stay with him around the clock) Available Help at Discharge: Family;Available 24 hours/day Type of Home: House Home Access: Ramped entrance       Home Layout: Two level;Able to live on main level with bedroom/bathroom Home Equipment: Rolling Walker (2 wheels);Hospital bed;Wheelchair - manual;BSC/3in1;Shower seat      Prior Function Prior Level of Function : Needs assist             Mobility Comments: walks with RW and gait belt/min guard; ~1109ft a few times/day ADLs Comments: assist for all ADLs     Hand Dominance   Dominant Hand: Left    Extremity/Trunk Assessment   Upper Extremity Assessment Upper Extremity Assessment: Defer to OT evaluation    Lower Extremity Assessment Lower Extremity Assessment: Generalized weakness (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 SpO2 99-100 % on 1-2 L throughout  session.    Exercises     Assessment/Plan    PT Assessment Patient needs continued PT services  PT Problem List Decreased strength;Decreased range of motion;Decreased activity tolerance;Decreased balance;Decreased mobility;Decreased coordination;Decreased cognition;Decreased knowledge of use of DME;Decreased safety awareness;Decreased knowledge of precautions;Cardiopulmonary status limiting activity;Impaired tone       PT Treatment Interventions DME instruction;Gait training;Functional mobility training;Therapeutic activities;Therapeutic exercise;Balance training;Neuromuscular re-education;Patient/family education;Cognitive remediation    PT Goals (Current goals can be found in the Care Plan section)  Acute Rehab PT Goals Patient Stated Goal: n/a PT Goal Formulation: With patient/family Time For Goal Achievement: 05/13/22 Potential to Achieve Goals: Good    Frequency Min 3X/week     Co-evaluation PT/OT/SLP Co-Evaluation/Treatment: Yes Reason for Co-Treatment: Complexity of the patient's impairments (multi-system involvement);Necessary to address cognition/behavior during functional activity;For patient/therapist safety;To address functional/ADL transfers PT goals addressed during session: Mobility/safety with mobility;Balance         AM-PAC PT "6 Clicks" Mobility  Outcome Measure Help needed turning from your back to your side while in a flat bed without using bedrails?: A Little Help needed moving from lying on your back to sitting on the side of a flat bed without using bedrails?: A Little Help needed moving to and from  a bed to a chair (including a wheelchair)?: Total Help needed standing up from a chair using your arms (e.g., wheelchair or bedside chair)?: Total Help needed to walk in hospital room?: Total Help needed climbing 3-5 steps with a railing? : Total 6 Click Score: 10    End of Session Equipment Utilized During Treatment: Gait belt;Oxygen Activity Tolerance:  Patient tolerated treatment well Patient left: in bed;with call bell/phone within reach;with bed alarm set;with family/visitor present (Staff coming to room to bathe pt after PT/OT session)   PT Visit Diagnosis: Unsteadiness on feet (R26.81);Other abnormalities of gait and mobility (R26.89);Muscle weakness (generalized) (M62.81);History of falling (Z91.81);Ataxic gait (R26.0);Difficulty in walking, not elsewhere classified (R26.2);Other symptoms and signs involving the nervous system (R29.898);Hemiplegia and hemiparesis Hemiplegia - Right/Left: Right Hemiplegia - dominant/non-dominant: Dominant Hemiplegia - caused by:  (chronic)    Time: 9872-1587 PT Time Calculation (min) (ACUTE ONLY): 25 min   Charges:   PT Evaluation $PT Eval Moderate Complexity: 1 Mod          Candie Mile, PT, DPT Physical Therapist Acute Rehabilitation Services [facility]   Ellouise Newer 04/30/2022, 3:38 PM

## 2022-04-30 NOTE — Progress Notes (Signed)
PROGRESS NOTE    EXZAVIER RUDERMAN  KYH:[id number] DOB: 10-06-1927 DOA: 04/27/2022 PCP: [name], MD  Chief Complaint  Patient presents with   Chest Pain   Shortness of Breath    Brief Narrative:   SORREN VALLIER is Evan [name] 89 y.o. male with extensive medical history significant of VD, CKD 3A, radiation pneumonitis, stroke (thalamic hemorrhage) with residual hemiparesis, CAD status post CABG, PE, atrial fibrillation, hyperlipidemia, anxiety, depression, hypertension, lung cancer, HFrEF, pericarditis, GERD, COPD, BPH, dementia, and tremor who presents with complaints of progressively worsening shortness of breath over the last week.Marland Kitchen  History is obtained mostly from the patient's son who is present at bedside due to the patient having dementia.  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  Patient has had Aleric Froelich productive cough and orthopnea.  Family reports that they have been checking his oxygenation at home and noted that had been stable.   Patient's son notes that he had recently had Avagrace Botelho fall hitting the right side of his temple about 2 weeks ago.  Review of records note patient was supposed to be on enoxaparin lifelong after failing apixaban in 2019, but appears to have been switched to Xarelto after patient had an intracranial bleed in 2020.  Currently, appears to have been on Xarelto 15 mg which is the appropriate dose for atrial fibrillation, but not necessarily for history of VTE 20 mg daily.  Family note that the patient has been taking the Xarelto with food.   In the emergency department patient was noted to be afebrile with pulse 40-64, respirations 14-22, blood pressures are maintaining O2 saturations as low as 87% with improvement on 2 L of nasal cannula oxygen.  Labs from 1/24 significant for hemoglobin 8.6, sodium 132, BUN 24, creatinine 1.48,  high-sensitivity troponin 16->19, and BNP 683.2.  Chest x-ray was unremarkable.  Urinalysis did not show any  acute abnormality.  Respiratory virus panel screening were negative.  CT angiogram of the chest significant for small acute pulmonary embolus with in posterior segmental pulmonary artery of the right lower lobe with evidence of right heart strain that appear to be more chronic in nature, mild interstitial pulmonary edema with small bilateral pleural effusions, and consolidation in the posterior basal right lower lobe concerning for possible pulmonary infarction.  Patient has been given furosemide 40 mg IV and Joan Herschberger DuoNeb breathing treatment.   Assessment & Plan:   Principal Problem:   Acute respiratory failure with hypoxia (HCC) Active Problems:   HFmrEF (heart failure with mildly reduced ejection fraction) (HCC)   Acute pulmonary embolism (HCC)   History of pulmonary embolism   Elevated troponin   Persistent atrial fibrillation (HCC)   BPH (benign prostatic hyperplasia)   Hypokalemia  Chills  Rigors Focal Consolidation within Posterior Basal RLL He appeared to have rigors while I was in room CT did show findings concerning for focal consolidation, will treat for possible pneumonia - could be pulm infarct Follow blood cultures  Acute respiratory failure with hypoxia secondary to heart failure with reduced EF Acute on chronic.  Patient presented with complaints of worsening shortness of breath.  Found to be hypoxic down to 87% on room air with improvement on 2 L nasal cannula oxygen.  CT angiogram of the chest noted concern for mild pulmonary edema with small bilateral pleural effusions and Oden Lindaman pulmonary embolism.  Right heart strain was noted but thought to be more so chronic in nature.  BNP elevated at 683.2.  Last echocardiogram noted EF to be 45-50% with indeterminate diastolic parameters back in 04/2021.  Patient has been given Lasix 40 mg IV. -Strict I&O's and daily weights -CXR with mild edema, cardiomegaly -hold lasix and reevaluate in am -Check echocardiogram - EF 50-55%, mildly reduced  RVSF   Pulmonary embolism History of pulmonary embolism Acute.  Patient had Melford Tullier prior history of pulmonary embolism and had been on Xarelto for anticoagulation.  Records note that he had previously failed Eliquis back in 08/2018 and has been recommended to be on lifelong enoxaparin.  Family reports compliance with Xarelto, but he was on 15mg  daily and not the 20 recommended for VTE treatment. -Heparin drip per pharmacy -Case was discussed with Dr. Benay Spice of hematology oncology who recommended transitioning patient back to Lovenox subcutaneous, but upon further review it appears he suffered the thalamic hemorrhage while on Lovenox which was the likely cause of him being placed on Xarelto.  Will need to discuss further, discuss risks/benefits - not particularly straightforward.  Will need to discuss further with patient/family/hematology.   Elevated troponin Acute.  Patient had noted some complaints of chest pain.  High-sensitivity troponin was mildly elevated 16->19 but essentially flat.  EKG without significant ischemic changes.  Suspect secondary to demand in setting of above. -Continue to monitor   Persistent atrial fibrillation on chronic anticoagulation Patient noted to be in atrial fibrillation with slow ventricular response with heart rates documented in the 40- 50s.   -Goal potassium at least 4 and magnesium at least 2 -Anticoagulation as noted above   Essential hypertension Blood pressures currently maintained. -Held metoprolol due to low heart rates   CKD stage IIIb Creatinine 1.79 -Continue to monitor kidney function with diuresis   History of thalamic hemorrhage Patient had been on therapeutic Lovenox when he developed Hardie Veltre left thalamic intraparenchymal hemorrhage in 2020   History of lung cancer Prior history of adenocarcinoma of the right lung status post radiation back in 2017   Mood disorder/depression -Continue Prozac   Hyperlipidemia -Continue Crestor    BPH -Continue finasteride and tamsulosin   Stage I pressure ulcer coccyx -Continue pressure dressing Pressure Injury 04/28/22 Sacrum Mid Stage 1 -  Intact skin with non-blanchable redness of Anjeli Casad localized area usually over Yuya Vanwingerden bony prominence. (Active)  04/28/22 2030  Location: Sacrum  Location Orientation: Mid  Staging: Stage 1 -  Intact skin with non-blanchable redness of Javarius Tsosie localized area usually over Juandedios Dudash bony prominence.  Wound Description (Comments):   Present on Admission: Yes      DVT prophylaxis: heparin gtt Code Status: full Family Communication: daughter/son at bedside Disposition:   Status is: Inpatient Remains inpatient appropriate because: continued SOB   Consultants:  oncology  Procedures:  Echo IMPRESSIONS     1. Assessing wall motion is challenging. Left ventricular ejection  fraction, by estimation, is 50 to 55%. The left ventricle has low normal  function.   2. Right ventricular systolic function is mildly reduced. The right  ventricular size is moderately enlarged. There is moderately elevated  pulmonary artery systolic pressure.   3. Left atrial size was moderately dilated.   4. Trivial mitral valve regurgitation.   5. Aortic valve regurgitation is not visualized.   6. The inferior vena cava is normal in size with <50% respiratory  variability, suggesting right atrial pressure of 8 mmHg.   LE [place]  Summary:  BILATERAL:  - No evidence of deep vein thrombosis seen in the lower extremities,  bilaterally.  -  No evidence of popliteal cyst, bilaterally.   Antimicrobials:  Anti-infectives (From admission, onward)    Start     Dose/Rate Route Frequency Ordered Stop   04/29/22 1815  cefTRIAXone (ROCEPHIN) 1 g in sodium chloride 0.9 % 100 mL IVPB        1 g 200 mL/hr over 30 Minutes Intravenous Every 24 hours 04/29/22 1718 05/04/22 1814       Subjective: No new complaints Feels better  Objective: Vitals:   04/29/22 1713 04/29/22 1945 04/30/22 0420  04/30/22 1613  BP: (!) 106/58 (!) 110/53 (!) 118/54 124/64  Pulse: 69 74 64 72  Resp: 18 18 18 17   Temp: 98.7 F (37.1 C) 98.6 F (37 C) 98.9 F (37.2 C) 98.6 F (37 C)  TempSrc: Oral Oral Oral Axillary  SpO2: 95% 98% 98% 98%  Weight:   69 kg   Height:        Intake/Output Summary (Last 24 hours) at 04/30/2022 1642 Last data filed at 04/29/2022 2300 Gross per 24 hour  Intake 550.52 ml  Output 1400 ml  Net -849.48 ml   Filed Weights   04/28/22 2343 04/29/22 0345 04/30/22 0420  Weight: 67.9 kg 67 kg 69 kg    Examination:  General: No acute distress. Cardiovascular: Heart sounds show Avyn Coate regular rate, and rhythm.  Lungs: improved wob, CTAB Abdomen: Soft, nontender, nondistended  Neurological: Alert and oriented 3. Moves all extremities 4. Cranial nerves II through XII grossly intact. Extremities: No clubbing or cyanosis. No edema.   Data Reviewed: I have personally reviewed following labs and imaging studies  CBC: Recent Labs  Lab 04/27/22 1643 04/29/22 0710 04/29/22 1501 04/30/22 0129  WBC 5.8 5.2  --  5.0  NEUTROABS 3.6  --   --   --   HGB 8.6* 7.7* 7.8* 7.7*  HCT 26.7* 23.8* 23.9* 23.8*  MCV 95.0 91.5  --  91.9  PLT 115* 104*  --  87*    Basic Metabolic Panel: Recent Labs  Lab 04/27/22 1643 04/29/22 0551 04/30/22 0129  NA 132* 134* 135  K 4.1 3.8 3.7  CL 103 105 102  CO2 20* 21* 22  GLUCOSE 103* 98 110*  BUN 24* 23 25*  CREATININE 1.48* 1.48* 1.79*  CALCIUM 8.7* 8.5* 8.6*  MG  --  2.0 2.0  PHOS  --   --  4.7*    GFR: Estimated Creatinine Clearance: 23 mL/min (Zyrah Wiswell) (by C-G formula based on SCr of 1.79 mg/dL (H)).  Liver Function Tests: Recent Labs  Lab 04/30/22 0129  AST 16  ALT 8  ALKPHOS 86  BILITOT 0.6  PROT 6.4*  ALBUMIN 3.1*    CBG: No results for input(s): "GLUCAP" in the last 168 hours.   Recent Results (from the past 240 hour(s))  Resp panel by RT-PCR (RSV, Flu Ticia Virgo&B, Covid) Anterior Nasal Swab     Status: None   Collection  Time: 04/28/22  1:53 AM   Specimen: Anterior Nasal Swab  Result Value Ref Range Status   SARS Coronavirus 2 by RT PCR NEGATIVE NEGATIVE Final    Comment: (NOTE) SARS-CoV-2 target nucleic acids are NOT DETECTED.  The SARS-CoV-2 RNA is generally detectable in upper respiratory specimens during the acute phase of infection. The lowest concentration of SARS-CoV-2 viral copies this assay can detect is 138 copies/mL. Bao Coreas negative result does not preclude SARS-Cov-2 infection and should not be used as the sole basis for treatment or other patient management decisions. Chanz Cahall negative result may occur  with  improper specimen collection/handling, submission of specimen other than nasopharyngeal swab, presence of viral mutation(s) within the areas targeted by this assay, and inadequate number of viral copies(<138 copies/mL). [place] negative result must be combined with clinical observations, patient history, and epidemiological information. The expected result is Negative.  Fact Sheet for Patients:  EntrepreneurPulse.com.au  Fact Sheet for Healthcare Providers:  IncredibleEmployment.be  This test is no t yet approved or cleared by the [place] FDA and  has been authorized for detection and/or diagnosis of SARS-CoV-2 by FDA under an Emergency Use Authorization (EUA). This EUA will remain  in effect (meaning this test can be used) for the duration of the COVID-19 declaration under Section 564(b)(1) of the Act, 21 U.S.C.section 360bbb-3(b)(1), unless the authorization is terminated  or revoked sooner.       Influenza Shray Hunley by PCR NEGATIVE NEGATIVE Final   Influenza B by PCR NEGATIVE NEGATIVE Final    Comment: (NOTE) The Xpert Xpress SARS-CoV-2/FLU/RSV plus assay is intended as an aid in the diagnosis of influenza from Nasopharyngeal swab specimens and should not be used as Kenith Trickel sole basis for treatment. Nasal washings and aspirates are unacceptable for Xpert Xpress  SARS-CoV-2/FLU/RSV testing.  Fact Sheet for Patients: EntrepreneurPulse.com.au  Fact Sheet for Healthcare Providers: IncredibleEmployment.be  This test is not yet approved or cleared by the [place] FDA and has been authorized for detection and/or diagnosis of SARS-CoV-2 by FDA under an Emergency Use Authorization (EUA). This EUA will remain in effect (meaning this test can be used) for the duration of the COVID-19 declaration under Section 564(b)(1) of the Act, 21 U.S.C. section 360bbb-3(b)(1), unless the authorization is terminated or revoked.     Resp Syncytial Virus by PCR NEGATIVE NEGATIVE Final    Comment: (NOTE) Fact Sheet for Patients: EntrepreneurPulse.com.au  Fact Sheet for Healthcare Providers: IncredibleEmployment.be  This test is not yet approved or cleared by the [place] FDA and has been authorized for detection and/or diagnosis of SARS-CoV-2 by FDA under an Emergency Use Authorization (EUA). This EUA will remain in effect (meaning this test can be used) for the duration of the COVID-19 declaration under Section 564(b)(1) of the Act, 21 U.S.C. section 360bbb-3(b)(1), unless the authorization is terminated or revoked.  Performed at [facility], [place] [address] [place], [place] [address]   Culture, blood (Routine X 2) w Reflex to ID Panel     Status: None (Preliminary result)   Collection Time: 04/29/22  3:03 PM   Specimen: BLOOD LEFT WRIST  Result Value Ref Range Status   Specimen Description BLOOD LEFT WRIST  Final   Special Requests   Final    BOTTLES DRAWN AEROBIC AND ANAEROBIC Blood Culture adequate volume   Culture   Final    NO GROWTH < 24 HOURS Performed at [facility], [place] [address], [place], [place] [address]    Report Status PENDING  Incomplete  Culture, blood (Routine X 2) w Reflex to ID Panel     Status: None (Preliminary result)   Collection Time:  04/29/22  3:03 PM   Specimen: BLOOD RIGHT HAND  Result Value Ref Range Status   Specimen Description BLOOD RIGHT HAND  Final   Special Requests   Final    BOTTLES DRAWN AEROBIC AND ANAEROBIC Blood Culture results may not be optimal due to an inadequate volume of blood received in culture bottles   Culture   Final    NO GROWTH < 24 HOURS Performed at [facility], [place] [address], [place], [place] [address]    Report Status PENDING  Incomplete         Radiology Studies: DG CHEST PORT 1 VIEW  Result Date: 04/30/2022 CLINICAL DATA:  Shortness of breath EXAM: PORTABLE CHEST 1 VIEW COMPARISON:  Chest x-ray April 27, 2022 FINDINGS: Stable cardiomegaly. Sternotomy wires are intact. The hila and mediastinum is stable. No pneumothorax. Increased interstitial markings bilaterally have worsened mildly. No other interval changes or acute abnormalities. IMPRESSION: Findings are most consistent with cardiomegaly and mild pulmonary edema. Electronically Signed   By: Dorise Bullion III M.D.   On: 04/30/2022 12:57   VAS [place] LOWER EXTREMITY VENOUS (DVT)  Result Date: 04/29/2022  Lower Venous DVT Study Patient Name:  IZIAH CATES  Date of Exam:   04/29/2022 Medical Rec #: [id number]     Accession #:    [id number] Date of Birth: [birth year]     Patient Gender: M Patient Age:   47 years Exam Location:  [facility] Procedure:      VAS [place] LOWER EXTREMITY VENOUS (DVT) Referring Phys: Aminata Buffalo POWELL JR --------------------------------------------------------------------------------  Indications: History of PE.  Comparison Study: 01/27/20 Negative LEV Performing Technologist: Velva Harman Sturdivant RDMS, RVT  Examination Guidelines: Youa Deloney complete evaluation includes B-mode imaging, spectral Doppler, color Doppler, and power Doppler as needed of all accessible portions of each vessel. Bilateral testing is considered an integral part of Shatarra Wehling complete examination. Limited examinations for reoccurring indications may be performed  as noted. The reflux portion of the exam is performed with the patient in reverse Trendelenburg.  +---------+---------------+---------+-----------+----------+--------------+ RIGHT    CompressibilityPhasicitySpontaneityPropertiesThrombus Aging +---------+---------------+---------+-----------+----------+--------------+ CFV      Full           Yes      Yes                                 +---------+---------------+---------+-----------+----------+--------------+ SFJ      Full                                                        +---------+---------------+---------+-----------+----------+--------------+ FV Prox  Full                                                        +---------+---------------+---------+-----------+----------+--------------+ FV Mid   Full                                                        +---------+---------------+---------+-----------+----------+--------------+ FV DistalFull                                                        +---------+---------------+---------+-----------+----------+--------------+ PFV      Full                                                        +---------+---------------+---------+-----------+----------+--------------+  POP      Full           Yes      Yes                                 +---------+---------------+---------+-----------+----------+--------------+ PTV      Full                                                        +---------+---------------+---------+-----------+----------+--------------+ PERO     Full                                                        +---------+---------------+---------+-----------+----------+--------------+   +---------+---------------+---------+-----------+----------+--------------+ LEFT     CompressibilityPhasicitySpontaneityPropertiesThrombus Aging +---------+---------------+---------+-----------+----------+--------------+ CFV      Full            Yes      Yes                                 +---------+---------------+---------+-----------+----------+--------------+ SFJ      Full                                                        +---------+---------------+---------+-----------+----------+--------------+ FV Prox  Full                                                        +---------+---------------+---------+-----------+----------+--------------+ FV Mid   Full                                                        +---------+---------------+---------+-----------+----------+--------------+ FV DistalFull                                                        +---------+---------------+---------+-----------+----------+--------------+ PFV      Full                                                        +---------+---------------+---------+-----------+----------+--------------+ POP      Full           Yes      Yes                                 +---------+---------------+---------+-----------+----------+--------------+  PTV      Full                                                        +---------+---------------+---------+-----------+----------+--------------+ PERO     Full                                                        +---------+---------------+---------+-----------+----------+--------------+     Summary: BILATERAL: - No evidence of deep vein thrombosis seen in the lower extremities, bilaterally. -No evidence of popliteal cyst, bilaterally.   *See table(s) above for measurements and observations.    Preliminary    ECHOCARDIOGRAM LIMITED  Result Date: 04/28/2022    ECHOCARDIOGRAM LIMITED REPORT   Patient Name:   JASANI DOLNEY Date of Exam: 04/28/2022 Medical Rec #:  [id number]    Height:       63.0 in Accession #:    [id number]   Weight:       170.0 lb Date of Birth:  04/16/1927    BSA:          1.805 m Patient Age:    81 years     BP:           124/59 mmHg Patient Gender: M             HR:           60 bpm. Exam Location:  Inpatient Procedure: Limited Echo, Cardiac Doppler and Limited Color Doppler Indications:    CHF-Acute Systolic M01.02  History:        Patient has prior history of Echocardiogram examinations, most                 recent 04/13/2021. CAD, Aortic Valve Disease, Arrythmias:Atrial                 Fibrillation, Signs/Symptoms:Shortness of Breath; Risk                 Factors:Non-Smoker, Hypertension and Dyslipidemia.  Sonographer:    Greer Pickerel Referring Phys: [id number] RONDELL Arely Tinner SMITH  Sonographer Comments: Image acquisition challenging due to patient body habitus and Image acquisition challenging due to respiratory motion. IMPRESSIONS  1. Assessing wall motion is challenging. Left ventricular ejection fraction, by estimation, is 50 to 55%. The left ventricle has low normal function.  2. Right ventricular systolic function is mildly reduced. The right ventricular size is moderately enlarged. There is moderately elevated pulmonary artery systolic pressure.  3. Left atrial size was moderately dilated.  4. Trivial mitral valve regurgitation.  5. Aortic valve regurgitation is not visualized.  6. The inferior vena cava is normal in size with <50% respiratory variability, suggesting right atrial pressure of 8 mmHg. FINDINGS  Left Ventricle: Assessing wall motion is challenging. Left ventricular ejection fraction, by estimation, is 50 to 55%. The left ventricle has low normal function. There is no left ventricular hypertrophy. Right Ventricle: The right ventricular size is moderately enlarged. Right ventricular systolic function is mildly reduced. There is moderately elevated pulmonary artery systolic pressure. The tricuspid regurgitant velocity is 2.93 m/s, and with an assumed right atrial pressure of 15 mmHg, the estimated right ventricular systolic  pressure is 49.3 mmHg. Left Atrium: Left atrial size was moderately dilated. Right Atrium: Right atrial size was normal in size. Mitral  Valve: Trivial mitral valve regurgitation. Tricuspid Valve: Tricuspid valve regurgitation is mild. Aortic Valve: Aortic valve regurgitation is not visualized. Pulmonic Valve: Pulmonic valve regurgitation is not visualized. Venous: The inferior vena cava is normal in size with less than 50% respiratory variability, suggesting right atrial pressure of 8 mmHg. Additional Comments: Spectral Doppler performed. Color Doppler performed.  LEFT VENTRICLE PLAX 2D LVIDd:         3.80 cm LVIDs:         3.00 cm LV PW:         1.00 cm LV IVS:        0.90 cm  MITRAL VALVE               TRICUSPID VALVE MV Area (PHT): 6.32 cm    TR Peak grad:   34.3 mmHg MV Decel Time: 120 msec    TR Vmax:        293.00 cm/s MV E velocity: 92.20 cm/s MV Jenner Rosier velocity: 37.80 cm/s MV E/Zaiah Eckerson ratio:  2.44 Landscape architect signed by Phineas Inches Signature Date/Time: 04/28/2022/5:35:41 PM    Final         Scheduled Meds:  finasteride  5 mg Oral Daily   FLUoxetine  20 mg Oral q AM   furosemide  40 mg Intravenous Daily   rosuvastatin  5 mg Oral Q supper   sodium chloride flush  3 mL Intravenous Q12H   tamsulosin  0.4 mg Oral Q supper   Continuous Infusions:  cefTRIAXone (ROCEPHIN)  IV 200 mL/hr at 04/29/22 1900   heparin 750 Units/hr (04/29/22 1900)     LOS: 2 days    Time spent: over 30 min    Fayrene Helper, MD [facility]   To contact the attending provider between 7A-7P or the covering provider during after hours 7P-7A, please log into the web site [email or web address] and access using universal [place] password for that web site. If you do not have the password, please call the hospital operator.  04/30/2022, 4:42 PM

## 2022-04-30 NOTE — Progress Notes (Signed)
ANTICOAGULATION CONSULT NOTE-Follow Up  Pharmacy Consult for Heparin infusion  Indication: pulmonary embolus  Allergies  Allergen Reactions   Feldene [Piroxicam] Rash    Blistering rash   Neomycin-Polymyxin-Hc Itching   Statins Other (See Comments)    Myalgias   Valium [Diazepam] Other (See Comments)    Dizziness    Latex Rash   Tequin [Gatifloxacin] Anxiety   Vibra-Tab [Doxycycline] Nausea Only    Patient Measurements: Height: 5\' 3"  (160 cm) Weight: 69 kg (152 lb 1.9 oz) IBW/kg (Calculated) : 56.9 Heparin Dosing Weight: 72.9 kg  Vital Signs: Temp: 98.9 F (37.2 C) (01/27 0420) Temp Source: Oral (01/27 0420) BP: 118/54 (01/27 0420) Pulse Rate: 64 (01/27 0420)  Labs: Recent Labs    04/27/22 1643 04/27/22 1643 04/27/22 1829 04/28/22 1808 04/29/22 0551 04/29/22 0710 04/29/22 1501 04/29/22 1824 04/30/22 0129  HGB 8.6*  --   --   --   --  7.7* 7.8*  --  7.7*  HCT 26.7*  --   --   --   --  23.8* 23.9*  --  23.8*  PLT 115*  --   --   --   --  104*  --   --  87*  APTT  --    < >  --  >200*  --  178*  --  74* 77*  HEPARINUNFRC  --   --   --  >1.10*  --  0.95*  --   --  0.43  CREATININE 1.48*  --   --   --  1.48*  --   --   --  1.79*  TROPONINIHS 16  --  19*  --   --   --   --   --   --    < > = values in this interval not displayed.     Estimated Creatinine Clearance: 23 mL/min (A) (by C-G formula based on SCr of 1.79 mg/dL (H)).   Medical History: Past Medical History:  Diagnosis Date   Adenocarcinoma of right lung ([place]) 01/06/2016   Anginal chest pain at rest    Chronic non, controlled on Lorazepam   Anxiety    Arthritis    "all over"    BPH (benign prostatic hyperplasia)    CAD (coronary artery disease)    Chronic lower back pain    Complication of anesthesia    "problems making water afterwards"   Depression    DJD (degenerative joint disease)    Esophageal ulcer without bleeding    bid ppi indefinitely   Family history of adverse reaction to  anesthesia    "children w/PONV"   GERD (gastroesophageal reflux disease)    Headache    "couple/week maybe" (09/04/2015)   Hemochromatosis    Possible, elevated iron stores, hook-like osteophytes on hand films, normal LFTs   Hepatitis    "yellow jaundice as a baby"   History of ASCVD    MULTIVESSEL   Hyperlipidemia    Hypertension    Mild aortic stenosis 06/23/2021   Osteoarthritis    Pneumonia    "several times; got a little now" (09/04/2015)   Rheumatoid arthritis ([place])    Subdural hematoma ([place])    small after fall 09/2016-plavix held, neurosurgery consulted.  Asymptomatic.     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[place]) 02/2018   on Lovenox lifelong since he failed PO Eliquis    Medications:  Medications Prior to Admission  Medication Sig Dispense Refill Last Dose   acetaminophen (TYLENOL) 500 MG tablet Take 500  mg by mouth every 6 (six) hours as needed for mild pain, moderate pain, fever or headache.   04/27/2022   Cholecalciferol (VITAMIN D-3 PO) Take 1 capsule by mouth in the morning.   04/27/2022   finasteride (PROSCAR) 5 MG tablet Take 1 tablet (5 mg total) by mouth daily. 90 tablet 3 04/27/2022   FLUoxetine (PROZAC) 20 MG capsule TAKE 1 CAPSULE EVERY DAY (Patient taking differently: Take 20 mg by mouth in the morning.) 90 capsule 0 04/27/2022   furosemide (LASIX) 20 MG tablet Take 1 tablet (20 mg total) by mouth daily. (Patient taking differently: Take 20 mg by mouth in the morning.) 90 tablet 3 04/27/2022   Lifitegrast (XIIDRA) 5 % SOLN Place 1 drop into both eyes 2 (two) times daily. (Patient taking differently: Place 1 drop into both eyes 2 (two) times daily as needed (dry eyes).) 60 each 12 Past Month   metoprolol tartrate (LOPRESSOR) 25 MG tablet TAKE 1/2 TABLET TWICE DAILY (Patient taking differently: Take 12.5 mg by mouth daily.) 90 tablet 3 04/27/2022 at 0800   nitroGLYCERIN (NITROSTAT) 0.4 MG SL tablet Place 1 tablet (0.4 mg total) under the tongue every 5 (five) minutes as needed for chest pain.  25 tablet 2 UNK   pantoprazole (PROTONIX) 40 MG tablet Take 1 tablet (40 mg total) by mouth in the morning. 90 tablet 0 04/27/2022   polyethylene glycol (MIRALAX / GLYCOLAX) 17 g packet Take 17 g by mouth daily as needed for mild constipation. 14 each 0 Past Month   rosuvastatin (CRESTOR) 5 MG tablet TAKE 1 TABLET EVERY DAY (Patient taking differently: Take 5 mg by mouth daily with supper.) 90 tablet 1 04/26/2022   tamsulosin (FLOMAX) 0.4 MG CAPS capsule TAKE 1 CAPSULE EVERY DAY (Patient taking differently: Take 0.4 mg by mouth daily with supper.) 90 capsule 10 04/26/2022   XARELTO 15 MG TABS tablet TAKE 1 TABLET EVERY DAY WITH SUPPER (Patient taking differently: Take 15 mg by mouth daily with supper.) 30 tablet 10 04/26/2022 at 1800   ZINC OXIDE EX Apply 1 application  topically 4 (four) times daily as needed (skin barrier).   Past Month    Assessment: 89 yo M with history of atrial fibrillation and VTE on rivaroxaban 15mg  every evening with dinner. Last dose of rivaroxaban was 04/27/22 evening. CT PE positive for small acute PE in subsegmental pulomonary artery of RLL with evidence of chronic right heart strain. Pharmacy consulted to initiate and dose heparin for acute PE.   aPTT came back therapeutic at 74 with heparin level correlating at 0.43.  Hgb low, stable in the 7's and plts low 104>87. No signs/symptoms of bleeding documented. We will cont with current rate and check levels daily  Goal of Therapy:  Heparin level 0.3-0.7 units/ml aPTT 66-102 seconds Monitor platelets by anticoagulation protocol: Yes   Plan:  Cont heparin 750 units/h Monitor heparin levels since aPTT and heparin levels correlating Monitor signs/symptoms of bleeding   Sandford Craze, PharmD. [facility] Acute [facility] PGY-1  04/30/2022 7:07 AM

## 2022-04-30 NOTE — Evaluation (Signed)
Occupational Therapy Evaluation Patient Details Name: Evan [name] MRN: [id number] DOB: 1928-03-21 Today's Date: 04/30/2022   History of Present Illness 89 y.o. male admitted 1/24 with  Acute respiratory failure with hypoxia, found to have acute pulmonary embolism. 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 Pt uses bilateral platform RW for mobility at home, with family providing guarded assist with use of gait belt. Pt lives alone, but family rotates to provide pt with 24/7 assist. Pt currently needing min-max A for ADLs, min A +2 for bed mobility,and min A +2 for step pivot transfers with HHA. Pt presenting with impairments listed below, will follow acutely. Recommend HHOT at d/c if pt's family continues to provide 24/7 assist.     Recommendations for follow up therapy are one component of a multi-disciplinary discharge planning process, led by the attending physician.  Recommendations may be updated based on patient status, additional functional criteria and insurance authorization.   Follow Up Recommendations  Home health OT (if family continues to provide 24/7 assistance)     Assistance Recommended at Discharge Frequent or constant Supervision/Assistance  Patient can return home with the following A lot of help with walking and/or transfers;A lot of help with bathing/dressing/bathroom;Assistance with cooking/housework;Direct supervision/assist for medications management;Direct supervision/assist for financial management;Assist for transportation;Help with stairs or ramp for entrance    Functional Status Assessment  Patient has had a recent decline in their  functional status and demonstrates the ability to make significant improvements in function in a reasonable and predictable amount of time.  Equipment Recommendations  None recommended by OT (pt has all needed DME)    Recommendations for Other Services PT consult     Precautions / Restrictions Precautions Precautions: Fall Precaution Comments: residual R hemiparesis Restrictions Weight Bearing Restrictions: No      Mobility Bed Mobility Overal bed mobility: Needs Assistance Bed Mobility: Rolling, Sidelying to Sit, Supine to Sit Rolling: Min assist Sidelying to sit: Min assist, HOB elevated Supine to sit: Min assist, +2 for physical assistance          Transfers Overall transfer level: Needs assistance Equipment used: 2 person hand held assist Transfers: Sit to/from Stand, Bed to chair/wheelchair/BSC Sit to Stand: Min assist, +2 physical assistance     Step pivot transfers: Min assist, +2 physical assistance            Balance Overall balance assessment: Needs assistance Sitting-balance support: Single extremity supported, Feet supported Sitting balance-[name] Scale: Fair Sitting balance - Comments: can reach minimally outside BOS without LOB   Standing balance support: During functional activity, Bilateral upper extremity supported Standing balance-[name] Scale: Poor Standing balance comment: reliant on external support                           ADL either performed or assessed with clinical judgement   ADL Overall ADL's : Needs assistance/impaired Eating/Feeding: Sitting;Minimal assistance   Grooming: Sitting;Wash/dry face;Set up   Upper Body Bathing: Moderate assistance   Lower Body Bathing: Maximal assistance   Upper Body Dressing : Moderate assistance   Lower Body Dressing: Maximal assistance Lower Body Dressing Details (indicate cue type and reason):  pulling up socks seated EOB Toilet Transfer: Stand-pivot;BSC/3in1;Minimal  assistance Toilet Transfer Details (indicate cue type and reason): simulated to chair         Functional mobility during ADLs: Minimal assistance       Vision Baseline Vision/History: 1 Wears glasses Vision Assessment?: No apparent visual deficits     Perception Perception Perception Tested?: No   Praxis Praxis Praxis tested?: Not tested    Pertinent Vitals/Pain Pain Assessment Pain Assessment: No/denies pain     Hand Dominance Right (per report)   Extremity/Trunk Assessment Upper Extremity Assessment Upper Extremity Assessment: RUE deficits/detail RUE Deficits / Details: uncoordinated movement, RUE shoulder flex ~90*, can flex/ext elbow, 2/5 grasp RUE Coordination: decreased fine motor;decreased gross motor   Lower Extremity Assessment Lower Extremity Assessment: Defer to PT evaluation   Cervical / Trunk Assessment Cervical / Trunk Assessment: Kyphotic   Communication Communication Communication: HOH   Cognition Arousal/Alertness: Awake/alert Behavior During Therapy: WFL for tasks assessed/performed Overall Cognitive Status: History of cognitive impairments - at baseline                                 General Comments: needs increased cues/ repetition to follow commands, hx of dementia     General Comments  VSS on 1-2L O2; pt's daughter present    Exercises     Shoulder Instructions      Home Living Family/patient expects to be discharged to:: Private residence Living Arrangements: Alone (family rotates to stay with him around the clock) Available Help at Discharge: Family;Available 24 hours/day Type of Home: House Home Access: Ramped entrance     Home Layout: Two level;Able to live on main level with bedroom/bathroom     Bathroom Shower/Tub: Hospital doctor Toilet: Handicapped height Bathroom Accessibility: No   Home Equipment: Conservation officer, nature (2 wheels);Hospital bed;Wheelchair - manual;BSC/3in1;Shower seat           Prior Functioning/Environment Prior Level of Function : Needs assist             Mobility Comments: walks with RW and gait belt/min guard; ~169ft a few times/day ADLs Comments: assist for all ADLs        OT Problem List: Decreased strength;Decreased activity tolerance;Decreased range of motion;Impaired balance (sitting and/or standing);Decreased cognition;Decreased safety awareness;Cardiopulmonary status limiting activity;Impaired UE functional use      OT Treatment/Interventions: Self-care/ADL training;Therapeutic exercise;Energy conservation;DME and/or AE instruction;Therapeutic activities;Patient/family education;Balance training;Cognitive remediation/compensation    OT Goals(Current goals can be found in the care plan section) Acute Rehab OT Goals Patient Stated Goal: none stated OT Goal Formulation: With patient Time For Goal Achievement: 05/14/22 Potential to Achieve Goals: Good  OT Frequency: Min 2X/week    Co-evaluation PT/OT/SLP Co-Evaluation/Treatment: Yes Reason for Co-Treatment: To address functional/ADL transfers;For patient/therapist safety;Complexity of the patient's impairments (multi-system involvement) PT goals addressed during session: Mobility/safety with mobility;Balance OT goals addressed during session: ADL's and self-care;Strengthening/ROM      AM-PAC OT "6 Clicks" Daily Activity     Outcome Measure Help from another person eating meals?: A Little Help from another person taking care of personal grooming?: A Little Help from another person toileting, which includes using toliet, bedpan, or urinal?: A Lot Help from another person bathing (including washing, rinsing, drying)?: A Lot Help from another person to put on and taking off regular upper body clothing?: A Lot Help from another person to put on and taking off regular lower body clothing?: A  Lot 6 Click Score: 14   End of Session Equipment Utilized During Treatment: Gait belt;Oxygen Nurse  Communication: Mobility status  Activity Tolerance: Patient tolerated treatment well Patient left: in bed;with call bell/phone within reach;with bed alarm set;with family/visitor present  OT Visit Diagnosis: Muscle weakness (generalized) (M62.81);Unsteadiness on feet (R26.81);Other abnormalities of gait and mobility (R26.89)                Time: 3643-8377 OT Time Calculation (min): 25 min Charges:  OT General Charges $OT Visit: 1 Visit OT Evaluation $OT Eval Moderate Complexity: 1 Mod  Kyaira Trantham K, OTD, OTR/L [employer] Preferred Acute Rehab [phone]  Kristan Votta K Koonce 04/30/2022, 4:30 PM

## 2022-05-01 ENCOUNTER — Inpatient Hospital Stay (HOSPITAL_COMMUNITY): Payer: Medicare HMO

## 2022-05-01 DIAGNOSIS — J9601 Acute respiratory failure with hypoxia: Secondary | ICD-10-CM | POA: Diagnosis not present

## 2022-05-01 LAB — CBC
HCT: 25 % — ABNORMAL LOW (ref 39.0–52.0)
Hemoglobin: 8.2 g/dL — ABNORMAL LOW (ref 13.0–17.0)
MCH: 30 pg (ref 26.0–34.0)
MCHC: 32.8 g/dL (ref 30.0–36.0)
MCV: 91.6 fL (ref 80.0–100.0)
Platelets: 97 10*3/uL — ABNORMAL LOW (ref 150–400)
RBC: 2.73 MIL/uL — ABNORMAL LOW (ref 4.22–5.81)
RDW: 14.3 % (ref 11.5–15.5)
WBC: 5.8 10*3/uL (ref 4.0–10.5)
nRBC: 0 % (ref 0.0–0.2)

## 2022-05-01 LAB — BRAIN NATRIURETIC PEPTIDE: B Natriuretic Peptide: 176.7 pg/mL — ABNORMAL HIGH (ref 0.0–100.0)

## 2022-05-01 LAB — BASIC METABOLIC PANEL
Anion gap: 10 (ref 5–15)
BUN: 29 mg/dL — ABNORMAL HIGH (ref 8–23)
CO2: 21 mmol/L — ABNORMAL LOW (ref 22–32)
Calcium: 8.8 mg/dL — ABNORMAL LOW (ref 8.9–10.3)
Chloride: 103 mmol/L (ref 98–111)
Creatinine, Ser: 1.76 mg/dL — ABNORMAL HIGH (ref 0.61–1.24)
GFR, Estimated: 36 mL/min — ABNORMAL LOW (ref >60)
Glucose, Bld: 111 mg/dL — ABNORMAL HIGH (ref 70–99)
Potassium: 3.9 mmol/L (ref 3.5–5.1)
Sodium: 134 mmol/L — ABNORMAL LOW (ref 135–145)

## 2022-05-01 LAB — HEPARIN LEVEL (UNFRACTIONATED): Heparin Unfractionated: 0.32 IU/mL (ref 0.30–0.70)

## 2022-05-01 MED ORDER — MELATONIN 3 MG PO TABS
3.0000 mg | ORAL_TABLET | Freq: Every evening | ORAL | Status: DC | PRN
  Administered 2022-05-01: 3 mg via ORAL
  Filled 2022-05-01: qty 1

## 2022-05-01 MED ORDER — HYDROCORTISONE 1 % EX CREA
TOPICAL_CREAM | Freq: Two times a day (BID) | CUTANEOUS | Status: DC
  Filled 2022-05-01: qty 28

## 2022-05-01 NOTE — Progress Notes (Signed)
PROGRESS NOTE    FINNBAR CEDILLOS  POE:[id number] DOB: [birth year] DOA: 04/27/2022 PCP: [name], MD  Chief Complaint  Patient presents with   Chest Pain   Shortness of Breath    Brief Narrative:   Evan [name] is Chrystopher Stangl 89 y.o. male with extensive medical history significant of VD, CKD 3A, radiation pneumonitis, stroke (thalamic hemorrhage) with residual hemiparesis, CAD status post CABG, PE, atrial fibrillation, hyperlipidemia, anxiety, depression, hypertension, lung cancer, HFrEF, pericarditis, GERD, COPD, BPH, dementia, and tremor who presents with complaints of progressively worsening shortness of breath over the last week.Marland Kitchen  History is obtained mostly from the patient's son who is present at bedside due to the patient having dementia.  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  Patient has had Taveon Enyeart productive cough and orthopnea.  Family reports that they have been checking his oxygenation at home and noted that had been stable.   Patient's son notes that he had recently had Kohan Azizi fall hitting the right side of his temple about 2 weeks ago.  Review of records note patient was supposed to be on enoxaparin lifelong after failing apixaban in 2019, but appears to have been switched to Xarelto after patient had an intracranial bleed in 2020.  Currently, appears to have been on Xarelto 15 mg which is the appropriate dose for atrial fibrillation, but not necessarily for history of VTE 20 mg daily.  Family note that the patient has been taking the Xarelto with food.   In the emergency department patient was noted to be afebrile with pulse 40-64, respirations 14-22, blood pressures are maintaining O2 saturations as low as 87% with improvement on 2 L of nasal cannula oxygen.  Labs from 1/24 significant for hemoglobin 8.6, sodium 132, BUN 24, creatinine 1.48,  high-sensitivity troponin 16->19, and BNP 683.2.  Chest x-ray was unremarkable.  Urinalysis did not show any  acute abnormality.  Respiratory virus panel screening were negative.  CT angiogram of the chest significant for small acute pulmonary embolus with in posterior segmental pulmonary artery of the right lower lobe with evidence of right heart strain that appear to be more chronic in nature, mild interstitial pulmonary edema with small bilateral pleural effusions, and consolidation in the posterior basal right lower lobe concerning for possible pulmonary infarction.  Patient has been given furosemide 40 mg IV and Mendi Constable DuoNeb breathing treatment.   Assessment & Plan:   Principal Problem:   Acute respiratory failure with hypoxia (HCC) Active Problems:   HFmrEF (heart failure with mildly reduced ejection fraction) (HCC)   Acute pulmonary embolism (HCC)   History of pulmonary embolism   Elevated troponin   Persistent atrial fibrillation (HCC)   BPH (benign prostatic hyperplasia)   Hypokalemia  Chills  Rigors Focal Consolidation within Posterior Basal RLL improving CT did show findings concerning for focal consolidation, will treat for possible pneumonia - could be pulm infarct Follow blood cultures  Acute respiratory failure with hypoxia secondary to heart failure with reduced EF Acute on chronic.  Patient presented with complaints of worsening shortness of breath.  Found to be hypoxic down to 87% on room air with improvement on 2 L nasal cannula oxygen.  CT angiogram of the chest noted concern for mild pulmonary edema with small bilateral pleural effusions and Sufian Ravi pulmonary embolism.  Right heart strain was noted but thought to be more so chronic in nature. BNP elevated at 683.2.  Last echocardiogram noted EF  to be 45-50% with indeterminate diastolic parameters back in 04/2021.  Patient has been given Lasix 40 mg IV. -Strict I&O's and daily weights -CXR with mild edema, cardiomegaly -CXR with no active disease -hold lasix again today, will start oral tomorrow if renal functio improving -Check  echocardiogram - EF 50-55%, mildly reduced RVSF   Pulmonary embolism History of pulmonary embolism Acute.  Patient had Rmani Kapusta prior history of pulmonary embolism and had been on Xarelto for anticoagulation.  Records note that he had previously failed Eliquis back in 08/2018 and has been recommended to be on lifelong enoxaparin.  Family reports compliance with Xarelto, but he was on 15mg  daily and not the 20 recommended for VTE treatment. -Heparin drip per pharmacy -appreciate hematology recs - Dr. [name] recommending eliquis at discharge.  Complex situation overall, not Jacky Hartung perfect solution.  Appreciate hematology's assistance.   Elevated troponin Acute.  Patient had noted some complaints of chest pain.  High-sensitivity troponin was mildly elevated 16->19 but essentially flat.  EKG without significant ischemic changes.  Suspect secondary to demand in setting of above. -Continue to monitor   Persistent atrial fibrillation on chronic anticoagulation -Goal potassium at least 4 and magnesium at least 2 -Anticoagulation as noted above   Essential hypertension Blood pressures currently maintained. -Held metoprolol due to low heart rates   CKD stage IIIb Creatinine 1.76 today -Continue to monitor kidney function with diuresis   History of thalamic hemorrhage Patient had been on therapeutic Lovenox when he developed Zerina Hallinan left thalamic intraparenchymal hemorrhage in 2020   History of lung cancer Prior history of adenocarcinoma of the right lung status post radiation back in 2017   Mood disorder/depression -Continue Prozac   Hyperlipidemia -Continue Crestor   BPH -Continue finasteride and tamsulosin   Stage I pressure ulcer coccyx -Continue pressure dressing Pressure Injury 04/28/22 Sacrum Mid Stage 1 -  Intact skin with non-blanchable redness of Sabah Zucco localized area usually over Kynzee Devinney bony prominence. (Active)  04/28/22 2030  Location: Sacrum  Location Orientation: Mid  Staging: Stage 1 -  Intact  skin with non-blanchable redness of Jabron Weese localized area usually over Marvelene Stoneberg bony prominence.  Wound Description (Comments):   Present on Admission: Yes      DVT prophylaxis: heparin gtt Code Status: full Family Communication: daughter/son at bedside Disposition:   Status is: Inpatient Remains inpatient appropriate because: continued SOB   Consultants:  oncology  Procedures:  Echo IMPRESSIONS     1. Assessing wall motion is challenging. Left ventricular ejection  fraction, by estimation, is 50 to 55%. The left ventricle has low normal  function.   2. Right ventricular systolic function is mildly reduced. The right  ventricular size is moderately enlarged. There is moderately elevated  pulmonary artery systolic pressure.   3. Left atrial size was moderately dilated.   4. Trivial mitral valve regurgitation.   5. Aortic valve regurgitation is not visualized.   6. The inferior vena cava is normal in size with <50% respiratory  variability, suggesting right atrial pressure of 8 mmHg.   LE [place]  Summary:  BILATERAL:  - No evidence of deep vein thrombosis seen in the lower extremities,  bilaterally.  -No evidence of popliteal cyst, bilaterally.   Antimicrobials:  Anti-infectives (From admission, onward)    Start     Dose/Rate Route Frequency Ordered Stop   04/29/22 1815  cefTRIAXone (ROCEPHIN) 1 g in sodium chloride 0.9 % 100 mL IVPB        1 g 200 mL/hr over 30  Minutes Intravenous Every 24 hours 04/29/22 1718 05/04/22 1814       Subjective: Breathing feels ok  Objective: Vitals:   04/29/22 1713 04/29/22 1945 04/30/22 0420 04/30/22 1613  BP: (!) 106/58 (!) 110/53 (!) 118/54 124/64  Pulse: 69 74 64 72  Resp: 18 18 18 17   Temp: 98.7 F (37.1 C) 98.6 F (37 C) 98.9 F (37.2 C) 98.6 F (37 C)  TempSrc: Oral Oral Oral Axillary  SpO2: 95% 98% 98% 98%  Weight:   69 kg   Height:        Intake/Output Summary (Last 24 hours) at 04/30/2022 1642 Last data filed at 04/29/2022  2300 Gross per 24 hour  Intake 550.52 ml  Output 1400 ml  Net -849.48 ml   Filed Weights   04/28/22 2343 04/29/22 0345 04/30/22 0420  Weight: 67.9 kg 67 kg 69 kg    Examination:  General: No acute distress. Cardiovascular: RRR Lungs: Clear to auscultation bilaterally  Abdomen: Soft, nontender, nondistended  Neurological: Alert and oriented 3. Moves all extremities 4 with equal strength. Cranial nerves II through XII grossly intact. Extremities: No clubbing or cyanosis. No edema.   Data Reviewed: I have personally reviewed following labs and imaging studies  CBC: Recent Labs  Lab 04/27/22 1643 04/29/22 0710 04/29/22 1501 04/30/22 0129  WBC 5.8 5.2  --  5.0  NEUTROABS 3.6  --   --   --   HGB 8.6* 7.7* 7.8* 7.7*  HCT 26.7* 23.8* 23.9* 23.8*  MCV 95.0 91.5  --  91.9  PLT 115* 104*  --  87*    Basic Metabolic Panel: Recent Labs  Lab 04/27/22 1643 04/29/22 0551 04/30/22 0129  NA 132* 134* 135  K 4.1 3.8 3.7  CL 103 105 102  CO2 20* 21* 22  GLUCOSE 103* 98 110*  BUN 24* 23 25*  CREATININE 1.48* 1.48* 1.79*  CALCIUM 8.7* 8.5* 8.6*  MG  --  2.0 2.0  PHOS  --   --  4.7*    GFR: Estimated Creatinine Clearance: 23 mL/min (Mashal Slavick) (by C-G formula based on SCr of 1.79 mg/dL (H)).  Liver Function Tests: Recent Labs  Lab 04/30/22 0129  AST 16  ALT 8  ALKPHOS 86  BILITOT 0.6  PROT 6.4*  ALBUMIN 3.1*    CBG: No results for input(s): "GLUCAP" in the last 168 hours.   Recent Results (from the past 240 hour(s))  Resp panel by RT-PCR (RSV, Flu Sundeep Destin&B, Covid) Anterior Nasal Swab     Status: None   Collection Time: 04/28/22  1:53 AM   Specimen: Anterior Nasal Swab  Result Value Ref Range Status   SARS Coronavirus 2 by RT PCR NEGATIVE NEGATIVE Final    Comment: (NOTE) SARS-CoV-2 target nucleic acids are NOT DETECTED.  The SARS-CoV-2 RNA is generally detectable in upper respiratory specimens during the acute phase of infection. The lowest concentration of  SARS-CoV-2 viral copies this assay can detect is 138 copies/mL. Adrijana Haros negative result does not preclude SARS-Cov-2 infection and should not be used as the sole basis for treatment or other patient management decisions. Jomaira Darr negative result may occur with  improper specimen collection/handling, submission of specimen other than nasopharyngeal swab, presence of viral mutation(s) within the areas targeted by this assay, and inadequate number of viral copies(<138 copies/mL). Diann Bangerter negative result must be combined with clinical observations, patient history, and epidemiological information. The expected result is Negative.  Fact Sheet for Patients:  EntrepreneurPulse.com.au  Fact Sheet for Healthcare Providers:  IncredibleEmployment.be  This test is no t yet approved or cleared by the [place] and  has been authorized for detection and/or diagnosis of SARS-CoV-2 by FDA under an Emergency Use Authorization (EUA). This EUA will remain  in effect (meaning this test can be used) for the duration of the COVID-19 declaration under Section 564(b)(1) of the Act, 21 U.S.C.section 360bbb-3(b)(1), unless the authorization is terminated  or revoked sooner.       Influenza Delani Kohli by PCR NEGATIVE NEGATIVE Final   Influenza B by PCR NEGATIVE NEGATIVE Final    Comment: (NOTE) The Xpert Xpress SARS-CoV-2/FLU/RSV plus assay is intended as an aid in the diagnosis of influenza from Nasopharyngeal swab specimens and should not be used as Demisha Nokes sole basis for treatment. Nasal washings and aspirates are unacceptable for Xpert Xpress SARS-CoV-2/FLU/RSV testing.  Fact Sheet for Patients: EntrepreneurPulse.com.au  Fact Sheet for Healthcare Providers: IncredibleEmployment.be  This test is not yet approved or cleared by the [place] FDA and has been authorized for detection and/or diagnosis of SARS-CoV-2 by FDA under an Emergency Use  Authorization (EUA). This EUA will remain in effect (meaning this test can be used) for the duration of the COVID-19 declaration under Section 564(b)(1) of the Act, 21 U.S.C. section 360bbb-3(b)(1), unless the authorization is terminated or revoked.     Resp Syncytial Virus by PCR NEGATIVE NEGATIVE Final    Comment: (NOTE) Fact Sheet for Patients: EntrepreneurPulse.com.au  Fact Sheet for Healthcare Providers: IncredibleEmployment.be  This test is not yet approved or cleared by the [place] FDA and has been authorized for detection and/or diagnosis of SARS-CoV-2 by FDA under an Emergency Use Authorization (EUA). This EUA will remain in effect (meaning this test can be used) for the duration of the COVID-19 declaration under Section 564(b)(1) of the Act, 21 U.S.C. section 360bbb-3(b)(1), unless the authorization is terminated or revoked.  Performed at [facility], [place] [address] [place], [place] [address]   Culture, blood (Routine X 2) w Reflex to ID Panel     Status: None (Preliminary result)   Collection Time: 04/29/22  3:03 PM   Specimen: BLOOD LEFT WRIST  Result Value Ref Range Status   Specimen Description BLOOD LEFT WRIST  Final   Special Requests   Final    BOTTLES DRAWN AEROBIC AND ANAEROBIC Blood Culture adequate volume   Culture   Final    NO GROWTH < 24 HOURS Performed at [facility], [place] [address], [place], [place] [address]    Report Status PENDING  Incomplete  Culture, blood (Routine X 2) w Reflex to ID Panel     Status: None (Preliminary result)   Collection Time: 04/29/22  3:03 PM   Specimen: BLOOD RIGHT HAND  Result Value Ref Range Status   Specimen Description BLOOD RIGHT HAND  Final   Special Requests   Final    BOTTLES DRAWN AEROBIC AND ANAEROBIC Blood Culture results may not be optimal due to an inadequate volume of blood received in culture bottles   Culture   Final    NO GROWTH < 24  HOURS Performed at [facility], [place] [address] [place], [place] [address]    Report Status PENDING  Incomplete         Radiology Studies: DG CHEST PORT 1 VIEW  Result Date: 04/30/2022 CLINICAL DATA:  Shortness of breath EXAM: PORTABLE CHEST 1 VIEW COMPARISON:  Chest x-ray April 27, 2022 FINDINGS: Stable cardiomegaly. Sternotomy wires are intact. The  hila and mediastinum is stable. No pneumothorax. Increased interstitial markings bilaterally have worsened mildly. No other interval changes or acute abnormalities. IMPRESSION: Findings are most consistent with cardiomegaly and mild pulmonary edema. Electronically Signed   By: Dorise Bullion III M.D.   On: 04/30/2022 12:57   VAS [place] LOWER EXTREMITY VENOUS (DVT)  Result Date: 04/29/2022  Lower Venous DVT Study Patient Name:  MCKINNON GLICK  Date of Exam:   04/29/2022 Medical Rec #: [id number]     Accession #:    [id number] Date of Birth: 11-15-1927     Patient Gender: M Patient Age:   57 years Exam Location[facility]  [facility] Procedure:      VAS [place] LOWER EXTREMITY VENOUS (DVT) Referring Phys: Lemario Chaikin POWELL JR --------------------------------------------------------------------------------  Indications: History of PE.  Comparison Study: 01/27/20 Negative LEV Performing Technologist: Velva Harman Sturdivant RDMS, RVT  Examination Guidelines: Adele Milson complete evaluation includes B-mode imaging, spectral Doppler, color Doppler, and power Doppler as needed of all accessible portions of each vessel. Bilateral testing is considered an integral part of Laurie Penado complete examination. Limited examinations for reoccurring indications may be performed as noted. The reflux portion of the exam is performed with the patient in reverse Trendelenburg.  +---------+---------------+---------+-----------+----------+--------------+ RIGHT    CompressibilityPhasicitySpontaneityPropertiesThrombus Aging +---------+---------------+---------+-----------+----------+--------------+ CFV       Full           Yes      Yes                                 +---------+---------------+---------+-----------+----------+--------------+ SFJ      Full                                                        +---------+---------------+---------+-----------+----------+--------------+ FV Prox  Full                                                        +---------+---------------+---------+-----------+----------+--------------+ FV Mid   Full                                                        +---------+---------------+---------+-----------+----------+--------------+ FV DistalFull                                                        +---------+---------------+---------+-----------+----------+--------------+ PFV      Full                                                        +---------+---------------+---------+-----------+----------+--------------+ POP      Full           Yes  Yes                                 +---------+---------------+---------+-----------+----------+--------------+ PTV      Full                                                        +---------+---------------+---------+-----------+----------+--------------+ PERO     Full                                                        +---------+---------------+---------+-----------+----------+--------------+   +---------+---------------+---------+-----------+----------+--------------+ LEFT     CompressibilityPhasicitySpontaneityPropertiesThrombus Aging +---------+---------------+---------+-----------+----------+--------------+ CFV      Full           Yes      Yes                                 +---------+---------------+---------+-----------+----------+--------------+ SFJ      Full                                                        +---------+---------------+---------+-----------+----------+--------------+ FV Prox  Full                                                         +---------+---------------+---------+-----------+----------+--------------+ FV Mid   Full                                                        +---------+---------------+---------+-----------+----------+--------------+ FV DistalFull                                                        +---------+---------------+---------+-----------+----------+--------------+ PFV      Full                                                        +---------+---------------+---------+-----------+----------+--------------+ POP      Full           Yes      Yes                                 +---------+---------------+---------+-----------+----------+--------------+ PTV      Full                                                        +---------+---------------+---------+-----------+----------+--------------+  PERO     Full                                                        +---------+---------------+---------+-----------+----------+--------------+     Summary: BILATERAL: - No evidence of deep vein thrombosis seen in the lower extremities, bilaterally. -No evidence of popliteal cyst, bilaterally.   *See table(s) above for measurements and observations.    Preliminary    ECHOCARDIOGRAM LIMITED  Result Date: 04/28/2022    ECHOCARDIOGRAM LIMITED REPORT   Patient Name:   ASTER ECKRICH Date of Exam: 04/28/2022 Medical Rec #:  [id number]    Height:       63.0 in Accession #:    [id number]   Weight:       170.0 lb Date of Birth:  May 18, 1927    BSA:          1.805 m Patient Age:    74 years     BP:           124/59 mmHg Patient Gender: M            HR:           60 bpm. Exam Location:  Inpatient Procedure: Limited Echo, Cardiac Doppler and Limited Color Doppler Indications:    CHF-Acute Systolic G81.85  History:        Patient has prior history of Echocardiogram examinations, most                 recent 04/13/2021. CAD, Aortic Valve Disease, Arrythmias:Atrial                  Fibrillation, Signs/Symptoms:Shortness of Breath; Risk                 Factors:Non-Smoker, Hypertension and Dyslipidemia.  Sonographer:    Greer Pickerel Referring Phys: [id number] RONDELL Paisely Brick SMITH  Sonographer Comments: Image acquisition challenging due to patient body habitus and Image acquisition challenging due to respiratory motion. IMPRESSIONS  1. Assessing wall motion is challenging. Left ventricular ejection fraction, by estimation, is 50 to 55%. The left ventricle has low normal function.  2. Right ventricular systolic function is mildly reduced. The right ventricular size is moderately enlarged. There is moderately elevated pulmonary artery systolic pressure.  3. Left atrial size was moderately dilated.  4. Trivial mitral valve regurgitation.  5. Aortic valve regurgitation is not visualized.  6. The inferior vena cava is normal in size with <50% respiratory variability, suggesting right atrial pressure of 8 mmHg. FINDINGS  Left Ventricle: Assessing wall motion is challenging. Left ventricular ejection fraction, by estimation, is 50 to 55%. The left ventricle has low normal function. There is no left ventricular hypertrophy. Right Ventricle: The right ventricular size is moderately enlarged. Right ventricular systolic function is mildly reduced. There is moderately elevated pulmonary artery systolic pressure. The tricuspid regurgitant velocity is 2.93 m/s, and with an assumed right atrial pressure of 15 mmHg, the estimated right ventricular systolic pressure is 26.3 mmHg. Left Atrium: Left atrial size was moderately dilated. Right Atrium: Right atrial size was normal in size. Mitral Valve: Trivial mitral valve regurgitation. Tricuspid Valve: Tricuspid valve regurgitation is mild. Aortic Valve: Aortic valve regurgitation is not visualized. Pulmonic Valve: Pulmonic valve regurgitation is not visualized. Venous: The inferior vena cava is normal in size with less than 50%  respiratory variability, suggesting right  atrial pressure of 8 mmHg. Additional Comments: Spectral Doppler performed. Color Doppler performed.  LEFT VENTRICLE PLAX 2D LVIDd:         3.80 cm LVIDs:         3.00 cm LV PW:         1.00 cm LV IVS:        0.90 cm  MITRAL VALVE               TRICUSPID VALVE MV Area (PHT): 6.32 cm    TR Peak grad:   34.3 mmHg MV Decel Time: 120 msec    TR Vmax:        293.00 cm/s MV E velocity: 92.20 cm/s MV Dymir Neeson velocity: 37.80 cm/s MV E/Veida Spira ratio:  2.44 Landscape architect signed by Phineas Inches Signature Date/Time: 04/28/2022/5:35:41 PM    Final         Scheduled Meds:  finasteride  5 mg Oral Daily   FLUoxetine  20 mg Oral q AM   furosemide  40 mg Intravenous Daily   rosuvastatin  5 mg Oral Q supper   sodium chloride flush  3 mL Intravenous Q12H   tamsulosin  0.4 mg Oral Q supper   Continuous Infusions:  cefTRIAXone (ROCEPHIN)  IV 200 mL/hr at 04/29/22 1900   heparin 750 Units/hr (04/29/22 1900)     LOS: 2 days    Time spent: over 30 min    Fayrene Helper, MD [facility]   To contact the attending provider between 7A-7P or the covering provider during after hours 7P-7A, please log into the web site [email or web address] and access using universal [place] password for that web site. If you do not have the password, please call the hospital operator.  04/30/2022, 4:42 PM

## 2022-05-01 NOTE — Progress Notes (Signed)
ANTICOAGULATION CONSULT NOTE-Follow Up  Pharmacy Consult for Heparin infusion  Indication: pulmonary embolus  Allergies  Allergen Reactions   Feldene [Piroxicam] Rash    Blistering rash   Neomycin-Polymyxin-Hc Itching   Statins Other (See Comments)    Myalgias   Valium [Diazepam] Other (See Comments)    Dizziness    Latex Rash   Tequin [Gatifloxacin] Anxiety   Vibra-Tab [Doxycycline] Nausea Only    Patient Measurements: Height: 5\' 3"  (160 cm) Weight: 69 kg (152 lb 1.9 oz) IBW/kg (Calculated) : 56.9 Heparin Dosing Weight: 72.9 kg  Vital Signs: Temp: 97.7 F (36.5 C) (01/27 2014) Temp Source: Oral (01/27 2014) BP: 119/72 (01/27 2014)  Labs: Recent Labs    04/28/22 1808 04/29/22 0551 04/29/22 0710 04/29/22 1501 04/29/22 1824 04/30/22 0129 05/01/22 0141  HGB   < >  --  7.7* 7.8*  --  7.7* 8.2*  HCT   < >  --  23.8* 23.9*  --  23.8* 25.0*  PLT  --   --  104*  --   --  87* 97*  APTT  --   --  178*  --  74* 77*  --   HEPARINUNFRC  --   --  0.95*  --   --  0.43 0.32  CREATININE  --  1.48*  --   --   --  1.79*  --    < > = values in this interval not displayed.     Estimated Creatinine Clearance: 23 mL/min (A) (by C-G formula based on SCr of 1.79 mg/dL (H)).   Medical History: Past Medical History:  Diagnosis Date   Adenocarcinoma of right lung ([place]) 01/06/2016   Anginal chest pain at rest    Chronic non, controlled on Lorazepam   Anxiety    Arthritis    "all over"    BPH (benign prostatic hyperplasia)    CAD (coronary artery disease)    Chronic lower back pain    Complication of anesthesia    "problems making water afterwards"   Depression    DJD (degenerative joint disease)    Esophageal ulcer without bleeding    bid ppi indefinitely   Family history of adverse reaction to anesthesia    "children w/PONV"   GERD (gastroesophageal reflux disease)    Headache    "couple/week maybe" (09/04/2015)   Hemochromatosis    Possible, elevated iron stores,  hook-like osteophytes on hand films, normal LFTs   Hepatitis    "yellow jaundice as a baby"   History of ASCVD    MULTIVESSEL   Hyperlipidemia    Hypertension    Mild aortic stenosis 06/23/2021   Osteoarthritis    Pneumonia    "several times; got a little now" (09/04/2015)   Rheumatoid arthritis ([place])    Subdural hematoma ([place])    small after fall 09/2016-plavix held, neurosurgery consulted.  Asymptomatic.     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   04/27/2022   Cholecalciferol (VITAMIN D-3 PO) Take 1 capsule by mouth in the morning.   04/27/2022   finasteride (PROSCAR) 5 MG tablet Take 1 tablet (5 mg total) by mouth daily. 90 tablet 3 04/27/2022   FLUoxetine (PROZAC) 20 MG capsule TAKE  1 CAPSULE EVERY DAY (Patient taking differently: Take 20 mg by mouth in the morning.) 90 capsule 0 04/27/2022   furosemide (LASIX) 20 MG tablet Take 1 tablet (20 mg total) by mouth daily. (Patient taking differently: Take 20 mg by mouth in the morning.) 90 tablet 3 04/27/2022   Lifitegrast (XIIDRA) 5 % SOLN Place 1 drop into both eyes 2 (two) times daily. (Patient taking differently: Place 1 drop into both eyes 2 (two) times daily as needed (dry eyes).) 60 each 12 Past Month   metoprolol tartrate (LOPRESSOR) 25 MG tablet TAKE 1/2 TABLET TWICE DAILY (Patient taking differently: Take 12.5 mg by mouth daily.) 90 tablet 3 04/27/2022 at 0800   nitroGLYCERIN (NITROSTAT) 0.4 MG SL tablet Place 1 tablet (0.4 mg total) under the tongue every 5 (five) minutes as needed for chest pain. 25 tablet 2 UNK   pantoprazole (PROTONIX) 40 MG tablet Take 1 tablet (40 mg total) by mouth in the morning. 90 tablet 0 04/27/2022   polyethylene glycol (MIRALAX / GLYCOLAX) 17  g packet Take 17 g by mouth daily as needed for mild constipation. 14 each 0 Past Month   rosuvastatin (CRESTOR) 5 MG tablet TAKE 1 TABLET EVERY DAY (Patient taking differently: Take 5 mg by mouth daily with supper.) 90 tablet 1 04/26/2022   tamsulosin (FLOMAX) 0.4 MG CAPS capsule TAKE 1 CAPSULE EVERY DAY (Patient taking differently: Take 0.4 mg by mouth daily with supper.) 90 capsule 10 04/26/2022   XARELTO 15 MG TABS tablet TAKE 1 TABLET EVERY DAY WITH SUPPER (Patient taking differently: Take 15 mg by mouth daily with supper.) 30 tablet 10 04/26/2022 at 1800   ZINC OXIDE EX Apply 1 application  topically 4 (four) times daily as needed (skin barrier).   Past Month    Assessment: 89 yo M with history of atrial fibrillation and VTE on rivaroxaban 15mg  every evening with dinner. Last dose of rivaroxaban was 04/27/22 evening. CT PE positive for small acute PE in subsegmental pulomonary artery of RLL with evidence of chronic right heart strain. Pharmacy consulted to initiate and dose heparin for acute PE. After aPTT and heparin level correlated, switched back to heparin level  Heparin level therapeutic at 0.32. Hgb low but increasing from 7.4>8.2 and plts 87>97. No signs/symptoms of bleeding reported and no issues with the heparin infusion overnight.  Goal of Therapy:  Heparin level 0.3-0.7 units/ml aPTT 66-102 seconds Monitor platelets by anticoagulation protocol: Yes   Plan:  Cont heparin 750 units/h Monitor heparin levels since aPTT and heparin levels correlating Monitor signs/symptoms of bleeding   Sandford Craze, PharmD. [facility] Acute Care PGY-1 05/01/2022 7:07 AM

## 2022-05-01 NOTE — Consult Note (Signed)
Evan [name] CONSULT NOTE  Patient Care Team: [name], MD as PCP - General (Family Medicine) Jettie Booze, MD as PCP - Cardiology (Cardiology) Edythe Clarity, [facility] as Pharmacist (Pharmacist) Carson Myrtle, Maree Erie, LCSW as Social Worker (Licensed Clinical Social Worker)   ASSESSMENT & PLAN:  Recurrent PE I have reviewed his records extensively When the patient was admitted on February 02, 2018, the admission physician documented that the patient has missed several doses of Eliquis prior to the diagnosis of recurrent DVT/PE In my opinion, this does not constitute failure of treatment/failed Eliquis treatment His anticoagulation therapy was switched to Lovenox The patient is prone to have recurrent falls and subsequently developed intracranial bleeding in 2020 of which anticoagulation was discontinued and subsequently switched to Xarelto He has been interruption of anticoagulation therapy in November when he developed hemoptysis but none recently The evidence of segmental PE is of minimal burden on his CT angiogram and is likely chronic in nature There is now concern of failure of Xarelto Overall, with his elevated serum creatinine and frail status, personally, I would prefer him to be switched back to Eliquis when he is ready For now, recommend IV heparin, to be transition to oral Eliquis when he is ready to be discharged I do not recommend loading dose I recommend low-dose Eliquis at 2.5 mg twice daily upon discharge I explained the risk of life-threatening bleeding with his son; with his frail status and recurrent falls, even at low-dose Eliquis, his risk of life-threatening bleeding would be 5 to 10%, but given the risk and benefits ratio, I think it is reasonable to continue anticoagulation therapy for now Treatment duration will be lifelong  Remote history of lung cancer No evidence of disease recurrence  Acquired pancytopenia I suspect his anemia is  due to anemia chronic illness.  However, slow chronic GI bleed cannot be excluded.  I plan to check iron studies tomorrow.  With his recurrent falls, I think it is reasonable to also check vitamin B12 level to rule out B12 deficiency as a cause of his weakness and recurrent falls There is no contraindication to continue anticoagulation therapy as long as his platelet count is greater than 50,000  Congestive heart failure, elevated serum creatinine Would defer to primary service for management of heart failure As above, I recommend reduced dose Eliquis due to elevated baseline serum creatinine, his frail status and his age  Discharge planning Will defer to primary service.  He does not need long-term hematology follow-up  All questions were answered. The patient knows to call the clinic with any problems, questions or concerns. The total time spent in the appointment was 80 minutes encounter with patients including review of chart and various tests results, discussions about plan of care and coordination of care plan  Heath Lark, MD 1/28/202411:49 AM  CHIEF COMPLAINTS/PURPOSE OF CONSULTATION:  Recurrent DVT/PE, history of failed anticoagulation therapy  HISTORY OF PRESENTING ILLNESS:  Evan [name] 89 y.o. male is here because of seen at the request by hospitalist service.  His son is by the bedside.  The patient have numerous hospitalization in the past.  He has remote history of lung cancer treated with radiation and is in remission with no evidence of recurrence  He has history of DVT/PE and has been on several different anticoagulation therapy in the past The patient had numerous traumatic fall injuries in the past On February 02, 2018, he was admitted due to sensation of shortness of breath.  On the day of that admission, he was also noted to have missed doses of Eliquis.  When he was found to have recurrent DVT/PE, his anticoagulation therapy was switched to Lovenox In April 2020, the  patient was found by family after presentation of fall and he developed focal neurological deficit.  CT imaging showed left thalamic bleed thought to be related to coagulopathy/traumatic injury in the setting of anticoagulation therapy.  Lovenox was discontinued Subsequently, he was started on Xarelto timing is uncertain but I believe he was put on it sometime around the end of 2020 and he has remained on Xarelto since then  According to family members, he continues to have frequent falls.  His last fall was approximately 2 to 3 weeks ago and he injured the right temporal area but thankfully without any other major injury or neurological deficits  Around November 2023, Xarelto was placed on hold for several days due to hemoptysis before he resume on treatment  He has several CT imaging of the chest performed over the last few months for various reasons.  3 days ago, he underwent CT angiogram to look for pulmonary embolism due to shortness of breath.  Minimal clot burden was found.  No comparable CT angiogram was available in the past 6 months, last CT angiogram was in May 2020.  He is currently receiving IV heparin.  The patient was also found to have evidence of congestive heart failure and is on medical management.  He is noted to have mild pancytopenia on admission.  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   Anginal chest pain at rest    Chronic non, controlled on Lorazepam   Anxiety    Arthritis    "all over"    BPH (benign prostatic hyperplasia)    CAD (coronary artery disease)    Chronic lower back pain    Complication of anesthesia    "problems making water afterwards"   Depression    DJD (degenerative joint disease)    Esophageal ulcer without  bleeding    bid ppi indefinitely   Family history of adverse reaction to anesthesia    "children w/PONV"   GERD (gastroesophageal reflux disease)    Headache    "couple/week maybe" (09/04/2015)   Hemochromatosis    Possible, elevated iron stores, hook-like osteophytes on hand films, normal LFTs   Hepatitis    "yellow jaundice as a baby"   History of ASCVD    MULTIVESSEL   Hyperlipidemia    Hypertension    Mild aortic stenosis 06/23/2021   Osteoarthritis    Pneumonia    "several times; got a little now" (09/04/2015)   Rheumatoid arthritis ([place])    Subdural hematoma ([place])    small after fall 09/2016-plavix held, neurosurgery consulted.  Asymptomatic.     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[place]) 02/2018   on Lovenox lifelong since he failed PO Eliquis    SURGICAL HISTORY: Past Surgical History:  Procedure Laterality Date   ARM SKIN LESION BIOPSY / EXCISION Left 09/02/2015   CATARACT EXTRACTION W/ INTRAOCULAR LENS  IMPLANT, BILATERAL Bilateral    CHOLECYSTECTOMY OPEN     CORNEAL TRANSPLANT Bilateral    "one at [facility]; one at [facility]"   [place]  1999   DESCENDING AORTIC ANEURYSM REPAIR W/ STENT     DE stent ostium into the right radial free graft at [facility]-, 02-2007   ESOPHAGOGASTRODUODENOSCOPY N/A 11/09/2014   Procedure: ESOPHAGOGASTRODUODENOSCOPY (EGD);  Surgeon: [name], MD;  Location: [facility] ENDOSCOPY;  Service: Endoscopy;  Laterality: N/A;   INGUINAL HERNIA REPAIR     JOINT REPLACEMENT     LEFT HEART CATH AND CORS/GRAFTS ANGIOGRAPHY N/A 06/27/2017   Procedure: LEFT HEART CATH AND CORS/GRAFTS ANGIOGRAPHY;  Surgeon: [name], MD;  Location: [facility];  Service: Cardiovascular;  Laterality: N/A;   NASAL SINUS SURGERY     SHOULDER OPEN ROTATOR CUFF REPAIR Right    TOTAL KNEE ARTHROPLASTY Bilateral     SOCIAL HISTORY: Social History   Socioeconomic History   Marital status: Widowed    Spouse name: Not on file   Number of  children: Not on file   Years of education: Not on file   Highest education level: Not on file  Occupational History   Not on file  Tobacco Use   Smoking status: Never    Passive exposure: Yes   Smokeless tobacco: Former    Types: Chew   Tobacco comments:    "quit chewing in the 1960s"  Vaping Use   Vaping Use: Never used  Substance and Sexual Activity   Alcohol use: No   Drug use: No   Sexual activity: Never  Other Topics Concern   Not on file  Social History Narrative   Not on file   Social Determinants of Health   Financial Resource Strain: Low Risk  (05/07/2021)   Overall Financial Resource Strain (CARDIA)    Difficulty of Paying Living Expenses: Not hard at all  Food Insecurity: No Food Insecurity (04/28/2022)   Hunger Vital Sign    Worried About Running Out of Food in the Last Year: Never true    [place] in the Last Year: Never true  Transportation Needs: No Transportation Needs (04/28/2022)   PRAPARE - Hydrologist (Medical): No    Lack of Transportation (Non-Medical): No  Physical Activity: Insufficiently Active (05/07/2021)   Exercise Vital Sign    Days of Exercise per Week: 3 days    Minutes of Exercise per Session: 20 min  Stress: No Stress Concern Present (05/07/2021)   [place]    Feeling of Stress : Not at all  Social Connections: Socially Isolated (05/07/2021)   Social Connection and Isolation Panel [NHANES]    Frequency of Communication with Friends and Family: More than three times a week    Frequency of Social Gatherings with Friends and Family: More than three times a week    Attends Religious Services: Never    Marine scientist or Organizations: No    Attends Archivist Meetings: Never    Marital Status: Widowed  Intimate Partner Violence: Not At Risk (04/28/2022)   Humiliation, Afraid, Rape, and Kick questionnaire    Fear of Current or  Ex-Partner: No    Emotionally Abused: No    Physically Abused: No    Sexually Abused: No    FAMILY HISTORY: Family History  Problem Relation Age of Onset   Arthritis-Osteo Sister    Heart attack Brother    Heart attack Other    Cancer Neg Hx     ALLERGIES:  is allergic to feldene [piroxicam], neomycin-polymyxin-hc, statins, valium [diazepam], latex, tequin [gatifloxacin],  and vibra-tab [doxycycline].  MEDICATIONS:  Current Facility-Administered Medications  Medication Dose Route Frequency Provider Last Rate Last Admin   acetaminophen (TYLENOL) tablet 650 mg  650 mg Oral Q6H PRN Norval Morton, MD   650 mg at 05/01/22 0209   Or   acetaminophen (TYLENOL) suppository 650 mg  650 mg Rectal Q6H PRN Fuller Plan A, MD       albuterol (PROVENTIL) (2.5 MG/3ML) 0.083% nebulizer solution 2.5 mg  2.5 mg Nebulization Q4H PRN Smith, Rondell A, MD       cefTRIAXone (ROCEPHIN) 1 g in sodium chloride 0.9 % 100 mL IVPB  1 g Intravenous Q24H Elodia Florence., MD 200 mL/hr at 04/30/22 1832 1 g at 04/30/22 1832   finasteride (PROSCAR) tablet 5 mg  5 mg Oral Daily Fuller Plan A, MD   5 mg at 05/01/22 0852   FLUoxetine (PROZAC) capsule 20 mg  20 mg Oral q AM Fuller Plan A, MD   20 mg at 05/01/22 0851   heparin ADULT infusion 100 units/mL (25000 units/237mL)  750 Units/hr Intravenous Continuous Einar Grad, RPH 7.5 mL/hr at 04/30/22 2117 750 Units/hr at 04/30/22 2117   loratadine (CLARITIN) tablet 10 mg  10 mg Oral Daily Elodia Florence., MD   10 mg at 05/01/22 7893   melatonin tablet 3 mg  3 mg Oral QHS PRN Howerter, Justin B, DO   3 mg at 05/01/22 0209   naloxone (NARCAN) injection 0.4 mg  0.4 mg Intravenous PRN Howerter, Justin B, DO       Oral care mouth rinse  15 mL Mouth Rinse PRN Opyd, Ilene Qua, MD       rosuvastatin (CRESTOR) tablet 5 mg  5 mg Oral Q supper Fuller Plan A, MD   5 mg at 04/30/22 1829   sodium chloride flush (NS) 0.9 % injection 3 mL  3 mL Intravenous  Q12H Smith, Rondell A, MD   3 mL at 05/01/22 0900   tamsulosin (FLOMAX) capsule 0.4 mg  0.4 mg Oral Q supper Fuller Plan A, MD   0.4 mg at 04/30/22 1829   Zinc Oxide (TRIPLE PASTE) 12.8 % ointment   Topical QID PRN Norval Morton, MD   Given at 05/01/22 [phone]    REVIEW OF SYSTEMS:  unable to obtain  PHYSICAL EXAMINATION: ECOG PERFORMANCE STATUS: 3 - Symptomatic, >50% confined to bed  Vitals:   04/30/22 1613 04/30/22 2014  BP: 124/64 119/72  Pulse: 72   Resp: 17 18  Temp: 98.6 F (37 C) 97.7 F (36.5 C)  SpO2: 98% 97%   Filed Weights   04/28/22 2343 04/29/22 0345 04/30/22 0420  Weight: 149 lb 12.8 oz (67.9 kg) 147 lb 11.2 oz (67 kg) 152 lb 1.9 oz (69 kg)    GENERAL:alert, no distress and comfortable.  He looks frail SKIN: skin color, texture, turgor are normal, no rashes or significant lesions.  Noted skin bruises EYES: normal, conjunctiva are pink and non-injected, sclera clear OROPHARYNX:no exudate, no erythema and lips, buccal mucosa, and tongue normal  NECK: supple, thyroid normal size, non-tender, without nodularity LYMPH:  no palpable lymphadenopathy in the cervical, axillary or inguinal LUNGS: clear to auscultation and percussion with normal breathing effort HEART: regular rate & rhythm and no murmurs and no lower extremity edema ABDOMEN:abdomen soft, non-tender and normal bowel sounds Musculoskeletal:no cyanosis of digits and no clubbing  PSYCH: alert & oriented x 3 with fluent speech NEURO: no focal motor/sensory deficits  LABORATORY DATA:  I have reviewed the data as listed Lab Results  Component Value Date   WBC 5.8 05/01/2022   HGB 8.2 (L) 05/01/2022   HCT 25.0 (L) 05/01/2022   MCV 91.6 05/01/2022   PLT 97 (L) 05/01/2022     RADIOGRAPHIC STUDIES: I have personally reviewed the radiological images as listed and agreed with the findings in the report. VAS [place] LOWER EXTREMITY VENOUS (DVT)  Result Date: 05/01/2022  Lower Venous DVT Study Patient Name:   Evan [name]  Date of Exam:   04/29/2022 Medical Rec #: [id number]     Accession #:    [id number] Date of Birth: [birth year]     Patient Gender: M Patient Age:   89 years Exam Location:  [facility] Procedure:      VAS [place] LOWER EXTREMITY VENOUS (DVT) Referring Phys: A POWELL JR --------------------------------------------------------------------------------  Indications: History of PE.  Comparison Study: 01/27/20 Negative LEV Performing Technologist: Velva Harman Sturdivant RDMS, RVT  Examination Guidelines: A complete evaluation includes B-mode imaging, spectral Doppler, color Doppler, and power Doppler as needed of all accessible portions of each vessel. Bilateral testing is considered an integral part of a complete examination. Limited examinations for reoccurring indications may be performed as noted. The reflux portion of the exam is performed with the patient in reverse Trendelenburg.  +---------+---------------+---------+-----------+----------+--------------+ RIGHT    CompressibilityPhasicitySpontaneityPropertiesThrombus Aging +---------+---------------+---------+-----------+----------+--------------+ CFV      Full           Yes      Yes                                 +---------+---------------+---------+-----------+----------+--------------+ SFJ      Full                                                        +---------+---------------+---------+-----------+----------+--------------+ FV Prox  Full                                                        +---------+---------------+---------+-----------+----------+--------------+ FV Mid   Full                                                        +---------+---------------+---------+-----------+----------+--------------+ FV DistalFull                                                        +---------+---------------+---------+-----------+----------+--------------+ PFV      Full                                                         +---------+---------------+---------+-----------+----------+--------------+ POP  Full           Yes      Yes                                 +---------+---------------+---------+-----------+----------+--------------+ PTV      Full                                                        +---------+---------------+---------+-----------+----------+--------------+ PERO     Full                                                        +---------+---------------+---------+-----------+----------+--------------+   +---------+---------------+---------+-----------+----------+--------------+ LEFT     CompressibilityPhasicitySpontaneityPropertiesThrombus Aging +---------+---------------+---------+-----------+----------+--------------+ CFV      Full           Yes      Yes                                 +---------+---------------+---------+-----------+----------+--------------+ SFJ      Full                                                        +---------+---------------+---------+-----------+----------+--------------+ FV Prox  Full                                                        +---------+---------------+---------+-----------+----------+--------------+ FV Mid   Full                                                        +---------+---------------+---------+-----------+----------+--------------+ FV DistalFull                                                        +---------+---------------+---------+-----------+----------+--------------+ PFV      Full                                                        +---------+---------------+---------+-----------+----------+--------------+ POP      Full           Yes      Yes                                 +---------+---------------+---------+-----------+----------+--------------+  PTV      Full                                                         +---------+---------------+---------+-----------+----------+--------------+ PERO     Full                                                        +---------+---------------+---------+-----------+----------+--------------+     Summary: BILATERAL: - No evidence of deep vein thrombosis seen in the lower extremities, bilaterally. -No evidence of popliteal cyst, bilaterally.   *See table(s) above for measurements and observations. Electronically signed by Servando Snare MD on 05/01/2022 at 10:15:47 AM.    Final    DG CHEST PORT 1 VIEW  Result Date: 04/30/2022 CLINICAL DATA:  Shortness of breath EXAM: PORTABLE CHEST 1 VIEW COMPARISON:  Chest x-ray April 27, 2022 FINDINGS: Stable cardiomegaly. Sternotomy wires are intact. The hila and mediastinum is stable. No pneumothorax. Increased interstitial markings bilaterally have worsened mildly. No other interval changes or acute abnormalities. IMPRESSION: Findings are most consistent with cardiomegaly and mild pulmonary edema. Electronically Signed   By: Dorise Bullion III M.D.   On: 04/30/2022 12:57   ECHOCARDIOGRAM LIMITED  Result Date: 04/28/2022    ECHOCARDIOGRAM LIMITED REPORT   Patient Name:   Evan [name] Date of Exam: 04/28/2022 Medical Rec #:  [id number]    Height:       63.0 in Accession #:    [id number]   Weight:       170.0 lb Date of Birth:  08-May-1927    BSA:          1.805 m Patient Age:    77 years     BP:           124/59 mmHg Patient Gender: M            HR:           60 bpm. Exam Location:  Inpatient Procedure: Limited Echo, Cardiac Doppler and Limited Color Doppler Indications:    CHF-Acute Systolic B44.96  History:        Patient has prior history of Echocardiogram examinations, most                 recent 04/13/2021. CAD, Aortic Valve Disease, Arrythmias:Atrial                 Fibrillation, Signs/Symptoms:Shortness of Breath; Risk                 Factors:Non-Smoker, Hypertension and Dyslipidemia.  Sonographer:    Greer Pickerel Referring Phys:  [id number] RONDELL A SMITH  Sonographer Comments: Image acquisition challenging due to patient body habitus and Image acquisition challenging due to respiratory motion. IMPRESSIONS  1. Assessing wall motion is challenging. Left ventricular ejection fraction, by estimation, is 50 to 55%. The left ventricle has low normal function.  2. Right ventricular systolic function is mildly reduced. The right ventricular size is moderately enlarged. There is moderately elevated pulmonary artery systolic pressure.  3. Left atrial size was moderately dilated.  4. Trivial mitral valve regurgitation.  5. Aortic valve regurgitation is not visualized.  6. The  inferior vena cava is normal in size with <50% respiratory variability, suggesting right atrial pressure of 8 mmHg. FINDINGS  Left Ventricle: Assessing wall motion is challenging. Left ventricular ejection fraction, by estimation, is 50 to 55%. The left ventricle has low normal function. There is no left ventricular hypertrophy. Right Ventricle: The right ventricular size is moderately enlarged. Right ventricular systolic function is mildly reduced. There is moderately elevated pulmonary artery systolic pressure. The tricuspid regurgitant velocity is 2.93 m/s, and with an assumed right atrial pressure of 15 mmHg, the estimated right ventricular systolic pressure is 52.8 mmHg. Left Atrium: Left atrial size was moderately dilated. Right Atrium: Right atrial size was normal in size. Mitral Valve: Trivial mitral valve regurgitation. Tricuspid Valve: Tricuspid valve regurgitation is mild. Aortic Valve: Aortic valve regurgitation is not visualized. Pulmonic Valve: Pulmonic valve regurgitation is not visualized. Venous: The inferior vena cava is normal in size with less than 50% respiratory variability, suggesting right atrial pressure of 8 mmHg. Additional Comments: Spectral Doppler performed. Color Doppler performed.  LEFT VENTRICLE PLAX 2D LVIDd:         3.80 cm LVIDs:         3.00 cm  LV PW:         1.00 cm LV IVS:        0.90 cm  MITRAL VALVE               TRICUSPID VALVE MV Area (PHT): 6.32 cm    TR Peak grad:   34.3 mmHg MV Decel Time: 120 msec    TR Vmax:        293.00 cm/s MV E velocity: 92.20 cm/s MV A velocity: 37.80 cm/s MV E/A ratio:  2.44 Landscape architect signed by Phineas Inches Signature Date/Time: 04/28/2022/5:35:41 PM    Final    CT Angio Chest PE W and/or Wo Contrast  Result Date: 04/28/2022 CLINICAL DATA:  Chest pain, dyspnea EXAM: CT ANGIOGRAPHY CHEST WITH CONTRAST TECHNIQUE: Multidetector CT imaging of the chest was performed using the standard protocol during bolus administration of intravenous contrast. Multiplanar CT image reconstructions and MIPs were obtained to evaluate the vascular anatomy. RADIATION DOSE REDUCTION: This exam was performed according to the departmental dose-optimization program which includes automated exposure control, adjustment of the mA and/or kV according to patient size and/or use of iterative reconstruction technique. CONTRAST:  36mL OMNIPAQUE IOHEXOL 350 MG/ML SOLN COMPARISON:  None Available. FINDINGS: Cardiovascular: There is adequate opacification of pulmonary arterial tree. There is a single intraluminal filling defect identified within the posterior segmental pulmonary artery of the right lower lobe in keeping with an acute pulmonary embolus. The overall embolic burden is tiny. The central pulmonary arteries are dilated in keeping with changes of pulmonary arterial hypertension. Coronary artery bypass grafting has been performed. Global cardiac size is mildly enlarged, stable since prior examination. There is mild relative dilation of the right ventricle and right atrium in keeping with elevated right heart pressures as well as prominent reflux of contrast to the hepatic venous system in keeping with some degree of right heart failure. The RV/LV ratio is elevated (RV/LV ratio equal 1.0), however, this appears stable since prior  examination and is likely chronic in nature. No pericardial effusion. Thoracic aorta is of normal caliber. Moderate atherosclerotic calcification. Mediastinum/Nodes: Shotty mediastinal adenopathy is nonspecific, possibly reactive in nature. No frankly pathologic thoracic adenopathy. Visualized thyroid unremarkable. Esophagus is unremarkable. Lungs/Pleura: Small bilateral pleural effusions are present. There is superimposed thickening of the peribronchovascular interstitium and  scattered basilar and dependent predominant interstitial and ground-glass pulmonary infiltrates most suggestive of superimposed mild cardiogenic failure. More focal consolidation within the posterior basal right lower lobe may reflect superimposed pulmonary infarct given the previously identified pulmonary embolus. Upper Abdomen: No acute abnormality Musculoskeletal: No lytic or blastic bone lesion. Osseous structures are age-appropriate. Review of the MIP images confirms the above findings. IMPRESSION: 1. Small acute pulmonary embolus within the posterior segmental pulmonary artery of the right lower lobe. The overall embolic burden is tiny. While there is evidence right heart strain, this appears chronic in nature and is stable since prior examination. 2. Morphologic changes in keeping with pulmonary arterial hypertension and elevated right heart pressures. Reflux of contrast into the hepatic venous system in keeping with at least some degree of right heart failure. 3. Mild interstitial and alveolar pulmonary edema and small bilateral pleural effusions in keeping with mild cardiogenic failure. 4. Superimposed focal consolidation within the posterior basal right lower lobe may reflect superimposed pulmonary infarct given the previously identified pulmonary embolus. These results were called by telephone at the time of interpretation on 04/28/2022 at 3:43 am to provider Ripley Fraise , who verbally acknowledged these results. Aortic  Atherosclerosis (ICD10-I70.0). Electronically Signed   By: Fidela Salisbury M.D.   On: 04/28/2022 03:43   CT Head Wo Contrast  Result Date: 04/28/2022 CLINICAL DATA:  Evaluation for delirium. EXAM: CT HEAD WITHOUT CONTRAST TECHNIQUE: Contiguous axial images were obtained from the base of the skull through the vertex without intravenous contrast. RADIATION DOSE REDUCTION: This exam was performed according to the departmental dose-optimization program which includes automated exposure control, adjustment of the mA and/or kV according to patient size and/or use of iterative reconstruction technique. COMPARISON:  Prior CT from 03/12/2022. FINDINGS: Brain: Diffuse prominence of the CSF containing spaces compatible generalized cerebral atrophy. Moderately advanced chronic microvascular ischemic disease noted involving the supratentorial cerebral white matter. Few small remote lacunar infarcts about the bilateral basal ganglia/corona radiata. Small remote right cerebellar infarct. No acute intracranial hemorrhage. No acute large vessel territory infarct. No mass lesion, mass effect, or midline shift. No hydrocephalus or extra-axial fluid collection. Vascular: No abnormal hyperdense vessel. Calcified atherosclerosis present at skull base. Skull: Scalp soft tissues demonstrate no acute finding. Calvarium intact. Sinuses/Orbits: Globes and orbital soft tissues within normal limits. Chronic left maxillary sinusitis noted. Prior right mastoidectomy. Small chronic appearing left mastoid effusion noted. Other: None. IMPRESSION: 1. No acute intracranial abnormality. 2. Generalized cerebral atrophy with moderately advanced chronic small vessel ischemic disease. 3. Chronic left maxillary sinusitis. Electronically Signed   By: Jeannine Boga M.D.   On: 04/28/2022 03:30   DG Chest 2 View  Result Date: 04/27/2022 CLINICAL DATA:  Chest pain.  Shortness of breath. EXAM: CHEST - 2 VIEW COMPARISON:  CXR 03/12/22 FINDINGS: No  pleural effusion. No pneumothorax. No focal airspace opacity. Unchanged cardiac and mediastinal contours. Status post median sternotomy. No displaced rib fractures. Visualized upper abdomen is notable for gas distended loops of bowel and stomach. IMPRESSION: No focal airspace opacity. Electronically Signed   By: Marin Roberts M.D.   On: 04/27/2022 17:01

## 2022-05-01 NOTE — Progress Notes (Signed)
[facility] night cross cover note:   I was notified by RN of the patient's/patient's son's request for a sleep aid for this patient. I subsequently placed order for prn melatonin for insomnia.    Babs Bertin, DO Hospitalist

## 2022-05-02 ENCOUNTER — Other Ambulatory Visit (HOSPITAL_COMMUNITY): Payer: Self-pay

## 2022-05-02 DIAGNOSIS — J9601 Acute respiratory failure with hypoxia: Secondary | ICD-10-CM | POA: Diagnosis not present

## 2022-05-02 LAB — COMPREHENSIVE METABOLIC PANEL
ALT: 9 U/L (ref 0–44)
AST: 17 U/L (ref 15–41)
Albumin: 3.2 g/dL — ABNORMAL LOW (ref 3.5–5.0)
Alkaline Phosphatase: 84 U/L (ref 38–126)
Anion gap: 10 (ref 5–15)
BUN: 26 mg/dL — ABNORMAL HIGH (ref 8–23)
CO2: 21 mmol/L — ABNORMAL LOW (ref 22–32)
Calcium: 8.9 mg/dL (ref 8.9–10.3)
Chloride: 102 mmol/L (ref 98–111)
Creatinine, Ser: 1.41 mg/dL — ABNORMAL HIGH (ref 0.61–1.24)
GFR, Estimated: 47 mL/min — ABNORMAL LOW (ref >60)
Glucose, Bld: 104 mg/dL — ABNORMAL HIGH (ref 70–99)
Potassium: 3.7 mmol/L (ref 3.5–5.1)
Sodium: 133 mmol/L — ABNORMAL LOW (ref 135–145)
Total Bilirubin: 0.7 mg/dL (ref 0.3–1.2)
Total Protein: 6.5 g/dL (ref 6.5–8.1)

## 2022-05-02 LAB — PHOSPHORUS: Phosphorus: 3.1 mg/dL (ref 2.5–4.6)

## 2022-05-02 LAB — IRON AND TIBC
Iron: 31 ug/dL — ABNORMAL LOW (ref 45–182)
Saturation Ratios: 8 % — ABNORMAL LOW (ref 17.9–39.5)
TIBC: 379 ug/dL (ref 250–450)
UIBC: 348 ug/dL

## 2022-05-02 LAB — MAGNESIUM: Magnesium: 2 mg/dL (ref 1.7–2.4)

## 2022-05-02 LAB — CBC
HCT: 25.9 % — ABNORMAL LOW (ref 39.0–52.0)
Hemoglobin: 8.3 g/dL — ABNORMAL LOW (ref 13.0–17.0)
MCH: 29.7 pg (ref 26.0–34.0)
MCHC: 32 g/dL (ref 30.0–36.0)
MCV: 92.8 fL (ref 80.0–100.0)
Platelets: 93 10*3/uL — ABNORMAL LOW (ref 150–400)
RBC: 2.79 MIL/uL — ABNORMAL LOW (ref 4.22–5.81)
RDW: 14.2 % (ref 11.5–15.5)
WBC: 5.6 10*3/uL (ref 4.0–10.5)
nRBC: 0 % (ref 0.0–0.2)

## 2022-05-02 LAB — HEPARIN LEVEL (UNFRACTIONATED)
Heparin Unfractionated: 0.22 IU/mL — ABNORMAL LOW (ref 0.30–0.70)
Heparin Unfractionated: 0.29 IU/mL — ABNORMAL LOW (ref 0.30–0.70)

## 2022-05-02 LAB — FERRITIN: Ferritin: 25 ng/mL (ref 24–336)

## 2022-05-02 LAB — FOLATE: Folate: 5.8 ng/mL — ABNORMAL LOW (ref >5.9)

## 2022-05-02 LAB — VITAMIN B12: Vitamin B-12: 264 pg/mL (ref 180–914)

## 2022-05-02 MED ORDER — APIXABAN 2.5 MG PO TABS
2.5000 mg | ORAL_TABLET | Freq: Two times a day (BID) | ORAL | Status: DC
  Administered 2022-05-03: 2.5 mg via ORAL
  Filled 2022-05-02: qty 1

## 2022-05-02 MED ORDER — FUROSEMIDE 40 MG PO TABS
40.0000 mg | ORAL_TABLET | Freq: Every day | ORAL | Status: DC
  Administered 2022-05-02 – 2022-05-03 (×2): 40 mg via ORAL
  Filled 2022-05-02 (×2): qty 1

## 2022-05-02 MED ORDER — VITAMIN B-12 1000 MCG PO TABS
1000.0000 ug | ORAL_TABLET | Freq: Every day | ORAL | Status: DC
  Administered 2022-05-03: 1000 ug via ORAL
  Filled 2022-05-02: qty 1

## 2022-05-02 MED ORDER — SODIUM CHLORIDE 0.9 % IV SOLN
250.0000 mg | Freq: Every day | INTRAVENOUS | Status: AC
  Administered 2022-05-02 – 2022-05-03 (×2): 250 mg via INTRAVENOUS
  Filled 2022-05-02 (×2): qty 20

## 2022-05-02 NOTE — Plan of Care (Signed)
  Problem: Clinical Measurements: ?Goal: Ability to maintain clinical measurements within normal limits will improve ?Outcome: Progressing ?Goal: Will remain free from infection ?Outcome: Progressing ?Goal: Diagnostic test results will improve ?Outcome: Progressing ?Goal: Respiratory complications will improve ?Outcome: Progressing ?Goal: Cardiovascular complication will be avoided ?Outcome: Progressing ?  ?

## 2022-05-02 NOTE — Progress Notes (Signed)
ANTICOAGULATION CONSULT NOTE Pharmacy Consult for Heparin infusion  Indication: pulmonary embolus Brief A/P: Heparin level subtherapeutic Increase Heparin rate  Allergies  Allergen Reactions   Feldene [Piroxicam] Rash    Blistering rash   Neomycin-Polymyxin-Hc Itching   Statins Other (See Comments)    Myalgias   Valium [Diazepam] Other (See Comments)    Dizziness    Latex Rash   Tequin [Gatifloxacin] Anxiety   Vibra-Tab [Doxycycline] Nausea Only    Patient Measurements: Height: 5\' 3"  (160 cm) Weight: 69 kg (152 lb 1.9 oz) IBW/kg (Calculated) : 56.9 Heparin Dosing Weight: 72.9 kg  Vital Signs: Temp: 98.2 F (36.8 C) (01/28 2035) Temp Source: Oral (01/28 2035) BP: 136/68 (01/28 2035) Pulse Rate: 76 (01/28 2035)  Labs: Recent Labs    04/29/22 0710 04/29/22 1501 04/29/22 1824 04/30/22 0129 05/01/22 0136 05/01/22 0141 05/02/22 0211  HGB 7.7* 7.8*  --  7.7*  --  8.2*  --   HCT 23.8* 23.9*  --  23.8*  --  25.0*  --   PLT 104*  --   --  87*  --  97*  --   APTT 178*  --  74* 77*  --   --   --   HEPARINUNFRC 0.95*  --   --  0.43  --  0.32 0.22*  CREATININE  --   --   --  1.79* 1.76*  --  1.41*     Estimated Creatinine Clearance: 29.2 mL/min (A) (by C-G formula based on SCr of 1.41 mg/dL (H)).  Assessment: 89 y.o. male admitted with new PE, h/o Afib and DOAC on hold, for heparin  Goal of Therapy:  Heparin level 0.3-0.7 units/ml aPTT 66-102 seconds Monitor platelets by anticoagulation protocol: Yes   Plan:  Increase Heparin 900 units/hr Check heparin level in 8 hours.   Phillis Knack, PharmD, BCPS  05/02/2022 3:34 AM

## 2022-05-02 NOTE — Care Management Important Message (Signed)
Important Message  Patient Details  Name: ANIS DEGIDIO MRN: [id number] Date of Birth: 03/18/1928   Medicare Important Message Given:  Yes     Shelda Altes 05/02/2022, 10:24 AM

## 2022-05-02 NOTE — Progress Notes (Signed)
ANTICOAGULATION CONSULT NOTE-Follow Up  Pharmacy Consult for Heparin infusion  Indication: pulmonary embolus  Allergies  Allergen Reactions   Feldene [Piroxicam] Rash    Blistering rash   Neomycin-Polymyxin-Hc Itching   Statins Other (See Comments)    Myalgias   Valium [Diazepam] Other (See Comments)    Dizziness    Latex Rash   Tequin [Gatifloxacin] Anxiety   Vibra-Tab [Doxycycline] Nausea Only    Patient Measurements: Height: 5\' 3"  (160 cm) Weight: 68 kg (149 lb 14.6 oz) IBW/kg (Calculated) : 56.9 Heparin Dosing Weight: 72.9 kg  Vital Signs: Temp: 98.1 F (36.7 C) (01/29 0851) Temp Source: Axillary (01/29 0851) BP: 134/77 (01/29 0851) Pulse Rate: 64 (01/29 0851)  Labs: Recent Labs    04/29/22 1501 04/29/22 1824 04/30/22 0129 05/01/22 0136 05/01/22 0141 05/02/22 0211 05/02/22 1214  HGB  --   --  7.7*  --  8.2* 8.3*  --   HCT  --   --  23.8*  --  25.0* 25.9*  --   PLT  --   --  87*  --  97* 93*  --   APTT  --  74* 77*  --   --   --   --   HEPARINUNFRC   < >  --  0.43  --  0.32 0.22* 0.29*  CREATININE  --   --  1.79* 1.76*  --  1.41*  --    < > = values in this interval not displayed.     Estimated Creatinine Clearance: 26.9 mL/min (A) (by C-G formula based on SCr of 1.41 mg/dL (H)).   Medical History: Past Medical History:  Diagnosis Date   Adenocarcinoma of right lung ([place]) 01/06/2016   Anginal chest pain at rest    Chronic non, controlled on Lorazepam   Anxiety    Arthritis    "all over"    BPH (benign prostatic hyperplasia)    CAD (coronary artery disease)    Chronic lower back pain    Complication of anesthesia    "problems making water afterwards"   Depression    DJD (degenerative joint disease)    Esophageal ulcer without bleeding    bid ppi indefinitely   Family history of adverse reaction to anesthesia    "children w/PONV"   GERD (gastroesophageal reflux disease)    Headache    "couple/week maybe" (09/04/2015)   Hemochromatosis     Possible, elevated iron stores, hook-like osteophytes on hand films, normal LFTs   Hepatitis    "yellow jaundice as a baby"   History of ASCVD    MULTIVESSEL   Hyperlipidemia    Hypertension    Mild aortic stenosis 06/23/2021   Osteoarthritis    Pneumonia    "several times; got a little now" (09/04/2015)   Rheumatoid arthritis ([place])    Subdural hematoma ([place])    small after fall 09/2016-plavix held, neurosurgery consulted.  Asymptomatic.     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   04/27/2022   Cholecalciferol (VITAMIN D-3 PO) Take 1 capsule by mouth in the morning.   04/27/2022   finasteride (PROSCAR) 5 MG tablet Take 1 tablet (5 mg total) by mouth daily. 90 tablet 3 04/27/2022  FLUoxetine (PROZAC) 20 MG capsule TAKE 1 CAPSULE EVERY DAY (Patient taking differently: Take 20 mg by mouth in the morning.) 90 capsule 0 04/27/2022   furosemide (LASIX) 20 MG tablet Take 1 tablet (20 mg total) by mouth daily. (Patient taking differently: Take 20 mg by mouth in the morning.) 90 tablet 3 04/27/2022   Lifitegrast (XIIDRA) 5 % SOLN Place 1 drop into both eyes 2 (two) times daily. (Patient taking differently: Place 1 drop into both eyes 2 (two) times daily as needed (dry eyes).) 60 each 12 Past Month   metoprolol tartrate (LOPRESSOR) 25 MG tablet TAKE 1/2 TABLET TWICE DAILY (Patient taking differently: Take 12.5 mg by mouth daily.) 90 tablet 3 04/27/2022 at 0800   nitroGLYCERIN (NITROSTAT) 0.4 MG SL tablet Place 1 tablet (0.4 mg total) under the tongue every 5 (five) minutes as needed for chest pain. 25 tablet 2 UNK   pantoprazole (PROTONIX) 40 MG tablet Take 1 tablet (40 mg total) by mouth in the morning. 90 tablet 0 04/27/2022   polyethylene  glycol (MIRALAX / GLYCOLAX) 17 g packet Take 17 g by mouth daily as needed for mild constipation. 14 each 0 Past Month   rosuvastatin (CRESTOR) 5 MG tablet TAKE 1 TABLET EVERY DAY (Patient taking differently: Take 5 mg by mouth daily with supper.) 90 tablet 1 04/26/2022   tamsulosin (FLOMAX) 0.4 MG CAPS capsule TAKE 1 CAPSULE EVERY DAY (Patient taking differently: Take 0.4 mg by mouth daily with supper.) 90 capsule 10 04/26/2022   XARELTO 15 MG TABS tablet TAKE 1 TABLET EVERY DAY WITH SUPPER (Patient taking differently: Take 15 mg by mouth daily with supper.) 30 tablet 10 04/26/2022 at 1800   ZINC OXIDE EX Apply 1 application  topically 4 (four) times daily as needed (skin barrier).   Past Month    Assessment: 89 yo M with history of atrial fibrillation and VTE on rivaroxaban 15mg  every evening with dinner. Last dose of rivaroxaban was 04/27/22 evening. CT PE positive for small acute PE in subsegmental pulomonary artery of RLL with evidence of chronic right heart strain. Pharmacy consulted to initiate and dose heparin for acute PE. After aPTT and heparin level correlated, switched back to heparin level  Heparin level therapeutic at 0.3 on heparin drip 900 uts/hr. Hgb low stable 7-8, and plts low stable 80-90 No signs/symptoms of bleeding reported. Noted Oncology note plan to dc patient home on apixaban 2.5mg  BID  Goal of Therapy:  Heparin level 0.3-0.7 units/ml aPTT 66-102 seconds Monitor platelets by anticoagulation protocol: Yes   Plan:  Continue heparin drip 900 units/h Daily cbc and heparin level Monitor signs/symptoms of bleeding  Change to PO anticoagulation prior to [place].D. CPP, BCPS Clinical Pharmacist [phone] 05/02/2022 2:10 PM

## 2022-05-02 NOTE — Progress Notes (Signed)
ANTICOAGULATION CONSULT NOTE-Follow Up  Pharmacy Consult for Heparin infusion  Indication: pulmonary embolus  Allergies  Allergen Reactions   Feldene [Piroxicam] Rash    Blistering rash   Neomycin-Polymyxin-Hc Itching   Statins Other (See Comments)    Myalgias   Valium [Diazepam] Other (See Comments)    Dizziness    Latex Rash   Tequin [Gatifloxacin] Anxiety   Vibra-Tab [Doxycycline] Nausea Only    Patient Measurements: Height: 5\' 3"  (160 cm) Weight: 68 kg (149 lb 14.6 oz) IBW/kg (Calculated) : 56.9 Heparin Dosing Weight: 72.9 kg  Vital Signs: Temp: 97.8 F (36.6 C) (01/29 1559) Temp Source: Oral (01/29 1559) BP: 129/84 (01/29 1559) Pulse Rate: 75 (01/29 1559)  Labs: Recent Labs    04/29/22 1824 04/30/22 0129 04/30/22 0129 05/01/22 0136 05/01/22 0141 05/02/22 0211 05/02/22 1214  HGB  --  7.7*   < >  --  8.2* 8.3*  --   HCT  --  23.8*  --   --  25.0* 25.9*  --   PLT  --  87*  --   --  97* 93*  --   APTT 74* 77*  --   --   --   --   --   HEPARINUNFRC  --  0.43   < >  --  0.32 0.22* 0.29*  CREATININE  --  1.79*  --  1.76*  --  1.41*  --    < > = values in this interval not displayed.     Estimated Creatinine Clearance: 26.9 mL/min (A) (by C-G formula based on SCr of 1.41 mg/dL (H)).   Medical History: Past Medical History:  Diagnosis Date   Adenocarcinoma of right lung ([place]) 01/06/2016   Anginal chest pain at rest    Chronic non, controlled on Lorazepam   Anxiety    Arthritis    "all over"    BPH (benign prostatic hyperplasia)    CAD (coronary artery disease)    Chronic lower back pain    Complication of anesthesia    "problems making water afterwards"   Depression    DJD (degenerative joint disease)    Esophageal ulcer without bleeding    bid ppi indefinitely   Family history of adverse reaction to anesthesia    "children w/PONV"   GERD (gastroesophageal reflux disease)    Headache    "couple/week maybe" (09/04/2015)   Hemochromatosis     Possible, elevated iron stores, hook-like osteophytes on hand films, normal LFTs   Hepatitis    "yellow jaundice as a baby"   History of ASCVD    MULTIVESSEL   Hyperlipidemia    Hypertension    Mild aortic stenosis 06/23/2021   Osteoarthritis    Pneumonia    "several times; got a little now" (09/04/2015)   Rheumatoid arthritis ([place])    Subdural hematoma ([place])    small after fall 09/2016-plavix held, neurosurgery consulted.  Asymptomatic.     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   04/27/2022   Cholecalciferol (VITAMIN D-3 PO) Take 1 capsule by mouth in the morning.   04/27/2022   finasteride (PROSCAR) 5 MG tablet Take 1 tablet (5 mg total) by mouth daily. 90 tablet 3 04/27/2022  FLUoxetine (PROZAC) 20 MG capsule TAKE 1 CAPSULE EVERY DAY (Patient taking differently: Take 20 mg by mouth in the morning.) 90 capsule 0 04/27/2022   furosemide (LASIX) 20 MG tablet Take 1 tablet (20 mg total) by mouth daily. (Patient taking differently: Take 20 mg by mouth in the morning.) 90 tablet 3 04/27/2022   Lifitegrast (XIIDRA) 5 % SOLN Place 1 drop into both eyes 2 (two) times daily. (Patient taking differently: Place 1 drop into both eyes 2 (two) times daily as needed (dry eyes).) 60 each 12 Past Month   metoprolol tartrate (LOPRESSOR) 25 MG tablet TAKE 1/2 TABLET TWICE DAILY (Patient taking differently: Take 12.5 mg by mouth daily.) 90 tablet 3 04/27/2022 at 0800   nitroGLYCERIN (NITROSTAT) 0.4 MG SL tablet Place 1 tablet (0.4 mg total) under the tongue every 5 (five) minutes as needed for chest pain. 25 tablet 2 UNK   pantoprazole (PROTONIX) 40 MG tablet Take 1 tablet (40 mg total) by mouth in the morning. 90 tablet 0 04/27/2022   polyethylene  glycol (MIRALAX / GLYCOLAX) 17 g packet Take 17 g by mouth daily as needed for mild constipation. 14 each 0 Past Month   rosuvastatin (CRESTOR) 5 MG tablet TAKE 1 TABLET EVERY DAY (Patient taking differently: Take 5 mg by mouth daily with supper.) 90 tablet 1 04/26/2022   tamsulosin (FLOMAX) 0.4 MG CAPS capsule TAKE 1 CAPSULE EVERY DAY (Patient taking differently: Take 0.4 mg by mouth daily with supper.) 90 capsule 10 04/26/2022   XARELTO 15 MG TABS tablet TAKE 1 TABLET EVERY DAY WITH SUPPER (Patient taking differently: Take 15 mg by mouth daily with supper.) 30 tablet 10 04/26/2022 at 1800   ZINC OXIDE EX Apply 1 application  topically 4 (four) times daily as needed (skin barrier).   Past Month    Assessment: 89 yo M with history of atrial fibrillation and VTE on rivaroxaban 15mg  every evening with dinner. Last dose of rivaroxaban was 04/27/22 evening. CT PE positive for small acute PE in subsegmental pulomonary artery of RLL with evidence of chronic right heart strain. Pharmacy consulted to initiate and dose heparin for acute PE. After aPTT and heparin level correlated, switched back to heparin level  Heparin level therapeutic at 0.3 on heparin drip 900 uts/hr. Hgb low stable 7-8, and plts low stable 80-90 No signs/symptoms of bleeding reported. Noted Oncology note plan to dc patient home on apixaban 2.5mg  BID  Goal of Therapy:  Heparin level 0.3-0.7 units/ml Monitor platelets by anticoagulation protocol: Yes   Plan:  Apixaban 2.5mg  po BID starting 1/30 at 10am Continue heparin drip 900 units/hr until first dose of apixaban given 1/30 a.m. Merry Proud to check prices for apixaban early 1/30  Sherlon Handing, PharmD, BCPS Please see amion for complete clinical pharmacist phone list 05/02/2022 4:38 PM

## 2022-05-02 NOTE — Progress Notes (Signed)
PROGRESS NOTE    Evan [name]  SHF:[id number] DOB: [birth year] DOA: 04/27/2022 PCP: [name], MD  Chief Complaint  Patient presents with   Chest Pain   Shortness of Breath    Brief Narrative:  Evan [name] is Evan [name] 89 y.o. male with extensive medical history significant of VD, CKD 3A, radiation pneumonitis, stroke (thalamic hemorrhage) with residual hemiparesis, CAD status post CABG, PE, atrial fibrillation, hyperlipidemia, anxiety, depression, hypertension, lung cancer, HFrEF, pericarditis, GERD, COPD, BPH, dementia, and tremor who presents with complaints of progressively worsening shortness of breath over the last week.Marland Kitchen  History is obtained mostly from the patient's son who is present at bedside due to the patient having dementia.  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  Patient has had Apolo Cutshaw productive cough and orthopnea.  Family reports that they have been checking his oxygenation at home and noted that had been stable.   Patient's son notes that he had recently had Samie Reasons fall hitting the right side of his temple about 2 weeks ago.  Review of records note patient was supposed to be on enoxaparin lifelong after failing apixaban in 2019, but appears to have been switched to Xarelto after patient had an intracranial bleed in 2020.  Currently, appears to have been on Xarelto 15 mg which is the appropriate dose for atrial fibrillation, but not necessarily for history of VTE 20 mg daily.  Family note that the patient has been taking the Xarelto with food.   In the emergency department patient was noted to be afebrile with pulse 40-64, respirations 14-22, blood pressures are maintaining O2 saturations as low as 87% with improvement on 2 L of nasal cannula oxygen.  Labs from 1/24 significant for hemoglobin 8.6, sodium 132, BUN 24, creatinine 1.48,  high-sensitivity troponin 16->19, and BNP 683.2.  Chest x-ray was unremarkable.  Urinalysis did not show any  acute abnormality.  Respiratory virus panel screening were negative.  CT angiogram of the chest significant for small acute pulmonary embolus with in posterior segmental pulmonary artery of the right lower lobe with evidence of right heart strain that appear to be more chronic in nature, mild interstitial pulmonary edema with small bilateral pleural effusions, and consolidation in the posterior basal right lower lobe concerning for possible pulmonary infarction.  Patient has been given furosemide 40 mg IV and Humbert Morozov DuoNeb breathing treatment.  Assessment & Plan:   Principal Problem:   Acute respiratory failure with hypoxia (HCC) Active Problems:   HFmrEF (heart failure with mildly reduced ejection fraction) (HCC)   Acute pulmonary embolism (HCC)   History of pulmonary embolism   Elevated troponin   Persistent atrial fibrillation (HCC)   BPH (benign prostatic hyperplasia)   Hypokalemia  Acute respiratory failure with hypoxia secondary to heart failure with reduced EF Acute on chronic.  Patient presented with complaints of worsening shortness of breath.  Found to be hypoxic down to 87% on room air with improvement on 2 L nasal cannula oxygen.  CT angiogram of the chest noted concern for mild pulmonary edema with small bilateral pleural effusions and Krista Som pulmonary embolism.  Right heart strain was noted but thought to be more so chronic in nature. BNP elevated at 683.2.  Last echocardiogram noted EF to be 45-50% with indeterminate diastolic parameters back in 04/2021.  Patient has been given Lasix 40 mg IV. -Strict I&O's and daily weights -CXR with mild edema, cardiomegaly -CXR with no active disease -  start oral lasix 40 mg daily (this is twice his home dose, follow how he does on this) -Check echocardiogram - EF 50-55%, mildly reduced RVSF   Pulmonary embolism History of pulmonary embolism Acute.  Patient had [employer] prior history of pulmonary embolism and had been on Xarelto for anticoagulation.  Records  note that he had previously failed Eliquis back in 08/2018 and has been recommended to be on lifelong enoxaparin.  Family reports compliance with Xarelto, but he was on 15mg  daily and not the 20 recommended for VTE treatment. -Heparin drip per pharmacy -appreciate hematology recs - Dr. [name] recommending eliquis 2.5 mg BID at discharge.  Complex situation overall, there's not Stoney Karczewski perfect solution.  Appreciate hematology's assistance.   Chills  Rigors Focal Consolidation within Posterior Basal RLL improving CT did show findings concerning for focal consolidation, will treat for possible pneumonia - could be pulm infarct Follow blood cultures  Elevated troponin Acute.  Patient had noted some complaints of chest pain.  High-sensitivity troponin was mildly elevated 16->19 but essentially flat.  EKG without significant ischemic changes.  Suspect secondary to demand in setting of above. -Continue to monitor   Persistent atrial fibrillation on chronic anticoagulation -Goal potassium at least 4 and magnesium at least 2 -Anticoagulation as noted above   Anemia Iron def -> IV iron ordered B12 low normal - follow MMA, will start b12 supplementation as well Follow folate   Essential hypertension Blood pressures currently maintained. -Held metoprolol due to low heart rates   CKD stage IIIb Creatinine 1.41 today (baseline appears to be 1.3-1.5)   History of thalamic hemorrhage Patient had been on therapeutic Lovenox when he developed Ciarrah Rae left thalamic intraparenchymal hemorrhage in 2020   History of lung cancer Prior history of adenocarcinoma of the right lung status post radiation back in 2017   Mood disorder/depression -Continue Prozac   Hyperlipidemia -Continue Crestor   BPH -Continue finasteride and tamsulosin   Stage I pressure ulcer coccyx -Continue pressure dressing Pressure Injury 04/28/22 Sacrum Mid Stage 1 -  Intact skin with non-blanchable redness of Dali Kraner localized area usually  over Ever Gustafson bony prominence. (Active)  04/28/22 2030  Location: Sacrum  Location Orientation: Mid  Staging: Stage 1 -  Intact skin with non-blanchable redness of Kenndra Morris localized area usually over Travis Purk bony prominence.  Wound Description (Comments):   Present on Admission: Yes      DVT prophylaxis: heparin gtt Code Status: full Family Communication: son at bedside Disposition:   Status is: Inpatient Remains inpatient appropriate because: continued inpatient care   Consultants:  Heme/onc  Procedures:  Echo IMPRESSIONS     1. Assessing wall motion is challenging. Left ventricular ejection  fraction, by estimation, is 50 to 55%. The left ventricle has low normal  function.   2. Right ventricular systolic function is mildly reduced. The right  ventricular size is moderately enlarged. There is moderately elevated  pulmonary artery systolic pressure.   3. Left atrial size was moderately dilated.   4. Trivial mitral valve regurgitation.   5. Aortic valve regurgitation is not visualized.   6. The inferior vena cava is normal in size with <50% respiratory  variability, suggesting right atrial pressure of 8 mmHg.   LE [place] Summary:  BILATERAL:  - No evidence of deep vein thrombosis seen in the lower extremities,  bilaterally.  -No evidence of popliteal cyst, bilaterally.   Antimicrobials:  Anti-infectives (From admission, onward)    Start     Dose/Rate Route Frequency Ordered Stop  04/29/22 1815  cefTRIAXone (ROCEPHIN) 1 g in sodium chloride 0.9 % 100 mL IVPB        1 g 200 mL/hr over 30 Minutes Intravenous Every 24 hours 04/29/22 1718 05/04/22 1814       Subjective: Son at bedside Patient feels better today  Objective: Vitals:   05/01/22 2035 05/02/22 0451 05/02/22 0851 05/02/22 1559  BP: 136/68 129/70 134/77 129/84  Pulse: 76 71 64 75  Resp: 20 20 20 20   Temp: 98.2 F (36.8 C) 97.8 F (36.6 C) 98.1 F (36.7 C) 97.8 F (36.6 C)  TempSrc: Oral Oral Axillary Oral   SpO2: 97% 96% 98% 94%  Weight:  68 kg    Height:        Intake/Output Summary (Last 24 hours) at 05/02/2022 1618 Last data filed at 05/02/2022 1600 Gross per 24 hour  Intake 731.7 ml  Output 1000 ml  Net -268.3 ml   Filed Weights   04/29/22 0345 04/30/22 0420 05/02/22 0451  Weight: 67 kg 69 kg 68 kg    Examination:  General exam: Appears calm and comfortable - looks better overall, brighter today Respiratory system: diminished, no adventitious lung sounds appreciated Cardiovascular system: S1 & S2 heard, RRR Gastrointestinal system: Abdomen is nondistended, soft and nontender. Central nervous system: Alert and oriented. No focal neurological deficits. Extremities: no LEE  Data Reviewed: I have personally reviewed following labs and imaging studies  CBC: Recent Labs  Lab 04/27/22 1643 04/29/22 0710 04/29/22 1501 04/30/22 0129 05/01/22 0141 05/02/22 0211  WBC 5.8 5.2  --  5.0 5.8 5.6  NEUTROABS 3.6  --   --   --   --   --   HGB 8.6* 7.7* 7.8* 7.7* 8.2* 8.3*  HCT 26.7* 23.8* 23.9* 23.8* 25.0* 25.9*  MCV 95.0 91.5  --  91.9 91.6 92.8  PLT 115* 104*  --  87* 97* 93*    Basic Metabolic Panel: Recent Labs  Lab 04/27/22 1643 04/29/22 0551 04/30/22 0129 05/01/22 0136 05/02/22 0211  NA 132* 134* 135 134* 133*  K 4.1 3.8 3.7 3.9 3.7  CL 103 105 102 103 102  CO2 20* 21* 22 21* 21*  GLUCOSE 103* 98 110* 111* 104*  BUN 24* 23 25* 29* 26*  CREATININE 1.48* 1.48* 1.79* 1.76* 1.41*  CALCIUM 8.7* 8.5* 8.6* 8.8* 8.9  MG  --  2.0 2.0  --  2.0  PHOS  --   --  4.7*  --  3.1    GFR: Estimated Creatinine Clearance: 26.9 mL/min (Kristion Holifield) (by C-G formula based on SCr of 1.41 mg/dL (H)).  Liver Function Tests: Recent Labs  Lab 04/30/22 0129 05/02/22 0211  AST 16 17  ALT 8 9  ALKPHOS 86 84  BILITOT 0.6 0.7  PROT 6.4* 6.5  ALBUMIN 3.1* 3.2*    CBG: No results for input(s): "GLUCAP" in the last 168 hours.   Recent Results (from the past 240 hour(s))  Resp panel by  RT-PCR (RSV, Flu Tron Flythe&B, Covid) Anterior Nasal Swab     Status: None   Collection Time: 04/28/22  1:53 AM   Specimen: Anterior Nasal Swab  Result Value Ref Range Status   SARS Coronavirus 2 by RT PCR NEGATIVE NEGATIVE Final    Comment: (NOTE) SARS-CoV-2 target nucleic acids are NOT DETECTED.  The SARS-CoV-2 RNA is generally detectable in upper respiratory specimens during the acute phase of infection. The lowest concentration of SARS-CoV-2 viral copies this assay can detect is 138 copies/mL. Na Waldrip negative  result does not preclude SARS-Cov-2 infection and should not be used as the sole basis for treatment or other patient management decisions. Earnie Bechard negative result may occur with  improper specimen collection/handling, submission of specimen other than nasopharyngeal swab, presence of viral mutation(s) within the areas targeted by this assay, and inadequate number of viral copies(<138 copies/mL). Augustine Brannick negative result must be combined with clinical observations, patient history, and epidemiological information. The expected result is Negative.  Fact Sheet for Patients:  EntrepreneurPulse.com.au  Fact Sheet for Healthcare Providers:  IncredibleEmployment.be  This test is no t yet approved or cleared by the [place] FDA and  has been authorized for detection and/or diagnosis of SARS-CoV-2 by FDA under an Emergency Use Authorization (EUA). This EUA will remain  in effect (meaning this test can be used) for the duration of the COVID-19 declaration under Section 564(b)(1) of the Act, 21 U.S.C.section 360bbb-3(b)(1), unless the authorization is terminated  or revoked sooner.       Influenza Juhi Lagrange by PCR NEGATIVE NEGATIVE Final   Influenza B by PCR NEGATIVE NEGATIVE Final    Comment: (NOTE) The Xpert Xpress SARS-CoV-2/FLU/RSV plus assay is intended as an aid in the diagnosis of influenza from Nasopharyngeal swab specimens and should not be used as Emmitte Surgeon sole basis  for treatment. Nasal washings and aspirates are unacceptable for Xpert Xpress SARS-CoV-2/FLU/RSV testing.  Fact Sheet for Patients: EntrepreneurPulse.com.au  Fact Sheet for Healthcare Providers: IncredibleEmployment.be  This test is not yet approved or cleared by the [place] FDA and has been authorized for detection and/or diagnosis of SARS-CoV-2 by FDA under an Emergency Use Authorization (EUA). This EUA will remain in effect (meaning this test can be used) for the duration of the COVID-19 declaration under Section 564(b)(1) of the Act, 21 U.S.C. section 360bbb-3(b)(1), unless the authorization is terminated or revoked.     Resp Syncytial Virus by PCR NEGATIVE NEGATIVE Final    Comment: (NOTE) Fact Sheet for Patients: EntrepreneurPulse.com.au  Fact Sheet for Healthcare Providers: IncredibleEmployment.be  This test is not yet approved or cleared by the [place] FDA and has been authorized for detection and/or diagnosis of SARS-CoV-2 by FDA under an Emergency Use Authorization (EUA). This EUA will remain in effect (meaning this test can be used) for the duration of the COVID-19 declaration under Section 564(b)(1) of the Act, 21 U.S.C. section 360bbb-3(b)(1), unless the authorization is terminated or revoked.  Performed at [facility], [place] [address], [place], [place] [address]   Culture, blood (Routine X 2) w Reflex to ID Panel     Status: None (Preliminary result)   Collection Time: 04/29/22  3:03 PM   Specimen: BLOOD LEFT WRIST  Result Value Ref Range Status   Specimen Description BLOOD LEFT WRIST  Final   Special Requests   Final    BOTTLES DRAWN AEROBIC AND ANAEROBIC Blood Culture adequate volume   Culture   Final    NO GROWTH 3 DAYS Performed at [facility], [place] [address] [place], [place] [address]    Report Status PENDING  Incomplete  Culture, blood (Routine X 2) w  Reflex to ID Panel     Status: None (Preliminary result)   Collection Time: 04/29/22  3:03 PM   Specimen: BLOOD RIGHT HAND  Result Value Ref Range Status   Specimen Description BLOOD RIGHT HAND  Final   Special Requests   Final    BOTTLES DRAWN AEROBIC AND ANAEROBIC Blood Culture results may not be optimal due to  an inadequate volume of blood received in culture bottles   Culture   Final    NO GROWTH 3 DAYS Performed at [facility], [place] [address] [place], [place] [address]    Report Status PENDING  Incomplete         Radiology Studies: DG CHEST PORT 1 VIEW  Result Date: 05/01/2022 CLINICAL DATA:  Shortness of breath and chest pain, initial encounter EXAM: PORTABLE CHEST 1 VIEW COMPARISON:  04/30/2022 FINDINGS: Cardiac shadow is stable. Postsurgical changes are again seen. The lungs are well aerated bilaterally. No focal infiltrate is seen. No bony abnormality is noted. IMPRESSION: No active disease. Electronically Signed   By: Inez Catalina M.D.   On: 05/01/2022 17:14        Scheduled Meds:  finasteride  5 mg Oral Daily   FLUoxetine  20 mg Oral q AM   furosemide  40 mg Oral Daily   hydrocortisone cream   Topical BID   loratadine  10 mg Oral Daily   rosuvastatin  5 mg Oral Q supper   sodium chloride flush  3 mL Intravenous Q12H   tamsulosin  0.4 mg Oral Q supper   Continuous Infusions:  cefTRIAXone (ROCEPHIN)  IV 1 g (05/01/22 1644)   heparin 900 Units/hr (05/02/22 1600)     LOS: 4 days    Time spent: over 30 min    Fayrene Helper, MD [facility]   To contact the attending provider between 7A-7P or the covering provider during after hours 7P-7A, please log into the web site [email or web address] and access using universal [place] password for that web site. If you do not have the password, please call the hospital operator.  05/02/2022, 4:18 PM

## 2022-05-02 NOTE — Progress Notes (Signed)
Occupational Therapy Treatment Patient Details Name: Evan [name] MRN: [id number] DOB: 11/27/1927 Today's Date: 05/02/2022   History of present illness 89 y.o. male admitted 1/24 with  Acute respiratory failure with hypoxia, found to have acute pulmonary embolism. PMH: HTN, HLD, GERD, DJD, OA, RA, Hearing Loss, CAD s/p CABG, adenocarcinoma of right lung, Anxiety, A fib, SDH 09/2016, chronic thrombocytopenia, left thalamic hemorrhage 07/2018 with residual R hemiparesis, lumbar fracture December 2020, CKD stage IIIa, hx of PE   OT comments  Pt progressing towards goals this session, needing mod A for UB ADL and set up A for seated grooming task. Pt min A for bed mobility and mod A for step pivot transfer with chair placed on pt's L side. VSS throughout session. Pt presenting with impairments listed below, will follow acutely. Recommend HHOT at d/c as long as family can provide 24/7 assist.   Recommendations for follow up therapy are one component of a multi-disciplinary discharge planning process, led by the attending physician.  Recommendations may be updated based on patient status, additional functional criteria and insurance authorization.    Follow Up Recommendations  Home health OT (if family continues to provide 24/7 assist)     Assistance Recommended at Discharge Frequent or constant Supervision/Assistance  Patient can return home with the following  A lot of help with walking and/or transfers;A lot of help with bathing/dressing/bathroom;Assistance with cooking/housework;Direct supervision/assist for medications management;Direct supervision/assist for financial management;Assist for transportation;Help with stairs or ramp for entrance   Equipment Recommendations  None recommended by OT (pt has all needed DME)    Recommendations for Other Services PT consult    Precautions / Restrictions Precautions Precautions: Fall Precaution Comments: residual R hemiparesis Restrictions Weight  Bearing Restrictions: No       Mobility Bed Mobility Overal bed mobility: Needs Assistance Bed Mobility: Sidelying to Sit   Sidelying to sit: Min assist       General bed mobility comments: able to scoot self to EOB with min cues    Transfers Overall transfer level: Needs assistance Equipment used: 1 person hand held assist Transfers: Sit to/from Stand, Bed to chair/wheelchair/BSC Sit to Stand: Mod assist     Step pivot transfers: Mod assist     General transfer comment: step pivot to pt's L side to chair     Balance Overall balance assessment: Needs assistance Sitting-balance support: Single extremity supported, Feet supported Sitting balance-[name] Scale: Fair Sitting balance - Comments: can reach minimally outside BOS without LOB   Standing balance support: During functional activity, Bilateral upper extremity supported Standing balance-[name] Scale: Poor Standing balance comment: reliant on external support                           ADL either performed or assessed with clinical judgement   ADL Overall ADL's : Needs assistance/impaired     Grooming: Sitting;Wash/dry face;Set up Grooming Details (indicate cue type and reason): washing face sitting up in chair         Upper Body Dressing : Sitting;Moderate assistance Upper Body Dressing Details (indicate cue type and reason): donning clean gown     Toilet Transfer: Moderate assistance;Stand-pivot;BSC/3in1           Functional mobility during ADLs: Rolling walker (2 wheels);Cueing for safety;Cueing for sequencing;Moderate assistance      Extremity/Trunk Assessment Upper Extremity Assessment Upper Extremity Assessment: RUE deficits/detail RUE Deficits / Details: uncoordinated movement, RUE shoulder flex ~90*, can flex/ext elbow, 2/5 grasp  RUE Coordination: decreased fine motor;decreased gross motor   Lower Extremity Assessment Lower Extremity Assessment: Defer to PT evaluation         Vision   Vision Assessment?: No apparent visual deficits   Perception Perception Perception: Not tested   Praxis Praxis Praxis: Not tested    Cognition Arousal/Alertness: Awake/alert Behavior During Therapy: WFL for tasks assessed/performed Overall Cognitive Status: History of cognitive impairments - at baseline                                 General Comments: needs increased cues/ repetition to follow commands, hx of dementia        Exercises      Shoulder Instructions       General Comments VSS on RA    Pertinent Vitals/ Pain       Pain Assessment Pain Assessment: No/denies pain  Home Living                                          Prior Functioning/Environment              Frequency  Min 2X/week        Progress Toward Goals  OT Goals(current goals can now be found in the care plan section)  Progress towards OT goals: Progressing toward goals  Acute Rehab OT Goals Patient Stated Goal: none stated OT Goal Formulation: With patient Time For Goal Achievement: 05/14/22 Potential to Achieve Goals: Good ADL Goals Pt Will Perform Grooming: sitting;with min assist Pt Will Perform Upper Body Dressing: sitting;with min assist Pt Will Transfer to Toilet: with min assist;ambulating;regular height toilet  Plan Discharge plan remains appropriate;Frequency remains appropriate    Co-evaluation                 AM-PAC OT "6 Clicks" Daily Activity     Outcome Measure   Help from another person eating meals?: A Little Help from another person taking care of personal grooming?: A Little Help from another person toileting, which includes using toliet, bedpan, or urinal?: A Lot Help from another person bathing (including washing, rinsing, drying)?: A Lot Help from another person to put on and taking off regular upper body clothing?: A Lot Help from another person to put on and taking off regular lower body clothing?: A  Lot 6 Click Score: 14    End of Session    OT Visit Diagnosis: Muscle weakness (generalized) (M62.81);Unsteadiness on feet (R26.81);Other abnormalities of gait and mobility (R26.89)   Activity Tolerance Patient tolerated treatment well   Patient Left in chair;with call bell/phone within reach;with family/visitor present   Nurse Communication Mobility status        Time: 1537-1600 OT Time Calculation (min): 23 min  Charges: OT General Charges $OT Visit: 1 Visit OT Treatments $Self Care/Home Management : 8-22 mins  Renaye Rakers, OTD, OTR/L SecureChat Preferred Acute Rehab [phone] [place] 05/02/2022, 4:50 PM

## 2022-05-02 NOTE — Progress Notes (Signed)
PT Cancellation Note  Patient Details Name: Evan [name] MRN: [id number] DOB: 1927/05/06   Cancelled Treatment:    Reason Eval/Treat Not Completed: Fatigue/lethargy limiting ability to participate - pt just got back in bed after being up in chair, states he is too fatigued to participate in OOB mobility at this time. PT to check back tomorrow.   Stacie Glaze, PT DPT Acute Rehabilitation Services Pager [phone]  Office [phone]    [name] Matte 05/02/2022, 5:18 PM

## 2022-05-03 ENCOUNTER — Other Ambulatory Visit (HOSPITAL_COMMUNITY): Payer: Self-pay

## 2022-05-03 DIAGNOSIS — J9601 Acute respiratory failure with hypoxia: Secondary | ICD-10-CM | POA: Diagnosis not present

## 2022-05-03 LAB — BASIC METABOLIC PANEL
Anion gap: 9 (ref 5–15)
BUN: 23 mg/dL (ref 8–23)
CO2: 23 mmol/L (ref 22–32)
Calcium: 8.8 mg/dL — ABNORMAL LOW (ref 8.9–10.3)
Chloride: 103 mmol/L (ref 98–111)
Creatinine, Ser: 1.28 mg/dL — ABNORMAL HIGH (ref 0.61–1.24)
GFR, Estimated: 53 mL/min — ABNORMAL LOW (ref >60)
Glucose, Bld: 93 mg/dL (ref 70–99)
Potassium: 4.3 mmol/L (ref 3.5–5.1)
Sodium: 135 mmol/L (ref 135–145)

## 2022-05-03 LAB — CBC
HCT: 25.8 % — ABNORMAL LOW (ref 39.0–52.0)
Hemoglobin: 8 g/dL — ABNORMAL LOW (ref 13.0–17.0)
MCH: 29.2 pg (ref 26.0–34.0)
MCHC: 31 g/dL (ref 30.0–36.0)
MCV: 94.2 fL (ref 80.0–100.0)
Platelets: 95 10*3/uL — ABNORMAL LOW (ref 150–400)
RBC: 2.74 MIL/uL — ABNORMAL LOW (ref 4.22–5.81)
RDW: 14.4 % (ref 11.5–15.5)
WBC: 5.9 10*3/uL (ref 4.0–10.5)
nRBC: 0 % (ref 0.0–0.2)

## 2022-05-03 LAB — HEPARIN LEVEL (UNFRACTIONATED): Heparin Unfractionated: 0.41 IU/mL (ref 0.30–0.70)

## 2022-05-03 MED ORDER — FOLIC ACID 1 MG PO TABS
1.0000 mg | ORAL_TABLET | Freq: Every day | ORAL | 1 refills | Status: DC
  Filled 2022-05-03: qty 30, 30d supply, fill #0
  Filled 2022-05-29: qty 30, 30d supply, fill #1

## 2022-05-03 MED ORDER — ZINC OXIDE 12.8 % EX OINT
TOPICAL_OINTMENT | Freq: Four times a day (QID) | CUTANEOUS | 0 refills | Status: DC | PRN
  Filled 2022-05-03: qty 56.7, fill #0

## 2022-05-03 MED ORDER — APIXABAN 2.5 MG PO TABS
2.5000 mg | ORAL_TABLET | Freq: Two times a day (BID) | ORAL | 1 refills | Status: DC
  Filled 2022-05-03: qty 60, 30d supply, fill #0
  Filled 2022-05-29: qty 60, 30d supply, fill #1

## 2022-05-03 MED ORDER — SODIUM CHLORIDE 0.9 % IV SOLN
1.0000 g | Freq: Once | INTRAVENOUS | Status: AC
  Administered 2022-05-03: 1 g via INTRAVENOUS
  Filled 2022-05-03: qty 10

## 2022-05-03 MED ORDER — FOLIC ACID 1 MG PO TABS
1.0000 mg | ORAL_TABLET | Freq: Every day | ORAL | Status: DC
  Administered 2022-05-03: 1 mg via ORAL
  Filled 2022-05-03: qty 1

## 2022-05-03 MED ORDER — CYANOCOBALAMIN 1000 MCG PO TABS
1000.0000 ug | ORAL_TABLET | Freq: Every day | ORAL | 1 refills | Status: AC
  Filled 2022-05-03: qty 30, 30d supply, fill #0

## 2022-05-03 MED ORDER — FERROUS SULFATE 325 (65 FE) MG PO TABS
325.0000 mg | ORAL_TABLET | ORAL | 1 refills | Status: DC
  Filled 2022-05-03: qty 15, 30d supply, fill #0
  Filled 2022-05-29: qty 15, 30d supply, fill #1

## 2022-05-03 MED ORDER — FUROSEMIDE 40 MG PO TABS
40.0000 mg | ORAL_TABLET | Freq: Every day | ORAL | 0 refills | Status: DC
  Filled 2022-05-03: qty 30, 30d supply, fill #0

## 2022-05-03 NOTE — Plan of Care (Signed)
  Problem: Clinical Measurements: ?Goal: Ability to maintain clinical measurements within normal limits will improve ?Outcome: Progressing ?Goal: Will remain free from infection ?Outcome: Progressing ?Goal: Diagnostic test results will improve ?Outcome: Progressing ?Goal: Respiratory complications will improve ?Outcome: Progressing ?Goal: Cardiovascular complication will be avoided ?Outcome: Progressing ?  ?

## 2022-05-03 NOTE — Progress Notes (Signed)
Physical Therapy Treatment Patient Details Name: Evan [name] MRN: [id number] DOB: [birth year] Today's Date: 05/03/2022   History of Present Illness 89 y.o. male admitted 1/24 with  Acute respiratory failure with hypoxia, found to have acute pulmonary embolism. 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 VSS. Near baseline per family. Min assist with boost to stand from EOB. Cues for technique with rising, pillows in chair to reduce pressure. Family in room supportive, plan to take pt home today. All questions answered. Patient will continue to benefit from skilled physical therapy services to further improve independence with functional mobility.    Recommendations for follow up therapy are one component of a multi-disciplinary discharge planning process, led by the attending physician.  Recommendations may be updated based on patient status, additional functional criteria and insurance authorization.  Follow Up Recommendations  Home health PT     Assistance Recommended at Discharge Frequent or constant Supervision/Assistance  Patient can return home with the following A little help with walking and/or transfers;A little help with bathing/dressing/bathroom;Assistance with cooking/housework;Assist for transportation;Help with stairs or ramp for entrance;Direct supervision/assist for medications management;Direct supervision/assist for financial management   Equipment Recommendations  None recommended by PT    Recommendations for Other Services       Precautions / Restrictions Precautions Precautions: Fall Precaution Comments: residual R hemiparesis Restrictions Weight  Bearing Restrictions: No     Mobility  Bed Mobility Overal bed mobility: Needs Assistance Bed Mobility: Supine to Sit     Supine to sit: Min assist     General bed mobility comments: Light min assist to rise to EOB and scoot. Pt able to pull through therapist hand and use trunk well. Cues for technique    Transfers Overall transfer level: Needs assistance Equipment used: Rolling walker (2 wheels) Transfers: Sit to/from Stand Sit to Stand: Min assist           General transfer comment: Min assist for boost to stand x2 from bed and x 1 from recliner. Less posterior lean today however still minimally present. Cues for technique and RLE placement.    Ambulation/Gait Ambulation/Gait assistance: Min assist Gait Distance (Feet): 40 Feet Assistive device: Rolling walker (2 wheels) Gait Pattern/deviations: Step-through pattern, Decreased stride length, Ataxic, Trunk flexed Gait velocity: decr Gait velocity interpretation: <1.31 ft/sec, indicative of household ambulator   General Gait Details: Min assist for RW control and intermittent posterior instability. RLE dysmetric with foot placement. Assisted to keep RUE on RW. No buckling noted. Cues for symmetry of steps and forward gaze.   Stairs             Wheelchair Mobility    Modified Rankin (Stroke Patients Only)       Balance Overall balance assessment: Needs assistance Sitting-balance support: Feet supported, No upper extremity supported Sitting balance-[name] Scale: Fair Sitting balance - Comments: can reach minimally outside BOS without LOB   Standing balance support: During functional activity, Single extremity supported Standing balance-[name] Scale: Poor Standing balance comment: reliant on external support                            Cognition Arousal/Alertness: Awake/alert Behavior  During Therapy: WFL for tasks assessed/performed Overall Cognitive Status: History of cognitive impairments -  at baseline                                          Exercises      General Comments General comments (skin integrity, edema, etc.): VSS, SpO2 95% on RA      Pertinent Vitals/Pain Pain Assessment Pain Assessment: No/denies pain    Home Living                          Prior Function            PT Goals (current goals can now be found in the care plan section) Acute Rehab PT Goals Patient Stated Goal: n/a PT Goal Formulation: With patient/family Time For Goal Achievement: 05/13/22 Potential to Achieve Goals: Good Progress towards PT goals: Progressing toward goals    Frequency    Min 3X/week      PT Plan Current plan remains appropriate    Co-evaluation              AM-PAC PT "6 Clicks" Mobility   Outcome Measure  Help needed turning from your back to your side while in a flat bed without using bedrails?: A Little Help needed moving from lying on your back to sitting on the side of a flat bed without using bedrails?: A Little Help needed moving to and from a bed to a chair (including a wheelchair)?: A Little Help needed standing up from a chair using your arms (e.g., wheelchair or bedside chair)?: A Little Help needed to walk in hospital room?: A Little Help needed climbing 3-5 steps with a railing? : A Lot 6 Click Score: 17    End of Session Equipment Utilized During Treatment: Gait belt Activity Tolerance: Patient tolerated treatment well Patient left: with family/visitor present;in chair;with call bell/phone within reach (several family members in room.) Nurse Communication: Mobility status (NTq) PT Visit Diagnosis: Unsteadiness on feet (R26.81);Other abnormalities of gait and mobility (R26.89);Muscle weakness (generalized) (M62.81);History of falling (Z91.81);Ataxic gait (R26.0);Difficulty in walking, not elsewhere classified (R26.2);Other symptoms and signs involving the nervous system (R29.898);Hemiplegia and  hemiparesis Hemiplegia - Right/Left: Right Hemiplegia - dominant/non-dominant: Dominant Hemiplegia - caused by:  (chronic)     Time: 9024-0973 PT Time Calculation (min) (ACUTE ONLY): 16 min  Charges:  $Gait Training: 8-22 mins                     Candie Mile, PT, DPT Physical Therapist [facility] [place]    Ellouise Newer 05/03/2022, 3:41 PM

## 2022-05-03 NOTE — Consult Note (Addendum)
WOC Nurse Consult Note: Consult requested for a Stage 1 pressure injury to sacrum, noted as present on admission in the wound care flow sheet, and maceration to buttocks.  These wounds can be treated independently by the bedside nurses using the Skin care order set in Epic. Zinc cream has already been ordered to protect the buttocks and repel moisture.  Please re-consult if further assistance is needed.  Thank-you,  Julien Girt MSN, [place], [place], [place], [place]

## 2022-05-03 NOTE — Discharge Summary (Signed)
Physician Discharge Summary  Evan [name] DGL:[id number] DOB: 1927-12-08 DOA: 04/27/2022  PCP: Evan Frizzle, MD  Admit date: 04/27/2022 Discharge date: 05/03/2022  Time spent: 40 minutes  Recommendations for Outpatient Follow-up:  Follow outpatient CBC/CMP  Follow pending MMA outpatient Follow pancytopenia outpatient - discharging with folate/vitamin b12 - received IV iron in hospital Follow volume status, lytes, creatinine on higher dose of lasix at discharge  Discharge Diagnoses:  Principal Problem:   Acute respiratory failure with hypoxia ([place]) Active Problems:   HFmrEF (heart failure with mildly reduced ejection fraction) ([place])   Acute pulmonary embolism (H[facility])   History of pulmonary embolism   Elevated troponin   Persistent atrial fibrillation (HCC)   BPH (benign prostatic hyperplasia)   Hypokalemia   Discharge Condition: stable  Diet recommendation: heart healthy  Filed Weights   04/29/22 0345 04/30/22 0420 05/02/22 0451  Weight: 67 kg 69 kg 68 kg    History of present illness:  Evan [name] is Evan [name] 89 y.o. male with extensive medical history significant of VD, CKD 3A, radiation pneumonitis, stroke (thalamic hemorrhage) with residual hemiparesis, CAD status post CABG, PE, atrial fibrillation, hyperlipidemia, anxiety, depression, hypertension, lung cancer, HFrEF, pericarditis, GERD, COPD, BPH, dementia, and tremor who presents with complaints of progressively worsening shortness of breath over the last week.  He was admitted with [name] heart failure exacerbation and acute pulmonary embolism.  Diuresed.  Heme c/s and plan for discharge on eliquis.  See below for additional details.   Hospital Course:  Assessment and Plan: Acute respiratory failure with hypoxia secondary to heart failure with reduced EF Acute on chronic.  Patient presented with complaints of worsening shortness of breath.  Found to be hypoxic down to 87% on room air with improvement on 2 L nasal cannula  oxygen.  CT angiogram of the chest noted concern for mild pulmonary edema with small bilateral pleural effusions and Evan [name] pulmonary embolism.  Right heart strain was noted but thought to be more so chronic in nature. BNP elevated at 683.2.  Last echocardiogram noted EF to be 45-50% with indeterminate diastolic parameters back in 04/2021.  Patient has been given Lasix 40 mg IV. -Strict I&O's and daily weights -CXR with mild edema, cardiomegaly -CXR with no active disease -start oral lasix 40 mg daily (this is twice his home dose, follow how he does on this).  Follow with cardiology outpatient. -Check echocardiogram - EF 50-55%, mildly reduced RVSF   Pulmonary embolism History of pulmonary embolism Acute.  Patient had Evan [name] prior history of pulmonary embolism and had been on Xarelto for anticoagulation.  Records note that he had previously failed Eliquis back in 08/2018 and has been recommended to be on lifelong enoxaparin.  Family reports compliance with Xarelto, but he was on 15mg  daily and not the 20 recommended for VTE treatment. -Heparin drip per pharmacy -appreciate hematology recs - Dr. [name] recommending eliquis 2.5 mg BID at discharge.  Complex situation overall, there's not [name] perfect solution.  Appreciate hematology's assistance.   Chills  Rigors Focal Consolidation within Posterior Basal RLL improving CT did show findings concerning for focal consolidation, will treat for possible pneumonia - could be pulm infarct Follow blood cultures  - NGTD   Elevated troponin Acute.  Patient had noted some complaints of chest pain.  High-sensitivity troponin was mildly elevated 16->19 but essentially flat.  EKG without significant ischemic changes.  Suspect secondary to demand in setting of above. -Continue to monitor   Persistent atrial fibrillation on chronic anticoagulation -  Goal potassium at least 4 and magnesium at least 2 -Anticoagulation as noted above   Anemia Iron def -> IV iron  ordered B12 low normal - follow MMA, will start b12 supplementation as well Follow folate    Essential hypertension Blood pressures currently maintained. -Held metoprolol due to low heart rates   CKD stage IIIb Creatinine 1.28 today (baseline appears to be 1.3-1.5)   History of thalamic hemorrhage Patient had been on therapeutic Lovenox when he developed Evan [name] left thalamic intraparenchymal hemorrhage in 2020   History of lung cancer Prior history of adenocarcinoma of the right lung status post radiation back in 2017   Mood disorder/depression -Continue Prozac   Hyperlipidemia -Continue Crestor   BPH -Continue finasteride and tamsulosin   Pruritus - has had pruritus of unclear cause while here.  No rashes seen. - ? Related to sheets.  Hydrocortisone, avoiding antihistamine to avoid sedation/confusion. - follow outpatient  Stage I pressure ulcer coccyx -barrier cream with zinc past Pressure Injury 04/28/22 Sacrum Mid Stage 1 -  Intact skin with non-blanchable redness of Evan [name] localized area usually over Evan [name] bony prominence. (Active)  04/28/22 2030  Location: Sacrum  Location Orientation: Mid  Staging: Stage 1 -  Intact skin with non-blanchable redness of Evan [name] localized area usually over Evan [name] bony prominence.  Wound Description (Comments):   Present on Admission: Yes      Procedures: LE [place] Summary:  BILATERAL:  - No evidence of deep vein thrombosis seen in the lower extremities,  bilaterally.  -No evidence of popliteal cyst, bilaterally.    Echo IMPRESSIONS     1. Assessing wall motion is challenging. Left ventricular ejection  fraction, by estimation, is 50 to 55%. The left ventricle has low normal  function.   2. Right ventricular systolic function is mildly reduced. The right  ventricular size is moderately enlarged. There is moderately elevated  pulmonary artery systolic pressure.   3. Left atrial size was moderately dilated.   4. Trivial mitral valve regurgitation.    5. Aortic valve regurgitation is not visualized.   6. The inferior vena cava is normal in size with <50% respiratory  variability, suggesting right atrial pressure of 8 mmHg.   Consultations: Hematology  Discharge Exam: Vitals:   05/03/22 0808 05/03/22 1200  BP: 112/75 (!) 117/53  Pulse: 75 63  Resp: 20   Temp: 98 F (36.7 C) 97.8 F (36.6 C)  SpO2: 93% 100%   C/o butt being sore No other complaints  General: No acute distress. Cardiovascular: Heart sounds show Sofija Antwi regular rate, and rhythm. No gallops or rubs. No murmurs. No JVD. Lungs: Clear to auscultation bilaterally with good air movement. No rales, rhonchi or wheezes. Abdomen: Soft, nontender, nondistended with normal active bowel sounds. No masses. No hepatosplenomegaly. Neurological: Alert and oriented 3. Moves all extremities 4. Cranial nerves II through XII grossly intact. Skin: sacrum examined, area of erythema covered by zinc paste, appears c/w masd? Though exam Armend Hochstatter bit limited by paste. Extremities: No clubbing or cyanosis. No edema.  Discharge Instructions   Discharge Instructions     (HEART FAILURE PATIENTS) Call MD:  Anytime you have any of the following symptoms: 1) 3 pound weight gain in 24 hours or 5 pounds in 1 week 2) shortness of breath, with or without Jourdon Zimmerle dry hacking cough 3) swelling in the hands, feet or stomach 4) if you have to sleep on extra pillows at night in order to breathe.   Complete by: As directed  Call MD for:  difficulty breathing, headache or visual disturbances   Complete by: As directed    Call MD for:  extreme fatigue   Complete by: As directed    Call MD for:  hives   Complete by: As directed    Call MD for:  persistant dizziness or light-headedness   Complete by: As directed    Call MD for:  persistant nausea and vomiting   Complete by: As directed    Call MD for:  redness, tenderness, or signs of infection (pain, swelling, redness, odor or green/yellow discharge around  incision site)   Complete by: As directed    Call MD for:  severe uncontrolled pain   Complete by: As directed    Call MD for:  temperature >100.4   Complete by: As directed    Diet - low sodium heart healthy   Complete by: As directed    Discharge instructions   Complete by: As directed    You were seen for Allessandra Bernardi heart failure exacerbation.  You also had Shulem Mader pulmonary embolism.  We treated you for heart failure.  We're going to increase your lasix when you leave.  Please follow up with your cardiologist outpatient to make further adjustments.  Check your weight daily.  If your weight increases by more than 3 lbs in Abbrielle Batts day or by 5 lbs in Declan Adamson week, call cardiology for instructions.  Follow with cardiology as an outpatient.  You have Nikitia Asbill blood clot.  This was Nickson Middlesworth complex issue since you were already on xarelto.  We had hematology see you and they recommended Rayvon Dakin transition to eliquis.  There's not Nikeisha Klutz perfect solution given your prior history of bleeding and now this new clot.  Follow with your PCP regarding this.  Follow with heme as needed.  We treated you with antibiotics for possible infection.  We'll give you another dose before you leave.  You have anemia (low blood counts).  We've started iron, vitamin b12, and folate.  Follow up with hematology as an outpatient.  You have Mekenna Finau lab (methylmalonic acid) which is pending at the time of discharge.  Follow this with hematology or your PCP.  You had problems with itching here.  I think this may have been Jordie Schreur reaction to the sheets.  We used hydrocortisone here.  I think this should improve when you get back home.  Return for new, recurrent, or worsening symptoms.  Please ask your PCP to request records from this hospitalization so they know what was done and what the next steps will be.   Discharge wound care:   Complete by: As directed    Continue barrier cream to your bottom   Increase activity slowly   Complete by: As directed       Allergies as of  05/03/2022       Reactions   Feldene [piroxicam] Rash   Blistering rash   Neomycin-polymyxin-hc Itching   Statins Other (See Comments)   Myalgias   Valium [diazepam] Other (See Comments)   Dizziness    Latex Rash   Tequin [gatifloxacin] Anxiety   Vibra-tab [doxycycline] Nausea Only        Medication List     STOP taking these medications    metoprolol tartrate 25 MG tablet Commonly known as: LOPRESSOR   nitroGLYCERIN 0.4 MG SL tablet Commonly known as: NITROSTAT   Xarelto 15 MG Tabs tablet Generic drug: Rivaroxaban       TAKE these medications  acetaminophen 500 MG tablet Commonly known as: TYLENOL Take 500 mg by mouth every 6 (six) hours as needed for mild pain, moderate pain, fever or headache.   apixaban 2.5 MG Tabs tablet Commonly known as: ELIQUIS Take 1 tablet (2.5 mg total) by mouth 2 (two) times daily.   cyanocobalamin 1000 MCG tablet Take 1 tablet (1,000 mcg total) by mouth daily. Start taking on: May 04, 2022   ferrous sulfate 325 (65 FE) MG tablet Take 1 tablet (325 mg total) by mouth every other day.   finasteride 5 MG tablet Commonly known as: PROSCAR Take 1 tablet (5 mg total) by mouth daily.   FLUoxetine 20 MG capsule Commonly known as: PROZAC TAKE 1 CAPSULE EVERY DAY What changed: when to take this   folic acid 1 MG tablet Commonly known as: FOLVITE Take 1 tablet (1 mg total) by mouth daily. Start taking on: May 04, 2022   furosemide 40 MG tablet Commonly known as: LASIX Take 1 tablet (40 mg total) by mouth daily. Start taking on: May 04, 2022 What changed:  medication strength how much to take   pantoprazole 40 MG tablet Commonly known as: PROTONIX Take 1 tablet (40 mg total) by mouth in the morning.   polyethylene glycol 17 g packet Commonly known as: MIRALAX / GLYCOLAX Take 17 g by mouth daily as needed for mild constipation.   rosuvastatin 5 MG tablet Commonly known as: CRESTOR TAKE 1 TABLET EVERY  DAY What changed: when to take this   tamsulosin 0.4 MG Caps capsule Commonly known as: FLOMAX TAKE 1 CAPSULE EVERY DAY What changed: when to take this   VITAMIN D-3 PO Take 1 capsule by mouth in the morning.   Xiidra 5 % Soln Generic drug: Lifitegrast Place 1 drop into both eyes 2 (two) times daily. What changed:  when to take this reasons to take this   [place] 1 application  topically 4 (four) times daily as needed (skin barrier). What changed: Another medication with the same name was added. Make sure you understand how and when to take each.   Zinc Oxide 12.8 % ointment Commonly known as: TRIPLE PASTE Apply topically 4 (four) times daily as needed (skin barrier). What changed: You were already taking Talise Sligh medication with the same name, and this prescription was added. Make sure you understand how and when to take each.               Discharge Care Instructions  (From admission, onward)           Start     Ordered   05/03/22 0000  Discharge wound care:       Comments: Continue barrier cream to your bottom   05/03/22 1423           Allergies  Allergen Reactions   Feldene [Piroxicam] Rash    Blistering rash   Neomycin-Polymyxin-Hc Itching   Statins Other (See Comments)    Myalgias   Valium [Diazepam] Other (See Comments)    Dizziness    Latex Rash   Tequin [Gatifloxacin] Anxiety   Vibra-Tab [Doxycycline] Nausea Only    Follow-up Information     [facility] Follow up.   Specialty: Home Health Services Why: Physical Therapy and Registered Nurse-office to call with vist times. Contact information: [address] [place] [address] [phone]         Evan Frizzle, MD Follow up.   Specialty: Family Medicine Contact  information: [place] [address] [phone]                  The results of significant diagnostics from this hospitalization (including imaging,  microbiology, ancillary and laboratory) are listed below for reference.    Significant Diagnostic Studies: DG CHEST PORT 1 VIEW  Result Date: 05/01/2022 CLINICAL DATA:  Shortness of breath and chest pain, initial encounter EXAM: PORTABLE CHEST 1 VIEW COMPARISON:  04/30/2022 FINDINGS: Cardiac shadow is stable. Postsurgical changes are again seen. The lungs are well aerated bilaterally. No focal infiltrate is seen. No bony abnormality is noted. IMPRESSION: No active disease. Electronically Signed   By: Inez Catalina M.D.   On: 05/01/2022 17:14   VAS [place] LOWER EXTREMITY VENOUS (DVT)  Result Date: 05/01/2022  Lower Venous DVT Study Patient Name:  Evan [name]  Date of Exam:   04/29/2022 Medical Rec #: [id number]     Accession #:    [id number] Date of Birth: [birth year]     Patient Gender: M Patient Age:   14 years Exam Location:  [facility] Procedure:      VAS [place] LOWER EXTREMITY VENOUS (DVT) Referring Phys: Rondell Frick POWELL JR --------------------------------------------------------------------------------  Indications: History of PE.  Comparison Study: 01/27/20 Negative LEV Performing Technologist: Velva Harman Sturdivant RDMS, RVT  Examination Guidelines: Reymond Maynez complete evaluation includes B-mode imaging, spectral Doppler, color Doppler, and power Doppler as needed of all accessible portions of each vessel. Bilateral testing is considered an integral part of Kiefer Opheim complete examination. Limited examinations for reoccurring indications may be performed as noted. The reflux portion of the exam is performed with the patient in reverse Trendelenburg.  +---------+---------------+---------+-----------+----------+--------------+ RIGHT    CompressibilityPhasicitySpontaneityPropertiesThrombus Aging +---------+---------------+---------+-----------+----------+--------------+ CFV      Full           Yes      Yes                                 +---------+---------------+---------+-----------+----------+--------------+ SFJ       Full                                                        +---------+---------------+---------+-----------+----------+--------------+ FV Prox  Full                                                        +---------+---------------+---------+-----------+----------+--------------+ FV Mid   Full                                                        +---------+---------------+---------+-----------+----------+--------------+ FV DistalFull                                                        +---------+---------------+---------+-----------+----------+--------------+ PFV  Full                                                        +---------+---------------+---------+-----------+----------+--------------+ POP      Full           Yes      Yes                                 +---------+---------------+---------+-----------+----------+--------------+ PTV      Full                                                        +---------+---------------+---------+-----------+----------+--------------+ PERO     Full                                                        +---------+---------------+---------+-----------+----------+--------------+   +---------+---------------+---------+-----------+----------+--------------+ LEFT     CompressibilityPhasicitySpontaneityPropertiesThrombus Aging +---------+---------------+---------+-----------+----------+--------------+ CFV      Full           Yes      Yes                                 +---------+---------------+---------+-----------+----------+--------------+ SFJ      Full                                                        +---------+---------------+---------+-----------+----------+--------------+ FV Prox  Full                                                        +---------+---------------+---------+-----------+----------+--------------+ FV Mid   Full                                                         +---------+---------------+---------+-----------+----------+--------------+ FV DistalFull                                                        +---------+---------------+---------+-----------+----------+--------------+ PFV      Full                                                        +---------+---------------+---------+-----------+----------+--------------+  POP      Full           Yes      Yes                                 +---------+---------------+---------+-----------+----------+--------------+ PTV      Full                                                        +---------+---------------+---------+-----------+----------+--------------+ PERO     Full                                                        +---------+---------------+---------+-----------+----------+--------------+     Summary: BILATERAL: - No evidence of deep vein thrombosis seen in the lower extremities, bilaterally. -No evidence of popliteal cyst, bilaterally.   *See table(s) above for measurements and observations. Electronically signed by Servando Snare MD on 05/01/2022 at 10:15:47 AM.    Final    DG CHEST PORT 1 VIEW  Result Date: 04/30/2022 CLINICAL DATA:  Shortness of breath EXAM: PORTABLE CHEST 1 VIEW COMPARISON:  Chest x-ray April 27, 2022 FINDINGS: Stable cardiomegaly. Sternotomy wires are intact. The hila and mediastinum is stable. No pneumothorax. Increased interstitial markings bilaterally have worsened mildly. No other interval changes or acute abnormalities. IMPRESSION: Findings are most consistent with cardiomegaly and mild pulmonary edema. Electronically Signed   By: Dorise Bullion III M.D.   On: 04/30/2022 12:57   ECHOCARDIOGRAM LIMITED  Result Date: 04/28/2022    ECHOCARDIOGRAM LIMITED REPORT   Patient Name:   Evan [name] Date of Exam: 04/28/2022 Medical Rec #:  [id number]    Height:       63.0 in Accession #:    [id number]   Weight:       170.0 lb Date of Birth:   1928-01-14    BSA:          1.805 m Patient Age:    31 years     BP:           124/59 mmHg Patient Gender: M            HR:           60 bpm. Exam Location:  Inpatient Procedure: Limited Echo, Cardiac Doppler and Limited Color Doppler Indications:    CHF-Acute Systolic I37.04  History:        Patient has prior history of Echocardiogram examinations, most                 recent 04/13/2021. CAD, Aortic Valve Disease, Arrythmias:Atrial                 Fibrillation, Signs/Symptoms:Shortness of Breath; Risk                 Factors:Non-Smoker, Hypertension and Dyslipidemia.  Sonographer:    Greer Pickerel Referring Phys: [id number] RONDELL Lakecia Deschamps SMITH  Sonographer Comments: Image acquisition challenging due to patient body habitus and Image acquisition challenging due to respiratory motion. IMPRESSIONS  1. Assessing wall motion is challenging. Left ventricular ejection fraction, by estimation, is 50 to 55%.  The left ventricle has low normal function.  2. Right ventricular systolic function is mildly reduced. The right ventricular size is moderately enlarged. There is moderately elevated pulmonary artery systolic pressure.  3. Left atrial size was moderately dilated.  4. Trivial mitral valve regurgitation.  5. Aortic valve regurgitation is not visualized.  6. The inferior vena cava is normal in size with <50% respiratory variability, suggesting right atrial pressure of 8 mmHg. FINDINGS  Left Ventricle: Assessing wall motion is challenging. Left ventricular ejection fraction, by estimation, is 50 to 55%. The left ventricle has low normal function. There is no left ventricular hypertrophy. Right Ventricle: The right ventricular size is moderately enlarged. Right ventricular systolic function is mildly reduced. There is moderately elevated pulmonary artery systolic pressure. The tricuspid regurgitant velocity is 2.93 m/s, and with an assumed right atrial pressure of 15 mmHg, the estimated right ventricular systolic pressure is 36.1  mmHg. Left Atrium: Left atrial size was moderately dilated. Right Atrium: Right atrial size was normal in size. Mitral Valve: Trivial mitral valve regurgitation. Tricuspid Valve: Tricuspid valve regurgitation is mild. Aortic Valve: Aortic valve regurgitation is not visualized. Pulmonic Valve: Pulmonic valve regurgitation is not visualized. Venous: The inferior vena cava is normal in size with less than 50% respiratory variability, suggesting right atrial pressure of 8 mmHg. Additional Comments: Spectral Doppler performed. Color Doppler performed.  LEFT VENTRICLE PLAX 2D LVIDd:         3.80 cm LVIDs:         3.00 cm LV PW:         1.00 cm LV IVS:        0.90 cm  MITRAL VALVE               TRICUSPID VALVE MV Area (PHT): 6.32 cm    TR Peak grad:   34.3 mmHg MV Decel Time: 120 msec    TR Vmax:        293.00 cm/s MV E velocity: 92.20 cm/s MV Sufyan Meidinger velocity: 37.80 cm/s MV E/Miquel Lamson ratio:  2.44 Landscape architect signed by Phineas Inches Signature Date/Time: 04/28/2022/5:35:41 PM    Final    CT Angio Chest PE W and/or Wo Contrast  Result Date: 04/28/2022 CLINICAL DATA:  Chest pain, dyspnea EXAM: CT ANGIOGRAPHY CHEST WITH CONTRAST TECHNIQUE: Multidetector CT imaging of the chest was performed using the standard protocol during bolus administration of intravenous contrast. Multiplanar CT image reconstructions and MIPs were obtained to evaluate the vascular anatomy. RADIATION DOSE REDUCTION: This exam was performed according to the departmental dose-optimization program which includes automated exposure control, adjustment of the mA and/or kV according to patient size and/or use of iterative reconstruction technique. CONTRAST:  35mL OMNIPAQUE IOHEXOL 350 MG/ML SOLN COMPARISON:  None Available. FINDINGS: Cardiovascular: There is adequate opacification of pulmonary arterial tree. There is Yahel Fuston single intraluminal filling defect identified within the posterior segmental pulmonary artery of the right lower lobe in keeping with an  acute pulmonary embolus. The overall embolic burden is tiny. The central pulmonary arteries are dilated in keeping with changes of pulmonary arterial hypertension. Coronary artery bypass grafting has been performed. Global cardiac size is mildly enlarged, stable since prior examination. There is mild relative dilation of the right ventricle and right atrium in keeping with elevated right heart pressures as well as prominent reflux of contrast to the hepatic venous system in keeping with some degree of right heart failure. The RV/LV ratio is elevated (RV/LV ratio equal 1.0), however, this appears stable since prior  examination and is likely chronic in nature. No pericardial effusion. Thoracic aorta is of normal caliber. Moderate atherosclerotic calcification. Mediastinum/Nodes: Shotty mediastinal adenopathy is nonspecific, possibly reactive in nature. No frankly pathologic thoracic adenopathy. Visualized thyroid unremarkable. Esophagus is unremarkable. Lungs/Pleura: Small bilateral pleural effusions are present. There is superimposed thickening of the peribronchovascular interstitium and scattered basilar and dependent predominant interstitial and ground-glass pulmonary infiltrates most suggestive of superimposed mild cardiogenic failure. More focal consolidation within the posterior basal right lower lobe may reflect superimposed pulmonary infarct given the previously identified pulmonary embolus. Upper Abdomen: No acute abnormality Musculoskeletal: No lytic or blastic bone lesion. Osseous structures are age-appropriate. Review of the MIP images confirms the above findings. IMPRESSION: 1. Small acute pulmonary embolus within the posterior segmental pulmonary artery of the right lower lobe. The overall embolic burden is tiny. While there is evidence right heart strain, this appears chronic in nature and is stable since prior examination. 2. Morphologic changes in keeping with pulmonary arterial hypertension and  elevated right heart pressures. Reflux of contrast into the hepatic venous system in keeping with at least some degree of right heart failure. 3. Mild interstitial and alveolar pulmonary edema and small bilateral pleural effusions in keeping with mild cardiogenic failure. 4. Superimposed focal consolidation within the posterior basal right lower lobe may reflect superimposed pulmonary infarct given the previously identified pulmonary embolus. These results were called by telephone at the time of interpretation on 04/28/2022 at 3:43 am to provider Ripley Fraise , who verbally acknowledged these results. Aortic Atherosclerosis (ICD10-I70.0). Electronically Signed   By: Fidela Salisbury M.D.   On: 04/28/2022 03:43   CT Head Wo Contrast  Result Date: 04/28/2022 CLINICAL DATA:  Evaluation for delirium. EXAM: CT HEAD WITHOUT CONTRAST TECHNIQUE: Contiguous axial images were obtained from the base of the skull through the vertex without intravenous contrast. RADIATION DOSE REDUCTION: This exam was performed according to the departmental dose-optimization program which includes automated exposure control, adjustment of the mA and/or kV according to patient size and/or use of iterative reconstruction technique. COMPARISON:  Prior CT from 03/12/2022. FINDINGS: Brain: Diffuse prominence of the CSF containing spaces compatible generalized cerebral atrophy. Moderately advanced chronic microvascular ischemic disease noted involving the supratentorial cerebral white matter. Few small remote lacunar infarcts about the bilateral basal ganglia/corona radiata. Small remote right cerebellar infarct. No acute intracranial hemorrhage. No acute large vessel territory infarct. No mass lesion, mass effect, or midline shift. No hydrocephalus or extra-axial fluid collection. Vascular: No abnormal hyperdense vessel. Calcified atherosclerosis present at skull base. Skull: Scalp soft tissues demonstrate no acute finding. Calvarium intact.  Sinuses/Orbits: Globes and orbital soft tissues within normal limits. Chronic left maxillary sinusitis noted. Prior right mastoidectomy. Small chronic appearing left mastoid effusion noted. Other: None. IMPRESSION: 1. No acute intracranial abnormality. 2. Generalized cerebral atrophy with moderately advanced chronic small vessel ischemic disease. 3. Chronic left maxillary sinusitis. Electronically Signed   By: Jeannine Boga M.D.   On: 04/28/2022 03:30   DG Chest 2 View  Result Date: 04/27/2022 CLINICAL DATA:  Chest pain.  Shortness of breath. EXAM: CHEST - 2 VIEW COMPARISON:  CXR 03/12/22 FINDINGS: No pleural effusion. No pneumothorax. No focal airspace opacity. Unchanged cardiac and mediastinal contours. Status post median sternotomy. No displaced rib fractures. Visualized upper abdomen is notable for gas distended loops of bowel and stomach. IMPRESSION: No focal airspace opacity. Electronically Signed   By: Marin Roberts M.D.   On: 04/27/2022 17:01    Microbiology: Recent Results (from the past 240  hour(s))  Resp panel by RT-PCR (RSV, Flu Laura-Lee Villegas&B, Covid) Anterior Nasal Swab     Status: None   Collection Time: 04/28/22  1:53 AM   Specimen: Anterior Nasal Swab  Result Value Ref Range Status   SARS Coronavirus 2 by RT PCR NEGATIVE NEGATIVE Final    Comment: (NOTE) SARS-CoV-2 target nucleic acids are NOT DETECTED.  The SARS-CoV-2 RNA is generally detectable in upper respiratory specimens during the acute phase of infection. The lowest concentration of SARS-CoV-2 viral copies this assay can detect is 138 copies/mL. Aly Hauser negative result does not preclude SARS-Cov-2 infection and should not be used as the sole basis for treatment or other patient management decisions. Denishia Citro negative result may occur with  improper specimen collection/handling, submission of specimen other than nasopharyngeal swab, presence of viral mutation(s) within the areas targeted by this assay, and inadequate number of  viral copies(<138 copies/mL). Taisa Deloria negative result must be combined with clinical observations, patient history, and epidemiological information. The expected result is Negative.  Fact Sheet for Patients:  EntrepreneurPulse.com.au  Fact Sheet for Healthcare Providers:  IncredibleEmployment.be  This test is no t yet approved or cleared by the [place] FDA and  has been authorized for detection and/or diagnosis of SARS-CoV-2 by FDA under an Emergency Use Authorization (EUA). This EUA will remain  in effect (meaning this test can be used) for the duration of the COVID-19 declaration under Section 564(b)(1) of the Act, 21 U.S.C.section 360bbb-3(b)(1), unless the authorization is terminated  or revoked sooner.       Influenza Isiaah Cuervo by PCR NEGATIVE NEGATIVE Final   Influenza B by PCR NEGATIVE NEGATIVE Final    Comment: (NOTE) The Xpert Xpress SARS-CoV-2/FLU/RSV plus assay is intended as an aid in the diagnosis of influenza from Nasopharyngeal swab specimens and should not be used as Railee Bonillas sole basis for treatment. Nasal washings and aspirates are unacceptable for Xpert Xpress SARS-CoV-2/FLU/RSV testing.  Fact Sheet for Patients: EntrepreneurPulse.com.au  Fact Sheet for Healthcare Providers: IncredibleEmployment.be  This test is not yet approved or cleared by the [place] FDA and has been authorized for detection and/or diagnosis of SARS-CoV-2 by FDA under an Emergency Use Authorization (EUA). This EUA will remain in effect (meaning this test can be used) for the duration of the COVID-19 declaration under Section 564(b)(1) of the Act, 21 U.S.C. section 360bbb-3(b)(1), unless the authorization is terminated or revoked.     Resp Syncytial Virus by PCR NEGATIVE NEGATIVE Final    Comment: (NOTE) Fact Sheet for Patients: EntrepreneurPulse.com.au  Fact Sheet for Healthcare  Providers: IncredibleEmployment.be  This test is not yet approved or cleared by the [place] FDA and has been authorized for detection and/or diagnosis of SARS-CoV-2 by FDA under an Emergency Use Authorization (EUA). This EUA will remain in effect (meaning this test can be used) for the duration of the COVID-19 declaration under Section 564(b)(1) of the Act, 21 U.S.C. section 360bbb-3(b)(1), unless the authorization is terminated or revoked.  Performed at [facility], [place] [address] [place], [place] [address]   Culture, blood (Routine X 2) w Reflex to ID Panel     Status: None (Preliminary result)   Collection Time: 04/29/22  3:03 PM   Specimen: BLOOD LEFT WRIST  Result Value Ref Range Status   Specimen Description BLOOD LEFT WRIST  Final   Special Requests   Final    BOTTLES DRAWN AEROBIC AND ANAEROBIC Blood Culture adequate volume   Culture   Final    NO GROWTH  4 DAYS Performed at [facility], [place] [address], [place], [place] [address]    Report Status PENDING  Incomplete  Culture, blood (Routine X 2) w Reflex to ID Panel     Status: None (Preliminary result)   Collection Time: 04/29/22  3:03 PM   Specimen: BLOOD RIGHT HAND  Result Value Ref Range Status   Specimen Description BLOOD RIGHT HAND  Final   Special Requests   Final    BOTTLES DRAWN AEROBIC AND ANAEROBIC Blood Culture results may not be optimal due to an inadequate volume of blood received in culture bottles   Culture   Final    NO GROWTH 4 DAYS Performed at [facility], [place] [address], [place], [place] [address]    Report Status PENDING  Incomplete     Labs: Basic Metabolic Panel: Recent Labs  Lab 04/29/22 0551 04/30/22 0129 05/01/22 0136 05/02/22 0211 05/03/22 0333  NA 134* 135 134* 133* 135  K 3.8 3.7 3.9 3.7 4.3  CL 105 102 103 102 103  CO2 21* 22 21* 21* 23  GLUCOSE 98 110* 111* 104* 93  BUN 23 25* 29* 26* 23  CREATININE 1.48* 1.79* 1.76* 1.41* 1.28*   CALCIUM 8.5* 8.6* 8.8* 8.9 8.8*  MG 2.0 2.0  --  2.0  --   PHOS  --  4.7*  --  3.1  --    Liver Function Tests: Recent Labs  Lab 04/30/22 0129 05/02/22 0211  AST 16 17  ALT 8 9  ALKPHOS 86 84  BILITOT 0.6 0.7  PROT 6.4* 6.5  ALBUMIN 3.1* 3.2*   No results for input(s): "LIPASE", "AMYLASE" in the last 168 hours. No results for input(s): "AMMONIA" in the last 168 hours. CBC: Recent Labs  Lab 04/27/22 1643 04/29/22 0710 04/29/22 1501 04/30/22 0129 05/01/22 0141 05/02/22 0211 05/03/22 0333  WBC 5.8 5.2  --  5.0 5.8 5.6 5.9  NEUTROABS 3.6  --   --   --   --   --   --   HGB 8.6* 7.7* 7.8* 7.7* 8.2* 8.3* 8.0*  HCT 26.7* 23.8* 23.9* 23.8* 25.0* 25.9* 25.8*  MCV 95.0 91.5  --  91.9 91.6 92.8 94.2  PLT 115* 104*  --  87* 97* 93* 95*   Cardiac Enzymes: No results for input(s): "CKTOTAL", "CKMB", "CKMBINDEX", "TROPONINI" in the last 168 hours. BNP: BNP (last 3 results) Recent Labs    04/27/22 1643 05/01/22 1221  BNP 683.2* 176.7*    ProBNP (last 3 results) Recent Labs    06/25/21 0911  PROBNP 3,088*    CBG: No results for input(s): "GLUCAP" in the last 168 hours.     Signed:  Fayrene Helper MD.  [facility] 05/03/2022, 2:49 PM

## 2022-05-03 NOTE — Progress Notes (Signed)
Patient needed medication administration prior to discharging. RN was waiting on the medication and it just arrived. Patient's nurse is currently administering the medication. Patient should discharge soon.   Gomez Cleverly  SWOT RN

## 2022-05-04 ENCOUNTER — Other Ambulatory Visit: Payer: Self-pay

## 2022-05-04 ENCOUNTER — Encounter: Payer: Self-pay | Admitting: *Deleted

## 2022-05-04 ENCOUNTER — Telehealth: Payer: Self-pay | Admitting: *Deleted

## 2022-05-04 ENCOUNTER — Other Ambulatory Visit (HOSPITAL_COMMUNITY): Payer: Self-pay

## 2022-05-04 DIAGNOSIS — I2510 Atherosclerotic heart disease of native coronary artery without angina pectoris: Secondary | ICD-10-CM

## 2022-05-04 DIAGNOSIS — I69351 Hemiplegia and hemiparesis following cerebral infarction affecting right dominant side: Secondary | ICD-10-CM

## 2022-05-04 DIAGNOSIS — R531 Weakness: Secondary | ICD-10-CM

## 2022-05-04 DIAGNOSIS — I739 Peripheral vascular disease, unspecified: Secondary | ICD-10-CM

## 2022-05-04 DIAGNOSIS — D72829 Elevated white blood cell count, unspecified: Secondary | ICD-10-CM

## 2022-05-04 DIAGNOSIS — N183 Chronic kidney disease, stage 3 unspecified: Secondary | ICD-10-CM

## 2022-05-04 DIAGNOSIS — J438 Other emphysema: Secondary | ICD-10-CM

## 2022-05-04 LAB — CULTURE, BLOOD (ROUTINE X 2)
Culture: NO GROWTH
Culture: NO GROWTH
Special Requests: ADEQUATE

## 2022-05-04 NOTE — Patient Outreach (Signed)
  Care Coordination [facility] Note Transition Care Management Follow-up Telephone Call Date of discharge and from where: [facility] on 05/03/22 How have you been since you were released from the hospital? Per daughter, [name], "Feeling pretty good. Stronger than he was." Any questions or concerns? Yes. Went into the hospital without any skin breakdown. Developed open bleeding wounds on both butt cheeks prior to discharge. RNCC placed a Safety Zone Portal ticket.   Items Reviewed: Did the pt receive and understand the discharge instructions provided? Yes  Medications obtained and verified? Yes . Eliquis 2.5mg  bid. Educated on taking this twice a day. Xarelto was once a day so they didn't realize that eliquis is twice a day. Has not received triple paste that was sent to [facility]. Outreach to [employer]. Script was cancelled b/c it is OTC. Patient/family wasn't notified and they have been expecting a prescription. RNCC will advise to use OTC triple paste. Entered this into safety zone portal ticket as well.  Other? Yes . Will need blood work at PCP appt. Discussed wound care. Was not discharge with oxygen. Using home walker/wheelchair for ambulation.  Any new allergies since your discharge? No  Dietary orders reviewed? Yes Do you have support at home? Yes . daughters  Home Care and Equipment/Supplies: Were home health services ordered? yes If so, what is the name of the agency? [facility]  Has the agency set up a time to come to the patient's home? Yes. Nursing is unable to come out until 05/10/22. [place] to see if she can do a home visit between now and then to assess wound status.  Were any new equipment or medical supplies ordered?  No.has walker and wheelchair at home.  What is the name of the medical supply agency?  Were you able to get the supplies/equipment? not applicable Do you have any questions related to the use of the equipment or supplies?  No  Functional Questionnaire: (I = Independent and D = Dependent) ADLs: D  Bathing/Dressing- D  Meal Prep- D  Eating- I  Maintaining continence- D  Transferring/Ambulation- D  Managing Meds- D  Follow up appointments reviewed:  PCP Hospital f/u appt confirmed? Yes  Scheduled to see Dr Dennard Schaumann 05/10/22 at [facility] f/u appt confirmed?  N/A   Are transportation arrangements needed? No  If their condition worsens, is the pt aware to call PCP or go to the Emergency Dept.? Yes Was the patient provided with contact information for the PCP's office or ED? Yes Was to pt encouraged to call back with questions or concerns? Yes  SDOH assessments and interventions completed:   Yes SDOH Interventions Today    Flowsheet Row Most Recent Value  SDOH Interventions   Housing Interventions Intervention Not Indicated  Transportation Interventions Intervention Not Indicated  Financial Strain Interventions Intervention Not Indicated       Care Coordination Interventions:  PCP follow up appointment requested Referral to Deloria Lair, [place] to [facility] regarding triple paste prescription Safety zone portal ticket entered regarding wounds and medication cancellation without notifying patient/family that they can purchase OTC  Encounter Outcome:  Pt. Visit Completed    Chong Sicilian, BSN, RN-BC RN Care Coordinator [place] Direct Dial: [phone] Main #: [phone]

## 2022-05-06 ENCOUNTER — Other Ambulatory Visit: Payer: Self-pay | Admitting: Physician Assistant

## 2022-05-06 ENCOUNTER — Encounter: Payer: Self-pay | Admitting: *Deleted

## 2022-05-06 ENCOUNTER — Ambulatory Visit: Payer: Medicare HMO | Admitting: Pharmacist

## 2022-05-06 ENCOUNTER — Telehealth: Payer: Self-pay | Admitting: *Deleted

## 2022-05-06 NOTE — Progress Notes (Signed)
Care Management & Coordination Services Pharmacy Note  05/06/2022 Name:  Evan [name] MRN:  [id number] DOB:  April 28, 1927  Summary: Pharmd FU visit.  Patient with recent hospital stay due to HF and acute PE.  Switched to Eliquis now taking 2.5mg  BID.  Also increased Lasix to 40mg .  Family is monitoring weights and SOB.  They will call me with rapid changed in weight or symptoms.  Recommendations/Changes made from today's visit: No changes to meds  Follow up plan: Fu 3 months on phone   Subjective: Evan [name] is an 89 y.o. year old male who is a primary patient of Pickard, Cammie Mcgee, MD.  The care coordination team was consulted for assistance with disease management and care coordination needs.    Engaged with patient by telephone for follow up visit.  Recent office visits:  11/25/21 [name]. MD - Family Medicine - Intertrigo - fluconazole (DIFLUCAN) 150 MG tablet prescribed. Recheck in 4 weeks.    Recent consult visits:  11/01/21 Larey Days, MD - Pulmonology - Chronic cough - Swallow study ordered. Follow up as scheduled.    11/01/21 Larae Grooms, MD - Cardiology - A Fib - Labs were ordered. furosemide (LASIX) 20 MG tablet ordered.    Hospital visits:  None in previous 6 months   Objective:  Lab Results  Component Value Date   CREATININE 1.28 (H) 05/03/2022   BUN 23 05/03/2022   GFR 56.40 (L) 06/21/2017   EGFR 53 (L) 03/25/2022   GFRNONAA 53 (L) 05/03/2022   GFRAA 54 (L) 06/22/2020   NA 135 05/03/2022   K 4.3 05/03/2022   CALCIUM 8.8 (L) 05/03/2022   CO2 23 05/03/2022   GLUCOSE 93 05/03/2022    Lab Results  Component Value Date/Time   HGBA1C 5.3 12/02/2016 04:32 PM   GFR 56.40 (L) 06/21/2017 04:17 PM   GFR 63.82 06/14/2017 10:10 AM    Last diabetic Eye exam: No results found for: "HMDIABEYEEXA"  Last diabetic Foot exam: No results found for: "HMDIABFOOTEX"   Lab Results  Component Value Date   CHOL 91 (L) 06/22/2020   HDL 35 (L) 06/22/2020    LDLCALC 41 06/22/2020   TRIG 66 06/22/2020   CHOLHDL 2.6 06/22/2020       Latest Ref Rng & Units 05/02/2022    2:11 AM 04/30/2022    1:29 AM 03/13/2022   12:31 AM  Hepatic Function  Total Protein 6.5 - 8.1 g/dL 6.5  6.4  5.3   Albumin 3.5 - 5.0 g/dL 3.2  3.1  2.5   AST 15 - 41 U/L 17  16  15    ALT 0 - 44 U/L 9  8  8    Alk Phosphatase 38 - 126 U/L 84  86  63   Total Bilirubin 0.3 - 1.2 mg/dL 0.7  0.6  0.9     Lab Results  Component Value Date/Time   TSH 2.850 06/22/2020 09:53 AM   TSH 1.378 08/23/2018 02:36 AM   TSH 1.61 01/17/2017 11:15 AM   FREET4 0.76 12/20/2016 04:51 PM       Latest Ref Rng & Units 05/03/2022    3:33 AM 05/02/2022    2:11 AM 05/01/2022    1:41 AM  CBC  WBC 4.0 - 10.5 K/uL 5.9  5.6  5.8   Hemoglobin 13.0 - 17.0 g/dL 8.0  8.3  8.2   Hematocrit 39.0 - 52.0 % 25.8  25.9  25.0   Platelets 150 - 400 K/uL 95  93  97     Lab Results  Component Value Date/Time   VD25OH 27 (L) 09/21/2015 09:40 AM   VITAMINB12 264 05/02/2022 02:11 AM   VITAMINB12 509 08/23/2018 02:36 AM    Clinical ASCVD: Yes  The ASCVD Risk score (Arnett DK, et al., 2019) failed to calculate for the following reasons:   The 2019 ASCVD risk score is only valid for ages 87 to 41   The patient has a prior MI or stroke diagnosis        03/25/2022    3:55 PM 02/10/2022   12:02 PM 01/05/2022    2:13 PM  Depression screen PHQ 2/9  Decreased Interest 0 1 1  Down, Depressed, Hopeless 0 0 1  PHQ - 2 Score 0 1 2  Altered sleeping   3  Tired, decreased energy   3  Change in appetite   1  Feeling bad or failure about yourself    0  Trouble concentrating   1  Moving slowly or fidgety/restless   1  Suicidal thoughts   1  PHQ-9 Score   12  Difficult doing work/chores   Somewhat difficult     Social History   Tobacco Use  Smoking Status Never   Passive exposure: Yes  Smokeless Tobacco Former   Types: Chew  Tobacco Comments   "quit chewing in the 1960s"   BP Readings from Last 3  Encounters:  05/03/22 (!) 117/53  04/11/22 124/72  03/25/22 120/76   Pulse Readings from Last 3 Encounters:  05/03/22 63  04/11/22 (!) 57  03/25/22 65   Wt Readings from Last 3 Encounters:  05/02/22 149 lb 14.6 oz (68 kg)  04/11/22 157 lb (71.2 kg)  03/25/22 157 lb 6.4 oz (71.4 kg)   BMI Readings from Last 3 Encounters:  05/02/22 26.56 kg/m  04/11/22 27.81 kg/m  03/25/22 27.88 kg/m    Allergies  Allergen Reactions   Feldene [Piroxicam] Rash    Blistering rash   Neomycin-Polymyxin-Hc Itching   Statins Other (See Comments)    Myalgias   Valium [Diazepam] Other (See Comments)    Dizziness    Latex Rash   Tequin [Gatifloxacin] Anxiety   Vibra-Tab [Doxycycline] Nausea Only    Medications Reviewed Today     Reviewed by Knox Royalty, RN (Registered Nurse) on 05/06/22 at [facility] List Status: <None>   Medication Order Taking? Sig Documenting Provider Last Dose Status Informant  acetaminophen (TYLENOL) 500 MG tablet [id number] No Take 500 mg by mouth every 6 (six) hours as needed for mild pain, moderate pain, fever or headache. [provider] 04/27/2022 Active Pharmacy Records, Child  apixaban (ELIQUIS) 2.5 MG TABS tablet [id number]  Take 1 tablet (2.5 mg total) by mouth 2 (two) times daily. Elodia Florence., MD  Active   Cholecalciferol (VITAMIN D-3 PO) [id number] No Take 1 capsule by mouth in the morning. [provider] 04/27/2022 Active Pharmacy Records, Child  cyanocobalamin 1000 MCG tablet [id number]  Take 1 tablet (1,000 mcg total) by mouth daily. Elodia Florence., MD  Active   ferrous sulfate 325 (65 FE) MG tablet [id number]  Take 1 tablet (325 mg total) by mouth every other day. Elodia Florence., MD  Active   finasteride (PROSCAR) 5 MG tablet [id number] No Take 1 tablet (5 mg total) by mouth daily. [name], MD 04/27/2022 Active Pharmacy Records, Child  FLUoxetine (PROZAC) 20 MG capsule [id number] No TAKE 1 CAPSULE EVERY DAY  Patient taking differently: Take 20 mg by mouth in the morning.   [name], MD 04/27/2022 Active Pharmacy Records, Child  folic acid (FOLVITE) 1 MG tablet [id number]  Take 1 tablet (1 mg total) by mouth daily. Elodia Florence., MD  Active   furosemide (LASIX) 40 MG tablet [id number]  Take 1 tablet (40 mg total) by mouth daily. Elodia Florence., MD  Active   Lifitegrast Shirley Friar) 5 % SOLN [id number] No Place 1 drop into both eyes 2 (two) times daily.  Patient taking differently: Place 1 drop into both eyes 2 (two) times daily as needed (dry eyes).    Past Month Active Pharmacy Records, Child  pantoprazole (PROTONIX) 40 MG tablet [id number] No Take 1 tablet (40 mg total) by mouth in the morning. [name], MD 04/27/2022 Active Pharmacy Records, Child  polyethylene glycol (MIRALAX / GLYCOLAX) 17 g packet [id number] No Take 17 g by mouth daily as needed for mild constipation. Cathlyn Parsons, PA-C Past Month Active Pharmacy Records, Child  rosuvastatin (CRESTOR) 5 MG tablet [id number] No TAKE 1 TABLET EVERY DAY  Patient taking differently: Take 5 mg by mouth daily with supper.   Sharmon Revere 04/26/2022 Active Pharmacy Records, Child  tamsulosin (FLOMAX) 0.4 MG CAPS capsule [id number] No TAKE 1 CAPSULE EVERY DAY  Patient taking differently: Take 0.4 mg by mouth daily with supper.   [name], MD 04/26/2022 Active Pharmacy Records, Child  Zinc Oxide (TRIPLE PASTE) 12.8 % ointment [id number]  Apply topically 4 (four) times daily as needed (skin barrier). Elodia Florence., MD  Active   ZINC OXIDE [place] [id number] No Apply 1 application  topically 4 (four) times daily as needed (skin barrier). [provider] Past Month Active Child, Pharmacy Records            SDOH:  (Social Determinants of Health) assessments and interventions performed: No Financial Resource Strain: Low Risk  (05/04/2022)   Overall Financial Resource Strain (CARDIA)    Difficulty  of Paying Living Expenses: Not hard at all    SDOH Interventions    Flowsheet Row Telephone from 05/04/2022 in [place] from 05/07/2021 in [place] Office Visit from 07/04/2019 in [place] Interventions     Food Insecurity Interventions -- Intervention Not Indicated --  Housing Interventions Intervention Not Indicated Intervention Not Indicated --  Transportation Interventions Intervention Not Indicated Intervention Not Indicated --  Depression Interventions/Treatment  -- -- Medication  Financial Strain Interventions Intervention Not Indicated Intervention Not Indicated --  Physical Activity Interventions -- Other (Comments)  [Pt does chair exercises and stretching.] --  Stress Interventions -- Intervention Not Indicated --  Social Connections Interventions -- Other (Comment)  [Pt sees family daily.] --       Medication Assistance: None required.  Patient affirms current coverage meets needs.  Medication Access: Within the past 30 days, how often has patient missed a dose of medication? 0 Is a pillbox or other method used to improve adherence? Yes  Factors that may affect medication adherence? no barriers identified Are meds synced by current pharmacy? No  Are meds delivered by current pharmacy? No  Does patient experience delays in picking up medications due to transportation concerns? No   [facility] Reviewed: Is patient disadvantaged to use [facility]?: Yes  Current Rx insurance plan: [employer] Name and location of Current pharmacy: No Pharmacies Listed [facility]  services reviewed with patient today?: Yes  Patient requests to transfer care to [facility]?: No  Reason patient declined to change pharmacies: Loyalty to other pharmacy/Patient preference  Compliance/Adherence/Medication fill history: Care Gaps: N/A  Star-Rating  Drugs: Rosuvastatin 5mg  01/10/22 - reports supply on hand   Assessment/Plan     Hypertension (BP goal <130/80) -Controlled -Current treatment: Metoprolol 25mg  one-half tablet once daily -Medications previously tried: atenolol  -Current home readings: 110/58 40-70s -Current exercise habits: minimal -Denies hypotensive/hypertensive symptoms -Educated on BP goals and benefits of medications for prevention of heart attack, stroke and kidney damage; Daily salt intake goal < 2300 mg; Importance of home blood pressure monitoring; Symptoms of hypotension and importance of maintaining adequate hydration; -Counseled to monitor BP at home weekly, document, and provide log at future appointments Home BP reported as controlled. -Recommended to continue current medication Continue to monitor at home  Hyperlipidemia: (LDL goal < 70 -Controlled -Current treatment: Rosuvastatin 5mg  daily - PM -Medications previously tried: none noted  -Current exercise habits: minimal -Educated on Cholesterol goals;  Benefits of statin for ASCVD risk reduction; Importance of limiting foods high in cholesterol; -Most recent LDL is normal -Recommended to continue current medication  Atrial Fibrillation (Goal: prevent stroke and major bleeding) 05/06/22 -Controlled -Current treatment: Rate control: Metoprolol tartrate 25mg  one half tablet once daily  Appropriate, Effective, Safe, Accessible Anticoagulation: Eliquis 2.5mg  BID  Appropriate, Effective, Safe, Accessible -Medications previously tried: none noted -Home BP and HR readings: "controlled'  -Counseled on increased risk of stroke due to Afib and benefits of anticoagulation for stroke prevention; bleeding risk associated with Xarelto and importance of self-monitoring for signs/symptoms of bleeding; -Had acute PE while on Xarelto.  Switched to Eliquis after hospital stay.  He is tolerating this well.  Copay is affordable and they are aware to take twice  daily.  He is borderline for being able to take the 5mg  dose - will defer to cardiology on the dosing due to age.  Update 09/03/21 Does mention Xarelto is costing them over $100 per month.  Possible they may could qualify for the [facility] program where the copay would be max $85 per month. Will assess finances and try to help them get help with copay. NO other concerns at this time, no abnormal bleeding. Continue current meds.  Heart Failure (Goal: manage symptoms and prevent exacerbations) 05/06/22 -Controlled -Last ejection fraction: 60-65%  -HF type: Diastolic -Current treatment: Furosemide 40mg  daily Appropriate, Effective, Safe, Accessible -Medications previously tried: metoprolol -Denies any recent swelling that does not resolve -Educated on Benefits of medications for managing symptoms and prolonging life Importance of weighing daily; if you gain more than 3 pounds in one day or 5 pounds in one week, contact providers -Furosemide dose increased.  Counseled on signs of HF exacerbation.  Family is now weighing him daily and knows to call me with rapid weight increase, SOB, etc. -He has improved since discharge per family.   Update 09/02/21 Continuing to monitor weights, swelling. Denies any swelling at this time. She was giving him 40mg  of Lasix but had to reduce to 20mg  due to some dehydration. No other concerns at this time, continue to monitor fluid retention, limit salt intake. No med changes.  COPD (Goal: control symptoms and prevent exacerbations) -Not ideally controlled -Current treatment  None currently -Medications previously tried: Ventolin, Advair  -Pulmonary function testing: Pulmonary Functions Testing Results:  TLC  Date Value Ref Range Status  01/05/2016 5.47 L Final  -Exacerbations requiring treatment in last 6  months: no -Patient denies consistent use of maintenance inhaler -Frequency of rescue inhaler use: none -Counseled on Benefits of consistent  maintenance inhaler use Differences between maintenance and rescue inhalers Patient may benefit from maintenance inhaler if SOB does not improve. -Continue current management for now.  Depression/Anxiety (Goal: Reduce Symptoms) -Controlled -Current treatment: Fluoxetine 20mg  daily Alprazolam 0.5mg  one-half tid prn - not taking at this time -Medications previously tried/failed: Abilify, lorazepam, Effexor -PHQ9:  PHQ9 SCORE ONLY 07/06/2020 07/04/2019 08/20/2018  PHQ-9 Total Score 0 2 8  -Educated on Benefits of medication for symptom control -Does sometimes have difficulty sleeping, does not like Xanax because it makes him drowsy the whole next day -Recommended to continue current medication Consider something like trazodone if sleep continues to be an issue.  Patient Goals/Self-Care Activities Patient will:  - take medications as prescribed as evidenced by patient report and record review focus on medication adherence by pill box check blood pressure weekly, document, and provide at future appointments  Follow Up Plan: The care management team will reach out to the patient again over the next 180 days.          Beverly Milch, PharmD, CPP Clinical Pharmacist Practitioner [facility] [phone]

## 2022-05-06 NOTE — Patient Outreach (Signed)
  Care Coordination   Post-TOC Care Coordination Telephone  Visit Note   05/06/2022 Name: LONALD TROIANI MRN: [id number] DOB: 06/05/1927  DEJAUN VIDRIO is a 89 y.o. year old male who sees Pickard, Cammie Mcgee, MD for primary care. I spoke with home health agency [facility] [phone] Seth Bake) re: [name] by phone today.  Seth Bake confirmed that home health services have been initiated/ resumed; confirms RN made first home visit with patient yesterday 05/05/22 and that services will be ongoing  SDOH assessments and interventions completed:  No  Interventions Today    Flowsheet Row Most Recent Value  Chronic Disease Discussed/Reviewed   Chronic disease discussed/reviewed during today's visit Chronic Obstructive Pulmonary Disease (COPD)  [Contacted [facility] agency and confirmed services have been initiated: home health RN visit occurred yesterday 05/05/22,  confirmed services will be ongoing]        Care Coordination Interventions:  Yes, provided   Follow up plan: Follow up call scheduled for 05/11/22 with RN CM Care Coordinator    Encounter Outcome:  Pt. Visit Completed   Oneta Rack, RN, BSN, CCRN Alumnus RN CM Care Coordination/ Transition of [place] Management [phone]: direct office

## 2022-05-09 ENCOUNTER — Telehealth: Payer: Self-pay

## 2022-05-09 NOTE — Telephone Encounter (Signed)
Pt has hospital f/u scheduled for 05/10/22. Pt's daughter, [name], called and states pt is having a non productive cough and chest congestion. HR is 90 but O2 is between 91-96. No fever. [name] states the hospital took him off of his Metoprolol, which usually keeps his heart rate down. Do you feel like he needs to be seen somewhere sooner than tomorrow? Thanks.

## 2022-05-10 ENCOUNTER — Ambulatory Visit (INDEPENDENT_AMBULATORY_CARE_PROVIDER_SITE_OTHER): Payer: Medicare HMO | Admitting: Family Medicine

## 2022-05-10 ENCOUNTER — Other Ambulatory Visit (HOSPITAL_COMMUNITY): Payer: Self-pay

## 2022-05-10 ENCOUNTER — Encounter: Payer: Self-pay | Admitting: Family Medicine

## 2022-05-10 VITALS — BP 116/72 | HR 80 | Temp 98.8°F | Ht 63.0 in | Wt 152.0 lb

## 2022-05-10 DIAGNOSIS — I5021 Acute systolic (congestive) heart failure: Secondary | ICD-10-CM

## 2022-05-10 DIAGNOSIS — N183 Chronic kidney disease, stage 3 unspecified: Secondary | ICD-10-CM | POA: Diagnosis not present

## 2022-05-10 DIAGNOSIS — I2699 Other pulmonary embolism without acute cor pulmonale: Secondary | ICD-10-CM | POA: Diagnosis not present

## 2022-05-10 MED ORDER — AMOXICILLIN-POT CLAVULANATE 875-125 MG PO TABS
1.0000 | ORAL_TABLET | Freq: Two times a day (BID) | ORAL | 0 refills | Status: DC
  Filled 2022-05-10: qty 20, 10d supply, fill #0

## 2022-05-10 NOTE — Progress Notes (Signed)
Subjective:   Admit date: 04/27/2022 Discharge date: 05/03/2022   Time spent: 40 minutes   Recommendations for Outpatient Follow-up:  Follow outpatient CBC/CMP  Follow pending MMA outpatient Follow pancytopenia outpatient - discharging with folate/vitamin b12 - received IV iron in hospital Follow volume status, lytes, creatinine on higher dose of lasix at discharge   Discharge Diagnoses:  Principal Problem:   Acute respiratory failure with hypoxia ([place]) Active Problems:   HFmrEF (heart failure with mildly reduced ejection fraction) ([place])   Acute pulmonary embolism ([place])   History of pulmonary embolism   Elevated troponin   Persistent atrial fibrillation (HCC)   BPH (benign prostatic hyperplasia)   Hypokalemia     Discharge Condition: stable   Diet recommendation: heart healthy        Filed Weights    04/29/22 0345 04/30/22 0420 05/02/22 0451  Weight: 67 kg 69 kg 68 kg      History of present illness:  Evan [name] is a 89 y.o. male with extensive medical history significant of VD, CKD 3A, radiation pneumonitis, stroke (thalamic hemorrhage) with residual hemiparesis, CAD status post CABG, PE, atrial fibrillation, hyperlipidemia, anxiety, depression, hypertension, lung cancer, HFrEF, pericarditis, GERD, COPD, BPH, dementia, and tremor who presents with complaints of progressively worsening shortness of breath over the last week.   He was admitted with a heart failure exacerbation and acute pulmonary embolism.   Diuresed.  Heme c/s and plan for discharge on eliquis.  See below for additional details.    Hospital Course:  Assessment and Plan: Acute respiratory failure with hypoxia secondary to heart failure with reduced EF Acute on chronic.  Patient presented with complaints of worsening shortness of breath.  Found to be hypoxic down to 87% on room air with improvement on 2 L nasal cannula oxygen.  CT angiogram of the chest noted concern for mild pulmonary edema with small  bilateral pleural effusions and a pulmonary embolism.  Right heart strain was noted but thought to be more so chronic in nature. BNP elevated at 683.2.  Last echocardiogram noted EF to be 45-50% with indeterminate diastolic parameters back in 04/2021.  Patient has been given Lasix 40 mg IV. -Strict I&O's and daily weights -CXR with mild edema, cardiomegaly -CXR with no active disease -start oral lasix 40 mg daily (this is twice his home dose, follow how he does on this).  Follow with cardiology outpatient. -Check echocardiogram - EF 50-55%, mildly reduced RVSF   Pulmonary embolism History of pulmonary embolism Acute.  Patient had a prior history of pulmonary embolism and had been on Xarelto for anticoagulation.  Records note that he had previously failed Eliquis back in 08/2018 and has been recommended to be on lifelong enoxaparin.  Family reports compliance with Xarelto, but he was on 15mg  daily and not the 20 recommended for VTE treatment. -Heparin drip per pharmacy -appreciate hematology recs - Dr. [name] recommending eliquis 2.5 mg BID at discharge.  Complex situation overall, there's not a perfect solution.  Appreciate hematology's assistance.   Chills  Rigors Focal Consolidation within Posterior Basal RLL improving CT did show findings concerning for focal consolidation, will treat for possible pneumonia - could be pulm infarct Follow blood cultures  - NGTD   Elevated troponin Acute.  Patient had noted some complaints of chest pain.  High-sensitivity troponin was mildly elevated 16->19 but essentially flat.  EKG without significant ischemic changes.  Suspect secondary to demand in setting of above. -Continue to monitor   Persistent atrial fibrillation on  chronic anticoagulation -Goal potassium at least 4 and magnesium at least 2 -Anticoagulation as noted above   Anemia Iron def -> IV iron ordered B12 low normal - follow MMA, will start b12 supplementation as well Follow folate     Essential hypertension Blood pressures currently maintained. -Held metoprolol due to low heart rates   CKD stage IIIb Creatinine 1.28 today (baseline appears to be 1.3-1.5)   History of thalamic hemorrhage Patient had been on therapeutic Lovenox when he developed a left thalamic intraparenchymal hemorrhage in 2020   History of lung cancer Prior history of adenocarcinoma of the right lung status post radiation back in 2017   Mood disorder/depression -Continue Prozac   Hyperlipidemia -Continue Crestor   BPH -Continue finasteride and tamsulosin   Pruritus - has had pruritus of unclear cause while here.  No rashes seen. - ? Related to sheets.  Hydrocortisone, avoiding antihistamine to avoid sedation/confusion. - follow outpatient  05/10/22 Patient was recently admitted with an acute pulmonary embolism in his right lung as well as right ventricular strain, pulmonary edema, and acute congestive heart failure.  He was discharged home on Lasix 40 mg a day.  He is here today for follow-up with his daughter.  She states that over the last 2 to 3 days he has had increasing cough, increasing shortness of breath and a runny nose.  They deny any fever.  Here today he is afebrile.  His pulse oximetry is normal.  He does have prominent crackles in his right lung which is chronic.  However he also has faint crackles in his left lung suspect pulmonary edema.  He denies any pleurisy or hemoptysis or clear sputum.  He does not appear toxic today although he does frequently cough and clear his throat during our encounter Past Medical History:  Diagnosis Date   Adenocarcinoma of right lung ([place]) 01/06/2016   Anginal chest pain at rest    Chronic non, controlled on Lorazepam   Anxiety    Arthritis    "all over"    BPH (benign prostatic hyperplasia)    CAD (coronary artery disease)    Chronic lower back pain    Complication of anesthesia    "problems making water afterwards"   Depression    DJD  (degenerative joint disease)    Esophageal ulcer without bleeding    bid ppi indefinitely   Family history of adverse reaction to anesthesia    "children w/PONV"   GERD (gastroesophageal reflux disease)    Headache    "couple/week maybe" (09/04/2015)   Hemochromatosis    Possible, elevated iron stores, hook-like osteophytes on hand films, normal LFTs   Hepatitis    "yellow jaundice as a baby"   History of ASCVD    MULTIVESSEL   Hyperlipidemia    Hypertension    Mild aortic stenosis 06/23/2021   Osteoarthritis    Pneumonia    "several times; got a little now" (09/04/2015)   Rheumatoid arthritis ([place])    Subdural hematoma ([place])    small after fall 09/2016-plavix held, neurosurgery consulted.  Asymptomatic.     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[place]) 02/2018   on Lovenox lifelong since he failed PO Eliquis   Past Surgical History:  Procedure Laterality Date   ARM SKIN LESION BIOPSY / EXCISION Left 09/02/2015   CATARACT EXTRACTION W/ INTRAOCULAR LENS  IMPLANT, BILATERAL Bilateral    CHOLECYSTECTOMY OPEN     CORNEAL TRANSPLANT Bilateral    "one at [facility]; one at [facility]"   [place]  PLACEMENT     CORONARY ARTERY BYPASS GRAFT  1999   DESCENDING AORTIC ANEURYSM REPAIR W/ STENT     DE stent ostium into the right radial free graft at OM-, 02-2007   ESOPHAGOGASTRODUODENOSCOPY N/A 11/09/2014   Procedure: ESOPHAGOGASTRODUODENOSCOPY (EGD);  Surgeon: [name], MD;  Location[facility] ENDOSCOPY;  Service: Endoscopy;  Laterality: N/A;   INGUINAL HERNIA REPAIR     JOINT REPLACEMENT     LEFT HEART CATH AND CORS/GRAFTS ANGIOGRAPHY N/A 06/27/2017   Procedure: LEFT HEART CATH AND CORS/GRAFTS ANGIOGRAPHY;  Surgeon: [name], MD;  Location: [facility] CV LAB;  Service: Cardiovascular;  Laterality: N/A;   NASAL SINUS SURGERY     SHOULDER OPEN ROTATOR CUFF REPAIR Right    TOTAL KNEE ARTHROPLASTY Bilateral    Current Outpatient Medications on File Prior to Visit  Medication Sig Dispense Refill    acetaminophen (TYLENOL) 500 MG tablet Take 500 mg by mouth every 6 (six) hours as needed for mild pain, moderate pain, fever or headache.     apixaban (ELIQUIS) 2.5 MG TABS tablet Take 1 tablet (2.5 mg total) by mouth 2 (two) times daily. 60 tablet 1   Cholecalciferol (VITAMIN D-3 PO) Take 1 capsule by mouth in the morning.     cyanocobalamin 1000 MCG tablet Take 1 tablet (1,000 mcg total) by mouth daily. 30 tablet 1   ferrous sulfate 325 (65 FE) MG tablet Take 1 tablet (325 mg total) by mouth every other day. 15 tablet 1   finasteride (PROSCAR) 5 MG tablet Take 1 tablet (5 mg total) by mouth daily. 90 tablet 3   FLUoxetine (PROZAC) 20 MG capsule TAKE 1 CAPSULE EVERY DAY (Patient taking differently: Take 20 mg by mouth in the morning.) 90 capsule 0   folic acid (FOLVITE) 1 MG tablet Take 1 tablet (1 mg total) by mouth daily. 30 tablet 1   furosemide (LASIX) 40 MG tablet Take 1 tablet (40 mg total) by mouth daily. 30 tablet 0   Lifitegrast (XIIDRA) 5 % SOLN Place 1 drop into both eyes 2 (two) times daily. (Patient taking differently: Place 1 drop into both eyes 2 (two) times daily as needed (dry eyes).) 60 each 12   pantoprazole (PROTONIX) 40 MG tablet Take 1 tablet (40 mg total) by mouth in the morning. 90 tablet 0   polyethylene glycol (MIRALAX / GLYCOLAX) 17 g packet Take 17 g by mouth daily as needed for mild constipation. 14 each 0   rosuvastatin (CRESTOR) 5 MG tablet TAKE 1 TABLET EVERY DAY 90 tablet 1   tamsulosin (FLOMAX) 0.4 MG CAPS capsule TAKE 1 CAPSULE EVERY DAY (Patient taking differently: Take 0.4 mg by mouth daily with supper.) 90 capsule 10   Zinc Oxide (TRIPLE PASTE) 12.8 % ointment Apply topically 4 (four) times daily as needed (skin barrier). 56.7 g 0   ZINC OXIDE EX Apply 1 application  topically 4 (four) times daily as needed (skin barrier).     No current facility-administered medications on file prior to visit.   Allergies  Allergen Reactions   Feldene [Piroxicam] Rash     Blistering rash   Neomycin-Polymyxin-Hc Itching   Statins Other (See Comments)    Myalgias   Valium [Diazepam] Other (See Comments)    Dizziness    Latex Rash   Tequin [Gatifloxacin] Anxiety   Vibra-Tab [Doxycycline] Nausea Only   Social History   Socioeconomic History   Marital status: Widowed    Spouse name: Not on file   Number  of children: Not on file   Years of education: Not on file   Highest education level: Not on file  Occupational History   Not on file  Tobacco Use   Smoking status: Never    Passive exposure: Yes   Smokeless tobacco: Former    Types: Chew   Tobacco comments:    "quit chewing in the 1960s"  Vaping Use   Vaping Use: Never used  Substance and Sexual Activity   Alcohol use: No   Drug use: No   Sexual activity: Never  Other Topics Concern   Not on file  Social History Narrative   Not on file   Social Determinants of Health   Financial Resource Strain: Low Risk  (05/04/2022)   Overall Financial Resource Strain (CARDIA)    Difficulty of Paying Living Expenses: Not hard at all  Food Insecurity: No Food Insecurity (04/28/2022)   Hunger Vital Sign    Worried About Running Out of Food in the Last Year: Never true    [place] in the Last Year: Never true  Transportation Needs: No Transportation Needs (05/04/2022)   PRAPARE - Hydrologist (Medical): No    Lack of Transportation (Non-Medical): No  Physical Activity: Insufficiently Active (05/07/2021)   Exercise Vital Sign    Days of Exercise per Week: 3 days    Minutes of Exercise per Session: 20 min  Stress: No Stress Concern Present (05/07/2021)   [place]    Feeling of Stress : Not at all  Social Connections: Socially Isolated (05/07/2021)   Social Connection and Isolation Panel [NHANES]    Frequency of Communication with Friends and Family: More than three times a week    Frequency of Social  Gatherings with Friends and Family: More than three times a week    Attends Religious Services: Never    Marine scientist or Organizations: No    Attends Archivist Meetings: Never    Marital Status: Widowed  Intimate Partner Violence: Not At Risk (04/28/2022)   Humiliation, Afraid, Rape, and Kick questionnaire    Fear of Current or Ex-Partner: No    Emotionally Abused: No    Physically Abused: No    Sexually Abused: No      Review of Systems     Objective:   Physical Exam Vitals reviewed.  Constitutional:      General: He is not in acute distress.    Appearance: Normal appearance. He is not toxic-appearing or diaphoretic.  Cardiovascular:     Rate and Rhythm: Normal rate and regular rhythm.     Heart sounds: Normal heart sounds. No murmur heard.    No friction rub. No gallop.  Pulmonary:     Effort: Pulmonary effort is normal. No respiratory distress.     Breath sounds: Rales present. No wheezing or rhonchi.    Musculoskeletal:     Right lower leg: No edema.     Left lower leg: No edema.  Neurological:     Mental Status: He is alert.   Right-sided hemiparesis with weakness in his right leg.  Weakness with knee flexion and extension and ankle flexion and extension.  Ataxia in the right upper arm and right lower leg.         Assessment & Plan:  Stage 3 chronic kidney disease, unspecified whether stage 3a or 3b CKD ([place]) - Plan: CBC with Differential/Platelet, COMPLETE METABOLIC PANEL  WITH GFR  Acute pulmonary embolism, unspecified pulmonary embolism type, unspecified whether acute cor pulmonale present ([place]) - Plan: CBC with Differential/Platelet, COMPLETE METABOLIC PANEL WITH GFR  Acute systolic congestive heart failure ([place]) - Plan: CBC with Differential/Platelet, COMPLETE METABOLIC PANEL WITH GFR Exam today is unremarkable except for bilateral crackles.  I suspect pulmonary edema.  Have asked the family to increase his Lasix to 40 mg twice a day  for 48 hours and then resume 40 mg once a day thereafter.  Meanwhile check CBC CMP.  Patient does not appear dehydrated.  Monitor creatinine as well as potassium.  If the patient develops any fever or purulent sputum I would add antibiotics to cover possible hospital-acquired pneumonia.  I gave the patient Augmentin.  The patient's daughter has strict instructions on when to start the antibiotics but at the present time we will get a try diuresis for the next 48 hours

## 2022-05-11 ENCOUNTER — Other Ambulatory Visit (HOSPITAL_COMMUNITY): Payer: Self-pay

## 2022-05-11 ENCOUNTER — Emergency Department (HOSPITAL_COMMUNITY): Payer: Medicare HMO

## 2022-05-11 ENCOUNTER — Inpatient Hospital Stay (HOSPITAL_COMMUNITY)
Admission: EM | Admit: 2022-05-11 | Discharge: 2022-05-17 | DRG: 291 | Disposition: A | Discharge status: Hospice | Payer: Medicare HMO | Attending: Internal Medicine | Admitting: Internal Medicine

## 2022-05-11 ENCOUNTER — Ambulatory Visit: Payer: Self-pay | Admitting: *Deleted

## 2022-05-11 ENCOUNTER — Other Ambulatory Visit: Payer: Self-pay

## 2022-05-11 DIAGNOSIS — N1832 Chronic kidney disease, stage 3b: Secondary | ICD-10-CM | POA: Diagnosis present

## 2022-05-11 DIAGNOSIS — N183 Chronic kidney disease, stage 3 unspecified: Secondary | ICD-10-CM | POA: Diagnosis present

## 2022-05-11 DIAGNOSIS — Z515 Encounter for palliative care: Secondary | ICD-10-CM | POA: Diagnosis not present

## 2022-05-11 DIAGNOSIS — J811 Chronic pulmonary edema: Secondary | ICD-10-CM | POA: Diagnosis not present

## 2022-05-11 DIAGNOSIS — Z66 Do not resuscitate: Secondary | ICD-10-CM | POA: Diagnosis not present

## 2022-05-11 DIAGNOSIS — L8990 Pressure ulcer of unspecified site, unspecified stage: Secondary | ICD-10-CM | POA: Diagnosis present

## 2022-05-11 DIAGNOSIS — I4821 Permanent atrial fibrillation: Secondary | ICD-10-CM | POA: Diagnosis present

## 2022-05-11 DIAGNOSIS — I2699 Other pulmonary embolism without acute cor pulmonale: Secondary | ICD-10-CM | POA: Diagnosis present

## 2022-05-11 DIAGNOSIS — Z7189 Other specified counseling: Secondary | ICD-10-CM | POA: Diagnosis not present

## 2022-05-11 DIAGNOSIS — F32A Depression, unspecified: Secondary | ICD-10-CM | POA: Diagnosis present

## 2022-05-11 DIAGNOSIS — Z7901 Long term (current) use of anticoagulants: Secondary | ICD-10-CM | POA: Diagnosis not present

## 2022-05-11 DIAGNOSIS — E43 Unspecified severe protein-calorie malnutrition: Secondary | ICD-10-CM | POA: Diagnosis not present

## 2022-05-11 DIAGNOSIS — Z951 Presence of aortocoronary bypass graft: Secondary | ICD-10-CM | POA: Diagnosis not present

## 2022-05-11 DIAGNOSIS — R0989 Other specified symptoms and signs involving the circulatory and respiratory systems: Principal | ICD-10-CM

## 2022-05-11 DIAGNOSIS — M545 Low back pain, unspecified: Secondary | ICD-10-CM | POA: Diagnosis present

## 2022-05-11 DIAGNOSIS — B974 Respiratory syncytial virus as the cause of diseases classified elsewhere: Secondary | ICD-10-CM | POA: Diagnosis present

## 2022-05-11 DIAGNOSIS — R338 Other retention of urine: Secondary | ICD-10-CM | POA: Diagnosis present

## 2022-05-11 DIAGNOSIS — N401 Enlarged prostate with lower urinary tract symptoms: Secondary | ICD-10-CM | POA: Diagnosis present

## 2022-05-11 DIAGNOSIS — Z955 Presence of coronary angioplasty implant and graft: Secondary | ICD-10-CM

## 2022-05-11 DIAGNOSIS — C3491 Malignant neoplasm of unspecified part of right bronchus or lung: Secondary | ICD-10-CM | POA: Diagnosis not present

## 2022-05-11 DIAGNOSIS — R0689 Other abnormalities of breathing: Secondary | ICD-10-CM | POA: Diagnosis not present

## 2022-05-11 DIAGNOSIS — E871 Hypo-osmolality and hyponatremia: Secondary | ICD-10-CM | POA: Diagnosis not present

## 2022-05-11 DIAGNOSIS — L89151 Pressure ulcer of sacral region, stage 1: Secondary | ICD-10-CM | POA: Diagnosis present

## 2022-05-11 DIAGNOSIS — Z9841 Cataract extraction status, right eye: Secondary | ICD-10-CM

## 2022-05-11 DIAGNOSIS — I4891 Unspecified atrial fibrillation: Secondary | ICD-10-CM | POA: Diagnosis not present

## 2022-05-11 DIAGNOSIS — R1900 Intra-abdominal and pelvic swelling, mass and lump, unspecified site: Secondary | ICD-10-CM | POA: Diagnosis not present

## 2022-05-11 DIAGNOSIS — Z961 Presence of intraocular lens: Secondary | ICD-10-CM | POA: Diagnosis present

## 2022-05-11 DIAGNOSIS — E876 Hypokalemia: Secondary | ICD-10-CM | POA: Diagnosis not present

## 2022-05-11 DIAGNOSIS — J438 Other emphysema: Secondary | ICD-10-CM | POA: Diagnosis not present

## 2022-05-11 DIAGNOSIS — L89629 Pressure ulcer of left heel, unspecified stage: Secondary | ICD-10-CM | POA: Diagnosis not present

## 2022-05-11 DIAGNOSIS — R0602 Shortness of breath: Secondary | ICD-10-CM | POA: Diagnosis present

## 2022-05-11 DIAGNOSIS — I5023 Acute on chronic systolic (congestive) heart failure: Secondary | ICD-10-CM | POA: Diagnosis present

## 2022-05-11 DIAGNOSIS — M069 Rheumatoid arthritis, unspecified: Secondary | ICD-10-CM | POA: Diagnosis present

## 2022-05-11 DIAGNOSIS — I491 Atrial premature depolarization: Secondary | ICD-10-CM | POA: Diagnosis not present

## 2022-05-11 DIAGNOSIS — Z79899 Other long term (current) drug therapy: Secondary | ICD-10-CM

## 2022-05-11 DIAGNOSIS — T502X5A Adverse effect of carbonic-anhydrase inhibitors, benzothiadiazides and other diuretics, initial encounter: Secondary | ICD-10-CM | POA: Diagnosis present

## 2022-05-11 DIAGNOSIS — M109 Gout, unspecified: Secondary | ICD-10-CM | POA: Diagnosis present

## 2022-05-11 DIAGNOSIS — Z96653 Presence of artificial knee joint, bilateral: Secondary | ICD-10-CM | POA: Diagnosis present

## 2022-05-11 DIAGNOSIS — I5033 Acute on chronic diastolic (congestive) heart failure: Secondary | ICD-10-CM | POA: Diagnosis present

## 2022-05-11 DIAGNOSIS — I4892 Unspecified atrial flutter: Secondary | ICD-10-CM | POA: Diagnosis not present

## 2022-05-11 DIAGNOSIS — I2510 Atherosclerotic heart disease of native coronary artery without angina pectoris: Secondary | ICD-10-CM | POA: Diagnosis present

## 2022-05-11 DIAGNOSIS — Z85118 Personal history of other malignant neoplasm of bronchus and lung: Secondary | ICD-10-CM

## 2022-05-11 DIAGNOSIS — Z947 Corneal transplant status: Secondary | ICD-10-CM

## 2022-05-11 DIAGNOSIS — E785 Hyperlipidemia, unspecified: Secondary | ICD-10-CM | POA: Diagnosis not present

## 2022-05-11 DIAGNOSIS — F0390 Unspecified dementia without behavioral disturbance: Secondary | ICD-10-CM | POA: Diagnosis present

## 2022-05-11 DIAGNOSIS — Z91040 Latex allergy status: Secondary | ICD-10-CM

## 2022-05-11 DIAGNOSIS — R0902 Hypoxemia: Secondary | ICD-10-CM | POA: Diagnosis not present

## 2022-05-11 DIAGNOSIS — N179 Acute kidney failure, unspecified: Secondary | ICD-10-CM | POA: Diagnosis present

## 2022-05-11 DIAGNOSIS — J441 Chronic obstructive pulmonary disease with (acute) exacerbation: Secondary | ICD-10-CM | POA: Diagnosis present

## 2022-05-11 DIAGNOSIS — Z6826 Body mass index (BMI) 26.0-26.9, adult: Secondary | ICD-10-CM

## 2022-05-11 DIAGNOSIS — Z888 Allergy status to other drugs, medicaments and biological substances status: Secondary | ICD-10-CM

## 2022-05-11 DIAGNOSIS — K219 Gastro-esophageal reflux disease without esophagitis: Secondary | ICD-10-CM | POA: Diagnosis present

## 2022-05-11 DIAGNOSIS — I13 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 failure and stage 1 through stage 4 chronic kidney disease, or unspecified chronic kidney disease: Principal | ICD-10-CM | POA: Diagnosis present

## 2022-05-11 DIAGNOSIS — Z9842 Cataract extraction status, left eye: Secondary | ICD-10-CM

## 2022-05-11 DIAGNOSIS — R9431 Abnormal electrocardiogram [ECG] [EKG]: Secondary | ICD-10-CM | POA: Diagnosis present

## 2022-05-11 DIAGNOSIS — N1831 Chronic kidney disease, stage 3a: Secondary | ICD-10-CM | POA: Diagnosis present

## 2022-05-11 DIAGNOSIS — L89109 Pressure ulcer of unspecified part of back, unspecified stage: Secondary | ICD-10-CM | POA: Diagnosis present

## 2022-05-11 DIAGNOSIS — N138 Other obstructive and reflux uropathy: Secondary | ICD-10-CM | POA: Diagnosis not present

## 2022-05-11 DIAGNOSIS — J449 Chronic obstructive pulmonary disease, unspecified: Secondary | ICD-10-CM | POA: Diagnosis present

## 2022-05-11 DIAGNOSIS — Z1152 Encounter for screening for COVID-19: Secondary | ICD-10-CM | POA: Diagnosis not present

## 2022-05-11 DIAGNOSIS — I10 Essential (primary) hypertension: Secondary | ICD-10-CM | POA: Diagnosis not present

## 2022-05-11 DIAGNOSIS — I499 Cardiac arrhythmia, unspecified: Secondary | ICD-10-CM | POA: Diagnosis not present

## 2022-05-11 DIAGNOSIS — J90 Pleural effusion, not elsewhere classified: Secondary | ICD-10-CM | POA: Diagnosis not present

## 2022-05-11 DIAGNOSIS — Z8249 Family history of ischemic heart disease and other diseases of the circulatory system: Secondary | ICD-10-CM

## 2022-05-11 DIAGNOSIS — F419 Anxiety disorder, unspecified: Secondary | ICD-10-CM | POA: Diagnosis present

## 2022-05-11 DIAGNOSIS — G8929 Other chronic pain: Secondary | ICD-10-CM | POA: Diagnosis present

## 2022-05-11 DIAGNOSIS — N400 Enlarged prostate without lower urinary tract symptoms: Secondary | ICD-10-CM | POA: Diagnosis present

## 2022-05-11 DIAGNOSIS — Z86711 Personal history of pulmonary embolism: Secondary | ICD-10-CM

## 2022-05-11 LAB — COMPLETE METABOLIC PANEL WITH GFR
AG Ratio: 1.1 (calc) (ref 1.0–2.5)
ALT: 8 U/L — ABNORMAL LOW (ref 9–46)
AST: 17 U/L (ref 10–35)
Albumin: 3.8 g/dL (ref 3.6–5.1)
Alkaline phosphatase (APISO): 102 U/L (ref 35–144)
BUN/Creatinine Ratio: 21 (calc) (ref 6–22)
BUN: 31 mg/dL — ABNORMAL HIGH (ref 7–25)
CO2: 23 mmol/L (ref 20–32)
Calcium: 8.7 mg/dL (ref 8.6–10.3)
Chloride: 100 mmol/L (ref 98–110)
Creat: 1.5 mg/dL — ABNORMAL HIGH (ref 0.70–1.22)
Globulin: 3.4 g/dL (calc) (ref 1.9–3.7)
Glucose, Bld: 86 mg/dL (ref 65–99)
Potassium: 3.9 mmol/L (ref 3.5–5.3)
Sodium: 134 mmol/L — ABNORMAL LOW (ref 135–146)
Total Bilirubin: 0.7 mg/dL (ref 0.2–1.2)
Total Protein: 7.2 g/dL (ref 6.1–8.1)
eGFR: 43 mL/min/{1.73_m2} — ABNORMAL LOW (ref >=60)

## 2022-05-11 LAB — CBC WITH DIFFERENTIAL/PLATELET
Absolute Monocytes: 649 cells/uL (ref 200–950)
Basophils Absolute: 28 cells/uL (ref 0–200)
Basophils Relative: 0.6 %
Eosinophils Absolute: 9 cells/uL — ABNORMAL LOW (ref 15–500)
Eosinophils Relative: 0.2 %
HCT: 29.5 % — ABNORMAL LOW (ref 38.5–50.0)
Hemoglobin: 9.7 g/dL — ABNORMAL LOW (ref 13.2–17.1)
Lymphs Abs: 949 cells/uL (ref 850–3900)
MCH: 29.7 pg (ref 27.0–33.0)
MCHC: 32.9 g/dL (ref 32.0–36.0)
MCV: 90.2 fL (ref 80.0–100.0)
MPV: 11.4 fL (ref 7.5–12.5)
Monocytes Relative: 13.8 %
Neutro Abs: 3064 cells/uL (ref 1500–7800)
Neutrophils Relative %: 65.2 %
Platelets: 130 10*3/uL — ABNORMAL LOW (ref 140–400)
RBC: 3.27 10*6/uL — ABNORMAL LOW (ref 4.20–5.80)
RDW: 14.6 % (ref 11.0–15.0)
Total Lymphocyte: 20.2 %
WBC: 4.7 10*3/uL (ref 3.8–10.8)

## 2022-05-11 LAB — COMPREHENSIVE METABOLIC PANEL
ALT: 11 U/L (ref 0–44)
AST: 27 U/L (ref 15–41)
Albumin: 3.3 g/dL — ABNORMAL LOW (ref 3.5–5.0)
Alkaline Phosphatase: 78 U/L (ref 38–126)
Anion gap: 12 (ref 5–15)
BUN: 31 mg/dL — ABNORMAL HIGH (ref 8–23)
CO2: 20 mmol/L — ABNORMAL LOW (ref 22–32)
Calcium: 8.3 mg/dL — ABNORMAL LOW (ref 8.9–10.3)
Chloride: 98 mmol/L (ref 98–111)
Creatinine, Ser: 1.64 mg/dL — ABNORMAL HIGH (ref 0.61–1.24)
GFR, Estimated: 39 mL/min — ABNORMAL LOW (ref >60)
Glucose, Bld: 98 mg/dL (ref 70–99)
Potassium: 3.7 mmol/L (ref 3.5–5.1)
Sodium: 130 mmol/L — ABNORMAL LOW (ref 135–145)
Total Bilirubin: 0.4 mg/dL (ref 0.3–1.2)
Total Protein: 6.8 g/dL (ref 6.5–8.1)

## 2022-05-11 LAB — METHYLMALONIC ACID, SERUM: Methylmalonic Acid, Quantitative: 363 nmol/L (ref 0–378)

## 2022-05-11 LAB — TROPONIN I (HIGH SENSITIVITY): Troponin I (High Sensitivity): 23 ng/L — ABNORMAL HIGH (ref <18)

## 2022-05-11 NOTE — ED Triage Notes (Signed)
Pt coming from home, doubled lasix per Dr order and dr said if he doesn't produce a decent amount of urine to call 911...  Sugar 191 Hx of afib and right sided PE 122/45 HR 75 96% room air

## 2022-05-11 NOTE — ED Provider Notes (Signed)
[facility] Provider Note   CSN: [id number] Arrival date & time: 05/11/22  2217     History {Add pertinent medical, surgical, social history, OB history to HPI:1} Chief Complaint  Patient presents with   Urinary Retention    Evan [name] is a 89 y.o. male.  The history is provided by the patient and medical records.    89 y.o. male with history of PE on Eliquis, depression, COPD, CHF with EF of 50 to 55%, CAD status post CABG, chronic kidney disease, presenting to the ED with decreased urine output and shortness of breath.  Patient recently hospitalized 04/28/22- 05/03/22 for acute PE, CHF exacerbation.  He had office follow-up yesterday, felt to have rales on exam and lasix was doubled from 40mg  daily to 40mg  BID x48 hours.  He was also given script for augmentin for possible PNA.  Patient has not had any good urine output recently so family was concerned.  He does admit that he is still coughing, unsure if SOB worse than at office visit yesterday.    Home Medications Prior to Admission medications   Medication Sig Start Date End Date Taking? Authorizing Provider  acetaminophen (TYLENOL) 500 MG tablet Take 500 mg by mouth every 6 (six) hours as needed for mild pain, moderate pain, fever or headache.    [provider]  amoxicillin-clavulanate (AUGMENTIN) 875-125 MG tablet Take 1 tablet by mouth 2 (two) times daily. 05/10/22   [name], MD  apixaban (ELIQUIS) 2.5 MG TABS tablet Take 1 tablet (2.5 mg total) by mouth 2 (two) times daily. 05/03/22 07/02/22  Elodia Florence., MD  Cholecalciferol (VITAMIN D-3 PO) Take 1 capsule by mouth in the morning.    [provider]  cyanocobalamin 1000 MCG tablet Take 1 tablet (1,000 mcg total) by mouth daily. 05/04/22 07/03/22  Elodia Florence., MD  ferrous sulfate 325 (65 FE) MG tablet Take 1 tablet (325 mg total) by mouth every other day. 05/03/22 07/02/22  Elodia Florence., MD  finasteride (PROSCAR) 5 MG tablet Take 1 tablet (5 mg total) by mouth daily. 04/11/22   [name], MD  FLUoxetine (PROZAC) 20 MG capsule TAKE 1 CAPSULE EVERY DAY Patient taking differently: Take 20 mg by mouth in the morning. 01/05/22   [name], MD  folic acid (FOLVITE) 1 MG tablet Take 1 tablet (1 mg total) by mouth daily. 05/04/22 07/03/22  Elodia Florence., MD  furosemide (LASIX) 40 MG tablet Take 1 tablet (40 mg total) by mouth daily. 05/04/22 06/03/22  Elodia Florence., MD  Lifitegrast Shirley Friar) 5 % SOLN Place 1 drop into both eyes 2 (two) times daily. Patient taking differently: Place 1 drop into both eyes 2 (two) times daily as needed (dry eyes). 12/01/21     pantoprazole (PROTONIX) 40 MG tablet Take 1 tablet (40 mg total) by mouth in the morning. 04/18/22   [name], MD  polyethylene glycol (MIRALAX / GLYCOLAX) 17 g packet Take 17 g by mouth daily as needed for mild constipation. 08/14/18   Angiulli, Lavon Paganini, PA-C  rosuvastatin (CRESTOR) 5 MG tablet TAKE 1 TABLET EVERY DAY 05/06/22   Jettie Booze, MD  tamsulosin (FLOMAX) 0.4 MG CAPS capsule TAKE 1 CAPSULE EVERY DAY Patient taking differently: Take 0.4 mg by mouth daily with supper. 01/10/22   [name], MD  Zinc Oxide (TRIPLE PASTE) 12.8 % ointment Apply topically 4 (  four) times daily as needed (skin barrier). 05/03/22   Elodia Florence., MD  ZINC OXIDE EX Apply 1 application  topically 4 (four) times daily as needed (skin barrier).    [provider]      Allergies    Feldene [piroxicam], Neomycin-polymyxin-hc, Statins, Valium [diazepam], Latex, Tequin [gatifloxacin], and Vibra-tab [doxycycline]    Review of Systems   Review of Systems  Respiratory:  Positive for cough and shortness of breath.   All other systems reviewed and are negative.   Physical Exam Updated Vital Signs Ht 5\' 3"  (1.6 m)   Wt 68.9 kg   BMI 26.91 kg/m  Physical Exam Vitals and nursing note  reviewed.  Constitutional:      Appearance: He is well-developed.  HENT:     Head: Normocephalic and atraumatic.  Eyes:     Conjunctiva/sclera: Conjunctivae normal.     Pupils: Pupils are equal, round, and reactive to light.  Cardiovascular:     Rate and Rhythm: Normal rate and regular rhythm.     Heart sounds: Normal heart sounds.  Pulmonary:     Effort: Pulmonary effort is normal.     Breath sounds: Normal breath sounds.  Abdominal:     General: Bowel sounds are normal.     Palpations: Abdomen is soft.  Musculoskeletal:        General: Normal range of motion.     Cervical back: Normal range of motion.  Skin:    General: Skin is warm and dry.  Neurological:     Mental Status: He is alert and oriented to person, place, and time.     ED Results / Procedures / Treatments   Labs (all labs ordered are listed, but only abnormal results are displayed) Labs Reviewed  RESP PANEL BY RT-PCR (RSV, FLU A&B, COVID)  [id number]  RESPIRATORY PANEL BY PCR  CBC WITH DIFFERENTIAL/PLATELET  COMPREHENSIVE METABOLIC PANEL  BRAIN NATRIURETIC PEPTIDE  TROPONIN I (HIGH SENSITIVITY)    EKG None  Radiology DG Chest Port 1 View  Result Date: 05/11/2022 CLINICAL DATA:  Shortness of breath edema EXAM: PORTABLE CHEST 1 VIEW COMPARISON:  05/01/2022 FINDINGS: Post sternotomy changes. Small right-sided pleural effusion. Cardiomegaly with vascular congestion versus bronchitic change. Aortic atherosclerosis. IMPRESSION: Small right pleural effusion. Cardiomegaly with vascular congestion versus bronchitic change. Electronically Signed   By: Donavan Foil M.D.   On: 05/11/2022 22:44    Procedures Procedures  {Document cardiac monitor, telemetry assessment procedure when appropriate:1}  Medications Ordered in ED Medications - No data to display  ED Course/ Medical Decision Making/ A&P   {   Click here for ABCD2, HEART and other calculatorsREFRESH Note before signing :1}                           Medical Decision Making Amount and/or Complexity of Data Reviewed Labs: ordered. Radiology: ordered.   ***  {Document critical care time when appropriate:1} {Document review of labs and clinical decision tools ie heart score, Chads2Vasc2 etc:1}  {Document your independent review of radiology images, and any outside records:1} {Document your discussion with family members, caretakers, and with consultants:1} {Document social determinants of health affecting pt's care:1} {Document your decision making why or why not admission, treatments were needed:1} Final Clinical Impression(s) / ED Diagnoses Final diagnoses:  None    Rx / DC Orders ED Discharge Orders     None

## 2022-05-11 NOTE — Patient Outreach (Signed)
  Care Coordination   05/11/2022 Name: Evan [name] MRN: [id number] DOB: 1927-06-18   Care Coordination Outreach Attempts:  An unsuccessful telephone outreach was attempted for a scheduled appointment today.  Follow Up Plan:  Additional outreach attempts will be made to offer the patient care coordination information and services.   Encounter Outcome:  No Answer. Busy x 3.   Care Coordination Interventions:  Yes, provided   Chart reviewed. Hosp f/u with PCP yesterday. Lasix increased to 40mg  BID x 48 hrs and then resume 40mg  daily. Given antibiotics with strict rules on when to start if needed. Message sent to scheduling care guide to schedule a care coordination f/u with Jackelyn Poling, RN Care Coordinator [phone]. I will remove myself from the Care Team and let Maudie Mercury take over Care Coordination from here.    Chong Sicilian, BSN, RN-BC RN Care Coordinator [place] Direct Dial: [phone] Main #: [phone]

## 2022-05-12 ENCOUNTER — Telehealth: Payer: Self-pay

## 2022-05-12 DIAGNOSIS — Z7189 Other specified counseling: Secondary | ICD-10-CM

## 2022-05-12 DIAGNOSIS — Z515 Encounter for palliative care: Secondary | ICD-10-CM

## 2022-05-12 DIAGNOSIS — I5023 Acute on chronic systolic (congestive) heart failure: Secondary | ICD-10-CM | POA: Diagnosis present

## 2022-05-12 DIAGNOSIS — E43 Unspecified severe protein-calorie malnutrition: Secondary | ICD-10-CM | POA: Diagnosis present

## 2022-05-12 DIAGNOSIS — I5033 Acute on chronic diastolic (congestive) heart failure: Secondary | ICD-10-CM | POA: Diagnosis present

## 2022-05-12 LAB — BASIC METABOLIC PANEL
Anion gap: 12 (ref 5–15)
BUN: 29 mg/dL — ABNORMAL HIGH (ref 8–23)
CO2: 22 mmol/L (ref 22–32)
Calcium: 8.2 mg/dL — ABNORMAL LOW (ref 8.9–10.3)
Chloride: 97 mmol/L — ABNORMAL LOW (ref 98–111)
Creatinine, Ser: 1.53 mg/dL — ABNORMAL HIGH (ref 0.61–1.24)
GFR, Estimated: 42 mL/min — ABNORMAL LOW (ref >60)
Glucose, Bld: 101 mg/dL — ABNORMAL HIGH (ref 70–99)
Potassium: 2.9 mmol/L — ABNORMAL LOW (ref 3.5–5.1)
Sodium: 131 mmol/L — ABNORMAL LOW (ref 135–145)

## 2022-05-12 LAB — CBC
HCT: 27.4 % — ABNORMAL LOW (ref 39.0–52.0)
Hemoglobin: 9 g/dL — ABNORMAL LOW (ref 13.0–17.0)
MCH: 30.2 pg (ref 26.0–34.0)
MCHC: 32.8 g/dL (ref 30.0–36.0)
MCV: 91.9 fL (ref 80.0–100.0)
Platelets: 104 10*3/uL — ABNORMAL LOW (ref 150–400)
RBC: 2.98 MIL/uL — ABNORMAL LOW (ref 4.22–5.81)
RDW: 15.8 % — ABNORMAL HIGH (ref 11.5–15.5)
WBC: 4.5 10*3/uL (ref 4.0–10.5)
nRBC: 0 % (ref 0.0–0.2)

## 2022-05-12 LAB — CBC WITH DIFFERENTIAL/PLATELET
Abs Immature Granulocytes: 0.05 10*3/uL (ref 0.00–0.07)
Basophils Absolute: 0 10*3/uL (ref 0.0–0.1)
Basophils Relative: 1 %
Eosinophils Absolute: 0 10*3/uL (ref 0.0–0.5)
Eosinophils Relative: 1 %
HCT: 28.6 % — ABNORMAL LOW (ref 39.0–52.0)
Hemoglobin: 9 g/dL — ABNORMAL LOW (ref 13.0–17.0)
Immature Granulocytes: 1 %
Lymphocytes Relative: 30 %
Lymphs Abs: 1.2 10*3/uL (ref 0.7–4.0)
MCH: 29.8 pg (ref 26.0–34.0)
MCHC: 31.5 g/dL (ref 30.0–36.0)
MCV: 94.7 fL (ref 80.0–100.0)
Monocytes Absolute: 0.6 10*3/uL (ref 0.1–1.0)
Monocytes Relative: 15 %
Neutro Abs: 2.1 10*3/uL (ref 1.7–7.7)
Neutrophils Relative %: 52 %
Platelets: 106 10*3/uL — ABNORMAL LOW (ref 150–400)
RBC: 3.02 MIL/uL — ABNORMAL LOW (ref 4.22–5.81)
RDW: 15.9 % — ABNORMAL HIGH (ref 11.5–15.5)
Smear Review: NORMAL
WBC: 4 10*3/uL (ref 4.0–10.5)
nRBC: 0 % (ref 0.0–0.2)

## 2022-05-12 LAB — RESPIRATORY PANEL BY PCR

## 2022-05-12 LAB — BRAIN NATRIURETIC PEPTIDE: B Natriuretic Peptide: 169.5 pg/mL — ABNORMAL HIGH (ref 0.0–100.0)

## 2022-05-12 LAB — RESP PANEL BY RT-PCR (RSV, FLU A&B, COVID)  RVPGX2
Influenza A by PCR: NEGATIVE
Influenza B by PCR: NEGATIVE
Resp Syncytial Virus by PCR: POSITIVE — AB
SARS Coronavirus 2 by RT PCR: NEGATIVE

## 2022-05-12 LAB — MAGNESIUM: Magnesium: 1.9 mg/dL (ref 1.7–2.4)

## 2022-05-12 LAB — TROPONIN I (HIGH SENSITIVITY): Troponin I (High Sensitivity): 26 ng/L — ABNORMAL HIGH (ref <18)

## 2022-05-12 MED ORDER — ROSUVASTATIN CALCIUM 5 MG PO TABS
5.0000 mg | ORAL_TABLET | Freq: Every day | ORAL | Status: DC
  Administered 2022-05-12 – 2022-05-17 (×6): 5 mg via ORAL
  Filled 2022-05-12 (×6): qty 1

## 2022-05-12 MED ORDER — ADULT MULTIVITAMIN W/MINERALS CH
1.0000 | ORAL_TABLET | Freq: Every day | ORAL | Status: DC
  Administered 2022-05-12 – 2022-05-17 (×6): 1 via ORAL
  Filled 2022-05-12 (×6): qty 1

## 2022-05-12 MED ORDER — PANTOPRAZOLE SODIUM 40 MG PO TBEC
40.0000 mg | DELAYED_RELEASE_TABLET | Freq: Every day | ORAL | Status: DC
  Administered 2022-05-12 – 2022-05-17 (×6): 40 mg via ORAL
  Filled 2022-05-12 (×6): qty 1

## 2022-05-12 MED ORDER — TAMSULOSIN HCL 0.4 MG PO CAPS
0.4000 mg | ORAL_CAPSULE | Freq: Every day | ORAL | Status: DC
  Administered 2022-05-12 – 2022-05-16 (×5): 0.4 mg via ORAL
  Filled 2022-05-12 (×5): qty 1

## 2022-05-12 MED ORDER — ACETAMINOPHEN 325 MG PO TABS
650.0000 mg | ORAL_TABLET | Freq: Four times a day (QID) | ORAL | Status: DC | PRN
  Administered 2022-05-13 – 2022-05-16 (×2): 650 mg via ORAL
  Filled 2022-05-12 (×2): qty 2

## 2022-05-12 MED ORDER — HYDRALAZINE HCL 20 MG/ML IJ SOLN: 5.0000 mg | INTRAMUSCULAR | Status: DC | PRN

## 2022-05-12 MED ORDER — ENSURE ENLIVE PO LIQD
237.0000 mL | Freq: Two times a day (BID) | ORAL | Status: DC
  Administered 2022-05-13 – 2022-05-17 (×8): 237 mL via ORAL

## 2022-05-12 MED ORDER — LIFITEGRAST 5 % OP SOLN: 1.0000 [drp] | Freq: Two times a day (BID) | OPHTHALMIC | Status: DC | PRN

## 2022-05-12 MED ORDER — ACETAMINOPHEN 650 MG RE SUPP: 650.0000 mg | Freq: Four times a day (QID) | RECTAL | Status: DC | PRN

## 2022-05-12 MED ORDER — POLYETHYLENE GLYCOL 3350 17 G PO PACK: 17.0000 g | PACK | Freq: Every day | ORAL | Status: DC | PRN

## 2022-05-12 MED ORDER — FUROSEMIDE 10 MG/ML IJ SOLN
40.0000 mg | Freq: Two times a day (BID) | INTRAMUSCULAR | Status: DC
  Administered 2022-05-12 – 2022-05-13 (×3): 40 mg via INTRAVENOUS
  Filled 2022-05-12 (×3): qty 4

## 2022-05-12 MED ORDER — BISACODYL 5 MG PO TBEC: 5.0000 mg | DELAYED_RELEASE_TABLET | Freq: Every day | ORAL | Status: DC | PRN

## 2022-05-12 MED ORDER — DOCUSATE SODIUM 100 MG PO CAPS
100.0000 mg | ORAL_CAPSULE | Freq: Two times a day (BID) | ORAL | Status: DC
  Administered 2022-05-13 – 2022-05-16 (×5): 100 mg via ORAL
  Filled 2022-05-12 (×8): qty 1

## 2022-05-12 MED ORDER — APIXABAN 2.5 MG PO TABS
2.5000 mg | ORAL_TABLET | Freq: Two times a day (BID) | ORAL | Status: DC
  Administered 2022-05-12 – 2022-05-17 (×11): 2.5 mg via ORAL
  Filled 2022-05-12 (×11): qty 1

## 2022-05-12 MED ORDER — FUROSEMIDE 10 MG/ML IJ SOLN
40.0000 mg | INTRAMUSCULAR | Status: AC
  Administered 2022-05-12: 40 mg via INTRAVENOUS
  Filled 2022-05-12: qty 4

## 2022-05-12 MED ORDER — FINASTERIDE 5 MG PO TABS
5.0000 mg | ORAL_TABLET | Freq: Every day | ORAL | Status: DC
  Administered 2022-05-12 – 2022-05-17 (×6): 5 mg via ORAL
  Filled 2022-05-12 (×6): qty 1

## 2022-05-12 MED ORDER — ALBUTEROL SULFATE (2.5 MG/3ML) 0.083% IN NEBU
2.5000 mg | INHALATION_SOLUTION | RESPIRATORY_TRACT | Status: DC | PRN
  Administered 2022-05-15: 2.5 mg via RESPIRATORY_TRACT
  Filled 2022-05-12: qty 3

## 2022-05-12 MED ORDER — POTASSIUM CHLORIDE CRYS ER 20 MEQ PO TBCR
30.0000 meq | EXTENDED_RELEASE_TABLET | ORAL | Status: AC
  Administered 2022-05-12 (×3): 30 meq via ORAL
  Filled 2022-05-12 (×3): qty 1

## 2022-05-12 MED ORDER — FLUOXETINE HCL 20 MG PO CAPS
20.0000 mg | ORAL_CAPSULE | Freq: Every day | ORAL | Status: DC
  Administered 2022-05-12 – 2022-05-17 (×6): 20 mg via ORAL
  Filled 2022-05-12 (×6): qty 1

## 2022-05-12 MED ORDER — CHLORHEXIDINE GLUCONATE CLOTH 2 % EX PADS
6.0000 | MEDICATED_PAD | Freq: Every day | CUTANEOUS | Status: DC
  Administered 2022-05-14 – 2022-05-17 (×4): 6 via TOPICAL

## 2022-05-12 MED ORDER — ZINC OXIDE 12.8 % EX OINT
TOPICAL_OINTMENT | Freq: Four times a day (QID) | CUTANEOUS | Status: DC | PRN
  Administered 2022-05-15: 1 via TOPICAL
  Filled 2022-05-12 (×2): qty 56.7

## 2022-05-12 MED ORDER — SODIUM CHLORIDE 0.9% FLUSH
3.0000 mL | Freq: Two times a day (BID) | INTRAVENOUS | Status: DC
  Administered 2022-05-12 – 2022-05-15 (×8): 3 mL via INTRAVENOUS

## 2022-05-12 NOTE — Progress Notes (Signed)
Heart Failure Navigator Progress Note  Assessed for Heart & Vascular TOC clinic readiness.  Patient EF 50-55%, Hx: dementia per MD note, with poor mobility.   Navigator will sign off at this time.   Earnestine Leys, BSN, Clinical cytogeneticist Only

## 2022-05-12 NOTE — Progress Notes (Addendum)
PROGRESS NOTE    TRAVARUS TRUDO  ZOX:[id number] DOB: Apr 18, 1927 DOA: 05/11/2022 PCP: [name], MD    Brief Narrative:   Evan [name] is a 89 y.o. male with past medical history significant for lung cancer (2017), BPH, CAD s/p CABG, HTN, HLD, RA, dementia; and VTE on Eliquis presenting with SOB.  He was last hospitalized from 1/24-30 with acute on chronic systolic CHF and recurrent acute PE, restarted on Eliquis per hematology recommendation.  He was eating well in the hospital but over the last week has had more respiratory congestion/SOB and decreased appetite.  He has not been able to fully empty his bladder.  He was seen by his PCP on 2/6 and was noted to have crackles, concerning for pulmonary edema.  Lasix was doubled to 40 mg BID x 48 hours.  He continued to have symptoms and so family called EMS and patient was transported to [facility] for further evaluation.  In the ED, temperature 98.5 F, HR 73, RR 26, BP 129/59, SpO2 95% on 2 L nasal cannula.  WBC 4.0, hemoglobin 9.0, platelets 106.  Sodium 130, potassium 3.7, chloride 98, CO2 20, glucose 98, BUN 31, creatinine 1.64.  AST 27, ALT 11, total bilirubin 0.4.  BNP 169.5.  High sensitive troponin 23 followed by 26.  RSV PCR positive.  Influenza A/B/COVID PCR negative.  Chest x-ray with small right pleural effusion, cardiomegaly with vascular congestion.  Patient was noted to have urinary retention in the ED and Foley catheter was placed with 700 mL of urine output.  TRH consulted for admission for further evaluation management of CHF exacerbation in the setting of RSV viral infection.  Assessment & Plan:   Acute on chronic systolic congestive heart failure exacerbation Patient presenting to the hospital with shortness of breath.  Was recently increased on home Lasix by PCP without appreciable improvement.  Chest x-ray on admission with noted vascular congestion with BNP elevation. Currently not requiring oxygen --Lasix 40 mg IV  every 12 hours -- Strict I's and O's and daily weights -- BMP daily  RSV viral infection RSV PCR positive on admission.  Not requiring oxygen. --Oral neb as needed -- Supportive care  Urinary retention in the setting of BPH Patient was noted to have urinary retention in the ED and Foley catheter was placed with 700 mL of rapid urine output. -- Foley catheter -- Continue finasteride and tamsulosin -- Outpatient follow-up with urology  Hypokalemia Potassium 2.9, likely etiology from IV diuresis.  Magnesium 1.9.  Will replete potassium today. -- Repeat electrolytes in a.m.  AKI on CKD stage IIIa Creatinine elevated 1.66 on admission.  Etiology likely secondary to urinary retention versus decreased renal perfusion from acute CHF exacerbation. -- Cr 1.66>1.53 -- Foley catheter placed as above -- Continues on IV Lasix as above -- BMP daily  Pulmonary embolism -- Eliquis  CAD s/p CABG Denies chest pain  History of essential hypertension Currently not on antitenses outpatient. -- Continue to monitor BP closely  GERD:  --Protonix  HLD -- Crestor  Dementia -- Continue fluoxetine -- Palliative care consulted for assistance with goals of care/medical decision making, family desires continued aggressive measures, full code at this time.  Sacral pressure injury, stage I Pressure Injury 04/28/22 Sacrum Mid Stage 1 -  Intact skin with non-blanchable redness of a localized area usually over a bony prominence. (Active)  04/28/22 2030  Location: Sacrum  Location Orientation: Mid  Staging: Stage 1 -  Intact skin with  non-blanchable redness of a localized area usually over a bony prominence.  Wound Description (Comments):   Present on Admission: Yes  -- Zinc oxide -- Wound care following, continue frequent offloading, local wound care  Weakness/debility/gait disturbance/deconditioning: Currently lives at home with 24/7 care by family. -- PT/OT evaluation  DVT prophylaxis:  apixaban (ELIQUIS) tablet 2.5 mg Start: 05/12/22 1000 apixaban (ELIQUIS) tablet 2.5 mg    Code Status: Full Code Family Communication: Updated son present at bedside  Disposition Plan:  Level of care: Telemetry Cardiac Status is: Observation The patient remains OBS appropriate and will d/c before 2 midnights.    Consultants:  Palliative care  Procedures:  Foley catheter placement  Antimicrobials:  None   Subjective: Patient seen and examined at bedside, resting comfortably.  Son present.  Patient pleasantly confused.  No specific complaints at this time.  States he is "hungry".  Remains on IV Lasix.  Foley catheter noted with good urine output.  Not requiring oxygen.  Son concerned about his sacral pressure injury.  Unable to obtain any further ROS due to his underlying mental status.  No acute events overnight per nursing staff.  Objective: Vitals:   05/12/22 0336 05/12/22 0445 05/12/22 0451 05/12/22 0814  BP: (!) 98/52 120/60  (!) 97/50  Pulse: 72 68  73  Resp: (!) 22 20  18   Temp: 98.1 F (36.7 C) 97.7 F (36.5 C)  98.1 F (36.7 C)  TempSrc: Oral Oral  Oral  SpO2: 93% 97%  96%  Weight:   65.9 kg   Height:        Intake/Output Summary (Last 24 hours) at 05/12/2022 1103 Last data filed at 05/12/2022 0600 Gross per 24 hour  Intake --  Output 1225 ml  Net -1225 ml   Filed Weights   05/11/22 2222 05/12/22 0451  Weight: 68.9 kg 65.9 kg    Examination:  Physical Exam: GEN: NAD, alert, pleasantly confused, chronically ill in appearance HEENT: NCAT, PERRL, EOMI, sclera clear, MMM PULM: Slight diminished bilateral bases, mild crackles, no wheezing, normal [place]ire effort without accessory muscle use, on room air CV: RRR w/o M/G/R GI: abd soft, NTND, NABS, no R/G/M MSK: no peripheral edema, moves all extremities independently NEURO: No focal deficits noted PSYCH: normal mood/affect Integumentary: Stage I sacral lesion noted as depicted below, otherwise no other  concerning rashes/lesions/wounds noted on exposed skin surfaces     Data Reviewed: I have personally reviewed following labs and imaging studies  CBC: Recent Labs  Lab 05/10/22 1616 05/11/22 2301 05/12/22 0529  WBC 4.7 4.0 4.5  NEUTROABS 3,064 2.1  --   HGB 9.7* 9.0* 9.0*  HCT 29.5* 28.6* 27.4*  MCV 90.2 94.7 91.9  PLT 130* 106* 916*   Basic Metabolic Panel: Recent Labs  Lab 05/10/22 1616 05/11/22 2301 05/12/22 0523 05/12/22 0529  NA 134* 130*  --  131*  K 3.9 3.7  --  2.9*  CL 100 98  --  97*  CO2 23 20*  --  22  GLUCOSE 86 98  --  101*  BUN 31* 31*  --  29*  CREATININE 1.50* 1.64*  --  1.53*  CALCIUM 8.7 8.3*  --  8.2*  MG  --   --  1.9  --    GFR: Estimated Creatinine Clearance: 24.8 mL/min (A) (by C-G formula based on SCr of 1.53 mg/dL (H)). Liver Function Tests: Recent Labs  Lab 05/10/22 1616 05/11/22 2301  AST 17 27  ALT 8* 11  ALKPHOS  --  78  BILITOT 0.7 0.4  PROT 7.2 6.8  ALBUMIN  --  3.3*   No results for input(s): "LIPASE", "AMYLASE" in the last 168 hours. No results for input(s): "AMMONIA" in the last 168 hours. Coagulation Profile: No results for input(s): "INR", "PROTIME" in the last 168 hours. Cardiac Enzymes: No results for input(s): "CKTOTAL", "CKMB", "CKMBINDEX", "TROPONINI" in the last 168 hours. BNP (last 3 results) Recent Labs    06/25/21 0911  PROBNP 3,088*   HbA1C: No results for input(s): "HGBA1C" in the last 72 hours. CBG: No results for input(s): "GLUCAP" in the last 168 hours. Lipid Profile: No results for input(s): "CHOL", "HDL", "LDLCALC", "TRIG", "CHOLHDL", "LDLDIRECT" in the last 72 hours. Thyroid Function Tests: No results for input(s): "TSH", "T4TOTAL", "FREET4", "T3FREE", "THYROIDAB" in the last 72 hours. Anemia Panel: No results for input(s): "VITAMINB12", "FOLATE", "FERRITIN", "TIBC", "IRON", "RETICCTPCT" in the last 72 hours. Sepsis Labs: No results for input(s): "PROCALCITON", "LATICACIDVEN" in the last 168  hours.  Recent Results (from the past 240 hour(s))  Resp panel by RT-PCR (RSV, Flu A&B, Covid) Anterior Nasal Swab     Status: Abnormal   Collection Time: 05/12/22  2:06 AM   Specimen: Anterior Nasal Swab  Result Value Ref Range Status   SARS Coronavirus 2 by RT PCR NEGATIVE NEGATIVE Final   Influenza A by PCR NEGATIVE NEGATIVE Final   Influenza B by PCR NEGATIVE NEGATIVE Final    Comment: (NOTE) The Xpert Xpress SARS-CoV-2/FLU/RSV plus assay is intended as an aid in the diagnosis of influenza from Nasopharyngeal swab specimens and should not be used as a sole basis for treatment. Nasal washings and aspirates are unacceptable for Xpert Xpress SARS-CoV-2/FLU/RSV testing.  Fact Sheet for Patients: EntrepreneurPulse.com.au  Fact Sheet for Healthcare Providers: IncredibleEmployment.be  This test is not yet approved or cleared by the [place] FDA and has been authorized for detection and/or diagnosis of SARS-CoV-2 by FDA under an Emergency Use Authorization (EUA). This EUA will remain in effect (meaning this test can be used) for the duration of the COVID-19 declaration under Section 564(b)(1) of the Act, 21 U.S.C. section 360bbb-3(b)(1), unless the authorization is terminated or revoked.     Resp Syncytial Virus by PCR POSITIVE (A) NEGATIVE Final    Comment: (NOTE) Fact Sheet for Patients: EntrepreneurPulse.com.au  Fact Sheet for Healthcare Providers: IncredibleEmployment.be  This test is not yet approved or cleared by the [place] FDA and has been authorized for detection and/or diagnosis of SARS-CoV-2 by FDA under an Emergency Use Authorization (EUA). This EUA will remain in effect (meaning this test can be used) for the duration of the COVID-19 declaration under Section 564(b)(1) of the Act, 21 U.S.C. section 360bbb-3(b)(1), unless the authorization is terminated or revoked.  Performed at  [facility], [place] [address] [place], [place] [address]   Respiratory (~20 pathogens) panel by PCR     Status: Abnormal   Collection Time: 05/12/22  2:06 AM   Specimen: Nasopharyngeal Swab; Respiratory  Result Value Ref Range Status   Adenovirus NOT DETECTED NOT DETECTED Final   Coronavirus 229E NOT DETECTED NOT DETECTED Final    Comment: (NOTE) The Coronavirus on the Respiratory Panel, DOES NOT test for the novel  Coronavirus (2019 nCoV)    Coronavirus HKU1 NOT DETECTED NOT DETECTED Final   Coronavirus NL63 NOT DETECTED NOT DETECTED Final   Coronavirus OC43 NOT DETECTED NOT DETECTED Final   Metapneumovirus NOT DETECTED NOT DETECTED Final   Rhinovirus / Enterovirus NOT DETECTED  NOT DETECTED Final   Influenza A NOT DETECTED NOT DETECTED Final   Influenza B NOT DETECTED NOT DETECTED Final   Parainfluenza Virus 1 NOT DETECTED NOT DETECTED Final   Parainfluenza Virus 2 NOT DETECTED NOT DETECTED Final   Parainfluenza Virus 3 NOT DETECTED NOT DETECTED Final   Parainfluenza Virus 4 NOT DETECTED NOT DETECTED Final   Respiratory Syncytial Virus DETECTED (A) NOT DETECTED Final   Bordetella pertussis NOT DETECTED NOT DETECTED Final   Bordetella Parapertussis NOT DETECTED NOT DETECTED Final   Chlamydophila pneumoniae NOT DETECTED NOT DETECTED Final   Mycoplasma pneumoniae NOT DETECTED NOT DETECTED Final    Comment: Performed at [facility], [place] [address], [place], [place] [address]         Radiology Studies: DG Chest Port 1 View  Result Date: 05/11/2022 CLINICAL DATA:  Shortness of breath edema EXAM: PORTABLE CHEST 1 VIEW COMPARISON:  05/01/2022 FINDINGS: Post sternotomy changes. Small right-sided pleural effusion. Cardiomegaly with vascular congestion versus bronchitic change. Aortic atherosclerosis. IMPRESSION: Small right pleural effusion. Cardiomegaly with vascular congestion versus bronchitic change. Electronically Signed   By: Donavan Foil M.D.   On: 05/11/2022 22:44         Scheduled Meds:  apixaban  2.5 mg Oral BID   docusate sodium  100 mg Oral BID   finasteride  5 mg Oral Daily   FLUoxetine  20 mg Oral Daily   furosemide  40 mg Intravenous BID   pantoprazole  40 mg Oral Daily   potassium chloride  30 mEq Oral Q3H   rosuvastatin  5 mg Oral Daily   sodium chloride flush  3 mL Intravenous Q12H   tamsulosin  0.4 mg Oral Q supper   Continuous Infusions:   LOS: 0 days    Time spent: 56 minutes spent on chart review, discussion with nursing staff, consultants, updating family and interview/physical exam; more than 50% of that time was spent in counseling and/or coordination of care.    Sherlyne Crownover J [place] DO [facility] Available via Epic secure chat 7am-7pm After these hours, please refer to coverage provider listed on [email or web address] 05/12/2022, 11:03 AM

## 2022-05-12 NOTE — Consult Note (Addendum)
[facility] Nurse Consult Note: Reason for Consult: buttocks pressure ulcer  Wound type: Moisture Associated Skin Damage  Pressure Injury POA: NA, area of partial thickness skin loss related to moisture not pressure  Measurement: scattered areas of partial thickness skin loss over bilateral buttocks, largest measuring 1 cm x 1 cm  Wound bed: pink moist  Drainage (amount, consistency, odor) none Periwound: erythematous with peeling skin  Dressing procedure/placement/frequency: Please use nursing skin care order set to protect buttocks, use moisture barrier cream twice daily and as needed after each episode of incontinence.  May place a piece of Xeroform gauze Kellie Simmering [id number]) to areas of partial thickness skin loss daily.  Cover with sacral foam, change foam q3 days and prn soiling.   ICD-10 CM Codes for Irritant Dermatitis L24A2 - Due to fecal, urinary or dual incontinence  POC discussed with bedside nurse.  WOC will not follow this patient at this time, re-consult if further needs arise.   Thank you,    Nabria Nevin MSN, RN-BC, [employer]

## 2022-05-12 NOTE — Consult Note (Signed)
Palliative Medicine Inpatient Consult Note  Consulting Provider: Dr. Lorin Mercy  Reason for consult:   Evan [name] Palliative Medicine Consult  Reason for Consult? goals of care   05/12/2022  HPI:  Per intake H&P --> Evan [name] is a 89 y.o. male with medical history significant of lung cancer 07/09/15), BPH, CAD s/p CABG, HTN, HLD, RA, dementia; and VTE on Eliquis presenting with SOB.  He was last hospitalized from 1/24-30 with acute on chronic systolic CHF and recurrent acute PE, restarted on Eliquis per hematology recommendation.   Patient being treated for HF exacerbation, urinary retention, and RSV.  Palliative care has been asked to get involved to further address goals of care, Evan [name] has had 3 IP admissions in the past 6 months. He was formerly followed as an OP by Evan [name]. Prior DNR in 07/09/18.    Clinical Assessment/Goals of Care:  *Please note that this is a verbal dictation therefore any spelling or grammatical errors are due to the "Evan [name]" system interpretation.  I have reviewed medical records including EPIC notes, labs and imaging, received report from bedside RN, assessed the patient who is lying in bed in no acute distress at this time.   I met with patient's son, Evan [name] to further discuss diagnosis prognosis, Evan [name], EOL wishes, disposition and options.   I introduced Palliative Medicine as specialized medical care for people living with serious illness. It focuses on providing relief from the symptoms and stress of a serious illness. The goal is to improve quality of life for both the patient and the family.  Medical History Review and Understanding:  Evan [name] has a past medical history of heart failure, lung cancer, coronary artery disease, hypertension, rheumatoid arthritis, dementia, and pulmonary embolism.  Evan [name] and I discussed that Evan [name] has been hospitalized multiple times in the past months due to his heart failure.  Social History:  Evan [name]  lives in [place].  His wife passed away in 07-09-2014.  He had 5 children though [name] of his daughters died in the past few years.  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  He is a man of faith and practices within Christianity.  Functional and Nutritional State:  Patient lives in a single-family home.  3 out of his 4 children take shifts to care for him daily.  He requires help with all B ADLs though he is able to self-feed.  His son Evan [name] shares he can walk with a walker if there is someone behind him with a wheelchair it is a two-person assist of effort.  Advance Directives:  A detailed discussion was had today regarding advanced directives.  Yes and these can be identified in Evan [name].  Code Status:  Concepts specific to code status, artifical feeding and hydration, continued IV antibiotics and rehospitalization was had.  The difference between a aggressive medical intervention path and a palliative comfort care path for this patient at this time was had.   Patient's son shares that in the past Shepherd had been vocal about the desire for full resuscitative efforts.  Family would like for at least a trial of these efforts and if patient was not able to recover to the point where he had meaningful quality of life it would not be consistent with Evan [name]'s wishes.  I did share the possible trauma and burdensome effects of a full cardiopulmonary resuscitation event.  Reviewed the poor outcomes for patients who suffer  from dementia as well as multiple chronic comorbid conditions when resuscitated.  Patient's son is aware of these.  Discussion:  Discussed patient's reason for admission inclusive of heart failure exacerbation, RSV and urinary retention.   Patient's son vocalizes that he had spoken with the medical team who plan to "try to dry him out" from the perspective of his volume overload.  Reviewed that patient will have a Foley catheter  in place at this time to both monitor output as well as to help with retention.  Patient's son vocalized that his goals are to get Evan [name] home to continue with [facility] services to get Evan [name] closer to his functional baseline.  Evan [name] is open to outpatient palliative support upon discharge.  He feels the more help the better.  Discussed the importance of continued conversation with family and their  medical providers regarding overall plan of care and treatment options, ensuring decisions are within the context of the patients values and GOCs.  Decision Maker: Evan [name] Son [phone]  SUMMARY OF RECOMMENDATIONS   FULL CODE/FULL SCOPE OF CARE  Open and honest conversations held in the setting of patients disease process  Goals are to diurese and regain physical function  OP Palliative support on discharge  Ongoing PMT support  Code Status/Advance Care Planning: FULL CODE  Palliative Prophylaxis:  Aspiration, Bowel Regimen, Delirium Protocol, Frequent Pain Assessment, Oral Care, Palliative Wound Care, and Turn Reposition  Additional Recommendations (Limitations, Scope, Preferences): Continue current care  Psycho-social/Spiritual:  Desire for further Chaplaincy support: Not presently Additional Recommendations: Review of acute on chronic disease burden   Prognosis: Hard to determine though high disease burden with recurrent re-hospitalizations placing patient at a higher 12 month mortality risk  Discharge Planning: Discharge per primary once medically optimized.  Vitals:   05/12/22 0336 05/12/22 0445  BP: (!) 98/52 120/60  Pulse: 72 68  Resp: (!) 22 20  Temp: 98.1 F (36.7 C) 97.7 F (36.5 C)  SpO2: 93% 97%    Intake/Output Summary (Last 24 hours) at 05/12/2022 0754 Last data filed at 05/12/2022 0600 Gross per 24 hour  Intake --  Output 1225 ml  Net -1225 ml   Last Weight  Most recent update: 05/12/2022  4:52 AM    Weight  65.9 kg (145 lb 4.5 oz)             Gen:  Elderly Caucasian M in NAD HEENT: moist mucous membranes CV: Regular rate and rhythm  PULM:  On RA, breathing is even and nonlabored ABD: soft/nontender  EXT: No edema  Neuro: Alert to self  PPS: 30-40%   This conversation/these recommendations were discussed with patient primary care team, Dr. [place]  Billing based on MDM: High  Problems Addressed: [name] acute or chronic illness or injury that poses a threat to life or bodily function  Amount and/or Complexity of Data: Category 3:Discussion of management or test interpretation with external physician/other qualified health care professional/appropriate source (not separately reported)  Risks: Decision regarding hospitalization or escalation of hospital care and Decision not to resuscitate or to de-escalate care because of poor prognosis ______________________________________________________ [facility] Team Team Cell Phone: [phone] Please utilize secure chat with additional questions, if there is no response within 30 minutes please call the above phone number  Palliative Medicine Team providers are available by phone from 7am to 7pm daily and can be reached through the team cell phone.  Should this patient require assistance outside of these  hours, please call the patient's attending physician.

## 2022-05-12 NOTE — ED Notes (Signed)
ED TO INPATIENT HANDOFF REPORT  ED Nurse Name and Phone #: Marye Round RN [phone]  S Name/Age/Gender Evan [name] 89 y.o. male Room/Bed: 034C/034C  Code Status   Code Status: Full Code  Home/SNF/Other Home Patient oriented to: self, place, time, and situation Is this baseline? Yes   Triage Complete: Triage complete  Chief Complaint Acute on chronic systolic CHF (congestive heart failure) (HCC) [I50.23]  Triage Note Pt coming from home, doubled lasix per Dr order and dr said if he doesn't produce a decent amount of urine to call 911...  Sugar 191 Hx of afib and right sided PE 122/45 HR 75 96% room air   Allergies Allergies  Allergen Reactions   Feldene [Piroxicam] Rash    Blistering rash   Neomycin-Polymyxin-Hc Itching   Statins Other (See Comments)    Myalgias   Valium [Diazepam] Other (See Comments)    Dizziness    Latex Rash   Tequin [Gatifloxacin] Anxiety   Vibra-Tab [Doxycycline] Nausea Only    Level of Care/Admitting Diagnosis ED Disposition     ED Disposition  Admit   Condition  --   Comment  Hospital Area: [facility] [[id number]]  Level of Care: Telemetry Cardiac [[id number]]  May place patient in observation at [facility] or [place] if equivalent level of care is available:: No  Covid Evaluation: Symptomatic Person Under Investigation (PUI) or recent exposure (last 10 days) *Testing Required*  Diagnosis: Acute on chronic systolic CHF (congestive heart failure) [facility]) [[id number]]  Admitting Physician: [name] [[id number]]  Attending Physician: [name] [[id number]]          B Medical/Surgery History Past Medical History:  Diagnosis Date   Adenocarcinoma of right lung ([place]) 01/06/2016   Anginal chest pain at rest    Chronic non, controlled on Lorazepam   Anxiety    Arthritis    "all over"    BPH (benign prostatic hyperplasia)    CAD (coronary artery disease)    Chronic lower back pain    Complication of anesthesia     "problems making water afterwards"   Depression    DJD (degenerative joint disease)    Esophageal ulcer without bleeding    bid ppi indefinitely   Family history of adverse reaction to anesthesia    "children w/PONV"   GERD (gastroesophageal reflux disease)    Headache    "couple/week maybe" (09/04/2015)   Hemochromatosis    Possible, elevated iron stores, hook-like osteophytes on hand films, normal LFTs   Hepatitis    "yellow jaundice as a baby"   History of ASCVD    MULTIVESSEL   Hyperlipidemia    Hypertension    Mild aortic stenosis 06/23/2021   Osteoarthritis    Pneumonia    "several times; got a little now" (09/04/2015)   Rheumatoid arthritis ([place])    Subdural hematoma ([place])    small after fall 09/2016-plavix held, neurosurgery consulted.  Asymptomatic.     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 since he failed PO Eliquis   Past Surgical History:  Procedure Laterality Date   ARM SKIN LESION BIOPSY / EXCISION Left 09/02/2015   CATARACT EXTRACTION W/ INTRAOCULAR LENS  IMPLANT, BILATERAL Bilateral    CHOLECYSTECTOMY OPEN     CORNEAL TRANSPLANT Bilateral    "one at [facility]; one at [facility]"   [place]     [place]  stent ostium into the right radial free graft at OM-, 02-2007   ESOPHAGOGASTRODUODENOSCOPY N/A 11/09/2014   Procedure: ESOPHAGOGASTRODUODENOSCOPY (EGD);  Surgeon: [name], MD;  Location: [facility] ENDOSCOPY;  Service: Endoscopy;  Laterality: N/A;   INGUINAL HERNIA REPAIR     JOINT REPLACEMENT     LEFT HEART CATH AND CORS/GRAFTS ANGIOGRAPHY N/A 06/27/2017   Procedure: LEFT HEART CATH AND CORS/GRAFTS ANGIOGRAPHY;  Surgeon: [name], MD;  Location: [facility] LAB;  Service: Cardiovascular;  Laterality: N/A;   NASAL SINUS SURGERY     SHOULDER OPEN ROTATOR CUFF REPAIR Right    TOTAL KNEE ARTHROPLASTY Bilateral      A IV  Location/Drains/Wounds Patient Lines/Drains/Airways Status     Active Line/Drains/Airways     Name Placement date Placement time Site Days   Peripheral IV 05/11/22 20 G Anterior;Left Forearm 05/11/22  2223  Forearm  1   Urethral Catheter Caryl Pina towsley RN 05/12/22  0258  --  less than 1   Pressure Injury 08/26/18 Stage II -  Partial thickness loss of dermis presenting as a shallow open ulcer with a red, pink wound bed without slough. small circular wounds on both buttocks, 2 on right, 1 on left 08/26/18  2020  -- 1355   Pressure Injury 03/12/22 Coccyx Mid Stage 1 -  Intact skin with non-blanchable redness of a localized area usually over a bony prominence. 03/12/22  1800  -- 61   Pressure Injury 04/28/22 Sacrum Mid Stage 1 -  Intact skin with non-blanchable redness of a localized area usually over a bony prominence. 04/28/22  2030  -- 14   Wound / Incision (Open or Dehisced) 08/12/18 Other (Comment) Heel Posterior;Right Deep Tissue Injurry 08/12/18  0930  Heel  1369   Wound / Incision (Open or Dehisced) 08/12/18 Other (Comment) Ear Right Device Associated Pressure Ulcer  08/12/18  1640  Ear  1369            Intake/Output Last 24 hours No intake or output data in the 24 hours ending 05/12/22 0340  Labs/Imaging Results for orders placed or performed during the hospital encounter of 05/11/22 (from the past 48 hour(s))  CBC with Differential     Status: Abnormal   Collection Time: 05/11/22 11:01 PM  Result Value Ref Range   WBC 4.0 4.0 - 10.5 K/uL   RBC 3.02 (L) 4.22 - 5.81 MIL/uL   Hemoglobin 9.0 (L) 13.0 - 17.0 g/dL   HCT 28.6 (L) 39.0 - 52.0 %   MCV 94.7 80.0 - 100.0 fL   MCH 29.8 26.0 - 34.0 pg   MCHC 31.5 30.0 - 36.0 g/dL   RDW 15.9 (H) 11.5 - 15.5 %   Platelets 106 (L) 150 - 400 K/uL    Comment: REPEATED TO VERIFY   nRBC 0.0 0.0 - 0.2 %   Neutrophils Relative % 52 %   Neutro Abs 2.1 1.7 - 7.7 K/uL   Lymphocytes Relative 30 %   Lymphs Abs 1.2 0.7 - 4.0 K/uL   Monocytes  Relative 15 %   Monocytes Absolute 0.6 0.1 - 1.0 K/uL   Eosinophils Relative 1 %   Eosinophils Absolute 0.0 0.0 - 0.5 K/uL   Basophils Relative 1 %   Basophils Absolute 0.0 0.0 - 0.1 K/uL   WBC Morphology MORPHOLOGY UNREMARKABLE    RBC Morphology MORPHOLOGY UNREMARKABLE    Smear Review Normal platelet morphology    Immature Granulocytes 1 %   Abs Immature Granulocytes 0.05 0.00 - 0.07 K/uL  Comment: Performed at [facility], [place] [address] [place], [place] [address]  Comprehensive metabolic panel     Status: Abnormal   Collection Time: 05/11/22 11:01 PM  Result Value Ref Range   Sodium 130 (L) 135 - 145 mmol/L   Potassium 3.7 3.5 - 5.1 mmol/L   Chloride 98 98 - 111 mmol/L   CO2 20 (L) 22 - 32 mmol/L   Glucose, Bld 98 70 - 99 mg/dL    Comment: Glucose reference range applies only to samples taken after fasting for at least 8 hours.   BUN 31 (H) 8 - 23 mg/dL   Creatinine, Ser 1.64 (H) 0.61 - 1.24 mg/dL   Calcium 8.3 (L) 8.9 - 10.3 mg/dL   Total Protein 6.8 6.5 - 8.1 g/dL   Albumin 3.3 (L) 3.5 - 5.0 g/dL   AST 27 15 - 41 U/L   ALT 11 0 - 44 U/L   Alkaline Phosphatase 78 38 - 126 U/L   Total Bilirubin 0.4 0.3 - 1.2 mg/dL   GFR, Estimated 39 (L) >60 mL/min    Comment: (NOTE) Calculated using the CKD-EPI Creatinine Equation (2021)    Anion gap 12 5 - 15    Comment: Performed at [facility], [place] [address], [place], [place] [address]  Brain natriuretic peptide     Status: Abnormal   Collection Time: 05/11/22 11:01 PM  Result Value Ref Range   B Natriuretic Peptide 169.5 (H) 0.0 - 100.0 pg/mL    Comment: Performed at [place] [address], [place], [place] [address]  Troponin I (High Sensitivity)     Status: Abnormal   Collection Time: 05/11/22 11:01 PM  Result Value Ref Range   Troponin I (High Sensitivity) 23 (H) <18 ng/L    Comment: (NOTE) Elevated high sensitivity troponin I (hsTnI) values and significant  changes across serial measurements may  suggest ACS but many other  chronic and acute conditions are known to elevate hsTnI results.  Refer to the "Links" section for chest pain algorithms and additional  guidance. Performed at [facility], [place] [address], [place], [place] [address]   Troponin I (High Sensitivity)     Status: Abnormal   Collection Time: 05/12/22  1:43 AM  Result Value Ref Range   Troponin I (High Sensitivity) 26 (H) <18 ng/L    Comment: (NOTE) Elevated high sensitivity troponin I (hsTnI) values and significant  changes across serial measurements may suggest ACS but many other  chronic and acute conditions are known to elevate hsTnI results.  Refer to the "Links" section for chest pain algorithms and additional  guidance. Performed at [facility], [place] [address], [place], [place] [address]   Resp panel by RT-PCR (RSV, Flu A&B, Covid) Anterior Nasal Swab     Status: Abnormal   Collection Time: 05/12/22  2:06 AM   Specimen: Anterior Nasal Swab  Result Value Ref Range   SARS Coronavirus 2 by RT PCR NEGATIVE NEGATIVE   Influenza A by PCR NEGATIVE NEGATIVE   Influenza B by PCR NEGATIVE NEGATIVE    Comment: (NOTE) The Xpert Xpress SARS-CoV-2/FLU/RSV plus assay is intended as an aid in the diagnosis of influenza from Nasopharyngeal swab specimens and should not be used as a sole basis for treatment. Nasal washings and aspirates are unacceptable for Xpert Xpress SARS-CoV-2/FLU/RSV testing.  Fact Sheet for Patients: EntrepreneurPulse.com.au  Fact Sheet for Healthcare Providers: IncredibleEmployment.be  This test is not yet approved  or cleared by the [place] and has been authorized for detection and/or diagnosis of SARS-CoV-2 by FDA under an Emergency Use Authorization (EUA). This EUA will remain in effect (meaning this test can be used) for the duration of the COVID-19 declaration under Section 564(b)(1) of the Act, 21 U.S.C. section  360bbb-3(b)(1), unless the authorization is terminated or revoked.     Resp Syncytial Virus by PCR POSITIVE (A) NEGATIVE    Comment: (NOTE) Fact Sheet for Patients: EntrepreneurPulse.com.au  Fact Sheet for Healthcare Providers: IncredibleEmployment.be  This test is not yet approved or cleared by the [place] FDA and has been authorized for detection and/or diagnosis of SARS-CoV-2 by FDA under an Emergency Use Authorization (EUA). This EUA will remain in effect (meaning this test can be used) for the duration of the COVID-19 declaration under Section 564(b)(1) of the Act, 21 U.S.C. section 360bbb-3(b)(1), unless the authorization is terminated or revoked.  Performed at [facility], [place] [address], [place], [place] [address]    *Note: Due to a large number of results and/or encounters for the requested time period, some results have not been displayed. A complete set of results can be found in Results Review.   DG Chest Port 1 View  Result Date: 05/11/2022 CLINICAL DATA:  Shortness of breath edema EXAM: PORTABLE CHEST 1 VIEW COMPARISON:  05/01/2022 FINDINGS: Post sternotomy changes. Small right-sided pleural effusion. Cardiomegaly with vascular congestion versus bronchitic change. Aortic atherosclerosis. IMPRESSION: Small right pleural effusion. Cardiomegaly with vascular congestion versus bronchitic change. Electronically Signed   By: Donavan Foil M.D.   On: 05/11/2022 22:44    Pending Labs Unresulted Labs (From admission, onward)     Start     Ordered   05/12/22 8182  Basic metabolic panel  Tomorrow morning,   R        05/12/22 0328   05/12/22 0500  CBC  Tomorrow morning,   R        05/12/22 0328   05/11/22 2230  Respiratory (~20 pathogens) panel by PCR  (Respiratory panel by PCR (~20 pathogens, ~24 hr TAT)  w precautions)  Once,   URGENT        05/11/22 2230            Vitals/Pain Today's Vitals   05/12/22 0200 05/12/22  0230 05/12/22 0300 05/12/22 0336  BP: (!) 110/52 (!) 128/53 (!) 111/48 (!) 98/52  Pulse: 71 68 70 72  Resp: (!) 27 (!) 30 (!) 27 (!) 22  Temp:  98.5 F (36.9 C)  98.1 F (36.7 C)  TempSrc:  Oral  Oral  SpO2: 95% 94% 97% 93%  Weight:      Height:        Isolation Precautions No active isolations  Medications Medications  rosuvastatin (CRESTOR) tablet 5 mg (has no administration in time range)  FLUoxetine (PROZAC) capsule 20 mg (has no administration in time range)  pantoprazole (PROTONIX) EC tablet 40 mg (has no administration in time range)  finasteride (PROSCAR) tablet 5 mg (has no administration in time range)  tamsulosin (FLOMAX) capsule 0.4 mg (has no administration in time range)  apixaban (ELIQUIS) tablet 2.5 mg (has no administration in time range)  Lifitegrast 5 % SOLN 1 drop (has no administration in time range)  Zinc Oxide (TRIPLE PASTE) 12.8 % ointment (has no administration in time range)  furosemide (LASIX) injection 40 mg (has no administration in time range)  sodium chloride flush (NS) 0.9 % injection 3 mL (3  mLs Intravenous Given 05/12/22 0337)  acetaminophen (TYLENOL) tablet 650 mg (has no administration in time range)    Or  acetaminophen (TYLENOL) suppository 650 mg (has no administration in time range)  docusate sodium (COLACE) capsule 100 mg (has no administration in time range)  polyethylene glycol (MIRALAX / GLYCOLAX) packet 17 g (has no administration in time range)  bisacodyl (DULCOLAX) EC tablet 5 mg (has no administration in time range)  hydrALAZINE (APRESOLINE) injection 5 mg (has no administration in time range)  furosemide (LASIX) injection 40 mg (40 mg Intravenous Given 05/12/22 0258)    Mobility walks with device and assistance from son     Focused Assessments   , Pulmonary Assessment Handoff:  Lung sounds: Bilateral Breath Sounds: Diminished, Expiratory wheezes L Breath Sounds: Diminished, Expiratory wheezes R Breath Sounds: Diminished,  Expiratory wheezes O2 Device: Room Air      R Recommendations: See Admitting Provider Note  Report given to:   Additional Notes: pt was retaining fluid which is the initial reason he came in, the nurse prior to me placed a foley d/t retention.

## 2022-05-12 NOTE — ED Notes (Addendum)
Bladder scanner: 780mL

## 2022-05-12 NOTE — Evaluation (Signed)
Occupational Therapy Evaluation Patient Details Name: Evan [name] MRN: [id number] DOB: [birth year] Today's Date: 05/12/2022   History of Present Illness 89 yo male presents to [facility] 2/7 with ShOB and decreased urine output, +RSV and HF exacerbation. Recent hospitalization 1/24-1/30 with newly diagnosed PE and CHF exacerbation. 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   Clinical Impression   PTA, pt has family rotating to provide 24/7 assist at home. Pt typically ambulatory with B platform walker and +2 assist for safety. Pt also has assist for all bathing, dressing and toileting. Pt presents with baseline R sided deficits from prior CVA and new deficits in endurance. Overall, pt requires Mod A x 2 for mobility via handheld assist and close to baseline for ADLs. Family present and interested in continuation of [place] therapies at DC. Will follow acutely to maximize endurance and pt/family safety with ADL routine.  VSS on RA     Recommendations for follow up therapy are one component of a multi-disciplinary discharge planning process, led by the attending physician.  Recommendations may be updated based on patient status, additional functional criteria and insurance authorization.   Follow Up Recommendations  Home health OT     Assistance Recommended at Discharge Frequent or constant Supervision/Assistance  Patient can return home with the following A lot of help with walking and/or transfers;A lot of help with bathing/dressing/bathroom;Assistance with cooking/housework;Direct supervision/assist for medications management;Direct supervision/assist for financial management;Assist for transportation;Help with stairs or ramp for entrance    Functional Status  Assessment  Patient has had a recent decline in their functional status and demonstrates the ability to make significant improvements in function in a reasonable and predictable amount of time.  Equipment Recommendations  None recommended by OT    Recommendations for Other Services       Precautions / Restrictions Precautions Precautions: Fall Precaution Comments: residual R hemiparesis Restrictions Weight Bearing Restrictions: No      Mobility Bed Mobility Overal bed mobility: Needs Assistance Bed Mobility: Supine to Sit   Sidelying to sit: Mod assist, +2 for safety/equipment       General bed mobility comments: assist for trunk elevation, LE progression, and scooting forward with assist of bed pad    Transfers Overall transfer level: Needs assistance Equipment used: Rolling walker (2 wheels), 2 person hand held assist Transfers: Sit to/from Stand Sit to Stand: Mod assist, +2 physical assistance           General transfer comment: mod +2 for power up, rise, steadying. First attempt with RW, pt with poor RUE placement on RW and heavy posterior bias, improved standing balance with HHA +2      Balance Overall balance assessment: Needs assistance Sitting-balance support: No upper extremity supported, Feet supported Sitting balance-[name] Scale: Fair   Postural control: Posterior lean, Right lateral lean Standing balance support: During functional activity, Bilateral upper extremity supported Standing balance-[name] Scale: Poor                             ADL either performed or assessed with clinical judgement   ADL Overall ADL's : Needs assistance/impaired Eating/Feeding: Sitting;Minimal assistance   Grooming: Minimal assistance;Sitting   Upper Body Bathing: Moderate assistance   Lower Body  Bathing: Maximal assistance;Sit to/from stand;Sitting/lateral leans   Upper Body Dressing : Sitting;Moderate assistance   Lower Body Dressing: Maximal  assistance;Sit to/from stand   Toilet Transfer: Moderate assistance;+2 for safety/equipment;Stand-pivot   Toileting- Clothing Manipulation and Hygiene: Maximal assistance         General ADL Comments: Close to baseline with good family support though new endurance deficits with recent PE and now RSV+     Vision Baseline Vision/History: 1 Wears glasses Ability to See in Adequate Light: 0 Adequate Patient Visual Report: No change from baseline Vision Assessment?: No apparent visual deficits     Perception     Praxis      Pertinent Vitals/Pain Pain Assessment Pain Assessment: No/denies pain     Hand Dominance Right   Extremity/Trunk Assessment Upper Extremity Assessment Upper Extremity Assessment: RUE deficits/detail RUE Deficits / Details: uncoordinated movement, RUE shoulder flex ~90*, can flex/ext elbow, 2/5 grasp; tendency to rest in flexed position but able to stretch functionally RUE Coordination: decreased fine motor;decreased gross motor   Lower Extremity Assessment Lower Extremity Assessment: Defer to PT evaluation RLE Deficits / Details: incoordination with poor motor grading, at least 3/5 symmetrically BLE   Cervical / Trunk Assessment Cervical / Trunk Assessment: Kyphotic   Communication Communication Communication: HOH   Cognition Arousal/Alertness: Awake/alert Behavior During Therapy: WFL for tasks assessed/performed Overall Cognitive Status: Impaired/Different from baseline Area of Impairment: Following commands, Safety/judgement, Problem solving                       Following Commands: Follows one step commands with increased time, Follows one step commands consistently Safety/Judgement: Decreased awareness of safety   Problem Solving: Difficulty sequencing, Requires verbal cues, Requires tactile cues, Decreased initiation, Slow processing       General Comments  VSS on RA. Son and daughter present    Exercises     Shoulder  Instructions      Home Living Family/patient expects to be discharged to:: Private residence Living Arrangements: Alone Available Help at Discharge: Family;Available 24 hours/day Type of Home: House Home Access: Ramped entrance     Home Layout: Two level;Able to live on main level with bedroom/bathroom     Bathroom Shower/Tub: Hospital doctor Toilet: Handicapped height Bathroom Accessibility: No   Home Equipment: Conservation officer, nature (2 wheels);Hospital bed;Wheelchair - manual;BSC/3in1;Shower seat          Prior Functioning/Environment Prior Level of Function : Needs assist       Physical Assist : ADLs (physical);Mobility (physical) Mobility (physical): Transfers;Gait;Bed mobility ADLs (physical): Bathing;Dressing;Toileting;IADLs Mobility Comments: walks with bilat platform RW and gait belt typically, per children pt has one person in front and one person behind him when he walks and he has not been doing as much walking since last hospitalization ADLs Comments: assist for all ADLs but typically able to self feed        OT Problem List: Decreased strength;Decreased activity tolerance;Decreased range of motion;Impaired balance (sitting and/or standing);Decreased cognition;Decreased safety awareness;Cardiopulmonary status limiting activity;Impaired UE functional use      OT Treatment/Interventions: Self-care/ADL training;Therapeutic exercise;Energy conservation;DME and/or AE instruction;Therapeutic activities;Patient/family education;Balance training;Cognitive remediation/compensation    OT Goals(Current goals can be found in the care plan section) Acute Rehab OT Goals Patient Stated Goal: improve endurance OT Goal Formulation: With patient Time For Goal Achievement: 05/26/22 Potential to Achieve Goals: Good  OT Frequency: Min 2X/week    Co-evaluation PT/OT/SLP Co-Evaluation/Treatment: Yes Reason for Co-Treatment: For patient/therapist safety;To address  functional/ADL  transfers PT goals addressed during session: Mobility/safety with mobility;Balance        AM-PAC OT "6 Clicks" Daily Activity     Outcome Measure Help from another person eating meals?: A Little Help from another person taking care of personal grooming?: A Little Help from another person toileting, which includes using toliet, bedpan, or urinal?: A Lot Help from another person bathing (including washing, rinsing, drying)?: A Lot Help from another person to put on and taking off regular upper body clothing?: A Lot Help from another person to put on and taking off regular lower body clothing?: A Lot 6 Click Score: 14   End of Session Equipment Utilized During Treatment: Gait belt  Activity Tolerance: Patient tolerated treatment well Patient left: in chair;with call bell/phone within reach;with chair alarm set;with family/visitor present  OT Visit Diagnosis: Muscle weakness (generalized) (M62.81);Unsteadiness on feet (R26.81);Other abnormalities of gait and mobility (R26.89)                Time: 0941-1000 OT Time Calculation (min): 19 min Charges:  OT General Charges $OT Visit: 1 Visit OT Evaluation $OT Eval Moderate Complexity: 1 Mod  Malachy Chamber, OTR/L Acute Rehab Services Office: [phone]   Layla Maw 05/12/2022, 11:04 AM

## 2022-05-12 NOTE — TOC Initial Note (Signed)
Transition of Care (TOC) - Initial/Assessment Note  Marvetta Gibbons RN, BSN Transitions of Care Unit 4E- RN Case Manager See Treatment Team for direct phone #   Patient Details  Name: Evan [name] MRN: [id number] Date of Birth: May 14, 1927  Transition of Care [facility]) CM/SW Contact:    Dawayne Patricia, RN Phone Number: 05/12/2022, 3:09 PM  Clinical Narrative:                 Pt from home, has children that assist in the home 24/7. Pt is active with [facility]- RN/PT/OT- call made to liaison- Claiborne Billings and confirmed that they can resume services and add CSW on discharge.  [facility] will follow for transition home and resumption of HH needs.   Per previous CM note- pt has needed DME in the home.   TOC will continue to follow and will monitor patient advancement through interdisciplinary progression rounds. If new patient transition needs arise, please place a TOC consult.  Expected Discharge Plan: [place] Barriers to Discharge: Continued Medical Work up   Patient Goals and CMS Choice Patient states their goals for this hospitalization and ongoing recovery are:: return home CMS Medicare.gov Compare Post Acute Care list provided to:: Patient Choice offered to / list presented to : Patient, Adult Children      Expected Discharge Plan and Services In-house Referral: NA Discharge Planning Services: CM Consult Post Acute Care Choice: Home Health, Resumption of Svcs/PTA Provider Living arrangements for the past 2 months: Single Family Home                 DME Arranged: N/A DME Agency: NA       HH Arranged: RN, Disease Management, PT, OT, Social Work [employer] Agency: [place] Date [place]: 05/12/22 Time [place]: 1509 Representative spoke with at [place]: Claiborne Billings  Prior Living Arrangements/Services Living arrangements for the past 2 months: [place] with:: Self, Adult Children (Children alternate  24/7) Patient language and need for interpreter reviewed:: Yes Do you feel safe going back to the place where you live?: Yes      Need for Family Participation in Patient Care: Yes (Comment) Care giver support system in place?: Yes (comment) Current home services: DME (cane, rolling walker, bedside commode, wheelchair.) Criminal Activity/Legal Involvement Pertinent to Current Situation/Hospitalization: No - Comment as needed  Activities of Daily Living      Permission Sought/Granted Permission sought to share information with : Facility Sport and exercise psychologist, Case Manager, Family Supports Permission granted to share information with : Yes, Verbal Permission Granted     Permission granted to share info w AGENCY: [facility]        Emotional Assessment Appearance:: Appears stated age       Alcohol / Substance Use: Not Applicable Psych Involvement: No (comment)  Admission diagnosis:  AKI (acute kidney injury) ([place]) [N17.9] Pulmonary vascular congestion [R09.89] Acute on chronic systolic CHF (congestive heart failure) ([place]) [I50.23] Patient Active Problem List   Diagnosis Date Noted   Acute on chronic systolic CHF (congestive heart failure) ([place]) 05/12/2022   Acute respiratory failure with hypoxia ([place]) 04/28/2022   Persistent atrial fibrillation ([place]) 04/28/2022   History of pulmonary embolism 04/28/2022   Pneumonia 03/12/2022   Hemiparesis due to old stroke ([place]) 06/25/2021   Mild aortic stenosis 06/23/2021   Rib pain on right side 03/19/2020   Sundowning 07/04/2019   Bilateral hearing loss 04/16/2019   Urinary frequency 03/21/2019  Closed wedge fracture of lumbar vertebra ([place]) 03/14/2019   Chronic right shoulder pain 12/19/2018   Weakness generalized 09/28/2018   Palliative care encounter 09/28/2018   Pressure injury of skin 08/27/2018   COPD (chronic obstructive pulmonary disease) ([place]) 08/26/2018   Altered mental status 08/21/2018   UTI (urinary  tract infection) 08/21/2018   Abnormality of gait 08/20/2018   Prolonged Q-T interval on ECG    Dysphagia, post-stroke    Supplemental oxygen dependent    Fatigue    SOB (shortness of breath)    Hyperglycemia    History of lung cancer    Depression    Stage 3a chronic kidney disease (HCC)    Acute blood loss anemia    Fall 07/16/2018   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   Acute spontaneous 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 ([place]) L thalamic 07/14/2018   Cellulitis of arm, right 06/01/2018   Rash and nonspecific skin eruption 06/01/2018   Chest pain 02/02/2018   Thrombocytopenia ([place]) 02/02/2018   Hypokalemia 02/02/2018   Acute pulmonary embolism ([place]) 02/02/2018   Elevated troponin 02/02/2018   Old MI (myocardial infarction) 06/27/2017   HFmrEF (heart failure with mildly reduced ejection fraction) ([place]) 06/26/2017   Dyspnea 06/21/2017   Acute bronchitis 05/15/2017   Oral candidiasis 05/15/2017   History of pericarditis    Chest pain at rest 12/20/2016   Radiation pneumonitis ([place]) 11/09/2016   Pleurisy 10/11/2016   Chest wall pain 10/11/2016   Subdural hematoma (HCC) 09/13/2016   Permanent atrial fibrillation ([place]) 05/21/2016   Adenocarcinoma of right lung ([place]) 01/06/2016   GAD (generalized anxiety disorder) 11/20/2015   Hypotension    Cellulitis 09/04/2015   Cellulitis of left axilla    Esophageal ulcer without bleeding    Bilateral lower extremity edema 04/28/2014   Coronary artery disease involving native coronary artery of native heart without angina pectoris 09/09/2013   BPH (benign prostatic hyperplasia) 10/08/2012   EKG abnormalities 09/05/2012   Tremor 09/05/2012   Chronic fatigue 09/05/2012   Arthritis    Asthma, chronic    Reflux esophagitis    S/P CABG (coronary artery bypass graft)    GERD (gastroesophageal reflux disease)    Hyperlipidemia LDL goal <70    Essential  hypertension    UNSPECIFIED PERIPHERAL VASCULAR DISEASE 06/16/2009   PCP:  [name], MD Pharmacy:   [facility] - [place], [place] [address] Phone: [phone] Fax: [phone]     Social Determinants of Health (SDOH) Social History: SDOH Screenings   Food Insecurity: No Food Insecurity (04/28/2022)  Housing: Low Risk  (05/04/2022)  Transportation Needs: No Transportation Needs (05/04/2022)  Utilities: Not At Risk (04/28/2022)  Alcohol Screen: Low Risk  (05/07/2021)  Depression (PHQ2-9): Low Risk  (03/25/2022)  Recent Concern: Depression (PHQ2-9) - High Risk (01/05/2022)  Financial Resource Strain: Low Risk  (05/04/2022)  Physical Activity: Insufficiently Active (05/07/2021)  Social Connections: Socially Isolated (05/07/2021)  Stress: No Stress Concern Present (05/07/2021)  Tobacco Use: Medium Risk (05/10/2022)   SDOH Interventions:     Readmission Risk Interventions    04/29/2022    3:21 PM  Readmission Risk Prevention Plan  Transportation Screening Complete  HRI or Home Care Consult Complete  Social Work Consult for [place] Planning/Counseling Complete  Palliative Care Screening Not Applicable  Medication Review Press photographer) Referral to Pharmacy

## 2022-05-12 NOTE — Telephone Encounter (Signed)
Pt's daughter [name], called to let you know that the patient has been admitted to the hospital. He was unable to urinate yesterday on 80 mg of Lasix. Pt also tested + for RSV per [name]. Thank you.

## 2022-05-12 NOTE — Progress Notes (Signed)
Patient arrived at the unit,chg bath given,CCMD notified,vitals taken,patient oriented to the unit,no complaints of pain,will continue to monitor

## 2022-05-12 NOTE — H&P (Addendum)
History and Physical    Patient: Evan [name] PFX:[id number] DOB: [birth year] DOA: 05/11/2022 DOS: the patient was seen and examined on 05/12/2022 PCP: [name], MD  Patient coming from: Home - lives alone but has 24/7 caregivers; NOK: Daughter, Andreas Blower, [phone]   Chief Complaint: SOB  HPI: [name] is a 89 y.o. male with medical history significant of lung cancer (2017), BPH, CAD s/p CABG, HTN, HLD, RA, dementia; and VTE on Eliquis presenting with SOB.  He was last hospitalized from 1/24-30 with acute on chronic systolic CHF and recurrent acute PE, restarted on Eliquis per hematology recommendation.  He was eating well in the hospital but over the last week has had more respiratory congestion/SOB and decreased appetite.  He has not been able to fully empty his bladder.  He was seen by his PCP on 2/6 and was noted to have crackles, concerning for pulmonary edema.  Lasix was doubled to 40 mg BID x 48 hours.  He continued to have symptoms and so family called 911 tonight.  His buttocks pressure ulcer is stable.  Foley was placed in the ER with 700 cc UOP.    ER Course:  Recently here with CHF and PE.  Worse after dc.  Increased Lasix, still more SOB.  Creatinine 1.28 -> 1.64, vascular congestion.  Given Lasix.  Not currently hypoxic.     Review of Systems: As mentioned in the history of present illness. All other systems reviewed and are negative.  Limited by dementia  Past Medical History:  Diagnosis Date   Adenocarcinoma of right lung ([place]) 01/06/2016   Anginal chest pain at rest    Chronic non, controlled on Lorazepam   Anxiety    Arthritis    "all over"    BPH (benign prostatic hyperplasia)    CAD (coronary artery disease)    Chronic lower back pain    Complication of anesthesia    "problems making water afterwards"   Depression    DJD (degenerative joint disease)    Esophageal ulcer without bleeding    bid ppi indefinitely   Family history of adverse  reaction to anesthesia    "children w/PONV"   GERD (gastroesophageal reflux disease)    Headache    "couple/week maybe" (09/04/2015)   Hemochromatosis    Possible, elevated iron stores, hook-like osteophytes on hand films, normal LFTs   Hepatitis    "yellow jaundice as a baby"   History of ASCVD    MULTIVESSEL   Hyperlipidemia    Hypertension    Mild aortic stenosis 06/23/2021   Osteoarthritis    Pneumonia    "several times; got a little now" (09/04/2015)   Rheumatoid arthritis ([place])    Subdural hematoma ([place])    small after fall 09/2016-plavix held, neurosurgery consulted.  Asymptomatic.     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[place]) 02/2018   on Lovenox lifelong since he failed PO Eliquis   Past Surgical History:  Procedure Laterality Date   ARM SKIN LESION BIOPSY / EXCISION Left 09/02/2015   CATARACT EXTRACTION W/ INTRAOCULAR LENS  IMPLANT, BILATERAL Bilateral    CHOLECYSTECTOMY OPEN     CORNEAL TRANSPLANT Bilateral    "one at [facility]; one at [facility]"   [place] W/ STENT     DE stent ostium into the right radial free graft at OM-, 02-2007   ESOPHAGOGASTRODUODENOSCOPY N/A 11/09/2014   Procedure:  ESOPHAGOGASTRODUODENOSCOPY (EGD);  Surgeon: [name], MD;  Location: [facility] ENDOSCOPY;  Service: Endoscopy;  Laterality: N/A;   INGUINAL HERNIA REPAIR     JOINT REPLACEMENT     LEFT HEART CATH AND CORS/GRAFTS ANGIOGRAPHY N/A 06/27/2017   Procedure: LEFT HEART CATH AND CORS/GRAFTS ANGIOGRAPHY;  Surgeon: [name], MD;  Location: [place] CV LAB;  Service: Cardiovascular;  Laterality: N/A;   NASAL SINUS SURGERY     SHOULDER OPEN ROTATOR CUFF REPAIR Right    TOTAL KNEE ARTHROPLASTY Bilateral    Social History:  reports that he has never smoked. He has been exposed to tobacco smoke. He has quit using smokeless tobacco.  His smokeless tobacco use included chew. He reports that he does not drink alcohol and  does not use drugs.  Allergies  Allergen Reactions   Feldene [Piroxicam] Rash    Blistering rash   Neomycin-Polymyxin-Hc Itching   Statins Other (See Comments)    Myalgias   Valium [Diazepam] Other (See Comments)    Dizziness    Latex Rash   Tequin [Gatifloxacin] Anxiety   Vibra-Tab [Doxycycline] Nausea Only    Family History  Problem Relation Age of Onset   Arthritis-Osteo Sister    Heart attack Brother    Heart attack Other    Cancer Neg Hx     Prior to Admission medications   Medication Sig Start Date End Date Taking? Authorizing Provider  acetaminophen (TYLENOL) 500 MG tablet Take 500 mg by mouth every 6 (six) hours as needed for mild pain, moderate pain, fever or headache.    [provider]  amoxicillin-clavulanate (AUGMENTIN) 875-125 MG tablet Take 1 tablet by mouth 2 (two) times daily. 05/10/22   [name], MD  apixaban (ELIQUIS) 2.5 MG TABS tablet Take 1 tablet (2.5 mg total) by mouth 2 (two) times daily. 05/03/22 07/02/22  Elodia Florence., MD  Cholecalciferol (VITAMIN D-3 PO) Take 1 capsule by mouth in the morning.    [provider]  cyanocobalamin 1000 MCG tablet Take 1 tablet (1,000 mcg total) by mouth daily. 05/04/22 07/03/22  Elodia Florence., MD  ferrous sulfate 325 (65 FE) MG tablet Take 1 tablet (325 mg total) by mouth every other day. 05/03/22 07/02/22  Elodia Florence., MD  finasteride (PROSCAR) 5 MG tablet Take 1 tablet (5 mg total) by mouth daily. 04/11/22   [name], MD  FLUoxetine (PROZAC) 20 MG capsule TAKE 1 CAPSULE EVERY DAY Patient taking differently: Take 20 mg by mouth in the morning. 01/05/22   [name], MD  folic acid (FOLVITE) 1 MG tablet Take 1 tablet (1 mg total) by mouth daily. 05/04/22 07/03/22  Elodia Florence., MD  furosemide (LASIX) 40 MG tablet Take 1 tablet (40 mg total) by mouth daily. 05/04/22 06/03/22  Elodia Florence., MD  Lifitegrast Shirley Friar) 5 % SOLN Place 1 drop into both  eyes 2 (two) times daily. Patient taking differently: Place 1 drop into both eyes 2 (two) times daily as needed (dry eyes). 12/01/21     pantoprazole (PROTONIX) 40 MG tablet Take 1 tablet (40 mg total) by mouth in the morning. 04/18/22   [name], MD  polyethylene glycol (MIRALAX / GLYCOLAX) 17 g packet Take 17 g by mouth daily as needed for mild constipation. 08/14/18   Angiulli, Lavon Paganini, PA-C  rosuvastatin (CRESTOR) 5 MG tablet TAKE 1 TABLET EVERY DAY 05/06/22   Jettie Booze, MD  tamsulosin [facility]) 0.4  MG CAPS capsule TAKE 1 CAPSULE EVERY DAY Patient taking differently: Take 0.4 mg by mouth daily with supper. 01/10/22   [name], MD  Zinc Oxide (TRIPLE PASTE) 12.8 % ointment Apply topically 4 (four) times daily as needed (skin barrier). 05/03/22   Elodia Florence., MD  ZINC OXIDE EX Apply 1 application  topically 4 (four) times daily as needed (skin barrier).    [provider]    Physical Exam: Vitals:   05/12/22 0200 05/12/22 0230 05/12/22 0300 05/12/22 0336  BP: (!) 110/52 (!) 128/53 (!) 111/48 (!) 98/52  Pulse: 71 68 70 72  Resp: (!) 27 (!) 30 (!) 27 (!) 22  Temp:  98.5 F (36.9 C)  98.1 F (36.7 C)  TempSrc:  Oral  Oral  SpO2: 95% 94% 97% 93%  Weight:      Height:       General:  Appears calm and comfortable and is in NAD, chronically ill appearing Eyes:   EOMI, normal lids, iris ENT:   hard of hearing, grossly normal lips & tongue, mildly dry mm Neck:  no LAD, masses or thyromegaly Cardiovascular:  RRR, + murmur. No LE edema.  Respiratory:   Diffuse rhonchi, basilar crackles.  Mildly increased respiratory effort. Abdomen:  soft, NT, ND Skin:  no rash or induration seen on limited exam Musculoskeletal:  grossly normal tone BUE/BLE, good ROM, no bony abnormality Psychiatric:  pleasantly confused mood and affect, AOx 1-2 Neurologic:  CN 2-12 grossly intact, moves all extremities in coordinated fashion, sensation intact   Radiological  Exams on Admission: Independently reviewed - see discussion in A/P where applicable  DG Chest Port 1 View  Result Date: 05/11/2022 CLINICAL DATA:  Shortness of breath edema EXAM: PORTABLE CHEST 1 VIEW COMPARISON:  05/01/2022 FINDINGS: Post sternotomy changes. Small right-sided pleural effusion. Cardiomegaly with vascular congestion versus bronchitic change. Aortic atherosclerosis. IMPRESSION: Small right pleural effusion. Cardiomegaly with vascular congestion versus bronchitic change. Electronically Signed   By: Donavan Foil M.D.   On: 05/11/2022 22:44    EKG: Independently reviewed.  Aflutter with rate 67; prolonged QTc 730 (!); nonspecific ST changes with no evidence of acute ischemia   Labs on Admission: I have personally reviewed the available labs and imaging studies at the time of the admission.  Pertinent labs:    Na++ 130 BUN 31/Creatinine 1.64/GFR 39; 23/1.28/53 on 1/30 Albumin 3.3 BNP 169.5 HS troponin 23, 26 WBC 4 Hgb 9 Platelets 106   Assessment and Plan: Principal Problem:   Acute on chronic systolic CHF (congestive heart failure) (HCC) Active Problems:   BPH (benign prostatic hyperplasia)   Hyperlipidemia LDL goal <70   Essential hypertension   S/P CABG (coronary artery bypass graft)   Stage 3a chronic kidney disease (HCC)   Prolonged Q-T interval on ECG   Pressure injury of skin    Acute on chronic systolic CHF -Patient with recent admission for CHF presenting with recurrent respiratory compromise with crackles, rhonchi, some SOB -He did have urinary retention on presentation and this is likely contributing to the issue; foley placed and anticipate improvement -CXR consistent with vascular congestion -Stable BNP -Will place in observation status with telemetry, as there are no current findings necessitating admission (hemodynamic instability, severe electrolyte abnormalities, cardiac arrhythmias, ACS, severe pulmonary edema requiring new O2 therapy, AMS) -CHF  order set utilized -Was given Lasix 40 mg x 1 in ER and will repeat with 40 mg IV BID -Continue [place] O2 for now - not  currently needing -Given recurrent need for admission, will place palliative care consult (he is also full code at 89yo with dementia, which is likely to lead to a poor outcome) *Note: patient also tested positive for RSV, which is also likely contributing to his current presentation  Urinary retention -Patient with known BPH -Intermittent voiding difficulty at home -Foley placed in ER with 700 cc rapid UOP -Recommend leaving foley in place and following up with urology for voiding trial in several weeks -Continue finasteride and tamsulosin  AKI on stage 3a CKD -Likely due to prerenal failure from subacute urinary retention and also decreased renal perfusion in the setting of CHF -Anticipate improvement with diuresis and foley placement -Follow up renal function by BMP -Avoid ACEI and NSAIDs   Prolonged QTc -Initial EKG was clearly incorrect, as it listed the QTc as >700; repeat with Qtc 510 which is more c/w baseline -Will attempt to avoid QT-prolonging medications such as PPI, nausea meds, SSRIs  Recent PE -Complicated case, discussed with Dr. [name] during last hospitalization -Continue Eliquis  Dementia -Family reports mild to moderate cognitive impairment -Continue fluoxetine  CAD -s/p CABG -No current report of CP  HTN -No meds other than tamsulosin  HLD -Continue rosuvstatin  Pressure ulcer -Stage 1, per RN, on buttocks -Continue Zinc oxide -Will place wound care consult   Advance Care Planning:   Code Status: Full Code - Code status was discussed with the family at the time of admission.  The patient would want to receive full resuscitative measures at this time. He does have a DNR form in his ACP documents but son says that he would want full code.  Palliative care consulted.  Consults: Palliative care ;CHF navigator; PT/OT; TOC team;  nutrition  DVT Prophylaxis: Eliquis  Family Communication: Son was present throughout evaluation  Severity of Illness: The appropriate patient status for this patient is OBSERVATION. Observation status is judged to be reasonable and necessary in order to provide the required intensity of service to ensure the patient's safety. The patient's presenting symptoms, physical exam findings, and initial radiographic and laboratory data in the context of their medical condition is felt to place them at decreased risk for further clinical deterioration. Furthermore, it is anticipated that the patient will be medically stable for discharge from the hospital within 2 midnights of admission.   Author: [name], MD 05/12/2022 4:40 AM  For on call review [email or web address].

## 2022-05-12 NOTE — Evaluation (Signed)
Physical Therapy Evaluation Patient Details Name: Evan [name] MRN: [id number] DOB: 21-Aug-1927 Today's Date: 05/12/2022  History of Present Illness  89 yo male presents to [facility] on 2/7 with ShOB and decreased urine output, +RSV and HF exacerbation. Recent hospitalization 1/24-1/30 with newly diagnosed PE and CHF exacerbation. 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  Clinical Impression   Pt presents with generalized weakness R>L given history of remote CVA, impaired balance, ataxic gait, and decreased activity tolerance vs baseline.  Pt to benefit from acute PT to address deficits. Pt ambulated short room distance with HHA +2, overall pt requiring min-mod +2 assist this session which per family is not far from baseline. Pt and family request continue with HHPT.  PT to progress mobility as tolerated, and will continue to follow acutely.         Recommendations for follow up therapy are one component of a multi-disciplinary discharge planning process, led by the attending physician.  Recommendations may be updated based on patient status, additional functional criteria and insurance authorization.  Follow Up Recommendations Home health PT      Assistance Recommended at Discharge Frequent or constant Supervision/Assistance  Patient can return home with the following  A little help with bathing/dressing/bathroom;Assistance with cooking/housework;Assist for transportation;Help with stairs or ramp for entrance;Direct supervision/assist for medications management;Direct supervision/assist for financial management;A lot of help with walking and/or transfers    Equipment Recommendations None recommended by PT  Recommendations for Other Services        Functional Status Assessment Patient has had a recent decline in their functional status and demonstrates the ability to make significant improvements in function in a reasonable and predictable amount of time.     Precautions / Restrictions Precautions Precautions: Fall Precaution Comments: residual R hemiparesis Restrictions Weight Bearing Restrictions: No      Mobility  Bed Mobility Overal bed mobility: Needs Assistance Bed Mobility: Supine to Sit   Sidelying to sit: Mod assist, +2 for safety/equipment       General bed mobility comments: assist for trunk elevation, LE progression, and scooting forward with assist of bed pad    Transfers Overall transfer level: Needs assistance Equipment used: Rolling walker (2 wheels), 2 person hand held assist Transfers: Sit to/from Stand Sit to Stand: Mod assist, +2 physical assistance           General transfer comment: mod +2 for power up, rise, steadying. First attempt with RW, pt with poor RUE placement on RW and heavy posterior bias, improved standing balance with HHA +2    Ambulation/Gait Ambulation/Gait assistance: Min assist, +2 physical assistance Gait Distance (Feet): 15 Feet Assistive device: 2 person hand held assist Gait Pattern/deviations: Step-through pattern, Decreased stride length, Ataxic, Trunk flexed Gait velocity: decr     General Gait Details: assist to steady, correct R lateral and posterior bias. Ataxic gait with poor motor grading and foot placement, heavy heel strike  Stairs            Wheelchair Mobility    Modified Rankin (Stroke Patients Only)       Balance Overall balance assessment: Needs assistance Sitting-balance support: No upper extremity supported, Feet supported Sitting balance-[name] Scale: Fair Sitting balance - Comments: fair to poor, posterior bias Postural control: Posterior lean, Right  lateral lean Standing balance support: During functional activity, Bilateral upper  extremity supported Standing balance-[name] Scale: Poor Standing balance comment: reliant on PT and OT assist                             Pertinent Vitals/Pain Pain Assessment Pain Assessment: No/denies pain    Home Living Family/patient expects to be discharged to:: Private residence Living Arrangements: Alone (family rotates to stay with him around the clock) Available Help at Discharge: Family;Available 24 hours/day Type of Home: House Home Access: Ramped entrance       Home Layout: Two level;Able to live on main level with bedroom/bathroom Home Equipment: Rolling Walker (2 wheels);Hospital bed;Wheelchair - manual;BSC/3in1;Shower seat      Prior Function Prior Level of Function : Needs assist             Mobility Comments: walks with bilat platform RW and gait belt typically, per children pt has one person in front and one person behind him when he walks and he has not been doing as much walking since last hospitalization ADLs Comments: assist for all ADLs     Hand Dominance   Dominant Hand: Right    Extremity/Trunk Assessment   Upper Extremity Assessment Upper Extremity Assessment: Defer to OT evaluation    Lower Extremity Assessment Lower Extremity Assessment: Generalized weakness;RLE deficits/detail RLE Deficits / Details: incoordination with poor motor grading, at least 3/5 symmetrically BLE    Cervical / Trunk Assessment Cervical / Trunk Assessment: Kyphotic  Communication   Communication: HOH  Cognition Arousal/Alertness: Awake/alert Behavior During Therapy: WFL for tasks assessed/performed Overall Cognitive Status: Impaired/Different from baseline Area of Impairment: Following commands, Safety/judgement, Problem solving                       Following Commands: Follows one step commands with increased time, Follows one step commands consistently Safety/Judgement: Decreased awareness of safety   Problem Solving: Difficulty  sequencing, Requires verbal cues, Requires tactile cues, Decreased initiation, Slow processing          General Comments General comments (skin integrity, edema, etc.): vss    Exercises     Assessment/Plan    PT Assessment Patient needs continued PT services  PT Problem List Decreased strength;Decreased range of motion;Decreased activity tolerance;Decreased balance;Decreased mobility;Decreased coordination;Decreased cognition;Decreased knowledge of use of DME;Decreased safety awareness;Decreased knowledge of precautions;Cardiopulmonary status limiting activity;Impaired tone       PT Treatment Interventions DME instruction;Gait training;Functional mobility training;Therapeutic activities;Therapeutic exercise;Balance training;Neuromuscular re-education;Patient/family education    PT Goals (Current goals can be found in the Care Plan section)  Acute Rehab PT Goals PT Goal Formulation: With patient/family Time For Goal Achievement: 05/26/22 Potential to Achieve Goals: Good    Frequency Min 3X/week     Co-evaluation PT/OT/SLP Co-Evaluation/Treatment: Yes Reason for Co-Treatment: For patient/therapist safety;To address functional/ADL transfers PT goals addressed during session: Mobility/safety with mobility;Balance         AM-PAC PT "6 Clicks" Mobility  Outcome Measure Help needed turning from your back to your side while in a flat bed without using bedrails?: A Little Help needed moving from lying on your back to sitting on the side of a flat bed without using bedrails?: A Lot Help needed moving to and from a bed to a chair (including a wheelchair)?: A Lot Help needed standing up from a chair using your arms (e.g., wheelchair or bedside chair)?: A Lot Help needed to walk  in hospital room?: A Lot Help needed climbing 3-5 steps with a railing? : A Lot 6 Click Score: 13    End of Session Equipment Utilized During Treatment: Gait belt Activity Tolerance: Patient tolerated  treatment well;Patient limited by fatigue Patient left: with family/visitor present;in chair;with call bell/phone within reach;with chair alarm set Nurse Communication: Mobility status PT Visit Diagnosis: Unsteadiness on feet (R26.81);Muscle weakness (generalized) (M62.81);Difficulty in walking, not elsewhere classified (R26.2)    Time: 0940-1001 PT Time Calculation (min) (ACUTE ONLY): 21 min   Charges:   PT Evaluation $PT Eval Low Complexity: 1 Low         Amel Kitch S, PT DPT Acute Rehabilitation Services Pager [phone]  Office [phone]   Roxine Caddy E Ruffin Pyo 05/12/2022, 10:47 AM

## 2022-05-12 NOTE — Progress Notes (Signed)
Initial Nutrition Assessment  DOCUMENTATION CODES:   Severe malnutrition in context of chronic illness  INTERVENTION:   Multivitamin w/ minerals daily Liberalize pt diet to regular due to increased needs and malnutrition Continue 1.5 L fluid restriction  Encourage good PO intake Meal ordering with assist Ensure Enlive po BID, each supplement provides 350 kcal and 20 grams of protein.  NUTRITION DIAGNOSIS:   Severe Malnutrition related to chronic illness (CHF, COPD) as evidenced by severe muscle depletion, severe fat depletion.  GOAL:   Patient will meet greater than or equal to 90% of their needs  MONITOR:   PO intake, Supplement acceptance, Labs, Weight trends, Skin  REASON FOR ASSESSMENT:   Consult  (Nutritional Goals)  ASSESSMENT:   89 y.o. male presented to the ED with SOB. Pt recently admitted 1/24-1/30 for CHF exacerbation and recurrent PE. PMH includes lung cancer, CAD s/p CABG, HTN, HLD, GERD, COPD, CKD III1, and CHF. Pt admitted with acute on chronic CHF, urinary retention, AKI on CKD, and RSV+.   Pt laying in bed, daughter at bedside, assisting in feeding lunch. Reports that his appetite is normally good at home, except yesterday and did not want anything to eat. Reports that he typically has two meals per day, late breakfast and early dinner. Family does the cooking for him and try to limit his sodium intake. Does not usually drink Ensure because they have sodium in them. Explained they can be useful when pt does not have a great appetite to provide calories and protein.  Endorses a 15# weight loss since last summer and has a UBW of 165#. Reports that pt uses a walker to ambulate at home. Per EMR, pt weight appears to fluctuated between 68-75 kg over the past year, current weight is below 68 kg. Pt with chronic illness and unable to assess fluid loss versus actual dry weight loss.  Wt Readings from Last 20 Encounters:  05/12/22 65.9 kg  05/10/22 68.9 kg  05/02/22  68 kg  04/11/22 71.2 kg  03/25/22 71.4 kg  03/15/22 75.5 kg  02/15/22 70.8 kg  02/10/22 70.8 kg  01/05/22 70.8 kg  11/25/21 72.6 kg  11/01/21 71.2 kg  11/01/21 71.2 kg  08/09/21 68.4 kg  07/28/21 72.5 kg  06/25/21 69.6 kg  05/31/21 74.8 kg  05/07/21 73.5 kg   Medications reviewed and include: Colace, Lasix, Protonix, Potassium Chloride  Labs reviewed: Sodium 131, Potassium 2.9, BUN 29, Creatinine 1.53, Magensium 1.9   NUTRITION - FOCUSED PHYSICAL EXAM:  Flowsheet Row Most Recent Value  Orbital Region Severe depletion  Upper Arm Region Moderate depletion  Thoracic and Lumbar Region Severe depletion  Buccal Region Severe depletion  Temple Region Severe depletion  Clavicle Bone Region Severe depletion  Clavicle and Acromion Bone Region Severe depletion  Scapular Bone Region Severe depletion  Dorsal Hand Moderate depletion  Patellar Region Unable to assess  Anterior Thigh Region Unable to assess  Posterior Calf Region Moderate depletion  Edema (RD Assessment) None  Hair Reviewed  Eyes Reviewed  Mouth Reviewed  Skin Reviewed  Nails Unable to assess   Diet Order:   Diet Order             Diet regular Room service appropriate? Yes with Assist; Fluid consistency: Thin; Fluid restriction: 1500 mL Fluid  Diet effective now                  EDUCATION NEEDS:   No education needs have been identified at this time  Skin:  Skin Assessment: Skin Integrity Issues: Skin Integrity Issues:: Stage I Stage I: Sacrum  Last BM:  Uknown/PTA  Height:  Ht Readings from Last 1 Encounters:  05/11/22 5\' 3"  (1.6 m)   Weight:  Wt Readings from Last 1 Encounters:  05/12/22 65.9 kg   Ideal Body Weight:  56.4 kg  BMI:  Body mass index is 25.74 kg/m.  Estimated Nutritional Needs:  Kcal:  1800-2000 Protein:  90-110 grams Fluid:  >/= 1.8 L   Hermina Barters RD, LDN Clinical Dietitian See [employer] for contact information.

## 2022-05-13 DIAGNOSIS — N179 Acute kidney failure, unspecified: Secondary | ICD-10-CM | POA: Diagnosis present

## 2022-05-13 DIAGNOSIS — I13 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 failure and stage 1 through stage 4 chronic kidney disease, or unspecified chronic kidney disease: Secondary | ICD-10-CM | POA: Diagnosis present

## 2022-05-13 DIAGNOSIS — L89151 Pressure ulcer of sacral region, stage 1: Secondary | ICD-10-CM | POA: Diagnosis present

## 2022-05-13 DIAGNOSIS — N1832 Chronic kidney disease, stage 3b: Secondary | ICD-10-CM | POA: Diagnosis present

## 2022-05-13 DIAGNOSIS — I5033 Acute on chronic diastolic (congestive) heart failure: Secondary | ICD-10-CM | POA: Diagnosis present

## 2022-05-13 DIAGNOSIS — I4821 Permanent atrial fibrillation: Secondary | ICD-10-CM | POA: Diagnosis present

## 2022-05-13 DIAGNOSIS — N401 Enlarged prostate with lower urinary tract symptoms: Secondary | ICD-10-CM | POA: Diagnosis not present

## 2022-05-13 DIAGNOSIS — E876 Hypokalemia: Secondary | ICD-10-CM | POA: Diagnosis not present

## 2022-05-13 DIAGNOSIS — Z7901 Long term (current) use of anticoagulants: Secondary | ICD-10-CM | POA: Diagnosis not present

## 2022-05-13 DIAGNOSIS — E43 Unspecified severe protein-calorie malnutrition: Secondary | ICD-10-CM | POA: Diagnosis present

## 2022-05-13 DIAGNOSIS — M109 Gout, unspecified: Secondary | ICD-10-CM | POA: Diagnosis present

## 2022-05-13 DIAGNOSIS — J438 Other emphysema: Secondary | ICD-10-CM | POA: Diagnosis not present

## 2022-05-13 DIAGNOSIS — I10 Essential (primary) hypertension: Secondary | ICD-10-CM | POA: Diagnosis not present

## 2022-05-13 DIAGNOSIS — Z66 Do not resuscitate: Secondary | ICD-10-CM | POA: Diagnosis present

## 2022-05-13 DIAGNOSIS — C3491 Malignant neoplasm of unspecified part of right bronchus or lung: Secondary | ICD-10-CM | POA: Diagnosis not present

## 2022-05-13 DIAGNOSIS — B974 Respiratory syncytial virus as the cause of diseases classified elsewhere: Secondary | ICD-10-CM | POA: Diagnosis present

## 2022-05-13 DIAGNOSIS — Z515 Encounter for palliative care: Secondary | ICD-10-CM | POA: Diagnosis not present

## 2022-05-13 DIAGNOSIS — Z79899 Other long term (current) drug therapy: Secondary | ICD-10-CM | POA: Diagnosis not present

## 2022-05-13 DIAGNOSIS — Z951 Presence of aortocoronary bypass graft: Secondary | ICD-10-CM | POA: Diagnosis not present

## 2022-05-13 DIAGNOSIS — Z1152 Encounter for screening for COVID-19: Secondary | ICD-10-CM | POA: Diagnosis not present

## 2022-05-13 DIAGNOSIS — J441 Chronic obstructive pulmonary disease with (acute) exacerbation: Secondary | ICD-10-CM | POA: Diagnosis present

## 2022-05-13 DIAGNOSIS — I4892 Unspecified atrial flutter: Secondary | ICD-10-CM | POA: Diagnosis not present

## 2022-05-13 DIAGNOSIS — R338 Other retention of urine: Secondary | ICD-10-CM | POA: Diagnosis present

## 2022-05-13 DIAGNOSIS — M069 Rheumatoid arthritis, unspecified: Secondary | ICD-10-CM | POA: Diagnosis present

## 2022-05-13 DIAGNOSIS — L89629 Pressure ulcer of left heel, unspecified stage: Secondary | ICD-10-CM | POA: Diagnosis not present

## 2022-05-13 DIAGNOSIS — F32A Depression, unspecified: Secondary | ICD-10-CM | POA: Diagnosis present

## 2022-05-13 DIAGNOSIS — E785 Hyperlipidemia, unspecified: Secondary | ICD-10-CM | POA: Diagnosis present

## 2022-05-13 DIAGNOSIS — R0602 Shortness of breath: Secondary | ICD-10-CM | POA: Insufficient documentation

## 2022-05-13 DIAGNOSIS — I5023 Acute on chronic systolic (congestive) heart failure: Secondary | ICD-10-CM | POA: Diagnosis not present

## 2022-05-13 DIAGNOSIS — F0390 Unspecified dementia without behavioral disturbance: Secondary | ICD-10-CM | POA: Diagnosis present

## 2022-05-13 DIAGNOSIS — E871 Hypo-osmolality and hyponatremia: Secondary | ICD-10-CM | POA: Diagnosis not present

## 2022-05-13 LAB — BASIC METABOLIC PANEL
Anion gap: 10 (ref 5–15)
BUN: 26 mg/dL — ABNORMAL HIGH (ref 8–23)
CO2: 21 mmol/L — ABNORMAL LOW (ref 22–32)
Calcium: 8.3 mg/dL — ABNORMAL LOW (ref 8.9–10.3)
Chloride: 99 mmol/L (ref 98–111)
Creatinine, Ser: 1.43 mg/dL — ABNORMAL HIGH (ref 0.61–1.24)
GFR, Estimated: 46 mL/min — ABNORMAL LOW (ref >60)
Glucose, Bld: 98 mg/dL (ref 70–99)
Potassium: 3.8 mmol/L (ref 3.5–5.1)
Sodium: 130 mmol/L — ABNORMAL LOW (ref 135–145)

## 2022-05-13 LAB — MAGNESIUM: Magnesium: 1.8 mg/dL (ref 1.7–2.4)

## 2022-05-13 MED ORDER — GUAIFENESIN 100 MG/5ML PO LIQD
5.0000 mL | ORAL | Status: DC | PRN
  Administered 2022-05-13: 5 mL via ORAL
  Filled 2022-05-13 (×2): qty 10

## 2022-05-13 MED ORDER — GUAIFENESIN ER 600 MG PO TB12
600.0000 mg | ORAL_TABLET | Freq: Two times a day (BID) | ORAL | Status: DC
  Administered 2022-05-13 – 2022-05-17 (×9): 600 mg via ORAL
  Filled 2022-05-13 (×9): qty 1

## 2022-05-13 NOTE — Progress Notes (Signed)
PROGRESS NOTE    Evan [name]  JJK:[id number] DOB: 1927-10-02 DOA: 05/11/2022 PCP: [name], MD    Brief Narrative:   Evan [name] is a 89 y.o. male with past medical history significant for lung cancer (2017), BPH, CAD s/p CABG, HTN, HLD, RA, dementia; and VTE on Eliquis presenting with SOB.  He was last hospitalized from 1/24-30 with acute on chronic systolic CHF and recurrent acute PE, restarted on Eliquis per hematology recommendation.  He was eating well in the hospital but over the last week has had more respiratory congestion/SOB and decreased appetite.  He has not been able to fully empty his bladder.  He was seen by his PCP on 2/6 and was noted to have crackles, concerning for pulmonary edema.  Lasix was doubled to 40 mg BID x 48 hours.  He continued to have symptoms and so family called EMS and patient was transported to [facility] for further evaluation.  In the ED, temperature 98.5 F, HR 73, RR 26, BP 129/59, SpO2 95% on 2 L nasal cannula.  WBC 4.0, hemoglobin 9.0, platelets 106.  Sodium 130, potassium 3.7, chloride 98, CO2 20, glucose 98, BUN 31, creatinine 1.64.  AST 27, ALT 11, total bilirubin 0.4.  BNP 169.5.  High sensitive troponin 23 followed by 26.  RSV PCR positive.  Influenza A/B/COVID PCR negative.  Chest x-ray with small right pleural effusion, cardiomegaly with vascular congestion.  Patient was noted to have urinary retention in the ED and Foley catheter was placed with 700 mL of urine output.  TRH consulted for admission for further evaluation management of CHF exacerbation in the setting of RSV viral infection.  Assessment & Plan:   Acute on chronic systolic congestive heart failure exacerbation Patient presenting to the hospital with shortness of breath.  Was recently increased on home Lasix by PCP without appreciable improvement.  Chest x-ray on admission with noted vascular congestion with BNP elevation. Currently not requiring oxygen. Echocardiogram  with known ejection fraction 50 to 55% done on 1/25. --Patient received IV Lasix 40 mg twice daily, urine output 1350 mL last 24 hours.  -2.8 L since admission.  Responding.  Hold further IV Lasix.  Will put her back on oral Lasix tomorrow morning. -- Strict I's and O's and daily weights -- BMP daily  RSV viral infection with URI symptoms: RSV PCR positive on admission.  Not requiring oxygen. --Oral neb as needed -- Supportive care, mucolytic's, respite therapy.  Incentive and flutter valve.  Urinary retention in the setting of BPH Patient was noted to have urinary retention in the ED and Foley catheter was placed with 700 mL of rapid urine output. -- Foley catheter -- Continue finasteride and tamsulosin -- Outpatient follow-up with urology.  Will plan to discharge with Foley catheter.  Hypokalemia Potassium adequately corrected today.  Recheck tomorrow morning.    AKI on CKD stage IIIa Baseline creatinine 1.3 -1.4.  Presented with 1.64.  Already trending down.   - Foley catheter placed as above -- Hold further IV Lasix. -- Recheck tomorrow.  Pulmonary embolism -- Eliquis  CAD s/p CABG Denies chest pain  History of essential hypertension Currently not on antitenses outpatient. -- Continue to monitor BP closely  GERD:  --Protonix  HLD -- Crestor  Dementia -- Continue fluoxetine -- Palliative care consulted for assistance with goals of care/medical decision making, family desires continued aggressive measures, full code at this time.  Has adequate support system at home.  Sacral pressure  injury, stage I Pressure Injury 04/28/22 Sacrum Mid Stage 1 -  Intact skin with non-blanchable redness of a localized area usually over a bony prominence. (Active)  04/28/22 2030  Location: Sacrum  Location Orientation: Mid  Staging: Stage 1 -  Intact skin with non-blanchable redness of a localized area usually over a bony prominence.  Wound Description (Comments):   Present on  Admission: Yes  -- Zinc oxide -- Wound care following, continue frequent offloading, local wound care  Weakness/debility/gait disturbance/deconditioning: Currently lives at home with 24/7 care by family. -- PT/OT evaluation done.  Will plan to discharge home with home health PT OT.  DVT prophylaxis: apixaban (ELIQUIS) tablet 2.5 mg Start: 05/12/22 1000 apixaban (ELIQUIS) tablet 2.5 mg    Code Status: Full Code Family Communication: Updated son present at bedside  Disposition Plan:  Level of care: Telemetry Cardiac Status is: Inpatient.  Significant respiratory symptoms needing inpatient management.    Consultants:  Palliative care  Procedures:  Foley catheter placement  Antimicrobials:  None   Subjective:  Patient seen and examined.  Son was at the bedside.  He was trying to feed him.  Patient himself denies any complaints.  He still has significant cough, airway congestion.  Denies any shortness of breath or chest pain. His symptomatology is mostly consistent with bronchitis secondary to RSV.  Objective: Vitals:   05/12/22 2338 05/13/22 0432 05/13/22 0834 05/13/22 1110  BP: (!) 103/59 (!) 107/54  (!) 113/48  Pulse: 76 69  75  Resp: 20 20 (!) 22 18  Temp: 98.3 F (36.8 C) 98.3 F (36.8 C) 98.2 F (36.8 C) 98.3 F (36.8 C)  TempSrc: Oral Oral Oral Oral  SpO2: 96% 96%  96%  Weight:  65.3 kg    Height:        Intake/Output Summary (Last 24 hours) at 05/13/2022 1319 Last data filed at 05/13/2022 1111 Gross per 24 hour  Intake 360 ml  Output 1975 ml  Net -1615 ml   Filed Weights   05/11/22 2222 05/12/22 0451 05/13/22 0432  Weight: 68.9 kg 65.9 kg 65.3 kg    Examination:  Physical Exam: General: Frail debilitated.  Appropriate for age.  Pleasant and interactive but slow to respond.  Looks comfortable on room air. Cardiovascular: S1-S2 normal.  Regular rate rhythm. Respiratory: Bilateral conducted upper airway sounds.  Good air entry  bilateral. Gastrointestinal: Soft.  Nontender.  Bowel sound present. Ext: No swelling or edema.  No cyanosis. Neuro: Profound generalized weakness.  No focal deficits.    Data Reviewed: I have personally reviewed following labs and imaging studies  CBC: Recent Labs  Lab 05/10/22 1616 05/11/22 2301 05/12/22 0529  WBC 4.7 4.0 4.5  NEUTROABS 3,064 2.1  --   HGB 9.7* 9.0* 9.0*  HCT 29.5* 28.6* 27.4*  MCV 90.2 94.7 91.9  PLT 130* 106* 423*   Basic Metabolic Panel: Recent Labs  Lab 05/10/22 1616 05/11/22 2301 05/12/22 0523 05/12/22 0529 05/13/22 0114  NA 134* 130*  --  131* 130*  K 3.9 3.7  --  2.9* 3.8  CL 100 98  --  97* 99  CO2 23 20*  --  22 21*  GLUCOSE 86 98  --  101* 98  BUN 31* 31*  --  29* 26*  CREATININE 1.50* 1.64*  --  1.53* 1.43*  CALCIUM 8.7 8.3*  --  8.2* 8.3*  MG  --   --  1.9  --  1.8   GFR: Estimated Creatinine Clearance: 26.5  mL/min (A) (by C-G formula based on SCr of 1.43 mg/dL (H)). Liver Function Tests: Recent Labs  Lab 05/10/22 1616 05/11/22 2301  AST 17 27  ALT 8* 11  ALKPHOS  --  78  BILITOT 0.7 0.4  PROT 7.2 6.8  ALBUMIN  --  3.3*   No results for input(s): "LIPASE", "AMYLASE" in the last 168 hours. No results for input(s): "AMMONIA" in the last 168 hours. Coagulation Profile: No results for input(s): "INR", "PROTIME" in the last 168 hours. Cardiac Enzymes: No results for input(s): "CKTOTAL", "CKMB", "CKMBINDEX", "TROPONINI" in the last 168 hours. BNP (last 3 results) Recent Labs    06/25/21 0911  PROBNP 3,088*   HbA1C: No results for input(s): "HGBA1C" in the last 72 hours. CBG: No results for input(s): "GLUCAP" in the last 168 hours. Lipid Profile: No results for input(s): "CHOL", "HDL", "LDLCALC", "TRIG", "CHOLHDL", "LDLDIRECT" in the last 72 hours. Thyroid Function Tests: No results for input(s): "TSH", "T4TOTAL", "FREET4", "T3FREE", "THYROIDAB" in the last 72 hours. Anemia Panel: No results for input(s): "VITAMINB12",  "FOLATE", "FERRITIN", "TIBC", "IRON", "RETICCTPCT" in the last 72 hours. Sepsis Labs: No results for input(s): "PROCALCITON", "LATICACIDVEN" in the last 168 hours.  Recent Results (from the past 240 hour(s))  Resp panel by RT-PCR (RSV, Flu A&B, Covid) Anterior Nasal Swab     Status: Abnormal   Collection Time: 05/12/22  2:06 AM   Specimen: Anterior Nasal Swab  Result Value Ref Range Status   SARS Coronavirus 2 by RT PCR NEGATIVE NEGATIVE Final   Influenza A by PCR NEGATIVE NEGATIVE Final   Influenza B by PCR NEGATIVE NEGATIVE Final    Comment: (NOTE) The Xpert Xpress SARS-CoV-2/FLU/RSV plus assay is intended as an aid in the diagnosis of influenza from Nasopharyngeal swab specimens and should not be used as a sole basis for treatment. Nasal washings and aspirates are unacceptable for Xpert Xpress SARS-CoV-2/FLU/RSV testing.  Fact Sheet for Patients: EntrepreneurPulse.com.au  Fact Sheet for Healthcare Providers: IncredibleEmployment.be  This test is not yet approved or cleared by the [place] FDA and has been authorized for detection and/or diagnosis of SARS-CoV-2 by FDA under an Emergency Use Authorization (EUA). This EUA will remain in effect (meaning this test can be used) for the duration of the COVID-19 declaration under Section 564(b)(1) of the Act, 21 U.S.C. section 360bbb-3(b)(1), unless the authorization is terminated or revoked.     Resp Syncytial Virus by PCR POSITIVE (A) NEGATIVE Final    Comment: (NOTE) Fact Sheet for Patients: EntrepreneurPulse.com.au  Fact Sheet for Healthcare Providers: IncredibleEmployment.be  This test is not yet approved or cleared by the [place] FDA and has been authorized for detection and/or diagnosis of SARS-CoV-2 by FDA under an Emergency Use Authorization (EUA). This EUA will remain in effect (meaning this test can be used) for the duration of  the COVID-19 declaration under Section 564(b)(1) of the Act, 21 U.S.C. section 360bbb-3(b)(1), unless the authorization is terminated or revoked.  Performed at [facility], [place] [address], [place], [place] [address]   Respiratory (~20 pathogens) panel by PCR     Status: Abnormal   Collection Time: 05/12/22  2:06 AM   Specimen: Nasopharyngeal Swab; Respiratory  Result Value Ref Range Status   Adenovirus NOT DETECTED NOT DETECTED Final   Coronavirus 229E NOT DETECTED NOT DETECTED Final    Comment: (NOTE) The Coronavirus on the Respiratory Panel, DOES NOT test for the novel  Coronavirus (2019 nCoV)    Coronavirus HKU1 NOT DETECTED  NOT DETECTED Final   Coronavirus NL63 NOT DETECTED NOT DETECTED Final   Coronavirus OC43 NOT DETECTED NOT DETECTED Final   Metapneumovirus NOT DETECTED NOT DETECTED Final   Rhinovirus / Enterovirus NOT DETECTED NOT DETECTED Final   Influenza A NOT DETECTED NOT DETECTED Final   Influenza B NOT DETECTED NOT DETECTED Final   Parainfluenza Virus 1 NOT DETECTED NOT DETECTED Final   Parainfluenza Virus 2 NOT DETECTED NOT DETECTED Final   Parainfluenza Virus 3 NOT DETECTED NOT DETECTED Final   Parainfluenza Virus 4 NOT DETECTED NOT DETECTED Final   Respiratory Syncytial Virus DETECTED (A) NOT DETECTED Final   Bordetella pertussis NOT DETECTED NOT DETECTED Final   Bordetella Parapertussis NOT DETECTED NOT DETECTED Final   Chlamydophila pneumoniae NOT DETECTED NOT DETECTED Final   Mycoplasma pneumoniae NOT DETECTED NOT DETECTED Final    Comment: Performed at [facility], [place] [address], [place], [place] [address]         Radiology Studies: DG Chest Port 1 View  Result Date: 05/11/2022 CLINICAL DATA:  Shortness of breath edema EXAM: PORTABLE CHEST 1 VIEW COMPARISON:  05/01/2022 FINDINGS: Post sternotomy changes. Small right-sided pleural effusion. Cardiomegaly with vascular congestion versus bronchitic change. Aortic atherosclerosis. IMPRESSION:  Small right pleural effusion. Cardiomegaly with vascular congestion versus bronchitic change. Electronically Signed   By: Donavan Foil M.D.   On: 05/11/2022 22:44        Scheduled Meds:  apixaban  2.5 mg Oral BID   Chlorhexidine Gluconate Cloth  6 each Topical Q0600   docusate sodium  100 mg Oral BID   feeding supplement  237 mL Oral BID BM   finasteride  5 mg Oral Daily   FLUoxetine  20 mg Oral Daily   guaiFENesin  600 mg Oral BID   multivitamin with minerals  1 tablet Oral Daily   pantoprazole  40 mg Oral Daily   rosuvastatin  5 mg Oral Daily   sodium chloride flush  3 mL Intravenous Q12H   tamsulosin  0.4 mg Oral Q supper   Continuous Infusions:   LOS: 0 days   Total time spent: 35 minutes

## 2022-05-14 DIAGNOSIS — I5023 Acute on chronic systolic (congestive) heart failure: Secondary | ICD-10-CM | POA: Diagnosis not present

## 2022-05-14 DIAGNOSIS — Z515 Encounter for palliative care: Secondary | ICD-10-CM | POA: Diagnosis not present

## 2022-05-14 LAB — CBC WITH DIFFERENTIAL/PLATELET
Abs Immature Granulocytes: 0 10*3/uL (ref 0.00–0.07)
Basophils Absolute: 0.1 10*3/uL (ref 0.0–0.1)
Basophils Relative: 1 %
Eosinophils Absolute: 0.1 10*3/uL (ref 0.0–0.5)
Eosinophils Relative: 2 %
HCT: 26.8 % — ABNORMAL LOW (ref 39.0–52.0)
Hemoglobin: 8.9 g/dL — ABNORMAL LOW (ref 13.0–17.0)
Lymphocytes Relative: 17 %
Lymphs Abs: 1.1 10*3/uL (ref 0.7–4.0)
MCH: 30 pg (ref 26.0–34.0)
MCHC: 33.2 g/dL (ref 30.0–36.0)
MCV: 90.2 fL (ref 80.0–100.0)
Monocytes Absolute: 0.3 10*3/uL (ref 0.1–1.0)
Monocytes Relative: 4 %
Neutro Abs: 4.9 10*3/uL (ref 1.7–7.7)
Neutrophils Relative %: 76 %
Platelets: 133 10*3/uL — ABNORMAL LOW (ref 150–400)
RBC: 2.97 MIL/uL — ABNORMAL LOW (ref 4.22–5.81)
RDW: 15.9 % — ABNORMAL HIGH (ref 11.5–15.5)
WBC: 6.5 10*3/uL (ref 4.0–10.5)
nRBC: 0 % (ref 0.0–0.2)
nRBC: 0 /100 WBC

## 2022-05-14 LAB — PHOSPHORUS: Phosphorus: 3.5 mg/dL (ref 2.5–4.6)

## 2022-05-14 LAB — BASIC METABOLIC PANEL
Anion gap: 12 (ref 5–15)
BUN: 36 mg/dL — ABNORMAL HIGH (ref 8–23)
CO2: 20 mmol/L — ABNORMAL LOW (ref 22–32)
Calcium: 8.5 mg/dL — ABNORMAL LOW (ref 8.9–10.3)
Chloride: 99 mmol/L (ref 98–111)
Creatinine, Ser: 1.7 mg/dL — ABNORMAL HIGH (ref 0.61–1.24)
GFR, Estimated: 37 mL/min — ABNORMAL LOW (ref >60)
Glucose, Bld: 110 mg/dL — ABNORMAL HIGH (ref 70–99)
Potassium: 3.7 mmol/L (ref 3.5–5.1)
Sodium: 131 mmol/L — ABNORMAL LOW (ref 135–145)

## 2022-05-14 LAB — MAGNESIUM: Magnesium: 1.8 mg/dL (ref 1.7–2.4)

## 2022-05-14 MED ORDER — HYDROXYZINE HCL 25 MG PO TABS
25.0000 mg | ORAL_TABLET | Freq: Two times a day (BID) | ORAL | Status: DC | PRN
  Administered 2022-05-14 – 2022-05-16 (×4): 25 mg via ORAL
  Filled 2022-05-14 (×4): qty 1

## 2022-05-14 MED ORDER — FUROSEMIDE 40 MG PO TABS
40.0000 mg | ORAL_TABLET | Freq: Every day | ORAL | Status: DC
  Administered 2022-05-14 – 2022-05-16 (×3): 40 mg via ORAL
  Filled 2022-05-14 (×3): qty 1

## 2022-05-14 NOTE — Progress Notes (Signed)
PROGRESS NOTE    Evan [name]  PNT:[id number] DOB: May 30, 1927 DOA: 05/11/2022 PCP: [name], MD    Brief Narrative:   Evan [name] is a 89 y.o. male with past medical history significant for lung cancer (2017), BPH, CAD s/p CABG, HTN, HLD, RA, dementia; and VTE on Eliquis presenting with SOB.  He was last hospitalized from 1/24-30 with acute on chronic systolic CHF and recurrent acute PE, restarted on Eliquis per hematology recommendation.  He was eating well in the hospital but over the last week has had more respiratory congestion/SOB and decreased appetite.  He has not been able to fully empty his bladder.  He was seen by his PCP on 2/6 and was noted to have crackles, concerning for pulmonary edema.  Lasix was doubled to 40 mg BID x 48 hours.  He continued to have symptoms and so family called EMS and patient was transported to [facility] for further evaluation.  In the ED, temperature 98.5 F, HR 73, RR 26, BP 129/59, SpO2 95% on 2 L nasal cannula.  WBC 4.0, hemoglobin 9.0, platelets 106.  Sodium 130, potassium 3.7, chloride 98, CO2 20, glucose 98, BUN 31, creatinine 1.64.  AST 27, ALT 11, total bilirubin 0.4.  BNP 169.5.  High sensitive troponin 23 followed by 26.  RSV PCR positive.  Influenza A/B/COVID PCR negative.  Chest x-ray with small right pleural effusion, cardiomegaly with vascular congestion.  Patient was noted to have urinary retention in the ED and Foley catheter was placed with 700 mL of urine output.  TRH consulted for admission for further evaluation management of CHF exacerbation in the setting of RSV viral infection.  Assessment & Plan:   Acute on chronic systolic congestive heart failure: Patient presenting to the hospital with shortness of breath.  Was recently increased on home Lasix by PCP without appreciable improvement.  Chest x-ray on admission with noted vascular congestion with BNP elevation. Currently not requiring oxygen. Echocardiogram with known  ejection fraction 50 to 55% done on 1/25. --Patient received IV Lasix 40 mg twice daily, creatinine 1.7 today.  Changed to Lasix 40 mg daily today.  -- Strict I's and O's and daily weights -- BMP daily His URI symptoms and shortness of breath may be mostly related to RSV with bronchitis.  RSV viral infection with URI symptoms: RSV PCR positive on admission.  Not requiring oxygen. --Oral neb as needed -- Supportive care, mucolytic's, respite therapy.  Incentive and flutter valve. -- Poor secretion clearance.  Start chest physiotherapy with respiratory therapy today.  Urinary retention in the setting of BPH Patient was noted to have urinary retention in the ED and Foley catheter was placed with 700 mL of rapid urine output. -- Foley catheter -- Continue finasteride and tamsulosin -- Outpatient follow-up with urology.  Will plan to discharge with Foley catheter.  Hypokalemia Potassium adequately corrected today.  Recheck tomorrow morning.    AKI on CKD stage IIIa Baseline creatinine 1.3 -1.4.  Presented with 1.64-1.7.  Discontinue IV Lasix.  Will start on lower dose of Lasix 40 mg daily. - Foley catheter placed as above -- Recheck tomorrow.  Pulmonary embolism -- Eliquis, continued.  CAD s/p CABG Denies chest pain  History of essential hypertension Currently not on antihypertensive as outpatient. -- Continue to monitor BP closely  GERD:  --Protonix  HLD -- Crestor  Dementia -- Continue fluoxetine -- Palliative care consulted for assistance with goals of care/medical decision making, family desires continued aggressive measures,  full code at this time.  Has adequate support system at home.  Agreed for outpatient palliative care follow-up.  Sacral pressure injury, stage I Pressure Injury 04/28/22 Sacrum Mid Stage 1 -  Intact skin with non-blanchable redness of a localized area usually over a bony prominence. (Active)  04/28/22 2030  Location: Sacrum  Location  Orientation: Mid  Staging: Stage 1 -  Intact skin with non-blanchable redness of a localized area usually over a bony prominence.  Wound Description (Comments):   Present on Admission: Yes  -- Zinc oxide -- Wound care following, continue frequent offloading, local wound care  Weakness/debility/gait disturbance/deconditioning: Currently lives at home with 24/7 care by family. -- PT/OT evaluation done.  Will plan to discharge home with home health PT OT.  When respiratory symptoms improved.  DVT prophylaxis: apixaban (ELIQUIS) tablet 2.5 mg Start: 05/12/22 1000 apixaban (ELIQUIS) tablet 2.5 mg    Code Status: Full Code Family Communication: Updated son Evan [name] present at bedside  Disposition Plan:  Level of care: Telemetry Cardiac Status is: Inpatient.  Significant respiratory symptoms needing inpatient management.    Consultants:  Palliative care  Procedures:  Foley catheter placement  Antimicrobials:  None   Subjective:  Patient seen and examined.  Poor historian.  He tells me he is fine.  One of his sons at the bedside.  Patient continues to have poor mucus clearance with difficulty coughing and expressing sputum.  Mostly laying in the bed.  Afebrile.  On room air. Detailed discussion with the patient's son at the bedside about his chronic medical issues, some difficulties of treatment including heart problems, kidney problems and now with RSV causing breathing troubles.  We are aiming to improve his cough and breathing so that he can go home.  Objective: Vitals:   05/13/22 1944 05/14/22 0008 05/14/22 0406 05/14/22 0759  BP: (!) 113/56 (!) 109/52 (!) 109/59 (!) 104/58  Pulse: 73 70 64 62  Resp: 20 18 20 16   Temp: 98.1 F (36.7 C) 97.7 F (36.5 C) 98 F (36.7 C) (!) 97.5 F (36.4 C)  TempSrc: Oral Oral Oral Axillary  SpO2: 99% 97% 96% 95%  Weight:   67.3 kg   Height:        Intake/Output Summary (Last 24 hours) at 05/14/2022 1028 Last data filed at 05/13/2022  2300 Gross per 24 hour  Intake 240 ml  Output 2100 ml  Net -1860 ml    Filed Weights   05/12/22 0451 05/13/22 0432 05/14/22 0406  Weight: 65.9 kg 65.3 kg 67.3 kg    Examination:  Physical Exam: General: Frail debilitated. Pleasant and interactive but slow to respond.  Looks comfortable on room air. Audible upper airway sounds. Cardiovascular: S1-S2 normal.  Regular rate rhythm. Respiratory: Bilateral conducted upper airway sounds.  Good air entry bilateral.  Poor inspiratory effort. Gastrointestinal: Soft.  Nontender.  Bowel sound present. Ext: No swelling or edema.  No cyanosis. Neuro: Profound generalized weakness.  No focal deficits.    Data Reviewed: I have personally reviewed following labs and imaging studies  CBC: Recent Labs  Lab 05/10/22 1616 05/11/22 2301 05/12/22 0529 05/14/22 0103  WBC 4.7 4.0 4.5 6.5  NEUTROABS 3,064 2.1  --  4.9  HGB 9.7* 9.0* 9.0* 8.9*  HCT 29.5* 28.6* 27.4* 26.8*  MCV 90.2 94.7 91.9 90.2  PLT 130* 106* 104* 133*    Basic Metabolic Panel: Recent Labs  Lab 05/10/22 1616 05/11/22 2301 05/12/22 0523 05/12/22 0529 05/13/22 0114 05/14/22 0103  NA 134* 130*  --  131* 130* 131*  K 3.9 3.7  --  2.9* 3.8 3.7  CL 100 98  --  97* 99 99  CO2 23 20*  --  22 21* 20*  GLUCOSE 86 98  --  101* 98 110*  BUN 31* 31*  --  29* 26* 36*  CREATININE 1.50* 1.64*  --  1.53* 1.43* 1.70*  CALCIUM 8.7 8.3*  --  8.2* 8.3* 8.5*  MG  --   --  1.9  --  1.8 1.8  PHOS  --   --   --   --   --  3.5    GFR: Estimated Creatinine Clearance: 22.3 mL/min (A) (by C-G formula based on SCr of 1.7 mg/dL (H)). Liver Function Tests: Recent Labs  Lab 05/10/22 1616 05/11/22 2301  AST 17 27  ALT 8* 11  ALKPHOS  --  78  BILITOT 0.7 0.4  PROT 7.2 6.8  ALBUMIN  --  3.3*    No results for input(s): "LIPASE", "AMYLASE" in the last 168 hours. No results for input(s): "AMMONIA" in the last 168 hours. Coagulation Profile: No results for input(s): "INR", "PROTIME"  in the last 168 hours. Cardiac Enzymes: No results for input(s): "CKTOTAL", "CKMB", "CKMBINDEX", "TROPONINI" in the last 168 hours. BNP (last 3 results) Recent Labs    06/25/21 0911  PROBNP 3,088*    HbA1C: No results for input(s): "HGBA1C" in the last 72 hours. CBG: No results for input(s): "GLUCAP" in the last 168 hours. Lipid Profile: No results for input(s): "CHOL", "HDL", "LDLCALC", "TRIG", "CHOLHDL", "LDLDIRECT" in the last 72 hours. Thyroid Function Tests: No results for input(s): "TSH", "T4TOTAL", "FREET4", "T3FREE", "THYROIDAB" in the last 72 hours. Anemia Panel: No results for input(s): "VITAMINB12", "FOLATE", "FERRITIN", "TIBC", "IRON", "RETICCTPCT" in the last 72 hours. Sepsis Labs: No results for input(s): "PROCALCITON", "LATICACIDVEN" in the last 168 hours.  Recent Results (from the past 240 hour(s))  Resp panel by RT-PCR (RSV, Flu A&B, Covid) Anterior Nasal Swab     Status: Abnormal   Collection Time: 05/12/22  2:06 AM   Specimen: Anterior Nasal Swab  Result Value Ref Range Status   SARS Coronavirus 2 by RT PCR NEGATIVE NEGATIVE Final   Influenza A by PCR NEGATIVE NEGATIVE Final   Influenza B by PCR NEGATIVE NEGATIVE Final    Comment: (NOTE) The Xpert Xpress SARS-CoV-2/FLU/RSV plus assay is intended as an aid in the diagnosis of influenza from Nasopharyngeal swab specimens and should not be used as a sole basis for treatment. Nasal washings and aspirates are unacceptable for Xpert Xpress SARS-CoV-2/FLU/RSV testing.  Fact Sheet for Patients: EntrepreneurPulse.com.au  Fact Sheet for Healthcare Providers: IncredibleEmployment.be  This test is not yet approved or cleared by the [place] FDA and has been authorized for detection and/or diagnosis of SARS-CoV-2 by FDA under an Emergency Use Authorization (EUA). This EUA will remain in effect (meaning this test can be used) for the duration of the COVID-19 declaration  under Section 564(b)(1) of the Act, 21 U.S.C. section 360bbb-3(b)(1), unless the authorization is terminated or revoked.     Resp Syncytial Virus by PCR POSITIVE (A) NEGATIVE Final    Comment: (NOTE) Fact Sheet for Patients: EntrepreneurPulse.com.au  Fact Sheet for Healthcare Providers: IncredibleEmployment.be  This test is not yet approved or cleared by the [place] FDA and has been authorized for detection and/or diagnosis of SARS-CoV-2 by FDA under an Emergency Use Authorization (EUA). This EUA will remain in effect (meaning this test can be used) for the  duration of the COVID-19 declaration under Section 564(b)(1) of the Act, 21 U.S.C. section 360bbb-3(b)(1), unless the authorization is terminated or revoked.  Performed at [facility], [place] [address], [place], [place] [address]   Respiratory (~20 pathogens) panel by PCR     Status: Abnormal   Collection Time: 05/12/22  2:06 AM   Specimen: Nasopharyngeal Swab; Respiratory  Result Value Ref Range Status   Adenovirus NOT DETECTED NOT DETECTED Final   Coronavirus 229E NOT DETECTED NOT DETECTED Final    Comment: (NOTE) The Coronavirus on the Respiratory Panel, DOES NOT test for the novel  Coronavirus (2019 nCoV)    Coronavirus HKU1 NOT DETECTED NOT DETECTED Final   Coronavirus NL63 NOT DETECTED NOT DETECTED Final   Coronavirus OC43 NOT DETECTED NOT DETECTED Final   Metapneumovirus NOT DETECTED NOT DETECTED Final   Rhinovirus / Enterovirus NOT DETECTED NOT DETECTED Final   Influenza A NOT DETECTED NOT DETECTED Final   Influenza B NOT DETECTED NOT DETECTED Final   Parainfluenza Virus 1 NOT DETECTED NOT DETECTED Final   Parainfluenza Virus 2 NOT DETECTED NOT DETECTED Final   Parainfluenza Virus 3 NOT DETECTED NOT DETECTED Final   Parainfluenza Virus 4 NOT DETECTED NOT DETECTED Final   Respiratory Syncytial Virus DETECTED (A) NOT DETECTED Final   Bordetella pertussis NOT DETECTED  NOT DETECTED Final   Bordetella Parapertussis NOT DETECTED NOT DETECTED Final   Chlamydophila pneumoniae NOT DETECTED NOT DETECTED Final   Mycoplasma pneumoniae NOT DETECTED NOT DETECTED Final    Comment: Performed at [facility], [address], [place], [place] [address]         Radiology Studies: No results found.      Scheduled Meds:  apixaban  2.5 mg Oral BID   Chlorhexidine Gluconate Cloth  6 each Topical Q0600   docusate sodium  100 mg Oral BID   feeding supplement  237 mL Oral BID BM   finasteride  5 mg Oral Daily   FLUoxetine  20 mg Oral Daily   furosemide  40 mg Oral Daily   guaiFENesin  600 mg Oral BID   multivitamin with minerals  1 tablet Oral Daily   pantoprazole  40 mg Oral Daily   rosuvastatin  5 mg Oral Daily   sodium chloride flush  3 mL Intravenous Q12H   tamsulosin  0.4 mg Oral Q supper   Continuous Infusions:   LOS: 1 day   Total time spent: 35 minutes

## 2022-05-14 NOTE — Progress Notes (Signed)
   Palliative Medicine Inpatient Follow Up Note HPI: Evan [name] is a 89 y.o. male with medical history significant of lung cancer (2017), BPH, CAD s/p CABG, HTN, HLD, RA, dementia; and VTE on Eliquis presenting with SOB.  He was last hospitalized from 1/24-30 with acute on chronic systolic CHF and recurrent acute PE, restarted on Eliquis per hematology recommendation.   Patient being treated for HF exacerbation, urinary retention, and RSV.   Palliative care has been asked to get involved to further address goals of care, Evan [name] has had 3 IP admissions in the past 6 months. He was formerly followed as an OP by [facility] [employer] Prior DNR in 2020.    Today's Discussion 05/14/2022  *Please note that this is a verbal dictation therefore any spelling or grammatical errors are due to the "[facility]" system interpretation.  Chart reviewed inclusive of vital signs, progress notes, laboratory results, and diagnostic images.   I met with Evan [name] and his son, Evan [name] at bedside. Evan [name] is more rhonchorus this morning than prior. His son shares the plan for him to remain in the hospital "a little longer" per the doctor. Discussed that RSV can cause these symptoms.   Evan [name] shares that his sister, Evan [name] is the primary decision maker and he asks that she is called to verify she wants OP Palliative services. I called Evan [name] this afternoon. I shared with her patients present condition.   We discussed code status though Evan [name] was clear that her father had always desired all interventions. I shared the possible trauma that this intervention would cause the patients body but daughter was very assertive stating he wants all measures.   Confirmed interest in OP Palliative services.   Questions and concerns addressed/Palliative Support Provided.   Objective Assessment: Vital Signs Vitals:   05/14/22 0800 05/14/22 1200  BP:  111/70  Pulse:    Resp:  18  Temp: 98.4 F (36.9 C) 98.4 F (36.9 C)  SpO2:       Intake/Output Summary (Last 24 hours) at 05/14/2022 1329 Last data filed at 05/13/2022 2300 Gross per 24 hour  Intake 240 ml  Output 750 ml  Net -510 ml   Last Weight  Most recent update: 05/14/2022  4:08 AM    Weight  67.3 kg (148 lb 5.9 oz)            Gen:  Elderly Caucasian M in NAD HEENT: moist mucous membranes CV: Regular rate and rhythm  PULM:  On RA, breathing is even and nonlabored ABD: soft/nontender  EXT: No edema  Neuro: Alert to self  SUMMARY OF RECOMMENDATIONS   FULL CODE/FULL SCOPE OF CARE   Open and honest conversations held in the setting of patients disease process   Goals are to regain physical function   Appreciate TOC setting up OP Palliative support on discharge   Ongoing PMT support  Billing based on MDM: High _____________________________________________________________________________________ [place] Team Team Cell Phone: [phone] Please utilize secure chat with additional questions, if there is no response within 30 minutes please call the above phone number  Palliative Medicine Team providers are available by phone from 7am to 7pm daily and can be reached through the team cell phone.  Should this patient require assistance outside of these hours, please call the patient's attending physician.

## 2022-05-15 DIAGNOSIS — J121 Respiratory syncytial virus pneumonia: Secondary | ICD-10-CM | POA: Insufficient documentation

## 2022-05-15 DIAGNOSIS — I5033 Acute on chronic diastolic (congestive) heart failure: Secondary | ICD-10-CM

## 2022-05-15 DIAGNOSIS — E785 Hyperlipidemia, unspecified: Secondary | ICD-10-CM | POA: Diagnosis not present

## 2022-05-15 DIAGNOSIS — I10 Essential (primary) hypertension: Secondary | ICD-10-CM | POA: Diagnosis not present

## 2022-05-15 DIAGNOSIS — L89629 Pressure ulcer of left heel, unspecified stage: Secondary | ICD-10-CM

## 2022-05-15 DIAGNOSIS — J438 Other emphysema: Secondary | ICD-10-CM | POA: Diagnosis not present

## 2022-05-15 DIAGNOSIS — I2699 Other pulmonary embolism without acute cor pulmonale: Secondary | ICD-10-CM

## 2022-05-15 DIAGNOSIS — Z515 Encounter for palliative care: Secondary | ICD-10-CM | POA: Diagnosis not present

## 2022-05-15 DIAGNOSIS — Z951 Presence of aortocoronary bypass graft: Secondary | ICD-10-CM

## 2022-05-15 HISTORY — DX: Respiratory syncytial virus pneumonia: J12.1

## 2022-05-15 LAB — BASIC METABOLIC PANEL
Anion gap: 11 (ref 5–15)
BUN: 35 mg/dL — ABNORMAL HIGH (ref 8–23)
CO2: 20 mmol/L — ABNORMAL LOW (ref 22–32)
Calcium: 8.5 mg/dL — ABNORMAL LOW (ref 8.9–10.3)
Chloride: 102 mmol/L (ref 98–111)
Creatinine, Ser: 1.41 mg/dL — ABNORMAL HIGH (ref 0.61–1.24)
GFR, Estimated: 47 mL/min — ABNORMAL LOW (ref >60)
Glucose, Bld: 106 mg/dL — ABNORMAL HIGH (ref 70–99)
Potassium: 3.5 mmol/L (ref 3.5–5.1)
Sodium: 133 mmol/L — ABNORMAL LOW (ref 135–145)

## 2022-05-15 MED ORDER — IPRATROPIUM-ALBUTEROL 0.5-2.5 (3) MG/3ML IN SOLN
3.0000 mL | Freq: Four times a day (QID) | RESPIRATORY_TRACT | Status: DC
  Administered 2022-05-15 – 2022-05-16 (×2): 3 mL via RESPIRATORY_TRACT
  Filled 2022-05-15 (×2): qty 3

## 2022-05-15 MED ORDER — PREDNISONE 20 MG PO TABS
40.0000 mg | ORAL_TABLET | Freq: Every day | ORAL | Status: DC
  Administered 2022-05-15 – 2022-05-17 (×3): 40 mg via ORAL
  Filled 2022-05-15 (×3): qty 2

## 2022-05-15 MED ORDER — MOMETASONE FURO-FORMOTEROL FUM 200-5 MCG/ACT IN AERO
2.0000 | INHALATION_SPRAY | Freq: Two times a day (BID) | RESPIRATORY_TRACT | Status: DC
  Administered 2022-05-16 – 2022-05-17 (×2): 2 via RESPIRATORY_TRACT
  Filled 2022-05-15: qty 8.8

## 2022-05-15 NOTE — Assessment & Plan Note (Signed)
Continue blood pressure monitoring  

## 2022-05-15 NOTE — Hospital Course (Addendum)
Mr. Chiu was admitted to the hospital with the working diagnosis of heart failure decompensation.   89 yo male with the past medical history of lung cancer 2017, coronary artery disease, sp CABG, hypertension, buttocks pressure ulcers, dyslipidemia, recurrent venous thromboembolism and dementia who presented with dyspnea. Recent hospitalization for heart failure 01/24 to 05/03/22. Reported 7 days of dyspnea, and decreased appetite. He was evaluated by his primary care on 02/06 and his furosemide dose was increased for volume overload. Unfortunately he had persistent worsening symptoms, called EMS and he was transported to the ED. On his initial physical examination his blood pressure is 110/52, HR 71, RR 30 and 02 saturation 93%, lungs with diffuse rhonchi, with bibasilar rales and increased work of breathing, heart with S1 and S2 present, irregular, positive murmur at the apex, abdomen with no distention, no lower extremity edema.   RSV positive.   Chest radiograph with cardiomegaly and bilateral hilar vascular congestion, small right pleural effusion.  EKG 67 bpm, normal axis, qtc manually corrected 449, atrial flutter with variable block rhythm with no significant ST segment or T wave changes.   Patient was placed on furosemide and supportive medical care.  Palliative care has been consulted.   02/12 respiratory status has been more stable today. He has high risk for hospitalization and will benefit from hospice. This has been discussed with his daughter and son at the bedside.

## 2022-05-15 NOTE — Assessment & Plan Note (Signed)
Urinary retention, continue with foley catheter. Om tamsulosin and finasteride.  Will go home with catheter and outpatient follow up with Urology.

## 2022-05-15 NOTE — Progress Notes (Signed)
   Palliative Medicine Inpatient Follow Up Note HPI: Evan [name] is a 89 y.o. male with medical history significant of lung cancer (2017), BPH, CAD s/p CABG, HTN, HLD, RA, dementia; and VTE on Eliquis presenting with SOB.  He was last hospitalized from 1/24-30 with acute on chronic systolic CHF and recurrent acute PE, restarted on Eliquis per hematology recommendation.   Patient being treated for HF exacerbation, urinary retention, and RSV.   Palliative care has been asked to get involved to further address goals of care, Evan [name] has had 3 IP admissions in the past 6 months. He was formerly followed as an OP by [employer] Prior DNR in 2020.    Today's Discussion 05/15/2022  *Please note that this is a verbal dictation therefore any spelling or grammatical errors are due to the "[place] [facility]" system interpretation.  Chart reviewed inclusive of vital signs, progress notes, laboratory results, and diagnostic images.   I spoke to patients RN, Katharine Look who endorsed no concerns regarding Evan [name]'s care this morning.   [name] was resting this morning upon assessment. He is back on RA. Not noted to exemplify and s/s of distress.   I spoke to patients daughter, [name] this morning. She asked some more questions about what home palliative care would consist of which I was able to answer. She is agreeable to this service.   Questions and concerns addressed/Palliative Support Provided.   Objective Assessment: Vital Signs Vitals:   05/15/22 0337 05/15/22 0730  BP: (!) 113/54 118/67  Pulse: 64 71  Resp: 20 20  Temp: 98 F (36.7 C) 98.1 F (36.7 C)  SpO2: 95% 96%    Intake/Output Summary (Last 24 hours) at 05/15/2022 1152 Last data filed at 05/15/2022 0411 Gross per 24 hour  Intake 240 ml  Output 1500 ml  Net -1260 ml    Last Weight  Most recent update: 05/15/2022  4:11 AM    Weight  67 kg (147 lb 11.3 oz)            Gen:  Elderly Caucasian M in NAD HEENT: moist mucous  membranes CV: Regular rate and rhythm  PULM:  On RA, breathing is even and nonlabored ABD: soft/nontender  EXT: No edema  Neuro: Alert to self  SUMMARY OF RECOMMENDATIONS   FULL CODE/FULL SCOPE OF CARE   Open and honest conversations held in the setting of patients disease process   Goals are to regain physical function   Appreciate TOC setting up OP Palliative support on discharge   Ongoing incremental PMT support  Billing based on MDM: Low _____________________________________________________________________________________ [name] Team Team Cell Phone: [phone] Please utilize secure chat with additional questions, if there is no response within 30 minutes please call the above phone number  Palliative Medicine Team providers are available by phone from 7am to 7pm daily and can be reached through the team cell phone.  Should this patient require assistance outside of these hours, please call the patient's attending physician.

## 2022-05-15 NOTE — Assessment & Plan Note (Signed)
Continue nutritional supplementation  

## 2022-05-15 NOTE — Assessment & Plan Note (Signed)
Continue telemetry monitoring and anticoagulation with apixaban.

## 2022-05-15 NOTE — Progress Notes (Signed)
Progress Note   Patient: Evan [name] YDX:[id number] DOB: Aug 13, 1927 DOA: 05/11/2022     2 DOS: the patient was seen and examined on 05/15/2022   Brief hospital course: Evan [name] was admitted to the hospital with the working diagnosis of heart failure decompensation.   89 yo male with the past medical history of lung cancer 2017, coronary artery disease, sp CABG, hypertension, buttocks pressure ulcers, dyslipidemia, recurrent venous thromboembolism and dementia who presented with dyspnea. Recent hospitalization for heart failure 01/24 to 05/03/22. Reported 7 days of dyspnea, and decreased appetite. He was evaluated by his primary care on 02/06 and his furosemide dose was increased for volume overload. Unfortunately he had persistent worsening symptoms, called EMS and he was transported to the ED. On his initial physical examination his blood pressure is 110/52, HR 71, RR 30 and 02 saturation 93%, lungs with diffuse rhonchi, with bibasilar rales and increased work of breathing, heart with S1 and S2 present, irregular, positive murmur at the apex, abdomen with no distention, no lower extremity edema.   RSV positive.   Chest radiograph with cardiomegaly and bilateral hilar vascular congestion, small right pleural effusion.  EKG 67 bpm, normal axis, qtc manually corrected 449, atrial flutter with variable block rhythm with no significant ST segment or T wave changes.   Patient was placed on furosemide and supportive medical care.  Palliative care has been consulted.      Assessment and Plan: * Acute on chronic diastolic CHF (congestive heart failure) (HCC) Echocardiogram wit preserved LV systolic function with EF 50 to 72%, RV systolic function with mild reduction, LA with moderate dilatation, RVSP 49.3   Urine output is 0947 cc Systolic blood pressure 096 to 132 mmHg.   Plan to transition to oral furosemide Continue blood pressure monitoring.   Acute gout left hand due to diuresis. Will  add oral prednisone.   COPD (chronic obstructive pulmonary disease) (HCC) Acute COPD exacerbation.  Acute RSV infection.  Continue supplemental 02 per [place] to keep 02 saturation 88% or grater. Bronchodilator therapy and airway clearing techniques. Add systemic and inhaled corticosteroids.    Chronic kidney disease, stage 3b ([place]) AKI with baseline serum cr 1,6 Hyponatremia  Renal function with serum cr at 1,41 with K at 3,5 and serum bicarbonate at 20. Na 133.   Plan to transition to oral furosemide. Follow up renal function in am.    BPH (benign prostatic hyperplasia) Urinary retention, continue with foley catheter. Om tamsulosin and finasteride.  Will go home with catheter and outpatient follow up with Urology.   Essential hypertension Continue blood pressure monitoring   S/P CABG (coronary artery bypass graft) No chest pain.   Hyperlipidemia LDL goal <70 Continue with rosuvastatin.   Pressure injury of skin Pressure Injury 04/28/22 Sacrum Mid Stage 1 -  Intact skin with non-blanchable redness of a localized area usually over a bony prominence. (Active)  04/28/22 2030  Location: Sacrum  Location Orientation: Mid  Staging: Stage 1 -  Intact skin with non-blanchable redness of a localized area usually over a bony prominence.  Wound Description (Comments):   Present on Admission: Yes  -- Zinc oxide -- Wound care following, continue frequent offloading, local wound care    Protein-calorie malnutrition, severe Continue nutritional supplementation   Pulmonary embolism (HCC) Continue anticoagulation with apixaban.   Permanent atrial fibrillation (HCC) Continue telemetry monitoring and anticoagulation with apixaban.         Subjective: Patient with increased work of breathing, he is having pain  in his right hand, po intake has improved some, but continue very weak and deconditioned   Physical Exam: Vitals:   05/14/22 2328 05/15/22 0337 05/15/22 0411 05/15/22  0730  BP: (!) 124/57 (!) 113/54  118/67  Pulse: 68 64  71  Resp: 20 20  20   Temp: 98.5 F (36.9 C) 98 F (36.7 C)  98.1 F (36.7 C)  TempSrc: Oral Oral  Oral  SpO2: 97% 95%  96%  Weight:   67 kg   Height:       Neurology awake and alert, deconditioned and ill looking appearing  ENT with pallor,  Cardiovascular with S1 and S2 present and irregular with no gallops Respiratory with rales and rhonchi, prolonged expiratory phase Abdomen with no distention  No lower extremity edema  Data Reviewed:    Family Communication: I spoke with patient's daughter at the bedside, we talked in detail about patient's condition, plan of care and prognosis and all questions were addressed. We spoke about hospice.    Disposition: Status is: Inpatient Remains inpatient appropriate because: respiratory failure   Planned Discharge Destination: Home    Author: Tawni Millers, MD 05/15/2022 3:52 PM  For on call review [email or web address].

## 2022-05-15 NOTE — Assessment & Plan Note (Signed)
Continue anticoagulation with apixaban.  ?

## 2022-05-15 NOTE — Assessment & Plan Note (Addendum)
Echocardiogram wit preserved LV systolic function with EF 50 to 83%, RV systolic function with mild reduction, LA with moderate dilatation, RVSP 49.3   Urine output is 291  cc Systolic blood pressure 916 to 121 mmHg.   Continue with oral furosemide Continue blood pressure monitoring.   Acute gout left hand due to diuresis. Right hand pain improved.

## 2022-05-15 NOTE — Assessment & Plan Note (Signed)
Pressure Injury 04/28/22 Sacrum Mid Stage 1 -  Intact skin with non-blanchable redness of a localized area usually over a bony prominence. (Active)  04/28/22 2030  Location: Sacrum  Location Orientation: Mid  Staging: Stage 1 -  Intact skin with non-blanchable redness of a localized area usually over a bony prominence.  Wound Description (Comments):   Present on Admission: Yes  -- Zinc oxide -- Wound care following, continue frequent offloading, local wound care

## 2022-05-15 NOTE — Assessment & Plan Note (Signed)
Continue with rosuvastatin.

## 2022-05-15 NOTE — Assessment & Plan Note (Signed)
No chest pain

## 2022-05-15 NOTE — Assessment & Plan Note (Signed)
Acute COPD exacerbation.  Acute RSV infection.  Continue supplemental 02 per [place] to keep 02 saturation 88% or grater. Bronchodilator therapy and airway clearing techniques. Continue with systemic and inhaled corticosteroids.   Decrease rhonchi today.

## 2022-05-15 NOTE — Assessment & Plan Note (Addendum)
AKI with baseline serum cr 1,6 Hyponatremia  At the time of his discharge his renal function a a serum cr of 1,5 with K at 3,5 and serum bicarbonate at 19. Na is 133.   Continue with oral furosemide.  Follow up renal function in 7 days as outpatient.

## 2022-05-16 DIAGNOSIS — E785 Hyperlipidemia, unspecified: Secondary | ICD-10-CM | POA: Diagnosis not present

## 2022-05-16 DIAGNOSIS — I4821 Permanent atrial fibrillation: Secondary | ICD-10-CM

## 2022-05-16 DIAGNOSIS — I10 Essential (primary) hypertension: Secondary | ICD-10-CM | POA: Diagnosis not present

## 2022-05-16 DIAGNOSIS — C3491 Malignant neoplasm of unspecified part of right bronchus or lung: Secondary | ICD-10-CM

## 2022-05-16 DIAGNOSIS — I5033 Acute on chronic diastolic (congestive) heart failure: Secondary | ICD-10-CM | POA: Diagnosis not present

## 2022-05-16 DIAGNOSIS — J438 Other emphysema: Secondary | ICD-10-CM | POA: Diagnosis not present

## 2022-05-16 LAB — BASIC METABOLIC PANEL
Anion gap: 10 (ref 5–15)
BUN: 34 mg/dL — ABNORMAL HIGH (ref 8–23)
CO2: 21 mmol/L — ABNORMAL LOW (ref 22–32)
Calcium: 8.5 mg/dL — ABNORMAL LOW (ref 8.9–10.3)
Chloride: 101 mmol/L (ref 98–111)
Creatinine, Ser: 1.47 mg/dL — ABNORMAL HIGH (ref 0.61–1.24)
GFR, Estimated: 44 mL/min — ABNORMAL LOW (ref >60)
Glucose, Bld: 204 mg/dL — ABNORMAL HIGH (ref 70–99)
Potassium: 3.9 mmol/L (ref 3.5–5.1)
Sodium: 132 mmol/L — ABNORMAL LOW (ref 135–145)

## 2022-05-16 MED ORDER — FUROSEMIDE 20 MG PO TABS: 20.0000 mg | ORAL_TABLET | Freq: Every day | ORAL | Status: DC

## 2022-05-16 MED ORDER — IPRATROPIUM-ALBUTEROL 0.5-2.5 (3) MG/3ML IN SOLN
3.0000 mL | Freq: Two times a day (BID) | RESPIRATORY_TRACT | Status: DC
  Administered 2022-05-16 – 2022-05-17 (×2): 3 mL via RESPIRATORY_TRACT
  Filled 2022-05-16 (×2): qty 3

## 2022-05-16 NOTE — Progress Notes (Signed)
Physical Therapy Treatment Patient Details Name: Evan [name] MRN: [id number] DOB: 28-Jun-1927 Today's Date: 05/16/2022   History of Present Illness 89 yo male presents [facility] on 2/7 with ShOB and decreased urine output, +RSV and HF exacerbation. Recent hospitalization 1/24-1/30 with newly diagnosed PE and CHF exacerbation. 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    PT Comments    Pt greeted supine in bed and agreeable to session, however pt noted to require increased assist for all mobility this date secondary to increased fatigue. Pt able to come to sitting EOB with mod a and maintain sitting balance with up to min assist during dynamic tasks with RLE. Pt requiring max A +2 for standing and transfer EOB>BSC. Pt son present with hands on assist and supportive throughout session, and reports although pt does require 2 person assist at baseline, he does not normally require as much as this date. Pt continues to benefit from skilled PT services to progress toward functional mobility goals.    Recommendations for follow up therapy are one component of a multi-disciplinary discharge planning process, led by the attending physician.  Recommendations may be updated based on patient status, additional functional criteria and insurance authorization.  Follow Up Recommendations  Home health PT     Assistance Recommended at Discharge Frequent or constant Supervision/Assistance  Patient can return home with the following A little help with bathing/dressing/bathroom;Assistance with cooking/housework;Assist for transportation;Help with stairs or ramp for entrance;Direct supervision/assist for medications management;Direct supervision/assist for financial management;A  lot of help with walking and/or transfers   Equipment Recommendations  None recommended by PT    Recommendations for Other Services       Precautions / Restrictions Precautions Precautions: Fall Precaution Comments: residual R hemiparesis Restrictions Weight Bearing Restrictions: No     Mobility  Bed Mobility Overal bed mobility: Needs Assistance Bed Mobility: Supine to Sit   Sidelying to sit: Mod assist       General bed mobility comments: assist for trunk elevation, LE progression, and scooting forward with assist of bed pad    Transfers Overall transfer level: Needs assistance Equipment used: 2 person hand held assist Transfers: Sit to/from Stand, Bed to chair/wheelchair/BSC Sit to Stand: Mod assist, +2 physical assistance Stand pivot transfers: Max assist, +2 physical assistance         General transfer comment: mod +2 for power up, rise, steadying x2 during session, max a +2 to pivot to [facility], unable to advance R foot with ankle beginning to roll, unable to elevate trunk to come to sull upright standing this date.    Ambulation/Gait               General Gait Details: unable 2/2to fatigue   Stairs             Wheelchair Mobility    Modified Rankin (Stroke Patients Only)       Balance Overall balance assessment: Needs assistance Sitting-balance support: No upper extremity supported, Feet supported Sitting balance-[name] Scale: Fair Sitting balance - Comments: fair to poor, posterior bias Postural control: Posterior lean, Right lateral lean Standing balance support: During functional activity, Bilateral upper extremity supported Standing balance-[name] Scale: Poor Standing balance comment: reliant on PTA and OT assist  Cognition Arousal/Alertness: Awake/alert Behavior During Therapy: WFL for tasks assessed/performed Overall Cognitive Status: Impaired/Different from baseline Area of Impairment:  Following commands, Safety/judgement, Problem solving                       Following Commands: Follows one step commands with increased time, Follows one step commands consistently Safety/Judgement: Decreased awareness of safety   Problem Solving: Difficulty sequencing, Requires verbal cues, Requires tactile cues, Decreased initiation, Slow processing General Comments: increased fatigue this date, needing cues to keep eyes open at start, improving with time up EOB        Exercises Other Exercises Other Exercises: seated LE warm up therex, LAQ x10 ea side, seated marching x10    General Comments General comments (skin integrity, edema, etc.): VSS on RA. Son present throughout      Pertinent Vitals/Pain Pain Assessment Pain Assessment: No/denies pain    Home Living                          Prior Function            PT Goals (current goals can now be found in the care plan section) Acute Rehab PT Goals PT Goal Formulation: With patient/family Time For Goal Achievement: 05/26/22 Progress towards PT goals: Not progressing toward goals - comment    Frequency    Min 3X/week      PT Plan Current plan remains appropriate    Co-evaluation PT/OT/SLP Co-Evaluation/Treatment: Yes Reason for Co-Treatment: For patient/therapist safety;To address functional/ADL transfers PT goals addressed during session: Mobility/safety with mobility;Balance        AM-PAC PT "6 Clicks" Mobility   Outcome Measure  Help needed turning from your back to your side while in a flat bed without using bedrails?: A Little Help needed moving from lying on your back to sitting on the side of a flat bed without using bedrails?: A Lot Help needed moving to and from a bed to a chair (including a wheelchair)?: Total Help needed standing up from a chair using your arms (e.g., wheelchair or bedside chair)?: Total Help needed to walk in hospital room?: Total Help needed climbing 3-5  steps with a railing? : Total 6 Click Score: 9    End of Session Equipment Utilized During Treatment: Gait belt Activity Tolerance: Patient tolerated treatment well;Patient limited by fatigue Patient left: with family/visitor present;in chair;with call bell/phone within reach Nurse Communication: Mobility status PT Visit Diagnosis: Unsteadiness on feet (R26.81);Muscle weakness (generalized) (M62.81);Difficulty in walking, not elsewhere classified (R26.2) Hemiplegia - Right/Left: Right Hemiplegia - dominant/non-dominant: Dominant Hemiplegia - caused by:  (chronic)     Time: 7262-0355 PT Time Calculation (min) (ACUTE ONLY): 35 min  Charges:  $Therapeutic Activity: 8-22 mins                     Lonn Im R. PTA Acute Rehabilitation Services Office: [place] 05/16/2022, 12:22 PM

## 2022-05-16 NOTE — Progress Notes (Addendum)
Occupational Therapy Treatment Patient Details Name: Evan [name] MRN: [id number] DOB: [birth year] Today's Date: 05/16/2022   History of present illness 89 yo male presents to [facility] on 2/7 with ShOB and decreased urine output, +RSV and HF exacerbation. Recent hospitalization 1/24-1/30 with newly diagnosed PE and CHF exacerbation. 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   OT comments  Pt agreeable for therapy session though noted to require increased physical assistance and fatigued much more quickly than on initial OT eval. Overall, pt requires Max A x 2 for [facility] transfer, Total A x 2 for posterior hygiene in standing. Pt requires cues throughout for pursed lip breathing to decrease RR though SpO2 sustained > 97% on RA during session. Pt' son present, hands on to assist; reports two people typically needed for toileting hygiene though pt typically does not usually require this much assist to pivot. Pt's son also reports that pt remained in bed over the weekend.   Recommendations for follow up therapy are one component of a multi-disciplinary discharge planning process, led by the attending physician.  Recommendations may be updated based on patient status, additional functional criteria and insurance authorization.    Follow Up Recommendations  Home health OT     Assistance Recommended at Discharge Frequent or constant Supervision/Assistance  Patient can return home with the following  Assistance with cooking/housework;Direct supervision/assist for medications management;Direct supervision/assist for financial management;Assist for transportation;Help with stairs or ramp for entrance;Two people to help with walking and/or transfers;Two people to help with  bathing/dressing/bathroom   Equipment Recommendations  None recommended by OT    Recommendations for Other Services      Precautions / Restrictions Precautions Precautions: Fall Precaution Comments: residual R hemiparesis Restrictions Weight Bearing Restrictions: No       Mobility Bed Mobility Overal bed mobility: Needs Assistance Bed Mobility: Supine to Sit   Sidelying to sit: Mod assist       General bed mobility comments: assist for trunk elevation, LE progression, and scooting forward with assist of bed pad    Transfers Overall transfer level: Needs assistance Equipment used: 2 person hand held assist Transfers: Sit to/from Stand, Bed to chair/wheelchair/BSC Sit to Stand: Mod assist, +2 physical assistance Stand pivot transfers: Max assist, +2 physical assistance         General transfer comment: mod +2 for power up, rise, steadying x2 during session, max a +2 to pivot to [facility], unable to advance R foot with ankle beginning to roll, unable to elevate trunk to come to sull upright standing this date.     Balance Overall balance assessment: Needs assistance Sitting-balance support: No upper extremity supported, Feet supported Sitting balance-[name] Scale: Fair Sitting balance - Comments: fair to poor, posterior bias Postural control: Posterior lean, Right lateral lean Standing balance support: During functional activity, Bilateral upper extremity supported Standing balance-[name] Scale: Poor Standing balance comment: reliant on PTA and OT assist                           ADL either performed or assessed with clinical judgement   ADL Overall ADL's : Needs assistance/impaired     Grooming: Minimal assistance;Sitting Grooming Details (indicate cue type and reason): assist for shampoo cap with son assisting  in combing pt hair afterwards Upper Body Bathing: Moderate assistance Upper Body Bathing Details (indicate cue type and reason): assist for back              Toilet Transfer: Maximal assistance;+2 for physical assistance;+2 for safety/equipment;Stand-pivot;BSC/3in1 Toilet Transfer Details (indicate cue type and reason): Max A x 2 to pivot to BSC, noted R ankle instability, posterior bias and quick fatigue Toileting- Clothing Manipulation and Hygiene: Total assistance;+2 for physical assistance;+2 for safety/equipment;Sit to/from stand Toileting - Clothing Manipulation Details (indicate cue type and reason): assist for balance in standing while second person assisted with hygiene       General ADL Comments: Pt with increased fatigue and requiring increased assist for transfers and unable to mobilize as pt did on initial OT eval    Extremity/Trunk Assessment Upper Extremity Assessment Upper Extremity Assessment: RUE deficits/detail RUE Deficits / Details: uncoordinated movement, RUE shoulder flex ~90*, can flex/ext elbow, 2/5 grasp; tendency to rest in flexed position but able to stretch functionally RUE Coordination: decreased fine motor;decreased gross motor   Lower Extremity Assessment Lower Extremity Assessment: Defer to PT evaluation        Vision   Vision Assessment?: No apparent visual deficits   Perception     Praxis      Cognition Arousal/Alertness: Awake/alert Behavior During Therapy: WFL for tasks assessed/performed Overall Cognitive Status: Impaired/Different from baseline Area of Impairment: Following commands, Safety/judgement, Problem solving                       Following Commands: Follows one step commands with increased time, Follows one step commands consistently Safety/Judgement: Decreased awareness of safety   Problem Solving: Difficulty sequencing, Requires verbal cues, Requires tactile cues, Decreased initiation, Slow processing General Comments: increased fatigue this date, needing cues to keep eyes open at start, improving with time up EOB        Exercises      Shoulder  Instructions       General Comments Son present, hands on to assist    Pertinent Vitals/ Pain       Pain Assessment Pain Assessment: No/denies pain  Home Living                                          Prior Functioning/Environment              Frequency  Min 2X/week        Progress Toward Goals  OT Goals(current goals can now be found in the care plan section)  Progress towards OT goals: OT to reassess next treatment  Acute Rehab OT Goals Patient Stated Goal: stop itching OT Goal Formulation: With patient Time For Goal Achievement: 05/26/22 Potential to Achieve Goals: Good ADL Goals Pt Will Perform Eating: with set-up;sitting Pt Will Perform Grooming: sitting;with min assist Pt Will Perform Upper Body Dressing: sitting;with min assist Pt Will Transfer to Toilet: with min assist;with +2 assist;ambulating;stand pivot transfer Additional ADL Goal #1: Family to demonstrate Independent ability to assist pt with ADLs/mobility with optimal safety and task modification as needed.  Plan Discharge plan remains appropriate;Frequency remains appropriate    Co-evaluation      Reason for Co-Treatment: For patient/therapist safety;To address functional/ADL transfers PT goals addressed during session: Mobility/safety with mobility;Balance OT goals addressed during session: ADL's and self-care;Strengthening/ROM      AM-PAC OT "6 Clicks" Daily  Activity     Outcome Measure   Help from another person eating meals?: A Little Help from another person taking care of personal grooming?: A Little Help from another person toileting, which includes using toliet, bedpan, or urinal?: A Lot Help from another person bathing (including washing, rinsing, drying)?: A Lot Help from another person to put on and taking off regular upper body clothing?: A Lot Help from another person to put on and taking off regular lower body clothing?: A Lot 6 Click Score: 14    End  of Session Equipment Utilized During Treatment: Gait belt  OT Visit Diagnosis: Muscle weakness (generalized) (M62.81);Unsteadiness on feet (R26.81);Other abnormalities of gait and mobility (R26.89)   Activity Tolerance Patient limited by fatigue   Patient Left in chair;with call bell/phone within reach;with family/visitor present (noted chair alarm pad was rolled up on floor; family present during pt stay)   Nurse Communication Mobility status        Time: 9381-0175 OT Time Calculation (min): 36 min  Charges: OT General Charges $OT Visit: 1 Visit OT Treatments $Self Care/Home Management : 8-22 mins  Malachy Chamber, OTR/L Acute Rehab Services Office: [phone]   Layla Maw 05/16/2022, 12:44 PM

## 2022-05-16 NOTE — Progress Notes (Signed)
OT Cancellation Note  Patient Details Name: Evan [name] MRN: [id number] DOB: [birth year]   Cancelled Treatment:    Reason Eval/Treat Not Completed: Other (comment) Pt currently eating breakfast with family assistance. Will follow up for OT session as schedule permits.  Layla Maw 05/16/2022, 10:17 AM

## 2022-05-16 NOTE — Progress Notes (Signed)
Progress Note   Patient: Evan [name] YHC:[id number] DOB: 11-22-1927 DOA: 05/11/2022     3 DOS: the patient was seen and examined on 05/16/2022   Brief hospital course: Mr. Seymour was admitted to the hospital with the working diagnosis of heart failure decompensation.   89 yo male with the past medical history of lung cancer 2017, coronary artery disease, sp CABG, hypertension, buttocks pressure ulcers, dyslipidemia, recurrent venous thromboembolism and dementia who presented with dyspnea. Recent hospitalization for heart failure 01/24 to 05/03/22. Reported 7 days of dyspnea, and decreased appetite. He was evaluated by his primary care on 02/06 and his furosemide dose was increased for volume overload. Unfortunately he had persistent worsening symptoms, called EMS and he was transported to the ED. On his initial physical examination his blood pressure is 110/52, HR 71, RR 30 and 02 saturation 93%, lungs with diffuse rhonchi, with bibasilar rales and increased work of breathing, heart with S1 and S2 present, irregular, positive murmur at the apex, abdomen with no distention, no lower extremity edema.   RSV positive.   Chest radiograph with cardiomegaly and bilateral hilar vascular congestion, small right pleural effusion.  EKG 67 bpm, normal axis, qtc manually corrected 449, atrial flutter with variable block rhythm with no significant ST segment or T wave changes.   Patient was placed on furosemide and supportive medical care.  Palliative care has been consulted.   02/12 respiratory status has been more stable today. He has high risk for hospitalization and will benefit from hospice. This has been discussed with his daughter and son at the bedside.   Assessment and Plan: * Acute on chronic diastolic CHF (congestive heart failure) (HCC) Echocardiogram wit preserved LV systolic function with EF 50 to 51%, RV systolic function with mild reduction, LA with moderate dilatation, RVSP 49.3   Urine  output is 761  cc Systolic blood pressure 607 to 121 mmHg.   Continue with oral furosemide Continue blood pressure monitoring.   Acute gout left hand due to diuresis. Right hand pain improved.   COPD (chronic obstructive pulmonary disease) (HCC) Acute COPD exacerbation.  Acute RSV infection.  Continue supplemental 02 per [place] to keep 02 saturation 88% or grater. Bronchodilator therapy and airway clearing techniques. Continue with systemic and inhaled corticosteroids.   Decrease rhonchi today.   Chronic kidney disease, stage 3b ([place]) AKI with baseline serum cr 1,6 Hyponatremia  Renal function with serum cr at 1,47 with K at 3,9 and serum bicarbonate at 21.  Na 132  Continue with oral furosemide.  Follow up renal function in am.   BPH (benign prostatic hyperplasia) Urinary retention, continue with foley catheter. Om tamsulosin and finasteride.  Will go home with catheter and outpatient follow up with Urology.   Essential hypertension Continue blood pressure monitoring   S/P CABG (coronary artery bypass graft) No chest pain.   Hyperlipidemia LDL goal <70 Continue with rosuvastatin.   Pressure injury of skin Pressure Injury 04/28/22 Sacrum Mid Stage 1 -  Intact skin with non-blanchable redness of a localized area usually over a bony prominence. (Active)  04/28/22 2030  Location: Sacrum  Location Orientation: Mid  Staging: Stage 1 -  Intact skin with non-blanchable redness of a localized area usually over a bony prominence.  Wound Description (Comments):   Present on Admission: Yes  -- Zinc oxide -- Wound care following, continue frequent offloading, local wound care    Protein-calorie malnutrition, severe Continue nutritional supplementation   Pulmonary embolism (HCC) Continue anticoagulation with apixaban.  Permanent atrial fibrillation (HCC) Continue telemetry monitoring and anticoagulation with apixaban.         Subjective: Patient with less  respiratory distress compared to yesterday, he continue to be very weak and dyspnea at rest.   Physical Exam: Vitals:   05/15/22 2314 05/16/22 0354 05/16/22 0800 05/16/22 0852  BP: 116/71 (!) 109/57 121/66   Pulse: 70 66 (!) 59   Resp: 20 20 20    Temp: 98.9 F (37.2 C)  97.7 F (36.5 C)   TempSrc: Oral  Axillary   SpO2: 100% 99% 100% 100%  Weight:      Height:       Neurology awake and alert,. Deconditioned and ill looking appearing,. Somnolent but easy to arouse. Decreased hearing ENT with mild pallor Cardiovascular with S1 and S2 present and rhythmic with no gallops or rubs, positive systolic murmur at the right sternal border Respiratory with decreased and distant breath sounds with prolonged expiratory phase, with no wheezing or rhonchi with no rales Abdomen with no distention  Data Reviewed:    Family Communication: I spoke with patient's daughter and son at the bedside, we talked in detail about patient's condition, plan of care and prognosis and all questions were addressed. We talked about hospice.    Disposition: Status is: Inpatient Remains inpatient appropriate because: possible dc home tomorrow. Possible addition of hospice pending patient's family decision.   Planned Discharge Destination: Home      Author: Tawni Millers, MD 05/16/2022 1:10 PM  For on call review [email or web address].

## 2022-05-16 NOTE — Care Management Important Message (Signed)
Important Message  Patient Details  Name: Evan [name] MRN: [id number] Date of Birth: September 11, 1927   Medicare Important Message Given:  Yes     Shelda Altes 05/16/2022, 9:56 AM

## 2022-05-17 ENCOUNTER — Other Ambulatory Visit (HOSPITAL_COMMUNITY): Payer: Self-pay

## 2022-05-17 DIAGNOSIS — N138 Other obstructive and reflux uropathy: Secondary | ICD-10-CM

## 2022-05-17 DIAGNOSIS — N183 Chronic kidney disease, stage 3 unspecified: Secondary | ICD-10-CM

## 2022-05-17 DIAGNOSIS — N401 Enlarged prostate with lower urinary tract symptoms: Secondary | ICD-10-CM | POA: Diagnosis not present

## 2022-05-17 DIAGNOSIS — E43 Unspecified severe protein-calorie malnutrition: Secondary | ICD-10-CM

## 2022-05-17 DIAGNOSIS — Z86718 Personal history of other venous thrombosis and embolism: Secondary | ICD-10-CM

## 2022-05-17 DIAGNOSIS — I5033 Acute on chronic diastolic (congestive) heart failure: Secondary | ICD-10-CM | POA: Diagnosis not present

## 2022-05-17 DIAGNOSIS — E785 Hyperlipidemia, unspecified: Secondary | ICD-10-CM | POA: Diagnosis not present

## 2022-05-17 DIAGNOSIS — N1832 Chronic kidney disease, stage 3b: Secondary | ICD-10-CM

## 2022-05-17 DIAGNOSIS — C3491 Malignant neoplasm of unspecified part of right bronchus or lung: Secondary | ICD-10-CM | POA: Insufficient documentation

## 2022-05-17 DIAGNOSIS — F02818 Dementia in other diseases classified elsewhere, unspecified severity, with other behavioral disturbance: Secondary | ICD-10-CM | POA: Insufficient documentation

## 2022-05-17 DIAGNOSIS — K219 Gastro-esophageal reflux disease without esophagitis: Secondary | ICD-10-CM | POA: Insufficient documentation

## 2022-05-17 DIAGNOSIS — F0390 Unspecified dementia without behavioral disturbance: Secondary | ICD-10-CM | POA: Insufficient documentation

## 2022-05-17 DIAGNOSIS — Z955 Presence of coronary angioplasty implant and graft: Secondary | ICD-10-CM | POA: Insufficient documentation

## 2022-05-17 DIAGNOSIS — F0280 Dementia in other diseases classified elsewhere without behavioral disturbance: Secondary | ICD-10-CM | POA: Insufficient documentation

## 2022-05-17 DIAGNOSIS — J438 Other emphysema: Secondary | ICD-10-CM | POA: Diagnosis not present

## 2022-05-17 DIAGNOSIS — I13 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 failure and stage 1 through stage 4 chronic kidney disease, or unspecified chronic kidney disease: Secondary | ICD-10-CM | POA: Insufficient documentation

## 2022-05-17 LAB — BASIC METABOLIC PANEL
Anion gap: 11 (ref 5–15)
BUN: 47 mg/dL — ABNORMAL HIGH (ref 8–23)
CO2: 19 mmol/L — ABNORMAL LOW (ref 22–32)
Calcium: 8.9 mg/dL (ref 8.9–10.3)
Chloride: 103 mmol/L (ref 98–111)
Creatinine, Ser: 1.55 mg/dL — ABNORMAL HIGH (ref 0.61–1.24)
GFR, Estimated: 42 mL/min — ABNORMAL LOW (ref >60)
Glucose, Bld: 178 mg/dL — ABNORMAL HIGH (ref 70–99)
Potassium: 3.5 mmol/L (ref 3.5–5.1)
Sodium: 133 mmol/L — ABNORMAL LOW (ref 135–145)

## 2022-05-17 MED ORDER — MOMETASONE FURO-FORMOTEROL FUM 200-5 MCG/ACT IN AERO
2.0000 | INHALATION_SPRAY | Freq: Two times a day (BID) | RESPIRATORY_TRACT | 0 refills | Status: DC
  Filled 2022-05-17: qty 13, 30d supply, fill #0

## 2022-05-17 MED ORDER — ALBUTEROL SULFATE (2.5 MG/3ML) 0.083% IN NEBU
2.5000 mg | INHALATION_SOLUTION | RESPIRATORY_TRACT | 12 refills | Status: DC | PRN
  Filled 2022-05-17: qty 75, 5d supply, fill #0

## 2022-05-17 MED ORDER — ADULT MULTIVITAMIN W/MINERALS CH
1.0000 | ORAL_TABLET | Freq: Every day | ORAL | 0 refills | Status: AC
  Filled 2022-05-17: qty 30, 30d supply, fill #0

## 2022-05-17 MED ORDER — BUDESONIDE-FORMOTEROL FUMARATE 160-4.5 MCG/ACT IN AERO
2.0000 | INHALATION_SPRAY | Freq: Two times a day (BID) | RESPIRATORY_TRACT | 12 refills | Status: DC
  Filled 2022-05-17 (×2): qty 10.2, 30d supply, fill #0
  Filled 2022-05-17: qty 1, fill #0
  Filled 2022-07-29: qty 10.2, 30d supply, fill #1
  Filled 2022-08-18: qty 10.2, 30d supply, fill #2
  Filled 2022-11-28: qty 10.2, 30d supply, fill #3
  Filled 2022-12-31: qty 10.2, 30d supply, fill #4
  Filled 2023-02-09: qty 10.2, 30d supply, fill #5
  Filled 2023-03-15: qty 10.2, 30d supply, fill #6
  Filled 2023-04-23: qty 10.2, 30d supply, fill #7

## 2022-05-17 MED ORDER — FUROSEMIDE 40 MG PO TABS: 40.0000 mg | ORAL_TABLET | Freq: Every day | ORAL | 0 refills | Status: DC

## 2022-05-17 MED ORDER — ENSURE ENLIVE PO LIQD
237.0000 mL | Freq: Two times a day (BID) | ORAL | 12 refills | Status: DC
  Filled 2022-05-17: qty 237, 1d supply, fill #0

## 2022-05-17 MED ORDER — PREDNISONE 20 MG PO TABS
20.0000 mg | ORAL_TABLET | Freq: Every day | ORAL | 0 refills | Status: AC
  Filled 2022-05-17: qty 5, 5d supply, fill #0

## 2022-05-17 MED ORDER — GUAIFENESIN 100 MG/5ML PO LIQD
5.0000 mL | ORAL | 0 refills | Status: DC | PRN
  Filled 2022-05-17: qty 118, 4d supply, fill #0

## 2022-05-17 NOTE — Progress Notes (Signed)
Pt/family given discharge instructions, medication lists, follow up appointments, and when to call the doctor.  Pt/family verbalizes understanding. Pt home with foley. Family instructed to clean and empty. [facility] services to assist with home needs. Family aware that nebulizer machine to be delivered to home. Medications sent to outpatient pharmacy and family aware. Payton Emerald, RN

## 2022-05-17 NOTE — Discharge Summary (Addendum)
Physician Discharge Summary   Patient: Evan [name] MRN: [id number] DOB: 05-07-1927  Admit date:     05/11/2022  Discharge date: 05/17/22  Discharge Physician: Jimmy Picket [facility] [place]   PCP: [name], MD   Recommendations at discharge:    Continue bronchodilator therapy with Douneb Systemic corticosteroids with oral prednisone 20 mg for 5 more days. Antitussive agents and airway clearing techniques with flutter valve and incentive spirometer. Continue diuresis with furosemide 40 mg po daily. Follow up renal function in 7 days. Follow up with Dr Dennard Schaumann in 7 to 10 days.  Patient is being discharged home with hospice services.  Follow up with Urology as outpatient for foley catheter.   Discharge Diagnoses: Principal Problem:   Acute on chronic diastolic CHF (congestive heart failure) (HCC) Active Problems:   COPD (chronic obstructive pulmonary disease) (HCC)   Chronic kidney disease, stage 3b (HCC)   Essential hypertension   BPH (benign prostatic hyperplasia)   Hyperlipidemia LDL goal <70   S/P CABG (coronary artery bypass graft)   Pressure injury of skin   Protein-calorie malnutrition, severe   Pulmonary embolism (HCC)   Adenocarcinoma of right lung (HCC)   Permanent atrial fibrillation ([place])  Resolved Problems:   * No resolved hospital problems. [facility] Course: [name] was admitted to the hospital with the working diagnosis of heart failure decompensation in the setting of RSV infection.   89 yo male with the past medical history of lung cancer 2017, coronary artery disease, sp CABG, hypertension, buttocks pressure ulcers, dyslipidemia, recurrent venous thromboembolism and dementia who presented with dyspnea. Recent hospitalization for heart failure 01/24 to 05/03/22. Reported 7 days of dyspnea, and decreased appetite. He was evaluated by his primary care on 02/06 and his furosemide dose was increased for volume overload. Unfortunately he had persistent worsening  symptoms, called EMS and he was transported to the ED. On his initial physical examination his blood pressure is 110/52, HR 71, RR 30 and 02 saturation 93%, lungs with diffuse rhonchi, with bibasilar rales and increased work of breathing, heart with S1 and S2 present, irregular, positive murmur at the apex, abdomen with no distention, no lower extremity edema.   Na 134, K 3,9 CL 100, bicarbonate 23, glucose 86 bun 31 cr 1,50  High sensitive troponin 23 to 26 Wbc 4,7 hgb 9,7 plt 130   Urine analysis with SG 1,010, negative protein and negative leukocytes.   RSV positive.   Chest radiograph with cardiomegaly and bilateral hilar vascular congestion, small right pleural effusion.  EKG 67 bpm, normal axis, qtc manually corrected 449, atrial flutter with variable block rhythm with no significant ST segment or T wave changes.   Patient was placed on furosemide and supportive medical care.  Palliative care has been consulted.   02/12 respiratory status has been more stable today. He has high risk for hospitalization and will benefit from hospice. This has been discussed with his daughter and son at the bedside.  02/13 Patient will be discharged home today. Family will talk to hospice for possible adding this services for his care.   Assessment and Plan: * Acute on chronic diastolic CHF (congestive heart failure) (HCC) Echocardiogram wit preserved LV systolic function with EF 50 to 38%, RV systolic function with mild reduction, LA with moderate dilatation, RVSP 49.3   Patient was placed on IV furosemide for diuresis, negative fluid balance was achieved, -4,950 ml, with improvement in his symptoms.   Patient will continue daily furosemide 40 mg and  will need outpatient follow up.   Acute gout left hand due to diuresis. Right hand pain improved.   COPD (chronic obstructive pulmonary disease) (HCC) Acute COPD exacerbation.  Acute RSV infection.  Bronchodilator therapy and airway clearing  techniques. Continue with inhaled corticosteroids.   Plan to continue with oral prednisone for 5 more days. \ His 02 saturation on room air is 100% at rest. Will benefit from supplemental 02 for comfort. Will have hospice team talk to patient's family.  His prognosis is poor due to advance age, debility and multiple medical problems.  Recurrent hospitalizations.     Chronic kidney disease, stage 3b ([place]) AKI with baseline serum cr 1,6 Hyponatremia  At the time of his discharge his renal function a a serum cr of 1,5 with K at 3,5 and serum bicarbonate at 19. Na is 133.   Continue with oral furosemide.  Follow up renal function in 7 days as outpatient.   BPH (benign prostatic hyperplasia) Urinary retention, continue with foley catheter. Om tamsulosin and finasteride.  Will go home with catheter and outpatient follow up with Urology.   Essential hypertension Continue blood pressure monitoring   S/P CABG (coronary artery bypass graft) No chest pain.   Hyperlipidemia LDL goal <70 Continue with rosuvastatin.   Pressure injury of skin Pressure Injury 04/28/22 Sacrum Mid Stage 1 -  Intact skin with non-blanchable redness of a localized area usually over a bony prominence. (Active)  04/28/22 2030  Location: Sacrum  Location Orientation: Mid  Staging: Stage 1 -  Intact skin with non-blanchable redness of a localized area usually over a bony prominence.  Wound Description (Comments):   Present on Admission: Yes  -- Zinc oxide -- Wound care following, continue frequent offloading, local wound care    Protein-calorie malnutrition, severe Continue nutritional supplementation   Pulmonary embolism (HCC) Continue anticoagulation with apixaban.   Permanent atrial fibrillation (HCC) Continue telemetry monitoring and anticoagulation with apixaban.         Consultants: palliative care  Procedures performed: none   Disposition: Home Diet recommendation:  Discharge Diet  Orders (From admission, onward)     Start     Ordered   05/17/22 0000  Diet - low sodium heart healthy        05/17/22 1159           Regular diet DISCHARGE MEDICATION: Allergies as of 05/17/2022       Reactions   Feldene [piroxicam] Rash   Blistering rash   Neomycin-polymyxin-hc Itching   Statins Other (See Comments)   Myalgias   Valium [diazepam] Other (See Comments)   Dizziness    Latex Rash   Tequin [gatifloxacin] Anxiety   Vibra-tab [doxycycline] Nausea Only        Medication List     STOP taking these medications    amoxicillin-clavulanate 875-125 MG tablet Commonly known as: AUGMENTIN   metoprolol tartrate 25 MG tablet Commonly known as: LOPRESSOR   Rivaroxaban 15 MG Tabs tablet Commonly known as: XARELTO       TAKE these medications    acetaminophen 500 MG tablet Commonly known as: TYLENOL Take 500 mg by mouth 2 (two) times daily as needed for mild pain, moderate pain, fever or headache.   albuterol (2.5 MG/3ML) 0.083% nebulizer solution Commonly known as: PROVENTIL Take 3 mLs (2.5 mg total) by nebulization every 4 (four) hours as needed for wheezing or shortness of breath.   budesonide-formoterol 160-4.5 MCG/ACT inhaler Commonly known as: Symbicort Inhale 2 puffs into the  lungs in the morning and at bedtime.   cyanocobalamin 1000 MCG tablet Take 1 tablet (1,000 mcg total) by mouth daily.   Eliquis 2.5 MG Tabs tablet Generic drug: apixaban Take 1 tablet (2.5 mg total) by mouth 2 (two) times daily.   FeroSul 325 (65 FE) MG tablet Generic drug: ferrous sulfate Take 1 tablet (325 mg total) by mouth every other day.   finasteride 5 MG tablet Commonly known as: PROSCAR Take 1 tablet (5 mg total) by mouth daily.   FLUoxetine 20 MG capsule Commonly known as: PROZAC TAKE 1 CAPSULE EVERY DAY What changed: when to take this   folic acid 1 MG tablet Commonly known as: FOLVITE Take 1 tablet (1 mg total) by mouth daily.   furosemide 40 MG  tablet Commonly known as: LASIX Take 1 tablet (40 mg total) by mouth daily. What changed:  how much to take when to take this   guaiFENesin 100 MG/5ML liquid Commonly known as: ROBITUSSIN Take 5 mLs by mouth every 4 (four) hours as needed for cough or to loosen phlegm.   multivitamin with minerals Tabs tablet Take 1 tablet by mouth daily. Start taking on: May 18, 2022   pantoprazole 40 MG tablet Commonly known as: PROTONIX Take 1 tablet (40 mg total) by mouth in the morning.   polyethylene glycol 17 g packet Commonly known as: MIRALAX / GLYCOLAX Take 17 g by mouth daily as needed for mild constipation.   predniSONE 20 MG tablet Commonly known as: DELTASONE Take 1 tablet (20 mg total) by mouth daily with breakfast for 5 days. Start taking on: May 18, 2022   rosuvastatin 5 MG tablet Commonly known as: CRESTOR TAKE 1 TABLET EVERY DAY What changed: when to take this   tamsulosin 0.4 MG Caps capsule Commonly known as: FLOMAX TAKE 1 CAPSULE EVERY DAY What changed: when to take this   VITAMIN D-3 PO Take 1 capsule by mouth in the morning.   Xiidra 5 % Soln Generic drug: Lifitegrast Place 1 drop into both eyes 2 (two) times daily. What changed:  when to take this reasons to take this   Zinc Oxide 12.8 % ointment Commonly known as: TRIPLE PASTE Apply topically 4 (four) times daily as needed (skin barrier). What changed:  how much to take when to take this               Discharge Care Instructions  (From admission, onward)           Start     Ordered   05/17/22 0000  Discharge wound care:       Comments: Use moisture barrier cream twice daily and as needed after each episode of incontinence.  May place a piece of Xeroform gauze Kellie Simmering [phone]) to areas of partial thickness skin loss daily.  Cover with sacral foam, change foam q3 days and prn soiling.   05/17/22 1159            Follow-up Information     [facility] [place] Follow up.    Specialty: [place] Why: Resume HH services- HHRN/PT/OT/SW- they will contact you to schedule Contact information: [address] [place]  [address] [phone]                Discharge Exam: Filed Weights   05/13/22 0432 05/14/22 0406 05/15/22 0411  Weight: 65.3 kg 67.3 kg 67 kg   BP (!) 110/55 (BP Location: Left Arm)   Pulse 78  Temp 97.6 F (36.4 C) (Oral)   Resp 20   Ht 5\' 3"  (1.6 m)   Wt 67 kg   SpO2 97%   BMI 26.17 kg/m   Patient is feeling better, his dyspnea has improved, has occasional cough. He is feeling happy about going home.   Neurology awake and alert. Decreased hearing, deconditioned   ENT with mild pallor Cardiovascular with S1 and S2 present and rhythmic with no gallops, rubs or murmurs Respiratory with no wheezing, positive bilateral rhonchi, with no rales Abdomen with no distention  No lower extremity edema   Condition at discharge: stable  The results of significant diagnostics from this hospitalization (including imaging, microbiology, ancillary and laboratory) are listed below for reference.   Imaging Studies: DG Chest Port 1 View  Result Date: 05/11/2022 CLINICAL DATA:  Shortness of breath edema EXAM: PORTABLE CHEST 1 VIEW COMPARISON:  05/01/2022 FINDINGS: Post sternotomy changes. Small right-sided pleural effusion. Cardiomegaly with vascular congestion versus bronchitic change. Aortic atherosclerosis. IMPRESSION: Small right pleural effusion. Cardiomegaly with vascular congestion versus bronchitic change. Electronically Signed   By: Donavan Foil M.D.   On: 05/11/2022 22:44   DG CHEST PORT 1 VIEW  Result Date: 05/01/2022 CLINICAL DATA:  Shortness of breath and chest pain, initial encounter EXAM: PORTABLE CHEST 1 VIEW COMPARISON:  04/30/2022 FINDINGS: Cardiac shadow is stable. Postsurgical changes are again seen. The lungs are well aerated bilaterally. No focal infiltrate is seen. No bony abnormality is noted.  IMPRESSION: No active disease. Electronically Signed   By: Inez Catalina M.D.   On: 05/01/2022 17:14   VAS [place] LOWER EXTREMITY VENOUS (DVT)  Result Date: 05/01/2022  Lower Venous DVT Study Patient Name:  RODEL GLASPY  Date of Exam:   04/29/2022 Medical Rec #: [id number]     Accession #:    [id number] Date of Birth: September 20, 1927     Patient Gender: M Patient Age:   61 years Exam Location[facility]  [facility] Procedure:      VAS [place] LOWER EXTREMITY VENOUS (DVT) Referring Phys: A POWELL JR --------------------------------------------------------------------------------  Indications: History of PE.  Comparison Study: 01/27/20 Negative LEV Performing Technologist: Velva Harman Sturdivant RDMS, RVT  Examination Guidelines: A complete evaluation includes B-mode imaging, spectral Doppler, color Doppler, and power Doppler as needed of all accessible portions of each vessel. Bilateral testing is considered an integral part of a complete examination. Limited examinations for reoccurring indications may be performed as noted. The reflux portion of the exam is performed with the patient in reverse Trendelenburg.  +---------+---------------+---------+-----------+----------+--------------+ RIGHT    CompressibilityPhasicitySpontaneityPropertiesThrombus Aging +---------+---------------+---------+-----------+----------+--------------+ CFV      Full           Yes      Yes                                 +---------+---------------+---------+-----------+----------+--------------+ SFJ      Full                                                        +---------+---------------+---------+-----------+----------+--------------+ FV Prox  Full                                                        +---------+---------------+---------+-----------+----------+--------------+  FV Mid   Full                                                        +---------+---------------+---------+-----------+----------+--------------+ FV  DistalFull                                                        +---------+---------------+---------+-----------+----------+--------------+ PFV      Full                                                        +---------+---------------+---------+-----------+----------+--------------+ POP      Full           Yes      Yes                                 +---------+---------------+---------+-----------+----------+--------------+ PTV      Full                                                        +---------+---------------+---------+-----------+----------+--------------+ PERO     Full                                                        +---------+---------------+---------+-----------+----------+--------------+   +---------+---------------+---------+-----------+----------+--------------+ LEFT     CompressibilityPhasicitySpontaneityPropertiesThrombus Aging +---------+---------------+---------+-----------+----------+--------------+ CFV      Full           Yes      Yes                                 +---------+---------------+---------+-----------+----------+--------------+ SFJ      Full                                                        +---------+---------------+---------+-----------+----------+--------------+ FV Prox  Full                                                        +---------+---------------+---------+-----------+----------+--------------+ FV Mid   Full                                                        +---------+---------------+---------+-----------+----------+--------------+  FV DistalFull                                                        +---------+---------------+---------+-----------+----------+--------------+ PFV      Full                                                        +---------+---------------+---------+-----------+----------+--------------+ POP      Full           Yes      Yes                                  +---------+---------------+---------+-----------+----------+--------------+ PTV      Full                                                        +---------+---------------+---------+-----------+----------+--------------+ PERO     Full                                                        +---------+---------------+---------+-----------+----------+--------------+     Summary: BILATERAL: - No evidence of deep vein thrombosis seen in the lower extremities, bilaterally. -No evidence of popliteal cyst, bilaterally.   *See table(s) above for measurements and observations. Electronically signed by Servando Snare MD on 05/01/2022 at 10:15:47 AM.    Final    DG CHEST PORT 1 VIEW  Result Date: 04/30/2022 CLINICAL DATA:  Shortness of breath EXAM: PORTABLE CHEST 1 VIEW COMPARISON:  Chest x-ray April 27, 2022 FINDINGS: Stable cardiomegaly. Sternotomy wires are intact. The hila and mediastinum is stable. No pneumothorax. Increased interstitial markings bilaterally have worsened mildly. No other interval changes or acute abnormalities. IMPRESSION: Findings are most consistent with cardiomegaly and mild pulmonary edema. Electronically Signed   By: Dorise Bullion III M.D.   On: 04/30/2022 12:57   ECHOCARDIOGRAM LIMITED  Result Date: 04/28/2022    ECHOCARDIOGRAM LIMITED REPORT   Patient Name:   COLLIER BOHNET Date of Exam: 04/28/2022 Medical Rec #:  [id number]    Height:       63.0 in Accession #:    [id number]   Weight:       170.0 lb Date of Birth:  Aug 08, 1927    BSA:          1.805 m Patient Age:    76 years     BP:           124/59 mmHg Patient Gender: M            HR:           60 bpm. Exam Location:  Inpatient Procedure: Limited Echo, Cardiac Doppler and Limited Color Doppler Indications:    CHF-Acute Systolic D14.97  History:        Patient  has prior history of Echocardiogram examinations, most                 recent 04/13/2021. CAD, Aortic Valve Disease, Arrythmias:Atrial                  Fibrillation, Signs/Symptoms:Shortness of Breath; Risk                 Factors:Non-Smoker, Hypertension and Dyslipidemia.  Sonographer:    Greer Pickerel Referring Phys: [id number] RONDELL A SMITH  Sonographer Comments: Image acquisition challenging due to patient body habitus and Image acquisition challenging due to respiratory motion. IMPRESSIONS  1. Assessing wall motion is challenging. Left ventricular ejection fraction, by estimation, is 50 to 55%. The left ventricle has low normal function.  2. Right ventricular systolic function is mildly reduced. The right ventricular size is moderately enlarged. There is moderately elevated pulmonary artery systolic pressure.  3. Left atrial size was moderately dilated.  4. Trivial mitral valve regurgitation.  5. Aortic valve regurgitation is not visualized.  6. The inferior vena cava is normal in size with <50% respiratory variability, suggesting right atrial pressure of 8 mmHg. FINDINGS  Left Ventricle: Assessing wall motion is challenging. Left ventricular ejection fraction, by estimation, is 50 to 55%. The left ventricle has low normal function. There is no left ventricular hypertrophy. Right Ventricle: The right ventricular size is moderately enlarged. Right ventricular systolic function is mildly reduced. There is moderately elevated pulmonary artery systolic pressure. The tricuspid regurgitant velocity is 2.93 m/s, and with an assumed right atrial pressure of 15 mmHg, the estimated right ventricular systolic pressure is 41.9 mmHg. Left Atrium: Left atrial size was moderately dilated. Right Atrium: Right atrial size was normal in size. Mitral Valve: Trivial mitral valve regurgitation. Tricuspid Valve: Tricuspid valve regurgitation is mild. Aortic Valve: Aortic valve regurgitation is not visualized. Pulmonic Valve: Pulmonic valve regurgitation is not visualized. Venous: The inferior vena cava is normal in size with less than 50% respiratory variability, suggesting right  atrial pressure of 8 mmHg. Additional Comments: Spectral Doppler performed. Color Doppler performed.  LEFT VENTRICLE PLAX 2D LVIDd:         3.80 cm LVIDs:         3.00 cm LV PW:         1.00 cm LV IVS:        0.90 cm  MITRAL VALVE               TRICUSPID VALVE MV Area (PHT): 6.32 cm    TR Peak grad:   34.3 mmHg MV Decel Time: 120 msec    TR Vmax:        293.00 cm/s MV E velocity: 92.20 cm/s MV A velocity: 37.80 cm/s MV E/A ratio:  2.44 Landscape architect signed by Phineas Inches Signature Date/Time: 04/28/2022/5:35:41 PM    Final    CT Angio Chest PE W and/or Wo Contrast  Result Date: 04/28/2022 CLINICAL DATA:  Chest pain, dyspnea EXAM: CT ANGIOGRAPHY CHEST WITH CONTRAST TECHNIQUE: Multidetector CT imaging of the chest was performed using the standard protocol during bolus administration of intravenous contrast. Multiplanar CT image reconstructions and MIPs were obtained to evaluate the vascular anatomy. RADIATION DOSE REDUCTION: This exam was performed according to the departmental dose-optimization program which includes automated exposure control, adjustment of the mA and/or kV according to patient size and/or use of iterative reconstruction technique. CONTRAST:  48mL OMNIPAQUE IOHEXOL 350 MG/ML SOLN COMPARISON:  None Available. FINDINGS: Cardiovascular: There is adequate opacification of  pulmonary arterial tree. There is a single intraluminal filling defect identified within the posterior segmental pulmonary artery of the right lower lobe in keeping with an acute pulmonary embolus. The overall embolic burden is tiny. The central pulmonary arteries are dilated in keeping with changes of pulmonary arterial hypertension. Coronary artery bypass grafting has been performed. Global cardiac size is mildly enlarged, stable since prior examination. There is mild relative dilation of the right ventricle and right atrium in keeping with elevated right heart pressures as well as prominent reflux of contrast to the  hepatic venous system in keeping with some degree of right heart failure. The RV/LV ratio is elevated (RV/LV ratio equal 1.0), however, this appears stable since prior examination and is likely chronic in nature. No pericardial effusion. Thoracic aorta is of normal caliber. Moderate atherosclerotic calcification. Mediastinum/Nodes: Shotty mediastinal adenopathy is nonspecific, possibly reactive in nature. No frankly pathologic thoracic adenopathy. Visualized thyroid unremarkable. Esophagus is unremarkable. Lungs/Pleura: Small bilateral pleural effusions are present. There is superimposed thickening of the peribronchovascular interstitium and scattered basilar and dependent predominant interstitial and ground-glass pulmonary infiltrates most suggestive of superimposed mild cardiogenic failure. More focal consolidation within the posterior basal right lower lobe may reflect superimposed pulmonary infarct given the previously identified pulmonary embolus. Upper Abdomen: No acute abnormality Musculoskeletal: No lytic or blastic bone lesion. Osseous structures are age-appropriate. Review of the MIP images confirms the above findings. IMPRESSION: 1. Small acute pulmonary embolus within the posterior segmental pulmonary artery of the right lower lobe. The overall embolic burden is tiny. While there is evidence right heart strain, this appears chronic in nature and is stable since prior examination. 2. Morphologic changes in keeping with pulmonary arterial hypertension and elevated right heart pressures. Reflux of contrast into the hepatic venous system in keeping with at least some degree of right heart failure. 3. Mild interstitial and alveolar pulmonary edema and small bilateral pleural effusions in keeping with mild cardiogenic failure. 4. Superimposed focal consolidation within the posterior basal right lower lobe may reflect superimposed pulmonary infarct given the previously identified pulmonary embolus. These  results were called by telephone at the time of interpretation on 04/28/2022 at 3:43 am to provider Ripley Fraise , who verbally acknowledged these results. Aortic Atherosclerosis (ICD10-I70.0). Electronically Signed   By: Fidela Salisbury M.D.   On: 04/28/2022 03:43   CT Head Wo Contrast  Result Date: 04/28/2022 CLINICAL DATA:  Evaluation for delirium. EXAM: CT HEAD WITHOUT CONTRAST TECHNIQUE: Contiguous axial images were obtained from the base of the skull through the vertex without intravenous contrast. RADIATION DOSE REDUCTION: This exam was performed according to the departmental dose-optimization program which includes automated exposure control, adjustment of the mA and/or kV according to patient size and/or use of iterative reconstruction technique. COMPARISON:  Prior CT from 03/12/2022. FINDINGS: Brain: Diffuse prominence of the CSF containing spaces compatible generalized cerebral atrophy. Moderately advanced chronic microvascular ischemic disease noted involving the supratentorial cerebral white matter. Few small remote lacunar infarcts about the bilateral basal ganglia/corona radiata. Small remote right cerebellar infarct. No acute intracranial hemorrhage. No acute large vessel territory infarct. No mass lesion, mass effect, or midline shift. No hydrocephalus or extra-axial fluid collection. Vascular: No abnormal hyperdense vessel. Calcified atherosclerosis present at skull base. Skull: Scalp soft tissues demonstrate no acute finding. Calvarium intact. Sinuses/Orbits: Globes and orbital soft tissues within normal limits. Chronic left maxillary sinusitis noted. Prior right mastoidectomy. Small chronic appearing left mastoid effusion noted. Other: None. IMPRESSION: 1. No acute intracranial abnormality. 2. Generalized cerebral  atrophy with moderately advanced chronic small vessel ischemic disease. 3. Chronic left maxillary sinusitis. Electronically Signed   By: Jeannine Boga M.D.   On: 04/28/2022  03:30   DG Chest 2 View  Result Date: 04/27/2022 CLINICAL DATA:  Chest pain.  Shortness of breath. EXAM: CHEST - 2 VIEW COMPARISON:  CXR 03/12/22 FINDINGS: No pleural effusion. No pneumothorax. No focal airspace opacity. Unchanged cardiac and mediastinal contours. Status post median sternotomy. No displaced rib fractures. Visualized upper abdomen is notable for gas distended loops of bowel and stomach. IMPRESSION: No focal airspace opacity. Electronically Signed   By: Marin Roberts M.D.   On: 04/27/2022 17:01    Microbiology: Results for orders placed or performed during the hospital encounter of 05/11/22  Resp panel by RT-PCR (RSV, Flu A&B, Covid) Anterior Nasal Swab     Status: Abnormal   Collection Time: 05/12/22  2:06 AM   Specimen: Anterior Nasal Swab  Result Value Ref Range Status   SARS Coronavirus 2 by RT PCR NEGATIVE NEGATIVE Final   Influenza A by PCR NEGATIVE NEGATIVE Final   Influenza B by PCR NEGATIVE NEGATIVE Final    Comment: (NOTE) The Xpert Xpress SARS-CoV-2/FLU/RSV plus assay is intended as an aid in the diagnosis of influenza from Nasopharyngeal swab specimens and should not be used as a sole basis for treatment. Nasal washings and aspirates are unacceptable for Xpert Xpress SARS-CoV-2/FLU/RSV testing.  Fact Sheet for Patients: EntrepreneurPulse.com.au  Fact Sheet for Healthcare Providers: IncredibleEmployment.be  This test is not yet approved or cleared by the [place] FDA and has been authorized for detection and/or diagnosis of SARS-CoV-2 by FDA under an Emergency Use Authorization (EUA). This EUA will remain in effect (meaning this test can be used) for the duration of the COVID-19 declaration under Section 564(b)(1) of the Act, 21 U.S.C. section 360bbb-3(b)(1), unless the authorization is terminated or revoked.     Resp Syncytial Virus by PCR POSITIVE (A) NEGATIVE Final    Comment: (NOTE) Fact Sheet for  Patients: EntrepreneurPulse.com.au  Fact Sheet for Healthcare Providers: IncredibleEmployment.be  This test is not yet approved or cleared by the [place] FDA and has been authorized for detection and/or diagnosis of SARS-CoV-2 by FDA under an Emergency Use Authorization (EUA). This EUA will remain in effect (meaning this test can be used) for the duration of the COVID-19 declaration under Section 564(b)(1) of the Act, 21 U.S.C. section 360bbb-3(b)(1), unless the authorization is terminated or revoked.  Performed at [facility], [place] [address] [place], [place] [address]   Respiratory (~20 pathogens) panel by PCR     Status: Abnormal   Collection Time: 05/12/22  2:06 AM   Specimen: Nasopharyngeal Swab; Respiratory  Result Value Ref Range Status   Adenovirus NOT DETECTED NOT DETECTED Final   Coronavirus 229E NOT DETECTED NOT DETECTED Final    Comment: (NOTE) The Coronavirus on the Respiratory Panel, DOES NOT test for the novel  Coronavirus (2019 nCoV)    Coronavirus HKU1 NOT DETECTED NOT DETECTED Final   Coronavirus NL63 NOT DETECTED NOT DETECTED Final   Coronavirus OC43 NOT DETECTED NOT DETECTED Final   Metapneumovirus NOT DETECTED NOT DETECTED Final   Rhinovirus / Enterovirus NOT DETECTED NOT DETECTED Final   Influenza A NOT DETECTED NOT DETECTED Final   Influenza B NOT DETECTED NOT DETECTED Final   Parainfluenza Virus 1 NOT DETECTED NOT DETECTED Final   Parainfluenza Virus 2 NOT DETECTED NOT DETECTED Final   Parainfluenza Virus 3 NOT DETECTED NOT  DETECTED Final   Parainfluenza Virus 4 NOT DETECTED NOT DETECTED Final   Respiratory Syncytial Virus DETECTED (A) NOT DETECTED Final   Bordetella pertussis NOT DETECTED NOT DETECTED Final   Bordetella Parapertussis NOT DETECTED NOT DETECTED Final   Chlamydophila pneumoniae NOT DETECTED NOT DETECTED Final   Mycoplasma pneumoniae NOT DETECTED NOT DETECTED Final    Comment: Performed at  [facility], [place] [address], [place], [place] [address]   *Note: Due to a large number of results and/or encounters for the requested time period, some results have not been displayed. A complete set of results can be found in Results Review.    Labs: CBC: Recent Labs  Lab 05/10/22 1616 05/11/22 2301 05/12/22 0529 05/14/22 0103  WBC 4.7 4.0 4.5 6.5  NEUTROABS 3,064 2.1  --  4.9  HGB 9.7* 9.0* 9.0* 8.9*  HCT 29.5* 28.6* 27.4* 26.8*  MCV 90.2 94.7 91.9 90.2  PLT 130* 106* 104* 078*   Basic Metabolic Panel: Recent Labs  Lab 05/12/22 0523 05/12/22 0529 05/13/22 0114 05/14/22 0103 05/15/22 0117 05/16/22 0146 05/17/22 0106  NA  --    < > 130* 131* 133* 132* 133*  K  --    < > 3.8 3.7 3.5 3.9 3.5  CL  --    < > 99 99 102 101 103  CO2  --    < > 21* 20* 20* 21* 19*  GLUCOSE  --    < > 98 110* 106* 204* 178*  BUN  --    < > 26* 36* 35* 34* 47*  CREATININE  --    < > 1.43* 1.70* 1.41* 1.47* 1.55*  CALCIUM  --    < > 8.3* 8.5* 8.5* 8.5* 8.9  MG 1.9  --  1.8 1.8  --   --   --   PHOS  --   --   --  3.5  --   --   --    < > = values in this interval not displayed.   Liver Function Tests: Recent Labs  Lab 05/10/22 1616 05/11/22 2301  AST 17 27  ALT 8* 11  ALKPHOS  --  78  BILITOT 0.7 0.4  PROT 7.2 6.8  ALBUMIN  --  3.3*   CBG: No results for input(s): "GLUCAP" in the last 168 hours.  Discharge time spent: greater than 30 minutes.  Signed: Tawni Millers, MD [facility] 05/17/2022

## 2022-05-17 NOTE — Progress Notes (Signed)
Physical Therapy Treatment Patient Details Name: Evan [name] MRN: [id number] DOB: 1927-08-04 Today's Date: 05/17/2022   History of Present Illness 89 yo male presents to [facility] on 2/7 with ShOB and decreased urine output, +RSV and HF exacerbation. Recent hospitalization 1/24-1/30 with newly diagnosed PE and CHF exacerbation. 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    PT Comments    Patient received in bed, he is agreeable to getting oob. Family present in room. Patient requires mod A for bed mobility. Mod +1-2 for transfers and step pivot to recliner. He is hopeful to go home today, MD just leaving room as I entered. Patient will continue to benefit from skilled PT to improve/maintain strength and functional independence to reduce caregiver burden.         Recommendations for follow up therapy are one component of a multi-disciplinary discharge planning process, led by the attending physician.  Recommendations may be updated based on patient status, additional functional criteria and insurance authorization.  Follow Up Recommendations  Home health PT     Assistance Recommended at Discharge Frequent or constant Supervision/Assistance  Patient can return home with the following A little help with bathing/dressing/bathroom;Assistance with cooking/housework;Assist for transportation;Help with stairs or ramp for entrance;Direct supervision/assist for medications management;Direct supervision/assist for financial management;A lot of help with walking and/or transfers   Equipment Recommendations  None recommended by PT (family report they have all necessary equipment)    Recommendations for Other Services       Precautions / Restrictions  Precautions Precautions: Fall Precaution Comments: residual R hemiparesis Restrictions Weight Bearing Restrictions: No     Mobility  Bed Mobility Overal bed mobility: Needs Assistance Bed Mobility: Supine to Sit     Supine to sit: Mod assist     General bed mobility comments: Assist for trunk elevation and to get scooted around and to edge of bed using bed pad.    Transfers Overall transfer level: Needs assistance Equipment used: 2 person hand held assist Transfers: Sit to/from Stand, Bed to chair/wheelchair/BSC Sit to Stand: Min assist, +2 physical assistance, Mod assist   Step pivot transfers: Min assist, Mod assist, +2 physical assistance       General transfer comment: min-mod A for sit to stand and to step pivot to recliner. Family member assisted, but he probably could have performed with +1 assist.    Ambulation/Gait                   Stairs             Wheelchair Mobility    Modified Rankin (Stroke Patients Only)       Balance Overall balance assessment: Needs assistance Sitting-balance support: Feet supported Sitting balance-[name] Scale: Fair Sitting balance - Comments: fair to poor, posterior bias, fatigues quickly in sitting requesting to get into chair to have support Postural control: Posterior lean Standing balance support: Bilateral upper extremity supported, During functional activity, Reliant on assistive device for balance Standing balance-[name] Scale: Poor Standing balance comment: PT and family assist                            Cognition Arousal/Alertness: Awake/alert Behavior During Therapy: WFL for tasks assessed/performed Overall Cognitive Status: Within  Functional Limits for tasks assessed                         Following Commands: Follows one step commands consistently                Exercises      General Comments        Pertinent Vitals/Pain Pain Assessment Pain Assessment:  No/denies pain    Home Living                          Prior Function            PT Goals (current goals can now be found in the care plan section) Acute Rehab PT Goals Patient Stated Goal: to return home PT Goal Formulation: With patient/family Time For Goal Achievement: 05/26/22 Potential to Achieve Goals: Good Progress towards PT goals: Progressing toward goals    Frequency    Min 3X/week      PT Plan Current plan remains appropriate    Co-evaluation              AM-PAC PT "6 Clicks" Mobility   Outcome Measure  Help needed turning from your back to your side while in a flat bed without using bedrails?: A Lot Help needed moving from lying on your back to sitting on the side of a flat bed without using bedrails?: A Lot Help needed moving to and from a bed to a chair (including a wheelchair)?: A Lot Help needed standing up from a chair using your arms (e.g., wheelchair or bedside chair)?: A Lot Help needed to walk in hospital room?: A Lot Help needed climbing 3-5 steps with a railing? : Total 6 Click Score: 11    End of Session   Activity Tolerance: Patient tolerated treatment well;Patient limited by fatigue Patient left: in chair;with call bell/phone within reach;with family/visitor present Nurse Communication: Mobility status PT Visit Diagnosis: Unsteadiness on feet (R26.81);Muscle weakness (generalized) (M62.81);Difficulty in walking, not elsewhere classified (R26.2);Hemiplegia and hemiparesis Hemiplegia - Right/Left: Right Hemiplegia - dominant/non-dominant: Dominant     Time: 7169-6789 PT Time Calculation (min) (ACUTE ONLY): 26 min  Charges:  $Therapeutic Activity: 8-22 mins                     Jafar Poffenberger, PT, GCS 05/17/22,11:45 AM

## 2022-05-17 NOTE — TOC Transition Note (Addendum)
Transition of Care (TOC) - CM/SW Discharge Note Marvetta Gibbons RN, BSN Transitions of Care Unit 4E- RN Case Manager See Treatment Team for direct phone #   Patient Details  Name: Evan [name] MRN: [id number] Date of Birth: July 12, 1927  Transition of Care [facility]) CM/SW Contact:  Dawayne Patricia, RN Phone Number: 05/17/2022, 1:25 PM   Clinical Narrative:    Per MD pt medically stable to transition home today, Family trying to decide on Home w/ HH and PC vs Home w/ Hospice.   CM met with pt's family daughter and two sons at the bedside- pt up in chair.  Discussed current [place] services in the home and adding outpt PC which at this time would be delayed until first of March as local agencies are on a hold for [employer] referrals. Also discussed Home Hospice services and answered questions. Family still not sure which way they want to go- offered to have one of the Hospice liaisons come speak with them to help answer further Hospice questions. List provided for Choice Per CMS guidelines from ProtectionPoker.[email or web address] website with star ratings (copy placed in shadow chart)- family agreeable to have liaison from [facility] come speak with them at bedside.   Call made to Freddie Breech with [facility] who will come speak with family at bedside within 1min.   1300- Msg received from [place] that she has spoken with family for about 40 min to answer questions about Hospice services- family requested time to speak amongst themselves and will contact either [facility] [place] or TOC with final dispo plan.   1320- Call received back from [place]- family has decided to to move forward with return home with Hospice and will be using [facility]. Per Anderson [facility] pt does not have any urgent DME needs- pt has DME already in home, Family voiced that they will provide transport home.  Between liaison updated on plan for Home Hospice     Final next level of care: Home w Hospice Care Barriers to Discharge:  Barriers Resolved   Patient Goals and CMS Choice CMS Medicare.gov Compare Post Acute Care list provided to:: Patient Choice offered to / list presented to : Patient, Adult Children  Discharge Placement                 Home w/ Hospice.         Discharge Plan and Services Additional resources added to the After Visit Summary for   In-house Referral: NA Discharge Planning Services: CM Consult Post Acute Care Choice: Home Health, Resumption of Svcs/PTA Provider          DME Arranged: N/A DME Agency: NA       HH Arranged: Disease Management [place] Agency: Other - See comment ([facility]) Date [place]: 05/17/22 Time [place]: 110 Representative spoke with at [place]: [place] Determinants of Health (SDOH) Interventions SDOH Screenings   Food Insecurity: No Food Insecurity (04/28/2022)  Housing: Low Risk  (05/04/2022)  Transportation Needs: No Transportation Needs (05/04/2022)  Utilities: Not At Risk (04/28/2022)  Alcohol Screen: Low Risk  (05/07/2021)  Depression (PHQ2-9): Low Risk  (03/25/2022)  Recent Concern: Depression (PHQ2-9) - High Risk (01/05/2022)  Financial Resource Strain: Low Risk  (05/04/2022)  Physical Activity: Insufficiently Active (05/07/2021)  Social Connections: Socially Isolated (05/07/2021)  Stress: No Stress Concern Present (05/07/2021)  Tobacco Use: Medium Risk (05/10/2022)     Readmission Risk Interventions    05/17/2022    1:25 PM 04/29/2022  3:21 PM  Readmission Risk Prevention Plan  Transportation Screening Complete Complete  HRI or Home Care Consult  Complete  Social Work Consult for [place] Planning/Counseling  Complete  Palliative Care Screening  Not Applicable  Medication Review Press photographer) Complete Referral to Pharmacy  PCP or Specialist appointment within 3-5 days of discharge Complete   HRI or Home Care Consult Complete

## 2022-05-17 NOTE — Progress Notes (Signed)
CM Kristi in room and pt about to fall. Pt sitting on foot rest of recliner with family in the room. Family states he can not tolerate sitting in that chair. RN Susmita available to assist patient back to bed. Family discussing plan of care for discharge. Will return to give patient bath and get ready for home. Pt resting with call bell within reach.  Will continue to monitor.

## 2022-05-24 ENCOUNTER — Other Ambulatory Visit (HOSPITAL_COMMUNITY): Payer: Self-pay

## 2022-05-25 ENCOUNTER — Encounter: Payer: Self-pay | Admitting: Family Medicine

## 2022-05-25 ENCOUNTER — Telehealth: Payer: Self-pay

## 2022-05-25 ENCOUNTER — Other Ambulatory Visit (HOSPITAL_COMMUNITY): Payer: Self-pay

## 2022-05-25 ENCOUNTER — Ambulatory Visit (INDEPENDENT_AMBULATORY_CARE_PROVIDER_SITE_OTHER): Payer: Medicare HMO | Admitting: Family Medicine

## 2022-05-25 VITALS — BP 120/80 | HR 84 | Temp 97.6°F | Ht 63.0 in | Wt 160.0 lb

## 2022-05-25 DIAGNOSIS — H6123 Impacted cerumen, bilateral: Secondary | ICD-10-CM

## 2022-05-25 DIAGNOSIS — H9193 Unspecified hearing loss, bilateral: Secondary | ICD-10-CM | POA: Diagnosis not present

## 2022-05-25 NOTE — Assessment & Plan Note (Signed)
Impacted cerumen bilaterally irrigated and successfully removed. TM WNL bilaterally. Hearing returned to baseline with hearing aids in place.

## 2022-05-25 NOTE — Progress Notes (Signed)
Acute Office Visit  Subjective:     Patient ID: Evan [name], male    DOB: Jan 20, 1928, 89 y.o.   MRN: [id number]  No chief complaint on file.   HPI Patient is in today accompanied by daughter and son for worsening hearing and concerns for wax buildup. He is HOH at baseline and uses hearing aids. He has a history of this problem and is usually sent to PCP for wax removal.   Review of Systems  All other systems reviewed and are negative.   Past Medical History:  Diagnosis Date   Adenocarcinoma of right lung ([place]) 01/06/2016   Anginal chest pain at rest    Chronic non, controlled on Lorazepam   Anxiety    Arthritis    "all over"    BPH (benign prostatic hyperplasia)    CAD (coronary artery disease)    Chronic lower back pain    Complication of anesthesia    "problems making water afterwards"   Depression    DJD (degenerative joint disease)    Esophageal ulcer without bleeding    bid ppi indefinitely   Family history of adverse reaction to anesthesia    "children w/PONV"   GERD (gastroesophageal reflux disease)    Headache    "couple/week maybe" (09/04/2015)   Hemochromatosis    Possible, elevated iron stores, hook-like osteophytes on hand films, normal LFTs   Hepatitis    "yellow jaundice as a baby"   History of ASCVD    MULTIVESSEL   Hyperlipidemia    Hypertension    Mild aortic stenosis 06/23/2021   Osteoarthritis    Pneumonia    "several times; got a little now" (09/04/2015)   Rheumatoid arthritis ([place])    Subdural hematoma ([place])    small after fall 09/2016-plavix held, neurosurgery consulted.  Asymptomatic.     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 [place] W/ STENT     DE stent ostium into the right radial free graft at OM-, 02-2007   ESOPHAGOGASTRODUODENOSCOPY N/A 11/09/2014   Procedure: ESOPHAGOGASTRODUODENOSCOPY (EGD);  Surgeon: [name], MD;  Location: [facility] ENDOSCOPY;  Service: Endoscopy;  Laterality: N/A;   INGUINAL HERNIA REPAIR     JOINT REPLACEMENT     LEFT HEART CATH AND CORS/GRAFTS ANGIOGRAPHY N/A 06/27/2017   Procedure: LEFT HEART CATH AND CORS/GRAFTS ANGIOGRAPHY;  Surgeon: [name], MD;  Location: [facility] CV LAB;  Service: Cardiovascular;  Laterality: N/A;   NASAL SINUS SURGERY     SHOULDER OPEN ROTATOR CUFF REPAIR Right    TOTAL KNEE ARTHROPLASTY Bilateral    Current Outpatient Medications on File Prior to Visit  Medication Sig Dispense Refill   acetaminophen (TYLENOL) 500 MG tablet Take 500 mg by mouth 2 (two) times daily as needed for mild pain, moderate pain, fever or headache.     albuterol (PROVENTIL) (2.5 MG/3ML) 0.083% nebulizer solution Take 3 mLs (2.5 mg total) by nebulization every 4 (four) hours as needed for wheezing or shortness of breath. 75 mL 12   apixaban (ELIQUIS) 2.5  MG TABS tablet Take 1 tablet (2.5 mg total) by mouth 2 (two) times daily. 60 tablet 1   budesonide-formoterol (SYMBICORT) 160-4.5 MCG/ACT inhaler Inhale 2 puffs into the lungs in the morning and at bedtime. 10.2 g 12   Cholecalciferol (VITAMIN D-3 PO) Take 1 capsule by mouth in the morning.     cyanocobalamin 1000 MCG tablet Take 1 tablet (1,000 mcg total) by mouth daily. 30 tablet 1   ferrous sulfate 325 (65 FE) MG tablet Take 1 tablet (325 mg total) by mouth every other day. 15 tablet 1   finasteride (PROSCAR) 5 MG tablet Take 1 tablet (5 mg total) by mouth daily. 90 tablet 3   FLUoxetine (PROZAC) 20 MG capsule TAKE 1 CAPSULE EVERY DAY (Patient taking differently: Take 20 mg by mouth in the morning.) 90 capsule 0   folic acid  (FOLVITE) 1 MG tablet Take 1 tablet (1 mg total) by mouth daily. 30 tablet 1   furosemide (LASIX) 40 MG tablet Take 1 tablet (40 mg total) by mouth daily. 30 tablet 0   guaiFENesin (ROBITUSSIN) 100 MG/5ML liquid Take 5 mLs by mouth every 4 (four) hours as needed for cough or to loosen phlegm. 118 mL 0   Lifitegrast (XIIDRA) 5 % SOLN Place 1 drop into both eyes 2 (two) times daily. (Patient taking differently: Place 1 drop into both eyes as needed (dry eyes).) 60 each 12   Multiple Vitamin (MULTIVITAMIN WITH MINERALS) TABS tablet Take 1 tablet by mouth daily. 30 tablet 0   pantoprazole (PROTONIX) 40 MG tablet Take 1 tablet (40 mg total) by mouth in the morning. 90 tablet 0   polyethylene glycol (MIRALAX / GLYCOLAX) 17 g packet Take 17 g by mouth daily as needed for mild constipation. 14 each 0   rosuvastatin (CRESTOR) 5 MG tablet TAKE 1 TABLET EVERY DAY (Patient taking differently: Take 5 mg by mouth at bedtime.) 90 tablet 1   tamsulosin (FLOMAX) 0.4 MG CAPS capsule TAKE 1 CAPSULE EVERY DAY (Patient taking differently: Take 0.4 mg by mouth daily with supper.) 90 capsule 10   Zinc Oxide (TRIPLE PASTE) 12.8 % ointment Apply topically 4 (four) times daily as needed (skin barrier). (Patient taking differently: Apply 1 Application topically 2 (two) times daily as needed (skin barrier).) 56.7 g 0   No current facility-administered medications on file prior to visit.   Allergies  Allergen Reactions   Feldene [Piroxicam] Rash    Blistering rash   Neomycin-Polymyxin-Hc Itching   Statins Other (See Comments)    Myalgias   Valium [Diazepam] Other (See Comments)    Dizziness    Latex Rash   Tequin [Gatifloxacin] Anxiety   Vibra-Tab [Doxycycline] Nausea Only       Objective:    BP 120/80   Pulse 84   Temp 97.6 F (36.4 C) (Oral)   Ht 5\' 3"  (1.6 m)   Wt 160 lb (72.6 kg)   SpO2 99%   BMI 28.34 kg/m    Physical Exam Vitals and nursing note reviewed.  Constitutional:      Appearance: Normal  appearance. He is normal weight.  HENT:     Head: Normocephalic and atraumatic.     Right Ear: Tympanic membrane, ear canal and external ear normal. There is impacted cerumen.     Left Ear: Tympanic membrane, ear canal and external ear normal. There is impacted cerumen.  Skin:    General: Skin is warm and dry.     Capillary Refill: Capillary refill takes  less than 2 seconds.  Neurological:     General: No focal deficit present.     Mental Status: He is alert and oriented to person, place, and time. Mental status is at baseline.  Psychiatric:        Mood and Affect: Mood normal.        Behavior: Behavior normal.        Thought Content: Thought content normal.        Judgment: Judgment normal.     No results found for any visits on 05/25/22.      Assessment & Plan:   Problem List Items Addressed This Visit       Nervous and Auditory   Bilateral hearing loss - Primary    Impacted cerumen bilaterally irrigated and successfully removed. TM WNL bilaterally. Hearing returned to baseline with hearing aids in place.       No orders of the defined types were placed in this encounter.   Return if symptoms worsen or fail to improve.  Rubie Maid, FNP

## 2022-05-25 NOTE — Telephone Encounter (Signed)
Ailene Ravel, RN with [facility] called asking if patient's Guaifenesin dose can be increased to 10 ml instead of 5 ml? Thank you.

## 2022-05-26 ENCOUNTER — Other Ambulatory Visit: Payer: Self-pay

## 2022-05-26 ENCOUNTER — Other Ambulatory Visit (HOSPITAL_COMMUNITY): Payer: Self-pay

## 2022-05-26 DIAGNOSIS — R051 Acute cough: Secondary | ICD-10-CM

## 2022-05-26 DIAGNOSIS — J438 Other emphysema: Secondary | ICD-10-CM

## 2022-05-26 MED ORDER — GUAIFENESIN 100 MG/5ML PO LIQD
10.0000 mL | ORAL | 0 refills | Status: DC | PRN
  Filled 2022-05-26 – 2022-06-03 (×2): qty 118, 2d supply, fill #0

## 2022-05-30 ENCOUNTER — Other Ambulatory Visit (HOSPITAL_COMMUNITY): Payer: Self-pay

## 2022-05-31 ENCOUNTER — Other Ambulatory Visit (HOSPITAL_COMMUNITY): Payer: Self-pay

## 2022-06-01 ENCOUNTER — Other Ambulatory Visit (HOSPITAL_COMMUNITY): Payer: Self-pay

## 2022-06-01 ENCOUNTER — Other Ambulatory Visit: Payer: Self-pay | Admitting: Family Medicine

## 2022-06-03 ENCOUNTER — Other Ambulatory Visit: Payer: Self-pay | Admitting: Family Medicine

## 2022-06-03 ENCOUNTER — Ambulatory Visit: Payer: Self-pay | Admitting: *Deleted

## 2022-06-03 ENCOUNTER — Other Ambulatory Visit (HOSPITAL_COMMUNITY): Payer: Self-pay

## 2022-06-03 ENCOUNTER — Telehealth: Payer: Self-pay

## 2022-06-03 MED ORDER — CEPHALEXIN 500 MG PO CAPS: 500.0000 mg | ORAL_CAPSULE | Freq: Three times a day (TID) | ORAL | 0 refills | Status: DC

## 2022-06-03 NOTE — Telephone Encounter (Signed)
Call from [place] with Hospice. Pt had a large amount of sediment in urine today. Pt also has been a little more confused. Ailene Ravel asks if an antibiotic can be sent to [facility] [place]? Thank you.

## 2022-06-03 NOTE — Patient Outreach (Signed)
Care Coordination   Initial Visit Note   07/23/2022 late entry for 06/03/22 Name: Evan [name] MRN: [id number] DOB: 08-16-1927  Evan [name] is a 89 y.o. year old male who sees Pickard, Priscille Heidelberg, MD for primary care. I spoke with Evan [name], [name] (hard of hearing with memory issues) by phone today.  What matters to the patients health and wellness today?  Hard of hearing with history of needing annual winger compacted wax removal. Family interested in how to manage his memory issues at home (repetitive questions)   Hearing aid mostly in winter once a yr  No Audiologist   congestive Heart Failure (CHF) Give diuretic at night 40 mg, no swelling today,no weight checks as not steady when standing Has a catheter    Activities of daily living (ADLs)- need help bathing, Use walker  Appetite is good  On hospice  services -- hospice RN visits twice  Dementia no neurology    Goals Addressed             This Visit's Progress    COMPLETED: home management of memory loss & congestive heart failure   On track    Duplicate goal created      [facility] care coordination services       Interventions Today    Flowsheet Row Most Recent Value  Chronic Disease   Chronic disease during today's visit Other, Congestive Heart Failure (CHF)  [hard of hearing, memory concerns]  General Interventions   General Interventions Discussed/Reviewed General Interventions Discussed, Programmer, applications, Doctor Visits  Doctor Visits Discussed/Reviewed Doctor Visits Discussed  Exercise Interventions   Exercise Discussed/Reviewed Exercise Discussed, Physical Activity  Physical Activity Discussed/Reviewed Physical Activity Discussed  Education Interventions   Education Provided Provided Education  Mental Health Interventions   Mental Health Discussed/Reviewed Mental Health Discussed, Coping Strategies  Nutrition Interventions   Nutrition Discussed/Reviewed Nutrition Discussed, Fluid intake   Pharmacy Interventions   Pharmacy Dicussed/Reviewed Medications and their functions  Safety Interventions   Safety Discussed/Reviewed Home Safety, Safety Discussed  Home Safety Assistive Devices  Advanced Directive Interventions   Advanced Directives Discussed/Reviewed Advanced Directives Reviewed           COMPLETED: Track and Manage Fluids and Swelling-Heart Failure   On track    Timeframe:  Long-Range Goal Priority:  High Start Date: 02/01/21                                                  Duplicate goal created 3/1/124   - call office if I gain more than 2 pounds in one day or 5 pounds in one week - keep legs up while sitting - use salt in moderation - watch for swelling in feet, ankles and legs every day    Why is this important?   It is important to check your weight daily and watch how much salt and liquids you have.  It will help you to manage your heart failure.    Notes:         SDOH assessments and interventions completed:  Yes  SDOH Interventions Today    Flowsheet Row Most Recent Value  SDOH Interventions   Food Insecurity Interventions Intervention Not Indicated  Housing Interventions Intervention Not Indicated  Transportation Interventions Intervention Not Indicated  Utilities Interventions Intervention Not Indicated  Financial Strain Interventions Intervention  Not Indicated  Stress Interventions Intervention Not Indicated        Care Coordination Interventions:  Yes, provided   Follow up plan: Follow up call scheduled for 07/25/22    Encounter Outcome:  Pt. Visit Completed   Rosmery Duggin L. Noelle Penner, RN, BSN, CCM [facility] Care Management Community Coordinator Office number [phone]

## 2022-06-08 DIAGNOSIS — R3914 Feeling of incomplete bladder emptying: Secondary | ICD-10-CM | POA: Diagnosis not present

## 2022-06-08 DIAGNOSIS — R8279 Other abnormal findings on microbiological examination of urine: Secondary | ICD-10-CM | POA: Diagnosis not present

## 2022-06-14 ENCOUNTER — Telehealth: Payer: Self-pay | Admitting: Family Medicine

## 2022-06-14 NOTE — Telephone Encounter (Signed)
[place] to schedule their annual wellness visit. Appointment made for 06/30/2022.  Thank you,  Colletta Maryland,  [place] Program Direct Dial ??[phone]

## 2022-06-24 DIAGNOSIS — R3914 Feeling of incomplete bladder emptying: Secondary | ICD-10-CM | POA: Diagnosis not present

## 2022-06-26 ENCOUNTER — Other Ambulatory Visit: Payer: Self-pay

## 2022-06-28 ENCOUNTER — Other Ambulatory Visit (HOSPITAL_COMMUNITY): Payer: Self-pay

## 2022-06-28 ENCOUNTER — Encounter: Payer: Self-pay | Admitting: Family Medicine

## 2022-06-28 ENCOUNTER — Ambulatory Visit (INDEPENDENT_AMBULATORY_CARE_PROVIDER_SITE_OTHER): Payer: Medicare Other | Admitting: Family Medicine

## 2022-06-28 ENCOUNTER — Other Ambulatory Visit: Payer: Self-pay | Admitting: Family Medicine

## 2022-06-28 VITALS — BP 110/64 | HR 80 | Temp 97.9°F | Ht 63.0 in | Wt 160.0 lb

## 2022-06-28 DIAGNOSIS — I2782 Chronic pulmonary embolism: Secondary | ICD-10-CM | POA: Diagnosis not present

## 2022-06-28 DIAGNOSIS — N183 Chronic kidney disease, stage 3 unspecified: Secondary | ICD-10-CM | POA: Diagnosis not present

## 2022-06-28 MED ORDER — FOLIC ACID 1 MG PO TABS
1.0000 mg | ORAL_TABLET | Freq: Every day | ORAL | 1 refills | Status: DC
  Filled 2022-06-28 – 2022-07-29 (×3): qty 30, 30d supply, fill #0

## 2022-06-28 MED ORDER — FERROUS SULFATE 325 (65 FE) MG PO TABS
325.0000 mg | ORAL_TABLET | ORAL | 1 refills | Status: DC
  Filled 2022-06-28: qty 15, 30d supply, fill #0

## 2022-06-28 NOTE — Progress Notes (Signed)
Subjective:   Patient was diagnosed with a pulmonary embolism in February.  He is currently on Eliquis.  He has a history of an intracranial hemorrhage.  However, he is not following.  He is very sedentary and essentially sits in a wheelchair all day long.  He also has a history of congestive heart failure for which she takes Lasix 40 mg a day.  Today on exam there is no pitting edema in his extremities.  He has chronic right basilar crackles that are due to scar tissue but no evidence of fluid overload or pulmonary edema.  He does occasionally check drinking water and I believe he occasionally aspirates however he denies any fevers or chills.  He has a catheter in place but he is not having any back pain, delirium, or fevers to suggest a urinary tract infection.  He recently had anemia with a hemoglobin between 8 and 9 despite starting Eliquis.  We are monitoring that closely. 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   Anginal chest pain at rest    Chronic non, controlled on Lorazepam   Anxiety    Arthritis    "all over"    BPH (benign prostatic hyperplasia)    CAD (coronary artery disease)    Chronic lower back pain    Complication of anesthesia    "problems making water afterwards"   Depression    DJD (degenerative joint disease)    Esophageal ulcer without bleeding    bid ppi indefinitely   Family history of adverse reaction to anesthesia    "children w/PONV"   GERD (gastroesophageal reflux disease)    Headache    "couple/week maybe" (09/04/2015)   Hemochromatosis    Possible, elevated iron stores, hook-like osteophytes on hand films, normal LFTs   Hepatitis    "yellow jaundice as a baby"   History of ASCVD    MULTIVESSEL   Hyperlipidemia    Hypertension    Mild aortic stenosis 06/23/2021   Osteoarthritis    Pneumonia    "several times; got a little now" (09/04/2015)   Rheumatoid arthritis ([place])    Subdural hematoma ([place])    small  after fall 09/2016-plavix held, neurosurgery consulted.  Asymptomatic.     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[place]) 02/2018   on Lovenox lifelong since he failed PO Eliquis   Past Surgical History:  Procedure Laterality Date   ARM SKIN LESION BIOPSY / EXCISION Left 09/02/2015   CATARACT EXTRACTION W/ INTRAOCULAR LENS  IMPLANT, BILATERAL Bilateral    CHOLECYSTECTOMY OPEN     CORNEAL TRANSPLANT Bilateral    "one at [facility]; one at [facility]"   [place] W/ STENT     DE stent ostium into the right radial free graft at [facility]-, 02-2007   ESOPHAGOGASTRODUODENOSCOPY N/A 11/09/2014   Procedure: ESOPHAGOGASTRODUODENOSCOPY (EGD);  Surgeon: [name], MD;  Location: [facility] ENDOSCOPY;  Service: Endoscopy;  Laterality: N/A;   INGUINAL HERNIA REPAIR     JOINT REPLACEMENT     LEFT HEART CATH AND CORS/GRAFTS ANGIOGRAPHY N/A 06/27/2017   Procedure: LEFT HEART CATH AND CORS/GRAFTS ANGIOGRAPHY;  Surgeon: [name], MD;  Location: [place] CV LAB;  Service: Cardiovascular;  Laterality: N/A;   NASAL SINUS SURGERY     SHOULDER OPEN ROTATOR CUFF REPAIR Right    TOTAL KNEE ARTHROPLASTY Bilateral    Current Outpatient Medications on File Prior  to Visit  Medication Sig Dispense Refill   acetaminophen (TYLENOL) 500 MG tablet Take 500 mg by mouth 2 (two) times daily as needed for mild pain, moderate pain, fever or headache.     acetaminophen (TYLENOL) 500 MG tablet Take 500 mg by mouth 2 (two) times daily.     albuterol (PROVENTIL) (2.5 MG/3ML) 0.083% nebulizer solution Take 3 mLs (2.5 mg total) by nebulization every 4 (four) hours as needed for wheezing or shortness of breath. 75 mL 12   apixaban (ELIQUIS) 2.5 MG TABS tablet Take 1 tablet (2.5 mg total) by mouth 2 (two) times daily. 60 tablet 1   budesonide-formoterol (SYMBICORT) 160-4.5 MCG/ACT inhaler Inhale 2 puffs into the lungs in the morning and at bedtime. 10.2 g 12    cephALEXin (KEFLEX) 500 MG capsule Take 1 capsule (500 mg total) by mouth 3 (three) times daily. 21 capsule 0   Cholecalciferol (VITAMIN D-3 PO) Take 1 capsule by mouth in the morning.     cyanocobalamin 1000 MCG tablet Take 1 tablet (1,000 mcg total) by mouth daily. 30 tablet 1   ferrous sulfate 325 (65 FE) MG tablet Take 1 tablet (325 mg total) by mouth every other day. 15 tablet 1   finasteride (PROSCAR) 5 MG tablet Take 1 tablet (5 mg total) by mouth daily. 90 tablet 3   FLUoxetine (PROZAC) 20 MG capsule TAKE 1 CAPSULE EVERY DAY 90 capsule 3   folic acid (FOLVITE) 1 MG tablet Take 1 tablet (1 mg total) by mouth daily. 30 tablet 1   guaiFENesin (ROBITUSSIN) 100 MG/5ML liquid Take 10 mLs by mouth every 4 (four) hours as needed for cough or to loosen phlegm. 118 mL 0   Lifitegrast (XIIDRA) 5 % SOLN Place 1 drop into both eyes 2 (two) times daily. (Patient taking differently: Place 1 drop into both eyes as needed (dry eyes).) 60 each 12   pantoprazole (PROTONIX) 40 MG tablet Take 1 tablet (40 mg total) by mouth in the morning. 90 tablet 0   polyethylene glycol (MIRALAX / GLYCOLAX) 17 g packet Take 17 g by mouth daily as needed for mild constipation. 14 each 0   rosuvastatin (CRESTOR) 5 MG tablet TAKE 1 TABLET EVERY DAY (Patient taking differently: Take 5 mg by mouth at bedtime.) 90 tablet 1   tacrolimus (PROTOPIC) 0.1 % ointment Apply topically 2 (two) times daily.     tamsulosin (FLOMAX) 0.4 MG CAPS capsule TAKE 1 CAPSULE EVERY DAY (Patient taking differently: Take 0.4 mg by mouth daily with supper.) 90 capsule 10   Zinc Oxide (TRIPLE PASTE) 12.8 % ointment Apply topically 4 (four) times daily as needed (skin barrier). (Patient taking differently: Apply 1 Application topically 2 (two) times daily as needed (skin barrier).) 56.7 g 0   furosemide (LASIX) 40 MG tablet Take 1 tablet (40 mg total) by mouth daily. 30 tablet 0   No current facility-administered medications on file prior to visit.    Allergies  Allergen Reactions   Neomycin-Polymyxin-Hc Itching   Piroxicam Rash and Other (See Comments)    Blistering rash  REACTION: Blisters, REACTION: Blisters   Rosuvastatin Calcium Other (See Comments)    Myalgias   Statins Other (See Comments)    Myalgias   Diazepam Other (See Comments)    Dizziness  REACTION:  unknown, REACTION:  unknown   Doxycycline Nausea Only, Itching and Other (See Comments)    REACTION:  unknown, REACTION:  unknown   Gatifloxacin Anxiety and Swelling   Latex Rash  Social History   Socioeconomic History   Marital status: Widowed    Spouse name: Not on file   Number of children: Not on file   Years of education: Not on file   Highest education level: Not on file  Occupational History   Not on file  Tobacco Use   Smoking status: Never    Passive exposure: Yes   Smokeless tobacco: Former    Types: Chew   Tobacco comments:    "quit chewing in the 1960s"  Vaping Use   Vaping Use: Never used  Substance and Sexual Activity   Alcohol use: No   Drug use: No   Sexual activity: Never  Other Topics Concern   Not on file  Social History Narrative   Not on file   Social Determinants of Health   Financial Resource Strain: Low Risk  (06/03/2022)   Overall Financial Resource Strain (CARDIA)    Difficulty of Paying Living Expenses: Not hard at all  Food Insecurity: No Food Insecurity (06/03/2022)   Hunger Vital Sign    Worried About Running Out of Food in the Last Year: Never true    [place] in the Last Year: Never true  Transportation Needs: No Transportation Needs (06/03/2022)   PRAPARE - Hydrologist (Medical): No    Lack of Transportation (Non-Medical): No  Physical Activity: Insufficiently Active (05/07/2021)   Exercise Vital Sign    Days of Exercise per Week: 3 days    Minutes of Exercise per Session: 20 min  Stress: No Stress Concern Present (06/03/2022)   [place]    Feeling of Stress : Only a little  Social Connections: Socially Isolated (05/07/2021)   Social Connection and Isolation Panel [NHANES]    Frequency of Communication with Friends and Family: More than three times a week    Frequency of Social Gatherings with Friends and Family: More than three times a week    Attends Religious Services: Never    Marine scientist or Organizations: No    Attends Archivist Meetings: Never    Marital Status: Widowed  Intimate Partner Violence: Not At Risk (04/28/2022)   Humiliation, Afraid, Rape, and Kick questionnaire    Fear of Current or Ex-Partner: No    Emotionally Abused: No    Physically Abused: No    Sexually Abused: No      Review of Systems     Objective:   Physical Exam Vitals reviewed.  Constitutional:      General: He is not in acute distress.    Appearance: Normal appearance. He is not toxic-appearing or diaphoretic.  Cardiovascular:     Rate and Rhythm: Normal rate and regular rhythm.     Heart sounds: Normal heart sounds. No murmur heard.    No friction rub. No gallop.  Pulmonary:     Effort: Pulmonary effort is normal. No respiratory distress.     Breath sounds: Rales present. No wheezing or rhonchi.    Musculoskeletal:     Right lower leg: No edema.     Left lower leg: No edema.  Neurological:     Mental Status: He is alert.   Right-sided hemiparesis with weakness in his right leg.  Weakness with knee flexion and extension and ankle flexion and extension.  Ataxia in the right upper arm and right lower leg.         Assessment & Plan:  Chronic pulmonary embolism without acute cor pulmonale, unspecified pulmonary embolism type ([place]) - Plan: CBC with Differential/Platelet, BASIC METABOLIC PANEL WITH GFR Patient is appropriately anticoagulated with Eliquis.  Monitor hemoglobin for any substantial drop.  If hemoglobin is less than 8, we will need to discontinue his  anticoagulant due to an occult bleed.  Monitor renal function and potassium on furosemide.  If labs are stable, the patient is subjectively at his baseline.  I would make no changes at the present time assuming the lab work is stable.  I primarily wanted to monitor his anemia of chronic disease given that he has been on anticoagulation now for more than a month

## 2022-06-29 ENCOUNTER — Other Ambulatory Visit (HOSPITAL_COMMUNITY): Payer: Self-pay

## 2022-06-29 LAB — CBC WITH DIFFERENTIAL/PLATELET
Absolute Monocytes: 821 cells/uL (ref 200–950)
Basophils Absolute: 41 cells/uL (ref 0–200)
Basophils Relative: 0.6 %
Eosinophils Absolute: 104 cells/uL (ref 15–500)
Eosinophils Relative: 1.5 %
HCT: 32.8 % — ABNORMAL LOW (ref 38.5–50.0)
Hemoglobin: 10.8 g/dL — ABNORMAL LOW (ref 13.2–17.1)
Lymphs Abs: 1152 cells/uL (ref 850–3900)
MCH: 30.6 pg (ref 27.0–33.0)
MCHC: 32.9 g/dL (ref 32.0–36.0)
MCV: 92.9 fL (ref 80.0–100.0)
MPV: 11.2 fL (ref 7.5–12.5)
Monocytes Relative: 11.9 %
Neutro Abs: 4782 cells/uL (ref 1500–7800)
Neutrophils Relative %: 69.3 %
Platelets: 119 10*3/uL — ABNORMAL LOW (ref 140–400)
RBC: 3.53 10*6/uL — ABNORMAL LOW (ref 4.20–5.80)
RDW: 16.7 % — ABNORMAL HIGH (ref 11.0–15.0)
Total Lymphocyte: 16.7 %
WBC: 6.9 10*3/uL (ref 3.8–10.8)

## 2022-06-29 LAB — BASIC METABOLIC PANEL WITH GFR
BUN/Creatinine Ratio: 23 (calc) — ABNORMAL HIGH (ref 6–22)
BUN: 27 mg/dL — ABNORMAL HIGH (ref 7–25)
CO2: 27 mmol/L (ref 20–32)
Calcium: 9.3 mg/dL (ref 8.6–10.3)
Chloride: 102 mmol/L (ref 98–110)
Creat: 1.17 mg/dL (ref 0.70–1.22)
Glucose, Bld: 107 mg/dL — ABNORMAL HIGH (ref 65–99)
Potassium: 3.8 mmol/L (ref 3.5–5.3)
Sodium: 137 mmol/L (ref 135–146)
eGFR: 58 mL/min/{1.73_m2} — ABNORMAL LOW (ref >=60)

## 2022-06-30 ENCOUNTER — Encounter: Payer: Self-pay | Admitting: *Deleted

## 2022-06-30 ENCOUNTER — Ambulatory Visit: Payer: Self-pay | Admitting: *Deleted

## 2022-06-30 NOTE — Patient Outreach (Signed)
  Care Coordination   Initial Visit Note   06/30/2022  Name: Evan [name] MRN: [id number] DOB: September 25, 1927  Evan [name] is a 89 y.o. year old male who sees Pickard, Cammie Mcgee, MD for primary care. I spoke with daughter, Andreas Blower by phone today.  What matters to the patients health and wellness today?   No interventions identified.  Patient denies need for social work involvement at this time.  SDOH assessments and interventions completed:  Yes.  SDOH Interventions Today    Flowsheet Row Most Recent Value  SDOH Interventions   Food Insecurity Interventions Intervention Not Indicated  [Verified by Daughter, [place] Interventions Intervention Not Indicated  [Verified by Daughter, [name] [name]]  Transportation Interventions Intervention Not Indicated, Patient Resources (Friends/Family), Payor Benefit  [Verified by Daughter, [name] [name]]  Utilities Interventions Intervention Not Indicated  [Verified by Daughter, [name] [name]]  Alcohol Usage Interventions Intervention Not Indicated (Score <7)  [Verified by Daughter, [name] [name]]  Financial Strain Interventions Intervention Not Indicated  [Verified by Daughter, [name] [name]]  Physical Activity Interventions Intervention Not Indicated, Patient Refused  [Verified by Daughter, [name] [name]]  Stress Interventions Intervention Not Indicated  [Verified by Daughter, [name] [name]]  Social Connections Interventions Intervention Not Indicated  [Verified by Daughter, [name] [name]]     Care Coordination Interventions:  No, not indicated.   Follow up plan: No further intervention required.   Encounter Outcome:  Pt. Visit Completed.   Nat Christen, BSW, MSW, LCSW  Licensed Education officer, [facility]  Mailing [place] [address], [place], [place] [address] Physical Address-[address] [place], [place] [address] Toll Free Main #  [phone] Fax # [phone] Cell # [phone] [email or web address]

## 2022-06-30 NOTE — Patient Instructions (Signed)
Visit Information  Thank you for taking time to visit with me today. Please don't hesitate to contact me if I can be of assistance to you.   Please call the care guide team at [phone] if you need to cancel or reschedule your appointment.   If you are experiencing a Mental Health or Behavioral Health Crisis or need someone to talk to, please call the Suicide and Crisis Lifeline: 988 call the [place] National Suicide Prevention Lifeline: 1-800-273-8255 or TTY: 1-800-799-4 TTY (1-800-799-4889) to talk to a trained counselor call 1-800-273-TALK (toll free, 24 hour hotline) go to [facility] [address], [place] ([phone]) call the [place] Crisis Line: 800-939-9988 call 911  Patient verbalizes understanding of instructions and care plan provided today and agrees to view in MyChart. Active MyChart status and patient understanding of how to access instructions and care plan via MyChart confirmed with patient.     No further follow up required.  Robi Dewolfe, BSW, MSW, LCSW  Licensed Clinical Social Worker  [facility] Care Management [place] [facility]  Mailing Address-[address], [place], [place] [address] Physical Address-[address], [place], [place] [address] Toll Free Main # [phone] Fax # [phone] Cell # [phone] [email or web address]            

## 2022-07-01 ENCOUNTER — Other Ambulatory Visit: Payer: Self-pay | Admitting: Family Medicine

## 2022-07-01 NOTE — Telephone Encounter (Signed)
Requested Prescriptions  Pending Prescriptions Disp Refills   pantoprazole (PROTONIX) 40 MG tablet [Pharmacy Med Name: PANTOPRAZOLE SODIUM 40 MG Tablet Delayed Release] 90 tablet 3    Sig: TAKE 1 TABLET EVERY MORNING     Gastroenterology: Proton Pump Inhibitors Passed - 07/01/2022  1:53 AM      Passed - Valid encounter within last 12 months    Recent Outpatient Visits           10 months ago Acute pain of right shoulder   [place] Pickard, Cammie Mcgee, MD   11 months ago [place] [name], MD   1 year ago Acute systolic congestive heart failure [facility])   [place] [name], MD   1 year ago Acute cough   [place] Eulogio Bear, NP   1 year ago Need for immunization against influenza   [place], Cammie Mcgee, MD

## 2022-07-05 ENCOUNTER — Other Ambulatory Visit: Payer: Self-pay | Admitting: Family Medicine

## 2022-07-05 ENCOUNTER — Other Ambulatory Visit (HOSPITAL_COMMUNITY): Payer: Self-pay

## 2022-07-05 MED ORDER — APIXABAN 2.5 MG PO TABS
2.5000 mg | ORAL_TABLET | Freq: Two times a day (BID) | ORAL | 1 refills | Status: DC
  Filled 2022-07-05: qty 60, 30d supply, fill #0
  Filled 2022-08-01: qty 60, 30d supply, fill #1

## 2022-07-05 NOTE — Telephone Encounter (Signed)
Requested medications are due for refill today.  Unsure  Requested medications are on the active medications list.  yes  Last refill. 05/03/2022 #60 1 rf  Future visit scheduled.   no  Notes to clinic.  Rx written to expired 07/02/2022 - Rx is expired.    Requested Prescriptions  Pending Prescriptions Disp Refills   apixaban (ELIQUIS) 2.5 MG TABS tablet 60 tablet 1    Sig: Take 1 tablet (2.5 mg total) by mouth 2 (two) times daily.     Hematology:  Anticoagulants - apixaban Failed - 07/05/2022  1:54 PM      Failed - PLT in normal range and within 360 days    Platelets  Date Value Ref Range Status  06/28/2022 119 (L) 140 - 400 Thousand/uL Final  11/01/2021 129 (L) 150 - 450 x10E3/uL Final         Failed - HGB in normal range and within 360 days    Hemoglobin  Date Value Ref Range Status  06/28/2022 10.8 (L) 13.2 - 17.1 g/dL Final  11/01/2021 10.6 (L) 13.0 - 17.7 g/dL Final   HGB  Date Value Ref Range Status  08/16/2016 13.4 13.0 - 17.1 g/dL Final         Failed - HCT in normal range and within 360 days    HCT  Date Value Ref Range Status  06/28/2022 32.8 (L) 38.5 - 50.0 % Final  08/16/2016 39.8 38.4 - 49.9 % Final   Hematocrit  Date Value Ref Range Status  11/01/2021 31.0 (L) 37.5 - 51.0 % Final         Passed - Cr in normal range and within 360 days    Creatinine  Date Value Ref Range Status  08/16/2016 1.2 0.7 - 1.3 mg/dL Final   Creat  Date Value Ref Range Status  06/28/2022 1.17 0.70 - 1.22 mg/dL Final         Passed - AST in normal range and within 360 days    AST  Date Value Ref Range Status  05/11/2022 27 15 - 41 U/L Final  08/16/2016 18 5 - 34 U/L Final         Passed - ALT in normal range and within 360 days    ALT  Date Value Ref Range Status  05/11/2022 11 0 - 44 U/L Final  08/16/2016 10 0 - 55 U/L Final         Passed - Valid encounter within last 12 months    Recent Outpatient Visits           11 months ago Acute pain of right  shoulder   [place] [name], MD   12 months ago [place], Warren T, MD   1 year ago Acute systolic congestive heart failure ([place])   [place] [name], MD   1 year ago Acute cough   [place] Eulogio Bear, NP   1 year ago Need for immunization against influenza   [place] Pickard, Cammie Mcgee, MD

## 2022-07-07 ENCOUNTER — Other Ambulatory Visit: Payer: Self-pay

## 2022-07-07 ENCOUNTER — Telehealth: Payer: Self-pay

## 2022-07-07 DIAGNOSIS — R5382 Chronic fatigue, unspecified: Secondary | ICD-10-CM

## 2022-07-07 DIAGNOSIS — R531 Weakness: Secondary | ICD-10-CM

## 2022-07-07 DIAGNOSIS — R5383 Other fatigue: Secondary | ICD-10-CM

## 2022-07-07 MED ORDER — FERROUS SULFATE 325 (65 FE) MG PO TABS: 325.0000 mg | ORAL_TABLET | ORAL | 1 refills | Status: DC

## 2022-07-07 NOTE — Telephone Encounter (Signed)
Prescription Request  07/07/2022  LOV: 06/28/22  What is the name of the medication or equipment? Rx #: [id number]  ferrous sulfate (FEROSUL) 325 (65 FE) MG tablet [id number]    Have you contacted your pharmacy to request a refill? Yes   Which pharmacy would you like this sent to?  [place], [place] [address] Phone: [phone] Fax: [phone]    Patient notified that their request is being sent to the clinical staff for review and that they should receive a response within 2 business days.   Please advise at [facility] [phone]  Prescription Request  07/07/2022  LOV: 06/28/22  What is the name of the medication or equipment? Rx #: [id number]  folic acid (FOLVITE) 1 MG tablet [id number]   Have you contacted your pharmacy to request a refill? Yes   Which pharmacy would you like this sent to?  [place], [place] [address] Phone: [phone] Fax: [phone]    Patient notified that their request is being sent to the clinical staff for review and that they should receive a response within 2 business days.   Please advise at [facility] [phone]  Prescription Request  07/07/2022  LOV: 06/28/22  What is the name of the medication or equipment? Rx #: [id number]  apixaban (ELIQUIS) 2.5 MG TABS tablet [id number]   Have you contacted your pharmacy to request a refill? Yes   Which pharmacy would you like this sent to?  [place], [place] [address] Phone: [phone] Fax: [phone]    Patient notified that their request is being sent to the clinical staff for review and that they should receive a response within 2 business days.   Please advise at [facility] [phone]  Prescription  Request  07/07/2022  LOV:06/28/22  What is the name of the medication or equipment? Rx #: [id number]  apixaban (ELIQUIS) 2.5 MG TABS tablet [id number]   Have you contacted your pharmacy to request a refill? Yes   Which pharmacy would you like this sent to?  [place], [place] [address] Phone: [phone] Fax: [phone]    Patient notified that their request is being sent to the clinical staff for review and that they should receive a response within 2 business days.   Please advise at [place]  Prescription Request

## 2022-07-08 ENCOUNTER — Other Ambulatory Visit: Payer: Self-pay | Admitting: Family Medicine

## 2022-07-08 ENCOUNTER — Other Ambulatory Visit (HOSPITAL_COMMUNITY): Payer: Self-pay

## 2022-07-19 NOTE — Patient Instructions (Signed)
Visit Information  Thank you for taking time to visit with me today. Please don't hesitate to contact me if I can be of assistance to you.   Following are the goals we discussed today:   Goals Addressed             This Visit's Progress    COMPLETED: home management of memory loss & congestive heart failure   On track    Duplicate goal created      [facility] care coordination services       Interventions Today    Flowsheet Row Most Recent Value  Chronic Disease   Chronic disease during today's visit Other, Congestive Heart Failure (CHF)  [hard of hearing, memory concerns]  General Interventions   General Interventions Discussed/Reviewed General Interventions Discussed, Programmer, applications, Doctor Visits  Doctor Visits Discussed/Reviewed Doctor Visits Discussed  Exercise Interventions   Exercise Discussed/Reviewed Exercise Discussed, Physical Activity  Physical Activity Discussed/Reviewed Physical Activity Discussed  Education Interventions   Education Provided Provided Education  Mental Health Interventions   Mental Health Discussed/Reviewed Mental Health Discussed, Coping Strategies  Nutrition Interventions   Nutrition Discussed/Reviewed Nutrition Discussed, Fluid intake  Pharmacy Interventions   Pharmacy Dicussed/Reviewed Medications and their functions  Safety Interventions   Safety Discussed/Reviewed Home Safety, Safety Discussed  Home Safety Assistive Devices  Advanced Directive Interventions   Advanced Directives Discussed/Reviewed Advanced Directives Reviewed           COMPLETED: Track and Manage Fluids and Swelling-Heart Failure   On track    Timeframe:  Long-Range Goal Priority:  High Start Date: 02/01/21                                                  Duplicate goal created 3/1/124   - call office if I gain more than 2 pounds in one day or 5 pounds in one week - keep legs up while sitting - use salt in moderation - watch for swelling in feet, ankles and  legs every day    Why is this important?   It is important to check your weight daily and watch how much salt and liquids you have.  It will help you to manage your heart failure.    Notes:         Our next appointment is by telephone on 07/25/22 at 1045  Please call the care guide team at [phone] if you need to cancel or reschedule your appointment.   If you are experiencing a Mental Health or Behavioral Health Crisis or need someone to talk to, please call the Suicide and Crisis Lifeline: 988 call the [place] National Suicide Prevention Lifeline: 501-286-2716 or TTY: 484-453-0667 TTY (380)826-3002) to talk to a trained counselor call 1-800-273-TALK (toll free, 24 hour hotline) go to [facility] [address], [place] [phone]) call the [facility] Crisis Line: (986)613-2924 call 911   Patient verbalizes understanding of instructions and care plan provided today and agrees to view in MyChart. Active MyChart status and patient understanding of how to access instructions and care plan via MyChart confirmed with patient.     The patient has been provided with contact information for the care management team and has been advised to call with any health related questions or concerns.   Moani Weipert L. Noelle Penner, RN, BSN, CCM [facility] Care Management Community Coordinator  Office number [phone]

## 2022-07-21 ENCOUNTER — Other Ambulatory Visit (HOSPITAL_COMMUNITY): Payer: Self-pay

## 2022-07-21 MED ORDER — SENNOSIDES-DOCUSATE SODIUM 8.6-50 MG PO TABS: 2.0000 | ORAL_TABLET | Freq: Two times a day (BID) | ORAL | 2 refills | Status: DC | PRN

## 2022-07-22 ENCOUNTER — Other Ambulatory Visit (HOSPITAL_COMMUNITY): Payer: Self-pay

## 2022-07-22 DIAGNOSIS — R3914 Feeling of incomplete bladder emptying: Secondary | ICD-10-CM | POA: Diagnosis not present

## 2022-07-25 ENCOUNTER — Ambulatory Visit: Payer: Self-pay | Admitting: *Deleted

## 2022-07-25 NOTE — Patient Outreach (Signed)
  Care Coordination   Follow Up Visit Note   07/25/2022 Name: Evan [name] MRN: [id number] DOB: 06-02-1927  Evan [name] is a 89 y.o. year old male who sees Pickard, Priscille Heidelberg, MD for primary care. I spoke with daughter, Evan [name] of Evan [name] by phone today.  What matters to the patients health and wellness today?  Chronic pain in big toe, hurts only when movement, no treatment plan history of gout Denies because   Does not have podiatry (prefer [place] [address] [place])  He did have his ear wax removed since last outreach    Goals Addressed             This Visit's Progress    [facility] care coordination services   Not on track    Interventions Today    Flowsheet Row Most Recent Value  Chronic Disease   Chronic disease during today's visit Other  [big toe pain, ear wax, specialists]  General Interventions   General Interventions Discussed/Reviewed General Interventions Reviewed, Annual Foot Exam, Communication with, Doctor Visits  Doctor Visits Discussed/Reviewed Doctor Visits Reviewed, PCP, Specialist  PCP/Specialist Visits Compliance with follow-up visit  Communication with RN, PCP/Specialists  [message to pcp for podiatry referral for toe fungus]  Exercise Interventions   Physical Activity Discussed/Reviewed Physical Activity Reviewed  Education Interventions   Education Provided Provided Education, Provided Web-based Education  [toe fungal infection, Podiatry]  Provided Verbal Education On Foot Care, Other, When to see the doctor  Pharmacy Interventions   Pharmacy Dicussed/Reviewed Pharmacy Topics Reviewed, Medications and their functions              SDOH assessments and interventions completed:  Yes  SDOH Interventions Today    Flowsheet Row Most Recent Value  SDOH Interventions   Food Insecurity Interventions Intervention Not Indicated  Housing Interventions Intervention Not Indicated  Transportation Interventions Intervention Not Indicated   Utilities Interventions Intervention Not Indicated  Financial Strain Interventions Intervention Not Indicated  Stress Interventions Intervention Not Indicated        Care Coordination Interventions:  Yes, provided   Follow up plan: Follow up call scheduled for 08/11/22    Encounter Outcome:  Pt. Visit Completed   Evan [name] L. Noelle Penner, RN, BSN, CCM [facility] Care Management Community Coordinator Office number [phone]

## 2022-07-25 NOTE — Patient Instructions (Addendum)
Visit Information  Thank you for taking time to visit with me today. Please don't hesitate to contact me if I can be of assistance to you.   Following are the goals we discussed today:   Goals Addressed             This Visit's Progress    [facility] care coordination services   Not on track    Interventions Today    Flowsheet Row Most Recent Value  Chronic Disease   Chronic disease during today's visit Other  [big toe pain, ear wax, specialists]  General Interventions   General Interventions Discussed/Reviewed General Interventions Reviewed, Annual Foot Exam, Communication with, Doctor Visits  Doctor Visits Discussed/Reviewed Doctor Visits Reviewed, PCP, Specialist  PCP/Specialist Visits Compliance with follow-up visit  Communication with RN, PCP/Specialists  [message to pcp for podiatry referral for toe fungus]  Exercise Interventions   Physical Activity Discussed/Reviewed Physical Activity Reviewed  Education Interventions   Education Provided Provided Education, Provided Web-based Education  [toe fungal infection, Podiatry]  Provided Verbal Education On Foot Care, Other, When to see the doctor  Pharmacy Interventions   Pharmacy Dicussed/Reviewed Pharmacy Topics Reviewed, Medications and their functions              Our next appointment is by telephone on 08/11/22 at  2 pm  Please call the care guide team at [phone] if you need to cancel or reschedule your appointment.   If you are experiencing a Mental Health or Behavioral Health Crisis or need someone to talk to, please call the Suicide and Crisis Lifeline: 988 call the [place] National Suicide Prevention Lifeline: 346 338 6494 or TTY: (907) 868-7407 TTY 574-004-3762) to talk to a trained counselor call 1-800-273-TALK (toll free, 24 hour hotline) call the [facility]: (978) [phone] call 911   Patient verbalizes understanding of instructions and care plan provided today and agrees to view in MyChart.  Active MyChart status and patient understanding of how to access instructions and care plan via MyChart confirmed with patient.     The patient has been provided with contact information for the care management team and has been advised to call with any health related questions or concerns.   Chrysten Woulfe L. Noelle Penner, RN, BSN, CCM [facility] Care Management Community Coordinator Office number [phone]

## 2022-07-26 ENCOUNTER — Telehealth: Payer: Self-pay

## 2022-07-26 NOTE — Telephone Encounter (Signed)
Staff message from Edd Arbour, RN:  Clinton Gallant, RN  [facility] Clinical Pool Replies will be sent to [facility] Clinical [facility] Daughter voiced interest in podiatry for toe fungus. Reviewed in network providers. Daughter voiced Interested in either of these [facility] [place] ([address] podiatrist- Max [place], Nicholes Rough, Trudi Ida. Gwyneth Revels, [facility] [place] or [facility] [employer] [facility] [place] L. Noelle Penner, RN, BSN, CCM [facility] Care Management Community Coordinator [phone]

## 2022-07-28 ENCOUNTER — Other Ambulatory Visit: Payer: Self-pay

## 2022-07-28 DIAGNOSIS — B351 Tinea unguium: Secondary | ICD-10-CM

## 2022-07-29 ENCOUNTER — Other Ambulatory Visit: Payer: Self-pay

## 2022-07-29 ENCOUNTER — Other Ambulatory Visit (HOSPITAL_COMMUNITY): Payer: Self-pay

## 2022-08-03 ENCOUNTER — Telehealth: Payer: Self-pay | Admitting: Pharmacist

## 2022-08-03 NOTE — Progress Notes (Signed)
Care Management & Coordination Services Pharmacy Team  Reason for Encounter: Appointment Reminder  Contacted patient to confirm telephone appointment with Erskine Emery, PharmD on 08/04/2022 at 2:45 pm. Spoke with family on 08/03/2022    Star Rating Drugs:  Rosuvastatin 5 mg last filled 07/21/2022 90 DS  Care Gaps: Annual wellness visit in last year? No  Future Appointments  Date Time Provider Department Center  08/04/2022  2:45 PM Erroll Luna, [facility] None  08/09/2022  9:30 AM Louann Sjogren, DPM [facility]  08/11/2022  2:00 PM Clinton Gallant, RN [facility] None   April D Calhoun, [facility] Clinical Pharmacist Assistant [phone]

## 2022-08-04 ENCOUNTER — Ambulatory Visit: Payer: Medicare HMO | Admitting: Pharmacist

## 2022-08-04 NOTE — Progress Notes (Signed)
Care Management & Coordination Services Pharmacy Note  08/05/2022 Name:  Evan [name] MRN:  [id number] DOB:  February 14, 1928  Summary: Pharmd FU visit.  FU on HF - patient denies any recent swelling or shortness of breath.  Has not had to take any extra Lasix recently.  Does have plan in place should they notice any weight gain, swelling, SOB.  Recommendations/Changes made from today's visit: No changes to meds  Follow up plan: Fu 3 months on phone   Subjective: Evan [name] is an 89 y.o. year old male who is a primary patient of Pickard, Priscille Heidelberg, MD.  The care coordination team was consulted for assistance with disease management and care coordination needs.    Engaged with patient by telephone for follow up visit.  Recent office visits:  11/25/21 Lynnea Ferrier. MD - Family Medicine - Intertrigo - fluconazole (DIFLUCAN) 150 MG tablet prescribed. Recheck in 4 weeks.    Recent consult visits:  11/01/21 Vilma Meckel, MD - Pulmonology - Chronic cough - Swallow study ordered. Follow up as scheduled.    11/01/21 Lance Muss, MD - Cardiology - A Fib - Labs were ordered. furosemide (LASIX) 20 MG tablet ordered.    Hospital visits:  None in previous 6 months   Objective:  Lab Results  Component Value Date   CREATININE 1.17 06/28/2022   BUN 27 (H) 06/28/2022   GFR 56.40 (L) 06/21/2017   EGFR 58 (L) 06/28/2022   GFRNONAA 42 (L) 05/17/2022   GFRAA 54 (L) 06/22/2020   NA 137 06/28/2022   K 3.8 06/28/2022   CALCIUM 9.3 06/28/2022   CO2 27 06/28/2022   GLUCOSE 107 (H) 06/28/2022    Lab Results  Component Value Date/Time   HGBA1C 5.3 12/02/2016 04:32 PM   GFR 56.40 (L) 06/21/2017 04:17 PM   GFR 63.82 06/14/2017 10:10 AM    Last diabetic Eye exam: No results found for: "HMDIABEYEEXA"  Last diabetic Foot exam: No results found for: "HMDIABFOOTEX"   Lab Results  Component Value Date   CHOL 91 (L) 06/22/2020   HDL 35 (L) 06/22/2020   LDLCALC 41 06/22/2020   TRIG 66  06/22/2020   CHOLHDL 2.6 06/22/2020       Latest Ref Rng & Units 05/11/2022   11:01 PM 05/10/2022    4:16 PM 05/02/2022    2:11 AM  Hepatic Function  Total Protein 6.5 - 8.1 g/dL 6.8  7.2  6.5   Albumin 3.5 - 5.0 g/dL 3.3   3.2   AST 15 - 41 U/L 27  17  17    ALT 0 - 44 U/L 11  8  9    Alk Phosphatase 38 - 126 U/L 78   84   Total Bilirubin 0.3 - 1.2 mg/dL 0.4  0.7  0.7     Lab Results  Component Value Date/Time   TSH 2.850 06/22/2020 09:53 AM   TSH 1.378 08/23/2018 02:36 AM   TSH 1.61 01/17/2017 11:15 AM   FREET4 0.76 12/20/2016 04:51 PM       Latest Ref Rng & Units 06/28/2022    9:35 AM 05/14/2022    1:03 AM 05/12/2022    5:29 AM  CBC  WBC 3.8 - 10.8 Thousand/uL 6.9  6.5  4.5   Hemoglobin 13.2 - 17.1 g/dL 78.2  8.9  9.0   Hematocrit 38.5 - 50.0 % 32.8  26.8  27.4   Platelets 140 - 400 Thousand/uL 119  133  104     Lab Results  Component Value Date/Time   VD25OH 27 (L) 09/21/2015 09:40 AM   VITAMINB12 264 05/02/2022 02:11 AM   VITAMINB12 509 08/23/2018 02:36 AM    Clinical ASCVD: Yes  The ASCVD Risk score (Arnett DK, et al., 2019) failed to calculate for the following reasons:   The 2019 ASCVD risk score is only valid for ages 98 to 35   The patient has a prior MI or stroke diagnosis        07/25/2022    6:23 PM 06/30/2022   12:29 PM 06/03/2022   11:57 AM  Depression screen PHQ 2/9  Decreased Interest 0 0 1  Down, Depressed, Hopeless 0 0 0  PHQ - 2 Score 0 0 1     Social History   Tobacco Use  Smoking Status Former   Types: Cigarettes   Passive exposure: Yes  Smokeless Tobacco Former   Types: Chew  Tobacco Comments   "quit chewing in the 1960s"   Verified by Daughter, [name]   BP Readings from Last 3 Encounters:  06/28/22 110/64  05/25/22 120/80  05/17/22 (!) 110/55   Pulse Readings from Last 3 Encounters:  06/28/22 80  05/25/22 84  05/17/22 78   Wt Readings from Last 3 Encounters:  06/28/22 160 lb (72.6 kg)  05/25/22 160 lb (72.6 kg)   05/15/22 147 lb 11.3 oz (67 kg)   BMI Readings from Last 3 Encounters:  06/28/22 28.34 kg/m  05/25/22 28.34 kg/m  05/15/22 26.17 kg/m    Allergies  Allergen Reactions   Neomycin-Polymyxin-Hc Itching   Piroxicam Rash and Other (See Comments)    Blistering rash  REACTION: Blisters, REACTION: Blisters   Rosuvastatin Calcium Other (See Comments)    Myalgias   Statins Other (See Comments)    Myalgias   Diazepam Other (See Comments)    Dizziness  REACTION:  unknown, REACTION:  unknown   Doxycycline Nausea Only, Itching and Other (See Comments)    REACTION:  unknown, REACTION:  unknown   Gatifloxacin Anxiety and Swelling   Latex Rash    Medications Reviewed Today     Reviewed by Erroll Luna, [facility] (Pharmacist) on 08/05/22 at 0908  Med List Status: <None>   Medication Order Taking? Sig Documenting Provider Last Dose Status Informant  acetaminophen (TYLENOL) 500 MG tablet [id number] No Take 500 mg by mouth 2 (two) times daily as needed for mild pain, moderate pain, fever or headache. [provider] Taking Active Pharmacy Records, Child  acetaminophen (TYLENOL) 500 MG tablet [id number] No Take 500 mg by mouth 2 (two) times daily. [provider] Taking Active            Med Note (   Fri Jun 03, 2022 11:50 AM) Med Classification: Analgesic, Anti-inflammatory or Antipyretic  albuterol (PROVENTIL) (2.5 MG/3ML) 0.083% nebulizer solution [id number] No Take 3 mLs (2.5 mg total) by nebulization every 4 (four) hours as needed for wheezing or shortness of breath. [name], York Ram, MD Taking Active   apixaban [facility]) 2.5 MG TABS tablet [id number]  Take 1 tablet (2.5 mg total) by mouth 2 (two) times daily. Donita Brooks, MD  Active   budesonide-formoterol [facility]) 160-4.5 MCG/ACT inhaler [id number] No Inhale 2 puffs into the lungs in the morning and at bedtime. [name], York Ram, MD Taking Active   cephALEXin [facility]) 500 MG capsule [id number] No Take 1  capsule (500 mg total) by mouth 3 (three) times daily. Donita Brooks, MD Taking Active   Cholecalciferol (VITAMIN D-3 PO)  [id number] No Take 1 capsule by mouth in the morning. [provider] Taking Active Pharmacy Records, Child  ferrous sulfate (FEROSUL) 325 (65 FE) MG tablet [id number]  Take 1 tablet (325 mg total) by mouth every other day. Donita Brooks, MD  Active   finasteride (PROSCAR) 5 MG tablet [id number] No Take 1 tablet (5 mg total) by mouth daily. Donita Brooks, MD Taking Active Pharmacy Records, Child  FLUoxetine [facility]) 20 MG capsule [id number] No TAKE 1 CAPSULE EVERY DAY Donita Brooks, MD Taking Active   folic acid (FOLVITE) 1 MG tablet [id number]  Take 1 tablet (1 mg total) by mouth daily. Donita Brooks, MD  Active   furosemide (LASIX) 40 MG tablet [id number] No Take 1 tablet (40 mg total) by mouth daily. [name], York Ram, MD Taking Expired 06/16/22 2359   guaiFENesin (ROBITUSSIN) 100 MG/5ML liquid [id number] No Take 10 mLs by mouth every 4 (four) hours as needed for cough or to loosen phlegm. Donita Brooks, MD Taking Active   Lifitegrast [facility]) 5 % SOLN [id number] No Place 1 drop into both eyes 2 (two) times daily.  Patient taking differently: Place 1 drop into both eyes as needed (dry eyes).    Taking Active Pharmacy Records, Child  pantoprazole (PROTONIX) 40 MG tablet [id number]  TAKE 1 TABLET EVERY MORNING Pickard, Priscille Heidelberg, MD  Active   polyethylene glycol (MIRALAX / GLYCOLAX) 17 g packet [id number] No Take 17 g by mouth daily as needed for mild constipation. Charlton Amor, PA-C Taking Active Pharmacy Records, Child  rosuvastatin (CRESTOR) 5 MG tablet [id number] No TAKE 1 TABLET EVERY DAY  Patient taking differently: Take 5 mg by mouth at bedtime.   Corky Crafts, MD Taking Active Child, Pharmacy Records  senna-docusate (STIMULANT LAXATIVE) 8.6-50 MG tablet [id number]  Take 2 tablets by mouth 2 (two) times daily as needed for  constipation   Active   tacrolimus (PROTOPIC) 0.1 % ointment [id number] No Apply topically 2 (two) times daily. [provider] Taking Active   tamsulosin (FLOMAX) 0.4 MG CAPS capsule [id number] No TAKE 1 CAPSULE EVERY DAY  Patient taking differently: Take 0.4 mg by mouth daily with supper.   Donita Brooks, MD Taking Active Pharmacy Records, Child  Zinc Oxide (TRIPLE PASTE) 12.8 % ointment [id number] No Apply topically 4 (four) times daily as needed (skin barrier).  Patient taking differently: Apply 1 Application topically 2 (two) times daily as needed (skin barrier).   Zigmund Daniel., MD Taking Active Child, Pharmacy Records            SDOH:  (Social Determinants of Health) assessments and interventions performed: No Financial Resource Strain: Low Risk  (07/25/2022)   Overall Financial Resource Strain (CARDIA)    Difficulty of Paying Living Expenses: Not hard at all    SDOH Interventions    Flowsheet Row Care Coordination from 07/25/2022 in [facility] Care Coordination from 06/30/2022 in [facility] Coordination Care Coordination from 06/03/2022 in [facility] Office Visit from 05/25/2022 in [facility] Telephone from 05/04/2022 in [facility] Care Coordination Clinical Support from 05/07/2021 in [facility]  SDOH Interventions        Food Insecurity Interventions Intervention Not Indicated Intervention Not Indicated  [Verified by Daughter, [name]] Intervention Not Indicated -- -- Intervention Not Indicated  Housing Interventions Intervention Not Indicated Intervention Not Indicated  [Verified  by Daughter, [name]] Intervention Not Indicated -- Intervention Not Indicated Intervention Not Indicated  Transportation Interventions Intervention Not Indicated Intervention Not Indicated,  Patient Resources (Friends/Family), Payor Benefit  [Verified by Daughter, [name]] Intervention Not Indicated -- Intervention Not Indicated Intervention Not Indicated  Utilities Interventions Intervention Not Indicated Intervention Not Indicated  [Verified by Daughter, [name]] Intervention Not Indicated -- -- --  Alcohol Usage Interventions -- Intervention Not Indicated (Score <7)  [Verified by Daughter, [name]] -- -- -- --  Depression Interventions/Treatment  -- -- -- PHQ2-9 Score <4 Follow-up Not Indicated -- --  Financial Strain Interventions Intervention Not Indicated Intervention Not Indicated  [Verified by Daughter, [name]] Intervention Not Indicated -- Intervention Not Indicated Intervention Not Indicated  Physical Activity Interventions -- Intervention Not Indicated, Patient Refused  [Verified by Daughter, [name]] -- -- -- Other (Comments)  [Pt does chair exercises and stretching.]  Stress Interventions Intervention Not Indicated Intervention Not Indicated  [Verified by Daughter, [name]] Intervention Not Indicated -- -- Intervention Not Indicated  Social Connections Interventions -- Intervention Not Indicated  [Verified by Daughter, [name]] -- -- -- Other (Comment)  [Pt sees family daily.]       Medication Assistance: None required.  Patient affirms current coverage meets needs.  Medication Access: Within the past 30 days, how often has patient missed a dose of medication? 0 Is a pillbox or other method used to improve adherence? Yes  Factors that may affect medication adherence? no barriers identified Are meds synced by current pharmacy? No  Are meds delivered by current pharmacy? No  Does patient experience delays in picking up medications due to transportation concerns? No   [facility] Reviewed: Is patient disadvantaged to use [facility]?: Yes  Current Rx insurance plan: [employer] Name and location of Current  pharmacy:  [facility] - [place], [place] - [address] [place] [address] Phone: [phone] Fax: [phone]  [facility] services reviewed with patient today?: Yes  Patient requests to transfer care to [facility]?: No  Reason patient declined to change pharmacies: Loyalty to other pharmacy/Patient preference  Compliance/Adherence/Medication fill history: [facility] Drugs:  Rosuvastatin 5 mg last filled 07/21/2022 90 DS   Care Gaps: Annual wellness visit in last year? No   Assessment/Plan     Hypertension (BP goal <130/80) -Controlled, not assessed -Current treatment: Metoprolol 25mg  one-half tablet once daily -Medications previously tried: atenolol  -Current home readings: 110/58 40-70s -Current exercise habits: minimal -Denies hypotensive/hypertensive symptoms -Educated on BP goals and benefits of medications for prevention of heart attack, stroke and kidney damage; Daily salt intake goal < 2300 mg; Importance of home blood pressure monitoring; Symptoms of hypotension and importance of maintaining adequate hydration; -Counseled to monitor BP at home weekly, document, and provide log at future appointments Home BP reported as controlled. -Recommended to continue current medication Continue to monitor at home  Hyperlipidemia: (LDL goal < 70 -Controlled, not assessed -Current treatment: Rosuvastatin 5mg  daily - PM -Medications previously tried: none noted  -Current exercise habits: minimal -Educated on Cholesterol goals;  Benefits of statin for ASCVD risk reduction; Importance of limiting foods high in cholesterol; -Most recent LDL is normal -Recommended to continue current medication  Atrial Fibrillation (Goal: prevent stroke and major bleeding)  -Controlled, not assessed today -Current treatment: Rate control: Metoprolol tartrate 25mg  one half tablet once daily  Appropriate, Effective, Safe,  Accessible Anticoagulation: Eliquis 2.5mg   BID  Appropriate, Effective, Safe, Accessible -Medications previously tried: none noted -Home BP and HR readings: "controlled'  -Counseled on increased risk of stroke due to Afib and benefits of anticoagulation for stroke prevention; bleeding risk associated with Xarelto and importance of self-monitoring for signs/symptoms of bleeding; -Had acute PE while on Xarelto.  Switched to Eliquis after hospital stay.  He is tolerating this well.  Copay is affordable and they are aware to take twice daily.  He is borderline for being able to take the 5mg  dose - will defer to cardiology on the dosing due to age.  Update 09/03/21 Does mention Xarelto is costing them over $100 per month.  Possible they may could qualify for the [place] program where the copay would be max $85 per month. Will assess finances and try to help them get help with copay. NO other concerns at this time, no abnormal bleeding. Continue current meds.  Heart Failure (Goal: manage symptoms and prevent exacerbations) 08/05/22 -Controlled -Last ejection fraction: 60-65%  -HF type: Diastolic -Current treatment: Furosemide 40mg  daily Appropriate, Effective, Safe, Accessible -Medications previously tried: metoprolol -Denies any recent swelling that does not resolve -Educated on Benefits of medications for managing symptoms and prolonging life Importance of weighing daily; if you gain more than 3 pounds in one day or 5 pounds in one week, contact providers -Denies any recent swelling or shortness of breath.  Discussed importance of calling providers with any noticeable swelling or weight gain.  For now continue medications as current.   Update 09/02/21 Continuing to monitor weights, swelling. Denies any swelling at this time. She was giving him 40mg  of Lasix but had to reduce to 20mg  due to some dehydration. No other concerns at this time, continue to monitor fluid retention, limit salt  intake. No med changes.  COPD (Goal: control symptoms and prevent exacerbations) -Not ideally controlled -Current treatment  None currently -Medications previously tried: Ventolin, Advair  -Pulmonary function testing: Pulmonary Functions Testing Results:  TLC  Date Value Ref Range Status  01/05/2016 5.47 L Final  -Exacerbations requiring treatment in last 6 months: no -Patient denies consistent use of maintenance inhaler -Frequency of rescue inhaler use: none -Counseled on Benefits of consistent maintenance inhaler use Differences between maintenance and rescue inhalers Patient may benefit from maintenance inhaler if SOB does not improve. -Continue current management for now.  Depression/Anxiety (Goal: Reduce Symptoms) -Controlled -Current treatment: Fluoxetine 20mg  daily Alprazolam 0.5mg  one-half tid prn - not taking at this time -Medications previously tried/failed: Abilify, lorazepam, Effexor -PHQ9:  PHQ9 SCORE ONLY 07/06/2020 07/04/2019 08/20/2018  PHQ-9 Total Score 0 2 8  -Educated on Benefits of medication for symptom control -Does sometimes have difficulty sleeping, does not like Xanax because it makes him drowsy the whole next day -Recommended to continue current medication Consider something like trazodone if sleep continues to be an issue.  Patient Goals/Self-Care Activities Patient will:  - take medications as prescribed as evidenced by patient report and record review focus on medication adherence by pill box check blood pressure weekly, document, and provide at future appointments  Follow Up Plan: The care management team will reach out to the patient again over the next 180 days.          Willa Frater, PharmD, CPP Clinical Pharmacist Practitioner [facility] [phone]

## 2022-08-09 ENCOUNTER — Encounter: Payer: Self-pay | Admitting: Podiatry

## 2022-08-09 ENCOUNTER — Ambulatory Visit: Payer: Medicare HMO | Admitting: Podiatry

## 2022-08-09 DIAGNOSIS — I739 Peripheral vascular disease, unspecified: Secondary | ICD-10-CM

## 2022-08-09 DIAGNOSIS — B351 Tinea unguium: Secondary | ICD-10-CM | POA: Diagnosis not present

## 2022-08-09 DIAGNOSIS — Z7901 Long term (current) use of anticoagulants: Secondary | ICD-10-CM | POA: Diagnosis not present

## 2022-08-09 DIAGNOSIS — M79675 Pain in left toe(s): Secondary | ICD-10-CM

## 2022-08-09 DIAGNOSIS — M79674 Pain in right toe(s): Secondary | ICD-10-CM

## 2022-08-09 NOTE — Progress Notes (Signed)
  Subjective:  Patient ID: Evan [name], male    DOB: 1927-04-24,   MRN: [id number]  Chief Complaint  Patient presents with   Nail Problem     Routine foot care    89 y.o. male presents for concern of thickened elongated and painful nails that are difficult to trim. Requesting to have them trimmed today. Relates burning and tingling in their feet. Patient with PVD and on blood thinner.   PCP:  Donita Brooks, MD    . Denies any other pedal complaints. Denies n/v/f/c.   Past Medical History:  Diagnosis Date   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   Anginal chest pain at rest    Chronic non, controlled on Lorazepam   Anxiety    Arthritis    "all over"    BPH (benign prostatic hyperplasia)    CAD (coronary artery disease)    Chronic lower back pain    Complication of anesthesia    "problems making water afterwards"   Depression    DJD (degenerative joint disease)    Esophageal ulcer without bleeding    bid ppi indefinitely   Family history of adverse reaction to anesthesia    "children w/PONV"   GERD (gastroesophageal reflux disease)    Headache    "couple/week maybe" (09/04/2015)   Hemochromatosis    Possible, elevated iron stores, hook-like osteophytes on hand films, normal LFTs   Hepatitis    "yellow jaundice as a baby"   History of ASCVD    MULTIVESSEL   Hyperlipidemia    Hypertension    Mild aortic stenosis 06/23/2021   Osteoarthritis    Pneumonia    "several times; got a little now" (09/04/2015)   Rheumatoid arthritis (HCC)    Subdural hematoma (HCC)    small after fall 09/2016-plavix held, neurosurgery consulted.  Asymptomatic.     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 he failed PO Eliquis    Objective:  Physical Exam: Vascular: DP/PT pulses 1/4 bilateral. CFT <3 seconds. Absent hair growth on digits. Edema noted to bilateral lower extremities. Xerosis noted bilaterally.  Skin. No lacerations or abrasions bilateral feet. Nails 1-5  bilateral  are thickened discolored and elongated with subungual debris.  Musculoskeletal: MMT 5/5 bilateral lower extremities in DF, PF, Inversion and Eversion. Deceased ROM in DF of ankle joint.  Neurological: Sensation intact to light touch. Protective sensation diminished bilateral.    Assessment:   1. Pain due to onychomycosis of toenails of both feet   2. UNSPECIFIED PERIPHERAL VASCULAR DISEASE   3. Chronic anticoagulation      Plan:  Patient was evaluated and treated and all questions answered. -Mechanically debrided all nails 1-5 bilateral using sterile nail nipper and filed with dremel without incident  -Answered all patient questions -Patient to return  in 3 months for at risk foot care -Patient advised to call the office if any problems or questions arise in the meantime.   Louann Sjogren, DPM

## 2022-08-11 ENCOUNTER — Ambulatory Visit: Payer: Self-pay | Admitting: *Deleted

## 2022-08-11 NOTE — Patient Outreach (Signed)
  Care Coordination   Follow Up Visit Note   08/11/2022 Name: Evan [name] MRN: [id number] DOB: 07/12/1927  Evan [name] is a 89 y.o. year old male who sees Pickard, Priscille Heidelberg, MD for primary care. I spoke with Evan [name], [name] by phone today.  What matters to the patients health and wellness today?  Foley concerns,  catheter care, dementia, confusion, use of foley leg strap to prevent pain, bleeding & tugging  Evan [name] voiced understanding of use of a foley/catheter strap/band She also voiced understanding of neurology services and referral needed to have Evan [name] further evaluated  Evan [name] is not aggressive nor wanders per [employer] call to neurology with her permission  Podiatry seen and his foot and toe pain is resolved per Evan [name] "He feels a whole lot better"   Goals Addressed             This Visit's Progress    [facility] care coordination services   On track    Interventions Today    Flowsheet Row Most Recent Value  Chronic Disease   Chronic disease during today's visit Other  [memory, podiatry, neurology, foley catheter pain]  General Interventions   General Interventions Discussed/Reviewed General Interventions Reviewed, Annual Foot Exam, Durable Medical Equipment (DME), Doctor Visits, [facility], Communication with  Horticulturist, commercial (DME) Other  [foley catheter strap/band to prevent pulling of foley, bleeding of penis and pain at foley insertion site]  PCP/Specialist Visits Compliance with follow-up visit  Communication with PCP/Specialists  [conference outreach to neurology office last seen at in 2021 for stroke, Spoke with maggie about future visit, need of referral from pcp]  Exercise Interventions   Exercise Discussed/Reviewed Exercise Reviewed, Physical Activity  [not as active related to foley]  Physical Activity Discussed/Reviewed Physical Activity Reviewed, Home Exercise Program (HEP)  Education Interventions   Education Provided  Provided Education, Provided Web-based Education  [catheter care, dementia, confusion, use of foley leg strap to prevent pain, bleeding & tugging]  Provided Verbal Education On Mental Health/Coping with Illness, When to see the doctor, Programmer, applications  Mental Health Interventions   Mental Health Discussed/Reviewed Mental Health Reviewed, Coping Strategies              SDOH assessments and interventions completed:  No     Care Coordination Interventions:  Yes, provided   Follow up plan: Follow up call scheduled for 09/12/22    Encounter Outcome:  Pt. Visit Completed    Azaylah Stailey L. Noelle Penner, RN, BSN, CCM [facility] Care Management Community Coordinator Office number [phone]

## 2022-08-11 NOTE — Patient Instructions (Addendum)
Visit Information  Thank you for taking time to visit with me today. Please don't hesitate to contact me if I can be of assistance to you.   Following are the goals we discussed today:   Goals Addressed             This Visit's Progress    [facility] care coordination services   On track    Interventions Today    Flowsheet Row Most Recent Value  Chronic Disease   Chronic disease during today's visit Other  [memory, podiatry, neurology, foley catheter pain]  General Interventions   General Interventions Discussed/Reviewed General Interventions Reviewed, Annual Foot Exam, Durable Medical Equipment (DME), Doctor Visits, [facility], Communication with  Horticulturist, commercial (DME) Other  [foley catheter strap/band to prevent pulling of foley, bleeding of penis and pain at foley insertion site]  PCP/Specialist Visits Compliance with follow-up visit  Communication with PCP/Specialists  [conference outreach to neurology office last seen at in 2021 for stroke, Spoke with maggie about future visit, need of referral from pcp]  Exercise Interventions   Exercise Discussed/Reviewed Exercise Reviewed, Physical Activity  [not as active related to foley]  Physical Activity Discussed/Reviewed Physical Activity Reviewed, Home Exercise Program (HEP)  Education Interventions   Education Provided Provided Education, Provided Web-based Education  [catheter care, dementia, confusion, use of foley leg strap to prevent pain, bleeding & tugging]  Provided Verbal Education On Mental Health/Coping with Illness, When to see the doctor, Community Resources  Mental Health Interventions   Mental Health Discussed/Reviewed Mental Health Reviewed, Coping Strategies              Our next appointment is by telephone on 09/12/22 at 2 pm  Please call the care guide team at [phone] if you need to cancel or reschedule your appointment.   If you are experiencing a Mental Health or Behavioral Health Crisis  or need someone to talk to, please call the Suicide and Crisis Lifeline: 988 call the [place] National Suicide Prevention Lifeline: 339 842 5153 or TTY: 909-352-0097 TTY (339)778-2036) to talk to a trained counselor call 1-800-273-TALK (toll free, 24 hour hotline) go to [facility] [address] [place] [phone]) call the [facility] Crisis Line: ([phone])[phone] call 911   Patient verbalizes understanding of instructions and care plan provided today and agrees to view in MyChart. Active MyChart status and patient understanding of how to access instructions and care plan via MyChart confirmed with patient.     The patient has been provided with contact information for the care management team and has been advised to call with any health related questions or concerns.    Landra Howze L. Noelle Penner, RN, BSN, CCM [facility] Care Management Community Coordinator Office number [phone]

## 2022-08-18 ENCOUNTER — Other Ambulatory Visit (HOSPITAL_COMMUNITY): Payer: Self-pay

## 2022-08-18 ENCOUNTER — Other Ambulatory Visit: Payer: Self-pay | Admitting: Family Medicine

## 2022-08-18 MED ORDER — FOLIC ACID 1 MG PO TABS
1.0000 mg | ORAL_TABLET | Freq: Every day | ORAL | 2 refills | Status: DC
  Filled 2022-08-18 – 2022-08-31 (×2): qty 30, 30d supply, fill #0
  Filled 2023-01-08: qty 30, 30d supply, fill #1
  Filled 2023-02-11: qty 30, 30d supply, fill #2

## 2022-08-18 NOTE — Telephone Encounter (Signed)
Requested Prescriptions  Pending Prescriptions Disp Refills   folic acid (FOLVITE) 1 MG tablet 30 tablet 2    Sig: Take 1 tablet (1 mg total) by mouth daily.     Endocrinology:  Vitamins Failed - 08/18/2022  4:13 PM      Failed - Valid encounter within last 12 months    Recent Outpatient Visits           1 year ago Acute pain of right shoulder   [facility] Pickard, Priscille Heidelberg, MD   1 year ago Dysuria   [facility] Donita Brooks, MD   1 year ago Acute systolic congestive heart failure [facility])   [facility] Donita Brooks, MD   1 year ago Acute cough   [facility] Valentino Nose, NP   1 year ago Need for immunization against influenza   [facility] Pickard, Priscille Heidelberg, MD

## 2022-08-19 ENCOUNTER — Other Ambulatory Visit: Payer: Self-pay

## 2022-08-21 ENCOUNTER — Other Ambulatory Visit: Payer: Self-pay | Admitting: Interventional Cardiology

## 2022-08-22 ENCOUNTER — Encounter: Payer: Medicare HMO | Admitting: *Deleted

## 2022-08-28 ENCOUNTER — Other Ambulatory Visit: Payer: Self-pay | Admitting: Family Medicine

## 2022-08-28 DIAGNOSIS — R5382 Chronic fatigue, unspecified: Secondary | ICD-10-CM

## 2022-08-28 DIAGNOSIS — R531 Weakness: Secondary | ICD-10-CM

## 2022-08-30 NOTE — Telephone Encounter (Signed)
Requested Prescriptions  Pending Prescriptions Disp Refills   FEROSUL 325 (65 Fe) MG tablet [Pharmacy Med Name: FEROSUL 325 (65 Fe) MG Tablet] 90 tablet 3    Sig: TAKE 1 TABLET EVERY OTHER DAY     Endocrinology:  Minerals - Iron Supplementation Failed - 08/28/2022  7:10 AM      Failed - HGB in normal range and within 360 days    Hemoglobin  Date Value Ref Range Status  06/28/2022 10.8 (L) 13.2 - 17.1 g/dL Final  16/01/9603 54.0 (L) 13.0 - 17.7 g/dL Final   HGB  Date Value Ref Range Status  08/16/2016 13.4 13.0 - 17.1 g/dL Final         Failed - HCT in normal range and within 360 days    HCT  Date Value Ref Range Status  06/28/2022 32.8 (L) 38.5 - 50.0 % Final  08/16/2016 39.8 38.4 - 49.9 % Final   Hematocrit  Date Value Ref Range Status  11/01/2021 31.0 (L) 37.5 - 51.0 % Final         Failed - RBC in normal range and within 360 days    RBC  Date Value Ref Range Status  06/28/2022 3.53 (L) 4.20 - 5.80 Million/uL Final         Failed - Fe (serum) in normal range and within 360 days    Iron  Date Value Ref Range Status  05/02/2022 31 (L) 45 - 182 ug/dL Final   Saturation Ratios  Date Value Ref Range Status  05/02/2022 8 (L) 17.9 - 39.5 % Final         Failed - Valid encounter within last 12 months    Recent Outpatient Visits           1 year ago Acute pain of right shoulder   [facility] Donita Brooks, MD   1 year ago Dysuria   [facility] Donita Brooks, MD   1 year ago Acute systolic congestive heart failure (HCC)   [facility] Donita Brooks, MD   1 year ago Acute cough   [facility] Valentino Nose, NP   1 year ago Need for immunization against influenza   [facility] Pickard, Priscille Heidelberg, MD              Passed - Ferritin in normal range and within 360 days    Ferritin  Date Value Ref Range Status  05/02/2022 25 24 - 336 ng/mL Final    Comment:     Performed at [facility], [address], [place], [place] [address]

## 2022-08-31 ENCOUNTER — Other Ambulatory Visit: Payer: Self-pay

## 2022-08-31 ENCOUNTER — Other Ambulatory Visit (HOSPITAL_COMMUNITY): Payer: Self-pay

## 2022-08-31 ENCOUNTER — Other Ambulatory Visit: Payer: Self-pay | Admitting: Family Medicine

## 2022-08-31 MED ORDER — APIXABAN 2.5 MG PO TABS
2.5000 mg | ORAL_TABLET | Freq: Two times a day (BID) | ORAL | 1 refills | Status: DC
  Filled 2022-08-31: qty 60, 30d supply, fill #0
  Filled 2022-10-01: qty 60, 30d supply, fill #1

## 2022-09-01 ENCOUNTER — Other Ambulatory Visit (HOSPITAL_COMMUNITY): Payer: Self-pay

## 2022-09-02 ENCOUNTER — Telehealth: Payer: Self-pay

## 2022-09-02 ENCOUNTER — Other Ambulatory Visit: Payer: Self-pay | Admitting: Family Medicine

## 2022-09-02 MED ORDER — CEPHALEXIN 500 MG PO CAPS: 500.0000 mg | ORAL_CAPSULE | Freq: Three times a day (TID) | ORAL | 0 refills | Status: DC

## 2022-09-02 NOTE — Progress Notes (Signed)
Evan [name]

## 2022-09-02 NOTE — Telephone Encounter (Signed)
Resheda RN with [facility] called in to make pcp/nurse aware that pt's scrotum is very red, swollen, and tender to touch. Pt has a low grade temp of 99. Pt has been give some Tylenol and applied a cool compress to the area. RN wanted to know if pcp would like to send in an antibiotic. Please advise.  Cb#: [phone]

## 2022-09-05 ENCOUNTER — Other Ambulatory Visit (HOSPITAL_COMMUNITY): Payer: Self-pay

## 2022-09-05 DIAGNOSIS — N452 Orchitis: Secondary | ICD-10-CM | POA: Diagnosis not present

## 2022-09-05 DIAGNOSIS — R8279 Other abnormal findings on microbiological examination of urine: Secondary | ICD-10-CM | POA: Diagnosis not present

## 2022-09-05 MED ORDER — SULFAMETHOXAZOLE-TRIMETHOPRIM 800-160 MG PO TABS
1.0000 | ORAL_TABLET | Freq: Two times a day (BID) | ORAL | 0 refills | Status: DC
  Filled 2022-09-05: qty 28, 14d supply, fill #0

## 2022-09-05 MED ORDER — CLOTRIMAZOLE-BETAMETHASONE 1-0.05 % EX CREA
TOPICAL_CREAM | Freq: Two times a day (BID) | CUTANEOUS | 1 refills | Status: DC
  Filled 2022-09-05: qty 45, 30d supply, fill #0

## 2022-09-06 ENCOUNTER — Ambulatory Visit: Payer: Medicare HMO | Admitting: Family Medicine

## 2022-09-09 ENCOUNTER — Other Ambulatory Visit (HOSPITAL_COMMUNITY): Payer: Self-pay

## 2022-09-09 MED ORDER — FOSFOMYCIN TROMETHAMINE 3 G PO PACK
PACK | ORAL | 1 refills | Status: DC
  Filled 2022-09-09: qty 1, 30d supply, fill #0
  Filled 2022-09-09: qty 1, 3d supply, fill #0

## 2022-09-12 ENCOUNTER — Telehealth: Payer: Self-pay

## 2022-09-12 ENCOUNTER — Ambulatory Visit: Payer: Self-pay | Admitting: *Deleted

## 2022-09-12 NOTE — Patient Outreach (Signed)
  Care Coordination   Follow Up Visit Note   09/12/2022 Name: KOHLE WINNER MRN: [id number] DOB: 1928-01-22  SABRE LEONETTI is a 89 y.o. year old male who sees Pickard, Priscille Heidelberg, MD for primary care. I spoke with  [name] by phone today.  What matters to the patients health and wellness today?  Need prescription for bandage he is not allergic to sacral foam, xeroform On antibiotic and tolerating it well for a UTI Now connected with podiatrist & neurology  Doing well otherwise    Goals Addressed             This Visit's Progress    [facility] care coordination services   Not on track    Interventions Today    Flowsheet Row Most Recent Value  Chronic Disease   Chronic disease during today's visit Other  [UTI management, skin care/tape/supplies]  General Interventions   General Interventions Discussed/Reviewed Communication with, Doctor Visits, General Interventions Reviewed  Doctor Visits Discussed/Reviewed PCP, Specialist  Durable Medical Equipment (DME) Other  Communication with PCP/Specialists  Mental Health Interventions   Mental Health Discussed/Reviewed Mental Health Reviewed, Coping Strategies  Pharmacy Interventions   Pharmacy Dicussed/Reviewed Pharmacy Topics Reviewed, Affording Medications              SDOH assessments and interventions completed:  No     Care Coordination Interventions:  Yes, provided   Follow up plan: Follow up call scheduled for 09/12/23    Encounter Outcome:  Pt. Visit Completed   Dulcemaria Bula L. Noelle Penner, RN, BSN, CCM [facility] Health RN Care Manager [phone]

## 2022-09-12 NOTE — Telephone Encounter (Signed)
Clinton Gallant, RN  [facility] Clinical Pool; Donita Brooks, MD  Daughter, [name] reports patient needs another prescription for skin care tape that he is not allergic to - sacral foam, xeroform.  Kimberly L. Noelle Penner, RN, BSN, CCM [facility] Care Management Community Coordinator Office number [phone]

## 2022-09-13 ENCOUNTER — Other Ambulatory Visit: Payer: Self-pay | Admitting: Family Medicine

## 2022-09-13 ENCOUNTER — Other Ambulatory Visit (HOSPITAL_COMMUNITY): Payer: Self-pay

## 2022-09-13 MED ORDER — "XEROFORM PETROLATUM DRES 4""X4"" 3 % EX PADS"
1.0000 "application " | MEDICATED_PAD | Freq: Every day | CUTANEOUS | 3 refills | Status: DC
  Filled 2022-09-13: qty 25, 25d supply, fill #0

## 2022-09-13 MED ORDER — "SILIGENTLE FOAM DRESSNG/SACRAL 7""X7"" PADS"
1.0000 | MEDICATED_PAD | Freq: Every day | 11 refills | Status: DC
  Filled 2022-09-13: qty 5, fill #0

## 2022-09-20 ENCOUNTER — Other Ambulatory Visit (HOSPITAL_COMMUNITY): Payer: Self-pay

## 2022-09-20 MED ORDER — SODIUM FLUORIDE 1.1 % DT CREA
TOPICAL_CREAM | DENTAL | 1 refills | Status: DC
  Filled 2022-09-20: qty 51, 30d supply, fill #0
  Filled 2022-11-09: qty 51, 30d supply, fill #1

## 2022-09-22 ENCOUNTER — Encounter: Payer: Self-pay | Admitting: Family Medicine

## 2022-09-22 ENCOUNTER — Ambulatory Visit (INDEPENDENT_AMBULATORY_CARE_PROVIDER_SITE_OTHER): Payer: Medicare HMO | Admitting: Family Medicine

## 2022-09-22 VITALS — BP 120/68 | HR 88 | Ht 63.0 in | Wt 148.0 lb

## 2022-09-22 DIAGNOSIS — T169XXA Foreign body in ear, unspecified ear, initial encounter: Secondary | ICD-10-CM

## 2022-09-22 MED ORDER — "XEROFORM PETROLATUM DRES 4""X4"" 3 % EX PADS": 1.0000 "application " | MEDICATED_PAD | Freq: Every day | CUTANEOUS | 3 refills | Status: DC

## 2022-09-22 MED ORDER — "SILIGENTLE FOAM DRESSNG/SACRAL 7""X7"" PADS": 1.0000 | MEDICATED_PAD | Freq: Every day | 11 refills | Status: DC

## 2022-09-22 NOTE — Progress Notes (Signed)
Subjective:   Patient's family is concerned that he may have lost the tip to his hearing aid down in his ear canal.  The hearing aid is missing the blue rubber tip.  Examination of both auditory canals reveals a cerumen impaction.  I removed this with a pair of alligator forceps.  On both sides, I visualize the tympanic membrane.  There was no foreign body in the right auditory canal.  The left tympanic membrane does reveal an old perforation but no foreign body Past Medical History:  Diagnosis Date   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   Anginal chest pain at rest    Chronic non, controlled on Lorazepam   Anxiety    Arthritis    "all over"    BPH (benign prostatic hyperplasia)    CAD (coronary artery disease)    Chronic lower back pain    Complication of anesthesia    "problems making water afterwards"   Depression    DJD (degenerative joint disease)    Esophageal ulcer without bleeding    bid ppi indefinitely   Family history of adverse reaction to anesthesia    "children w/PONV"   GERD (gastroesophageal reflux disease)    Headache    "couple/week maybe" (09/04/2015)   Hemochromatosis    Possible, elevated iron stores, hook-like osteophytes on hand films, normal LFTs   Hepatitis    "yellow jaundice as a baby"   History of ASCVD    MULTIVESSEL   Hyperlipidemia    Hypertension    Mild aortic stenosis 06/23/2021   Osteoarthritis    Pneumonia    "several times; got a little now" (09/04/2015)   Rheumatoid arthritis (HCC)    Subdural hematoma (HCC)    small after fall 09/2016-plavix held, neurosurgery consulted.  Asymptomatic.     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 he failed PO Eliquis   Past Surgical History:  Procedure Laterality Date   ARM SKIN LESION BIOPSY / EXCISION Left 09/02/2015   CATARACT EXTRACTION W/ INTRAOCULAR LENS  IMPLANT, BILATERAL Bilateral    CHOLECYSTECTOMY OPEN     CORNEAL TRANSPLANT Bilateral    "one at [facility]; one at [facility]"    CORONARY ANGIOPLASTY WITH STENT PLACEMENT     CORONARY ARTERY BYPASS GRAFT  1999   DESCENDING AORTIC ANEURYSM REPAIR W/ STENT     DE stent ostium into the right radial free graft at OM-, 02-2007   ESOPHAGOGASTRODUODENOSCOPY N/A 11/09/2014   Procedure: ESOPHAGOGASTRODUODENOSCOPY (EGD);  Surgeon: Dorena Cookey, MD;  Location: [facility] ENDOSCOPY;  Service: Endoscopy;  Laterality: N/A;   INGUINAL HERNIA REPAIR     JOINT REPLACEMENT     LEFT HEART CATH AND CORS/GRAFTS ANGIOGRAPHY N/A 06/27/2017   Procedure: LEFT HEART CATH AND CORS/GRAFTS ANGIOGRAPHY;  Surgeon: Lennette Bihari, MD;  Location: [facility] INVASIVE CV LAB;  Service: Cardiovascular;  Laterality: N/A;   NASAL SINUS SURGERY     SHOULDER OPEN ROTATOR CUFF REPAIR Right    TOTAL KNEE ARTHROPLASTY Bilateral    Current Outpatient Medications on File Prior to Visit  Medication Sig Dispense Refill   acetaminophen (TYLENOL) 500 MG tablet Take 500 mg by mouth 2 (two) times daily as needed for mild pain, moderate pain, fever or headache.     acetaminophen (TYLENOL) 500 MG tablet Take 500 mg by mouth 2 (two) times daily.     albuterol (PROVENTIL) (2.5 MG/3ML) 0.083% nebulizer solution Take 3 mLs (2.5 mg total) by nebulization every 4 (four) hours as needed for wheezing  or shortness of breath. 75 mL 12   apixaban (ELIQUIS) 2.5 MG TABS tablet Take 1 tablet (2.5 mg total) by mouth 2 (two) times daily. 60 tablet 1   Bismuth Tribromoph-Petrolatum (XEROFORM PETROLATUM DRES 4"X4") 3 % PADS Apply 1 application  topically daily. 25 each 3   budesonide-formoterol (SYMBICORT) 160-4.5 MCG/ACT inhaler Inhale 2 puffs into the lungs in the morning and at bedtime. 10.2 g 12   cephALEXin (KEFLEX) 500 MG capsule Take 1 capsule (500 mg total) by mouth 3 (three) times daily. 21 capsule 0   Cholecalciferol (VITAMIN D-3 PO) Take 1 capsule by mouth in the morning.     clotrimazole-betamethasone (LOTRISONE) cream Apply a small amount topically to cleansed affected area 2 (two) times  daily for 7 to 10 days 45 g 1   ferrous sulfate (FEROSUL) 325 (65 FE) MG tablet TAKE 1 TABLET EVERY OTHER DAY 90 tablet 3   finasteride (PROSCAR) 5 MG tablet Take 1 tablet (5 mg total) by mouth daily. 90 tablet 3   FLUoxetine (PROZAC) 20 MG capsule TAKE 1 CAPSULE EVERY DAY 90 capsule 3   folic acid (FOLVITE) 1 MG tablet Take 1 tablet (1 mg total) by mouth daily. 30 tablet 2   fosfomycin (MONUROL) 3 g PACK Mix & take 1 packet by mouth , and another packet, 3 days later 1 each 1   furosemide (LASIX) 40 MG tablet Take 1 tablet (40 mg total) by mouth daily. 30 tablet 0   Gauze Pads & Dressings (SILIGENTLE FOAM DRESSNG/SACRAL) 7"X7" PADS Apply daily at 6 (six) AM. 5 each 11   guaiFENesin (ROBITUSSIN) 100 MG/5ML liquid Take 10 mLs by mouth every 4 (four) hours as needed for cough or to loosen phlegm. 118 mL 0   Lifitegrast (XIIDRA) 5 % SOLN Place 1 drop into both eyes 2 (two) times daily. (Patient taking differently: Place 1 drop into both eyes as needed (dry eyes).) 60 each 12   pantoprazole (PROTONIX) 40 MG tablet TAKE 1 TABLET EVERY MORNING 90 tablet 2   polyethylene glycol (MIRALAX / GLYCOLAX) 17 g packet Take 17 g by mouth daily as needed for mild constipation. 14 each 0   rosuvastatin (CRESTOR) 5 MG tablet TAKE 1 TABLET EVERY DAY (Patient taking differently: Take 5 mg by mouth at bedtime.) 90 tablet 1   senna-docusate (STIMULANT LAXATIVE) 8.6-50 MG tablet Take 2 tablets by mouth 2 (two) times daily as needed for constipation 60 tablet 2   sodium fluoride (SF 5000 PLUS) 1.1 % CREA dental cream Brush on teeth for 3-5 minutes then spit once per day 51 g 1   sulfamethoxazole-trimethoprim (BACTRIM DS) 800-160 MG tablet Take 1 tablet by mouth 2 (two) times daily with food 28 tablet 0   tacrolimus (PROTOPIC) 0.1 % ointment Apply topically 2 (two) times daily.     tamsulosin (FLOMAX) 0.4 MG CAPS capsule TAKE 1 CAPSULE EVERY DAY (Patient taking differently: Take 0.4 mg by mouth daily with supper.) 90 capsule  10   Zinc Oxide (TRIPLE PASTE) 12.8 % ointment Apply topically 4 (four) times daily as needed (skin barrier). (Patient taking differently: Apply 1 Application topically 2 (two) times daily as needed (skin barrier).) 56.7 g 0   No current facility-administered medications on file prior to visit.   Allergies  Allergen Reactions   Neomycin-Polymyxin-Hc Itching   Piroxicam Rash and Other (See Comments)    Blistering rash  REACTION: Blisters, REACTION: Blisters   Rosuvastatin Calcium Other (See Comments)    Myalgias  Statins Other (See Comments)    Myalgias   Diazepam Other (See Comments)    Dizziness  REACTION:  unknown, REACTION:  unknown   Doxycycline Nausea Only, Itching and Other (See Comments)    REACTION:  unknown, REACTION:  unknown   Gatifloxacin Anxiety and Swelling   Latex Rash   Social History   Socioeconomic History   Marital status: Widowed    Spouse name: Not on file   Number of children: 2   Years of education: 29   Highest education level: 12th grade  Occupational History   Not on file  Tobacco Use   Smoking status: Former    Types: Cigarettes    Passive exposure: Yes   Smokeless tobacco: Former    Types: Chew   Tobacco comments:    "quit chewing in the 1960s"    Verified by Daughter, [name]  Vaping Use   Vaping Use: Never used  Substance and Sexual Activity   Alcohol use: No   Drug use: No   Sexual activity: Never  Other Topics Concern   Not on file  Social History Narrative   Not on file   Social Determinants of Health   Financial Resource Strain: Low Risk  (07/25/2022)   Overall Financial Resource Strain (CARDIA)    Difficulty of Paying Living Expenses: Not hard at all  Food Insecurity: No Food Insecurity (07/25/2022)   Hunger Vital Sign    Worried About Running Out of Food in the Last Year: Never true    Ran Out of Food in the Last Year: Never true  Transportation Needs: No Transportation Needs (07/25/2022)   PRAPARE -  Administrator, Civil Service (Medical): No    Lack of Transportation (Non-Medical): No  Physical Activity: Inactive (06/30/2022)   Exercise Vital Sign    Days of Exercise per Week: 0 days    Minutes of Exercise per Session: 0 min  Stress: No Stress Concern Present (07/25/2022)   [employer] of Occupational Health - Occupational Stress Questionnaire    Feeling of Stress : Only a little  Social Connections: Moderately Isolated (06/30/2022)   Social Connection and Isolation Panel [NHANES]    Frequency of Communication with Friends and Family: More than three times a week    Frequency of Social Gatherings with Friends and Family: More than three times a week    Attends Religious Services: 1 to 4 times per year    Active Member of [employer] or Organizations: No    Attends Banker Meetings: Never    Marital Status: Widowed  Intimate Partner Violence: Not At Risk (07/25/2022)   Humiliation, Afraid, Rape, and Kick questionnaire    Fear of Current or Ex-Partner: No    Emotionally Abused: No    Physically Abused: No    Sexually Abused: No      Review of Systems     Objective:   Physical Exam Vitals reviewed.  Constitutional:      General: He is not in acute distress.    Appearance: Normal appearance. He is not toxic-appearing or diaphoretic.  HENT:     Right Ear: Decreased hearing noted. No foreign body. Tympanic membrane is not injected, scarred or perforated.     Left Ear: Decreased hearing noted. No foreign body. Tympanic membrane is perforated. Tympanic membrane is not injected or scarred.  Cardiovascular:     Rate and Rhythm: Normal rate and regular rhythm.     Heart sounds: Normal heart sounds.  No murmur heard.    No friction rub. No gallop.  Pulmonary:     Effort: Pulmonary effort is normal. No respiratory distress.     Breath sounds: Rales present. No wheezing or rhonchi.    Musculoskeletal:     Right lower leg: No edema.     Left lower leg: No  edema.  Neurological:     Mental Status: He is alert.   Right-sided hemiparesis with weakness in his right leg.  Weakness with knee flexion and extension and ankle flexion and extension.  Ataxia in the right upper arm and right lower leg.         Assessment & Plan:  Foreign body in ear, unspecified laterality, initial encounter I do not appreciate any foreign body in either ear.  I did remove the cerumen impaction bilaterally.

## 2022-09-27 ENCOUNTER — Emergency Department (HOSPITAL_COMMUNITY)

## 2022-09-27 ENCOUNTER — Other Ambulatory Visit: Payer: Self-pay

## 2022-09-27 ENCOUNTER — Observation Stay (HOSPITAL_COMMUNITY)
Admission: EM | Admit: 2022-09-27 | Discharge: 2022-09-29 | Disposition: A | Discharge status: Home / Self Care | Attending: Internal Medicine | Admitting: Internal Medicine

## 2022-09-27 DIAGNOSIS — R739 Hyperglycemia, unspecified: Secondary | ICD-10-CM | POA: Diagnosis not present

## 2022-09-27 DIAGNOSIS — R079 Chest pain, unspecified: Secondary | ICD-10-CM | POA: Diagnosis not present

## 2022-09-27 DIAGNOSIS — Z955 Presence of coronary angioplasty implant and graft: Secondary | ICD-10-CM | POA: Insufficient documentation

## 2022-09-27 DIAGNOSIS — Z86711 Personal history of pulmonary embolism: Secondary | ICD-10-CM | POA: Diagnosis present

## 2022-09-27 DIAGNOSIS — E876 Hypokalemia: Secondary | ICD-10-CM | POA: Insufficient documentation

## 2022-09-27 DIAGNOSIS — N1832 Chronic kidney disease, stage 3b: Secondary | ICD-10-CM | POA: Diagnosis present

## 2022-09-27 DIAGNOSIS — I5020 Unspecified systolic (congestive) heart failure: Secondary | ICD-10-CM | POA: Diagnosis not present

## 2022-09-27 DIAGNOSIS — J449 Chronic obstructive pulmonary disease, unspecified: Secondary | ICD-10-CM | POA: Diagnosis present

## 2022-09-27 DIAGNOSIS — Z91040 Latex allergy status: Secondary | ICD-10-CM | POA: Insufficient documentation

## 2022-09-27 DIAGNOSIS — I13 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 failure and stage 1 through stage 4 chronic kidney disease, or unspecified chronic kidney disease: Secondary | ICD-10-CM | POA: Diagnosis not present

## 2022-09-27 DIAGNOSIS — F03B0 Unspecified dementia, moderate, without behavioral disturbance, psychotic disturbance, mood disturbance, and anxiety: Secondary | ICD-10-CM | POA: Insufficient documentation

## 2022-09-27 DIAGNOSIS — I259 Chronic ischemic heart disease, unspecified: Secondary | ICD-10-CM | POA: Diagnosis not present

## 2022-09-27 DIAGNOSIS — N1831 Chronic kidney disease, stage 3a: Secondary | ICD-10-CM | POA: Diagnosis not present

## 2022-09-27 DIAGNOSIS — N183 Chronic kidney disease, stage 3 unspecified: Secondary | ICD-10-CM | POA: Diagnosis present

## 2022-09-27 DIAGNOSIS — I4819 Other persistent atrial fibrillation: Secondary | ICD-10-CM | POA: Diagnosis present

## 2022-09-27 DIAGNOSIS — I5022 Chronic systolic (congestive) heart failure: Secondary | ICD-10-CM | POA: Diagnosis not present

## 2022-09-27 DIAGNOSIS — Z86718 Personal history of other venous thrombosis and embolism: Secondary | ICD-10-CM | POA: Diagnosis not present

## 2022-09-27 DIAGNOSIS — J9811 Atelectasis: Secondary | ICD-10-CM | POA: Diagnosis not present

## 2022-09-27 DIAGNOSIS — Z85118 Personal history of other malignant neoplasm of bronchus and lung: Secondary | ICD-10-CM | POA: Insufficient documentation

## 2022-09-27 DIAGNOSIS — J9610 Chronic respiratory failure, unspecified whether with hypoxia or hypercapnia: Secondary | ICD-10-CM | POA: Diagnosis not present

## 2022-09-27 DIAGNOSIS — I2510 Atherosclerotic heart disease of native coronary artery without angina pectoris: Secondary | ICD-10-CM | POA: Diagnosis not present

## 2022-09-27 DIAGNOSIS — Z7901 Long term (current) use of anticoagulants: Secondary | ICD-10-CM | POA: Diagnosis not present

## 2022-09-27 DIAGNOSIS — J438 Other emphysema: Secondary | ICD-10-CM

## 2022-09-27 DIAGNOSIS — I4821 Permanent atrial fibrillation: Secondary | ICD-10-CM | POA: Insufficient documentation

## 2022-09-27 DIAGNOSIS — Z951 Presence of aortocoronary bypass graft: Secondary | ICD-10-CM | POA: Diagnosis not present

## 2022-09-27 DIAGNOSIS — I214 Non-ST elevation (NSTEMI) myocardial infarction: Principal | ICD-10-CM | POA: Diagnosis present

## 2022-09-27 DIAGNOSIS — R0789 Other chest pain: Secondary | ICD-10-CM | POA: Diagnosis not present

## 2022-09-27 DIAGNOSIS — D631 Anemia in chronic kidney disease: Secondary | ICD-10-CM | POA: Insufficient documentation

## 2022-09-27 DIAGNOSIS — D696 Thrombocytopenia, unspecified: Secondary | ICD-10-CM | POA: Diagnosis present

## 2022-09-27 DIAGNOSIS — F0390 Unspecified dementia without behavioral disturbance: Secondary | ICD-10-CM | POA: Diagnosis present

## 2022-09-27 DIAGNOSIS — Z87891 Personal history of nicotine dependence: Secondary | ICD-10-CM | POA: Diagnosis not present

## 2022-09-27 DIAGNOSIS — D649 Anemia, unspecified: Secondary | ICD-10-CM | POA: Diagnosis present

## 2022-09-27 LAB — CBC WITH DIFFERENTIAL/PLATELET
Abs Immature Granulocytes: 0.03 10*3/uL (ref 0.00–0.07)
Basophils Absolute: 0 10*3/uL (ref 0.0–0.1)
Basophils Relative: 0 %
Eosinophils Absolute: 0 10*3/uL (ref 0.0–0.5)
Eosinophils Relative: 0 %
HCT: 29.6 % — ABNORMAL LOW (ref 39.0–52.0)
Hemoglobin: 10.1 g/dL — ABNORMAL LOW (ref 13.0–17.0)
Immature Granulocytes: 0 %
Lymphocytes Relative: 24 %
Lymphs Abs: 1.7 10*3/uL (ref 0.7–4.0)
MCH: 32.3 pg (ref 26.0–34.0)
MCHC: 34.1 g/dL (ref 30.0–36.0)
MCV: 94.6 fL (ref 80.0–100.0)
Monocytes Absolute: 0.6 10*3/uL (ref 0.1–1.0)
Monocytes Relative: 9 %
Neutro Abs: 4.6 10*3/uL (ref 1.7–7.7)
Neutrophils Relative %: 67 %
Platelets: 52 10*3/uL — ABNORMAL LOW (ref 150–400)
RBC: 3.13 MIL/uL — ABNORMAL LOW (ref 4.22–5.81)
RDW: 14.3 % (ref 11.5–15.5)
WBC: 6.9 10*3/uL (ref 4.0–10.5)
nRBC: 0 % (ref 0.0–0.2)

## 2022-09-27 LAB — HEPATIC FUNCTION PANEL
ALT: 11 U/L (ref 0–44)
AST: 20 U/L (ref 15–41)
Albumin: 3 g/dL — ABNORMAL LOW (ref 3.5–5.0)
Alkaline Phosphatase: 60 U/L (ref 38–126)
Bilirubin, Direct: 0.2 mg/dL (ref 0.0–0.2)
Indirect Bilirubin: 0.6 mg/dL (ref 0.3–0.9)
Total Bilirubin: 0.8 mg/dL (ref 0.3–1.2)
Total Protein: 6.1 g/dL — ABNORMAL LOW (ref 6.5–8.1)

## 2022-09-27 LAB — CBC
HCT: 32.4 % — ABNORMAL LOW (ref 39.0–52.0)
Hemoglobin: 10.5 g/dL — ABNORMAL LOW (ref 13.0–17.0)
MCH: 31.3 pg (ref 26.0–34.0)
MCHC: 32.4 g/dL (ref 30.0–36.0)
MCV: 96.7 fL (ref 80.0–100.0)
Platelets: 53 10*3/uL — ABNORMAL LOW (ref 150–400)
RBC: 3.35 MIL/uL — ABNORMAL LOW (ref 4.22–5.81)
RDW: 14.3 % (ref 11.5–15.5)
WBC: 7.7 10*3/uL (ref 4.0–10.5)
nRBC: 0 % (ref 0.0–0.2)

## 2022-09-27 LAB — PROTIME-INR
INR: 1.5 — ABNORMAL HIGH (ref 0.8–1.2)
Prothrombin Time: 18 seconds — ABNORMAL HIGH (ref 11.4–15.2)

## 2022-09-27 LAB — FOLATE: Folate: >40 ng/mL (ref >5.9)

## 2022-09-27 LAB — CK: Total CK: 30 U/L — ABNORMAL LOW (ref 49–397)

## 2022-09-27 LAB — TROPONIN I (HIGH SENSITIVITY)
Troponin I (High Sensitivity): 19 ng/L — ABNORMAL HIGH (ref <18)
Troponin I (High Sensitivity): 20 ng/L — ABNORMAL HIGH (ref <18)

## 2022-09-27 LAB — BASIC METABOLIC PANEL
Anion gap: 12 (ref 5–15)
BUN: 29 mg/dL — ABNORMAL HIGH (ref 8–23)
CO2: 22 mmol/L (ref 22–32)
Calcium: 9.3 mg/dL (ref 8.9–10.3)
Chloride: 102 mmol/L (ref 98–111)
Creatinine, Ser: 1.29 mg/dL — ABNORMAL HIGH (ref 0.61–1.24)
GFR, Estimated: 52 mL/min — ABNORMAL LOW (ref >60)
Glucose, Bld: 115 mg/dL — ABNORMAL HIGH (ref 70–99)
Potassium: 3.7 mmol/L (ref 3.5–5.1)
Sodium: 136 mmol/L (ref 135–145)

## 2022-09-27 LAB — IRON AND TIBC
Iron: 71 ug/dL (ref 45–182)
Saturation Ratios: 27 % (ref 17.9–39.5)
TIBC: 262 ug/dL (ref 250–450)
UIBC: 191 ug/dL

## 2022-09-27 LAB — BRAIN NATRIURETIC PEPTIDE: B Natriuretic Peptide: 559.6 pg/mL — ABNORMAL HIGH (ref 0.0–100.0)

## 2022-09-27 LAB — MAGNESIUM: Magnesium: 1.8 mg/dL (ref 1.7–2.4)

## 2022-09-27 LAB — TSH: TSH: 2.48 u[IU]/mL (ref 0.350–4.500)

## 2022-09-27 LAB — FERRITIN: Ferritin: 88 ng/mL (ref 24–336)

## 2022-09-27 LAB — RETICULOCYTES
Immature Retic Fract: 10.3 % (ref 2.3–15.9)
RBC.: 3.15 MIL/uL — ABNORMAL LOW (ref 4.22–5.81)
Retic Count, Absolute: 49.8 10*3/uL (ref 19.0–186.0)
Retic Ct Pct: 1.6 % (ref 0.4–3.1)

## 2022-09-27 LAB — VITAMIN B12: Vitamin B-12: 641 pg/mL (ref 180–914)

## 2022-09-27 LAB — PHOSPHORUS: Phosphorus: 2.8 mg/dL (ref 2.5–4.6)

## 2022-09-27 MED ORDER — SODIUM CHLORIDE 0.9% FLUSH
3.0000 mL | Freq: Two times a day (BID) | INTRAVENOUS | Status: DC
  Administered 2022-09-28 – 2022-09-29 (×4): 3 mL via INTRAVENOUS

## 2022-09-27 MED ORDER — TAMSULOSIN HCL 0.4 MG PO CAPS
0.4000 mg | ORAL_CAPSULE | Freq: Every day | ORAL | Status: DC
  Administered 2022-09-28: 0.4 mg via ORAL
  Filled 2022-09-27: qty 1

## 2022-09-27 MED ORDER — SODIUM CHLORIDE 0.9% FLUSH: 3.0000 mL | INTRAVENOUS | Status: DC | PRN

## 2022-09-27 MED ORDER — ISOSORBIDE MONONITRATE ER 30 MG PO TB24
30.0000 mg | ORAL_TABLET | Freq: Every day | ORAL | Status: DC
  Administered 2022-09-27 – 2022-09-29 (×3): 30 mg via ORAL
  Filled 2022-09-27 (×3): qty 1

## 2022-09-27 MED ORDER — FINASTERIDE 5 MG PO TABS
5.0000 mg | ORAL_TABLET | Freq: Every day | ORAL | Status: DC
  Administered 2022-09-28 – 2022-09-29 (×2): 5 mg via ORAL
  Filled 2022-09-27 (×2): qty 1

## 2022-09-27 MED ORDER — POLYETHYLENE GLYCOL 3350 17 G PO PACK: 17.0000 g | PACK | Freq: Every day | ORAL | Status: DC | PRN

## 2022-09-27 MED ORDER — PANTOPRAZOLE SODIUM 40 MG PO TBEC
40.0000 mg | DELAYED_RELEASE_TABLET | Freq: Every morning | ORAL | Status: DC
  Administered 2022-09-28 – 2022-09-29 (×2): 40 mg via ORAL
  Filled 2022-09-27 (×2): qty 1

## 2022-09-27 MED ORDER — ASPIRIN 81 MG PO CHEW
324.0000 mg | CHEWABLE_TABLET | Freq: Once | ORAL | Status: AC
  Administered 2022-09-27: 324 mg via ORAL
  Filled 2022-09-27: qty 4

## 2022-09-27 MED ORDER — MOMETASONE FURO-FORMOTEROL FUM 200-5 MCG/ACT IN AERO
2.0000 | INHALATION_SPRAY | Freq: Two times a day (BID) | RESPIRATORY_TRACT | Status: DC
  Administered 2022-09-28 – 2022-09-29 (×3): 2 via RESPIRATORY_TRACT
  Filled 2022-09-27: qty 8.8

## 2022-09-27 MED ORDER — ALBUTEROL SULFATE (2.5 MG/3ML) 0.083% IN NEBU
2.5000 mg | INHALATION_SOLUTION | RESPIRATORY_TRACT | Status: DC | PRN
  Administered 2022-09-28: 2.5 mg via RESPIRATORY_TRACT
  Filled 2022-09-27: qty 3

## 2022-09-27 MED ORDER — FENTANYL CITRATE PF 50 MCG/ML IJ SOSY
12.5000 ug | PREFILLED_SYRINGE | INTRAMUSCULAR | Status: DC | PRN
  Administered 2022-09-28: 50 ug via INTRAVENOUS
  Filled 2022-09-27: qty 1

## 2022-09-27 MED ORDER — ACETAMINOPHEN 650 MG RE SUPP: 650.0000 mg | Freq: Four times a day (QID) | RECTAL | Status: DC | PRN

## 2022-09-27 MED ORDER — SODIUM CHLORIDE 0.9 % IV SOLN: 250.0000 mL | INTRAVENOUS | Status: DC | PRN

## 2022-09-27 MED ORDER — ACETAMINOPHEN 325 MG PO TABS
650.0000 mg | ORAL_TABLET | Freq: Four times a day (QID) | ORAL | Status: DC | PRN
  Administered 2022-09-28 (×2): 650 mg via ORAL
  Filled 2022-09-27 (×2): qty 2

## 2022-09-27 MED ORDER — HYDROCODONE-ACETAMINOPHEN 5-325 MG PO TABS: 1.0000 | ORAL_TABLET | ORAL | Status: DC | PRN

## 2022-09-27 NOTE — Assessment & Plan Note (Signed)
Hold off on Eliquis and heparin given platelets down to 52 Currently rate controlled again.  Continue to monitor

## 2022-09-27 NOTE — ED Triage Notes (Signed)
Pt reports waking up at 4pm today and having CP in his left chest, non radiating.  States he feels shob as well.

## 2022-09-27 NOTE — H&P (Signed)
[name] GNF:[id number] DOB: 1928-03-08 DOA: 09/27/2022     PCP: Donita Brooks, MD   Outpatient Specialists:  CARDS: Dr. Lance Muss, MD   Urology Dr. Annabell Howells  Patient arrived to ER on 09/27/22 at 1711 Referred by Attending Therisa Doyne, MD   Patient coming from:    home Lives alone,     Chief Complaint:   Chief Complaint  Patient presents with   Chest Pain    HPI: Evan [name] is a 89 y.o. male with medical history significant of CAD sp CABG, systolic  CHF EF 84-69% BPH, esophageal ulcer, GERD, HLD, HTN RA, hx of subdural hematoma in the past, HOH, thrombocytopenia    Presented with   chest pain  Presents with CP non radiating associated with SOB prior hx of CAD sp CABG Started at 4 pm after taking a nap Improved with nitroglycerin Hx of DVT/PE on eliquis  Pt woke up with a bad dream when he started to have a CP    Denies significant ETOH intake   Does not smoke     Regarding pertinent Chronic problems:    Hyperlipidemia -  on statins crestor Lipid Panel     Component Value Date/Time   CHOL 91 (L) 06/22/2020 0953   CHOL 97 09/09/2013 0957   TRIG 66 06/22/2020 0953   TRIG 92 09/09/2013 0957   HDL 35 (L) 06/22/2020 0953   HDL 37 (L) 09/09/2013 0957   CHOLHDL 2.6 06/22/2020 0953   CHOLHDL 2.7 02/03/2018 0524   VLDL 12 02/03/2018 0524   LDLCALC 41 06/22/2020 0953   LDLCALC 42 09/09/2013 0957   LABVLDL 15 06/22/2020 0953       chronic CHF diastolic/systolic/ combined - last echo  Recent Results (from the past 62952 hour(s))  ECHOCARDIOGRAM COMPLETE   Collection Time: 04/13/21 10:05 AM  Result Value   S' Lateral 2.60   AV Area VTI 1.88   AV Mean grad 6.5   Single Plane A4C EF 41.0   Single Plane A2C EF 41.4   Calc EF 41.9   AV Area mean vel 1.60   Area-P 1/2 4.57   AR max vel 1.55   AV Peak grad 12.2   Ao pk vel 1.75   Narrative      ECHOCARDIOGRAM REPORT      IMPRESSIONS    1. LVEF is mildly depressed with lateral  hypokinesis. Changes are new from echo done in 2020.Marland Kitchen Left ventricular ejection fraction, by estimation, is 45 to 50%. The left ventricle has severely decreased function. There is mild left ventricular  hypertrophy. Left ventricular diastolic parameters are indeterminate.  2. Right ventricular systolic function is mildly reduced. The right ventricular size is mildly enlarged. There is normal pulmonary artery systolic pressure.  3. Left atrial size was moderately dilated.  4. Right atrial size was moderately dilated.  5. The mitral valve is normal in structure. Mild mitral valve regurgitation.  6. AV is thickened, calcified with restricted motion Peak and mean gradients throgh the valve are 13 and 7 mm Hg respectively. AVA (VTI) is 1.9 cm2. Dimensionless index is 0.42 consistent with mild AS.Marland Kitchen The aortic valve is tricuspid. Aortic valve  regurgitation is not visualized.  7. The inferior vena cava is dilated in size with >50% respiratory variability, suggesting right atrial pressure of 8 mmHg.            CAD  - On   statin,                  -  followed by cardiology                - last cardiac cath        A. Fib -  - CHA2DS2 vas score   7     current  on anticoagulation with  Eliquis,         Hx of DVT/PE on - anticoagulation with   Eliquis,      CKD stage IIIa- baseline Cr 1.2 Estimated Creatinine Clearance: 28.8 mL/min (A) (by C-G formula based on SCr of 1.29 mg/dL (H)).  Lab Results  Component Value Date   CREATININE 1.29 (H) 09/27/2022   CREATININE 1.17 06/28/2022   CREATININE 1.55 (H) 05/17/2022       BPH - on  Proscar      Dementia - mild    Chronic anemia - baseline hg Hemoglobin & Hematocrit  Recent Labs    05/14/22 0103 06/28/22 0935 09/27/22 1731  HGB 8.9* 10.8* 10.5*   Iron/TIBC/Ferritin/ %Sat    Component Value Date/Time   IRON 31 (L) 05/02/2022 0211   TIBC 379 05/02/2022 0211   FERRITIN 25 05/02/2022 0211   IRONPCTSAT 8 (L) 05/02/2022 0211      While in ER: Clinical Course as of 09/27/22 2013  Tue Sep 27, 2022  1803 Discussed initial EKG with Dr. Herbie Baltimore from cardiology.  Does not feel the patient needs Cath Lab at this time [RP]  1927 Dr Izora Ribas from cardiology will see the patient shortly.  [RP]  1949 Dr Adela Glimpse from hospitalist consulted for admission.  [RP]    Clinical Course User Index [RP] [name], MD       Lab Orders         Basic metabolic panel         CBC         Brain natriuretic peptide         CK         Magnesium         Phosphorus         Hepatic function panel         CBC with Differential/Platelet         Prealbumin         Protime-INR         TSH         Vitamin B12         Folate         Iron and TIBC         Ferritin         Reticulocytes          CXR -  NON acuteHypoinflation of the lungs with minimal bibasilar subsegmental atelectasis.    Following Medications were ordered in ER: Medications  aspirin chewable tablet 324 mg (324 mg Oral Given 09/27/22 1957)    _______________________________________________________ ER Provider Called: [facility]     Dr Herbie Baltimore They Recommend admit to medicine    SEEN in ER   ED Triage Vitals  Enc Vitals Group     BP 09/27/22 1716 (!) 111/99     Pulse Rate 09/27/22 1716 94     Resp 09/27/22 1716 17     Temp 09/27/22 1716 97.6 F (36.4 C)     Temp Source 09/27/22 1716 Oral     SpO2 09/27/22 1716 96 %     Weight --      Height --      Head Circumference --  Peak Flow --      Pain Score 09/27/22 1730 6     Pain Loc --      Pain Edu? --      Excl. in GC? --   TMAX(24)@     _________________________________________ Significant initial  Findings: Abnormal Labs Reviewed  BASIC METABOLIC PANEL - Abnormal; Notable for the following components:      Result Value   Glucose, Bld 115 (*)    BUN 29 (*)    Creatinine, Ser 1.29 (*)    GFR, Estimated 52 (*)    All other components within normal limits  CBC - Abnormal;  Notable for the following components:   RBC 3.35 (*)    Hemoglobin 10.5 (*)    HCT 32.4 (*)    Platelets 53 (*)    All other components within normal limits  TROPONIN I (HIGH SENSITIVITY) - Abnormal; Notable for the following components:   Troponin I (High Sensitivity) 19 (*)    All other components within normal limits      _________________________ Troponin  ordered Cardiac Panel (last 3 results) Recent Labs    09/27/22 1731  TROPONINIHS 19*     ECG: Ordered Personally reviewed and interpreted by me showing: HR : 127 Rhythm: Cannot rule out Anterior infarct , age undetermined Marked ST abnormality, possible lateral subendocardial injury Abnormal ECG When compared with ECG of 12-May-2022 03:03, ST changes are new QTC 508  BNP (last 3 results) Recent Labs    04/27/22 1643 05/01/22 1221 05/11/22 2301  BNP 683.2* 176.7* 169.5*     COVID-19 Labs  No results for input(s): "DDIMER", "FERRITIN", "LDH", "CRP" in the last 72 hours.  Lab Results  Component Value Date   SARSCOV2NAA NEGATIVE 05/12/2022   SARSCOV2NAA NEGATIVE 04/28/2022   SARSCOV2NAA NEGATIVE 03/12/2022   SARSCOV2NAA NEGATIVE 11/05/2018     The recent clinical data is shown below. Vitals:   09/27/22 1845 09/27/22 1900 09/27/22 1915 09/27/22 1930  BP: (!) 123/108 (!) 114/59 (!) 148/111 (!) 170/105  Pulse: (!) 128 66 60 66  Resp: (!) 21 (!) 22 18 (!) 22  Temp:      TempSrc:      SpO2: 100% 100% 100% 100%    WBC     Component Value Date/Time   WBC 7.7 09/27/2022 1731   LYMPHSABS 1,152 06/28/2022 0935   LYMPHSABS 1.3 08/16/2016 1155   MONOABS 0.3 05/14/2022 0103   MONOABS 1.2 (H) 08/16/2016 1155   EOSABS 104 06/28/2022 0935   EOSABS 0.1 08/16/2016 1155   BASOSABS 41 06/28/2022 0935   BASOSABS 0.0 08/16/2016 1155         __________________________________________________________ Recent Labs  Lab 09/27/22 1731  NA 136  K 3.7  CO2 22  GLUCOSE 115*  BUN 29*  CREATININE 1.29*  CALCIUM  9.3    C stable,   Lab Results  Component Value Date   CREATININE 1.29 (H) 09/27/2022   CREATININE 1.17 06/28/2022   CREATININE 1.55 (H) 05/17/2022    No results for input(s): "AST", "ALT", "ALKPHOS", "BILITOT", "PROT", "ALBUMIN" in the last 168 hours. Lab Results  Component Value Date   CALCIUM 9.3 09/27/2022   PHOS 3.5 05/14/2022     Plt: Lab Results  Component Value Date   PLT 53 (L) 09/27/2022       Recent Labs  Lab 09/27/22 1731  WBC 7.7  HGB 10.5*  HCT 32.4*  MCV 96.7  PLT 53*    HG/HCT   stable,  Component Value Date/Time   HGB 10.5 (L) 09/27/2022 1731   HGB 10.6 (L) 11/01/2021 1542   HGB 13.4 08/16/2016 1155   HCT 32.4 (L) 09/27/2022 1731   HCT 31.0 (L) 11/01/2021 1542   HCT 39.8 08/16/2016 1155   MCV 96.7 09/27/2022 1731   MCV 93 11/01/2021 1542   MCV 92.3 08/16/2016 1155    __________________________________ Hospitalist was called for admission for   NSTEMI    The following Work up has been ordered so far:  Orders Placed This Encounter  Procedures   DG Chest Portable 1 View   Basic metabolic panel   CBC   Brain natriuretic peptide   CK   Magnesium   Phosphorus   Hepatic function panel   CBC with Differential/Platelet   Prealbumin   Protime-INR   TSH   Vitamin B12   Folate   Iron and TIBC   Ferritin   Reticulocytes   Document Height and Actual Weight   Cardiac Monitoring - Continuous Indefinite   Inpatient consult to Cardiology   Consult to hospitalist   heparin per pharmacy consult   ED EKG   EKG 12-Lead   ED EKG   EKG 12-Lead   Place in observation (patient's expected length of stay will be less than 2 midnights)     OTHER Significant initial  Findings:  labs showing:     DM  labs:  HbA1C: No results for input(s): "HGBA1C" in the last 8760 hours.     CBG (last 3)  No results for input(s): "GLUCAP" in the last 72 hours.        Cultures:    Component Value Date/Time   SDES BLOOD LEFT WRIST 04/29/2022 1503    SDES BLOOD RIGHT HAND 04/29/2022 1503   SPECREQUEST  04/29/2022 1503    BOTTLES DRAWN AEROBIC AND ANAEROBIC Blood Culture adequate volume   SPECREQUEST  04/29/2022 1503    BOTTLES DRAWN AEROBIC AND ANAEROBIC Blood Culture results may not be optimal due to an inadequate volume of blood received in culture bottles   CULT  04/29/2022 1503    NO GROWTH 5 DAYS Performed at [facility], [address] [place], [place] [address]    CULT  04/29/2022 1503    NO GROWTH 5 DAYS Performed at [facility], [address] [place], [place] [address]    REPTSTATUS 05/04/2022 FINAL 04/29/2022 1503   REPTSTATUS 05/04/2022 FINAL 04/29/2022 1503     Radiological Exams on Admission: DG Chest Portable 1 View  Result Date: 09/27/2022 CLINICAL DATA:  Chest pain. EXAM: PORTABLE CHEST 1 VIEW COMPARISON:  May 31, 2022. FINDINGS: Stable cardiomediastinal silhouette. Sternotomy wires are noted. Hypoinflation of the lungs is noted with minimal bibasilar subsegmental atelectasis. Bony thorax is unremarkable. IMPRESSION: Hypoinflation of the lungs with minimal bibasilar subsegmental atelectasis. Electronically Signed   By: Lupita Raider M.D.   On: 09/27/2022 18:26   _______________________________________________________________________________________________________ Latest  Blood pressure (!) 170/105, pulse 66, temperature 98 F (36.7 C), temperature source Oral, resp. rate (!) 22, SpO2 100 %.   Vitals  labs and radiology finding personally reviewed  Review of Systems:    Pertinent positives include:   shortness of breath at rest.  Constitutional:  No weight loss, night sweats, Fevers, chills, fatigue, weight loss  HEENT:  No headaches, Difficulty swallowing,Tooth/dental problems,Sore throat,  No sneezing, itching, ear ache, nasal congestion, post nasal drip,  Cardio-vascular:  No chest pain, Orthopnea, PND, anasarca, dizziness, palpitations.no Bilateral lower extremity  swelling  GI:   No heartburn, indigestion, abdominal pain, nausea, vomiting, diarrhea, change in bowel habits, loss of appetite, melena, blood in stool, hematemesis Resp:  no No dyspnea on exertion, No excess mucus, no productive cough, No non-productive cough, No coughing up of blood.No change in color of mucus.No wheezing. Skin:  no rash or lesions. No jaundice GU:  no dysuria, change in color of urine, no urgency or frequency. No straining to urinate.  No flank pain.  Musculoskeletal:  No joint pain or no joint swelling. No decreased range of motion. No back pain.  Psych:  No change in mood or affect. No depression or anxiety. No memory loss.  Neuro: no localizing neurological complaints, no tingling, no weakness, no double vision, no gait abnormality, no slurred speech, no confusion  All systems reviewed and apart from HOPI all are negative _______________________________________________________________________________________________ Past Medical History:   Past Medical History:  Diagnosis Date   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   Anginal chest pain at rest    Chronic non, controlled on Lorazepam   Anxiety    Arthritis    "all over"    BPH (benign prostatic hyperplasia)    CAD (coronary artery disease)    Chronic lower back pain    Complication of anesthesia    "problems making water afterwards"   Depression    DJD (degenerative joint disease)    Esophageal ulcer without bleeding    bid ppi indefinitely   Family history of adverse reaction to anesthesia    "children w/PONV"   GERD (gastroesophageal reflux disease)    Headache    "couple/week maybe" (09/04/2015)   Hemochromatosis    Possible, elevated iron stores, hook-like osteophytes on hand films, normal LFTs   Hepatitis    "yellow jaundice as a baby"   History of ASCVD    MULTIVESSEL   Hyperlipidemia    Hypertension    Mild aortic stenosis 06/23/2021   Osteoarthritis    Pneumonia    "several times; got a little  now" (09/04/2015)   Rheumatoid arthritis (HCC)    Subdural hematoma (HCC)    small after fall 09/2016-plavix held, neurosurgery consulted.  Asymptomatic.     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 he failed PO Eliquis      Past Surgical History:  Procedure Laterality Date   ARM SKIN LESION BIOPSY / EXCISION Left 09/02/2015   CATARACT EXTRACTION W/ INTRAOCULAR LENS  IMPLANT, BILATERAL Bilateral    CHOLECYSTECTOMY OPEN     CORNEAL TRANSPLANT Bilateral    "one at [facility]; one at [facility]"   CORONARY ANGIOPLASTY WITH STENT PLACEMENT     CORONARY ARTERY BYPASS GRAFT  1999   DESCENDING AORTIC ANEURYSM REPAIR W/ STENT     DE stent ostium into the right radial free graft at OM-, 02-2007   ESOPHAGOGASTRODUODENOSCOPY N/A 11/09/2014   Procedure: ESOPHAGOGASTRODUODENOSCOPY (EGD);  Surgeon: Dorena Cookey, MD;  Location: [facility] ENDOSCOPY;  Service: Endoscopy;  Laterality: N/A;   INGUINAL HERNIA REPAIR     JOINT REPLACEMENT     LEFT HEART CATH AND CORS/GRAFTS ANGIOGRAPHY N/A 06/27/2017   Procedure: LEFT HEART CATH AND CORS/GRAFTS ANGIOGRAPHY;  Surgeon: Lennette Bihari, MD;  Location: [facility] INVASIVE CV LAB;  Service: Cardiovascular;  Laterality: N/A;   NASAL SINUS SURGERY     SHOULDER OPEN ROTATOR CUFF REPAIR Right    TOTAL KNEE ARTHROPLASTY Bilateral     Social History:  Ambulatory  , walker    reports that he has  quit smoking. His smoking use included cigarettes. He has been exposed to tobacco smoke. He has quit using smokeless tobacco.  His smokeless tobacco use included chew. He reports that he does not drink alcohol and does not use drugs.     Family History:   Family History  Problem Relation Age of Onset   Arthritis-Osteo Sister    Heart attack Brother    Heart attack Other    Cancer Neg Hx    _______________________________________________________________________________________ Allergies: Allergies  Allergen Reactions   Neomycin-Polymyxin-Hc Itching   Piroxicam Rash and Other  (See Comments)    Blistering rash  REACTION: Blisters, REACTION: Blisters   Rosuvastatin Calcium Other (See Comments)    Myalgias   Statins Other (See Comments)    Myalgias   Diazepam Other (See Comments)    Dizziness  REACTION:  unknown, REACTION:  unknown   Doxycycline Nausea Only, Itching and Other (See Comments)    REACTION:  unknown, REACTION:  unknown   Gatifloxacin Anxiety and Swelling   Latex Rash     Prior to Admission medications   Medication Sig Start Date End Date Taking? Authorizing Provider  acetaminophen (TYLENOL) 500 MG tablet Take 500 mg by mouth 2 (two) times daily as needed for mild pain, moderate pain, fever or headache.    [provider]  acetaminophen (TYLENOL) 500 MG tablet Take 500 mg by mouth 2 (two) times daily. 05/17/22   [provider]  albuterol (PROVENTIL) (2.5 MG/3ML) 0.083% nebulizer solution Take 3 mLs (2.5 mg total) by nebulization every 4 (four) hours as needed for wheezing or shortness of breath. 05/17/22   [name], York Ram, MD  apixaban (ELIQUIS) 2.5 MG TABS tablet Take 1 tablet (2.5 mg total) by mouth 2 (two) times daily. 08/31/22 10/30/22  Donita Brooks, MD  Bismuth Tribromoph-Petrolatum (XEROFORM PETROLATUM DRES 4"X4") 3 % PADS Apply 1 application  topically daily. 09/22/22   Donita Brooks, MD  budesonide-formoterol (SYMBICORT) 160-4.5 MCG/ACT inhaler Inhale 2 puffs into the lungs in the morning and at bedtime. 05/17/22   [name], York Ram, MD  cephALEXin (KEFLEX) 500 MG capsule Take 1 capsule (500 mg total) by mouth 3 (three) times daily. 09/02/22   Donita Brooks, MD  Cholecalciferol (VITAMIN D-3 PO) Take 1 capsule by mouth in the morning.    [provider]  clotrimazole-betamethasone (LOTRISONE) cream Apply a small amount topically to cleansed affected area 2 (two) times daily for 7 to 10 days 09/05/22     ferrous sulfate (FEROSUL) 325 (65 FE) MG tablet TAKE 1 TABLET EVERY OTHER DAY 08/30/22    Donita Brooks, MD  finasteride (PROSCAR) 5 MG tablet Take 1 tablet (5 mg total) by mouth daily. 04/11/22   Donita Brooks, MD  FLUoxetine (PROZAC) 20 MG capsule TAKE 1 CAPSULE EVERY DAY 06/01/22   Donita Brooks, MD  folic acid (FOLVITE) 1 MG tablet Take 1 tablet (1 mg total) by mouth daily. 08/18/22   Donita Brooks, MD  fosfomycin (MONUROL) 3 g PACK Mix & take 1 packet by mouth , and another packet, 3 days later 09/09/22     furosemide (LASIX) 40 MG tablet Take 1 tablet (40 mg total) by mouth daily. 05/17/22 06/16/22  [name], York Ram, MD  Gauze Pads & Dressings [facility] FOAM DRESSNG/SACRAL) [phone] PADS Apply daily at 6 (six) AM. 09/22/22   Donita Brooks, MD  guaiFENesin (ROBITUSSIN) 100 MG/5ML liquid Take 10 mLs by mouth every 4 (four) hours as needed for  cough or to loosen phlegm. 05/26/22   Donita Brooks, MD  Lifitegrast Benay Spice) 5 % SOLN Place 1 drop into both eyes 2 (two) times daily. Patient taking differently: Place 1 drop into both eyes as needed (dry eyes). 12/01/21     pantoprazole (PROTONIX) 40 MG tablet TAKE 1 TABLET EVERY MORNING 07/01/22   Donita Brooks, MD  polyethylene glycol (MIRALAX / GLYCOLAX) 17 g packet Take 17 g by mouth daily as needed for mild constipation. 08/14/18   Angiulli, Mcarthur Rossetti, PA-C  rosuvastatin (CRESTOR) 5 MG tablet TAKE 1 TABLET EVERY DAY Patient taking differently: Take 5 mg by mouth at bedtime. 05/06/22   Corky Crafts, MD  senna-docusate (STIMULANT LAXATIVE) 8.6-50 MG tablet Take 2 tablets by mouth 2 (two) times daily as needed for constipation 07/21/22     sodium fluoride (SF 5000 PLUS) 1.1 % CREA dental cream Brush on teeth for 3-5 minutes then spit once per day 09/13/22     sulfamethoxazole-trimethoprim (BACTRIM DS) 800-160 MG tablet Take 1 tablet by mouth 2 (two) times daily with food 09/05/22     tacrolimus (PROTOPIC) 0.1 % ointment Apply topically 2 (two) times daily. 02/08/19   [provider]  tamsulosin (FLOMAX) 0.4 MG  CAPS capsule TAKE 1 CAPSULE EVERY DAY Patient taking differently: Take 0.4 mg by mouth daily with supper. 01/10/22   Donita Brooks, MD  Zinc Oxide (TRIPLE PASTE) 12.8 % ointment Apply topically 4 (four) times daily as needed (skin barrier). Patient taking differently: Apply 1 Application topically 2 (two) times daily as needed (skin barrier). 05/03/22   Zigmund Daniel., MD    ___________________________________________________________________________________________________ Physical Exam:    09/27/2022    7:30 PM 09/27/2022    7:15 PM 09/27/2022    7:00 PM  Vitals with BMI  Systolic 170 148 706  Diastolic 105 111 59  Pulse 66 60 66     1. General:  in No  Acute distress   Chronically ill   -appearing 2. Psychological: Alert and   Oriented 3. Head/ENT:  Dry Mucous Membranes                          Head Non traumatic, neck supple                        Poor Dentition 4. SKIN:  decreased Skin turgor,  Skin clean Dry and intact no rash    5. Heart: Regular rate and rhythm no  Murmur, no Rub or gallop 6. Lungs:  no wheezes or crackles   7. Abdomen: Soft,  non-tender, Non distended  bowel sounds present 8. Lower extremities: no clubbing, cyanosis, no  edema 9. Neurologically Grossly intact, moving all 4 extremities equally   10. MSK: Normal range of motion    Chart has been reviewed  ______________________________________________________________________________________________  Assessment/Plan 89 y.o. male with medical history significant of CAD sp CABG, systolic  CHF EF 23-76% BPH, esophageal ulcer, GERD, HLD, HTN RA, hx of subdural hematoma in the past, HOH   Admitted for   NSTEMI   Chest pain due to myocardial ischemia,      Present on Admission:  NSTEMI (non-ST elevated myocardial infarction) (HCC)  HFmrEF (heart failure with mildly reduced ejection fraction) (HCC)  History of pulmonary embolism  Chronic respiratory failure, unsp w hypoxia or hypercapnia  (HCC)  Persistent atrial fibrillation (HCC)  COPD (chronic obstructive pulmonary disease) (HCC)  Thrombocytopenia (HCC)  CKD (chronic kidney disease), stage III (HCC)  Anemia  Dementia without behavioral disturbance (HCC)     HFmrEF (heart failure with mildly reduced ejection fraction) (HCC) Chronic stable hold off on Lasix for tonight given tachycardia monitor renal function  History of pulmonary embolism Platelets now down to 52.  For tonight we will hold off on Eliquis we will continue to monitor platelets to see if would be safe to resume.  Hold off on heparin for tonight patient currently chest pain-free  Chronic respiratory failure, unsp w hypoxia or hypercapnia (HCC) Current stable provide oxygen as needed  Persistent atrial fibrillation (HCC) Hold off on Eliquis and heparin given platelets down to 52 Currently rate controlled again.  Continue to monitor  COPD (chronic obstructive pulmonary disease) (HCC) Chronic stable not contributing to current symptoms will restart home medications  NSTEMI (non-ST elevated myocardial infarction) [facility]) Appreciate cardiology help.  Obtain echogram.  Patient given a full dose of aspirin.  For tonight hold Eliquis and heparin given thrombocytopenia.  Patient repeat EKG improving.  Continue to monitor cardiac markers.  Cardiology is aware felt this point patient would not be a good candidate for cardiac catheterization  Thrombocytopenia (HCC) Chronic but continues to progress patient probably would benefit from outpatient workup with hematology oncology  CKD (chronic kidney disease), stage III (HCC)  -chronic avoid nephrotoxic medications such as NSAIDs, Vanco Zosyn combo,  avoid hypotension, continue to follow renal function   Anemia Obtain anemia panel   Dementia without behavioral disturbance (HCC) Stable Chronic monitor for any sundowning   Other plan as per orders.  DVT prophylaxis:  SCD     Code Status:    Code Status: Prior  FULL CODE as per patient   I had personally discussed CODE STATUS with patient and family  ACP has been reviewed     Family Communication:   Family  at  Bedside  plan of care was discussed  with   Daughter,    Diet  heart healthy   Disposition Plan:      To home once workup is complete and patient is stable   Following barriers for discharge:                            Electrolytes corrected                               Anemia corrected                                                       Will need consultants to evaluate patient prior to discharge       Consult Orders  (From admission, onward)           Start     Ordered   09/27/22 1920  Consult to hospitalist  Pg by Viviann Spare  Once       Provider:  (Not yet assigned)  Question Answer Comment  Place call to: [facility]   Reason for Consult Admit      09/27/22 1920  Would benefit from PT/OT eval prior to DC  Ordered                     Consults called:     Treatment Team:  Lbcardiology, Rounding, MD  Admission status:  ED Disposition     ED Disposition  Admit   Condition  --   Comment  Hospital Area: [facility] [[id number]]  Level of Care: Progressive [102]  Admit to Progressive based on following criteria: CARDIOVASCULAR & THORACIC of moderate stability with acute coronary syndrome symptoms/low risk myocardial infarction/hypertensive urgency/arrhythmias/heart failure potentially compromising stability and stable post cardiovascular intervention patients.  May place patient in observation at [facility] or [facility] if equivalent level of care is available:: No  Covid Evaluation: Asymptomatic - no recent exposure (last 10 days) testing not required  Diagnosis: NSTEMI (non-ST elevated myocardial infarction) [facility]) [[id number]]  Admitting Physician: Therisa Doyne [[id number]]  Attending Physician: Therisa Doyne [[id number]]           Obs       Level  of care       progressive tele indefinitely please discontinue once patient no longer qualifies COVID-19 Labs      Uriah Trueba 09/27/2022, 9:11 PM    [facility]     after 2 AM please page floor coverage PA If 7AM-7PM, please contact the day team taking care of the patient using [email or web address]

## 2022-09-27 NOTE — Assessment & Plan Note (Signed)
Appreciate cardiology help.  Obtain echogram.  Patient given a full dose of aspirin.  For tonight hold Eliquis and heparin given thrombocytopenia.  Patient repeat EKG improving.  Continue to monitor cardiac markers.  Cardiology is aware felt this point patient would not be a good candidate for cardiac catheterization

## 2022-09-27 NOTE — ED Provider Notes (Addendum)
[place] EMERGENCY DEPARTMENT AT [facility] Provider Note   CSN: [id number] Arrival date & time: 09/27/22  1711     History  Chief Complaint  Patient presents with   Chest Pain    Evan [name] is a 89 y.o. male.  89 year old male with history of dementia, CAD status post CABG and PCI, PE on Eliquis, hypertension, and hyperlipidemia presents emergency department chest pain.  Patient was taking a nap today and woke at 4 PM and started complaining of chest pain.  Does have dementia so has some difficulty describing it but was given nitroglycerin and reports that the chest pain resolved.  Also was feeling some shortness of breath and dizziness.  Has been having occasional palpitations this week as well.  Has been compliant with his Eliquis.  Is wheelchair-bound and typically alert and oriented to self, occasionally place, but not date at baseline.       Home Medications Prior to Admission medications   Medication Sig Start Date End Date Taking? Authorizing Provider  acetaminophen (TYLENOL) 500 MG tablet Take 500 mg by mouth 2 (two) times daily as needed for mild pain, moderate pain, fever or headache.    [provider]  acetaminophen (TYLENOL) 500 MG tablet Take 500 mg by mouth 2 (two) times daily. 05/17/22   [provider]  albuterol (PROVENTIL) (2.5 MG/3ML) 0.083% nebulizer solution Take 3 mLs (2.5 mg total) by nebulization every 4 (four) hours as needed for wheezing or shortness of breath. 05/17/22   [name], York Ram, MD  apixaban (ELIQUIS) 2.5 MG TABS tablet Take 1 tablet (2.5 mg total) by mouth 2 (two) times daily. 08/31/22 10/30/22  Donita Brooks, MD  Bismuth Tribromoph-Petrolatum (XEROFORM PETROLATUM DRES 4"X4") 3 % PADS Apply 1 application  topically daily. 09/22/22   Donita Brooks, MD  budesonide-formoterol (SYMBICORT) 160-4.5 MCG/ACT inhaler Inhale 2 puffs into the lungs in the morning and at bedtime. 05/17/22   [name], York Ram,  MD  cephALEXin (KEFLEX) 500 MG capsule Take 1 capsule (500 mg total) by mouth 3 (three) times daily. 09/02/22   Donita Brooks, MD  Cholecalciferol (VITAMIN D-3 PO) Take 1 capsule by mouth in the morning.    [provider]  clotrimazole-betamethasone (LOTRISONE) cream Apply a small amount topically to cleansed affected area 2 (two) times daily for 7 to 10 days 09/05/22     ferrous sulfate (FEROSUL) 325 (65 FE) MG tablet TAKE 1 TABLET EVERY OTHER DAY 08/30/22   Donita Brooks, MD  finasteride (PROSCAR) 5 MG tablet Take 1 tablet (5 mg total) by mouth daily. 04/11/22   Donita Brooks, MD  FLUoxetine (PROZAC) 20 MG capsule TAKE 1 CAPSULE EVERY DAY 06/01/22   Donita Brooks, MD  folic acid (FOLVITE) 1 MG tablet Take 1 tablet (1 mg total) by mouth daily. 08/18/22   Donita Brooks, MD  fosfomycin (MONUROL) 3 g PACK Mix & take 1 packet by mouth , and another packet, 3 days later 09/09/22     furosemide (LASIX) 40 MG tablet Take 1 tablet (40 mg total) by mouth daily. 05/17/22 06/16/22  [name], York Ram, MD  Gauze Pads & Dressings [facility] FOAM DRESSNG/SACRAL) [phone]" PADS Apply daily at 6 (six) AM. 09/22/22   Pickard, Priscille Heidelberg, MD  guaiFENesin (ROBITUSSIN) 100 MG/5ML liquid Take 10 mLs by mouth every 4 (four) hours as needed for cough or to loosen phlegm. 05/26/22   Donita Brooks, MD  Lifitegrast Benay Spice) 5 % SOLN  Place 1 drop into both eyes 2 (two) times daily. Patient taking differently: Place 1 drop into both eyes as needed (dry eyes). 12/01/21     pantoprazole (PROTONIX) 40 MG tablet TAKE 1 TABLET EVERY MORNING 07/01/22   Donita Brooks, MD  polyethylene glycol (MIRALAX / GLYCOLAX) 17 g packet Take 17 g by mouth daily as needed for mild constipation. 08/14/18   Angiulli, Mcarthur Rossetti, PA-C  rosuvastatin (CRESTOR) 5 MG tablet TAKE 1 TABLET EVERY DAY Patient taking differently: Take 5 mg by mouth at bedtime. 05/06/22   Corky Crafts, MD  senna-docusate (STIMULANT LAXATIVE) 8.6-50  MG tablet Take 2 tablets by mouth 2 (two) times daily as needed for constipation 07/21/22     sodium fluoride (SF 5000 PLUS) 1.1 % CREA dental cream Brush on teeth for 3-5 minutes then spit once per day 09/13/22     sulfamethoxazole-trimethoprim (BACTRIM DS) 800-160 MG tablet Take 1 tablet by mouth 2 (two) times daily with food 09/05/22     tacrolimus (PROTOPIC) 0.1 % ointment Apply topically 2 (two) times daily. 02/08/19   [provider]  tamsulosin (FLOMAX) 0.4 MG CAPS capsule TAKE 1 CAPSULE EVERY DAY Patient taking differently: Take 0.4 mg by mouth daily with supper. 01/10/22   Donita Brooks, MD  Zinc Oxide (TRIPLE PASTE) 12.8 % ointment Apply topically 4 (four) times daily as needed (skin barrier). Patient taking differently: Apply 1 Application topically 2 (two) times daily as needed (skin barrier). 05/03/22   Zigmund Daniel., MD      Allergies    Neomycin-polymyxin-hc, Piroxicam, Rosuvastatin calcium, Statins, Diazepam, Doxycycline, Gatifloxacin, and Latex    Review of Systems   Review of Systems  Physical Exam Updated Vital Signs BP (!) 170/105   Pulse 66   Temp 98 F (36.7 C) (Oral)   Resp (!) 22   SpO2 100%  Physical Exam Vitals and nursing note reviewed.  Constitutional:      General: He is not in acute distress.    Appearance: He is well-developed.  HENT:     Head: Normocephalic and atraumatic.     Right Ear: External ear normal.     Left Ear: External ear normal.     Nose: Nose normal.  Eyes:     Extraocular Movements: Extraocular movements intact.     Conjunctiva/sclera: Conjunctivae normal.     Pupils: Pupils are equal, round, and reactive to light.  Cardiovascular:     Rate and Rhythm: Normal rate and regular rhythm.     Heart sounds: Normal heart sounds.     Comments: Chest pain not reproducible Pulmonary:     Effort: Pulmonary effort is normal. No respiratory distress.     Breath sounds: Rales (Right base) present.  Abdominal:     General:  There is no distension.     Palpations: Abdomen is soft. There is no mass.     Tenderness: There is no abdominal tenderness. There is no guarding.  Musculoskeletal:     Cervical back: Normal range of motion and neck supple.  Skin:    General: Skin is warm and dry.  Neurological:     Mental Status: He is alert. Mental status is at baseline.  Psychiatric:        Mood and Affect: Mood normal.        Behavior: Behavior normal.     ED Results / Procedures / Treatments   Labs (all labs ordered are listed, but only abnormal results are displayed)  Labs Reviewed  BASIC METABOLIC PANEL - Abnormal; Notable for the following components:      Result Value   Glucose, Bld 115 (*)    BUN 29 (*)    Creatinine, Ser 1.29 (*)    GFR, Estimated 52 (*)    All other components within normal limits  CBC - Abnormal; Notable for the following components:   RBC 3.35 (*)    Hemoglobin 10.5 (*)    HCT 32.4 (*)    Platelets 53 (*)    All other components within normal limits  TROPONIN I (HIGH SENSITIVITY) - Abnormal; Notable for the following components:   Troponin I (High Sensitivity) 19 (*)    All other components within normal limits  BRAIN NATRIURETIC PEPTIDE  CK  MAGNESIUM  PHOSPHORUS  HEPATIC FUNCTION PANEL  CBC WITH DIFFERENTIAL/PLATELET  PREALBUMIN  PROTIME-INR  TSH  VITAMIN B12  FOLATE  IRON AND TIBC  FERRITIN  RETICULOCYTES  TROPONIN I (HIGH SENSITIVITY)  TROPONIN I (HIGH SENSITIVITY)    EKG EKG Interpretation  Date/Time:  Tuesday September 27 2022 19:10:07 EDT Ventricular Rate:  62 PR Interval:    QRS Duration: 114 QT Interval:  464 QTC Calculation: 472 R Axis:   4 Text Interpretation: Atrial fibrillation Borderline intraventricular conduction delay Low voltage, precordial leads Abnormal T, consider ischemia, diffuse leads Confirmed by Vonita Moss [phone]) on 09/27/2022 7:18:48 PM  Radiology DG Chest Portable 1 View  Result Date: 09/27/2022 CLINICAL DATA:  Chest pain.  EXAM: PORTABLE CHEST 1 VIEW COMPARISON:  May 31, 2022. FINDINGS: Stable cardiomediastinal silhouette. Sternotomy wires are noted. Hypoinflation of the lungs is noted with minimal bibasilar subsegmental atelectasis. Bony thorax is unremarkable. IMPRESSION: Hypoinflation of the lungs with minimal bibasilar subsegmental atelectasis. Electronically Signed   By: Lupita Raider M.D.   On: 09/27/2022 18:26    Procedures Procedures    Medications Ordered in ED Medications  aspirin chewable tablet 324 mg (324 mg Oral Given 09/27/22 1957)    ED Course/ Medical Decision Making/ A&P Clinical Course as of 09/27/22 2012  Tue Sep 27, 2022  1803 Discussed initial EKG with Dr. Herbie Baltimore from cardiology.  Does not feel the patient needs Cath Lab at this time [RP]  1927 Dr Izora Ribas from cardiology will see the patient shortly.  [RP]  1949 Dr Adela Glimpse from hospitalist consulted for admission.  [RP]    Clinical Course User Index [RP] [name], MD                             Medical Decision Making Amount and/or Complexity of Data Reviewed Labs: ordered. Radiology: ordered.  Risk OTC drugs. Decision regarding hospitalization.   BAYAN KUSHNIR is a 89 y.o. male with comorbidities that complicate the patient evaluation including dementia, CAD status post CABG and PCI, PE on Eliquis, hypertension, and hyperlipidemia presents emergency department chest pain.   Initial Ddx:  ACS, PE, pneumonia, MSK pain  MDM/Course:  Concerned about possible ACS given the patient's history and EKG that did show some ischemic changes on arrival.  There was elevation in lead aVR so was discussed with the on-call STEMI cardiologist who did not feel the patient warranted trip to Cath Lab at this time.  Repeat EKG after the patient's heart rate had improved did show persistent T wave inversions in the inferior and anterolateral leads but no elevations.  Suspect that this may have been demand related.  Could  consider pulmonary  embolism but patient has been compliant with his anticoagulation and is not hypoxic so feel that this is less likely and if present would not change his management at all.  Upon re-evaluation patient remained stable and chest pain-free.  Was given aspirin and admitted to medicine for further management.  Started on heparin drip as well.  Did verify full code with the family.  Did talk about heart catheterization with them and they said they would be open to it if it was safe for him but at his age they do have some concerns.  This patient presents to the ED for concern of complaints listed in HPI, this involves an extensive number of treatment options, and is a complaint that carries with it a high risk of complications and morbidity. Disposition including potential need for admission considered.   Dispo: Admit to Floor  Additional history obtained from family Records reviewed Outpatient Clinic Notes The following labs were independently interpreted: Chemistry and show CKD I independently reviewed the following imaging with scope of interpretation limited to determining acute life threatening conditions related to emergency care: Chest x-ray and agree with the radiologist interpretation with the following exceptions: none I personally reviewed and interpreted cardiac monitoring: normal sinus rhythm  I personally reviewed and interpreted the pt's EKG: see above for interpretation  I have reviewed the patients home medications and made adjustments as needed Consults: Cardiology and Hospitalist Social Determinants of health:  Elderly         Final Clinical Impression(s) / ED Diagnoses Final diagnoses:  NSTEMI (non-ST elevated myocardial infarction) (HCC)  Chest pain due to myocardial ischemia, unspecified ischemic chest pain type  Moderate dementia, unspecified dementia type, unspecified whether behavioral, psychotic, or mood disturbance or anxiety (HCC)    Rx / DC  Orders ED Discharge Orders     None      CRITICAL CARE Performed by: [name]   Total critical care time: 45 minutes  Critical care time was exclusive of separately billable procedures and treating other patients.  Critical care was necessary to treat or prevent imminent or life-threatening deterioration.  Critical care was time spent personally by me on the following activities: development of treatment plan with patient and/or surrogate as well as nursing, discussions with consultants, evaluation of patient's response to treatment, examination of patient, obtaining history from patient or surrogate, ordering and performing treatments and interventions, ordering and review of laboratory studies, ordering and review of radiographic studies, pulse oximetry and re-evaluation of patient's condition.     [name], MD 09/27/22 2012

## 2022-09-27 NOTE — Assessment & Plan Note (Signed)
Current stable provide oxygen as needed

## 2022-09-27 NOTE — Consult Note (Signed)
Cardiology Consult:   Patient ID: Evan [name]; MRN: [id number]; DOB: 1927-08-07   Admission date: 09/27/2022  Primary Care Provider: Donita Brooks, MD Primary Cardiologist: Dr. Eldridge Dace  Chief Complaint:  Chest pain   Patient Profile:   Evan [name] is a 89 y.o. male Complex CAD (s/p CABG in 1998, Prior LCX territory PCI both native and radial to OM1), HF with mildly reduced EF ~ 45%, Permanent Atrial Fibrillation (SDH with in 2018 but successful restart of AC), prior lung cancer with dementia.   History of Present Illness:   Evan [name] is feeling poorly now but no longer with chest pain. He has pain all over.  He is oriented to person and place (hospital).  He does not remember having chest pain or any cardiac symptoms.  He has significant dementia.  His son and daugther are at bedside.  His son or daugther often stay with him.   Daughter notes that at baseline, he gets up but is wheelchair bound and is minimally active.  When he uses the restroom they note that his heart rate is elevated and his blood pressure is elevated.  At baseline his blood pressure is elevated.  At his last hospital visit his metoprolol was held for bradycardia.  Son notes that a bit before 4 PM today patient was asleep.  He woke up and said "Hurry up, I'm going to be later for work."  After this developed chest pain.  Sought emergent care.  Chest pain improved with nitroglycerin.  He had notable ST changes that resolves with improvement in heart rate.  Received ASA 324 mg X1.  First troponin was negative.  Second was pending.  IC on call reviewed case and did not feel case was appropriate for heart catheterization.    At time of eval, denies, chest pain, SOB, or palpitations.  Notes body aches all over.  Allergies:    Allergies  Allergen Reactions   Neomycin-Polymyxin-Hc Itching   Piroxicam Rash and Other (See Comments)    Blistering rash  REACTION: Blisters, REACTION: Blisters   Rosuvastatin  Calcium Other (See Comments)    Myalgias   Statins Other (See Comments)    Myalgias   Diazepam Other (See Comments)    Dizziness  REACTION:  unknown, REACTION:  unknown   Doxycycline Nausea Only, Itching and Other (See Comments)    REACTION:  unknown, REACTION:  unknown   Gatifloxacin Anxiety and Swelling   Latex Rash    Social History:   Social History   Socioeconomic History   Marital status: Widowed    Spouse name: Not on file   Number of children: 2   Years of education: 65   Highest education level: 12th grade  Occupational History   Not on file  Tobacco Use   Smoking status: Former    Types: Cigarettes    Passive exposure: Yes   Smokeless tobacco: Former    Types: Chew   Tobacco comments:    "quit chewing in the 1960s"    Verified by Daughter, [name]  Vaping Use   Vaping Use: Never used  Substance and Sexual Activity   Alcohol use: No   Drug use: No   Sexual activity: Never  Other Topics Concern   Not on file  Social History Narrative   Not on file   Social Determinants of Health   Financial Resource Strain: Low Risk  (07/25/2022)   Overall Financial Resource Strain (CARDIA)    Difficulty  of Paying Living Expenses: Not hard at all  Food Insecurity: No Food Insecurity (07/25/2022)   Hunger Vital Sign    Worried About Running Out of Food in the Last Year: Never true    Ran Out of Food in the Last Year: Never true  Transportation Needs: No Transportation Needs (07/25/2022)   PRAPARE - Administrator, Civil Service (Medical): No    Lack of Transportation (Non-Medical): No  Physical Activity: Inactive (06/30/2022)   Exercise Vital Sign    Days of Exercise per Week: 0 days    Minutes of Exercise per Session: 0 min  Stress: No Stress Concern Present (07/25/2022)   [employer] of Occupational Health - Occupational Stress Questionnaire    Feeling of Stress : Only a little  Social Connections: Moderately Isolated (06/30/2022)   Social  Connection and Isolation Panel [NHANES]    Frequency of Communication with Friends and Family: More than three times a week    Frequency of Social Gatherings with Friends and Family: More than three times a week    Attends Religious Services: 1 to 4 times per year    Active Member of [employer] or Organizations: No    Attends Banker Meetings: Never    Marital Status: Widowed  Intimate Partner Violence: Not At Risk (07/25/2022)   Humiliation, Afraid, Rape, and Kick questionnaire    Fear of Current or Ex-Partner: No    Emotionally Abused: No    Physically Abused: No    Sexually Abused: No    Family History:   The patient's family history includes Arthritis-Osteo in his sister; Heart attack in his brother and another family member. There is no history of Cancer.    ROS:  Please see the history of present illness.   Physical Exam/Data:   Vitals:   09/27/22 1845 09/27/22 1900 09/27/22 1915 09/27/22 1930  BP: (!) 123/108 (!) 114/59 (!) 148/111 (!) 170/105  Pulse: (!) 128 66 60 66  Resp: (!) 21 (!) 22 18 (!) 22  Temp:      TempSrc:      SpO2: 100% 100% 100% 100%   No intake or output data in the 24 hours ending 09/27/22 2016 There were no vitals filed for this visit. There is no height or weight on file to calculate BMI.   Gen: No distress, Elderly Male , thin male Neck: No JVD,  Ears:  Homero Fellers Sign, Hearing aids are in but still have hearing deficit Cardiac: No Rubs or Gallops, systolic murmur, IRIR  Respiratory: Clear to auscultation bilaterally, normal effort, normal  respiratory rate GI: Soft, nontender, non-distended  MS: No  edema;  moves all extremities Integument: Skin feels warm, median sternotomy scar noted Neuro:  At time of evaluation, alert and oriented to person and place   EKG:   09/27/22: AF RVR with profound ST changes 09/27/22: post AV nodal therapy: AF with inferolateral TWI inversions  Relevant CV Studies:  Cardiac Studies & Procedures   CARDIAC  CATHETERIZATION  CARDIAC CATHETERIZATION 06/27/2017  Narrative  Prox RCA lesion is 30% stenosed.  Mid RCA lesion is 10% stenosed.  Ost 1st Mrg lesion is 80% stenosed.  Previously placed Prox Cx stent (unknown type) is widely patent.  Ost 2nd Mrg lesion is 65% stenosed.  Ost LAD lesion is 100% stenosed.  Previously placed Origin to Prox Graft stent (unknown type) is widely patent.  The left ventricular ejection fraction is 55-65% by visual estimate.  LV end diastolic pressure is  normal.  The left ventricular systolic function is normal.  Hyperdynamic LV function with a spade-like ventricle and ejection fraction at 60-65%.  Multi vessel native CAD with total occlusion of the very proximal LAD; 75% ostial stenosis of the OM1 vessel with a patent LCX stent between the OM1 and OM 2 vessels, and 65-70% ostial narrowing at the origin of the OM 2 vessel; dominant RCA with 30% proximal and 10% mid stenosis.  Widely patent free radial graft supplying the OM1 vessel with a patent ostial proximal stent in the graft.  Patent LIMA graft supplying the LAD system.  RECOMMENDATION: Medical therapy.  Findings Coronary Findings Diagnostic  Dominance: Right  Left Anterior Descending Ost LAD lesion is 100% stenosed.  Left Circumflex Previously placed Prox Cx stent (unknown type) is widely patent.  First Obtuse Marginal Branch Ost 1st Mrg lesion is 80% stenosed.  Second Obtuse Marginal Branch Ost 2nd Mrg lesion is 65% stenosed.  Right Coronary Artery Prox RCA lesion is 30% stenosed. Mid RCA lesion is 10% stenosed.  LIMA Graft To Mid LAD  Graft To 1st Mrg Previously placed Origin to Prox Graft stent (unknown type) is widely patent.  Intervention  No interventions have been documented.     ECHOCARDIOGRAM  ECHOCARDIOGRAM LIMITED 04/28/2022  Narrative ECHOCARDIOGRAM LIMITED REPORT    Patient Name:   Evan [name] Date of Exam: 04/28/2022 Medical Rec #:  [id number]     Height:       63.0 in Accession #:    [id number]   Weight:       170.0 lb Date of Birth:  09-29-1927    BSA:          1.805 m Patient Age:    89 years     BP:           124/59 mmHg Patient Gender: M            HR:           60 bpm. Exam Location:  Inpatient  Procedure: Limited Echo, Cardiac Doppler and Limited Color Doppler  Indications:    CHF-Acute Systolic I50.21  History:        Patient has prior history of Echocardiogram examinations, most recent 04/13/2021. CAD, Aortic Valve Disease, Arrythmias:Atrial Fibrillation, Signs/Symptoms:Shortness of Breath; Risk Factors:Non-Smoker, Hypertension and Dyslipidemia.  Sonographer:    Aron Baba Referring Phys: [id number] RONDELL A SMITH   Sonographer Comments: Image acquisition challenging due to patient body habitus and Image acquisition challenging due to respiratory motion. IMPRESSIONS   1. Assessing wall motion is challenging. Left ventricular ejection fraction, by estimation, is 50 to 55%. The left ventricle has low normal function. 2. Right ventricular systolic function is mildly reduced. The right ventricular size is moderately enlarged. There is moderately elevated pulmonary artery systolic pressure. 3. Left atrial size was moderately dilated. 4. Trivial mitral valve regurgitation. 5. Aortic valve regurgitation is not visualized. 6. The inferior vena cava is normal in size with <50% respiratory variability, suggesting right atrial pressure of 8 mmHg.  FINDINGS Left Ventricle: Assessing wall motion is challenging. Left ventricular ejection fraction, by estimation, is 50 to 55%. The left ventricle has low normal function. There is no left ventricular hypertrophy.  Right Ventricle: The right ventricular size is moderately enlarged. Right ventricular systolic function is mildly reduced. There is moderately elevated pulmonary artery systolic pressure. The tricuspid regurgitant velocity is 2.93 m/s, and with an assumed right atrial  pressure of 15 mmHg, the estimated right ventricular systolic pressure is  49.3 mmHg.  Left Atrium: Left atrial size was moderately dilated.  Right Atrium: Right atrial size was normal in size.  Mitral Valve: Trivial mitral valve regurgitation.  Tricuspid Valve: Tricuspid valve regurgitation is mild.  Aortic Valve: Aortic valve regurgitation is not visualized.  Pulmonic Valve: Pulmonic valve regurgitation is not visualized.  Venous: The inferior vena cava is normal in size with less than 50% respiratory variability, suggesting right atrial pressure of 8 mmHg.  Additional Comments: Spectral Doppler performed. Color Doppler performed.  LEFT VENTRICLE PLAX 2D LVIDd:         3.80 cm LVIDs:         3.00 cm LV PW:         1.00 cm LV IVS:        0.90 cm   MITRAL VALVE               TRICUSPID VALVE MV Area (PHT): 6.32 cm    TR Peak grad:   34.3 mmHg MV Decel Time: 120 msec    TR Vmax:        293.00 cm/s MV E velocity: 92.20 cm/s MV A velocity: 37.80 cm/s MV E/A ratio:  2.44  Photographer signed by Carolan Clines Signature Date/Time: 04/28/2022/5:35:41 PM    Final             Laboratory Data:  Chemistry Recent Labs  Lab 09/27/22 1731  NA 136  K 3.7  CL 102  CO2 22  GLUCOSE 115*  BUN 29*  CREATININE 1.29*  CALCIUM 9.3  GFRNONAA 52*  ANIONGAP 12    No results for input(s): "PROT", "ALBUMIN", "AST", "ALT", "ALKPHOS", "BILITOT" in the last 168 hours. Hematology Recent Labs  Lab 09/27/22 1731  WBC 7.7  RBC 3.35*  HGB 10.5*  HCT 32.4*  MCV 96.7  MCH 31.3  MCHC 32.4  RDW 14.3  PLT 53*   Cardiac EnzymesNo results for input(s): "TROPONINI" in the last 168 hours. No results for input(s): "TROPIPOC" in the last 168 hours.  BNPNo results for input(s): "BNP", "PROBNP" in the last 168 hours.  DDimer No results for input(s): "DDIMER" in the last 168 hours.   Assessment and Plan:   Complex CAD  (s/p CABG in 1998, Prior LCX territory PCI both native  and radial to OM1) Significant Dementia - second troponin pending; he likely has significant underlying coronary disease give ST changes at higher rates but in the setting of age and comorbidity I agree with the STEMI on call's recommendation, heart catheterization is likely not appropriate; discussed with son and daughter and discussed the risks of procedural intervention at his age - if second troponin is significantly elevated, 48 hours of heparin is reasonable - medications that improve is quality of life are reasonable (adding imdur 30 mg PO Daily, if resting heart rates increase will restart low dose BB -Will likely stop patients rosuvastatin in the setting of the above  HFmrEF - Chronic euvolemic will not attempt to uptitrate GDMT in the setting of age and dementia - continue lasix  Permanent AF - with prior SDH but without future falls tolerating AC - will continue DOAC for now; may switch to heparin as above, may go home on plavix DOAC if NSTEMI  Overall a conservative strategy is recommended   For questions or updates, please contact [facility] Please consult [email or web address] for contact info under Cardiology/STEMI.   Riley Lam, MD FASE [facility] Cardiologist [facility]  [facility]  [address] [place], [address] [place], [place] [address] [phone]  8:16 PM

## 2022-09-27 NOTE — Assessment & Plan Note (Signed)
Ob tain anemia panel 

## 2022-09-27 NOTE — Assessment & Plan Note (Signed)
Platelets now down to 52.  For tonight we will hold off on Eliquis we will continue to monitor platelets to see if would be safe to resume.  Hold off on heparin for tonight patient currently chest pain-free

## 2022-09-27 NOTE — ED Provider Triage Note (Signed)
Emergency Medicine Provider Triage Evaluation Note  Evan [name] , a 89 y.o. male hx of CAD sp CABG and dementia was evaluated in triage.  Pt complains of chest pain and shortness of breath that started after taking a nap when he awoke at 4pm. Given nitro at 430 pm with improvement of his pain. Not given aspirin.   Review of Systems  Positive: Chest pain, sob, dizziness Negative: diaphoresis  Physical Exam  BP (!) 111/99 (BP Location: Right Arm)   Pulse 94   Temp 97.6 F (36.4 C) (Oral)   Resp 17   SpO2 96%  Gen:   Awake, no distress  Resp:  Normal effort  MSK:   Moves extremities without difficulty   Medical Decision Making  Medically screening exam initiated at 5:43 PM.  Appropriate orders placed.  Evan [name] was informed that the remainder of the evaluation will be completed by another provider, this initial triage assessment does not replace that evaluation, and the importance of remaining in the ED until their evaluation is complete.  Talked to charge nurse to bring back to room immediately 2/2 concerning EKG changes.    Evan Baton, MD 09/27/22 [phone]

## 2022-09-27 NOTE — Assessment & Plan Note (Signed)
Chronic but continues to progress patient probably would benefit from outpatient workup with hematology oncology

## 2022-09-27 NOTE — Assessment & Plan Note (Signed)
Stable Chronic monitor for any sundowning

## 2022-09-27 NOTE — Assessment & Plan Note (Signed)
Chronic stable hold off on Lasix for tonight given tachycardia monitor renal function

## 2022-09-27 NOTE — Assessment & Plan Note (Signed)
Chronic stable not contributing to current symptoms will restart home medications

## 2022-09-27 NOTE — Assessment & Plan Note (Signed)
-  chronic avoid nephrotoxic medications such as NSAIDs, Vanco Zosyn combo,  avoid hypotension, continue to follow renal function  

## 2022-09-27 NOTE — Subjective & Objective (Signed)
Presents with CP non radiating associated with SOB prior hx of CAD sp CABG Started at 4 pm after taking a nap Improved with nitroglycerin Hx of DVT/PE on eliquis

## 2022-09-28 ENCOUNTER — Observation Stay (HOSPITAL_COMMUNITY)

## 2022-09-28 DIAGNOSIS — I4819 Other persistent atrial fibrillation: Secondary | ICD-10-CM | POA: Diagnosis not present

## 2022-09-28 DIAGNOSIS — J449 Chronic obstructive pulmonary disease, unspecified: Secondary | ICD-10-CM | POA: Diagnosis not present

## 2022-09-28 DIAGNOSIS — I214 Non-ST elevation (NSTEMI) myocardial infarction: Principal | ICD-10-CM

## 2022-09-28 DIAGNOSIS — I2510 Atherosclerotic heart disease of native coronary artery without angina pectoris: Secondary | ICD-10-CM | POA: Diagnosis not present

## 2022-09-28 DIAGNOSIS — Z86711 Personal history of pulmonary embolism: Secondary | ICD-10-CM | POA: Diagnosis not present

## 2022-09-28 DIAGNOSIS — R0602 Shortness of breath: Secondary | ICD-10-CM | POA: Diagnosis not present

## 2022-09-28 DIAGNOSIS — R079 Chest pain, unspecified: Secondary | ICD-10-CM | POA: Diagnosis not present

## 2022-09-28 DIAGNOSIS — I517 Cardiomegaly: Secondary | ICD-10-CM | POA: Diagnosis not present

## 2022-09-28 DIAGNOSIS — Z951 Presence of aortocoronary bypass graft: Secondary | ICD-10-CM | POA: Diagnosis not present

## 2022-09-28 DIAGNOSIS — R0989 Other specified symptoms and signs involving the circulatory and respiratory systems: Secondary | ICD-10-CM | POA: Diagnosis not present

## 2022-09-28 DIAGNOSIS — I5022 Chronic systolic (congestive) heart failure: Secondary | ICD-10-CM | POA: Diagnosis not present

## 2022-09-28 DIAGNOSIS — N1831 Chronic kidney disease, stage 3a: Secondary | ICD-10-CM | POA: Diagnosis not present

## 2022-09-28 DIAGNOSIS — E876 Hypokalemia: Secondary | ICD-10-CM | POA: Diagnosis not present

## 2022-09-28 LAB — COMPREHENSIVE METABOLIC PANEL
ALT: 13 U/L (ref 0–44)
AST: 20 U/L (ref 15–41)
Albumin: 3.1 g/dL — ABNORMAL LOW (ref 3.5–5.0)
Alkaline Phosphatase: 58 U/L (ref 38–126)
Anion gap: 13 (ref 5–15)
BUN: 32 mg/dL — ABNORMAL HIGH (ref 8–23)
CO2: 20 mmol/L — ABNORMAL LOW (ref 22–32)
Calcium: 8.9 mg/dL (ref 8.9–10.3)
Chloride: 103 mmol/L (ref 98–111)
Creatinine, Ser: 1.55 mg/dL — ABNORMAL HIGH (ref 0.61–1.24)
GFR, Estimated: 41 mL/min — ABNORMAL LOW (ref >60)
Glucose, Bld: 127 mg/dL — ABNORMAL HIGH (ref 70–99)
Potassium: 3.3 mmol/L — ABNORMAL LOW (ref 3.5–5.1)
Sodium: 136 mmol/L (ref 135–145)
Total Bilirubin: 0.8 mg/dL (ref 0.3–1.2)
Total Protein: 6.1 g/dL — ABNORMAL LOW (ref 6.5–8.1)

## 2022-09-28 LAB — MAGNESIUM: Magnesium: 2 mg/dL (ref 1.7–2.4)

## 2022-09-28 LAB — PREALBUMIN: Prealbumin: 18 mg/dL (ref 18–38)

## 2022-09-28 LAB — LIPID PANEL
Cholesterol: 111 mg/dL (ref 0–200)
HDL: 40 mg/dL — ABNORMAL LOW (ref >40)
LDL Cholesterol: 56 mg/dL (ref 0–99)
Total CHOL/HDL Ratio: 2.8 RATIO
Triglycerides: 75 mg/dL (ref <150)
VLDL: 15 mg/dL (ref 0–40)

## 2022-09-28 LAB — CBC
HCT: 29.4 % — ABNORMAL LOW (ref 39.0–52.0)
Hemoglobin: 9.9 g/dL — ABNORMAL LOW (ref 13.0–17.0)
MCH: 32 pg (ref 26.0–34.0)
MCHC: 33.7 g/dL (ref 30.0–36.0)
MCV: 95.1 fL (ref 80.0–100.0)
Platelets: 50 10*3/uL — ABNORMAL LOW (ref 150–400)
RBC: 3.09 MIL/uL — ABNORMAL LOW (ref 4.22–5.81)
RDW: 14.5 % (ref 11.5–15.5)
WBC: 6.3 10*3/uL (ref 4.0–10.5)
nRBC: 0 % (ref 0.0–0.2)

## 2022-09-28 LAB — TROPONIN I (HIGH SENSITIVITY): Troponin I (High Sensitivity): 23 ng/L — ABNORMAL HIGH (ref <18)

## 2022-09-28 LAB — PHOSPHORUS: Phosphorus: 3.6 mg/dL (ref 2.5–4.6)

## 2022-09-28 LAB — ECHOCARDIOGRAM COMPLETE: Weight: 2243.4 oz

## 2022-09-28 LAB — MRSA NEXT GEN BY PCR, NASAL: MRSA by PCR Next Gen: DETECTED — AB

## 2022-09-28 MED ORDER — FLUOXETINE HCL 20 MG PO CAPS
20.0000 mg | ORAL_CAPSULE | Freq: Every day | ORAL | Status: DC
  Administered 2022-09-28 – 2022-09-29 (×2): 20 mg via ORAL
  Filled 2022-09-28 (×2): qty 1

## 2022-09-28 MED ORDER — FERROUS SULFATE 325 (65 FE) MG PO TABS
325.0000 mg | ORAL_TABLET | ORAL | Status: DC
  Administered 2022-09-28: 325 mg via ORAL
  Filled 2022-09-28: qty 1

## 2022-09-28 MED ORDER — FUROSEMIDE 10 MG/ML IJ SOLN
20.0000 mg | Freq: Once | INTRAMUSCULAR | Status: AC
  Administered 2022-09-28: 20 mg via INTRAVENOUS
  Filled 2022-09-28: qty 2

## 2022-09-28 MED ORDER — METOPROLOL TARTRATE 12.5 MG HALF TABLET
12.5000 mg | ORAL_TABLET | Freq: Two times a day (BID) | ORAL | Status: DC
  Administered 2022-09-28 – 2022-09-29 (×2): 12.5 mg via ORAL
  Filled 2022-09-28 (×3): qty 1

## 2022-09-28 MED ORDER — METOPROLOL TARTRATE 5 MG/5ML IV SOLN: 5.0000 mg | INTRAVENOUS | Status: DC | PRN

## 2022-09-28 MED ORDER — TRAZODONE HCL 50 MG PO TABS
50.0000 mg | ORAL_TABLET | Freq: Every evening | ORAL | Status: DC | PRN
  Administered 2022-09-28: 50 mg via ORAL
  Filled 2022-09-28: qty 1

## 2022-09-28 MED ORDER — GUAIFENESIN 100 MG/5ML PO LIQD: 5.0000 mL | ORAL | Status: DC | PRN

## 2022-09-28 MED ORDER — CHLORHEXIDINE GLUCONATE CLOTH 2 % EX PADS
6.0000 | MEDICATED_PAD | Freq: Every day | CUTANEOUS | Status: DC
  Administered 2022-09-28: 6 via TOPICAL

## 2022-09-28 MED ORDER — POTASSIUM CHLORIDE CRYS ER 20 MEQ PO TBCR
40.0000 meq | EXTENDED_RELEASE_TABLET | Freq: Once | ORAL | Status: AC
  Administered 2022-09-28: 40 meq via ORAL
  Filled 2022-09-28: qty 2

## 2022-09-28 MED ORDER — FOLIC ACID 1 MG PO TABS
1.0000 mg | ORAL_TABLET | Freq: Every day | ORAL | Status: DC
  Administered 2022-09-28 – 2022-09-29 (×2): 1 mg via ORAL
  Filled 2022-09-28 (×2): qty 1

## 2022-09-28 MED ORDER — ONDANSETRON HCL 4 MG/2ML IJ SOLN: 4.0000 mg | Freq: Four times a day (QID) | INTRAMUSCULAR | Status: DC | PRN

## 2022-09-28 MED ORDER — CHLORHEXIDINE GLUCONATE CLOTH 2 % EX PADS
6.0000 | MEDICATED_PAD | Freq: Every day | CUTANEOUS | Status: DC
  Administered 2022-09-28 – 2022-09-29 (×2): 6 via TOPICAL

## 2022-09-28 MED ORDER — HYDRALAZINE HCL 20 MG/ML IJ SOLN: 10.0000 mg | INTRAMUSCULAR | Status: DC | PRN

## 2022-09-28 MED ORDER — SENNOSIDES-DOCUSATE SODIUM 8.6-50 MG PO TABS: 1.0000 | ORAL_TABLET | Freq: Every evening | ORAL | Status: DC | PRN

## 2022-09-28 MED ORDER — APIXABAN 2.5 MG PO TABS
2.5000 mg | ORAL_TABLET | Freq: Two times a day (BID) | ORAL | Status: DC
  Administered 2022-09-28 – 2022-09-29 (×3): 2.5 mg via ORAL
  Filled 2022-09-28 (×3): qty 1

## 2022-09-28 NOTE — Progress Notes (Signed)
Attempted Echocardiogram, patient is not in the bed and wants to eat.

## 2022-09-28 NOTE — Evaluation (Signed)
Occupational Therapy Evaluation Patient Details Name: Evan [name] MRN: [id number] DOB: [birth year] Today's Date: 09/28/2022   History of Present Illness 89 y.o. male presented to ED with PMH: CAD sp CABG, systolic  CHF EF 78-29% BPH, esophageal ulcer, GERD, HLD, HTN RA, hx of subdural hematoma in the past, HOH, thrombocytopenia   Clinical Impression   Pt needs assist at baseline for ADLs/mobility, uses upright RW. Has family present 24/7 to assist. Pt needing min-max A for ADLs, mod A +2 for bed mobility and mod A +2 for transfers using eva walker. Pt with ataxic-like movements in RUE at baseline from prior CVA, needing assist for bilateral tasks. Pt presenting with impairments listed below, will follow acutely. Pt with hospice services at home, anticipate no OT follow up needs at d/c.      Recommendations for follow up therapy are one component of a multi-disciplinary discharge planning process, led by the attending physician.  Recommendations may be updated based on patient status, additional functional criteria and insurance authorization.   Assistance Recommended at Discharge Frequent or constant Supervision/Assistance  Patient can return home with the following Two people to help with walking and/or transfers;A lot of help with bathing/dressing/bathroom;Direct supervision/assist for medications management;Direct supervision/assist for financial management;Assist for transportation;Help with stairs or ramp for entrance    Functional Status Assessment  Patient has had a recent decline in their functional status and demonstrates the ability to make significant improvements in function in a reasonable and predictable amount of time.  Equipment Recommendations  None recommended by OT    Recommendations for Other Services PT consult     Precautions / Restrictions Precautions Precautions: Fall Precaution Comments: distant hx fall Restrictions Weight Bearing Restrictions: No       Mobility Bed Mobility Overal bed mobility: Needs Assistance Bed Mobility: Sidelying to Sit   Sidelying to sit: +2 for physical assistance, Mod assist            Transfers Overall transfer level: Needs assistance Equipment used:  (eva walker) Transfers: Sit to/from Stand Sit to Stand: Mod assist, +2 physical assistance                  Balance Overall balance assessment: Needs assistance Sitting-balance support: Feet supported Sitting balance-[name] Scale: Good     Standing balance support: During functional activity Standing balance-[name] Scale: Poor Standing balance comment: reliant on external support                           ADL either performed or assessed with clinical judgement   ADL Overall ADL's : Needs assistance/impaired Eating/Feeding: Minimal assistance;Sitting   Grooming: Minimal assistance;Sitting   Upper Body Bathing: Moderate assistance;Sitting   Lower Body Bathing: Maximal assistance;Sitting/lateral leans   Upper Body Dressing : Moderate assistance;Sitting   Lower Body Dressing: Maximal assistance;Sitting/lateral leans   Toilet Transfer: Moderate assistance;+2 for physical assistance;Minimal assistance   Toileting- Clothing Manipulation and Hygiene: Total assistance Toileting - Clothing Manipulation Details (indicate cue type and reason): catheter     Functional mobility during ADLs: Minimal assistance;Moderate assistance;+2 for physical assistance       Vision Baseline Vision/History: 1 Wears glasses Vision Assessment?: No apparent visual deficits     Perception Perception Perception Tested?: No   Praxis Praxis Praxis tested?: Not tested    Pertinent Vitals/Pain Pain Assessment Pain Assessment: No/denies pain     Hand Dominance Right   Extremity/Trunk Assessment Upper Extremity Assessment Upper Extremity Assessment: Generalized weakness;RUE  deficits/detail RUE Deficits / Details: ataxic, poor grasp,  deficits from prior CVA   Lower Extremity Assessment Lower Extremity Assessment: Defer to PT evaluation   Cervical / Trunk Assessment Cervical / Trunk Assessment: Kyphotic   Communication Communication Communication: HOH   Cognition Arousal/Alertness: Awake/alert Behavior During Therapy: WFL for tasks assessed/performed Overall Cognitive Status: Within Functional Limits for tasks assessed                                       General Comments  VSS on RA    Exercises     Shoulder Instructions      Home Living Family/patient expects to be discharged to:: Private residence Living Arrangements: Children (provide 24 hour care) Available Help at Discharge: Family;Available 24 hours/day Type of Home: House Home Access: Ramped entrance     Home Layout: Two level;Able to live on main level with bedroom/bathroom     Bathroom Shower/Tub: Walk-in shower (lip)   Bathroom Toilet: Handicapped height (uses BSC mostly)     Home Equipment: Shower seat;Grab bars - tub/shower;Hospital bed;Wheelchair - manual;Hand held shower head (bilateral platform walker)          Prior Functioning/Environment Prior Level of Function : Needs assist             Mobility Comments: walks from bed to wheelchair with walker, family pushes  wheelchair to table, walks in hallway with walker 1-2x/day ADLs Comments: 3x week bathing through, hospice, family provides for ADLs and iADLs, able to feed himself        OT Problem List: Decreased strength;Decreased range of motion;Decreased activity tolerance;Impaired balance (sitting and/or standing);Decreased safety awareness      OT Treatment/Interventions: Self-care/ADL training;Therapeutic exercise;Energy conservation;DME and/or AE instruction;Therapeutic activities;Patient/family education;Balance training    OT Goals(Current goals can be found in the care plan section) Acute Rehab OT Goals Patient Stated Goal: none stated OT Goal  Formulation: With patient Time For Goal Achievement: 10/12/22 Potential to Achieve Goals: Good ADL Goals Pt Will Perform Upper Body Dressing: with supervision;sitting Pt Will Perform Lower Body Dressing: with supervision;sitting/lateral leans;sit to/from stand Pt Will Transfer to Toilet: with supervision;ambulating;regular height toilet  OT Frequency: Min 1X/week    Co-evaluation              AM-PAC OT "6 Clicks" Daily Activity     Outcome Measure Help from another person eating meals?: A Little Help from another person taking care of personal grooming?: A Little Help from another person toileting, which includes using toliet, bedpan, or urinal?: A Lot Help from another person bathing (including washing, rinsing, drying)?: A Lot Help from another person to put on and taking off regular upper body clothing?: A Lot Help from another person to put on and taking off regular lower body clothing?: A Lot 6 Click Score: 14   End of Session Equipment Utilized During Treatment: Gait belt;Rolling walker (2 wheels) Nurse Communication: Mobility status  Activity Tolerance: Patient tolerated treatment well Patient left: with call bell/phone within reach;in chair;with chair alarm set;with family/visitor present  OT Visit Diagnosis: Unsteadiness on feet (R26.81);Other abnormalities of gait and mobility (R26.89);Muscle weakness (generalized) (M62.81)                Time: 7829-5621 OT Time Calculation (min): 31 min Charges:  OT General Charges $OT Visit: 1 Visit OT Evaluation $OT Eval Moderate Complexity: 1 Mod  Antigone Crowell K, OTD, OTR/L SecureChat Preferred  Acute Rehab [phone]   Dalphine Handing 09/28/2022, 10:37 AM

## 2022-09-28 NOTE — Progress Notes (Signed)
09/28/2022 0200 Received pt to room 2C-17 from ED with DX of NSTEMI.  Pt is A&O, no C/O at this time.  Tele monitor applied and CCMD notified.  CHG bath given.  Oriented to room, call light and bed.  Call bell in reach, son at bedside. Kathryne Hitch

## 2022-09-28 NOTE — Progress Notes (Signed)
PROGRESS NOTE    Evan [name]  EAV:[id number] DOB: 01/26/1928 DOA: 09/27/2022 PCP: Donita Brooks, MD   Brief Narrative:  89 year old with history of CAD status post CABG, systolic CHF, EF 78%, BPH, esophageal ulcer, GERD, HLD, HTN, DVT/PE on Eliquis, rheumatoid arthritis, history of some dural hematoma, thrombocytopenia admitted for chest pain.  Upon admission seen by cardiology, troponins remain flat, Imdur was added.   Assessment & Plan:  Principal Problem:   NSTEMI (non-ST elevated myocardial infarction) (HCC) Active Problems:   HFmrEF (heart failure with mildly reduced ejection fraction) (HCC)   Chronic respiratory failure, unsp w hypoxia or hypercapnia (HCC)   History of pulmonary embolism   Persistent atrial fibrillation (HCC)   COPD (chronic obstructive pulmonary disease) (HCC)   CKD (chronic kidney disease), stage III (HCC)   Thrombocytopenia (HCC)   Dementia without behavioral disturbance (HCC)   Anemia     Chest pain/NSTEMI History of CAD status post CABG in 1998 - Plan to trend troponin for now, they are relatively flat.  Check A1c, lipid panel -Imdur added -Further management per cardiology team.  Hypokalemia - As needed repletion  Chronic systolic congestive heart failure, EF 45% - Overall euvolemic  CKD stage IIIa - Baseline creatinine 1.4, around baseline right now  History of DVT/PE - On Eliquis  Persistent atrial fibrillation - On Eliquis  BPH - Proscar  Anemia of chronic disease - Hemoglobin around baseline  Dementia - Supportive care    DVT prophylaxis: ELiquis Code Status: Full Family Communication: Children at bedside  Discharge once cleared by cardiology and PT/OT evaluation.      Diet Orders (From admission, onward)     Start     Ordered   09/27/22 2350  Diet Heart Room service appropriate? Yes; Fluid consistency: Thin  Diet effective now       Question Answer Comment  Room service appropriate? Yes   Fluid  consistency: Thin      09/27/22 2349            Subjective: Patient seen at bedside.  He is very hard of hearing.  Children present at bedside.  Currently does not have any complaints including chest pain.   Examination:  General exam: Appears calm and comfortable, elderly frail.  Very hard of hearing. Respiratory system: Clear to auscultation. Respiratory effort normal. Cardiovascular system: S1 & S2 heard, RRR. No JVD, murmurs, rubs, gallops or clicks. No pedal edema. Gastrointestinal system: Abdomen is nondistended, soft and nontender. No organomegaly or masses felt. Normal bowel sounds heard. Central nervous system: Alert and oriented. No focal neurological deficits. Extremities: Symmetric 5 x 5 power. Skin: No rashes, lesions or ulcers Psychiatry: Judgement and insight appear normal. Mood & affect appropriate.  Objective: Vitals:   09/27/22 2330 09/28/22 0100 09/28/22 0204 09/28/22 0420  BP: 106/64 113/61 (!) 107/57 (!) 98/58  Pulse: 86 67 69 66  Resp: 16 (!) 27 20 20   Temp:   98.2 F (36.8 C) 97.6 F (36.4 C)  TempSrc:   Oral Oral  SpO2: 100% 96% 97% 94%  Weight:   63.6 kg     Intake/Output Summary (Last 24 hours) at 09/28/2022 0732 Last data filed at 09/28/2022 2956 Gross per 24 hour  Intake 3 ml  Output 400 ml  Net -397 ml   Filed Weights   09/28/22 0204  Weight: 63.6 kg    Scheduled Meds:  Chlorhexidine Gluconate Cloth  6 each Topical Daily   finasteride  5 mg Oral Daily  isosorbide mononitrate  30 mg Oral Daily   mometasone-formoterol  2 puff Inhalation BID   pantoprazole  40 mg Oral q morning   sodium chloride flush  3 mL Intravenous Q12H   tamsulosin  0.4 mg Oral Q supper   Continuous Infusions:  sodium chloride      Nutritional status     Body mass index is 24.84 kg/m.  Data Reviewed:   CBC: Recent Labs  Lab 09/27/22 1731 09/27/22 2006 09/28/22 0230  WBC 7.7 6.9 6.3  NEUTROABS  --  4.6  --   HGB 10.5* 10.1* 9.9*  HCT 32.4*  29.6* 29.4*  MCV 96.7 94.6 95.1  PLT 53* 52* 50*   Basic Metabolic Panel: Recent Labs  Lab 09/27/22 1731 09/27/22 2006 09/28/22 0230  NA 136  --  136  K 3.7  --  3.3*  CL 102  --  103  CO2 22  --  20*  GLUCOSE 115*  --  127*  BUN 29*  --  32*  CREATININE 1.29*  --  1.55*  CALCIUM 9.3  --  8.9  MG  --  1.8 2.0  PHOS  --  2.8 3.6   GFR: Estimated Creatinine Clearance: 24 mL/min (A) (by C-G formula based on SCr of 1.55 mg/dL (H)). Liver Function Tests: Recent Labs  Lab 09/27/22 2006 09/28/22 0230  AST 20 20  ALT 11 13  ALKPHOS 60 58  BILITOT 0.8 0.8  PROT 6.1* 6.1*  ALBUMIN 3.0* 3.1*   No results for input(s): "LIPASE", "AMYLASE" in the last 168 hours. No results for input(s): "AMMONIA" in the last 168 hours. Coagulation Profile: Recent Labs  Lab 09/27/22 2018  INR 1.5*   Cardiac Enzymes: Recent Labs  Lab 09/27/22 2006  CKTOTAL 30*   BNP (last 3 results) No results for input(s): "PROBNP" in the last 8760 hours. HbA1C: No results for input(s): "HGBA1C" in the last 72 hours. CBG: No results for input(s): "GLUCAP" in the last 168 hours. Lipid Profile: No results for input(s): "CHOL", "HDL", "LDLCALC", "TRIG", "CHOLHDL", "LDLDIRECT" in the last 72 hours. Thyroid Function Tests: Recent Labs    09/27/22 2006  TSH 2.480   Anemia Panel: Recent Labs    09/27/22 2006 09/27/22 2018  VITAMINB12  --  641  FOLATE  --  >40.0  FERRITIN  --  88  TIBC  --  262  IRON  --  71  RETICCTPCT 1.6  --    Sepsis Labs: No results for input(s): "PROCALCITON", "LATICACIDVEN" in the last 168 hours.  Recent Results (from the past 240 hour(s))  MRSA Next Gen by PCR, Nasal     Status: Abnormal   Collection Time: 09/28/22  1:47 AM   Specimen: Nasal Mucosa; Nasal Swab  Result Value Ref Range Status   MRSA by PCR Next Gen DETECTED (A) NOT DETECTED Final    Comment: RESULTS CALLED TO, READ BACK BY AND VERIFIED WITH RN K.ISLAND ON 09/28/22 AT 0508 BY NM (NOTE) The GeneXpert  MRSA Assay (FDA approved for NASAL specimens only), is one component of a comprehensive MRSA colonization surveillance program. It is not intended to diagnose MRSA infection nor to guide or monitor treatment for MRSA infections. Test performance is not FDA approved in patients less than 23 years old. Performed at [facility], [address], [place], [place] [address]          Radiology Studies: DG Chest Portable 1 View  Result Date: 09/27/2022 CLINICAL DATA:  Chest  pain. EXAM: PORTABLE CHEST 1 VIEW COMPARISON:  May 31, 2022. FINDINGS: Stable cardiomediastinal silhouette. Sternotomy wires are noted. Hypoinflation of the lungs is noted with minimal bibasilar subsegmental atelectasis. Bony thorax is unremarkable. IMPRESSION: Hypoinflation of the lungs with minimal bibasilar subsegmental atelectasis. Electronically Signed   By: Lupita Raider M.D.   On: 09/27/2022 18:26           LOS: 0 days   Time spent= 35 mins    Shemekia Patane Joline Maxcy, MD [facility]  If 7PM-7AM, please contact night-coverage  09/28/2022, 7:32 AM

## 2022-09-28 NOTE — Evaluation (Signed)
Physical Therapy Evaluation Patient Details Name: Evan [name] MRN: [id number] DOB: 1927/05/26 Today's Date: 09/28/2022  History of Present Illness  89 y.o. male presented to ED with PMH: CAD sp CABG, systolic  CHF EF 40-98% BPH, esophageal ulcer, GERD, HLD, HTN RA, hx of subdural hematoma in the past, HOH, thrombocytopenia  Clinical Impression  Pt HoH and son in room provides home set up and PLOF information. Pt lives in his home, but children rotate staying with him 24 hours a day. Pt needs assistance with ambulation for exercise in home with RW, otherwise is pushed in wheelchair. Family provides for ADLs and iADLs. Pt is currently being cared for by Hospice and they provide bathing 3x/week. 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 Pt requires modAx2 for bed mobility and transfers and minAx2 for ambulation with close chair follow. Pt family provides daily exercise in way of ambulation with RW and ROM/strengthening exercises, so will not need additional PT services at discharge. PT will continue to follow acutely for strengthening while in hospital.        Recommendations for follow up therapy are one component of a multi-disciplinary discharge planning process, led by the attending physician.  Recommendations may be updated based on patient status, additional functional criteria and insurance authorization.     Assistance Recommended at Discharge Frequent or constant Supervision/Assistance  Patient can return home with the following  Two people to help with walking and/or transfers;Assistance with cooking/housework;A lot of help with bathing/dressing/bathroom;Direct supervision/assist for medications management;Direct supervision/assist for financial management;Assist for transportation;Help with stairs or ramp for entrance    Equipment Recommendations None recommended by PT  Recommendations for Other Services        Functional Status Assessment Patient has had a recent decline in their functional status and demonstrates the ability to make significant improvements in function in a reasonable and predictable amount of time.     Precautions / Restrictions Precautions Precautions: Fall Precaution Comments: distant hx fall Restrictions Weight Bearing Restrictions: No      Mobility  Bed Mobility Overal bed mobility: Needs Assistance Bed Mobility: Sidelying to Sit   Sidelying to sit: +2 for physical assistance, Mod assist            Transfers Overall transfer level: Needs assistance Equipment used:  (eva walker) Transfers: Sit to/from Stand Sit to Stand: Mod assist, +2 physical assistance                Ambulation/Gait Ambulation/Gait assistance: Min assist, +2 safety/equipment Gait Distance (Feet): 40 Feet Assistive device: Fara Boros Gait Pattern/deviations: Steppage, Ataxic, Wide base of support, Decreased weight shift to right, Decreased dorsiflexion - right Gait velocity: slowed Gait velocity interpretation: <1.31 ft/sec, indicative of household ambulator   General Gait Details: min A for steadying with EVA walker, and assist for steering in hallway, close chair follow as pt accustom to at home      Balance Overall balance assessment: Needs assistance Sitting-balance support: Feet supported Sitting balance-[name] Scale: Good     Standing balance support: During functional activity Standing balance-[name] Scale: Poor Standing balance comment: reliant on external support                             Pertinent Vitals/Pain Pain Assessment Pain Assessment: No/denies pain    Home Living Family/patient expects to be discharged to:: Private residence Living Arrangements: Children (provide  24 hour care) Available Help at Discharge: Family;Available 24 hours/day Type of Home: House Home Access: Ramped entrance       Home Layout: Two level;Able to live on  main level with bedroom/bathroom Home Equipment: Shower seat;Grab bars - tub/shower;Hospital bed;Wheelchair - manual;Hand held shower head (bilateral platform walker)      Prior Function Prior Level of Function : Needs assist             Mobility Comments: walks from bed to wheelchair with walker, family pushes  wheelchair to table, walks in hallway with walker 1-2x/day ADLs Comments: 3x week bathing through, hospice, family provides for ADLs and iADLs, able to feed himself     Hand Dominance   Dominant Hand: Right    Extremity/Trunk Assessment   Upper Extremity Assessment Upper Extremity Assessment: Defer to OT evaluation RUE Deficits / Details: ataxic, poor grasp, deficits from prior CVA    Lower Extremity Assessment Lower Extremity Assessment: Generalized weakness;RLE deficits/detail RLE Deficits / Details: ataxic, requires AFO for decreased dorsiflexion RLE Coordination: decreased fine motor;decreased gross motor    Cervical / Trunk Assessment Cervical / Trunk Assessment: Kyphotic  Communication   Communication: HOH  Cognition Arousal/Alertness: Awake/alert Behavior During Therapy: WFL for tasks assessed/performed Overall Cognitive Status: Within Functional Limits for tasks assessed                                          General Comments General comments (skin integrity, edema, etc.): VSS on RA        Assessment/Plan    PT Assessment Patient needs continued PT services  PT Problem List Decreased coordination;Decreased balance;Decreased activity tolerance;Decreased strength;Impaired tone       PT Treatment Interventions DME instruction;Gait training;Functional mobility training;Therapeutic activities;Therapeutic exercise;Balance training;Cognitive remediation;Patient/family education    PT Goals (Current goals can be found in the Care Plan section)  Acute Rehab PT Goals PT Goal Formulation: With patient/family Time For Goal  Achievement: 10/12/22 Potential to Achieve Goals: Fair    Frequency Min 1X/week        AM-PAC PT "6 Clicks" Mobility  Outcome Measure Help needed turning from your back to your side while in a flat bed without using bedrails?: Total Help needed moving from lying on your back to sitting on the side of a flat bed without using bedrails?: Total Help needed moving to and from a bed to a chair (including a wheelchair)?: Total Help needed standing up from a chair using your arms (e.g., wheelchair or bedside chair)?: Total Help needed to walk in hospital room?: Total Help needed climbing 3-5 steps with a railing? : Total 6 Click Score: 6    End of Session Equipment Utilized During Treatment: Gait belt Activity Tolerance: Patient tolerated treatment well Patient left: in chair;with call bell/phone within reach;with chair alarm set Nurse Communication: Mobility status PT Visit Diagnosis: Muscle weakness (generalized) (M62.81);Difficulty in walking, not elsewhere classified (R26.2);Hemiplegia and hemiparesis Hemiplegia - Right/Left: Right Hemiplegia - dominant/non-dominant: Dominant Hemiplegia - caused by: Cerebral infarction    Time: 1308-6578 PT Time Calculation (min) (ACUTE ONLY): 30 min   Charges:   PT Evaluation $PT Eval Moderate Complexity: 1 Mod          Rebbecca Osuna B. Beverely Risen PT, DPT Acute Rehabilitation Services Please use secure chat or  Call Office [phone]   Elon Alas [facility], [place] 09/28/2022, 11:16 AM

## 2022-09-28 NOTE — Care Management Obs Status (Signed)
MEDICARE OBSERVATION STATUS NOTIFICATION   Patient Details  Name: Evan [name] MRN: [id number] Date of Birth: October 06, 1927   Medicare Observation Status Notification Given:  Yes    Harriet Masson, RN 09/28/2022, 1:49 PM

## 2022-09-28 NOTE — ED Notes (Signed)
ED TO INPATIENT HANDOFF REPORT  ED Nurse Name and Phone #: Venita Sheffield RN   S Name/Age/Gender Evan [name] 89 y.o. male Room/Bed: 016C/016C  Code Status   Code Status: Full Code  Home/SNF/Other Home Patient oriented to: self, place, time, and situation Is this baseline? Yes   Triage Complete: Triage complete  Chief Complaint NSTEMI (non-ST elevated myocardial infarction) [facility]) [I21.4]  Triage Note Pt reports waking up at 4pm today and having CP in his left chest, non radiating.  States he feels shob as well.    Allergies Allergies  Allergen Reactions   Neomycin-Polymyxin-Hc Itching   Piroxicam Rash and Other (See Comments)    Blistering rash     Statins Other (See Comments)    Myalgias   Diazepam Other (See Comments)    Dizziness  REACTION:  unknown, REACTION:  unknown   Doxycycline Nausea Only, Itching and Other (See Comments)    REACTION:  unknown, REACTION:  unknown   Gatifloxacin Anxiety and Swelling   Latex Rash    Band Aid    Level of Care/Admitting Diagnosis ED Disposition     ED Disposition  Admit   Condition  --   Comment  Hospital Area: [facility] [[id number]]  Level of Care: Progressive [102]  Admit to Progressive based on following criteria: CARDIOVASCULAR & THORACIC of moderate stability with acute coronary syndrome symptoms/low risk myocardial infarction/hypertensive urgency/arrhythmias/heart failure potentially compromising stability and stable post cardiovascular intervention patients.  May place patient in observation at [facility] or [facility] if equivalent level of care is available:: No  Covid Evaluation: Asymptomatic - no recent exposure (last 10 days) testing not required  Diagnosis: NSTEMI (non-ST elevated myocardial infarction) [facility]) [[id number]]  Admitting Physician: Therisa Doyne [[id number]]  Attending Physician: Therisa Doyne [[id number]]          B Medical/Surgery History Past Medical History:   Diagnosis Date   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   Anginal chest pain at rest    Chronic non, controlled on Lorazepam   Anxiety    Arthritis    "all over"    BPH (benign prostatic hyperplasia)    CAD (coronary artery disease)    Chronic lower back pain    Complication of anesthesia    "problems making water afterwards"   Depression    DJD (degenerative joint disease)    Esophageal ulcer without bleeding    bid ppi indefinitely   Family history of adverse reaction to anesthesia    "children w/PONV"   GERD (gastroesophageal reflux disease)    Headache    "couple/week maybe" (09/04/2015)   Hemochromatosis    Possible, elevated iron stores, hook-like osteophytes on hand films, normal LFTs   Hepatitis    "yellow jaundice as a baby"   History of ASCVD    MULTIVESSEL   Hyperlipidemia    Hypertension    Mild aortic stenosis 06/23/2021   Osteoarthritis    Pneumonia    "several times; got a little now" (09/04/2015)   Rheumatoid arthritis (HCC)    Subdural hematoma (HCC)    small after fall 09/2016-plavix held, neurosurgery consulted.  Asymptomatic.     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 he failed PO Eliquis   Past Surgical History:  Procedure Laterality Date   ARM SKIN LESION BIOPSY / EXCISION Left 09/02/2015   CATARACT EXTRACTION W/ INTRAOCULAR LENS  IMPLANT, BILATERAL Bilateral    CHOLECYSTECTOMY OPEN     CORNEAL TRANSPLANT Bilateral    "  [facility]; one at [facility]"   CORONARY ANGIOPLASTY WITH STENT PLACEMENT     CORONARY ARTERY BYPASS GRAFT  1999   DESCENDING AORTIC ANEURYSM REPAIR W/ STENT     DE stent ostium into the right radial free graft at OM-, 02-2007   ESOPHAGOGASTRODUODENOSCOPY N/A 11/09/2014   Procedure: ESOPHAGOGASTRODUODENOSCOPY (EGD);  Surgeon: Dorena Cookey, MD;  [facility];  Service: Endoscopy;  Laterality: N/A;   INGUINAL HERNIA REPAIR     JOINT REPLACEMENT     LEFT HEART CATH AND CORS/GRAFTS ANGIOGRAPHY N/A 06/27/2017    Procedure: LEFT HEART CATH AND CORS/GRAFTS ANGIOGRAPHY;  Surgeon: Lennette Bihari, MD;  Location: [facility] INVASIVE CV LAB;  Service: Cardiovascular;  Laterality: N/A;   NASAL SINUS SURGERY     SHOULDER OPEN ROTATOR CUFF REPAIR Right    TOTAL KNEE ARTHROPLASTY Bilateral      A IV Location/Drains/Wounds Patient Lines/Drains/Airways Status     Active Line/Drains/Airways     Name Placement date Placement time Site Days   Peripheral IV 09/27/22 20 G Anterior;Left Forearm 09/27/22  1804  Forearm  1   Urethral Catheter ashley towsley RN 05/12/22  0258  --  139   Pressure Injury 08/26/18 Stage II -  Partial thickness loss of dermis presenting as a shallow open ulcer with a red, pink wound bed without slough. small circular wounds on both buttocks, 2 on right, 1 on left 08/26/18  2020  -- 1494   Pressure Injury 03/12/22 Coccyx Mid Stage 1 -  Intact skin with non-blanchable redness of a localized area usually over a bony prominence. 03/12/22  1800  -- 200   Pressure Injury 04/28/22 Sacrum Mid Stage 1 -  Intact skin with non-blanchable redness of a localized area usually over a bony prominence. 04/28/22  2030  -- 153   Wound / Incision (Open or Dehisced) 08/12/18 Other (Comment) Heel Posterior;Right Deep Tissue Injurry 08/12/18  0930  Heel  1508   Wound / Incision (Open or Dehisced) 08/12/18 Other (Comment) Ear Right Device Associated Pressure Ulcer  08/12/18  1640  Ear  1508            Intake/Output Last 24 hours No intake or output data in the 24 hours ending 09/28/22 0015  Labs/Imaging Results for orders placed or performed during the hospital encounter of 09/27/22 (from the past 48 hour(s))  Basic metabolic panel     Status: Abnormal   Collection Time: 09/27/22  5:31 PM  Result Value Ref Range   Sodium 136 135 - 145 mmol/L   Potassium 3.7 3.5 - 5.1 mmol/L   Chloride 102 98 - 111 mmol/L   CO2 22 22 - 32 mmol/L   Glucose, Bld 115 (H) 70 - 99 mg/dL    Comment: Glucose reference range  applies only to samples taken after fasting for at least 8 hours.   BUN 29 (H) 8 - 23 mg/dL   Creatinine, Ser 6.57 (H) 0.61 - 1.24 mg/dL   Calcium 9.3 8.9 - 84.6 mg/dL   GFR, Estimated 52 (L) >60 mL/min    Comment: (NOTE) Calculated using the CKD-EPI Creatinine Equation (2021)    Anion gap 12 5 - 15    Comment: Performed at [facility], [address], [place], [place] [address]  CBC     Status: Abnormal   Collection Time: 09/27/22  5:31 PM  Result Value Ref Range   WBC 7.7 4.0 - 10.5 K/uL   RBC 3.35 (L) 4.22 - 5.81  MIL/uL   Hemoglobin 10.5 (L) 13.0 - 17.0 g/dL   HCT 40.9 (L) 81.1 - 91.4 %   MCV 96.7 80.0 - 100.0 fL   MCH 31.3 26.0 - 34.0 pg   MCHC 32.4 30.0 - 36.0 g/dL   RDW 78.2 95.6 - 21.3 %   Platelets 53 (L) 150 - 400 K/uL    Comment: Immature Platelet Fraction may be clinically indicated, consider ordering this additional test [id number] REPEATED TO VERIFY PLATELET COUNT CONFIRMED BY SMEAR    nRBC 0.0 0.0 - 0.2 %    Comment: Performed at [facility], [address], [place], [place] [address]  Troponin I (High Sensitivity)     Status: Abnormal   Collection Time: 09/27/22  5:31 PM  Result Value Ref Range   Troponin I (High Sensitivity) 19 (H) <18 ng/L    Comment: (NOTE) Elevated high sensitivity troponin I (hsTnI) values and significant  changes across serial measurements may suggest ACS but many other  chronic and acute conditions are known to elevate hsTnI results.  Refer to the "Links" section for chest pain algorithms and additional  guidance. Performed at [facility], [address] [place], [place] [address]   Troponin I (High Sensitivity)     Status: Abnormal   Collection Time: 09/27/22  8:05 PM  Result Value Ref Range   Troponin I (High Sensitivity) 20 (H) <18 ng/L    Comment: (NOTE) Elevated high sensitivity troponin I (hsTnI) values and significant  changes across serial measurements may suggest ACS but many other  chronic and  acute conditions are known to elevate hsTnI results.  Refer to the "Links" section for chest pain algorithms and additional  guidance. Performed at [facility], [address], [place], [place] [address]   Brain natriuretic peptide     Status: Abnormal   Collection Time: 09/27/22  8:06 PM  Result Value Ref Range   B Natriuretic Peptide 559.6 (H) 0.0 - 100.0 pg/mL    Comment: Performed at [facility], [address] [place], [place] [address]  CK     Status: Abnormal   Collection Time: 09/27/22  8:06 PM  Result Value Ref Range   Total CK 30 (L) 49 - 397 U/L    Comment: Performed at [facility], [address] [place], [place] [address]  Magnesium     Status: None   Collection Time: 09/27/22  8:06 PM  Result Value Ref Range   Magnesium 1.8 1.7 - 2.4 mg/dL    Comment: Performed at [facility], [address], [place], [place] [address]  Phosphorus     Status: None   Collection Time: 09/27/22  8:06 PM  Result Value Ref Range   Phosphorus 2.8 2.5 - 4.6 mg/dL    Comment: Performed at [facility], [address] [place], [place] [address]  Hepatic function panel     Status: Abnormal   Collection Time: 09/27/22  8:06 PM  Result Value Ref Range   Total Protein 6.1 (L) 6.5 - 8.1 g/dL   Albumin 3.0 (L) 3.5 - 5.0 g/dL   AST 20 15 - 41 U/L   ALT 11 0 - 44 U/L   Alkaline Phosphatase 60 38 - 126 U/L   Total Bilirubin 0.8 0.3 - 1.2 mg/dL   Bilirubin, Direct 0.2 0.0 - 0.2 mg/dL   Indirect Bilirubin 0.6 0.3 - 0.9 mg/dL    Comment: Performed at [facility], [address], [place], [place] [address]  CBC with Differential/Platelet     Status: Abnormal   Collection Time: 09/27/22  8:06 PM  Result Value Ref Range   WBC 6.9 4.0 - 10.5 K/uL   RBC 3.13 (L) 4.22 - 5.81 MIL/uL   Hemoglobin 10.1 (L) 13.0 - 17.0 g/dL   HCT 25.4 (L) 27.0 - 62.3 %   MCV 94.6 80.0 - 100.0 fL   MCH 32.3 26.0 - 34.0 pg   MCHC 34.1 30.0 - 36.0 g/dL   RDW 76.2 83.1 - 51.7 %    Platelets 52 (L) 150 - 400 K/uL    Comment: Immature Platelet Fraction may be clinically indicated, consider ordering this additional test [id number] REPEATED TO VERIFY    nRBC 0.0 0.0 - 0.2 %   Neutrophils Relative % 67 %   Neutro Abs 4.6 1.7 - 7.7 K/uL   Lymphocytes Relative 24 %   Lymphs Abs 1.7 0.7 - 4.0 K/uL   Monocytes Relative 9 %   Monocytes Absolute 0.6 0.1 - 1.0 K/uL   Eosinophils Relative 0 %   Eosinophils Absolute 0.0 0.0 - 0.5 K/uL   Basophils Relative 0 %   Basophils Absolute 0.0 0.0 - 0.1 K/uL   Immature Granulocytes 0 %   Abs Immature Granulocytes 0.03 0.00 - 0.07 K/uL    Comment: Performed at [facility], [address] [place], [place] [address]  TSH     Status: None   Collection Time: 09/27/22  8:06 PM  Result Value Ref Range   TSH 2.480 0.350 - 4.500 uIU/mL    Comment: Performed by a 3rd Generation assay with a functional sensitivity of <=0.01 uIU/mL. Performed at [facility], [address], [place], [place] [address]   Reticulocytes     Status: Abnormal   Collection Time: 09/27/22  8:06 PM  Result Value Ref Range   Retic Ct Pct 1.6 0.4 - 3.1 %   RBC. 3.15 (L) 4.22 - 5.81 MIL/uL   Retic Count, Absolute 49.8 19.0 - 186.0 K/uL   Immature Retic Fract 10.3 2.3 - 15.9 %    Comment: Performed at [facility], [address] [place], [place] [address]  Vitamin B12     Status: None   Collection Time: 09/27/22  8:18 PM  Result Value Ref Range   Vitamin B-12 641 180 - 914 pg/mL    Comment: (NOTE) This assay is not validated for testing neonatal or myeloproliferative syndrome specimens for Vitamin B12 levels. Performed at [facility], [address], [place], [place] [address]   Folate     Status: None   Collection Time: 09/27/22  8:18 PM  Result Value Ref Range   Folate >40.0 >5.9 ng/mL    Comment: Performed at [facility], [address], [place], [place] [address]  Iron and TIBC     Status: None   Collection Time:  09/27/22  8:18 PM  Result Value Ref Range   Iron 71 45 - 182 ug/dL   TIBC 371 696 - 789 ug/dL   Saturation Ratios 27 17.9 - 39.5 %   UIBC 191 ug/dL    Comment: Performed at [facility], [address] [place], [place] [address]  Ferritin     Status: None   Collection Time: 09/27/22  8:18 PM  Result Value Ref Range   Ferritin 88 24 - 336 ng/mL    Comment: Performed  at [facility], [address] [place], [place] [address]  Protime-INR     Status: Abnormal   Collection Time: 09/27/22  8:18 PM  Result Value Ref Range   Prothrombin Time 18.0 (H) 11.4 - 15.2 seconds   INR 1.5 (H) 0.8 - 1.2    Comment: (NOTE) INR goal varies based on device and disease states. Performed at [facility], [address], [place], [place] [address]    *Note: Due to a large number of results and/or encounters for the requested time period, some results have not been displayed. A complete set of results can be found in Results Review.   DG Chest Portable 1 View  Result Date: 09/27/2022 CLINICAL DATA:  Chest pain. EXAM: PORTABLE CHEST 1 VIEW COMPARISON:  May 31, 2022. FINDINGS: Stable cardiomediastinal silhouette. Sternotomy wires are noted. Hypoinflation of the lungs is noted with minimal bibasilar subsegmental atelectasis. Bony thorax is unremarkable. IMPRESSION: Hypoinflation of the lungs with minimal bibasilar subsegmental atelectasis. Electronically Signed   By: Lupita Raider M.D.   On: 09/27/2022 18:26    Pending Labs Unresulted Labs (From admission, onward)     Start     Ordered   09/28/22 0500  Prealbumin  Tomorrow morning,   R        09/27/22 2008   09/28/22 0500  Magnesium  Tomorrow morning,   R        09/27/22 2350   09/28/22 0500  Phosphorus  Tomorrow morning,   R        09/27/22 2350   09/28/22 0500  Comprehensive metabolic panel  Tomorrow morning,   R       Question:  Release to patient  Answer:  Immediate   09/27/22 2350   09/28/22 0500  CBC  Tomorrow morning,    R       Question:  Release to patient  Answer:  Immediate   09/27/22 2350            Vitals/Pain Today's Vitals   09/27/22 2145 09/27/22 2325 09/27/22 2330 09/27/22 2351  BP:   106/64   Pulse: 79  86   Resp: (!) 25  16   Temp:  98 F (36.7 C)    TempSrc:  Oral    SpO2: 98%  100%   PainSc:    0-No pain    Isolation Precautions No active isolations  Medications Medications  isosorbide mononitrate (IMDUR) 24 hr tablet 30 mg (30 mg Oral Given 09/27/22 2150)  albuterol (PROVENTIL) (2.5 MG/3ML) 0.083% nebulizer solution 2.5 mg (has no administration in time range)  mometasone-formoterol (DULERA) 200-5 MCG/ACT inhaler 2 puff (2 puffs Inhalation Not Given 09/27/22 2352)  finasteride (PROSCAR) tablet 5 mg (has no administration in time range)  pantoprazole (PROTONIX) EC tablet 40 mg (has no administration in time range)  polyethylene glycol (MIRALAX / GLYCOLAX) packet 17 g (has no administration in time range)  tamsulosin (FLOMAX) capsule 0.4 mg (has no administration in time range)  acetaminophen (TYLENOL) tablet 650 mg (has no administration in time range)    Or  acetaminophen (TYLENOL) suppository 650 mg (has no administration in time range)  HYDROcodone-acetaminophen (NORCO/VICODIN) 5-325 MG per tablet 1-2 tablet (has no administration in time range)  sodium chloride flush (NS) 0.9 % injection 3 mL (has no administration in time range)  sodium chloride flush (NS) 0.9 % injection 3 mL (has no administration in time range)  0.9 %  sodium chloride infusion (has no administration  in time range)  fentaNYL (SUBLIMAZE) injection 12.5-50 mcg (has no administration in time range)  aspirin chewable tablet 324 mg (324 mg Oral Given 09/27/22 1957)    Mobility walks with device     Focused Assessments Cardiac Assessment Handoff:  Cardiac Rhythm: Atrial fibrillation Lab Results  Component Value Date   CKTOTAL 30 (L) 09/27/2022   CKMB 1.0 06/16/2014   TROPONINI <0.03 08/26/2018    Lab Results  Component Value Date   DDIMER 1.30 (H) 08/26/2018   Does the Patient currently have chest pain? No    R Recommendations: See Admitting Provider Note  Report given to:   Additional Notes:

## 2022-09-28 NOTE — Progress Notes (Signed)
Second attempt for Echocardiogram, Spoke with nurse, Patient wants to sit up in the chair for a while. Come back later.

## 2022-09-28 NOTE — TOC Initial Note (Signed)
Transition of Care [facility]) - Initial/Assessment Note    Patient Details  Name: Evan [name] MRN: [id number] Date of Birth: 1927/08/31  [facility] CM/SW Contact:    Harriet Masson, RN Phone Number: 09/28/2022, 1:50 PM  Clinical Narrative:                 Spoke to daughter regarding transition needs.  Patient's three children care for patient at home with [facility] home hospice.  DME-all needed. Children transport patient to apts and will transport home.  TOC following  Address, Phone number and PCP verified..   Expected Discharge Plan: Home w Hospice Care Barriers to Discharge: Continued Medical Work up   Patient Goals and CMS Choice Patient states their goals for this hospitalization and ongoing recovery are:: return home with hospice          Expected Discharge Plan and Services       Living arrangements for the past 2 months: Single Family Home                                      Prior Living Arrangements/Services Living arrangements for the past 2 months: Single Family Home Lives with:: Adult Children Patient language and need for interpreter reviewed:: Yes Do you feel safe going back to the place where you live?: Yes      Need for Family Participation in Patient Care: Yes (Comment) Care giver support system in place?: Yes (comment)   Criminal Activity/Legal Involvement Pertinent to Current Situation/Hospitalization: No - Comment as needed  Activities of Daily Living      Permission Sought/Granted                  Emotional Assessment Appearance:: Appears stated age       Alcohol / Substance Use: Not Applicable Psych Involvement: No (comment)  Admission diagnosis:  NSTEMI (non-ST elevated myocardial infarction) (HCC) [I21.4] Chest pain due to myocardial ischemia, unspecified ischemic chest pain type [I25.9] Moderate dementia, unspecified dementia type, unspecified whether behavioral, psychotic, or mood disturbance or  anxiety (HCC) [F03.B0] Patient Active Problem List   Diagnosis Date Noted   NSTEMI (non-ST elevated myocardial infarction) (HCC) 09/27/2022   Anemia 09/27/2022   Gastro-esophageal reflux disease without esophagitis 05/17/2022   Hyperlipidemia, unspecified 05/17/2022   Personal history of other venous thrombosis and embolism 05/17/2022   CKD (chronic kidney disease), stage III (HCC) 05/17/2022   Presence of coronary angioplasty implant and graft 05/17/2022   Malignant neoplasm of unspecified part of right bronchus or lung (HCC) 05/17/2022   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 failure and stage 1 through stage 4 chronic kidney disease, or unspecified chronic kidney disease (HCC) 05/17/2022   Dementia without behavioral disturbance (HCC) 05/17/2022   RSV (respiratory syncytial virus pneumonia) 05/15/2022   Shortness of breath 05/13/2022   Acute on chronic diastolic (congestive) heart failure (HCC) 05/12/2022   Protein-calorie malnutrition, severe 05/12/2022   Chronic respiratory failure, unsp w hypoxia or hypercapnia (HCC) 04/28/2022   Persistent atrial fibrillation (HCC) 04/28/2022   History of pulmonary embolism 04/28/2022   Pneumonia 03/12/2022   Hemiparesis due to old stroke (HCC) 06/25/2021   Mild aortic stenosis 06/23/2021   Rib pain on right side 03/19/2020   Sundowning 07/04/2019   Bilateral hearing loss 04/16/2019   Urinary frequency 03/21/2019   Closed wedge fracture of lumbar vertebra (HCC) 03/14/2019   Chronic right  shoulder pain 12/19/2018   Weakness generalized 09/28/2018   Encounter for palliative care 09/28/2018   Pressure injury of skin 08/27/2018   COPD (chronic obstructive pulmonary disease) (HCC) 08/26/2018   Altered mental status 08/21/2018   UTI (urinary tract infection) 08/21/2018   Abnormality of gait 08/20/2018   Prolonged Q-T interval on ECG    Dysphagia, post-stroke    Supplemental oxygen dependent    Fatigue    SOB (shortness of  breath)    Hyperglycemia    History of lung cancer    Depression    Acute blood loss anemia    Fall 07/16/2018   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 07/14/2018   Cellulitis of arm, right 06/01/2018   Rash and nonspecific skin eruption 06/01/2018   Chest pain 02/02/2018   Thrombocytopenia (HCC) 02/02/2018   Hypokalemia 02/02/2018   Pulmonary embolism (HCC) 02/02/2018   Elevated troponin 02/02/2018   Old MI (myocardial infarction) 06/27/2017   HFmrEF (heart failure with mildly reduced ejection fraction) (HCC) 06/26/2017   Dyspnea 06/21/2017   Acute bronchitis 05/15/2017   Oral candidiasis 05/15/2017   Chest pain at rest 12/20/2016   Radiation pneumonitis (HCC) 11/09/2016   Rheumatoid arthritis, unspecified (HCC) 10/11/2016   Chest wall pain 10/11/2016   Subdural hematoma (HCC) 09/13/2016   Permanent atrial fibrillation (HCC) 05/21/2016   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   GAD (generalized anxiety disorder) 11/20/2015   Hypotension    Cellulitis 09/04/2015   Cellulitis of left axilla    Esophageal ulcer without bleeding    Bilateral lower extremity edema 04/28/2014   Athscl heart disease of native coronary artery w/o ang pctrs 09/09/2013   Benign prostatic hyperplasia without lower urinary tract symptoms 10/08/2012   EKG abnormalities 09/05/2012   Tremor 09/05/2012   Chronic fatigue 09/05/2012   Arthritis    Asthma, chronic    Reflux esophagitis    S/P CABG (coronary artery bypass graft)    Essential hypertension    UNSPECIFIED PERIPHERAL VASCULAR DISEASE 06/16/2009   PCP:  Donita Brooks, MD Pharmacy:   [facility] - [place], [place] - [address] [place] [address] Phone: [phone] Fax: [phone]  [place] [facility] [address] [place] [address] Phone: [phone] Fax: [phone]  [id number] [place], [place] - [address] [place] [address] Phone: [phone] Fax: [phone]  [id number] - [place], [place] - [address] AT [address] [place] [address] Phone: [phone] Fax: [phone]     Social Determinants of Health (SDOH) Social History: SDOH Screenings   Food Insecurity: No Food Insecurity (07/25/2022)  Housing: Low Risk  (07/25/2022)  Transportation Needs: No Transportation Needs (07/25/2022)  Utilities: Not At Risk (07/25/2022)  Alcohol Screen: Low Risk  (06/30/2022)  Depression (PHQ2-9): High Risk (09/22/2022)  Financial Resource Strain: Low Risk  (07/25/2022)  Physical Activity: Inactive (06/30/2022)  Social Connections: Moderately Isolated (06/30/2022)  Stress: No Stress Concern Present (07/25/2022)  Tobacco Use: Medium Risk (09/22/2022)   SDOH Interventions:     Readmission Risk Interventions    05/17/2022    1:25 PM 04/29/2022    3:21 PM  Readmission Risk Prevention Plan  Transportation Screening Complete Complete  HRI or Home Care Consult  Complete  Social Work Consult for Recovery Care Planning/Counseling  Complete  Palliative Care Screening  Not Applicable  Medication Review Oceanographer) Complete Referral to Pharmacy  PCP or Specialist appointment within 3-5 days of discharge Complete   HRI or Home Care Consult Complete

## 2022-09-28 NOTE — Progress Notes (Signed)
Progress Note  Patient Name: Evan [name] Date of Encounter: 09/28/2022 Primary Cardiologist: Lance Muss, MD   Subjective   Overnight there was no troponin elevation consistent with NSTEMI (index event 4 pm, labs X3). Patient notes that he feels fine. Son notes that he was complaining about his breathing all night.  Improved with fentanyl.  No evidence of hypoxia with Sat's in near 100%.   Vital Signs    Vitals:   09/27/22 2330 09/28/22 0100 09/28/22 0204 09/28/22 0420  BP: 106/64 113/61 (!) 107/57 (!) 98/58  Pulse: 86 67 69 66  Resp: 16 (!) 27 20 20   Temp:   98.2 F (36.8 C) 97.6 F (36.4 C)  TempSrc:   Oral Oral  SpO2: 100% 96% 97% 94%  Weight:   63.6 kg     Intake/Output Summary (Last 24 hours) at 09/28/2022 0649 Last data filed at 09/28/2022 4782 Gross per 24 hour  Intake 3 ml  Output 400 ml  Net -397 ml   Filed Weights   09/28/22 0204  Weight: 63.6 kg    Physical Exam   GEN: No acute distress.  Elderly male Neck: No JVD Cardiac: IRIR  with systolic murmur, no rubs, or gallops.  Respiratory: Rhonchi bilateral that improved with cough GI: Soft, nontender, non-distended  MS: No edema  Labs   Telemetry: AF with occasional PVCs   Chemistry Recent Labs  Lab 09/27/22 1731 09/27/22 2006 09/28/22 0230  NA 136  --  136  K 3.7  --  3.3*  CL 102  --  103  CO2 22  --  20*  GLUCOSE 115*  --  127*  BUN 29*  --  32*  CREATININE 1.29*  --  1.55*  CALCIUM 9.3  --  8.9  PROT  --  6.1* 6.1*  ALBUMIN  --  3.0* 3.1*  AST  --  20 20  ALT  --  11 13  ALKPHOS  --  60 58  BILITOT  --  0.8 0.8  GFRNONAA 52*  --  41*  ANIONGAP 12  --  13     Hematology Recent Labs  Lab 09/27/22 1731 09/27/22 2006 09/28/22 0230  WBC 7.7 6.9 6.3  RBC 3.35* 3.13*  3.15* 3.09*  HGB 10.5* 10.1* 9.9*  HCT 32.4* 29.6* 29.4*  MCV 96.7 94.6 95.1  MCH 31.3 32.3 32.0  MCHC 32.4 34.1 33.7  RDW 14.3 14.3 14.5  PLT 53* 52* 50*    Cardiac EnzymesNo results for  input(s): "TROPONINI" in the last 168 hours. No results for input(s): "TROPIPOC" in the last 168 hours.   BNP Recent Labs  Lab 09/27/22 2006  BNP 559.6*     DDimer No results for input(s): "DDIMER" in the last 168 hours.   Cardiac Studies   Cardiac Studies & Procedures   CARDIAC CATHETERIZATION  CARDIAC CATHETERIZATION 06/27/2017  Narrative  Prox RCA lesion is 30% stenosed.  Mid RCA lesion is 10% stenosed.  Ost 1st Mrg lesion is 80% stenosed.  Previously placed Prox Cx stent (unknown type) is widely patent.  Ost 2nd Mrg lesion is 65% stenosed.  Ost LAD lesion is 100% stenosed.  Previously placed Origin to Prox Graft stent (unknown type) is widely patent.  The left ventricular ejection fraction is 55-65% by visual estimate.  LV end diastolic pressure is normal.  The left ventricular systolic function is normal.  Hyperdynamic LV function with a spade-like ventricle and ejection fraction at 60-65%.  Multi vessel native CAD  with total occlusion of the very proximal LAD; 75% ostial stenosis of the OM1 vessel with a patent LCX stent between the OM1 and OM 2 vessels, and 65-70% ostial narrowing at the origin of the OM 2 vessel; dominant RCA with 30% proximal and 10% mid stenosis.  Widely patent free radial graft supplying the OM1 vessel with a patent ostial proximal stent in the graft.  Patent LIMA graft supplying the LAD system.  RECOMMENDATION: Medical therapy.  Findings Coronary Findings Diagnostic  Dominance: Right  Left Anterior Descending Ost LAD lesion is 100% stenosed.  Left Circumflex Previously placed Prox Cx stent (unknown type) is widely patent.  First Obtuse Marginal Branch Ost 1st Mrg lesion is 80% stenosed.  Second Obtuse Marginal Branch Ost 2nd Mrg lesion is 65% stenosed.  Right Coronary Artery Prox RCA lesion is 30% stenosed. Mid RCA lesion is 10% stenosed.  LIMA Graft To Mid LAD  Graft To 1st Mrg Previously placed Origin to Prox  Graft stent (unknown type) is widely patent.  Intervention  No interventions have been documented.     ECHOCARDIOGRAM  ECHOCARDIOGRAM LIMITED 04/28/2022  Narrative ECHOCARDIOGRAM LIMITED REPORT    Patient Name:   Evan [name] Date of Exam: 04/28/2022 Medical Rec #:  [id number]    Height:       63.0 in Accession #:    [id number]   Weight:       170.0 lb Date of Birth:  Aug 02, 1927    BSA:          1.805 m Patient Age:    89 years     BP:           124/59 mmHg Patient Gender: M            HR:           60 bpm. Exam Location:  Inpatient  Procedure: Limited Echo, Cardiac Doppler and Limited Color Doppler  Indications:    CHF-Acute Systolic I50.21  History:        Patient has prior history of Echocardiogram examinations, most recent 04/13/2021. CAD, Aortic Valve Disease, Arrythmias:Atrial Fibrillation, Signs/Symptoms:Shortness of Breath; Risk Factors:Non-Smoker, Hypertension and Dyslipidemia.  Sonographer:    Aron Baba Referring Phys: [id number] RONDELL A SMITH   Sonographer Comments: Image acquisition challenging due to patient body habitus and Image acquisition challenging due to respiratory motion. IMPRESSIONS   1. Assessing wall motion is challenging. Left ventricular ejection fraction, by estimation, is 50 to 55%. The left ventricle has low normal function. 2. Right ventricular systolic function is mildly reduced. The right ventricular size is moderately enlarged. There is moderately elevated pulmonary artery systolic pressure. 3. Left atrial size was moderately dilated. 4. Trivial mitral valve regurgitation. 5. Aortic valve regurgitation is not visualized. 6. The inferior vena cava is normal in size with <50% respiratory variability, suggesting right atrial pressure of 8 mmHg.  FINDINGS Left Ventricle: Assessing wall motion is challenging. Left ventricular ejection fraction, by estimation, is 50 to 55%. The left ventricle has low normal function. There is no left  ventricular hypertrophy.  Right Ventricle: The right ventricular size is moderately enlarged. Right ventricular systolic function is mildly reduced. There is moderately elevated pulmonary artery systolic pressure. The tricuspid regurgitant velocity is 2.93 m/s, and with an assumed right atrial pressure of 15 mmHg, the estimated right ventricular systolic pressure is 49.3 mmHg.  Left Atrium: Left atrial size was moderately dilated.  Right Atrium: Right atrial size was normal in size.  Mitral Valve: Trivial mitral valve  regurgitation.  Tricuspid Valve: Tricuspid valve regurgitation is mild.  Aortic Valve: Aortic valve regurgitation is not visualized.  Pulmonic Valve: Pulmonic valve regurgitation is not visualized.  Venous: The inferior vena cava is normal in size with less than 50% respiratory variability, suggesting right atrial pressure of 8 mmHg.  Additional Comments: Spectral Doppler performed. Color Doppler performed.  LEFT VENTRICLE PLAX 2D LVIDd:         3.80 cm LVIDs:         3.00 cm LV PW:         1.00 cm LV IVS:        0.90 cm   MITRAL VALVE               TRICUSPID VALVE MV Area (PHT): 6.32 cm    TR Peak grad:   34.3 mmHg MV Decel Time: 120 msec    TR Vmax:        293.00 cm/s MV E velocity: 92.20 cm/s MV A velocity: 37.80 cm/s MV E/A ratio:  2.44  Photographer signed by Carolan Clines Signature Date/Time: 04/28/2022/5:35:41 PM    Final             Assessment & Plan   Unstable angina Complex CAD  (s/p CABG in 1998, Prior LCX territory PCI both native and radial to OM1) Significant Dementia - daughter notes he did well with metoprolol; will trial 12.5 mg PO BID - continue imdur 30 mg PO daily (new dose) - no plan for LHC (see 6/25 note) - likely stopping rosuvastatin as outpatient - stop heparin  HFmrEF - Chronic euvolemic will not attempt to uptitrate GDMT in the setting of age and dementia - given rhonchi will give IV lasix and get chest  X ray    Permanent AF; PVCs - with prior SDH but without future falls tolerating AC - will return to DOAC   For questions or updates, please contact [facility] Please consult [email or web address] for contact info under Cardiology/STEMI.      Riley Lam, MD FASE [facility] Cardiologist [facility]  [address] [place], [address] [place], [place] [address] [phone]  6:49 AM

## 2022-09-28 NOTE — Progress Notes (Signed)
Second attempt for Echocardiogram. Patient wants to sit up in the chair for a while. Spoke with the nurse.

## 2022-09-29 ENCOUNTER — Other Ambulatory Visit (HOSPITAL_COMMUNITY): Payer: Medicare HMO

## 2022-09-29 ENCOUNTER — Encounter (HOSPITAL_COMMUNITY): Payer: Self-pay | Admitting: Internal Medicine

## 2022-09-29 ENCOUNTER — Other Ambulatory Visit (HOSPITAL_COMMUNITY): Payer: Self-pay

## 2022-09-29 DIAGNOSIS — I214 Non-ST elevation (NSTEMI) myocardial infarction: Secondary | ICD-10-CM | POA: Diagnosis not present

## 2022-09-29 LAB — CBC
HCT: 26.6 % — ABNORMAL LOW (ref 39.0–52.0)
Hemoglobin: 9 g/dL — ABNORMAL LOW (ref 13.0–17.0)
MCH: 32.3 pg (ref 26.0–34.0)
MCHC: 33.8 g/dL (ref 30.0–36.0)
MCV: 95.3 fL (ref 80.0–100.0)
Platelets: 48 10*3/uL — ABNORMAL LOW (ref 150–400)
RBC: 2.79 MIL/uL — ABNORMAL LOW (ref 4.22–5.81)
RDW: 14.6 % (ref 11.5–15.5)
WBC: 7.7 10*3/uL (ref 4.0–10.5)
nRBC: 0 % (ref 0.0–0.2)

## 2022-09-29 LAB — BASIC METABOLIC PANEL
Anion gap: 7 (ref 5–15)
BUN: 35 mg/dL — ABNORMAL HIGH (ref 8–23)
CO2: 21 mmol/L — ABNORMAL LOW (ref 22–32)
Calcium: 8.5 mg/dL — ABNORMAL LOW (ref 8.9–10.3)
Chloride: 106 mmol/L (ref 98–111)
Creatinine, Ser: 1.3 mg/dL — ABNORMAL HIGH (ref 0.61–1.24)
GFR, Estimated: 51 mL/min — ABNORMAL LOW (ref >60)
Glucose, Bld: 98 mg/dL (ref 70–99)
Potassium: 4 mmol/L (ref 3.5–5.1)
Sodium: 134 mmol/L — ABNORMAL LOW (ref 135–145)

## 2022-09-29 LAB — HEMOGLOBIN A1C
Hgb A1c MFr Bld: 5.7 % — ABNORMAL HIGH (ref 4.8–5.6)
Mean Plasma Glucose: 117 mg/dL

## 2022-09-29 LAB — MAGNESIUM: Magnesium: 1.8 mg/dL (ref 1.7–2.4)

## 2022-09-29 MED ORDER — ISOSORBIDE MONONITRATE ER 30 MG PO TB24
30.0000 mg | ORAL_TABLET | Freq: Every day | ORAL | 0 refills | Status: DC
  Filled 2022-09-29: qty 30, 30d supply, fill #0

## 2022-09-29 MED ORDER — METOPROLOL TARTRATE 25 MG PO TABS
12.5000 mg | ORAL_TABLET | Freq: Two times a day (BID) | ORAL | 0 refills | Status: DC
  Filled 2022-09-29: qty 60, 60d supply, fill #0

## 2022-09-29 MED ORDER — FUROSEMIDE 40 MG PO TABS
40.0000 mg | ORAL_TABLET | Freq: Every day | ORAL | Status: DC
  Administered 2022-09-29: 40 mg via ORAL
  Filled 2022-09-29: qty 1

## 2022-09-29 NOTE — TOC Transition Note (Signed)
Transition of Care [facility]) - CM/SW Discharge Note   Patient Details  Name: Evan [name] MRN: [id number] Date of Birth: 19-Nov-1927  Transition of Care [facility]) CM/SW Contact:  Harriet Masson, RN Phone Number: 09/29/2022, 12:19 PM   Clinical Narrative:     Patient stable for discharge home with hospice.  Victorino Dike with [facility] notified of discharge.  Family will transport home.  Final next level of care: Home w Hospice Care Barriers to Discharge: Barriers Resolved   Patient Goals and CMS Choice    Return home with hospice  Discharge Placement               home          Discharge Plan and Services Additional resources added to the After Visit Summary for                                       Social Determinants of Health (SDOH) Interventions SDOH Screenings   Food Insecurity: No Food Insecurity (09/29/2022)  Housing: Low Risk  (09/29/2022)  Transportation Needs: No Transportation Needs (09/29/2022)  Utilities: Not At Risk (09/29/2022)  Alcohol Screen: Low Risk  (06/30/2022)  Depression (PHQ2-9): High Risk (09/22/2022)  Financial Resource Strain: Low Risk  (07/25/2022)  Physical Activity: Inactive (06/30/2022)  Social Connections: Moderately Isolated (06/30/2022)  Stress: No Stress Concern Present (07/25/2022)  Tobacco Use: Medium Risk (09/29/2022)     Readmission Risk Interventions    05/17/2022    1:25 PM 04/29/2022    3:21 PM  Readmission Risk Prevention Plan  Transportation Screening Complete Complete  HRI or Home Care Consult  Complete  Social Work Consult for Recovery Care Planning/Counseling  Complete  Palliative Care Screening  Not Applicable  Medication Review Oceanographer) Complete Referral to Pharmacy  PCP or Specialist appointment within 3-5 days of discharge Complete   HRI or Home Care Consult Complete

## 2022-09-29 NOTE — Progress Notes (Signed)
Progress Note  Patient Name: Evan [name] Date of Encounter: 09/29/2022 Primary Cardiologist: Lance Muss, MD   Subjective   Patient notes no symptoms. Family notes that he slept well last night. CXR did not show significant volume overload but atelectasis. Saw PT and OT who clarified how much Hospice was working with patient.  Vital Signs    Vitals:   09/28/22 2326 09/29/22 0500 09/29/22 0717 09/29/22 0725  BP: (!) 104/43 90/62 (!) 102/58   Pulse: (!) 59 83 (!) 57   Resp: 16 20 20    Temp: 97.7 F (36.5 C) 97.6 F (36.4 C) 97.6 F (36.4 C)   TempSrc: Oral Axillary Oral   SpO2: 97% 92% 97% 95%  Weight:      Height:        Intake/Output Summary (Last 24 hours) at 09/29/2022 0807 Last data filed at 09/28/2022 2000 Gross per 24 hour  Intake --  Output 925 ml  Net -925 ml   Filed Weights   09/28/22 0204 09/28/22 2055  Weight: 63.6 kg 66.3 kg    Physical Exam   GEN: No acute distress.  Elderly male thin male Neck: No JVD Cardiac: IRIR  with systolic murmur, no rubs, or gallops.  Respiratory: laying flat with improved rhonchi, no O2 needs or tachypnea GI: Soft, nontender, non-distended  MS: No edema  Labs   Telemetry: AF with PVCs; lowest rate is 48, nocturnal, and asymptomatic   Chemistry Recent Labs  Lab 09/27/22 1731 09/27/22 2006 09/28/22 0230 09/29/22 0012  NA 136  --  136 134*  K 3.7  --  3.3* 4.0  CL 102  --  103 106  CO2 22  --  20* 21*  GLUCOSE 115*  --  127* 98  BUN 29*  --  32* 35*  CREATININE 1.29*  --  1.55* 1.30*  CALCIUM 9.3  --  8.9 8.5*  PROT  --  6.1* 6.1*  --   ALBUMIN  --  3.0* 3.1*  --   AST  --  20 20  --   ALT  --  11 13  --   ALKPHOS  --  60 58  --   BILITOT  --  0.8 0.8  --   GFRNONAA 52*  --  41* 51*  ANIONGAP 12  --  13 7     Hematology Recent Labs  Lab 09/27/22 2006 09/28/22 0230 09/29/22 0012  WBC 6.9 6.3 7.7  RBC 3.13*  3.15* 3.09* 2.79*  HGB 10.1* 9.9* 9.0*  HCT 29.6* 29.4* 26.6*  MCV 94.6 95.1  95.3  MCH 32.3 32.0 32.3  MCHC 34.1 33.7 33.8  RDW 14.3 14.5 14.6  PLT 52* 50* 48*    Cardiac EnzymesNo results for input(s): "TROPONINI" in the last 168 hours. No results for input(s): "TROPIPOC" in the last 168 hours.   BNP Recent Labs  Lab 09/27/22 2006  BNP 559.6*     DDimer No results for input(s): "DDIMER" in the last 168 hours.   Cardiac Studies   Cardiac Studies & Procedures   CARDIAC CATHETERIZATION  CARDIAC CATHETERIZATION 06/27/2017  Narrative  Prox RCA lesion is 30% stenosed.  Mid RCA lesion is 10% stenosed.  Ost 1st Mrg lesion is 80% stenosed.  Previously placed Prox Cx stent (unknown type) is widely patent.  Ost 2nd Mrg lesion is 65% stenosed.  Ost LAD lesion is 100% stenosed.  Previously placed Origin to Prox Graft stent (unknown type) is widely patent.  The left  ventricular ejection fraction is 55-65% by visual estimate.  LV end diastolic pressure is normal.  The left ventricular systolic function is normal.  Hyperdynamic LV function with a spade-like ventricle and ejection fraction at 60-65%.  Multi vessel native CAD with total occlusion of the very proximal LAD; 75% ostial stenosis of the OM1 vessel with a patent LCX stent between the OM1 and OM 2 vessels, and 65-70% ostial narrowing at the origin of the OM 2 vessel; dominant RCA with 30% proximal and 10% mid stenosis.  Widely patent free radial graft supplying the OM1 vessel with a patent ostial proximal stent in the graft.  Patent LIMA graft supplying the LAD system.  RECOMMENDATION: Medical therapy.  Findings Coronary Findings Diagnostic  Dominance: Right  Left Anterior Descending Ost LAD lesion is 100% stenosed.  Left Circumflex Previously placed Prox Cx stent (unknown type) is widely patent.  First Obtuse Marginal Branch Ost 1st Mrg lesion is 80% stenosed.  Second Obtuse Marginal Branch Ost 2nd Mrg lesion is 65% stenosed.  Right Coronary Artery Prox RCA lesion is 30%  stenosed. Mid RCA lesion is 10% stenosed.  LIMA Graft To Mid LAD  Graft To 1st Mrg Previously placed Origin to Prox Graft stent (unknown type) is widely patent.  Intervention  No interventions have been documented.     ECHOCARDIOGRAM  ECHOCARDIOGRAM LIMITED 04/28/2022  Narrative ECHOCARDIOGRAM LIMITED REPORT    Patient Name:   Evan [name] Date of Exam: 04/28/2022 Medical Rec #:  [id number]    Height:       63.0 in Accession #:    [id number]   Weight:       170.0 lb Date of Birth:  03-10-1928    BSA:          1.805 m Patient Age:    89 years     BP:           124/59 mmHg Patient Gender: M            HR:           60 bpm. Exam Location:  Inpatient  Procedure: Limited Echo, Cardiac Doppler and Limited Color Doppler  Indications:    CHF-Acute Systolic I50.21  History:        Patient has prior history of Echocardiogram examinations, most recent 04/13/2021. CAD, Aortic Valve Disease, Arrythmias:Atrial Fibrillation, Signs/Symptoms:Shortness of Breath; Risk Factors:Non-Smoker, Hypertension and Dyslipidemia.  Sonographer:    Evan [name] Referring Phys: [id number] Evan [name]   Sonographer Comments: Image acquisition challenging due to patient body habitus and Image acquisition challenging due to respiratory motion. IMPRESSIONS   1. Assessing wall motion is challenging. Left ventricular ejection fraction, by estimation, is 50 to 55%. The left ventricle has low normal function. 2. Right ventricular systolic function is mildly reduced. The right ventricular size is moderately enlarged. There is moderately elevated pulmonary artery systolic pressure. 3. Left atrial size was moderately dilated. 4. Trivial mitral valve regurgitation. 5. Aortic valve regurgitation is not visualized. 6. The inferior vena cava is normal in size with <50% respiratory variability, suggesting right atrial pressure of 8 mmHg.  FINDINGS Left Ventricle: Assessing wall motion is challenging. Left  ventricular ejection fraction, by estimation, is 50 to 55%. The left ventricle has low normal function. There is no left ventricular hypertrophy.  Right Ventricle: The right ventricular size is moderately enlarged. Right ventricular systolic function is mildly reduced. There is moderately elevated pulmonary artery systolic pressure. The tricuspid regurgitant velocity is 2.93 m/s, and with an  assumed right atrial pressure of 15 mmHg, the estimated right ventricular systolic pressure is 49.3 mmHg.  Left Atrium: Left atrial size was moderately dilated.  Right Atrium: Right atrial size was normal in size.  Mitral Valve: Trivial mitral valve regurgitation.  Tricuspid Valve: Tricuspid valve regurgitation is mild.  Aortic Valve: Aortic valve regurgitation is not visualized.  Pulmonic Valve: Pulmonic valve regurgitation is not visualized.  Venous: The inferior vena cava is normal in size with less than 50% respiratory variability, suggesting right atrial pressure of 8 mmHg.  Additional Comments: Spectral Doppler performed. Color Doppler performed.  LEFT VENTRICLE PLAX 2D LVIDd:         3.80 cm LVIDs:         3.00 cm LV PW:         1.00 cm LV IVS:        0.90 cm   MITRAL VALVE               TRICUSPID VALVE MV Area (PHT): 6.32 cm    TR Peak grad:   34.3 mmHg MV Decel Time: 120 msec    TR Vmax:        293.00 cm/s MV E velocity: 92.20 cm/s MV A velocity: 37.80 cm/s MV E/A ratio:  2.44  Photographer signed by Carolan Clines Signature Date/Time: 04/28/2022/5:35:41 PM    Final             Assessment & Plan   Unstable angina Complex CAD  (s/p CABG in 1998, Prior LCX territory PCI both native and radial to OM1) Significant Dementia - metoprolol 12.5 mg PO BID (new, lower does this admission) - continue imdur 30 mg PO daily (new this admission) - no plan for LHC (see 6/25 note) - likely stopping rosuvastatin as outpatient (will arrange outpatient follow up; other son  did not want to make changes without talking to everyone first)  SOB - related to atelectasis, recommend incentive spirometer  (ordered)  HFmrEF - Chronic euvolemic will not attempt to uptitrate GDMT in the setting of age and dementia - he is enrolled in hospice; echo was attempted twice but patient refused; I do not feel this will strongly change long term care - will return PO lasix (40 mg daily)   Permanent AF; PVCs - with prior SDH but without future falls tolerating AC - DOAC  Will sign off - return of metoprolol, new Imdur, return to PO lasix  For questions or updates, please contact [facility] Please consult [email or web address] for contact info under Cardiology/STEMI.      Riley Lam, MD FASE [facility] Cardiologist [facility]  [address] [place], [address] [place], [place] [address] [phone]  8:07 AM

## 2022-09-29 NOTE — Progress Notes (Signed)
F/u arranged per Dr. Debby Bud request, appt placed on AVS.

## 2022-09-29 NOTE — Discharge Summary (Signed)
Physician Discharge Summary  Evan [name] GNF:[id number] DOB: May 25, 1927 DOA: 09/27/2022  PCP: Donita Brooks, MD  Admit date: 09/27/2022 Discharge date: 09/29/2022  Admitted From: Home Disposition:  Home  Recommendations for Outpatient Follow-up:  Follow up with PCP in 1-2 weeks Please obtain BMP/CBC in one week your next doctors visit.  Added Imdur, cont lasix and Lopressor.  Consider outpatient referral to Neurology for evaluation of his tremor.  Outpatient Cardiology follow up.  Family would liek to discuss outpatient provider to discontinue his Statin as recommended by inpatient cardiology.  Has existing outpatient Foley catheter which patient needs to follow-up outpatient with this provider   Discharge Condition: Stable CODE STATUS: Full  Diet recommendation: cardiac  Brief/Interim Summary: 89 year old with history of CAD status post CABG, systolic CHF, EF 84%, BPH, esophageal ulcer, GERD, HLD, HTN, DVT/PE on Eliquis, rheumatoid arthritis, history of some dural hematoma, thrombocytopenia admitted for chest pain.  Upon admission seen by cardiology, troponins remain flat, Imdur was added.  During the hospitalization his condition slowly improved.  A1c and lipid panel are stable.  Medically stable for discharge today.  Rest of the recommendations as stated above.    Chest pain/NSTEMI History of CAD status post CABG in 1998 - Troponins remain flat, LDL was 56, A1c 5.7.  Remains chest pain-free. -Imdur added - Cardiology recommended to continue Lasix and metoprolol.   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  Stable platelets, no  evidence of bleeding.  Follow-up outpatient PCP   Dementia - Supportive care    Discharge Diagnoses:  Principal Problem:   NSTEMI (non-ST elevated myocardial infarction) (HCC) Active Problems:   HFmrEF (heart failure with mildly reduced ejection fraction) (HCC)   Chronic respiratory failure, unsp w hypoxia or hypercapnia (HCC)   History of pulmonary embolism   Persistent atrial fibrillation (HCC)   COPD (chronic obstructive pulmonary disease) (HCC)   CKD (chronic kidney disease), stage III (HCC)   Thrombocytopenia (HCC)   Dementia without behavioral disturbance (HCC)   Anemia      Consultations: Cardiology  Subjective: No complaints feeling okay.  Discharge Exam: Vitals:   09/29/22 0717 09/29/22 0725  BP: (!) 102/58   Pulse: (!) 57   Resp: 20   Temp: 97.6 F (36.4 C)   SpO2: 97% 95%   Vitals:   09/28/22 2326 09/29/22 0500 09/29/22 0717 09/29/22 0725  BP: (!) 104/43 90/62 (!) 102/58   Pulse: (!) 59 83 (!) 57   Resp: 16 20 20    Temp: 97.7 F (36.5 C) 97.6 F (36.4 C) 97.6 F (36.4 C)   TempSrc: Oral Axillary Oral   SpO2: 97% 92% 97% 95%  Weight:      Height:        General: Pt is alert, awake, not in acute distress Cardiovascular: RRR, S1/S2 +, no rubs, no gallops Respiratory: CTA bilaterally, no wheezing, no rhonchi Abdominal: Soft, NT, ND, bowel sounds + Extremities: no edema, no cyanosis  Discharge Instructions   Allergies as of 09/29/2022       Reactions   Neomycin-polymyxin-hc Itching   Piroxicam Rash, Other (See Comments)   Blistering rash   Statins Other (See  Comments)   Myalgias   Diazepam Other (See Comments)   Dizziness REACTION:  unknown, REACTION:  unknown   Doxycycline Nausea Only, Itching, Other (See Comments)   REACTION:  unknown, REACTION:  unknown   Gatifloxacin Anxiety, Swelling   Latex Rash   Band Aid        Medication List     TAKE these medications    acetaminophen 500 MG tablet Commonly known as:  TYLENOL Take 500 mg by mouth 2 (two) times daily as needed for mild pain, moderate pain, fever or headache.   albuterol (2.5 MG/3ML) 0.083% nebulizer solution Commonly known as: PROVENTIL Take 3 mLs (2.5 mg total) by nebulization every 4 (four) hours as needed for wheezing or shortness of breath.   Centrum Silver tablet Take 1 tablet by mouth daily.   clotrimazole-betamethasone cream Commonly known as: LOTRISONE Apply a small amount topically to cleansed affected area 2 (two) times daily for 7 to 10 days   Eliquis 2.5 MG Tabs tablet Generic drug: apixaban Take 1 tablet (2.5 mg total) by mouth 2 (two) times daily.   FeroSul 325 (65 FE) MG tablet Generic drug: ferrous sulfate TAKE 1 TABLET EVERY OTHER DAY   finasteride 5 MG tablet Commonly known as: PROSCAR Take 1 tablet (5 mg total) by mouth daily.   FLUoxetine 20 MG capsule Commonly known as: PROZAC TAKE 1 CAPSULE EVERY DAY   folic acid 1 MG tablet Commonly known as: FOLVITE Take 1 tablet (1 mg total) by mouth daily.   furosemide 40 MG tablet Commonly known as: LASIX Take 1 tablet (40 mg total) by mouth daily.   isosorbide mononitrate 30 MG 24 hr tablet Commonly known as: IMDUR Take 1 tablet (30 mg total) by mouth daily. Start taking on: September 30, 2022   metoprolol tartrate 25 MG tablet Commonly known as: LOPRESSOR Take 0.5 tablets (12.5 mg total) by mouth 2 (two) times daily.   pantoprazole 40 MG tablet Commonly known as: PROTONIX TAKE 1 TABLET EVERY MORNING   polyethylene glycol 17 g packet Commonly known as: MIRALAX / GLYCOLAX Take 17 g by mouth daily as needed for mild constipation.   rosuvastatin 5 MG tablet Commonly known as: CRESTOR TAKE 1 TABLET EVERY DAY What changed: when to take this   senna-docusate 8.6-50 MG tablet Commonly known as: Stimulant Laxative Take 2 tablets by mouth 2 (two) times daily as needed for constipation   SF 5000 Plus 1.1 % Crea dental cream Generic drug: sodium  fluoride Brush on teeth for 3-5 minutes then spit once per day   SiliGentle Foam Dressng/Sacral 7"X7" Pads Apply daily at 6 (six) AM.   Symbicort 160-4.5 MCG/ACT inhaler Generic drug: budesonide-formoterol Inhale 2 puffs into the lungs in the morning and at bedtime.   tacrolimus 0.1 % ointment Commonly known as: PROTOPIC Apply 1 Application topically daily as needed (for itching).   tamsulosin 0.4 MG Caps capsule Commonly known as: FLOMAX TAKE 1 CAPSULE EVERY DAY What changed: when to take this   VITAMIN D-3 PO Take 1 capsule by mouth in the morning.   Xeroform Petrolatum Dres 4"x4" 3 % Pads Apply 1 application  topically daily.   Xiidra 5 % Soln Generic drug: Lifitegrast Place 1 drop into both eyes 2 (two) times daily. What changed:  when to take this reasons to take this   Zinc Oxide 12.8 % ointment Commonly known as: TRIPLE PASTE Apply topically 4 (four) times daily as needed (skin barrier). What changed:  how much to  take when to take this        Follow-up Information     Louanne Skye Devoria Albe., NP Follow up.   Specialty: Cardiology Why: [facility] - [address] location - cardiology follow-up arranged on Friday October 14, 2022 at 8:25 AM (Arrive by 8:10 AM). Alden Server is one of our nurse practitioners with our cardiology team. Contact information: [address] [place] [address] [phone]                Allergies  Allergen Reactions   Neomycin-Polymyxin-Hc Itching   Piroxicam Rash and Other (See Comments)    Blistering rash     Statins Other (See Comments)    Myalgias   Diazepam Other (See Comments)    Dizziness  REACTION:  unknown, REACTION:  unknown   Doxycycline Nausea Only, Itching and Other (See Comments)    REACTION:  unknown, REACTION:  unknown   Gatifloxacin Anxiety and Swelling   Latex Rash    Band Aid    You were cared for by a hospitalist during your hospital stay. If you have any questions about  your discharge medications or the care you received while you were in the hospital after you are discharged, you can call the unit and asked to speak with the hospitalist on call if the hospitalist that took care of you is not available. Once you are discharged, your primary care physician will handle any further medical issues. Please note that no refills for any discharge medications will be authorized once you are discharged, as it is imperative that you return to your primary care physician (or establish a relationship with a primary care physician if you do not have one) for your aftercare needs so that they can reassess your need for medications and monitor your lab values.  You were cared for by a hospitalist during your hospital stay. If you have any questions about your discharge medications or the care you received while you were in the hospital after you are discharged, you can call the unit and asked to speak with the hospitalist on call if the hospitalist that took care of you is not available. Once you are discharged, your primary care physician will handle any further medical issues. Please note that NO REFILLS for any discharge medications will be authorized once you are discharged, as it is imperative that you return to your primary care physician (or establish a relationship with a primary care physician if you do not have one) for your aftercare needs so that they can reassess your need for medications and monitor your lab values.  Please request your Prim.MD to go over all Hospital Tests and Procedure/Radiological results at the follow up, please get all Hospital records sent to your Prim MD by signing hospital release before you go home.  Get CBC, CMP, 2 view Chest X ray checked  by Primary MD during your next visit or SNF MD in 5-7 days ( we routinely change or add medications that can affect your baseline labs and fluid status, therefore we recommend that you get the mentioned basic  workup next visit with your PCP, your PCP may decide not to get them or add new tests based on their clinical decision)  On your next visit with your primary care physician please Get Medicines reviewed and adjusted.  If you experience worsening of your admission symptoms, develop shortness of breath, life threatening emergency, suicidal or homicidal thoughts you must seek medical attention immediately  by calling 911 or calling your MD immediately  if symptoms less severe.  You Must read complete instructions/literature along with all the possible adverse reactions/side effects for all the Medicines you take and that have been prescribed to you. Take any new Medicines after you have completely understood and accpet all the possible adverse reactions/side effects.   Do not drive, operate heavy machinery, perform activities at heights, swimming or participation in water activities or provide baby sitting services if your were admitted for syncope or siezures until you have seen by Primary MD or a Neurologist and advised to do so again.  Do not drive when taking Pain medications.   Procedures/Studies: DG Chest 2 View  Result Date: 09/28/2022 CLINICAL DATA:  Shortness of breath.  RIGHT-sided chest pain. EXAM: CHEST - 2 VIEW COMPARISON:  09/27/2022 FINDINGS: Prior median sternotomy. The heart is mildly enlarged, stable. Low lung volumes. There is minimal subsegmental atelectasis. Mildly prominent interstitial markings are noted and are stable. No new consolidations. IMPRESSION: 1. Low lung volumes. 2. Chronic mild prominence of interstitial markings. Electronically Signed   By: Norva Pavlov M.D.   On: 09/28/2022 08:34   DG Chest Portable 1 View  Result Date: 09/27/2022 CLINICAL DATA:  Chest pain. EXAM: PORTABLE CHEST 1 VIEW COMPARISON:  May 31, 2022. FINDINGS: Stable cardiomediastinal silhouette. Sternotomy wires are noted. Hypoinflation of the lungs is noted with minimal bibasilar  subsegmental atelectasis. Bony thorax is unremarkable. IMPRESSION: Hypoinflation of the lungs with minimal bibasilar subsegmental atelectasis. Electronically Signed   By: Lupita Raider M.D.   On: 09/27/2022 18:26     The results of significant diagnostics from this hospitalization (including imaging, microbiology, ancillary and laboratory) are listed below for reference.     Microbiology: Recent Results (from the past 240 hour(s))  MRSA Next Gen by PCR, Nasal     Status: Abnormal   Collection Time: 09/28/22  1:47 AM   Specimen: Nasal Mucosa; Nasal Swab  Result Value Ref Range Status   MRSA by PCR Next Gen DETECTED (A) NOT DETECTED Final    Comment: RESULTS CALLED TO, READ BACK BY AND VERIFIED WITH RN K.ISLAND ON 09/28/22 AT 0508 BY NM (NOTE) The GeneXpert MRSA Assay (FDA approved for NASAL specimens only), is one component of a comprehensive MRSA colonization surveillance program. It is not intended to diagnose MRSA infection nor to guide or monitor treatment for MRSA infections. Test performance is not FDA approved in patients less than 30 years old. Performed at [facility], [address], [place], [place] [address]      Labs: BNP (last 3 results) Recent Labs    05/01/22 1221 05/11/22 2301 09/27/22 2006  BNP 176.7* 169.5* 559.6*   Basic Metabolic Panel: Recent Labs  Lab 09/27/22 1731 09/27/22 2006 09/28/22 0230 09/29/22 0012  NA 136  --  136 134*  K 3.7  --  3.3* 4.0  CL 102  --  103 106  CO2 22  --  20* 21*  GLUCOSE 115*  --  127* 98  BUN 29*  --  32* 35*  CREATININE 1.29*  --  1.55* 1.30*  CALCIUM 9.3  --  8.9 8.5*  MG  --  1.8 2.0 1.8  PHOS  --  2.8 3.6  --    Liver Function Tests: Recent Labs  Lab 09/27/22 2006 09/28/22 0230  AST 20 20  ALT 11 13  ALKPHOS 60 58  BILITOT 0.8 0.8  PROT 6.1* 6.1*  ALBUMIN 3.0* 3.1*  No results for input(s): "LIPASE", "AMYLASE" in the last 168 hours. No results for input(s): "AMMONIA" in the last 168  hours. CBC: Recent Labs  Lab 09/27/22 1731 09/27/22 2006 09/28/22 0230 09/29/22 0012  WBC 7.7 6.9 6.3 7.7  NEUTROABS  --  4.6  --   --   HGB 10.5* 10.1* 9.9* 9.0*  HCT 32.4* 29.6* 29.4* 26.6*  MCV 96.7 94.6 95.1 95.3  PLT 53* 52* 50* 48*   Cardiac Enzymes: Recent Labs  Lab 09/27/22 2006  CKTOTAL 30*   BNP: Invalid input(s): "POCBNP" CBG: No results for input(s): "GLUCAP" in the last 168 hours. D-Dimer No results for input(s): "DDIMER" in the last 72 hours. Hgb A1c Recent Labs    09/28/22 0230  HGBA1C 5.7*   Lipid Profile Recent Labs    09/28/22 0230  CHOL 111  HDL 40*  LDLCALC 56  TRIG 75  CHOLHDL 2.8   Thyroid function studies Recent Labs    09/27/22 2006  TSH 2.480   Anemia work up Recent Labs    09/27/22 2006 09/27/22 2018  VITAMINB12  --  641  FOLATE  --  >40.0  FERRITIN  --  88  TIBC  --  262  IRON  --  71  RETICCTPCT 1.6  --    Urinalysis    Component Value Date/Time   COLORURINE YELLOW 04/28/2022 0153   APPEARANCEUR CLEAR 04/28/2022 0153   LABSPEC 1.010 04/28/2022 0153   PHURINE 6.0 04/28/2022 0153   GLUCOSEU NEGATIVE 04/28/2022 0153   HGBUR NEGATIVE 04/28/2022 0153   BILIRUBINUR NEGATIVE 04/28/2022 0153   KETONESUR NEGATIVE 04/28/2022 0153   PROTEINUR NEGATIVE 04/28/2022 0153   UROBILINOGEN 0.2 07/13/2014 1804   NITRITE NEGATIVE 04/28/2022 0153   LEUKOCYTESUR NEGATIVE 04/28/2022 0153   Sepsis Labs Recent Labs  Lab 09/27/22 1731 09/27/22 2006 09/28/22 0230 09/29/22 0012  WBC 7.7 6.9 6.3 7.7   Microbiology Recent Results (from the past 240 hour(s))  MRSA Next Gen by PCR, Nasal     Status: Abnormal   Collection Time: 09/28/22  1:47 AM   Specimen: Nasal Mucosa; Nasal Swab  Result Value Ref Range Status   MRSA by PCR Next Gen DETECTED (A) NOT DETECTED Final    Comment: RESULTS CALLED TO, READ BACK BY AND VERIFIED WITH RN K.ISLAND ON 09/28/22 AT 0508 BY NM (NOTE) The GeneXpert MRSA Assay (FDA approved for NASAL specimens  only), is one component of a comprehensive MRSA colonization surveillance program. It is not intended to diagnose MRSA infection nor to guide or monitor treatment for MRSA infections. Test performance is not FDA approved in patients less than 67 years old. Performed at [facility], [address], [place], [place] [address]      Time coordinating discharge:  I have spent 35 minutes face to face with the patient and on the ward discussing the patients care, assessment, plan and disposition with other care givers. >50% of the time was devoted counseling the patient about the risks and benefits of treatment/Discharge disposition and coordinating care.   SIGNED:   Dimple Nanas, MD  [facility] 09/29/2022, 10:52 AM   If 7PM-7AM, please contact night-coverage

## 2022-09-30 ENCOUNTER — Other Ambulatory Visit (HOSPITAL_COMMUNITY): Payer: Self-pay

## 2022-09-30 MED ORDER — CEPHALEXIN 500 MG PO CAPS
500.0000 mg | ORAL_CAPSULE | Freq: Two times a day (BID) | ORAL | 0 refills | Status: DC
  Filled 2022-09-30: qty 14, 7d supply, fill #0

## 2022-10-02 ENCOUNTER — Other Ambulatory Visit: Payer: Self-pay | Admitting: Interventional Cardiology

## 2022-10-05 ENCOUNTER — Other Ambulatory Visit (HOSPITAL_COMMUNITY): Payer: Self-pay

## 2022-10-11 ENCOUNTER — Other Ambulatory Visit (HOSPITAL_COMMUNITY): Payer: Self-pay

## 2022-10-11 MED ORDER — SULFAMETHOXAZOLE-TRIMETHOPRIM 800-160 MG PO TABS
1.0000 | ORAL_TABLET | Freq: Two times a day (BID) | ORAL | 0 refills | Status: DC
  Filled 2022-10-11: qty 28, 14d supply, fill #0

## 2022-10-13 ENCOUNTER — Other Ambulatory Visit (HOSPITAL_COMMUNITY): Payer: Self-pay

## 2022-10-13 ENCOUNTER — Other Ambulatory Visit: Payer: Self-pay | Admitting: Family Medicine

## 2022-10-13 MED ORDER — APIXABAN 2.5 MG PO TABS
2.5000 mg | ORAL_TABLET | Freq: Two times a day (BID) | ORAL | 1 refills | Status: DC
  Filled 2022-10-13: qty 60, 30d supply, fill #0

## 2022-10-13 NOTE — Progress Notes (Signed)
Office Visit    Patient Name: Evan [name] Date of Encounter: 10/14/2022  Primary Care Provider:  Donita Brooks, MD Primary Cardiologist:  Lance Muss, MD Primary Electrophysiologist: None   Past Medical History    Past Medical History:  Diagnosis Date   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   Anginal chest pain at rest    Chronic non, controlled on Lorazepam   Anxiety    Arthritis    "all over"    BPH (benign prostatic hyperplasia)    CAD (coronary artery disease)    Chronic lower back pain    Complication of anesthesia    "problems making water afterwards"   Depression    DJD (degenerative joint disease)    Esophageal ulcer without bleeding    bid ppi indefinitely   Family history of adverse reaction to anesthesia    "children w/PONV"   GERD (gastroesophageal reflux disease)    Headache    "couple/week maybe" (09/04/2015)   Hemochromatosis    Possible, elevated iron stores, hook-like osteophytes on hand films, normal LFTs   Hepatitis    "yellow jaundice as a baby"   History of ASCVD    MULTIVESSEL   Hyperlipidemia    Hypertension    Mild aortic stenosis 06/23/2021   Osteoarthritis    Pneumonia    "several times; got a little now" (09/04/2015)   Rheumatoid arthritis (HCC)    Subdural hematoma (HCC)    small after fall 09/2016-plavix held, neurosurgery consulted.  Asymptomatic.     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 he failed PO Eliquis   Past Surgical History:  Procedure Laterality Date   ARM SKIN LESION BIOPSY / EXCISION Left 09/02/2015   CATARACT EXTRACTION W/ INTRAOCULAR LENS  IMPLANT, BILATERAL Bilateral    CHOLECYSTECTOMY OPEN     CORNEAL TRANSPLANT Bilateral    "one at [facility]; one at [facility]"   CORONARY ANGIOPLASTY WITH STENT PLACEMENT     CORONARY ARTERY BYPASS GRAFT  1999   DESCENDING AORTIC ANEURYSM REPAIR W/ STENT     DE stent ostium into the right radial free graft at OM-, 02-2007   ESOPHAGOGASTRODUODENOSCOPY  N/A 11/09/2014   Procedure: ESOPHAGOGASTRODUODENOSCOPY (EGD);  Surgeon: Dorena Cookey, MD;  Location: [facility] ENDOSCOPY;  Service: Endoscopy;  Laterality: N/A;   INGUINAL HERNIA REPAIR     JOINT REPLACEMENT     LEFT HEART CATH AND CORS/GRAFTS ANGIOGRAPHY N/A 06/27/2017   Procedure: LEFT HEART CATH AND CORS/GRAFTS ANGIOGRAPHY;  Surgeon: Lennette Bihari, MD;  Location: [facility] INVASIVE CV LAB;  Service: Cardiovascular;  Laterality: N/A;   NASAL SINUS SURGERY     SHOULDER OPEN ROTATOR CUFF REPAIR Right    TOTAL KNEE ARTHROPLASTY Bilateral     Allergies  Allergies  Allergen Reactions   Neomycin-Polymyxin-Hc Itching   Piroxicam Rash and Other (See Comments)    Blistering rash     Statins Other (See Comments)    Myalgias   Diazepam Other (See Comments)    Dizziness  REACTION:  unknown, REACTION:  unknown   Doxycycline Nausea Only, Itching and Other (See Comments)    REACTION:  unknown, REACTION:  unknown   Gatifloxacin Anxiety and Swelling   Latex Rash    Band Aid     History of Present Illness    Evan [name]  is a 89 year old male with a PMH of CAD s/p CABG x2 1998 with PCI/DES to left circumflex in 2008, HLD, HTN, HFmrEF, PE (on  Eliquis), CVA, subdural hematoma 's 2018, NSCLC of the right lung, hemochromatosis, permanent AF, aortic stenosis, dementia who presents today for posthospital follow-up.  [name] has been followed by [facility] since 2017 seen for preoperative clearance.  He had CABG x 2 in 1998 with PCI/DES to left circumflex in 2008.  He underwent a Myoview in 2012 that was normal.  He was diagnosed with non-small cell lung cancer of the right lung and underwent chemo and radiation therapy.  He was seen in 2018 with complaint of shortness of breath and found to be in AF with SVR and was not placed on DOAC due to heightened bleeding risk with thrombocytopenia. He had spontaneous conversion to sinus rhythm at that time.  2D echo showed normal LVEF and mild AS.  He was seen for subsequent ED  visit for chest pain and was found to have possible pericarditis and was placed on high-dose aspirin and colchicine.  He was diagnosed with NSTEMI in 2019 and underwent LHC that showed patent grafts and stents with medical therapy recommended.  Evan [name] presented to the ED on 09/27/2022 with complaint of chest pain.  Troponins were elevated consistent with NSTEMI.  No plan was made for LHC due to significant dementia and elevated procedure risk.  He was started on low-dose beta-blocker with plan to add Imdur 30 mg if resting heart rate increased.  Patient was discharged in stable condition on 09/29/2022.  Evan [name] presents today for post hospital follow-up with his daughter.  Since last being seen in the office patient reports that he has been doing well with no complaints of chest pain.  His blood pressure today is controlled at 98/56 and heart rate was initially 105 and after recheck was 65.  He has been compliant with his current medications however his daughter reports that Imdur was never started due to possible bradycardia and hypotension.  He reports taking half a tablet of metoprolol which has yielded less side effects.  He is euvolemic on examination today and patient has been staying hydrated with good diuresis with Lasix when discussing further with his daughter she feels that his chest pain was related more to a panic attack and not associated with his heart..  Patient denies chest pain, palpitations, dyspnea, PND, orthopnea, nausea, vomiting, dizziness, syncope, edema, weight gain, or early satiety.   Home Medications    Current Outpatient Medications  Medication Sig Dispense Refill   acetaminophen (TYLENOL) 500 MG tablet Take 500 mg by mouth 2 (two) times daily as needed for mild pain, moderate pain, fever or headache.     albuterol (PROVENTIL) (2.5 MG/3ML) 0.083% nebulizer solution Take 3 mLs (2.5 mg total) by nebulization every 4 (four) hours as needed for wheezing or shortness of breath.  75 mL 12   Bismuth Tribromoph-Petrolatum (XEROFORM PETROLATUM DRES 4"X4") 3 % PADS Apply 1 application  topically daily. 25 each 3   budesonide-formoterol (SYMBICORT) 160-4.5 MCG/ACT inhaler Inhale 2 puffs into the lungs in the morning and at bedtime. 10.2 g 12   Cholecalciferol (VITAMIN D-3 PO) Take 1 capsule by mouth in the morning.     ferrous sulfate (FEROSUL) 325 (65 FE) MG tablet TAKE 1 TABLET EVERY OTHER DAY 90 tablet 3   finasteride (PROSCAR) 5 MG tablet Take 1 tablet (5 mg total) by mouth daily. 90 tablet 3   FLUoxetine (PROZAC) 20 MG capsule TAKE 1 CAPSULE EVERY DAY (Patient taking differently: Take 20 mg by mouth daily.) 90 capsule 3   folic acid (  FOLVITE) 1 MG tablet Take 1 tablet (1 mg total) by mouth daily. 30 tablet 2   furosemide (LASIX) 40 MG tablet Take 1 tablet (40 mg total) by mouth daily. 30 tablet 0   Gauze Pads & Dressings (SILIGENTLE FOAM DRESSNG/SACRAL) 7"X7" PADS Apply daily at 6 (six) AM. 5 each 11   Lifitegrast (XIIDRA) 5 % SOLN Place 1 drop into both eyes 2 (two) times daily. (Patient taking differently: Place 1 drop into both eyes as needed (dry eyes).) 60 each 12   metoprolol tartrate (LOPRESSOR) 25 MG tablet Take 12.5 mg by mouth daily at 6 (six) AM. Can take an additional half tablet as needed for chest pain or elevated blood pressure     Multiple Vitamins-Minerals (CENTRUM SILVER) tablet Take 1 tablet by mouth daily.     pantoprazole (PROTONIX) 40 MG tablet TAKE 1 TABLET EVERY MORNING 90 tablet 2   polyethylene glycol (MIRALAX / GLYCOLAX) 17 g packet Take 17 g by mouth daily as needed for mild constipation. 14 each 0   rosuvastatin (CRESTOR) 5 MG tablet TAKE 1 TABLET EVERY DAY 30 tablet 0   senna-docusate (STIMULANT LAXATIVE) 8.6-50 MG tablet Take 2 tablets by mouth 2 (two) times daily as needed for constipation 60 tablet 2   sodium fluoride (SF 5000 PLUS) 1.1 % CREA dental cream Brush on teeth for 3-5 minutes then spit once per day 51 g 1    sulfamethoxazole-trimethoprim (BACTRIM DS) 800-160 MG tablet Take 1 tablet by mouth 2 (two) times daily. Take with food 28 tablet 0   tacrolimus (PROTOPIC) 0.1 % ointment Apply 1 Application topically daily as needed (for itching).     tamsulosin (FLOMAX) 0.4 MG CAPS capsule TAKE 1 CAPSULE EVERY DAY (Patient taking differently: Take 0.4 mg by mouth daily with supper.) 90 capsule 10   Zinc Oxide (TRIPLE PASTE) 12.8 % ointment Apply topically 4 (four) times daily as needed (skin barrier). (Patient taking differently: Apply 1 Application topically 2 (two) times daily as needed (skin barrier).) 56.7 g 0   apixaban (ELIQUIS) 2.5 MG TABS tablet Take 1 tablet (2.5 mg total) by mouth 2 (two) times daily. 180 tablet 3   No current facility-administered medications for this visit.     Review of Systems  Please see the history of present illness.    (+) Fatigue  All other systems reviewed and are otherwise negative except as noted above.  Physical Exam    Wt Readings from Last 3 Encounters:  10/14/22 146 lb (66.2 kg)  09/28/22 146 lb 2.6 oz (66.3 kg)  09/22/22 148 lb (67.1 kg)   VS: Vitals:   10/14/22 0837  BP: (!) 98/56  Pulse: (!) 105  SpO2: 97%  ,Body mass index is 25.86 kg/m.  Constitutional:      Appearance: Healthy appearance. Not in distress.  Neck:     Vascular: JVD normal.  Pulmonary:     Effort: Pulmonary effort is normal.     Breath sounds: No wheezing. No rales. Diminished in the bases Cardiovascular:     Normal rate. Regular rhythm. Normal S1. Normal S2.      Murmurs: There is no murmur.  Edema:    Peripheral edema absent.  Abdominal:     Palpations: Abdomen is soft non tender. There is no hepatomegaly.  Skin:    General: Skin is warm and dry.  Neurological:     General: No focal deficit present.     Mental Status: Alert and oriented to person, place and  time.     Cranial Nerves: Cranial nerves are intact.  EKG/LABS/ Recent Cardiac Studies    ECG personally  reviewed by me today -sinus rhythm with first-degree AVB and rate of 65 bpm with no acute changes consistent with previous EKG.   Risk Assessment/Calculations:    CHA2DS2-VASc Score = 7   This indicates a 11.2% annual risk of stroke. The patient's score is based upon: CHF History: 1 HTN History: 1 Diabetes History: 0 Stroke History: 2 Vascular Disease History: 1 Age Score: 2 Gender Score: 0           Lab Results  Component Value Date   WBC 7.7 09/29/2022   HGB 9.0 (L) 09/29/2022   HCT 26.6 (L) 09/29/2022   MCV 95.3 09/29/2022   PLT 48 (L) 09/29/2022   Lab Results  Component Value Date   CREATININE 1.30 (H) 09/29/2022   BUN 35 (H) 09/29/2022   NA 134 (L) 09/29/2022   K 4.0 09/29/2022   CL 106 09/29/2022   CO2 21 (L) 09/29/2022   Lab Results  Component Value Date   ALT 13 09/28/2022   AST 20 09/28/2022   ALKPHOS 58 09/28/2022   BILITOT 0.8 09/28/2022   Lab Results  Component Value Date   CHOL 111 09/28/2022   HDL 40 (L) 09/28/2022   LDLCALC 56 09/28/2022   TRIG 75 09/28/2022   CHOLHDL 2.8 09/28/2022    Lab Results  Component Value Date   HGBA1C 5.7 (H) 09/28/2022     Assessment & Plan    1.  Coronary artery disease: -s/p CABG x 2 in 1998, left circumflex PCI's 2 negative and radial OM1 with most recent ischemic evaluation completed 2019 by LHC -Restarted on low-dose beta-blocker and Imdur in hospital. -Today patient reports he has been doing well with no cardiac complaints or angina since hospitalization. -Continue current GDMT with metoprolol 12.5 mg, Crestor 5 mg  2.  HFmrEF: -2D echo completed showing EF of 50-55% with mildly reduced RV function and moderately enlarged RV with trivial MVR -GDMT not uptitrated due to progressive age and history of dementia. -Today patient is euvolemic on examination and has been staying hydrated. -Continue Lasix 40 mg daily -Low sodium diet, fluid restriction <2L, and daily weights encouraged. Educated to  contact our office for weight gain of 2 lbs overnight or 5 lbs in one week.   3.  Permanent AF: -GDMT not uptitrated due to progressive age and history of dementia. -Patient is rate controlled at 65 bpm and will continue metoprolol 12.5 mg daily -Patient's creatinine was 1.3 and hemoglobin was 9 -We will continue Eliquis 2.5 mg twice daily -CHA2DS2-VASc Score = 7 [CHF History: 1, HTN History: 1, Diabetes History: 0, Stroke History: 2, Vascular Disease History: 1, Age Score: 2, Gender Score: 0].  Therefore, the patient's annual risk of stroke is 11.2 %.      4.  Essential hypertension: -Patient's blood pressure today was 98/56 -Continue metoprolol 12.5 mg daily  5.  Hyperlipidemia: -Per patient's daughter they would like to stay on Crestor 5 mg at this time. -Continue Crestor 5 mg daily   Disposition: Follow-up with Lance Muss, MD or APP in 3 months    Medication Adjustments/Labs and Tests Ordered: Current medicines are reviewed at length with the patient today.  Concerns regarding medicines are outlined above.   Signed, Napoleon Form, Leodis Rains, NP 10/14/2022, 9:43 AM [facility]

## 2022-10-14 ENCOUNTER — Encounter: Payer: Self-pay | Admitting: Nurse Practitioner

## 2022-10-14 ENCOUNTER — Ambulatory Visit: Payer: Medicare HMO | Attending: Nurse Practitioner | Admitting: Nurse Practitioner

## 2022-10-14 VITALS — BP 98/56 | HR 105 | Ht 63.0 in | Wt 146.0 lb

## 2022-10-14 DIAGNOSIS — I5020 Unspecified systolic (congestive) heart failure: Secondary | ICD-10-CM

## 2022-10-14 DIAGNOSIS — E785 Hyperlipidemia, unspecified: Secondary | ICD-10-CM | POA: Diagnosis not present

## 2022-10-14 DIAGNOSIS — I2510 Atherosclerotic heart disease of native coronary artery without angina pectoris: Secondary | ICD-10-CM | POA: Diagnosis not present

## 2022-10-14 DIAGNOSIS — I10 Essential (primary) hypertension: Secondary | ICD-10-CM | POA: Diagnosis not present

## 2022-10-14 DIAGNOSIS — I4821 Permanent atrial fibrillation: Secondary | ICD-10-CM

## 2022-10-14 LAB — CBC

## 2022-10-14 LAB — BASIC METABOLIC PANEL
BUN/Creatinine Ratio: 16 (ref 10–24)
Chloride: 99 mmol/L (ref 96–106)
Creatinine, Ser: 1.51 mg/dL — ABNORMAL HIGH (ref 0.76–1.27)
Glucose: 90 mg/dL (ref 70–99)
Sodium: 136 mmol/L (ref 134–144)

## 2022-10-14 MED ORDER — APIXABAN 2.5 MG PO TABS
2.5000 mg | ORAL_TABLET | Freq: Two times a day (BID) | ORAL | 3 refills | Status: DC
Start: 2022-10-14 — End: 2023-10-25

## 2022-10-14 NOTE — Patient Instructions (Addendum)
Medication Instructions:  STOP Imdur  CHANGE Metoprolol 12.5mg  Take as needed for chest pain or elevated blood pressure  *If you need a refill on your cardiac medications before your next appointment, please call your pharmacy*   Lab Work: TODAY-BMET & CBC If you have labs (blood work) drawn today and your tests are completely normal, you will receive your results only by: MyChart Message (if you have MyChart) OR A paper copy in the mail If you have any lab test that is abnormal or we need to change your treatment, we will call you to review the results.   Testing/Procedures: NONE ORDERED   Follow-Up: At [facility], you and your health needs are our priority.  As part of our continuing mission to provide you with exceptional heart care, we have created designated Provider Care Teams.  These Care Teams include your primary Cardiologist (physician) and Advanced Practice Providers (APPs -  Physician Assistants and Nurse Practitioners) who all work together to provide you with the care you need, when you need it.  We recommend signing up for the patient portal called "MyChart".  Sign up information is provided on this After Visit Summary.  MyChart is used to connect with patients for Virtual Visits (Telemedicine).  Patients are able to view lab/test results, encounter notes, upcoming appointments, etc.  Non-urgent messages can be sent to your provider as well.   To learn more about what you can do with MyChart, go to [email or web address].    Your next appointment:   3 month(s)  Provider:   Lance Muss, MD  or Tereso Newcomer, [place]   Other Instructions

## 2022-10-15 LAB — BASIC METABOLIC PANEL
BUN: 24 mg/dL (ref 10–36)
CO2: 18 mmol/L — ABNORMAL LOW (ref 20–29)
Calcium: 9 mg/dL (ref 8.6–10.2)
Potassium: 4 mmol/L (ref 3.5–5.2)
eGFR: 43 mL/min/{1.73_m2} — ABNORMAL LOW (ref >59)

## 2022-10-15 LAB — CBC
Hematocrit: 31 % — ABNORMAL LOW (ref 37.5–51.0)
MCHC: 32.9 g/dL (ref 31.5–35.7)
MCV: 98 fL — ABNORMAL HIGH (ref 79–97)
Platelets: 75 10*3/uL — CL (ref 150–450)
RDW: 13.4 % (ref 11.6–15.4)
WBC: 8.9 10*3/uL (ref 3.4–10.8)

## 2022-10-24 ENCOUNTER — Other Ambulatory Visit: Payer: Self-pay | Admitting: Interventional Cardiology

## 2022-10-27 ENCOUNTER — Telehealth: Payer: Self-pay | Admitting: Family Medicine

## 2022-10-27 NOTE — Telephone Encounter (Signed)
Erroneous encounter. Please disregard.

## 2022-10-31 ENCOUNTER — Other Ambulatory Visit (HOSPITAL_COMMUNITY): Payer: Self-pay

## 2022-11-05 ENCOUNTER — Emergency Department (HOSPITAL_BASED_OUTPATIENT_CLINIC_OR_DEPARTMENT_OTHER)

## 2022-11-05 ENCOUNTER — Other Ambulatory Visit: Payer: Self-pay

## 2022-11-05 ENCOUNTER — Encounter (HOSPITAL_BASED_OUTPATIENT_CLINIC_OR_DEPARTMENT_OTHER): Payer: Self-pay

## 2022-11-05 ENCOUNTER — Emergency Department (HOSPITAL_BASED_OUTPATIENT_CLINIC_OR_DEPARTMENT_OTHER)
Admission: EM | Admit: 2022-11-05 | Discharge: 2022-11-05 | Disposition: A | Discharge status: Home / Self Care | Attending: Emergency Medicine | Admitting: Emergency Medicine

## 2022-11-05 DIAGNOSIS — F0390 Unspecified dementia without behavioral disturbance: Secondary | ICD-10-CM | POA: Insufficient documentation

## 2022-11-05 DIAGNOSIS — Z91040 Latex allergy status: Secondary | ICD-10-CM | POA: Diagnosis not present

## 2022-11-05 DIAGNOSIS — L03115 Cellulitis of right lower limb: Secondary | ICD-10-CM | POA: Diagnosis not present

## 2022-11-05 DIAGNOSIS — Z7901 Long term (current) use of anticoagulants: Secondary | ICD-10-CM | POA: Insufficient documentation

## 2022-11-05 DIAGNOSIS — R102 Pelvic and perineal pain: Secondary | ICD-10-CM | POA: Diagnosis not present

## 2022-11-05 DIAGNOSIS — N3000 Acute cystitis without hematuria: Secondary | ICD-10-CM | POA: Diagnosis not present

## 2022-11-05 DIAGNOSIS — M79605 Pain in left leg: Secondary | ICD-10-CM | POA: Diagnosis present

## 2022-11-05 LAB — URINALYSIS, ROUTINE W REFLEX MICROSCOPIC
Bilirubin Urine: NEGATIVE
Glucose, UA: NEGATIVE mg/dL
Ketones, ur: NEGATIVE mg/dL
Nitrite: NEGATIVE
Specific Gravity, Urine: 1.018 (ref 1.005–1.030)
pH: 5.5 (ref 5.0–8.0)

## 2022-11-05 LAB — BASIC METABOLIC PANEL
Anion gap: 9 (ref 5–15)
BUN: 28 mg/dL — ABNORMAL HIGH (ref 8–23)
CO2: 24 mmol/L (ref 22–32)
Calcium: 9.1 mg/dL (ref 8.9–10.3)
Chloride: 100 mmol/L (ref 98–111)
Creatinine, Ser: 1.28 mg/dL — ABNORMAL HIGH (ref 0.61–1.24)
GFR, Estimated: 52 mL/min — ABNORMAL LOW (ref >60)
Glucose, Bld: 98 mg/dL (ref 70–99)
Potassium: 3.9 mmol/L (ref 3.5–5.1)
Sodium: 133 mmol/L — ABNORMAL LOW (ref 135–145)

## 2022-11-05 LAB — CBC WITH DIFFERENTIAL/PLATELET
Abs Immature Granulocytes: 0.09 10*3/uL — ABNORMAL HIGH (ref 0.00–0.07)
Basophils Absolute: 0 10*3/uL (ref 0.0–0.1)
Basophils Relative: 0 %
Eosinophils Absolute: 0 10*3/uL (ref 0.0–0.5)
Eosinophils Relative: 0 %
HCT: 29.4 % — ABNORMAL LOW (ref 39.0–52.0)
Hemoglobin: 10.2 g/dL — ABNORMAL LOW (ref 13.0–17.0)
Immature Granulocytes: 1 %
Lymphocytes Relative: 15 %
Lymphs Abs: 1.1 10*3/uL (ref 0.7–4.0)
MCH: 33.7 pg (ref 26.0–34.0)
MCHC: 34.7 g/dL (ref 30.0–36.0)
MCV: 97 fL (ref 80.0–100.0)
Monocytes Absolute: 0.8 10*3/uL (ref 0.1–1.0)
Monocytes Relative: 11 %
Neutro Abs: 5.2 10*3/uL (ref 1.7–7.7)
Neutrophils Relative %: 73 %
Platelets: 42 10*3/uL — ABNORMAL LOW (ref 150–400)
RBC: 3.03 MIL/uL — ABNORMAL LOW (ref 4.22–5.81)
RDW: 14.8 % (ref 11.5–15.5)
WBC: 7.3 10*3/uL (ref 4.0–10.5)
nRBC: 0 % (ref 0.0–0.2)

## 2022-11-05 MED ORDER — ACETAMINOPHEN 500 MG PO TABS
1000.0000 mg | ORAL_TABLET | Freq: Once | ORAL | Status: AC
  Administered 2022-11-05: 1000 mg via ORAL
  Filled 2022-11-05: qty 2

## 2022-11-05 MED ORDER — SODIUM CHLORIDE 0.9 % IV BOLUS
500.0000 mL | Freq: Once | INTRAVENOUS | Status: AC
  Administered 2022-11-05: 500 mL via INTRAVENOUS

## 2022-11-05 MED ORDER — CEPHALEXIN 500 MG PO CAPS: 500.0000 mg | ORAL_CAPSULE | Freq: Three times a day (TID) | ORAL | 0 refills | Status: AC

## 2022-11-05 MED ORDER — SODIUM CHLORIDE 0.9 % IV SOLN
1.0000 g | Freq: Once | INTRAVENOUS | Status: AC
  Administered 2022-11-05: 1 g via INTRAVENOUS
  Filled 2022-11-05: qty 10

## 2022-11-05 NOTE — Discharge Instructions (Addendum)
Please monitor Evan [name]'s rash.  If the redness is continuing to spread, or he begins having fevers, or worsening pain, return to the hospital.  We are also treating for a possible urinary infection.  The urine culture will take several days to result.  You should try to schedule a follow-up appointment with his doctor's office this week for a recheck of his symptoms and his culture results.

## 2022-11-05 NOTE — ED Provider Notes (Addendum)
[place] EMERGENCY DEPARTMENT AT [facility] Provider Note   CSN: [id number] Arrival date & time: 11/05/22  1134     History  Chief Complaint  Patient presents with   Urinary Tract Infection   Leg Pain    Evan [name] is a 89 y.o. male with history of chronic indwelling Foley, dementia, presenting from home in the company of his children with concern for mild worsening confusion, complaining of pain in his left leg.  His family members at bedside reported he noted a rash on the inside of his left thigh yesterday which appears to have started extending down in a streaking pattern into his left lower leg, the patient is complaining of pain around the site.  They are also concerned he may have a UTI which is caused some confusion in the past.  They report his Foley was last exchanged about 2 weeks ago.  He is not currently on antibiotics.  He is on home hospice, although the reasoning behind this is unclear according to the family members.  HPI     Home Medications Prior to Admission medications   Medication Sig Start Date End Date Taking? Authorizing Provider  cephALEXin (KEFLEX) 500 MG capsule Take 1 capsule (500 mg total) by mouth 3 (three) times daily for 9 days. 11/06/22 11/15/22 Yes , Kermit Balo, MD  acetaminophen (TYLENOL) 500 MG tablet Take 500 mg by mouth 2 (two) times daily as needed for mild pain, moderate pain, fever or headache.    [provider]  albuterol (PROVENTIL) (2.5 MG/3ML) 0.083% nebulizer solution Take 3 mLs (2.5 mg total) by nebulization every 4 (four) hours as needed for wheezing or shortness of breath. 05/17/22   [name], York Ram, MD  apixaban (ELIQUIS) 2.5 MG TABS tablet Take 1 tablet (2.5 mg total) by mouth 2 (two) times daily. 10/14/22   Gaston Islam., NP  Bismuth Tribromoph-Petrolatum (XEROFORM PETROLATUM DRES 4"X4") 3 % PADS Apply 1 application  topically daily. 09/22/22   Donita Brooks, MD  budesonide-formoterol (SYMBICORT)  160-4.5 MCG/ACT inhaler Inhale 2 puffs into the lungs in the morning and at bedtime. 05/17/22   [name], York Ram, MD  Cholecalciferol (VITAMIN D-3 PO) Take 1 capsule by mouth in the morning.    [provider]  ferrous sulfate (FEROSUL) 325 (65 FE) MG tablet TAKE 1 TABLET EVERY OTHER DAY 08/30/22   Donita Brooks, MD  finasteride (PROSCAR) 5 MG tablet Take 1 tablet (5 mg total) by mouth daily. 04/11/22   Donita Brooks, MD  FLUoxetine (PROZAC) 20 MG capsule TAKE 1 CAPSULE EVERY DAY Patient taking differently: Take 20 mg by mouth daily. 06/01/22   Donita Brooks, MD  folic acid (FOLVITE) 1 MG tablet Take 1 tablet (1 mg total) by mouth daily. 08/18/22   Donita Brooks, MD  furosemide (LASIX) 40 MG tablet Take 1 tablet (40 mg total) by mouth daily. 05/17/22 09/27/23  [name], York Ram, MD  Gauze Pads & Dressings [facility] FOAM DRESSNG/SACRAL) [phone]" PADS Apply daily at 6 (six) AM. 09/22/22   Pickard, Priscille Heidelberg, MD  Lifitegrast Benay Spice) 5 % SOLN Place 1 drop into both eyes 2 (two) times daily. Patient taking differently: Place 1 drop into both eyes as needed (dry eyes). 12/01/21     metoprolol tartrate (LOPRESSOR) 25 MG tablet Take 12.5 mg by mouth daily at 6 (six) AM. Can take an additional half tablet as needed for chest pain or elevated blood pressure    [provider]  Multiple Vitamins-Minerals (CENTRUM SILVER) tablet Take 1 tablet by mouth daily.    [provider]  pantoprazole (PROTONIX) 40 MG tablet TAKE 1 TABLET EVERY MORNING 07/01/22   Donita Brooks, MD  polyethylene glycol (MIRALAX / GLYCOLAX) 17 g packet Take 17 g by mouth daily as needed for mild constipation. 08/14/18   Angiulli, Mcarthur Rossetti, PA-C  rosuvastatin (CRESTOR) 5 MG tablet TAKE 1 TABLET EVERY DAY 10/24/22   Corky Crafts, MD  senna-docusate (STIMULANT LAXATIVE) 8.6-50 MG tablet Take 2 tablets by mouth 2 (two) times daily as needed for constipation 07/21/22     sodium fluoride (SF  5000 PLUS) 1.1 % CREA dental cream Brush on teeth for 3-5 minutes then spit once per day 09/13/22     sulfamethoxazole-trimethoprim (BACTRIM DS) 800-160 MG tablet Take 1 tablet by mouth 2 (two) times daily. Take with food 10/11/22     tacrolimus (PROTOPIC) 0.1 % ointment Apply 1 Application topically daily as needed (for itching). 02/08/19   [provider]  tamsulosin (FLOMAX) 0.4 MG CAPS capsule TAKE 1 CAPSULE EVERY DAY Patient taking differently: Take 0.4 mg by mouth daily with supper. 01/10/22   Donita Brooks, MD  Zinc Oxide (TRIPLE PASTE) 12.8 % ointment Apply topically 4 (four) times daily as needed (skin barrier). Patient taking differently: Apply 1 Application topically 2 (two) times daily as needed (skin barrier). 05/03/22   Zigmund Daniel., MD      Allergies    Neomycin-polymyxin-hc, Piroxicam, Statins, Diazepam, Doxycycline, Gatifloxacin, and Latex    Review of Systems   Review of Systems  Physical Exam Updated Vital Signs BP 105/60   Pulse (!) 55   Temp 97.9 F (36.6 C)   Resp 18   Ht 5\' 3"  (1.6 m)   Wt 66.2 kg   SpO2 100%   BMI 25.85 kg/m  Physical Exam Constitutional:      General: He is not in acute distress. HENT:     Head: Normocephalic and atraumatic.  Eyes:     Conjunctiva/sclera: Conjunctivae normal.     Pupils: Pupils are equal, round, and reactive to light.  Cardiovascular:     Rate and Rhythm: Normal rate and regular rhythm.  Pulmonary:     Effort: Pulmonary effort is normal. No respiratory distress.  Abdominal:     General: There is no distension.     Tenderness: There is no abdominal tenderness.  Genitourinary:    Comments: Indwelling Foley with cloudy urine Skin:    General: Skin is warm and dry.     Comments: Streaking tender erythema from the left medial thigh to approximate the left mid lower leg  Neurological:     General: No focal deficit present.     Mental Status: He is alert. Mental status is at baseline.  Psychiatric:         Mood and Affect: Mood normal.        Behavior: Behavior normal.     ED Results / Procedures / Treatments   Labs (all labs ordered are listed, but only abnormal results are displayed) Labs Reviewed  BASIC METABOLIC PANEL - Abnormal; Notable for the following components:      Result Value   Sodium 133 (*)    BUN 28 (*)    Creatinine, Ser 1.28 (*)    GFR, Estimated 52 (*)    All other components within normal limits  CBC WITH DIFFERENTIAL/PLATELET - Abnormal; Notable for the following components:  RBC 3.03 (*)    Hemoglobin 10.2 (*)    HCT 29.4 (*)    Platelets 42 (*)    Abs Immature Granulocytes 0.09 (*)    All other components within normal limits  URINALYSIS, ROUTINE W REFLEX MICROSCOPIC - Abnormal; Notable for the following components:   Hgb urine dipstick MODERATE (*)    Protein, ur TRACE (*)    Leukocytes,Ua LARGE (*)    Bacteria, UA RARE (*)    All other components within normal limits  URINE CULTURE    EKG None  Radiology US Venous Img Lower Unilateral Left  Result Date: 11/05/2022 CLINICAL DATA:  Left inguinal pain extending to the ankle with redness for 2 days EXAM: LEFT LOWER EXTREMITY VENOUS DOPPLER ULTRASOUND TECHNIQUE: Gray-scale sonography with compression, as well as color and duplex ultrasound, were performed to evaluate the deep venous system(s) from the level of the common femoral vein through the popliteal and proximal calf veins. COMPARISON:  None available FINDINGS: VENOUS Normal compressibility of the common femoral, superficial femoral, and popliteal veins, as well as the visualized calf veins. Visualized portions of profunda femoral vein and great saphenous vein unremarkable. No filling defects to suggest DVT on grayscale or color Doppler imaging. Doppler waveforms show normal direction of venous flow, normal respiratory plasticity and response to augmentation. Limited views of the contralateral common femoral vein are unremarkable. OTHER None.  Limitations: Suboptimal visualization of posterior tibial vein. Peroneal vein not visualized. IMPRESSION: No DVT of the left lower extremity. Electronically Signed   By: Acquanetta Belling M.D.   On: 11/05/2022 14:09    Procedures Procedures    Medications Ordered in ED Medications  acetaminophen (TYLENOL) tablet 1,000 mg (1,000 mg Oral Given 11/05/22 1439)  sodium chloride 0.9 % bolus 500 mL (0 mLs Intravenous Stopped 11/05/22 1540)  cefTRIAXone (ROCEPHIN) 1 g in sodium chloride 0.9 % 100 mL IVPB (1 g Intravenous New Bag/Given 11/05/22 1534)    ED Course/ Medical Decision Making/ A&P Clinical Course as of 11/05/22 1604  Sat Nov 05, 2022  1425 No urine in bladder with foley exchange and bladder scan, 500 cc fluid bolus ordered [MT]  1426 Patient has chronic thrombocytopenia, platelets near baseline level [MT]    Clinical Course User Index [MT] Terald Sleeper, MD                                 Medical Decision Making Amount and/or Complexity of Data Reviewed Labs: ordered.  Risk OTC drugs. Prescription drug management.   This patient presents to the ED with concern for cloudy urine, mild acute on chronic confusion, new painful rash. This involves an extensive number of treatment options, and is a complaint that carries with it a high risk of complications and morbidity.  The differential diagnosis includes UTI versus cellulitis versus DVT versus other  Co-morbidities that complicate the patient evaluation: Indwelling Foley at high risk of infection  Additional history obtained from patient's family members at the bedside  External records from outside source obtained and reviewed including discharge summary form 09/29/22 -the patient was noted to have a history of coronary disease status post CABG, systolic heart failure with an EF of 45%, esophageal ulcer, reflux, hypertension, hyperlipidemia, DVT/PE on Eliquis, RA, and thrombocytopenia.  Patient was admitted for chest pain with flat  troponins in the hospital at that time.  I ordered and personally interpreted labs.  The pertinent results include:  Cr at baseline 1.28, hgb at baseline 10.2, WBC wnl.  With large leukocytes, negative nitrites.  Urine culture to be sent.  I ordered imaging studies including DVT ultrasound I independently visualized and interpreted imaging which showed no acute DVT I agree with the radiologist interpretation  The patient was maintained on a cardiac monitor.  I ordered medication including IV rocephin for cellulitis and/or UTI  I have reviewed the patients home medicines and have made adjustments as needed  Dispostion:  Patient will be discharged with close follow-up as an outpatient.  I discussed with his family members who are his caretakers the plan for antibiotics for an additional 9 days to cross cover for potential complicated UTI as well as cellulitis.  They understand that his urine culture will take a few days to result.  They will monitor the progression of his rash, and if it continues to spread he will likely need return to the hospital potential admission.  But at this point I do not see evidence of sepsis, necrotizing fasciitis, or other severe infection to warrant hospitalization.  I was able to answer all the patient and family questions   Final Clinical Impression(s) / ED Diagnoses Final diagnoses:  Cellulitis of right lower extremity  Acute cystitis without hematuria    Rx / DC Orders ED Discharge Orders          Ordered    cephALEXin (KEFLEX) 500 MG capsule  3 times daily        11/05/22 1602              Terald Sleeper, MD 11/05/22 1515    Terald Sleeper, MD 11/05/22 [phone]

## 2022-11-05 NOTE — ED Notes (Signed)
 RN reviewed discharge instructions with pt. Pt verbalized understanding and had no further questions. VSS upon discharge.  

## 2022-11-05 NOTE — ED Triage Notes (Signed)
Patient arrives accompanied by family. Patient reports increased left leg pain and rash x1 day. Patient also has a foley catheter and family states that he may be developing a UTI (increased confusion at home).  Patient is currently on hospice as well.

## 2022-11-08 LAB — URINE CULTURE: Culture: 40000 — AB

## 2022-11-09 ENCOUNTER — Other Ambulatory Visit (HOSPITAL_COMMUNITY): Payer: Self-pay

## 2022-11-09 ENCOUNTER — Telehealth (HOSPITAL_BASED_OUTPATIENT_CLINIC_OR_DEPARTMENT_OTHER): Payer: Self-pay | Admitting: *Deleted

## 2022-11-09 NOTE — Progress Notes (Signed)
ED Antimicrobial Stewardship Positive Culture Follow Up   Evan [name] is an 89 y.o. male who presented to [facility] on 11/05/2022 with a chief complaint of  Chief Complaint  Patient presents with   Urinary Tract Infection   Leg Pain    Recent Results (from the past 720 hour(s))  Urine Culture     Status: Abnormal   Collection Time: 11/05/22  3:51 PM   Specimen: Urine, Clean Catch  Result Value Ref Range Status   Specimen Description   Final    URINE, CLEAN CATCH Performed at [facility], [address] [place], [place] [address]    Special Requests   Final    NONE Performed at [facility], [address] [place], [place] [address]    Culture (A)  Final    40,000 COLONIES/mL PSEUDOMONAS AERUGINOSA 20,000 COLONIES/mL ENTEROCOCCUS FAECALIS    Report Status 11/08/2022 FINAL  Final   Organism ID, Bacteria PSEUDOMONAS AERUGINOSA (A)  Final   Organism ID, Bacteria ENTEROCOCCUS FAECALIS (A)  Final      Susceptibility   Enterococcus faecalis - MIC*    AMPICILLIN <=2 SENSITIVE Sensitive     NITROFURANTOIN <=16 SENSITIVE Sensitive     VANCOMYCIN 1 SENSITIVE Sensitive     * 20,000 COLONIES/mL ENTEROCOCCUS FAECALIS   Pseudomonas aeruginosa - MIC*    CEFTAZIDIME 4 SENSITIVE Sensitive     CIPROFLOXACIN <=0.25 SENSITIVE Sensitive     GENTAMICIN 2 SENSITIVE Sensitive     IMIPENEM 1 SENSITIVE Sensitive     PIP/TAZO 8 SENSITIVE Sensitive     CEFEPIME 2 SENSITIVE Sensitive     * 40,000 COLONIES/mL PSEUDOMONAS AERUGINOSA    [x]  Treated with cephalexin, organisms on urine culture resistant to prescribed antimicrobial, may be colonization due to chronic foley catheter.  Plan: Contact patient/family to check on cellulitis/rash. If patient is improving, continue course of cephalexin. If not improving, recommend to follow up with PCP. Colony counts are low, not inclined to treat pseudomonas given chronic foley catheter.   ED Provider: Alvester Chou, MD   Romie Minus, PharmD PGY1 Pharmacy Resident  Monday - Friday phone -  [phone] Saturday - Sunday phone - [phone]

## 2022-11-09 NOTE — Telephone Encounter (Signed)
Post ED Visit - Positive Culture Follow-up: Successful Patient Follow-Up  Culture assessed and recommendations reviewed by:  []  Enzo Bi, Pharm.D. []  Celedonio Miyamoto, Pharm.D., BCPS AQ-ID []  Garvin Fila, Pharm.D., BCPS []  Georgina Pillion, Pharm.D., BCPS []  Culp, [address].D., BCPS, AAHIVP []  Estella Husk, Pharm.D., BCPS, AAHIVP []  Lysle Pearl, PharmD, BCPS []  Phillips Climes, PharmD, BCPS []  Agapito Games, PharmD, BCPS []  Verlan Friends, PharmD  Positive Urine culture    Changes discussed with ED provider: Lajuana Matte  Patient is improving per daughter Evan [name]. She states patient is not having any symptoms and cellulitis is looking better. Advise her to continue the course of cephalexin abx until complete.  Contacted patient, date 11/09/22, time 1045   Evan [name] 11/09/2022, 10:46 AM

## 2022-11-15 ENCOUNTER — Encounter: Payer: Self-pay | Admitting: Podiatry

## 2022-11-15 ENCOUNTER — Ambulatory Visit: Payer: Medicare HMO | Admitting: Podiatry

## 2022-11-15 ENCOUNTER — Other Ambulatory Visit (HOSPITAL_COMMUNITY): Payer: Self-pay

## 2022-11-15 DIAGNOSIS — Z7901 Long term (current) use of anticoagulants: Secondary | ICD-10-CM | POA: Diagnosis not present

## 2022-11-15 DIAGNOSIS — M79675 Pain in left toe(s): Secondary | ICD-10-CM

## 2022-11-15 DIAGNOSIS — B351 Tinea unguium: Secondary | ICD-10-CM

## 2022-11-15 DIAGNOSIS — M79674 Pain in right toe(s): Secondary | ICD-10-CM

## 2022-11-15 NOTE — Progress Notes (Signed)
  Subjective:  Patient ID: Evan [name], male    DOB: Jun 27, 1927,   MRN: [id number]  No chief complaint on file.   89 y.o. male presents for concern of thickened elongated and painful nails that are difficult to trim. Requesting to have them trimmed today. Relates burning and tingling in their feet. Patient with PVD and on blood thinner.   PCP:  Donita Brooks, MD    . Denies any other pedal complaints. Denies n/v/f/c.   Past Medical History:  Diagnosis Date   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   Anginal chest pain at rest    Chronic non, controlled on Lorazepam   Anxiety    Arthritis    "all over"    BPH (benign prostatic hyperplasia)    CAD (coronary artery disease)    Chronic lower back pain    Complication of anesthesia    "problems making water afterwards"   Depression    DJD (degenerative joint disease)    Esophageal ulcer without bleeding    bid ppi indefinitely   Family history of adverse reaction to anesthesia    "children w/PONV"   GERD (gastroesophageal reflux disease)    Headache    "couple/week maybe" (09/04/2015)   Hemochromatosis    Possible, elevated iron stores, hook-like osteophytes on hand films, normal LFTs   Hepatitis    "yellow jaundice as a baby"   History of ASCVD    MULTIVESSEL   Hyperlipidemia    Hypertension    Mild aortic stenosis 06/23/2021   Osteoarthritis    Pneumonia    "several times; got a little now" (09/04/2015)   Rheumatoid arthritis (HCC)    Subdural hematoma (HCC)    small after fall 09/2016-plavix held, neurosurgery consulted.  Asymptomatic.     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 he failed PO Eliquis    Objective:  Physical Exam: Vascular: DP/PT pulses 1/4 bilateral. CFT <3 seconds. Absent hair growth on digits. Edema noted to bilateral lower extremities. Xerosis noted bilaterally.  Skin. No lacerations or abrasions bilateral feet. Nails 1-5 bilateral  are thickened discolored and elongated  with subungual debris.  Musculoskeletal: MMT 5/5 bilateral lower extremities in DF, PF, Inversion and Eversion. Deceased ROM in DF of ankle joint.  Neurological: Sensation intact to light touch. Protective sensation diminished bilateral.    Assessment:   1. Pain due to onychomycosis of toenails of both feet   2. Chronic anticoagulation      Plan:  Patient was evaluated and treated and all questions answered. -Mechanically debrided all nails 1-5 bilateral using sterile nail nipper and filed with dremel without incident  -Answered all patient questions -Patient to return  in 3 months for at risk foot care -Patient advised to call the office if any problems or questions arise in the meantime.   Louann Sjogren, DPM

## 2022-11-22 ENCOUNTER — Other Ambulatory Visit (HOSPITAL_COMMUNITY): Payer: Self-pay

## 2022-11-24 ENCOUNTER — Encounter: Payer: Medicare HMO | Admitting: Pharmacist

## 2022-11-28 ENCOUNTER — Other Ambulatory Visit (HOSPITAL_COMMUNITY): Payer: Self-pay

## 2022-11-29 ENCOUNTER — Ambulatory Visit: Payer: Medicare HMO | Admitting: Internal Medicine

## 2022-11-30 ENCOUNTER — Other Ambulatory Visit: Payer: Self-pay

## 2022-11-30 ENCOUNTER — Telehealth: Payer: Self-pay | Admitting: Family Medicine

## 2022-11-30 DIAGNOSIS — I5021 Acute systolic (congestive) heart failure: Secondary | ICD-10-CM

## 2022-11-30 DIAGNOSIS — J438 Other emphysema: Secondary | ICD-10-CM

## 2022-11-30 MED ORDER — FUROSEMIDE 40 MG PO TABS: 40.0000 mg | ORAL_TABLET | Freq: Every day | ORAL | 1 refills | Status: DC

## 2022-11-30 NOTE — Telephone Encounter (Signed)
Prescription Request  11/30/2022  LOV: 09/22/2022  What is the name of the medication or equipment?   furosemide (LASIX) 40 MG tablet [[id number]]  **Pharmacy needs new 90 day script** **Patient's daughter called to request refill be sent in asap**  Have you contacted your pharmacy to request a refill? Yes   Which pharmacy would you like this sent to?  [facility] - [place], [place] - [address] [place] [address] Phone: [phone] Fax: [phone]    Patient notified that their request is being sent to the clinical staff for review and that they should receive a response within 2 business days.   Please fax new script to [phone] (fax number pharmacy gave Sonnie Alamo)

## 2022-12-08 ENCOUNTER — Ambulatory Visit: Payer: Medicare HMO | Admitting: Internal Medicine

## 2022-12-13 ENCOUNTER — Telehealth: Payer: Self-pay | Admitting: Family Medicine

## 2022-12-13 NOTE — Telephone Encounter (Signed)
Patient's daughter Sonnie Alamo called as advised by Hospice to request a referral for the patient to receive Home Health; requesting a male home health aid.   Please advise at [phone].

## 2022-12-16 ENCOUNTER — Other Ambulatory Visit: Payer: Self-pay | Admitting: Family Medicine

## 2022-12-16 DIAGNOSIS — I5022 Chronic systolic (congestive) heart failure: Secondary | ICD-10-CM

## 2022-12-16 NOTE — Telephone Encounter (Signed)
Called and spoke w/daughter and is aware of pcp's recommendation/msg.

## 2022-12-23 ENCOUNTER — Ambulatory Visit (INDEPENDENT_AMBULATORY_CARE_PROVIDER_SITE_OTHER): Payer: Medicare Other | Admitting: Family Medicine

## 2022-12-23 ENCOUNTER — Encounter: Payer: Self-pay | Admitting: Family Medicine

## 2022-12-23 VITALS — BP 120/70 | HR 89 | Temp 97.6°F | Ht 63.0 in | Wt 145.0 lb

## 2022-12-23 DIAGNOSIS — I5022 Chronic systolic (congestive) heart failure: Secondary | ICD-10-CM | POA: Diagnosis not present

## 2022-12-23 DIAGNOSIS — R531 Weakness: Secondary | ICD-10-CM

## 2022-12-23 DIAGNOSIS — R197 Diarrhea, unspecified: Secondary | ICD-10-CM | POA: Diagnosis not present

## 2022-12-23 DIAGNOSIS — I2782 Chronic pulmonary embolism: Secondary | ICD-10-CM

## 2022-12-23 NOTE — Progress Notes (Signed)
Subjective:   Patient was recently dismissed from hospice.  He has been doing quite well.  He has a history of pulmonary embolism diagnosed in February.  He has a history of adenocarcinoma of the right lung treated with radiation therapy in 2017.  He has a remote history of coronary artery disease.  He has a history of fall and intracranial hemorrhage however we have determined that he stays on lifelong anticoagulation due to his recent pulmonary embolism.  He has a chronic indwelling Foley catheter.  Recently was treated at the hospital for cellulitis in the left lower leg.  Has also been treated recently for urinary tract infection.  After the 2 different antibiotics, he has developed frequent watery diarrhea.  Family denies any fevers or chills or bloody diarrhea.  Patient denies any abdominal pain. Past Medical History:  Diagnosis Date   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   Anginal chest pain at rest    Chronic non, controlled on Lorazepam   Anxiety    Arthritis    "all over"    BPH (benign prostatic hyperplasia)    CAD (coronary artery disease)    Chronic lower back pain    Complication of anesthesia    "problems making water afterwards"   Depression    DJD (degenerative joint disease)    Esophageal ulcer without bleeding    bid ppi indefinitely   Family history of adverse reaction to anesthesia    "children w/PONV"   GERD (gastroesophageal reflux disease)    Headache    "couple/week maybe" (09/04/2015)   Hemochromatosis    Possible, elevated iron stores, hook-like osteophytes on hand films, normal LFTs   Hepatitis    "yellow jaundice as a baby"   History of ASCVD    MULTIVESSEL   Hyperlipidemia    Hypertension    Mild aortic stenosis 06/23/2021   Osteoarthritis    Pneumonia    "several times; got a little now" (09/04/2015)   Rheumatoid arthritis (HCC)    Subdural hematoma (HCC)    small after fall 09/2016-plavix held, neurosurgery consulted.  Asymptomatic.      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 he failed PO Eliquis   Past Surgical History:  Procedure Laterality Date   ARM SKIN LESION BIOPSY / EXCISION Left 09/02/2015   CATARACT EXTRACTION W/ INTRAOCULAR LENS  IMPLANT, BILATERAL Bilateral    CHOLECYSTECTOMY OPEN     CORNEAL TRANSPLANT Bilateral    "one at [facility]; one at [facility]"   CORONARY ANGIOPLASTY WITH STENT PLACEMENT     CORONARY ARTERY BYPASS GRAFT  1999   DESCENDING AORTIC ANEURYSM REPAIR W/ STENT     DE stent ostium into the right radial free graft at OM-, 02-2007   ESOPHAGOGASTRODUODENOSCOPY N/A 11/09/2014   Procedure: ESOPHAGOGASTRODUODENOSCOPY (EGD);  Surgeon: Dorena Cookey, MD;  Location: [facility] ENDOSCOPY;  Service: Endoscopy;  Laterality: N/A;   INGUINAL HERNIA REPAIR     JOINT REPLACEMENT     LEFT HEART CATH AND CORS/GRAFTS ANGIOGRAPHY N/A 06/27/2017   Procedure: LEFT HEART CATH AND CORS/GRAFTS ANGIOGRAPHY;  Surgeon: Lennette Bihari, MD;  Location: [facility] INVASIVE CV LAB;  Service: Cardiovascular;  Laterality: N/A;   NASAL SINUS SURGERY     SHOULDER OPEN ROTATOR CUFF REPAIR Right    TOTAL KNEE ARTHROPLASTY Bilateral    Current Outpatient Medications on File Prior to Visit  Medication Sig Dispense Refill   acetaminophen (TYLENOL) 500 MG tablet Take 500 mg by mouth 2 (two) times daily as needed  for mild pain, moderate pain, fever or headache.     albuterol (PROVENTIL) (2.5 MG/3ML) 0.083% nebulizer solution Take 3 mLs (2.5 mg total) by nebulization every 4 (four) hours as needed for wheezing or shortness of breath. 75 mL 12   apixaban (ELIQUIS) 2.5 MG TABS tablet Take 1 tablet (2.5 mg total) by mouth 2 (two) times daily. 180 tablet 3   Bismuth Tribromoph-Petrolatum (XEROFORM PETROLATUM DRES 4"X4") 3 % PADS Apply 1 application  topically daily. 25 each 3   budesonide-formoterol (SYMBICORT) 160-4.5 MCG/ACT inhaler Inhale 2 puffs into the lungs in the morning and at bedtime. 10.2 g 12   Cholecalciferol (VITAMIN D-3 PO) Take 1  capsule by mouth in the morning.     ferrous sulfate (FEROSUL) 325 (65 FE) MG tablet TAKE 1 TABLET EVERY OTHER DAY 90 tablet 3   finasteride (PROSCAR) 5 MG tablet Take 1 tablet (5 mg total) by mouth daily. 90 tablet 3   FLUoxetine (PROZAC) 20 MG capsule TAKE 1 CAPSULE EVERY DAY (Patient taking differently: Take 20 mg by mouth daily.) 90 capsule 3   folic acid (FOLVITE) 1 MG tablet Take 1 tablet (1 mg total) by mouth daily. 30 tablet 2   furosemide (LASIX) 40 MG tablet Take 1 tablet (40 mg total) by mouth daily. 90 tablet 1   Gauze Pads & Dressings (SILIGENTLE FOAM DRESSNG/SACRAL) 7"X7" PADS Apply daily at 6 (six) AM. 5 each 11   Lifitegrast (XIIDRA) 5 % SOLN Place 1 drop into both eyes 2 (two) times daily. (Patient taking differently: Place 1 drop into both eyes as needed (dry eyes).) 60 each 12   metoprolol tartrate (LOPRESSOR) 25 MG tablet Take 12.5 mg by mouth daily at 6 (six) AM. Can take an additional half tablet as needed for chest pain or elevated blood pressure     Multiple Vitamins-Minerals (CENTRUM SILVER) tablet Take 1 tablet by mouth daily.     pantoprazole (PROTONIX) 40 MG tablet TAKE 1 TABLET EVERY MORNING 90 tablet 2   polyethylene glycol (MIRALAX / GLYCOLAX) 17 g packet Take 17 g by mouth daily as needed for mild constipation. 14 each 0   rosuvastatin (CRESTOR) 5 MG tablet TAKE 1 TABLET EVERY DAY 30 tablet 11   senna-docusate (STIMULANT LAXATIVE) 8.6-50 MG tablet Take 2 tablets by mouth 2 (two) times daily as needed for constipation 60 tablet 2   sodium fluoride (SF 5000 PLUS) 1.1 % CREA dental cream Brush on teeth for 3-5 minutes then spit once per day 51 g 1   sulfamethoxazole-trimethoprim (BACTRIM DS) 800-160 MG tablet Take 1 tablet by mouth 2 (two) times daily. Take with food 28 tablet 0   tacrolimus (PROTOPIC) 0.1 % ointment Apply 1 Application topically daily as needed (for itching).     tamsulosin (FLOMAX) 0.4 MG CAPS capsule TAKE 1 CAPSULE EVERY DAY (Patient taking  differently: Take 0.4 mg by mouth daily with supper.) 90 capsule 10   Zinc Oxide (TRIPLE PASTE) 12.8 % ointment Apply topically 4 (four) times daily as needed (skin barrier). (Patient taking differently: Apply 1 Application topically 2 (two) times daily as needed (skin barrier).) 56.7 g 0   No current facility-administered medications on file prior to visit.   Allergies  Allergen Reactions   Neomycin-Polymyxin-Hc Itching   Piroxicam Rash and Other (See Comments)    Blistering rash     Statins Other (See Comments)    Myalgias   Diazepam Other (See Comments)    Dizziness  REACTION:  unknown, REACTION:  unknown   Doxycycline Nausea Only, Itching and Other (See Comments)    REACTION:  unknown, REACTION:  unknown   Gatifloxacin Anxiety and Swelling   Latex Rash    Band Aid   Social History   Socioeconomic History   Marital status: Widowed    Spouse name: Not on file   Number of children: 2   Years of education: 75   Highest education level: 12th grade  Occupational History   Not on file  Tobacco Use   Smoking status: Former    Types: Cigarettes    Passive exposure: Yes   Smokeless tobacco: Former    Types: Chew   Tobacco comments:    "quit chewing in the 1960s"    Verified by Daughter, [name]  Vaping Use   Vaping status: Never Used  Substance and Sexual Activity   Alcohol use: No   Drug use: No   Sexual activity: Never  Other Topics Concern   Not on file  Social History Narrative   Not on file   Social Determinants of Health   Financial Resource Strain: Low Risk  (07/25/2022)   Overall Financial Resource Strain (CARDIA)    Difficulty of Paying Living Expenses: Not hard at all  Food Insecurity: No Food Insecurity (09/29/2022)   Hunger Vital Sign    Worried About Running Out of Food in the Last Year: Never true    Ran Out of Food in the Last Year: Never true  Transportation Needs: No Transportation Needs (09/29/2022)   PRAPARE - Doctor, general practice (Medical): No    Lack of Transportation (Non-Medical): No  Physical Activity: Inactive (06/30/2022)   Exercise Vital Sign    Days of Exercise per Week: 0 days    Minutes of Exercise per Session: 0 min  Stress: No Stress Concern Present (07/25/2022)   [employer] of Occupational Health - Occupational Stress Questionnaire    Feeling of Stress : Only a little  Social Connections: Moderately Isolated (06/30/2022)   Social Connection and Isolation Panel [NHANES]    Frequency of Communication with Friends and Family: More than three times a week    Frequency of Social Gatherings with Friends and Family: More than three times a week    Attends Religious Services: 1 to 4 times per year    Active Member of [employer] or Organizations: No    Attends Banker Meetings: Never    Marital Status: Widowed  Intimate Partner Violence: Not At Risk (09/29/2022)   Humiliation, Afraid, Rape, and Kick questionnaire    Fear of Current or Ex-Partner: No    Emotionally Abused: No    Physically Abused: No    Sexually Abused: No      Review of Systems     Objective:   Physical Exam Vitals reviewed.  Constitutional:      General: He is not in acute distress.    Appearance: Normal appearance. He is not toxic-appearing or diaphoretic.  Cardiovascular:     Rate and Rhythm: Normal rate and regular rhythm.     Heart sounds: Normal heart sounds. No murmur heard.    No friction rub. No gallop.  Pulmonary:     Effort: Pulmonary effort is normal. No respiratory distress.     Breath sounds: Rales present. No wheezing or rhonchi.    Musculoskeletal:     Right lower leg: No edema.     Left lower leg: No edema.  Neurological:  Mental Status: He is alert.   Right-sided hemiparesis with weakness in his right leg.  Weakness with knee flexion and extension and ankle flexion and extension.  Ataxia in the right upper arm and right lower leg.         Assessment & Plan:   Diarrhea, unspecified type - Plan: C difficile Toxins A+B W/Rflx  Chronic systolic congestive heart failure (HCC)  Chronic pulmonary embolism without acute cor pulmonale, unspecified pulmonary embolism type (HCC)  Weakness generalized I reviewed the lab work recently obtained at the hospital this summer.  I will not repeat lab work today.  Patient seems to be doing quite well.  Family would like to apply for home health to assist in bathing once a week as he has been discharged from hospice.  I will certainly request this.  Schedule patient for home health.  Check for C. difficile given recent antibiotic use.  If C. difficile is negative, I recommend starting a probiotic daily to help with the post antibiotic diarrhea.

## 2022-12-26 ENCOUNTER — Telehealth: Payer: Self-pay

## 2022-12-26 DIAGNOSIS — R197 Diarrhea, unspecified: Secondary | ICD-10-CM | POA: Diagnosis not present

## 2022-12-26 NOTE — Telephone Encounter (Signed)
Pt's daughter came in to ask if pcp would write a prescription for pt to get a hospital bed and a air overlay mattress for hospital bed. Please call daughter when this is available to pick up. Please advise  Cb#: [phone]

## 2022-12-26 NOTE — Addendum Note (Signed)
Addended by: Darral Dash on: 12/26/2022 03:31 PM   Modules accepted: Orders

## 2022-12-27 LAB — C. DIFFICILE GDH AND TOXIN A/B
GDH ANTIGEN: NOT DETECTED
MICRO NUMBER:: 15504754
SPECIMEN QUALITY:: ADEQUATE
TOXIN A AND B: NOT DETECTED

## 2022-12-27 NOTE — Telephone Encounter (Signed)
Patient's daughter called to follow up on script requested; stated she's unsure of where to send it. As per nurse, script will be faxed to [facility] at [phone].

## 2022-12-27 NOTE — Telephone Encounter (Signed)
Received call from Amy with [facility] to follow up on referral received for hospital bed and air mattress topper; requesting for [place] to send her the following:  - face to face notes -  narrative - demographics   Please fax to [phone], attn: Amy  Please call with any questions or concerns at [phone].

## 2022-12-29 ENCOUNTER — Other Ambulatory Visit (HOSPITAL_COMMUNITY): Payer: Self-pay

## 2022-12-29 ENCOUNTER — Ambulatory Visit: Payer: Medicare HMO | Admitting: Family Medicine

## 2022-12-29 DIAGNOSIS — I10 Essential (primary) hypertension: Secondary | ICD-10-CM | POA: Diagnosis not present

## 2022-12-29 DIAGNOSIS — Z961 Presence of intraocular lens: Secondary | ICD-10-CM | POA: Diagnosis not present

## 2022-12-29 DIAGNOSIS — H35033 Hypertensive retinopathy, bilateral: Secondary | ICD-10-CM | POA: Diagnosis not present

## 2022-12-29 DIAGNOSIS — H524 Presbyopia: Secondary | ICD-10-CM | POA: Diagnosis not present

## 2022-12-29 DIAGNOSIS — H16223 Keratoconjunctivitis sicca, not specified as Sjogren's, bilateral: Secondary | ICD-10-CM | POA: Diagnosis not present

## 2022-12-29 DIAGNOSIS — Z947 Corneal transplant status: Secondary | ICD-10-CM | POA: Diagnosis not present

## 2022-12-29 DIAGNOSIS — H353131 Nonexudative age-related macular degeneration, bilateral, early dry stage: Secondary | ICD-10-CM | POA: Diagnosis not present

## 2022-12-30 DIAGNOSIS — N3000 Acute cystitis without hematuria: Secondary | ICD-10-CM | POA: Diagnosis not present

## 2022-12-30 DIAGNOSIS — R3914 Feeling of incomplete bladder emptying: Secondary | ICD-10-CM | POA: Diagnosis not present

## 2022-12-31 ENCOUNTER — Other Ambulatory Visit (HOSPITAL_COMMUNITY): Payer: Self-pay

## 2023-01-02 ENCOUNTER — Other Ambulatory Visit (HOSPITAL_COMMUNITY): Payer: Self-pay

## 2023-01-04 ENCOUNTER — Ambulatory Visit: Payer: Medicare HMO | Admitting: Internal Medicine

## 2023-01-06 DIAGNOSIS — E785 Hyperlipidemia, unspecified: Secondary | ICD-10-CM | POA: Diagnosis not present

## 2023-01-06 DIAGNOSIS — M1990 Unspecified osteoarthritis, unspecified site: Secondary | ICD-10-CM | POA: Diagnosis not present

## 2023-01-06 DIAGNOSIS — I5032 Chronic diastolic (congestive) heart failure: Secondary | ICD-10-CM | POA: Diagnosis not present

## 2023-01-06 DIAGNOSIS — I110 Hypertensive heart disease with heart failure: Secondary | ICD-10-CM | POA: Diagnosis not present

## 2023-01-06 DIAGNOSIS — I2782 Chronic pulmonary embolism: Secondary | ICD-10-CM | POA: Diagnosis not present

## 2023-01-06 DIAGNOSIS — J4489 Other specified chronic obstructive pulmonary disease: Secondary | ICD-10-CM | POA: Diagnosis not present

## 2023-01-06 DIAGNOSIS — F419 Anxiety disorder, unspecified: Secondary | ICD-10-CM | POA: Diagnosis not present

## 2023-01-06 DIAGNOSIS — I69351 Hemiplegia and hemiparesis following cerebral infarction affecting right dominant side: Secondary | ICD-10-CM | POA: Diagnosis not present

## 2023-01-06 DIAGNOSIS — I2510 Atherosclerotic heart disease of native coronary artery without angina pectoris: Secondary | ICD-10-CM | POA: Diagnosis not present

## 2023-01-09 NOTE — Progress Notes (Deleted)
Cardiology Office Note:    Date:  01/09/2023  ID:  Evan [name], DOB Apr 04, 1983, MRN [id number] PCP: Donita Brooks, MD  [facility] Providers Cardiologist:  Lance Muss, MD { Click to update primary MD,subspecialty MD or APP then REFRESH:1}    {Click to Open Review  :1}   Patient Profile:      Coronary artery disease S/p CABG in 1998 S/p Taxus DES to the LCx in 2008 and Taxus DES to the free radial-OM1 LHC 06/30/17: Patent stent in the free radial-OM1, patent stent in the LCx, patent LIMA-LAD, mild plaque in the RCA Admx 09/2022 w [place] (hsT mild elevated, flat not c/w NSTEMI)>>med Rx (not a candidate for cath 2/2 dementia) HFmrEF (heart failure with mildly reduced ejection fraction)  Previously HFpEF  TTE 11/21/18: EF 60-65 TTE 04/13/21:  Lateral HK, EF 45-50, mild LVH, mildly reduced RVSF, normal PASP, moderate BAE, mild MR, mild aortic stenosis (mean gradient 7, DI 0.42)  Limited TTE 04/28/22: EF 50-55, mildly reduced RVSF, mod RVE, mod elevated PASP (RVSP 49.3), mod LAE, trivial MR, RAP 8 Atrial fibrillation/flutter Rx with Lovenox after DVT anticoagulation DC'd after intracranial hemorrhage Restarted on anticoagulation with Xarelto in 2020 Aortic stenosis Echo 1/23: Mild with mean gradient 7 Hypertension Hyperlipidemia Chronic kidney disease (GFR 52) Lung CA Hx DVT in 11/19 Hx of pulmonary embolism in 04/2022 Hemochromatosis Thrombocytopenia  GERD Aortic atherosclerosis Hx CVA Hx intracranial hemorrhage Subdural hematoma after a fall in 6/18 Gout Compression fracture        {      :1}   History of Present Illness:  Discussed the use of AI scribe software for clinical note transcription with the patient, who gave verbal consent to proceed.  Evan [name] is a 89 y.o. male who returns for follow up of CAD, CHF, AFib, AS. He was last seen in 10/2022 by Robin Searing, NP.             ROS   See HPI ***    Studies Reviewed:       *** Risk  Assessment/Calculations:   {Does this patient have ATRIAL FIBRILLATION?:[phone]} No BP recorded.  {Refresh Note OR Click here to enter BP  :1}***       Physical Exam:   VS:  There were no vitals taken for this visit.   Wt Readings from Last 3 Encounters:  12/23/22 145 lb (65.8 kg)  11/05/22 145 lb 15.1 oz (66.2 kg)  10/14/22 146 lb (66.2 kg)    Physical Exam***     Assessment and Plan:   Assessment & Plan Coronary artery disease involving native coronary artery of native heart without angina pectoris  HFmrEF (heart failure with mildly reduced ejection fraction) (HCC)  Permanent atrial fibrillation (HCC)  Mild aortic stenosis   Assessment and Plan             {   HFmrEF (heart failure with mildly reduced ejection fraction) (HCC) EF 45-50.  Volume status seems stable with recent adjustment in Lasix.  He has felt better since stopping the Entresto.  His BP is still somewhat low and I am hesitant to try to challenge him with low dose ACE/ARB.  He and his daughter agree and prefer to not change meds at this point.  Continue Lasix 40 mg once daily except 20 mg 2 days a week, metoprolol tartrate 12.5 mg once daily.  We can continue to consider ACE/ARB at f/u if BP runs higher.  F/u with Dr.  Varanasi or me in 3-4 mos.    Coronary artery disease involving native coronary artery of native heart without angina pectoris History of CABG in 1998 and PCI to the LCx and radial to OM1 in 2008.  He had stable anatomy in 2019 by cardiac catheterization.  He is not having symptoms of angina.  Continue metoprolol tartrate 12.5 mg once daily, Crestor 5 mg once daily.     Permanent atrial fibrillation (HCC) Rate is controlled on exam.  CrCl 35.  Continue Xarelto 15 mg once daily.    :1}    {Are you ordering a CV Procedure (e.g. stress test, cath, DCCV, TEE, etc)?   Press F2        :[id number]}  Dispo:  No follow-ups on file.  Signed, Tereso Newcomer, PA-C

## 2023-01-10 ENCOUNTER — Ambulatory Visit: Payer: Medicare Other | Attending: Physician Assistant | Admitting: Physician Assistant

## 2023-01-10 DIAGNOSIS — I5020 Unspecified systolic (congestive) heart failure: Secondary | ICD-10-CM

## 2023-01-10 DIAGNOSIS — I4821 Permanent atrial fibrillation: Secondary | ICD-10-CM

## 2023-01-10 DIAGNOSIS — I350 Nonrheumatic aortic (valve) stenosis: Secondary | ICD-10-CM

## 2023-01-10 DIAGNOSIS — I2510 Atherosclerotic heart disease of native coronary artery without angina pectoris: Secondary | ICD-10-CM

## 2023-01-11 DIAGNOSIS — E785 Hyperlipidemia, unspecified: Secondary | ICD-10-CM | POA: Diagnosis not present

## 2023-01-11 DIAGNOSIS — I5032 Chronic diastolic (congestive) heart failure: Secondary | ICD-10-CM | POA: Diagnosis not present

## 2023-01-11 DIAGNOSIS — I2782 Chronic pulmonary embolism: Secondary | ICD-10-CM | POA: Diagnosis not present

## 2023-01-11 DIAGNOSIS — I2510 Atherosclerotic heart disease of native coronary artery without angina pectoris: Secondary | ICD-10-CM | POA: Diagnosis not present

## 2023-01-11 DIAGNOSIS — J4489 Other specified chronic obstructive pulmonary disease: Secondary | ICD-10-CM | POA: Diagnosis not present

## 2023-01-11 DIAGNOSIS — I110 Hypertensive heart disease with heart failure: Secondary | ICD-10-CM | POA: Diagnosis not present

## 2023-01-11 DIAGNOSIS — F419 Anxiety disorder, unspecified: Secondary | ICD-10-CM | POA: Diagnosis not present

## 2023-01-11 DIAGNOSIS — M1990 Unspecified osteoarthritis, unspecified site: Secondary | ICD-10-CM | POA: Diagnosis not present

## 2023-01-11 DIAGNOSIS — I69351 Hemiplegia and hemiparesis following cerebral infarction affecting right dominant side: Secondary | ICD-10-CM | POA: Diagnosis not present

## 2023-01-12 ENCOUNTER — Telehealth: Payer: Self-pay

## 2023-01-12 DIAGNOSIS — J4489 Other specified chronic obstructive pulmonary disease: Secondary | ICD-10-CM | POA: Diagnosis not present

## 2023-01-12 DIAGNOSIS — I2782 Chronic pulmonary embolism: Secondary | ICD-10-CM | POA: Diagnosis not present

## 2023-01-12 DIAGNOSIS — E785 Hyperlipidemia, unspecified: Secondary | ICD-10-CM | POA: Diagnosis not present

## 2023-01-12 DIAGNOSIS — I69351 Hemiplegia and hemiparesis following cerebral infarction affecting right dominant side: Secondary | ICD-10-CM | POA: Diagnosis not present

## 2023-01-12 DIAGNOSIS — I5032 Chronic diastolic (congestive) heart failure: Secondary | ICD-10-CM | POA: Diagnosis not present

## 2023-01-12 DIAGNOSIS — I2510 Atherosclerotic heart disease of native coronary artery without angina pectoris: Secondary | ICD-10-CM | POA: Diagnosis not present

## 2023-01-12 DIAGNOSIS — F419 Anxiety disorder, unspecified: Secondary | ICD-10-CM | POA: Diagnosis not present

## 2023-01-12 DIAGNOSIS — M1990 Unspecified osteoarthritis, unspecified site: Secondary | ICD-10-CM | POA: Diagnosis not present

## 2023-01-12 DIAGNOSIS — I110 Hypertensive heart disease with heart failure: Secondary | ICD-10-CM | POA: Diagnosis not present

## 2023-01-12 NOTE — Telephone Encounter (Signed)
Joey PT with [facility] called in to get VO for PT for this pt as follows:  HH PT for 1xw for 8 weeks for endurance and balance.  Please call [phone] may lvm/secure line

## 2023-01-13 ENCOUNTER — Telehealth: Payer: Self-pay

## 2023-01-13 NOTE — Telephone Encounter (Signed)
Malorie PT with [facility], called in to get VO for pt as follows:   Pt was not able to to initial assessment needing an order for assessment to moved to next week.  Please call Malorie at [phone]

## 2023-01-18 ENCOUNTER — Telehealth: Payer: Self-pay

## 2023-01-18 DIAGNOSIS — E785 Hyperlipidemia, unspecified: Secondary | ICD-10-CM | POA: Diagnosis not present

## 2023-01-18 DIAGNOSIS — I5032 Chronic diastolic (congestive) heart failure: Secondary | ICD-10-CM | POA: Diagnosis not present

## 2023-01-18 DIAGNOSIS — F419 Anxiety disorder, unspecified: Secondary | ICD-10-CM | POA: Diagnosis not present

## 2023-01-18 DIAGNOSIS — I69351 Hemiplegia and hemiparesis following cerebral infarction affecting right dominant side: Secondary | ICD-10-CM | POA: Diagnosis not present

## 2023-01-18 DIAGNOSIS — M1990 Unspecified osteoarthritis, unspecified site: Secondary | ICD-10-CM | POA: Diagnosis not present

## 2023-01-18 DIAGNOSIS — I2510 Atherosclerotic heart disease of native coronary artery without angina pectoris: Secondary | ICD-10-CM | POA: Diagnosis not present

## 2023-01-18 DIAGNOSIS — J4489 Other specified chronic obstructive pulmonary disease: Secondary | ICD-10-CM | POA: Diagnosis not present

## 2023-01-18 DIAGNOSIS — I2782 Chronic pulmonary embolism: Secondary | ICD-10-CM | POA: Diagnosis not present

## 2023-01-18 DIAGNOSIS — I110 Hypertensive heart disease with heart failure: Secondary | ICD-10-CM | POA: Diagnosis not present

## 2023-01-18 NOTE — Telephone Encounter (Signed)
Malorie OT with [facility] called in to get VO for this pt as follows:  HH OT 1xw for 5 weeks   Please call Malorie with [facility] at [phone]

## 2023-01-20 DIAGNOSIS — M1990 Unspecified osteoarthritis, unspecified site: Secondary | ICD-10-CM | POA: Diagnosis not present

## 2023-01-20 DIAGNOSIS — E785 Hyperlipidemia, unspecified: Secondary | ICD-10-CM | POA: Diagnosis not present

## 2023-01-20 DIAGNOSIS — J4489 Other specified chronic obstructive pulmonary disease: Secondary | ICD-10-CM | POA: Diagnosis not present

## 2023-01-20 DIAGNOSIS — F419 Anxiety disorder, unspecified: Secondary | ICD-10-CM | POA: Diagnosis not present

## 2023-01-20 DIAGNOSIS — I69351 Hemiplegia and hemiparesis following cerebral infarction affecting right dominant side: Secondary | ICD-10-CM | POA: Diagnosis not present

## 2023-01-20 DIAGNOSIS — I2782 Chronic pulmonary embolism: Secondary | ICD-10-CM | POA: Diagnosis not present

## 2023-01-20 DIAGNOSIS — I110 Hypertensive heart disease with heart failure: Secondary | ICD-10-CM | POA: Diagnosis not present

## 2023-01-20 DIAGNOSIS — I5032 Chronic diastolic (congestive) heart failure: Secondary | ICD-10-CM | POA: Diagnosis not present

## 2023-01-20 DIAGNOSIS — I2510 Atherosclerotic heart disease of native coronary artery without angina pectoris: Secondary | ICD-10-CM | POA: Diagnosis not present

## 2023-01-23 ENCOUNTER — Other Ambulatory Visit: Payer: Self-pay | Admitting: Family Medicine

## 2023-01-24 ENCOUNTER — Encounter: Payer: Self-pay | Admitting: Internal Medicine

## 2023-01-24 ENCOUNTER — Ambulatory Visit: Payer: Medicare HMO | Admitting: Internal Medicine

## 2023-01-24 ENCOUNTER — Other Ambulatory Visit: Payer: Self-pay

## 2023-01-24 VITALS — BP 86/50 | HR 72 | Temp 97.1°F | Resp 16

## 2023-01-24 DIAGNOSIS — R8271 Bacteriuria: Secondary | ICD-10-CM | POA: Diagnosis not present

## 2023-01-24 DIAGNOSIS — Z87440 Personal history of urinary (tract) infections: Secondary | ICD-10-CM

## 2023-01-24 NOTE — Progress Notes (Signed)
[facility]      Reason for Consult:history of recurrent UTIs    Referring Physician: Dr. Annabell Howells    Patient ID: Evan [name], male    DOB: March 26, 1928, 89 y.o.   MRN: [id number]  HPI:   Evan [name] is here with his daughter for evaluation of a concern for recurrent UTIs. He has a history of dementia, recently on hospice and urinary retention with a chronic Foley catheter in place here for evaluation of above.  The daughter provides most of the history.  She reports symptoms of odor, some feeling of needing to urinate despite the catheter and change in color.  She then takes him in for evaluation and told he has a UTI.  No report of fever, chills or any burning.  Also with a history of CAD s/p CABG and decreased EF on furosemide and history of PE on Eliquis.    Past Medical History:  Diagnosis Date   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   Anginal chest pain at rest    Chronic non, controlled on Lorazepam   Anxiety    Arthritis    "all over"    BPH (benign prostatic hyperplasia)    CAD (coronary artery disease)    Chronic lower back pain    Complication of anesthesia    "problems making water afterwards"   Depression    DJD (degenerative joint disease)    Esophageal ulcer without bleeding    bid ppi indefinitely   Family history of adverse reaction to anesthesia    "children w/PONV"   GERD (gastroesophageal reflux disease)    Headache    "couple/week maybe" (09/04/2015)   Hemochromatosis    Possible, elevated iron stores, hook-like osteophytes on hand films, normal LFTs   Hepatitis    "yellow jaundice as a baby"   History of ASCVD    MULTIVESSEL   Hyperlipidemia    Hypertension    Mild aortic stenosis 06/23/2021   Osteoarthritis    Pneumonia    "several times; got a little now" (09/04/2015)   Rheumatoid arthritis (HCC)    Subdural hematoma (HCC)    small after fall 09/2016-plavix held, neurosurgery consulted.  Asymptomatic.      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 he failed PO Eliquis    Prior to Admission medications   Medication Sig Start Date End Date Taking? Authorizing Provider  acetaminophen (TYLENOL) 500 MG tablet Take 500 mg by mouth 2 (two) times daily as needed for mild pain, moderate pain, fever or headache.   Yes [provider]  apixaban (ELIQUIS) 2.5 MG TABS tablet Take 1 tablet (2.5 mg total) by mouth 2 (two) times daily. 10/14/22  Yes Gaston Islam., NP  budesonide-formoterol [facility]) 160-4.5 MCG/ACT inhaler Inhale 2 puffs into the lungs in the morning and at bedtime. 05/17/22  Yes [name], York Ram, MD  Cholecalciferol (VITAMIN D-3 PO) Take 1 capsule by mouth in the morning.   Yes [provider]  ferrous sulfate (FEROSUL) 325 (65 FE) MG tablet TAKE 1 TABLET EVERY OTHER DAY 08/30/22  Yes Donita Brooks, MD  finasteride (PROSCAR) 5 MG tablet Take 1 tablet (5 mg total) by mouth daily. 04/11/22  Yes Donita Brooks, MD  FLUoxetine (PROZAC) 20 MG capsule TAKE 1 CAPSULE EVERY DAY Patient taking differently: Take 20 mg by mouth daily. 06/01/22  Yes Donita Brooks, MD  folic acid (FOLVITE) 1 MG tablet Take 1 tablet (1  mg total) by mouth daily. 08/18/22  Yes Donita Brooks, MD  furosemide (LASIX) 40 MG tablet Take 1 tablet (40 mg total) by mouth daily. 11/30/22 01/24/23 Yes Donita Brooks, MD  Gauze Pads & Dressings (SILIGENTLE FOAM DRESSNG/SACRAL) [phone]" PADS Apply daily at 6 (six) AM. 09/22/22  Yes Pickard, Priscille Heidelberg, MD  metoprolol tartrate (LOPRESSOR) 25 MG tablet Take 12.5 mg by mouth daily at 6 (six) AM. Can take an additional half tablet as needed for chest pain or elevated blood pressure   Yes [provider]  Multiple Vitamins-Minerals (CENTRUM SILVER) tablet Take 1 tablet by mouth daily.   Yes [provider]  pantoprazole (PROTONIX) 40 MG tablet TAKE 1 TABLET EVERY MORNING 07/01/22  Yes Donita Brooks, MD  polyethylene glycol  (MIRALAX / GLYCOLAX) 17 g packet Take 17 g by mouth daily as needed for mild constipation. 08/14/18  Yes Angiulli, Mcarthur Rossetti, PA-C  rosuvastatin (CRESTOR) 5 MG tablet TAKE 1 TABLET EVERY DAY 10/24/22  Yes Corky Crafts, MD  senna-docusate (STIMULANT LAXATIVE) 8.6-50 MG tablet Take 2 tablets by mouth 2 (two) times daily as needed for constipation 07/21/22  Yes   sodium fluoride (SF 5000 PLUS) 1.1 % CREA dental cream Brush on teeth for 3-5 minutes then spit once per day 09/13/22  Yes   tacrolimus (PROTOPIC) 0.1 % ointment Apply 1 Application topically daily as needed (for itching). 02/08/19  Yes [provider]  tamsulosin (FLOMAX) 0.4 MG CAPS capsule TAKE 1 CAPSULE EVERY DAY Patient taking differently: Take 0.4 mg by mouth daily with supper. 01/10/22  Yes Donita Brooks, MD  Zinc Oxide (TRIPLE PASTE) 12.8 % ointment Apply topically 4 (four) times daily as needed (skin barrier). Patient taking differently: Apply 1 Application topically 2 (two) times daily as needed (skin barrier). 05/03/22  Yes Zigmund Daniel., MD  albuterol (PROVENTIL) (2.5 MG/3ML) 0.083% nebulizer solution Take 3 mLs (2.5 mg total) by nebulization every 4 (four) hours as needed for wheezing or shortness of breath. Patient not taking: Reported on 01/24/2023 05/17/22   [name], York Ram, MD  Bismuth Tribromoph-Petrolatum (XEROFORM PETROLATUM DRES 4"X4") 3 % PADS Apply 1 application  topically daily. Patient not taking: Reported on 01/24/2023 09/22/22   Donita Brooks, MD  Lifitegrast Benay Spice) 5 % SOLN Place 1 drop into both eyes 2 (two) times daily. Patient not taking: Reported on 01/24/2023 12/01/21     sulfamethoxazole-trimethoprim (BACTRIM DS) 800-160 MG tablet Take 1 tablet by mouth 2 (two) times daily. Take with food Patient not taking: Reported on 01/24/2023 10/11/22       Allergies  Allergen Reactions   Neomycin-Polymyxin-Hc Itching   Piroxicam Rash and Other (See Comments)    Blistering rash      Statins Other (See Comments)    Myalgias   Diazepam Other (See Comments)    Dizziness  REACTION:  unknown, REACTION:  unknown   Doxycycline Nausea Only, Itching and Other (See Comments)    REACTION:  unknown, REACTION:  unknown   Gatifloxacin Anxiety and Swelling   Latex Rash    Band Aid    Social History   Tobacco Use   Smoking status: Former    Types: Cigarettes    Passive exposure: Yes   Smokeless tobacco: Former    Types: Chew   Tobacco comments:    "quit chewing in the 1960s"    Verified by Daughter, [name]  Vaping Use   Vaping status: Never Used  Substance Use  Topics   Alcohol use: No   Drug use: No    Family History  Problem Relation Age of Onset   Arthritis-Osteo Sister    Heart attack Brother    Heart attack Other    Cancer Neg Hx     Review of Systems  Constitutional: negative for fevers and chills All other systems reviewed and are negative    Constitutional: in no apparent distress  Vitals:   01/24/23 0849  BP: (!) 86/50  Pulse: 72  Resp: 16  Temp: (!) 97.1 F (36.2 C)  SpO2: 96%   EYES: anicteric Respiratory: normal respiratory effort Musculoskeletal: no edema  Labs: Lab Results  Component Value Date   WBC 7.3 11/05/2022   HGB 10.2 (L) 11/05/2022   HCT 29.4 (L) 11/05/2022   MCV 97.0 11/05/2022   PLT 42 (L) 11/05/2022    Lab Results  Component Value Date   CREATININE 1.28 (H) 11/05/2022   BUN 28 (H) 11/05/2022   NA 133 (L) 11/05/2022   K 3.9 11/05/2022   CL 100 11/05/2022   CO2 24 11/05/2022    Lab Results  Component Value Date   ALT 13 09/28/2022   AST 20 09/28/2022   ALKPHOS 58 09/28/2022   BILITOT 0.8 09/28/2022   INR 1.5 (H) 09/27/2022     Assessment: asymptomatic bacteruria.  I find it hard to equate his symptoms as described as urinary infections based on color, odor or feeling of frequency.  I suspect some of his symptoms are related to hydration and would recommend to assure good hydration, which may be  partially done by less Lasix, though will defer to the prescribing physician on the need for Lasix, but did suggest to consider backing down slowly and see how he does.  I discussed with her that her UA and culture will likely always be positive, whether or not he has symptoms of infection and do not necessarily indicated a need for antibiotics.  I recommended evaluation for fever, chills or new significant symptoms and otherwise consider hydration and other methods for 24-48 hours and see how he does, unless fever or no symptoms as above.    Plan: 1)  continue symptomatic care 2) consider slow decrease in Lasix and monitor Follow up here with new concerns as needed

## 2023-01-24 NOTE — Telephone Encounter (Signed)
Requested medications are due for refill today.  yes  Requested medications are on the active medications list.  yes  Last refill. 01/10/2022 #90 10 rf  Future visit scheduled.   no  Notes to clinic.  Labs are expired.     Requested Prescriptions  Pending Prescriptions Disp Refills   tamsulosin (FLOMAX) 0.4 MG CAPS capsule [Pharmacy Med Name: Tamsulosin HCl Oral Capsule 0.4 MG] 90 capsule 3    Sig: TAKE 1 CAPSULE EVERY DAY     Urology: Alpha-Adrenergic Blocker Failed - 01/23/2023  1:50 AM      Failed - PSA in normal range and within 360 days    PSA  Date Value Ref Range Status  06/24/2014 1.77 <=4.00 ng/mL Final    Comment:    Test Methodology: ECLIA PSA (Electrochemiluminescence Immunoassay)   For PSA values from 2.5-4.0, particularly in younger men <23 years old, the AUA and NCCN suggest testing for % Free PSA (3515) and evaluation of the rate of increase in PSA (PSA velocity).          Failed - Valid encounter within last 12 months    Recent Outpatient Visits           1 year ago Acute pain of right shoulder   [facility] Pickard, Priscille Heidelberg, MD   1 year ago Dysuria   [facility] Donita Brooks, MD   1 year ago Acute systolic congestive heart failure [facility])   [facility] Tanya Nones, Priscille Heidelberg, MD   1 year ago Acute cough   [facility] Valentino Nose, NP   1 year ago Need for immunization against influenza   [facility] Pickard, Priscille Heidelberg, MD              Passed - Last BP in normal range    BP Readings from Last 1 Encounters:  01/24/23 (!) 86/50

## 2023-01-26 ENCOUNTER — Other Ambulatory Visit: Payer: Self-pay

## 2023-01-26 ENCOUNTER — Telehealth: Payer: Self-pay

## 2023-01-26 DIAGNOSIS — I2782 Chronic pulmonary embolism: Secondary | ICD-10-CM | POA: Diagnosis not present

## 2023-01-26 DIAGNOSIS — I2510 Atherosclerotic heart disease of native coronary artery without angina pectoris: Secondary | ICD-10-CM | POA: Diagnosis not present

## 2023-01-26 DIAGNOSIS — J4489 Other specified chronic obstructive pulmonary disease: Secondary | ICD-10-CM | POA: Diagnosis not present

## 2023-01-26 DIAGNOSIS — R34 Anuria and oliguria: Secondary | ICD-10-CM

## 2023-01-26 DIAGNOSIS — M1990 Unspecified osteoarthritis, unspecified site: Secondary | ICD-10-CM | POA: Diagnosis not present

## 2023-01-26 DIAGNOSIS — I69351 Hemiplegia and hemiparesis following cerebral infarction affecting right dominant side: Secondary | ICD-10-CM | POA: Diagnosis not present

## 2023-01-26 DIAGNOSIS — I110 Hypertensive heart disease with heart failure: Secondary | ICD-10-CM | POA: Diagnosis not present

## 2023-01-26 DIAGNOSIS — E785 Hyperlipidemia, unspecified: Secondary | ICD-10-CM | POA: Diagnosis not present

## 2023-01-26 DIAGNOSIS — F419 Anxiety disorder, unspecified: Secondary | ICD-10-CM | POA: Diagnosis not present

## 2023-01-26 DIAGNOSIS — I5032 Chronic diastolic (congestive) heart failure: Secondary | ICD-10-CM | POA: Diagnosis not present

## 2023-01-26 MED ORDER — TAMSULOSIN HCL 0.4 MG PO CAPS
0.4000 mg | ORAL_CAPSULE | Freq: Every day | ORAL | 3 refills | Status: DC
Start: 2023-01-26 — End: 2023-11-16

## 2023-01-26 NOTE — Telephone Encounter (Signed)
Pt's daughter called in to check on the status of this med tamsulosin (FLOMAX) 0.4 MG CAPS capsule [[id number]]. Pt's daughter stated that pt is out of refills for this med and asks can a new prescription be faxed to [facility] pharmacy please.   [facility] fax [phone]

## 2023-01-27 DIAGNOSIS — N139 Obstructive and reflux uropathy, unspecified: Secondary | ICD-10-CM | POA: Diagnosis not present

## 2023-01-27 DIAGNOSIS — I5032 Chronic diastolic (congestive) heart failure: Secondary | ICD-10-CM | POA: Diagnosis not present

## 2023-01-27 DIAGNOSIS — I69351 Hemiplegia and hemiparesis following cerebral infarction affecting right dominant side: Secondary | ICD-10-CM | POA: Diagnosis not present

## 2023-01-27 DIAGNOSIS — J4489 Other specified chronic obstructive pulmonary disease: Secondary | ICD-10-CM | POA: Diagnosis not present

## 2023-01-27 DIAGNOSIS — I2782 Chronic pulmonary embolism: Secondary | ICD-10-CM | POA: Diagnosis not present

## 2023-01-27 DIAGNOSIS — E785 Hyperlipidemia, unspecified: Secondary | ICD-10-CM | POA: Diagnosis not present

## 2023-01-27 DIAGNOSIS — M1990 Unspecified osteoarthritis, unspecified site: Secondary | ICD-10-CM | POA: Diagnosis not present

## 2023-01-27 DIAGNOSIS — F419 Anxiety disorder, unspecified: Secondary | ICD-10-CM | POA: Diagnosis not present

## 2023-01-27 DIAGNOSIS — I110 Hypertensive heart disease with heart failure: Secondary | ICD-10-CM | POA: Diagnosis not present

## 2023-01-27 DIAGNOSIS — I2510 Atherosclerotic heart disease of native coronary artery without angina pectoris: Secondary | ICD-10-CM | POA: Diagnosis not present

## 2023-01-31 DIAGNOSIS — M1990 Unspecified osteoarthritis, unspecified site: Secondary | ICD-10-CM | POA: Diagnosis not present

## 2023-01-31 DIAGNOSIS — I69351 Hemiplegia and hemiparesis following cerebral infarction affecting right dominant side: Secondary | ICD-10-CM | POA: Diagnosis not present

## 2023-01-31 DIAGNOSIS — I5032 Chronic diastolic (congestive) heart failure: Secondary | ICD-10-CM | POA: Diagnosis not present

## 2023-01-31 DIAGNOSIS — I2510 Atherosclerotic heart disease of native coronary artery without angina pectoris: Secondary | ICD-10-CM | POA: Diagnosis not present

## 2023-01-31 DIAGNOSIS — E785 Hyperlipidemia, unspecified: Secondary | ICD-10-CM | POA: Diagnosis not present

## 2023-01-31 DIAGNOSIS — I110 Hypertensive heart disease with heart failure: Secondary | ICD-10-CM | POA: Diagnosis not present

## 2023-01-31 DIAGNOSIS — F419 Anxiety disorder, unspecified: Secondary | ICD-10-CM | POA: Diagnosis not present

## 2023-01-31 DIAGNOSIS — I2782 Chronic pulmonary embolism: Secondary | ICD-10-CM | POA: Diagnosis not present

## 2023-01-31 DIAGNOSIS — J4489 Other specified chronic obstructive pulmonary disease: Secondary | ICD-10-CM | POA: Diagnosis not present

## 2023-02-02 DIAGNOSIS — M1990 Unspecified osteoarthritis, unspecified site: Secondary | ICD-10-CM | POA: Diagnosis not present

## 2023-02-02 DIAGNOSIS — E785 Hyperlipidemia, unspecified: Secondary | ICD-10-CM | POA: Diagnosis not present

## 2023-02-02 DIAGNOSIS — I5032 Chronic diastolic (congestive) heart failure: Secondary | ICD-10-CM | POA: Diagnosis not present

## 2023-02-02 DIAGNOSIS — J4489 Other specified chronic obstructive pulmonary disease: Secondary | ICD-10-CM | POA: Diagnosis not present

## 2023-02-02 DIAGNOSIS — F419 Anxiety disorder, unspecified: Secondary | ICD-10-CM | POA: Diagnosis not present

## 2023-02-02 DIAGNOSIS — I2510 Atherosclerotic heart disease of native coronary artery without angina pectoris: Secondary | ICD-10-CM | POA: Diagnosis not present

## 2023-02-02 DIAGNOSIS — I2782 Chronic pulmonary embolism: Secondary | ICD-10-CM | POA: Diagnosis not present

## 2023-02-02 DIAGNOSIS — I69351 Hemiplegia and hemiparesis following cerebral infarction affecting right dominant side: Secondary | ICD-10-CM | POA: Diagnosis not present

## 2023-02-02 DIAGNOSIS — I110 Hypertensive heart disease with heart failure: Secondary | ICD-10-CM | POA: Diagnosis not present

## 2023-02-05 ENCOUNTER — Other Ambulatory Visit: Payer: Self-pay | Admitting: Family Medicine

## 2023-02-05 DIAGNOSIS — J4489 Other specified chronic obstructive pulmonary disease: Secondary | ICD-10-CM | POA: Diagnosis not present

## 2023-02-05 DIAGNOSIS — I69351 Hemiplegia and hemiparesis following cerebral infarction affecting right dominant side: Secondary | ICD-10-CM | POA: Diagnosis not present

## 2023-02-05 DIAGNOSIS — F419 Anxiety disorder, unspecified: Secondary | ICD-10-CM | POA: Diagnosis not present

## 2023-02-05 DIAGNOSIS — I2782 Chronic pulmonary embolism: Secondary | ICD-10-CM | POA: Diagnosis not present

## 2023-02-05 DIAGNOSIS — I110 Hypertensive heart disease with heart failure: Secondary | ICD-10-CM | POA: Diagnosis not present

## 2023-02-05 DIAGNOSIS — E785 Hyperlipidemia, unspecified: Secondary | ICD-10-CM | POA: Diagnosis not present

## 2023-02-05 DIAGNOSIS — M1990 Unspecified osteoarthritis, unspecified site: Secondary | ICD-10-CM | POA: Diagnosis not present

## 2023-02-05 DIAGNOSIS — I5032 Chronic diastolic (congestive) heart failure: Secondary | ICD-10-CM | POA: Diagnosis not present

## 2023-02-05 DIAGNOSIS — I2510 Atherosclerotic heart disease of native coronary artery without angina pectoris: Secondary | ICD-10-CM | POA: Diagnosis not present

## 2023-02-06 DIAGNOSIS — I5032 Chronic diastolic (congestive) heart failure: Secondary | ICD-10-CM | POA: Diagnosis not present

## 2023-02-06 DIAGNOSIS — J4489 Other specified chronic obstructive pulmonary disease: Secondary | ICD-10-CM | POA: Diagnosis not present

## 2023-02-06 DIAGNOSIS — M1990 Unspecified osteoarthritis, unspecified site: Secondary | ICD-10-CM | POA: Diagnosis not present

## 2023-02-06 DIAGNOSIS — E785 Hyperlipidemia, unspecified: Secondary | ICD-10-CM | POA: Diagnosis not present

## 2023-02-06 DIAGNOSIS — I2510 Atherosclerotic heart disease of native coronary artery without angina pectoris: Secondary | ICD-10-CM | POA: Diagnosis not present

## 2023-02-06 DIAGNOSIS — I110 Hypertensive heart disease with heart failure: Secondary | ICD-10-CM | POA: Diagnosis not present

## 2023-02-06 DIAGNOSIS — I2782 Chronic pulmonary embolism: Secondary | ICD-10-CM | POA: Diagnosis not present

## 2023-02-06 DIAGNOSIS — I69351 Hemiplegia and hemiparesis following cerebral infarction affecting right dominant side: Secondary | ICD-10-CM | POA: Diagnosis not present

## 2023-02-06 DIAGNOSIS — F419 Anxiety disorder, unspecified: Secondary | ICD-10-CM | POA: Diagnosis not present

## 2023-02-06 NOTE — Telephone Encounter (Signed)
Requested Prescriptions  Pending Prescriptions Disp Refills   pantoprazole (PROTONIX) 40 MG tablet [Pharmacy Med Name: Pantoprazole Sodium Oral Tablet Delayed Release 40 MG] 90 tablet 0    Sig: TAKE 1 TABLET EVERY MORNING     Gastroenterology: Proton Pump Inhibitors Failed - 02/05/2023  6:56 AM      Failed - Valid encounter within last 12 months    Recent Outpatient Visits           1 year ago Acute pain of right shoulder   [facility] Pickard, Priscille Heidelberg, MD   1 year ago Dysuria   [facility] Donita Brooks, MD   1 year ago Acute systolic congestive heart failure [facility])   [facility] Donita Brooks, MD   1 year ago Acute cough   [facility] Valentino Nose, NP   1 year ago Need for immunization against influenza   [facility] Pickard, Priscille Heidelberg, MD

## 2023-02-08 DIAGNOSIS — I2510 Atherosclerotic heart disease of native coronary artery without angina pectoris: Secondary | ICD-10-CM | POA: Diagnosis not present

## 2023-02-08 DIAGNOSIS — I5032 Chronic diastolic (congestive) heart failure: Secondary | ICD-10-CM | POA: Diagnosis not present

## 2023-02-08 DIAGNOSIS — I2782 Chronic pulmonary embolism: Secondary | ICD-10-CM | POA: Diagnosis not present

## 2023-02-08 DIAGNOSIS — M1990 Unspecified osteoarthritis, unspecified site: Secondary | ICD-10-CM | POA: Diagnosis not present

## 2023-02-08 DIAGNOSIS — F419 Anxiety disorder, unspecified: Secondary | ICD-10-CM | POA: Diagnosis not present

## 2023-02-08 DIAGNOSIS — E785 Hyperlipidemia, unspecified: Secondary | ICD-10-CM | POA: Diagnosis not present

## 2023-02-08 DIAGNOSIS — J4489 Other specified chronic obstructive pulmonary disease: Secondary | ICD-10-CM | POA: Diagnosis not present

## 2023-02-08 DIAGNOSIS — I69351 Hemiplegia and hemiparesis following cerebral infarction affecting right dominant side: Secondary | ICD-10-CM | POA: Diagnosis not present

## 2023-02-08 DIAGNOSIS — I110 Hypertensive heart disease with heart failure: Secondary | ICD-10-CM | POA: Diagnosis not present

## 2023-02-09 ENCOUNTER — Other Ambulatory Visit (HOSPITAL_COMMUNITY): Payer: Self-pay

## 2023-02-09 DIAGNOSIS — I5032 Chronic diastolic (congestive) heart failure: Secondary | ICD-10-CM | POA: Diagnosis not present

## 2023-02-09 DIAGNOSIS — M1990 Unspecified osteoarthritis, unspecified site: Secondary | ICD-10-CM | POA: Diagnosis not present

## 2023-02-09 DIAGNOSIS — E785 Hyperlipidemia, unspecified: Secondary | ICD-10-CM | POA: Diagnosis not present

## 2023-02-09 DIAGNOSIS — I110 Hypertensive heart disease with heart failure: Secondary | ICD-10-CM | POA: Diagnosis not present

## 2023-02-09 DIAGNOSIS — J4489 Other specified chronic obstructive pulmonary disease: Secondary | ICD-10-CM | POA: Diagnosis not present

## 2023-02-09 DIAGNOSIS — F419 Anxiety disorder, unspecified: Secondary | ICD-10-CM | POA: Diagnosis not present

## 2023-02-09 DIAGNOSIS — I69351 Hemiplegia and hemiparesis following cerebral infarction affecting right dominant side: Secondary | ICD-10-CM | POA: Diagnosis not present

## 2023-02-09 DIAGNOSIS — I2510 Atherosclerotic heart disease of native coronary artery without angina pectoris: Secondary | ICD-10-CM | POA: Diagnosis not present

## 2023-02-09 DIAGNOSIS — I2782 Chronic pulmonary embolism: Secondary | ICD-10-CM | POA: Diagnosis not present

## 2023-02-10 NOTE — Progress Notes (Signed)
Faxed to [facility] per MD request.  Release: [phone]

## 2023-02-13 ENCOUNTER — Other Ambulatory Visit (HOSPITAL_COMMUNITY): Payer: Self-pay

## 2023-02-15 DIAGNOSIS — J4489 Other specified chronic obstructive pulmonary disease: Secondary | ICD-10-CM | POA: Diagnosis not present

## 2023-02-15 DIAGNOSIS — I110 Hypertensive heart disease with heart failure: Secondary | ICD-10-CM | POA: Diagnosis not present

## 2023-02-15 DIAGNOSIS — M1990 Unspecified osteoarthritis, unspecified site: Secondary | ICD-10-CM | POA: Diagnosis not present

## 2023-02-15 DIAGNOSIS — I69351 Hemiplegia and hemiparesis following cerebral infarction affecting right dominant side: Secondary | ICD-10-CM | POA: Diagnosis not present

## 2023-02-15 DIAGNOSIS — E785 Hyperlipidemia, unspecified: Secondary | ICD-10-CM | POA: Diagnosis not present

## 2023-02-15 DIAGNOSIS — I2510 Atherosclerotic heart disease of native coronary artery without angina pectoris: Secondary | ICD-10-CM | POA: Diagnosis not present

## 2023-02-15 DIAGNOSIS — F419 Anxiety disorder, unspecified: Secondary | ICD-10-CM | POA: Diagnosis not present

## 2023-02-15 DIAGNOSIS — I5032 Chronic diastolic (congestive) heart failure: Secondary | ICD-10-CM | POA: Diagnosis not present

## 2023-02-15 DIAGNOSIS — I2782 Chronic pulmonary embolism: Secondary | ICD-10-CM | POA: Diagnosis not present

## 2023-02-16 DIAGNOSIS — I69351 Hemiplegia and hemiparesis following cerebral infarction affecting right dominant side: Secondary | ICD-10-CM | POA: Diagnosis not present

## 2023-02-16 DIAGNOSIS — I2782 Chronic pulmonary embolism: Secondary | ICD-10-CM | POA: Diagnosis not present

## 2023-02-16 DIAGNOSIS — J4489 Other specified chronic obstructive pulmonary disease: Secondary | ICD-10-CM | POA: Diagnosis not present

## 2023-02-16 DIAGNOSIS — F419 Anxiety disorder, unspecified: Secondary | ICD-10-CM | POA: Diagnosis not present

## 2023-02-16 DIAGNOSIS — I110 Hypertensive heart disease with heart failure: Secondary | ICD-10-CM | POA: Diagnosis not present

## 2023-02-16 DIAGNOSIS — M1990 Unspecified osteoarthritis, unspecified site: Secondary | ICD-10-CM | POA: Diagnosis not present

## 2023-02-16 DIAGNOSIS — I5032 Chronic diastolic (congestive) heart failure: Secondary | ICD-10-CM | POA: Diagnosis not present

## 2023-02-16 DIAGNOSIS — E785 Hyperlipidemia, unspecified: Secondary | ICD-10-CM | POA: Diagnosis not present

## 2023-02-16 DIAGNOSIS — I2510 Atherosclerotic heart disease of native coronary artery without angina pectoris: Secondary | ICD-10-CM | POA: Diagnosis not present

## 2023-02-21 ENCOUNTER — Ambulatory Visit: Payer: Medicare HMO | Admitting: Podiatry

## 2023-02-21 ENCOUNTER — Encounter: Payer: Self-pay | Admitting: Podiatry

## 2023-02-21 DIAGNOSIS — M79674 Pain in right toe(s): Secondary | ICD-10-CM | POA: Diagnosis not present

## 2023-02-21 DIAGNOSIS — B351 Tinea unguium: Secondary | ICD-10-CM

## 2023-02-21 DIAGNOSIS — M79675 Pain in left toe(s): Secondary | ICD-10-CM | POA: Diagnosis not present

## 2023-02-21 DIAGNOSIS — Z7901 Long term (current) use of anticoagulants: Secondary | ICD-10-CM | POA: Diagnosis not present

## 2023-02-21 NOTE — Progress Notes (Signed)
  Subjective:  Patient ID: Evan [name], male    DOB: 04/15/1927,   MRN: [id number]  No chief complaint on file.   89 y.o. male presents for concern of thickened elongated and painful nails that are difficult to trim. Requesting to have them trimmed today. Relates burning and tingling in their feet. Patient with PVD and on blood thinner.   PCP:  Donita Brooks, MD    . Denies any other pedal complaints. Denies n/v/f/c.   Past Medical History:  Diagnosis Date   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   Anginal chest pain at rest [facility])    Chronic non, controlled on Lorazepam   Anxiety    Arthritis    "all over"    BPH (benign prostatic hyperplasia)    CAD (coronary artery disease)    Chronic lower back pain    Complication of anesthesia    "problems making water afterwards"   Depression    DJD (degenerative joint disease)    Esophageal ulcer without bleeding    bid ppi indefinitely   Family history of adverse reaction to anesthesia    "children w/PONV"   GERD (gastroesophageal reflux disease)    Headache    "couple/week maybe" (09/04/2015)   Hemochromatosis    Possible, elevated iron stores, hook-like osteophytes on hand films, normal LFTs   Hepatitis    "yellow jaundice as a baby"   History of ASCVD    MULTIVESSEL   Hyperlipidemia    Hypertension    Mild aortic stenosis 06/23/2021   Osteoarthritis    Pneumonia    "several times; got a little now" (09/04/2015)   Rheumatoid arthritis (HCC)    Subdural hematoma (HCC)    small after fall 09/2016-plavix held, neurosurgery consulted.  Asymptomatic.     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 he failed PO Eliquis    Objective:  Physical Exam: Vascular: DP/PT pulses 1/4 bilateral. CFT <3 seconds. Absent hair growth on digits. Edema noted to bilateral lower extremities. Xerosis noted bilaterally.  Skin. No lacerations or abrasions bilateral feet. Nails 1-5 bilateral  are thickened discolored and  elongated with subungual debris.  Musculoskeletal: MMT 5/5 bilateral lower extremities in DF, PF, Inversion and Eversion. Deceased ROM in DF of ankle joint.  Neurological: Sensation intact to light touch. Protective sensation diminished bilateral.    Assessment:   1. Pain due to onychomycosis of toenails of both feet   2. Chronic anticoagulation      Plan:  Patient was evaluated and treated and all questions answered. -Mechanically debrided all nails 1-5 bilateral using sterile nail nipper and filed with dremel without incident  -Answered all patient questions -Patient to return  in 3 months for at risk foot care -Patient advised to call the office if any problems or questions arise in the meantime.   Louann Sjogren, DPM

## 2023-02-24 DIAGNOSIS — F419 Anxiety disorder, unspecified: Secondary | ICD-10-CM | POA: Diagnosis not present

## 2023-02-24 DIAGNOSIS — I2782 Chronic pulmonary embolism: Secondary | ICD-10-CM | POA: Diagnosis not present

## 2023-02-24 DIAGNOSIS — J4489 Other specified chronic obstructive pulmonary disease: Secondary | ICD-10-CM | POA: Diagnosis not present

## 2023-02-24 DIAGNOSIS — E785 Hyperlipidemia, unspecified: Secondary | ICD-10-CM | POA: Diagnosis not present

## 2023-02-24 DIAGNOSIS — I110 Hypertensive heart disease with heart failure: Secondary | ICD-10-CM | POA: Diagnosis not present

## 2023-02-24 DIAGNOSIS — I2510 Atherosclerotic heart disease of native coronary artery without angina pectoris: Secondary | ICD-10-CM | POA: Diagnosis not present

## 2023-02-24 DIAGNOSIS — M1990 Unspecified osteoarthritis, unspecified site: Secondary | ICD-10-CM | POA: Diagnosis not present

## 2023-02-24 DIAGNOSIS — I69351 Hemiplegia and hemiparesis following cerebral infarction affecting right dominant side: Secondary | ICD-10-CM | POA: Diagnosis not present

## 2023-02-24 DIAGNOSIS — I5032 Chronic diastolic (congestive) heart failure: Secondary | ICD-10-CM | POA: Diagnosis not present

## 2023-02-27 DIAGNOSIS — R3914 Feeling of incomplete bladder emptying: Secondary | ICD-10-CM | POA: Diagnosis not present

## 2023-02-28 ENCOUNTER — Telehealth: Payer: Self-pay

## 2023-02-28 NOTE — Telephone Encounter (Signed)
Spoke with patient's daughter [name] and scheduled AWV. Nothing further needed at this time.

## 2023-02-28 NOTE — Telephone Encounter (Signed)
Copied from CRM [id number]. Topic: General - Other >> Feb 28, 2023  2:10 PM Chantha C wrote: Reason for CRM: Pt's dtr Beckie Busing is calling Victorino Dike back regarding a message that was left on pt needing a checkup before the end of the year is required. [name] is unsure and would like to speak w/Jennifer,please call back at [phone].

## 2023-03-01 DIAGNOSIS — I5032 Chronic diastolic (congestive) heart failure: Secondary | ICD-10-CM | POA: Diagnosis not present

## 2023-03-01 DIAGNOSIS — E785 Hyperlipidemia, unspecified: Secondary | ICD-10-CM | POA: Diagnosis not present

## 2023-03-01 DIAGNOSIS — I110 Hypertensive heart disease with heart failure: Secondary | ICD-10-CM | POA: Diagnosis not present

## 2023-03-01 DIAGNOSIS — M1990 Unspecified osteoarthritis, unspecified site: Secondary | ICD-10-CM | POA: Diagnosis not present

## 2023-03-01 DIAGNOSIS — I69351 Hemiplegia and hemiparesis following cerebral infarction affecting right dominant side: Secondary | ICD-10-CM | POA: Diagnosis not present

## 2023-03-01 DIAGNOSIS — I2510 Atherosclerotic heart disease of native coronary artery without angina pectoris: Secondary | ICD-10-CM | POA: Diagnosis not present

## 2023-03-01 DIAGNOSIS — J4489 Other specified chronic obstructive pulmonary disease: Secondary | ICD-10-CM | POA: Diagnosis not present

## 2023-03-01 DIAGNOSIS — I2782 Chronic pulmonary embolism: Secondary | ICD-10-CM | POA: Diagnosis not present

## 2023-03-01 DIAGNOSIS — F419 Anxiety disorder, unspecified: Secondary | ICD-10-CM | POA: Diagnosis not present

## 2023-03-06 DIAGNOSIS — J4489 Other specified chronic obstructive pulmonary disease: Secondary | ICD-10-CM | POA: Diagnosis not present

## 2023-03-06 DIAGNOSIS — I5032 Chronic diastolic (congestive) heart failure: Secondary | ICD-10-CM | POA: Diagnosis not present

## 2023-03-06 DIAGNOSIS — M1990 Unspecified osteoarthritis, unspecified site: Secondary | ICD-10-CM | POA: Diagnosis not present

## 2023-03-06 DIAGNOSIS — I2510 Atherosclerotic heart disease of native coronary artery without angina pectoris: Secondary | ICD-10-CM | POA: Diagnosis not present

## 2023-03-06 DIAGNOSIS — I110 Hypertensive heart disease with heart failure: Secondary | ICD-10-CM | POA: Diagnosis not present

## 2023-03-06 DIAGNOSIS — I69351 Hemiplegia and hemiparesis following cerebral infarction affecting right dominant side: Secondary | ICD-10-CM | POA: Diagnosis not present

## 2023-03-06 DIAGNOSIS — F419 Anxiety disorder, unspecified: Secondary | ICD-10-CM | POA: Diagnosis not present

## 2023-03-06 DIAGNOSIS — I2782 Chronic pulmonary embolism: Secondary | ICD-10-CM | POA: Diagnosis not present

## 2023-03-06 DIAGNOSIS — E785 Hyperlipidemia, unspecified: Secondary | ICD-10-CM | POA: Diagnosis not present

## 2023-03-08 ENCOUNTER — Emergency Department (HOSPITAL_COMMUNITY): Payer: Medicare HMO

## 2023-03-08 ENCOUNTER — Inpatient Hospital Stay (HOSPITAL_COMMUNITY)
Admission: EM | Admit: 2023-03-08 | Discharge: 2023-03-13 | DRG: 871 | Disposition: A | Discharge status: Home / Self Care | Payer: Medicare HMO | Attending: Internal Medicine | Admitting: Internal Medicine

## 2023-03-08 ENCOUNTER — Other Ambulatory Visit: Payer: Self-pay

## 2023-03-08 ENCOUNTER — Encounter (HOSPITAL_COMMUNITY): Payer: Self-pay | Admitting: Internal Medicine

## 2023-03-08 DIAGNOSIS — J984 Other disorders of lung: Secondary | ICD-10-CM | POA: Diagnosis not present

## 2023-03-08 DIAGNOSIS — Z923 Personal history of irradiation: Secondary | ICD-10-CM

## 2023-03-08 DIAGNOSIS — H9190 Unspecified hearing loss, unspecified ear: Secondary | ICD-10-CM | POA: Diagnosis present

## 2023-03-08 DIAGNOSIS — J9610 Chronic respiratory failure, unspecified whether with hypoxia or hypercapnia: Secondary | ICD-10-CM | POA: Diagnosis not present

## 2023-03-08 DIAGNOSIS — K219 Gastro-esophageal reflux disease without esophagitis: Secondary | ICD-10-CM | POA: Diagnosis present

## 2023-03-08 DIAGNOSIS — F0394 Unspecified dementia, unspecified severity, with anxiety: Secondary | ICD-10-CM | POA: Diagnosis present

## 2023-03-08 DIAGNOSIS — Z8249 Family history of ischemic heart disease and other diseases of the circulatory system: Secondary | ICD-10-CM

## 2023-03-08 DIAGNOSIS — I700 Atherosclerosis of aorta: Secondary | ICD-10-CM | POA: Diagnosis not present

## 2023-03-08 DIAGNOSIS — R059 Cough, unspecified: Secondary | ICD-10-CM | POA: Diagnosis not present

## 2023-03-08 DIAGNOSIS — L89322 Pressure ulcer of left buttock, stage 2: Secondary | ICD-10-CM | POA: Diagnosis not present

## 2023-03-08 DIAGNOSIS — I610 Nontraumatic intracerebral hemorrhage in hemisphere, subcortical: Secondary | ICD-10-CM | POA: Diagnosis present

## 2023-03-08 DIAGNOSIS — Z881 Allergy status to other antibiotic agents status: Secondary | ICD-10-CM

## 2023-03-08 DIAGNOSIS — L89101 Pressure ulcer of unspecified part of back, stage 1: Secondary | ICD-10-CM | POA: Diagnosis present

## 2023-03-08 DIAGNOSIS — E785 Hyperlipidemia, unspecified: Secondary | ICD-10-CM | POA: Diagnosis present

## 2023-03-08 DIAGNOSIS — Z7901 Long term (current) use of anticoagulants: Secondary | ICD-10-CM

## 2023-03-08 DIAGNOSIS — F32A Depression, unspecified: Secondary | ICD-10-CM | POA: Diagnosis present

## 2023-03-08 DIAGNOSIS — I5020 Unspecified systolic (congestive) heart failure: Secondary | ICD-10-CM | POA: Diagnosis not present

## 2023-03-08 DIAGNOSIS — Z96653 Presence of artificial knee joint, bilateral: Secondary | ICD-10-CM | POA: Diagnosis present

## 2023-03-08 DIAGNOSIS — Z888 Allergy status to other drugs, medicaments and biological substances status: Secondary | ICD-10-CM

## 2023-03-08 DIAGNOSIS — G4700 Insomnia, unspecified: Secondary | ICD-10-CM | POA: Diagnosis present

## 2023-03-08 DIAGNOSIS — Z978 Presence of other specified devices: Secondary | ICD-10-CM

## 2023-03-08 DIAGNOSIS — N400 Enlarged prostate without lower urinary tract symptoms: Secondary | ICD-10-CM | POA: Diagnosis not present

## 2023-03-08 DIAGNOSIS — E8721 Acute metabolic acidosis: Secondary | ICD-10-CM | POA: Diagnosis not present

## 2023-03-08 DIAGNOSIS — Z951 Presence of aortocoronary bypass graft: Secondary | ICD-10-CM

## 2023-03-08 DIAGNOSIS — Z1152 Encounter for screening for COVID-19: Secondary | ICD-10-CM

## 2023-03-08 DIAGNOSIS — F411 Generalized anxiety disorder: Secondary | ICD-10-CM | POA: Diagnosis present

## 2023-03-08 DIAGNOSIS — I13 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 failure and stage 1 through stage 4 chronic kidney disease, or unspecified chronic kidney disease: Secondary | ICD-10-CM | POA: Diagnosis present

## 2023-03-08 DIAGNOSIS — Z947 Corneal transplant status: Secondary | ICD-10-CM

## 2023-03-08 DIAGNOSIS — J189 Pneumonia, unspecified organism: Secondary | ICD-10-CM | POA: Diagnosis not present

## 2023-03-08 DIAGNOSIS — Z86711 Personal history of pulmonary embolism: Secondary | ICD-10-CM | POA: Diagnosis present

## 2023-03-08 DIAGNOSIS — I517 Cardiomegaly: Secondary | ICD-10-CM | POA: Diagnosis not present

## 2023-03-08 DIAGNOSIS — E876 Hypokalemia: Secondary | ICD-10-CM | POA: Diagnosis present

## 2023-03-08 DIAGNOSIS — A419 Sepsis, unspecified organism: Secondary | ICD-10-CM | POA: Diagnosis not present

## 2023-03-08 DIAGNOSIS — A413 Sepsis due to Hemophilus influenzae: Secondary | ICD-10-CM | POA: Diagnosis not present

## 2023-03-08 DIAGNOSIS — I5022 Chronic systolic (congestive) heart failure: Secondary | ICD-10-CM | POA: Diagnosis present

## 2023-03-08 DIAGNOSIS — R0602 Shortness of breath: Secondary | ICD-10-CM | POA: Diagnosis not present

## 2023-03-08 DIAGNOSIS — J438 Other emphysema: Secondary | ICD-10-CM

## 2023-03-08 DIAGNOSIS — J14 Pneumonia due to Hemophilus influenzae: Secondary | ICD-10-CM | POA: Diagnosis not present

## 2023-03-08 DIAGNOSIS — I2699 Other pulmonary embolism without acute cor pulmonale: Secondary | ICD-10-CM | POA: Diagnosis present

## 2023-03-08 DIAGNOSIS — N179 Acute kidney failure, unspecified: Secondary | ICD-10-CM | POA: Diagnosis present

## 2023-03-08 DIAGNOSIS — D649 Anemia, unspecified: Secondary | ICD-10-CM | POA: Diagnosis present

## 2023-03-08 DIAGNOSIS — N183 Chronic kidney disease, stage 3 unspecified: Secondary | ICD-10-CM | POA: Diagnosis present

## 2023-03-08 DIAGNOSIS — D631 Anemia in chronic kidney disease: Secondary | ICD-10-CM | POA: Diagnosis present

## 2023-03-08 DIAGNOSIS — F0390 Unspecified dementia without behavioral disturbance: Secondary | ICD-10-CM | POA: Diagnosis not present

## 2023-03-08 DIAGNOSIS — Z79899 Other long term (current) drug therapy: Secondary | ICD-10-CM

## 2023-03-08 DIAGNOSIS — F339 Major depressive disorder, recurrent, unspecified: Secondary | ICD-10-CM | POA: Diagnosis not present

## 2023-03-08 DIAGNOSIS — L89109 Pressure ulcer of unspecified part of back, unspecified stage: Secondary | ICD-10-CM | POA: Diagnosis present

## 2023-03-08 DIAGNOSIS — Z91040 Latex allergy status: Secondary | ICD-10-CM

## 2023-03-08 DIAGNOSIS — Z87891 Personal history of nicotine dependence: Secondary | ICD-10-CM

## 2023-03-08 DIAGNOSIS — L89312 Pressure ulcer of right buttock, stage 2: Secondary | ICD-10-CM | POA: Diagnosis present

## 2023-03-08 DIAGNOSIS — Z961 Presence of intraocular lens: Secondary | ICD-10-CM | POA: Diagnosis present

## 2023-03-08 DIAGNOSIS — D696 Thrombocytopenia, unspecified: Secondary | ICD-10-CM | POA: Diagnosis not present

## 2023-03-08 DIAGNOSIS — J700 Acute pulmonary manifestations due to radiation: Secondary | ICD-10-CM | POA: Diagnosis present

## 2023-03-08 DIAGNOSIS — I4821 Permanent atrial fibrillation: Secondary | ICD-10-CM | POA: Diagnosis present

## 2023-03-08 DIAGNOSIS — Z955 Presence of coronary angioplasty implant and graft: Secondary | ICD-10-CM

## 2023-03-08 DIAGNOSIS — E875 Hyperkalemia: Secondary | ICD-10-CM | POA: Diagnosis present

## 2023-03-08 DIAGNOSIS — R509 Fever, unspecified: Secondary | ICD-10-CM | POA: Diagnosis not present

## 2023-03-08 DIAGNOSIS — R591 Generalized enlarged lymph nodes: Secondary | ICD-10-CM | POA: Diagnosis not present

## 2023-03-08 DIAGNOSIS — I10 Essential (primary) hypertension: Secondary | ICD-10-CM | POA: Diagnosis present

## 2023-03-08 DIAGNOSIS — Z22322 Carrier or suspected carrier of Methicillin resistant Staphylococcus aureus: Secondary | ICD-10-CM

## 2023-03-08 DIAGNOSIS — J45909 Unspecified asthma, uncomplicated: Secondary | ICD-10-CM | POA: Diagnosis present

## 2023-03-08 DIAGNOSIS — Z9842 Cataract extraction status, left eye: Secondary | ICD-10-CM

## 2023-03-08 DIAGNOSIS — Z85118 Personal history of other malignant neoplasm of bronchus and lung: Secondary | ICD-10-CM

## 2023-03-08 DIAGNOSIS — J440 Chronic obstructive pulmonary disease with acute lower respiratory infection: Secondary | ICD-10-CM | POA: Diagnosis present

## 2023-03-08 DIAGNOSIS — I2510 Atherosclerotic heart disease of native coronary artery without angina pectoris: Secondary | ICD-10-CM | POA: Diagnosis not present

## 2023-03-08 DIAGNOSIS — N1832 Chronic kidney disease, stage 3b: Secondary | ICD-10-CM | POA: Diagnosis not present

## 2023-03-08 DIAGNOSIS — Z886 Allergy status to analgesic agent status: Secondary | ICD-10-CM

## 2023-03-08 DIAGNOSIS — Z86718 Personal history of other venous thrombosis and embolism: Secondary | ICD-10-CM

## 2023-03-08 DIAGNOSIS — R918 Other nonspecific abnormal finding of lung field: Secondary | ICD-10-CM | POA: Diagnosis not present

## 2023-03-08 DIAGNOSIS — Z9841 Cataract extraction status, right eye: Secondary | ICD-10-CM

## 2023-03-08 DIAGNOSIS — I2782 Chronic pulmonary embolism: Secondary | ICD-10-CM | POA: Diagnosis not present

## 2023-03-08 DIAGNOSIS — J90 Pleural effusion, not elsewhere classified: Secondary | ICD-10-CM | POA: Diagnosis not present

## 2023-03-08 DIAGNOSIS — Z883 Allergy status to other anti-infective agents status: Secondary | ICD-10-CM

## 2023-03-08 DIAGNOSIS — R1311 Dysphagia, oral phase: Secondary | ICD-10-CM | POA: Diagnosis present

## 2023-03-08 DIAGNOSIS — M069 Rheumatoid arthritis, unspecified: Secondary | ICD-10-CM | POA: Diagnosis present

## 2023-03-08 DIAGNOSIS — J449 Chronic obstructive pulmonary disease, unspecified: Secondary | ICD-10-CM | POA: Diagnosis present

## 2023-03-08 DIAGNOSIS — I69359 Hemiplegia and hemiparesis following cerebral infarction affecting unspecified side: Secondary | ICD-10-CM

## 2023-03-08 DIAGNOSIS — R251 Tremor, unspecified: Secondary | ICD-10-CM | POA: Diagnosis present

## 2023-03-08 DIAGNOSIS — Z7951 Long term (current) use of inhaled steroids: Secondary | ICD-10-CM

## 2023-03-08 HISTORY — DX: Acute posthemorrhagic anemia: D62

## 2023-03-08 HISTORY — DX: Cellulitis of left axilla: L03.112

## 2023-03-08 HISTORY — DX: Hypotension, unspecified: I95.9

## 2023-03-08 LAB — I-STAT CG4 LACTIC ACID, ED
Lactic Acid, Venous: 2.1 mmol/L (ref 0.5–1.9)
Lactic Acid, Venous: 1.5 mmol/L (ref 0.5–1.9)

## 2023-03-08 LAB — URINALYSIS, COMPLETE (UACMP) WITH MICROSCOPIC
Bilirubin Urine: NEGATIVE
Glucose, UA: NEGATIVE mg/dL
Ketones, ur: NEGATIVE mg/dL
Nitrite: NEGATIVE
Protein, ur: 100 mg/dL — AB
Specific Gravity, Urine: 1.015 (ref 1.005–1.030)
WBC, UA: >50 WBC/hpf (ref 0–5)
pH: 5 (ref 5.0–8.0)

## 2023-03-08 LAB — COMPREHENSIVE METABOLIC PANEL
ALT: 10 U/L (ref 0–44)
AST: 18 U/L (ref 15–41)
Albumin: 3.2 g/dL — ABNORMAL LOW (ref 3.5–5.0)
Alkaline Phosphatase: 72 U/L (ref 38–126)
Anion gap: 13 (ref 5–15)
BUN: 24 mg/dL — ABNORMAL HIGH (ref 8–23)
CO2: 18 mmol/L — ABNORMAL LOW (ref 22–32)
Calcium: 9.1 mg/dL (ref 8.9–10.3)
Chloride: 104 mmol/L (ref 98–111)
Creatinine, Ser: 1.53 mg/dL — ABNORMAL HIGH (ref 0.61–1.24)
GFR, Estimated: 42 mL/min — ABNORMAL LOW (ref >60)
Glucose, Bld: 119 mg/dL — ABNORMAL HIGH (ref 70–99)
Potassium: 3.4 mmol/L — ABNORMAL LOW (ref 3.5–5.1)
Sodium: 135 mmol/L (ref 135–145)
Total Bilirubin: 2.6 mg/dL — ABNORMAL HIGH (ref <1.2)
Total Protein: 6.9 g/dL (ref 6.5–8.1)

## 2023-03-08 LAB — CBC WITH DIFFERENTIAL/PLATELET
Abs Immature Granulocytes: 0.25 10*3/uL — ABNORMAL HIGH (ref 0.00–0.07)
Basophils Absolute: 0 10*3/uL (ref 0.0–0.1)
Basophils Relative: 0 %
Eosinophils Absolute: 0 10*3/uL (ref 0.0–0.5)
Eosinophils Relative: 0 %
HCT: 32.2 % — ABNORMAL LOW (ref 39.0–52.0)
Hemoglobin: 10.4 g/dL — ABNORMAL LOW (ref 13.0–17.0)
Immature Granulocytes: 1 %
Lymphocytes Relative: 5 %
Lymphs Abs: 1 10*3/uL (ref 0.7–4.0)
MCH: 33.3 pg (ref 26.0–34.0)
MCHC: 32.3 g/dL (ref 30.0–36.0)
MCV: 103.2 fL — ABNORMAL HIGH (ref 80.0–100.0)
Monocytes Absolute: 1.5 10*3/uL — ABNORMAL HIGH (ref 0.1–1.0)
Monocytes Relative: 7 %
Neutro Abs: 17.8 10*3/uL — ABNORMAL HIGH (ref 1.7–7.7)
Neutrophils Relative %: 87 %
Platelets: 80 10*3/uL — ABNORMAL LOW (ref 150–400)
RBC: 3.12 MIL/uL — ABNORMAL LOW (ref 4.22–5.81)
RDW: 14.6 % (ref 11.5–15.5)
WBC: 20.6 10*3/uL — ABNORMAL HIGH (ref 4.0–10.5)
nRBC: 0 % (ref 0.0–0.2)

## 2023-03-08 LAB — I-STAT CHEM 8, ED
BUN: 27 mg/dL — ABNORMAL HIGH (ref 8–23)
Calcium, Ion: 1.05 mmol/L — ABNORMAL LOW (ref 1.15–1.40)
Chloride: 104 mmol/L (ref 98–111)
Creatinine, Ser: 1.5 mg/dL — ABNORMAL HIGH (ref 0.61–1.24)
Glucose, Bld: 116 mg/dL — ABNORMAL HIGH (ref 70–99)
HCT: 31 % — ABNORMAL LOW (ref 39.0–52.0)
Hemoglobin: 10.5 g/dL — ABNORMAL LOW (ref 13.0–17.0)
Potassium: 3.5 mmol/L (ref 3.5–5.1)
Sodium: 136 mmol/L (ref 135–145)
TCO2: 19 mmol/L — ABNORMAL LOW (ref 22–32)

## 2023-03-08 LAB — MAGNESIUM: Magnesium: 1.7 mg/dL (ref 1.7–2.4)

## 2023-03-08 LAB — RESP PANEL BY RT-PCR (RSV, FLU A&B, COVID)  RVPGX2
Influenza A by PCR: NEGATIVE
Influenza B by PCR: NEGATIVE
Resp Syncytial Virus by PCR: NEGATIVE
SARS Coronavirus 2 by RT PCR: NEGATIVE

## 2023-03-08 LAB — CBG MONITORING, ED: Glucose-Capillary: 127 mg/dL — ABNORMAL HIGH (ref 70–99)

## 2023-03-08 LAB — BRAIN NATRIURETIC PEPTIDE: B Natriuretic Peptide: 847.7 pg/mL — ABNORMAL HIGH (ref 0.0–100.0)

## 2023-03-08 MED ORDER — PANTOPRAZOLE SODIUM 40 MG PO TBEC
40.0000 mg | DELAYED_RELEASE_TABLET | Freq: Every morning | ORAL | Status: DC
  Administered 2023-03-09 – 2023-03-13 (×5): 40 mg via ORAL
  Filled 2023-03-08 (×5): qty 1

## 2023-03-08 MED ORDER — FUROSEMIDE 20 MG PO TABS: 40.0000 mg | ORAL_TABLET | Freq: Every day | ORAL | Status: DC

## 2023-03-08 MED ORDER — METOPROLOL TARTRATE 25 MG PO TABS: 12.5000 mg | ORAL_TABLET | ORAL | Status: DC

## 2023-03-08 MED ORDER — DIPHENHYDRAMINE HCL 12.5 MG/5ML PO ELIX
12.5000 mg | ORAL_SOLUTION | Freq: Once | ORAL | Status: AC
  Administered 2023-03-08: 12.5 mg via ORAL
  Filled 2023-03-08: qty 10

## 2023-03-08 MED ORDER — ACETAMINOPHEN 650 MG RE SUPP: 650.0000 mg | Freq: Four times a day (QID) | RECTAL | Status: DC | PRN

## 2023-03-08 MED ORDER — SODIUM CHLORIDE 0.9 % IV SOLN
1.0000 g | Freq: Once | INTRAVENOUS | Status: AC
  Administered 2023-03-08: 1 g via INTRAVENOUS
  Filled 2023-03-08: qty 10

## 2023-03-08 MED ORDER — METOPROLOL TARTRATE 25 MG PO TABS: 12.5000 mg | ORAL_TABLET | Freq: Every day | ORAL | Status: DC

## 2023-03-08 MED ORDER — LACTATED RINGERS IV BOLUS
500.0000 mL | Freq: Once | INTRAVENOUS | Status: AC
  Administered 2023-03-08: 500 mL via INTRAVENOUS

## 2023-03-08 MED ORDER — FLUOXETINE HCL 20 MG PO CAPS
20.0000 mg | ORAL_CAPSULE | Freq: Every day | ORAL | Status: DC
  Administered 2023-03-09 – 2023-03-13 (×5): 20 mg via ORAL
  Filled 2023-03-08 (×5): qty 1

## 2023-03-08 MED ORDER — DIPHENHYDRAMINE HCL 12.5 MG/5ML PO ELIX: 12.5000 mg | ORAL_SOLUTION | Freq: Four times a day (QID) | ORAL | Status: DC | PRN

## 2023-03-08 MED ORDER — POLYETHYLENE GLYCOL 3350 17 G PO PACK: 17.0000 g | PACK | Freq: Every day | ORAL | Status: DC | PRN

## 2023-03-08 MED ORDER — CEFTRIAXONE SODIUM 1 G IJ SOLR: 1.0000 g | INTRAMUSCULAR | Status: DC

## 2023-03-08 MED ORDER — MOMETASONE FURO-FORMOTEROL FUM 200-5 MCG/ACT IN AERO
2.0000 | INHALATION_SPRAY | Freq: Two times a day (BID) | RESPIRATORY_TRACT | Status: DC
  Administered 2023-03-09 – 2023-03-13 (×8): 2 via RESPIRATORY_TRACT
  Filled 2023-03-08: qty 8.8

## 2023-03-08 MED ORDER — DOXYCYCLINE HYCLATE 100 MG PO TABS
100.0000 mg | ORAL_TABLET | Freq: Two times a day (BID) | ORAL | Status: DC
  Administered 2023-03-08 – 2023-03-09 (×3): 100 mg via ORAL
  Filled 2023-03-08 (×3): qty 1

## 2023-03-08 MED ORDER — APIXABAN 2.5 MG PO TABS
2.5000 mg | ORAL_TABLET | Freq: Two times a day (BID) | ORAL | Status: DC
  Administered 2023-03-08 – 2023-03-13 (×10): 2.5 mg via ORAL
  Filled 2023-03-08 (×10): qty 1

## 2023-03-08 MED ORDER — DOXYCYCLINE HYCLATE 100 MG PO TABS
100.0000 mg | ORAL_TABLET | Freq: Once | ORAL | Status: AC
  Administered 2023-03-08: 100 mg via ORAL
  Filled 2023-03-08: qty 1

## 2023-03-08 MED ORDER — ROSUVASTATIN CALCIUM 5 MG PO TABS
5.0000 mg | ORAL_TABLET | Freq: Every day | ORAL | Status: DC
  Administered 2023-03-08 – 2023-03-12 (×5): 5 mg via ORAL
  Filled 2023-03-08 (×5): qty 1

## 2023-03-08 MED ORDER — POTASSIUM CHLORIDE CRYS ER 20 MEQ PO TBCR
40.0000 meq | EXTENDED_RELEASE_TABLET | Freq: Once | ORAL | Status: AC
  Administered 2023-03-08: 40 meq via ORAL
  Filled 2023-03-08: qty 2

## 2023-03-08 MED ORDER — TAMSULOSIN HCL 0.4 MG PO CAPS
0.4000 mg | ORAL_CAPSULE | Freq: Every day | ORAL | Status: DC
  Administered 2023-03-08 – 2023-03-12 (×5): 0.4 mg via ORAL
  Filled 2023-03-08 (×5): qty 1

## 2023-03-08 MED ORDER — FINASTERIDE 5 MG PO TABS
5.0000 mg | ORAL_TABLET | Freq: Every day | ORAL | Status: DC
  Administered 2023-03-09 – 2023-03-13 (×5): 5 mg via ORAL
  Filled 2023-03-08 (×5): qty 1

## 2023-03-08 MED ORDER — METOPROLOL TARTRATE 25 MG PO TABS
12.5000 mg | ORAL_TABLET | Freq: Every day | ORAL | Status: DC
  Administered 2023-03-08: 12.5 mg via ORAL
  Filled 2023-03-08: qty 1

## 2023-03-08 MED ORDER — SODIUM CHLORIDE 0.9 % IV SOLN
1.0000 g | INTRAVENOUS | Status: DC
  Administered 2023-03-09 – 2023-03-10 (×2): 1 g via INTRAVENOUS
  Filled 2023-03-08 (×2): qty 10

## 2023-03-08 MED ORDER — IPRATROPIUM BROMIDE 0.02 % IN SOLN
0.5000 mg | Freq: Once | RESPIRATORY_TRACT | Status: AC
  Administered 2023-03-08: 0.5 mg via RESPIRATORY_TRACT
  Filled 2023-03-08: qty 2.5

## 2023-03-08 MED ORDER — IOHEXOL 350 MG/ML SOLN
75.0000 mL | Freq: Once | INTRAVENOUS | Status: AC | PRN
  Administered 2023-03-08: 75 mL via INTRAVENOUS

## 2023-03-08 MED ORDER — ACETAMINOPHEN 325 MG PO TABS
650.0000 mg | ORAL_TABLET | Freq: Four times a day (QID) | ORAL | Status: DC | PRN
  Administered 2023-03-08 – 2023-03-11 (×6): 650 mg via ORAL
  Filled 2023-03-08 (×7): qty 2

## 2023-03-08 MED ORDER — SODIUM CHLORIDE 0.9% FLUSH
3.0000 mL | Freq: Two times a day (BID) | INTRAVENOUS | Status: DC
  Administered 2023-03-08 – 2023-03-13 (×10): 3 mL via INTRAVENOUS

## 2023-03-08 MED ORDER — PANTOPRAZOLE SODIUM 40 MG PO TBEC
40.0000 mg | DELAYED_RELEASE_TABLET | ORAL | Status: AC
  Administered 2023-03-08: 40 mg via ORAL
  Filled 2023-03-08: qty 1

## 2023-03-08 NOTE — ED Triage Notes (Addendum)
Daughter stated, last Saturday he started having a bd cough, low grade fever, and not eating but very little. He is disoriented, he has a catheter all the time he can't pee on his on since March.

## 2023-03-08 NOTE — Progress Notes (Signed)
   Patient's nurse reported that patient blood pressure has been dropped and MAP was below 65 and patient received 500 mL of bolus with that blood pressure has been improved to 92/77 and MAP is 70.  Patient received metoprolol 12.5 mg earlier in the evening.  Holding further metoprolol and Lasix on avoid until blood pressure improves.   Tereasa Coop, MD [facility] 03/08/2023, 9:03 PM

## 2023-03-08 NOTE — H&P (Signed)
History and Physical   Evan [name] WUJ:[id number] DOB: [birth year] DOA: 03/08/2023  PCP: Donita Brooks, MD   Patient coming from: Home  Chief Complaint: Shortness of breath, altered mental status  HPI: Evan [name] is a 89 y.o. male with medical history significant of hypertension, hyperlipidemia, GERD, CKD 3, BPH, anemia, atrial fibrillation, subdural hematoma, CVA, CAD status post CABG, QT prolongation, dementia, sundowning, hearing loss, anxiety, depression, chronic respiratory failure with hypoxia, COPD, lung cancer, radiation pneumonitis, rheumatoid arthritis, PE presenting with shortness of breath and altered mental status.  History obtained with assistance of daughter due to patient's hard of hearing, baseline dementia, reported altered mental status.  Family reports patient was exposed to 2 people who were found later to have pneumonia at Thanksgiving.  Now, for the past 2 days patient has had a low-grade fever around 99.5 and has been reporting some shortness of breath as well as orthopnea.  Additionally has developed decreased p.o. intake.  Does have some baseline tremors which have been worse the last day or 2.  No reported chills, chest pain, abdominal pain, constipation, diarrhea, nausea, vomiting. ED Course: Vital signs in the ED notable for blood pressure in the 110s to 130s systolic, respiratory rate in the 20s to 30s.  Lab workup included CMP with potassium 3.4, bicarb 18, BUN 20, creatinine stable at 1.53, albumin 3.2, T. bili 2.6.  CBC notable for leukocytosis to 20.6, he hemoglobin stable at 10.4, platelets stable at 80.  BNP elevated to 847.  Lactic acid borderline at 2.1 and repeat pending.  Respiratory panel for flu COVID and RSV negative.  Blood cultures pending.  Chest x-ray with possible recurrent airspace disease suspicious for pneumonia and chronic L1 compression fracture noted.  CTA PE study ordered and is pending.  Patient received ceftriaxone, doxycycline, 500 cc IV  fluids, 40 mEq p.o. potassium, Benadryl.  Review of Systems: As per HPI otherwise all other systems reviewed and are negative.  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   Altered mental status 08/21/2018   Anginal chest pain at rest [facility])    Chronic non, controlled on Lorazepam   Anxiety    Arthritis    "all over"    BPH (benign prostatic hyperplasia)    CAD (coronary artery disease)    Cellulitis of arm, right 06/01/2018   Cellulitis of left axilla    Chronic lower back pain    Complication of anesthesia    "problems making water afterwards"   Depression    DJD (degenerative joint disease)    Esophageal ulcer without bleeding    bid ppi indefinitely   Fall 07/16/2018   Family history of adverse reaction to anesthesia    "children w/PONV"   GERD (gastroesophageal reflux disease)    Headache    "couple/week maybe" (09/04/2015)   Hemochromatosis    Possible, elevated iron stores, hook-like osteophytes on hand films, normal LFTs   Hepatitis    "yellow jaundice as a baby"   History of ASCVD    MULTIVESSEL   Hyperlipidemia    Hypertension    Hypotension    Mild aortic stenosis 06/23/2021   Osteoarthritis    Pneumonia    "several times; got a little now" (09/04/2015)   Rash and nonspecific skin eruption 06/01/2018   Rheumatoid arthritis (  HCC)    RSV (respiratory syncytial virus pneumonia) 05/15/2022   Subdural hematoma (HCC)    small after fall 09/2016-plavix held, neurosurgery consulted.  Asymptomatic.     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 he failed PO Eliquis    Past Surgical History:  Procedure Laterality Date   ARM SKIN LESION BIOPSY / EXCISION Left 09/02/2015   CATARACT EXTRACTION W/ INTRAOCULAR LENS  IMPLANT, BILATERAL  Bilateral    CHOLECYSTECTOMY OPEN     CORNEAL TRANSPLANT Bilateral    "one at [facility]; one at [facility]"   CORONARY ANGIOPLASTY WITH STENT PLACEMENT     CORONARY ARTERY BYPASS GRAFT  1999   DESCENDING AORTIC ANEURYSM REPAIR W/ STENT     DE stent ostium into the right radial free graft at OM-, 02-2007   ESOPHAGOGASTRODUODENOSCOPY N/A 11/09/2014   Procedure: ESOPHAGOGASTRODUODENOSCOPY (EGD);  Surgeon: Dorena Cookey, MD;  Location: [facility] ENDOSCOPY;  Service: Endoscopy;  Laterality: N/A;   INGUINAL HERNIA REPAIR     JOINT REPLACEMENT     LEFT HEART CATH AND CORS/GRAFTS ANGIOGRAPHY N/A 06/27/2017   Procedure: LEFT HEART CATH AND CORS/GRAFTS ANGIOGRAPHY;  Surgeon: Lennette Bihari, MD;  Location: [facility] INVASIVE CV LAB;  Service: Cardiovascular;  Laterality: N/A;   NASAL SINUS SURGERY     SHOULDER OPEN ROTATOR CUFF REPAIR Right    TOTAL KNEE ARTHROPLASTY Bilateral     Social History  reports that he has quit smoking. His smoking use included cigarettes. He has been exposed to tobacco smoke. He has quit using smokeless tobacco.  His smokeless tobacco use included chew. He reports that he does not drink alcohol and does not use drugs.  Allergies  Allergen Reactions   Feldene [Piroxicam] Rash    Blistering rash     Neomycin-Polymyxin-Hc Itching   Statins Other (See Comments)    Myalgias   Latex Rash    Band Aid   Tequin [Gatifloxacin] Swelling and Anxiety   Valium [Diazepam] Other (See Comments)    Dizziness    Vibramycin [Doxycycline] Itching and Nausea Only    Family History  Problem Relation Age of Onset   Arthritis-Osteo Sister    Heart attack Brother    Heart attack Other    Cancer Neg Hx   Reviewed on admission  Prior to Admission medications   Medication Sig Start Date End Date Taking? Authorizing Provider  acetaminophen (TYLENOL) 500 MG tablet Take 500 mg by mouth 2 (two) times daily as needed for mild pain, moderate pain, fever or headache.   Yes [provider]  apixaban  (ELIQUIS) 2.5 MG TABS tablet Take 1 tablet (2.5 mg total) by mouth 2 (two) times daily. 10/14/22  Yes Gaston Islam., NP  budesonide-formoterol [facility]) 160-4.5 MCG/ACT inhaler Inhale 2 puffs into the lungs in the morning and at bedtime. 05/17/22  Yes [name], York Ram, MD  ferrous sulfate (FEROSUL) 325 (65 FE) MG tablet TAKE 1 TABLET EVERY OTHER DAY 08/30/22  Yes Donita Brooks, MD  finasteride (PROSCAR) 5 MG tablet Take 1 tablet (5 mg total) by mouth daily. 04/11/22  Yes Donita Brooks, MD  FLUoxetine (PROZAC) 20 MG capsule TAKE 1 CAPSULE EVERY DAY 06/01/22  Yes Donita Brooks, MD  folic acid (FOLVITE) 1 MG tablet Take 1 tablet (1 mg total) by mouth daily. 08/18/22  Yes Donita Brooks, MD  furosemide (LASIX) 40 MG tablet Take 1 tablet (40 mg total) by mouth daily. Patient taking differently: Take 40 mg by mouth at  bedtime. 11/30/22 05/13/23 Yes Donita Brooks, MD  metoprolol tartrate (LOPRESSOR) 25 MG tablet Take 12.5 mg by mouth See admin instructions. Take 12.5 mg (1/2 tablet) daily. May take an additional 1/2 tablet daily if needed for chest pain or elevated blood pressure.   Yes [provider]  Multiple Vitamins-Minerals (MENS 50+ MULTIVITAMIN) TABS Take 1 tablet by mouth daily.   Yes [provider]  pantoprazole (PROTONIX) 40 MG tablet TAKE 1 TABLET EVERY MORNING 02/06/23  Yes Donita Brooks, MD  rosuvastatin (CRESTOR) 5 MG tablet TAKE 1 TABLET EVERY DAY Patient taking differently: Take 5 mg by mouth at bedtime. 10/24/22  Yes Corky Crafts, MD  senna-docusate (STIMULANT LAXATIVE) 8.6-50 MG tablet Take 2 tablets by mouth 2 (two) times daily as needed for constipation 07/21/22  Yes   tamsulosin (FLOMAX) 0.4 MG CAPS capsule Take 1 capsule (0.4 mg total) by mouth daily with supper. Patient taking differently: Take 0.4 mg by mouth at bedtime. 01/26/23  Yes Donita Brooks, MD  Zinc Oxide (TRIPLE PASTE) 12.8 % ointment Apply topically 4 (four) times  daily as needed (skin barrier). Patient taking differently: Apply 1 Application topically 2 (two) times daily as needed (skin barrier). 05/03/22  Yes Zigmund Daniel., MD    Physical Exam: Vitals:   03/08/23 1535 03/08/23 1545 03/08/23 1551 03/08/23 1551  BP: 122/70 122/70    Pulse: 92 96    Resp: (!) 42 (!) 27    Temp:   (!) 101 F (38.3 C) (!) 101 F (38.3 C)  TempSrc:   Temporal Temporal  SpO2: 95% 94%      Physical Exam Constitutional:      General: He is not in acute distress.    Appearance: Normal appearance.  HENT:     Head: Normocephalic and atraumatic.     Mouth/Throat:     Mouth: Mucous membranes are moist.     Pharynx: Oropharynx is clear.  Eyes:     Extraocular Movements: Extraocular movements intact.     Pupils: Pupils are equal, round, and reactive to light.  Cardiovascular:     Rate and Rhythm: Regular rhythm. Tachycardia present.     Pulses: Normal pulses.     Heart sounds: Normal heart sounds.  Pulmonary:     Effort: Pulmonary effort is normal. Tachypnea present. No respiratory distress.     Breath sounds: Examination of the right-middle field reveals rhonchi and rales. Examination of the right-lower field reveals decreased breath sounds and rales. Decreased breath sounds, rhonchi and rales present.  Abdominal:     General: Bowel sounds are normal. There is no distension.     Palpations: Abdomen is soft.     Tenderness: There is no abdominal tenderness.  Musculoskeletal:        General: No swelling or deformity.  Skin:    General: Skin is warm and dry.  Neurological:     General: No focal deficit present.     Mental Status: Mental status is at baseline.    Labs on Admission: I have personally reviewed following labs and imaging studies  CBC: Recent Labs  Lab 03/08/23 1033 03/08/23 1152  WBC 20.6*  --   NEUTROABS 17.8*  --   HGB 10.4* 10.5*  HCT 32.2* 31.0*  MCV 103.2*  --   PLT 80*  --     Basic Metabolic Panel: Recent Labs  Lab  03/08/23 1130 03/08/23 1152  NA 135 136  K 3.4* 3.5  CL 104  104  CO2 18*  --   GLUCOSE 119* 116*  BUN 24* 27*  CREATININE 1.53* 1.50*  CALCIUM 9.1  --   MG 1.7  --     GFR: CrCl cannot be calculated (Unknown ideal weight.).  Liver Function Tests: Recent Labs  Lab 03/08/23 1130  AST 18  ALT 10  ALKPHOS 72  BILITOT 2.6*  PROT 6.9  ALBUMIN 3.2*    Urine analysis:    Component Value Date/Time   COLORURINE YELLOW 11/05/2022 1530   APPEARANCEUR CLEAR 11/05/2022 1530   LABSPEC 1.018 11/05/2022 1530   PHURINE 5.5 11/05/2022 1530   GLUCOSEU NEGATIVE 11/05/2022 1530   HGBUR MODERATE (A) 11/05/2022 1530   BILIRUBINUR NEGATIVE 11/05/2022 1530   KETONESUR NEGATIVE 11/05/2022 1530   PROTEINUR TRACE (A) 11/05/2022 1530   UROBILINOGEN 0.2 07/13/2014 1804   NITRITE NEGATIVE 11/05/2022 1530   LEUKOCYTESUR LARGE (A) 11/05/2022 1530    Radiological Exams on Admission: DG Chest 2 View  Result Date: 03/08/2023 CLINICAL DATA:  Cough, hemoptysis, fever. EXAM: CHEST - 2 VIEW COMPARISON:  Radiographs 09/28/2022 and 09/27/2022. Chest CTA 04/28/2022. FINDINGS: 1059 hours. The heart size and mediastinal contours are stable status post median sternotomy and CABG. There is mild cardiomegaly and aortic atherosclerosis. Chronic central airway thickening with possible recurrent airspace disease at the right lung base. Trace bilateral pleural effusions are evident on the lateral view. No evidence of pneumothorax. Chronic L1 compression fracture appears unchanged from lumbar MRI 09/06/2019. No acute osseous findings are identified. Lucency laterally in the right upper quadrant the abdomen on the frontal examination corresponds with bowel on the lateral view. No definite pneumoperitoneum. IMPRESSION: 1. Possible recurrent airspace disease at the right lung base, suspicious for pneumonia. Trace bilateral pleural effusions. 2. Chronic L1 compression fracture. Electronically Signed   By: Carey Bullocks M.D.    On: 03/08/2023 11:38    EKG: Independently reviewed.  Sinus rhythm at 75 bpm.  Some baseline wander and baseline artifact.  Nonspecific T wave changes.  QTc prolonged at 501.  Assessment/Plan Principal Problem:   PNA (pneumonia) Active Problems:   Chronic respiratory failure, unsp w hypoxia or hypercapnia (HCC)   History of pulmonary embolism   COPD (chronic obstructive pulmonary disease) (HCC)   CKD (chronic kidney disease), stage III (HCC)   Essential hypertension   Benign prostatic hyperplasia without lower urinary tract symptoms   Hyperlipidemia, unspecified   S/P CABG (coronary artery bypass graft)   Pulmonary embolism (HCC)   Permanent atrial fibrillation (HCC)   Gastro-esophageal reflux disease without esophagitis   Asthma, chronic   CAD (coronary artery disease)   GAD (generalized anxiety disorder)   Rheumatoid arthritis, unspecified (HCC)   Radiation pneumonitis (HCC)   Personal history of other venous thrombosis and embolism   Thalamic hemorrhage (HCC)   History of lung cancer   Depression   Hemiparesis due to old stroke (HCC)   Presence of coronary angioplasty implant and graft   Dementia without behavioral disturbance (HCC)   Anemia   Pneumonia Chronic respiratory failure? > Patient with background of chronic respiratory failure? (Not on oxygen), history of lung cancer, COPD, radiation pneumonitis, pulmonary embolism presenting with shortness of breath, imaging concerning for pneumonia.  Also noted to have leukocytosis 20.6.  Low-grade fevers at home.  Lactic acid borderline at 2.1 in ED. > Some coughing with eating and RLL PNA, will get SLP eval. > Started on ceftriaxone and doxycycline in the ED.  Does have allergy listed to doxycycline but it  is mild and consist of itching and nausea only with no reaction reported in the ED thus far.  Azithromycin avoided due to prolonged QTc. - Monitor on telemetry overnight - Continue with ceftriaxone and doxycycline - Trend  fever curve and WBC - SLP eval and treat - Supportive care  COPD versus asthma > History of obstructive lung disease listed in chart and there is some question as to whether this was COPD or asthma - Continue home Symbicort  History of lung cancer status post radiation History of radiation pneumonitis - Noted  Hypertension - Continue home Lasix, metoprolol  Hyperlipidemia - Continue rosuvastatin  GERD - Continue home PPI  CKD 3 Hypokalemia > Creatinine 1.5, near baseline of 1.3 in the ED.  Mild hyperkalemia with potassium 3.4 in the ED. > Received 40 mEq p.o. potassium in the ED.  - Continue with home diuretic - Trend renal function and electrolytes   BPH - Continue home tamsulosin, finasteride - Has chronic Foley - Change in urine, f/u U/A  Anemia > Hemoglobin stable 10.4 - Continue to trend CBC  Atrial fibrillation - Currently in sinus rhythm - Continue home metoprolol, Eliquis  History of CVA - Continue home rosuvastatin, Eliquis  CAD > Status post CABG - Continue home metoprolol, rosuvastatin, Eliquis  QTc prolongation > Currently 5.1 in the ED. - Monitoring on telemetry - A.m. EKG  Anxiety Depression Dementia > Per chart has had some history of sundowning in the past.  Does have difficulty hearing which could contribute. - Continue home fluoxetine  History of DVT/PE - Continue home Eliquis  DVT prophylaxis: Eliquis Code Status:   Full Family Communication:  Updated at bedside  Disposition Plan:   Patient is from:  Home  Anticipated DC to:  Home  Anticipated DC date:  1 to 3 days  Anticipated DC barriers: None  Consults called:  None Admission status:  Observation, telemetry  Severity of Illness: The appropriate patient status for this patient is OBSERVATION. Observation status is judged to be reasonable and necessary in order to provide the required intensity of service to ensure the patient's safety. The patient's presenting symptoms,  physical exam findings, and initial radiographic and laboratory data in the context of their medical condition is felt to place them at decreased risk for further clinical deterioration. Furthermore, it is anticipated that the patient will be medically stable for discharge from the hospital within 2 midnights of admission.    Synetta Fail MD [facility]  How to contact the [facility] Attending or Consulting provider 7A - 7P or covering provider during after hours 7P -7A, for this patient?   Check the care team in [facility] and look for a) attending/consulting [facility] provider listed and b) the [facility] team listed Log into [email or web address] and use [place] universal password to access. If you do not have the password, please contact the hospital operator. Locate the [facility] provider you are looking for under [facility] and page to a number that you can be directly reached. If you still have difficulty reaching the provider, please page the [facility] (Director on Call) for the Hospitalists listed on amion for assistance.  03/08/2023, 3:56 PM

## 2023-03-08 NOTE — ED Provider Notes (Signed)
[place] EMERGENCY DEPARTMENT AT [facility] Provider Note   CSN: [id number] Arrival date & time: 03/08/23  6578     History  Chief Complaint  Patient presents with   Shortness of Breath   Cough   not eating   Altered Mental Status   Hoarse    Evan [name] is a 89 y.o. male.  HPI 89 year old male presents with weakness and concern for pneumonia.  History is primarily from daughter at the bedside who takes care of him.  Patient was around 2 people who had pneumonia on Thanksgiving.  2 days later on November 30, he started developing a cough.  Is mostly been clear but last night he had some blood in his sputum.  Since 2 days ago he is also had low-grade fevers, around 99.5.  He has shortness of breath which is significantly worse when he lays flat.  No significant leg swelling.  No complaints of chest pain.  He is not vomiting but he is eating and drinking about half and last night he had about half of his normal urine output in his Foley catheter.  No complaints of pain. He is on Eliquis for a prior PE  Home Medications Prior to Admission medications   Medication Sig Start Date End Date Taking? Authorizing Provider  acetaminophen (TYLENOL) 500 MG tablet Take 500 mg by mouth 2 (two) times daily as needed for mild pain, moderate pain, fever or headache.   Yes [provider]  apixaban (ELIQUIS) 2.5 MG TABS tablet Take 1 tablet (2.5 mg total) by mouth 2 (two) times daily. 10/14/22  Yes Gaston Islam., NP  budesonide-formoterol [facility]) 160-4.5 MCG/ACT inhaler Inhale 2 puffs into the lungs in the morning and at bedtime. 05/17/22  Yes [name], York Ram, MD  ferrous sulfate (FEROSUL) 325 (65 FE) MG tablet TAKE 1 TABLET EVERY OTHER DAY 08/30/22  Yes Donita Brooks, MD  finasteride (PROSCAR) 5 MG tablet Take 1 tablet (5 mg total) by mouth daily. 04/11/22  Yes Donita Brooks, MD  FLUoxetine (PROZAC) 20 MG capsule TAKE 1 CAPSULE EVERY DAY 06/01/22  Yes Donita Brooks, MD  folic acid (FOLVITE) 1 MG tablet Take 1 tablet (1 mg total) by mouth daily. 08/18/22  Yes Donita Brooks, MD  furosemide (LASIX) 40 MG tablet Take 1 tablet (40 mg total) by mouth daily. Patient taking differently: Take 40 mg by mouth at bedtime. 11/30/22 05/13/23 Yes Donita Brooks, MD  metoprolol tartrate (LOPRESSOR) 25 MG tablet Take 12.5 mg by mouth See admin instructions. Take 12.5 mg (1/2 tablet) daily. May take an additional 1/2 tablet daily if needed for chest pain or elevated blood pressure.   Yes [provider]  Multiple Vitamins-Minerals (MENS 50+ MULTIVITAMIN) TABS Take 1 tablet by mouth daily.   Yes [provider]  pantoprazole (PROTONIX) 40 MG tablet TAKE 1 TABLET EVERY MORNING 02/06/23  Yes Donita Brooks, MD  rosuvastatin (CRESTOR) 5 MG tablet TAKE 1 TABLET EVERY DAY Patient taking differently: Take 5 mg by mouth at bedtime. 10/24/22  Yes Corky Crafts, MD  senna-docusate (STIMULANT LAXATIVE) 8.6-50 MG tablet Take 2 tablets by mouth 2 (two) times daily as needed for constipation 07/21/22  Yes   tamsulosin (FLOMAX) 0.4 MG CAPS capsule Take 1 capsule (0.4 mg total) by mouth daily with supper. Patient taking differently: Take 0.4 mg by mouth at bedtime. 01/26/23  Yes Donita Brooks, MD  Zinc Oxide (TRIPLE PASTE) 12.8 %  ointment Apply topically 4 (four) times daily as needed (skin barrier). Patient taking differently: Apply 1 Application topically 2 (two) times daily as needed (skin barrier). 05/03/22  Yes Zigmund Daniel., MD      Allergies    Feldene [piroxicam], Neomycin-polymyxin-hc, Statins, Latex, Tequin [gatifloxacin], Valium [diazepam], and Vibramycin [doxycycline]    Review of Systems   Review of Systems  Constitutional:  Positive for fever.  Respiratory:  Positive for cough and shortness of breath.   Cardiovascular:  Negative for chest pain and leg swelling.  Gastrointestinal:  Negative for vomiting.  Genitourinary:   Positive for decreased urine volume.    Physical Exam Updated Vital Signs BP 128/78   Pulse 83   Temp 98.4 F (36.9 C)   Resp (!) 30   SpO2 95%  Physical Exam Vitals and nursing note reviewed.  Constitutional:      General: He is not in acute distress.    Appearance: He is well-developed. He is not ill-appearing or diaphoretic.  HENT:     Head: Normocephalic and atraumatic.     Mouth/Throat:     Mouth: Mucous membranes are dry.  Cardiovascular:     Rate and Rhythm: Normal rate and regular rhythm.     Heart sounds: Normal heart sounds.  Pulmonary:     Effort: Pulmonary effort is normal.     Comments: Faint basilar crackles Abdominal:     Palpations: Abdomen is soft.     Tenderness: There is no abdominal tenderness.  Musculoskeletal:     Right lower leg: No edema.     Left lower leg: No edema.  Skin:    General: Skin is warm and dry.  Neurological:     Mental Status: He is alert.     ED Results / Procedures / Treatments   Labs (all labs ordered are listed, but only abnormal results are displayed) Labs Reviewed  COMPREHENSIVE METABOLIC PANEL - Abnormal; Notable for the following components:      Result Value   Potassium 3.4 (*)    CO2 18 (*)    Glucose, Bld 119 (*)    BUN 24 (*)    Creatinine, Ser 1.53 (*)    Albumin 3.2 (*)    Total Bilirubin 2.6 (*)    GFR, Estimated 42 (*)    All other components within normal limits  CBC WITH DIFFERENTIAL/PLATELET - Abnormal; Notable for the following components:   WBC 20.6 (*)    RBC 3.12 (*)    Hemoglobin 10.4 (*)    HCT 32.2 (*)    MCV 103.2 (*)    Platelets 80 (*)    Neutro Abs 17.8 (*)    Monocytes Absolute 1.5 (*)    Abs Immature Granulocytes 0.25 (*)    All other components within normal limits  BRAIN NATRIURETIC PEPTIDE - Abnormal; Notable for the following components:   B Natriuretic Peptide 847.7 (*)    All other components within normal limits  CBG MONITORING, ED - Abnormal; Notable for the following  components:   Glucose-Capillary 127 (*)    All other components within normal limits  I-STAT CHEM 8, ED - Abnormal; Notable for the following components:   BUN 27 (*)    Creatinine, Ser 1.50 (*)    Glucose, Bld 116 (*)    Calcium, Ion 1.05 (*)    TCO2 19 (*)    Hemoglobin 10.5 (*)    HCT 31.0 (*)    All other components within normal limits  I-STAT CG4 LACTIC ACID, ED - Abnormal; Notable for the following components:   Lactic Acid, Venous 2.1 (*)    All other components within normal limits  RESP PANEL BY RT-PCR (RSV, FLU A&B, COVID)  RVPGX2  CULTURE, BLOOD (ROUTINE X 2)  CULTURE, BLOOD (ROUTINE X 2)  MAGNESIUM  I-STAT CG4 LACTIC ACID, ED    EKG EKG Interpretation Date/Time:  Wednesday March 08 2023 11:34:13 EST Ventricular Rate:  75 PR Interval:    QRS Duration:  106 QT Interval:  448 QTC Calculation: 501 R Axis:   -26  Text Interpretation: likely sinus rhythm Borderline left axis deviation Anteroseptal infarct, age indeterminate Lateral leads are also involved Prolonged QT interval Poor data quality in current ECG precludes serial comparison Confirmed by Pricilla Loveless [phone]) on 03/08/2023 11:46:51 AM  Radiology DG Chest 2 View  Result Date: 03/08/2023 CLINICAL DATA:  Cough, hemoptysis, fever. EXAM: CHEST - 2 VIEW COMPARISON:  Radiographs 09/28/2022 and 09/27/2022. Chest CTA 04/28/2022. FINDINGS: 1059 hours. The heart size and mediastinal contours are stable status post median sternotomy and CABG. There is mild cardiomegaly and aortic atherosclerosis. Chronic central airway thickening with possible recurrent airspace disease at the right lung base. Trace bilateral pleural effusions are evident on the lateral view. No evidence of pneumothorax. Chronic L1 compression fracture appears unchanged from lumbar MRI 09/06/2019. No acute osseous findings are identified. Lucency laterally in the right upper quadrant the abdomen on the frontal examination corresponds with bowel on the  lateral view. No definite pneumoperitoneum. IMPRESSION: 1. Possible recurrent airspace disease at the right lung base, suspicious for pneumonia. Trace bilateral pleural effusions. 2. Chronic L1 compression fracture. Electronically Signed   By: Carey Bullocks M.D.   On: 03/08/2023 11:38    Procedures .Critical Care  Performed by: Pricilla Loveless, MD Authorized by: Pricilla Loveless, MD   Critical care provider statement:    Critical care time (minutes):  35   Critical care time was exclusive of:  Separately billable procedures and treating other patients   Critical care was necessary to treat or prevent imminent or life-threatening deterioration of the following conditions:  Respiratory failure   Critical care was time spent personally by me on the following activities:  Development of treatment plan with patient or surrogate, discussions with consultants, evaluation of patient's response to treatment, examination of patient, ordering and review of laboratory studies, ordering and review of radiographic studies, ordering and performing treatments and interventions, pulse oximetry, re-evaluation of patient's condition and review of old charts     Medications Ordered in ED Medications  lactated ringers bolus 500 mL (0 mLs Intravenous Stopped 03/08/23 1252)  cefTRIAXone (ROCEPHIN) 1 g in sodium chloride 0.9 % 100 mL IVPB (0 g Intravenous Stopped 03/08/23 1507)  potassium chloride SA (KLOR-CON M) CR tablet 40 mEq (40 mEq Oral Given 03/08/23 1435)  doxycycline (VIBRA-TABS) tablet 100 mg (100 mg Oral Given 03/08/23 1435)  diphenhydrAMINE (BENADRYL) 12.5 MG/5ML elixir 12.5 mg (12.5 mg Oral Given 03/08/23 1435)  iohexol (OMNIPAQUE) 350 MG/ML injection 75 mL (75 mLs Intravenous Contrast Given 03/08/23 1435)    ED Course/ Medical Decision Making/ A&P                                 Medical Decision Making Amount and/or Complexity of Data Reviewed Labs: ordered.    Details: Significant leukocytosis of  20.  Mild lactate of 2.1.  Mild bump in creatinine. Radiology: ordered  and independent interpretation performed.    Details: Probable right lower lobe pneumonia on x-ray but definite right lower lobe pneumonia on CT. ECG/medicine tests: ordered and independent interpretation performed.    Details: No ischemia  Risk Prescription drug management.   Patient presents with signs and symptoms of pneumonia.  He is not hypoxic but is a little tachypneic.  Otherwise his vitals are unremarkable.  Likely pneumonia and with significant laboratory factors such as elevated lactate and leukocytosis, he was started on antibiotics.  I did discuss with pharmacy and given his allergies and QT issues, she recommends doxycycline with some low-dose Benadryl.  Did not seem like this was a true allergy.  He has tolerated this well.  CTA was obtained given his history as well as some hemoptysis and his pneumonia is much clearer on the CT.  He will need admission. Dr. Alinda Money to admit.        Final Clinical Impression(s) / ED Diagnoses Final diagnoses:  Community acquired pneumonia of right lower lobe of lung    Rx / DC Orders ED Discharge Orders     None         Pricilla Loveless, MD 03/08/23 [phone]

## 2023-03-08 NOTE — ED Notes (Signed)
ED TO INPATIENT HANDOFF REPORT  ED Nurse Name and Phone #: Darral Dash RN [phone]  S Name/Age/Gender Evan [name] 89 y.o. male Room/Bed: 035C/035C  Code Status   Code Status: Full Code  Home/SNF/Other Home Patient oriented to: self, place, and time Is this baseline? Yes   Triage Complete: Triage complete  Chief Complaint PNA (pneumonia) [J18.9]  Triage Note Daughter stated, last Saturday he started having a bd cough, low grade fever, and not eating but very little. He is disoriented, he has a catheter all the time he can't pee on his on since March.   Allergies Allergies  Allergen Reactions   Feldene [Piroxicam] Rash    Blistering rash     Neomycin-Polymyxin-Hc Itching   Statins Other (See Comments)    Myalgias   Latex Rash    Band Aid   Tequin [Gatifloxacin] Swelling and Anxiety   Valium [Diazepam] Other (See Comments)    Dizziness    Vibramycin [Doxycycline] Itching and Nausea Only    Level of Care/Admitting Diagnosis ED Disposition     ED Disposition  Admit   Condition  --   Comment  Hospital Area: [facility] [[id number]]  Level of Care: Telemetry Medical [104]  May place patient in observation at [facility] or [facility] if equivalent level of care is available:: No  Covid Evaluation: Asymptomatic - no recent exposure (last 10 days) testing not required  Diagnosis: PNA (pneumonia) [[id number]]  Admitting Physician: Synetta Fail [[id number]]  Attending Physician: Synetta Fail [[id number]]          B Medical/Surgery History 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   Altered mental status 08/21/2018   Anginal chest pain at rest [facility])    Chronic non, controlled on Lorazepam   Anxiety    Arthritis    "all  over"    BPH (benign prostatic hyperplasia)    CAD (coronary artery disease)    Cellulitis of arm, right 06/01/2018   Cellulitis of left axilla    Chronic lower back pain    Complication of anesthesia    "problems making water afterwards"   Depression    DJD (degenerative joint disease)    Esophageal ulcer without bleeding    bid ppi indefinitely   Fall 07/16/2018   Family history of adverse reaction to anesthesia    "children w/PONV"   GERD (gastroesophageal reflux disease)    Headache    "couple/week maybe" (09/04/2015)   Hemochromatosis    Possible, elevated iron stores, hook-like osteophytes on hand films, normal LFTs   Hepatitis    "yellow jaundice as a baby"   History of ASCVD    MULTIVESSEL   Hyperlipidemia    Hypertension    Hypotension    Mild aortic stenosis 06/23/2021   Osteoarthritis    Pneumonia    "several times; got a little now" (09/04/2015)   Rash and nonspecific skin eruption 06/01/2018   Rheumatoid arthritis (HCC)    RSV (respiratory syncytial virus pneumonia) 05/15/2022   Subdural hematoma (HCC)    small after fall 09/2016-plavix held, neurosurgery consulted.  Asymptomatic.     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 he failed PO Eliquis   Past Surgical History:  Procedure Laterality Date   ARM SKIN LESION BIOPSY / EXCISION  Left 09/02/2015   CATARACT EXTRACTION W/ INTRAOCULAR LENS  IMPLANT, BILATERAL Bilateral    CHOLECYSTECTOMY OPEN     CORNEAL TRANSPLANT Bilateral    "one at [facility]; one at [facility]"   CORONARY ANGIOPLASTY WITH STENT PLACEMENT     CORONARY ARTERY BYPASS GRAFT  1999   DESCENDING AORTIC ANEURYSM REPAIR W/ STENT     DE stent ostium into the right radial free graft at OM-, 02-2007   ESOPHAGOGASTRODUODENOSCOPY N/A 11/09/2014   Procedure: ESOPHAGOGASTRODUODENOSCOPY (EGD);  Surgeon: Dorena Cookey, MD;  Location: [facility];  Service: Endoscopy;  Laterality: N/A;   INGUINAL HERNIA REPAIR     JOINT REPLACEMENT     LEFT HEART CATH  AND CORS/GRAFTS ANGIOGRAPHY N/A 06/27/2017   Procedure: LEFT HEART CATH AND CORS/GRAFTS ANGIOGRAPHY;  Surgeon: Lennette Bihari, MD;  Location: [facility] INVASIVE CV LAB;  Service: Cardiovascular;  Laterality: N/A;   NASAL SINUS SURGERY     SHOULDER OPEN ROTATOR CUFF REPAIR Right    TOTAL KNEE ARTHROPLASTY Bilateral      A IV Location/Drains/Wounds Patient Lines/Drains/Airways Status     Active Line/Drains/Airways     Name Placement date Placement time Site Days   Peripheral IV 03/08/23 22 G Anterior;Left Forearm 03/08/23  1139  Forearm  less than 1   Peripheral IV 03/08/23 20 G Anterior;Left;Proximal Forearm 03/08/23  1304  Forearm  less than 1   Urethral Catheter ashley towsley RN 05/12/22  0258  --  300   Urethral Catheter Non-latex 09/26/22  --  Non-latex  163   Urethral Catheter Brandi Elliott Coude 16 Fr. 11/05/22  0215  Coude  123   Pressure Injury 08/26/18 Stage II -  Partial thickness loss of dermis presenting as a shallow open ulcer with a red, pink wound bed without slough. small circular wounds on both buttocks, 2 on right, 1 on left 08/26/18  2020  -- 1655   Pressure Injury 03/12/22 Coccyx Mid Stage 1 -  Intact skin with non-blanchable redness of a localized area usually over a bony prominence. 03/12/22  1800  -- 361   Pressure Injury 04/28/22 Sacrum Mid Stage 1 -  Intact skin with non-blanchable redness of a localized area usually over a bony prominence. 04/28/22  2030  -- 314   Pressure Injury 09/28/22 Coccyx Right;Left Stage 1 -  Intact skin with non-blanchable redness of a localized area usually over a bony prominence. 09/28/22  0215  -- 161   Wound / Incision (Open or Dehisced) 08/12/18 Other (Comment) Heel Posterior;Right Deep Tissue Injurry 08/12/18  0930  Heel  1669   Wound / Incision (Open or Dehisced) 08/12/18 Other (Comment) Ear Right Device Associated Pressure Ulcer  08/12/18  1640  Ear  1669            Intake/Output Last 24 hours No intake or output data in the 24  hours ending 03/08/23 2102  Labs/Imaging Results for orders placed or performed during the hospital encounter of 03/08/23 (from the past 48 hour(s))  CBG monitoring, ED     Status: Abnormal   Collection Time: 03/08/23 10:30 AM  Result Value Ref Range   Glucose-Capillary 127 (H) 70 - 99 mg/dL    Comment: Glucose reference range applies only to samples taken after fasting for at least 8 hours.  CBC with Differential     Status: Abnormal   Collection Time: 03/08/23 10:33 AM  Result Value Ref Range   WBC 20.6 (H) 4.0 - 10.5 K/uL   RBC 3.12 (L) 4.22 -  5.81 MIL/uL   Hemoglobin 10.4 (L) 13.0 - 17.0 g/dL   HCT 16.1 (L) 09.6 - 04.5 %   MCV 103.2 (H) 80.0 - 100.0 fL   MCH 33.3 26.0 - 34.0 pg   MCHC 32.3 30.0 - 36.0 g/dL   RDW 40.9 81.1 - 91.4 %   Platelets 80 (L) 150 - 400 K/uL    Comment: Immature Platelet Fraction may be clinically indicated, consider ordering this additional test [id number] REPEATED TO VERIFY    nRBC 0.0 0.0 - 0.2 %   Neutrophils Relative % 87 %   Neutro Abs 17.8 (H) 1.7 - 7.7 K/uL   Lymphocytes Relative 5 %   Lymphs Abs 1.0 0.7 - 4.0 K/uL   Monocytes Relative 7 %   Monocytes Absolute 1.5 (H) 0.1 - 1.0 K/uL   Eosinophils Relative 0 %   Eosinophils Absolute 0.0 0.0 - 0.5 K/uL   Basophils Relative 0 %   Basophils Absolute 0.0 0.0 - 0.1 K/uL   Immature Granulocytes 1 %   Abs Immature Granulocytes 0.25 (H) 0.00 - 0.07 K/uL    Comment: Performed at [facility], [address] [place], [place] [address]  Resp panel by RT-PCR (RSV, Flu A&B, Covid) Anterior Nasal Swab     Status: None   Collection Time: 03/08/23 10:33 AM   Specimen: Anterior Nasal Swab  Result Value Ref Range   SARS Coronavirus 2 by RT PCR NEGATIVE NEGATIVE   Influenza A by PCR NEGATIVE NEGATIVE   Influenza B by PCR NEGATIVE NEGATIVE    Comment: (NOTE) The Xpert Xpress SARS-CoV-2/FLU/RSV plus assay is intended as an aid in the diagnosis of influenza from Nasopharyngeal swab specimens  and should not be used as a sole basis for treatment. Nasal washings and aspirates are unacceptable for Xpert Xpress SARS-CoV-2/FLU/RSV testing.  Fact Sheet for Patients: BloggerCourse.com  Fact Sheet for Healthcare Providers: SeriousBroker.it  This test is not yet approved or cleared by the [place] FDA and has been authorized for detection and/or diagnosis of SARS-CoV-2 by FDA under an Emergency Use Authorization (EUA). This EUA will remain in effect (meaning this test can be used) for the duration of the COVID-19 declaration under Section 564(b)(1) of the Act, 21 U.S.C. section 360bbb-3(b)(1), unless the authorization is terminated or revoked.     Resp Syncytial Virus by PCR NEGATIVE NEGATIVE    Comment: (NOTE) Fact Sheet for Patients: BloggerCourse.com  Fact Sheet for Healthcare Providers: SeriousBroker.it  This test is not yet approved or cleared by the [place] FDA and has been authorized for detection and/or diagnosis of SARS-CoV-2 by FDA under an Emergency Use Authorization (EUA). This EUA will remain in effect (meaning this test can be used) for the duration of the COVID-19 declaration under Section 564(b)(1) of the Act, 21 U.S.C. section 360bbb-3(b)(1), unless the authorization is terminated or revoked.  Performed at [facility], [address], [place], [place] [address]   Comprehensive metabolic panel     Status: Abnormal   Collection Time: 03/08/23 11:30 AM  Result Value Ref Range   Sodium 135 135 - 145 mmol/L   Potassium 3.4 (L) 3.5 - 5.1 mmol/L   Chloride 104 98 - 111 mmol/L   CO2 18 (L) 22 - 32 mmol/L   Glucose, Bld 119 (H) 70 - 99 mg/dL    Comment: Glucose reference range applies only to samples taken after fasting for at least 8 hours.   BUN 24 (H) 8 - 23 mg/dL  Creatinine, Ser 1.53 (H) 0.61 - 1.24 mg/dL   Calcium 9.1 8.9 - 09.8 mg/dL    Total Protein 6.9 6.5 - 8.1 g/dL   Albumin 3.2 (L) 3.5 - 5.0 g/dL   AST 18 15 - 41 U/L   ALT 10 0 - 44 U/L   Alkaline Phosphatase 72 38 - 126 U/L   Total Bilirubin 2.6 (H) <1.2 mg/dL   GFR, Estimated 42 (L) >60 mL/min    Comment: (NOTE) Calculated using the CKD-EPI Creatinine Equation (2021)    Anion gap 13 5 - 15    Comment: Performed at [facility], [address], [place], [place] [address]  Brain natriuretic peptide     Status: Abnormal   Collection Time: 03/08/23 11:30 AM  Result Value Ref Range   B Natriuretic Peptide 847.7 (H) 0.0 - 100.0 pg/mL    Comment: Performed at [facility], [address], [place], [place] [address]  Magnesium     Status: None   Collection Time: 03/08/23 11:30 AM  Result Value Ref Range   Magnesium 1.7 1.7 - 2.4 mg/dL    Comment: Performed at [facility], [address] [place], [place] [address]  Urinalysis, Complete w Microscopic -Urine, Catheterized; Indwelling urinary catheter     Status: Abnormal   Collection Time: 03/08/23 11:42 AM  Result Value Ref Range   Color, Urine AMBER (A) YELLOW    Comment: BIOCHEMICALS MAY BE AFFECTED BY COLOR   APPearance CLOUDY (A) CLEAR   Specific Gravity, Urine 1.015 1.005 - 1.030   pH 5.0 5.0 - 8.0   Glucose, UA NEGATIVE NEGATIVE mg/dL   Hgb urine dipstick MODERATE (A) NEGATIVE   Bilirubin Urine NEGATIVE NEGATIVE   Ketones, ur NEGATIVE NEGATIVE mg/dL   Protein, ur 865 (A) NEGATIVE mg/dL   Nitrite NEGATIVE NEGATIVE   Leukocytes,Ua LARGE (A) NEGATIVE   RBC / HPF 0-5 0 - 5 RBC/hpf   WBC, UA >50 0 - 5 WBC/hpf   Bacteria, UA MANY (A) NONE SEEN   Squamous Epithelial / HPF 0-5 0 - 5 /HPF   Mucus PRESENT    Ca Oxalate Crys, UA PRESENT     Comment: Performed at [facility], [address], [place], [place] [address]  I-stat chem 8, ED     Status: Abnormal   Collection Time: 03/08/23 11:52 AM  Result Value Ref Range   Sodium 136 135 - 145 mmol/L   Potassium 3.5 3.5 - 5.1 mmol/L    Chloride 104 98 - 111 mmol/L   BUN 27 (H) 8 - 23 mg/dL   Creatinine, Ser 6.29 (H) 0.61 - 1.24 mg/dL   Glucose, Bld 528 (H) 70 - 99 mg/dL    Comment: Glucose reference range applies only to samples taken after fasting for at least 8 hours.   Calcium, Ion 1.05 (L) 1.15 - 1.40 mmol/L   TCO2 19 (L) 22 - 32 mmol/L   Hemoglobin 10.5 (L) 13.0 - 17.0 g/dL   HCT 41.3 (L) 24.4 - 01.0 %  I-Stat Lactic Acid     Status: Abnormal   Collection Time: 03/08/23  1:13 PM  Result Value Ref Range   Lactic Acid, Venous 2.1 (HH) 0.5 - 1.9 mmol/L   Comment NOTIFIED PHYSICIAN   I-Stat Lactic Acid     Status: None   Collection Time: 03/08/23  4:07 PM  Result Value Ref Range   Lactic Acid, Venous 1.5 0.5 - 1.9 mmol/L   *Note:  Due to a large number of results and/or encounters for the requested time period, some results have not been displayed. A complete set of results can be found in Results Review.   CT Angio Chest PE W and/or Wo Contrast  Result Date: 03/08/2023 CLINICAL DATA:  Cough and low-grade fever. EXAM: CT ANGIOGRAPHY CHEST WITH CONTRAST TECHNIQUE: Multidetector CT imaging of the chest was performed using the standard protocol during bolus administration of intravenous contrast. Multiplanar CT image reconstructions and MIPs were obtained to evaluate the vascular anatomy. RADIATION DOSE REDUCTION: This exam was performed according to the departmental dose-optimization program which includes automated exposure control, adjustment of the mA and/or kV according to patient size and/or use of iterative reconstruction technique. CONTRAST:  75mL OMNIPAQUE IOHEXOL 350 MG/ML SOLN COMPARISON:  04/28/2022 FINDINGS: Cardiovascular: Pulmonary arteries are well opacified. There is chronic opacification of a segmental branch to the posterior basilar segment of the right lower lobe, unchanged from the prior CTA. No evidence of an acute pulmonary embolism. Stable changes from previous CABG surgery. Three-vessel coronary  artery calcifications. No pericardial effusion. Heart mildly enlarged. Dilated right pulmonary artery to 3.4 cm. Aorta is normal in caliber. Stable aortic atherosclerotic calcifications. Mediastinum/Nodes: No neck base, mediastinal or hilar masses. Mild mediastinal and right hilar adenopathy. Superior right paratracheal mediastinal lymph node measures 1.4 cm in short axis. Azygous level right paratracheal lymph node measures 1 cm short axis. Shotty subcarinal lymph node approximally 1.4 cm short axis. These nodes are stable from the prior CT. Mild right hilar adenopathy, nodes up to 1.4 cm in short axis, similar to the prior CT. Trachea and esophagus are unremarkable. Lungs/Pleura: Ground-glass and confluent consolidation throughout most of the right lower lobe. Milder ground-glass opacity noted in the peripheral right upper lobes and left lower lobe. Interstitial thickening noted bilaterally. Trace right pleural effusion.  No left pleural effusion. No pneumothorax. Upper Abdomen: No acute findings. Musculoskeletal: Moderate compression fracture of L1, chronic and stable from a chest CT dated 03/22/2022. No acute fractures. No bone lesions. No chest wall mass. Review of the MIP images confirms the above findings. IMPRESSION: 1. No evidence of an acute pulmonary embolism. Small chronic segmental pulmonary embolism to the posterior basilar segment of the right lower lobe stable from the prior CTA. 2. Right lower lobe consolidation consistent with pneumonia. 3. Other ground-glass lung opacities and interstitial thickening are similar to the prior CT. Consider underlying mild congestive heart failure with pulmonary edema as well as chronic interstitial lung disease in the differential. 4. Stable mild mediastinal right hilar lymphadenopathy, presumed reactive. 5. Stable mild cardiomegaly, stable changes from prior CABG surgery, coronary artery calcifications and aortic atherosclerosis. Aortic Atherosclerosis  (ICD10-I70.0). Electronically Signed   By: Amie Portland M.D.   On: 03/08/2023 16:02   DG Chest 2 View  Result Date: 03/08/2023 CLINICAL DATA:  Cough, hemoptysis, fever. EXAM: CHEST - 2 VIEW COMPARISON:  Radiographs 09/28/2022 and 09/27/2022. Chest CTA 04/28/2022. FINDINGS: 1059 hours. The heart size and mediastinal contours are stable status post median sternotomy and CABG. There is mild cardiomegaly and aortic atherosclerosis. Chronic central airway thickening with possible recurrent airspace disease at the right lung base. Trace bilateral pleural effusions are evident on the lateral view. No evidence of pneumothorax. Chronic L1 compression fracture appears unchanged from lumbar MRI 09/06/2019. No acute osseous findings are identified. Lucency laterally in the right upper quadrant the abdomen on the frontal examination corresponds with bowel on the lateral view. No definite pneumoperitoneum. IMPRESSION: 1. Possible recurrent airspace  disease at the right lung base, suspicious for pneumonia. Trace bilateral pleural effusions. 2. Chronic L1 compression fracture. Electronically Signed   By: Carey Bullocks M.D.   On: 03/08/2023 11:38    Pending Labs Unresulted Labs (From admission, onward)     Start     Ordered   03/09/23 0500  Comprehensive metabolic panel  Tomorrow morning,   R        03/08/23 1556   03/09/23 0500  CBC  Tomorrow morning,   R        03/08/23 1556   03/08/23 1045  Culture, blood (routine x 2)  BLOOD CULTURE X 2,   R (with STAT occurrences)      03/08/23 1045            Vitals/Pain Today's Vitals   03/08/23 1820 03/08/23 1854 03/08/23 1933 03/08/23 2013  BP: (!) 113/51  92/77   Pulse: (!) 112  79   Resp:   (!) 27   Temp:    98.5 F (36.9 C)  TempSrc:    Oral  SpO2:   92%   PainSc:  5       Isolation Precautions No active isolations  Medications Medications  doxycycline (VIBRA-TABS) tablet 100 mg (has no administration in time range)  diphenhydrAMINE (BENADRYL)  12.5 MG/5ML elixir 12.5 mg (has no administration in time range)  rosuvastatin (CRESTOR) tablet 5 mg (has no administration in time range)  FLUoxetine (PROZAC) capsule 20 mg (has no administration in time range)  pantoprazole (PROTONIX) EC tablet 40 mg (has no administration in time range)  finasteride (PROSCAR) tablet 5 mg (has no administration in time range)  tamsulosin (FLOMAX) capsule 0.4 mg (has no administration in time range)  apixaban (ELIQUIS) tablet 2.5 mg (has no administration in time range)  mometasone-formoterol (DULERA) 200-5 MCG/ACT inhaler 2 puff (has no administration in time range)  sodium chloride flush (NS) 0.9 % injection 3 mL (has no administration in time range)  acetaminophen (TYLENOL) tablet 650 mg (650 mg Oral Given 03/08/23 1701)    Or  acetaminophen (TYLENOL) suppository 650 mg ( Rectal See Alternative 03/08/23 1701)  polyethylene glycol (MIRALAX / GLYCOLAX) packet 17 g (has no administration in time range)  cefTRIAXone (ROCEPHIN) 1 g in sodium chloride 0.9 % 100 mL IVPB (has no administration in time range)  lactated ringers bolus 500 mL (0 mLs Intravenous Stopped 03/08/23 1252)  cefTRIAXone (ROCEPHIN) 1 g in sodium chloride 0.9 % 100 mL IVPB (0 g Intravenous Stopped 03/08/23 1507)  potassium chloride SA (KLOR-CON M) CR tablet 40 mEq (40 mEq Oral Given 03/08/23 1435)  doxycycline (VIBRA-TABS) tablet 100 mg (100 mg Oral Given 03/08/23 1435)  diphenhydrAMINE (BENADRYL) 12.5 MG/5ML elixir 12.5 mg (12.5 mg Oral Given 03/08/23 1435)  iohexol (OMNIPAQUE) 350 MG/ML injection 75 mL (75 mLs Intravenous Contrast Given 03/08/23 1435)  ipratropium (ATROVENT) nebulizer solution 0.5 mg (0.5 mg Nebulization Given 03/08/23 1828)  pantoprazole (PROTONIX) EC tablet 40 mg (40 mg Oral Given 03/08/23 1820)    Mobility Ambulates with walker with wheelchair followed behind by family      Focused Assessments     R Recommendations: See Admitting Provider Note  Report given to:    Additional Notes: Patient is hard of hearing so may need to speak loudly for patient to hear.

## 2023-03-09 ENCOUNTER — Ambulatory Visit: Payer: Medicare HMO | Admitting: Family Medicine

## 2023-03-09 ENCOUNTER — Encounter (HOSPITAL_COMMUNITY): Payer: Self-pay | Admitting: Internal Medicine

## 2023-03-09 DIAGNOSIS — D631 Anemia in chronic kidney disease: Secondary | ICD-10-CM | POA: Diagnosis present

## 2023-03-09 DIAGNOSIS — R918 Other nonspecific abnormal finding of lung field: Secondary | ICD-10-CM | POA: Diagnosis not present

## 2023-03-09 DIAGNOSIS — F32A Depression, unspecified: Secondary | ICD-10-CM | POA: Diagnosis present

## 2023-03-09 DIAGNOSIS — G4700 Insomnia, unspecified: Secondary | ICD-10-CM | POA: Diagnosis present

## 2023-03-09 DIAGNOSIS — Z978 Presence of other specified devices: Secondary | ICD-10-CM | POA: Diagnosis not present

## 2023-03-09 DIAGNOSIS — I10 Essential (primary) hypertension: Secondary | ICD-10-CM | POA: Diagnosis not present

## 2023-03-09 DIAGNOSIS — J14 Pneumonia due to Hemophilus influenzae: Secondary | ICD-10-CM | POA: Diagnosis present

## 2023-03-09 DIAGNOSIS — E785 Hyperlipidemia, unspecified: Secondary | ICD-10-CM | POA: Diagnosis present

## 2023-03-09 DIAGNOSIS — L89312 Pressure ulcer of right buttock, stage 2: Secondary | ICD-10-CM | POA: Diagnosis present

## 2023-03-09 DIAGNOSIS — Z1152 Encounter for screening for COVID-19: Secondary | ICD-10-CM | POA: Diagnosis not present

## 2023-03-09 DIAGNOSIS — D696 Thrombocytopenia, unspecified: Secondary | ICD-10-CM | POA: Diagnosis present

## 2023-03-09 DIAGNOSIS — I700 Atherosclerosis of aorta: Secondary | ICD-10-CM | POA: Diagnosis present

## 2023-03-09 DIAGNOSIS — N1832 Chronic kidney disease, stage 3b: Secondary | ICD-10-CM | POA: Diagnosis present

## 2023-03-09 DIAGNOSIS — N400 Enlarged prostate without lower urinary tract symptoms: Secondary | ICD-10-CM | POA: Diagnosis not present

## 2023-03-09 DIAGNOSIS — R0602 Shortness of breath: Secondary | ICD-10-CM | POA: Diagnosis present

## 2023-03-09 DIAGNOSIS — E876 Hypokalemia: Secondary | ICD-10-CM | POA: Diagnosis present

## 2023-03-09 DIAGNOSIS — I5022 Chronic systolic (congestive) heart failure: Secondary | ICD-10-CM | POA: Diagnosis present

## 2023-03-09 DIAGNOSIS — J90 Pleural effusion, not elsewhere classified: Secondary | ICD-10-CM | POA: Diagnosis not present

## 2023-03-09 DIAGNOSIS — J189 Pneumonia, unspecified organism: Secondary | ICD-10-CM

## 2023-03-09 DIAGNOSIS — F0394 Unspecified dementia, unspecified severity, with anxiety: Secondary | ICD-10-CM | POA: Diagnosis present

## 2023-03-09 DIAGNOSIS — J440 Chronic obstructive pulmonary disease with acute lower respiratory infection: Secondary | ICD-10-CM | POA: Diagnosis present

## 2023-03-09 DIAGNOSIS — A419 Sepsis, unspecified organism: Secondary | ICD-10-CM | POA: Diagnosis present

## 2023-03-09 DIAGNOSIS — I4821 Permanent atrial fibrillation: Secondary | ICD-10-CM | POA: Diagnosis present

## 2023-03-09 DIAGNOSIS — N179 Acute kidney failure, unspecified: Secondary | ICD-10-CM | POA: Diagnosis present

## 2023-03-09 DIAGNOSIS — E875 Hyperkalemia: Secondary | ICD-10-CM | POA: Diagnosis present

## 2023-03-09 DIAGNOSIS — L89322 Pressure ulcer of left buttock, stage 2: Secondary | ICD-10-CM | POA: Diagnosis present

## 2023-03-09 DIAGNOSIS — F0390 Unspecified dementia without behavioral disturbance: Secondary | ICD-10-CM | POA: Diagnosis not present

## 2023-03-09 DIAGNOSIS — I13 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 failure and stage 1 through stage 4 chronic kidney disease, or unspecified chronic kidney disease: Secondary | ICD-10-CM | POA: Diagnosis present

## 2023-03-09 DIAGNOSIS — J984 Other disorders of lung: Secondary | ICD-10-CM | POA: Diagnosis not present

## 2023-03-09 DIAGNOSIS — F411 Generalized anxiety disorder: Secondary | ICD-10-CM | POA: Diagnosis present

## 2023-03-09 DIAGNOSIS — I5020 Unspecified systolic (congestive) heart failure: Secondary | ICD-10-CM | POA: Diagnosis not present

## 2023-03-09 DIAGNOSIS — E8721 Acute metabolic acidosis: Secondary | ICD-10-CM | POA: Diagnosis present

## 2023-03-09 DIAGNOSIS — J9610 Chronic respiratory failure, unspecified whether with hypoxia or hypercapnia: Secondary | ICD-10-CM | POA: Diagnosis not present

## 2023-03-09 DIAGNOSIS — L89101 Pressure ulcer of unspecified part of back, stage 1: Secondary | ICD-10-CM | POA: Diagnosis present

## 2023-03-09 DIAGNOSIS — M069 Rheumatoid arthritis, unspecified: Secondary | ICD-10-CM | POA: Diagnosis present

## 2023-03-09 DIAGNOSIS — I517 Cardiomegaly: Secondary | ICD-10-CM | POA: Diagnosis not present

## 2023-03-09 DIAGNOSIS — A413 Sepsis due to Hemophilus influenzae: Secondary | ICD-10-CM | POA: Diagnosis not present

## 2023-03-09 LAB — BLOOD CULTURE ID PANEL (REFLEXED) - BCID2
A.calcoaceticus-baumannii: NOT DETECTED
Bacteroides fragilis: NOT DETECTED
Candida albicans: NOT DETECTED
Candida auris: NOT DETECTED
Candida glabrata: NOT DETECTED
Candida krusei: NOT DETECTED
Candida parapsilosis: NOT DETECTED
Candida tropicalis: NOT DETECTED
Cryptococcus neoformans/gattii: NOT DETECTED
Enterobacter cloacae complex: NOT DETECTED
Enterobacterales: NOT DETECTED
Enterococcus Faecium: NOT DETECTED
Enterococcus faecalis: NOT DETECTED
Escherichia coli: NOT DETECTED
Haemophilus influenzae: DETECTED — AB
Klebsiella aerogenes: NOT DETECTED
Klebsiella oxytoca: NOT DETECTED
Klebsiella pneumoniae: NOT DETECTED
Listeria monocytogenes: NOT DETECTED
Neisseria meningitidis: NOT DETECTED
Proteus species: NOT DETECTED
Pseudomonas aeruginosa: NOT DETECTED
Salmonella species: NOT DETECTED
Serratia marcescens: NOT DETECTED
Staphylococcus aureus (BCID): NOT DETECTED
Staphylococcus epidermidis: NOT DETECTED
Staphylococcus lugdunensis: NOT DETECTED
Staphylococcus species: NOT DETECTED
Stenotrophomonas maltophilia: NOT DETECTED
Streptococcus agalactiae: NOT DETECTED
Streptococcus pneumoniae: NOT DETECTED
Streptococcus pyogenes: NOT DETECTED
Streptococcus species: NOT DETECTED

## 2023-03-09 LAB — COMPREHENSIVE METABOLIC PANEL
ALT: 11 U/L (ref 0–44)
AST: 19 U/L (ref 15–41)
Albumin: 2.6 g/dL — ABNORMAL LOW (ref 3.5–5.0)
Alkaline Phosphatase: 64 U/L (ref 38–126)
Anion gap: 10 (ref 5–15)
BUN: 30 mg/dL — ABNORMAL HIGH (ref 8–23)
CO2: 20 mmol/L — ABNORMAL LOW (ref 22–32)
Calcium: 8.6 mg/dL — ABNORMAL LOW (ref 8.9–10.3)
Chloride: 105 mmol/L (ref 98–111)
Creatinine, Ser: 1.5 mg/dL — ABNORMAL HIGH (ref 0.61–1.24)
GFR, Estimated: 43 mL/min — ABNORMAL LOW (ref >60)
Glucose, Bld: 111 mg/dL — ABNORMAL HIGH (ref 70–99)
Potassium: 3.4 mmol/L — ABNORMAL LOW (ref 3.5–5.1)
Sodium: 135 mmol/L (ref 135–145)
Total Bilirubin: 1.5 mg/dL — ABNORMAL HIGH (ref <1.2)
Total Protein: 5.8 g/dL — ABNORMAL LOW (ref 6.5–8.1)

## 2023-03-09 LAB — CBC
HCT: 27.2 % — ABNORMAL LOW (ref 39.0–52.0)
Hemoglobin: 9.2 g/dL — ABNORMAL LOW (ref 13.0–17.0)
MCH: 33.8 pg (ref 26.0–34.0)
MCHC: 33.8 g/dL (ref 30.0–36.0)
MCV: 100 fL (ref 80.0–100.0)
Platelets: 67 10*3/uL — ABNORMAL LOW (ref 150–400)
RBC: 2.72 MIL/uL — ABNORMAL LOW (ref 4.22–5.81)
RDW: 15 % (ref 11.5–15.5)
WBC: 12.6 10*3/uL — ABNORMAL HIGH (ref 4.0–10.5)
nRBC: 0 % (ref 0.0–0.2)

## 2023-03-09 MED ORDER — CHLORHEXIDINE GLUCONATE CLOTH 2 % EX PADS
6.0000 | MEDICATED_PAD | Freq: Every day | CUTANEOUS | Status: DC
  Administered 2023-03-09 – 2023-03-13 (×5): 6 via TOPICAL

## 2023-03-09 NOTE — Evaluation (Signed)
Physical Therapy Evaluation Patient Details Name: Evan [name] MRN: [id number] DOB: 29-Jun-1927 Today's Date: 03/09/2023  History of Present Illness  89 y.o. male admitted 12/4 with Pneumonia. PMH: CAD sp CABG, systolic  CHF EF 57-84% BPH, esophageal ulcer, GERD, HLD, HTN RA, hx of subdural hematoma in the past, HOH, thrombocytopenia  Clinical Impression  Pt admitted with above diagnosis. Great family support at home. They assist with all ADLs and guard patient while ambulating household distances with an upright walker. Log roll technique to reduce friction on buttocks, which he complains is quite painful. (RN notified, suggest considering air mattress during admission.) Pt stood with mod assist, AFO on RLE, RW for support. Fatigued quickly but able to take lateral steps with min assist along bed. HR to 120s, returns to 80s with rest. Family reports cognition greatly improved from yesterday but not quite baseline. Pt currently with functional limitations due to the deficits listed below (see PT Problem List). Pt will benefit from acute skilled PT to increase their independence and safety with mobility to allow discharge.           If plan is discharge home, recommend the following: A lot of help with walking and/or transfers;A little help with bathing/dressing/bathroom;Assistance with cooking/housework;Direct supervision/assist for medications management;Direct supervision/assist for financial management;Assist for transportation;Help with stairs or ramp for entrance;Supervision due to cognitive status   Can travel by private vehicle        Equipment Recommendations None recommended by PT  Recommendations for Other Services       Functional Status Assessment Patient has had a recent decline in their functional status and demonstrates the ability to make significant improvements in function in a reasonable and predictable amount of time.     Precautions / Restrictions  Precautions Precautions: Fall Required Braces or Orthoses: Other Brace Other Brace: Rt AFO Restrictions Weight Bearing Restrictions: No      Mobility  Bed Mobility Overal bed mobility: Needs Assistance Bed Mobility: Rolling, Sidelying to Sit, Sit to Sidelying Rolling: Min assist Sidelying to sit: Min assist     Sit to sidelying: Min assist General bed mobility comments: Min assist with roll and rise to facilitate trunk and LEs out of bed. Log roll used to reduce friction on buttocks. Min assist for LEs into bed. Cues throughout.    Transfers Overall transfer level: Needs assistance Equipment used: Rolling walker (2 wheels) Transfers: Sit to/from Stand Sit to Stand: Mod assist           General transfer comment: Mod assist for boost to stand, posterior lean. Cues for anterior weight shift. Rt AFO was donned. Able to take lateral steps along bed with min assist for RW control and balance. Easily fatigued. HR to 120s.    Ambulation/Gait                  Stairs            Wheelchair Mobility     Tilt Bed    Modified Rankin (Stroke Patients Only)       Balance Overall balance assessment: Needs assistance Sitting-balance support: No upper extremity supported, Feet supported Sitting balance-[name] Scale: Fair     Standing balance support: Bilateral upper extremity supported, Reliant on assistive device for balance Standing balance-[name] Scale: Poor Standing balance comment: Min assist for balance at all times. posterior lean  Pertinent Vitals/Pain Pain Assessment Pain Assessment: Faces Faces Pain Scale: Hurts even more Pain Location: buttocks Pain Descriptors / Indicators: Aching Pain Intervention(s): Monitored during session, Repositioned, Other (comment) (Requested RN order air mattress)    Home Living Family/patient expects to be discharged to:: Private residence Living Arrangements:  Children Available Help at Discharge: Family;Available 24 hours/day Type of Home: House Home Access: Ramped entrance       Home Layout: Two level;Able to live on main level with bedroom/bathroom Home Equipment: Shower seat;Grab bars - tub/shower;Hospital bed;Wheelchair - manual;Hand held shower head;Other (comment) (Upright walker) Additional Comments: Uses upright walker at baseline    Prior Function Prior Level of Function : Needs assist             Mobility Comments: Family provides physical assist with all gait, uses upright walker in the house. ADLs Comments: Family assists with all ADLs     Extremity/Trunk Assessment   Upper Extremity Assessment Upper Extremity Assessment: Defer to OT evaluation    Lower Extremity Assessment Lower Extremity Assessment: Generalized weakness (RLE spasticity)       Communication   Communication Communication: Hearing impairment Cueing Techniques: Verbal cues;Tactile cues  Cognition Arousal: Alert Behavior During Therapy: WFL for tasks assessed/performed Overall Cognitive Status: Impaired/Different from baseline Area of Impairment: Following commands, Problem solving                       Following Commands: Follows one step commands consistently, Follows one step commands with increased time     Problem Solving: Slow processing, Difficulty sequencing, Requires verbal cues General Comments: Some due to [facility], but daughter reports not back to baseline. She states he is much better than yesterday.        General Comments General comments (skin integrity, edema, etc.): Sacral prophylactic dressing in place but complains of pain. RN notified. Assisted into sidelying with 1/4 roll and pillow. Dtr educated on encouraging pt to roll off of buttock and sides routinely, every 30 min if possible but avoid 2hr in single position at a time.    Exercises General Exercises - Lower Extremity Ankle Circles/Pumps: AROM, Both, 10 reps,  Supine Quad Sets: Strengthening, Both, 10 reps, Supine Gluteal Sets: Strengthening, Both, 10 reps, Supine   Assessment/Plan    PT Assessment Patient needs continued PT services  PT Problem List Decreased strength;Decreased range of motion;Decreased activity tolerance;Decreased balance;Decreased mobility;Decreased coordination;Decreased cognition;Decreased knowledge of use of DME;Decreased knowledge of precautions;Decreased safety awareness;Cardiopulmonary status limiting activity;Impaired tone;Pain;Decreased skin integrity       PT Treatment Interventions DME instruction;Gait training;Functional mobility training;Therapeutic activities;Therapeutic exercise;Balance training;Neuromuscular re-education;Cognitive remediation;Patient/family education    PT Goals (Current goals can be found in the Care Plan section)  Acute Rehab PT Goals Patient Stated Goal: Feel better, reduce buttock pain PT Goal Formulation: With patient/family Time For Goal Achievement: 03/23/23 Potential to Achieve Goals: Good    Frequency Min 1X/week     Co-evaluation               AM-PAC PT "6 Clicks" Mobility  Outcome Measure Help needed turning from your back to your side while in a flat bed without using bedrails?: A Little Help needed moving from lying on your back to sitting on the side of a flat bed without using bedrails?: A Little Help needed moving to and from a bed to a chair (including a wheelchair)?: A Lot Help needed standing up from a chair using your arms (e.g., wheelchair or bedside chair)?: A  Lot Help needed to walk in hospital room?: A Lot Help needed climbing 3-5 steps with a railing? : Total 6 Click Score: 13    End of Session   Activity Tolerance: Patient limited by fatigue Patient left: in bed;with call bell/phone within reach;with bed alarm set;with nursing/sitter in room Nurse Communication: Mobility status;Other (comment) (air mattress recommended) PT Visit Diagnosis:  Unsteadiness on feet (R26.81);Muscle weakness (generalized) (M62.81);Difficulty in walking, not elsewhere classified (R26.2);Other symptoms and signs involving the nervous system (R29.898);Pain Pain - part of body:  (buttock)    Time: 1610-9604 PT Time Calculation (min) (ACUTE ONLY): 27 min   Charges:   PT Evaluation $PT Eval Low Complexity: 1 Low PT Treatments $Therapeutic Activity: 8-22 mins PT General Charges $$ ACUTE PT VISIT: 1 Visit         Kathlyn Sacramento, PT, DPT [facility]  Rehabilitation Services Physical Therapist Office: [phone] Website: [email or web address]   Berton Mount 03/09/2023, 4:36 PM

## 2023-03-09 NOTE — Evaluation (Signed)
Clinical/Bedside Swallow Evaluation Patient Details  Name: Evan [name] MRN: [id number] Date of Birth: [birth year]  Today's Date: 03/09/2023 Time: SLP Start Time (ACUTE ONLY): 1310 SLP Stop Time (ACUTE ONLY): 1327 SLP Time Calculation (min) (ACUTE ONLY): 17 min  Past Medical History:  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   Altered mental status 08/21/2018   Anginal chest pain at rest [facility])    Chronic non, controlled on Lorazepam   Anxiety    Arthritis    "all over"    BPH (benign prostatic hyperplasia)    CAD (coronary artery disease)    Cellulitis of arm, right 06/01/2018   Cellulitis of left axilla    Chronic lower back pain    Complication of anesthesia    "problems making water afterwards"   Depression    DJD (degenerative joint disease)    Esophageal ulcer without bleeding    bid ppi indefinitely   Fall 07/16/2018   Family history of adverse reaction to anesthesia    "children w/PONV"   GERD (gastroesophageal reflux disease)    Headache    "couple/week maybe" (09/04/2015)   Hemochromatosis    Possible, elevated iron stores, hook-like osteophytes on hand films, normal LFTs   Hepatitis    "yellow jaundice as a baby"   History of ASCVD    MULTIVESSEL   Hyperlipidemia    Hypertension    Hypotension    Mild aortic stenosis 06/23/2021   Osteoarthritis    Pneumonia    "several times; got a little now" (09/04/2015)   Rash and nonspecific skin eruption 06/01/2018   Rheumatoid arthritis (HCC)    RSV (respiratory syncytial virus pneumonia) 05/15/2022   Subdural hematoma (HCC)    small after fall 09/2016-plavix held, neurosurgery consulted.  Asymptomatic.     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 he failed PO Eliquis   Past Surgical  History:  Past Surgical History:  Procedure Laterality Date   ARM SKIN LESION BIOPSY / EXCISION Left 09/02/2015   CATARACT EXTRACTION W/ INTRAOCULAR LENS  IMPLANT, BILATERAL Bilateral    CHOLECYSTECTOMY OPEN     CORNEAL TRANSPLANT Bilateral    "one at [facility]; one at [facility]"   CORONARY ANGIOPLASTY WITH STENT PLACEMENT     CORONARY ARTERY BYPASS GRAFT  1999   DESCENDING AORTIC ANEURYSM REPAIR W/ STENT     DE stent ostium into the right radial free graft at [facility]-, 02-2007   ESOPHAGOGASTRODUODENOSCOPY N/A 11/09/2014   Procedure: ESOPHAGOGASTRODUODENOSCOPY (EGD);  Surgeon: Dorena Cookey, MD;  Location: [facility] ENDOSCOPY;  Service: Endoscopy;  Laterality: N/A;   INGUINAL HERNIA REPAIR     JOINT REPLACEMENT     LEFT HEART CATH AND CORS/GRAFTS ANGIOGRAPHY N/A 06/27/2017   Procedure: LEFT HEART CATH AND CORS/GRAFTS ANGIOGRAPHY;  Surgeon: Lennette Bihari, MD;  Location: [facility] INVASIVE CV LAB;  Service: Cardiovascular;  Laterality: N/A;   NASAL SINUS SURGERY     SHOULDER OPEN ROTATOR CUFF REPAIR Right    TOTAL KNEE ARTHROPLASTY Bilateral    HPI:  89 y.o. male presenting with shortness of breath and altered mental status. He was exposed to 2 people who were found later to have pneumonia at Thanksgiving.  Now, for the past 2 days patient has had a low-grade fever around 99.5 and has been reporting some shortness  of breath as well as orthopnea and oral intake.  He has been coughing when drinking at home and at the hospital.  CT shows right lower lobe consolidation, groundglass opacities and interstitial thickening similar to prior. Prior MBS in 2023 shows sensed aspriation of thin liquids, improved with small sips. Daughter reports pt eats and drinks too fast at home.  Pt with medical history significant of hypertension, hyperlipidemia, GERD, CKD 3, BPH, anemia, atrial fibrillation, subdural hematoma, CVA, CAD status post CABG, QT prolongation, dementia, sundowning, hearing loss, anxiety, depression, chronic respiratory failure  with hypoxia, COPD, lung cancer, radiation pneumonitis, rheumatoid arthritis, Per family he is hard of hearing.    Assessment / Plan / Recommendation  Clinical Impression  Pt demosntrates immediate and consistent coughing with sips of thin liquids. SLP trialed small single sips as well as a chin tuck. Pt continues to cough. Trialed nectar thick liquids. Pt consumed 4 oz of water without coughing and did not seem to notice a difference. Discussed plan of care briefly with daughter. Offered several options, but she was in favor of instrumental testing and open to use of thickened liquids to decrease aspiration. Will proceed with MBS tomorrow to aid in decision making. SLP Visit Diagnosis: Dysphagia, oropharyngeal phase (R13.12)    Aspiration Risk  Severe aspiration risk    Diet Recommendation Regular;Nectar-thick liquid    Medication Administration: Whole meds with puree Supervision: Staff to assist with self feeding Compensations: Slow rate;Small sips/bites Postural Changes: Seated upright at 90 degrees    Other  Recommendations      Recommendations for follow up therapy are one component of a multi-disciplinary discharge planning process, led by the attending physician.  Recommendations may be updated based on patient status, additional functional criteria and insurance authorization.  Follow up Recommendations        Assistance Recommended at Discharge    Functional Status Assessment    Frequency and Duration            Prognosis Prognosis for improved oropharyngeal function: Guarded Barriers to Reach Goals: Severity of deficits;Cognitive deficits      Swallow Study   General HPI: 89 y.o. male presenting with shortness of breath and altered mental status. He was exposed to 2 people who were found later to have pneumonia at Thanksgiving.  Now, for the past 2 days patient has had a low-grade fever around 99.5 and has been reporting some shortness of breath as well as orthopnea  and oral intake.  He has been coughing when drinking at home and at the hospital.  CT shows right lower lobe consolidation, groundglass opacities and interstitial thickening similar to prior. Prior MBS in 2023 shows sensed aspriation of thin liquids, improved with small sips. Daughter reports pt eats and drinks too fast at home.  Pt with medical history significant of hypertension, hyperlipidemia, GERD, CKD 3, BPH, anemia, atrial fibrillation, subdural hematoma, CVA, CAD status post CABG, QT prolongation, dementia, sundowning, hearing loss, anxiety, depression, chronic respiratory failure with hypoxia, COPD, lung cancer, radiation pneumonitis, rheumatoid arthritis, Per family he is hard of hearing. Type of Study: Bedside Swallow Evaluation Previous Swallow Assessment: regular/thin Diet Prior to this Study: Regular;Thin liquids (Level 0) Temperature Spikes Noted: No Respiratory Status: Room air History of Recent Intubation: No Behavior/Cognition: Alert;Cooperative;Pleasant mood Oral Cavity Assessment: Within Functional Limits Oral Care Completed by SLP: No Oral Cavity - Dentition: Adequate natural dentition Vision: Functional for self-feeding Self-Feeding Abilities: Able to feed self Patient Positioning: Upright in bed Baseline Vocal Quality: Normal  Volitional Cough: Strong Volitional Swallow: Able to elicit    Oral/Motor/Sensory Function Overall Oral Motor/Sensory Function: Within functional limits   Ice Chips     Thin Liquid Thin Liquid: Impaired Pharyngeal  Phase Impairments: Cough - Immediate    Nectar Thick Nectar Thick Liquid: Within functional limits Presentation: Straw   Honey Thick Honey Thick Liquid: Not tested   Puree Puree: Within functional limits Presentation: Spoon   Solid     Solid: Not tested      Claudine Mouton 03/09/2023,2:25 PM

## 2023-03-09 NOTE — Progress Notes (Signed)
PROGRESS NOTE Evan [name]  JJO:[id number] DOB: 1927/11/13 DOA: 03/08/2023 PCP: Donita Brooks, MD  Brief Narrative/Hospital Course: 89 y.o. male with medical history significant of hypertension, hyperlipidemia, GERD, CKD 3, BPH, anemia, atrial fibrillation, subdural hematoma, CVA, CAD status post CABG, QT prolongation, dementia, sundowning, hearing loss, anxiety, depression, chronic respiratory failure with hypoxia, COPD, lung cancer, radiation pneumonitis, rheumatoid arthritis, PE presenting with shortness of breath and altered mental status.  Per family he is hard of hearing, baseline dementia and was exposed to 2 people who were found later to have pneumonia at Thanksgiving.  Now, for the past 2 days patient has had a low-grade fever around 99.5 and has been reporting some shortness of breath as well as orthopnea and oral intake.  He has baseline tremors and has been worse past 2 days. In ED  BP- 110s to 130s systolic, respiratory rate in the 20s to 30s.  Labs>> mild hypokalemia creatinine stable at 1.53, albumin 3.2, T. bili 2.6. Leukocytosis 20.6, hemoglobin stable at 10.4, platelets stable at 80.  BNP elevated to 847.  Lactic acid borderline at 2.1 and repeat  1.5 RVP panel for flu COVID and RSV negative. Blood cultures sent CXR>>possible recurrent airspace disease suspicious for pneumonia and chronic L1 compression fracture noted. CTA PE>> no acute PE, small chronic segmental PE stable from prior CTA, right lower lobe consolidation, groundglass opacities and interstitial thickening similar to prior CT Patient is admitted for further management    Subjective: Seen and examined Daughter at the bedside Patient is alert awake oriented to self place with baseline dementia has short-term memory issues mentation much better today Overnight patient hypotensive improved with IV fluid bolus Also febrile 101 around 4 PM yesterday afebrile since, on room air Labs shows mild hypokalemia 3.4  creatinine about the same 1.5 WBC down to 12.6 hemoglobin 9.2 platelets 67K   Assessment and Plan: Principal Problem:   PNA (pneumonia) Active Problems:   Chronic respiratory failure, unsp w hypoxia or hypercapnia (HCC)   History of pulmonary embolism   COPD (chronic obstructive pulmonary disease) (HCC)   CKD (chronic kidney disease), stage III (HCC)   Essential hypertension   Benign prostatic hyperplasia without lower urinary tract symptoms   Hyperlipidemia, unspecified   S/P CABG (coronary artery bypass graft)   Pulmonary embolism (HCC)   Permanent atrial fibrillation (HCC)   Gastro-esophageal reflux disease without esophagitis   Asthma, chronic   CAD (coronary artery disease)   GAD (generalized anxiety disorder)   Rheumatoid arthritis, unspecified (HCC)   Radiation pneumonitis (HCC)   Personal history of other venous thrombosis and embolism   Thalamic hemorrhage (HCC)   History of lung cancer   Depression   Hemiparesis due to old stroke (HCC)   Presence of coronary angioplasty implant and graft   Dementia without behavioral disturbance (HCC)   Anemia   Pneumonia Chronic respiratory failure? Has background history of chronic respiratory failure?(Not on oxygen), history of lung cancer, COPD, radiation pneumonitis,  Chronic PE noticed but unchanged now with pneumonia.  Afebrile overnight WBC count downtrending, continue antibiotic coverage, await speech eval Follow-up culture data.  Continue I-S, aspiration precaution supportive care Recent Labs  Lab 03/08/23 1033 03/08/23 1313 03/08/23 1607 03/09/23 0659  WBC 20.6*  --   --  12.6*  LATICACIDVEN  --  2.1* 1.5  --     History of lung cancer status post radiation History of radiation pneumonitis  COPD versus asthma History of obstructive lung disease in the chart, cont  home Symbicort    Hypertension BP stable.  Hypotensive 12/4 Lasix metoprolol held for now   Hyperlipidemia On rosuvastatin   GERD Cont ppi    CKD 3b Mild metabolic acidosis form ckd: B/l creat ~ 1.3.15 -holding stable monitor closely Hypokalemia: Replaced Recent Labs    05/16/22 0146 05/17/22 0106 06/28/22 0935 09/27/22 1731 09/28/22 0230 09/29/22 0012 10/14/22 0930 11/05/22 1312 03/08/23 1130 03/08/23 1152 03/09/23 0659  BUN 34* 47* 27* 29* 32* 35* 24 28* 24* 27* 30*  CREATININE 1.47* 1.55* 1.17 1.29* 1.55* 1.30* 1.51* 1.28* 1.53* 1.50* 1.50*  CO2 21* 19* 27 22 20* 21* 18* 24 18*  --  20*  K 3.9 3.5 3.8 3.7 3.3* 4.0 4.0 3.9 3.4* 3.5 3.4*    BPH With chronic Foley in place since March 2023 Continue Foley had failed voiding trial before continue Flomax finasteride   Chronic anemia Hb stable.  Continue to trend   Paroxysmal atrial fibrillation: Likely paroxysmal, currently in sinus rhythm continue Eliquis holding metoprolol   History of CVA  Continue home rosuvastatin, Eliquis   CAD w/p CABG:  Cont rosuvastatin, Eliquis   QTc prolongation Monitor   Anxiety Depression Dementia w/ short-term memory issue Per chart has had some history of sundowning in the past.  Does have difficulty hearing which could contribute. Currently mentation much better and improving provide supportive care delirium precaution   History of DVT/PE Continue home Eliquis  Goals of care: Discussed POC with patient and his daughter currently staying full code refusing palliative care evaluation at this time  DVT prophylaxis: apixaban (ELIQUIS) tablet 2.5 mg Start: 03/08/23 2200 Code Status:   Code Status: Full Code Family Communication: plan of care discussed with patient and his daughter  at bedside. Patient status is:  admitted as observation but remains hospitalized for ongoing  because of pneumonia Level of care: Telemetry Medical   Dispo: The patient is from: home            Anticipated disposition: TBD Objective: Vitals last 24 hrs: Vitals:   03/08/23 2154 03/09/23 0500 03/09/23 0545 03/09/23 0744  BP: 106/75   107/78 (!) 141/60  Pulse: 72  72 60  Resp: 18  18 16   Temp: 98 F (36.7 C)  (!) 97.5 F (36.4 C) 97.7 F (36.5 C)  TempSrc: Oral  Oral   SpO2: 100%  95% 96%  Weight:  65.1 kg     Weight change:   Physical Examination: General exam: alert awake, elderly frail ill-appearing older than stated age HEENT:Oral mucosa moist, Ear/Nose WNL grossly Respiratory system: bilaterally diminished BS, no use of accessory muscle Cardiovascular system: S1 & S2 +, No JVD. Gastrointestinal system: Abdomen soft,NT,ND, BS+ Nervous System:Alert, awake, moving extremities. Extremities: LE edema neg,distal peripheral pulses palpable.  Skin: No rashes,no icterus. MSK: Normal muscle bulk,tone, power  Medications reviewed:  Scheduled Meds:  apixaban  2.5 mg Oral BID   doxycycline  100 mg Oral Q12H   finasteride  5 mg Oral Daily   FLUoxetine  20 mg Oral Daily   mometasone-formoterol  2 puff Inhalation BID   pantoprazole  40 mg Oral q morning   rosuvastatin  5 mg Oral QHS   sodium chloride flush  3 mL Intravenous Q12H   tamsulosin  0.4 mg Oral QHS   Continuous Infusions:  cefTRIAXone (ROCEPHIN)  IV     Diet Order             Diet Heart Room service appropriate? Yes; Fluid consistency: Thin  Diet effective now                   Intake/Output Summary (Last 24 hours) at 03/09/2023 1030 Last data filed at 03/08/2023 2300 Gross per 24 hour  Intake 120 ml  Output --  Net 120 ml   Net IO Since Admission: 120 mL [03/09/23 1030]  Wt Readings from Last 3 Encounters:  03/09/23 65.1 kg  12/23/22 65.8 kg  11/05/22 66.2 kg     Unresulted Labs (From admission, onward)    None     Data Reviewed: I have personally reviewed following labs and imaging studies CBC: Recent Labs  Lab 03/08/23 1033 03/08/23 1152 03/09/23 0659  WBC 20.6*  --  12.6*  NEUTROABS 17.8*  --   --   HGB 10.4* 10.5* 9.2*  HCT 32.2* 31.0* 27.2*  MCV 103.2*  --  100.0  PLT 80*  --  67*   Basic Metabolic Panel:   Recent Labs  Lab 03/08/23 1130 03/08/23 1152 03/09/23 0659  NA 135 136 135  K 3.4* 3.5 3.4*  CL 104 104 105  CO2 18*  --  20*  GLUCOSE 119* 116* 111*  BUN 24* 27* 30*  CREATININE 1.53* 1.50* 1.50*  CALCIUM 9.1  --  8.6*  MG 1.7  --   --    GFR: Estimated Creatinine Clearance: 24.8 mL/min (A) (by C-G formula based on SCr of 1.5 mg/dL (H)). Liver Function Tests:  Recent Labs  Lab 03/08/23 1130 03/09/23 0659  AST 18 19  ALT 10 11  ALKPHOS 72 64  BILITOT 2.6* 1.5*  PROT 6.9 5.8*  ALBUMIN 3.2* 2.6*   Recent Labs  Lab 03/08/23 1030  GLUCAP 127*  sepsis Labs: Recent Labs  Lab 03/08/23 1313 03/08/23 1607  LATICACIDVEN 2.1* 1.5    Recent Results (from the past 240 hour(s))  Resp panel by RT-PCR (RSV, Flu A&B, Covid) Anterior Nasal Swab     Status: None   Collection Time: 03/08/23 10:33 AM   Specimen: Anterior Nasal Swab  Result Value Ref Range Status   SARS Coronavirus 2 by RT PCR NEGATIVE NEGATIVE Final   Influenza A by PCR NEGATIVE NEGATIVE Final   Influenza B by PCR NEGATIVE NEGATIVE Final    Comment: (NOTE) The Xpert Xpress SARS-CoV-2/FLU/RSV plus assay is intended as an aid in the diagnosis of influenza from Nasopharyngeal swab specimens and should not be used as a sole basis for treatment. Nasal washings and aspirates are unacceptable for Xpert Xpress SARS-CoV-2/FLU/RSV testing.  Fact Sheet for Patients: BloggerCourse.com  Fact Sheet for Healthcare Providers: SeriousBroker.it  This test is not yet approved or cleared by the [place] FDA and has been authorized for detection and/or diagnosis of SARS-CoV-2 by FDA under an Emergency Use Authorization (EUA). This EUA will remain in effect (meaning this test can be used) for the duration of the COVID-19 declaration under Section 564(b)(1) of the Act, 21 U.S.C. section 360bbb-3(b)(1), unless the authorization is terminated or revoked.     Resp  Syncytial Virus by PCR NEGATIVE NEGATIVE Final    Comment: (NOTE) Fact Sheet for Patients: BloggerCourse.com  Fact Sheet for Healthcare Providers: SeriousBroker.it  This test is not yet approved or cleared by the [place] FDA and has been authorized for detection and/or diagnosis of SARS-CoV-2 by FDA under an Emergency Use Authorization (EUA). This EUA will remain in effect (meaning this test can be used) for the duration of the COVID-19 declaration under Section 564(b)(1) of the Act,  21 U.S.C. section 360bbb-3(b)(1), unless the authorization is terminated or revoked.  Performed at [facility], [address], [place], [place] [address]   Culture, blood (routine x 2)     Status: None (Preliminary result)   Collection Time: 03/08/23 10:45 AM   Specimen: BLOOD RIGHT FOREARM  Result Value Ref Range Status   Specimen Description BLOOD RIGHT FOREARM  Final   Special Requests   Final    BOTTLES DRAWN AEROBIC AND ANAEROBIC Blood Culture results may not be optimal due to an inadequate volume of blood received in culture bottles   Culture   Final    NO GROWTH < 24 HOURS Performed at [facility], [address] [place], [place] [address]    Report Status PENDING  Incomplete  Culture, blood (routine x 2)     Status: None (Preliminary result)   Collection Time: 03/08/23 10:50 AM   Specimen: BLOOD  Result Value Ref Range Status   Specimen Description BLOOD RIGHT ANTECUBITAL  Final   Special Requests   Final    BOTTLES DRAWN AEROBIC AND ANAEROBIC Blood Culture results may not be optimal due to an inadequate volume of blood received in culture bottles   Culture   Final    NO GROWTH < 24 HOURS Performed at [facility], [address] [place], [place] [address]    Report Status PENDING  Incomplete    Antimicrobials: Anti-infectives (From admission, onward)    Start     Dose/Rate Route Frequency Ordered Stop    03/09/23 1000  cefTRIAXone (ROCEPHIN) 1 g in sodium chloride 0.9 % 100 mL IVPB  Status:  Discontinued        1 g 200 mL/hr over 30 Minutes Intravenous Every 24 hours 03/08/23 1556 03/08/23 1652   03/09/23 1000  cefTRIAXone (ROCEPHIN) 1 g in sodium chloride 0.9 % 100 mL IVPB        1 g 200 mL/hr over 30 Minutes Intravenous Every 24 hours 03/08/23 1652     03/08/23 2200  doxycycline (VIBRA-TABS) tablet 100 mg        100 mg Oral Every 12 hours 03/08/23 1556     03/08/23 1400  cefTRIAXone (ROCEPHIN) 1 g in sodium chloride 0.9 % 100 mL IVPB        1 g 200 mL/hr over 30 Minutes Intravenous  Once 03/08/23 1346 03/08/23 1507   03/08/23 1400  doxycycline (VIBRA-TABS) tablet 100 mg        100 mg Oral  Once 03/08/23 1346 03/08/23 1435      Culture/Microbiology    Component Value Date/Time   SDES BLOOD RIGHT ANTECUBITAL 03/08/2023 1050   SPECREQUEST  03/08/2023 1050    BOTTLES DRAWN AEROBIC AND ANAEROBIC Blood Culture results may not be optimal due to an inadequate volume of blood received in culture bottles   CULT  03/08/2023 1050    NO GROWTH < 24 HOURS Performed at [facility], [address], [place], [place] [address]    REPTSTATUS PENDING 03/08/2023 1050    Radiology Studies: CT Angio Chest PE W and/or Wo Contrast  Result Date: 03/08/2023 CLINICAL DATA:  Cough and low-grade fever. EXAM: CT ANGIOGRAPHY CHEST WITH CONTRAST TECHNIQUE: Multidetector CT imaging of the chest was performed using the standard protocol during bolus administration of intravenous contrast. Multiplanar CT image reconstructions and MIPs were obtained to evaluate the vascular anatomy. RADIATION DOSE REDUCTION: This exam was performed according to the departmental dose-optimization program  which includes automated exposure control, adjustment of the mA and/or kV according to patient size and/or use of iterative reconstruction technique. CONTRAST:  75mL OMNIPAQUE IOHEXOL 350 MG/ML SOLN COMPARISON:  04/28/2022  FINDINGS: Cardiovascular: Pulmonary arteries are well opacified. There is chronic opacification of a segmental branch to the posterior basilar segment of the right lower lobe, unchanged from the prior CTA. No evidence of an acute pulmonary embolism. Stable changes from previous CABG surgery. Three-vessel coronary artery calcifications. No pericardial effusion. Heart mildly enlarged. Dilated right pulmonary artery to 3.4 cm. Aorta is normal in caliber. Stable aortic atherosclerotic calcifications. Mediastinum/Nodes: No neck base, mediastinal or hilar masses. Mild mediastinal and right hilar adenopathy. Superior right paratracheal mediastinal lymph node measures 1.4 cm in short axis. Azygous level right paratracheal lymph node measures 1 cm short axis. Shotty subcarinal lymph node approximally 1.4 cm short axis. These nodes are stable from the prior CT. Mild right hilar adenopathy, nodes up to 1.4 cm in short axis, similar to the prior CT. Trachea and esophagus are unremarkable. Lungs/Pleura: Ground-glass and confluent consolidation throughout most of the right lower lobe. Milder ground-glass opacity noted in the peripheral right upper lobes and left lower lobe. Interstitial thickening noted bilaterally. Trace right pleural effusion.  No left pleural effusion. No pneumothorax. Upper Abdomen: No acute findings. Musculoskeletal: Moderate compression fracture of L1, chronic and stable from a chest CT dated 03/22/2022. No acute fractures. No bone lesions. No chest wall mass. Review of the MIP images confirms the above findings. IMPRESSION: 1. No evidence of an acute pulmonary embolism. Small chronic segmental pulmonary embolism to the posterior basilar segment of the right lower lobe stable from the prior CTA. 2. Right lower lobe consolidation consistent with pneumonia. 3. Other ground-glass lung opacities and interstitial thickening are similar to the prior CT. Consider underlying mild congestive heart failure with  pulmonary edema as well as chronic interstitial lung disease in the differential. 4. Stable mild mediastinal right hilar lymphadenopathy, presumed reactive. 5. Stable mild cardiomegaly, stable changes from prior CABG surgery, coronary artery calcifications and aortic atherosclerosis. Aortic Atherosclerosis (ICD10-I70.0). Electronically Signed   By: Amie Portland M.[name].   On: 03/08/2023 16:02   DG Chest 2 View  Result Date: 03/08/2023 CLINICAL DATA:  Cough, hemoptysis, fever. EXAM: CHEST - 2 VIEW COMPARISON:  Radiographs 09/28/2022 and 09/27/2022. Chest CTA 04/28/2022. FINDINGS: 1059 hours. The heart size and mediastinal contours are stable status post median sternotomy and CABG. There is mild cardiomegaly and aortic atherosclerosis. Chronic central airway thickening with possible recurrent airspace disease at the right lung base. Trace bilateral pleural effusions are evident on the lateral view. No evidence of pneumothorax. Chronic L1 compression fracture appears unchanged from lumbar MRI 09/06/2019. No acute osseous findings are identified. Lucency laterally in the right upper quadrant the abdomen on the frontal examination corresponds with bowel on the lateral view. No definite pneumoperitoneum. IMPRESSION: 1. Possible recurrent airspace disease at the right lung base, suspicious for pneumonia. Trace bilateral pleural effusions. 2. Chronic L1 compression fracture. Electronically Signed   By: Carey Bullocks M.[name].   On: 03/08/2023 11:38     LOS: 0 days   Total time spent in review of labs and imaging, patient evaluation, formulation of plan, documentation and communication with family: 50 minutes  Lanae Boast, MD [facility]  03/09/2023, 10:30 AM

## 2023-03-09 NOTE — Plan of Care (Signed)
  Problem: Clinical Measurements: Goal: Respiratory complications will improve Outcome: Progressing Goal: Cardiovascular complication will be avoided Outcome: Progressing   Problem: Pain Management: Goal: General experience of comfort will improve Outcome: Progressing   Problem: Safety: Goal: Ability to remain free from injury will improve Outcome: Progressing

## 2023-03-09 NOTE — Plan of Care (Signed)

## 2023-03-09 NOTE — Progress Notes (Signed)
PHARMACY - PHYSICIAN COMMUNICATION CRITICAL VALUE ALERT - BLOOD CULTURE IDENTIFICATION (BCID)  Evan [name] is an 89 y.o. male who presented to [facility] on 03/08/2023 with a chief complaint of shortness of breath and altered mental status.  Assessment:  89 yo male with possible CAP and bacteriemia. BCID reports 3/4 bottles of Haemophilus influenzae - no resistance. Patient is afebrile, WBC 12.6 (down from 20.6).   Name of physician (or Provider) Contacted: none  Current antibiotics:  Ceftriaxone 12/4 >> Doxycycline 12/4 >>  Changes to prescribed antibiotics recommended:  Patient is on recommended antibiotics - No changes needed Follow up cultures and susceptibilities  No results found. However, due to the size of the patient record, not all encounters were searched. Please check Results Review for a complete set of results.  Roslyn Smiling, PharmD PGY1 Pharmacy Resident 03/09/2023 6:17 PM

## 2023-03-09 NOTE — Hospital Course (Addendum)
HPI: 89 y.o. male with medical history significant of hypertension, hyperlipidemia, GERD, CKD 3, BPH, anemia, atrial fibrillation, subdural hematoma, CVA, CAD status post CABG, QT prolongation, dementia, sundowning, hearing loss, anxiety, depression, chronic respiratory failure with hypoxia, COPD, lung cancer, radiation pneumonitis, rheumatoid arthritis, PE presenting with shortness of breath and altered mental status.  Per family he is hard of hearing, baseline dementia and was exposed to 2 people who were found later to have pneumonia at Thanksgiving.  Now, for the past 2 days patient has had a low-grade fever around 99.5 and has been reporting some shortness of breath as well as orthopnea and oral intake.  He has baseline tremors and has been worse past 2 days. In ED  BP- 110s to 130s systolic, respiratory rate in the 20s to 30s.  Labs>> mild hypokalemia creatinine stable at 1.53, albumin 3.2, T. bili 2.6. Leukocytosis 20.6, hemoglobin stable at 10.4, platelets stable at 80.  BNP elevated to 847.  Lactic acid borderline at 2.1 and repeat  1.5 RVP panel for flu COVID and RSV negative. Blood cultures sent CXR>>possible recurrent airspace disease suspicious for pneumonia and chronic L1 compression fracture noted. CTA PE>> no acute PE, small chronic segmental PE stable from prior CTA, right lower lobe consolidation, groundglass opacities and interstitial thickening similar to prior CT Patient is admitted for further management  Significant Events: Admitted 03/08/2023 for pneumonia   Significant Labs: Admission WBC 20.6, HgB 10.4, Plt 80, Na 135, K 3.4, Bicarb 18, BUN 24, Scr 1.53 03-08-2023 Blood cx positive for Haemophilus influenzae  Significant Imaging Studies: Admission CTPA shows No evidence of an acute pulmonary embolism. Small chronic segmental pulmonary embolism to the posterior basilar segment of the right lower lobe stable from the prior CTA. 2. Right lower lobe consolidation consistent  with pneumonia. 3. Other ground-glass lung opacities and interstitial thickening are similar to the prior CT. Consider underlying mild congestive heart failure with pulmonary edema as well as chronic interstitial lung disease in the differential. 4. Stable mild mediastinal right hilar lymphadenopathy, presumed reactive. 5. Stable mild cardiomegaly, stable changes from prior CABG surgery, coronary artery calcifications and aortic atherosclerosis. 03-10-2023 MBS shows Pt demonstrates very functional swallowing ability with mild oral dysphagia characterized by prolonged mastication, lingual rocking with solids, residue in the floor of mouth and residue around teeth with a sticky solid  03-10-2023 echo shows LVEF 60% 03-13-2023 CT chest Small bilateral pleural effusions are noted, right greater than left.   Visualization of lung fields is limited due to respiratory motion artifact. Right lower lobe airspace opacity is noted most consistent with pneumonia. Reticular densities are noted in both lung bases which may represent scarring or possibly edema.  Mildly enlarged mediastinal adenopathy is noted which most likely is reactive or inflammatory in etiology.  Antibiotic Therapy: Anti-infectives (From admission, onward)    Start     Dose/Rate Route Frequency Ordered Stop   03/11/23 1000  cefTRIAXone (ROCEPHIN) 2 g in sodium chloride 0.9 % 100 mL IVPB        2 g 200 mL/hr over 30 Minutes Intravenous Every 24 hours 03/10/23 1026     03/10/23 1115  cefTRIAXone (ROCEPHIN) 1 g in sodium chloride 0.9 % 100 mL IVPB        1 g 200 mL/hr over 30 Minutes Intravenous  Once 03/10/23 1026 03/10/23 1215   03/09/23 1000  cefTRIAXone (ROCEPHIN) 1 g in sodium chloride 0.9 % 100 mL IVPB  Status:  Discontinued  1 g 200 mL/hr over 30 Minutes Intravenous Every 24 hours 03/08/23 1556 03/08/23 1652   03/09/23 1000  cefTRIAXone (ROCEPHIN) 1 g in sodium chloride 0.9 % 100 mL IVPB  Status:  Discontinued        1 g 200 mL/hr  over 30 Minutes Intravenous Every 24 hours 03/08/23 1652 03/10/23 1026   03/08/23 2200  doxycycline (VIBRA-TABS) tablet 100 mg  Status:  Discontinued        100 mg Oral Every 12 hours 03/08/23 1556 03/10/23 0814   03/08/23 1400  cefTRIAXone (ROCEPHIN) 1 g in sodium chloride 0.9 % 100 mL IVPB        1 g 200 mL/hr over 30 Minutes Intravenous  Once 03/08/23 1346 03/08/23 1507   03/08/23 1400  doxycycline (VIBRA-TABS) tablet 100 mg        100 mg Oral  Once 03/08/23 1346 03/08/23 1435       Procedures:   Consultants: ID

## 2023-03-10 ENCOUNTER — Inpatient Hospital Stay (HOSPITAL_COMMUNITY): Payer: Medicare HMO

## 2023-03-10 ENCOUNTER — Encounter (HOSPITAL_COMMUNITY): Payer: Self-pay | Admitting: Internal Medicine

## 2023-03-10 DIAGNOSIS — I517 Cardiomegaly: Secondary | ICD-10-CM | POA: Diagnosis not present

## 2023-03-10 DIAGNOSIS — J14 Pneumonia due to Hemophilus influenzae: Secondary | ICD-10-CM | POA: Diagnosis not present

## 2023-03-10 LAB — BASIC METABOLIC PANEL
Anion gap: 9 (ref 5–15)
BUN: 32 mg/dL — ABNORMAL HIGH (ref 8–23)
CO2: 17 mmol/L — ABNORMAL LOW (ref 22–32)
Calcium: 8.3 mg/dL — ABNORMAL LOW (ref 8.9–10.3)
Chloride: 104 mmol/L (ref 98–111)
Creatinine, Ser: 1.41 mg/dL — ABNORMAL HIGH (ref 0.61–1.24)
GFR, Estimated: 46 mL/min — ABNORMAL LOW (ref >60)
Glucose, Bld: 105 mg/dL — ABNORMAL HIGH (ref 70–99)
Potassium: 2.9 mmol/L — ABNORMAL LOW (ref 3.5–5.1)
Sodium: 130 mmol/L — ABNORMAL LOW (ref 135–145)

## 2023-03-10 LAB — ECHOCARDIOGRAM COMPLETE
MV M vel: 4.93 m/s
MV Peak grad: 97.2 mm[Hg]
S' Lateral: 3 cm
Weight: 2296.31 [oz_av]

## 2023-03-10 LAB — MRSA NEXT GEN BY PCR, NASAL: MRSA by PCR Next Gen: DETECTED — AB

## 2023-03-10 LAB — CBC
HCT: 25.5 % — ABNORMAL LOW (ref 39.0–52.0)
Hemoglobin: 8.7 g/dL — ABNORMAL LOW (ref 13.0–17.0)
MCH: 34 pg (ref 26.0–34.0)
MCHC: 34.1 g/dL (ref 30.0–36.0)
MCV: 99.6 fL (ref 80.0–100.0)
Platelets: 66 10*3/uL — ABNORMAL LOW (ref 150–400)
RBC: 2.56 MIL/uL — ABNORMAL LOW (ref 4.22–5.81)
RDW: 14.6 % (ref 11.5–15.5)
WBC: 9 10*3/uL (ref 4.0–10.5)
nRBC: 0 % (ref 0.0–0.2)

## 2023-03-10 LAB — MAGNESIUM: Magnesium: 1.7 mg/dL (ref 1.7–2.4)

## 2023-03-10 MED ORDER — POTASSIUM CHLORIDE CRYS ER 20 MEQ PO TBCR
40.0000 meq | EXTENDED_RELEASE_TABLET | Freq: Two times a day (BID) | ORAL | Status: DC
  Administered 2023-03-10: 40 meq via ORAL
  Filled 2023-03-10: qty 2

## 2023-03-10 MED ORDER — GUAIFENESIN ER 600 MG PO TB12
600.0000 mg | ORAL_TABLET | Freq: Two times a day (BID) | ORAL | Status: DC
  Administered 2023-03-10 – 2023-03-13 (×6): 600 mg via ORAL
  Filled 2023-03-10 (×6): qty 1

## 2023-03-10 MED ORDER — SODIUM CHLORIDE 0.9 % IV SOLN
1.0000 g | Freq: Once | INTRAVENOUS | Status: AC
  Administered 2023-03-10: 1 g via INTRAVENOUS
  Filled 2023-03-10: qty 10

## 2023-03-10 MED ORDER — POTASSIUM CHLORIDE 10 MEQ/100ML IV SOLN
10.0000 meq | Freq: Once | INTRAVENOUS | Status: AC
Start: 2023-03-10 — End: 2023-03-10
  Administered 2023-03-10: 10 meq via INTRAVENOUS
  Filled 2023-03-10: qty 100

## 2023-03-10 MED ORDER — MAGNESIUM SULFATE 2 GM/50ML IV SOLN
2.0000 g | Freq: Once | INTRAVENOUS | Status: AC
  Administered 2023-03-10: 2 g via INTRAVENOUS
  Filled 2023-03-10: qty 50

## 2023-03-10 MED ORDER — POTASSIUM CHLORIDE CRYS ER 20 MEQ PO TBCR
40.0000 meq | EXTENDED_RELEASE_TABLET | Freq: Once | ORAL | Status: AC
  Administered 2023-03-10: 40 meq via ORAL
  Filled 2023-03-10: qty 2

## 2023-03-10 MED ORDER — POTASSIUM CHLORIDE 10 MEQ/100ML IV SOLN
10.0000 meq | INTRAVENOUS | Status: DC
  Administered 2023-03-10 (×3): 10 meq via INTRAVENOUS
  Filled 2023-03-10 (×3): qty 100

## 2023-03-10 MED ORDER — SODIUM CHLORIDE 0.9 % IV SOLN
2.0000 g | INTRAVENOUS | Status: DC
  Administered 2023-03-11 – 2023-03-13 (×3): 2 g via INTRAVENOUS
  Filled 2023-03-10 (×3): qty 20

## 2023-03-10 MED ORDER — NAPHAZOLINE-GLYCERIN 0.012-0.25 % OP SOLN
1.0000 [drp] | Freq: Four times a day (QID) | OPHTHALMIC | Status: DC | PRN
  Administered 2023-03-10: 2 [drp] via OPHTHALMIC
  Administered 2023-03-11 – 2023-03-12 (×3): 1 [drp] via OPHTHALMIC
  Administered 2023-03-13: 2 [drp] via OPHTHALMIC
  Filled 2023-03-10 (×2): qty 15

## 2023-03-10 MED ORDER — IPRATROPIUM-ALBUTEROL 0.5-2.5 (3) MG/3ML IN SOLN
3.0000 mL | RESPIRATORY_TRACT | Status: DC | PRN
  Administered 2023-03-10 – 2023-03-13 (×7): 3 mL via RESPIRATORY_TRACT
  Filled 2023-03-10 (×7): qty 3

## 2023-03-10 MED ORDER — ACETYLCYSTEINE 20 % IN SOLN
4.0000 mL | Freq: Two times a day (BID) | RESPIRATORY_TRACT | Status: DC
  Administered 2023-03-10 – 2023-03-13 (×6): 4 mL via RESPIRATORY_TRACT
  Filled 2023-03-10 (×8): qty 4

## 2023-03-10 NOTE — Progress Notes (Signed)
Modified Barium Swallow Study  Patient Details  Name: Evan [name] MRN: [id number] Date of Birth: May 28, 1927  Today's Date: 03/10/2023  Modified Barium Swallow completed.  Full report located under Chart Review in the Imaging Section.  History of Present Illness 89 y.o. male presenting with shortness of breath and altered mental status. He was exposed to 2 people who were found later to have pneumonia at Thanksgiving.  Now, for the past 2 days patient has had a low-grade fever around 99.5 and has been reporting some shortness of breath as well as orthopnea and oral intake.  He has been coughing when drinking at home and at the hospital.  CT shows right lower lobe consolidation, groundglass opacities and interstitial thickening similar to prior. Prior MBS in 2023 shows sensed aspriation of thin liquids, improved with small sips. Daughter reports pt eats and drinks too fast at home.  Pt with medical history significant of hypertension, hyperlipidemia, GERD, CKD 3, BPH, anemia, atrial fibrillation, subdural hematoma, CVA, CAD status post CABG, QT prolongation, dementia, sundowning, hearing loss, anxiety, depression, chronic respiratory failure with hypoxia, COPD, lung cancer, radiation pneumonitis, rheumatoid arthritis, Per family he is hard of hearing.   Clinical Impression Pt demonstrates very functional swallowing ability with mild oral dysphagia characterized by prolonged mastication, lingual rocking with solids, residue in the floor of mouth and residue around teeth with a sticky solid. Pt was able to clear residue with verbal cues. Though boluses spilled to pyriform sinsues prior to laryngeal elevation there was only one instance of trace frank silent penetration with thin liquids. PAS4. Pt did not cough during study as he did in the room. Suspect upright position in the chair was significantly beneficial. Pt was cheerful and participatory though hard of hearing. Recommend continuing nectar thick  liquids while in the hospital since positioning is often suboptimal. Told daughter to resume thin liquids and foods of choice (unrestricted regular) with upright posture after return home. Cues for oral clearance helpful. Will report to daughter. Factors that may increase risk of adverse event in presence of aspiration Rubye Oaks [employer] Clearance Coots 2021):    Swallow Evaluation Recommendations Recommendations: PO diet PO Diet Recommendation: Regular;Thin liquids (Level 0);Mildly thick liquids (Level 2, nectar thick) Liquid Administration via: Cup;Straw Medication Administration: Whole meds with puree Supervision: Staff to assist with self-feeding Swallowing strategies  : Slow rate;Small bites/sips Postural changes: Position pt fully upright for meals Oral care recommendations: Oral care BID (2x/day)      Marelin Tat, Riley Nearing 03/10/2023,1:31 PM

## 2023-03-10 NOTE — Progress Notes (Signed)
PROGRESS NOTE Evan [name]  ZOX:[id number] DOB: November 21, 1927 DOA: 03/08/2023 PCP: Donita Brooks, MD  Brief Narrative/Hospital Course: 89 y.o. male with medical history significant of hypertension, hyperlipidemia, GERD, CKD 3, BPH, anemia, atrial fibrillation, subdural hematoma, CVA, CAD status post CABG, QT prolongation, dementia, sundowning, hearing loss, anxiety, depression, chronic respiratory failure with hypoxia, COPD, lung cancer, radiation pneumonitis, rheumatoid arthritis, PE presenting with shortness of breath and altered mental status.  Per family he is hard of hearing, baseline dementia and was exposed to 2 people who were found later to have pneumonia at Thanksgiving.  Now, for the past 2 days patient has had a low-grade fever around 99.5 and has been reporting some shortness of breath as well as orthopnea and oral intake.  He has baseline tremors and has been worse past 2 days. In ED  BP- 110s to 130s systolic, respiratory rate in the 20s to 30s.  Labs>> mild hypokalemia creatinine stable at 1.53, albumin 3.2, T. bili 2.6. Leukocytosis 20.6, hemoglobin stable at 10.4, platelets stable at 80.  BNP elevated to 847.  Lactic acid borderline at 2.1 and repeat  1.5 RVP panel for flu COVID and RSV negative. Blood cultures sent CXR>>possible recurrent airspace disease suspicious for pneumonia and chronic L1 compression fracture noted. CTA PE>> no acute PE, small chronic segmental PE stable from prior CTA, right lower lobe consolidation, groundglass opacities and interstitial thickening similar to prior CT Patient is admitted for further management    Subjective: Patient seen and examined Daughter at the bedside He is alert awake and pleasantly confused with his dementia Overnight patient has been afebrile BP stable Labs shows hyponatremia very low potassium level, bicarb 17 creatinine slightly better 1.4 CBC with improved WBC count chronic anemia and thrombocytopenia BC ID detecting  haemophilus influenza 4/4   Assessment and Plan: Principal Problem:   PNA (pneumonia) Active Problems:   Chronic respiratory failure, unsp w hypoxia or hypercapnia (HCC)   History of pulmonary embolism   COPD (chronic obstructive pulmonary disease) (HCC)   CKD (chronic kidney disease), stage III (HCC)   Essential hypertension   Benign prostatic hyperplasia without lower urinary tract symptoms   Hyperlipidemia, unspecified   S/P CABG (coronary artery bypass graft)   Pulmonary embolism (HCC)   Permanent atrial fibrillation (HCC)   Gastro-esophageal reflux disease without esophagitis   Asthma, chronic   CAD (coronary artery disease)   GAD (generalized anxiety disorder)   Rheumatoid arthritis, unspecified (HCC)   Radiation pneumonitis (HCC)   Personal history of other venous thrombosis and embolism   Thalamic hemorrhage (HCC)   History of lung cancer   Depression   Hemiparesis due to old stroke (HCC)   Presence of coronary angioplasty implant and graft   Dementia without behavioral disturbance (HCC)   Anemia   Haemophilus influenza sepsis POA Pneumonia Has background history of chronic respiratory failure?(Not on oxygen), history of lung cancer, COPD, radiation pneumonitis, Chronic PE noticed but unchanged now with pneumonia> has Hemophilus bacteremia in BCID likely translocation from pneumonia-discussed with pharmacy, changed antibiotic to ceftriaxone, follow-up C/S, obtain echocardiogram. Leukocytosis resolved overall clinically improving. Cont ptot OOB, IS. SLP input appreciated now on nectar thick liquid/regular diet-MBS in process 12/6 Recent Labs  Lab 03/08/23 1033 03/08/23 1313 03/08/23 1607 03/09/23 0659 03/10/23 0541  WBC 20.6*  --   --  12.6* 9.0  LATICACIDVEN  --  2.1* 1.5  --   --     History of lung cancer status post radiation History of radiation pneumonitis  COPD versus asthma History of obstructive lung disease in the chart, cont home Symbicort     Hypertension BP stable. He was hypotensive 12/4 so continue to hold lasix metoprolol  Chronic HFmrEF: BNP 847 on admission previously in 160s to 550s, echo from July 2020 was 40-50-55%.  On Lasix 40 mg at home and holding for now-CT scan on admission shows groundglass opacities and possible mild CHF/chronic interstitial lung disease-resume Lasix once potassium is stable hopefully 12/7  Hyperlipidemia Cont rosuvastatin   GERD Cont ppi   CKD 3b Mild metabolic acidosis from ckd: B/l creat ~ 1.3.15 -holding stable and slightly better, monitor closely as below  Hypokalemia: Replacing aggressively again encourage p.o. Recent Labs    05/17/22 0106 06/28/22 0935 09/27/22 1731 09/28/22 0230 09/29/22 0012 10/14/22 0930 11/05/22 1312 03/08/23 1130 03/08/23 1152 03/09/23 0659 03/10/23 0541  BUN 47* 27* 29* 32* 35* 24 28* 24* 27* 30* 32*  CREATININE 1.55* 1.17 1.29* 1.55* 1.30* 1.51* 1.28* 1.53* 1.50* 1.50* 1.41*  CO2 19* 27 22 20* 21* 18* 24 18*  --  20* 17*  K 3.5 3.8 3.7 3.3* 4.0 4.0 3.9 3.4* 3.5 3.4* 2.9*    BPH With chronic Foley in place since March 2023 Continue Foley had failed voiding trial before continue Flomax finasteride   Chronic anemia Hb stable.  Continue to trend  Chronic thrombocytopenia: Platelet count as low as 42 in August, monitor closely while on anticoagulation  History of DVT/PE: on Eliquis   Paroxysmal atrial fibrillation: Likely paroxysmal, currently in sinus rhythm continue Eliquis holding metoprolol  History of CVA: Continue home rosuvastatin, Eliquis   CAD w/p CABG: Cont rosuvastatin, Eliquis QTc prolongation Monitor   Anxiety Depression Dementia w/ short-term memory issue Per chart has had some history of sundowning in the past.  Does have difficulty hearing which could contribute. Currently mentation much better and improving provide supportive care delirium precaution   Goals of care: Discussed POC with patient and his daughter  currently staying full code refusing palliative care evaluation at this time-stating was seen previously in March, she understand he is high risk for decompensation and readmission.  Prognosis does not appear bright  DVT prophylaxis: apixaban (ELIQUIS) tablet 2.5 mg Start: 03/08/23 2200 Code Status:   Code Status: Full Code Family Communication: plan of care discussed with patient and his daughter  at bedside. Patient status is:  admitted as observation but remains hospitalized for ongoing  because of pneumonia Level of care: Telemetry Medical   Dispo: The patient is from: home            Anticipated disposition: TBD Objective: Vitals last 24 hrs: Vitals:   03/10/23 0440 03/10/23 0500 03/10/23 0735 03/10/23 0802  BP: (!) 117/59   108/61  Pulse: 74   74  Resp: 18   16  Temp: 97.7 F (36.5 C)   98.1 F (36.7 C)  TempSrc: Oral   Oral  SpO2: 97%  97% 91%  Weight:  65.1 kg     Weight change: 0 kg  Physical Examination: General exam: alert awake, oriented x 2 HEENT:Oral mucosa moist, Ear/Nose WNL grossly Respiratory system: Bilaterally diminished BS,no use of accessory muscle Cardiovascular system: S1 & S2 +, No JVD. Gastrointestinal system: Abdomen soft,NT,ND, BS+ Nervous System: Alert, awake, moving all extremities,and following commands. Extremities: LE edema neg,distal peripheral pulses palpable and warm.  Skin: No rashes,no icterus. MSK: Weak and small muscle bulk   Medications reviewed:  Scheduled Meds:  apixaban  2.5 mg Oral BID  Chlorhexidine Gluconate Cloth  6 each Topical Daily   finasteride  5 mg Oral Daily   FLUoxetine  20 mg Oral Daily   mometasone-formoterol  2 puff Inhalation BID   pantoprazole  40 mg Oral q morning   potassium chloride  40 mEq Oral Once   rosuvastatin  5 mg Oral QHS   sodium chloride flush  3 mL Intravenous Q12H   tamsulosin  0.4 mg Oral QHS   Continuous Infusions:  cefTRIAXone (ROCEPHIN)  IV 1 g (03/10/23 0908)   potassium chloride 10  mEq (03/10/23 0959)   Diet Order             Diet Heart Room service appropriate? Yes; Fluid consistency: Nectar Thick  Diet effective now                   Intake/Output Summary (Last 24 hours) at 03/10/2023 1025 Last data filed at 03/10/2023 0500 Gross per 24 hour  Intake 480 ml  Output 1300 ml  Net -820 ml   Net IO Since Admission: -460 mL [03/10/23 1025]  Wt Readings from Last 3 Encounters:  03/10/23 65.1 kg  12/23/22 65.8 kg  11/05/22 66.2 kg     Unresulted Labs (From admission, onward)     Start     Ordered   03/10/23 0909  C Difficile Quick Screen w PCR reflex  (C Difficile quick screen w PCR reflex panel )  Once, for 24 hours,   TIMED       References:    CDiff Information Tool   03/10/23 0908   03/10/23 0500  Basic metabolic panel  Daily,   R     Question:  Specimen collection method  Answer:  Lab=Lab collect   03/09/23 1126   03/10/23 0500  CBC  Daily,   R     Question:  Specimen collection method  Answer:  Lab=Lab collect   03/09/23 1126          Data Reviewed: I have personally reviewed following labs and imaging studies CBC: Recent Labs  Lab 03/08/23 1033 03/08/23 1152 03/09/23 0659 03/10/23 0541  WBC 20.6*  --  12.6* 9.0  NEUTROABS 17.8*  --   --   --   HGB 10.4* 10.5* 9.2* 8.7*  HCT 32.2* 31.0* 27.2* 25.5*  MCV 103.2*  --  100.0 99.6  PLT 80*  --  67* 66*   Basic Metabolic Panel:  Recent Labs  Lab 03/08/23 1130 03/08/23 1152 03/09/23 0659 03/10/23 0540 03/10/23 0541  NA 135 136 135  --  130*  K 3.4* 3.5 3.4*  --  2.9*  CL 104 104 105  --  104  CO2 18*  --  20*  --  17*  GLUCOSE 119* 116* 111*  --  105*  BUN 24* 27* 30*  --  32*  CREATININE 1.53* 1.50* 1.50*  --  1.41*  CALCIUM 9.1  --  8.6*  --  8.3*  MG 1.7  --   --  1.7  --    GFR: Estimated Creatinine Clearance: 26.3 mL/min (A) (by C-G formula based on SCr of 1.41 mg/dL (H)). Liver Function Tests:  Recent Labs  Lab 03/08/23 1130 03/09/23 0659  AST 18 19  ALT 10 11   ALKPHOS 72 64  BILITOT 2.6* 1.5*  PROT 6.9 5.8*  ALBUMIN 3.2* 2.6*   Recent Labs  Lab 03/08/23 1030  GLUCAP 127*  sepsis Labs: Recent Labs  Lab 03/08/23 1313 03/08/23 1607  LATICACIDVEN 2.1* 1.5    Recent Results (from the past 240 hour(s))  Resp panel by RT-PCR (RSV, Flu A&B, Covid) Anterior Nasal Swab     Status: None   Collection Time: 03/08/23 10:33 AM   Specimen: Anterior Nasal Swab  Result Value Ref Range Status   SARS Coronavirus 2 by RT PCR NEGATIVE NEGATIVE Final   Influenza A by PCR NEGATIVE NEGATIVE Final   Influenza B by PCR NEGATIVE NEGATIVE Final    Comment: (NOTE) The Xpert Xpress SARS-CoV-2/FLU/RSV plus assay is intended as an aid in the diagnosis of influenza from Nasopharyngeal swab specimens and should not be used as a sole basis for treatment. Nasal washings and aspirates are unacceptable for Xpert Xpress SARS-CoV-2/FLU/RSV testing.  Fact Sheet for Patients: BloggerCourse.com  Fact Sheet for Healthcare Providers: SeriousBroker.it  This test is not yet approved or cleared by the [place] FDA and has been authorized for detection and/or diagnosis of SARS-CoV-2 by FDA under an Emergency Use Authorization (EUA). This EUA will remain in effect (meaning this test can be used) for the duration of the COVID-19 declaration under Section 564(b)(1) of the Act, 21 U.S.C. section 360bbb-3(b)(1), unless the authorization is terminated or revoked.     Resp Syncytial Virus by PCR NEGATIVE NEGATIVE Final    Comment: (NOTE) Fact Sheet for Patients: BloggerCourse.com  Fact Sheet for Healthcare Providers: SeriousBroker.it  This test is not yet approved or cleared by the [place] FDA and has been authorized for detection and/or diagnosis of SARS-CoV-2 by FDA under an Emergency Use Authorization (EUA). This EUA will remain in effect (meaning this test  can be used) for the duration of the COVID-19 declaration under Section 564(b)(1) of the Act, 21 U.S.C. section 360bbb-3(b)(1), unless the authorization is terminated or revoked.  Performed at [facility], [address] [place], [place] [address]   Culture, blood (routine x 2)     Status: Abnormal (Preliminary result)   Collection Time: 03/08/23 10:45 AM   Specimen: BLOOD RIGHT FOREARM  Result Value Ref Range Status   Specimen Description BLOOD RIGHT FOREARM  Final   Special Requests   Final    BOTTLES DRAWN AEROBIC AND ANAEROBIC Blood Culture results may not be optimal due to an inadequate volume of blood received in culture bottles   Culture  Setup Time   Final    GRAM NEGATIVE RODS IN BOTH AEROBIC AND ANAEROBIC BOTTLES CRITICAL RESULT CALLED TO, READ BACK BY AND VERIFIED WITH: PHARMD MADISON OWEN ON 03/09/23 @ 1751 BY DRT    Culture (A)  Final    HAEMOPHILUS INFLUENZAE BETA LACTAMASE NEGATIVE HEALTH DEPARTMENT NOTIFIED Performed at [facility], [address] [place], [place] [address]    Report Status PENDING  Incomplete  Blood Culture ID Panel (Reflexed)     Status: Abnormal   Collection Time: 03/08/23 10:45 AM  Result Value Ref Range Status   Enterococcus faecalis NOT DETECTED NOT DETECTED Final   Enterococcus Faecium NOT DETECTED NOT DETECTED Final   Listeria monocytogenes NOT DETECTED NOT DETECTED Final   Staphylococcus species NOT DETECTED NOT DETECTED Final   Staphylococcus aureus (BCID) NOT DETECTED NOT DETECTED Final   Staphylococcus epidermidis NOT DETECTED NOT DETECTED Final   Staphylococcus lugdunensis NOT DETECTED NOT DETECTED Final   Streptococcus species NOT DETECTED NOT DETECTED Final   Streptococcus agalactiae NOT DETECTED NOT DETECTED Final   Streptococcus pneumoniae NOT DETECTED NOT DETECTED Final   Streptococcus pyogenes NOT DETECTED NOT DETECTED Final  A.calcoaceticus-baumannii NOT DETECTED NOT DETECTED Final   Bacteroides fragilis  NOT DETECTED NOT DETECTED Final   Enterobacterales NOT DETECTED NOT DETECTED Final   Enterobacter cloacae complex NOT DETECTED NOT DETECTED Final   Escherichia coli NOT DETECTED NOT DETECTED Final   Klebsiella aerogenes NOT DETECTED NOT DETECTED Final   Klebsiella oxytoca NOT DETECTED NOT DETECTED Final   Klebsiella pneumoniae NOT DETECTED NOT DETECTED Final   Proteus species NOT DETECTED NOT DETECTED Final   Salmonella species NOT DETECTED NOT DETECTED Final   Serratia marcescens NOT DETECTED NOT DETECTED Final   Haemophilus influenzae DETECTED (A) NOT DETECTED Final    Comment: CRITICAL RESULT CALLED TO, READ BACK BY AND VERIFIED WITH: PHARMD MADISON OWEN ON 03/09/23 @ 1751 BY DRT    Neisseria meningitidis NOT DETECTED NOT DETECTED Final   Pseudomonas aeruginosa NOT DETECTED NOT DETECTED Final   Stenotrophomonas maltophilia NOT DETECTED NOT DETECTED Final   Candida albicans NOT DETECTED NOT DETECTED Final   Candida auris NOT DETECTED NOT DETECTED Final   Candida glabrata NOT DETECTED NOT DETECTED Final   Candida krusei NOT DETECTED NOT DETECTED Final   Candida parapsilosis NOT DETECTED NOT DETECTED Final   Candida tropicalis NOT DETECTED NOT DETECTED Final   Cryptococcus neoformans/gattii NOT DETECTED NOT DETECTED Final    Comment: Performed at [facility], [address], [place], [place] [address]  Culture, blood (routine x 2)     Status: None (Preliminary result)   Collection Time: 03/08/23 10:50 AM   Specimen: BLOOD  Result Value Ref Range Status   Specimen Description BLOOD RIGHT ANTECUBITAL  Final   Special Requests   Final    BOTTLES DRAWN AEROBIC AND ANAEROBIC Blood Culture results may not be optimal due to an inadequate volume of blood received in culture bottles   Culture  Setup Time   Final    GRAM NEGATIVE RODS IN BOTH AEROBIC AND ANAEROBIC BOTTLES CRITICAL VALUE NOTED.  VALUE IS CONSISTENT WITH PREVIOUSLY REPORTED AND CALLED VALUE. Performed at [facility], [address], [place], [place] [address]    Culture GRAM NEGATIVE RODS  Final   Report Status PENDING  Incomplete    Antimicrobials: Anti-infectives (From admission, onward)    Start     Dose/Rate Route Frequency Ordered Stop   03/09/23 1000  cefTRIAXone (ROCEPHIN) 1 g in sodium chloride 0.9 % 100 mL IVPB  Status:  Discontinued        1 g 200 mL/hr over 30 Minutes Intravenous Every 24 hours 03/08/23 1556 03/08/23 1652   03/09/23 1000  cefTRIAXone (ROCEPHIN) 1 g in sodium chloride 0.9 % 100 mL IVPB        1 g 200 mL/hr over 30 Minutes Intravenous Every 24 hours 03/08/23 1652     03/08/23 2200  doxycycline (VIBRA-TABS) tablet 100 mg  Status:  Discontinued        100 mg Oral Every 12 hours 03/08/23 1556 03/10/23 0814   03/08/23 1400  cefTRIAXone (ROCEPHIN) 1 g in sodium chloride 0.9 % 100 mL IVPB        1 g 200 mL/hr over 30 Minutes Intravenous  Once 03/08/23 1346 03/08/23 1507   03/08/23 1400  doxycycline (VIBRA-TABS) tablet 100 mg        100 mg Oral  Once 03/08/23 1346 03/08/23 1435      Culture/Microbiology    Component Value Date/Time   SDES BLOOD RIGHT ANTECUBITAL 03/08/2023 1050   SPECREQUEST  03/08/2023 1050  BOTTLES DRAWN AEROBIC AND ANAEROBIC Blood Culture results may not be optimal due to an inadequate volume of blood received in culture bottles   CULT GRAM NEGATIVE RODS 03/08/2023 1050   REPTSTATUS PENDING 03/08/2023 1050    Radiology Studies: CT Angio Chest PE W and/or Wo Contrast  Result Date: 03/08/2023 CLINICAL DATA:  Cough and low-grade fever. EXAM: CT ANGIOGRAPHY CHEST WITH CONTRAST TECHNIQUE: Multidetector CT imaging of the chest was performed using the standard protocol during bolus administration of intravenous contrast. Multiplanar CT image reconstructions and MIPs were obtained to evaluate the vascular anatomy. RADIATION DOSE REDUCTION: This exam was performed according to the departmental dose-optimization program which includes automated exposure  control, adjustment of the mA and/or kV according to patient size and/or use of iterative reconstruction technique. CONTRAST:  75mL OMNIPAQUE IOHEXOL 350 MG/ML SOLN COMPARISON:  04/28/2022 FINDINGS: Cardiovascular: Pulmonary arteries are well opacified. There is chronic opacification of a segmental branch to the posterior basilar segment of the right lower lobe, unchanged from the prior CTA. No evidence of an acute pulmonary embolism. Stable changes from previous CABG surgery. Three-vessel coronary artery calcifications. No pericardial effusion. Heart mildly enlarged. Dilated right pulmonary artery to 3.4 cm. Aorta is normal in caliber. Stable aortic atherosclerotic calcifications. Mediastinum/Nodes: No neck base, mediastinal or hilar masses. Mild mediastinal and right hilar adenopathy. Superior right paratracheal mediastinal lymph node measures 1.4 cm in short axis. Azygous level right paratracheal lymph node measures 1 cm short axis. Shotty subcarinal lymph node approximally 1.4 cm short axis. These nodes are stable from the prior CT. Mild right hilar adenopathy, nodes up to 1.4 cm in short axis, similar to the prior CT. Trachea and esophagus are unremarkable. Lungs/Pleura: Ground-glass and confluent consolidation throughout most of the right lower lobe. Milder ground-glass opacity noted in the peripheral right upper lobes and left lower lobe. Interstitial thickening noted bilaterally. Trace right pleural effusion.  No left pleural effusion. No pneumothorax. Upper Abdomen: No acute findings. Musculoskeletal: Moderate compression fracture of L1, chronic and stable from a chest CT dated 03/22/2022. No acute fractures. No bone lesions. No chest wall mass. Review of the MIP images confirms the above findings. IMPRESSION: 1. No evidence of an acute pulmonary embolism. Small chronic segmental pulmonary embolism to the posterior basilar segment of the right lower lobe stable from the prior CTA. 2. Right lower lobe  consolidation consistent with pneumonia. 3. Other ground-glass lung opacities and interstitial thickening are similar to the prior CT. Consider underlying mild congestive heart failure with pulmonary edema as well as chronic interstitial lung disease in the differential. 4. Stable mild mediastinal right hilar lymphadenopathy, presumed reactive. 5. Stable mild cardiomegaly, stable changes from prior CABG surgery, coronary artery calcifications and aortic atherosclerosis. Aortic Atherosclerosis (ICD10-I70.0). Electronically Signed   By: Amie Portland M.D.   On: 03/08/2023 16:02   DG Chest 2 View  Result Date: 03/08/2023 CLINICAL DATA:  Cough, hemoptysis, fever. EXAM: CHEST - 2 VIEW COMPARISON:  Radiographs 09/28/2022 and 09/27/2022. Chest CTA 04/28/2022. FINDINGS: 1059 hours. The heart size and mediastinal contours are stable status post median sternotomy and CABG. There is mild cardiomegaly and aortic atherosclerosis. Chronic central airway thickening with possible recurrent airspace disease at the right lung base. Trace bilateral pleural effusions are evident on the lateral view. No evidence of pneumothorax. Chronic L1 compression fracture appears unchanged from lumbar MRI 09/06/2019. No acute osseous findings are identified. Lucency laterally in the right upper quadrant the abdomen on the frontal examination corresponds with bowel on the lateral  view. No definite pneumoperitoneum. IMPRESSION: 1. Possible recurrent airspace disease at the right lung base, suspicious for pneumonia. Trace bilateral pleural effusions. 2. Chronic L1 compression fracture. Electronically Signed   By: Carey Bullocks M.D.   On: 03/08/2023 11:38     LOS: 1 day   Total time spent in review of labs and imaging, patient evaluation, formulation of plan, documentation and communication with family: 50 minutes  Lanae Boast, MD [facility]  03/10/2023, 10:25 AM

## 2023-03-10 NOTE — Progress Notes (Signed)
Echo attempted. Pt going to radiology for test. Will attempt again as schedule permits.

## 2023-03-10 NOTE — Progress Notes (Signed)
    Durable Medical Equipment  (From admission, onward)           Start     Ordered   03/10/23 1618  For home use only DME Air overlay mattress  Once       Comments: Patient has open sacral wound - 5cm x 1 cm - with wound care needs.   03/10/23 1619

## 2023-03-10 NOTE — Evaluation (Signed)
Occupational Therapy Evaluation Patient Details Name: ROYLE SABATH MRN: [id number] DOB: 1927-11-04 Today's Date: 03/10/2023   History of Present Illness 89 y.o. male admitted 12/4 with Pneumonia. PMH: CAD sp CABG, systolic  CHF EF 84-13% BPH, esophageal ulcer, GERD, HLD, HTN RA, hx of subdural hematoma in the past, HOH, thrombocytopenia   Clinical Impression   This 89 yo male admitted with above presents to acute OT with PLOF of needing A for in and OOB, transfers, and ADLs; but could feed himself post setup. Pt eating lunch when I entered and RN asked about built up handles because she was having to feed him. This is what session focused on today providing red tubing for utensils and positioning food plate in his lap (easier reach). Pt will continue to benefit from acute OT with follow up HHOT.      If plan is discharge home, recommend the following: A lot of help with walking and/or transfers;A lot of help with bathing/dressing/bathroom;Assistance with cooking/housework;Assistance with feeding;Help with stairs or ramp for entrance;Assist for transportation;Direct supervision/assist for financial management;Direct supervision/assist for medications management    Functional Status Assessment  Patient has had a recent decline in their functional status and demonstrates the ability to make significant improvements in function in a reasonable and predictable amount of time.  Equipment Recommendations  None recommended by OT       Precautions / Restrictions Precautions Precautions: Fall Required Braces or Orthoses: Other Brace Other Brace: Rt AFO Restrictions Weight Bearing Restrictions: No             ADL either performed or assessed with clinical judgement   ADL Overall ADL's : Needs assistance/impaired   Eating/Feeding Details (indicate cue type and reason): with proper set up, positioning, and built up handles for utensils opt can be Mod I for eating items that do not easily  fall off of utensils Grooming: Wash/dry face;Bed level;Minimal assistance                                 General ADL Comments: Family does pt's bathing and dressing for him. Either bird bath or shower (if son and dtr both there to A)     Vision Baseline Vision/History: 1 Wears glasses Ability to See in Adequate Light: 0 Adequate Patient Visual Report: No change from baseline              Pertinent Vitals/Pain Pain Assessment Pain Assessment: No/denies pain     Extremity/Trunk Assessment Upper Extremity Assessment Upper Extremity Assessment: Right hand dominant;RUE deficits/detail;LUE deficits/detail RUE Deficits / Details: tremors and decreased coordination --pre-exsisiting RUE Coordination: decreased fine motor;decreased gross motor LUE Deficits / Details: generalized weakness           Communication Communication Communication: Hearing impairment   Cognition Arousal: Alert Behavior During Therapy: WFL for tasks assessed/performed Overall Cognitive Status: Impaired/Different from baseline                                 General Comments: Some due to [facility]. Pt reports he always does bed baths (dtr states they do get him in the shower). Pt reports they feed him at home (dtr reports he feeds himself)                Home Living Family/patient expects to be discharged to:: Private residence Living Arrangements: Children Available Help at Discharge:  Family;Available 24 hours/day Type of Home: House Home Access: Ramped entrance     Home Layout: Two level;Able to live on main level with bedroom/bathroom     Bathroom Shower/Tub: Producer, television/film/video: Handicapped height     Home Equipment: Shower seat;Grab bars - tub/shower;Hospital bed;Wheelchair - manual;Hand held shower head;Other (comment) (Bil platform RW)   Additional Comments: uses Bil plaftorm RW at baseline      Prior Functioning/Environment Prior Level of  Function : Needs assist       Physical Assist : ADLs (physical);Mobility (physical) Mobility (physical): Transfers;Gait;Bed mobility ADLs (physical): Bathing;Dressing;Toileting;IADLs Mobility Comments: Family provides physical assist with all gait, uses bil platform RW in the house. ADLs Comments: Family assists with all ADLs; pt can feed himself        OT Problem List: Decreased strength;Decreased range of motion;Decreased coordination;Impaired UE functional use      OT Treatment/Interventions: Self-care/ADL training;Patient/family education;Balance training;DME and/or AE instruction    OT Goals(Current goals can be found in the care plan section) Acute Rehab OT Goals Patient Stated Goal: agreeable to try built up utensi for self feeding OT Goal Formulation: With patient/family Time For Goal Achievement: 03/24/23 Potential to Achieve Goals: Fair  OT Frequency: Min 1X/week       AM-PAC OT "6 Clicks" Daily Activity     Outcome Measure Help from another person eating meals?: A Little Help from another person taking care of personal grooming?: A Little Help from another person toileting, which includes using toliet, bedpan, or urinal?: Total Help from another person bathing (including washing, rinsing, drying)?: A Lot Help from another person to put on and taking off regular upper body clothing?: Total Help from another person to put on and taking off regular lower body clothing?: Total 6 Click Score: 11   End of Session    Activity Tolerance: Patient tolerated treatment well Patient left: in bed;with call bell/phone within reach;with nursing/sitter in room  OT Visit Diagnosis: Unsteadiness on feet (R26.81);Other abnormalities of gait and mobility (R26.89);Repeated falls (R29.6);Muscle weakness (generalized) (M62.81)                Time: 1450-1510 OT Time Calculation (min): 20 min Charges:  OT General Charges $OT Visit: 1 Visit OT Evaluation $OT Eval Moderate Complexity:  1 Mod  Cathy L. OT Acute Rehabilitation Services Office [phone]    Evette Georges 03/10/2023, 4:03 PM

## 2023-03-10 NOTE — Consult Note (Signed)
WOC Nurse Consult Note: Reason for Consult: Requested to Assess Stage 1 Pressure injury Wound type: Pressure injury stage 1 Pressure Injury POA: Yes Measurement: 5X1.5 Wound bed: Skin intact, redness.  Dressing procedure/placement/frequency: Foam dressing to prevent further injury. Change position every 2 hours. Continue assessing by bed nurse.  WOC team will not plan to follow further.  Please reconsult if further assistance is needed. Thank-you,  Denyse Amass BSN, RN, [employer], WOC  (Pager: [phone])

## 2023-03-10 NOTE — Progress Notes (Signed)
  Echocardiogram 2D Echocardiogram has been performed.  Evan [name] 03/10/2023, 2:39 PM

## 2023-03-10 NOTE — Progress Notes (Signed)
Physical Therapy Treatment Patient Details Name: Evan [name] MRN: [id number] DOB: 03/08/1928 Today's Date: 03/10/2023   History of Present Illness 89 y.o. male admitted 12/4 with Pneumonia. PMH: CAD sp CABG, systolic  CHF EF 40-98% BPH, esophageal ulcer, GERD, HLD, HTN RA, hx of subdural hematoma in the past, HOH, thrombocytopenia    PT Comments  Pt received in supine and agreeable to session with wife present and supportive throughout. Pt demonstrates improved bed mobility to sit to EOB. Pt demonstrates quick fatigue with R lateral lean in sitting and pt requesting to lay back to rest requiring encouragement to continue with mobility tasks. Pt able to stand from EOB x2, however demonstrates difficulty transitioning BUE to upright walker and is only able to place LUE on platform with limited support. Pt with strong posterior and R lateral leans requiring mod-max A to maintain balance. Pt's R foot noted to move forward and to the L, however pt is unable to correct with cues. Pt demonstrates little use of RUE during session and maintains elbow flexion and shoulder adduction. Pt continues to benefit from PT services to progress toward functional mobility goals.     If plan is discharge home, recommend the following: A lot of help with walking and/or transfers;A little help with bathing/dressing/bathroom;Assistance with cooking/housework;Direct supervision/assist for medications management;Direct supervision/assist for financial management;Assist for transportation;Help with stairs or ramp for entrance;Supervision due to cognitive status   Can travel by private vehicle        Equipment Recommendations  None recommended by PT    Recommendations for Other Services       Precautions / Restrictions Precautions Precautions: Fall Required Braces or Orthoses: Other Brace Other Brace: Rt AFO Restrictions Weight Bearing Restrictions: No     Mobility  Bed Mobility Overal bed mobility: Needs  Assistance Bed Mobility: Rolling, Sidelying to Sit, Sit to Sidelying Rolling: Contact guard assist Sidelying to sit: Contact guard assist     Sit to sidelying: Mod assist General bed mobility comments: Cues for technique and pt able to sit to EOB with CGA. Assist with bedpad to scoot forward. Mod A to elevate BLE back to EOB due to RLE continuing to slip back off    Transfers Overall transfer level: Needs assistance Equipment used:  (upright walker) Transfers: Sit to/from Stand Sit to Stand: Mod assist, From elevated surface           General transfer comment: Mod A for power up to stand from EOB x2. Cues for anterior weight shift and upright posture. strong posterior and R lateral lean. Assist for BLE foot placement for set up. Pt unable to get RUE to platform on upright walker.       Balance Overall balance assessment: Needs assistance Sitting-balance support: No upper extremity supported, Feet supported Sitting balance-[name] Scale: Fair Sitting balance - Comments: sitting EOB with R lateral lean with increased fatigue   Standing balance support: Bilateral upper extremity supported, Reliant on assistive device for balance, During functional activity Standing balance-[name] Scale: Poor Standing balance comment: Poor progressing to zero with increased fatigue. Pt with heavy posterior and R lateral lean requiring mod-max A despite cues                            Cognition Arousal: Alert Behavior During Therapy: WFL for tasks assessed/performed Overall Cognitive Status: Impaired/Different from baseline  Exercises      General Comments        Pertinent Vitals/Pain Pain Assessment Pain Assessment: Faces Faces Pain Scale: Hurts little more Pain Location: RUE Pain Descriptors / Indicators: Aching, Discomfort, Guarding Pain Intervention(s): Monitored during session, Repositioned, Limited activity within  patient's tolerance     PT Goals (current goals can now be found in the care plan section) Acute Rehab PT Goals Patient Stated Goal: Feel better, reduce buttock pain PT Goal Formulation: With patient/family Time For Goal Achievement: 03/23/23 Progress towards PT goals: Progressing toward goals    Frequency    Min 1X/week       AM-PAC PT "6 Clicks" Mobility   Outcome Measure  Help needed turning from your back to your side while in a flat bed without using bedrails?: A Little Help needed moving from lying on your back to sitting on the side of a flat bed without using bedrails?: A Little Help needed moving to and from a bed to a chair (including a wheelchair)?: A Lot Help needed standing up from a chair using your arms (e.g., wheelchair or bedside chair)?: A Lot Help needed to walk in hospital room?: Total Help needed climbing 3-5 steps with a railing? : Total 6 Click Score: 12    End of Session Equipment Utilized During Treatment: Gait belt (R AFO) Activity Tolerance: Patient limited by fatigue Patient left: in bed;with call bell/phone within reach;with family/visitor present Nurse Communication: Mobility status PT Visit Diagnosis: Unsteadiness on feet (R26.81);Muscle weakness (generalized) (M62.81);Difficulty in walking, not elsewhere classified (R26.2);Other symptoms and signs involving the nervous system (R29.898);Pain     Time: 1610-9604 PT Time Calculation (min) (ACUTE ONLY): 21 min  Charges:    $Therapeutic Activity: 8-22 mins PT General Charges $$ ACUTE PT VISIT: 1 Visit                    Johny Shock, PTA Acute Rehabilitation Services Secure Chat Preferred  [phone]    Johny Shock 03/10/2023, 11:27 AM

## 2023-03-10 NOTE — Progress Notes (Addendum)
Transition of Care [facility]) - Inpatient Brief Assessment   Patient Details  Name: ADDAI AFSHARI MRN: [id number] Date of Birth: [birth year]  Transition of [facility] CM/SW Contact:    Janae Bridgeman, RN Phone Number: 03/10/2023, 2:25 PM   Clinical Narrative: CM attempted to meet with the patient in the room to discuss TOC needs.  The patient admitted to the hospital with pneumonia and is currently on room air.  No family present in the room at this time and the patient was currently having an ECHO completed.  I called and left a voicemail with the patient's daughter by phone to discuss patient TOC needs.  I called [facility] and the patient was recently discharged from their services on 12/22/2022.  TOC team would continue to follow the patient for needs for home.  03/10/23 1621 - CM met with the patient's daughter at the bedside and the patient's daughter states that the patient needs an air mattress for his existing hospital bed from [employer].  Wounds noted on nursing assessment and provided in the DME order for the air mattress and DME note - to be co-signed by the MD.  Medicare choice was offered to the daughter and she states that [facility] discharged the patient and she did not want to return for services and [facility] was requested.  I called [facility] and Kandee Keen, RNCM accepted for services.  HH orders placed to be co-signed by the MD.  I called [employer] and requested the air mattess be provided to the home closer to discharge.  CM will continue to follow the patient for TOC needs.   Transition of Care Asessment: Insurance and Status: (P) Insurance coverage has been reviewed Patient has primary care physician: (P) Yes Home environment has been reviewed: (P) from home with family Prior level of function:: (P) family assistance in the home Prior/Current Home Services: (P) Current home services (Left message with the patient's daughter to discuss TOC needs and determine if  patient had current home services) Social Determinants of Health Reivew: (P) SDOH reviewed needs interventions Readmission risk has been reviewed: (P) Yes Transition of care needs: (P) transition of care needs identified, TOC will continue to follow

## 2023-03-11 ENCOUNTER — Inpatient Hospital Stay (HOSPITAL_COMMUNITY): Payer: Medicare HMO

## 2023-03-11 DIAGNOSIS — J14 Pneumonia due to Hemophilus influenzae: Secondary | ICD-10-CM | POA: Diagnosis not present

## 2023-03-11 LAB — BASIC METABOLIC PANEL
Anion gap: 8 (ref 5–15)
BUN: 20 mg/dL (ref 8–23)
CO2: 16 mmol/L — ABNORMAL LOW (ref 22–32)
Calcium: 8.3 mg/dL — ABNORMAL LOW (ref 8.9–10.3)
Chloride: 107 mmol/L (ref 98–111)
Creatinine, Ser: 1.25 mg/dL — ABNORMAL HIGH (ref 0.61–1.24)
GFR, Estimated: 54 mL/min — ABNORMAL LOW (ref >60)
Glucose, Bld: 118 mg/dL — ABNORMAL HIGH (ref 70–99)
Potassium: 4.1 mmol/L (ref 3.5–5.1)
Sodium: 131 mmol/L — ABNORMAL LOW (ref 135–145)

## 2023-03-11 LAB — CBC
HCT: 24.6 % — ABNORMAL LOW (ref 39.0–52.0)
Hemoglobin: 8.3 g/dL — ABNORMAL LOW (ref 13.0–17.0)
MCH: 33.5 pg (ref 26.0–34.0)
MCHC: 33.7 g/dL (ref 30.0–36.0)
MCV: 99.2 fL (ref 80.0–100.0)
Platelets: 65 10*3/uL — ABNORMAL LOW (ref 150–400)
RBC: 2.48 MIL/uL — ABNORMAL LOW (ref 4.22–5.81)
RDW: 14.3 % (ref 11.5–15.5)
WBC: 8.1 10*3/uL (ref 4.0–10.5)
nRBC: 0 % (ref 0.0–0.2)

## 2023-03-11 LAB — CULTURE, BLOOD (ROUTINE X 2)

## 2023-03-11 NOTE — Consult Note (Signed)
[facility]  Total days of antibiotics 4 ceftriaxone       Reason for Consult: h.flu pneumonia and secondary bacteremia   Referring Physician: Lanae Boast  Principal Problem:   PNA (pneumonia) Active Problems:   Gastro-esophageal reflux disease without esophagitis   Hyperlipidemia, unspecified   Essential hypertension   Asthma, chronic   S/P CABG (coronary artery bypass graft)   Benign prostatic hyperplasia without lower urinary tract symptoms   CAD (coronary artery disease)   GAD (generalized anxiety disorder)   Permanent atrial fibrillation (HCC)   Rheumatoid arthritis, unspecified (HCC)   Radiation pneumonitis (HCC)   Personal history of other venous thrombosis and embolism   Pulmonary embolism (HCC)   Thalamic hemorrhage (HCC)   History of lung cancer   Depression   CKD (chronic kidney disease), stage III (HCC)   COPD (chronic obstructive pulmonary disease) (HCC)   Hemiparesis due to old stroke (HCC)   Chronic respiratory failure, unsp w hypoxia or hypercapnia (HCC)   History of pulmonary embolism   Presence of coronary angioplasty implant and graft   Dementia without behavioral disturbance (HCC)   Anemia    HPI: Evan [name] is a 89 y.o. male with history of dementia, hx of CVA, COPD, who was admitted for fevers, cough, worsening encephalopathy on 12/4 and found to have leukocytosis of 12.6K, mild aki from his baseline,  chest CT ruled out PE, but found RLL consolidation per my read. Also, his infectious work up revealed to have H.flue bacteremia. He did undergo TTE which did not show signs of vegetation. Patient is feeling somewhat better, he is having improved appetite per his daughter, but she states he is still mildly confused.  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 07/14/2018    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   Altered mental status 08/21/2018   Anginal chest pain at rest [facility])    Chronic non, controlled on Lorazepam   Anxiety    Arthritis    "all over"    BPH (benign prostatic hyperplasia)    CAD (coronary artery disease)    Cellulitis of arm, right 06/01/2018   Cellulitis of left axilla    Chronic lower back pain    Complication of anesthesia    "problems making water afterwards"   Depression    DJD (degenerative joint disease)    Esophageal ulcer without bleeding    bid ppi indefinitely   Fall 07/16/2018   Family history of adverse reaction to anesthesia    "children w/PONV"   GERD (gastroesophageal reflux disease)    Headache    "couple/week maybe" (09/04/2015)   Hemochromatosis    Possible, elevated iron stores, hook-like osteophytes on hand films, normal LFTs   Hepatitis    "yellow jaundice as a baby"   History of ASCVD    MULTIVESSEL   Hyperlipidemia    Hypertension    Hypotension    Mild aortic stenosis 06/23/2021   Osteoarthritis    Pneumonia    "several times; got a little now" (09/04/2015)   Rash and nonspecific skin eruption 06/01/2018   Rheumatoid arthritis (HCC)    RSV (respiratory syncytial virus pneumonia) 05/15/2022   Subdural hematoma (HCC)    small after fall 09/2016-plavix held, neurosurgery consulted.  Asymptomatic.     Thromboembolism (HCC) 02/2018   on Lovenox lifelong  since he failed PO Eliquis    Allergies:  Allergies  Allergen Reactions   Feldene [Piroxicam] Rash    Blistering rash     Neomycin-Polymyxin-Hc Itching   Statins Other (See Comments)    Myalgias   Latex Rash    Band Aid   Tequin [Gatifloxacin] Swelling and Anxiety   Valium [Diazepam] Other (See Comments)    Dizziness    Vibramycin [Doxycycline] Itching and Nausea Only    Current antibiotics:   MEDICATIONS:  acetylcysteine  4 mL Nebulization BID   apixaban  2.5 mg Oral BID   Chlorhexidine Gluconate Cloth  6 each Topical  Daily   finasteride  5 mg Oral Daily   FLUoxetine  20 mg Oral Daily   guaiFENesin  600 mg Oral BID   mometasone-formoterol  2 puff Inhalation BID   pantoprazole  40 mg Oral q morning   rosuvastatin  5 mg Oral QHS   sodium chloride flush  3 mL Intravenous Q12H   tamsulosin  0.4 mg Oral QHS    Social History   Tobacco Use   Smoking status: Former    Types: Cigarettes    Passive exposure: Yes   Smokeless tobacco: Former    Types: Chew   Tobacco comments:    "quit chewing in the 1960s"    Verified by Daughter, [name]  Vaping Use   Vaping status: Never Used  Substance Use Topics   Alcohol use: No   Drug use: No    Family History  Problem Relation Age of Onset   Arthritis-Osteo Sister    Heart attack Brother    Heart attack Other    Cancer Neg Hx     Review of Systems -   12 point ros is negative except what is mentioned in hpi OBJECTIVE: Temp:  [95.8 F (35.4 C)-99.5 F (37.5 C)] 99.5 F (37.5 C) (12/07 0817) Pulse Rate:  [70-92] 76 (12/07 0817) Resp:  [16-18] 18 (12/07 0817) BP: (128-134)/(61-71) 134/71 (12/07 0817) SpO2:  [97 %-99 %] 99 % (12/07 0817) Physical Exam  Constitutional: He is oriented to person, only. He appears well-developed and well-nourished. No distress.  HENT:  Mouth/Throat: Oropharynx is clear and moist. No oropharyngeal exudate.  Cardiovascular: Normal rate, regular rhythm and normal heart sounds. Exam reveals no gallop and no friction rub.  No murmur heard.  Pulmonary/Chest: Effort normal and breath sounds normal. No respiratory distress. He has no wheezes.  Decrease BS right lower base Abdominal: Soft. Bowel sounds are normal. He exhibits no distension. There is no tenderness.  Lymphadenopathy:  He has no cervical adenopathy.  Neurological: He is alert and oriented to person,only.  Skin: Skin is warm and dry. No rash noted. No erythema.  Psychiatric: He has a normal mood and affect. His behavior is normal.    LABS: Results  for orders placed or performed during the hospital encounter of 03/08/23 (from the past 48 hour(s))  Magnesium     Status: None   Collection Time: 03/10/23  5:40 AM  Result Value Ref Range   Magnesium 1.7 1.7 - 2.4 mg/dL    Comment: Performed at [facility], [address] [place], [place] [address]  Basic metabolic panel     Status: Abnormal   Collection Time: 03/10/23  5:41 AM  Result Value Ref Range   Sodium 130 (L) 135 - 145 mmol/L   Potassium 2.9 (L) 3.5 - 5.1 mmol/L   Chloride 104 98 - 111 mmol/L  CO2 17 (L) 22 - 32 mmol/L   Glucose, Bld 105 (H) 70 - 99 mg/dL    Comment: Glucose reference range applies only to samples taken after fasting for at least 8 hours.   BUN 32 (H) 8 - 23 mg/dL   Creatinine, Ser 1.19 (H) 0.61 - 1.24 mg/dL   Calcium 8.3 (L) 8.9 - 10.3 mg/dL   GFR, Estimated 46 (L) >60 mL/min    Comment: (NOTE) Calculated using the CKD-EPI Creatinine Equation (2021)    Anion gap 9 5 - 15    Comment: Performed at [facility], [address] [place], [place] [address]  CBC     Status: Abnormal   Collection Time: 03/10/23  5:41 AM  Result Value Ref Range   WBC 9.0 4.0 - 10.5 K/uL   RBC 2.56 (L) 4.22 - 5.81 MIL/uL   Hemoglobin 8.7 (L) 13.0 - 17.0 g/dL   HCT 95.6 (L) 21.3 - 08.6 %   MCV 99.6 80.0 - 100.0 fL   MCH 34.0 26.0 - 34.0 pg   MCHC 34.1 30.0 - 36.0 g/dL   RDW 57.8 46.9 - 62.9 %   Platelets 66 (L) 150 - 400 K/uL    Comment: Immature Platelet Fraction may be clinically indicated, consider ordering this additional test [id number] REPEATED TO VERIFY    nRBC 0.0 0.0 - 0.2 %    Comment: Performed at [facility], [address], [place], [place] [address]  MRSA Next Gen by PCR, Nasal     Status: Abnormal   Collection Time: 03/10/23  3:18 PM   Specimen: Nasal Mucosa; Nasal Swab  Result Value Ref Range   MRSA by PCR Next Gen DETECTED (A) NOT DETECTED    Comment: RESULT CALLED TO, READ BACK BY AND VERIFIED WITH:  RN Chinita Greenland [phone] @  2000 FH (NOTE) The GeneXpert MRSA Assay (FDA approved for NASAL specimens only), is one component of a comprehensive MRSA colonization surveillance program. It is not intended to diagnose MRSA infection nor to guide or monitor treatment for MRSA infections. Test performance is not FDA approved in patients less than 49 years old. Performed at [facility], [address], [place], [place] [address]   Basic metabolic panel     Status: Abnormal   Collection Time: 03/11/23  5:41 AM  Result Value Ref Range   Sodium 131 (L) 135 - 145 mmol/L   Potassium 4.1 3.5 - 5.1 mmol/L   Chloride 107 98 - 111 mmol/L   CO2 16 (L) 22 - 32 mmol/L   Glucose, Bld 118 (H) 70 - 99 mg/dL    Comment: Glucose reference range applies only to samples taken after fasting for at least 8 hours.   BUN 20 8 - 23 mg/dL   Creatinine, Ser 7.42 (H) 0.61 - 1.24 mg/dL   Calcium 8.3 (L) 8.9 - 10.3 mg/dL   GFR, Estimated 54 (L) >60 mL/min    Comment: (NOTE) Calculated using the CKD-EPI Creatinine Equation (2021)    Anion gap 8 5 - 15    Comment: Performed at [facility], [address], [place], [place] [address]  CBC     Status: Abnormal   Collection Time: 03/11/23  5:41 AM  Result Value Ref Range   WBC 8.1 4.0 - 10.5 K/uL   RBC 2.48 (L) 4.22 - 5.81 MIL/uL   Hemoglobin 8.3 (L) 13.0 - 17.0 g/dL   HCT 87.5 (L) 64.3 - 32.9 %  MCV 99.2 80.0 - 100.0 fL   MCH 33.5 26.0 - 34.0 pg   MCHC 33.7 30.0 - 36.0 g/dL   RDW 60.4 54.0 - 98.1 %   Platelets 65 (L) 150 - 400 K/uL    Comment: Immature Platelet Fraction may be clinically indicated, consider ordering this additional test [id number] REPEATED TO VERIFY    nRBC 0.0 0.0 - 0.2 %    Comment: Performed at [facility], [address], [place], [place] [address]   *Note: Due to a large number of results and/or encounters for the requested time period, some results have not been displayed. A complete set of results can be found in Results Review.     MICRO: Blood cx 12/4 + H.flu MRSA colonized IMAGING: DG Chest Port 1 View  Result Date: 03/11/2023 CLINICAL DATA:  Pneumonia EXAM: PORTABLE CHEST 1 VIEW COMPARISON:  Chest radiograph dated 03/08/2023. FINDINGS: The heart is enlarged. Vascular calcifications are seen in the aortic arch. Moderate bilateral interstitial and airspace opacities appear increased from prior exam. A small right pleural effusion may contribute. No left pleural effusion. No pneumothorax. Degenerative changes are seen in the spine. IMPRESSION: Moderate bilateral interstitial and airspace opacities appear increased from prior exam. Electronically Signed   By: Romona Curls M.D.   On: 03/11/2023 10:48   ECHOCARDIOGRAM COMPLETE  Result Date: 03/10/2023    ECHOCARDIOGRAM REPORT   Patient Name:   Evan [name] Date of Exam: 03/10/2023 Medical Rec #:  [id number]    Height:       63.0 in Accession #:    [id number]   Weight:       143.5 lb Date of Birth:  November 03, 1927    BSA:          1.679 m Patient Age:    89 years     BP:           108/61 mmHg Patient Gender: M            HR:           77 bpm. Exam Location:  Inpatient Procedure: 2D Echo, Cardiac Doppler and Color Doppler Indications:    Cardiomegaly  History:        Patient has prior history of Echocardiogram examinations, most                 recent 04/28/2022. CAD, Prior CABG, Stroke and COPD, Aortic Valve                 Disease, Arrythmias:Atrial Fibrillation,                 Signs/Symptoms:Hypotension, Altered Mental Status and Shortness                 of Breath; Risk Factors:Hypertension and Dyslipidemia. Dementia,                 lung CA.  Sonographer:    Milda Smart Referring Phys: [id number] RAMESH KC  Sonographer Comments: Image acquisition challenging due to patient body habitus. IMPRESSIONS  1. Left ventricular ejection fraction, by estimation, is 60 to 65%. The left ventricle has normal function. The left ventricle has no regional wall motion abnormalities. There is mild  left ventricular hypertrophy. Left ventricular diastolic parameters are indeterminate.  2. Right ventricular systolic function is normal. The right ventricular size is normal. There is moderately elevated pulmonary artery systolic pressure. The estimated right ventricular systolic pressure is 54.8 mmHg.  3. Left atrial size was  severely dilated.  4. Right atrial size was moderately dilated.  5. The mitral valve is normal in structure. Mild mitral valve regurgitation.  6. Tricuspid valve regurgitation is moderate.  7. The aortic valve is tricuspid. Aortic valve regurgitation is trivial. Aortic valve sclerosis/calcification is present, without any evidence of aortic stenosis.  8. The inferior vena cava is normal in size with greater than 50% respiratory variability, suggesting right atrial pressure of 3 mmHg. FINDINGS  Left Ventricle: Left ventricular ejection fraction, by estimation, is 60 to 65%. The left ventricle has normal function. The left ventricle has no regional wall motion abnormalities. The left ventricular internal cavity size was normal in size. There is  mild left ventricular hypertrophy. Abnormal (paradoxical) septal motion consistent with post-operative status. Left ventricular diastolic parameters are indeterminate. Right Ventricle: The right ventricular size is normal. Right ventricular systolic function is normal. There is moderately elevated pulmonary artery systolic pressure. The tricuspid regurgitant velocity is 3.60 m/s, and with an assumed right atrial pressure of 3 mmHg, the estimated right ventricular systolic pressure is 54.8 mmHg. Left Atrium: Left atrial size was severely dilated. Right Atrium: Right atrial size was moderately dilated. Pericardium: There is no evidence of pericardial effusion. Mitral Valve: The mitral valve is normal in structure. Mild mitral valve regurgitation. Tricuspid Valve: Tricuspid valve regurgitation is moderate. Aortic Valve: The aortic valve is tricuspid.  Aortic valve regurgitation is trivial. Aortic valve sclerosis/calcification is present, without any evidence of aortic stenosis. Pulmonic Valve: Pulmonic valve regurgitation is not visualized. Aorta: The aortic root and ascending aorta are structurally normal, with no evidence of dilitation. Venous: The inferior vena cava is normal in size with greater than 50% respiratory variability, suggesting right atrial pressure of 3 mmHg. IAS/Shunts: The interatrial septum was not well visualized.  LEFT VENTRICLE PLAX 2D LVIDd:         4.40 cm   Diastology LVIDs:         3.00 cm   LV e' medial:    4.88 cm/s LV PW:         1.10 cm   LV E/e' medial:  1.0 LV IVS:        1.10 cm   LV e' lateral:   8.73 cm/s LVOT diam:     2.10 cm   LV E/e' lateral: 0.6 LV SV:         44 LV SV Index:   26 LVOT Area:     3.46 cm  RIGHT VENTRICLE            IVC RV S prime:     9.67 cm/s  IVC diam: 2.20 cm TAPSE (M-mode): 1.7 cm LEFT ATRIUM              Index        RIGHT ATRIUM           Index LA diam:        4.10 cm  2.44 cm/m   RA Area:     16.90 cm LA Vol (A2C):   106.0 ml 63.12 ml/m  RA Volume:   44.70 ml  26.62 ml/m LA Vol (A4C):   108.0 ml 64.31 ml/m LA Biplane Vol: 110.0 ml 65.50 ml/m  AORTIC VALVE LVOT Vmax:   55.60 cm/s LVOT Vmean:  40.900 cm/s LVOT VTI:    0.128 m  AORTA Ao Root diam: 3.60 cm Ao Asc diam:  3.70 cm MR Peak grad: 97.2 mmHg   TRICUSPID VALVE MR Vmax:  493.00 cm/s TR Peak grad:   51.8 mmHg MV E velocity: 4.88 cm/s  TR Vmax:        360.00 cm/s                            SHUNTS                           Systemic VTI:  0.13 m                           Systemic Diam: 2.10 cm Carolan Clines Electronically signed by Carolan Clines Signature Date/Time: 03/10/2023/3:18:32 PM    Final    DG Swallowing Func-Speech Pathology  Result Date: 03/10/2023 Table formatting from the original result was not included. Modified Barium Swallow Study Patient Details Name: RYUSEI MAYS MRN: [id number] Date of Birth: 05-Sep-1927 Today's Date:  03/10/2023 HPI/PMH: HPI: 89 y.o. male presenting with shortness of breath and altered mental status. He was exposed to 2 people who were found later to have pneumonia at Thanksgiving.  Now, for the past 2 days patient has had a low-grade fever around 99.5 and has been reporting some shortness of breath as well as orthopnea and oral intake.  He has been coughing when drinking at home and at the hospital.  CT shows right lower lobe consolidation, groundglass opacities and interstitial thickening similar to prior. Prior MBS in 2023 shows sensed aspriation of thin liquids, improved with small sips. Daughter reports pt eats and drinks too fast at home.  Pt with medical history significant of hypertension, hyperlipidemia, GERD, CKD 3, BPH, anemia, atrial fibrillation, subdural hematoma, CVA, CAD status post CABG, QT prolongation, dementia, sundowning, hearing loss, anxiety, depression, chronic respiratory failure with hypoxia, COPD, lung cancer, radiation pneumonitis, rheumatoid arthritis, Per family he is hard of hearing. Clinical Impression: Clinical Impression: Pt demonstrates very functional swallowing ability with mild oral dysphagia characterized by prolonged mastication, lingual rocking with solids, residue in the floor of mouth and residue around teeth with a sticky solid. Pt was able to clear residue with verbal cues. Though boluses spilled to pyriform sinsues prior to laryngeal elevation there was only one instance of trace frank silent penetration with thin liquids. PAS4. Pt did not cough during study as he did in the room. Suspect upright position in the chair was significantly beneficial. Pt was cheerful and participatory though hard of hearing. Recommend continuing nectar thick liquids while in the hospital since positioning is often suboptimal. Told daughter to resume thin liquids and foods of choice (unrestricted regular) with upright posture after return home. Cues for oral clearance helpful. Will report  to daughter. Factors that may increase risk of adverse event in presence of aspiration Rubye Oaks [employer] Clearance Coots 2021): No data recorded Recommendations/Plan: Swallowing Evaluation Recommendations Swallowing Evaluation Recommendations Recommendations: PO diet PO Diet Recommendation: Regular; Thin liquids (Level 0); Mildly thick liquids (Level 2, nectar thick) Liquid Administration via: Cup; Straw Medication Administration: Whole meds with puree Supervision: Staff to assist with self-feeding Swallowing strategies  : Slow rate; Small bites/sips Postural changes: Position pt fully upright for meals Oral care recommendations: Oral care BID (2x/day) Treatment Plan Treatment Plan Treatment recommendations: No treatment recommended at this time Recommendations Recommendations for follow up therapy are one component of a multi-disciplinary discharge planning process, led by the attending physician.  Recommendations may be updated based on patient status, additional functional criteria  and insurance authorization. Assessment: Orofacial Exam: Orofacial Exam Oral Cavity - Dentition: Adequate natural dentition Anatomy: Anatomy: WFL Boluses Administered: Boluses Administered Boluses Administered: Thin liquids (Level 0); Mildly thick liquids (Level 2, nectar thick); Puree; Solid  Oral Impairment Domain: Oral Impairment Domain Lip Closure: No labial escape Tongue control during bolus hold: Not tested Bolus preparation/mastication: Slow prolonged chewing/mashing with complete recollection Bolus transport/lingual motion: Slow tongue motion Oral residue: Residue collection on oral structures Location of oral residue : Floor of mouth Initiation of pharyngeal swallow : Pyriform sinuses  Pharyngeal Impairment Domain: Pharyngeal Impairment Domain Soft palate elevation: No bolus between soft palate (SP)/pharyngeal wall (PW) Laryngeal elevation: Complete superior movement of thyroid cartilage with complete approximation of arytenoids to epiglottic  petiole Anterior hyoid excursion: Complete anterior movement Epiglottic movement: Complete inversion Laryngeal vestibule closure: Complete, no air/contrast in laryngeal vestibule Pharyngeal stripping wave : Present - complete Pharyngeal contraction (A/P view only): N/A Pharyngoesophageal segment opening: Complete distension and complete duration, no obstruction of flow Tongue base retraction: No contrast between tongue base and posterior pharyngeal wall (PPW) Pharyngeal residue: Complete pharyngeal clearance  Esophageal Impairment Domain: No data recorded Pill: No data recorded Penetration/Aspiration Scale Score: Penetration/Aspiration Scale Score 1.  Material does not enter airway: Thin liquids (Level 0); Mildly thick liquids (Level 2, nectar thick); Solid; Puree; Moderately thick liquids (Level 3, honey thick) 5.  Material enters airway, CONTACTS cords and not ejected out: Thin liquids (Level 0) Compensatory Strategies: No data recorded  General Information: Caregiver present: No  Diet Prior to this Study: Regular; Mildly thick liquids (Level 2, nectar thick)   Temperature : Normal   Respiratory Status: WFL   Supplemental O2: None (Room air)   No data recorded Behavior/Cognition: Alert; Cooperative; Pleasant mood Self-Feeding Abilities: Able to self-feed Baseline vocal quality/speech: Normal Volitional Cough: Able to elicit Volitional Swallow: Able to elicit Exam Limitations: No limitations Goal Planning: Prognosis for improved oropharyngeal function: Guarded No data recorded No data recorded No data recorded No data recorded Pain: Pain Assessment Pain Assessment: Faces Faces Pain Scale: 4 Pain Location: RUE Pain Descriptors / Indicators: Aching; Discomfort; Guarding Pain Intervention(s): Monitored during session; Repositioned; Limited activity within patient's tolerance End of Session: Start Time:SLP Start Time (ACUTE ONLY): 1200 Stop Time: SLP Stop Time (ACUTE ONLY): 1220 Time Calculation:SLP Time Calculation  (min) (ACUTE ONLY): 20 min Charges: SLP Evaluations $ SLP Speech Visit: 1 Visit SLP Evaluations $BSS Swallow: 1 Procedure $MBS Swallow: 1 Procedure $Swallowing Treatment: 1 Procedure SLP visit diagnosis: SLP Visit Diagnosis: Dysphagia, oropharyngeal phase (R13.12) Past Medical History: Past Medical History: Diagnosis Date  Acute blood loss anemia   Acute bronchitis 05/15/2017  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 07/14/2018  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  Altered mental status 08/21/2018  Anginal chest pain at rest [facility])   Chronic non, controlled on Lorazepam  Anxiety   Arthritis   "all over"   BPH (benign prostatic hyperplasia)   CAD (coronary artery disease)   Cellulitis of arm, right 06/01/2018  Cellulitis of left axilla   Chronic lower back pain   Complication of anesthesia   "problems making water afterwards"  Depression   DJD (degenerative joint disease)   Esophageal ulcer without bleeding   bid ppi indefinitely  Fall 07/16/2018  Family history of adverse reaction to anesthesia   "children w/PONV"  GERD (gastroesophageal reflux disease)   Headache   "couple/week maybe" (09/04/2015)  Hemochromatosis   Possible, elevated iron stores, hook-like osteophytes on  hand films, normal LFTs  Hepatitis   "yellow jaundice as a baby"  History of ASCVD   MULTIVESSEL  Hyperlipidemia   Hypertension   Hypotension   Mild aortic stenosis 06/23/2021  Osteoarthritis   Pneumonia   "several times; got a little now" (09/04/2015)  Rash and nonspecific skin eruption 06/01/2018  Rheumatoid arthritis (HCC)   RSV (respiratory syncytial virus pneumonia) 05/15/2022  Subdural hematoma (HCC)   small after fall 09/2016-plavix held, neurosurgery consulted.  Asymptomatic.    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 since he failed PO Eliquis Past Surgical History: Past Surgical History: Procedure Laterality Date  ARM SKIN LESION BIOPSY / EXCISION Left  09/02/2015  CATARACT EXTRACTION W/ INTRAOCULAR LENS  IMPLANT, BILATERAL Bilateral   CHOLECYSTECTOMY OPEN    CORNEAL TRANSPLANT Bilateral   "one at [facility]; one at [facility]"  CORONARY ANGIOPLASTY WITH STENT PLACEMENT    CORONARY ARTERY BYPASS GRAFT  1999  DESCENDING AORTIC ANEURYSM REPAIR W/ STENT    DE stent ostium into the right radial free graft at OM-, 02-2007  ESOPHAGOGASTRODUODENOSCOPY N/A 11/09/2014  Procedure: ESOPHAGOGASTRODUODENOSCOPY (EGD);  Surgeon: Dorena Cookey, MD;  Location: [facility] ENDOSCOPY;  Service: Endoscopy;  Laterality: N/A;  INGUINAL HERNIA REPAIR    JOINT REPLACEMENT    LEFT HEART CATH AND CORS/GRAFTS ANGIOGRAPHY N/A 06/27/2017  Procedure: LEFT HEART CATH AND CORS/GRAFTS ANGIOGRAPHY;  Surgeon: Lennette Bihari, MD;  Location: [facility];  Service: Cardiovascular;  Laterality: N/A;  NASAL SINUS SURGERY    SHOULDER OPEN ROTATOR CUFF REPAIR Right   TOTAL KNEE ARTHROPLASTY Bilateral  DeBlois, Riley Nearing 03/10/2023, 1:30 PM   HISTORICAL MICRO/IMAGING  Assessment/Plan:  89yo M with H.influenzae pneumonia with secondary bacteremia - recommend to repeat blood cx to clear bacteremia - continue on ceftriaxone until he is afebrile. Appears to have low grade temp yesterday. Anticipate fever curve to continue to improve. - can change to amox/clav upon discharge - plan for total of 10- 14 days of treatment. 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 And contact isolation.  Aki = slowly improving as well back to his baseline as he improves oral intake  Damond Borchers B. Drue Second MD MPH [facility] [phone]

## 2023-03-11 NOTE — Progress Notes (Addendum)
PROGRESS NOTE KAVIN WEISENBERG  ZOX:[id number] DOB: [birth year] DOA: 03/08/2023 PCP: Donita Brooks, MD  Brief Narrative/Hospital Course: 89 y.o. male with medical history significant of hypertension, hyperlipidemia, GERD, CKD 3, BPH, anemia, atrial fibrillation, subdural hematoma, CVA, CAD status post CABG, QT prolongation, dementia, sundowning, hearing loss, anxiety, depression, chronic respiratory failure with hypoxia, COPD, lung cancer, radiation pneumonitis, rheumatoid arthritis, PE presenting with shortness of breath and altered mental status.  Per family he is hard of hearing, baseline dementia and was exposed to 2 people who were found later to have pneumonia at Thanksgiving.  Now, for the past 2 days patient has had a low-grade fever around 99.5 and has been reporting some shortness of breath as well as orthopnea and oral intake.  He has baseline tremors and has been worse past 2 days. In ED  BP- 110s to 130s systolic, respiratory rate in the 20s to 30s.  Labs>> mild hypokalemia creatinine stable at 1.53, albumin 3.2, T. bili 2.6. Leukocytosis 20.6, hemoglobin stable at 10.4, platelets stable at 80.  BNP elevated to 847.  Lactic acid borderline at 2.1 and repeat  1.5 RVP panel for flu COVID and RSV negative. Blood cultures sent CXR>>possible recurrent airspace disease suspicious for pneumonia and chronic L1 compression fracture noted. CTA PE>> no acute PE, small chronic segmental PE stable from prior CTA, right lower lobe consolidation, groundglass opacities and interstitial thickening similar to prior CT Patient is admitted for further management    Subjective:  Patient seen and examined  Alert awake resting comfortably with baseline dementia  Daughter arrived subsequently -complains of bladder discomfort but has Foley in place Overnight afebrile BP stable Labs shows improving creatinine Assessment and Plan: Principal Problem:   PNA (pneumonia) Active Problems:   Chronic respiratory  failure, unsp w hypoxia or hypercapnia (HCC)   History of pulmonary embolism   COPD (chronic obstructive pulmonary disease) (HCC)   CKD (chronic kidney disease), stage III (HCC)   Essential hypertension   Benign prostatic hyperplasia without lower urinary tract symptoms   Hyperlipidemia, unspecified   S/P CABG (coronary artery bypass graft)   Pulmonary embolism (HCC)   Permanent atrial fibrillation (HCC)   Gastro-esophageal reflux disease without esophagitis   Asthma, chronic   CAD (coronary artery disease)   GAD (generalized anxiety disorder)   Rheumatoid arthritis, unspecified (HCC)   Radiation pneumonitis (HCC)   Personal history of other venous thrombosis and embolism   Thalamic hemorrhage (HCC)   History of lung cancer   Depression   Hemiparesis due to old stroke (HCC)   Presence of coronary angioplasty implant and graft   Dementia without behavioral disturbance (HCC)   Anemia   Haemophilus influenza sepsis POA Pneumonia Has background history of chronic respiratory failure?(Not on oxygen), history of lung cancer, COPD, radiation pneumonitis, Chronic PE noticed but unchanged now with pneumonia> has Hemophilus bacteremia in BCID likely translocation from pneumonia-discussed with pharmacy, changed antibiotic to ceftriaxone, follow-up C/S. Echo obtained no acute finding currently afebrile leukocytosis resolved-msg sent to Dr Drue Second for antibiotic recommendation- she is repeating Blood culture and advised to continue ceftriaxone 2gm iv no, can switch to amox/clav when ready for discharge-total duration of treatment 2 weeks Continue modified diet per SLP, continue antibiotics Recent Labs  Lab 03/08/23 1033 03/08/23 1313 03/08/23 1607 03/09/23 0659 03/10/23 0541 03/11/23 0541  WBC 20.6*  --   --  12.6* 9.0 8.1  LATICACIDVEN  --  2.1* 1.5  --   --   --  History of lung cancer status post radiation History of radiation pneumonitis  COPD versus asthma History of obstructive  lung disease in the chart, cont home Symbicort    Hypertension BP is controlled.He was hypotensive 12/4 so continue to hold lasix metoprolol  Chronic HFmrEF: BNP 847 on admission previously in 160s to 550s, echo from July 2020 was 40-50-55%.  On Lasix 40 mg at home and holding for now-CT scan on admission shows groundglass opacities and possible mild CHF/chronic interstitial lung disease-repeat echo unremarkable with normal EF, resume Lasix today   Hyperlipidemia Cont rosuvastatin   GERD Cont ppi   CKD 3b Mild metabolic acidosis from ckd: B/l creat ~ 1.3- 1.5 -creatinine nicely improved to 1.2 baseline with metabolic acidosis add sodium bicarb resume home Lasix and monitor   Hypokalemia: Resolved Recent Labs    06/28/22 0935 09/27/22 1731 09/28/22 0230 09/29/22 0012 10/14/22 0930 11/05/22 1312 03/08/23 1130 03/08/23 1152 03/09/23 0659 03/10/23 0541 03/11/23 0541  BUN 27* 29* 32* 35* 24 28* 24* 27* 30* 32* 20  CREATININE 1.17 1.29* 1.55* 1.30* 1.51* 1.28* 1.53* 1.50* 1.50* 1.41* 1.25*  CO2 27 22 20* 21* 18* 24 18*  --  20* 17* 16*  K 3.8 3.7 3.3* 4.0 4.0 3.9 3.4* 3.5 3.4* 2.9* 4.1    BPH With chronic Foley in place since March 2023 Continue Foley had failed voiding trial before continue Flomax finasteride   Chronic anemia Hb stable.  Continue to trend  Chronic thrombocytopenia: Platelet count as low as 42 in August, monitor closely while on anticoagulation, now In 60s.  History of DVT/PE: Continue  Eliquis   Paroxysmal atrial fibrillation: Likely paroxysmal, currently in sinus rhythm continue Eliquis holding metoprolol  History of CVA: Continue home rosuvastatin, Eliquis   CAD w/p CABG: Cont rosuvastatin, Eliquis QTc prolongation Monitor   Anxiety Depression Dementia w/ short-term memory issue Per chart has had some history of sundowning in the past.  Does have difficulty hearing which could contribute. Currently mentation much better and improving  provide supportive care delirium precaution   Goals of care: Discussed POC with patient and his daughter currently staying full code refusing palliative care evaluation at this time-stating was seen previously in March, she understand he is high risk for decompensation and readmission.  Prognosis does not appear bright  DVT prophylaxis: apixaban (ELIQUIS) tablet 2.5 mg Start: 03/08/23 2200 Code Status:   Code Status: Full Code Family Communication: plan of care discussed with patient and his daughter  at bedside. Patient status is:  admitted as observation but remains hospitalized for ongoing  because of pneumonia Level of care: Telemetry Medical   Dispo: The patient is from: home            Anticipated disposition: TBD Objective: Vitals last 24 hrs: Vitals:   03/10/23 2015 03/10/23 2200 03/11/23 0505 03/11/23 0817  BP: 133/61  128/68 134/71  Pulse: 92 82 77 76  Resp: 18 18 18 18   Temp: (!) 95.8 F (35.4 C)  98.1 F (36.7 C) 99.5 F (37.5 C)  TempSrc:   Oral Oral  SpO2: 98% 97% 98% 99%  Weight:       Weight change:   Physical Examination:  General exam: alert awake, oriented  to self family HEENT:Oral mucosa moist, Ear/Nose WNL grossly Respiratory system: Bilaterally clear BS,no use of accessory muscle Cardiovascular system: S1 & S2 +, No JVD. Gastrointestinal system: Abdomen soft,NT,ND, BS+ Nervous System: Alert, awake, moving all extremities,and following commands. Extremities: LE edema neg,distal peripheral pulses  palpable and warm.  Skin: No rashes,no icterus. MSK: Normal muscle bulk,tone, power Foley +  Medications reviewed:  Scheduled Meds:  acetylcysteine  4 mL Nebulization BID   apixaban  2.5 mg Oral BID   Chlorhexidine Gluconate Cloth  6 each Topical Daily   finasteride  5 mg Oral Daily   FLUoxetine  20 mg Oral Daily   guaiFENesin  600 mg Oral BID   mometasone-formoterol  2 puff Inhalation BID   pantoprazole  40 mg Oral q morning   rosuvastatin  5 mg Oral  QHS   sodium chloride flush  3 mL Intravenous Q12H   tamsulosin  0.4 mg Oral QHS   Continuous Infusions:  cefTRIAXone (ROCEPHIN)  IV     Diet Order             Diet Heart Room service appropriate? Yes; Fluid consistency: Nectar Thick  Diet effective now                   Intake/Output Summary (Last 24 hours) at 03/11/2023 0856 Last data filed at 03/11/2023 0500 Gross per 24 hour  Intake 1590.17 ml  Output 450 ml  Net 1140.17 ml   Net IO Since Admission: 680.17 mL [03/11/23 0856]  Wt Readings from Last 3 Encounters:  03/10/23 65.1 kg  12/23/22 65.8 kg  11/05/22 66.2 kg     Unresulted Labs (From admission, onward)     Start     Ordered   03/10/23 0500  Basic metabolic panel  Daily,   R     Question:  Specimen collection method  Answer:  Lab=Lab collect   03/09/23 1126   03/10/23 0500  CBC  Daily,   R     Question:  Specimen collection method  Answer:  Lab=Lab collect   03/09/23 1126          Data Reviewed: I have personally reviewed following labs and imaging studies CBC: Recent Labs  Lab 03/08/23 1033 03/08/23 1152 03/09/23 0659 03/10/23 0541 03/11/23 0541  WBC 20.6*  --  12.6* 9.0 8.1  NEUTROABS 17.8*  --   --   --   --   HGB 10.4* 10.5* 9.2* 8.7* 8.3*  HCT 32.2* 31.0* 27.2* 25.5* 24.6*  MCV 103.2*  --  100.0 99.6 99.2  PLT 80*  --  67* 66* 65*   Basic Metabolic Panel:  Recent Labs  Lab 03/08/23 1130 03/08/23 1152 03/09/23 0659 03/10/23 0540 03/10/23 0541 03/11/23 0541  NA 135 136 135  --  130* 131*  K 3.4* 3.5 3.4*  --  2.9* 4.1  CL 104 104 105  --  104 107  CO2 18*  --  20*  --  17* 16*  GLUCOSE 119* 116* 111*  --  105* 118*  BUN 24* 27* 30*  --  32* 20  CREATININE 1.53* 1.50* 1.50*  --  1.41* 1.25*  CALCIUM 9.1  --  8.6*  --  8.3* 8.3*  MG 1.7  --   --  1.7  --   --    GFR: Estimated Creatinine Clearance: 29.7 mL/min (A) (by C-G formula based on SCr of 1.25 mg/dL (H)). Liver Function Tests:  Recent Labs  Lab 03/08/23 1130  03/09/23 0659  AST 18 19  ALT 10 11  ALKPHOS 72 64  BILITOT 2.6* 1.5*  PROT 6.9 5.8*  ALBUMIN 3.2* 2.6*   Recent Labs  Lab 03/08/23 1030  GLUCAP 127*  sepsis Labs: Recent Labs  Lab 03/08/23 1313  03/08/23 1607  LATICACIDVEN 2.1* 1.5    Recent Results (from the past 240 hour(s))  Resp panel by RT-PCR (RSV, Flu A&B, Covid) Anterior Nasal Swab     Status: None   Collection Time: 03/08/23 10:33 AM   Specimen: Anterior Nasal Swab  Result Value Ref Range Status   SARS Coronavirus 2 by RT PCR NEGATIVE NEGATIVE Final   Influenza A by PCR NEGATIVE NEGATIVE Final   Influenza B by PCR NEGATIVE NEGATIVE Final    Comment: (NOTE) The Xpert Xpress SARS-CoV-2/FLU/RSV plus assay is intended as an aid in the diagnosis of influenza from Nasopharyngeal swab specimens and should not be used as a sole basis for treatment. Nasal washings and aspirates are unacceptable for Xpert Xpress SARS-CoV-2/FLU/RSV testing.  Fact Sheet for Patients: BloggerCourse.com  Fact Sheet for Healthcare Providers: SeriousBroker.it  This test is not yet approved or cleared by the [place] FDA and has been authorized for detection and/or diagnosis of SARS-CoV-2 by FDA under an Emergency Use Authorization (EUA). This EUA will remain in effect (meaning this test can be used) for the duration of the COVID-19 declaration under Section 564(b)(1) of the Act, 21 U.S.C. section 360bbb-3(b)(1), unless the authorization is terminated or revoked.     Resp Syncytial Virus by PCR NEGATIVE NEGATIVE Final    Comment: (NOTE) Fact Sheet for Patients: BloggerCourse.com  Fact Sheet for Healthcare Providers: SeriousBroker.it  This test is not yet approved or cleared by the [place] FDA and has been authorized for detection and/or diagnosis of SARS-CoV-2 by FDA under an Emergency Use Authorization (EUA). This EUA will  remain in effect (meaning this test can be used) for the duration of the COVID-19 declaration under Section 564(b)(1) of the Act, 21 U.S.C. section 360bbb-3(b)(1), unless the authorization is terminated or revoked.  Performed at [facility], [address], [place], [place] [address]   Culture, blood (routine x 2)     Status: Abnormal (Preliminary result)   Collection Time: 03/08/23 10:45 AM   Specimen: BLOOD RIGHT FOREARM  Result Value Ref Range Status   Specimen Description BLOOD RIGHT FOREARM  Final   Special Requests   Final    BOTTLES DRAWN AEROBIC AND ANAEROBIC Blood Culture results may not be optimal due to an inadequate volume of blood received in culture bottles   Culture  Setup Time   Final    GRAM NEGATIVE RODS IN BOTH AEROBIC AND ANAEROBIC BOTTLES CRITICAL RESULT CALLED TO, READ BACK BY AND VERIFIED WITH: PHARMD MADISON OWEN ON 03/09/23 @ 1751 BY DRT    Culture (A)  Final    HAEMOPHILUS INFLUENZAE BETA LACTAMASE NEGATIVE HEALTH DEPARTMENT NOTIFIED Performed at [facility], [address] [place], [place] [address]    Report Status PENDING  Incomplete  Blood Culture ID Panel (Reflexed)     Status: Abnormal   Collection Time: 03/08/23 10:45 AM  Result Value Ref Range Status   Enterococcus faecalis NOT DETECTED NOT DETECTED Final   Enterococcus Faecium NOT DETECTED NOT DETECTED Final   Listeria monocytogenes NOT DETECTED NOT DETECTED Final   Staphylococcus species NOT DETECTED NOT DETECTED Final   Staphylococcus aureus (BCID) NOT DETECTED NOT DETECTED Final   Staphylococcus epidermidis NOT DETECTED NOT DETECTED Final   Staphylococcus lugdunensis NOT DETECTED NOT DETECTED Final   Streptococcus species NOT DETECTED NOT DETECTED Final   Streptococcus agalactiae NOT DETECTED NOT DETECTED Final   Streptococcus pneumoniae NOT DETECTED NOT DETECTED Final   Streptococcus pyogenes NOT DETECTED NOT  DETECTED Final   A.calcoaceticus-baumannii NOT DETECTED NOT  DETECTED Final   Bacteroides fragilis NOT DETECTED NOT DETECTED Final   Enterobacterales NOT DETECTED NOT DETECTED Final   Enterobacter cloacae complex NOT DETECTED NOT DETECTED Final   Escherichia coli NOT DETECTED NOT DETECTED Final   Klebsiella aerogenes NOT DETECTED NOT DETECTED Final   Klebsiella oxytoca NOT DETECTED NOT DETECTED Final   Klebsiella pneumoniae NOT DETECTED NOT DETECTED Final   Proteus species NOT DETECTED NOT DETECTED Final   Salmonella species NOT DETECTED NOT DETECTED Final   Serratia marcescens NOT DETECTED NOT DETECTED Final   Haemophilus influenzae DETECTED (A) NOT DETECTED Final    Comment: CRITICAL RESULT CALLED TO, READ BACK BY AND VERIFIED WITH: PHARMD MADISON OWEN ON 03/09/23 @ 1751 BY DRT    Neisseria meningitidis NOT DETECTED NOT DETECTED Final   Pseudomonas aeruginosa NOT DETECTED NOT DETECTED Final   Stenotrophomonas maltophilia NOT DETECTED NOT DETECTED Final   Candida albicans NOT DETECTED NOT DETECTED Final   Candida auris NOT DETECTED NOT DETECTED Final   Candida glabrata NOT DETECTED NOT DETECTED Final   Candida krusei NOT DETECTED NOT DETECTED Final   Candida parapsilosis NOT DETECTED NOT DETECTED Final   Candida tropicalis NOT DETECTED NOT DETECTED Final   Cryptococcus neoformans/gattii NOT DETECTED NOT DETECTED Final    Comment: Performed at [facility], [address], [place], [place] [address]  Culture, blood (routine x 2)     Status: Abnormal (Preliminary result)   Collection Time: 03/08/23 10:50 AM   Specimen: BLOOD  Result Value Ref Range Status   Specimen Description BLOOD RIGHT ANTECUBITAL  Final   Special Requests   Final    BOTTLES DRAWN AEROBIC AND ANAEROBIC Blood Culture results may not be optimal due to an inadequate volume of blood received in culture bottles   Culture  Setup Time   Final    GRAM NEGATIVE RODS IN BOTH AEROBIC AND ANAEROBIC BOTTLES CRITICAL VALUE NOTED.  VALUE IS CONSISTENT WITH PREVIOUSLY REPORTED AND  CALLED VALUE.    Culture (A)  Final    HAEMOPHILUS INFLUENZAE BETA LACTAMASE NEGATIVE Performed at [facility], [address] [place], [place] [address]    Report Status PENDING  Incomplete  MRSA Next Gen by PCR, Nasal     Status: Abnormal   Collection Time: 03/10/23  3:18 PM   Specimen: Nasal Mucosa; Nasal Swab  Result Value Ref Range Status   MRSA by PCR Next Gen DETECTED (A) NOT DETECTED Final    Comment: RESULT CALLED TO, READ BACK BY AND VERIFIED WITH:  RN Chinita Greenland [phone] @ 2000 FH (NOTE) The GeneXpert MRSA Assay (FDA approved for NASAL specimens only), is one component of a comprehensive MRSA colonization surveillance program. It is not intended to diagnose MRSA infection nor to guide or monitor treatment for MRSA infections. Test performance is not FDA approved in patients less than 64 years old. Performed at [facility], [address], [place], [place] [address]     Antimicrobials: Anti-infectives (From admission, onward)    Start     Dose/Rate Route Frequency Ordered Stop   03/11/23 1000  cefTRIAXone (ROCEPHIN) 2 g in sodium chloride 0.9 % 100 mL IVPB        2 g 200 mL/hr over 30 Minutes Intravenous Every 24 hours 03/10/23 1026     03/10/23 1115  cefTRIAXone (ROCEPHIN) 1 g in sodium chloride 0.9 % 100 mL IVPB  1 g 200 mL/hr over 30 Minutes Intravenous  Once 03/10/23 1026 03/10/23 1215   03/09/23 1000  cefTRIAXone (ROCEPHIN) 1 g in sodium chloride 0.9 % 100 mL IVPB  Status:  Discontinued        1 g 200 mL/hr over 30 Minutes Intravenous Every 24 hours 03/08/23 1556 03/08/23 1652   03/09/23 1000  cefTRIAXone (ROCEPHIN) 1 g in sodium chloride 0.9 % 100 mL IVPB  Status:  Discontinued        1 g 200 mL/hr over 30 Minutes Intravenous Every 24 hours 03/08/23 1652 03/10/23 1026   03/08/23 2200  doxycycline (VIBRA-TABS) tablet 100 mg  Status:  Discontinued        100 mg Oral Every 12 hours 03/08/23 1556 03/10/23 0814   03/08/23 1400  cefTRIAXone  (ROCEPHIN) 1 g in sodium chloride 0.9 % 100 mL IVPB        1 g 200 mL/hr over 30 Minutes Intravenous  Once 03/08/23 1346 03/08/23 1507   03/08/23 1400  doxycycline (VIBRA-TABS) tablet 100 mg        100 mg Oral  Once 03/08/23 1346 03/08/23 1435      Culture/Microbiology    Component Value Date/Time   SDES BLOOD RIGHT ANTECUBITAL 03/08/2023 1050   SPECREQUEST  03/08/2023 1050    BOTTLES DRAWN AEROBIC AND ANAEROBIC Blood Culture results may not be optimal due to an inadequate volume of blood received in culture bottles   CULT (A) 03/08/2023 1050    HAEMOPHILUS INFLUENZAE BETA LACTAMASE NEGATIVE Performed at [facility], [address] [place], [place] [address]    REPTSTATUS PENDING 03/08/2023 1050    Radiology Studies: ECHOCARDIOGRAM COMPLETE  Result Date: 03/10/2023    ECHOCARDIOGRAM REPORT   Patient Name:   JATNIEL KOKER Date of Exam: 03/10/2023 Medical Rec #:  [id number]    Height:       63.0 in Accession #:    [id number]   Weight:       143.5 lb Date of Birth:  1927-04-16    BSA:          1.679 m Patient Age:    89 years     BP:           108/61 mmHg Patient Gender: M            HR:           77 bpm. Exam Location:  Inpatient Procedure: 2D Echo, Cardiac Doppler and Color Doppler Indications:    Cardiomegaly  History:        Patient has prior history of Echocardiogram examinations, most                 recent 04/28/2022. CAD, Prior CABG, Stroke and COPD, Aortic Valve                 Disease, Arrythmias:Atrial Fibrillation,                 Signs/Symptoms:Hypotension, Altered Mental Status and Shortness                 of Breath; Risk Factors:Hypertension and Dyslipidemia. Dementia,                 lung CA.  Sonographer:    Milda Smart Referring Phys: [id number] Kensi Karr  Sonographer Comments: Image acquisition challenging due to patient body habitus. IMPRESSIONS  1. Left ventricular ejection fraction, by estimation, is 60 to 65%. The left ventricle has normal function.  The left  ventricle has no regional wall motion abnormalities. There is mild left ventricular hypertrophy. Left ventricular diastolic parameters are indeterminate.  2. Right ventricular systolic function is normal. The right ventricular size is normal. There is moderately elevated pulmonary artery systolic pressure. The estimated right ventricular systolic pressure is 54.8 mmHg.  3. Left atrial size was severely dilated.  4. Right atrial size was moderately dilated.  5. The mitral valve is normal in structure. Mild mitral valve regurgitation.  6. Tricuspid valve regurgitation is moderate.  7. The aortic valve is tricuspid. Aortic valve regurgitation is trivial. Aortic valve sclerosis/calcification is present, without any evidence of aortic stenosis.  8. The inferior vena cava is normal in size with greater than 50% respiratory variability, suggesting right atrial pressure of 3 mmHg. FINDINGS  Left Ventricle: Left ventricular ejection fraction, by estimation, is 60 to 65%. The left ventricle has normal function. The left ventricle has no regional wall motion abnormalities. The left ventricular internal cavity size was normal in size. There is  mild left ventricular hypertrophy. Abnormal (paradoxical) septal motion consistent with post-operative status. Left ventricular diastolic parameters are indeterminate. Right Ventricle: The right ventricular size is normal. Right ventricular systolic function is normal. There is moderately elevated pulmonary artery systolic pressure. The tricuspid regurgitant velocity is 3.60 m/s, and with an assumed right atrial pressure of 3 mmHg, the estimated right ventricular systolic pressure is 54.8 mmHg. Left Atrium: Left atrial size was severely dilated. Right Atrium: Right atrial size was moderately dilated. Pericardium: There is no evidence of pericardial effusion. Mitral Valve: The mitral valve is normal in structure. Mild mitral valve regurgitation. Tricuspid Valve: Tricuspid valve  regurgitation is moderate. Aortic Valve: The aortic valve is tricuspid. Aortic valve regurgitation is trivial. Aortic valve sclerosis/calcification is present, without any evidence of aortic stenosis. Pulmonic Valve: Pulmonic valve regurgitation is not visualized. Aorta: The aortic root and ascending aorta are structurally normal, with no evidence of dilitation. Venous: The inferior vena cava is normal in size with greater than 50% respiratory variability, suggesting right atrial pressure of 3 mmHg. IAS/Shunts: The interatrial septum was not well visualized.  LEFT VENTRICLE PLAX 2D LVIDd:         4.40 cm   Diastology LVIDs:         3.00 cm   LV e' medial:    4.88 cm/s LV PW:         1.10 cm   LV E/e' medial:  1.0 LV IVS:        1.10 cm   LV e' lateral:   8.73 cm/s LVOT diam:     2.10 cm   LV E/e' lateral: 0.6 LV SV:         44 LV SV Index:   26 LVOT Area:     3.46 cm  RIGHT VENTRICLE            IVC RV S prime:     9.67 cm/s  IVC diam: 2.20 cm TAPSE (M-mode): 1.7 cm LEFT ATRIUM              Index        RIGHT ATRIUM           Index LA diam:        4.10 cm  2.44 cm/m   RA Area:     16.90 cm LA Vol (A2C):   106.0 ml 63.12 ml/m  RA Volume:   44.70 ml  26.62 ml/m LA Vol (A4C):   108.0  ml 64.31 ml/m LA Biplane Vol: 110.0 ml 65.50 ml/m  AORTIC VALVE LVOT Vmax:   55.60 cm/s LVOT Vmean:  40.900 cm/s LVOT VTI:    0.128 m  AORTA Ao Root diam: 3.60 cm Ao Asc diam:  3.70 cm MR Peak grad: 97.2 mmHg   TRICUSPID VALVE MR Vmax:      493.00 cm/s TR Peak grad:   51.8 mmHg MV E velocity: 4.88 cm/s  TR Vmax:        360.00 cm/s                            SHUNTS                           Systemic VTI:  0.13 m                           Systemic Diam: 2.10 cm Carolan Clines Electronically signed by Carolan Clines Signature Date/Time: 03/10/2023/3:18:32 PM    Final    DG Swallowing Func-Speech Pathology  Result Date: 03/10/2023 Table formatting from the original result was not included. Modified Barium Swallow Study Patient Details Name:  DERRIKE ASHCRAFT MRN: [id number] Date of Birth: [birth year] Today's Date: 03/10/2023 HPI/PMH: HPI: 89 y.o. male presenting with shortness of breath and altered mental status. He was exposed to 2 people who were found later to have pneumonia at Thanksgiving.  Now, for the past 2 days patient has had a low-grade fever around 99.5 and has been reporting some shortness of breath as well as orthopnea and oral intake.  He has been coughing when drinking at home and at the hospital.  CT shows right lower lobe consolidation, groundglass opacities and interstitial thickening similar to prior. Prior MBS in 2023 shows sensed aspriation of thin liquids, improved with small sips. Daughter reports pt eats and drinks too fast at home.  Pt with medical history significant of hypertension, hyperlipidemia, GERD, CKD 3, BPH, anemia, atrial fibrillation, subdural hematoma, CVA, CAD status post CABG, QT prolongation, dementia, sundowning, hearing loss, anxiety, depression, chronic respiratory failure with hypoxia, COPD, lung cancer, radiation pneumonitis, rheumatoid arthritis, Per family he is hard of hearing. Clinical Impression: Clinical Impression: Pt demonstrates very functional swallowing ability with mild oral dysphagia characterized by prolonged mastication, lingual rocking with solids, residue in the floor of mouth and residue around teeth with a sticky solid. Pt was able to clear residue with verbal cues. Though boluses spilled to pyriform sinsues prior to laryngeal elevation there was only one instance of trace frank silent penetration with thin liquids. PAS4. Pt did not cough during study as he did in the room. Suspect upright position in the chair was significantly beneficial. Pt was cheerful and participatory though hard of hearing. Recommend continuing nectar thick liquids while in the hospital since positioning is often suboptimal. Told daughter to resume thin liquids and foods of choice (unrestricted regular) with upright  posture after return home. Cues for oral clearance helpful. Will report to daughter. Factors that may increase risk of adverse event in presence of aspiration [employer]ance Coots 2021): No data recorded Recommendations/Plan: Swallowing Evaluation Recommendations Swallowing Evaluation Recommendations Recommendations: PO diet PO Diet Recommendation: Regular; Thin liquids (Level 0); Mildly thick liquids (Level 2, nectar thick) Liquid Administration via: Cup; Straw Medication Administration: Whole meds with puree Supervision: Staff to assist with self-feeding Swallowing strategies  : Slow rate; Small  bites/sips Postural changes: Position pt fully upright for meals Oral care recommendations: Oral care BID (2x/day) Treatment Plan Treatment Plan Treatment recommendations: No treatment recommended at this time Recommendations Recommendations for follow up therapy are one component of a multi-disciplinary discharge planning process, led by the attending physician.  Recommendations may be updated based on patient status, additional functional criteria and insurance authorization. Assessment: Orofacial Exam: Orofacial Exam Oral Cavity - Dentition: Adequate natural dentition Anatomy: Anatomy: WFL Boluses Administered: Boluses Administered Boluses Administered: Thin liquids (Level 0); Mildly thick liquids (Level 2, nectar thick); Puree; Solid  Oral Impairment Domain: Oral Impairment Domain Lip Closure: No labial escape Tongue control during bolus hold: Not tested Bolus preparation/mastication: Slow prolonged chewing/mashing with complete recollection Bolus transport/lingual motion: Slow tongue motion Oral residue: Residue collection on oral structures Location of oral residue : Floor of mouth Initiation of pharyngeal swallow : Pyriform sinuses  Pharyngeal Impairment Domain: Pharyngeal Impairment Domain Soft palate elevation: No bolus between soft palate (SP)/pharyngeal wall (PW) Laryngeal elevation: Complete superior movement of  thyroid cartilage with complete approximation of arytenoids to epiglottic petiole Anterior hyoid excursion: Complete anterior movement Epiglottic movement: Complete inversion Laryngeal vestibule closure: Complete, no air/contrast in laryngeal vestibule Pharyngeal stripping wave : Present - complete Pharyngeal contraction (A/P view only): N/A Pharyngoesophageal segment opening: Complete distension and complete duration, no obstruction of flow Tongue base retraction: No contrast between tongue base and posterior pharyngeal wall (PPW) Pharyngeal residue: Complete pharyngeal clearance  Esophageal Impairment Domain: No data recorded Pill: No data recorded Penetration/Aspiration Scale Score: Penetration/Aspiration Scale Score 1.  Material does not enter airway: Thin liquids (Level 0); Mildly thick liquids (Level 2, nectar thick); Solid; Puree; Moderately thick liquids (Level 3, honey thick) 5.  Material enters airway, CONTACTS cords and not ejected out: Thin liquids (Level 0) Compensatory Strategies: No data recorded  General Information: Caregiver present: No  Diet Prior to this Study: Regular; Mildly thick liquids (Level 2, nectar thick)   Temperature : Normal   Respiratory Status: WFL   Supplemental O2: None (Room air)   No data recorded Behavior/Cognition: Alert; Cooperative; Pleasant mood Self-Feeding Abilities: Able to self-feed Baseline vocal quality/speech: Normal Volitional Cough: Able to elicit Volitional Swallow: Able to elicit Exam Limitations: No limitations Goal Planning: Prognosis for improved oropharyngeal function: Guarded No data recorded No data recorded No data recorded No data recorded Pain: Pain Assessment Pain Assessment: Faces Faces Pain Scale: 4 Pain Location: RUE Pain Descriptors / Indicators: Aching; Discomfort; Guarding Pain Intervention(s): Monitored during session; Repositioned; Limited activity within patient's tolerance End of Session: Start Time:SLP Start Time (ACUTE ONLY): 1200 Stop Time:  SLP Stop Time (ACUTE ONLY): 1220 Time Calculation:SLP Time Calculation (min) (ACUTE ONLY): 20 min Charges: SLP Evaluations $ SLP Speech Visit: 1 Visit SLP Evaluations $BSS Swallow: 1 Procedure $MBS Swallow: 1 Procedure $Swallowing Treatment: 1 Procedure SLP visit diagnosis: SLP Visit Diagnosis: Dysphagia, oropharyngeal phase (R13.12) Past Medical History: Past Medical History: Diagnosis Date  Acute blood loss anemia   Acute bronchitis 05/15/2017  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 07/14/2018  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  Altered mental status 08/21/2018  Anginal chest pain at rest [facility])   Chronic non, controlled on Lorazepam  Anxiety   Arthritis   "all over"   BPH (benign prostatic hyperplasia)   CAD (coronary artery disease)   Cellulitis of arm, right 06/01/2018  Cellulitis of left axilla   Chronic lower back pain   Complication of anesthesia   "problems making  water afterwards"  Depression   DJD (degenerative joint disease)   Esophageal ulcer without bleeding   bid ppi indefinitely  Fall 07/16/2018  Family history of adverse reaction to anesthesia   "children w/PONV"  GERD (gastroesophageal reflux disease)   Headache   "couple/week maybe" (09/04/2015)  Hemochromatosis   Possible, elevated iron stores, hook-like osteophytes on hand films, normal LFTs  Hepatitis   "yellow jaundice as a baby"  History of ASCVD   MULTIVESSEL  Hyperlipidemia   Hypertension   Hypotension   Mild aortic stenosis 06/23/2021  Osteoarthritis   Pneumonia   "several times; got a little now" (09/04/2015)  Rash and nonspecific skin eruption 06/01/2018  Rheumatoid arthritis (HCC)   RSV (respiratory syncytial virus pneumonia) 05/15/2022  Subdural hematoma (HCC)   small after fall 09/2016-plavix held, neurosurgery consulted.  Asymptomatic.    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 since he failed PO Eliquis Past Surgical History: Past Surgical History:  Procedure Laterality Date  ARM SKIN LESION BIOPSY / EXCISION Left 09/02/2015  CATARACT EXTRACTION W/ INTRAOCULAR LENS  IMPLANT, BILATERAL Bilateral   CHOLECYSTECTOMY OPEN    CORNEAL TRANSPLANT Bilateral   "one at [facility]; one at [facility]"  CORONARY ANGIOPLASTY WITH STENT PLACEMENT    CORONARY ARTERY BYPASS GRAFT  1999  DESCENDING AORTIC ANEURYSM REPAIR W/ STENT    DE stent ostium into the right radial free graft at OM-, 02-2007  ESOPHAGOGASTRODUODENOSCOPY N/A 11/09/2014  Procedure: ESOPHAGOGASTRODUODENOSCOPY (EGD);  Surgeon: Dorena Cookey, MD;  Location: [facility] ENDOSCOPY;  Service: Endoscopy;  Laterality: N/A;  INGUINAL HERNIA REPAIR    JOINT REPLACEMENT    LEFT HEART CATH AND CORS/GRAFTS ANGIOGRAPHY N/A 06/27/2017  Procedure: LEFT HEART CATH AND CORS/GRAFTS ANGIOGRAPHY;  Surgeon: Lennette Bihari, MD;  Location: [facility] INVASIVE CV LAB;  Service: Cardiovascular;  Laterality: N/A;  NASAL SINUS SURGERY    SHOULDER OPEN ROTATOR CUFF REPAIR Right   TOTAL KNEE ARTHROPLASTY Bilateral  DeBlois, Riley Nearing 03/10/2023, 1:30 PM    LOS: 2 days   Total time spent in review of labs and imaging, patient evaluation, formulation of plan, documentation and communication with family: 35 minutes  Lanae Boast, MD [facility]  03/11/2023, 8:56 AM

## 2023-03-12 ENCOUNTER — Encounter (HOSPITAL_COMMUNITY): Payer: Self-pay | Admitting: Internal Medicine

## 2023-03-12 DIAGNOSIS — A413 Sepsis due to Hemophilus influenzae: Secondary | ICD-10-CM

## 2023-03-12 DIAGNOSIS — J14 Pneumonia due to Hemophilus influenzae: Secondary | ICD-10-CM

## 2023-03-12 DIAGNOSIS — J9610 Chronic respiratory failure, unspecified whether with hypoxia or hypercapnia: Secondary | ICD-10-CM | POA: Diagnosis not present

## 2023-03-12 DIAGNOSIS — N1832 Chronic kidney disease, stage 3b: Secondary | ICD-10-CM

## 2023-03-12 DIAGNOSIS — Z978 Presence of other specified devices: Secondary | ICD-10-CM

## 2023-03-12 DIAGNOSIS — N400 Enlarged prostate without lower urinary tract symptoms: Secondary | ICD-10-CM | POA: Diagnosis not present

## 2023-03-12 DIAGNOSIS — F0390 Unspecified dementia without behavioral disturbance: Secondary | ICD-10-CM

## 2023-03-12 DIAGNOSIS — I10 Essential (primary) hypertension: Secondary | ICD-10-CM

## 2023-03-12 HISTORY — DX: Sepsis due to hemophilus influenzae: A41.3

## 2023-03-12 LAB — BASIC METABOLIC PANEL
Anion gap: 11 (ref 5–15)
BUN: 12 mg/dL (ref 8–23)
CO2: 14 mmol/L — ABNORMAL LOW (ref 22–32)
Calcium: 8.8 mg/dL — ABNORMAL LOW (ref 8.9–10.3)
Chloride: 109 mmol/L (ref 98–111)
Creatinine, Ser: 1.09 mg/dL (ref 0.61–1.24)
GFR, Estimated: >60 mL/min (ref >60)
Glucose, Bld: 112 mg/dL — ABNORMAL HIGH (ref 70–99)
Potassium: 4.1 mmol/L (ref 3.5–5.1)
Sodium: 134 mmol/L — ABNORMAL LOW (ref 135–145)

## 2023-03-12 LAB — CBC
HCT: 27.3 % — ABNORMAL LOW (ref 39.0–52.0)
Hemoglobin: 9.2 g/dL — ABNORMAL LOW (ref 13.0–17.0)
MCH: 33.9 pg (ref 26.0–34.0)
MCHC: 33.7 g/dL (ref 30.0–36.0)
MCV: 100.7 fL — ABNORMAL HIGH (ref 80.0–100.0)
Platelets: 66 10*3/uL — ABNORMAL LOW (ref 150–400)
RBC: 2.71 MIL/uL — ABNORMAL LOW (ref 4.22–5.81)
RDW: 14.3 % (ref 11.5–15.5)
WBC: 8.9 10*3/uL (ref 4.0–10.5)
nRBC: 0 % (ref 0.0–0.2)

## 2023-03-12 MED ORDER — FUROSEMIDE 10 MG/ML IJ SOLN
40.0000 mg | Freq: Once | INTRAMUSCULAR | Status: AC
  Administered 2023-03-12: 40 mg via INTRAVENOUS
  Filled 2023-03-12: qty 4

## 2023-03-12 MED ORDER — OXYCODONE-ACETAMINOPHEN 5-325 MG PO TABS: 1.0000 | ORAL_TABLET | Freq: Once | ORAL | Status: DC

## 2023-03-12 MED ORDER — OXYCODONE-ACETAMINOPHEN 5-325 MG PO TABS
1.0000 | ORAL_TABLET | Freq: Four times a day (QID) | ORAL | Status: DC | PRN
  Administered 2023-03-12 – 2023-03-13 (×2): 1 via ORAL
  Filled 2023-03-12 (×3): qty 1

## 2023-03-12 MED ORDER — SODIUM BICARBONATE 650 MG PO TABS
1300.0000 mg | ORAL_TABLET | Freq: Two times a day (BID) | ORAL | Status: DC
  Administered 2023-03-12 – 2023-03-13 (×2): 1300 mg via ORAL
  Filled 2023-03-12 (×2): qty 2

## 2023-03-12 MED ORDER — MELATONIN 3 MG PO TABS
3.0000 mg | ORAL_TABLET | Freq: Every evening | ORAL | Status: DC | PRN
  Administered 2023-03-12: 3 mg via ORAL
  Filled 2023-03-12: qty 1

## 2023-03-12 NOTE — Assessment & Plan Note (Signed)
03-12-2023 continue with prozac 20 mg qday.

## 2023-03-12 NOTE — Assessment & Plan Note (Signed)
03-12-2023 sacral pressure stage 1. Identified present on admission. Seen nursing notes.

## 2023-03-12 NOTE — Progress Notes (Addendum)
Insomnia: Patient is complaining about difficult to sleep.  Ordered melatonin  Pressure ulcer of the buttock with associated pain: Now patient is complaining about pain in his buttock and back no relieving with Tylenol.  Pain is 6 out of 10 intensity.   Patient's nurse reported that patient has some stool incontinence with moisture associated breakthrough of the bilateral buttock and applying  Mepilex and barrier cream on his bottom and also turning patient every 2 hours.  I believe patient has buttock pain from the pressure ulcer. - Continue management as above. - Continue Percocet every 6 as needed for severe pain.   Tereasa Coop, MD [facility] 03/12/2023, 2:49 AM

## 2023-03-12 NOTE — Plan of Care (Signed)

## 2023-03-12 NOTE — Assessment & Plan Note (Signed)
03-08-2023 thru 03-11-2023 BP is controlled.He was hypotensive 12/4 so continue to hold lasix metoprolol   03-12-2023 BP stable.off HTN meds

## 2023-03-12 NOTE — Progress Notes (Signed)
PROGRESS NOTE    Evan [name]  WUJ:[id number] DOB: February 12, 1928 DOA: 03/08/2023 PCP: Donita Brooks, MD  Subjective: Pt seen and examined. Pt's son jerry at bedside Pt lives with son Dorene Sorrow. Pt only able to walk about 2 times per day in their home. Uses WC most of the time. Pt c/o right ear pain.  On IV rocephin for haemophilus pneumonia.   Hospital Course: HPI: 89 y.o. male with medical history significant of hypertension, hyperlipidemia, GERD, CKD 3, BPH, anemia, atrial fibrillation, subdural hematoma, CVA, CAD status post CABG, QT prolongation, dementia, sundowning, hearing loss, anxiety, depression, chronic respiratory failure with hypoxia, COPD, lung cancer, radiation pneumonitis, rheumatoid arthritis, PE presenting with shortness of breath and altered mental status.  Per family he is hard of hearing, baseline dementia and was exposed to 2 people who were found later to have pneumonia at Thanksgiving.  Now, for the past 2 days patient has had a low-grade fever around 99.5 and has been reporting some shortness of breath as well as orthopnea and oral intake.  He has baseline tremors and has been worse past 2 days. In ED  BP- 110s to 130s systolic, respiratory rate in the 20s to 30s.  Labs>> mild hypokalemia creatinine stable at 1.53, albumin 3.2, T. bili 2.6. Leukocytosis 20.6, hemoglobin stable at 10.4, platelets stable at 80.  BNP elevated to 847.  Lactic acid borderline at 2.1 and repeat  1.5 RVP panel for flu COVID and RSV negative. Blood cultures sent CXR>>possible recurrent airspace disease suspicious for pneumonia and chronic L1 compression fracture noted. CTA PE>> no acute PE, small chronic segmental PE stable from prior CTA, right lower lobe consolidation, groundglass opacities and interstitial thickening similar to prior CT Patient is admitted for further management  Significant Events: Admitted 03/08/2023 for pneumonia   Significant Labs: Admission WBC 20.6, HgB 10.4, Plt  80, Na 135, K 3.4, Bicarb 18, BUN 24, Scr 1.53 03-08-2023 Blood cx positive for Haemophilus influenzae  Significant Imaging Studies: Admission CTPA shows No evidence of an acute pulmonary embolism. Small chronic segmental pulmonary embolism to the posterior basilar segment of the right lower lobe stable from the prior CTA. 2. Right lower lobe consolidation consistent with pneumonia. 3. Other ground-glass lung opacities and interstitial thickening are similar to the prior CT. Consider underlying mild congestive heart failure with pulmonary edema as well as chronic interstitial lung disease in the differential. 4. Stable mild mediastinal right hilar lymphadenopathy, presumed reactive. 5. Stable mild cardiomegaly, stable changes from prior CABG surgery, coronary artery calcifications and aortic atherosclerosis. 03-10-2023 MBS shows Pt demonstrates very functional swallowing ability with mild oral dysphagia characterized by prolonged mastication, lingual rocking with solids, residue in the floor of mouth and residue around teeth with a sticky solid  03-10-2023 echo shows LVEF 60%  Antibiotic Therapy: Anti-infectives (From admission, onward)    Start     Dose/Rate Route Frequency Ordered Stop   03/11/23 1000  cefTRIAXone (ROCEPHIN) 2 g in sodium chloride 0.9 % 100 mL IVPB        2 g 200 mL/hr over 30 Minutes Intravenous Every 24 hours 03/10/23 1026     03/10/23 1115  cefTRIAXone (ROCEPHIN) 1 g in sodium chloride 0.9 % 100 mL IVPB        1 g 200 mL/hr over 30 Minutes Intravenous  Once 03/10/23 1026 03/10/23 1215   03/09/23 1000  cefTRIAXone (ROCEPHIN) 1 g in sodium chloride 0.9 % 100 mL IVPB  Status:  Discontinued  1 g 200 mL/hr over 30 Minutes Intravenous Every 24 hours 03/08/23 1556 03/08/23 1652   03/09/23 1000  cefTRIAXone (ROCEPHIN) 1 g in sodium chloride 0.9 % 100 mL IVPB  Status:  Discontinued        1 g 200 mL/hr over 30 Minutes Intravenous Every 24 hours 03/08/23 1652 03/10/23 1026    03/08/23 2200  doxycycline (VIBRA-TABS) tablet 100 mg  Status:  Discontinued        100 mg Oral Every 12 hours 03/08/23 1556 03/10/23 0814   03/08/23 1400  cefTRIAXone (ROCEPHIN) 1 g in sodium chloride 0.9 % 100 mL IVPB        1 g 200 mL/hr over 30 Minutes Intravenous  Once 03/08/23 1346 03/08/23 1507   03/08/23 1400  doxycycline (VIBRA-TABS) tablet 100 mg        100 mg Oral  Once 03/08/23 1346 03/08/23 1435       Procedures:   Consultants: ID    Assessment and Plan: * PNA (pneumonia) 03-08-2023 thru 03-11-2023 Has background history of chronic respiratory failure?(Not on oxygen), history of lung cancer, COPD, radiation pneumonitis, Chronic PE noticed but unchanged now with pneumonia> has Hemophilus bacteremia in BCID likely translocation from pneumonia-discussed with pharmacy, changed antibiotic to ceftriaxone, follow-up C/S. Echo obtained no acute finding currently afebrile leukocytosis resolved-msg sent to Dr Drue Second for antibiotic recommendation- she is repeating Blood culture and advised to continue ceftriaxone 2gm iv qd,  an switch to amox/clav when ready for discharge-total duration of treatment 2 weeks. Continue modified diet per SLP, continue antibiotics 03-12-2023 remains on IV rocephin. Switch to augmentin at discharge. Awaiting PT consult. Pt's son Dorene Sorrow says that pt was able to walk twice a day in their home. Mostly uses WC for longer distances.  Haemophilus influenzae septicemia (HCC) 03-12-2023 ID consulted. Repeat blood cx drawn. Echo negative.  Anemia 03-12-2023 stable.  Iron/TIBC/Ferritin/ %Sat    Component Value Date/Time   IRON 71 09/27/2022 2018   TIBC 262 09/27/2022 2018   FERRITIN 88 09/27/2022 2018   IRONPCTSAT 27 09/27/2022 2018     Dementia without behavioral disturbance (HCC) 03-12-2023 chronic.  History of pulmonary embolism 03-12-2023 small chronic PE seen on initial CTPA on admission. On eliquis 2.5 mg bid.  Chronic respiratory failure, unsp w hypoxia  or hypercapnia (HCC) 03-12-2023 stable. Unclear if he is on chronic O2 at home. Currently on RA here in hospital.  Hemiparesis due to old stroke (HCC) 03-12-2023 chronic.  Pressure ulcer of back 03-12-2023 sacral pressure stage 1. Identified present on admission. Seen nursing notes.  COPD (chronic obstructive pulmonary disease) (HCC) 03-12-2023 stable. Hx of radiation pneumonitis. Stable. Continue with nebs and inhalers.  CKD stage 3b, GFR 30-44 ml/min (HCC) - baseline SCr 1.2-1.5 03-08-2023 thru 03-12-2023 B/l creat ~ 1.3- 1.5 -creatinine nicely improved to 1.2 baseline with metabolic acidosis add sodium bicarb resume home Lasix and monitor  03-12-2023 stable.   HFmrEF (heart failure with mildly reduced ejection fraction) (HCC) 03-08-2023 thru 03-11-2023 BNP 847 on admission previously in 160s to 550s, echo from July 2020 was 40-50-55%.  On Lasix 40 mg at home and holding for now-CT scan on admission shows groundglass opacities and possible mild CHF/chronic interstitial lung disease-repeat echo unremarkable with normal EF, resume Lasix today   03-12-2023 stable. Appears euvolemic. On RA.  Permanent atrial fibrillation (HCC) 03-12-2023 stable. On eliquis 2.5 mg bid.  GAD (generalized anxiety disorder) 03-12-2023 continue with prozac 20 mg qday.  Benign prostatic hyperplasia without lower urinary tract symptoms  03-12-2023 stable. On flomax 0.4 mg qday, proscar 5 mg qday.  S/P CABG (coronary artery bypass graft) 03-12-2023 stable  Essential hypertension 03-08-2023 thru 03-11-2023 BP is controlled.He was hypotensive 12/4 so continue to hold lasix metoprolol   03-12-2023 BP stable.off HTN meds  Hyperlipidemia, unspecified 03-12-2023 stable on crestor 5 mg at bedtime.  Gastro-esophageal reflux disease without esophagitis 03-12-2023 stable. On protonix 40 mg qday  Chronic indwelling Foley catheter 03-12-2023 pt's son Dorene Sorrow states pt has had chronic foley catheter for several months. Even 1 year  or longer.   DVT prophylaxis: apixaban (ELIQUIS) tablet 2.5 mg Start: 03/08/23 2200 apixaban (ELIQUIS) tablet 2.5 mg     Code Status: Full Code Family Communication: discussed with pt and son jerry at bedside Disposition Plan: return home Reason for continuing need for hospitalization: remains on IV Abx. Awaiting PT consult.  Objective: Vitals:   03/12/23 0455 03/12/23 0458 03/12/23 0836 03/12/23 1246  BP: 133/72  129/74 132/75  Pulse: 87  97 (!) 103  Resp: 18  18 18   Temp: (!) 97 F (36.1 C)  98.2 F (36.8 C) 98 F (36.7 C)  TempSrc: Oral  Oral Oral  SpO2: 96%  97% 99%  Weight:  88.5 kg      Intake/Output Summary (Last 24 hours) at 03/12/2023 1345 Last data filed at 03/12/2023 1311 Gross per 24 hour  Intake 123 ml  Output 1300 ml  Net -1177 ml   Filed Weights   03/09/23 0500 03/10/23 0500 03/12/23 0458  Weight: 65.1 kg 65.1 kg 88.5 kg    Examination:  Physical Exam Vitals and nursing note reviewed.  Constitutional:      General: He is not in acute distress.    Appearance: He is not toxic-appearing.     Comments: Appears chronically ill Appears stated age of 89 yo  HENT:     Head: Normocephalic and atraumatic.     Nose: Nose normal.  Cardiovascular:     Rate and Rhythm: Normal rate and regular rhythm.  Pulmonary:     Effort: Pulmonary effort is normal.     Comments: Dry rales No wheezing Abdominal:     General: Abdomen is flat.  Musculoskeletal:     Right lower leg: No edema.     Left lower leg: No edema.  Skin:    General: Skin is warm and dry.     Capillary Refill: Capillary refill takes less than 2 seconds.  Neurological:     Comments: Hard of hearing      Data Reviewed: I have personally reviewed following labs and imaging studies  CBC: Recent Labs  Lab 03/08/23 1033 03/08/23 1152 03/09/23 0659 03/10/23 0541 03/11/23 0541 03/12/23 0724  WBC 20.6*  --  12.6* 9.0 8.1 8.9  NEUTROABS 17.8*  --   --   --   --   --   HGB 10.4* 10.5* 9.2*  8.7* 8.3* 9.2*  HCT 32.2* 31.0* 27.2* 25.5* 24.6* 27.3*  MCV 103.2*  --  100.0 99.6 99.2 100.7*  PLT 80*  --  67* 66* 65* 66*   Basic Metabolic Panel: Recent Labs  Lab 03/08/23 1130 03/08/23 1152 03/09/23 0659 03/10/23 0540 03/10/23 0541 03/11/23 0541 03/12/23 0724  NA 135 136 135  --  130* 131* 134*  K 3.4* 3.5 3.4*  --  2.9* 4.1 4.1  CL 104 104 105  --  104 107 109  CO2 18*  --  20*  --  17* 16* 14*  GLUCOSE 119* 116*  111*  --  105* 118* 112*  BUN 24* 27* 30*  --  32* 20 12  CREATININE 1.53* 1.50* 1.50*  --  1.41* 1.25* 1.09  CALCIUM 9.1  --  8.6*  --  8.3* 8.3* 8.8*  MG 1.7  --   --  1.7  --   --   --    GFR: Estimated Creatinine Clearance: 41.6 mL/min (by C-G formula based on SCr of 1.09 mg/dL). Liver Function Tests: Recent Labs  Lab 03/08/23 1130 03/09/23 0659  AST 18 19  ALT 10 11  ALKPHOS 72 64  BILITOT 2.6* 1.5*  PROT 6.9 5.8*  ALBUMIN 3.2* 2.6*   BNP (last 3 results) Recent Labs    05/11/22 2301 09/27/22 2006 03/08/23 1130  BNP 169.5* 559.6* 847.7*   CBG: Recent Labs  Lab 03/08/23 1030  GLUCAP 127*   Sepsis Labs: Recent Labs  Lab 03/08/23 1313 03/08/23 1607  LATICACIDVEN 2.1* 1.5    Recent Results (from the past 240 hour(s))  Resp panel by RT-PCR (RSV, Flu A&B, Covid) Anterior Nasal Swab     Status: None   Collection Time: 03/08/23 10:33 AM   Specimen: Anterior Nasal Swab  Result Value Ref Range Status   SARS Coronavirus 2 by RT PCR NEGATIVE NEGATIVE Final   Influenza A by PCR NEGATIVE NEGATIVE Final   Influenza B by PCR NEGATIVE NEGATIVE Final    Comment: (NOTE) The Xpert Xpress SARS-CoV-2/FLU/RSV plus assay is intended as an aid in the diagnosis of influenza from Nasopharyngeal swab specimens and should not be used as a sole basis for treatment. Nasal washings and aspirates are unacceptable for Xpert Xpress SARS-CoV-2/FLU/RSV testing.  Fact Sheet for Patients: BloggerCourse.com  Fact Sheet for Healthcare  Providers: SeriousBroker.it  This test is not yet approved or cleared by the [place] FDA and has been authorized for detection and/or diagnosis of SARS-CoV-2 by FDA under an Emergency Use Authorization (EUA). This EUA will remain in effect (meaning this test can be used) for the duration of the COVID-19 declaration under Section 564(b)(1) of the Act, 21 U.S.C. section 360bbb-3(b)(1), unless the authorization is terminated or revoked.     Resp Syncytial Virus by PCR NEGATIVE NEGATIVE Final    Comment: (NOTE) Fact Sheet for Patients: BloggerCourse.com  Fact Sheet for Healthcare Providers: SeriousBroker.it  This test is not yet approved or cleared by the [place] FDA and has been authorized for detection and/or diagnosis of SARS-CoV-2 by FDA under an Emergency Use Authorization (EUA). This EUA will remain in effect (meaning this test can be used) for the duration of the COVID-19 declaration under Section 564(b)(1) of the Act, 21 U.S.C. section 360bbb-3(b)(1), unless the authorization is terminated or revoked.  Performed at [facility], [address] [place], [place] [address]   Culture, blood (routine x 2)     Status: Abnormal (Preliminary result)   Collection Time: 03/08/23 10:45 AM   Specimen: BLOOD RIGHT FOREARM  Result Value Ref Range Status   Specimen Description BLOOD RIGHT FOREARM  Final   Special Requests   Final    BOTTLES DRAWN AEROBIC AND ANAEROBIC Blood Culture results may not be optimal due to an inadequate volume of blood received in culture bottles   Culture  Setup Time   Final    GRAM NEGATIVE RODS IN BOTH AEROBIC AND ANAEROBIC BOTTLES CRITICAL RESULT CALLED TO, READ BACK BY AND VERIFIED WITH: PHARMD MADISON OWEN ON 03/09/23 @ 1751 BY DRT    Culture (A)  Final    HAEMOPHILUS INFLUENZAE BETA LACTAMASE NEGATIVE HEALTH DEPARTMENT NOTIFIED Performed at [facility], [address] [place], [place] [address]    Report Status PENDING  Incomplete  Blood Culture ID Panel (Reflexed)     Status: Abnormal   Collection Time: 03/08/23 10:45 AM  Result Value Ref Range Status   Enterococcus faecalis NOT DETECTED NOT DETECTED Final   Enterococcus Faecium NOT DETECTED NOT DETECTED Final   Listeria monocytogenes NOT DETECTED NOT DETECTED Final   Staphylococcus species NOT DETECTED NOT DETECTED Final   Staphylococcus aureus (BCID) NOT DETECTED NOT DETECTED Final   Staphylococcus epidermidis NOT DETECTED NOT DETECTED Final   Staphylococcus lugdunensis NOT DETECTED NOT DETECTED Final   Streptococcus species NOT DETECTED NOT DETECTED Final   Streptococcus agalactiae NOT DETECTED NOT DETECTED Final   Streptococcus pneumoniae NOT DETECTED NOT DETECTED Final   Streptococcus pyogenes NOT DETECTED NOT DETECTED Final   A.calcoaceticus-baumannii NOT DETECTED NOT DETECTED Final   Bacteroides fragilis NOT DETECTED NOT DETECTED Final   Enterobacterales NOT DETECTED NOT DETECTED Final   Enterobacter cloacae complex NOT DETECTED NOT DETECTED Final   Escherichia coli NOT DETECTED NOT DETECTED Final   Klebsiella aerogenes NOT DETECTED NOT DETECTED Final   Klebsiella oxytoca NOT DETECTED NOT DETECTED Final   Klebsiella pneumoniae NOT DETECTED NOT DETECTED Final   Proteus species NOT DETECTED NOT DETECTED Final   Salmonella species NOT DETECTED NOT DETECTED Final   Serratia marcescens NOT DETECTED NOT DETECTED Final   Haemophilus influenzae DETECTED (A) NOT DETECTED Final    Comment: CRITICAL RESULT CALLED TO, READ BACK BY AND VERIFIED WITH: PHARMD MADISON OWEN ON 03/09/23 @ 1751 BY DRT    Neisseria meningitidis NOT DETECTED NOT DETECTED Final   Pseudomonas aeruginosa NOT DETECTED NOT DETECTED Final   Stenotrophomonas maltophilia NOT DETECTED NOT DETECTED Final   Candida albicans NOT DETECTED NOT DETECTED Final   Candida auris NOT DETECTED NOT DETECTED Final    Candida glabrata NOT DETECTED NOT DETECTED Final   Candida krusei NOT DETECTED NOT DETECTED Final   Candida parapsilosis NOT DETECTED NOT DETECTED Final   Candida tropicalis NOT DETECTED NOT DETECTED Final   Cryptococcus neoformans/gattii NOT DETECTED NOT DETECTED Final    Comment: Performed at [facility], [address] [place], [place] [address]  Culture, blood (routine x 2)     Status: Abnormal   Collection Time: 03/08/23 10:50 AM   Specimen: BLOOD  Result Value Ref Range Status   Specimen Description BLOOD RIGHT ANTECUBITAL  Final   Special Requests   Final    BOTTLES DRAWN AEROBIC AND ANAEROBIC Blood Culture results may not be optimal due to an inadequate volume of blood received in culture bottles   Culture  Setup Time   Final    GRAM NEGATIVE RODS IN BOTH AEROBIC AND ANAEROBIC BOTTLES CRITICAL VALUE NOTED.  VALUE IS CONSISTENT WITH PREVIOUSLY REPORTED AND CALLED VALUE.    Culture HAEMOPHILUS INFLUENZAE BETA LACTAMASE NEGATIVE  (A)  Final   Report Status 03/11/2023 FINAL  Final  MRSA Next Gen by PCR, Nasal     Status: Abnormal   Collection Time: 03/10/23  3:18 PM   Specimen: Nasal Mucosa; Nasal Swab  Result Value Ref Range Status   MRSA by PCR Next Gen DETECTED (A) NOT DETECTED Final    Comment: RESULT CALLED TO, READ BACK BY AND VERIFIED WITH:  RN Chinita Greenland [phone] @ 2000 FH (NOTE) The GeneXpert MRSA Assay (FDA approved for NASAL  specimens only), is one component of a comprehensive MRSA colonization surveillance program. It is not intended to diagnose MRSA infection nor to guide or monitor treatment for MRSA infections. Test performance is not FDA approved in patients less than 25 years old. Performed at [facility], [address], [place], [place] [address]   Culture, blood (Routine X 2) w Reflex to ID Panel     Status: None (Preliminary result)   Collection Time: 03/11/23 11:44 AM   Specimen: BLOOD RIGHT HAND  Result Value Ref Range Status    Specimen Description BLOOD RIGHT HAND  Final   Special Requests   Final    BOTTLES DRAWN AEROBIC AND ANAEROBIC Blood Culture results may not be optimal due to an inadequate volume of blood received in culture bottles   Culture   Final    NO GROWTH < 24 HOURS Performed at [facility], [address], [place], [place] [address]    Report Status PENDING  Incomplete  Culture, blood (Routine X 2) w Reflex to ID Panel     Status: None (Preliminary result)   Collection Time: 03/11/23 11:45 AM   Specimen: BLOOD LEFT HAND  Result Value Ref Range Status   Specimen Description BLOOD LEFT HAND  Final   Special Requests   Final    BOTTLES DRAWN AEROBIC AND ANAEROBIC Blood Culture results may not be optimal due to an inadequate volume of blood received in culture bottles   Culture   Final    NO GROWTH < 24 HOURS Performed at [facility], [address], [place], [place] [address]    Report Status PENDING  Incomplete     Radiology Studies: DG Chest Port 1 View  Result Date: 03/11/2023 CLINICAL DATA:  Pneumonia EXAM: PORTABLE CHEST 1 VIEW COMPARISON:  Chest radiograph dated 03/08/2023. FINDINGS: The heart is enlarged. Vascular calcifications are seen in the aortic arch. Moderate bilateral interstitial and airspace opacities appear increased from prior exam. A small right pleural effusion may contribute. No left pleural effusion. No pneumothorax. Degenerative changes are seen in the spine. IMPRESSION: Moderate bilateral interstitial and airspace opacities appear increased from prior exam. Electronically Signed   By: Romona Curls M.D.   On: 03/11/2023 10:48   ECHOCARDIOGRAM COMPLETE  Result Date: 03/10/2023    ECHOCARDIOGRAM REPORT   Patient Name:   CARLON WININGS Date of Exam: 03/10/2023 Medical Rec #:  [id number]    Height:       63.0 in Accession #:    [id number]   Weight:       143.5 lb Date of Birth:  June 10, 1927    BSA:          1.679 m Patient Age:    89 years     BP:           108/61  mmHg Patient Gender: M            HR:           77 bpm. Exam Location:  Inpatient Procedure: 2D Echo, Cardiac Doppler and Color Doppler Indications:    Cardiomegaly  History:        Patient has prior history of Echocardiogram examinations, most                 recent 04/28/2022. CAD, Prior CABG, Stroke and COPD, Aortic Valve                 Disease, Arrythmias:Atrial Fibrillation,  Signs/Symptoms:Hypotension, Altered Mental Status and Shortness                 of Breath; Risk Factors:Hypertension and Dyslipidemia. Dementia,                 lung CA.  Sonographer:    Milda Smart Referring Phys: [id number] RAMESH KC  Sonographer Comments: Image acquisition challenging due to patient body habitus. IMPRESSIONS  1. Left ventricular ejection fraction, by estimation, is 60 to 65%. The left ventricle has normal function. The left ventricle has no regional wall motion abnormalities. There is mild left ventricular hypertrophy. Left ventricular diastolic parameters are indeterminate.  2. Right ventricular systolic function is normal. The right ventricular size is normal. There is moderately elevated pulmonary artery systolic pressure. The estimated right ventricular systolic pressure is 54.8 mmHg.  3. Left atrial size was severely dilated.  4. Right atrial size was moderately dilated.  5. The mitral valve is normal in structure. Mild mitral valve regurgitation.  6. Tricuspid valve regurgitation is moderate.  7. The aortic valve is tricuspid. Aortic valve regurgitation is trivial. Aortic valve sclerosis/calcification is present, without any evidence of aortic stenosis.  8. The inferior vena cava is normal in size with greater than 50% respiratory variability, suggesting right atrial pressure of 3 mmHg. FINDINGS  Left Ventricle: Left ventricular ejection fraction, by estimation, is 60 to 65%. The left ventricle has normal function. The left ventricle has no regional wall motion abnormalities. The left ventricular  internal cavity size was normal in size. There is  mild left ventricular hypertrophy. Abnormal (paradoxical) septal motion consistent with post-operative status. Left ventricular diastolic parameters are indeterminate. Right Ventricle: The right ventricular size is normal. Right ventricular systolic function is normal. There is moderately elevated pulmonary artery systolic pressure. The tricuspid regurgitant velocity is 3.60 m/s, and with an assumed right atrial pressure of 3 mmHg, the estimated right ventricular systolic pressure is 54.8 mmHg. Left Atrium: Left atrial size was severely dilated. Right Atrium: Right atrial size was moderately dilated. Pericardium: There is no evidence of pericardial effusion. Mitral Valve: The mitral valve is normal in structure. Mild mitral valve regurgitation. Tricuspid Valve: Tricuspid valve regurgitation is moderate. Aortic Valve: The aortic valve is tricuspid. Aortic valve regurgitation is trivial. Aortic valve sclerosis/calcification is present, without any evidence of aortic stenosis. Pulmonic Valve: Pulmonic valve regurgitation is not visualized. Aorta: The aortic root and ascending aorta are structurally normal, with no evidence of dilitation. Venous: The inferior vena cava is normal in size with greater than 50% respiratory variability, suggesting right atrial pressure of 3 mmHg. IAS/Shunts: The interatrial septum was not well visualized.  LEFT VENTRICLE PLAX 2D LVIDd:         4.40 cm   Diastology LVIDs:         3.00 cm   LV e' medial:    4.88 cm/s LV PW:         1.10 cm   LV E/e' medial:  1.0 LV IVS:        1.10 cm   LV e' lateral:   8.73 cm/s LVOT diam:     2.10 cm   LV E/e' lateral: 0.6 LV SV:         44 LV SV Index:   26 LVOT Area:     3.46 cm  RIGHT VENTRICLE            IVC RV S prime:     9.67 cm/s  IVC diam: 2.20 cm TAPSE (  M-mode): 1.7 cm LEFT ATRIUM              Index        RIGHT ATRIUM           Index LA diam:        4.10 cm  2.44 cm/m   RA Area:     16.90 cm  LA Vol (A2C):   106.0 ml 63.12 ml/m  RA Volume:   44.70 ml  26.62 ml/m LA Vol (A4C):   108.0 ml 64.31 ml/m LA Biplane Vol: 110.0 ml 65.50 ml/m  AORTIC VALVE LVOT Vmax:   55.60 cm/s LVOT Vmean:  40.900 cm/s LVOT VTI:    0.128 m  AORTA Ao Root diam: 3.60 cm Ao Asc diam:  3.70 cm MR Peak grad: 97.2 mmHg   TRICUSPID VALVE MR Vmax:      493.00 cm/s TR Peak grad:   51.8 mmHg MV E velocity: 4.88 cm/s  TR Vmax:        360.00 cm/s                            SHUNTS                           Systemic VTI:  0.13 m                           Systemic Diam: 2.10 cm Carolan Clines Electronically signed by Carolan Clines Signature Date/Time: 03/10/2023/3:18:32 PM    Final     Scheduled Meds:  acetylcysteine  4 mL Nebulization BID   apixaban  2.5 mg Oral BID   Chlorhexidine Gluconate Cloth  6 each Topical Daily   finasteride  5 mg Oral Daily   FLUoxetine  20 mg Oral Daily   guaiFENesin  600 mg Oral BID   mometasone-formoterol  2 puff Inhalation BID   pantoprazole  40 mg Oral q morning   rosuvastatin  5 mg Oral QHS   sodium chloride flush  3 mL Intravenous Q12H   tamsulosin  0.4 mg Oral QHS   Continuous Infusions:  cefTRIAXone (ROCEPHIN)  IV 2 g (03/12/23 1010)     LOS: 3 days   Time spent: 40 minutes  Carollee Herter, DO  [facility]  03/12/2023, 1:45 PM

## 2023-03-12 NOTE — Assessment & Plan Note (Addendum)
03-08-2023 thru 03-11-2023 Has background history of chronic respiratory failure?(Not on oxygen), history of lung cancer, COPD, radiation pneumonitis, Chronic PE noticed but unchanged now with pneumonia> has Hemophilus bacteremia in BCID likely translocation from pneumonia-discussed with pharmacy, changed antibiotic to ceftriaxone, follow-up C/S. Echo obtained no acute finding currently afebrile leukocytosis resolved-msg sent to Dr Drue Second for antibiotic recommendation- she is repeating Blood culture and advised to continue ceftriaxone 2gm iv qd,  an switch to amox/clav when ready for discharge-total duration of treatment 2 weeks. Continue modified diet per SLP, continue antibiotics 03-12-2023 remains on IV rocephin. Switch to augmentin at discharge. Awaiting PT consult. Pt's son Dorene Sorrow says that pt was able to walk twice a day in their home. Mostly uses WC for longer distances.  03-13-2023 will repeat CT chest without contrast to assess for empyema given H. Flu septicemia. If CT chest shows improvement, pt can possibly go home later today. *update. CT chest shows RML/RLL. This is expected. Pt does not have empyema which was my concern. Will DC today with 10 additional days of po augmetin 875 mg bid. He had 6 days of IV rocephin. He will need repeat CXR at end of Jan 2025 at PCP office to document resolution of his pneumonia. Discussed this with his dtr [name] at bedside.  Dtr billie states pt's Ivor Costa was discovered to have pneumonia after his visit fo pt's home on Thanksgiving. Great-grandson treated for pneumonia. This is likely the source of pt's H. Flu pneumonia.

## 2023-03-12 NOTE — Assessment & Plan Note (Signed)
03-12-2023 ID consulted. Repeat blood cx drawn. Echo negative.

## 2023-03-12 NOTE — Assessment & Plan Note (Signed)
03-12-2023 stable. On eliquis 2.5 mg bid.

## 2023-03-12 NOTE — Assessment & Plan Note (Signed)
03-12-2023 stable

## 2023-03-12 NOTE — Assessment & Plan Note (Signed)
03-12-2023 chronic.

## 2023-03-12 NOTE — Subjective & Objective (Signed)
Pt seen and examined. Pt's son jerry at bedside Pt lives with son Dorene Sorrow. Pt only able to walk about 2 times per day in their home. Uses WC most of the time. Pt c/o right ear pain.  On IV rocephin for haemophilus pneumonia.

## 2023-03-12 NOTE — Assessment & Plan Note (Signed)
03-12-2023 stable. Hx of radiation pneumonitis. Stable. Continue with nebs and inhalers.

## 2023-03-12 NOTE — Assessment & Plan Note (Signed)
03-12-2023 stable. On flomax 0.4 mg qday, proscar 5 mg qday.

## 2023-03-12 NOTE — Assessment & Plan Note (Signed)
03-12-2023 stable. On protonix 40 mg qday

## 2023-03-12 NOTE — Progress Notes (Signed)
   Went to re-examine pt's right EAC. Medical unit otoscope was not charged this AM. Pt with occluded right EAC with cerumen. Cannot visualized his TM.  Carollee Herter, DO [facility]

## 2023-03-12 NOTE — Assessment & Plan Note (Signed)
03-12-2023 pt's son Dorene Sorrow states pt has had chronic foley catheter for several months. Even 1 year or longer.

## 2023-03-12 NOTE — Assessment & Plan Note (Signed)
03-08-2023 thru 03-11-2023 BNP 847 on admission previously in 160s to 550s, echo from July 2020 was 40-50-55%.  On Lasix 40 mg at home and holding for now-CT scan on admission shows groundglass opacities and possible mild CHF/chronic interstitial lung disease-repeat echo unremarkable with normal EF, resume Lasix today   03-12-2023 stable. Appears euvolemic. On RA.  03-13-2023 needed 1 dose of IV lasix. Lasix had been held due to AKI on admission. Will restart lasix 40 mg daily.

## 2023-03-12 NOTE — Assessment & Plan Note (Signed)
03-12-2023 stable. Unclear if he is on chronic O2 at home. Currently on RA here in hospital.

## 2023-03-12 NOTE — Assessment & Plan Note (Signed)
03-12-2023 stable on crestor 5 mg at bedtime.

## 2023-03-12 NOTE — Assessment & Plan Note (Signed)
03-12-2023 stable.  Iron/TIBC/Ferritin/ %Sat    Component Value Date/Time   IRON 71 09/27/2022 2018   TIBC 262 09/27/2022 2018   FERRITIN 88 09/27/2022 2018   IRONPCTSAT 27 09/27/2022 2018

## 2023-03-12 NOTE — Assessment & Plan Note (Signed)
03-12-2023 small chronic PE seen on initial CTPA on admission. On eliquis 2.5 mg bid.

## 2023-03-12 NOTE — Assessment & Plan Note (Signed)
03-08-2023 thru 03-12-2023 B/l creat ~ 1.3- 1.5 -creatinine nicely improved to 1.2 baseline with metabolic acidosis add sodium bicarb resume home Lasix and monitor  03-12-2023 stable.

## 2023-03-13 ENCOUNTER — Other Ambulatory Visit (HOSPITAL_COMMUNITY): Payer: Self-pay

## 2023-03-13 ENCOUNTER — Inpatient Hospital Stay (HOSPITAL_COMMUNITY): Payer: Medicare HMO

## 2023-03-13 DIAGNOSIS — Z978 Presence of other specified devices: Secondary | ICD-10-CM

## 2023-03-13 DIAGNOSIS — E8721 Acute metabolic acidosis: Secondary | ICD-10-CM

## 2023-03-13 DIAGNOSIS — I5020 Unspecified systolic (congestive) heart failure: Secondary | ICD-10-CM

## 2023-03-13 DIAGNOSIS — N1832 Chronic kidney disease, stage 3b: Secondary | ICD-10-CM | POA: Diagnosis not present

## 2023-03-13 DIAGNOSIS — J14 Pneumonia due to Hemophilus influenzae: Secondary | ICD-10-CM | POA: Diagnosis not present

## 2023-03-13 DIAGNOSIS — A413 Sepsis due to Hemophilus influenzae: Secondary | ICD-10-CM | POA: Diagnosis not present

## 2023-03-13 LAB — COMPREHENSIVE METABOLIC PANEL
ALT: 13 U/L (ref 0–44)
AST: 16 U/L (ref 15–41)
Albumin: 2.4 g/dL — ABNORMAL LOW (ref 3.5–5.0)
Alkaline Phosphatase: 76 U/L (ref 38–126)
Anion gap: 8 (ref 5–15)
BUN: 11 mg/dL (ref 8–23)
CO2: 19 mmol/L — ABNORMAL LOW (ref 22–32)
Calcium: 8.3 mg/dL — ABNORMAL LOW (ref 8.9–10.3)
Chloride: 107 mmol/L (ref 98–111)
Creatinine, Ser: 1.08 mg/dL (ref 0.61–1.24)
GFR, Estimated: >60 mL/min (ref >60)
Glucose, Bld: 98 mg/dL (ref 70–99)
Potassium: 3.7 mmol/L (ref 3.5–5.1)
Sodium: 134 mmol/L — ABNORMAL LOW (ref 135–145)
Total Bilirubin: 0.9 mg/dL (ref <1.2)
Total Protein: 6 g/dL — ABNORMAL LOW (ref 6.5–8.1)

## 2023-03-13 LAB — CBC WITH DIFFERENTIAL/PLATELET
Abs Immature Granulocytes: 0.1 10*3/uL — ABNORMAL HIGH (ref 0.00–0.07)
Basophils Absolute: 0 10*3/uL (ref 0.0–0.1)
Basophils Relative: 1 %
Eosinophils Absolute: 0.1 10*3/uL (ref 0.0–0.5)
Eosinophils Relative: 1 %
HCT: 26.7 % — ABNORMAL LOW (ref 39.0–52.0)
Hemoglobin: 8.9 g/dL — ABNORMAL LOW (ref 13.0–17.0)
Immature Granulocytes: 1 %
Lymphocytes Relative: 18 %
Lymphs Abs: 1.3 10*3/uL (ref 0.7–4.0)
MCH: 33.2 pg (ref 26.0–34.0)
MCHC: 33.3 g/dL (ref 30.0–36.0)
MCV: 99.6 fL (ref 80.0–100.0)
Monocytes Absolute: 0.8 10*3/uL (ref 0.1–1.0)
Monocytes Relative: 11 %
Neutro Abs: 5.1 10*3/uL (ref 1.7–7.7)
Neutrophils Relative %: 68 %
Platelets: 76 10*3/uL — ABNORMAL LOW (ref 150–400)
RBC: 2.68 MIL/uL — ABNORMAL LOW (ref 4.22–5.81)
RDW: 14.3 % (ref 11.5–15.5)
WBC: 7.4 10*3/uL (ref 4.0–10.5)
nRBC: 0 % (ref 0.0–0.2)

## 2023-03-13 LAB — BRAIN NATRIURETIC PEPTIDE: B Natriuretic Peptide: 612.7 pg/mL — ABNORMAL HIGH (ref 0.0–100.0)

## 2023-03-13 LAB — PROCALCITONIN: Procalcitonin: 0.27 ng/mL

## 2023-03-13 LAB — MAGNESIUM: Magnesium: 1.9 mg/dL (ref 1.7–2.4)

## 2023-03-13 MED ORDER — AMOXICILLIN-POT CLAVULANATE 875-125 MG PO TABS
1.0000 | ORAL_TABLET | Freq: Two times a day (BID) | ORAL | 0 refills | Status: AC
  Filled 2023-03-13: qty 20, 10d supply, fill #0

## 2023-03-13 MED ORDER — MUPIROCIN 2 % EX OINT: TOPICAL_OINTMENT | Freq: Two times a day (BID) | CUTANEOUS | Status: DC

## 2023-03-13 MED ORDER — CIPROFLOXACIN HCL 0.3 % OP SOLN
2.0000 [drp] | OPHTHALMIC | Status: DC
  Administered 2023-03-13: 2 [drp] via OPHTHALMIC
  Filled 2023-03-13: qty 2.5

## 2023-03-13 MED ORDER — CIPROFLOXACIN HCL 0.3 % OP SOLN
2.0000 [drp] | OPHTHALMIC | 0 refills | Status: DC
  Filled 2023-03-13: qty 5, 10d supply, fill #0

## 2023-03-13 MED ORDER — SODIUM BICARBONATE 650 MG PO TABS
650.0000 mg | ORAL_TABLET | Freq: Two times a day (BID) | ORAL | 0 refills | Status: AC
  Filled 2023-03-13: qty 60, 30d supply, fill #0

## 2023-03-13 MED ORDER — FUROSEMIDE 40 MG PO TABS
40.0000 mg | ORAL_TABLET | Freq: Every day | ORAL | Status: DC
  Administered 2023-03-13: 40 mg via ORAL
  Filled 2023-03-13: qty 1

## 2023-03-13 NOTE — Discharge Summary (Signed)
[facility] Physician Discharge Summary   Patient name: Evan [name]  Admit date:     03/08/2023  Discharge date: 03/13/2023  Attending Physician: Lanae Boast [[id number]]  Discharge Physician: Carollee Herter   PCP: Donita Brooks, MD  Admitted From: Home  Disposition:  Home  Recommendations for Outpatient Follow-up:  Follow up with PCP in 1-2 weeks Please follow up on the following pending results: pt will need repeat CXR in 4-6 weeks to document resolution of his pneumonia. He will also need repeat BMP on f/u office visit for management of his metabolic acidosis.  Home Health:No Equipment/Devices: None  Discharge Condition:Stable CODE STATUS:FULL Diet recommendation: Heart Healthy Fluid Restriction: None  Hospital Summary: HPI: 89 y.o. male with medical history significant of hypertension, hyperlipidemia, GERD, CKD 3, BPH, anemia, atrial fibrillation, subdural hematoma, CVA, CAD status post CABG, QT prolongation, dementia, sundowning, hearing loss, anxiety, depression, chronic respiratory failure with hypoxia, COPD, lung cancer, radiation pneumonitis, rheumatoid arthritis, PE presenting with shortness of breath and altered mental status.  Per family he is hard of hearing, baseline dementia and was exposed to 2 people who were found later to have pneumonia at Thanksgiving.  Now, for the past 2 days patient has had a low-grade fever around 99.5 and has been reporting some shortness of breath as well as orthopnea and oral intake.  He has baseline tremors and has been worse past 2 days. In ED  BP- 110s to 130s systolic, respiratory rate in the 20s to 30s.  Labs>> mild hypokalemia creatinine stable at 1.53, albumin 3.2, T. bili 2.6. Leukocytosis 20.6, hemoglobin stable at 10.4, platelets stable at 80.  BNP elevated to 847.  Lactic acid borderline at 2.1 and repeat  1.5 RVP panel for flu COVID and RSV negative. Blood cultures sent CXR>>possible recurrent airspace disease suspicious for  pneumonia and chronic L1 compression fracture noted. CTA PE>> no acute PE, small chronic segmental PE stable from prior CTA, right lower lobe consolidation, groundglass opacities and interstitial thickening similar to prior CT Patient is admitted for further management  Significant Events: Admitted 03/08/2023 for pneumonia   Significant Labs: Admission WBC 20.6, HgB 10.4, Plt 80, Na 135, K 3.4, Bicarb 18, BUN 24, Scr 1.53 03-08-2023 Blood cx positive for Haemophilus influenzae  Significant Imaging Studies: Admission CTPA shows No evidence of an acute pulmonary embolism. Small chronic segmental pulmonary embolism to the posterior basilar segment of the right lower lobe stable from the prior CTA. 2. Right lower lobe consolidation consistent with pneumonia. 3. Other ground-glass lung opacities and interstitial thickening are similar to the prior CT. Consider underlying mild congestive heart failure with pulmonary edema as well as chronic interstitial lung disease in the differential. 4. Stable mild mediastinal right hilar lymphadenopathy, presumed reactive. 5. Stable mild cardiomegaly, stable changes from prior CABG surgery, coronary artery calcifications and aortic atherosclerosis. 03-10-2023 MBS shows Pt demonstrates very functional swallowing ability with mild oral dysphagia characterized by prolonged mastication, lingual rocking with solids, residue in the floor of mouth and residue around teeth with a sticky solid  03-10-2023 echo shows LVEF 60%  Antibiotic Therapy: Anti-infectives (From admission, onward)    Start     Dose/Rate Route Frequency Ordered Stop   03/11/23 1000  cefTRIAXone (ROCEPHIN) 2 g in sodium chloride 0.9 % 100 mL IVPB        2 g 200 mL/hr over 30 Minutes Intravenous Every 24 hours 03/10/23 1026     03/10/23 1115  cefTRIAXone (ROCEPHIN) 1 g in sodium chloride  0.9 % 100 mL IVPB        1 g 200 mL/hr over 30 Minutes Intravenous  Once 03/10/23 1026 03/10/23 1215   03/09/23 1000   cefTRIAXone (ROCEPHIN) 1 g in sodium chloride 0.9 % 100 mL IVPB  Status:  Discontinued        1 g 200 mL/hr over 30 Minutes Intravenous Every 24 hours 03/08/23 1556 03/08/23 1652   03/09/23 1000  cefTRIAXone (ROCEPHIN) 1 g in sodium chloride 0.9 % 100 mL IVPB  Status:  Discontinued        1 g 200 mL/hr over 30 Minutes Intravenous Every 24 hours 03/08/23 1652 03/10/23 1026   03/08/23 2200  doxycycline (VIBRA-TABS) tablet 100 mg  Status:  Discontinued        100 mg Oral Every 12 hours 03/08/23 1556 03/10/23 0814   03/08/23 1400  cefTRIAXone (ROCEPHIN) 1 g in sodium chloride 0.9 % 100 mL IVPB        1 g 200 mL/hr over 30 Minutes Intravenous  Once 03/08/23 1346 03/08/23 1507   03/08/23 1400  doxycycline (VIBRA-TABS) tablet 100 mg        100 mg Oral  Once 03/08/23 1346 03/08/23 1435       Procedures:   Consultants: ID   Hospital Course by Problem: * PNA (pneumonia) 03-08-2023 thru 03-11-2023 Has background history of chronic respiratory failure?(Not on oxygen), history of lung cancer, COPD, radiation pneumonitis, Chronic PE noticed but unchanged now with pneumonia> has Hemophilus bacteremia in BCID likely translocation from pneumonia-discussed with pharmacy, changed antibiotic to ceftriaxone, follow-up C/S. Echo obtained no acute finding currently afebrile leukocytosis resolved-msg sent to Dr Drue Second for antibiotic recommendation- she is repeating Blood culture and advised to continue ceftriaxone 2gm iv qd,  an switch to amox/clav when ready for discharge-total duration of treatment 2 weeks. Continue modified diet per SLP, continue antibiotics 03-12-2023 remains on IV rocephin. Switch to augmentin at discharge. Awaiting PT consult. Pt's son Dorene Sorrow says that pt was able to walk twice a day in their home. Mostly uses WC for longer distances.  03-13-2023 will repeat CT chest without contrast to assess for empyema given H. Flu septicemia. If CT chest shows improvement, pt can possibly go home later  today. *update. CT chest shows RML/RLL. This is expected. Pt does not have empyema which was my concern. Will DC today with 10 additional days of po augmetin 875 mg bid. He had 6 days of IV rocephin. He will need repeat CXR at end of Jan 2025 at PCP office to document resolution of his pneumonia. Discussed this with his dtr [name] at bedside.  Dtr billie states pt's Ivor Costa was discovered to have pneumonia after his visit fo pt's home on Thanksgiving. Great-grandson treated for pneumonia. This is likely the source of pt's H. Flu pneumonia.  Haemophilus influenzae septicemia (HCC) 03-12-2023 ID consulted. Repeat blood cx drawn. Echo negative.  Acute metabolic acidosis 03-13-2023 continue with po bicarb at home 650 mg bid. F/u with PCP for repeat BMP.  Chronic indwelling Foley catheter 03-12-2023 pt's son Dorene Sorrow states pt has had chronic foley catheter for several months. Even 1 year or longer.  Anemia 03-12-2023 stable.  Iron/TIBC/Ferritin/ %Sat    Component Value Date/Time   IRON 71 09/27/2022 2018   TIBC 262 09/27/2022 2018   FERRITIN 88 09/27/2022 2018   IRONPCTSAT 27 09/27/2022 2018     Dementia without behavioral disturbance (HCC) 03-12-2023 chronic.  History of pulmonary embolism 03-12-2023 small chronic PE seen on  initial CTPA on admission. On eliquis 2.5 mg bid.  Chronic respiratory failure, unsp w hypoxia or hypercapnia (HCC) 03-12-2023 stable. Unclear if he is on chronic O2 at home. Currently on RA here in hospital.  Hemiparesis due to old stroke (HCC) 03-12-2023 chronic.  Pressure ulcer of back 03-12-2023 sacral pressure stage 1. Identified present on admission. Seen nursing notes.  COPD (chronic obstructive pulmonary disease) (HCC) 03-12-2023 stable. Hx of radiation pneumonitis. Stable. Continue with nebs and inhalers.  CKD stage 3b, GFR 30-44 ml/min (HCC) - baseline SCr 1.2-1.5 03-08-2023 thru 03-12-2023 B/l creat ~ 1.3- 1.5 -creatinine nicely improved to 1.2 baseline  with metabolic acidosis add sodium bicarb resume home Lasix and monitor  03-12-2023 stable.   HFmrEF (heart failure with mildly reduced ejection fraction) (HCC) 03-08-2023 thru 03-11-2023 BNP 847 on admission previously in 160s to 550s, echo from July 2020 was 40-50-55%.  On Lasix 40 mg at home and holding for now-CT scan on admission shows groundglass opacities and possible mild CHF/chronic interstitial lung disease-repeat echo unremarkable with normal EF, resume Lasix today   03-12-2023 stable. Appears euvolemic. On RA.  03-13-2023 needed 1 dose of IV lasix. Lasix had been held due to AKI on admission. Will restart lasix 40 mg daily.  Permanent atrial fibrillation (HCC) 03-12-2023 stable. On eliquis 2.5 mg bid.  GAD (generalized anxiety disorder) 03-12-2023 continue with prozac 20 mg qday.  Benign prostatic hyperplasia without lower urinary tract symptoms 03-12-2023 stable. On flomax 0.4 mg qday, proscar 5 mg qday.  S/P CABG (coronary artery bypass graft) 03-12-2023 stable  Essential hypertension 03-08-2023 thru 03-11-2023 BP is controlled.He was hypotensive 12/4 so continue to hold lasix metoprolol   03-12-2023 BP stable.off HTN meds  Hyperlipidemia, unspecified 03-12-2023 stable on crestor 5 mg at bedtime.  Gastro-esophageal reflux disease without esophagitis 03-12-2023 stable. On protonix 40 mg qday    Discharge Diagnoses:  Principal Problem:   PNA (pneumonia) Active Problems:   Haemophilus influenzae septicemia (HCC)   Acute metabolic acidosis   Gastro-esophageal reflux disease without esophagitis   Hyperlipidemia, unspecified   Essential hypertension   S/P CABG (coronary artery bypass graft)   Benign prostatic hyperplasia without lower urinary tract symptoms   GAD (generalized anxiety disorder)   Permanent atrial fibrillation (HCC)   HFmrEF (heart failure with mildly reduced ejection fraction) (HCC)   CKD stage 3b, GFR 30-44 ml/min (HCC) - baseline SCr 1.2-1.5   COPD  (chronic obstructive pulmonary disease) (HCC)   Pressure ulcer of back   Hemiparesis due to old stroke (HCC)   Chronic respiratory failure, unsp w hypoxia or hypercapnia (HCC)   History of pulmonary embolism   Dementia without behavioral disturbance (HCC)   Anemia   Chronic indwelling Foley catheter   Discharge Instructions  Discharge Instructions     Call MD for:  difficulty breathing, headache or visual disturbances   Complete by: As directed    Call MD for:  extreme fatigue   Complete by: As directed    Call MD for:  persistant dizziness or light-headedness   Complete by: As directed    Call MD for:  persistant nausea and vomiting   Complete by: As directed    Call MD for:  severe uncontrolled pain   Complete by: As directed    Call MD for:  temperature >100.4   Complete by: As directed    Diet - low sodium heart healthy   Complete by: As directed    Discharge instructions   Complete by: As directed  1. Follow up with primary care provider in 1-2 weeks following discharge from hospital.   Increase activity slowly   Complete by: As directed    No wound care   Complete by: As directed       Allergies as of 03/13/2023       Reactions   Feldene [piroxicam] Rash   Blistering rash   Neomycin-polymyxin-hc Itching   Statins Other (See Comments)   Myalgias   Latex Rash   Band Aid   Tequin [gatifloxacin] Swelling, Anxiety   Valium [diazepam] Other (See Comments)   Dizziness   Vibramycin [doxycycline] Itching, Nausea Only        Medication List     TAKE these medications    acetaminophen 500 MG tablet Commonly known as: TYLENOL Take 500 mg by mouth 2 (two) times daily as needed for mild pain, moderate pain, fever or headache.   amoxicillin-clavulanate 875-125 MG tablet Commonly known as: AUGMENTIN Take 1 tablet by mouth 2 (two) times daily for 10 days.   apixaban 2.5 MG Tabs tablet Commonly known as: Eliquis Take 1 tablet (2.5 mg total) by mouth 2 (two)  times daily.   ciprofloxacin 0.3 % ophthalmic solution Commonly known as: CILOXAN Place 2 drops into both eyes every 4 (four) hours while awake. Administer 1 drop, every 2 hours, while awake, for 2 days. Then 1 drop, every 4 hours, while awake, for the next 5 days.   FeroSul 325 (65 Fe) MG tablet Generic drug: ferrous sulfate TAKE 1 TABLET EVERY OTHER DAY   finasteride 5 MG tablet Commonly known as: PROSCAR Take 1 tablet (5 mg total) by mouth daily.   FLUoxetine 20 MG capsule Commonly known as: PROZAC TAKE 1 CAPSULE EVERY DAY   folic acid 1 MG tablet Commonly known as: FOLVITE Take 1 tablet (1 mg total) by mouth daily.   furosemide 40 MG tablet Commonly known as: LASIX Take 1 tablet (40 mg total) by mouth daily. What changed: when to take this   Mens 50+ Multivitamin Tabs Take 1 tablet by mouth daily.   metoprolol tartrate 25 MG tablet Commonly known as: LOPRESSOR Take 12.5 mg by mouth See admin instructions. Take 12.5 mg (1/2 tablet) daily. May take an additional 1/2 tablet daily if needed for chest pain or elevated blood pressure.   pantoprazole 40 MG tablet Commonly known as: PROTONIX TAKE 1 TABLET EVERY MORNING   rosuvastatin 5 MG tablet Commonly known as: CRESTOR TAKE 1 TABLET EVERY DAY What changed: when to take this   senna-docusate 8.6-50 MG tablet Commonly known as: Stimulant Laxative Take 2 tablets by mouth 2 (two) times daily as needed for constipation   sodium bicarbonate 650 MG tablet Take 1 tablet (650 mg total) by mouth 2 (two) times daily.   Symbicort 160-4.5 MCG/ACT inhaler Generic drug: budesonide-formoterol Inhale 2 puffs into the lungs in the morning and at bedtime.   tamsulosin 0.4 MG Caps capsule Commonly known as: FLOMAX Take 1 capsule (0.4 mg total) by mouth daily with supper. What changed: when to take this   Zinc Oxide 12.8 % ointment Commonly known as: TRIPLE PASTE Apply topically 4 (four) times daily as needed (skin  barrier). What changed:  how much to take when to take this               Durable Medical Equipment  (From admission, onward)           Start     Ordered   03/10/23 1618  For home use only DME Air overlay mattress  Once       Comments: Patient has open sacral wound - 5cm x 1 cm - with wound care needs.   03/10/23 1619            Follow-up Information     [facility] Follow up.   Specialty: Home Health Services Why: [facility] will provide home health services.  They will call you in the next 24-48 hours to set up services. Contact information: [address] [place] [address] [phone]                Allergies  Allergen Reactions   Feldene [Piroxicam] Rash    Blistering rash     Neomycin-Polymyxin-Hc Itching   Statins Other (See Comments)    Myalgias   Latex Rash    Band Aid   Tequin [Gatifloxacin] Swelling and Anxiety   Valium [Diazepam] Other (See Comments)    Dizziness    Vibramycin [Doxycycline] Itching and Nausea Only    Discharge Exam: Vitals:   03/13/23 0718 03/13/23 1133  BP:  110/69  Pulse:  85  Resp:    Temp:  (!) 97.5 F (36.4 C)  SpO2: 97% 92%    Physical Exam Vitals and nursing note reviewed.  HENT:     Head: Normocephalic and atraumatic.     Nose: Nose normal.  Cardiovascular:     Rate and Rhythm: Normal rate and regular rhythm.  Pulmonary:     Effort: Pulmonary effort is normal.     Breath sounds: Normal breath sounds.  Abdominal:     General: Bowel sounds are normal.     Palpations: Abdomen is soft.  Musculoskeletal:     Comments: Pt has right lower leg brace in the corner of his room.  Skin:    General: Skin is warm and dry.     Capillary Refill: Capillary refill takes less than 2 seconds.  Neurological:     Mental Status: He is alert.     Comments: Hard of hearing     The results of significant diagnostics from this hospitalization (including imaging, microbiology,  ancillary and laboratory) are listed below for reference.    Microbiology: Recent Results (from the past 240 hour(s))  Resp panel by RT-PCR (RSV, Flu A&B, Covid) Anterior Nasal Swab     Status: None   Collection Time: 03/08/23 10:33 AM   Specimen: Anterior Nasal Swab  Result Value Ref Range Status   SARS Coronavirus 2 by RT PCR NEGATIVE NEGATIVE Final   Influenza A by PCR NEGATIVE NEGATIVE Final   Influenza B by PCR NEGATIVE NEGATIVE Final    Comment: (NOTE) The Xpert Xpress SARS-CoV-2/FLU/RSV plus assay is intended as an aid in the diagnosis of influenza from Nasopharyngeal swab specimens and should not be used as a sole basis for treatment. Nasal washings and aspirates are unacceptable for Xpert Xpress SARS-CoV-2/FLU/RSV testing.  Fact Sheet for Patients: BloggerCourse.com  Fact Sheet for Healthcare Providers: SeriousBroker.it  This test is not yet approved or cleared by the [place] FDA and has been authorized for detection and/or diagnosis of SARS-CoV-2 by FDA under an Emergency Use Authorization (EUA). This EUA will remain in effect (meaning this test can be used) for the duration of the COVID-19 declaration under Section 564(b)(1) of the Act, 21 U.S.C. section 360bbb-3(b)(1), unless the authorization is terminated or revoked.     Resp Syncytial Virus by PCR NEGATIVE NEGATIVE Final  Comment: (NOTE) Fact Sheet for Patients: BloggerCourse.com  Fact Sheet for Healthcare Providers: SeriousBroker.it  This test is not yet approved or cleared by the [place] FDA and has been authorized for detection and/or diagnosis of SARS-CoV-2 by FDA under an Emergency Use Authorization (EUA). This EUA will remain in effect (meaning this test can be used) for the duration of the COVID-19 declaration under Section 564(b)(1) of the Act, 21 U.S.C. section 360bbb-3(b)(1), unless the  authorization is terminated or revoked.  Performed at [facility], [address], [place], [place] [address]   Culture, blood (routine x 2)     Status: Abnormal (Preliminary result)   Collection Time: 03/08/23 10:45 AM   Specimen: BLOOD RIGHT FOREARM  Result Value Ref Range Status   Specimen Description BLOOD RIGHT FOREARM  Final   Special Requests   Final    BOTTLES DRAWN AEROBIC AND ANAEROBIC Blood Culture results may not be optimal due to an inadequate volume of blood received in culture bottles   Culture  Setup Time   Final    GRAM NEGATIVE RODS IN BOTH AEROBIC AND ANAEROBIC BOTTLES CRITICAL RESULT CALLED TO, READ BACK BY AND VERIFIED WITH: PHARMD MADISON OWEN ON 03/09/23 @ 1751 BY DRT    Culture (A)  Final    HAEMOPHILUS INFLUENZAE BETA LACTAMASE NEGATIVE HEALTH DEPARTMENT NOTIFIED Performed at [facility], [address], [place], [place] [address]    Report Status PENDING  Incomplete  Blood Culture ID Panel (Reflexed)     Status: Abnormal   Collection Time: 03/08/23 10:45 AM  Result Value Ref Range Status   Enterococcus faecalis NOT DETECTED NOT DETECTED Final   Enterococcus Faecium NOT DETECTED NOT DETECTED Final   Listeria monocytogenes NOT DETECTED NOT DETECTED Final   Staphylococcus species NOT DETECTED NOT DETECTED Final   Staphylococcus aureus (BCID) NOT DETECTED NOT DETECTED Final   Staphylococcus epidermidis NOT DETECTED NOT DETECTED Final   Staphylococcus lugdunensis NOT DETECTED NOT DETECTED Final   Streptococcus species NOT DETECTED NOT DETECTED Final   Streptococcus agalactiae NOT DETECTED NOT DETECTED Final   Streptococcus pneumoniae NOT DETECTED NOT DETECTED Final   Streptococcus pyogenes NOT DETECTED NOT DETECTED Final   A.calcoaceticus-baumannii NOT DETECTED NOT DETECTED Final   Bacteroides fragilis NOT DETECTED NOT DETECTED Final   Enterobacterales NOT DETECTED NOT DETECTED Final   Enterobacter cloacae complex NOT DETECTED NOT DETECTED  Final   Escherichia coli NOT DETECTED NOT DETECTED Final   Klebsiella aerogenes NOT DETECTED NOT DETECTED Final   Klebsiella oxytoca NOT DETECTED NOT DETECTED Final   Klebsiella pneumoniae NOT DETECTED NOT DETECTED Final   Proteus species NOT DETECTED NOT DETECTED Final   Salmonella species NOT DETECTED NOT DETECTED Final   Serratia marcescens NOT DETECTED NOT DETECTED Final   Haemophilus influenzae DETECTED (A) NOT DETECTED Final    Comment: CRITICAL RESULT CALLED TO, READ BACK BY AND VERIFIED WITH: PHARMD MADISON OWEN ON 03/09/23 @ 1751 BY DRT    Neisseria meningitidis NOT DETECTED NOT DETECTED Final   Pseudomonas aeruginosa NOT DETECTED NOT DETECTED Final   Stenotrophomonas maltophilia NOT DETECTED NOT DETECTED Final   Candida albicans NOT DETECTED NOT DETECTED Final   Candida auris NOT DETECTED NOT DETECTED Final   Candida glabrata NOT DETECTED NOT DETECTED Final   Candida krusei NOT DETECTED NOT DETECTED Final   Candida parapsilosis NOT DETECTED NOT DETECTED Final   Candida tropicalis NOT DETECTED NOT DETECTED Final   Cryptococcus neoformans/gattii NOT DETECTED NOT DETECTED Final  Comment: Performed at [facility], [address], [place], [place] [address]  Culture, blood (routine x 2)     Status: Abnormal   Collection Time: 03/08/23 10:50 AM   Specimen: BLOOD  Result Value Ref Range Status   Specimen Description BLOOD RIGHT ANTECUBITAL  Final   Special Requests   Final    BOTTLES DRAWN AEROBIC AND ANAEROBIC Blood Culture results may not be optimal due to an inadequate volume of blood received in culture bottles   Culture  Setup Time   Final    GRAM NEGATIVE RODS IN BOTH AEROBIC AND ANAEROBIC BOTTLES CRITICAL VALUE NOTED.  VALUE IS CONSISTENT WITH PREVIOUSLY REPORTED AND CALLED VALUE.    Culture HAEMOPHILUS INFLUENZAE BETA LACTAMASE NEGATIVE  (A)  Final   Report Status 03/11/2023 FINAL  Final  MRSA Next Gen by PCR, Nasal     Status: Abnormal   Collection Time:  03/10/23  3:18 PM   Specimen: Nasal Mucosa; Nasal Swab  Result Value Ref Range Status   MRSA by PCR Next Gen DETECTED (A) NOT DETECTED Final    Comment: RESULT CALLED TO, READ BACK BY AND VERIFIED WITH:  RN Chinita Greenland [id number] @ 2000 FH (NOTE) The GeneXpert MRSA Assay (FDA approved for NASAL specimens only), is one component of a comprehensive MRSA colonization surveillance program. It is not intended to diagnose MRSA infection nor to guide or monitor treatment for MRSA infections. Test performance is not FDA approved in patients less than 38 years old. Performed at [facility], [address], [place], [place] [address]   Culture, blood (Routine X 2) w Reflex to ID Panel     Status: None (Preliminary result)   Collection Time: 03/11/23 11:44 AM   Specimen: BLOOD RIGHT HAND  Result Value Ref Range Status   Specimen Description BLOOD RIGHT HAND  Final   Special Requests   Final    BOTTLES DRAWN AEROBIC AND ANAEROBIC Blood Culture results may not be optimal due to an inadequate volume of blood received in culture bottles   Culture   Final    NO GROWTH 2 DAYS Performed at [facility], [address], [place], [place] [address]    Report Status PENDING  Incomplete  Culture, blood (Routine X 2) w Reflex to ID Panel     Status: None (Preliminary result)   Collection Time: 03/11/23 11:45 AM   Specimen: BLOOD LEFT HAND  Result Value Ref Range Status   Specimen Description BLOOD LEFT HAND  Final   Special Requests   Final    BOTTLES DRAWN AEROBIC AND ANAEROBIC Blood Culture results may not be optimal due to an inadequate volume of blood received in culture bottles   Culture   Final    NO GROWTH 2 DAYS Performed at [facility], [address], [place], [place] [address]    Report Status PENDING  Incomplete     Labs: BNP (last 3 results) Recent Labs    09/27/22 2006 03/08/23 1130 03/13/23 0720  BNP 559.6* 847.7* 612.7*   Basic Metabolic Panel: Recent Labs   Lab 03/08/23 1130 03/08/23 1152 03/09/23 0659 03/10/23 0540 03/10/23 0541 03/11/23 0541 03/12/23 0724 03/13/23 0720  NA 135   < > 135  --  130* 131* 134* 134*  K 3.4*   < > 3.4*  --  2.9* 4.1 4.1 3.7  CL 104   < > 105  --  104 107 109 107  CO2 18*  --  20*  --  17* 16* 14* 19*  GLUCOSE 119*   < > 111*  --  105* 118* 112* 98  BUN 24*   < > 30*  --  32* 20 12 11   CREATININE 1.53*   < > 1.50*  --  1.41* 1.25* 1.09 1.08  CALCIUM 9.1  --  8.6*  --  8.3* 8.3* 8.8* 8.3*  MG 1.7  --   --  1.7  --   --   --  1.9   < > = values in this interval not displayed.   Liver Function Tests: Recent Labs  Lab 03/08/23 1130 03/09/23 0659 03/13/23 0720  AST 18 19 16   ALT 10 11 13   ALKPHOS 72 64 76  BILITOT 2.6* 1.5* 0.9  PROT 6.9 5.8* 6.0*  ALBUMIN 3.2* 2.6* 2.4*   CBC: Recent Labs  Lab 03/08/23 1033 03/08/23 1152 03/09/23 0659 03/10/23 0541 03/11/23 0541 03/12/23 0724 03/13/23 0720  WBC 20.6*  --  12.6* 9.0 8.1 8.9 7.4  NEUTROABS 17.8*  --   --   --   --   --  5.1  HGB 10.4*   < > 9.2* 8.7* 8.3* 9.2* 8.9*  HCT 32.2*   < > 27.2* 25.5* 24.6* 27.3* 26.7*  MCV 103.2*  --  100.0 99.6 99.2 100.7* 99.6  PLT 80*  --  67* 66* 65* 66* 76*   < > = values in this interval not displayed.   BNP: Recent Labs  Lab 03/08/23 1130 03/13/23 0720  BNP 847.7* 612.7*   CBG: Recent Labs  Lab 03/08/23 1030  GLUCAP 127*   Urinalysis    Component Value Date/Time   COLORURINE AMBER (A) 03/08/2023 1142   APPEARANCEUR CLOUDY (A) 03/08/2023 1142   LABSPEC 1.015 03/08/2023 1142   PHURINE 5.0 03/08/2023 1142   GLUCOSEU NEGATIVE 03/08/2023 1142   HGBUR MODERATE (A) 03/08/2023 1142   BILIRUBINUR NEGATIVE 03/08/2023 1142   KETONESUR NEGATIVE 03/08/2023 1142   PROTEINUR 100 (A) 03/08/2023 1142   UROBILINOGEN 0.2 07/13/2014 1804   NITRITE NEGATIVE 03/08/2023 1142   LEUKOCYTESUR LARGE (A) 03/08/2023 1142   Sepsis Labs Recent Labs  Lab 03/10/23 0541 03/11/23 0541 03/12/23 0724  03/13/23 0720  WBC 9.0 8.1 8.9 7.4   Microbiology Recent Results (from the past 240 hour(s))  Resp panel by RT-PCR (RSV, Flu A&B, Covid) Anterior Nasal Swab     Status: None   Collection Time: 03/08/23 10:33 AM   Specimen: Anterior Nasal Swab  Result Value Ref Range Status   SARS Coronavirus 2 by RT PCR NEGATIVE NEGATIVE Final   Influenza A by PCR NEGATIVE NEGATIVE Final   Influenza B by PCR NEGATIVE NEGATIVE Final    Comment: (NOTE) The Xpert Xpress SARS-CoV-2/FLU/RSV plus assay is intended as an aid in the diagnosis of influenza from Nasopharyngeal swab specimens and should not be used as a sole basis for treatment. Nasal washings and aspirates are unacceptable for Xpert Xpress SARS-CoV-2/FLU/RSV testing.  Fact Sheet for Patients: BloggerCourse.com  Fact Sheet for Healthcare Providers: SeriousBroker.it  This test is not yet approved or cleared by the [place] FDA and has been authorized for detection and/or diagnosis of SARS-CoV-2 by FDA under an Emergency Use Authorization (EUA). This EUA will remain in effect (meaning this test can be used) for the duration of the COVID-19 declaration under Section 564(b)(1) of the Act, 21 U.S.C. section 360bbb-3(b)(1), unless the authorization is terminated or revoked.     Resp Syncytial Virus by PCR  NEGATIVE NEGATIVE Final    Comment: (NOTE) Fact Sheet for Patients: BloggerCourse.com  Fact Sheet for Healthcare Providers: SeriousBroker.it  This test is not yet approved or cleared by the [place] FDA and has been authorized for detection and/or diagnosis of SARS-CoV-2 by FDA under an Emergency Use Authorization (EUA). This EUA will remain in effect (meaning this test can be used) for the duration of the COVID-19 declaration under Section 564(b)(1) of the Act, 21 U.S.C. section 360bbb-3(b)(1), unless the authorization is  terminated or revoked.  Performed at [facility], [address], [place], [place] [address]   Culture, blood (routine x 2)     Status: Abnormal (Preliminary result)   Collection Time: 03/08/23 10:45 AM   Specimen: BLOOD RIGHT FOREARM  Result Value Ref Range Status   Specimen Description BLOOD RIGHT FOREARM  Final   Special Requests   Final    BOTTLES DRAWN AEROBIC AND ANAEROBIC Blood Culture results may not be optimal due to an inadequate volume of blood received in culture bottles   Culture  Setup Time   Final    GRAM NEGATIVE RODS IN BOTH AEROBIC AND ANAEROBIC BOTTLES CRITICAL RESULT CALLED TO, READ BACK BY AND VERIFIED WITH: PHARMD MADISON OWEN ON 03/09/23 @ 1751 BY DRT    Culture (A)  Final    HAEMOPHILUS INFLUENZAE BETA LACTAMASE NEGATIVE HEALTH DEPARTMENT NOTIFIED Performed at [facility], [address], [place], [place] [address]    Report Status PENDING  Incomplete  Blood Culture ID Panel (Reflexed)     Status: Abnormal   Collection Time: 03/08/23 10:45 AM  Result Value Ref Range Status   Enterococcus faecalis NOT DETECTED NOT DETECTED Final   Enterococcus Faecium NOT DETECTED NOT DETECTED Final   Listeria monocytogenes NOT DETECTED NOT DETECTED Final   Staphylococcus species NOT DETECTED NOT DETECTED Final   Staphylococcus aureus (BCID) NOT DETECTED NOT DETECTED Final   Staphylococcus epidermidis NOT DETECTED NOT DETECTED Final   Staphylococcus lugdunensis NOT DETECTED NOT DETECTED Final   Streptococcus species NOT DETECTED NOT DETECTED Final   Streptococcus agalactiae NOT DETECTED NOT DETECTED Final   Streptococcus pneumoniae NOT DETECTED NOT DETECTED Final   Streptococcus pyogenes NOT DETECTED NOT DETECTED Final   A.calcoaceticus-baumannii NOT DETECTED NOT DETECTED Final   Bacteroides fragilis NOT DETECTED NOT DETECTED Final   Enterobacterales NOT DETECTED NOT DETECTED Final   Enterobacter cloacae complex NOT DETECTED NOT DETECTED Final    Escherichia coli NOT DETECTED NOT DETECTED Final   Klebsiella aerogenes NOT DETECTED NOT DETECTED Final   Klebsiella oxytoca NOT DETECTED NOT DETECTED Final   Klebsiella pneumoniae NOT DETECTED NOT DETECTED Final   Proteus species NOT DETECTED NOT DETECTED Final   Salmonella species NOT DETECTED NOT DETECTED Final   Serratia marcescens NOT DETECTED NOT DETECTED Final   Haemophilus influenzae DETECTED (A) NOT DETECTED Final    Comment: CRITICAL RESULT CALLED TO, READ BACK BY AND VERIFIED WITH: PHARMD MADISON OWEN ON 03/09/23 @ 1751 BY DRT    Neisseria meningitidis NOT DETECTED NOT DETECTED Final   Pseudomonas aeruginosa NOT DETECTED NOT DETECTED Final   Stenotrophomonas maltophilia NOT DETECTED NOT DETECTED Final   Candida albicans NOT DETECTED NOT DETECTED Final   Candida auris NOT DETECTED NOT DETECTED Final   Candida glabrata NOT DETECTED NOT DETECTED Final   Candida krusei NOT DETECTED NOT DETECTED Final   Candida parapsilosis NOT DETECTED NOT DETECTED Final   Candida tropicalis NOT DETECTED NOT DETECTED Final   Cryptococcus neoformans/gattii  NOT DETECTED NOT DETECTED Final    Comment: Performed at [facility], [address] [place], [place] [address]  Culture, blood (routine x 2)     Status: Abnormal   Collection Time: 03/08/23 10:50 AM   Specimen: BLOOD  Result Value Ref Range Status   Specimen Description BLOOD RIGHT ANTECUBITAL  Final   Special Requests   Final    BOTTLES DRAWN AEROBIC AND ANAEROBIC Blood Culture results may not be optimal due to an inadequate volume of blood received in culture bottles   Culture  Setup Time   Final    GRAM NEGATIVE RODS IN BOTH AEROBIC AND ANAEROBIC BOTTLES CRITICAL VALUE NOTED.  VALUE IS CONSISTENT WITH PREVIOUSLY REPORTED AND CALLED VALUE.    Culture HAEMOPHILUS INFLUENZAE BETA LACTAMASE NEGATIVE  (A)  Final   Report Status 03/11/2023 FINAL  Final  MRSA Next Gen by PCR, Nasal     Status: Abnormal   Collection Time: 03/10/23   3:18 PM   Specimen: Nasal Mucosa; Nasal Swab  Result Value Ref Range Status   MRSA by PCR Next Gen DETECTED (A) NOT DETECTED Final    Comment: RESULT CALLED TO, READ BACK BY AND VERIFIED WITH:  RN Chinita Greenland [phone] @ 2000 FH (NOTE) The GeneXpert MRSA Assay (FDA approved for NASAL specimens only), is one component of a comprehensive MRSA colonization surveillance program. It is not intended to diagnose MRSA infection nor to guide or monitor treatment for MRSA infections. Test performance is not FDA approved in patients less than 50 years old. Performed at [facility], [address] [place], [place] [address]   Culture, blood (Routine X 2) w Reflex to ID Panel     Status: None (Preliminary result)   Collection Time: 03/11/23 11:44 AM   Specimen: BLOOD RIGHT HAND  Result Value Ref Range Status   Specimen Description BLOOD RIGHT HAND  Final   Special Requests   Final    BOTTLES DRAWN AEROBIC AND ANAEROBIC Blood Culture results may not be optimal due to an inadequate volume of blood received in culture bottles   Culture   Final    NO GROWTH 2 DAYS Performed at [facility], [address], [place], [place] [address]    Report Status PENDING  Incomplete  Culture, blood (Routine X 2) w Reflex to ID Panel     Status: None (Preliminary result)   Collection Time: 03/11/23 11:45 AM   Specimen: BLOOD LEFT HAND  Result Value Ref Range Status   Specimen Description BLOOD LEFT HAND  Final   Special Requests   Final    BOTTLES DRAWN AEROBIC AND ANAEROBIC Blood Culture results may not be optimal due to an inadequate volume of blood received in culture bottles   Culture   Final    NO GROWTH 2 DAYS Performed at [facility], [address], [place], [place] [address]    Report Status PENDING  Incomplete    Procedures/Studies: CT CHEST WO CONTRAST  Result Date: 03/13/2023 CLINICAL DATA:  Pneumonia. EXAM: CT CHEST WITHOUT CONTRAST TECHNIQUE: Multidetector CT imaging of  the chest was performed following the standard protocol without IV contrast. RADIATION DOSE REDUCTION: This exam was performed according to the departmental dose-optimization program which includes automated exposure control, adjustment of the mA and/or kV according to patient size and/or use of iterative reconstruction technique. COMPARISON:  March 08, 2023. FINDINGS: Cardiovascular: Status post coronary artery bypass graft. No pericardial effusion. Mild cardiomegaly. Mediastinum/Nodes:  Thyroid gland is unremarkable. Esophagus is unremarkable. Stable 10 mm right paratracheal lymph node. Stable 10 mm precarinal lymph node. Stable 12 mm subcarinal lymph node. Lungs/Pleura: No pneumothorax is noted. Small bilateral pleural effusions are noted, right greater than left. Visualization of lung fields is limited due to respiratory motion artifact. Right lower lobe airspace opacity is noted concerning for pneumonia. Reticular densities are noted in both lung bases which may represent scarring or possibly edema. Upper Abdomen: No acute abnormality. Musculoskeletal: No chest wall mass or suspicious bone lesions identified. IMPRESSION: Small bilateral pleural effusions are noted, right greater than left. Visualization of lung fields is limited due to respiratory motion artifact. Right lower lobe airspace opacity is noted most consistent with pneumonia. Reticular densities are noted in both lung bases which may represent scarring or possibly edema. Mildly enlarged mediastinal adenopathy is noted which most likely is reactive or inflammatory in etiology. Aortic Atherosclerosis (ICD10-I70.0). Electronically Signed   By: Lupita Raider M.D.   On: 03/13/2023 14:57   DG Chest Port 1 View  Result Date: 03/11/2023 CLINICAL DATA:  Pneumonia EXAM: PORTABLE CHEST 1 VIEW COMPARISON:  Chest radiograph dated 03/08/2023. FINDINGS: The heart is enlarged. Vascular calcifications are seen in the aortic arch. Moderate bilateral  interstitial and airspace opacities appear increased from prior exam. A small right pleural effusion may contribute. No left pleural effusion. No pneumothorax. Degenerative changes are seen in the spine. IMPRESSION: Moderate bilateral interstitial and airspace opacities appear increased from prior exam. Electronically Signed   By: Romona Curls M.D.   On: 03/11/2023 10:48   ECHOCARDIOGRAM COMPLETE  Result Date: 03/10/2023    ECHOCARDIOGRAM REPORT   Patient Name:   Evan [name] Date of Exam: 03/10/2023 Medical Rec #:  [id number]    Height:       63.0 in Accession #:    [id number]   Weight:       143.5 lb Date of Birth:  May 08, 1927    BSA:          1.679 m Patient Age:    89 years     BP:           108/61 mmHg Patient Gender: M            HR:           77 bpm. Exam Location:  Inpatient Procedure: 2D Echo, Cardiac Doppler and Color Doppler Indications:    Cardiomegaly  History:        Patient has prior history of Echocardiogram examinations, most                 recent 04/28/2022. CAD, Prior CABG, Stroke and COPD, Aortic Valve                 Disease, Arrythmias:Atrial Fibrillation,                 Signs/Symptoms:Hypotension, Altered Mental Status and Shortness                 of Breath; Risk Factors:Hypertension and Dyslipidemia. Dementia,                 lung CA.  Sonographer:    Milda Smart Referring Phys: [id number] RAMESH KC  Sonographer Comments: Image acquisition challenging due to patient body habitus. IMPRESSIONS  1. Left ventricular ejection fraction, by estimation, is 60 to 65%. The left ventricle has normal function. The left ventricle has no regional wall motion abnormalities. There is mild left ventricular hypertrophy.  Left ventricular diastolic parameters are indeterminate.  2. Right ventricular systolic function is normal. The right ventricular size is normal. There is moderately elevated pulmonary artery systolic pressure. The estimated right ventricular systolic pressure is 54.8 mmHg.  3. Left  atrial size was severely dilated.  4. Right atrial size was moderately dilated.  5. The mitral valve is normal in structure. Mild mitral valve regurgitation.  6. Tricuspid valve regurgitation is moderate.  7. The aortic valve is tricuspid. Aortic valve regurgitation is trivial. Aortic valve sclerosis/calcification is present, without any evidence of aortic stenosis.  8. The inferior vena cava is normal in size with greater than 50% respiratory variability, suggesting right atrial pressure of 3 mmHg. FINDINGS  Left Ventricle: Left ventricular ejection fraction, by estimation, is 60 to 65%. The left ventricle has normal function. The left ventricle has no regional wall motion abnormalities. The left ventricular internal cavity size was normal in size. There is  mild left ventricular hypertrophy. Abnormal (paradoxical) septal motion consistent with post-operative status. Left ventricular diastolic parameters are indeterminate. Right Ventricle: The right ventricular size is normal. Right ventricular systolic function is normal. There is moderately elevated pulmonary artery systolic pressure. The tricuspid regurgitant velocity is 3.60 m/s, and with an assumed right atrial pressure of 3 mmHg, the estimated right ventricular systolic pressure is 54.8 mmHg. Left Atrium: Left atrial size was severely dilated. Right Atrium: Right atrial size was moderately dilated. Pericardium: There is no evidence of pericardial effusion. Mitral Valve: The mitral valve is normal in structure. Mild mitral valve regurgitation. Tricuspid Valve: Tricuspid valve regurgitation is moderate. Aortic Valve: The aortic valve is tricuspid. Aortic valve regurgitation is trivial. Aortic valve sclerosis/calcification is present, without any evidence of aortic stenosis. Pulmonic Valve: Pulmonic valve regurgitation is not visualized. Aorta: The aortic root and ascending aorta are structurally normal, with no evidence of dilitation. Venous: The inferior vena  cava is normal in size with greater than 50% respiratory variability, suggesting right atrial pressure of 3 mmHg. IAS/Shunts: The interatrial septum was not well visualized.  LEFT VENTRICLE PLAX 2D LVIDd:         4.40 cm   Diastology LVIDs:         3.00 cm   LV e' medial:    4.88 cm/s LV PW:         1.10 cm   LV E/e' medial:  1.0 LV IVS:        1.10 cm   LV e' lateral:   8.73 cm/s LVOT diam:     2.10 cm   LV E/e' lateral: 0.6 LV SV:         44 LV SV Index:   26 LVOT Area:     3.46 cm  RIGHT VENTRICLE            IVC RV S prime:     9.67 cm/s  IVC diam: 2.20 cm TAPSE (M-mode): 1.7 cm LEFT ATRIUM              Index        RIGHT ATRIUM           Index LA diam:        4.10 cm  2.44 cm/m   RA Area:     16.90 cm LA Vol (A2C):   106.0 ml 63.12 ml/m  RA Volume:   44.70 ml  26.62 ml/m LA Vol (A4C):   108.0 ml 64.31 ml/m LA Biplane Vol: 110.0 ml 65.50 ml/m  AORTIC VALVE LVOT Vmax:  55.60 cm/s LVOT Vmean:  40.900 cm/s LVOT VTI:    0.128 m  AORTA Ao Root diam: 3.60 cm Ao Asc diam:  3.70 cm MR Peak grad: 97.2 mmHg   TRICUSPID VALVE MR Vmax:      493.00 cm/s TR Peak grad:   51.8 mmHg MV E velocity: 4.88 cm/s  TR Vmax:        360.00 cm/s                            SHUNTS                           Systemic VTI:  0.13 m                           Systemic Diam: 2.10 cm Carolan Clines Electronically signed by Carolan Clines Signature Date/Time: 03/10/2023/3:18:32 PM    Final    DG Swallowing Func-Speech Pathology  Result Date: 03/10/2023 Table formatting from the original result was not included. Modified Barium Swallow Study Patient Details Name: Evan [name] MRN: [id number] Date of Birth: 08-07-1927 Today's Date: 03/10/2023 HPI/PMH: HPI: 89 y.o. male presenting with shortness of breath and altered mental status. He was exposed to 2 people who were found later to have pneumonia at Thanksgiving.  Now, for the past 2 days patient has had a low-grade fever around 99.5 and has been reporting some shortness of breath as well as orthopnea  and oral intake.  He has been coughing when drinking at home and at the hospital.  CT shows right lower lobe consolidation, groundglass opacities and interstitial thickening similar to prior. Prior MBS in 2023 shows sensed aspriation of thin liquids, improved with small sips. Daughter reports pt eats and drinks too fast at home.  Pt with medical history significant of hypertension, hyperlipidemia, GERD, CKD 3, BPH, anemia, atrial fibrillation, subdural hematoma, CVA, CAD status post CABG, QT prolongation, dementia, sundowning, hearing loss, anxiety, depression, chronic respiratory failure with hypoxia, COPD, lung cancer, radiation pneumonitis, rheumatoid arthritis, Per family he is hard of hearing. Clinical Impression: Clinical Impression: Pt demonstrates very functional swallowing ability with mild oral dysphagia characterized by prolonged mastication, lingual rocking with solids, residue in the floor of mouth and residue around teeth with a sticky solid. Pt was able to clear residue with verbal cues. Though boluses spilled to pyriform sinsues prior to laryngeal elevation there was only one instance of trace frank silent penetration with thin liquids. PAS4. Pt did not cough during study as he did in the room. Suspect upright position in the chair was significantly beneficial. Pt was cheerful and participatory though hard of hearing. Recommend continuing nectar thick liquids while in the hospital since positioning is often suboptimal. Told daughter to resume thin liquids and foods of choice (unrestricted regular) with upright posture after return home. Cues for oral clearance helpful. Will report to daughter. Factors that may increase risk of adverse event in presence of aspiration Rubye Oaks [employer] Clearance Coots 2021): No data recorded Recommendations/Plan: Swallowing Evaluation Recommendations Swallowing Evaluation Recommendations Recommendations: PO diet PO Diet Recommendation: Regular; Thin liquids (Level 0); Mildly thick  liquids (Level 2, nectar thick) Liquid Administration via: Cup; Straw Medication Administration: Whole meds with puree Supervision: Staff to assist with self-feeding Swallowing strategies  : Slow rate; Small bites/sips Postural changes: Position pt fully upright for meals Oral care recommendations: Oral care BID (2x/day) Treatment  Plan Treatment Plan Treatment recommendations: No treatment recommended at this time Recommendations Recommendations for follow up therapy are one component of a multi-disciplinary discharge planning process, led by the attending physician.  Recommendations may be updated based on patient status, additional functional criteria and insurance authorization. Assessment: Orofacial Exam: Orofacial Exam Oral Cavity - Dentition: Adequate natural dentition Anatomy: Anatomy: WFL Boluses Administered: Boluses Administered Boluses Administered: Thin liquids (Level 0); Mildly thick liquids (Level 2, nectar thick); Puree; Solid  Oral Impairment Domain: Oral Impairment Domain Lip Closure: No labial escape Tongue control during bolus hold: Not tested Bolus preparation/mastication: Slow prolonged chewing/mashing with complete recollection Bolus transport/lingual motion: Slow tongue motion Oral residue: Residue collection on oral structures Location of oral residue : Floor of mouth Initiation of pharyngeal swallow : Pyriform sinuses  Pharyngeal Impairment Domain: Pharyngeal Impairment Domain Soft palate elevation: No bolus between soft palate (SP)/pharyngeal wall (PW) Laryngeal elevation: Complete superior movement of thyroid cartilage with complete approximation of arytenoids to epiglottic petiole Anterior hyoid excursion: Complete anterior movement Epiglottic movement: Complete inversion Laryngeal vestibule closure: Complete, no air/contrast in laryngeal vestibule Pharyngeal stripping wave : Present - complete Pharyngeal contraction (A/P view only): N/A Pharyngoesophageal segment opening: Complete  distension and complete duration, no obstruction of flow Tongue base retraction: No contrast between tongue base and posterior pharyngeal wall (PPW) Pharyngeal residue: Complete pharyngeal clearance  Esophageal Impairment Domain: No data recorded Pill: No data recorded Penetration/Aspiration Scale Score: Penetration/Aspiration Scale Score 1.  Material does not enter airway: Thin liquids (Level 0); Mildly thick liquids (Level 2, nectar thick); Solid; Puree; Moderately thick liquids (Level 3, honey thick) 5.  Material enters airway, CONTACTS cords and not ejected out: Thin liquids (Level 0) Compensatory Strategies: No data recorded  General Information: Caregiver present: No  Diet Prior to this Study: Regular; Mildly thick liquids (Level 2, nectar thick)   Temperature : Normal   Respiratory Status: WFL   Supplemental O2: None (Room air)   No data recorded Behavior/Cognition: Alert; Cooperative; Pleasant mood Self-Feeding Abilities: Able to self-feed Baseline vocal quality/speech: Normal Volitional Cough: Able to elicit Volitional Swallow: Able to elicit Exam Limitations: No limitations Goal Planning: Prognosis for improved oropharyngeal function: Guarded No data recorded No data recorded No data recorded No data recorded Pain: Pain Assessment Pain Assessment: Faces Faces Pain Scale: 4 Pain Location: RUE Pain Descriptors / Indicators: Aching; Discomfort; Guarding Pain Intervention(s): Monitored during session; Repositioned; Limited activity within patient's tolerance End of Session: Start Time:SLP Start Time (ACUTE ONLY): 1200 Stop Time: SLP Stop Time (ACUTE ONLY): 1220 Time Calculation:SLP Time Calculation (min) (ACUTE ONLY): 20 min Charges: SLP Evaluations $ SLP Speech Visit: 1 Visit SLP Evaluations $BSS Swallow: 1 Procedure $MBS Swallow: 1 Procedure $Swallowing Treatment: 1 Procedure SLP visit diagnosis: SLP Visit Diagnosis: Dysphagia, oropharyngeal phase (R13.12) Past Medical History: Past Medical History:  Diagnosis Date  Acute blood loss anemia   Acute bronchitis 05/15/2017  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 07/14/2018  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  Altered mental status 08/21/2018  Anginal chest pain at rest [facility])   Chronic non, controlled on Lorazepam  Anxiety   Arthritis   "all over"   BPH (benign prostatic hyperplasia)   CAD (coronary artery disease)   Cellulitis of arm, right 06/01/2018  Cellulitis of left axilla   Chronic lower back pain   Complication of anesthesia   "problems making water afterwards"  Depression   DJD (degenerative joint disease)   Esophageal ulcer without bleeding  bid ppi indefinitely  Fall 07/16/2018  Family history of adverse reaction to anesthesia   "children w/PONV"  GERD (gastroesophageal reflux disease)   Headache   "couple/week maybe" (09/04/2015)  Hemochromatosis   Possible, elevated iron stores, hook-like osteophytes on hand films, normal LFTs  Hepatitis   "yellow jaundice as a baby"  History of ASCVD   MULTIVESSEL  Hyperlipidemia   Hypertension   Hypotension   Mild aortic stenosis 06/23/2021  Osteoarthritis   Pneumonia   "several times; got a little now" (09/04/2015)  Rash and nonspecific skin eruption 06/01/2018  Rheumatoid arthritis (HCC)   RSV (respiratory syncytial virus pneumonia) 05/15/2022  Subdural hematoma (HCC)   small after fall 09/2016-plavix held, neurosurgery consulted.  Asymptomatic.    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 since he failed PO Eliquis Past Surgical History: Past Surgical History: Procedure Laterality Date  ARM SKIN LESION BIOPSY / EXCISION Left 09/02/2015  CATARACT EXTRACTION W/ INTRAOCULAR LENS  IMPLANT, BILATERAL Bilateral   CHOLECYSTECTOMY OPEN    CORNEAL TRANSPLANT Bilateral   "one at [facility]; one at [facility]"  CORONARY ANGIOPLASTY WITH STENT PLACEMENT    CORONARY ARTERY BYPASS GRAFT  1999  DESCENDING AORTIC ANEURYSM REPAIR W/ STENT    DE stent ostium into  the right radial free graft at OM-, 02-2007  ESOPHAGOGASTRODUODENOSCOPY N/A 11/09/2014  Procedure: ESOPHAGOGASTRODUODENOSCOPY (EGD);  Surgeon: Dorena Cookey, MD;  Location: [facility] ENDOSCOPY;  Service: Endoscopy;  Laterality: N/A;  INGUINAL HERNIA REPAIR    JOINT REPLACEMENT    LEFT HEART CATH AND CORS/GRAFTS ANGIOGRAPHY N/A 06/27/2017  Procedure: LEFT HEART CATH AND CORS/GRAFTS ANGIOGRAPHY;  Surgeon: Lennette Bihari, MD;  Location: [facility] INVASIVE CV LAB;  Service: Cardiovascular;  Laterality: N/A;  NASAL SINUS SURGERY    SHOULDER OPEN ROTATOR CUFF REPAIR Right   TOTAL KNEE ARTHROPLASTY Bilateral  DeBlois, Riley Nearing 03/10/2023, 1:30 PM  CT Angio Chest PE W and/or Wo Contrast  Result Date: 03/08/2023 CLINICAL DATA:  Cough and low-grade fever. EXAM: CT ANGIOGRAPHY CHEST WITH CONTRAST TECHNIQUE: Multidetector CT imaging of the chest was performed using the standard protocol during bolus administration of intravenous contrast. Multiplanar CT image reconstructions and MIPs were obtained to evaluate the vascular anatomy. RADIATION DOSE REDUCTION: This exam was performed according to the departmental dose-optimization program which includes automated exposure control, adjustment of the mA and/or kV according to patient size and/or use of iterative reconstruction technique. CONTRAST:  75mL OMNIPAQUE IOHEXOL 350 MG/ML SOLN COMPARISON:  04/28/2022 FINDINGS: Cardiovascular: Pulmonary arteries are well opacified. There is chronic opacification of a segmental branch to the posterior basilar segment of the right lower lobe, unchanged from the prior CTA. No evidence of an acute pulmonary embolism. Stable changes from previous CABG surgery. Three-vessel coronary artery calcifications. No pericardial effusion. Heart mildly enlarged. Dilated right pulmonary artery to 3.4 cm. Aorta is normal in caliber. Stable aortic atherosclerotic calcifications. Mediastinum/Nodes: No neck base, mediastinal or hilar masses. Mild mediastinal and right  hilar adenopathy. Superior right paratracheal mediastinal lymph node measures 1.4 cm in short axis. Azygous level right paratracheal lymph node measures 1 cm short axis. Shotty subcarinal lymph node approximally 1.4 cm short axis. These nodes are stable from the prior CT. Mild right hilar adenopathy, nodes up to 1.4 cm in short axis, similar to the prior CT. Trachea and esophagus are unremarkable. Lungs/Pleura: Ground-glass and confluent consolidation throughout most of the right lower lobe. Milder ground-glass opacity noted in the peripheral right upper lobes and left lower lobe. Interstitial thickening noted bilaterally. Trace right pleural  effusion.  No left pleural effusion. No pneumothorax. Upper Abdomen: No acute findings. Musculoskeletal: Moderate compression fracture of L1, chronic and stable from a chest CT dated 03/22/2022. No acute fractures. No bone lesions. No chest wall mass. Review of the MIP images confirms the above findings. IMPRESSION: 1. No evidence of an acute pulmonary embolism. Small chronic segmental pulmonary embolism to the posterior basilar segment of the right lower lobe stable from the prior CTA. 2. Right lower lobe consolidation consistent with pneumonia. 3. Other ground-glass lung opacities and interstitial thickening are similar to the prior CT. Consider underlying mild congestive heart failure with pulmonary edema as well as chronic interstitial lung disease in the differential. 4. Stable mild mediastinal right hilar lymphadenopathy, presumed reactive. 5. Stable mild cardiomegaly, stable changes from prior CABG surgery, coronary artery calcifications and aortic atherosclerosis. Aortic Atherosclerosis (ICD10-I70.0). Electronically Signed   By: Amie Portland M.D.   On: 03/08/2023 16:02   DG Chest 2 View  Result Date: 03/08/2023 CLINICAL DATA:  Cough, hemoptysis, fever. EXAM: CHEST - 2 VIEW COMPARISON:  Radiographs 09/28/2022 and 09/27/2022. Chest CTA 04/28/2022. FINDINGS: 1059  hours. The heart size and mediastinal contours are stable status post median sternotomy and CABG. There is mild cardiomegaly and aortic atherosclerosis. Chronic central airway thickening with possible recurrent airspace disease at the right lung base. Trace bilateral pleural effusions are evident on the lateral view. No evidence of pneumothorax. Chronic L1 compression fracture appears unchanged from lumbar MRI 09/06/2019. No acute osseous findings are identified. Lucency laterally in the right upper quadrant the abdomen on the frontal examination corresponds with bowel on the lateral view. No definite pneumoperitoneum. IMPRESSION: 1. Possible recurrent airspace disease at the right lung base, suspicious for pneumonia. Trace bilateral pleural effusions. 2. Chronic L1 compression fracture. Electronically Signed   By: Carey Bullocks M.D.   On: 03/08/2023 11:38    Time coordinating discharge: 45 mins  SIGNED:  Carollee Herter, DO [facility] 03/13/23, 3:27 PM

## 2023-03-13 NOTE — Progress Notes (Signed)
PROGRESS NOTE    Evan [name]  KGM:[id number] DOB: 12/05/1927 DOA: 03/08/2023 PCP: Donita Brooks, MD  Subjective: Pt seen and examined. Met with pt's dtr Evan [name] at bedside. She confirms that her brother or she stays with patient in his own home. Pt remains on RA.  Potential for DC later today if CT chest shows improvement in pneumonia.   Hospital Course: HPI: 89 y.o. male with medical history significant of hypertension, hyperlipidemia, GERD, CKD 3, BPH, anemia, atrial fibrillation, subdural hematoma, CVA, CAD status post CABG, QT prolongation, dementia, sundowning, hearing loss, anxiety, depression, chronic respiratory failure with hypoxia, COPD, lung cancer, radiation pneumonitis, rheumatoid arthritis, PE presenting with shortness of breath and altered mental status.  Per family he is hard of hearing, baseline dementia and was exposed to 2 people who were found later to have pneumonia at Thanksgiving.  Now, for the past 2 days patient has had a low-grade fever around 99.5 and has been reporting some shortness of breath as well as orthopnea and oral intake.  He has baseline tremors and has been worse past 2 days. In ED  BP- 110s to 130s systolic, respiratory rate in the 20s to 30s.  Labs>> mild hypokalemia creatinine stable at 1.53, albumin 3.2, T. bili 2.6. Leukocytosis 20.6, hemoglobin stable at 10.4, platelets stable at 80.  BNP elevated to 847.  Lactic acid borderline at 2.1 and repeat  1.5 RVP panel for flu COVID and RSV negative. Blood cultures sent CXR>>possible recurrent airspace disease suspicious for pneumonia and chronic L1 compression fracture noted. CTA PE>> no acute PE, small chronic segmental PE stable from prior CTA, right lower lobe consolidation, groundglass opacities and interstitial thickening similar to prior CT Patient is admitted for further management  Significant Events: Admitted 03/08/2023 for pneumonia   Significant Labs: Admission WBC 20.6, HgB 10.4, Plt  80, Na 135, K 3.4, Bicarb 18, BUN 24, Scr 1.53 03-08-2023 Blood cx positive for Haemophilus influenzae  Significant Imaging Studies: Admission CTPA shows No evidence of an acute pulmonary embolism. Small chronic segmental pulmonary embolism to the posterior basilar segment of the right lower lobe stable from the prior CTA. 2. Right lower lobe consolidation consistent with pneumonia. 3. Other ground-glass lung opacities and interstitial thickening are similar to the prior CT. Consider underlying mild congestive heart failure with pulmonary edema as well as chronic interstitial lung disease in the differential. 4. Stable mild mediastinal right hilar lymphadenopathy, presumed reactive. 5. Stable mild cardiomegaly, stable changes from prior CABG surgery, coronary artery calcifications and aortic atherosclerosis. 03-10-2023 MBS shows Pt demonstrates very functional swallowing ability with mild oral dysphagia characterized by prolonged mastication, lingual rocking with solids, residue in the floor of mouth and residue around teeth with a sticky solid  03-10-2023 echo shows LVEF 60%  Antibiotic Therapy: Anti-infectives (From admission, onward)    Start     Dose/Rate Route Frequency Ordered Stop   03/11/23 1000  cefTRIAXone (ROCEPHIN) 2 g in sodium chloride 0.9 % 100 mL IVPB        2 g 200 mL/hr over 30 Minutes Intravenous Every 24 hours 03/10/23 1026     03/10/23 1115  cefTRIAXone (ROCEPHIN) 1 g in sodium chloride 0.9 % 100 mL IVPB        1 g 200 mL/hr over 30 Minutes Intravenous  Once 03/10/23 1026 03/10/23 1215   03/09/23 1000  cefTRIAXone (ROCEPHIN) 1 g in sodium chloride 0.9 % 100 mL IVPB  Status:  Discontinued  1 g 200 mL/hr over 30 Minutes Intravenous Every 24 hours 03/08/23 1556 03/08/23 1652   03/09/23 1000  cefTRIAXone (ROCEPHIN) 1 g in sodium chloride 0.9 % 100 mL IVPB  Status:  Discontinued        1 g 200 mL/hr over 30 Minutes Intravenous Every 24 hours 03/08/23 1652 03/10/23 1026    03/08/23 2200  doxycycline (VIBRA-TABS) tablet 100 mg  Status:  Discontinued        100 mg Oral Every 12 hours 03/08/23 1556 03/10/23 0814   03/08/23 1400  cefTRIAXone (ROCEPHIN) 1 g in sodium chloride 0.9 % 100 mL IVPB        1 g 200 mL/hr over 30 Minutes Intravenous  Once 03/08/23 1346 03/08/23 1507   03/08/23 1400  doxycycline (VIBRA-TABS) tablet 100 mg        100 mg Oral  Once 03/08/23 1346 03/08/23 1435       Procedures:   Consultants: ID    Assessment and Plan: * PNA (pneumonia) 03-08-2023 thru 03-11-2023 Has background history of chronic respiratory failure?(Not on oxygen), history of lung cancer, COPD, radiation pneumonitis, Chronic PE noticed but unchanged now with pneumonia> has Hemophilus bacteremia in BCID likely translocation from pneumonia-discussed with pharmacy, changed antibiotic to ceftriaxone, follow-up C/S. Echo obtained no acute finding currently afebrile leukocytosis resolved-msg sent to Dr Drue Second for antibiotic recommendation- she is repeating Blood culture and advised to continue ceftriaxone 2gm iv qd,  an switch to amox/clav when ready for discharge-total duration of treatment 2 weeks. Continue modified diet per SLP, continue antibiotics 03-12-2023 remains on IV rocephin. Switch to augmentin at discharge. Awaiting PT consult. Pt's son Evan [name] says that pt was able to walk twice a day in their home. Mostly uses WC for longer distances.  03-13-2023 will repeat CT chest without contrast to assess for empyema given H. Flu septicemia. If CT chest shows improvement, pt can possibly go home later today.  Haemophilus influenzae septicemia (HCC) 03-12-2023 ID consulted. Repeat blood cx drawn. Echo negative.  Chronic indwelling Foley catheter 03-12-2023 pt's son Evan [name] states pt has had chronic foley catheter for several months. Even 1 year or longer.  Anemia 03-12-2023 stable.  Iron/TIBC/Ferritin/ %Sat    Component Value Date/Time   IRON 71 09/27/2022 2018   TIBC 262  09/27/2022 2018   FERRITIN 88 09/27/2022 2018   IRONPCTSAT 27 09/27/2022 2018     Dementia without behavioral disturbance (HCC) 03-12-2023 chronic.  History of pulmonary embolism 03-12-2023 small chronic PE seen on initial CTPA on admission. On eliquis 2.5 mg bid.  Chronic respiratory failure, unsp w hypoxia or hypercapnia (HCC) 03-12-2023 stable. Unclear if he is on chronic O2 at home. Currently on RA here in hospital.  Hemiparesis due to old stroke (HCC) 03-12-2023 chronic.  Pressure ulcer of back 03-12-2023 sacral pressure stage 1. Identified present on admission. Seen nursing notes.  COPD (chronic obstructive pulmonary disease) (HCC) 03-12-2023 stable. Hx of radiation pneumonitis. Stable. Continue with nebs and inhalers.  CKD stage 3b, GFR 30-44 ml/min (HCC) - baseline SCr 1.2-1.5 03-08-2023 thru 03-12-2023 B/l creat ~ 1.3- 1.5 -creatinine nicely improved to 1.2 baseline with metabolic acidosis add sodium bicarb resume home Lasix and monitor  03-12-2023 stable.   HFmrEF (heart failure with mildly reduced ejection fraction) (HCC) 03-08-2023 thru 03-11-2023 BNP 847 on admission previously in 160s to 550s, echo from July 2020 was 40-50-55%.  On Lasix 40 mg at home and holding for now-CT scan on admission shows groundglass opacities and possible mild CHF/chronic  interstitial lung disease-repeat echo unremarkable with normal EF, resume Lasix today   03-12-2023 stable. Appears euvolemic. On RA.  03-13-2023 needed 1 dose of IV lasix. Lasix had been held due to AKI on admission. Will restart lasix 40 mg daily.  Permanent atrial fibrillation (HCC) 03-12-2023 stable. On eliquis 2.5 mg bid.  GAD (generalized anxiety disorder) 03-12-2023 continue with prozac 20 mg qday.  Benign prostatic hyperplasia without lower urinary tract symptoms 03-12-2023 stable. On flomax 0.4 mg qday, proscar 5 mg qday.  S/P CABG (coronary artery bypass graft) 03-12-2023 stable  Essential hypertension 03-08-2023  thru 03-11-2023 BP is controlled.He was hypotensive 12/4 so continue to hold lasix metoprolol   03-12-2023 BP stable.off HTN meds  Hyperlipidemia, unspecified 03-12-2023 stable on crestor 5 mg at bedtime.  Gastro-esophageal reflux disease without esophagitis 03-12-2023 stable. On protonix 40 mg qday   DVT prophylaxis: apixaban (ELIQUIS) tablet 2.5 mg Start: 03/08/23 2200 apixaban (ELIQUIS) tablet 2.5 mg     Code Status: Full Code Family Communication: discussed with pt and dtr Evan [name] at bedside Disposition Plan: return home Reason for continuing need for hospitalization: repeat CT chest today. Potential for DC later today.  Objective: Vitals:   03/13/23 0521 03/13/23 0709 03/13/23 0718 03/13/23 1133  BP: (!) 143/78   110/69  Pulse: 94 91  85  Resp: 18 17    Temp: 98 F (36.7 C)   (!) 97.5 F (36.4 C)  TempSrc:    Oral  SpO2: 95% 97% 97% 92%  Weight: 66.7 kg       Intake/Output Summary (Last 24 hours) at 03/13/2023 1306 Last data filed at 03/13/2023 0525 Gross per 24 hour  Intake 3 ml  Output 2800 ml  Net -2797 ml   Filed Weights   03/10/23 0500 03/12/23 0458 03/13/23 0521  Weight: 65.1 kg 88.5 kg 66.7 kg    Examination:  Physical Exam Vitals and nursing note reviewed.  HENT:     Head: Normocephalic and atraumatic.     Nose: Nose normal.  Cardiovascular:     Rate and Rhythm: Normal rate and regular rhythm.  Pulmonary:     Effort: Pulmonary effort is normal.     Breath sounds: Normal breath sounds.  Abdominal:     General: Bowel sounds are normal.     Palpations: Abdomen is soft.  Skin:    General: Skin is warm and dry.     Capillary Refill: Capillary refill takes less than 2 seconds.  Neurological:     Mental Status: He is alert.     Comments: Hard of hearing     Data Reviewed: I have personally reviewed following labs and imaging studies  CBC: Recent Labs  Lab 03/08/23 1033 03/08/23 1152 03/09/23 0659 03/10/23 0541 03/11/23 0541 03/12/23 0724  03/13/23 0720  WBC 20.6*  --  12.6* 9.0 8.1 8.9 7.4  NEUTROABS 17.8*  --   --   --   --   --  5.1  HGB 10.4*   < > 9.2* 8.7* 8.3* 9.2* 8.9*  HCT 32.2*   < > 27.2* 25.5* 24.6* 27.3* 26.7*  MCV 103.2*  --  100.0 99.6 99.2 100.7* 99.6  PLT 80*  --  67* 66* 65* 66* 76*   < > = values in this interval not displayed.   Basic Metabolic Panel: Recent Labs  Lab 03/08/23 1130 03/08/23 1152 03/09/23 0659 03/10/23 0540 03/10/23 0541 03/11/23 0541 03/12/23 0724 03/13/23 0720  NA 135   < > 135  --  130* 131* 134* 134*  K 3.4*   < > 3.4*  --  2.9* 4.1 4.1 3.7  CL 104   < > 105  --  104 107 109 107  CO2 18*  --  20*  --  17* 16* 14* 19*  GLUCOSE 119*   < > 111*  --  105* 118* 112* 98  BUN 24*   < > 30*  --  32* 20 12 11   CREATININE 1.53*   < > 1.50*  --  1.41* 1.25* 1.09 1.08  CALCIUM 9.1  --  8.6*  --  8.3* 8.3* 8.8* 8.3*  MG 1.7  --   --  1.7  --   --   --  1.9   < > = values in this interval not displayed.   GFR: Estimated Creatinine Clearance: 34.4 mL/min (by C-G formula based on SCr of 1.08 mg/dL). Liver Function Tests: Recent Labs  Lab 03/08/23 1130 03/09/23 0659 03/13/23 0720  AST 18 19 16   ALT 10 11 13   ALKPHOS 72 64 76  BILITOT 2.6* 1.5* 0.9  PROT 6.9 5.8* 6.0*  ALBUMIN 3.2* 2.6* 2.4*   BNP (last 3 results) Recent Labs    09/27/22 2006 03/08/23 1130 03/13/23 0720  BNP 559.6* 847.7* 612.7*   CBG: Recent Labs  Lab 03/08/23 1030  GLUCAP 127*   Sepsis Labs: Recent Labs  Lab 03/08/23 1313 03/08/23 1607 03/13/23 0720  PROCALCITON  --   --  0.27  LATICACIDVEN 2.1* 1.5  --     Recent Results (from the past 240 hour(s))  Resp panel by RT-PCR (RSV, Flu A&B, Covid) Anterior Nasal Swab     Status: None   Collection Time: 03/08/23 10:33 AM   Specimen: Anterior Nasal Swab  Result Value Ref Range Status   SARS Coronavirus 2 by RT PCR NEGATIVE NEGATIVE Final   Influenza A by PCR NEGATIVE NEGATIVE Final   Influenza B by PCR NEGATIVE NEGATIVE Final    Comment:  (NOTE) The Xpert Xpress SARS-CoV-2/FLU/RSV plus assay is intended as an aid in the diagnosis of influenza from Nasopharyngeal swab specimens and should not be used as a sole basis for treatment. Nasal washings and aspirates are unacceptable for Xpert Xpress SARS-CoV-2/FLU/RSV testing.  Fact Sheet for Patients: BloggerCourse.com  Fact Sheet for Healthcare Providers: SeriousBroker.it  This test is not yet approved or cleared by the [place] FDA and has been authorized for detection and/or diagnosis of SARS-CoV-2 by FDA under an Emergency Use Authorization (EUA). This EUA will remain in effect (meaning this test can be used) for the duration of the COVID-19 declaration under Section 564(b)(1) of the Act, 21 U.S.C. section 360bbb-3(b)(1), unless the authorization is terminated or revoked.     Resp Syncytial Virus by PCR NEGATIVE NEGATIVE Final    Comment: (NOTE) Fact Sheet for Patients: BloggerCourse.com  Fact Sheet for Healthcare Providers: SeriousBroker.it  This test is not yet approved or cleared by the [place] FDA and has been authorized for detection and/or diagnosis of SARS-CoV-2 by FDA under an Emergency Use Authorization (EUA). This EUA will remain in effect (meaning this test can be used) for the duration of the COVID-19 declaration under Section 564(b)(1) of the Act, 21 U.S.C. section 360bbb-3(b)(1), unless the authorization is terminated or revoked.  Performed at [facility], [address] [place], [place] [address]   Culture, blood (routine x 2)     Status: Abnormal (Preliminary result)   Collection Time: 03/08/23 10:45 AM  Specimen: BLOOD RIGHT FOREARM  Result Value Ref Range Status   Specimen Description BLOOD RIGHT FOREARM  Final   Special Requests   Final    BOTTLES DRAWN AEROBIC AND ANAEROBIC Blood Culture results may not be optimal due to  an inadequate volume of blood received in culture bottles   Culture  Setup Time   Final    GRAM NEGATIVE RODS IN BOTH AEROBIC AND ANAEROBIC BOTTLES CRITICAL RESULT CALLED TO, READ BACK BY AND VERIFIED WITH: PHARMD MADISON OWEN ON 03/09/23 @ 1751 BY DRT    Culture (A)  Final    HAEMOPHILUS INFLUENZAE BETA LACTAMASE NEGATIVE HEALTH DEPARTMENT NOTIFIED Performed at [facility], [address] [place], [place] [address]    Report Status PENDING  Incomplete  Blood Culture ID Panel (Reflexed)     Status: Abnormal   Collection Time: 03/08/23 10:45 AM  Result Value Ref Range Status   Enterococcus faecalis NOT DETECTED NOT DETECTED Final   Enterococcus Faecium NOT DETECTED NOT DETECTED Final   Listeria monocytogenes NOT DETECTED NOT DETECTED Final   Staphylococcus species NOT DETECTED NOT DETECTED Final   Staphylococcus aureus (BCID) NOT DETECTED NOT DETECTED Final   Staphylococcus epidermidis NOT DETECTED NOT DETECTED Final   Staphylococcus lugdunensis NOT DETECTED NOT DETECTED Final   Streptococcus species NOT DETECTED NOT DETECTED Final   Streptococcus agalactiae NOT DETECTED NOT DETECTED Final   Streptococcus pneumoniae NOT DETECTED NOT DETECTED Final   Streptococcus pyogenes NOT DETECTED NOT DETECTED Final   A.calcoaceticus-baumannii NOT DETECTED NOT DETECTED Final   Bacteroides fragilis NOT DETECTED NOT DETECTED Final   Enterobacterales NOT DETECTED NOT DETECTED Final   Enterobacter cloacae complex NOT DETECTED NOT DETECTED Final   Escherichia coli NOT DETECTED NOT DETECTED Final   Klebsiella aerogenes NOT DETECTED NOT DETECTED Final   Klebsiella oxytoca NOT DETECTED NOT DETECTED Final   Klebsiella pneumoniae NOT DETECTED NOT DETECTED Final   Proteus species NOT DETECTED NOT DETECTED Final   Salmonella species NOT DETECTED NOT DETECTED Final   Serratia marcescens NOT DETECTED NOT DETECTED Final   Haemophilus influenzae DETECTED (A) NOT DETECTED Final    Comment: CRITICAL  RESULT CALLED TO, READ BACK BY AND VERIFIED WITH: PHARMD MADISON OWEN ON 03/09/23 @ 1751 BY DRT    Neisseria meningitidis NOT DETECTED NOT DETECTED Final   Pseudomonas aeruginosa NOT DETECTED NOT DETECTED Final   Stenotrophomonas maltophilia NOT DETECTED NOT DETECTED Final   Candida albicans NOT DETECTED NOT DETECTED Final   Candida auris NOT DETECTED NOT DETECTED Final   Candida glabrata NOT DETECTED NOT DETECTED Final   Candida krusei NOT DETECTED NOT DETECTED Final   Candida parapsilosis NOT DETECTED NOT DETECTED Final   Candida tropicalis NOT DETECTED NOT DETECTED Final   Cryptococcus neoformans/gattii NOT DETECTED NOT DETECTED Final    Comment: Performed at [facility], [address] [place], [place] [address]  Culture, blood (routine x 2)     Status: Abnormal   Collection Time: 03/08/23 10:50 AM   Specimen: BLOOD  Result Value Ref Range Status   Specimen Description BLOOD RIGHT ANTECUBITAL  Final   Special Requests   Final    BOTTLES DRAWN AEROBIC AND ANAEROBIC Blood Culture results may not be optimal due to an inadequate volume of blood received in culture bottles   Culture  Setup Time   Final    GRAM NEGATIVE RODS IN BOTH AEROBIC AND ANAEROBIC BOTTLES CRITICAL VALUE NOTED.  VALUE IS CONSISTENT WITH PREVIOUSLY REPORTED AND CALLED  VALUE.    Culture HAEMOPHILUS INFLUENZAE BETA LACTAMASE NEGATIVE  (A)  Final   Report Status 03/11/2023 FINAL  Final  MRSA Next Gen by PCR, Nasal     Status: Abnormal   Collection Time: 03/10/23  3:18 PM   Specimen: Nasal Mucosa; Nasal Swab  Result Value Ref Range Status   MRSA by PCR Next Gen DETECTED (A) NOT DETECTED Final    Comment: RESULT CALLED TO, READ BACK BY AND VERIFIED WITH:  RN Chinita Greenland [phone] @ 2000 FH (NOTE) The GeneXpert MRSA Assay (FDA approved for NASAL specimens only), is one component of a comprehensive MRSA colonization surveillance program. It is not intended to diagnose MRSA infection nor to guide or monitor  treatment for MRSA infections. Test performance is not FDA approved in patients less than 58 years old. Performed at [name], [address] [place], [place] [address]   Culture, blood (Routine X 2) w Reflex to ID Panel     Status: None (Preliminary result)   Collection Time: 03/11/23 11:44 AM   Specimen: BLOOD RIGHT HAND  Result Value Ref Range Status   Specimen Description BLOOD RIGHT HAND  Final   Special Requests   Final    BOTTLES DRAWN AEROBIC AND ANAEROBIC Blood Culture results may not be optimal due to an inadequate volume of blood received in culture bottles   Culture   Final    NO GROWTH 2 DAYS Performed at [facility], [address] [place], [place] [address]    Report Status PENDING  Incomplete  Culture, blood (Routine X 2) w Reflex to ID Panel     Status: None (Preliminary result)   Collection Time: 03/11/23 11:45 AM   Specimen: BLOOD LEFT HAND  Result Value Ref Range Status   Specimen Description BLOOD LEFT HAND  Final   Special Requests   Final    BOTTLES DRAWN AEROBIC AND ANAEROBIC Blood Culture results may not be optimal due to an inadequate volume of blood received in culture bottles   Culture   Final    NO GROWTH 2 DAYS Performed at [facility], [address], [place], [place] [address]    Report Status PENDING  Incomplete     Radiology Studies: No results found.  Scheduled Meds:  acetylcysteine  4 mL Nebulization BID   apixaban  2.5 mg Oral BID   Chlorhexidine Gluconate Cloth  6 each Topical Daily   ciprofloxacin  2 drop Both Eyes Q4H while awake   finasteride  5 mg Oral Daily   FLUoxetine  20 mg Oral Daily   furosemide  40 mg Oral Daily   guaiFENesin  600 mg Oral BID   mometasone-formoterol  2 puff Inhalation BID   pantoprazole  40 mg Oral q morning   rosuvastatin  5 mg Oral QHS   sodium bicarbonate  1,300 mg Oral BID   sodium chloride flush  3 mL Intravenous Q12H   tamsulosin  0.4 mg Oral QHS   Continuous  Infusions:  cefTRIAXone (ROCEPHIN)  IV 2 g (03/13/23 1137)     LOS: 4 days   Time spent: 45 minutes  Carollee Herter, DO  [facility]  03/13/2023, 1:06 PM

## 2023-03-13 NOTE — Assessment & Plan Note (Signed)
03-13-2023 continue with po bicarb at home 650 mg bid. F/u with PCP for repeat BMP.

## 2023-03-13 NOTE — Progress Notes (Signed)
Occupational Therapy Treatment Patient Details Name: Evan [name] MRN: [id number] DOB: 1927/06/02 Today's Date: 03/13/2023   History of present illness 89 y.o. male admitted 12/4 with Pneumonia. PMH: CAD sp CABG, systolic  CHF EF 32-44% BPH, esophageal ulcer, GERD, HLD, HTN RA, hx of subdural hematoma in the past, HOH, thrombocytopenia   OT comments  Focus of session on self feeding. Daughter reports foam build ups, provided during last OT visit, are helpful for pt, but eating at bed level continues to be difficult. He is finger feeding and using a cup with a lid with set up, but daughter is feeding him. Pt washed his face at bed level with min assist for thoroughness.      If plan is discharge home, recommend the following:  A lot of help with walking and/or transfers;A lot of help with bathing/dressing/bathroom;Assistance with cooking/housework;Assistance with feeding;Help with stairs or ramp for entrance;Assist for transportation;Direct supervision/assist for financial management;Direct supervision/assist for medications management   Equipment Recommendations  None recommended by OT    Recommendations for Other Services      Precautions / Restrictions Precautions Precautions: Fall Required Braces or Orthoses: Other Brace Other Brace: Rt AFO Restrictions Weight Bearing Restrictions: No       Mobility Bed Mobility Overal bed mobility: Needs Assistance             General bed mobility comments: +2 total assist to pull up in bed prior to eating    Transfers                         Balance                                           ADL either performed or assessed with clinical judgement   ADL Overall ADL's : Needs assistance/impaired Eating/Feeding: Maximal assistance;Bed level Eating/Feeding Details (indicate cue type and reason): per daughter, foam build ups are helpful on eating utensils, but she continues to feed him at bed level, can  finger feed and bring cup with straw to mouth Grooming: Wash/dry face;Bed level;Minimal assistance Grooming Details (indicate cue type and reason): assist for thoroughness                                    Extremity/Trunk Assessment              Vision       Perception     Praxis      Cognition Arousal: Alert Behavior During Therapy: WFL for tasks assessed/performed Overall Cognitive Status: Difficult to assess                                 General Comments: very [facility]        Exercises      Shoulder Instructions       General Comments      Pertinent Vitals/ Pain       Pain Assessment Pain Assessment: No/denies pain  Home Living                                          Prior  Functioning/Environment              Frequency  Min 1X/week        Progress Toward Goals  OT Goals(current goals can now be found in the care plan section)  Progress towards OT goals: Progressing toward goals  Acute Rehab OT Goals OT Goal Formulation: With patient/family Time For Goal Achievement: 03/24/23 Potential to Achieve Goals: Fair  Plan      Co-evaluation                 AM-PAC OT "6 Clicks" Daily Activity     Outcome Measure   Help from another person eating meals?: A Lot Help from another person taking care of personal grooming?: A Little Help from another person toileting, which includes using toliet, bedpan, or urinal?: Total Help from another person bathing (including washing, rinsing, drying)?: A Lot Help from another person to put on and taking off regular upper body clothing?: Total Help from another person to put on and taking off regular lower body clothing?: Total 6 Click Score: 10    End of Session    OT Visit Diagnosis: Unsteadiness on feet (R26.81);Other abnormalities of gait and mobility (R26.89);Repeated falls (R29.6);Muscle weakness (generalized) (M62.81)   Activity Tolerance      Patient Left in bed;with call bell/phone within reach;with family/visitor present   Nurse Communication          Time: 1610-9604 OT Time Calculation (min): 18 min  Charges: OT General Charges $OT Visit: 1 Visit OT Treatments $Self Care/Home Management : 8-22 mins  Berna Spare, OTR/L Acute Rehabilitation Services Office: [phone]   [name] 03/13/2023, 12:50 PM

## 2023-03-13 NOTE — Plan of Care (Signed)

## 2023-03-14 ENCOUNTER — Telehealth: Payer: Self-pay | Admitting: *Deleted

## 2023-03-14 ENCOUNTER — Other Ambulatory Visit: Payer: Self-pay | Admitting: Family Medicine

## 2023-03-14 ENCOUNTER — Telehealth: Payer: Self-pay

## 2023-03-14 ENCOUNTER — Encounter: Payer: Self-pay | Admitting: *Deleted

## 2023-03-14 ENCOUNTER — Other Ambulatory Visit (HOSPITAL_COMMUNITY): Payer: Self-pay

## 2023-03-14 NOTE — Telephone Encounter (Signed)
Copied from CRM [id number]. Topic: Appointments - Scheduling Inquiry for Clinic >> Mar 14, 2023 11:31 AM Evan [name] wrote: Reason for CRM: Evan [name] pt's dtr [phone] states pt was advised to have needs to have blood work in 1 week from being released from the hospital [facility] in [place] and a f/u. Pt also needs ear cleaned out. The next availability is 03/31/23, Evan [name] states they cannot wait and wants pt to be seen. Pls advise and [name]/b.

## 2023-03-14 NOTE — Transitions of Care (Post Inpatient/ED Visit) (Signed)
03/14/2023  Name: Evan [name] MRN: [id number] DOB: [birth year]  Today's TOC FU Call Status: Today's TOC FU Call Status:: Successful TOC FU Call Completed TOC FU Call Complete Date: 03/14/23 Patient's Name and Date of Birth confirmed.  Transition Care Management Follow-up Telephone Call Date of Discharge: 03/13/23 Discharge Facility: [facility]) Type of Discharge: Inpatient Admission Primary Inpatient Discharge Diagnosis:: Community Acquired Pneumonia How have you been since you were released from the hospital?:  (daughter reports " he's doing so much better"  eating protein mostly oysters and shrimp, taking all medication as prescribed, no issues with breathing, no dyspnea reported) Any questions or concerns?: No  Items Reviewed: Did you receive and understand the discharge instructions provided?: Yes Medications obtained,verified, and reconciled?: Yes (Medications Reviewed) Any new allergies since your discharge?: No Dietary orders reviewed?: Yes Type of Diet Ordered:: low sodium, heart healthy Do you have support at home?: Yes People in Home: alone Name of Support/Comfort Primary Source: [name] and other adult children come in and stay with pt Patient enrolled in 30 day program   Goals Addressed             This Visit's Progress    Transition of Care/ pt will have no readmissions within 30 days       Current Barriers:  Chronic Disease Management support and education needs related to Community Acquired Pneumonia  Cognitive Deficits- per daughter, pt has short term memory loss Spoke with daughter [name] who reports she and siblings care for patient, pt is able to ambulate with walker and they are trying to encourage pt to change positions q 2 hours and get up and walk, pt had open area on sacrum with no wound care ordered, no mention of drainage, daughter states she will observe the area when she assists pt with bath, air overlay mattress was ordered  and per daughter [employer] said patient's sacral area does not meet requirements to get this type of overlay and family is not paying out of pocket. Daughter reports she will call today and make primary care provider post hospital follow up appointment and verbalizes understanding pt is to have labwork (CXR in the future), daughter has not heard anything from [facility].  RNCM Clinical Goal(s):  Patient will work with the Care Management team over the next 30 days to address Transition of Care Barriers: Home Health services verbalize basic understanding of  Community Acquired Pneumonia disease process and self health management plan as evidenced by patient/ CG report, review of EMR and  through collaboration with RN Care manager, provider, and care team.   Interventions: Evaluation of current treatment plan related to  self management and patient's adherence to plan as established by provider Reviewed discharge instructions, AVS and medications   Community Acquired Pneumonia  (Status:  New goal. and Goal on track:  Yes.)  Short Term Goal Evaluation of current treatment plan related to  Community Acquired Pneumonia , ADL IADL limitations and Cognitive Deficits self-management and patient's adherence to plan as established by provider. Discussed plans with patient for ongoing care management follow up and provided patient with direct contact information for care management team Evaluation of current treatment plan related to Community Acquired Pneumonia and patient's adherence to plan as established by provider Advised patient to report any signs/ symptoms infection to provider Provided education to patient re: signs/ symptoms of infection Reviewed medications with patient and discussed importance of taking as prescribed (reviewed with CG)  Reviewed scheduled/upcoming provider appointments including pt does not have post hospital follow up appointment with primary care provider, daughter will  call and scheduled today Assessed social determinant of health barriers Telephone call to [facility], spoke with [facility] who reports she cannot see patient's orders and will check further into this and call RN Care Manager back   Patient Goals/Self-Care Activities: Participate in Transition of Care Program/Attend [facility] scheduled calls Notify RN Care Manager of TOC call rescheduling needs Take all medications as prescribed Attend all scheduled provider appointments Call pharmacy for medication refills 3-7 days in advance of running out of medications Call provider office for new concerns or questions  Call your doctor for any signs/ symptoms of infection (fever above 100.4, overall not feeling well, increase sputum, change in color of sputum) Call today and make primary care provider appointment for post hospital follow up Home health is being checked into ~  [facility]  [phone]  Follow Up Plan:  Telephone follow up appointment with care management team member scheduled for:  03/21/23 at 1 pm The patient has been provided with contact information for the care management team and has been advised to call with any health related questions or concerns.           Medications Reviewed Today: Medications Reviewed Today     Reviewed by Audrie Gallus, RN (Registered Nurse) on 03/14/23 at 1047  Med List Status: <None>   Medication Order Taking? Sig Documenting Provider Last Dose Status Informant  acetaminophen (TYLENOL) 500 MG tablet [id number] Yes Take 500 mg by mouth 2 (two) times daily as needed for mild pain, moderate pain, fever or headache. [provider] Taking Active Child, Pharmacy Records  amoxicillin-clavulanate (AUGMENTIN) 875-125 MG tablet [id number] Yes Take 1 tablet by mouth 2 (two) times daily for 10 days. Carollee Herter, DO Taking Active   apixaban (ELIQUIS) 2.5 MG TABS tablet [id number] Yes Take 1 tablet (2.5 mg total) by mouth 2 (two) times daily. Gaston Islam., NP Taking Active Child, Pharmacy Records  budesonide-formoterol [facility]) 160-4.5 MCG/ACT inhaler [id number] Yes Inhale 2 puffs into the lungs in the morning and at bedtime. [name], York Ram, MD Taking Active Child, Pharmacy Records  ciprofloxacin (CILOXAN) 0.3 % ophthalmic solution [id number] Yes Place 2 drops into both eyes every 4 (four) hours while awake. Administer 1 drop, every 2 hours, while awake, for 2 days. Then 1 drop, every 4 hours, while awake, for the next 5 days. Carollee Herter, DO Taking Active   ferrous sulfate (FEROSUL) 325 (65 FE) MG tablet [id number] Yes TAKE 1 TABLET EVERY OTHER DAY Donita Brooks, MD Taking Active Child, Pharmacy Records  finasteride (PROSCAR) 5 MG tablet [id number] Yes Take 1 tablet (5 mg total) by mouth daily. Donita Brooks, MD Taking Active Child, Pharmacy Records  FLUoxetine [facility]) 20 MG capsule [id number] Yes TAKE 1 CAPSULE EVERY DAY Donita Brooks, MD Taking Active Child, Pharmacy Records  folic acid (FOLVITE) 1 MG tablet [id number] Yes Take 1 tablet (1 mg total) by mouth daily. Donita Brooks, MD Taking Active Child, Pharmacy Records  furosemide (LASIX) 40 MG tablet [id number] Yes Take 1 tablet (40 mg total) by mouth daily.  Patient taking differently: Take 40 mg by mouth at bedtime.   Donita Brooks, MD Taking Active Child, Pharmacy Records  metoprolol tartrate (LOPRESSOR) 25 MG tablet [id number] Yes Take 12.5 mg by mouth See admin instructions. Take 12.5 mg (1/2 tablet) daily. May take an additional  1/2 tablet daily if needed for chest pain or elevated blood pressure. [provider] Taking Active Child, Pharmacy Records  Multiple Vitamins-Minerals (MENS 50+ MULTIVITAMIN) TABS [id number] Yes Take 1 tablet by mouth daily. [provider] Taking Active Child, Pharmacy Records  pantoprazole (PROTONIX) 40 MG tablet [id number] Yes TAKE 1 TABLET EVERY MORNING Pickard, Priscille Heidelberg, MD Taking Active Child, Pharmacy Records   rosuvastatin (CRESTOR) 5 MG tablet [id number] Yes TAKE 1 TABLET EVERY DAY  Patient taking differently: Take 5 mg by mouth at bedtime.   Corky Crafts, MD Taking Active Child, Pharmacy Records  senna-docusate (STIMULANT LAXATIVE) 8.6-50 MG tablet [id number] Yes Take 2 tablets by mouth 2 (two) times daily as needed for constipation  Taking Active Child, Pharmacy Records  sodium bicarbonate 650 MG tablet [id number] Yes Take 1 tablet (650 mg total) by mouth 2 (two) times daily. Carollee Herter, DO Taking Active   tamsulosin [facility]) 0.4 MG CAPS capsule [id number] Yes Take 1 capsule (0.4 mg total) by mouth daily with supper.  Patient taking differently: Take 0.4 mg by mouth at bedtime.   Donita Brooks, MD Taking Active Child, Pharmacy Records  Zinc Oxide (TRIPLE PASTE) 12.8 % ointment [id number] Yes Apply topically 4 (four) times daily as needed (skin barrier).  Patient taking differently: Apply 1 Application topically 2 (two) times daily as needed (skin barrier).   Zigmund Daniel., MD Taking Active Child, Pharmacy Records            Home Care and Equipment/Supplies: Were Home Health Services Ordered?: Yes Has Agency set up a time to come to your home?: No EMR reviewed for Home Health Orders: Orders present/patient has not received call (refer to CM for follow-up)  Functional Questionnaire: Do you need assistance with bathing/showering or dressing?: Yes (has shower seat and family assists) Do you need assistance with meal preparation?: Yes Do you need assistance with eating?: No Do you have difficulty maintaining continence: Yes (foley catheter) Do you need assistance with getting out of bed/getting out of a chair/moving?: Yes Do you have difficulty managing or taking your medications?: Yes  Follow up appointments reviewed: Do you need transportation to your follow-up appointment?: No Do you understand care options if your condition(s) worsen?: Yes-patient verbalized  understanding  SDOH Interventions Today    Flowsheet Row Most Recent Value  SDOH Interventions   Food Insecurity Interventions Intervention Not Indicated  Housing Interventions Intervention Not Indicated  Transportation Interventions Intervention Not Indicated  Utilities Interventions Intervention Not Indicated       Irving Shows [facility], BSN RN Care Manager/ Transition of Care [place]/ [facility] [phone]

## 2023-03-15 ENCOUNTER — Other Ambulatory Visit (HOSPITAL_COMMUNITY): Payer: Self-pay

## 2023-03-15 ENCOUNTER — Encounter (HOSPITAL_COMMUNITY): Payer: Self-pay

## 2023-03-15 ENCOUNTER — Other Ambulatory Visit: Payer: Self-pay

## 2023-03-16 ENCOUNTER — Telehealth: Payer: Self-pay

## 2023-03-16 LAB — CULTURE, BLOOD (ROUTINE X 2)
Culture: NO GROWTH
Culture: NO GROWTH

## 2023-03-16 NOTE — Telephone Encounter (Signed)
Copied from CRM [id number]. Topic: Referral - Status >> Mar 16, 2023  1:42 PM Fuller Mandril wrote: Reason for CRM: Eileen Stanford called from [facility] to make provider aware that there has been a delay in starting home health services due to Pt daughter's request. Services will now start 12/14.

## 2023-03-17 ENCOUNTER — Ambulatory Visit (INDEPENDENT_AMBULATORY_CARE_PROVIDER_SITE_OTHER): Payer: Medicare HMO | Admitting: Family Medicine

## 2023-03-17 ENCOUNTER — Encounter: Payer: Self-pay | Admitting: Family Medicine

## 2023-03-17 VITALS — BP 126/76 | HR 76 | Temp 98.4°F | Ht 63.0 in | Wt 145.0 lb

## 2023-03-17 DIAGNOSIS — A413 Sepsis due to Hemophilus influenzae: Secondary | ICD-10-CM | POA: Diagnosis not present

## 2023-03-17 NOTE — Progress Notes (Signed)
Subjective:   Patient was recently admitted to the hospital with sepsis.  Blood cultures revealed haemophilus influenza.  Lactic acid level was 2.1.  Patient was in metabolic acidosis.  Chest CT showed possible right lower lobe and right middle lobe pneumonia.  Patient was treated with Rocephin IV x 6 days and then was transition to 10 days of Augmentin.  He is here today with his family.  He denies any chest pain.  He denies any shortness of breath.  He denies any hemoptysis or pleurisy.  He still has a few days of antibiotics left.  He is also on bicarbonate for metabolic acidosis.  There is no evidence of peripheral edema or fluid retention on the sodium bicarbonate. 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   Altered mental status 08/21/2018   Anginal chest pain at rest [facility])    Chronic non, controlled on Lorazepam   Anxiety    Arthritis    "all over"    BPH (benign prostatic hyperplasia)    CAD (coronary artery disease)    Cellulitis of arm, right 06/01/2018   Cellulitis of left axilla    Chronic lower back pain    Complication of anesthesia    "problems making water afterwards"   Depression    DJD (degenerative joint disease)    Esophageal ulcer without bleeding    bid ppi indefinitely   Fall 07/16/2018   Family history of adverse reaction to anesthesia    "children w/PONV"   GERD (gastroesophageal reflux disease)    Headache    "couple/week maybe" (09/04/2015)   Hemochromatosis    Possible, elevated iron stores, hook-like osteophytes on hand films, normal LFTs   Hepatitis    "yellow jaundice as a baby"   History of ASCVD    MULTIVESSEL   Hyperlipidemia    Hypertension    Hypotension    Mild aortic stenosis 06/23/2021   Osteoarthritis    Pneumonia     "several times; got a little now" (09/04/2015)   Pulmonary embolism (HCC) 02/02/2018   Rash and nonspecific skin eruption 06/01/2018   Rheumatoid arthritis (HCC)    RSV (respiratory syncytial virus pneumonia) 05/15/2022   Subdural hematoma (HCC)    small after fall 09/2016-plavix held, neurosurgery consulted.  Asymptomatic.     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 he failed PO Eliquis   Past Surgical History:  Procedure Laterality Date   ARM SKIN LESION BIOPSY / EXCISION Left 09/02/2015   CATARACT EXTRACTION W/ INTRAOCULAR LENS  IMPLANT, BILATERAL Bilateral    CHOLECYSTECTOMY OPEN     CORNEAL TRANSPLANT Bilateral    "one at [facility]; one at [facility]"   CORONARY ANGIOPLASTY WITH STENT PLACEMENT     CORONARY ARTERY BYPASS GRAFT  1999   DESCENDING AORTIC ANEURYSM REPAIR W/ STENT     DE stent ostium into the right radial free graft at OM-, 02-2007   ESOPHAGOGASTRODUODENOSCOPY N/A 11/09/2014   Procedure: ESOPHAGOGASTRODUODENOSCOPY (EGD);  Surgeon: Dorena Cookey, MD;  Location: [facility] ENDOSCOPY;  Service: Endoscopy;  Laterality: N/A;   INGUINAL HERNIA REPAIR     JOINT REPLACEMENT     LEFT HEART CATH AND CORS/GRAFTS ANGIOGRAPHY N/A 06/27/2017   Procedure: LEFT HEART CATH AND CORS/GRAFTS ANGIOGRAPHY;  Surgeon: Lennette Bihari, MD;  Location: [facility] INVASIVE CV LAB;  Service: Cardiovascular;  Laterality: N/A;   NASAL SINUS SURGERY     SHOULDER OPEN ROTATOR CUFF REPAIR Right    TOTAL KNEE ARTHROPLASTY Bilateral    Current Outpatient Medications on File Prior to Visit  Medication Sig Dispense Refill   acetaminophen (TYLENOL) 500 MG tablet Take 500 mg by mouth 2 (two) times daily as needed for mild pain, moderate pain, fever or headache.     amoxicillin-clavulanate (AUGMENTIN) 875-125 MG tablet Take 1 tablet by mouth 2 (two) times daily for 10 days. 20 tablet 0   apixaban (ELIQUIS) 2.5 MG TABS tablet Take 1 tablet (2.5 mg total) by mouth 2 (two) times daily. 180 tablet 3   budesonide-formoterol  (SYMBICORT) 160-4.5 MCG/ACT inhaler Inhale 2 puffs into the lungs in the morning and at bedtime. 10.2 g 12   ciprofloxacin (CILOXAN) 0.3 % ophthalmic solution Place 2 drops into both eyes every 4 (four) hours while awake. Administer 1 drop, every 2 hours, while awake, for 2 days. Then 1 drop, every 4 hours, while awake, for the next 5 days. 5 mL 0   ferrous sulfate (FEROSUL) 325 (65 FE) MG tablet TAKE 1 TABLET EVERY OTHER DAY 90 tablet 3   finasteride (PROSCAR) 5 MG tablet Take 1 tablet (5 mg total) by mouth daily. 90 tablet 3   FLUoxetine (PROZAC) 20 MG capsule TAKE 1 CAPSULE EVERY DAY 90 capsule 3   folic acid (FOLVITE) 1 MG tablet Take 1 tablet (1 mg total) by mouth daily. 30 tablet 2   furosemide (LASIX) 40 MG tablet Take 1 tablet (40 mg total) by mouth daily. (Patient taking differently: Take 40 mg by mouth at bedtime.) 90 tablet 1   metoprolol tartrate (LOPRESSOR) 25 MG tablet Take 12.5 mg by mouth See admin instructions. Take 12.5 mg (1/2 tablet) daily. May take an additional 1/2 tablet daily if needed for chest pain or elevated blood pressure.     Multiple Vitamins-Minerals (MENS 50+ MULTIVITAMIN) TABS Take 1 tablet by mouth daily.     pantoprazole (PROTONIX) 40 MG tablet TAKE 1 TABLET EVERY MORNING 90 tablet 0   rosuvastatin (CRESTOR) 5 MG tablet TAKE 1 TABLET EVERY DAY (Patient taking differently: Take 5 mg by mouth at bedtime.) 30 tablet 11   senna-docusate (STIMULANT LAXATIVE) 8.6-50 MG tablet Take 2 tablets by mouth 2 (two) times daily as needed for constipation 60 tablet 2   sodium bicarbonate 650 MG tablet Take 1 tablet (650 mg total) by mouth 2 (two) times daily. 60 tablet 0   tamsulosin (FLOMAX) 0.4 MG CAPS capsule Take 1 capsule (0.4 mg total) by mouth daily with supper. (Patient taking differently: Take 0.4 mg by mouth at bedtime.) 90 capsule 3   Zinc Oxide (TRIPLE PASTE) 12.8 % ointment Apply topically 4 (four) times daily as needed (skin barrier). (Patient taking differently: Apply  1 Application topically 2 (two) times daily as needed (skin barrier).) 56.7 g 0   No current facility-administered medications on file prior to visit.   Allergies  Allergen Reactions   Feldene [Piroxicam] Rash    Blistering rash     Neomycin-Polymyxin-Hc Itching   Statins Other (See Comments)    Myalgias   Latex Rash    Band Aid   Tequin [Gatifloxacin] Swelling and Anxiety   Valium [Diazepam] Other (See Comments)    Dizziness    Vibramycin [Doxycycline] Itching and Nausea Only   Social History   Socioeconomic History   Marital status: Widowed    Spouse name: Not on file  Number of children: 2   Years of education: 12   Highest education level: 12th grade  Occupational History   Not on file  Tobacco Use   Smoking status: Former    Types: Cigarettes    Passive exposure: Yes   Smokeless tobacco: Former    Types: Chew   Tobacco comments:    "quit chewing in the 1960s"    Verified by Daughter, [name]  Vaping Use   Vaping status: Never Used  Substance and Sexual Activity   Alcohol use: No   Drug use: No   Sexual activity: Never  Other Topics Concern   Not on file  Social History Narrative   Not on file   Social Drivers of Health   Financial Resource Strain: Low Risk  (07/25/2022)   Overall Financial Resource Strain (CARDIA)    Difficulty of Paying Living Expenses: Not hard at all  Food Insecurity: No Food Insecurity (03/14/2023)   Hunger Vital Sign    Worried About Running Out of Food in the Last Year: Never true    Ran Out of Food in the Last Year: Never true  Transportation Needs: No Transportation Needs (03/14/2023)   PRAPARE - Administrator, Civil Service (Medical): No    Lack of Transportation (Non-Medical): No  Physical Activity: Inactive (06/30/2022)   Exercise Vital Sign    Days of Exercise per Week: 0 days    Minutes of Exercise per Session: 0 min  Stress: No Stress Concern Present (07/25/2022)   [employer] of  Occupational Health - Occupational Stress Questionnaire    Feeling of Stress : Only a little  Social Connections: Moderately Isolated (06/30/2022)   Social Connection and Isolation Panel [NHANES]    Frequency of Communication with Friends and Family: More than three times a week    Frequency of Social Gatherings with Friends and Family: More than three times a week    Attends Religious Services: 1 to 4 times per year    Active Member of [employer] or Organizations: No    Attends Banker Meetings: Never    Marital Status: Widowed  Intimate Partner Violence: Not At Risk (03/14/2023)   Humiliation, Afraid, Rape, and Kick questionnaire    Fear of Current or Ex-Partner: No    Emotionally Abused: No    Physically Abused: No    Sexually Abused: No      Review of Systems     Objective:   Physical Exam Vitals reviewed.  Constitutional:      General: He is not in acute distress.    Appearance: Normal appearance. He is not toxic-appearing or diaphoretic.  Cardiovascular:     Rate and Rhythm: Normal rate and regular rhythm.     Heart sounds: Normal heart sounds. No murmur heard.    No friction rub. No gallop.  Pulmonary:     Effort: Pulmonary effort is normal. No respiratory distress.     Breath sounds: Rales present. No wheezing or rhonchi.    Musculoskeletal:     Right lower leg: No edema.     Left lower leg: No edema.  Neurological:     Mental Status: He is alert.   Right-sided hemiparesis with weakness in his right leg.  Weakness with knee flexion and extension and ankle flexion and extension.  Ataxia in the right upper arm and right lower leg.  Patient has chronic right basilar crackles that have been present ever since his treatment for cancer.  This  is stable and at his baseline.       Assessment & Plan:  Sepsis due to Haemophilus influenzae, unspecified whether acute organ dysfunction present (HCC) - Plan: CBC with Differential/Platelet, COMPLETE METABOLIC PANEL  WITH GFR, Culture, blood (single) w Reflex to ID Panel Clinically, the patient has recovered ammonia.  Repeat a chest x-ray at the end of January to ensure resolution.  Check CBC to ensure no evidence of leukocytosis.  White blood cell count upon admission was 20.6.  Check CMP to monitor for metabolic acidosis.  If acidosis has resolved, we will discontinue sodium bicarbonate.  If labs are normal, complete antibiotics and no further treatment.  Repeat blood cultures.

## 2023-03-18 DIAGNOSIS — S2090XD Unspecified superficial injury of unspecified parts of thorax, subsequent encounter: Secondary | ICD-10-CM | POA: Diagnosis not present

## 2023-03-18 DIAGNOSIS — J440 Chronic obstructive pulmonary disease with acute lower respiratory infection: Secondary | ICD-10-CM | POA: Diagnosis not present

## 2023-03-18 DIAGNOSIS — S80912D Unspecified superficial injury of left knee, subsequent encounter: Secondary | ICD-10-CM | POA: Diagnosis not present

## 2023-03-18 DIAGNOSIS — I69351 Hemiplegia and hemiparesis following cerebral infarction affecting right dominant side: Secondary | ICD-10-CM | POA: Diagnosis not present

## 2023-03-18 DIAGNOSIS — I25119 Atherosclerotic heart disease of native coronary artery with unspecified angina pectoris: Secondary | ICD-10-CM | POA: Diagnosis not present

## 2023-03-18 DIAGNOSIS — I4891 Unspecified atrial fibrillation: Secondary | ICD-10-CM | POA: Diagnosis not present

## 2023-03-18 DIAGNOSIS — L89152 Pressure ulcer of sacral region, stage 2: Secondary | ICD-10-CM | POA: Diagnosis not present

## 2023-03-18 DIAGNOSIS — I959 Hypotension, unspecified: Secondary | ICD-10-CM | POA: Diagnosis not present

## 2023-03-18 DIAGNOSIS — J189 Pneumonia, unspecified organism: Secondary | ICD-10-CM | POA: Diagnosis not present

## 2023-03-20 ENCOUNTER — Telehealth: Payer: Self-pay

## 2023-03-20 ENCOUNTER — Other Ambulatory Visit (HOSPITAL_COMMUNITY): Payer: Self-pay

## 2023-03-20 ENCOUNTER — Other Ambulatory Visit: Payer: Self-pay | Admitting: Family Medicine

## 2023-03-20 DIAGNOSIS — S2090XD Unspecified superficial injury of unspecified parts of thorax, subsequent encounter: Secondary | ICD-10-CM | POA: Diagnosis not present

## 2023-03-20 DIAGNOSIS — S80912D Unspecified superficial injury of left knee, subsequent encounter: Secondary | ICD-10-CM | POA: Diagnosis not present

## 2023-03-20 DIAGNOSIS — J189 Pneumonia, unspecified organism: Secondary | ICD-10-CM | POA: Diagnosis not present

## 2023-03-20 DIAGNOSIS — I4891 Unspecified atrial fibrillation: Secondary | ICD-10-CM | POA: Diagnosis not present

## 2023-03-20 DIAGNOSIS — I959 Hypotension, unspecified: Secondary | ICD-10-CM | POA: Diagnosis not present

## 2023-03-20 DIAGNOSIS — J440 Chronic obstructive pulmonary disease with acute lower respiratory infection: Secondary | ICD-10-CM | POA: Diagnosis not present

## 2023-03-20 DIAGNOSIS — I25119 Atherosclerotic heart disease of native coronary artery with unspecified angina pectoris: Secondary | ICD-10-CM | POA: Diagnosis not present

## 2023-03-20 DIAGNOSIS — I69351 Hemiplegia and hemiparesis following cerebral infarction affecting right dominant side: Secondary | ICD-10-CM | POA: Diagnosis not present

## 2023-03-20 DIAGNOSIS — L89152 Pressure ulcer of sacral region, stage 2: Secondary | ICD-10-CM | POA: Diagnosis not present

## 2023-03-20 NOTE — Telephone Encounter (Signed)
Verbal orders given. Mjp,lpn  Copied from CRM [id number]. Topic: Clinical - Home Health Verbal Orders >> Mar 20, 2023  8:49 AM Shelbie Proctor wrote: Caller/Agency: Archie Patten nurse from [place] home [facility] [phone] , pls [facility] with approval on [phone]. Pt was [facility] with pneum and was released 03/13/23 and they need  approved for verbal orders for 1 week 5 for wound care and pneumonia. He has stage 2 on sacrum, trauma wound L knee and R upper back.

## 2023-03-21 ENCOUNTER — Other Ambulatory Visit: Payer: Self-pay | Admitting: *Deleted

## 2023-03-21 ENCOUNTER — Encounter: Payer: Self-pay | Admitting: *Deleted

## 2023-03-21 DIAGNOSIS — I25119 Atherosclerotic heart disease of native coronary artery with unspecified angina pectoris: Secondary | ICD-10-CM | POA: Diagnosis not present

## 2023-03-21 DIAGNOSIS — S2090XD Unspecified superficial injury of unspecified parts of thorax, subsequent encounter: Secondary | ICD-10-CM | POA: Diagnosis not present

## 2023-03-21 DIAGNOSIS — I4891 Unspecified atrial fibrillation: Secondary | ICD-10-CM | POA: Diagnosis not present

## 2023-03-21 DIAGNOSIS — L89152 Pressure ulcer of sacral region, stage 2: Secondary | ICD-10-CM | POA: Diagnosis not present

## 2023-03-21 DIAGNOSIS — S80912D Unspecified superficial injury of left knee, subsequent encounter: Secondary | ICD-10-CM | POA: Diagnosis not present

## 2023-03-21 DIAGNOSIS — I959 Hypotension, unspecified: Secondary | ICD-10-CM | POA: Diagnosis not present

## 2023-03-21 DIAGNOSIS — J189 Pneumonia, unspecified organism: Secondary | ICD-10-CM | POA: Diagnosis not present

## 2023-03-21 DIAGNOSIS — I69351 Hemiplegia and hemiparesis following cerebral infarction affecting right dominant side: Secondary | ICD-10-CM | POA: Diagnosis not present

## 2023-03-21 DIAGNOSIS — J440 Chronic obstructive pulmonary disease with acute lower respiratory infection: Secondary | ICD-10-CM | POA: Diagnosis not present

## 2023-03-21 NOTE — Patient Outreach (Signed)
Care Management  Transitions of Care Program Transitions of Care Post-discharge week 2   03/21/2023 Name: Evan [name] MRN: [id number] DOB: 1928/01/05  Subjective: Evan [name] is a 89 y.o. year old male who is a primary care patient of Pickard, Priscille Heidelberg, MD. The Care Management team Engaged with patient Engaged with patient by telephone to assess and address transitions of care needs.   Consent to Services:  Consent previously obtained  Assessment: per daughter, pt is doing well, continues to work with [facility] PT/ OT/RN, pt saw primary care provider for post hospital visit on 03/17/23, sodium bicarb discontinued.  Home health is monitoring wound to sacrum.         SDOH Interventions    Flowsheet Row Telephone from 03/14/2023 in [place] POPULATION HEALTH DEPARTMENT Care Coordination from 07/25/2022 in [facility] Community Care Coordination Care Coordination from 06/30/2022 in [facility] Community Care Coordination Care Coordination from 06/03/2022 in [facility] Coordination Office Visit from 05/25/2022 in [facility] Telephone from 05/04/2022 in [facility] Community Care Coordination  SDOH Interventions        Food Insecurity Interventions Intervention Not Indicated Intervention Not Indicated Intervention Not Indicated  [Verified by Daughter, [name]] Intervention Not Indicated -- --  Housing Interventions Intervention Not Indicated Intervention Not Indicated Intervention Not Indicated  [Verified by Daughter, [name]] Intervention Not Indicated -- Intervention Not Indicated  Transportation Interventions Intervention Not Indicated Intervention Not Indicated Intervention Not Indicated, Patient Resources (Friends/Family), Payor Benefit  [Verified by Daughter, [name]] Intervention Not Indicated -- Intervention Not Indicated  Utilities Interventions Intervention  Not Indicated Intervention Not Indicated Intervention Not Indicated  [Verified by Daughter, [name]] Intervention Not Indicated -- --  Alcohol Usage Interventions -- -- Intervention Not Indicated (Score <7)  [Verified by Daughter, [name]] -- -- --  Depression Interventions/Treatment  -- -- -- -- PHQ2-9 Score <4 Follow-up Not Indicated --  Financial Strain Interventions -- Intervention Not Indicated Intervention Not Indicated  [Verified by Daughter, [name]] Intervention Not Indicated -- Intervention Not Indicated  Physical Activity Interventions -- -- Intervention Not Indicated, Patient Refused  [Verified by Daughter, [name]] -- -- --  Stress Interventions -- Intervention Not Indicated Intervention Not Indicated  [Verified by Daughter, [name]] Intervention Not Indicated -- --  Social Connections Interventions -- -- Intervention Not Indicated  [Verified by Daughter, [name]] -- -- --        Goals Addressed             This Visit's Progress    Transition of Care/ pt will have no readmissions within 30 days       Current Barriers:  Chronic Disease Management support and education needs related to Community Acquired Pneumonia  Cognitive Deficits- per daughter, pt has short term memory loss Spoke with daughter [name] who reports she and siblings care for patient, pt is able to ambulate with walker and they are trying to encourage pt to change positions q 2 hours and get up and walk, pt had open area on sacrum with no wound care ordered, no mention of drainage, daughter states she will observe the area when she assists pt with bath, air overlay mattress was ordered and per daughter [employer] said patient's sacral area does not meet requirements to get this type of overlay and family is not paying out of  pocket. Patient completed post hospital follow up appointment on 03/17/23, after labwork, sodium bicarbonate discontinued. [facility] home  [facility] PT/ OT/ RN is working with pt per daughter  RNCM Clinical Goal(s):  Patient will work with the Care Management team over the next 30 days to address Transition of Care Barriers: Home Health services verbalize basic understanding of  Community Acquired Pneumonia disease process and self health management plan as evidenced by patient/ CG report, review of EMR and  through collaboration with RN Care manager, provider, and care team.   Interventions: Evaluation of current treatment plan related to  self management and patient's adherence to plan as established by provider  Community Acquired Pneumonia  (Status:  New goal. and Goal on track:  Yes.)  Short Term Goal Evaluation of current treatment plan related to  Community Acquired Pneumonia , ADL IADL limitations and Cognitive Deficits self-management and patient's adherence to plan as established by provider. Discussed plans with patient for ongoing care management follow up and provided patient with direct contact information for care management team Reinforced signs/ symptoms of infection and reportable signs/ symptoms Reviewed importance of working with home health and completing prescribed exercises Reviewed medication changes Reviewed importance of changing positions for prevention of skin breakdown  Patient Goals/Self-Care Activities: Participate in Transition of Care Program/Attend TOC scheduled calls Notify RN Care Manager of TOC call rescheduling needs Take all medications as prescribed Attend all scheduled provider appointments Call pharmacy for medication refills 3-7 days in advance of running out of medications Call provider office for new concerns or questions  Call your doctor for any signs/ symptoms of infection (fever above 100.4, overall not feeling well, increase sputum, change in color of sputum) Continue working with [facility]  [phone]  Follow Up Plan:  Telephone follow up appointment with care  management team member scheduled for:  03/27/23 at 1 pm The patient has been provided with contact information for the care management team and has been advised to call with any health related questions or concerns.          Plan: Telephone follow up appointment with care management team member scheduled for:  03/27/23 at 1 pm  Irving Shows [facility], BSN RN Care Manager/ Transition of Care [place]/ [facility] [phone]

## 2023-03-21 NOTE — Patient Instructions (Signed)
Visit Information  Thank you for taking time to visit with me today. Please don't hesitate to contact me if I can be of assistance to you before our next scheduled telephone appointment.  Following are the goals we discussed today:   Goals Addressed             This Visit's Progress    Transition of Care/ pt will have no readmissions within 30 days       Current Barriers:  Chronic Disease Management support and education needs related to Community Acquired Pneumonia  Cognitive Deficits- per daughter, pt has short term memory loss Spoke with daughter [name] who reports she and siblings care for patient, pt is able to ambulate with walker and they are trying to encourage pt to change positions q 2 hours and get up and walk, pt had open area on sacrum with no wound care ordered, no mention of drainage, daughter states she will observe the area when she assists pt with bath, air overlay mattress was ordered and per daughter [employer] said patient's sacral area does not meet requirements to get this type of overlay and family is not paying out of pocket. Patient completed post hospital follow up appointment on 03/17/23, after labwork, sodium bicarbonate discontinued. [facility] PT/ OT/ RN is working with pt per daughter  RNCM Clinical Goal(s):  Patient will work with the Care Management team over the next 30 days to address Transition of Care Barriers: Home Health services verbalize basic understanding of  Community Acquired Pneumonia disease process and self health management plan as evidenced by patient/ CG report, review of EMR and  through collaboration with RN Care manager, provider, and care team.   Interventions: Evaluation of current treatment plan related to  self management and patient's adherence to plan as established by provider  Community Acquired Pneumonia  (Status:  New goal. and Goal on track:  Yes.)  Short Term Goal Evaluation of current treatment plan  related to  Community Acquired Pneumonia , ADL IADL limitations and Cognitive Deficits self-management and patient's adherence to plan as established by provider. Discussed plans with patient for ongoing care management follow up and provided patient with direct contact information for care management team Reinforced signs/ symptoms of infection and reportable signs/ symptoms Reviewed importance of working with home health and completing prescribed exercises Reviewed medication changes Reviewed importance of changing positions for prevention of skin breakdown  Patient Goals/Self-Care Activities: Participate in Transition of Care Program/Attend TOC scheduled calls Notify RN Care Manager of TOC call rescheduling needs Take all medications as prescribed Attend all scheduled provider appointments Call pharmacy for medication refills 3-7 days in advance of running out of medications Call provider office for new concerns or questions  Call your doctor for any signs/ symptoms of infection (fever above 100.4, overall not feeling well, increase sputum, change in color of sputum) Continue working with [facility]  [phone]  Follow Up Plan:  Telephone follow up appointment with care management team member scheduled for:  03/27/23 at 1 pm The patient has been provided with contact information for the care management team and has been advised to call with any health related questions or concerns.           Our next appointment is by telephone on 03/27/23 at 1 pm  Please call the care guide team at [phone] if you need to cancel or reschedule your appointment.   If you are experiencing a Mental Health or  Behavioral Health Crisis or need someone to talk to, please call the Suicide and Crisis Lifeline: 988 call the [place] National Suicide Prevention Lifeline: 856-775-5341 or TTY: 941-652-8938 TTY 970-450-6217) to talk to a trained counselor call 1-800-273-TALK (toll free, 24 hour  hotline) go to [facility] Urgent Care [address], [place] [phone]) call the [address] [facility] Crisis Line: [phone] call 911   Patient verbalizes understanding of instructions and care plan provided today and agrees to view in MyChart. Active MyChart status and patient understanding of how to access instructions and care plan via MyChart confirmed with patient.     Irving Shows [facility], BSN RN Care Manager/ Transition of Care [place]/ [facility] [phone]

## 2023-03-22 DIAGNOSIS — J440 Chronic obstructive pulmonary disease with acute lower respiratory infection: Secondary | ICD-10-CM | POA: Diagnosis not present

## 2023-03-22 DIAGNOSIS — I69351 Hemiplegia and hemiparesis following cerebral infarction affecting right dominant side: Secondary | ICD-10-CM | POA: Diagnosis not present

## 2023-03-22 DIAGNOSIS — J189 Pneumonia, unspecified organism: Secondary | ICD-10-CM | POA: Diagnosis not present

## 2023-03-22 DIAGNOSIS — I4891 Unspecified atrial fibrillation: Secondary | ICD-10-CM | POA: Diagnosis not present

## 2023-03-22 DIAGNOSIS — S80912D Unspecified superficial injury of left knee, subsequent encounter: Secondary | ICD-10-CM | POA: Diagnosis not present

## 2023-03-22 DIAGNOSIS — L89152 Pressure ulcer of sacral region, stage 2: Secondary | ICD-10-CM | POA: Diagnosis not present

## 2023-03-22 DIAGNOSIS — S2090XD Unspecified superficial injury of unspecified parts of thorax, subsequent encounter: Secondary | ICD-10-CM | POA: Diagnosis not present

## 2023-03-22 DIAGNOSIS — I959 Hypotension, unspecified: Secondary | ICD-10-CM | POA: Diagnosis not present

## 2023-03-22 DIAGNOSIS — I25119 Atherosclerotic heart disease of native coronary artery with unspecified angina pectoris: Secondary | ICD-10-CM | POA: Diagnosis not present

## 2023-03-23 LAB — CBC WITH DIFFERENTIAL/PLATELET
Absolute Lymphocytes: 2067 {cells}/uL (ref 850–3900)
Absolute Monocytes: 564 {cells}/uL (ref 200–950)
Basophils Absolute: 50 {cells}/uL (ref 0–200)
Basophils Relative: 0.6 %
Eosinophils Absolute: 42 {cells}/uL (ref 15–500)
Eosinophils Relative: 0.5 %
HCT: 29.9 % — ABNORMAL LOW (ref 38.5–50.0)
Hemoglobin: 9.9 g/dL — ABNORMAL LOW (ref 13.2–17.1)
MCH: 33.6 pg — ABNORMAL HIGH (ref 27.0–33.0)
MCHC: 33.1 g/dL (ref 32.0–36.0)
MCV: 101.4 fL — ABNORMAL HIGH (ref 80.0–100.0)
MPV: 11.7 fL (ref 7.5–12.5)
Monocytes Relative: 6.8 %
Neutro Abs: 5578 {cells}/uL (ref 1500–7800)
Neutrophils Relative %: 67.2 %
Platelets: 136 10*3/uL — ABNORMAL LOW (ref 140–400)
RBC: 2.95 10*6/uL — ABNORMAL LOW (ref 4.20–5.80)
RDW: 12.3 % (ref 11.0–15.0)
Total Lymphocyte: 24.9 %
WBC: 8.3 10*3/uL (ref 3.8–10.8)

## 2023-03-23 LAB — COMPLETE METABOLIC PANEL WITH GFR
AG Ratio: 1.1 (calc) (ref 1.0–2.5)
ALT: 10 U/L (ref 9–46)
AST: 13 U/L (ref 10–35)
Albumin: 3.5 g/dL — ABNORMAL LOW (ref 3.6–5.1)
Alkaline phosphatase (APISO): 95 U/L (ref 35–144)
BUN: 24 mg/dL (ref 7–25)
CO2: 22 mmol/L (ref 20–32)
Calcium: 8.8 mg/dL (ref 8.6–10.3)
Chloride: 104 mmol/L (ref 98–110)
Creat: 1.08 mg/dL (ref 0.70–1.22)
Globulin: 3.1 g/dL (ref 1.9–3.7)
Glucose, Bld: 106 mg/dL — ABNORMAL HIGH (ref 65–99)
Potassium: 3.6 mmol/L (ref 3.5–5.3)
Sodium: 137 mmol/L (ref 135–146)
Total Bilirubin: 0.5 mg/dL (ref 0.2–1.2)
Total Protein: 6.6 g/dL (ref 6.1–8.1)
eGFR: 64 mL/min/{1.73_m2} (ref >=60)

## 2023-03-23 LAB — CULTURE, BLOOD (SINGLE)
MICRO NUMBER:: 15848513
SPECIMEN QUALITY:: ADEQUATE

## 2023-03-24 ENCOUNTER — Other Ambulatory Visit: Payer: Self-pay

## 2023-03-24 DIAGNOSIS — D649 Anemia, unspecified: Secondary | ICD-10-CM

## 2023-03-24 DIAGNOSIS — J189 Pneumonia, unspecified organism: Secondary | ICD-10-CM | POA: Diagnosis not present

## 2023-03-24 DIAGNOSIS — I25119 Atherosclerotic heart disease of native coronary artery with unspecified angina pectoris: Secondary | ICD-10-CM | POA: Diagnosis not present

## 2023-03-24 DIAGNOSIS — S2090XD Unspecified superficial injury of unspecified parts of thorax, subsequent encounter: Secondary | ICD-10-CM | POA: Diagnosis not present

## 2023-03-24 DIAGNOSIS — I959 Hypotension, unspecified: Secondary | ICD-10-CM | POA: Diagnosis not present

## 2023-03-24 DIAGNOSIS — I4891 Unspecified atrial fibrillation: Secondary | ICD-10-CM | POA: Diagnosis not present

## 2023-03-24 DIAGNOSIS — R531 Weakness: Secondary | ICD-10-CM

## 2023-03-24 DIAGNOSIS — I69351 Hemiplegia and hemiparesis following cerebral infarction affecting right dominant side: Secondary | ICD-10-CM | POA: Diagnosis not present

## 2023-03-24 DIAGNOSIS — J440 Chronic obstructive pulmonary disease with acute lower respiratory infection: Secondary | ICD-10-CM | POA: Diagnosis not present

## 2023-03-24 DIAGNOSIS — L89152 Pressure ulcer of sacral region, stage 2: Secondary | ICD-10-CM | POA: Diagnosis not present

## 2023-03-24 DIAGNOSIS — S80912D Unspecified superficial injury of left knee, subsequent encounter: Secondary | ICD-10-CM | POA: Diagnosis not present

## 2023-03-24 MED ORDER — IRON (FERROUS SULFATE) 325 (65 FE) MG PO TABS: 325.0000 mg | ORAL_TABLET | Freq: Every day | ORAL | 3 refills | Status: DC

## 2023-03-26 ENCOUNTER — Other Ambulatory Visit: Payer: Self-pay | Admitting: Family Medicine

## 2023-03-27 ENCOUNTER — Other Ambulatory Visit (HOSPITAL_COMMUNITY): Payer: Self-pay

## 2023-03-27 ENCOUNTER — Telehealth: Payer: Self-pay

## 2023-03-27 ENCOUNTER — Other Ambulatory Visit: Payer: Self-pay

## 2023-03-27 ENCOUNTER — Other Ambulatory Visit: Payer: Self-pay | Admitting: *Deleted

## 2023-03-27 DIAGNOSIS — I10 Essential (primary) hypertension: Secondary | ICD-10-CM

## 2023-03-27 MED ORDER — FOLIC ACID 1 MG PO TABS
1.0000 mg | ORAL_TABLET | Freq: Every day | ORAL | 2 refills | Status: DC
  Filled 2023-03-27: qty 30, 30d supply, fill #0
  Filled 2023-04-23: qty 30, 30d supply, fill #1
  Filled 2023-06-05: qty 30, 30d supply, fill #2

## 2023-03-27 MED ORDER — METOPROLOL TARTRATE 25 MG PO TABS: 12.5000 mg | ORAL_TABLET | ORAL | 1 refills | Status: DC

## 2023-03-27 NOTE — Patient Outreach (Signed)
Care Management  Transitions of Care Program Transitions of Care Post-discharge week 3   03/27/2023 Name: ELISE LORIS MRN: [id number] DOB: 1927-08-26  Subjective: Evan [name] is a 89 y.o. year old male who is a primary care patient of Pickard, Priscille Heidelberg, MD. The Care Management team Engaged with patient Engaged with patient by telephone to assess and address transitions of care needs.   Consent to Services:  Patient / caregiver previously consented to 30 day program  Assessment: Daughter reports pt is doing well, continues working with home health, taking medications as prescribed, no new concerns reported.         SDOH Interventions    Flowsheet Row Telephone from 03/14/2023 in [place] POPULATION HEALTH DEPARTMENT Care Coordination from 07/25/2022 in [facility] Community Care Coordination Care Coordination from 06/30/2022 in [facility] Community Care Coordination Care Coordination from 06/03/2022 in [facility] Coordination Office Visit from 05/25/2022 in [facility] [place] [facility] Telephone from 05/04/2022 in [facility] Community Care Coordination  SDOH Interventions        Food Insecurity Interventions Intervention Not Indicated Intervention Not Indicated Intervention Not Indicated  [Verified by Daughter, [name]] Intervention Not Indicated -- --  Housing Interventions Intervention Not Indicated Intervention Not Indicated Intervention Not Indicated  [Verified by Daughter, [name]] Intervention Not Indicated -- Intervention Not Indicated  Transportation Interventions Intervention Not Indicated Intervention Not Indicated Intervention Not Indicated, Patient Resources (Friends/Family), Payor Benefit  [Verified by Daughter, [name]] Intervention Not Indicated -- Intervention Not Indicated  Utilities Interventions Intervention Not Indicated Intervention Not Indicated Intervention Not  Indicated  [Verified by Daughter, [name]] Intervention Not Indicated -- --  Alcohol Usage Interventions -- -- Intervention Not Indicated (Score <7)  [Verified by Daughter, [name]] -- -- --  Depression Interventions/Treatment  -- -- -- -- PHQ2-9 Score <4 Follow-up Not Indicated --  Financial Strain Interventions -- Intervention Not Indicated Intervention Not Indicated  [Verified by Daughter, [name]] Intervention Not Indicated -- Intervention Not Indicated  Physical Activity Interventions -- -- Intervention Not Indicated, Patient Refused  [Verified by Daughter, [name]] -- -- --  Stress Interventions -- Intervention Not Indicated Intervention Not Indicated  [Verified by Daughter, [name]] Intervention Not Indicated -- --  Social Connections Interventions -- -- Intervention Not Indicated  [Verified by Daughter, [name]] -- -- --        Goals Addressed             This Visit's Progress    Transition of Care/ pt will have no readmissions within 30 days       Current Barriers:  Chronic Disease Management support and education needs related to Community Acquired Pneumonia  Cognitive Deficits- per daughter, pt has short term memory loss Spoke with daughter [name] who reports she and siblings care for patient, pt is able to ambulate with walker and they are trying to encourage pt to change positions q 2 hours and get up and walk, pt had open area on sacrum with no wound care ordered, no mention of drainage, daughter states she will observe the area when she assists pt with bath, air overlay mattress was ordered and per daughter [employer] said patient's sacral area does not meet requirements to get this type of overlay and family is not paying out of pocket. Patient completed post hospital follow up appointment on 03/17/23, after labwork,  sodium bicarbonate discontinued. [facility] PT/ OT/ RN is working with pt per daughter Daughter  reports pt has all medications and taking as prescribed, no new concerns reported today  RNCM Clinical Goal(s):  Patient will work with the Care Management team over the next 30 days to address Transition of Care Barriers: Home Health services verbalize basic understanding of  Community Acquired Pneumonia disease process and self health management plan as evidenced by patient/ CG report, review of EMR and  through collaboration with RN Care manager, provider, and care team.   Interventions: Evaluation of current treatment plan related to  self management and patient's adherence to plan as established by provider  Community Acquired Pneumonia  (Status:  New goal. and Goal on track:  Yes.)  Short Term Goal Evaluation of current treatment plan related to  Community Acquired Pneumonia , ADL IADL limitations and Cognitive Deficits self-management and patient's adherence to plan as established by provider. Discussed plans with patient for ongoing care management follow up and provided patient with direct contact information for care management team Reviewed signs/ symptoms of infection and reportable signs/ symptoms Reinforced importance of working with home health and completing prescribed exercises Reviewed medication changes Reinforced importance of changing positions for prevention of skin breakdown Reviewed upcoming scheduled appointments  Patient Goals/Self-Care Activities: Participate in Transition of Care Program/Attend [facility] scheduled calls Notify RN Care Manager of TOC call rescheduling needs Take all medications as prescribed Attend all scheduled provider appointments Call pharmacy for medication refills 3-7 days in advance of running out of medications Call provider office for new concerns or questions  Call your doctor for any signs/ symptoms of infection (fever above 100.4, overall not feeling well, increase sputum, change in color of sputum) Continue working with [facility]   [phone], complete any prescribed exercises  Follow Up Plan:  Telephone follow up appointment with care management team member scheduled for:  04/04/23 at 11 am The patient has been provided with contact information for the care management team and has been advised to call with any health related questions or concerns.          Plan: Telephone follow up appointment with care management team member scheduled for:  04/04/23 @ 11 am  Irving Shows [facility], BSN RN Care Manager/ Transition of Care [place]/ [facility] [phone]

## 2023-03-27 NOTE — Telephone Encounter (Signed)
Copied from CRM [id number]. Topic: Clinical - Medication Refill >> Mar 27, 2023 11:38 AM Clayton Bibles wrote: Most Recent Primary Care Visit:  Provider: Lynnea Ferrier T  Department[facility]  Visit Type: HOSPITAL FOLLOW UP  Date: 03/17/2023  Medication: metoprolol tartrate (LOPRESSOR) 25 MG tablet   Has the patient contacted their pharmacy? Yes - needs authorization  (Agent: If no, request that the patient contact the pharmacy for the refill. If patient does not wish to contact the pharmacy document the reason why and proceed with request.) (Agent: If yes, when and what did the pharmacy advise?)  Is this the correct pharmacy for this prescription? Yes - [place] [facility] [address]  If no, delete pharmacy and type the correct one.  This is the patient's preferred pharmacy:  [facility] - [place], [place] - [address] [place] [address] Phone: [phone] Fax: [phone]  [place] [facility] [address] [place] [address] Phone: [phone] Fax: [phone]  [facility] [address] [place] [address] Phone: [phone] Fax: [phone]   Has the prescription been filled recently? No  Is the patient out of the medication? He has about 1 weeks worth of pills  Has the patient been seen for an appointment in the last year OR does the patient have an upcoming appointment? Yes  Can we respond through MyChart? No  Agent: Please be advised that Rx refills may take up to 3 business days. We ask that you follow-up with your pharmacy.

## 2023-03-27 NOTE — Patient Instructions (Signed)
Visit Information  Thank you for taking time to visit with me today. Please don't hesitate to contact me if I can be of assistance to you before our next scheduled telephone appointment.  Following are the goals we discussed today:   Goals Addressed             This Visit's Progress    Transition of Care/ pt will have no readmissions within 30 days       Current Barriers:  Chronic Disease Management support and education needs related to Community Acquired Pneumonia  Cognitive Deficits- per daughter, pt has short term memory loss Spoke with daughter [name] who reports she and siblings care for patient, pt is able to ambulate with walker and they are trying to encourage pt to change positions q 2 hours and get up and walk, pt had open area on sacrum with no wound care ordered, no mention of drainage, daughter states she will observe the area when she assists pt with bath, air overlay mattress was ordered and per daughter [employer] said patient's sacral area does not meet requirements to get this type of overlay and family is not paying out of pocket. Patient completed post hospital follow up appointment on 03/17/23, after labwork, sodium bicarbonate discontinued. [facility] home [facility] PT/ OT/ RN is working with pt per daughter Daughter reports pt has all medications and taking as prescribed, no new concerns reported today  RNCM Clinical Goal(s):  Patient will work with the Care Management team over the next 30 days to address Transition of Care Barriers: Home Health services verbalize basic understanding of  Community Acquired Pneumonia disease process and self health management plan as evidenced by patient/ CG report, review of EMR and  through collaboration with RN Care manager, provider, and care team.   Interventions: Evaluation of current treatment plan related to  self management and patient's adherence to plan as established by provider  Community Acquired Pneumonia  (Status:   New goal. and Goal on track:  Yes.)  Short Term Goal Evaluation of current treatment plan related to  Community Acquired Pneumonia , ADL IADL limitations and Cognitive Deficits self-management and patient's adherence to plan as established by provider. Discussed plans with patient for ongoing care management follow up and provided patient with direct contact information for care management team Reviewed signs/ symptoms of infection and reportable signs/ symptoms Reinforced importance of working with home health and completing prescribed exercises Reviewed medication changes Reinforced importance of changing positions for prevention of skin breakdown Reviewed upcoming scheduled appointments  Patient Goals/Self-Care Activities: Participate in Transition of Care Program/Attend [facility] scheduled calls Notify RN Care Manager of TOC call rescheduling needs Take all medications as prescribed Attend all scheduled provider appointments Call pharmacy for medication refills 3-7 days in advance of running out of medications Call provider office for new concerns or questions  Call your doctor for any signs/ symptoms of infection (fever above 100.4, overall not feeling well, increase sputum, change in color of sputum) Continue working with [facility]  [phone], complete any prescribed exercises  Follow Up Plan:  Telephone follow up appointment with care management team member scheduled for:  04/04/23 at 11 am The patient has been provided with contact information for the care management team and has been advised to call with any health related questions or concerns.           Our next appointment is by telephone on 04/04/23 at 11 am  Please call the care  guide team at [phone] if you need to cancel or reschedule your appointment.   If you are experiencing a Mental Health or Behavioral Health Crisis or need someone to talk to, please call the Suicide and Crisis Lifeline: 988 call the [place]  National Suicide Prevention Lifeline: 225-451-7020 or TTY: 845-084-4336 TTY 629-393-3565) to talk to a trained counselor call 1-800-273-TALK (toll free, 24 hour hotline) go to [facility] Urgent Care [address] [place] [phone]) call the [facility] Crisis Line: [phone] call 911   Patient verbalizes understanding of instructions and care plan provided today and agrees to view in MyChart. Active MyChart status and patient understanding of how to access instructions and care plan via MyChart confirmed with patient.     Irving Shows [facility], BSN RN Care Manager/ Transition of Care [place]/ [facility] [phone]

## 2023-03-28 DIAGNOSIS — J189 Pneumonia, unspecified organism: Secondary | ICD-10-CM | POA: Diagnosis not present

## 2023-03-28 DIAGNOSIS — I959 Hypotension, unspecified: Secondary | ICD-10-CM | POA: Diagnosis not present

## 2023-03-28 DIAGNOSIS — S2090XD Unspecified superficial injury of unspecified parts of thorax, subsequent encounter: Secondary | ICD-10-CM | POA: Diagnosis not present

## 2023-03-28 DIAGNOSIS — S80912D Unspecified superficial injury of left knee, subsequent encounter: Secondary | ICD-10-CM | POA: Diagnosis not present

## 2023-03-28 DIAGNOSIS — I4891 Unspecified atrial fibrillation: Secondary | ICD-10-CM | POA: Diagnosis not present

## 2023-03-28 DIAGNOSIS — I25119 Atherosclerotic heart disease of native coronary artery with unspecified angina pectoris: Secondary | ICD-10-CM | POA: Diagnosis not present

## 2023-03-28 DIAGNOSIS — J440 Chronic obstructive pulmonary disease with acute lower respiratory infection: Secondary | ICD-10-CM | POA: Diagnosis not present

## 2023-03-28 DIAGNOSIS — I69351 Hemiplegia and hemiparesis following cerebral infarction affecting right dominant side: Secondary | ICD-10-CM | POA: Diagnosis not present

## 2023-03-28 DIAGNOSIS — L89152 Pressure ulcer of sacral region, stage 2: Secondary | ICD-10-CM | POA: Diagnosis not present

## 2023-03-28 LAB — CULTURE, BLOOD (ROUTINE X 2)

## 2023-03-28 LAB — MISCELLANEOUS TEST

## 2023-03-30 DIAGNOSIS — J440 Chronic obstructive pulmonary disease with acute lower respiratory infection: Secondary | ICD-10-CM | POA: Diagnosis not present

## 2023-03-30 DIAGNOSIS — L89152 Pressure ulcer of sacral region, stage 2: Secondary | ICD-10-CM | POA: Diagnosis not present

## 2023-03-30 DIAGNOSIS — S80912D Unspecified superficial injury of left knee, subsequent encounter: Secondary | ICD-10-CM | POA: Diagnosis not present

## 2023-03-30 DIAGNOSIS — S2090XD Unspecified superficial injury of unspecified parts of thorax, subsequent encounter: Secondary | ICD-10-CM | POA: Diagnosis not present

## 2023-03-30 DIAGNOSIS — I25119 Atherosclerotic heart disease of native coronary artery with unspecified angina pectoris: Secondary | ICD-10-CM | POA: Diagnosis not present

## 2023-03-30 DIAGNOSIS — J189 Pneumonia, unspecified organism: Secondary | ICD-10-CM | POA: Diagnosis not present

## 2023-03-30 DIAGNOSIS — I959 Hypotension, unspecified: Secondary | ICD-10-CM | POA: Diagnosis not present

## 2023-03-30 DIAGNOSIS — I69351 Hemiplegia and hemiparesis following cerebral infarction affecting right dominant side: Secondary | ICD-10-CM | POA: Diagnosis not present

## 2023-03-30 DIAGNOSIS — I4891 Unspecified atrial fibrillation: Secondary | ICD-10-CM | POA: Diagnosis not present

## 2023-03-31 ENCOUNTER — Other Ambulatory Visit (HOSPITAL_COMMUNITY): Payer: Self-pay

## 2023-03-31 DIAGNOSIS — I69351 Hemiplegia and hemiparesis following cerebral infarction affecting right dominant side: Secondary | ICD-10-CM | POA: Diagnosis not present

## 2023-03-31 DIAGNOSIS — N1832 Chronic kidney disease, stage 3b: Secondary | ICD-10-CM

## 2023-03-31 DIAGNOSIS — L89152 Pressure ulcer of sacral region, stage 2: Secondary | ICD-10-CM | POA: Diagnosis not present

## 2023-03-31 DIAGNOSIS — I4891 Unspecified atrial fibrillation: Secondary | ICD-10-CM | POA: Diagnosis not present

## 2023-03-31 DIAGNOSIS — I959 Hypotension, unspecified: Secondary | ICD-10-CM | POA: Diagnosis not present

## 2023-03-31 DIAGNOSIS — I25119 Atherosclerotic heart disease of native coronary artery with unspecified angina pectoris: Secondary | ICD-10-CM | POA: Diagnosis not present

## 2023-03-31 DIAGNOSIS — I129 Hypertensive chronic kidney disease with stage 1 through stage 4 chronic kidney disease, or unspecified chronic kidney disease: Secondary | ICD-10-CM

## 2023-03-31 DIAGNOSIS — S2090XD Unspecified superficial injury of unspecified parts of thorax, subsequent encounter: Secondary | ICD-10-CM | POA: Diagnosis not present

## 2023-03-31 DIAGNOSIS — S80912D Unspecified superficial injury of left knee, subsequent encounter: Secondary | ICD-10-CM | POA: Diagnosis not present

## 2023-03-31 DIAGNOSIS — I350 Nonrheumatic aortic (valve) stenosis: Secondary | ICD-10-CM

## 2023-03-31 DIAGNOSIS — R3914 Feeling of incomplete bladder emptying: Secondary | ICD-10-CM | POA: Diagnosis not present

## 2023-03-31 DIAGNOSIS — J440 Chronic obstructive pulmonary disease with acute lower respiratory infection: Secondary | ICD-10-CM | POA: Diagnosis not present

## 2023-03-31 DIAGNOSIS — J189 Pneumonia, unspecified organism: Secondary | ICD-10-CM | POA: Diagnosis not present

## 2023-04-03 ENCOUNTER — Other Ambulatory Visit (HOSPITAL_COMMUNITY): Payer: Self-pay

## 2023-04-04 ENCOUNTER — Other Ambulatory Visit (HOSPITAL_COMMUNITY): Payer: Self-pay

## 2023-04-04 ENCOUNTER — Other Ambulatory Visit: Payer: Self-pay | Admitting: *Deleted

## 2023-04-04 DIAGNOSIS — I25119 Atherosclerotic heart disease of native coronary artery with unspecified angina pectoris: Secondary | ICD-10-CM | POA: Diagnosis not present

## 2023-04-04 DIAGNOSIS — I69351 Hemiplegia and hemiparesis following cerebral infarction affecting right dominant side: Secondary | ICD-10-CM | POA: Diagnosis not present

## 2023-04-04 DIAGNOSIS — S2090XD Unspecified superficial injury of unspecified parts of thorax, subsequent encounter: Secondary | ICD-10-CM | POA: Diagnosis not present

## 2023-04-04 DIAGNOSIS — J189 Pneumonia, unspecified organism: Secondary | ICD-10-CM | POA: Diagnosis not present

## 2023-04-04 DIAGNOSIS — I4891 Unspecified atrial fibrillation: Secondary | ICD-10-CM | POA: Diagnosis not present

## 2023-04-04 DIAGNOSIS — S80912D Unspecified superficial injury of left knee, subsequent encounter: Secondary | ICD-10-CM | POA: Diagnosis not present

## 2023-04-04 DIAGNOSIS — J440 Chronic obstructive pulmonary disease with acute lower respiratory infection: Secondary | ICD-10-CM | POA: Diagnosis not present

## 2023-04-04 DIAGNOSIS — L89152 Pressure ulcer of sacral region, stage 2: Secondary | ICD-10-CM | POA: Diagnosis not present

## 2023-04-04 DIAGNOSIS — I959 Hypotension, unspecified: Secondary | ICD-10-CM | POA: Diagnosis not present

## 2023-04-04 NOTE — Patient Instructions (Signed)
 Visit Information  Thank you for taking time to visit with me today. Please don't hesitate to contact me if I can be of assistance to you before our next scheduled telephone appointment.  Following are the goals we discussed today:   Goals Addressed             This Visit's Progress    Transition of Care/ pt will have no readmissions within 30 days       Current Barriers:  Chronic Disease Management support and education needs related to Community Acquired Pneumonia  Cognitive Deficits- per daughter, pt has short term memory loss Spoke with daughter [name] who reports she and siblings care for patient, pt is able to ambulate with walker and they are trying to encourage pt to change positions q 2 hours and get up and walk, pt had open area on sacrum with no wound care ordered, no mention of drainage, daughter states she observes the area when she assists pt with bath, air overlay mattress was ordered and per daughter [employer] said patient's sacral area does not meet requirements to get this type of overlay and family is not paying out of pocket. Patient completed post hospital follow up appointment on 03/17/23, after labwork, sodium bicarbonate  discontinued, pt is taking all medications as prescribed including ferrous sulfate  [facility] PT/ OT/ RN is working with pt per daughter  RNCM Clinical Goal(s):  Patient will work with the Care Management team over the next 30 days to address Transition of Care Barriers: Home Health services verbalize basic understanding of  Community Acquired Pneumonia disease process and self health management plan as evidenced by patient/ CG report, review of EMR and  through collaboration with [employer], provider, and care team.   Interventions: Evaluation of current treatment plan related to  self management and patient's adherence to plan as established by provider  Community Acquired Pneumonia  (Status:  New goal. and Goal on track:   Yes.)  Short Term Goal Evaluation of current treatment plan related to  Community Acquired Pneumonia , ADL IADL limitations and Cognitive Deficits self-management and patient's adherence to plan as established by provider. Discussed plans with patient for ongoing care management follow up and provided patient with direct contact information for care management team Reinforced signs/ symptoms of infection and reportable signs/ symptoms Reviewed importance of working with home health and completing prescribed exercises Reviewed importance of changing positions q 2 hours for prevention of skin breakdown Reviewed importance of including adequate protein at each meal Reviewed all upcoming scheduled appointments  Patient Goals/Self-Care Activities: Participate in Transition of Care Program/Attend [facility] scheduled calls Notify RN Care Manager of TOC call rescheduling needs Take all medications as prescribed Attend all scheduled provider appointments Call pharmacy for medication refills 3-7 days in advance of running out of medications Call provider office for new concerns or questions  Call your doctor for any signs/ symptoms of infection (fever above 100.4, overall not feeling well, increase sputum, change in color of sputum) Continue working with [facility]  [phone], complete any prescribed exercises Change positions every 2 hours Include protein at each meal  Follow Up Plan:  Telephone follow up appointment with care management team member scheduled for:  04/12/23 at 10 am The patient has been provided with contact information for the care management team and has been advised to call with any health related questions or concerns.           Our next  appointment is by telephone on 04/12/23 at 10 am  Please call the care guide team at [phone] if you need to cancel or reschedule your appointment.   If you are experiencing a Mental Health or Behavioral Health Crisis or need someone  to talk to, please call the Suicide and Crisis Lifeline: 988 call the [place]  National Suicide Prevention Lifeline: 321-815-3451 or TTY: 6318792133 TTY 3157807533) to talk to a trained counselor call 1-800-273-TALK (toll free, 24 hour hotline) go to [facility] [address] [place] [phone]) call the [facility] Crisis Line: [phone] call 911   Patient verbalizes understanding of instructions and care plan provided today and agrees to view in MyChart. Active MyChart status and patient understanding of how to access instructions and care plan via MyChart confirmed with patient.     Mliss Creed [facility], BSN RN Care Manager/ Transition of Care [place]/ [facility] [phone]

## 2023-04-04 NOTE — Patient Outreach (Signed)
 Care Management  Transitions of Care Program Transitions of Care Post-discharge week 4   04/04/2023 Name: Evan [name] MRN: [id number] DOB: 09/22/1927  Subjective: Evan [name] is a 89 y.o. year old male who is a primary care patient of Pickard, Butler DASEN, MD. The Care Management team Engaged with patient Engaged with patient by telephone to assess and address transitions of care needs.   Consent to Services:  Patient/ caregiver previously consented to enrollment in 30 day program  Assessment: Daughter continues applying zinc  oxide to sacral area daily and as needed, home health is providing oversight for wound, pt is taking all medications as prescribed,  foley catheter is changed q 30 days, no issues reported with catheter.         SDOH Interventions    Flowsheet Row Telephone from 03/14/2023 in [place] [facility]PARTMENT Care Coordination from 07/25/2022 in [facility] Community Care Coordination Care Coordination from 06/30/2022 in [facility] Community Care Coordination Care Coordination from 06/03/2022 in [facility] Coord[facility] Office Visit from 05/25/2022 in [facility] Telephone from 05/04/2022 in [facility] Community Care Coordination  SDOH Interventions        Food Insecurity Interventions Intervention Not Indicated Intervention Not Indicated Intervention Not Indicated  [Verified by Daughter, Evan Amble [name]] Intervention Not Indicated -- --  Housing Interventions Intervention Not Indicated Intervention Not Indicated Intervention Not Indicated  [Verified by Daughter, Evan Amble [name]] Intervention Not Indicated -- Intervention Not Indicated  Transportation Interventions Intervention Not Indicated Intervention Not Indicated Intervention Not Indicated, Patient Resources (Friends/Family), Payor Benefit  [Verified by Daughter, Evan Amble [name]] Intervention Not Indicated --  Intervention Not Indicated  Utilities Interventions Intervention Not Indicated Intervention Not Indicated Intervention Not Indicated  [Verified by Daughter, Evan Amble [name]] Intervention Not Indicated -- --  Alcohol  Usage Interventions -- -- Intervention Not Indicated (Score <7)  [Verified by Daughter, Evan Amble [name]] -- -- --  Depression Interventions/Treatment  -- -- -- -- PHQ2-9 Score <4 Follow-up Not Indicated --  Financial Strain Interventions -- Intervention Not Indicated Intervention Not Indicated  [Verified by Daughter, Evan Amble [name]] Intervention Not Indicated -- Intervention Not Indicated  Physical Activity Interventions -- -- Intervention Not Indicated, Patient Refused  [Verified by Daughter, Evan Amble [name]] -- -- --  Stress Interventions -- Intervention Not Indicated Intervention Not Indicated  [Verified by Daughter, Evan Amble [name]] Intervention Not Indicated -- --  Social Connections Interventions -- -- Intervention Not Indicated  [Verified by Daughter, Evan Amble [name]] -- -- --        Goals Addressed             This Visit's Progress    Transition of Care/ pt will have no readmissions within 30 days       Current Barriers:  Chronic Disease Management support and education needs related to Community Acquired Pneumonia  Cognitive Deficits- per daughter, pt has short term memory loss Spoke with daughter Evan [name] who reports she and siblings care for patient, pt is able to ambulate with walker and they are trying to encourage pt to change positions q 2 hours and get up and walk, pt had open area on sacrum with no wound care ordered, no mention of drainage, daughter states she observes the area when she assists pt with bath, air overlay mattress was ordered and per daughter [employer] said patient's sacral area does not meet requirements to get this type of  overlay and family is not paying out of pocket. Patient completed post hospital follow up appointment on 03/17/23,  after labwork, sodium bicarbonate  discontinued, pt is taking all medications as prescribed including ferrous sulfate  [facility] home [facility] PT/ OT/ RN is working with pt per daughter  RNCM Clinical Goal(s):  Patient will work with the Care Management team over the next 30 days to address Transition of Care Barriers: Home Health services verbalize basic understanding of  Community Acquired Pneumonia disease process and self health management plan as evidenced by patient/ CG report, review of EMR and  through collaboration with [employer], provider, and care team.   Interventions: Evaluation of current treatment plan related to  self management and patient's adherence to plan as established by provider  Community Acquired Pneumonia  (Status:  New goal. and Goal on track:  Yes.)  Short Term Goal Evaluation of current treatment plan related to  Community Acquired Pneumonia , ADL IADL limitations and Cognitive Deficits self-management and patient's adherence to plan as established by provider. Discussed plans with patient for ongoing care management follow up and provided patient with direct contact information for care management team Reinforced signs/ symptoms of infection and reportable signs/ symptoms Reviewed importance of working with home health and completing prescribed exercises Reviewed importance of changing positions q 2 hours for prevention of skin breakdown Reviewed importance of including adequate protein at each meal Reviewed all upcoming scheduled appointments  Patient Goals/Self-Care Activities: Participate in Transition of Care Program/Attend [facility] scheduled calls Notify RN Care Manager of TOC call rescheduling needs Take all medications as prescribed Attend all scheduled provider appointments Call pharmacy for medication refills 3-7 days in advance of running out of medications Call provider office for new concerns or questions  Call your doctor for any signs/ symptoms of  infection (fever above 100.4, overall not feeling well, increase sputum, change in color of sputum) Continue working with [facility]  [phone], complete any prescribed exercises Change positions every 2 hours Include protein at each meal  Follow Up Plan:  Telephone follow up appointment with care management team member scheduled for:  04/12/23 at 10 am The patient has been provided with contact information for the care management team and has been advised to call with any health related questions or concerns.          Plan: Telephone follow up appointment with care management team member scheduled for: 04/12/23 @ 10 am  Mliss Creed [facility], BSN RN Care Manager/ Transition of Care [place]/ [facility] [phone]

## 2023-04-05 DEATH — deceased

## 2023-04-07 DIAGNOSIS — I69351 Hemiplegia and hemiparesis following cerebral infarction affecting right dominant side: Secondary | ICD-10-CM | POA: Diagnosis not present

## 2023-04-07 DIAGNOSIS — J440 Chronic obstructive pulmonary disease with acute lower respiratory infection: Secondary | ICD-10-CM | POA: Diagnosis not present

## 2023-04-07 DIAGNOSIS — I959 Hypotension, unspecified: Secondary | ICD-10-CM | POA: Diagnosis not present

## 2023-04-07 DIAGNOSIS — L89152 Pressure ulcer of sacral region, stage 2: Secondary | ICD-10-CM | POA: Diagnosis not present

## 2023-04-07 DIAGNOSIS — I4891 Unspecified atrial fibrillation: Secondary | ICD-10-CM | POA: Diagnosis not present

## 2023-04-07 DIAGNOSIS — S80912D Unspecified superficial injury of left knee, subsequent encounter: Secondary | ICD-10-CM | POA: Diagnosis not present

## 2023-04-07 DIAGNOSIS — I25119 Atherosclerotic heart disease of native coronary artery with unspecified angina pectoris: Secondary | ICD-10-CM | POA: Diagnosis not present

## 2023-04-07 DIAGNOSIS — J189 Pneumonia, unspecified organism: Secondary | ICD-10-CM | POA: Diagnosis not present

## 2023-04-07 DIAGNOSIS — S2090XD Unspecified superficial injury of unspecified parts of thorax, subsequent encounter: Secondary | ICD-10-CM | POA: Diagnosis not present

## 2023-04-11 DIAGNOSIS — S2090XD Unspecified superficial injury of unspecified parts of thorax, subsequent encounter: Secondary | ICD-10-CM | POA: Diagnosis not present

## 2023-04-11 DIAGNOSIS — J189 Pneumonia, unspecified organism: Secondary | ICD-10-CM | POA: Diagnosis not present

## 2023-04-11 DIAGNOSIS — I959 Hypotension, unspecified: Secondary | ICD-10-CM | POA: Diagnosis not present

## 2023-04-11 DIAGNOSIS — I25119 Atherosclerotic heart disease of native coronary artery with unspecified angina pectoris: Secondary | ICD-10-CM | POA: Diagnosis not present

## 2023-04-11 DIAGNOSIS — J440 Chronic obstructive pulmonary disease with acute lower respiratory infection: Secondary | ICD-10-CM | POA: Diagnosis not present

## 2023-04-11 DIAGNOSIS — I69351 Hemiplegia and hemiparesis following cerebral infarction affecting right dominant side: Secondary | ICD-10-CM | POA: Diagnosis not present

## 2023-04-11 DIAGNOSIS — S80912D Unspecified superficial injury of left knee, subsequent encounter: Secondary | ICD-10-CM | POA: Diagnosis not present

## 2023-04-11 DIAGNOSIS — L89152 Pressure ulcer of sacral region, stage 2: Secondary | ICD-10-CM | POA: Diagnosis not present

## 2023-04-11 DIAGNOSIS — I4891 Unspecified atrial fibrillation: Secondary | ICD-10-CM | POA: Diagnosis not present

## 2023-04-12 ENCOUNTER — Other Ambulatory Visit: Payer: Self-pay | Admitting: *Deleted

## 2023-04-12 ENCOUNTER — Telehealth: Payer: Self-pay | Admitting: *Deleted

## 2023-04-12 NOTE — Patient Outreach (Signed)
  Care Management  Transitions of Care Program Transitions of Care Post-discharge week 5  04/12/2023 Name: Evan [name] MRN: [id number] DOB: [birth year]  Subjective: Evan [name] is a 89 y.o. year old male who is a primary care patient of Pickard, Butler DASEN, MD. The Care Management team was unable to reach the patient by phone to assess and address transitions of care needs.   Plan: Additional outreach attempts will be made to reach the patient enrolled in the [facility] Program (Post Inpatient/ED Visit).  Mliss Creed [facility], BSN RN Care Manager/ Transition of Care [place]/ [facility] [phone]

## 2023-04-13 ENCOUNTER — Encounter: Payer: Self-pay | Admitting: *Deleted

## 2023-04-13 ENCOUNTER — Telehealth: Payer: Self-pay | Admitting: *Deleted

## 2023-04-13 DIAGNOSIS — J189 Pneumonia, unspecified organism: Secondary | ICD-10-CM | POA: Diagnosis not present

## 2023-04-13 DIAGNOSIS — L89152 Pressure ulcer of sacral region, stage 2: Secondary | ICD-10-CM | POA: Diagnosis not present

## 2023-04-13 DIAGNOSIS — S2090XD Unspecified superficial injury of unspecified parts of thorax, subsequent encounter: Secondary | ICD-10-CM | POA: Diagnosis not present

## 2023-04-13 DIAGNOSIS — I69351 Hemiplegia and hemiparesis following cerebral infarction affecting right dominant side: Secondary | ICD-10-CM | POA: Diagnosis not present

## 2023-04-13 DIAGNOSIS — I25119 Atherosclerotic heart disease of native coronary artery with unspecified angina pectoris: Secondary | ICD-10-CM | POA: Diagnosis not present

## 2023-04-13 DIAGNOSIS — J440 Chronic obstructive pulmonary disease with acute lower respiratory infection: Secondary | ICD-10-CM | POA: Diagnosis not present

## 2023-04-13 DIAGNOSIS — S80912D Unspecified superficial injury of left knee, subsequent encounter: Secondary | ICD-10-CM | POA: Diagnosis not present

## 2023-04-13 DIAGNOSIS — I959 Hypotension, unspecified: Secondary | ICD-10-CM | POA: Diagnosis not present

## 2023-04-13 DIAGNOSIS — I4891 Unspecified atrial fibrillation: Secondary | ICD-10-CM | POA: Diagnosis not present

## 2023-04-13 NOTE — Patient Instructions (Signed)
 Visit Information  Thank you for taking time to visit with me today. Please don't hesitate to contact me if I can be of assistance to you before our next scheduled telephone appointment.  Following are the goals we discussed today:   Goals Addressed             This Visit's Progress    COMPLETED: Transition of Care/ pt will have no readmissions within 30 days       Current Barriers:  Chronic Disease Management support and education needs related to Community Acquired Pneumonia  Cognitive Deficits- per daughter, pt has short term memory loss Spoke with daughter [name] who reports she and siblings care for patient, pt is able to ambulate with walker and they are trying to encourage pt to change positions q 2 hours and get up and walk, pt had open area on sacrum with no wound care ordered, no mention of drainage, daughter states she observes the area when she assists pt with bath, air overlay mattress was ordered and per daughter [employer] said patient's sacral area does not meet requirements to get this type of overlay and family is not paying out of pocket. Patient completed post hospital follow up appointment on 03/17/23, after labwork, sodium bicarbonate  discontinued, pt is taking all medications as prescribed including ferrous sulfate  [facility] PT/ OT/ RN is working with pt per daughter Patient's daughter states pt is out of nitroglycerin  and has expired prescription 88 years old, states she has tried to get a refill but has been unsuccessful Patient's daughter would like ongoing care management outreach, she feels this is beneficial with managing pt at home, requests telephone outreach mid-February  RNCM Clinical Goal(s):  Patient will work with the Care Management team over the next 30 days to address Transition of Care Barriers: Home Health services verbalize basic understanding of  Community Acquired Pneumonia disease process and self health management plan as evidenced  by patient/ CG report, review of EMR and  through collaboration with [employer], provider, and care team.   Interventions: Evaluation of current treatment plan related to  self management and patient's adherence to plan as established by provider  Community Acquired Pneumonia  (Status:  New goal. and Goal on track:  Yes.)  Short Term Goal Evaluation of current treatment plan related to  Community Acquired Pneumonia , ADL IADL limitations and Cognitive Deficits self-management and patient's adherence to plan as established by provider. Discussed plans with patient for ongoing care management follow up and provided patient with direct contact information for care management team Reinforced signs/ symptoms of infection and reportable signs/ symptoms Reviewed importance of working with home health and completing prescribed exercises Reviewed importance of changing positions q 2 hours for prevention of skin breakdown Reinforced importance of including adequate protein at each meal Reviewed all upcoming scheduled appointments RN Care Manager sent in basket message to cardiologist - Dr. Lavona is covering for Dr. Dann (no longer with heart care) and Jackee Alberts NP requesting refill for NTG be sent to [facility] on [facility]. Patient's care transferred to primary care provider office RN Care Manager, care guide scheduled appointment for 05/17/23 @ 9 am, in basket message sent to Luke Griffiths RN Care Manager with update  Patient Goals/Self-Care Activities: Participate in Transition of Care Program/Attend [facility] scheduled calls Notify RN Care Manager of [facility] call rescheduling needs Take all medications as prescribed Attend all scheduled provider appointments Call pharmacy for medication refills  3-7 days in advance of running out of medications Call provider office for new concerns or questions  Call your doctor for any signs/ symptoms of infection (fever above 100.4, overall not  feeling well, increase sputum, change in color of sputum) Continue working with [facility]  [phone], complete any prescribed exercises Change positions every 2 hours Include protein at each meal Message was sent to cardiologist to have nitroglycerin  refill called into your pharmacy - please check with pharmacy You have been transferred to care management with your primary care provider office, RN will call you on 05/17/23 @ 9 am  Follow Up Plan:  The patient has been provided with contact information for the care management team and has been advised to call with any health related questions or concerns.  Patient has been scheduled with Case Manager for outreach on 05/17/23 @ 9 am          Our next appointment is by telephone on 05/17/23 at 9 am  Please call the care guide team at [phone] if you need to cancel or reschedule your appointment.   If you are experiencing a Mental Health or Behavioral Health Crisis or need someone to talk to, please call the Suicide and Crisis Lifeline: 988 call the [place]  National Suicide Prevention Lifeline: 734-024-7944 or TTY: 785 437 1407 TTY (213) 675-0999) to talk to a trained counselor call 1-800-273-TALK (toll free, 24 hour hotline) go to [facility] [address], [place] [phone]) call the [facility] Crisis Line: [phone] call 911   Patient verbalizes understanding of instructions and care plan provided today and agrees to view in MyChart. Active MyChart status and patient understanding of how to access instructions and care plan via MyChart confirmed with patient.     Telephone follow up appointment with care management team member scheduled for:  05/17/23 @ 9 am with nurse Luke Griffiths  Please call RN Care Manager if you have any issues with getting nitroglycerin  prescription- [phone]  Mliss Creed [facility], BSN RN Care Manager/ Transition of Care [place]/ [facility] [phone]

## 2023-04-13 NOTE — Patient Outreach (Signed)
 Care Management  Transitions of Care Program Transitions of Care Post-discharge 5   04/13/2023 Name: Evan [name] MRN: [id number] DOB: 17-Jul-1927  Subjective: Evan [name] is a 89 y.o. year old male who is a primary care patient of Pickard, Butler DASEN, MD. The Care Management team Engaged with patient Engaged with patient by telephone to assess and address transitions of care needs.   Consent to Services:  Patient's daughter agrees to ongoing care management services  Assessment: Daughter [name] is primary caregiver for pt, continues working with home health, feels sacral wound  is much better, no issues reported with foley catheter, pt needs refill for nitroglycerin  and daughter has been unable to get refill, RN Care Manager requested refill from cardiologist, daughter requests ongoing care management, appointment made for outreach by RN CM on 05/17/23 @ 9 am.         SDOH Interventions    Flowsheet Row Telephone from 03/14/2023 in [place] [facility] Care Coordination from 07/25/2022 in [facility] Community Care Coordination Care Coordination from 06/30/2022 in [facility] Community Care Coordination Care Coordination from 06/03/2022 in [facility] Community Care Coordination Office Visit from 05/25/2022 in [facility] Telephone from 05/04/2022 in [facility] Community Care Coordination  SDOH Interventions        Food Insecurity Interventions Intervention Not Indicated Intervention Not Indicated Intervention Not Indicated  [Verified by Daughter, [name] [name]] Intervention Not Indicated -- --  Housing Interventions Intervention Not Indicated Intervention Not Indicated Intervention Not Indicated  [Verified by Daughter, [name] [name]] Intervention Not Indicated -- Intervention Not Indicated  Transportation Interventions Intervention Not Indicated Intervention Not Indicated Intervention Not  Indicated, Patient Resources (Friends/Family), Payor Benefit  [Verified by Daughter, [name] [name]] Intervention Not Indicated -- Intervention Not Indicated  Utilities Interventions Intervention Not Indicated Intervention Not Indicated Intervention Not Indicated  [Verified by Daughter, [name] [name]] Intervention Not Indicated -- --  Alcohol  Usage Interventions -- -- Intervention Not Indicated (Score <7)  [Verified by Daughter, [name] [name]] -- -- --  Depression Interventions/Treatment  -- -- -- -- PHQ2-9 Score <4 Follow-up Not Indicated --  Financial Strain Interventions -- Intervention Not Indicated Intervention Not Indicated  [Verified by Daughter, [name] [name]] Intervention Not Indicated -- Intervention Not Indicated  Physical Activity Interventions -- -- Intervention Not Indicated, Patient Refused  [Verified by Daughter, [name] [name]] -- -- --  Stress Interventions -- Intervention Not Indicated Intervention Not Indicated  [Verified by Daughter, [name] [name]] Intervention Not Indicated -- --  Social Connections Interventions -- -- Intervention Not Indicated  [Verified by Daughter, [name] [name]] -- -- --        Goals Addressed             This Visit's Progress    COMPLETED: Transition of Care/ pt will have no readmissions within 30 days       Current Barriers:  Chronic Disease Management support and education needs related to Community Acquired Pneumonia  Cognitive Deficits- per daughter, pt has short term memory loss Spoke with daughter [name] Dawn who reports she and siblings care for patient, pt is able to ambulate with walker and they are trying to encourage pt to change positions q 2 hours and get up and walk, pt had open area on sacrum with no wound care ordered, no mention of drainage, daughter states she observes the area when she assists pt with bath, air  overlay mattress was ordered and per daughter [employer] said patient's sacral area does not meet  requirements to get this type of overlay and family is not paying out of pocket. Patient completed post hospital follow up appointment on 03/17/23, after labwork, sodium bicarbonate  discontinued, pt is taking all medications as prescribed including ferrous sulfate  [facility] PT/ OT/ RN is working with pt per daughter Patient's daughter states pt is out of nitroglycerin  and has expired prescription 89 years old, states she has tried to get a refill but has been unsuccessful Patient's daughter would like ongoing care management outreach, she feels this is beneficial with managing pt at home, requests telephone outreach mid-February  RNCM Clinical Goal(s):  Patient will work with the Care Management team over the next 30 days to address Transition of Care Barriers: Home Health services verbalize basic understanding of  Community Acquired Pneumonia disease process and self health management plan as evidenced by patient/ CG report, review of EMR and  through collaboration with [employer], provider, and care team.   Interventions: Evaluation of current treatment plan related to  self management and patient's adherence to plan as established by provider  Community Acquired Pneumonia  (Status:  New goal. and Goal on track:  Yes.)  Short Term Goal Evaluation of current treatment plan related to  Community Acquired Pneumonia , ADL IADL limitations and Cognitive Deficits self-management and patient's adherence to plan as established by provider. Discussed plans with patient for ongoing care management follow up and provided patient with direct contact information for care management team Reinforced signs/ symptoms of infection and reportable signs/ symptoms Reviewed importance of working with home health and completing prescribed exercises Reviewed importance of changing positions q 2 hours for prevention of skin breakdown Reinforced importance of including adequate protein at each meal Reviewed  all upcoming scheduled appointments RN Care Manager sent in basket message to cardiologist - Dr. Lavona is covering for Dr. Dann (no longer with heart care) and Jackee Alberts NP requesting refill for NTG be sent to [facility] Patient's care transferred to primary care provider office RN Care Manager, care guide scheduled appointment for 05/17/23 @ 9 am, in basket message sent to Luke Griffiths RN Care Manager with update  Patient Goals/Self-Care Activities: Participate in Transition of Care Program/Attend [employer] scheduled calls Notify RN Care Manager of [facility] call rescheduling needs Take all medications as prescribed Attend all scheduled provider appointments Call pharmacy for medication refills 3-7 days in advance of running out of medications Call provider office for new concerns or questions  Call your doctor for any signs/ symptoms of infection (fever above 100.4, overall not feeling well, increase sputum, change in color of sputum) Continue working with [facility]  [phone], complete any prescribed exercises Change positions every 2 hours Include protein at each meal Message was sent to cardiologist to have nitroglycerin  refill called into your pharmacy - please check with pharmacy You have been transferred to care management with your primary care provider office, RN will call you on 05/17/23 @ 9 am  Follow Up Plan:  The patient has been provided with contact information for the care management team and has been advised to call with any health related questions or concerns.  Patient has been scheduled with Case Manager for outreach on 05/17/23 @ 9 am         Plan: The patient has been provided with contact information for the care management team and has  been advised to call with any health related questions or concerns.   RN Care Manager will outreach pt on 05/17/23  @ 9 am Please call RN Care Manager if you have any difficulty getting your  prescription for nitroglycerin - [phone]  Mliss Creed [facility], BSN RN Care Manager/ Transition of Care [place]/ [facility] [phone]

## 2023-04-14 DIAGNOSIS — I959 Hypotension, unspecified: Secondary | ICD-10-CM | POA: Diagnosis not present

## 2023-04-14 DIAGNOSIS — I25119 Atherosclerotic heart disease of native coronary artery with unspecified angina pectoris: Secondary | ICD-10-CM | POA: Diagnosis not present

## 2023-04-14 DIAGNOSIS — I4891 Unspecified atrial fibrillation: Secondary | ICD-10-CM | POA: Diagnosis not present

## 2023-04-14 DIAGNOSIS — J189 Pneumonia, unspecified organism: Secondary | ICD-10-CM | POA: Diagnosis not present

## 2023-04-14 DIAGNOSIS — J440 Chronic obstructive pulmonary disease with acute lower respiratory infection: Secondary | ICD-10-CM | POA: Diagnosis not present

## 2023-04-14 DIAGNOSIS — L89152 Pressure ulcer of sacral region, stage 2: Secondary | ICD-10-CM | POA: Diagnosis not present

## 2023-04-14 DIAGNOSIS — S80912D Unspecified superficial injury of left knee, subsequent encounter: Secondary | ICD-10-CM | POA: Diagnosis not present

## 2023-04-14 DIAGNOSIS — I69351 Hemiplegia and hemiparesis following cerebral infarction affecting right dominant side: Secondary | ICD-10-CM | POA: Diagnosis not present

## 2023-04-14 DIAGNOSIS — S2090XD Unspecified superficial injury of unspecified parts of thorax, subsequent encounter: Secondary | ICD-10-CM | POA: Diagnosis not present

## 2023-04-15 ENCOUNTER — Other Ambulatory Visit: Payer: Self-pay | Admitting: Family Medicine

## 2023-04-18 DIAGNOSIS — S2090XD Unspecified superficial injury of unspecified parts of thorax, subsequent encounter: Secondary | ICD-10-CM | POA: Diagnosis not present

## 2023-04-18 DIAGNOSIS — I25119 Atherosclerotic heart disease of native coronary artery with unspecified angina pectoris: Secondary | ICD-10-CM | POA: Diagnosis not present

## 2023-04-18 DIAGNOSIS — I4891 Unspecified atrial fibrillation: Secondary | ICD-10-CM | POA: Diagnosis not present

## 2023-04-18 DIAGNOSIS — I69351 Hemiplegia and hemiparesis following cerebral infarction affecting right dominant side: Secondary | ICD-10-CM | POA: Diagnosis not present

## 2023-04-18 DIAGNOSIS — J189 Pneumonia, unspecified organism: Secondary | ICD-10-CM | POA: Diagnosis not present

## 2023-04-18 DIAGNOSIS — S80912D Unspecified superficial injury of left knee, subsequent encounter: Secondary | ICD-10-CM | POA: Diagnosis not present

## 2023-04-18 DIAGNOSIS — J440 Chronic obstructive pulmonary disease with acute lower respiratory infection: Secondary | ICD-10-CM | POA: Diagnosis not present

## 2023-04-18 DIAGNOSIS — I959 Hypotension, unspecified: Secondary | ICD-10-CM | POA: Diagnosis not present

## 2023-04-18 DIAGNOSIS — L89152 Pressure ulcer of sacral region, stage 2: Secondary | ICD-10-CM | POA: Diagnosis not present

## 2023-04-18 NOTE — Telephone Encounter (Signed)
 Requested medications are due for refill today.  yes  Requested medications are on the active medications list.  yes  Last refill. 04/11/2022 #90 3 rf  Future visit scheduled.   no  Notes to clinic.  Missing labs.     Requested Prescriptions  Pending Prescriptions Disp Refills   finasteride  (PROSCAR ) 5 MG tablet 90 tablet 3    Sig: Take 1 tablet (5 mg total) by mouth daily.     Urology: 5-alpha Reductase Inhibitors Failed - 04/18/2023  8:50 AM      Failed - PSA in normal range and within 360 days    PSA  Date Value Ref Range Status  06/24/2014 1.77 <=4.00 ng/mL Final    Comment:    Test Methodology: ECLIA PSA (Electrochemiluminescence Immunoassay)   For PSA values from 2.5-4.0, particularly in younger men <38 years old, the AUA and NCCN suggest testing for % Free PSA (3515) and evaluation of the rate of increase in PSA (PSA velocity).          Failed - Valid encounter within last 12 months    Recent Outpatient Visits           1 year ago Acute pain of right shoulder   [facility] Pickard, Butler DASEN, MD   1 year ago Dysuria   [facility] [name], MD   1 year ago Acute systolic congestive heart failure [facility])   [facility] [name], MD   1 year ago Acute cough   [facility] Chandra Harlene LABOR, NP   2 years ago Need for immunization against influenza   [facility] Pickard, Butler DASEN, MD       Future Appointments             In 6 days Okey Vina GAILS, MD [facility]

## 2023-04-19 DIAGNOSIS — S80912D Unspecified superficial injury of left knee, subsequent encounter: Secondary | ICD-10-CM | POA: Diagnosis not present

## 2023-04-19 DIAGNOSIS — S2090XD Unspecified superficial injury of unspecified parts of thorax, subsequent encounter: Secondary | ICD-10-CM | POA: Diagnosis not present

## 2023-04-19 DIAGNOSIS — J440 Chronic obstructive pulmonary disease with acute lower respiratory infection: Secondary | ICD-10-CM | POA: Diagnosis not present

## 2023-04-19 DIAGNOSIS — I959 Hypotension, unspecified: Secondary | ICD-10-CM | POA: Diagnosis not present

## 2023-04-19 DIAGNOSIS — I25119 Atherosclerotic heart disease of native coronary artery with unspecified angina pectoris: Secondary | ICD-10-CM | POA: Diagnosis not present

## 2023-04-19 DIAGNOSIS — J189 Pneumonia, unspecified organism: Secondary | ICD-10-CM | POA: Diagnosis not present

## 2023-04-19 DIAGNOSIS — L89152 Pressure ulcer of sacral region, stage 2: Secondary | ICD-10-CM | POA: Diagnosis not present

## 2023-04-19 DIAGNOSIS — I69351 Hemiplegia and hemiparesis following cerebral infarction affecting right dominant side: Secondary | ICD-10-CM | POA: Diagnosis not present

## 2023-04-19 DIAGNOSIS — I4891 Unspecified atrial fibrillation: Secondary | ICD-10-CM | POA: Diagnosis not present

## 2023-04-20 ENCOUNTER — Other Ambulatory Visit: Payer: Self-pay | Admitting: Family Medicine

## 2023-04-20 ENCOUNTER — Ambulatory Visit: Payer: Medicare HMO | Admitting: *Deleted

## 2023-04-20 DIAGNOSIS — Z0000 Encounter for general adult medical examination without abnormal findings: Secondary | ICD-10-CM

## 2023-04-20 NOTE — Progress Notes (Signed)
Subjective:   Evan [name] is a 89 y.o. male who presents for Medicare Annual/Subsequent preventive examination.  Visit Complete: Virtual I connected with  Evan [name] on 04/20/23 by a audio enabled telemedicine application and verified that I am speaking with the correct person using two identifiers.  Daughter Evan [name] Daughter did a telephone visit patient was present.   Patient Location: Home  Provider Location: Home Office  I discussed the limitations of evaluation and management by telemedicine. The patient expressed understanding and agreed to proceed.  Vital Signs: Because this visit was a virtual/telehealth visit, some criteria may be missing or patient reported. Any vitals not documented were not able to be obtained and vitals that have been documented are patient reported.        Objective:    There were no vitals filed for this visit. There is no height or weight on file to calculate BMI.     04/20/2023   10:34 AM 03/10/2023    4:27 PM 03/08/2023   10:22 AM 11/05/2022   11:45 AM 09/29/2022   12:23 AM 09/27/2022    5:31 PM 07/23/2022   12:49 PM  Advanced Directives  Does Patient Have a Medical Advance Directive? Yes Yes Yes Yes  No No  Type of Advance Directive Healthcare Power of Attorney Living will  Living will     Does patient want to make changes to medical advance directive?  No - Patient declined     No - Guardian declined  Copy of Healthcare Power of Attorney in Chart? No - copy requested        Would patient like information on creating a medical advance directive?     No - Guardian declined  No - Guardian declined    Current Medications (verified) Outpatient Encounter Medications as of 04/20/2023  Medication Sig   acetaminophen (TYLENOL) 500 MG tablet Take 500 mg by mouth 2 (two) times daily as needed for mild pain, moderate pain, fever or headache.   apixaban (ELIQUIS) 2.5 MG TABS tablet Take 1 tablet (2.5 mg total) by mouth 2 (two) times daily.    budesonide-formoterol (SYMBICORT) 160-4.5 MCG/ACT inhaler Inhale 2 puffs into the lungs in the morning and at bedtime.   ciprofloxacin (CILOXAN) 0.3 % ophthalmic solution Place 2 drops into both eyes every 4 (four) hours while awake. Administer 1 drop, every 2 hours, while awake, for 2 days. Then 1 drop, every 4 hours, while awake, for the next 5 days.   finasteride (PROSCAR) 5 MG tablet Take 1 tablet (5 mg total) by mouth daily.   FLUoxetine (PROZAC) 20 MG capsule TAKE 1 CAPSULE EVERY DAY   folic acid (FOLVITE) 1 MG tablet Take 1 tablet (1 mg total) by mouth daily.   furosemide (LASIX) 40 MG tablet Take 1 tablet (40 mg total) by mouth daily. (Patient taking differently: Take 40 mg by mouth at bedtime.)   Iron, Ferrous Sulfate, 325 (65 Fe) MG TABS Take 325 mg by mouth daily.   metoprolol tartrate (LOPRESSOR) 25 MG tablet Take 0.5 tablets (12.5 mg total) by mouth See admin instructions. Take 12.5 mg (1/2 tablet) daily. May take an additional 1/2 tablet daily if needed for chest pain or elevated blood pressure.   Multiple Vitamins-Minerals (MENS 50+ MULTIVITAMIN) TABS Take 1 tablet by mouth daily.   pantoprazole (PROTONIX) 40 MG tablet TAKE 1 TABLET EVERY MORNING   rosuvastatin (CRESTOR) 5 MG tablet TAKE 1 TABLET EVERY DAY (Patient taking differently: Take 5 mg by  mouth at bedtime.)   senna-docusate (STIMULANT LAXATIVE) 8.6-50 MG tablet Take 2 tablets by mouth 2 (two) times daily as needed for constipation   tamsulosin (FLOMAX) 0.4 MG CAPS capsule Take 1 capsule (0.4 mg total) by mouth daily with supper. (Patient taking differently: Take 0.4 mg by mouth at bedtime.)   Zinc Oxide (TRIPLE PASTE) 12.8 % ointment Apply topically 4 (four) times daily as needed (skin barrier). (Patient taking differently: Apply 1 Application topically 2 (two) times daily as needed (skin barrier).)   No facility-administered encounter medications on file as of 04/20/2023.    Allergies (verified) Feldene [piroxicam],  Neomycin-polymyxin-hc, Statins, Latex, Tequin [gatifloxacin], Valium [diazepam], and Vibramycin [doxycycline]   History: 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   Altered mental status 08/21/2018   Anginal chest pain at rest [facility])    Chronic non, controlled on Lorazepam   Anxiety    Arthritis    "all over"    BPH (benign prostatic hyperplasia)    CAD (coronary artery disease)    Cellulitis of arm, right 06/01/2018   Cellulitis of left axilla    Chronic lower back pain    Complication of anesthesia    "problems making water afterwards"   Depression    DJD (degenerative joint disease)    Esophageal ulcer without bleeding    bid ppi indefinitely   Fall 07/16/2018   Family history of adverse reaction to anesthesia    "children w/PONV"   GERD (gastroesophageal reflux disease)    Headache    "couple/week maybe" (09/04/2015)   Hemochromatosis    Possible, elevated iron stores, hook-like osteophytes on hand films, normal LFTs   Hepatitis    "yellow jaundice as a baby"   History of ASCVD    MULTIVESSEL   Hyperlipidemia    Hypertension    Hypotension    Mild aortic stenosis 06/23/2021   Osteoarthritis    Pneumonia    "several times; got a little now" (09/04/2015)   Pulmonary embolism (HCC) 02/02/2018   Rash and nonspecific skin eruption 06/01/2018   Rheumatoid arthritis (HCC)    RSV (respiratory syncytial virus pneumonia) 05/15/2022   Subdural hematoma (HCC)    small after fall 09/2016-plavix held, neurosurgery consulted.  Asymptomatic.     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 he failed PO Eliquis   Past Surgical History:  Procedure Laterality Date   ARM SKIN LESION BIOPSY / EXCISION Left 09/02/2015   CATARACT EXTRACTION W/ INTRAOCULAR  LENS  IMPLANT, BILATERAL Bilateral    CHOLECYSTECTOMY OPEN     CORNEAL TRANSPLANT Bilateral    "one at [facility]; one at [facility]"   CORONARY ANGIOPLASTY WITH STENT PLACEMENT     CORONARY ARTERY BYPASS GRAFT  1999   DESCENDING AORTIC ANEURYSM REPAIR W/ STENT     DE stent ostium into the right radial free graft at OM-, 02-2007   ESOPHAGOGASTRODUODENOSCOPY N/A 11/09/2014   Procedure: ESOPHAGOGASTRODUODENOSCOPY (EGD);  Surgeon: Dorena Cookey, MD;  Location: [facility] ENDOSCOPY;  Service: Endoscopy;  Laterality: N/A;   INGUINAL HERNIA REPAIR     JOINT REPLACEMENT     LEFT HEART CATH AND CORS/GRAFTS ANGIOGRAPHY N/A 06/27/2017   Procedure: LEFT HEART CATH AND CORS/GRAFTS ANGIOGRAPHY;  Surgeon: Lennette Bihari, MD;  Location: [facility] INVASIVE CV LAB;  Service: Cardiovascular;  Laterality: N/A;  NASAL SINUS SURGERY     SHOULDER OPEN ROTATOR CUFF REPAIR Right    TOTAL KNEE ARTHROPLASTY Bilateral    Family History  Problem Relation Age of Onset   Arthritis-Osteo Sister    Heart attack Brother    Heart attack Other    Cancer Neg Hx    Social History   Socioeconomic History   Marital status: Widowed    Spouse name: Not on file   Number of children: 2   Years of education: 46   Highest education level: 12th grade  Occupational History   Not on file  Tobacco Use   Smoking status: Former    Types: Cigarettes    Passive exposure: Yes   Smokeless tobacco: Former    Types: Chew   Tobacco comments:    "quit chewing in the 1960s"    Verified by Daughter, [name]  Vaping Use   Vaping status: Never Used  Substance and Sexual Activity   Alcohol use: No   Drug use: No   Sexual activity: Never  Other Topics Concern   Not on file  Social History Narrative   Not on file   Social Drivers of Health   Financial Resource Strain: Low Risk  (04/20/2023)   Overall Financial Resource Strain (CARDIA)    Difficulty of Paying Living Expenses: Not hard at all  Food Insecurity: No Food Insecurity (04/20/2023)    Hunger Vital Sign    Worried About Running Out of Food in the Last Year: Never true    Ran Out of Food in the Last Year: Never true  Transportation Needs: No Transportation Needs (04/20/2023)   PRAPARE - Administrator, Civil Service (Medical): No    Lack of Transportation (Non-Medical): No  Physical Activity: Insufficiently Active (04/20/2023)   Exercise Vital Sign    Days of Exercise per Week: 5 days    Minutes of Exercise per Session: 10 min  Stress: No Stress Concern Present (04/20/2023)   [employer] of Occupational Health - Occupational Stress Questionnaire    Feeling of Stress : Not at all  Social Connections: Socially Isolated (04/20/2023)   Social Connection and Isolation Panel [NHANES]    Frequency of Communication with Friends and Family: Three times a week    Frequency of Social Gatherings with Friends and Family: Twice a week    Attends Religious Services: Never    Database administrator or Organizations: No    Attends Banker Meetings: Never    Marital Status: Widowed    Tobacco Counseling Counseling given: Not Answered Tobacco comments: "quit chewing in the 1960s" Verified by Daughter, [name]   Clinical Intake:  Pre-visit preparation completed: Yes  Pain : No/denies pain     Diabetes: No  How often do you need to have someone help you when you read instructions, pamphlets, or other written materials from your doctor or pharmacy?: 5 - Always  Interpreter Needed?: No  Information entered by :: Katricia Prehn lpn   Activities of Daily Living    04/20/2023   10:36 AM 03/08/2023   11:00 PM  In your present state of health, do you have any difficulty performing the following activities:  Hearing? 1 1  Vision? 0 0  Difficulty concentrating or making decisions? 1 1  Walking or climbing stairs? 1   Dressing or bathing? 1   Doing errands, shopping? 1 0  Preparing Food and eating ? Y   Using the Toilet? Jeannie Fend  In the past six  months, have you accidently leaked urine? Y   Do you have problems with loss of bowel control? Y   Managing your Medications? Y   Managing your Finances? Y   Housekeeping or managing your Housekeeping? Y     Patient Care Team: Donita Brooks, MD as PCP - General (Family Medicine) Corky Crafts, MD as PCP - Cardiology (Cardiology) Erroll Luna, [facility] (Inactive) as Pharmacist (Pharmacist) Clinton Gallant, RN as [facility] Care Management Ihor Austin, NP as Nurse Practitioner (Neurology) Louann Sjogren, DPM as Consulting Physician (Podiatry)  Indicate any recent Medical Services you may have received from other than [facility] providers in the past year (date may be approximate).     Assessment:   This is a routine wellness examination for Erickson.  Hearing/Vision screen Hearing Screening - Comments:: Bilateral hearing aids Vision Screening - Comments:: Up to date [facility]   Goals Addressed   None    Depression Screen    04/20/2023   10:42 AM 01/24/2023    8:51 AM 12/23/2022   11:17 AM 09/22/2022   11:07 AM 07/25/2022    6:23 PM 06/30/2022   12:29 PM 06/03/2022   11:57 AM  PHQ 2/9 Scores  PHQ - 2 Score 2 0 0 3 0 0 1  PHQ- 9 Score 6   11     Exception Documentation      Other- indicate reason in comment box   Not completed      Memory Deficits     Fall Risk    04/20/2023   10:35 AM 03/17/2023    4:05 PM 01/24/2023    8:51 AM 12/23/2022   11:17 AM 09/22/2022   11:07 AM  Fall Risk   Falls in the past year? 0 0 0 1 1  Number falls in past yr: 0 0 0 0 1  Injury with Fall? 0 0 0 1 0  Risk for fall due to : Impaired mobility;Impaired balance/gait  No Fall Risks History of fall(s);Impaired balance/gait;Impaired mobility;Impaired vision History of fall(s);Impaired balance/gait;Impaired mobility;Impaired vision  Follow up Falls evaluation completed;Education provided;Falls prevention discussed  Falls evaluation completed Education provided;Falls  prevention discussed;Falls evaluation completed Education provided;Falls prevention discussed;Falls evaluation completed    MEDICARE RISK AT HOME: Medicare Risk at Home Any stairs in or around the home?: Yes If so, are there any without handrails?: No Home free of loose throw rugs in walkways, pet beds, electrical cords, etc?: Yes Adequate lighting in your home to reduce risk of falls?: Yes Life alert?: No Use of a cane, walker or w/c?: Yes Grab bars in the bathroom?: Yes Shower chair or bench in shower?: Yes Elevated toilet seat or a handicapped toilet?: Yes  TIMED UP AND GO:  Was the test performed?  No    Cognitive Function:        04/20/2023   10:39 AM 05/07/2021   12:04 PM  6CIT Screen  What Year? 4 points 4 points  What month? 0 points 0 points  What time? 3 points 0 points  Count back from 20 2 points 0 points  Months in reverse 4 points 4 points  Repeat phrase 10 points 6 points  Total Score 23 points 14 points    Immunizations Immunization History  Administered Date(s) Administered   Fluad Quad(high Dose 65+) 01/04/2019, 01/23/2020, 04/08/2021   Influenza Whole 01/26/2007, 01/10/2008, 12/30/2009   Influenza, High Dose Seasonal PF 12/23/2016, 02/09/2018   Influenza,inj,Quad PF,6+  Mos 12/18/2012, 01/16/2015, 12/29/2015   PFIZER(Purple Top)SARS-COV-2 Vaccination 05/10/2019, 06/04/2019, 02/05/2020   Pneumococcal Conjugate-13 06/24/2014   Pneumococcal Polysaccharide-23 01/25/2000   Td 12/27/2000, 03/09/2010   Tdap 03/09/2010    TDAP status: Due, Education has been provided regarding the importance of this vaccine. Advised may receive this vaccine at local pharmacy or Health Dept. Aware to provide a copy of the vaccination record if obtained from local pharmacy or Health Dept. Verbalized acceptance and understanding.  Flu Vaccine status: Due, Education has been provided regarding the importance of this vaccine. Advised may receive this vaccine at local pharmacy or  Health Dept. Aware to provide a copy of the vaccination record if obtained from local pharmacy or Health Dept. Verbalized acceptance and understanding.  Pneumococcal vaccine status: Up to date  Covid-19 vaccine status: Information provided on how to obtain vaccines.   Qualifies for Shingles Vaccine? No   Zostavax completed No   Shingrix Completed?: No.    Education has been provided regarding the importance of this vaccine. Patient has been advised to call insurance company to determine out of pocket expense if they have not yet received this vaccine. Advised may also receive vaccine at local pharmacy or Health Dept. Verbalized acceptance and understanding.  Screening Tests Health Maintenance  Topic Date Due   COVID-19 Vaccine (4 - 2024-25 season) 05/09/2023 (Originally 12/04/2022)   Zoster Vaccines- Shingrix (1 of 2) 05/09/2023 (Originally 08/30/1946)   INFLUENZA VACCINE  07/03/2023 (Originally 11/03/2022)   Medicare Annual Wellness (AWV)  04/19/2024   Pneumonia Vaccine 34+ Years old  Completed   HPV VACCINES  Aged Out   DTaP/Tdap/Td  Discontinued    Health Maintenance  There are no preventive care reminders to display for this patient.   Colorectal cancer screening: No longer required.   Lung Cancer Screening: (Low Dose CT Chest recommended if Age 59-80 years, 20 pack-year currently smoking OR have quit w/in 15years.) does not qualify.   Lung Cancer Screening Referral:   Additional Screening:  Hepatitis C Screening: does not qualify;  Vision Screening: Recommended annual ophthalmology exams for early detection of glaucoma and other disorders of the eye. Is the patient up to date with their annual eye exam?  Yes  Who is the provider or what is the name of the office in which the patient attends annual eye exams? nice If pt is not established with a provider, would they like to be referred to a provider to establish care? No .   Dental Screening: Recommended annual dental exams  for proper oral hygiene    Community Resource Referral / Chronic Care Management: CRR required this visit?  No   CCM required this visit?  No     Plan:     I have personally reviewed and noted the following in the patient's chart:   Medical and social history Use of alcohol, tobacco or illicit drugs  Current medications and supplements including opioid prescriptions. Patient is not currently taking opioid prescriptions. Functional ability and status Nutritional status Physical activity Advanced directives List of other physicians Hospitalizations, surgeries, and ER visits in previous 12 months Vitals Screenings to include cognitive, depression, and falls Referrals and appointments  In addition, I have reviewed and discussed with patient certain preventive protocols, quality metrics, and best practice recommendations. A written personalized care plan for preventive services as well as general preventive health recommendations were provided to patient.     Remi Haggard, LPN   [id number]   After Visit Summary: (MyChart) Due to this  being a telephonic visit, the after visit summary with patients personalized plan was offered to patient via MyChart   Nurse Notes:

## 2023-04-20 NOTE — Telephone Encounter (Signed)
Requested Prescriptions  Pending Prescriptions Disp Refills   pantoprazole (PROTONIX) 40 MG tablet [Pharmacy Med Name: Pantoprazole Sodium Oral Tablet Delayed Release 40 MG] 90 tablet 0    Sig: TAKE 1 TABLET EVERY MORNING     Gastroenterology: Proton Pump Inhibitors Failed - 04/20/2023  9:09 AM      Failed - Valid encounter within last 12 months    Recent Outpatient Visits           1 year ago Acute pain of right shoulder   [facility] Pickard, Priscille Heidelberg, MD   1 year ago Dysuria   [facility] Donita Brooks, MD   1 year ago Acute systolic congestive heart failure [facility])   [facility] Donita Brooks, MD   1 year ago Acute cough   [facility] Valentino Nose, NP   2 years ago Need for immunization against influenza   [facility] Pickard, Priscille Heidelberg, MD       Future Appointments             In 4 days Pricilla Riffle, MD [facility]

## 2023-04-20 NOTE — Patient Instructions (Signed)
Evan [name] , Thank you for taking time to come for your Medicare Wellness Visit. I appreciate your ongoing commitment to your health goals. Please review the following plan we discussed and let me know if I can assist you in the future.   Screening recommendations/referrals: Colonoscopy: no longer required Recommended yearly ophthalmology/optometry visit for glaucoma screening and checkup Recommended yearly dental visit for hygiene and checkup  Vaccinations: Influenza vaccine: Education provided Pneumococcal vaccine: up to date Tdap vaccine: Education provided Shingles vaccine: Education provided     Preventive Care 65 Years and Older, Male Preventive care refers to lifestyle choices and visits with your health care provider that can promote health and wellness. What does preventive care include? A yearly physical exam. This is also called an annual well check. Dental exams once or twice a year. Routine eye exams. Ask your health care provider how often you should have your eyes checked. Personal lifestyle choices, including: Daily care of your teeth and gums. Regular physical activity. Eating a healthy diet. Avoiding tobacco and drug use. Limiting alcohol use. Practicing safe sex. Taking low doses of aspirin every day. Taking vitamin and mineral supplements as recommended by your health care provider. What happens during an annual well check? The services and screenings done by your health care provider during your annual well check will depend on your age, overall health, lifestyle risk factors, and family history of disease. Counseling  Your health care provider may ask you questions about your: Alcohol use. Tobacco use. Drug use. Emotional well-being. Home and relationship well-being. Sexual activity. Eating habits. History of falls. Memory and ability to understand (cognition). Work and work Astronomer. Screening  You may have the following tests or  measurements: Height, weight, and BMI. Blood pressure. Lipid and cholesterol levels. These may be checked every 5 years, or more frequently if you are over 48 years old. Skin check. Lung cancer screening. You may have this screening every year starting at age 68 if you have a 30-pack-year history of smoking and currently smoke or have quit within the past 15 years. Fecal occult blood test (FOBT) of the stool. You may have this test every year starting at age 42. Flexible sigmoidoscopy or colonoscopy. You may have a sigmoidoscopy every 5 years or a colonoscopy every 10 years starting at age 70. Prostate cancer screening. Recommendations will vary depending on your family history and other risks. Hepatitis C blood test. Hepatitis B blood test. Sexually transmitted disease (STD) testing. Diabetes screening. This is done by checking your blood sugar (glucose) after you have not eaten for a while (fasting). You may have this done every 1-3 years. Abdominal aortic aneurysm (AAA) screening. You may need this if you are a current or former smoker. Osteoporosis. You may be screened starting at age 17 if you are at high risk. Talk with your health care provider about your test results, treatment options, and if necessary, the need for more tests. Vaccines  Your health care provider may recommend certain vaccines, such as: Influenza vaccine. This is recommended every year. Tetanus, diphtheria, and acellular pertussis (Tdap, Td) vaccine. You may need a Td booster every 10 years. Zoster vaccine. You may need this after age 25. Pneumococcal 13-valent conjugate (PCV13) vaccine. One dose is recommended after age 52. Pneumococcal polysaccharide (PPSV23) vaccine. One dose is recommended after age 40. Talk to your health care provider about which screenings and vaccines you need and how often you need them. This information is not intended to replace advice given  to you by your health care provider. Make sure  you discuss any questions you have with your health care provider. Document Released: 04/17/2015 Document Revised: 12/09/2015 Document Reviewed: 01/20/2015 Elsevier Interactive Patient Education  2017 [employer].  Fall Prevention in the Home Falls can cause injuries. They can happen to people of all ages. There are many things you can do to make your home safe and to help prevent falls. What can I do on the outside of my home? Regularly fix the edges of walkways and driveways and fix any cracks. Remove anything that might make you trip as you walk through a door, such as a raised step or threshold. Trim any bushes or trees on the path to your home. Use bright outdoor lighting. Clear any walking paths of anything that might make someone trip, such as rocks or tools. Regularly check to see if handrails are loose or broken. Make sure that both sides of any steps have handrails. Any raised decks and porches should have guardrails on the edges. Have any leaves, snow, or ice cleared regularly. Use sand or salt on walking paths during winter. Clean up any spills in your garage right away. This includes oil or grease spills. What can I do in the bathroom? Use night lights. Install grab bars by the toilet and in the tub and shower. Do not use towel bars as grab bars. Use non-skid mats or decals in the tub or shower. If you need to sit down in the shower, use a plastic, non-slip stool. Keep the floor dry. Clean up any water that spills on the floor as soon as it happens. Remove soap buildup in the tub or shower regularly. Attach bath mats securely with double-sided non-slip rug tape. Do not have throw rugs and other things on the floor that can make you trip. What can I do in the bedroom? Use night lights. Make sure that you have a light by your bed that is easy to reach. Do not use any sheets or blankets that are too big for your bed. They should not hang down onto the floor. Have a firm  chair that has side arms. You can use this for support while you get dressed. Do not have throw rugs and other things on the floor that can make you trip. What can I do in the kitchen? Clean up any spills right away. Avoid walking on wet floors. Keep items that you use a lot in easy-to-reach places. If you need to reach something above you, use a strong step stool that has a grab bar. Keep electrical cords out of the way. Do not use floor polish or wax that makes floors slippery. If you must use wax, use non-skid floor wax. Do not have throw rugs and other things on the floor that can make you trip. What can I do with my stairs? Do not leave any items on the stairs. Make sure that there are handrails on both sides of the stairs and use them. Fix handrails that are broken or loose. Make sure that handrails are as long as the stairways. Check any carpeting to make sure that it is firmly attached to the stairs. Fix any carpet that is loose or worn. Avoid having throw rugs at the top or bottom of the stairs. If you do have throw rugs, attach them to the floor with carpet tape. Make sure that you have a light switch at the top of the stairs and the bottom of the  stairs. If you do not have them, ask someone to add them for you. What else can I do to help prevent falls? Wear shoes that: Do not have high heels. Have rubber bottoms. Are comfortable and fit you well. Are closed at the toe. Do not wear sandals. If you use a stepladder: Make sure that it is fully opened. Do not climb a closed stepladder. Make sure that both sides of the stepladder are locked into place. Ask someone to hold it for you, if possible. Clearly mark and make sure that you can see: Any grab bars or handrails. First and last steps. Where the edge of each step is. Use tools that help you move around (mobility aids) if they are needed. These include: Canes. Walkers. Scooters. Crutches. Turn on the lights when you go  into a dark area. Replace any light bulbs as soon as they burn out. Set up your furniture so you have a clear path. Avoid moving your furniture around. If any of your floors are uneven, fix them. If there are any pets around you, be aware of where they are. Review your medicines with your doctor. Some medicines can make you feel dizzy. This can increase your chance of falling. Ask your doctor what other things that you can do to help prevent falls. This information is not intended to replace advice given to you by your health care provider. Make sure you discuss any questions you have with your health care provider. Document Released: 01/15/2009 Document Revised: 08/27/2015 Document Reviewed: 04/25/2014 Elsevier Interactive Patient Education  2017 [employer].

## 2023-04-23 NOTE — Progress Notes (Deleted)
 Pt rescheduled

## 2023-04-24 ENCOUNTER — Ambulatory Visit: Payer: Medicare HMO | Admitting: Internal Medicine

## 2023-04-24 ENCOUNTER — Other Ambulatory Visit (HOSPITAL_COMMUNITY): Payer: Self-pay

## 2023-04-24 DIAGNOSIS — J189 Pneumonia, unspecified organism: Secondary | ICD-10-CM | POA: Diagnosis not present

## 2023-04-24 DIAGNOSIS — S2090XD Unspecified superficial injury of unspecified parts of thorax, subsequent encounter: Secondary | ICD-10-CM | POA: Diagnosis not present

## 2023-04-24 DIAGNOSIS — I69351 Hemiplegia and hemiparesis following cerebral infarction affecting right dominant side: Secondary | ICD-10-CM | POA: Diagnosis not present

## 2023-04-24 DIAGNOSIS — I4891 Unspecified atrial fibrillation: Secondary | ICD-10-CM | POA: Diagnosis not present

## 2023-04-24 DIAGNOSIS — L89152 Pressure ulcer of sacral region, stage 2: Secondary | ICD-10-CM | POA: Diagnosis not present

## 2023-04-24 DIAGNOSIS — S80912D Unspecified superficial injury of left knee, subsequent encounter: Secondary | ICD-10-CM | POA: Diagnosis not present

## 2023-04-24 DIAGNOSIS — J440 Chronic obstructive pulmonary disease with acute lower respiratory infection: Secondary | ICD-10-CM | POA: Diagnosis not present

## 2023-04-24 DIAGNOSIS — I959 Hypotension, unspecified: Secondary | ICD-10-CM | POA: Diagnosis not present

## 2023-04-24 DIAGNOSIS — I25119 Atherosclerotic heart disease of native coronary artery with unspecified angina pectoris: Secondary | ICD-10-CM | POA: Diagnosis not present

## 2023-04-25 ENCOUNTER — Other Ambulatory Visit (HOSPITAL_COMMUNITY): Payer: Self-pay

## 2023-04-26 ENCOUNTER — Other Ambulatory Visit: Payer: Self-pay | Admitting: Family Medicine

## 2023-04-26 DIAGNOSIS — S2090XD Unspecified superficial injury of unspecified parts of thorax, subsequent encounter: Secondary | ICD-10-CM | POA: Diagnosis not present

## 2023-04-26 DIAGNOSIS — I959 Hypotension, unspecified: Secondary | ICD-10-CM | POA: Diagnosis not present

## 2023-04-26 DIAGNOSIS — I69351 Hemiplegia and hemiparesis following cerebral infarction affecting right dominant side: Secondary | ICD-10-CM | POA: Diagnosis not present

## 2023-04-26 DIAGNOSIS — J440 Chronic obstructive pulmonary disease with acute lower respiratory infection: Secondary | ICD-10-CM | POA: Diagnosis not present

## 2023-04-26 DIAGNOSIS — I25119 Atherosclerotic heart disease of native coronary artery with unspecified angina pectoris: Secondary | ICD-10-CM | POA: Diagnosis not present

## 2023-04-26 DIAGNOSIS — S80912D Unspecified superficial injury of left knee, subsequent encounter: Secondary | ICD-10-CM | POA: Diagnosis not present

## 2023-04-26 DIAGNOSIS — I4891 Unspecified atrial fibrillation: Secondary | ICD-10-CM | POA: Diagnosis not present

## 2023-04-26 DIAGNOSIS — J189 Pneumonia, unspecified organism: Secondary | ICD-10-CM | POA: Diagnosis not present

## 2023-04-26 DIAGNOSIS — L89152 Pressure ulcer of sacral region, stage 2: Secondary | ICD-10-CM | POA: Diagnosis not present

## 2023-04-26 DIAGNOSIS — I5021 Acute systolic (congestive) heart failure: Secondary | ICD-10-CM

## 2023-04-26 NOTE — Telephone Encounter (Signed)
Requested Prescriptions  Pending Prescriptions Disp Refills   furosemide (LASIX) 40 MG tablet [Pharmacy Med Name: Furosemide Oral Tablet 40 MG] 90 tablet 0    Sig: TAKE 1 TABLET EVERY DAY     Cardiovascular:  Diuretics - Loop Failed - 04/26/2023  9:59 AM      Failed - Valid encounter within last 6 months    Recent Outpatient Visits           1 year ago Acute pain of right shoulder   [facility] Pickard, Priscille Heidelberg, MD   1 year ago Dysuria   [facility] Donita Brooks, MD   1 year ago Acute systolic congestive heart failure (HCC)   [facility] Donita Brooks, MD   1 year ago Acute cough   [facility] Valentino Nose, NP   2 years ago Need for immunization against influenza   [facility] Pickard, Priscille Heidelberg, MD       Future Appointments             In 2 months Pricilla Riffle, MD [facility], [facility]            Passed - K in normal range and within 180 days    Potassium  Date Value Ref Range Status  03/17/2023 3.6 3.5 - 5.3 mmol/L Final  08/16/2016 4.3 3.5 - 5.1 mEq/L Final         Passed - Ca in normal range and within 180 days    Calcium  Date Value Ref Range Status  03/17/2023 8.8 8.6 - 10.3 mg/dL Final  84/69/6295 9.0 8.4 - 10.4 mg/dL Final   Calcium, Ion  Date Value Ref Range Status  03/08/2023 1.05 (L) 1.15 - 1.40 mmol/L Final         Passed - Na in normal range and within 180 days    Sodium  Date Value Ref Range Status  03/17/2023 137 135 - 146 mmol/L Final  10/14/2022 136 134 - 144 mmol/L Final  08/16/2016 143 136 - 145 mEq/L Final         Passed - Cr in normal range and within 180 days    Creatinine  Date Value Ref Range Status  08/16/2016 1.2 0.7 - 1.3 mg/dL Final   Creat  Date Value Ref Range Status  03/17/2023 1.08 0.70 - 1.22 mg/dL Final         Passed - Cl in normal range and within 180 days    Chloride  Date Value  Ref Range Status  03/17/2023 104 98 - 110 mmol/L Final  08/16/2016 110 (H) 98 - 109 mEq/L Final         Passed - Mg Level in normal range and within 180 days    Magnesium  Date Value Ref Range Status  03/13/2023 1.9 1.7 - 2.4 mg/dL Final    Comment:    Performed at [facility], [name], [place], [place] [address]         Passed - Last BP in normal range    BP Readings from Last 1 Encounters:  03/17/23 126/76

## 2023-04-28 DIAGNOSIS — N489 Disorder of penis, unspecified: Secondary | ICD-10-CM | POA: Diagnosis not present

## 2023-04-28 DIAGNOSIS — R3914 Feeling of incomplete bladder emptying: Secondary | ICD-10-CM | POA: Diagnosis not present

## 2023-05-04 DIAGNOSIS — L89152 Pressure ulcer of sacral region, stage 2: Secondary | ICD-10-CM | POA: Diagnosis not present

## 2023-05-04 DIAGNOSIS — J189 Pneumonia, unspecified organism: Secondary | ICD-10-CM | POA: Diagnosis not present

## 2023-05-04 DIAGNOSIS — I4891 Unspecified atrial fibrillation: Secondary | ICD-10-CM | POA: Diagnosis not present

## 2023-05-04 DIAGNOSIS — I69351 Hemiplegia and hemiparesis following cerebral infarction affecting right dominant side: Secondary | ICD-10-CM | POA: Diagnosis not present

## 2023-05-04 DIAGNOSIS — S2090XD Unspecified superficial injury of unspecified parts of thorax, subsequent encounter: Secondary | ICD-10-CM | POA: Diagnosis not present

## 2023-05-04 DIAGNOSIS — J440 Chronic obstructive pulmonary disease with acute lower respiratory infection: Secondary | ICD-10-CM | POA: Diagnosis not present

## 2023-05-04 DIAGNOSIS — S80912D Unspecified superficial injury of left knee, subsequent encounter: Secondary | ICD-10-CM | POA: Diagnosis not present

## 2023-05-04 DIAGNOSIS — I25119 Atherosclerotic heart disease of native coronary artery with unspecified angina pectoris: Secondary | ICD-10-CM | POA: Diagnosis not present

## 2023-05-04 DIAGNOSIS — I959 Hypotension, unspecified: Secondary | ICD-10-CM | POA: Diagnosis not present

## 2023-05-08 ENCOUNTER — Ambulatory Visit: Payer: Medicare HMO

## 2023-05-09 ENCOUNTER — Ambulatory Visit: Payer: Medicare HMO

## 2023-05-10 DIAGNOSIS — J189 Pneumonia, unspecified organism: Secondary | ICD-10-CM | POA: Diagnosis not present

## 2023-05-10 DIAGNOSIS — S80912D Unspecified superficial injury of left knee, subsequent encounter: Secondary | ICD-10-CM | POA: Diagnosis not present

## 2023-05-10 DIAGNOSIS — S2090XD Unspecified superficial injury of unspecified parts of thorax, subsequent encounter: Secondary | ICD-10-CM | POA: Diagnosis not present

## 2023-05-10 DIAGNOSIS — I25119 Atherosclerotic heart disease of native coronary artery with unspecified angina pectoris: Secondary | ICD-10-CM | POA: Diagnosis not present

## 2023-05-10 DIAGNOSIS — L89152 Pressure ulcer of sacral region, stage 2: Secondary | ICD-10-CM | POA: Diagnosis not present

## 2023-05-10 DIAGNOSIS — I69351 Hemiplegia and hemiparesis following cerebral infarction affecting right dominant side: Secondary | ICD-10-CM | POA: Diagnosis not present

## 2023-05-10 DIAGNOSIS — I959 Hypotension, unspecified: Secondary | ICD-10-CM | POA: Diagnosis not present

## 2023-05-10 DIAGNOSIS — I4891 Unspecified atrial fibrillation: Secondary | ICD-10-CM | POA: Diagnosis not present

## 2023-05-10 DIAGNOSIS — J440 Chronic obstructive pulmonary disease with acute lower respiratory infection: Secondary | ICD-10-CM | POA: Diagnosis not present

## 2023-05-16 ENCOUNTER — Other Ambulatory Visit (HOSPITAL_COMMUNITY): Payer: Self-pay

## 2023-05-16 ENCOUNTER — Ambulatory Visit (INDEPENDENT_AMBULATORY_CARE_PROVIDER_SITE_OTHER): Payer: Medicare HMO | Admitting: Family Medicine

## 2023-05-16 ENCOUNTER — Encounter: Payer: Self-pay | Admitting: Family Medicine

## 2023-05-16 VITALS — BP 114/62 | HR 67 | Temp 97.7°F | Ht 63.0 in | Wt 145.0 lb

## 2023-05-16 DIAGNOSIS — H6123 Impacted cerumen, bilateral: Secondary | ICD-10-CM

## 2023-05-16 MED ORDER — NITROGLYCERIN 0.4 MG SL SUBL
0.4000 mg | SUBLINGUAL_TABLET | SUBLINGUAL | 3 refills | Status: DC | PRN
  Filled 2023-05-16: qty 50, 30d supply, fill #0

## 2023-05-16 NOTE — Progress Notes (Signed)
Subjective:   Patient has a lateral hearing loss.  He wears hearing aids.  Recently he went to his audiologist because he felt that his hearing aids were not functioning properly and they diagnosed the patient with bilateral cerumen impactions.  He is here today to have the wax removed. 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   Altered mental status 08/21/2018   Anginal chest pain at rest [facility])    Chronic non, controlled on Lorazepam   Anxiety    Arthritis    "all over"    BPH (benign prostatic hyperplasia)    CAD (coronary artery disease)    Cellulitis of arm, right 06/01/2018   Cellulitis of left axilla    Chronic lower back pain    Complication of anesthesia    "problems making water afterwards"   Depression    DJD (degenerative joint disease)    Esophageal ulcer without bleeding    bid ppi indefinitely   Fall 07/16/2018   Family history of adverse reaction to anesthesia    "children w/PONV"   GERD (gastroesophageal reflux disease)    Headache    "couple/week maybe" (09/04/2015)   Hemochromatosis    Possible, elevated iron stores, hook-like osteophytes on hand films, normal LFTs   Hepatitis    "yellow jaundice as a baby"   History of ASCVD    MULTIVESSEL   Hyperlipidemia    Hypertension    Hypotension    Mild aortic stenosis 06/23/2021   Osteoarthritis    Pneumonia    "several times; got a little now" (09/04/2015)   Pulmonary embolism (HCC) 02/02/2018   Rash and nonspecific skin eruption 06/01/2018   Rheumatoid arthritis (HCC)    RSV (respiratory syncytial virus pneumonia) 05/15/2022   Subdural hematoma (HCC)    small after fall 09/2016-plavix held, neurosurgery consulted.  Asymptomatic.     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  he failed PO Eliquis   Past Surgical History:  Procedure Laterality Date   ARM SKIN LESION BIOPSY / EXCISION Left 09/02/2015   CATARACT EXTRACTION W/ INTRAOCULAR LENS  IMPLANT, BILATERAL Bilateral    CHOLECYSTECTOMY OPEN     CORNEAL TRANSPLANT Bilateral    "one at [facility]; one at [facility]"   CORONARY ANGIOPLASTY WITH STENT PLACEMENT     CORONARY ARTERY BYPASS GRAFT  1999   DESCENDING AORTIC ANEURYSM REPAIR W/ STENT     DE stent ostium into the right radial free graft at OM-, 02-2007   ESOPHAGOGASTRODUODENOSCOPY N/A 11/09/2014   Procedure: ESOPHAGOGASTRODUODENOSCOPY (EGD);  Surgeon: Dorena Cookey, MD;  Location: [facility];  Service: Endoscopy;  Laterality: N/A;   INGUINAL HERNIA REPAIR     JOINT REPLACEMENT     LEFT HEART CATH AND CORS/GRAFTS ANGIOGRAPHY N/A 06/27/2017   Procedure: LEFT HEART CATH AND CORS/GRAFTS ANGIOGRAPHY;  Surgeon: Lennette Bihari, MD;  Location: [facility] INVASIVE CV LAB;  Service: Cardiovascular;  Laterality: N/A;   NASAL SINUS SURGERY     SHOULDER OPEN ROTATOR CUFF REPAIR Right    TOTAL KNEE ARTHROPLASTY Bilateral    Current Outpatient Medications on File Prior to Visit  Medication Sig Dispense Refill   acetaminophen (TYLENOL) 500 MG tablet Take 500 mg by mouth 2 (two) times daily as needed for mild pain, moderate pain, fever or headache.  apixaban (ELIQUIS) 2.5 MG TABS tablet Take 1 tablet (2.5 mg total) by mouth 2 (two) times daily. 180 tablet 3   budesonide-formoterol (SYMBICORT) 160-4.5 MCG/ACT inhaler Inhale 2 puffs into the lungs in the morning and at bedtime. 10.2 g 12   ciprofloxacin (CILOXAN) 0.3 % ophthalmic solution Place 2 drops into both eyes every 4 (four) hours while awake. Administer 1 drop, every 2 hours, while awake, for 2 days. Then 1 drop, every 4 hours, while awake, for the next 5 days. 5 mL 0   finasteride (PROSCAR) 5 MG tablet Take 1 tablet (5 mg total) by mouth daily. 90 tablet 3   FLUoxetine (PROZAC) 20 MG capsule TAKE 1 CAPSULE EVERY DAY 90 capsule 3    folic acid (FOLVITE) 1 MG tablet Take 1 tablet (1 mg total) by mouth daily. 30 tablet 2   furosemide (LASIX) 40 MG tablet TAKE 1 TABLET EVERY DAY 90 tablet 0   Iron, Ferrous Sulfate, 325 (65 Fe) MG TABS Take 325 mg by mouth daily. 30 tablet 3   metoprolol tartrate (LOPRESSOR) 25 MG tablet Take 0.5 tablets (12.5 mg total) by mouth See admin instructions. Take 12.5 mg (1/2 tablet) daily. May take an additional 1/2 tablet daily if needed for chest pain or elevated blood pressure. 180 tablet 1   Multiple Vitamins-Minerals (MENS 50+ MULTIVITAMIN) TABS Take 1 tablet by mouth daily.     pantoprazole (PROTONIX) 40 MG tablet TAKE 1 TABLET EVERY MORNING 90 tablet 0   rosuvastatin (CRESTOR) 5 MG tablet TAKE 1 TABLET EVERY DAY (Patient taking differently: Take 5 mg by mouth at bedtime.) 30 tablet 11   senna-docusate (STIMULANT LAXATIVE) 8.6-50 MG tablet Take 2 tablets by mouth 2 (two) times daily as needed for constipation 60 tablet 2   tamsulosin (FLOMAX) 0.4 MG CAPS capsule Take 1 capsule (0.4 mg total) by mouth daily with supper. (Patient taking differently: Take 0.4 mg by mouth at bedtime.) 90 capsule 3   Zinc Oxide (TRIPLE PASTE) 12.8 % ointment Apply topically 4 (four) times daily as needed (skin barrier). (Patient taking differently: Apply 1 Application topically 2 (two) times daily as needed (skin barrier).) 56.7 g 0   No current facility-administered medications on file prior to visit.   Allergies  Allergen Reactions   Feldene [Piroxicam] Rash    Blistering rash     Neomycin-Polymyxin-Hc Itching   Statins Other (See Comments)    Myalgias   Latex Rash    Band Aid   Tequin [Gatifloxacin] Swelling and Anxiety   Valium [Diazepam] Other (See Comments)    Dizziness    Vibramycin [Doxycycline] Itching and Nausea Only   Social History   Socioeconomic History   Marital status: Widowed    Spouse name: Not on file   Number of children: 2   Years of education: 15   Highest education level: 12th  grade  Occupational History   Not on file  Tobacco Use   Smoking status: Former    Types: Cigarettes    Passive exposure: Yes   Smokeless tobacco: Former    Types: Chew   Tobacco comments:    "quit chewing in the 1960s"    Verified by Daughter, [name]  Vaping Use   Vaping status: Never Used  Substance and Sexual Activity   Alcohol use: No   Drug use: No   Sexual activity: Never  Other Topics Concern   Not on file  Social History Narrative   Not on file   Social  Drivers of Health   Financial Resource Strain: Low Risk  (04/20/2023)   Overall Financial Resource Strain (CARDIA)    Difficulty of Paying Living Expenses: Not hard at all  Food Insecurity: No Food Insecurity (04/20/2023)   Hunger Vital Sign    Worried About Running Out of Food in the Last Year: Never true    Ran Out of Food in the Last Year: Never true  Transportation Needs: No Transportation Needs (04/20/2023)   PRAPARE - Administrator, Civil Service (Medical): No    Lack of Transportation (Non-Medical): No  Physical Activity: Insufficiently Active (04/20/2023)   Exercise Vital Sign    Days of Exercise per Week: 5 days    Minutes of Exercise per Session: 10 min  Stress: No Stress Concern Present (04/20/2023)   [employer] of Occupational Health - Occupational Stress Questionnaire    Feeling of Stress : Not at all  Social Connections: Socially Isolated (04/20/2023)   Social Connection and Isolation Panel [NHANES]    Frequency of Communication with Friends and Family: Three times a week    Frequency of Social Gatherings with Friends and Family: Twice a week    Attends Religious Services: Never    Database administrator or Organizations: No    Attends Banker Meetings: Never    Marital Status: Widowed  Intimate Partner Violence: Not At Risk (04/20/2023)   Humiliation, Afraid, Rape, and Kick questionnaire    Fear of Current or Ex-Partner: No    Emotionally Abused: No     Physically Abused: No    Sexually Abused: No      Review of Systems     Objective:   Physical Exam Vitals reviewed.  Constitutional:      General: He is not in acute distress.    Appearance: Normal appearance. He is not toxic-appearing or diaphoretic.  HENT:     Right Ear: There is impacted cerumen.     Left Ear: There is impacted cerumen.  Cardiovascular:     Rate and Rhythm: Normal rate and regular rhythm.     Heart sounds: Normal heart sounds. No murmur heard.    No friction rub. No gallop.  Pulmonary:     Effort: Pulmonary effort is normal. No respiratory distress.     Breath sounds: No wheezing, rhonchi or rales.    Neurological:     Mental Status: He is alert.   Right-sided hemiparesis with weakness in his right leg.  Weakness with knee flexion and extension and ankle flexion and extension.  Ataxia in the right upper arm and right lower leg.     Assessment & Plan:  Bilateral impacted cerumen Cerumen impactions were removed with irrigation and lavage.

## 2023-05-17 ENCOUNTER — Ambulatory Visit: Payer: Self-pay | Admitting: *Deleted

## 2023-05-17 NOTE — Patient Outreach (Signed)
  Care Coordination   05/17/2023 Name: Evan [name] MRN: [id number] DOB: July 23, 1927   Care Coordination Outreach Attempts:  An unsuccessful outreach was attempted for an appointment today.  Follow Up Plan:  Additional outreach attempts will be made to offer the patient complex care management information and services.   Encounter Outcome:  No Answer   Care Coordination Interventions:  No, not indicated    Doyel Mulkern L. Noelle Penner, RN, BSN, CCM [place]  [facility], [facility] RN Care Manager Direct Dial: [phone]  Fax: [phone] Mailing Address: [address]  [place] [address] Website: [email or web address]

## 2023-05-17 NOTE — Patient Instructions (Signed)
Visit Information  Thank you for taking time to visit with me today. Please don't hesitate to contact me if I can be of assistance to you.   Following are the goals we discussed today:   Goals Addressed             This Visit's Progress    [facility] care coordination services   Not on track    Interventions Today    Flowsheet Row Most Recent Value  Chronic Disease   Chronic disease during today's visit Other  [UTI management, skin care/tape/supplies]  General Interventions   General Interventions Discussed/Reviewed Communication with, Doctor Visits, General Interventions Reviewed  Doctor Visits Discussed/Reviewed PCP, Specialist  Durable Medical Equipment (DME) Other  Communication with PCP/Specialists  Mental Health Interventions   Mental Health Discussed/Reviewed Mental Health Reviewed, Coping Strategies  Pharmacy Interventions   Pharmacy Dicussed/Reviewed Pharmacy Topics Reviewed, Affording Medications              Our next appointment is by telephone on 09/12/23 at 0900  Please call the care guide team at [phone] if you need to cancel or reschedule your appointment.   If you are experiencing a Mental Health or Behavioral Health Crisis or need someone to talk to, please call the Suicide and Crisis Lifeline: 988 call the [place] National Suicide Prevention Lifeline: 2186928898 or TTY: 858-693-2573 TTY 437-473-1407) to talk to a trained counselor call 1-800-273-TALK (toll free, 24 hour hotline) call the [facility]: [phone] call 911   Patient verbalizes understanding of instructions and care plan provided today and agrees to view in MyChart. Active MyChart status and patient understanding of how to access instructions and care plan via MyChart confirmed with patient.     The patient has been provided with contact information for the care management team and has been advised to call with any health related questions or concerns.    Kinzee Happel L.  Noelle Penner, RN, BSN, CCM [id number] [facility] RN Care Manager [phone]

## 2023-05-24 ENCOUNTER — Ambulatory Visit: Payer: Medicare HMO | Admitting: Podiatry

## 2023-05-24 ENCOUNTER — Encounter: Payer: Self-pay | Admitting: Podiatry

## 2023-05-24 DIAGNOSIS — M79675 Pain in left toe(s): Secondary | ICD-10-CM

## 2023-05-24 DIAGNOSIS — Z7901 Long term (current) use of anticoagulants: Secondary | ICD-10-CM

## 2023-05-24 DIAGNOSIS — M79674 Pain in right toe(s): Secondary | ICD-10-CM | POA: Diagnosis not present

## 2023-05-24 DIAGNOSIS — B351 Tinea unguium: Secondary | ICD-10-CM

## 2023-05-24 NOTE — Progress Notes (Signed)
  Subjective:  Patient ID: Evan [name], male    DOB: [birth year],   MRN: [id number]  No chief complaint on file.   89 y.o. male presents for concern of thickened elongated and painful nails that are difficult to trim. Requesting to have them trimmed today. Relates burning and tingling in their feet. Patient with PVD and on blood thinner.   PCP:  Donita Brooks, MD    . Denies any other pedal complaints. Denies n/v/f/c.   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   Altered mental status 08/21/2018   Anginal chest pain at rest [facility])    Chronic non, controlled on Lorazepam   Anxiety    Arthritis    "all over"    BPH (benign prostatic hyperplasia)    CAD (coronary artery disease)    Cellulitis of arm, right 06/01/2018   Cellulitis of left axilla    Chronic lower back pain    Complication of anesthesia    "problems making water afterwards"   Depression    DJD (degenerative joint disease)    Esophageal ulcer without bleeding    bid ppi indefinitely   Fall 07/16/2018   Family history of adverse reaction to anesthesia    "children w/PONV"   GERD (gastroesophageal reflux disease)    Headache    "couple/week maybe" (09/04/2015)   Hemochromatosis    Possible, elevated iron stores, hook-like osteophytes on hand films, normal LFTs   Hepatitis    "yellow jaundice as a baby"   History of ASCVD    MULTIVESSEL   Hyperlipidemia    Hypertension    Hypotension    Mild aortic stenosis 06/23/2021   Osteoarthritis    Pneumonia    "several times; got a little now" (09/04/2015)   Pulmonary embolism (HCC) 02/02/2018   Rash and nonspecific skin eruption 06/01/2018   Rheumatoid arthritis (HCC)    RSV (respiratory syncytial virus pneumonia) 05/15/2022   Subdural hematoma  (HCC)    small after fall 09/2016-plavix held, neurosurgery consulted.  Asymptomatic.     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 he failed PO Eliquis    Objective:  Physical Exam: Vascular: DP/PT pulses 1/4 bilateral. CFT <3 seconds. Absent hair growth on digits. Edema noted to bilateral lower extremities. Xerosis noted bilaterally.  Skin. No lacerations or abrasions bilateral feet. Nails 1-5 bilateral  are thickened discolored and elongated with subungual debris.  Musculoskeletal: MMT 5/5 bilateral lower extremities in DF, PF, Inversion and Eversion. Deceased ROM in DF of ankle joint.  Neurological: Sensation intact to light touch. Protective sensation diminished bilateral.    Assessment:   1. Pain due to onychomycosis of toenails of both feet   2. Chronic anticoagulation      Plan:  Patient was evaluated and treated and all questions answered. -Mechanically debrided all nails 1-5 bilateral using sterile nail nipper and filed with dremel without incident  -Answered all patient questions -Patient to return  in 3 months for at risk foot care -Patient advised to call the office if any problems or questions arise in the meantime.   Louann Sjogren, DPM

## 2023-05-30 ENCOUNTER — Ambulatory Visit: Payer: Self-pay | Admitting: Family Medicine

## 2023-05-30 ENCOUNTER — Other Ambulatory Visit (HOSPITAL_COMMUNITY): Payer: Self-pay

## 2023-05-30 NOTE — Telephone Encounter (Signed)
 Copied from CRM [id number]. Topic: Clinical - Red Word Triage >> May 30, 2023  2:20 PM Shelah Lewandowsky wrote: Red Word that prompted transfer to Nurse Triage: woke up with chest pain  Chief Complaint: Chest pain Symptoms: Chest pain Frequency: 30 minutes Pertinent Negatives: Patient denies difficulty breathing Disposition: [x] ED /[] Urgent Care (no appt availability in office) / [] Appointment(In office/virtual)/ []  [facility]/ [] Home Care/ [x] Refused Recommended Disposition /[] [facility]/ []  Follow-up with PCP Additional Notes: Patient called in to report chest pain that started when he woke up from his nap 30 minutes ago. Patient is hard of hearing and has a history of dementia. Patient stated the pain is in the left side of his chest. Patient stated the pain does not go anywhere else. Patient stated the pain is present for 10 minutes at a time and goes away. Patient rated the pain an 8 and changed his rating to a 5 or 6, later in the call. Patient repeatedly stated "the pain is not that bad". Patient has extensive cardiac history. Patient denied all of the following: difficulty breathing, dizziness, nausea, sweating and coughing. The call disconnected. When this RN called the patient back, his son answered the phone. Patient's son explained that his dad has dementia and believes he is supposed to see the doctor today. Patient's son advised this RN that he would "be the first person to take care of his dad, if something were really wrong". This RN advised patient/son to go to the ED if the patient is truly experiencing severe chest pain, per protocol and patient's age/history. Patient/son refused ED at this time.   Reason for Disposition  [1] Chest pain lasts > 5 minutes AND [2] age > 36  Answer Assessment - Initial Assessment Questions 1. LOCATION: "Where does it hurt?"       Left side of chest 2. RADIATION: "Does the pain go anywhere else?" (e.g., into neck, jaw, arms, back)      Denies radiation, denies discomfort in neck and jaw, denies discomfort in arm  3. ONSET: "When did the chest pain begin?" (Minutes, hours or days)      States it started hurting when he woke up from his nap about 30 minutes ago 4. PATTERN: "Does the pain come and go, or has it been constant since it started?"  "Does it get worse with exertion?"      States the pain goes away sometimes 5. DURATION: "How long does it last" (e.g., seconds, minutes, hours)     States pain stays for 10 minutes at a time 6. SEVERITY: "How bad is the pain?"  (e.g., Scale 1-10; mild, moderate, or severe)    - MILD (1-3): doesn't interfere with normal activities     - MODERATE (4-7): interferes with normal activities or awakens from sleep    - SEVERE (8-10): excruciating pain, unable to do any normal activities       States pain is about an 8, changed rating to 5 or 6 when this RN asked later in the call, mild squeezing sensation, denies pressure, denies heaviness, states pain is "not that bad" 7. CARDIAC RISK FACTORS: "Do you have any history of heart problems or risk factors for heart disease?" (e.g., angina, prior heart attack; diabetes, high blood pressure, high cholesterol, smoker, or strong family history of heart disease)     Son states he had a MI about 10 years ago (see chart for additional risk factors) 9. CAUSE: "What do you think is  causing the chest pain?"     Unknown 10. OTHER SYMPTOMS: "Do you have any other symptoms?" (e.g., dizziness, nausea, vomiting, sweating, fever, difficulty breathing, cough)     Denies difficulty breathing, denies dizziness, denies nausea, denies sweating, denies coughing  Protocols used: Chest Pain-A-AH

## 2023-05-30 NOTE — Telephone Encounter (Signed)
 Attempted to call pt's number to f/u with msg, however unable to, phone was off the not working.

## 2023-06-02 ENCOUNTER — Other Ambulatory Visit: Payer: Self-pay

## 2023-06-02 ENCOUNTER — Telehealth: Payer: Self-pay | Admitting: Family Medicine

## 2023-06-02 DIAGNOSIS — R3914 Feeling of incomplete bladder emptying: Secondary | ICD-10-CM | POA: Diagnosis not present

## 2023-06-02 DIAGNOSIS — D649 Anemia, unspecified: Secondary | ICD-10-CM

## 2023-06-02 DIAGNOSIS — R531 Weakness: Secondary | ICD-10-CM

## 2023-06-02 MED ORDER — IRON (FERROUS SULFATE) 325 (65 FE) MG PO TABS: 325.0000 mg | ORAL_TABLET | Freq: Every day | ORAL | 3 refills | Status: DC

## 2023-06-02 NOTE — Telephone Encounter (Signed)
 Prescription Request  06/02/2023  LOV: 05/16/2023  What is the name of the medication or equipment?   Iron, Ferrous Sulfate, 325 (65 Fe) MG TABS   Have you contacted your pharmacy to request a refill? Yes   Which pharmacy would you like this sent to?  [facility] - [place], [place] - [address] [place] [address] Phone: [phone] Fax: [phone]    Patient notified that their request is being sent to the clinical staff for review and that they should receive a response within 2 business days.   Please advise pharmacist.

## 2023-06-05 ENCOUNTER — Other Ambulatory Visit (HOSPITAL_COMMUNITY): Payer: Self-pay

## 2023-06-06 ENCOUNTER — Telehealth: Payer: Self-pay | Admitting: *Deleted

## 2023-06-06 NOTE — Progress Notes (Signed)
 Complex Care Management Care Guide Note  06/06/2023 Name: Evan [name] MRN: [id number] DOB: [birth year]  Evan [name] is a 89 y.o. year old male who is a primary care patient of Tanya Nones, Priscille Heidelberg, MD and is actively engaged with the care management team. I reached out to [name] by phone today to assist with re-scheduling  with the RN Case Manager.  Follow up plan: Unsuccessful telephone outreach attempt made. A HIPAA compliant phone message was left for the patient providing contact information and requesting a return call.  [name]  [facility]  [facility], [facility] Guide  Direct Dial: [phone]  Fax [phone]

## 2023-06-07 ENCOUNTER — Other Ambulatory Visit: Payer: Self-pay | Admitting: Family Medicine

## 2023-06-07 NOTE — Telephone Encounter (Signed)
 Copied from CRM [id number]. Topic: Clinical - Medication Refill >> Jun 07, 2023  2:51 PM Tiffany B wrote: Most Recent Primary Care Visit:  Provider: Lynnea Ferrier T  Department: [facility]  Visit Type: OFFICE VISIT  Date: 05/16/2023  Medication: budesonide-formoterol (SYMBICORT) 160-4.5 MCG/ACT inhaler  Has the patient contacted their pharmacy? Yes,   (Agent: If yes, when and what did the pharmacy advise?) Contact PCP office because several request have been sent to PCP.   Is this the correct pharmacy for this prescription? Yes  [facility] at [facility] [address] [place], [place], [place] [address], [phone]    Has the prescription been filled recently? No  Is the patient out of the medication? No  Has the patient been seen for an appointment in the last year OR does the patient have an upcoming appointment? Yes  Can we respond through MyChart? No  Agent: Please be advised that Rx refills may take up to 3 business days. We ask that you follow-up with your pharmacy.

## 2023-06-08 ENCOUNTER — Encounter (HOSPITAL_COMMUNITY): Payer: Self-pay

## 2023-06-08 ENCOUNTER — Other Ambulatory Visit (HOSPITAL_COMMUNITY): Payer: Self-pay

## 2023-06-08 MED ORDER — BUDESONIDE-FORMOTEROL FUMARATE 160-4.5 MCG/ACT IN AERO
2.0000 | INHALATION_SPRAY | Freq: Two times a day (BID) | RESPIRATORY_TRACT | 12 refills | Status: DC
  Filled 2023-06-08: qty 10.2, 30d supply, fill #0
  Filled 2023-07-12: qty 10.2, 30d supply, fill #1
  Filled 2023-09-18: qty 10.2, 30d supply, fill #2
  Filled 2023-10-31: qty 10.2, 30d supply, fill #3
  Filled 2023-12-08: qty 10.2, 30d supply, fill #4
  Filled 2024-01-07: qty 10.2, 30d supply, fill #5
  Filled 2024-02-08: qty 10.2, 30d supply, fill #6
  Filled 2024-03-27: qty 10.2, 30d supply, fill #7

## 2023-06-08 NOTE — Telephone Encounter (Signed)
 Requested medication (s) are due for refill today- yes  Requested medication (s) are on the active medication list -yes  Future visit scheduled -no  Last refill: 05/17/22 10.2g 12RF  Notes to clinic: outside provider Rx- sent for review of request   Requested Prescriptions  Pending Prescriptions Disp Refills   budesonide-formoterol (SYMBICORT) 160-4.5 MCG/ACT inhaler 10.2 g 12    Sig: Inhale 2 puffs into the lungs in the morning and at bedtime.     Pulmonology:  Combination Products Failed - 06/08/2023  2:15 PM      Failed - Valid encounter within last 12 months    Recent Outpatient Visits           1 year ago Acute pain of right shoulder   [facility] Pickard, Priscille Heidelberg, MD   1 year ago Dysuria   [facility] Donita Brooks, MD   2 years ago Acute systolic congestive heart failure (HCC)   [facility] Donita Brooks, MD   2 years ago Acute cough   [facility] Valentino Nose, NP   2 years ago Need for immunization against influenza   [facility] Pickard, Priscille Heidelberg, MD       Future Appointments             In 3 weeks Pricilla Riffle, MD [facility] at [facility], [facility]               Requested Prescriptions  Pending Prescriptions Disp Refills   budesonide-formoterol (SYMBICORT) 160-4.5 MCG/ACT inhaler 10.2 g 12    Sig: Inhale 2 puffs into the lungs in the morning and at bedtime.     Pulmonology:  Combination Products Failed - 06/08/2023  2:15 PM      Failed - Valid encounter within last 12 months    Recent Outpatient Visits           1 year ago Acute pain of right shoulder   [facility] Pickard, Priscille Heidelberg, MD   1 year ago Dysuria   [facility] Donita Brooks, MD   2 years ago Acute systolic congestive heart failure [facility])   [facility] Donita Brooks, MD   2 years ago Acute cough   [facility] Valentino Nose, NP   2 years ago Need for immunization against influenza   [facility] Pickard, Priscille Heidelberg, MD       Future Appointments             In 3 weeks Pricilla Riffle, MD [facility] at [facility], [facility]

## 2023-06-09 ENCOUNTER — Other Ambulatory Visit (HOSPITAL_COMMUNITY): Payer: Self-pay

## 2023-06-12 NOTE — Progress Notes (Signed)
 Complex Care Management Care Guide Note  06/12/2023 Name: MARICUS TANZI MRN: [id number] DOB: 10-May-1927  [name] [name] is a 89 y.o. year old male who is a primary care patient of Tanya Nones, Priscille Heidelberg, MD and is actively engaged with the care management team. I reached out to [name] by phone today to assist with re-scheduling  with the RN Case Manager.  Follow up plan: Telephone appointment with complex care management team member scheduled for:  3/26  [name]  [facility]  Value-Based Care Institute, [facility] Guide  Direct Dial: [phone]  Fax [phone]

## 2023-06-22 ENCOUNTER — Other Ambulatory Visit: Payer: Self-pay | Admitting: Family Medicine

## 2023-06-22 ENCOUNTER — Ambulatory Visit (INDEPENDENT_AMBULATORY_CARE_PROVIDER_SITE_OTHER): Admitting: Family Medicine

## 2023-06-22 ENCOUNTER — Encounter: Payer: Self-pay | Admitting: Family Medicine

## 2023-06-22 VITALS — BP 112/62 | HR 52 | Temp 97.6°F | Ht 63.0 in | Wt 145.0 lb

## 2023-06-22 DIAGNOSIS — L03119 Cellulitis of unspecified part of limb: Secondary | ICD-10-CM

## 2023-06-22 DIAGNOSIS — L02619 Cutaneous abscess of unspecified foot: Secondary | ICD-10-CM

## 2023-06-22 MED ORDER — ERYTHROMYCIN 5 MG/GM OP OINT: 1.0000 | TOPICAL_OINTMENT | Freq: Every day | OPHTHALMIC | 4 refills | Status: DC

## 2023-06-22 MED ORDER — CEPHALEXIN 500 MG PO CAPS: 500.0000 mg | ORAL_CAPSULE | Freq: Three times a day (TID) | ORAL | 0 refills | Status: DC

## 2023-06-22 NOTE — Progress Notes (Signed)
 Error

## 2023-06-22 NOTE — Progress Notes (Signed)
 Subjective:   Patient reports pain in the tip of his left toe.  Its his first toe.  Distal to the IP joint around the toenail, the skin is erythematous warm and tender.  He also complains of tenderness and pain when I press on his toenail.  He appears to be getting cellulitis in the tip of his foot.  Capillary refill is less than 3 seconds.  The foot is warm and well-perfused.  Daughter was concerned it may be gout however it seems to be unrelated to her joint but rather in the skin distal to the joint 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   Altered mental status 08/21/2018   Anginal chest pain at rest [facility])    Chronic non, controlled on Lorazepam   Anxiety    Arthritis    "all over"    BPH (benign prostatic hyperplasia)    CAD (coronary artery disease)    Cellulitis of arm, right 06/01/2018   Cellulitis of left axilla    Chronic lower back pain    Complication of anesthesia    "problems making water afterwards"   Depression    DJD (degenerative joint disease)    Esophageal ulcer without bleeding    bid ppi indefinitely   Fall 07/16/2018   Family history of adverse reaction to anesthesia    "children w/PONV"   GERD (gastroesophageal reflux disease)    Headache    "couple/week maybe" (09/04/2015)   Hemochromatosis    Possible, elevated iron stores, hook-like osteophytes on hand films, normal LFTs   Hepatitis    "yellow jaundice as a baby"   History of ASCVD    MULTIVESSEL   Hyperlipidemia    Hypertension    Hypotension    Mild aortic stenosis 06/23/2021   Osteoarthritis    Pneumonia    "several times; got a little now" (09/04/2015)   Pulmonary embolism (HCC) 02/02/2018   Rash and nonspecific skin eruption 06/01/2018   Rheumatoid arthritis (HCC)    RSV  (respiratory syncytial virus pneumonia) 05/15/2022   Subdural hematoma (HCC)    small after fall 09/2016-plavix held, neurosurgery consulted.  Asymptomatic.     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 he failed PO Eliquis   Past Surgical History:  Procedure Laterality Date   ARM SKIN LESION BIOPSY / EXCISION Left 09/02/2015   CATARACT EXTRACTION W/ INTRAOCULAR LENS  IMPLANT, BILATERAL Bilateral    CHOLECYSTECTOMY OPEN     CORNEAL TRANSPLANT Bilateral    "one at [facility]; one at [facility]"   CORONARY ANGIOPLASTY WITH STENT PLACEMENT     CORONARY ARTERY BYPASS GRAFT  1999   DESCENDING AORTIC ANEURYSM REPAIR W/ STENT     DE stent ostium into the right radial free graft at OM-, 02-2007   ESOPHAGOGASTRODUODENOSCOPY N/A 11/09/2014   Procedure: ESOPHAGOGASTRODUODENOSCOPY (EGD);  Surgeon: Dorena Cookey, MD;  Location: [facility] ENDOSCOPY;  Service: Endoscopy;  Laterality: N/A;   INGUINAL HERNIA REPAIR     JOINT REPLACEMENT     LEFT HEART CATH AND CORS/GRAFTS ANGIOGRAPHY N/A 06/27/2017   Procedure: LEFT HEART CATH AND CORS/GRAFTS ANGIOGRAPHY;  Surgeon: Lennette Bihari, MD;  Location: [facility] INVASIVE CV LAB;  Service: Cardiovascular;  Laterality: N/A;   NASAL SINUS SURGERY     SHOULDER OPEN ROTATOR CUFF REPAIR Right  TOTAL KNEE ARTHROPLASTY Bilateral    Current Outpatient Medications on File Prior to Visit  Medication Sig Dispense Refill   acetaminophen (TYLENOL) 500 MG tablet Take 500 mg by mouth 2 (two) times daily as needed for mild pain, moderate pain, fever or headache.     apixaban (ELIQUIS) 2.5 MG TABS tablet Take 1 tablet (2.5 mg total) by mouth 2 (two) times daily. 180 tablet 3   budesonide-formoterol (SYMBICORT) 160-4.5 MCG/ACT inhaler Inhale 2 puffs into the lungs in the morning and at bedtime. 10.2 g 12   ciprofloxacin (CILOXAN) 0.3 % ophthalmic solution Place 2 drops into both eyes every 4 (four) hours while awake. Administer 1 drop, every 2 hours, while awake, for 2 days. Then 1 drop,  every 4 hours, while awake, for the next 5 days. 5 mL 0   finasteride (PROSCAR) 5 MG tablet Take 1 tablet (5 mg total) by mouth daily. 90 tablet 3   FLUoxetine (PROZAC) 20 MG capsule TAKE 1 CAPSULE EVERY DAY 90 capsule 3   folic acid (FOLVITE) 1 MG tablet Take 1 tablet (1 mg total) by mouth daily. 30 tablet 2   furosemide (LASIX) 40 MG tablet TAKE 1 TABLET EVERY DAY 90 tablet 0   Iron, Ferrous Sulfate, 325 (65 Fe) MG TABS Take 325 mg by mouth daily. 30 tablet 3   metoprolol tartrate (LOPRESSOR) 25 MG tablet Take 0.5 tablets (12.5 mg total) by mouth See admin instructions. Take 12.5 mg (1/2 tablet) daily. May take an additional 1/2 tablet daily if needed for chest pain or elevated blood pressure. 180 tablet 1   Multiple Vitamins-Minerals (MENS 50+ MULTIVITAMIN) TABS Take 1 tablet by mouth daily.     nitroGLYCERIN (NITROSTAT) 0.4 MG SL tablet Place 1 tablet (0.4 mg total) under the tongue every 5 (five) minutes as needed for chest pain. 50 tablet 3   pantoprazole (PROTONIX) 40 MG tablet TAKE 1 TABLET EVERY MORNING 90 tablet 0   rosuvastatin (CRESTOR) 5 MG tablet TAKE 1 TABLET EVERY DAY (Patient taking differently: Take 5 mg by mouth at bedtime.) 30 tablet 11   senna-docusate (STIMULANT LAXATIVE) 8.6-50 MG tablet Take 2 tablets by mouth 2 (two) times daily as needed for constipation 60 tablet 2   tamsulosin (FLOMAX) 0.4 MG CAPS capsule Take 1 capsule (0.4 mg total) by mouth daily with supper. (Patient taking differently: Take 0.4 mg by mouth at bedtime.) 90 capsule 3   Zinc Oxide (TRIPLE PASTE) 12.8 % ointment Apply topically 4 (four) times daily as needed (skin barrier). (Patient taking differently: Apply 1 Application topically 2 (two) times daily as needed (skin barrier).) 56.7 g 0   No current facility-administered medications on file prior to visit.   Allergies  Allergen Reactions   Feldene [Piroxicam] Rash    Blistering rash     Neomycin-Polymyxin-Hc Itching   Statins Other (See Comments)     Myalgias   Latex Rash    Band Aid   Tequin [Gatifloxacin] Swelling and Anxiety   Valium [Diazepam] Other (See Comments)    Dizziness    Vibramycin [Doxycycline] Itching and Nausea Only   Social History   Socioeconomic History   Marital status: Widowed    Spouse name: Not on file   Number of children: 2   Years of education: 39   Highest education level: 12th grade  Occupational History   Not on file  Tobacco Use   Smoking status: Former    Types: Cigarettes    Passive exposure: Yes  Smokeless tobacco: Former    Types: Chew   Tobacco comments:    "quit chewing in the 1960s"    Verified by Daughter, [name]  Vaping Use   Vaping status: Never Used  Substance and Sexual Activity   Alcohol use: No   Drug use: No   Sexual activity: Never  Other Topics Concern   Not on file  Social History Narrative   Not on file   Social Drivers of Health   Financial Resource Strain: Low Risk  (04/20/2023)   Overall Financial Resource Strain (CARDIA)    Difficulty of Paying Living Expenses: Not hard at all  Food Insecurity: No Food Insecurity (04/20/2023)   Hunger Vital Sign    Worried About Running Out of Food in the Last Year: Never true    Ran Out of Food in the Last Year: Never true  Transportation Needs: No Transportation Needs (04/20/2023)   PRAPARE - Administrator, Civil Service (Medical): No    Lack of Transportation (Non-Medical): No  Physical Activity: Insufficiently Active (04/20/2023)   Exercise Vital Sign    Days of Exercise per Week: 5 days    Minutes of Exercise per Session: 10 min  Stress: No Stress Concern Present (04/20/2023)   [employer] of Occupational Health - Occupational Stress Questionnaire    Feeling of Stress : Not at all  Social Connections: Socially Isolated (04/20/2023)   Social Connection and Isolation Panel [NHANES]    Frequency of Communication with Friends and Family: Three times a week    Frequency of Social Gatherings  with Friends and Family: Twice a week    Attends Religious Services: Never    Database administrator or Organizations: No    Attends Banker Meetings: Never    Marital Status: Widowed  Intimate Partner Violence: Not At Risk (04/20/2023)   Humiliation, Afraid, Rape, and Kick questionnaire    Fear of Current or Ex-Partner: No    Emotionally Abused: No    Physically Abused: No    Sexually Abused: No      Review of Systems     Objective:   Physical Exam Vitals reviewed.  Constitutional:      General: He is not in acute distress.    Appearance: Normal appearance. He is not toxic-appearing or diaphoretic.  Cardiovascular:     Rate and Rhythm: Normal rate and regular rhythm.     Pulses:          Dorsalis pedis pulses are 1+ on the right side and 1+ on the left side.     Heart sounds: Normal heart sounds. No murmur heard.    No friction rub. No gallop.  Pulmonary:     Effort: Pulmonary effort is normal. No respiratory distress.     Breath sounds: No wheezing, rhonchi or rales.    Musculoskeletal:        General: Tenderness and deformity present.     Right lower leg: No edema.     Left lower leg: No edema.       Feet:  Skin:    Findings: Erythema present.  Neurological:     Mental Status: He is alert.   Right-sided hemiparesis with weakness in his right leg.  Weakness with knee flexion and extension and ankle flexion and extension.  Ataxia in the right upper arm and right lower leg.       Assessment & Plan:  Cellulitis and abscess of foot I believe the patient  has cellulitis in his left great toe distal to the IP joint.  Begin Keflex 500 mg 3 times a day for 7 days and reassess.  He also has blepharitis in both sets of eyelids.  Recommended erythromycin ointment once daily for 5 days and then daily lid scrubs with baby shampoo

## 2023-06-28 ENCOUNTER — Ambulatory Visit: Payer: Self-pay

## 2023-06-28 DIAGNOSIS — I10 Essential (primary) hypertension: Secondary | ICD-10-CM

## 2023-06-28 DIAGNOSIS — I25810 Atherosclerosis of coronary artery bypass graft(s) without angina pectoris: Secondary | ICD-10-CM

## 2023-06-28 NOTE — Patient Outreach (Signed)
 Care Coordination   Initial Visit Note   06/28/2023 Name: Evan [name] MRN: [id number] DOB: 01/30/1928  Evan [name] is a 89 y.o. year old male who sees Pickard, Priscille Heidelberg, MD for primary care. I spoke with daughter Berna Spare by phone today.  What matters to the patients health and wellness today?  Daughter is concerned about patient's left great toe infection.     Goals Addressed             This Visit's Progress    To reduce risk for complications from cellulitis to left great toe       Care Coordination Interventions: Evaluation of current treatment plan related to cellulitis to left great toe and patient's adherence to plan as established by provider Determined patient is experiencing pain, swelling and redness to his left great toe Reviewed and discussed patient's recent PCP follow up for evaluation and treatment of this condition  Reviewed medications with patient and discussed importance of medication adherence, educated daughter on the importance of completing full course of antibiotic, completed medication reconciliation  Discussed patient will complete his antibiotic tomorrow, his toe is still painful and the appearance is unchanged  Discussed this RN will notify patient's PCP of the current status of his toe Educated daughter of signs/symptoms suggestive of worsening infection and when to call the doctor  Sent an in basket message to PCP provider, Dr. Lynnea Ferrier with a patient status update Discussed plans with patient for ongoing care coordination follow up and provided patient with direct contact information for nurse care coordinator Scheduled nurse follow up call with daughter Sonnie Alamo for 07/10/23 @11 :30 AM      Interventions Today    Flowsheet Row Most Recent Value  Chronic Disease   Chronic disease during today's visit Other  [Cellulitis of left great toe]  General Interventions   General Interventions Discussed/Reviewed General Interventions Discussed,  General Interventions Reviewed, Doctor Visits, Labs, Communication with  Doctor Visits Discussed/Reviewed Doctor Visits Discussed, Doctor Visits Reviewed, PCP  Communication with PCP/Specialists  [Dr. Pickard]  Education Interventions   Education Provided Provided Education  Provided Verbal Education On When to see the doctor, Medication  Pharmacy Interventions   Pharmacy Dicussed/Reviewed Pharmacy Topics Reviewed, Pharmacy Topics Discussed, Affording Medications, Referral to Pharmacist  Referral to Pharmacist Cannot afford medications  [Eliquis]          SDOH assessments and interventions completed:  Yes  SDOH Interventions Today    Flowsheet Row Most Recent Value  SDOH Interventions   Food Insecurity Interventions Intervention Not Indicated  Housing Interventions Intervention Not Indicated  Transportation Interventions Intervention Not Indicated  Utilities Interventions Intervention Not Indicated        Care Coordination Interventions:  Yes, provided   Follow up plan: Referral made to [id number] pharmacy assistance needed with cost of Eliquis Follow up call scheduled for 07/10/23 @11 :30 AM    Encounter Outcome:  Patient Visit Completed

## 2023-06-29 ENCOUNTER — Other Ambulatory Visit: Payer: Self-pay | Admitting: Family Medicine

## 2023-06-29 ENCOUNTER — Telehealth: Payer: Self-pay

## 2023-06-29 ENCOUNTER — Other Ambulatory Visit (HOSPITAL_COMMUNITY): Payer: Self-pay

## 2023-06-29 DIAGNOSIS — M79676 Pain in unspecified toe(s): Secondary | ICD-10-CM

## 2023-06-29 MED ORDER — PREDNISONE 20 MG PO TABS
ORAL_TABLET | ORAL | 0 refills | Status: AC
  Filled 2023-06-29: qty 12, 6d supply, fill #0

## 2023-06-29 NOTE — Telephone Encounter (Signed)
 LM for pt's daughter, Sonnie Alamo, to call office to discuss. Mjp,lpn  Donita Brooks, MD  Darral Dash, LPN 2 things 1) try prednisone taper pack in case this is gout.   2) obtain arterial dopplers with ABI to rule out ischemia as a cause of toe pain.  I will send in prednisone and order dopplers.       From: Riley Churches, RN  Sent: 06/28/2023  11:14 AM EDT  To: Donita Brooks, MD  Subject: patient status update                          Hello Dr. Tanya Nones, I am one of the nurse case managers with [facility] now assigned to your office. I just spoke with this patient's daughter who stated patient will complete his final dose of Cephalexin tomorrow. She reported patient continues to have pain from his left great toe and feels there is no change in the appearance. Patient does not have a return appointment. I wanted to make you aware. Please let me know how I may further assist. Thanks!!   Delsa Sale RN BSN CCM  [place]  [facility], [facility]  Nurse Care Coordinator  Direct Dial: [phone]  Website: [email or web address]

## 2023-06-29 NOTE — Telephone Encounter (Signed)
 Danella Maiers, RPH  Snead, Toy Care, NT; Little, Karma Lew, RN; Darral Dash, LPN Called daughter Victorio Palm at [phone] and no answer; called son, very nice but he didn't know what was going on--left VM w/ daughter explaining that there is very little assistance for eliquis (requirements for this program are very hard to meet, pt must spend 3% out of pocket 1st on RX meds) Encouraged them to reach out to PCP to find solution-->we usually have to transition patient to warfarin if eliquis isn't affordable If you guys can circle back to make sure he has appt with PCP if needed for alternative (I.e. warfarin).  I prefer not to switch bc INR appts get to be very tedious and patient is 89.  Eliquis would be preferred. They can also call the insurance to be put on a payment plan (if available) You can schedule with me, but there is not much I can do with eliquis :(  Thanks! Raynelle Fanning, PharmD

## 2023-07-02 ENCOUNTER — Other Ambulatory Visit: Payer: Self-pay | Admitting: Family Medicine

## 2023-07-04 ENCOUNTER — Ambulatory Visit: Payer: Medicare HMO | Admitting: Internal Medicine

## 2023-07-04 NOTE — Telephone Encounter (Signed)
 Requested Prescriptions  Pending Prescriptions Disp Refills   pantoprazole (PROTONIX) 40 MG tablet [Pharmacy Med Name: Pantoprazole Sodium Oral Tablet Delayed Release 40 MG] 90 tablet 0    Sig: TAKE 1 TABLET EVERY MORNING     Gastroenterology: Proton Pump Inhibitors Passed - 07/04/2023  2:57 PM      Passed - Valid encounter within last 12 months    Recent Outpatient Visits           1 week ago Cellulitis and abscess of foot   [place] [facility] Pickard, Priscille Heidelberg, MD   1 month ago Bilateral impacted cerumen   [place] [facility] Donita Brooks, MD   3 months ago Sepsis due to Haemophilus influenzae, unspecified whether acute organ dysfunction present [facility])   [place] [facility] Donita Brooks, MD   6 months ago Diarrhea, unspecified type   [place] [facility] Donita Brooks, MD   9 months ago Foreign body in ear, unspecified laterality, initial encounter   [place] [facility] Pickard, Priscille Heidelberg, MD       Future Appointments             In 1 week Monge, Petra Kuba, NP  [facility]

## 2023-07-06 DIAGNOSIS — R3914 Feeling of incomplete bladder emptying: Secondary | ICD-10-CM | POA: Diagnosis not present

## 2023-07-08 ENCOUNTER — Other Ambulatory Visit: Payer: Self-pay | Admitting: Family Medicine

## 2023-07-08 DIAGNOSIS — I5021 Acute systolic (congestive) heart failure: Secondary | ICD-10-CM

## 2023-07-10 ENCOUNTER — Ambulatory Visit: Payer: Self-pay

## 2023-07-10 VITALS — BP 102/60 | HR 59

## 2023-07-10 DIAGNOSIS — I739 Peripheral vascular disease, unspecified: Secondary | ICD-10-CM

## 2023-07-10 DIAGNOSIS — I10 Essential (primary) hypertension: Secondary | ICD-10-CM

## 2023-07-10 DIAGNOSIS — L03032 Cellulitis of left toe: Secondary | ICD-10-CM

## 2023-07-10 NOTE — Patient Outreach (Signed)
 Complex Care Management   Visit Note  07/10/2023  Name:  Evan [name] MRN: [id number] DOB: 22-Aug-1927  Situation: Referral received for Complex Care Management related to  Cellulitis to left Great Toe  I obtained verbal consent from POA, daughter Evan [name].  Visit completed with daughter Evan [name]  on the phone.  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   Altered mental status 08/21/2018   Anginal chest pain at rest [facility])    Chronic non, controlled on Lorazepam   Anxiety    Arthritis    "all over"    BPH (benign prostatic hyperplasia)    CAD (coronary artery disease)    Cellulitis of arm, right 06/01/2018   Cellulitis of left axilla    Chronic lower back pain    Complication of anesthesia    "problems making water afterwards"   Depression    DJD (degenerative joint disease)    Esophageal ulcer without bleeding    bid ppi indefinitely   Fall 07/16/2018   Family history of adverse reaction to anesthesia    "children w/PONV"   GERD (gastroesophageal reflux disease)    Headache    "couple/week maybe" (09/04/2015)   Hemochromatosis    Possible, elevated iron stores, hook-like osteophytes on hand films, normal LFTs   Hepatitis    "yellow jaundice as a baby"   History of ASCVD    MULTIVESSEL   Hyperlipidemia    Hypertension    Hypotension    Mild aortic stenosis 06/23/2021   Osteoarthritis    Pneumonia    "several times; got a Percell Lamboy now" (09/04/2015)   Pulmonary embolism (HCC) 02/02/2018   Rash and nonspecific skin eruption 06/01/2018   Rheumatoid arthritis (HCC)    RSV (respiratory syncytial virus pneumonia) 05/15/2022   Subdural hematoma (HCC)    small after fall 09/2016-plavix held, neurosurgery consulted.  Asymptomatic.      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 he failed PO Eliquis    Assessment: Patient Reported Symptoms:  Cognitive Confused or disoriented (daughter reports daily confusion, she is able to reorient him, she will let PCP know)  Neurological Hearing changes (patient wears hearing aids)    HEENT Change or loss of hearing (daughter will call Audiologist to report changes)    Cardiovascular Dizziness (daughter will discuss with PCP)    Respiratory Shortness of breath (alerted PCP to new onset of SOB w/exertion)    Endocrine No symptoms reported    Gastrointestinal No symptoms reported    Genitourinary Other indwelling catheter  Integumentary Bruising    Musculoskeletal Unsteady gait, Difficulty walking    Psychosocial Anxiety - if selected complete GAD     Vitals:   07/10/23 1143 07/10/23 1144  BP: 102/60 102/60  Pulse: (!) 58 (!) 59    Medications Reviewed Today     Reviewed by Riley Churches, RN (Registered Nurse) on 07/10/23 at 1154  Med List Status: <None>   Medication Order Taking? Sig Documenting Provider Last Dose Status Informant  acetaminophen (TYLENOL) 500 MG tablet [id number] Yes Take 500 mg by mouth 2 (two) times daily as needed for mild pain, moderate pain, fever or headache. [provider] Taking Active Child, Pharmacy Records  apixaban (ELIQUIS) 2.5 MG TABS tablet [id number] Yes Take 1  tablet (2.5 mg total) by mouth 2 (two) times daily. Gaston Islam., NP Taking Active Child, Pharmacy Records  budesonide-formoterol [facility]) 160-4.5 MCG/ACT inhaler [id number] Yes Inhale 2 puffs into the lungs in the morning and at bedtime. Donita Brooks, MD Taking Active   cephALEXin (KEFLEX) 500 MG capsule [id number]  Take 1 capsule (500 mg total) by mouth 3 (three) times daily. Donita Brooks, MD  Consider Medication Status and Discontinue (Completed Course)   ciprofloxacin (CILOXAN) 0.3 % ophthalmic solution [id number] Yes Place 2 drops into  both eyes every 4 (four) hours while awake. Administer 1 drop, every 2 hours, while awake, for 2 days. Then 1 drop, every 4 hours, while awake, for the next 5 days.  Patient taking differently: Place 2 drops into both eyes every 4 (four) hours while awake. Patient is taking as needed.   Carollee Herter, DO Taking Active   erythromycin ophthalmic ointment [id number] No Place 1 Application into both eyes at bedtime.  Patient not taking: Reported on 07/10/2023   Donita Brooks, MD Not Taking Active   finasteride (PROSCAR) 5 MG tablet [id number] No Take 1 tablet (5 mg total) by mouth daily.  Patient not taking: Reported on 06/28/2023   Donita Brooks, MD Not Taking Active Child, Pharmacy Records  FLUoxetine [facility]) 20 MG capsule [id number] Yes TAKE 1 CAPSULE EVERY DAY Donita Brooks, MD Taking Active   folic acid (FOLVITE) 1 MG tablet [id number] Yes Take 1 tablet (1 mg total) by mouth daily. Donita Brooks, MD Taking Active   furosemide (LASIX) 40 MG tablet [id number] Yes TAKE 1 TABLET EVERY DAY Donita Brooks, MD Taking Active   Iron, Ferrous Sulfate, 325 (65 Fe) MG TABS [id number] Yes Take 325 mg by mouth daily.  Patient taking differently: Take 325 mg by mouth daily. Patient is taking every other day   Donita Brooks, MD Taking Active   metoprolol tartrate (LOPRESSOR) 25 MG tablet [id number] Yes Take 0.5 tablets (12.5 mg total) by mouth See admin instructions. Take 12.5 mg (1/2 tablet) daily. May take an additional 1/2 tablet daily if needed for chest pain or elevated blood pressure. Donita Brooks, MD Taking Active   Multiple Vitamins-Minerals (MENS 50+ MULTIVITAMIN) TABS [id number] Yes Take 1 tablet by mouth daily. [provider] Taking Active Child, Pharmacy Records  nitroGLYCERIN (NITROSTAT) 0.4 MG SL tablet [id number] Yes Place 1 tablet (0.4 mg total) under the tongue every 5 (five) minutes as needed for chest pain. Donita Brooks, MD Taking Active   pantoprazole (PROTONIX) 40  MG tablet [id number] Yes TAKE 1 TABLET EVERY MORNING Donita Brooks, MD Taking Active   rosuvastatin (CRESTOR) 5 MG tablet [id number] Yes TAKE 1 TABLET EVERY DAY  Patient taking differently: Take 5 mg by mouth at bedtime.   Corky Crafts, MD Taking Active Child, Pharmacy Records  senna-docusate (STIMULANT LAXATIVE) 8.6-50 MG tablet [id number] Yes Take 2 tablets by mouth 2 (two) times daily as needed for constipation  Taking Active Child, Pharmacy Records  tamsulosin (FLOMAX) 0.4 MG CAPS capsule [id number] Yes Take 1 capsule (0.4 mg total) by mouth daily with supper.  Patient taking differently: Take 0.4 mg by mouth at bedtime.   Donita Brooks, MD Taking Active Child, Pharmacy Records  Zinc Oxide (TRIPLE PASTE) 12.8 % ointment [id number] Yes Apply topically 4 (four) times daily as needed (skin barrier).  Patient taking differently: Apply 1 Application topically 2 (two) times daily as needed (skin barrier).   Lowell Guitar,  A Charlyn Minerva., MD Taking Active Child, Pharmacy Records            Recommendation:   PCP Follow-up Referral to: [facility] to assist with cost of Symbicort  Follow Up Plan:   Telephone follow up appointment date/time:  08/02/23 @12 :30 PM  Delsa Sale RN BSN CCM [place]  [facility], [facility] Nurse Care Coordinator  Direct Dial: [phone] Website: [email or web address]

## 2023-07-10 NOTE — Patient Instructions (Signed)
 Visit Information  Thank you for taking time to visit with me today. Please don't hesitate to contact me if I can be of assistance to you before our next scheduled appointment.  Our next appointment is by telephone on 08/02/23 at 12:30 PM Please call the care guide team at [phone] if you need to cancel or reschedule your appointment.   Following is a copy of your care plan:   Goals Addressed             This Visit's Progress    COMPLETED: To reduce risk for complications from cellulitis to left great toe       Care Coordination Interventions: Evaluation of current treatment plan related to cellulitis to left great toe and patient's adherence to plan as established by provider See updated goal      [facility] RN Care Plan related to Cellulits to left great toe       Problems:  Knowledge Deficits related to Cellulitis to left Great Toe  Goal: Over the next 3-4 weeks the Caregiver will attend all scheduled medical appointments: as directed as evidenced by patient will complete his ABI appointment as scheduled for evaluation of PAD        verbalize understanding of plan for management of Cellulitis of left Great Toe as evidenced by patient's caregiver will verbalize knowledge and understanding of patient's prescribed treatment plan following his ABI testing  Interventions:   Pain Interventions:  (Status:  New goal.) Short Term Goal Pain assessment performed Medications reviewed Reviewed provider established plan for pain management Discussed importance of adherence to all scheduled medical appointments Counseled on the importance of reporting any/all new or changed pain symptoms or management strategies to pain management provider Advised patient to report to care team affect of pain on daily activities Reviewed with patient prescribed pharmacological and nonpharmacological pain relief strategies  Patient Self-Care Activities:  Call provider office for new concerns or questions   keep all doctor appointments report new symptoms to your doctor  Plan:  Telephone follow up appointment with care management team member scheduled for:  08/02/23 @12 :30 PM         Please call [phone]-TALK (toll free, 24 hour hotline) if you are experiencing a Mental Health or Behavioral Health Crisis or need someone to talk to.  Patient verbalizes understanding of instructions and care plan provided today and agrees to view in MyChart. Active MyChart status and patient understanding of how to access instructions and care plan via MyChart confirmed with patient.     Delsa Sale RN BSN CCM [place]  [facility], [facility] Nurse Care Coordinator  Direct Dial: [phone] Website: [email or web address]

## 2023-07-10 NOTE — Telephone Encounter (Signed)
 Requested Prescriptions  Pending Prescriptions Disp Refills   furosemide (LASIX) 40 MG tablet [Pharmacy Med Name: Furosemide Oral Tablet 40 MG] 90 tablet 3    Sig: TAKE 1 TABLET EVERY DAY     Cardiovascular:  Diuretics - Loop Failed - 07/10/2023 11:31 AM      Failed - Valid encounter within last 6 months    Recent Outpatient Visits           2 weeks ago Cellulitis and abscess of foot   [place] [facility] Donita Brooks, MD   1 month ago Bilateral impacted cerumen   [place] [facility] Donita Brooks, MD   3 months ago Sepsis due to Haemophilus influenzae, unspecified whether acute organ dysfunction present [facility])   [place] [facility] Donita Brooks, MD   6 months ago Diarrhea, unspecified type   [place] [facility] Donita Brooks, MD   9 months ago Foreign body in ear, unspecified laterality, initial encounter   [place] [facility] Pickard, Priscille Heidelberg, MD       Future Appointments             Tomorrow Monge, Petra Kuba, NP [place] [facility] in normal range and within 180 days    Potassium  Date Value Ref Range Status  03/17/2023 3.6 3.5 - 5.3 mmol/L Final  08/16/2016 4.3 3.5 - 5.1 mEq/L Final         Passed - Ca in normal range and within 180 days    Calcium  Date Value Ref Range Status  03/17/2023 8.8 8.6 - 10.3 mg/dL Final  52/84/1324 9.0 8.4 - 10.4 mg/dL Final   Calcium, Ion  Date Value Ref Range Status  03/08/2023 1.05 (L) 1.15 - 1.40 mmol/L Final         Passed - Na in normal range and within 180 days    Sodium  Date Value Ref Range Status  03/17/2023 137 135 - 146 mmol/L Final  10/14/2022 136 134 - 144 mmol/L Final  08/16/2016 143 136 - 145 mEq/L Final         Passed - Cr in normal range and within 180 days    Creatinine  Date Value Ref Range Status  08/16/2016 1.2 0.7 - 1.3 mg/dL Final    Creat  Date Value Ref Range Status  03/17/2023 1.08 0.70 - 1.22 mg/dL Final         Passed - Cl in normal range and within 180 days    Chloride  Date Value Ref Range Status  03/17/2023 104 98 - 110 mmol/L Final  08/16/2016 110 (H) 98 - 109 mEq/L Final         Passed - Mg Level in normal range and within 180 days    Magnesium  Date Value Ref Range Status  03/13/2023 1.9 1.7 - 2.4 mg/dL Final    Comment:    Performed at [facility], [address], [place], [place] [address]         Passed - Last BP in normal range    BP Readings from Last 1 Encounters:  06/22/23 112/62

## 2023-07-11 ENCOUNTER — Encounter: Payer: Self-pay | Admitting: Nurse Practitioner

## 2023-07-11 ENCOUNTER — Ambulatory Visit: Attending: Nurse Practitioner | Admitting: Cardiology

## 2023-07-11 ENCOUNTER — Telehealth: Payer: Self-pay | Admitting: *Deleted

## 2023-07-11 VITALS — BP 97/55 | HR 62 | Ht 61.0 in | Wt 144.0 lb

## 2023-07-11 DIAGNOSIS — I2510 Atherosclerotic heart disease of native coronary artery without angina pectoris: Secondary | ICD-10-CM | POA: Diagnosis not present

## 2023-07-11 DIAGNOSIS — I959 Hypotension, unspecified: Secondary | ICD-10-CM | POA: Diagnosis not present

## 2023-07-11 DIAGNOSIS — I4821 Permanent atrial fibrillation: Secondary | ICD-10-CM | POA: Diagnosis not present

## 2023-07-11 DIAGNOSIS — E785 Hyperlipidemia, unspecified: Secondary | ICD-10-CM

## 2023-07-11 DIAGNOSIS — I5032 Chronic diastolic (congestive) heart failure: Secondary | ICD-10-CM | POA: Diagnosis not present

## 2023-07-11 NOTE — Progress Notes (Signed)
 Care Guide Pharmacy Note  07/11/2023 Name: Evan [name] MRN: [id number] DOB: Jul 27, 1927  Referred By: Donita Brooks, MD Reason for referral: Complex Care Management (Outreach to schedule referral with pharmacist )   Evan [name] is a 89 y.o. year old male who is a primary care patient of Donita Brooks, MD.  Evan [name] was referred to the pharmacist for assistance related to: COPD  Successful contact was made with the patient to discuss pharmacy services including being ready for the pharmacist to call at least 5 minutes before the scheduled appointment time and to have medication bottles and any blood pressure readings ready for review. The patient agreed to meet with the pharmacist via telephone visit on 08/04/2023  Burman Nieves, CMA [place]  [facility], [facility] Guide Direct Dial: [phone]  Fax: [phone] Website: [email or web address]

## 2023-07-11 NOTE — Progress Notes (Addendum)
 Cardiology Office Note:  .   Date:  07/11/2023  ID:  Evan [name], DOB 05-14-1927, MRN [id number] PCP: Evan Brooks, MD  [facility] Providers Cardiologist:  Evan Muss, MD {  History of Present Illness: .   Evan [name] is a 89 y.o. male with history of CAD status post CABG x2 1998, DES to LCx 2008, HFpEF, permanent atrial fibrillation, aortic stenosis, hypertension, hyperlipidemia, CVA, subdural hematoma 2018, NSCLC of the right lung, hemochromatosis, severe dementia, thrombocytopenia/anemia.  Patient has significant CAD as noted above. Last cardiac catheterization was in 2019 demonstrating patent stent in the free radial-OM1, LCx, LIMA to LAD.  Mild plaque in the RCA.  He presented again in June 2024 as NSTEMI but given significant dementia and perioperative risk, cardiac catheterization deferred.  Maximized on medical management.  He was last seen in clinic July 2024 without any complaints of chest pain.  Daughter at the time had admitted previous admission may be related to panic attack.    Today patient presents for follow-up.  He is accompanied with both daughter and son.  He is noncontributory to visit and very sleepy.  Daughter has several concerns and reports that he has been more sleepy lately and not eating as much, but he is drinking Ensure.  He has short bursts of rapid mouth breathing that last for approximately a couple minutes and spontaneously resolves, sounds more behavioral.  They thought he might have been bradycardic at one point but with his atrial fibrillation likely related to pulse oximeter reading error.  Has not had any significant chest pain, reports that in the last year they have only had to use it twice.  Patient is essentially wheelchair-bound.  ROS: Denies: Chest pain, shortness of breath, orthopnea, peripheral edema, palpitations, decreased exercise intolerance, fatigue, lightheadedness.   Studies Reviewed: .   Cardiac Studies & Procedures    ______________________________________________________________________________________________     ECHOCARDIOGRAM  ECHOCARDIOGRAM COMPLETE 03/10/2023  Narrative ECHOCARDIOGRAM REPORT    Patient Name:   Evan [name] Date of Exam: 03/10/2023 Medical Rec #:  [id number]    Height:       63.0 in Accession #:    [id number]   Weight:       143.5 lb Date of Birth:  1927-07-24    BSA:          1.679 m Patient Age:    89 years     BP:           108/61 mmHg Patient Gender: M            HR:           77 bpm. Exam Location:  Inpatient  Procedure: 2D Echo, Cardiac Doppler and Color Doppler  Indications:    Cardiomegaly  History:        Patient has prior history of Echocardiogram examinations, most recent 04/28/2022. CAD, Prior CABG, Stroke and COPD, Aortic Valve Disease, Arrythmias:Atrial Fibrillation, Signs/Symptoms:Hypotension, Altered Mental Status and Shortness of Breath; Risk Factors:Hypertension and Dyslipidemia. Dementia, lung CA.  Sonographer:    Evan [name] Referring Phys: [id number] Evan [name]   Sonographer Comments: Image acquisition challenging due to patient body habitus. IMPRESSIONS   1. Left ventricular ejection fraction, by estimation, is 60 to 65%. The left ventricle has normal function. The left ventricle has no regional wall motion abnormalities. There is mild left ventricular hypertrophy. Left ventricular diastolic parameters are indeterminate. 2. Right ventricular systolic function is normal. The right ventricular size is normal. There  is moderately elevated pulmonary artery systolic pressure. The estimated right ventricular systolic pressure is 54.8 mmHg. 3. Left atrial size was severely dilated. 4. Right atrial size was moderately dilated. 5. The mitral valve is normal in structure. Mild mitral valve regurgitation. 6. Tricuspid valve regurgitation is moderate. 7. The aortic valve is tricuspid. Aortic valve regurgitation is trivial. Aortic valve  sclerosis/calcification is present, without any evidence of aortic stenosis. 8. The inferior vena cava is normal in size with greater than 50% respiratory variability, suggesting right atrial pressure of 3 mmHg.  FINDINGS Left Ventricle: Left ventricular ejection fraction, by estimation, is 60 to 65%. The left ventricle has normal function. The left ventricle has no regional wall motion abnormalities. The left ventricular internal cavity size was normal in size. There is mild left ventricular hypertrophy. Abnormal (paradoxical) septal motion consistent with post-operative status. Left ventricular diastolic parameters are indeterminate.  Right Ventricle: The right ventricular size is normal. Right ventricular systolic function is normal. There is moderately elevated pulmonary artery systolic pressure. The tricuspid regurgitant velocity is 3.60 m/s, and with an assumed right atrial pressure of 3 mmHg, the estimated right ventricular systolic pressure is 54.8 mmHg.  Left Atrium: Left atrial size was severely dilated.  Right Atrium: Right atrial size was moderately dilated.  Pericardium: There is no evidence of pericardial effusion.  Mitral Valve: The mitral valve is normal in structure. Mild mitral valve regurgitation.  Tricuspid Valve: Tricuspid valve regurgitation is moderate.  Aortic Valve: The aortic valve is tricuspid. Aortic valve regurgitation is trivial. Aortic valve sclerosis/calcification is present, without any evidence of aortic stenosis.  Pulmonic Valve: Pulmonic valve regurgitation is not visualized.  Aorta: The aortic root and ascending aorta are structurally normal, with no evidence of dilitation.  Venous: The inferior vena cava is normal in size with greater than 50% respiratory variability, suggesting right atrial pressure of 3 mmHg.  IAS/Shunts: The interatrial septum was not well visualized.   LEFT VENTRICLE PLAX 2D LVIDd:         4.40 cm   Diastology LVIDs:          3.00 cm   LV e' medial:    4.88 cm/s LV PW:         1.10 cm   LV E/e' medial:  1.0 LV IVS:        1.10 cm   LV e' lateral:   8.73 cm/s LVOT diam:     2.10 cm   LV E/e' lateral: 0.6 LV SV:         44 LV SV Index:   26 LVOT Area:     3.46 cm   RIGHT VENTRICLE            IVC RV S prime:     9.67 cm/s  IVC diam: 2.20 cm TAPSE (M-mode): 1.7 cm  LEFT ATRIUM              Index        RIGHT ATRIUM           Index LA diam:        4.10 cm  2.44 cm/m   RA Area:     16.90 cm LA Vol (A2C):   106.0 ml 63.12 ml/m  RA Volume:   44.70 ml  26.62 ml/m LA Vol (A4C):   108.0 ml 64.31 ml/m LA Biplane Vol: 110.0 ml 65.50 ml/m AORTIC VALVE LVOT Vmax:   55.60 cm/s LVOT Vmean:  40.900 cm/s LVOT VTI:    0.128  m  AORTA Ao Root diam: 3.60 cm Ao Asc diam:  3.70 cm  MR Peak grad: 97.2 mmHg   TRICUSPID VALVE MR Vmax:      493.00 cm/s TR Peak grad:   51.8 mmHg MV E velocity: 4.88 cm/s  TR Vmax:        360.00 cm/s  SHUNTS Systemic VTI:  0.13 m Systemic Diam: 2.10 cm  Carolan Clines Electronically signed by Carolan Clines Signature Date/Time: 03/10/2023/3:18:32 PM    Final          ______________________________________________________________________________________________       Risk Assessment/Calculations:    CHA2DS2-VASc Score = 7   This indicates a 11.2% annual risk of stroke. The patient's score is based upon: CHF History: 1 HTN History: 1 Diabetes History: 0 Stroke History: 2 Vascular Disease History: 1 Age Score: 2 Gender Score: 0           Physical Exam:   VS:  BP (!) 97/55 (BP Location: Left Arm, Patient Position: Sitting, Cuff Size: Normal)   Pulse 62   Ht 5\' 1"  (1.549 m)   Wt 144 lb (65.3 kg)   SpO2 96%   BMI 27.21 kg/m    Wt Readings from Last 3 Encounters:  07/11/23 144 lb (65.3 kg)  06/22/23 145 lb (65.8 kg)  05/16/23 145 lb (65.8 kg)    GEN: Frail, weak, somnolent NECK: No JVD; No carotid bruits CARDIAC: IRRR RESPIRATORY:  Clear to auscultation without  rales, wheezing or rhonchi  ABDOMEN: Soft, non-tender, non-distended EXTREMITIES:  No edema; No deformity decreased skin turgor  ASSESSMENT AND PLAN: .    CAD status post CABG x2 1998 Hyperlipidemia Last cardiac catheterization was in 2019 demonstrating patent stents and patent grafts. 09/2022 presented as NSTEMI, he is too high risk for procedure and with advanced dementia and comorbid conditions not a good candidate for cardiac catheterization.  Does not have any anginal complaints.  Continue medical management No aspirin with Eliquis and chronic anemia/thrombocytopenia. Continue rosuvastatin 5 mg. Hypotension limit antianginals  Chronic HFpEF Per notes suggest EF 45 to 50% in January 2023.  All records since then have shown preserved EF.  Last echo December 2024 preserved biventricular function.  Moderately elevated RVSP 54.8.  Looks euvolemic if anything looks dehydrated. Changing p.o. Lasix 40 mg from daily to as needed.  Encouraged them to monitor weights, swelling, shortness of breath very closely.  Permanent atrial fibrillation Today's EKG has a lot of artifact suspect he is either in fib versus flutter.  Either way rate controlled with heart rates in the 60s. He is on Eliquis 2.5 mg twice daily for age and weight.  He has chronic thrombocytopenia/anemia but does appear stable.  Wheelchair-bound so overall his bleeding risk likely to be low.  Will defer to primary cardiologist if they feel that it should be stopped Will stop his Lopressor 12.5 mg daily given hypotension/borderline bradycardia and adequate rate control.  Valvular disease Mild MR, moderate TR.  Aortic stenosis not noted on this echocardiogram.  Not a candidate for any valvular procedures should any of these progress.  Hypotension See the above changes.  Encourage hydration and good p.o. intake.  GOC From a cardiac perspective he has stable disease although is showing significant signs of aging and failure to  thrive.  Overall would be conservative with his care.     Dispo: 6 months, he will establish care with Dr. Bjorn Pippin  Signed, Abagail Kitchens, PA-C

## 2023-07-11 NOTE — Patient Instructions (Signed)
 Medication Instructions:  Stop Metoprolol 25 mg as directed Lasix 40 mg daily as needed  *If you need a refill on your cardiac medications before your next appointment, please call your pharmacy*  Lab Work: NONE ordered at this time of appointment   Testing/Procedures: NONE ordered at this time of appointment    Follow-Up: At [facility], you and your health needs are our priority.  As part of our continuing mission to provide you with exceptional heart care, our providers are all part of one team.  This team includes your primary Cardiologist (physician) and Advanced Practice Providers or APPs (Physician Assistants and Nurse Practitioners) who all work together to provide you with the care you need, when you need it.  Your next appointment:   6 month(s)  Provider:   Dr. Bjorn Pippin    We recommend signing up for the patient portal called "MyChart".  Sign up information is provided on this After Visit Summary.  MyChart is used to connect with patients for Virtual Visits (Telemedicine).  Patients are able to view lab/test results, encounter notes, upcoming appointments, etc.  Non-urgent messages can be sent to your provider as well.   To learn more about what you can do with MyChart, go to [email or web address].   Other Instructions       1st Floor: - Lobby - Registration  - Pharmacy  - Lab - Cafe  2nd Floor: - PV Lab - Diagnostic Testing (echo, CT, nuclear med)  3rd Floor: - Vacant  4th Floor: - TCTS (cardiothoracic surgery) - AFib Clinic - Structural Heart Clinic - Vascular Surgery  - Vascular Ultrasound  5th Floor: - [facility]Care Cardiology (general and EP) - Clinical Pharmacy for coumadin, hypertension, lipid, weight-loss medications, and med management appointments    Valet parking services will be available as well.

## 2023-07-12 ENCOUNTER — Other Ambulatory Visit (HOSPITAL_COMMUNITY): Payer: Self-pay

## 2023-07-12 ENCOUNTER — Other Ambulatory Visit: Payer: Self-pay | Admitting: Family Medicine

## 2023-07-12 ENCOUNTER — Ambulatory Visit (HOSPITAL_COMMUNITY)
Admission: RE | Admit: 2023-07-12 | Discharge: 2023-07-12 | Disposition: A | Discharge status: Home / Self Care | Source: Ambulatory Visit | Attending: Family Medicine | Admitting: Family Medicine

## 2023-07-12 DIAGNOSIS — M79675 Pain in left toe(s): Secondary | ICD-10-CM | POA: Diagnosis not present

## 2023-07-12 DIAGNOSIS — M79676 Pain in unspecified toe(s): Secondary | ICD-10-CM | POA: Insufficient documentation

## 2023-07-13 ENCOUNTER — Other Ambulatory Visit: Payer: Self-pay

## 2023-07-13 ENCOUNTER — Other Ambulatory Visit (HOSPITAL_COMMUNITY): Payer: Self-pay

## 2023-07-13 DIAGNOSIS — I7090 Unspecified atherosclerosis: Secondary | ICD-10-CM

## 2023-07-13 DIAGNOSIS — M79676 Pain in unspecified toe(s): Secondary | ICD-10-CM

## 2023-07-13 MED ORDER — FOLIC ACID 1 MG PO TABS
1.0000 mg | ORAL_TABLET | Freq: Every day | ORAL | 0 refills | Status: DC
  Filled 2023-07-13: qty 90, 90d supply, fill #0

## 2023-07-13 NOTE — Telephone Encounter (Signed)
 Requested Prescriptions  Pending Prescriptions Disp Refills   folic acid (FOLVITE) 1 MG tablet 90 tablet 0    Sig: Take 1 tablet (1 mg total) by mouth daily.     Endocrinology:  Vitamins Passed - 07/13/2023  8:17 AM      Passed - Valid encounter within last 12 months    Recent Outpatient Visits           3 weeks ago Cellulitis and abscess of foot   [place] [facility] Pickard, Priscille Heidelberg, MD   1 month ago Bilateral impacted cerumen   [place] [facility] Donita Brooks, MD   3 months ago Sepsis due to Haemophilus influenzae, unspecified whether acute organ dysfunction present [facility])   [place] [facility] Donita Brooks, MD   6 months ago Diarrhea, unspecified type   [facility] [facility] Donita Brooks, MD   9 months ago Foreign body in ear, unspecified laterality, initial encounter   [facility] Pickard, Priscille Heidelberg, MD

## 2023-07-16 LAB — VAS US ABI WITH/WO TBI
Left ABI: 0.72
Right ABI: 0.77

## 2023-07-17 ENCOUNTER — Other Ambulatory Visit (HOSPITAL_COMMUNITY): Payer: Self-pay

## 2023-07-24 ENCOUNTER — Encounter: Payer: Self-pay | Admitting: Podiatry

## 2023-07-24 ENCOUNTER — Ambulatory Visit: Admitting: Podiatry

## 2023-07-24 DIAGNOSIS — L02612 Cutaneous abscess of left foot: Secondary | ICD-10-CM

## 2023-07-24 DIAGNOSIS — M79674 Pain in right toe(s): Secondary | ICD-10-CM | POA: Diagnosis not present

## 2023-07-24 DIAGNOSIS — Z7901 Long term (current) use of anticoagulants: Secondary | ICD-10-CM

## 2023-07-24 DIAGNOSIS — L03032 Cellulitis of left toe: Secondary | ICD-10-CM | POA: Diagnosis not present

## 2023-07-24 DIAGNOSIS — B351 Tinea unguium: Secondary | ICD-10-CM

## 2023-07-24 DIAGNOSIS — M79675 Pain in left toe(s): Secondary | ICD-10-CM

## 2023-07-24 MED ORDER — MUPIROCIN 2 % EX OINT: 1.0000 | TOPICAL_OINTMENT | Freq: Two times a day (BID) | CUTANEOUS | 0 refills | Status: DC

## 2023-07-24 NOTE — Progress Notes (Unsigned)
 He presents to see me today in [place] still complaining of pain to the hallux right.  He has seen Dr. Jennefer Moats on a few occasions where she trim the nail and debrided the material from underneath the nail margin.  States that that made it feel much better until the last couple of times where he really did not improve at all.  Objective:'s are palpable capillary fill time is immediate he has good venous distention.  Toenail hallux left does demonstrate nail dystrophy subungual debris mild erythema along the proximal nail fold eponychium is absent.  Assessment: Mild paronychia onychomycosis chronic anticoagulant.  Plan: Debrided the nail today the margins also prescribed Bactroban  ointment to be placed around the nail margin on a daily basis.  Also instructed soaking at least once a week and using a nail brush to continue to try to debride away the reactive hyperkeratotic tissue along the margins.

## 2023-07-26 NOTE — Addendum Note (Signed)
 Addended by: Pheobe Brass E on: 07/26/2023 11:57 AM   Modules accepted: Level of Service

## 2023-07-27 ENCOUNTER — Other Ambulatory Visit (HOSPITAL_COMMUNITY): Payer: Self-pay

## 2023-07-31 NOTE — Progress Notes (Signed)
 Office Note     CC:  pain in great toe with abnormal ABI Requesting Provider:  Austine Lefort, MD  HPI: Evan [name] is a 89 y.o. (1927-11-04) male presenting at the request of .Austine Lefort, MD with left great toe pain and abnormal ABI.  On exam, Evan [name] was doing well, accompanied by his daughter and son.  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  Evan [name] is very hard of hearing, and a lot of our discussion occurred through his children.  Evan [name] had a stroke 5 years ago.  He has been in a wheelchair since that time and is able to ambulate only with assistance.  These are very short distances.  He wears a brace on the left leg.  For quite some time now, Evan [name] has appreciated pain at the left great toe that waxes and wanes, and is mainly present after it bumps into something, or there is significant pressure.  Denies open wound, erythema, induration.  Denies symptoms of claudication, ischemic rest pain, tissue loss.  Goes to podiatrist for his toenails.   The pt is  on a statin for cholesterol management.  The pt is - on a daily aspirin .   Other AC:  eliquis  The pt is not on medication for hypertension.   The pt is not diabetic.  Tobacco hx:  former  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   Altered mental status 08/21/2018   Anginal chest pain at rest [facility])    Chronic non, controlled on Lorazepam    Anxiety    Arthritis    "all over"    BPH (benign prostatic hyperplasia)    CAD (coronary artery disease)    Cellulitis of arm, right 06/01/2018   Cellulitis of left axilla    Chronic lower back pain    Complication of anesthesia    "problems making water afterwards"   Depression    DJD (degenerative joint disease)    Esophageal ulcer without  bleeding    bid ppi indefinitely   Fall 07/16/2018   Family history of adverse reaction to anesthesia    "children w/PONV"   GERD (gastroesophageal reflux disease)    Headache    "couple/week maybe" (09/04/2015)   Hemochromatosis    Possible, elevated iron  stores, hook-like osteophytes on hand films, normal LFTs   Hepatitis    "yellow jaundice as a baby"   History of ASCVD    MULTIVESSEL   Hyperlipidemia    Hypertension    Hypotension    Mild aortic stenosis 06/23/2021   Osteoarthritis    Pneumonia    "several times; got a little now" (09/04/2015)   Pulmonary embolism (HCC) 02/02/2018   Rash and nonspecific skin eruption 06/01/2018   Rheumatoid arthritis (HCC)    RSV (respiratory syncytial virus pneumonia) 05/15/2022   Subdural hematoma (HCC)    small after fall 09/2016-plavix  held, neurosurgery consulted.  Asymptomatic.     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 PO Eliquis     Past Surgical History:  Procedure Laterality Date   ARM SKIN LESION BIOPSY / EXCISION Left 09/02/2015   CATARACT EXTRACTION W/ INTRAOCULAR LENS  IMPLANT, BILATERAL Bilateral    CHOLECYSTECTOMY OPEN     CORNEAL TRANSPLANT Bilateral    "one at  [facility]; one at [facility]   CORONARY ANGIOPLASTY WITH STENT PLACEMENT     CORONARY ARTERY BYPASS GRAFT  1999   DESCENDING AORTIC ANEURYSM REPAIR W/ STENT     DE stent ostium into the right radial free graft at OM-, 02-2007   ESOPHAGOGASTRODUODENOSCOPY N/A 11/09/2014   Procedure: ESOPHAGOGASTRODUODENOSCOPY (EGD);  Surgeon: Delilah Fend, MD;  Location[facility] ENDOSCOPY;  Service: Endoscopy;  Laterality: N/A;   INGUINAL HERNIA REPAIR     JOINT REPLACEMENT     LEFT HEART CATH AND CORS/GRAFTS ANGIOGRAPHY N/A 06/27/2017   Procedure: LEFT HEART CATH AND CORS/GRAFTS ANGIOGRAPHY;  Surgeon: Millicent Ally, MD;  Location: [facility] INVASIVE CV LAB;  Service: Cardiovascular;  Laterality: N/A;   NASAL SINUS SURGERY     SHOULDER OPEN ROTATOR CUFF REPAIR Right    TOTAL  KNEE ARTHROPLASTY Bilateral     Social History   Socioeconomic History   Marital status: Widowed    Spouse name: Not on file   Number of children: 2   Years of education: 39   Highest education level: 12th grade  Occupational History   Not on file  Tobacco Use   Smoking status: Former    Types: Cigarettes    Passive exposure: Yes   Smokeless tobacco: Former    Types: Chew   Tobacco comments:    "quit chewing in the 1960s"    Verified by Daughter, Zula Hitch  Vaping Use   Vaping status: Never Used  Substance and Sexual Activity   Alcohol  use: No   Drug use: No   Sexual activity: Never  Other Topics Concern   Not on file  Social History Narrative   Not on file   Social Drivers of Health   Financial Resource Strain: Low Risk  (04/20/2023)   Overall Financial Resource Strain (CARDIA)    Difficulty of Paying Living Expenses: Not hard at all  Food Insecurity: No Food Insecurity (07/10/2023)   Hunger Vital Sign    Worried About Running Out of Food in the Last Year: Never true    Ran Out of Food in the Last Year: Never true  Transportation Needs: No Transportation Needs (07/10/2023)   PRAPARE - Administrator, Civil Service (Medical): No    Lack of Transportation (Non-Medical): No  Physical Activity: Insufficiently Active (04/20/2023)   Exercise Vital Sign    Days of Exercise per Week: 5 days    Minutes of Exercise per Session: 10 min  Stress: No Stress Concern Present (04/20/2023)   [employer] of Occupational Health - Occupational Stress Questionnaire    Feeling of Stress : Not at all  Social Connections: Socially Isolated (04/20/2023)   Social Connection and Isolation Panel [NHANES]    Frequency of Communication with Friends and Family: Three times a week    Frequency of Social Gatherings with Friends and Family: Twice a week    Attends Religious Services: Never    Database administrator or Organizations: No    Attends Banker Meetings:  Never    Marital Status: Widowed  Intimate Partner Violence: Not At Risk (07/10/2023)   Humiliation, Afraid, Rape, and Kick questionnaire    Fear of Current or Ex-Partner: No    Emotionally Abused: No    Physically Abused: No    Sexually Abused: No   Family History  Problem Relation Age of Onset   Arthritis-Osteo Sister    Heart attack Brother    Heart attack Other    Cancer Neg  Hx     Current Outpatient Medications  Medication Sig Dispense Refill   acetaminophen  (TYLENOL ) 500 MG tablet Take 500 mg by mouth 2 (two) times daily as needed for mild pain, moderate pain, fever or headache.     apixaban  (ELIQUIS ) 2.5 MG TABS tablet Take 1 tablet (2.5 mg total) by mouth 2 (two) times daily. 180 tablet 3   budesonide -formoterol  (SYMBICORT ) 160-4.5 MCG/ACT inhaler Inhale 2 puffs into the lungs in the morning and at bedtime. 10.2 g 12   cephALEXin  (KEFLEX ) 500 MG capsule Take 1 capsule (500 mg total) by mouth 3 (three) times daily. 21 capsule 0   ciprofloxacin  (CILOXAN ) 0.3 % ophthalmic solution Place 2 drops into both eyes every 4 (four) hours while awake. Administer 1 drop, every 2 hours, while awake, for 2 days. Then 1 drop, every 4 hours, while awake, for the next 5 days. (Patient taking differently: Place 2 drops into both eyes every 4 (four) hours while awake. Patient is taking as needed.) 5 mL 0   erythromycin  ophthalmic ointment Place 1 Application into both eyes at bedtime. 3.5 g 4   finasteride  (PROSCAR ) 5 MG tablet Take 1 tablet (5 mg total) by mouth daily. 90 tablet 3   FLUoxetine  (PROZAC ) 20 MG capsule TAKE 1 CAPSULE EVERY DAY 90 capsule 3   folic acid  (FOLVITE ) 1 MG tablet Take 1 tablet (1 mg total) by mouth daily. 90 tablet 0   furosemide  (LASIX ) 40 MG tablet TAKE 1 TABLET EVERY DAY (Patient taking differently: Take 40 mg by mouth daily as needed for fluid or edema.) 90 tablet 3   Iron , Ferrous Sulfate , 325 (65 Fe) MG TABS Take 325 mg by mouth daily. (Patient taking differently: Take  325 mg by mouth daily. Patient is taking every other day) 30 tablet 3   Multiple Vitamins-Minerals (MENS 50+ MULTIVITAMIN) TABS Take 1 tablet by mouth daily.     mupirocin  ointment (BACTROBAN ) 2 % Apply 1 Application topically 2 (two) times daily. 22 g 0   nitroGLYCERIN  (NITROSTAT ) 0.4 MG SL tablet Place 1 tablet (0.4 mg total) under the tongue every 5 (five) minutes as needed for chest pain. 50 tablet 3   pantoprazole  (PROTONIX ) 40 MG tablet TAKE 1 TABLET EVERY MORNING 90 tablet 0   rosuvastatin  (CRESTOR ) 5 MG tablet TAKE 1 TABLET EVERY DAY (Patient taking differently: Take 5 mg by mouth at bedtime.) 30 tablet 11   senna-docusate (STIMULANT LAXATIVE) 8.6-50 MG tablet Take 2 tablets by mouth 2 (two) times daily as needed for constipation 60 tablet 2   tamsulosin  (FLOMAX ) 0.4 MG CAPS capsule Take 1 capsule (0.4 mg total) by mouth daily with supper. (Patient taking differently: Take 0.4 mg by mouth at bedtime.) 90 capsule 3   Zinc  Oxide (TRIPLE PASTE) 12.8 % ointment Apply topically 4 (four) times daily as needed (skin barrier). (Patient taking differently: Apply 1 Application topically 2 (two) times daily as needed (skin barrier).) 56.7 g 0   No current facility-administered medications for this visit.    Allergies  Allergen Reactions   Feldene [Piroxicam] Rash    Blistering rash     Neomycin -Polymyxin-Hc Itching   Statins Other (See Comments)    Myalgias   Latex Rash    Band Aid   Tequin [Gatifloxacin] Swelling and Anxiety   Valium [Diazepam] Other (See Comments)    Dizziness    Vibramycin  [Doxycycline ] Itching and Nausea Only     REVIEW OF SYSTEMS:  [X]  denotes positive finding, [ ]  denotes negative  finding Cardiac  Comments:  Chest pain or chest pressure:    Shortness of breath upon exertion:    Short of breath when lying flat:    Irregular heart rhythm:        Vascular    Pain in calf, thigh, or hip brought on by ambulation:    Pain in feet at night that wakes you up from  your sleep:     Blood clot in your veins:    Leg swelling:         Pulmonary    Oxygen  at home:    Productive cough:     Wheezing:         Neurologic    Sudden weakness in arms or legs:     Sudden numbness in arms or legs:     Sudden onset of difficulty speaking or slurred speech:    Temporary loss of vision in one eye:     Problems with dizziness:         Gastrointestinal    Blood in stool:     Vomited blood:         Genitourinary    Burning when urinating:     Blood in urine:        Psychiatric    Major depression:         Hematologic    Bleeding problems:    Problems with blood clotting too easily:        Skin    Rashes or ulcers:        Constitutional    Fever or chills:      PHYSICAL EXAMINATION:  There were no vitals filed for this visit.  General:  WDWN in NAD; vital signs documented above Gait: Not observed HENT: WNL, normocephalic Pulmonary: normal non-labored breathing , without wheezing Cardiac: regular HR Abdomen: soft, NT, no masses Skin: without rashes Vascular Exam/Pulses:  Right Left  Radial 2+ (normal) 2+ (normal)  Ulnar    Femoral 2+ (normal) 2+ (normal)  Popliteal    DP absent absent  PT     Extremities: without ischemic changes, without Gangrene , without cellulitis; without open wounds;  Musculoskeletal: no muscle wasting or atrophy  Neurologic: A&O X 3;  No focal weakness or paresthesias are detected Psychiatric:  The pt has Normal affect.   Non-Invasive Vascular Imaging:     ABI Findings:  +---------+------------------+-----+----------+--------+  Right   Rt Pressure (mmHg)IndexWaveform  Comment   +---------+------------------+-----+----------+--------+  Brachial 158                                        +---------+------------------+-----+----------+--------+  PTA     81                0.51 monophasic          +---------+------------------+-----+----------+--------+  DP      121               0.77  monophasic          +---------+------------------+-----+----------+--------+  Great Toe74                0.47 Abnormal            +---------+------------------+-----+----------+--------+   +---------+------------------+-----+----------+-------+  Left    Lt Pressure (mmHg)IndexWaveform  Comment  +---------+------------------+-----+----------+-------+  Brachial 153                                       +---------+------------------+-----+----------+-------+  PTA     254               1.61 monophasic         +---------+------------------+-----+----------+-------+  DP      113               0.72 monophasic         +---------+------------------+-----+----------+-------+  Great Toe57                0.36 Abnormal           +---------+------------------+-----+----------+-------+   +-------+-----------+-----------+------------+------------+  ABI/TBIToday's ABIToday's TBIPrevious ABIPrevious TBI  +-------+-----------+-----------+------------+------------+  Right .77        .47                                  +-------+-----------+-----------+------------+------------+  Left  .72        .36                                  +-------+-----------+-----------+------------+------------+       ASSESSMENT/PLAN: KAMALEI VANDERVOORT is a 89 y.o. male presenting with waxing and waning pain in the left great toe brought on by palpation or accidental injury.  He has no claudication, ischemic rest pain, tissue loss.  I had a long conversation with Evan [name] and his children regarding the above.  Evan [name] has moderate peripheral arterial disease as demonstrated by recent ABI.  This is likely multilevel disease involving the superficial femoral artery, popliteal artery, tibial arteries.  I do not think this is the etiology of his toe pain as usually ischemic rest pain does not wax and wane.  With waxing and waning pain in the left great toe, I think the best course of  action is continued conservative management.   At 52, I was also very frank that they needed to start having discussions goals of care for Evan [name] as he continues to age.  We discussed important things such as DNR/DNI which they plan to discuss as a family.    I asked him to call my office should a new one arise that does not heal after a few weeks.  With his age and comorbidities, intervention will be reserved only for severe ischemic rest pain, critical and ischemia with tissue loss.     Kayla Part, MD [facility] [phone] Total time of patient care including pre-visit research, consultation, and documentation greater than 45 minutes

## 2023-08-02 ENCOUNTER — Telehealth: Payer: Self-pay

## 2023-08-03 ENCOUNTER — Other Ambulatory Visit (HOSPITAL_COMMUNITY): Payer: Self-pay

## 2023-08-03 DIAGNOSIS — R3914 Feeling of incomplete bladder emptying: Secondary | ICD-10-CM | POA: Diagnosis not present

## 2023-08-03 MED ORDER — NYSTATIN 100000 UNIT/GM EX POWD
1.0000 | Freq: Two times a day (BID) | CUTANEOUS | 0 refills | Status: DC
  Filled 2023-08-03: qty 15, 8d supply, fill #0

## 2023-08-04 ENCOUNTER — Encounter: Payer: Self-pay | Admitting: Vascular Surgery

## 2023-08-04 ENCOUNTER — Other Ambulatory Visit: Admitting: Pharmacist

## 2023-08-04 ENCOUNTER — Ambulatory Visit: Attending: Vascular Surgery | Admitting: Vascular Surgery

## 2023-08-04 VITALS — BP 91/58 | HR 65 | Temp 98.0°F | Resp 20 | Ht 61.0 in | Wt 144.0 lb

## 2023-08-04 DIAGNOSIS — M79675 Pain in left toe(s): Secondary | ICD-10-CM

## 2023-08-04 DIAGNOSIS — I739 Peripheral vascular disease, unspecified: Secondary | ICD-10-CM

## 2023-08-04 NOTE — Progress Notes (Signed)
 08/04/2023 Name: Evan [name] MRN: [id number] DOB: 29-Sep-1927  Chief Complaint  Patient presents with   Diabetes    Evan [name] is a 89 y.o. year old male who presented for a telephone visit.   They were referred to the pharmacist by their PCP for assistance in managing complex medication management.    Subjective:  Care Team: Primary Care Provider: Austine Lefort, MD    Medication Access/Adherence  Current Pharmacy:  [facility] - [place], [place] - [address] [place] [address] Phone: [phone] Fax: [phone]  [place] [facility] [address], [address] [place] [address] Phone: [phone] Fax: [phone]  [facility] [phone] - [place], [place] - [address] AT [facility] [address] [place] [address] Phone: [phone] Fax: [phone]  [id number] - [place] [facility] - [address] [place] [address] Phone: [phone] Fax: [phone]   Patient reports affordability concerns with their medications: No  Patient reports access/transportation concerns to their pharmacy: No  Patient reports adherence concerns with their medications:  No  daughter manages   Heart Failure (EF 60-65%):  Sees heart care--requesting new MD as current one is retiring Current medications:  ACEi/ARB/ARNI: n/a SGLT2i: n/a Beta blocker: metoprolol  Mineralocorticoid Receptor Antagonist: n/a Diuretic regimen: lasix  40mg  daily  Current home blood pressure readings: 110-120-70s, HR has been off reports 49-50s Current home weights: within normal limits  Patient denies volume overload signs or symptoms including shortness of breath, lower extremity edema, increased use of pillows at night; Daughter denies patient chest pain, no weight gain, breaths appear to back at baseline  Current physical activity: limited    Current medication access support: humana   Objective:  Lab Results  Component Value Date   HGBA1C 5.7 (H) 09/28/2022    Lab Results  Component Value Date   CREATININE 1.08 03/17/2023   BUN 24 03/17/2023   NA 137 03/17/2023   K 3.6 03/17/2023   CL 104 03/17/2023   CO2 22 03/17/2023    Lab Results  Component Value Date   CHOL 111 09/28/2022   HDL 40 (L) 09/28/2022   LDLCALC 56 09/28/2022   TRIG 75 09/28/2022   CHOLHDL 2.8 09/28/2022    Medications Reviewed Today     Reviewed by Delilah Fend, [facility] (Pharmacist) on 08/04/23 at 1428  Med List Status: <None>   Medication Order Taking? Sig Documenting Provider Last Dose Status Informant  acetaminophen  (TYLENOL ) 500 MG tablet [id number]  Take 500 mg by mouth 2 (two) times daily as needed for mild pain, moderate pain, fever or headache. [provider]  Active Child, Pharmacy Records  apixaban  (ELIQUIS ) 2.5 MG TABS tablet [id number] Yes Take 1 tablet (2.5 mg total) by mouth 2 (two) times daily. Gerald Kitty., NP Taking Active Child, Pharmacy Records  budesonide -formoterol  (SYMBICORT ) 160-4.5 MCG/ACT inhaler [id number] Yes Inhale 2 puffs into the lungs in the morning and at bedtime. Austine Lefort, MD Taking Active   ciprofloxacin  (CILOXAN ) 0.3 % ophthalmic solution [id number]  Place 2 drops into both eyes every 4 (four) hours while awake. Administer 1 drop, every 2 hours, while awake, for 2 days. Then 1 drop, every 4 hours, while awake, for the next 5 days.  Patient taking differently: Place 2 drops into both eyes every 4 (four) hours while awake. Patient is taking as needed.   Unk Garb, DO  Active   erythromycin  ophthalmic ointment [id number]  Place 1 Application into both eyes at bedtime. Austine Lefort, MD  Active   finasteride  (PROSCAR ) 5 MG tablet [id number]  Take 1 tablet (5 mg total) by mouth daily. Austine Lefort, MD  Active Child, Pharmacy Records  FLUoxetine  (PROZAC ) 20 MG capsule [id number]  TAKE 1  CAPSULE EVERY DAY Austine Lefort, MD  Active   folic acid  (FOLVITE ) 1 MG tablet [id number]  Take 1 tablet (1 mg total) by mouth daily. Austine Lefort, MD  Active   furosemide  (LASIX ) 40 MG tablet [id number] Yes TAKE 1 TABLET EVERY DAY  Patient taking differently: Take 40 mg by mouth daily as needed for fluid or edema.   Austine Lefort, MD Taking Active   Iron , Ferrous Sulfate , 325 (65 Fe) MG TABS [id number] Yes Take 325 mg by mouth daily.  Patient taking differently: Take 325 mg by mouth daily. Patient is taking every other day   Austine Lefort, MD Taking Active   metoprolol  succinate (TOPROL -XL) 25 MG 24 hr tablet [id number] Yes Take 12.5 mg by mouth daily. [provider] Taking Active   Multiple Vitamins-Minerals (MENS 50+ MULTIVITAMIN) TABS [id number]  Take 1 tablet by mouth daily. [provider]  Active Child, Pharmacy Records  mupirocin  ointment (BACTROBAN ) 2 % [id number]  Apply 1 Application topically 2 (two) times daily. Hyatt, [name], [place]  Active   nitroGLYCERIN  (NITROSTAT ) 0.4 MG SL tablet [id number]  Place 1 tablet (0.4 mg total) under the tongue every 5 (five) minutes as needed for chest pain. Austine Lefort, MD  Active   nystatin  (MYCOSTATIN /NYSTOP ) powder [id number]  Apply a thin layer topically to the affected area 2-3 (two to three) times daily.   Active   pantoprazole  (PROTONIX ) 40 MG tablet [id number]  TAKE 1 TABLET EVERY MORNING Pickard, Cisco Crest, MD  Active   rosuvastatin  (CRESTOR ) 5 MG tablet [id number]  TAKE 1 TABLET EVERY DAY  Patient taking differently: Take 5 mg by mouth at bedtime.   Lucendia Rusk, MD  Active Child, Pharmacy Records  senna-docusate (STIMULANT LAXATIVE) 8.6-50 MG tablet [id number]  Take 2 tablets by mouth 2 (two) times daily as needed for constipation   Active Child, Pharmacy Records  tamsulosin  (FLOMAX ) 0.4 MG CAPS capsule [id number]  Take 1 capsule (0.4 mg total) by mouth daily with supper.  Patient taking differently: Take  0.4 mg by mouth at bedtime.   Austine Lefort, MD  Active Child, Pharmacy Records  Zinc  Oxide (TRIPLE PASTE) 12.8 % ointment [id number]  Apply topically 4 (four) times daily as needed (skin barrier).  Patient taking differently: Apply 1 Application topically 2 (two) times daily as needed (skin barrier).   Etter Hermann., MD  Active Child, Pharmacy Records             Assessment/Plan:   Heart Failure: - Currently inappropriately managed/opportunity for optimization - Reviewed appropriate blood pressure monitoring (has cuff at home) technique and reviewed goal blood pressure - Reviewed to weigh daily and when to contact cardiology with weight gain -Medication list updated  - Recommend to follow up with PCP and Cardiology Message sent to PCP/RN regarding concerns  Daughter, Johnn Najjar, reports Cardiac PA stopped metoprolol  12.5mg  , lasix  pill Daughter repots he is back on them for about 1 week HR 49-52 increased to the 100s without metoprolol /lasix  40mg  daily -no current patient assistance for Symbicort    Could potentially switch to Breztri inhaler with AZ&me PAP if  symbicort  still not sustainable    Follow Up Plan: PCP   Evan [name], PharmD, BCACP, CPP Clinical Pharmacist, [facility]

## 2023-08-10 ENCOUNTER — Ambulatory Visit (INDEPENDENT_AMBULATORY_CARE_PROVIDER_SITE_OTHER): Admitting: Family Medicine

## 2023-08-10 ENCOUNTER — Encounter: Payer: Self-pay | Admitting: Family Medicine

## 2023-08-10 VITALS — BP 118/62 | HR 85 | Temp 97.3°F | Ht 61.0 in | Wt 144.0 lb

## 2023-08-10 DIAGNOSIS — I5022 Chronic systolic (congestive) heart failure: Secondary | ICD-10-CM | POA: Insufficient documentation

## 2023-08-10 DIAGNOSIS — R051 Acute cough: Secondary | ICD-10-CM

## 2023-08-10 MED ORDER — AMOXICILLIN-POT CLAVULANATE 875-125 MG PO TABS: 1.0000 | ORAL_TABLET | Freq: Two times a day (BID) | ORAL | 0 refills | Status: DC

## 2023-08-10 MED ORDER — AZITHROMYCIN 250 MG PO TABS: ORAL_TABLET | ORAL | 0 refills | Status: DC

## 2023-08-10 NOTE — Progress Notes (Signed)
 Subjective:   Patient is here today with his daughter.  She reports 2 to 3 days of worsening cough and increasing shortness of breath.  On exam today he has prominent right basilar crackles.  These are chronic.  However he also have left basilar crackles.  These are not normally present.  There is no pitting edema in his extremities to suggest fluid overload.  Daughter states that he has been coughing consistently for 3 days and reporting increasing shortness of breath.  She states that several of his family members have had similar symptoms but they improved on their own.  He has some faint expiratory wheezing today but nothing substantial. 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   Altered mental status 08/21/2018   Anginal chest pain at rest [facility])    Chronic non, controlled on Lorazepam    Anxiety    Arthritis    "all over"    BPH (benign prostatic hyperplasia)    CAD (coronary artery disease)    Cellulitis of arm, right 06/01/2018   Cellulitis of left axilla    Chronic lower back pain    Complication of anesthesia    "problems making water afterwards"   Depression    DJD (degenerative joint disease)    Esophageal ulcer without bleeding    bid ppi indefinitely   Fall 07/16/2018   Family history of adverse reaction to anesthesia    "children w/PONV"   GERD (gastroesophageal reflux disease)    Headache    "couple/week maybe" (09/04/2015)   Hemochromatosis    Possible, elevated iron  stores, hook-like osteophytes on hand films, normal LFTs   Hepatitis    "yellow jaundice as a baby"   History of ASCVD    MULTIVESSEL   Hyperlipidemia    Hypertension    Hypotension    Mild aortic stenosis 06/23/2021   Osteoarthritis    Peripheral vascular disease (HCC)     Pneumonia    "several times; got a little now" (09/04/2015)   Pulmonary embolism (HCC) 02/02/2018   Rash and nonspecific skin eruption 06/01/2018   Rheumatoid arthritis (HCC)    RSV (respiratory syncytial virus pneumonia) 05/15/2022   Subdural hematoma (HCC)    small after fall 09/2016-plavix  held, neurosurgery consulted.  Asymptomatic.     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 PO Eliquis    Past Surgical History:  Procedure Laterality Date   ARM SKIN LESION BIOPSY / EXCISION Left 09/02/2015   CATARACT EXTRACTION W/ INTRAOCULAR LENS  IMPLANT, BILATERAL Bilateral    CHOLECYSTECTOMY OPEN     CORNEAL TRANSPLANT Bilateral    "one at [facility]; one at [facility]"   CORONARY ANGIOPLASTY WITH STENT PLACEMENT     CORONARY ARTERY BYPASS GRAFT  1999   DESCENDING AORTIC ANEURYSM REPAIR W/ STENT     DE stent ostium into the right radial free graft at OM-, 02-2007   ESOPHAGOGASTRODUODENOSCOPY N/A 11/09/2014   Procedure: ESOPHAGOGASTRODUODENOSCOPY (EGD);  Surgeon: Delilah Fend, MD;  Location: [facility] ENDOSCOPY;  Service: Endoscopy;  Laterality: N/A;   INGUINAL HERNIA REPAIR     JOINT REPLACEMENT     LEFT HEART CATH AND CORS/GRAFTS ANGIOGRAPHY N/A 06/27/2017   Procedure: LEFT HEART CATH AND CORS/GRAFTS ANGIOGRAPHY;  Surgeon: Millicent Ally, MD;  Location: [facility] INVASIVE CV LAB;  Service: Cardiovascular;  Laterality: N/A;   NASAL SINUS SURGERY     SHOULDER OPEN ROTATOR CUFF REPAIR Right    TOTAL KNEE ARTHROPLASTY Bilateral    Current Outpatient Medications on File Prior to Visit  Medication Sig Dispense Refill   acetaminophen  (TYLENOL ) 500 MG tablet Take 500 mg by mouth 2 (two) times daily as needed for mild pain, moderate pain, fever or headache.     apixaban  (ELIQUIS ) 2.5 MG TABS tablet Take 1 tablet (2.5 mg total) by mouth 2 (two) times daily. 180 tablet 3   budesonide -formoterol  (SYMBICORT ) 160-4.5 MCG/ACT inhaler Inhale 2 puffs into the lungs in the morning and at bedtime. 10.2 g 12    ciprofloxacin  (CILOXAN ) 0.3 % ophthalmic solution Place 2 drops into both eyes every 4 (four) hours while awake. Administer 1 drop, every 2 hours, while awake, for 2 days. Then 1 drop, every 4 hours, while awake, for the next 5 days. (Patient taking differently: Place 2 drops into both eyes every 4 (four) hours while awake. Patient is taking as needed.) 5 mL 0   erythromycin  ophthalmic ointment Place 1 Application into both eyes at bedtime. 3.5 g 4   finasteride  (PROSCAR ) 5 MG tablet Take 1 tablet (5 mg total) by mouth daily. 90 tablet 3   FLUoxetine  (PROZAC ) 20 MG capsule TAKE 1 CAPSULE EVERY DAY 90 capsule 3   folic acid  (FOLVITE ) 1 MG tablet Take 1 tablet (1 mg total) by mouth daily. 90 tablet 0   furosemide  (LASIX ) 40 MG tablet TAKE 1 TABLET EVERY DAY (Patient taking differently: Take 40 mg by mouth daily as needed for fluid or edema.) 90 tablet 3   Iron , Ferrous Sulfate , 325 (65 Fe) MG TABS Take 325 mg by mouth daily. (Patient taking differently: Take 325 mg by mouth daily. Patient is taking every other day) 30 tablet 3   metoprolol  succinate (TOPROL -XL) 25 MG 24 hr tablet Take 12.5 mg by mouth daily.     Multiple Vitamins-Minerals (MENS 50+ MULTIVITAMIN) TABS Take 1 tablet by mouth daily.     mupirocin  ointment (BACTROBAN ) 2 % Apply 1 Application topically 2 (two) times daily. 22 g 0   nitroGLYCERIN  (NITROSTAT ) 0.4 MG SL tablet Place 1 tablet (0.4 mg total) under the tongue every 5 (five) minutes as needed for chest pain. 50 tablet 3   nystatin  (MYCOSTATIN /NYSTOP ) powder Apply a thin layer topically to the affected area 2-3 (two to three) times daily. 15 g 0   pantoprazole  (PROTONIX ) 40 MG tablet TAKE 1 TABLET EVERY MORNING 90 tablet 0   rosuvastatin  (CRESTOR ) 5 MG tablet TAKE 1 TABLET EVERY DAY (Patient taking differently: Take 5 mg by mouth at bedtime.) 30 tablet 11   senna-docusate (STIMULANT LAXATIVE) 8.6-50 MG tablet Take 2 tablets by mouth 2 (two) times daily as needed for constipation 60  tablet 2   tamsulosin  (FLOMAX ) 0.4 MG CAPS capsule Take 1 capsule (0.4 mg total) by mouth daily with supper. (Patient taking differently: Take 0.4 mg by mouth at bedtime.) 90 capsule 3   Zinc  Oxide (TRIPLE PASTE) 12.8 % ointment Apply topically 4 (four) times daily as needed (skin barrier). (Patient taking differently: Apply 1 Application topically 2 (two) times daily as needed (skin barrier).) 56.7 g 0   No current facility-administered medications on file prior to visit.   Allergies  Allergen Reactions   Feldene [Piroxicam] Rash    Blistering rash     Neomycin -Polymyxin-Hc Itching   Statins Other (See Comments)    Myalgias   Latex Rash  Band Aid   Tequin [Gatifloxacin] Swelling and Anxiety   Valium [Diazepam] Other (See Comments)    Dizziness    Vibramycin  [Doxycycline ] Itching and Nausea Only   Social History   Socioeconomic History   Marital status: Widowed    Spouse name: Not on file   Number of children: 2   Years of education: 35   Highest education level: 12th grade  Occupational History   Not on file  Tobacco Use   Smoking status: Former    Types: Cigarettes    Passive exposure: Yes   Smokeless tobacco: Former    Types: Chew   Tobacco comments:    "quit chewing in the 1960s"    Verified by Daughter, Zula Hitch  Vaping Use   Vaping status: Never Used  Substance and Sexual Activity   Alcohol  use: No   Drug use: No   Sexual activity: Never  Other Topics Concern   Not on file  Social History Narrative   Not on file   Social Drivers of Health   Financial Resource Strain: Low Risk  (04/20/2023)   Overall Financial Resource Strain (CARDIA)    Difficulty of Paying Living Expenses: Not hard at all  Food Insecurity: No Food Insecurity (07/10/2023)   Hunger Vital Sign    Worried About Running Out of Food in the Last Year: Never true    Ran Out of Food in the Last Year: Never true  Transportation Needs: No Transportation Needs (07/10/2023)   PRAPARE -  Administrator, Civil Service (Medical): No    Lack of Transportation (Non-Medical): No  Physical Activity: Insufficiently Active (04/20/2023)   Exercise Vital Sign    Days of Exercise per Week: 5 days    Minutes of Exercise per Session: 10 min  Stress: No Stress Concern Present (04/20/2023)   [employer] of Occupational Health - Occupational Stress Questionnaire    Feeling of Stress : Not at all  Social Connections: Socially Isolated (04/20/2023)   Social Connection and Isolation Panel [NHANES]    Frequency of Communication with Friends and Family: Three times a week    Frequency of Social Gatherings with Friends and Family: Twice a week    Attends Religious Services: Never    Database administrator or Organizations: No    Attends Banker Meetings: Never    Marital Status: Widowed  Intimate Partner Violence: Not At Risk (07/10/2023)   Humiliation, Afraid, Rape, and Kick questionnaire    Fear of Current or Ex-Partner: No    Emotionally Abused: No    Physically Abused: No    Sexually Abused: No      Review of Systems     Objective:   Physical Exam Vitals reviewed.  Constitutional:      General: He is not in acute distress.    Appearance: Normal appearance. He is not toxic-appearing or diaphoretic.  Cardiovascular:     Rate and Rhythm: Normal rate and regular rhythm.     Pulses:          Dorsalis pedis pulses are 1+ on the right side and 1+ on the left side.     Heart sounds: Normal heart sounds. No murmur heard.    No friction rub. No gallop.  Pulmonary:     Effort: Pulmonary effort is normal. No respiratory distress.     Breath sounds: Examination of the right-middle field reveals rales. Examination of the right-lower field reveals rales. Examination of the left-lower field  reveals rales. Rales present. No wheezing or rhonchi.    Musculoskeletal:     Right lower leg: No edema.     Left lower leg: No edema.  Neurological:     Mental Status:  He is alert.   Right-sided hemiparesis with weakness in his right leg.  Weakness with knee flexion and extension and ankle flexion and extension.  Ataxia in the right upper arm and right lower leg.       Assessment & Plan:  Acute cough - Plan: Brain natriuretic peptide, CBC with Differential/Platelet, Comprehensive metabolic panel with GFR I am concerned the patient may be developing community-acquired pneumonia.  The other possibility would be pulmonary edema from congestive heart failure.  His baseline BNP is typically at near 600.  I will repeat a BNP today and if elevated, I will also increase Lasix  over the weekend.  Meanwhile cover community-acquired pneumonia with Augmentin  875 mg twice daily for 10 days as well as a Z-Pak.  Seek medical attention immediately if shortness of breath is worsening over the weekend.

## 2023-08-11 LAB — CBC WITH DIFFERENTIAL/PLATELET
Absolute Lymphocytes: 1222 {cells}/uL (ref 850–3900)
Absolute Monocytes: 631 {cells}/uL (ref 200–950)
Basophils Absolute: 52 {cells}/uL (ref 0–200)
Basophils Relative: 0.8 %
Eosinophils Absolute: 20 {cells}/uL (ref 15–500)
Eosinophils Relative: 0.3 %
HCT: 28.9 % — ABNORMAL LOW (ref 38.5–50.0)
Hemoglobin: 9.9 g/dL — ABNORMAL LOW (ref 13.2–17.1)
MCH: 33.8 pg — ABNORMAL HIGH (ref 27.0–33.0)
MCHC: 34.3 g/dL (ref 32.0–36.0)
MCV: 98.6 fL (ref 80.0–100.0)
MPV: 12 fL (ref 7.5–12.5)
Monocytes Relative: 9.7 %
Neutro Abs: 4576 {cells}/uL (ref 1500–7800)
Neutrophils Relative %: 70.4 %
Platelets: 108 10*3/uL — ABNORMAL LOW (ref 140–400)
RBC: 2.93 10*6/uL — ABNORMAL LOW (ref 4.20–5.80)
RDW: 13.1 % (ref 11.0–15.0)
Total Lymphocyte: 18.8 %
WBC: 6.5 10*3/uL (ref 3.8–10.8)

## 2023-08-11 LAB — COMPREHENSIVE METABOLIC PANEL WITH GFR
AG Ratio: 1.2 (calc) (ref 1.0–2.5)
ALT: 10 U/L (ref 9–46)
AST: 16 U/L (ref 10–35)
Albumin: 3.6 g/dL (ref 3.6–5.1)
Alkaline phosphatase (APISO): 100 U/L (ref 35–144)
BUN/Creatinine Ratio: 25 (calc) — ABNORMAL HIGH (ref 6–22)
BUN: 33 mg/dL — ABNORMAL HIGH (ref 7–25)
CO2: 18 mmol/L — ABNORMAL LOW (ref 20–32)
Calcium: 8.9 mg/dL (ref 8.6–10.3)
Chloride: 106 mmol/L (ref 98–110)
Creat: 1.33 mg/dL — ABNORMAL HIGH (ref 0.70–1.22)
Globulin: 3.1 g/dL (ref 1.9–3.7)
Glucose, Bld: 107 mg/dL — ABNORMAL HIGH (ref 65–99)
Potassium: 3.8 mmol/L (ref 3.5–5.3)
Sodium: 139 mmol/L (ref 135–146)
Total Bilirubin: 0.6 mg/dL (ref 0.2–1.2)
Total Protein: 6.7 g/dL (ref 6.1–8.1)
eGFR: 50 mL/min/{1.73_m2} — ABNORMAL LOW (ref >=60)

## 2023-08-11 LAB — BRAIN NATRIURETIC PEPTIDE: Brain Natriuretic Peptide: 802 pg/mL — ABNORMAL HIGH (ref <100)

## 2023-08-13 ENCOUNTER — Other Ambulatory Visit: Payer: Self-pay

## 2023-08-13 ENCOUNTER — Encounter (HOSPITAL_COMMUNITY): Payer: Self-pay | Admitting: Internal Medicine

## 2023-08-13 ENCOUNTER — Inpatient Hospital Stay (HOSPITAL_COMMUNITY)
Admission: EM | Admit: 2023-08-13 | Discharge: 2023-08-21 | DRG: 291 | Disposition: A | Discharge status: Home Health | Attending: Internal Medicine | Admitting: Internal Medicine

## 2023-08-13 ENCOUNTER — Emergency Department (HOSPITAL_COMMUNITY)

## 2023-08-13 DIAGNOSIS — F0394 Unspecified dementia, unspecified severity, with anxiety: Secondary | ICD-10-CM | POA: Diagnosis present

## 2023-08-13 DIAGNOSIS — J9601 Acute respiratory failure with hypoxia: Secondary | ICD-10-CM | POA: Diagnosis present

## 2023-08-13 DIAGNOSIS — G4700 Insomnia, unspecified: Secondary | ICD-10-CM | POA: Diagnosis not present

## 2023-08-13 DIAGNOSIS — G8929 Other chronic pain: Secondary | ICD-10-CM | POA: Diagnosis present

## 2023-08-13 DIAGNOSIS — I69351 Hemiplegia and hemiparesis following cerebral infarction affecting right dominant side: Secondary | ICD-10-CM | POA: Diagnosis not present

## 2023-08-13 DIAGNOSIS — E876 Hypokalemia: Secondary | ICD-10-CM | POA: Diagnosis not present

## 2023-08-13 DIAGNOSIS — Z951 Presence of aortocoronary bypass graft: Secondary | ICD-10-CM

## 2023-08-13 DIAGNOSIS — F0393 Unspecified dementia, unspecified severity, with mood disturbance: Secondary | ICD-10-CM | POA: Diagnosis present

## 2023-08-13 DIAGNOSIS — F0390 Unspecified dementia without behavioral disturbance: Secondary | ICD-10-CM | POA: Diagnosis not present

## 2023-08-13 DIAGNOSIS — I5033 Acute on chronic diastolic (congestive) heart failure: Secondary | ICD-10-CM | POA: Diagnosis present

## 2023-08-13 DIAGNOSIS — Z86718 Personal history of other venous thrombosis and embolism: Secondary | ICD-10-CM

## 2023-08-13 DIAGNOSIS — B9781 Human metapneumovirus as the cause of diseases classified elsewhere: Secondary | ICD-10-CM | POA: Diagnosis present

## 2023-08-13 DIAGNOSIS — J90 Pleural effusion, not elsewhere classified: Secondary | ICD-10-CM | POA: Diagnosis not present

## 2023-08-13 DIAGNOSIS — J438 Other emphysema: Secondary | ICD-10-CM

## 2023-08-13 DIAGNOSIS — K6389 Other specified diseases of intestine: Secondary | ICD-10-CM | POA: Diagnosis not present

## 2023-08-13 DIAGNOSIS — Z947 Corneal transplant status: Secondary | ICD-10-CM

## 2023-08-13 DIAGNOSIS — R918 Other nonspecific abnormal finding of lung field: Secondary | ICD-10-CM | POA: Diagnosis not present

## 2023-08-13 DIAGNOSIS — C3491 Malignant neoplasm of unspecified part of right bronchus or lung: Secondary | ICD-10-CM | POA: Diagnosis present

## 2023-08-13 DIAGNOSIS — I110 Hypertensive heart disease with heart failure: Secondary | ICD-10-CM | POA: Diagnosis not present

## 2023-08-13 DIAGNOSIS — Z79899 Other long term (current) drug therapy: Secondary | ICD-10-CM

## 2023-08-13 DIAGNOSIS — N400 Enlarged prostate without lower urinary tract symptoms: Secondary | ICD-10-CM | POA: Diagnosis present

## 2023-08-13 DIAGNOSIS — J189 Pneumonia, unspecified organism: Secondary | ICD-10-CM | POA: Diagnosis not present

## 2023-08-13 DIAGNOSIS — M069 Rheumatoid arthritis, unspecified: Secondary | ICD-10-CM | POA: Diagnosis present

## 2023-08-13 DIAGNOSIS — Z9049 Acquired absence of other specified parts of digestive tract: Secondary | ICD-10-CM

## 2023-08-13 DIAGNOSIS — E785 Hyperlipidemia, unspecified: Secondary | ICD-10-CM | POA: Diagnosis present

## 2023-08-13 DIAGNOSIS — F03918 Unspecified dementia, unspecified severity, with other behavioral disturbance: Secondary | ICD-10-CM | POA: Diagnosis present

## 2023-08-13 DIAGNOSIS — B9789 Other viral agents as the cause of diseases classified elsewhere: Secondary | ICD-10-CM | POA: Diagnosis present

## 2023-08-13 DIAGNOSIS — J690 Pneumonitis due to inhalation of food and vomit: Secondary | ICD-10-CM | POA: Diagnosis present

## 2023-08-13 DIAGNOSIS — Z978 Presence of other specified devices: Secondary | ICD-10-CM

## 2023-08-13 DIAGNOSIS — R509 Fever, unspecified: Secondary | ICD-10-CM | POA: Diagnosis present

## 2023-08-13 DIAGNOSIS — Z9842 Cataract extraction status, left eye: Secondary | ICD-10-CM

## 2023-08-13 DIAGNOSIS — D649 Anemia, unspecified: Secondary | ICD-10-CM | POA: Diagnosis present

## 2023-08-13 DIAGNOSIS — I272 Pulmonary hypertension, unspecified: Secondary | ICD-10-CM | POA: Diagnosis present

## 2023-08-13 DIAGNOSIS — Z87891 Personal history of nicotine dependence: Secondary | ICD-10-CM

## 2023-08-13 DIAGNOSIS — E43 Unspecified severe protein-calorie malnutrition: Secondary | ICD-10-CM | POA: Diagnosis present

## 2023-08-13 DIAGNOSIS — I509 Heart failure, unspecified: Secondary | ICD-10-CM | POA: Diagnosis not present

## 2023-08-13 DIAGNOSIS — I2510 Atherosclerotic heart disease of native coronary artery without angina pectoris: Secondary | ICD-10-CM | POA: Diagnosis present

## 2023-08-13 DIAGNOSIS — Z923 Personal history of irradiation: Secondary | ICD-10-CM

## 2023-08-13 DIAGNOSIS — N1832 Chronic kidney disease, stage 3b: Secondary | ICD-10-CM | POA: Diagnosis present

## 2023-08-13 DIAGNOSIS — I10 Essential (primary) hypertension: Secondary | ICD-10-CM | POA: Diagnosis not present

## 2023-08-13 DIAGNOSIS — Z86711 Personal history of pulmonary embolism: Secondary | ICD-10-CM | POA: Diagnosis present

## 2023-08-13 DIAGNOSIS — R64 Cachexia: Secondary | ICD-10-CM | POA: Diagnosis present

## 2023-08-13 DIAGNOSIS — L299 Pruritus, unspecified: Secondary | ICD-10-CM | POA: Diagnosis not present

## 2023-08-13 DIAGNOSIS — F32A Depression, unspecified: Secondary | ICD-10-CM | POA: Diagnosis present

## 2023-08-13 DIAGNOSIS — I69359 Hemiplegia and hemiparesis following cerebral infarction affecting unspecified side: Secondary | ICD-10-CM | POA: Diagnosis not present

## 2023-08-13 DIAGNOSIS — Z1152 Encounter for screening for COVID-19: Secondary | ICD-10-CM | POA: Diagnosis not present

## 2023-08-13 DIAGNOSIS — M159 Polyosteoarthritis, unspecified: Secondary | ICD-10-CM | POA: Diagnosis present

## 2023-08-13 DIAGNOSIS — D631 Anemia in chronic kidney disease: Secondary | ICD-10-CM | POA: Diagnosis present

## 2023-08-13 DIAGNOSIS — J1289 Other viral pneumonia: Secondary | ICD-10-CM | POA: Diagnosis present

## 2023-08-13 DIAGNOSIS — F339 Major depressive disorder, recurrent, unspecified: Secondary | ICD-10-CM | POA: Diagnosis not present

## 2023-08-13 DIAGNOSIS — Z9841 Cataract extraction status, right eye: Secondary | ICD-10-CM

## 2023-08-13 DIAGNOSIS — M545 Low back pain, unspecified: Secondary | ICD-10-CM | POA: Diagnosis present

## 2023-08-13 DIAGNOSIS — Z96653 Presence of artificial knee joint, bilateral: Secondary | ICD-10-CM | POA: Diagnosis present

## 2023-08-13 DIAGNOSIS — Z885 Allergy status to narcotic agent status: Secondary | ICD-10-CM

## 2023-08-13 DIAGNOSIS — Z8719 Personal history of other diseases of the digestive system: Secondary | ICD-10-CM

## 2023-08-13 DIAGNOSIS — Z961 Presence of intraocular lens: Secondary | ICD-10-CM | POA: Diagnosis present

## 2023-08-13 DIAGNOSIS — F411 Generalized anxiety disorder: Secondary | ICD-10-CM | POA: Diagnosis not present

## 2023-08-13 DIAGNOSIS — I13 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 failure and stage 1 through stage 4 chronic kidney disease, or unspecified chronic kidney disease: Principal | ICD-10-CM | POA: Diagnosis present

## 2023-08-13 DIAGNOSIS — I739 Peripheral vascular disease, unspecified: Secondary | ICD-10-CM | POA: Diagnosis present

## 2023-08-13 DIAGNOSIS — E871 Hypo-osmolality and hyponatremia: Secondary | ICD-10-CM | POA: Diagnosis not present

## 2023-08-13 DIAGNOSIS — Z888 Allergy status to other drugs, medicaments and biological substances status: Secondary | ICD-10-CM

## 2023-08-13 DIAGNOSIS — K591 Functional diarrhea: Secondary | ICD-10-CM | POA: Diagnosis not present

## 2023-08-13 DIAGNOSIS — Z955 Presence of coronary angioplasty implant and graft: Secondary | ICD-10-CM

## 2023-08-13 DIAGNOSIS — Z8701 Personal history of pneumonia (recurrent): Secondary | ICD-10-CM

## 2023-08-13 DIAGNOSIS — Z6823 Body mass index (BMI) 23.0-23.9, adult: Secondary | ICD-10-CM

## 2023-08-13 DIAGNOSIS — Z7951 Long term (current) use of inhaled steroids: Secondary | ICD-10-CM

## 2023-08-13 DIAGNOSIS — J449 Chronic obstructive pulmonary disease, unspecified: Secondary | ICD-10-CM | POA: Diagnosis present

## 2023-08-13 DIAGNOSIS — Z85118 Personal history of other malignant neoplasm of bronchus and lung: Secondary | ICD-10-CM

## 2023-08-13 DIAGNOSIS — R140 Abdominal distension (gaseous): Secondary | ICD-10-CM | POA: Diagnosis not present

## 2023-08-13 DIAGNOSIS — Z91040 Latex allergy status: Secondary | ICD-10-CM

## 2023-08-13 DIAGNOSIS — Z8249 Family history of ischemic heart disease and other diseases of the circulatory system: Secondary | ICD-10-CM

## 2023-08-13 DIAGNOSIS — K219 Gastro-esophageal reflux disease without esophagitis: Secondary | ICD-10-CM | POA: Diagnosis present

## 2023-08-13 DIAGNOSIS — H9190 Unspecified hearing loss, unspecified ear: Secondary | ICD-10-CM | POA: Diagnosis present

## 2023-08-13 DIAGNOSIS — Z881 Allergy status to other antibiotic agents status: Secondary | ICD-10-CM

## 2023-08-13 DIAGNOSIS — I444 Left anterior fascicular block: Secondary | ICD-10-CM | POA: Diagnosis present

## 2023-08-13 DIAGNOSIS — Z8261 Family history of arthritis: Secondary | ICD-10-CM

## 2023-08-13 DIAGNOSIS — I4891 Unspecified atrial fibrillation: Secondary | ICD-10-CM | POA: Diagnosis present

## 2023-08-13 DIAGNOSIS — Z7901 Long term (current) use of anticoagulants: Secondary | ICD-10-CM

## 2023-08-13 LAB — BASIC METABOLIC PANEL WITH GFR
Anion gap: 12 (ref 5–15)
BUN: 29 mg/dL — ABNORMAL HIGH (ref 8–23)
CO2: 19 mmol/L — ABNORMAL LOW (ref 22–32)
Calcium: 8.8 mg/dL — ABNORMAL LOW (ref 8.9–10.3)
Chloride: 103 mmol/L (ref 98–111)
Creatinine, Ser: 1.35 mg/dL — ABNORMAL HIGH (ref 0.61–1.24)
GFR, Estimated: 49 mL/min — ABNORMAL LOW (ref >60)
Glucose, Bld: 108 mg/dL — ABNORMAL HIGH (ref 70–99)
Potassium: 3.7 mmol/L (ref 3.5–5.1)
Sodium: 134 mmol/L — ABNORMAL LOW (ref 135–145)

## 2023-08-13 LAB — RESPIRATORY PANEL BY PCR

## 2023-08-13 LAB — BRAIN NATRIURETIC PEPTIDE: B Natriuretic Peptide: 587.6 pg/mL — ABNORMAL HIGH (ref 0.0–100.0)

## 2023-08-13 LAB — TROPONIN I (HIGH SENSITIVITY)
Troponin I (High Sensitivity): 21 ng/L — ABNORMAL HIGH (ref <18)
Troponin I (High Sensitivity): 23 ng/L — ABNORMAL HIGH (ref <18)

## 2023-08-13 LAB — CBC WITH DIFFERENTIAL/PLATELET
Abs Immature Granulocytes: 0.05 10*3/uL (ref 0.00–0.07)
Basophils Absolute: 0 10*3/uL (ref 0.0–0.1)
Basophils Relative: 1 %
Eosinophils Absolute: 0 10*3/uL (ref 0.0–0.5)
Eosinophils Relative: 0 %
HCT: 30.1 % — ABNORMAL LOW (ref 39.0–52.0)
Hemoglobin: 9.9 g/dL — ABNORMAL LOW (ref 13.0–17.0)
Immature Granulocytes: 1 %
Lymphocytes Relative: 14 %
Lymphs Abs: 0.9 10*3/uL (ref 0.7–4.0)
MCH: 34.4 pg — ABNORMAL HIGH (ref 26.0–34.0)
MCHC: 32.9 g/dL (ref 30.0–36.0)
MCV: 104.5 fL — ABNORMAL HIGH (ref 80.0–100.0)
Monocytes Absolute: 0.4 10*3/uL (ref 0.1–1.0)
Monocytes Relative: 6 %
Neutro Abs: 5.1 10*3/uL (ref 1.7–7.7)
Neutrophils Relative %: 78 %
Platelets: 87 10*3/uL — ABNORMAL LOW (ref 150–400)
RBC: 2.88 MIL/uL — ABNORMAL LOW (ref 4.22–5.81)
RDW: 14.3 % (ref 11.5–15.5)
Smear Review: DECREASED
WBC: 6.4 10*3/uL (ref 4.0–10.5)
nRBC: 0 % (ref 0.0–0.2)

## 2023-08-13 LAB — RESP PANEL BY RT-PCR (RSV, FLU A&B, COVID)  RVPGX2
Influenza A by PCR: NEGATIVE
Influenza B by PCR: NEGATIVE
Resp Syncytial Virus by PCR: NEGATIVE
SARS Coronavirus 2 by RT PCR: NEGATIVE

## 2023-08-13 LAB — PROCALCITONIN: Procalcitonin: <0.1 ng/mL

## 2023-08-13 LAB — MAGNESIUM: Magnesium: 1.8 mg/dL (ref 1.7–2.4)

## 2023-08-13 MED ORDER — METOPROLOL SUCCINATE ER 25 MG PO TB24
12.5000 mg | ORAL_TABLET | Freq: Every day | ORAL | Status: DC
  Administered 2023-08-14 – 2023-08-21 (×8): 12.5 mg via ORAL
  Filled 2023-08-13 (×8): qty 1

## 2023-08-13 MED ORDER — ACETAMINOPHEN 325 MG PO TABS
650.0000 mg | ORAL_TABLET | Freq: Four times a day (QID) | ORAL | Status: DC | PRN
  Administered 2023-08-14 – 2023-08-20 (×7): 650 mg via ORAL
  Filled 2023-08-13 (×7): qty 2

## 2023-08-13 MED ORDER — SODIUM CHLORIDE 0.9 % IV SOLN
500.0000 mg | INTRAVENOUS | Status: DC
  Administered 2023-08-14 – 2023-08-16 (×3): 500 mg via INTRAVENOUS
  Filled 2023-08-13 (×3): qty 5

## 2023-08-13 MED ORDER — ACETAMINOPHEN 650 MG RE SUPP: 650.0000 mg | Freq: Four times a day (QID) | RECTAL | Status: DC | PRN

## 2023-08-13 MED ORDER — TAMSULOSIN HCL 0.4 MG PO CAPS
0.4000 mg | ORAL_CAPSULE | Freq: Every day | ORAL | Status: DC
  Administered 2023-08-13 – 2023-08-20 (×8): 0.4 mg via ORAL
  Filled 2023-08-13 (×7): qty 1

## 2023-08-13 MED ORDER — NYSTATIN 100000 UNIT/GM EX POWD
1.0000 | Freq: Two times a day (BID) | CUTANEOUS | Status: DC
  Administered 2023-08-14 – 2023-08-21 (×15): 1 via TOPICAL
  Filled 2023-08-13: qty 15

## 2023-08-13 MED ORDER — ZINC OXIDE 12.8 % EX OINT: 1.0000 | TOPICAL_OINTMENT | Freq: Two times a day (BID) | CUTANEOUS | Status: DC | PRN

## 2023-08-13 MED ORDER — ACETAMINOPHEN 500 MG PO TABS
1000.0000 mg | ORAL_TABLET | Freq: Once | ORAL | Status: AC
  Administered 2023-08-13: 500 mg via ORAL
  Filled 2023-08-13: qty 2

## 2023-08-13 MED ORDER — MOMETASONE FURO-FORMOTEROL FUM 200-5 MCG/ACT IN AERO
2.0000 | INHALATION_SPRAY | Freq: Two times a day (BID) | RESPIRATORY_TRACT | Status: DC
  Administered 2023-08-13 – 2023-08-21 (×14): 2 via RESPIRATORY_TRACT
  Filled 2023-08-13: qty 8.8

## 2023-08-13 MED ORDER — SODIUM CHLORIDE 0.9 % IV SOLN
2.0000 g | INTRAVENOUS | Status: DC
  Administered 2023-08-14 – 2023-08-16 (×3): 2 g via INTRAVENOUS
  Filled 2023-08-13 (×3): qty 20

## 2023-08-13 MED ORDER — SODIUM CHLORIDE 0.9% FLUSH
3.0000 mL | Freq: Two times a day (BID) | INTRAVENOUS | Status: DC
  Administered 2023-08-13 – 2023-08-21 (×17): 3 mL via INTRAVENOUS

## 2023-08-13 MED ORDER — APIXABAN 2.5 MG PO TABS
2.5000 mg | ORAL_TABLET | Freq: Two times a day (BID) | ORAL | Status: DC
  Administered 2023-08-13 – 2023-08-21 (×16): 2.5 mg via ORAL
  Filled 2023-08-13 (×16): qty 1

## 2023-08-13 MED ORDER — FUROSEMIDE 10 MG/ML IJ SOLN
40.0000 mg | Freq: Once | INTRAMUSCULAR | Status: AC
  Administered 2023-08-13: 40 mg via INTRAVENOUS
  Filled 2023-08-13: qty 4

## 2023-08-13 MED ORDER — ROSUVASTATIN CALCIUM 5 MG PO TABS
5.0000 mg | ORAL_TABLET | Freq: Every day | ORAL | Status: DC
  Administered 2023-08-13 – 2023-08-20 (×8): 5 mg via ORAL
  Filled 2023-08-13 (×8): qty 1

## 2023-08-13 MED ORDER — SODIUM CHLORIDE 0.9 % IV SOLN
1.0000 g | Freq: Once | INTRAVENOUS | Status: AC
  Administered 2023-08-13: 1 g via INTRAVENOUS
  Filled 2023-08-13: qty 10

## 2023-08-13 MED ORDER — FLUOXETINE HCL 20 MG PO CAPS
20.0000 mg | ORAL_CAPSULE | Freq: Every day | ORAL | Status: DC
  Administered 2023-08-14 – 2023-08-21 (×8): 20 mg via ORAL
  Filled 2023-08-13 (×9): qty 1

## 2023-08-13 MED ORDER — SODIUM CHLORIDE 0.9 % IV SOLN
500.0000 mg | Freq: Once | INTRAVENOUS | Status: AC
  Administered 2023-08-13: 500 mg via INTRAVENOUS
  Filled 2023-08-13: qty 5

## 2023-08-13 MED ORDER — PANTOPRAZOLE SODIUM 40 MG PO TBEC
40.0000 mg | DELAYED_RELEASE_TABLET | Freq: Every morning | ORAL | Status: DC
  Administered 2023-08-14 – 2023-08-21 (×8): 40 mg via ORAL
  Filled 2023-08-13 (×8): qty 1

## 2023-08-13 NOTE — ED Provider Notes (Signed)
 Evan [name] EMERGENCY DEPARTMENT AT [facility] Provider Note  CSN: [id number] Arrival date & time: 08/13/23 1191  Chief Complaint(s) Fever  HPI Evan [name] is a 89 y.o. male with past medical history as below, significant for depression, ICH, CAD, DJD, GERD who presents to the ED with complaint of cough, fever  Has been having cough, body aches, elevated heart rate over the past week, he was seen by PCP on Thursday and started on antibiotics for presumed pneumonia with Augmentin  and azithromycin .  He had a fever this morning family brought him to the ER for evaluation.  He was given antipyretic prior to arrival and fever he has defervesced.  He has dementia so majority of history provided by family.  Did report increased to his cough over the past couple days after starting antibiotics.  Poor p.o. intake but tolerating liquids without difficulty, tolerating his regular medications.  No vomiting.  Concern for increased work of breathing, past couple days.  Past Medical History 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   Altered mental status 08/21/2018   Anginal chest pain at rest [facility])    Chronic non, controlled on Lorazepam    Anxiety    Arthritis    "all over"    BPH (benign prostatic hyperplasia)    CAD (coronary artery disease)    Cellulitis of arm, right 06/01/2018   Cellulitis of left axilla    Chronic lower back pain    Complication of anesthesia    "problems making water afterwards"   Depression    DJD (degenerative joint disease)    Esophageal ulcer without bleeding    bid ppi indefinitely   Fall 07/16/2018   Family history of adverse reaction to anesthesia    "children w/PONV"   GERD (gastroesophageal reflux disease)    Haemophilus  influenzae septicemia (HCC) 03/12/2023   Headache    "couple/week maybe" (09/04/2015)   Hemochromatosis    Possible, elevated iron  stores, hook-like osteophytes on hand films, normal LFTs   Hepatitis    "yellow jaundice as a baby"   History of ASCVD    MULTIVESSEL   Hyperlipidemia    Hypertension    Hypotension    Mild aortic stenosis 06/23/2021   Osteoarthritis    Peripheral vascular disease (HCC)    Pneumonia    "several times; got a little now" (09/04/2015)   Pulmonary embolism (HCC) 02/02/2018   Rash and nonspecific skin eruption 06/01/2018   Rheumatoid arthritis (HCC)    RSV (respiratory syncytial virus pneumonia) 05/15/2022   Subdural hematoma (HCC)    small after fall 09/2016-plavix  held, neurosurgery consulted.  Asymptomatic.     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 PO Eliquis    Patient Active Problem List   Diagnosis Date Noted   Acute metabolic acidosis 03/13/2023   Chronic indwelling Foley catheter 03/12/2023   PNA (pneumonia) 03/08/2023   Anemia 09/27/2022   Gastro-esophageal reflux disease without esophagitis 05/17/2022   Hyperlipidemia, unspecified 05/17/2022   Personal history of other venous thrombosis and embolism 05/17/2022   CKD stage 3b, GFR 30-44 ml/min (HCC) - baseline SCr 1.2-1.5 05/17/2022   Presence of coronary angioplasty implant and graft 05/17/2022   Malignant neoplasm of unspecified part of right bronchus or lung (HCC) 05/17/2022   Dementia without behavioral  disturbance (HCC) 05/17/2022   Acute on chronic diastolic CHF (congestive heart failure) (HCC) 05/12/2022   Protein-calorie malnutrition, severe 05/12/2022   Chronic respiratory failure, unsp w hypoxia or hypercapnia (HCC) 04/28/2022   History of pulmonary embolism 04/28/2022   Hemiparesis due to old stroke (HCC) 06/25/2021   Mild aortic stenosis 06/23/2021   Sundowning 07/04/2019   Bilateral hearing loss 04/16/2019   Closed wedge fracture of lumbar vertebra  (HCC) 03/14/2019   Pressure ulcer of back 08/27/2018   COPD (chronic obstructive pulmonary disease) (HCC) 08/26/2018   Prolonged Q-T interval on ECG    Dysphagia, post-stroke    History of lung cancer    Depression    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   Thrombocytopenia (HCC) 02/02/2018   Old MI (myocardial infarction) 06/27/2017   HFmrEF (heart failure with mildly reduced ejection fraction) (HCC) 06/26/2017   Oral candidiasis 05/15/2017   Radiation pneumonitis (HCC) 11/09/2016   Rheumatoid arthritis, unspecified (HCC) 10/11/2016   Subdural hematoma (HCC) 09/13/2016   Permanent atrial fibrillation (HCC) 05/21/2016   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   GAD (generalized anxiety disorder) 11/20/2015   Esophageal ulcer without bleeding    Bilateral lower extremity edema 04/28/2014   CAD (coronary artery disease) 09/09/2013   Benign prostatic hyperplasia without lower urinary tract symptoms 10/08/2012   EKG abnormalities 09/05/2012   Tremor 09/05/2012   Chronic fatigue 09/05/2012   Arthritis    Asthma, chronic    Reflux esophagitis    S/P CABG (coronary artery bypass graft)    Essential hypertension    UNSPECIFIED PERIPHERAL VASCULAR DISEASE 06/16/2009   Home Medication(s) Prior to Admission medications   Medication Sig Start Date End Date Taking? Authorizing Provider  acetaminophen  (TYLENOL ) 500 MG tablet Take 500 mg by mouth 2 (two) times daily as needed for mild pain, moderate pain, fever or headache.   Yes [provider]  amoxicillin -clavulanate (AUGMENTIN ) 875-125 MG tablet Take 1 tablet by mouth 2 (two) times daily. 08/10/23  Yes Austine Lefort, MD  apixaban  (ELIQUIS ) 2.5 MG TABS tablet Take 1 tablet (2.5 mg total) by mouth 2 (two) times daily. 10/14/22  Yes Gerald Kitty., NP  azithromycin  (ZITHROMAX ) 250 MG tablet 2 tabs poqday1, 1 tab poqday 2-5 08/10/23  Yes Austine Lefort, MD  budesonide -formoterol   (SYMBICORT ) 160-4.5 MCG/ACT inhaler Inhale 2 puffs into the lungs in the morning and at bedtime. 06/08/23  Yes Austine Lefort, MD  ciprofloxacin  (CILOXAN ) 0.3 % ophthalmic solution Place 2 drops into both eyes every 4 (four) hours while awake. Administer 1 drop, every 2 hours, while awake, for 2 days. Then 1 drop, every 4 hours, while awake, for the next 5 days. Patient taking differently: Place 2 drops into both eyes every 4 (four) hours while awake. Patient is taking as needed. 03/13/23  Yes Unk Garb, DO  FLUoxetine  (PROZAC ) 20 MG capsule TAKE 1 CAPSULE EVERY DAY 03/20/23  Yes Austine Lefort, MD  folic acid  (FOLVITE ) 1 MG tablet Take 1 tablet (1 mg total) by mouth daily. 07/13/23  Yes Austine Lefort, MD  furosemide  (LASIX ) 40 MG tablet TAKE 1 TABLET EVERY DAY 07/10/23  Yes Austine Lefort, MD  Iron , Ferrous Sulfate , 325 (65 Fe) MG TABS Take 325 mg by mouth daily. Patient taking differently: Take 325 mg by mouth daily. Patient is taking every other day 06/02/23  Yes Austine Lefort, MD  metoprolol  succinate (TOPROL -XL) 25 MG 24 hr tablet Take 12.5  mg by mouth daily.   Yes [provider]  Multiple Vitamins-Minerals (MENS 50+ MULTIVITAMIN) TABS Take 1 tablet by mouth daily.   Yes [provider]  mupirocin  ointment (BACTROBAN ) 2 % Apply 1 Application topically 2 (two) times daily. 07/24/23  Yes Hyatt, [name], DPM  nitroGLYCERIN  (NITROSTAT ) 0.4 MG SL tablet Place 1 tablet (0.4 mg total) under the tongue every 5 (five) minutes as needed for chest pain. 05/16/23  Yes Austine Lefort, MD  nystatin  (MYCOSTATIN /NYSTOP ) powder Apply a thin layer topically to the affected area 2-3 (two to three) times daily. 08/03/23  Yes   pantoprazole  (PROTONIX ) 40 MG tablet TAKE 1 TABLET EVERY MORNING 07/04/23  Yes Austine Lefort, MD  rosuvastatin  (CRESTOR ) 5 MG tablet TAKE 1 TABLET EVERY DAY Patient taking differently: Take 5 mg by mouth at bedtime. 10/24/22  Yes Lucendia Rusk, MD   senna-docusate (STIMULANT LAXATIVE) 8.6-50 MG tablet Take 2 tablets by mouth 2 (two) times daily as needed for constipation 07/21/22  Yes   tamsulosin  (FLOMAX ) 0.4 MG CAPS capsule Take 1 capsule (0.4 mg total) by mouth daily with supper. Patient taking differently: Take 0.4 mg by mouth at bedtime. 01/26/23  Yes Austine Lefort, MD  Zinc  Oxide (TRIPLE PASTE) 12.8 % ointment Apply topically 4 (four) times daily as needed (skin barrier). Patient taking differently: Apply 1 Application topically 2 (two) times daily as needed (skin barrier). 05/03/22  Yes Etter Hermann., MD  erythromycin  ophthalmic ointment Place 1 Application into both eyes at bedtime. Patient not taking: Reported on 08/13/2023 06/22/23   Austine Lefort, MD  finasteride  (PROSCAR ) 5 MG tablet Take 1 tablet (5 mg total) by mouth daily. Patient not taking: Reported on 08/13/2023 04/11/22   Austine Lefort, MD                                                                                                                                    Past Surgical History Past Surgical History:  Procedure Laterality Date   ARM SKIN LESION BIOPSY / EXCISION Left 09/02/2015   CATARACT EXTRACTION W/ INTRAOCULAR LENS  IMPLANT, BILATERAL Bilateral    CHOLECYSTECTOMY OPEN     CORNEAL TRANSPLANT Bilateral    "one at [facility]; one at [facility]"   CORONARY ANGIOPLASTY WITH STENT PLACEMENT     CORONARY ARTERY BYPASS GRAFT  1999   DESCENDING AORTIC ANEURYSM REPAIR W/ STENT     DE stent ostium into the right radial free graft at OM-, 02-2007   ESOPHAGOGASTRODUODENOSCOPY N/A 11/09/2014   Procedure: ESOPHAGOGASTRODUODENOSCOPY (EGD);  Surgeon: Delilah Fend, MD;  Location: [facility] ENDOSCOPY;  Service: Endoscopy;  Laterality: N/A;   INGUINAL HERNIA REPAIR     JOINT REPLACEMENT     LEFT HEART CATH AND CORS/GRAFTS ANGIOGRAPHY N/A 06/27/2017   Procedure: LEFT HEART CATH AND CORS/GRAFTS ANGIOGRAPHY;  Surgeon: Millicent Ally, MD;  Location: [facility] INVASIVE CV LAB;  Service:  Cardiovascular;  Laterality: N/A;   NASAL SINUS SURGERY     SHOULDER OPEN ROTATOR CUFF REPAIR Right    TOTAL KNEE ARTHROPLASTY Bilateral    Family History Family History  Problem Relation Age of Onset   Arthritis-Osteo Sister    Heart attack Brother    Heart attack Other    Cancer Neg Hx     Social History Social History   Tobacco Use   Smoking status: Former    Types: Cigarettes    Passive exposure: Yes   Smokeless tobacco: Former    Types: Chew   Tobacco comments:    "quit chewing in the 1960s"    Verified by Daughter, Zula Hitch  Vaping Use   Vaping status: Never Used  Substance Use Topics   Alcohol  use: No   Drug use: No   Allergies Feldene [piroxicam], Neomycin -polymyxin-hc, Statins, Latex, Tequin [gatifloxacin], Valium [diazepam], and Vibramycin  [doxycycline ]  Review of Systems A thorough review of systems was obtained and all systems are negative except as noted in the HPI and PMH.   Physical Exam Vital Signs  I have reviewed the triage vital signs BP 111/75   Pulse 91   Temp 98.2 F (36.8 C) (Oral)   Resp 18   Ht 5\' 1"  (1.549 m)   Wt 63.5 kg   SpO2 100%   BMI 26.45 kg/m  Physical Exam Vitals and nursing note reviewed.  Constitutional:      General: He is not in acute distress.    Appearance: He is well-developed.     Comments: Frail, hard of hearing  HENT:     Head: Normocephalic and atraumatic.     Right Ear: External ear normal.     Left Ear: External ear normal.     Mouth/Throat:     Mouth: Mucous membranes are moist.  Eyes:     General: No scleral icterus. Cardiovascular:     Rate and Rhythm: Regular rhythm. Tachycardia present.     Pulses: Normal pulses.     Heart sounds: Normal heart sounds.  Pulmonary:     Effort: Pulmonary effort is normal. No respiratory distress.     Breath sounds: Decreased breath sounds present.     Comments: Crackles noted Abdominal:     General: Abdomen is flat.     Palpations: Abdomen is soft.      Tenderness: There is no abdominal tenderness.  Musculoskeletal:     Cervical back: No rigidity.     Right lower leg: No edema.     Left lower leg: No edema.  Skin:    General: Skin is warm and dry.     Capillary Refill: Capillary refill takes less than 2 seconds.  Neurological:     Mental Status: He is alert.  Psychiatric:        Mood and Affect: Mood normal.        Behavior: Behavior normal.     ED Results and Treatments Labs (all labs ordered are listed, but only abnormal results are displayed) Labs Reviewed  BASIC METABOLIC PANEL WITH GFR - Abnormal; Notable for the following components:      Result Value   Sodium 134 (*)    CO2 19 (*)    Glucose, Bld 108 (*)    BUN 29 (*)    Creatinine, Ser 1.35 (*)    Calcium  8.8 (*)    GFR, Estimated 49 (*)    All other components within normal limits  CBC WITH DIFFERENTIAL/PLATELET - Abnormal;  Notable for the following components:   RBC 2.88 (*)    Hemoglobin 9.9 (*)    HCT 30.1 (*)    MCV 104.5 (*)    MCH 34.4 (*)    Platelets 87 (*)    All other components within normal limits  BRAIN NATRIURETIC PEPTIDE - Abnormal; Notable for the following components:   B Natriuretic Peptide 587.6 (*)    All other components within normal limits  TROPONIN I (HIGH SENSITIVITY) - Abnormal; Notable for the following components:   Troponin I (High Sensitivity) 21 (*)    All other components within normal limits  RESP PANEL BY RT-PCR (RSV, FLU A&B, COVID)  RVPGX2  TROPONIN I (HIGH SENSITIVITY)                                                                                                                          Radiology DG Chest Port 1 View Result Date: 08/13/2023 CLINICAL DATA:  fever cough EXAM: PORTABLE CHEST 1 VIEW COMPARISON:  March 11, 2023, September 27, 2022 FINDINGS: The cardiomediastinal silhouette is unchanged in contour.Status post median sternotomy. Small bilateral pleural effusions. No pneumothorax. There are patchy bibasilar  reticulonodular opacities, many of which are chronic in appearance. Gaseous distension of bowel in the upper abdomen. IMPRESSION: 1. Small bilateral pleural effusions. 2. Patchy bibasilar reticulonodular opacities, favored slightly increased since June 2024. Findings likely reflect a combination of aspiration, atelectasis or infection superimposed on a background of chronic scarring. 3. Gaseous distension of bowel. Electronically Signed   By: Clancy Crimes M.D.   On: 08/13/2023 11:05    Pertinent labs & imaging results that were available during my care of the patient were reviewed by me and considered in my medical decision making (see MDM for details).  Medications Ordered in ED Medications  cefTRIAXone  (ROCEPHIN ) 1 g in sodium chloride  0.9 % 100 mL IVPB (has no administration in time range)  azithromycin  (ZITHROMAX ) 500 mg in sodium chloride  0.9 % 250 mL IVPB (has no administration in time range)  furosemide  (LASIX ) injection 40 mg (40 mg Intravenous Given 08/13/23 1207)  acetaminophen  (TYLENOL ) tablet 1,000 mg (500 mg Oral Given 08/13/23 1440)                                                                                                                                     Procedures .Critical Care  Performed by: Martina Sledge,  Jola Nash, DO Authorized by: Teddi Favors, DO   Critical care provider statement:    Critical care time (minutes):  30   Critical care time was exclusive of:  Separately billable procedures and treating other patients   Critical care was necessary to treat or prevent imminent or life-threatening deterioration of the following conditions:  Cardiac failure   Critical care was time spent personally by me on the following activities:  Development of treatment plan with patient or surrogate, discussions with consultants, evaluation of patient's response to treatment, examination of patient, ordering and review of laboratory studies, ordering and review of radiographic studies,  ordering and performing treatments and interventions, pulse oximetry, re-evaluation of patient's condition, review of old charts and obtaining history from patient or surrogate   Care discussed with: admitting provider     (including critical care time)  Medical Decision Making / ED Course    Medical Decision Making:    JACEN MAHANNAH is a 89 y.o. male with past medical history as below, significant for depression, ICH, CAD, DJD, GERD who presents to the ED with complaint of cough, fever. The complaint involves an extensive differential diagnosis and also carries with it a high risk of complications and morbidity.  Serious etiology was considered. Ddx includes but is not limited to: Differential diagnosis for adult fever includes but is not exclusive to community-acquired pneumonia, urinary tract infection, acute cholecystitis, viral syndrome, cellulitis, tick bourne disease,  decubitus ulcer, necrotizing fasciitis, meningitis, encephalitis, influenza, etc.   Complete initial physical exam performed, notably the patient was in no acute distress, no hypoxia, tachycardia noted.    Reviewed and confirmed nursing documentation for past medical history, family history, social history.  Vital signs reviewed.     Brief summary:  89 year old male with history as above here with fever, recent pneumonia diagnosis.  Cough, some difficulty breathing. Accompanied by family who provide majority of history. Patient reports he feels okay, he denies chest pain, nausea or abdominal pain.  Feels he is breathing normally   Clinical Course as of 08/13/23 1540  Sun Aug 13, 2023  1133 B Natriuretic Peptide(!): 587.6 Pleural eff noted on CXR, no pedal edema, BNP elev, improved from recent outpatient labs last week. Will give lasix   [SG]  1133 Creatinine(!): 1.35 Baseline around 1.1, pt reports poor intake over last week 2/2 illness [SG]  1434 Troponin I (High Sensitivity)(!): 21 Similar to prior  [SG]  1434  Echo 12/24 LVEF 60 to 65% [SG]  1446 He has increased wob/ tachypnea, despite diuresis, plan admit for acute chf/pna [SG]    Clinical Course User Index [SG] Russella Courts A, DO     Recommend admission for CAP, CHF new oxygen  requirement.  Patient/  family agreeable             Additional history obtained: -Additional history obtained from family -External records from outside source obtained and reviewed including: Chart review including previous notes, labs, imaging, consultation notes including  Prior labs, home medications, prior imaging   Lab Tests: -I ordered, reviewed, and interpreted labs.   The pertinent results include:   Labs Reviewed  BASIC METABOLIC PANEL WITH GFR - Abnormal; Notable for the following components:      Result Value   Sodium 134 (*)    CO2 19 (*)    Glucose, Bld 108 (*)    BUN 29 (*)    Creatinine, Ser 1.35 (*)    Calcium  8.8 (*)    GFR, Estimated  49 (*)    All other components within normal limits  CBC WITH DIFFERENTIAL/PLATELET - Abnormal; Notable for the following components:   RBC 2.88 (*)    Hemoglobin 9.9 (*)    HCT 30.1 (*)    MCV 104.5 (*)    MCH 34.4 (*)    Platelets 87 (*)    All other components within normal limits  BRAIN NATRIURETIC PEPTIDE - Abnormal; Notable for the following components:   B Natriuretic Peptide 587.6 (*)    All other components within normal limits  TROPONIN I (HIGH SENSITIVITY) - Abnormal; Notable for the following components:   Troponin I (High Sensitivity) 21 (*)    All other components within normal limits  RESP PANEL BY RT-PCR (RSV, FLU A&B, COVID)  [id number]  TROPONIN I (HIGH SENSITIVITY)    Notable for trop/bnp elev  EKG   EKG Interpretation Date/Time:  Sunday Aug 13 2023 10:07:47 EDT Ventricular Rate:  109 PR Interval:    QRS Duration:  99 QT Interval:  358 QTC Calculation: 483 R Axis:   -17  Text Interpretation: Junctional tachycardia LVH with secondary repolarization abnormality  Borderline prolonged QT interval Confirmed by Russella Courts ([id number]) on 08/13/2023 10:18:06 AM         Imaging Studies ordered: I ordered imaging studies including chest x-ray I independently visualized the following imaging with scope of interpretation limited to determining acute life threatening conditions related to emergency care; findings noted above I agree with the radiologist interpretation If any imaging was obtained with contrast I closely monitored patient for any possible adverse reaction a/w contrast administration in the emergency department   Medicines ordered and prescription drug management: Meds ordered this encounter  Medications   furosemide  (LASIX ) injection 40 mg   acetaminophen  (TYLENOL ) tablet 1,000 mg   cefTRIAXone  (ROCEPHIN ) 1 g in sodium chloride  0.9 % 100 mL IVPB    Antibiotic Indication::   CAP   azithromycin  (ZITHROMAX ) 500 mg in sodium chloride  0.9 % 250 mL IVPB    -I have reviewed the patients home medicines and have made adjustments as needed   Consultations Obtained: I requested consultation with the trh,  and discussed lab and imaging findings as well as pertinent plan    Cardiac Monitoring: The patient was maintained on a cardiac monitor.  I personally viewed and interpreted the cardiac monitored which showed an underlying rhythm of: sinus tachy > nsr Continuous pulse oximetry interpreted by myself, 97% on RA.    Social Determinants of Health:  Diagnosis or treatment significantly limited by social determinants of health: former smoker   Reevaluation: After the interventions noted above, I reevaluated the patient and found that they have improved  Co morbidities that complicate the patient evaluation  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 07/14/2018   Adenocarcinoma of right  lung (HCC) 01/06/2016   Altered mental status 08/21/2018   Anginal chest pain at rest [facility])    Chronic non, controlled on Lorazepam    Anxiety    Arthritis    "all over"    BPH (benign prostatic hyperplasia)    CAD (coronary artery disease)    Cellulitis of arm, right 06/01/2018   Cellulitis of left axilla    Chronic lower back pain    Complication of anesthesia    "problems making water afterwards"   Depression    DJD (degenerative  joint disease)    Esophageal ulcer without bleeding    bid ppi indefinitely   Fall 07/16/2018   Family history of adverse reaction to anesthesia    "children w/PONV"   GERD (gastroesophageal reflux disease)    Haemophilus influenzae septicemia (HCC) 03/12/2023   Headache    "couple/week maybe" (09/04/2015)   Hemochromatosis    Possible, elevated iron  stores, hook-like osteophytes on hand films, normal LFTs   Hepatitis    "yellow jaundice as a baby"   History of ASCVD    MULTIVESSEL   Hyperlipidemia    Hypertension    Hypotension    Mild aortic stenosis 06/23/2021   Osteoarthritis    Peripheral vascular disease (HCC)    Pneumonia    "several times; got a little now" (09/04/2015)   Pulmonary embolism (HCC) 02/02/2018   Rash and nonspecific skin eruption 06/01/2018   Rheumatoid arthritis (HCC)    RSV (respiratory syncytial virus pneumonia) 05/15/2022   Subdural hematoma (HCC)    small after fall 09/2016-plavix  held, neurosurgery consulted.  Asymptomatic.     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 PO Eliquis       Dispostion: Disposition decision including need for hospitalization was considered, and patient admitted to the hospital.    Final Clinical Impression(s) / ED Diagnoses Final diagnoses:  Acute congestive heart failure, unspecified heart failure type [facility])  Community acquired pneumonia, unspecified laterality        Teddi Favors, DO 08/13/23 1540

## 2023-08-13 NOTE — H&P (Signed)
 History and Physical   Evan [name] UXL:[id number] DOB: [birth year] DOA: 08/13/2023  PCP: Austine Lefort, MD   Patient coming from: Home  Chief Complaint: Shortness of breath  HPI: Evan [name] is a 89 y.o. male with medical history significant of hypertension, CKD 3B, CVA, CAD status post CABG and stent, GERD, anemia, chronic diastolic CHF, anxiety, depression, dementia, lung cancer, BPH, COPD presenting with shortness of breath and recent pneumonia diagnosis.  History obtained with assistance of family and chart review due to patient's dementia.  Patient has had about a week of cough, body aches, tachycardia.  Was seen by his PCP 3 days ago and started on antibiotics for presumed pneumonia with Augmentin  and azithromycin .  Had recurrent fever today was brought to the ED.  Family at also noted a mild increased work of breathing last couple days.  Reportedly patient has had some decreased p.o. intake but has continued to tolerate p.o.  Patient unable to participate in detailed review of systems due to dementia.  But reports no complaints.  ED Course: Vital signs in the ED notable for blood pressure in the 100s-130 systolic, requiring 2 to 4 L to maintain saturations.  Lab workup included BMP with sodium 134, bicarb 19, BUN 29, creatinine stable at 1.35 from baseline of 1.2, glucose 108, calcium  8.8.  CBC with no leukocytosis, hemoglobin stable 9.9, platelets stable at 87.  BNP elevated to 587.  Troponin 21 with repeat pending.  Respiratory panel for flu COVID and RSV negative.  Chest x-ray showed small bilateral effusions, patchy opacities with slight increase likely representing atelectasis versus aspiration versus pneumonia on background of chronic scarring.  Patient received ceftriaxone , azithromycin , Lasix , Tylenol  in the ED.  Review of Systems: Patient unable to participate in detailed review of systems due to dementia.  But reports no complaints.  Past Medical History:  Diagnosis Date    Acute blood loss anemia    Acute bronchitis 05/15/2017   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 07/14/2018   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   Altered mental status 08/21/2018   Anginal chest pain at rest [facility])    Chronic non, controlled on Lorazepam    Anxiety    Arthritis    "all over"    BPH (benign prostatic hyperplasia)    CAD (coronary artery disease)    Cellulitis of arm, right 06/01/2018   Cellulitis of left axilla    Chronic lower back pain    Complication of anesthesia    "problems making water afterwards"   Depression    DJD (degenerative joint disease)    Esophageal ulcer without bleeding    bid ppi indefinitely   Fall 07/16/2018   Family history of adverse reaction to anesthesia    "children w/PONV"   GERD (gastroesophageal reflux disease)    Haemophilus influenzae septicemia (HCC) 03/12/2023   Headache    "couple/week maybe" (09/04/2015)   Hemochromatosis    Possible, elevated iron  stores, hook-like osteophytes on hand films, normal LFTs   Hepatitis    "yellow jaundice as a baby"   History of ASCVD    MULTIVESSEL   Hyperlipidemia    Hypertension    Hypotension    Mild aortic stenosis 06/23/2021   Osteoarthritis    Peripheral vascular disease (HCC)    Pneumonia    "several times; got a little now" (09/04/2015)   Pulmonary embolism (HCC) 02/02/2018   Rash and nonspecific skin eruption 06/01/2018   Rheumatoid arthritis (  HCC)    RSV (respiratory syncytial virus pneumonia) 05/15/2022   Subdural hematoma (HCC)    small after fall 09/2016-plavix  held, neurosurgery consulted.  Asymptomatic.     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 PO Eliquis     Past Surgical History:  Procedure Laterality Date   ARM SKIN LESION BIOPSY / EXCISION Left 09/02/2015   CATARACT EXTRACTION W/ INTRAOCULAR LENS  IMPLANT, BILATERAL Bilateral    CHOLECYSTECTOMY OPEN     CORNEAL  TRANSPLANT Bilateral    "one at [facility]; one at [facility]"   CORONARY ANGIOPLASTY WITH STENT PLACEMENT     CORONARY ARTERY BYPASS GRAFT  1999   DESCENDING AORTIC ANEURYSM REPAIR W/ STENT     DE stent ostium into the right radial free graft at OM-, 02-2007   ESOPHAGOGASTRODUODENOSCOPY N/A 11/09/2014   Procedure: ESOPHAGOGASTRODUODENOSCOPY (EGD);  Surgeon: Delilah Fend, MD;  Location: [facility] ENDOSCOPY;  Service: Endoscopy;  Laterality: N/A;   INGUINAL HERNIA REPAIR     JOINT REPLACEMENT     LEFT HEART CATH AND CORS/GRAFTS ANGIOGRAPHY N/A 06/27/2017   Procedure: LEFT HEART CATH AND CORS/GRAFTS ANGIOGRAPHY;  Surgeon: Millicent Ally, MD;  Location: [facility] INVASIVE CV LAB;  Service: Cardiovascular;  Laterality: N/A;   NASAL SINUS SURGERY     SHOULDER OPEN ROTATOR CUFF REPAIR Right    TOTAL KNEE ARTHROPLASTY Bilateral     Social History  reports that he has quit smoking. His smoking use included cigarettes. He has been exposed to tobacco smoke. He has quit using smokeless tobacco.  His smokeless tobacco use included chew. He reports that he does not drink alcohol  and does not use drugs.  Allergies  Allergen Reactions   Feldene [Piroxicam] Rash    Blistering rash     Neomycin -Polymyxin-Hc Itching   Statins Other (See Comments)    Myalgias   Latex Rash    Band Aid   Tequin [Gatifloxacin] Swelling and Anxiety   Valium [Diazepam] Other (See Comments)    Dizziness    Vibramycin  [Doxycycline ] Itching and Nausea Only    Family History  Problem Relation Age of Onset   Arthritis-Osteo Sister    Heart attack Brother    Heart attack Other    Cancer Neg Hx   Reviewed on admission  Prior to Admission medications   Medication Sig Start Date End Date Taking? Authorizing Provider  acetaminophen  (TYLENOL ) 500 MG tablet Take 500 mg by mouth 2 (two) times daily as needed for mild pain, moderate pain, fever or headache.   Yes [provider]  amoxicillin -clavulanate (AUGMENTIN ) 875-125 MG tablet Take 1  tablet by mouth 2 (two) times daily. 08/10/23  Yes Austine Lefort, MD  apixaban  (ELIQUIS ) 2.5 MG TABS tablet Take 1 tablet (2.5 mg total) by mouth 2 (two) times daily. 10/14/22  Yes Gerald Kitty., NP  azithromycin  (ZITHROMAX ) 250 MG tablet 2 tabs poqday1, 1 tab poqday 2-5 08/10/23  Yes Austine Lefort, MD  budesonide -formoterol  (SYMBICORT ) 160-4.5 MCG/ACT inhaler Inhale 2 puffs into the lungs in the morning and at bedtime. 06/08/23  Yes Austine Lefort, MD  ciprofloxacin  (CILOXAN ) 0.3 % ophthalmic solution Place 2 drops into both eyes every 4 (four) hours while awake. Administer 1 drop, every 2 hours, while awake, for 2 days. Then 1 drop, every 4 hours, while awake, for the next 5 days. Patient taking differently: Place 2 drops into both eyes every 4 (four) hours while awake. Patient is taking as needed. 03/13/23  Yes Farrel Hones,  Eric, DO  FLUoxetine  (PROZAC ) 20 MG capsule TAKE 1 CAPSULE EVERY DAY 03/20/23  Yes Austine Lefort, MD  folic acid  (FOLVITE ) 1 MG tablet Take 1 tablet (1 mg total) by mouth daily. 07/13/23  Yes Austine Lefort, MD  furosemide  (LASIX ) 40 MG tablet TAKE 1 TABLET EVERY DAY 07/10/23  Yes Austine Lefort, MD  Iron , Ferrous Sulfate , 325 (65 Fe) MG TABS Take 325 mg by mouth daily. Patient taking differently: Take 325 mg by mouth daily. Patient is taking every other day 06/02/23  Yes Austine Lefort, MD  metoprolol  succinate (TOPROL -XL) 25 MG 24 hr tablet Take 12.5 mg by mouth daily.   Yes [provider]  Multiple Vitamins-Minerals (MENS 50+ MULTIVITAMIN) TABS Take 1 tablet by mouth daily.   Yes [provider]  mupirocin  ointment (BACTROBAN ) 2 % Apply 1 Application topically 2 (two) times daily. 07/24/23  Yes Hyatt, [name], DPM  nitroGLYCERIN  (NITROSTAT ) 0.4 MG SL tablet Place 1 tablet (0.4 mg total) under the tongue every 5 (five) minutes as needed for chest pain. 05/16/23  Yes Austine Lefort, MD  nystatin  (MYCOSTATIN /NYSTOP ) powder Apply a thin layer topically  to the affected area 2-3 (two to three) times daily. 08/03/23  Yes   pantoprazole  (PROTONIX ) 40 MG tablet TAKE 1 TABLET EVERY MORNING 07/04/23  Yes Austine Lefort, MD  rosuvastatin  (CRESTOR ) 5 MG tablet TAKE 1 TABLET EVERY DAY Patient taking differently: Take 5 mg by mouth at bedtime. 10/24/22  Yes Lucendia Rusk, MD  senna-docusate (STIMULANT LAXATIVE) 8.6-50 MG tablet Take 2 tablets by mouth 2 (two) times daily as needed for constipation 07/21/22  Yes   tamsulosin  (FLOMAX ) 0.4 MG CAPS capsule Take 1 capsule (0.4 mg total) by mouth daily with supper. Patient taking differently: Take 0.4 mg by mouth at bedtime. 01/26/23  Yes Austine Lefort, MD  Zinc  Oxide (TRIPLE PASTE) 12.8 % ointment Apply topically 4 (four) times daily as needed (skin barrier). Patient taking differently: Apply 1 Application topically 2 (two) times daily as needed (skin barrier). 05/03/22  Yes Etter Hermann., MD  erythromycin  ophthalmic ointment Place 1 Application into both eyes at bedtime. Patient not taking: Reported on 08/13/2023 06/22/23   Austine Lefort, MD  finasteride  (PROSCAR ) 5 MG tablet Take 1 tablet (5 mg total) by mouth daily. Patient not taking: Reported on 08/13/2023 04/11/22   Austine Lefort, MD    Physical Exam: Vitals:   08/13/23 1345 08/13/23 1350 08/13/23 1356 08/13/23 1400  BP: 123/76   111/75  Pulse:  90  91  Resp:  18  18  Temp:   98.2 F (36.8 C)   TempSrc:   Oral   SpO2:  100%  100%  Weight:      Height:        Physical Exam Constitutional:      General: He is not in acute distress.    Appearance: Normal appearance. He is ill-appearing.  HENT:     Head: Normocephalic and atraumatic.     Mouth/Throat:     Mouth: Mucous membranes are moist.     Pharynx: Oropharynx is clear.  Eyes:     Extraocular Movements: Extraocular movements intact.     Pupils: Pupils are equal, round, and reactive to light.  Cardiovascular:     Rate and Rhythm: Regular rhythm. Tachycardia present.      Pulses: Normal pulses.     Heart sounds: Normal heart sounds.  Pulmonary:  Effort: Pulmonary effort is normal. No respiratory distress.     Breath sounds: Rales present.  Abdominal:     General: Bowel sounds are normal. There is no distension.     Palpations: Abdomen is soft.     Tenderness: There is no abdominal tenderness.  Musculoskeletal:        General: No swelling or deformity.  Skin:    General: Skin is warm and dry.  Neurological:     General: No focal deficit present.     Mental Status: Mental status is at baseline.    Labs on Admission: I have personally reviewed following labs and imaging studies  CBC: Recent Labs  Lab 08/10/23 1633 08/13/23 1020  WBC 6.5 6.4  NEUTROABS 4,576 5.1  HGB 9.9* 9.9*  HCT 28.9* 30.1*  MCV 98.6 104.5*  PLT 108* 87*    Basic Metabolic Panel: Recent Labs  Lab 08/10/23 1633 08/13/23 1020  NA 139 134*  K 3.8 3.7  CL 106 103  CO2 18* 19*  GLUCOSE 107* 108*  BUN 33* 29*  CREATININE 1.33* 1.35*  CALCIUM  8.9 8.8*    GFR: Estimated Creatinine Clearance: 27.5 mL/min (A) (by C-G formula based on SCr of 1.35 mg/dL (H)).  Liver Function Tests: Recent Labs  Lab 08/10/23 1633  AST 16  ALT 10  BILITOT 0.6  PROT 6.7    Urine analysis:    Component Value Date/Time   COLORURINE AMBER (A) 03/08/2023 1142   APPEARANCEUR CLOUDY (A) 03/08/2023 1142   LABSPEC 1.015 03/08/2023 1142   PHURINE 5.0 03/08/2023 1142   GLUCOSEU NEGATIVE 03/08/2023 1142   HGBUR MODERATE (A) 03/08/2023 1142   BILIRUBINUR NEGATIVE 03/08/2023 1142   KETONESUR NEGATIVE 03/08/2023 1142   PROTEINUR 100 (A) 03/08/2023 1142   UROBILINOGEN 0.2 07/13/2014 1804   NITRITE NEGATIVE 03/08/2023 1142   LEUKOCYTESUR LARGE (A) 03/08/2023 1142    Radiological Exams on Admission: DG Chest Port 1 View Result Date: 08/13/2023 CLINICAL DATA:  fever cough EXAM: PORTABLE CHEST 1 VIEW COMPARISON:  March 11, 2023, September 27, 2022 FINDINGS: The cardiomediastinal  silhouette is unchanged in contour.Status post median sternotomy. Small bilateral pleural effusions. No pneumothorax. There are patchy bibasilar reticulonodular opacities, many of which are chronic in appearance. Gaseous distension of bowel in the upper abdomen. IMPRESSION: 1. Small bilateral pleural effusions. 2. Patchy bibasilar reticulonodular opacities, favored slightly increased since June 2024. Findings likely reflect a combination of aspiration, atelectasis or infection superimposed on a background of chronic scarring. 3. Gaseous distension of bowel. Electronically Signed   By: Clancy Crimes M.D.   On: 08/13/2023 11:05   EKG: Independently reviewed.  Sinus rhythm versus junctional rhythm at 109 bpm.  LVH with repolarization abnormality.  Nonspecific T wave changes.  QTc borderline at 43.  Assessment/Plan Principal Problem:   Acute respiratory failure with hypoxia (HCC) Active Problems:   Essential hypertension   Benign prostatic hyperplasia without lower urinary tract symptoms   GAD (generalized anxiety disorder)   CKD stage 3b, GFR 30-44 ml/min (HCC) - baseline SCr 1.2-1.5   COPD (chronic obstructive pulmonary disease) (HCC)   Hemiparesis due to old stroke (HCC)   Dementia without behavioral disturbance (HCC)   Anemia   Chronic indwelling Foley catheter   Acute on chronic diastolic CHF (congestive heart failure) (HCC)   Adenocarcinoma of right lung (HCC)   CAD (coronary artery disease)   Depression   Acute respiratory failure with hypoxia Pneumonia Acute on chronic diastolic CHF > Had a week of cough  body aches and tachycardia and diagnosed with presumed pneumonia 3 days ago by PCP and started on Augmentin  and azithromycin . > Has had continued fevers and some increased work of breathing and was brought to the ED by family. > Found to be hypoxic requiring 2 to 4 L in the ED.  Imaging with possible pneumonia versus aspiration versus atelectasis on a background of chronic  scarring. > Also noted to have elevated BNP. > Respiratory failure likely combination of pneumonia and acute on chronic diastolic CHF. > No leukocytosis and negative for flu COVID RSV.  May represent partially treated pneumonia with initial 3 days of antibiotics versus alternative viral etiology.  Will check procalcitonin for respiratory viral panel. - Monitor in progressive unit overnight - Continue with supplemental oxygen , wean as tolerated For pneumonia - Continue with ceftriaxone  and azithromycin  - Procalcitonin - Full respiratory viral panel - Trend fever curve and WBC For acute on chronic diastolic CHF - Received 40 mg IV Lasix  in the ED.  Does not appear to be significantly volume up so we will hold off for repeat doses. - Repeat echo as last echo showed indeterminate diastolic function and normal systolic function and was 5 months ago. - Strict I's and O's, daily weights - Check magnesium  - Trend renal function and electrolytes  History of COPD versus asthma - Replace home Symbicort  with formulary Dulera   History of lung cancer status post radiation History of radiation pneumonitis - Noted  Hypertension - Lasix  as above - Continue metoprolol   Hyperlipidemia - Continue home rosuvastatin   GERD - Continue home PPI  CKD 3B > Creatinine stable in the ED at 1.35 - Trend renal function and electrolytes  History of CVA - Continue home Eliquis , rosuvastatin   History of atrial fibrillation - Continue home metoprolol , Eliquis   CAD > Status post CABG and stenting - Continue home metoprolol , rosuvastatin , Eliquis   History of QTc prolongation > Currently borderline at 483. - Recheck daily as he is receiving azithromycin  - Avoid other QTc prolonging medications when able  Anxiety Dementia Depression - Continue home fluoxetine   History of DVT/PE - Continue Eliquis    DVT prophylaxis: Eliquis  Code Status:   Full Family Communication:  Updated at bedside   Disposition Plan:   Patient is from:  Home  Anticipated DC to:  Home  Anticipated DC date:  1 to 3 days  Anticipated DC barriers: None  Consults called:  None Admission status:  Observation, progressive  Severity of Illness: The appropriate patient status for this patient is OBSERVATION. Observation status is judged to be reasonable and necessary in order to provide the required intensity of service to ensure the patient's safety. The patient's presenting symptoms, physical exam findings, and initial radiographic and laboratory data in the context of their medical condition is felt to place them at decreased risk for further clinical deterioration. Furthermore, it is anticipated that the patient will be medically stable for discharge from the hospital within 2 midnights of admission.    Johnetta Nab MD [facility]  How to contact the [facility] Attending or Consulting provider 7A - 7P or covering provider during after hours 7P -7A, for this patient?   Check the care team in [facility] and look for a) attending/consulting [facility] provider listed and b) the [facility] team listed Log into [email or web address] and use [facility]'s universal password to access. If you do not have the password, please contact the hospital operator. Locate the [facility] provider you are looking for under [facility] and page to  a number that you can be directly reached. If you still have difficulty reaching the provider, please page the [facility] (Director on Call) for the Hospitalists listed on amion for assistance.  08/13/2023, 3:44 PM

## 2023-08-13 NOTE — ED Notes (Addendum)
 Pt is otf with transport no new onset distress at this time.

## 2023-08-13 NOTE — ED Triage Notes (Signed)
 BIB POV from home, patient was recently diagnosed with pneumonia and has been on antibiotics. Patients family has been informed to bring patient if he runs a fever. Patients family states he head a fever of 100.8 and was given tylenol  around 0700 this morning.

## 2023-08-14 ENCOUNTER — Observation Stay (HOSPITAL_COMMUNITY)

## 2023-08-14 ENCOUNTER — Other Ambulatory Visit (HOSPITAL_COMMUNITY)

## 2023-08-14 ENCOUNTER — Other Ambulatory Visit: Payer: Self-pay | Admitting: Family Medicine

## 2023-08-14 ENCOUNTER — Other Ambulatory Visit: Payer: Self-pay | Admitting: Interventional Cardiology

## 2023-08-14 DIAGNOSIS — M069 Rheumatoid arthritis, unspecified: Secondary | ICD-10-CM | POA: Diagnosis present

## 2023-08-14 DIAGNOSIS — E43 Unspecified severe protein-calorie malnutrition: Secondary | ICD-10-CM | POA: Diagnosis present

## 2023-08-14 DIAGNOSIS — J449 Chronic obstructive pulmonary disease, unspecified: Secondary | ICD-10-CM | POA: Diagnosis present

## 2023-08-14 DIAGNOSIS — I5033 Acute on chronic diastolic (congestive) heart failure: Secondary | ICD-10-CM | POA: Diagnosis present

## 2023-08-14 DIAGNOSIS — F0390 Unspecified dementia without behavioral disturbance: Secondary | ICD-10-CM | POA: Diagnosis not present

## 2023-08-14 DIAGNOSIS — C3491 Malignant neoplasm of unspecified part of right bronchus or lung: Secondary | ICD-10-CM | POA: Diagnosis not present

## 2023-08-14 DIAGNOSIS — J9601 Acute respiratory failure with hypoxia: Secondary | ICD-10-CM

## 2023-08-14 DIAGNOSIS — R531 Weakness: Secondary | ICD-10-CM

## 2023-08-14 DIAGNOSIS — J690 Pneumonitis due to inhalation of food and vomit: Secondary | ICD-10-CM | POA: Diagnosis present

## 2023-08-14 DIAGNOSIS — F32A Depression, unspecified: Secondary | ICD-10-CM | POA: Diagnosis present

## 2023-08-14 DIAGNOSIS — F0394 Unspecified dementia, unspecified severity, with anxiety: Secondary | ICD-10-CM | POA: Diagnosis present

## 2023-08-14 DIAGNOSIS — F0393 Unspecified dementia, unspecified severity, with mood disturbance: Secondary | ICD-10-CM | POA: Diagnosis present

## 2023-08-14 DIAGNOSIS — J1289 Other viral pneumonia: Secondary | ICD-10-CM | POA: Diagnosis present

## 2023-08-14 DIAGNOSIS — E871 Hypo-osmolality and hyponatremia: Secondary | ICD-10-CM | POA: Diagnosis not present

## 2023-08-14 DIAGNOSIS — I4891 Unspecified atrial fibrillation: Secondary | ICD-10-CM | POA: Diagnosis present

## 2023-08-14 DIAGNOSIS — I13 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 failure and stage 1 through stage 4 chronic kidney disease, or unspecified chronic kidney disease: Secondary | ICD-10-CM | POA: Diagnosis present

## 2023-08-14 DIAGNOSIS — J189 Pneumonia, unspecified organism: Secondary | ICD-10-CM | POA: Diagnosis not present

## 2023-08-14 DIAGNOSIS — E876 Hypokalemia: Secondary | ICD-10-CM | POA: Diagnosis not present

## 2023-08-14 DIAGNOSIS — D631 Anemia in chronic kidney disease: Secondary | ICD-10-CM | POA: Diagnosis present

## 2023-08-14 DIAGNOSIS — I69351 Hemiplegia and hemiparesis following cerebral infarction affecting right dominant side: Secondary | ICD-10-CM | POA: Diagnosis not present

## 2023-08-14 DIAGNOSIS — N1832 Chronic kidney disease, stage 3b: Secondary | ICD-10-CM | POA: Diagnosis present

## 2023-08-14 DIAGNOSIS — I272 Pulmonary hypertension, unspecified: Secondary | ICD-10-CM | POA: Diagnosis present

## 2023-08-14 DIAGNOSIS — R64 Cachexia: Secondary | ICD-10-CM | POA: Diagnosis present

## 2023-08-14 DIAGNOSIS — R509 Fever, unspecified: Secondary | ICD-10-CM | POA: Diagnosis present

## 2023-08-14 DIAGNOSIS — D649 Anemia, unspecified: Secondary | ICD-10-CM

## 2023-08-14 DIAGNOSIS — I10 Essential (primary) hypertension: Secondary | ICD-10-CM | POA: Diagnosis not present

## 2023-08-14 DIAGNOSIS — F03918 Unspecified dementia, unspecified severity, with other behavioral disturbance: Secondary | ICD-10-CM | POA: Diagnosis present

## 2023-08-14 DIAGNOSIS — Z1152 Encounter for screening for COVID-19: Secondary | ICD-10-CM | POA: Diagnosis not present

## 2023-08-14 DIAGNOSIS — E785 Hyperlipidemia, unspecified: Secondary | ICD-10-CM | POA: Diagnosis present

## 2023-08-14 DIAGNOSIS — I2510 Atherosclerotic heart disease of native coronary artery without angina pectoris: Secondary | ICD-10-CM | POA: Diagnosis not present

## 2023-08-14 DIAGNOSIS — I739 Peripheral vascular disease, unspecified: Secondary | ICD-10-CM | POA: Diagnosis present

## 2023-08-14 LAB — COMPREHENSIVE METABOLIC PANEL WITH GFR
ALT: 10 U/L (ref 0–44)
AST: 25 U/L (ref 15–41)
Albumin: 2.9 g/dL — ABNORMAL LOW (ref 3.5–5.0)
Alkaline Phosphatase: 75 U/L (ref 38–126)
Anion gap: 8 (ref 5–15)
BUN: 23 mg/dL (ref 8–23)
CO2: 25 mmol/L (ref 22–32)
Calcium: 8.8 mg/dL — ABNORMAL LOW (ref 8.9–10.3)
Chloride: 101 mmol/L (ref 98–111)
Creatinine, Ser: 1.32 mg/dL — ABNORMAL HIGH (ref 0.61–1.24)
GFR, Estimated: 50 mL/min — ABNORMAL LOW (ref >60)
Glucose, Bld: 167 mg/dL — ABNORMAL HIGH (ref 70–99)
Potassium: 3.3 mmol/L — ABNORMAL LOW (ref 3.5–5.1)
Sodium: 134 mmol/L — ABNORMAL LOW (ref 135–145)
Total Bilirubin: 0.7 mg/dL (ref 0.0–1.2)
Total Protein: 6.6 g/dL (ref 6.5–8.1)

## 2023-08-14 LAB — CBC
HCT: 30.1 % — ABNORMAL LOW (ref 39.0–52.0)
Hemoglobin: 10 g/dL — ABNORMAL LOW (ref 13.0–17.0)
MCH: 34.1 pg — ABNORMAL HIGH (ref 26.0–34.0)
MCHC: 33.2 g/dL (ref 30.0–36.0)
MCV: 102.7 fL — ABNORMAL HIGH (ref 80.0–100.0)
Platelets: 79 10*3/uL — ABNORMAL LOW (ref 150–400)
RBC: 2.93 MIL/uL — ABNORMAL LOW (ref 4.22–5.81)
RDW: 14.3 % (ref 11.5–15.5)
WBC: 4.9 10*3/uL (ref 4.0–10.5)
nRBC: 0 % (ref 0.0–0.2)

## 2023-08-14 MED ORDER — CHLORHEXIDINE GLUCONATE CLOTH 2 % EX PADS
6.0000 | MEDICATED_PAD | Freq: Every day | CUTANEOUS | Status: DC
  Administered 2023-08-14 – 2023-08-21 (×7): 6 via TOPICAL

## 2023-08-14 MED ORDER — MAGNESIUM SULFATE 2 GM/50ML IV SOLN
2.0000 g | Freq: Once | INTRAVENOUS | Status: AC
  Administered 2023-08-14: 2 g via INTRAVENOUS
  Filled 2023-08-14: qty 50

## 2023-08-14 MED ORDER — POTASSIUM CHLORIDE CRYS ER 20 MEQ PO TBCR
40.0000 meq | EXTENDED_RELEASE_TABLET | Freq: Once | ORAL | Status: AC
  Administered 2023-08-14: 40 meq via ORAL
  Filled 2023-08-14: qty 2

## 2023-08-14 MED ORDER — FUROSEMIDE 10 MG/ML IJ SOLN
40.0000 mg | Freq: Once | INTRAMUSCULAR | Status: AC
  Administered 2023-08-14: 40 mg via INTRAVENOUS
  Filled 2023-08-14: qty 4

## 2023-08-14 NOTE — Plan of Care (Signed)
  Problem: Activity: Goal: Risk for activity intolerance will decrease Outcome: Progressing   Problem: Coping: Goal: Level of anxiety will decrease Outcome: Progressing   Problem: Pain Managment: Goal: General experience of comfort will improve and/or be controlled Outcome: Progressing   Problem: Safety: Goal: Ability to remain free from injury will improve Outcome: Progressing

## 2023-08-14 NOTE — Progress Notes (Signed)
 Heart Failure Navigator Progress Note  Assessed for Heart & Vascular TOC clinic readiness.  Patient does not meet criteria due to history of dementia.   Navigator will sign off.  Sharen Hones, PharmD, BCPS Heart Failure Stewardship Pharmacist Phone [phone]

## 2023-08-14 NOTE — Progress Notes (Signed)
 PROGRESS NOTE    Evan [name]  UYQ:[id number] DOB: [birth year] DOA: 08/13/2023 PCP: Austine Lefort, MD  89/M hypertension, CKD 3B, CVA, CAD status post CABG and stent, GERD, anemia, chronic diastolic CHF, anxiety, depression, dementia, lung cancer, BPH, COPD presenting with shortness of breath and recent pneumonia diagnosis. - had about a week of cough, body aches, tachycardia.  Was seen by his PCP 3 days ago and started on Augmentin  and azithromycin , had recurrence of fevers with increased work of breathing. - In the ER tachycardic, mildly hypoxic, creatinine 1.3, hemoglobin 9.9, no leukocytosis, BNP 587, troponin 21, chest x-ray with small bilateral effusions, patchy opacities in the background of chronic scarring  Subjective: -Sleepy, tired this morning, confused, limited historian  Assessment and Plan:  Acute respiratory failure with hypoxia - Suspect combination of pneumonia primarily possibly aspiration and some volume overload - Management as below  Bilateral pneumonia, suspect aspiration -Presenting with cough, myalgias, fevers -Continue ceftriaxone  and azithromycin  -Positive metapneumovirus as well -Check SLP eval -Wean O2  Acute on chronic diastolic CHF - Lasix  40 Mg x 1, does not appear significantly volume overloaded at this time, continue metoprolol  -Last echo 12/24 with preserved EF, normal RV, moderately elevated PA systolic pressures - Conservative management on account of advanced age, frailty will not repeat echo   History of COPD versus asthma - Replace home Symbicort  with formulary Dulera    History of lung cancer status post radiation History of radiation pneumonitis - Noted   Hypertension - Lasix  as above - Continue metoprolol    Hyperlipidemia - Continue home rosuvastatin    GERD - Continue home PPI   CKD 3B > Creatinine stable in the ED at 1.35 - Trend renal function and electrolytes   History of CVA - Continue home Eliquis , rosuvastatin     History of atrial fibrillation - Continue home metoprolol , Eliquis    CAD > Status post CABG and stenting - Continue home metoprolol , rosuvastatin , Eliquis    History of QTc prolongation -Monitor   Dementia Depression -At risk of delirium and sundowning - Continue home fluoxetine    History of DVT/PE - Continue Eliquis     DVT prophylaxis:      Eliquis  Code Status:              Full> discussed with son and daughter, suggested consideration of DNR Family Communication:       Updated at bedside  Disposition Plan: Home with family likely 48 hours  Objective: Vitals:   08/13/23 2111 08/14/23 0049 08/14/23 0535 08/14/23 0839  BP: 114/69 114/61 110/64   Pulse: 95 91 99   Resp: 18 (!) 21 (!) 22   Temp: 99.1 F (37.3 C) 98.2 F (36.8 C) 97.9 F (36.6 C)   TempSrc: Oral Oral Oral   SpO2: 95% 97% 98% 98%  Weight:   65 kg   Height:        Intake/Output Summary (Last 24 hours) at 08/14/2023 0925 Last data filed at 08/14/2023 0536 Gross per 24 hour  Intake --  Output 1500 ml  Net -1500 ml   Filed Weights   08/13/23 1000 08/13/23 1800 08/14/23 0535  Weight: 63.5 kg 64.5 kg 65 kg    Examination:  General exam: Chronically ill elderly cachectic male laying in bed, somnolent arousable, oriented to self only HEENT: Positive JVD CVS: S1-S2, irregular rhythm Lungs: Decreased breath sounds at the bases, poor air movement Abdomen: Soft, nontender, bowel sounds present Extremities: No edema  Skin: No rashes Psychiatry: Flat affect, confused  Data Reviewed:   CBC: Recent Labs  Lab 08/10/23 1633 08/13/23 1020  WBC 6.5 6.4  NEUTROABS 4,576 5.1  HGB 9.9* 9.9*  HCT 28.9* 30.1*  MCV 98.6 104.5*  PLT 108* 87*   Basic Metabolic Panel: Recent Labs  Lab 08/10/23 1633 08/13/23 1020 08/13/23 1821  NA 139 134*  --   K 3.8 3.7  --   CL 106 103  --   CO2 18* 19*  --   GLUCOSE 107* 108*  --   BUN 33* 29*  --   CREATININE 1.33* 1.35*  --   CALCIUM  8.9 8.8*  --   MG   --   --  1.8   GFR: Estimated Creatinine Clearance: 26.4 mL/min (A) (by C-G formula based on SCr of 1.35 mg/dL (H)). Liver Function Tests: Recent Labs  Lab 08/10/23 1633  AST 16  ALT 10  BILITOT 0.6  PROT 6.7   No results for input(s): "LIPASE", "AMYLASE" in the last 168 hours. No results for input(s): "AMMONIA" in the last 168 hours. Coagulation Profile: No results for input(s): "INR", "PROTIME" in the last 168 hours. Cardiac Enzymes: No results for input(s): "CKTOTAL", "CKMB", "CKMBINDEX", "TROPONINI" in the last 168 hours. BNP (last 3 results) No results for input(s): "PROBNP" in the last 8760 hours. HbA1C: No results for input(s): "HGBA1C" in the last 72 hours. CBG: No results for input(s): "GLUCAP" in the last 168 hours. Lipid Profile: No results for input(s): "CHOL", "HDL", "LDLCALC", "TRIG", "CHOLHDL", "LDLDIRECT" in the last 72 hours. Thyroid  Function Tests: No results for input(s): "TSH", "T4TOTAL", "FREET4", "T3FREE", "THYROIDAB" in the last 72 hours. Anemia Panel: No results for input(s): "VITAMINB12", "FOLATE", "FERRITIN", "TIBC", "IRON ", "RETICCTPCT" in the last 72 hours. Urine analysis:    Component Value Date/Time   COLORURINE AMBER (A) 03/08/2023 1142   APPEARANCEUR CLOUDY (A) 03/08/2023 1142   LABSPEC 1.015 03/08/2023 1142   PHURINE 5.0 03/08/2023 1142   GLUCOSEU NEGATIVE 03/08/2023 1142   HGBUR MODERATE (A) 03/08/2023 1142   BILIRUBINUR NEGATIVE 03/08/2023 1142   KETONESUR NEGATIVE 03/08/2023 1142   PROTEINUR 100 (A) 03/08/2023 1142   UROBILINOGEN 0.2 07/13/2014 1804   NITRITE NEGATIVE 03/08/2023 1142   LEUKOCYTESUR LARGE (A) 03/08/2023 1142   Sepsis Labs: @LABRCNTIP (procalcitonin:4,lacticidven:4)  ) Recent Results (from the past 240 hours)  Resp panel by RT-PCR (RSV, Flu A&B, Covid) Anterior Nasal Swab     Status: None   Collection Time: 08/13/23  9:58 AM   Specimen: Anterior Nasal Swab  Result Value Ref Range Status   SARS Coronavirus 2 by  RT PCR NEGATIVE NEGATIVE Final   Influenza A by PCR NEGATIVE NEGATIVE Final   Influenza B by PCR NEGATIVE NEGATIVE Final    Comment: (NOTE) The Xpert Xpress SARS-CoV-2/FLU/RSV plus assay is intended as an aid in the diagnosis of influenza from Nasopharyngeal swab specimens and should not be used as a sole basis for treatment. Nasal washings and aspirates are unacceptable for Xpert Xpress SARS-CoV-2/FLU/RSV testing.  Fact Sheet for Patients: BloggerCourse.com  Fact Sheet for Healthcare Providers: SeriousBroker.it  This test is not yet approved or cleared by the [place]  FDA and has been authorized for detection and/or diagnosis of SARS-CoV-2 by FDA under an Emergency Use Authorization (EUA). This EUA will remain in effect (meaning this test can be used) for the duration of the COVID-19 declaration under Section 564(b)(1) of the Act, 21 U.S.C. section 360bbb-3(b)(1), unless the authorization is terminated or revoked.     Resp Syncytial Virus by PCR  NEGATIVE NEGATIVE Final    Comment: (NOTE) Fact Sheet for Patients: BloggerCourse.com  Fact Sheet for Healthcare Providers: SeriousBroker.it  This test is not yet approved or cleared by the [place]  FDA and has been authorized for detection and/or diagnosis of SARS-CoV-2 by FDA under an Emergency Use Authorization (EUA). This EUA will remain in effect (meaning this test can be used) for the duration of the COVID-19 declaration under Section 564(b)(1) of the Act, 21 U.S.C. section 360bbb-3(b)(1), unless the authorization is terminated or revoked.  Performed at [facility], [address] [place], [place] [address]   Respiratory (~20 pathogens) panel by PCR     Status: Abnormal   Collection Time: 08/13/23  5:26 PM   Specimen: Nasopharyngeal Swab; Respiratory  Result Value Ref Range Status   Adenovirus NOT DETECTED  NOT DETECTED Final   Coronavirus 229E NOT DETECTED NOT DETECTED Final    Comment: (NOTE) The Coronavirus on the Respiratory Panel, DOES NOT test for the novel  Coronavirus (2019 nCoV)    Coronavirus HKU1 NOT DETECTED NOT DETECTED Final   Coronavirus NL63 NOT DETECTED NOT DETECTED Final   Coronavirus OC43 NOT DETECTED NOT DETECTED Final   Metapneumovirus DETECTED (A) NOT DETECTED Final   Rhinovirus / Enterovirus NOT DETECTED NOT DETECTED Final   Influenza A NOT DETECTED NOT DETECTED Final   Influenza B NOT DETECTED NOT DETECTED Final   Parainfluenza Virus 1 NOT DETECTED NOT DETECTED Final   Parainfluenza Virus 2 NOT DETECTED NOT DETECTED Final   Parainfluenza Virus 3 NOT DETECTED NOT DETECTED Final   Parainfluenza Virus 4 NOT DETECTED NOT DETECTED Final   Respiratory Syncytial Virus NOT DETECTED NOT DETECTED Final   Bordetella pertussis NOT DETECTED NOT DETECTED Final   Bordetella Parapertussis NOT DETECTED NOT DETECTED Final   Chlamydophila pneumoniae NOT DETECTED NOT DETECTED Final   Mycoplasma pneumoniae NOT DETECTED NOT DETECTED Final    Comment: Performed at [facility], [address], [place], [place] [address]     Radiology Studies: DG Chest Port 1 View Result Date: 08/13/2023 CLINICAL DATA:  fever cough EXAM: PORTABLE CHEST 1 VIEW COMPARISON:  March 11, 2023, September 27, 2022 FINDINGS: The cardiomediastinal silhouette is unchanged in contour.Status post median sternotomy. Small bilateral pleural effusions. No pneumothorax. There are patchy bibasilar reticulonodular opacities, many of which are chronic in appearance. Gaseous distension of bowel in the upper abdomen. IMPRESSION: 1. Small bilateral pleural effusions. 2. Patchy bibasilar reticulonodular opacities, favored slightly increased since June 2024. Findings likely reflect a combination of aspiration, atelectasis or infection superimposed on a background of chronic scarring. 3. Gaseous distension of bowel. Electronically  Signed   By: Clancy Crimes M.D.   On: 08/13/2023 11:05     Scheduled Meds:  apixaban   2.5 mg Oral BID   Chlorhexidine  Gluconate Cloth  6 each Topical Q0600   FLUoxetine   20 mg Oral Daily   metoprolol  succinate  12.5 mg Oral Daily   mometasone -formoterol   2 puff Inhalation BID   nystatin   1 Application Topical BID   pantoprazole   40 mg Oral q morning   rosuvastatin   5 mg Oral QHS   sodium chloride  flush  3 mL Intravenous Q12H   tamsulosin   0.4 mg Oral QHS   Continuous Infusions:  azithromycin      cefTRIAXone  (ROCEPHIN )  IV       LOS: 0 days    Time spent:    Deforest Fast, MD [facility]   08/14/2023, 9:25 AM

## 2023-08-14 NOTE — Progress Notes (Signed)
 Modified Barium Swallow Study  Patient Details  Name: Evan [name] MRN: [id number] Date of Birth: 07/15/1927  Today's Date: 08/14/2023  Modified Barium Swallow completed.  Full report located under Chart Review in the Imaging Section.  History of Present Illness VIRGIN MAKOWSKI is a 89 y.o. male who presented with shortness of breath and recent pneumonia diagnosis. CXR 5/11: "Patchy bibasilar reticulonodular opacities, favored slightly  increased since June 2024. Findings likely reflect a combination of  aspiration, atelectasis or infection superimposed on a background of  chronic scarring."  Pt known to this service from prior admission.  MBSS in Dec largely WFL with pna at that time, but had sick contacts.   MBSS in 2023 and 2020 as well all with recs to cont regular/thin, but with some precuations/compensatory strategies. Pt with medical history significant of hypertension, CKD 3B, CVA, CAD status post CABG and stent, GERD, anemia, chronic diastolic CHF, anxiety, depression, dementia, lung cancer, BPH, COPD.   Clinical Impression Patient presents with a mild oropharyngeal dysphagia characterized by midlly prolonged oral transit of bolus with regular solids however remains functional. Largest deficit is a delay in swallow initiation to the pyriform sinuses which result in deep penetration and aspiration of thin liquids which is not consistently sensed. Trials of nectar thick liquid significantly increased airway protection wtih only subtle penetration noted with large bolus via straw. Laryngeal/pharyngeal strength intact. Appearance of a shadow, question calcification along posterior pharyngeal wall, above UES, moves partially with movement of other swallowing structures and does not appear to be impacting swallowing function overall, defer further w/u to MD. At this time, recommend regular solids with nectar thick liquids. SLP will f/u. Factors that may increase risk of adverse event in presence of  aspiration Roderick Civatte & Jessy Morocco 2021): Respiratory or GI disease  Swallow Evaluation Recommendations Recommendations: PO diet PO Diet Recommendation: Regular;Mildly thick liquids (Level 2, nectar thick) Liquid Administration via: Cup;No straw Medication Administration: Whole meds with puree Supervision: Patient able to self-feed;Full supervision/cueing for swallowing strategies Swallowing strategies  : Slow rate;Small bites/sips Postural changes: Position pt fully upright for meals Oral care recommendations: Oral care BID (2x/day)    Avanelle Pixley MA, CCC-SLP   Danon Lograsso Meryl 08/14/2023,1:51 PM

## 2023-08-14 NOTE — Evaluation (Signed)
 Clinical/Bedside Swallow Evaluation Patient Details  Name: Evan [name] MRN: [id number] Date of Birth: [birth year]  Today's Date: 08/14/2023 Time: SLP Start Time (ACUTE ONLY): 0931 SLP Stop Time (ACUTE ONLY): 0941 SLP Time Calculation (min) (ACUTE ONLY): 10 min  Past Medical History:  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   Altered mental status 08/21/2018   Anginal chest pain at rest [facility])    Chronic non, controlled on Lorazepam    Anxiety    Arthritis    "all over"    BPH (benign prostatic hyperplasia)    CAD (coronary artery disease)    Cellulitis of arm, right 06/01/2018   Cellulitis of left axilla    Chronic lower back pain    Complication of anesthesia    "problems making water afterwards"   Depression    DJD (degenerative joint disease)    Esophageal ulcer without bleeding    bid ppi indefinitely   Fall 07/16/2018   Family history of adverse reaction to anesthesia    "children w/PONV"   GERD (gastroesophageal reflux disease)    Haemophilus influenzae septicemia (HCC) 03/12/2023   Headache    "couple/week maybe" (09/04/2015)   Hemochromatosis    Possible, elevated iron  stores, hook-like osteophytes on hand films, normal LFTs   Hepatitis    "yellow jaundice as a baby"   History of ASCVD    MULTIVESSEL   Hyperlipidemia    Hypertension    Hypotension    Mild aortic stenosis 06/23/2021   Osteoarthritis    Peripheral vascular disease (HCC)    Pneumonia    "several times; got a little now" (09/04/2015)   Pulmonary embolism (HCC) 02/02/2018   Rash and nonspecific skin eruption 06/01/2018   Rheumatoid arthritis (HCC)    RSV (respiratory syncytial virus pneumonia) 05/15/2022   Subdural hematoma (HCC)    small after fall 09/2016-plavix  held,  neurosurgery consulted.  Asymptomatic.     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 PO Eliquis    Past Surgical History:  Past Surgical History:  Procedure Laterality Date   ARM SKIN LESION BIOPSY / EXCISION Left 09/02/2015   CATARACT EXTRACTION W/ INTRAOCULAR LENS  IMPLANT, BILATERAL Bilateral    CHOLECYSTECTOMY OPEN     CORNEAL TRANSPLANT Bilateral    "one at [name]; one at [facility]"   CORONARY ANGIOPLASTY WITH STENT PLACEMENT     CORONARY ARTERY BYPASS GRAFT  1999   DESCENDING AORTIC ANEURYSM REPAIR W/ STENT     DE stent ostium into the right radial free graft at OM-, 02-2007   ESOPHAGOGASTRODUODENOSCOPY N/A 11/09/2014   Procedure: ESOPHAGOGASTRODUODENOSCOPY (EGD);  Surgeon: Delilah Fend, MD;  Location: [facility] ENDOSCOPY;  Service: Endoscopy;  Laterality: N/A;   INGUINAL HERNIA REPAIR     JOINT REPLACEMENT     LEFT HEART CATH AND CORS/GRAFTS ANGIOGRAPHY N/A 06/27/2017   Procedure: LEFT HEART CATH AND CORS/GRAFTS ANGIOGRAPHY;  Surgeon: Millicent Ally, MD;  Location: [facility] INVASIVE CV LAB;  Service: Cardiovascular;  Laterality: N/A;   NASAL SINUS SURGERY     SHOULDER OPEN ROTATOR CUFF REPAIR Right    TOTAL KNEE ARTHROPLASTY Bilateral    HPI:  Evan [name] is a 89 y.o. male who presented with shortness of breath and recent pneumonia diagnosis. CXR 5/11: "Patchy bibasilar reticulonodular opacities, favored slightly  increased  since June 2024. Findings likely reflect a combination of  aspiration, atelectasis or infection superimposed on a background of  chronic scarring."  Pt known to this service from prior admission.  MBSS in Dec largely WFL with pna at that time, but had sick contacts.   MBSS in 2023 and 2020 as well all with recs to cont regular/thin, but with some precuations/compensatory strategies. Pt with medical history significant of hypertension, CKD 3B, CVA, CAD status post CABG and stent, GERD, anemia, chronic diastolic CHF, anxiety, depression, dementia, lung cancer,  BPH, COPD.    Assessment / Plan / Recommendation  Clinical Impression  Pt presents with clinical indicators of pharyngeal dysphagia.  Pt is known to this service from prior admissions.  Has had MBSS in Dec 2024, Nov 2023, and July 2020 with largely functional swallowing and has been able to remain on regular texture diet with thin liquids with some recommendations for precautions/compensations.  Most recent MBSS in Dec admission for pna, but pt had sick contacts at that time.  This admission pt with pna and chest imaging concerning for aspiration.  Pt with baseline tachypnea, RR ~30.  Pt exhibited immediate cough with initial trial of thin liquid with serial straw sips.  Pt tolerated single straw sips without overt s/s of aspiration.  He stated he didn't want any more water twice during eval.  With puree there was delayed cough where pt brough up and expectorated a moderate amount of yellow mucus/secretions.  This was suspected to be unrelated to PO intake, though trials may have helped loosen secretions.  Pt fed self regular solid cracker.  There was oral residue which as reduced with lingual sweep and liquid wash. Discussed preferences with family member.  He would like repeat MBSS which is very reasonable given hx and presentation. SLP will plan for instrumental study as radiology schedule permits.  Pt may continue current diet pending results of swallow study.    Recommend regular texture diet with thin liquids, recommend single sips.   SLP Visit Diagnosis: Dysphagia, oropharyngeal phase (R13.12)    Aspiration Risk  Mild aspiration risk    Diet Recommendation Regular;Thin liquid    Liquid Administration via: Cup;Straw Medication Administration:  (As tolerated) Supervision: Staff to assist with self feeding Compensations: Small sips/bites (Single sips) Postural Changes: Seated upright at 90 degrees    Other  Recommendations Oral Care Recommendations: Oral care BID    Recommendations for  follow up therapy are one component of a multi-disciplinary discharge planning process, led by the attending physician.  Recommendations may be updated based on patient status, additional functional criteria and insurance authorization.  Follow up Recommendations  (TBD)      Assistance Recommended at Discharge  N/A  Functional Status Assessment  (TBD)  Frequency and Duration  (TBD)          Prognosis Prognosis for improved oropharyngeal function:  (TBD)      Swallow Study   General Date of Onset: 08/13/23 HPI: Evan [name] is a 89 y.o. male who presented with shortness of breath and recent pneumonia diagnosis. CXR 5/11: "Patchy bibasilar reticulonodular opacities, favored slightly  increased since June 2024. Findings likely reflect a combination of  aspiration, atelectasis or infection superimposed on a background of  chronic scarring."  Pt known to this service from prior admission.  MBSS in Dec largely WFL with pna at that time, but had sick contacts.   MBSS in 2023 and 2020 as well all with recs to cont regular/thin, but  with some precuations/compensatory strategies. Pt with medical history significant of hypertension, CKD 3B, CVA, CAD status post CABG and stent, GERD, anemia, chronic diastolic CHF, anxiety, depression, dementia, lung cancer, BPH, COPD. Type of Study: Bedside Swallow Evaluation Previous Swallow Assessment: MBSS 03/10/23, 02/17/22, 10/09/18 Diet Prior to this Study: Regular;Thin liquids (Level 0) Temperature Spikes Noted: No Respiratory Status: Nasal cannula History of Recent Intubation: No Behavior/Cognition: Alert;Cooperative;Pleasant mood Oral Cavity Assessment: Dry Oral Care Completed by SLP: No Oral Cavity - Dentition: Adequate natural dentition Vision: Functional for self-feeding Self-Feeding Abilities: Able to feed self Patient Positioning: Upright in bed Baseline Vocal Quality: Normal Volitional Cough: Strong Volitional Swallow: Unable to elicit (2/2  xerostomia)    Oral/Motor/Sensory Function Overall Oral Motor/Sensory Function: Moderate impairment Facial ROM: Within Functional Limits Facial Symmetry: Within Functional Limits Lingual ROM:  (reduced protrusion) Lingual Symmetry: Within Functional Limits Lingual Strength: Reduced Velum: Within Functional Limits Mandible: Within Functional Limits   Ice Chips Ice chips: Not tested   Thin Liquid Thin Liquid: Impaired Pharyngeal  Phase Impairments: Cough - Immediate (initial trial on with serial sips, eliminated with single straw sips)    Nectar Thick Nectar Thick Liquid: Not tested   Honey Thick Honey Thick Liquid: Not tested   Puree Puree: Within functional limits   Solid     Solid: Within functional limits      Elester Grim, MA, CCC-SLP Acute Rehabilitation Services Office: [phone] 08/14/2023,9:55 AM

## 2023-08-15 ENCOUNTER — Telehealth: Payer: Self-pay | Admitting: Cardiology

## 2023-08-15 DIAGNOSIS — J9601 Acute respiratory failure with hypoxia: Secondary | ICD-10-CM | POA: Diagnosis not present

## 2023-08-15 LAB — CBC
HCT: 27.6 % — ABNORMAL LOW (ref 39.0–52.0)
Hemoglobin: 9.3 g/dL — ABNORMAL LOW (ref 13.0–17.0)
MCH: 34.2 pg — ABNORMAL HIGH (ref 26.0–34.0)
MCHC: 33.7 g/dL (ref 30.0–36.0)
MCV: 101.5 fL — ABNORMAL HIGH (ref 80.0–100.0)
Platelets: 74 10*3/uL — ABNORMAL LOW (ref 150–400)
RBC: 2.72 MIL/uL — ABNORMAL LOW (ref 4.22–5.81)
RDW: 14.1 % (ref 11.5–15.5)
WBC: 4.9 10*3/uL (ref 4.0–10.5)
nRBC: 0 % (ref 0.0–0.2)

## 2023-08-15 LAB — BASIC METABOLIC PANEL WITH GFR
Anion gap: 11 (ref 5–15)
BUN: 25 mg/dL — ABNORMAL HIGH (ref 8–23)
CO2: 20 mmol/L — ABNORMAL LOW (ref 22–32)
Calcium: 8.4 mg/dL — ABNORMAL LOW (ref 8.9–10.3)
Chloride: 105 mmol/L (ref 98–111)
Creatinine, Ser: 1.32 mg/dL — ABNORMAL HIGH (ref 0.61–1.24)
GFR, Estimated: 50 mL/min — ABNORMAL LOW (ref >60)
Glucose, Bld: 105 mg/dL — ABNORMAL HIGH (ref 70–99)
Potassium: 3.6 mmol/L (ref 3.5–5.1)
Sodium: 136 mmol/L (ref 135–145)

## 2023-08-15 MED ORDER — GERHARDT'S BUTT CREAM
TOPICAL_CREAM | Freq: Two times a day (BID) | CUTANEOUS | Status: DC
  Filled 2023-08-15: qty 60

## 2023-08-15 MED ORDER — LOPERAMIDE HCL 2 MG PO CAPS
2.0000 mg | ORAL_CAPSULE | Freq: Once | ORAL | Status: AC
  Administered 2023-08-15: 2 mg via ORAL
  Filled 2023-08-15: qty 1

## 2023-08-15 MED ORDER — FUROSEMIDE 40 MG PO TABS
40.0000 mg | ORAL_TABLET | Freq: Every day | ORAL | Status: DC
  Administered 2023-08-15 – 2023-08-17 (×3): 40 mg via ORAL
  Filled 2023-08-15 (×3): qty 1

## 2023-08-15 MED ORDER — IPRATROPIUM-ALBUTEROL 0.5-2.5 (3) MG/3ML IN SOLN
3.0000 mL | Freq: Four times a day (QID) | RESPIRATORY_TRACT | Status: DC
  Administered 2023-08-15 (×3): 3 mL via RESPIRATORY_TRACT
  Filled 2023-08-15 (×4): qty 3

## 2023-08-15 NOTE — Telephone Encounter (Signed)
*  STAT* If patient is at the pharmacy, call can be transferred to refill team.   1. Which medications need to be refilled? (please list name of each medication and dose if known) rosuvastatin  (CRESTOR ) 5 MG tablet   4. Which pharmacy/location (including street and city if local pharmacy) is medication to be sent to?[facility] - [place], [place] - [address]    5. Do they need a 30 day or 90 day supply? 90   Pt told to f/u with Dr. Alda Amas in Oct per recall. Pharmacy called in to inform that the transmission failed.

## 2023-08-15 NOTE — Telephone Encounter (Signed)
 Requested Prescriptions  Pending Prescriptions Disp Refills   ferrous sulfate  (FEROSUL) 325 (65 FE) MG tablet [Pharmacy Med Name: FeroSul Oral Tablet 325 (65 Fe) MG] 90 tablet 0    Sig: TAKE 1 TABLET EVERY DAY     Endocrinology:  Minerals - Iron  Supplementation Failed - 08/15/2023  3:11 PM      Failed - HGB in normal range and within 360 days    Hemoglobin  Date Value Ref Range Status  08/15/2023 9.3 (L) 13.0 - 17.0 g/dL Final  16/01/9603 54.0 (L) 13.0 - 17.7 g/dL Final   HGB  Date Value Ref Range Status  08/16/2016 13.4 13.0 - 17.1 g/dL Final         Failed - HCT in normal range and within 360 days    HCT  Date Value Ref Range Status  08/15/2023 27.6 (L) 39.0 - 52.0 % Final  08/16/2016 39.8 38.4 - 49.9 % Final   Hematocrit  Date Value Ref Range Status  10/14/2022 31.0 (L) 37.5 - 51.0 % Final         Failed - RBC in normal range and within 360 days    RBC  Date Value Ref Range Status  08/15/2023 2.72 (L) 4.22 - 5.81 MIL/uL Final         Passed - Fe (serum) in normal range and within 360 days    Iron   Date Value Ref Range Status  09/27/2022 71 45 - 182 ug/dL Final   Saturation Ratios  Date Value Ref Range Status  09/27/2022 27 17.9 - 39.5 % Final         Passed - Ferritin in normal range and within 360 days    Ferritin  Date Value Ref Range Status  09/27/2022 88 24 - 336 ng/mL Final    Comment:    Performed at [facility], [address], [place], [place] [address]         Passed - Valid encounter within last 12 months    Recent Outpatient Visits           5 days ago Acute cough   [facility] Cheril Cork, Cisco Crest, MD   1 month ago Cellulitis and abscess of foot   [facility] Cheril Cork, Cisco Crest, MD   3 months ago Bilateral impacted cerumen   [facility] Austine Lefort, MD   5 months ago Sepsis due to Haemophilus influenzae, unspecified whether acute organ  dysfunction present [facility])   [facility] Austine Lefort, MD   7 months ago Diarrhea, unspecified type   [place] Pickard, Cisco Crest, MD

## 2023-08-15 NOTE — Plan of Care (Signed)

## 2023-08-15 NOTE — Progress Notes (Addendum)
 PROGRESS NOTE    Evan [name]  NGE:[id number] DOB: [birth year] DOA: 08/13/2023 PCP: Austine Lefort, MD  89/M hypertension, CKD 3B, CVA, CAD status post CABG and stent, GERD, anemia, chronic diastolic CHF, anxiety, depression, dementia, lung cancer, BPH, COPD presenting with shortness of breath and recent pneumonia diagnosis. - had about a week of cough, body aches, tachycardia.  Was seen by his PCP 3 days ago and started on Augmentin  and azithromycin , had recurrence of fevers with increased work of breathing. - In the ER tachycardic, mildly hypoxic, creatinine 1.3, hemoglobin 9.9, no leukocytosis, BNP 587, troponin 21, chest x-ray with small bilateral effusions, patchy opacities in the background of chronic scarring - Admitted, started on antibiotics and diuretics, SLP eval  Subjective: - Looks better per family, on 4 L O2, cough improving  Assessment and Plan:  Acute respiratory failure with hypoxia - Suspect combination of pneumonia primarily possibly aspiration and some volume overload - Management as below  Bilateral pneumonia, suspect aspiration -Presenting with cough, myalgias, fevers - Continue ceftriaxone  and azithromycin  IV 1 more day, transition to oral Augmentin  tomorrow, add DuoNebs, mild wheezing noted -Positive metapneumovirus as well - SLP failed with concern for some oropharyngeal dysphagia, now on thickened liquids -Wean O2  Acute on chronic diastolic CHF -does not appear significantly volume overloaded at this time, continue metoprolol , start oral Lasix  -Last echo 12/24 with preserved EF, normal RV, moderately elevated PA systolic pressures - Conservative management on account of advanced age, frailty    History of COPD versus asthma - Replace home Symbicort  with formulary Dulera    History of lung cancer status post radiation History of radiation pneumonitis - Noted   Hypertension - Lasix  as above - Continue metoprolol    Hyperlipidemia - Continue home  rosuvastatin    GERD - Continue home PPI   CKD 3B > Creatinine stable in the ED at 1.35 - Trend renal function and electrolytes   History of CVA - Continue home Eliquis , rosuvastatin    History of atrial fibrillation - Continue home metoprolol , Eliquis    CAD > Status post CABG and stenting - Continue home metoprolol , rosuvastatin , Eliquis    History of QTc prolongation -Monitor   Dementia Depression -At risk of delirium and sundowning - Continue home fluoxetine    History of DVT/PE - Continue Eliquis     DVT prophylaxis:      Eliquis  Code Status:              Full> discussed with son and daughter yesterday, suggested consideration of DNR, they will think about this Family Communication:     Son updated at bedside  Disposition Plan: Home with family likely 48 hours  Objective: Vitals:   08/14/23 2020 08/15/23 0037 08/15/23 0402 08/15/23 0736  BP:  125/68 124/83 (!) 140/78  Pulse:  76 73 71  Resp:  (!) 23 (!) 22 (!) 24  Temp:  97.9 F (36.6 C) 98.4 F (36.9 C) 97.6 F (36.4 C)  TempSrc:  Oral Oral Oral  SpO2: 98% 99% 100% 96%  Weight:   64.2 kg   Height:        Intake/Output Summary (Last 24 hours) at 08/15/2023 0933 Last data filed at 08/15/2023 0600 Gross per 24 hour  Intake 906.08 ml  Output 275 ml  Net 631.08 ml   Filed Weights   08/13/23 1800 08/14/23 0535 08/15/23 0402  Weight: 64.5 kg 65 kg 64.2 kg    Examination:  General exam: Elderly chronically ill male laying in bed, AAO x 2, moderate  cognitive deficits, hard of hearing to HEENT: No JVD CVS: S1-S2, irregular rhythm Lungs: Bilateral scattered rhonchi and rales, few wheezes  Abdomen: Soft, nontender, bowel sounds present Extremities: No edema  Skin: No rashes Psychiatry: Flat affect, confused    Data Reviewed:   CBC: Recent Labs  Lab 08/10/23 1633 08/13/23 1020 08/14/23 0835 08/15/23 0317  WBC 6.5 6.4 4.9 4.9  NEUTROABS 4,576 5.1  --   --   HGB 9.9* 9.9* 10.0* 9.3*  HCT 28.9*  30.1* 30.1* 27.6*  MCV 98.6 104.5* 102.7* 101.5*  PLT 108* 87* 79* 74*   Basic Metabolic Panel: Recent Labs  Lab 08/10/23 1633 08/13/23 1020 08/13/23 1821 08/14/23 0835 08/15/23 0317  NA 139 134*  --  134* 136  K 3.8 3.7  --  3.3* 3.6  CL 106 103  --  101 105  CO2 18* 19*  --  25 20*  GLUCOSE 107* 108*  --  167* 105*  BUN 33* 29*  --  23 25*  CREATININE 1.33* 1.35*  --  1.32* 1.32*  CALCIUM  8.9 8.8*  --  8.8* 8.4*  MG  --   --  1.8  --   --    GFR: Estimated Creatinine Clearance: 27 mL/min (A) (by C-G formula based on SCr of 1.32 mg/dL (H)). Liver Function Tests: Recent Labs  Lab 08/10/23 1633 08/14/23 0835  AST 16 25  ALT 10 10  ALKPHOS  --  75  BILITOT 0.6 0.7  PROT 6.7 6.6  ALBUMIN  --  2.9*   No results for input(s): "LIPASE", "AMYLASE" in the last 168 hours. No results for input(s): "AMMONIA" in the last 168 hours. Coagulation Profile: No results for input(s): "INR", "PROTIME" in the last 168 hours. Cardiac Enzymes: No results for input(s): "CKTOTAL", "CKMB", "CKMBINDEX", "TROPONINI" in the last 168 hours. BNP (last 3 results) No results for input(s): "PROBNP" in the last 8760 hours. HbA1C: No results for input(s): "HGBA1C" in the last 72 hours. CBG: No results for input(s): "GLUCAP" in the last 168 hours. Lipid Profile: No results for input(s): "CHOL", "HDL", "LDLCALC", "TRIG", "CHOLHDL", "LDLDIRECT" in the last 72 hours. Thyroid  Function Tests: No results for input(s): "TSH", "T4TOTAL", "FREET4", "T3FREE", "THYROIDAB" in the last 72 hours. Anemia Panel: No results for input(s): "VITAMINB12", "FOLATE", "FERRITIN", "TIBC", "IRON ", "RETICCTPCT" in the last 72 hours. Urine analysis:    Component Value Date/Time   COLORURINE AMBER (A) 03/08/2023 1142   APPEARANCEUR CLOUDY (A) 03/08/2023 1142   LABSPEC 1.015 03/08/2023 1142   PHURINE 5.0 03/08/2023 1142   GLUCOSEU NEGATIVE 03/08/2023 1142   HGBUR MODERATE (A) 03/08/2023 1142   BILIRUBINUR NEGATIVE  03/08/2023 1142   KETONESUR NEGATIVE 03/08/2023 1142   PROTEINUR 100 (A) 03/08/2023 1142   UROBILINOGEN 0.2 07/13/2014 1804   NITRITE NEGATIVE 03/08/2023 1142   LEUKOCYTESUR LARGE (A) 03/08/2023 1142   Sepsis Labs: @LABRCNTIP (procalcitonin:4,lacticidven:4)  ) Recent Results (from the past 240 hours)  Resp panel by RT-PCR (RSV, Flu A&B, Covid) Anterior Nasal Swab     Status: None   Collection Time: 08/13/23  9:58 AM   Specimen: Anterior Nasal Swab  Result Value Ref Range Status   SARS Coronavirus 2 by RT PCR NEGATIVE NEGATIVE Final   Influenza A by PCR NEGATIVE NEGATIVE Final   Influenza B by PCR NEGATIVE NEGATIVE Final    Comment: (NOTE) The Xpert Xpress SARS-CoV-2/FLU/RSV plus assay is intended as an aid in the diagnosis of influenza from Nasopharyngeal swab specimens and should not be used as  a sole basis for treatment. Nasal washings and aspirates are unacceptable for Xpert Xpress SARS-CoV-2/FLU/RSV testing.  Fact Sheet for Patients: BloggerCourse.com  Fact Sheet for Healthcare Providers: SeriousBroker.it  This test is not yet approved or cleared by the [place]  FDA and has been authorized for detection and/or diagnosis of SARS-CoV-2 by FDA under an Emergency Use Authorization (EUA). This EUA will remain in effect (meaning this test can be used) for the duration of the COVID-19 declaration under Section 564(b)(1) of the Act, 21 U.S.C. section 360bbb-3(b)(1), unless the authorization is terminated or revoked.     Resp Syncytial Virus by PCR NEGATIVE NEGATIVE Final    Comment: (NOTE) Fact Sheet for Patients: BloggerCourse.com  Fact Sheet for Healthcare Providers: SeriousBroker.it  This test is not yet approved or cleared by the [place]  FDA and has been authorized for detection and/or diagnosis of SARS-CoV-2 by FDA under an Emergency Use Authorization (EUA).  This EUA will remain in effect (meaning this test can be used) for the duration of the COVID-19 declaration under Section 564(b)(1) of the Act, 21 U.S.C. section 360bbb-3(b)(1), unless the authorization is terminated or revoked.  Performed at [facility], [address] [place], [place] [address]   Respiratory (~20 pathogens) panel by PCR     Status: Abnormal   Collection Time: 08/13/23  5:26 PM   Specimen: Nasopharyngeal Swab; Respiratory  Result Value Ref Range Status   Adenovirus NOT DETECTED NOT DETECTED Final   Coronavirus 229E NOT DETECTED NOT DETECTED Final    Comment: (NOTE) The Coronavirus on the Respiratory Panel, DOES NOT test for the novel  Coronavirus (2019 nCoV)    Coronavirus HKU1 NOT DETECTED NOT DETECTED Final   Coronavirus NL63 NOT DETECTED NOT DETECTED Final   Coronavirus OC43 NOT DETECTED NOT DETECTED Final   Metapneumovirus DETECTED (A) NOT DETECTED Final   Rhinovirus / Enterovirus NOT DETECTED NOT DETECTED Final   Influenza A NOT DETECTED NOT DETECTED Final   Influenza B NOT DETECTED NOT DETECTED Final   Parainfluenza Virus 1 NOT DETECTED NOT DETECTED Final   Parainfluenza Virus 2 NOT DETECTED NOT DETECTED Final   Parainfluenza Virus 3 NOT DETECTED NOT DETECTED Final   Parainfluenza Virus 4 NOT DETECTED NOT DETECTED Final   Respiratory Syncytial Virus NOT DETECTED NOT DETECTED Final   Bordetella pertussis NOT DETECTED NOT DETECTED Final   Bordetella Parapertussis NOT DETECTED NOT DETECTED Final   Chlamydophila pneumoniae NOT DETECTED NOT DETECTED Final   Mycoplasma pneumoniae NOT DETECTED NOT DETECTED Final    Comment: Performed at [facility], [address], [place], [place] [address]     Radiology Studies: DG Swallowing Func-Speech Pathology Result Date: 08/14/2023 Table formatting from the original result was not included. Modified Barium Swallow Study Patient Details Name: Evan [name] MRN: [id number] Date of Birth: May 23, 1927 Today's  Date: 08/14/2023 HPI/PMH: HPI: Evan [name] is a 89 y.o. male who presented with shortness of breath and recent pneumonia diagnosis. CXR 5/11: "Patchy bibasilar reticulonodular opacities, favored slightly  increased since June 2024. Findings likely reflect a combination of  aspiration, atelectasis or infection superimposed on a background of  chronic scarring."  Pt known to this service from prior admission.  MBSS in Dec largely WFL with pna at that time, but had sick contacts.   MBSS in 2023 and 2020 as well all with recs to cont regular/thin, but with some precuations/compensatory strategies. Pt with medical history significant of hypertension, CKD 3B, CVA, CAD status post CABG  and stent, GERD, anemia, chronic diastolic CHF, anxiety, depression, dementia, lung cancer, BPH, COPD. Clinical Impression: Clinical Impression: Patient presents with a mild oropharyngeal dysphagia characterized by midlly prolonged oral transit of bolus with regular solids however remains functional. Largest deficit is a delay in swallow initiation to the pyriform sinuses which result in deep penetration and aspiration of thin liquids which is not consistently sensed. Trials of nectar thick liquid significantly increased airway protection wtih only subtle penetration noted with large bolus via straw. Laryngeal/pharyngeal strength intact. Appearance of a shadow, question calcification along posterior pharyngeal wall, above UES, moves partially with movement of other swallowing structures and does not appear to be impacting swallowing function overall, defer further w/u to MD. At this time, recommend regular solids with nectar thick liquids. SLP will f/u. Factors that may increase risk of adverse event in presence of aspiration Roderick Civatte & Jessy Morocco 2021): Factors that may increase risk of adverse event in presence of aspiration Roderick Civatte & Jessy Morocco 2021): Respiratory or GI disease Recommendations/Plan: Swallowing Evaluation Recommendations  Swallowing Evaluation Recommendations Recommendations: PO diet PO Diet Recommendation: Regular; Mildly thick liquids (Level 2, nectar thick) Liquid Administration via: Cup; No straw Medication Administration: Whole meds with puree Supervision: Patient able to self-feed; Full supervision/cueing for swallowing strategies Swallowing strategies  : Slow rate; Small bites/sips Postural changes: Position pt fully upright for meals Oral care recommendations: Oral care BID (2x/day) Treatment Plan Treatment Plan Treatment recommendations: Therapy as outlined in treatment plan below Functional status assessment: Patient has had a recent decline in their functional status and demonstrates the ability to make significant improvements in function in a reasonable and predictable amount of time. Treatment frequency: Min 2x/week Treatment duration: 2 weeks Interventions: Aspiration precaution training; Compensatory techniques; Patient/family education; Trials of upgraded texture/liquids; Diet toleration management by SLP Recommendations Recommendations for follow up therapy are one component of a multi-disciplinary discharge planning process, led by the attending physician.  Recommendations may be updated based on patient status, additional functional criteria and insurance authorization. Assessment: Orofacial Exam: Orofacial Exam Oral Cavity: Oral Hygiene: Dried secretions Oral Cavity - Dentition: Adequate natural dentition Orofacial Anatomy: WFL Oral Motor/Sensory Function: -- (some left sided assymetry of the face, otherwise WFL) Anatomy: Anatomy: Other (Comment) (appearance of a shadow, question calcification along posterior pharyngeal wall, above UES, moves partially with movement of other swallowing structures and does not appear to be impacting swallowing function overall, defer further w/u to MBS) Boluses Administered: Boluses Administered Boluses Administered: Thin liquids (Level 0); Mildly thick liquids (Level 2, nectar  thick); Puree; Solid  Oral Impairment Domain: Oral Impairment Domain Lip Closure: Escape beyond mid-chin Tongue control during bolus hold: Cohesive bolus between tongue to palatal seal Bolus preparation/mastication: Slow prolonged chewing/mashing with complete recollection Bolus transport/lingual motion: Delayed initiation of tongue motion (oral holding) Oral residue: Trace residue lining oral structures Location of oral residue : Tongue Initiation of pharyngeal swallow : Pyriform sinuses  Pharyngeal Impairment Domain: Pharyngeal Impairment Domain Soft palate elevation: No bolus between soft palate (SP)/pharyngeal wall (PW) Laryngeal elevation: Complete superior movement of thyroid  cartilage with complete approximation of arytenoids to epiglottic petiole Anterior hyoid excursion: Complete anterior movement Epiglottic movement: Complete inversion Laryngeal vestibule closure: Incomplete, narrow column air/contrast in laryngeal vestibule Pharyngeal stripping wave : -- (difficult to view with calcification) Pharyngeal contraction (A/P view only): N/A Pharyngoesophageal segment opening: Complete distension and complete duration, no obstruction of flow Tongue base retraction: No contrast between tongue base and posterior pharyngeal wall (PPW) Pharyngeal residue: Complete pharyngeal clearance  Esophageal  Impairment Domain: Esophageal Impairment Domain Esophageal clearance upright position: Complete clearance, esophageal coating Pill: Pill Consistency administered: Puree Puree: WFL Penetration/Aspiration Scale Score: Penetration/Aspiration Scale Score 1.  Material does not enter airway: Puree; Pill; Solid 3.  Material enters airway, remains ABOVE vocal cords and not ejected out: Mildly thick liquids (Level 2, nectar thick) 5.  Material enters airway, CONTACTS cords and not ejected out: Thin liquids (Level 0) 7.  Material enters airway, passes BELOW cords and not ejected out despite cough attempt by patient: Thin liquids  (Level 0) 8.  Material enters airway, passes BELOW cords without attempt by patient to eject out (silent aspiration) : Thin liquids (Level 0) Compensatory Strategies: Compensatory Strategies Compensatory strategies: Yes Chin tuck: Ineffective Ineffective Chin Tuck: Thin liquid (Level 0)   General Information: Caregiver present: No  Diet Prior to this Study: Regular; Thin liquids (Level 0)   Temperature : Normal   Respiratory Status: WFL   Supplemental O2: Nasal cannula   History of Recent Intubation: No  Behavior/Cognition: Alert; Cooperative; Pleasant mood; Requires cueing Self-Feeding Abilities: Able to self-feed No data recorded Volitional Cough: Able to elicit Volitional Swallow: Able to elicit No data recorded Goal Planning: Prognosis for improved oropharyngeal function: Fair Barriers to Reach Goals: Other (Comment) (baseline dysphagia and advanced age) No data recorded Patient/Family Stated Goal: MBS Consulted and agree with results and recommendations: Patient (family not in room after study) Pain: Pain Assessment Pain Assessment: No/denies pain Breathing: 0 Negative Vocalization: 0 Facial Expression: 0 Body Language: 0 Consolability: 0 PAINAD Score: 0 End of Session: Start Time:SLP Start Time (ACUTE ONLY): 1315 Stop Time: SLP Stop Time (ACUTE ONLY): 1329 Time Calculation:SLP Time Calculation (min) (ACUTE ONLY): 14 min Charges: SLP Evaluations $ SLP Speech Visit: 1 Visit SLP Evaluations $BSS Swallow: 1 Procedure $MBS Swallow: 1 Procedure SLP visit diagnosis: SLP Visit Diagnosis: Dysphagia, oropharyngeal phase (R13.12) Past Medical History: Past Medical History: Diagnosis Date  Acute blood loss anemia   Acute bronchitis 05/15/2017  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 07/14/2018  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  Altered mental status 08/21/2018  Anginal chest pain at rest [facility])   Chronic non, controlled on Lorazepam   Anxiety    Arthritis   "all over"   BPH (benign prostatic hyperplasia)   CAD (coronary artery disease)   Cellulitis of arm, right 06/01/2018  Cellulitis of left axilla   Chronic lower back pain   Complication of anesthesia   "problems making water afterwards"  Depression   DJD (degenerative joint disease)   Esophageal ulcer without bleeding   bid ppi indefinitely  Fall 07/16/2018  Family history of adverse reaction to anesthesia   "children w/PONV"  GERD (gastroesophageal reflux disease)   Haemophilus influenzae septicemia (HCC) 03/12/2023  Headache   "couple/week maybe" (09/04/2015)  Hemochromatosis   Possible, elevated iron  stores, hook-like osteophytes on hand films, normal LFTs  Hepatitis   "yellow jaundice as a baby"  History of ASCVD   MULTIVESSEL  Hyperlipidemia   Hypertension   Hypotension   Mild aortic stenosis 06/23/2021  Osteoarthritis   Peripheral vascular disease (HCC)   Pneumonia   "several times; got a little now" (09/04/2015)  Pulmonary embolism (HCC) 02/02/2018  Rash and nonspecific skin eruption 06/01/2018  Rheumatoid arthritis (HCC)   RSV (respiratory syncytial virus pneumonia) 05/15/2022  Subdural hematoma (HCC)   small after fall 09/2016-plavix  held, neurosurgery consulted.  Asymptomatic.    Thromboembolism (HCC) 02/2018  on Lovenox  lifelong since he failed PO  Eliquis  Past Surgical History: Past Surgical History: Procedure Laterality Date  ARM SKIN LESION BIOPSY / EXCISION Left 09/02/2015  CATARACT EXTRACTION W/ INTRAOCULAR LENS  IMPLANT, BILATERAL Bilateral   CHOLECYSTECTOMY OPEN    CORNEAL TRANSPLANT Bilateral   "one at [facility]; one at [facility]"  CORONARY ANGIOPLASTY WITH STENT PLACEMENT    CORONARY ARTERY BYPASS GRAFT  1999  DESCENDING AORTIC ANEURYSM REPAIR W/ STENT    DE stent ostium into the right radial free graft at OM-, 02-2007  ESOPHAGOGASTRODUODENOSCOPY N/A 11/09/2014  Procedure: ESOPHAGOGASTRODUODENOSCOPY (EGD);  Surgeon: Delilah Fend, MD;  [facility] END[facility];  Service: Endoscopy;  Laterality: N/A;   INGUINAL HERNIA REPAIR    JOINT REPLACEMENT    LEFT HEART CATH AND CORS/GRAFTS ANGIOGRAPHY N/A 06/27/2017  Procedure: LEFT HEART CATH AND CORS/GRAFTS ANGIOGRAPHY;  Surgeon: Millicent Ally, MD;  Location: [facility] INVASIVE CV LAB;  Service: Cardiovascular;  Laterality: N/A;  NASAL SINUS SURGERY    SHOULDER OPEN ROTATOR CUFF REPAIR Right   TOTAL KNEE ARTHROPLASTY Bilateral  Delsa Fife MA, CCC-SLP McCoy Leah Meryl 08/14/2023, 1:52 PM  DG Chest Port 1 View Result Date: 08/13/2023 CLINICAL DATA:  fever cough EXAM: PORTABLE CHEST 1 VIEW COMPARISON:  March 11, 2023, September 27, 2022 FINDINGS: The cardiomediastinal silhouette is unchanged in contour.Status post median sternotomy. Small bilateral pleural effusions. No pneumothorax. There are patchy bibasilar reticulonodular opacities, many of which are chronic in appearance. Gaseous distension of bowel in the upper abdomen. IMPRESSION: 1. Small bilateral pleural effusions. 2. Patchy bibasilar reticulonodular opacities, favored slightly increased since June 2024. Findings likely reflect a combination of aspiration, atelectasis or infection superimposed on a background of chronic scarring. 3. Gaseous distension of bowel. Electronically Signed   By: Clancy Crimes M.D.   On: 08/13/2023 11:05     Scheduled Meds:  apixaban   2.5 mg Oral BID   Chlorhexidine  Gluconate Cloth  6 each Topical Q0600   FLUoxetine   20 mg Oral Daily   metoprolol  succinate  12.5 mg Oral Daily   mometasone -formoterol   2 puff Inhalation BID   nystatin   1 Application Topical BID   pantoprazole   40 mg Oral q morning   rosuvastatin   5 mg Oral QHS   sodium chloride  flush  3 mL Intravenous Q12H   tamsulosin   0.4 mg Oral QHS   Continuous Infusions:  azithromycin  500 mg (08/14/23 1102)   cefTRIAXone  (ROCEPHIN )  IV 2 g (08/15/23 0827)     LOS: 1 day    Time spent:    Deforest Fast, MD [facility]   08/15/2023, 9:33 AM

## 2023-08-15 NOTE — Telephone Encounter (Signed)
 Called [facility] mail order pharmacy to inform them that pt is currently admitted in the hospital and pt does not need a refill on his medication rosuvastatin  at this time. Pharmacy verbalized understanding.

## 2023-08-15 NOTE — TOC CM/SW Note (Signed)
 Transition of Care [facility]) - Inpatient Brief Assessment   Patient Details  Name: Evan [name] MRN: [id number] Date of Birth: [birth year]  Transition of Care [facility]) CM/SW Contact:    Jennett Model, RN Phone Number: 08/15/2023, 4:47 PM   Clinical Narrative: Patient gives this NCM permission to speak with daughter , Se[place]ron [place].  From home alone, has PCP and insurance on file, states has no HH services in place at this time , has walker/cane  at home.  States family member will transport them home at [employer] and family is support system, states gets medications from [facility].  Pta self ambulatory with walker/cane.    Transition of Care Asessment: Insurance and Status: Insurance coverage has been reviewed Patient has primary care physician: Yes Home environment has been reviewed: home alone Prior level of function:: ambulatory with walker Prior/Current Home Services: Current home services (walker/cane) Social Drivers of Health Review: SDOH reviewed no interventions necessary Readmission risk has been reviewed: Yes Transition of care needs: no transition of care needs at this time

## 2023-08-16 DIAGNOSIS — F0390 Unspecified dementia without behavioral disturbance: Secondary | ICD-10-CM | POA: Diagnosis not present

## 2023-08-16 DIAGNOSIS — I10 Essential (primary) hypertension: Secondary | ICD-10-CM | POA: Diagnosis not present

## 2023-08-16 DIAGNOSIS — J9601 Acute respiratory failure with hypoxia: Secondary | ICD-10-CM | POA: Diagnosis not present

## 2023-08-16 DIAGNOSIS — C3491 Malignant neoplasm of unspecified part of right bronchus or lung: Secondary | ICD-10-CM | POA: Diagnosis not present

## 2023-08-16 LAB — CBC
HCT: 25.9 % — ABNORMAL LOW (ref 39.0–52.0)
Hemoglobin: 8.8 g/dL — ABNORMAL LOW (ref 13.0–17.0)
MCH: 34 pg (ref 26.0–34.0)
MCHC: 34 g/dL (ref 30.0–36.0)
MCV: 100 fL (ref 80.0–100.0)
Platelets: 72 10*3/uL — ABNORMAL LOW (ref 150–400)
RBC: 2.59 MIL/uL — ABNORMAL LOW (ref 4.22–5.81)
RDW: 13.7 % (ref 11.5–15.5)
WBC: 5.3 10*3/uL (ref 4.0–10.5)
nRBC: 0 % (ref 0.0–0.2)

## 2023-08-16 LAB — BASIC METABOLIC PANEL WITH GFR
Anion gap: 8 (ref 5–15)
BUN: 23 mg/dL (ref 8–23)
CO2: 22 mmol/L (ref 22–32)
Calcium: 8.3 mg/dL — ABNORMAL LOW (ref 8.9–10.3)
Chloride: 105 mmol/L (ref 98–111)
Creatinine, Ser: 1.19 mg/dL (ref 0.61–1.24)
GFR, Estimated: 57 mL/min — ABNORMAL LOW (ref >60)
Glucose, Bld: 101 mg/dL — ABNORMAL HIGH (ref 70–99)
Potassium: 3.3 mmol/L — ABNORMAL LOW (ref 3.5–5.1)
Sodium: 135 mmol/L (ref 135–145)

## 2023-08-16 MED ORDER — DIPHENHYDRAMINE-ZINC ACETATE 2-0.1 % EX CREA: TOPICAL_CREAM | Freq: Three times a day (TID) | CUTANEOUS | Status: DC | PRN

## 2023-08-16 MED ORDER — DIPHENHYDRAMINE HCL 25 MG PO CAPS
25.0000 mg | ORAL_CAPSULE | Freq: Once | ORAL | Status: DC | PRN
  Filled 2023-08-16: qty 1

## 2023-08-16 MED ORDER — AMOXICILLIN-POT CLAVULANATE 500-125 MG PO TABS
1.0000 | ORAL_TABLET | Freq: Two times a day (BID) | ORAL | Status: DC
  Administered 2023-08-16 – 2023-08-18 (×5): 1 via ORAL
  Filled 2023-08-16 (×6): qty 1

## 2023-08-16 MED ORDER — POTASSIUM CHLORIDE CRYS ER 20 MEQ PO TBCR
40.0000 meq | EXTENDED_RELEASE_TABLET | Freq: Once | ORAL | Status: AC
Start: 2023-08-16 — End: 2023-08-16
  Administered 2023-08-16: 40 meq via ORAL
  Filled 2023-08-16: qty 2

## 2023-08-16 MED ORDER — SACCHAROMYCES BOULARDII 250 MG PO CAPS
250.0000 mg | ORAL_CAPSULE | Freq: Two times a day (BID) | ORAL | Status: DC
  Administered 2023-08-16 – 2023-08-21 (×11): 250 mg via ORAL
  Filled 2023-08-16 (×11): qty 1

## 2023-08-16 MED ORDER — HYDROXYZINE HCL 10 MG/5ML PO SYRP
10.0000 mg | ORAL_SOLUTION | Freq: Three times a day (TID) | ORAL | Status: DC | PRN
  Administered 2023-08-16 – 2023-08-21 (×10): 10 mg via ORAL
  Filled 2023-08-16 (×15): qty 5

## 2023-08-16 MED ORDER — IPRATROPIUM-ALBUTEROL 0.5-2.5 (3) MG/3ML IN SOLN
3.0000 mL | Freq: Two times a day (BID) | RESPIRATORY_TRACT | Status: DC
  Administered 2023-08-16 – 2023-08-17 (×3): 3 mL via RESPIRATORY_TRACT
  Filled 2023-08-16 (×2): qty 3

## 2023-08-16 NOTE — Evaluation (Signed)
 Physical Therapy Evaluation Patient Details Name: Evan [name] MRN: [id number] DOB: 09-29-1927 Today's Date: 08/16/2023  History of Present Illness  89 y.o. male admitted 08/13/23 with SOB and hypoxia requiring 2-4L O2.  CXR positive for small bilateral effusions and BNP elevated.   PMH: CAD sp CABG, systolic CHF, BPH, esophageal ulcer, GERD, HLD, HTN, CKD 3B, lung CA, dementia, hx of subdural hematoma in the past, HOH, thrombocytopenia.  Clinical Impression  Patient presents with decreased mobility due to generalized weakness, deconditioning from prolonged bedrest and decreased activity tolerance.  Currently mod A for mobility with short distance ambulation in the room and standing at sink.  On RA throughout with spot check SpO2 90% or greater.  Patient soiled with BM so standing at sink for hygiene.  Daughter present and supportive.  Has needed equipment at home.  Feel stable for home when medically ready, though PT will continue to follow in the acute setting.         If plan is discharge home, recommend the following: A lot of help with walking and/or transfers;A lot of help with bathing/dressing/bathroom;Assist for transportation;Help with stairs or ramp for entrance   Can travel by private vehicle        Equipment Recommendations None recommended by PT  Recommendations for Other Services       Functional Status Assessment Patient has had a recent decline in their functional status and demonstrates the ability to make significant improvements in function in a reasonable and predictable amount of time.     Precautions / Restrictions Precautions Precautions: Fall Required Braces or Orthoses: Other Brace Other Brace: R LE metal upright AFO from home      Mobility  Bed Mobility Overal bed mobility: Needs Assistance Bed Mobility: Supine to Sit, Sit to Supine     Supine to sit: Mod assist, HOB elevated, Used rails Sit to supine: Mod assist   General bed mobility comments:  lifting help for trunk and to scoot to EOB, assist for LE into supine    Transfers Overall transfer level: Needs assistance Equipment used: Rolling walker (2 wheels) Transfers: Sit to/from Stand Sit to Stand: Mod assist, From elevated surface           General transfer comment: donned shoes and AFO on EOB; lifting help to stand from higher surface with posterior bias    Ambulation/Gait Ambulation/Gait assistance: Mod assist Gait Distance (Feet): 5 Feet (x 2) Assistive device: Rolling walker (2 wheels) Gait Pattern/deviations: Step-to pattern, Decreased stride length, Shuffle, Scissoring, Decreased step length - right, Decreased dorsiflexion - right       General Gait Details: assist with RW (used to platform walker) and for balance with posterior bias at times, assist due to R leg dragging or crossing over with short distance ambulation in the room to sink, then back to EOB  Stairs            Wheelchair Mobility     Tilt Bed    Modified Rankin (Stroke Patients Only)       Balance Overall balance assessment: Needs assistance Sitting-balance support: Feet supported Sitting balance-[name] Scale: Fair Sitting balance - Comments: S for safety on EOB   Standing balance support: Single extremity supported, Reliant on assistive device for balance Standing balance-[name] Scale: Poor Standing balance comment: A for balance while standing at sink for perineal hygiene with pt leaning back needing support while PT assisting with hygiene  Pertinent Vitals/Pain Pain Assessment Pain Assessment: No/denies pain    Home Living Family/patient expects to be discharged to:: Private residence Living Arrangements: Alone;Children Available Help at Discharge: Available 24 hours/day Type of Home: House Home Access: Ramped entrance       Home Layout: Two level;Able to live on main level with bedroom/bathroom Home Equipment: Shower seat;Grab  bars - tub/shower;Hospital bed;Wheelchair - manual;Hand held shower head;Other (comment) Additional Comments: uses Bil plaftorm RW at baseline; R metal upright AFO    Prior Function Prior Level of Function : Needs assist             Mobility Comments: Family provides physical assist with all gait, uses bil platform RW in the house with wheelchair follow ADLs Comments: Family assists with all ADLs; pt can feed himself     Extremity/Trunk Assessment   Upper Extremity Assessment Upper Extremity Assessment: RUE deficits/detail RUE Deficits / Details: hemiparesis held in flexion and internal rotation    Lower Extremity Assessment Lower Extremity Assessment: RLE deficits/detail RLE Deficits / Details: ataxic and with spastic features with limited active ankle DF RLE Coordination: decreased gross motor    Cervical / Trunk Assessment Cervical / Trunk Assessment: Kyphotic  Communication   Communication Communication: Impaired Factors Affecting Communication: Hearing impaired    Cognition Arousal: Alert Behavior During Therapy: WFL for tasks assessed/performed   PT - Cognitive impairments: History of cognitive impairments                         Following commands: Intact       Cueing Cueing Techniques: Verbal cues, Gestural cues     General Comments General comments (skin integrity, edema, etc.): daughter in the room and providing history; SpO2 on RA througout 90% or greater though dyspneic and RR up to mid 20's at times; HR stable    Exercises     Assessment/Plan    PT Assessment Patient needs continued PT services  PT Problem List Decreased strength;Decreased mobility;Decreased activity tolerance;Decreased balance;Decreased knowledge of use of DME       PT Treatment Interventions DME instruction;Therapeutic exercise;Gait training;Balance training;Functional mobility training;Therapeutic activities;Patient/family education    PT Goals (Current goals can  be found in the Care Plan section)  Acute Rehab PT Goals Patient Stated Goal: return home PT Goal Formulation: With patient/family Time For Goal Achievement: 08/30/23 Potential to Achieve Goals: Fair    Frequency Min 2X/week     Co-evaluation               AM-PAC PT "6 Clicks" Mobility  Outcome Measure Help needed turning from your back to your side while in a flat bed without using bedrails?: A Lot Help needed moving from lying on your back to sitting on the side of a flat bed without using bedrails?: A Lot Help needed moving to and from a bed to a chair (including a wheelchair)?: A Lot Help needed standing up from a chair using your arms (e.g., wheelchair or bedside chair)?: A Lot Help needed to walk in hospital room?: Total Help needed climbing 3-5 steps with a railing? : Total 6 Click Score: 10    End of Session Equipment Utilized During Treatment: Gait belt Activity Tolerance: Patient limited by fatigue Patient left: in bed;with call bell/phone within reach   PT Visit Diagnosis: Other abnormalities of gait and mobility (R26.89);Ataxic gait (R26.0)    Time: 9562-1308 PT Time Calculation (min) (ACUTE ONLY): 34 min   Charges:   PT  Evaluation $PT Eval Moderate Complexity: 1 Mod PT Treatments $Gait Training: 8-22 mins PT General Charges $$ ACUTE PT VISIT: 1 Visit         Abigail Hoff, PT Acute Rehabilitation Services Office:[phone] 08/16/2023   Marley Simmers 08/16/2023, 5:04 PM

## 2023-08-16 NOTE — Progress Notes (Signed)
 Speech Language Pathology Treatment: Dysphagia  Patient Details Name: Evan [name] MRN: [id number] DOB: Jun 10, 1927 Today's Date: 08/16/2023 Time: 4098-1191 SLP Time Calculation (min) (ACUTE ONLY): 19 min  Assessment / Plan / Recommendation Clinical Impression  Pt seen for ongoing dysphagia management.  Nursing and family report no difficulty with current diet.  Pt tolerated puree and nectar thick liquid by cup without difficulty.  Pt with anterior loss of puree from R 2/2 prior CVA. No clinical s/s of aspiration observed.  Reviewed imaging from MBSS in room with son.  He reports good intake of thickened liquids without pt expressing distaste.  Hopefully thickened liquid consistency will be a sustainable way to reduce risk of development of aspiration pna, without increased risk of dehydration given pt's response to change in consistency to date.  Discussed straws precaution, which is pt's preferred method of drinking.  Pt benefits from straw for oral containment. There was "only subtle penetration noted with large bolus via straw" with nectar/mildly thick liquid.  This is a risk for aspiration, but over the course of a meal cup sips may not be sustainable. Will loosen straw precaution as no aspiration was observed on MBS. Provided thickener ed to son including demonstration for IDDSI consistency testing.  Provided mildly thick 4oz packets, 10 ml syringe and writing instructions as well. Reviewed foods to avoid.  Pt really like milkshakes. Recommended [employer] frosty which melts to mildly/nectar thick consistency.  Avoid fruits which are watery/juicy.  Recommend encouraging cough with oranges which he likes to eat. Family did not have any additional questions.  SLP to follow up for diet tolerance/education.  Recommend ST at next level of care to reinforce.  Recommend continuing regular texture diet with nectar/mildly thick liquid.    HPI HPI: Evan [name] is a 89 y.o. male who presented with shortness  of breath and recent pneumonia diagnosis. CXR 5/11: "Patchy bibasilar reticulonodular opacities, favored slightly  increased since June 2024. Findings likely reflect a combination of  aspiration, atelectasis or infection superimposed on a background of  chronic scarring."  Pt known to this service from prior admission.  MBSS in Dec largely WFL with pna at that time, but had sick contacts.   MBSS in 2023 and 2020 as well all with recs to cont regular/thin, but with some precuations/compensatory strategies. Pt with medical history significant of hypertension, CKD 3B, CVA, CAD status post CABG and stent, GERD, anemia, chronic diastolic CHF, anxiety, depression, dementia, lung cancer, BPH, COPD.      SLP Plan  Continue with current plan of care      Recommendations for follow up therapy are one component of a multi-disciplinary discharge planning process, led by the attending physician.  Recommendations may be updated based on patient status, additional functional criteria and insurance authorization.    Recommendations  Diet recommendations: Regular;Nectar-thick liquid Liquids provided via: Cup;Straw Medication Administration: Whole meds with puree Supervision: Staff to assist with self feeding Compensations: Small sips/bites;Slow rate Postural Changes and/or Swallow Maneuvers: Seated upright 90 degrees                  Oral care BID     Dysphagia, oropharyngeal phase (R13.12)     Continue with current plan of care     Elester Grim, MA, CCC-SLP Acute Rehabilitation Services Office: [phone] 08/16/2023, 10:37 AM

## 2023-08-16 NOTE — Progress Notes (Signed)
 PROGRESS NOTE    Evan [name]  UJW:[id number] DOB: 11/17/1927 DOA: 08/13/2023 PCP: Austine Lefort, MD  89/M hypertension, CKD 3B, CVA, CAD status post CABG and stent, GERD, anemia, chronic diastolic CHF, anxiety, depression, dementia, lung cancer, BPH, COPD presenting with shortness of breath and recent pneumonia diagnosis. - had about a week of cough, body aches, tachycardia.  Was seen by his PCP 3 days ago and started on Augmentin  and azithromycin , had recurrence of fevers with increased work of breathing. - In the ER tachycardic, mildly hypoxic, creatinine 1.3, hemoglobin 9.9, no leukocytosis, BNP 587, troponin 21, chest x-ray with small bilateral effusions, patchy opacities in the background of chronic scarring - Admitted, started on antibiotics and diuretics, SLP eval  Subjective: No requiring oxygen  supplementation at time of examination; no chest pain, no nausea, no vomiting.  Complaining of itching and having loose stools.  Assessment and Plan:  Acute respiratory failure with hypoxia - Suspect combination of pneumonia primarily possibly aspiration and some volume overload - Continue to follow daily weights, strict I's and O's and provide supportive care - On today's examination no requiring oxygen  supplementation.  Bilateral pneumonia, suspect aspiration -Presenting with cough, myalgias, fevers - Continue supportive care and as needed bronchodilator -Positive metapneumovirus as well - SLP failed with concern for some oropharyngeal dysphagia, now on thickened liquids following their recommendations. - Patient demonstrating good saturation on room air at the moment - Will remain high risk for aspiration given underlying dementia - Antibiotics have been transitioned to oral Augmentin  to complete acute management.  Acute on chronic diastolic CHF -does not appear significantly volume overloaded at this time, continue metoprolol , continue oral diuretics. -Last echo 12/24 with  preserved EF, normal RV, moderately elevated PA systolic pressures - Conservative management on account of advanced age, frailty    History of COPD versus asthma - Continue the use of Dulera  while inpatient and as needed bronchodilator management.   History of lung cancer status post radiation History of radiation pneumonitis - Continue outpatient follow-up with oncology/pulmonologist.   Hypertension - Continue current antihypertensive agents.   Hyperlipidemia - Continue home rosuvastatin    GERD - Continue home PPI   CKD 3B > Creatinine stable in the ED at 1.35 - Maintain adequate hydration, minimize nephrotoxic agents and follow renal function trend.   History of CVA - Continue home Eliquis , rosuvastatin  for secondary prevention - Physical therapy evaluation requested by family.   History of atrial fibrillation - Continue home metoprolol  and the use of Eliquis    CAD > Status post CABG and stenting - Continue home metoprolol , rosuvastatin , Eliquis  -No chest pain.   History of QTc prolongation -Monitor -Continue minimizing agents that can further prolong QT - Specifically Zithromax  discontinue for this reason.   Dementia Depression -At risk of delirium and sundowning - Continue home fluoxetine ; as needed Atarax  to assist with anxiety and itching prescribed. -Constant reorientation and supportive care implemented.   History of DVT/PE - Continue Eliquis  for secondary prevention.  Itching - As needed Atarax  and topical Benadryl  prescribed.  Functional diarrhea - No abdominal pain and most likely diarrhea associated with the use of antibiotics - Florastor has been initiated - Patient advised to maintain adequate hydration - Antibiotics currently transition to oral route.   DVT prophylaxis:      Eliquis  Code Status:              Full> discussed with son and daughter yesterday, suggested consideration of DNR, they will think about this Family Communication:  Son updated at bedside  Disposition Plan: Home with family likely 24-48 hours  Objective: Vitals:   08/16/23 0505 08/16/23 0731 08/16/23 0919 08/16/23 1530  BP: 104/65 104/60  114/72  Pulse: 74 66 60 97  Resp: 19 16  18   Temp: 97.7 F (36.5 C) 98 F (36.7 C)  97.6 F (36.4 C)  TempSrc: Oral Oral  Oral  SpO2: 97% 96%  97%  Weight: 62.9 kg     Height:        Intake/Output Summary (Last 24 hours) at 08/16/2023 1706 Last data filed at 08/16/2023 1610 Gross per 24 hour  Intake 220 ml  Output 1550 ml  Net -1330 ml   Filed Weights   08/14/23 0535 08/15/23 0402 08/16/23 0505  Weight: 65 kg 64.2 kg 62.9 kg    Examination: General exam: Alert, awake, oriented x 2 (intermittently confused); chronically ill in appearance and expressing significant itching in his arm and back. Respiratory system: Positive rhonchi, mild expiratory wheezing and decreased breath sounds at the bases.  No using accessory muscles. Cardiovascular system: Irregular, no rubs, no gallops, positive murmur. Gastrointestinal system: Abdomen is nondistended, soft and nontender.  Positive bowel sounds. Central nervous system: Moving 4 limbs spontaneously; no focal neurological deficits. Extremities: No cyanosis or clubbing.   Skin: No petechiae. Psychiatry: Judgement and insight appear fair secondary to underlying dementia; slightly restless and anxious with ongoing situation.  Data Reviewed:   CBC: Recent Labs  Lab 08/10/23 1633 08/13/23 1020 08/14/23 0835 08/15/23 0317 08/16/23 0252  WBC 6.5 6.4 4.9 4.9 5.3  NEUTROABS 4,576 5.1  --   --   --   HGB 9.9* 9.9* 10.0* 9.3* 8.8*  HCT 28.9* 30.1* 30.1* 27.6* 25.9*  MCV 98.6 104.5* 102.7* 101.5* 100.0  PLT 108* 87* 79* 74* 72*   Basic Metabolic Panel: Recent Labs  Lab 08/10/23 1633 08/13/23 1020 08/13/23 1821 08/14/23 0835 08/15/23 0317 08/16/23 0252  NA 139 134*  --  134* 136 135  K 3.8 3.7  --  3.3* 3.6 3.3*  CL 106 103  --  101 105 105  CO2 18*  19*  --  25 20* 22  GLUCOSE 107* 108*  --  167* 105* 101*  BUN 33* 29*  --  23 25* 23  CREATININE 1.33* 1.35*  --  1.32* 1.32* 1.19  CALCIUM  8.9 8.8*  --  8.8* 8.4* 8.3*  MG  --   --  1.8  --   --   --    GFR: Estimated Creatinine Clearance: 30 mL/min (by C-G formula based on SCr of 1.19 mg/dL).  Liver Function Tests: Recent Labs  Lab 08/10/23 1633 08/14/23 0835  AST 16 25  ALT 10 10  ALKPHOS  --  75  BILITOT 0.6 0.7  PROT 6.7 6.6  ALBUMIN  --  2.9*    Urine analysis:    Component Value Date/Time   COLORURINE AMBER (A) 03/08/2023 1142   APPEARANCEUR CLOUDY (A) 03/08/2023 1142   LABSPEC 1.015 03/08/2023 1142   PHURINE 5.0 03/08/2023 1142   GLUCOSEU NEGATIVE 03/08/2023 1142   HGBUR MODERATE (A) 03/08/2023 1142   BILIRUBINUR NEGATIVE 03/08/2023 1142   KETONESUR NEGATIVE 03/08/2023 1142   PROTEINUR 100 (A) 03/08/2023 1142   UROBILINOGEN 0.2 07/13/2014 1804   NITRITE NEGATIVE 03/08/2023 1142   LEUKOCYTESUR LARGE (A) 03/08/2023 1142   Sepsis Labs: Recent Results (from the past 240 hours)  Resp panel by RT-PCR (RSV, Flu A&B, Covid) Anterior Nasal Swab  Status: None   Collection Time: 08/13/23  9:58 AM   Specimen: Anterior Nasal Swab  Result Value Ref Range Status   SARS Coronavirus 2 by RT PCR NEGATIVE NEGATIVE Final   Influenza A by PCR NEGATIVE NEGATIVE Final   Influenza B by PCR NEGATIVE NEGATIVE Final    Comment: (NOTE) The Xpert Xpress SARS-CoV-2/FLU/RSV plus assay is intended as an aid in the diagnosis of influenza from Nasopharyngeal swab specimens and should not be used as a sole basis for treatment. Nasal washings and aspirates are unacceptable for Xpert Xpress SARS-CoV-2/FLU/RSV testing.  Fact Sheet for Patients: BloggerCourse.com  Fact Sheet for Healthcare Providers: SeriousBroker.it  This test is not yet approved or cleared by the [place]  FDA and has been authorized for detection and/or  diagnosis of SARS-CoV-2 by FDA under an Emergency Use Authorization (EUA). This EUA will remain in effect (meaning this test can be used) for the duration of the COVID-19 declaration under Section 564(b)(1) of the Act, 21 U.S.C. section 360bbb-3(b)(1), unless the authorization is terminated or revoked.     Resp Syncytial Virus by PCR NEGATIVE NEGATIVE Final    Comment: (NOTE) Fact Sheet for Patients: BloggerCourse.com  Fact Sheet for Healthcare Providers: SeriousBroker.it  This test is not yet approved or cleared by the [place]  FDA and has been authorized for detection and/or diagnosis of SARS-CoV-2 by FDA under an Emergency Use Authorization (EUA). This EUA will remain in effect (meaning this test can be used) for the duration of the COVID-19 declaration under Section 564(b)(1) of the Act, 21 U.S.C. section 360bbb-3(b)(1), unless the authorization is terminated or revoked.  Performed at [facility], [address] [place], [place] [address]   Respiratory (~20 pathogens) panel by PCR     Status: Abnormal   Collection Time: 08/13/23  5:26 PM   Specimen: Nasopharyngeal Swab; Respiratory  Result Value Ref Range Status   Adenovirus NOT DETECTED NOT DETECTED Final   Coronavirus 229E NOT DETECTED NOT DETECTED Final    Comment: (NOTE) The Coronavirus on the Respiratory Panel, DOES NOT test for the novel  Coronavirus (2019 nCoV)    Coronavirus HKU1 NOT DETECTED NOT DETECTED Final   Coronavirus NL63 NOT DETECTED NOT DETECTED Final   Coronavirus OC43 NOT DETECTED NOT DETECTED Final   Metapneumovirus DETECTED (A) NOT DETECTED Final   Rhinovirus / Enterovirus NOT DETECTED NOT DETECTED Final   Influenza A NOT DETECTED NOT DETECTED Final   Influenza B NOT DETECTED NOT DETECTED Final   Parainfluenza Virus 1 NOT DETECTED NOT DETECTED Final   Parainfluenza Virus 2 NOT DETECTED NOT DETECTED Final   Parainfluenza Virus 3 NOT  DETECTED NOT DETECTED Final   Parainfluenza Virus 4 NOT DETECTED NOT DETECTED Final   Respiratory Syncytial Virus NOT DETECTED NOT DETECTED Final   Bordetella pertussis NOT DETECTED NOT DETECTED Final   Bordetella Parapertussis NOT DETECTED NOT DETECTED Final   Chlamydophila pneumoniae NOT DETECTED NOT DETECTED Final   Mycoplasma pneumoniae NOT DETECTED NOT DETECTED Final    Comment: Performed at [facility], [address], [place], [place] [address]     Radiology Studies: No results found.    Scheduled Meds:  amoxicillin -clavulanate  1 tablet Oral BID   apixaban   2.5 mg Oral BID   Chlorhexidine  Gluconate Cloth  6 each Topical Q0600   FLUoxetine   20 mg Oral Daily   furosemide   40 mg Oral Daily   Gerhardt's butt cream   Topical BID   ipratropium-albuterol   3  mL Nebulization BID   metoprolol  succinate  12.5 mg Oral Daily   mometasone -formoterol   2 puff Inhalation BID   nystatin   1 Application Topical BID   pantoprazole   40 mg Oral q morning   rosuvastatin   5 mg Oral QHS   saccharomyces boulardii  250 mg Oral BID   sodium chloride  flush  3 mL Intravenous Q12H   tamsulosin   0.4 mg Oral QHS   Continuous Infusions:     LOS: 2 days    Time spent:    Justina Oman MD [phone]  [facility]   08/16/2023, 5:06 PM

## 2023-08-17 DIAGNOSIS — J189 Pneumonia, unspecified organism: Secondary | ICD-10-CM | POA: Diagnosis not present

## 2023-08-17 DIAGNOSIS — I5033 Acute on chronic diastolic (congestive) heart failure: Secondary | ICD-10-CM | POA: Diagnosis not present

## 2023-08-17 DIAGNOSIS — I10 Essential (primary) hypertension: Secondary | ICD-10-CM | POA: Diagnosis not present

## 2023-08-17 DIAGNOSIS — I2510 Atherosclerotic heart disease of native coronary artery without angina pectoris: Secondary | ICD-10-CM | POA: Diagnosis not present

## 2023-08-17 MED ORDER — MELATONIN 3 MG PO TABS
3.0000 mg | ORAL_TABLET | Freq: Every evening | ORAL | Status: DC | PRN
  Administered 2023-08-17 – 2023-08-20 (×5): 3 mg via ORAL
  Filled 2023-08-17 (×5): qty 1

## 2023-08-17 MED ORDER — FUROSEMIDE 10 MG/ML IJ SOLN
60.0000 mg | Freq: Two times a day (BID) | INTRAMUSCULAR | Status: DC
  Administered 2023-08-17 – 2023-08-19 (×4): 60 mg via INTRAVENOUS
  Filled 2023-08-17 (×4): qty 6

## 2023-08-17 MED ORDER — POTASSIUM CHLORIDE 20 MEQ PO PACK
40.0000 meq | PACK | ORAL | Status: AC
  Administered 2023-08-17 (×2): 40 meq via ORAL
  Filled 2023-08-17 (×2): qty 2

## 2023-08-17 MED ORDER — IPRATROPIUM-ALBUTEROL 0.5-2.5 (3) MG/3ML IN SOLN
3.0000 mL | Freq: Four times a day (QID) | RESPIRATORY_TRACT | Status: DC | PRN
  Administered 2023-08-19: 3 mL via RESPIRATORY_TRACT
  Filled 2023-08-17: qty 3

## 2023-08-17 NOTE — Progress Notes (Signed)
 Progress Note   Patient: Evan [name] ZOX:[id number] DOB: 11-Aug-1927 DOA: 08/13/2023     3 DOS: the patient was seen and examined on 08/17/2023   Brief hospital course: 89/M hypertension, CKD 3B, CVA, CAD status post CABG and stent, GERD, anemia, chronic diastolic CHF, anxiety, depression, dementia, lung cancer, BPH, COPD presenting with shortness of breath and recent pneumonia diagnosis. - had about a week of cough, body aches, tachycardia.  Was seen by his PCP 3 days ago and started on Augmentin  and azithromycin , had recurrence of fevers with increased work of breathing. - In the ER tachycardic, mildly hypoxic, creatinine 1.3, hemoglobin 9.9, no leukocytosis, BNP 587, troponin 21,   Chest radiograph with hypoinflation, cardiomegaly, bilateral hilar vascular congestion with interstitial infiltrates at lower zones with basilar atelectasis bilaterally, sternotomy wires in place.   - Admitted, started on antibiotics and diuretics, SLP eval  Assessment and Plan: * Acute on chronic diastolic CHF (congestive heart failure) (HCC) Echocardiogram with preserved LV systolic function with EF 60 to 65%, RV systolic function preserved, RVSP 54,8 mmHg, LA with severe dilatation, RA with moderate dilatation, mild MR, moderate TR,   Urine output is 2,250 ml  Systolic blood pressure 110 mmHg.   Plan to continue diuresis with furosemide , will change to IV 60 mg bid for now.  Continue with metoprolol .   Bilateral pneumonia Continue antibiotic therapy with Augmentin  Continue aspiration precautions and thick liquids. Patient very weak and deconditioned, consult nutrition  Continue Pt and Ot.   CAD (coronary artery disease) No chest pain, continue blood pressure control.   Essential hypertension Continue blood pressure control with metoprolol    CKD stage 3b, GFR 30-44 ml/min (HCC) - baseline SCr 1.2-1.5 Hypokalemia and hypomagnesemia.   Renal function with serum cr at 1.19 with K at 3,3 and serum  bicarbonate at 22  Na 135   Add 40 meq kcl x2 and follow up renal function and electrolytes.  Avoid hypotension and nephrotoxic medications   Hemiparesis due to old stroke (HCC) Limited mobility.  Continue statin and blood pressure control.   Anemia Cell count has been stabe  Protein-calorie malnutrition, severe Continue nutritional supplements.,   Depression Continue fluoxetine    Chronic indwelling Foley catheter Foley care per protocol.  Continue tamsulosin  for BPH.   COPD (chronic obstructive pulmonary disease) (HCC) No signs of exacerbation  Continue bronchodilator therapy   History of pulmonary embolism Continue anticoagulation with apixaban .         Subjective: patient with no chest pain, his dyspnea has been improving, but continue to have wheezing.   Physical Exam: Vitals:   08/17/23 0016 08/17/23 0306 08/17/23 0400 08/17/23 0850  BP: 128/75 110/70  111/61  Pulse: 85 98  93  Resp: 18 19  (!) 24  Temp: 98.6 F (37 C) 97.7 F (36.5 C)  (!) 97.3 F (36.3 C)  TempSrc: Oral Oral  Oral  SpO2: 94% 98%    Weight:   62.4 kg   Height:       Neurology awake and alert ENT with mild pallor with no icterus Cardiovascular with S1 and S2 present and regular with no gallops or rubs Respiratory with mild expiratory wheezing and scattered rhonchi and rales, poor inspiratory effort. Abdomen with no distention  No lower extremity edema   Data Reviewed:    Family Communication: I spoke with patient's family  at the bedside, we talked in detail about patient's condition, plan of care and prognosis and all questions were addressed.   Disposition:  Status is: Inpatient Remains inpatient appropriate because: Iv diuretics   Planned Discharge Destination: Home      Author: Albertus Alt, MD 08/17/2023 9:19 AM  For on call review [email or web address].

## 2023-08-17 NOTE — Hospital Course (Addendum)
 Evan [name] was admitted to the hospital with the working diagnosis of acute decompensation of heart failure in the setting of aspiration pneumonia/ metapneumovirus infection.   89/M hypertension, CKD 3B, CVA, CAD status post CABG and stent, GERD, anemia, chronic diastolic CHF, anxiety, depression, dementia, lung cancer, BPH, COPD presenting with worsening dyspnea.  Reported one week of cough, body aches, tachycardia. 3 days prior to admission   he was diagnosed with pneumonia. He was placed on Augmentin  and azithromycin . Unfortunately at home he had recurrence of fevers with increased work of breathing. On his initial physical examination his blood pressure was 123/76, HR 90, RR 18 and 02 saturation 100%  Deconditioned and ill looking appearing, lungs with bilateral rales with no wheezing, heart with S1 and S2 present and tachycardic, abdomen with no distention and no lower extremity edema.   Na 134, K 3,7 Cl 103 bicarbonate 19 glucose 108 bun 29 cr 1,35  BNP 587  High sensitive troponin 21  Wbc 6,4 hgb 9,9 plt 87   Respiratory panel positive for metapneumovirus  Sars covid 19 negative Influenza negative RSV negative   Chest radiograph with hypoinflation, cardiomegaly, bilateral hilar vascular congestion with interstitial infiltrates at lower zones with basilar atelectasis bilaterally, sternotomy wires in place.   EKG 109 bpm, left axis deviation, left anterior fascicular block, qtc 483, junctional rhythm with no ST segment changes, negative T wace lead I, aVL, V1 -V3  Admitted, started on antibiotics and diuretics, SLP eval Diuretics.   05/16 clinically improving. Very weak and deconditioned.  05/17 pending renal function recovery, plan for discharge home on Monday  05/19 continue to improve respiratory status, plan to discharge home and continue home health services.

## 2023-08-17 NOTE — Assessment & Plan Note (Signed)
Continue anticoagulation with apixaban.  ?

## 2023-08-17 NOTE — Assessment & Plan Note (Signed)
 No signs of exacerbation  Continue bronchodilator therapy

## 2023-08-17 NOTE — Assessment & Plan Note (Signed)
 Foley care per protocol.  Continue tamsulosin  for BPH.

## 2023-08-17 NOTE — Assessment & Plan Note (Addendum)
 Hypokalemia and hypomagnesemia. Hyponatremia.  His volume status has improved, renal function stable with serum cr at 1,42 with K at 3,7 and serum bicarbonate at 21  Na 135 Mg 2.0   Continue furosemide  40 mg po daily and follow up renal function as outpatient.

## 2023-08-17 NOTE — Care Management Important Message (Signed)
 Important Message  Patient Details  Name: Evan [name] MRN: [id number] Date of Birth: 02-Sep-1927   Important Message Given:  Yes - Medicare IM     Janith Melnick 08/17/2023, 9:20 AM

## 2023-08-17 NOTE — Assessment & Plan Note (Signed)
 Continue blood pressure control with metoprolol.

## 2023-08-17 NOTE — Progress Notes (Signed)
 [facility] night cross cover note:   I was notified by RN of the patient's request for a sleep aid. I subsequently placed order for prn melatonin for insomnia.     Newton Pigg, DO Hospitalist

## 2023-08-17 NOTE — Assessment & Plan Note (Addendum)
 Metapneumovirus  Completed antibiotic therapy with Augmentin  Continue aspiration precautions and thick liquids. Continue home health services with pt and ot.

## 2023-08-17 NOTE — Assessment & Plan Note (Addendum)
 Echocardiogram with preserved LV systolic function with EF 60 to 65%, RV systolic function preserved, RVSP 54,8 mmHg, LA with severe dilatation, RA with moderate dilatation, mild MR, moderate TR,   Acute on chronic core pulmonale Pulmonary hypertension  Patient was placed on furosemide  for diuresis, negative fluid balance was achieved, -10,673 ml, with significant improvement in his symptoms.   Plan to continue with metoprolol  and will resume furosemide .  No SGLT 2 inh due to chronic foley catheter and risk of urinary tract infections.  Limited pharmacologic therapy due to acutely reduced GFR.

## 2023-08-17 NOTE — Plan of Care (Signed)

## 2023-08-17 NOTE — Assessment & Plan Note (Signed)
 Cell count has been stabe

## 2023-08-17 NOTE — Assessment & Plan Note (Signed)
No chest pain, continue blood pressure control.  

## 2023-08-17 NOTE — Assessment & Plan Note (Signed)
 Limited mobility.  Continue statin and blood pressure control.

## 2023-08-17 NOTE — Assessment & Plan Note (Signed)
 Continue nutritional supplements.,

## 2023-08-17 NOTE — Assessment & Plan Note (Signed)
 Continue fluoxetine

## 2023-08-18 DIAGNOSIS — J189 Pneumonia, unspecified organism: Secondary | ICD-10-CM | POA: Diagnosis not present

## 2023-08-18 DIAGNOSIS — I2510 Atherosclerotic heart disease of native coronary artery without angina pectoris: Secondary | ICD-10-CM | POA: Diagnosis not present

## 2023-08-18 DIAGNOSIS — I5033 Acute on chronic diastolic (congestive) heart failure: Secondary | ICD-10-CM | POA: Diagnosis not present

## 2023-08-18 DIAGNOSIS — I10 Essential (primary) hypertension: Secondary | ICD-10-CM | POA: Diagnosis not present

## 2023-08-18 LAB — CBC
HCT: 30.5 % — ABNORMAL LOW (ref 39.0–52.0)
Hemoglobin: 10.3 g/dL — ABNORMAL LOW (ref 13.0–17.0)
MCH: 34 pg (ref 26.0–34.0)
MCHC: 33.8 g/dL (ref 30.0–36.0)
MCV: 100.7 fL — ABNORMAL HIGH (ref 80.0–100.0)
Platelets: 111 10*3/uL — ABNORMAL LOW (ref 150–400)
RBC: 3.03 MIL/uL — ABNORMAL LOW (ref 4.22–5.81)
RDW: 13.9 % (ref 11.5–15.5)
WBC: 6 10*3/uL (ref 4.0–10.5)
nRBC: 0 % (ref 0.0–0.2)

## 2023-08-18 LAB — BASIC METABOLIC PANEL WITH GFR
Anion gap: 11 (ref 5–15)
BUN: 30 mg/dL — ABNORMAL HIGH (ref 8–23)
CO2: 22 mmol/L (ref 22–32)
Calcium: 9.3 mg/dL (ref 8.9–10.3)
Chloride: 103 mmol/L (ref 98–111)
Creatinine, Ser: 1.39 mg/dL — ABNORMAL HIGH (ref 0.61–1.24)
GFR, Estimated: 47 mL/min — ABNORMAL LOW (ref >60)
Glucose, Bld: 113 mg/dL — ABNORMAL HIGH (ref 70–99)
Potassium: 4.5 mmol/L (ref 3.5–5.1)
Sodium: 136 mmol/L (ref 135–145)

## 2023-08-18 LAB — MAGNESIUM: Magnesium: 2 mg/dL (ref 1.7–2.4)

## 2023-08-18 NOTE — Progress Notes (Addendum)
 Progress Note   Patient: Evan [name]:[id number] DOB: 1927-07-04 DOA: 08/13/2023     4 DOS: the patient was seen and examined on 08/18/2023   Brief hospital course: Mr. Bernosky was admitted to the hospital with the working diagnosis of acute decompensation of heart failure in the setting of aspiration pneumonia.   89/M hypertension, CKD 3B, CVA, CAD status post CABG and stent, GERD, anemia, chronic diastolic CHF, anxiety, depression, dementia, lung cancer, BPH, COPD presenting with worsening dyspnea.  Reported one week of cough, body aches, tachycardia. 3 days prior to admission   he was diagnosed with pneumonia. He was placed on Augmentin  and azithromycin . Unfortunately at home he had recurrence of fevers with increased work of breathing. On his initial physical examination his blood pressure was 123/76, HR 90, RR 18 and 02 saturation 100%  Deconditioned and ill looking appearing, lungs with bilateral rales with no wheezing, heart with S1 and S2 present and tachycardic, abdomen with no distention and no lower extremity edema.   Na 134, K 3,7 Cl 103 bicarbonate 19 glucose 108 bun 29 cr 1,35  BNP 587  High sensitive troponin 21  Wbc 6,4 hgb 9,9 plt 87   Respiratory panel positive for metapneumovirus  Sars covid 19 negative Influenza negative RSV negative   Chest radiograph with hypoinflation, cardiomegaly, bilateral hilar vascular congestion with interstitial infiltrates at lower zones with basilar atelectasis bilaterally, sternotomy wires in place.   EKG 109 bpm, left axis deviation, left anterior fascicular block, qtc 483, junctional rhythm with no ST segment changes, negative T wace lead I, aVL, V1 -V3  Admitted, started on antibiotics and diuretics, SLP eval Diuretics.   05/16 clinically improving. Very weak and deconditioned.   Assessment and Plan: * Acute on chronic diastolic CHF (congestive heart failure) (HCC) Echocardiogram with preserved LV systolic function with EF 60 to  65%, RV systolic function preserved, RVSP 54,8 mmHg, LA with severe dilatation, RA with moderate dilatation, mild MR, moderate TR,   Urine output is 3000 ml  Systolic blood pressure 110 mmHg.   Plan to continue diuresis with furosemide  IV 60 mg bid for now.  Continue with metoprolol .   Bilateral pneumonia Completed antibiotic therapy with Augmentin  Continue aspiration precautions and thick liquids. Patient very weak and deconditioned, consult nutrition  Continue Pt and Ot.   CAD (coronary artery disease) No chest pain, continue blood pressure control.   Essential hypertension Continue blood pressure control with metoprolol    CKD stage 3b, GFR 30-44 ml/min (HCC) - baseline SCr 1.2-1.5 Hypokalemia and hypomagnesemia.   Renal function today with serum cr at 1,39 with K at 4,5 and serum bicarbonate at 22  Na 136 and Mg 2.0   Continue diuresis with furosemide  and follow up renal function and electrolytes in am.   Hemiparesis due to old stroke (HCC) Limited mobility.  Continue statin and blood pressure control.   Anemia Cell count has been stabe  Protein-calorie malnutrition, severe Continue nutritional supplements.,   Depression Continue fluoxetine    Chronic indwelling Foley catheter Foley care per protocol.  Continue tamsulosin  for BPH.   COPD (chronic obstructive pulmonary disease) (HCC) No signs of exacerbation  Continue bronchodilator therapy   History of pulmonary embolism Continue anticoagulation with apixaban .         Subjective: Patient is feeling better, dyspnea and wheezing has improved, no chest pain, continue very weak and deconditioned   Physical Exam: Vitals:   08/17/23 2032 08/18/23 0138 08/18/23 0439 08/18/23 0723  BP:  124/74 127/75  130/67  Pulse:  70 64 64  Resp:  19 19 20   Temp:  97.6 F (36.4 C) (!) 97.4 F (36.3 C)   TempSrc:  Axillary Axillary   SpO2: 96% 98% 97% 98%  Weight:   62.6 kg   Height:       Neurology awake and alert,  deconditioned  ENT with mild pallor Cardiovascular with S1 and S2 present and regular with no gallops or rubs, positive systolic murmur at the apex Respiratory with no mild rales at bases with prolonged expiratory phase with no wheezing or rhonchi  Abdomen with no distention  No lower extremity edema  Foley in place  Data Reviewed:    Family Communication: I spoke with patient's family at the bedside, we talked in detail about patient's condition, plan of care and prognosis and all questions were addressed.   Disposition: Status is: Inpatient Remains inpatient appropriate because: recovering from respiratory failure  Planned Discharge Destination: Home    Author: Albertus Alt, MD 08/18/2023 3:17 PM  For on call review [email or web address].

## 2023-08-18 NOTE — Progress Notes (Signed)
 Speech Language Pathology Treatment: Dysphagia  Patient Details Name: Evan [name] MRN: [id number] DOB: [birth year] Today's Date: 08/18/2023 Time: 1000-1020 SLP Time Calculation (min) (ACUTE ONLY): 20 min  Assessment / Plan / Recommendation Clinical Impression  Pt pleasant, son at bedside. SLP reiterated results. Son again reports pt is tolerating nectar without complaint. Doesn't appear to notice. SLP trialed thin with delayed throat clear. Pt with recurrent pna and inconsistent sensation of aspiration, so thickened liquids are still the best option to reduce risk of exacerbating pna until pt is stronger.  Advised son to continue nectar thick liquids for a few weeks at least, or longer if pt remains unaware of change. Warned him to beware of pt refusing drinks or becoming at risk of dehydration, which would be a reason to resume thin liquids regardless of risk. Pt could come in as an OP to retest swallowing if his strength and activity level improve. Son is in agreement. SLP reviewed importance of oral hygiene as well in preventing aspiration pna. Son verbalized understanding of thickening; has already ordered some thickener. Also ok for pt to use straw. Son aware of pt needed pacing. No need for further acute interventions. Education complete and pt stable.   HPI HPI: Evan [name] is a 89 y.o. male who presented with shortness of breath and recent pneumonia diagnosis. CXR 5/11: "Patchy bibasilar reticulonodular opacities, favored slightly  increased since June 2024. Findings likely reflect a combination of  aspiration, atelectasis or infection superimposed on a background of  chronic scarring."  Pt known to this service from prior admission.  MBSS in Dec largely WFL with pna at that time, but had sick contacts.   MBSS in 2023 and 2020 as well all with recs to cont regular/thin, but with some precuations/compensatory strategies. Pt with medical history significant of hypertension, CKD 3B, CVA, CAD status  post CABG and stent, GERD, anemia, chronic diastolic CHF, anxiety, depression, dementia, lung cancer, BPH, COPD.      SLP Plan  All goals met      Recommendations for follow up therapy are one component of a multi-disciplinary discharge planning process, led by the attending physician.  Recommendations may be updated based on patient status, additional functional criteria and insurance authorization.    Recommendations  Diet recommendations: Regular;Nectar-thick liquid Liquids provided via: Straw Medication Administration: Whole meds with puree Supervision: Trained caregiver to feed patient;Full supervision/cueing for compensatory strategies Compensations: Small sips/bites;Slow rate Postural Changes and/or Swallow Maneuvers: Seated upright 90 degrees                  Oral care BID     Dysphagia, oropharyngeal phase (R13.12)     All goals met     Janautica Netzley, Hardin Leys  08/18/2023, 10:51 AM

## 2023-08-18 NOTE — Progress Notes (Signed)
 Physical Therapy Treatment Patient Details Name: Evan [name] MRN: [id number] DOB: [birth year] Today's Date: 08/18/2023   History of Present Illness 89 y.o. male admitted 08/13/23 with SOB and hypoxia requiring 2-4L O2.  CXR positive for small bilateral effusions and BNP elevated.   PMH: CAD sp CABG, systolic CHF, BPH, esophageal ulcer, GERD, HLD, HTN, CKD 3B, lung CA, dementia, hx of subdural hematoma in the past, HOH, thrombocytopenia.    PT Comments  Patient progressing with ambulation distance and balance using bilateral platform walker which he is used to using at home.  Still fatigues quickly though SpO2 >90 on RA with HR elevation to 124.  Patient incontinent of bowel and RN in to help with hygiene.  Feel he will continue to benefit from skilled PT in the acute setting.  Follow up HHPT remains appropriate.     If plan is discharge home, recommend the following: A lot of help with walking and/or transfers;A lot of help with bathing/dressing/bathroom;Assist for transportation;Help with stairs or ramp for entrance   Can travel by private vehicle        Equipment Recommendations  None recommended by PT    Recommendations for Other Services       Precautions / Restrictions Precautions Precautions: Fall Required Braces or Orthoses: Other Brace Other Brace: R LE metal upright AFO from home Restrictions Weight Bearing Restrictions Per Provider Order: No     Mobility  Bed Mobility Overal bed mobility: Needs Assistance Bed Mobility: Supine to Sit, Sit to Supine     Supine to sit: Min assist, HOB elevated, Used rails Sit to supine: Mod assist, +2 for safety/equipment   General bed mobility comments: lifting trunk on his own, assist to scoot hips; to supine assist for legs and trunk with RN in the room    Transfers Overall transfer level: Needs assistance Equipment used: Bilateral platform walker Transfers: Sit to/from Stand Sit to Stand: Mod assist           General  transfer comment: lifting help to stand from EOB and chair without armrests    Ambulation/Gait Ambulation/Gait assistance: Mod assist Gait Distance (Feet): 10 Feet (x 2) Assistive device: Bilateral platform walker (R metal upright AFO) Gait Pattern/deviations: Step-to pattern, Decreased stride length, Wide base of support, Shuffle, Ataxic, Scissoring       General Gait Details: able to balance with PFRW with support posteriorly, cues for increased distance and A for RW.  seated rest in the room, then RN in to help with hygiene due to soiled from BM, then walked back to bed   Stairs             Wheelchair Mobility     Tilt Bed    Modified Rankin (Stroke Patients Only)       Balance Overall balance assessment: Needs assistance Sitting-balance support: Feet supported Sitting balance-[name] Scale: Fair     Standing balance support: Bilateral upper extremity supported Standing balance-[name] Scale: Poor Standing balance comment: min to mod A for posterior bias                            Communication Communication Communication: Impaired Factors Affecting Communication: Hearing impaired  Cognition Arousal: Alert Behavior During Therapy: WFL for tasks assessed/performed   PT - Cognitive impairments: History of cognitive impairments                         Following commands:  Intact      Cueing Cueing Techniques: Verbal cues, Gestural cues  Exercises      General Comments General comments (skin integrity, edema, etc.): daughter helped to don shoes and socks while PT out to get bilateral platform walker.  SpO2 90's or above, though HR up to 124 with mobility.      Pertinent Vitals/Pain Pain Assessment Pain Assessment: No/denies pain    Home Living                          Prior Function            PT Goals (current goals can now be found in the care plan section) Progress towards PT goals: Progressing toward goals     Frequency    Min 2X/week      PT Plan      Co-evaluation              AM-PAC PT "6 Clicks" Mobility   Outcome Measure  Help needed turning from your back to your side while in a flat bed without using bedrails?: A Lot Help needed moving from lying on your back to sitting on the side of a flat bed without using bedrails?: A Lot Help needed moving to and from a bed to a chair (including a wheelchair)?: A Lot Help needed standing up from a chair using your arms (e.g., wheelchair or bedside chair)?: A Lot Help needed to walk in hospital room?: Total Help needed climbing 3-5 steps with a railing? : Total 6 Click Score: 10    End of Session Equipment Utilized During Treatment: Gait belt Activity Tolerance: Patient limited by fatigue Patient left: in bed;with call bell/phone within reach;with family/visitor present   PT Visit Diagnosis: Other abnormalities of gait and mobility (R26.89);Ataxic gait (R26.0)     Time: 4098-1191 PT Time Calculation (min) (ACUTE ONLY): 30 min  Charges:    $Gait Training: 8-22 mins $Therapeutic Activity: 8-22 mins PT General Charges $$ ACUTE PT VISIT: 1 Visit                     Abigail Hoff, PT Acute Rehabilitation Services [phone] 08/18/2023    Evan [name] 08/18/2023, 4:55 PM

## 2023-08-19 DIAGNOSIS — I10 Essential (primary) hypertension: Secondary | ICD-10-CM | POA: Diagnosis not present

## 2023-08-19 DIAGNOSIS — I5033 Acute on chronic diastolic (congestive) heart failure: Secondary | ICD-10-CM | POA: Diagnosis not present

## 2023-08-19 DIAGNOSIS — J189 Pneumonia, unspecified organism: Secondary | ICD-10-CM | POA: Diagnosis not present

## 2023-08-19 DIAGNOSIS — I2510 Atherosclerotic heart disease of native coronary artery without angina pectoris: Secondary | ICD-10-CM | POA: Diagnosis not present

## 2023-08-19 LAB — BASIC METABOLIC PANEL WITH GFR
Anion gap: 12 (ref 5–15)
BUN: 35 mg/dL — ABNORMAL HIGH (ref 8–23)
CO2: 22 mmol/L (ref 22–32)
Calcium: 9.3 mg/dL (ref 8.9–10.3)
Chloride: 100 mmol/L (ref 98–111)
Creatinine, Ser: 1.42 mg/dL — ABNORMAL HIGH (ref 0.61–1.24)
GFR, Estimated: 46 mL/min — ABNORMAL LOW (ref >60)
Glucose, Bld: 101 mg/dL — ABNORMAL HIGH (ref 70–99)
Potassium: 4.2 mmol/L (ref 3.5–5.1)
Sodium: 134 mmol/L — ABNORMAL LOW (ref 135–145)

## 2023-08-19 NOTE — Plan of Care (Signed)

## 2023-08-19 NOTE — Plan of Care (Signed)

## 2023-08-19 NOTE — Progress Notes (Signed)
 Progress Note   Patient: Evan [name] ZOX:[id number] DOB: April 16, 1927 DOA: 08/13/2023     5 DOS: the patient was seen and examined on 08/19/2023   Brief hospital course: Evan [name] was admitted to the hospital with the working diagnosis of acute decompensation of heart failure in the setting of aspiration pneumonia.   89/M hypertension, CKD 3B, CVA, CAD status post CABG and stent, GERD, anemia, chronic diastolic CHF, anxiety, depression, dementia, lung cancer, BPH, COPD presenting with worsening dyspnea.  Reported one week of cough, body aches, tachycardia. 3 days prior to admission   he was diagnosed with pneumonia. He was placed on Augmentin  and azithromycin . Unfortunately at home he had recurrence of fevers with increased work of breathing. On his initial physical examination his blood pressure was 123/76, HR 90, RR 18 and 02 saturation 100%  Deconditioned and ill looking appearing, lungs with bilateral rales with no wheezing, heart with S1 and S2 present and tachycardic, abdomen with no distention and no lower extremity edema.   Na 134, K 3,7 Cl 103 bicarbonate 19 glucose 108 bun 29 cr 1,35  BNP 587  High sensitive troponin 21  Wbc 6,4 hgb 9,9 plt 87   Respiratory panel positive for metapneumovirus  Sars covid 19 negative Influenza negative RSV negative   Chest radiograph with hypoinflation, cardiomegaly, bilateral hilar vascular congestion with interstitial infiltrates at lower zones with basilar atelectasis bilaterally, sternotomy wires in place.   EKG 109 bpm, left axis deviation, left anterior fascicular block, qtc 483, junctional rhythm with no ST segment changes, negative T wace lead I, aVL, V1 -V3  Admitted, started on antibiotics and diuretics, SLP eval Diuretics.   05/16 clinically improving. Very weak and deconditioned.  05/17 pending renal function recovery, plan for discharge home on Monday   Assessment and Plan: * Acute on chronic diastolic CHF (congestive heart  failure) (HCC) Echocardiogram with preserved LV systolic function with EF 60 to 65%, RV systolic function preserved, RVSP 54,8 mmHg, LA with severe dilatation, RA with moderate dilatation, mild MR, moderate TR,   Urine output is 2,450 ml  Systolic blood pressure 110 mmHg.   Continue with metoprolol , hold on furosemide  for now.    Bilateral pneumonia Metapneumovirus  Completed antibiotic therapy with Augmentin  Continue aspiration precautions and thick liquids. Patient very weak and deconditioned, consult nutrition  Continue Pt and Ot.   CAD (coronary artery disease) No chest pain, continue blood pressure control.   Essential hypertension Continue blood pressure control with metoprolol    CKD stage 3b, GFR 30-44 ml/min (HCC) - baseline SCr 1.2-1.5 Hypokalemia and hypomagnesemia.   Renal function with serum cr at 1,42 with K at 4.2 and serum bicarbonate at 22  Na 134    Hold on furosemide  for today and follow up renal function and electrolytes in am.  Avoid hypotension or nephrotoxic medications.   Hemiparesis due to old stroke [facility] Limited mobility.  Continue statin and blood pressure control.   Anemia Cell count has been stabe  Protein-calorie malnutrition, severe Continue nutritional supplements.,   Depression Continue fluoxetine    Chronic indwelling Foley catheter Foley care per protocol.  Continue tamsulosin  for BPH.   COPD (chronic obstructive pulmonary disease) (HCC) No signs of exacerbation  Continue bronchodilator therapy   History of pulmonary embolism Continue anticoagulation with apixaban .         Subjective: Patient is out bed to chair, feeling better, dyspnea has improved,   Physical Exam: Vitals:   08/19/23 0415 08/19/23 0851 08/19/23 0919 08/19/23 1300  BP: (!) 114/56 109/66  113/71  Pulse: 64 65  (!) 107  Resp: (!) 23 17  (!) 23  Temp: 97.7 F (36.5 C)   (!) 97 F (36.1 C)  TempSrc: Axillary   Oral  SpO2: 96% 96% 99% 96%  Weight:  58.6 kg     Height:       Neurology awake and alert, deconditioned ENT with mild pallor Cardiovascular with S1 and S2 present and regular with no gallops or rubs, positive systolic murmur at the base No JVD No lower extremity edema Respiratory with prolonged expiratory phase with decreased breath sounds with no wheezing or rhonchi  Abdomen with no distention   Data Reviewed:    Family Communication: /I spoke with patient's family at the bedside, we talked in detail about patient's condition, plan of care and prognosis and all questions were addressed.   Disposition: Status is: Inpatient Remains inpatient appropriate because: recovering heart failure and pneumonia, discharge home on Monday   Planned Discharge Destination: Home     Author: Albertus Alt, MD 08/19/2023 2:49 PM  For on call review [email or web address].

## 2023-08-20 DIAGNOSIS — J189 Pneumonia, unspecified organism: Secondary | ICD-10-CM | POA: Diagnosis not present

## 2023-08-20 DIAGNOSIS — I5033 Acute on chronic diastolic (congestive) heart failure: Secondary | ICD-10-CM | POA: Diagnosis not present

## 2023-08-20 DIAGNOSIS — I10 Essential (primary) hypertension: Secondary | ICD-10-CM | POA: Diagnosis not present

## 2023-08-20 DIAGNOSIS — I2510 Atherosclerotic heart disease of native coronary artery without angina pectoris: Secondary | ICD-10-CM | POA: Diagnosis not present

## 2023-08-20 NOTE — Plan of Care (Signed)
   Problem: Clinical Measurements: Goal: Ability to maintain clinical measurements within normal limits will improve Outcome: Progressing Goal: Diagnostic test results will improve Outcome: Progressing Goal: Respiratory complications will improve Outcome: Progressing Goal: Cardiovascular complication will be avoided Outcome: Progressing

## 2023-08-20 NOTE — Plan of Care (Signed)

## 2023-08-20 NOTE — Progress Notes (Signed)
 Progress Note   Patient: Evan [name] ZOX:[id number] DOB: 1927-07-02 DOA: 08/13/2023     6 DOS: the patient was seen and examined on 08/20/2023   Brief hospital course: Evan [name] was admitted to the hospital with the working diagnosis of acute decompensation of heart failure in the setting of aspiration pneumonia.   89/M hypertension, CKD 3B, CVA, CAD status post CABG and stent, GERD, anemia, chronic diastolic CHF, anxiety, depression, dementia, lung cancer, BPH, COPD presenting with worsening dyspnea.  Reported one week of cough, body aches, tachycardia. 3 days prior to admission   he was diagnosed with pneumonia. He was placed on Augmentin  and azithromycin . Unfortunately at home he had recurrence of fevers with increased work of breathing. On his initial physical examination his blood pressure was 123/76, HR 90, RR 18 and 02 saturation 100%  Deconditioned and ill looking appearing, lungs with bilateral rales with no wheezing, heart with S1 and S2 present and tachycardic, abdomen with no distention and no lower extremity edema.   Na 134, K 3,7 Cl 103 bicarbonate 19 glucose 108 bun 29 cr 1,35  BNP 587  High sensitive troponin 21  Wbc 6,4 hgb 9,9 plt 87   Respiratory panel positive for metapneumovirus  Sars covid 19 negative Influenza negative RSV negative   Chest radiograph with hypoinflation, cardiomegaly, bilateral hilar vascular congestion with interstitial infiltrates at lower zones with basilar atelectasis bilaterally, sternotomy wires in place.   EKG 109 bpm, left axis deviation, left anterior fascicular block, qtc 483, junctional rhythm with no ST segment changes, negative T wace lead I, aVL, V1 -V3  Admitted, started on antibiotics and diuretics, SLP eval Diuretics.   05/16 clinically improving. Very weak and deconditioned.  05/17 pending renal function recovery, plan for discharge home on Monday   Assessment and Plan: * Acute on chronic diastolic CHF (congestive heart  failure) (HCC) Echocardiogram with preserved LV systolic function with EF 60 to 65%, RV systolic function preserved, RVSP 54,8 mmHg, LA with severe dilatation, RA with moderate dilatation, mild MR, moderate TR,   Acute on chronic core pulmonale Pulmonary hypertension  Urine output is 1,625  ml  Systolic blood pressure 110 mmHg.   Continue with metoprolol  Hold on diuretic therapy due to rise in serum cr   Bilateral pneumonia Metapneumovirus  Completed antibiotic therapy with Augmentin  Continue aspiration precautions and thick liquids. Patient very weak and deconditioned, consult nutrition  Continue Pt and Ot.   CAD (coronary artery disease) No chest pain, continue blood pressure control.   Essential hypertension Continue blood pressure control with metoprolol    CKD stage 3b, GFR 30-44 ml/min (HCC) - baseline SCr 1.2-1.5 Hypokalemia and hypomagnesemia. Hyponatremia.  Renal function with serum cr at 1,42 with K at 4,2 and serum bicarbonate at 22  Na 134    Hold on furosemide  for today and follow up renal function and electrolytes in am.  Avoid hypotension or nephrotoxic medications.   Hemiparesis due to old stroke [facility]) Limited mobility.  Continue statin and blood pressure control.   Anemia Cell count has been stabe  Protein-calorie malnutrition, severe Continue nutritional supplements.,   Depression Continue fluoxetine    Chronic indwelling Foley catheter Foley care per protocol.  Continue tamsulosin  for BPH.   COPD (chronic obstructive pulmonary disease) (HCC) No signs of exacerbation  Continue bronchodilator therapy   History of pulmonary embolism Continue anticoagulation with apixaban .         Subjective: Patient with no chest pain, dyspnea continue to improve, tolerating po well and  able to rest better over the last 24 hrs.   Physical Exam: Vitals:   08/20/23 0051 08/20/23 0445 08/20/23 0723 08/20/23 0859  BP: 94/63 104/66 (!) 95/57   Pulse: 98 87  74   Resp: 19 18 20    Temp: 97.6 F (36.4 C) 97.6 F (36.4 C) 97.7 F (36.5 C)   TempSrc: Oral Oral Oral   SpO2: 98% 93% 99% 96%  Weight:  58.4 kg    Height:       Neurology awake and alert ENT with mild pallor  Cardiovascular with S1 and S2 present and regular with no gallops., rubs, positive systolic murmur a the right lower sternal border.  Respiratory with prolonged expiratory phase with scattered rhonchi and decreased breath sounds  Abdomen with no distention  Data Reviewed:    Family Communication: I spoke with patient's family at the bedside, we talked in detail about patient's condition, plan of care and prognosis and all questions were addressed.   Disposition: Status is: Inpatient Remains inpatient appropriate because: recovering renal function   Planned Discharge Destination: Home    Author: Albertus Alt, MD 08/20/2023 3:51 PM  For on call review [email or web address].

## 2023-08-21 ENCOUNTER — Other Ambulatory Visit (HOSPITAL_COMMUNITY): Payer: Self-pay

## 2023-08-21 DIAGNOSIS — I10 Essential (primary) hypertension: Secondary | ICD-10-CM | POA: Diagnosis not present

## 2023-08-21 DIAGNOSIS — I2510 Atherosclerotic heart disease of native coronary artery without angina pectoris: Secondary | ICD-10-CM | POA: Diagnosis not present

## 2023-08-21 DIAGNOSIS — I5033 Acute on chronic diastolic (congestive) heart failure: Secondary | ICD-10-CM | POA: Diagnosis not present

## 2023-08-21 DIAGNOSIS — J189 Pneumonia, unspecified organism: Secondary | ICD-10-CM | POA: Diagnosis not present

## 2023-08-21 LAB — BASIC METABOLIC PANEL WITH GFR
Anion gap: 11 (ref 5–15)
BUN: 31 mg/dL — ABNORMAL HIGH (ref 8–23)
CO2: 21 mmol/L — ABNORMAL LOW (ref 22–32)
Calcium: 9.1 mg/dL (ref 8.9–10.3)
Chloride: 103 mmol/L (ref 98–111)
Creatinine, Ser: 1.42 mg/dL — ABNORMAL HIGH (ref 0.61–1.24)
GFR, Estimated: 46 mL/min — ABNORMAL LOW (ref >60)
Glucose, Bld: 112 mg/dL — ABNORMAL HIGH (ref 70–99)
Potassium: 3.7 mmol/L (ref 3.5–5.1)
Sodium: 135 mmol/L (ref 135–145)

## 2023-08-21 MED ORDER — ROSUVASTATIN CALCIUM 5 MG PO TABS: 5.0000 mg | ORAL_TABLET | Freq: Every day | ORAL | Status: DC

## 2023-08-21 MED ORDER — HYDROXYZINE HCL 10 MG/5ML PO SYRP
10.0000 mg | ORAL_SOLUTION | Freq: Three times a day (TID) | ORAL | 0 refills | Status: DC | PRN
  Filled 2023-08-21: qty 30, 2d supply, fill #0

## 2023-08-21 MED ORDER — MELATONIN 3 MG PO TABS
3.0000 mg | ORAL_TABLET | Freq: Every evening | ORAL | 0 refills | Status: DC | PRN
  Filled 2023-08-21: qty 30, 30d supply, fill #0

## 2023-08-21 MED ORDER — FOLIC ACID 1 MG PO TABS
1.0000 mg | ORAL_TABLET | Freq: Every day | ORAL | 0 refills | Status: DC
  Filled 2023-08-21: qty 30, 30d supply, fill #0

## 2023-08-21 MED ORDER — ENSURE ENLIVE PO LIQD: 237.0000 mL | Freq: Two times a day (BID) | ORAL | Status: DC

## 2023-08-21 NOTE — Progress Notes (Signed)
 Discharge instructions reviewed with pt's family.  Copy of instructions given to pt/family. Pt's family informed scripts sent to pt's pharmacy [facility], and one script sent to pt's mail pharmacy.) Unit NT assisting with pt getting dressed.   Pt will be d/c'd via wheelchair with belongings and will be escorted by staff.   Jaryah Aracena,RN SWOT

## 2023-08-21 NOTE — Discharge Summary (Addendum)
 Physician Discharge Summary   Patient: Evan [name] MRN: [id number] DOB: [birth year]  Admit date:     08/13/2023  Discharge date: 08/21/23  Discharge Physician: Evan [name] Evan [name]   PCP: Evan Lefort, MD   Recommendations at discharge:    Resume furosemide  40 mg po daily to keep negative fluid balance.  Limited guideline medical therapy due to acutely reduced GFR.  Follow up renal function and electrolytes in 7 days as outpatient Follow up with Dr Evan [name] in 7 to 10 days Follow up with Cardiology as scheduled.   I spoke with patient's family at the bedside, we talked in detail about patient's condition, plan of care and prognosis and all questions were addressed.   Discharge Diagnoses: Principal Problem:   Acute on chronic diastolic CHF (congestive heart failure) (HCC) Active Problems:   Bilateral pneumonia   CAD (coronary artery disease)   Essential hypertension   CKD stage 3b, GFR 30-44 ml/min (HCC) - baseline SCr 1.2-1.5   Hemiparesis due to old stroke (HCC)   Anemia   Depression   Protein-calorie malnutrition, severe   Chronic indwelling Foley catheter   COPD (chronic obstructive pulmonary disease) (HCC)   History of pulmonary embolism  Resolved Problems:   * No resolved hospital problems. [facility] Course: Evan [name] was admitted to the hospital with the working diagnosis of acute decompensation of heart failure in the setting of aspiration pneumonia/ metapneumovirus infection.   89/M hypertension, CKD 3B, CVA, CAD status post CABG and stent, GERD, anemia, chronic diastolic CHF, anxiety, depression, dementia, lung cancer, BPH, COPD presenting with worsening dyspnea.  Reported one week of cough, body aches, tachycardia. 3 days prior to admission   he was diagnosed with pneumonia. He was placed on Augmentin  and azithromycin . Unfortunately at home he had recurrence of fevers with increased work of breathing. On his initial physical examination his blood pressure  was 123/76, HR 90, RR 18 and 02 saturation 100%  Deconditioned and ill looking appearing, lungs with bilateral rales with no wheezing, heart with S1 and S2 present and tachycardic, abdomen with no distention and no lower extremity edema.   Na 134, K 3,7 Cl 103 bicarbonate 19 glucose 108 bun 29 cr 1,35  BNP 587  High sensitive troponin 21  Wbc 6,4 hgb 9,9 plt 87   Respiratory panel positive for metapneumovirus  Sars covid 19 negative Influenza negative RSV negative   Chest radiograph with hypoinflation, cardiomegaly, bilateral hilar vascular congestion with interstitial infiltrates at lower zones with basilar atelectasis bilaterally, sternotomy wires in place.   EKG 109 bpm, left axis deviation, left anterior fascicular block, qtc 483, junctional rhythm with no ST segment changes, negative T wace lead I, aVL, V1 -V3  Admitted, started on antibiotics and diuretics, SLP eval Diuretics.   05/16 clinically improving. Very weak and deconditioned.  05/17 pending renal function recovery, plan for discharge home on Monday  05/19 continue to improve respiratory status, plan to discharge home and continue home health services.   Assessment and Plan: * Acute on chronic diastolic CHF (congestive heart failure) (HCC) Echocardiogram with preserved LV systolic function with EF 60 to 65%, RV systolic function preserved, RVSP 54,8 mmHg, LA with severe dilatation, RA with moderate dilatation, mild MR, moderate TR,   Acute on chronic core pulmonale Pulmonary hypertension  Patient was placed on furosemide  for diuresis, negative fluid balance was achieved, -10,673 ml, with significant improvement in his symptoms.   Plan to continue with metoprolol  and will resume furosemide .  No SGLT 2 inh due to chronic foley catheter and risk of urinary tract infections.  Limited pharmacologic therapy due to acutely reduced GFR.    Bilateral pneumonia Metapneumovirus  Completed antibiotic therapy with  Augmentin  Continue aspiration precautions and thick liquids. Continue home health services with pt and ot.   CAD (coronary artery disease) No chest pain, continue blood pressure control.   Essential hypertension Continue blood pressure control with metoprolol    CKD stage 3b, GFR 30-44 ml/min (HCC) - baseline SCr 1.2-1.5 Hypokalemia and hypomagnesemia. Hyponatremia.  His volume status has improved, renal function stable with serum cr at 1,42 with K at 3,7 and serum bicarbonate at 21  Na 135 Mg 2.0   Continue furosemide  40 mg po daily and follow up renal function as outpatient.   Hemiparesis due to old stroke [facility]) Limited mobility.  Continue statin and blood pressure control.   Anemia Cell count has been stabe  Protein-calorie malnutrition, severe Continue nutritional supplements.,   Depression Continue fluoxetine    Chronic indwelling Foley catheter Foley care per protocol.  Continue tamsulosin  for BPH.   COPD (chronic obstructive pulmonary disease) (HCC) No signs of exacerbation  Continue bronchodilator therapy   History of pulmonary embolism Continue anticoagulation with apixaban .          Consultants: none  Procedures performed: none   Disposition: Home Diet recommendation:  Cardiac diet DISCHARGE MEDICATION: Allergies as of 08/21/2023       Reactions   Feldene [piroxicam] Rash   Blistering rash   Neomycin -polymyxin-hc Itching   Statins Other (See Comments)   Myalgias   Latex Rash   Band Aid   Tequin [gatifloxacin] Swelling, Anxiety   Valium [diazepam] Other (See Comments)   Dizziness   Vibramycin  [doxycycline ] Itching, Nausea Only        Medication List     STOP taking these medications    amoxicillin -clavulanate 875-125 MG tablet Commonly known as: AUGMENTIN    azithromycin  250 MG tablet Commonly known as: ZITHROMAX    erythromycin  ophthalmic ointment   finasteride  5 MG tablet Commonly known as: PROSCAR        TAKE these  medications    acetaminophen  500 MG tablet Commonly known as: TYLENOL  Take 500 mg by mouth 2 (two) times daily as needed for mild pain, moderate pain, fever or headache.   apixaban  2.5 MG Tabs tablet Commonly known as: Eliquis  Take 1 tablet (2.5 mg total) by mouth 2 (two) times daily.   ciprofloxacin  0.3 % ophthalmic solution Commonly known as: CILOXAN  Place 2 drops into both eyes every 4 (four) hours while awake. Administer 1 drop, every 2 hours, while awake, for 2 days. Then 1 drop, every 4 hours, while awake, for the next 5 days. What changed: additional instructions   FeroSul 325 (65 Fe) MG tablet Generic drug: ferrous sulfate  TAKE 1 TABLET EVERY DAY What changed: additional instructions   FLUoxetine  20 MG capsule Commonly known as: PROZAC  TAKE 1 CAPSULE EVERY DAY   folic acid  1 MG tablet Commonly known as: FOLVITE  Take 1 tablet (1 mg total) by mouth daily.   furosemide  40 MG tablet Commonly known as: LASIX  TAKE 1 TABLET EVERY DAY   hydrOXYzine  10 MG/5ML syrup Commonly known as: ATARAX  Take 5 mLs (10 mg total) by mouth every 8 (eight) hours as needed for itching.   melatonin 3 MG Tabs tablet Take 1 tablet (3 mg total) by mouth at bedtime as needed (insomnia).   Mens 50+ Multivitamin Tabs Take 1 tablet by mouth daily.  metoprolol  succinate 25 MG 24 hr tablet Commonly known as: TOPROL -XL Take 12.5 mg by mouth daily.   mupirocin  ointment 2 % Commonly known as: BACTROBAN  Apply 1 Application topically 2 (two) times daily.   nitroGLYCERIN  0.4 MG SL tablet Commonly known as: NITROSTAT  Place 1 tablet (0.4 mg total) under the tongue every 5 (five) minutes as needed for chest pain.   nystatin  powder Commonly known as: MYCOSTATIN /NYSTOP  Apply a thin layer topically to the affected area 2-3 (two to three) times daily.   pantoprazole  40 MG tablet Commonly known as: PROTONIX  TAKE 1 TABLET EVERY MORNING   rosuvastatin  5 MG tablet Commonly known as: CRESTOR  Take 1  tablet (5 mg total) by mouth at bedtime.   senna-docusate 8.6-50 MG tablet Commonly known as: Stimulant Laxative Take 2 tablets by mouth 2 (two) times daily as needed for constipation   Symbicort  160-4.5 MCG/ACT inhaler Generic drug: budesonide -formoterol  Inhale 2 puffs into the lungs in the morning and at bedtime.   tamsulosin  0.4 MG Caps capsule Commonly known as: FLOMAX  Take 1 capsule (0.4 mg total) by mouth daily with supper. What changed: when to take this   Zinc  Oxide 12.8 % ointment Commonly known as: TRIPLE PASTE Apply topically 4 (four) times daily as needed (skin barrier). What changed:  how much to take when to take this        Discharge Exam: Filed Weights   08/19/23 0415 08/20/23 0445 08/21/23 0540  Weight: 58.6 kg 58.4 kg 58.9 kg   BP 103/69   Pulse 81   Temp 98.4 F (36.9 C) (Oral)   Resp 17   Ht 5\' 2"  (1.575 m)   Wt 58.9 kg   SpO2 95%   BMI 23.75 kg/m   Patient with no chest pain or dyspnea, no lower extremity edema, PND or orthopnea   Neurology awake and alert ENT with mild pallor Cardiovascular with S1 and S2 present and regular with no gallops or rubs, systolic murmur at the right lower sternal border Respiratory with no wheezing or rales, no rhonchi, prolonged expiratory phase. Abdomen with no distention  No lower extremity edema Chronic foley in place   Condition at discharge: stable  The results of significant diagnostics from this hospitalization (including imaging, microbiology, ancillary and laboratory) are listed below for reference.   Imaging Studies: DG Swallowing Func-Speech Pathology Result Date: 08/14/2023 Table formatting from the original result was not included. Modified Barium Swallow Study Patient Details Name: DARICK FETTERS MRN: [id number] Date of Birth: 1927-07-14 Today's Date: 08/14/2023 HPI/PMH: HPI: COSTANTINO KOHLBECK is a 89 y.o. male who presented with shortness of breath and recent pneumonia diagnosis. CXR 5/11: "Patchy  bibasilar reticulonodular opacities, favored slightly  increased since June 2024. Findings likely reflect a combination of  aspiration, atelectasis or infection superimposed on a background of  chronic scarring."  Pt known to this service from prior admission.  MBSS in Dec largely WFL with pna at that time, but had sick contacts.   MBSS in 2023 and 2020 as well all with recs to cont regular/thin, but with some precuations/compensatory strategies. Pt with medical history significant of hypertension, CKD 3B, CVA, CAD status post CABG and stent, GERD, anemia, chronic diastolic CHF, anxiety, depression, dementia, lung cancer, BPH, COPD. Clinical Impression: Clinical Impression: Patient presents with a mild oropharyngeal dysphagia characterized by midlly prolonged oral transit of bolus with regular solids however remains functional. Largest deficit is a delay in swallow initiation to the pyriform sinuses which result in deep penetration  and aspiration of thin liquids which is not consistently sensed. Trials of nectar thick liquid significantly increased airway protection wtih only subtle penetration noted with large bolus via straw. Laryngeal/pharyngeal strength intact. Appearance of a shadow, question calcification along posterior pharyngeal wall, above UES, moves partially with movement of other swallowing structures and does not appear to be impacting swallowing function overall, defer further w/u to MD. At this time, recommend regular solids with nectar thick liquids. SLP will f/u. Factors that may increase risk of adverse event in presence of aspiration Roderick Civatte & Jessy Morocco 2021): Factors that may increase risk of adverse event in presence of aspiration Roderick Civatte & Jessy Morocco 2021): Respiratory or GI disease Recommendations/Plan: Swallowing Evaluation Recommendations Swallowing Evaluation Recommendations Recommendations: PO diet PO Diet Recommendation: Regular; Mildly thick liquids (Level 2, nectar thick) Liquid  Administration via: Cup; No straw Medication Administration: Whole meds with puree Supervision: Patient able to self-feed; Full supervision/cueing for swallowing strategies Swallowing strategies  : Slow rate; Small bites/sips Postural changes: Position pt fully upright for meals Oral care recommendations: Oral care BID (2x/day) Treatment Plan Treatment Plan Treatment recommendations: Therapy as outlined in treatment plan below Functional status assessment: Patient has had a recent decline in their functional status and demonstrates the ability to make significant improvements in function in a reasonable and predictable amount of time. Treatment frequency: Min 2x/week Treatment duration: 2 weeks Interventions: Aspiration precaution training; Compensatory techniques; Patient/family education; Trials of upgraded texture/liquids; Diet toleration management by SLP Recommendations Recommendations for follow up therapy are one component of a multi-disciplinary discharge planning process, led by the attending physician.  Recommendations may be updated based on patient status, additional functional criteria and insurance authorization. Assessment: Orofacial Exam: Orofacial Exam Oral Cavity: Oral Hygiene: Dried secretions Oral Cavity - Dentition: Adequate natural dentition Orofacial Anatomy: WFL Oral Motor/Sensory Function: -- (some left sided assymetry of the face, otherwise WFL) Anatomy: Anatomy: Other (Comment) (appearance of a shadow, question calcification along posterior pharyngeal wall, above UES, moves partially with movement of other swallowing structures and does not appear to be impacting swallowing function overall, defer further w/u to MBS) Boluses Administered: Boluses Administered Boluses Administered: Thin liquids (Level 0); Mildly thick liquids (Level 2, nectar thick); Puree; Solid  Oral Impairment Domain: Oral Impairment Domain Lip Closure: Escape beyond mid-chin Tongue control during bolus hold: Cohesive  bolus between tongue to palatal seal Bolus preparation/mastication: Slow prolonged chewing/mashing with complete recollection Bolus transport/lingual motion: Delayed initiation of tongue motion (oral holding) Oral residue: Trace residue lining oral structures Location of oral residue : Tongue Initiation of pharyngeal swallow : Pyriform sinuses  Pharyngeal Impairment Domain: Pharyngeal Impairment Domain Soft palate elevation: No bolus between soft palate (SP)/pharyngeal wall (PW) Laryngeal elevation: Complete superior movement of thyroid  cartilage with complete approximation of arytenoids to epiglottic petiole Anterior hyoid excursion: Complete anterior movement Epiglottic movement: Complete inversion Laryngeal vestibule closure: Incomplete, narrow column air/contrast in laryngeal vestibule Pharyngeal stripping wave : -- (difficult to view with calcification) Pharyngeal contraction (A/P view only): N/A Pharyngoesophageal segment opening: Complete distension and complete duration, no obstruction of flow Tongue base retraction: No contrast between tongue base and posterior pharyngeal wall (PPW) Pharyngeal residue: Complete pharyngeal clearance  Esophageal Impairment Domain: Esophageal Impairment Domain Esophageal clearance upright position: Complete clearance, esophageal coating Pill: Pill Consistency administered: Puree Puree: WFL Penetration/Aspiration Scale Score: Penetration/Aspiration Scale Score 1.  Material does not enter airway: Puree; Pill; Solid 3.  Material enters airway, remains ABOVE vocal cords and not ejected out: Mildly thick liquids (Level 2, nectar thick)  5.  Material enters airway, CONTACTS cords and not ejected out: Thin liquids (Level 0) 7.  Material enters airway, passes BELOW cords and not ejected out despite cough attempt by patient: Thin liquids (Level 0) 8.  Material enters airway, passes BELOW cords without attempt by patient to eject out (silent aspiration) : Thin liquids (Level 0)  Compensatory Strategies: Compensatory Strategies Compensatory strategies: Yes Chin tuck: Ineffective Ineffective Chin Tuck: Thin liquid (Level 0)   General Information: Caregiver present: No  Diet Prior to this Study: Regular; Thin liquids (Level 0)   Temperature : Normal   Respiratory Status: WFL   Supplemental O2: Nasal cannula   History of Recent Intubation: No  Behavior/Cognition: Alert; Cooperative; Pleasant mood; Requires cueing Self-Feeding Abilities: Able to self-feed No data recorded Volitional Cough: Able to elicit Volitional Swallow: Able to elicit No data recorded Goal Planning: Prognosis for improved oropharyngeal function: Fair Barriers to Reach Goals: Other (Comment) (baseline dysphagia and advanced age) No data recorded Patient/Family Stated Goal: MBS Consulted and agree with results and recommendations: Patient (family not in room after study) Pain: Pain Assessment Pain Assessment: No/denies pain Breathing: 0 Negative Vocalization: 0 Facial Expression: 0 Body Language: 0 Consolability: 0 PAINAD Score: 0 End of Session: Start Time:SLP Start Time (ACUTE ONLY): 1315 Stop Time: SLP Stop Time (ACUTE ONLY): 1329 Time Calculation:SLP Time Calculation (min) (ACUTE ONLY): 14 min Charges: SLP Evaluations $ SLP Speech Visit: 1 Visit SLP Evaluations $BSS Swallow: 1 Procedure $MBS Swallow: 1 Procedure SLP visit diagnosis: SLP Visit Diagnosis: Dysphagia, oropharyngeal phase (R13.12) Past Medical History: Past Medical History: Diagnosis Date  Acute blood loss anemia   Acute bronchitis 05/15/2017  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 07/14/2018  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  Altered mental status 08/21/2018  Anginal chest pain at rest [facility])   Chronic non, controlled on Lorazepam   Anxiety   Arthritis   "all over"   BPH (benign prostatic hyperplasia)   CAD (coronary artery disease)   Cellulitis of arm, right 06/01/2018  Cellulitis of  left axilla   Chronic lower back pain   Complication of anesthesia   "problems making water afterwards"  Depression   DJD (degenerative joint disease)   Esophageal ulcer without bleeding   bid ppi indefinitely  Fall 07/16/2018  Family history of adverse reaction to anesthesia   "children w/PONV"  GERD (gastroesophageal reflux disease)   Haemophilus influenzae septicemia (HCC) 03/12/2023  Headache   "couple/week maybe" (09/04/2015)  Hemochromatosis   Possible, elevated iron  stores, hook-like osteophytes on hand films, normal LFTs  Hepatitis   "yellow jaundice as a baby"  History of ASCVD   MULTIVESSEL  Hyperlipidemia   Hypertension   Hypotension   Mild aortic stenosis 06/23/2021  Osteoarthritis   Peripheral vascular disease (HCC)   Pneumonia   "several times; got a little now" (09/04/2015)  Pulmonary embolism (HCC) 02/02/2018  Rash and nonspecific skin eruption 06/01/2018  Rheumatoid arthritis (HCC)   RSV (respiratory syncytial virus pneumonia) 05/15/2022  Subdural hematoma (HCC)   small after fall 09/2016-plavix  held, neurosurgery consulted.  Asymptomatic.    Thromboembolism (HCC) 02/2018  on Lovenox  lifelong since he failed PO Eliquis  Past Surgical History: Past Surgical History: Procedure Laterality Date  ARM SKIN LESION BIOPSY / EXCISION Left 09/02/2015  CATARACT EXTRACTION W/ INTRAOCULAR LENS  IMPLANT, BILATERAL Bilateral   CHOLECYSTECTOMY OPEN    CORNEAL TRANSPLANT Bilateral   "one at [facility]; one at [facility]"  CORONARY ANGIOPLASTY WITH STENT PLACEMENT  CORONARY ARTERY BYPASS GRAFT  1999  DESCENDING AORTIC ANEURYSM REPAIR W/ STENT    DE stent ostium into the right radial free graft at OM-, 02-2007  ESOPHAGOGASTRODUODENOSCOPY N/A 11/09/2014  Procedure: ESOPHAGOGASTRODUODENOSCOPY (EGD);  Surgeon: Delilah Fend, MD;  Location: [facility] ENDOSCOPY;  Service: Endoscopy;  Laterality: N/A;  INGUINAL HERNIA REPAIR    JOINT REPLACEMENT    LEFT HEART CATH AND CORS/GRAFTS ANGIOGRAPHY N/A 06/27/2017  Procedure: LEFT HEART CATH AND CORS/GRAFTS  ANGIOGRAPHY;  Surgeon: Millicent Ally, MD;  Location: [facility] INVASIVE CV LAB;  Service: Cardiovascular;  Laterality: N/A;  NASAL SINUS SURGERY    SHOULDER OPEN ROTATOR CUFF REPAIR Right   TOTAL KNEE ARTHROPLASTY Bilateral  Delsa Fife MA, CCC-SLP McCoy Leah Meryl 08/14/2023, 1:52 PM  DG Chest Port 1 View Result Date: 08/13/2023 CLINICAL DATA:  fever cough EXAM: PORTABLE CHEST 1 VIEW COMPARISON:  March 11, 2023, September 27, 2022 FINDINGS: The cardiomediastinal silhouette is unchanged in contour.Status post median sternotomy. Small bilateral pleural effusions. No pneumothorax. There are patchy bibasilar reticulonodular opacities, many of which are chronic in appearance. Gaseous distension of bowel in the upper abdomen. IMPRESSION: 1. Small bilateral pleural effusions. 2. Patchy bibasilar reticulonodular opacities, favored slightly increased since June 2024. Findings likely reflect a combination of aspiration, atelectasis or infection superimposed on a background of chronic scarring. 3. Gaseous distension of bowel. Electronically Signed   By: Clancy Crimes M.D.   On: 08/13/2023 11:05    Microbiology: Results for orders placed or performed during the hospital encounter of 08/13/23  Resp panel by RT-PCR (RSV, Flu A&B, Covid) Anterior Nasal Swab     Status: None   Collection Time: 08/13/23  9:58 AM   Specimen: Anterior Nasal Swab  Result Value Ref Range Status   SARS Coronavirus 2 by RT PCR NEGATIVE NEGATIVE Final   Influenza A by PCR NEGATIVE NEGATIVE Final   Influenza B by PCR NEGATIVE NEGATIVE Final    Comment: (NOTE) The Xpert Xpress SARS-CoV-2/FLU/RSV plus assay is intended as an aid in the diagnosis of influenza from Nasopharyngeal swab specimens and should not be used as a sole basis for treatment. Nasal washings and aspirates are unacceptable for Xpert Xpress SARS-CoV-2/FLU/RSV testing.  Fact Sheet for Patients: BloggerCourse.com  Fact Sheet for Healthcare  Providers: SeriousBroker.it  This test is not yet approved or cleared by the [place]  FDA and has been authorized for detection and/or diagnosis of SARS-CoV-2 by FDA under an Emergency Use Authorization (EUA). This EUA will remain in effect (meaning this test can be used) for the duration of the COVID-19 declaration under Section 564(b)(1) of the Act, 21 U.S.C. section 360bbb-3(b)(1), unless the authorization is terminated or revoked.     Resp Syncytial Virus by PCR NEGATIVE NEGATIVE Final    Comment: (NOTE) Fact Sheet for Patients: BloggerCourse.com  Fact Sheet for Healthcare Providers: SeriousBroker.it  This test is not yet approved or cleared by the [place]  FDA and has been authorized for detection and/or diagnosis of SARS-CoV-2 by FDA under an Emergency Use Authorization (EUA). This EUA will remain in effect (meaning this test can be used) for the duration of the COVID-19 declaration under Section 564(b)(1) of the Act, 21 U.S.C. section 360bbb-3(b)(1), unless the authorization is terminated or revoked.  Performed at [facility], [address], [place], [place] [address]   Respiratory (~20 pathogens) panel by PCR     Status: Abnormal   Collection Time: 08/13/23  5:26 PM   Specimen: Nasopharyngeal Swab; Respiratory  Result Value  Ref Range Status   Adenovirus NOT DETECTED NOT DETECTED Final   Coronavirus 229E NOT DETECTED NOT DETECTED Final    Comment: (NOTE) The Coronavirus on the Respiratory Panel, DOES NOT test for the novel  Coronavirus (2019 nCoV)    Coronavirus HKU1 NOT DETECTED NOT DETECTED Final   Coronavirus NL63 NOT DETECTED NOT DETECTED Final   Coronavirus OC43 NOT DETECTED NOT DETECTED Final   Metapneumovirus DETECTED (A) NOT DETECTED Final   Rhinovirus / Enterovirus NOT DETECTED NOT DETECTED Final   Influenza A NOT DETECTED NOT DETECTED Final   Influenza B NOT  DETECTED NOT DETECTED Final   Parainfluenza Virus 1 NOT DETECTED NOT DETECTED Final   Parainfluenza Virus 2 NOT DETECTED NOT DETECTED Final   Parainfluenza Virus 3 NOT DETECTED NOT DETECTED Final   Parainfluenza Virus 4 NOT DETECTED NOT DETECTED Final   Respiratory Syncytial Virus NOT DETECTED NOT DETECTED Final   Bordetella pertussis NOT DETECTED NOT DETECTED Final   Bordetella Parapertussis NOT DETECTED NOT DETECTED Final   Chlamydophila pneumoniae NOT DETECTED NOT DETECTED Final   Mycoplasma pneumoniae NOT DETECTED NOT DETECTED Final    Comment: Performed at [facility], [address] [place], [place] [address]   *Note: Due to a large number of results and/or encounters for the requested time period, some results have not been displayed. A complete set of results can be found in Results Review.    Labs: CBC: Recent Labs  Lab 08/15/23 0317 08/16/23 0252 08/18/23 1036  WBC 4.9 5.3 6.0  HGB 9.3* 8.8* 10.3*  HCT 27.6* 25.9* 30.5*  MCV 101.5* 100.0 100.7*  PLT 74* 72* 111*   Basic Metabolic Panel: Recent Labs  Lab 08/15/23 0317 08/16/23 0252 08/18/23 1036 08/19/23 0231 08/21/23 1000  NA 136 135 136 134* 135  K 3.6 3.3* 4.5 4.2 3.7  CL 105 105 103 100 103  CO2 20* 22 22 22  21*  GLUCOSE 105* 101* 113* 101* 112*  BUN 25* 23 30* 35* 31*  CREATININE 1.32* 1.19 1.39* 1.42* 1.42*  CALCIUM  8.4* 8.3* 9.3 9.3 9.1  MG  --   --  2.0  --   --    Liver Function Tests: No results for input(s): "AST", "ALT", "ALKPHOS", "BILITOT", "PROT", "ALBUMIN" in the last 168 hours. CBG: No results for input(s): "GLUCAP" in the last 168 hours.  Discharge time spent: greater than 30 minutes.  Signed: Albertus Alt, MD [facility] 08/21/2023

## 2023-08-21 NOTE — Plan of Care (Signed)
  Problem: Education: Goal: Knowledge of General Education information will improve Description: Including pain rating scale, medication(s)/side effects and non-pharmacologic comfort measures Outcome: Adequate for Discharge   Problem: Health Behavior/Discharge Planning: Goal: Ability to manage health-related needs will improve Outcome: Adequate for Discharge   Problem: Clinical Measurements: Goal: Ability to maintain clinical measurements within normal limits will improve Outcome: Adequate for Discharge Goal: Will remain free from infection Outcome: Adequate for Discharge Goal: Diagnostic test results will improve Outcome: Adequate for Discharge Goal: Respiratory complications will improve Outcome: Adequate for Discharge Goal: Cardiovascular complication will be avoided Outcome: Adequate for Discharge   Problem: Activity: Goal: Risk for activity intolerance will decrease Outcome: Adequate for Discharge   Problem: Nutrition: Goal: Adequate nutrition will be maintained Outcome: Adequate for Discharge   Problem: Coping: Goal: Level of anxiety will decrease Outcome: Adequate for Discharge   Problem: Elimination: Goal: Will not experience complications related to bowel motility Outcome: Adequate for Discharge Goal: Will not experience complications related to urinary retention Outcome: Adequate for Discharge   Problem: Pain Managment: Goal: General experience of comfort will improve and/or be controlled Outcome: Adequate for Discharge   Problem: Safety: Goal: Ability to remain free from injury will improve Outcome: Adequate for Discharge   Problem: Skin Integrity: Goal: Risk for impaired skin integrity will decrease Outcome: Adequate for Discharge   Problem: Activity: Goal: Ability to tolerate increased activity will improve Outcome: Adequate for Discharge   Problem: Clinical Measurements: Goal: Ability to maintain a body temperature in the normal range will  improve Outcome: Adequate for Discharge   Problem: Respiratory: Goal: Ability to maintain adequate ventilation will improve Outcome: Adequate for Discharge Goal: Ability to maintain a clear airway will improve Outcome: Adequate for Discharge

## 2023-08-21 NOTE — TOC Transition Note (Signed)
 Transition of Care [facility]) - Discharge Note   Patient Details  Name: Evan [name] MRN: [id number] Date of Birth: 1927-07-19  Transition of Care [facility]) CM/SW Contact:  Jennett Model, RN Phone Number: 08/21/2023, 12:56 PM   Clinical Narrative:    For dc today, daughter in room at bedside, NCM offered choice,daughter stating they are [facility].  NCM confirmed with [facility] .  Soc will begin 24 to 48 hrs post dc.  Daughter will transport him home today.         Patient Goals and CMS Choice            Discharge Placement                       Discharge Plan and Services Additional resources added to the After Visit Summary for                                       Social Drivers of Health (SDOH) Interventions SDOH Screenings   Food Insecurity: No Food Insecurity (08/13/2023)  Housing: Low Risk  (08/13/2023)  Transportation Needs: No Transportation Needs (08/13/2023)  Utilities: Not At Risk (08/13/2023)  Alcohol  Screen: Low Risk  (04/20/2023)  Depression (PHQ2-9): High Risk (07/10/2023)  Financial Resource Strain: Low Risk  (04/20/2023)  Physical Activity: Insufficiently Active (04/20/2023)  Social Connections: Socially Isolated (08/13/2023)  Stress: No Stress Concern Present (04/20/2023)  Tobacco Use: Medium Risk (08/10/2023)  Health Literacy: Inadequate Health Literacy (04/20/2023)     Readmission Risk Interventions    08/15/2023    4:46 PM 03/10/2023    2:24 PM 05/17/2022    1:25 PM  Readmission Risk Prevention Plan  Transportation Screening Complete Complete Complete  PCP or Specialist Appt within 3-5 Days Complete    HRI or Home Care Consult Complete    Palliative Care Screening Not Applicable    Medication Review (RN Care Manager) Complete Complete Complete  PCP or Specialist appointment within 3-5 days of discharge  Complete Complete  HRI or Home Care Consult  Complete Complete  SW Recovery Care/Counseling  Consult  Complete   Palliative Care Screening  Not Applicable   Skilled Nursing Facility  Not Applicable

## 2023-08-21 NOTE — Plan of Care (Signed)
?  Problem: Clinical Measurements: ?Goal: Diagnostic test results will improve ?Outcome: Progressing ?Goal: Respiratory complications will improve ?Outcome: Progressing ?  ?Problem: Activity: ?Goal: Risk for activity intolerance will decrease ?Outcome: Progressing ?  ?Problem: Coping: ?Goal: Level of anxiety will decrease ?Outcome: Progressing ?  ?

## 2023-08-22 ENCOUNTER — Telehealth: Payer: Self-pay

## 2023-08-22 ENCOUNTER — Telehealth: Payer: Self-pay | Admitting: *Deleted

## 2023-08-22 DIAGNOSIS — J440 Chronic obstructive pulmonary disease with acute lower respiratory infection: Secondary | ICD-10-CM | POA: Diagnosis not present

## 2023-08-22 DIAGNOSIS — N1832 Chronic kidney disease, stage 3b: Secondary | ICD-10-CM | POA: Diagnosis not present

## 2023-08-22 DIAGNOSIS — D631 Anemia in chronic kidney disease: Secondary | ICD-10-CM | POA: Diagnosis not present

## 2023-08-22 DIAGNOSIS — J690 Pneumonitis due to inhalation of food and vomit: Secondary | ICD-10-CM | POA: Diagnosis not present

## 2023-08-22 DIAGNOSIS — J123 Human metapneumovirus pneumonia: Secondary | ICD-10-CM | POA: Diagnosis not present

## 2023-08-22 DIAGNOSIS — I13 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 failure and stage 1 through stage 4 chronic kidney disease, or unspecified chronic kidney disease: Secondary | ICD-10-CM | POA: Diagnosis not present

## 2023-08-22 DIAGNOSIS — I5033 Acute on chronic diastolic (congestive) heart failure: Secondary | ICD-10-CM | POA: Diagnosis not present

## 2023-08-22 DIAGNOSIS — D630 Anemia in neoplastic disease: Secondary | ICD-10-CM | POA: Diagnosis not present

## 2023-08-22 DIAGNOSIS — C3490 Malignant neoplasm of unspecified part of unspecified bronchus or lung: Secondary | ICD-10-CM | POA: Diagnosis not present

## 2023-08-22 NOTE — Transitions of Care (Post Inpatient/ED Visit) (Signed)
 08/22/2023  Name: Evan [name] MRN: [id number] DOB: 07-31-1927  Today's TOC FU Call Status: Today's TOC FU Call Status:: Successful TOC FU Call Completed TOC FU Call Complete Date: 08/22/23 Patient's Name and Date of Birth confirmed.  Transition Care Management Follow-up Telephone Call Date of Discharge: 08/21/23 Discharge Facility: [facility]) Type of Discharge: Inpatient Admission Primary Inpatient Discharge Diagnosis:: Acute on chronic diastolic CHF (congestive heart failure) (HCC) How have you been since you were released from the hospital?: Better (per daughter Evan [name], "feels much better", on thick it for liquids, foley catheter intact, draining well, "some diarrhea but getting better") Any questions or concerns?: No  Items Reviewed: Did you receive and understand the discharge instructions provided?: Yes Medications obtained,verified, and reconciled?: Yes (Medications Reviewed) Any new allergies since your discharge?: No Dietary orders reviewed?: Yes Type of Diet Ordered:: low sodium,  heart healthy Do you have support at home?: Yes People in Home [RPT]: alone Name of Support/Comfort Primary Source: daughter Evan [name] visits pt daily and provides oversight for all care, patient's 2 sons stay at night Update sent to [facility] Little RN CM that pt is enrolled in TOC 30 day program   Goals Addressed             This Visit's Progress    [facility] Transitions of Care (TOC) Care Plan       Problems:  Recent Hospitalization for treatment of CHF Home Health services barrier: [facility] has not contacted patient and No Specialist appointment daughter states she is going to talk with primary care provider about choosing a cardiologist Per daughter, pt is not able to weigh, too unsteady, uses walker, lives alone, daughter checks in and sees pt daily, 2 sons spend the night, adult children provide all oversight for care Patient has primary care provider appointment 6/6, there  is earlier appointment per collaboration with care guide for 5/27 with NP and daughter does not want to see another provider and declined. Daughter reports no issues with foley catheter, pt uses thick it for liquids and has been doing this for quite some time, pt was on hospice services and discharged recently per daughter (after 6 months), pt has slight diarrhea per daughter and states " but looks like it's getting better, will take something if anything gets worse"  Goal:  Over the next 30 days, the patient will not experience hospital readmission  Interventions:   Heart Failure Interventions: Basic overview and discussion of pathophysiology of Heart Failure reviewed Provided education on low sodium diet Assessed need for readable accurate scales in home Discussed importance of daily weight and advised patient to weigh and record daily Discussed the importance of keeping all appointments with provider Screening for signs and symptoms of depression related to chronic disease state  Assessed social determinant of health barriers  Reviewed signs /symptoms of infection, pneumonia Instructed daughter to let primary care provider know if diarrhea does not resolve or gets worse Spoke with Jenna at [facility], they have orders and Evan [name] is getting all the documents together, they will call pt/ daughter and schedule a date and time for first visit  Patient Self Care Activities:  Follow Heart Failure action plan, call your doctor early on for change in health status / symptoms (weight gain, swelling in feet, increased shortness of breath, overall not feeling well) Call 911 for unrelieved shortness of breath, chest pain, fainting Be mindful of sodium intake, avoid fast food, salty snacks Take antibiotic as prescribed If  diarrhea worsens or does not resolve, please let your doctor know [facility] will contact you to set up a day and time for first visit,  [phone]   Plan:  Telephone follow  up appointment with care management team member scheduled for:  08/31/23 @ 1015 am The patient has been provided with contact information for the care management team and has been advised to call with any health related questions or concerns.          Medications Reviewed Today: Medications Reviewed Today     Reviewed by Daralyn Earl, RN (Registered Nurse) on 08/22/23 at 1017  Med List Status: <None>   Medication Order Taking? Sig Documenting Provider Last Dose Status Informant  acetaminophen  (TYLENOL ) 500 MG tablet [id number] Yes Take 500 mg by mouth 2 (two) times daily as needed for mild pain, moderate pain, fever or headache. [provider] Taking Active Child, Pharmacy Records  apixaban  (ELIQUIS ) 2.5 MG TABS tablet [id number] Yes Take 1 tablet (2.5 mg total) by mouth 2 (two) times daily. Gerald Kitty., NP Taking Active Child, Pharmacy Records  budesonide -formoterol  (SYMBICORT ) 160-4.5 MCG/ACT inhaler [id number] Yes Inhale 2 puffs into the lungs in the morning and at bedtime. Austine Lefort, MD Taking Active Child, Pharmacy Records  ciprofloxacin  (CILOXAN ) 0.3 % ophthalmic solution [id number] Yes Place 2 drops into both eyes every 4 (four) hours while awake. Administer 1 drop, every 2 hours, while awake, for 2 days. Then 1 drop, every 4 hours, while awake, for the next 5 days.  Patient taking differently: Place 2 drops into both eyes every 4 (four) hours while awake. Patient is taking as needed.   Unk Garb, DO Taking Active Child, Pharmacy Records  ferrous sulfate  [facility]) 325 (65 FE) MG tablet [id number] Yes TAKE 1 TABLET EVERY DAY Austine Lefort, MD Taking Active   FLUoxetine  (PROZAC ) 20 MG capsule [id number] Yes TAKE 1 CAPSULE EVERY DAY Austine Lefort, MD Taking Active Child, Pharmacy Records  folic acid  (FOLVITE ) 1 MG tablet [id number] Yes Take 1 tablet (1 mg total) by mouth daily. [name], Curlee Doss, MD Taking Active   furosemide  (LASIX ) 40 MG tablet [id number]  Yes TAKE 1 TABLET EVERY DAY Austine Lefort, MD Taking Active Child, Pharmacy Records  hydrOXYzine  (ATARAX ) 10 MG/5ML syrup [id number] Yes Take 5 mLs (10 mg total) by mouth every 8 (eight) hours as needed for itching. [name], Curlee Doss, MD Taking Active   melatonin 3 MG TABS tablet [id number] Yes Take 1 tablet (3 mg total) by mouth at bedtime as needed (insomnia). [name], Curlee Doss, MD Taking Active   metoprolol  succinate (TOPROL -XL) 25 MG 24 hr tablet [id number] Yes Take 12.5 mg by mouth daily. [provider] Taking Active Child, Pharmacy Records  Multiple Vitamins-Minerals (MENS 50+ MULTIVITAMIN) TABS [id number] Yes Take 1 tablet by mouth daily. [provider] Taking Active Child, Pharmacy Records  mupirocin  ointment (BACTROBAN ) 2 % [id number] Yes Apply 1 Application topically 2 (two) times daily. Hyatt, [name], DPM Taking Active Child, Pharmacy Records  nitroGLYCERIN  (NITROSTAT ) 0.4 MG SL tablet [id number] Yes Place 1 tablet (0.4 mg total) under the tongue every 5 (five) minutes as needed for chest pain. Austine Lefort, MD Taking Active Child, Pharmacy Records  nystatin  (MYCOSTATIN /NYSTOP ) powder [id number] Yes Apply a thin layer topically to the affected area 2-3 (two to three) times daily.  Taking Active Child, Pharmacy Records  pantoprazole  (PROTONIX ) 40 MG tablet [id number] Yes TAKE 1 TABLET EVERY MORNING Pickard, Cisco Crest, MD  Taking Active Child, Pharmacy Records  rosuvastatin  (CRESTOR ) 5 MG tablet [id number] Yes Take 1 tablet (5 mg total) by mouth at bedtime. [name], Curlee Doss, MD Taking Active   senna-docusate (STIMULANT LAXATIVE) 8.6-50 MG tablet [id number] No Take 2 tablets by mouth 2 (two) times daily as needed for constipation  Patient not taking: Reported on 08/22/2023    Not Taking Active Child, Pharmacy Records  tamsulosin  (FLOMAX ) 0.4 MG CAPS capsule [id number] Yes Take 1 capsule (0.4 mg total) by mouth daily with supper.  Patient taking differently: Take  0.4 mg by mouth at bedtime.   Austine Lefort, MD Taking Active Child, Pharmacy Records  Zinc  Oxide (TRIPLE PASTE) 12.8 % ointment [id number] Yes Apply topically 4 (four) times daily as needed (skin barrier).  Patient taking differently: Apply 1 Application topically 2 (two) times daily as needed (skin barrier).   Etter Hermann., MD Taking Active Child, Pharmacy Records            Home Care and Equipment/Supplies: Were Home Health Services Ordered?: Yes Name of Home Health Agency:: [facility] Has Agency set up a time to come to your home?:  (spoke with Jenna at [facility], confirmed they do have orders and Evan [name] is getting all the documents together and they will call pt / daughter and schedule a date and time) EMR reviewed for Home Health Orders: Orders present/patient has not received call (refer to CM for follow-up) Any new equipment or medical supplies ordered?: No  Functional Questionnaire: Do you need assistance with bathing/showering or dressing?: Yes (shower seat) Do you need assistance with meal preparation?: Yes (daughter assists) Do you need assistance with eating?: No Do you have difficulty maintaining continence: Yes (foley catheter) Do you need assistance with getting out of bed/getting out of a chair/moving?: Yes (walker) Do you have difficulty managing or taking your medications?: Yes  Follow up appointments reviewed: PCP Follow-up appointment confirmed?: Yes Date of PCP follow-up appointment?: 09/08/23 Follow-up Provider: [facility] Vall[facility] Follow-up appointment confirmed?: No Reason Specialist Follow-Up Not Confirmed: Patient has Specialist Provider Number and will Call for Appointment Do you need transportation to your follow-up appointment?: No Do you understand care options if your condition(s) worsen?: Yes-patient verbalized understanding  SDOH Interventions Today    Flowsheet Row Most Recent Value  SDOH Interventions   Food Insecurity  Interventions Intervention Not Indicated  Housing Interventions Intervention Not Indicated  Transportation Interventions Intervention Not Indicated  Utilities Interventions Intervention Not Indicated       Cecilie Coffee [facility], BSN RN Care Manager/ Transition of Care [place]/ [facility] Population Health [phone]

## 2023-08-22 NOTE — Telephone Encounter (Signed)
 Verbal orders given. Mjp,lpn  Copied from CRM [id number]. Topic: Clinical - Home Health Verbal Orders >> Aug 22, 2023  4:18 PM Fonda T wrote: Caller/Agency: Ninette Basque with [facility] Callback Number: [phone] Service Requested: Skilled Nursing, Occupational and Physical therapy Frequency: once a week for 2 additional weeks Any new concerns about the patient? No

## 2023-08-23 ENCOUNTER — Other Ambulatory Visit: Payer: Self-pay

## 2023-08-23 ENCOUNTER — Emergency Department (HOSPITAL_COMMUNITY)

## 2023-08-23 ENCOUNTER — Encounter (HOSPITAL_COMMUNITY): Payer: Self-pay | Admitting: Emergency Medicine

## 2023-08-23 ENCOUNTER — Observation Stay (HOSPITAL_COMMUNITY)
Admission: EM | Admit: 2023-08-23 | Discharge: 2023-08-24 | Disposition: A | Discharge status: Home / Self Care | Attending: Family Medicine | Admitting: Family Medicine

## 2023-08-23 ENCOUNTER — Ambulatory Visit: Admitting: Podiatry

## 2023-08-23 DIAGNOSIS — F0390 Unspecified dementia without behavioral disturbance: Secondary | ICD-10-CM | POA: Insufficient documentation

## 2023-08-23 DIAGNOSIS — F39 Unspecified mood [affective] disorder: Secondary | ICD-10-CM | POA: Diagnosis not present

## 2023-08-23 DIAGNOSIS — I13 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 failure and stage 1 through stage 4 chronic kidney disease, or unspecified chronic kidney disease: Secondary | ICD-10-CM | POA: Insufficient documentation

## 2023-08-23 DIAGNOSIS — R6510 Systemic inflammatory response syndrome (SIRS) of non-infectious origin without acute organ dysfunction: Secondary | ICD-10-CM | POA: Diagnosis not present

## 2023-08-23 DIAGNOSIS — I5033 Acute on chronic diastolic (congestive) heart failure: Secondary | ICD-10-CM | POA: Diagnosis not present

## 2023-08-23 DIAGNOSIS — Z86718 Personal history of other venous thrombosis and embolism: Secondary | ICD-10-CM | POA: Insufficient documentation

## 2023-08-23 DIAGNOSIS — J690 Pneumonitis due to inhalation of food and vomit: Secondary | ICD-10-CM | POA: Diagnosis not present

## 2023-08-23 DIAGNOSIS — N1832 Chronic kidney disease, stage 3b: Secondary | ICD-10-CM | POA: Insufficient documentation

## 2023-08-23 DIAGNOSIS — L03119 Cellulitis of unspecified part of limb: Secondary | ICD-10-CM | POA: Diagnosis present

## 2023-08-23 DIAGNOSIS — I959 Hypotension, unspecified: Secondary | ICD-10-CM | POA: Insufficient documentation

## 2023-08-23 DIAGNOSIS — N400 Enlarged prostate without lower urinary tract symptoms: Secondary | ICD-10-CM | POA: Diagnosis not present

## 2023-08-23 DIAGNOSIS — R2231 Localized swelling, mass and lump, right upper limb: Secondary | ICD-10-CM | POA: Diagnosis present

## 2023-08-23 DIAGNOSIS — C3490 Malignant neoplasm of unspecified part of unspecified bronchus or lung: Secondary | ICD-10-CM | POA: Diagnosis not present

## 2023-08-23 DIAGNOSIS — Z87891 Personal history of nicotine dependence: Secondary | ICD-10-CM | POA: Diagnosis not present

## 2023-08-23 DIAGNOSIS — M1811 Unilateral primary osteoarthritis of first carpometacarpal joint, right hand: Secondary | ICD-10-CM | POA: Diagnosis not present

## 2023-08-23 DIAGNOSIS — Z85118 Personal history of other malignant neoplasm of bronchus and lung: Secondary | ICD-10-CM | POA: Insufficient documentation

## 2023-08-23 DIAGNOSIS — A419 Sepsis, unspecified organism: Secondary | ICD-10-CM | POA: Diagnosis not present

## 2023-08-23 DIAGNOSIS — R4182 Altered mental status, unspecified: Secondary | ICD-10-CM | POA: Diagnosis not present

## 2023-08-23 DIAGNOSIS — J440 Chronic obstructive pulmonary disease with acute lower respiratory infection: Secondary | ICD-10-CM | POA: Diagnosis not present

## 2023-08-23 DIAGNOSIS — N401 Enlarged prostate with lower urinary tract symptoms: Secondary | ICD-10-CM | POA: Insufficient documentation

## 2023-08-23 DIAGNOSIS — L03113 Cellulitis of right upper limb: Principal | ICD-10-CM | POA: Insufficient documentation

## 2023-08-23 DIAGNOSIS — M25531 Pain in right wrist: Secondary | ICD-10-CM | POA: Diagnosis not present

## 2023-08-23 DIAGNOSIS — N390 Urinary tract infection, site not specified: Secondary | ICD-10-CM | POA: Insufficient documentation

## 2023-08-23 DIAGNOSIS — I517 Cardiomegaly: Secondary | ICD-10-CM | POA: Diagnosis not present

## 2023-08-23 DIAGNOSIS — I2510 Atherosclerotic heart disease of native coronary artery without angina pectoris: Secondary | ICD-10-CM | POA: Diagnosis not present

## 2023-08-23 DIAGNOSIS — Z7901 Long term (current) use of anticoagulants: Secondary | ICD-10-CM | POA: Diagnosis not present

## 2023-08-23 DIAGNOSIS — I700 Atherosclerosis of aorta: Secondary | ICD-10-CM | POA: Diagnosis not present

## 2023-08-23 DIAGNOSIS — Z96653 Presence of artificial knee joint, bilateral: Secondary | ICD-10-CM | POA: Insufficient documentation

## 2023-08-23 DIAGNOSIS — Z951 Presence of aortocoronary bypass graft: Secondary | ICD-10-CM | POA: Diagnosis not present

## 2023-08-23 DIAGNOSIS — Z8673 Personal history of transient ischemic attack (TIA), and cerebral infarction without residual deficits: Secondary | ICD-10-CM | POA: Insufficient documentation

## 2023-08-23 DIAGNOSIS — D631 Anemia in chronic kidney disease: Secondary | ICD-10-CM | POA: Diagnosis not present

## 2023-08-23 DIAGNOSIS — J449 Chronic obstructive pulmonary disease, unspecified: Secondary | ICD-10-CM | POA: Insufficient documentation

## 2023-08-23 DIAGNOSIS — Z91040 Latex allergy status: Secondary | ICD-10-CM | POA: Diagnosis not present

## 2023-08-23 DIAGNOSIS — Z8679 Personal history of other diseases of the circulatory system: Secondary | ICD-10-CM | POA: Insufficient documentation

## 2023-08-23 DIAGNOSIS — D630 Anemia in neoplastic disease: Secondary | ICD-10-CM | POA: Diagnosis not present

## 2023-08-23 DIAGNOSIS — L0390 Cellulitis, unspecified: Secondary | ICD-10-CM | POA: Diagnosis not present

## 2023-08-23 DIAGNOSIS — I5030 Unspecified diastolic (congestive) heart failure: Secondary | ICD-10-CM | POA: Diagnosis not present

## 2023-08-23 DIAGNOSIS — E861 Hypovolemia: Secondary | ICD-10-CM | POA: Diagnosis not present

## 2023-08-23 DIAGNOSIS — J123 Human metapneumovirus pneumonia: Secondary | ICD-10-CM | POA: Diagnosis not present

## 2023-08-23 LAB — URINALYSIS, ROUTINE W REFLEX MICROSCOPIC
Bilirubin Urine: NEGATIVE
Bilirubin Urine: NEGATIVE
Glucose, UA: NEGATIVE mg/dL
Glucose, UA: NEGATIVE mg/dL
Hgb urine dipstick: NEGATIVE
Hgb urine dipstick: NEGATIVE
Ketones, ur: NEGATIVE mg/dL
Ketones, ur: NEGATIVE mg/dL
Nitrite: NEGATIVE
Nitrite: NEGATIVE
Protein, ur: NEGATIVE mg/dL
Protein, ur: NEGATIVE mg/dL
Specific Gravity, Urine: 1.014 (ref 1.005–1.030)
Specific Gravity, Urine: 1.015 (ref 1.005–1.030)
pH: 6 (ref 5.0–8.0)
pH: 6 (ref 5.0–8.0)

## 2023-08-23 LAB — COMPREHENSIVE METABOLIC PANEL WITH GFR
ALT: 12 U/L (ref 0–44)
AST: 24 U/L (ref 15–41)
Albumin: 3.3 g/dL — ABNORMAL LOW (ref 3.5–5.0)
Alkaline Phosphatase: 85 U/L (ref 38–126)
Anion gap: 13 (ref 5–15)
BUN: 32 mg/dL — ABNORMAL HIGH (ref 8–23)
CO2: 19 mmol/L — ABNORMAL LOW (ref 22–32)
Calcium: 9 mg/dL (ref 8.9–10.3)
Chloride: 102 mmol/L (ref 98–111)
Creatinine, Ser: 1.51 mg/dL — ABNORMAL HIGH (ref 0.61–1.24)
GFR, Estimated: 43 mL/min — ABNORMAL LOW (ref >60)
Glucose, Bld: 135 mg/dL — ABNORMAL HIGH (ref 70–99)
Potassium: 4.5 mmol/L (ref 3.5–5.1)
Sodium: 134 mmol/L — ABNORMAL LOW (ref 135–145)
Total Bilirubin: 0.9 mg/dL (ref 0.0–1.2)
Total Protein: 7.4 g/dL (ref 6.5–8.1)

## 2023-08-23 LAB — CBC
HCT: 26.9 % — ABNORMAL LOW (ref 39.0–52.0)
Hemoglobin: 9.1 g/dL — ABNORMAL LOW (ref 13.0–17.0)
MCH: 34.6 pg — ABNORMAL HIGH (ref 26.0–34.0)
MCHC: 33.8 g/dL (ref 30.0–36.0)
MCV: 102.3 fL — ABNORMAL HIGH (ref 80.0–100.0)
Platelets: 179 10*3/uL (ref 150–400)
RBC: 2.63 MIL/uL — ABNORMAL LOW (ref 4.22–5.81)
RDW: 13.9 % (ref 11.5–15.5)
WBC: 16.1 10*3/uL — ABNORMAL HIGH (ref 4.0–10.5)
nRBC: 0 % (ref 0.0–0.2)

## 2023-08-23 LAB — I-STAT CG4 LACTIC ACID, ED
Lactic Acid, Venous: 2.1 mmol/L (ref 0.5–1.9)
Lactic Acid, Venous: 0.9 mmol/L (ref 0.5–1.9)

## 2023-08-23 MED ORDER — CEFAZOLIN SODIUM-DEXTROSE 1-4 GM/50ML-% IV SOLN: 1.0000 g | Freq: Two times a day (BID) | INTRAVENOUS | Status: DC

## 2023-08-23 MED ORDER — FLUOXETINE HCL 20 MG PO CAPS
20.0000 mg | ORAL_CAPSULE | Freq: Every day | ORAL | Status: DC
  Administered 2023-08-24: 20 mg via ORAL
  Filled 2023-08-23: qty 1

## 2023-08-23 MED ORDER — FLUTICASONE FUROATE-VILANTEROL 200-25 MCG/ACT IN AEPB
1.0000 | INHALATION_SPRAY | Freq: Every day | RESPIRATORY_TRACT | Status: DC
  Administered 2023-08-24: 1 via RESPIRATORY_TRACT
  Filled 2023-08-23: qty 28

## 2023-08-23 MED ORDER — TAMSULOSIN HCL 0.4 MG PO CAPS
0.4000 mg | ORAL_CAPSULE | Freq: Every day | ORAL | Status: DC
  Administered 2023-08-24: 0.4 mg via ORAL
  Filled 2023-08-23: qty 1

## 2023-08-23 MED ORDER — MELATONIN 3 MG PO TABS: 6.0000 mg | ORAL_TABLET | Freq: Every evening | ORAL | Status: DC | PRN

## 2023-08-23 MED ORDER — ROSUVASTATIN CALCIUM 5 MG PO TABS
5.0000 mg | ORAL_TABLET | Freq: Every day | ORAL | Status: DC
  Administered 2023-08-24: 5 mg via ORAL
  Filled 2023-08-23: qty 1

## 2023-08-23 MED ORDER — CEFAZOLIN SODIUM-DEXTROSE 1-4 GM/50ML-% IV SOLN: 1.0000 g | Freq: Three times a day (TID) | INTRAVENOUS | Status: DC

## 2023-08-23 MED ORDER — FERROUS SULFATE 325 (65 FE) MG PO TABS
325.0000 mg | ORAL_TABLET | Freq: Every day | ORAL | Status: DC
  Administered 2023-08-24: 325 mg via ORAL
  Filled 2023-08-23: qty 1

## 2023-08-23 MED ORDER — POLYETHYLENE GLYCOL 3350 17 G PO PACK: 17.0000 g | PACK | Freq: Every day | ORAL | Status: DC | PRN

## 2023-08-23 MED ORDER — SODIUM CHLORIDE 0.9% FLUSH
3.0000 mL | Freq: Two times a day (BID) | INTRAVENOUS | Status: DC
  Administered 2023-08-24: 3 mL via INTRAVENOUS

## 2023-08-23 MED ORDER — SODIUM CHLORIDE 0.9 % IV BOLUS
1000.0000 mL | Freq: Once | INTRAVENOUS | Status: AC
Start: 2023-08-23 — End: 2023-08-23
  Administered 2023-08-23: 1000 mL via INTRAVENOUS

## 2023-08-23 MED ORDER — SODIUM CHLORIDE 0.9 % IV SOLN
2.0000 g | Freq: Once | INTRAVENOUS | Status: AC
  Administered 2023-08-23: 2 g via INTRAVENOUS
  Filled 2023-08-23: qty 20

## 2023-08-23 MED ORDER — APIXABAN 2.5 MG PO TABS
2.5000 mg | ORAL_TABLET | Freq: Two times a day (BID) | ORAL | Status: DC
  Administered 2023-08-24 (×2): 2.5 mg via ORAL
  Filled 2023-08-23 (×2): qty 1

## 2023-08-23 MED ORDER — PANTOPRAZOLE SODIUM 40 MG PO TBEC
40.0000 mg | DELAYED_RELEASE_TABLET | Freq: Every morning | ORAL | Status: DC
  Administered 2023-08-24: 40 mg via ORAL
  Filled 2023-08-23: qty 1

## 2023-08-23 MED ORDER — SODIUM CHLORIDE 0.9 % IV BOLUS
500.0000 mL | Freq: Once | INTRAVENOUS | Status: AC
  Administered 2023-08-24: 500 mL via INTRAVENOUS

## 2023-08-23 MED ORDER — ACETAMINOPHEN 500 MG PO TABS: 1000.0000 mg | ORAL_TABLET | Freq: Four times a day (QID) | ORAL | Status: DC | PRN

## 2023-08-23 NOTE — ED Provider Triage Note (Signed)
 Emergency Medicine Provider Triage Evaluation Note  Evan [name] , a 89 y.o. male  was evaluated in triage.  Pt complains of right arm redness which has been ongoing since Monday, after he had a PICC line removed of the right.  Orting to wife he seems altered, try to have breakfast today when noted severe pain to the right arm.  Recent discharge from the hospital as well.  Foley in place.  Anticoagulated on Eliquis  due to A-fib.  Review of Systems  Positive: Ams, right arm redness Negative:   Physical Exam  BP (!) 85/61 (BP Location: Left Arm)   Pulse (!) 113   Temp 97.7 F (36.5 C)   Resp 16   SpO2 98%  Gen:   Awake, no distress   Resp:  Normal effort  MSK:   Moves extremities without difficulty  Other:  Erythema extending to the upper right arm  Medical Decision Making  Medically screening exam initiated at 5:38 PM.  Appropriate orders placed.  [name] was informed that the remainder of the evaluation will be completed by another provider, this initial triage assessment does not replace that evaluation, and the importance of remaining in the ED until their evaluation is complete.  Hypotensive in triage, needs room ASAP Also feels warm, will need rectal temp   Fina Heizer, PA-C 08/23/23 1743

## 2023-08-23 NOTE — ED Provider Notes (Signed)
 [place] [facility] Provider Note   CSN: [id number] Arrival date & time: 08/23/23  1725     History  Chief Complaint  Patient presents with   Arm Swelling   Altered Mental Status    Evan [name] is a 89 y.o. 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  Reports that he had a PICC line for antibiotics from recent admission for pneumonia.  Denies recent falls or trauma to RUE, nor fevers.  Family also reports some concern regarding mild disorientation, decreased appetite that started yesterday.  He normally feeds himself and has not been wanting to eat.  Denies vomiting, diarrhea, abdominal pain   Altered Mental Status Associated symptoms: no abdominal pain, no fever, no headaches, no light-headedness, no nausea, no palpitations, no seizures, no vomiting and no weakness        Home Medications Prior to Admission medications   Medication Sig Start Date End Date Taking? Authorizing Provider  acetaminophen  (TYLENOL ) 500 MG tablet Take 500 mg by mouth 2 (two) times daily as needed for mild pain, moderate pain, fever or headache.    [provider]  apixaban  (ELIQUIS ) 2.5 MG TABS tablet Take 1 tablet (2.5 mg total) by mouth 2 (two) times daily. 10/14/22   Gerald Kitty., NP  budesonide -formoterol  (SYMBICORT ) 160-4.5 MCG/ACT inhaler Inhale 2 puffs into the lungs in the morning and at bedtime. 06/08/23   Austine Lefort, MD  ciprofloxacin  (CILOXAN ) 0.3 % ophthalmic solution Place 2 drops into both eyes every 4 (four) hours while awake. Administer 1 drop, every 2 hours, while awake, for 2 days. Then 1 drop, every 4 hours, while awake, for the next 5 days. Patient taking differently: Place 2 drops into both eyes every 4 (four) hours while awake. Patient is taking as needed. 03/13/23   Unk Garb, DO  ferrous sulfate   (FEROSUL) 325 (65 FE) MG tablet TAKE 1 TABLET EVERY DAY 08/15/23   Austine Lefort, MD  FLUoxetine  (PROZAC ) 20 MG capsule TAKE 1 CAPSULE EVERY DAY 03/20/23   Austine Lefort, MD  folic acid  (FOLVITE ) 1 MG tablet Take 1 tablet (1 mg total) by mouth daily. 08/21/23   [name], Mauricio Daniel, MD  furosemide  (LASIX ) 40 MG tablet TAKE 1 TABLET EVERY DAY 07/10/23   Austine Lefort, MD  hydrOXYzine  (ATARAX ) 10 MG/5ML syrup Take 5 mLs (10 mg total) by mouth every 8 (eight) hours as needed for itching. 08/21/23   [name], Mauricio Daniel, MD  melatonin 3 MG TABS tablet Take 1 tablet (3 mg total) by mouth at bedtime as needed (insomnia). 08/21/23   [name], Mauricio Daniel, MD  metoprolol  succinate (TOPROL -XL) 25 MG 24 hr tablet Take 12.5 mg by mouth daily.    [provider]  Multiple Vitamins-Minerals (MENS 50+ MULTIVITAMIN) TABS Take 1 tablet by mouth daily.    [provider]  mupirocin  ointment (BACTROBAN ) 2 % Apply 1 Application topically 2 (two) times daily. 07/24/23   Hyatt, [name], DPM  nitroGLYCERIN  (NITROSTAT ) 0.4 MG SL tablet Place 1 tablet (0.4 mg total) under the tongue every 5 (five) minutes as needed for chest pain. 05/16/23   Austine Lefort, MD  nystatin  (MYCOSTATIN /NYSTOP ) powder Apply a thin layer topically to the affected area 2-3 (two to three) times daily. 08/03/23     pantoprazole  (PROTONIX ) 40 MG tablet TAKE 1  TABLET EVERY MORNING 07/04/23   Austine Lefort, MD  rosuvastatin  (CRESTOR ) 5 MG tablet Take 1 tablet (5 mg total) by mouth at bedtime. 08/21/23   [name], Curlee Doss, MD  senna-docusate (STIMULANT LAXATIVE) 8.6-50 MG tablet Take 2 tablets by mouth 2 (two) times daily as needed for constipation Patient not taking: Reported on 08/22/2023 07/21/22     tamsulosin  (FLOMAX ) 0.4 MG CAPS capsule Take 1 capsule (0.4 mg total) by mouth daily with supper. Patient taking differently: Take 0.4 mg by mouth at bedtime. 01/26/23   Austine Lefort, MD  Zinc  Oxide (TRIPLE PASTE)  12.8 % ointment Apply topically 4 (four) times daily as needed (skin barrier). Patient taking differently: Apply 1 Application topically 2 (two) times daily as needed (skin barrier). 05/03/22   Etter Hermann., MD      Allergies    Feldene [piroxicam], Neomycin -polymyxin-hc, Statins, Latex, Tequin [gatifloxacin], Valium [diazepam], and Vibramycin  [doxycycline ]    Review of Systems   Review of Systems  Constitutional:  Negative for chills, fatigue and fever.  Respiratory:  Negative for cough, chest tightness, shortness of breath and wheezing.   Cardiovascular:  Negative for chest pain and palpitations.  Gastrointestinal:  Negative for abdominal pain, constipation, diarrhea, nausea and vomiting.  Neurological:  Negative for dizziness, seizures, weakness, light-headedness, numbness and headaches.    Physical Exam Updated Vital Signs BP (!) 102/57   Pulse 81   Temp 97.6 F (36.4 C) (Oral)   Resp (!) 22   SpO2 97%  Physical Exam Vitals and nursing note reviewed.  Constitutional:      General: He is not in acute distress.    Appearance: Normal appearance.  HENT:     Head: Normocephalic and atraumatic.  Eyes:     General: No visual field deficit.    Conjunctiva/sclera: Conjunctivae normal.  Cardiovascular:     Rate and Rhythm: Normal rate.  Pulmonary:     Effort: Pulmonary effort is normal. No respiratory distress.     Breath sounds: Normal breath sounds.  Chest:     Chest wall: No tenderness.  Abdominal:     General: Bowel sounds are normal. There is no distension.     Palpations: Abdomen is soft.     Tenderness: There is no abdominal tenderness. There is no guarding.  Musculoskeletal:     Cervical back: Normal range of motion and neck supple. No rigidity.     Right lower leg: No edema.     Left lower leg: No edema.     Comments: Mild swelling, erythema, pain around right wrist. Held in flexed position. Pain with extension, Radial 2+. Sensation 2/2. Motor 5/5   Skin:    General: Skin is warm.     Coloration: Skin is not jaundiced or pale.  Neurological:     Mental Status: He is alert and oriented to person, place, and time. Mental status is at baseline.     GCS: GCS eye subscore is 4. GCS verbal subscore is 5. GCS motor subscore is 6.     Cranial Nerves: No facial asymmetry.     Motor: No tremor, abnormal muscle tone, seizure activity or pronator drift.     ED Results / Procedures / Treatments   Labs (all labs ordered are listed, but only abnormal results are displayed) Labs Reviewed  COMPREHENSIVE METABOLIC PANEL WITH GFR - Abnormal; Notable for the following components:      Result Value   Sodium 134 (*)    CO2 19 (*)  Glucose, Bld 135 (*)    BUN 32 (*)    Creatinine, Ser 1.51 (*)    Albumin 3.3 (*)    GFR, Estimated 43 (*)    All other components within normal limits  CBC - Abnormal; Notable for the following components:   WBC 16.1 (*)    RBC 2.63 (*)    Hemoglobin 9.1 (*)    HCT 26.9 (*)    MCV 102.3 (*)    MCH 34.6 (*)    All other components within normal limits  URINALYSIS, ROUTINE W REFLEX MICROSCOPIC - Abnormal; Notable for the following components:   APPearance HAZY (*)    Leukocytes,Ua SMALL (*)    Bacteria, UA RARE (*)    All other components within normal limits  I-STAT CG4 LACTIC ACID, ED - Abnormal; Notable for the following components:   Lactic Acid, Venous 2.1 (*)    All other components within normal limits  CULTURE, BLOOD (ROUTINE X 2)  CULTURE, BLOOD (ROUTINE X 2)  BASIC METABOLIC PANEL WITH GFR  CBC  MAGNESIUM   PHOSPHORUS  CBG MONITORING, ED  I-STAT CG4 LACTIC ACID, ED    EKG EKG Interpretation Date/Time:  Wednesday Aug 23 2023 18:09:48 EDT Ventricular Rate:  98 PR Interval:  115 QRS Duration:  114 QT Interval:  438 QTC Calculation: 560 R Axis:   -10  Text Interpretation: Sinus rhythm Borderline short PR interval Left ventricular hypertrophy Prolonged QT interval Artifact in lead(s) I III  aVR aVL aVF V2 No significant change since last tracing Confirmed by Albertus Hughs [phone]) on 08/23/2023 6:14:04 PM  Radiology DG Wrist Complete Right Result Date: 08/23/2023 CLINICAL DATA:  Pain and stiffness, initial encounter EXAM: RIGHT WRIST - COMPLETE 3+ VIEW COMPARISON:  None Available. FINDINGS: Significant degenerative changes at the first [facility] joint are noted. No acute fracture or dislocation is noted. Degenerative changes in the radiocarpal joint are seen as well. No soft tissue abnormality is seen. IMPRESSION: Degenerative change without acute abnormality. Electronically Signed   By: Violeta Grey M.D.   On: 08/23/2023 19:14   DG Chest Portable 1 View Result Date: 08/23/2023 CLINICAL DATA:  Altered mental status EXAM: PORTABLE CHEST 1 VIEW COMPARISON:  08/13/2023 FINDINGS: Cardiac shadow is enlarged but stable. Postsurgical changes are seen. Aortic calcifications are noted. Chronic fibrotic changes are noted right greater than left stable from the prior exam. No acute infiltrate or effusion is noted. No bony abnormality is seen. IMPRESSION: Chronic changes without acute abnormality. Electronically Signed   By: Violeta Grey M.D.   On: 08/23/2023 19:13    Procedures Procedures    Medications Ordered in ED Medications  sodium chloride  flush (NS) 0.9 % injection 3 mL (3 mLs Intravenous Not Given 08/23/23 2134)  acetaminophen  (TYLENOL ) tablet 1,000 mg (has no administration in time range)  melatonin tablet 6 mg (has no administration in time range)  polyethylene glycol (MIRALAX  / GLYCOLAX ) packet 17 g (has no administration in time range)  ceFAZolin  (ANCEF ) IVPB 1 g/50 mL premix (has no administration in time range)  sodium chloride  0.9 % bolus 1,000 mL (1,000 mLs Intravenous New Bag/Given 08/23/23 2021)  cefTRIAXone  (ROCEPHIN ) 2 g in sodium chloride  0.9 % 100 mL IVPB (2 g Intravenous New Bag/Given 08/23/23 2058)    ED Course/ Medical Decision Making/ A&P                                  Medical  Decision Making Amount and/or Complexity of Data Reviewed Radiology: ordered.  Risk Decision regarding hospitalization.   Patient presents to the ED for concern of right wrist pain, erythema, this involves an extensive number of treatment options, and is a complaint that carries with it a high risk of complications and morbidity.  The differential diagnosis includes cellulitis, septic joint, traumatic injury, osteomyelitis   Co morbidities that complicate the patient evaluation  See HPI   Additional history obtained:  Additional history obtained from Family, Nursing, Outside Medical Records, and Past Admission   External records from outside source obtained and reviewed including RN note   Lab Tests:  I Ordered, and personally interpreted labs.  The pertinent results include:   CBG 135 BUN 32 Creatinine 1.51 WBC 16.1 Hemoglobin 9.1 UA positive for small leukocytes, 6-10 RBC and WBC, rare bacteria   Imaging Studies ordered:  I ordered imaging studies including CXR, right wrist x-ray I independently visualized and interpreted imaging which showed no acute intrathoracic abnormalities, no acute wrist abnormalities I agree with the radiologist interpretation   Cardiac Monitoring:  The patient was maintained on a cardiac monitor.  I personally viewed and interpreted the cardiac monitored which showed an underlying rhythm of: NSR   Medicines ordered and prescription drug management:  I ordered medication including rocephin   for cellulitis  Reevaluation of the patient after these medicines showed that the patient improved I have reviewed the patients home medicines and have made adjustments as needed     Consultations Obtained:  I requested consultation with the hospitalist Dr. Amy Kansky,  and discussed lab and imaging findings as well as pertinent plan - accepts patient for admission   Problem List / ED Course:  AMS A&Ox3 with EDP Family reported that  patient has been a little more disoriented recently to include confusion regarding where items are located and time Low suspicion for ICH.  No recent trauma.  Neurologic intact per his baseline. UA not grossly infected but does have small leukocytes, 6-10 RBC, 6-10 WBC, rare bacteria.  Will cover with Rocephin  as this is likely secondary to chronic catheter use  Hypotension Normally in 100s SBP per family Here in ED, patient's systolic 85.  Provided IVF increasing BP to 100 systolic  Cellulitis Patient is holding right wrist in flexed position with pain with movement.  Difficulty with full extension of right wrist. Not grossly swollen. Well perfused with radial 2+ intact. Sensation 2/2 of RUE Erythema and warmth localized to right wrist. No streaking up arm nor warmth, erythema to right elbow nor right shoulder.  Lactic acid initially mildly elevated 2.1 but downtrending to 0.9 following fluids WBC 16.  BC pending Will have patient admitted for cellulitis as he was mildly hypotensive at arrival and would likely benefit from IV abx Provided rocephin  for cellulitis to cover for mild UTI, cellulitis   Reevaluation:  After the interventions noted above, I reevaluated the patient and found that they have :stayed the same    Dispostion:  After consideration of the diagnostic results and the patients response to treatment, I feel that the patent would benefit from admission for IV antibiotics, ops.   Discussed ED workup, disposition, return to ED precautions with patient who expresses understanding agrees with plan.  All questions answered to their satisfaction.  They are agreeable to plan.  Discharge instructions provided on paperwork  Discussed patient with Dr. Inga Manges who individually assessed pt and agrees with plan Final Clinical Impression(s) / ED Diagnoses Final diagnoses:  Cellulitis,  unspecified cellulitis site  Hypotension, unspecified hypotension type    Rx / DC Orders ED  Discharge Orders     None         Royann Cords, PA 08/23/23 2229    Albertus Hughs, DO 08/23/23 2308

## 2023-08-23 NOTE — H&P (Signed)
 History and Physical    Evan [name] HYQ:[id number] DOB: 1928/02/21 DOA: 08/23/2023  PCP: Austine Lefort, MD   Patient coming from: Home   Chief Complaint:  Chief Complaint  Patient presents with   Arm Swelling   Altered Mental Status    HPI: History limited by patient's dementia, provided by son at the bedside Evan [name] is a 89 y.o. male with hx of dementia, CAD, CABG, HFpEF, CVA, PE on anticoagulation, lung cancer, hypertension, CKD 3B, COPD, BPH with chronic Foley, recent admission to [facility] from 5/11-19 for metapneumovirus pneumonia and heart failure exacerbation, who presents with worsening pain in his right hand.  Son reports over the past day he is began to complain about pain over the right hand and noted to rash forming on the dorsal aspect.  Also appearing to have pain higher up in the arm.  Otherwise denies any other recent changes, no fever, chills.  Cough has continued since his last hospitalization.  No other abdominal symptoms, dysuria, other rashes.    Review of Systems:  ROS complete and negative except as marked above   Allergies  Allergen Reactions   Feldene [Piroxicam] Rash    Blistering rash     Neomycin -Polymyxin-Hc Itching   Statins Other (See Comments)    Myalgias   Latex Rash    Band Aid   Tequin [Gatifloxacin] Swelling and Anxiety   Valium [Diazepam] Other (See Comments)    Dizziness    Vibramycin  [Doxycycline ] Itching and Nausea Only    Prior to Admission medications   Medication Sig Start Date End Date Taking? Authorizing Provider  acetaminophen  (TYLENOL ) 500 MG tablet Take 500 mg by mouth 2 (two) times daily as needed for mild pain, moderate pain, fever or headache.   Yes [provider]  apixaban  (ELIQUIS ) 2.5 MG TABS tablet Take 1 tablet (2.5 mg total) by mouth 2 (two) times daily. 10/14/22  Yes Gerald Kitty., NP  budesonide -formoterol  (SYMBICORT ) 160-4.5 MCG/ACT inhaler Inhale 2 puffs into the lungs in the morning and at  bedtime. 06/08/23  Yes Austine Lefort, MD  ciprofloxacin  (CILOXAN ) 0.3 % ophthalmic solution Place 2 drops into both eyes every 4 (four) hours while awake. Administer 1 drop, every 2 hours, while awake, for 2 days. Then 1 drop, every 4 hours, while awake, for the next 5 days. Patient taking differently: Place 2 drops into both eyes every 4 (four) hours while awake. Patient is taking as needed. 03/13/23  Yes Unk Garb, DO  ferrous sulfate  (FEROSUL) 325 (65 FE) MG tablet TAKE 1 TABLET EVERY DAY 08/15/23  Yes Austine Lefort, MD  FLUoxetine  (PROZAC ) 20 MG capsule TAKE 1 CAPSULE EVERY DAY 03/20/23  Yes Austine Lefort, MD  folic acid  (FOLVITE ) 1 MG tablet Take 1 tablet (1 mg total) by mouth daily. 08/21/23  Yes [name], Curlee Doss, MD  furosemide  (LASIX ) 40 MG tablet TAKE 1 TABLET EVERY DAY 07/10/23  Yes Austine Lefort, MD  hydrOXYzine  (ATARAX ) 10 MG/5ML syrup Take 5 mLs (10 mg total) by mouth every 8 (eight) hours as needed for itching. 08/21/23  Yes [name], Mauricio Daniel, MD  melatonin 3 MG TABS tablet Take 1 tablet (3 mg total) by mouth at bedtime as needed (insomnia). 08/21/23  Yes [name], Mauricio Daniel, MD  metoprolol  succinate (TOPROL -XL) 25 MG 24 hr tablet Take 12.5 mg by mouth daily.   Yes [provider]  Multiple Vitamins-Minerals (MENS 50+ MULTIVITAMIN) TABS Take 1 tablet by mouth daily.  Yes [provider]  nitroGLYCERIN  (NITROSTAT ) 0.4 MG SL tablet Place 1 tablet (0.4 mg total) under the tongue every 5 (five) minutes as needed for chest pain. 05/16/23  Yes Austine Lefort, MD  nystatin  (MYCOSTATIN /NYSTOP ) powder Apply a thin layer topically to the affected area 2-3 (two to three) times daily. 08/03/23  Yes   pantoprazole  (PROTONIX ) 40 MG tablet TAKE 1 TABLET EVERY MORNING 07/04/23  Yes Austine Lefort, MD  rosuvastatin  (CRESTOR ) 5 MG tablet Take 1 tablet (5 mg total) by mouth at bedtime. 08/21/23  Yes [name], Curlee Doss, MD  senna-docusate (STIMULANT LAXATIVE)  8.6-50 MG tablet Take 2 tablets by mouth 2 (two) times daily as needed for constipation 07/21/22  Yes   tamsulosin  (FLOMAX ) 0.4 MG CAPS capsule Take 1 capsule (0.4 mg total) by mouth daily with supper. Patient taking differently: Take 0.4 mg by mouth at bedtime. 01/26/23  Yes Austine Lefort, MD  Zinc  Oxide (TRIPLE PASTE) 12.8 % ointment Apply topically 4 (four) times daily as needed (skin barrier). Patient taking differently: Apply 1 Application topically 2 (two) times daily as needed (skin barrier). 05/03/22  Yes Etter Hermann., MD  mupirocin  ointment (BACTROBAN ) 2 % Apply 1 Application topically 2 (two) times daily. 07/24/23   Clemetine Cypher, DPM    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   Altered mental status 08/21/2018   Anginal chest pain at rest [facility])    Chronic non, controlled on Lorazepam    Anxiety    Arthritis    "all over"    BPH (benign prostatic hyperplasia)    CAD (coronary artery disease)    Cellulitis of arm, right 06/01/2018   Cellulitis of left axilla    Chronic lower back pain    Complication of anesthesia    "problems making water afterwards"   Depression    DJD (degenerative joint disease)    Esophageal ulcer without bleeding    bid ppi indefinitely   Fall 07/16/2018   Family history of adverse reaction to anesthesia    "children w/PONV"   GERD (gastroesophageal reflux disease)    Haemophilus influenzae septicemia (HCC) 03/12/2023   Headache    "couple/week maybe" (09/04/2015)   Hemochromatosis    Possible, elevated iron  stores, hook-like osteophytes on hand films, normal LFTs   Hepatitis    "yellow jaundice as a baby"   History of ASCVD    MULTIVESSEL   Hyperlipidemia    Hypertension    Hypotension    Mild aortic stenosis  06/23/2021   Osteoarthritis    Peripheral vascular disease (HCC)    Pneumonia    "several times; got a little now" (09/04/2015)   Pulmonary embolism (HCC) 02/02/2018   Rash and nonspecific skin eruption 06/01/2018   Rheumatoid arthritis (HCC)    RSV (respiratory syncytial virus pneumonia) 05/15/2022   Subdural hematoma (HCC)    small after fall 09/2016-plavix  held, neurosurgery consulted.  Asymptomatic.     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 PO Eliquis     Past Surgical History:  Procedure Laterality Date   ARM SKIN LESION BIOPSY / EXCISION Left 09/02/2015   CATARACT EXTRACTION W/ INTRAOCULAR LENS  IMPLANT, BILATERAL Bilateral    CHOLECYSTECTOMY OPEN     CORNEAL TRANSPLANT Bilateral    "one  at [facility]; one at [facility]"   CORONARY ANGIOPLASTY WITH STENT PLACEMENT     CORONARY ARTERY BYPASS GRAFT  1999   DESCENDING AORTIC ANEURYSM REPAIR W/ STENT     DE stent ostium into the right radial free graft at OM-, 02-2007   ESOPHAGOGASTRODUODENOSCOPY N/A 11/09/2014   Procedure: ESOPHAGOGASTRODUODENOSCOPY (EGD);  Surgeon: Delilah Fend, MD;  Location: [facility] ENDOSCOPY;  Service: Endoscopy;  Laterality: N/A;   INGUINAL HERNIA REPAIR     JOINT REPLACEMENT     LEFT HEART CATH AND CORS/GRAFTS ANGIOGRAPHY N/A 06/27/2017   Procedure: LEFT HEART CATH AND CORS/GRAFTS ANGIOGRAPHY;  Surgeon: Millicent Ally, MD;  Location: [facility] INVASIVE CV LAB;  Service: Cardiovascular;  Laterality: N/A;   NASAL SINUS SURGERY     SHOULDER OPEN ROTATOR CUFF REPAIR Right    TOTAL KNEE ARTHROPLASTY Bilateral      reports that he has quit smoking. His smoking use included cigarettes. He has been exposed to tobacco smoke. He has quit using smokeless tobacco.  His smokeless tobacco use included chew. He reports that he does not drink alcohol  and does not use drugs.  Family History  Problem Relation Age of Onset   Arthritis-Osteo Sister    Heart attack Brother    Heart attack Other    Cancer Neg Hx       Physical Exam: Vitals:   08/23/23 2134 08/23/23 2145 08/23/23 2200 08/23/23 2230  BP:  109/60 (!) 115/101 (!) 108/51  Pulse:  85 90 80  Resp:  (!) 24 (!) 28 (!) 22  Temp: 97.6 F (36.4 C)     TempSrc: Oral     SpO2:  100% 93% 100%    Gen: Awake, alert, chronically ill-appearing, elderly, frail HEENT: Hard of hearing. CV: Regular, normal S1, S2, no murmurs  Resp: Normal WOB, scattered rales bilaterally Abd: Flat, normoactive, mild suprapubic tenderness MSK: Symmetric, no edema  Skin: There is very faint erythema overlying the dorsal aspect of the right wrist, mild increased warmth. ? Cord in the upper arm  Neuro: Alert and interactive  Psych: euthymic, appropriate    Data review:   Labs reviewed, notable for:   Lactate 2.1 -> 0.9 Bicarb 19, AG 13 Creatinine 1.5 similar to prior (baseline 1.2-1.3) WBC 16 Hemoglobin 9, macrocytic UA repeated after Foley exchange equivocal, few bacteria, pyuria, moderate leukocytes.   Micro:  Results for orders placed or performed during the hospital encounter of 08/13/23  Resp panel by RT-PCR (RSV, Flu A&B, Covid) Anterior Nasal Swab     Status: None   Collection Time: 08/13/23  9:58 AM   Specimen: Anterior Nasal Swab  Result Value Ref Range Status   SARS Coronavirus 2 by RT PCR NEGATIVE NEGATIVE Final   Influenza A by PCR NEGATIVE NEGATIVE Final   Influenza B by PCR NEGATIVE NEGATIVE Final    Comment: (NOTE) The Xpert Xpress SARS-CoV-2/FLU/RSV plus assay is intended as an aid in the diagnosis of influenza from Nasopharyngeal swab specimens and should not be used as a sole basis for treatment. Nasal washings and aspirates are unacceptable for Xpert Xpress SARS-CoV-2/FLU/RSV testing.  Fact Sheet for Patients: BloggerCourse.com  Fact Sheet for Healthcare Providers: SeriousBroker.it  This test is not yet approved or cleared by the [place]  FDA and has been authorized for  detection and/or diagnosis of SARS-CoV-2 by FDA under an Emergency Use Authorization (EUA). This EUA will remain in effect (meaning this test can be used) for the duration of the COVID-19 declaration under Section 564(b)(1) of  the Act, 21 U.S.C. section 360bbb-3(b)(1), unless the authorization is terminated or revoked.     Resp Syncytial Virus by PCR NEGATIVE NEGATIVE Final    Comment: (NOTE) Fact Sheet for Patients: BloggerCourse.com  Fact Sheet for Healthcare Providers: SeriousBroker.it  This test is not yet approved or cleared by the [place]  FDA and has been authorized for detection and/or diagnosis of SARS-CoV-2 by FDA under an Emergency Use Authorization (EUA). This EUA will remain in effect (meaning this test can be used) for the duration of the COVID-19 declaration under Section 564(b)(1) of the Act, 21 U.S.C. section 360bbb-3(b)(1), unless the authorization is terminated or revoked.  Performed at [facility], [address], [place], [place] [address]   Respiratory (~20 pathogens) panel by PCR     Status: Abnormal   Collection Time: 08/13/23  5:26 PM   Specimen: Nasopharyngeal Swab; Respiratory  Result Value Ref Range Status   Adenovirus NOT DETECTED NOT DETECTED Final   Coronavirus 229E NOT DETECTED NOT DETECTED Final    Comment: (NOTE) The Coronavirus on the Respiratory Panel, DOES NOT test for the novel  Coronavirus (2019 nCoV)    Coronavirus HKU1 NOT DETECTED NOT DETECTED Final   Coronavirus NL63 NOT DETECTED NOT DETECTED Final   Coronavirus OC43 NOT DETECTED NOT DETECTED Final   Metapneumovirus DETECTED (A) NOT DETECTED Final   Rhinovirus / Enterovirus NOT DETECTED NOT DETECTED Final   Influenza A NOT DETECTED NOT DETECTED Final   Influenza B NOT DETECTED NOT DETECTED Final   Parainfluenza Virus 1 NOT DETECTED NOT DETECTED Final   Parainfluenza Virus 2 NOT DETECTED NOT DETECTED Final    Parainfluenza Virus 3 NOT DETECTED NOT DETECTED Final   Parainfluenza Virus 4 NOT DETECTED NOT DETECTED Final   Respiratory Syncytial Virus NOT DETECTED NOT DETECTED Final   Bordetella pertussis NOT DETECTED NOT DETECTED Final   Bordetella Parapertussis NOT DETECTED NOT DETECTED Final   Chlamydophila pneumoniae NOT DETECTED NOT DETECTED Final   Mycoplasma pneumoniae NOT DETECTED NOT DETECTED Final    Comment: Performed at [facility], [address], [place], [place] [address]   *Note: Due to a large number of results and/or encounters for the requested time period, some results have not been displayed. A complete set of results can be found in Results Review.    Imaging reviewed:  DG Wrist Complete Right Result Date: 08/23/2023 CLINICAL DATA:  Pain and stiffness, initial encounter EXAM: RIGHT WRIST - COMPLETE 3+ VIEW COMPARISON:  None Available. FINDINGS: Significant degenerative changes at the first [facility] joint are noted. No acute fracture or dislocation is noted. Degenerative changes in the radiocarpal joint are seen as well. No soft tissue abnormality is seen. IMPRESSION: Degenerative change without acute abnormality. Electronically Signed   By: Violeta Grey M.D.   On: 08/23/2023 19:14   DG Chest Portable 1 View Result Date: 08/23/2023 CLINICAL DATA:  Altered mental status EXAM: PORTABLE CHEST 1 VIEW COMPARISON:  08/13/2023 FINDINGS: Cardiac shadow is enlarged but stable. Postsurgical changes are seen. Aortic calcifications are noted. Chronic fibrotic changes are noted right greater than left stable from the prior exam. No acute infiltrate or effusion is noted. No bony abnormality is seen. IMPRESSION: Chronic changes without acute abnormality. Electronically Signed   By: Violeta Grey M.D.   On: 08/23/2023 19:13    EKG:   Personally reviewed, SR, LVH, Qtc 560, diffuse T wave flattening   ED Course:  Treated with 1 L IVF, Ceftriaxone   Assessment/Plan:  89 y.o. male with hx  dementia, CAD, CABG, HFpEF, CVA, PE on anticoagulation, lung cancer, hypertension, CKD 3B, COPD, BPH with chronic Foley, recent admission to [facility] from 5/11-19 for metapneumovirus pneumonia and heart failure exacerbation, who presents with worsening pain in his right hand. Brought in for observation with initial hypotension, possible cellulitis R hand.   Question of cellulitis R hand  ? Urinary tract infection  SIRS v sepsis, suspect from hypovolemia  1 day of pain and redness over the dorsum of the right wrist.  On initial evaluation tachycardic in low 100s, tachypneic in the low 20s.  Afebrile.  Exam with minimal erythema and warmth over the dorsum of the right wrist which is nontender and painless range of motion.  WBC 16.  Lactate 2.1-> 0.9 with IV fluids.  X-ray of the right wrist with degenerative changes, no acute findings.  Suspect more so hypovolemic after recent diuresis and restarting on Lasix .  Question of cellulitis although exam not very impressive, may even be superficial thrombophlebitis from IV sites. Less likely other arthropathy such as gout. Otherwise evaluation notable for possible UTI per below. -S/p ceftriaxone  in the ED.  For now continue CTX 1 g IV every 24 hours which will provide coverage for cellulitis and possible UTI per below. -S/p 1 L IV fluid, remains with borderline BP.  Given additional 500 cc then continue oral hydration -Follow-up blood cultures  Question of urinary tract infection Initial UA taken off chronic Foley bag.  Repeat after Foley exchanges equivocal with few bacteria, pyuria, moderate leukocyte esterase.  No history urinary symptoms.  Mild suprapubic tenderness on exam. -For now antibiotic coverage per above.  Follow-up urine culture.  Recent PICC RUE, ? Cord on exam  -Duplex right upper extremity rule out DVT  Chronic medical problems: Dementia: Not on medication CAD, CABG: On DOAC monotherapy, continue home rosuvastatin  HFpEF: Recent admission for  heart failure exacerbation, currently dry on exam.  Hold his home Lasix , metoprolol .  History of CVA: CT anticoagulation/statin above. History of PE: On apixaban . Remote history lung cancer: Prior stage Ib adenocarcinoma right lower lobe, s/P radiation Hypertension: Hold home metoprolol  CKD 3B: Baseline creatinine 1.2-1.3 COPD: Continue home Symbicort  equivalent, nebs as needed BPH with chronic Foley: Foley exchanged in ED. Continue home tamsulosin   GERD: Continue home PPI  Mood d/o: Continue home Fluoxetine    There is no height or weight on file to calculate BMI.    DVT prophylaxis:  Eliquis  Code Status:  DNR/DNI(Do NOT Intubate); confirmed with family  Diet:  Diet Orders (From admission, onward)     Start     Ordered   08/23/23 2226  Diet regular Room service appropriate? Yes; Fluid consistency: Nectar Thick  Diet effective now       Question Answer Comment  Room service appropriate? Yes   Fluid consistency: Nectar Thick      08/23/23 2225           Family Communication: Yes discussed with patient's son at bedside Consults: None Admission status:   Observation, Telemetry bed  Severity of Illness: The appropriate patient status for this patient is OBSERVATION. Observation status is judged to be reasonable and necessary in order to provide the required intensity of service to ensure the patient's safety. The patient's presenting symptoms, physical exam findings, and initial radiographic and laboratory data in the context of their medical condition is felt to place them at decreased risk for further clinical deterioration. Furthermore, it is anticipated that the patient  will be medically stable for discharge from the hospital within 2 midnights of admission.    Arnulfo Larch, MD [facility]  How to contact the [facility] Attending or Consulting provider 7A - 7P or covering provider during after hours 7P -7A, for this patient.  Check the care team in [facility] and look for a)  attending/consulting [facility] provider listed and b) the [facility] team listed Log into [email or web address] and use [place]'s universal password to access. If you do not have the password, please contact the hospital operator. Locate the [facility] provider you are looking for under [facility] and page to a number that you can be directly reached. If you still have difficulty reaching the provider, please page the [facility] (Director on Call) for the Hospitalists listed on amion for assistance.  08/23/2023, 11:53 PM

## 2023-08-23 NOTE — ED Triage Notes (Signed)
 Pt arrived POV with concerns of right arm swelling and worsening confusion. Discharged Monday from the hospital for pneumonia/CHF exacerbation and family states pt had a PICC line placed RUE. Right arm sensitive to touch, mild redness noted.

## 2023-08-24 ENCOUNTER — Other Ambulatory Visit (HOSPITAL_COMMUNITY): Payer: Self-pay

## 2023-08-24 ENCOUNTER — Observation Stay (HOSPITAL_BASED_OUTPATIENT_CLINIC_OR_DEPARTMENT_OTHER)

## 2023-08-24 ENCOUNTER — Observation Stay (HOSPITAL_COMMUNITY)

## 2023-08-24 DIAGNOSIS — E861 Hypovolemia: Secondary | ICD-10-CM | POA: Insufficient documentation

## 2023-08-24 DIAGNOSIS — R609 Edema, unspecified: Secondary | ICD-10-CM

## 2023-08-24 DIAGNOSIS — L03113 Cellulitis of right upper limb: Secondary | ICD-10-CM | POA: Diagnosis not present

## 2023-08-24 LAB — BASIC METABOLIC PANEL WITH GFR
Anion gap: 10 (ref 5–15)
BUN: 26 mg/dL — ABNORMAL HIGH (ref 8–23)
CO2: 22 mmol/L (ref 22–32)
Calcium: 8.4 mg/dL — ABNORMAL LOW (ref 8.9–10.3)
Chloride: 103 mmol/L (ref 98–111)
Creatinine, Ser: 1.15 mg/dL (ref 0.61–1.24)
GFR, Estimated: 59 mL/min — ABNORMAL LOW (ref >60)
Glucose, Bld: 91 mg/dL (ref 70–99)
Potassium: 3.6 mmol/L (ref 3.5–5.1)
Sodium: 135 mmol/L (ref 135–145)

## 2023-08-24 LAB — CBC
HCT: 26.2 % — ABNORMAL LOW (ref 39.0–52.0)
Hemoglobin: 8.8 g/dL — ABNORMAL LOW (ref 13.0–17.0)
MCH: 33.8 pg (ref 26.0–34.0)
MCHC: 33.6 g/dL (ref 30.0–36.0)
MCV: 100.8 fL — ABNORMAL HIGH (ref 80.0–100.0)
Platelets: 108 10*3/uL — ABNORMAL LOW (ref 150–400)
RBC: 2.6 MIL/uL — ABNORMAL LOW (ref 4.22–5.81)
RDW: 13.9 % (ref 11.5–15.5)
WBC: 6.6 10*3/uL (ref 4.0–10.5)
nRBC: 0 % (ref 0.0–0.2)

## 2023-08-24 LAB — MAGNESIUM: Magnesium: 1.9 mg/dL (ref 1.7–2.4)

## 2023-08-24 LAB — PHOSPHORUS: Phosphorus: 3 mg/dL (ref 2.5–4.6)

## 2023-08-24 MED ORDER — CHLORHEXIDINE GLUCONATE CLOTH 2 % EX PADS
6.0000 | MEDICATED_PAD | Freq: Every day | CUTANEOUS | Status: DC
  Administered 2023-08-24: 6 via TOPICAL

## 2023-08-24 MED ORDER — SODIUM CHLORIDE 0.9 % IV SOLN: 1.0000 g | INTRAVENOUS | Status: DC

## 2023-08-24 MED ORDER — CEPHALEXIN 500 MG PO CAPS
500.0000 mg | ORAL_CAPSULE | Freq: Three times a day (TID) | ORAL | 0 refills | Status: AC
  Filled 2023-08-24: qty 27, 9d supply, fill #0

## 2023-08-24 NOTE — Evaluation (Signed)
 Physical Therapy Evaluation Patient Details Name: Evan [name] MRN: [id number] DOB: [birth year] Today's Date: 08/24/2023  History of Present Illness  89 yo male presents to [facility] on 5/21 with R hand pain. PMH includes  dementia, CAD, CABG, HFpEF, CVA, PE on anticoagulation, lung cancer, hypertension, CKD 3B, COPD, BPH with chronic Foley, recent admission to [facility] from 5/11-19 for metapneumovirus pneumonia and heart failure exacerbation.  Clinical Impression   Pt presents with generalized weakness with R hemiparesis due to chronic CVA, impaired balance, impaired activity tolerance. Pt to benefit from acute PT to address deficits. Pt ambulated short room distance with mod +2 safety, pt with RLE ataxia and could not place RUE on RW given pain (pt also uses bilat platform RW at baseline). Pt's family state pt is doing well today vs yesterday mobility-wise, and they would like to d/c with [facility] services resumed. PT to progress mobility as tolerated, and will continue to follow acutely.      SPO2 96-98% throughout on RA     If plan is discharge home, recommend the following: A lot of help with walking and/or transfers;A lot of help with bathing/dressing/bathroom;Assist for transportation;Help with stairs or ramp for entrance   Can travel by private vehicle        Equipment Recommendations None recommended by PT  Recommendations for Other Services       Functional Status Assessment Patient has had a recent decline in their functional status and demonstrates the ability to make significant improvements in function in a reasonable and predictable amount of time.     Precautions / Restrictions Precautions Precautions: Fall Required Braces or Orthoses: Other Brace Other Brace: R LE metal upright AFO from home Restrictions Weight Bearing Restrictions Per Provider Order: No      Mobility  Bed Mobility Overal bed mobility: Needs Assistance Bed Mobility: Supine to Sit, Sit to Supine     Supine to  sit: Min assist, HOB elevated, Used rails Sit to supine: Mod assist   General bed mobility comments: assist for trunk elevation off of bed, LE lift into bed, and boost up upon return to supine.    Transfers Overall transfer level: Needs assistance Equipment used: Rolling walker (2 wheels), 1 person hand held assist Transfers: Sit to/from Stand Sit to Stand: Min assist           General transfer comment: assist for power up, rise, steady. Pt using L hand on RW and PT supporting RUE at elbow/guiding RW. stand x3, from EOB x2 and chair in hallway x1.    Ambulation/Gait Ambulation/Gait assistance: Mod assist, +2 safety/equipment Gait Distance (Feet): 15 Feet (x2; seated rest break) Assistive device: Rolling walker (2 wheels), 1 person hand held assist (RLE AFO) Gait Pattern/deviations: Step-to pattern, Decreased stride length, Wide base of support, Shuffle, Ataxic, Scissoring Gait velocity: decr     General Gait Details: assist to steady, guide RW. cues to slow down and wait, as pt RLE ataxic  and pt with poor awareness/control of this  Stairs            Wheelchair Mobility     Tilt Bed    Modified Rankin (Stroke Patients Only)       Balance Overall balance assessment: Needs assistance Sitting-balance support: Feet supported Sitting balance-[name] Scale: Fair     Standing balance support: Bilateral upper extremity supported Standing balance-[name] Scale: Poor Standing balance comment: min to mod A for posterior bias  Pertinent Vitals/Pain Pain Assessment Pain Assessment: Faces Faces Pain Scale: Hurts even more Pain Location: R hand, with ROM and WB Pain Descriptors / Indicators: Guarding, Grimacing Pain Intervention(s): Limited activity within patient's tolerance, Repositioned, Monitored during session    Home Living Family/patient expects to be discharged to:: Private residence Living Arrangements:  Alone;Children Available Help at Discharge: Available 24 hours/day Type of Home: House Home Access: Ramped entrance       Home Layout: Two level;Able to live on main level with bedroom/bathroom Home Equipment: Shower seat;Grab bars - tub/shower;Hospital bed;Wheelchair - manual;Hand held shower head;Other (comment) Additional Comments: uses Bil plaftorm RW at baseline; R metal upright AFO    Prior Function Prior Level of Function : Needs assist             Mobility Comments: Family provides physical assist with all gait, uses bil platform RW in the house with wheelchair follow. Per family, pt walks about 10 ft at a time ADLs Comments: Family assists with all ADLs; pt can feed himself     Extremity/Trunk Assessment   Upper Extremity Assessment Upper Extremity Assessment: Defer to OT evaluation RUE Deficits / Details: hemiparesis from prior CVA, holds RUE in flexor synergy    Lower Extremity Assessment Lower Extremity Assessment: Generalized weakness;RLE deficits/detail RLE Deficits / Details: hemiparesis from prior CVA, wears AFO during gait with ataxia noted RLE Coordination: decreased gross motor    Cervical / Trunk Assessment Cervical / Trunk Assessment: Kyphotic  Communication   Communication Communication: Impaired Factors Affecting Communication: Hearing impaired    Cognition Arousal: Alert Behavior During Therapy: WFL for tasks assessed/performed   PT - Cognitive impairments: History of cognitive impairments                       PT - Cognition Comments: pleasant, cooperative, memory impairment which PT anticipates is baseline. Following commands: Intact       Cueing Cueing Techniques: Verbal cues, Gestural cues     General Comments General comments (skin integrity, edema, etc.): SPO2 96-98% throughout on RA    Exercises     Assessment/Plan    PT Assessment Patient needs continued PT services  PT Problem List Decreased strength;Decreased  mobility;Decreased activity tolerance;Decreased balance;Decreased knowledge of use of DME;Decreased safety awareness;Decreased knowledge of precautions;Pain       PT Treatment Interventions DME instruction;Therapeutic exercise;Gait training;Balance training;Functional mobility training;Therapeutic activities;Patient/family education;Stair training    PT Goals (Current goals can be found in the Care Plan section)  Acute Rehab PT Goals Patient Stated Goal: home PT Goal Formulation: With patient/family Time For Goal Achievement: 09/07/23 Potential to Achieve Goals: Good    Frequency Min 2X/week     Co-evaluation               AM-PAC PT "6 Clicks" Mobility  Outcome Measure Help needed turning from your back to your side while in a flat bed without using bedrails?: A Little Help needed moving from lying on your back to sitting on the side of a flat bed without using bedrails?: A Lot Help needed moving to and from a bed to a chair (including a wheelchair)?: A Lot Help needed standing up from a chair using your arms (e.g., wheelchair or bedside chair)?: A Lot Help needed to walk in hospital room?: A Lot Help needed climbing 3-5 steps with a railing? : Total 6 Click Score: 12    End of Session Equipment Utilized During Treatment: Gait belt Activity Tolerance: Patient limited by  fatigue Patient left: in bed;with call bell/phone within reach;with family/visitor present;with bed alarm set Nurse Communication: Mobility status PT Visit Diagnosis: Other abnormalities of gait and mobility (R26.89);Ataxic gait (R26.0)    Time: 1610-9604 PT Time Calculation (min) (ACUTE ONLY): 27 min   Charges:   PT Evaluation $PT Eval Low Complexity: 1 Low PT Treatments $Gait Training: 8-22 mins PT General Charges $$ ACUTE PT VISIT: 1 Visit         Shirlene Doughty, PT DPT Acute Rehabilitation Services Secure Chat Preferred  Office [phone]   Ethridge Herder 08/24/2023, 3:44 PM

## 2023-08-24 NOTE — Care Management Obs Status (Signed)
 MEDICARE OBSERVATION STATUS NOTIFICATION   Patient Details  Name: GRANT SWAGER MRN: [id number] Date of Birth: 1928/04/03   Medicare Observation Status Notification Given:  Yes    Terre Ferri, RN 08/24/2023, 3:57 PM

## 2023-08-24 NOTE — Progress Notes (Signed)
 [name] to be D/C'd  per MD order.  Discussed with the patient and all questions fully answered.  VSS, Skin clean, dry and intact without evidence of skin break down, no evidence of skin tears noted.  IV catheter discontinued intact. Site without signs and symptoms of complications. Dressing and pressure applied.  An After Visit Summary was printed and given to the patient. Patient received prescription from [facility].  D/c education completed with patient/family including follow up instructions, medication list, d/c activities limitations if indicated, with other d/c instructions as indicated by MD - patient able to verbalize understanding, all questions fully answered. Pt d'c home with chronic foley.  Patient instructed to return to ED, call 911, or call MD for any changes in condition.   Patient to be escorted via WC, and D/C home via private auto.

## 2023-08-24 NOTE — ED Notes (Signed)
 6N given courtesy call.

## 2023-08-24 NOTE — Discharge Summary (Addendum)
 Physician Discharge Summary  Evan [name] NWG:[id number] DOB: Mar 07, 1928 DOA: 08/23/2023  PCP: Austine Lefort, MD  Admit date: 08/23/2023 Discharge date: 08/24/2023 30 Day Unplanned Readmission Risk Score    Flowsheet Row ED to Hosp-Admission (Discharged) from 08/13/2023 in [facility] 3E HF PCU  30 Day Unplanned Readmission Risk Score (%) 24.89 Filed at 08/21/2023 1200       This score is the patient's risk of an unplanned readmission within 30 days of being discharged (0 -100%). The score is based on dignosis, age, lab data, medications, orders, and past utilization.   Low:  0-14.9   Medium: 15-21.9   High: 22-29.9   Extreme: 30 and above          Admitted From: Home Disposition: Home  Recommendations for Outpatient Follow-up:  Follow up with PCP in 1-2 weeks Please obtain BMP/CBC in one week Please follow up with your PCP on the following pending results: Unresulted Labs (From admission, onward)     Start     Ordered   08/23/23 2225  Remove and replace urinary cath (placed > 5 days) then obtain urine culture from new indwelling urinary catheter.  (Urine Culture)  Once,   R       Question:  Indication  Answer:  Suprapubic pain   08/23/23 2224              Home Health: Yes Equipment/Devices: None  Discharge Condition: Stable CODE STATUS: Full code Diet recommendation: Cardiac  Following HPI is copied from admitting hospitalist HPI. HPI: History limited by patient's dementia, provided by son at the bedside Evan [name] is a 89 y.o. male with hx of dementia, CAD, CABG, HFpEF, CVA, PE on anticoagulation, lung cancer, hypertension, CKD 3B, COPD, BPH with chronic Foley, recent admission to [facility] from 5/11-19 for metapneumovirus pneumonia and heart failure exacerbation, who presents with worsening pain in his right hand.  Son reports over the past day he is began to complain about pain over the right hand and noted to rash forming on the dorsal aspect.  Also  appearing to have pain higher up in the arm.  Otherwise denies any other recent changes, no fever, chills.  Cough has continued since his last hospitalization.  No other abdominal symptoms, dysuria, other rashes.   Subjective: Seen and examined earlier today, son at the bedside.  Patient was fully alert and oriented.  He denied any complaint, not any pain either.  Brief/Interim Summary: Patient briefly hospitalized due to altered mental status and right upper extremity/hand cellulitis.  Patient did meet severe sepsis criteria based on tachycardia, tachypnea, leukocytosis and lactic acid of> 2.  Patient was hydrated, started on Rocephin  and admitted to hospital service.  Cultures are negative.  Patient's leukocytosis resolved, he is no more tachycardic or tachypneic.  He is hemodynamically stable.  I personally examined the patient, he has very minimal/mild right upper extremity cellulitis with no tenderness or no open sores.  DVT is ruled out with negative Doppler upper extremity as well.  Discussed with the son that the patient is stable for discharge and he can be treated with oral antibiotics.  Although son was hesitant but after a long discussion, he agreed with the discharge.  He further explained that he initially tried to seek medical attention by patient's PCP but since he could not get immediate appointment, he ended up coming to the emergency department.  It is to be noted that patient was discharged 3 days ago.  Please  see discharge summary from 08/21/2023 for details.  Since patient has no further indication for remaining in the hospital, he is being discharged home in stable condition, will resume all home medications and prescribe 9 days of Keflex  to treat right upper extremity cellulitis.  Please see pictures below to have an idea of mildness of the cellulitis.       Discharge plan was discussed with patient and/or family member and they verbalized understanding and agreed with it.   Discharge Diagnoses:  Principal Problem:   Cellulitis of upper extremity Active Problems:   Hypovolemia    Discharge Instructions   Allergies as of 08/24/2023       Reactions   Feldene [piroxicam] Rash   Blistering rash   Neomycin -polymyxin-hc Itching   Statins Other (See Comments)   Myalgias   Latex Rash   Band Aid   Tequin [gatifloxacin] Swelling, Anxiety   Valium [diazepam] Other (See Comments)   Dizziness   Vibramycin  [doxycycline ] Itching, Nausea Only        Medication List     TAKE these medications    acetaminophen  500 MG tablet Commonly known as: TYLENOL  Take 500 mg by mouth 2 (two) times daily as needed for mild pain, moderate pain, fever or headache.   apixaban  2.5 MG Tabs tablet Commonly known as: Eliquis  Take 1 tablet (2.5 mg total) by mouth 2 (two) times daily.   cephALEXin  500 MG capsule Commonly known as: KEFLEX  Take 1 capsule (500 mg total) by mouth 3 (three) times daily for 9 days.   ciprofloxacin  0.3 % ophthalmic solution Commonly known as: CILOXAN  Place 2 drops into both eyes every 4 (four) hours while awake. Administer 1 drop, every 2 hours, while awake, for 2 days. Then 1 drop, every 4 hours, while awake, for the next 5 days. What changed: additional instructions   FeroSul 325 (65 Fe) MG tablet Generic drug: ferrous sulfate  TAKE 1 TABLET EVERY DAY   FLUoxetine  20 MG capsule Commonly known as: PROZAC  TAKE 1 CAPSULE EVERY DAY   folic acid  1 MG tablet Commonly known as: FOLVITE  Take 1 tablet (1 mg total) by mouth daily.   furosemide  40 MG tablet Commonly known as: LASIX  TAKE 1 TABLET EVERY DAY   hydrOXYzine  10 MG/5ML syrup Commonly known as: ATARAX  Take 5 mLs (10 mg total) by mouth every 8 (eight) hours as needed for itching.   melatonin 3 MG Tabs tablet Take 1 tablet (3 mg total) by mouth at bedtime as needed (insomnia).   Mens 50+ Multivitamin Tabs Take 1 tablet by mouth daily.   metoprolol  succinate 25 MG 24 hr  tablet Commonly known as: TOPROL -XL Take 12.5 mg by mouth daily.   mupirocin  ointment 2 % Commonly known as: BACTROBAN  Apply 1 Application topically 2 (two) times daily.   nitroGLYCERIN  0.4 MG SL tablet Commonly known as: NITROSTAT  Place 1 tablet (0.4 mg total) under the tongue every 5 (five) minutes as needed for chest pain.   nystatin  powder Commonly known as: MYCOSTATIN /NYSTOP  Apply a thin layer topically to the affected area 2-3 (two to three) times daily.   pantoprazole  40 MG tablet Commonly known as: PROTONIX  TAKE 1 TABLET EVERY MORNING   rosuvastatin  5 MG tablet Commonly known as: CRESTOR  Take 1 tablet (5 mg total) by mouth at bedtime.   senna-docusate 8.6-50 MG tablet Commonly known as: Stimulant Laxative Take 2 tablets by mouth 2 (two) times daily as needed for constipation   Symbicort  160-4.5 MCG/ACT inhaler Generic drug: budesonide -formoterol  Inhale 2  puffs into the lungs in the morning and at bedtime.   tamsulosin  0.4 MG Caps capsule Commonly known as: FLOMAX  Take 1 capsule (0.4 mg total) by mouth daily with supper. What changed: when to take this   Zinc  Oxide 12.8 % ointment Commonly known as: TRIPLE PASTE Apply topically 4 (four) times daily as needed (skin barrier). What changed:  how much to take when to take this        Follow-up Information     Austine Lefort, MD Follow up in 1 week(s).   Specialty: Family Medicine Contact information: [address] [place] [address] [phone]                Allergies  Allergen Reactions   Feldene [Piroxicam] Rash    Blistering rash     Neomycin -Polymyxin-Hc Itching   Statins Other (See Comments)    Myalgias   Latex Rash    Band Aid   Tequin [Gatifloxacin] Swelling and Anxiety   Valium [Diazepam] Other (See Comments)    Dizziness    Vibramycin  [Doxycycline ] Itching and Nausea Only    Consultations: None   Procedures/Studies: VAS US  UPPER EXTREMITY VENOUS  DUPLEX Result Date: 08/24/2023 UPPER VENOUS STUDY  Patient Name:  Evan [name]  Date of Exam:   08/24/2023 Medical Rec #: [id number]     Accession #:    [id number] Date of Birth: 09-Apr-1927     Patient Gender: M Patient Age:   5 years Exam Location:  [facility] Procedure:      VAS US  UPPER EXTREMITY VENOUS DUPLEX Referring Phys: Arnulfo Larch --------------------------------------------------------------------------------  Indications: Edema Risk Factors: PICC line. Limitations: Poor ultrasound/tissue interface and patient positioning, patient movement. Comparison Study: No prior studies. Performing Technologist: Lerry Ransom RVT  Examination Guidelines: A complete evaluation includes B-mode imaging, spectral Doppler, color Doppler, and power Doppler as needed of all accessible portions of each vessel. Bilateral testing is considered an integral part of a complete examination. Limited examinations for reoccurring indications may be performed as noted.  Right Findings: +----------+------------+---------+-----------+----------+-------+ RIGHT     CompressiblePhasicitySpontaneousPropertiesSummary +----------+------------+---------+-----------+----------+-------+ IJV           Full       Yes       Yes                      +----------+------------+---------+-----------+----------+-------+ Subclavian               Yes       Yes                      +----------+------------+---------+-----------+----------+-------+ Axillary      Full       Yes       Yes                      +----------+------------+---------+-----------+----------+-------+ Brachial      Full                                          +----------+------------+---------+-----------+----------+-------+ Radial        Full                                          +----------+------------+---------+-----------+----------+-------+ Ulnar  Full                                           +----------+------------+---------+-----------+----------+-------+ Cephalic      Full                                          +----------+------------+---------+-----------+----------+-------+ Basilic       Full                                          +----------+------------+---------+-----------+----------+-------+  Left Findings: +----------+------------+---------+-----------+----------+-------+ LEFT      CompressiblePhasicitySpontaneousPropertiesSummary +----------+------------+---------+-----------+----------+-------+ Subclavian               Yes       Yes                      +----------+------------+---------+-----------+----------+-------+  Summary:  Right: No evidence of deep vein thrombosis in the upper extremity. No evidence of superficial vein thrombosis in the upper extremity.  Left: No evidence of thrombosis in the subclavian.  *See table(s) above for measurements and observations.     Preliminary    DG Wrist Complete Right Result Date: 08/23/2023 CLINICAL DATA:  Pain and stiffness, initial encounter EXAM: RIGHT WRIST - COMPLETE 3+ VIEW COMPARISON:  None Available. FINDINGS: Significant degenerative changes at the first [facility] joint are noted. No acute fracture or dislocation is noted. Degenerative changes in the radiocarpal joint are seen as well. No soft tissue abnormality is seen. IMPRESSION: Degenerative change without acute abnormality. Electronically Signed   By: Violeta Grey M.D.   On: 08/23/2023 19:14   DG Chest Portable 1 View Result Date: 08/23/2023 CLINICAL DATA:  Altered mental status EXAM: PORTABLE CHEST 1 VIEW COMPARISON:  08/13/2023 FINDINGS: Cardiac shadow is enlarged but stable. Postsurgical changes are seen. Aortic calcifications are noted. Chronic fibrotic changes are noted right greater than left stable from the prior exam. No acute infiltrate or effusion is noted. No bony abnormality is seen. IMPRESSION: Chronic changes without acute abnormality.  Electronically Signed   By: Violeta Grey M.D.   On: 08/23/2023 19:13   DG Swallowing Func-Speech Pathology Result Date: 08/14/2023 Table formatting from the original result was not included. Modified Barium Swallow Study Patient Details Name: Evan [name] MRN: [id number] Date of Birth: 1928-03-01 Today's Date: 08/14/2023 HPI/PMH: HPI: KIING DEAKIN is a 89 y.o. male who presented with shortness of breath and recent pneumonia diagnosis. CXR 5/11: "Patchy bibasilar reticulonodular opacities, favored slightly  increased since June 2024. Findings likely reflect a combination of  aspiration, atelectasis or infection superimposed on a background of  chronic scarring."  Pt known to this service from prior admission.  MBSS in Dec largely WFL with pna at that time, but had sick contacts.   MBSS in 2023 and 2020 as well all with recs to cont regular/thin, but with some precuations/compensatory strategies. Pt with medical history significant of hypertension, CKD 3B, CVA, CAD status post CABG and stent, GERD, anemia, chronic diastolic CHF, anxiety, depression, dementia, lung cancer, BPH, COPD. Clinical Impression: Clinical Impression: Patient presents with a mild oropharyngeal dysphagia characterized by midlly prolonged oral transit of bolus with regular solids however remains  functional. Largest deficit is a delay in swallow initiation to the pyriform sinuses which result in deep penetration and aspiration of thin liquids which is not consistently sensed. Trials of nectar thick liquid significantly increased airway protection wtih only subtle penetration noted with large bolus via straw. Laryngeal/pharyngeal strength intact. Appearance of a shadow, question calcification along posterior pharyngeal wall, above UES, moves partially with movement of other swallowing structures and does not appear to be impacting swallowing function overall, defer further w/u to MD. At this time, recommend regular solids with nectar thick liquids.  SLP will f/u. Factors that may increase risk of adverse event in presence of aspiration Roderick Civatte & Jessy Morocco 2021): Factors that may increase risk of adverse event in presence of aspiration Roderick Civatte & Jessy Morocco 2021): Respiratory or GI disease Recommendations/Plan: Swallowing Evaluation Recommendations Swallowing Evaluation Recommendations Recommendations: PO diet PO Diet Recommendation: Regular; Mildly thick liquids (Level 2, nectar thick) Liquid Administration via: Cup; No straw Medication Administration: Whole meds with puree Supervision: Patient able to self-feed; Full supervision/cueing for swallowing strategies Swallowing strategies  : Slow rate; Small bites/sips Postural changes: Position pt fully upright for meals Oral care recommendations: Oral care BID (2x/day) Treatment Plan Treatment Plan Treatment recommendations: Therapy as outlined in treatment plan below Functional status assessment: Patient has had a recent decline in their functional status and demonstrates the ability to make significant improvements in function in a reasonable and predictable amount of time. Treatment frequency: Min 2x/week Treatment duration: 2 weeks Interventions: Aspiration precaution training; Compensatory techniques; Patient/family education; Trials of upgraded texture/liquids; Diet toleration management by SLP Recommendations Recommendations for follow up therapy are one component of a multi-disciplinary discharge planning process, led by the attending physician.  Recommendations may be updated based on patient status, additional functional criteria and insurance authorization. Assessment: Orofacial Exam: Orofacial Exam Oral Cavity: Oral Hygiene: Dried secretions Oral Cavity - Dentition: Adequate natural dentition Orofacial Anatomy: WFL Oral Motor/Sensory Function: -- (some left sided assymetry of the face, otherwise WFL) Anatomy: Anatomy: Other (Comment) (appearance of a shadow, question calcification along posterior pharyngeal  wall, above UES, moves partially with movement of other swallowing structures and does not appear to be impacting swallowing function overall, defer further w/u to MBS) Boluses Administered: Boluses Administered Boluses Administered: Thin liquids (Level 0); Mildly thick liquids (Level 2, nectar thick); Puree; Solid  Oral Impairment Domain: Oral Impairment Domain Lip Closure: Escape beyond mid-chin Tongue control during bolus hold: Cohesive bolus between tongue to palatal seal Bolus preparation/mastication: Slow prolonged chewing/mashing with complete recollection Bolus transport/lingual motion: Delayed initiation of tongue motion (oral holding) Oral residue: Trace residue lining oral structures Location of oral residue : Tongue Initiation of pharyngeal swallow : Pyriform sinuses  Pharyngeal Impairment Domain: Pharyngeal Impairment Domain Soft palate elevation: No bolus between soft palate (SP)/pharyngeal wall (PW) Laryngeal elevation: Complete superior movement of thyroid  cartilage with complete approximation of arytenoids to epiglottic petiole Anterior hyoid excursion: Complete anterior movement Epiglottic movement: Complete inversion Laryngeal vestibule closure: Incomplete, narrow column air/contrast in laryngeal vestibule Pharyngeal stripping wave : -- (difficult to view with calcification) Pharyngeal contraction (A/P view only): N/A Pharyngoesophageal segment opening: Complete distension and complete duration, no obstruction of flow Tongue base retraction: No contrast between tongue base and posterior pharyngeal wall (PPW) Pharyngeal residue: Complete pharyngeal clearance  Esophageal Impairment Domain: Esophageal Impairment Domain Esophageal clearance upright position: Complete clearance, esophageal coating Pill: Pill Consistency administered: Puree Puree: WFL Penetration/Aspiration Scale Score: Penetration/Aspiration Scale Score 1.  Material does not enter airway: Puree; Pill; Solid 3.  Material enters airway,  remains ABOVE vocal cords and not ejected out: Mildly thick liquids (Level 2, nectar thick) 5.  Material enters airway, CONTACTS cords and not ejected out: Thin liquids (Level 0) 7.  Material enters airway, passes BELOW cords and not ejected out despite cough attempt by patient: Thin liquids (Level 0) 8.  Material enters airway, passes BELOW cords without attempt by patient to eject out (silent aspiration) : Thin liquids (Level 0) Compensatory Strategies: Compensatory Strategies Compensatory strategies: Yes Chin tuck: Ineffective Ineffective Chin Tuck: Thin liquid (Level 0)   General Information: Caregiver present: No  Diet Prior to this Study: Regular; Thin liquids (Level 0)   Temperature : Normal   Respiratory Status: WFL   Supplemental O2: Nasal cannula   History of Recent Intubation: No  Behavior/Cognition: Alert; Cooperative; Pleasant mood; Requires cueing Self-Feeding Abilities: Able to self-feed No data recorded Volitional Cough: Able to elicit Volitional Swallow: Able to elicit No data recorded Goal Planning: Prognosis for improved oropharyngeal function: Fair Barriers to Reach Goals: Other (Comment) (baseline dysphagia and advanced age) No data recorded Patient/Family Stated Goal: MBS Consulted and agree with results and recommendations: Patient (family not in room after study) Pain: Pain Assessment Pain Assessment: No/denies pain Breathing: 0 Negative Vocalization: 0 Facial Expression: 0 Body Language: 0 Consolability: 0 PAINAD Score: 0 End of Session: Start Time:SLP Start Time (ACUTE ONLY): 1315 Stop Time: SLP Stop Time (ACUTE ONLY): 1329 Time Calculation:SLP Time Calculation (min) (ACUTE ONLY): 14 min Charges: SLP Evaluations $ SLP Speech Visit: 1 Visit SLP Evaluations $BSS Swallow: 1 Procedure $MBS Swallow: 1 Procedure SLP visit diagnosis: SLP Visit Diagnosis: Dysphagia, oropharyngeal phase (R13.12) Past Medical History: Past Medical History: Diagnosis Date  Acute blood loss anemia   Acute bronchitis  05/15/2017  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 07/14/2018  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  Altered mental status 08/21/2018  Anginal chest pain at rest [facility])   Chronic non, controlled on Lorazepam   Anxiety   Arthritis   "all over"   BPH (benign prostatic hyperplasia)   CAD (coronary artery disease)   Cellulitis of arm, right 06/01/2018  Cellulitis of left axilla   Chronic lower back pain   Complication of anesthesia   "problems making water afterwards"  Depression   DJD (degenerative joint disease)   Esophageal ulcer without bleeding   bid ppi indefinitely  Fall 07/16/2018  Family history of adverse reaction to anesthesia   "children w/PONV"  GERD (gastroesophageal reflux disease)   Haemophilus influenzae septicemia (HCC) 03/12/2023  Headache   "couple/week maybe" (09/04/2015)  Hemochromatosis   Possible, elevated iron  stores, hook-like osteophytes on hand films, normal LFTs  Hepatitis   "yellow jaundice as a baby"  History of ASCVD   MULTIVESSEL  Hyperlipidemia   Hypertension   Hypotension   Mild aortic stenosis 06/23/2021  Osteoarthritis   Peripheral vascular disease (HCC)   Pneumonia   "several times; got a little now" (09/04/2015)  Pulmonary embolism (HCC) 02/02/2018  Rash and nonspecific skin eruption 06/01/2018  Rheumatoid arthritis (HCC)   RSV (respiratory syncytial virus pneumonia) 05/15/2022  Subdural hematoma (HCC)   small after fall 09/2016-plavix  held, neurosurgery consulted.  Asymptomatic.    Thromboembolism (HCC) 02/2018  on Lovenox  lifelong since he failed PO Eliquis  Past Surgical History: Past Surgical History: Procedure Laterality Date  ARM SKIN LESION BIOPSY / EXCISION Left 09/02/2015  CATARACT EXTRACTION W/ INTRAOCULAR LENS  IMPLANT, BILATERAL Bilateral   CHOLECYSTECTOMY OPEN    CORNEAL TRANSPLANT  Bilateral   "one at [facility]; one at [facility]"  CORONARY ANGIOPLASTY WITH STENT PLACEMENT    CORONARY ARTERY BYPASS GRAFT  1999   DESCENDING AORTIC ANEURYSM REPAIR W/ STENT    DE stent ostium into the right radial free graft at OM-, 02-2007  ESOPHAGOGASTRODUODENOSCOPY N/A 11/09/2014  Procedure: ESOPHAGOGASTRODUODENOSCOPY (EGD);  Surgeon: Delilah Fend, MD;  Location: [facility] ENDOSCOPY;  Service: Endoscopy;  Laterality: N/A;  INGUINAL HERNIA REPAIR    JOINT REPLACEMENT    LEFT HEART CATH AND CORS/GRAFTS ANGIOGRAPHY N/A 06/27/2017  Procedure: LEFT HEART CATH AND CORS/GRAFTS ANGIOGRAPHY;  Surgeon: Millicent Ally, MD;  Location: [facility] INVASIVE CV LAB;  Service: Cardiovascular;  Laterality: N/A;  NASAL SINUS SURGERY    SHOULDER OPEN ROTATOR CUFF REPAIR Right   TOTAL KNEE ARTHROPLASTY Bilateral  Delsa Fife MA, CCC-SLP McCoy Leah Meryl 08/14/2023, 1:52 PM  DG Chest Port 1 View Result Date: 08/13/2023 CLINICAL DATA:  fever cough EXAM: PORTABLE CHEST 1 VIEW COMPARISON:  March 11, 2023, September 27, 2022 FINDINGS: The cardiomediastinal silhouette is unchanged in contour.Status post median sternotomy. Small bilateral pleural effusions. No pneumothorax. There are patchy bibasilar reticulonodular opacities, many of which are chronic in appearance. Gaseous distension of bowel in the upper abdomen. IMPRESSION: 1. Small bilateral pleural effusions. 2. Patchy bibasilar reticulonodular opacities, favored slightly increased since June 2024. Findings likely reflect a combination of aspiration, atelectasis or infection superimposed on a background of chronic scarring. 3. Gaseous distension of bowel. Electronically Signed   By: Clancy Crimes M.D.   On: 08/13/2023 11:05     Discharge Exam: Vitals:   08/24/23 0800 08/24/23 1224  BP:  (!) 105/58  Pulse: 70 74  Resp: 18 16  Temp:  97.7 F (36.5 C)  SpO2:  95%   Vitals:   08/24/23 0119 08/24/23 0756 08/24/23 0800 08/24/23 1224  BP: (!) 94/58 (!) 110/56  (!) 105/58  Pulse: 80 68 70 74  Resp: 18 17 18 16   Temp: (!) 97.5 F (36.4 C) 98.4 F (36.9 C)  97.7 F (36.5 C)  TempSrc: Oral Oral  Oral  SpO2: 99% 96%   95%    General: Pt is alert, awake, not in acute distress Cardiovascular: RRR, S1/S2 +, no rubs, no gallops Respiratory: CTA bilaterally, no wheezing, no rhonchi Abdominal: Soft, NT, ND, bowel sounds + Extremities: no edema, no cyanosis    The results of significant diagnostics from this hospitalization (including imaging, microbiology, ancillary and laboratory) are listed below for reference.     Microbiology: Recent Results (from the past 240 hours)  Blood culture (routine x 2)     Status: None (Preliminary result)   Collection Time: 08/23/23  5:38 PM   Specimen: BLOOD  Result Value Ref Range Status   Specimen Description BLOOD SITE NOT SPECIFIED  Final   Special Requests   Final    BOTTLES DRAWN AEROBIC AND ANAEROBIC Blood Culture results may not be optimal due to an inadequate volume of blood received in culture bottles   Culture   Final    NO GROWTH < 12 HOURS Performed at [facility], [address], [place], [place] [address]    Report Status PENDING  Incomplete  Blood culture (routine x 2)     Status: None (Preliminary result)   Collection Time: 08/23/23  6:06 PM   Specimen: BLOOD  Result Value Ref Range Status   Specimen Description BLOOD SITE NOT SPECIFIED  Final   Special Requests   Final    BOTTLES  DRAWN AEROBIC AND ANAEROBIC Blood Culture results may not be optimal due to an inadequate volume of blood received in culture bottles   Culture   Final    NO GROWTH < 12 HOURS Performed at [facility], [name] [place], [place] [address]    Report Status PENDING  Incomplete     Labs: BNP (last 3 results) Recent Labs    03/13/23 0720 08/10/23 1633 08/13/23 1020  BNP 612.7* 802* 587.6*   Basic Metabolic Panel: Recent Labs  Lab 08/18/23 1036 08/19/23 0231 08/21/23 1000 08/23/23 1738 08/24/23 0612  NA 136 134* 135 134* 135  K 4.5 4.2 3.7 4.5 3.6  CL 103 100 103 102 103  CO2 22 22 21* 19* 22  GLUCOSE 113* 101* 112* 135* 91  BUN  30* 35* 31* 32* 26*  CREATININE 1.39* 1.42* 1.42* 1.51* 1.15  CALCIUM  9.3 9.3 9.1 9.0 8.4*  MG 2.0  --   --   --  1.9  PHOS  --   --   --   --  3.0   Liver Function Tests: Recent Labs  Lab 08/23/23 1738  AST 24  ALT 12  ALKPHOS 85  BILITOT 0.9  PROT 7.4  ALBUMIN 3.3*   No results for input(s): "LIPASE", "AMYLASE" in the last 168 hours. No results for input(s): "AMMONIA" in the last 168 hours. CBC: Recent Labs  Lab 08/18/23 1036 08/23/23 1738 08/24/23 0612  WBC 6.0 16.1* 6.6  HGB 10.3* 9.1* 8.8*  HCT 30.5* 26.9* 26.2*  MCV 100.7* 102.3* 100.8*  PLT 111* 179 108*   Cardiac Enzymes: No results for input(s): "CKTOTAL", "CKMB", "CKMBINDEX", "TROPONINI" in the last 168 hours. BNP: Invalid input(s): "POCBNP" CBG: No results for input(s): "GLUCAP" in the last 168 hours. D-Dimer No results for input(s): "DDIMER" in the last 72 hours. Hgb A1c No results for input(s): "HGBA1C" in the last 72 hours. Lipid Profile No results for input(s): "CHOL", "HDL", "LDLCALC", "TRIG", "CHOLHDL", "LDLDIRECT" in the last 72 hours. Thyroid  function studies No results for input(s): "TSH", "T4TOTAL", "T3FREE", "THYROIDAB" in the last 72 hours.  Invalid input(s): "FREET3" Anemia work up No results for input(s): "VITAMINB12", "FOLATE", "FERRITIN", "TIBC", "IRON ", "RETICCTPCT" in the last 72 hours. Urinalysis    Component Value Date/Time   COLORURINE YELLOW 08/23/2023 2240   APPEARANCEUR HAZY (A) 08/23/2023 2240   LABSPEC 1.015 08/23/2023 2240   PHURINE 6.0 08/23/2023 2240   GLUCOSEU NEGATIVE 08/23/2023 2240   HGBUR NEGATIVE 08/23/2023 2240   BILIRUBINUR NEGATIVE 08/23/2023 2240   KETONESUR NEGATIVE 08/23/2023 2240   PROTEINUR NEGATIVE 08/23/2023 2240   UROBILINOGEN 0.2 07/13/2014 1804   NITRITE NEGATIVE 08/23/2023 2240   LEUKOCYTESUR MODERATE (A) 08/23/2023 2240   Sepsis Labs Recent Labs  Lab 08/18/23 1036 08/23/23 1738 08/24/23 0612  WBC 6.0 16.1* 6.6   Microbiology Recent  Results (from the past 240 hours)  Blood culture (routine x 2)     Status: None (Preliminary result)   Collection Time: 08/23/23  5:38 PM   Specimen: BLOOD  Result Value Ref Range Status   Specimen Description BLOOD SITE NOT SPECIFIED  Final   Special Requests   Final    BOTTLES DRAWN AEROBIC AND ANAEROBIC Blood Culture results may not be optimal due to an inadequate volume of blood received in culture bottles   Culture   Final    NO GROWTH < 12 HOURS Performed at [facility], [address], [place], [place] [address]  Report Status PENDING  Incomplete  Blood culture (routine x 2)     Status: None (Preliminary result)   Collection Time: 08/23/23  6:06 PM   Specimen: BLOOD  Result Value Ref Range Status   Specimen Description BLOOD SITE NOT SPECIFIED  Final   Special Requests   Final    BOTTLES DRAWN AEROBIC AND ANAEROBIC Blood Culture results may not be optimal due to an inadequate volume of blood received in culture bottles   Culture   Final    NO GROWTH < 12 HOURS Performed at [facility], [address] [place], [place] [address]    Report Status PENDING  Incomplete    FURTHER DISCHARGE INSTRUCTIONS:   Get Medicines reviewed and adjusted: Please take all your medications with you for your next visit with your Primary MD   Laboratory/radiological data: Please request your Primary MD to go over all hospital tests and procedure/radiological results at the follow up, please ask your Primary MD to get all Hospital records sent to his/her office.   In some cases, they will be blood work, cultures and biopsy results pending at the time of your discharge. Please request that your primary care M.D. goes through all the records of your hospital data and follows up on these results.   Also Note the following: If you experience worsening of your admission symptoms, develop shortness of breath, life threatening emergency, suicidal or homicidal thoughts you must seek  medical attention immediately by calling 911 or calling your MD immediately  if symptoms less severe.   You must read complete instructions/literature along with all the possible adverse reactions/side effects for all the Medicines you take and that have been prescribed to you. Take any new Medicines after you have completely understood and accpet all the possible adverse reactions/side effects.    Do not drive when taking Pain medications or sleeping medications (Benzodaizepines)   Do not take more than prescribed Pain, Sleep and Anxiety Medications. It is not advisable to combine anxiety,sleep and pain medications without talking with your primary care practitioner   Special Instructions: If you have smoked or chewed Tobacco  in the last 2 yrs please stop smoking, stop any regular Alcohol   and or any Recreational drug use.   Wear Seat belts while driving.   Please note: You were cared for by a hospitalist during your hospital stay. Once you are discharged, your primary care physician will handle any further medical issues. Please note that NO REFILLS for any discharge medications will be authorized once you are discharged, as it is imperative that you return to your primary care physician (or establish a relationship with a primary care physician if you do not have one) for your post hospital discharge needs so that they can reassess your need for medications and monitor your lab values  Time coordinating discharge: Over 30 minutes  SIGNED:   Modena Andes, MD  [facility] 08/24/2023, 3:49 PM *Please note that this is a verbal dictation therefore any spelling or grammatical errors are due to the "Dragon Medical One" system interpretation. If 7PM-7AM, please contact night-coverage [email or web address]

## 2023-08-24 NOTE — Plan of Care (Signed)

## 2023-08-24 NOTE — Progress Notes (Addendum)
   08/24/23 1611  TOC Brief Assessment  Insurance and Status Reviewed  Patient has primary care physician Yes  Home environment has been reviewed home alone, daughter and son take turns staying with him  Prior level of function: ambulatory with walker  Prior/Current Home Services Current home services  Social Drivers of Health Review SDOH reviewed no interventions necessary  Readmission risk has been reviewed Yes   Spoke to patient , daughter and son at bedside.   Patient active with [facility] prior to admission and wants to continue services. Entered orders and face to face , asked MD to sign   [facility] aware of discharge today, and accepted for HHRN,PT,OT

## 2023-08-24 NOTE — Progress Notes (Signed)
 Right upper extremity venous duplex has been completed. Preliminary results can be found in CV Proc through chart review.   08/24/23 2:06 PM Birda Buffy RVT

## 2023-08-24 NOTE — Plan of Care (Signed)
  Problem: Education: Goal: Knowledge of General Education information will improve Description: Including pain rating scale, medication(s)/side effects and non-pharmacologic comfort measures Outcome: Progressing   Problem: Health Behavior/Discharge Planning: Goal: Ability to manage health-related needs will improve Outcome: Progressing   Problem: Clinical Measurements: Goal: Ability to maintain clinical measurements within normal limits will improve Outcome: Progressing   Problem: Nutrition: Goal: Adequate nutrition will be maintained Outcome: Progressing   Problem: Pain Managment: Goal: General experience of comfort will improve and/or be controlled Outcome: Progressing

## 2023-08-25 ENCOUNTER — Encounter: Payer: Self-pay | Admitting: Family Medicine

## 2023-08-25 ENCOUNTER — Telehealth: Payer: Self-pay

## 2023-08-25 ENCOUNTER — Ambulatory Visit: Admitting: Family Medicine

## 2023-08-25 VITALS — BP 110/62 | HR 85 | Temp 97.6°F | Ht 62.0 in | Wt 129.0 lb

## 2023-08-25 DIAGNOSIS — L03119 Cellulitis of unspecified part of limb: Secondary | ICD-10-CM | POA: Diagnosis not present

## 2023-08-25 DIAGNOSIS — I5022 Chronic systolic (congestive) heart failure: Secondary | ICD-10-CM | POA: Diagnosis not present

## 2023-08-25 DIAGNOSIS — I5021 Acute systolic (congestive) heart failure: Secondary | ICD-10-CM

## 2023-08-25 MED ORDER — PREDNISONE 20 MG PO TABS: ORAL_TABLET | ORAL | 0 refills | Status: DC

## 2023-08-25 NOTE — Progress Notes (Signed)
 Subjective:   I recently saw the patient for shortness of breath and diagnosed him with bilateral pneumonia.  Patient was started on Augmentin  and azithromycin .  However his condition deteriorated further and he went to the emergency room.  He was admitted to the hospital with pneumonia and respiratory failure.  Shortly after discharge from the hospital, the patient returned with erythema and pain in his right wrist.  Ultrasound was performed to rule out DVT.  X-rays were unremarkable.  Patient with treated for possible cellulitis with Keflex .  The erythema has resolved in his right wrist.  He has no additional pain.  Of note his creatinine at discharge 1.19.  He appears a little bit fluid overloaded today.  His traditional creatinine is around 1.3.  He does have bilateral crackles in both lungs however states that he has been coughing more when he lays down at night.  He denies any chest pain or shortness of breath. 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   Altered mental status 08/21/2018   Anginal chest pain at rest [facility]    Chronic non, controlled on Lorazepam    Anxiety    Arthritis    "all over"    BPH (benign prostatic hyperplasia)    CAD (coronary artery disease)    Cellulitis of arm, right 06/01/2018   Cellulitis of left axilla    Chronic lower back pain    Complication of anesthesia    "problems making water afterwards"   Depression    DJD (degenerative joint disease)    Esophageal ulcer without bleeding    bid ppi indefinitely   Fall 07/16/2018   Family history of adverse reaction to anesthesia    "children w/PONV"   GERD (gastroesophageal reflux disease)    Haemophilus influenzae septicemia (HCC) 03/12/2023   Headache    "couple/week maybe" (09/04/2015)    Hemochromatosis    Possible, elevated iron  stores, hook-like osteophytes on hand films, normal LFTs   Hepatitis    "yellow jaundice as a baby"   History of ASCVD    MULTIVESSEL   Hyperlipidemia    Hypertension    Hypotension    Mild aortic stenosis 06/23/2021   Osteoarthritis    Peripheral vascular disease (HCC)    Pneumonia    "several times; got a little now" (09/04/2015)   Pulmonary embolism (HCC) 02/02/2018   Rash and nonspecific skin eruption 06/01/2018   Rheumatoid arthritis (HCC)    RSV (respiratory syncytial virus pneumonia) 05/15/2022   Subdural hematoma (HCC)    small after fall 09/2016-plavix  held, neurosurgery consulted.  Asymptomatic.     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 PO Eliquis    Past Surgical History:  Procedure Laterality Date   ARM SKIN LESION BIOPSY / EXCISION Left 09/02/2015   CATARACT EXTRACTION W/ INTRAOCULAR LENS  IMPLANT, BILATERAL Bilateral    CHOLECYSTECTOMY OPEN     CORNEAL TRANSPLANT Bilateral    "one at [facility]; one at [facility]"   CORONARY ANGIOPLASTY WITH STENT PLACEMENT     CORONARY ARTERY BYPASS GRAFT  1999   DESCENDING AORTIC ANEURYSM REPAIR W/ STENT     DE stent ostium into the right radial free graft at OM-, 02-2007   ESOPHAGOGASTRODUODENOSCOPY N/A 11/09/2014   Procedure: ESOPHAGOGASTRODUODENOSCOPY (EGD);  Surgeon: Delilah Fend, MD;  Location: [facility]  ENDOSCOPY;  Service: Endoscopy;  Laterality: N/A;   INGUINAL HERNIA REPAIR     JOINT REPLACEMENT     LEFT HEART CATH AND CORS/GRAFTS ANGIOGRAPHY N/A 06/27/2017   Procedure: LEFT HEART CATH AND CORS/GRAFTS ANGIOGRAPHY;  Surgeon: Millicent Ally, MD;  Location: [facility] INVASIVE CV LAB;  Service: Cardiovascular;  Laterality: N/A;   NASAL SINUS SURGERY     SHOULDER OPEN ROTATOR CUFF REPAIR Right    TOTAL KNEE ARTHROPLASTY Bilateral    Current Outpatient Medications on File Prior to Visit  Medication Sig Dispense Refill   acetaminophen  (TYLENOL ) 500 MG tablet Take 500 mg by mouth 2  (two) times daily as needed for mild pain, moderate pain, fever or headache.     apixaban  (ELIQUIS ) 2.5 MG TABS tablet Take 1 tablet (2.5 mg total) by mouth 2 (two) times daily. 180 tablet 3   budesonide -formoterol  (SYMBICORT ) 160-4.5 MCG/ACT inhaler Inhale 2 puffs into the lungs in the morning and at bedtime. 10.2 g 12   cephALEXin  (KEFLEX ) 500 MG capsule Take 1 capsule (500 mg total) by mouth 3 (three) times daily for 9 days. 27 capsule 0   ciprofloxacin  (CILOXAN ) 0.3 % ophthalmic solution Place 2 drops into both eyes every 4 (four) hours while awake. Administer 1 drop, every 2 hours, while awake, for 2 days. Then 1 drop, every 4 hours, while awake, for the next 5 days. (Patient taking differently: Place 2 drops into both eyes every 4 (four) hours while awake. Patient is taking as needed.) 5 mL 0   ferrous sulfate  (FEROSUL) 325 (65 FE) MG tablet TAKE 1 TABLET EVERY DAY 90 tablet 0   FLUoxetine  (PROZAC ) 20 MG capsule TAKE 1 CAPSULE EVERY DAY 90 capsule 3   folic acid  (FOLVITE ) 1 MG tablet Take 1 tablet (1 mg total) by mouth daily. 30 tablet 0   furosemide  (LASIX ) 40 MG tablet TAKE 1 TABLET EVERY DAY 90 tablet 3   hydrOXYzine  (ATARAX ) 10 MG/5ML syrup Take 5 mLs (10 mg total) by mouth every 8 (eight) hours as needed for itching. 30 mL 0   melatonin 3 MG TABS tablet Take 1 tablet (3 mg total) by mouth at bedtime as needed (insomnia). 30 tablet 0   metoprolol  succinate (TOPROL -XL) 25 MG 24 hr tablet Take 12.5 mg by mouth daily.     Multiple Vitamins-Minerals (MENS 50+ MULTIVITAMIN) TABS Take 1 tablet by mouth daily.     mupirocin  ointment (BACTROBAN ) 2 % Apply 1 Application topically 2 (two) times daily. 22 g 0   nitroGLYCERIN  (NITROSTAT ) 0.4 MG SL tablet Place 1 tablet (0.4 mg total) under the tongue every 5 (five) minutes as needed for chest pain. 50 tablet 3   nystatin  (MYCOSTATIN /NYSTOP ) powder Apply a thin layer topically to the affected area 2-3 (two to three) times daily. 15 g 0   pantoprazole   (PROTONIX ) 40 MG tablet TAKE 1 TABLET EVERY MORNING 90 tablet 0   rosuvastatin  (CRESTOR ) 5 MG tablet Take 1 tablet (5 mg total) by mouth at bedtime.     senna-docusate (STIMULANT LAXATIVE) 8.6-50 MG tablet Take 2 tablets by mouth 2 (two) times daily as needed for constipation 60 tablet 2   tamsulosin  (FLOMAX ) 0.4 MG CAPS capsule Take 1 capsule (0.4 mg total) by mouth daily with supper. (Patient taking differently: Take 0.4 mg by mouth at bedtime.) 90 capsule 3   Zinc  Oxide (TRIPLE PASTE) 12.8 % ointment Apply topically 4 (four) times daily as needed (skin barrier). (Patient taking differently: Apply 1 Application topically 2 (  two) times daily as needed (skin barrier).) 56.7 g 0   No current facility-administered medications on file prior to visit.   Allergies  Allergen Reactions   Feldene [Piroxicam] Rash    Blistering rash     Neomycin -Polymyxin-Hc Itching   Statins Other (See Comments)    Myalgias   Latex Rash    Band Aid   Tequin [Gatifloxacin] Swelling and Anxiety   Valium [Diazepam] Other (See Comments)    Dizziness    Vibramycin  [Doxycycline ] Itching and Nausea Only   Social History   Socioeconomic History   Marital status: Widowed    Spouse name: Not on file   Number of children: 2   Years of education: 85   Highest education level: 12th grade  Occupational History   Not on file  Tobacco Use   Smoking status: Former    Types: Cigarettes    Passive exposure: Yes   Smokeless tobacco: Former    Types: Chew   Tobacco comments:    "quit chewing in the 1960s"    Verified by Daughter, Zula Hitch  Vaping Use   Vaping status: Never Used  Substance and Sexual Activity   Alcohol  use: No   Drug use: No   Sexual activity: Never  Other Topics Concern   Not on file  Social History Narrative   Not on file   Social Drivers of Health   Financial Resource Strain: Low Risk  (04/20/2023)   Overall Financial Resource Strain (CARDIA)    Difficulty of Paying Living  Expenses: Not hard at all  Food Insecurity: No Food Insecurity (08/24/2023)   Hunger Vital Sign    Worried About Running Out of Food in the Last Year: Never true    Ran Out of Food in the Last Year: Never true  Transportation Needs: No Transportation Needs (08/24/2023)   PRAPARE - Administrator, Civil Service (Medical): No    Lack of Transportation (Non-Medical): No  Physical Activity: Insufficiently Active (04/20/2023)   Exercise Vital Sign    Days of Exercise per Week: 5 days    Minutes of Exercise per Session: 10 min  Stress: No Stress Concern Present (04/20/2023)   [employer] of Occupational Health - Occupational Stress Questionnaire    Feeling of Stress : Not at all  Social Connections: Socially Isolated (08/24/2023)   Social Connection and Isolation Panel [NHANES]    Frequency of Communication with Friends and Family: Twice a week    Frequency of Social Gatherings with Friends and Family: Twice a week    Attends Religious Services: Never    Database administrator or Organizations: No    Attends Banker Meetings: Never    Marital Status: Widowed  Intimate Partner Violence: Not At Risk (08/24/2023)   Humiliation, Afraid, Rape, and Kick questionnaire    Fear of Current or Ex-Partner: No    Emotionally Abused: No    Physically Abused: No    Sexually Abused: No      Review of Systems     Objective:   Physical Exam Vitals reviewed.  Constitutional:      General: He is not in acute distress.    Appearance: Normal appearance. He is not toxic-appearing or diaphoretic.  Cardiovascular:     Rate and Rhythm: Normal rate and regular rhythm.     Pulses:          Dorsalis pedis pulses are 1+ on the right side and 1+ on the left side.  Heart sounds: Normal heart sounds. No murmur heard.    No friction rub. No gallop.  Pulmonary:     Effort: Pulmonary effort is normal. No respiratory distress.     Breath sounds: Examination of the right-middle  field reveals rales. Examination of the right-lower field reveals rales. Examination of the left-lower field reveals rales. Rales present. No wheezing or rhonchi.    Musculoskeletal:     Right lower leg: No edema.     Left lower leg: No edema.  Neurological:     Mental Status: He is alert.   Right-sided hemiparesis with weakness in his right leg.  Weakness with knee flexion and extension and ankle flexion and extension.  Ataxia in the right upper arm and right lower leg.       Assessment & Plan:  Chronic systolic congestive heart failure (HCC)  Acute systolic congestive heart failure (HCC) There is no evidence of any residual cellulitis around the right wrist.  I believe the patient may have had a gout exacerbation based on his previous history.  Thankfully he is growing much better now.  Recommended he complete the Keflex  I did ask his daughter to give him an extra dose of Lasix  tonight as he appears slightly fluid overloaded.  Reassess next week if no better or sooner if worse.  The pneumonia seems to have resolved completely and the patient is back to his baseline.

## 2023-08-25 NOTE — Transitions of Care (Post Inpatient/ED Visit) (Signed)
 08/25/2023  Name: Evan [name] MRN: [id number] DOB: 01-23-1928  Today's TOC FU Call Status: Today's TOC FU Call Status:: Successful TOC FU Call Completed TOC FU Call Complete Date: 08/25/23 Patient's Name and Date of Birth confirmed.  Transition Care Management Follow-up Telephone Call Date of Discharge: 08/24/23 Discharge Facility: [facility]) Type of Discharge: Inpatient Admission Primary Inpatient Discharge Diagnosis:: cellulitis How have you been since you were released from the hospital?: Better Any questions or concerns?: No  Items Reviewed: Did you receive and understand the discharge instructions provided?: Yes Medications obtained,verified, and reconciled?: Yes (Medications Reviewed) Any new allergies since your discharge?: No Dietary orders reviewed?: Yes Do you have support at home?: Yes People in Home [RPT]: child(ren), adult  Medications Reviewed Today: Medications Reviewed Today     Reviewed by Darrall Ellison, LPN (Licensed Practical Nurse) on 08/25/23 at [phone]  Med List Status: <None>   Medication Order Taking? Sig Documenting Provider Last Dose Status Informant  acetaminophen  (TYLENOL ) 500 MG tablet [id number] No Take 500 mg by mouth 2 (two) times daily as needed for mild pain, moderate pain, fever or headache. [provider] 08/23/2023 Active Child, Pharmacy Records  apixaban  (ELIQUIS ) 2.5 MG TABS tablet [id number] No Take 1 tablet (2.5 mg total) by mouth 2 (two) times daily. Gerald Kitty., NP 08/23/2023 Active Child, Pharmacy Records  budesonide -formoterol  (SYMBICORT ) 160-4.5 MCG/ACT inhaler [id number] No Inhale 2 puffs into the lungs in the morning and at bedtime. Austine Lefort, MD 08/23/2023 Active Child, Pharmacy Records  cephALEXin  (KEFLEX ) 500 MG capsule [id number]  Take 1 capsule (500 mg total) by mouth 3 (three) times daily for 9 days. Modena Andes, MD  Active   ciprofloxacin  (CILOXAN ) 0.3 % ophthalmic solution [id number] No Place 2 drops  into both eyes every 4 (four) hours while awake. Administer 1 drop, every 2 hours, while awake, for 2 days. Then 1 drop, every 4 hours, while awake, for the next 5 days.  Patient taking differently: Place 2 drops into both eyes every 4 (four) hours while awake. Patient is taking as needed.   Unk Garb, DO Past Week Active Child, Pharmacy Records  ferrous sulfate  [facility]) 325 (65 FE) MG tablet [id number] No TAKE 1 TABLET EVERY DAY Austine Lefort, MD 08/23/2023 Active Child, Pharmacy Records  FLUoxetine  (PROZAC ) 20 MG capsule [id number] No TAKE 1 CAPSULE EVERY DAY Austine Lefort, MD 08/23/2023 Active Child, Pharmacy Records  folic acid  (FOLVITE ) 1 MG tablet [id number] No Take 1 tablet (1 mg total) by mouth daily. [name], Curlee Doss, MD 08/23/2023 Active Child, Pharmacy Records  furosemide  (LASIX ) 40 MG tablet [id number] No TAKE 1 TABLET EVERY DAY Austine Lefort, MD 08/23/2023 Active Child, Pharmacy Records  hydrOXYzine  (ATARAX ) 10 MG/5ML syrup [id number] No Take 5 mLs (10 mg total) by mouth every 8 (eight) hours as needed for itching. [name], Curlee Doss, MD 08/22/2023 Active Child, Pharmacy Records  melatonin 3 MG TABS tablet [id number] No Take 1 tablet (3 mg total) by mouth at bedtime as needed (insomnia). [name], Curlee Doss, MD 08/22/2023 Active Child, Pharmacy Records  metoprolol  succinate (TOPROL -XL) 25 MG 24 hr tablet [id number] No Take 12.5 mg by mouth daily. [provider] 08/23/2023 Active Child, Pharmacy Records  Multiple Vitamins-Minerals (MENS 50+ MULTIVITAMIN) TABS [id number] No Take 1 tablet by mouth daily. [provider] 08/23/2023 Active Child, Pharmacy Records  mupirocin  ointment (BACTROBAN ) 2 % [id number] No Apply 1 Application topically 2 (two) times daily. Hyatt, [name], DPM Unknown Active Child, Pharmacy  Records  nitroGLYCERIN  (NITROSTAT ) 0.4 MG SL tablet [id number] No Place 1 tablet (0.4 mg total) under the tongue every 5 (five) minutes as needed for chest  pain. Austine Lefort, MD Taking Active Child, Pharmacy Records           Med Note Cornelia Dieter, SEBASTIAN   Wed Aug 23, 2023 10:22 PM) PRN, no recent need/use  nystatin  (MYCOSTATIN /NYSTOP ) powder [id number] No Apply a thin layer topically to the affected area 2-3 (two to three) times daily.  08/22/2023 Active Child, Pharmacy Records  pantoprazole  (PROTONIX ) 40 MG tablet [id number] No TAKE 1 TABLET EVERY MORNING Austine Lefort, MD 08/23/2023 Active Child, Pharmacy Records  rosuvastatin  (CRESTOR ) 5 MG tablet [id number] No Take 1 tablet (5 mg total) by mouth at bedtime. [name], Curlee Doss, MD 08/22/2023 Active Child, Pharmacy Records  senna-docusate (STIMULANT LAXATIVE) 8.6-50 MG tablet [id number] No Take 2 tablets by mouth 2 (two) times daily as needed for constipation  Past Week Active Child, Pharmacy Records  tamsulosin  (FLOMAX ) 0.4 MG CAPS capsule [id number] No Take 1 capsule (0.4 mg total) by mouth daily with supper.  Patient taking differently: Take 0.4 mg by mouth at bedtime.   Austine Lefort, MD 08/22/2023 Active Child, Pharmacy Records  Zinc  Oxide (TRIPLE PASTE) 12.8 % ointment [id number] No Apply topically 4 (four) times daily as needed (skin barrier).  Patient taking differently: Apply 1 Application topically 2 (two) times daily as needed (skin barrier).   Etter Hermann., MD Past Week Active Child, Pharmacy Records            Home Care and Equipment/Supplies: Were Home Health Services Ordered?: Yes Name of Home Health Agency:: [facility] Has Agency set up a time to come to your home?: No Any new equipment or medical supplies ordered?: NA  Functional Questionnaire: Do you need assistance with bathing/showering or dressing?: Yes Do you need assistance with meal preparation?: Yes Do you need assistance with eating?: No Do you have difficulty maintaining continence: Yes Do you need assistance with getting out of bed/getting out of a chair/moving?: Yes Do you have difficulty  managing or taking your medications?: Yes  Follow up appointments reviewed: PCP Follow-up appointment confirmed?: Yes Date of PCP follow-up appointment?: 08/25/23 Follow-up Provider: [facility] Follow-up appointment confirmed?: NA Do you need transportation to your follow-up appointment?: No Do you understand care options if your condition(s) worsen?: Yes-patient verbalized understanding    SIGNATURE Darrall Ellison, LPN [facility] Nurse Health Advisor Direct Dial [phone]

## 2023-08-26 DIAGNOSIS — J123 Human metapneumovirus pneumonia: Secondary | ICD-10-CM | POA: Diagnosis not present

## 2023-08-26 DIAGNOSIS — I13 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 failure and stage 1 through stage 4 chronic kidney disease, or unspecified chronic kidney disease: Secondary | ICD-10-CM | POA: Diagnosis not present

## 2023-08-26 DIAGNOSIS — J440 Chronic obstructive pulmonary disease with acute lower respiratory infection: Secondary | ICD-10-CM | POA: Diagnosis not present

## 2023-08-26 DIAGNOSIS — D631 Anemia in chronic kidney disease: Secondary | ICD-10-CM | POA: Diagnosis not present

## 2023-08-26 DIAGNOSIS — I5033 Acute on chronic diastolic (congestive) heart failure: Secondary | ICD-10-CM | POA: Diagnosis not present

## 2023-08-26 DIAGNOSIS — J690 Pneumonitis due to inhalation of food and vomit: Secondary | ICD-10-CM | POA: Diagnosis not present

## 2023-08-26 DIAGNOSIS — N1832 Chronic kidney disease, stage 3b: Secondary | ICD-10-CM | POA: Diagnosis not present

## 2023-08-26 DIAGNOSIS — C3490 Malignant neoplasm of unspecified part of unspecified bronchus or lung: Secondary | ICD-10-CM | POA: Diagnosis not present

## 2023-08-26 DIAGNOSIS — D630 Anemia in neoplastic disease: Secondary | ICD-10-CM | POA: Diagnosis not present

## 2023-08-28 LAB — CULTURE, BLOOD (ROUTINE X 2)
Culture: NO GROWTH
Culture: NO GROWTH

## 2023-08-29 ENCOUNTER — Telehealth: Payer: Self-pay | Admitting: Family Medicine

## 2023-08-29 ENCOUNTER — Ambulatory Visit (INDEPENDENT_AMBULATORY_CARE_PROVIDER_SITE_OTHER): Payer: Medicare HMO | Admitting: Podiatry

## 2023-08-29 ENCOUNTER — Telehealth: Payer: Self-pay

## 2023-08-29 DIAGNOSIS — J123 Human metapneumovirus pneumonia: Secondary | ICD-10-CM | POA: Diagnosis not present

## 2023-08-29 DIAGNOSIS — N1832 Chronic kidney disease, stage 3b: Secondary | ICD-10-CM | POA: Diagnosis not present

## 2023-08-29 DIAGNOSIS — D631 Anemia in chronic kidney disease: Secondary | ICD-10-CM | POA: Diagnosis not present

## 2023-08-29 DIAGNOSIS — I5033 Acute on chronic diastolic (congestive) heart failure: Secondary | ICD-10-CM | POA: Diagnosis not present

## 2023-08-29 DIAGNOSIS — J440 Chronic obstructive pulmonary disease with acute lower respiratory infection: Secondary | ICD-10-CM | POA: Diagnosis not present

## 2023-08-29 DIAGNOSIS — D630 Anemia in neoplastic disease: Secondary | ICD-10-CM | POA: Diagnosis not present

## 2023-08-29 DIAGNOSIS — C3490 Malignant neoplasm of unspecified part of unspecified bronchus or lung: Secondary | ICD-10-CM | POA: Diagnosis not present

## 2023-08-29 DIAGNOSIS — I509 Heart failure, unspecified: Secondary | ICD-10-CM

## 2023-08-29 DIAGNOSIS — Z91199 Patient's noncompliance with other medical treatment and regimen due to unspecified reason: Secondary | ICD-10-CM

## 2023-08-29 DIAGNOSIS — I13 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 failure and stage 1 through stage 4 chronic kidney disease, or unspecified chronic kidney disease: Secondary | ICD-10-CM | POA: Diagnosis not present

## 2023-08-29 DIAGNOSIS — J690 Pneumonitis due to inhalation of food and vomit: Secondary | ICD-10-CM | POA: Diagnosis not present

## 2023-08-29 DIAGNOSIS — L0390 Cellulitis, unspecified: Secondary | ICD-10-CM

## 2023-08-29 NOTE — Progress Notes (Signed)
 No show

## 2023-08-29 NOTE — Telephone Encounter (Signed)
 Copied from CRM [id number]. Topic: General - Other >> Aug 29, 2023  8:43 AM Emylou G wrote: Reason for CRM: [facility] is relaying they are sending HH orders to be signed.. just wanted to let us  know in advance - we need it before 30 days.

## 2023-08-30 ENCOUNTER — Telehealth: Payer: Self-pay | Admitting: *Deleted

## 2023-08-30 DIAGNOSIS — D630 Anemia in neoplastic disease: Secondary | ICD-10-CM | POA: Diagnosis not present

## 2023-08-30 DIAGNOSIS — J440 Chronic obstructive pulmonary disease with acute lower respiratory infection: Secondary | ICD-10-CM | POA: Diagnosis not present

## 2023-08-30 DIAGNOSIS — D631 Anemia in chronic kidney disease: Secondary | ICD-10-CM | POA: Diagnosis not present

## 2023-08-30 DIAGNOSIS — J123 Human metapneumovirus pneumonia: Secondary | ICD-10-CM | POA: Diagnosis not present

## 2023-08-30 DIAGNOSIS — I13 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 failure and stage 1 through stage 4 chronic kidney disease, or unspecified chronic kidney disease: Secondary | ICD-10-CM | POA: Diagnosis not present

## 2023-08-30 DIAGNOSIS — N1832 Chronic kidney disease, stage 3b: Secondary | ICD-10-CM | POA: Diagnosis not present

## 2023-08-30 DIAGNOSIS — J690 Pneumonitis due to inhalation of food and vomit: Secondary | ICD-10-CM | POA: Diagnosis not present

## 2023-08-30 DIAGNOSIS — C3490 Malignant neoplasm of unspecified part of unspecified bronchus or lung: Secondary | ICD-10-CM | POA: Diagnosis not present

## 2023-08-30 DIAGNOSIS — I5033 Acute on chronic diastolic (congestive) heart failure: Secondary | ICD-10-CM | POA: Diagnosis not present

## 2023-08-30 NOTE — Progress Notes (Signed)
 Complex Care Management Note  Care Guide Note 08/30/2023 Name: Evan [name] MRN: [id number] DOB: 06-Feb-1928  Evan [name] is a 89 y.o. year old male who sees Pickard, Cisco Crest, MD for primary care. I reached out to [name] by phone today to offer complex care management services.  [name] was given information about Complex Care Management services today including:   The Complex Care Management services include support from the care team which includes your Nurse Care Manager, Clinical Social Worker, or Pharmacist.  The Complex Care Management team is here to help remove barriers to the health concerns and goals most important to you. Complex Care Management services are voluntary, and the patient may decline or stop services at any time by request to their care team member.   Complex Care Management Consent Status: Patient agreed to services and verbal consent obtained.   Follow up plan:  Telephone appointment with complex care management team member scheduled for:  09/12/2023  Encounter Outcome:  Patient Scheduled  Kandis Ormond, CMA [place]  [facility], [facility] Guide Direct Dial: [phone]  Fax: [phone] Website: [email or web address]

## 2023-08-31 ENCOUNTER — Other Ambulatory Visit: Payer: Self-pay | Admitting: *Deleted

## 2023-08-31 NOTE — Patient Instructions (Signed)
 Visit Information  Thank you for taking time to visit with me today. Please don't hesitate to contact me if I can be of assistance to you before our next scheduled telephone appointment.  Our next appointment is by telephone on 09/07/23 at 215 pm  Following is a copy of your care plan:   Goals Addressed             This Visit's Progress    VBCI Transitions of Care (TOC) Care Plan       Problems:  Recent Hospitalization for treatment of CHF Home Health services barrier: [facility] has not contacted patient and No Specialist appointment daughter states she is going to talk with primary care provider about choosing a cardiologist Per daughter, pt is not able to weigh, too unsteady, uses walker, lives alone, daughter checks in and sees pt daily, 2 sons spend the night, adult children provide all oversight for care Patient has primary care provider appointment 6/6, there is earlier appointment per collaboration with care guide for 5/27 with NP and daughter does not want to see another provider and declined. Daughter reports no issues with foley catheter, pt uses thick it for liquids and has been doing this for quite some time, pt was on hospice services and discharged recently per daughter (after 6 months), pt has slight diarrhea per daughter and states " but looks like it's getting better, will take something if anything gets worse" 08/31/23- pt had hospital observation 5/21 for cellulitis to right hand, daughter states " looks really good now",  no redness, no drainage, no other new concerns reported, no issues with foley cathter, home health continues  Goal:  Over the next 30 days, the patient will not experience hospital readmission  Interventions:   Heart Failure Interventions: Basic overview and discussion of pathophysiology of Heart Failure reviewed Provided education on low sodium diet Discussed importance of daily weight and advised patient to weigh and record daily Discussed  the importance of keeping all appointments with provider Reinforced signs /symptoms of infection, pneumonia Verified [facility] home [facility] is working with pt Reviewed importance of good nutrition Reviewed safety precautions Reinforced heart failure action plan  Patient Self Care Activities:  Follow Heart Failure action plan, call your doctor early on for change in health status / symptoms (weight gain, swelling in feet, increased shortness of breath, overall not feeling well) Call 911 for unrelieved shortness of breath, chest pain, fainting Be mindful of sodium intake, avoid fast food, salty snacks If you have any signs/ symptoms of infection, let your doctor know immediately Continue working with [facility]- complete prescribed exercises   Plan:  Telephone follow up appointment with care management team member scheduled for:  09/07/23 @ 215 pm The patient has been provided with contact information for the care management team and has been advised to call with any health related questions or concerns.         Patient verbalizes understanding of instructions and care plan provided today and agrees to view in MyChart. Active MyChart status and patient understanding of how to access instructions and care plan via MyChart confirmed with patient.     Telephone follow up appointment with care management team member scheduled for: 09/07/23 @ 215 pm  Please call the care guide team at [phone] if you need to cancel or reschedule your appointment.   Please call the Suicide and Crisis Lifeline: 988 call the [place]  National Suicide Prevention Lifeline: 410 808 3732 or TTY: 954-788-9122 TTY (413)213-4253) to  talk to a trained counselor call 1-800-273-TALK (toll free, 24 hour hotline) go to [facility] Urgent [facility] [address] [place] [phone]) call the [facility] Crisis Line: [phone] call 911 if you are experiencing a Mental Health or  Behavioral Health Crisis or need someone to talk to.  Cecilie Coffee [facility], BSN RN Care Manager/ Transition of Care [place]/ [facility] [phone]

## 2023-08-31 NOTE — Patient Outreach (Signed)
 Transition of Care week 2  Visit Note  08/31/2023  Name: Evan [name] MRN: [id number]          DOB: [birth year]  Situation: Patient enrolled in [facility] 30-day program. Visit completed with patient's daughter Zula Hitch by telephone.   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   Altered mental status 08/21/2018   Anginal chest pain at rest [facility])    Chronic non, controlled on Lorazepam    Anxiety    Arthritis    "all over"    BPH (benign prostatic hyperplasia)    CAD (coronary artery disease)    Cellulitis of arm, right 06/01/2018   Cellulitis of left axilla    Chronic lower back pain    Complication of anesthesia    "problems making water afterwards"   Depression    DJD (degenerative joint disease)    Esophageal ulcer without bleeding    bid ppi indefinitely   Fall 07/16/2018   Family history of adverse reaction to anesthesia    "children w/PONV"   GERD (gastroesophageal reflux disease)    Haemophilus influenzae septicemia (HCC) 03/12/2023   Headache    "couple/week maybe" (09/04/2015)   Hemochromatosis    Possible, elevated iron  stores, hook-like osteophytes on hand films, normal LFTs   Hepatitis    "yellow jaundice as a baby"   History of ASCVD    MULTIVESSEL   Hyperlipidemia    Hypertension    Hypotension    Mild aortic stenosis 06/23/2021   Osteoarthritis    Peripheral vascular disease (HCC)    Pneumonia    "several times; got a little now" (09/04/2015)   Pulmonary embolism (HCC) 02/02/2018   Rash and nonspecific skin eruption 06/01/2018   Rheumatoid arthritis (HCC)    RSV (respiratory syncytial virus pneumonia) 05/15/2022   Subdural hematoma (HCC)    small after fall 09/2016-plavix  held, neurosurgery consulted.  Asymptomatic.      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 PO Eliquis     Assessment: Patient Reported Symptoms: Cognitive Cognitive Status: Difficulties with attention and concentration, Requires Assistance Decision Making, Struggling with memory recall (per daughter, confusion varies day to day)   Healing Pattern: Unsure  Neurological Neurological Review of Symptoms: Hearing changes (wears hearing aides)    HEENT HEENT Symptoms Reported: Other: Other HEENT Symptoms/Conditions: bil hard of hearing HEENT Management Strategies:  (bil hearing aides) HEENT Self-Management Outcome: 3 (uncertain)    Cardiovascular Cardiovascular Symptoms Reported: No symptoms reported Does patient have uncontrolled Hypertension?: No Cardiovascular Conditions: Hypertension, Heart failure, Coronary artery disease Cardiovascular Management Strategies: Adequate rest, Routine screening, Medication therapy Cardiovascular Self-Management Outcome: 3 (uncertain) Cardiovascular Comment: pt does not weigh daily  Respiratory Respiratory Symptoms Reported: Shortness of breath Other Respiratory Symptoms: slight dyspnea with exertion per daughter Respiratory Conditions: COPD Respiratory Self-Management Outcome: 3 (uncertain) Respiratory Comment: uses inhaler as prescribed  Endocrine Patient reports the following symptoms related to hypoglycemia or hyperglycemia : No symptoms reported Is patient diabetic?: No    Gastrointestinal Gastrointestinal Symptoms Reported: No symptoms reported   Nutrition Risk Screen (CP):  (thick it for liquids, Ensure BID)  Genitourinary Genitourinary Symptoms Reported: Incontinence Additional Genitourinary Details: foley catheter patent, intact, draining well Genitourinary Conditions: Chronic kidney disease (3b) Genitourinary Management Strategies: Adequate rest, Medication therapy Genitourinary  Self-Management Outcome: 3 (uncertain)  Integumentary Integumentary Symptoms  Reported: Skin changes Additional Integumentary Details: cellulitis right upper extremity- 5/21  hospital observation, daughter states " looks really good now",  no drainage, no redness Skin Conditions: Other (cellultis) Skin Management Strategies: Routine screening Skin Self-Management Outcome: 3 (uncertain)  Musculoskeletal Musculoskelatal Symptoms Reviewed: Difficulty walking Additional Musculoskeletal Details: walker Musculoskeletal Management Strategies: Routine screening, Adequate rest Musculoskeletal Self-Management Outcome: 3 (uncertain)      Psychosocial Psychosocial Symptoms Reported: Not assessed Additional Psychological Details: unable to assess         There were no vitals filed for this visit.  Medications Reviewed Today     Reviewed by Daralyn Earl, RN (Registered Nurse) on 08/31/23 at 1029  Med List Status: <None>   Medication Order Taking? Sig Documenting Provider Last Dose Status Informant  acetaminophen  (TYLENOL ) 500 MG tablet [id number] No Take 500 mg by mouth 2 (two) times daily as needed for mild pain, moderate pain, fever or headache. [provider] 08/23/2023 Active Child, Pharmacy Records  apixaban  (ELIQUIS ) 2.5 MG TABS tablet [id number] No Take 1 tablet (2.5 mg total) by mouth 2 (two) times daily. Gerald Kitty., NP 08/23/2023 Active Child, Pharmacy Records  budesonide -formoterol  (SYMBICORT ) 160-4.5 MCG/ACT inhaler [id number] No Inhale 2 puffs into the lungs in the morning and at bedtime. Austine Lefort, MD 08/23/2023 Active Child, Pharmacy Records  cephALEXin  (KEFLEX ) 500 MG capsule [id number]  Take 1 capsule (500 mg total) by mouth 3 (three) times daily for 9 days. Modena Andes, MD  Active   ciprofloxacin  (CILOXAN ) 0.3 % ophthalmic solution [id number] No Place 2 drops into both eyes every 4 (four) hours while awake. Administer 1 drop, every 2 hours, while awake, for 2 days. Then 1 drop, every 4 hours, while awake, for the next 5 days.  Patient  taking differently: Place 2 drops into both eyes every 4 (four) hours while awake. Patient is taking as needed.   Unk Garb, DO Past Week Active Child, Pharmacy Records  ferrous sulfate  [facility]) 325 (65 FE) MG tablet [id number] No TAKE 1 TABLET EVERY DAY Austine Lefort, MD 08/23/2023 Active Child, Pharmacy Records  FLUoxetine  (PROZAC ) 20 MG capsule [id number] No TAKE 1 CAPSULE EVERY DAY Austine Lefort, MD 08/23/2023 Active Child, Pharmacy Records  folic acid  (FOLVITE ) 1 MG tablet [id number] No Take 1 tablet (1 mg total) by mouth daily. [name], Curlee Doss, MD 08/23/2023 Active Child, Pharmacy Records  furosemide  (LASIX ) 40 MG tablet [id number] No TAKE 1 TABLET EVERY DAY Austine Lefort, MD 08/23/2023 Active Child, Pharmacy Records  hydrOXYzine  (ATARAX ) 10 MG/5ML syrup [id number] No Take 5 mLs (10 mg total) by mouth every 8 (eight) hours as needed for itching. [name], Curlee Doss, MD 08/22/2023 Active Child, Pharmacy Records  melatonin 3 MG TABS tablet [id number] No Take 1 tablet (3 mg total) by mouth at bedtime as needed (insomnia). [name], Curlee Doss, MD 08/22/2023 Active Child, Pharmacy Records  metoprolol  succinate (TOPROL -XL) 25 MG 24 hr tablet [id number] No Take 12.5 mg by mouth daily. [provider] 08/23/2023 Active Child, Pharmacy Records  Multiple Vitamins-Minerals (MENS 50+ MULTIVITAMIN) TABS [id number] No Take 1 tablet by mouth daily. [provider] 08/23/2023 Active Child, Pharmacy Records  mupirocin  ointment (BACTROBAN ) 2 % [id number] No Apply 1 Application topically 2 (two) times daily. Hyatt, [name], DPM Unknown Active Child, Pharmacy Records  nitroGLYCERIN  (NITROSTAT ) 0.4 MG SL tablet [id number] No Place 1 tablet (0.4 mg total) under the tongue every 5 (five) minutes as needed  for chest pain. Austine Lefort, MD Taking Active Child, Pharmacy Records           Med Note Cornelia Dieter, SEBASTIAN   Wed Aug 23, 2023 10:22 PM) PRN, no recent need/use  nystatin   (MYCOSTATIN /NYSTOP ) powder [id number] No Apply a thin layer topically to the affected area 2-3 (two to three) times daily.  08/22/2023 Active Child, Pharmacy Records  pantoprazole  (PROTONIX ) 40 MG tablet [id number] No TAKE 1 TABLET EVERY MORNING Pickard, Warren T, MD 08/23/2023 Active Child, Pharmacy Records  predniSONE  (DELTASONE ) 20 MG tablet [id number]  3 tabs poqday 1-2, 2 tabs poqday 3-4, 1 tab poqday 5-6 Austine Lefort, MD  Active   rosuvastatin  (CRESTOR ) 5 MG tablet [id number] No Take 1 tablet (5 mg total) by mouth at bedtime. [name], Curlee Doss, MD 08/22/2023 Active Child, Pharmacy Records  senna-docusate (STIMULANT LAXATIVE) 8.6-50 MG tablet [id number] No Take 2 tablets by mouth 2 (two) times daily as needed for constipation  Past Week Active Child, Pharmacy Records  tamsulosin  (FLOMAX ) 0.4 MG CAPS capsule [id number] No Take 1 capsule (0.4 mg total) by mouth daily with supper.  Patient taking differently: Take 0.4 mg by mouth at bedtime.   Austine Lefort, MD 08/22/2023 Active Child, Pharmacy Records  Zinc  Oxide (TRIPLE PASTE) 12.8 % ointment [id number] No Apply topically 4 (four) times daily as needed (skin barrier).  Patient taking differently: Apply 1 Application topically 2 (two) times daily as needed (skin barrier).   Etter Hermann., MD Past Week Active Child, Pharmacy Records            Recommendation:   PCP Follow-up  Follow Up Plan:   Telephone follow-up 09/07/23 @ 215 pm  Cecilie Coffee [facility], BSN RN Care Manager/ Transition of Care [place]/ [facility] [phone]

## 2023-09-01 DIAGNOSIS — J123 Human metapneumovirus pneumonia: Secondary | ICD-10-CM | POA: Diagnosis not present

## 2023-09-01 DIAGNOSIS — N1832 Chronic kidney disease, stage 3b: Secondary | ICD-10-CM | POA: Diagnosis not present

## 2023-09-01 DIAGNOSIS — J690 Pneumonitis due to inhalation of food and vomit: Secondary | ICD-10-CM | POA: Diagnosis not present

## 2023-09-01 DIAGNOSIS — D631 Anemia in chronic kidney disease: Secondary | ICD-10-CM | POA: Diagnosis not present

## 2023-09-01 DIAGNOSIS — I5033 Acute on chronic diastolic (congestive) heart failure: Secondary | ICD-10-CM | POA: Diagnosis not present

## 2023-09-01 DIAGNOSIS — D630 Anemia in neoplastic disease: Secondary | ICD-10-CM | POA: Diagnosis not present

## 2023-09-01 DIAGNOSIS — C3490 Malignant neoplasm of unspecified part of unspecified bronchus or lung: Secondary | ICD-10-CM | POA: Diagnosis not present

## 2023-09-01 DIAGNOSIS — J440 Chronic obstructive pulmonary disease with acute lower respiratory infection: Secondary | ICD-10-CM | POA: Diagnosis not present

## 2023-09-01 DIAGNOSIS — I13 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 failure and stage 1 through stage 4 chronic kidney disease, or unspecified chronic kidney disease: Secondary | ICD-10-CM | POA: Diagnosis not present

## 2023-09-04 DIAGNOSIS — D630 Anemia in neoplastic disease: Secondary | ICD-10-CM | POA: Diagnosis not present

## 2023-09-04 DIAGNOSIS — J690 Pneumonitis due to inhalation of food and vomit: Secondary | ICD-10-CM | POA: Diagnosis not present

## 2023-09-04 DIAGNOSIS — J123 Human metapneumovirus pneumonia: Secondary | ICD-10-CM | POA: Diagnosis not present

## 2023-09-04 DIAGNOSIS — D631 Anemia in chronic kidney disease: Secondary | ICD-10-CM | POA: Diagnosis not present

## 2023-09-04 DIAGNOSIS — I5033 Acute on chronic diastolic (congestive) heart failure: Secondary | ICD-10-CM | POA: Diagnosis not present

## 2023-09-04 DIAGNOSIS — C3490 Malignant neoplasm of unspecified part of unspecified bronchus or lung: Secondary | ICD-10-CM | POA: Diagnosis not present

## 2023-09-04 DIAGNOSIS — I13 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 failure and stage 1 through stage 4 chronic kidney disease, or unspecified chronic kidney disease: Secondary | ICD-10-CM | POA: Diagnosis not present

## 2023-09-04 DIAGNOSIS — N1832 Chronic kidney disease, stage 3b: Secondary | ICD-10-CM | POA: Diagnosis not present

## 2023-09-04 DIAGNOSIS — J440 Chronic obstructive pulmonary disease with acute lower respiratory infection: Secondary | ICD-10-CM | POA: Diagnosis not present

## 2023-09-05 ENCOUNTER — Telehealth: Payer: Self-pay

## 2023-09-05 DIAGNOSIS — J690 Pneumonitis due to inhalation of food and vomit: Secondary | ICD-10-CM | POA: Diagnosis not present

## 2023-09-05 DIAGNOSIS — J123 Human metapneumovirus pneumonia: Secondary | ICD-10-CM | POA: Diagnosis not present

## 2023-09-05 DIAGNOSIS — I13 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 failure and stage 1 through stage 4 chronic kidney disease, or unspecified chronic kidney disease: Secondary | ICD-10-CM | POA: Diagnosis not present

## 2023-09-05 DIAGNOSIS — N1832 Chronic kidney disease, stage 3b: Secondary | ICD-10-CM | POA: Diagnosis not present

## 2023-09-05 DIAGNOSIS — I5033 Acute on chronic diastolic (congestive) heart failure: Secondary | ICD-10-CM | POA: Diagnosis not present

## 2023-09-05 DIAGNOSIS — D631 Anemia in chronic kidney disease: Secondary | ICD-10-CM | POA: Diagnosis not present

## 2023-09-05 DIAGNOSIS — D630 Anemia in neoplastic disease: Secondary | ICD-10-CM | POA: Diagnosis not present

## 2023-09-05 DIAGNOSIS — C3490 Malignant neoplasm of unspecified part of unspecified bronchus or lung: Secondary | ICD-10-CM | POA: Diagnosis not present

## 2023-09-05 DIAGNOSIS — J440 Chronic obstructive pulmonary disease with acute lower respiratory infection: Secondary | ICD-10-CM | POA: Diagnosis not present

## 2023-09-07 ENCOUNTER — Other Ambulatory Visit: Payer: Self-pay | Admitting: *Deleted

## 2023-09-07 DIAGNOSIS — D630 Anemia in neoplastic disease: Secondary | ICD-10-CM | POA: Diagnosis not present

## 2023-09-07 DIAGNOSIS — J123 Human metapneumovirus pneumonia: Secondary | ICD-10-CM | POA: Diagnosis not present

## 2023-09-07 DIAGNOSIS — I13 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 failure and stage 1 through stage 4 chronic kidney disease, or unspecified chronic kidney disease: Secondary | ICD-10-CM | POA: Diagnosis not present

## 2023-09-07 DIAGNOSIS — J690 Pneumonitis due to inhalation of food and vomit: Secondary | ICD-10-CM | POA: Diagnosis not present

## 2023-09-07 DIAGNOSIS — J440 Chronic obstructive pulmonary disease with acute lower respiratory infection: Secondary | ICD-10-CM | POA: Diagnosis not present

## 2023-09-07 DIAGNOSIS — N1832 Chronic kidney disease, stage 3b: Secondary | ICD-10-CM | POA: Diagnosis not present

## 2023-09-07 DIAGNOSIS — D631 Anemia in chronic kidney disease: Secondary | ICD-10-CM | POA: Diagnosis not present

## 2023-09-07 DIAGNOSIS — C3490 Malignant neoplasm of unspecified part of unspecified bronchus or lung: Secondary | ICD-10-CM | POA: Diagnosis not present

## 2023-09-07 DIAGNOSIS — I5033 Acute on chronic diastolic (congestive) heart failure: Secondary | ICD-10-CM | POA: Diagnosis not present

## 2023-09-07 NOTE — Patient Outreach (Signed)
 Transition of Care week 3  Visit Note  09/07/2023  Name: Evan [name] MRN: [id number]          DOB: 09-Apr-1927  Situation: Patient enrolled in [facility] 30-day program. Visit completed with patient's daughter by telephone.   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   Altered mental status 08/21/2018   Anginal chest pain at rest [facility])    Chronic non, controlled on Lorazepam    Anxiety    Arthritis    "all over"    BPH (benign prostatic hyperplasia)    CAD (coronary artery disease)    Cellulitis of arm, right 06/01/2018   Cellulitis of left axilla    Chronic lower back pain    Complication of anesthesia    "problems making water afterwards"   Depression    DJD (degenerative joint disease)    Esophageal ulcer without bleeding    bid ppi indefinitely   Fall 07/16/2018   Family history of adverse reaction to anesthesia    "children w/PONV"   GERD (gastroesophageal reflux disease)    Haemophilus influenzae septicemia (HCC) 03/12/2023   Headache    "couple/week maybe" (09/04/2015)   Hemochromatosis    Possible, elevated iron  stores, hook-like osteophytes on hand films, normal LFTs   Hepatitis    "yellow jaundice as a baby"   History of ASCVD    MULTIVESSEL   Hyperlipidemia    Hypertension    Hypotension    Mild aortic stenosis 06/23/2021   Osteoarthritis    Peripheral vascular disease (HCC)    Pneumonia    "several times; got a little now" (09/04/2015)   Pulmonary embolism (HCC) 02/02/2018   Rash and nonspecific skin eruption 06/01/2018   Rheumatoid arthritis (HCC)    RSV (respiratory syncytial virus pneumonia) 05/15/2022   Subdural hematoma (HCC)    small after fall 09/2016-plavix  held, neurosurgery consulted.  Asymptomatic.     Thromboembolism  (HCC) 02/2018   on Lovenox  lifelong since he failed PO Eliquis     Assessment: Patient Reported Symptoms: Cognitive Cognitive Status: Difficulties with attention and concentration, Requires Assistance Decision Making, Struggling with memory recall      Neurological Neurological Review of Symptoms: Hearing changes Neurological Self-Management Outcome: 3 (uncertain) Neurological Comment: wears hearing aides  HEENT HEENT Symptoms Reported: Other: Other HEENT Symptoms/Conditions: bil hard of hearing HEENT Self-Management Outcome: 3 (uncertain) HEENT Comment: bil hearing aides    Cardiovascular Cardiovascular Symptoms Reported: No symptoms reported    Respiratory Respiratory Symptoms Reported: No symptoms reported Other Respiratory Symptoms: daughter reports no dyspnea    Endocrine Patient reports the following symptoms related to hypoglycemia or hyperglycemia : No symptoms reported Is patient diabetic?: No    Gastrointestinal Gastrointestinal Symptoms Reported: No symptoms reported      Genitourinary Genitourinary Symptoms Reported: Incontinence Additional Genitourinary Details: foley catheter patent, draining well Genitourinary Conditions: Chronic kidney disease Genitourinary Management Strategies: Adequate rest, Medication therapy Genitourinary Self-Management Outcome: 3 (uncertain)  Integumentary Integumentary Symptoms Reported: No symptoms reported    Musculoskeletal Musculoskelatal Symptoms Reviewed: Difficulty walking Additional Musculoskeletal Details: walker Musculoskeletal Management Strategies: Routine screening, Adequate rest Musculoskeletal Self-Management Outcome: 3 (uncertain)      Psychosocial Psychosocial Symptoms Reported: No symptoms reported         There were no vitals filed for this visit.  Medications Reviewed Today     Reviewed by Daralyn Earl, RN (Registered Nurse) on 09/07/23 at 1443  Med List Status: <None>   Medication Order Taking? Sig  Documenting Provider Last Dose Status Informant  acetaminophen  (TYLENOL ) 500 MG tablet [id number] No Take 500 mg by mouth 2 (two) times daily as needed for mild pain, moderate pain, fever or headache. [provider] 08/23/2023 Active Child, Pharmacy Records  apixaban  (ELIQUIS ) 2.5 MG TABS tablet [id number] No Take 1 tablet (2.5 mg total) by mouth 2 (two) times daily. Gerald Kitty., NP 08/23/2023 Active Child, Pharmacy Records  budesonide -formoterol  (SYMBICORT ) 160-4.5 MCG/ACT inhaler [id number] No Inhale 2 puffs into the lungs in the morning and at bedtime. Austine Lefort, MD 08/23/2023 Active Child, Pharmacy Records  ciprofloxacin  (CILOXAN ) 0.3 % ophthalmic solution [id number] No Place 2 drops into both eyes every 4 (four) hours while awake. Administer 1 drop, every 2 hours, while awake, for 2 days. Then 1 drop, every 4 hours, while awake, for the next 5 days.  Patient taking differently: Place 2 drops into both eyes every 4 (four) hours while awake. Patient is taking as needed.   Unk Garb, DO Past Week Active Child, Pharmacy Records  ferrous sulfate  [facility]) 325 (65 FE) MG tablet [id number] No TAKE 1 TABLET EVERY DAY Austine Lefort, MD 08/23/2023 Active Child, Pharmacy Records  FLUoxetine  (PROZAC ) 20 MG capsule [id number] No TAKE 1 CAPSULE EVERY DAY Austine Lefort, MD 08/23/2023 Active Child, Pharmacy Records  folic acid  (FOLVITE ) 1 MG tablet [id number] No Take 1 tablet (1 mg total) by mouth daily. [name], Curlee Doss, MD 08/23/2023 Active Child, Pharmacy Records  furosemide  (LASIX ) 40 MG tablet [id number] No TAKE 1 TABLET EVERY DAY Austine Lefort, MD 08/23/2023 Active Child, Pharmacy Records  hydrOXYzine  (ATARAX ) 10 MG/5ML syrup [id number] No Take 5 mLs (10 mg total) by mouth every 8 (eight) hours as needed for itching. [name], Curlee Doss, MD 08/22/2023 Active Child, Pharmacy Records  melatonin 3 MG TABS tablet [id number] No Take 1 tablet (3 mg total) by mouth at bedtime as needed  (insomnia). [name], Curlee Doss, MD 08/22/2023 Active Child, Pharmacy Records  metoprolol  succinate (TOPROL -XL) 25 MG 24 hr tablet [id number] No Take 12.5 mg by mouth daily. [provider] 08/23/2023 Active Child, Pharmacy Records  Multiple Vitamins-Minerals (MENS 50+ MULTIVITAMIN) TABS [id number] No Take 1 tablet by mouth daily. [provider] 08/23/2023 Active Child, Pharmacy Records  mupirocin  ointment (BACTROBAN ) 2 % [id number] No Apply 1 Application topically 2 (two) times daily. Hyatt, [name], DPM Unknown Active Child, Pharmacy Records  nitroGLYCERIN  (NITROSTAT ) 0.4 MG SL tablet [id number] No Place 1 tablet (0.4 mg total) under the tongue every 5 (five) minutes as needed for chest pain. Austine Lefort, MD Taking Active Child, Pharmacy Records           Med Note Cornelia Dieter, SEBASTIAN   Wed Aug 23, 2023 10:22 PM) PRN, no recent need/use  nystatin  (MYCOSTATIN /NYSTOP ) powder [id number] No Apply a thin layer topically to the affected area 2-3 (two to three) times daily.  08/22/2023 Active Child, Pharmacy Records  pantoprazole  (PROTONIX ) 40 MG tablet [id number] No TAKE 1 TABLET EVERY MORNING Austine Lefort, MD 08/23/2023 Active Child, Pharmacy Records  predniSONE  (DELTASONE ) 20 MG tablet [id number]  3 tabs poqday 1-2, 2 tabs poqday 3-4, 1 tab poqday 5-6 Austine Lefort, MD  Active   rosuvastatin  (CRESTOR ) 5 MG tablet [id number] No Take 1 tablet (5 mg total) by mouth at bedtime. [name], Mauricio  Bearl Limes, MD 08/22/2023 Active Child, Pharmacy Records  senna-docusate (STIMULANT LAXATIVE) 8.6-50 MG tablet [id number] No Take 2 tablets by mouth 2 (two) times daily as needed for constipation  Past Week Active Child, Pharmacy Records  tamsulosin  (FLOMAX ) 0.4 MG CAPS capsule [id number] No Take 1 capsule (0.4 mg total) by mouth daily with supper.  Patient taking differently: Take 0.4 mg by mouth at bedtime.   Austine Lefort, MD 08/22/2023 Active Child, Pharmacy Records  Zinc  Oxide (TRIPLE PASTE) 12.8  % ointment [id number] No Apply topically 4 (four) times daily as needed (skin barrier).  Patient taking differently: Apply 1 Application topically 2 (two) times daily as needed (skin barrier).   Etter Hermann., MD Past Week Active Child, Pharmacy Records            Recommendation:   PCP Follow-up  Follow Up Plan:   Telephone follow-up 09/15/23 @ 215 pm  Cecilie Coffee [facility], BSN RN Care Manager/ Transition of Care [place]/ [facility] [phone]

## 2023-09-07 NOTE — Patient Instructions (Signed)
 Visit Information  Thank you for taking time to visit with me today. Please don't hesitate to contact me if I can be of assistance to you before our next scheduled telephone appointment.  Our next appointment is by telephone on 09/15/23 at 215 pm  Following is a copy of your care plan:   Goals Addressed             This Visit's Progress    VBCI Transitions of Care (TOC) Care Plan       Problems:  Recent Hospitalization for treatment of CHF Home Health services barrier: [facility] has not contacted patient and No Specialist appointment daughter states she is going to talk with primary care provider about choosing a cardiologist Per daughter, pt is not able to weigh, too unsteady, uses walker, lives alone, daughter checks in and sees pt daily, 2 sons spend the night, adult children provide all oversight for care Patient has primary care provider appointment 6/6, there is earlier appointment per collaboration with care guide for 5/27 with NP and daughter does not want to see another provider and declined. Daughter reports no issues with foley catheter, pt uses thick it for liquids and has been doing this for quite some time, pt was on hospice services and discharged recently per daughter (after 6 months), pt has slight diarrhea per daughter and states " but looks like it's getting better, will take something if anything gets worse" 08/31/23- pt had hospital observation 5/21 for cellulitis to right hand, daughter states " looks really good now",  no redness, no drainage, no other new concerns reported, no issues with foley cathter, home health continues 09/07/23- daughter states cellulitis resolved, no concerns reported, states patient's appetite is better, continues to drink Ensure as needed, drinking adequate fluids  Goal:  Over the next 30 days, the patient will not experience hospital readmission  Interventions:   Heart Failure Interventions: Basic overview and discussion of  pathophysiology of Heart Failure reviewed Provided education on low sodium diet Discussed importance of daily weight and advised patient to weigh and record daily Discussed the importance of keeping all appointments with provider Reinforced signs /symptoms of infection, pneumonia Verified [facility] home [facility] is working with pt Reinforced importance of good nutrition Reinforced safety precautions Reviewed heart failure action plan Reviewed energy conservation  Patient Self Care Activities:  Follow Heart Failure action plan, call your doctor early on for change in health status / symptoms (weight gain, swelling in feet, increased shortness of breath, overall not feeling well) Call 911 for unrelieved shortness of breath, chest pain, fainting Be mindful of sodium intake, avoid fast food, salty snacks If you have any signs/ symptoms of infection, let your doctor know immediately Continue working with [facility]- complete prescribed exercises   Plan:  Telephone follow up appointment with care management team member scheduled for:  09/15/23 @ 215 pm The patient has been provided with contact information for the care management team and has been advised to call with any health related questions or concerns.         Patient verbalizes understanding of instructions and care plan provided today and agrees to view in MyChart. Active MyChart status and patient understanding of how to access instructions and care plan via MyChart confirmed with patient.     Telephone follow up appointment with care management team member scheduled for:  09/15/23 @ 215 pm  Please call the care guide team at [phone] if you need to cancel or reschedule  your appointment.   Please call the Suicide and Crisis Lifeline: 988 call the [place]  National Suicide Prevention Lifeline: (762)608-2897 or TTY: 5316821270 TTY (220)629-4195) to talk to a trained counselor call 1-800-273-TALK (toll free, 24 hour  hotline) go to [facility] [address] [place] [phone]) call the [facility] Crisis Line: [phone] call 911 if you are experiencing a Mental Health or Behavioral Health Crisis or need someone to talk to.  Cecilie Coffee [facility], BSN RN Care Manager/ Transition of Care [place]/ [facility] [phone]

## 2023-09-08 ENCOUNTER — Encounter: Payer: Self-pay | Admitting: Family Medicine

## 2023-09-08 ENCOUNTER — Ambulatory Visit: Admitting: Family Medicine

## 2023-09-08 VITALS — BP 114/52 | HR 74 | Temp 98.4°F | Ht 62.0 in | Wt 129.0 lb

## 2023-09-08 DIAGNOSIS — R531 Weakness: Secondary | ICD-10-CM

## 2023-09-08 DIAGNOSIS — Z85118 Personal history of other malignant neoplasm of bronchus and lung: Secondary | ICD-10-CM

## 2023-09-08 MED ORDER — PREDNISONE 20 MG PO TABS: ORAL_TABLET | ORAL | 0 refills | Status: DC

## 2023-09-08 NOTE — Progress Notes (Signed)
 Subjective:  08/25/23 I recently saw the patient for shortness of breath and diagnosed him with bilateral pneumonia.  Patient was started on Augmentin  and azithromycin .  However his condition deteriorated further and he went to the emergency room.  He was admitted to the hospital with pneumonia and respiratory failure.  Shortly after discharge from the hospital, the patient returned with erythema and pain in his right wrist.  Ultrasound was performed to rule out DVT.  X-rays were unremarkable.  Patient with treated for possible cellulitis with Keflex .  The erythema has resolved in his right wrist.  He has no additional pain.  Of note his creatinine at discharge 1.19.  He appears a little bit fluid overloaded today.  His traditional creatinine is around 1.3.  He does have bilateral crackles in both lungs however states that he has been coughing more when he lays down at night.  He denies any chest pain or shortness of breath.  At that time, my plan was: There is no evidence of any residual cellulitis around the right wrist.  I believe the patient may have had a gout exacerbation based on his previous history.  Thankfully he is growing much better now.  Recommended he complete the Keflex  I did ask his daughter to give him an extra dose of Lasix  tonight as he appears slightly fluid overloaded.  Reassess next week if no better or sooner if worse.  The pneumonia seems to have resolved completely and the patient is back to his baseline.  09/08/23 Since I last saw the patient, he had another gout exacerbation.  This time it occurred in his left wrist.  His daughter gave him the prednisone  and the redness and swelling subsided quickly.  We discussed starting allopurinol however she wants to avoid this today.  On exam, the patient has rhonchi and Rales in his right lung throughout.  Left lung sounds clear to the.  There is no pitting edema in his extremities.  There is no evidence of fluid overload. Past Medical  History:  Diagnosis Date   Acute blood loss anemia    Acute bronchitis 05/15/2017   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 07/14/2018   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   Altered mental status 08/21/2018   Anginal chest pain at rest [facility])    Chronic non, controlled on Lorazepam    Anxiety    Arthritis    "all over"    BPH (benign prostatic hyperplasia)    CAD (coronary artery disease)    Cellulitis of arm, right 06/01/2018   Cellulitis of left axilla    Chronic lower back pain    Complication of anesthesia    "problems making water afterwards"   Depression    DJD (degenerative joint disease)    Esophageal ulcer without bleeding    bid ppi indefinitely   Fall 07/16/2018   Family history of adverse reaction to anesthesia    "children w/PONV"   GERD (gastroesophageal reflux disease)    Haemophilus influenzae septicemia (HCC) 03/12/2023   Headache    "couple/week maybe" (09/04/2015)   Hemochromatosis    Possible, elevated iron  stores, hook-like osteophytes on hand films, normal LFTs   Hepatitis    "yellow jaundice as a baby"   History of ASCVD    MULTIVESSEL   Hyperlipidemia    Hypertension    Hypotension    Mild aortic stenosis 06/23/2021   Osteoarthritis    Peripheral vascular disease (HCC)    Pneumonia    "  several times; got a little now" (09/04/2015)   Pulmonary embolism (HCC) 02/02/2018   Rash and nonspecific skin eruption 06/01/2018   Rheumatoid arthritis (HCC)    RSV (respiratory syncytial virus pneumonia) 05/15/2022   Subdural hematoma (HCC)    small after fall 09/2016-plavix  held, neurosurgery consulted.  Asymptomatic.     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 PO Eliquis    Past Surgical History:  Procedure Laterality Date   ARM SKIN LESION BIOPSY / EXCISION Left 09/02/2015   CATARACT EXTRACTION W/ INTRAOCULAR LENS  IMPLANT, BILATERAL Bilateral     CHOLECYSTECTOMY OPEN     CORNEAL TRANSPLANT Bilateral    "one at [facility]; one at [facility]"   CORONARY ANGIOPLASTY WITH STENT PLACEMENT     CORONARY ARTERY BYPASS GRAFT  1999   DESCENDING AORTIC ANEURYSM REPAIR W/ STENT     DE stent ostium into the right radial free graft at OM-, 02-2007   ESOPHAGOGASTRODUODENOSCOPY N/A 11/09/2014   Procedure: ESOPHAGOGASTRODUODENOSCOPY (EGD);  Surgeon: Delilah Fend, MD;  Location: [facility] ENDOSCOPY;  Service: Endoscopy;  Laterality: N/A;   INGUINAL HERNIA REPAIR     JOINT REPLACEMENT     LEFT HEART CATH AND CORS/GRAFTS ANGIOGRAPHY N/A 06/27/2017   Procedure: LEFT HEART CATH AND CORS/GRAFTS ANGIOGRAPHY;  Surgeon: Millicent Ally, MD;  Location: [facility] INVASIVE CV LAB;  Service: Cardiovascular;  Laterality: N/A;   NASAL SINUS SURGERY     SHOULDER OPEN ROTATOR CUFF REPAIR Right    TOTAL KNEE ARTHROPLASTY Bilateral    Current Outpatient Medications on File Prior to Visit  Medication Sig Dispense Refill   acetaminophen  (TYLENOL ) 500 MG tablet Take 500 mg by mouth 2 (two) times daily as needed for mild pain, moderate pain, fever or headache.     apixaban  (ELIQUIS ) 2.5 MG TABS tablet Take 1 tablet (2.5 mg total) by mouth 2 (two) times daily. 180 tablet 3   budesonide -formoterol  (SYMBICORT ) 160-4.5 MCG/ACT inhaler Inhale 2 puffs into the lungs in the morning and at bedtime. 10.2 g 12   ciprofloxacin  (CILOXAN ) 0.3 % ophthalmic solution Place 2 drops into both eyes every 4 (four) hours while awake. Administer 1 drop, every 2 hours, while awake, for 2 days. Then 1 drop, every 4 hours, while awake, for the next 5 days. (Patient taking differently: Place 2 drops into both eyes every 4 (four) hours while awake. Patient is taking as needed.) 5 mL 0   ferrous sulfate  (FEROSUL) 325 (65 FE) MG tablet TAKE 1 TABLET EVERY DAY 90 tablet 0   FLUoxetine  (PROZAC ) 20 MG capsule TAKE 1 CAPSULE EVERY DAY 90 capsule 3   folic acid  (FOLVITE ) 1 MG tablet Take 1 tablet (1 mg total) by mouth daily. 30 tablet 0    furosemide  (LASIX ) 40 MG tablet TAKE 1 TABLET EVERY DAY 90 tablet 3   hydrOXYzine  (ATARAX ) 10 MG/5ML syrup Take 5 mLs (10 mg total) by mouth every 8 (eight) hours as needed for itching. 30 mL 0   melatonin 3 MG TABS tablet Take 1 tablet (3 mg total) by mouth at bedtime as needed (insomnia). 30 tablet 0   metoprolol  succinate (TOPROL -XL) 25 MG 24 hr tablet Take 12.5 mg by mouth daily.     Multiple Vitamins-Minerals (MENS 50+ MULTIVITAMIN) TABS Take 1 tablet by mouth daily.     mupirocin  ointment (BACTROBAN ) 2 % Apply 1 Application topically 2 (two) times daily. 22 g 0   nitroGLYCERIN  (NITROSTAT ) 0.4 MG SL tablet Place 1 tablet (0.4 mg total) under  the tongue every 5 (five) minutes as needed for chest pain. 50 tablet 3   nystatin  (MYCOSTATIN /NYSTOP ) powder Apply a thin layer topically to the affected area 2-3 (two to three) times daily. 15 g 0   pantoprazole  (PROTONIX ) 40 MG tablet TAKE 1 TABLET EVERY MORNING 90 tablet 0   predniSONE  (DELTASONE ) 20 MG tablet 3 tabs poqday 1-2, 2 tabs poqday 3-4, 1 tab poqday 5-6 12 tablet 0   rosuvastatin  (CRESTOR ) 5 MG tablet Take 1 tablet (5 mg total) by mouth at bedtime.     senna-docusate (STIMULANT LAXATIVE) 8.6-50 MG tablet Take 2 tablets by mouth 2 (two) times daily as needed for constipation 60 tablet 2   tamsulosin  (FLOMAX ) 0.4 MG CAPS capsule Take 1 capsule (0.4 mg total) by mouth daily with supper. (Patient taking differently: Take 0.4 mg by mouth at bedtime.) 90 capsule 3   Zinc  Oxide (TRIPLE PASTE) 12.8 % ointment Apply topically 4 (four) times daily as needed (skin barrier). (Patient taking differently: Apply 1 Application topically 2 (two) times daily as needed (skin barrier).) 56.7 g 0   No current facility-administered medications on file prior to visit.   Allergies  Allergen Reactions   Feldene [Piroxicam] Rash    Blistering rash     Neomycin -Polymyxin-Hc Itching   Statins Other (See Comments)    Myalgias   Latex Rash    Band Aid    Tequin [Gatifloxacin] Swelling and Anxiety   Valium [Diazepam] Other (See Comments)    Dizziness    Vibramycin  [Doxycycline ] Itching and Nausea Only   Social History   Socioeconomic History   Marital status: Widowed    Spouse name: Not on file   Number of children: 2   Years of education: 25   Highest education level: 12th grade  Occupational History   Not on file  Tobacco Use   Smoking status: Former    Types: Cigarettes    Passive exposure: Yes   Smokeless tobacco: Former    Types: Chew   Tobacco comments:    "quit chewing in the 1960s"    Verified by Daughter, Zula Hitch  Vaping Use   Vaping status: Never Used  Substance and Sexual Activity   Alcohol  use: No   Drug use: No   Sexual activity: Never  Other Topics Concern   Not on file  Social History Narrative   Not on file   Social Drivers of Health   Financial Resource Strain: Low Risk  (04/20/2023)   Overall Financial Resource Strain (CARDIA)    Difficulty of Paying Living Expenses: Not hard at all  Food Insecurity: No Food Insecurity (08/24/2023)   Hunger Vital Sign    Worried About Running Out of Food in the Last Year: Never true    Ran Out of Food in the Last Year: Never true  Transportation Needs: No Transportation Needs (08/24/2023)   PRAPARE - Administrator, Civil Service (Medical): No    Lack of Transportation (Non-Medical): No  Physical Activity: Insufficiently Active (04/20/2023)   Exercise Vital Sign    Days of Exercise per Week: 5 days    Minutes of Exercise per Session: 10 min  Stress: No Stress Concern Present (04/20/2023)   [employer] of Occupational Health - Occupational Stress Questionnaire    Feeling of Stress : Not at all  Social Connections: Socially Isolated (08/24/2023)   Social Connection and Isolation Panel [NHANES]    Frequency of Communication with Friends and Family: Twice a week  Frequency of Social Gatherings with Friends and Family: Twice a week     Attends Religious Services: Never    Database administrator or Organizations: No    Attends Banker Meetings: Never    Marital Status: Widowed  Intimate Partner Violence: Not At Risk (08/24/2023)   Humiliation, Afraid, Rape, and Kick questionnaire    Fear of Current or Ex-Partner: No    Emotionally Abused: No    Physically Abused: No    Sexually Abused: No      Review of Systems     Objective:   Physical Exam Vitals reviewed.  Constitutional:      General: He is not in acute distress.    Appearance: Normal appearance. He is not toxic-appearing or diaphoretic.  Cardiovascular:     Rate and Rhythm: Normal rate and regular rhythm.     Pulses:          Dorsalis pedis pulses are 1+ on the right side and 1+ on the left side.     Heart sounds: Normal heart sounds. No murmur heard.    No friction rub. No gallop.  Pulmonary:     Effort: Pulmonary effort is normal. No respiratory distress.     Breath sounds: Examination of the right-middle field reveals rhonchi and rales. Examination of the right-lower field reveals rhonchi and rales. Rhonchi and rales present. No wheezing.    Musculoskeletal:     Right lower leg: No edema.     Left lower leg: No edema.  Neurological:     Mental Status: He is alert.   Right-sided hemiparesis with weakness in his right leg.  Weakness with knee flexion and extension and ankle flexion and extension.  Ataxia in the right upper arm and right lower leg.       Assessment & Plan:  Weakness generalized  History of lung cancer Patient appears extremely weak and frail.  He has chronic opacification of right lung due to previous treated lung cancer as well as scarring from radiation therapy.  I believe the patient has poor "pulmonary toilet" due to weakness and insufficient effort.  This likely exacerbates his breath sounds due to atelectasis.  Recommended incentive spirometry and flutter valve 3 times daily to improve pulmonary toilet.   Patient's prognosis is very guarded.  If the patient develops fever or worsening cough, I would have very low threshold to restart antibiotics.  He is at high risk for recurrent pneumonia.  No evidence of overload on today's exam.

## 2023-09-09 ENCOUNTER — Encounter (HOSPITAL_COMMUNITY): Payer: Self-pay

## 2023-09-09 ENCOUNTER — Emergency Department (HOSPITAL_COMMUNITY)

## 2023-09-09 ENCOUNTER — Inpatient Hospital Stay (HOSPITAL_COMMUNITY)
Admission: EM | Admit: 2023-09-09 | Discharge: 2023-09-15 | DRG: 871 | Disposition: A | Discharge status: Home Health | Attending: Family Medicine | Admitting: Family Medicine

## 2023-09-09 ENCOUNTER — Other Ambulatory Visit: Payer: Self-pay

## 2023-09-09 DIAGNOSIS — Z9842 Cataract extraction status, left eye: Secondary | ICD-10-CM

## 2023-09-09 DIAGNOSIS — F0393 Unspecified dementia, unspecified severity, with mood disturbance: Secondary | ICD-10-CM | POA: Diagnosis present

## 2023-09-09 DIAGNOSIS — Z951 Presence of aortocoronary bypass graft: Secondary | ICD-10-CM

## 2023-09-09 DIAGNOSIS — N400 Enlarged prostate without lower urinary tract symptoms: Secondary | ICD-10-CM | POA: Diagnosis present

## 2023-09-09 DIAGNOSIS — I2489 Other forms of acute ischemic heart disease: Secondary | ICD-10-CM | POA: Diagnosis present

## 2023-09-09 DIAGNOSIS — N390 Urinary tract infection, site not specified: Secondary | ICD-10-CM | POA: Diagnosis present

## 2023-09-09 DIAGNOSIS — F0390 Unspecified dementia without behavioral disturbance: Secondary | ICD-10-CM | POA: Diagnosis present

## 2023-09-09 DIAGNOSIS — K219 Gastro-esophageal reflux disease without esophagitis: Secondary | ICD-10-CM | POA: Diagnosis present

## 2023-09-09 DIAGNOSIS — D631 Anemia in chronic kidney disease: Secondary | ICD-10-CM | POA: Diagnosis present

## 2023-09-09 DIAGNOSIS — J811 Chronic pulmonary edema: Secondary | ICD-10-CM | POA: Diagnosis not present

## 2023-09-09 DIAGNOSIS — D696 Thrombocytopenia, unspecified: Secondary | ICD-10-CM | POA: Diagnosis present

## 2023-09-09 DIAGNOSIS — H9190 Unspecified hearing loss, unspecified ear: Secondary | ICD-10-CM | POA: Diagnosis present

## 2023-09-09 DIAGNOSIS — E876 Hypokalemia: Principal | ICD-10-CM | POA: Diagnosis present

## 2023-09-09 DIAGNOSIS — Z91040 Latex allergy status: Secondary | ICD-10-CM

## 2023-09-09 DIAGNOSIS — Z888 Allergy status to other drugs, medicaments and biological substances status: Secondary | ICD-10-CM

## 2023-09-09 DIAGNOSIS — R0602 Shortness of breath: Secondary | ICD-10-CM | POA: Diagnosis not present

## 2023-09-09 DIAGNOSIS — I700 Atherosclerosis of aorta: Secondary | ICD-10-CM | POA: Diagnosis not present

## 2023-09-09 DIAGNOSIS — Z8719 Personal history of other diseases of the digestive system: Secondary | ICD-10-CM

## 2023-09-09 DIAGNOSIS — A419 Sepsis, unspecified organism: Secondary | ICD-10-CM | POA: Diagnosis present

## 2023-09-09 DIAGNOSIS — Z8249 Family history of ischemic heart disease and other diseases of the circulatory system: Secondary | ICD-10-CM

## 2023-09-09 DIAGNOSIS — Z947 Corneal transplant status: Secondary | ICD-10-CM

## 2023-09-09 DIAGNOSIS — E871 Hypo-osmolality and hyponatremia: Secondary | ICD-10-CM | POA: Diagnosis present

## 2023-09-09 DIAGNOSIS — Z886 Allergy status to analgesic agent status: Secondary | ICD-10-CM

## 2023-09-09 DIAGNOSIS — R6520 Severe sepsis without septic shock: Secondary | ICD-10-CM | POA: Diagnosis not present

## 2023-09-09 DIAGNOSIS — J90 Pleural effusion, not elsewhere classified: Secondary | ICD-10-CM | POA: Diagnosis not present

## 2023-09-09 DIAGNOSIS — N1831 Chronic kidney disease, stage 3a: Secondary | ICD-10-CM | POA: Diagnosis present

## 2023-09-09 DIAGNOSIS — J189 Pneumonia, unspecified organism: Secondary | ICD-10-CM | POA: Diagnosis present

## 2023-09-09 DIAGNOSIS — J440 Chronic obstructive pulmonary disease with acute lower respiratory infection: Secondary | ICD-10-CM | POA: Diagnosis present

## 2023-09-09 DIAGNOSIS — Z7951 Long term (current) use of inhaled steroids: Secondary | ICD-10-CM

## 2023-09-09 DIAGNOSIS — Z1152 Encounter for screening for COVID-19: Secondary | ICD-10-CM

## 2023-09-09 DIAGNOSIS — B965 Pseudomonas (aeruginosa) (mallei) (pseudomallei) as the cause of diseases classified elsewhere: Secondary | ICD-10-CM | POA: Diagnosis present

## 2023-09-09 DIAGNOSIS — Z8673 Personal history of transient ischemic attack (TIA), and cerebral infarction without residual deficits: Secondary | ICD-10-CM

## 2023-09-09 DIAGNOSIS — I4821 Permanent atrial fibrillation: Secondary | ICD-10-CM | POA: Diagnosis present

## 2023-09-09 DIAGNOSIS — I5033 Acute on chronic diastolic (congestive) heart failure: Secondary | ICD-10-CM | POA: Diagnosis present

## 2023-09-09 DIAGNOSIS — J690 Pneumonitis due to inhalation of food and vomit: Secondary | ICD-10-CM | POA: Diagnosis not present

## 2023-09-09 DIAGNOSIS — Z881 Allergy status to other antibiotic agents status: Secondary | ICD-10-CM

## 2023-09-09 DIAGNOSIS — I10 Essential (primary) hypertension: Secondary | ICD-10-CM | POA: Diagnosis present

## 2023-09-09 DIAGNOSIS — J449 Chronic obstructive pulmonary disease, unspecified: Secondary | ICD-10-CM | POA: Diagnosis present

## 2023-09-09 DIAGNOSIS — J9601 Acute respiratory failure with hypoxia: Secondary | ICD-10-CM | POA: Diagnosis present

## 2023-09-09 DIAGNOSIS — I13 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 failure and stage 1 through stage 4 chronic kidney disease, or unspecified chronic kidney disease: Secondary | ICD-10-CM | POA: Diagnosis present

## 2023-09-09 DIAGNOSIS — F32A Depression, unspecified: Secondary | ICD-10-CM | POA: Diagnosis present

## 2023-09-09 DIAGNOSIS — R918 Other nonspecific abnormal finding of lung field: Secondary | ICD-10-CM | POA: Diagnosis not present

## 2023-09-09 DIAGNOSIS — Z66 Do not resuscitate: Secondary | ICD-10-CM | POA: Diagnosis present

## 2023-09-09 DIAGNOSIS — M069 Rheumatoid arthritis, unspecified: Secondary | ICD-10-CM | POA: Diagnosis present

## 2023-09-09 DIAGNOSIS — J181 Lobar pneumonia, unspecified organism: Secondary | ICD-10-CM | POA: Diagnosis not present

## 2023-09-09 DIAGNOSIS — Z96653 Presence of artificial knee joint, bilateral: Secondary | ICD-10-CM | POA: Diagnosis present

## 2023-09-09 DIAGNOSIS — Z883 Allergy status to other anti-infective agents status: Secondary | ICD-10-CM

## 2023-09-09 DIAGNOSIS — Z7901 Long term (current) use of anticoagulants: Secondary | ICD-10-CM

## 2023-09-09 DIAGNOSIS — Z79899 Other long term (current) drug therapy: Secondary | ICD-10-CM

## 2023-09-09 DIAGNOSIS — Z961 Presence of intraocular lens: Secondary | ICD-10-CM | POA: Diagnosis present

## 2023-09-09 DIAGNOSIS — I2510 Atherosclerotic heart disease of native coronary artery without angina pectoris: Secondary | ICD-10-CM | POA: Diagnosis present

## 2023-09-09 DIAGNOSIS — Z86711 Personal history of pulmonary embolism: Secondary | ICD-10-CM

## 2023-09-09 DIAGNOSIS — Z955 Presence of coronary angioplasty implant and graft: Secondary | ICD-10-CM

## 2023-09-09 DIAGNOSIS — Z87891 Personal history of nicotine dependence: Secondary | ICD-10-CM

## 2023-09-09 DIAGNOSIS — Z85118 Personal history of other malignant neoplasm of bronchus and lung: Secondary | ICD-10-CM

## 2023-09-09 DIAGNOSIS — Z8261 Family history of arthritis: Secondary | ICD-10-CM

## 2023-09-09 DIAGNOSIS — Z9841 Cataract extraction status, right eye: Secondary | ICD-10-CM

## 2023-09-09 DIAGNOSIS — E785 Hyperlipidemia, unspecified: Secondary | ICD-10-CM | POA: Diagnosis present

## 2023-09-09 LAB — BASIC METABOLIC PANEL WITH GFR
Anion gap: 12 (ref 5–15)
BUN: 31 mg/dL — ABNORMAL HIGH (ref 8–23)
CO2: 21 mmol/L — ABNORMAL LOW (ref 22–32)
Calcium: 8.6 mg/dL — ABNORMAL LOW (ref 8.9–10.3)
Chloride: 101 mmol/L (ref 98–111)
Creatinine, Ser: 1.1 mg/dL (ref 0.61–1.24)
GFR, Estimated: >60 mL/min (ref >60)
Glucose, Bld: 101 mg/dL — ABNORMAL HIGH (ref 70–99)
Potassium: 2.8 mmol/L — ABNORMAL LOW (ref 3.5–5.1)
Sodium: 134 mmol/L — ABNORMAL LOW (ref 135–145)

## 2023-09-09 LAB — URINALYSIS, ROUTINE W REFLEX MICROSCOPIC
Bilirubin Urine: NEGATIVE
Glucose, UA: NEGATIVE mg/dL
Hgb urine dipstick: NEGATIVE
Ketones, ur: NEGATIVE mg/dL
Nitrite: NEGATIVE
Protein, ur: NEGATIVE mg/dL
Specific Gravity, Urine: 1.017 (ref 1.005–1.030)
pH: 5 (ref 5.0–8.0)

## 2023-09-09 LAB — CBC
HCT: 29.6 % — ABNORMAL LOW (ref 39.0–52.0)
Hemoglobin: 10.1 g/dL — ABNORMAL LOW (ref 13.0–17.0)
MCH: 34.2 pg — ABNORMAL HIGH (ref 26.0–34.0)
MCHC: 34.1 g/dL (ref 30.0–36.0)
MCV: 100.3 fL — ABNORMAL HIGH (ref 80.0–100.0)
Platelets: 82 10*3/uL — ABNORMAL LOW (ref 150–400)
RBC: 2.95 MIL/uL — ABNORMAL LOW (ref 4.22–5.81)
RDW: 14.6 % (ref 11.5–15.5)
WBC: 15.8 10*3/uL — ABNORMAL HIGH (ref 4.0–10.5)
nRBC: 0 % (ref 0.0–0.2)

## 2023-09-09 LAB — MAGNESIUM: Magnesium: 1.7 mg/dL (ref 1.7–2.4)

## 2023-09-09 LAB — TROPONIN I (HIGH SENSITIVITY)
Troponin I (High Sensitivity): 47 ng/L — ABNORMAL HIGH (ref <18)
Troponin I (High Sensitivity): 53 ng/L — ABNORMAL HIGH (ref <18)

## 2023-09-09 MED ORDER — ACETAMINOPHEN 325 MG PO TABS
650.0000 mg | ORAL_TABLET | Freq: Four times a day (QID) | ORAL | Status: DC | PRN
  Administered 2023-09-12 – 2023-09-15 (×5): 650 mg via ORAL
  Filled 2023-09-09 (×5): qty 2

## 2023-09-09 MED ORDER — ACETAMINOPHEN 650 MG RE SUPP: 650.0000 mg | Freq: Four times a day (QID) | RECTAL | Status: DC | PRN

## 2023-09-09 MED ORDER — SODIUM CHLORIDE 0.9 % IV SOLN: INTRAVENOUS | Status: AC

## 2023-09-09 MED ORDER — MELATONIN 3 MG PO TABS
3.0000 mg | ORAL_TABLET | Freq: Every evening | ORAL | Status: DC | PRN
  Administered 2023-09-10 – 2023-09-13 (×5): 3 mg via ORAL
  Filled 2023-09-09 (×5): qty 1

## 2023-09-09 MED ORDER — ADULT MULTIVITAMIN W/MINERALS CH
1.0000 | ORAL_TABLET | Freq: Every day | ORAL | Status: DC
  Administered 2023-09-11 – 2023-09-15 (×5): 1 via ORAL
  Filled 2023-09-09 (×5): qty 1

## 2023-09-09 MED ORDER — ROSUVASTATIN CALCIUM 5 MG PO TABS
5.0000 mg | ORAL_TABLET | Freq: Every day | ORAL | Status: DC
  Administered 2023-09-09 – 2023-09-14 (×6): 5 mg via ORAL
  Filled 2023-09-09 (×6): qty 1

## 2023-09-09 MED ORDER — SODIUM CHLORIDE 0.9 % IV SOLN
500.0000 mg | Freq: Once | INTRAVENOUS | Status: AC
  Administered 2023-09-09: 500 mg via INTRAVENOUS
  Filled 2023-09-09: qty 5

## 2023-09-09 MED ORDER — FERROUS SULFATE 325 (65 FE) MG PO TABS
325.0000 mg | ORAL_TABLET | Freq: Every day | ORAL | Status: DC
  Administered 2023-09-11 – 2023-09-15 (×5): 325 mg via ORAL
  Filled 2023-09-09 (×5): qty 1

## 2023-09-09 MED ORDER — POTASSIUM CHLORIDE 10 MEQ/100ML IV SOLN
10.0000 meq | INTRAVENOUS | Status: AC
  Administered 2023-09-09 (×2): 10 meq via INTRAVENOUS
  Filled 2023-09-09 (×2): qty 100

## 2023-09-09 MED ORDER — PANTOPRAZOLE SODIUM 40 MG PO TBEC
40.0000 mg | DELAYED_RELEASE_TABLET | Freq: Every morning | ORAL | Status: DC
  Administered 2023-09-11 – 2023-09-15 (×5): 40 mg via ORAL
  Filled 2023-09-09 (×5): qty 1

## 2023-09-09 MED ORDER — SODIUM CHLORIDE 0.9 % IV SOLN
3.0000 g | Freq: Four times a day (QID) | INTRAVENOUS | Status: DC
  Administered 2023-09-10 – 2023-09-12 (×11): 3 g via INTRAVENOUS
  Filled 2023-09-09 (×11): qty 8

## 2023-09-09 MED ORDER — FOLIC ACID 1 MG PO TABS
1.0000 mg | ORAL_TABLET | Freq: Every day | ORAL | Status: DC
  Administered 2023-09-11 – 2023-09-15 (×5): 1 mg via ORAL
  Filled 2023-09-09 (×5): qty 1

## 2023-09-09 MED ORDER — IPRATROPIUM-ALBUTEROL 0.5-2.5 (3) MG/3ML IN SOLN
3.0000 mL | Freq: Four times a day (QID) | RESPIRATORY_TRACT | Status: DC | PRN
  Administered 2023-09-10 – 2023-09-15 (×5): 3 mL via RESPIRATORY_TRACT
  Filled 2023-09-09 (×5): qty 3

## 2023-09-09 MED ORDER — FUROSEMIDE 20 MG PO TABS: 40.0000 mg | ORAL_TABLET | Freq: Every day | ORAL | Status: DC

## 2023-09-09 MED ORDER — POTASSIUM CHLORIDE 10 MEQ/100ML IV SOLN
10.0000 meq | INTRAVENOUS | Status: AC
  Administered 2023-09-10 (×3): 10 meq via INTRAVENOUS
  Filled 2023-09-09 (×3): qty 100

## 2023-09-09 MED ORDER — SODIUM CHLORIDE 0.9 % IV BOLUS
500.0000 mL | Freq: Once | INTRAVENOUS | Status: AC
  Administered 2023-09-09: 500 mL via INTRAVENOUS

## 2023-09-09 MED ORDER — FLUTICASONE FUROATE-VILANTEROL 200-25 MCG/ACT IN AEPB
1.0000 | INHALATION_SPRAY | Freq: Every day | RESPIRATORY_TRACT | Status: DC
  Administered 2023-09-11 – 2023-09-15 (×5): 1 via RESPIRATORY_TRACT
  Filled 2023-09-09: qty 28

## 2023-09-09 MED ORDER — FLUOXETINE HCL 20 MG PO CAPS
20.0000 mg | ORAL_CAPSULE | Freq: Every day | ORAL | Status: DC
  Administered 2023-09-11 – 2023-09-15 (×5): 20 mg via ORAL
  Filled 2023-09-09 (×5): qty 1

## 2023-09-09 MED ORDER — TAMSULOSIN HCL 0.4 MG PO CAPS
0.4000 mg | ORAL_CAPSULE | Freq: Every day | ORAL | Status: DC
  Administered 2023-09-09 – 2023-09-14 (×6): 0.4 mg via ORAL
  Filled 2023-09-09 (×6): qty 1

## 2023-09-09 MED ORDER — APIXABAN 2.5 MG PO TABS
2.5000 mg | ORAL_TABLET | Freq: Two times a day (BID) | ORAL | Status: DC
  Administered 2023-09-09 – 2023-09-15 (×11): 2.5 mg via ORAL
  Filled 2023-09-09 (×11): qty 1

## 2023-09-09 MED ORDER — METOPROLOL TARTRATE 25 MG PO TABS
25.0000 mg | ORAL_TABLET | Freq: Every day | ORAL | Status: DC
  Administered 2023-09-11 – 2023-09-14 (×4): 25 mg via ORAL
  Filled 2023-09-09 (×5): qty 1

## 2023-09-09 MED ORDER — SODIUM CHLORIDE 0.9 % IV SOLN
1.0000 g | Freq: Once | INTRAVENOUS | Status: AC
  Administered 2023-09-09: 1 g via INTRAVENOUS
  Filled 2023-09-09: qty 10

## 2023-09-09 NOTE — ED Notes (Signed)
 D-Dimer collected

## 2023-09-09 NOTE — ED Notes (Signed)
 Family at bedside.

## 2023-09-09 NOTE — ED Provider Notes (Signed)
 [place] EMERGENCY DEPARTMENT AT [facility] Provider Note   CSN: [id number] Arrival date & time: 09/09/23  1737     History  Chief Complaint  Patient presents with   Shortness of Breath    Evan [name] is a 89 y.o. male presenting to ED with complaint of shortness of breath.  The patient is very hard of hearing and has dementia.  His family member at the bedside reports the patient was complaining of fatigue and shortness of breath today and seemed more weak than normal.  They said he could not stand up on his own.  With patient does have an indwelling Foley catheter was placed on last hospitalization.  When I review his external records he was also admitted to the hospital most recently approximately 2 to 3 weeks ago, discharged on May 22.  He is noted to have a history of heart failure, CABG, dementia, PE on anticoagulation, lung cancer, stage III kidney disease, COPD, chronic Foley.  He had been treated in early May for metapneumovirus pneumonia and heart failure.  He was treated and later in May for cellulitis and weakness.  His daughter reports his oxygen  level was 88% at home on the pulse ox  HPI     Home Medications Prior to Admission medications   Medication Sig Start Date End Date Taking? Authorizing Provider  acetaminophen  (TYLENOL ) 500 MG tablet Take 500 mg by mouth 2 (two) times daily as needed for mild pain, moderate pain, fever or headache.    [provider]  apixaban  (ELIQUIS ) 2.5 MG TABS tablet Take 1 tablet (2.5 mg total) by mouth 2 (two) times daily. 10/14/22   Gerald Kitty., NP  budesonide -formoterol  (SYMBICORT ) 160-4.5 MCG/ACT inhaler Inhale 2 puffs into the lungs in the morning and at bedtime. 06/08/23   Austine Lefort, MD  ciprofloxacin  (CILOXAN ) 0.3 % ophthalmic solution Place 2 drops into both eyes every 4 (four) hours while awake. Administer 1 drop, every 2 hours, while awake, for 2 days. Then 1 drop, every 4 hours, while awake, for the  next 5 days. Patient taking differently: Place 2 drops into both eyes every 4 (four) hours while awake. Patient is taking as needed. 03/13/23   Unk Garb, DO  ferrous sulfate  (FEROSUL) 325 (65 FE) MG tablet TAKE 1 TABLET EVERY DAY 08/15/23   Austine Lefort, MD  FLUoxetine  (PROZAC ) 20 MG capsule TAKE 1 CAPSULE EVERY DAY 03/20/23   Austine Lefort, MD  folic acid  (FOLVITE ) 1 MG tablet Take 1 tablet (1 mg total) by mouth daily. 08/21/23   [name], Curlee Doss, MD  furosemide  (LASIX ) 40 MG tablet TAKE 1 TABLET EVERY DAY 07/10/23   Austine Lefort, MD  hydrOXYzine  (ATARAX ) 10 MG/5ML syrup Take 5 mLs (10 mg total) by mouth every 8 (eight) hours as needed for itching. 08/21/23   [name], Mauricio Daniel, MD  melatonin 3 MG TABS tablet Take 1 tablet (3 mg total) by mouth at bedtime as needed (insomnia). 08/21/23   [name], Mauricio Daniel, MD  metoprolol  succinate (TOPROL -XL) 25 MG 24 hr tablet Take 12.5 mg by mouth daily.    [provider]  Multiple Vitamins-Minerals (MENS 50+ MULTIVITAMIN) TABS Take 1 tablet by mouth daily.    [provider]  mupirocin  ointment (BACTROBAN ) 2 % Apply 1 Application topically 2 (two) times daily. 07/24/23   Hyatt, [name], DPM  nitroGLYCERIN  (NITROSTAT ) 0.4 MG SL tablet Place 1 tablet (0.4 mg total) under the tongue every  5 (five) minutes as needed for chest pain. 05/16/23   Austine Lefort, MD  nystatin  (MYCOSTATIN /NYSTOP ) powder Apply a thin layer topically to the affected area 2-3 (two to three) times daily. 08/03/23     pantoprazole  (PROTONIX ) 40 MG tablet TAKE 1 TABLET EVERY MORNING 07/04/23   Austine Lefort, MD  predniSONE  (DELTASONE ) 20 MG tablet 3 tabs poqday 1-2, 2 tabs poqday 3-4, 1 tab poqday 5-6 08/25/23   Austine Lefort, MD  predniSONE  (DELTASONE ) 20 MG tablet 3 tabs poqday 1-2, 2 tabs poqday 3-4, 1 tab poqday 5-6 09/08/23   Austine Lefort, MD  rosuvastatin  (CRESTOR ) 5 MG tablet Take 1 tablet (5 mg total) by mouth at bedtime. 08/21/23    [name], Mauricio Daniel, MD  senna-docusate (STIMULANT LAXATIVE) 8.6-50 MG tablet Take 2 tablets by mouth 2 (two) times daily as needed for constipation 07/21/22     tamsulosin  (FLOMAX ) 0.4 MG CAPS capsule Take 1 capsule (0.4 mg total) by mouth daily with supper. Patient taking differently: Take 0.4 mg by mouth at bedtime. 01/26/23   Austine Lefort, MD  Zinc  Oxide (TRIPLE PASTE) 12.8 % ointment Apply topically 4 (four) times daily as needed (skin barrier). Patient taking differently: Apply 1 Application topically 2 (two) times daily as needed (skin barrier). 05/03/22   Etter Hermann., MD      Allergies    Feldene [piroxicam], Neomycin -polymyxin-hc, Statins, Latex, Tequin [gatifloxacin], Valium [diazepam], and Vibramycin  [doxycycline ]    Review of Systems   Review of Systems  Physical Exam Updated Vital Signs BP 108/61   Pulse 69   Temp 98.2 F (36.8 C) (Oral)   Resp (!) 21   Ht 5\' 2"  (1.575 m)   Wt 58.5 kg   SpO2 100%   BMI 23.59 kg/m  Physical Exam Constitutional:      General: He is not in acute distress. HENT:     Head: Normocephalic and atraumatic.  Eyes:     Conjunctiva/sclera: Conjunctivae normal.     Pupils: Pupils are equal, round, and reactive to light.  Cardiovascular:     Rate and Rhythm: Normal rate and regular rhythm.  Pulmonary:     Effort: Pulmonary effort is normal. No respiratory distress.     Comments: Rhonchorous breath sounds bilaterally, 98% on 2L [place] Abdominal:     General: There is no distension.     Tenderness: There is no abdominal tenderness.     Comments: Indwelling Foley  Skin:    General: Skin is warm and dry.  Neurological:     General: No focal deficit present.     Mental Status: He is alert. Mental status is at baseline.     ED Results / Procedures / Treatments   Labs (all labs ordered are listed, but only abnormal results are displayed) Labs Reviewed  BASIC METABOLIC PANEL WITH GFR - Abnormal; Notable for the following  components:      Result Value   Sodium 134 (*)    Potassium 2.8 (*)    CO2 21 (*)    Glucose, Bld 101 (*)    BUN 31 (*)    Calcium  8.6 (*)    All other components within normal limits  CBC - Abnormal; Notable for the following components:   WBC 15.8 (*)    RBC 2.95 (*)    Hemoglobin 10.1 (*)    HCT 29.6 (*)    MCV 100.3 (*)    MCH 34.2 (*)    Platelets 82 (*)  All other components within normal limits  TROPONIN I (HIGH SENSITIVITY) - Abnormal; Notable for the following components:   Troponin I (High Sensitivity) 47 (*)    All other components within normal limits  URINALYSIS, ROUTINE W REFLEX MICROSCOPIC  TROPONIN I (HIGH SENSITIVITY)    EKG None  Radiology CT Chest Wo Contrast Result Date: 09/09/2023 CLINICAL DATA:  Pneumonia, complication suspected, xray done outpt xray - concern for PNA EXAM: CT CHEST WITHOUT CONTRAST TECHNIQUE: Multidetector CT imaging of the chest was performed following the standard protocol without IV contrast. RADIATION DOSE REDUCTION: This exam was performed according to the departmental dose-optimization program which includes automated exposure control, adjustment of the mA and/or kV according to patient size and/or use of iterative reconstruction technique. COMPARISON:  08/23/2023, 03/13/2023 FINDINGS: Cardiovascular: Unenhanced imaging of the heart demonstrates cardiomegaly without pericardial effusion. Prominent left atrial dilatation. Postsurgical changes from prior CABG. Normal caliber of the thoracic aorta. Atherosclerosis of the aorta and coronary vasculature. Assessment of the vascular lumen cannot be performed without intravenous contrast. Mediastinum/Nodes: Borderline enlarged mediastinal adenopathy, measuring up to 10 mm in the right paratracheal station. Thyroid , trachea, and esophagus are unremarkable. Lungs/Pleura: Diffuse subpleural interlobular septal thickening consistent with chronic scarring. There is superimposed dense airspace disease  within the dependent right lower lobe consistent with aspiration or infection. Trace right pleural effusion. No pneumothorax. Central airways are patent. Upper Abdomen: No acute abnormality. Musculoskeletal: No acute or destructive bony abnormalities. Reconstructed images demonstrate no additional findings. IMPRESSION: 1. Dense right basilar consolidation consistent with infection or aspiration. 2. Trace right pleural effusion. 3. Basilar predominant subpleural scarring and fibrosis. 4.  Aortic Atherosclerosis (ICD10-I70.0). 5. Cardiomegaly, with diffuse coronary artery atherosclerosis. Electronically Signed   By: Bobbye Burrow M.D.   On: 09/09/2023 19:53    Procedures .Critical Care  Performed by: Arvilla Birmingham, MD Authorized by: Arvilla Birmingham, MD   Critical care provider statement:    Critical care time (minutes):  30   Critical care time was exclusive of:  Separately billable procedures and treating other patients   Critical care was time spent personally by me on the following activities:  Ordering and performing treatments and interventions, ordering and review of laboratory studies, ordering and review of radiographic studies, pulse oximetry, review of old charts, examination of patient and evaluation of patient's response to treatment   Care discussed with: admitting provider   Comments:     Hypoxia management for pneumonia with supplemental oxygen , IV potassium repletion.     Medications Ordered in ED Medications  cefTRIAXone  (ROCEPHIN ) 1 g in sodium chloride  0.9 % 100 mL IVPB (has no administration in time range)  azithromycin  (ZITHROMAX ) 500 mg in sodium chloride  0.9 % 250 mL IVPB (has no administration in time range)  potassium chloride  10 mEq in 100 mL IVPB (has no administration in time range)  sodium chloride  0.9 % bolus 500 mL (has no administration in time range)    ED Course/ Medical Decision Making/ A&P                                 Medical Decision  Making Amount and/or Complexity of Data Reviewed Labs: ordered. Radiology: ordered.  Risk Prescription drug management. Decision regarding hospitalization.   This patient presents to the ED with concern for shortness of breath, fatigue. This involves an extensive number of treatment options, and is a complaint that carries with it a high risk  of complications and morbidity.  The differential diagnosis includes recurring infection or pneumonia versus anemia versus dehydration versus other  Co-morbidities that complicate the patient evaluation: History of recurring pneumonia, aspiration risk  Additional history obtained from patient's family at the bedside  External records from outside source obtained and reviewed including most recent hospital discharge summary and courses  I ordered and personally interpreted labs.  The pertinent results include: Hypokalemia potassium 2.8.  White blood cell count elevated 15.8.  Troponin very mildly elevated in setting of kidney disease.  UA is pending at the time of admission  I ordered imaging studies including CT of the chest I independently visualized and interpreted imaging which showed right lower lobe infiltrate I agree with the radiologist interpretation  The patient was maintained on a cardiac monitor.  I personally viewed and interpreted the cardiac monitored which showed an underlying rhythm of: Sinus rhythm  Per my interpretation the patient's ECG shows sinus rhythm versus junctional rhythm, no acute ischemic findings  I ordered medication including IV potassium for hypokalemia, IV antibiotics for community pneumonia  I have reviewed the patients home medicines and have made adjustments as needed  Test Considered: Low suspicion for acute PE  After the interventions noted above, I reevaluated the patient and found that they have: stayed the same  I have a low clinical suspicion for sepsis.  No fever or tachycardia.  Patient overall  well-appearing otherwise on exam   Dispostion:  After consideration of the diagnostic results and the patients response to treatment, I feel that the patent would benefit from admission for management of hypokalemia, pneumonia, new oxygen  requirement 2 L..         Final Clinical Impression(s) / ED Diagnoses Final diagnoses:  Hypokalemia  Pneumonia of right lower lobe due to infectious organism    Rx / DC Orders ED Discharge Orders     None         Lukah Goswami, Janalyn Me, MD 09/09/23 2030

## 2023-09-09 NOTE — ED Triage Notes (Signed)
 The pt is c/o shortness of breath since yesterday he does not use  02 at home  no chest pain hx stroke cardiac

## 2023-09-09 NOTE — H&P (Signed)
 History and Physical    MANASES ETCHISON ZOX:[id number] DOB: [birth year] DOA: 09/09/2023  PCP: Austine Lefort, MD  Patient coming from: Home  Chief Complaint: Shortness of breath  HPI: Evan [name] is a 89 y.o. male with medical history significant of dementia, CAD status post CABG, HFpEF, CVA, hypertension, hyperlipidemia, CKD stage IIIa, COPD, PVD, history of PE on Eliquis , lung cancer, anxiety, BPH with chronic Foley, depression, esophageal ulcer, GERD, hemochromatosis, rheumatoid arthritis, chronic low back pain, chronic anemia and thrombocytopenia.  Recent admission 5/11-5/19 for metapneumovirus pneumonia and heart failure exacerbation.  Admitted again 5/21-5/22 for altered mental status and right upper extremity/hand cellulitis.  Patient presents to the ED today for evaluation of shortness of breath.  Patient has dementia and is very hard of hearing.  History provided mostly by his son at bedside who states for the past few days patient has not been feeling well.  He has been coughing and complaining of shortness of breath.  His pulse ox was 85% on room air at home.  No fevers or chills.  Son has not noticed patient having any choking episodes when eating or any vomiting but he does mention patient having history of aspiration and was previously advised to consume thickened liquids.  Patient reports improvement of his dyspnea and denies chest pain.  No other complaints.  ED Course: Requiring 2.5 L [place] to maintain sats in the 90s.  No fever or tachycardia.  Labs notable for WBC count 15.8, hemoglobin 10.1 (stable), platelet count 82k, sodium 134, potassium 2.8, BUN 31, creatinine 1.1 (stable), calcium  8.6 on BMP, troponin 47> 53. Patient's Foley catheter was exchanged.  UA with negative nitrite, moderate leukocytes, 11-20 WBCs, and few bacteria.  Urine culture pending.  CT chest showing dense right basilar consolidation consistent with infection or aspiration.  EKG showing no STEMI.  He was given  ceftriaxone , azithromycin , IV potassium 10 mEq x 3, and 500 mL normal saline.  Review of Systems:  Review of Systems  All other systems reviewed and are negative.   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   Altered mental status 08/21/2018   Anginal chest pain at rest [facility])    Chronic non, controlled on Lorazepam    Anxiety    Arthritis    "all over"    BPH (benign prostatic hyperplasia)    CAD (coronary artery disease)    Cellulitis of arm, right 06/01/2018   Cellulitis of left axilla    Chronic lower back pain    Complication of anesthesia    "problems making water afterwards"   Depression    DJD (degenerative joint disease)    Esophageal ulcer without bleeding    bid ppi indefinitely   Fall 07/16/2018   Family history of adverse reaction to anesthesia    "children w/PONV"   GERD (gastroesophageal reflux disease)    Haemophilus influenzae septicemia (HCC) 03/12/2023   Headache    "couple/week maybe" (09/04/2015)   Hemochromatosis    Possible, elevated iron  stores, hook-like osteophytes on hand films, normal LFTs   Hepatitis    "yellow jaundice as a baby"   History of ASCVD    MULTIVESSEL   Hyperlipidemia    Hypertension    Hypotension    Mild aortic stenosis 06/23/2021   Osteoarthritis    Peripheral  vascular disease (HCC)    Pneumonia    "several times; got a little now" (09/04/2015)   Pulmonary embolism (HCC) 02/02/2018   Rash and nonspecific skin eruption 06/01/2018   Rheumatoid arthritis (HCC)    RSV (respiratory syncytial virus pneumonia) 05/15/2022   Subdural hematoma (HCC)    small after fall 09/2016-plavix  held, neurosurgery consulted.  Asymptomatic.     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 PO Eliquis      Past Surgical History:  Procedure Laterality Date   ARM SKIN LESION BIOPSY / EXCISION Left 09/02/2015   CATARACT EXTRACTION W/ INTRAOCULAR LENS  IMPLANT, BILATERAL Bilateral    CHOLECYSTECTOMY OPEN     CORNEAL TRANSPLANT Bilateral    "one at [facility]; one at [facility]"   CORONARY ANGIOPLASTY WITH STENT PLACEMENT     CORONARY ARTERY BYPASS GRAFT  1999   DESCENDING AORTIC ANEURYSM REPAIR W/ STENT     DE stent ostium into the right radial free graft at OM-, 02-2007   ESOPHAGOGASTRODUODENOSCOPY N/A 11/09/2014   Procedure: ESOPHAGOGASTRODUODENOSCOPY (EGD);  Surgeon: Delilah Fend, MD;  Location[facility] ENDOSCOPY;  Service: Endoscopy;  Laterality: N/A;   INGUINAL HERNIA REPAIR     JOINT REPLACEMENT     LEFT HEART CATH AND CORS/GRAFTS ANGIOGRAPHY N/A 06/27/2017   Procedure: LEFT HEART CATH AND CORS/GRAFTS ANGIOGRAPHY;  Surgeon: Millicent Ally, MD;  Location: [facility] INVASIVE CV LAB;  Service: Cardiovascular;  Laterality: N/A;   NASAL SINUS SURGERY     SHOULDER OPEN ROTATOR CUFF REPAIR Right    TOTAL KNEE ARTHROPLASTY Bilateral      reports that he has quit smoking. His smoking use included cigarettes. He has been exposed to tobacco smoke. He has quit using smokeless tobacco.  His smokeless tobacco use included chew. He reports that he does not drink alcohol  and does not use drugs.  Allergies  Allergen Reactions   Feldene [Piroxicam] Rash    Blistering rash     Neomycin -Polymyxin-Hc Itching   Statins Other (See Comments)    Myalgias   Latex Rash    Band Aid   Tequin [Gatifloxacin] Swelling and Anxiety   Valium [Diazepam] Other (See Comments)    Dizziness    Vibramycin  [Doxycycline ] Itching and Nausea Only    Family History  Problem Relation Age of Onset   Arthritis-Osteo Sister    Heart attack Brother    Heart attack Other    Cancer Neg Hx     Prior to Admission medications   Medication Sig Start Date End Date Taking? Authorizing Provider  acetaminophen  (TYLENOL ) 500 MG tablet Take 500 mg  by mouth 2 (two) times daily as needed for mild pain, moderate pain, fever or headache.    [provider]  apixaban  (ELIQUIS ) 2.5 MG TABS tablet Take 1 tablet (2.5 mg total) by mouth 2 (two) times daily. 10/14/22   Gerald Kitty., NP  budesonide -formoterol  (SYMBICORT ) 160-4.5 MCG/ACT inhaler Inhale 2 puffs into the lungs in the morning and at bedtime. 06/08/23   Austine Lefort, MD  ciprofloxacin  (CILOXAN ) 0.3 % ophthalmic solution Place 2 drops into both eyes every 4 (four) hours while awake. Administer 1 drop, every 2 hours, while awake, for 2 days. Then 1 drop, every 4 hours, while awake, for the next 5 days. Patient taking differently: Place 2 drops into both eyes every 4 (four) hours while awake. Patient is taking as needed. 03/13/23   Unk Garb, DO  ferrous sulfate  (FEROSUL) 325 (65 FE) MG tablet  TAKE 1 TABLET EVERY DAY 08/15/23   Austine Lefort, MD  FLUoxetine  (PROZAC ) 20 MG capsule TAKE 1 CAPSULE EVERY DAY 03/20/23   Austine Lefort, MD  folic acid  (FOLVITE ) 1 MG tablet Take 1 tablet (1 mg total) by mouth daily. 08/21/23   [name], Curlee Doss, MD  furosemide  (LASIX ) 40 MG tablet TAKE 1 TABLET EVERY DAY 07/10/23   Austine Lefort, MD  hydrOXYzine  (ATARAX ) 10 MG/5ML syrup Take 5 mLs (10 mg total) by mouth every 8 (eight) hours as needed for itching. 08/21/23   [name], Mauricio Daniel, MD  melatonin 3 MG TABS tablet Take 1 tablet (3 mg total) by mouth at bedtime as needed (insomnia). 08/21/23   [name], Mauricio Daniel, MD  metoprolol  succinate (TOPROL -XL) 25 MG 24 hr tablet Take 12.5 mg by mouth daily.    [provider]  Multiple Vitamins-Minerals (MENS 50+ MULTIVITAMIN) TABS Take 1 tablet by mouth daily.    [provider]  mupirocin  ointment (BACTROBAN ) 2 % Apply 1 Application topically 2 (two) times daily. 07/24/23   Hyatt, [name], DPM  nitroGLYCERIN  (NITROSTAT ) 0.4 MG SL tablet Place 1 tablet (0.4 mg total) under the tongue every 5 (five) minutes as needed  for chest pain. 05/16/23   Austine Lefort, MD  nystatin  (MYCOSTATIN /NYSTOP ) powder Apply a thin layer topically to the affected area 2-3 (two to three) times daily. 08/03/23     pantoprazole  (PROTONIX ) 40 MG tablet TAKE 1 TABLET EVERY MORNING 07/04/23   Austine Lefort, MD  predniSONE  (DELTASONE ) 20 MG tablet 3 tabs poqday 1-2, 2 tabs poqday 3-4, 1 tab poqday 5-6 08/25/23   Austine Lefort, MD  predniSONE  (DELTASONE ) 20 MG tablet 3 tabs poqday 1-2, 2 tabs poqday 3-4, 1 tab poqday 5-6 09/08/23   Austine Lefort, MD  rosuvastatin  (CRESTOR ) 5 MG tablet Take 1 tablet (5 mg total) by mouth at bedtime. 08/21/23   [name], Mauricio Daniel, MD  senna-docusate (STIMULANT LAXATIVE) 8.6-50 MG tablet Take 2 tablets by mouth 2 (two) times daily as needed for constipation 07/21/22     tamsulosin  (FLOMAX ) 0.4 MG CAPS capsule Take 1 capsule (0.4 mg total) by mouth daily with supper. Patient taking differently: Take 0.4 mg by mouth at bedtime. 01/26/23   Austine Lefort, MD  Zinc  Oxide (TRIPLE PASTE) 12.8 % ointment Apply topically 4 (four) times daily as needed (skin barrier). Patient taking differently: Apply 1 Application topically 2 (two) times daily as needed (skin barrier). 05/03/22   Etter Hermann., MD    Physical Exam: Vitals:   09/09/23 1757 09/09/23 1758 09/09/23 1917  BP:  92/63 108/61  Pulse:  81 69  Resp:  14 (!) 21  Temp:  98.2 F (36.8 C) 98.2 F (36.8 C)  TempSrc:   Oral  SpO2:  92% 100%  Weight: 58.5 kg    Height: 5\' 2"  (1.575 m)      Physical Exam Vitals reviewed.  Constitutional:      General: He is not in acute distress. HENT:     Head: Normocephalic and atraumatic.  Eyes:     Extraocular Movements: Extraocular movements intact.  Cardiovascular:     Rate and Rhythm: Normal rate and regular rhythm.     Pulses: Normal pulses.  Pulmonary:     Effort: Pulmonary effort is normal. No respiratory distress.     Breath sounds: Rhonchi present. No wheezing or rales.   Abdominal:     General: Bowel sounds are normal. There  is no distension.     Palpations: Abdomen is soft.     Tenderness: There is no abdominal tenderness. There is no guarding.  Musculoskeletal:     Cervical back: Normal range of motion.     Right lower leg: No edema.     Left lower leg: No edema.  Skin:    General: Skin is warm and dry.  Neurological:     General: No focal deficit present.     Mental Status: He is alert and oriented to person, place, and time.     Labs on Admission: I have personally reviewed following labs and imaging studies  CBC: Recent Labs  Lab 09/09/23 1808  WBC 15.8*  HGB 10.1*  HCT 29.6*  MCV 100.3*  PLT 82*   Basic Metabolic Panel: Recent Labs  Lab 09/09/23 1808  NA 134*  K 2.8*  CL 101  CO2 21*  GLUCOSE 101*  BUN 31*  CREATININE 1.10  CALCIUM  8.6*   GFR: Estimated Creatinine Clearance: 31.7 mL/min (by C-G formula based on SCr of 1.1 mg/dL). Liver Function Tests: No results for input(s): "AST", "ALT", "ALKPHOS", "BILITOT", "PROT", "ALBUMIN" in the last 168 hours. No results for input(s): "LIPASE", "AMYLASE" in the last 168 hours. No results for input(s): "AMMONIA" in the last 168 hours. Coagulation Profile: No results for input(s): "INR", "PROTIME" in the last 168 hours. Cardiac Enzymes: No results for input(s): "CKTOTAL", "CKMB", "CKMBINDEX", "TROPONINI" in the last 168 hours. BNP (last 3 results) No results for input(s): "PROBNP" in the last 8760 hours. HbA1C: No results for input(s): "HGBA1C" in the last 72 hours. CBG: No results for input(s): "GLUCAP" in the last 168 hours. Lipid Profile: No results for input(s): "CHOL", "HDL", "LDLCALC", "TRIG", "CHOLHDL", "LDLDIRECT" in the last 72 hours. Thyroid  Function Tests: No results for input(s): "TSH", "T4TOTAL", "FREET4", "T3FREE", "THYROIDAB" in the last 72 hours. Anemia Panel: No results for input(s): "VITAMINB12", "FOLATE", "FERRITIN", "TIBC", "IRON ", "RETICCTPCT" in the  last 72 hours. Urine analysis:    Component Value Date/Time   COLORURINE AMBER (A) 09/09/2023 2010   APPEARANCEUR HAZY (A) 09/09/2023 2010   LABSPEC 1.017 09/09/2023 2010   PHURINE 5.0 09/09/2023 2010   GLUCOSEU NEGATIVE 09/09/2023 2010   HGBUR NEGATIVE 09/09/2023 2010   BILIRUBINUR NEGATIVE 09/09/2023 2010   KETONESUR NEGATIVE 09/09/2023 2010   PROTEINUR NEGATIVE 09/09/2023 2010   UROBILINOGEN 0.2 07/13/2014 1804   NITRITE NEGATIVE 09/09/2023 2010   LEUKOCYTESUR MODERATE (A) 09/09/2023 2010    Radiological Exams on Admission: CT Chest Wo Contrast Result Date: 09/09/2023 CLINICAL DATA:  Pneumonia, complication suspected, xray done outpt xray - concern for PNA EXAM: CT CHEST WITHOUT CONTRAST TECHNIQUE: Multidetector CT imaging of the chest was performed following the standard protocol without IV contrast. RADIATION DOSE REDUCTION: This exam was performed according to the departmental dose-optimization program which includes automated exposure control, adjustment of the mA and/or kV according to patient size and/or use of iterative reconstruction technique. COMPARISON:  08/23/2023, 03/13/2023 FINDINGS: Cardiovascular: Unenhanced imaging of the heart demonstrates cardiomegaly without pericardial effusion. Prominent left atrial dilatation. Postsurgical changes from prior CABG. Normal caliber of the thoracic aorta. Atherosclerosis of the aorta and coronary vasculature. Assessment of the vascular lumen cannot be performed without intravenous contrast. Mediastinum/Nodes: Borderline enlarged mediastinal adenopathy, measuring up to 10 mm in the right paratracheal station. Thyroid , trachea, and esophagus are unremarkable. Lungs/Pleura: Diffuse subpleural interlobular septal thickening consistent with chronic scarring. There is superimposed dense airspace disease within the dependent right lower lobe consistent with aspiration  or infection. Trace right pleural effusion. No pneumothorax. Central airways are  patent. Upper Abdomen: No acute abnormality. Musculoskeletal: No acute or destructive bony abnormalities. Reconstructed images demonstrate no additional findings. IMPRESSION: 1. Dense right basilar consolidation consistent with infection or aspiration. 2. Trace right pleural effusion. 3. Basilar predominant subpleural scarring and fibrosis. 4.  Aortic Atherosclerosis (ICD10-I70.0). 5. Cardiomegaly, with diffuse coronary artery atherosclerosis. Electronically Signed   By: Bobbye Burrow M.D.   On: 09/09/2023 19:53    EKG: Independently reviewed.  Sinus versus junctional rhythm, incomplete RBBB, LVH, borderline T wave abnormalities.  No STEMI.  Assessment and Plan  Acute hypoxemic respiratory failure secondary to aspiration pneumonia Leukocytosis on labs but does not meet any other SIRS criteria at this time.  No signs of sepsis.  Oxygen  saturation in the 80s on room air, currently stable on 2.5 L [place].  Continue antibiotic coverage with Unasyn.  SARS-CoV-2 PCR/respiratory viral panel ordered.  Trend WBC count.  Continue supplemental oxygen , wean as tolerated.  Keep n.p.o., gentle IV fluid hydration, SLP eval, and aspiration precautions.  Hypokalemia In the setting of diuretic use at home.  Monitor potassium and magnesium  levels, continue to replace as needed.  Mild hyponatremia Continue gentle IV fluid hydration with normal saline and monitor labs.  Mild troponin elevation Likely due to demand ischemia.  Troponin mildly elevated but flat, not consistent with ACS.  EKG without STEMI.  Patient is not endorsing chest pain.  Dementia without behavioral disturbance Delirium precautions.  CAD status post CABG Continue metoprolol  and Crestor .  Chronic HFpEF Last echo in December 2024 showing EF 60 to 65%, moderately elevated pulmonary artery systolic pressure, mild mitral regurgitation, moderate tricuspid regurgitation, and trivial aortic regurgitation.  No signs of volume overload at this time.   Check BNP.  Hypertension SBP in the low 100s.  Continue metoprolol  but hold Lasix  at this time.  Hyperlipidemia Continue Crestor .  CKD stage IIIa Creatinine stable, monitor labs.  COPD No wheezing.  Continue Symbicort  (replaced with Breo per hospital formulary).  DuoNeb as needed.  History of PE Continue Eliquis .  BPH with chronic indwelling Foley catheter Foley was exchanged in the ED. UA with negative nitrite, moderate leukocytes, 11-20 WBCs, and few bacteria.  Urine culture pending.  Continue Flomax .  Depression Continue Prozac .  GERD/history of esophageal ulcer Continue Protonix .  Chronic anemia Hemoglobin stable, monitor CBC.  Chronic thrombocytopenia No signs of bleeding, monitor CBC.  DVT prophylaxis: Eliquis  Code Status: DNR/DNI (discussed with the patient's son) Family Communication: Son at bedside. Level of care: Progressive Care Unit Admission status: It is my clinical opinion that referral for OBSERVATION is reasonable and necessary in this patient based on the above information provided. The aforementioned taken together are felt to place the patient at high risk for further clinical deterioration. However, it is anticipated that the patient may be medically stable for discharge from the hospital within 24 to 48 hours.  Juliette Oh MD [facility]  If 7PM-7AM, please contact night-coverage [email or web address]  09/09/2023, 9:47 PM

## 2023-09-09 NOTE — ED Notes (Signed)
 The pt arrived with  nasal02 po nasal 02 placed at 2 liters sats 96%

## 2023-09-10 ENCOUNTER — Observation Stay (HOSPITAL_COMMUNITY)

## 2023-09-10 DIAGNOSIS — I2489 Other forms of acute ischemic heart disease: Secondary | ICD-10-CM | POA: Diagnosis present

## 2023-09-10 DIAGNOSIS — A419 Sepsis, unspecified organism: Secondary | ICD-10-CM | POA: Diagnosis present

## 2023-09-10 DIAGNOSIS — M069 Rheumatoid arthritis, unspecified: Secondary | ICD-10-CM | POA: Diagnosis present

## 2023-09-10 DIAGNOSIS — N390 Urinary tract infection, site not specified: Secondary | ICD-10-CM | POA: Diagnosis present

## 2023-09-10 DIAGNOSIS — R0602 Shortness of breath: Secondary | ICD-10-CM | POA: Diagnosis not present

## 2023-09-10 DIAGNOSIS — R918 Other nonspecific abnormal finding of lung field: Secondary | ICD-10-CM | POA: Diagnosis not present

## 2023-09-10 DIAGNOSIS — B965 Pseudomonas (aeruginosa) (mallei) (pseudomallei) as the cause of diseases classified elsewhere: Secondary | ICD-10-CM | POA: Diagnosis present

## 2023-09-10 DIAGNOSIS — I4821 Permanent atrial fibrillation: Secondary | ICD-10-CM | POA: Diagnosis present

## 2023-09-10 DIAGNOSIS — J189 Pneumonia, unspecified organism: Secondary | ICD-10-CM | POA: Diagnosis present

## 2023-09-10 DIAGNOSIS — D631 Anemia in chronic kidney disease: Secondary | ICD-10-CM | POA: Diagnosis present

## 2023-09-10 DIAGNOSIS — J9601 Acute respiratory failure with hypoxia: Secondary | ICD-10-CM | POA: Diagnosis present

## 2023-09-10 DIAGNOSIS — Z947 Corneal transplant status: Secondary | ICD-10-CM | POA: Diagnosis not present

## 2023-09-10 DIAGNOSIS — R6520 Severe sepsis without septic shock: Secondary | ICD-10-CM | POA: Diagnosis not present

## 2023-09-10 DIAGNOSIS — J811 Chronic pulmonary edema: Secondary | ICD-10-CM | POA: Diagnosis not present

## 2023-09-10 DIAGNOSIS — I5033 Acute on chronic diastolic (congestive) heart failure: Secondary | ICD-10-CM | POA: Diagnosis present

## 2023-09-10 DIAGNOSIS — Z1152 Encounter for screening for COVID-19: Secondary | ICD-10-CM | POA: Diagnosis not present

## 2023-09-10 DIAGNOSIS — F32A Depression, unspecified: Secondary | ICD-10-CM | POA: Diagnosis present

## 2023-09-10 DIAGNOSIS — E876 Hypokalemia: Secondary | ICD-10-CM | POA: Diagnosis present

## 2023-09-10 DIAGNOSIS — J690 Pneumonitis due to inhalation of food and vomit: Secondary | ICD-10-CM | POA: Diagnosis present

## 2023-09-10 DIAGNOSIS — D696 Thrombocytopenia, unspecified: Secondary | ICD-10-CM | POA: Diagnosis present

## 2023-09-10 DIAGNOSIS — I700 Atherosclerosis of aorta: Secondary | ICD-10-CM | POA: Diagnosis not present

## 2023-09-10 DIAGNOSIS — H9190 Unspecified hearing loss, unspecified ear: Secondary | ICD-10-CM | POA: Diagnosis present

## 2023-09-10 DIAGNOSIS — I13 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 failure and stage 1 through stage 4 chronic kidney disease, or unspecified chronic kidney disease: Secondary | ICD-10-CM | POA: Diagnosis present

## 2023-09-10 DIAGNOSIS — J440 Chronic obstructive pulmonary disease with acute lower respiratory infection: Secondary | ICD-10-CM | POA: Diagnosis present

## 2023-09-10 DIAGNOSIS — F0393 Unspecified dementia, unspecified severity, with mood disturbance: Secondary | ICD-10-CM | POA: Diagnosis present

## 2023-09-10 DIAGNOSIS — N1831 Chronic kidney disease, stage 3a: Secondary | ICD-10-CM | POA: Diagnosis present

## 2023-09-10 DIAGNOSIS — Z66 Do not resuscitate: Secondary | ICD-10-CM | POA: Diagnosis present

## 2023-09-10 DIAGNOSIS — E871 Hypo-osmolality and hyponatremia: Secondary | ICD-10-CM | POA: Diagnosis present

## 2023-09-10 LAB — COMPREHENSIVE METABOLIC PANEL WITH GFR
ALT: 17 U/L (ref 0–44)
AST: 16 U/L (ref 15–41)
Albumin: 2.6 g/dL — ABNORMAL LOW (ref 3.5–5.0)
Alkaline Phosphatase: 55 U/L (ref 38–126)
Anion gap: 9 (ref 5–15)
BUN: 26 mg/dL — ABNORMAL HIGH (ref 8–23)
CO2: 22 mmol/L (ref 22–32)
Calcium: 8 mg/dL — ABNORMAL LOW (ref 8.9–10.3)
Chloride: 105 mmol/L (ref 98–111)
Creatinine, Ser: 1.04 mg/dL (ref 0.61–1.24)
GFR, Estimated: >60 mL/min (ref >60)
Glucose, Bld: 116 mg/dL — ABNORMAL HIGH (ref 70–99)
Potassium: 2.7 mmol/L — CL (ref 3.5–5.1)
Sodium: 136 mmol/L (ref 135–145)
Total Bilirubin: 1 mg/dL (ref 0.0–1.2)
Total Protein: 5.9 g/dL — ABNORMAL LOW (ref 6.5–8.1)

## 2023-09-10 LAB — BASIC METABOLIC PANEL WITH GFR
Anion gap: 7 (ref 5–15)
Anion gap: 13 (ref 5–15)
BUN: 20 mg/dL (ref 8–23)
BUN: 18 mg/dL (ref 8–23)
CO2: 20 mmol/L — ABNORMAL LOW (ref 22–32)
CO2: 19 mmol/L — ABNORMAL LOW (ref 22–32)
Calcium: 7.5 mg/dL — ABNORMAL LOW (ref 8.9–10.3)
Calcium: 7.2 mg/dL — ABNORMAL LOW (ref 8.9–10.3)
Chloride: 104 mmol/L (ref 98–111)
Chloride: 107 mmol/L (ref 98–111)
Creatinine, Ser: 1.05 mg/dL (ref 0.61–1.24)
Creatinine, Ser: 1.34 mg/dL — ABNORMAL HIGH (ref 0.61–1.24)
GFR, Estimated: >60 mL/min (ref >60)
GFR, Estimated: 49 mL/min — ABNORMAL LOW (ref >60)
Glucose, Bld: 156 mg/dL — ABNORMAL HIGH (ref 70–99)
Glucose, Bld: 175 mg/dL — ABNORMAL HIGH (ref 70–99)
Potassium: 3 mmol/L — ABNORMAL LOW (ref 3.5–5.1)
Potassium: 2.8 mmol/L — ABNORMAL LOW (ref 3.5–5.1)
Sodium: 131 mmol/L — ABNORMAL LOW (ref 135–145)
Sodium: 139 mmol/L (ref 135–145)

## 2023-09-10 LAB — RESPIRATORY PANEL BY PCR

## 2023-09-10 LAB — CBC
HCT: 28.5 % — ABNORMAL LOW (ref 39.0–52.0)
Hemoglobin: 9.4 g/dL — ABNORMAL LOW (ref 13.0–17.0)
MCH: 34.2 pg — ABNORMAL HIGH (ref 26.0–34.0)
MCHC: 33 g/dL (ref 30.0–36.0)
MCV: 103.6 fL — ABNORMAL HIGH (ref 80.0–100.0)
Platelets: 61 10*3/uL — ABNORMAL LOW (ref 150–400)
RBC: 2.75 MIL/uL — ABNORMAL LOW (ref 4.22–5.81)
RDW: 14.9 % (ref 11.5–15.5)
WBC: 11.6 10*3/uL — ABNORMAL HIGH (ref 4.0–10.5)
nRBC: 0 % (ref 0.0–0.2)

## 2023-09-10 LAB — BRAIN NATRIURETIC PEPTIDE: B Natriuretic Peptide: 927.6 pg/mL — ABNORMAL HIGH (ref 0.0–100.0)

## 2023-09-10 LAB — SARS CORONAVIRUS 2 BY RT PCR: SARS Coronavirus 2 by RT PCR: NEGATIVE

## 2023-09-10 MED ORDER — CHLORHEXIDINE GLUCONATE CLOTH 2 % EX PADS
6.0000 | MEDICATED_PAD | Freq: Every day | CUTANEOUS | Status: DC
  Administered 2023-09-10 – 2023-09-15 (×6): 6 via TOPICAL

## 2023-09-10 MED ORDER — POTASSIUM CHLORIDE 20 MEQ PO PACK
40.0000 meq | PACK | Freq: Two times a day (BID) | ORAL | Status: AC
  Administered 2023-09-10 (×2): 40 meq via ORAL
  Filled 2023-09-10 (×2): qty 2

## 2023-09-10 MED ORDER — POTASSIUM CHLORIDE 10 MEQ/100ML IV SOLN
10.0000 meq | INTRAVENOUS | Status: AC
  Administered 2023-09-10 (×2): 10 meq via INTRAVENOUS
  Filled 2023-09-10 (×2): qty 100

## 2023-09-10 NOTE — Progress Notes (Signed)
 Modified Barium Swallow Study  Patient Details  Name: Evan [name] MRN: [id number] Date of Birth: 09-24-1927  Today's Date: 09/10/2023  Modified Barium Swallow completed.  Full report located under Chart Review in the Imaging Section.  History of Present Illness Evan [name] is a 89 y.o. male with medical history significant of dementia, CAD status post CABG, HFpEF, CVA, hypertension, hyperlipidemia, CKD stage IIIa, COPD, PVD, history of PE on Eliquis , lung cancer, anxiety, BPH with chronic Foley, depression, esophageal ulcer, GERD, hemochromatosis, rheumatoid arthritis, chronic low back pain, chronic anemia and thrombocytopenia.  Recent admission 5/11-5/19 for metapneumovirus pneumonia and heart failure exacerbation.  Admitted again 5/21-5/22 for altered mental status and right upper extremity/hand cellulitis, chest congestion, SOB. Dx with acute hypoxemic respiratory failure secondary to aspiration PNA. Patient with h/o dysphagia, multiple MBS since 2020. Study in 2020 and 2024 recommending either nectar thick or thin liquids,  study complete 08/2023 recommending regular solids with nectar thick liquids due to aspiration of thin liquids.   Clinical Impression Patient presents with an age appropriate, functional oropharyngeal swallow based on today's assessment. Oral phase appropriate with timely mastication and full oral clearance. Swallow initiated at the level of the vallecula with all boluses with the acception of thin liquids which were intermittently initiated at the pyriform sinuses resulting in flash penetration of thin liquids which is considered normal for age. No aspiration observed, even when challenged with large consecutive boluses via straw. Most significant finding was notable stasis througout esophagus, only post intake of previously noted large serial swallows of thin liquid. Liquid did not clear independently over approximately 60 seconds. Given bite of pureed solid which quickly  pushed entire contents of esophagus into the stomach. Subseqent single small sips of thin liquid passed quickly. 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 Defer further w/u to MD. Education complete with son who was present for the study. Suspect that this patient, with advanced age, h/o CVA, and fluctuating dysphagia documented for at least 5 years, will continue to have fluctuating swallowing abilities with chronic risk of aspiration, exacerbated by suspected esophageal deficits. Recommending thin liquids at this time. SLP will f/u to reinforce use of precautions. Factors that may increase risk of adverse event in presence of aspiration Evan [name] & Evan [name] 2021): Respiratory or GI disease;Limited mobility  Swallow Evaluation Recommendations Recommendations: PO diet PO Diet Recommendation: Regular;Thin liquids (Level 0) Liquid Administration via: Cup;Straw Medication Administration: Whole meds with puree Supervision: Patient able to self-feed;Full supervision/cueing for swallowing strategies Swallowing strategies  : Slow rate;Small bites/sips (single sips) Postural changes: Stay upright 30-60 min after meals;Position pt fully upright for meals Oral care recommendations: Oral care BID (2x/day) Recommended consults: Consider esophageal assessment     Evan Nordby MA, CCC-SLP  Evan [name] Evan [name] 09/10/2023,10:31 AM

## 2023-09-10 NOTE — ED Notes (Signed)
Pt taken for swallow study 

## 2023-09-10 NOTE — Progress Notes (Signed)
 PROGRESS NOTE    Evan [name]  MWU:[id number] DOB: [birth year] DOA: 09/09/2023 PCP: Austine Lefort, MD     Brief Narrative:  Evan [name] is a 89 y.o. male with medical history significant of dementia, CAD status post CABG, HFpEF, CVA, hypertension, hyperlipidemia, CKD stage IIIa, COPD, PVD, history of PE on Eliquis , lung cancer, anxiety, BPH with chronic Foley, depression, esophageal ulcer, GERD, hemochromatosis, rheumatoid arthritis, chronic low back pain, chronic anemia and thrombocytopenia.  Recent admission 5/11-5/19 for metapneumovirus pneumonia and heart failure exacerbation.  Admitted again 5/21-5/22 for altered mental status and right upper extremity/hand cellulitis.   He presented to the emergency department on 09/09/2023 for shortness of breath and overall not feeling well.  Associated coughing with a pulse ox reading of 85% on room air at home.  He does not use supplemental oxygen .  Denies any difficulty swallowing as well as he is on a thickened liquid diet.  In the emergency department, he was placed on supplemental oxygen  and potassium was repleted.  Foley catheter exchanged.  CT chest showing right basilar consolidation with concern for faction/aspiration.  Started on azithromycin  and Rocephin .  He was also given fluids.  New events last 24 hours / Subjective: Patient reports feeling little bit better since the days leading up to his hospitalization.  He is hungry.  Son is worried about wet sounding lungs.  All scans show no evidence of pulmonary edema.  Assessment & Plan:   Principal Problem:   Aspiration pneumonia (HCC)  Unasyn 3 q every 6 hrs  Aspiration precautions  SLP has evaluated him  Active Problems:   Hyperlipidemia, unspecified  Crestor  5 mg/d     BPH  Flomax  0.4 mg/d    Permanent A fib (HCC)  Lopressor  25 mg twice daily  Eliquis  2.5 mg bid    Essential hypertension  Lopressor  25 mg twice daily    Thrombocytopenia (HCC)  Monitor     Hypokalemia  Replace, monitor    Hyponatremia  Stable, gentle hydration  Monitor    COPD (chronic obstructive pulmonary disease) (HCC)  DuoNebs as needed  Breo 1 puff daily    Dementia without behavioral disturbance (HCC)  Prozac  20 mg daily    Acute hypoxemic respiratory failure (HCC)  Supplemental oxygen , wean as able  PT/OT    Sepsis (HCC)  Blood cultures pending  DVT prophylaxis: Eliquis  Code Status: Limited DNR Family Communication: Son at bedside Coming From: Home Disposition Plan: Probably home as son states he will not let his dad go to SNF Barriers to Discharge: Clinical improvement  Consultants:  None   Procedures:  None   Antimicrobials:  Anti-infectives (From admission, onward)    Start     Dose/Rate Route Frequency Ordered Stop   09/09/23 2300  Ampicillin-Sulbactam (UNASYN) 3 g in sodium chloride  0.9 % 100 mL IVPB        3 g 200 mL/hr over 30 Minutes Intravenous Every 6 hours 09/09/23 2259     09/09/23 2030  cefTRIAXone  (ROCEPHIN ) 1 g in sodium chloride  0.9 % 100 mL IVPB        1 g 200 mL/hr over 30 Minutes Intravenous  Once 09/09/23 2024 09/09/23 2155   09/09/23 2030  azithromycin  (ZITHROMAX ) 500 mg in sodium chloride  0.9 % 250 mL IVPB        500 mg 250 mL/hr over 60 Minutes Intravenous  Once 09/09/23 2024 09/09/23 2300        Objective: Vitals:   09/10/23 0645 09/10/23 7253  09/10/23 0915 09/10/23 1045  BP:  105/60 (!) 117/59 118/66  Pulse: 71 72 70 71  Resp: (!) 25 (!) 21 (!) 29 (!) 26  Temp:  98.1 F (36.7 C)  98 F (36.7 C)  TempSrc:  Oral  Oral  SpO2: 99% 100% 100% 99%  Weight:      Height:        Intake/Output Summary (Last 24 hours) at 09/10/2023 1329 Last data filed at 09/10/2023 1154 Gross per 24 hour  Intake 1349.71 ml  Output --  Net 1349.71 ml   Filed Weights   09/09/23 1757  Weight: 58.5 kg    Examination:  General exam: Appears calm and comfortable  Respiratory system:  +Transmitted upper airway noises, otherwise  clear to auscultation. Respiratory effort normal. No respiratory distress. No conversational dyspnea.  Cardiovascular system: S1 & S2 heard, RRR. No pedal edema. Gastrointestinal system: Abdomen is nondistended, soft and nontender. Normal bowel sounds heard. Central nervous system: Alert and oriented. No focal neurological deficits. Speech clear.  Extremities: Symmetric in appearance  Skin: No rashes, lesions or ulcers on exposed skin  Psychiatry: Judgement and insight appear limited. Mood & affect appropriate.   Data Reviewed: I have personally reviewed following labs and imaging studies  CBC: Recent Labs  Lab 09/09/23 1808 09/10/23 0224  WBC 15.8* 11.6*  HGB 10.1* 9.4*  HCT 29.6* 28.5*  MCV 100.3* 103.6*  PLT 82* 61*   Basic Metabolic Panel: Recent Labs  Lab 09/09/23 1808 09/09/23 2100 09/10/23 0224 09/10/23 1022  NA 134*  --  136 131*  K 2.8*  --  2.7* 3.0*  CL 101  --  105 104  CO2 21*  --  22 20*  GLUCOSE 101*  --  116* 156*  BUN 31*  --  26* 20  CREATININE 1.10  --  1.04 1.05  CALCIUM  8.6*  --  8.0* 7.5*  MG  --  1.7  --   --    GFR: Estimated Creatinine Clearance: 33.2 mL/min (by C-G formula based on SCr of 1.05 mg/dL).  Liver Function Tests: Recent Labs  Lab 09/10/23 0224  AST 16  ALT 17  ALKPHOS 55  BILITOT 1.0  PROT 5.9*  ALBUMIN 2.6*    Recent Results (from the past 240 hours)  SARS Coronavirus 2 by RT PCR (hospital order, performed in [facility] hospital lab) *cepheid single result test* Anterior Nasal Swab     Status: None   Collection Time: 09/10/23  4:53 AM   Specimen: Anterior Nasal Swab  Result Value Ref Range Status   SARS Coronavirus 2 by RT PCR NEGATIVE NEGATIVE Final    Comment: Performed at [facility], [address] [place], [place] [address]  Respiratory (~20 pathogens) panel by PCR     Status: None   Collection Time: 09/10/23  4:53 AM   Specimen: Anterior Nasal Swab; Respiratory  Result Value Ref Range Status    Adenovirus NOT DETECTED NOT DETECTED Final   Coronavirus 229E NOT DETECTED NOT DETECTED Final    Comment: (NOTE) The Coronavirus on the Respiratory Panel, DOES NOT test for the novel  Coronavirus (2019 nCoV)    Coronavirus HKU1 NOT DETECTED NOT DETECTED Final   Coronavirus NL63 NOT DETECTED NOT DETECTED Final   Coronavirus OC43 NOT DETECTED NOT DETECTED Final   Metapneumovirus NOT DETECTED NOT DETECTED Final   Rhinovirus / Enterovirus NOT DETECTED NOT DETECTED Final   Influenza A NOT DETECTED NOT DETECTED Final   Influenza B  NOT DETECTED NOT DETECTED Final   Parainfluenza Virus 1 NOT DETECTED NOT DETECTED Final   Parainfluenza Virus 2 NOT DETECTED NOT DETECTED Final   Parainfluenza Virus 3 NOT DETECTED NOT DETECTED Final   Parainfluenza Virus 4 NOT DETECTED NOT DETECTED Final   Respiratory Syncytial Virus NOT DETECTED NOT DETECTED Final   Bordetella pertussis NOT DETECTED NOT DETECTED Final   Bordetella Parapertussis NOT DETECTED NOT DETECTED Final   Chlamydophila pneumoniae NOT DETECTED NOT DETECTED Final   Mycoplasma pneumoniae NOT DETECTED NOT DETECTED Final    Comment: Performed at [facility], [address], [place], [place] [address]      Radiology Studies: DG Swallowing Func-Speech Pathology Result Date: 09/10/2023 Table formatting from the original result was not included. Modified Barium Swallow Study Patient Details Name: MEHTAAB MAYEDA MRN: [id number] Date of Birth: April 14, 1927 Today's Date: 09/10/2023 HPI/PMH: HPI: NOLAN TUAZON is a 89 y.o. male with medical history significant of dementia, CAD status post CABG, HFpEF, CVA, hypertension, hyperlipidemia, CKD stage IIIa, COPD, PVD, history of PE on Eliquis , lung cancer, anxiety, BPH with chronic Foley, depression, esophageal ulcer, GERD, hemochromatosis, rheumatoid arthritis, chronic low back pain, chronic anemia and thrombocytopenia.  Recent admission 5/11-5/19 for metapneumovirus pneumonia and heart failure exacerbation.   Admitted again 5/21-5/22 for altered mental status and right upper extremity/hand cellulitis, chest congestion, SOB. Dx with acute hypoxemic respiratory failure secondary to aspiration PNA. Patient with h/o dysphagia, multiple MBS since 2020. Study in 2020 and 2024 recommending either nectar thick or thin liquids,  study complete 08/2023 recommending regular solids with nectar thick liquids due to aspiration of thin liquids. Clinical Impression: Clinical Impression: Patient presents with an age appropriate, functional oropharyngeal swallow based on today's assessment. Oral phase appropriate with timely mastication and full oral clearance. Swallow initiated at the level of the vallecula with all boluses with the acception of thin liquids which were intermittently initiated at the pyriform sinuses resulting in flash penetration of thin liquids which is considered normal for age. No aspiration observed, even when challenged with large consecutive boluses via straw. Most significant finding was notable stasis througout esophagus, only post intake of previously noted large serial swallows of thin liquid. Liquid did not clear independently over approximately 60 seconds. Given bite of pureed solid which quickly pushed entire contents of esophagus into the stomach. Subseqent single small sips of thin liquid passed quickly. 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 Defer further w/u to MD. Education complete with son who was present for the study. Suspect that this patient, with advanced age, h/o CVA, and fluctuating dysphagia documented for at least 5 years, will continue to have fluctuating swallowing abilities with chronic risk of aspiration, exacerbated by suspected esophageal deficits. Recommending thin liquids at this time. SLP will f/u to reinforce use of precautions. Factors that may increase risk of adverse event in presence of aspiration Roderick Civatte & Jessy Morocco 2021): Factors that may increase risk of adverse event in  presence of aspiration Roderick Civatte & Jessy Morocco 2021): Respiratory or GI disease; Limited mobility Recommendations/Plan: Swallowing Evaluation Recommendations Swallowing Evaluation Recommendations Recommendations: PO diet PO Diet Recommendation: Regular; Thin liquids (Level 0) Liquid Administration via: Cup; Straw Medication Administration: Whole meds with puree Supervision: Patient able to self-feed; Full supervision/cueing for swallowing strategies Swallowing strategies  : Slow rate; Small bites/sips (single sips) Postural changes: Stay upright 30-60 min after meals; Position pt fully upright for meals Oral care recommendations: Oral care BID (2x/day) Recommended consults: Consider esophageal assessment Treatment Plan Treatment Plan Treatment recommendations:  Therapy as outlined in treatment plan below Follow-up recommendations: Home health SLP Functional status assessment: Patient has had a recent decline in their functional status and demonstrates the ability to make significant improvements in function in a reasonable and predictable amount of time. Treatment frequency: Min 1x/week Treatment duration: 1 week Interventions: Aspiration precaution training; Compensatory techniques; Patient/family education; Diet toleration management by SLP Recommendations Recommendations for follow up therapy are one component of a multi-disciplinary discharge planning process, led by the attending physician.  Recommendations may be updated based on patient status, additional functional criteria and insurance authorization. Assessment: Orofacial Exam: Orofacial Exam Oral Cavity: Oral Hygiene: WFL Oral Cavity - Dentition: Adequate natural dentition Orofacial Anatomy: WFL Oral Motor/Sensory Function: Suspected cranial nerve impairment CN VII - Facial: Right motor impairment (baseline) Anatomy: Anatomy: Other (Comment) (what appears to be calcification along posterior pharyhngeal wall, above UES again noted, unchanged from previous  studies) Boluses Administered: Boluses Administered Boluses Administered: Thin liquids (Level 0); Mildly thick liquids (Level 2, nectar thick); Puree; Solid  Oral Impairment Domain: Oral Impairment Domain Lip Closure: Escape beyond mid-chin Tongue control during bolus hold: Cohesive bolus between tongue to palatal seal Bolus preparation/mastication: Slow prolonged chewing/mashing with complete recollection Bolus transport/lingual motion: Brisk tongue motion Initiation of pharyngeal swallow : Pyriform sinuses  Pharyngeal Impairment Domain: Pharyngeal Impairment Domain Soft palate elevation: No bolus between soft palate (SP)/pharyngeal wall (PW) Laryngeal elevation: Complete superior movement of thyroid  cartilage with complete approximation of arytenoids to epiglottic petiole Anterior hyoid excursion: Complete anterior movement Epiglottic movement: Complete inversion Laryngeal vestibule closure: Incomplete, narrow column air/contrast in laryngeal vestibule Pharyngeal stripping wave : Present - complete Pharyngeal contraction (A/P view only): N/A Pharyngoesophageal segment opening: Complete distension and complete duration, no obstruction of flow Tongue base retraction: No contrast between tongue base and posterior pharyngeal wall (PPW) Pharyngeal residue: Complete pharyngeal clearance  Esophageal Impairment Domain: Esophageal Impairment Domain Esophageal clearance upright position: Esophageal retention Pill: Pill Consistency administered: Mildly thick liquids (Level 2, nectar thick) Mildly thick liquids (Level 2, nectar thick): WFL Penetration/Aspiration Scale Score: Penetration/Aspiration Scale Score 1.  Material does not enter airway: Mildly thick liquids (Level 2, nectar thick); Puree; Solid; Pill 2.  Material enters airway, remains ABOVE vocal cords then ejected out: Thin liquids (Level 0) Compensatory Strategies: No data recorded  General Information: Caregiver present: Yes  Diet Prior to this Study: NPO    Temperature : Normal   Respiratory Status: WFL   Supplemental O2: Nasal cannula   History of Recent Intubation: No  Behavior/Cognition: Alert; Cooperative; Pleasant mood; Requires cueing Self-Feeding Abilities: Able to self-feed Baseline vocal quality/speech: Dysphonic Volitional Cough: Able to elicit Volitional Swallow: Able to elicit No data recorded Goal Planning: Prognosis for improved oropharyngeal function: Good No data recorded No data recorded No data recorded Consulted and agree with results and recommendations: Patient; Family member/caregiver Pain: Pain Assessment Pain Assessment: No/denies pain End of Session: Start Time:SLP Start Time (ACUTE ONLY): 0935 Stop Time: SLP Stop Time (ACUTE ONLY): 0955 Time Calculation:SLP Time Calculation (min) (ACUTE ONLY): 20 min Charges: SLP Evaluations $ SLP Speech Visit: 1 Visit SLP Evaluations $BSS Swallow: 1 Procedure $MBS Swallow: 1 Procedure SLP visit diagnosis: SLP Visit Diagnosis: Dysphagia, oropharyngeal phase (R13.12) Past Medical History: Past Medical History: Diagnosis Date  Acute blood loss anemia   Acute bronchitis 05/15/2017  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 07/14/2018  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  Altered mental status 08/21/2018  Anginal chest pain at rest [facility])  Chronic non, controlled on Lorazepam   Anxiety   Arthritis   "all over"   BPH (benign prostatic hyperplasia)   CAD (coronary artery disease)   Cellulitis of arm, right 06/01/2018  Cellulitis of left axilla   Chronic lower back pain   Complication of anesthesia   "problems making water afterwards"  Depression   DJD (degenerative joint disease)   Esophageal ulcer without bleeding   bid ppi indefinitely  Fall 07/16/2018  Family history of adverse reaction to anesthesia   "children w/PONV"  GERD (gastroesophageal reflux disease)   Haemophilus influenzae septicemia (HCC) 03/12/2023  Headache   "couple/week maybe"  (09/04/2015)  Hemochromatosis   Possible, elevated iron  stores, hook-like osteophytes on hand films, normal LFTs  Hepatitis   "yellow jaundice as a baby"  History of ASCVD   MULTIVESSEL  Hyperlipidemia   Hypertension   Hypotension   Mild aortic stenosis 06/23/2021  Osteoarthritis   Peripheral vascular disease (HCC)   Pneumonia   "several times; got a little now" (09/04/2015)  Pulmonary embolism (HCC) 02/02/2018  Rash and nonspecific skin eruption 06/01/2018  Rheumatoid arthritis (HCC)   RSV (respiratory syncytial virus pneumonia) 05/15/2022  Subdural hematoma (HCC)   small after fall 09/2016-plavix  held, neurosurgery consulted.  Asymptomatic.    Thromboembolism (HCC) 02/2018  on Lovenox  lifelong since he failed PO Eliquis  Past Surgical History: Past Surgical History: Procedure Laterality Date  ARM SKIN LESION BIOPSY / EXCISION Left 09/02/2015  CATARACT EXTRACTION W/ INTRAOCULAR LENS  IMPLANT, BILATERAL Bilateral   CHOLECYSTECTOMY OPEN    CORNEAL TRANSPLANT Bilateral   "one [facility]; one at [facility]"  CORONARY ANGIOPLASTY WITH STENT PLACEMENT    CORONARY ARTERY BYPASS GRAFT  1999  DESCENDING AORTIC ANEURYSM REPAIR W/ STENT    DE stent ostium into the right radial free graft at OM-, 02-2007  ESOPHAGOGASTRODUODENOSCOPY N/A 11/09/2014  Procedure: ESOPHAGOGASTRODUODENOSCOPY (EGD);  Surgeon: Delilah Fend, MD;  Location: [facility] ENDOSCOPY;  Service: Endoscopy;  Laterality: N/A;  INGUINAL HERNIA REPAIR    JOINT REPLACEMENT    LEFT HEART CATH AND CORS/GRAFTS ANGIOGRAPHY N/A 06/27/2017  Procedure: LEFT HEART CATH AND CORS/GRAFTS ANGIOGRAPHY;  Surgeon: Millicent Ally, MD;  Location: [facility] INVASIVE CV LAB;  Service: Cardiovascular;  Laterality: N/A;  NASAL SINUS SURGERY    SHOULDER OPEN ROTATOR CUFF REPAIR Right   TOTAL KNEE ARTHROPLASTY Bilateral  Delsa Fife MA, CCC-SLP s Delsa Fife Meryl 09/10/2023, 10:32 AM  DG Chest 1 View Result Date: 09/10/2023 CLINICAL DATA:  Shortness of breath. EXAM: CHEST  1 VIEW COMPARISON:  Chest radiograph dated  08/23/2023. FINDINGS: There is diffuse chronic intra coarsening. Right lung base atelectasis/scarring. Pneumonia is not excluded. No large pleural effusion. No pneumothorax. Stable cardiac silhouette. Median sternotomy wires. No acute osseous pathology. IMPRESSION: Right lung base atelectasis/scarring. Pneumonia is not excluded. Electronically Signed   By: Angus Bark M.D.   On: 09/10/2023 09:44   CT Chest Wo Contrast Result Date: 09/09/2023 CLINICAL DATA:  Pneumonia, complication suspected, xray done outpt xray - concern for PNA EXAM: CT CHEST WITHOUT CONTRAST TECHNIQUE: Multidetector CT imaging of the chest was performed following the standard protocol without IV contrast. RADIATION DOSE REDUCTION: This exam was performed according to the departmental dose-optimization program which includes automated exposure control, adjustment of the mA and/or kV according to patient size and/or use of iterative reconstruction technique. COMPARISON:  08/23/2023, 03/13/2023 FINDINGS: Cardiovascular: Unenhanced imaging of the heart demonstrates cardiomegaly without pericardial effusion. Prominent left atrial dilatation. Postsurgical changes from prior CABG. Normal caliber of the thoracic aorta.  Atherosclerosis of the aorta and coronary vasculature. Assessment of the vascular lumen cannot be performed without intravenous contrast. Mediastinum/Nodes: Borderline enlarged mediastinal adenopathy, measuring up to 10 mm in the right paratracheal station. Thyroid , trachea, and esophagus are unremarkable. Lungs/Pleura: Diffuse subpleural interlobular septal thickening consistent with chronic scarring. There is superimposed dense airspace disease within the dependent right lower lobe consistent with aspiration or infection. Trace right pleural effusion. No pneumothorax. Central airways are patent. Upper Abdomen: No acute abnormality. Musculoskeletal: No acute or destructive bony abnormalities. Reconstructed images demonstrate no  additional findings. IMPRESSION: 1. Dense right basilar consolidation consistent with infection or aspiration. 2. Trace right pleural effusion. 3. Basilar predominant subpleural scarring and fibrosis. 4.  Aortic Atherosclerosis (ICD10-I70.0). 5. Cardiomegaly, with diffuse coronary artery atherosclerosis. Electronically Signed   By: Bobbye Burrow M.D.   On: 09/09/2023 19:53     Scheduled Meds:  apixaban   2.5 mg Oral BID   ferrous sulfate   325 mg Oral Daily   FLUoxetine   20 mg Oral Daily   fluticasone  furoate-vilanterol  1 puff Inhalation Daily   folic acid   1 mg Oral Daily   metoprolol  tartrate  25 mg Oral Daily   multivitamin with minerals  1 tablet Oral Daily   pantoprazole   40 mg Oral q morning   rosuvastatin   5 mg Oral QHS   tamsulosin   0.4 mg Oral QHS   Continuous Infusions:  ampicillin-sulbactam (UNASYN) IV Stopped (09/10/23 1154)     LOS: 0 days    Time spent: 36 minutes   Jobe Mulder, DO [facility] 09/10/2023, 1:29 PM   Available via Epic secure chat 7am-7pm After these hours, please refer to coverage provider listed on [email or web address]

## 2023-09-10 NOTE — ED Notes (Addendum)
 Pt son demanding to see a doctor. States that his dad is worse than before. He wants to know how much fluid he has on his lungs. Pt son refused last round of potassium at first but then agreed. He has refused breathing treatment. MD notified. Pt in no acute distress.

## 2023-09-10 NOTE — ED Notes (Signed)
 Pt son assisting with feeding pt. Pt repositioned in bed and son advised to leave him sitting up for at least 30 minutes after eating.

## 2023-09-10 NOTE — Plan of Care (Signed)
  Problem: Clinical Measurements: Goal: Respiratory complications will improve Outcome: Progressing   Problem: Activity: Goal: Risk for activity intolerance will decrease Outcome: Progressing   Problem: Nutrition: Goal: Adequate nutrition will be maintained Outcome: Progressing   Problem: Pain Managment: Goal: General experience of comfort will improve and/or be controlled Outcome: Progressing   Problem: Safety: Goal: Ability to remain free from injury will improve Outcome: Progressing   Problem: Skin Integrity: Goal: Risk for impaired skin integrity will decrease Outcome: Progressing

## 2023-09-10 NOTE — Evaluation (Signed)
 Clinical/Bedside Swallow Evaluation Patient Details  Name: Evan [name] MRN: [id number] Date of Birth: 02-Oct-1927  Today's Date: 09/10/2023 Time: SLP Start Time (ACUTE ONLY): 4098 SLP Stop Time (ACUTE ONLY): 0910 SLP Time Calculation (min) (ACUTE ONLY): 19 min  Past Medical History:  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   Altered mental status 08/21/2018   Anginal chest pain at rest [facility])    Chronic non, controlled on Lorazepam    Anxiety    Arthritis    "all over"    BPH (benign prostatic hyperplasia)    CAD (coronary artery disease)    Cellulitis of arm, right 06/01/2018   Cellulitis of left axilla    Chronic lower back pain    Complication of anesthesia    "problems making water afterwards"   Depression    DJD (degenerative joint disease)    Esophageal ulcer without bleeding    bid ppi indefinitely   Fall 07/16/2018   Family history of adverse reaction to anesthesia    "children w/PONV"   GERD (gastroesophageal reflux disease)    Haemophilus influenzae septicemia (HCC) 03/12/2023   Headache    "couple/week maybe" (09/04/2015)   Hemochromatosis    Possible, elevated iron  stores, hook-like osteophytes on hand films, normal LFTs   Hepatitis    "yellow jaundice as a baby"   History of ASCVD    MULTIVESSEL   Hyperlipidemia    Hypertension    Hypotension    Mild aortic stenosis 06/23/2021   Osteoarthritis    Peripheral vascular disease (HCC)    Pneumonia    "several times; got a little now" (09/04/2015)   Pulmonary embolism (HCC) 02/02/2018   Rash and nonspecific skin eruption 06/01/2018   Rheumatoid arthritis (HCC)    RSV (respiratory syncytial virus pneumonia) 05/15/2022   Subdural hematoma (HCC)    small after fall 09/2016-plavix  held,  neurosurgery consulted.  Asymptomatic.     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 PO Eliquis    Past Surgical History:  Past Surgical History:  Procedure Laterality Date   ARM SKIN LESION BIOPSY / EXCISION Left 09/02/2015   CATARACT EXTRACTION W/ INTRAOCULAR LENS  IMPLANT, BILATERAL Bilateral    CHOLECYSTECTOMY OPEN     CORNEAL TRANSPLANT Bilateral    "one at [facility]; one at [facility]"   CORONARY ANGIOPLASTY WITH STENT PLACEMENT     CORONARY ARTERY BYPASS GRAFT  1999   DESCENDING AORTIC ANEURYSM REPAIR W/ STENT     DE stent ostium into the right radial free graft at OM-, 02-2007   ESOPHAGOGASTRODUODENOSCOPY N/A 11/09/2014   Procedure: ESOPHAGOGASTRODUODENOSCOPY (EGD);  Surgeon: Delilah Fend, MD;  Location: [facility] ENDOSCOPY;  Service: Endoscopy;  Laterality: N/A;   INGUINAL HERNIA REPAIR     JOINT REPLACEMENT     LEFT HEART CATH AND CORS/GRAFTS ANGIOGRAPHY N/A 06/27/2017   Procedure: LEFT HEART CATH AND CORS/GRAFTS ANGIOGRAPHY;  Surgeon: Millicent Ally, MD;  Location: [facility] INVASIVE CV LAB;  Service: Cardiovascular;  Laterality: N/A;   NASAL SINUS SURGERY     SHOULDER OPEN ROTATOR CUFF REPAIR Right    TOTAL KNEE ARTHROPLASTY Bilateral    HPI:  CAULDER [name] is a 89 y.o. male with medical history significant of dementia, CAD status post CABG, HFpEF, CVA, hypertension, hyperlipidemia, CKD stage IIIa, COPD, PVD, history  of PE on Eliquis , lung cancer, anxiety, BPH with chronic Foley, depression, esophageal ulcer, GERD, hemochromatosis, rheumatoid arthritis, chronic low back pain, chronic anemia and thrombocytopenia.  Recent admission 5/11-5/19 for metapneumovirus pneumonia and heart failure exacerbation.  Admitted again 5/21-5/22 for altered mental status and right upper extremity/hand cellulitis, chest congestion, SOB. Dx with acute hypoxemic respiratory failure secondary to aspiration PNA. Patient with h/o dysphagia, multiple MBS since 2020. Study in 2020 and 2024 recommending either  nectar thick or thin liquids,  study complete 08/2023 recommending regular solids with nectar thick liquids due to aspiration of thin liquids.    Assessment / Plan / Recommendation  Clinical Impression  Swallow evaluated at bedside. Son present and able to provide history. Reports that patient is consuming nectar thick liquids at home as recommended by most recent MBS in 08/2023 however does occassionally take meds whole with thin liquids. At bedside, patient with good oral manipulation of bolus with occassional right sided anterior labial spillage, baseline due to h/o CVA. Po trials with seemingly with good intact oropharyngeal abilities however intermittent congested coughing noted at baseline, increasing during po intake and raising concern for decreased airway protection.  Per son and chart review plans were for repeat OP MBS to evaluate for improved swallowing abilities from last admission. Given another acute illness, and for presumed aspiration PNA, doubtful that abilities have changed significantly however I do think its reasonable to repeat MBS given acute dx and question of aspiration related coughing at bedside with po intake. Son in agreement. Will proceed with MBS. SLP Visit Diagnosis: Dysphagia, oropharyngeal phase (R13.12)       Diet Recommendation NPO    Medication Administration: Via alternative means    Other  Recommendations Oral Care Recommendations: Oral care QID                  Prognosis Prognosis for improved oropharyngeal function: Good      Swallow Study   General HPI: Evan [name] is a 89 y.o. male with medical history significant of dementia, CAD status post CABG, HFpEF, CVA, hypertension, hyperlipidemia, CKD stage IIIa, COPD, PVD, history of PE on Eliquis , lung cancer, anxiety, BPH with chronic Foley, depression, esophageal ulcer, GERD, hemochromatosis, rheumatoid arthritis, chronic low back pain, chronic anemia and thrombocytopenia.  Recent admission 5/11-5/19 for  metapneumovirus pneumonia and heart failure exacerbation.  Admitted again 5/21-5/22 for altered mental status and right upper extremity/hand cellulitis, chest congestion, SOB. Dx with acute hypoxemic respiratory failure secondary to aspiration PNA. Patient with h/o dysphagia, multiple MBS since 2020. Study in 2020 and 2024 recommending either nectar thick or thin liquids,  study complete 08/2023 recommending regular solids with nectar thick liquids due to aspiration of thin liquids. Type of Study: Bedside Swallow Evaluation Previous Swallow Assessment: MBSS 08/2023, 03/10/23, 02/17/22, 10/09/18 Diet Prior to this Study: NPO Temperature Spikes Noted: No Respiratory Status: Nasal cannula History of Recent Intubation: No Behavior/Cognition: Alert;Cooperative;Pleasant mood;Requires cueing Oral Cavity Assessment: Dry Oral Care Completed by SLP: No Oral Cavity - Dentition: Adequate natural dentition Vision: Functional for self-feeding Self-Feeding Abilities: Able to feed self Patient Positioning: Upright in bed Baseline Vocal Quality: Hoarse Volitional Cough: Congested Volitional Swallow: Able to elicit    Oral/Motor/Sensory Function Overall Oral Motor/Sensory Function: Moderate impairment Facial ROM: Reduced left Facial Symmetry: Abnormal symmetry left Facial Strength: Reduced left Facial Sensation: Within Functional Limits Lingual ROM: Within Functional Limits Lingual Symmetry: Within Functional Limits Lingual Strength: Reduced Lingual Sensation: Within Functional Limits Velum: Within Functional Limits Mandible: Within Functional  Limits   Ice Chips Ice chips: Not tested   Thin Liquid Thin Liquid: Not tested    Nectar Thick Nectar Thick Liquid: Impaired Presentation: Cup;Straw;Self Fed Oral Phase Impairments: Reduced labial seal Oral phase functional implications: Right anterior spillage Pharyngeal Phase Impairments: Cough - Delayed   Honey Thick Honey Thick Liquid: Not tested   Puree  Puree: Within functional limits Presentation: Spoon   Solid     Solid: Impaired Presentation: Self Fed Pharyngeal Phase Impairments: Cough - Delayed     Chyna Kneece MA, CCC-SLP  Jossette Zirbel Meryl 09/10/2023,10:16 AM

## 2023-09-10 NOTE — Plan of Care (Signed)
  Problem: Clinical Measurements: Goal: Respiratory complications will improve 09/10/2023 1606 by Daylene Evangelist, RN Outcome: Progressing 09/10/2023 1606 by Daylene Evangelist, RN Outcome: Progressing   Problem: Activity: Goal: Risk for activity intolerance will decrease 09/10/2023 1606 by Daylene Evangelist, RN Outcome: Progressing 09/10/2023 1606 by Daylene Evangelist, RN Outcome: Progressing   Problem: Nutrition: Goal: Adequate nutrition will be maintained 09/10/2023 1606 by Daylene Evangelist, RN Outcome: Progressing 09/10/2023 1606 by Daylene Evangelist, RN Outcome: Progressing   Problem: Elimination: Goal: Will not experience complications related to bowel motility Outcome: Progressing   Problem: Pain Managment: Goal: General experience of comfort will improve and/or be controlled 09/10/2023 1606 by Daylene Evangelist, RN Outcome: Progressing 09/10/2023 1606 by Daylene Evangelist, RN Outcome: Progressing   Problem: Safety: Goal: Ability to remain free from injury will improve 09/10/2023 1606 by Daylene Evangelist, RN Outcome: Progressing 09/10/2023 1606 by Daylene Evangelist, RN Outcome: Progressing   Problem: Skin Integrity: Goal: Risk for impaired skin integrity will decrease 09/10/2023 1606 by Daylene Evangelist, RN Outcome: Progressing 09/10/2023 1606 by Daylene Evangelist, RN Outcome: Progressing

## 2023-09-11 DIAGNOSIS — J690 Pneumonitis due to inhalation of food and vomit: Secondary | ICD-10-CM | POA: Diagnosis not present

## 2023-09-11 LAB — BASIC METABOLIC PANEL WITH GFR
Anion gap: 7 (ref 5–15)
BUN: 13 mg/dL (ref 8–23)
CO2: 19 mmol/L — ABNORMAL LOW (ref 22–32)
Calcium: 7.6 mg/dL — ABNORMAL LOW (ref 8.9–10.3)
Chloride: 109 mmol/L (ref 98–111)
Creatinine, Ser: 0.94 mg/dL (ref 0.61–1.24)
GFR, Estimated: >60 mL/min (ref >60)
Glucose, Bld: 114 mg/dL — ABNORMAL HIGH (ref 70–99)
Potassium: 3.9 mmol/L (ref 3.5–5.1)
Sodium: 135 mmol/L (ref 135–145)

## 2023-09-11 LAB — CBC
HCT: 24.8 % — ABNORMAL LOW (ref 39.0–52.0)
Hemoglobin: 8.3 g/dL — ABNORMAL LOW (ref 13.0–17.0)
MCH: 34.4 pg — ABNORMAL HIGH (ref 26.0–34.0)
MCHC: 33.5 g/dL (ref 30.0–36.0)
MCV: 102.9 fL — ABNORMAL HIGH (ref 80.0–100.0)
Platelets: 58 10*3/uL — ABNORMAL LOW (ref 150–400)
RBC: 2.41 MIL/uL — ABNORMAL LOW (ref 4.22–5.81)
RDW: 14.9 % (ref 11.5–15.5)
WBC: 7.9 10*3/uL (ref 4.0–10.5)
nRBC: 0 % (ref 0.0–0.2)

## 2023-09-11 NOTE — TOC CM/SW Note (Signed)
    Durable Medical Equipment  (From admission, onward)           Start     Ordered   09/11/23 1331  For home use only DME standard manual wheelchair with seat cushion  Once       Comments: Patient suffers from  recent decline in their functional status, COPD, HF, dementia, CVA, aspiration pneumonia,  which impairs their ability to perform daily activities like ambulating  in the home.  A cane  will not resolve issue with performing activities of daily living. A wheelchair will allow patient to safely perform daily activities. Patient can safely propel the wheelchair in the home or has a caregiver who can provide assistance. Length of need lifetime . Accessories: elevating leg rests (ELRs), wheel locks, extensions and anti-tippers.  Seat and back cushions   09/11/23 1331

## 2023-09-11 NOTE — Progress Notes (Signed)
 Speech Language Pathology Treatment: Dysphagia  Patient Details Name: Evan [name] MRN: [id number] DOB: 1927-11-29 Today's Date: 09/11/2023 Time: 2595-6387 SLP Time Calculation (min) (ACUTE ONLY): 14 min  Assessment / Plan / Recommendation Clinical Impression  Patient seen for diet tolerance assessment and patient/family education. Son in room with patient and able to independently verbalize understanding of compensatory strategies taught following MBS previous date. Patient alert and cooperative, in good upright position in chair for po intake. Patient able to self feed prescribed diet, regular solids with thin liquids with max verbal, visual, and tactile cueing for use of precautions. Given HOH and dementia, patient will need ongoing full supervision with po intake to decrease aspiration risk which is chronic in nature. Family aware. No SLP f/u indicated at this time. Recommend brief HH SLP services if possible to reinforce use of precautions in home environment.    HPI HPI: Evan [name] is a 89 y.o. male with medical history significant of dementia, CAD status post CABG, HFpEF, CVA, hypertension, hyperlipidemia, CKD stage IIIa, COPD, PVD, history of PE on Eliquis , lung cancer, anxiety, BPH with chronic Foley, depression, esophageal ulcer, GERD, hemochromatosis, rheumatoid arthritis, chronic low back pain, chronic anemia and thrombocytopenia.  Recent admission 5/11-5/19 for metapneumovirus pneumonia and heart failure exacerbation.  Admitted again 5/21-5/22 for altered mental status and right upper extremity/hand cellulitis, chest congestion, SOB. Dx with acute hypoxemic respiratory failure secondary to aspiration PNA. Patient with h/o dysphagia, multiple MBS since 2020. Study in 2020 and 2024 recommending either nectar thick or thin liquids,  study complete 08/2023 recommending regular solids with nectar thick liquids due to aspiration of thin liquids.      SLP Plan  All goals met           Recommendations  Diet recommendations: Regular;Thin liquid Liquids provided via: Straw;Cup Medication Administration: Whole meds with puree Supervision: Patient able to self feed;Full supervision/cueing for compensatory strategies Compensations: Slow rate;Small sips/bites;Follow solids with liquid Postural Changes and/or Swallow Maneuvers: Seated upright 90 degrees;Upright 30-60 min after meal                  Oral care BID   Frequent or constant Supervision/Assistance Dysphagia, oropharyngeal phase (R13.12)     All goals met   Evan Fife MA, CCC-SLP   Evan [name] Evan [name]  09/11/2023, 11:37 AM

## 2023-09-11 NOTE — Progress Notes (Signed)
 Pt refusing to ambulate in hallway. Son complaining about IV abx tx. States he doesn't want his father having any extra fluid. Educated him on abx tx and IV running KVO which can be saline locked once abx finishes.

## 2023-09-11 NOTE — Evaluation (Signed)
 Physical Therapy Evaluation Patient Details Name: Evan [name] MRN: [id number] DOB: [birth year] Today's Date: 09/11/2023  History of Present Illness  89 yo male presents to [facility] on 6/7 with SOB. Admitted for possible aspiration pneumonia. PMH includes  dementia, CAD, CABG, HFpEF, CVA, PE on anticoagulation, lung cancer, hypertension, CKD 3B, COPD, BPH with chronic Foley. Recent admissions to [facility] from 5/11-19 for metapneumovirus pneumonia and heart failure exacerbation; 5/21-5/22 for AMS and R hand cellulitis.  Clinical Impression   Pt presents with LE weakness with history of CVA and R hemiparesis, impaired balance, poor activity tolerance, and mod difficulty mobilizing at transfer level. Pt to benefit from acute PT to address deficits. Pt requiring mod-max +2 assist for transfer-level mobility this date, pt having to stand x3 given actively having BM throughout PT and OT joint evaluation. Pt with limited tolerance for standing at this time with significant buckling with taking steps, recommend use of stedy for back to bed. PT discussed d/c options, including SNF vs HHPT, pt's daughter states they want to take the pt home. PT to progress mobility as tolerated, and will continue to follow acutely.          If plan is discharge home, recommend the following: A lot of help with walking and/or transfers;A lot of help with bathing/dressing/bathroom;Assist for transportation;Help with stairs or ramp for entrance   Can travel by private vehicle        Equipment Recommendations None recommended by PT  Recommendations for Other Services       Functional Status Assessment Patient has had a recent decline in their functional status and demonstrates the ability to make significant improvements in function in a reasonable and predictable amount of time.     Precautions / Restrictions Precautions Precautions: Fall Recall of Precautions/Restrictions: Impaired Required Braces or Orthoses: Other Brace Other  Brace: R LE metal upright AFO from home Restrictions Weight Bearing Restrictions Per Provider Order: No      Mobility  Bed Mobility Overal bed mobility: Needs Assistance Bed Mobility: Supine to Sit     Supine to sit: Mod assist, +2 for physical assistance     General bed mobility comments: increased cueing but mod assist to manage LEs and trunk to EOB    Transfers Overall transfer level: Needs assistance Equipment used: Rolling walker (2 wheels), 1 person hand held assist Transfers: Sit to/from Stand, Bed to chair/wheelchair/BSC Sit to Stand: +2 physical assistance, Mod assist   Step pivot transfers: Max assist, +2 physical assistance       General transfer comment: requires increased assist to stand and step to recliner, buckling when stepping away from bed. stand x3, from EOB x2 with pericare for stool and x1 from recliner.    Ambulation/Gait               General Gait Details: unable  Stairs            Wheelchair Mobility     Tilt Bed    Modified Rankin (Stroke Patients Only)       Balance Overall balance assessment: Needs assistance Sitting-balance support: Feet supported Sitting balance-[name] Scale: Fair Sitting balance - Comments: S for safety on EOB   Standing balance support: Single extremity supported, During functional activity Standing balance-[name] Scale: Poor Standing balance comment: relies on external support                             Pertinent Vitals/Pain Pain Assessment  Pain Assessment: No/denies pain Pain Intervention(s): Monitored during session    Home Living Family/patient expects to be discharged to:: Private residence Living Arrangements: Children Available Help at Discharge: Available 24 hours/day Type of Home: House Home Access: Ramped entrance       Home Layout: Two level;Able to live on main level with bedroom/bathroom Home Equipment: Shower seat;Grab bars - tub/shower;Hospital bed;Wheelchair  - manual;Hand held shower head;Other (comment) Additional Comments: uses Bil plaftorm RW at baseline; R metal upright AFO    Prior Function Prior Level of Function : Needs assist             Mobility Comments: Family provides physical assist with all gait, uses bil platform RW in the house with wheelchair follow. Per family, pt walks about 10 ft at a time ADLs Comments: Family assists with all ADLs; pt can feed and groom himself     Extremity/Trunk Assessment   Upper Extremity Assessment Upper Extremity Assessment: Defer to OT evaluation RUE Deficits / Details: hemiparesis from prior CVA, holds RUE in flexor synergy RUE Coordination: decreased fine motor;decreased gross motor    Lower Extremity Assessment Lower Extremity Assessment: RLE deficits/detail;Generalized weakness RLE Deficits / Details: hemiparesis from prior CVA, wears AFO during gait with ataxia noted RLE Coordination: decreased fine motor;decreased gross motor    Cervical / Trunk Assessment Cervical / Trunk Assessment: Kyphotic  Communication   Communication Communication: Impaired Factors Affecting Communication: Hearing impaired    Cognition Arousal: Alert Behavior During Therapy: Flat affect   PT - Cognitive impairments: History of cognitive impairments                         Following commands: Impaired Following commands impaired: Follows one step commands inconsistently, Follows one step commands with increased time     Cueing Cueing Techniques: Verbal cues, Tactile cues, Visual cues     General Comments General comments (skin integrity, edema, etc.): 2LO2 donned during session. redness noted around sacrum, RN informed and PT recommeded sacral foam    Exercises     Assessment/Plan    PT Assessment Patient needs continued PT services  PT Problem List Decreased strength;Decreased mobility;Decreased activity tolerance;Decreased balance;Decreased knowledge of use of DME;Decreased  safety awareness;Decreased knowledge of precautions;Pain;Decreased skin integrity       PT Treatment Interventions DME instruction;Therapeutic exercise;Gait training;Balance training;Functional mobility training;Therapeutic activities;Patient/family education;Stair training    PT Goals (Current goals can be found in the Care Plan section)  Acute Rehab PT Goals Patient Stated Goal: home PT Goal Formulation: With patient/family Time For Goal Achievement: 09/25/23 Potential to Achieve Goals: Fair    Frequency Min 2X/week     Co-evaluation               AM-PAC PT "6 Clicks" Mobility  Outcome Measure Help needed turning from your back to your side while in a flat bed without using bedrails?: A Little Help needed moving from lying on your back to sitting on the side of a flat bed without using bedrails?: A Lot Help needed moving to and from a bed to a chair (including a wheelchair)?: A Lot Help needed standing up from a chair using your arms (e.g., wheelchair or bedside chair)?: A Lot Help needed to walk in hospital room?: Total Help needed climbing 3-5 steps with a railing? : Total 6 Click Score: 11    End of Session Equipment Utilized During Treatment: Gait belt Activity Tolerance: Patient limited by fatigue Patient left: in bed;with  call bell/phone within reach;with family/visitor present;with bed alarm set Nurse Communication: Mobility status;Other (comment) (stedy for back to bed) PT Visit Diagnosis: Other abnormalities of gait and mobility (R26.89);Ataxic gait (R26.0)    Time: 1610-9604 PT Time Calculation (min) (ACUTE ONLY): 33 min   Charges:   PT Evaluation $PT Eval Low Complexity: 1 Low   PT General Charges $$ ACUTE PT VISIT: 1 Visit         Shirlene Doughty, PT DPT Acute Rehabilitation Services Secure Chat Preferred  Office [phone]   Kaytlin Burklow E Burnadette Carrion 09/11/2023, 11:57 AM

## 2023-09-11 NOTE — Evaluation (Signed)
 Occupational Therapy Evaluation Patient Details Name: Evan [name] MRN: [id number] DOB: Jan 28, 1928 Today's Date: 09/11/2023   History of Present Illness   89 yo male presents to [facility] on 6/7 with SOB. Admitted for possible aspiration pneumonia. PMH includes  dementia, CAD, CABG, HFpEF, CVA, PE on anticoagulation, lung cancer, hypertension, CKD 3B, COPD, BPH with chronic Foley. Recent admissions to [facility] from 5/11-19 for metapneumovirus pneumonia and heart failure exacerbation; 5/21-5/22 for AMS and R hand cellulitis.     Clinical Impressions PTA patient needing assist for ADLs and mobility from family, he has 24/7 support at home.  He currently requires mod assist +2 for bed mobility, min-max assist +2 for ADLs, mod assist +2 to stand and max assist +2 to step to recliner.  Family continues to report plan to take him home, initiated conversation to have additional assist in the home due to increased weakness. Pt on 2L during session via [place]. Will follow acutely with goal to dc home, but depending on progress we may need to think about short term rehab prior to dc home.       If plan is discharge home, recommend the following:   Two people to help with walking and/or transfers;Two people to help with bathing/dressing/bathroom;Assistance with cooking/housework;Direct supervision/assist for medications management;Direct supervision/assist for financial management;Assist for transportation;Help with stairs or ramp for entrance;Supervision due to cognitive status     Functional Status Assessment   Patient has had a recent decline in their functional status and demonstrates the ability to make significant improvements in function in a reasonable and predictable amount of time.     Equipment Recommendations   BSC/3in1     Recommendations for Other Services         Precautions/Restrictions   Precautions Precautions: Fall Recall of Precautions/Restrictions: Impaired Required Braces or  Orthoses: Other Brace Other Brace: R LE metal upright AFO from home Restrictions Weight Bearing Restrictions Per Provider Order: No     Mobility Bed Mobility Overal bed mobility: Needs Assistance Bed Mobility: Supine to Sit     Supine to sit: Mod assist, +2 for physical assistance     General bed mobility comments: increased cueing but mod assist to manage LEs and trunk to EOB    Transfers Overall transfer level: Needs assistance Equipment used: Rolling walker (2 wheels), 1 person hand held assist Transfers: Sit to/from Stand, Bed to chair/wheelchair/BSC Sit to Stand: +2 physical assistance, Mod assist     Step pivot transfers: Max assist, +2 physical assistance     General transfer comment: requires increased assist to stand and step to recliner, buckling when stepping away from bed.      Balance Overall balance assessment: Needs assistance Sitting-balance support: Feet supported Sitting balance-[name] Scale: Fair Sitting balance - Comments: S for safety on EOB   Standing balance support: Single extremity supported, During functional activity Standing balance-[name] Scale: Poor Standing balance comment: relies on external support                           ADL either performed or assessed with clinical judgement   ADL Overall ADL's : Needs assistance/impaired     Grooming: Minimal assistance;Sitting           Upper Body Dressing : Maximal assistance;Sitting   Lower Body Dressing: Total assistance;+2 for physical assistance;Sit to/from stand   Toilet Transfer: +2 for physical assistance;Maximal assistance   Toileting- Clothing Manipulation and Hygiene: Total assistance;+2 for physical assistance Toileting -  Clothing Manipulation Details (indicate cue type and reason): incontinent of bowel in standing     Functional mobility during ADLs: Maximal assistance;+2 for physical assistance       Vision   Additional Comments: not formally assessed      Perception         Praxis         Pertinent Vitals/Pain Pain Assessment Pain Assessment: No/denies pain Pain Intervention(s): Limited activity within patient's tolerance, Monitored during session, Repositioned     Extremity/Trunk Assessment Upper Extremity Assessment Upper Extremity Assessment: RUE deficits/detail;Generalized weakness RUE Deficits / Details: hemiparesis from prior CVA, holds RUE in flexor synergy RUE Coordination: decreased fine motor;decreased gross motor   Lower Extremity Assessment Lower Extremity Assessment: Defer to PT evaluation   Cervical / Trunk Assessment Cervical / Trunk Assessment: Kyphotic   Communication Communication Communication: Impaired Factors Affecting Communication: Hearing impaired   Cognition Arousal: Alert Behavior During Therapy: Flat affect Cognition: History of cognitive impairments             OT - Cognition Comments: dementia at baseline                 Following commands: Impaired Following commands impaired: Follows one step commands inconsistently, Follows one step commands with increased time     Cueing  General Comments   Cueing Techniques: Verbal cues;Tactile cues;Visual cues  pt on 2L [place] during session, daughter at side and supportive   Exercises     Shoulder Instructions      Home Living Family/patient expects to be discharged to:: Private residence Living Arrangements: Children Available Help at Discharge: Available 24 hours/day Type of Home: House Home Access: Ramped entrance     Home Layout: Two level;Able to live on main level with bedroom/bathroom     Bathroom Shower/Tub: Producer, television/film/video: Handicapped height     Home Equipment: Shower seat;Grab bars - tub/shower;Hospital bed;Wheelchair - manual;Hand held shower head;Other (comment)   Additional Comments: uses Bil plaftorm RW at baseline; R metal upright AFO      Prior Functioning/Environment Prior Level  of Function : Needs assist             Mobility Comments: Family provides physical assist with all gait, uses bil platform RW in the house with wheelchair follow. Per family, pt walks about 10 ft at a time ADLs Comments: Family assists with all ADLs; pt can feed and groom himself    OT Problem List: Decreased strength;Decreased activity tolerance;Impaired balance (sitting and/or standing);Decreased range of motion;Decreased coordination;Decreased cognition;Decreased safety awareness;Decreased knowledge of use of DME or AE;Decreased knowledge of precautions;Pain;Impaired UE functional use;Cardiopulmonary status limiting activity   OT Treatment/Interventions: Self-care/ADL training;Therapeutic exercise;DME and/or AE instruction;Therapeutic activities;Cognitive remediation/compensation;Patient/family education;Balance training      OT Goals(Current goals can be found in the care plan section)   Acute Rehab OT Goals Patient Stated Goal: get home OT Goal Formulation: With patient Time For Goal Achievement: 09/25/23 Potential to Achieve Goals: Fair   OT Frequency:  Min 2X/week    Co-evaluation              AM-PAC OT "6 Clicks" Daily Activity     Outcome Measure Help from another person eating meals?: A Little Help from another person taking care of personal grooming?: A Little Help from another person toileting, which includes using toliet, bedpan, or urinal?: Total Help from another person bathing (including washing, rinsing, drying)?: A Lot Help from another person to put on and taking  off regular upper body clothing?: A Lot Help from another person to put on and taking off regular lower body clothing?: Total 6 Click Score: 12   End of Session Equipment Utilized During Treatment: Gait belt;Rolling walker (2 wheels) Nurse Communication: Mobility status;Precautions;Need for lift equipment  Activity Tolerance: Patient tolerated treatment well Patient left: in chair;with call  bell/phone within reach;with family/visitor present  OT Visit Diagnosis: Other abnormalities of gait and mobility (R26.89);Muscle weakness (generalized) (M62.81);Other symptoms and signs involving the nervous system (R29.898);Other symptoms and signs involving cognitive function                Time: 6213-0865 OT Time Calculation (min): 33 min Charges:  OT General Charges $OT Visit: 1 Visit OT Evaluation $OT Eval Moderate Complexity: 1 Mod  Bary Boss, OT Acute Rehabilitation Services Office [phone] Secure Chat Preferred    Fredrich Jefferson 09/11/2023, 11:19 AM

## 2023-09-11 NOTE — Hospital Course (Signed)
 Evan [name]

## 2023-09-11 NOTE — Progress Notes (Signed)
 PROGRESS NOTE  TRES GRZYWACZ ZOX:[id number] DOB: 02/22/1928 DOA: 09/09/2023 PCP: Austine Lefort, MD   LOS: 1 day   Brief narrative:  Evan [name] is a 89 y.o. male with past medical history significant of dementia, CAD status post CABG, HFpEF, CVA, hypertension, hyperlipidemia, CKD stage IIIa, COPD, PVD, history of PE on Eliquis , lung cancer, anxiety, BPH with chronic Foley, depression, esophageal ulcer, GERD, hemochromatosis, rheumatoid arthritis, chronic low back pain, chronic anemia and thrombocytopenia with recent admission 5/11-5/19 for metapneumovirus pneumonia and heart failure exacerbation, admitted again 5/21-5/22 for altered mental status and right upper extremity/hand cellulitis.  Patient presented at this time to the ED with shortness of breath and feeling of not feeling well overall.  In the ED patient was given supplemental oxygen  and Foley catheter was exchanged and was out of the hospital for possible aspiration pneumonia.      Assessment/Plan: Principal Problem:   Aspiration pneumonia (HCC) Active Problems:   Hyperlipidemia, unspecified   Permanent atrial fibrillation (HCC)   Hypokalemia   Essential hypertension   Benign prostatic hyperplasia without lower urinary tract symptoms   COPD (chronic obstructive pulmonary disease) (HCC)   Dementia without behavioral disturbance (HCC)   Thrombocytopenia (HCC)   Hyponatremia   Acute hypoxemic respiratory failure (HCC)   Sepsis (HCC)     Acute hypoxemic respiratory failure secondary to pneumonia. Continue supplemental oxygen  wean as able.  PT OT evaluation pending.  Aspiration pneumonia  Continue aspiration precautions, continue Unasyn.  Speech therapy has seen the patient and recommended regular consistency diet.    Sepsis secondary to pneumonia. Follow-up blood cultures.  Patient is afebrile.  Leukocytosis improving.  Hyperlipidemia, unspecified Continue Crestor                 BPH Continue Flomax      Permanent A  fib  Continue metoprolol  and Eliquis      Essential hypertension Continue metoprolol    Thrombocytopenia No evidence of bleeding.  Will continue to monitor     Hypokalemia Potassium of 2.8 yesterday.  Will aggressively replace.  BMP from today pending.     Hyponatremia Improved after hydration.  Latest sodium of 139     COPD (chronic obstructive pulmonary disease) Continue DuoNebs     Dementia without behavioral disturbance  Continue supportive care.  Debility deconditioning will get PT OT evaluation   DVT prophylaxis: apixaban  (ELIQUIS ) tablet 2.5 mg Start: 09/09/23 2300 apixaban  (ELIQUIS ) tablet 2.5 mg   Disposition: Uncertain at this time will get PT OT evaluation.  Status is: Inpatient Remains inpatient appropriate because: Pending clinical improvement, IV antibiotic    Code Status:     Code Status: Limited: Do not attempt resuscitation (DNR) -DNR-LIMITED -Do Not Intubate/DNI   Family Communication: Spoke with the patient's daughter at bedside  Consultants: None  Procedures: None  Anti-infectives:  Unasyn  Anti-infectives (From admission, onward)    Start     Dose/Rate Route Frequency Ordered Stop   09/09/23 2300  Ampicillin-Sulbactam (UNASYN) 3 g in sodium chloride  0.9 % 100 mL IVPB        3 g 200 mL/hr over 30 Minutes Intravenous Every 6 hours 09/09/23 2259     09/09/23 2030  cefTRIAXone  (ROCEPHIN ) 1 g in sodium chloride  0.9 % 100 mL IVPB        1 g 200 mL/hr over 30 Minutes Intravenous  Once 09/09/23 2024 09/09/23 2155   09/09/23 2030  azithromycin  (ZITHROMAX ) 500 mg in sodium chloride  0.9 % 250 mL IVPB  500 mg 250 mL/hr over 60 Minutes Intravenous  Once 09/09/23 2024 09/09/23 2300        Subjective: Today, patient was seen and examined at bedside.  Patient states that she feels a little better with breathing today.  Hard of hearing.  Denies any pain, nausea, vomiting, fever or chills.  Patient daughter at bedside.  Objective: Vitals:    09/11/23 0820 09/11/23 0822  BP: 130/68 130/68  Pulse: 84 84  Resp:  16  Temp:  98.2 F (36.8 C)  SpO2:  96%    Intake/Output Summary (Last 24 hours) at 09/11/2023 1058 Last data filed at 09/11/2023 1030 Gross per 24 hour  Intake 1269.23 ml  Output 1125 ml  Net 144.23 ml   Filed Weights   09/09/23 1757  Weight: 58.5 kg   Body mass index is 23.59 kg/m.   Physical Exam:  GENERAL: Patient is alert awake and Communicative, hard of hearing not in obvious distress. HENT: No scleral pallor or icterus. Pupils equally reactive to light. Oral mucosa is moist NECK: is supple, no gross swelling noted. CHEST: Coarse breath sounds noted diminished breath sounds bilaterally. CVS: S1 and S2 heard, no murmur. Regular rate and rhythm.  ABDOMEN: Soft, non-tender, bowel sounds are present. EXTREMITIES: No edema. CNS: Cranial nerves are intact.  Moves all extremities SKIN: warm and dry without rashes.  Data Review: I have personally reviewed the following laboratory data and studies,  CBC: Recent Labs  Lab 09/09/23 1808 09/10/23 0224 09/11/23 0610  WBC 15.8* 11.6* 7.9  HGB 10.1* 9.4* 8.3*  HCT 29.6* 28.5* 24.8*  MCV 100.3* 103.6* 102.9*  PLT 82* 61* 58*   Basic Metabolic Panel: Recent Labs  Lab 09/09/23 1808 09/09/23 2100 09/10/23 0224 09/10/23 1022 09/10/23 1356 09/11/23 0610  NA 134*  --  136 131* 139 135  K 2.8*  --  2.7* 3.0* 2.8* 3.9  CL 101  --  105 104 107 109  CO2 21*  --  22 20* 19* 19*  GLUCOSE 101*  --  116* 156* 175* 114*  BUN 31*  --  26* 20 18 13   CREATININE 1.10  --  1.04 1.05 1.34* 0.94  CALCIUM  8.6*  --  8.0* 7.5* 7.2* 7.6*  MG  --  1.7  --   --   --   --    Liver Function Tests: Recent Labs  Lab 09/10/23 0224  AST 16  ALT 17  ALKPHOS 55  BILITOT 1.0  PROT 5.9*  ALBUMIN 2.6*   No results for input(s): "LIPASE", "AMYLASE" in the last 168 hours. No results for input(s): "AMMONIA" in the last 168 hours. Cardiac Enzymes: No results for input(s):  "CKTOTAL", "CKMB", "CKMBINDEX", "TROPONINI" in the last 168 hours. BNP (last 3 results) Recent Labs    08/10/23 1633 08/13/23 1020 09/10/23 0224  BNP 802* 587.6* 927.6*    ProBNP (last 3 results) No results for input(s): "PROBNP" in the last 8760 hours.  CBG: No results for input(s): "GLUCAP" in the last 168 hours. Recent Results (from the past 240 hours)  SARS Coronavirus 2 by RT PCR (hospital order, performed in [facility] hospital lab) *cepheid single result test* Anterior Nasal Swab     Status: None   Collection Time: 09/10/23  4:53 AM   Specimen: Anterior Nasal Swab  Result Value Ref Range Status   SARS Coronavirus 2 by RT PCR NEGATIVE NEGATIVE Final    Comment: Performed at [facility] Lab, [address], [place],  [place] [address]  Respiratory (~20 pathogens) panel by PCR     Status: None   Collection Time: 09/10/23  4:53 AM   Specimen: Anterior Nasal Swab; Respiratory  Result Value Ref Range Status   Adenovirus NOT DETECTED NOT DETECTED Final   Coronavirus 229E NOT DETECTED NOT DETECTED Final    Comment: (NOTE) The Coronavirus on the Respiratory Panel, DOES NOT test for the novel  Coronavirus (2019 nCoV)    Coronavirus HKU1 NOT DETECTED NOT DETECTED Final   Coronavirus NL63 NOT DETECTED NOT DETECTED Final   Coronavirus OC43 NOT DETECTED NOT DETECTED Final   Metapneumovirus NOT DETECTED NOT DETECTED Final   Rhinovirus / Enterovirus NOT DETECTED NOT DETECTED Final   Influenza A NOT DETECTED NOT DETECTED Final   Influenza B NOT DETECTED NOT DETECTED Final   Parainfluenza Virus 1 NOT DETECTED NOT DETECTED Final   Parainfluenza Virus 2 NOT DETECTED NOT DETECTED Final   Parainfluenza Virus 3 NOT DETECTED NOT DETECTED Final   Parainfluenza Virus 4 NOT DETECTED NOT DETECTED Final   Respiratory Syncytial Virus NOT DETECTED NOT DETECTED Final   Bordetella pertussis NOT DETECTED NOT DETECTED Final   Bordetella Parapertussis NOT DETECTED NOT DETECTED Final    Chlamydophila pneumoniae NOT DETECTED NOT DETECTED Final   Mycoplasma pneumoniae NOT DETECTED NOT DETECTED Final    Comment: Performed at [facility], [address], [place], [place] [address]     Studies: DG Swallowing Func-Speech Pathology Result Date: 09/10/2023 Table formatting from the original result was not included. Modified Barium Swallow Study Patient Details Name: PELHAM HENNICK MRN: [id number] Date of Birth: Mar 27, 1928 Today's Date: 09/10/2023 HPI/PMH: HPI: JISHNU JENNIGES is a 89 y.o. male with medical history significant of dementia, CAD status post CABG, HFpEF, CVA, hypertension, hyperlipidemia, CKD stage IIIa, COPD, PVD, history of PE on Eliquis , lung cancer, anxiety, BPH with chronic Foley, depression, esophageal ulcer, GERD, hemochromatosis, rheumatoid arthritis, chronic low back pain, chronic anemia and thrombocytopenia.  Recent admission 5/11-5/19 for metapneumovirus pneumonia and heart failure exacerbation.  Admitted again 5/21-5/22 for altered mental status and right upper extremity/hand cellulitis, chest congestion, SOB. Dx with acute hypoxemic respiratory failure secondary to aspiration PNA. Patient with h/o dysphagia, multiple MBS since 2020. Study in 2020 and 2024 recommending either nectar thick or thin liquids,  study complete 08/2023 recommending regular solids with nectar thick liquids due to aspiration of thin liquids. Clinical Impression: Clinical Impression: Patient presents with an age appropriate, functional oropharyngeal swallow based on today's assessment. Oral phase appropriate with timely mastication and full oral clearance. Swallow initiated at the level of the vallecula with all boluses with the acception of thin liquids which were intermittently initiated at the pyriform sinuses resulting in flash penetration of thin liquids which is considered normal for age. No aspiration observed, even when challenged with large consecutive boluses via straw. Most significant finding  was notable stasis througout esophagus, only post intake of previously noted large serial swallows of thin liquid. Liquid did not clear independently over approximately 60 seconds. Given bite of pureed solid which quickly pushed entire contents of esophagus into the stomach. Subseqent single small sips of thin liquid passed quickly. 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 Defer further w/u to MD. Education complete with son who was present for the study. Suspect that this patient, with advanced age, h/o CVA, and fluctuating dysphagia documented for at least 5 years, will continue to have fluctuating swallowing abilities with chronic risk of aspiration, exacerbated by suspected esophageal deficits. Recommending thin  liquids at this time. SLP will f/u to reinforce use of precautions. Factors that may increase risk of adverse event in presence of aspiration Roderick Civatte & Jessy Morocco 2021): Factors that may increase risk of adverse event in presence of aspiration Roderick Civatte & Jessy Morocco 2021): Respiratory or GI disease; Limited mobility Recommendations/Plan: Swallowing Evaluation Recommendations Swallowing Evaluation Recommendations Recommendations: PO diet PO Diet Recommendation: Regular; Thin liquids (Level 0) Liquid Administration via: Cup; Straw Medication Administration: Whole meds with puree Supervision: Patient able to self-feed; Full supervision/cueing for swallowing strategies Swallowing strategies  : Slow rate; Small bites/sips (single sips) Postural changes: Stay upright 30-60 min after meals; Position pt fully upright for meals Oral care recommendations: Oral care BID (2x/day) Recommended consults: Consider esophageal assessment Treatment Plan Treatment Plan Treatment recommendations: Therapy as outlined in treatment plan below Follow-up recommendations: Home health SLP Functional status assessment: Patient has had a recent decline in their functional status and demonstrates the ability to make significant  improvements in function in a reasonable and predictable amount of time. Treatment frequency: Min 1x/week Treatment duration: 1 week Interventions: Aspiration precaution training; Compensatory techniques; Patient/family education; Diet toleration management by SLP Recommendations Recommendations for follow up therapy are one component of a multi-disciplinary discharge planning process, led by the attending physician.  Recommendations may be updated based on patient status, additional functional criteria and insurance authorization. Assessment: Orofacial Exam: Orofacial Exam Oral Cavity: Oral Hygiene: WFL Oral Cavity - Dentition: Adequate natural dentition Orofacial Anatomy: WFL Oral Motor/Sensory Function: Suspected cranial nerve impairment CN VII - Facial: Right motor impairment (baseline) Anatomy: Anatomy: Other (Comment) (what appears to be calcification along posterior pharyhngeal wall, above UES again noted, unchanged from previous studies) Boluses Administered: Boluses Administered Boluses Administered: Thin liquids (Level 0); Mildly thick liquids (Level 2, nectar thick); Puree; Solid  Oral Impairment Domain: Oral Impairment Domain Lip Closure: Escape beyond mid-chin Tongue control during bolus hold: Cohesive bolus between tongue to palatal seal Bolus preparation/mastication: Slow prolonged chewing/mashing with complete recollection Bolus transport/lingual motion: Brisk tongue motion Initiation of pharyngeal swallow : Pyriform sinuses  Pharyngeal Impairment Domain: Pharyngeal Impairment Domain Soft palate elevation: No bolus between soft palate (SP)/pharyngeal wall (PW) Laryngeal elevation: Complete superior movement of thyroid  cartilage with complete approximation of arytenoids to epiglottic petiole Anterior hyoid excursion: Complete anterior movement Epiglottic movement: Complete inversion Laryngeal vestibule closure: Incomplete, narrow column air/contrast in laryngeal vestibule Pharyngeal stripping wave :  Present - complete Pharyngeal contraction (A/P view only): N/A Pharyngoesophageal segment opening: Complete distension and complete duration, no obstruction of flow Tongue base retraction: No contrast between tongue base and posterior pharyngeal wall (PPW) Pharyngeal residue: Complete pharyngeal clearance  Esophageal Impairment Domain: Esophageal Impairment Domain Esophageal clearance upright position: Esophageal retention Pill: Pill Consistency administered: Mildly thick liquids (Level 2, nectar thick) Mildly thick liquids (Level 2, nectar thick): WFL Penetration/Aspiration Scale Score: Penetration/Aspiration Scale Score 1.  Material does not enter airway: Mildly thick liquids (Level 2, nectar thick); Puree; Solid; Pill 2.  Material enters airway, remains ABOVE vocal cords then ejected out: Thin liquids (Level 0) Compensatory Strategies: No data recorded  General Information: Caregiver present: Yes  Diet Prior to this Study: NPO   Temperature : Normal   Respiratory Status: WFL   Supplemental O2: Nasal cannula   History of Recent Intubation: No  Behavior/Cognition: Alert; Cooperative; Pleasant mood; Requires cueing Self-Feeding Abilities: Able to self-feed Baseline vocal quality/speech: Dysphonic Volitional Cough: Able to elicit Volitional Swallow: Able to elicit No data recorded Goal Planning: Prognosis for improved oropharyngeal function: Good No  data recorded No data recorded No data recorded Consulted and agree with results and recommendations: Patient; Family member/caregiver Pain: Pain Assessment Pain Assessment: No/denies pain End of Session: Start Time:SLP Start Time (ACUTE ONLY): 0935 Stop Time: SLP Stop Time (ACUTE ONLY): 0955 Time Calculation:SLP Time Calculation (min) (ACUTE ONLY): 20 min Charges: SLP Evaluations $ SLP Speech Visit: 1 Visit SLP Evaluations $BSS Swallow: 1 Procedure $MBS Swallow: 1 Procedure SLP visit diagnosis: SLP Visit Diagnosis: Dysphagia, oropharyngeal phase (R13.12) Past Medical  History: Past Medical History: Diagnosis Date  Acute blood loss anemia   Acute bronchitis 05/15/2017  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 07/14/2018  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  Altered mental status 08/21/2018  Anginal chest pain at rest [facility])   Chronic non, controlled on Lorazepam   Anxiety   Arthritis   "all over"   BPH (benign prostatic hyperplasia)   CAD (coronary artery disease)   Cellulitis of arm, right 06/01/2018  Cellulitis of left axilla   Chronic lower back pain   Complication of anesthesia   "problems making water afterwards"  Depression   DJD (degenerative joint disease)   Esophageal ulcer without bleeding   bid ppi indefinitely  Fall 07/16/2018  Family history of adverse reaction to anesthesia   "children w/PONV"  GERD (gastroesophageal reflux disease)   Haemophilus influenzae septicemia (HCC) 03/12/2023  Headache   "couple/week maybe" (09/04/2015)  Hemochromatosis   Possible, elevated iron  stores, hook-like osteophytes on hand films, normal LFTs  Hepatitis   "yellow jaundice as a baby"  History of ASCVD   MULTIVESSEL  Hyperlipidemia   Hypertension   Hypotension   Mild aortic stenosis 06/23/2021  Osteoarthritis   Peripheral vascular disease (HCC)   Pneumonia   "several times; got a little now" (09/04/2015)  Pulmonary embolism (HCC) 02/02/2018  Rash and nonspecific skin eruption 06/01/2018  Rheumatoid arthritis (HCC)   RSV (respiratory syncytial virus pneumonia) 05/15/2022  Subdural hematoma (HCC)   small after fall 09/2016-plavix  held, neurosurgery consulted.  Asymptomatic.    Thromboembolism (HCC) 02/2018  on Lovenox  lifelong since he failed PO Eliquis  Past Surgical History: Past Surgical History: Procedure Laterality Date  ARM SKIN LESION BIOPSY / EXCISION Left 09/02/2015  CATARACT EXTRACTION W/ INTRAOCULAR LENS  IMPLANT, BILATERAL Bilateral   CHOLECYSTECTOMY OPEN    CORNEAL TRANSPLANT Bilateral   "one at [facility]; one at  [facility]"  CORONARY ANGIOPLASTY WITH STENT PLACEMENT    CORONARY ARTERY BYPASS GRAFT  1999  DESCENDING AORTIC ANEURYSM REPAIR W/ STENT    DE stent ostium into the right radial free graft at OM-, 02-2007  ESOPHAGOGASTRODUODENOSCOPY N/A 11/09/2014  Procedure: ESOPHAGOGASTRODUODENOSCOPY (EGD);  Surgeon: Delilah Fend, MD;  Location: [facility] ENDOSCOPY;  Service: Endoscopy;  Laterality: N/A;  INGUINAL HERNIA REPAIR    JOINT REPLACEMENT    LEFT HEART CATH AND CORS/GRAFTS ANGIOGRAPHY N/A 06/27/2017  Procedure: LEFT HEART CATH AND CORS/GRAFTS ANGIOGRAPHY;  Surgeon: Millicent Ally, MD;  Location: [facility] INVASIVE CV LAB;  Service: Cardiovascular;  Laterality: N/A;  NASAL SINUS SURGERY    SHOULDER OPEN ROTATOR CUFF REPAIR Right   TOTAL KNEE ARTHROPLASTY Bilateral  Delsa Fife MA, CCC-SLP s Delsa Fife Meryl 09/10/2023, 10:32 AM  DG Chest 1 View Result Date: 09/10/2023 CLINICAL DATA:  Shortness of breath. EXAM: CHEST  1 VIEW COMPARISON:  Chest radiograph dated 08/23/2023. FINDINGS: There is diffuse chronic intra coarsening. Right lung base atelectasis/scarring. Pneumonia is not excluded. No large pleural effusion. No pneumothorax. Stable cardiac silhouette. Median sternotomy wires. No acute osseous  pathology. IMPRESSION: Right lung base atelectasis/scarring. Pneumonia is not excluded. Electronically Signed   By: Angus Bark M.D.   On: 09/10/2023 09:44   CT Chest Wo Contrast Result Date: 09/09/2023 CLINICAL DATA:  Pneumonia, complication suspected, xray done outpt xray - concern for PNA EXAM: CT CHEST WITHOUT CONTRAST TECHNIQUE: Multidetector CT imaging of the chest was performed following the standard protocol without IV contrast. RADIATION DOSE REDUCTION: This exam was performed according to the departmental dose-optimization program which includes automated exposure control, adjustment of the mA and/or kV according to patient size and/or use of iterative reconstruction technique. COMPARISON:  08/23/2023, 03/13/2023 FINDINGS:  Cardiovascular: Unenhanced imaging of the heart demonstrates cardiomegaly without pericardial effusion. Prominent left atrial dilatation. Postsurgical changes from prior CABG. Normal caliber of the thoracic aorta. Atherosclerosis of the aorta and coronary vasculature. Assessment of the vascular lumen cannot be performed without intravenous contrast. Mediastinum/Nodes: Borderline enlarged mediastinal adenopathy, measuring up to 10 mm in the right paratracheal station. Thyroid , trachea, and esophagus are unremarkable. Lungs/Pleura: Diffuse subpleural interlobular septal thickening consistent with chronic scarring. There is superimposed dense airspace disease within the dependent right lower lobe consistent with aspiration or infection. Trace right pleural effusion. No pneumothorax. Central airways are patent. Upper Abdomen: No acute abnormality. Musculoskeletal: No acute or destructive bony abnormalities. Reconstructed images demonstrate no additional findings. IMPRESSION: 1. Dense right basilar consolidation consistent with infection or aspiration. 2. Trace right pleural effusion. 3. Basilar predominant subpleural scarring and fibrosis. 4.  Aortic Atherosclerosis (ICD10-I70.0). 5. Cardiomegaly, with diffuse coronary artery atherosclerosis. Electronically Signed   By: Bobbye Burrow M.D.   On: 09/09/2023 19:53      Rosena Conradi, MD  [facility] 09/11/2023  If 7PM-7AM, please contact night-coverage

## 2023-09-11 NOTE — TOC Initial Note (Addendum)
 Transition of Care [facility]) - Initial/Assessment Note    Patient Details  Name: Evan [name] MRN: [id number] Date of Birth: Sep 17, 1927  Transition of Care [facility]) CM/SW Contact:    Terre Ferri, RN Phone Number: 09/11/2023, 1:43 PM  Clinical Narrative:                  Spoke to patient's son B.Alyn Bevin Bucks at bedside. Son wants to take his dad home at discharge. They currently have [facility] and want to continue services. Confirmed with Randel Buss with [employer]. Entered orders and face to face for MD to sign.   Bedside commode recommendation. Patient already has one at home.   Son requesting new wheelchair. Son states it has been greater than 5 years since he got his current one. NCM entered order and note for MD to sign.   NCM asked [employer] to call son prior to bringing wheelchair to room. Son wants to know if there is a co pay before accepting  Son plans to take patient home in car at discharge . Offered ambulance son declined   Received a message from Zachary with [employer]. [employer] reached out to son regarding wheel chair. Son told [employer] he wants to talk to his father's insurance company first  Expected Discharge Plan: Home w Home Health Services Barriers to Discharge: Continued Medical Work up   Patient Goals and CMS Choice Patient states their goals for this hospitalization and ongoing recovery are:: patient asleep , son B Jaycub Noorani wants to tajke home at discharge with [facility]   Choice offered to / list presented to : Adult Children [place] ownership interest in [facility].provided to:: Adult Children    Expected Discharge Plan and Services   Discharge Planning Services: CM Consult Post Acute Care Choice: Home Health Living arrangements for the past 2 months: Single Family Home                 DME Arranged: Wheelchair manual DME Agency: [employer] Date DME Agency Contacted: 09/11/23 Time DME Agency Contacted: 1335 Representative spoke with at DME Agency: [facility]  Arranged: RN, PT, OT HH Agency: [facility] Date [facility] Agency Contacted: 09/11/23 Time HH Agency Contacted: 1336 Representative spoke with at [facility] Agency: Randel Buss  Prior Living Arrangements/Services Living arrangements for the past 2 months: Single Family Home Lives with:: Adult Children Patient language and need for interpreter reviewed:: Yes Do you feel safe going back to the place where you live?: Yes      Need for Family Participation in Patient Care: Yes (Comment) Care giver support system in place?: Yes (comment) Current home services: DME Criminal Activity/Legal Involvement Pertinent to Current Situation/Hospitalization: No - Comment as needed  Activities of Daily Living   ADL Screening (condition at time of admission) Independently performs ADLs?: No Does the patient have a NEW difficulty with bathing/dressing/toileting/self-feeding that is expected to last >3 days?: Yes (Initiates electronic notice to provider for possible OT consult) Does the patient have a NEW difficulty with getting in/out of bed, walking, or climbing stairs that is expected to last >3 days?: Yes (Initiates electronic notice to provider for possible PT consult) Does the patient have a NEW difficulty with communication that is expected to last >3 days?: No Is the patient deaf or have difficulty hearing?: Yes Does the patient have difficulty seeing, even when wearing glasses/contacts?: No Does the patient have difficulty concentrating, remembering, or making decisions?: No  Permission Sought/Granted   Permission granted to  share information with : Yes, Verbal Permission Granted  Share Information with NAME: Danyael Alipio [phone]  Permission granted to share info w AGENCY: [employer] [facility] , [employer]        Emotional Assessment Appearance:: Appears stated age Attitude/Demeanor/Rapport: Engaged Affect (typically observed): Appropriate Orientation: : Oriented to Self, Oriented to Place, Oriented to  Time,  Oriented to Situation Alcohol  / Substance Use: Not Applicable Psych Involvement: No (comment)  Admission diagnosis:  Hypokalemia [E87.6] Aspiration pneumonia (HCC) [J69.0] Pneumonia of right lower lobe due to infectious organism [J18.9] Patient Active Problem List   Diagnosis Date Noted   Sepsis (HCC) 09/10/2023   Aspiration pneumonia (HCC) 09/09/2023   Hypovolemia 08/24/2023   Cellulitis of upper extremity 08/23/2023   Bilateral pneumonia 08/17/2023   Acute hypoxemic respiratory failure (HCC) 08/13/2023   Chronic systolic heart failure (HCC) 08/10/2023   Acute metabolic acidosis 03/13/2023   Chronic indwelling Foley catheter 03/12/2023   Anemia 09/27/2022   Gastro-esophageal reflux disease without esophagitis 05/17/2022   Hyperlipidemia, unspecified 05/17/2022   Personal history of other venous thrombosis and embolism 05/17/2022   CKD stage 3b, GFR 30-44 ml/min (HCC) - baseline SCr 1.2-1.5 05/17/2022   Presence of coronary angioplasty implant and graft 05/17/2022   Malignant neoplasm of unspecified part of right bronchus or lung (HCC) 05/17/2022   Dementia without behavioral disturbance (HCC) 05/17/2022   Acute on chronic diastolic CHF (congestive heart failure) (HCC) 05/12/2022   Protein-calorie malnutrition, severe 05/12/2022   Chronic respiratory failure, unsp w hypoxia or hypercapnia (HCC) 04/28/2022   History of pulmonary embolism 04/28/2022   Hemiparesis due to old stroke (HCC) 06/25/2021   Mild aortic stenosis 06/23/2021   Sundowning 07/04/2019   Bilateral hearing loss 04/16/2019   Closed wedge fracture of lumbar vertebra (HCC) 03/14/2019   Pressure ulcer of back 08/27/2018   COPD (chronic obstructive pulmonary disease) (HCC) 08/26/2018   Prolonged Q-T interval on ECG    Dysphagia, post-stroke    Hyponatremia    History of lung cancer    Depression    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   Thrombocytopenia (HCC) 02/02/2018    Hypokalemia 02/02/2018   Old MI (myocardial infarction) 06/27/2017   HFmrEF (heart failure with mildly reduced ejection fraction) (HCC) 06/26/2017   Oral candidiasis 05/15/2017   Radiation pneumonitis (HCC) 11/09/2016   Rheumatoid arthritis, unspecified (HCC) 10/11/2016   Subdural hematoma (HCC) 09/13/2016   Permanent atrial fibrillation (HCC) 05/21/2016   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   GAD (generalized anxiety disorder) 11/20/2015   Esophageal ulcer without bleeding    Bilateral lower extremity edema 04/28/2014   CAD (coronary artery disease) 09/09/2013   Benign prostatic hyperplasia without lower urinary tract symptoms 10/08/2012   EKG abnormalities 09/05/2012   Tremor 09/05/2012   Chronic fatigue 09/05/2012   Arthritis    Asthma, chronic    Reflux esophagitis    S/P CABG (coronary artery bypass graft)    Essential hypertension    UNSPECIFIED PERIPHERAL VASCULAR DISEASE 06/16/2009   PCP:  Austine Lefort, MD Pharmacy:   [facility] - [place], [place] - [address] [place] [address] Phone: [phone] Fax: [phone]  [place] [facility] [address] [place] [address] Phone: [phone] Fax: [phone]  [id number] - [place], [place] - [address] AT [facility] [address] [place] [address] Phone: [phone] Fax: [phone]  [facility] [address] [place] [address] Phone: [phone] Fax: [phone]     Social Drivers of Health (SDOH) Social History: SDOH Screenings   Food Insecurity: No Food Insecurity (09/10/2023)  Housing: Low Risk  (09/10/2023)  Transportation Needs: No Transportation Needs (09/10/2023)  Utilities: Not At Risk (09/10/2023)  Alcohol  Screen: Low Risk  (04/20/2023)  Depression (PHQ2-9): High Risk (07/10/2023)  Financial Resource Strain: Low Risk  (04/20/2023)  Physical  Activity: Insufficiently Active (04/20/2023)  Social Connections: Socially Isolated (09/10/2023)  Stress: No Stress Concern Present (04/20/2023)  Tobacco Use: Medium Risk (09/09/2023)  Health Literacy: Inadequate Health Literacy (04/20/2023)   SDOH Interventions:     Readmission Risk Interventions    08/15/2023    4:46 PM 03/10/2023    2:24 PM 05/17/2022    1:25 PM  Readmission Risk Prevention Plan  Transportation Screening Complete Complete Complete  PCP or Specialist Appt within 3-5 Days Complete    HRI or Home Care Consult Complete    Palliative Care Screening Not Applicable    Medication Review (RN Care Manager) Complete Complete Complete  PCP or Specialist appointment within 3-5 days of discharge  Complete Complete  HRI or Home Care Consult  Complete Complete  SW Recovery Care/Counseling Consult  Complete   Palliative Care Screening  Not Applicable   Skilled Nursing Facility  Not Applicable

## 2023-09-12 ENCOUNTER — Other Ambulatory Visit: Payer: Self-pay | Admitting: Licensed Clinical Social Worker

## 2023-09-12 DIAGNOSIS — J690 Pneumonitis due to inhalation of food and vomit: Secondary | ICD-10-CM | POA: Diagnosis not present

## 2023-09-12 LAB — BASIC METABOLIC PANEL WITH GFR
Anion gap: 8 (ref 5–15)
BUN: 11 mg/dL (ref 8–23)
CO2: 20 mmol/L — ABNORMAL LOW (ref 22–32)
Calcium: 7.7 mg/dL — ABNORMAL LOW (ref 8.9–10.3)
Chloride: 106 mmol/L (ref 98–111)
Creatinine, Ser: 0.9 mg/dL (ref 0.61–1.24)
GFR, Estimated: >60 mL/min (ref >60)
Glucose, Bld: 99 mg/dL (ref 70–99)
Potassium: 3.9 mmol/L (ref 3.5–5.1)
Sodium: 134 mmol/L — ABNORMAL LOW (ref 135–145)

## 2023-09-12 LAB — MAGNESIUM: Magnesium: 1.6 mg/dL — ABNORMAL LOW (ref 1.7–2.4)

## 2023-09-12 MED ORDER — GUAIFENESIN-DM 100-10 MG/5ML PO SYRP
5.0000 mL | ORAL_SOLUTION | ORAL | Status: DC | PRN
  Administered 2023-09-12 – 2023-09-13 (×2): 5 mL via ORAL
  Filled 2023-09-12 (×2): qty 5

## 2023-09-12 MED ORDER — HYDROXYZINE HCL 10 MG PO TABS
10.0000 mg | ORAL_TABLET | Freq: Once | ORAL | Status: AC | PRN
Start: 2023-09-12 — End: 2023-09-12
  Administered 2023-09-12: 10 mg via ORAL
  Filled 2023-09-12: qty 1

## 2023-09-12 MED ORDER — SODIUM CHLORIDE 0.9 % IV SOLN
2.0000 g | Freq: Two times a day (BID) | INTRAVENOUS | Status: DC
  Administered 2023-09-12 – 2023-09-14 (×4): 2 g via INTRAVENOUS
  Filled 2023-09-12 (×4): qty 12.5

## 2023-09-12 MED ORDER — METRONIDAZOLE 500 MG/100ML IV SOLN
500.0000 mg | Freq: Two times a day (BID) | INTRAVENOUS | Status: DC
  Administered 2023-09-12 – 2023-09-15 (×6): 500 mg via INTRAVENOUS
  Filled 2023-09-12 (×6): qty 100

## 2023-09-12 NOTE — Progress Notes (Signed)
 Physical Therapy Treatment Patient Details Name: Evan [name] MRN: [id number] DOB: 02-Oct-1927 Today's Date: 09/12/2023   History of Present Illness 89 yo male presents to [facility] on 6/7 with SOB. Admitted for possible aspiration pneumonia. PMH includes  dementia, CAD, CABG, HFpEF, CVA, PE on anticoagulation, lung cancer, hypertension, CKD 3B, COPD, BPH with chronic Foley. Recent admissions to [facility] from 5/11-19 for metapneumovirus pneumonia and heart failure exacerbation; 5/21-5/22 for AMS and R hand cellulitis.    PT Comments  Pt states he feels "fair", not on O2 at this time and SpO2 maintained 92-94% on RA during activity. Pt tolerating repeated transfers into standing this date, pivoted to/from recliner multiple times as well due to bowel incontinence. Pt overall requiring mod +2 physical assist for mobility at this time, pt's daughter present during session and states she knows he is far from baseline, but family still wants to take him home and they can provide needed assist. PT encouraged transfer-level only mobility until pt strength improves, pt's daughter expresses understanding.     If plan is discharge home, recommend the following: A lot of help with walking and/or transfers;A lot of help with bathing/dressing/bathroom;Assist for transportation;Help with stairs or ramp for entrance   Can travel by private vehicle        Equipment Recommendations  Wheelchair (measurements PT);Wheelchair cushion (measurements PT)    Recommendations for Other Services       Precautions / Restrictions Precautions Precautions: Fall Recall of Precautions/Restrictions: Impaired Required Braces or Orthoses: Other Brace Other Brace: R LE metal upright AFO from home Restrictions Weight Bearing Restrictions Per Provider Order: No     Mobility  Bed Mobility Overal bed mobility: Needs Assistance Bed Mobility: Supine to Sit     Supine to sit: Mod assist, +2 for physical assistance     General bed  mobility comments: assist for trunk elevation, LE progression, and scooting hips forward with bed pad.    Transfers Overall transfer level: Needs assistance Equipment used: Rolling walker (2 wheels), 1 person hand held assist Transfers: Sit to/from Stand, Bed to chair/wheelchair/BSC Sit to Stand: +2 physical assistance, Mod assist   Step pivot transfers: Mod assist, +2 physical assistance       General transfer comment: assist for power up, rise, steady, and pivotal steps towards recliner, then BSC, then back to recliner. Sit<>stand x4 total throughout, standing tolerance x1 minute before fatiguing. Pt with stool during 2nd stand, requiring multiple additional stands for trip to [facility] and clean up.    Ambulation/Gait               General Gait Details: unable   Stairs             Wheelchair Mobility     Tilt Bed    Modified Rankin (Stroke Patients Only)       Balance Overall balance assessment: Needs assistance Sitting-balance support: Feet supported Sitting balance-[name] Scale: Fair Sitting balance - Comments: S for safety on EOB   Standing balance support: During functional activity, Reliant on assistive device for balance, Bilateral upper extremity supported Standing balance-[name] Scale: Poor Standing balance comment: relies on external support                            Communication Communication Communication: Impaired Factors Affecting Communication: Hearing impaired  Cognition Arousal: Alert Behavior During Therapy: Flat affect   PT - Cognitive impairments: History of cognitive impairments  Following commands: Impaired Following commands impaired: Follows one step commands inconsistently, Follows one step commands with increased time    Cueing Cueing Techniques: Verbal cues, Tactile cues, Visual cues  Exercises General Exercises - Lower Extremity Long Arc Quad: AROM, Left, 20 reps, Seated     General Comments General comments (skin integrity, edema, etc.): VSS on RA, SPO2 92-94% with mobility on RA. Pt noted to have old skin tear on R knee, min bleeding from this site. RN notified, PT placed bandaid. sacral pad removed as soiled in stool      Pertinent Vitals/Pain Pain Assessment Pain Assessment: No/denies pain Pain Intervention(s): Limited activity within patient's tolerance, Monitored during session    Home Living                          Prior Function            PT Goals (current goals can now be found in the care plan section) Acute Rehab PT Goals Patient Stated Goal: home PT Goal Formulation: With patient/family Time For Goal Achievement: 09/25/23 Potential to Achieve Goals: Fair Progress towards PT goals: Progressing toward goals    Frequency    Min 2X/week      PT Plan      Co-evaluation              AM-PAC PT "6 Clicks" Mobility   Outcome Measure  Help needed turning from your back to your side while in a flat bed without using bedrails?: A Little Help needed moving from lying on your back to sitting on the side of a flat bed without using bedrails?: A Lot Help needed moving to and from a bed to a chair (including a wheelchair)?: A Lot Help needed standing up from a chair using your arms (e.g., wheelchair or bedside chair)?: A Lot Help needed to walk in hospital room?: Total Help needed climbing 3-5 steps with a railing? : Total 6 Click Score: 11    End of Session Equipment Utilized During Treatment: Gait belt Activity Tolerance: Patient limited by fatigue Patient left: with call bell/phone within reach;with family/visitor present;in chair;Other (comment) (pt up in chair without chair alarm, as pt's daughter at bedside and pt has family at bedside at all times. Pt's daughter declines need for chair alarm.) Nurse Communication: Mobility status (stedy for back to bed) PT Visit Diagnosis: Other abnormalities of gait and mobility  (R26.89);Ataxic gait (R26.0)     Time: 6045-4098 PT Time Calculation (min) (ACUTE ONLY): 30 min  Charges:    $Therapeutic Activity: 23-37 mins PT General Charges $$ ACUTE PT VISIT: 1 Visit                     Shirlene Doughty, PT DPT Acute Rehabilitation Services Secure Chat Preferred  Office [phone]    Hosam Mcfetridge Cydney Draft 09/12/2023, 12:42 PM

## 2023-09-12 NOTE — Progress Notes (Addendum)
 PROGRESS NOTE  Evan [name]:[id number] DOB: 01/06/1928 DOA: 09/09/2023 PCP: Austine Lefort, MD   LOS: 2 days   Brief narrative:  Evan [name] is a 89 y.o. male with past medical history significant of dementia, CAD status post CABG, HFpEF, CVA, hypertension, hyperlipidemia, CKD stage IIIa, COPD, PVD, history of PE on Eliquis , lung cancer, anxiety, BPH with chronic Foley, depression, esophageal ulcer, GERD, hemochromatosis, rheumatoid arthritis, chronic low back pain, chronic anemia and thrombocytopenia with recent admission 5/11-5/19 for metapneumovirus pneumonia and heart failure exacerbation, admitted again 5/21-5/22 for altered mental status and right upper extremity/hand cellulitis.  Patient presented at this time to the ED with shortness of breath and feeling of not feeling well overall.  In the ED patient was given supplemental oxygen  and Foley catheter was exchanged and was out of the hospital for possible aspiration pneumonia.     Assessment/Plan: Principal Problem:   Aspiration pneumonia (HCC) Active Problems:   Hyperlipidemia, unspecified   Permanent atrial fibrillation (HCC)   Hypokalemia   Essential hypertension   Benign prostatic hyperplasia without lower urinary tract symptoms   COPD (chronic obstructive pulmonary disease) (HCC)   Dementia without behavioral disturbance (HCC)   Thrombocytopenia (HCC)   Hyponatremia   Acute hypoxemic respiratory failure (HCC)   Sepsis (HCC)     Acute hypoxemic respiratory failure secondary to pneumonia. Continue supplemental oxygen  wean as able.  PT OT evaluation recommend home health on discharge.  Continue IV antibiotic.  Aspiration precautions.  Seen by speech therapy and okay for regular diet but will need precautions.  Pseudomonas UTI more than 100,000 colony in urine culture.  Currently on Unasyn.  Will follow sensitivity.  Communicated with pharmacy and at this time plan is to change to cefepime  and Flagyl .  Will try to avoid  Zosyn  due to concerns for renal toxicity.  Aspiration pneumonia  continue Unasyn.  Can change to oral on discharge.  On oxygen  which we will try to wean if possible.  Speech therapy has seen the patient and recommended regular consistency diet.  Continue aspiration precautions.  Will try to wean oxygen  today.    Sepsis secondary to pneumonia and possible UTI.Evan [name]  Patient is afebrile.  Leukocytosis improving.  Check CBC in AM.  Hypomagnesemia.  Will replace with IV magnesium  sulfate.  Check levels in AM.  Hyperlipidemia, unspecified Continue Crestor                 BPH Continue Flomax      Permanent A fib  Continue metoprolol  and Eliquis      Essential hypertension Continue metoprolol    Elevated MCV, chronic Thrombocytopenia No evidence of bleeding.  Will continue to monitor     Hypokalemia Improved after repletion.  Latest potassium of 3.9.     Hyponatremia Improved after hydration.  Latest sodium of 134     COPD (chronic obstructive pulmonary disease) Continue DuoNebs     Dementia without behavioral disturbance  Continue supportive care.  Debility deconditioning seen with therapy and recommend home health PT on discharge.  Will ambulate in the hallway today.   DVT prophylaxis: apixaban  (ELIQUIS ) tablet 2.5 mg Start: 09/09/23 2300 apixaban  (ELIQUIS ) tablet 2.5 mg   Disposition: Home with home health likely in 1 to 2 days.  Status is: Inpatient Remains inpatient appropriate because: Pending clinical improvement, IV antibiotic    Code Status:     Code Status: Limited: Do not attempt resuscitation (DNR) -DNR-LIMITED -Do Not Intubate/DNI   Family Communication: Spoke with the patient's daughter and  elder son at bedside today.  Consultants: None  Procedures: None  Anti-infectives:  Unasyn, will change to cefepime  and Flagyl .  Anti-infectives (From admission, onward)    Start     Dose/Rate Route Frequency Ordered Stop   09/09/23 2300  Ampicillin-Sulbactam  (UNASYN) 3 g in sodium chloride  0.9 % 100 mL IVPB        3 g 200 mL/hr over 30 Minutes Intravenous Every 6 hours 09/09/23 2259     09/09/23 2030  cefTRIAXone  (ROCEPHIN ) 1 g in sodium chloride  0.9 % 100 mL IVPB        1 g 200 mL/hr over 30 Minutes Intravenous  Once 09/09/23 2024 09/09/23 2155   09/09/23 2030  azithromycin  (ZITHROMAX ) 500 mg in sodium chloride  0.9 % 250 mL IVPB        500 mg 250 mL/hr over 60 Minutes Intravenous  Once 09/09/23 2024 09/09/23 2300      Subjective: Today, patient was seen and examined at bedside.  Patient has been able to eat okay.  Poor historian.  Elderly male.  Hard of hearing.  Family at bedside.  Still on supplemental oxygen .  Objective: Vitals:   09/12/23 0826 09/12/23 0840  BP: (!) 156/80 (!) 156/80  Pulse: 79 79  Resp: 16   Temp: 98.4 F (36.9 C)   SpO2: 99%     Intake/Output Summary (Last 24 hours) at 09/12/2023 1024 Last data filed at 09/12/2023 0820 Gross per 24 hour  Intake 1360 ml  Output 350 ml  Net 1010 ml   Filed Weights   09/09/23 1757  Weight: 58.5 kg   Body mass index is 23.59 kg/m.   Physical Exam:  GENERAL: Patient is alert awake and Communicative, hard of hearing not in obvious distress.  On nasal cannula oxygen . HENT: No scleral pallor or icterus. Pupils equally reactive to light. Oral mucosa is moist NECK: is supple, no gross swelling noted. CHEST: Coarse breath sounds noted, diminished breath sounds bilaterally. CVS: S1 and S2 heard, no murmur. Regular rate and rhythm.  ABDOMEN: Soft, non-tender, bowel sounds are present. EXTREMITIES: No edema. CNS: Cranial nerves are intact.  Moves all extremities, generalized weakness noted. SKIN: warm and dry without rashes.  Data Review: I have personally reviewed the following laboratory data and studies,  CBC: Recent Labs  Lab 09/09/23 1808 09/10/23 0224 09/11/23 0610  WBC 15.8* 11.6* 7.9  HGB 10.1* 9.4* 8.3*  HCT 29.6* 28.5* 24.8*  MCV 100.3* 103.6* 102.9*  PLT  82* 61* 58*   Basic Metabolic Panel: Recent Labs  Lab 09/09/23 2100 09/10/23 0224 09/10/23 1022 09/10/23 1356 09/11/23 0610 09/12/23 0609  NA  --  136 131* 139 135 134*  K  --  2.7* 3.0* 2.8* 3.9 3.9  CL  --  105 104 107 109 106  CO2  --  22 20* 19* 19* 20*  GLUCOSE  --  116* 156* 175* 114* 99  BUN  --  26* 20 18 13 11   CREATININE  --  1.04 1.05 1.34* 0.94 0.90  CALCIUM   --  8.0* 7.5* 7.2* 7.6* 7.7*  MG 1.7  --   --   --   --  1.6*   Liver Function Tests: Recent Labs  Lab 09/10/23 0224  AST 16  ALT 17  ALKPHOS 55  BILITOT 1.0  PROT 5.9*  ALBUMIN 2.6*   No results for input(s): "LIPASE", "AMYLASE" in the last 168 hours. No results for input(s): "AMMONIA" in the last 168 hours. Cardiac Enzymes:  No results for input(s): "CKTOTAL", "CKMB", "CKMBINDEX", "TROPONINI" in the last 168 hours. BNP (last 3 results) Recent Labs    08/10/23 1633 08/13/23 1020 09/10/23 0224  BNP 802* 587.6* 927.6*    ProBNP (last 3 results) No results for input(s): "PROBNP" in the last 8760 hours.  CBG: No results for input(s): "GLUCAP" in the last 168 hours. Recent Results (from the past 240 hours)  Urine Culture     Status: Abnormal (Preliminary result)   Collection Time: 09/09/23  8:10 PM   Specimen: Urine, Clean Catch  Result Value Ref Range Status   Specimen Description URINE, CLEAN CATCH  Final   Special Requests NONE  Final   Culture (A)  Final    >=100,000 COLONIES/mL PSEUDOMONAS AERUGINOSA 20,000 COLONIES/mL KLEBSIELLA PNEUMONIAE SUSCEPTIBILITIES TO FOLLOW Performed at [facility], [address] [place], [place] [address]    Report Status PENDING  Incomplete  SARS Coronavirus 2 by RT PCR (hospital order, performed in [facility] hospital lab) *cepheid single result test* Anterior Nasal Swab     Status: None   Collection Time: 09/10/23  4:53 AM   Specimen: Anterior Nasal Swab  Result Value Ref Range Status   SARS Coronavirus 2 by RT PCR NEGATIVE NEGATIVE Final     Comment: Performed at [facility], [address], [place], [place] [address]  Respiratory (~20 pathogens) panel by PCR     Status: None   Collection Time: 09/10/23  4:53 AM   Specimen: Anterior Nasal Swab; Respiratory  Result Value Ref Range Status   Adenovirus NOT DETECTED NOT DETECTED Final   Coronavirus 229E NOT DETECTED NOT DETECTED Final    Comment: (NOTE) The Coronavirus on the Respiratory Panel, DOES NOT test for the novel  Coronavirus (2019 nCoV)    Coronavirus HKU1 NOT DETECTED NOT DETECTED Final   Coronavirus NL63 NOT DETECTED NOT DETECTED Final   Coronavirus OC43 NOT DETECTED NOT DETECTED Final   Metapneumovirus NOT DETECTED NOT DETECTED Final   Rhinovirus / Enterovirus NOT DETECTED NOT DETECTED Final   Influenza A NOT DETECTED NOT DETECTED Final   Influenza B NOT DETECTED NOT DETECTED Final   Parainfluenza Virus 1 NOT DETECTED NOT DETECTED Final   Parainfluenza Virus 2 NOT DETECTED NOT DETECTED Final   Parainfluenza Virus 3 NOT DETECTED NOT DETECTED Final   Parainfluenza Virus 4 NOT DETECTED NOT DETECTED Final   Respiratory Syncytial Virus NOT DETECTED NOT DETECTED Final   Bordetella pertussis NOT DETECTED NOT DETECTED Final   Bordetella Parapertussis NOT DETECTED NOT DETECTED Final   Chlamydophila pneumoniae NOT DETECTED NOT DETECTED Final   Mycoplasma pneumoniae NOT DETECTED NOT DETECTED Final    Comment: Performed at [facility], [address], [place], [place] [address]     Studies: No results found.     Mckale Haffey, MD  [facility] 09/12/2023  If 7PM-7AM, please contact night-coverage

## 2023-09-12 NOTE — Progress Notes (Signed)
   09/12/23 1100  Vitals  Pulse Rate 76  Level of Consciousness  Level of Consciousness Alert  MEWS COLOR  MEWS Score Color Green  Oxygen  Therapy  SpO2 98 %  O2 Device Room Air  Pain Assessment  Pain Scale 0-10  Pain Score 0  MEWS Score  MEWS Temp 0  MEWS Systolic 0  MEWS Pulse 0  MEWS RR 0  MEWS LOC 0  MEWS Score 0

## 2023-09-12 NOTE — Patient Instructions (Signed)
 Visit Information  Thank you for taking time to visit with me today. Please don't hesitate to contact me if I can be of assistance to you before our next scheduled appointment.  Your next care management appointment is by telephone on 07/01 at 10 AM  Please call the care guide team at [phone] if you need to cancel, schedule, or reschedule an appointment.   Please call the Suicide and Crisis Lifeline: 988 call 911 if you are experiencing a Mental Health or Behavioral Health Crisis or need someone to talk to.  Alease Hunter, LCSW [place]  [facility], [facility] Clinical Social Worker Direct Dial: [phone]  Fax: [phone] Website: [email or web address] 9:51 AM

## 2023-09-12 NOTE — Plan of Care (Signed)
  Problem: Clinical Measurements: Goal: Respiratory complications will improve Outcome: Progressing   Problem: Nutrition: Goal: Adequate nutrition will be maintained Outcome: Progressing   Problem: Pain Managment: Goal: General experience of comfort will improve and/or be controlled Outcome: Progressing

## 2023-09-12 NOTE — Patient Outreach (Signed)
 Complex Care Management   Visit Note  09/12/2023  Name:  Evan [name] MRN: [id number] DOB: [birth year]  Situation: Referral received for Complex Care Management related to Mental/Behavioral Health diagnosis Depression and GAD I obtained verbal consent from Caregiver.  Visit completed with Caregiver, pt's daughter  on the phone  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   Altered mental status 08/21/2018   Anginal chest pain at rest [facility])    Chronic non, controlled on Lorazepam    Anxiety    Arthritis    "all over"    BPH (benign prostatic hyperplasia)    CAD (coronary artery disease)    Cellulitis of arm, right 06/01/2018   Cellulitis of left axilla    Chronic lower back pain    Complication of anesthesia    "problems making water afterwards"   Depression    DJD (degenerative joint disease)    Esophageal ulcer without bleeding    bid ppi indefinitely   Fall 07/16/2018   Family history of adverse reaction to anesthesia    "children w/PONV"   GERD (gastroesophageal reflux disease)    Haemophilus influenzae septicemia (HCC) 03/12/2023   Headache    "couple/week maybe" (09/04/2015)   Hemochromatosis    Possible, elevated iron  stores, hook-like osteophytes on hand films, normal LFTs   Hepatitis    "yellow jaundice as a baby"   History of ASCVD    MULTIVESSEL   Hyperlipidemia    Hypertension    Hypotension    Mild aortic stenosis 06/23/2021   Osteoarthritis    Peripheral vascular disease (HCC)    Pneumonia    "several times; got a little now" (09/04/2015)   Pulmonary embolism (HCC) 02/02/2018   Rash and nonspecific skin eruption 06/01/2018   Rheumatoid arthritis (HCC)    RSV (respiratory syncytial virus pneumonia) 05/15/2022   Subdural  hematoma (HCC)    small after fall 09/2016-plavix  held, neurosurgery consulted.  Asymptomatic.     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 PO Eliquis     Assessment: Patient Reported Symptoms:  Cognitive Cognitive Status: Unable to Assess      Neurological Neurological Review of Symptoms: Not assessed    HEENT HEENT Symptoms Reported: Not assessed      Cardiovascular Cardiovascular Symptoms Reported: Not assessed    Respiratory Respiratory Symptoms Reported: Not assesed    Endocrine Patient reports the following symptoms related to hypoglycemia or hyperglycemia : Not assessed    Gastrointestinal Gastrointestinal Symptoms Reported: Not assessed      Genitourinary Genitourinary Symptoms Reported: Not assessed    Integumentary Integumentary Symptoms Reported: Not assessed    Musculoskeletal Musculoskelatal Symptoms Reviewed: Not assessed        Psychosocial Psychosocial Symptoms Reported: Anxiety - if selected complete GAD Additional Psychological Details: Patient is currently hospitalized. Pt's daughter reports he has difficulty managing anxiety symptoms triggered by chronic health and multiple hospital visits in the last couple of months. Healthy coping skills discussed with her Behavioral Health Conditions: Anxiety, Depression Behavioral Management Strategies: Support system, Coping strategies, Adequate rest Major Change/Loss/Stressor/Fears (CP): Medical condition, self Techniques to Cope with Loss/Stress/Change: Diversional activities Quality of Family Relationships: helpful, supportive, involved      07/10/2023   12:11 PM  Depression screen PHQ 2/9  Decreased Interest 3  Down, Depressed, Hopeless 3  PHQ - 2 Score 6  Altered sleeping 0  Tired, decreased energy 2  Change in appetite 0  Feeling bad or failure about yourself  1  Trouble concentrating 1  Moving slowly or fidgety/restless 1  Suicidal thoughts 1  PHQ-9 Score 12    There  were no vitals filed for this visit.  Medications Reviewed Today     Reviewed by Aryn Safran D, LCSW (Social Worker) on 09/12/23 at 0945  Med List Status: <None>   Medication Order Taking? Sig Documenting Provider Last Dose Status Informant  acetaminophen  (TYLENOL ) 325 MG tablet [id number] No Take 325-650 mg by mouth 2 (two) times daily as needed for mild pain (pain score 1-3), moderate pain (pain score 4-6), fever or headache. [provider] 09/09/2023 Noon Active Child, Pharmacy Records  apixaban  (ELIQUIS ) 2.5 MG TABS tablet [id number] No Take 1 tablet (2.5 mg total) by mouth 2 (two) times daily. Gerald Kitty., NP 09/09/2023 11:00 AM Active Child, Pharmacy Records  budesonide -formoterol  (SYMBICORT ) 160-4.5 MCG/ACT inhaler [id number] No Inhale 2 puffs into the lungs in the morning and at bedtime. Austine Lefort, MD 09/09/2023 Morning Active Child, Pharmacy Records  ferrous sulfate  [facility]) 325 (65 FE) MG tablet [id number] No TAKE 1 TABLET EVERY DAY Austine Lefort, MD 09/09/2023 Morning Active Child, Pharmacy Records  FLUoxetine  (PROZAC ) 20 MG capsule [id number] No TAKE 1 CAPSULE EVERY DAY Austine Lefort, MD 09/09/2023 Morning Active Child, Pharmacy Records  folic acid  (FOLVITE ) 1 MG tablet [id number] No Take 1 tablet (1 mg total) by mouth daily. [name], Curlee Doss, MD 09/09/2023 Morning Active Child, Pharmacy Records  furosemide  (LASIX ) 40 MG tablet [id number] No TAKE 1 TABLET EVERY DAY Austine Lefort, MD 09/08/2023 Bedtime Active Child, Pharmacy Records  hydrOXYzine  (ATARAX ) 10 MG/5ML syrup [id number] No Take 5 mLs (10 mg total) by mouth every 8 (eight) hours as needed for itching. [name], Curlee Doss, MD Past Week Active Child, Pharmacy Records  melatonin 3 MG TABS tablet [id number] No Take 1 tablet (3 mg total) by mouth at bedtime as needed (insomnia). [name], Curlee Doss, MD Past Week Active Child, Pharmacy Records  metoprolol  tartrate (LOPRESSOR ) 25 MG tablet [id number] No  Take 25 mg by mouth daily. [provider] 09/09/2023 Morning Active   Multiple Vitamins-Minerals (MENS 50+ MULTIVITAMIN) TABS [id number] No Take 1 tablet by mouth daily. [provider] 09/09/2023 Morning Active Child, Pharmacy Records  mupirocin  ointment (BACTROBAN ) 2 % [id number] No Apply 1 Application topically 2 (two) times daily. Hyatt, [name], DPM Past Week Active Child, Pharmacy Records  nitroGLYCERIN  (NITROSTAT ) 0.4 MG SL tablet [id number] No Place 1 tablet (0.4 mg total) under the tongue every 5 (five) minutes as needed for chest pain.  Patient not taking: Reported on 09/09/2023   Austine Lefort, MD Not Taking Active Child, Pharmacy Records           Med Note [place], Nieves Bars   XFG Sep 09, 2023 10:35 PM)    nystatin  (MYCOSTATIN /NYSTOP ) powder [id number] No Apply a thin layer topically to the affected area 2-3 (two to three) times daily.  09/09/2023 Morning Active Child, Pharmacy Records  pantoprazole  (PROTONIX ) 40 MG tablet [id number] No TAKE 1 TABLET EVERY MORNING Pickard, Warren T, MD 09/09/2023 Morning Active Child, Pharmacy Records  predniSONE  (DELTASONE ) 20 MG tablet [id number] No 3 tabs poqday 1-2, 2 tabs poqday 3-4, 1 tab poqday 5-6 Austine Lefort, MD 09/08/2023 Bedtime Active  Med Note (WHITE, Nieves Bars   Sat Sep 09, 2023 10:36 PM) Patient was due to take dose 6 today to complete course.  rosuvastatin  (CRESTOR ) 5 MG tablet [id number] No Take 1 tablet (5 mg total) by mouth at bedtime. [name], Curlee Doss, MD 09/08/2023 Bedtime Active Child, Pharmacy Records  senna-docusate (STIMULANT LAXATIVE) 8.6-50 MG tablet [id number] No Take 2 tablets by mouth 2 (two) times daily as needed for constipation  Past Week Active Child, Pharmacy Records  tamsulosin  (FLOMAX ) 0.4 MG CAPS capsule [id number] No Take 1 capsule (0.4 mg total) by mouth daily with supper.  Patient taking differently: Take 0.4 mg by mouth at bedtime.   Austine Lefort, MD 09/08/2023 Bedtime Active Child, Pharmacy  Records  Zinc  Oxide (TRIPLE PASTE) 12.8 % ointment [id number] No Apply topically 4 (four) times daily as needed (skin barrier).  Patient taking differently: Apply 1 Application topically 2 (two) times daily as needed (skin barrier).   Etter Hermann., MD 09/08/2023 Bedtime Active Child, Pharmacy Records            Recommendation:   PCP Follow-up Continue Current Plan of Care  Follow Up Plan:   Telephone follow-up 2-4 weeks  Alease Hunter, LCSW [place]  [facility], [facility] Clinical Social Worker Direct Dial: [phone]  Fax: [phone] Website: [email or web address] 9:51 AM

## 2023-09-13 ENCOUNTER — Inpatient Hospital Stay (HOSPITAL_COMMUNITY)

## 2023-09-13 DIAGNOSIS — J690 Pneumonitis due to inhalation of food and vomit: Secondary | ICD-10-CM | POA: Diagnosis not present

## 2023-09-13 LAB — URINE CULTURE: Culture: >=100000 — AB

## 2023-09-13 LAB — CBC WITH DIFFERENTIAL/PLATELET
Abs Immature Granulocytes: 0.1 10*3/uL — ABNORMAL HIGH (ref 0.00–0.07)
Basophils Absolute: 0 10*3/uL (ref 0.0–0.1)
Basophils Relative: 0 %
Eosinophils Absolute: 0 10*3/uL (ref 0.0–0.5)
Eosinophils Relative: 0 %
HCT: 27.5 % — ABNORMAL LOW (ref 39.0–52.0)
Hemoglobin: 9.1 g/dL — ABNORMAL LOW (ref 13.0–17.0)
Immature Granulocytes: 1 %
Lymphocytes Relative: 6 %
Lymphs Abs: 0.6 10*3/uL — ABNORMAL LOW (ref 0.7–4.0)
MCH: 33.7 pg (ref 26.0–34.0)
MCHC: 33.1 g/dL (ref 30.0–36.0)
MCV: 101.9 fL — ABNORMAL HIGH (ref 80.0–100.0)
Monocytes Absolute: 0.7 10*3/uL (ref 0.1–1.0)
Monocytes Relative: 8 %
Neutro Abs: 8.2 10*3/uL — ABNORMAL HIGH (ref 1.7–7.7)
Neutrophils Relative %: 85 %
Platelets: 61 10*3/uL — ABNORMAL LOW (ref 150–400)
RBC: 2.7 MIL/uL — ABNORMAL LOW (ref 4.22–5.81)
RDW: 14.8 % (ref 11.5–15.5)
WBC: 9.6 10*3/uL (ref 4.0–10.5)
nRBC: 0 % (ref 0.0–0.2)

## 2023-09-13 LAB — BASIC METABOLIC PANEL WITH GFR
Anion gap: 8 (ref 5–15)
BUN: 14 mg/dL (ref 8–23)
CO2: 18 mmol/L — ABNORMAL LOW (ref 22–32)
Calcium: 7.9 mg/dL — ABNORMAL LOW (ref 8.9–10.3)
Chloride: 108 mmol/L (ref 98–111)
Creatinine, Ser: 1.08 mg/dL (ref 0.61–1.24)
GFR, Estimated: >60 mL/min (ref >60)
Glucose, Bld: 102 mg/dL — ABNORMAL HIGH (ref 70–99)
Potassium: 3.7 mmol/L (ref 3.5–5.1)
Sodium: 134 mmol/L — ABNORMAL LOW (ref 135–145)

## 2023-09-13 LAB — BRAIN NATRIURETIC PEPTIDE: B Natriuretic Peptide: 1666.8 pg/mL — ABNORMAL HIGH (ref 0.0–100.0)

## 2023-09-13 LAB — MAGNESIUM: Magnesium: 1.7 mg/dL (ref 1.7–2.4)

## 2023-09-13 MED ORDER — FUROSEMIDE 40 MG PO TABS: 40.0000 mg | ORAL_TABLET | Freq: Every day | ORAL | Status: DC

## 2023-09-13 MED ORDER — FUROSEMIDE 10 MG/ML IJ SOLN
40.0000 mg | Freq: Two times a day (BID) | INTRAMUSCULAR | Status: DC
  Administered 2023-09-13 – 2023-09-15 (×5): 40 mg via INTRAVENOUS
  Filled 2023-09-13 (×5): qty 4

## 2023-09-13 NOTE — Progress Notes (Signed)
 PROGRESS NOTE    Evan [name]  EAV:[id number] DOB: [birth year] DOA: 09/09/2023 PCP: Austine Lefort, MD   Brief Narrative:  Evan [name] is a 89 y.o. male with past medical history significant of dementia, CAD status post CABG, HFpEF, CVA, hypertension, hyperlipidemia, CKD stage IIIa, COPD, PVD, history of PE on Eliquis , lung cancer, anxiety, BPH with chronic Foley, depression, esophageal ulcer, GERD, hemochromatosis, rheumatoid arthritis, chronic low back pain, chronic anemia and thrombocytopenia with recent admission 5/11-5/19 for metapneumovirus pneumonia and heart failure exacerbation, admitted again 5/21-5/22 for altered mental status and right upper extremity/hand cellulitis.  Patient presented at this time to the ED with shortness of breath and feeling of not feeling well overall.  In the ED patient was given supplemental oxygen  and Foley catheter was exchanged and was out of the hospital for possible aspiration pneumonia.   Assessment & Plan:   Principal Problem:   Aspiration pneumonia (HCC) Active Problems:   Hyperlipidemia, unspecified   Permanent atrial fibrillation (HCC)   Hypokalemia   Essential hypertension   Benign prostatic hyperplasia without lower urinary tract symptoms   COPD (chronic obstructive pulmonary disease) (HCC)   Dementia without behavioral disturbance (HCC)   Thrombocytopenia (HCC)   Hyponatremia   Acute hypoxemic respiratory failure (HCC)   Sepsis (HCC)  Sepsis and acute hypoxic respiratory secondary to community-acquired pneumonia: Feeling better, weaned to room air, saturating over 90% at rest.  Continue cefepime .  Acute on chronic congestive heart failure with preserved ejection fraction: Last night, patient was complaining of shortness of breath and cough, chest x-ray was obtained which showed pulmonary edema.  Patient's BNP upon arrival was slightly elevated than his baseline up to 927 however his baseline appears to be around 600.  Initial chest x-ray  did not show pulmonary edema though.  On examination today, he does appear to have crackles but he denies any shortness of breath.  I am rechecking his BNP.  He appears to be taking Lasix  40 mg p.o. daily PTA which is on hold for some reason not clear to me.  I will start him on Lasix  40 mg IV twice daily with a strict I's and O's and daily weight.  UTI: Urine cultures growing Pseudomonas and Klebsiella.  Patient remains on cefepime  for that.  Hypomagnesemia.  Resolved  Hyponatremia: Mild and improving.  Patient asymptomatic.   Hyperlipidemia, unspecified Continue Crestor                 BPH Continue Flomax      Permanent A fib  continue metoprolol  and Eliquis      Essential hypertension Blood pressure controlled, continue metoprolol    Elevated MCV, chronic Thrombocytopenia No evidence of bleeding.  Will continue to monitor     COPD (chronic obstructive pulmonary disease) Continue DuoNebs, stable.     Dementia without behavioral disturbance  Continue supportive care.  He is at baseline.   Debility deconditioning seen with therapy and recommend home health PT on discharge.     DVT prophylaxis: apixaban  (ELIQUIS ) tablet 2.5 mg Start: 09/09/23 2300 apixaban  (ELIQUIS ) tablet 2.5 mg   DVT prophylaxis: apixaban  (ELIQUIS ) tablet 2.5 mg Start: 09/09/23 2300   Code Status: Limited: Do not attempt resuscitation (DNR) -DNR-LIMITED -Do Not Intubate/DNI   Family Communication: Son and daughter present at bedside.  Plan of care discussed in length.  Status is: Inpatient Remains inpatient appropriate because: Needs IV diuresis.   Estimated body mass index is 23.59 kg/m as calculated from the following:   Height as of  this encounter: 5' 2 (1.575 m).   Weight as of this encounter: 58.5 kg.    Nutritional Assessment: Body mass index is 23.59 kg/m.Evan [name] Seen by dietician.  I agree with the assessment and plan as outlined below: Nutrition Status:        . Skin Assessment: I have  examined the patient's skin and I agree with the wound assessment as performed by the wound care RN as outlined below:    Consultants:  None  Procedures:  None  Antimicrobials:  Anti-infectives (From admission, onward)    Start     Dose/Rate Route Frequency Ordered Stop   09/12/23 1215  ceFEPIme  (MAXIPIME ) 2 g in sodium chloride  0.9 % 100 mL IVPB        2 g 200 mL/hr over 30 Minutes Intravenous Every 12 hours 09/12/23 1127     09/12/23 1215  metroNIDAZOLE  (FLAGYL ) IVPB 500 mg        500 mg 100 mL/hr over 60 Minutes Intravenous Every 12 hours 09/12/23 1127     09/09/23 2300  Ampicillin-Sulbactam (UNASYN) 3 g in sodium chloride  0.9 % 100 mL IVPB  Status:  Discontinued        3 g 200 mL/hr over 30 Minutes Intravenous Every 6 hours 09/09/23 2259 09/12/23 1127   09/09/23 2030  cefTRIAXone  (ROCEPHIN ) 1 g in sodium chloride  0.9 % 100 mL IVPB        1 g 200 mL/hr over 30 Minutes Intravenous  Once 09/09/23 2024 09/09/23 2155   09/09/23 2030  azithromycin  (ZITHROMAX ) 500 mg in sodium chloride  0.9 % 250 mL IVPB        500 mg 250 mL/hr over 60 Minutes Intravenous  Once 09/09/23 2024 09/09/23 2300         Subjective: Patient seen and examined, daughter and son at the bedside.  Patient fully alert and partly oriented at his baseline due to dementia.  Appears comfortable and denies any shortness of breath or any other complaint.  Objective: Vitals:   09/13/23 0517 09/13/23 0740 09/13/23 0818 09/13/23 0828  BP: (!) 124/90 130/80 130/80   Pulse: 83 82 82   Resp: 18 18    Temp: 98.4 F (36.9 C) 97.6 F (36.4 C)    TempSrc: Oral Oral    SpO2: 100% 98%  98%  Weight:      Height:        Intake/Output Summary (Last 24 hours) at 09/13/2023 1411 Last data filed at 09/13/2023 0523 Gross per 24 hour  Intake 216.2 ml  Output 700 ml  Net -483.8 ml   Filed Weights   09/09/23 1757  Weight: 58.5 kg    Examination:  General exam: Appears calm and comfortable  Respiratory system:  Crackles bilaterally. Respiratory effort normal. Cardiovascular system: S1 & S2 heard, RRR. No JVD, murmurs, rubs, gallops or clicks. No pedal edema. Gastrointestinal system: Abdomen is nondistended, soft and nontender. No organomegaly or masses felt. Normal bowel sounds heard. Central nervous system: Alert and oriented x 1-2. No focal neurological deficits. Extremities: Symmetric 5 x 5 power. Skin: No rashes, lesions or ulcers  Data Reviewed: I have personally reviewed following labs and imaging studies  CBC: Recent Labs  Lab 09/09/23 1808 09/10/23 0224 09/11/23 0610 09/13/23 1023  WBC 15.8* 11.6* 7.9 9.6  NEUTROABS  --   --   --  8.2*  HGB 10.1* 9.4* 8.3* 9.1*  HCT 29.6* 28.5* 24.8* 27.5*  MCV 100.3* 103.6* 102.9* 101.9*  PLT 82* 61* 58* 61*  Basic Metabolic Panel: Recent Labs  Lab 09/09/23 2100 09/10/23 0224 09/10/23 1022 09/10/23 1356 09/11/23 0610 09/12/23 0609 09/13/23 0631 09/13/23 1023  NA  --    < > 131* 139 135 134* 134*  --   K  --    < > 3.0* 2.8* 3.9 3.9 3.7  --   CL  --    < > 104 107 109 106 108  --   CO2  --    < > 20* 19* 19* 20* 18*  --   GLUCOSE  --    < > 156* 175* 114* 99 102*  --   BUN  --    < > 20 18 13 11 14   --   CREATININE  --    < > 1.05 1.34* 0.94 0.90 1.08  --   CALCIUM   --    < > 7.5* 7.2* 7.6* 7.7* 7.9*  --   MG 1.7  --   --   --   --  1.6*  --  1.7   < > = values in this interval not displayed.   GFR: Estimated Creatinine Clearance: 32.3 mL/min (by C-G formula based on SCr of 1.08 mg/dL). Liver Function Tests: Recent Labs  Lab 09/10/23 0224  AST 16  ALT 17  ALKPHOS 55  BILITOT 1.0  PROT 5.9*  ALBUMIN 2.6*   No results for input(s): LIPASE, AMYLASE in the last 168 hours. No results for input(s): AMMONIA in the last 168 hours. Coagulation Profile: No results for input(s): INR, PROTIME in the last 168 hours. Cardiac Enzymes: No results for input(s): CKTOTAL, CKMB, CKMBINDEX, TROPONINI in the last 168  hours. BNP (last 3 results) No results for input(s): PROBNP in the last 8760 hours. HbA1C: No results for input(s): HGBA1C in the last 72 hours. CBG: No results for input(s): GLUCAP in the last 168 hours. Lipid Profile: No results for input(s): CHOL, HDL, LDLCALC, TRIG, CHOLHDL, LDLDIRECT in the last 72 hours. Thyroid  Function Tests: No results for input(s): TSH, T4TOTAL, FREET4, T3FREE, THYROIDAB in the last 72 hours. Anemia Panel: No results for input(s): VITAMINB12, FOLATE, FERRITIN, TIBC, IRON , RETICCTPCT in the last 72 hours. Sepsis Labs: No results for input(s): PROCALCITON, LATICACIDVEN in the last 168 hours.  Recent Results (from the past 240 hours)  Urine Culture     Status: Abnormal   Collection Time: 09/09/23  8:10 PM   Specimen: Urine, Clean Catch  Result Value Ref Range Status   Specimen Description URINE, CLEAN CATCH  Final   Special Requests   Final    NONE Performed at [facility], [address] [place], [place] [address]    Culture (A)  Final    >=100,000 COLONIES/mL PSEUDOMONAS AERUGINOSA 20,000 COLONIES/mL KLEBSIELLA PNEUMONIAE    Report Status 09/13/2023 FINAL  Final   Organism ID, Bacteria PSEUDOMONAS AERUGINOSA (A)  Final   Organism ID, Bacteria KLEBSIELLA PNEUMONIAE (A)  Final      Susceptibility   Klebsiella pneumoniae - MIC*    AMPICILLIN >=32 RESISTANT Resistant     CEFAZOLIN  <=4 SENSITIVE Sensitive     CEFEPIME  <=0.12 SENSITIVE Sensitive     CEFTRIAXONE  <=0.25 SENSITIVE Sensitive     CIPROFLOXACIN  <=0.25 SENSITIVE Sensitive     GENTAMICIN  <=1 SENSITIVE Sensitive     IMIPENEM <=0.25 SENSITIVE Sensitive     NITROFURANTOIN 128 RESISTANT Resistant     TRIMETH /SULFA  <=20 SENSITIVE Sensitive     AMPICILLIN/SULBACTAM >=32 RESISTANT Resistant     PIP/TAZO 16  SENSITIVE Sensitive ug/mL    * 20,000 COLONIES/mL KLEBSIELLA PNEUMONIAE   Pseudomonas aeruginosa - MIC*    CEFTAZIDIME 2 SENSITIVE Sensitive      CIPROFLOXACIN  <=0.25 SENSITIVE Sensitive     GENTAMICIN  <=1 SENSITIVE Sensitive     IMIPENEM 0.5 SENSITIVE Sensitive     PIP/TAZO 8 SENSITIVE Sensitive ug/mL    CEFEPIME  2 SENSITIVE Sensitive     * >=100,000 COLONIES/mL PSEUDOMONAS AERUGINOSA  SARS Coronavirus 2 by RT PCR (hospital order, performed in [facility] hospital lab) *cepheid single result test* Anterior Nasal Swab     Status: None   Collection Time: 09/10/23  4:53 AM   Specimen: Anterior Nasal Swab  Result Value Ref Range Status   SARS Coronavirus 2 by RT PCR NEGATIVE NEGATIVE Final    Comment: Performed at [facility], [address] [place], [place] [address]  Respiratory (~20 pathogens) panel by PCR     Status: None   Collection Time: 09/10/23  4:53 AM   Specimen: Anterior Nasal Swab; Respiratory  Result Value Ref Range Status   Adenovirus NOT DETECTED NOT DETECTED Final   Coronavirus 229E NOT DETECTED NOT DETECTED Final    Comment: (NOTE) The Coronavirus on the Respiratory Panel, DOES NOT test for the novel  Coronavirus (2019 nCoV)    Coronavirus HKU1 NOT DETECTED NOT DETECTED Final   Coronavirus NL63 NOT DETECTED NOT DETECTED Final   Coronavirus OC43 NOT DETECTED NOT DETECTED Final   Metapneumovirus NOT DETECTED NOT DETECTED Final   Rhinovirus / Enterovirus NOT DETECTED NOT DETECTED Final   Influenza A NOT DETECTED NOT DETECTED Final   Influenza B NOT DETECTED NOT DETECTED Final   Parainfluenza Virus 1 NOT DETECTED NOT DETECTED Final   Parainfluenza Virus 2 NOT DETECTED NOT DETECTED Final   Parainfluenza Virus 3 NOT DETECTED NOT DETECTED Final   Parainfluenza Virus 4 NOT DETECTED NOT DETECTED Final   Respiratory Syncytial Virus NOT DETECTED NOT DETECTED Final   Bordetella pertussis NOT DETECTED NOT DETECTED Final   Bordetella Parapertussis NOT DETECTED NOT DETECTED Final   Chlamydophila pneumoniae NOT DETECTED NOT DETECTED Final   Mycoplasma pneumoniae NOT DETECTED NOT DETECTED Final    Comment:  Performed at [facility], [address], [place], [place] [address]     Radiology Studies: DG CHEST PORT 1 VIEW Result Date: 09/13/2023 CLINICAL DATA:  Sudden onset shortness of breath EXAM: PORTABLE CHEST 1 VIEW COMPARISON:  09/10/2023 FINDINGS: Cardiac shadow is prominent but stable. Postsurgical changes are noted. Aortic calcifications are seen. Slight increase in the degree of interstitial changes are noted consistent with worsening edema. No focal confluent infiltrate or effusion is noted. IMPRESSION: Worsening edema. Electronically Signed   By: Violeta Grey M.D.   On: 09/13/2023 00:26    Scheduled Meds:  apixaban   2.5 mg Oral BID   Chlorhexidine  Gluconate Cloth  6 each Topical Daily   ferrous sulfate   325 mg Oral Daily   FLUoxetine   20 mg Oral Daily   fluticasone  furoate-vilanterol  1 puff Inhalation Daily   folic acid   1 mg Oral Daily   furosemide   40 mg Intravenous BID   metoprolol  tartrate  25 mg Oral Daily   multivitamin with minerals  1 tablet Oral Daily   pantoprazole   40 mg Oral q morning   rosuvastatin   5 mg Oral QHS   tamsulosin   0.4 mg Oral QHS   Continuous Infusions:  ceFEPime  (MAXIPIME ) IV 2 g (09/13/23 1130)   metronidazole  500 mg (  09/13/23 1218)     LOS: 3 days   Modena Andes, MD [facility]  09/13/2023, 2:11 PM   *Please note that this is a verbal dictation therefore any spelling or grammatical errors are due to the Dragon Medical One system interpretation.  Please page via Amion and do not message via secure chat for urgent patient care matters. Secure chat can be used for non urgent patient care matters.  How to contact the [facility] Attending or Consulting provider 7A - 7P or covering provider during after hours 7P -7A, for this patient?  Check the care team in [facility] and look for a) attending/consulting [facility] provider listed and b) the [facility] team listed. Page or secure chat 7A-7P. Log into [email or web address] and use [facility] universal password to  access. If you do not have the password, please contact the hospital operator. Locate the [facility] provider you are looking for under [facility] and page to a number that you can be directly reached. If you still have difficulty reaching the provider, please page the [facility] (Director on Call) for the Hospitalists listed on amion for assistance.

## 2023-09-13 NOTE — Progress Notes (Signed)
 Occupational Therapy Treatment Patient Details Name: Evan [name] MRN: [id number] DOB: 1927/06/08 Today's Date: 09/13/2023   History of present illness 89 yo male presents to [facility] on 6/7 with SOB. Admitted for possible aspiration pneumonia. PMH includes  dementia, CAD, CABG, HFpEF, CVA, PE on anticoagulation, lung cancer, hypertension, CKD 3B, COPD, BPH with chronic Foley. Recent admissions to [facility] from 5/11-19 for metapneumovirus pneumonia and heart failure exacerbation; 5/21-5/22 for AMS and R hand cellulitis.   OT comments  Pt progressing well towards goals. Pt received sitting in stool, total assist +2 for hygiene in standing. Progressed to complete functional transfers with mod +2 assist. Pt HOH but following simple commands with increased time. Noted right lateral lean in standing for >3 minutes. Continues to be limited by decreased strength, balance, and activity tolerance. Pt can d/c home with max HH services and 24/7 family support and supervision. Will continue to follow acutely.       If plan is discharge home, recommend the following:  Two people to help with walking and/or transfers;Two people to help with bathing/dressing/bathroom;Assistance with cooking/housework;Direct supervision/assist for medications management;Direct supervision/assist for financial management;Assist for transportation;Help with stairs or ramp for entrance;Supervision due to cognitive status   Equipment Recommendations  BSC/3in1    Recommendations for Other Services      Precautions / Restrictions Precautions Precautions: Fall Recall of Precautions/Restrictions: Impaired Required Braces or Orthoses: Other Brace Other Brace: R LE metal upright AFO from home Restrictions Weight Bearing Restrictions Per Provider Order: No       Mobility Bed Mobility Overal bed mobility: Needs Assistance Bed Mobility: Supine to Sit     Supine to sit: Mod assist, +2 for physical assistance     General bed mobility  comments: +2 assist for trunk and scooting    Transfers Overall transfer level: Needs assistance Equipment used: Rolling walker (2 wheels) Transfers: Sit to/from Stand, Bed to chair/wheelchair/BSC Sit to Stand: +2 physical assistance, Mod assist     Step pivot transfers: Mod assist, +2 physical assistance     General transfer comment: STS from EOB x3 mod A +2,  assist with RW d/t RUE deficits     Balance Overall balance assessment: Needs assistance Sitting-balance support: Feet supported Sitting balance-[name] Scale: Fair Sitting balance - Comments: sitting EOB Postural control: Right lateral lean Standing balance support: During functional activity, Reliant on assistive device for balance, Bilateral upper extremity supported Standing balance-[name] Scale: Poor Standing balance comment: relies on external support       ADL either performed or assessed with clinical judgement   ADL Overall ADL's : Needs assistance/impaired   Toilet Transfer: Moderate assistance;+2 for physical assistance;+2 for safety/equipment;Stand-pivot;Rolling walker (2 wheels) Toilet Transfer Details (indicate cue type and reason): Simulated in room Toileting- Clothing Manipulation and Hygiene: Total assistance;+2 for physical assistance Toileting - Clothing Manipulation Details (indicate cue type and reason): incontinent of bowel in standing     Functional mobility during ADLs: Moderate assistance;+2 for physical assistance      Extremity/Trunk Assessment Upper Extremity Assessment Upper Extremity Assessment: RUE deficits/detail RUE Deficits / Details: hemiparesis from prior CVA, holds RUE in flexor synergy pattern. Decreased elbow extension RUE Sensation: decreased light touch RUE Coordination: decreased fine motor;decreased gross motor   Lower Extremity Assessment Lower Extremity Assessment: Defer to PT evaluation        Vision   Additional Comments: not formally assessed          Communication Communication Communication: Impaired Factors Affecting Communication: Hearing impaired  Cognition Arousal: Alert Behavior During Therapy: Flat affect Cognition: History of cognitive impairments     OT - Cognition Comments: dementia at baseline     Following commands: Impaired Following commands impaired: Follows one step commands inconsistently, Follows one step commands with increased time      Cueing   Cueing Techniques: Verbal cues, Tactile cues, Visual cues        General Comments SpO2 96% sitting in recliner at the end of the session    Pertinent Vitals/ Pain       Pain Assessment Pain Assessment: No/denies pain   Frequency  Min 2X/week        Progress Toward Goals  OT Goals(current goals can now be found in the care plan section)  Progress towards OT goals: Progressing toward goals  Acute Rehab OT Goals Patient Stated Goal: None stated OT Goal Formulation: With patient Time For Goal Achievement: 09/25/23 Potential to Achieve Goals: Fair ADL Goals Pt Will Perform Grooming: with set-up;sitting Pt Will Perform Upper Body Bathing: with min assist;sitting Pt Will Transfer to Toilet: with min assist;bedside commode Additional ADL Goal #1: Pt will compelte bed mobility with mod assist as precursor to ADLs.  Plan      Co-evaluation    PT/OT/SLP Co-Evaluation/Treatment: Yes Reason for Co-Treatment: Complexity of the patient's impairments (multi-system involvement);For patient/therapist safety;To address functional/ADL transfers PT goals addressed during session: Mobility/safety with mobility;Balance OT goals addressed during session: ADL's and self-care;Strengthening/ROM      AM-PAC OT 6 Clicks Daily Activity     Outcome Measure   Help from another person eating meals?: A Little Help from another person taking care of personal grooming?: A Little Help from another person toileting, which includes using toliet, bedpan, or urinal?:  Total Help from another person bathing (including washing, rinsing, drying)?: A Lot Help from another person to put on and taking off regular upper body clothing?: A Lot Help from another person to put on and taking off regular lower body clothing?: Total 6 Click Score: 12    End of Session Equipment Utilized During Treatment: Gait belt;Rolling walker (2 wheels)  OT Visit Diagnosis: Other abnormalities of gait and mobility (R26.89);Muscle weakness (generalized) (M62.81);Other symptoms and signs involving the nervous system (R29.898);Other symptoms and signs involving cognitive function   Activity Tolerance Patient tolerated treatment well   Patient Left in chair;with call bell/phone within reach;with family/visitor present;with chair alarm set   Nurse Communication Mobility status;Precautions;Need for lift equipment        Time: 0454-0981 OT Time Calculation (min): 21 min  Charges: OT General Charges $OT Visit: 1 Visit OT Treatments $Self Care/Home Management : 8-22 mins  Delmer Ferraris, OT  Acute Rehabilitation Services Office [phone] Secure chat preferred   Mickael Alamo 09/13/2023, 4:31 PM

## 2023-09-13 NOTE — Progress Notes (Signed)
 Physical Therapy Treatment Patient Details Name: Evan [name] MRN: [id number] DOB: [birth year] Today's Date: 09/13/2023   History of Present Illness 89 yo male presents to [facility] on 6/7 with SOB. Admitted for possible aspiration pneumonia. PMH includes  dementia, CAD, CABG, HFpEF, CVA, PE on anticoagulation, lung cancer, hypertension, CKD 3B, COPD, BPH with chronic Foley. Recent admissions to [facility] from 5/11-19 for metapneumovirus pneumonia and heart failure exacerbation; 5/21-5/22 for AMS and R hand cellulitis.    PT Comments  Pt received in supine and agreeable to session with daughter present. Pt requires grossly mod A +2 for bed mobility and transfers due to weakness and impaired balance. Pt noted to be incontinent of bowel upon standing requiring assist for pericare. Pt demonstrates poor standing tolerance with progressive R lateral lean with increased fatigue due to limited RUE support. Anticipate pt would have slightly improved stability using platform RW as at home. Pt requires increased cues for sequencing throughout session. Pt continues to benefit from PT services to progress toward functional mobility goals.    If plan is discharge home, recommend the following: A lot of help with walking and/or transfers;A lot of help with bathing/dressing/bathroom;Assist for transportation;Help with stairs or ramp for entrance   Can travel by private vehicle        Equipment Recommendations  Wheelchair (measurements PT);Wheelchair cushion (measurements PT)    Recommendations for Other Services       Precautions / Restrictions Precautions Precautions: Fall Recall of Precautions/Restrictions: Impaired Required Braces or Orthoses: Other Brace Other Brace: R LE metal upright AFO from home Restrictions Weight Bearing Restrictions Per Provider Order: No     Mobility  Bed Mobility Overal bed mobility: Needs Assistance Bed Mobility: Supine to Sit     Supine to sit: Mod assist, +2 for physical  assistance     General bed mobility comments: Pt able to advance BLE to EOB with min A for RLE and mod A +2 for trunk elevation and scooting forward to EOB with bedpad    Transfers Overall transfer level: Needs assistance Equipment used: Rolling walker (2 wheels) Transfers: Sit to/from Stand, Bed to chair/wheelchair/BSC Sit to Stand: +2 physical assistance, Mod assist   Step pivot transfers: Mod assist, +2 physical assistance       General transfer comment: STS from EOB x3 with mod A +2 for power up and balance. Step pivot to recliner with mod A +2 for balance, weight shifting, and RW management    Ambulation/Gait               General Gait Details: unable   Stairs             Wheelchair Mobility     Tilt Bed    Modified Rankin (Stroke Patients Only)       Balance Overall balance assessment: Needs assistance Sitting-balance support: Feet supported Sitting balance-[name] Scale: Fair Sitting balance - Comments: sitting EOB   Standing balance support: During functional activity, Reliant on assistive device for balance, Bilateral upper extremity supported Standing balance-[name] Scale: Poor Standing balance comment: relies on external support                            Communication Communication Communication: Impaired Factors Affecting Communication: Hearing impaired  Cognition Arousal: Alert Behavior During Therapy: Flat affect   PT - Cognitive impairments: History of cognitive impairments  Following commands: Impaired Following commands impaired: Follows one step commands inconsistently, Follows one step commands with increased time    Cueing Cueing Techniques: Verbal cues, Tactile cues, Visual cues  Exercises      General Comments General comments (skin integrity, edema, etc.): SpO2 96% sitting in recliner at the end of the session      Pertinent Vitals/Pain Pain Assessment Pain Assessment:  No/denies pain     PT Goals (current goals can now be found in the care plan section) Acute Rehab PT Goals Patient Stated Goal: home PT Goal Formulation: With patient/family Time For Goal Achievement: 09/25/23 Progress towards PT goals: Progressing toward goals    Frequency    Min 2X/week           Co-evaluation PT/OT/SLP Co-Evaluation/Treatment: Yes Reason for Co-Treatment: Complexity of the patient's impairments (multi-system involvement);For patient/therapist safety;To address functional/ADL transfers PT goals addressed during session: Mobility/safety with mobility;Balance        AM-PAC PT 6 Clicks Mobility   Outcome Measure  Help needed turning from your back to your side while in a flat bed without using bedrails?: A Lot Help needed moving from lying on your back to sitting on the side of a flat bed without using bedrails?: A Lot Help needed moving to and from a bed to a chair (including a wheelchair)?: A Lot Help needed standing up from a chair using your arms (e.g., wheelchair or bedside chair)?: A Lot Help needed to walk in hospital room?: Total Help needed climbing 3-5 steps with a railing? : Total 6 Click Score: 10    End of Session Equipment Utilized During Treatment: Gait belt;Other (comment) (R AFO) Activity Tolerance: Patient limited by fatigue Patient left: with call bell/phone within reach;with family/visitor present;in chair;with chair alarm set Nurse Communication: Mobility status;Need for lift equipment (stedy for back to bed) PT Visit Diagnosis: Other abnormalities of gait and mobility (R26.89);Ataxic gait (R26.0)     Time: 1610-9604 PT Time Calculation (min) (ACUTE ONLY): 27 min  Charges:    $Therapeutic Activity: 8-22 mins PT General Charges $$ ACUTE PT VISIT: 1 Visit                     Michaelle Adolphus, PTA Acute Rehabilitation Services Secure Chat Preferred  [phone]    Michaelle Adolphus 09/13/2023, 3:50 PM

## 2023-09-13 NOTE — Progress Notes (Signed)
   09/13/23 1502  Level of Consciousness  Level of Consciousness Alert  MEWS COLOR  MEWS Score Color Green  Oxygen  Therapy  SpO2 96 %  O2 Device Room Air  Pain Assessment  Pain Scale 0-10  Pain Score 0  MEWS Score  MEWS Temp 0  MEWS Systolic 0  MEWS Pulse 0  MEWS RR 0  MEWS LOC 0  MEWS Score 0

## 2023-09-13 NOTE — Care Management Important Message (Signed)
 Important Message  Patient Details  Name: Evan [name] MRN: [id number] Date of Birth: [birth year]   Important Message Given:  Yes - Medicare IM     Felix Host 09/13/2023, 12:05 PM

## 2023-09-14 DIAGNOSIS — J690 Pneumonitis due to inhalation of food and vomit: Secondary | ICD-10-CM | POA: Diagnosis not present

## 2023-09-14 LAB — CBC WITH DIFFERENTIAL/PLATELET
Abs Immature Granulocytes: 0.16 10*3/uL — ABNORMAL HIGH (ref 0.00–0.07)
Basophils Absolute: 0.1 10*3/uL (ref 0.0–0.1)
Basophils Relative: 1 %
Eosinophils Absolute: 0 10*3/uL (ref 0.0–0.5)
Eosinophils Relative: 0 %
HCT: 27.2 % — ABNORMAL LOW (ref 39.0–52.0)
Hemoglobin: 9.1 g/dL — ABNORMAL LOW (ref 13.0–17.0)
Immature Granulocytes: 3 %
Lymphocytes Relative: 17 %
Lymphs Abs: 1.1 10*3/uL (ref 0.7–4.0)
MCH: 33.7 pg (ref 26.0–34.0)
MCHC: 33.5 g/dL (ref 30.0–36.0)
MCV: 100.7 fL — ABNORMAL HIGH (ref 80.0–100.0)
Monocytes Absolute: 0.8 10*3/uL (ref 0.1–1.0)
Monocytes Relative: 13 %
Neutro Abs: 4.1 10*3/uL (ref 1.7–7.7)
Neutrophils Relative %: 66 %
Platelets: 59 10*3/uL — ABNORMAL LOW (ref 150–400)
RBC: 2.7 MIL/uL — ABNORMAL LOW (ref 4.22–5.81)
RDW: 15 % (ref 11.5–15.5)
WBC: 6.2 10*3/uL (ref 4.0–10.5)
nRBC: 0 % (ref 0.0–0.2)

## 2023-09-14 LAB — BASIC METABOLIC PANEL WITH GFR
Anion gap: 8 (ref 5–15)
BUN: 18 mg/dL (ref 8–23)
CO2: 20 mmol/L — ABNORMAL LOW (ref 22–32)
Calcium: 8 mg/dL — ABNORMAL LOW (ref 8.9–10.3)
Chloride: 106 mmol/L (ref 98–111)
Creatinine, Ser: 1.27 mg/dL — ABNORMAL HIGH (ref 0.61–1.24)
GFR, Estimated: 52 mL/min — ABNORMAL LOW (ref >60)
Glucose, Bld: 112 mg/dL — ABNORMAL HIGH (ref 70–99)
Potassium: 3.2 mmol/L — ABNORMAL LOW (ref 3.5–5.1)
Sodium: 134 mmol/L — ABNORMAL LOW (ref 135–145)

## 2023-09-14 LAB — MAGNESIUM: Magnesium: 1.6 mg/dL — ABNORMAL LOW (ref 1.7–2.4)

## 2023-09-14 MED ORDER — ORAL CARE MOUTH RINSE: 15.0000 mL | OROMUCOSAL | Status: DC | PRN

## 2023-09-14 MED ORDER — ORAL CARE MOUTH RINSE
15.0000 mL | OROMUCOSAL | Status: DC
  Administered 2023-09-14 – 2023-09-15 (×5): 15 mL via OROMUCOSAL

## 2023-09-14 MED ORDER — MAGNESIUM SULFATE 2 GM/50ML IV SOLN
2.0000 g | Freq: Once | INTRAVENOUS | Status: AC
  Administered 2023-09-14: 2 g via INTRAVENOUS
  Filled 2023-09-14: qty 50

## 2023-09-14 MED ORDER — SODIUM CHLORIDE 0.9 % IV SOLN
2.0000 g | INTRAVENOUS | Status: DC
  Administered 2023-09-14: 2 g via INTRAVENOUS
  Filled 2023-09-14: qty 12.5

## 2023-09-14 MED ORDER — SODIUM CHLORIDE 0.9 % IV SOLN: 1.0000 g | INTRAVENOUS | Status: DC

## 2023-09-14 MED ORDER — POTASSIUM CHLORIDE CRYS ER 20 MEQ PO TBCR
40.0000 meq | EXTENDED_RELEASE_TABLET | ORAL | Status: AC
  Administered 2023-09-14 (×2): 40 meq via ORAL
  Filled 2023-09-14 (×2): qty 2

## 2023-09-14 NOTE — Progress Notes (Signed)
 Physical Therapy Treatment Patient Details Name: Evan [name] MRN: [id number] DOB: January 20, 1928 Today's Date: 09/14/2023   History of Present Illness 89 yo male presents to [facility] on 6/7 with SOB. Admitted for possible aspiration pneumonia. PMH includes  dementia, CAD, CABG, HFpEF, CVA, PE on anticoagulation, lung cancer, hypertension, CKD 3B, COPD, BPH with chronic Foley. Recent admissions to [facility] from 5/11-19 for metapneumovirus pneumonia and heart failure exacerbation; 5/21-5/22 for AMS and R hand cellulitis.    PT Comments  Patient is agreeable to PT session with encouragement. Supportive family member in the room. Patient continues to require assistance with mobility but with increased independence this session. Two standing bouts performed with Mod A with limited standing tolerance for progression of ambulation. Bowel incontinence with standing with total care for peri care required. Patient is fatigued with minimal activity with mild dyspnea with exertion. Recommend to continue PT to maximize independence. Anticipate patient will need initial physical assistance from caregiver for safe return home.    If plan is discharge home, recommend the following: A lot of help with walking and/or transfers;A lot of help with bathing/dressing/bathroom;Assist for transportation;Help with stairs or ramp for entrance   Can travel by private vehicle        Equipment Recommendations  Wheelchair (measurements PT);Wheelchair cushion (measurements PT)    Recommendations for Other Services       Precautions / Restrictions Precautions Precautions: Fall Recall of Precautions/Restrictions: Impaired Required Braces or Orthoses: Other Brace Other Brace: R LE metal upright AFO from home Restrictions Weight Bearing Restrictions Per Provider Order: No     Mobility  Bed Mobility Overal bed mobility: Needs Assistance Bed Mobility: Supine to Sit     Supine to sit: Mod assist Sit to supine: Mod assist    General bed mobility comments: cues for task initiation and sequencing    Transfers Overall transfer level: Needs assistance Equipment used: Rolling walker (2 wheels) Transfers: Sit to/from Stand Sit to Stand: Mod assist           General transfer comment: 2 standing bouts performed with one person assistance. lifting and lowering assistance provided. initial assistance required for blocking right foot to prevent forward slipping with standing. seated break required bewteen bouts of standing due to fatigue.    Ambulation/Gait               General Gait Details: patient declined to attempt. poor standing tolerance and patient fatigued with minimal activity   Stairs             Wheelchair Mobility     Tilt Bed    Modified Rankin (Stroke Patients Only)       Balance Overall balance assessment: Needs assistance Sitting-balance support: Feet supported Sitting balance-[name] Scale: Fair     Standing balance support: During functional activity Standing balance-[name] Scale: Poor Standing balance comment: external support required                            Communication Communication Communication: Impaired Factors Affecting Communication: Hearing impaired  Cognition Arousal: Alert Behavior During Therapy: Flat affect   PT - Cognitive impairments: History of cognitive impairments                       PT - Cognition Comments: cooperative Following commands: Impaired Following commands impaired: Follows one step commands inconsistently, Follows one step commands with increased time    Cueing Cueing Techniques: Verbal cues,  Tactile cues, Visual cues  Exercises      General Comments General comments (skin integrity, edema, etc.): incontinent of bowel with standing with total assistance reqired for peri care. dyspnea with exertion with rest breaks required with activity      Pertinent Vitals/Pain Pain Assessment Pain Assessment:  No/denies pain    Home Living                          Prior Function            PT Goals (current goals can now be found in the care plan section) Acute Rehab PT Goals Patient Stated Goal: home PT Goal Formulation: With patient/family Time For Goal Achievement: 09/25/23 Potential to Achieve Goals: Fair Progress towards PT goals: Progressing toward goals    Frequency    Min 2X/week      PT Plan      Co-evaluation              AM-PAC PT 6 Clicks Mobility   Outcome Measure  Help needed turning from your back to your side while in a flat bed without using bedrails?: A Lot Help needed moving from lying on your back to sitting on the side of a flat bed without using bedrails?: A Lot Help needed moving to and from a bed to a chair (including a wheelchair)?: A Lot Help needed standing up from a chair using your arms (e.g., wheelchair or bedside chair)?: A Lot Help needed to walk in hospital room?: Total Help needed climbing 3-5 steps with a railing? : Total 6 Click Score: 10    End of Session Equipment Utilized During Treatment: Gait belt (R AFO) Activity Tolerance: Patient limited by fatigue Patient left: in bed;with call bell/phone within reach;with bed alarm set;with family/visitor present Nurse Communication: Mobility status PT Visit Diagnosis: Other abnormalities of gait and mobility (R26.89);Ataxic gait (R26.0)     Time: 7829-5621 PT Time Calculation (min) (ACUTE ONLY): 33 min  Charges:    $Therapeutic Activity: 23-37 mins PT General Charges $$ ACUTE PT VISIT: 1 Visit                     Ozie Bo, PT, MPT    Erlene Hawks 09/14/2023, 2:48 PM

## 2023-09-14 NOTE — Progress Notes (Signed)
 Pt refused morning ambulation

## 2023-09-14 NOTE — Progress Notes (Signed)
   09/14/23 0832  Vitals  BP 123/68  Patient Position (if appropriate) Lying  Pulse Rate 80  Level of Consciousness  Level of Consciousness Alert  MEWS COLOR  MEWS Score Color Green  Oxygen  Therapy  SpO2 99 %  O2 Device Room Air  Pain Assessment  Pain Scale Faces  Pain Score 0  MEWS Score  MEWS Temp 0  MEWS Systolic 0  MEWS Pulse 0  MEWS RR 0  MEWS LOC 0  MEWS Score 0

## 2023-09-14 NOTE — Progress Notes (Signed)
 PHARMACY NOTE:  ANTIMICROBIAL RENAL DOSAGE ADJUSTMENT  Current antimicrobial regimen includes a mismatch between antimicrobial dosage and estimated renal function.  As per policy approved by the Pharmacy & Therapeutics and Medical Executive Committees, the antimicrobial dosage will be adjusted accordingly.  Current antimicrobial dosage:  cefepime  2 g IV q12h  Indication: asp PNA and UTI  Renal Function:  Estimated Creatinine Clearance: 27.5 mL/min (A) (by C-G formula based on SCr of 1.27 mg/dL (H)). []      On intermittent HD, scheduled: []      On CRRT    Antimicrobial dosage has been changed to:  cefepime  2 g IV q24h  Additional comments:   Thank you for involving pharmacy in this patient's care.  Caroline Cinnamon, PharmD, BCPS Clinical Pharmacist Clinical phone for 09/14/2023 is [phone] 09/14/2023 10:11 AM

## 2023-09-14 NOTE — Progress Notes (Signed)
 PROGRESS NOTE    Evan [name]  OZH:[id number] DOB: [birth year] DOA: 09/09/2023 PCP: Austine Lefort, MD   Brief Narrative:  Evan [name] is a 89 y.o. male with past medical history significant of dementia, CAD status post CABG, HFpEF, CVA, hypertension, hyperlipidemia, CKD stage IIIa, COPD, PVD, history of PE on Eliquis , lung cancer, anxiety, BPH with chronic Foley, depression, esophageal ulcer, GERD, hemochromatosis, rheumatoid arthritis, chronic low back pain, chronic anemia and thrombocytopenia with recent admission 5/11-5/19 for metapneumovirus pneumonia and heart failure exacerbation, admitted again 5/21-5/22 for altered mental status and right upper extremity/hand cellulitis.  Patient presented at this time to the ED with shortness of breath and feeling of not feeling well overall.  In the ED patient was given supplemental oxygen  and Foley catheter was exchanged and was out of the hospital for possible aspiration pneumonia.   Assessment & Plan:   Principal Problem:   Aspiration pneumonia (HCC) Active Problems:   Hyperlipidemia, unspecified   Permanent atrial fibrillation (HCC)   Hypokalemia   Essential hypertension   Benign prostatic hyperplasia without lower urinary tract symptoms   COPD (chronic obstructive pulmonary disease) (HCC)   Dementia without behavioral disturbance (HCC)   Thrombocytopenia (HCC)   Hyponatremia   Acute hypoxemic respiratory failure (HCC)   Sepsis (HCC)  Sepsis and acute hypoxic respiratory secondary to community-acquired pneumonia: Feeling better, weaned to room air, saturating over 90% at rest.  Continue cefepime .  Acute on chronic congestive heart failure with preserved ejection fraction: Last night, patient was complaining of shortness of breath and cough, chest x-ray was obtained which showed pulmonary edema.  Patient's BNP upon arrival was slightly elevated than his baseline up to 927 however his baseline appears to be around 600.  Initial chest x-ray  did not show pulmonary edema though.  On examination 09/13/2023, he appeared to have crackles but he denied any shortness of breath.  We checked his BNP which had jumped to 1700.  He appears to be taking Lasix  40 mg p.o. daily PTA which was on hold for some reason.  Started on 40 mg IV Lasix  twice daily.  Looks like he has had significant diuresis of 3.5 L in last 24 hours.  Patient again denies any shortness of breath.  However reportedly, son told the nurses last night that he was having shortness of breath and son wanted the father to be on oxygen  despite of no requirement since his oxygen  saturation was 94% on room air.  He still has crackles but much improved compared to yesterday.  Still needs more IV diuresis.  I am hoping that he will be tuned up and well enough to discharge tomorrow.  I discussed potential plan of discharge tomorrow with the son at the bedside and asked him to prepare discharge for tomorrow.  UTI: Urine cultures growing Pseudomonas and Klebsiella.  Patient remains on cefepime  for that.  Hypomagnesemia.  Low again, will replenish.  Hypokalemia: Will replenish.  Hyponatremia: Mild and improving.  Patient asymptomatic.   Hyperlipidemia, unspecified Continue Crestor                 BPH Continue Flomax      Permanent A fib  continue metoprolol  and Eliquis      Essential hypertension Blood pressure controlled, continue metoprolol    Elevated MCV, chronic Thrombocytopenia No evidence of bleeding.  Will continue to monitor     COPD (chronic obstructive pulmonary disease) Continue DuoNebs, stable.     Dementia without behavioral disturbance  Continue supportive care.  He is at baseline.   Debility deconditioning seen with therapy and recommend home health PT on discharge.     DVT prophylaxis: apixaban  (ELIQUIS ) tablet 2.5 mg Start: 09/09/23 2300 apixaban  (ELIQUIS ) tablet 2.5 mg   DVT prophylaxis: apixaban  (ELIQUIS ) tablet 2.5 mg Start: 09/09/23 2300   Code Status:  Limited: Do not attempt resuscitation (DNR) -DNR-LIMITED -Do Not Intubate/DNI   Family Communication: Son and daughter present at bedside.  Plan of care discussed in length.  Status is: Inpatient Remains inpatient appropriate because: Needs IV diuresis.   Estimated body mass index is 23.59 kg/m as calculated from the following:   Height as of this encounter: 5' 2 (1.575 m).   Weight as of this encounter: 58.5 kg.    Nutritional Assessment: Body mass index is 23.59 kg/m.[name] Seen by dietician.  I agree with the assessment and plan as outlined below: Nutrition Status:        . Skin Assessment: I have examined the patient's skin and I agree with the wound assessment as performed by the wound care RN as outlined below:    Consultants:  None  Procedures:  None  Antimicrobials:  Anti-infectives (From admission, onward)    Start     Dose/Rate Route Frequency Ordered Stop   09/15/23 0015  ceFEPIme  (MAXIPIME ) 1 g in sodium chloride  0.9 % 100 mL IVPB  Status:  Discontinued        1 g 200 mL/hr over 30 Minutes Intravenous Every 24 hours 09/14/23 1011 09/14/23 1012   09/15/23 0015  ceFEPIme  (MAXIPIME ) 2 g in sodium chloride  0.9 % 100 mL IVPB        2 g 200 mL/hr over 30 Minutes Intravenous Every 24 hours 09/14/23 1012     09/12/23 1215  ceFEPIme  (MAXIPIME ) 2 g in sodium chloride  0.9 % 100 mL IVPB  Status:  Discontinued        2 g 200 mL/hr over 30 Minutes Intravenous Every 12 hours 09/12/23 1127 09/14/23 1011   09/12/23 1215  metroNIDAZOLE  (FLAGYL ) IVPB 500 mg        500 mg 100 mL/hr over 60 Minutes Intravenous Every 12 hours 09/12/23 1127     09/09/23 2300  Ampicillin-Sulbactam (UNASYN) 3 g in sodium chloride  0.9 % 100 mL IVPB  Status:  Discontinued        3 g 200 mL/hr over 30 Minutes Intravenous Every 6 hours 09/09/23 2259 09/12/23 1127   09/09/23 2030  cefTRIAXone  (ROCEPHIN ) 1 g in sodium chloride  0.9 % 100 mL IVPB        1 g 200 mL/hr over 30 Minutes Intravenous  Once  09/09/23 2024 09/09/23 2155   09/09/23 2030  azithromycin  (ZITHROMAX ) 500 mg in sodium chloride  0.9 % 250 mL IVPB        500 mg 250 mL/hr over 60 Minutes Intravenous  Once 09/09/23 2024 09/09/23 2300         Subjective: Seen and examined.  Patient very hard of hearing.  Denies any shortness of breath or any other complaint.  Son at the bedside as usual.  Objective: Vitals:   09/13/23 2222 09/14/23 0519 09/14/23 0820 09/14/23 0832  BP:  111/63  123/68  Pulse:  74 74 80  Resp:  17 17   Temp:  98.3 F (36.8 C)    TempSrc:  Oral    SpO2: 96% 97% 97% 99%  Weight:      Height:        Intake/Output Summary (Last 24 hours)  at 09/14/2023 1058 Last data filed at 09/14/2023 0500 Gross per 24 hour  Intake 803.73 ml  Output 4275 ml  Net -3471.27 ml   Filed Weights   09/09/23 1757  Weight: 58.5 kg    Examination:  General exam: Appears calm and comfortable  Respiratory system: Crackles bilaterally but improved.[name] Respiratory effort normal. Cardiovascular system: S1 & S2 heard, RRR. No JVD, murmurs, rubs, gallops or clicks. No pedal edema. Gastrointestinal system: Abdomen is nondistended, soft and nontender. No organomegaly or masses felt. Normal bowel sounds heard. Central nervous system: Alert and oriented. No focal neurological deficits. Extremities: Symmetric 5 x 5 power. Skin: No rashes, lesions or ulcers.   Data Reviewed: I have personally reviewed following labs and imaging studies  CBC: Recent Labs  Lab 09/09/23 1808 09/10/23 0224 09/11/23 0610 09/13/23 1023 09/14/23 0628  WBC 15.8* 11.6* 7.9 9.6 6.2  NEUTROABS  --   --   --  8.2* 4.1  HGB 10.1* 9.4* 8.3* 9.1* 9.1*  HCT 29.6* 28.5* 24.8* 27.5* 27.2*  MCV 100.3* 103.6* 102.9* 101.9* 100.7*  PLT 82* 61* 58* 61* 59*   Basic Metabolic Panel: Recent Labs  Lab 09/09/23 2100 09/10/23 0224 09/10/23 1356 09/11/23 0610 09/12/23 0609 09/13/23 0631 09/13/23 1023 09/14/23 0628  NA  --    < > 139 135 134* 134*  --   134*  K  --    < > 2.8* 3.9 3.9 3.7  --  3.2*  CL  --    < > 107 109 106 108  --  106  CO2  --    < > 19* 19* 20* 18*  --  20*  GLUCOSE  --    < > 175* 114* 99 102*  --  112*  BUN  --    < > 18 13 11 14   --  18  CREATININE  --    < > 1.34* 0.94 0.90 1.08  --  1.27*  CALCIUM   --    < > 7.2* 7.6* 7.7* 7.9*  --  8.0*  MG 1.7  --   --   --  1.6*  --  1.7 1.6*   < > = values in this interval not displayed.   GFR: Estimated Creatinine Clearance: 27.5 mL/min (A) (by C-G formula based on SCr of 1.27 mg/dL (H)). Liver Function Tests: Recent Labs  Lab 09/10/23 0224  AST 16  ALT 17  ALKPHOS 55  BILITOT 1.0  PROT 5.9*  ALBUMIN 2.6*   No results for input(s): LIPASE, AMYLASE in the last 168 hours. No results for input(s): AMMONIA in the last 168 hours. Coagulation Profile: No results for input(s): INR, PROTIME in the last 168 hours. Cardiac Enzymes: No results for input(s): CKTOTAL, CKMB, CKMBINDEX, TROPONINI in the last 168 hours. BNP (last 3 results) No results for input(s): PROBNP in the last 8760 hours. HbA1C: No results for input(s): HGBA1C in the last 72 hours. CBG: No results for input(s): GLUCAP in the last 168 hours. Lipid Profile: No results for input(s): CHOL, HDL, LDLCALC, TRIG, CHOLHDL, LDLDIRECT in the last 72 hours. Thyroid  Function Tests: No results for input(s): TSH, T4TOTAL, FREET4, T3FREE, THYROIDAB in the last 72 hours. Anemia Panel: No results for input(s): VITAMINB12, FOLATE, FERRITIN, TIBC, IRON , RETICCTPCT in the last 72 hours. Sepsis Labs: No results for input(s): PROCALCITON, LATICACIDVEN in the last 168 hours.  Recent Results (from the past 240 hours)  Urine Culture     Status: Abnormal   Collection Time:  09/09/23  8:10 PM   Specimen: Urine, Clean Catch  Result Value Ref Range Status   Specimen Description URINE, CLEAN CATCH  Final   Special Requests   Final    NONE Performed at [facility], [address], [place], [place] [address]    Culture (A)  Final    >=100,000 COLONIES/mL PSEUDOMONAS AERUGINOSA 20,000 COLONIES/mL KLEBSIELLA PNEUMONIAE    Report Status 09/13/2023 FINAL  Final   Organism ID, Bacteria PSEUDOMONAS AERUGINOSA (A)  Final   Organism ID, Bacteria KLEBSIELLA PNEUMONIAE (A)  Final      Susceptibility   Klebsiella pneumoniae - MIC*    AMPICILLIN >=32 RESISTANT Resistant     CEFAZOLIN  <=4 SENSITIVE Sensitive     CEFEPIME  <=0.12 SENSITIVE Sensitive     CEFTRIAXONE  <=0.25 SENSITIVE Sensitive     CIPROFLOXACIN  <=0.25 SENSITIVE Sensitive     GENTAMICIN  <=1 SENSITIVE Sensitive     IMIPENEM <=0.25 SENSITIVE Sensitive     NITROFURANTOIN 128 RESISTANT Resistant     TRIMETH /SULFA  <=20 SENSITIVE Sensitive     AMPICILLIN/SULBACTAM >=32 RESISTANT Resistant     PIP/TAZO 16 SENSITIVE Sensitive ug/mL    * 20,000 COLONIES/mL KLEBSIELLA PNEUMONIAE   Pseudomonas aeruginosa - MIC*    CEFTAZIDIME 2 SENSITIVE Sensitive     CIPROFLOXACIN  <=0.25 SENSITIVE Sensitive     GENTAMICIN  <=1 SENSITIVE Sensitive     IMIPENEM 0.5 SENSITIVE Sensitive     PIP/TAZO 8 SENSITIVE Sensitive ug/mL    CEFEPIME  2 SENSITIVE Sensitive     * >=100,000 COLONIES/mL PSEUDOMONAS AERUGINOSA  SARS Coronavirus 2 by RT PCR (hospital order, performed in [facility] hospital lab) *cepheid single result test* Anterior Nasal Swab     Status: None   Collection Time: 09/10/23  4:53 AM   Specimen: Anterior Nasal Swab  Result Value Ref Range Status   SARS Coronavirus 2 by RT PCR NEGATIVE NEGATIVE Final    Comment: Performed at [facility], [address] [place], [place] [address]  Respiratory (~20 pathogens) panel by PCR     Status: None   Collection Time: 09/10/23  4:53 AM   Specimen: Anterior Nasal Swab; Respiratory  Result Value Ref Range Status   Adenovirus NOT DETECTED NOT DETECTED Final   Coronavirus 229E NOT DETECTED NOT DETECTED Final    Comment: (NOTE) The Coronavirus on the  Respiratory Panel, DOES NOT test for the novel  Coronavirus (2019 nCoV)    Coronavirus HKU1 NOT DETECTED NOT DETECTED Final   Coronavirus NL63 NOT DETECTED NOT DETECTED Final   Coronavirus OC43 NOT DETECTED NOT DETECTED Final   Metapneumovirus NOT DETECTED NOT DETECTED Final   Rhinovirus / Enterovirus NOT DETECTED NOT DETECTED Final   Influenza A NOT DETECTED NOT DETECTED Final   Influenza B NOT DETECTED NOT DETECTED Final   Parainfluenza Virus 1 NOT DETECTED NOT DETECTED Final   Parainfluenza Virus 2 NOT DETECTED NOT DETECTED Final   Parainfluenza Virus 3 NOT DETECTED NOT DETECTED Final   Parainfluenza Virus 4 NOT DETECTED NOT DETECTED Final   Respiratory Syncytial Virus NOT DETECTED NOT DETECTED Final   Bordetella pertussis NOT DETECTED NOT DETECTED Final   Bordetella Parapertussis NOT DETECTED NOT DETECTED Final   Chlamydophila pneumoniae NOT DETECTED NOT DETECTED Final   Mycoplasma pneumoniae NOT DETECTED NOT DETECTED Final    Comment: Performed at [facility], [address], [place], [place] [address]     Radiology Studies: DG CHEST PORT 1 VIEW Result Date: 09/13/2023 CLINICAL DATA:  Sudden onset shortness of breath EXAM: PORTABLE CHEST 1 VIEW COMPARISON:  09/10/2023 FINDINGS: Cardiac shadow is prominent but stable. Postsurgical changes are noted. Aortic calcifications are seen. Slight increase in the degree of interstitial changes are noted consistent with worsening edema. No focal confluent infiltrate or effusion is noted. IMPRESSION: Worsening edema. Electronically Signed   By: Violeta Grey M.D.   On: 09/13/2023 00:26    Scheduled Meds:  apixaban   2.5 mg Oral BID   Chlorhexidine  Gluconate Cloth  6 each Topical Daily   ferrous sulfate   325 mg Oral Daily   FLUoxetine   20 mg Oral Daily   fluticasone  furoate-vilanterol  1 puff Inhalation Daily   folic acid   1 mg Oral Daily   furosemide   40 mg Intravenous BID   metoprolol  tartrate  25 mg Oral Daily   multivitamin with  minerals  1 tablet Oral Daily   mouth rinse  15 mL Mouth Rinse 4 times per day   pantoprazole   40 mg Oral q morning   rosuvastatin   5 mg Oral QHS   tamsulosin   0.4 mg Oral QHS   Continuous Infusions:  [START ON 09/15/2023] ceFEPime  (MAXIPIME ) IV     metronidazole  Stopped (09/14/23 0144)     LOS: 4 days   Modena Andes, MD [facility]  09/14/2023, 10:58 AM   *Please note that this is a verbal dictation therefore any spelling or grammatical errors are due to the Dragon Medical One system interpretation.  Please page via Amion and do not message via secure chat for urgent patient care matters. Secure chat can be used for non urgent patient care matters.  How to contact the [facility] Attending or Consulting provider 7A - 7P or covering provider during after hours 7P -7A, for this patient?  Check the care team in [facility] and look for a) attending/consulting TR[facility] provider listed and b) the [facility] team listed. Page or secure chat 7A-7P. Log into [email or web address] and use [place]'s universal password to access. If you do not have the password, please contact the hospital operator. Locate the [facility] provider you are looking for under [facility] and page to a number that you can be directly reached. If you still have difficulty reaching the provider, please page the [facility] (Director on Call) for the Hospitalists listed on amion for assistance.

## 2023-09-14 NOTE — Progress Notes (Signed)
 Pt pulled up in bed to eat her family request

## 2023-09-15 ENCOUNTER — Encounter: Payer: Self-pay | Admitting: *Deleted

## 2023-09-15 ENCOUNTER — Other Ambulatory Visit: Payer: Self-pay | Admitting: Family Medicine

## 2023-09-15 ENCOUNTER — Telehealth: Payer: Self-pay | Admitting: *Deleted

## 2023-09-15 DIAGNOSIS — J690 Pneumonitis due to inhalation of food and vomit: Secondary | ICD-10-CM | POA: Diagnosis not present

## 2023-09-15 LAB — BASIC METABOLIC PANEL WITH GFR
Anion gap: 8 (ref 5–15)
BUN: 20 mg/dL (ref 8–23)
CO2: 20 mmol/L — ABNORMAL LOW (ref 22–32)
Calcium: 8.2 mg/dL — ABNORMAL LOW (ref 8.9–10.3)
Chloride: 107 mmol/L (ref 98–111)
Creatinine, Ser: 1.23 mg/dL (ref 0.61–1.24)
GFR, Estimated: 54 mL/min — ABNORMAL LOW (ref >60)
Glucose, Bld: 114 mg/dL — ABNORMAL HIGH (ref 70–99)
Potassium: 3.9 mmol/L (ref 3.5–5.1)
Sodium: 135 mmol/L (ref 135–145)

## 2023-09-15 MED ORDER — HYDROXYZINE HCL 10 MG PO TABS
10.0000 mg | ORAL_TABLET | Freq: Once | ORAL | Status: AC
  Administered 2023-09-15: 10 mg via ORAL
  Filled 2023-09-15: qty 1

## 2023-09-15 NOTE — Transitions of Care (Post Inpatient/ED Visit) (Signed)
 09/15/2023  Patient ID: Evan [name], male   DOB: 1928-02-02, 89 y.o.   MRN: [id number]  Patient hospitalized 09/09/23, care plan and [facility] program closed, today's scheduled appointment cancelled.  Cecilie Coffee [facility], BSN RN Care Manager/ Transition of Care [place]/ [facility] [phone]

## 2023-09-15 NOTE — Progress Notes (Signed)
 Mobility Specialist Progress Note:  Nurse requested Mobility Specialist to perform oxygen  saturation test with pt which includes removing pt from oxygen  both at rest and while ambulating.  Below are the results from that testing.     Patient Saturations on Room Air at Rest = spO2 100%  Patient Saturations on Room Air while Ambulating = sp02 88% .  Rested and performed pursed lip breathing for 1 minute with sp02 at 97%.  At end of testing pt left in room on 0  Liters of oxygen .  Reported results to nurse.    Inetta Manes Mobility Specialist  Please contact vis Secure Chat or  Rehab Office [phone]

## 2023-09-15 NOTE — Progress Notes (Signed)
 Mobility Specialist Progress Note:    09/15/23 1200  Mobility  Activity Ambulated with assistance in room;Transferred from bed to chair  Level of Assistance Minimal assist, patient does 75% or more (+2)  Assistive Device Other (Comment) (HHA)  Distance Ambulated (ft) 4 ft  Activity Response Tolerated well  Mobility Referral Yes  Mobility visit 1 Mobility  Mobility Specialist Start Time (ACUTE ONLY) 1135  Mobility Specialist Stop Time (ACUTE ONLY) 1153  Mobility Specialist Time Calculation (min) (ACUTE ONLY) 18 min   Pt received in bed agreeable to mobility. Pt required ModA to get to EOB and MinA +2 for STS. Upon standing pt had BM incontinence. Pt required x3 STS to perform pericare. Able to participate in mobility on RA SPO2 stayed [facility]. Once clean pt was able to take a couple steps towards the chair w/o fault. Left in chair w/ call bell and personal belongings in reach. All needs met. Family in room.  Inetta Manes Mobility Specialist  Please contact vis Secure Chat or  Rehab Office [phone]

## 2023-09-15 NOTE — TOC Transition Note (Signed)
 Transition of Care [facility]) - Discharge Note   Patient Details  Name: Evan [name] MRN: [id number] Date of Birth: 08/29/1927  Transition of Care [facility]) CM/SW Contact:  Jonathan Neighbor, RN Phone Number: 09/15/2023, 11:09 AM   Clinical Narrative:     Pt is discharging home with home health services through [facility]. Information on the AVS. Gasper Karst will contact him for the first home visit. Wheelchair through [employer] once son works it out with insurance.  Family providing transportation home.   Final next level of care: Home w Home Health Services Barriers to Discharge: No Barriers Identified   Patient Goals and CMS Choice Patient states their goals for this hospitalization and ongoing recovery are:: patient asleep , son B Kenderick Kobler wants to tajke home at discharge with [facility]   Choice offered to / list presented to : Adult Children [employer] ownership interest in [facility].provided to:: Adult Children    Discharge Placement                       Discharge Plan and Services Additional resources added to the After Visit Summary for     Discharge Planning Services: CM Consult Post Acute Care Choice: Home Health          DME Arranged: Wheelchair manual DME Agency: [employer] Date DME Agency Contacted: 09/11/23 Time DME Agency Contacted: 1335 Representative spoke with at DME Agency: Raechel Bulla HH Arranged: RN, PT, OT [facility] Agency: [facility] Date [facility] Agency Contacted: 09/11/23 Time HH Agency Contacted: 1336 Representative spoke with at [facility] Agency: Randel Buss  Social Drivers of Health (SDOH) Interventions SDOH Screenings   Food Insecurity: No Food Insecurity (09/10/2023)  Housing: Low Risk  (09/10/2023)  Transportation Needs: No Transportation Needs (09/10/2023)  Utilities: Not At Risk (09/10/2023)  Alcohol  Screen: Low Risk  (04/20/2023)  Depression (PHQ2-9): High Risk (07/10/2023)  Financial Resource Strain: Low Risk  (04/20/2023)  Physical Activity:  Insufficiently Active (04/20/2023)  Social Connections: Socially Isolated (09/10/2023)  Stress: No Stress Concern Present (04/20/2023)  Tobacco Use: Medium Risk (09/09/2023)  Health Literacy: Inadequate Health Literacy (04/20/2023)     Readmission Risk Interventions    08/15/2023    4:46 PM 03/10/2023    2:24 PM 05/17/2022    1:25 PM  Readmission Risk Prevention Plan  Transportation Screening Complete Complete Complete  PCP or Specialist Appt within 3-5 Days Complete    HRI or Home Care Consult Complete    Palliative Care Screening Not Applicable    Medication Review (RN Care Manager) Complete Complete Complete  PCP or Specialist appointment within 3-5 days of discharge  Complete Complete  HRI or Home Care Consult  Complete Complete  SW Recovery Care/Counseling Consult  Complete   Palliative Care Screening  Not Applicable   Skilled Nursing Facility  Not Applicable

## 2023-09-15 NOTE — Discharge Summary (Signed)
 Physician Discharge Summary  Evan [name] ZOX:[id number] DOB: [birth year] DOA: 09/09/2023  PCP: Austine Lefort, MD  Admit date: 09/09/2023 Discharge date: 09/15/2023 30 Day Unplanned Readmission Risk Score    Flowsheet Row ED to Hosp-Admission (Current) from 09/09/2023 in [facility]  SURGICAL  30 Day Unplanned Readmission Risk Score (%) 34.44 Filed at 09/15/2023 0801    This score is the patient's risk of an unplanned readmission within 30 days of being discharged (0 -100%). The score is based on dignosis, age, lab data, medications, orders, and past utilization.   Low:  0-14.9   Medium: 15-21.9   High: 22-29.9   Extreme: 30 and above          Admitted From: Home Disposition: Home  Recommendations for Outpatient Follow-up:  Follow up with PCP in 1-2 weeks Please obtain BMP/CBC in one week Please follow up with your PCP on the following pending results: Unresulted Labs (From admission, onward)    None         Home Health: Yes Equipment/Devices: None  Discharge Condition: Stable CODE STATUS: DNR Diet recommendation: Cardiac  Subjective: Seen and examined, daughter and son at the bedside.  He has no complaints.  Denied any shortness of breath.  Brief/Interim Summary: Evan [name] is a 89 y.o. male with past medical history significant of dementia, CAD status post CABG, HFpEF, CVA, hypertension, hyperlipidemia, CKD stage IIIa, COPD, PVD, history of PE on Eliquis , lung cancer, anxiety, BPH with chronic Foley, depression, esophageal ulcer, GERD, hemochromatosis, rheumatoid arthritis, chronic low back pain, chronic anemia and thrombocytopenia with recent admission 5/11-5/19 for metapneumovirus pneumonia and heart failure exacerbation, admitted again 5/21-5/22 for altered mental status and right upper extremity/hand cellulitis.  Patient presented at this time to the ED with shortness of breath and feeling of not feeling well overall.  In the ED patient was  given supplemental oxygen  and Foley catheter was exchanged and was admitted to the hospital for following issues.   Sepsis and acute hypoxic respiratory secondary to community-acquired pneumonia: Feeling better, weaned to room air, saturating over 90% at rest.  Received 7 days of antibiotics.  No further antibiotics at discharge.   Acute on chronic congestive heart failure with preserved ejection fraction: On the night of 09/12/2023, patient was complaining of shortness of breath and cough, chest x-ray was obtained which showed pulmonary edema.  Patient's BNP upon arrival was slightly elevated than his baseline up to 927 however his baseline appears to be around 600.  Initial chest x-ray did not show pulmonary edema though.  On examination 09/13/2023, he appeared to have crackles but he denied any shortness of breath.  We checked his BNP which had jumped to 1700.  He appears to be taking Lasix  40 mg p.o. daily PTA which was on hold for some reason.  Started on 40 mg IV Lasix  twice daily.  Patient had significant diuresis in last 2 to 3 days.  He has chronic coarse breath sounds however they are improved from 2 days ago.  He has not required any oxygen  for 2 to 3 days.  Discussed with son and daughter at the bedside and since patient is stable, they are in agreement with going home.   UTI: Urine cultures growing Pseudomonas and Klebsiella.  Patient received 4 days of cefepime .  However prior to that he received 2 days of Unasyn  and 1 day of Rocephin .  He is allergic to fluoroquinolone which is the only oral antibiotic that shows  sensitivity to both bacteria's.  Since he received 7 days of antibiotics, I do not think any further antibiotics at discharge are needed.   Hypomagnesemia.  Replenished   Hypokalemia: Resolved   Hyponatremia: Normalized   Hyperlipidemia, unspecified Continue Crestor                 BPH Continue Flomax      Permanent A fib  continue metoprolol  and Eliquis      Essential  hypertension Blood pressure controlled, continue metoprolol    Elevated MCV, chronic Thrombocytopenia No evidence of bleeding.  Will continue to monitor     COPD (chronic obstructive pulmonary disease) Continue DuoNebs, stable.     Dementia without behavioral disturbance  Continue supportive care.  He is at baseline.   Debility deconditioning seen with therapy and recommend home health PT on discharge.   Discharge plan was discussed with patient and/or family member and they verbalized understanding and agreed with it.  Discharge Diagnoses:  Principal Problem:   Aspiration pneumonia (HCC) Active Problems:   Hyperlipidemia, unspecified   Permanent atrial fibrillation (HCC)   Hypokalemia   Essential hypertension   Benign prostatic hyperplasia without lower urinary tract symptoms   COPD (chronic obstructive pulmonary disease) (HCC)   Dementia without behavioral disturbance (HCC)   Thrombocytopenia (HCC)   Hyponatremia   Acute hypoxemic respiratory failure (HCC)   Sepsis (HCC)    Discharge Instructions   Allergies as of 09/15/2023       Reactions   Feldene [piroxicam] Rash   Blistering rash   Neomycin -polymyxin-hc Itching   Statins Other (See Comments)   Myalgias   Latex Rash   Band Aid   Tequin [gatifloxacin] Swelling, Anxiety   Valium [diazepam] Other (See Comments)   Dizziness   Vibramycin  [doxycycline ] Itching, Nausea Only        Medication List     STOP taking these medications    predniSONE  20 MG tablet Commonly known as: DELTASONE        TAKE these medications    acetaminophen  325 MG tablet Commonly known as: TYLENOL  Take 325-650 mg by mouth 2 (two) times daily as needed for mild pain (pain score 1-3), moderate pain (pain score 4-6), fever or headache.   apixaban  2.5 MG Tabs tablet Commonly known as: Eliquis  Take 1 tablet (2.5 mg total) by mouth 2 (two) times daily.   FeroSul 325 (65 Fe) MG tablet Generic drug: ferrous sulfate  TAKE 1 TABLET  EVERY DAY   FLUoxetine  20 MG capsule Commonly known as: PROZAC  TAKE 1 CAPSULE EVERY DAY   folic acid  1 MG tablet Commonly known as: FOLVITE  Take 1 tablet (1 mg total) by mouth daily.   furosemide  40 MG tablet Commonly known as: LASIX  TAKE 1 TABLET EVERY DAY   hydrOXYzine  10 MG/5ML syrup Commonly known as: ATARAX  Take 5 mLs (10 mg total) by mouth every 8 (eight) hours as needed for itching.   melatonin 3 MG Tabs tablet Take 1 tablet (3 mg total) by mouth at bedtime as needed (insomnia).   Mens 50+ Multivitamin Tabs Take 1 tablet by mouth daily.   metoprolol  tartrate 25 MG tablet Commonly known as: LOPRESSOR  Take 25 mg by mouth daily.   mupirocin  ointment 2 % Commonly known as: BACTROBAN  Apply 1 Application topically 2 (two) times daily.   nitroGLYCERIN  0.4 MG SL tablet Commonly known as: NITROSTAT  Place 1 tablet (0.4 mg total) under the tongue every 5 (five) minutes as needed for chest pain.   nystatin  powder Commonly known as: MYCOSTATIN /NYSTOP  Apply  a thin layer topically to the affected area 2-3 (two to three) times daily.   pantoprazole  40 MG tablet Commonly known as: PROTONIX  TAKE 1 TABLET EVERY MORNING   rosuvastatin  5 MG tablet Commonly known as: CRESTOR  Take 1 tablet (5 mg total) by mouth at bedtime.   senna-docusate 8.6-50 MG tablet Commonly known as: Stimulant Laxative Take 2 tablets by mouth 2 (two) times daily as needed for constipation   Symbicort  160-4.5 MCG/ACT inhaler Generic drug: budesonide -formoterol  Inhale 2 puffs into the lungs in the morning and at bedtime.   tamsulosin  0.4 MG Caps capsule Commonly known as: FLOMAX  Take 1 capsule (0.4 mg total) by mouth daily with supper. What changed: when to take this   Zinc  Oxide 12.8 % ointment Commonly known as: TRIPLE PASTE Apply topically 4 (four) times daily as needed (skin barrier). What changed:  how much to take when to take this               Durable Medical Equipment  (From  admission, onward)           Start     Ordered   09/11/23 1335  For home use only DME standard manual wheelchair with seat cushion  Once       Comments: Patient suffers from  recent decline in their functional status, COPD, HF, dementia, CVA, aspiration pneumonia,  which impairs their ability to perform daily activities like ambulating  in the home.  A cane  will not resolve issue with performing activities of daily living. A wheelchair will allow patient to safely perform daily activities. Patient can safely propel the wheelchair in the home or has a caregiver who can provide assistance. Length of need lifetime . Accessories: elevating leg rests (ELRs), wheel locks, extensions and anti-tippers.  Seat and back cushions  Call son B.Clara Nohr [phone] with cost before delivery thanks   09/11/23 1335            Follow-up Information     [facility] Follow up.   Specialty: Home Health Services Contact information: [address] [place] [address] [phone]         Austine Lefort, MD Follow up in 1 week(s).   Specialty: Family Medicine Contact information: [address] [place] [address] [phone]         [facility]  Follow up.   Why: [employer]--Company to provide wheelchair Contact information: [address] [place] [address] [phone]                Allergies  Allergen Reactions   Feldene [Piroxicam] Rash    Blistering rash     Neomycin -Polymyxin-Hc Itching   Statins Other (See Comments)    Myalgias   Latex Rash    Band Aid   Tequin [Gatifloxacin] Swelling and Anxiety   Valium [Diazepam] Other (See Comments)    Dizziness    Vibramycin  [Doxycycline ] Itching and Nausea Only    Consultations: None   Procedures/Studies: DG CHEST PORT 1 VIEW Result Date: 09/13/2023 CLINICAL DATA:  Sudden onset shortness of breath EXAM: PORTABLE CHEST 1 VIEW COMPARISON:  09/10/2023  FINDINGS: Cardiac shadow is prominent but stable. Postsurgical changes are noted. Aortic calcifications are seen. Slight increase in the degree of interstitial changes are noted consistent with worsening edema. No focal confluent infiltrate or effusion is noted. IMPRESSION: Worsening edema. Electronically Signed   By: Violeta Grey M.D.   On: 09/13/2023 00:26  DG Swallowing Func-Speech Pathology Result Date: 09/10/2023 Table formatting from the original result was not included. Modified Barium Swallow Study Patient Details Name: JACORIAN GOLASZEWSKI MRN: [id number] Date of Birth: 1927/04/21 Today's Date: 09/10/2023 HPI/PMH: HPI: ABELINO TIPPIN is a 89 y.o. male with medical history significant of dementia, CAD status post CABG, HFpEF, CVA, hypertension, hyperlipidemia, CKD stage IIIa, COPD, PVD, history of PE on Eliquis , lung cancer, anxiety, BPH with chronic Foley, depression, esophageal ulcer, GERD, hemochromatosis, rheumatoid arthritis, chronic low back pain, chronic anemia and thrombocytopenia.  Recent admission 5/11-5/19 for metapneumovirus pneumonia and heart failure exacerbation.  Admitted again 5/21-5/22 for altered mental status and right upper extremity/hand cellulitis, chest congestion, SOB. Dx with acute hypoxemic respiratory failure secondary to aspiration PNA. Patient with h/o dysphagia, multiple MBS since 2020. Study in 2020 and 2024 recommending either nectar thick or thin liquids,  study complete 08/2023 recommending regular solids with nectar thick liquids due to aspiration of thin liquids. Clinical Impression: Clinical Impression: Patient presents with an age appropriate, functional oropharyngeal swallow based on today's assessment. Oral phase appropriate with timely mastication and full oral clearance. Swallow initiated at the level of the vallecula with all boluses with the acception of thin liquids which were intermittently initiated at the pyriform sinuses resulting in flash penetration of thin liquids  which is considered normal for age. No aspiration observed, even when challenged with large consecutive boluses via straw. Most significant finding was notable stasis througout esophagus, only post intake of previously noted large serial swallows of thin liquid. Liquid did not clear independently over approximately 60 seconds. Given bite of pureed solid which quickly pushed entire contents of esophagus into the stomach. Subseqent single small sips of thin liquid passed quickly. 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 Defer further w/u to MD. Education complete with son who was present for the study. Suspect that this patient, with advanced age, h/o CVA, and fluctuating dysphagia documented for at least 5 years, will continue to have fluctuating swallowing abilities with chronic risk of aspiration, exacerbated by suspected esophageal deficits. Recommending thin liquids at this time. SLP will f/u to reinforce use of precautions. Factors that may increase risk of adverse event in presence of aspiration Roderick Civatte & Jessy Morocco 2021): Factors that may increase risk of adverse event in presence of aspiration Roderick Civatte & Jessy Morocco 2021): Respiratory or GI disease; Limited mobility Recommendations/Plan: Swallowing Evaluation Recommendations Swallowing Evaluation Recommendations Recommendations: PO diet PO Diet Recommendation: Regular; Thin liquids (Level 0) Liquid Administration via: Cup; Straw Medication Administration: Whole meds with puree Supervision: Patient able to self-feed; Full supervision/cueing for swallowing strategies Swallowing strategies  : Slow rate; Small bites/sips (single sips) Postural changes: Stay upright 30-60 min after meals; Position pt fully upright for meals Oral care recommendations: Oral care BID (2x/day) Recommended consults: Consider esophageal assessment Treatment Plan Treatment Plan Treatment recommendations: Therapy as outlined in treatment plan below Follow-up recommendations: Home health SLP  Functional status assessment: Patient has had a recent decline in their functional status and demonstrates the ability to make significant improvements in function in a reasonable and predictable amount of time. Treatment frequency: Min 1x/week Treatment duration: 1 week Interventions: Aspiration precaution training; Compensatory techniques; Patient/family education; Diet toleration management by SLP Recommendations Recommendations for follow up therapy are one component of a multi-disciplinary discharge planning process, led by the attending physician.  Recommendations may be updated based on patient status, additional functional criteria and insurance authorization. Assessment: Orofacial Exam: Orofacial Exam Oral Cavity: Oral Hygiene: WFL Oral Cavity - Dentition: Adequate natural  dentition Orofacial Anatomy: WFL Oral Motor/Sensory Function: Suspected cranial nerve impairment CN VII - Facial: Right motor impairment (baseline) Anatomy: Anatomy: Other (Comment) (what appears to be calcification along posterior pharyhngeal wall, above UES again noted, unchanged from previous studies) Boluses Administered: Boluses Administered Boluses Administered: Thin liquids (Level 0); Mildly thick liquids (Level 2, nectar thick); Puree; Solid  Oral Impairment Domain: Oral Impairment Domain Lip Closure: Escape beyond mid-chin Tongue control during bolus hold: Cohesive bolus between tongue to palatal seal Bolus preparation/mastication: Slow prolonged chewing/mashing with complete recollection Bolus transport/lingual motion: Brisk tongue motion Initiation of pharyngeal swallow : Pyriform sinuses  Pharyngeal Impairment Domain: Pharyngeal Impairment Domain Soft palate elevation: No bolus between soft palate (SP)/pharyngeal wall (PW) Laryngeal elevation: Complete superior movement of thyroid  cartilage with complete approximation of arytenoids to epiglottic petiole Anterior hyoid excursion: Complete anterior movement Epiglottic movement:  Complete inversion Laryngeal vestibule closure: Incomplete, narrow column air/contrast in laryngeal vestibule Pharyngeal stripping wave : Present - complete Pharyngeal contraction (A/P view only): N/A Pharyngoesophageal segment opening: Complete distension and complete duration, no obstruction of flow Tongue base retraction: No contrast between tongue base and posterior pharyngeal wall (PPW) Pharyngeal residue: Complete pharyngeal clearance  Esophageal Impairment Domain: Esophageal Impairment Domain Esophageal clearance upright position: Esophageal retention Pill: Pill Consistency administered: Mildly thick liquids (Level 2, nectar thick) Mildly thick liquids (Level 2, nectar thick): WFL Penetration/Aspiration Scale Score: Penetration/Aspiration Scale Score 1.  Material does not enter airway: Mildly thick liquids (Level 2, nectar thick); Puree; Solid; Pill 2.  Material enters airway, remains ABOVE vocal cords then ejected out: Thin liquids (Level 0) Compensatory Strategies: No data recorded  General Information: Caregiver present: Yes  Diet Prior to this Study: NPO   Temperature : Normal   Respiratory Status: WFL   Supplemental O2: Nasal cannula   History of Recent Intubation: No  Behavior/Cognition: Alert; Cooperative; Pleasant mood; Requires cueing Self-Feeding Abilities: Able to self-feed Baseline vocal quality/speech: Dysphonic Volitional Cough: Able to elicit Volitional Swallow: Able to elicit No data recorded Goal Planning: Prognosis for improved oropharyngeal function: Good No data recorded No data recorded No data recorded Consulted and agree with results and recommendations: Patient; Family member/caregiver Pain: Pain Assessment Pain Assessment: No/denies pain End of Session: Start Time:SLP Start Time (ACUTE ONLY): 0935 Stop Time: SLP Stop Time (ACUTE ONLY): 0955 Time Calculation:SLP Time Calculation (min) (ACUTE ONLY): 20 min Charges: SLP Evaluations $ SLP Speech Visit: 1 Visit SLP Evaluations $BSS  Swallow: 1 Procedure $MBS Swallow: 1 Procedure SLP visit diagnosis: SLP Visit Diagnosis: Dysphagia, oropharyngeal phase (R13.12) Past Medical History: Past Medical History: Diagnosis Date  Acute blood loss anemia   Acute bronchitis 05/15/2017  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 07/14/2018  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  Altered mental status 08/21/2018  Anginal chest pain at rest [facility])   Chronic non, controlled on Lorazepam   Anxiety   Arthritis   all over   BPH (benign prostatic hyperplasia)   CAD (coronary artery disease)   Cellulitis of arm, right 06/01/2018  Cellulitis of left axilla   Chronic lower back pain   Complication of anesthesia   problems making water afterwards  Depression   DJD (degenerative joint disease)   Esophageal ulcer without bleeding   bid ppi indefinitely  Fall 07/16/2018  Family history of adverse reaction to anesthesia   children w/PONV  GERD (gastroesophageal reflux disease)   Haemophilus influenzae septicemia (HCC) 03/12/2023  Headache   couple/week maybe (09/04/2015)  Hemochromatosis   Possible,  elevated iron  stores, hook-like osteophytes on hand films, normal LFTs  Hepatitis   yellow jaundice as a baby  History of ASCVD   MULTIVESSEL  Hyperlipidemia   Hypertension   Hypotension   Mild aortic stenosis 06/23/2021  Osteoarthritis   Peripheral vascular disease (HCC)   Pneumonia   several times; got a little now (09/04/2015)  Pulmonary embolism (HCC) 02/02/2018  Rash and nonspecific skin eruption 06/01/2018  Rheumatoid arthritis (HCC)   RSV (respiratory syncytial virus pneumonia) 05/15/2022  Subdural hematoma (HCC)   small after fall 09/2016-plavix  held, neurosurgery consulted.  Asymptomatic.    Thromboembolism (HCC) 02/2018  on Lovenox  lifelong since he failed PO Eliquis  Past Surgical History: Past Surgical History: Procedure Laterality Date  ARM SKIN LESION BIOPSY / EXCISION Left 09/02/2015   CATARACT EXTRACTION W/ INTRAOCULAR LENS  IMPLANT, BILATERAL Bilateral   CHOLECYSTECTOMY OPEN    CORNEAL TRANSPLANT Bilateral   one at [facility]; one at [facility]  CORONARY ANGIOPLASTY WITH STENT PLACEMENT    CORONARY ARTERY BYPASS GRAFT  1999  DESCENDING AORTIC ANEURYSM REPAIR W/ STENT    DE stent ostium into the right radial free graft at OM-, 02-2007  ESOPHAGOGASTRODUODENOSCOPY N/A 11/09/2014  Procedure: ESOPHAGOGASTRODUODENOSCOPY (EGD);  Surgeon: Delilah Fend, MD;  Location: [facility] ENDOSCOPY;  Service: Endoscopy;  Laterality: N/A;  INGUINAL HERNIA REPAIR    JOINT REPLACEMENT    LEFT HEART CATH AND CORS/GRAFTS ANGIOGRAPHY N/A 06/27/2017  Procedure: LEFT HEART CATH AND CORS/GRAFTS ANGIOGRAPHY;  Surgeon: Millicent Ally, MD;  Location: [facility] INVASIVE CV LAB;  Service: Cardiovascular;  Laterality: N/A;  NASAL SINUS SURGERY    SHOULDER OPEN ROTATOR CUFF REPAIR Right   TOTAL KNEE ARTHROPLASTY Bilateral  Delsa Fife MA, CCC-SLP s Delsa Fife Meryl 09/10/2023, 10:32 AM  DG Chest 1 View Result Date: 09/10/2023 CLINICAL DATA:  Shortness of breath. EXAM: CHEST  1 VIEW COMPARISON:  Chest radiograph dated 08/23/2023. FINDINGS: There is diffuse chronic intra coarsening. Right lung base atelectasis/scarring. Pneumonia is not excluded. No large pleural effusion. No pneumothorax. Stable cardiac silhouette. Median sternotomy wires. No acute osseous pathology. IMPRESSION: Right lung base atelectasis/scarring. Pneumonia is not excluded. Electronically Signed   By: Angus Bark M.D.   On: 09/10/2023 09:44   CT Chest Wo Contrast Result Date: 09/09/2023 CLINICAL DATA:  Pneumonia, complication suspected, xray done outpt xray - concern for PNA EXAM: CT CHEST WITHOUT CONTRAST TECHNIQUE: Multidetector CT imaging of the chest was performed following the standard protocol without IV contrast. RADIATION DOSE REDUCTION: This exam was performed according to the departmental dose-optimization program which includes automated exposure control, adjustment of the  mA and/or kV according to patient size and/or use of iterative reconstruction technique. COMPARISON:  08/23/2023, 03/13/2023 FINDINGS: Cardiovascular: Unenhanced imaging of the heart demonstrates cardiomegaly without pericardial effusion. Prominent left atrial dilatation. Postsurgical changes from prior CABG. Normal caliber of the thoracic aorta. Atherosclerosis of the aorta and coronary vasculature. Assessment of the vascular lumen cannot be performed without intravenous contrast. Mediastinum/Nodes: Borderline enlarged mediastinal adenopathy, measuring up to 10 mm in the right paratracheal station. Thyroid , trachea, and esophagus are unremarkable. Lungs/Pleura: Diffuse subpleural interlobular septal thickening consistent with chronic scarring. There is superimposed dense airspace disease within the dependent right lower lobe consistent with aspiration or infection. Trace right pleural effusion. No pneumothorax. Central airways are patent. Upper Abdomen: No acute abnormality. Musculoskeletal: No acute or destructive bony abnormalities. Reconstructed images demonstrate no additional findings. IMPRESSION: 1. Dense right basilar consolidation consistent with infection or aspiration. 2. Trace right pleural effusion. 3. Basilar predominant  subpleural scarring and fibrosis. 4.  Aortic Atherosclerosis (ICD10-I70.0). 5. Cardiomegaly, with diffuse coronary artery atherosclerosis. Electronically Signed   By: Bobbye Burrow M.D.   On: 09/09/2023 19:53   VAS US  UPPER EXTREMITY VENOUS DUPLEX Result Date: 08/25/2023 UPPER VENOUS STUDY  Patient Name:  KAINAN PATTY  Date of Exam:   08/24/2023 Medical Rec #: [id number]     Accession #:    [id number] Date of Birth: 1927/10/06     Patient Gender: M Patient Age:   11 years Exam Location:  [facility] Procedure:      VAS US  UPPER EXTREMITY VENOUS DUPLEX Referring Phys: Arnulfo Larch --------------------------------------------------------------------------------  Indications:  Edema Risk Factors: PICC line. Limitations: Poor ultrasound/tissue interface and patient positioning, patient movement. Comparison Study: No prior studies. Performing Technologist: Lerry Ransom RVT  Examination Guidelines: A complete evaluation includes B-mode imaging, spectral Doppler, color Doppler, and power Doppler as needed of all accessible portions of each vessel. Bilateral testing is considered an integral part of a complete examination. Limited examinations for reoccurring indications may be performed as noted.  Right Findings: +----------+------------+---------+-----------+----------+-------+ RIGHT     CompressiblePhasicitySpontaneousPropertiesSummary +----------+------------+---------+-----------+----------+-------+ IJV           Full       Yes       Yes                      +----------+------------+---------+-----------+----------+-------+ Subclavian               Yes       Yes                      +----------+------------+---------+-----------+----------+-------+ Axillary      Full       Yes       Yes                      +----------+------------+---------+-----------+----------+-------+ Brachial      Full                                          +----------+------------+---------+-----------+----------+-------+ Radial        Full                                          +----------+------------+---------+-----------+----------+-------+ Ulnar         Full                                          +----------+------------+---------+-----------+----------+-------+ Cephalic      Full                                          +----------+------------+---------+-----------+----------+-------+ Basilic       Full                                          +----------+------------+---------+-----------+----------+-------+  Left Findings: +----------+------------+---------+-----------+----------+-------+ LEFT       CompressiblePhasicitySpontaneousPropertiesSummary +----------+------------+---------+-----------+----------+-------+ Subclavian  Yes       Yes                      +----------+------------+---------+-----------+----------+-------+  Summary:  Right: No evidence of deep vein thrombosis in the upper extremity. No evidence of superficial vein thrombosis in the upper extremity.  Left: No evidence of thrombosis in the subclavian.  *See table(s) above for measurements and observations.  Diagnosing physician: Delaney Fearing Electronically signed by Delaney Fearing on 08/25/2023 at 8:17:59 AM.    Final    DG Wrist Complete Right Result Date: 08/23/2023 CLINICAL DATA:  Pain and stiffness, initial encounter EXAM: RIGHT WRIST - COMPLETE 3+ VIEW COMPARISON:  None Available. FINDINGS: Significant degenerative changes at the first [facility] joint are noted. No acute fracture or dislocation is noted. Degenerative changes in the radiocarpal joint are seen as well. No soft tissue abnormality is seen. IMPRESSION: Degenerative change without acute abnormality. Electronically Signed   By: Violeta Grey M.D.   On: 08/23/2023 19:14   DG Chest Portable 1 View Result Date: 08/23/2023 CLINICAL DATA:  Altered mental status EXAM: PORTABLE CHEST 1 VIEW COMPARISON:  08/13/2023 FINDINGS: Cardiac shadow is enlarged but stable. Postsurgical changes are seen. Aortic calcifications are noted. Chronic fibrotic changes are noted right greater than left stable from the prior exam. No acute infiltrate or effusion is noted. No bony abnormality is seen. IMPRESSION: Chronic changes without acute abnormality. Electronically Signed   By: Violeta Grey M.D.   On: 08/23/2023 19:13     Discharge Exam: Vitals:   09/15/23 0937 09/15/23 0952  BP: (!) 94/55 109/62  Pulse: 77 85  Resp:  17  Temp:  98 F (36.7 C)  SpO2:  95%   Vitals:   09/15/23 0544 09/15/23 0841 09/15/23 0937 09/15/23 0952  BP: 122/64  (!) 94/55 109/62  Pulse: 87  88 77 85  Resp: 18 18  17   Temp: 99.1 F (37.3 C)   98 F (36.7 C)  TempSrc: Oral   Oral  SpO2: 94%   95%  Weight:      Height:        General: Pt is alert, awake, not in acute distress Cardiovascular: RRR, S1/S2 +, no rubs, no gallops Respiratory: Coarse breath sounds bilaterally with rhonchi which is chronic for him. Abdominal: Soft, NT, ND, bowel sounds + Extremities: no edema, no cyanosis    The results of significant diagnostics from this hospitalization (including imaging, microbiology, ancillary and laboratory) are listed below for reference.     Microbiology: Recent Results (from the past 240 hours)  Urine Culture     Status: Abnormal   Collection Time: 09/09/23  8:10 PM   Specimen: Urine, Clean Catch  Result Value Ref Range Status   Specimen Description URINE, CLEAN CATCH  Final   Special Requests   Final    NONE Performed at [facility], [address] [place], [place] [address]    Culture (A)  Final    >=100,000 COLONIES/mL PSEUDOMONAS AERUGINOSA 20,000 COLONIES/mL KLEBSIELLA PNEUMONIAE    Report Status 09/13/2023 FINAL  Final   Organism ID, Bacteria PSEUDOMONAS AERUGINOSA (A)  Final   Organism ID, Bacteria KLEBSIELLA PNEUMONIAE (A)  Final      Susceptibility   Klebsiella pneumoniae - MIC*    AMPICILLIN  >=32 RESISTANT Resistant     CEFAZOLIN  <=4 SENSITIVE Sensitive     CEFEPIME  <=0.12 SENSITIVE Sensitive     CEFTRIAXONE  <=0.25 SENSITIVE Sensitive     CIPROFLOXACIN  <=0.25  SENSITIVE Sensitive     GENTAMICIN  <=1 SENSITIVE Sensitive     IMIPENEM <=0.25 SENSITIVE Sensitive     NITROFURANTOIN 128 RESISTANT Resistant     TRIMETH /SULFA  <=20 SENSITIVE Sensitive     AMPICILLIN /SULBACTAM >=32 RESISTANT Resistant     PIP/TAZO 16 SENSITIVE Sensitive ug/mL    * 20,000 COLONIES/mL KLEBSIELLA PNEUMONIAE   Pseudomonas aeruginosa - MIC*    CEFTAZIDIME 2 SENSITIVE Sensitive     CIPROFLOXACIN  <=0.25 SENSITIVE Sensitive     GENTAMICIN  <=1 SENSITIVE Sensitive      IMIPENEM 0.5 SENSITIVE Sensitive     PIP/TAZO 8 SENSITIVE Sensitive ug/mL    CEFEPIME  2 SENSITIVE Sensitive     * >=100,000 COLONIES/mL PSEUDOMONAS AERUGINOSA  SARS Coronavirus 2 by RT PCR (hospital order, performed in [facility] hospital lab) *cepheid single result test* Anterior Nasal Swab     Status: None   Collection Time: 09/10/23  4:53 AM   Specimen: Anterior Nasal Swab  Result Value Ref Range Status   SARS Coronavirus 2 by RT PCR NEGATIVE NEGATIVE Final    Comment: Performed at [facility], [address], [place], [place] [address]  Respiratory (~20 pathogens) panel by PCR     Status: None   Collection Time: 09/10/23  4:53 AM   Specimen: Anterior Nasal Swab; Respiratory  Result Value Ref Range Status   Adenovirus NOT DETECTED NOT DETECTED Final   Coronavirus 229E NOT DETECTED NOT DETECTED Final    Comment: (NOTE) The Coronavirus on the Respiratory Panel, DOES NOT test for the novel  Coronavirus (2019 nCoV)    Coronavirus HKU1 NOT DETECTED NOT DETECTED Final   Coronavirus NL63 NOT DETECTED NOT DETECTED Final   Coronavirus OC43 NOT DETECTED NOT DETECTED Final   Metapneumovirus NOT DETECTED NOT DETECTED Final   Rhinovirus / Enterovirus NOT DETECTED NOT DETECTED Final   Influenza A NOT DETECTED NOT DETECTED Final   Influenza B NOT DETECTED NOT DETECTED Final   Parainfluenza Virus 1 NOT DETECTED NOT DETECTED Final   Parainfluenza Virus 2 NOT DETECTED NOT DETECTED Final   Parainfluenza Virus 3 NOT DETECTED NOT DETECTED Final   Parainfluenza Virus 4 NOT DETECTED NOT DETECTED Final   Respiratory Syncytial Virus NOT DETECTED NOT DETECTED Final   Bordetella pertussis NOT DETECTED NOT DETECTED Final   Bordetella Parapertussis NOT DETECTED NOT DETECTED Final   Chlamydophila pneumoniae NOT DETECTED NOT DETECTED Final   Mycoplasma pneumoniae NOT DETECTED NOT DETECTED Final    Comment: Performed at [facility], [address] [place], [place] [address]     Labs: BNP  (last 3 results) Recent Labs    08/13/23 1020 09/10/23 0224 09/13/23 1200  BNP 587.6* 927.6* 1,666.8*   Basic Metabolic Panel: Recent Labs  Lab 09/09/23 2100 09/10/23 0224 09/11/23 0610 09/12/23 0609 09/13/23 0631 09/13/23 1023 09/14/23 0628 09/15/23 0530  NA  --    < > 135 134* 134*  --  134* 135  K  --    < > 3.9 3.9 3.7  --  3.2* 3.9  CL  --    < > 109 106 108  --  106 107  CO2  --    < > 19* 20* 18*  --  20* 20*  GLUCOSE  --    < > 114* 99 102*  --  112* 114*  BUN  --    < > 13 11 14   --  18 20  CREATININE  --    < > 0.94  0.90 1.08  --  1.27* 1.23  CALCIUM   --    < > 7.6* 7.7* 7.9*  --  8.0* 8.2*  MG 1.7  --   --  1.6*  --  1.7 1.6*  --    < > = values in this interval not displayed.   Liver Function Tests: Recent Labs  Lab 09/10/23 0224  AST 16  ALT 17  ALKPHOS 55  BILITOT 1.0  PROT 5.9*  ALBUMIN 2.6*   No results for input(s): LIPASE, AMYLASE in the last 168 hours. No results for input(s): AMMONIA in the last 168 hours. CBC: Recent Labs  Lab 09/09/23 1808 09/10/23 0224 09/11/23 0610 09/13/23 1023 09/14/23 0628  WBC 15.8* 11.6* 7.9 9.6 6.2  NEUTROABS  --   --   --  8.2* 4.1  HGB 10.1* 9.4* 8.3* 9.1* 9.1*  HCT 29.6* 28.5* 24.8* 27.5* 27.2*  MCV 100.3* 103.6* 102.9* 101.9* 100.7*  PLT 82* 61* 58* 61* 59*   Cardiac Enzymes: No results for input(s): CKTOTAL, CKMB, CKMBINDEX, TROPONINI in the last 168 hours. BNP: Invalid input(s): POCBNP CBG: No results for input(s): GLUCAP in the last 168 hours. D-Dimer No results for input(s): DDIMER in the last 72 hours. Hgb A1c No results for input(s): HGBA1C in the last 72 hours. Lipid Profile No results for input(s): CHOL, HDL, LDLCALC, TRIG, CHOLHDL, LDLDIRECT in the last 72 hours. Thyroid  function studies No results for input(s): TSH, T4TOTAL, T3FREE, THYROIDAB in the last 72 hours.  Invalid input(s): FREET3 Anemia work up No results for input(s):  VITAMINB12, FOLATE, FERRITIN, TIBC, IRON , RETICCTPCT in the last 72 hours. Urinalysis    Component Value Date/Time   COLORURINE AMBER (A) 09/09/2023 2010   APPEARANCEUR HAZY (A) 09/09/2023 2010   LABSPEC 1.017 09/09/2023 2010   PHURINE 5.0 09/09/2023 2010   GLUCOSEU NEGATIVE 09/09/2023 2010   HGBUR NEGATIVE 09/09/2023 2010   BILIRUBINUR NEGATIVE 09/09/2023 2010   KETONESUR NEGATIVE 09/09/2023 2010   PROTEINUR NEGATIVE 09/09/2023 2010   UROBILINOGEN 0.2 07/13/2014 1804   NITRITE NEGATIVE 09/09/2023 2010   LEUKOCYTESUR MODERATE (A) 09/09/2023 2010   Sepsis Labs Recent Labs  Lab 09/10/23 0224 09/11/23 0610 09/13/23 1023 09/14/23 0628  WBC 11.6* 7.9 9.6 6.2   Microbiology Recent Results (from the past 240 hours)  Urine Culture     Status: Abnormal   Collection Time: 09/09/23  8:10 PM   Specimen: Urine, Clean Catch  Result Value Ref Range Status   Specimen Description URINE, CLEAN CATCH  Final   Special Requests   Final    NONE Performed at [facility], [address], [place], [place] [address]    Culture (A)  Final    >=100,000 COLONIES/mL PSEUDOMONAS AERUGINOSA 20,000 COLONIES/mL KLEBSIELLA PNEUMONIAE    Report Status 09/13/2023 FINAL  Final   Organism ID, Bacteria PSEUDOMONAS AERUGINOSA (A)  Final   Organism ID, Bacteria KLEBSIELLA PNEUMONIAE (A)  Final      Susceptibility   Klebsiella pneumoniae - MIC*    AMPICILLIN  >=32 RESISTANT Resistant     CEFAZOLIN  <=4 SENSITIVE Sensitive     CEFEPIME  <=0.12 SENSITIVE Sensitive     CEFTRIAXONE  <=0.25 SENSITIVE Sensitive     CIPROFLOXACIN  <=0.25 SENSITIVE Sensitive     GENTAMICIN  <=1 SENSITIVE Sensitive     IMIPENEM <=0.25 SENSITIVE Sensitive     NITROFURANTOIN 128 RESISTANT Resistant     TRIMETH /SULFA  <=20 SENSITIVE Sensitive     AMPICILLIN /SULBACTAM >=32 RESISTANT Resistant     PIP/TAZO 16 SENSITIVE  Sensitive ug/mL    * 20,000 COLONIES/mL KLEBSIELLA PNEUMONIAE   Pseudomonas aeruginosa - MIC*     CEFTAZIDIME 2 SENSITIVE Sensitive     CIPROFLOXACIN  <=0.25 SENSITIVE Sensitive     GENTAMICIN  <=1 SENSITIVE Sensitive     IMIPENEM 0.5 SENSITIVE Sensitive     PIP/TAZO 8 SENSITIVE Sensitive ug/mL    CEFEPIME  2 SENSITIVE Sensitive     * >=100,000 COLONIES/mL PSEUDOMONAS AERUGINOSA  SARS Coronavirus 2 by RT PCR (hospital order, performed in [facility] lab) *cepheid single result test* Anterior Nasal Swab     Status: None   Collection Time: 09/10/23  4:53 AM   Specimen: Anterior Nasal Swab  Result Value Ref Range Status   SARS Coronavirus 2 by RT PCR NEGATIVE NEGATIVE Final    Comment: Performed at [facility], [address] [place], [place] [address]  Respiratory (~20 pathogens) panel by PCR     Status: None   Collection Time: 09/10/23  4:53 AM   Specimen: Anterior Nasal Swab; Respiratory  Result Value Ref Range Status   Adenovirus NOT DETECTED NOT DETECTED Final   Coronavirus 229E NOT DETECTED NOT DETECTED Final    Comment: (NOTE) The Coronavirus on the Respiratory Panel, DOES NOT test for the novel  Coronavirus (2019 nCoV)    Coronavirus HKU1 NOT DETECTED NOT DETECTED Final   Coronavirus NL63 NOT DETECTED NOT DETECTED Final   Coronavirus OC43 NOT DETECTED NOT DETECTED Final   Metapneumovirus NOT DETECTED NOT DETECTED Final   Rhinovirus / Enterovirus NOT DETECTED NOT DETECTED Final   Influenza A NOT DETECTED NOT DETECTED Final   Influenza B NOT DETECTED NOT DETECTED Final   Parainfluenza Virus 1 NOT DETECTED NOT DETECTED Final   Parainfluenza Virus 2 NOT DETECTED NOT DETECTED Final   Parainfluenza Virus 3 NOT DETECTED NOT DETECTED Final   Parainfluenza Virus 4 NOT DETECTED NOT DETECTED Final   Respiratory Syncytial Virus NOT DETECTED NOT DETECTED Final   Bordetella pertussis NOT DETECTED NOT DETECTED Final   Bordetella Parapertussis NOT DETECTED NOT DETECTED Final   Chlamydophila pneumoniae NOT DETECTED NOT DETECTED Final   Mycoplasma pneumoniae NOT DETECTED  NOT DETECTED Final    Comment: Performed at [facility], [address] [place], [place] [address]    FURTHER DISCHARGE INSTRUCTIONS:   Get Medicines reviewed and adjusted: Please take all your medications with you for your next visit with your Primary MD   Laboratory/radiological data: Please request your Primary MD to go over all hospital tests and procedure/radiological results at the follow up, please ask your Primary MD to get all Hospital records sent to his/her office.   In some cases, they will be blood work, cultures and biopsy results pending at the time of your discharge. Please request that your primary care M.D. goes through all the records of your hospital data and follows up on these results.   Also Note the following: If you experience worsening of your admission symptoms, develop shortness of breath, life threatening emergency, suicidal or homicidal thoughts you must seek medical attention immediately by calling 911 or calling your MD immediately  if symptoms less severe.   You must read complete instructions/literature along with all the possible adverse reactions/side effects for all the Medicines you take and that have been prescribed to you. Take any new Medicines after you have completely understood and accpet all the possible adverse reactions/side effects.    patient was instructed, not to drive, operate heavy machinery, perform activities at  heights, swimming or participation in water activities or provide baby-sitting services while on Pain, Sleep and Anxiety Medications; until their outpatient Physician has advised to do so again. Also recommended to not to take more than prescribed Pain, Sleep and Anxiety Medications.  It is not advisable to combine anxiety, sleep and pain medications without talking with your primary care provider.     Wear Seat belts while driving.   Please note: You were cared for by a hospitalist during your hospital stay. Once you are  discharged, your primary care physician will handle any further medical issues. Please note that NO REFILLS for any discharge medications will be authorized once you are discharged, as it is imperative that you return to your primary care physician (or establish a relationship with a primary care physician if you do not have one) for your post hospital discharge needs so that they can reassess your need for medications and monitor your lab values  Time coordinating discharge: Over 30 minutes  SIGNED:   Modena Andes, MD  [facility] 09/15/2023, 11:22 AM *Please note that this is a verbal dictation therefore any spelling or grammatical errors are due to the Dragon Medical One system interpretation. If 7PM-7AM, please contact night-coverage [email or web address]

## 2023-09-15 NOTE — Plan of Care (Signed)
  Problem: Education: Goal: Knowledge of General Education information will improve Description: Including pain rating scale, medication(s)/side effects and non-pharmacologic comfort measures Outcome: Adequate for Discharge   Problem: Health Behavior/Discharge Planning: Goal: Ability to manage health-related needs will improve Outcome: Adequate for Discharge   Problem: Clinical Measurements: Goal: Ability to maintain clinical measurements within normal limits will improve Outcome: Adequate for Discharge Goal: Will remain free from infection Outcome: Adequate for Discharge Goal: Diagnostic test results will improve Outcome: Adequate for Discharge Goal: Respiratory complications will improve Outcome: Adequate for Discharge Goal: Cardiovascular complication will be avoided Outcome: Adequate for Discharge   Problem: Activity: Goal: Risk for activity intolerance will decrease Outcome: Adequate for Discharge   Problem: Nutrition: Goal: Adequate nutrition will be maintained Outcome: Adequate for Discharge   Problem: Coping: Goal: Level of anxiety will decrease Outcome: Adequate for Discharge   Problem: Elimination: Goal: Will not experience complications related to bowel motility Outcome: Adequate for Discharge Goal: Will not experience complications related to urinary retention Outcome: Adequate for Discharge   Problem: Pain Managment: Goal: General experience of comfort will improve and/or be controlled Outcome: Adequate for Discharge   Problem: Safety: Goal: Ability to remain free from injury will improve Outcome: Adequate for Discharge   Problem: Skin Integrity: Goal: Risk for impaired skin integrity will decrease Outcome: Adequate for Discharge   Problem: Acute Rehab OT Goals (only OT should resolve) Goal: Pt. Will Perform Grooming Outcome: Adequate for Discharge Goal: Pt. Will Perform Upper Body Bathing Outcome: Adequate for Discharge Goal: Pt. Will Transfer To  Toilet Outcome: Adequate for Discharge Goal: OT Additional ADL Goal #1 Outcome: Adequate for Discharge   Problem: Acute Rehab PT Goals(only PT should resolve) Goal: Pt Will Go Supine/Side To Sit Outcome: Adequate for Discharge Goal: Patient Will Transfer Sit To/From Stand Outcome: Adequate for Discharge Goal: Pt Will Transfer Bed To Chair/Chair To Bed Outcome: Adequate for Discharge Goal: Pt Will Ambulate Outcome: Adequate for Discharge Goal: Pt/caregiver will Perform Home Exercise Program Outcome: Adequate for Discharge

## 2023-09-15 NOTE — Progress Notes (Signed)
 Explained discharge instructions to patient's daughter and son. Reviewed follow up appointment and next medication administration times. Also reviewed education. According to patient's RN he came in with a foley and will discharge with it. Nurse is checking with his doctor to confirm he is to leave with his foley. Patient's family verbalized having an understanding for instructions given. IV was removed by the NT. No other needs verbalized. Floor staff is assisting with getting the patient dressed and family is at the bedside to transport home.

## 2023-09-18 ENCOUNTER — Other Ambulatory Visit (HOSPITAL_COMMUNITY): Payer: Self-pay

## 2023-09-18 ENCOUNTER — Telehealth: Payer: Self-pay

## 2023-09-18 NOTE — Transitions of Care (Post Inpatient/ED Visit) (Signed)
   09/18/2023  Name: Evan [name] MRN: [id number] DOB: [birth year]  Today's TOC FU Call Status: Today's TOC FU Call Status:: Successful TOC FU Call Completed TOC FU Call Complete Date: 09/18/23 (Spoke with patient's daugther who declined TOC progam stating she doesn't have time for the calls and patient has [facility] coming) Patient's Name and Date of Birth confirmed.  Transition Care Management Follow-up Telephone Call Date of Discharge: 09/15/23 Discharge Facility: [facility]) Type of Discharge: Inpatient Admission Primary Inpatient Discharge Diagnosis:: Aspiration pneumonia   Tonia Frankel RN, CCM [place]  [facility]BC[facility]-Population Health RN Care Manager [phone]

## 2023-09-19 DIAGNOSIS — D631 Anemia in chronic kidney disease: Secondary | ICD-10-CM | POA: Diagnosis not present

## 2023-09-19 DIAGNOSIS — I5033 Acute on chronic diastolic (congestive) heart failure: Secondary | ICD-10-CM | POA: Diagnosis not present

## 2023-09-19 DIAGNOSIS — I13 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 failure and stage 1 through stage 4 chronic kidney disease, or unspecified chronic kidney disease: Secondary | ICD-10-CM | POA: Diagnosis not present

## 2023-09-19 DIAGNOSIS — C3490 Malignant neoplasm of unspecified part of unspecified bronchus or lung: Secondary | ICD-10-CM | POA: Diagnosis not present

## 2023-09-19 DIAGNOSIS — J123 Human metapneumovirus pneumonia: Secondary | ICD-10-CM | POA: Diagnosis not present

## 2023-09-19 DIAGNOSIS — J690 Pneumonitis due to inhalation of food and vomit: Secondary | ICD-10-CM | POA: Diagnosis not present

## 2023-09-19 DIAGNOSIS — N1832 Chronic kidney disease, stage 3b: Secondary | ICD-10-CM | POA: Diagnosis not present

## 2023-09-19 DIAGNOSIS — D630 Anemia in neoplastic disease: Secondary | ICD-10-CM | POA: Diagnosis not present

## 2023-09-19 DIAGNOSIS — J440 Chronic obstructive pulmonary disease with acute lower respiratory infection: Secondary | ICD-10-CM | POA: Diagnosis not present

## 2023-09-22 ENCOUNTER — Observation Stay (HOSPITAL_COMMUNITY)

## 2023-09-22 ENCOUNTER — Emergency Department (HOSPITAL_COMMUNITY)

## 2023-09-22 ENCOUNTER — Encounter (HOSPITAL_COMMUNITY): Payer: Self-pay | Admitting: Emergency Medicine

## 2023-09-22 ENCOUNTER — Inpatient Hospital Stay (HOSPITAL_COMMUNITY)
Admission: EM | Admit: 2023-09-22 | Discharge: 2023-09-26 | DRG: 064 | Disposition: A | Discharge status: Home Health | Attending: Internal Medicine | Admitting: Internal Medicine

## 2023-09-22 DIAGNOSIS — I639 Cerebral infarction, unspecified: Secondary | ICD-10-CM | POA: Diagnosis present

## 2023-09-22 DIAGNOSIS — R29818 Other symptoms and signs involving the nervous system: Secondary | ICD-10-CM | POA: Diagnosis not present

## 2023-09-22 DIAGNOSIS — R569 Unspecified convulsions: Secondary | ICD-10-CM | POA: Diagnosis not present

## 2023-09-22 DIAGNOSIS — I672 Cerebral atherosclerosis: Secondary | ICD-10-CM | POA: Diagnosis not present

## 2023-09-22 DIAGNOSIS — J9601 Acute respiratory failure with hypoxia: Secondary | ICD-10-CM | POA: Diagnosis not present

## 2023-09-22 DIAGNOSIS — Z66 Do not resuscitate: Secondary | ICD-10-CM | POA: Diagnosis present

## 2023-09-22 DIAGNOSIS — Z7901 Long term (current) use of anticoagulants: Secondary | ICD-10-CM

## 2023-09-22 DIAGNOSIS — J449 Chronic obstructive pulmonary disease, unspecified: Secondary | ICD-10-CM | POA: Diagnosis present

## 2023-09-22 DIAGNOSIS — I6340 Cerebral infarction due to embolism of unspecified cerebral artery: Secondary | ICD-10-CM | POA: Diagnosis not present

## 2023-09-22 DIAGNOSIS — D696 Thrombocytopenia, unspecified: Secondary | ICD-10-CM | POA: Diagnosis present

## 2023-09-22 DIAGNOSIS — I5033 Acute on chronic diastolic (congestive) heart failure: Secondary | ICD-10-CM | POA: Diagnosis not present

## 2023-09-22 DIAGNOSIS — I63511 Cerebral infarction due to unspecified occlusion or stenosis of right middle cerebral artery: Principal | ICD-10-CM | POA: Diagnosis present

## 2023-09-22 DIAGNOSIS — I63413 Cerebral infarction due to embolism of bilateral middle cerebral arteries: Secondary | ICD-10-CM | POA: Diagnosis not present

## 2023-09-22 DIAGNOSIS — F32A Depression, unspecified: Secondary | ICD-10-CM | POA: Diagnosis present

## 2023-09-22 DIAGNOSIS — I739 Peripheral vascular disease, unspecified: Secondary | ICD-10-CM | POA: Diagnosis present

## 2023-09-22 DIAGNOSIS — I4821 Permanent atrial fibrillation: Secondary | ICD-10-CM | POA: Diagnosis present

## 2023-09-22 DIAGNOSIS — I69351 Hemiplegia and hemiparesis following cerebral infarction affecting right dominant side: Secondary | ICD-10-CM | POA: Diagnosis not present

## 2023-09-22 DIAGNOSIS — R54 Age-related physical debility: Secondary | ICD-10-CM | POA: Diagnosis present

## 2023-09-22 DIAGNOSIS — R29719 NIHSS score 19: Secondary | ICD-10-CM

## 2023-09-22 DIAGNOSIS — R062 Wheezing: Secondary | ICD-10-CM | POA: Diagnosis not present

## 2023-09-22 DIAGNOSIS — N400 Enlarged prostate without lower urinary tract symptoms: Secondary | ICD-10-CM | POA: Diagnosis present

## 2023-09-22 DIAGNOSIS — Z86711 Personal history of pulmonary embolism: Secondary | ICD-10-CM | POA: Diagnosis not present

## 2023-09-22 DIAGNOSIS — F0394 Unspecified dementia, unspecified severity, with anxiety: Secondary | ICD-10-CM | POA: Diagnosis not present

## 2023-09-22 DIAGNOSIS — J690 Pneumonitis due to inhalation of food and vomit: Secondary | ICD-10-CM | POA: Diagnosis not present

## 2023-09-22 DIAGNOSIS — I6782 Cerebral ischemia: Secondary | ICD-10-CM | POA: Diagnosis not present

## 2023-09-22 DIAGNOSIS — N1832 Chronic kidney disease, stage 3b: Secondary | ICD-10-CM | POA: Diagnosis present

## 2023-09-22 DIAGNOSIS — K219 Gastro-esophageal reflux disease without esophagitis: Secondary | ICD-10-CM | POA: Diagnosis present

## 2023-09-22 DIAGNOSIS — E876 Hypokalemia: Secondary | ICD-10-CM | POA: Diagnosis present

## 2023-09-22 DIAGNOSIS — I6389 Other cerebral infarction: Secondary | ICD-10-CM | POA: Diagnosis not present

## 2023-09-22 DIAGNOSIS — J9611 Chronic respiratory failure with hypoxia: Secondary | ICD-10-CM | POA: Diagnosis present

## 2023-09-22 DIAGNOSIS — Z951 Presence of aortocoronary bypass graft: Secondary | ICD-10-CM

## 2023-09-22 DIAGNOSIS — R4182 Altered mental status, unspecified: Secondary | ICD-10-CM | POA: Diagnosis not present

## 2023-09-22 DIAGNOSIS — E785 Hyperlipidemia, unspecified: Secondary | ICD-10-CM | POA: Diagnosis present

## 2023-09-22 DIAGNOSIS — Z85118 Personal history of other malignant neoplasm of bronchus and lung: Secondary | ICD-10-CM

## 2023-09-22 DIAGNOSIS — Z96653 Presence of artificial knee joint, bilateral: Secondary | ICD-10-CM | POA: Diagnosis present

## 2023-09-22 DIAGNOSIS — F0390 Unspecified dementia without behavioral disturbance: Secondary | ICD-10-CM | POA: Diagnosis present

## 2023-09-22 DIAGNOSIS — M069 Rheumatoid arthritis, unspecified: Secondary | ICD-10-CM | POA: Diagnosis present

## 2023-09-22 DIAGNOSIS — R1310 Dysphagia, unspecified: Secondary | ICD-10-CM | POA: Diagnosis present

## 2023-09-22 DIAGNOSIS — I69354 Hemiplegia and hemiparesis following cerebral infarction affecting left non-dominant side: Secondary | ICD-10-CM

## 2023-09-22 DIAGNOSIS — Z947 Corneal transplant status: Secondary | ICD-10-CM | POA: Diagnosis not present

## 2023-09-22 DIAGNOSIS — R4701 Aphasia: Principal | ICD-10-CM | POA: Diagnosis present

## 2023-09-22 DIAGNOSIS — I5030 Unspecified diastolic (congestive) heart failure: Secondary | ICD-10-CM | POA: Diagnosis not present

## 2023-09-22 DIAGNOSIS — I2510 Atherosclerotic heart disease of native coronary artery without angina pectoris: Secondary | ICD-10-CM | POA: Diagnosis present

## 2023-09-22 DIAGNOSIS — I5020 Unspecified systolic (congestive) heart failure: Secondary | ICD-10-CM | POA: Diagnosis present

## 2023-09-22 DIAGNOSIS — J9612 Chronic respiratory failure with hypercapnia: Secondary | ICD-10-CM | POA: Diagnosis present

## 2023-09-22 DIAGNOSIS — Z87891 Personal history of nicotine dependence: Secondary | ICD-10-CM

## 2023-09-22 DIAGNOSIS — H9193 Unspecified hearing loss, bilateral: Secondary | ICD-10-CM | POA: Diagnosis present

## 2023-09-22 DIAGNOSIS — I6523 Occlusion and stenosis of bilateral carotid arteries: Secondary | ICD-10-CM | POA: Diagnosis present

## 2023-09-22 DIAGNOSIS — J9610 Chronic respiratory failure, unspecified whether with hypoxia or hypercapnia: Secondary | ICD-10-CM | POA: Diagnosis present

## 2023-09-22 DIAGNOSIS — I63422 Cerebral infarction due to embolism of left anterior cerebral artery: Secondary | ICD-10-CM | POA: Diagnosis not present

## 2023-09-22 DIAGNOSIS — Z7951 Long term (current) use of inhaled steroids: Secondary | ICD-10-CM

## 2023-09-22 DIAGNOSIS — I13 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 failure and stage 1 through stage 4 chronic kidney disease, or unspecified chronic kidney disease: Secondary | ICD-10-CM | POA: Diagnosis present

## 2023-09-22 DIAGNOSIS — I5032 Chronic diastolic (congestive) heart failure: Secondary | ICD-10-CM | POA: Diagnosis present

## 2023-09-22 DIAGNOSIS — Z888 Allergy status to other drugs, medicaments and biological substances status: Secondary | ICD-10-CM

## 2023-09-22 DIAGNOSIS — F0393 Unspecified dementia, unspecified severity, with mood disturbance: Secondary | ICD-10-CM | POA: Diagnosis present

## 2023-09-22 DIAGNOSIS — I10 Essential (primary) hypertension: Secondary | ICD-10-CM | POA: Diagnosis not present

## 2023-09-22 DIAGNOSIS — Z91040 Latex allergy status: Secondary | ICD-10-CM

## 2023-09-22 DIAGNOSIS — I480 Paroxysmal atrial fibrillation: Secondary | ICD-10-CM | POA: Diagnosis not present

## 2023-09-22 DIAGNOSIS — Z955 Presence of coronary angioplasty implant and graft: Secondary | ICD-10-CM

## 2023-09-22 DIAGNOSIS — I110 Hypertensive heart disease with heart failure: Secondary | ICD-10-CM | POA: Diagnosis not present

## 2023-09-22 DIAGNOSIS — Z881 Allergy status to other antibiotic agents status: Secondary | ICD-10-CM

## 2023-09-22 DIAGNOSIS — Z79899 Other long term (current) drug therapy: Secondary | ICD-10-CM

## 2023-09-22 DIAGNOSIS — R918 Other nonspecific abnormal finding of lung field: Secondary | ICD-10-CM | POA: Diagnosis not present

## 2023-09-22 DIAGNOSIS — I700 Atherosclerosis of aorta: Secondary | ICD-10-CM | POA: Diagnosis not present

## 2023-09-22 DIAGNOSIS — Z961 Presence of intraocular lens: Secondary | ICD-10-CM | POA: Diagnosis present

## 2023-09-22 DIAGNOSIS — R590 Localized enlarged lymph nodes: Secondary | ICD-10-CM | POA: Diagnosis not present

## 2023-09-22 DIAGNOSIS — Z8249 Family history of ischemic heart disease and other diseases of the circulatory system: Secondary | ICD-10-CM

## 2023-09-22 DIAGNOSIS — D631 Anemia in chronic kidney disease: Secondary | ICD-10-CM | POA: Diagnosis not present

## 2023-09-22 HISTORY — DX: Cerebral infarction, unspecified: I63.9

## 2023-09-22 LAB — COMPREHENSIVE METABOLIC PANEL WITH GFR
ALT: 13 U/L (ref 0–44)
AST: 22 U/L (ref 15–41)
Albumin: 2.7 g/dL — ABNORMAL LOW (ref 3.5–5.0)
Alkaline Phosphatase: 86 U/L (ref 38–126)
Anion gap: 10 (ref 5–15)
BUN: 21 mg/dL (ref 8–23)
CO2: 20 mmol/L — ABNORMAL LOW (ref 22–32)
Calcium: 8.6 mg/dL — ABNORMAL LOW (ref 8.9–10.3)
Chloride: 106 mmol/L (ref 98–111)
Creatinine, Ser: 1.02 mg/dL (ref 0.61–1.24)
GFR, Estimated: >60 mL/min (ref >60)
Glucose, Bld: 118 mg/dL — ABNORMAL HIGH (ref 70–99)
Potassium: 3.1 mmol/L — ABNORMAL LOW (ref 3.5–5.1)
Sodium: 136 mmol/L (ref 135–145)
Total Bilirubin: 0.4 mg/dL (ref 0.0–1.2)
Total Protein: 6.8 g/dL (ref 6.5–8.1)

## 2023-09-22 LAB — I-STAT CHEM 8, ED
BUN: 20 mg/dL (ref 8–23)
Calcium, Ion: 1.07 mmol/L — ABNORMAL LOW (ref 1.15–1.40)
Chloride: 103 mmol/L (ref 98–111)
Creatinine, Ser: 0.9 mg/dL (ref 0.61–1.24)
Glucose, Bld: 124 mg/dL — ABNORMAL HIGH (ref 70–99)
HCT: 27 % — ABNORMAL LOW (ref 39.0–52.0)
Hemoglobin: 9.2 g/dL — ABNORMAL LOW (ref 13.0–17.0)
Potassium: 3 mmol/L — ABNORMAL LOW (ref 3.5–5.1)
Sodium: 137 mmol/L (ref 135–145)
TCO2: 18 mmol/L — ABNORMAL LOW (ref 22–32)

## 2023-09-22 LAB — CBC
HCT: 27.3 % — ABNORMAL LOW (ref 39.0–52.0)
Hemoglobin: 8.8 g/dL — ABNORMAL LOW (ref 13.0–17.0)
MCH: 34 pg (ref 26.0–34.0)
MCHC: 32.2 g/dL (ref 30.0–36.0)
MCV: 105.4 fL — ABNORMAL HIGH (ref 80.0–100.0)
Platelets: 100 10*3/uL — ABNORMAL LOW (ref 150–400)
RBC: 2.59 MIL/uL — ABNORMAL LOW (ref 4.22–5.81)
RDW: 14.9 % (ref 11.5–15.5)
WBC: 6.1 10*3/uL (ref 4.0–10.5)
nRBC: 0 % (ref 0.0–0.2)

## 2023-09-22 LAB — DIFFERENTIAL
Abs Immature Granulocytes: 0.09 10*3/uL — ABNORMAL HIGH (ref 0.00–0.07)
Basophils Absolute: 0 10*3/uL (ref 0.0–0.1)
Basophils Relative: 0 %
Eosinophils Absolute: 0 10*3/uL (ref 0.0–0.5)
Eosinophils Relative: 0 %
Immature Granulocytes: 2 %
Lymphocytes Relative: 24 %
Lymphs Abs: 1.5 10*3/uL (ref 0.7–4.0)
Monocytes Absolute: 0.4 10*3/uL (ref 0.1–1.0)
Monocytes Relative: 6 %
Neutro Abs: 4.1 10*3/uL (ref 1.7–7.7)
Neutrophils Relative %: 68 %

## 2023-09-22 LAB — PROTIME-INR
INR: 1.4 — ABNORMAL HIGH (ref 0.8–1.2)
Prothrombin Time: 17.3 s — ABNORMAL HIGH (ref 11.4–15.2)

## 2023-09-22 LAB — RAPID URINE DRUG SCREEN, HOSP PERFORMED
Amphetamines: NOT DETECTED
Barbiturates: NOT DETECTED
Benzodiazepines: NOT DETECTED
Cocaine: NOT DETECTED
Opiates: NOT DETECTED
Tetrahydrocannabinol: NOT DETECTED

## 2023-09-22 LAB — URINALYSIS, ROUTINE W REFLEX MICROSCOPIC
Bilirubin Urine: NEGATIVE
Glucose, UA: NEGATIVE mg/dL
Hgb urine dipstick: NEGATIVE
Ketones, ur: NEGATIVE mg/dL
Leukocytes,Ua: NEGATIVE
Nitrite: NEGATIVE
Protein, ur: NEGATIVE mg/dL
Specific Gravity, Urine: 1.015 (ref 1.005–1.030)
pH: 7 (ref 5.0–8.0)

## 2023-09-22 LAB — FOLATE: Folate: 39.6 ng/mL (ref >5.9)

## 2023-09-22 LAB — CBG MONITORING, ED: Glucose-Capillary: 132 mg/dL — ABNORMAL HIGH (ref 70–99)

## 2023-09-22 LAB — VITAMIN B12: Vitamin B-12: 853 pg/mL (ref 180–914)

## 2023-09-22 LAB — HEMOGLOBIN A1C
Hgb A1c MFr Bld: 5.7 % — ABNORMAL HIGH (ref 4.8–5.6)
Mean Plasma Glucose: 116.89 mg/dL

## 2023-09-22 LAB — APTT: aPTT: 42 s — ABNORMAL HIGH (ref 24–36)

## 2023-09-22 LAB — TSH: TSH: 2.215 u[IU]/mL (ref 0.350–4.500)

## 2023-09-22 LAB — MAGNESIUM: Magnesium: 1.7 mg/dL (ref 1.7–2.4)

## 2023-09-22 LAB — ETHANOL: Alcohol, Ethyl (B): <15 mg/dL (ref <15)

## 2023-09-22 MED ORDER — STROKE: EARLY STAGES OF RECOVERY BOOK
Freq: Once | Status: AC
  Filled 2023-09-22: qty 1

## 2023-09-22 MED ORDER — POTASSIUM CHLORIDE 20 MEQ PO PACK: 40.0000 meq | PACK | Freq: Once | ORAL | Status: DC

## 2023-09-22 MED ORDER — IOHEXOL 350 MG/ML SOLN
75.0000 mL | Freq: Once | INTRAVENOUS | Status: AC | PRN
  Administered 2023-09-22: 75 mL via INTRAVENOUS

## 2023-09-22 NOTE — ED Provider Notes (Signed)
 [place] [facility] AT [facility] Provider Note   CSN: [id number] Arrival date & time: 09/22/23  1939     Patient presents with: Aphasia ; LKW 5 pm    Evan [name] is a 89 y.o. male.   HPI 89 year old male presents with expressive aphasia.  Patient's last known well was around 5 PM according to the son.  He was talking normally at dinner and then all of a sudden unable to clearly communicate.  History is very limited due to this.  He has a history of prior hemorrhagic stroke and is currently on Eliquis .  He has chronic right sided weakness from prior stroke.  Prior to Admission medications   Medication Sig Start Date End Date Taking? Authorizing Provider  acetaminophen  (TYLENOL ) 325 MG tablet Take 325-650 mg by mouth 2 (two) times daily as needed for mild pain (pain score 1-3), moderate pain (pain score 4-6), fever or headache.   Yes [provider]  apixaban  (ELIQUIS ) 2.5 MG TABS tablet Take 1 tablet (2.5 mg total) by mouth 2 (two) times daily. 10/14/22  Yes Gerald Kitty., NP  budesonide -formoterol  (SYMBICORT ) 160-4.5 MCG/ACT inhaler Inhale 2 puffs into the lungs in the morning and at bedtime. 06/08/23  Yes Austine Lefort, MD  ferrous sulfate  (FEROSUL) 325 (65 FE) MG tablet TAKE 1 TABLET EVERY DAY 08/15/23  Yes Austine Lefort, MD  FLUoxetine  (PROZAC ) 20 MG capsule TAKE 1 CAPSULE EVERY DAY 03/20/23  Yes Austine Lefort, MD  folic acid  (FOLVITE ) 1 MG tablet Take 1 tablet (1 mg total) by mouth daily. 08/21/23  Yes [name], Curlee Doss, MD  furosemide  (LASIX ) 40 MG tablet TAKE 1 TABLET EVERY DAY Patient taking differently: Take 40 mg by mouth every evening. 07/10/23  Yes Austine Lefort, MD  melatonin 3 MG TABS tablet Take 1 tablet (3 mg total) by mouth at bedtime as needed (insomnia). Patient taking differently: Take 3 mg by mouth at bedtime. 08/21/23  Yes [name], Curlee Doss, MD  metoprolol  tartrate (LOPRESSOR ) 25 MG tablet Take 25 mg by mouth  daily.   Yes [provider]  Multiple Vitamins-Minerals (MENS 50+ MULTIVITAMIN) TABS Take 1 tablet by mouth daily.   Yes [provider]  nystatin  (MYCOSTATIN /NYSTOP ) powder Apply a thin layer topically to the affected area 2-3 (two to three) times daily. 08/03/23  Yes   pantoprazole  (PROTONIX ) 40 MG tablet TAKE 1 TABLET EVERY MORNING 09/18/23  Yes Austine Lefort, MD  rosuvastatin  (CRESTOR ) 5 MG tablet Take 1 tablet (5 mg total) by mouth at bedtime. Patient taking differently: Take 5 mg by mouth every evening. 08/21/23  Yes [name], Curlee Doss, MD  senna-docusate (STIMULANT LAXATIVE) 8.6-50 MG tablet Take 2 tablets by mouth 2 (two) times daily as needed for constipation 07/21/22  Yes   tamsulosin  (FLOMAX ) 0.4 MG CAPS capsule Take 1 capsule (0.4 mg total) by mouth daily with supper. Patient taking differently: Take 0.4 mg by mouth daily after supper. 01/26/23  Yes Austine Lefort, MD  Zinc  Oxide (TRIPLE PASTE) 12.8 % ointment Apply topically 4 (four) times daily as needed (skin barrier). Patient taking differently: Apply 1 Application topically 2 (two) times daily as needed (skin barrier). 05/03/22  Yes Etter Hermann., MD  hydrOXYzine  (ATARAX ) 10 MG/5ML syrup Take 5 mLs (10 mg total) by mouth every 8 (eight) hours as needed for itching. Patient not taking: Reported on 09/22/2023 08/21/23   [name], Mauricio Daniel, MD  mupirocin  ointment (BACTROBAN ) 2 %  Apply 1 Application topically 2 (two) times daily. 07/24/23   Hyatt, [name], DPM  nitroGLYCERIN  (NITROSTAT ) 0.4 MG SL tablet Place 1 tablet (0.4 mg total) under the tongue every 5 (five) minutes as needed for chest pain. Patient not taking: Reported on 09/09/2023 05/16/23   Austine Lefort, MD    Allergies: Feldene [piroxicam], Neomycin -polymyxin-hc, Statins, Latex, Tequin [gatifloxacin], Valium [diazepam], and Vibramycin  [doxycycline ]    Review of Systems  Unable to perform ROS: Patient nonverbal    Updated Vital  Signs BP 126/78 (BP Location: Right Arm)   Pulse 81   Resp 16   SpO2 96%   Physical Exam Vitals and nursing note reviewed.  Constitutional:      Appearance: He is well-developed.  HENT:     Head: Normocephalic and atraumatic.   Cardiovascular:     Rate and Rhythm: Normal rate and regular rhythm.     Heart sounds: Normal heart sounds.  Pulmonary:     Effort: Pulmonary effort is normal.  Abdominal:     General: There is no distension.   Skin:    General: Skin is warm and dry.   Neurological:     Mental Status: He is alert.     Comments: Significant right-sided weakness.  Freely moves the left upper and left lower extremity.  Has difficulty speaking but is able to name objects such as his name and a watch.  Very slow to speak.    (all labs ordered are listed, but only abnormal results are displayed) Labs Reviewed  PROTIME-INR - Abnormal; Notable for the following components:      Result Value   Prothrombin Time 17.3 (*)    INR 1.4 (*)    All other components within normal limits  APTT - Abnormal; Notable for the following components:   aPTT 42 (*)    All other components within normal limits  CBC - Abnormal; Notable for the following components:   RBC 2.59 (*)    Hemoglobin 8.8 (*)    HCT 27.3 (*)    MCV 105.4 (*)    Platelets 100 (*)    All other components within normal limits  DIFFERENTIAL - Abnormal; Notable for the following components:   Abs Immature Granulocytes 0.09 (*)    All other components within normal limits  COMPREHENSIVE METABOLIC PANEL WITH GFR - Abnormal; Notable for the following components:   Potassium 3.1 (*)    CO2 20 (*)    Glucose, Bld 118 (*)    Calcium  8.6 (*)    Albumin 2.7 (*)    All other components within normal limits  URINALYSIS, ROUTINE W REFLEX MICROSCOPIC - Abnormal; Notable for the following components:   Color, Urine STRAW (*)    All other components within normal limits  HEMOGLOBIN A1C - Abnormal; Notable for the following  components:   Hgb A1c MFr Bld 5.7 (*)    All other components within normal limits  CBG MONITORING, ED - Abnormal; Notable for the following components:   Glucose-Capillary 132 (*)    All other components within normal limits  I-STAT CHEM 8, ED - Abnormal; Notable for the following components:   Potassium 3.0 (*)    Glucose, Bld 124 (*)    Calcium , Ion 1.07 (*)    TCO2 18 (*)    Hemoglobin 9.2 (*)    HCT 27.0 (*)    All other components within normal limits  ETHANOL  RAPID URINE DRUG SCREEN, HOSP PERFORMED  MAGNESIUM   VITAMIN B12  VITAMIN B1  FOLATE  METHYLMALONIC ACID, SERUM  TSH  LIPID PANEL    EKG: None  Radiology: CT ANGIO HEAD NECK W WO CM (CODE STROKE) Result Date: 09/22/2023 CLINICAL DATA:  Aphasia EXAM: CT ANGIOGRAPHY HEAD AND NECK WITH AND WITHOUT CONTRAST TECHNIQUE: Multidetector CT imaging of the head and neck was performed using the standard protocol during bolus administration of intravenous contrast. Multiplanar CT image reconstructions and MIPs were obtained to evaluate the vascular anatomy. Carotid stenosis measurements (when applicable) are obtained utilizing NASCET criteria, using the distal internal carotid diameter as the denominator. RADIATION DOSE REDUCTION: This exam was performed according to the departmental dose-optimization program which includes automated exposure control, adjustment of the mA and/or kV according to patient size and/or use of iterative reconstruction technique. CONTRAST:  75mL OMNIPAQUE  IOHEXOL  350 MG/ML SOLN COMPARISON:  07/14/2018 FINDINGS: CTA NECK FINDINGS Skeleton: No acute abnormality or high grade bony spinal canal stenosis. Other neck: Normal pharynx, larynx and major salivary glands. No cervical lymphadenopathy. Unremarkable thyroid  gland. Upper chest: Emphysema and unchanged right paratracheal adenopathy. Aortic arch: There is calcific atherosclerosis of the aortic arch. Conventional 3 vessel aortic branching pattern. RIGHT carotid  system: No dissection, occlusion or aneurysm. There is mixed density atherosclerosis extending into the proximal ICA, resulting in 50% stenosis. LEFT carotid system: Bulky, circumferential, mixed density atherosclerotic disease at the left carotid bifurcation extending into the proximal external and internal carotid arteries. There is 60% proximal left ICA stenosis. Vertebral arteries: Left dominant configuration. There is no dissection, occlusion or flow-limiting stenosis to the skull base (V1-V3 segments). CTA HEAD FINDINGS POSTERIOR CIRCULATION: Mild atherosclerosis of the left vertebral artery V4 segment. Right vertebral artery is diminutive but patent. No proximal occlusion of the anterior or inferior cerebellar arteries. Basilar artery is normal. Superior cerebellar arteries are normal. Posterior cerebral arteries are normal. ANTERIOR CIRCULATION: Atherosclerotic calcification of the internal carotid arteries at the skull base without hemodynamically significant stenosis. Azygous anterior cerebral artery. Middle cerebral arteries are normal. Venous sinuses: As permitted by contrast timing, patent. Anatomic variants: Fetal origins of both posterior cerebral arteries. Review of the MIP images confirms the above findings. IMPRESSION: 1. No emergent large vessel occlusion. 2. Bilateral carotid bifurcation atherosclerosis with 50% stenosis of the proximal right ICA and 60% stenosis of the proximal left ICA. Aortic Atherosclerosis (ICD10-I70.0) and Emphysema (ICD10-J43.9). Electronically Signed   By: Juanetta Nordmann M.D.   On: 09/22/2023 20:36   CT HEAD CODE STROKE WO CONTRAST Result Date: 09/22/2023 CLINICAL DATA:  Code stroke.  Acute neurologic deficit EXAM: CT HEAD WITHOUT CONTRAST TECHNIQUE: Contiguous axial images were obtained from the base of the skull through the vertex without intravenous contrast. RADIATION DOSE REDUCTION: This exam was performed according to the departmental dose-optimization program  which includes automated exposure control, adjustment of the mA and/or kV according to patient size and/or use of iterative reconstruction technique. COMPARISON:  04/28/2022 FINDINGS: Brain: There is no mass, hemorrhage or extra-axial collection. There is generalized atrophy without lobar predilection. There is hypoattenuation of the periventricular white matter, most commonly indicating chronic ischemic microangiopathy. Vascular: Atherosclerotic calcification of the vertebral and internal carotid arteries at the skull base. No abnormal hyperdensity of the major intracranial arteries or dural venous sinuses. Skull: The visualized skull base, calvarium and extracranial soft tissues are normal. Sinuses/Orbits: Mild ethmoid sinus mucosal thickening. The orbits are normal. ASPECTS [facility] Stroke Program Early CT Score) - Ganglionic level infarction (caudate, lentiform nuclei, internal capsule, insula, M1-M3 cortex): 7 - Supraganglionic infarction (M4-M6 cortex):  3 Total score (0-10 with 10 being normal): 10 IMPRESSION: 1. No acute intracranial abnormality. 2. ASPECTS is 10. These results were communicated to Dr. Baldwin Levee at 8:04 pm on 09/22/2023 by text page via the [facility] messaging system. Electronically Signed   By: Juanetta Nordmann M.D.   On: 09/22/2023 20:05     Procedures   Medications Ordered in the ED  potassium chloride  (KLOR-CON ) packet 40 mEq (has no administration in time range)   stroke: early stages of recovery book (has no administration in time range)  iohexol  (OMNIPAQUE ) 350 MG/ML injection 75 mL (75 mLs Intravenous Contrast Given 09/22/23 2012)                                    Medical Decision Making Amount and/or Complexity of Data Reviewed Labs: ordered.    Details: Hypokalemia Radiology: ordered and independent interpretation performed.    Details: No head bleed ECG/medicine tests: ordered.  Risk Prescription drug management. Decision regarding  hospitalization.   Patient presents with acute trouble speaking.  Activated as a code stroke but given he is on Eliquis  he is not a thrombolytic candidate.  CT angiography shows no large vessel occlusion.  Will need admission given his acute strokelike symptoms.  Discussed with Dr. Carlton Chick.     Final diagnoses:  Expressive aphasia    ED Discharge Orders     None          Jerilynn Montenegro, MD 09/22/23 2255

## 2023-09-22 NOTE — Progress Notes (Signed)
EEG complete. Results pending.  ?

## 2023-09-22 NOTE — ED Notes (Signed)
 Patient's family reports sudden onset expressive aphasia , LKW 5 pm this afternoon , history of CVA with right side deficits . Code stroke initiated at triage , EDP/charge nurse notified.

## 2023-09-22 NOTE — ED Notes (Signed)
 Code Stroke called at 1949. LKW: 1730, DEF: exp aphasia, hx of hemoragic strx.

## 2023-09-22 NOTE — Consult Note (Addendum)
 NEUROLOGY CONSULT NOTE   Date of service: September 22, 2023 Patient Name: Evan [name] MRN:  [id number] DOB:  Jul 03, 1927 Chief Complaint: Stopped talking Requesting Provider: Freddi Hamilton, MD  History of Present Illness  Evan [name] is a 89 y.o. man with a past medical history significant for dementia, coronary artery disease s/p CABG, heart failure with preserved EF, prior hemorrhagic left thalamic stroke with residual right sided weakness (etiology hypertensive versus coagulopathy in the setting of Lovenox  use at the time), hypertension, hyperlipidemia, CKD stage IIIa, COPD, peripheral vascular disease, history of PE (on Eliquis ), multiple lung adenocarcinoma in remission, anxiety, BPH with chronic Foley, rheumatoid arthritis, hemochromatosis, chronic anemia and thrombocytopenia  His son had come to see him at 4 PM and initially the patient was talking normally having eaten dinner then at around 5 PM stopped talking.  However there is lack of clarity as to whether he is having worsening right sided weakness than his normal for him since his prior stroke with different family members disagreeing about his recent baseline level of right sided weakness  They note he has been complaining of some left great toe pain without any evidence of joint swelling or redness or sore recently.  Otherwise denies any acute complaints, though he has had a persistent cough since his last admission that has fluctuated somewhat in severity  They report he has not missed any of his Eliquis   He has not had multiple recent admissions including  5/11 through 5/19 for metapneumovirus pneumonia and heart failure exacerbation 5/21 through 5/22 for altered mental status with right upper extremity hand cellulitis 6/7 - 6/13 for shortness of breath and not feeling well overall treated for sepsis and acute hypoxic respiratory failure secondary to community acquired pneumonia and urine culture growing Pseudomonas and  Klebsiella (treated with cefepime ), also found to have multiple electrolyte derangements including hyponatremia, hypokalemia, and hypomagnesemia  Of note during swallow evaluation last admission he was noted to have fluctuating dysphagia but cleared for regular diet with thin liquids, home meds with pure and supervision for eating  LKW: 5 PM for speech, unclear if her right upper extremity weakness Modified rankin score: 4-Needs assistance to walk and tend to bodily needs IV Thrombolysis: No, on Eliquis  EVT: No, no LVO   NIHSS components Score: Comment  1a Level of Conscious 0[x]  1[]  2[]  3[]      1b LOC Questions 0[]  1[]  2[x]       1c LOC Commands 0[]  1[]  2[x]       2 Best Gaze 0[x]  1[]  2[]       3 Visual 0[x]  1[]  2[]  3[]      4 Facial Palsy 0[]  1[x]  2[]  3[]    Mild right facial droop  5a Motor Arm - left 0[]  1[]  2[x]  3[]  4[]  UN[]    5b Motor Arm - Right 0[]  1[]  2[]  3[x]  4[]  UN[]    6a Motor Leg - Left 0[]  1[]  2[x]  3[]  4[]  UN[]    6b Motor Leg - Right 0[]  1[]  2[]  3[x]  4[]  UN[]    7 Limb Ataxia 0[x]  1[]  2[]  UN[]      8 Sensory 0[x]  1[]  2[]  UN[]      9 Best Language 0[]  1[]  2[x]  3[]      10 Dysarthria 0[]  1[]  2[x]  UN[]      11 Extinct. and Inattention 0[x]  1[]  2[]       TOTAL: 19       ROS  Unable to assess secondary to patient's mental status   Past History   Past Medical History:  Diagnosis  Date   Acute blood loss anemia    Acute bronchitis 05/15/2017   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 07/14/2018   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   Altered mental status 08/21/2018   Anginal chest pain at rest [facility])    Chronic non, controlled on Lorazepam    Anxiety    Arthritis    all over    BPH (benign prostatic hyperplasia)    CAD (coronary artery disease)    Cellulitis of arm, right 06/01/2018   Cellulitis of left axilla    Chronic lower back pain    Complication of anesthesia    problems making water  afterwards   Depression    DJD (degenerative joint disease)    Esophageal ulcer without bleeding    bid ppi indefinitely   Fall 07/16/2018   Family history of adverse reaction to anesthesia    children w/PONV   GERD (gastroesophageal reflux disease)    Haemophilus influenzae septicemia (HCC) 03/12/2023   Headache    couple/week maybe (09/04/2015)   Hemochromatosis    Possible, elevated iron  stores, hook-like osteophytes on hand films, normal LFTs   Hepatitis    yellow jaundice as a baby   History of ASCVD    MULTIVESSEL   Hyperlipidemia    Hypertension    Hypotension    Mild aortic stenosis 06/23/2021   Osteoarthritis    Peripheral vascular disease (HCC)    Pneumonia    several times; got a little now (09/04/2015)   Pulmonary embolism (HCC) 02/02/2018   Rash and nonspecific skin eruption 06/01/2018   Rheumatoid arthritis (HCC)    RSV (respiratory syncytial virus pneumonia) 05/15/2022   Stroke (HCC)    Subdural hematoma (HCC)    small after fall 09/2016-plavix  held, neurosurgery consulted.  Asymptomatic.     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 PO Eliquis     Past Surgical History:  Procedure Laterality Date   ARM SKIN LESION BIOPSY / EXCISION Left 09/02/2015   CATARACT EXTRACTION W/ INTRAOCULAR LENS  IMPLANT, BILATERAL Bilateral    CHOLECYSTECTOMY OPEN     CORNEAL TRANSPLANT Bilateral    one at [facility]; one at [facility]   CORONARY ANGIOPLASTY WITH STENT PLACEMENT     CORONARY ARTERY BYPASS GRAFT  1999   DESCENDING AORTIC ANEURYSM REPAIR W/ STENT     DE stent ostium into the right radial free graft at OM-, 02-2007   ESOPHAGOGASTRODUODENOSCOPY N/A 11/09/2014   Procedure: ESOPHAGOGASTRODUODENOSCOPY (EGD);  Surgeon: Norleen Hint, MD;  Location: [facility] ENDOSCOPY;  Service: Endoscopy;  Laterality: N/A;   INGUINAL HERNIA REPAIR     JOINT REPLACEMENT     LEFT HEART CATH AND CORS/GRAFTS ANGIOGRAPHY N/A 06/27/2017   Procedure: LEFT HEART CATH AND  CORS/GRAFTS ANGIOGRAPHY;  Surgeon: Burnard Debby LABOR, MD;  Location: [facility] INVASIVE CV LAB;  Service: Cardiovascular;  Laterality: N/A;   NASAL SINUS SURGERY     SHOULDER OPEN ROTATOR CUFF REPAIR Right    TOTAL KNEE ARTHROPLASTY Bilateral     Family History: Family History  Problem Relation Age of Onset   Arthritis-Osteo Sister    Heart attack Brother    Heart attack Other    Cancer Neg Hx     Social History  reports that he has quit smoking. His smoking use included cigarettes. He has been exposed to tobacco smoke. He has quit using smokeless tobacco.  His smokeless tobacco use included chew. He reports that he does not drink  alcohol  and does not use drugs.  Allergies  Allergen Reactions   Feldene [Piroxicam] Rash    Blistering rash     Neomycin -Polymyxin-Hc Itching   Statins Other (See Comments)    Myalgias   Latex Rash    Band Aid   Tequin [Gatifloxacin] Swelling and Anxiety   Valium [Diazepam] Other (See Comments)    Dizziness    Vibramycin  [Doxycycline ] Itching and Nausea Only    Medications  No current facility-administered medications for this encounter.  Current Outpatient Medications:    acetaminophen  (TYLENOL ) 325 MG tablet, Take 325-650 mg by mouth 2 (two) times daily as needed for mild pain (pain score 1-3), moderate pain (pain score 4-6), fever or headache., Disp: , Rfl:    apixaban  (ELIQUIS ) 2.5 MG TABS tablet, Take 1 tablet (2.5 mg total) by mouth 2 (two) times daily., Disp: 180 tablet, Rfl: 3   budesonide -formoterol  (SYMBICORT ) 160-4.5 MCG/ACT inhaler, Inhale 2 puffs into the lungs in the morning and at bedtime., Disp: 10.2 g, Rfl: 12   ferrous sulfate  (FEROSUL) 325 (65 FE) MG tablet, TAKE 1 TABLET EVERY DAY, Disp: 90 tablet, Rfl: 0   FLUoxetine  (PROZAC ) 20 MG capsule, TAKE 1 CAPSULE EVERY DAY, Disp: 90 capsule, Rfl: 3   folic acid  (FOLVITE ) 1 MG tablet, Take 1 tablet (1 mg total) by mouth daily., Disp: 30 tablet, Rfl: 0   furosemide  (LASIX ) 40 MG tablet, TAKE  1 TABLET EVERY DAY, Disp: 90 tablet, Rfl: 3   hydrOXYzine  (ATARAX ) 10 MG/5ML syrup, Take 5 mLs (10 mg total) by mouth every 8 (eight) hours as needed for itching., Disp: 30 mL, Rfl: 0   melatonin 3 MG TABS tablet, Take 1 tablet (3 mg total) by mouth at bedtime as needed (insomnia)., Disp: 30 tablet, Rfl: 0   metoprolol  tartrate (LOPRESSOR ) 25 MG tablet, Take 25 mg by mouth daily., Disp: , Rfl:    Multiple Vitamins-Minerals (MENS 50+ MULTIVITAMIN) TABS, Take 1 tablet by mouth daily., Disp: , Rfl:    mupirocin  ointment (BACTROBAN ) 2 %, Apply 1 Application topically 2 (two) times daily., Disp: 22 g, Rfl: 0   nitroGLYCERIN  (NITROSTAT ) 0.4 MG SL tablet, Place 1 tablet (0.4 mg total) under the tongue every 5 (five) minutes as needed for chest pain. (Patient not taking: Reported on 09/09/2023), Disp: 50 tablet, Rfl: 3   nystatin  (MYCOSTATIN /NYSTOP ) powder, Apply a thin layer topically to the affected area 2-3 (two to three) times daily., Disp: 15 g, Rfl: 0   pantoprazole  (PROTONIX ) 40 MG tablet, TAKE 1 TABLET EVERY MORNING, Disp: 90 tablet, Rfl: 3   rosuvastatin  (CRESTOR ) 5 MG tablet, Take 1 tablet (5 mg total) by mouth at bedtime., Disp: , Rfl:    senna-docusate (STIMULANT LAXATIVE) 8.6-50 MG tablet, Take 2 tablets by mouth 2 (two) times daily as needed for constipation, Disp: 60 tablet, Rfl: 2   tamsulosin  (FLOMAX ) 0.4 MG CAPS capsule, Take 1 capsule (0.4 mg total) by mouth daily with supper. (Patient taking differently: Take 0.4 mg by mouth at bedtime.), Disp: 90 capsule, Rfl: 3   Zinc  Oxide (TRIPLE PASTE) 12.8 % ointment, Apply topically 4 (four) times daily as needed (skin barrier). (Patient taking differently: Apply 1 Application topically 2 (two) times daily as needed (skin barrier).), Disp: 56.7 g, Rfl: 0  Vitals   Vitals:   09/22/23 1945  BP: 126/78  Pulse: 81  Resp: 16  SpO2: 96%    There is no height or weight on file to calculate BMI.   Physical Exam  Constitutional: Appears frail and  chronically ill, no acute distress Psych: Affect pleasant, social smile Eyes: No scleral injection HENT: No oropharyngeal obstruction.  Lips slightly bluish twinge, possibly baseline per family MSK: Right leg AFO in place Cardiovascular: Perfusing extremities well Respiratory: Effort normal, non-labored breathing GI: Soft.  No distension. There is no tenderness.  Skin: Skin tear on the right hand that family reports is secondary to getting caught in his wheelchair which is not unusual for him per their report  Neurologic Examination   Mental Status: Patient is awake, alert, initially not following commands, later with speaking directly into his left ear with hearing aid in place and mimicking is able to follow some commands He gives no history.  Essentially nonverbal at first other than stating yes later does state I cannot hear you.  At times has some incomprehensible speech Cranial Nerves: II: Visual Fields are full to blink to threat.  III,IV, VI: EOMI to tracking examiner's face V: Facial sensation is symmetric to eyelash brush  VII: Facial movement with slightly right facial droop VIII: hearing is extremely hard of hearing at baseline, better in the left ear per family when hearing aids are in place Motor: Diffusely low muscle mass.  Trace movement in the right upper and lower extremity to noxious stimulation.  Moves the left upper and lower extremity briefly antigravity but does not maintain these antigravity with rapid drift to the bed Sensory: Grossly equally reactive to touch in all 4 extremities Deep Tendon Reflexes: Diminished patellar and brachioradialis bilaterally Cerebellar: Finger-nose intact with the left upper extremity, which required a great deal of coaching for him to understand the command. Gait:  Deferred, two-person assist at baseline, today family had to carry him into the car    Labs/Imaging/Neurodiagnostic studies   CBC:  Recent Labs  Lab  09/22/23 1957 09/22/23 1959  WBC 6.1  --   NEUTROABS 4.1  --   HGB 8.8* 9.2*  HCT 27.3* 27.0*  MCV 105.4*  --   PLT 100*  --    Basic Metabolic Panel:  Lab Results  Component Value Date   NA 137 09/22/2023   K 3.0 (L) 09/22/2023   CO2 20 (L) 09/15/2023   GLUCOSE 124 (H) 09/22/2023   BUN 20 09/22/2023   CREATININE 0.90 09/22/2023   CALCIUM  8.2 (L) 09/15/2023   GFRNONAA 54 (L) 09/15/2023   GFRAA 54 (L) 06/22/2020   Lipid Panel:  Lab Results  Component Value Date   CHOL 111 09/28/2022   HDL 40 (L) 09/28/2022   LDLCALC 56 09/28/2022   TRIG 75 09/28/2022   CHOLHDL 2.8 09/28/2022     HgbA1c:  Lab Results  Component Value Date   HGBA1C 5.7 (H) 09/28/2022   Urine Drug Screen:     Component Value Date/Time   LABOPIA NONE DETECTED 08/21/2018 1800   COCAINSCRNUR NONE DETECTED 08/21/2018 1800   LABBENZ POSITIVE (A) 08/21/2018 1800   AMPHETMU NONE DETECTED 08/21/2018 1800   THCU NONE DETECTED 08/21/2018 1800   LABBARB NONE DETECTED 08/21/2018 1800    Alcohol  Level     Component Value Date/Time   ETH <10 07/14/2018 0536   INR  Lab Results  Component Value Date   INR 1.4 (H) 09/22/2023   APTT  Lab Results  Component Value Date   APTT 42 (H) 09/22/2023    CT Head without contrast(Personally reviewed): No acute intracranial process  Relatively more atrophy in the left temporal lobe on my review   CT angio  Head and Neck with contrast(Personally reviewed): No LVO, some left carotid stenosis in the neck as well as at the siphon without occlusion  1. No emergent large vessel occlusion. 2. Bilateral carotid bifurcation atherosclerosis with 50% stenosis of the proximal right ICA and 60% stenosis of the proximal left ICA.   ECHO 03/10/2023  1. Left ventricular ejection fraction, by estimation, is 60 to 65%. The  left ventricle has normal function. The left ventricle has no regional  wall motion abnormalities. There is mild left ventricular hypertrophy.  Left  ventricular diastolic parameters  are indeterminate.   2. Right ventricular systolic function is normal. The right ventricular  size is normal. There is moderately elevated pulmonary artery systolic  pressure. The estimated right ventricular systolic pressure is 54.8 mmHg.   3. Left atrial size was severely dilated.   4. Right atrial size was moderately dilated.   5. The mitral valve is normal in structure. Mild mitral valve  regurgitation.   6. Tricuspid valve regurgitation is moderate.   7. The aortic valve is tricuspid. Aortic valve regurgitation is trivial.  Aortic valve sclerosis/calcification is present, without any evidence of  aortic stenosis.   8. The inferior vena cava is normal in size with greater than 50%  respiratory variability, suggesting right atrial pressure of 3 mmHg.   ASSESSMENT   KAENAN JAKE is a 89 y.o. male with a past medical history of multiple vascular risk factors as documented above, presenting with acute onset aphasia.  Differential includes focal seizure given the extent of his left temporal encephalomalacia, stroke, or dementia related fluctuation/toxic/metabolic process.  From a stroke risk perspective he is fairly well medically optimized, echocardiogram would not change my management as he is already indicated for long-term anticoagulation, so do not feel strongly about this being completed inpatient but defer to medical team if it is felt to be needed from a medical perspective.  Does need MRI brain to confirm safety of continuing Eliquis  as well as routine EEG to screen for possible seizure  RECOMMENDATIONS  Emergent recommendations: - CTA head and neck obtained to confirm no LVO/critical stenosis, negative as above  Additional recommendations: -MRI brain with and without contrast -Routine EEG -TSH, thiamine, B12, MMA, folate for other reversible causes of cognitive fluctuation, treat as needed with B12 goal greater than 600 -A1c goal less than  7%, LDL goal less than 70 -Neurology will follow-up MRI brain and routine EEG.  If these studies are reassuring, favor this is related to toxic/metabolic/infectious issue or dementia related fluctuation  Addendum: -MRI brain reviewed, positive for stroke.  1. Truncated and motion degraded examination due to patient altered mental status. 2. Small areas of acute ischemia within both superior frontal lobes, affecting the right MCA territory and left ACA territory. 3. Probable punctate focus of acute ischemia in the left occipital lobe. -Continue to hold Eliquis  at this time -Permissive hypertension to 220/110 -Stroke team to follow tomorrow ______________________________________________________________________   Lola Jernigan MD-PhD [facility] [phone] Available 7 PM to 7 AM, outside of these hours please call Neurologist on call as listed on Amion.  Discussed with ED provider in person and Dr. Sim admitting hospitalist via secure chat

## 2023-09-22 NOTE — ED Provider Triage Note (Signed)
 Emergency Medicine Provider Triage Evaluation Note  Evan [name] , a 89 y.o. male  was evaluated in triage.  Pt complains of expressive aphasia.  Patient is accompanied by family member who provides information.  Patient's last known well was around 5 to 6 PM, he has been unable to speak/answer questions since that time.  History of hemorrhagic stroke with subsequent right upper/lower extremity deficits.  It is unknown if this patient is on blood thinners.  Review of Systems  Positive: As above Negative: As above  Physical Exam  BP 126/78 (BP Location: Right Arm)   Pulse 81   Resp 16   SpO2 96%  Gen:   Awake Resp:  Normal effort  MSK:   Right upper/lower extremity deficits at baseline Other:  Patient is unable to answer questions, when asked his name he is unable to respond, grip strength of the left upper extremity is intact, 5 out of 5 strength against resistance of left lower extremity, no obvious facial droop or asymmetry  Medical Decision Making  Medically screening exam initiated at 8:02 PM.  Appropriate orders placed.  Evan [name] was informed that the remainder of the evaluation will be completed by another provider, this initial triage assessment does not replace that evaluation, and the importance of remaining in the ED until their evaluation is complete.  I evaluated this patient in triage and activated a code stroke based on his presentation   Evan [name] 09/22/23 2004

## 2023-09-22 NOTE — H&P (Signed)
 History and Physical    Patient: Evan [name] FMW:[id number] DOB: [birth year] DOA: 09/22/2023 DOS: the patient was seen and examined on 09/22/2023 PCP: [name], MD  Patient coming from: Home  Chief Complaint:  Chief Complaint  Patient presents with   Aphasia ; LKW 5 pm    HPI: Evan [name] is a 89 y.o. male with medical history significant of dementia, prior history of CVA, history of lung cancer, BPH, coronary artery disease, rheumatoid arthritis, history of subdural hematoma, GERD, depression with anxiety who was brought in from home by family due to aphasia.  Patient apparently was known to be well sometime around 5 PM today.  He was talking normally and he was hearing fine although he does have a hearing aid.  At denied he suddenly was unable to communicate.  Patient on Eliquis  and has had prior hemorrhagic stroke he was seen evaluated and suspected to have acute CVA versus seizure.  Neurology already consulted.  Patient being admitted to the medical service for further evaluation and treatment.  Review of Systems: As mentioned in the history of present illness. All other systems reviewed and are negative. 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   Altered mental status 08/21/2018   Anginal chest pain at rest [facility])    Chronic non, controlled on Lorazepam    Anxiety    Arthritis    all over    BPH (benign prostatic hyperplasia)    CAD (coronary artery disease)    Cellulitis of arm, right 06/01/2018   Cellulitis of left axilla    Chronic lower back pain    Complication of anesthesia    problems making water afterwards   Depression    DJD (degenerative joint disease)    Esophageal ulcer without bleeding    bid ppi indefinitely   Fall 07/16/2018    Family history of adverse reaction to anesthesia    children w/PONV   GERD (gastroesophageal reflux disease)    Haemophilus influenzae septicemia (HCC) 03/12/2023   Headache    couple/week maybe (09/04/2015)   Hemochromatosis    Possible, elevated iron  stores, hook-like osteophytes on hand films, normal LFTs   Hepatitis    yellow jaundice as a baby   History of ASCVD    MULTIVESSEL   Hyperlipidemia    Hypertension    Hypotension    Mild aortic stenosis 06/23/2021   Osteoarthritis    Peripheral vascular disease (HCC)    Pneumonia    several times; got a little now (09/04/2015)   Pulmonary embolism (HCC) 02/02/2018   Rash and nonspecific skin eruption 06/01/2018   Rheumatoid arthritis (HCC)    RSV (respiratory syncytial virus pneumonia) 05/15/2022   Stroke (HCC)    Subdural hematoma (HCC)    small after fall 09/2016-plavix  held, neurosurgery consulted.  Asymptomatic.     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 PO Eliquis    Past Surgical History:  Procedure Laterality Date   ARM SKIN LESION BIOPSY / EXCISION Left 09/02/2015   CATARACT EXTRACTION W/ INTRAOCULAR LENS  IMPLANT, BILATERAL Bilateral    CHOLECYSTECTOMY OPEN     CORNEAL TRANSPLANT Bilateral    one at [facility]; one at [facility]   CORONARY ANGIOPLASTY WITH STENT PLACEMENT     CORONARY ARTERY BYPASS GRAFT  1999  DESCENDING AORTIC ANEURYSM REPAIR W/ STENT     DE stent ostium into the right radial free graft at OM-, 02-2007   ESOPHAGOGASTRODUODENOSCOPY N/A 11/09/2014   Procedure: ESOPHAGOGASTRODUODENOSCOPY (EGD);  Surgeon: Norleen Hint, MD;  Location: [facility] ENDOSCOPY;  Service: Endoscopy;  Laterality: N/A;   INGUINAL HERNIA REPAIR     JOINT REPLACEMENT     LEFT HEART CATH AND CORS/GRAFTS ANGIOGRAPHY N/A 06/27/2017   Procedure: LEFT HEART CATH AND CORS/GRAFTS ANGIOGRAPHY;  Surgeon: Burnard Debby LABOR, MD;  Location: [facility] INVASIVE CV LAB;  Service: Cardiovascular;  Laterality: N/A;   NASAL SINUS SURGERY      SHOULDER OPEN ROTATOR CUFF REPAIR Right    TOTAL KNEE ARTHROPLASTY Bilateral    Social History:  reports that he has quit smoking. His smoking use included cigarettes. He has been exposed to tobacco smoke. He has quit using smokeless tobacco.  His smokeless tobacco use included chew. He reports that he does not drink alcohol  and does not use drugs.  Allergies  Allergen Reactions   Feldene [Piroxicam] Rash    Blistering rash     Neomycin -Polymyxin-Hc Itching   Statins Other (See Comments)    Myalgias   Latex Rash    Band Aid   Tequin [Gatifloxacin] Swelling and Anxiety   Valium [Diazepam] Other (See Comments)    Dizziness    Vibramycin  [Doxycycline ] Itching and Nausea Only    Family History  Problem Relation Age of Onset   Arthritis-Osteo Sister    Heart attack Brother    Heart attack Other    Cancer Neg Hx     Prior to Admission medications   Medication Sig Start Date End Date Taking? Authorizing Provider  acetaminophen  (TYLENOL ) 325 MG tablet Take 325-650 mg by mouth 2 (two) times daily as needed for mild pain (pain score 1-3), moderate pain (pain score 4-6), fever or headache.    [provider]  apixaban  (ELIQUIS ) 2.5 MG TABS tablet Take 1 tablet (2.5 mg total) by mouth 2 (two) times daily. 10/14/22   Wyn Jackee VEAR Mickey., NP  budesonide -formoterol  (SYMBICORT ) 160-4.5 MCG/ACT inhaler Inhale 2 puffs into the lungs in the morning and at bedtime. 06/08/23   [name], MD  ferrous sulfate  (FEROSUL) 325 (65 FE) MG tablet TAKE 1 TABLET EVERY DAY 08/15/23   [name], MD  FLUoxetine  (PROZAC ) 20 MG capsule TAKE 1 CAPSULE EVERY DAY 03/20/23   [name], MD  folic acid  (FOLVITE ) 1 MG tablet Take 1 tablet (1 mg total) by mouth daily. 08/21/23   [name], [name], MD  furosemide  (LASIX ) 40 MG tablet TAKE 1 TABLET EVERY DAY 07/10/23   [name], MD  hydrOXYzine  (ATARAX ) 10 MG/5ML syrup Take 5 mLs (10 mg total) by mouth every 8 (eight) hours as  needed for itching. 08/21/23   [name], Mauricio Daniel, MD  melatonin 3 MG TABS tablet Take 1 tablet (3 mg total) by mouth at bedtime as needed (insomnia). 08/21/23   [name], Mauricio Daniel, MD  metoprolol  tartrate (LOPRESSOR ) 25 MG tablet Take 25 mg by mouth daily.    [provider]  Multiple Vitamins-Minerals (MENS 50+ MULTIVITAMIN) TABS Take 1 tablet by mouth daily.    [provider]  mupirocin  ointment (BACTROBAN ) 2 % Apply 1 Application topically 2 (two) times daily. 07/24/23   Hyatt, [name], DPM  nitroGLYCERIN  (NITROSTAT ) 0.4 MG SL tablet Place 1 tablet (0.4 mg total) under the tongue every 5 (five) minutes as needed for chest pain. Patient not  taking: Reported on 09/09/2023 05/16/23   [name], MD  nystatin  (MYCOSTATIN /NYSTOP ) powder Apply a thin layer topically to the affected area 2-3 (two to three) times daily. 08/03/23     pantoprazole  (PROTONIX ) 40 MG tablet TAKE 1 TABLET EVERY MORNING 09/18/23   [name], MD  rosuvastatin  (CRESTOR ) 5 MG tablet Take 1 tablet (5 mg total) by mouth at bedtime. 08/21/23   [name], Mauricio Daniel, MD  senna-docusate (STIMULANT LAXATIVE) 8.6-50 MG tablet Take 2 tablets by mouth 2 (two) times daily as needed for constipation 07/21/22     tamsulosin  (FLOMAX ) 0.4 MG CAPS capsule Take 1 capsule (0.4 mg total) by mouth daily with supper. Patient taking differently: Take 0.4 mg by mouth at bedtime. 01/26/23   [name], MD  Zinc  Oxide (TRIPLE PASTE) 12.8 % ointment Apply topically 4 (four) times daily as needed (skin barrier). Patient taking differently: Apply 1 Application topically 2 (two) times daily as needed (skin barrier). 05/03/22   [name]., MD    Physical Exam: Vitals:   09/22/23 1945  BP: 126/78  Pulse: 81  Resp: 16  SpO2: 96%   Constitutional: Chronically ill looking and frail, NAD, calm, comfortable Eyes: PERRL, lids and conjunctivae normal ENMT: Mucous membranes are dry. Posterior pharynx clear  of any exudate or lesions.Normal dentition.  Neck: normal, supple, no masses, no thyromegaly Respiratory: clear to auscultation bilaterally, no wheezing, no crackles. Normal respiratory effort. No accessory muscle use.  Cardiovascular: Regular rate and rhythm, no murmurs / rubs / gallops. No extremity edema. 2+ pedal pulses. No carotid bruits.  Abdomen: no tenderness, no masses palpated. No hepatosplenomegaly. Bowel sounds positive.  Musculoskeletal: Good range of motion, no joint swelling or tenderness, Skin: no rashes, lesions, ulcers. No induration Neurologic: CN 2-12 grossly intact. Sensation intact, DTR normal. Strength 5/5 in all 4.  Aphasic Psychiatric: Confused, pleasant and stable  Data Reviewed:  Vitals appear stable, white count 6.1 hemoglobin 9.2 and platelets 100.  Sodium 137 potassium 3.0 chloride 103 CO2 21 calcium  8.6.  INR 1.4,urinalysis is negative.  Urine drug screen negative head CT without contrast  Assessment and Plan:  #1 acute CVA versus metabolic encephalopathy: Patient will be admitted for CVA workup.  Neurology consulted.  Patient already on Eliquis .  Head CT showed no acute findings.  Patient more than likely has metabolic encephalopathy but will get MRI of the brain, no echo necessary per neurology.  Will follow with PT and OT consultation.  EEG also to be ordered for possible seizure.  #2 dementia: Stable no agitation.  Continue to monitor.  #3 chronic atrial fibrillation: Rate appears controlled.  On Eliquis .  Will continue  #4 COPD: No acute exacerbation.  Continue home regimen.  #5 chronic kidney disease stage IIIa: Renal function appears to be at baseline.  Continue to monitor  #6 coronary artery disease: No decompensation.  #7 GERD: Will continue PPIs  #8 hyperlipidemia: Continue statin.    Advance Care Planning:   Code Status: Prior DNR  Consults: Neurologist Dr. Jerrie  Family Communication: Son and wife at bedside  Severity of Illness: The  appropriate patient status for this patient is INPATIENT. Inpatient status is judged to be reasonable and necessary in order to provide the required intensity of service to ensure the patient's safety. The patient's presenting symptoms, physical exam findings, and initial radiographic and laboratory data in the context of their chronic comorbidities is felt to place them at high risk for further clinical deterioration. Furthermore,  it is not anticipated that the patient will be medically stable for discharge from the hospital within 2 midnights of admission.   * I certify that at the point of admission it is my clinical judgment that the patient will require inpatient hospital care spanning beyond 2 midnights from the point of admission due to high intensity of service, high risk for further deterioration and high frequency of surveillance required.*  AuthorBETHA SIM KNOLL, MD 09/22/2023 9:23 PM  For on call review [email or web address].

## 2023-09-23 ENCOUNTER — Observation Stay (HOSPITAL_COMMUNITY)

## 2023-09-23 ENCOUNTER — Encounter (HOSPITAL_COMMUNITY): Payer: Self-pay | Admitting: Internal Medicine

## 2023-09-23 DIAGNOSIS — I69351 Hemiplegia and hemiparesis following cerebral infarction affecting right dominant side: Secondary | ICD-10-CM | POA: Diagnosis not present

## 2023-09-23 DIAGNOSIS — I700 Atherosclerosis of aorta: Secondary | ICD-10-CM | POA: Diagnosis not present

## 2023-09-23 DIAGNOSIS — M069 Rheumatoid arthritis, unspecified: Secondary | ICD-10-CM | POA: Diagnosis present

## 2023-09-23 DIAGNOSIS — Z7901 Long term (current) use of anticoagulants: Secondary | ICD-10-CM

## 2023-09-23 DIAGNOSIS — I480 Paroxysmal atrial fibrillation: Secondary | ICD-10-CM

## 2023-09-23 DIAGNOSIS — I63422 Cerebral infarction due to embolism of left anterior cerebral artery: Secondary | ICD-10-CM | POA: Diagnosis not present

## 2023-09-23 DIAGNOSIS — I6782 Cerebral ischemia: Secondary | ICD-10-CM | POA: Diagnosis not present

## 2023-09-23 DIAGNOSIS — J9612 Chronic respiratory failure with hypercapnia: Secondary | ICD-10-CM | POA: Diagnosis present

## 2023-09-23 DIAGNOSIS — I63511 Cerebral infarction due to unspecified occlusion or stenosis of right middle cerebral artery: Secondary | ICD-10-CM | POA: Diagnosis present

## 2023-09-23 DIAGNOSIS — I5032 Chronic diastolic (congestive) heart failure: Secondary | ICD-10-CM | POA: Diagnosis present

## 2023-09-23 DIAGNOSIS — I6340 Cerebral infarction due to embolism of unspecified cerebral artery: Secondary | ICD-10-CM | POA: Diagnosis not present

## 2023-09-23 DIAGNOSIS — I2510 Atherosclerotic heart disease of native coronary artery without angina pectoris: Secondary | ICD-10-CM | POA: Diagnosis present

## 2023-09-23 DIAGNOSIS — R569 Unspecified convulsions: Secondary | ICD-10-CM

## 2023-09-23 DIAGNOSIS — J9611 Chronic respiratory failure with hypoxia: Secondary | ICD-10-CM | POA: Diagnosis present

## 2023-09-23 DIAGNOSIS — Z66 Do not resuscitate: Secondary | ICD-10-CM | POA: Diagnosis present

## 2023-09-23 DIAGNOSIS — I13 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 failure and stage 1 through stage 4 chronic kidney disease, or unspecified chronic kidney disease: Secondary | ICD-10-CM | POA: Diagnosis present

## 2023-09-23 DIAGNOSIS — I739 Peripheral vascular disease, unspecified: Secondary | ICD-10-CM | POA: Diagnosis present

## 2023-09-23 DIAGNOSIS — N1832 Chronic kidney disease, stage 3b: Secondary | ICD-10-CM | POA: Diagnosis present

## 2023-09-23 DIAGNOSIS — R1310 Dysphagia, unspecified: Secondary | ICD-10-CM | POA: Diagnosis present

## 2023-09-23 DIAGNOSIS — I110 Hypertensive heart disease with heart failure: Secondary | ICD-10-CM

## 2023-09-23 DIAGNOSIS — E785 Hyperlipidemia, unspecified: Secondary | ICD-10-CM | POA: Diagnosis present

## 2023-09-23 DIAGNOSIS — I6389 Other cerebral infarction: Secondary | ICD-10-CM | POA: Diagnosis not present

## 2023-09-23 DIAGNOSIS — R4182 Altered mental status, unspecified: Secondary | ICD-10-CM | POA: Diagnosis not present

## 2023-09-23 DIAGNOSIS — R918 Other nonspecific abnormal finding of lung field: Secondary | ICD-10-CM | POA: Diagnosis not present

## 2023-09-23 DIAGNOSIS — J690 Pneumonitis due to inhalation of food and vomit: Secondary | ICD-10-CM | POA: Diagnosis not present

## 2023-09-23 DIAGNOSIS — K219 Gastro-esophageal reflux disease without esophagitis: Secondary | ICD-10-CM | POA: Diagnosis present

## 2023-09-23 DIAGNOSIS — J449 Chronic obstructive pulmonary disease, unspecified: Secondary | ICD-10-CM | POA: Diagnosis present

## 2023-09-23 DIAGNOSIS — R4701 Aphasia: Secondary | ICD-10-CM | POA: Diagnosis present

## 2023-09-23 DIAGNOSIS — I5030 Unspecified diastolic (congestive) heart failure: Secondary | ICD-10-CM

## 2023-09-23 DIAGNOSIS — Z86711 Personal history of pulmonary embolism: Secondary | ICD-10-CM | POA: Diagnosis not present

## 2023-09-23 DIAGNOSIS — E876 Hypokalemia: Secondary | ICD-10-CM | POA: Diagnosis present

## 2023-09-23 DIAGNOSIS — I4821 Permanent atrial fibrillation: Secondary | ICD-10-CM | POA: Diagnosis present

## 2023-09-23 DIAGNOSIS — I63413 Cerebral infarction due to embolism of bilateral middle cerebral arteries: Secondary | ICD-10-CM | POA: Diagnosis not present

## 2023-09-23 DIAGNOSIS — Z947 Corneal transplant status: Secondary | ICD-10-CM | POA: Diagnosis not present

## 2023-09-23 DIAGNOSIS — F0393 Unspecified dementia, unspecified severity, with mood disturbance: Secondary | ICD-10-CM | POA: Diagnosis present

## 2023-09-23 DIAGNOSIS — R062 Wheezing: Secondary | ICD-10-CM | POA: Diagnosis not present

## 2023-09-23 DIAGNOSIS — I6523 Occlusion and stenosis of bilateral carotid arteries: Secondary | ICD-10-CM | POA: Diagnosis present

## 2023-09-23 DIAGNOSIS — F32A Depression, unspecified: Secondary | ICD-10-CM | POA: Diagnosis present

## 2023-09-23 DIAGNOSIS — I639 Cerebral infarction, unspecified: Secondary | ICD-10-CM | POA: Diagnosis not present

## 2023-09-23 DIAGNOSIS — D696 Thrombocytopenia, unspecified: Secondary | ICD-10-CM | POA: Diagnosis present

## 2023-09-23 LAB — APTT: aPTT: 63 s — ABNORMAL HIGH (ref 24–36)

## 2023-09-23 LAB — LIPID PANEL
Cholesterol: 70 mg/dL (ref 0–200)
HDL: 29 mg/dL — ABNORMAL LOW (ref >40)
LDL Cholesterol: 31 mg/dL (ref 0–99)
Total CHOL/HDL Ratio: 2.4 ratio
Triglycerides: 49 mg/dL (ref <150)
VLDL: 10 mg/dL (ref 0–40)

## 2023-09-23 LAB — HEPARIN LEVEL (UNFRACTIONATED): Heparin Unfractionated: >1.1 [IU]/mL — ABNORMAL HIGH (ref 0.30–0.70)

## 2023-09-23 MED ORDER — DEXTROSE-SODIUM CHLORIDE 5-0.45 % IV SOLN: INTRAVENOUS | Status: DC

## 2023-09-23 MED ORDER — ROSUVASTATIN CALCIUM 5 MG PO TABS
5.0000 mg | ORAL_TABLET | Freq: Every day | ORAL | Status: DC
  Administered 2023-09-24 – 2023-09-25 (×2): 5 mg via ORAL
  Filled 2023-09-23 (×2): qty 1

## 2023-09-23 MED ORDER — MELATONIN 3 MG PO TABS
3.0000 mg | ORAL_TABLET | Freq: Every evening | ORAL | Status: DC | PRN
  Administered 2023-09-25 (×2): 3 mg via ORAL
  Filled 2023-09-23 (×2): qty 1

## 2023-09-23 MED ORDER — FOLIC ACID 1 MG PO TABS
1.0000 mg | ORAL_TABLET | Freq: Every day | ORAL | Status: DC
  Administered 2023-09-25 – 2023-09-26 (×2): 1 mg via ORAL
  Filled 2023-09-23 (×2): qty 1

## 2023-09-23 MED ORDER — ACETAMINOPHEN 325 MG PO TABS
325.0000 mg | ORAL_TABLET | Freq: Two times a day (BID) | ORAL | Status: DC | PRN
  Administered 2023-09-25 – 2023-09-26 (×2): 650 mg via ORAL
  Filled 2023-09-23 (×2): qty 2

## 2023-09-23 MED ORDER — CHLORHEXIDINE GLUCONATE CLOTH 2 % EX PADS
6.0000 | MEDICATED_PAD | Freq: Every day | CUTANEOUS | Status: DC
  Administered 2023-09-23 – 2023-09-26 (×4): 6 via TOPICAL

## 2023-09-23 MED ORDER — PANTOPRAZOLE SODIUM 40 MG PO TBEC
40.0000 mg | DELAYED_RELEASE_TABLET | Freq: Every morning | ORAL | Status: DC
  Administered 2023-09-25 – 2023-09-26 (×2): 40 mg via ORAL
  Filled 2023-09-23 (×2): qty 1

## 2023-09-23 MED ORDER — ALBUTEROL SULFATE (2.5 MG/3ML) 0.083% IN NEBU
2.5000 mg | INHALATION_SOLUTION | Freq: Four times a day (QID) | RESPIRATORY_TRACT | Status: DC | PRN
  Administered 2023-09-24: 2.5 mg via RESPIRATORY_TRACT
  Filled 2023-09-23: qty 3

## 2023-09-23 MED ORDER — TAMSULOSIN HCL 0.4 MG PO CAPS
0.4000 mg | ORAL_CAPSULE | Freq: Every day | ORAL | Status: DC
  Administered 2023-09-24 – 2023-09-25 (×2): 0.4 mg via ORAL
  Filled 2023-09-23 (×2): qty 1

## 2023-09-23 MED ORDER — FLUTICASONE FUROATE-VILANTEROL 200-25 MCG/ACT IN AEPB
1.0000 | INHALATION_SPRAY | Freq: Every day | RESPIRATORY_TRACT | Status: DC
  Administered 2023-09-24 – 2023-09-26 (×3): 1 via RESPIRATORY_TRACT
  Filled 2023-09-23: qty 28

## 2023-09-23 MED ORDER — FLUOXETINE HCL 20 MG PO CAPS
20.0000 mg | ORAL_CAPSULE | Freq: Every day | ORAL | Status: DC
  Administered 2023-09-25 – 2023-09-26 (×2): 20 mg via ORAL
  Filled 2023-09-23 (×2): qty 1

## 2023-09-23 MED ORDER — ADULT MULTIVITAMIN W/MINERALS CH
1.0000 | ORAL_TABLET | Freq: Every day | ORAL | Status: DC
  Administered 2023-09-25 – 2023-09-26 (×2): 1 via ORAL
  Filled 2023-09-23 (×2): qty 1

## 2023-09-23 MED ORDER — SENNOSIDES-DOCUSATE SODIUM 8.6-50 MG PO TABS: 2.0000 | ORAL_TABLET | Freq: Two times a day (BID) | ORAL | Status: DC | PRN

## 2023-09-23 MED ORDER — HEPARIN (PORCINE) 25000 UT/250ML-% IV SOLN
950.0000 [IU]/h | INTRAVENOUS | Status: DC
  Administered 2023-09-23: 750 [IU]/h via INTRAVENOUS
  Filled 2023-09-23: qty 250

## 2023-09-23 MED ORDER — DEXTROSE-SODIUM CHLORIDE 5-0.9 % IV SOLN: INTRAVENOUS | Status: AC

## 2023-09-23 MED ORDER — FERROUS SULFATE 325 (65 FE) MG PO TABS
325.0000 mg | ORAL_TABLET | Freq: Every day | ORAL | Status: DC
  Administered 2023-09-25 – 2023-09-26 (×2): 325 mg via ORAL
  Filled 2023-09-23 (×2): qty 1

## 2023-09-23 NOTE — Evaluation (Signed)
 Physical Therapy Evaluation Patient Details Name: Evan [name] MRN: [id number] DOB: 28-Mar-1928 Today's Date: 09/23/2023  History of Present Illness  89 yo male presents to [facility] on 6/20 from home due to aphasia. MRI demonstrated small areas of acute ischemia within both superior frontal lobes.  Recent admissions to [facility] from 5/11-19 for metapneumovirus pneumonia and heart failure exacerbation; 5/21-5/22 for AMS and R hand cellulitis. And 6/7 for SOB with aspiration Pneumonia. PMH arthritis, bronchitis, L thalamic hemorrhage (2020), BPH, Cellulitis RUE, CAD, LBP, DJD, PE, RA, bilat TKA.  Clinical Impression  Pt admitted with above. PTA, pt lives with his children, requires assist for all ADL's and stand pivot transfers to wheelchair using gait belt. Pt presents with impaired communication, R hemiplegia (residual weakness from prior stroke as well), increased tone, decreased sitting balance. Pt requiring moderate assist for bed mobility and +2 for sit to stands from edge of bed with R knee block. Deferred transfer to chair as pt son states pt has been up all night and would like to rest. Pt family able to provided needed assist upon d/c. Will continue to follow acutely to address deficits and maximize functional mobility.       If plan is discharge home, recommend the following: A lot of help with walking and/or transfers;A lot of help with bathing/dressing/bathroom;Assist for transportation;Help with stairs or ramp for entrance   Can travel by private vehicle        Equipment Recommendations None recommended by PT (equipped)  Recommendations for Other Services       Functional Status Assessment Patient has had a recent decline in their functional status and demonstrates the ability to make significant improvements in function in a reasonable and predictable amount of time.     Precautions / Restrictions Precautions Precautions: Fall Recall of Precautions/Restrictions: Impaired Required Braces  or Orthoses: Other Brace Other Brace: R AFO in room Restrictions Weight Bearing Restrictions Per Provider Order: No      Mobility  Bed Mobility Overal bed mobility: Needs Assistance Bed Mobility: Supine to Sit, Sit to Supine     Supine to sit: Mod assist Sit to supine: Mod assist   General bed mobility comments: Cues for task initiation and sequencing, assist for R sided management and trunk to upright. BLE elevation back into bed    Transfers Overall transfer level: Needs assistance Equipment used: None Transfers: Sit to/from Stand Sit to Stand: Mod assist, +2 physical assistance           General transfer comment: 2 standing bouts from edge of bed with modA + 2 and R knee block. Pt bracing back onto bed    Ambulation/Gait                  Stairs            Wheelchair Mobility     Tilt Bed    Modified Rankin (Stroke Patients Only) Modified Rankin (Stroke Patients Only) Pre-Morbid Rankin Score: Severe disability Modified Rankin: Severe disability     Balance Overall balance assessment: Needs assistance Sitting-balance support: Feet supported Sitting balance-[name] Scale: Poor Sitting balance - Comments: Right lateral and posterior lean, able to correct for brief periods with tactile and verbal cueing                                     Pertinent Vitals/Pain Pain Assessment Pain Assessment: Faces Faces Pain Scale: Hurts  a little bit Pain Location: RUE Pain Descriptors / Indicators: Discomfort Pain Intervention(s): Monitored during session    Home Living Family/patient expects to be discharged to:: Private residence Living Arrangements: Children Available Help at Discharge: Available 24 hours/day Type of Home: House Home Access: Ramped entrance       Home Layout: Two level;Able to live on main level with bedroom/bathroom Home Equipment: Shower seat;Grab bars - tub/shower;Hospital bed;Wheelchair - manual;Hand held  shower head;Other (comment) Additional Comments: uses Bil platform RW at baseline; R metal upright AFO    Prior Function Prior Level of Function : Needs assist             Mobility Comments: +1 with family member for stand pivot transfers, gets up to wheelchair around 10:00 and eats breakfast, and then typically goes back to bed. Gets back up around 5:00 ADLs Comments: Family assists with all ADLs; minA for feeding. Uses diapers and changing him in the bed     Extremity/Trunk Assessment   Upper Extremity Assessment Upper Extremity Assessment: Defer to OT evaluation    Lower Extremity Assessment Lower Extremity Assessment: RLE deficits/detail RLE Deficits / Details: Hemiplegia following prior CVA, ROM WFL, reactive to light touch stimulus    Cervical / Trunk Assessment Cervical / Trunk Assessment: Kyphotic  Communication   Communication Communication: Impaired Factors Affecting Communication: Hearing impaired;Other (comment) (hypophonic)    Cognition Arousal: Alert Behavior During Therapy: Flat affect   PT - Cognitive impairments: History of cognitive impairments                       PT - Cognition Comments: cooperative Following commands: Impaired Following commands impaired: Follows one step commands inconsistently, Follows one step commands with increased time     Cueing Cueing Techniques: Verbal cues, Tactile cues, Visual cues     General Comments      Exercises Other Exercises Other Exercises: Sitting: weightbearing through R hand with elbow stabilization   Assessment/Plan    PT Assessment Patient needs continued PT services  PT Problem List Decreased strength;Decreased mobility;Decreased activity tolerance;Decreased balance;Decreased knowledge of use of DME;Decreased safety awareness;Decreased knowledge of precautions;Pain;Decreased skin integrity       PT Treatment Interventions DME instruction;Therapeutic exercise;Gait training;Balance  training;Functional mobility training;Therapeutic activities;Patient/family education;Stair training    PT Goals (Current goals can be found in the Care Plan section)  Acute Rehab PT Goals Patient Stated Goal: home PT Goal Formulation: With patient/family Time For Goal Achievement: 10/07/23 Potential to Achieve Goals: Fair    Frequency Min 2X/week     Co-evaluation               AM-PAC PT 6 Clicks Mobility  Outcome Measure Help needed turning from your back to your side while in a flat bed without using bedrails?: A Lot Help needed moving from lying on your back to sitting on the side of a flat bed without using bedrails?: A Lot Help needed moving to and from a bed to a chair (including a wheelchair)?: A Lot Help needed standing up from a chair using your arms (e.g., wheelchair or bedside chair)?: A Lot Help needed to walk in hospital room?: Total Help needed climbing 3-5 steps with a railing? : Total 6 Click Score: 10    End of Session Equipment Utilized During Treatment: Gait belt;Other (comment) (R AFO) Activity Tolerance: Patient tolerated treatment well Patient left: in bed;with call bell/phone within reach;with bed alarm set Nurse Communication: Mobility status PT Visit Diagnosis: Other  abnormalities of gait and mobility (R26.89);Ataxic gait (R26.0)    Time: 0930-1004 PT Time Calculation (min) (ACUTE ONLY): 34 min   Charges:   PT Evaluation $PT Eval Moderate Complexity: 1 Mod PT Treatments $Therapeutic Activity: 8-22 mins PT General Charges $$ ACUTE PT VISIT: 1 Visit         Aleck Daring, PT, DPT Acute Rehabilitation Services Office [phone]   SPENCE SOBERANO 09/23/2023, 11:23 AM

## 2023-09-23 NOTE — Progress Notes (Addendum)
 PROGRESS NOTE  Evan [name] FMW:[id number] DOB: 08-25-1927 DOA: 09/22/2023 PCP: Evan [name] DASEN, MD   LOS: 0 days   Brief Narrative / Interim history:  89 y.o. male with medical history significant of dementia, prior history of CVA, history of lung cancer, BPH, coronary artery disease, rheumatoid arthritis, history of subdural hematoma, GERD, depression with anxiety who was brought in from home by family due to aphasia.  Around 5 PM the day of admission, all of a sudden he was unable to communicate.  Family brought him to the hospital and neurology was consulted.  An MRI of the brain showed small areas of acute ischemia within both superior frontal lobes in the right MCA and left ACA territories.  Subjective / 24h Interval events: He tells me he is doing all right this morning.  He is very hard of hearing but able to verbalize and give me some answers, but mostly tells me I cannot hear well.  Denies any pain  Assesement and Plan: Principal Problem:   CVA (cerebral vascular accident) (HCC) Active Problems:   Gastro-esophageal reflux disease without esophagitis   Hyperlipidemia, unspecified   S/P CABG (coronary artery bypass graft)   Permanent atrial fibrillation (HCC)   Rheumatoid arthritis, unspecified (HCC)   HFmrEF (heart failure with mildly reduced ejection fraction) (HCC)   Chronic respiratory failure, unsp w hypoxia or hypercapnia (HCC)   Essential hypertension   CKD stage 3b, GFR 30-44 ml/min (HCC) - baseline SCr 1.2-1.5   COPD (chronic obstructive pulmonary disease) (HCC)   Dementia without behavioral disturbance (HCC)  Principal problem Acute CVA -patient was admitted to the hospital with sudden onset expressive aphasia.  MRI of the brain on admission showed acute ischemia within both superior frontal lobes, on the right MCA and left ACA territories, and probable punctate focus of acute ischemia in the left occipital lobe.  Neurology consulted, discussed with Dr. Jerri,  appreciate input - Continue stroke workup, lipid panel shows an LDL of 31.  A1c is 5.7.  A 2D echocardiogram is pending, therapy evaluation pending.  Appears to have persistent symptoms with some expressive aphasia although appears improved - Still n.p.o., speech to consult, if he fails swallow eval may need more in-depth goals of care discussion  Active problems Chronic atrial fibrillation-appears rate controlled, on Eliquis  2.5 mg at home.  In my preliminary discussion with neurology, we are to continue the same dosing  Essential hypertension-allow permissive hypertension for now, currently he is normotensive on no home agents  Hypokalemia-replenish potassium and continue to monitor  History of COPD-no wheezing, no exacerbation.  Continue home medication  Dementia, depression-continue fluoxetine   Hyperlipidemia-continue statin  BPH-continue tamsulosin   CKD 3A-creatinine 0.9-1.2 at baseline, currently at baseline  CAD, status post CABG -no chest pain, stable, continue statin  Anemia, thrombocytopenia-chronic, stable, no bleeding  Goals of care-patient did not have a code when admitted, discussed with the patient's son, he was DNR prior to admissions and he confirms that this is to be continued now.  Changed to DNR  Scheduled Meds:  ferrous sulfate   325 mg Oral Daily   FLUoxetine   20 mg Oral Daily   fluticasone  furoate-vilanterol  1 puff Inhalation Daily   folic acid   1 mg Oral Daily   Mens 50+ Multivitamin  1 tablet Oral Daily   pantoprazole   40 mg Oral q morning   potassium chloride   40 mEq Oral Once   rosuvastatin   5 mg Oral QHS   tamsulosin   0.4 mg Oral QPC  supper   Continuous Infusions: PRN Meds:.acetaminophen , melatonin, senna-docusate  Current Outpatient Medications  Medication Instructions   acetaminophen  (TYLENOL ) 325-650 mg, 2 times daily PRN   apixaban  (ELIQUIS ) 2.5 mg, Oral, 2 times daily   budesonide -formoterol  (SYMBICORT ) 160-4.5 MCG/ACT inhaler 2 puffs,  Inhalation, 2 times daily   FeroSul 325 mg, Oral, Daily   FLUoxetine  (PROZAC ) 20 mg, Oral, Daily   folic acid  (FOLVITE ) 1 mg, Oral, Daily   furosemide  (LASIX ) 40 mg, Oral, Daily   hydrOXYzine  (ATARAX ) 10 mg, Oral, Every 8 hours PRN   melatonin 3 mg, Oral, At bedtime PRN   metoprolol  tartrate (LOPRESSOR ) 25 mg, Daily   Multiple Vitamins-Minerals (MENS 50+ MULTIVITAMIN) TABS 1 tablet, Daily   mupirocin  ointment (BACTROBAN ) 2 % 1 Application, Topical, 2 times daily   nitroGLYCERIN  (NITROSTAT ) 0.4 mg, Sublingual, Every 5 min PRN   nystatin  (MYCOSTATIN /NYSTOP ) powder Apply a thin layer topically to the affected area 2-3 (two to three) times daily.   pantoprazole  (PROTONIX ) 40 mg, Oral, Every morning   rosuvastatin  (CRESTOR ) 5 mg, Oral, Daily at bedtime   senna-docusate (STIMULANT LAXATIVE) 8.6-50 MG tablet Take 2 tablets by mouth 2 (two) times daily as needed for constipation   tamsulosin  (FLOMAX ) 0.4 mg, Oral, Daily with supper   Zinc  Oxide (TRIPLE PASTE) 12.8 % ointment Topical, 4 times daily PRN    Diet Orders (From admission, onward)     Start     Ordered   09/22/23 2118  Diet NPO time specified  Diet effective now        09/22/23 2118            DVT prophylaxis:    Lab Results  Component Value Date   PLT 100 (L) 09/22/2023      Code Status: Limited: Do not attempt resuscitation (DNR) -DNR-LIMITED -Do Not Intubate/DNI   Family Communication: Son present at bedside  Status is: Observation The patient will require care spanning > 2 midnights and should be moved to inpatient because: Some persistent neurological symptoms with expressive aphasia, workup in progress  Level of care: Telemetry Medical  Consultants:  Neurology  Objective: Vitals:   09/23/23 0230 09/23/23 0245 09/23/23 0547 09/23/23 0735  BP: 124/63 (!) 119/58 133/68 108/63  Pulse: 75 82 78 81  Resp: 18 (!) 24 20 19   Temp:   98.1 F (36.7 C) 98.3 F (36.8 C)  TempSrc:   Oral   SpO2: 97% 93% 96% 94%   Height:        Intake/Output Summary (Last 24 hours) at 09/23/2023 9090 Last data filed at 09/23/2023 0200 Gross per 24 hour  Intake --  Output 1800 ml  Net -1800 ml   Wt Readings from Last 3 Encounters:  09/09/23 58.5 kg  09/08/23 58.5 kg  08/25/23 58.5 kg    Examination:  Constitutional: NAD Eyes: no scleral icterus ENMT: Mucous membranes are moist.  Neck: normal, supple Respiratory: clear to auscultation bilaterally, no wheezing, no crackles. Normal respiratory effort.  Cardiovascular: Regular rate and rhythm, no murmurs / rubs / gallops. No LE edema.  Abdomen: non distended, no tenderness. Bowel sounds positive.  Musculoskeletal: no clubbing / cyanosis.   Data Reviewed: I have independently reviewed following labs and imaging studies   CBC Recent Labs  Lab 09/22/23 1957 09/22/23 1959  WBC 6.1  --   HGB 8.8* 9.2*  HCT 27.3* 27.0*  PLT 100*  --   MCV 105.4*  --   MCH 34.0  --   MCHC 32.2  --  RDW 14.9  --   LYMPHSABS 1.5  --   MONOABS 0.4  --   EOSABS 0.0  --   BASOSABS 0.0  --     Recent Labs  Lab 09/22/23 1957 09/22/23 1959 09/22/23 2152  NA 136 137  --   K 3.1* 3.0*  --   CL 106 103  --   CO2 20*  --   --   GLUCOSE 118* 124*  --   BUN 21 20  --   CREATININE 1.02 0.90  --   CALCIUM  8.6*  --   --   AST 22  --   --   ALT 13  --   --   ALKPHOS 86  --   --   BILITOT 0.4  --   --   ALBUMIN 2.7*  --   --   MG  --   --  1.7  INR 1.4*  --   --   TSH  --   --  2.215  HGBA1C  --   --  5.7*    ------------------------------------------------------------------------------------------------------------------ Recent Labs    09/23/23 0447  CHOL 70  HDL 29*  LDLCALC 31  TRIG 49  CHOLHDL 2.4    Lab Results  Component Value Date   HGBA1C 5.7 (H) 09/22/2023   ------------------------------------------------------------------------------------------------------------------ Recent Labs    09/22/23 2152  TSH 2.215    Cardiac Enzymes No  results for input(s): CKMB, TROPONINI, MYOGLOBIN in the last 168 hours.  Invalid input(s): CK ------------------------------------------------------------------------------------------------------------------    Component Value Date/Time   BNP 1,666.8 (H) 09/13/2023 1200   BNP 802 (H) 08/10/2023 1633    CBG: Recent Labs  Lab 09/22/23 1944  GLUCAP 132*    No results found for this or any previous visit (from the past 240 hours).   Radiology Studies: EEG adult Result Date: 09/23/2023 Shelton Arlin KIDD, MD     09/23/2023  6:31 AM Patient Name: JONTUE CRUMPACKER MRN: [id number] Epilepsy Attending: Arlin KIDD Shelton Referring Physician/Provider: Jerrie Lola CROME, MD Date: 09/22/2023 Duration: 21.20 mins Patient history:  89 y.o. male with a past medical history of multiple vascular risk factors as documented above, presenting with acute onset aphasia. EEG to evaluate for seizure. Level of alertness: Awake AEDs during EEG study: None Technical aspects: This EEG study was done with scalp electrodes positioned according to the 10-20 International system of electrode placement. Electrical activity was reviewed with band pass filter of 1-70Hz , sensitivity of 7 uV/mm, display speed of 78mm/sec with a 60Hz  notched filter applied as appropriate. EEG data were recorded continuously and digitally stored.  Video monitoring was available and reviewed as appropriate. Description: EEG showed continuous generalized 3 to 6 Hz theta-delta slowing. Hyperventilation and photic stimulation were not performed.   ABNORMALITY - Continuous slow, generalized IMPRESSION: This study is suggestive of moderate diffuse encephalopathy. No seizures or epileptiform discharges were seen throughout the recording. Arlin KIDD Shelton   MR BRAIN WO CONTRAST Result Date: 09/23/2023 CLINICAL DATA:  Delirium EXAM: MRI HEAD WITHOUT CONTRAST TECHNIQUE: Multiplanar, multiecho pulse sequences of the brain and surrounding structures were  obtained without intravenous contrast. COMPARISON:  None Available. FINDINGS: Truncated and motion degraded examination due to patient altered mental status and inability to remain still. Only diffusion-weighted imaging and axial T2-weighted FLAIR sequence were performed. There are small areas of acute ischemia within both superior frontal lobes, affecting the right MCA territory and left ACA territory. Probable punctate focus of acute ischemia in the left occipital lobe.  There is multifocal hyperintense T2-weighted signal within the periventricular and deep white matter. IMPRESSION: 1. Truncated and motion degraded examination due to patient altered mental status. 2. Small areas of acute ischemia within both superior frontal lobes, affecting the right MCA territory and left ACA territory. 3. Probable punctate focus of acute ischemia in the left occipital lobe. Electronically Signed   By: Franky Stanford M.D.   On: 09/23/2023 03:40   CT ANGIO HEAD NECK W WO CM (CODE STROKE) Result Date: 09/22/2023 CLINICAL DATA:  Aphasia EXAM: CT ANGIOGRAPHY HEAD AND NECK WITH AND WITHOUT CONTRAST TECHNIQUE: Multidetector CT imaging of the head and neck was performed using the standard protocol during bolus administration of intravenous contrast. Multiplanar CT image reconstructions and MIPs were obtained to evaluate the vascular anatomy. Carotid stenosis measurements (when applicable) are obtained utilizing NASCET criteria, using the distal internal carotid diameter as the denominator. RADIATION DOSE REDUCTION: This exam was performed according to the departmental dose-optimization program which includes automated exposure control, adjustment of the mA and/or kV according to patient size and/or use of iterative reconstruction technique. CONTRAST:  75mL OMNIPAQUE  IOHEXOL  350 MG/ML SOLN COMPARISON:  07/14/2018 FINDINGS: CTA NECK FINDINGS Skeleton: No acute abnormality or high grade bony spinal canal stenosis. Other neck: Normal  pharynx, larynx and major salivary glands. No cervical lymphadenopathy. Unremarkable thyroid  gland. Upper chest: Emphysema and unchanged right paratracheal adenopathy. Aortic arch: There is calcific atherosclerosis of the aortic arch. Conventional 3 vessel aortic branching pattern. RIGHT carotid system: No dissection, occlusion or aneurysm. There is mixed density atherosclerosis extending into the proximal ICA, resulting in 50% stenosis. LEFT carotid system: Bulky, circumferential, mixed density atherosclerotic disease at the left carotid bifurcation extending into the proximal external and internal carotid arteries. There is 60% proximal left ICA stenosis. Vertebral arteries: Left dominant configuration. There is no dissection, occlusion or flow-limiting stenosis to the skull base (V1-V3 segments). CTA HEAD FINDINGS POSTERIOR CIRCULATION: Mild atherosclerosis of the left vertebral artery V4 segment. Right vertebral artery is diminutive but patent. No proximal occlusion of the anterior or inferior cerebellar arteries. Basilar artery is normal. Superior cerebellar arteries are normal. Posterior cerebral arteries are normal. ANTERIOR CIRCULATION: Atherosclerotic calcification of the internal carotid arteries at the skull base without hemodynamically significant stenosis. Azygous anterior cerebral artery. Middle cerebral arteries are normal. Venous sinuses: As permitted by contrast timing, patent. Anatomic variants: Fetal origins of both posterior cerebral arteries. Review of the MIP images confirms the above findings. IMPRESSION: 1. No emergent large vessel occlusion. 2. Bilateral carotid bifurcation atherosclerosis with 50% stenosis of the proximal right ICA and 60% stenosis of the proximal left ICA. Aortic Atherosclerosis (ICD10-I70.0) and Emphysema (ICD10-J43.9). Electronically Signed   By: Franky Stanford M.D.   On: 09/22/2023 20:36   CT HEAD CODE STROKE WO CONTRAST Result Date: 09/22/2023 CLINICAL DATA:  Code  stroke.  Acute neurologic deficit EXAM: CT HEAD WITHOUT CONTRAST TECHNIQUE: Contiguous axial images were obtained from the base of the skull through the vertex without intravenous contrast. RADIATION DOSE REDUCTION: This exam was performed according to the departmental dose-optimization program which includes automated exposure control, adjustment of the mA and/or kV according to patient size and/or use of iterative reconstruction technique. COMPARISON:  04/28/2022 FINDINGS: Brain: There is no mass, hemorrhage or extra-axial collection. There is generalized atrophy without lobar predilection. There is hypoattenuation of the periventricular white matter, most commonly indicating chronic ischemic microangiopathy. Vascular: Atherosclerotic calcification of the vertebral and internal carotid arteries at the skull base. No abnormal hyperdensity of the  major intracranial arteries or dural venous sinuses. Skull: The visualized skull base, calvarium and extracranial soft tissues are normal. Sinuses/Orbits: Mild ethmoid sinus mucosal thickening. The orbits are normal. ASPECTS [facility] Stroke Program Early CT Score) - Ganglionic level infarction (caudate, lentiform nuclei, internal capsule, insula, M1-M3 cortex): 7 - Supraganglionic infarction (M4-M6 cortex): 3 Total score (0-10 with 10 being normal): 10 IMPRESSION: 1. No acute intracranial abnormality. 2. ASPECTS is 10. These results were communicated to Dr. Lola Jernigan at 8:04 pm on 09/22/2023 by text page via the [employer] messaging system. Electronically Signed   By: Franky Stanford M.D.   On: 09/22/2023 20:05     Nilda Fendt, MD, PhD [facility]  Between 7 am - 7 pm I am available, please contact me via Amion (for emergencies) or Securechat (non urgent messages)  Between 7 pm - 7 am I am not available, please contact night coverage MD/APP via Amion

## 2023-09-23 NOTE — Progress Notes (Signed)
 Admission Notes:  05:20H - Received patient from ED on bed, accompanied by nurse tech and his son. Alert and awake. With foley and PIV g#[id number] on left forearm. Safety protocol initiated: bed wheels locked, side rails up and call bell within reach. Hooked to telebox [id number]

## 2023-09-23 NOTE — Evaluation (Signed)
 Occupational Therapy Evaluation Patient Details Name: Evan [name] MRN: [id number] DOB: [birth year] Today's Date: 09/23/2023   History of Present Illness   89 yo male presents to [facility] on 6/20 from home due to aphasia. MRI demonstrated small areas of acute ischemia within both superior frontal lobes.  Recent admissions to [facility] from 5/11-19 for metapneumovirus pneumonia and heart failure exacerbation; 5/21-5/22 for AMS and R hand cellulitis. And 6/7 for SOB with aspiration Pneumonia. PMH arthritis, bronchitis, L thalamic hemorrhage (2020), BPH, Cellulitis RUE, CAD, LBP, DJD, PE, RA, bilat TKA.     Clinical Impressions Pt admitted for above, PTA pt's family report that he was able to transfer with RW and was able to self feed while they assist with bathing and dressing ADLs. Pt currently presenting with R sided hemiplegia, which is worse per family report and is noted to have limited muscle activation with strong flexor tone present in listed areas. Pt currently requiring Total A to mod A for ADLs, was able to rise with min A +2 using stedy today but anticipate more assist needed with STS using RW given balance and strength deficits. OT to continue to progress pt as able. Pt has strong support from family and they would like to pursue CIR to give pt the best chance of returning close to baseline and look to have pt return home at DC.      If plan is discharge home, recommend the following:   Two people to help with walking and/or transfers;Two people to help with bathing/dressing/bathroom;Assistance with cooking/housework;Direct supervision/assist for medications management;Direct supervision/assist for financial management;Assist for transportation;Help with stairs or ramp for entrance;Supervision due to cognitive status     Functional Status Assessment   Patient has had a recent decline in their functional status and demonstrates the ability to make significant improvements in function in a  reasonable and predictable amount of time.     Equipment Recommendations   [employer];Hospital bed     Recommendations for Other Services         Precautions/Restrictions   Precautions Precautions: Fall Recall of Precautions/Restrictions: Impaired Required Braces or Orthoses: Other Brace Other Brace: R AFO in room Restrictions Weight Bearing Restrictions Per Provider Order: No     Mobility Bed Mobility Overal bed mobility: Needs Assistance Bed Mobility: Supine to Sit, Sit to Supine     Supine to sit: Mod assist Sit to supine: Mod assist   General bed mobility comments: cues to sequence bed mobiltiy with pt assisting using L side.    Transfers Overall transfer level: Needs assistance Equipment used: Ambulation equipment used Transfers: Sit to/from Stand Sit to Stand: Min assist, Via lift equipment, +2 physical assistance           General transfer comment: min A +2 to rise with stedy, assist to position RUE on stedy handles.      Balance Overall balance assessment: Needs assistance Sitting-balance support: Feet supported Sitting balance-[name] Scale: Poor Sitting balance - Comments: R lateral lean, mod A static sitting balance. Postural control: Right lateral lean   Standing balance-[name] Scale: Zero Standing balance comment: external support required                           ADL either performed or assessed with clinical judgement   ADL Overall ADL's : Needs assistance/impaired Eating/Feeding: NPO   Grooming: Sitting;Moderate assistance   Upper Body Bathing: Sitting;Moderate assistance;Bed level   Lower Body Bathing: Sitting/lateral leans;Maximal assistance  Upper Body Dressing : Sitting;Maximal assistance;Bed level   Lower Body Dressing: Sit to/from stand;+2 for safety/equipment;+2 for physical assistance;Total assistance   Toilet Transfer: +2 for physical assistance;+2 for safety/equipment;Moderate assistance Toilet Transfer  Details (indicate cue type and reason): does better with [facility] Toileting- Clothing Manipulation and Hygiene: Total assistance;+2 for physical assistance               Vision Baseline Vision/History: 0 No visual deficits (per family report.) Eye Alignment:  (L eye appears to be more medial) Ocular Range of Motion: Within Functional Limits Alignment/Gaze Preference: Within Defined Limits Tracking/Visual Pursuits: Able to track stimulus in all quads without difficulty Convergence: Impaired (comment) (did not notice eyes converge, 2 attempts. perhaps with decreased attention) Visual Fields: No apparent deficits Additional Comments: Pt slow to respond but accurate assessment of # of therapists fingers.     Perception         Praxis         Pertinent Vitals/Pain Pain Assessment Pain Assessment: Faces Faces Pain Scale: No hurt Pain Intervention(s): Monitored during session     Extremity/Trunk Assessment Upper Extremity Assessment Upper Extremity Assessment: Right hand dominant;Generalized weakness;LUE deficits/detail RUE Deficits / Details: hemiparesis from prior CVA, worse now per family report. RUE moving in flexor synergy pattern, trace traps activation. no grasp but firing of hand musculaturature for digit flexors/extensors, 3/4 MAS elbow flexor tone. Min activation of biceps RUE Sensation: WNL (he reports similar on both sides) RUE Coordination: decreased fine motor;decreased gross motor LUE Deficits / Details: generally weak, ROM functional to hold stedy bars and touch face. LUE Sensation: WNL   Lower Extremity Assessment RLE Deficits / Details: Hemiplegia following prior CVA, ROM WFL, reactive to light touch stimulus       Communication Communication Communication: Impaired Factors Affecting Communication: Hearing impaired;Other (comment);Difficulty expressing self (hypophonic)   Cognition Arousal: Alert Behavior During Therapy: Flat affect Cognition: History of  cognitive impairments, Difficult to assess Difficult to assess due to: Impaired communication           OT - Cognition Comments: dementia at baseline                 Following commands: Impaired Following commands impaired: Follows one step commands inconsistently, Follows one step commands with increased time     Cueing  General Comments   Cueing Techniques: Verbal cues;Tactile cues;Visual cues  Pt family present and supportive during therapy session. Had discussion with family and MD regarding rehab options   Exercises Other Exercises Other Exercises: PNF D1 flex/ext RUE   Shoulder Instructions      Home Living Family/patient expects to be discharged to:: Private residence Living Arrangements: Children Available Help at Discharge: Available 24 hours/day Type of Home: House Home Access: Ramped entrance     Home Layout: Two level;Able to live on main level with bedroom/bathroom     Bathroom Shower/Tub: Producer, television/film/video: Handicapped height Bathroom Accessibility: No   Home Equipment: Shower seat;Grab bars - tub/shower;Hospital bed;Wheelchair - manual;Hand held shower head;Other (comment)   Additional Comments: uses Bil platform RW at baseline; R metal upright AFO      Prior Functioning/Environment Prior Level of Function : Needs assist             Mobility Comments: +1 with family member for stand pivot transfers, gets up to wheelchair around 10:00 and eats breakfast, and then typically goes back to bed. Gets back up around 5:00 ADLs Comments: Family assists with all ADLs;  minA for feeding. Uses diapers and changing him in the bed    OT Problem List: Decreased strength;Decreased activity tolerance;Impaired balance (sitting and/or standing);Decreased range of motion;Decreased coordination;Decreased cognition;Decreased knowledge of use of DME or AE;Decreased knowledge of precautions;Pain;Impaired UE functional use;Impaired tone   OT  Treatment/Interventions: Self-care/ADL training;Therapeutic exercise;DME and/or AE instruction;Therapeutic activities;Cognitive remediation/compensation;Patient/family education;Balance training;Neuromuscular education      OT Goals(Current goals can be found in the care plan section)   Acute Rehab OT Goals Patient Stated Goal: To get into rehab OT Goal Formulation: With family Time For Goal Achievement: 10/07/23 Potential to Achieve Goals: Fair ADL Goals Pt Will Perform Grooming: sitting;with min assist Pt Will Perform Upper Body Bathing: sitting;with min assist Pt Will Perform Upper Body Dressing: sitting;with caregiver independent in assisting (using hemi dressing techniques) Pt Will Transfer to Toilet: stand pivot transfer;bedside commode;with min assist Additional ADL Goal #3: Pt/family will demonstrate appropriate splint wear and care to protect hemiplegic RUE.   OT Frequency:  Min 2X/week    Co-evaluation              AM-PAC OT 6 Clicks Daily Activity     Outcome Measure Help from another person eating meals?: Total (NPO) Help from another person taking care of personal grooming?: A Lot Help from another person toileting, which includes using toliet, bedpan, or urinal?: Total Help from another person bathing (including washing, rinsing, drying)?: A Lot Help from another person to put on and taking off regular upper body clothing?: A Lot Help from another person to put on and taking off regular lower body clothing?: Total 6 Click Score: 9   End of Session Equipment Utilized During Treatment: Gait belt Nurse Communication: Need for lift equipment  Activity Tolerance: Patient tolerated treatment well Patient left: in bed;with call bell/phone within reach;with bed alarm set;with family/visitor present  OT Visit Diagnosis: Other abnormalities of gait and mobility (R26.89);Muscle weakness (generalized) (M62.81);Other symptoms and signs involving the nervous system  (R29.898);Unsteadiness on feet (R26.81);Hemiplegia and hemiparesis Hemiplegia - Right/Left: Right Hemiplegia - dominant/non-dominant: Dominant Hemiplegia - caused by: Cerebral infarction                Time: 1206-1300 OT Time Calculation (min): 54 min Charges:  OT General Charges $OT Visit: 1 Visit OT Evaluation $OT Eval Moderate Complexity: 1 Mod OT Treatments $Therapeutic Activity: 23-37 mins $Neuromuscular Re-education: 8-22 mins  09/23/2023  AB, OTR/L  Acute Rehabilitation Services  Office: [phone]   Curtistine JONETTA Das 09/23/2023, 4:47 PM

## 2023-09-23 NOTE — Evaluation (Signed)
 Clinical/Bedside Swallow Evaluation Patient Details  Name: Evan [name] MRN: [id number] Date of Birth: 03/09/1928  Today's Date: 09/23/2023 Time: SLP Start Time (ACUTE ONLY): 1130 SLP Stop Time (ACUTE ONLY): 1150 SLP Time Calculation (min) (ACUTE ONLY): 20 min  Past Medical History:  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   Altered mental status 08/21/2018   Anginal chest pain at rest [facility])    Chronic non, controlled on Lorazepam    Anxiety    Arthritis    all over    BPH (benign prostatic hyperplasia)    CAD (coronary artery disease)    Cellulitis of arm, right 06/01/2018   Cellulitis of left axilla    Chronic lower back pain    Complication of anesthesia    problems making water afterwards   Depression    DJD (degenerative joint disease)    Esophageal ulcer without bleeding    bid ppi indefinitely   Fall 07/16/2018   Family history of adverse reaction to anesthesia    children w/PONV   GERD (gastroesophageal reflux disease)    Haemophilus influenzae septicemia (HCC) 03/12/2023   Headache    couple/week maybe (09/04/2015)   Hemochromatosis    Possible, elevated iron  stores, hook-like osteophytes on hand films, normal LFTs   Hepatitis    yellow jaundice as a baby   History of ASCVD    MULTIVESSEL   Hyperlipidemia    Hypertension    Hypotension    Mild aortic stenosis 06/23/2021   Osteoarthritis    Peripheral vascular disease (HCC)    Pneumonia    several times; got a little now (09/04/2015)   Pulmonary embolism (HCC) 02/02/2018   Rash and nonspecific skin eruption 06/01/2018   Rheumatoid arthritis (HCC)    RSV (respiratory syncytial virus pneumonia) 05/15/2022   Stroke (HCC)    Subdural hematoma (HCC)    small after fall 09/2016-plavix   held, neurosurgery consulted.  Asymptomatic.     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 PO Eliquis    Past Surgical History:  Past Surgical History:  Procedure Laterality Date   ARM SKIN LESION BIOPSY / EXCISION Left 09/02/2015   CATARACT EXTRACTION W/ INTRAOCULAR LENS  IMPLANT, BILATERAL Bilateral    CHOLECYSTECTOMY OPEN     CORNEAL TRANSPLANT Bilateral    one at [facility]; one at [facility]   CORONARY ANGIOPLASTY WITH STENT PLACEMENT     CORONARY ARTERY BYPASS GRAFT  1999   DESCENDING AORTIC ANEURYSM REPAIR W/ STENT     DE stent ostium into the right radial free graft at OM-, 02-2007   ESOPHAGOGASTRODUODENOSCOPY N/A 11/09/2014   Procedure: ESOPHAGOGASTRODUODENOSCOPY (EGD);  Surgeon: Norleen Hint, MD;  Location: [facility] ENDOSCOPY;  Service: Endoscopy;  Laterality: N/A;   INGUINAL HERNIA REPAIR     JOINT REPLACEMENT     LEFT HEART CATH AND CORS/GRAFTS ANGIOGRAPHY N/A 06/27/2017   Procedure: LEFT HEART CATH AND CORS/GRAFTS ANGIOGRAPHY;  Surgeon: Burnard Debby LABOR, MD;  Location: [facility] INVASIVE CV LAB;  Service: Cardiovascular;  Laterality: N/A;   NASAL SINUS SURGERY     SHOULDER OPEN ROTATOR CUFF REPAIR Right    TOTAL KNEE ARTHROPLASTY Bilateral    HPI:  Evan [name] is a 89 y.o. male with medical history significant of dementia, CAD status post CABG, HFpEF, CVA, hypertension, hyperlipidemia, CKD  stage IIIa, COPD, PVD, history of PE on Eliquis , lung cancer, anxiety, BPH with chronic Foley, depression, esophageal ulcer, GERD, hemochromatosis, rheumatoid arthritis, chronic low back pain, chronic anemia and thrombocytopenia.  Recent admission 5/11-5/19 for metapneumovirus pneumonia and heart failure exacerbation.  Admitted again 5/21-5/22 for altered mental status and right upper extremity/hand cellulitis, chest congestion, SOB. Dx with acute hypoxemic respiratory failure secondary to aspiration PNA. Patient with h/o dysphagia, multiple MBS since 2020. Study in 2020 and 2024 recommending  either nectar thick or thin liquids,  study complete 08/2023 recommending regular solids with nectar thick liquids due to aspiration of thin liquids. MBS on 09/11/2023 indicate presbyphagia with esophageal stenosis with a reg/thin diet rec. Pt presents to [facility] on 6/20 from home due to aphasia. MRI demonstrated small areas of acute ischemia within both superior frontal lobes.    Assessment / Plan / Recommendation  Clinical Impression  Pt seen for skilled ST services to determine PO readiness. The pt is currently NPO and was assessed with ice chips and thin liquid. The pt presents with a moderate-severe swallowing impairment. OME had left sided weakness and asymmetry and poor labial seal, which is his baseline from per prior evaluations from past CVAs. The pt was able to follow simple commands approx. 75% of the time given repeated verbal cues but was minimally verbal. The pt's spontaneous verbal output was breathy and intermittently aphonic. The pt consumed ice chips with a wet vocal quality and small sips of thin via straw with oral holding, multiple swallows and a delayed (approx. 8 seconds) weak and strangled cough. PO trials stopped at this time due to increasing s/s of aspiration. Safest recs currently remain NPO with comfort ice chips and. Pt and pt's family heavily educated on the risks of aspiration, plan for instrumental study, and general discussion of therapy outcomes given the severity of his deficits and his long/ variable hx with dysphagia. The pt's family verbally agreeable to undergo objective assessment measures and expressed a desire to avoid seeking nutritional needs via alternative means until a MBS can be completed. SLP to f/u closely with appropriate measures. SLP Visit Diagnosis: Dysphagia, oropharyngeal phase (R13.12)    Aspiration Risk  Severe aspiration risk;Risk for inadequate nutrition/hydration    Diet Recommendation NPO    Medication Administration: Via alternative means     Other  Recommendations Oral Care Recommendations: Oral care QID Caregiver Recommendations: Have oral suction available     Assistance Recommended at Discharge    Functional Status Assessment Patient has had a recent decline in their functional status and/or demonstrates limited ability to make significant improvements in function in a reasonable and predictable amount of time  Frequency and Duration min 3x week  1 week       Prognosis Prognosis for improved oropharyngeal function: Guarded Barriers to Reach Goals: Cognitive deficits;Severity of deficits;Language deficits      Swallow Study   General Date of Onset: 09/22/23 HPI: ELIAKIM TENDLER is a 89 y.o. male with medical history significant of dementia, CAD status post CABG, HFpEF, CVA, hypertension, hyperlipidemia, CKD stage IIIa, COPD, PVD, history of PE on Eliquis , lung cancer, anxiety, BPH with chronic Foley, depression, esophageal ulcer, GERD, hemochromatosis, rheumatoid arthritis, chronic low back pain, chronic anemia and thrombocytopenia.  Recent admission 5/11-5/19 for metapneumovirus pneumonia and heart failure exacerbation.  Admitted again 5/21-5/22 for altered mental status and right upper extremity/hand cellulitis, chest congestion, SOB. Dx with acute hypoxemic respiratory failure secondary to aspiration PNA. Patient with h/o dysphagia, multiple MBS  since 2020. Study in 2020 and 2024 recommending either nectar thick or thin liquids,  study complete 08/2023 recommending regular solids with nectar thick liquids due to aspiration of thin liquids. MBS on 09/11/2023 indicate presbyphagia with esophageal stenosis with a reg/thin diet rec. Pt [facility] to [facility] on 6/20 from home due to aphasia. MRI demonstrated small areas of acute ischemia within both superior frontal lobes. Type of Study: Bedside Swallow Evaluation Previous Swallow Assessment: MBSS 08/2023, 03/10/23, 02/17/22, 10/09/18, 09/11/23 Diet Prior to this Study: NPO Temperature Spikes  Noted: No Respiratory Status: Nasal cannula History of Recent Intubation: No Behavior/Cognition: Confused;Requires cueing;Distractible;Lethargic/Drowsy Oral Cavity Assessment: Within Functional Limits Oral Care Completed by SLP: No Oral Cavity - Dentition: Missing dentition;Poor condition Vision: Impaired for self-feeding Self-Feeding Abilities: Total assist Patient Positioning: Upright in bed Baseline Vocal Quality: Breathy;Aphonic;Low vocal intensity (intermittently aphonic) Volitional Cough: Congested Volitional Swallow: Unable to elicit    Oral/Motor/Sensory Function Overall Oral Motor/Sensory Function: Moderate impairment Facial ROM: Reduced left Facial Symmetry: Abnormal symmetry left Facial Strength: Reduced left Facial Sensation: Within Functional Limits Lingual ROM: Within Functional Limits Lingual Symmetry: Within Functional Limits Lingual Strength: Reduced Lingual Sensation: Within Functional Limits Velum: Within Functional Limits Mandible: Within Functional Limits   Ice Chips Ice chips: Impaired Presentation: Spoon Oral Phase Impairments: Reduced labial seal;Reduced lingual movement/coordination Pharyngeal Phase Impairments: Multiple swallows;Wet Vocal Quality;Decreased hyoid-laryngeal movement   Thin Liquid Thin Liquid: Impaired Presentation: Cup Oral Phase Impairments: Reduced labial seal Oral Phase Functional Implications: Oral holding;Prolonged oral transit Pharyngeal  Phase Impairments: Decreased hyoid-laryngeal movement;Multiple swallows;Wet Vocal Quality;Throat Clearing - Delayed    Nectar Thick Nectar Thick Liquid: Not tested   Honey Thick Honey Thick Liquid: Not tested   Puree Puree: Not tested   Solid     Solid: Not tested     Manuelita Blew M.S. CCC-SLP

## 2023-09-23 NOTE — Progress Notes (Signed)
 PHARMACY - ANTICOAGULATION CONSULT NOTE  Pharmacy Consult for Heparin  Indication: atrial fibrillation  Allergies  Allergen Reactions   Feldene [Piroxicam] Rash    Blistering rash     Neomycin -Polymyxin-Hc Itching   Statins Other (See Comments)    Myalgias   Latex Rash    Band Aid   Tequin [Gatifloxacin] Swelling and Anxiety   Valium [Diazepam] Other (See Comments)    Dizziness    Vibramycin  [Doxycycline ] Itching and Nausea Only    Patient Measurements: Height: 5' 2 (157.5 cm) IBW/kg (Calculated) : 54.6  Vital Signs: Temp: 98.3 F (36.8 C) (06/21 0735) Temp Source: Oral (06/21 0547) BP: 108/63 (06/21 0735) Pulse Rate: 81 (06/21 0735)  Labs: Recent Labs    09/22/23 1957 09/22/23 1959  HGB 8.8* 9.2*  HCT 27.3* 27.0*  PLT 100*  --   APTT 42*  --   LABPROT 17.3*  --   INR 1.4*  --   CREATININE 1.02 0.90    Estimated Creatinine Clearance: 38.8 mL/min (by C-G formula based on SCr of 0.9 mg/dL).   Medical History: 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   Altered mental status 08/21/2018   Anginal chest pain at rest [facility])    Chronic non, controlled on Lorazepam    Anxiety    Arthritis    all over    BPH (benign prostatic hyperplasia)    CAD (coronary artery disease)    Cellulitis of arm, right 06/01/2018   Cellulitis of left axilla    Chronic lower back pain    Complication of anesthesia    problems making water afterwards   Depression    DJD (degenerative joint disease)    Esophageal ulcer without bleeding    bid ppi indefinitely   Fall 07/16/2018   Family history of adverse reaction to anesthesia    children w/PONV   GERD (gastroesophageal reflux disease)    Haemophilus influenzae septicemia (HCC) 03/12/2023   Headache     couple/week maybe (09/04/2015)   Hemochromatosis    Possible, elevated iron  stores, hook-like osteophytes on hand films, normal LFTs   Hepatitis    yellow jaundice as a baby   History of ASCVD    MULTIVESSEL   Hyperlipidemia    Hypertension    Hypotension    Mild aortic stenosis 06/23/2021   Osteoarthritis    Peripheral vascular disease (HCC)    Pneumonia    several times; got a little now (09/04/2015)   Pulmonary embolism (HCC) 02/02/2018   Rash and nonspecific skin eruption 06/01/2018   Rheumatoid arthritis (HCC)    RSV (respiratory syncytial virus pneumonia) 05/15/2022   Stroke (HCC)    Subdural hematoma (HCC)    small after fall 09/2016-plavix  held, neurosurgery consulted.  Asymptomatic.     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 PO Eliquis     Medications:  Medications Prior to Admission  Medication Sig Dispense Refill Last Dose/Taking   acetaminophen  (TYLENOL ) 325 MG tablet Take 325-650 mg by mouth 2 (two) times daily as needed for mild pain (pain score 1-3), moderate pain (pain score 4-6), fever or headache.   Past Week   apixaban  (ELIQUIS ) 2.5 MG TABS tablet Take 1 tablet (2.5 mg total) by mouth 2 (two) times daily. 180 tablet 3 09/22/2023 at 10:00 AM  budesonide -formoterol  (SYMBICORT ) 160-4.5 MCG/ACT inhaler Inhale 2 puffs into the lungs in the morning and at bedtime. 10.2 g 12 09/22/2023 Evening   ferrous sulfate  (FEROSUL) 325 (65 FE) MG tablet TAKE 1 TABLET EVERY DAY 90 tablet 0 09/22/2023 Morning   FLUoxetine  (PROZAC ) 20 MG capsule TAKE 1 CAPSULE EVERY DAY 90 capsule 3 09/22/2023 Morning   folic acid  (FOLVITE ) 1 MG tablet Take 1 tablet (1 mg total) by mouth daily. 30 tablet 0 09/22/2023 Morning   furosemide  (LASIX ) 40 MG tablet TAKE 1 TABLET EVERY DAY (Patient taking differently: Take 40 mg by mouth every evening.) 90 tablet 3 09/22/2023 Evening   melatonin 3 MG TABS tablet Take 1 tablet (3 mg total) by mouth at bedtime as needed (insomnia).  (Patient taking differently: Take 3 mg by mouth at bedtime.) 30 tablet 0 09/21/2023 Bedtime   metoprolol  tartrate (LOPRESSOR ) 25 MG tablet Take 25 mg by mouth daily.   09/22/2023 Morning   Multiple Vitamins-Minerals (MENS 50+ MULTIVITAMIN) TABS Take 1 tablet by mouth daily.   09/22/2023 Morning   nystatin  (MYCOSTATIN /NYSTOP ) powder Apply a thin layer topically to the affected area 2-3 (two to three) times daily. 15 g 0 09/22/2023 Evening   pantoprazole  (PROTONIX ) 40 MG tablet TAKE 1 TABLET EVERY MORNING 90 tablet 3 09/22/2023 Morning   rosuvastatin  (CRESTOR ) 5 MG tablet Take 1 tablet (5 mg total) by mouth at bedtime. (Patient taking differently: Take 5 mg by mouth every evening.)   09/22/2023 Evening   senna-docusate (STIMULANT LAXATIVE) 8.6-50 MG tablet Take 2 tablets by mouth 2 (two) times daily as needed for constipation 60 tablet 2 Past Week   tamsulosin  (FLOMAX ) 0.4 MG CAPS capsule Take 1 capsule (0.4 mg total) by mouth daily with supper. (Patient taking differently: Take 0.4 mg by mouth daily after supper.) 90 capsule 3 09/21/2023 Evening   Zinc  Oxide (TRIPLE PASTE) 12.8 % ointment Apply topically 4 (four) times daily as needed (skin barrier). (Patient taking differently: Apply 1 Application topically 2 (two) times daily as needed (skin barrier).) 56.7 g 0 Past Week   hydrOXYzine  (ATARAX ) 10 MG/5ML syrup Take 5 mLs (10 mg total) by mouth every 8 (eight) hours as needed for itching. (Patient not taking: Reported on 09/22/2023) 30 mL 0 Not Taking   mupirocin  ointment (BACTROBAN ) 2 % Apply 1 Application topically 2 (two) times daily. 22 g 0 Unknown   nitroGLYCERIN  (NITROSTAT ) 0.4 MG SL tablet Place 1 tablet (0.4 mg total) under the tongue every 5 (five) minutes as needed for chest pain. (Patient not taking: Reported on 09/09/2023) 50 tablet 3 Not Taking    Assessment: 89 yo male with h/o afib on apixaban  2.5mg  BID. Last dose 09/22/23 at 1000. Patient now NPO and, therefore, unable to resume apixaban  at this  time. MD wishes to place patient on heparin  drip (no bolus)  for stroke prophylaxis.   Will use APTT initially due to prior DOAC until Heparin  levels and APTT correlate.   Goal of Therapy:  aPTT 66-85 seconds Heparin  level 0.3-0.5 Monitor platelets by anticoagulation protocol: Yes   Plan:  Start heparin  drip at 750 units/hr, no boluses APTT/HL in 8 hours  Daily heparin  level, APTT (until HL and APTT correlate)  Evan [name] A. Lyle, PharmD, BCPS, FNKF Clinical Pharmacist [place] Please utilize Amion for appropriate phone number to reach the unit pharmacist [facility])  09/23/2023,1:40 PM

## 2023-09-23 NOTE — Plan of Care (Signed)
  Problem: Clinical Measurements: Goal: Ability to maintain clinical measurements within normal limits will improve Outcome: Progressing Goal: Will remain free from infection Outcome: Progressing Goal: Diagnostic test results will improve Outcome: Progressing Goal: Respiratory complications will improve Outcome: Progressing Goal: Cardiovascular complication will be avoided Outcome: Progressing   Problem: Elimination: Goal: Will not experience complications related to bowel motility Outcome: Progressing Goal: Will not experience complications related to urinary retention Outcome: Progressing   Problem: Safety: Goal: Ability to remain free from injury will improve Outcome: Progressing   Problem: Pain Managment: Goal: General experience of comfort will improve and/or be controlled Outcome: Progressing   Problem: Skin Integrity: Goal: Risk for impaired skin integrity will decrease Outcome: Progressing

## 2023-09-23 NOTE — Progress Notes (Signed)
 PHARMACY - ANTICOAGULATION CONSULT NOTE  Pharmacy Consult for Heparin  Indication: atrial fibrillation  Allergies  Allergen Reactions   Feldene [Piroxicam] Rash    Blistering rash     Neomycin -Polymyxin-Hc Itching   Statins Other (See Comments)    Myalgias   Latex Rash    Band Aid   Tequin [Gatifloxacin] Swelling and Anxiety   Valium [Diazepam] Other (See Comments)    Dizziness    Vibramycin  [Doxycycline ] Itching and Nausea Only    Patient Measurements: Height: 5' 2 (157.5 cm) Weight: 58.5 kg (128 lb 15.5 oz) (09/09/2023) IBW/kg (Calculated) : 54.6 HEPARIN  DW (KG): 58.5  Vital Signs: Temp: 98.5 F (36.9 C) (06/21 1940) Temp Source: Oral (06/21 1940) BP: 132/75 (06/21 1940) Pulse Rate: 92 (06/21 1940)  Labs: Recent Labs    09/22/23 1957 09/22/23 1959 09/23/23 2217  HGB 8.8* 9.2*  --   HCT 27.3* 27.0*  --   PLT 100*  --   --   APTT 42*  --  63*  LABPROT 17.3*  --   --   INR 1.4*  --   --   HEPARINUNFRC  --   --  >1.10*  CREATININE 1.02 0.90  --     Estimated Creatinine Clearance: 38.8 mL/min (by C-G formula based on SCr of 0.9 mg/dL).  Assessment: 89 yo male with h/o afib on apixaban  2.5mg  BID. Last dose 09/22/23 at 1000. Patient now NPO and, therefore, unable to resume apixaban  at this time. MD wishes to place patient on heparin  drip (no bolus)  for stroke prophylaxis.   Will use APTT initially due to prior DOAC until Heparin  levels and APTT correlate.   PM: aPTT slightly below goal and heparin  level falsely elevated given DOAC use. Per RN, no signs/symptoms of bleeding or issues with the heparin  infusion running continuously.  Goal of Therapy:  aPTT 66-85 seconds Heparin  level 0.3-0.5 Monitor platelets by anticoagulation protocol: Yes   Plan:  Increase heparin  drip at 850 units/hr, no boluses APTT in 8 hours  Daily heparin  level, APTT (until heparin  level and APTT correlate)  Lynwood Poplar, PharmD, BCPS Clinical Pharmacist 09/23/2023 11:00 PM

## 2023-09-23 NOTE — Progress Notes (Addendum)
 STROKE TEAM PROGRESS NOTE   SUBJECTIVE (INTERVAL HISTORY) His son and daughter are at the bedside.  Pt lying in bed, eyes open, however hard of hearing, able to follow commands, still has severe dysarthria and right hemiplegia.   OBJECTIVE Temp:  [97.7 F (36.5 C)-98.3 F (36.8 C)] 98.3 F (36.8 C) (06/21 0735) Pulse Rate:  [75-82] 81 (06/21 0735) Cardiac Rhythm: Atrial fibrillation (06/21 0700) Resp:  [16-33] 19 (06/21 0735) BP: (108-133)/(58-78) 108/63 (06/21 0735) SpO2:  [93 %-97 %] 94 % (06/21 0735)  Recent Labs  Lab 09/22/23 1944  GLUCAP 132*   Recent Labs  Lab 09/22/23 1957 09/22/23 1959 09/22/23 2152  NA 136 137  --   K 3.1* 3.0*  --   CL 106 103  --   CO2 20*  --   --   GLUCOSE 118* 124*  --   BUN 21 20  --   CREATININE 1.02 0.90  --   CALCIUM  8.6*  --   --   MG  --   --  1.7   Recent Labs  Lab 09/22/23 1957  AST 22  ALT 13  ALKPHOS 86  BILITOT 0.4  PROT 6.8  ALBUMIN 2.7*   Recent Labs  Lab 09/22/23 1957 09/22/23 1959  WBC 6.1  --   NEUTROABS 4.1  --   HGB 8.8* 9.2*  HCT 27.3* 27.0*  MCV 105.4*  --   PLT 100*  --    No results for input(s): CKTOTAL, CKMB, CKMBINDEX, TROPONINI in the last 168 hours. Recent Labs    09/22/23 1957  LABPROT 17.3*  INR 1.4*   Recent Labs    09/22/23 2048  COLORURINE STRAW*  LABSPEC 1.015  PHURINE 7.0  GLUCOSEU NEGATIVE  HGBUR NEGATIVE  BILIRUBINUR NEGATIVE  KETONESUR NEGATIVE  PROTEINUR NEGATIVE  NITRITE NEGATIVE  LEUKOCYTESUR NEGATIVE       Component Value Date/Time   CHOL 70 09/23/2023 0447   CHOL 91 (L) 06/22/2020 0953   CHOL 97 09/09/2013 0957   TRIG 49 09/23/2023 0447   TRIG 92 09/09/2013 0957   HDL 29 (L) 09/23/2023 0447   HDL 35 (L) 06/22/2020 0953   HDL 37 (L) 09/09/2013 0957   CHOLHDL 2.4 09/23/2023 0447   VLDL 10 09/23/2023 0447   LDLCALC 31 09/23/2023 0447   LDLCALC 41 06/22/2020 0953   LDLCALC 42 09/09/2013 0957   Lab Results  Component Value Date   HGBA1C 5.7 (H)  09/22/2023      Component Value Date/Time   LABOPIA NONE DETECTED 09/22/2023 2040   COCAINSCRNUR NONE DETECTED 09/22/2023 2040   LABBENZ NONE DETECTED 09/22/2023 2040   AMPHETMU NONE DETECTED 09/22/2023 2040   THCU NONE DETECTED 09/22/2023 2040   LABBARB NONE DETECTED 09/22/2023 2040    Recent Labs  Lab 09/22/23 1950  ETH <15    I have personally reviewed the radiological images below and agree with the radiology interpretations.  EEG adult Result Date: 09/23/2023 Shelton Evan KIDD, MD     09/23/2023  6:31 AM Patient Name: Evan [name] MRN: [id number] Epilepsy Attending: Arlin [name] Shelton Referring Physician/Provider: Jerrie Lola CROME, MD Date: 09/22/2023 Duration: 21.20 mins Patient history:  89 y.o. male with a past medical history of multiple vascular risk factors as documented above, presenting with acute onset aphasia. EEG to evaluate for seizure. Level of alertness: Awake AEDs during EEG study: None Technical aspects: This EEG study was done with scalp electrodes positioned according to the 10-20 International system of electrode placement.  Electrical activity was reviewed with band pass filter of 1-70Hz , sensitivity of 7 uV/mm, display speed of 33mm/sec with a 60Hz  notched filter applied as appropriate. EEG data were recorded continuously and digitally stored.  Video monitoring was available and reviewed as appropriate. Description: EEG showed continuous generalized 3 to 6 Hz theta-delta slowing. Hyperventilation and photic stimulation were not performed.   ABNORMALITY - Continuous slow, generalized IMPRESSION: This study is suggestive of moderate diffuse encephalopathy. No seizures or epileptiform discharges were seen throughout the recording. Evan MALVA Krebs   MR BRAIN WO CONTRAST Result Date: 09/23/2023 CLINICAL DATA:  Delirium EXAM: MRI HEAD WITHOUT CONTRAST TECHNIQUE: Multiplanar, multiecho pulse sequences of the brain and surrounding structures were obtained without intravenous  contrast. COMPARISON:  None Available. FINDINGS: Truncated and motion degraded examination due to patient altered mental status and inability to remain still. Only diffusion-weighted imaging and axial T2-weighted FLAIR sequence were performed. There are small areas of acute ischemia within both superior frontal lobes, affecting the right MCA territory and left ACA territory. Probable punctate focus of acute ischemia in the left occipital lobe. There is multifocal hyperintense T2-weighted signal within the periventricular and deep white matter. IMPRESSION: 1. Truncated and motion degraded examination due to patient altered mental status. 2. Small areas of acute ischemia within both superior frontal lobes, affecting the right MCA territory and left ACA territory. 3. Probable punctate focus of acute ischemia in the left occipital lobe. Electronically Signed   By: Franky Stanford M.D.   On: 09/23/2023 03:40   CT ANGIO HEAD NECK W WO CM (CODE STROKE) Result Date: 09/22/2023 CLINICAL DATA:  Aphasia EXAM: CT ANGIOGRAPHY HEAD AND NECK WITH AND WITHOUT CONTRAST TECHNIQUE: Multidetector CT imaging of the head and neck was performed using the standard protocol during bolus administration of intravenous contrast. Multiplanar CT image reconstructions and MIPs were obtained to evaluate the vascular anatomy. Carotid stenosis measurements (when applicable) are obtained utilizing NASCET criteria, using the distal internal carotid diameter as the denominator. RADIATION DOSE REDUCTION: This exam was performed according to the departmental dose-optimization program which includes automated exposure control, adjustment of the mA and/or kV according to patient size and/or use of iterative reconstruction technique. CONTRAST:  75mL OMNIPAQUE  IOHEXOL  350 MG/ML SOLN COMPARISON:  07/14/2018 FINDINGS: CTA NECK FINDINGS Skeleton: No acute abnormality or high grade bony spinal canal stenosis. Other neck: Normal pharynx, larynx and major  salivary glands. No cervical lymphadenopathy. Unremarkable thyroid  gland. Upper chest: Emphysema and unchanged right paratracheal adenopathy. Aortic arch: There is calcific atherosclerosis of the aortic arch. Conventional 3 vessel aortic branching pattern. RIGHT carotid system: No dissection, occlusion or aneurysm. There is mixed density atherosclerosis extending into the proximal ICA, resulting in 50% stenosis. LEFT carotid system: Bulky, circumferential, mixed density atherosclerotic disease at the left carotid bifurcation extending into the proximal external and internal carotid arteries. There is 60% proximal left ICA stenosis. Vertebral arteries: Left dominant configuration. There is no dissection, occlusion or flow-limiting stenosis to the skull base (V1-V3 segments). CTA HEAD FINDINGS POSTERIOR CIRCULATION: Mild atherosclerosis of the left vertebral artery V4 segment. Right vertebral artery is diminutive but patent. No proximal occlusion of the anterior or inferior cerebellar arteries. Basilar artery is normal. Superior cerebellar arteries are normal. Posterior cerebral arteries are normal. ANTERIOR CIRCULATION: Atherosclerotic calcification of the internal carotid arteries at the skull base without hemodynamically significant stenosis. Azygous anterior cerebral artery. Middle cerebral arteries are normal. Venous sinuses: As permitted by contrast timing, patent. Anatomic variants: Fetal origins of both posterior cerebral arteries.  Review of the MIP images confirms the above findings. IMPRESSION: 1. No emergent large vessel occlusion. 2. Bilateral carotid bifurcation atherosclerosis with 50% stenosis of the proximal right ICA and 60% stenosis of the proximal left ICA. Aortic Atherosclerosis (ICD10-I70.0) and Emphysema (ICD10-J43.9). Electronically Signed   By: Franky Stanford M.D.   On: 09/22/2023 20:36   CT HEAD CODE STROKE WO CONTRAST Result Date: 09/22/2023 CLINICAL DATA:  Code stroke.  Acute neurologic  deficit EXAM: CT HEAD WITHOUT CONTRAST TECHNIQUE: Contiguous axial images were obtained from the base of the skull through the vertex without intravenous contrast. RADIATION DOSE REDUCTION: This exam was performed according to the departmental dose-optimization program which includes automated exposure control, adjustment of the mA and/or kV according to patient size and/or use of iterative reconstruction technique. COMPARISON:  04/28/2022 FINDINGS: Brain: There is no mass, hemorrhage or extra-axial collection. There is generalized atrophy without lobar predilection. There is hypoattenuation of the periventricular white matter, most commonly indicating chronic ischemic microangiopathy. Vascular: Atherosclerotic calcification of the vertebral and internal carotid arteries at the skull base. No abnormal hyperdensity of the major intracranial arteries or dural venous sinuses. Skull: The visualized skull base, calvarium and extracranial soft tissues are normal. Sinuses/Orbits: Mild ethmoid sinus mucosal thickening. The orbits are normal. ASPECTS [facility] Stroke Program Early CT Score) - Ganglionic level infarction (caudate, lentiform nuclei, internal capsule, insula, M1-M3 cortex): 7 - Supraganglionic infarction (M4-M6 cortex): 3 Total score (0-10 with 10 being normal): 10 IMPRESSION: 1. No acute intracranial abnormality. 2. ASPECTS is 10. These results were communicated to Dr. Lola Jernigan at 8:04 pm on 09/22/2023 by text page via the [facility] messaging system. Electronically Signed   By: Franky Stanford M.D.   On: 09/22/2023 20:05   DG CHEST PORT 1 VIEW Result Date: 09/13/2023 CLINICAL DATA:  Sudden onset shortness of breath EXAM: PORTABLE CHEST 1 VIEW COMPARISON:  09/10/2023 FINDINGS: Cardiac shadow is prominent but stable. Postsurgical changes are noted. Aortic calcifications are seen. Slight increase in the degree of interstitial changes are noted consistent with worsening edema. No focal confluent infiltrate or  effusion is noted. IMPRESSION: Worsening edema. Electronically Signed   By: Oneil Devonshire M.D.   On: 09/13/2023 00:26   DG Swallowing Func-Speech Pathology Result Date: 09/10/2023 Table formatting from the original result was not included. Modified Barium Swallow Study Patient Details Name: JAMAAL BERNASCONI MRN: [id number] Date of Birth: [birth year] Today's Date: 09/10/2023 HPI/PMH: HPI: Evan [name] is a 89 y.o. male with medical history significant of dementia, CAD status post CABG, HFpEF, CVA, hypertension, hyperlipidemia, CKD stage IIIa, COPD, PVD, history of PE on Eliquis , lung cancer, anxiety, BPH with chronic Foley, depression, esophageal ulcer, GERD, hemochromatosis, rheumatoid arthritis, chronic low back pain, chronic anemia and thrombocytopenia.  Recent admission 5/11-5/19 for metapneumovirus pneumonia and heart failure exacerbation.  Admitted again 5/21-5/22 for altered mental status and right upper extremity/hand cellulitis, chest congestion, SOB. Dx with acute hypoxemic respiratory failure secondary to aspiration PNA. Patient with h/o dysphagia, multiple MBS since 2020. Study in 2020 and 2024 recommending either nectar thick or thin liquids,  study complete 08/2023 recommending regular solids with nectar thick liquids due to aspiration of thin liquids. Clinical Impression: Clinical Impression: Patient presents with an age appropriate, functional oropharyngeal swallow based on today's assessment. Oral phase appropriate with timely mastication and full oral clearance. Swallow initiated at the level of the vallecula with all boluses with the acception of thin liquids which were intermittently initiated at the pyriform sinuses resulting in flash penetration  of thin liquids which is considered normal for age. No aspiration observed, even when challenged with large consecutive boluses via straw. Most significant finding was notable stasis througout esophagus, only post intake of previously noted large serial swallows  of thin liquid. Liquid did not clear independently over approximately 60 seconds. Given bite of pureed solid which quickly pushed entire contents of esophagus into the stomach. Subseqent single small sips of thin liquid passed quickly. 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 Defer further w/u to MD. Education complete with son who was present for the study. Suspect that this patient, with advanced age, h/o CVA, and fluctuating dysphagia documented for at least 5 years, will continue to have fluctuating swallowing abilities with chronic risk of aspiration, exacerbated by suspected esophageal deficits. Recommending thin liquids at this time. SLP will f/u to reinforce use of precautions. Factors that may increase risk of adverse event in presence of aspiration Noe & Lianne 2021): Factors that may increase risk of adverse event in presence of aspiration Noe & Lianne 2021): Respiratory or GI disease; Limited mobility Recommendations/Plan: Swallowing Evaluation Recommendations Swallowing Evaluation Recommendations Recommendations: PO diet PO Diet Recommendation: Regular; Thin liquids (Level 0) Liquid Administration via: Cup; Straw Medication Administration: Whole meds with puree Supervision: Patient able to self-feed; Full supervision/cueing for swallowing strategies Swallowing strategies  : Slow rate; Small bites/sips (single sips) Postural changes: Stay upright 30-60 min after meals; Position pt fully upright for meals Oral care recommendations: Oral care BID (2x/day) Recommended consults: Consider esophageal assessment Treatment Plan Treatment Plan Treatment recommendations: Therapy as outlined in treatment plan below Follow-up recommendations: Home health SLP Functional status assessment: Patient has had a recent decline in their functional status and demonstrates the ability to make significant improvements in function in a reasonable and predictable amount of time. Treatment frequency: Min 1x/week  Treatment duration: 1 week Interventions: Aspiration precaution training; Compensatory techniques; Patient/family education; Diet toleration management by SLP Recommendations Recommendations for follow up therapy are one component of a multi-disciplinary discharge planning process, led by the attending physician.  Recommendations may be updated based on patient status, additional functional criteria and insurance authorization. Assessment: Orofacial Exam: Orofacial Exam Oral Cavity: Oral Hygiene: WFL Oral Cavity - Dentition: Adequate natural dentition Orofacial Anatomy: WFL Oral Motor/Sensory Function: Suspected cranial nerve impairment CN VII - Facial: Right motor impairment (baseline) Anatomy: Anatomy: Other (Comment) (what appears to be calcification along posterior pharyhngeal wall, above UES again noted, unchanged from previous studies) Boluses Administered: Boluses Administered Boluses Administered: Thin liquids (Level 0); Mildly thick liquids (Level 2, nectar thick); Puree; Solid  Oral Impairment Domain: Oral Impairment Domain Lip Closure: Escape beyond mid-chin Tongue control during bolus hold: Cohesive bolus between tongue to palatal seal Bolus preparation/mastication: Slow prolonged chewing/mashing with complete recollection Bolus transport/lingual motion: Brisk tongue motion Initiation of pharyngeal swallow : Pyriform sinuses  Pharyngeal Impairment Domain: Pharyngeal Impairment Domain Soft palate elevation: No bolus between soft palate (SP)/pharyngeal wall (PW) Laryngeal elevation: Complete superior movement of thyroid  cartilage with complete approximation of arytenoids to epiglottic petiole Anterior hyoid excursion: Complete anterior movement Epiglottic movement: Complete inversion Laryngeal vestibule closure: Incomplete, narrow column air/contrast in laryngeal vestibule Pharyngeal stripping wave : Present - complete Pharyngeal contraction (A/P view only): N/A Pharyngoesophageal segment opening: Complete  distension and complete duration, no obstruction of flow Tongue base retraction: No contrast between tongue base and posterior pharyngeal wall (PPW) Pharyngeal residue: Complete pharyngeal clearance  Esophageal Impairment Domain: Esophageal Impairment Domain Esophageal clearance upright position: Esophageal retention Pill: Pill Consistency administered: Mildly thick  liquids (Level 2, nectar thick) Mildly thick liquids (Level 2, nectar thick): WFL Penetration/Aspiration Scale Score: Penetration/Aspiration Scale Score 1.  Material does not enter airway: Mildly thick liquids (Level 2, nectar thick); Puree; Solid; Pill 2.  Material enters airway, remains ABOVE vocal cords then ejected out: Thin liquids (Level 0) Compensatory Strategies: No data recorded  General Information: Caregiver present: Yes  Diet Prior to this Study: NPO   Temperature : Normal   Respiratory Status: WFL   Supplemental O2: Nasal cannula   History of Recent Intubation: No  Behavior/Cognition: Alert; Cooperative; Pleasant mood; Requires cueing Self-Feeding Abilities: Able to self-feed Baseline vocal quality/speech: Dysphonic Volitional Cough: Able to elicit Volitional Swallow: Able to elicit No data recorded Goal Planning: Prognosis for improved oropharyngeal function: Good No data recorded No data recorded No data recorded Consulted and agree with results and recommendations: Patient; Family member/caregiver Pain: Pain Assessment Pain Assessment: No/denies pain End of Session: Start Time:SLP Start Time (ACUTE ONLY): 0935 Stop Time: SLP Stop Time (ACUTE ONLY): 0955 Time Calculation:SLP Time Calculation (min) (ACUTE ONLY): 20 min Charges: SLP Evaluations $ SLP Speech Visit: 1 Visit SLP Evaluations $BSS Swallow: 1 Procedure $MBS Swallow: 1 Procedure SLP visit diagnosis: SLP Visit Diagnosis: Dysphagia, oropharyngeal phase (R13.12) Past Medical History: Past Medical History: Diagnosis Date  Acute blood loss anemia   Acute bronchitis 05/15/2017  Acute  spontaneous 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 (HCC) L thalamic 07/14/2018  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  Altered mental status 08/21/2018  Anginal chest pain at rest [facility])   Chronic non, controlled on Lorazepam   Anxiety   Arthritis   all over   BPH (benign prostatic hyperplasia)   CAD (coronary artery disease)   Cellulitis of arm, right 06/01/2018  Cellulitis of left axilla   Chronic lower back pain   Complication of anesthesia   problems making water afterwards  Depression   DJD (degenerative joint disease)   Esophageal ulcer without bleeding   bid ppi indefinitely  Fall 07/16/2018  Family history of adverse reaction to anesthesia   children w/PONV  GERD (gastroesophageal reflux disease)   Haemophilus influenzae septicemia (HCC) 03/12/2023  Headache   couple/week maybe (09/04/2015)  Hemochromatosis   Possible, elevated iron  stores, hook-like osteophytes on hand films, normal LFTs  Hepatitis   yellow jaundice as a baby  History of ASCVD   MULTIVESSEL  Hyperlipidemia   Hypertension   Hypotension   Mild aortic stenosis 06/23/2021  Osteoarthritis   Peripheral vascular disease (HCC)   Pneumonia   several times; got a little now (09/04/2015)  Pulmonary embolism (HCC) 02/02/2018  Rash and nonspecific skin eruption 06/01/2018  Rheumatoid arthritis (HCC)   RSV (respiratory syncytial virus pneumonia) 05/15/2022  Subdural hematoma (HCC)   small after fall 09/2016-plavix  held, neurosurgery consulted.  Asymptomatic.    Thromboembolism (HCC) 02/2018  on Lovenox  lifelong since he failed PO Eliquis  Past Surgical History: Past Surgical History: Procedure Laterality Date  ARM SKIN LESION BIOPSY / EXCISION Left 09/02/2015  CATARACT EXTRACTION W/ INTRAOCULAR LENS  IMPLANT, BILATERAL Bilateral   CHOLECYSTECTOMY OPEN    CORNEAL TRANSPLANT Bilateral   one at [facility]; one at [facility]  CORONARY ANGIOPLASTY WITH STENT PLACEMENT    CORONARY ARTERY BYPASS GRAFT  1999  DESCENDING AORTIC  ANEURYSM REPAIR W/ STENT    DE stent ostium into the right radial free graft at OM-, 02-2007  ESOPHAGOGASTRODUODENOSCOPY N/A 11/09/2014  Procedure: ESOPHAGOGASTRODUODENOSCOPY (EGD);  Surgeon: Norleen Hint, MD;  Location[facility] ENDOSCOPY;  Service: Endoscopy;  Laterality: N/A;  INGUINAL HERNIA REPAIR    JOINT REPLACEMENT    LEFT HEART CATH AND CORS/GRAFTS ANGIOGRAPHY N/A 06/27/2017  Procedure: LEFT HEART CATH AND CORS/GRAFTS ANGIOGRAPHY;  Surgeon: Burnard Debby LABOR, MD;  Location: [facility] INVASIVE CV LAB;  Service: Cardiovascular;  Laterality: N/A;  NASAL SINUS SURGERY    SHOULDER OPEN ROTATOR CUFF REPAIR Right   TOTAL KNEE ARTHROPLASTY Bilateral  Rea Pass MA, CCC-SLP s Pass Rea Meryl 09/10/2023, 10:32 AM  DG Chest 1 View Result Date: 09/10/2023 CLINICAL DATA:  Shortness of breath. EXAM: CHEST  1 VIEW COMPARISON:  Chest radiograph dated 08/23/2023. FINDINGS: There is diffuse chronic intra coarsening. Right lung base atelectasis/scarring. Pneumonia is not excluded. No large pleural effusion. No pneumothorax. Stable cardiac silhouette. Median sternotomy wires. No acute osseous pathology. IMPRESSION: Right lung base atelectasis/scarring. Pneumonia is not excluded. Electronically Signed   By: Vanetta Chou M.D.   On: 09/10/2023 09:44   CT Chest Wo Contrast Result Date: 09/09/2023 CLINICAL DATA:  Pneumonia, complication suspected, xray done outpt xray - concern for PNA EXAM: CT CHEST WITHOUT CONTRAST TECHNIQUE: Multidetector CT imaging of the chest was performed following the standard protocol without IV contrast. RADIATION DOSE REDUCTION: This exam was performed according to the departmental dose-optimization program which includes automated exposure control, adjustment of the mA and/or kV according to patient size and/or use of iterative reconstruction technique. COMPARISON:  08/23/2023, 03/13/2023 FINDINGS: Cardiovascular: Unenhanced imaging of the heart demonstrates cardiomegaly without pericardial effusion. Prominent left  atrial dilatation. Postsurgical changes from prior CABG. Normal caliber of the thoracic aorta. Atherosclerosis of the aorta and coronary vasculature. Assessment of the vascular lumen cannot be performed without intravenous contrast. Mediastinum/Nodes: Borderline enlarged mediastinal adenopathy, measuring up to 10 mm in the right paratracheal station. Thyroid , trachea, and esophagus are unremarkable. Lungs/Pleura: Diffuse subpleural interlobular septal thickening consistent with chronic scarring. There is superimposed dense airspace disease within the dependent right lower lobe consistent with aspiration or infection. Trace right pleural effusion. No pneumothorax. Central airways are patent. Upper Abdomen: No acute abnormality. Musculoskeletal: No acute or destructive bony abnormalities. Reconstructed images demonstrate no additional findings. IMPRESSION: 1. Dense right basilar consolidation consistent with infection or aspiration. 2. Trace right pleural effusion. 3. Basilar predominant subpleural scarring and fibrosis. 4.  Aortic Atherosclerosis (ICD10-I70.0). 5. Cardiomegaly, with diffuse coronary artery atherosclerosis. Electronically Signed   By: Ozell Daring M.D.   On: 09/09/2023 19:53   VAS US  UPPER EXTREMITY VENOUS DUPLEX Result Date: 08/25/2023 UPPER VENOUS STUDY  Patient Name:  Evan [name]  Date of Exam:   08/24/2023 Medical Rec #: [id number]     Accession #:    [id number] Date of Birth: 19-Jul-1927     Patient Gender: M Patient Age:   70 years Exam Location[facility]  [facility] Procedure:      VAS US  UPPER EXTREMITY VENOUS DUPLEX Referring Phys: DORN DAWSON --------------------------------------------------------------------------------  Indications: Edema Risk Factors: PICC line. Limitations: Poor ultrasound/tissue interface and patient positioning, patient movement. Comparison Study: No prior studies. Performing Technologist: Cordella Collet RVT  Examination Guidelines: A complete evaluation  includes B-mode imaging, spectral Doppler, color Doppler, and power Doppler as needed of all accessible portions of each vessel. Bilateral testing is considered an integral part of a complete examination. Limited examinations for reoccurring indications may be performed as noted.  Right Findings: +----------+------------+---------+-----------+----------+-------+ RIGHT     CompressiblePhasicitySpontaneousPropertiesSummary +----------+------------+---------+-----------+----------+-------+ IJV           Full       Yes       Yes                      +----------+------------+---------+-----------+----------+-------+  Subclavian               Yes       Yes                      +----------+------------+---------+-----------+----------+-------+ Axillary      Full       Yes       Yes                      +----------+------------+---------+-----------+----------+-------+ Brachial      Full                                          +----------+------------+---------+-----------+----------+-------+ Radial        Full                                          +----------+------------+---------+-----------+----------+-------+ Ulnar         Full                                          +----------+------------+---------+-----------+----------+-------+ Cephalic      Full                                          +----------+------------+---------+-----------+----------+-------+ Basilic       Full                                          +----------+------------+---------+-----------+----------+-------+  Left Findings: +----------+------------+---------+-----------+----------+-------+ LEFT      CompressiblePhasicitySpontaneousPropertiesSummary +----------+------------+---------+-----------+----------+-------+ Subclavian               Yes       Yes                      +----------+------------+---------+-----------+----------+-------+  Summary:  Right: No  evidence of deep vein thrombosis in the upper extremity. No evidence of superficial vein thrombosis in the upper extremity.  Left: No evidence of thrombosis in the subclavian.  *See table(s) above for measurements and observations.  Diagnosing physician: Norman Serve Electronically signed by Norman Serve on 08/25/2023 at 8:17:59 AM.    Final      PHYSICAL EXAM  Temp:  [97.7 F (36.5 C)-98.3 F (36.8 C)] 98.3 F (36.8 C) (06/21 0735) Pulse Rate:  [75-82] 81 (06/21 0735) Resp:  [16-33] 19 (06/21 0735) BP: (108-133)/(58-78) 108/63 (06/21 0735) SpO2:  [93 %-97 %] 94 % (06/21 0735)  General - thin built with malnourished, well developed, in no apparent distress.  Ophthalmologic - fundi not visualized due to noncooperation.  Cardiovascular - irregularly irregular heart rate and rhythm.  Neuro - awake, alert, eyes open, hard of hearing, with family assistant, patient was able to orientated to age and place, but not to time.  Severe dysarthria, hard to comprehend with intermittent intangible words.  No gaze palsy, tracking bilaterally, not consistently blinking to visual threat bilaterally. No significant facial droop. Tongue midline. LUE against gravity and without drift, RUE  2+/5 with drift to bed within 10 seconds. LLE 3/5 against gravity,  RLE 1/5 without movement.  Sensation symmetrical bilaterally subjectively, left FTN intact but very slow in action, gait not tested.     ASSESSMENT/PLAN Mr. KENICHI CASSADA is a 89 y.o. male with history of dementia, CAD status post CABG, diastolic CHF, hypertension, hyperlipidemia, PAF and PE on Eliquis , PUD, lung cancer in remission, BPH with chronic Foley, rheumatoid arthritis, anemia and thrombocytopenia as well as previous ICH in 07/2018 admitted for worsening right-sided weakness and aphasia. No TNK given due to on Eliquis .  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   Ongoing aggressive stroke risk factor management Therapy recommendations:  HH PT Disposition: Pending  History of stroke 07/2018 admitted for left thalamic small ICH.  CTA head and neck showed right ICA 50% and left ICA 65% stenosis.  Moderate bilateral siphon left more than right, hypoplastic posterior circulation.  EF 60 to 65%.  Patient has residual right-sided weakness  AF Home Eliquis  2.5 mg twice daily PTA Compliant with medication per family Now on heparin  IV Consider resume Eliquis  once p.o. access  Hypertension Stable Avoid low BP given bilateral ICA stenosis Long term BP goal normotensive  Hyperlipidemia Home meds: Crestor  5 LDL 31, goal < 70 Consider to resume Crestor  once p.o. access Continue statin at discharge  Dysphagia Severe dysarthria Did not pass swallow Currently n.p.o. Speech on board On IVF with D5NS @ 52 May consider tube feeding  Other Stroke Risk Factors Advanced age Coronary artery disease status post CABG Diastolic CHF History of PE  Other Active Problems Lung cancer in remission BPH on chronic Foley Rheumatoid arthritis POD Thrombocytopenia platelet 100  Hospital day # 0  I discussed with Dr. Trixie. I spent extensive total face-to-face time with the patient son and daughter, reviewing test results, images and medication, and discussing the diagnosis, treatment plan and potential prognosis and different venues of disposition. This patient's care requiresreview of multiple databases, neurological assessment, discussion with family, other specialists and medical decision making of high complexity.   Ary Cummins, MD PhD Stroke Neurology 09/23/2023 1:25 PM    To contact Stroke Continuity  provider, please refer to [email or web address]. After hours, contact General Neurology

## 2023-09-23 NOTE — Plan of Care (Signed)
  Problem: Education: Goal: Knowledge of disease or condition will improve Outcome: Progressing   Problem: Ischemic Stroke/TIA Tissue Perfusion: Goal: Complications of ischemic stroke/TIA will be minimized Outcome: Progressing   Problem: Health Behavior/Discharge Planning: Goal: Goals will be collaboratively established with patient/family Outcome: Progressing   Problem: Education: Goal: Knowledge of General Education information will improve Description: Including pain rating scale, medication(s)/side effects and non-pharmacologic comfort measures Outcome: Progressing   Problem: Activity: Goal: Risk for activity intolerance will decrease Outcome: Progressing   Problem: Elimination: Goal: Will not experience complications related to urinary retention Outcome: Progressing

## 2023-09-23 NOTE — Procedures (Signed)
 Patient Name: Evan [name]  MRN: [id number]  Epilepsy Attending: Arlin MALVA Krebs  Referring Physician/Provider: Jerrie Lola CROME, MD  Date: 09/22/2023 Duration: 21.20 mins  Patient history:  89 y.o. male with a past medical history of multiple vascular risk factors as documented above, presenting with acute onset aphasia. EEG to evaluate for seizure.  Level of alertness: Awake  AEDs during EEG study: None  Technical aspects: This EEG study was done with scalp electrodes positioned according to the 10-20 International system of electrode placement. Electrical activity was reviewed with band pass filter of 1-70Hz , sensitivity of 7 uV/mm, display speed of 88mm/sec with a 60Hz  notched filter applied as appropriate. EEG data were recorded continuously and digitally stored.  Video monitoring was available and reviewed as appropriate.  Description: EEG showed continuous generalized 3 to 6 Hz theta-delta slowing. Hyperventilation and photic stimulation were not performed.     ABNORMALITY - Continuous slow, generalized  IMPRESSION: This study is suggestive of moderate diffuse encephalopathy. No seizures or epileptiform discharges were seen throughout the recording.  Zoeann Mol O Thais Silberstein

## 2023-09-23 NOTE — Progress Notes (Incomplete)
 STROKE TEAM PROGRESS NOTE    SIGNIFICANT HOSPITAL EVENTS   INTERIM HISTORY/SUBJECTIVE    OBJECTIVE  CBC    Component Value Date/Time   WBC 6.1 09/22/2023 1957   RBC 2.59 (L) 09/22/2023 1957   HGB 9.2 (L) 09/22/2023 1959   HGB 10.2 (L) 10/14/2022 0930   HGB 13.4 08/16/2016 1155   HCT 27.0 (L) 09/22/2023 1959   HCT 31.0 (L) 10/14/2022 0930   HCT 39.8 08/16/2016 1155   PLT 100 (L) 09/22/2023 1957   PLT 75 (LL) 10/14/2022 0930   MCV 105.4 (H) 09/22/2023 1957   MCV 98 (H) 10/14/2022 0930   MCV 92.3 08/16/2016 1155   MCH 34.0 09/22/2023 1957   MCHC 32.2 09/22/2023 1957   RDW 14.9 09/22/2023 1957   RDW 13.4 10/14/2022 0930   RDW 13.3 08/16/2016 1155   LYMPHSABS 1.5 09/22/2023 1957   LYMPHSABS 1.3 08/16/2016 1155   MONOABS 0.4 09/22/2023 1957   MONOABS 1.2 (H) 08/16/2016 1155   EOSABS 0.0 09/22/2023 1957   EOSABS 0.1 08/16/2016 1155   BASOSABS 0.0 09/22/2023 1957   BASOSABS 0.0 08/16/2016 1155    BMET    Component Value Date/Time   NA 137 09/22/2023 1959   NA 136 10/14/2022 0930   NA 143 08/16/2016 1155   K 3.0 (L) 09/22/2023 1959   K 4.3 08/16/2016 1155   CL 103 09/22/2023 1959   CO2 20 (L) 09/22/2023 1957   CO2 24 08/16/2016 1155   GLUCOSE 124 (H) 09/22/2023 1959   GLUCOSE 88 08/16/2016 1155   GLUCOSE 120 (H) 04/20/2015 1600   BUN 20 09/22/2023 1959   BUN 24 10/14/2022 0930   BUN 14.6 08/16/2016 1155   CREATININE 0.90 09/22/2023 1959   CREATININE 1.33 (H) 08/10/2023 1633   CREATININE 1.2 08/16/2016 1155   CALCIUM  8.6 (L) 09/22/2023 1957   CALCIUM  9.0 08/16/2016 1155   EGFR 50 (L) 08/10/2023 1633   EGFR 43 (L) 10/14/2022 0930   GFRNONAA >60 09/22/2023 1957   GFRNONAA 47 (L) 06/22/2020 1602    IMAGING past 24 hours EEG adult Result Date: 09/23/2023 Shelton Arlin KIDD, MD     09/23/2023  6:31 AM Patient Name: Evan [name] MRN: [id number] Epilepsy Attending: Arlin KIDD Shelton Referring Physician/Provider: Jerrie Lola CROME, MD Date: 09/22/2023 Duration: 21.20  mins Patient history:  89 y.o. male with a past medical history of multiple vascular risk factors as documented above, presenting with acute onset aphasia. EEG to evaluate for seizure. Level of alertness: Awake AEDs during EEG study: None Technical aspects: This EEG study was done with scalp electrodes positioned according to the 10-20 International system of electrode placement. Electrical activity was reviewed with band pass filter of 1-70Hz , sensitivity of 7 uV/mm, display speed of 73mm/sec with a 60Hz  notched filter applied as appropriate. EEG data were recorded continuously and digitally stored.  Video monitoring was available and reviewed as appropriate. Description: EEG showed continuous generalized 3 to 6 Hz theta-delta slowing. Hyperventilation and photic stimulation were not performed.   ABNORMALITY - Continuous slow, generalized IMPRESSION: This study is suggestive of moderate diffuse encephalopathy. No seizures or epileptiform discharges were seen throughout the recording. Arlin KIDD Shelton   MR BRAIN WO CONTRAST Result Date: 09/23/2023 CLINICAL DATA:  Delirium EXAM: MRI HEAD WITHOUT CONTRAST TECHNIQUE: Multiplanar, multiecho pulse sequences of the brain and surrounding structures were obtained without intravenous contrast. COMPARISON:  None Available. FINDINGS: Truncated and motion degraded examination due to patient altered mental status and inability to remain still.  Only diffusion-weighted imaging and axial T2-weighted FLAIR sequence were performed. There are small areas of acute ischemia within both superior frontal lobes, affecting the right MCA territory and left ACA territory. Probable punctate focus of acute ischemia in the left occipital lobe. There is multifocal hyperintense T2-weighted signal within the periventricular and deep white matter. IMPRESSION: 1. Truncated and motion degraded examination due to patient altered mental status. 2. Small areas of acute ischemia within both superior  frontal lobes, affecting the right MCA territory and left ACA territory. 3. Probable punctate focus of acute ischemia in the left occipital lobe. Electronically Signed   By: Franky Stanford M.D.   On: 09/23/2023 03:40   CT ANGIO HEAD NECK W WO CM (CODE STROKE) Result Date: 09/22/2023 CLINICAL DATA:  Aphasia EXAM: CT ANGIOGRAPHY HEAD AND NECK WITH AND WITHOUT CONTRAST TECHNIQUE: Multidetector CT imaging of the head and neck was performed using the standard protocol during bolus administration of intravenous contrast. Multiplanar CT image reconstructions and MIPs were obtained to evaluate the vascular anatomy. Carotid stenosis measurements (when applicable) are obtained utilizing NASCET criteria, using the distal internal carotid diameter as the denominator. RADIATION DOSE REDUCTION: This exam was performed according to the departmental dose-optimization program which includes automated exposure control, adjustment of the mA and/or kV according to patient size and/or use of iterative reconstruction technique. CONTRAST:  75mL OMNIPAQUE  IOHEXOL  350 MG/ML SOLN COMPARISON:  07/14/2018 FINDINGS: CTA NECK FINDINGS Skeleton: No acute abnormality or high grade bony spinal canal stenosis. Other neck: Normal pharynx, larynx and major salivary glands. No cervical lymphadenopathy. Unremarkable thyroid  gland. Upper chest: Emphysema and unchanged right paratracheal adenopathy. Aortic arch: There is calcific atherosclerosis of the aortic arch. Conventional 3 vessel aortic branching pattern. RIGHT carotid system: No dissection, occlusion or aneurysm. There is mixed density atherosclerosis extending into the proximal ICA, resulting in 50% stenosis. LEFT carotid system: Bulky, circumferential, mixed density atherosclerotic disease at the left carotid bifurcation extending into the proximal external and internal carotid arteries. There is 60% proximal left ICA stenosis. Vertebral arteries: Left dominant configuration. There is no  dissection, occlusion or flow-limiting stenosis to the skull base (V1-V3 segments). CTA HEAD FINDINGS POSTERIOR CIRCULATION: Mild atherosclerosis of the left vertebral artery V4 segment. Right vertebral artery is diminutive but patent. No proximal occlusion of the anterior or inferior cerebellar arteries. Basilar artery is normal. Superior cerebellar arteries are normal. Posterior cerebral arteries are normal. ANTERIOR CIRCULATION: Atherosclerotic calcification of the internal carotid arteries at the skull base without hemodynamically significant stenosis. Azygous anterior cerebral artery. Middle cerebral arteries are normal. Venous sinuses: As permitted by contrast timing, patent. Anatomic variants: Fetal origins of both posterior cerebral arteries. Review of the MIP images confirms the above findings. IMPRESSION: 1. No emergent large vessel occlusion. 2. Bilateral carotid bifurcation atherosclerosis with 50% stenosis of the proximal right ICA and 60% stenosis of the proximal left ICA. Aortic Atherosclerosis (ICD10-I70.0) and Emphysema (ICD10-J43.9). Electronically Signed   By: Franky Stanford M.D.   On: 09/22/2023 20:36   CT HEAD CODE STROKE WO CONTRAST Result Date: 09/22/2023 CLINICAL DATA:  Code stroke.  Acute neurologic deficit EXAM: CT HEAD WITHOUT CONTRAST TECHNIQUE: Contiguous axial images were obtained from the base of the skull through the vertex without intravenous contrast. RADIATION DOSE REDUCTION: This exam was performed according to the departmental dose-optimization program which includes automated exposure control, adjustment of the mA and/or kV according to patient size and/or use of iterative reconstruction technique. COMPARISON:  04/28/2022 FINDINGS: Brain: There is no mass, hemorrhage or  extra-axial collection. There is generalized atrophy without lobar predilection. There is hypoattenuation of the periventricular white matter, most commonly indicating chronic ischemic microangiopathy.  Vascular: Atherosclerotic calcification of the vertebral and internal carotid arteries at the skull base. No abnormal hyperdensity of the major intracranial arteries or dural venous sinuses. Skull: The visualized skull base, calvarium and extracranial soft tissues are normal. Sinuses/Orbits: Mild ethmoid sinus mucosal thickening. The orbits are normal. ASPECTS [facility] Stroke Program Early CT Score) - Ganglionic level infarction (caudate, lentiform nuclei, internal capsule, insula, M1-M3 cortex): 7 - Supraganglionic infarction (M4-M6 cortex): 3 Total score (0-10 with 10 being normal): 10 IMPRESSION: 1. No acute intracranial abnormality. 2. ASPECTS is 10. These results were communicated to Dr. Lola Jernigan at 8:04 pm on 09/22/2023 by text page via the [facility] messaging system. Electronically Signed   By: Franky Stanford M.D.   On: 09/22/2023 20:05    Vitals:   09/23/23 0230 09/23/23 0245 09/23/23 0547 09/23/23 0735  BP: 124/63 (!) 119/58 133/68 108/63  Pulse: 75 82 78 81  Resp: 18 (!) 24 20 19   Temp:   98.1 F (36.7 C) 98.3 F (36.8 C)  TempSrc:   Oral   SpO2: 97% 93% 96% 94%  Height:         PHYSICAL EXAM General:  Alert, well-nourished, well-developed patient in no acute distress Psych:  Mood and affect appropriate for situation CV: Regular rate and rhythm on monitor Respiratory:  Regular, unlabored respirations on room air GI: Abdomen soft and nontender   NEURO:  Mental Status: AA&Ox3, patient is able to give clear and coherent history Speech/Language: speech is without dysarthria or aphasia.  Naming, repetition, fluency, and comprehension intact.  Cranial Nerves:  II: PERRL. Visual fields full.  III, IV, VI: EOMI. Eyelids elevate symmetrically.  V: Sensation is intact to light touch and symmetrical to face.  VII: Face is symmetrical resting and smiling VIII: hearing intact to voice. IX, X: Palate elevates symmetrically. Phonation is normal.  KP:Dynloizm shrug 5/5. XII: tongue is  midline without fasciculations. Motor: 5/5 strength to all muscle groups tested.  Tone: is normal and bulk is normal Sensation- Intact to light touch bilaterally. Extinction absent to light touch to DSS.   Coordination: FTN intact bilaterally, HKS: no ataxia in BLE.No drift.  Gait- deferred  Most Recent NIH ***     ASSESSMENT/PLAN  Mr. EBRIMA RANTA is a 89 y.o. male with history of prior CVA, SDH, dementia, lung CA, CAD, RA, depression/anxiety brought to ED d/t aphasia. MRI of the brain showed small areas of acute ischemia within both superior frontal lobes in the right MCA and left ACA territories, admitted for full stroke work-up.  NIH on Admission 19.  {Neurological Diagnosis:29705}:  {gen right left:310021} *** s/p {Stroke Treatment:29704} Etiology:  ***  Code Stroke ***CT head No acute abnormality. ***Small vessel disease. Atrophy. ***ASPECTS 10. ***   CTA head & neck *** CT perfusion *** Post IR CT *** MRI  *** MRA  *** Carotid Doppler  *** 2D Echo *** LDL 31 HgbA1c 5.7 VTE prophylaxis - *** {anticoagulants:31417} prior to admission, now on {anticoagulants:31417} for *** weeks and then *** alone. Therapy recommendations:  {Therapy Recommendations at Discharge:29703} Disposition:  ***  Hx of Stroke/TIA ***  Atrial fibrillation Home Meds:  Continue telemetry monitoring Begin anticoagulation with ***   Hypertension Home meds:  *** Uns***Stable {Blood Pressure Goal:29586}   Hyperlipidemia Home meds:  ***, ***resumed in hospital LDL 31, goal < 70 Add ***  High  intensity statin not indicated *** Continue statin at discharge  Diabetes type II Unc***Controlled Home meds:  *** HgbA1c 5.7, goal < 7.0 CBGs SSI Recommend close follow-up with PCP for better DM control  Tobacco Abuse Patient smokes *** packs per day for *** years {Ready to Quit?:21018006} Nicotine replacement therapy provided  Substance Abuse Patient uses *** UDS positive for *** THC ***  Cocaine {Ready to Quit?:21018006} TOC consult for cessation placed  Dysphagia Patient has post-stroke dysphagia, SLP consulted    Diet   Diet NPO time specified   Advance diet as tolerated  Other Stroke Risk Factors ***ETOH use, alcohol  level <15, advised to drink no more than 1***2 drink(s) a day ***Obesity, Body mass index is 23.59 kg/m., BMI >/= 30 associated with increased stroke risk, recommend weight loss, diet and exercise as appropriate  ***Family hx stroke (***) ***Coronary artery disease ***Congestive heart failure ***Obstructive sleep apnea, ***on CPAP at home ***Migraines   Other Active Problems   Hospital day # 0    To contact Stroke Continuity provider, please refer to [email or web address]. After hours, contact General Neurology

## 2023-09-23 NOTE — ED Notes (Signed)
 Pt turned onto right side for comfort with a pillow underneath to assist in propping him up.

## 2023-09-24 ENCOUNTER — Inpatient Hospital Stay (HOSPITAL_COMMUNITY)

## 2023-09-24 DIAGNOSIS — I63413 Cerebral infarction due to embolism of bilateral middle cerebral arteries: Secondary | ICD-10-CM | POA: Diagnosis not present

## 2023-09-24 DIAGNOSIS — I110 Hypertensive heart disease with heart failure: Secondary | ICD-10-CM | POA: Diagnosis not present

## 2023-09-24 DIAGNOSIS — I6389 Other cerebral infarction: Secondary | ICD-10-CM

## 2023-09-24 DIAGNOSIS — I63422 Cerebral infarction due to embolism of left anterior cerebral artery: Secondary | ICD-10-CM | POA: Diagnosis not present

## 2023-09-24 DIAGNOSIS — I6340 Cerebral infarction due to embolism of unspecified cerebral artery: Secondary | ICD-10-CM | POA: Diagnosis not present

## 2023-09-24 DIAGNOSIS — I480 Paroxysmal atrial fibrillation: Secondary | ICD-10-CM | POA: Diagnosis not present

## 2023-09-24 LAB — ECHOCARDIOGRAM COMPLETE
AR max vel: 1.28 cm2
AV Area VTI: 1.43 cm2
AV Area mean vel: 1.37 cm2
AV Mean grad: 6 mmHg
AV Peak grad: 13.7 mmHg
Ao pk vel: 1.85 m/s
Height: 62 in
S' Lateral: 1.6 cm
Single Plane A4C EF: 62.3 %
Weight: 2063.51 [oz_av]

## 2023-09-24 LAB — CBC
HCT: 27.8 % — ABNORMAL LOW (ref 39.0–52.0)
Hemoglobin: 9.1 g/dL — ABNORMAL LOW (ref 13.0–17.0)
MCH: 33.5 pg (ref 26.0–34.0)
MCHC: 32.7 g/dL (ref 30.0–36.0)
MCV: 102.2 fL — ABNORMAL HIGH (ref 80.0–100.0)
Platelets: 132 10*3/uL — ABNORMAL LOW (ref 150–400)
RBC: 2.72 MIL/uL — ABNORMAL LOW (ref 4.22–5.81)
RDW: 15.4 % (ref 11.5–15.5)
WBC: 7.6 10*3/uL (ref 4.0–10.5)
nRBC: 0 % (ref 0.0–0.2)

## 2023-09-24 LAB — PHOSPHORUS: Phosphorus: 3.1 mg/dL (ref 2.5–4.6)

## 2023-09-24 LAB — COMPREHENSIVE METABOLIC PANEL WITH GFR
ALT: 11 U/L (ref 0–44)
AST: 18 U/L (ref 15–41)
Albumin: 2.6 g/dL — ABNORMAL LOW (ref 3.5–5.0)
Alkaline Phosphatase: 83 U/L (ref 38–126)
Anion gap: 3 — ABNORMAL LOW (ref 5–15)
BUN: 12 mg/dL (ref 8–23)
CO2: 22 mmol/L (ref 22–32)
Calcium: 8 mg/dL — ABNORMAL LOW (ref 8.9–10.3)
Chloride: 112 mmol/L — ABNORMAL HIGH (ref 98–111)
Creatinine, Ser: 1.03 mg/dL (ref 0.61–1.24)
GFR, Estimated: >60 mL/min (ref >60)
Glucose, Bld: 123 mg/dL — ABNORMAL HIGH (ref 70–99)
Potassium: 3.1 mmol/L — ABNORMAL LOW (ref 3.5–5.1)
Sodium: 137 mmol/L (ref 135–145)
Total Bilirubin: 1.2 mg/dL (ref 0.0–1.2)
Total Protein: 6.2 g/dL — ABNORMAL LOW (ref 6.5–8.1)

## 2023-09-24 LAB — MAGNESIUM: Magnesium: 1.8 mg/dL (ref 1.7–2.4)

## 2023-09-24 LAB — APTT: aPTT: 59 s — ABNORMAL HIGH (ref 24–36)

## 2023-09-24 LAB — HEPARIN LEVEL (UNFRACTIONATED): Heparin Unfractionated: >1.1 [IU]/mL — ABNORMAL HIGH (ref 0.30–0.70)

## 2023-09-24 MED ORDER — POTASSIUM CHLORIDE 10 MEQ/100ML IV SOLN
10.0000 meq | INTRAVENOUS | Status: AC
  Administered 2023-09-24 (×4): 10 meq via INTRAVENOUS
  Filled 2023-09-24 (×4): qty 100

## 2023-09-24 MED ORDER — GUAIFENESIN 100 MG/5ML PO LIQD
5.0000 mL | ORAL | Status: DC | PRN
  Administered 2023-09-24: 5 mL via ORAL
  Filled 2023-09-24: qty 5

## 2023-09-24 MED ORDER — APIXABAN 2.5 MG PO TABS
2.5000 mg | ORAL_TABLET | Freq: Two times a day (BID) | ORAL | Status: DC
  Administered 2023-09-24 – 2023-09-26 (×5): 2.5 mg via ORAL
  Filled 2023-09-24 (×5): qty 1

## 2023-09-24 NOTE — Progress Notes (Signed)
 PHARMACY - ANTICOAGULATION CONSULT NOTE  Pharmacy Consult for Heparin  Indication: atrial fibrillation  Allergies  Allergen Reactions   Feldene [Piroxicam] Rash    Blistering rash     Neomycin -Polymyxin-Hc Itching   Statins Other (See Comments)    Myalgias   Latex Rash    Band Aid   Tequin [Gatifloxacin] Swelling and Anxiety   Valium [Diazepam] Other (See Comments)    Dizziness    Vibramycin  [Doxycycline ] Itching and Nausea Only    Patient Measurements: Height: 5' 2 (157.5 cm) Weight: 58.5 kg (128 lb 15.5 oz) (09/09/2023) IBW/kg (Calculated) : 54.6 HEPARIN  DW (KG): 58.5  Vital Signs: Temp: 98.4 F (36.9 C) (06/22 0746) Temp Source: Axillary (06/22 0746) BP: 121/74 (06/22 0746) Pulse Rate: 99 (06/22 0746)  Labs: Recent Labs    09/22/23 1957 09/22/23 1959 09/23/23 2217 09/24/23 0545 09/24/23 0849  HGB 8.8* 9.2*  --  9.1*  --   HCT 27.3* 27.0*  --  27.8*  --   PLT 100*  --   --  132*  --   APTT 42*  --  63*  --  59*  LABPROT 17.3*  --   --   --   --   INR 1.4*  --   --   --   --   HEPARINUNFRC  --   --  >1.10*  --  >1.10*  CREATININE 1.02 0.90  --  1.03  --     Estimated Creatinine Clearance: 33.9 mL/min (by C-G formula based on SCr of 1.03 mg/dL).  Assessment: 89 yo male with h/o afib on apixaban  2.5mg  BID. Last dose 09/22/23 at 1000. Patient now NPO and, therefore, unable to resume apixaban  at this time. MD wishes to place patient on heparin  drip (no bolus)  for stroke prophylaxis.   Will use APTT initially due to prior DOAC until Heparin  levels and APTT correlate.   AM: aPTT slightly below goal and heparin  level falsely elevated given DOAC use. Per RN, no signs/symptoms of bleeding or issues with the heparin  infusion running continuously.  Goal of Therapy:  aPTT 66-85 seconds Heparin  level 0.3-0.5 Monitor platelets by anticoagulation protocol: Yes   Plan:  Increase heparin  drip at 950 units/hr, no boluses APTT in 8 hours  Daily heparin  level, APTT  (until heparin  level and APTT correlate)  Donny Alert, PharmD, [facility] Clinical Pharmacist Please see AMION for all Pharmacists' Contact Phone Numbers 09/24/2023, 9:51 AM

## 2023-09-24 NOTE — Progress Notes (Signed)
 STROKE TEAM PROGRESS NOTE   SUBJECTIVE (INTERVAL HISTORY) Daughter is at the bedside.  Pt lying in bed, initially sleeping but eyes open on voice, still has severe dysarthria and right hemiplegia. However, passed swallow this morning with dys3 diet and thin liquid. Will monitor his po intake today.    OBJECTIVE Temp:  [98.2 F (36.8 C)-98.5 F (36.9 C)] 98.4 F (36.9 C) (06/22 0746) Pulse Rate:  [91-117] 99 (06/22 0746) Cardiac Rhythm: Atrial fibrillation (06/22 0940) Resp:  [18-20] 20 (06/22 0746) BP: (109-132)/(67-75) 121/74 (06/22 0746) SpO2:  [94 %-96 %] 96 % (06/22 0802) Weight:  [58.5 kg] 58.5 kg (06/21 2300)  Recent Labs  Lab 09/22/23 1944  GLUCAP 132*   Recent Labs  Lab 09/22/23 1957 09/22/23 1959 09/22/23 2152 09/24/23 0545  NA 136 137  --  137  K 3.1* 3.0*  --  3.1*  CL 106 103  --  112*  CO2 20*  --   --  22  GLUCOSE 118* 124*  --  123*  BUN 21 20  --  12  CREATININE 1.02 0.90  --  1.03  CALCIUM  8.6*  --   --  8.0*  MG  --   --  1.7 1.8  PHOS  --   --   --  3.1   Recent Labs  Lab 09/22/23 1957 09/24/23 0545  AST 22 18  ALT 13 11  ALKPHOS 86 83  BILITOT 0.4 1.2  PROT 6.8 6.2*  ALBUMIN 2.7* 2.6*   Recent Labs  Lab 09/22/23 1957 09/22/23 1959 09/24/23 0545  WBC 6.1  --  7.6  NEUTROABS 4.1  --   --   HGB 8.8* 9.2* 9.1*  HCT 27.3* 27.0* 27.8*  MCV 105.4*  --  102.2*  PLT 100*  --  132*   No results for input(s): CKTOTAL, CKMB, CKMBINDEX, TROPONINI in the last 168 hours. Recent Labs    09/22/23 1957  LABPROT 17.3*  INR 1.4*   Recent Labs    09/22/23 2048  COLORURINE STRAW*  LABSPEC 1.015  PHURINE 7.0  GLUCOSEU NEGATIVE  HGBUR NEGATIVE  BILIRUBINUR NEGATIVE  KETONESUR NEGATIVE  PROTEINUR NEGATIVE  NITRITE NEGATIVE  LEUKOCYTESUR NEGATIVE       Component Value Date/Time   CHOL 70 09/23/2023 0447   CHOL 91 (L) 06/22/2020 0953   CHOL 97 09/09/2013 0957   TRIG 49 09/23/2023 0447   TRIG 92 09/09/2013 0957   HDL 29 (L)  09/23/2023 0447   HDL 35 (L) 06/22/2020 0953   HDL 37 (L) 09/09/2013 0957   CHOLHDL 2.4 09/23/2023 0447   VLDL 10 09/23/2023 0447   LDLCALC 31 09/23/2023 0447   LDLCALC 41 06/22/2020 0953   LDLCALC 42 09/09/2013 0957   Lab Results  Component Value Date   HGBA1C 5.7 (H) 09/22/2023      Component Value Date/Time   LABOPIA NONE DETECTED 09/22/2023 2040   COCAINSCRNUR NONE DETECTED 09/22/2023 2040   LABBENZ NONE DETECTED 09/22/2023 2040   AMPHETMU NONE DETECTED 09/22/2023 2040   THCU NONE DETECTED 09/22/2023 2040   LABBARB NONE DETECTED 09/22/2023 2040    Recent Labs  Lab 09/22/23 1950  ETH <15    I have personally reviewed the radiological images below and agree with the radiology interpretations.  DG CHEST PORT 1 VIEW Result Date: 09/23/2023 CLINICAL DATA:  Wheezing. EXAM: PORTABLE CHEST 1 VIEW COMPARISON:  09/13/2023 FINDINGS: Stable cardiomediastinal contours scratch set status post median sternotomy and CABG procedure. Aortic atherosclerosis. Stable cardiomediastinal  contours. Chronic coarsened interstitial markings are noted bilaterally. Blunting of the right costophrenic angle. Right midlung and right base hazy opacities noted which appears similar to 09/10/2023 when there was right lower lobe consolidative change. The visualized osseous structures are unremarkable. IMPRESSION: 1. Right midlung and right base hazy opacities which may reflect residual/persistent pneumonia. 2. Chronic coarsened interstitial markings bilaterally. 3. Blunting of the right costophrenic angle may reflect a small effusion. Electronically Signed   By: Waddell Calk M.D.   On: 09/23/2023 13:50   EEG adult Result Date: 09/23/2023 Shelton Arlin KIDD, MD     09/23/2023  6:31 AM Patient Name: ARDIS FULLWOOD MRN: [id number] Epilepsy Attending: Arlin KIDD Shelton Referring Physician/Provider: Jerrie Lola CROME, MD Date: 09/22/2023 Duration: 21.20 mins Patient history:  89 y.o. male with a past medical history of  multiple vascular risk factors as documented above, presenting with acute onset aphasia. EEG to evaluate for seizure. Level of alertness: Awake AEDs during EEG study: None Technical aspects: This EEG study was done with scalp electrodes positioned according to the 10-20 International system of electrode placement. Electrical activity was reviewed with band pass filter of 1-70Hz , sensitivity of 7 uV/mm, display speed of 63mm/sec with a 60Hz  notched filter applied as appropriate. EEG data were recorded continuously and digitally stored.  Video monitoring was available and reviewed as appropriate. Description: EEG showed continuous generalized 3 to 6 Hz theta-delta slowing. Hyperventilation and photic stimulation were not performed.   ABNORMALITY - Continuous slow, generalized IMPRESSION: This study is suggestive of moderate diffuse encephalopathy. No seizures or epileptiform discharges were seen throughout the recording. Arlin KIDD Shelton   MR BRAIN WO CONTRAST Result Date: 09/23/2023 CLINICAL DATA:  Delirium EXAM: MRI HEAD WITHOUT CONTRAST TECHNIQUE: Multiplanar, multiecho pulse sequences of the brain and surrounding structures were obtained without intravenous contrast. COMPARISON:  None Available. FINDINGS: Truncated and motion degraded examination due to patient altered mental status and inability to remain still. Only diffusion-weighted imaging and axial T2-weighted FLAIR sequence were performed. There are small areas of acute ischemia within both superior frontal lobes, affecting the right MCA territory and left ACA territory. Probable punctate focus of acute ischemia in the left occipital lobe. There is multifocal hyperintense T2-weighted signal within the periventricular and deep white matter. IMPRESSION: 1. Truncated and motion degraded examination due to patient altered mental status. 2. Small areas of acute ischemia within both superior frontal lobes, affecting the right MCA territory and left ACA  territory. 3. Probable punctate focus of acute ischemia in the left occipital lobe. Electronically Signed   By: Franky Stanford M.D.   On: 09/23/2023 03:40   CT ANGIO HEAD NECK W WO CM (CODE STROKE) Result Date: 09/22/2023 CLINICAL DATA:  Aphasia EXAM: CT ANGIOGRAPHY HEAD AND NECK WITH AND WITHOUT CONTRAST TECHNIQUE: Multidetector CT imaging of the head and neck was performed using the standard protocol during bolus administration of intravenous contrast. Multiplanar CT image reconstructions and MIPs were obtained to evaluate the vascular anatomy. Carotid stenosis measurements (when applicable) are obtained utilizing NASCET criteria, using the distal internal carotid diameter as the denominator. RADIATION DOSE REDUCTION: This exam was performed according to the departmental dose-optimization program which includes automated exposure control, adjustment of the mA and/or kV according to patient size and/or use of iterative reconstruction technique. CONTRAST:  75mL OMNIPAQUE  IOHEXOL  350 MG/ML SOLN COMPARISON:  07/14/2018 FINDINGS: CTA NECK FINDINGS Skeleton: No acute abnormality or high grade bony spinal canal stenosis. Other neck: Normal pharynx, larynx and major salivary glands. No cervical  lymphadenopathy. Unremarkable thyroid  gland. Upper chest: Emphysema and unchanged right paratracheal adenopathy. Aortic arch: There is calcific atherosclerosis of the aortic arch. Conventional 3 vessel aortic branching pattern. RIGHT carotid system: No dissection, occlusion or aneurysm. There is mixed density atherosclerosis extending into the proximal ICA, resulting in 50% stenosis. LEFT carotid system: Bulky, circumferential, mixed density atherosclerotic disease at the left carotid bifurcation extending into the proximal external and internal carotid arteries. There is 60% proximal left ICA stenosis. Vertebral arteries: Left dominant configuration. There is no dissection, occlusion or flow-limiting stenosis to the skull base  (V1-V3 segments). CTA HEAD FINDINGS POSTERIOR CIRCULATION: Mild atherosclerosis of the left vertebral artery V4 segment. Right vertebral artery is diminutive but patent. No proximal occlusion of the anterior or inferior cerebellar arteries. Basilar artery is normal. Superior cerebellar arteries are normal. Posterior cerebral arteries are normal. ANTERIOR CIRCULATION: Atherosclerotic calcification of the internal carotid arteries at the skull base without hemodynamically significant stenosis. Azygous anterior cerebral artery. Middle cerebral arteries are normal. Venous sinuses: As permitted by contrast timing, patent. Anatomic variants: Fetal origins of both posterior cerebral arteries. Review of the MIP images confirms the above findings. IMPRESSION: 1. No emergent large vessel occlusion. 2. Bilateral carotid bifurcation atherosclerosis with 50% stenosis of the proximal right ICA and 60% stenosis of the proximal left ICA. Aortic Atherosclerosis (ICD10-I70.0) and Emphysema (ICD10-J43.9). Electronically Signed   By: Franky Stanford M.D.   On: 09/22/2023 20:36   CT HEAD CODE STROKE WO CONTRAST Result Date: 09/22/2023 CLINICAL DATA:  Code stroke.  Acute neurologic deficit EXAM: CT HEAD WITHOUT CONTRAST TECHNIQUE: Contiguous axial images were obtained from the base of the skull through the vertex without intravenous contrast. RADIATION DOSE REDUCTION: This exam was performed according to the departmental dose-optimization program which includes automated exposure control, adjustment of the mA and/or kV according to patient size and/or use of iterative reconstruction technique. COMPARISON:  04/28/2022 FINDINGS: Brain: There is no mass, hemorrhage or extra-axial collection. There is generalized atrophy without lobar predilection. There is hypoattenuation of the periventricular white matter, most commonly indicating chronic ischemic microangiopathy. Vascular: Atherosclerotic calcification of the vertebral and internal  carotid arteries at the skull base. No abnormal hyperdensity of the major intracranial arteries or dural venous sinuses. Skull: The visualized skull base, calvarium and extracranial soft tissues are normal. Sinuses/Orbits: Mild ethmoid sinus mucosal thickening. The orbits are normal. ASPECTS [facility] Stroke Program Early CT Score) - Ganglionic level infarction (caudate, lentiform nuclei, internal capsule, insula, M1-M3 cortex): 7 - Supraganglionic infarction (M4-M6 cortex): 3 Total score (0-10 with 10 being normal): 10 IMPRESSION: 1. No acute intracranial abnormality. 2. ASPECTS is 10. These results were communicated to Dr. Lola Jernigan at 8:04 pm on 09/22/2023 by text page via the [facility] messaging system. Electronically Signed   By: Franky Stanford M.D.   On: 09/22/2023 20:05   DG CHEST PORT 1 VIEW Result Date: 09/13/2023 CLINICAL DATA:  Sudden onset shortness of breath EXAM: PORTABLE CHEST 1 VIEW COMPARISON:  09/10/2023 FINDINGS: Cardiac shadow is prominent but stable. Postsurgical changes are noted. Aortic calcifications are seen. Slight increase in the degree of interstitial changes are noted consistent with worsening edema. No focal confluent infiltrate or effusion is noted. IMPRESSION: Worsening edema. Electronically Signed   By: Oneil Devonshire M.D.   On: 09/13/2023 00:26   DG Swallowing Func-Speech Pathology Result Date: 09/10/2023 Table formatting from the original result was not included. Modified Barium Swallow Study Patient Details Name: ALEZANDER DIMAANO MRN: [id number] Date of Birth: [birth year] Today's Date:  09/10/2023 HPI/PMH: HPI: BRIER REID is a 89 y.o. male with medical history significant of dementia, CAD status post CABG, HFpEF, CVA, hypertension, hyperlipidemia, CKD stage IIIa, COPD, PVD, history of PE on Eliquis , lung cancer, anxiety, BPH with chronic Foley, depression, esophageal ulcer, GERD, hemochromatosis, rheumatoid arthritis, chronic low back pain, chronic anemia and thrombocytopenia.  Recent  admission 5/11-5/19 for metapneumovirus pneumonia and heart failure exacerbation.  Admitted again 5/21-5/22 for altered mental status and right upper extremity/hand cellulitis, chest congestion, SOB. Dx with acute hypoxemic respiratory failure secondary to aspiration PNA. Patient with h/o dysphagia, multiple MBS since 2020. Study in 2020 and 2024 recommending either nectar thick or thin liquids,  study complete 08/2023 recommending regular solids with nectar thick liquids due to aspiration of thin liquids. Clinical Impression: Clinical Impression: Patient presents with an age appropriate, functional oropharyngeal swallow based on today's assessment. Oral phase appropriate with timely mastication and full oral clearance. Swallow initiated at the level of the vallecula with all boluses with the acception of thin liquids which were intermittently initiated at the pyriform sinuses resulting in flash penetration of thin liquids which is considered normal for age. No aspiration observed, even when challenged with large consecutive boluses via straw. Most significant finding was notable stasis througout esophagus, only post intake of previously noted large serial swallows of thin liquid. Liquid did not clear independently over approximately 60 seconds. Given bite of pureed solid which quickly pushed entire contents of esophagus into the stomach. Subseqent single small sips of thin liquid passed quickly. 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 Defer further w/u to MD. Education complete with son who was present for the study. Suspect that this patient, with advanced age, h/o CVA, and fluctuating dysphagia documented for at least 5 years, will continue to have fluctuating swallowing abilities with chronic risk of aspiration, exacerbated by suspected esophageal deficits. Recommending thin liquids at this time. SLP will f/u to reinforce use of precautions. Factors that may increase risk of adverse event in presence of  aspiration Noe & Lianne 2021): Factors that may increase risk of adverse event in presence of aspiration Noe & Lianne 2021): Respiratory or GI disease; Limited mobility Recommendations/Plan: Swallowing Evaluation Recommendations Swallowing Evaluation Recommendations Recommendations: PO diet PO Diet Recommendation: Regular; Thin liquids (Level 0) Liquid Administration via: Cup; Straw Medication Administration: Whole meds with puree Supervision: Patient able to self-feed; Full supervision/cueing for swallowing strategies Swallowing strategies  : Slow rate; Small bites/sips (single sips) Postural changes: Stay upright 30-60 min after meals; Position pt fully upright for meals Oral care recommendations: Oral care BID (2x/day) Recommended consults: Consider esophageal assessment Treatment Plan Treatment Plan Treatment recommendations: Therapy as outlined in treatment plan below Follow-up recommendations: Home health SLP Functional status assessment: Patient has had a recent decline in their functional status and demonstrates the ability to make significant improvements in function in a reasonable and predictable amount of time. Treatment frequency: Min 1x/week Treatment duration: 1 week Interventions: Aspiration precaution training; Compensatory techniques; Patient/family education; Diet toleration management by SLP Recommendations Recommendations for follow up therapy are one component of a multi-disciplinary discharge planning process, led by the attending physician.  Recommendations may be updated based on patient status, additional functional criteria and insurance authorization. Assessment: Orofacial Exam: Orofacial Exam Oral Cavity: Oral Hygiene: WFL Oral Cavity - Dentition: Adequate natural dentition Orofacial Anatomy: WFL Oral Motor/Sensory Function: Suspected cranial nerve impairment CN VII - Facial: Right motor impairment (baseline) Anatomy: Anatomy: Other (Comment) (what appears to be calcification  along posterior pharyhngeal wall, above  UES again noted, unchanged from previous studies) Boluses Administered: Boluses Administered Boluses Administered: Thin liquids (Level 0); Mildly thick liquids (Level 2, nectar thick); Puree; Solid  Oral Impairment Domain: Oral Impairment Domain Lip Closure: Escape beyond mid-chin Tongue control during bolus hold: Cohesive bolus between tongue to palatal seal Bolus preparation/mastication: Slow prolonged chewing/mashing with complete recollection Bolus transport/lingual motion: Brisk tongue motion Initiation of pharyngeal swallow : Pyriform sinuses  Pharyngeal Impairment Domain: Pharyngeal Impairment Domain Soft palate elevation: No bolus between soft palate (SP)/pharyngeal wall (PW) Laryngeal elevation: Complete superior movement of thyroid  cartilage with complete approximation of arytenoids to epiglottic petiole Anterior hyoid excursion: Complete anterior movement Epiglottic movement: Complete inversion Laryngeal vestibule closure: Incomplete, narrow column air/contrast in laryngeal vestibule Pharyngeal stripping wave : Present - complete Pharyngeal contraction (A/P view only): N/A Pharyngoesophageal segment opening: Complete distension and complete duration, no obstruction of flow Tongue base retraction: No contrast between tongue base and posterior pharyngeal wall (PPW) Pharyngeal residue: Complete pharyngeal clearance  Esophageal Impairment Domain: Esophageal Impairment Domain Esophageal clearance upright position: Esophageal retention Pill: Pill Consistency administered: Mildly thick liquids (Level 2, nectar thick) Mildly thick liquids (Level 2, nectar thick): WFL Penetration/Aspiration Scale Score: Penetration/Aspiration Scale Score 1.  Material does not enter airway: Mildly thick liquids (Level 2, nectar thick); Puree; Solid; Pill 2.  Material enters airway, remains ABOVE vocal cords then ejected out: Thin liquids (Level 0) Compensatory Strategies: No data recorded   General Information: Caregiver present: Yes  Diet Prior to this Study: NPO   Temperature : Normal   Respiratory Status: WFL   Supplemental O2: Nasal cannula   History of Recent Intubation: No  Behavior/Cognition: Alert; Cooperative; Pleasant mood; Requires cueing Self-Feeding Abilities: Able to self-feed Baseline vocal quality/speech: Dysphonic Volitional Cough: Able to elicit Volitional Swallow: Able to elicit No data recorded Goal Planning: Prognosis for improved oropharyngeal function: Good No data recorded No data recorded No data recorded Consulted and agree with results and recommendations: Patient; Family member/caregiver Pain: Pain Assessment Pain Assessment: No/denies pain End of Session: Start Time:SLP Start Time (ACUTE ONLY): 0935 Stop Time: SLP Stop Time (ACUTE ONLY): 0955 Time Calculation:SLP Time Calculation (min) (ACUTE ONLY): 20 min Charges: SLP Evaluations $ SLP Speech Visit: 1 Visit SLP Evaluations $BSS Swallow: 1 Procedure $MBS Swallow: 1 Procedure SLP visit diagnosis: SLP Visit Diagnosis: Dysphagia, oropharyngeal phase (R13.12) Past Medical History: Past Medical History: Diagnosis Date  Acute blood loss anemia   Acute bronchitis 05/15/2017  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 07/14/2018  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  Altered mental status 08/21/2018  Anginal chest pain at rest [facility])   Chronic non, controlled on Lorazepam   Anxiety   Arthritis   all over   BPH (benign prostatic hyperplasia)   CAD (coronary artery disease)   Cellulitis of arm, right 06/01/2018  Cellulitis of left axilla   Chronic lower back pain   Complication of anesthesia   problems making water afterwards  Depression   DJD (degenerative joint disease)   Esophageal ulcer without bleeding   bid ppi indefinitely  Fall 07/16/2018  Family history of adverse reaction to anesthesia   children w/PONV  GERD (gastroesophageal reflux disease)    Haemophilus influenzae septicemia (HCC) 03/12/2023  Headache   couple/week maybe (09/04/2015)  Hemochromatosis   Possible, elevated iron  stores, hook-like osteophytes on hand films, normal LFTs  Hepatitis   yellow jaundice as a baby  History of ASCVD   MULTIVESSEL  Hyperlipidemia   Hypertension  Hypotension   Mild aortic stenosis 06/23/2021  Osteoarthritis   Peripheral vascular disease (HCC)   Pneumonia   several times; got a little now (09/04/2015)  Pulmonary embolism (HCC) 02/02/2018  Rash and nonspecific skin eruption 06/01/2018  Rheumatoid arthritis (HCC)   RSV (respiratory syncytial virus pneumonia) 05/15/2022  Subdural hematoma (HCC)   small after fall 09/2016-plavix  held, neurosurgery consulted.  Asymptomatic.    Thromboembolism (HCC) 02/2018  on Lovenox  lifelong since he failed PO Eliquis  Past Surgical History: Past Surgical History: Procedure Laterality Date  ARM SKIN LESION BIOPSY / EXCISION Left 09/02/2015  CATARACT EXTRACTION W/ INTRAOCULAR LENS  IMPLANT, BILATERAL Bilateral   CHOLECYSTECTOMY OPEN    CORNEAL TRANSPLANT Bilateral   one at [facility]; one at [facility]  CORONARY ANGIOPLASTY WITH STENT PLACEMENT    CORONARY ARTERY BYPASS GRAFT  1999  DESCENDING AORTIC ANEURYSM REPAIR W/ STENT    DE stent ostium into the right radial free graft at OM-, 02-2007  ESOPHAGOGASTRODUODENOSCOPY N/A 11/09/2014  Procedure: ESOPHAGOGASTRODUODENOSCOPY (EGD);  Surgeon: Norleen Hint, MD;  Location: [facility] ENDOSCOPY;  Service: Endoscopy;  Laterality: N/A;  INGUINAL HERNIA REPAIR    JOINT REPLACEMENT    LEFT HEART CATH AND CORS/GRAFTS ANGIOGRAPHY N/A 06/27/2017  Procedure: LEFT HEART CATH AND CORS/GRAFTS ANGIOGRAPHY;  Surgeon: Burnard Debby LABOR, MD;  Location: [facility] INVASIVE CV LAB;  Service: Cardiovascular;  Laterality: N/A;  NASAL SINUS SURGERY    SHOULDER OPEN ROTATOR CUFF REPAIR Right   TOTAL KNEE ARTHROPLASTY Bilateral  Rea Pass MA, CCC-SLP s Pass Rea Meryl 09/10/2023, 10:32 AM  DG Chest 1 View Result Date: 09/10/2023 CLINICAL DATA:   Shortness of breath. EXAM: CHEST  1 VIEW COMPARISON:  Chest radiograph dated 08/23/2023. FINDINGS: There is diffuse chronic intra coarsening. Right lung base atelectasis/scarring. Pneumonia is not excluded. No large pleural effusion. No pneumothorax. Stable cardiac silhouette. Median sternotomy wires. No acute osseous pathology. IMPRESSION: Right lung base atelectasis/scarring. Pneumonia is not excluded. Electronically Signed   By: Vanetta Chou M.D.   On: 09/10/2023 09:44   CT Chest Wo Contrast Result Date: 09/09/2023 CLINICAL DATA:  Pneumonia, complication suspected, xray done outpt xray - concern for PNA EXAM: CT CHEST WITHOUT CONTRAST TECHNIQUE: Multidetector CT imaging of the chest was performed following the standard protocol without IV contrast. RADIATION DOSE REDUCTION: This exam was performed according to the departmental dose-optimization program which includes automated exposure control, adjustment of the mA and/or kV according to patient size and/or use of iterative reconstruction technique. COMPARISON:  08/23/2023, 03/13/2023 FINDINGS: Cardiovascular: Unenhanced imaging of the heart demonstrates cardiomegaly without pericardial effusion. Prominent left atrial dilatation. Postsurgical changes from prior CABG. Normal caliber of the thoracic aorta. Atherosclerosis of the aorta and coronary vasculature. Assessment of the vascular lumen cannot be performed without intravenous contrast. Mediastinum/Nodes: Borderline enlarged mediastinal adenopathy, measuring up to 10 mm in the right paratracheal station. Thyroid , trachea, and esophagus are unremarkable. Lungs/Pleura: Diffuse subpleural interlobular septal thickening consistent with chronic scarring. There is superimposed dense airspace disease within the dependent right lower lobe consistent with aspiration or infection. Trace right pleural effusion. No pneumothorax. Central airways are patent. Upper Abdomen: No acute abnormality. Musculoskeletal: No  acute or destructive bony abnormalities. Reconstructed images demonstrate no additional findings. IMPRESSION: 1. Dense right basilar consolidation consistent with infection or aspiration. 2. Trace right pleural effusion. 3. Basilar predominant subpleural scarring and fibrosis. 4.  Aortic Atherosclerosis (ICD10-I70.0). 5. Cardiomegaly, with diffuse coronary artery atherosclerosis. Electronically Signed   By: Ozell Daring M.D.   On: 09/09/2023 19:53  PHYSICAL EXAM  Temp:  [98.2 F (36.8 C)-98.5 F (36.9 C)] 98.4 F (36.9 C) (06/22 0746) Pulse Rate:  [91-117] 99 (06/22 0746) Resp:  [18-20] 20 (06/22 0746) BP: (109-132)/(67-75) 121/74 (06/22 0746) SpO2:  [94 %-96 %] 96 % (06/22 0802) Weight:  [58.5 kg] 58.5 kg (06/21 2300)  General - thin built with malnourished, well developed, in no apparent distress.  Ophthalmologic - fundi not visualized due to noncooperation.  Cardiovascular - irregularly irregular heart rate and rhythm.  Neuro - awake, alert, eyes open, hard of hearing, with family assistant, patient was able to orientated to age and place, but not to time.  Severe dysarthria, hard to comprehend with intermittent intangible words.  No gaze palsy, tracking bilaterally, not consistently blinking to visual threat bilaterally. No significant facial droop. Tongue midline. LUE against gravity and without drift, RUE 2+/5 with drift to bed within 10 seconds. LLE 3/5 against gravity,  RLE 1/5 without movement.  Sensation symmetrical bilaterally subjectively, left FTN intact but very slow in action, gait not tested.     ASSESSMENT/PLAN Mr. HAN VEJAR is a 89 y.o. male with history of dementia, CAD status post CABG, diastolic CHF, hypertension, hyperlipidemia, PAF and PE on Eliquis , PUD, lung cancer in remission, BPH with chronic Foley, rheumatoid arthritis, anemia and thrombocytopenia as well as previous ICH in 07/2018 admitted for worsening right-sided weakness and aphasia. No TNK given due  to on Eliquis .  Stroke:  right MCA and left MCA/ACA infarcts embolic likely secondary to A-fib even on Eliquis  CT no acute abnormality CT head and neck right ICA 50% stenosis, left ICA 60% stenosis MRI right MCA and left MCA/ACA watershed infarcts 2D Echo pending LDL 31 HgbA1c 5.7 UDS negative Heparin  IV for VTE prophylaxis Eliquis  2.5 twice daily prior to admission, now on heparin  IV.  Will transition to eliquis  if he is able to eat OK today Ongoing aggressive stroke risk factor management Therapy recommendations:  [facility] PT Disposition: Pending  History of stroke 07/2018 admitted for left thalamic small ICH.  CTA head and neck showed right ICA 50% and left ICA 65% stenosis.  Moderate bilateral siphon left more than right, hypoplastic posterior circulation.  EF 60 to 65%.  Patient has residual right-sided weakness  AF Home Eliquis  2.5 mg twice daily PTA Compliant with medication per family Now on heparin  IV Consider resume Eliquis  if eating OK today  Hypertension Stable Avoid low BP given bilateral ICA stenosis Long term BP goal normotensive  Hyperlipidemia Home meds: Crestor  5 LDL 31, goal < 70 Consider to resume Crestor  if eating OK today Continue statin at discharge  Dysphagia Severe dysarthria Now passed swallow, on dys3 and thin liquid Speech on board Will monitor po intake today  Other Stroke Risk Factors Advanced age Coronary artery disease status post CABG Diastolic CHF History of PE  Other Active Problems Lung cancer in remission BPH on chronic Foley Rheumatoid arthritis POD Thrombocytopenia platelet 100--132  Hospital day # 1   Ary Cummins, MD PhD Stroke Neurology 09/24/2023 12:14 PM    To contact Stroke Continuity provider, please refer to [email or web address]. After hours, contact General Neurology

## 2023-09-24 NOTE — Progress Notes (Signed)
*  PRELIMINARY RESULTS* Echocardiogram 2D Echocardiogram has been performed.  Eva JONETTA Lash 09/24/2023, 2:16 PM

## 2023-09-24 NOTE — Progress Notes (Signed)
 PROGRESS NOTE  Evan [name] FMW:[id number] DOB: 12-18-1927 DOA: 09/22/2023 PCP: [name], MD   LOS: 1 day   Brief Narrative / Interim history:  89 y.o. male with medical history significant of dementia, prior history of CVA, history of lung cancer, BPH, coronary artery disease, rheumatoid arthritis, history of subdural hematoma, GERD, depression with anxiety who was brought in from home by family due to aphasia.  Around 5 PM the day of admission, all of a sudden he was unable to communicate.  Family brought him to the hospital and neurology was consulted.  An MRI of the brain showed small areas of acute ischemia within both superior frontal lobes in the right MCA and left ACA territories.  Subjective / 24h Interval events: He tells me he is thirsty  Assesement and Plan: Principal Problem:   CVA (cerebral vascular accident) (HCC) Active Problems:   Gastro-esophageal reflux disease without esophagitis   Hyperlipidemia, unspecified   S/P CABG (coronary artery bypass graft)   Permanent atrial fibrillation (HCC)   Rheumatoid arthritis, unspecified (HCC)   HFmrEF (heart failure with mildly reduced ejection fraction) (HCC)   Chronic respiratory failure, unsp w hypoxia or hypercapnia (HCC)   Essential hypertension   CKD stage 3b, GFR 30-44 ml/min (HCC) - baseline SCr 1.2-1.5   COPD (chronic obstructive pulmonary disease) (HCC)   Dementia without behavioral disturbance (HCC)  Principal problem Acute CVA -patient was admitted to the hospital with sudden onset expressive aphasia.  MRI of the brain on admission showed acute ischemia within both superior frontal lobes, on the right MCA and left ACA territories, and probable punctate focus of acute ischemia in the left occipital lobe.  Neurology consulted, discussed with Dr. Jerri, appreciate input - Continue stroke workup, lipid panel shows an LDL of 31.  A1c is 5.7.  A 2D echocardiogram is pending still, therapy evaluation recommends home  health PT.  Appears to have persistent symptoms with some expressive aphasia although appears improved - Speech to evaluate this morning, it appears that he is now on dysphagia 3 diet with thin liquids.  Active problems Chronic atrial fibrillation-appears rate controlled, on Eliquis  2.5 mg at home.  Given n.p.o. yesterday, he was placed on heparin  drip.  He is now passed swallow eval, will resume Eliquis   Essential hypertension-allow permissive hypertension for now, he is normotensive  Hypokalemia-replace as indicated and continue to monitor  History of COPD-no wheezing, no exacerbation.  Continue home medication.  Family reports chronic cough  Dementia, depression-continue fluoxetine   Hyperlipidemia-continue statin  BPH-continue tamsulosin   CKD 3A-creatinine 0.9-1.2 at baseline, currently at baseline  CAD, status post CABG -no chest pain, stable, continue statin  Anemia, thrombocytopenia-chronic, stable, no bleeding   Scheduled Meds:  Chlorhexidine  Gluconate Cloth  6 each Topical Daily   ferrous sulfate   325 mg Oral Daily   FLUoxetine   20 mg Oral Daily   fluticasone  furoate-vilanterol  1 puff Inhalation Daily   folic acid   1 mg Oral Daily   multivitamin with minerals  1 tablet Oral Daily   pantoprazole   40 mg Oral q morning   rosuvastatin   5 mg Oral QHS   tamsulosin   0.4 mg Oral QPC supper   Continuous Infusions:  dextrose  5 % and 0.9 % NaCl 50 mL/hr at 09/24/23 0626   heparin  850 Units/hr (09/23/23 2304)   potassium chloride  10 mEq (09/24/23 0936)   PRN Meds:.acetaminophen , albuterol , melatonin, senna-docusate  Current Outpatient Medications  Medication Instructions   acetaminophen  (TYLENOL ) 325-650 mg, 2 times  daily PRN   apixaban  (ELIQUIS ) 2.5 mg, Oral, 2 times daily   budesonide -formoterol  (SYMBICORT ) 160-4.5 MCG/ACT inhaler 2 puffs, Inhalation, 2 times daily   FeroSul 325 mg, Oral, Daily   FLUoxetine  (PROZAC ) 20 mg, Oral, Daily   folic acid  (FOLVITE ) 1 mg,  Oral, Daily   furosemide  (LASIX ) 40 mg, Oral, Daily   hydrOXYzine  (ATARAX ) 10 mg, Oral, Every 8 hours PRN   melatonin 3 mg, Oral, At bedtime PRN   metoprolol  tartrate (LOPRESSOR ) 25 mg, Daily   Multiple Vitamins-Minerals (MENS 50+ MULTIVITAMIN) TABS 1 tablet, Daily   mupirocin  ointment (BACTROBAN ) 2 % 1 Application, Topical, 2 times daily   nitroGLYCERIN  (NITROSTAT ) 0.4 mg, Sublingual, Every 5 min PRN   nystatin  (MYCOSTATIN /NYSTOP ) powder Apply a thin layer topically to the affected area 2-3 (two to three) times daily.   pantoprazole  (PROTONIX ) 40 mg, Oral, Every morning   rosuvastatin  (CRESTOR ) 5 mg, Oral, Daily at bedtime   senna-docusate (STIMULANT LAXATIVE) 8.6-50 MG tablet Take 2 tablets by mouth 2 (two) times daily as needed for constipation   tamsulosin  (FLOMAX ) 0.4 mg, Oral, Daily with supper   Zinc  Oxide (TRIPLE PASTE) 12.8 % ointment Topical, 4 times daily PRN    Diet Orders (From admission, onward)     Start     Ordered   09/24/23 1014  DIET DYS 3 Fluid consistency: Thin  Diet effective now       Question:  Fluid consistency:  Answer:  Thin   09/24/23 1014            DVT prophylaxis:    Lab Results  Component Value Date   PLT 132 (L) 09/24/2023      Code Status: Limited: Do not attempt resuscitation (DNR) -DNR-LIMITED -Do Not Intubate/DNI   Family Communication: Son present at bedside  Status is: Inpatient  Level of care: Telemetry Medical  Consultants:  Neurology  Objective: Vitals:   09/24/23 0510 09/24/23 0745 09/24/23 0746 09/24/23 0802  BP: 129/68 121/74 121/74   Pulse: 91 (!) 112 99   Resp: 19 19 20    Temp: 98.3 F (36.8 C)  98.4 F (36.9 C)   TempSrc: Axillary  Axillary   SpO2: 94% 95% 95% 96%  Weight:      Height:        Intake/Output Summary (Last 24 hours) at 09/24/2023 1026 Last data filed at 09/24/2023 0626 Gross per 24 hour  Intake 946.99 ml  Output 700 ml  Net 246.99 ml   Wt Readings from Last 3 Encounters:  09/23/23 58.5  kg  09/09/23 58.5 kg  09/08/23 58.5 kg    Examination:  Constitutional: NAD Eyes: lids and conjunctivae normal, no scleral icterus ENMT: mmm Neck: normal, supple Respiratory: clear to auscultation bilaterally, no wheezing, no crackles. Normal respiratory effort.  Cardiovascular: Regular rate and rhythm, no murmurs / rubs / gallops. No LE edema. Abdomen: soft, no distention, no tenderness. Bowel sounds positive.   Data Reviewed: I have independently reviewed following labs and imaging studies   CBC Recent Labs  Lab 09/22/23 1957 09/22/23 1959 09/24/23 0545  WBC 6.1  --  7.6  HGB 8.8* 9.2* 9.1*  HCT 27.3* 27.0* 27.8*  PLT 100*  --  132*  MCV 105.4*  --  102.2*  MCH 34.0  --  33.5  MCHC 32.2  --  32.7  RDW 14.9  --  15.4  LYMPHSABS 1.5  --   --   MONOABS 0.4  --   --   EOSABS 0.0  --   --  BASOSABS 0.0  --   --     Recent Labs  Lab 09/22/23 1957 09/22/23 1959 09/22/23 2152 09/24/23 0545  NA 136 137  --  137  K 3.1* 3.0*  --  3.1*  CL 106 103  --  112*  CO2 20*  --   --  22  GLUCOSE 118* 124*  --  123*  BUN 21 20  --  12  CREATININE 1.02 0.90  --  1.03  CALCIUM  8.6*  --   --  8.0*  AST 22  --   --  18  ALT 13  --   --  11  ALKPHOS 86  --   --  83  BILITOT 0.4  --   --  1.2  ALBUMIN 2.7*  --   --  2.6*  MG  --   --  1.7 1.8  INR 1.4*  --   --   --   TSH  --   --  2.215  --   HGBA1C  --   --  5.7*  --     ------------------------------------------------------------------------------------------------------------------ Recent Labs    09/23/23 0447  CHOL 70  HDL 29*  LDLCALC 31  TRIG 49  CHOLHDL 2.4    Lab Results  Component Value Date   HGBA1C 5.7 (H) 09/22/2023   ------------------------------------------------------------------------------------------------------------------ Recent Labs    09/22/23 2152  TSH 2.215    Cardiac Enzymes No results for input(s): CKMB, TROPONINI, MYOGLOBIN in the last 168 hours.  Invalid input(s):  CK ------------------------------------------------------------------------------------------------------------------    Component Value Date/Time   BNP 1,666.8 (H) 09/13/2023 1200   BNP 802 (H) 08/10/2023 1633    CBG: Recent Labs  Lab 09/22/23 1944  GLUCAP 132*    No results found for this or any previous visit (from the past 240 hours).   Radiology Studies: DG CHEST PORT 1 VIEW Result Date: 09/23/2023 CLINICAL DATA:  Wheezing. EXAM: PORTABLE CHEST 1 VIEW COMPARISON:  09/13/2023 FINDINGS: Stable cardiomediastinal contours scratch set status post median sternotomy and CABG procedure. Aortic atherosclerosis. Stable cardiomediastinal contours. Chronic coarsened interstitial markings are noted bilaterally. Blunting of the right costophrenic angle. Right midlung and right base hazy opacities noted which appears similar to 09/10/2023 when there was right lower lobe consolidative change. The visualized osseous structures are unremarkable. IMPRESSION: 1. Right midlung and right base hazy opacities which may reflect residual/persistent pneumonia. 2. Chronic coarsened interstitial markings bilaterally. 3. Blunting of the right costophrenic angle may reflect a small effusion. Electronically Signed   By: Waddell Calk M.D.   On: 09/23/2023 13:50     Nilda Fendt, MD, PhD [facility]  Between 7 am - 7 pm I am available, please contact me via Amion (for emergencies) or Securechat (non urgent messages)  Between 7 pm - 7 am I am not available, please contact night coverage MD/APP via Amion

## 2023-09-24 NOTE — Plan of Care (Signed)
  Problem: Clinical Measurements: Goal: Ability to maintain clinical measurements within normal limits will improve Outcome: Progressing Goal: Will remain free from infection Outcome: Progressing Goal: Diagnostic test results will improve Outcome: Progressing Goal: Respiratory complications will improve Outcome: Progressing Goal: Cardiovascular complication will be avoided Outcome: Progressing   Problem: Activity: Goal: Risk for activity intolerance will decrease Outcome: Progressing   Problem: Elimination: Goal: Will not experience complications related to bowel motility Outcome: Progressing Goal: Will not experience complications related to urinary retention Outcome: Progressing   Problem: Pain Managment: Goal: General experience of comfort will improve and/or be controlled Outcome: Progressing   Problem: Safety: Goal: Ability to remain free from injury will improve Outcome: Progressing   Problem: Skin Integrity: Goal: Risk for impaired skin integrity will decrease Outcome: Progressing

## 2023-09-24 NOTE — Progress Notes (Signed)

## 2023-09-24 NOTE — Progress Notes (Signed)
 Speech Language Pathology Treatment: Dysphagia  Patient Details Name: Evan [name] MRN: [id number] DOB: 11-19-1927 Today's Date: 09/24/2023 Time: 0920-0949 SLP Time Calculation (min) (ACUTE ONLY): 29 min  Assessment / Plan / Recommendation Clinical Impression  Pt seen for dysphagia f/u/objective assessment readiness/need with improvements noted as vocal quality judged to be min dysphonic, but did not exhibit wet vocal quality during trial.  Pt requesting water, so various volumes administered with min-mod verbal cues provided by SLP with delayed cough noted with larger consecutive sips of thin, but eliminated when tsp amounts given.  Educated family members (son/daughter) re: giving tsp amounts with thin and/or ice chips independently (do not give tsp of water/ice chips together d/t decreased coordination of swl) and aspiration/swallowing precautions during meals with family in agreement with compensations and halting po intake if pt begins to exhibit any overt s/s of aspiration.  Pt consumed tsp amounts of thin, puree and soft solids with impaired mastication noted with solids and delay in the initiation of the swallow observed across consistencies (typical for age with presbyphagia).  Pt did exhibit L anterior loss with sips of thin via cup, but this again was eliminated when given tsp amounts.  Pt able to expectorate secretions via oral cavity, so strong cough noted.  Prior MBS indicated potential for nectar-thick liquids d/t pyriform sinus swallow initiation intermittently on assessment 09/10/23.  Pt able to consume thin liquids in tsp amounts without overt s/s of aspiration during this assessment.  Recommend initiating a Dysphagia 3(chopped)/thin liquid diet with liquids provided via tsp.  Small bites/sips and slow rate recommended with upright positioning and esophageal precautions followed d/t esophageal hx.  ST will f/u in acute setting for objective assessment need/diet tolerance/dysphagia  education/tx.    HPI HPI: Evan [name] is a 89 y.o. male with medical history significant of dementia, CAD status post CABG, HFpEF, CVA, hypertension, hyperlipidemia, CKD stage IIIa, COPD, PVD, history of PE on Eliquis , lung cancer, anxiety, BPH with chronic Foley, depression, esophageal ulcer, GERD, hemochromatosis, rheumatoid arthritis, chronic low back pain, chronic anemia and thrombocytopenia. Recent admission 5/11-5/19 for metapneumovirus pneumonia and heart failure exacerbation. Admitted again 5/21-5/22 for altered mental status and right upper extremity/hand cellulitis, chest congestion, SOB. Dx with acute hypoxemic respiratory failure secondary to aspiration PNA. Patient with h/o dysphagia, multiple MBS since 2020. Study in 2020 and 2024 recommending either nectar thick or thin liquids, study complete 08/2023 recommending regular solids with nectar thick liquids due to aspiration of thin liquids. MBS on 09/11/2023 indicate presbyphagia with esophageal stenosis with a reg/thin diet rec. Pt presents to [facility] on 6/20 from home due to aphasia. MRI demonstrated small areas of acute ischemia within both superior frontal lobes; BSE recommended NPO on 6/21 d/t confusion/delayed swallow and wet vocal quality.  ST f/u for dysphagia tx/po readiness/MBS need.      SLP Plan  Goals updated          Recommendations  Diet recommendations: Dysphagia 3 (mechanical soft);Thin liquid Liquids provided via: Teaspoon Medication Administration: Via alternative means (or whole with puree) Supervision: Full supervision/cueing for compensatory strategies;Trained caregiver to feed patient Compensations: Slow rate;Small sips/bites Postural Changes and/or Swallow Maneuvers: Seated upright 90 degrees                  Oral care BID;Staff/trained caregiver to provide oral care     Dysphagia, oropharyngeal phase (R13.12)     Goals updated     Pat Niklas Chretien,M.S.,CCC-SLP  09/24/2023, 10:12 AM

## 2023-09-25 ENCOUNTER — Inpatient Hospital Stay (HOSPITAL_COMMUNITY)

## 2023-09-25 ENCOUNTER — Ambulatory Visit: Admitting: Cardiology

## 2023-09-25 DIAGNOSIS — I480 Paroxysmal atrial fibrillation: Secondary | ICD-10-CM | POA: Diagnosis not present

## 2023-09-25 DIAGNOSIS — I110 Hypertensive heart disease with heart failure: Secondary | ICD-10-CM | POA: Diagnosis not present

## 2023-09-25 DIAGNOSIS — I63413 Cerebral infarction due to embolism of bilateral middle cerebral arteries: Secondary | ICD-10-CM | POA: Diagnosis not present

## 2023-09-25 DIAGNOSIS — I6340 Cerebral infarction due to embolism of unspecified cerebral artery: Secondary | ICD-10-CM | POA: Diagnosis not present

## 2023-09-25 DIAGNOSIS — I63422 Cerebral infarction due to embolism of left anterior cerebral artery: Secondary | ICD-10-CM | POA: Diagnosis not present

## 2023-09-25 MED ORDER — HYDROXYZINE HCL 10 MG PO TABS
10.0000 mg | ORAL_TABLET | Freq: Three times a day (TID) | ORAL | Status: DC | PRN
  Filled 2023-09-25: qty 1

## 2023-09-25 MED ORDER — SODIUM CHLORIDE 0.9 % IV SOLN
3.0000 g | Freq: Three times a day (TID) | INTRAVENOUS | Status: DC
  Administered 2023-09-25 – 2023-09-26 (×3): 3 g via INTRAVENOUS
  Filled 2023-09-25 (×3): qty 8

## 2023-09-25 NOTE — TOC CAGE-AID Note (Signed)
 Transition of Care [facility]) - CAGE-AID Screening   Patient Details  Name: Evan [name] MRN: [id number] Date of Birth: 1927/09/15  Transition of [facility]) CM/SW Contact:    Shawnice Tilmon E Aslin Farinas, LCSW Phone Number: 09/25/2023, 9:21 AM   Clinical Narrative: Disoriented  CAGE-AID Screening: Substance Abuse Screening unable to be completed due to: : Patient unable to participate

## 2023-09-25 NOTE — Progress Notes (Signed)
 SUBJECTIVE (INTERVAL HISTORY) Daughter is at the bedside.  Pt lying in bed, initially sleeping but eyes open on voice, still has severe dysarthria and right hemiplegia. However, passed swallow this morning with dys3 diet and thin liquid. Will monitor his po intake today.      OBJECTIVE Temp:  [98.2 F (36.8 C)-98.5 F (36.9 C)] 98.4 F (36.9 C) (06/22 0746) Pulse Rate:  [91-117] 99 (06/22 0746) Cardiac Rhythm: Atrial fibrillation (06/22 0940) Resp:  [18-20] 20 (06/22 0746) BP: (109-132)/(67-75) 121/74 (06/22 0746) SpO2:  [94 %-96 %] 96 % (06/22 0802) Weight:  [58.5 kg] 58.5 kg (06/21 2300)   Last Labs     Recent Labs  Lab 09/22/23 1944  GLUCAP 132*      Last Labs        Recent Labs  Lab 09/22/23 1957 09/22/23 1959 09/22/23 2152 09/24/23 0545  NA 136 137  --  137  K 3.1* 3.0*  --  3.1*  CL 106 103  --  112*  CO2 20*  --   --  22  GLUCOSE 118* 124*  --  123*  BUN 21 20  --  12  CREATININE 1.02 0.90  --  1.03  CALCIUM  8.6*  --   --  8.0*  MG  --   --  1.7 1.8  PHOS  --   --   --  3.1      Last Labs      Recent Labs  Lab 09/22/23 1957 09/24/23 0545  AST 22 18  ALT 13 11  ALKPHOS 86 83  BILITOT 0.4 1.2  PROT 6.8 6.2*  ALBUMIN 2.7* 2.6*      Last Labs       Recent Labs  Lab 09/22/23 1957 09/22/23 1959 09/24/23 0545  WBC 6.1  --  7.6  NEUTROABS 4.1  --   --   HGB 8.8* 9.2* 9.1*  HCT 27.3* 27.0* 27.8*  MCV 105.4*  --  102.2*  PLT 100*  --  132*      Last Labs  No results for input(s): CKTOTAL, CKMB, CKMBINDEX, TROPONINI in the last 168 hours.   Recent Labs (last 2 labs)     Recent Labs    09/22/23 1957  LABPROT 17.3*  INR 1.4*      Recent Labs (last 2 labs)     Recent Labs    09/22/23 2048  COLORURINE STRAW*  LABSPEC 1.015  PHURINE 7.0  GLUCOSEU NEGATIVE  HGBUR NEGATIVE  BILIRUBINUR NEGATIVE  KETONESUR NEGATIVE  PROTEINUR NEGATIVE  NITRITE NEGATIVE  LEUKOCYTESUR NEGATIVE      Labs (Brief)          Component Value  Date/Time    CHOL 70 09/23/2023 0447    CHOL 91 (L) 06/22/2020 0953    CHOL 97 09/09/2013 0957    TRIG 49 09/23/2023 0447    TRIG 92 09/09/2013 0957    HDL 29 (L) 09/23/2023 0447    HDL 35 (L) 06/22/2020 0953    HDL 37 (L) 09/09/2013 0957    CHOLHDL 2.4 09/23/2023 0447    VLDL 10 09/23/2023 0447    LDLCALC 31 09/23/2023 0447    LDLCALC 41 06/22/2020 0953    LDLCALC 42 09/09/2013 0957      Recent Labs       Lab Results  Component Value Date    HGBA1C 5.7 (H) 09/22/2023      Labs (Brief)          Component  Value Date/Time    LABOPIA NONE DETECTED 09/22/2023 2040    COCAINSCRNUR NONE DETECTED 09/22/2023 2040    LABBENZ NONE DETECTED 09/22/2023 2040    AMPHETMU NONE DETECTED 09/22/2023 2040    THCU NONE DETECTED 09/22/2023 2040    LABBARB NONE DETECTED 09/22/2023 2040      Last Labs     Recent Labs  Lab 09/22/23 1950  ETH <15        I have personally reviewed the radiological images below and agree with the radiology interpretations.    Imaging Results  DG CHEST PORT 1 VIEW Result Date: 09/23/2023 CLINICAL DATA:  Wheezing. EXAM: PORTABLE CHEST 1 VIEW COMPARISON:  09/13/2023 FINDINGS: Stable cardiomediastinal contours scratch set status post median sternotomy and CABG procedure. Aortic atherosclerosis. Stable cardiomediastinal contours. Chronic coarsened interstitial markings are noted bilaterally. Blunting of the right costophrenic angle. Right midlung and right base hazy opacities noted which appears similar to 09/10/2023 when there was right lower lobe consolidative change. The visualized osseous structures are unremarkable. IMPRESSION: 1. Right midlung and right base hazy opacities which may reflect residual/persistent pneumonia. 2. Chronic coarsened interstitial markings bilaterally. 3. Blunting of the right costophrenic angle may reflect a small effusion. Electronically Signed   By: Waddell Calk M.D.   On: 09/23/2023 13:50    EEG adult Result Date:  09/23/2023 Shelton Arlin KIDD, MD     09/23/2023  6:31 AM Patient Name: VALERIA KRISKO MRN: [id number] Epilepsy Attending: Arlin KIDD Shelton Referring Physician/Provider: Jerrie Lola CROME, MD Date: 09/22/2023 Duration: 21.20 mins Patient history:  89 y.o. male with a past medical history of multiple vascular risk factors as documented above, presenting with acute onset aphasia. EEG to evaluate for seizure. Level of alertness: Awake AEDs during EEG study: None Technical aspects: This EEG study was done with scalp electrodes positioned according to the 10-20 International system of electrode placement. Electrical activity was reviewed with band pass filter of 1-70Hz , sensitivity of 7 uV/mm, display speed of 2mm/sec with a 60Hz  notched filter applied as appropriate. EEG data were recorded continuously and digitally stored.  Video monitoring was available and reviewed as appropriate. Description: EEG showed continuous generalized 3 to 6 Hz theta-delta slowing. Hyperventilation and photic stimulation were not performed.   ABNORMALITY - Continuous slow, generalized IMPRESSION: This study is suggestive of moderate diffuse encephalopathy. No seizures or epileptiform discharges were seen throughout the recording. Arlin KIDD Shelton    MR BRAIN WO CONTRAST Result Date: 09/23/2023 CLINICAL DATA:  Delirium EXAM: MRI HEAD WITHOUT CONTRAST TECHNIQUE: Multiplanar, multiecho pulse sequences of the brain and surrounding structures were obtained without intravenous contrast. COMPARISON:  None Available. FINDINGS: Truncated and motion degraded examination due to patient altered mental status and inability to remain still. Only diffusion-weighted imaging and axial T2-weighted FLAIR sequence were performed. There are small areas of acute ischemia within both superior frontal lobes, affecting the right MCA territory and left ACA territory. Probable punctate focus of acute ischemia in the left occipital lobe. There is multifocal hyperintense  T2-weighted signal within the periventricular and deep white matter. IMPRESSION: 1. Truncated and motion degraded examination due to patient altered mental status. 2. Small areas of acute ischemia within both superior frontal lobes, affecting the right MCA territory and left ACA territory. 3. Probable punctate focus of acute ischemia in the left occipital lobe. Electronically Signed   By: Franky Stanford M.D.   On: 09/23/2023 03:40    CT ANGIO HEAD NECK W WO CM (CODE STROKE) Result Date: 09/22/2023 CLINICAL  DATA:  Aphasia EXAM: CT ANGIOGRAPHY HEAD AND NECK WITH AND WITHOUT CONTRAST TECHNIQUE: Multidetector CT imaging of the head and neck was performed using the standard protocol during bolus administration of intravenous contrast. Multiplanar CT image reconstructions and MIPs were obtained to evaluate the vascular anatomy. Carotid stenosis measurements (when applicable) are obtained utilizing NASCET criteria, using the distal internal carotid diameter as the denominator. RADIATION DOSE REDUCTION: This exam was performed according to the departmental dose-optimization program which includes automated exposure control, adjustment of the mA and/or kV according to patient size and/or use of iterative reconstruction technique. CONTRAST:  75mL OMNIPAQUE  IOHEXOL  350 MG/ML SOLN COMPARISON:  07/14/2018 FINDINGS: CTA NECK FINDINGS Skeleton: No acute abnormality or high grade bony spinal canal stenosis. Other neck: Normal pharynx, larynx and major salivary glands. No cervical lymphadenopathy. Unremarkable thyroid  gland. Upper chest: Emphysema and unchanged right paratracheal adenopathy. Aortic arch: There is calcific atherosclerosis of the aortic arch. Conventional 3 vessel aortic branching pattern. RIGHT carotid system: No dissection, occlusion or aneurysm. There is mixed density atherosclerosis extending into the proximal ICA, resulting in 50% stenosis. LEFT carotid system: Bulky, circumferential, mixed density  atherosclerotic disease at the left carotid bifurcation extending into the proximal external and internal carotid arteries. There is 60% proximal left ICA stenosis. Vertebral arteries: Left dominant configuration. There is no dissection, occlusion or flow-limiting stenosis to the skull base (V1-V3 segments). CTA HEAD FINDINGS POSTERIOR CIRCULATION: Mild atherosclerosis of the left vertebral artery V4 segment. Right vertebral artery is diminutive but patent. No proximal occlusion of the anterior or inferior cerebellar arteries. Basilar artery is normal. Superior cerebellar arteries are normal. Posterior cerebral arteries are normal. ANTERIOR CIRCULATION: Atherosclerotic calcification of the internal carotid arteries at the skull base without hemodynamically significant stenosis. Azygous anterior cerebral artery. Middle cerebral arteries are normal. Venous sinuses: As permitted by contrast timing, patent. Anatomic variants: Fetal origins of both posterior cerebral arteries. Review of the MIP images confirms the above findings. IMPRESSION: 1. No emergent large vessel occlusion. 2. Bilateral carotid bifurcation atherosclerosis with 50% stenosis of the proximal right ICA and 60% stenosis of the proximal left ICA. Aortic Atherosclerosis (ICD10-I70.0) and Emphysema (ICD10-J43.9). Electronically Signed   By: Franky Stanford M.D.   On: 09/22/2023 20:36    CT HEAD CODE STROKE WO CONTRAST Result Date: 09/22/2023 CLINICAL DATA:  Code stroke.  Acute neurologic deficit EXAM: CT HEAD WITHOUT CONTRAST TECHNIQUE: Contiguous axial images were obtained from the base of the skull through the vertex without intravenous contrast. RADIATION DOSE REDUCTION: This exam was performed according to the departmental dose-optimization program which includes automated exposure control, adjustment of the mA and/or kV according to patient size and/or use of iterative reconstruction technique. COMPARISON:  04/28/2022 FINDINGS: Brain: There is no  mass, hemorrhage or extra-axial collection. There is generalized atrophy without lobar predilection. There is hypoattenuation of the periventricular white matter, most commonly indicating chronic ischemic microangiopathy. Vascular: Atherosclerotic calcification of the vertebral and internal carotid arteries at the skull base. No abnormal hyperdensity of the major intracranial arteries or dural venous sinuses. Skull: The visualized skull base, calvarium and extracranial soft tissues are normal. Sinuses/Orbits: Mild ethmoid sinus mucosal thickening. The orbits are normal. ASPECTS [facility] Stroke Program Early CT Score) - Ganglionic level infarction (caudate, lentiform nuclei, internal capsule, insula, M1-M3 cortex): 7 - Supraganglionic infarction (M4-M6 cortex): 3 Total score (0-10 with 10 being normal): 10 IMPRESSION: 1. No acute intracranial abnormality. 2. ASPECTS is 10. These results were communicated to Dr. Lola Jernigan at 8:04 pm on 09/22/2023  by text page via the [facility] messaging system. Electronically Signed   By: Franky Stanford M.D.   On: 09/22/2023 20:05    DG CHEST PORT 1 VIEW Result Date: 09/13/2023 CLINICAL DATA:  Sudden onset shortness of breath EXAM: PORTABLE CHEST 1 VIEW COMPARISON:  09/10/2023 FINDINGS: Cardiac shadow is prominent but stable. Postsurgical changes are noted. Aortic calcifications are seen. Slight increase in the degree of interstitial changes are noted consistent with worsening edema. No focal confluent infiltrate or effusion is noted. IMPRESSION: Worsening edema. Electronically Signed   By: Oneil Devonshire M.D.   On: 09/13/2023 00:26    DG Swallowing Func-Speech Pathology Result Date: 09/10/2023 Table formatting from the original result was not included. Modified Barium Swallow Study Patient Details Name: HEYWOOD TOKUNAGA MRN: [id number] Date of Birth: October 12, 1927 Today's Date: 09/10/2023 HPI/PMH: HPI: LAMARKUS NEBEL is a 89 y.o. male with medical history significant of dementia, CAD status  post CABG, HFpEF, CVA, hypertension, hyperlipidemia, CKD stage IIIa, COPD, PVD, history of PE on Eliquis , lung cancer, anxiety, BPH with chronic Foley, depression, esophageal ulcer, GERD, hemochromatosis, rheumatoid arthritis, chronic low back pain, chronic anemia and thrombocytopenia.  Recent admission 5/11-5/19 for metapneumovirus pneumonia and heart failure exacerbation.  Admitted again 5/21-5/22 for altered mental status and right upper extremity/hand cellulitis, chest congestion, SOB. Dx with acute hypoxemic respiratory failure secondary to aspiration PNA. Patient with h/o dysphagia, multiple MBS since 2020. Study in 2020 and 2024 recommending either nectar thick or thin liquids,  study complete 08/2023 recommending regular solids with nectar thick liquids due to aspiration of thin liquids. Clinical Impression: Clinical Impression: Patient presents with an age appropriate, functional oropharyngeal swallow based on today's assessment. Oral phase appropriate with timely mastication and full oral clearance. Swallow initiated at the level of the vallecula with all boluses with the acception of thin liquids which were intermittently initiated at the pyriform sinuses resulting in flash penetration of thin liquids which is considered normal for age. No aspiration observed, even when challenged with large consecutive boluses via straw. Most significant finding was notable stasis througout esophagus, only post intake of previously noted large serial swallows of thin liquid. Liquid did not clear independently over approximately 60 seconds. Given bite of pureed solid which quickly pushed entire contents of esophagus into the stomach. Subseqent single small sips of thin liquid passed quickly. 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 Defer further w/u to MD. Education complete with son who was present for the study. Suspect that this patient, with advanced age, h/o CVA, and fluctuating dysphagia documented for at least 5  years, will continue to have fluctuating swallowing abilities with chronic risk of aspiration, exacerbated by suspected esophageal deficits. Recommending thin liquids at this time. SLP will f/u to reinforce use of precautions. Factors that may increase risk of adverse event in presence of aspiration Noe & Lianne 2021): Factors that may increase risk of adverse event in presence of aspiration Noe & Lianne 2021): Respiratory or GI disease; Limited mobility Recommendations/Plan: Swallowing Evaluation Recommendations Swallowing Evaluation Recommendations Recommendations: PO diet PO Diet Recommendation: Regular; Thin liquids (Level 0) Liquid Administration via: Cup; Straw Medication Administration: Whole meds with puree Supervision: Patient able to self-feed; Full supervision/cueing for swallowing strategies Swallowing strategies  : Slow rate; Small bites/sips (single sips) Postural changes: Stay upright 30-60 min after meals; Position pt fully upright for meals Oral care recommendations: Oral care BID (2x/day) Recommended consults: Consider esophageal assessment Treatment Plan Treatment Plan Treatment recommendations: Therapy as outlined in treatment plan below Follow-up recommendations:  Home health SLP Functional status assessment: Patient has had a recent decline in their functional status and demonstrates the ability to make significant improvements in function in a reasonable and predictable amount of time. Treatment frequency: Min 1x/week Treatment duration: 1 week Interventions: Aspiration precaution training; Compensatory techniques; Patient/family education; Diet toleration management by SLP Recommendations Recommendations for follow up therapy are one component of a multi-disciplinary discharge planning process, led by the attending physician.  Recommendations may be updated based on patient status, additional functional criteria and insurance authorization. Assessment: Orofacial Exam: Orofacial Exam  Oral Cavity: Oral Hygiene: WFL Oral Cavity - Dentition: Adequate natural dentition Orofacial Anatomy: WFL Oral Motor/Sensory Function: Suspected cranial nerve impairment CN VII - Facial: Right motor impairment (baseline) Anatomy: Anatomy: Other (Comment) (what appears to be calcification along posterior pharyhngeal wall, above UES again noted, unchanged from previous studies) Boluses Administered: Boluses Administered Boluses Administered: Thin liquids (Level 0); Mildly thick liquids (Level 2, nectar thick); Puree; Solid  Oral Impairment Domain: Oral Impairment Domain Lip Closure: Escape beyond mid-chin Tongue control during bolus hold: Cohesive bolus between tongue to palatal seal Bolus preparation/mastication: Slow prolonged chewing/mashing with complete recollection Bolus transport/lingual motion: Brisk tongue motion Initiation of pharyngeal swallow : Pyriform sinuses  Pharyngeal Impairment Domain: Pharyngeal Impairment Domain Soft palate elevation: No bolus between soft palate (SP)/pharyngeal wall (PW) Laryngeal elevation: Complete superior movement of thyroid  cartilage with complete approximation of arytenoids to epiglottic petiole Anterior hyoid excursion: Complete anterior movement Epiglottic movement: Complete inversion Laryngeal vestibule closure: Incomplete, narrow column air/contrast in laryngeal vestibule Pharyngeal stripping wave : Present - complete Pharyngeal contraction (A/P view only): N/A Pharyngoesophageal segment opening: Complete distension and complete duration, no obstruction of flow Tongue base retraction: No contrast between tongue base and posterior pharyngeal wall (PPW) Pharyngeal residue: Complete pharyngeal clearance  Esophageal Impairment Domain: Esophageal Impairment Domain Esophageal clearance upright position: Esophageal retention Pill: Pill Consistency administered: Mildly thick liquids (Level 2, nectar thick) Mildly thick liquids (Level 2, nectar thick): WFL Penetration/Aspiration  Scale Score: Penetration/Aspiration Scale Score 1.  Material does not enter airway: Mildly thick liquids (Level 2, nectar thick); Puree; Solid; Pill 2.  Material enters airway, remains ABOVE vocal cords then ejected out: Thin liquids (Level 0) Compensatory Strategies: No data recorded  General Information: Caregiver present: Yes  Diet Prior to this Study: NPO   Temperature : Normal   Respiratory Status: WFL   Supplemental O2: Nasal cannula   History of Recent Intubation: No  Behavior/Cognition: Alert; Cooperative; Pleasant mood; Requires cueing Self-Feeding Abilities: Able to self-feed Baseline vocal quality/speech: Dysphonic Volitional Cough: Able to elicit Volitional Swallow: Able to elicit No data recorded Goal Planning: Prognosis for improved oropharyngeal function: Good No data recorded No data recorded No data recorded Consulted and agree with results and recommendations: Patient; Family member/caregiver Pain: Pain Assessment Pain Assessment: No/denies pain End of Session: Start Time:SLP Start Time (ACUTE ONLY): 0935 Stop Time: SLP Stop Time (ACUTE ONLY): 0955 Time Calculation:SLP Time Calculation (min) (ACUTE ONLY): 20 min Charges: SLP Evaluations $ SLP Speech Visit: 1 Visit SLP Evaluations $BSS Swallow: 1 Procedure $MBS Swallow: 1 Procedure SLP visit diagnosis: SLP Visit Diagnosis: Dysphagia, oropharyngeal phase (R13.12) Past Medical History: Past Medical History: Diagnosis Date  Acute blood loss anemia   Acute bronchitis 05/15/2017  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 07/14/2018  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  Altered mental status 08/21/2018  Anginal chest pain at rest [facility])   Chronic non, controlled on Lorazepam   Anxiety  Arthritis   all over   BPH (benign prostatic hyperplasia)   CAD (coronary artery disease)   Cellulitis of arm, right 06/01/2018  Cellulitis of left axilla   Chronic lower back pain   Complication of  anesthesia   problems making water afterwards  Depression   DJD (degenerative joint disease)   Esophageal ulcer without bleeding   bid ppi indefinitely  Fall 07/16/2018  Family history of adverse reaction to anesthesia   children w/PONV  GERD (gastroesophageal reflux disease)   Haemophilus influenzae septicemia (HCC) 03/12/2023  Headache   couple/week maybe (09/04/2015)  Hemochromatosis   Possible, elevated iron  stores, hook-like osteophytes on hand films, normal LFTs  Hepatitis   yellow jaundice as a baby  History of ASCVD   MULTIVESSEL  Hyperlipidemia   Hypertension   Hypotension   Mild aortic stenosis 06/23/2021  Osteoarthritis   Peripheral vascular disease (HCC)   Pneumonia   several times; got a little now (09/04/2015)  Pulmonary embolism (HCC) 02/02/2018  Rash and nonspecific skin eruption 06/01/2018  Rheumatoid arthritis (HCC)   RSV (respiratory syncytial virus pneumonia) 05/15/2022  Subdural hematoma (HCC)   small after fall 09/2016-plavix  held, neurosurgery consulted.  Asymptomatic.    Thromboembolism (HCC) 02/2018  on Lovenox  lifelong since he failed PO Eliquis  Past Surgical History: Past Surgical History: Procedure Laterality Date  ARM SKIN LESION BIOPSY / EXCISION Left 09/02/2015  CATARACT EXTRACTION W/ INTRAOCULAR LENS  IMPLANT, BILATERAL Bilateral   CHOLECYSTECTOMY OPEN    CORNEAL TRANSPLANT Bilateral   [facility] at [facility]; one at [facility]  CORONARY ANGIOPLASTY WITH STENT PLACEMENT    CORONARY ARTERY BYPASS GRAFT  1999  DESCENDING AORTIC ANEURYSM REPAIR W/ STENT    DE stent ostium into the right radial free graft at OM-, 02-2007  ESOPHAGOGASTRODUODENOSCOPY N/A 11/09/2014  Procedure: ESOPHAGOGASTRODUODENOSCOPY (EGD);  Surgeon: Norleen Hint, MD;  Location: [facility] ENDOSCOPY;  Service: Endoscopy;  Laterality: N/A;  INGUINAL HERNIA REPAIR    JOINT REPLACEMENT    LEFT HEART CATH AND CORS/GRAFTS ANGIOGRAPHY N/A 06/27/2017  Procedure: LEFT HEART CATH AND CORS/GRAFTS ANGIOGRAPHY;  Surgeon: Burnard Debby LABOR, MD;  Location: [facility]  INVASIVE CV LAB;  Service: Cardiovascular;  Laterality: N/A;  NASAL SINUS SURGERY    SHOULDER OPEN ROTATOR CUFF REPAIR Right   TOTAL KNEE ARTHROPLASTY Bilateral  Rea Pass MA, CCC-SLP s Pass Rea Meryl 09/10/2023, 10:32 AM   DG Chest 1 View Result Date: 09/10/2023 CLINICAL DATA:  Shortness of breath. EXAM: CHEST  1 VIEW COMPARISON:  Chest radiograph dated 08/23/2023. FINDINGS: There is diffuse chronic intra coarsening. Right lung base atelectasis/scarring. Pneumonia is not excluded. No large pleural effusion. No pneumothorax. Stable cardiac silhouette. Median sternotomy wires. No acute osseous pathology. IMPRESSION: Right lung base atelectasis/scarring. Pneumonia is not excluded. Electronically Signed   By: Vanetta Chou M.D.   On: 09/10/2023 09:44    CT Chest Wo Contrast Result Date: 09/09/2023 CLINICAL DATA:  Pneumonia, complication suspected, xray done outpt xray - concern for PNA EXAM: CT CHEST WITHOUT CONTRAST TECHNIQUE: Multidetector CT imaging of the chest was performed following the standard protocol without IV contrast. RADIATION DOSE REDUCTION: This exam was performed according to the departmental dose-optimization program which includes automated exposure control, adjustment of the mA and/or kV according to patient size and/or use of iterative reconstruction technique. COMPARISON:  08/23/2023, 03/13/2023 FINDINGS: Cardiovascular: Unenhanced imaging of the heart demonstrates cardiomegaly without pericardial effusion. Prominent left atrial dilatation. Postsurgical changes from prior CABG. Normal caliber of the thoracic aorta. Atherosclerosis of the aorta and coronary vasculature.  Assessment of the vascular lumen cannot be performed without intravenous contrast. Mediastinum/Nodes: Borderline enlarged mediastinal adenopathy, measuring up to 10 mm in the right paratracheal station. Thyroid , trachea, and esophagus are unremarkable. Lungs/Pleura: Diffuse subpleural interlobular septal thickening  consistent with chronic scarring. There is superimposed dense airspace disease within the dependent right lower lobe consistent with aspiration or infection. Trace right pleural effusion. No pneumothorax. Central airways are patent. Upper Abdomen: No acute abnormality. Musculoskeletal: No acute or destructive bony abnormalities. Reconstructed images demonstrate no additional findings. IMPRESSION: 1. Dense right basilar consolidation consistent with infection or aspiration. 2. Trace right pleural effusion. 3. Basilar predominant subpleural scarring and fibrosis. 4.  Aortic Atherosclerosis (ICD10-I70.0). 5. Cardiomegaly, with diffuse coronary artery atherosclerosis. Electronically Signed   By: Ozell Daring M.D.   On: 09/09/2023 19:53         PHYSICAL EXAM   Temp:  [98.2 F (36.8 C)-98.5 F (36.9 C)] 98.4 F (36.9 C) (06/22 0746) Pulse Rate:  [91-117] 99 (06/22 0746) Resp:  [18-20] 20 (06/22 0746) BP: (109-132)/(67-75) 121/74 (06/22 0746) SpO2:  [94 %-96 %] 96 % (06/22 0802) Weight:  [58.5 kg] 58.5 kg (06/21 2300)   General - thin built with malnourished, well developed, in no apparent distress.   Ophthalmologic - fundi not visualized due to noncooperation.   Cardiovascular - irregularly irregular heart rate and rhythm.   Neuro - awake, alert, eyes open, hard of hearing, with family assistant, patient was able to orientated to age and place, but not to time.  Severe dysarthria, hard to comprehend with intermittent intangible words.  No gaze palsy, tracking bilaterally, not consistently blinking to visual threat bilaterally. No significant facial droop. Tongue midline. LUE against gravity and without drift, RUE 2+/5 with drift to bed within 10 seconds. LLE 3/5 against gravity,  RLE 1/5 without movement.  Sensation symmetrical bilaterally subjectively, left FTN intact but very slow in action, gait not tested.        ASSESSMENT/PLAN Mr. JOAKIM HUESMAN is a 89 y.o. male with history of dementia,  CAD status post CABG, diastolic CHF, hypertension, hyperlipidemia, PAF and PE on Eliquis , PUD, lung cancer in remission, BPH with chronic Foley, rheumatoid arthritis, anemia and thrombocytopenia as well as previous ICH in 07/2018 admitted for worsening right-sided weakness and aphasia. No TNK given due to on Eliquis .   Stroke:  right MCA and left MCA/ACA infarcts embolic likely secondary to A-fib even on Eliquis  CT no acute abnormality CT head and neck right ICA 50% stenosis, left ICA 60% stenosis MRI right MCA and left MCA/ACA watershed infarcts 2D Echo pending LDL 31 HgbA1c 5.7 UDS negative Heparin  IV for VTE prophylaxis Eliquis  2.5 twice daily prior to admission, now on heparin  IV.  Will transition to eliquis  if he is able to eat OK today Ongoing aggressive stroke risk factor management Therapy recommendations:  HH PT vs. CIR Disposition: Pending   History of stroke 07/2018 admitted for left thalamic small ICH.  CTA head and neck showed right ICA 50% and left ICA 65% stenosis.  Moderate bilateral siphon left more than right, hypoplastic posterior circulation.  EF 60 to 65%.  Patient has residual right-sided weakness   AF Home Eliquis  2.5 mg twice daily PTA Compliant with medication per family Now on heparin  IV Consider resume Eliquis  if eating OK today   Hypertension Stable Avoid low BP given bilateral ICA stenosis Long term BP goal normotensive   Hyperlipidemia Home meds: Crestor  5 LDL 31, goal < 70 Consider to resume Crestor   if eating OK today Continue statin at discharge   Dysphagia Severe dysarthria Now passed swallow, on dys3 and thin liquid Speech on board Will monitor po intake today   Other Stroke Risk Factors Advanced age Coronary artery disease status post CABG Diastolic CHF History of PE   Other Active Problems Lung cancer in remission BPH on chronic Foley Rheumatoid arthritis POD Thrombocytopenia platelet 100--132   Hospital day # 1     Ary Cummins, MD PhD Stroke Neurology 09/24/2023 12:14 PM

## 2023-09-25 NOTE — Progress Notes (Signed)
 PROGRESS NOTE  Evan [name] FMW:[id number] DOB: 06-30-1927 DOA: 09/22/2023 PCP: [name], MD   LOS: 2 days   Brief Narrative / Interim history:  89 y.o. male with medical history significant of dementia, prior history of CVA, history of lung cancer, BPH, coronary artery disease, rheumatoid arthritis, history of subdural hematoma, GERD, depression with anxiety who was brought in from home by family due to aphasia.  Around 5 PM the day of admission, all of a sudden he was unable to communicate.  Family brought him to the hospital and neurology was consulted.  An MRI of the brain showed small areas of acute ischemia within both superior frontal lobes in the right MCA and left ACA territories.  Subjective / 24h Interval events: Patient has no complaints. Son is at bedside, upset that he didn't get the swallow eval that was promised to him since Saturday by SLP (per son).  Assesement and Plan: Principal Problem:   CVA (cerebral vascular accident) (HCC) Active Problems:   Gastro-esophageal reflux disease without esophagitis   Hyperlipidemia, unspecified   S/P CABG (coronary artery bypass graft)   Permanent atrial fibrillation (HCC)   Rheumatoid arthritis, unspecified (HCC)   HFmrEF (heart failure with mildly reduced ejection fraction) (HCC)   Chronic respiratory failure, unsp w hypoxia or hypercapnia (HCC)   Essential hypertension   CKD stage 3b, GFR 30-44 ml/min (HCC) - baseline SCr 1.2-1.5   COPD (chronic obstructive pulmonary disease) (HCC)   Dementia without behavioral disturbance (HCC)  Principal problem Acute CVA -patient was admitted to the hospital with sudden onset expressive aphasia.  MRI of the brain on admission showed acute ischemia within both superior frontal lobes, on the right MCA and left ACA territories, and probable punctate focus of acute ischemia in the left occipital lobe.  Neurology consulted, discussed with Dr. Jerri, appreciate input - Continue stroke workup,  lipid panel shows an LDL of 31.  A1c is 5.7.  A 2D echocardiogram is pending still, therapy evaluation recommends home health PT.  Appears to have persistent symptoms with some expressive aphasia although appears improved - Speech to evaluate this morning, it appears that he is now on dysphagia 3 diet with thin liquids. Some coughing intermittently, obtain MBS. Low threshold for antibiotics given high risk of aspiration, but he is now afebrile and WBC is normal   Active problems Chronic atrial fibrillation-appears rate controlled, on Eliquis  2.5 mg at home. Back on Eliquis   Essential hypertension-allow permissive hypertension for now, he remains normotensive  Hypokalemia-replace as indicated and continue to monitor  History of COPD-no wheezing, no exacerbation.  Continue home medication.  Family reports chronic cough  Dementia, depression-continue fluoxetine   Hyperlipidemia-continue statin  BPH-continue tamsulosin   CKD 3A-creatinine 0.9-1.2 at baseline, currently at baseline  CAD, status post CABG -no chest pain, stable, continue statin  Anemia, thrombocytopenia-chronic, stable, no bleeding   Scheduled Meds:  apixaban   2.5 mg Oral BID   Chlorhexidine  Gluconate Cloth  6 each Topical Daily   ferrous sulfate   325 mg Oral Daily   FLUoxetine   20 mg Oral Daily   fluticasone  furoate-vilanterol  1 puff Inhalation Daily   folic acid   1 mg Oral Daily   multivitamin with minerals  1 tablet Oral Daily   pantoprazole   40 mg Oral q morning   rosuvastatin   5 mg Oral QHS   tamsulosin   0.4 mg Oral QPC supper   Continuous Infusions:   PRN Meds:.acetaminophen , albuterol , guaiFENesin , hydrOXYzine , melatonin, senna-docusate  Current Outpatient Medications  Medication Instructions   acetaminophen  (TYLENOL ) 325-650 mg, 2 times daily PRN   apixaban  (ELIQUIS ) 2.5 mg, Oral, 2 times daily   budesonide -formoterol  (SYMBICORT ) 160-4.5 MCG/ACT inhaler 2 puffs, Inhalation, 2 times daily   FeroSul 325  mg, Oral, Daily   FLUoxetine  (PROZAC ) 20 mg, Oral, Daily   folic acid  (FOLVITE ) 1 mg, Oral, Daily   furosemide  (LASIX ) 40 mg, Oral, Daily   hydrOXYzine  (ATARAX ) 10 mg, Oral, Every 8 hours PRN   melatonin 3 mg, Oral, At bedtime PRN   metoprolol  tartrate (LOPRESSOR ) 25 mg, Daily   Multiple Vitamins-Minerals (MENS 50+ MULTIVITAMIN) TABS 1 tablet, Daily   mupirocin  ointment (BACTROBAN ) 2 % 1 Application, Topical, 2 times daily   nitroGLYCERIN  (NITROSTAT ) 0.4 mg, Sublingual, Every 5 min PRN   nystatin  (MYCOSTATIN /NYSTOP ) powder Apply a thin layer topically to the affected area 2-3 (two to three) times daily.   pantoprazole  (PROTONIX ) 40 mg, Oral, Every morning   rosuvastatin  (CRESTOR ) 5 mg, Oral, Daily at bedtime   senna-docusate (STIMULANT LAXATIVE) 8.6-50 MG tablet Take 2 tablets by mouth 2 (two) times daily as needed for constipation   tamsulosin  (FLOMAX ) 0.4 mg, Oral, Daily with supper   Zinc  Oxide (TRIPLE PASTE) 12.8 % ointment Topical, 4 times daily PRN    Diet Orders (From admission, onward)     Start     Ordered   09/24/23 1208  DIET DYS 3 Room service appropriate? No; Fluid consistency: Thin  Diet effective now       Question Answer Comment  Room service appropriate? No   Fluid consistency: Thin      09/24/23 1208            DVT prophylaxis: apixaban  (ELIQUIS ) tablet 2.5 mg Start: 09/24/23 1145 apixaban  (ELIQUIS ) tablet 2.5 mg   Lab Results  Component Value Date   PLT 132 (L) 09/24/2023      Code Status: Limited: Do not attempt resuscitation (DNR) -DNR-LIMITED -Do Not Intubate/DNI   Family Communication: Son present at bedside  Status is: Inpatient  Level of care: Telemetry Medical  Consultants:  Neurology  Objective: Vitals:   09/25/23 0432 09/25/23 0755 09/25/23 0854 09/25/23 1222  BP: 134/80 126/87  137/79  Pulse: 94 (!) 109  (!) 106  Resp:  19  19  Temp: 97.9 F (36.6 C) 98.3 F (36.8 C)    TempSrc: Oral     SpO2: 97% 99% 95% 100%  Weight:       Height:        Intake/Output Summary (Last 24 hours) at 09/25/2023 1338 Last data filed at 09/24/2023 1700 Gross per 24 hour  Intake 870.03 ml  Output 300 ml  Net 570.03 ml   Wt Readings from Last 3 Encounters:  09/23/23 58.5 kg  09/09/23 58.5 kg  09/08/23 58.5 kg    Examination:  Constitutional: NAD Eyes: lids and conjunctivae normal, no scleral icterus ENMT: mmm Neck: normal, supple Respiratory: clear to auscultation bilaterally, no wheezing, no crackles. Normal respiratory effort.  Cardiovascular: Regular rate and rhythm, no murmurs / rubs / gallops. No LE edema. Abdomen: soft, no distention, no tenderness. Bowel sounds positive.   Data Reviewed: I have independently reviewed following labs and imaging studies   CBC Recent Labs  Lab 09/22/23 1957 09/22/23 1959 09/24/23 0545  WBC 6.1  --  7.6  HGB 8.8* 9.2* 9.1*  HCT 27.3* 27.0* 27.8*  PLT 100*  --  132*  MCV 105.4*  --  102.2*  MCH 34.0  --  33.5  MCHC 32.2  --  32.7  RDW 14.9  --  15.4  LYMPHSABS 1.5  --   --   MONOABS 0.4  --   --   EOSABS 0.0  --   --   BASOSABS 0.0  --   --     Recent Labs  Lab 09/22/23 1957 09/22/23 1959 09/22/23 2152 09/24/23 0545  NA 136 137  --  137  K 3.1* 3.0*  --  3.1*  CL 106 103  --  112*  CO2 20*  --   --  22  GLUCOSE 118* 124*  --  123*  BUN 21 20  --  12  CREATININE 1.02 0.90  --  1.03  CALCIUM  8.6*  --   --  8.0*  AST 22  --   --  18  ALT 13  --   --  11  ALKPHOS 86  --   --  83  BILITOT 0.4  --   --  1.2  ALBUMIN 2.7*  --   --  2.6*  MG  --   --  1.7 1.8  INR 1.4*  --   --   --   TSH  --   --  2.215  --   HGBA1C  --   --  5.7*  --     ------------------------------------------------------------------------------------------------------------------ Recent Labs    09/23/23 0447  CHOL 70  HDL 29*  LDLCALC 31  TRIG 49  CHOLHDL 2.4    Lab Results  Component Value Date   HGBA1C 5.7 (H) 09/22/2023    ------------------------------------------------------------------------------------------------------------------ Recent Labs    09/22/23 2152  TSH 2.215    Cardiac Enzymes No results for input(s): CKMB, TROPONINI, MYOGLOBIN in the last 168 hours.  Invalid input(s): CK ------------------------------------------------------------------------------------------------------------------    Component Value Date/Time   BNP 1,666.8 (H) 09/13/2023 1200   BNP 802 (H) 08/10/2023 1633    CBG: Recent Labs  Lab 09/22/23 1944  GLUCAP 132*    No results found for this or any previous visit (from the past 240 hours).   Radiology Studies: ECHOCARDIOGRAM COMPLETE Result Date: 09/24/2023    ECHOCARDIOGRAM REPORT   Patient Name:   Evan [name] Date of Exam: 09/24/2023 Medical Rec #:  [id number]    Height:       62.0 in Accession #:    [id number]   Weight:       129.0 lb Date of Birth:  [birth year]    BSA:          1.586 m Patient Age:    89 years     BP:           121/74 mmHg Patient Gender: M            HR:           95 bpm. Exam Location:  Inpatient Procedure: 2D Echo, Cardiac Doppler and Color Doppler (Both Spectral and Color            Flow Doppler were utilized during procedure). Indications:    Stroke  History:        Patient has prior history of Echocardiogram examinations, most                 recent 03/10/2023.  Sonographer:    Eva Lash Referring Phys: [id number] JINDONG XU IMPRESSIONS  1. Left ventricular ejection fraction, by estimation, is 60 to 65%. The left ventricle has normal function. The left ventricle has no regional wall motion abnormalities.  There is moderate left ventricular hypertrophy. Left ventricular diastolic function  could not be evaluated.  2. Right ventricular systolic function is mildly reduced. The right ventricular size is mildly enlarged.  3. Left atrial size was severely dilated.  4. Right atrial size was moderately dilated.  5. The mitral valve is degenerative.  Mild mitral valve regurgitation.  6. The tricuspid valve is abnormal. Tricuspid valve regurgitation is moderate.  7. The aortic valve is tricuspid. Aortic valve regurgitation is not visualized. Mild to moderate aortic valve stenosis. Aortic valve area, by VTI measures 1.43 cm. Aortic valve mean gradient measures 6.0 mmHg. Aortic valve Vmax measures 1.85 m/s. Peak gradien 13.7 mmHg, DI 0.34.  8. Aortic dilatation noted. There is mild dilatation of the aortic root, measuring 42 mm.  9. Rhythm strip during this exam demonstrates atrial fibrillation. Comparison(s): Changes from prior study are noted. 03/10/2023: LVEF 60-65%, RVSP 54.8 mmHg, severe LAE, moderate RAE. FINDINGS  Left Ventricle: Left ventricular ejection fraction, by estimation, is 60 to 65%. The left ventricle has normal function. The left ventricle has no regional wall motion abnormalities. The left ventricular internal cavity size was normal in size. There is  moderate left ventricular hypertrophy. Left ventricular diastolic function could not be evaluated due to atrial fibrillation. Left ventricular diastolic function could not be evaluated. Right Ventricle: The right ventricular size is mildly enlarged. No increase in right ventricular wall thickness. Right ventricular systolic function is mildly reduced. Left Atrium: Left atrial size was severely dilated. Right Atrium: Right atrial size was moderately dilated. Pericardium: There is no evidence of pericardial effusion. Mitral Valve: The mitral valve is degenerative in appearance. There is mild calcification of the anterior and posterior mitral valve leaflet(s). Mild mitral valve regurgitation. Tricuspid Valve: The tricuspid valve is abnormal. Tricuspid valve regurgitation is moderate. Aortic Valve: The aortic valve is tricuspid. Aortic valve regurgitation is not visualized. Mild to moderate aortic stenosis is present. Aortic valve mean gradient measures 6.0 mmHg. Aortic valve peak gradient measures  13.7 mmHg. Aortic valve area, by VTI  measures 1.43 cm. Pulmonic Valve: The pulmonic valve was not well visualized. Pulmonic valve regurgitation is not visualized. Aorta: Aortic dilatation noted. There is mild dilatation of the aortic root, measuring 42 mm. Venous: The inferior vena cava was not well visualized. IAS/Shunts: The interatrial septum was not well visualized. EKG: Rhythm strip during this exam demonstrates atrial fibrillation.  LEFT VENTRICLE PLAX 2D LVIDd:         2.80 cm LVIDs:         1.60 cm LV PW:         1.30 cm LV IVS:        1.40 cm LVOT diam:     2.30 cm LV SV:         44 LV SV Index:   28 LVOT Area:     4.15 cm  LV Volumes (MOD) LV vol d, MOD A4C: 58.9 ml LV vol s, MOD A4C: 22.2 ml LV SV MOD A4C:     58.9 ml RIGHT VENTRICLE RV S prime:     4.35 cm/s TAPSE (M-mode): 1.2 cm LEFT ATRIUM              Index LA diam:        6.20 cm  3.91 cm/m LA Vol (A2C):   89.7 ml  56.54 ml/m LA Vol (A4C):   130.0 ml 81.94 ml/m LA Biplane Vol: 108.0 ml 68.08 ml/m  AORTIC VALVE AV Area (Vmax):  1.28 cm AV Area (Vmean):   1.37 cm AV Area (VTI):     1.43 cm AV Vmax:           185.00 cm/s AV Vmean:          112.000 cm/s AV VTI:            0.311 m AV Peak Grad:      13.7 mmHg AV Mean Grad:      6.0 mmHg LVOT Vmax:         57.00 cm/s LVOT Vmean:        36.800 cm/s LVOT VTI:          0.107 m LVOT/AV VTI ratio: 0.34  AORTA Ao Asc diam: 3.30 cm  SHUNTS Systemic VTI:  0.11 m Systemic Diam: 2.30 cm Vinie Maxcy MD Electronically signed by Vinie Maxcy MD Signature Date/Time: 09/24/2023/3:41:35 PM    Final      Nilda Fendt, MD, PhD [facility]  Between 7 am - 7 pm I am available, please contact me via Amion (for emergencies) or Securechat (non urgent messages)  Between 7 pm - 7 am I am not available, please contact night coverage MD/APP via Amion

## 2023-09-25 NOTE — Progress Notes (Signed)
 SATURATION QUALIFICATIONS: (This note is used to comply with regulatory documentation for home oxygen )  Patient Saturations on Room Air at Rest = 98%  Patient Saturations on Room Air while Pivoting OOB to chair = 87%  Patient Saturations on 2 Liters of oxygen  while sitting in chair = 95%  Please briefly explain why patient needs home oxygen : Pt hypoxic on RA with exertional tasks. Pt needing 2L/min O2  to maintain SpO2 WFL while sitting up in chair.

## 2023-09-25 NOTE — Progress Notes (Addendum)
 Speech Language Pathology Treatment:    Patient Details Name: Evan [name] MRN: [id number] DOB: [birth year] Today's Date: 09/25/2023 Time: 8752-8699 SLP Time Calculation (min) (ACUTE ONLY):    Called back to room by MD that pt got choked using Provale cup. Daughter present who reported he got very strangled/ SLP observed pt and he did not cough but tends to extend his head placing at higher risk. Observed with regular cup taking small sips and he did not cough. Recommend not using Provale cup and have pt drink from regular cup, small sips. Educated daughter that he is at risk with all strategies and will likely cough some when he drinks inconsistently given hx of chronic dysphagia. No straws.                         Dustin Olam Bull  09/25/2023, 3:14 PM

## 2023-09-25 NOTE — Progress Notes (Signed)
 Inpatient Rehab Admissions Coordinator:   Received consult and chart reviewed. Patient was requiring assistance at baseline for ADLs and transfers to wheelchair. Unsure of patient's ability to significantly progress or tolerate CIR level of rehab. Patient/family should pursue other discharge settings at this time.     Rehab Admissons Coordinator Abdou Stocks, [place], [place] [phone]'

## 2023-09-25 NOTE — TOC Initial Note (Signed)
 Transition of Care [facility]) - Initial/Assessment Note    Patient Details  Name: Evan [name] MRN: [id number] Date of Birth: 1927/04/29  Transition of Care [facility]) CM/SW Contact:    Andrez JULIANNA George, RN Phone Number: 09/25/2023, 2:53 PM  Clinical Narrative:                  Pt is from home with family. Plan is for him to return home tomorrow with resumption of HH services through [facility]. Cory with [facility] aware of resumption needs.  Daughter states they have needed DME at the bedside except oxygen . Bedside RN to do pulmonary sats check to see if pt qualifies.  Daughter states the family will transport him home tomorrow.  TOC following.  Expected Discharge Plan: Home w Home Health Services Barriers to Discharge: Continued Medical Work up   Patient Goals and CMS Choice   CMS Medicare.gov Compare Post Acute Care list provided to:: Patient Represenative (must comment) Choice offered to / list presented to : Adult Children      Expected Discharge Plan and Services   Discharge Planning Services: CM Consult Post Acute Care Choice: Home Health Living arrangements for the past 2 months: Single Family Home                           HH Arranged: RN, PT, OT, Nurse's Aide, Speech Therapy HH Agency: [facility] Date [facility] Agency Contacted: 09/25/23   Representative spoke with at [facility] Agency: Darleene  Prior Living Arrangements/Services Living arrangements for the past 2 months: Single Family Home Lives with:: Adult Children Patient language and need for interpreter reviewed:: Yes Do you feel safe going back to the place where you live?: Yes        Care giver support system in place?: Yes (comment) Current home services: DME (all needed) Criminal Activity/Legal Involvement Pertinent to Current Situation/Hospitalization: No - Comment as needed  Activities of Daily Living      Permission Sought/Granted                  Emotional Assessment Appearance:: Appears stated  age     Orientation: : Oriented to Self, Oriented to Place   Psych Involvement: No (comment)  Admission diagnosis:  CVA (cerebral vascular accident) (HCC) [I63.9] Expressive aphasia [R47.01] Patient Active Problem List   Diagnosis Date Noted   CVA (cerebral vascular accident) (HCC) 09/22/2023   Sepsis (HCC) 09/10/2023   Aspiration pneumonia (HCC) 09/09/2023   Hypovolemia 08/24/2023   Cellulitis of upper extremity 08/23/2023   Bilateral pneumonia 08/17/2023   Acute hypoxemic respiratory failure (HCC) 08/13/2023   Chronic systolic heart failure (HCC) 08/10/2023   Acute metabolic acidosis 03/13/2023   Chronic indwelling Foley catheter 03/12/2023   Anemia 09/27/2022   Gastro-esophageal reflux disease without esophagitis 05/17/2022   Hyperlipidemia, unspecified 05/17/2022   Personal history of other venous thrombosis and embolism 05/17/2022   CKD stage 3b, GFR 30-44 ml/min (HCC) - baseline SCr 1.2-1.5 05/17/2022   Presence of coronary angioplasty implant and graft 05/17/2022   Malignant neoplasm of unspecified part of right bronchus or lung (HCC) 05/17/2022   Dementia without behavioral disturbance (HCC) 05/17/2022   Acute on chronic diastolic CHF (congestive heart failure) (HCC) 05/12/2022   Protein-calorie malnutrition, severe 05/12/2022   Chronic respiratory failure, unsp w hypoxia or hypercapnia (HCC) 04/28/2022   History of pulmonary embolism 04/28/2022   Hemiparesis due to old stroke (HCC) 06/25/2021   Mild aortic stenosis  06/23/2021   Sundowning 07/04/2019   Bilateral hearing loss 04/16/2019   Closed wedge fracture of lumbar vertebra (HCC) 03/14/2019   Pressure ulcer of back 08/27/2018   COPD (chronic obstructive pulmonary disease) (HCC) 08/26/2018   Prolonged Q-T interval on ECG    Dysphagia, post-stroke    Hyponatremia    History of lung cancer    Depression    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   Thrombocytopenia (HCC)  02/02/2018   Hypokalemia 02/02/2018   Old MI (myocardial infarction) 06/27/2017   HFmrEF (heart failure with mildly reduced ejection fraction) (HCC) 06/26/2017   Oral candidiasis 05/15/2017   Radiation pneumonitis (HCC) 11/09/2016   Rheumatoid arthritis, unspecified (HCC) 10/11/2016   Subdural hematoma (HCC) 09/13/2016   Permanent atrial fibrillation (HCC) 05/21/2016   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   GAD (generalized anxiety disorder) 11/20/2015   Esophageal ulcer without bleeding    Bilateral lower extremity edema 04/28/2014   CAD (coronary artery disease) 09/09/2013   Benign prostatic hyperplasia without lower urinary tract symptoms 10/08/2012   EKG abnormalities 09/05/2012   Tremor 09/05/2012   Chronic fatigue 09/05/2012   Arthritis    Asthma, chronic    Reflux esophagitis    S/P CABG (coronary artery bypass graft)    Essential hypertension    UNSPECIFIED PERIPHERAL VASCULAR DISEASE 06/16/2009   PCP:  [name], MD Pharmacy:   [facility] - [place], [place] - [address] [place] [address] Phone: [phone] Fax: [phone]  [facility] [address] [place] [address] Phone: [phone] Fax: [phone]  [id number] - [place], [place] - [address] AT [facility] [address]  [place] [address] Phone: [phone] Fax: [phone]  [name] [address] [place] [address] Phone: [phone] Fax: [phone]     Social Drivers of Health (SDOH) Social History: SDOH Screenings   Food Insecurity: No Food Insecurity (09/23/2023)  Housing: Low Risk  (09/23/2023)  Transportation Needs: No Transportation Needs (09/23/2023)  Utilities: Not At Risk (09/23/2023)  Alcohol  Screen: Low Risk  (04/20/2023)  Depression (PHQ2-9): High Risk (07/10/2023)  Financial Resource Strain: Low Risk   (04/20/2023)  Physical Activity: Insufficiently Active (04/20/2023)  Social Connections: Socially Isolated (09/23/2023)  Stress: No Stress Concern Present (04/20/2023)  Tobacco Use: Medium Risk (09/23/2023)  Health Literacy: Inadequate Health Literacy (04/20/2023)   SDOH Interventions:     Readmission Risk Interventions    08/15/2023    4:46 PM 03/10/2023    2:24 PM 05/17/2022    1:25 PM  Readmission Risk Prevention Plan  Transportation Screening Complete Complete Complete  PCP or Specialist Appt within 3-5 Days Complete    HRI or Home Care Consult Complete    Palliative Care Screening Not Applicable    Medication Review (RN Care Manager) Complete Complete Complete  PCP or Specialist appointment within 3-5 days of discharge  Complete Complete  HRI or Home Care Consult  Complete Complete  SW Recovery Care/Counseling Consult  Complete   Palliative Care Screening  Not Applicable   Skilled Nursing Facility  Not Applicable

## 2023-09-25 NOTE — Progress Notes (Signed)
 Physical Therapy Treatment Patient Details Name: Evan [name] MRN: [id number] DOB: 05/29/1927 Today's Date: 09/25/2023   History of Present Illness 89 yo male presents to [facility] on 6/20 from home due to aphasia. MRI demonstrated small areas of acute ischemia within both superior frontal lobes.  Recent admissions to [facility] from 5/11-19 for metapneumovirus pneumonia and heart failure exacerbation; 5/21-5/22 for AMS and R hand cellulitis. And 6/7 for SOB with aspiration Pneumonia. PMH arthritis, bronchitis, L thalamic hemorrhage (2020), BPH, Cellulitis RUE, CAD, LBP, DJD, PE, RA, bilat TKA.    PT Comments  Pt received in supine, agreeable to therapy session and daughter present and encouraging and able to assist as +2 as she helps him typically at home. Pt and daughter given teachback on safe lifting technique via +2 HHA and how to block his R knee PRN with standing. Pt having difficulty maintaining full trunk extension in standing and has elevated HR with activity, max 119 bpm observed with pivot to chair. SpO2 desat to 87% on RA with exertional task/once in chair, improved to [facility] on 2L O2 [place], RN notified. Pt needing up to totalA +2 for stand pivot to recliner, typically he only needs +1 assist to transfer, and would benefit from hoyer lift for home for pt/caregiver safety, discussed with pt daughter who is his caregiver as well as supervising PT Caroline B. Pt continues to benefit from PT services to progress toward functional mobility goals, continue to recommend HHPT as pt/family not interested in post-acute therapies, although he would benefit from <3 hours per day if pt does not have consistent +2 lift assist for home.    If plan is discharge home, recommend the following: A lot of help with walking and/or transfers;A lot of help with bathing/dressing/bathroom;Assist for transportation;Help with stairs or ramp for entrance   Can travel by private vehicle        Equipment Recommendations  Deitra lift  (otherwise well equipped)    Recommendations for Other Services       Precautions / Restrictions Precautions Precautions: Fall Recall of Precautions/Restrictions: Impaired Precaution/Restrictions Comments: R pushing, decreased R HB awareness/neglect Required Braces or Orthoses: Other Brace Other Brace: R AFO in room Restrictions Weight Bearing Restrictions Per Provider Order: No     Mobility  Bed Mobility Overal bed mobility: Needs Assistance Bed Mobility: Supine to Sit     Supine to sit: Max assist, +2 for safety/equipment     General bed mobility comments: Cues for task initiation and sequencing, assist for R sided management and trunk to upright.Tried to have pt use RLE hooked over LLE to assist but did not seem to help much. Bed pad assist to achieve upright; daughter present and assisting for safety to support trunk once EOB due to pt posterior lean.    Transfers Overall transfer level: Needs assistance Equipment used: 2 person hand held assist Transfers: Sit to/from Stand, Bed to chair/wheelchair/BSC Sit to Stand: +2 physical assistance, Max assist Stand pivot transfers: Max assist, Total assist, +2 physical assistance         General transfer comment: +2 HHA from PTA and pt daughter who helps him at home, plus gait belt support. Pt with small amount of dried dark stool on bottom upon standing, RN arrived and able to assist with hygiene on second STS trial, then pt pivoted OOB to chair with +2 maxA progressing to +2 TotalA. RN notified pt would benefit from [facility] vs hoyer for return transfer, RN defer to night shift team on  which device they will use. Suggestions written on board as well.    Ambulation/Gait               General Gait Details: Pt unable to weight shift in stance   Stairs             Wheelchair Mobility     Tilt Bed    Modified Rankin (Stroke Patients Only) Modified Rankin (Stroke Patients Only) Pre-Morbid Rankin Score: Severe  disability Modified Rankin: Severe disability     Balance Overall balance assessment: Needs assistance Sitting-balance support: Feet supported Sitting balance-[name] Scale: Poor Sitting balance - Comments: Right lateral and posterior lean, able to correct for brief periods with tactile and verbal cueing, needs constant +1 assist to prevent posterior LOB onto his back Postural control: Posterior lean, Right lateral lean Standing balance support: During functional activity Standing balance-[name] Scale: Zero Standing balance comment: +2 constant assist and R knee blocked                            Communication Communication Communication: Impaired Factors Affecting Communication: Hearing impaired;Other (comment);Reduced clarity of speech;Difficulty expressing self (hypophonic, wears hearing aides)  Cognition Arousal: Alert Behavior During Therapy: Flat affect   PT - Cognitive impairments: History of cognitive impairments                       PT - Cognition Comments: cooperative, decreased awareness of balance deficits Following commands: Impaired Following commands impaired: Follows one step commands inconsistently, Follows one step commands with increased time    Cueing Cueing Techniques: Verbal cues, Tactile cues, Visual cues, Gestural cues  Exercises      General Comments General comments (skin integrity, edema, etc.): SpO2 hypoxic on RA after pivot to chair, pt placed back on 2L O2 [place] and RN notified. HR to ~115 bpm with exertional tasks.      Pertinent Vitals/Pain Pain Assessment Pain Assessment: No/denies pain Pain Intervention(s): Monitored during session, Limited activity within patient's tolerance, Repositioned    Home Living                          Prior Function            PT Goals (current goals can now be found in the care plan section) Acute Rehab PT Goals Patient Stated Goal: Per pt and family for him to get stronger so he  can go home. PT Goal Formulation: With patient/family Time For Goal Achievement: 10/07/23 Progress towards PT goals: Progressing toward goals    Frequency    Min 2X/week      PT Plan      Co-evaluation              AM-PAC PT 6 Clicks Mobility   Outcome Measure  Help needed turning from your back to your side while in a flat bed without using bedrails?: A Lot Help needed moving from lying on your back to sitting on the side of a flat bed without using bedrails?: Total Help needed moving to and from a bed to a chair (including a wheelchair)?: Total Help needed standing up from a chair using your arms (e.g., wheelchair or bedside chair)?: A Lot Help needed to walk in hospital room?: Total Help needed climbing 3-5 steps with a railing? : Total 6 Click Score: 8    End of Session Equipment Utilized During Treatment: Gait belt;Other (  comment);Oxygen  (R AFO) Activity Tolerance: Patient tolerated treatment well;Patient limited by fatigue;Other (comment);Treatment limited secondary to medical complications (Comment) (tachy with pivot OOB, defer additional standing trials at that time.) Patient left: with call bell/phone within reach;in chair;with family/visitor present (daughter defers chair alarm, she will be in room until he is transferred back to bed from the chair.) Nurse Communication: Mobility status;Need for lift equipment;Precautions;Other (comment) (O2/HR) PT Visit Diagnosis: Other abnormalities of gait and mobility (R26.89);Ataxic gait (R26.0)     Time: 8253-8178 PT Time Calculation (min) (ACUTE ONLY): 35 min  Charges:    $Therapeutic Activity: 23-37 mins PT General Charges $$ ACUTE PT VISIT: 1 Visit                     Erleen Egner P., PTA Acute Rehabilitation Services Secure Chat Preferred 9a-5:30pm Office: [phone]    Connell HERO [facility] 09/25/2023, 6:43 PM

## 2023-09-25 NOTE — Progress Notes (Addendum)
 Modified Barium Swallow Study  Patient Details  Name: Evan [name] MRN: [id number] Date of Birth: June 19, 1927  Today's Date: 09/25/2023  Modified Barium Swallow completed.  Full report located under Chart Review in the Imaging Section.  History of Present Illness Evan [name] is a 89 y.o. male with medical history significant of dementia, CAD status post CABG, HFpEF, CVA, hypertension, hyperlipidemia, CKD stage IIIa, COPD, PVD, history of PE on Eliquis , lung cancer, anxiety, BPH with chronic Foley, depression, esophageal ulcer, GERD, hemochromatosis, rheumatoid arthritis, chronic low back pain, chronic anemia and thrombocytopenia. Recent admission 5/11-5/19 for metapneumovirus pneumonia and heart failure exacerbation. Admitted again 5/21-5/22 for altered mental status and right upper extremity/hand cellulitis, chest congestion, SOB. Dx with acute hypoxemic respiratory failure secondary to aspiration PNA. Patient with h/o dysphagia, multiple MBS since 2020. Study in 2020 and 2024 recommending either nectar thick or thin liquids, study complete 08/2023 recommending regular solids with nectar thick liquids due to aspiration of thin liquids. MBS on 09/11/2023 indicate presbyphagia with esophageal stenosis with a reg/thin diet rec. Pt [facility] to [facility] on 6/20 from home due to aphasia. MRI demonstrated small areas of acute ischemia within both superior frontal lobes; BSE recommended NPO on 6/21 d/t confusion/delayed swallow and wet vocal quality and plan for  MBS discussed with family. ST saw pt 6/22 and initiated Dys 3, tsp sips thin. MBS ordered stating family desired MBS.   Clinical Impression Pt's swallow function has mildly decreased since prior MBS now admitted with acute stroke. Oral phase with consistent decreased oral containment with labial spill with thin and mildly prolonged mastication with solid. Pt aspirated with medium sized cup sip thin due to decreased timing of laryngeal vestibule closure with  immediate cough that may have partially ejected barium (difficult to view). Silent aspiration occured using straw with consecutive sips and open airway with material spilling in laryngeal vestibule. Chin tuck did not make a significant difference however smaller bolus sizes were effective. Trace vallecular residue present with thin on one trial.  Esophageal scan did not reveal significant stasis that was seen during previous MBS. Recommend pt continue Dys 3 texture, thin liquids using a Provale cup provided to pt from SLP. Therapist educated Charity fundraiser and pt's daughter on use of Provale cup which administers only 10 cc amounts at a time. ST will continue to work with pt on safety/efficiency of swallow and strategies.   ADDENDUM: Called back to room by MD that pt got choked using Provale cup. SLP observed pt and he did not cough but tends to extend his head placing at higher risk. Recommend not using Provale cup and have pt drink from regular cup, small sips. Educated daughter that he is at risk with all strategies and will likely cough some when drinks inconsistently with hx of dysphagia.   Factors that may increase risk of adverse event in presence of aspiration Noe & Lianne 2021): Limited mobility  Swallow Evaluation Recommendations Recommendations: PO diet PO Diet Recommendation: Dysphagia 3 (Mechanical soft);Thin liquids (Level 0) Liquid Administration via: Other (Comment);No straw (Provale cup that admimisters only 10 cc at once) Medication Administration: Crushed with puree Supervision: Patient able to self-feed;Full supervision/cueing for swallowing strategies Swallowing strategies  : Slow rate;Small bites/sips Postural changes: Position pt fully upright for meals Oral care recommendations: Oral care BID (2x/day)      Dustin Olam Bull 09/25/2023,2:31 PM

## 2023-09-26 ENCOUNTER — Other Ambulatory Visit (HOSPITAL_COMMUNITY): Payer: Self-pay

## 2023-09-26 DIAGNOSIS — N1832 Chronic kidney disease, stage 3b: Secondary | ICD-10-CM | POA: Diagnosis not present

## 2023-09-26 DIAGNOSIS — J690 Pneumonitis due to inhalation of food and vomit: Secondary | ICD-10-CM | POA: Diagnosis not present

## 2023-09-26 DIAGNOSIS — D631 Anemia in chronic kidney disease: Secondary | ICD-10-CM | POA: Diagnosis not present

## 2023-09-26 DIAGNOSIS — I13 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 failure and stage 1 through stage 4 chronic kidney disease, or unspecified chronic kidney disease: Secondary | ICD-10-CM | POA: Diagnosis not present

## 2023-09-26 DIAGNOSIS — J9601 Acute respiratory failure with hypoxia: Secondary | ICD-10-CM | POA: Diagnosis not present

## 2023-09-26 DIAGNOSIS — J449 Chronic obstructive pulmonary disease, unspecified: Secondary | ICD-10-CM | POA: Diagnosis not present

## 2023-09-26 DIAGNOSIS — I5033 Acute on chronic diastolic (congestive) heart failure: Secondary | ICD-10-CM | POA: Diagnosis not present

## 2023-09-26 DIAGNOSIS — I6340 Cerebral infarction due to embolism of unspecified cerebral artery: Secondary | ICD-10-CM | POA: Diagnosis not present

## 2023-09-26 DIAGNOSIS — F0393 Unspecified dementia, unspecified severity, with mood disturbance: Secondary | ICD-10-CM | POA: Diagnosis not present

## 2023-09-26 DIAGNOSIS — F0394 Unspecified dementia, unspecified severity, with anxiety: Secondary | ICD-10-CM | POA: Diagnosis not present

## 2023-09-26 LAB — VITAMIN B1: Vitamin B1 (Thiamine): 119.2 nmol/L (ref 66.5–200.0)

## 2023-09-26 LAB — METHYLMALONIC ACID, SERUM: Methylmalonic Acid, Quantitative: 269 nmol/L (ref 0–378)

## 2023-09-26 MED ORDER — AMOXICILLIN-POT CLAVULANATE 400-57 MG/5ML PO SUSR
875.0000 mg | Freq: Two times a day (BID) | ORAL | 0 refills | Status: AC
  Filled 2023-09-26: qty 100, 5d supply, fill #0

## 2023-09-26 NOTE — Care Management Important Message (Signed)
 Important Message  Patient Details  Name: Evan [name] MRN: [id number] Date of Birth: [birth year]   Important Message Given:  Yes - Medicare IM     Jon Cruel 09/26/2023, 12:03 PM

## 2023-09-26 NOTE — Discharge Summary (Signed)
 Physician Discharge Summary  Evan [name] FMW:[id number] DOB: November 05, 1927 DOA: 09/22/2023  PCP: [name], MD  Admit date: 09/22/2023 Discharge date: 09/26/2023  Admitted From: home Disposition:  home  Recommendations for Outpatient Follow-up:  Follow up with PCP in 1-2 weeks  Home Health: PT Equipment/Devices: home O2  Discharge Condition: stable CODE STATUS: DNR Diet Orders (From admission, onward)     Start     Ordered   09/24/23 1208  DIET DYS 3 Room service appropriate? No; Fluid consistency: Thin  Diet effective now       Question Answer Comment  Room service appropriate? No   Fluid consistency: Thin      09/24/23 1208           Brief Narrative / Interim history:  89 y.o. male with medical history significant of dementia, prior history of CVA, history of lung cancer, BPH, coronary artery disease, rheumatoid arthritis, history of subdural hematoma, GERD, depression with anxiety who was brought in from home by family due to aphasia.  Around 5 PM the day of admission, all of a sudden he was unable to communicate.  Family brought him to the hospital and neurology was consulted.  An MRI of the brain showed small areas of acute ischemia within both superior frontal lobes in the right MCA and left ACA territories.  Hospital Course / Discharge diagnoses: Principal Problem:   CVA (cerebral vascular accident) (HCC) Active Problems:   Gastro-esophageal reflux disease without esophagitis   Hyperlipidemia, unspecified   S/P CABG (coronary artery bypass graft)   Permanent atrial fibrillation (HCC)   Rheumatoid arthritis, unspecified (HCC)   HFmrEF (heart failure with mildly reduced ejection fraction) (HCC)   Chronic respiratory failure, unsp w hypoxia or hypercapnia (HCC)   Essential hypertension   CKD stage 3b, GFR 30-44 ml/min (HCC) - baseline SCr 1.2-1.5   COPD (chronic obstructive pulmonary disease) (HCC)   Dementia without behavioral disturbance  (HCC)   Principal problem Acute CVA -patient was admitted to the hospital with sudden onset expressive aphasia.  MRI of the brain on admission showed acute ischemia within both superior frontal lobes, on the right MCA and left ACA territories, and probable punctate focus of acute ischemia in the left occipital lobe.  Neurology consulted and followed patient while hospitalized.  He underwent stroke workup, lipid panel shows an LDL of 31.  A1c is 5.7.  A 2D echocardiogram showed LVEF 60-65%, no WMA, moderate LVH, RV systolic function was mildly reduced. therapy evaluation recommends home health PT.  Appears to have persistent symptoms with some expressive aphasia although appears improved.  Speech evaluated, he was cleared for dysphagia 3 diet with thin liquids   Active problems Chronic atrial fibrillation-appears rate controlled, on Eliquis  2.5 mg at home. Back on Eliquis  Essential hypertension-resume home medications  Hypokalemia-replace as indicated and continue to monitor as an outpatient History of COPD-no wheezing, no exacerbation.  Continue home medication.  Family reports chronic cough.  There is a degree of chronic dyspnea, he was found to have low oxygen  saturations at time, he will be prescribed oxygen  at home Possible aspiration pneumonia-continue Augmentin  upon discharge Dementia, depression-continue fluoxetine  Hyperlipidemia-continue statin BPH-continue tamsulosin  CKD 3A-creatinine 0.9-1.2 at baseline, currently at baseline CAD, status post CABG -no chest pain, stable, continue statin Anemia, thrombocytopenia-chronic, stable, no bleeding  Sepsis ruled out   Discharge Instructions  Discharge Instructions     Ambulatory referral to Neurology   Complete by: As directed    Follow up with Harlene  in 4-6 weeks. Pt is Jessica's pt. Thanks.      Allergies as of 09/26/2023       Reactions   Feldene [piroxicam] Rash   Blistering rash   Neomycin -polymyxin-hc Itching   Statins  Other (See Comments)   Myalgias   Latex Rash   Band Aid   Tequin [gatifloxacin] Swelling, Anxiety   Valium [diazepam] Other (See Comments)   Dizziness   Vibramycin  [doxycycline ] Itching, Nausea Only        Medication List     TAKE these medications    acetaminophen  325 MG tablet Commonly known as: TYLENOL  Take 325-650 mg by mouth 2 (two) times daily as needed for mild pain (pain score 1-3), moderate pain (pain score 4-6), fever or headache.   amoxicillin -clavulanate 400-57 MG/5ML suspension Commonly known as: AUGMENTIN  Take 10.9 mLs (875 mg total) by mouth 2 (two) times daily for 5 days.   apixaban  2.5 MG Tabs tablet Commonly known as: Eliquis  Take 1 tablet (2.5 mg total) by mouth 2 (two) times daily.   FeroSul 325 (65 Fe) MG tablet Generic drug: ferrous sulfate  TAKE 1 TABLET EVERY DAY   FLUoxetine  20 MG capsule Commonly known as: PROZAC  TAKE 1 CAPSULE EVERY DAY   folic acid  1 MG tablet Commonly known as: FOLVITE  Take 1 tablet (1 mg total) by mouth daily.   furosemide  40 MG tablet Commonly known as: LASIX  TAKE 1 TABLET EVERY DAY What changed: when to take this   hydrOXYzine  10 MG/5ML syrup Commonly known as: ATARAX  Take 5 mLs (10 mg total) by mouth every 8 (eight) hours as needed for itching.   melatonin 3 MG Tabs tablet Take 1 tablet (3 mg total) by mouth at bedtime as needed (insomnia). What changed: when to take this   Mens 50+ Multivitamin Tabs Take 1 tablet by mouth daily.   metoprolol  tartrate 25 MG tablet Commonly known as: LOPRESSOR  Take 25 mg by mouth daily.   mupirocin  ointment 2 % Commonly known as: BACTROBAN  Apply 1 Application topically 2 (two) times daily.   nitroGLYCERIN  0.4 MG SL tablet Commonly known as: NITROSTAT  Place 1 tablet (0.4 mg total) under the tongue every 5 (five) minutes as needed for chest pain.   nystatin  powder Commonly known as: MYCOSTATIN /NYSTOP  Apply a thin layer topically to the affected area 2-3 (two to three)  times daily.   pantoprazole  40 MG tablet Commonly known as: PROTONIX  TAKE 1 TABLET EVERY MORNING   rosuvastatin  5 MG tablet Commonly known as: CRESTOR  Take 1 tablet (5 mg total) by mouth at bedtime. What changed: when to take this   senna-docusate 8.6-50 MG tablet Commonly known as: Stimulant Laxative Take 2 tablets by mouth 2 (two) times daily as needed for constipation   Symbicort  160-4.5 MCG/ACT inhaler Generic drug: budesonide -formoterol  Inhale 2 puffs into the lungs in the morning and at bedtime.   tamsulosin  0.4 MG Caps capsule Commonly known as: FLOMAX  Take 1 capsule (0.4 mg total) by mouth daily with supper. What changed: when to take this   Zinc  Oxide 12.8 % ointment Commonly known as: TRIPLE PASTE Apply topically 4 (four) times daily as needed (skin barrier). What changed:  how much to take when to take this               Durable Medical Equipment  (From admission, onward)           Start     Ordered   09/26/23 0902  For home use only DME oxygen   Once  Question Answer Comment  Length of Need Lifetime   Mode or (Route) Nasal cannula   Liters per Minute 2   Frequency Continuous (stationary and portable oxygen  unit needed)   Oxygen  delivery system Gas      09/26/23 0901   09/26/23 0836  For home use only DME oxygen   Once       Question Answer Comment  Length of Need 6 Months   Mode or (Route) Nasal cannula   Liters per Minute 2   Frequency Continuous (stationary and portable oxygen  unit needed)   Oxygen  conserving device No   Oxygen  delivery system Gas      09/26/23 0836            Follow-up Information     Whitfield Raisin, NP. Schedule an appointment as soon as possible for a visit in 1 month(s).   Specialty: Neurology Why: stroke clinic Contact information: [address] [place] [address] [phone]         [facility] Follow up.   Specialty: Home Health Services Why: Hedda will contact you for  the first home visit. Contact information: [address] [place] [address] [phone]                 Consultations: Neurology   Procedures/Studies:  [facility] Swallowing Func-Speech Pathology Result Date: 09/25/2023 Table formatting from the original result was not included. Modified Barium Swallow Study Patient Details Name: Evan [name] MRN: [id number] Date of Birth: 1928-01-26 Today's Date: 09/25/2023 HPI/PMH: HPI: Evan [name] is a 89 y.o. male with medical history significant of dementia, CAD status post CABG, HFpEF, CVA, hypertension, hyperlipidemia, CKD stage IIIa, COPD, PVD, history of PE on Eliquis , lung cancer, anxiety, BPH with chronic Foley, depression, esophageal ulcer, GERD, hemochromatosis, rheumatoid arthritis, chronic low back pain, chronic anemia and thrombocytopenia. Recent admission 5/11-5/19 for metapneumovirus pneumonia and heart failure exacerbation. Admitted again 5/21-5/22 for altered mental status and right upper extremity/hand cellulitis, chest congestion, SOB. Dx with acute hypoxemic respiratory failure secondary to aspiration PNA. Patient with h/o dysphagia, multiple MBS since 2020. Study in 2020 and 2024 recommending either nectar thick or thin liquids, study complete 08/2023 recommending regular solids with nectar thick liquids due to aspiration of thin liquids. MBS on 09/11/2023 indicate presbyphagia with esophageal stenosis with a reg/thin diet rec. Pt presents to [facility] on 6/20 from home due to aphasia. MRI demonstrated small areas of acute ischemia within both superior frontal lobes; BSE recommended NPO on 6/21 d/t confusion/delayed swallow and wet vocal quality and plan for  MBS discussed with family. ST saw pt 6/22 and initiated Dys 3, tsp sips thin. MBS ordered stating family desired MBS. Clinical Impression: Clinical Impression: Pt's swallow function has mildly decreased since prior MBS now admitted with acute stroke. Oral phase with consistent decreased  oral containment with labial spill with thin and mildly prolonged mastication with solid. Pt aspirated with medium sized cup sip thin due to decreased timing of laryngeal vestibule closure with immediate cough that may have partially ejected barium (difficult to view). Silent aspiration occured using straw with consecutive sips and open airway with material spilling in laryngeal vestibule. Chin tuck did not make a significant difference however smaller bolus sizes were effective. Trace vallecular residue present with thin on one trial.  Esophageal scan did not reveal significant stasis that was seen during previous MBS. Recommend pt continue Dys 3 texture, thin liquids using a Provale cup provided to pt from SLP. Therapist  educated Charity fundraiser and pt's daughter on use of Provale cup which administers only 15 cc amounts at a time. ST will continue to work with pt on safety/efficiency of swallow and strategies. Factors that may increase risk of adverse event in presence of aspiration Noe & Lianne 2021): Factors that may increase risk of adverse event in presence of aspiration Noe & Lianne 2021): Limited mobility Recommendations/Plan: Swallowing Evaluation Recommendations Swallowing Evaluation Recommendations Recommendations: PO diet PO Diet Recommendation: Dysphagia 3 (Mechanical soft); Thin liquids (Level 0) Liquid Administration via: Other (Comment); No straw (Provale cup that admimisters only 10 cc at once) Medication Administration: Crushed with puree Supervision: Patient able to self-feed; Full supervision/cueing for swallowing strategies Swallowing strategies  : Slow rate; Small bites/sips Postural changes: Position pt fully upright for meals Oral care recommendations: Oral care BID (2x/day) Treatment Plan Treatment Plan Treatment recommendations: Therapy as outlined in treatment plan below Follow-up recommendations: -- (TBD) Functional status assessment: Patient has had a recent decline in their functional  status and demonstrates the ability to make significant improvements in function in a reasonable and predictable amount of time. Treatment frequency: Min 2x/week Treatment duration: 2 weeks Interventions: Aspiration precaution training; Patient/family education; Compensatory techniques; Diet toleration management by SLP Recommendations Recommendations for follow up therapy are one component of a multi-disciplinary discharge planning process, led by the attending physician.  Recommendations may be updated based on patient status, additional functional criteria and insurance authorization. Assessment: Orofacial Exam: Orofacial Exam Oral Cavity: Oral Hygiene: WFL Oral Cavity - Dentition: Missing dentition Orofacial Anatomy: Other (comment) Oral Motor/Sensory Function: Suspected cranial nerve impairment CN V - Trigeminal: Left motor impairment CN VII - Facial: Left motor impairment Anatomy: Anatomy: Other (Comment) (what appears to be calcification along posterior pharyhngeal wall, above UES again noted, unchanged from previous studies) Boluses Administered: Boluses Administered Boluses Administered: Thin liquids (Level 0); Mildly thick liquids (Level 2, nectar thick); Moderately thick liquids (Level 3, honey thick); Puree; Solid  Oral Impairment Domain: Oral Impairment Domain Lip Closure: Escape beyond mid-chin Tongue control during bolus hold: Cohesive bolus between tongue to palatal seal Bolus preparation/mastication: Slow prolonged chewing/mashing with complete recollection Bolus transport/lingual motion: Brisk tongue motion Oral residue: Complete oral clearance Location of oral residue : N/A Initiation of pharyngeal swallow : Pyriform sinuses  Pharyngeal Impairment Domain: Pharyngeal Impairment Domain Soft palate elevation: No bolus between soft palate (SP)/pharyngeal wall (PW) Laryngeal elevation: Partial superior movement of thyroid  cartilage/partial approximation of arytenoids to epiglottic petiole Anterior  hyoid excursion: Complete anterior movement Epiglottic movement: Complete inversion Laryngeal vestibule closure: Incomplete, narrow column air/contrast in laryngeal vestibule Pharyngeal stripping wave : Present - complete Pharyngeal contraction (A/P view only): N/A Pharyngoesophageal segment opening: Complete distension and complete duration, no obstruction of flow Tongue base retraction: No contrast between tongue base and posterior pharyngeal wall (PPW) Pharyngeal residue: Trace residue within or on pharyngeal structures Location of pharyngeal residue: Valleculae (x1 with thin)  Esophageal Impairment Domain: Esophageal Impairment Domain Esophageal clearance upright position: Complete clearance, esophageal coating Pill: No data recorded Penetration/Aspiration Scale Score: Penetration/Aspiration Scale Score 1.  Material does not enter airway: Mildly thick liquids (Level 2, nectar thick); Moderately thick liquids (Level 3, honey thick); Puree; Solid 7.  Material enters airway, passes BELOW cords and not ejected out despite cough attempt by patient: Thin liquids (Level 0) 8.  Material enters airway, passes BELOW cords without attempt by patient to eject out (silent aspiration) : Thin liquids (Level 0) Compensatory Strategies: Compensatory Strategies Compensatory strategies: Yes Straw: Ineffective Ineffective Straw:  Thin liquid (Level 0) Chin tuck: Ineffective (no significant difference)   General Information: Caregiver present: No  Diet Prior to this Study: Dysphagia 3 (mechanical soft); Thin liquids (Level 0); Other (Comment) (thin via tsp)   Temperature : Normal   Respiratory Status: WFL   Supplemental O2: Nasal cannula   History of Recent Intubation: No  Behavior/Cognition: Alert; Cooperative; Pleasant mood; Requires cueing Self-Feeding Abilities: Able to self-feed Baseline vocal quality/speech: Dysphonic Volitional Cough: Able to elicit No data recorded Exam Limitations: No limitations Goal Planning: Prognosis for  improved oropharyngeal function: Good No data recorded No data recorded Patient/Family Stated Goal: Pt states concerns about him not being hydrated and a desire to return back to baseline for swallowing Consulted and agree with results and recommendations: Patient; Family member/caregiver Pain: Pain Assessment Pain Assessment: No/denies pain Faces Pain Scale: 0 Breathing: 0 Negative Vocalization: 0 Facial Expression: 0 Body Language: 0 Consolability: 0 PAINAD Score: 0 End of Session: Start Time:SLP Start Time (ACUTE ONLY): 1247 Stop Time: SLP Stop Time (ACUTE ONLY): 1300 Time Calculation:SLP Time Calculation (min) (ACUTE ONLY): 13 min Charges: SLP Evaluations $ SLP Speech Visit: 1 Visit SLP Evaluations $MBS Swallow: 1 Procedure $Swallowing Treatment: 1 Procedure SLP visit diagnosis: SLP Visit Diagnosis: Dysphagia, oropharyngeal phase (R13.12) Past Medical History: Past Medical History: Diagnosis Date  Acute blood loss anemia   Acute bronchitis 05/15/2017  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 07/14/2018  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  Altered mental status 08/21/2018  Anginal chest pain at rest [facility])   Chronic non, controlled on Lorazepam   Anxiety   Arthritis   all over   BPH (benign prostatic hyperplasia)   CAD (coronary artery disease)   Cellulitis of arm, right 06/01/2018  Cellulitis of left axilla   Chronic lower back pain   Complication of anesthesia   problems making water afterwards  Depression   DJD (degenerative joint disease)   Esophageal ulcer without bleeding   bid ppi indefinitely  Fall 07/16/2018  Family history of adverse reaction to anesthesia   children w/PONV  GERD (gastroesophageal reflux disease)   Haemophilus influenzae septicemia (HCC) 03/12/2023  Headache   couple/week maybe (09/04/2015)  Hemochromatosis   Possible, elevated iron  stores, hook-like osteophytes on hand films, normal LFTs  Hepatitis   yellow  jaundice as a baby  History of ASCVD   MULTIVESSEL  Hyperlipidemia   Hypertension   Hypotension   Mild aortic stenosis 06/23/2021  Osteoarthritis   Peripheral vascular disease (HCC)   Pneumonia   several times; got a little now (09/04/2015)  Pulmonary embolism (HCC) 02/02/2018  Rash and nonspecific skin eruption 06/01/2018  Rheumatoid arthritis (HCC)   RSV (respiratory syncytial virus pneumonia) 05/15/2022  Stroke (HCC)   Subdural hematoma (HCC)   small after fall 09/2016-plavix  held, neurosurgery consulted.  Asymptomatic.    Thromboembolism (HCC) 02/2018  on Lovenox  lifelong since he failed PO Eliquis  Past Surgical History: Past Surgical History: Procedure Laterality Date  ARM SKIN LESION BIOPSY / EXCISION Left 09/02/2015  CATARACT EXTRACTION W/ INTRAOCULAR LENS  IMPLANT, BILATERAL Bilateral   CHOLECYSTECTOMY OPEN    CORNEAL TRANSPLANT Bilateral   [facility] at [facility]; one at [facility]  CORONARY ANGIOPLASTY WITH STENT PLACEMENT    CORONARY ARTERY BYPASS GRAFT  1999  DESCENDING AORTIC ANEURYSM REPAIR W/ STENT    DE stent ostium into the right radial free graft at OM-, 02-2007  ESOPHAGOGASTRODUODENOSCOPY N/A 11/09/2014  Procedure: ESOPHAGOGASTRODUODENOSCOPY (EGD);  Surgeon: Norleen Hint, MD;  Location: [facility]  ENDOSCOPY;  Service: Endoscopy;  Laterality: N/A;  INGUINAL HERNIA REPAIR    JOINT REPLACEMENT    LEFT HEART CATH AND CORS/GRAFTS ANGIOGRAPHY N/A 06/27/2017  Procedure: LEFT HEART CATH AND CORS/GRAFTS ANGIOGRAPHY;  Surgeon: Burnard Debby LABOR, MD;  Location: [facility] INVASIVE CV LAB;  Service: Cardiovascular;  Laterality: N/A;  NASAL SINUS SURGERY    SHOULDER OPEN ROTATOR CUFF REPAIR Right   TOTAL KNEE ARTHROPLASTY Bilateral  Dustin Olam Bull 09/25/2023, 2:30 PM  ECHOCARDIOGRAM COMPLETE Result Date: 09/24/2023    ECHOCARDIOGRAM REPORT   Patient Name:   Evan [name] Date of Exam: 09/24/2023 Medical Rec #:  [id number]    Height:       62.0 in Accession #:    [id number]   Weight:       129.0 lb Date of Birth:  18-Jan-1928    BSA:           1.586 m Patient Age:    89 years     BP:           121/74 mmHg Patient Gender: M            HR:           95 bpm. Exam Location:  Inpatient Procedure: 2D Echo, Cardiac Doppler and Color Doppler (Both Spectral and Color            Flow Doppler were utilized during procedure). Indications:    Stroke  History:        Patient has prior history of Echocardiogram examinations, most                 recent 03/10/2023.  Sonographer:    Eva Lash Referring Phys: [id number] JINDONG XU IMPRESSIONS  1. Left ventricular ejection fraction, by estimation, is 60 to 65%. The left ventricle has normal function. The left ventricle has no regional wall motion abnormalities. There is moderate left ventricular hypertrophy. Left ventricular diastolic function  could not be evaluated.  2. Right ventricular systolic function is mildly reduced. The right ventricular size is mildly enlarged.  3. Left atrial size was severely dilated.  4. Right atrial size was moderately dilated.  5. The mitral valve is degenerative. Mild mitral valve regurgitation.  6. The tricuspid valve is abnormal. Tricuspid valve regurgitation is moderate.  7. The aortic valve is tricuspid. Aortic valve regurgitation is not visualized. Mild to moderate aortic valve stenosis. Aortic valve area, by VTI measures 1.43 cm. Aortic valve mean gradient measures 6.0 mmHg. Aortic valve Vmax measures 1.85 m/s. Peak gradien 13.7 mmHg, DI 0.34.  8. Aortic dilatation noted. There is mild dilatation of the aortic root, measuring 42 mm.  9. Rhythm strip during this exam demonstrates atrial fibrillation. Comparison(s): Changes from prior study are noted. 03/10/2023: LVEF 60-65%, RVSP 54.8 mmHg, severe LAE, moderate RAE. FINDINGS  Left Ventricle: Left ventricular ejection fraction, by estimation, is 60 to 65%. The left ventricle has normal function. The left ventricle has no regional wall motion abnormalities. The left ventricular internal cavity size was normal in size. There is  moderate  left ventricular hypertrophy. Left ventricular diastolic function could not be evaluated due to atrial fibrillation. Left ventricular diastolic function could not be evaluated. Right Ventricle: The right ventricular size is mildly enlarged. No increase in right ventricular wall thickness. Right ventricular systolic function is mildly reduced. Left Atrium: Left atrial size was severely dilated. Right Atrium: Right atrial size was moderately dilated. Pericardium: There is no evidence of pericardial effusion. Mitral Valve: The mitral valve  is degenerative in appearance. There is mild calcification of the anterior and posterior mitral valve leaflet(s). Mild mitral valve regurgitation. Tricuspid Valve: The tricuspid valve is abnormal. Tricuspid valve regurgitation is moderate. Aortic Valve: The aortic valve is tricuspid. Aortic valve regurgitation is not visualized. Mild to moderate aortic stenosis is present. Aortic valve mean gradient measures 6.0 mmHg. Aortic valve peak gradient measures 13.7 mmHg. Aortic valve area, by VTI  measures 1.43 cm. Pulmonic Valve: The pulmonic valve was not well visualized. Pulmonic valve regurgitation is not visualized. Aorta: Aortic dilatation noted. There is mild dilatation of the aortic root, measuring 42 mm. Venous: The inferior vena cava was not well visualized. IAS/Shunts: The interatrial septum was not well visualized. EKG: Rhythm strip during this exam demonstrates atrial fibrillation.  LEFT VENTRICLE PLAX 2D LVIDd:         2.80 cm LVIDs:         1.60 cm LV PW:         1.30 cm LV IVS:        1.40 cm LVOT diam:     2.30 cm LV SV:         44 LV SV Index:   28 LVOT Area:     4.15 cm  LV Volumes (MOD) LV vol d, MOD A4C: 58.9 ml LV vol s, MOD A4C: 22.2 ml LV SV MOD A4C:     58.9 ml RIGHT VENTRICLE RV S prime:     4.35 cm/s TAPSE (M-mode): 1.2 cm LEFT ATRIUM              Index LA diam:        6.20 cm  3.91 cm/m LA Vol (A2C):   89.7 ml  56.54 ml/m LA Vol (A4C):   130.0 ml 81.94 ml/m  LA Biplane Vol: 108.0 ml 68.08 ml/m  AORTIC VALVE AV Area (Vmax):    1.28 cm AV Area (Vmean):   1.37 cm AV Area (VTI):     1.43 cm AV Vmax:           185.00 cm/s AV Vmean:          112.000 cm/s AV VTI:            0.311 m AV Peak Grad:      13.7 mmHg AV Mean Grad:      6.0 mmHg LVOT Vmax:         57.00 cm/s LVOT Vmean:        36.800 cm/s LVOT VTI:          0.107 m LVOT/AV VTI ratio: 0.34  AORTA Ao Asc diam: 3.30 cm  SHUNTS Systemic VTI:  0.11 m Systemic Diam: 2.30 cm Vinie Maxcy MD Electronically signed by Vinie Maxcy MD Signature Date/Time: 09/24/2023/3:41:35 PM    Final    DG CHEST PORT 1 VIEW Result Date: 09/23/2023 CLINICAL DATA:  Wheezing. EXAM: PORTABLE CHEST 1 VIEW COMPARISON:  09/13/2023 FINDINGS: Stable cardiomediastinal contours scratch set status post median sternotomy and CABG procedure. Aortic atherosclerosis. Stable cardiomediastinal contours. Chronic coarsened interstitial markings are noted bilaterally. Blunting of the right costophrenic angle. Right midlung and right base hazy opacities noted which appears similar to 09/10/2023 when there was right lower lobe consolidative change. The visualized osseous structures are unremarkable. IMPRESSION: 1. Right midlung and right base hazy opacities which may reflect residual/persistent pneumonia. 2. Chronic coarsened interstitial markings bilaterally. 3. Blunting of the right costophrenic angle may reflect a small effusion. Electronically Signed   By: Waddell Joan HERO.D.  On: 09/23/2023 13:50   EEG adult Result Date: 09/23/2023 Shelton Arlin KIDD, MD     09/23/2023  6:31 AM Patient Name: Evan [name] MRN: [id number] Epilepsy Attending: Arlin KIDD Shelton Referring Physician/Provider: Jerrie Lola CROME, MD Date: 09/22/2023 Duration: 21.20 mins Patient history:  89 y.o. male with a past medical history of multiple vascular risk factors as documented above, presenting with acute onset aphasia. EEG to evaluate for seizure. Level of alertness: Awake AEDs  during EEG study: None Technical aspects: This EEG study was done with scalp electrodes positioned according to the 10-20 International system of electrode placement. Electrical activity was reviewed with band pass filter of 1-70Hz , sensitivity of 7 uV/mm, display speed of 32mm/sec with a 60Hz  notched filter applied as appropriate. EEG data were recorded continuously and digitally stored.  Video monitoring was available and reviewed as appropriate. Description: EEG showed continuous generalized 3 to 6 Hz theta-delta slowing. Hyperventilation and photic stimulation were not performed.   ABNORMALITY - Continuous slow, generalized IMPRESSION: This study is suggestive of moderate diffuse encephalopathy. No seizures or epileptiform discharges were seen throughout the recording. Arlin KIDD Shelton   MR BRAIN WO CONTRAST Result Date: 09/23/2023 CLINICAL DATA:  Delirium EXAM: MRI HEAD WITHOUT CONTRAST TECHNIQUE: Multiplanar, multiecho pulse sequences of the brain and surrounding structures were obtained without intravenous contrast. COMPARISON:  None Available. FINDINGS: Truncated and motion degraded examination due to patient altered mental status and inability to remain still. Only diffusion-weighted imaging and axial T2-weighted FLAIR sequence were performed. There are small areas of acute ischemia within both superior frontal lobes, affecting the right MCA territory and left ACA territory. Probable punctate focus of acute ischemia in the left occipital lobe. There is multifocal hyperintense T2-weighted signal within the periventricular and deep white matter. IMPRESSION: 1. Truncated and motion degraded examination due to patient altered mental status. 2. Small areas of acute ischemia within both superior frontal lobes, affecting the right MCA territory and left ACA territory. 3. Probable punctate focus of acute ischemia in the left occipital lobe. Electronically Signed   By: Franky Stanford M.D.   On: 09/23/2023 03:40    CT ANGIO HEAD NECK W WO CM (CODE STROKE) Result Date: 09/22/2023 CLINICAL DATA:  Aphasia EXAM: CT ANGIOGRAPHY HEAD AND NECK WITH AND WITHOUT CONTRAST TECHNIQUE: Multidetector CT imaging of the head and neck was performed using the standard protocol during bolus administration of intravenous contrast. Multiplanar CT image reconstructions and MIPs were obtained to evaluate the vascular anatomy. Carotid stenosis measurements (when applicable) are obtained utilizing NASCET criteria, using the distal internal carotid diameter as the denominator. RADIATION DOSE REDUCTION: This exam was performed according to the departmental dose-optimization program which includes automated exposure control, adjustment of the mA and/or kV according to patient size and/or use of iterative reconstruction technique. CONTRAST:  75mL OMNIPAQUE  IOHEXOL  350 MG/ML SOLN COMPARISON:  07/14/2018 FINDINGS: CTA NECK FINDINGS Skeleton: No acute abnormality or high grade bony spinal canal stenosis. Other neck: Normal pharynx, larynx and major salivary glands. No cervical lymphadenopathy. Unremarkable thyroid  gland. Upper chest: Emphysema and unchanged right paratracheal adenopathy. Aortic arch: There is calcific atherosclerosis of the aortic arch. Conventional 3 vessel aortic branching pattern. RIGHT carotid system: No dissection, occlusion or aneurysm. There is mixed density atherosclerosis extending into the proximal ICA, resulting in 50% stenosis. LEFT carotid system: Bulky, circumferential, mixed density atherosclerotic disease at the left carotid bifurcation extending into the proximal external and internal carotid arteries. There is 60% proximal left ICA stenosis. Vertebral arteries: Left dominant  configuration. There is no dissection, occlusion or flow-limiting stenosis to the skull base (V1-V3 segments). CTA HEAD FINDINGS POSTERIOR CIRCULATION: Mild atherosclerosis of the left vertebral artery V4 segment. Right vertebral artery is  diminutive but patent. No proximal occlusion of the anterior or inferior cerebellar arteries. Basilar artery is normal. Superior cerebellar arteries are normal. Posterior cerebral arteries are normal. ANTERIOR CIRCULATION: Atherosclerotic calcification of the internal carotid arteries at the skull base without hemodynamically significant stenosis. Azygous anterior cerebral artery. Middle cerebral arteries are normal. Venous sinuses: As permitted by contrast timing, patent. Anatomic variants: Fetal origins of both posterior cerebral arteries. Review of the MIP images confirms the above findings. IMPRESSION: 1. No emergent large vessel occlusion. 2. Bilateral carotid bifurcation atherosclerosis with 50% stenosis of the proximal right ICA and 60% stenosis of the proximal left ICA. Aortic Atherosclerosis (ICD10-I70.0) and Emphysema (ICD10-J43.9). Electronically Signed   By: Franky Stanford M.D.   On: 09/22/2023 20:36   CT HEAD CODE STROKE WO CONTRAST Result Date: 09/22/2023 CLINICAL DATA:  Code stroke.  Acute neurologic deficit EXAM: CT HEAD WITHOUT CONTRAST TECHNIQUE: Contiguous axial images were obtained from the base of the skull through the vertex without intravenous contrast. RADIATION DOSE REDUCTION: This exam was performed according to the departmental dose-optimization program which includes automated exposure control, adjustment of the mA and/or kV according to patient size and/or use of iterative reconstruction technique. COMPARISON:  04/28/2022 FINDINGS: Brain: There is no mass, hemorrhage or extra-axial collection. There is generalized atrophy without lobar predilection. There is hypoattenuation of the periventricular white matter, most commonly indicating chronic ischemic microangiopathy. Vascular: Atherosclerotic calcification of the vertebral and internal carotid arteries at the skull base. No abnormal hyperdensity of the major intracranial arteries or dural venous sinuses. Skull: The visualized skull  base, calvarium and extracranial soft tissues are normal. Sinuses/Orbits: Mild ethmoid sinus mucosal thickening. The orbits are normal. ASPECTS [facility] Stroke Program Early CT Score) - Ganglionic level infarction (caudate, lentiform nuclei, internal capsule, insula, M1-M3 cortex): 7 - Supraganglionic infarction (M4-M6 cortex): 3 Total score (0-10 with 10 being normal): 10 IMPRESSION: 1. No acute intracranial abnormality. 2. ASPECTS is 10. These results were communicated to Dr. Lola Jernigan at 8:04 pm on 09/22/2023 by text page via the [facility] messaging system. Electronically Signed   By: Franky Stanford M.D.   On: 09/22/2023 20:05   DG CHEST PORT 1 VIEW Result Date: 09/13/2023 CLINICAL DATA:  Sudden onset shortness of breath EXAM: PORTABLE CHEST 1 VIEW COMPARISON:  09/10/2023 FINDINGS: Cardiac shadow is prominent but stable. Postsurgical changes are noted. Aortic calcifications are seen. Slight increase in the degree of interstitial changes are noted consistent with worsening edema. No focal confluent infiltrate or effusion is noted. IMPRESSION: Worsening edema. Electronically Signed   By: Oneil Devonshire M.D.   On: 09/13/2023 00:26   DG Swallowing Func-Speech Pathology Result Date: 09/10/2023 Table formatting from the original result was not included. Modified Barium Swallow Study Patient Details Name: ROLEN CONGER MRN: [id number] Date of Birth: 1928/02/14 Today's Date: 09/10/2023 HPI/PMH: HPI: DAYVIAN BLIXT is a 89 y.o. male with medical history significant of dementia, CAD status post CABG, HFpEF, CVA, hypertension, hyperlipidemia, CKD stage IIIa, COPD, PVD, history of PE on Eliquis , lung cancer, anxiety, BPH with chronic Foley, depression, esophageal ulcer, GERD, hemochromatosis, rheumatoid arthritis, chronic low back pain, chronic anemia and thrombocytopenia.  Recent admission 5/11-5/19 for metapneumovirus pneumonia and heart failure exacerbation.  Admitted again 5/21-5/22 for altered mental status and right upper  extremity/hand cellulitis, chest congestion,  SOB. Dx with acute hypoxemic respiratory failure secondary to aspiration PNA. Patient with h/o dysphagia, multiple MBS since 2020. Study in 2020 and 2024 recommending either nectar thick or thin liquids,  study complete 08/2023 recommending regular solids with nectar thick liquids due to aspiration of thin liquids. Clinical Impression: Clinical Impression: Patient presents with an age appropriate, functional oropharyngeal swallow based on today's assessment. Oral phase appropriate with timely mastication and full oral clearance. Swallow initiated at the level of the vallecula with all boluses with the acception of thin liquids which were intermittently initiated at the pyriform sinuses resulting in flash penetration of thin liquids which is considered normal for age. No aspiration observed, even when challenged with large consecutive boluses via straw. Most significant finding was notable stasis througout esophagus, only post intake of previously noted large serial swallows of thin liquid. Liquid did not clear independently over approximately 60 seconds. Given bite of pureed solid which quickly pushed entire contents of esophagus into the stomach. Subseqent single small sips of thin liquid passed quickly. 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 Defer further w/u to MD. Education complete with son who was present for the study. Suspect that this patient, with advanced age, h/o CVA, and fluctuating dysphagia documented for at least 5 years, will continue to have fluctuating swallowing abilities with chronic risk of aspiration, exacerbated by suspected esophageal deficits. Recommending thin liquids at this time. SLP will f/u to reinforce use of precautions. Factors that may increase risk of adverse event in presence of aspiration Noe & Lianne 2021): Factors that may increase risk of adverse event in presence of aspiration Noe & Lianne 2021): Respiratory or GI  disease; Limited mobility Recommendations/Plan: Swallowing Evaluation Recommendations Swallowing Evaluation Recommendations Recommendations: PO diet PO Diet Recommendation: Regular; Thin liquids (Level 0) Liquid Administration via: Cup; Straw Medication Administration: Whole meds with puree Supervision: Patient able to self-feed; Full supervision/cueing for swallowing strategies Swallowing strategies  : Slow rate; Small bites/sips (single sips) Postural changes: Stay upright 30-60 min after meals; Position pt fully upright for meals Oral care recommendations: Oral care BID (2x/day) Recommended consults: Consider esophageal assessment Treatment Plan Treatment Plan Treatment recommendations: Therapy as outlined in treatment plan below Follow-up recommendations: Home health SLP Functional status assessment: Patient has had a recent decline in their functional status and demonstrates the ability to make significant improvements in function in a reasonable and predictable amount of time. Treatment frequency: Min 1x/week Treatment duration: 1 week Interventions: Aspiration precaution training; Compensatory techniques; Patient/family education; Diet toleration management by SLP Recommendations Recommendations for follow up therapy are one component of a multi-disciplinary discharge planning process, led by the attending physician.  Recommendations may be updated based on patient status, additional functional criteria and insurance authorization. Assessment: Orofacial Exam: Orofacial Exam Oral Cavity: Oral Hygiene: WFL Oral Cavity - Dentition: Adequate natural dentition Orofacial Anatomy: WFL Oral Motor/Sensory Function: Suspected cranial nerve impairment CN VII - Facial: Right motor impairment (baseline) Anatomy: Anatomy: Other (Comment) (what appears to be calcification along posterior pharyhngeal wall, above UES again noted, unchanged from previous studies) Boluses Administered: Boluses Administered Boluses Administered:  Thin liquids (Level 0); Mildly thick liquids (Level 2, nectar thick); Puree; Solid  Oral Impairment Domain: Oral Impairment Domain Lip Closure: Escape beyond mid-chin Tongue control during bolus hold: Cohesive bolus between tongue to palatal seal Bolus preparation/mastication: Slow prolonged chewing/mashing with complete recollection Bolus transport/lingual motion: Brisk tongue motion Initiation of pharyngeal swallow : Pyriform sinuses  Pharyngeal Impairment Domain: Pharyngeal Impairment Domain Soft palate elevation: No bolus between  soft palate (SP)/pharyngeal wall (PW) Laryngeal elevation: Complete superior movement of thyroid  cartilage with complete approximation of arytenoids to epiglottic petiole Anterior hyoid excursion: Complete anterior movement Epiglottic movement: Complete inversion Laryngeal vestibule closure: Incomplete, narrow column air/contrast in laryngeal vestibule Pharyngeal stripping wave : Present - complete Pharyngeal contraction (A/P view only): N/A Pharyngoesophageal segment opening: Complete distension and complete duration, no obstruction of flow Tongue base retraction: No contrast between tongue base and posterior pharyngeal wall (PPW) Pharyngeal residue: Complete pharyngeal clearance  Esophageal Impairment Domain: Esophageal Impairment Domain Esophageal clearance upright position: Esophageal retention Pill: Pill Consistency administered: Mildly thick liquids (Level 2, nectar thick) Mildly thick liquids (Level 2, nectar thick): WFL Penetration/Aspiration Scale Score: Penetration/Aspiration Scale Score 1.  Material does not enter airway: Mildly thick liquids (Level 2, nectar thick); Puree; Solid; Pill 2.  Material enters airway, remains ABOVE vocal cords then ejected out: Thin liquids (Level 0) Compensatory Strategies: No data recorded  General Information: Caregiver present: Yes  Diet Prior to this Study: NPO   Temperature : Normal   Respiratory Status: WFL   Supplemental O2: Nasal cannula    History of Recent Intubation: No  Behavior/Cognition: Alert; Cooperative; Pleasant mood; Requires cueing Self-Feeding Abilities: Able to self-feed Baseline vocal quality/speech: Dysphonic Volitional Cough: Able to elicit Volitional Swallow: Able to elicit No data recorded Goal Planning: Prognosis for improved oropharyngeal function: Good No data recorded No data recorded No data recorded Consulted and agree with results and recommendations: Patient; Family member/caregiver Pain: Pain Assessment Pain Assessment: No/denies pain End of Session: Start Time:SLP Start Time (ACUTE ONLY): 0935 Stop Time: SLP Stop Time (ACUTE ONLY): 0955 Time Calculation:SLP Time Calculation (min) (ACUTE ONLY): 20 min Charges: SLP Evaluations $ SLP Speech Visit: 1 Visit SLP Evaluations $BSS Swallow: 1 Procedure $MBS Swallow: 1 Procedure SLP visit diagnosis: SLP Visit Diagnosis: Dysphagia, oropharyngeal phase (R13.12) Past Medical History: Past Medical History: Diagnosis Date  Acute blood loss anemia   Acute bronchitis 05/15/2017  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 07/14/2018  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  Altered mental status 08/21/2018  Anginal chest pain at rest [facility])   Chronic non, controlled on Lorazepam   Anxiety   Arthritis   all over   BPH (benign prostatic hyperplasia)   CAD (coronary artery disease)   Cellulitis of arm, right 06/01/2018  Cellulitis of left axilla   Chronic lower back pain   Complication of anesthesia   problems making water afterwards  Depression   DJD (degenerative joint disease)   Esophageal ulcer without bleeding   bid ppi indefinitely  Fall 07/16/2018  Family history of adverse reaction to anesthesia   children w/PONV  GERD (gastroesophageal reflux disease)   Haemophilus influenzae septicemia (HCC) 03/12/2023  Headache   couple/week maybe (09/04/2015)  Hemochromatosis   Possible, elevated iron  stores, hook-like  osteophytes on hand films, normal LFTs  Hepatitis   yellow jaundice as a baby  History of ASCVD   MULTIVESSEL  Hyperlipidemia   Hypertension   Hypotension   Mild aortic stenosis 06/23/2021  Osteoarthritis   Peripheral vascular disease (HCC)   Pneumonia   several times; got a little now (09/04/2015)  Pulmonary embolism (HCC) 02/02/2018  Rash and nonspecific skin eruption 06/01/2018  Rheumatoid arthritis (HCC)   RSV (respiratory syncytial virus pneumonia) 05/15/2022  Subdural hematoma (HCC)   small after fall 09/2016-plavix  held, neurosurgery consulted.  Asymptomatic.    Thromboembolism (HCC) 02/2018  on Lovenox  lifelong since he failed PO Eliquis  Past Surgical History:  Past Surgical History: Procedure Laterality Date  ARM SKIN LESION BIOPSY / EXCISION Left 09/02/2015  CATARACT EXTRACTION W/ INTRAOCULAR LENS  IMPLANT, BILATERAL Bilateral   CHOLECYSTECTOMY OPEN    CORNEAL TRANSPLANT Bilateral   one at [facility]; one at [facility]  CORONARY ANGIOPLASTY WITH STENT PLACEMENT    CORONARY ARTERY BYPASS GRAFT  1999  DESCENDING AORTIC ANEURYSM REPAIR W/ STENT    DE stent ostium into the right radial free graft at OM-, 02-2007  ESOPHAGOGASTRODUODENOSCOPY N/A 11/09/2014  Procedure: ESOPHAGOGASTRODUODENOSCOPY (EGD);  Surgeon: Norleen Hint, MD;  Location: [facility] ENDOSCOPY;  Service: Endoscopy;  Laterality: N/A;  INGUINAL HERNIA REPAIR    JOINT REPLACEMENT    LEFT HEART CATH AND CORS/GRAFTS ANGIOGRAPHY N/A 06/27/2017  Procedure: LEFT HEART CATH AND CORS/GRAFTS ANGIOGRAPHY;  Surgeon: Burnard Debby LABOR, MD;  Location: [facility] INVASIVE CV LAB;  Service: Cardiovascular;  Laterality: N/A;  NASAL SINUS SURGERY    SHOULDER OPEN ROTATOR CUFF REPAIR Right   TOTAL KNEE ARTHROPLASTY Bilateral  Rea Pass MA, CCC-SLP s Pass Rea Meryl 09/10/2023, 10:32 AM  DG Chest 1 View Result Date: 09/10/2023 CLINICAL DATA:  Shortness of breath. EXAM: CHEST  1 VIEW COMPARISON:  Chest radiograph dated 08/23/2023. FINDINGS: There is diffuse chronic intra coarsening. Right lung base  atelectasis/scarring. Pneumonia is not excluded. No large pleural effusion. No pneumothorax. Stable cardiac silhouette. Median sternotomy wires. No acute osseous pathology. IMPRESSION: Right lung base atelectasis/scarring. Pneumonia is not excluded. Electronically Signed   By: Vanetta Chou M.D.   On: 09/10/2023 09:44   CT Chest Wo Contrast Result Date: 09/09/2023 CLINICAL DATA:  Pneumonia, complication suspected, xray done outpt xray - concern for PNA EXAM: CT CHEST WITHOUT CONTRAST TECHNIQUE: Multidetector CT imaging of the chest was performed following the standard protocol without IV contrast. RADIATION DOSE REDUCTION: This exam was performed according to the departmental dose-optimization program which includes automated exposure control, adjustment of the mA and/or kV according to patient size and/or use of iterative reconstruction technique. COMPARISON:  08/23/2023, 03/13/2023 FINDINGS: Cardiovascular: Unenhanced imaging of the heart demonstrates cardiomegaly without pericardial effusion. Prominent left atrial dilatation. Postsurgical changes from prior CABG. Normal caliber of the thoracic aorta. Atherosclerosis of the aorta and coronary vasculature. Assessment of the vascular lumen cannot be performed without intravenous contrast. Mediastinum/Nodes: Borderline enlarged mediastinal adenopathy, measuring up to 10 mm in the right paratracheal station. Thyroid , trachea, and esophagus are unremarkable. Lungs/Pleura: Diffuse subpleural interlobular septal thickening consistent with chronic scarring. There is superimposed dense airspace disease within the dependent right lower lobe consistent with aspiration or infection. Trace right pleural effusion. No pneumothorax. Central airways are patent. Upper Abdomen: No acute abnormality. Musculoskeletal: No acute or destructive bony abnormalities. Reconstructed images demonstrate no additional findings. IMPRESSION: 1. Dense right basilar consolidation consistent  with infection or aspiration. 2. Trace right pleural effusion. 3. Basilar predominant subpleural scarring and fibrosis. 4.  Aortic Atherosclerosis (ICD10-I70.0). 5. Cardiomegaly, with diffuse coronary artery atherosclerosis. Electronically Signed   By: Ozell Daring M.D.   On: 09/09/2023 19:53     Subjective: - no chest pain, shortness of breath, no abdominal pain, nausea or vomiting.   Discharge Exam: BP 128/71 (BP Location: Right Arm)   Pulse 85   Temp 98.1 F (36.7 C) (Oral)   Resp 15   Ht 5' 2 (1.575 m)   Wt 58.5 kg Comment: 09/09/2023  SpO2 100%   BMI 23.59 kg/m   General: Pt is alert, awake, not in acute distress Cardiovascular: RRR, S1/S2 +, no rubs, no gallops Respiratory: CTA  bilaterally, no wheezing, no rhonchi Abdominal: Soft, NT, ND, bowel sounds + Extremities: no edema, no cyanosis    The results of significant diagnostics from this hospitalization (including imaging, microbiology, ancillary and laboratory) are listed below for reference.     Microbiology: No results found for this or any previous visit (from the past 240 hours).   Labs: Basic Metabolic Panel: Recent Labs  Lab 09/22/23 1957 09/22/23 1959 09/22/23 2152 09/24/23 0545  NA 136 137  --  137  K 3.1* 3.0*  --  3.1*  CL 106 103  --  112*  CO2 20*  --   --  22  GLUCOSE 118* 124*  --  123*  BUN 21 20  --  12  CREATININE 1.02 0.90  --  1.03  CALCIUM  8.6*  --   --  8.0*  MG  --   --  1.7 1.8  PHOS  --   --   --  3.1   Liver Function Tests: Recent Labs  Lab 09/22/23 1957 09/24/23 0545  AST 22 18  ALT 13 11  ALKPHOS 86 83  BILITOT 0.4 1.2  PROT 6.8 6.2*  ALBUMIN 2.7* 2.6*   CBC: Recent Labs  Lab 09/22/23 1957 09/22/23 1959 09/24/23 0545  WBC 6.1  --  7.6  NEUTROABS 4.1  --   --   HGB 8.8* 9.2* 9.1*  HCT 27.3* 27.0* 27.8*  MCV 105.4*  --  102.2*  PLT 100*  --  132*   CBG: Recent Labs  Lab 09/22/23 1944  GLUCAP 132*   Hgb A1c No results for input(s): HGBA1C in the  last 72 hours. Lipid Profile No results for input(s): CHOL, HDL, LDLCALC, TRIG, CHOLHDL, LDLDIRECT in the last 72 hours. Thyroid  function studies No results for input(s): TSH, T4TOTAL, T3FREE, THYROIDAB in the last 72 hours.  Invalid input(s): FREET3 Urinalysis    Component Value Date/Time   COLORURINE STRAW (A) 09/22/2023 2048   APPEARANCEUR CLEAR 09/22/2023 2048   LABSPEC 1.015 09/22/2023 2048   PHURINE 7.0 09/22/2023 2048   GLUCOSEU NEGATIVE 09/22/2023 2048   HGBUR NEGATIVE 09/22/2023 2048   BILIRUBINUR NEGATIVE 09/22/2023 2048   KETONESUR NEGATIVE 09/22/2023 2048   PROTEINUR NEGATIVE 09/22/2023 2048   UROBILINOGEN 0.2 07/13/2014 1804   NITRITE NEGATIVE 09/22/2023 2048   LEUKOCYTESUR NEGATIVE 09/22/2023 2048    FURTHER DISCHARGE INSTRUCTIONS:   Get Medicines reviewed and adjusted: Please take all your medications with you for your next visit with your Primary MD   Laboratory/radiological data: Please request your Primary MD to go over all hospital tests and procedure/radiological results at the follow up, please ask your Primary MD to get all Hospital records sent to his/her office.   In some cases, they will be blood work, cultures and biopsy results pending at the time of your discharge. Please request that your primary care M.D. goes through all the records of your hospital data and follows up on these results.   Also Note the following: If you experience worsening of your admission symptoms, develop shortness of breath, life threatening emergency, suicidal or homicidal thoughts you must seek medical attention immediately by calling 911 or calling your MD immediately  if symptoms less severe.   You must read complete instructions/literature along with all the possible adverse reactions/side effects for all the Medicines you take and that have been prescribed to you. Take any new Medicines after you have completely understood and accpet all the possible  adverse reactions/side effects.  Do not drive when taking Pain medications or sleeping medications (Benzodaizepines)   Do not take more than prescribed Pain, Sleep and Anxiety Medications. It is not advisable to combine anxiety,sleep and pain medications without talking with your primary care practitioner   Special Instructions: If you have smoked or chewed Tobacco  in the last 2 yrs please stop smoking, stop any regular Alcohol   and or any Recreational drug use.   Wear Seat belts while driving.   Please note: You were cared for by a hospitalist during your hospital stay. Once you are discharged, your primary care physician will handle any further medical issues. Please note that NO REFILLS for any discharge medications will be authorized once you are discharged, as it is imperative that you return to your primary care physician (or establish a relationship with a primary care physician if you do not have one) for your post hospital discharge needs so that they can reassess your need for medications and monitor your lab values.  Time coordinating discharge: 35 minutes  SIGNED:  Nilda Fendt, MD, PhD 09/26/2023, 9:03 AM

## 2023-09-26 NOTE — Progress Notes (Signed)
 Speech Language Pathology Treatment: Dysphagia  Patient Details Name: Evan [name] MRN: [id number] DOB: 1927-06-11 Today's Date: 09/26/2023 Time: 0910-0919 SLP Time Calculation (min) (ACUTE ONLY): 9 min  Assessment / Plan / Recommendation Clinical Impression  Son feeding pt in a semi reclined position when SLP entered. Provided education and repositioned upright. Briefly reviewed MBS result with son and his continued risk for intermittent aspiration. Pt able to masticate Dys 3 breakfast mildly prolonged due to missing dentition. Mild oral residue on left mainly given cues to use tongue to remove however liquid wash more successful in clearing. Observed with small sips water and chocolate milk without s/s aspiration this session. Educated that pt may drink what he chooses however making sure he has water will assist in hydration and is less toxic if aspirated. Pt scheduled to be discharged today. He could have home health ST to reiterate strategies.    HPI HPI: Evan [name] is a 89 y.o. male with medical history significant of dementia, CAD status post CABG, HFpEF, CVA, hypertension, hyperlipidemia, CKD stage IIIa, COPD, PVD, history of PE on Eliquis , lung cancer, anxiety, BPH with chronic Foley, depression, esophageal ulcer, GERD, hemochromatosis, rheumatoid arthritis, chronic low back pain, chronic anemia and thrombocytopenia. Recent admission 5/11-5/19 for metapneumovirus pneumonia and heart failure exacerbation. Admitted again 5/21-5/22 for altered mental status and right upper extremity/hand cellulitis, chest congestion, SOB. Dx with acute hypoxemic respiratory failure secondary to aspiration PNA. Patient with h/o dysphagia, multiple MBS since 2020. Study in 2020 and 2024 recommending either nectar thick or thin liquids, study complete 08/2023 recommending regular solids with nectar thick liquids due to aspiration of thin liquids. MBS on 09/11/2023 indicate presbyphagia with esophageal stenosis with a  reg/thin diet rec. [facility] from home due to aphasia. MRI demonstrated small areas of acute ischemia within both superior frontal lobes; BSE recommended NPO on 6/21 d/t confusion/delayed swallow and wet vocal quality and plan for  MBS discussed with family. ST saw pt 6/22 and initiated Dys 3, tsp sips thin. MBS ordered stating family desired MBS.      SLP Plan  Continue with current plan of care;Other (Comment) (pt planning to leave today)          Recommendations  Diet recommendations: Dysphagia 3 (mechanical soft);Thin liquid Liquids provided via: No straw;Cup Medication Administration: Whole meds with puree Supervision: Staff to assist with self feeding;Full supervision/cueing for compensatory strategies Compensations: Slow rate;Small sips/bites;Lingual sweep for clearance of pocketing Postural Changes and/or Swallow Maneuvers: Seated upright 90 degrees                  Oral care BID;Staff/trained caregiver to provide oral care   Frequent or constant Supervision/Assistance Dysphagia, oropharyngeal phase (R13.12)     Continue with current plan of care;Other (Comment) (pt planning to leave today)     Dustin Olam Bull  09/26/2023, 9:24 AM

## 2023-09-26 NOTE — TOC Transition Note (Signed)
 Transition of Care [facility]) - Discharge Note   Patient Details  Name: Evan [name] MRN: [id number] Date of Birth: 19-Apr-1927  Transition of Care [facility]) CM/SW Contact:  Andrez JULIANNA George, RN Phone Number: 09/26/2023, 10:32 AM   Clinical Narrative:     Pt is discharging home with home health through [facility]. Information on the AVS.  Oxygen  for home delivered to the room. [employer] will contact the family for delivery of the concentrator.  Daughter to provide transportation home.    Barriers to Discharge: No Barriers Identified   Patient Goals and CMS Choice   CMS Medicare.gov Compare Post Acute Care list provided to:: Patient Represenative (must comment) Choice offered to / list presented to : Adult Children      Discharge Placement                       Discharge Plan and Services Additional resources added to the After Visit Summary for     Discharge Planning Services: CM Consult Post Acute Care Choice: Home Health          DME Arranged: Oxygen  DME Agency: [employer] Date DME Agency Contacted: 09/26/23   Representative spoke with at DME Agency: [facility] HH Arranged: RN, PT, OT, Nurse's Aide, Speech Therapy HH Agency: [facility] Date [facility] Agency Contacted: 09/25/23   Representative spoke with at [facility] Agency: Darleene  Social Drivers of Health (SDOH) Interventions SDOH Screenings   Food Insecurity: No Food Insecurity (09/23/2023)  Housing: Low Risk  (09/23/2023)  Transportation Needs: No Transportation Needs (09/23/2023)  Utilities: Not At Risk (09/23/2023)  Alcohol  Screen: Low Risk  (04/20/2023)  Depression (PHQ2-9): High Risk (07/10/2023)  Financial Resource Strain: Low Risk  (04/20/2023)  Physical Activity: Insufficiently Active (04/20/2023)  Social Connections: Socially Isolated (09/23/2023)  Stress: No Stress Concern Present (04/20/2023)  Tobacco Use: Medium Risk (09/23/2023)  Health Literacy: Inadequate Health Literacy (04/20/2023)     Readmission Risk  Interventions    08/15/2023    4:46 PM 03/10/2023    2:24 PM 05/17/2022    1:25 PM  Readmission Risk Prevention Plan  Transportation Screening Complete Complete Complete  PCP or Specialist Appt within 3-5 Days Complete    HRI or Home Care Consult Complete    Palliative Care Screening Not Applicable    Medication Review (RN Care Manager) Complete Complete Complete  PCP or Specialist appointment within 3-5 days of discharge  Complete Complete  HRI or Home Care Consult  Complete Complete  SW Recovery Care/Counseling Consult  Complete   Palliative Care Screening  Not Applicable   Skilled Nursing Facility  Not Applicable

## 2023-09-27 ENCOUNTER — Telehealth: Payer: Self-pay | Admitting: *Deleted

## 2023-09-27 NOTE — Transitions of Care (Post Inpatient/ED Visit) (Signed)
 09/27/2023  Name: Evan [name] MRN: [id number] DOB: [birth year]  Today's TOC FU Call Status: Today's TOC FU Call Status:: Successful TOC FU Call Completed TOC FU Call Complete Date: 09/27/23 Patient's Name and Date of Birth confirmed.  Transition Care Management Follow-up Telephone Call Date of Discharge: 09/26/23 Discharge Facility: [name]) Type of Discharge: Inpatient Admission Primary Inpatient Discharge Diagnosis:: acute CVA How have you been since you were released from the hospital?:  (no issues with bowel/ bladder, eating well, has good family support) Any questions or concerns?: No  Items Reviewed: Did you receive and understand the discharge instructions provided?: Yes Medications obtained,verified, and reconciled?: Yes (Medications Reviewed) Any new allergies since your discharge?: No Dietary orders reviewed?: Yes Type of Diet Ordered:: dysphagia 3,  thick it liquids Do you have support at home?: Yes People in Home [RPT]: child(ren), adult Name of Support/Comfort Primary Source: Evan [name]  Medications Reviewed Today: Medications Reviewed Today     Reviewed by Aura Mliss LABOR, RN (Registered Nurse) on 09/27/23 at [phone]  Med List Status: <None>   Medication Order Taking? Sig Documenting Provider Last Dose Status Informant  acetaminophen  (TYLENOL ) 325 MG tablet [id number] Yes Take 325-650 mg by mouth 2 (two) times daily as needed for mild pain (pain score 1-3), moderate pain (pain score 4-6), fever or headache. [provider]  Active Child, Pharmacy Records  amoxicillin -clavulanate (AUGMENTIN ) 400-57 MG/5ML suspension [id number] Yes Take 10.9 mLs (875 mg total) by mouth 2 (two) times daily for 5 days. Gherghe, Costin M, MD  Active   apixaban  (ELIQUIS ) 2.5 MG TABS tablet [id number] Yes Take 1 tablet (2.5 mg total) by mouth 2 (two) times daily. Wyn Jackee VEAR Mickey., NP  Active Child, Pharmacy Records  budesonide -formoterol  (SYMBICORT ) 160-4.5 MCG/ACT inhaler  [id number] Yes Inhale 2 puffs into the lungs in the morning and at bedtime. [name], MD  Active Child, Pharmacy Records  ferrous sulfate  [facility]) 325 (65 FE) MG tablet [id number] Yes TAKE 1 TABLET EVERY DAY [name], MD  Active Child, Pharmacy Records  FLUoxetine  (PROZAC ) 20 MG capsule [id number] Yes TAKE 1 CAPSULE EVERY DAY [name], MD  Active Child, Pharmacy Records  folic acid  (FOLVITE ) 1 MG tablet [id number] Yes Take 1 tablet (1 mg total) by mouth daily. [name], [name], MD  Active Child, Pharmacy Records  furosemide  (LASIX ) 40 MG tablet [id number] Yes TAKE 1 TABLET EVERY DAY [name], MD  Active Child, Pharmacy Records  hydrOXYzine  (ATARAX ) 10 MG/5ML syrup [id number] Yes Take 5 mLs (10 mg total) by mouth every 8 (eight) hours as needed for itching. [name], [name], MD  Active Child, Pharmacy Records  melatonin 3 MG TABS tablet [id number] Yes Take 1 tablet (3 mg total) by mouth at bedtime as needed (insomnia). [name], [name], MD  Active Child, Pharmacy Records  metoprolol  tartrate (LOPRESSOR ) 25 MG tablet [id number] Yes Take 25 mg by mouth daily. [provider]  Active Child, Pharmacy Records  Multiple Vitamins-Minerals (MENS 50+ MULTIVITAMIN) TABS [id number] Yes Take 1 tablet by mouth daily. [provider]  Active Child, Pharmacy Records  mupirocin  ointment (BACTROBAN ) 2 % [id number] Yes Apply 1 Application topically 2 (two) times daily. [name], [name], DPM  Active Child, Pharmacy Records  nitroGLYCERIN  (NITROSTAT ) 0.4 MG SL tablet [id number]  Place 1 tablet (0.4 mg total) under the tongue every 5 (five) minutes as needed for chest pain.  Patient not taking: Reported on 09/27/2023   [name], MD  Active Child, Pharmacy Records           Med Note [name], [name]   Sat Sep 09, 2023 10:35 PM)    nystatin  (MYCOSTATIN /NYSTOP ) powder [id number] Yes Apply a thin layer topically to the affected area 2-3 (two to three) times  daily.   Active Child, Pharmacy Records  pantoprazole  (PROTONIX ) 40 MG tablet [id number] Yes TAKE 1 TABLET EVERY MORNING [name], MD  Active Child, Pharmacy Records  rosuvastatin  (CRESTOR ) 5 MG tablet [id number] Yes Take 1 tablet (5 mg total) by mouth at bedtime. [name], [name], MD  Active Child, Pharmacy Records  senna-docusate (STIMULANT LAXATIVE) 8.6-50 MG tablet [id number] Yes Take 2 tablets by mouth 2 (two) times daily as needed for constipation   Active Child, Pharmacy Records  tamsulosin  (FLOMAX ) 0.4 MG CAPS capsule [id number] Yes Take 1 capsule (0.4 mg total) by mouth daily with supper. [name], MD  Active Child, Pharmacy Records  Zinc  Oxide (TRIPLE PASTE) 12.8 % ointment [id number] Yes Apply topically 4 (four) times daily as needed (skin barrier). [name]., MD  Active Child, Pharmacy Records            Home Care and Equipment/Supplies: Were Home Health Services Ordered?: Yes Name of Home Health Agency:: [facility] Has Agency set up a time to come to your home?: Yes First Home Health Visit Date: 09/26/23 (yesterday evening) Any new equipment or medical supplies ordered?: Yes Name of Medical supply agency?: oxygen  at 2 liters via [facility] supplied by [employer] Were you able to get the equipment/medical supplies?: Yes Do you have any questions related to the use of the equipment/supplies?: No  Functional Questionnaire: Do you need assistance with bathing/showering or dressing?: Yes (daughter assists) Do you need assistance with meal preparation?: Yes (daughter assists) Do you need assistance with eating?: No Do you have difficulty maintaining continence: Yes (foley catheter - long term) Do you need assistance with getting out of bed/getting out of a chair/moving?: Yes (WC, walker able to pivot) Do you have difficulty managing or taking your medications?: Yes (daughter assists)  Follow up appointments reviewed: PCP Follow-up appointment confirmed?: No  (daughter declines for RN CM to schedule appointment, states difficult to get pt out of the house, she will call and schedule) MD Provider Line Number:[phone] Given: No Specialist Hospital Follow-up appointment confirmed?: No Reason Specialist Follow-Up Not Confirmed: Patient has Specialist Provider Number and will Call for Appointment Do you need transportation to your follow-up appointment?: No Do you understand care options if your condition(s) worsen?: Yes-patient verbalized understanding  SDOH Interventions Today    Flowsheet Row Most Recent Value  SDOH Interventions   Food Insecurity Interventions Intervention Not Indicated  Housing Interventions Intervention Not Indicated  Transportation Interventions Intervention Not Indicated  Utilities Interventions Intervention Not Indicated    Goals Addressed             This Visit's Progress    VBCI Transitions of Care (TOC) Care Plan       Problems:  Recent Hospitalization for treatment of Acute CVA No Hospital Follow Up Provider appointment patient's daughter declines for RN CM to schedule appointment, states she will make the appointment to accommodate her schedule and when pt is better able to leave home and No Specialist appointment daughter verbalizes understanding of and has contact # for Stroke Clinic and will call and schedule appointment to be seen within 4-6 weeks  Goal:  Over the next 30 days, the patient will not experience hospital  readmission  Interventions:  Stroke: Reviewed Importance of taking all medications as prescribed Reviewed Importance of attending all scheduled provider appointments Advised patient to discuss any change in heath status/ symptoms with provide Assessed social determinant of health barriers Assessed for signs and symptoms of stroke Reviewed referrals to home health Assessed for cognitive impairment Assessed for fall status and safety in the home Reviewed safety precautions Reviewed  signs/ symptoms of infection Reviewed upcoming scheduled appointments,  social worker 10/03/23 @ 10 am Reviewed importance of working with home health RN/ PT Reviewed dysphagia 3 diet, aspiration precautions Encouraged daughter to assist pt to change positions q 2 hours to avoid skin break down Reviewed oxygen  safety Routed today's note to primary care provider  Patient Self Care Activities:  Attend all scheduled provider appointments Call pharmacy for medication refills 3-7 days in advance of running out of medications Call provider office for new concerns or questions  Notify RN Care Manager of TOC call rescheduling needs Participate in Transition of Care Program/Attend TOC scheduled calls Take medications as prescribed   Work with the social worker to address care coordination needs and will continue to work with the clinical team to address health care and disease management related needs  Plan:  Telephone follow up appointment with care management team member scheduled for:  10/04/23 @ 1015 am The patient has been provided with contact information for the care management team and has been advised to call with any health related questions or concerns.         Mliss [facility], BSN RN Care Manager/ Transition of Care [place]/ [facility] [phone]

## 2023-09-28 ENCOUNTER — Other Ambulatory Visit (HOSPITAL_COMMUNITY): Payer: Self-pay

## 2023-09-29 ENCOUNTER — Telehealth: Payer: Self-pay

## 2023-09-29 DIAGNOSIS — N1832 Chronic kidney disease, stage 3b: Secondary | ICD-10-CM | POA: Diagnosis not present

## 2023-09-29 DIAGNOSIS — D631 Anemia in chronic kidney disease: Secondary | ICD-10-CM | POA: Diagnosis not present

## 2023-09-29 DIAGNOSIS — I5033 Acute on chronic diastolic (congestive) heart failure: Secondary | ICD-10-CM | POA: Diagnosis not present

## 2023-09-29 DIAGNOSIS — J449 Chronic obstructive pulmonary disease, unspecified: Secondary | ICD-10-CM | POA: Diagnosis not present

## 2023-09-29 DIAGNOSIS — F0394 Unspecified dementia, unspecified severity, with anxiety: Secondary | ICD-10-CM | POA: Diagnosis not present

## 2023-09-29 DIAGNOSIS — I13 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 failure and stage 1 through stage 4 chronic kidney disease, or unspecified chronic kidney disease: Secondary | ICD-10-CM | POA: Diagnosis not present

## 2023-09-29 DIAGNOSIS — J9601 Acute respiratory failure with hypoxia: Secondary | ICD-10-CM | POA: Diagnosis not present

## 2023-09-29 DIAGNOSIS — F0393 Unspecified dementia, unspecified severity, with mood disturbance: Secondary | ICD-10-CM | POA: Diagnosis not present

## 2023-09-29 DIAGNOSIS — J690 Pneumonitis due to inhalation of food and vomit: Secondary | ICD-10-CM | POA: Diagnosis not present

## 2023-09-29 NOTE — Telephone Encounter (Signed)
 Copied from CRM [id number]. Topic: Clinical - Order For Equipment >> Sep 29, 2023  8:48 AM Antwanette L wrote: Reason for CRM: Patient son stoklosa) is who is the patient oldest son is calling to see if Dr. Duanne can order a lift that will transport the patient from his bed to his chair. The patient cannot move according to his son. Rondall can be contacted at [phone]

## 2023-10-02 ENCOUNTER — Encounter: Payer: Self-pay | Admitting: Podiatry

## 2023-10-02 ENCOUNTER — Ambulatory Visit: Admitting: Podiatry

## 2023-10-02 DIAGNOSIS — B351 Tinea unguium: Secondary | ICD-10-CM | POA: Diagnosis not present

## 2023-10-02 DIAGNOSIS — Z7901 Long term (current) use of anticoagulants: Secondary | ICD-10-CM

## 2023-10-02 DIAGNOSIS — M79674 Pain in right toe(s): Secondary | ICD-10-CM

## 2023-10-02 DIAGNOSIS — M79675 Pain in left toe(s): Secondary | ICD-10-CM

## 2023-10-02 NOTE — Progress Notes (Signed)
 He presents today chief concern of painful elongated toenails.  Objective: Toenails are long thick yellow dystrophic clinical mycotic painful on palpation.  Assessment: Pain in limb secondary to onychomycosis.  Plan: Debridement of toenails 1 through 5 bilateral.

## 2023-10-03 ENCOUNTER — Telehealth: Payer: Self-pay

## 2023-10-03 ENCOUNTER — Other Ambulatory Visit: Payer: Self-pay | Admitting: Licensed Clinical Social Worker

## 2023-10-03 ENCOUNTER — Other Ambulatory Visit: Payer: Self-pay

## 2023-10-03 DIAGNOSIS — I10 Essential (primary) hypertension: Secondary | ICD-10-CM

## 2023-10-03 DIAGNOSIS — R531 Weakness: Secondary | ICD-10-CM

## 2023-10-03 DIAGNOSIS — I5022 Chronic systolic (congestive) heart failure: Secondary | ICD-10-CM

## 2023-10-03 DIAGNOSIS — G4700 Insomnia, unspecified: Secondary | ICD-10-CM

## 2023-10-03 DIAGNOSIS — D649 Anemia, unspecified: Secondary | ICD-10-CM

## 2023-10-03 DIAGNOSIS — R5383 Other fatigue: Secondary | ICD-10-CM

## 2023-10-03 MED ORDER — MELATONIN 3 MG PO TABS: 3.0000 mg | ORAL_TABLET | Freq: Every evening | ORAL | 1 refills | Status: DC | PRN

## 2023-10-03 MED ORDER — FOLIC ACID 1 MG PO TABS
1.0000 mg | ORAL_TABLET | Freq: Every day | ORAL | 1 refills | Status: DC
Start: 2023-10-03 — End: 2024-01-22

## 2023-10-03 NOTE — Telephone Encounter (Signed)
 Spoke with [place] from [facility], gave verbal orders for skilled nursing.  Will call patient's daughter to make a f/u appointment from hospital visit and to have pressure sore evaluated. Mjp,lpn HFU scheduled for 10/10/23 @ 3 pm. [name], pt's daughter aware. Mjp,lpn  Copied from CRM [id number]. Topic: Appointments - Appointment Scheduling >> Oct 03, 2023  9:00 AM Tiffany B wrote: Caller requesting orders for skilled nursing / revaluate  for 1x 3   Pressure injury on sacrum that needs to be stage, advised to clean and keep dry and use zinc  oxide throughout the day, bowel movement.

## 2023-10-04 ENCOUNTER — Other Ambulatory Visit: Payer: Self-pay | Admitting: *Deleted

## 2023-10-04 NOTE — Patient Outreach (Signed)
 Transition of Care week 2  Visit Note  10/04/2023  Name: Evan [name] MRN: [id number]          DOB: Dec 07, 1927  Situation: Patient enrolled in [facility] 30-day program. Visit completed with patient by telephone.   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   Altered mental status 08/21/2018   Anginal chest pain at rest [facility])    Chronic non, controlled on Lorazepam    Anxiety    Arthritis    all over    BPH (benign prostatic hyperplasia)    CAD (coronary artery disease)    Cellulitis of arm, right 06/01/2018   Cellulitis of left axilla    Chronic lower back pain    Complication of anesthesia    problems making water afterwards   Depression    DJD (degenerative joint disease)    Esophageal ulcer without bleeding    bid ppi indefinitely   Fall 07/16/2018   Family history of adverse reaction to anesthesia    children w/PONV   GERD (gastroesophageal reflux disease)    Haemophilus influenzae septicemia (HCC) 03/12/2023   Headache    couple/week maybe (09/04/2015)   Hemochromatosis    Possible, elevated iron  stores, hook-like osteophytes on hand films, normal LFTs   Hepatitis    yellow jaundice as a baby   History of ASCVD    MULTIVESSEL   Hyperlipidemia    Hypertension    Hypotension    Mild aortic stenosis 06/23/2021   Osteoarthritis    Peripheral vascular disease (HCC)    Pneumonia    several times; got a little now (09/04/2015)   Pulmonary embolism (HCC) 02/02/2018   Rash and nonspecific skin eruption 06/01/2018   Rheumatoid arthritis (HCC)    RSV (respiratory syncytial virus pneumonia) 05/15/2022   Stroke (HCC)    Subdural hematoma (HCC)    small after fall 09/2016-plavix  held, neurosurgery consulted.  Asymptomatic.      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 PO Eliquis     Assessment: Patient Reported Symptoms: Cognitive Cognitive Status: No symptoms reported, Able to follow simple commands      Neurological Neurological Review of Symptoms: No symptoms reported    HEENT HEENT Symptoms Reported: No symptoms reported      Cardiovascular Cardiovascular Symptoms Reported: No symptoms reported    Respiratory Respiratory Symptoms Reported: No symptoms reported    Endocrine Endocrine Symptoms Reported: No symptoms reported    Gastrointestinal Gastrointestinal Symptoms Reported: No symptoms reported      Genitourinary Genitourinary Symptoms Reported: No symptoms reported Additional Genitourinary Details: foley catheter patent, draining well, no concerns reported,  HH RN continues managing foley cathter with monthly changes Genitourinary Management Strategies: Catheter, indwelling Genitourinary Self-Management Outcome: 3 (uncertain)  Integumentary Integumentary Symptoms Reported: Other Other Integumentary Symptoms: redness to bottom - per daughter - no open areas, only redness ,   MD will be assessing,  home health also,  zinc  oxide being applied Skin Management Strategies: Routine screening Skin Self-Management Outcome: 3 (uncertain)  Musculoskeletal Musculoskelatal Symptoms Reviewed: Difficulty walking Additional Musculoskeletal Details: WC, walker   working with home health PT Musculoskeletal Management Strategies: Routine screening, Adequate rest Musculoskeletal Self-Management Outcome: 3 (uncertain)      Psychosocial Psychosocial Symptoms Reported: No symptoms reported Other Psychosocial Conditions:  pt has history of anxiety         There were no vitals filed for this visit.  Medications Reviewed Today     Reviewed by Aura Mliss LABOR, RN (Registered Nurse) on 10/04/23 at 1049  Med List Status: <None>   Medication Order Taking? Sig Documenting Provider Last  Dose Status Informant  acetaminophen  (TYLENOL ) 325 MG tablet [id number]  Take 325-650 mg by mouth 2 (two) times daily as needed for mild pain (pain score 1-3), moderate pain (pain score 4-6), fever or headache. [provider]  Active Child, Pharmacy Records  apixaban  (ELIQUIS ) 2.5 MG TABS tablet [id number]  Take 1 tablet (2.5 mg total) by mouth 2 (two) times daily. Wyn Jackee VEAR Mickey., NP  Active Child, Pharmacy Records  budesonide -formoterol  (SYMBICORT ) 160-4.5 MCG/ACT inhaler [id number]  Inhale 2 puffs into the lungs in the morning and at bedtime. [name], MD  Active Child, Pharmacy Records  ferrous sulfate  [facility]) 325 (65 FE) MG tablet [id number]  TAKE 1 TABLET EVERY DAY [name], MD  Active Child, Pharmacy Records  FLUoxetine  (PROZAC ) 20 MG capsule [id number]  TAKE 1 CAPSULE EVERY DAY [name], MD  Active Child, Pharmacy Records  folic acid  (FOLVITE ) 1 MG tablet [id number]  Take 1 tablet (1 mg total) by mouth daily. [name], MD  Active   furosemide  (LASIX ) 40 MG tablet [id number]  TAKE 1 TABLET EVERY DAY [name], MD  Active Child, Pharmacy Records  hydrOXYzine  (ATARAX ) 10 MG/5ML syrup [id number]  Take 5 mLs (10 mg total) by mouth every 8 (eight) hours as needed for itching. [name], [name], MD  Active Child, Pharmacy Records  melatonin 3 MG TABS tablet [id number]  Take 1 tablet (3 mg total) by mouth at bedtime as needed (insomnia). [name], MD  Active   metoprolol  tartrate (LOPRESSOR ) 25 MG tablet [id number]  Take 25 mg by mouth daily. [provider]  Active Child, Pharmacy Records  Multiple Vitamins-Minerals (MENS 50+ MULTIVITAMIN) TABS [id number]  Take 1 tablet by mouth daily. [provider]  Active Child, Pharmacy Records  mupirocin  ointment (BACTROBAN ) 2 % [id number]  Apply 1 Application topically 2 (two) times daily. Hyatt, [name], DPM  Active Child, Pharmacy Records  nitroGLYCERIN  (NITROSTAT ) 0.4 MG SL tablet  [id number]  Place 1 tablet (0.4 mg total) under the tongue every 5 (five) minutes as needed for chest pain.  Patient not taking: Reported on 09/27/2023   [name], MD  Active Child, Pharmacy Records           Med Note [place], [name]   Dju Sep 09, 2023 10:35 PM)    nystatin  (MYCOSTATIN /NYSTOP ) powder [id number]  Apply a thin layer topically to the affected area 2-3 (two to three) times daily.   Active Child, Pharmacy Records  pantoprazole  (PROTONIX ) 40 MG tablet [id number]  TAKE 1 TABLET EVERY MORNING [name], MD  Active Child, Pharmacy Records  rosuvastatin  (CRESTOR ) 5 MG tablet [id number]  Take 1 tablet (5 mg total) by mouth at bedtime. [name], [name], MD  Active Child, Pharmacy Records  senna-docusate (STIMULANT LAXATIVE) 8.6-50 MG tablet [id number]  Take 2 tablets by mouth 2 (two) times daily as needed for constipation   Active Child, Pharmacy Records  tamsulosin  (FLOMAX ) 0.4 MG CAPS capsule [id number]  Take 1 capsule (0.4 mg total) by mouth daily with supper. [name], MD  Active Child, Pharmacy Records  Zinc  Oxide (TRIPLE PASTE) 12.8 % ointment [id number]  Apply topically 4 (four) times daily as needed (skin barrier). [name]., MD  Active Child, Pharmacy Records            Goals Addressed             This Visit's Progress    [facility] Transitions of Care [facility]) Care Plan       Problems:  Recent Hospitalization for treatment of Acute CVA No Hospital Follow Up Provider appointment patient's daughter declines for RN CM to schedule appointment, states she will make the appointment to accommodate her schedule and when pt is better able to leave home and No Specialist appointment daughter verbalizes understanding of and has contact # for Stroke Clinic and will call and schedule appointment to be seen within 4-6 weeks 10/04/23-spoke with daughter, overall pt doing well, saw podiatrist on 6/30 and this wore him out, difficult to get pt out of home,  Home  health PT, RN continues working with pt, pt has redness to bottom area (lower cheek areas), no open areas, MD to assess on 10/10/23  Goal:  Over the next 30 days, the patient will not experience hospital readmission  Interventions:  Stroke: Reviewed Importance of taking all medications as prescribed Reviewed Importance of attending all scheduled provider appointments Advised patient to discuss any change in heath status/ symptoms with provide Assessed for signs and symptoms of stroke Reviewed signs/ symptoms of stroke Reviewed safety precautions Reviewed signs/ symptoms of infection Reviewed upcoming scheduled appointments including primary care provider 10/10/23 Reviewed importance of working with home health RN/ PT Reviewed dysphagia 3 diet, aspiration precautions Reinforced with daughter to assist pt to change positions q 2 hours and adequate protein in diet to avoid skin break down Reinforced oxygen  safety Reviewed care of foley catheter, signs/ symptoms of infection  Patient Self Care Activities:  Attend all scheduled provider appointments Call pharmacy for medication refills 3-7 days in advance of running out of medications Call provider office for new concerns or questions  Notify RN Care Manager of [facility] call rescheduling needs Participate in Transition of Care Program/Attend TOC scheduled calls Take medications as prescribed   Work with the social worker to address care coordination needs and will continue to work with the clinical team to address health care and disease management related needs Change positions every 2 hours  Plan:  Telephone follow up appointment with care management team member scheduled for:  10/11/23 @ 11 am The patient has been provided with contact information for the care management team and has been advised to call with any health related questions or concerns.          Recommendation:   PCP Follow-up Change positions every 2 hours Keep skin clean  and dry  Follow Up Plan:   Telephone follow-up 10/11/23 @ 11 am  Mliss Creed [facility], BSN RN Care Manager/ Transition of Care [place]/ [facility] [phone]

## 2023-10-04 NOTE — Patient Instructions (Signed)
 Visit Information  Thank you for taking time to visit with me today. Please don't hesitate to contact me if I can be of assistance to you before our next scheduled telephone appointment.  Our next appointment is by telephone on 10/11/23 @ 11 am  Following is a copy of your care plan:   Goals Addressed             This Visit's Progress    VBCI Transitions of Care (TOC) Care Plan       Problems:  Recent Hospitalization for treatment of Acute CVA No Hospital Follow Up Provider appointment patient's daughter declines for RN CM to schedule appointment, states she will make the appointment to accommodate her schedule and when pt is better able to leave home and No Specialist appointment daughter verbalizes understanding of and has contact # for Stroke Clinic and will call and schedule appointment to be seen within 4-6 weeks 10/04/23-spoke with daughter, overall pt doing well, saw podiatrist on 6/30 and this wore him out, difficult to get pt out of home,  Home health PT, RN continues working with pt, pt has redness to bottom area (lower cheek areas), no open areas, MD to assess on 10/10/23  Goal:  Over the next 30 days, the patient will not experience hospital readmission  Interventions:  Stroke: Reviewed Importance of taking all medications as prescribed Reviewed Importance of attending all scheduled provider appointments Advised patient to discuss any change in heath status/ symptoms with provide Assessed for signs and symptoms of stroke Reviewed signs/ symptoms of stroke Reviewed safety precautions Reviewed signs/ symptoms of infection Reviewed upcoming scheduled appointments including primary care provider 10/10/23 Reviewed importance of working with home health RN/ PT Reviewed dysphagia 3 diet, aspiration precautions Reinforced with daughter to assist pt to change positions q 2 hours and adequate protein in diet to avoid skin break down Reinforced oxygen  safety Reviewed care of foley  catheter, signs/ symptoms of infection  Patient Self Care Activities:  Attend all scheduled provider appointments Call pharmacy for medication refills 3-7 days in advance of running out of medications Call provider office for new concerns or questions  Notify RN Care Manager of TOC call rescheduling needs Participate in Transition of Care Program/Attend TOC scheduled calls Take medications as prescribed   Work with the social worker to address care coordination needs and will continue to work with the clinical team to address health care and disease management related needs Change positions every 2 hours  Plan:  Telephone follow up appointment with care management team member scheduled for:  10/11/23 @ 11 am The patient has been provided with contact information for the care management team and has been advised to call with any health related questions or concerns.         Patient verbalizes understanding of instructions and care plan provided today and agrees to view in MyChart. Active MyChart status and patient understanding of how to access instructions and care plan via MyChart confirmed with patient.     Telephone follow up appointment with care management team member scheduled for: 10/11/23 @ 11 am  Please call the care guide team at [phone] if you need to cancel or reschedule your appointment.   Please call the Suicide and Crisis Lifeline: 988 call the [place]  National Suicide Prevention Lifeline: (610) 516-7994 or TTY: (660)718-4819 TTY 534-556-9078) to talk to a trained counselor call 1-800-273-TALK (toll free, 24 hour hotline) go to [facility] [address] [place] [phone]) call  911 if you are experiencing a Mental Health or Behavioral Health Crisis or need someone to talk to.  Mliss Creed [facility], BSN RN Care Manager/ Transition of Care [place]/ [facility] [phone]

## 2023-10-05 DIAGNOSIS — D631 Anemia in chronic kidney disease: Secondary | ICD-10-CM | POA: Diagnosis not present

## 2023-10-05 DIAGNOSIS — J449 Chronic obstructive pulmonary disease, unspecified: Secondary | ICD-10-CM | POA: Diagnosis not present

## 2023-10-05 DIAGNOSIS — F0393 Unspecified dementia, unspecified severity, with mood disturbance: Secondary | ICD-10-CM | POA: Diagnosis not present

## 2023-10-05 DIAGNOSIS — J9601 Acute respiratory failure with hypoxia: Secondary | ICD-10-CM | POA: Diagnosis not present

## 2023-10-05 DIAGNOSIS — I13 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 failure and stage 1 through stage 4 chronic kidney disease, or unspecified chronic kidney disease: Secondary | ICD-10-CM | POA: Diagnosis not present

## 2023-10-05 DIAGNOSIS — N1832 Chronic kidney disease, stage 3b: Secondary | ICD-10-CM | POA: Diagnosis not present

## 2023-10-05 DIAGNOSIS — F0394 Unspecified dementia, unspecified severity, with anxiety: Secondary | ICD-10-CM | POA: Diagnosis not present

## 2023-10-05 DIAGNOSIS — I5033 Acute on chronic diastolic (congestive) heart failure: Secondary | ICD-10-CM | POA: Diagnosis not present

## 2023-10-05 DIAGNOSIS — J690 Pneumonitis due to inhalation of food and vomit: Secondary | ICD-10-CM | POA: Diagnosis not present

## 2023-10-05 NOTE — Telephone Encounter (Signed)
 Copied from CRM [id number]. Topic: Clinical - Home Health Verbal Orders >> Oct 05, 2023 12:05 PM Charlet HERO wrote: Caller/Agency: [facility]/ [facility] Callback Number: [phone] Service Requested: wound care Frequency: wound cleanser, xero form bandage once daily and as needed Any new concerns about the patient? No

## 2023-10-05 NOTE — Patient Outreach (Signed)
 Complex Care Management   Visit Note  10/03/2023  Name:  Evan [name] MRN: [id number] DOB: January 30, 1928  Situation: Referral received for Complex Care Management related to Mental/Behavioral Health diagnosis Anxiety I obtained verbal consent from Patient.  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   Altered mental status 08/21/2018   Anginal chest pain at rest [facility])    Chronic non, controlled on Lorazepam    Anxiety    Arthritis    all over    BPH (benign prostatic hyperplasia)    CAD (coronary artery disease)    Cellulitis of arm, right 06/01/2018   Cellulitis of left axilla    Chronic lower back pain    Complication of anesthesia    problems making water afterwards   Depression    DJD (degenerative joint disease)    Esophageal ulcer without bleeding    bid ppi indefinitely   Fall 07/16/2018   Family history of adverse reaction to anesthesia    children w/PONV   GERD (gastroesophageal reflux disease)    Haemophilus influenzae septicemia (HCC) 03/12/2023   Headache    couple/week maybe (09/04/2015)   Hemochromatosis    Possible, elevated iron  stores, hook-like osteophytes on hand films, normal LFTs   Hepatitis    yellow jaundice as a baby   History of ASCVD    MULTIVESSEL   Hyperlipidemia    Hypertension    Hypotension    Mild aortic stenosis 06/23/2021   Osteoarthritis    Peripheral vascular disease (HCC)    Pneumonia    several times; got a little now (09/04/2015)   Pulmonary embolism (HCC) 02/02/2018   Rash and nonspecific skin eruption 06/01/2018   Rheumatoid arthritis (HCC)    RSV (respiratory syncytial virus pneumonia) 05/15/2022   Stroke (HCC)    Subdural hematoma (HCC)    small  after fall 09/2016-plavix  held, neurosurgery consulted.  Asymptomatic.     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 PO Eliquis     Assessment: Patient Reported Symptoms:  Cognitive Cognitive Status: Able to follow simple commands      Neurological Neurological Review of Symptoms: Not assessed    HEENT HEENT Symptoms Reported: Not assessed      Cardiovascular Cardiovascular Symptoms Reported: Not assessed    Respiratory Respiratory Symptoms Reported: Not assesed    Endocrine Endocrine Symptoms Reported: Not assessed    Gastrointestinal Gastrointestinal Symptoms Reported: Not assessed      Genitourinary Genitourinary Symptoms Reported: Not assessed    Integumentary Integumentary Symptoms Reported: Wound Additional Integumentary Details: Pt's daughter reports pt has an upcoming PCP appt to assess pressure sore. HH RN is assisting with treating Skin Management Strategies: Routine screening  Musculoskeletal Musculoskelatal Symptoms Reviewed: Not assessed        Psychosocial Psychosocial Symptoms Reported: Anxiety - if selected complete GAD Additional Psychological Details: Pt's daughter reports pt exhibitted minimal anxiety due to limited mobility. Strategies discussed to assist with anxiety symptoms Behavioral Management Strategies: Adequate rest, Coping strategies Major Change/Loss/Stressor/Fears (CP): Medical condition, self Quality of Family Relationships: helpful, involved, supportive      07/10/2023   12:11 PM  Depression screen PHQ 2/9  Decreased Interest 3  Down, Depressed, Hopeless 3  PHQ - 2 Score  6  Altered sleeping 0  Tired, decreased energy 2  Change in appetite 0  Feeling bad or failure about yourself  1  Trouble concentrating 1  Moving slowly or fidgety/restless 1  Suicidal thoughts 1  PHQ-9 Score 12    There were no vitals filed for this visit.  Medications Reviewed Today     Reviewed by Ezzard Rolin BIRCH, LCSW (Social  Worker) on 10/03/23 at 1022  Med List Status: <None>   Medication Order Taking? Sig Documenting Provider Last Dose Status Informant  acetaminophen  (TYLENOL ) 325 MG tablet [id number]  Take 325-650 mg by mouth 2 (two) times daily as needed for mild pain (pain score 1-3), moderate pain (pain score 4-6), fever or headache. [provider]  Active Child, Pharmacy Records  apixaban  (ELIQUIS ) 2.5 MG TABS tablet [id number]  Take 1 tablet (2.5 mg total) by mouth 2 (two) times daily. Wyn Jackee VEAR Mickey., NP  Active Child, Pharmacy Records  budesonide -formoterol  (SYMBICORT ) 160-4.5 MCG/ACT inhaler [id number]  Inhale 2 puffs into the lungs in the morning and at bedtime. [name], MD  Active Child, Pharmacy Records  ferrous sulfate  [facility]) 325 (65 FE) MG tablet [id number]  TAKE 1 TABLET EVERY DAY [name], MD  Active Child, Pharmacy Records  FLUoxetine  (PROZAC ) 20 MG capsule [id number]  TAKE 1 CAPSULE EVERY DAY [name], MD  Active Child, Pharmacy Records  folic acid  (FOLVITE ) 1 MG tablet [id number]  Take 1 tablet (1 mg total) by mouth daily. [name], [name], MD  Active Child, Pharmacy Records  furosemide  (LASIX ) 40 MG tablet [id number]  TAKE 1 TABLET EVERY DAY [name], MD  Active Child, Pharmacy Records  hydrOXYzine  (ATARAX ) 10 MG/5ML syrup [id number]  Take 5 mLs (10 mg total) by mouth every 8 (eight) hours as needed for itching. [name], [name], MD  Active Child, Pharmacy Records  melatonin 3 MG TABS tablet [id number]  Take 1 tablet (3 mg total) by mouth at bedtime as needed (insomnia). [name], [name], MD  Active Child, Pharmacy Records  metoprolol  tartrate (LOPRESSOR ) 25 MG tablet [id number]  Take 25 mg by mouth daily. [provider]  Active Child, Pharmacy Records  Multiple Vitamins-Minerals (MENS 50+ MULTIVITAMIN) TABS [id number]  Take 1 tablet by mouth daily. [provider]  Active Child, Pharmacy Records  mupirocin  ointment  (BACTROBAN ) 2 % [id number]  Apply 1 Application topically 2 (two) times daily. Hyatt, [name], DPM  Active Child, Pharmacy Records  nitroGLYCERIN  (NITROSTAT ) 0.4 MG SL tablet [id number]  Place 1 tablet (0.4 mg total) under the tongue every 5 (five) minutes as needed for chest pain.  Patient not taking: Reported on 09/27/2023   [name], MD  Active Child, Pharmacy Records           Med Note [place], [name]   Sat Sep 09, 2023 10:35 PM)    nystatin  (MYCOSTATIN /NYSTOP ) powder [id number]  Apply a thin layer topically to the affected area 2-3 (two to three) times daily.   Active Child, Pharmacy Records  pantoprazole  (PROTONIX ) 40 MG tablet [id number]  TAKE 1 TABLET EVERY MORNING [name], MD  Active Child, Pharmacy Records  rosuvastatin  (CRESTOR ) 5 MG tablet [id number]  Take 1 tablet (5 mg total) by mouth at bedtime. [name], [name], MD  Active Child, Pharmacy Records  senna-docusate (STIMULANT LAXATIVE) 8.6-50 MG tablet [id number]  Take 2 tablets by mouth 2 (two) times daily as needed for constipation   Active Child, Pharmacy Records  tamsulosin  (FLOMAX )  0.4 MG CAPS capsule [id number]  Take 1 capsule (0.4 mg total) by mouth daily with supper. [name], MD  Active Child, Pharmacy Records  Zinc  Oxide (TRIPLE PASTE) 12.8 % ointment [id number]  Apply topically 4 (four) times daily as needed (skin barrier). [name]., MD  Active Child, Pharmacy Records            Recommendation:   Continue Current Plan of Care  Follow Up Plan:   Telephone follow-up 2-4 weeks  Rolin Kerns, LCSW [facility]  [facility], [facility] Clinical Social Worker Direct Dial: [phone]  Fax: [phone] Website: [email or web address] 9:57 AM

## 2023-10-05 NOTE — Patient Instructions (Signed)
 Visit Information  Thank you for taking time to visit with me today. Please don't hesitate to contact me if I can be of assistance to you before our next scheduled appointment.  Your next care management appointment is by telephone on 7/29 at 1 PM  Please call the care guide team at [phone] if you need to cancel, schedule, or reschedule an appointment.   Please call the Suicide and Crisis Lifeline: 988 go to [facility] [address] [place] [phone]) call 911 if you are experiencing a Mental Health or Behavioral Health Crisis or need someone to talk to.  Rolin Kerns, LCSW [place]  [facility], [facility] Clinical Social Worker Direct Dial: [phone]  Fax: [phone] Website: [email or web address] 9:59 AM

## 2023-10-06 DIAGNOSIS — I13 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 failure and stage 1 through stage 4 chronic kidney disease, or unspecified chronic kidney disease: Secondary | ICD-10-CM | POA: Diagnosis not present

## 2023-10-06 DIAGNOSIS — J449 Chronic obstructive pulmonary disease, unspecified: Secondary | ICD-10-CM | POA: Diagnosis not present

## 2023-10-06 DIAGNOSIS — D631 Anemia in chronic kidney disease: Secondary | ICD-10-CM | POA: Diagnosis not present

## 2023-10-06 DIAGNOSIS — F0394 Unspecified dementia, unspecified severity, with anxiety: Secondary | ICD-10-CM | POA: Diagnosis not present

## 2023-10-06 DIAGNOSIS — J690 Pneumonitis due to inhalation of food and vomit: Secondary | ICD-10-CM | POA: Diagnosis not present

## 2023-10-06 DIAGNOSIS — N1832 Chronic kidney disease, stage 3b: Secondary | ICD-10-CM | POA: Diagnosis not present

## 2023-10-06 DIAGNOSIS — J9601 Acute respiratory failure with hypoxia: Secondary | ICD-10-CM | POA: Diagnosis not present

## 2023-10-06 DIAGNOSIS — F0393 Unspecified dementia, unspecified severity, with mood disturbance: Secondary | ICD-10-CM | POA: Diagnosis not present

## 2023-10-06 DIAGNOSIS — I5033 Acute on chronic diastolic (congestive) heart failure: Secondary | ICD-10-CM | POA: Diagnosis not present

## 2023-10-09 DIAGNOSIS — J9601 Acute respiratory failure with hypoxia: Secondary | ICD-10-CM | POA: Diagnosis not present

## 2023-10-09 DIAGNOSIS — I5033 Acute on chronic diastolic (congestive) heart failure: Secondary | ICD-10-CM | POA: Diagnosis not present

## 2023-10-09 DIAGNOSIS — I13 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 failure and stage 1 through stage 4 chronic kidney disease, or unspecified chronic kidney disease: Secondary | ICD-10-CM | POA: Diagnosis not present

## 2023-10-09 DIAGNOSIS — F0394 Unspecified dementia, unspecified severity, with anxiety: Secondary | ICD-10-CM | POA: Diagnosis not present

## 2023-10-09 DIAGNOSIS — F0393 Unspecified dementia, unspecified severity, with mood disturbance: Secondary | ICD-10-CM | POA: Diagnosis not present

## 2023-10-09 DIAGNOSIS — J449 Chronic obstructive pulmonary disease, unspecified: Secondary | ICD-10-CM | POA: Diagnosis not present

## 2023-10-09 DIAGNOSIS — D631 Anemia in chronic kidney disease: Secondary | ICD-10-CM | POA: Diagnosis not present

## 2023-10-09 DIAGNOSIS — J690 Pneumonitis due to inhalation of food and vomit: Secondary | ICD-10-CM | POA: Diagnosis not present

## 2023-10-09 DIAGNOSIS — N1832 Chronic kidney disease, stage 3b: Secondary | ICD-10-CM | POA: Diagnosis not present

## 2023-10-10 ENCOUNTER — Other Ambulatory Visit (HOSPITAL_COMMUNITY): Payer: Self-pay

## 2023-10-10 ENCOUNTER — Ambulatory Visit: Admitting: Family Medicine

## 2023-10-10 ENCOUNTER — Encounter: Payer: Self-pay | Admitting: Family Medicine

## 2023-10-10 VITALS — BP 110/62 | HR 77 | Temp 98.5°F | Ht 62.0 in

## 2023-10-10 DIAGNOSIS — E876 Hypokalemia: Secondary | ICD-10-CM | POA: Diagnosis not present

## 2023-10-10 DIAGNOSIS — I6930 Unspecified sequelae of cerebral infarction: Secondary | ICD-10-CM | POA: Diagnosis not present

## 2023-10-10 DIAGNOSIS — I639 Cerebral infarction, unspecified: Secondary | ICD-10-CM

## 2023-10-10 LAB — CBC WITH DIFFERENTIAL/PLATELET
Absolute Lymphocytes: 1232 {cells}/uL (ref 850–3900)
Absolute Monocytes: 549 {cells}/uL (ref 200–950)
Basophils Absolute: 31 {cells}/uL (ref 0–200)
Basophils Relative: 0.5 %
Eosinophils Absolute: 18 {cells}/uL (ref 15–500)
Eosinophils Relative: 0.3 %
HCT: 28.5 % — ABNORMAL LOW (ref 38.5–50.0)
Hemoglobin: 9.3 g/dL — ABNORMAL LOW (ref 13.2–17.1)
MCH: 33.3 pg — ABNORMAL HIGH (ref 27.0–33.0)
MCHC: 32.6 g/dL (ref 32.0–36.0)
MCV: 102.2 fL — ABNORMAL HIGH (ref 80.0–100.0)
MPV: 11.6 fL (ref 7.5–12.5)
Monocytes Relative: 9 %
Neutro Abs: 4270 {cells}/uL (ref 1500–7800)
Neutrophils Relative %: 70 %
Platelets: 124 Thousand/uL — ABNORMAL LOW (ref 140–400)
RBC: 2.79 Million/uL — ABNORMAL LOW (ref 4.20–5.80)
RDW: 13.7 % (ref 11.0–15.0)
Total Lymphocyte: 20.2 %
WBC: 6.1 Thousand/uL (ref 3.8–10.8)

## 2023-10-10 LAB — BASIC METABOLIC PANEL WITHOUT GFR
BUN/Creatinine Ratio: 31 (calc) — ABNORMAL HIGH (ref 6–22)
BUN: 32 mg/dL — ABNORMAL HIGH (ref 7–25)
CO2: 23 mmol/L (ref 20–32)
Calcium: 9 mg/dL (ref 8.6–10.3)
Chloride: 101 mmol/L (ref 98–110)
Creat: 1.04 mg/dL (ref 0.70–1.22)
Glucose, Bld: 133 mg/dL — ABNORMAL HIGH (ref 65–99)
Potassium: 3.3 mmol/L — ABNORMAL LOW (ref 3.5–5.3)
Sodium: 136 mmol/L (ref 135–146)

## 2023-10-10 MED ORDER — POTASSIUM CHLORIDE CRYS ER 20 MEQ PO TBCR
20.0000 meq | EXTENDED_RELEASE_TABLET | Freq: Every day | ORAL | 5 refills | Status: DC
  Filled 2023-10-10: qty 30, 30d supply, fill #0
  Filled 2023-11-20: qty 30, 30d supply, fill #1
  Filled 2023-12-20: qty 30, 30d supply, fill #2
  Filled 2024-01-17: qty 30, 30d supply, fill #3

## 2023-10-10 MED ORDER — AMOXICILLIN-POT CLAVULANATE 875-125 MG PO TABS
1.0000 | ORAL_TABLET | Freq: Two times a day (BID) | ORAL | 0 refills | Status: DC
  Filled 2023-10-10: qty 20, 10d supply, fill #0

## 2023-10-10 NOTE — Progress Notes (Addendum)
 Subjective:    Admit date: 09/22/2023 Discharge date: 09/26/2023   Admitted From: home Disposition:  home   Brief Narrative / Interim history:  89 y.o. male with medical history significant of dementia, prior history of CVA, history of lung cancer, BPH, coronary artery disease, rheumatoid arthritis, history of subdural hematoma, GERD, depression with anxiety who was brought in from home by family due to aphasia.  Around 5 PM the day of admission, all of a sudden he was unable to communicate.  Family brought him to the hospital and neurology was consulted.  An MRI of the brain showed small areas of acute ischemia within both superior frontal lobes in the right MCA and left ACA territories.   Hospital Course / Discharge diagnoses: Principal Problem:   CVA (cerebral vascular accident) (HCC) Active Problems:   Gastro-esophageal reflux disease without esophagitis   Hyperlipidemia, unspecified   S/P CABG (coronary artery bypass graft)   Permanent atrial fibrillation (HCC)   Rheumatoid arthritis, unspecified (HCC)   HFmrEF (heart failure with mildly reduced ejection fraction) (HCC)   Chronic respiratory failure, unsp w hypoxia or hypercapnia (HCC)   Essential hypertension   CKD stage 3b, GFR 30-44 ml/min (HCC) - baseline SCr 1.2-1.5   COPD (chronic obstructive pulmonary disease) (HCC)   Dementia without behavioral disturbance (HCC)     Principal problem Acute CVA -patient was admitted to the hospital with sudden onset expressive aphasia.  MRI of the brain on admission showed acute ischemia within both superior frontal lobes, on the right MCA and left ACA territories, and probable punctate focus of acute ischemia in the left occipital lobe.  Neurology consulted and followed patient while hospitalized.  He underwent stroke workup, lipid panel shows an LDL of 31.  A1c is 5.7.  A 2D echocardiogram showed LVEF 60-65%, no WMA, moderate LVH, RV systolic function was mildly reduced. therapy evaluation  recommends home health PT.  Appears to have persistent symptoms with some expressive aphasia although appears improved.  Speech evaluated, he was cleared for dysphagia 3 diet with thin liquids   Active problems Chronic atrial fibrillation-appears rate controlled, on Eliquis  2.5 mg at home. Back on Eliquis  Essential hypertension-resume home medications  Hypokalemia-replace as indicated and continue to monitor as an outpatient History of COPD-no wheezing, no exacerbation.  Continue home medication.  Family reports chronic cough.  There is a degree of chronic dyspnea, he was found to have low oxygen  saturations at time, he will be prescribed oxygen  at home Possible aspiration pneumonia-continue Augmentin  upon discharge Dementia, depression-continue fluoxetine  Hyperlipidemia-continue statin BPH-continue tamsulosin  CKD 3A-creatinine 0.9-1.2 at baseline, currently at baseline CAD, status post CABG -no chest pain, stable, continue statin Anemia, thrombocytopenia-chronic, stable, no bleeding   Sepsis ruled out    10/10/23 Patient is here today with his daughter.  He is confined to a wheelchair.  He continues to have a hard time speaking however he is at his baseline today.  His daughter states that his right sided hemiparesis is slightly worse than it was prior to this recent stroke.  Otherwise he is at his baseline.  They deny any fever.  They deny any chest pain or shortness of breath.  They deny any new cough.  He has chronic right basilar crackles and on chest CT dated June 7, the patient had opacification in the right lower lobe concerning for infection versus aspiration.  I believe he has chronic scar tissue and inflammation in that area.  He appears to be at his baseline today from a  pulmonary standpoint.  In the emergency room, the patient was found to have hypokalemia.  He is taking furosemide  Past Medical History:  Diagnosis Date   Acute blood loss anemia    Acute bronchitis 05/15/2017    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 07/14/2018   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   Altered mental status 08/21/2018   Anginal chest pain at rest [facility])    Chronic non, controlled on Lorazepam    Anxiety    Arthritis    all over    BPH (benign prostatic hyperplasia)    CAD (coronary artery disease)    Cellulitis of arm, right 06/01/2018   Cellulitis of left axilla    Chronic lower back pain    Complication of anesthesia    problems making water afterwards   Depression    DJD (degenerative joint disease)    Esophageal ulcer without bleeding    bid ppi indefinitely   Fall 07/16/2018   Family history of adverse reaction to anesthesia    children w/PONV   GERD (gastroesophageal reflux disease)    Haemophilus influenzae septicemia (HCC) 03/12/2023   Headache    couple/week maybe (09/04/2015)   Hemochromatosis    Possible, elevated iron  stores, hook-like osteophytes on hand films, normal LFTs   Hepatitis    yellow jaundice as a baby   History of ASCVD    MULTIVESSEL   Hyperlipidemia    Hypertension    Hypotension    Mild aortic stenosis 06/23/2021   Osteoarthritis    Peripheral vascular disease (HCC)    Pneumonia    several times; got a little now (09/04/2015)   Pulmonary embolism (HCC) 02/02/2018   Rash and nonspecific skin eruption 06/01/2018   Rheumatoid arthritis (HCC)    RSV (respiratory syncytial virus pneumonia) 05/15/2022   Stroke (HCC)    Subdural hematoma (HCC)    small after fall 09/2016-plavix  held, neurosurgery consulted.  Asymptomatic.     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 PO Eliquis    Past Surgical History:  Procedure Laterality Date   ARM SKIN LESION BIOPSY / EXCISION Left 09/02/2015   CATARACT EXTRACTION W/ INTRAOCULAR LENS  IMPLANT, BILATERAL Bilateral    CHOLECYSTECTOMY OPEN     CORNEAL TRANSPLANT Bilateral    one at [name]; one at  [facility]   CORONARY ANGIOPLASTY WITH STENT PLACEMENT     CORONARY ARTERY BYPASS GRAFT  1999   DESCENDING AORTIC ANEURYSM REPAIR W/ STENT     DE stent ostium into the right radial free graft at [facility]-, 02-2007   ESOPHAGOGASTRODUODENOSCOPY N/A 11/09/2014   Procedure: ESOPHAGOGASTRODUODENOSCOPY (EGD);  Surgeon: Norleen Hint, MD;  Location: [facility] ENDOSCOPY;  Service: Endoscopy;  Laterality: N/A;   INGUINAL HERNIA REPAIR     JOINT REPLACEMENT     LEFT HEART CATH AND CORS/GRAFTS ANGIOGRAPHY N/A 06/27/2017   Procedure: LEFT HEART CATH AND CORS/GRAFTS ANGIOGRAPHY;  Surgeon: Burnard Debby LABOR, MD;  Location: [facility] INVASIVE CV LAB;  Service: Cardiovascular;  Laterality: N/A;   NASAL SINUS SURGERY     SHOULDER OPEN ROTATOR CUFF REPAIR Right    TOTAL KNEE ARTHROPLASTY Bilateral    Current Outpatient Medications on File Prior to Visit  Medication Sig Dispense Refill   acetaminophen  (TYLENOL ) 325 MG tablet Take 325-650 mg by mouth 2 (two) times daily as needed for mild pain (pain score 1-3), moderate pain (pain score 4-6), fever or headache.     apixaban  (ELIQUIS ) 2.5 MG TABS tablet  Take 1 tablet (2.5 mg total) by mouth 2 (two) times daily. 180 tablet 3   budesonide -formoterol  (SYMBICORT ) 160-4.5 MCG/ACT inhaler Inhale 2 puffs into the lungs in the morning and at bedtime. 10.2 g 12   ferrous sulfate  (FEROSUL) 325 (65 FE) MG tablet TAKE 1 TABLET EVERY DAY 90 tablet 0   FLUoxetine  (PROZAC ) 20 MG capsule TAKE 1 CAPSULE EVERY DAY 90 capsule 3   folic acid  (FOLVITE ) 1 MG tablet Take 1 tablet (1 mg total) by mouth daily. 90 tablet 1   furosemide  (LASIX ) 40 MG tablet TAKE 1 TABLET EVERY DAY 90 tablet 3   hydrOXYzine  (ATARAX ) 10 MG/5ML syrup Take 5 mLs (10 mg total) by mouth every 8 (eight) hours as needed for itching. 30 mL 0   melatonin 3 MG TABS tablet Take 1 tablet (3 mg total) by mouth at bedtime as needed (insomnia). 90 tablet 1   metoprolol  tartrate (LOPRESSOR ) 25 MG tablet Take 25 mg by mouth daily.     Multiple  Vitamins-Minerals (MENS 50+ MULTIVITAMIN) TABS Take 1 tablet by mouth daily.     mupirocin  ointment (BACTROBAN ) 2 % Apply 1 Application topically 2 (two) times daily. 22 g 0   nitroGLYCERIN  (NITROSTAT ) 0.4 MG SL tablet Place 1 tablet (0.4 mg total) under the tongue every 5 (five) minutes as needed for chest pain. 50 tablet 3   nystatin  (MYCOSTATIN /NYSTOP ) powder Apply a thin layer topically to the affected area 2-3 (two to three) times daily. 15 g 0   pantoprazole  (PROTONIX ) 40 MG tablet TAKE 1 TABLET EVERY MORNING 90 tablet 3   rosuvastatin  (CRESTOR ) 5 MG tablet Take 1 tablet (5 mg total) by mouth at bedtime.     senna-docusate (STIMULANT LAXATIVE) 8.6-50 MG tablet Take 2 tablets by mouth 2 (two) times daily as needed for constipation 60 tablet 2   tamsulosin  (FLOMAX ) 0.4 MG CAPS capsule Take 1 capsule (0.4 mg total) by mouth daily with supper. 90 capsule 3   Zinc  Oxide (TRIPLE PASTE) 12.8 % ointment Apply topically 4 (four) times daily as needed (skin barrier). 56.7 g 0   No current facility-administered medications on file prior to visit.   Allergies  Allergen Reactions   Feldene [Piroxicam] Rash    Blistering rash     Neomycin -Polymyxin-Hc Itching   Statins Other (See Comments)    Myalgias   Latex Rash    Band Aid   Tequin [Gatifloxacin] Swelling and Anxiety   Valium [Diazepam] Other (See Comments)    Dizziness    Vibramycin  [Doxycycline ] Itching and Nausea Only   Social History   Socioeconomic History   Marital status: Widowed    Spouse name: Not on file   Number of children: 2   Years of education: 26   Highest education level: 12th grade  Occupational History   Not on file  Tobacco Use   Smoking status: Former    Types: Cigarettes    Passive exposure: Yes   Smokeless tobacco: Former    Types: Chew   Tobacco comments:    quit chewing in the 1960s    Verified by Daughter, [name]  Vaping Use   Vaping status: Never Used  Substance and Sexual Activity    Alcohol  use: No   Drug use: No   Sexual activity: Never  Other Topics Concern   Not on file  Social History Narrative   Not on file   Social Drivers of Health   Financial Resource Strain: Low Risk  (04/20/2023)  Overall Financial Resource Strain (CARDIA)    Difficulty of Paying Living Expenses: Not hard at all  Food Insecurity: No Food Insecurity (09/27/2023)   Hunger Vital Sign    Worried About Running Out of Food in the Last Year: Never true    Ran Out of Food in the Last Year: Never true  Transportation Needs: No Transportation Needs (09/27/2023)   PRAPARE - Administrator, Civil Service (Medical): No    Lack of Transportation (Non-Medical): No  Physical Activity: Insufficiently Active (04/20/2023)   Exercise Vital Sign    Days of Exercise per Week: 5 days    Minutes of Exercise per Session: 10 min  Stress: No Stress Concern Present (04/20/2023)   [employer] of Occupational Health - Occupational Stress Questionnaire    Feeling of Stress : Not at all  Social Connections: Socially Isolated (09/23/2023)   Social Connection and Isolation Panel    Frequency of Communication with Friends and Family: Never    Frequency of Social Gatherings with Friends and Family: Twice a week    Attends Religious Services: Never    Database administrator or Organizations: No    Attends Banker Meetings: Never    Marital Status: Widowed  Intimate Partner Violence: Patient Unable To Answer (09/27/2023)   Humiliation, Afraid, Rape, and Kick questionnaire    Fear of Current or Ex-Partner: Patient unable to answer    Emotionally Abused: Patient unable to answer    Physically Abused: Patient unable to answer    Sexually Abused: Patient unable to answer      Review of Systems     Objective:   Physical Exam Vitals reviewed.  Constitutional:      General: He is not in acute distress.    Appearance: Normal appearance. He is not toxic-appearing or diaphoretic.   Cardiovascular:     Rate and Rhythm: Normal rate and regular rhythm.     Pulses:          Dorsalis pedis pulses are 1+ on the right side and 1+ on the left side.     Heart sounds: Normal heart sounds. No murmur heard.    No friction rub. No gallop.  Pulmonary:     Effort: Pulmonary effort is normal. No respiratory distress.     Breath sounds: Examination of the right-middle field reveals rhonchi and rales. Examination of the right-lower field reveals rhonchi and rales. Rhonchi and rales present. No wheezing.    Musculoskeletal:     Right lower leg: No edema.     Left lower leg: No edema.  Neurological:     Mental Status: He is alert.   Right-sided hemiparesis with weakness in his right leg.  Weakness with knee flexion and extension and ankle flexion and extension.  Ataxia in the right upper arm and right lower leg.       Assessment & Plan:  Hypokalemia - Plan: CBC with Differential/Platelet, Basic Metabolic Panel Without GFR  Cerebrovascular accident (CVA), unspecified mechanism (HCC) I will repeat his potassium level.  Begin K-Dur 20 milliequivalents daily given his hypokalemia secondary to furosemide .  I explained that if he does not take the furosemide  he should not take the K-dur.  Patient's stroke occurred secondary to atrial fibrillation.  He is already on 2.5 mg of Eliquis  twice daily.  Given his weight and age this is the appropriate dose.  We discussed the risk versus increasing to 5 mg twice daily and I believe that the risk  outweighs the benefit. Patient appears extremely weak and frail.  He has chronic opacification of right lung due to previous treated lung cancer as well as scarring from radiation therapy.  I believe the patient has poor pulmonary toilet due to weakness and insufficient effort.   Due to his previous history of stroke, the patient is completely immobile and confined to a bed or wheelchair.  He is incontinent of urine.  He has altered sensory perception in  his trunk and in his lower abdomen and lower extremities.  He has a pressure ulcer on his gluteus.  It measures 1 x 0.5 x 0.01 cm, Stage 2.  He also has impaired nutritional status.  For all these reasons he needs a gel overlay mattress on his bed.

## 2023-10-11 ENCOUNTER — Other Ambulatory Visit: Payer: Self-pay | Admitting: *Deleted

## 2023-10-11 DIAGNOSIS — J690 Pneumonitis due to inhalation of food and vomit: Secondary | ICD-10-CM | POA: Diagnosis not present

## 2023-10-11 DIAGNOSIS — D631 Anemia in chronic kidney disease: Secondary | ICD-10-CM | POA: Diagnosis not present

## 2023-10-11 DIAGNOSIS — I5033 Acute on chronic diastolic (congestive) heart failure: Secondary | ICD-10-CM | POA: Diagnosis not present

## 2023-10-11 DIAGNOSIS — N1832 Chronic kidney disease, stage 3b: Secondary | ICD-10-CM | POA: Diagnosis not present

## 2023-10-11 DIAGNOSIS — J9601 Acute respiratory failure with hypoxia: Secondary | ICD-10-CM | POA: Diagnosis not present

## 2023-10-11 DIAGNOSIS — F0394 Unspecified dementia, unspecified severity, with anxiety: Secondary | ICD-10-CM | POA: Diagnosis not present

## 2023-10-11 DIAGNOSIS — J449 Chronic obstructive pulmonary disease, unspecified: Secondary | ICD-10-CM | POA: Diagnosis not present

## 2023-10-11 DIAGNOSIS — I13 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 failure and stage 1 through stage 4 chronic kidney disease, or unspecified chronic kidney disease: Secondary | ICD-10-CM | POA: Diagnosis not present

## 2023-10-11 DIAGNOSIS — F0393 Unspecified dementia, unspecified severity, with mood disturbance: Secondary | ICD-10-CM | POA: Diagnosis not present

## 2023-10-11 NOTE — Patient Instructions (Signed)
 Visit Information  Thank you for taking time to visit with me today. Please don't hesitate to contact me if I can be of assistance to you before our next scheduled telephone appointment.  Our next appointment is by telephone on 10/19/23 at 2 pm  Following is a copy of your care plan:   Goals Addressed             This Visit's Progress    V[facility]I Transitions of Care (TOC) Care Plan       Problems:  Recent Hospitalization for treatment of Acute CVA No Hospital Follow Up Provider appointment patient's daughter declines for RN CM to schedule appointment, states she will make the appointment to accommodate her schedule and when pt is better able to leave home and No Specialist appointment daughter verbalizes understanding of and has contact # for Stroke Clinic and will call and schedule appointment to be seen within 4-6 weeks 10/04/23-spoke with daughter, overall pt doing well, saw podiatrist on 6/30 and this wore him out, difficult to get pt out of home,  Home health PT, RN continues working with pt, pt has redness to bottom area (lower cheek areas), no open areas, MD to assess on 10/10/23 10/11/23- pt saw primary care provider 10/10/23, no new concerns reported, home health continues working with pt, pt has appointment with stroke clinic on 12/06/23  Goal:  Over the next 30 days, the patient will not experience hospital readmission  Interventions:  Stroke: Reviewed Importance of taking all medications as prescribed Reviewed Importance of attending all scheduled provider appointments Advised patient to discuss any change in heath status/ symptoms with provide Assessed for signs and symptoms of stroke Reinforced signs/ symptoms of stroke Reviewed safety precautions Reinforced signs/ symptoms of infection Reviewed upcoming scheduled appointments  Reinforced importance of working with home health RN/ PT Reviewed dysphagia 3 diet, aspiration precautions Reviewed with daughter to assist pt to change  positions q 2 hours and adequate protein in diet to avoid skin break down Reviewed oxygen  safety Reinforced care of foley catheter, signs/ symptoms of infection  Patient Self Care Activities:  Attend all scheduled provider appointments Call pharmacy for medication refills 3-7 days in advance of running out of medications Call provider office for new concerns or questions  Notify RN Care Manager of TOC call rescheduling needs Participate in Transition of Care Program/Attend TOC scheduled calls Take medications as prescribed   Work with the social worker to address care coordination needs and will continue to work with the clinical team to address health care and disease management related needs Change positions every 2 hours Include adequate protein with each meal  Plan:  Telephone follow up appointment with care management team member scheduled for:  10/19/23 @ 2 pm The patient has been provided with contact information for the care management team and has been advised to call with any health related questions or concerns.         Patient verbalizes understanding of instructions and care plan provided today and agrees to view in MyChart. Active MyChart status and patient understanding of how to access instructions and care plan via MyChart confirmed with patient.     Telephone follow up appointment with care management team member scheduled for: 10/19/23 @ 2 pm  Please call the care guide team at [phone] if you need to cancel or reschedule your appointment.   Please call the Suicide and Crisis Lifeline: 988 call the [place]  National Suicide Prevention Lifeline: 325-020-7320 or TTY: (939)601-0939 TTY (252)339-7734)  to talk to a trained counselor call 1-800-273-TALK (toll free, 24 hour hotline) go to [facility] [address] [place] [phone]) call 911 if you are experiencing a Mental Health or Behavioral Health Crisis or need someone to  talk to.  Mliss Creed [facility], BSN RN Care Manager/ Transition of Care [place]/ [facility] [phone]

## 2023-10-11 NOTE — Patient Outreach (Signed)
 Transition of Care week 3  Visit Note  10/11/2023  Name: Evan [name] MRN: [id number]          DOB: 25-Jan-1928  Situation: Patient enrolled in [facility] 30-day program. Visit completed with patient by telephone.   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   Altered mental status 08/21/2018   Anginal chest pain at rest [facility])    Chronic non, controlled on Lorazepam    Anxiety    Arthritis    all over    BPH (benign prostatic hyperplasia)    CAD (coronary artery disease)    Cellulitis of arm, right 06/01/2018   Cellulitis of left axilla    Chronic lower back pain    Complication of anesthesia    problems making water afterwards   Depression    DJD (degenerative joint disease)    Esophageal ulcer without bleeding    bid ppi indefinitely   Fall 07/16/2018   Family history of adverse reaction to anesthesia    children w/PONV   GERD (gastroesophageal reflux disease)    Haemophilus influenzae septicemia (HCC) 03/12/2023   Headache    couple/week maybe (09/04/2015)   Hemochromatosis    Possible, elevated iron  stores, hook-like osteophytes on hand films, normal LFTs   Hepatitis    yellow jaundice as a baby   History of ASCVD    MULTIVESSEL   Hyperlipidemia    Hypertension    Hypotension    Mild aortic stenosis 06/23/2021   Osteoarthritis    Peripheral vascular disease (HCC)    Pneumonia    several times; got a little now (09/04/2015)   Pulmonary embolism (HCC) 02/02/2018   Rash and nonspecific skin eruption 06/01/2018   Rheumatoid arthritis (HCC)    RSV (respiratory syncytial virus pneumonia) 05/15/2022   Stroke (HCC)    Subdural hematoma (HCC)    small after fall 09/2016-plavix  held, neurosurgery consulted.  Asymptomatic.      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 PO Eliquis     Assessment: Patient Reported Symptoms: Cognitive Cognitive Status: No symptoms reported, Able to follow simple commands Cognitive/Intellectual Conditions Management [RPT]: Other Other: Dementia      Neurological Neurological Review of Symptoms: Other: (right sided weakness) Neurological Management Strategies: Adequate rest, Medication therapy, Routine screening Neurological Self-Management Outcome: 3 (uncertain) Neurological Comment: to follow up with stroke clinic on 12/06/23  Dr. Rosemarie,  continues using walker, WC  HEENT        Cardiovascular Cardiovascular Symptoms Reported: No symptoms reported Does patient have uncontrolled Hypertension?: No Cardiovascular Management Strategies: Adequate rest, Medication therapy, Routine screening Cardiovascular Self-Management Outcome: 3 (uncertain) Cardiovascular Comment: pt is unable to weigh daily  Respiratory Respiratory Symptoms Reported: No symptoms reported Respiratory Self-Management Outcome: 3 (uncertain)  Endocrine Endocrine Symptoms Reported: No symptoms reported    Gastrointestinal Gastrointestinal Symptoms Reported: No symptoms reported Gastrointestinal Management Strategies: Adequate rest, Diet modification Gastrointestinal Self-Management Outcome: 3 (uncertain) Gastrointestinal Comment: dypshagia 3 diet    Genitourinary Genitourinary Symptoms Reported: No symptoms reported Additional Genitourinary Details: foley catheter patent, draining well, no concerns reported, pt to have catheter changed at urologist 10/12/23 Genitourinary Management Strategies: Catheter, indwelling Genitourinary Self-Management Outcome: 3 (uncertain)  Integumentary Integumentary Symptoms Reported: Other Other Integumentary Symptoms: ongoing preventive measures to prevent skin breakdown to  sacral area, daughter continues applying zinc  oxide and assessment by home health RN, no  open areas, no drainage Skin Management Strategies: Routine screening, Medication therapy (zinc  oxide) Skin Self-Management Outcome: 3 (uncertain)  Musculoskeletal Musculoskelatal Symptoms Reviewed: Difficulty walking Additional Musculoskeletal Details: WC, walker, has been working with home heatlh PT Musculoskeletal Management Strategies: Routine screening, Adequate rest, Medical device Musculoskeletal Self-Management Outcome: 3 (uncertain)      Psychosocial Psychosocial Symptoms Reported: No symptoms reported         There were no vitals filed for this visit.  Medications Reviewed Today     Reviewed by Aura Mliss LABOR, RN (Registered Nurse) on 10/11/23 at 1103  Med List Status: <None>   Medication Order Taking? Sig Documenting Provider Last Dose Status Informant  acetaminophen  (TYLENOL ) 325 MG tablet [id number]  Take 325-650 mg by mouth 2 (two) times daily as needed for mild pain (pain score 1-3), moderate pain (pain score 4-6), fever or headache. [provider]  Active Child, Pharmacy Records  amoxicillin -clavulanate (AUGMENTIN ) 875-125 MG tablet [id number]  Take 1 tablet by mouth 2 (two) times daily. [name], MD  Active   apixaban  (ELIQUIS ) 2.5 MG TABS tablet [id number]  Take 1 tablet (2.5 mg total) by mouth 2 (two) times daily. Wyn Jackee VEAR Mickey., NP  Active Child, Pharmacy Records  budesonide -formoterol  (SYMBICORT ) 160-4.5 MCG/ACT inhaler [id number]  Inhale 2 puffs into the lungs in the morning and at bedtime. [name], MD  Active Child, Pharmacy Records  ferrous sulfate  [facility]) 325 (65 FE) MG tablet [id number]  TAKE 1 TABLET EVERY DAY [name], MD  Active Child, Pharmacy Records  FLUoxetine  (PROZAC ) 20 MG capsule [id number]  TAKE 1 CAPSULE EVERY DAY [name], MD  Active Child, Pharmacy Records  folic acid  (FOLVITE ) 1 MG tablet [id number]  Take 1 tablet (1 mg total) by mouth daily. [name], MD  Active   furosemide  (LASIX ) 40 MG  tablet [id number]  TAKE 1 TABLET EVERY DAY [name], MD  Active Child, Pharmacy Records  hydrOXYzine  (ATARAX ) 10 MG/5ML syrup [id number]  Take 5 mLs (10 mg total) by mouth every 8 (eight) hours as needed for itching. [name], [name], MD  Active Child, Pharmacy Records  melatonin 3 MG TABS tablet [id number]  Take 1 tablet (3 mg total) by mouth at bedtime as needed (insomnia). [name], MD  Active   metoprolol  tartrate (LOPRESSOR ) 25 MG tablet [id number]  Take 25 mg by mouth daily. [provider]  Active Child, Pharmacy Records  Multiple Vitamins-Minerals (MENS 50+ MULTIVITAMIN) TABS [id number]  Take 1 tablet by mouth daily. [provider]  Active Child, Pharmacy Records  mupirocin  ointment (BACTROBAN ) 2 % [id number]  Apply 1 Application topically 2 (two) times daily. Hyatt, [name], DPM  Active Child, Pharmacy Records  nitroGLYCERIN  (NITROSTAT ) 0.4 MG SL tablet [id number]  Place 1 tablet (0.4 mg total) under the tongue every 5 (five) minutes as needed for chest pain. [name], MD  Active Child, Pharmacy Records           Med Note [place], [name]   Sat Sep 09, 2023 10:35 PM)    nystatin  (MYCOSTATIN /NYSTOP ) powder [id number]  Apply a thin layer topically to the affected area 2-3 (two to three) times daily.   Active Child, Pharmacy Records  pantoprazole  (PROTONIX ) 40 MG tablet [id number]  TAKE 1 TABLET EVERY MORNING Pickard, Butler DASEN, MD  Active Child, Pharmacy Records  potassium chloride  SA (KLOR-CON  M) 20  MEQ tablet [id number]  Take 1 tablet (20 mEq total) by mouth daily. [name], MD  Active   rosuvastatin  (CRESTOR ) 5 MG tablet [id number]  Take 1 tablet (5 mg total) by mouth at bedtime. [name], [name], MD  Active Child, Pharmacy Records  senna-docusate (STIMULANT LAXATIVE) 8.6-50 MG tablet [id number]  Take 2 tablets by mouth 2 (two) times daily as needed for constipation   Active Child, Pharmacy Records  tamsulosin  (FLOMAX ) 0.4 MG CAPS capsule  [id number]  Take 1 capsule (0.4 mg total) by mouth daily with supper. [name], MD  Active Child, Pharmacy Records  Zinc  Oxide (TRIPLE PASTE) 12.8 % ointment [id number]  Apply topically 4 (four) times daily as needed (skin barrier). [name]., MD  Active Child, Pharmacy Records            Goals Addressed             This Visit's Progress    [facility] Transitions of Care [facility] Care Plan       Problems:  Recent Hospitalization for treatment of Acute CVA No Hospital Follow Up Provider appointment patient's daughter declines for RN CM to schedule appointment, states she will make the appointment to accommodate her schedule and when pt is better able to leave home and No Specialist appointment daughter verbalizes understanding of and has contact # for Stroke Clinic and will call and schedule appointment to be seen within 4-6 weeks 10/04/23-spoke with daughter, overall pt doing well, saw podiatrist on 6/30 and this wore him out, difficult to get pt out of home,  Home health PT, RN continues working with pt, pt has redness to bottom area (lower cheek areas), no open areas, MD to assess on 10/10/23 10/11/23- pt saw primary care provider 10/10/23, no new concerns reported, home health continues working with pt, pt has appointment with stroke clinic on 12/06/23  Goal:  Over the next 30 days, the patient will not experience hospital readmission  Interventions:  Stroke: Reviewed Importance of taking all medications as prescribed Reviewed Importance of attending all scheduled provider appointments Advised patient to discuss any change in heath status/ symptoms with provide Assessed for signs and symptoms of stroke Reinforced signs/ symptoms of stroke Reviewed safety precautions Reinforced signs/ symptoms of infection Reviewed upcoming scheduled appointments  Reinforced importance of working with home health RN/ PT Reviewed dysphagia 3 diet, aspiration precautions Reviewed with daughter  to assist pt to change positions q 2 hours and adequate protein in diet to avoid skin break down Reviewed oxygen  safety Reinforced care of foley catheter, signs/ symptoms of infection  Patient Self Care Activities:  Attend all scheduled provider appointments Call pharmacy for medication refills 3-7 days in advance of running out of medications Call provider office for new concerns or questions  Notify RN Care Manager of TOC call rescheduling needs Participate in Transition of Care Program/Attend TOC scheduled calls Take medications as prescribed   Work with the social worker to address care coordination needs and will continue to work with the clinical team to address health care and disease management related needs Change positions every 2 hours Include adequate protein with each meal  Plan:  Telephone follow up appointment with care management team member scheduled for:  10/19/23 @ 2 pm The patient has been provided with contact information for the care management team and has been advised to call with any health related questions or concerns.         Recommendation:   PCP Follow-up  Specialty provider follow-up stroke clinic 12/06/23  Follow Up Plan:   Telephone follow-up 10/19/23 @ 2 pm  Mliss Creed [facility], BSN RN Care Manager/ Transition of Care [place]/ [facility] [phone]

## 2023-10-12 ENCOUNTER — Ambulatory Visit: Payer: Self-pay | Admitting: Family Medicine

## 2023-10-12 DIAGNOSIS — R3914 Feeling of incomplete bladder emptying: Secondary | ICD-10-CM | POA: Diagnosis not present

## 2023-10-13 DIAGNOSIS — N1832 Chronic kidney disease, stage 3b: Secondary | ICD-10-CM | POA: Diagnosis not present

## 2023-10-13 DIAGNOSIS — J690 Pneumonitis due to inhalation of food and vomit: Secondary | ICD-10-CM | POA: Diagnosis not present

## 2023-10-13 DIAGNOSIS — J9601 Acute respiratory failure with hypoxia: Secondary | ICD-10-CM | POA: Diagnosis not present

## 2023-10-13 DIAGNOSIS — D631 Anemia in chronic kidney disease: Secondary | ICD-10-CM | POA: Diagnosis not present

## 2023-10-13 DIAGNOSIS — J449 Chronic obstructive pulmonary disease, unspecified: Secondary | ICD-10-CM | POA: Diagnosis not present

## 2023-10-13 DIAGNOSIS — F0393 Unspecified dementia, unspecified severity, with mood disturbance: Secondary | ICD-10-CM | POA: Diagnosis not present

## 2023-10-13 DIAGNOSIS — F0394 Unspecified dementia, unspecified severity, with anxiety: Secondary | ICD-10-CM | POA: Diagnosis not present

## 2023-10-13 DIAGNOSIS — I13 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 failure and stage 1 through stage 4 chronic kidney disease, or unspecified chronic kidney disease: Secondary | ICD-10-CM | POA: Diagnosis not present

## 2023-10-13 DIAGNOSIS — I5033 Acute on chronic diastolic (congestive) heart failure: Secondary | ICD-10-CM | POA: Diagnosis not present

## 2023-10-17 DIAGNOSIS — J9601 Acute respiratory failure with hypoxia: Secondary | ICD-10-CM | POA: Diagnosis not present

## 2023-10-17 DIAGNOSIS — N1832 Chronic kidney disease, stage 3b: Secondary | ICD-10-CM | POA: Diagnosis not present

## 2023-10-17 DIAGNOSIS — F0393 Unspecified dementia, unspecified severity, with mood disturbance: Secondary | ICD-10-CM | POA: Diagnosis not present

## 2023-10-17 DIAGNOSIS — I13 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 failure and stage 1 through stage 4 chronic kidney disease, or unspecified chronic kidney disease: Secondary | ICD-10-CM | POA: Diagnosis not present

## 2023-10-17 DIAGNOSIS — I5033 Acute on chronic diastolic (congestive) heart failure: Secondary | ICD-10-CM | POA: Diagnosis not present

## 2023-10-17 DIAGNOSIS — J449 Chronic obstructive pulmonary disease, unspecified: Secondary | ICD-10-CM | POA: Diagnosis not present

## 2023-10-17 DIAGNOSIS — J690 Pneumonitis due to inhalation of food and vomit: Secondary | ICD-10-CM | POA: Diagnosis not present

## 2023-10-17 DIAGNOSIS — D631 Anemia in chronic kidney disease: Secondary | ICD-10-CM | POA: Diagnosis not present

## 2023-10-17 DIAGNOSIS — F0394 Unspecified dementia, unspecified severity, with anxiety: Secondary | ICD-10-CM | POA: Diagnosis not present

## 2023-10-19 ENCOUNTER — Other Ambulatory Visit: Payer: Self-pay | Admitting: *Deleted

## 2023-10-19 ENCOUNTER — Encounter: Payer: Self-pay | Admitting: Family Medicine

## 2023-10-19 ENCOUNTER — Ambulatory Visit: Admitting: Family Medicine

## 2023-10-19 ENCOUNTER — Ambulatory Visit: Payer: Self-pay

## 2023-10-19 VITALS — BP 104/60 | HR 95 | Ht 62.0 in | Wt 129.0 lb

## 2023-10-19 DIAGNOSIS — J690 Pneumonitis due to inhalation of food and vomit: Secondary | ICD-10-CM | POA: Diagnosis not present

## 2023-10-19 DIAGNOSIS — M25431 Effusion, right wrist: Secondary | ICD-10-CM | POA: Diagnosis not present

## 2023-10-19 DIAGNOSIS — J449 Chronic obstructive pulmonary disease, unspecified: Secondary | ICD-10-CM | POA: Diagnosis not present

## 2023-10-19 DIAGNOSIS — N1832 Chronic kidney disease, stage 3b: Secondary | ICD-10-CM | POA: Diagnosis not present

## 2023-10-19 DIAGNOSIS — I5033 Acute on chronic diastolic (congestive) heart failure: Secondary | ICD-10-CM | POA: Diagnosis not present

## 2023-10-19 DIAGNOSIS — M25531 Pain in right wrist: Secondary | ICD-10-CM | POA: Diagnosis not present

## 2023-10-19 DIAGNOSIS — D631 Anemia in chronic kidney disease: Secondary | ICD-10-CM | POA: Diagnosis not present

## 2023-10-19 DIAGNOSIS — F0394 Unspecified dementia, unspecified severity, with anxiety: Secondary | ICD-10-CM | POA: Diagnosis not present

## 2023-10-19 DIAGNOSIS — I13 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 failure and stage 1 through stage 4 chronic kidney disease, or unspecified chronic kidney disease: Secondary | ICD-10-CM | POA: Diagnosis not present

## 2023-10-19 DIAGNOSIS — J9601 Acute respiratory failure with hypoxia: Secondary | ICD-10-CM | POA: Diagnosis not present

## 2023-10-19 DIAGNOSIS — F0393 Unspecified dementia, unspecified severity, with mood disturbance: Secondary | ICD-10-CM | POA: Diagnosis not present

## 2023-10-19 DIAGNOSIS — M7989 Other specified soft tissue disorders: Secondary | ICD-10-CM | POA: Diagnosis not present

## 2023-10-19 LAB — URIC ACID: Uric Acid, Serum: 5.9 mg/dL (ref 4.0–8.0)

## 2023-10-19 MED ORDER — PREDNISONE 20 MG PO TABS: ORAL_TABLET | ORAL | 0 refills | Status: DC

## 2023-10-19 NOTE — Assessment & Plan Note (Addendum)
 Right wrist and hand are swollen and painful to touch. There is warmth, no redness. Lung sounds are clear and there are no signs of volume overload. DVT ruled out previously. Has history of gout relieved with prednisone . PCP has discussed allopurinol with family for gout prevention. Will obtain Uric acid level and treat with Pred taper. If symptoms unrelieved or worsen seek medical care. If relieved encouraged to speak with PCP and consider Allopurinol. Follow up PRN

## 2023-10-19 NOTE — Patient Outreach (Signed)
 Transition of Care week 4  Visit Note  10/19/2023  Name: Evan [name] MRN: [id number]          DOB: 11/12/1927  Situation: Patient enrolled in [facility] 30-day program. Visit completed with patient's daughter by telephone.   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   Altered mental status 08/21/2018   Anginal chest pain at rest [facility])    Chronic non, controlled on Lorazepam    Anxiety    Arthritis    all over    BPH (benign prostatic hyperplasia)    CAD (coronary artery disease)    Cellulitis of arm, right 06/01/2018   Cellulitis of left axilla    Chronic lower back pain    Complication of anesthesia    problems making water afterwards   Depression    DJD (degenerative joint disease)    Esophageal ulcer without bleeding    bid ppi indefinitely   Fall 07/16/2018   Family history of adverse reaction to anesthesia    children w/PONV   GERD (gastroesophageal reflux disease)    Haemophilus influenzae septicemia (HCC) 03/12/2023   Headache    couple/week maybe (09/04/2015)   Hemochromatosis    Possible, elevated iron  stores, hook-like osteophytes on hand films, normal LFTs   Hepatitis    yellow jaundice as a baby   History of ASCVD    MULTIVESSEL   Hyperlipidemia    Hypertension    Hypotension    Mild aortic stenosis 06/23/2021   Osteoarthritis    Peripheral vascular disease (HCC)    Pneumonia    several times; got a little now (09/04/2015)   Pulmonary embolism (HCC) 02/02/2018   Rash and nonspecific skin eruption 06/01/2018   Rheumatoid arthritis (HCC)    RSV (respiratory syncytial virus pneumonia) 05/15/2022   Stroke (HCC)    Subdural hematoma (HCC)    small after fall 09/2016-plavix  held, neurosurgery consulted.  Asymptomatic.      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 PO Eliquis     Assessment: Patient Reported Symptoms: Cognitive Cognitive Status: No symptoms reported, Able to follow simple commands, Alert and oriented to person, place, and time      Neurological Neurological Review of Symptoms: Weakness [place] Neurological Symptoms/Conditions [RPT]: right sided weakness Neurological Management Strategies: Adequate rest, Medication therapy, Routine screening Neurological Self-Management Outcome: 4 (good)  HEENT HEENT Symptoms Reported: No symptoms reported      Cardiovascular Cardiovascular Symptoms Reported: No symptoms reported Does patient have uncontrolled Hypertension?: No Cardiovascular Management Strategies: Adequate rest, Medication therapy, Routine screening Cardiovascular Self-Management Outcome: 3 (uncertain) Cardiovascular Comment: pt is unable to weigh daily, reviewed HF action plan  Respiratory Respiratory Symptoms Reported: Shortness of breath Other Respiratory Symptoms: dyspnea with exertion only    Endocrine Endocrine Symptoms Reported: No symptoms reported Is patient diabetic?: No    Gastrointestinal Gastrointestinal Symptoms Reported: Other (dysphagia) Other Gastrointestinal Symptoms: dysphagia, improvied per daughter and  no choking  continues using thick it for liquids, aspiration precautions reviewed Gastrointestinal Management Strategies: Adequate rest, Diet modification Gastrointestinal Self-Management Outcome: 3 (uncertain) Gastrointestinal Comment: dysphagia 3 diet    Genitourinary Genitourinary Symptoms Reported: No symptoms reported Additional Genitourinary Details: foley catheter paten, draining well, changed at urologist on 10/12/23 Genitourinary Management Strategies: Catheter, indwelling Indwelling Catheter Inserted: 10/12/23  Genitourinary Self-Management Outcome: 4 (good)  Integumentary Integumentary Symptoms Reported: Other Other  Integumentary Symptoms: saw primary care provider today for right wrist swelling, gout per daughter, now on prednisone  Additional Integumentary Details: states no drainage or open areas to sacrum but  keeping an eye on it,  home health RN discharged pt today, PT continues Skin Management Strategies: Medication therapy, Routine screening Skin Self-Management Outcome: 3 (uncertain)  Musculoskeletal Musculoskelatal Symptoms Reviewed: Difficulty walking Additional Musculoskeletal Details: WC, walker, continues working with home health PT Musculoskeletal Management Strategies: Adequate rest, Routine screening, Medical device Musculoskeletal Self-Management Outcome: 3 (uncertain) Musculoskeletal Comment: reviewed safety precautions      Psychosocial Psychosocial Symptoms Reported: No symptoms reported         There were no vitals filed for this visit.  Medications Reviewed Today     Reviewed by Aura Mliss LABOR, RN (Registered Nurse) on 10/19/23 at 1422  Med List Status: <None>   Medication Order Taking? Sig Documenting Provider Last Dose Status Informant  acetaminophen  (TYLENOL ) 325 MG tablet [id number]  Take 325-650 mg by mouth 2 (two) times daily as needed for mild pain (pain score 1-3), moderate pain (pain score 4-6), fever or headache. [provider]  Active Child, Pharmacy Records  amoxicillin -clavulanate (AUGMENTIN ) 875-125 MG tablet [id number]  Take 1 tablet by mouth 2 (two) times daily. [name], MD  Active   apixaban  (ELIQUIS ) 2.5 MG TABS tablet [id number]  Take 1 tablet (2.5 mg total) by mouth 2 (two) times daily. Wyn Jackee VEAR Mickey., NP  Active Child, Pharmacy Records  budesonide -formoterol  (SYMBICORT ) 160-4.5 MCG/ACT inhaler [id number]  Inhale 2 puffs into the lungs in the morning and at bedtime. [name], MD  Active Child, Pharmacy Records  ferrous sulfate  [facility]) 325 [phone] FE) MG tablet [id number]  TAKE 1 TABLET EVERY DAY [name], MD  Active Child,  Pharmacy Records  FLUoxetine  (PROZAC ) 20 MG capsule [id number]  TAKE 1 CAPSULE EVERY DAY [name], MD  Active Child, Pharmacy Records  folic acid  (FOLVITE ) 1 MG tablet [id number]  Take 1 tablet (1 mg total) by mouth daily. [name], MD  Active   furosemide  (LASIX ) 40 MG tablet [id number]  TAKE 1 TABLET EVERY DAY [name], MD  Active Child, Pharmacy Records  hydrOXYzine  (ATARAX ) 10 MG/5ML syrup [id number]  Take 5 mLs (10 mg total) by mouth every 8 (eight) hours as needed for itching. [name], [name], MD  Active Child, Pharmacy Records  melatonin 3 MG TABS tablet [id number]  Take 1 tablet (3 mg total) by mouth at bedtime as needed (insomnia). [name], MD  Active   metoprolol  tartrate (LOPRESSOR ) 25 MG tablet [id number]  Take 25 mg by mouth daily. [provider]  Active Child, Pharmacy Records  Multiple Vitamins-Minerals (MENS 50+ MULTIVITAMIN) TABS [id number]  Take 1 tablet by mouth daily. [provider]  Active Child, Pharmacy Records  mupirocin  ointment (BACTROBAN ) 2 % [id number]  Apply 1 Application topically 2 (two) times daily. Hyatt, [name], DPM  Active Child, Pharmacy Records  nitroGLYCERIN  (NITROSTAT ) 0.4 MG SL tablet [id number]  Place 1 tablet (0.4 mg total) under the tongue every 5 (five) minutes as needed for chest pain. [name], MD  Active Child, Pharmacy Records           Med Note Stewart, [name]   Sat Sep 09, 2023 10:35 PM)    nystatin  (MYCOSTATIN /NYSTOP ) powder [id number]  Apply a thin layer topically to the affected area 2-3 (  two to three) times daily.   Active Child, Pharmacy Records  pantoprazole  (PROTONIX ) 40 MG tablet [id number]  TAKE 1 TABLET EVERY MORNING Pickard, Butler DASEN, MD  Active Child, Pharmacy Records  potassium chloride  SA (KLOR-CON  M) 20 MEQ tablet [id number]  Take 1 tablet (20 mEq total) by mouth daily. [name], MD  Active   predniSONE  (DELTASONE ) 20 MG tablet [id number]  3 tabs poqday 1-2, 2 tabs  poqday 3-4, 1 tab poqday 5-6 Kayla Gauze S, [place]  Active   rosuvastatin  (CRESTOR ) 5 MG tablet [id number]  Take 1 tablet (5 mg total) by mouth at bedtime. [name], [name], MD  Active Child, Pharmacy Records  senna-docusate (STIMULANT LAXATIVE) 8.6-50 MG tablet [id number]  Take 2 tablets by mouth 2 (two) times daily as needed for constipation   Active Child, Pharmacy Records  tamsulosin  (FLOMAX ) 0.4 MG CAPS capsule [id number]  Take 1 capsule (0.4 mg total) by mouth daily with supper. [name], MD  Active Child, Pharmacy Records  Zinc  Oxide (TRIPLE PASTE) 12.8 % ointment [id number]  Apply topically 4 (four) times daily as needed (skin barrier). [name]., MD  Active Child, Pharmacy Records            Recommendation:   PCP Follow-up  Follow Up Plan:   Telephone follow-up 10/27/23 @ 1015 am  Mliss Creed A [place], BSN RN Care Manager/ Transition of Care [place]/ [facility] [phone]

## 2023-10-19 NOTE — Telephone Encounter (Signed)
 FYI Only or Action Required?: FYI only for provider.  Patient was last seen in primary care on 10/10/2023 by Evan Butler DASEN, MD.  Called Nurse Triage reporting Joint Swelling.  Symptoms began several weeks ago.  Interventions attempted: Rest, hydration, or home remedies.  Symptoms are: gradually worsening.  Triage Disposition: See HCP Within 4 Hours (Or PCP Triage)  Patient/caregiver understands and will follow disposition?: Yes  Copied from CRM [id number]. Topic: Clinical - Home Health Verbal Orders >> Oct 19, 2023 11:21 AM Silvana PARAS wrote: Caller/Agency: Hedda Newton  Callback Number: [phone]  Any new concerns about the patient? Yes-Right wrist is inflamed.   Warm transfer to nurse triage. Reason for Disposition  [1] Looks infected (e.g., spreading redness, pus) AND [2] large red area (> 2 inches or 5 cm)  Answer Assessment - Initial Assessment Questions 1. ONSET: When did the swelling start? (e.g., minutes, hours, days, weeks)     End of May had cellulitis d/t IV, improves and returns 2. LOCATION: What part of the wrist is swollen?  Are both wrists swollen or just one wrist?     Right, entire wrist 3. SEVERITY: How bad is the swelling?      Non-pitting 4. RECURRENT SYMPTOM: Have you had wrist swelling before? If Yes, ask: When was the last time? What happened that time?     See #1 5. CAUSE: What do you think is causing the wrist swelling? (e.g., arthritis, ganglion cyst, insect bite, recent injury)     Cellulitis vs RA 6. OTHER SYMPTOMS: Do you have any other symptoms? (e.g., fever, hand pain)     Hand is pinker and warmer than other hand  Protocols used: Wrist Swelling-A-AH

## 2023-10-19 NOTE — Progress Notes (Signed)
 Subjective:  HPI: Evan [name] is a 89 y.o. male presenting on 10/19/2023 for Acute Visit (Wrist edema wrist swollen and sore to the touch )   HPI Patient is in today with his daughter for right wrist redness and swelling for 2-3 weeks on and off. No trauma or injury. The swelling is intermittent. Has a history of recent cellulitis for which he was treated with Keflex . DVT was ruled out at that time. Cellulitis had resolved at subsequent office visit evaluations. Was also treated twice with prednisone  for gout flare and resolution of symptoms. The right wrist is painful to touch. No fever, chills, body aches, AMS. Has tried nothing.   Review of Systems  All other systems reviewed and are negative.   Relevant past medical history reviewed and updated as indicated.   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   Altered mental status 08/21/2018   Anginal chest pain at rest [facility])    Chronic non, controlled on Lorazepam    Anxiety    Arthritis    all over    BPH (benign prostatic hyperplasia)    CAD (coronary artery disease)    Cellulitis of arm, right 06/01/2018   Cellulitis of left axilla    Chronic lower back pain    Complication of anesthesia    problems making water afterwards   Depression    DJD (degenerative joint disease)    Esophageal ulcer without bleeding    bid ppi indefinitely   Fall 07/16/2018   Family history of adverse reaction to anesthesia    children w/PONV   GERD (gastroesophageal reflux disease)    Haemophilus influenzae septicemia (HCC) 03/12/2023   Headache    couple/week maybe (09/04/2015)   Hemochromatosis    Possible, elevated iron  stores, hook-like osteophytes on hand films, normal LFTs   Hepatitis    yellow jaundice as a  baby   History of ASCVD    MULTIVESSEL   Hyperlipidemia    Hypertension    Hypotension    Mild aortic stenosis 06/23/2021   Osteoarthritis    Peripheral vascular disease (HCC)    Pneumonia    several times; got a little now (09/04/2015)   Pulmonary embolism (HCC) 02/02/2018   Rash and nonspecific skin eruption 06/01/2018   Rheumatoid arthritis (HCC)    RSV (respiratory syncytial virus pneumonia) 05/15/2022   Stroke (HCC)    Subdural hematoma (HCC)    small after fall 09/2016-plavix  held, neurosurgery consulted.  Asymptomatic.     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 PO Eliquis      Past Surgical History:  Procedure Laterality Date   ARM SKIN LESION BIOPSY / EXCISION Left 09/02/2015   CATARACT EXTRACTION W/ INTRAOCULAR LENS  IMPLANT, BILATERAL Bilateral    CHOLECYSTECTOMY OPEN     CORNEAL TRANSPLANT Bilateral    one at [facility]; one at [facility]   CORONARY ANGIOPLASTY WITH STENT PLACEMENT     CORONARY ARTERY BYPASS GRAFT  1999   DESCENDING AORTIC ANEURYSM REPAIR W/ STENT     DE stent ostium into the right radial free graft at [facility]-, 02-2007   ESOPHAGOGASTRODUODENOSCOPY N/A 11/09/2014   Procedure: ESOPHAGOGASTRODUODENOSCOPY (EGD);  Surgeon: Norleen Hint, MD;  Location: [facility] ENDOSCOPY;  Service: Endoscopy;  Laterality: N/A;   INGUINAL HERNIA REPAIR  JOINT REPLACEMENT     LEFT HEART CATH AND CORS/GRAFTS ANGIOGRAPHY N/A 06/27/2017   Procedure: LEFT HEART CATH AND CORS/GRAFTS ANGIOGRAPHY;  Surgeon: Burnard Debby LABOR, MD;  Location: [facility] INVASIVE CV LAB;  Service: Cardiovascular;  Laterality: N/A;   NASAL SINUS SURGERY     SHOULDER OPEN ROTATOR CUFF REPAIR Right    TOTAL KNEE ARTHROPLASTY Bilateral     Allergies and medications reviewed and updated.   Current Outpatient Medications:    acetaminophen  (TYLENOL ) 325 MG tablet, Take 325-650 mg by mouth 2 (two) times daily as needed for mild pain (pain score 1-3), moderate pain (pain score 4-6), fever or headache.,  Disp: , Rfl:    amoxicillin -clavulanate (AUGMENTIN ) 875-125 MG tablet, Take 1 tablet by mouth 2 (two) times daily., Disp: 20 tablet, Rfl: 0   apixaban  (ELIQUIS ) 2.5 MG TABS tablet, Take 1 tablet (2.5 mg total) by mouth 2 (two) times daily., Disp: 180 tablet, Rfl: 3   budesonide -formoterol  (SYMBICORT ) 160-4.5 MCG/ACT inhaler, Inhale 2 puffs into the lungs in the morning and at bedtime., Disp: 10.2 g, Rfl: 12   ferrous sulfate  (FEROSUL) 325 (65 FE) MG tablet, TAKE 1 TABLET EVERY DAY, Disp: 90 tablet, Rfl: 0   FLUoxetine  (PROZAC ) 20 MG capsule, TAKE 1 CAPSULE EVERY DAY, Disp: 90 capsule, Rfl: 3   folic acid  (FOLVITE ) 1 MG tablet, Take 1 tablet (1 mg total) by mouth daily., Disp: 90 tablet, Rfl: 1   furosemide  (LASIX ) 40 MG tablet, TAKE 1 TABLET EVERY DAY, Disp: 90 tablet, Rfl: 3   hydrOXYzine  (ATARAX ) 10 MG/5ML syrup, Take 5 mLs (10 mg total) by mouth every 8 (eight) hours as needed for itching., Disp: 30 mL, Rfl: 0   melatonin 3 MG TABS tablet, Take 1 tablet (3 mg total) by mouth at bedtime as needed (insomnia)., Disp: 90 tablet, Rfl: 1   metoprolol  tartrate (LOPRESSOR ) 25 MG tablet, Take 25 mg by mouth daily., Disp: , Rfl:    Multiple Vitamins-Minerals (MENS 50+ MULTIVITAMIN) TABS, Take 1 tablet by mouth daily., Disp: , Rfl:    mupirocin  ointment (BACTROBAN ) 2 %, Apply 1 Application topically 2 (two) times daily., Disp: 22 g, Rfl: 0   nitroGLYCERIN  (NITROSTAT ) 0.4 MG SL tablet, Place 1 tablet (0.4 mg total) under the tongue every 5 (five) minutes as needed for chest pain., Disp: 50 tablet, Rfl: 3   nystatin  (MYCOSTATIN /NYSTOP ) powder, Apply a thin layer topically to the affected area 2-3 (two to three) times daily., Disp: 15 g, Rfl: 0   pantoprazole  (PROTONIX ) 40 MG tablet, TAKE 1 TABLET EVERY MORNING, Disp: 90 tablet, Rfl: 3   potassium chloride  SA (KLOR-CON  M) 20 MEQ tablet, Take 1 tablet (20 mEq total) by mouth daily., Disp: 30 tablet, Rfl: 5   predniSONE  (DELTASONE ) 20 MG tablet, 3 tabs poqday  1-2, 2 tabs poqday 3-4, 1 tab poqday 5-6, Disp: 12 tablet, Rfl: 0   rosuvastatin  (CRESTOR ) 5 MG tablet, Take 1 tablet (5 mg total) by mouth at bedtime., Disp: , Rfl:    senna-docusate (STIMULANT LAXATIVE) 8.6-50 MG tablet, Take 2 tablets by mouth 2 (two) times daily as needed for constipation, Disp: 60 tablet, Rfl: 2   tamsulosin  (FLOMAX ) 0.4 MG CAPS capsule, Take 1 capsule (0.4 mg total) by mouth daily with supper., Disp: 90 capsule, Rfl: 3   Zinc  Oxide (TRIPLE PASTE) 12.8 % ointment, Apply topically 4 (four) times daily as needed (skin barrier)., Disp: 56.7 g, Rfl: 0  Allergies  Allergen Reactions   Feldene [Piroxicam] Rash  Blistering rash     Neomycin -Polymyxin-Hc Itching   Statins Other (See Comments)    Myalgias   Latex Rash    Band Aid   Tequin [Gatifloxacin] Swelling and Anxiety   Valium [Diazepam] Other (See Comments)    Dizziness    Vibramycin  [Doxycycline ] Itching and Nausea Only    Objective:   BP 104/60   Pulse 95   Ht 5' 2 (1.575 m)   Wt 128 lb 15.5 oz (58.5 kg)   SpO2 98%   BMI 23.59 kg/m      10/19/2023   12:07 PM 10/10/2023    2:57 PM 09/26/2023   11:25 AM  Vitals with BMI  Height 5' 2 5' 2   Weight 129 lbs    BMI 23.58    Systolic 104 110 899  Diastolic 60 62 56  Pulse 95 77 94     Physical Exam Vitals and nursing note reviewed.  Constitutional:      Appearance: Normal appearance. He is normal weight.  HENT:     Head: Normocephalic and atraumatic.  Pulmonary:     Effort: Pulmonary effort is normal.     Breath sounds: Normal breath sounds.  Musculoskeletal:     Right wrist: Swelling present.     Left wrist: Normal.     Right hand: Swelling present. Decreased strength. Normal capillary refill. Normal pulse.     Left hand: Normal.  Skin:    General: Skin is warm and dry.     Capillary Refill: Capillary refill takes less than 2 seconds.  Neurological:     General: No focal deficit present.     Mental Status: He is alert and oriented to  person, place, and time. Mental status is at baseline.  Psychiatric:        Mood and Affect: Mood normal.        Behavior: Behavior normal.        Thought Content: Thought content normal.        Judgment: Judgment normal.     Assessment & Plan:  Pain and swelling of right wrist Assessment & Plan: Right wrist and hand are swollen and painful to touch. There is warmth, no redness. Lung sounds are clear and there are no signs of volume overload. DVT ruled out previously. Has history of gout relieved with prednisone . PCP has discussed allopurinol with family for gout prevention. Will obtain Uric acid level and treat with Pred taper. If symptoms unrelieved or worsen seek medical care. If relieved encouraged to speak with PCP and consider Allopurinol. Follow up PRN  Orders: -     Uric acid -     predniSONE ; 3 tabs poqday 1-2, 2 tabs poqday 3-4, 1 tab poqday 5-6  Dispense: 12 tablet; Refill: 0     Follow up plan: Return if symptoms worsen or fail to improve.  Jeoffrey GORMAN Barrio, FNP

## 2023-10-19 NOTE — Patient Instructions (Signed)
 Visit Information  Thank you for taking time to visit with me today. Please don't hesitate to contact me if I can be of assistance to you before our next scheduled telephone appointment.  Our next appointment is by telephone on 10/27/23 @ 1015 am  Following is a copy of your care plan:   Goals Addressed             This Visit's Progress    [facility] Transitions of Care (TOC) Care Plan       Problems:  Recent Hospitalization for treatment of Acute CVA No Hospital Follow Up Provider appointment patient's daughter declines for RN CM to schedule appointment, states she will make the appointment to accommodate her schedule and when pt is better able to leave home and No Specialist appointment daughter verbalizes understanding of and has contact # for Stroke Clinic and will call and schedule appointment to be seen within 4-6 weeks 10/04/23-spoke with daughter, overall pt doing well, saw podiatrist on 6/30 and this wore him out, difficult to get pt out of home,  Home health PT, RN continues working with pt, pt has redness to bottom area (lower cheek areas), no open areas, MD to assess on 10/10/23 10/11/23- pt saw primary care provider 10/10/23, no new concerns reported, home health continues working with pt, pt has appointment with stroke clinic on 12/06/23 717/25- saw primary care provider today for right wrist swelling, per daughter diagnosed with gout (has had previously), on prednisone , home health PT continues working with pt, today is RN last day in the home  Goal:  Over the next 30 days, the patient will not experience hospital readmission  Interventions:  Stroke: Reviewed Importance of taking all medications as prescribed Reviewed Importance of attending all scheduled provider appointments Advised patient to discuss any change in heath status/ symptoms with provide Assessed for signs and symptoms of stroke Reviewed signs/ symptoms of stroke Reviewed safety precautions Reviewed signs/ symptoms of  infection Reviewed upcoming scheduled appointments  Reinforced importance of working with home health PT Reviewed dysphagia 3 diet, aspiration precautions Reviewed with daughter to assist pt to change positions q 2 hours and adequate protein in diet to avoid skin break down Reinforced oxygen  safety Reviewed care of foley catheter, signs/ symptoms of infection Reviewed prednisone - tapered dosage  Patient Self Care Activities:  Attend all scheduled provider appointments Call pharmacy for medication refills 3-7 days in advance of running out of medications Call provider office for new concerns or questions  Notify RN Care Manager of TOC call rescheduling needs Participate in Transition of Care Program/Attend TOC scheduled calls Take medications as prescribed   Work with the social worker to address care coordination needs and will continue to work with the clinical team to address health care and disease management related needs Change positions every 2 hours Include adequate protein with each meal Take prednisone  as prescribed  Plan:  Telephone follow up appointment with care management team member scheduled for:  10/27/23  @ 1015 am The patient has been provided with contact information for the care management team and has been advised to call with any health related questions or concerns.         Patient verbalizes understanding of instructions and care plan provided today and agrees to view in MyChart. Active MyChart status and patient understanding of how to access instructions and care plan via MyChart confirmed with patient.     Telephone follow up appointment with care management team member scheduled for: 10/27/23 @  1015 am  Please call the care guide team at [phone] if you need to cancel or reschedule your appointment.   Please call the Suicide and Crisis Lifeline: 988 call the [place]  National Suicide Prevention Lifeline: (347)033-3591 or TTY: (684) 589-6857 TTY  854-575-5506) to talk to a trained counselor call 1-800-273-TALK (toll free, 24 hour hotline) go to [facility] [address], [place] [phone]) call 911 if you are experiencing a Mental Health or Behavioral Health Crisis or need someone to talk to.  Mliss Creed [facility], BSN RN Care Manager/ Transition of Care [place]/ [facility] [phone]

## 2023-10-25 ENCOUNTER — Other Ambulatory Visit: Payer: Self-pay | Admitting: Nurse Practitioner

## 2023-10-25 DIAGNOSIS — J690 Pneumonitis due to inhalation of food and vomit: Secondary | ICD-10-CM | POA: Diagnosis not present

## 2023-10-25 DIAGNOSIS — I5022 Chronic systolic (congestive) heart failure: Secondary | ICD-10-CM | POA: Diagnosis not present

## 2023-10-25 DIAGNOSIS — I13 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 failure and stage 1 through stage 4 chronic kidney disease, or unspecified chronic kidney disease: Secondary | ICD-10-CM | POA: Diagnosis not present

## 2023-10-25 DIAGNOSIS — J9611 Chronic respiratory failure with hypoxia: Secondary | ICD-10-CM | POA: Diagnosis not present

## 2023-10-25 DIAGNOSIS — I10 Essential (primary) hypertension: Secondary | ICD-10-CM

## 2023-10-25 DIAGNOSIS — I5033 Acute on chronic diastolic (congestive) heart failure: Secondary | ICD-10-CM | POA: Diagnosis not present

## 2023-10-25 DIAGNOSIS — E785 Hyperlipidemia, unspecified: Secondary | ICD-10-CM

## 2023-10-25 DIAGNOSIS — I2510 Atherosclerotic heart disease of native coronary artery without angina pectoris: Secondary | ICD-10-CM

## 2023-10-25 DIAGNOSIS — I69151 Hemiplegia and hemiparesis following nontraumatic intracerebral hemorrhage affecting right dominant side: Secondary | ICD-10-CM | POA: Diagnosis not present

## 2023-10-25 DIAGNOSIS — I69120 Aphasia following nontraumatic intracerebral hemorrhage: Secondary | ICD-10-CM | POA: Diagnosis not present

## 2023-10-25 DIAGNOSIS — J439 Emphysema, unspecified: Secondary | ICD-10-CM | POA: Diagnosis not present

## 2023-10-25 DIAGNOSIS — I4821 Permanent atrial fibrillation: Secondary | ICD-10-CM

## 2023-10-25 DIAGNOSIS — I5020 Unspecified systolic (congestive) heart failure: Secondary | ICD-10-CM

## 2023-10-25 DIAGNOSIS — N1832 Chronic kidney disease, stage 3b: Secondary | ICD-10-CM | POA: Diagnosis not present

## 2023-10-25 NOTE — Telephone Encounter (Signed)
 Prescription refill request for Eliquis  received. Indication: Afib  Last office visit: 07/11/23 Kai)  Scr: 1.04 (10/10/23)  Age: 89 Weight: 58.5kg Appropriate dose. Refill sent.

## 2023-10-27 ENCOUNTER — Other Ambulatory Visit: Payer: Self-pay | Admitting: Family Medicine

## 2023-10-27 ENCOUNTER — Other Ambulatory Visit: Payer: Self-pay | Admitting: *Deleted

## 2023-10-27 DIAGNOSIS — I639 Cerebral infarction, unspecified: Secondary | ICD-10-CM

## 2023-10-27 DIAGNOSIS — D649 Anemia, unspecified: Secondary | ICD-10-CM

## 2023-10-27 DIAGNOSIS — R531 Weakness: Secondary | ICD-10-CM

## 2023-10-27 NOTE — Patient Outreach (Signed)
 Transition of Care week 5  Visit Note  10/27/2023  Name: ANTWONE CAPOZZOLI MRN: [id number]          DOB: 03-Nov-1927  Situation: Patient enrolled in [facility] 30-day program. Visit completed with patient by telephone.   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   Altered mental status 08/21/2018   Anginal chest pain at rest [facility])    Chronic non, controlled on Lorazepam    Anxiety    Arthritis    all over    BPH (benign prostatic hyperplasia)    CAD (coronary artery disease)    Cellulitis of arm, right 06/01/2018   Cellulitis of left axilla    Chronic lower back pain    Complication of anesthesia    problems making water afterwards   Depression    DJD (degenerative joint disease)    Esophageal ulcer without bleeding    bid ppi indefinitely   Fall 07/16/2018   Family history of adverse reaction to anesthesia    children w/PONV   GERD (gastroesophageal reflux disease)    Haemophilus influenzae septicemia (HCC) 03/12/2023   Headache    couple/week maybe (09/04/2015)   Hemochromatosis    Possible, elevated iron  stores, hook-like osteophytes on hand films, normal LFTs   Hepatitis    yellow jaundice as a baby   History of ASCVD    MULTIVESSEL   Hyperlipidemia    Hypertension    Hypotension    Mild aortic stenosis 06/23/2021   Osteoarthritis    Peripheral vascular disease (HCC)    Pneumonia    several times; got a little now (09/04/2015)   Pulmonary embolism (HCC) 02/02/2018   Rash and nonspecific skin eruption 06/01/2018   Rheumatoid arthritis (HCC)    RSV (respiratory syncytial virus pneumonia) 05/15/2022   Stroke (HCC)    Subdural hematoma (HCC)    small after fall 09/2016-plavix  held, neurosurgery consulted.  Asymptomatic.      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 PO Eliquis     Assessment: Patient Reported Symptoms: Cognitive Cognitive Status: No symptoms reported, Able to follow simple commands, Alert and oriented to person, place, and time Cognitive/Intellectual Conditions Management [RPT]: Other Other: Dementia- symptoms vary day to day      Neurological Neurological Review of Symptoms: Weakness [place] Neurological Symptoms/Conditions [RPT]: right sided weakness Neurological Management Strategies: Adequate rest, Medication therapy Neurological Self-Management Outcome: 4 (good) Neurological Comment: to follow up stroke clinic 12/06/23 Dr. Rosemarie, continues using walker, WC  HEENT HEENT Symptoms Reported: No symptoms reported HEENT Management Strategies: Routine screening HEENT Self-Management Outcome: 4 (good) HEENT Comment: HOH- bil hearing aides    Cardiovascular Cardiovascular Symptoms Reported: No symptoms reported Does patient have uncontrolled Hypertension?: No Cardiovascular Management Strategies: Adequate rest, Medication therapy, Routine screening Cardiovascular Self-Management Outcome: 3 (uncertain) Cardiovascular Comment: unable to weigh,  reinforced HF action plan  Respiratory Respiratory Symptoms Reported: Shortness of breath Other Respiratory Symptoms: dyspnea with exertion only Respiratory Management Strategies: Adequate rest, Oxygen  therapy Respiratory Self-Management Outcome: 4 (good)  Endocrine Endocrine Symptoms Reported: No symptoms reported Is patient diabetic?: No    Gastrointestinal Gastrointestinal Symptoms Reported: Other (dysphagia) Other Gastrointestinal Symptoms: dysphagia, continues thick it for liquids, aspiration precautions reinforced Gastrointestinal Management Strategies: Adequate rest, Diet modification Gastrointestinal Self-Management Outcome: 4 (good) Gastrointestinal  Comment: dysphagia 3 diet    Genitourinary Genitourinary Symptoms  Reported: No symptoms reported    Integumentary Integumentary Symptoms Reported: Other Other Integumentary Symptoms: right wrist is much better per daughter, slight edema, no redness Additional Integumentary Details: no open areas to sacrum, continues monitoring daily, trying to change positions q 2 hours Skin Management Strategies: Medication therapy, Routine screening Skin Self-Management Outcome: 3 (uncertain)  Musculoskeletal Musculoskelatal Symptoms Reviewed: Difficulty walking Additional Musculoskeletal Details: WC, walker, continues working with home health PT Musculoskeletal Management Strategies: Adequate rest, Routine screening Musculoskeletal Self-Management Outcome: 4 (good) Musculoskeletal Comment: reinforced safety precautions      Psychosocial Psychosocial Symptoms Reported: No symptoms reported Other Psychosocial Conditions: history of anxiety Behavioral Management Strategies: Coping strategies, Adequate rest Behavioral Health Self-Management Outcome: 4 (good) Behavioral Health Comment: pt continues working with Child psychotherapist- appointment on 10/31/23 @ 1 pm       There were no vitals filed for this visit.  Medications Reviewed Today     Reviewed by Aura Mliss LABOR, RN (Registered Nurse) on 10/27/23 at 1017  Med List Status: <None>   Medication Order Taking? Sig Documenting Provider Last Dose Status Informant  acetaminophen  (TYLENOL ) 325 MG tablet [id number]  Take 325-650 mg by mouth 2 (two) times daily as needed for mild pain (pain score 1-3), moderate pain (pain score 4-6), fever or headache. [provider]  Active Child, Pharmacy Records  amoxicillin -clavulanate (AUGMENTIN ) 875-125 MG tablet [id number]  Take 1 tablet by mouth 2 (two) times daily. [name], MD  Active   apixaban  (ELIQUIS ) 2.5 MG TABS tablet [id number]  TAKE 1 TABLET TWICE DAILY Varanasi, Jayadeep S, MD  Active   budesonide -formoterol  (SYMBICORT ) 160-4.5 MCG/ACT inhaler [id number]   Inhale 2 puffs into the lungs in the morning and at bedtime. [name], MD  Active Child, Pharmacy Records  FEROSUL 325 [id number] Fe) MG tablet [id number]  TAKE 1 TABLET EVERY DAY [name], MD  Active   FLUoxetine  (PROZAC ) 20 MG capsule [id number]  TAKE 1 CAPSULE EVERY DAY [name], MD  Active Child, Pharmacy Records  folic acid  (FOLVITE ) 1 MG tablet [id number]  Take 1 tablet (1 mg total) by mouth daily. [name], MD  Active   furosemide  (LASIX ) 40 MG tablet [id number]  TAKE 1 TABLET EVERY DAY [name], MD  Active Child, Pharmacy Records  hydrOXYzine  (ATARAX ) 10 MG/5ML syrup [id number]  Take 5 mLs (10 mg total) by mouth every 8 (eight) hours as needed for itching. [name], [name], MD  Active Child, Pharmacy Records  melatonin 3 MG TABS tablet [id number]  Take 1 tablet (3 mg total) by mouth at bedtime as needed (insomnia). [name], MD  Active   metoprolol  tartrate (LOPRESSOR ) 25 MG tablet [id number]  Take 25 mg by mouth daily. [provider]  Active Child, Pharmacy Records  Multiple Vitamins-Minerals (MENS 50+ MULTIVITAMIN) TABS [id number]  Take 1 tablet by mouth daily. [provider]  Active Child, Pharmacy Records  mupirocin  ointment (BACTROBAN ) 2 % [id number]  Apply 1 Application topically 2 (two) times daily. Hyatt, [name], DPM  Active Child, Pharmacy Records  nitroGLYCERIN  (NITROSTAT ) 0.4 MG SL tablet [id number]  Place 1 tablet (0.4 mg total) under the tongue every 5 (five) minutes as needed for chest pain. [name], MD  Active Child, Pharmacy Records           Med Note [place], [name]   Sat Sep 09, 2023 10:35 PM)    nystatin  (  MYCOSTATIN /NYSTOP ) powder [id number]  Apply a thin layer topically to the affected area 2-3 (two to three) times daily.   Active Child, Pharmacy Records  pantoprazole  (PROTONIX ) 40 MG tablet [id number]  TAKE 1 TABLET EVERY MORNING Pickard, Butler DASEN, MD  Active Child, Pharmacy Records  potassium chloride   SA (KLOR-CON  M) 20 MEQ tablet [id number]  Take 1 tablet (20 mEq total) by mouth daily. [name], MD  Active   predniSONE  (DELTASONE ) 20 MG tablet [id number]  3 tabs poqday 1-2, 2 tabs poqday 3-4, 1 tab poqday 5-6 Kayla Gauze S, [place]  Active   rosuvastatin  (CRESTOR ) 5 MG tablet [id number]  Take 1 tablet (5 mg total) by mouth at bedtime. [name], [name], MD  Active Child, Pharmacy Records  senna-docusate (STIMULANT LAXATIVE) 8.6-50 MG tablet [id number]  Take 2 tablets by mouth 2 (two) times daily as needed for constipation   Active Child, Pharmacy Records  tamsulosin  (FLOMAX ) 0.4 MG CAPS capsule [id number]  Take 1 capsule (0.4 mg total) by mouth daily with supper. [name], MD  Active Child, Pharmacy Records  Zinc  Oxide (TRIPLE PASTE) 12.8 % ointment [id number]  Apply topically 4 (four) times daily as needed (skin barrier). [name]., MD  Active Child, Pharmacy Records            Goals Addressed             This Visit's Progress    COMPLETED: VBCI Transitions of Care (TOC) Care Plan       Problems:  Recent Hospitalization for treatment of Acute CVA No Hospital Follow Up Provider appointment patient's daughter declines for RN CM to schedule appointment, states she will make the appointment to accommodate her schedule and when pt is better able to leave home and No Specialist appointment daughter verbalizes understanding of and has contact # for Stroke Clinic and will call and schedule appointment to be seen within 4-6 weeks 10/04/23-spoke with daughter, overall pt doing well, saw podiatrist on 6/30 and this wore him out, difficult to get pt out of home,  Home health PT, RN continues working with pt, pt has redness to bottom area (lower cheek areas), no open areas, MD to assess on 10/10/23 10/11/23- pt saw primary care provider 10/10/23, no new concerns reported, home health continues working with pt, pt has appointment with stroke clinic on 12/06/23 717/25- saw primary  care provider today for right wrist swelling, per daughter diagnosed with gout (has had previously), on prednisone , home health PT continues working with pt, today is RN last day in the home 10/27/23- per patient's daughter [name], things going pretty well  continues working with home health PT, right wrist looks much better, has slight swelling, no redness, pt has finished prednisone , no issues with foley catheter, continues dysphagia 3 diet  Goal:  Over the next 30 days, the patient will not experience hospital readmission  Interventions:  Stroke: Reviewed Importance of taking all medications as prescribed Reviewed Importance of attending all scheduled provider appointments Advised patient to discuss any change in heath status/ symptoms with provide Assessed for signs and symptoms of stroke Reinforced signs/ symptoms of stroke Reviewed safety precautions Reinforced signs/ symptoms of infection Reviewed upcoming scheduled appointments including social worker on 10/31/23 @ 1 pm Reinforced importance of working with home health PT Reviewed dysphagia 3 diet, aspiration precautions Encouraged daughter to assist pt to change positions q 2 hours and adequate protein in diet to avoid skin break down Reviewed oxygen  safety Reinforced care  of foley catheter, signs/ symptoms of infection Reviewed plan of care with patient's daughter including case closure from [facility] program, daughter agreeable to transfer to longitudinal case management for ongoing management of chronic health conditions Placed order for longitudinal case management  Patient Self Care Activities:  Attend all scheduled provider appointments Call pharmacy for medication refills 3-7 days in advance of running out of medications Call provider office for new concerns or questions  Notify RN Care Manager of TOC call rescheduling needs Participate in Transition of Care Program/Attend TOC scheduled calls Take medications as prescribed    Work with the social worker to address care coordination needs and will continue to work with the clinical team to address health care and disease management related needs Change positions every 2 hours Include adequate protein with each meal Care guide will call you to schedule telephone outreach with longitudinal RN case manager  Plan:  The patient has been provided with contact information for the care management team and has been advised to call with any health related questions or concerns.  Transferred to longitudinal case management         Recommendation:   PCP Follow-up  Follow Up Plan:   Closing From:  Transitions of Care Program  Mliss Creed [facility], BSN RN Care Manager/ Transition of Care [place]/ [facility] Population [facility] [phone]

## 2023-10-27 NOTE — Patient Instructions (Signed)
 Visit Information  Thank you for taking time to visit with me today. Please don't hesitate to contact me if I can be of assistance to you before our next scheduled telephone appointment.   Following is a copy of your care plan:   Goals Addressed             This Visit's Progress    COMPLETED: VBCI Transitions of Care (TOC) Care Plan       Problems:  Recent Hospitalization for treatment of Acute CVA No Hospital Follow Up Provider appointment patient's daughter declines for RN CM to schedule appointment, states she will make the appointment to accommodate her schedule and when pt is better able to leave home and No Specialist appointment daughter verbalizes understanding of and has contact # for Stroke Clinic and will call and schedule appointment to be seen within 4-6 weeks 10/04/23-spoke with daughter, overall pt doing well, saw podiatrist on 6/30 and this wore him out, difficult to get pt out of home,  Home health PT, RN continues working with pt, pt has redness to bottom area (lower cheek areas), no open areas, MD to assess on 10/10/23 10/11/23- pt saw primary care provider 10/10/23, no new concerns reported, home health continues working with pt, pt has appointment with stroke clinic on 12/06/23 717/25- saw primary care provider today for right wrist swelling, per daughter diagnosed with gout (has had previously), on prednisone , home health PT continues working with pt, today is RN last day in the home 10/27/23- per patient's daughter [name], things going pretty well  continues working with home health PT, right wrist looks much better, has slight swelling, no redness, pt has finished prednisone , no issues with foley catheter, continues dysphagia 3 diet  Goal:  Over the next 30 days, the patient will not experience hospital readmission  Interventions:  Stroke: Reviewed Importance of taking all medications as prescribed Reviewed Importance of attending all scheduled provider  appointments Advised patient to discuss any change in heath status/ symptoms with provide Assessed for signs and symptoms of stroke Reinforced signs/ symptoms of stroke Reviewed safety precautions Reinforced signs/ symptoms of infection Reviewed upcoming scheduled appointments including social worker on 10/31/23 @ 1 pm Reinforced importance of working with home health PT Reviewed dysphagia 3 diet, aspiration precautions Encouraged daughter to assist pt to change positions q 2 hours and adequate protein in diet to avoid skin break down Reviewed oxygen  safety Reinforced care of foley catheter, signs/ symptoms of infection Reviewed plan of care with patient's daughter including case closure from [facility] program, daughter agreeable to transfer to longitudinal case management for ongoing management of chronic health conditions Placed order for longitudinal case management  Patient Self Care Activities:  Attend all scheduled provider appointments Call pharmacy for medication refills 3-7 days in advance of running out of medications Call provider office for new concerns or questions  Notify RN Care Manager of TOC call rescheduling needs Participate in Transition of Care Program/Attend TOC scheduled calls Take medications as prescribed   Work with the social worker to address care coordination needs and will continue to work with the clinical team to address health care and disease management related needs Change positions every 2 hours Include adequate protein with each meal Care guide will call you to schedule telephone outreach with longitudinal RN case manager  Plan:  The patient has been provided with contact information for the care management team and has been advised to call with any health related questions or concerns.  Transferred to longitudinal case management        Patient verbalizes understanding of instructions and care plan provided today and agrees to view in MyChart. Active  MyChart status and patient understanding of how to access instructions and care plan via MyChart confirmed with patient.     The patient has been provided with contact information for the care management team and has been advised to call with any health related questions or concerns.   Please call the care guide team at [phone] if you need to cancel or reschedule your appointment.   Please call the Suicide and Crisis Lifeline: 988 call the [place]  National Suicide Prevention Lifeline: 386 581 5358 or TTY: 938-168-4082 TTY (806)064-5291) to talk to a trained counselor call 1-800-273-TALK (toll free, 24 hour hotline) go to [facility] [address] [place] [phone]) call 911 if you are experiencing a Mental Health or Behavioral Health Crisis or need someone to talk to.  Mliss Creed [facility], BSN RN Care Manager/ Transition of Care [place]/ [facility] [phone]

## 2023-10-31 ENCOUNTER — Telehealth: Payer: Self-pay

## 2023-10-31 ENCOUNTER — Ambulatory Visit: Payer: Self-pay

## 2023-10-31 ENCOUNTER — Other Ambulatory Visit: Payer: Self-pay | Admitting: Licensed Clinical Social Worker

## 2023-10-31 ENCOUNTER — Ambulatory Visit: Admitting: Family Medicine

## 2023-10-31 ENCOUNTER — Other Ambulatory Visit (HOSPITAL_COMMUNITY): Payer: Self-pay

## 2023-10-31 DIAGNOSIS — M25431 Effusion, right wrist: Secondary | ICD-10-CM | POA: Diagnosis not present

## 2023-10-31 DIAGNOSIS — M25531 Pain in right wrist: Secondary | ICD-10-CM | POA: Diagnosis not present

## 2023-10-31 MED ORDER — COLCHICINE 0.6 MG PO TABS: 0.6000 mg | ORAL_TABLET | Freq: Every day | ORAL | 5 refills | Status: DC

## 2023-10-31 MED ORDER — PREDNISONE 20 MG PO TABS: ORAL_TABLET | ORAL | 0 refills | Status: DC

## 2023-10-31 NOTE — Progress Notes (Signed)
 Subjective:    Patient ID: Evan [name], male    DOB: 1927-06-24, 89 y.o.   MRN: [id number]  HPI Patient is being seen today as a telephone visit.  Phone call began at 1130.  Phone call concluded at 1140.  Patient is currently at home.  His daughter is providing the history.  She consents to be seen via telephone.  I am currently in my office.  Patient has had recurrent pain in his right wrist.  We treated him for possible gout several months ago and the pain improved dramatically after 1 week of prednisone .  The patient was recently seen by my partner 2 weeks ago due to pain in his wrist.  He was started on prednisone  again and the pain went immediately away.  However 2 weeks later the pain has returned.  There is no erythema or warmth.  Patient has no fever.  The pain and swelling is limited to his right wrist.  Patient is unable to be transported today due to being wheelchair-bound and confined to home. 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   Altered mental status 08/21/2018   Anginal chest pain at rest [facility])    Chronic non, controlled on Lorazepam    Anxiety    Arthritis    all over    BPH (benign prostatic hyperplasia)    CAD (coronary artery disease)    Cellulitis of arm, right 06/01/2018   Cellulitis of left axilla    Chronic lower back pain    Complication of anesthesia    problems making water afterwards   Depression    DJD (degenerative joint disease)    Esophageal ulcer without bleeding    bid ppi indefinitely   Fall 07/16/2018   Family history of adverse reaction to anesthesia    children w/PONV   GERD (gastroesophageal reflux disease)    Haemophilus influenzae septicemia (HCC) 03/12/2023   Headache    couple/week maybe (09/04/2015)    Hemochromatosis    Possible, elevated iron  stores, hook-like osteophytes on hand films, normal LFTs   Hepatitis    yellow jaundice as a baby   History of ASCVD    MULTIVESSEL   Hyperlipidemia    Hypertension    Hypotension    Mild aortic stenosis 06/23/2021   Osteoarthritis    Peripheral vascular disease (HCC)    Pneumonia    several times; got a little now (09/04/2015)   Pulmonary embolism (HCC) 02/02/2018   Rash and nonspecific skin eruption 06/01/2018   Rheumatoid arthritis (HCC)    RSV (respiratory syncytial virus pneumonia) 05/15/2022   Stroke (HCC)    Subdural hematoma (HCC)    small after fall 09/2016-plavix  held, neurosurgery consulted.  Asymptomatic.     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 PO Eliquis    Past Surgical History:  Procedure Laterality Date   ARM SKIN LESION BIOPSY / EXCISION Left 09/02/2015   CATARACT EXTRACTION W/ INTRAOCULAR LENS  IMPLANT, BILATERAL Bilateral    CHOLECYSTECTOMY OPEN     CORNEAL TRANSPLANT Bilateral    one at [facility]; one at [facility]   CORONARY ANGIOPLASTY WITH STENT PLACEMENT     CORONARY ARTERY BYPASS GRAFT  1999   DESCENDING AORTIC ANEURYSM REPAIR W/ STENT     DE stent ostium into the  right radial free graft at [facility]-, 02-2007   ESOPHAGOGASTRODUODENOSCOPY N/A 11/09/2014   Procedure: ESOPHAGOGASTRODUODENOSCOPY (EGD);  Surgeon: Evan Hint, MD;  [facility] ENDOSCOPY;  Service: Endoscopy;  Laterality: N/A;   INGUINAL HERNIA REPAIR     JOINT REPLACEMENT     LEFT HEART CATH AND CORS/GRAFTS ANGIOGRAPHY N/A 06/27/2017   Procedure: LEFT HEART CATH AND CORS/GRAFTS ANGIOGRAPHY;  Surgeon: Burnard Debby LABOR, MD;  Location: [facility] INVASIVE CV LAB;  Service: Cardiovascular;  Laterality: N/A;   NASAL SINUS SURGERY     SHOULDER OPEN ROTATOR CUFF REPAIR Right    TOTAL KNEE ARTHROPLASTY Bilateral    Current Outpatient Medications on File Prior to Visit  Medication Sig Dispense Refill   acetaminophen  (TYLENOL ) 325 MG tablet Take  325-650 mg by mouth 2 (two) times daily as needed for mild pain (pain score 1-3), moderate pain (pain score 4-6), fever or headache.     amoxicillin -clavulanate (AUGMENTIN ) 875-125 MG tablet Take 1 tablet by mouth 2 (two) times daily. 20 tablet 0   apixaban  (ELIQUIS ) 2.5 MG TABS tablet TAKE 1 TABLET TWICE DAILY 180 tablet 1   budesonide -formoterol  (SYMBICORT ) 160-4.5 MCG/ACT inhaler Inhale 2 puffs into the lungs in the morning and at bedtime. 10.2 g 12   FEROSUL 325 (65 Fe) MG tablet TAKE 1 TABLET EVERY DAY 90 tablet 3   FLUoxetine  (PROZAC ) 20 MG capsule TAKE 1 CAPSULE EVERY DAY 90 capsule 3   folic acid  (FOLVITE ) 1 MG tablet Take 1 tablet (1 mg total) by mouth daily. 90 tablet 1   furosemide  (LASIX ) 40 MG tablet TAKE 1 TABLET EVERY DAY 90 tablet 3   hydrOXYzine  (ATARAX ) 10 MG/5ML syrup Take 5 mLs (10 mg total) by mouth every 8 (eight) hours as needed for itching. 30 mL 0   melatonin 3 MG TABS tablet Take 1 tablet (3 mg total) by mouth at bedtime as needed (insomnia). 90 tablet 1   metoprolol  tartrate (LOPRESSOR ) 25 MG tablet Take 25 mg by mouth daily.     Multiple Vitamins-Minerals (MENS 50+ MULTIVITAMIN) TABS Take 1 tablet by mouth daily.     mupirocin  ointment (BACTROBAN ) 2 % Apply 1 Application topically 2 (two) times daily. 22 g 0   nitroGLYCERIN  (NITROSTAT ) 0.4 MG SL tablet Place 1 tablet (0.4 mg total) under the tongue every 5 (five) minutes as needed for chest pain. 50 tablet 3   nystatin  (MYCOSTATIN /NYSTOP ) powder Apply a thin layer topically to the affected area 2-3 (two to three) times daily. 15 g 0   pantoprazole  (PROTONIX ) 40 MG tablet TAKE 1 TABLET EVERY MORNING 90 tablet 3   potassium chloride  SA (KLOR-CON  M) 20 MEQ tablet Take 1 tablet (20 mEq total) by mouth daily. 30 tablet 5   rosuvastatin  (CRESTOR ) 5 MG tablet Take 1 tablet (5 mg total) by mouth at bedtime.     senna-docusate (STIMULANT LAXATIVE) 8.6-50 MG tablet Take 2 tablets by mouth 2 (two) times daily as needed for  constipation 60 tablet 2   tamsulosin  (FLOMAX ) 0.4 MG CAPS capsule Take 1 capsule (0.4 mg total) by mouth daily with supper. 90 capsule 3   Zinc  Oxide (TRIPLE PASTE) 12.8 % ointment Apply topically 4 (four) times daily as needed (skin barrier). 56.7 g 0   No current facility-administered medications on file prior to visit.   Allergies  Allergen Reactions   Feldene [Piroxicam] Rash    Blistering rash     Neomycin -Polymyxin-Hc Itching   Statins Other (See Comments)    Myalgias   Latex  Rash    Band Aid   Tequin [Gatifloxacin] Swelling and Anxiety   Valium [Diazepam] Other (See Comments)    Dizziness    Vibramycin  [Doxycycline ] Itching and Nausea Only   Social History   Socioeconomic History   Marital status: Widowed    Spouse name: Not on file   Number of children: 2   Years of education: 4   Highest education level: 12th grade  Occupational History   Not on file  Tobacco Use   Smoking status: Former    Types: Cigarettes    Passive exposure: Yes   Smokeless tobacco: Former    Types: Chew   Tobacco comments:    quit chewing in the 1960s    Verified by Daughter, [name]  Vaping Use   Vaping status: Never Used  Substance and Sexual Activity   Alcohol  use: No   Drug use: No   Sexual activity: Never  Other Topics Concern   Not on file  Social History Narrative   Not on file   Social Drivers of Health   Financial Resource Strain: Low Risk  (04/20/2023)   Overall Financial Resource Strain (CARDIA)    Difficulty of Paying Living Expenses: Not hard at all  Food Insecurity: No Food Insecurity (09/27/2023)   Hunger Vital Sign    Worried About Running Out of Food in the Last Year: Never true    Ran Out of Food in the Last Year: Never true  Transportation Needs: No Transportation Needs (09/27/2023)   PRAPARE - Administrator, Civil Service (Medical): No    Lack of Transportation (Non-Medical): No  Physical Activity: Insufficiently Active  (04/20/2023)   Exercise Vital Sign    Days of Exercise per Week: 5 days    Minutes of Exercise per Session: 10 min  Stress: No Stress Concern Present (04/20/2023)   [employer] of Occupational Health - Occupational Stress Questionnaire    Feeling of Stress : Not at all  Social Connections: Socially Isolated (09/23/2023)   Social Connection and Isolation Panel    Frequency of Communication with Friends and Family: Never    Frequency of Social Gatherings with Friends and Family: Twice a week    Attends Religious Services: Never    Database administrator or Organizations: No    Attends Banker Meetings: Never    Marital Status: Widowed  Intimate Partner Violence: Patient Unable To Answer (09/27/2023)   Humiliation, Afraid, Rape, and Kick questionnaire    Fear of Current or Ex-Partner: Patient unable to answer    Emotionally Abused: Patient unable to answer    Physically Abused: Patient unable to answer    Sexually Abused: Patient unable to answer      Review of Systems  All other systems reviewed and are negative.      Objective:   Physical Exam        Assessment & Plan:  Pain and swelling of right wrist - Plan: predniSONE  (DELTASONE ) 20 MG tablet, DG Wrist Complete Right I suspect pseudogout.  Patient has a history of this recurring however the recurrences have become more frequent.  He has had pain in his great toe before.  He has had pain in his wrist before.  It usually responds rapidly to prednisone .  We will start prednisone  taper immediately.  However I have asked him to start colchicine  0.6 mg p.o. daily as a preventative thereafter.  I suspect the patient may have pseudogout given that his uric  acid level was recently around 6.  I did recommend getting an x-ray of the wrist to rule out fracture or any evidence of any bone metastasis from his previous lung cancer

## 2023-10-31 NOTE — Telephone Encounter (Signed)
 FYI Only or Action Required?: FYI only for provider. Appt for today changed to virtual. Pt and daughter may need additional help in using virtual platform.  Patient was last seen in primary care on 10/19/2023 by Kayla Jeoffrey RAMAN, FNP.  Called Nurse Triage reporting Wrist Pain. Redness pain swelling  Symptoms began unsure has seen pcp for this.  Interventions attempted: Other: seen PCP - prednisone .  Symptoms are: gradually worsening.  Triage Disposition: See PCP When Office is Open (Within 3 Days)  Patient/caregiver understands and will follow disposition?: Yes                      Copied from CRM [id number]. Topic: Clinical - Red Word Triage >> Oct 31, 2023  8:08 AM Treva T wrote: [facility] that prompted transfer to Nurse Triage: Patient daughter calling, Michaelle (DPR verified), reports that patient is experiencing extreme pain in right hand/wrist area, and increased swelling. Reason for Disposition  [1] MODERATE pain (e.g., interferes with normal activities) AND [2] present > 3 days  Answer Assessment - Initial Assessment Questions 1. ONSET: When did the pain start?     2 months 2. LOCATION: Where is the pain located?     Right wrist and hand 3. PAIN: How bad is the pain? (Scale 1-10; or mild, moderate, severe)   - MILD (1-3): doesn't interfere with normal activities   - MODERATE (4-7): interferes with normal activities (e.g., work or school) or awakens from sleep   - SEVERE (8-10): excruciating pain, unable to use hand at all     severe 5. CAUSE: What do you think is causing the pain?     Has see PCP for this - not improving 7. OTHER SYMPTOMS: Do you have any other symptoms? (e.g., neck pain, swelling, rash, numbness, fever)     Pain, redness, swelling  Protocols used: Hand and Wrist Pain-A-AH

## 2023-10-31 NOTE — Telephone Encounter (Signed)
 Staff message from [place]:  Hey MJ. His daughter is holding. She called and had the triage nurse change his visit to a video visit because he's not traveling well. She's unable to access his MyChart account (issue since she created her own).   Since he's already been seen twice about the issue, she's wondering if this can't just be a telephone call; stated she thought the next step would be long-term gout medication. Said she never received a call back about the uric acid blood test...thinks it's gout.   Does he need to be seen? Are you okay with sending in gout medication for him? Thank you.

## 2023-11-01 ENCOUNTER — Other Ambulatory Visit (HOSPITAL_COMMUNITY): Payer: Self-pay

## 2023-11-01 NOTE — Patient Outreach (Signed)
 Complex Care Management   Visit Note  10/31/2023  Name:  Evan [name] MRN: [id number] DOB: 19-Oct-1927  Situation: Referral received for Complex Care Management related to Mental/Behavioral Health diagnosis GAD I obtained verbal consent from Caregiver.  Visit completed with pt's daughter Evan [name]  on the phone  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   Altered mental status 08/21/2018   Anginal chest pain at rest [facility])    Chronic non, controlled on Lorazepam    Anxiety    Arthritis    all over    BPH (benign prostatic hyperplasia)    CAD (coronary artery disease)    Cellulitis of arm, right 06/01/2018   Cellulitis of left axilla    Chronic lower back pain    Complication of anesthesia    problems making water afterwards   Depression    DJD (degenerative joint disease)    Esophageal ulcer without bleeding    bid ppi indefinitely   Fall 07/16/2018   Family history of adverse reaction to anesthesia    children w/PONV   GERD (gastroesophageal reflux disease)    Haemophilus influenzae septicemia (HCC) 03/12/2023   Headache    couple/week maybe (09/04/2015)   Hemochromatosis    Possible, elevated iron  stores, hook-like osteophytes on hand films, normal LFTs   Hepatitis    yellow jaundice as a baby   History of ASCVD    MULTIVESSEL   Hyperlipidemia    Hypertension    Hypotension    Mild aortic stenosis 06/23/2021   Osteoarthritis    Peripheral vascular disease (HCC)    Pneumonia    several times; got a little now (09/04/2015)   Pulmonary embolism (HCC) 02/02/2018   Rash and nonspecific skin eruption 06/01/2018   Rheumatoid arthritis (HCC)    RSV (respiratory syncytial virus pneumonia) 05/15/2022   Stroke (HCC)    Subdural  hematoma (HCC)    small after fall 09/2016-plavix  held, neurosurgery consulted.  Asymptomatic.     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 PO Eliquis     Assessment: Patient Reported Symptoms:  Cognitive Cognitive Status: No symptoms reported Cognitive/Intellectual Conditions Management [RPT]: Other Other: Dementia      Neurological Neurological Review of Symptoms: Not assessed    HEENT HEENT Symptoms Reported: Not assessed      Cardiovascular Cardiovascular Symptoms Reported: Not assessed    Respiratory Respiratory Symptoms Reported: Not assesed    Endocrine Endocrine Symptoms Reported: Not assessed    Gastrointestinal Gastrointestinal Symptoms Reported: Not assessed      Genitourinary Genitourinary Symptoms Reported: Not assessed    Integumentary Integumentary Symptoms Reported: Skin changes Other Integumentary Symptoms: Caregiver reports pt's backside is raw however denies it being a wound/rash Skin Management Strategies: Routine screening  Musculoskeletal Musculoskelatal Symptoms Reviewed: Difficulty walking Additional Musculoskeletal Details: Pt continues to participate in [facility] PT Musculoskeletal Management Strategies: Routine screening, Adequate rest      Psychosocial Psychosocial Symptoms Reported: Other Other Psychosocial Conditions: stress managing health Behavioral Management Strategies: Coping strategies, Support system Major Change/Loss/Stressor/Fears (CP): Medical condition, self Quality of Family Relationships: helpful, involved, supportive      07/10/2023   12:11 PM  Depression screen PHQ 2/9  Decreased Interest 3  Down, Depressed, Hopeless 3  PHQ - 2 Score 6  Altered sleeping 0  Tired, decreased energy 2  Change in appetite 0  Feeling bad or failure about yourself  1  Trouble concentrating 1  Moving slowly or fidgety/restless 1  Suicidal thoughts 1  PHQ-9 Score 12    There were no vitals filed for this  visit.  Medications Reviewed Today     Reviewed by Ezzard Rolin BIRCH, LCSW (Social Worker) on 11/01/23 at 1625  Med List Status: <None>   Medication Order Taking? Sig Documenting Provider Last Dose Status Informant  acetaminophen  (TYLENOL ) 325 MG tablet [id number]  Take 325-650 mg by mouth 2 (two) times daily as needed for mild pain (pain score 1-3), moderate pain (pain score 4-6), fever or headache. [provider]  Active Child, Pharmacy Records  amoxicillin -clavulanate (AUGMENTIN ) 875-125 MG tablet [id number]  Take 1 tablet by mouth 2 (two) times daily. [name], MD  Active   apixaban  (ELIQUIS ) 2.5 MG TABS tablet [id number]  TAKE 1 TABLET TWICE DAILY Varanasi, Jayadeep S, MD  Active   budesonide -formoterol  (SYMBICORT ) 160-4.5 MCG/ACT inhaler [id number]  Inhale 2 puffs into the lungs in the morning and at bedtime. [name], MD  Active Child, Pharmacy Records  colchicine  0.6 MG tablet [id number]  Take 1 tablet (0.6 mg total) by mouth daily. [name], MD  Active   FEROSUL 325 [phone]) MG tablet [id number]  TAKE 1 TABLET EVERY DAY [name], MD  Active   FLUoxetine  (PROZAC ) 20 MG capsule [id number]  TAKE 1 CAPSULE EVERY DAY [name], MD  Active Child, Pharmacy Records  folic acid  (FOLVITE ) 1 MG tablet [id number]  Take 1 tablet (1 mg total) by mouth daily. [name], MD  Active   furosemide  (LASIX ) 40 MG tablet [id number]  TAKE 1 TABLET EVERY DAY [name], MD  Active Child, Pharmacy Records  hydrOXYzine  (ATARAX ) 10 MG/5ML syrup [id number]  Take 5 mLs (10 mg total) by mouth every 8 (eight) hours as needed for itching. [name], [name], MD  Active Child, Pharmacy Records  melatonin 3 MG TABS tablet [id number]  Take 1 tablet (3 mg total) by mouth at bedtime as needed (insomnia). [name], MD  Active   metoprolol  tartrate (LOPRESSOR ) 25 MG tablet [id number]  Take 25 mg by mouth daily. [provider]  Active Child, Pharmacy  Records  Multiple Vitamins-Minerals (MENS 50+ MULTIVITAMIN) TABS [id number]  Take 1 tablet by mouth daily. [provider]  Active Child, Pharmacy Records  mupirocin  ointment (BACTROBAN ) 2 % [id number]  Apply 1 Application topically 2 (two) times daily. Hyatt, [name], DPM  Active Child, Pharmacy Records  nitroGLYCERIN  (NITROSTAT ) 0.4 MG SL tablet [id number]  Place 1 tablet (0.4 mg total) under the tongue every 5 (five) minutes as needed for chest pain. [name], MD  Active Child, Pharmacy Records           Med Note Wheaton, [name]   Sat Sep 09, 2023 10:35 PM)    nystatin  (MYCOSTATIN /NYSTOP ) powder [id number]  Apply a thin layer topically to the affected area 2-3 (two to three) times daily.   Active Child, Pharmacy Records  pantoprazole  (PROTONIX ) 40 MG tablet [id number]  TAKE 1 TABLET EVERY MORNING Pickard, Butler DASEN, MD  Active Child, Pharmacy Records  potassium chloride  SA (KLOR-CON  M) 20 MEQ tablet [id number]  Take 1 tablet (20 mEq total) by mouth daily. [name], MD  Active   predniSONE  (DELTASONE ) 20 MG tablet [id number]  3 tabs poqday 1-2, 2  tabs poqday 3-4, 1 tab poqday 5-6 [name], MD  Active   rosuvastatin  (CRESTOR ) 5 MG tablet [id number]  Take 1 tablet (5 mg total) by mouth at bedtime. [name], [name], MD  Active Child, Pharmacy Records  senna-docusate (STIMULANT LAXATIVE) 8.6-50 MG tablet [id number]  Take 2 tablets by mouth 2 (two) times daily as needed for constipation   Active Child, Pharmacy Records  tamsulosin  (FLOMAX ) 0.4 MG CAPS capsule [id number]  Take 1 capsule (0.4 mg total) by mouth daily with supper. [name], MD  Active Child, Pharmacy Records  Zinc  Oxide (TRIPLE PASTE) 12.8 % ointment [id number]  Apply topically 4 (four) times daily as needed (skin barrier). [name]., MD  Active Child, Pharmacy Records            Recommendation:   Continue Current Plan of Care  Follow Up Plan:   Telephone follow-up in 1  month  Rolin Kerns, LCSW [facility]  [facility], [facility] Clinical Social Worker Direct Dial: [phone]  Fax: [phone] Website: [email or web address] 4:35 PM

## 2023-11-01 NOTE — Patient Instructions (Signed)
 Visit Information  Thank you for taking time to visit with me today. Please don't hesitate to contact me if I can be of assistance to you before our next scheduled appointment.  Your next care management appointment is by telephone on 08/26 at 11 AM  Please call the care guide team at [phone] if you need to cancel, schedule, or reschedule an appointment.   Please call the Suicide and Crisis Lifeline: 988 go to [facility] Urgent [facility] [address], [place] [phone]) call 911 if you are experiencing a Mental Health or Behavioral Health Crisis or need someone to talk to.  Rolin Kerns, LCSW [place]  [facility], [facility] Clinical Social Worker Direct Dial: [phone]  Fax: [phone] Website: [email or web address] 4:36 PM

## 2023-11-02 DIAGNOSIS — I5022 Chronic systolic (congestive) heart failure: Secondary | ICD-10-CM | POA: Diagnosis not present

## 2023-11-02 DIAGNOSIS — I13 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 failure and stage 1 through stage 4 chronic kidney disease, or unspecified chronic kidney disease: Secondary | ICD-10-CM | POA: Diagnosis not present

## 2023-11-02 DIAGNOSIS — I5033 Acute on chronic diastolic (congestive) heart failure: Secondary | ICD-10-CM | POA: Diagnosis not present

## 2023-11-02 DIAGNOSIS — J439 Emphysema, unspecified: Secondary | ICD-10-CM | POA: Diagnosis not present

## 2023-11-02 DIAGNOSIS — N1832 Chronic kidney disease, stage 3b: Secondary | ICD-10-CM | POA: Diagnosis not present

## 2023-11-02 DIAGNOSIS — F0393 Unspecified dementia, unspecified severity, with mood disturbance: Secondary | ICD-10-CM

## 2023-11-02 DIAGNOSIS — J9611 Chronic respiratory failure with hypoxia: Secondary | ICD-10-CM | POA: Diagnosis not present

## 2023-11-02 DIAGNOSIS — D631 Anemia in chronic kidney disease: Secondary | ICD-10-CM

## 2023-11-02 DIAGNOSIS — J690 Pneumonitis due to inhalation of food and vomit: Secondary | ICD-10-CM | POA: Diagnosis not present

## 2023-11-02 DIAGNOSIS — I69120 Aphasia following nontraumatic intracerebral hemorrhage: Secondary | ICD-10-CM | POA: Diagnosis not present

## 2023-11-02 DIAGNOSIS — I69151 Hemiplegia and hemiparesis following nontraumatic intracerebral hemorrhage affecting right dominant side: Secondary | ICD-10-CM | POA: Diagnosis not present

## 2023-11-02 DIAGNOSIS — F0394 Unspecified dementia, unspecified severity, with anxiety: Secondary | ICD-10-CM

## 2023-11-03 DIAGNOSIS — I5033 Acute on chronic diastolic (congestive) heart failure: Secondary | ICD-10-CM | POA: Diagnosis not present

## 2023-11-03 DIAGNOSIS — I69151 Hemiplegia and hemiparesis following nontraumatic intracerebral hemorrhage affecting right dominant side: Secondary | ICD-10-CM | POA: Diagnosis not present

## 2023-11-03 DIAGNOSIS — I69120 Aphasia following nontraumatic intracerebral hemorrhage: Secondary | ICD-10-CM | POA: Diagnosis not present

## 2023-11-03 DIAGNOSIS — J9611 Chronic respiratory failure with hypoxia: Secondary | ICD-10-CM | POA: Diagnosis not present

## 2023-11-03 DIAGNOSIS — I13 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 failure and stage 1 through stage 4 chronic kidney disease, or unspecified chronic kidney disease: Secondary | ICD-10-CM | POA: Diagnosis not present

## 2023-11-03 DIAGNOSIS — J439 Emphysema, unspecified: Secondary | ICD-10-CM | POA: Diagnosis not present

## 2023-11-03 DIAGNOSIS — J690 Pneumonitis due to inhalation of food and vomit: Secondary | ICD-10-CM | POA: Diagnosis not present

## 2023-11-03 DIAGNOSIS — I5022 Chronic systolic (congestive) heart failure: Secondary | ICD-10-CM | POA: Diagnosis not present

## 2023-11-03 DIAGNOSIS — N1832 Chronic kidney disease, stage 3b: Secondary | ICD-10-CM | POA: Diagnosis not present

## 2023-11-07 DIAGNOSIS — I69120 Aphasia following nontraumatic intracerebral hemorrhage: Secondary | ICD-10-CM | POA: Diagnosis not present

## 2023-11-07 DIAGNOSIS — I5022 Chronic systolic (congestive) heart failure: Secondary | ICD-10-CM | POA: Diagnosis not present

## 2023-11-07 DIAGNOSIS — N1832 Chronic kidney disease, stage 3b: Secondary | ICD-10-CM | POA: Diagnosis not present

## 2023-11-07 DIAGNOSIS — J690 Pneumonitis due to inhalation of food and vomit: Secondary | ICD-10-CM | POA: Diagnosis not present

## 2023-11-07 DIAGNOSIS — J9611 Chronic respiratory failure with hypoxia: Secondary | ICD-10-CM | POA: Diagnosis not present

## 2023-11-07 DIAGNOSIS — I5033 Acute on chronic diastolic (congestive) heart failure: Secondary | ICD-10-CM | POA: Diagnosis not present

## 2023-11-07 DIAGNOSIS — I69151 Hemiplegia and hemiparesis following nontraumatic intracerebral hemorrhage affecting right dominant side: Secondary | ICD-10-CM | POA: Diagnosis not present

## 2023-11-07 DIAGNOSIS — J439 Emphysema, unspecified: Secondary | ICD-10-CM | POA: Diagnosis not present

## 2023-11-07 DIAGNOSIS — I13 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 failure and stage 1 through stage 4 chronic kidney disease, or unspecified chronic kidney disease: Secondary | ICD-10-CM | POA: Diagnosis not present

## 2023-11-10 ENCOUNTER — Encounter (HOSPITAL_COMMUNITY): Payer: Self-pay | Admitting: *Deleted

## 2023-11-10 ENCOUNTER — Emergency Department (HOSPITAL_COMMUNITY)
Admission: EM | Admit: 2023-11-10 | Discharge: 2023-11-11 | Disposition: A | Discharge status: Home / Self Care | Attending: Emergency Medicine | Admitting: Emergency Medicine

## 2023-11-10 ENCOUNTER — Emergency Department (HOSPITAL_COMMUNITY)

## 2023-11-10 ENCOUNTER — Other Ambulatory Visit: Payer: Self-pay

## 2023-11-10 DIAGNOSIS — Z79899 Other long term (current) drug therapy: Secondary | ICD-10-CM | POA: Insufficient documentation

## 2023-11-10 DIAGNOSIS — Z7901 Long term (current) use of anticoagulants: Secondary | ICD-10-CM | POA: Insufficient documentation

## 2023-11-10 DIAGNOSIS — J449 Chronic obstructive pulmonary disease, unspecified: Secondary | ICD-10-CM | POA: Diagnosis not present

## 2023-11-10 DIAGNOSIS — R509 Fever, unspecified: Secondary | ICD-10-CM | POA: Diagnosis not present

## 2023-11-10 DIAGNOSIS — I5022 Chronic systolic (congestive) heart failure: Secondary | ICD-10-CM | POA: Diagnosis not present

## 2023-11-10 DIAGNOSIS — I5033 Acute on chronic diastolic (congestive) heart failure: Secondary | ICD-10-CM | POA: Diagnosis not present

## 2023-11-10 DIAGNOSIS — Z91040 Latex allergy status: Secondary | ICD-10-CM | POA: Insufficient documentation

## 2023-11-10 DIAGNOSIS — J90 Pleural effusion, not elsewhere classified: Secondary | ICD-10-CM | POA: Diagnosis not present

## 2023-11-10 DIAGNOSIS — I69151 Hemiplegia and hemiparesis following nontraumatic intracerebral hemorrhage affecting right dominant side: Secondary | ICD-10-CM | POA: Diagnosis not present

## 2023-11-10 DIAGNOSIS — N189 Chronic kidney disease, unspecified: Secondary | ICD-10-CM | POA: Insufficient documentation

## 2023-11-10 DIAGNOSIS — I509 Heart failure, unspecified: Secondary | ICD-10-CM | POA: Diagnosis not present

## 2023-11-10 DIAGNOSIS — I10 Essential (primary) hypertension: Secondary | ICD-10-CM | POA: Diagnosis not present

## 2023-11-10 DIAGNOSIS — J9611 Chronic respiratory failure with hypoxia: Secondary | ICD-10-CM | POA: Diagnosis not present

## 2023-11-10 DIAGNOSIS — N3001 Acute cystitis with hematuria: Secondary | ICD-10-CM | POA: Insufficient documentation

## 2023-11-10 DIAGNOSIS — F0390 Unspecified dementia without behavioral disturbance: Secondary | ICD-10-CM | POA: Diagnosis not present

## 2023-11-10 DIAGNOSIS — I13 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 failure and stage 1 through stage 4 chronic kidney disease, or unspecified chronic kidney disease: Secondary | ICD-10-CM | POA: Diagnosis not present

## 2023-11-10 DIAGNOSIS — N1832 Chronic kidney disease, stage 3b: Secondary | ICD-10-CM | POA: Diagnosis not present

## 2023-11-10 DIAGNOSIS — I700 Atherosclerosis of aorta: Secondary | ICD-10-CM | POA: Diagnosis not present

## 2023-11-10 DIAGNOSIS — J439 Emphysema, unspecified: Secondary | ICD-10-CM | POA: Diagnosis not present

## 2023-11-10 DIAGNOSIS — I2510 Atherosclerotic heart disease of native coronary artery without angina pectoris: Secondary | ICD-10-CM | POA: Diagnosis not present

## 2023-11-10 DIAGNOSIS — J690 Pneumonitis due to inhalation of food and vomit: Secondary | ICD-10-CM | POA: Diagnosis not present

## 2023-11-10 DIAGNOSIS — I69120 Aphasia following nontraumatic intracerebral hemorrhage: Secondary | ICD-10-CM | POA: Diagnosis not present

## 2023-11-10 DIAGNOSIS — Z8673 Personal history of transient ischemic attack (TIA), and cerebral infarction without residual deficits: Secondary | ICD-10-CM | POA: Diagnosis not present

## 2023-11-10 DIAGNOSIS — R059 Cough, unspecified: Secondary | ICD-10-CM | POA: Diagnosis not present

## 2023-11-10 LAB — COMPREHENSIVE METABOLIC PANEL WITH GFR
ALT: 15 U/L (ref 0–44)
AST: 14 U/L — ABNORMAL LOW (ref 15–41)
Albumin: 2.9 g/dL — ABNORMAL LOW (ref 3.5–5.0)
Alkaline Phosphatase: 92 U/L (ref 38–126)
Anion gap: 10 (ref 5–15)
BUN: 32 mg/dL — ABNORMAL HIGH (ref 8–23)
CO2: 20 mmol/L — ABNORMAL LOW (ref 22–32)
Calcium: 8.6 mg/dL — ABNORMAL LOW (ref 8.9–10.3)
Chloride: 104 mmol/L (ref 98–111)
Creatinine, Ser: 1.2 mg/dL (ref 0.61–1.24)
GFR, Estimated: 56 mL/min — ABNORMAL LOW (ref >60)
Glucose, Bld: 113 mg/dL — ABNORMAL HIGH (ref 70–99)
Potassium: 4.7 mmol/L (ref 3.5–5.1)
Sodium: 134 mmol/L — ABNORMAL LOW (ref 135–145)
Total Bilirubin: 0.9 mg/dL (ref 0.0–1.2)
Total Protein: 6.5 g/dL (ref 6.5–8.1)

## 2023-11-10 LAB — CBC
HCT: 31 % — ABNORMAL LOW (ref 39.0–52.0)
Hemoglobin: 10.1 g/dL — ABNORMAL LOW (ref 13.0–17.0)
MCH: 33.9 pg (ref 26.0–34.0)
MCHC: 32.6 g/dL (ref 30.0–36.0)
MCV: 104 fL — ABNORMAL HIGH (ref 80.0–100.0)
Platelets: 71 K/uL — ABNORMAL LOW (ref 150–400)
RBC: 2.98 MIL/uL — ABNORMAL LOW (ref 4.22–5.81)
RDW: 15.9 % — ABNORMAL HIGH (ref 11.5–15.5)
WBC: 9.8 K/uL (ref 4.0–10.5)
nRBC: 0 % (ref 0.0–0.2)

## 2023-11-10 LAB — I-STAT CG4 LACTIC ACID, ED: Lactic Acid, Venous: 1.1 mmol/L (ref 0.5–1.9)

## 2023-11-10 LAB — LIPASE, BLOOD: Lipase: 40 U/L (ref 11–51)

## 2023-11-10 NOTE — ED Triage Notes (Signed)
 The pt will only say that his butt hurts  the pts family member reports that he has had a fever today  but they gave him tylenol   and his fever has gone down  the time was 1800  the family member with him reports that he has areas that sound like skin break downs

## 2023-11-10 NOTE — ED Provider Notes (Signed)
 [place] EMERGENCY DEPARTMENT AT [facility] Provider Note   CSN: [id number] Arrival date & time: 11/10/23  2019     Patient presents with: Fever   Evan [name] is a 89 y.o. male.   The history is provided by the patient and medical records.  Fever  89 year old male with history of anemia, CHF, coronary artery disease, CKD, depression, COPD, dementia, hypertension, right hemiparesis from prior stroke, presenting to the ED with family for fever.  Additional history provided by family at bedside.  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  Family has been managing this at home for quite some time.  Today around 6 PM developed a fever, resolved with 650 mg of Tylenol .  They did give him a dose of Augmentin  that they had at home as they were instructed to do by primary care doctor but encouraged ER evaluation if fever develops.  He has been having a cough for the past 2 weeks, occasionally producing some mucous but not much.  No hemoptysis.  No vomiting or diarrhea.  Has been eating fairly well per family.  Prior to Admission medications   Medication Sig Start Date End Date Taking? Authorizing Provider  cephALEXin  (KEFLEX ) 500 MG capsule Take 1 capsule (500 mg total) by mouth 2 (two) times daily. 11/11/23  Yes Jarold Olam HERO, PA-C  acetaminophen  (TYLENOL ) 325 MG tablet Take 325-650 mg by mouth 2 (two) times daily as needed for mild pain (pain score 1-3), moderate pain (pain score 4-6), fever or headache.    [provider]  amoxicillin -clavulanate (AUGMENTIN ) 875-125 MG tablet Take 1 tablet by mouth 2 (two) times daily. 10/10/23   [name], MD  apixaban  (ELIQUIS ) 2.5 MG TABS tablet TAKE 1 TABLET TWICE DAILY 10/25/23   Varanasi, Jayadeep S, MD  budesonide -formoterol  (SYMBICORT ) 160-4.5 MCG/ACT inhaler Inhale 2 puffs into the lungs in the morning and at bedtime. 06/08/23   [name], MD  colchicine  0.6 MG tablet Take 1 tablet (0.6 mg total) by  mouth daily. 10/31/23   [name], MD  FEROSUL 325 (65 Fe) MG tablet TAKE 1 TABLET EVERY DAY 10/27/23   [name], MD  FLUoxetine  (PROZAC ) 20 MG capsule TAKE 1 CAPSULE EVERY DAY 03/20/23   [name], MD  folic acid  (FOLVITE ) 1 MG tablet Take 1 tablet (1 mg total) by mouth daily. 10/03/23   [name], MD  furosemide  (LASIX ) 40 MG tablet TAKE 1 TABLET EVERY DAY 07/10/23   [name], MD  hydrOXYzine  (ATARAX ) 10 MG/5ML syrup Take 5 mLs (10 mg total) by mouth every 8 (eight) hours as needed for itching. 08/21/23   [name], Mauricio Daniel, MD  melatonin 3 MG TABS tablet Take 1 tablet (3 mg total) by mouth at bedtime as needed (insomnia). 10/03/23   [name], MD  metoprolol  tartrate (LOPRESSOR ) 25 MG tablet Take 25 mg by mouth daily.    [provider]  Multiple Vitamins-Minerals (MENS 50+ MULTIVITAMIN) TABS Take 1 tablet by mouth daily.    [provider]  mupirocin  ointment (BACTROBAN ) 2 % Apply 1 Application topically 2 (two) times daily. 07/24/23   Hyatt, [name], DPM  nitroGLYCERIN  (NITROSTAT ) 0.4 MG SL tablet Place 1 tablet (0.4 mg total) under the tongue every 5 (five) minutes as needed for chest pain. 05/16/23   [name], MD  nystatin  (MYCOSTATIN /NYSTOP ) powder Apply a thin layer topically to the affected area 2-3 (two to  three) times daily. 08/03/23     pantoprazole  (PROTONIX ) 40 MG tablet TAKE 1 TABLET EVERY MORNING 09/18/23   [name], MD  potassium chloride  SA (KLOR-CON  M) 20 MEQ tablet Take 1 tablet (20 mEq total) by mouth daily. 10/10/23   [name], MD  predniSONE  (DELTASONE ) 20 MG tablet 3 tabs poqday 1-2, 2 tabs poqday 3-4, 1 tab poqday 5-6 10/31/23   [name], MD  rosuvastatin  (CRESTOR ) 5 MG tablet Take 1 tablet (5 mg total) by mouth at bedtime. 08/21/23   [name], Mauricio Daniel, MD  senna-docusate (STIMULANT LAXATIVE) 8.6-50 MG tablet Take 2 tablets by mouth 2 (two) times daily as needed for constipation  07/21/22     tamsulosin  (FLOMAX ) 0.4 MG CAPS capsule Take 1 capsule (0.4 mg total) by mouth daily with supper. 01/26/23   [name], MD  Zinc  Oxide (TRIPLE PASTE) 12.8 % ointment Apply topically 4 (four) times daily as needed (skin barrier). 05/03/22   [name]., MD    Allergies: Feldene [piroxicam], Neomycin -polymyxin-hc, Statins, Latex, Tequin [gatifloxacin], Valium [diazepam], and Vibramycin  [doxycycline ]    Review of Systems  Constitutional:  Positive for fever.  All other systems reviewed and are negative.   Updated Vital Signs BP 111/63   Pulse 71   Temp 97.9 F (36.6 C) (Oral)   Resp (!) 28   Ht 5' 2 (1.575 m)   Wt 58.5 kg   SpO2 97%   BMI 23.59 kg/m   Physical Exam Vitals and nursing note reviewed.  Constitutional:      Appearance: He is well-developed.     Comments: Elderly, very hard of hearing  HENT:     Head: Normocephalic and atraumatic.  Eyes:     Conjunctiva/sclera: Conjunctivae normal.     Pupils: Pupils are equal, round, and reactive to light.  Cardiovascular:     Rate and Rhythm: Normal rate and regular rhythm.     Heart sounds: Normal heart sounds.  Pulmonary:     Effort: Pulmonary effort is normal. No respiratory distress.     Breath sounds: Normal breath sounds. No rhonchi.     Comments: Wet, rattly cough during exam, non productive, no distress, sats 97% during exam Abdominal:     General: Bowel sounds are normal.     Palpations: Abdomen is soft.  Genitourinary:    Comments: Exam chaperoned by NT Decubitus ulcer present to sacral area, there is some skin breakdown without signs of infection, no drainage or foul odor present Musculoskeletal:        General: Normal range of motion.     Cervical back: Normal range of motion.  Skin:    General: Skin is warm and dry.     Comments: Very minor areas of skin breakdown to the right thoracic back, clean without drainage or surrounding induration  Neurological:     Mental Status: He  is alert and oriented to person, place, and time.     (all labs ordered are listed, but only abnormal results are displayed) Labs Reviewed  COMPREHENSIVE METABOLIC PANEL WITH GFR - Abnormal; Notable for the following components:      Result Value   Sodium 134 (*)    CO2 20 (*)    Glucose, Bld 113 (*)    BUN 32 (*)    Calcium  8.6 (*)    Albumin 2.9 (*)    AST 14 (*)    GFR, Estimated 56 (*)    All other components within  normal limits  CBC - Abnormal; Notable for the following components:   RBC 2.98 (*)    Hemoglobin 10.1 (*)    HCT 31.0 (*)    MCV 104.0 (*)    RDW 15.9 (*)    Platelets 71 (*)    All other components within normal limits  URINALYSIS, W/ REFLEX TO CULTURE (INFECTION SUSPECTED) - Abnormal; Notable for the following components:   APPearance CLOUDY (*)    Hgb urine dipstick MODERATE (*)    Leukocytes,Ua LARGE (*)    Bacteria, UA RARE (*)    All other components within normal limits  RESP PANEL BY RT-PCR (RSV, FLU A&B, COVID)  [id number]  CULTURE, BLOOD (ROUTINE X 2)  CULTURE, BLOOD (ROUTINE X 2)  URINE CULTURE  LIPASE, BLOOD  I-STAT CG4 LACTIC ACID, ED    EKG: EKG Interpretation Date/Time:  Friday November 10 2023 23:49:37 EDT Ventricular Rate:  73 PR Interval:    QRS Duration:  104 QT Interval:  392 QTC Calculation: 432 R Axis:   -11  Text Interpretation: Atrial fibrillation Probable anteroseptal infarct, old Abnormal T, consider ischemia, lateral leads Confirmed by Jerral Meth [phone]) on 11/11/2023 12:24:08 AM  Radiology: DG Chest 2 View Result Date: 11/10/2023 CLINICAL DATA:  Cough and fever EXAM: CHEST - 2 VIEW COMPARISON:  09/23/2023 FINDINGS: Cardiac shadow is prominent but stable. Postsurgical changes are seen. Aortic calcifications are noted. Central vascular prominence is noted with diffuse increased interstitial markings stable from the prior exam. No focal confluent infiltrate is seen. Small right pleural effusion is noted. IMPRESSION: New  small right effusion. Stable chronic interstitial changes Electronically Signed   By: Oneil Devonshire M.D.   On: 11/10/2023 23:34     Procedures   Medications Ordered in the ED  cefTRIAXone  (ROCEPHIN ) 1 g in sodium chloride  0.9 % 100 mL IVPB (0 g Intravenous Stopped 11/11/23 0240)    Clinical Course as of 11/11/23 0317  Sat Nov 11, 2023  1855 89 year old male with fever.  Likely UTI.  Fresh Foley placed, shared medical decision making between admission and discharge for UTI. [CC]  0155 Discussed with family.  Vantin.  Rocephin  and family wants outpatient care.  [CC]    Clinical Course User Index [CC] Jerral Meth, MD                                 Medical Decision Making Amount and/or Complexity of Data Reviewed Labs: ordered. Radiology: ordered and independent interpretation performed. ECG/medicine tests: ordered and independent interpretation performed.  Risk Prescription drug management.   89 year old male presenting to the ED with family for fever.  Resolved after dose of Tylenol .  Has had a little bit of a cough.  Also has indwelling Foley, due to be changed next week per family.  He is afebrile here and nontoxic in appearance.  He does have a wet, rattly cough but no production of mucus.  Saturations are stable on room air.  Does have known decubitus ulcer to his sacral area, this appears well cared for by family, no foul odor or signs of superimposed infection on exam.  Also has some very small secondary areas on his right back, also without infection.    Basic labs obtained from triage are reviewed, no leukocytosis or significant electrolyte derangement noted.  Will expand work-up to include chest x-ray, RVP, UA, lactate, and cultures.  Normal lactate.  Chest x-ray with small right-sided effusion.  Continue saturating well without distress.  Foley was exchanged, new UA sent off of this and appears infectious with bacteria and >50 WBC's present.  Culture pending.  Blood  cultures sent.  RVP negative.  Dose of IV rocephin  given here.  Results discussed with family, they voiced understanding.  Admission was offered but they declined and would prefer for him to have antibiotics at home.  Encouraged to follow-up closely with PCP.  Return here for any new or acute changes.  Final diagnoses:  Acute cystitis with hematuria    ED Discharge Orders          Ordered    cephALEXin  (KEFLEX ) 500 MG capsule  2 times daily        11/11/23 0216               Jarold Olam HERO, PA-C 11/11/23 9677    Jerral Meth, MD 11/11/23 [phone]

## 2023-11-11 LAB — RESP PANEL BY RT-PCR (RSV, FLU A&B, COVID)  RVPGX2
Influenza A by PCR: NEGATIVE
Influenza B by PCR: NEGATIVE
Resp Syncytial Virus by PCR: NEGATIVE
SARS Coronavirus 2 by RT PCR: NEGATIVE

## 2023-11-11 LAB — URINALYSIS, W/ REFLEX TO CULTURE (INFECTION SUSPECTED)
Bilirubin Urine: NEGATIVE
Glucose, UA: NEGATIVE mg/dL
Ketones, ur: NEGATIVE mg/dL
Nitrite: NEGATIVE
Protein, ur: NEGATIVE mg/dL
Specific Gravity, Urine: 1.012 (ref 1.005–1.030)
WBC, UA: >50 WBC/hpf (ref 0–5)
pH: 6 (ref 5.0–8.0)

## 2023-11-11 MED ORDER — CEPHALEXIN 500 MG PO CAPS: 500.0000 mg | ORAL_CAPSULE | Freq: Two times a day (BID) | ORAL | 0 refills | Status: DC

## 2023-11-11 MED ORDER — SODIUM CHLORIDE 0.9 % IV SOLN
1.0000 g | Freq: Once | INTRAVENOUS | Status: AC
  Administered 2023-11-11: 1 g via INTRAVENOUS
  Filled 2023-11-11: qty 10

## 2023-11-11 NOTE — Discharge Instructions (Signed)
 Found to have urine infection today on testing, suspect this is etiology of fever. Keep an eye on this, can continue tylenol  as needed for fever. I would stop the augmentin , start the keflex . Follow-up with your primary care doctor. Return here for new concerns.

## 2023-11-14 DIAGNOSIS — I5022 Chronic systolic (congestive) heart failure: Secondary | ICD-10-CM | POA: Diagnosis not present

## 2023-11-14 DIAGNOSIS — N1832 Chronic kidney disease, stage 3b: Secondary | ICD-10-CM | POA: Diagnosis not present

## 2023-11-14 DIAGNOSIS — J690 Pneumonitis due to inhalation of food and vomit: Secondary | ICD-10-CM | POA: Diagnosis not present

## 2023-11-14 DIAGNOSIS — J9611 Chronic respiratory failure with hypoxia: Secondary | ICD-10-CM | POA: Diagnosis not present

## 2023-11-14 DIAGNOSIS — I5033 Acute on chronic diastolic (congestive) heart failure: Secondary | ICD-10-CM | POA: Diagnosis not present

## 2023-11-14 DIAGNOSIS — I69151 Hemiplegia and hemiparesis following nontraumatic intracerebral hemorrhage affecting right dominant side: Secondary | ICD-10-CM | POA: Diagnosis not present

## 2023-11-14 DIAGNOSIS — J439 Emphysema, unspecified: Secondary | ICD-10-CM | POA: Diagnosis not present

## 2023-11-14 DIAGNOSIS — I69120 Aphasia following nontraumatic intracerebral hemorrhage: Secondary | ICD-10-CM | POA: Diagnosis not present

## 2023-11-14 DIAGNOSIS — I13 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 failure and stage 1 through stage 4 chronic kidney disease, or unspecified chronic kidney disease: Secondary | ICD-10-CM | POA: Diagnosis not present

## 2023-11-14 LAB — URINE CULTURE: Culture: 80000 — AB

## 2023-11-15 ENCOUNTER — Other Ambulatory Visit (HOSPITAL_COMMUNITY): Payer: Self-pay

## 2023-11-15 ENCOUNTER — Telehealth (HOSPITAL_BASED_OUTPATIENT_CLINIC_OR_DEPARTMENT_OTHER): Payer: Self-pay

## 2023-11-15 ENCOUNTER — Other Ambulatory Visit: Payer: Self-pay

## 2023-11-15 ENCOUNTER — Other Ambulatory Visit: Payer: Self-pay | Admitting: Family Medicine

## 2023-11-15 DIAGNOSIS — R34 Anuria and oliguria: Secondary | ICD-10-CM

## 2023-11-15 MED ORDER — LEVOFLOXACIN 250 MG PO TABS
250.0000 mg | ORAL_TABLET | Freq: Every day | ORAL | 0 refills | Status: DC
  Filled 2023-11-15 (×3): qty 5, 5d supply, fill #0

## 2023-11-15 NOTE — Telephone Encounter (Signed)
 Post ED Visit - Positive Culture Follow-up: Successful Patient Follow-Up  Culture assessed and recommendations reviewed by:  []  Rankin Dee, Pharm.D. [x]  Venetia Gully, Pharm.D., BCPS AQ-ID []  Garrel Crews, Pharm.D., BCPS []  Almarie Lunger, Pharm.D., BCPS []  [place], [address].D., BCPS, AAHIVP []  Rosaline Bihari, Pharm.D., BCPS, AAHIVP []  Vernell Meier, PharmD, BCPS []  Latanya Hint, PharmD, BCPS []  Donald Medley, PharmD, BCPS []  Rocky Bold, PharmD  Positive urine culture  []  Patient discharged without antimicrobial prescription and treatment is now indicated [x]  Organism is resistant to prescribed ED discharge antimicrobial []  Patient with positive blood cultures  Plan: call pt for s/s check, if no s/s stop Cephalexin  and no abx needed. If having s/s stop Cephalexin  and start Levofloxacin  250 mg po daily x 5 days.   Spoke with pt daughter, due to fact that pt has indwelling foley catheter, daughter is not sure if he is having symptoms. Pt does c/o urgency. Levofloxacin  called into [facility] outpt pharmacy, per daughters request.   Changes discussed with ED provider: [name], DO New antibiotic prescription Levofloxacin  250 mg po daily x 5 days Called to [facility] outpt pharmacy  Contacted patient Daughter date 11/15/2023, time 10:15 am   Evan [name] 11/15/2023, 10:18 AM

## 2023-11-15 NOTE — Progress Notes (Signed)
 ED Antimicrobial Stewardship Positive Culture Follow Up   Evan [name] is an 89 y.o. male who presented to [facility] on 11/10/2023 with a chief complaint of  Chief Complaint  Patient presents with   Fever    Recent Results (from the past 720 hours)  Resp panel by RT-PCR (RSV, Flu A&B, Covid) Anterior Nasal Swab     Status: None   Collection Time: 11/10/23 10:22 PM   Specimen: Anterior Nasal Swab  Result Value Ref Range Status   SARS Coronavirus 2 by RT PCR NEGATIVE NEGATIVE Final   Influenza A by PCR NEGATIVE NEGATIVE Final   Influenza B by PCR NEGATIVE NEGATIVE Final    Comment: (NOTE) The Xpert Xpress SARS-CoV-2/FLU/RSV plus assay is intended as an aid in the diagnosis of influenza from Nasopharyngeal swab specimens and should not be used as a sole basis for treatment. Nasal washings and aspirates are unacceptable for Xpert Xpress SARS-CoV-2/FLU/RSV testing.  Fact Sheet for Patients: BloggerCourse.com  Fact Sheet for Healthcare Providers: SeriousBroker.it  This test is not yet approved or cleared by the [place]  FDA and has been authorized for detection and/or diagnosis of SARS-CoV-2 by FDA under an Emergency Use Authorization (EUA). This EUA will remain in effect (meaning this test can be used) for the duration of the COVID-19 declaration under Section 564(b)(1) of the Act, 21 U.S.C. section 360bbb-3(b)(1), unless the authorization is terminated or revoked.     Resp Syncytial Virus by PCR NEGATIVE NEGATIVE Final    Comment: (NOTE) Fact Sheet for Patients: BloggerCourse.com  Fact Sheet for Healthcare Providers: SeriousBroker.it  This test is not yet approved or cleared by the [place]  FDA and has been authorized for detection and/or diagnosis of SARS-CoV-2 by FDA under an Emergency Use Authorization (EUA). This EUA will remain in effect (meaning this test can  be used) for the duration of the COVID-19 declaration under Section 564(b)(1) of the Act, 21 U.S.C. section 360bbb-3(b)(1), unless the authorization is terminated or revoked.  Performed at [facility], [address] [place], [place] [address]   Blood culture (routine x 2)     Status: None (Preliminary result)   Collection Time: 11/10/23 10:31 PM   Specimen: BLOOD LEFT ARM  Result Value Ref Range Status   Specimen Description BLOOD LEFT ARM  Final   Special Requests   Final    BOTTLES DRAWN AEROBIC AND ANAEROBIC Blood Culture adequate volume   Culture   Final    NO GROWTH 4 DAYS Performed at [facility], [address] [place], [place] [address]    Report Status PENDING  Incomplete  Blood culture (routine x 2)     Status: None (Preliminary result)   Collection Time: 11/10/23 10:31 PM   Specimen: BLOOD LEFT HAND  Result Value Ref Range Status   Specimen Description BLOOD LEFT HAND  Final   Special Requests   Final    BOTTLES DRAWN AEROBIC AND ANAEROBIC Blood Culture adequate volume   Culture   Final    NO GROWTH 4 DAYS Performed at [facility], [address] [place], [place] [address]    Report Status PENDING  Incomplete  Urine Culture     Status: Abnormal   Collection Time: 11/11/23 12:49 AM   Specimen: Urine, Random  Result Value Ref Range Status   Specimen Description URINE, RANDOM  Final   Special Requests   Final    NONE Reflexed from [id number] Performed at [facility]  Lab, [address], [place], [place] [address]    Culture   Final    Two isolates with different morphologies were identified as the same organism.The most resistant organism was reported. 80,000 COLONIES/mL PSEUDOMONAS AERUGINOSA    Report Status 11/14/2023 FINAL  Final   Organism ID, Bacteria PSEUDOMONAS AERUGINOSA (A)  Final      Susceptibility   Pseudomonas aeruginosa - MIC*    MEROPENEM 1 SENSITIVE Sensitive     CIPROFLOXACIN  <=0.06 SENSITIVE Sensitive     IMIPENEM 1  SENSITIVE Sensitive     PIP/TAZO Value in next row Sensitive ug/mL     16 SENSITIVEThis is a modified FDA-approved test that has been validated and its performance characteristics determined by the reporting laboratory.  This laboratory is certified under the Clinical Laboratory Improvement Amendments CLIA as qualified to perform high complexity clinical laboratory testing.    CEFEPIME  Value in next row Sensitive      16 SENSITIVEThis is a modified FDA-approved test that has been validated and its performance characteristics determined by the reporting laboratory.  This laboratory is certified under the Clinical Laboratory Improvement Amendments CLIA as qualified to perform high complexity clinical laboratory testing.    CEFTAZIDIME/AVIBACTAM Value in next row Sensitive ug/mL     16 SENSITIVEThis is a modified FDA-approved test that has been validated and its performance characteristics determined by the reporting laboratory.  This laboratory is certified under the Clinical Laboratory Improvement Amendments CLIA as qualified to perform high complexity clinical laboratory testing.    CEFTOLOZANE/TAZOBACTAM Value in next row Sensitive ug/mL     16 SENSITIVEThis is a modified FDA-approved test that has been validated and its performance characteristics determined by the reporting laboratory.  This laboratory is certified under the Clinical Laboratory Improvement Amendments CLIA as qualified to perform high complexity clinical laboratory testing.    TOBRAMYCIN Value in next row Sensitive      16 SENSITIVEThis is a modified FDA-approved test that has been validated and its performance characteristics determined by the reporting laboratory.  This laboratory is certified under the Clinical Laboratory Improvement Amendments CLIA as qualified to perform high complexity clinical laboratory testing.    CEFTAZIDIME Value in next row Sensitive      16 SENSITIVEThis is a modified FDA-approved test that has been  validated and its performance characteristics determined by the reporting laboratory.  This laboratory is certified under the Clinical Laboratory Improvement Amendments CLIA as qualified to perform high complexity clinical laboratory testing.    * 80,000 COLONIES/mL PSEUDOMONAS AERUGINOSA    [x]  Treated with cephalexin , organism resistant to prescribed antimicrobial  Plan: Call patient to inform them to stop taking cephalexin . If patient denies urinary symptoms, no antibiotics are indicated at this time. If patient endorses urinary symptoms, levofloxacin  will be started.  New antibiotic prescription: levofloxacin  250mg  by mouth daily for 5 days If patient endorses urinary symptoms  ED Provider: [name], DO   Jacqulyn Barresi 11/15/2023, 8:58 AM Clinical Pharmacist Monday - Friday phone -  [phone] Saturday - Sunday phone - [phone]

## 2023-11-15 NOTE — Progress Notes (Signed)
 ED Antimicrobial Stewardship Positive Culture Follow Up   Evan [name] is an 89 y.o. male who presented to [facility] on 11/10/2023 with a chief complaint of  Chief Complaint  Patient presents with   Fever    Recent Results (from the past 720 hours)  Resp panel by RT-PCR (RSV, Flu A&B, Covid) Anterior Nasal Swab     Status: None   Collection Time: 11/10/23 10:22 PM   Specimen: Anterior Nasal Swab  Result Value Ref Range Status   SARS Coronavirus 2 by RT PCR NEGATIVE NEGATIVE Final   Influenza A by PCR NEGATIVE NEGATIVE Final   Influenza B by PCR NEGATIVE NEGATIVE Final    Comment: (NOTE) The Xpert Xpress SARS-CoV-2/FLU/RSV plus assay is intended as an aid in the diagnosis of influenza from Nasopharyngeal swab specimens and should not be used as a sole basis for treatment. Nasal washings and aspirates are unacceptable for Xpert Xpress SARS-CoV-2/FLU/RSV testing.  Fact Sheet for Patients: BloggerCourse.com  Fact Sheet for Healthcare Providers: SeriousBroker.it  This test is not yet approved or cleared by the [place]  FDA and has been authorized for detection and/or diagnosis of SARS-CoV-2 by FDA under an Emergency Use Authorization (EUA). This EUA will remain in effect (meaning this test can be used) for the duration of the COVID-19 declaration under Section 564(b)(1) of the Act, 21 U.S.C. section 360bbb-3(b)(1), unless the authorization is terminated or revoked.     Resp Syncytial Virus by PCR NEGATIVE NEGATIVE Final    Comment: (NOTE) Fact Sheet for Patients: BloggerCourse.com  Fact Sheet for Healthcare Providers: SeriousBroker.it  This test is not yet approved or cleared by the [place]  FDA and has been authorized for detection and/or diagnosis of SARS-CoV-2 by FDA under an Emergency Use Authorization (EUA). This EUA will remain in effect (meaning this test can  be used) for the duration of the COVID-19 declaration under Section 564(b)(1) of the Act, 21 U.S.C. section 360bbb-3(b)(1), unless the authorization is terminated or revoked.  Performed at [facility], [address] [place], [place] [address]   Blood culture (routine x 2)     Status: None (Preliminary result)   Collection Time: 11/10/23 10:31 PM   Specimen: BLOOD LEFT ARM  Result Value Ref Range Status   Specimen Description BLOOD LEFT ARM  Final   Special Requests   Final    BOTTLES DRAWN AEROBIC AND ANAEROBIC Blood Culture adequate volume   Culture   Final    NO GROWTH 4 DAYS Performed at [facility], [address] [place], [place] [address]    Report Status PENDING  Incomplete  Blood culture (routine x 2)     Status: None (Preliminary result)   Collection Time: 11/10/23 10:31 PM   Specimen: BLOOD LEFT HAND  Result Value Ref Range Status   Specimen Description BLOOD LEFT HAND  Final   Special Requests   Final    BOTTLES DRAWN AEROBIC AND ANAEROBIC Blood Culture adequate volume   Culture   Final    NO GROWTH 4 DAYS Performed at [facility], [address], [place], [place] [address]    Report Status PENDING  Incomplete  Urine Culture     Status: Abnormal   Collection Time: 11/11/23 12:49 AM   Specimen: Urine, Random  Result Value Ref Range Status   Specimen Description URINE, RANDOM  Final   Special Requests   Final    NONE Reflexed from [id number] Performed at [facility]  Lab, [address], [place], [place] [address]    Culture   Final    Two isolates with different morphologies were identified as the same organism.The most resistant organism was reported. 80,000 COLONIES/mL PSEUDOMONAS AERUGINOSA    Report Status 11/14/2023 FINAL  Final   Organism ID, Bacteria PSEUDOMONAS AERUGINOSA (A)  Final      Susceptibility   Pseudomonas aeruginosa - MIC*    MEROPENEM 1 SENSITIVE Sensitive     CIPROFLOXACIN  <=0.06 SENSITIVE Sensitive     IMIPENEM 1  SENSITIVE Sensitive     PIP/TAZO Value in next row Sensitive ug/mL     16 SENSITIVEThis is a modified FDA-approved test that has been validated and its performance characteristics determined by the reporting laboratory.  This laboratory is certified under the Clinical Laboratory Improvement Amendments CLIA as qualified to perform high complexity clinical laboratory testing.    CEFEPIME  Value in next row Sensitive      16 SENSITIVEThis is a modified FDA-approved test that has been validated and its performance characteristics determined by the reporting laboratory.  This laboratory is certified under the Clinical Laboratory Improvement Amendments CLIA as qualified to perform high complexity clinical laboratory testing.    CEFTAZIDIME/AVIBACTAM Value in next row Sensitive ug/mL     16 SENSITIVEThis is a modified FDA-approved test that has been validated and its performance characteristics determined by the reporting laboratory.  This laboratory is certified under the Clinical Laboratory Improvement Amendments CLIA as qualified to perform high complexity clinical laboratory testing.    CEFTOLOZANE/TAZOBACTAM Value in next row Sensitive ug/mL     16 SENSITIVEThis is a modified FDA-approved test that has been validated and its performance characteristics determined by the reporting laboratory.  This laboratory is certified under the Clinical Laboratory Improvement Amendments CLIA as qualified to perform high complexity clinical laboratory testing.    TOBRAMYCIN Value in next row Sensitive      16 SENSITIVEThis is a modified FDA-approved test that has been validated and its performance characteristics determined by the reporting laboratory.  This laboratory is certified under the Clinical Laboratory Improvement Amendments CLIA as qualified to perform high complexity clinical laboratory testing.    CEFTAZIDIME Value in next row Sensitive      16 SENSITIVEThis is a modified FDA-approved test that has been  validated and its performance characteristics determined by the reporting laboratory.  This laboratory is certified under the Clinical Laboratory Improvement Amendments CLIA as qualified to perform high complexity clinical laboratory testing.    * 80,000 COLONIES/mL PSEUDOMONAS AERUGINOSA    [x]  Treated with cephalexin , organism resistant to prescribed antimicrobial  Plan: Call patient to inform them to stop taking cephalexin . If patient denies urinary symptoms, no antibiotics are indicated at this time. If patient endorses urinary symptoms, levofloxacin  will be started.  New antibiotic prescription: levofloxacin  250mg  by mouth daily for 5 days If patient endorses urinary symptoms  ED Provider: [name], DO   Khalifa Knecht 11/15/2023, 8:58 AM Clinical Pharmacist Monday - Friday phone -  [phone] Saturday - Sunday phone - [phone]

## 2023-11-16 ENCOUNTER — Other Ambulatory Visit (HOSPITAL_COMMUNITY): Payer: Self-pay

## 2023-11-16 DIAGNOSIS — I69151 Hemiplegia and hemiparesis following nontraumatic intracerebral hemorrhage affecting right dominant side: Secondary | ICD-10-CM | POA: Diagnosis not present

## 2023-11-16 DIAGNOSIS — I5033 Acute on chronic diastolic (congestive) heart failure: Secondary | ICD-10-CM | POA: Diagnosis not present

## 2023-11-16 DIAGNOSIS — N1832 Chronic kidney disease, stage 3b: Secondary | ICD-10-CM | POA: Diagnosis not present

## 2023-11-16 DIAGNOSIS — I69120 Aphasia following nontraumatic intracerebral hemorrhage: Secondary | ICD-10-CM | POA: Diagnosis not present

## 2023-11-16 DIAGNOSIS — I5022 Chronic systolic (congestive) heart failure: Secondary | ICD-10-CM | POA: Diagnosis not present

## 2023-11-16 DIAGNOSIS — J439 Emphysema, unspecified: Secondary | ICD-10-CM | POA: Diagnosis not present

## 2023-11-16 DIAGNOSIS — J690 Pneumonitis due to inhalation of food and vomit: Secondary | ICD-10-CM | POA: Diagnosis not present

## 2023-11-16 DIAGNOSIS — J9611 Chronic respiratory failure with hypoxia: Secondary | ICD-10-CM | POA: Diagnosis not present

## 2023-11-16 DIAGNOSIS — I13 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 failure and stage 1 through stage 4 chronic kidney disease, or unspecified chronic kidney disease: Secondary | ICD-10-CM | POA: Diagnosis not present

## 2023-11-16 LAB — CULTURE, BLOOD (ROUTINE X 2)
Culture: NO GROWTH
Culture: NO GROWTH
Special Requests: ADEQUATE
Special Requests: ADEQUATE

## 2023-11-20 ENCOUNTER — Other Ambulatory Visit (HOSPITAL_COMMUNITY): Payer: Self-pay

## 2023-11-21 DIAGNOSIS — I69151 Hemiplegia and hemiparesis following nontraumatic intracerebral hemorrhage affecting right dominant side: Secondary | ICD-10-CM | POA: Diagnosis not present

## 2023-11-21 DIAGNOSIS — I69120 Aphasia following nontraumatic intracerebral hemorrhage: Secondary | ICD-10-CM | POA: Diagnosis not present

## 2023-11-21 DIAGNOSIS — J690 Pneumonitis due to inhalation of food and vomit: Secondary | ICD-10-CM | POA: Diagnosis not present

## 2023-11-21 DIAGNOSIS — J9611 Chronic respiratory failure with hypoxia: Secondary | ICD-10-CM | POA: Diagnosis not present

## 2023-11-21 DIAGNOSIS — N1832 Chronic kidney disease, stage 3b: Secondary | ICD-10-CM | POA: Diagnosis not present

## 2023-11-21 DIAGNOSIS — I5022 Chronic systolic (congestive) heart failure: Secondary | ICD-10-CM | POA: Diagnosis not present

## 2023-11-21 DIAGNOSIS — I13 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 failure and stage 1 through stage 4 chronic kidney disease, or unspecified chronic kidney disease: Secondary | ICD-10-CM | POA: Diagnosis not present

## 2023-11-21 DIAGNOSIS — J439 Emphysema, unspecified: Secondary | ICD-10-CM | POA: Diagnosis not present

## 2023-11-21 DIAGNOSIS — I5033 Acute on chronic diastolic (congestive) heart failure: Secondary | ICD-10-CM | POA: Diagnosis not present

## 2023-11-28 ENCOUNTER — Other Ambulatory Visit: Payer: Self-pay | Admitting: Licensed Clinical Social Worker

## 2023-11-28 DIAGNOSIS — I69151 Hemiplegia and hemiparesis following nontraumatic intracerebral hemorrhage affecting right dominant side: Secondary | ICD-10-CM | POA: Diagnosis not present

## 2023-11-28 DIAGNOSIS — J690 Pneumonitis due to inhalation of food and vomit: Secondary | ICD-10-CM | POA: Diagnosis not present

## 2023-11-28 DIAGNOSIS — J439 Emphysema, unspecified: Secondary | ICD-10-CM | POA: Diagnosis not present

## 2023-11-28 DIAGNOSIS — I5022 Chronic systolic (congestive) heart failure: Secondary | ICD-10-CM | POA: Diagnosis not present

## 2023-11-28 DIAGNOSIS — I69120 Aphasia following nontraumatic intracerebral hemorrhage: Secondary | ICD-10-CM | POA: Diagnosis not present

## 2023-11-28 DIAGNOSIS — J9611 Chronic respiratory failure with hypoxia: Secondary | ICD-10-CM | POA: Diagnosis not present

## 2023-11-28 DIAGNOSIS — I5033 Acute on chronic diastolic (congestive) heart failure: Secondary | ICD-10-CM | POA: Diagnosis not present

## 2023-11-28 DIAGNOSIS — N1832 Chronic kidney disease, stage 3b: Secondary | ICD-10-CM | POA: Diagnosis not present

## 2023-11-28 DIAGNOSIS — I13 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 failure and stage 1 through stage 4 chronic kidney disease, or unspecified chronic kidney disease: Secondary | ICD-10-CM | POA: Diagnosis not present

## 2023-11-28 NOTE — Patient Instructions (Signed)
 Visit Information  Thank you for taking time to visit with me today. Please don't hesitate to contact me if I can be of assistance to you before our next scheduled appointment.  Your next care management appointment is by telephone on 09/23 at 1 PM  Please call the care guide team at [phone] if you need to cancel, schedule, or reschedule an appointment.   Please call the Suicide and Crisis Lifeline: 988 go to [facility] [address], [place] [phone]) call 911 if you are experiencing a Mental Health or Behavioral Health Crisis or need someone to talk to.  Rolin Kerns, LCSW [place]  [facility], [facility] Clinical Social Worker Direct Dial: [phone]  Fax: [phone] Website: [email or web address] 3:24 PM

## 2023-11-28 NOTE — Patient Outreach (Signed)
 Complex Care Management   Visit Note  11/28/2023  Name:  Evan [name] MRN: [id number] DOB: 12-28-1927  Situation: Referral received for Complex Care Management related to Mental/Behavioral Health diagnosis GAD/Depression and Dementia I obtained verbal consent from Caregiver.  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   Altered mental status 08/21/2018   Anginal chest pain at rest [facility])    Chronic non, controlled on Lorazepam    Anxiety    Arthritis    all over    BPH (benign prostatic hyperplasia)    CAD (coronary artery disease)    Cellulitis of arm, right 06/01/2018   Cellulitis of left axilla    Chronic lower back pain    Complication of anesthesia    problems making water afterwards   Depression    DJD (degenerative joint disease)    Esophageal ulcer without bleeding    bid ppi indefinitely   Fall 07/16/2018   Family history of adverse reaction to anesthesia    children w/PONV   GERD (gastroesophageal reflux disease)    Haemophilus influenzae septicemia (HCC) 03/12/2023   Headache    couple/week maybe (09/04/2015)   Hemochromatosis    Possible, elevated iron  stores, hook-like osteophytes on hand films, normal LFTs   Hepatitis    yellow jaundice as a baby   History of ASCVD    MULTIVESSEL   Hyperlipidemia    Hypertension    Hypotension    Mild aortic stenosis 06/23/2021   Osteoarthritis    Peripheral vascular disease (HCC)    Pneumonia    several times; got a little now (09/04/2015)   Pulmonary embolism (HCC) 02/02/2018   Rash and nonspecific skin eruption 06/01/2018   Rheumatoid arthritis (HCC)    RSV (respiratory syncytial virus pneumonia) 05/15/2022   Stroke (HCC)     Subdural hematoma (HCC)    small after fall 09/2016-plavix  held, neurosurgery consulted.  Asymptomatic.     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 PO Eliquis     Assessment: Patient Reported Symptoms:  Cognitive Cognitive Status: Confused or disoriented, Able to follow simple commands, Normal speech and language skills   Health Maintenance Behaviors: Annual physical exam  Neurological Neurological Review of Symptoms: No symptoms reported    HEENT HEENT Symptoms Reported: No symptoms reported      Cardiovascular Cardiovascular Symptoms Reported: No symptoms reported    Respiratory Respiratory Symptoms Reported: No symptoms reported Additional Respiratory Details: pt uses oxygen  at night Respiratory Management Strategies: Coping strategies, Oxygen  therapy  Endocrine Endocrine Symptoms Reported: No symptoms reported    Gastrointestinal Gastrointestinal Symptoms Reported: No symptoms reported      Genitourinary Genitourinary Symptoms Reported: No symptoms reported Additional Genitourinary Details: Pt has a catheter Genitourinary Management Strategies: Medical device  Integumentary Integumentary Symptoms Reported: Wound Additional Integumentary Details: Pt has a pressure sore on his bottom Skin Management Strategies: Dressing changes, Routine screening  Musculoskeletal Musculoskelatal Symptoms Reviewed: Limited mobility Additional Musculoskeletal Details: Pt is participating in [facility]. Family reports he is gaining strength Musculoskeletal Management Strategies: Coping strategies, Routine screening, Exercise Falls in the past year?: No Number of falls in past year: 1 or less Was there an injury with Fall?: No Fall Risk Category Calculator: 0 Patient Fall  Risk Level: Low Fall Risk    Psychosocial Psychosocial Symptoms Reported: No symptoms reported Behavioral Management Strategies: Coping strategies Major Change/Loss/Stressor/Fears (CP): Medical  condition, self Quality of Family Relationships: involved, supportive, helpful    11/28/2023    PHQ2-9 Depression Screening   Little interest or pleasure in doing things    Feeling down, depressed, or hopeless    PHQ-2 - Total Score    Trouble falling or staying asleep, or sleeping too much    Feeling tired or having little energy    Poor appetite or overeating     Feeling bad about yourself - or that you are a failure or have let yourself or your family down    Trouble concentrating on things, such as reading the newspaper or watching television    Moving or speaking so slowly that other people could have noticed.  Or the opposite - being so fidgety or restless that you have been moving around a lot more than usual    Thoughts that you would be better off dead, or hurting yourself in some way    PHQ2-9 Total Score    If you checked off any problems, how difficult have these problems made it for you to do your work, take care of things at home, or get along with other people    Depression Interventions/Treatment      There were no vitals filed for this visit.  Medications Reviewed Today     Reviewed by Zyana Amaro D, LCSW (Social Worker) on 11/28/23 at 1516  Med List Status: <None>   Medication Order Taking? Sig Documenting Provider Last Dose Status Informant  acetaminophen  (TYLENOL ) 325 MG tablet [id number]  Take 325-650 mg by mouth 2 (two) times daily as needed for mild pain (pain score 1-3), moderate pain (pain score 4-6), fever or headache. [provider]  Active Child, Pharmacy Records  amoxicillin -clavulanate (AUGMENTIN ) 875-125 MG tablet [id number]  Take 1 tablet by mouth 2 (two) times daily. [name], MD  Active   apixaban  (ELIQUIS ) 2.5 MG TABS tablet [id number]  TAKE 1 TABLET TWICE DAILY Varanasi, Jayadeep S, MD  Active   budesonide -formoterol  (SYMBICORT ) 160-4.5 MCG/ACT inhaler [id number]  Inhale 2 puffs into the lungs in the morning and at bedtime. [name], MD  Active Child, Pharmacy Records  cephALEXin  (KEFLEX ) 500 MG capsule [id number]  Take 1 capsule (500 mg total) by mouth 2 (two) times daily. Jerral Meth, MD  Active   colchicine  0.6 MG tablet [id number]  Take 1 tablet (0.6 mg total) by mouth daily. [name], MD  Active   FEROSUL 325 [phone] Fe) MG tablet [id number]  TAKE 1 TABLET EVERY DAY [name], MD  Active   FLUoxetine  (PROZAC ) 20 MG capsule [id number]  TAKE 1 CAPSULE EVERY DAY [name], MD  Active Child, Pharmacy Records  folic acid  (FOLVITE ) 1 MG tablet [id number]  Take 1 tablet (1 mg total) by mouth daily. [name], MD  Active   furosemide  (LASIX ) 40 MG tablet [id number]  TAKE 1 TABLET EVERY DAY [name], MD  Active Child, Pharmacy Records  hydrOXYzine  (ATARAX ) 10 MG/5ML syrup [id number]  Take 5 mLs (10 mg total) by mouth every 8 (eight) hours as needed for itching. [name], [name], MD  Active Child, Pharmacy Records  levofloxacin  (LEVAQUIN ) 250 MG tablet [id number]  Take 1 tablet (250 mg total) by mouth daily for 5 days. Neysa Caron PARAS, DO  Active   melatonin 3 MG  TABS tablet [id number]  Take 1 tablet (3 mg total) by mouth at bedtime as needed (insomnia). [name], MD  Active   metoprolol  tartrate (LOPRESSOR ) 25 MG tablet [id number]  Take 25 mg by mouth daily. [provider]  Active Child, Pharmacy Records  Multiple Vitamins-Minerals (MENS 50+ MULTIVITAMIN) TABS [id number]  Take 1 tablet by mouth daily. [provider]  Active Child, Pharmacy Records  mupirocin  ointment (BACTROBAN ) 2 % [id number]  Apply 1 Application topically 2 (two) times daily. Hyatt, [name], DPM  Active Child, Pharmacy Records  nitroGLYCERIN  (NITROSTAT ) 0.4 MG SL tablet [id number]  Place 1 tablet (0.4 mg total) under the tongue every 5 (five) minutes as needed for chest pain. [name], MD  Active Child, Pharmacy Records           Med Note [place], [name]   Sat Sep 09, 2023 10:35 PM)     nystatin  (MYCOSTATIN /NYSTOP ) powder [id number]  Apply a thin layer topically to the affected area 2-3 (two to three) times daily.   Active Child, Pharmacy Records  pantoprazole  (PROTONIX ) 40 MG tablet [id number]  TAKE 1 TABLET EVERY MORNING Pickard, Butler DASEN, MD  Active Child, Pharmacy Records  potassium chloride  SA (KLOR-CON  M) 20 MEQ tablet [id number]  Take 1 tablet (20 mEq total) by mouth daily. [name], MD  Active   predniSONE  (DELTASONE ) 20 MG tablet [id number]  3 tabs poqday 1-2, 2 tabs poqday 3-4, 1 tab poqday 5-6 [name], MD  Active   rosuvastatin  (CRESTOR ) 5 MG tablet [id number]  Take 1 tablet (5 mg total) by mouth at bedtime. [name], [name], MD  Active Child, Pharmacy Records  senna-docusate (STIMULANT LAXATIVE) 8.6-50 MG tablet [id number]  Take 2 tablets by mouth 2 (two) times daily as needed for constipation   Active Child, Pharmacy Records  tamsulosin  (FLOMAX ) 0.4 MG CAPS capsule [id number]  TAKE 1 CAPSULE EVERY DAY WITH SUPPER [name], MD  Active   Zinc  Oxide (TRIPLE PASTE) 12.8 % ointment [id number]  Apply topically 4 (four) times daily as needed (skin barrier). [name]., MD  Active Child, Pharmacy Records            Recommendation:   DME requests:  other Hospital Bed Mattress Continue Current Plan of Care  Follow Up Plan:   Telephone follow-up in 1 month  Rolin Kerns, LCSW [facility]  [facility], [facility] Clinical Social Worker Direct Dial: [phone]  Fax: [phone] Website: [email or web address] 3:24 PM

## 2023-11-30 ENCOUNTER — Other Ambulatory Visit: Payer: Self-pay | Admitting: Family Medicine

## 2023-11-30 DIAGNOSIS — L89302 Pressure ulcer of unspecified buttock, stage 2: Secondary | ICD-10-CM

## 2023-12-01 ENCOUNTER — Telehealth: Payer: Self-pay

## 2023-12-01 NOTE — Telephone Encounter (Signed)
 Order for air overlay mattress faxed to [facility] with notes. Fax # [phone]. Mjp,lpn

## 2023-12-06 ENCOUNTER — Ambulatory Visit: Admitting: Neurology

## 2023-12-06 ENCOUNTER — Encounter: Payer: Self-pay | Admitting: Neurology

## 2023-12-06 VITALS — BP 100/56 | HR 84 | Wt 140.0 lb

## 2023-12-06 DIAGNOSIS — F01518 Vascular dementia, unspecified severity, with other behavioral disturbance: Secondary | ICD-10-CM

## 2023-12-06 DIAGNOSIS — I639 Cerebral infarction, unspecified: Secondary | ICD-10-CM | POA: Diagnosis not present

## 2023-12-06 DIAGNOSIS — G8110 Spastic hemiplegia affecting unspecified side: Secondary | ICD-10-CM

## 2023-12-06 DIAGNOSIS — Z8673 Personal history of transient ischemic attack (TIA), and cerebral infarction without residual deficits: Secondary | ICD-10-CM | POA: Diagnosis not present

## 2023-12-06 DIAGNOSIS — F03911 Unspecified dementia, unspecified severity, with agitation: Secondary | ICD-10-CM | POA: Diagnosis not present

## 2023-12-06 DIAGNOSIS — I482 Chronic atrial fibrillation, unspecified: Secondary | ICD-10-CM

## 2023-12-06 NOTE — Patient Instructions (Signed)
 I had a long d/w patient and his daughter and son-in-law about his recent embolic strokes, chronic atrial fibrillation, history of thalamic hemorrhage and spastic right hemiplegia new cognitive issues and vascular dementia, risk for recurrent stroke/TIAs, personally independently reviewed imaging studies and stroke evaluation results and answered questions.Continue Eliquis  low-dose 2.5 twice daily given remote history of intracerebral hemorrhage for secondary stroke prevention for his A-fib and maintain strict control of hypertension with blood pressure goal below 130/90, diabetes with hemoglobin A1c goal below 6.5% and lipids with LDL cholesterol goal below 70 mg/dL. I also advised the patient to eat a healthy diet with plenty of whole grains, cereals, fruits and vegetables, exercise regularly and maintain ideal body weight .patient is having cognitive worsening since his strokes and so we will check lab work for reversible causes of memory loss.  Continue home physical Occupational Therapy.  Recommend melatonin 5 mg at bedtime.  Regulate sleep-wake cycle and follow-up with primary care physician for management of agitation and medical issues.  Followup in the future with me only as needed and a scheduled appointment was made.  Dementia Caregiver Guide Dementia is a condition that affects the way the brain works. It often affects thinking and memory. A person with dementia may: Forget things. Have trouble talking or responding to your questions. Have trouble paying attention. Have trouble thinking clearly and making good decisions. Get lost or wander away from home or other places. Have big changes in their mood or emotions. They may: Feel very worried, nervous, or depressed. Have angry outbursts. Be suspicious or accuse you of things. Have childlike behavior and language. Taking care of someone with dementia can be a challenge. The tips below can help you care for the person. How to help manage  lifestyle changes Dementia usually gets worse slowly over time. In the early stages, people with dementia can stay safe and take care of themselves with some help. In later stages, they need help with daily tasks like getting dressed, grooming, and going to the bathroom. Communicating When the person is talking and seems frustrated, make eye contact and hold the person's hand. Ask questions that can be answered with a yes or no. Use simple words and a calm voice. Only give one direction at a time. Limit choices for the person. Too many choices can be stressful. Avoid correcting the person in a negative way. If the person can't find the right words, gently try to help. Preventing injury  Keep floors clear. Remove rugs, magazine racks, and floor lamps. Keep hallways well lit, especially at night. Put a handrail and nonslip mat in the bathtub or shower. Put childproof locks on cabinets that have dangerous items in them. These items include medicine, alcohol , guns, cleaning products, and sharp tools. For doors to the outside, put locks where the person can't see or reach them. This helps keep the person from going out of the house and getting lost. Be ready for emergencies. Keep a list of emergency phone numbers and addresses close by. Remove car keys and lock garage doors so the person doesn't try to drive. Have the person wear a bracelet that tracks where they are and shows that they're a person with memory loss. This should be worn at all times for safety. Helping with daily life  Keep the person on track with their daily routine. Try to identify areas where the person may need help. Be supportive, patient, calm, and encouraging. Gently remind the person that adjusting to changes takes time.  Help with the tasks that the person has asked for help with. Keep the person involved in daily tasks and decisions as much as you can. Encourage conversation, but try not to get frustrated if the person  struggles to find words or doesn't seem to appreciate your help. Other tips Think about any safety risks and take steps to avoid them. Keep things organized: Organize medicines in a pill box for each day of the week. Keep a calendar in a central place. Use it to remind the person of health care visits or other activities. Create a plan to handle any legal or financial matters. Get help from a professional if needed. Help make sure the person: Takes medicines only as told by their health care providers. Eats regular, healthy meals. They should also drink plenty of fluids. Goes to all scheduled health care appointments. Gets regular sleep. Taking care of yourself Being a caregiver for someone with dementia can be hard. You may feel stressed and have many other emotions. It's important to also take care of yourself. Here are some tips: Find out about services that can provide short-term care for the person. This is called respite care. It can allow you to take a break when you need one. Find healthy ways to deal with stress. Some ways include: Spending time with other people. Exercising. Meditating or doing deep breathing exercises. Take care of your own health by: Getting enough sleep. Eating healthy foods. Getting regular exercise. Join a support group with others who are caregivers. These groups can help you: Learn other ways to deal with stress. Share experiences with others. Get emotional comfort and support. Learn about caregiving as the disease gets worse. Find resources in your community. Where to find support: Many people and organizations offer support. These include: Support groups for people with dementia. Support groups for caregivers. Counselors or therapists. Home health care services. Adult day care centers. Where to find more information Alzheimer's Association: WesternTunes.it Family Caregiver Alliance: caregiver.org Alzheimer's Foundation of [place]: alzfdn.org Contact  a health care provider if: The person's health is quickly getting worse. You're no longer able to care for the person. Caring for the person is affecting your physical and emotional health. You're feeling worried, nervous, or depressed about caring for the person. Get help right away if: You feel like the person may hurt themselves or others. The person has talked about taking their own life. These symptoms may be an emergency. Take one of these steps right away: Go to your nearest emergency room. Call 911. Call the National Suicide Prevention Lifeline at 361-772-8872 or 988. Text the Crisis Text Line at 571-251-1399. This information is not intended to replace advice given to you by your health care provider. Make sure you discuss any questions you have with your health care provider. Document Revised: 07/01/2022 Document Reviewed: 07/01/2022 Elsevier Patient Education  2024 [employer].

## 2023-12-06 NOTE — Progress Notes (Signed)
 [facility] [address] [place] [address] [phone]       OFFICE CONSULT NOTE  Mr. CALVEN GILKES Date of Birth:  06/19/1927 Medical Record Number:  [id number]   Referring MD: [place]  Reason for Referral: Stroke  HPI: Mr. Ingrum is a 89 year old Caucasian male seen today for initial office consultation visit for stroke.  He is accompanied by his daughter and son-in-law.  History is obtained from them and review of electronic medical records and I personally reviewed pertinent available imaging films in PACS.  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  Anxiety, benign prostatic hypertrophy and chronic indwelling Foley, rheumatoid arthritis, hemochromatosis, chronic anemia and thrombocytopenia.  Patient presented on 09/22/2023 for evaluation of worsening vision difficulties and right hemiparesis.  NIH stroke scale was 19 on admission.  CT head showed no acute abnormality.  CT angiogram of the head and neck showed 50% right ICA and 60% left ICA stenosis.  MRI scan of the brain showed patchy small right MCA and left MCA/ACA watershed infarcts.  2D echo showed ejection fraction of 60 to 65%.  EEG showed diffuse encephalopathy no seizures.  LDL cholesterol 31 mg percent.  Hemoglobin A1c 5.7.  Urine drug screen was negative.  Patient was previously on home dose of Eliquis  2.5 twice daily for history of pulmonary embolism which was continued.  He was discharged home with home physical occupational therapy.  He is currently living at home and has 24-hour care provided by his family.  He was able to ambulate a bit prior to his stroke  but now is wheelchair-bound.  He has had worsening of his spastic right increases.  His appetite is poor and he has not been eating well.  He has had weight loss.  He has now been on home oxygen  the last 3 weeks for his COPD.  Family has also noticed cognitive worsening with increased confusion and disorientation.  He has also been doing a lot of sundowning.  He has trouble sleeping at night but Naps during the day.  He is tolerating Eliquis  2.5 twice daily without significant bruising or bleeding.  Patient has had intermittent agitations but can be redirected mostly.  He has had multiple recent admissions in May from Metapneurovirus pneumonia and heart failure exacerbation right upper extremity cellulitis and shortness of breath due to sepsis and acute respiratory failure with community-acquired pneumonia with urine culture growing Pseudomonas with multiple electrolyte derangements.  His general health has declined significantly in the last 3 to 4 months as per the family. Prior Office visits : Stroke admission 07/13/2018: Mr. PATRICH HEINZE is a 89 y.o. male with history of adenocarcinoma of the lung in remission, Htn, Hld, Hemochromatosis, CAD with stent, atrial fibrillation and PE on therapeutic Lovenox  (failed eliquis ), and hearing impaired found on the floor by family with right hemiparesis.  Stroke work-up revealed acute small ICH in the left thalamus likely hypertensive in the setting of therapeutic Lovenox  coagulopathy.  Initial CT imaging showed acute intraparenchymal hemorrhage centered at the left thalamus therefore he did not receive IV t-PA due to ICH.  CTA head and neck showed atheromatous change about the carotid bifurcations with up to 50% narrowing on the right and  65% narrowing in the left, moderate arthrosclerosis bilateral siphon, L>R, and overall diminutive vertebrobasilar system.  Repeat CT head showed stable hemorrhage left thalamus without change.  2D echo unremarkable.  Discontinued Lovenox   and no recommendation of antithrombotic given hemorrhage.  HTN stable.  HLD resume Crestor .  Other stroke risk factors include advanced age and CAD.  Prior history of fall with SDH in 2018.  Residual left hemiparesis and discharged to CIR for ongoing therapies.   Virtual visit 09/13/2018: He has been stable from a stroke standpoint with residual right hemiparesis with mild improvement. He continues to participate in home therapies. He in nonambulatory but able to stand with RW for about 20 seconds. Daughter reports generalized lower extremity weakness but right > left. He unfortunately presented to ED on 08/26/2018 with complaints of shortness of breath and prior admission on 08/21/2018 for acute metabolic encephalopathy attributed to E. coli UTI and hypoxia.  Recommended discharge to SNF but family refused and referral to palliative care with possibility of transitioning to hospice. Currently awaiting initial palliative consult. Continues on aspirin  with bleeding or bruising. Crestor  for cholesterol without myalgais. Blood pressure stable typically around 100-110/60. Daughter does endorse right foot pain with redness on top of foot. No swelling or warmth noted. He did have recent ED admission on 09/11/18 with left foot pain and negative for DVT. Dx with possible gout. He does wear foot drop brace at night on his right foot. No further concerns at this time. Denies new or worsening stroke/TIA symptoms.    Update 12/18/2018: Mr. Rauf is being seen today for stroke follow-up. Residual deficits of right sided weakness and coordination difficulties with daughter endorsing improvement.  He continues to work with therapy and has been able to ambulate with rolling walker short distances without great difficulty.  Denies any recent falls.  Uses wheelchair for long distance.  He continues on aspirin  81 mg without bleeding or bruising.  Daughter is questioning restarting of Lovenox  or anticoagulant as this was discontinued due  to ICH due to history of atrial fibrillation and PE.  No cardiology follow-up since discharge.  Continues on Crestor  without myalgias.  Blood pressure today satisfactory 123/78.  No further concerns at this time.   Update 07/04/2019: He continues to have R hemiparesis with apraxia and decreased sensation but has been stable with some improvement. He plans on restarting therapies next week. Is able to ambulate with RW and assistance but uses w/c for long distance.  Restarted on Xarelto  by cardiology and continues without bleeding or bruising. Continues on Crestor  without myalgias. Blood pressure 130/72.  No further neurological or stroke concerns at this time. ROS:   14 system review of systems is positive for weakness, difficulty walking, memory loss, confusion, disorientation, agitation, shortness of breath other systems negative  PMH:  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   Altered mental status 08/21/2018   Anginal chest pain at rest [facility])    Chronic non, controlled on Lorazepam    Anxiety    Arthritis    all over    BPH (benign prostatic hyperplasia)    CAD (coronary artery disease)    Cellulitis of arm, right 06/01/2018   Cellulitis of left axilla    Chronic lower back pain    Complication of anesthesia    problems  making water afterwards   Depression    DJD (degenerative joint disease)    Esophageal ulcer without bleeding    bid ppi indefinitely   Fall 07/16/2018   Family history of adverse reaction to anesthesia    children w/PONV   GERD (gastroesophageal reflux disease)    Haemophilus influenzae septicemia (HCC) 03/12/2023   Headache    couple/week maybe (09/04/2015)   Hemochromatosis    Possible, elevated iron  stores, hook-like osteophytes on hand  films, normal LFTs   Hepatitis    yellow jaundice as a baby   History of ASCVD    MULTIVESSEL   Hyperlipidemia    Hypertension    Hypotension    Mild aortic stenosis 06/23/2021   Osteoarthritis    Peripheral vascular disease (HCC)    Pneumonia    several times; got a little now (09/04/2015)   Pulmonary embolism (HCC) 02/02/2018   Rash and nonspecific skin eruption 06/01/2018   Rheumatoid arthritis (HCC)    RSV (respiratory syncytial virus pneumonia) 05/15/2022   Stroke (HCC)    Subdural hematoma (HCC)    small after fall 09/2016-plavix  held, neurosurgery consulted.  Asymptomatic.     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 PO Eliquis     Social History:  Social History   Socioeconomic History   Marital status: Widowed    Spouse name: Not on file   Number of children: 2   Years of education: 28   Highest education level: 12th grade  Occupational History   Not on file  Tobacco Use   Smoking status: Former    Types: Cigarettes    Passive exposure: Yes   Smokeless tobacco: Former    Types: Chew   Tobacco comments:    quit chewing in the 1960s    Verified by Daughter, [name]  Vaping Use   Vaping status: Never Used  Substance and Sexual Activity   Alcohol  use: No   Drug use: No   Sexual activity: Never  Other Topics Concern   Not on file  Social History Narrative   Not on file   Social Drivers of Health   Financial Resource Strain: Low Risk  (04/20/2023)   Overall Financial Resource Strain (CARDIA)    Difficulty of Paying Living Expenses: Not hard at all  Food Insecurity: No Food Insecurity (09/27/2023)   Hunger Vital Sign    Worried About Running Out of Food in the Last Year: Never true    Ran Out of Food in the Last Year: Never true  Transportation Needs: No Transportation Needs (09/27/2023)   PRAPARE - Administrator, Civil Service (Medical): No    Lack of Transportation (Non-Medical): No  Physical  Activity: Insufficiently Active (04/20/2023)   Exercise Vital Sign    Days of Exercise per Week: 5 days    Minutes of Exercise per Session: 10 min  Stress: No Stress Concern Present (04/20/2023)   [employer] of Occupational Health - Occupational Stress Questionnaire    Feeling of Stress : Not at all  Social Connections: Socially Isolated (09/23/2023)   Social Connection and Isolation Panel    Frequency of Communication with Friends and Family: Never    Frequency of Social Gatherings with Friends and Family: Twice a week    Attends Religious Services: Never    Database administrator or Organizations: No    Attends Banker Meetings: Never    Marital Status: Widowed  Intimate Partner Violence: Patient  Unable To Answer (09/27/2023)   Humiliation, Afraid, Rape, and Kick questionnaire    Fear of Current or Ex-Partner: Patient unable to answer    Emotionally Abused: Patient unable to answer    Physically Abused: Patient unable to answer    Sexually Abused: Patient unable to answer    Medications:   Current Outpatient Medications on File Prior to Visit  Medication Sig Dispense Refill   acetaminophen  (TYLENOL ) 325 MG tablet Take 325-650 mg by mouth 2 (two) times daily as needed for mild pain (pain score 1-3), moderate pain (pain score 4-6), fever or headache.     apixaban  (ELIQUIS ) 2.5 MG TABS tablet TAKE 1 TABLET TWICE DAILY 180 tablet 1   budesonide -formoterol  (SYMBICORT ) 160-4.5 MCG/ACT inhaler Inhale 2 puffs into the lungs in the morning and at bedtime. 10.2 g 12   colchicine  0.6 MG tablet Take 1 tablet (0.6 mg total) by mouth daily. 30 tablet 5   FEROSUL 325 (65 Fe) MG tablet TAKE 1 TABLET EVERY DAY 90 tablet 3   FLUoxetine  (PROZAC ) 20 MG capsule TAKE 1 CAPSULE EVERY DAY 90 capsule 3   folic acid  (FOLVITE ) 1 MG tablet Take 1 tablet (1 mg total) by mouth daily. 90 tablet 1   furosemide  (LASIX ) 40 MG tablet TAKE 1 TABLET EVERY DAY 90 tablet 3   hydrOXYzine  (ATARAX ) 10  MG/5ML syrup Take 5 mLs (10 mg total) by mouth every 8 (eight) hours as needed for itching. 30 mL 0   melatonin 3 MG TABS tablet Take 1 tablet (3 mg total) by mouth at bedtime as needed (insomnia). (Patient taking differently: Take 3 mg by mouth at bedtime.) 90 tablet 1   metoprolol  tartrate (LOPRESSOR ) 25 MG tablet Take 25 mg by mouth daily. (Patient taking differently: Take 12.5 mg by mouth daily.)     mupirocin  ointment (BACTROBAN ) 2 % Apply 1 Application topically 2 (two) times daily. (Patient taking differently: Apply 1 Application topically 2 (two) times daily as needed.) 22 g 0   nitroGLYCERIN  (NITROSTAT ) 0.4 MG SL tablet Place 1 tablet (0.4 mg total) under the tongue every 5 (five) minutes as needed for chest pain. 50 tablet 3   nystatin  (MYCOSTATIN /NYSTOP ) powder Apply a thin layer topically to the affected area 2-3 (two to three) times daily. (Patient taking differently: Apply 1 Application topically 2 (two) times daily as needed.) 15 g 0   OXYGEN  Inhale 2 L/min into the lungs as needed (Shortness of breath).     pantoprazole  (PROTONIX ) 40 MG tablet TAKE 1 TABLET EVERY MORNING 90 tablet 3   potassium chloride  SA (KLOR-CON  M) 20 MEQ tablet Take 1 tablet (20 mEq total) by mouth daily. 30 tablet 5   rosuvastatin  (CRESTOR ) 5 MG tablet Take 1 tablet (5 mg total) by mouth at bedtime.     senna-docusate (STIMULANT LAXATIVE) 8.6-50 MG tablet Take 2 tablets by mouth 2 (two) times daily as needed for constipation 60 tablet 2   tamsulosin  (FLOMAX ) 0.4 MG CAPS capsule TAKE 1 CAPSULE EVERY DAY WITH SUPPER 90 capsule 3   Zinc  Oxide (TRIPLE PASTE) 12.8 % ointment Apply topically 4 (four) times daily as needed (skin barrier). 56.7 g 0   No current facility-administered medications on file prior to visit.    Allergies:   Allergies  Allergen Reactions   Feldene [Piroxicam] Rash    Blistering rash     Neomycin -Polymyxin-Hc Itching   Statins Other (See Comments)    Myalgias   Latex Rash    Band Aid  Tequin [Gatifloxacin] Swelling and Anxiety   Valium [Diazepam] Other (See Comments)    Dizziness    Vibramycin  [Doxycycline ] Itching and Nausea Only    Physical Exam General: Frail extremely malnourished looking elderly Caucasian male seated, in no evident distress.  He is on home oxygen  Head: head normocephalic and atraumatic.   Neck: supple with no carotid or supraclavicular bruits Cardiovascular: irregular rate and rhythm, no murmurs Musculoskeletal: Extremely frail.  Kyphoscoliosis.  Has right foot drop with foot brace Skin:  no rash/petichiae Vascular:  Normal pulses all extremities  Neurologic Exam Mental Status: Awake and fully alert. Disoriented to place and time. Recent and remote memory poor Attention span, concentration and fund of knowledge diminished. Mood and affect appropriate.  MMSE scored 8/30.  Functional activity questionnaire score 28 and highly dependent.  Unable to draw clock or copy intersecting pentagons.  Speech is nonfluent and hoarse with dysarthria. Cranial Nerves: Fundoscopic exam reveals sharp disc margins. Pupils equal, briskly reactive to light. Extraocular movements full without nystagmus. Visual fields show decreased blink to threat on the right compared to the left to confrontation. Hearing diminished bilaterally facial sensation intact. Face, tongue, palate moves normally and symmetrically.  Motor: Spastic right hemiparesis with grade 2/5 strength proximally in both right upper and lower extremity and significant weakness in the right grip and right ankle foot drop and wears an AFO.   Sensory.:  Diminished right hemibody touch pinprick sensation.   Coordination: Rapid alternating movements normal in all extremities. Finger-to-nose and heel-to-shin performed accurately bilaterally. Gait and Station: Arises from chair without difficulty. Stance is normal. Gait demonstrates normal stride length and balance . Able to heel, toe and tandem walk without  difficulty.  Reflexes: 2+ and asymmetric and brisker on right. Toes downgoing.   NIHSS  15 Modified Rankin  4    12/06/2023   10:56 AM  MMSE - Mini Mental State Exam  Orientation to time 0  Orientation to Place 2  Registration 3  Attention/ Calculation 0  Recall 0  Language- name 2 objects 2  Language- repeat 0  Language- follow 3 step command 0  Language- follow 3 step command-comments unable d/t right hand paralysis  Language- read & follow direction 1  Write a sentence 0  Write a sentence-comments unable d/t dominant right hand paralysis  Copy design 0  Copy design-comments unable d/t dominant right hand paralysis  Total score 8     ASSESSMENT: 89 year old Caucasian male remote history of hemorrhagic left thalamic infarct in 2020 with residual spastic right hemiparesis with recent by cerebral embolic infarcts in June 2025 with no significant vascular dementia worsening of right hemiplegia.     PLAN:I had a long d/w patient and his daughter and son-in-law about his recent embolic strokes, chronic atrial fibrillation, history of thalamic hemorrhage and spastic right hemiplegia new cognitive issues and vascular dementia, risk for recurrent stroke/TIAs, personally independently reviewed imaging studies and stroke evaluation results and answered questions.Continue Eliquis  low-dose 2.5 twice daily given remote history of intracerebral hemorrhage for secondary stroke prevention for his A-fib and maintain strict control of hypertension with blood pressure goal below 130/90, diabetes with hemoglobin A1c goal below 6.5% and lipids with LDL cholesterol goal below 70 mg/dL. I also advised the patient to eat a healthy diet with plenty of whole grains, cereals, fruits and vegetables, exercise regularly and maintain ideal body weight .patient is having cognitive worsening since his strokes and so we will check lab work for reversible causes of memory loss.  Patient unfortunately is not a candidate  for Namenda or Aricept at this time due to his severe weight loss and poor appetite and overall very poor general medical condition he may not be able to tolerate the side effects..  Continue home physical Occupational Therapy.  Recommend melatonin 5 mg at bedtime.  Regulate sleep-wake cycle and follow-up with primary care physician for management of agitation and medical issues.  Followup in the future with me only as needed and a scheduled appointment was made.   I personally spent a total of 60 minutes in the care of the patient today including getting/reviewing separately obtained history, performing a medically appropriate exam/evaluation, counseling and educating, placing orders, referring and communicating with other health care professionals, documenting clinical information in the EHR, independently interpreting results, and coordinating care.        Eather Popp, MD  Note: This document was prepared with digital dictation and possible smart phrase technology. Any transcriptional errors that result from this process are unintentional.

## 2023-12-07 ENCOUNTER — Telehealth: Payer: Self-pay

## 2023-12-07 ENCOUNTER — Telehealth: Payer: Self-pay | Admitting: Family Medicine

## 2023-12-07 DIAGNOSIS — I69151 Hemiplegia and hemiparesis following nontraumatic intracerebral hemorrhage affecting right dominant side: Secondary | ICD-10-CM | POA: Diagnosis not present

## 2023-12-07 DIAGNOSIS — J439 Emphysema, unspecified: Secondary | ICD-10-CM | POA: Diagnosis not present

## 2023-12-07 DIAGNOSIS — I5033 Acute on chronic diastolic (congestive) heart failure: Secondary | ICD-10-CM | POA: Diagnosis not present

## 2023-12-07 DIAGNOSIS — N1832 Chronic kidney disease, stage 3b: Secondary | ICD-10-CM | POA: Diagnosis not present

## 2023-12-07 DIAGNOSIS — J690 Pneumonitis due to inhalation of food and vomit: Secondary | ICD-10-CM | POA: Diagnosis not present

## 2023-12-07 DIAGNOSIS — J9611 Chronic respiratory failure with hypoxia: Secondary | ICD-10-CM | POA: Diagnosis not present

## 2023-12-07 DIAGNOSIS — I69120 Aphasia following nontraumatic intracerebral hemorrhage: Secondary | ICD-10-CM | POA: Diagnosis not present

## 2023-12-07 DIAGNOSIS — I13 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 failure and stage 1 through stage 4 chronic kidney disease, or unspecified chronic kidney disease: Secondary | ICD-10-CM | POA: Diagnosis not present

## 2023-12-07 DIAGNOSIS — I5022 Chronic systolic (congestive) heart failure: Secondary | ICD-10-CM | POA: Diagnosis not present

## 2023-12-07 NOTE — Telephone Encounter (Unsigned)
 Copied from CRM #[id number]. Topic: Clinical - Home Health Verbal Orders >> Dec 07, 2023  4:16 PM Donee H wrote: Caller/Agency: Mel PT with [facility] Callback Number: [phone] Service Requested: Skilled Nursing Frequency: none was given stated requesting home health nurse to evaluate patient's pressure and monitor lung condition Any new concerns about the patient? No

## 2023-12-08 ENCOUNTER — Other Ambulatory Visit (HOSPITAL_COMMUNITY): Payer: Self-pay

## 2023-12-08 ENCOUNTER — Telehealth: Payer: Self-pay

## 2023-12-08 DIAGNOSIS — Z79899 Other long term (current) drug therapy: Secondary | ICD-10-CM | POA: Insufficient documentation

## 2023-12-08 LAB — DEMENTIA PANEL
Homocysteine: 10.1 umol/L (ref 0.0–21.3)
RPR Ser Ql: NONREACTIVE
TSH: 2.99 u[IU]/mL (ref 0.450–4.500)
Vitamin B-12: 770 pg/mL (ref 232–1245)

## 2023-12-08 NOTE — Telephone Encounter (Signed)
 Fax received from [employer] needing an updated order for gel overlay mattress and an addend note. Pt's last visit was 10/31/23. Fax and order in green folder. Thank you.

## 2023-12-10 ENCOUNTER — Ambulatory Visit: Payer: Self-pay | Admitting: Neurology

## 2023-12-12 DIAGNOSIS — I5033 Acute on chronic diastolic (congestive) heart failure: Secondary | ICD-10-CM | POA: Diagnosis not present

## 2023-12-12 DIAGNOSIS — J690 Pneumonitis due to inhalation of food and vomit: Secondary | ICD-10-CM | POA: Diagnosis not present

## 2023-12-12 DIAGNOSIS — I5022 Chronic systolic (congestive) heart failure: Secondary | ICD-10-CM | POA: Diagnosis not present

## 2023-12-12 DIAGNOSIS — J9611 Chronic respiratory failure with hypoxia: Secondary | ICD-10-CM | POA: Diagnosis not present

## 2023-12-12 DIAGNOSIS — I69120 Aphasia following nontraumatic intracerebral hemorrhage: Secondary | ICD-10-CM | POA: Diagnosis not present

## 2023-12-12 DIAGNOSIS — J439 Emphysema, unspecified: Secondary | ICD-10-CM | POA: Diagnosis not present

## 2023-12-12 DIAGNOSIS — N1832 Chronic kidney disease, stage 3b: Secondary | ICD-10-CM | POA: Diagnosis not present

## 2023-12-12 DIAGNOSIS — I13 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 failure and stage 1 through stage 4 chronic kidney disease, or unspecified chronic kidney disease: Secondary | ICD-10-CM | POA: Diagnosis not present

## 2023-12-12 DIAGNOSIS — I69151 Hemiplegia and hemiparesis following nontraumatic intracerebral hemorrhage affecting right dominant side: Secondary | ICD-10-CM | POA: Diagnosis not present

## 2023-12-13 ENCOUNTER — Telehealth: Payer: Self-pay

## 2023-12-13 NOTE — Telephone Encounter (Signed)
 Fax received from [employer] needed measurements of wounds to cover gel overlay mattress. Call placed with [facility] and LM for pt's office to discuss documentation for measurements. Mjp,lpn

## 2023-12-15 DIAGNOSIS — N139 Obstructive and reflux uropathy, unspecified: Secondary | ICD-10-CM | POA: Diagnosis not present

## 2023-12-15 DIAGNOSIS — R3914 Feeling of incomplete bladder emptying: Secondary | ICD-10-CM | POA: Diagnosis not present

## 2023-12-19 DIAGNOSIS — I5033 Acute on chronic diastolic (congestive) heart failure: Secondary | ICD-10-CM | POA: Diagnosis not present

## 2023-12-19 DIAGNOSIS — J690 Pneumonitis due to inhalation of food and vomit: Secondary | ICD-10-CM | POA: Diagnosis not present

## 2023-12-19 DIAGNOSIS — I69120 Aphasia following nontraumatic intracerebral hemorrhage: Secondary | ICD-10-CM | POA: Diagnosis not present

## 2023-12-19 DIAGNOSIS — I5022 Chronic systolic (congestive) heart failure: Secondary | ICD-10-CM | POA: Diagnosis not present

## 2023-12-19 DIAGNOSIS — J439 Emphysema, unspecified: Secondary | ICD-10-CM | POA: Diagnosis not present

## 2023-12-19 DIAGNOSIS — J9611 Chronic respiratory failure with hypoxia: Secondary | ICD-10-CM | POA: Diagnosis not present

## 2023-12-19 DIAGNOSIS — I13 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 failure and stage 1 through stage 4 chronic kidney disease, or unspecified chronic kidney disease: Secondary | ICD-10-CM | POA: Diagnosis not present

## 2023-12-19 DIAGNOSIS — I69151 Hemiplegia and hemiparesis following nontraumatic intracerebral hemorrhage affecting right dominant side: Secondary | ICD-10-CM | POA: Diagnosis not present

## 2023-12-19 DIAGNOSIS — N1832 Chronic kidney disease, stage 3b: Secondary | ICD-10-CM | POA: Diagnosis not present

## 2023-12-22 ENCOUNTER — Other Ambulatory Visit (HOSPITAL_COMMUNITY): Payer: Self-pay

## 2023-12-26 ENCOUNTER — Other Ambulatory Visit: Payer: Self-pay | Admitting: Licensed Clinical Social Worker

## 2023-12-26 DIAGNOSIS — R3914 Feeling of incomplete bladder emptying: Secondary | ICD-10-CM | POA: Diagnosis not present

## 2023-12-26 DIAGNOSIS — N139 Obstructive and reflux uropathy, unspecified: Secondary | ICD-10-CM | POA: Diagnosis not present

## 2023-12-26 DIAGNOSIS — Q541 Hypospadias, penile: Secondary | ICD-10-CM | POA: Diagnosis not present

## 2023-12-27 DIAGNOSIS — I5022 Chronic systolic (congestive) heart failure: Secondary | ICD-10-CM | POA: Diagnosis not present

## 2023-12-27 DIAGNOSIS — J439 Emphysema, unspecified: Secondary | ICD-10-CM | POA: Diagnosis not present

## 2023-12-27 DIAGNOSIS — L89322 Pressure ulcer of left buttock, stage 2: Secondary | ICD-10-CM | POA: Diagnosis not present

## 2023-12-27 DIAGNOSIS — I69120 Aphasia following nontraumatic intracerebral hemorrhage: Secondary | ICD-10-CM | POA: Diagnosis not present

## 2023-12-27 DIAGNOSIS — I13 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 failure and stage 1 through stage 4 chronic kidney disease, or unspecified chronic kidney disease: Secondary | ICD-10-CM | POA: Diagnosis not present

## 2023-12-27 DIAGNOSIS — I5033 Acute on chronic diastolic (congestive) heart failure: Secondary | ICD-10-CM | POA: Diagnosis not present

## 2023-12-27 DIAGNOSIS — J9611 Chronic respiratory failure with hypoxia: Secondary | ICD-10-CM | POA: Diagnosis not present

## 2023-12-27 DIAGNOSIS — L89152 Pressure ulcer of sacral region, stage 2: Secondary | ICD-10-CM | POA: Diagnosis not present

## 2023-12-27 DIAGNOSIS — I69151 Hemiplegia and hemiparesis following nontraumatic intracerebral hemorrhage affecting right dominant side: Secondary | ICD-10-CM | POA: Diagnosis not present

## 2023-12-28 ENCOUNTER — Ambulatory Visit: Payer: Self-pay

## 2023-12-28 DIAGNOSIS — L89322 Pressure ulcer of left buttock, stage 2: Secondary | ICD-10-CM | POA: Diagnosis not present

## 2023-12-28 DIAGNOSIS — J439 Emphysema, unspecified: Secondary | ICD-10-CM | POA: Diagnosis not present

## 2023-12-28 DIAGNOSIS — I69120 Aphasia following nontraumatic intracerebral hemorrhage: Secondary | ICD-10-CM | POA: Diagnosis not present

## 2023-12-28 DIAGNOSIS — I69151 Hemiplegia and hemiparesis following nontraumatic intracerebral hemorrhage affecting right dominant side: Secondary | ICD-10-CM | POA: Diagnosis not present

## 2023-12-28 DIAGNOSIS — J9611 Chronic respiratory failure with hypoxia: Secondary | ICD-10-CM | POA: Diagnosis not present

## 2023-12-28 DIAGNOSIS — I5022 Chronic systolic (congestive) heart failure: Secondary | ICD-10-CM | POA: Diagnosis not present

## 2023-12-28 DIAGNOSIS — I13 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 failure and stage 1 through stage 4 chronic kidney disease, or unspecified chronic kidney disease: Secondary | ICD-10-CM | POA: Diagnosis not present

## 2023-12-28 DIAGNOSIS — I5033 Acute on chronic diastolic (congestive) heart failure: Secondary | ICD-10-CM | POA: Diagnosis not present

## 2023-12-28 DIAGNOSIS — L89152 Pressure ulcer of sacral region, stage 2: Secondary | ICD-10-CM | POA: Diagnosis not present

## 2023-12-28 NOTE — Telephone Encounter (Signed)
    FYI Only or Action Required?: Action required by provider: would like to have a nebulizer ordered for him due to increased difficulty expelling sputum with inhaler.  States O2 was 91%. This message is from [place] from [place].   Patient was last seen in primary care on 10/31/2023 by [name], MD.  Called Nurse Triage reporting No chief complaint on file..  Symptoms began today.  Interventions attempted: Nothing.  Symptoms are: unchanged.  Triage Disposition: No disposition on file.  Patient/caregiver understands and will follow disposition?:     Message from Rea ORN sent at 12/28/2023 11:08 AM EDT  Reason for CRM: Angeolina with [facility] called to advise the pt oxygen  concentration is 90%. She would like to have nurse or CMA call back regarding pt getting an inhaler or nebulizer.  Call Back # [phone]

## 2023-12-29 ENCOUNTER — Other Ambulatory Visit (HOSPITAL_COMMUNITY): Payer: Self-pay

## 2023-12-29 ENCOUNTER — Other Ambulatory Visit: Payer: Self-pay | Admitting: Family Medicine

## 2023-12-29 ENCOUNTER — Other Ambulatory Visit: Payer: Self-pay

## 2023-12-29 DIAGNOSIS — R051 Acute cough: Secondary | ICD-10-CM

## 2023-12-29 MED ORDER — AMOXICILLIN-POT CLAVULANATE 875-125 MG PO TABS
1.0000 | ORAL_TABLET | Freq: Two times a day (BID) | ORAL | 0 refills | Status: DC
  Filled 2023-12-29: qty 20, 10d supply, fill #0

## 2023-12-29 MED ORDER — AMOXICILLIN-POT CLAVULANATE 875-125 MG PO TABS: 1.0000 | ORAL_TABLET | Freq: Two times a day (BID) | ORAL | 0 refills | Status: DC

## 2024-01-01 ENCOUNTER — Telehealth: Payer: Self-pay

## 2024-01-01 ENCOUNTER — Other Ambulatory Visit (HOSPITAL_COMMUNITY): Payer: Self-pay

## 2024-01-01 ENCOUNTER — Telehealth (HOSPITAL_COMMUNITY): Payer: Self-pay

## 2024-01-01 DIAGNOSIS — R339 Retention of urine, unspecified: Secondary | ICD-10-CM

## 2024-01-01 NOTE — Patient Outreach (Signed)
 Complex Care Management   Visit Note  12/26/2023  Name:  Evan [name] MRN: [id number] DOB: 03-06-1928  Situation: Referral received for Complex Care Management related to Caregiver Stress I obtained verbal consent from Caregiver.  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   Altered mental status 08/21/2018   Anginal chest pain at rest    Chronic non, controlled on Lorazepam    Anxiety    Arthritis    all over    BPH (benign prostatic hyperplasia)    CAD (coronary artery disease)    Cellulitis of arm, right 06/01/2018   Cellulitis of left axilla    Chronic lower back pain    Complication of anesthesia    problems making water afterwards   Depression    DJD (degenerative joint disease)    Esophageal ulcer without bleeding    bid ppi indefinitely   Fall 07/16/2018   Family history of adverse reaction to anesthesia    children w/PONV   GERD (gastroesophageal reflux disease)    Haemophilus influenzae septicemia (HCC) 03/12/2023   Headache    couple/week maybe (09/04/2015)   Hemochromatosis    Possible, elevated iron  stores, hook-like osteophytes on hand films, normal LFTs   Hepatitis    yellow jaundice as a baby   History of ASCVD    MULTIVESSEL   Hyperlipidemia    Hypertension    Hypotension    Mild aortic stenosis 06/23/2021   Osteoarthritis    Peripheral vascular disease    Pneumonia    several times; got a little now (09/04/2015)   Pulmonary embolism (HCC) 02/02/2018   Rash and nonspecific skin eruption 06/01/2018   Rheumatoid arthritis (HCC)    RSV (respiratory syncytial virus pneumonia) 05/15/2022   Stroke (HCC)    Subdural hematoma (HCC)    small after fall 09/2016-plavix   held, neurosurgery consulted.  Asymptomatic.     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 PO Eliquis     Assessment: Patient Reported Symptoms:  Cognitive Cognitive Status: Able to follow simple commands, Normal speech and language skills Cognitive/Intellectual Conditions Management [RPT]: Other Other: Dementia   Health Maintenance Behaviors: Annual physical exam  Neurological Neurological Review of Symptoms: Not assessed    HEENT HEENT Symptoms Reported: Not assessed      Cardiovascular Cardiovascular Symptoms Reported: Not assessed    Respiratory Respiratory Symptoms Reported: Not assesed    Endocrine Endocrine Symptoms Reported: Not assessed    Gastrointestinal Gastrointestinal Symptoms Reported: Not assessed      Genitourinary Genitourinary Symptoms Reported: Other Other Genitourinary Symptoms: Caregiver reports skin on penis is splitting from catheter. LCSW encouraged caregiver to contact [facility] to see if they can move up appt scheduled on 10/9 at 3 PM Genitourinary Management Strategies: Medical device  Integumentary Integumentary Symptoms Reported: Not assessed    Musculoskeletal Musculoskelatal Symptoms Reviewed: Limited mobility        Psychosocial Psychosocial Symptoms Reported: No symptoms reported          01/01/2024    PHQ2-9 Depression Screening   Little interest or pleasure in doing things    Feeling down, depressed, or hopeless    PHQ-2 - Total Score    Trouble falling or staying asleep, or sleeping  too much    Feeling tired or having little energy    Poor appetite or overeating     Feeling bad about yourself - or that you are a failure or have let yourself or your family down    Trouble concentrating on things, such as reading the newspaper or watching television    Moving or speaking so slowly that other people could have noticed.  Or the opposite - being so fidgety or restless that you have been moving around a  lot more than usual    Thoughts that you would be better off dead, or hurting yourself in some way    PHQ2-9 Total Score    If you checked off any problems, how difficult have these problems made it for you to do your work, take care of things at home, or get along with other people    Depression Interventions/Treatment      There were no vitals filed for this visit.  Medications Reviewed Today     Reviewed by Ezzard Rolin BIRCH, LCSW (Social Worker) on 01/01/24 at 1246  Med List Status: <None>   Medication Order Taking? Sig Documenting Provider Last Dose Status Informant  acetaminophen  (TYLENOL ) 325 MG tablet [id number]  Take 325-650 mg by mouth 2 (two) times daily as needed for mild pain (pain score 1-3), moderate pain (pain score 4-6), fever or headache. [provider]  Active Child, Pharmacy Records  amoxicillin -clavulanate (AUGMENTIN ) 875-125 MG tablet [id number]  Take 1 tablet by mouth 2 (two) times daily. [name], MD  Active   apixaban  (ELIQUIS ) 2.5 MG TABS tablet [id number]  TAKE 1 TABLET TWICE DAILY Varanasi, Jayadeep S, MD  Active   budesonide -formoterol  (SYMBICORT ) 160-4.5 MCG/ACT inhaler [id number]  Inhale 2 puffs into the lungs in the morning and at bedtime. [name], MD  Active Child, Pharmacy Records  colchicine  0.6 MG tablet [id number]  Take 1 tablet (0.6 mg total) by mouth daily. [name], MD  Active   FEROSUL [phone] Fe) MG tablet [id number]  TAKE 1 TABLET EVERY DAY [name], MD  Active   FLUoxetine  (PROZAC ) 20 MG capsule [id number]  TAKE 1 CAPSULE EVERY DAY [name], MD  Active Child, Pharmacy Records  folic acid  (FOLVITE ) 1 MG tablet [id number]  Take 1 tablet (1 mg total) by mouth daily. [name], MD  Active   furosemide  (LASIX ) 40 MG tablet [id number]  TAKE 1 TABLET EVERY DAY [name], MD  Active Child, Pharmacy Records  hydrOXYzine  (ATARAX ) 10 MG/5ML syrup [id number]  Take 5 mLs (10 mg total) by mouth every 8  (eight) hours as needed for itching. [name], [name], MD  Active Child, Pharmacy Records  melatonin 3 MG TABS tablet [id number]  Take 1 tablet (3 mg total) by mouth at bedtime as needed (insomnia).  Patient taking differently: Take 3 mg by mouth at bedtime.   [name], MD  Active   metoprolol  tartrate (LOPRESSOR ) 25 MG tablet [id number]  Take 25 mg by mouth daily.  Patient taking differently: Take 12.5 mg by mouth daily.   [provider]  Active Child, Pharmacy Records  mupirocin  ointment (BACTROBAN ) 2 % [id number]  Apply 1 Application topically 2 (two) times daily.  Patient taking differently: Apply 1 Application topically 2 (two) times daily as needed.   Hyatt, [name], DPM  Active Child, Pharmacy Records  nitroGLYCERIN  (NITROSTAT ) 0.4 MG SL tablet [id number]  Place 1 tablet (0.4 mg total) under the  tongue every 5 (five) minutes as needed for chest pain. [name], MD  Active Child, Pharmacy Records           Med Note Crosswicks, [name]   Sat Sep 09, 2023 10:35 PM)    nystatin  (MYCOSTATIN /NYSTOP ) powder [id number]  Apply a thin layer topically to the affected area 2-3 (two to three) times daily.  Patient taking differently: Apply 1 Application topically 2 (two) times daily as needed.     Active Child, Pharmacy Records  OXYGEN  [id number]  Inhale 2 L/min into the lungs as needed (Shortness of breath). [provider]  Active   pantoprazole  (PROTONIX ) 40 MG tablet [id number]  TAKE 1 TABLET EVERY MORNING Pickard, Butler DASEN, MD  Active Child, Pharmacy Records  potassium chloride  SA (KLOR-CON  M) 20 MEQ tablet [id number]  Take 1 tablet (20 mEq total) by mouth daily. [name], MD  Active   rosuvastatin  (CRESTOR ) 5 MG tablet [id number]  Take 1 tablet (5 mg total) by mouth at bedtime. [name], [name], MD  Active Child, Pharmacy Records  senna-docusate (STIMULANT LAXATIVE) 8.6-50 MG tablet [id number]  Take 2 tablets by mouth 2 (two) times daily as needed for  constipation   Active Child, Pharmacy Records  tamsulosin  (FLOMAX ) 0.4 MG CAPS capsule [id number]  TAKE 1 CAPSULE EVERY DAY WITH SUPPER [name], MD  Active   Zinc  Oxide (TRIPLE PASTE) 12.8 % ointment [id number]  Apply topically 4 (four) times daily as needed (skin barrier). [name]., MD  Active Child, Pharmacy Records            Recommendation:   Specialty provider follow-up [facility] scheduled for 10/09 at 3 PM Continue Current Plan of Care  Follow Up Plan:   Patient has met all care management goals. Care Management case will be closed. Patient has been provided contact information should new needs arise.   Rolin Ezzard HUGHS [facility]  [facility], [facility] Clinical Social Worker Direct Dial: [phone]  Fax: [phone] Website: [email or web address] 12:54 PM

## 2024-01-01 NOTE — Patient Instructions (Addendum)
 Norleen KANDICE Daring -  I am sorry we were unable to complete our scheduled telephone appointment today. I work with [name], MD and am calling to support your healthcare needs. Please note that I have rescheduled our nurse telephone call. I will call you on Monday, October 20, at 1:30 PM. I look forward to speaking with you and hope things are going well.   Thank you,   Clayborne Ly RN BSN CCM [place]  [facility], [facility] Nurse Care Coordinator  Direct Dial: [phone] Website: [email or web address]

## 2024-01-01 NOTE — Telephone Encounter (Signed)
 Called to schedule sp tube placement, no answer, left vm. AB

## 2024-01-01 NOTE — Patient Instructions (Signed)
 Visit Information  Thank you for taking time to visit with me today. Please don't hesitate to contact me if I can be of assistance to you before our next scheduled appointment.  Patient has met all care management goals. Care Management case will be closed. Patient has been provided contact information should new needs arise.   Please call the care guide team at [phone] if you need to cancel, schedule, or reschedule an appointment.   Please call the Suicide and Crisis Lifeline: 988 go to [facility] [address], [place] [phone]) call 911 if you are experiencing a Mental Health or Behavioral Health Crisis or need someone to talk to.  Rolin Kerns, LCSW [place]  [facility], [facility] Clinical Social Worker Direct Dial: [phone]  Fax: [phone] Website: [email or web address] 12:54 PM

## 2024-01-02 ENCOUNTER — Telehealth: Payer: Self-pay

## 2024-01-02 DIAGNOSIS — L89152 Pressure ulcer of sacral region, stage 2: Secondary | ICD-10-CM | POA: Diagnosis not present

## 2024-01-02 DIAGNOSIS — J439 Emphysema, unspecified: Secondary | ICD-10-CM | POA: Diagnosis not present

## 2024-01-02 DIAGNOSIS — I69120 Aphasia following nontraumatic intracerebral hemorrhage: Secondary | ICD-10-CM | POA: Diagnosis not present

## 2024-01-02 DIAGNOSIS — L89322 Pressure ulcer of left buttock, stage 2: Secondary | ICD-10-CM | POA: Diagnosis not present

## 2024-01-02 DIAGNOSIS — I13 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 failure and stage 1 through stage 4 chronic kidney disease, or unspecified chronic kidney disease: Secondary | ICD-10-CM | POA: Diagnosis not present

## 2024-01-02 DIAGNOSIS — J9611 Chronic respiratory failure with hypoxia: Secondary | ICD-10-CM | POA: Diagnosis not present

## 2024-01-02 DIAGNOSIS — I69151 Hemiplegia and hemiparesis following nontraumatic intracerebral hemorrhage affecting right dominant side: Secondary | ICD-10-CM | POA: Diagnosis not present

## 2024-01-02 DIAGNOSIS — I5033 Acute on chronic diastolic (congestive) heart failure: Secondary | ICD-10-CM | POA: Diagnosis not present

## 2024-01-02 DIAGNOSIS — I5022 Chronic systolic (congestive) heart failure: Secondary | ICD-10-CM | POA: Diagnosis not present

## 2024-01-02 NOTE — Telephone Encounter (Signed)
 Copied from CRM #[id number]. Topic: Clinical - Order For Equipment >> Jan 02, 2024  2:09 PM Rea ORN wrote: Reason for CRM: Pt daughter Evan [name] returned called to advise that they need a Standard Mattress for pt. The wrong mattress was ordered and [employer] advised for clinic to request Standard Mattress only.  Please call Evan [name] once completed. She apologized for the back and forth but is frustrated that they have been trying to get this taken care of since 9/8.

## 2024-01-02 NOTE — Telephone Encounter (Signed)
 New order needed for mattress. Original order stated gel overlay, which is incorrect. Mjp,lpn

## 2024-01-03 ENCOUNTER — Telehealth: Payer: Self-pay

## 2024-01-03 NOTE — Telephone Encounter (Signed)
   Pre-operative Risk Assessment    Patient Name: Evan [name]  DOB: 30-Dec-1927 MRN: [id number]   Date of last office visit: 07/11/23 THOM SLUDER, PA-C Date of next office visit: NONE   Request for Surgical Clearance    Procedure:  SUPRAPUBIC CATHETER PLACEMENT  Date of Surgery:  Clearance 01/16/24                                Surgeon:  DR CORDELLA BANNER Surgeon's Group or Practice Name:  [facility] RADIOLOGY DEPARTMENT Phone number:  [phone] Fax number:  [phone]   Type of Clearance Requested:   - Medical  - Pharmacy:  Hold Apixaban  (Eliquis ) 2 DAYS PRIOR   Type of Anesthesia:  MODERATE SEDATION   Additional requests/questions:    Signed, Lucie DELENA Ku   01/03/2024, 11:51 AM

## 2024-01-04 NOTE — Telephone Encounter (Signed)
 I left a message for [facility] Radiology dept that I will fax notes from Dr. Ginette question to the urgency and recommendations.   We will need for scheduler to call back if urgent as Dr. Sheena will need to review or if the procedure can wait until after 6 months from CVA.   I will fax these notes to [facility] Radiology to review the notes.

## 2024-01-04 NOTE — Telephone Encounter (Signed)
 Patient with diagnosis of A Fib + PE on Eliquis  for anticoagulation. CVA on 09/26/23.  Procedure: SUPRAPUBIC CATHETER PLACEMENT  Date of procedure: 01/16/24   CHA2DS2-VASc Score = 7  This indicates a 11.2% annual risk of stroke. The patient's score is based upon: CHF History: 1 HTN History: 1 Diabetes History: 0 Stroke History: 2 Vascular Disease History: 1 Age Score: 2 Gender Score: 0   CrCl 34 ml/min Platelet count 71K   Patient has not had an Afib/aflutter ablation or Watchman within the last 3 months or DCCV within the last 30 days   Patient high risk off anticoagulation. Recent CVA ~ 3 months ago. Does not have current cardiologist.  **This guidance is not considered finalized until pre-operative APP has relayed final recommendations.**

## 2024-01-04 NOTE — Telephone Encounter (Signed)
 Patient was not re-assigned to a new cardiologist at last visit. Will need to ask DOD at [facility] today to weigh in. I believe it would be Dr. Sheena today.

## 2024-01-08 ENCOUNTER — Other Ambulatory Visit: Payer: Self-pay | Admitting: Family Medicine

## 2024-01-08 ENCOUNTER — Other Ambulatory Visit (HOSPITAL_COMMUNITY): Payer: Self-pay

## 2024-01-08 ENCOUNTER — Ambulatory Visit: Admitting: Podiatrist

## 2024-01-08 DIAGNOSIS — M79675 Pain in left toe(s): Secondary | ICD-10-CM

## 2024-01-08 DIAGNOSIS — M79674 Pain in right toe(s): Secondary | ICD-10-CM | POA: Diagnosis not present

## 2024-01-08 DIAGNOSIS — B351 Tinea unguium: Secondary | ICD-10-CM

## 2024-01-08 NOTE — Telephone Encounter (Signed)
 2nd attempt to reach the radiology dept in regard to urgency, see previous notes.

## 2024-01-09 NOTE — Telephone Encounter (Signed)
 3rd attempt to reach the radiology or IR dept. I left a message for Delon Amour that I am faxing the notes over to please review the notes from Dr. Sheena as to the urgency, please see notes in regard to CVA as well.

## 2024-01-09 NOTE — Telephone Encounter (Signed)
   Name: BROUGHTON EPPINGER  DOB: 26-Sep-1927  MRN: [id number]  Primary Cardiologist: Candyce Reek, MD  Chart reviewed as part of pre-operative protocol coverage. Because of Gaetano Romberger Hoskie's past medical history and time since last visit, he will require a follow-up in-office visit in order to better assess preoperative cardiovascular risk.  Patient was seen by Thom Sluder, PA-C in April. He has not established with a new primary cardiologist. Since his procedure is delayed until December, I think he would be best served being seen for in office visit. Per Sheng's office visit note, patient was to establish care with Dr. Kate. Please schedule next available visit with Dr. Kate before December.   Pre-op covering staff: - Please schedule appointment and call patient to inform them. If patient already had an upcoming appointment within acceptable timeframe, please add pre-op clearance to the appointment notes so provider is aware. - Please contact requesting surgeon's office via preferred method (i.e, phone, fax) to inform them of need for appointment prior to surgery.  This message will also be routed to pharmacy pool a for input on holding Eliquis  as requested below so that this information is available to the clearing provider at time of patient's appointment.   Barnie Hila, NP  01/09/2024, 2:00 PM

## 2024-01-09 NOTE — Telephone Encounter (Signed)
 Patient was scheduled for suprapubic cath placement on 10/14. Appt had to be cancelled due to patient not being able to hole his Eliquis  x2 days until at least late December (6 months after his last stroke). I called pt's daughter and cancelled his appt and she is in agreement with this plan of care. Also, notified cardiology that appt has been cancelled. JM

## 2024-01-11 DIAGNOSIS — I69151 Hemiplegia and hemiparesis following nontraumatic intracerebral hemorrhage affecting right dominant side: Secondary | ICD-10-CM | POA: Diagnosis not present

## 2024-01-11 DIAGNOSIS — I13 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 failure and stage 1 through stage 4 chronic kidney disease, or unspecified chronic kidney disease: Secondary | ICD-10-CM | POA: Diagnosis not present

## 2024-01-11 DIAGNOSIS — I5022 Chronic systolic (congestive) heart failure: Secondary | ICD-10-CM | POA: Diagnosis not present

## 2024-01-11 DIAGNOSIS — L89322 Pressure ulcer of left buttock, stage 2: Secondary | ICD-10-CM | POA: Diagnosis not present

## 2024-01-11 DIAGNOSIS — I5033 Acute on chronic diastolic (congestive) heart failure: Secondary | ICD-10-CM | POA: Diagnosis not present

## 2024-01-11 DIAGNOSIS — J439 Emphysema, unspecified: Secondary | ICD-10-CM | POA: Diagnosis not present

## 2024-01-13 NOTE — Progress Notes (Signed)
 Subjective: Evan [name] is a 89 y.o. male patient who presents to office today with concern of long,mildly painful nails  while ambulating in shoes; unable to trim.  Patient denies any new cramping, numbness, burning or tingling in the legs or feet.  PCP:  [name], MD  Patient Active Problem List   Diagnosis Date Noted   High risk medications (not anticoagulants) long-term use 12/08/2023   Pain and swelling of right wrist 10/19/2023   CVA (cerebral vascular accident) (HCC) 09/22/2023   Sepsis (HCC) 09/10/2023   Aspiration pneumonia (HCC) 09/09/2023   Hypovolemia 08/24/2023   Cellulitis of upper extremity 08/23/2023   Bilateral pneumonia 08/17/2023   Acute hypoxemic respiratory failure (HCC) 08/13/2023   Chronic systolic heart failure (HCC) 08/10/2023   Acute metabolic acidosis 03/13/2023   Chronic indwelling Foley catheter 03/12/2023   Anemia 09/27/2022   Gastro-esophageal reflux disease without esophagitis 05/17/2022   Hyperlipidemia, unspecified 05/17/2022   Personal history of other venous thrombosis and embolism 05/17/2022   CKD stage 3b, GFR 30-44 ml/min (HCC) - baseline SCr 1.2-1.5 05/17/2022   Presence of coronary angioplasty implant and graft 05/17/2022   Malignant neoplasm of unspecified part of right bronchus or lung (HCC) 05/17/2022   Dementia without behavioral disturbance (HCC) 05/17/2022   Acute on chronic diastolic CHF (congestive heart failure) (HCC) 05/12/2022   Protein-calorie malnutrition, severe 05/12/2022   Chronic respiratory failure, unsp w hypoxia or hypercapnia (HCC) 04/28/2022   History of pulmonary embolism 04/28/2022   Hemiparesis due to old stroke (HCC) 06/25/2021   Mild aortic stenosis 06/23/2021   Sundowning 07/04/2019   Bilateral hearing loss 04/16/2019   Closed wedge fracture of lumbar vertebra (HCC) 03/14/2019   Pressure ulcer of back 08/27/2018   COPD (chronic obstructive pulmonary disease) (HCC) 08/26/2018   Prolonged Q-T interval  on ECG    Dysphagia, post-stroke    Hyponatremia    History of lung cancer    Depression    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   Thrombocytopenia 02/02/2018   Hypokalemia 02/02/2018   Old MI (myocardial infarction) 06/27/2017   HFmrEF (heart failure with mildly reduced ejection fraction) (HCC) 06/26/2017   Oral candidiasis 05/15/2017   Radiation pneumonitis 11/09/2016   Rheumatoid arthritis, unspecified (HCC) 10/11/2016   Subdural hematoma (HCC) 09/13/2016   Permanent atrial fibrillation (HCC) 05/21/2016   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   GAD (generalized anxiety disorder) 11/20/2015   Esophageal ulcer without bleeding    Bilateral lower extremity edema 04/28/2014   CAD (coronary artery disease) 09/09/2013   Benign prostatic hyperplasia without lower urinary tract symptoms 10/08/2012   EKG abnormalities 09/05/2012   Tremor 09/05/2012   Chronic fatigue 09/05/2012   Arthritis    Asthma, chronic    Reflux esophagitis    S/P CABG (coronary artery bypass graft)    Essential hypertension    UNSPECIFIED PERIPHERAL VASCULAR DISEASE 06/16/2009   Current Outpatient Medications on File Prior to Visit  Medication Sig Dispense Refill   acetaminophen  (TYLENOL ) 325 MG tablet Take 325-650 mg by mouth 2 (two) times daily as needed for mild pain (pain score 1-3), moderate pain (pain score 4-6), fever or headache.     amoxicillin -clavulanate (AUGMENTIN ) 875-125 MG tablet Take 1 tablet by mouth 2 (two) times daily. 20 tablet 0   apixaban  (ELIQUIS ) 2.5 MG TABS tablet TAKE 1 TABLET TWICE DAILY 180 tablet 1   budesonide -formoterol  (SYMBICORT ) 160-4.5 MCG/ACT inhaler Inhale 2 puffs into the lungs in the morning  and at bedtime. 10.2 g 12   colchicine  0.6 MG tablet Take 1 tablet (0.6 mg total) by mouth daily. 30 tablet 5   FEROSUL 325 (65 Fe) MG tablet TAKE 1 TABLET EVERY DAY 90 tablet 3   folic acid  (FOLVITE ) 1 MG tablet Take 1 tablet (1 mg  total) by mouth daily. 90 tablet 1   furosemide  (LASIX ) 40 MG tablet TAKE 1 TABLET EVERY DAY 90 tablet 3   hydrOXYzine  (ATARAX ) 10 MG/5ML syrup Take 5 mLs (10 mg total) by mouth every 8 (eight) hours as needed for itching. 30 mL 0   melatonin 3 MG TABS tablet Take 1 tablet (3 mg total) by mouth at bedtime as needed (insomnia). (Patient taking differently: Take 3 mg by mouth at bedtime.) 90 tablet 1   metoprolol  tartrate (LOPRESSOR ) 25 MG tablet Take 25 mg by mouth daily. (Patient taking differently: Take 12.5 mg by mouth daily.)     mupirocin  ointment (BACTROBAN ) 2 % Apply 1 Application topically 2 (two) times daily. (Patient taking differently: Apply 1 Application topically 2 (two) times daily as needed.) 22 g 0   nitroGLYCERIN  (NITROSTAT ) 0.4 MG SL tablet Place 1 tablet (0.4 mg total) under the tongue every 5 (five) minutes as needed for chest pain. 50 tablet 3   nystatin  (MYCOSTATIN /NYSTOP ) powder Apply a thin layer topically to the affected area 2-3 (two to three) times daily. (Patient taking differently: Apply 1 Application topically 2 (two) times daily as needed.) 15 g 0   OXYGEN  Inhale 2 L/min into the lungs as needed (Shortness of breath).     pantoprazole  (PROTONIX ) 40 MG tablet TAKE 1 TABLET EVERY MORNING 90 tablet 3   potassium chloride  SA (KLOR-CON  M) 20 MEQ tablet Take 1 tablet (20 mEq total) by mouth daily. 30 tablet 5   rosuvastatin  (CRESTOR ) 5 MG tablet Take 1 tablet (5 mg total) by mouth at bedtime.     senna-docusate (STIMULANT LAXATIVE) 8.6-50 MG tablet Take 2 tablets by mouth 2 (two) times daily as needed for constipation 60 tablet 2   tamsulosin  (FLOMAX ) 0.4 MG CAPS capsule TAKE 1 CAPSULE EVERY DAY WITH SUPPER 90 capsule 3   Zinc  Oxide (TRIPLE PASTE) 12.8 % ointment Apply topically 4 (four) times daily as needed (skin barrier). 56.7 g 0   No current facility-administered medications on file prior to visit.   Allergies  Allergen Reactions   Feldene [Piroxicam] Rash     Blistering rash     Neomycin -Polymyxin-Hc Itching   Statins Other (See Comments)    Myalgias   Latex Rash    Band Aid   Tequin [Gatifloxacin] Swelling and Anxiety   Valium [Diazepam] Other (See Comments)    Dizziness    Vibramycin  [Doxycycline ] Itching and Nausea Only     Objective: General: Patient is awake, alert, and oriented x 3 and in no acute distress.  Integument: Skin is warm, dry and supple bilateral. Nails are tender, long, thickened and  dystrophic with subungual debris, consistent with onychomycosis, 1-5 bilateral. No signs of infection. No open lesions or preulcerative lesions present bilateral. Remaining integument unremarkable.  Vasculature:  Dorsalis Pedis pulse 2/4 bilateral. Posterior Tibial pulse  1/4 bilateral.  Capillary fill time <3 sec 1-5 bilateral. Positive hair growth to the level of the digits. Temperature gradient within normal limits. No varicosities present bilateral. No edema present bilateral.   Neurology: The patient has intact sensation measured with a 5.07/10g Semmes Weinstein Monofilament at all pedal sites bilateral . Vibratory sensation diminished bilateral  with tuning fork. No Babinski sign present bilateral.   Musculoskeletal: No symptomatic pedal deformities noted bilateral. Muscular strength 5/5 in all lower extremity muscular groups bilateral without pain on range of motion . No tenderness with calf compression bilateral.  Assessment and Plan: Problem List Items Addressed This Visit   None Visit Diagnoses       Pain due to onychomycosis of toenails of both feet    -  Primary       -Examined patient. -Mechanically debrided all nails 1-5 bilateral using sterile nail nipper and filed with sterile dremel without incident  -Answered all patient questions -Patient to return  in 3 months for continued foot care.  -Patient advised to call the office if any problems or questions arise in the meantime.

## 2024-01-15 DIAGNOSIS — N139 Obstructive and reflux uropathy, unspecified: Secondary | ICD-10-CM | POA: Diagnosis not present

## 2024-01-16 ENCOUNTER — Ambulatory Visit (HOSPITAL_COMMUNITY)

## 2024-01-16 ENCOUNTER — Encounter (HOSPITAL_COMMUNITY): Payer: Self-pay

## 2024-01-16 DIAGNOSIS — J439 Emphysema, unspecified: Secondary | ICD-10-CM | POA: Diagnosis not present

## 2024-01-16 DIAGNOSIS — L89322 Pressure ulcer of left buttock, stage 2: Secondary | ICD-10-CM | POA: Diagnosis not present

## 2024-01-16 DIAGNOSIS — I69151 Hemiplegia and hemiparesis following nontraumatic intracerebral hemorrhage affecting right dominant side: Secondary | ICD-10-CM | POA: Diagnosis not present

## 2024-01-16 DIAGNOSIS — J9611 Chronic respiratory failure with hypoxia: Secondary | ICD-10-CM | POA: Diagnosis not present

## 2024-01-16 DIAGNOSIS — L89152 Pressure ulcer of sacral region, stage 2: Secondary | ICD-10-CM | POA: Diagnosis not present

## 2024-01-16 DIAGNOSIS — I69120 Aphasia following nontraumatic intracerebral hemorrhage: Secondary | ICD-10-CM | POA: Diagnosis not present

## 2024-01-16 DIAGNOSIS — I5022 Chronic systolic (congestive) heart failure: Secondary | ICD-10-CM | POA: Diagnosis not present

## 2024-01-16 DIAGNOSIS — I5033 Acute on chronic diastolic (congestive) heart failure: Secondary | ICD-10-CM | POA: Diagnosis not present

## 2024-01-16 DIAGNOSIS — I13 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 failure and stage 1 through stage 4 chronic kidney disease, or unspecified chronic kidney disease: Secondary | ICD-10-CM | POA: Diagnosis not present

## 2024-01-17 ENCOUNTER — Other Ambulatory Visit: Payer: Self-pay | Admitting: Family Medicine

## 2024-01-17 NOTE — Telephone Encounter (Signed)
 Copied from CRM [id number]. Topic: Clinical - Medication Refill >> Jan 17, 2024  2:27 PM Amy B wrote: Medication: hydrOXYzine  (ATARAX ) 10 MG/5ML syrup  Has the patient contacted their pharmacy? No (Agent: If no, request that the patient contact the pharmacy for the refill. If patient does not wish to contact the pharmacy document the reason why and proceed with request.) (Agent: If yes, when and what did the pharmacy advise?)  This is the patient's preferred pharmacy:   [facility] [address] [place] [address] Phone: [phone] Fax: [phone]  Is this the correct pharmacy for this prescription? Yes If no, delete pharmacy and type the correct one.   Has the prescription been filled recently? No  Is the patient out of the medication? Yes  Has the patient been seen for an appointment in the last year OR does the patient have an upcoming appointment? Yes  Can we respond through MyChart? No  Agent: Please be advised that Rx refills may take up to 3 business days. We ask that you follow-up with your pharmacy.

## 2024-01-18 DIAGNOSIS — F0394 Unspecified dementia, unspecified severity, with anxiety: Secondary | ICD-10-CM

## 2024-01-18 DIAGNOSIS — J439 Emphysema, unspecified: Secondary | ICD-10-CM | POA: Diagnosis not present

## 2024-01-18 DIAGNOSIS — I69120 Aphasia following nontraumatic intracerebral hemorrhage: Secondary | ICD-10-CM | POA: Diagnosis not present

## 2024-01-18 DIAGNOSIS — I13 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 failure and stage 1 through stage 4 chronic kidney disease, or unspecified chronic kidney disease: Secondary | ICD-10-CM | POA: Diagnosis not present

## 2024-01-18 DIAGNOSIS — I5033 Acute on chronic diastolic (congestive) heart failure: Secondary | ICD-10-CM | POA: Diagnosis not present

## 2024-01-18 DIAGNOSIS — I5022 Chronic systolic (congestive) heart failure: Secondary | ICD-10-CM | POA: Diagnosis not present

## 2024-01-18 DIAGNOSIS — I272 Pulmonary hypertension, unspecified: Secondary | ICD-10-CM

## 2024-01-18 DIAGNOSIS — N1832 Chronic kidney disease, stage 3b: Secondary | ICD-10-CM | POA: Diagnosis not present

## 2024-01-18 DIAGNOSIS — J9611 Chronic respiratory failure with hypoxia: Secondary | ICD-10-CM | POA: Diagnosis not present

## 2024-01-18 DIAGNOSIS — F0393 Unspecified dementia, unspecified severity, with mood disturbance: Secondary | ICD-10-CM

## 2024-01-18 DIAGNOSIS — I69151 Hemiplegia and hemiparesis following nontraumatic intracerebral hemorrhage affecting right dominant side: Secondary | ICD-10-CM | POA: Diagnosis not present

## 2024-01-18 DIAGNOSIS — D631 Anemia in chronic kidney disease: Secondary | ICD-10-CM | POA: Diagnosis not present

## 2024-01-19 ENCOUNTER — Other Ambulatory Visit (HOSPITAL_COMMUNITY): Payer: Self-pay

## 2024-01-19 MED ORDER — HYDROXYZINE HCL 10 MG/5ML PO SYRP
10.0000 mg | ORAL_SOLUTION | Freq: Three times a day (TID) | ORAL | 0 refills | Status: DC | PRN
  Filled 2024-01-19: qty 30, 2d supply, fill #0

## 2024-01-19 NOTE — Telephone Encounter (Signed)
 Requested medication (s) are due for refill today: yes  Requested medication (s) are on the active medication list: yes  Last refill:  08/21/23  Future visit scheduled: no  Notes to clinic:  Unable to refill per protocol, last refill by another provider.      Requested Prescriptions  Pending Prescriptions Disp Refills   hydrOXYzine  (ATARAX ) 10 MG/5ML syrup 30 mL 0    Sig: Take 5 mLs (10 mg total) by mouth every 8 (eight) hours as needed for itching.     Ear, Nose, and Throat:  Antihistamines 2 Passed - 01/19/2024 11:31 AM      Passed - Cr in normal range and within 360 days    Creatinine  Date Value Ref Range Status  08/16/2016 1.2 0.7 - 1.3 mg/dL Final   Creat  Date Value Ref Range Status  10/10/2023 1.04 0.70 - 1.22 mg/dL Final   Creatinine, Ser  Date Value Ref Range Status  11/10/2023 1.20 0.61 - 1.24 mg/dL Final         Passed - Valid encounter within last 12 months    Recent Outpatient Visits           2 months ago Pain and swelling of right wrist   [place] [facility] [name], MD   3 months ago Pain and swelling of right wrist   [place] [facility] Kayla Jeoffrey RAMAN, FNP   3 months ago Hypokalemia   [place] [facility] Duanne, Butler DASEN, MD   4 months ago Weakness generalized   [place] [facility] [name], MD   4 months ago Chronic systolic congestive heart failure [facility])   [place] [facility] Pickard, Butler DASEN, MD

## 2024-01-19 NOTE — Telephone Encounter (Signed)
 Pt's daughter called to check the status of this request, she says tell him we need it

## 2024-01-22 ENCOUNTER — Other Ambulatory Visit: Payer: Self-pay

## 2024-01-22 ENCOUNTER — Other Ambulatory Visit: Payer: Self-pay | Admitting: Family Medicine

## 2024-01-22 ENCOUNTER — Other Ambulatory Visit (HOSPITAL_COMMUNITY): Payer: Self-pay

## 2024-01-22 DIAGNOSIS — D649 Anemia, unspecified: Secondary | ICD-10-CM

## 2024-01-22 DIAGNOSIS — R5383 Other fatigue: Secondary | ICD-10-CM

## 2024-01-22 DIAGNOSIS — G4700 Insomnia, unspecified: Secondary | ICD-10-CM

## 2024-01-22 DIAGNOSIS — G8191 Hemiplegia, unspecified affecting right dominant side: Secondary | ICD-10-CM

## 2024-01-22 DIAGNOSIS — R531 Weakness: Secondary | ICD-10-CM

## 2024-01-22 DIAGNOSIS — I5022 Chronic systolic (congestive) heart failure: Secondary | ICD-10-CM

## 2024-01-22 DIAGNOSIS — I10 Essential (primary) hypertension: Secondary | ICD-10-CM

## 2024-01-22 MED ORDER — ZINC OXIDE 12.8 % EX OINT
TOPICAL_OINTMENT | Freq: Four times a day (QID) | CUTANEOUS | 11 refills | Status: DC | PRN
  Filled 2024-01-22: qty 56.7, fill #0

## 2024-01-22 MED ORDER — FOLIC ACID 1 MG PO TABS
1.0000 mg | ORAL_TABLET | Freq: Every day | ORAL | 3 refills | Status: DC
  Filled 2024-01-22: qty 90, 90d supply, fill #0

## 2024-01-22 MED ORDER — TRANSFER BENCH MISC
1.0000 | Freq: Once | 0 refills | Status: AC
  Filled 2024-01-22: qty 1, fill #0

## 2024-01-22 NOTE — Patient Outreach (Signed)
 Complex Care Management   Visit Note  01/22/2024  Name:  Evan [name] MRN: [id number] DOB: 08-Jul-1927  Situation: Referral received for Complex Care Management related to Permanent Atrial Fib, s/p CVA in June, 2025, Gout to great left toe, Indwelling Foley Catheter, stage 1 to 2 sacral pressure ulcer, Malignant neoplasm of unspecified part of right bronchus or lung, COPD, intermittent Oxygen  usage, dysphagia after stroke, Sundowning, RA. I obtained verbal consent from Patient.  Visit completed with Patient on the phone.  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   Altered mental status 08/21/2018   Anginal chest pain at rest    Chronic non, controlled on Lorazepam    Anxiety    Arthritis    all over    BPH (benign prostatic hyperplasia)    CAD (coronary artery disease)    Cellulitis of arm, right 06/01/2018   Cellulitis of left axilla    Chronic lower back pain    Complication of anesthesia    problems making water afterwards   Depression    DJD (degenerative joint disease)    Esophageal ulcer without bleeding    bid ppi indefinitely   Fall 07/16/2018   Family history of adverse reaction to anesthesia    children w/PONV   GERD (gastroesophageal reflux disease)    Haemophilus influenzae septicemia (HCC) 03/12/2023   Headache    couple/week maybe (09/04/2015)   Hemochromatosis    Possible, elevated iron  stores, hook-like osteophytes on hand films, normal LFTs   Hepatitis    yellow jaundice as a baby   History of ASCVD    MULTIVESSEL   Hyperlipidemia    Hypertension    Hypotension    Mild aortic stenosis 06/23/2021   Osteoarthritis    Peripheral vascular disease    Pneumonia    several times; got a Annaleise Burger now (09/04/2015)    Pulmonary embolism (HCC) 02/02/2018   Rash and nonspecific skin eruption 06/01/2018   Rheumatoid arthritis (HCC)    RSV (respiratory syncytial virus pneumonia) 05/15/2022   Stroke (HCC)    Subdural hematoma (HCC)    small after fall 09/2016-plavix  held, neurosurgery consulted.  Asymptomatic.     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 PO Eliquis     Assessment: Patient Reported Symptoms:  Cognitive Cognitive Status: Able to follow simple commands, Confused or disoriented, Requires Assistance Decision Making Cognitive/Intellectual Conditions Management [RPT]: Other Other: CVA in 09/2023   Health Maintenance Behaviors: Annual physical exam, Healthy diet Health Facilitated by: Rest, Healthy diet  Neurological Neurological Review of Symptoms: No symptoms reported    HEENT HEENT Symptoms Reported: No symptoms reported      Cardiovascular Cardiovascular Symptoms Reported: No symptoms reported Does patient have uncontrolled Hypertension?: No Cardiovascular Management Strategies: Adequate rest, Medication therapy, Routine screening Cardiovascular Self-Management Outcome: 4 (good)  Respiratory Respiratory Symptoms Reported: Productive cough Respiratory Management Strategies: Breathing exercise, Oxygen  therapy, Routine screening, Medication therapy Respiratory Self-Management Outcome: 4 (good)  Endocrine Endocrine Symptoms Reported: No symptoms reported Is patient diabetic?: Yes (prediabetes last A1C 5.7) Is patient checking blood sugars at home?: No    Gastrointestinal Gastrointestinal Symptoms Reported: Incontinence, Constipation Gastrointestinal Management Strategies: Incontinence garment/pad, Medication therapy, Diet modification, Nutrition support Enteral Nutrition Time Frame: Bolus Enteral Nutrition Schedule: Ensure Gastrointestinal Self-Management Outcome: 4 (good)  Gastrointestinal Comment: daughter uses thicket for liquids, and cuts food into very  small pieces, provides Ensure 1-2 x daily    Genitourinary Genitourinary Symptoms Reported: Incontinence Genitourinary Management Strategies: Incontinence garment/pad, Catheter, indwelling Genitourinary Self-Management Outcome: 4 (good)  Integumentary Integumentary Symptoms Reported: Skin changes, Wound Additional Integumentary Details: intermittent redness and itching to back; scattered bruising, occasional night sweats, scattered skin tears, stage 1 to 2 sacral pressure ulcer  Skin Management Strategies: Dressing changes, Routine screening, Medication therapy (shifting weight, using pillow for pressure relief) Skin Self-Management Outcome: 4 (good)  Musculoskeletal Musculoskelatal Symptoms Reviewed: Difficulty walking, Limited mobility, Unsteady gait Musculoskeletal Management Strategies: Adequate rest, Routine screening Musculoskeletal Self-Management Outcome: 3 (uncertain)      Psychosocial Psychosocial Symptoms Reported: Depression - if selected complete PHQ 2-9 (per daughter [name]) Additional Psychological Details: per daughter, patient states he seems depressed Behavioral Management Strategies: Support system Behavioral Health Self-Management Outcome: 4 (good)   Quality of Family Relationships: involved, helpful, supportive Do you feel physically threatened by others?: No    01/22/2024    PHQ2-9 Depression Screening   Bari Leib interest or pleasure in doing things    Feeling down, depressed, or hopeless    PHQ-2 - Total Score    Trouble falling or staying asleep, or sleeping too much    Feeling tired or having [place] energy    Poor appetite or overeating     Feeling bad about yourself - or that you are a failure or have let yourself or your family down    Trouble concentrating on things, such as reading the newspaper or watching television    Moving or speaking so slowly that other people could have noticed.  Or the opposite - being so fidgety or restless that you have been  moving around a lot more than usual    Thoughts that you would be better off dead, or hurting yourself in some way    PHQ2-9 Total Score    If you checked off any problems, how difficult have these problems made it for you to do your work, take care of things at home, or get along with other people    Depression Interventions/Treatment      There were no vitals filed for this visit.  Medications Reviewed Today     Reviewed by Morgan Clayborne CROME, RN (Registered Nurse) on 01/22/24 at 1357  Med List Status: <None>   Medication Order Taking? Sig Documenting Provider Last Dose Status Informant  acetaminophen  (TYLENOL ) 325 MG tablet [id number] Yes Take 325-650 mg by mouth 2 (two) times daily as needed for mild pain (pain score 1-3), moderate pain (pain score 4-6), fever or headache. [provider]  Active Child, Pharmacy Records  amoxicillin -clavulanate (AUGMENTIN ) 875-125 MG tablet [id number]  Take 1 tablet by mouth 2 (two) times daily. [name], MD  Consider Medication Status and Discontinue (Completed Course)   apixaban  (ELIQUIS ) 2.5 MG TABS tablet [id number] Yes TAKE 1 TABLET TWICE DAILY Varanasi, Jayadeep S, MD  Active   budesonide -formoterol  (SYMBICORT ) 160-4.5 MCG/ACT inhaler [id number] Yes Inhale 2 puffs into the lungs in the morning and at bedtime. [name], MD  Active Child, Pharmacy Records  colchicine  0.6 MG tablet [id number] Yes Take 1 tablet (0.6 mg total) by mouth daily. [name], MD  Active   FEROSUL 325 (65 Fe) MG tablet [id number] Yes TAKE 1 TABLET EVERY DAY  Patient taking differently: Take 325 mg by mouth every other day.   [name], MD  Active   FLUoxetine  (PROZAC ) 20 MG capsule [id number] Yes TAKE 1 CAPSULE EVERY DAY Pickard, Butler DASEN, MD  Active   folic acid  (FOLVITE ) 1 MG tablet [id number] Yes Take 1 tablet (1 mg total) by mouth daily. [name], MD  Active   furosemide  (LASIX ) 40 MG tablet [id number] Yes TAKE 1 TABLET EVERY DAY  [name], MD  Active Child, Pharmacy Records  hydrOXYzine  (ATARAX ) 10 MG/5ML syrup [id number]  Take 5 mLs (10 mg total) by mouth every 8 (eight) hours as needed for itching.  Patient not taking: Reported on 01/22/2024   [name], MD  Active   melatonin 3 MG TABS tablet [id number]  Take 1 tablet (3 mg total) by mouth at bedtime as needed (insomnia).  Patient not taking: Reported on 01/22/2024   [name], MD  Active   Melatonin 5 MG CAPS [id number] Yes Take 5 mg by mouth at bedtime. [provider]  Active   metoprolol  tartrate (LOPRESSOR ) 25 MG tablet [id number] Yes Take 25 mg by mouth daily.  Patient taking differently: Take 12.5 mg by mouth daily.   [provider]  Active Child, Pharmacy Records  mupirocin  ointment (BACTROBAN ) 2 % [id number]  Apply 1 Application topically 2 (two) times daily.  Patient not taking: Reported on 01/22/2024   Verta Royden DASEN, [place]  Active Child, Pharmacy Records  nitroGLYCERIN  (NITROSTAT ) 0.4 MG SL tablet [id number] Yes Place 1 tablet (0.4 mg total) under the tongue every 5 (five) minutes as needed for chest pain. [name], MD  Active Child, Pharmacy Records           Med Note Laughlin, [name]   Sat Sep 09, 2023 10:35 PM)    nystatin  (MYCOSTATIN /NYSTOP ) powder [id number]  Apply a thin layer topically to the affected area 2-3 (two to three) times daily.  Patient not taking: Reported on 01/22/2024     Active Child, Pharmacy Records  OXYGEN  [id number] Yes Inhale 2 L/min into the lungs as needed (Shortness of breath). [provider]  Active   pantoprazole  (PROTONIX ) 40 MG tablet [id number] Yes TAKE 1 TABLET EVERY MORNING Pickard, Butler DASEN, MD  Active Child, Pharmacy Records  potassium chloride  SA (KLOR-CON  M) 20 MEQ tablet [id number] Yes Take 1 tablet (20 mEq total) by mouth daily. [name], MD  Active   rosuvastatin  (CRESTOR ) 5 MG tablet [id number] Yes Take 1 tablet (5 mg total) by mouth at bedtime. [name],  [name], MD  Active Child, Pharmacy Records  senna-docusate (STIMULANT LAXATIVE) 8.6-50 MG tablet [id number] Yes Take 2 tablets by mouth 2 (two) times daily as needed for constipation   Active Child, Pharmacy Records  tamsulosin  (FLOMAX ) 0.4 MG CAPS capsule [id number] Yes TAKE 1 CAPSULE EVERY DAY WITH SUPPER [name], MD  Active   Zinc  Oxide (TRIPLE PASTE) 12.8 % ointment [id number]  Apply topically 4 (four) times daily as needed (skin barrier). [name]., MD  Consider Medication Status and Discontinue (Completed Course) Child, Pharmacy Records            Recommendation:   PCP Follow-up Specialty provider follow-up with Dr. Kate as directed  DME requests:  wheelchair tub transfer bench  Follow Up Plan:   Telephone follow up appointment date/time:  Monday, November 17 at 2:00 PM  Clayborne Ly RN BSN CCM [place]  [facility], [facility] Nurse Care Coordinator  Direct Dial: [phone] Website: [email or web address]

## 2024-01-23 DIAGNOSIS — I13 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 failure and stage 1 through stage 4 chronic kidney disease, or unspecified chronic kidney disease: Secondary | ICD-10-CM | POA: Diagnosis not present

## 2024-01-23 DIAGNOSIS — J439 Emphysema, unspecified: Secondary | ICD-10-CM | POA: Diagnosis not present

## 2024-01-23 DIAGNOSIS — I69151 Hemiplegia and hemiparesis following nontraumatic intracerebral hemorrhage affecting right dominant side: Secondary | ICD-10-CM | POA: Diagnosis not present

## 2024-01-23 DIAGNOSIS — L89322 Pressure ulcer of left buttock, stage 2: Secondary | ICD-10-CM | POA: Diagnosis not present

## 2024-01-23 DIAGNOSIS — I5033 Acute on chronic diastolic (congestive) heart failure: Secondary | ICD-10-CM | POA: Diagnosis not present

## 2024-01-23 DIAGNOSIS — J9611 Chronic respiratory failure with hypoxia: Secondary | ICD-10-CM | POA: Diagnosis not present

## 2024-01-23 DIAGNOSIS — I69120 Aphasia following nontraumatic intracerebral hemorrhage: Secondary | ICD-10-CM | POA: Diagnosis not present

## 2024-01-23 DIAGNOSIS — L89152 Pressure ulcer of sacral region, stage 2: Secondary | ICD-10-CM | POA: Diagnosis not present

## 2024-01-23 DIAGNOSIS — I5022 Chronic systolic (congestive) heart failure: Secondary | ICD-10-CM | POA: Diagnosis not present

## 2024-01-28 ENCOUNTER — Other Ambulatory Visit: Payer: Self-pay | Admitting: Family Medicine

## 2024-01-28 ENCOUNTER — Other Ambulatory Visit: Payer: Self-pay | Admitting: Podiatry

## 2024-01-29 ENCOUNTER — Other Ambulatory Visit (HOSPITAL_COMMUNITY): Payer: Self-pay

## 2024-01-29 ENCOUNTER — Other Ambulatory Visit: Payer: Self-pay

## 2024-01-29 ENCOUNTER — Other Ambulatory Visit: Payer: Self-pay | Admitting: Family Medicine

## 2024-01-29 DIAGNOSIS — G4700 Insomnia, unspecified: Secondary | ICD-10-CM

## 2024-01-29 MED ORDER — HYDROXYZINE HCL 10 MG/5ML PO SYRP
10.0000 mg | ORAL_SOLUTION | Freq: Three times a day (TID) | ORAL | 3 refills | Status: DC | PRN
  Filled 2024-01-29: qty 30, 2d supply, fill #0
  Filled 2024-02-05: qty 30, 2d supply, fill #1
  Filled 2024-03-20: qty 30, 2d supply, fill #2

## 2024-01-29 MED ORDER — HYDROXYZINE HCL 10 MG PO TABS: 10.0000 mg | ORAL_TABLET | Freq: Every day | ORAL | 3 refills | Status: DC

## 2024-01-29 NOTE — Telephone Encounter (Signed)
 Copied from CRM [id number]. Topic: Clinical - Medication Question >> Jan 29, 2024  8:44 AM Evan [name] wrote: Reason for CRM: Patient would like to now why was he sent in a syrup form of  hydrOXYzine  (ATARAX ) 10 MG/5ML syrup?Patient stated that he does not have issues with swallowing pills. Patient would like to have the pills sent in.    Copied from CRM [id number]. Topic: General - Call Back - No Documentation >> Jan 29, 2024 10:22 AM [name] wrote: Reason for CRM: patient missed call please call pt back [phone].  Spoke with pt's daughter, Evan [name] on [place], to find out which pharmacy the pt needs the medication sent to. Evan [name] states that the patient doesn't need the tablets, that the syrup has already been sent in. Discovered that the pt's son had called requesting the tablets. Evan [name] states that they want the patient to have, Whatever medication Dr. Duanne prescribed. Spoke with pt's son, and advised him what Evan [name] advised. Son, Evan [name], verbalized understanding.   When speaking with Evan [name], she states that she thinks the patient has thrush for they Symbicort  and asks if an Rx can be sent in? They would like it sent to [facility] on [address].

## 2024-01-29 NOTE — Telephone Encounter (Signed)
 Copied from CRM [id number]. Topic: Clinical - Medication Question >> Jan 29, 2024  8:44 AM Evan [name] DEL wrote: Reason for CRM: Patient would like to now why was he sent in a syrup form of  hydrOXYzine  (ATARAX ) 10 MG/5ML syrup?Patient stated that he does not have issues with swallowing pills. Patient would like to have the pills sent in.

## 2024-01-30 NOTE — Patient Instructions (Signed)
 Visit Information  Thank you for taking time to visit with me today. Please don't hesitate to contact me if I can be of assistance to you before our next scheduled appointment.  Our next appointment is by telephone on Monday, November 17 at 2:00 PM Please call the care guide team at [phone] if you need to cancel or reschedule your appointment.   Following is a copy of your care plan:   Goals Addressed             This Visit's Progress    VBCI RN Care Plan related to Impaired Physical Mobility with Fall Risk       Problems:  Chronic Disease Management support and education needs related to Impaired Physical Mobility with Fall Risk   Goal: Over the next 90 days the Patient will continue to work with RN Care Manager and/or Social Worker to address care management and care coordination needs related to Impaired Physical Mobility with Fall risk  as evidenced by adherence to care management team scheduled appointments      Interventions:   Falls Interventions:  Musculoskelatal Symptoms Reviewed: Difficulty walking, Limited mobility, Unsteady gait Musculoskeletal Management Strategies: Adequate rest, Routine screening Musculoskeletal Self-Management Outcome: 3 (uncertain) Provided verbal education re: potential causes of falls and Fall prevention strategies Reviewed medications and discussed potential side effects of medications such as dizziness and frequent urination Advised patient of importance of notifying provider of falls Assessed for signs and symptoms of orthostatic hypotension Assessed for working status of life alert bracelet and patient adherence Assessed for DME needs, sent an in basket message to Dr. Duanne requesting orders for a new manual W/C and transfer tub bench  Discussed plans with patient for ongoing care management follow up and provided patient with direct contact information for care management team  Patient Self-Care Activities:  Attend all scheduled  provider appointments Call pharmacy for medication refills 3-7 days in advance of running out of medications Call provider office for new concerns or questions  Take medications as prescribed    Recommendation:   PCP Follow-up Specialty provider follow-up with Dr. Kate as directed  DME requests:  wheelchair tub transfer bench   Follow Up Plan:   Telephone follow up appointment date/time:  Monday, November 17 at 2:00 PM           VBCI RN Care Plan related to Impaired Skin Integrity       Problems:  Chronic Disease Management support and education needs related to Impaired Skin Integrity   Goal: Over the next 90 days the Patient will continue to work with Medical Illustrator and/or Social Worker to address care management and care coordination needs related to Impaired Skin Integrity  as evidenced by adherence to care management team scheduled appointments      Interventions:   Evaluation of current treatment plan related to Impaired Skin Integrity, self-management and patient's adherence to plan as established by provider Determined patient suffers from reoccurring skin breakdown to his sacrum, his daughter is managing, reports the wound is healing, as evidence by the wound is smaller with no signs of infection at this time Educated daughter re: skin breakdown prevention, educated re: signs/symptoms of infection and or worsening breakdown, the importance of inspecting skin daily and when to seek medical attention for new or worsening symptoms  Discussed plans with patient for ongoing care management follow up and provided patient with direct contact information for care management team  Patient Self-Care Activities:  Attend all scheduled provider  appointments Call pharmacy for medication refills 3-7 days in advance of running out of medications Call provider office for new concerns or questions  Take medications as prescribed   Work with the nurse care manager to address care  coordination needs and will continue to work with the clinical team to address health care and disease management related needs  Recommendation:   PCP Follow-up Specialty provider follow-up with Dr. Kate as directed  DME requests:  wheelchair tub transfer bench   Follow Up Plan:   Telephone follow up appointment date/time:  Monday, November 17 at 2:00 PM        VBCI RN Care Plan related to Impaired Swallowing with risk for Nutritional Imbalance       Problems:  Chronic Disease Management support and education needs related to Impaired Swallowing with risk for Nutritional Imbalance  Goal: Over the next 90 days the Patient will continue to work with RN Care Manager and/or Social Worker to address care management and care coordination needs related to Impaired Swallowing with risk for Nutritional Imbalance as evidenced by adherence to care management team scheduled appointments      Interventions:   Evaluation of current treatment plan related to Impaired Swallowing with risk for Nutritional Imbalance, self-management and patient's adherence to plan as established by provider Reviewed and discussed patient's risk for aspiration and nutritional imbalance due to dysphagia Educated daughter re: tips for avoiding aspiration and monitoring patient's oral intake to ensure adequate caloric/protein intake   Encouraged daughter to keep patient's doctor well informed re: his nutritional status  Instructed daughter to alert patient's doctor of any/all symptoms suggestive of aspiration and or seek emergency medical attention  Discussed plans with patient for ongoing care management follow up and provided patient with direct contact information for care management team  Patient Self-Care Activities:  Attend all scheduled provider appointments Call pharmacy for medication refills 3-7 days in advance of running out of medications Call provider office for new concerns or questions  Take medications  as prescribed    Recommendation:   PCP Follow-up Specialty provider follow-up with Dr. Kate as directed  DME requests:  wheelchair tub transfer bench   Follow Up Plan:   Telephone follow up appointment date/time:  Monday, November 17 at 2:00 PM        Please call 1-800-273-TALK (toll free, 24 hour hotline) if you are experiencing a Mental Health or Behavioral Health Crisis or need someone to talk to.  Daughter Evan [name] verbalized understanding of Care plan and visit instructions communicated this visit  Evan Ly RN BSN CCM [facility]  [facility], [facility] Nurse Care Coordinator  Direct Dial: [phone] Website: [email or web address]

## 2024-01-31 DIAGNOSIS — L89322 Pressure ulcer of left buttock, stage 2: Secondary | ICD-10-CM | POA: Diagnosis not present

## 2024-01-31 DIAGNOSIS — I5033 Acute on chronic diastolic (congestive) heart failure: Secondary | ICD-10-CM | POA: Diagnosis not present

## 2024-01-31 DIAGNOSIS — I69120 Aphasia following nontraumatic intracerebral hemorrhage: Secondary | ICD-10-CM | POA: Diagnosis not present

## 2024-01-31 DIAGNOSIS — J9611 Chronic respiratory failure with hypoxia: Secondary | ICD-10-CM | POA: Diagnosis not present

## 2024-01-31 DIAGNOSIS — I69151 Hemiplegia and hemiparesis following nontraumatic intracerebral hemorrhage affecting right dominant side: Secondary | ICD-10-CM | POA: Diagnosis not present

## 2024-01-31 DIAGNOSIS — L89152 Pressure ulcer of sacral region, stage 2: Secondary | ICD-10-CM | POA: Diagnosis not present

## 2024-01-31 DIAGNOSIS — J439 Emphysema, unspecified: Secondary | ICD-10-CM | POA: Diagnosis not present

## 2024-01-31 DIAGNOSIS — I13 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 failure and stage 1 through stage 4 chronic kidney disease, or unspecified chronic kidney disease: Secondary | ICD-10-CM | POA: Diagnosis not present

## 2024-02-01 DIAGNOSIS — I69151 Hemiplegia and hemiparesis following nontraumatic intracerebral hemorrhage affecting right dominant side: Secondary | ICD-10-CM | POA: Diagnosis not present

## 2024-02-01 DIAGNOSIS — I13 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 failure and stage 1 through stage 4 chronic kidney disease, or unspecified chronic kidney disease: Secondary | ICD-10-CM | POA: Diagnosis not present

## 2024-02-01 DIAGNOSIS — I5022 Chronic systolic (congestive) heart failure: Secondary | ICD-10-CM | POA: Diagnosis not present

## 2024-02-01 DIAGNOSIS — I5033 Acute on chronic diastolic (congestive) heart failure: Secondary | ICD-10-CM | POA: Diagnosis not present

## 2024-02-01 DIAGNOSIS — L89322 Pressure ulcer of left buttock, stage 2: Secondary | ICD-10-CM | POA: Diagnosis not present

## 2024-02-01 DIAGNOSIS — I69120 Aphasia following nontraumatic intracerebral hemorrhage: Secondary | ICD-10-CM | POA: Diagnosis not present

## 2024-02-01 DIAGNOSIS — L89152 Pressure ulcer of sacral region, stage 2: Secondary | ICD-10-CM | POA: Diagnosis not present

## 2024-02-01 DIAGNOSIS — J439 Emphysema, unspecified: Secondary | ICD-10-CM | POA: Diagnosis not present

## 2024-02-01 DIAGNOSIS — J9611 Chronic respiratory failure with hypoxia: Secondary | ICD-10-CM | POA: Diagnosis not present

## 2024-02-05 ENCOUNTER — Other Ambulatory Visit (HOSPITAL_COMMUNITY): Payer: Self-pay

## 2024-02-08 ENCOUNTER — Emergency Department (HOSPITAL_COMMUNITY)

## 2024-02-08 ENCOUNTER — Other Ambulatory Visit (HOSPITAL_COMMUNITY): Payer: Self-pay

## 2024-02-08 ENCOUNTER — Other Ambulatory Visit: Payer: Self-pay

## 2024-02-08 ENCOUNTER — Encounter (HOSPITAL_COMMUNITY): Payer: Self-pay

## 2024-02-08 ENCOUNTER — Inpatient Hospital Stay (HOSPITAL_COMMUNITY)
Admission: EM | Admit: 2024-02-08 | Discharge: 2024-02-16 | DRG: 871 | Disposition: A | Discharge status: Hospice | Attending: Internal Medicine | Admitting: Internal Medicine

## 2024-02-08 DIAGNOSIS — Z515 Encounter for palliative care: Secondary | ICD-10-CM | POA: Diagnosis not present

## 2024-02-08 DIAGNOSIS — B952 Enterococcus as the cause of diseases classified elsewhere: Secondary | ICD-10-CM | POA: Diagnosis present

## 2024-02-08 DIAGNOSIS — A4189 Other specified sepsis: Principal | ICD-10-CM | POA: Diagnosis present

## 2024-02-08 DIAGNOSIS — Z7901 Long term (current) use of anticoagulants: Secondary | ICD-10-CM | POA: Diagnosis not present

## 2024-02-08 DIAGNOSIS — I10 Essential (primary) hypertension: Secondary | ICD-10-CM | POA: Diagnosis not present

## 2024-02-08 DIAGNOSIS — B961 Klebsiella pneumoniae [K. pneumoniae] as the cause of diseases classified elsewhere: Secondary | ICD-10-CM | POA: Diagnosis present

## 2024-02-08 DIAGNOSIS — R059 Cough, unspecified: Secondary | ICD-10-CM | POA: Diagnosis not present

## 2024-02-08 DIAGNOSIS — Z86711 Personal history of pulmonary embolism: Secondary | ICD-10-CM

## 2024-02-08 DIAGNOSIS — I69351 Hemiplegia and hemiparesis following cerebral infarction affecting right dominant side: Secondary | ICD-10-CM | POA: Diagnosis not present

## 2024-02-08 DIAGNOSIS — N179 Acute kidney failure, unspecified: Secondary | ICD-10-CM | POA: Diagnosis not present

## 2024-02-08 DIAGNOSIS — J449 Chronic obstructive pulmonary disease, unspecified: Secondary | ICD-10-CM | POA: Diagnosis not present

## 2024-02-08 DIAGNOSIS — E8721 Acute metabolic acidosis: Secondary | ICD-10-CM | POA: Diagnosis not present

## 2024-02-08 DIAGNOSIS — N390 Urinary tract infection, site not specified: Secondary | ICD-10-CM | POA: Diagnosis not present

## 2024-02-08 DIAGNOSIS — Y846 Urinary catheterization as the cause of abnormal reaction of the patient, or of later complication, without mention of misadventure at the time of the procedure: Secondary | ICD-10-CM | POA: Diagnosis present

## 2024-02-08 DIAGNOSIS — L8990 Pressure ulcer of unspecified site, unspecified stage: Secondary | ICD-10-CM | POA: Insufficient documentation

## 2024-02-08 DIAGNOSIS — F0390 Unspecified dementia without behavioral disturbance: Secondary | ICD-10-CM | POA: Diagnosis present

## 2024-02-08 DIAGNOSIS — R918 Other nonspecific abnormal finding of lung field: Secondary | ICD-10-CM | POA: Diagnosis not present

## 2024-02-08 DIAGNOSIS — D696 Thrombocytopenia, unspecified: Secondary | ICD-10-CM | POA: Diagnosis present

## 2024-02-08 DIAGNOSIS — R0602 Shortness of breath: Secondary | ICD-10-CM | POA: Diagnosis not present

## 2024-02-08 DIAGNOSIS — I482 Chronic atrial fibrillation, unspecified: Secondary | ICD-10-CM | POA: Diagnosis present

## 2024-02-08 DIAGNOSIS — J849 Interstitial pulmonary disease, unspecified: Secondary | ICD-10-CM | POA: Diagnosis not present

## 2024-02-08 DIAGNOSIS — A419 Sepsis, unspecified organism: Secondary | ICD-10-CM

## 2024-02-08 DIAGNOSIS — Z66 Do not resuscitate: Secondary | ICD-10-CM | POA: Diagnosis not present

## 2024-02-08 DIAGNOSIS — R1310 Dysphagia, unspecified: Secondary | ICD-10-CM | POA: Diagnosis not present

## 2024-02-08 DIAGNOSIS — Z91048 Other nonmedicinal substance allergy status: Secondary | ICD-10-CM

## 2024-02-08 DIAGNOSIS — Z7401 Bed confinement status: Secondary | ICD-10-CM | POA: Diagnosis not present

## 2024-02-08 DIAGNOSIS — T83511A Infection and inflammatory reaction due to indwelling urethral catheter, initial encounter: Secondary | ICD-10-CM | POA: Diagnosis present

## 2024-02-08 DIAGNOSIS — J440 Chronic obstructive pulmonary disease with acute lower respiratory infection: Secondary | ICD-10-CM | POA: Diagnosis present

## 2024-02-08 DIAGNOSIS — R0603 Acute respiratory distress: Principal | ICD-10-CM

## 2024-02-08 DIAGNOSIS — I5030 Unspecified diastolic (congestive) heart failure: Secondary | ICD-10-CM | POA: Diagnosis not present

## 2024-02-08 DIAGNOSIS — R0989 Other specified symptoms and signs involving the circulatory and respiratory systems: Secondary | ICD-10-CM | POA: Diagnosis not present

## 2024-02-08 DIAGNOSIS — I13 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 failure and stage 1 through stage 4 chronic kidney disease, or unspecified chronic kidney disease: Secondary | ICD-10-CM | POA: Diagnosis present

## 2024-02-08 DIAGNOSIS — J811 Chronic pulmonary edema: Secondary | ICD-10-CM | POA: Diagnosis not present

## 2024-02-08 DIAGNOSIS — I4891 Unspecified atrial fibrillation: Secondary | ICD-10-CM

## 2024-02-08 DIAGNOSIS — Z883 Allergy status to other anti-infective agents status: Secondary | ICD-10-CM

## 2024-02-08 DIAGNOSIS — Z85118 Personal history of other malignant neoplasm of bronchus and lung: Secondary | ICD-10-CM

## 2024-02-08 DIAGNOSIS — Z888 Allergy status to other drugs, medicaments and biological substances status: Secondary | ICD-10-CM

## 2024-02-08 DIAGNOSIS — I5032 Chronic diastolic (congestive) heart failure: Secondary | ICD-10-CM | POA: Diagnosis present

## 2024-02-08 DIAGNOSIS — E785 Hyperlipidemia, unspecified: Secondary | ICD-10-CM | POA: Diagnosis present

## 2024-02-08 DIAGNOSIS — Z7951 Long term (current) use of inhaled steroids: Secondary | ICD-10-CM

## 2024-02-08 DIAGNOSIS — Z8673 Personal history of transient ischemic attack (TIA), and cerebral infarction without residual deficits: Secondary | ICD-10-CM | POA: Diagnosis not present

## 2024-02-08 DIAGNOSIS — F05 Delirium due to known physiological condition: Secondary | ICD-10-CM | POA: Diagnosis not present

## 2024-02-08 DIAGNOSIS — Z6826 Body mass index (BMI) 26.0-26.9, adult: Secondary | ICD-10-CM

## 2024-02-08 DIAGNOSIS — Z789 Other specified health status: Secondary | ICD-10-CM | POA: Diagnosis not present

## 2024-02-08 DIAGNOSIS — J9601 Acute respiratory failure with hypoxia: Principal | ICD-10-CM | POA: Diagnosis present

## 2024-02-08 DIAGNOSIS — Z7189 Other specified counseling: Secondary | ICD-10-CM | POA: Diagnosis not present

## 2024-02-08 DIAGNOSIS — J189 Pneumonia, unspecified organism: Secondary | ICD-10-CM | POA: Diagnosis not present

## 2024-02-08 DIAGNOSIS — A403 Sepsis due to Streptococcus pneumoniae: Secondary | ICD-10-CM | POA: Diagnosis present

## 2024-02-08 DIAGNOSIS — N182 Chronic kidney disease, stage 2 (mild): Secondary | ICD-10-CM | POA: Diagnosis not present

## 2024-02-08 DIAGNOSIS — L89153 Pressure ulcer of sacral region, stage 3: Secondary | ICD-10-CM | POA: Diagnosis present

## 2024-02-08 DIAGNOSIS — R6520 Severe sepsis without septic shock: Secondary | ICD-10-CM | POA: Diagnosis not present

## 2024-02-08 DIAGNOSIS — E43 Unspecified severe protein-calorie malnutrition: Secondary | ICD-10-CM | POA: Diagnosis not present

## 2024-02-08 DIAGNOSIS — J690 Pneumonitis due to inhalation of food and vomit: Secondary | ICD-10-CM | POA: Diagnosis present

## 2024-02-08 DIAGNOSIS — J90 Pleural effusion, not elsewhere classified: Secondary | ICD-10-CM | POA: Diagnosis not present

## 2024-02-08 DIAGNOSIS — J9690 Respiratory failure, unspecified, unspecified whether with hypoxia or hypercapnia: Secondary | ICD-10-CM | POA: Diagnosis not present

## 2024-02-08 DIAGNOSIS — Z91040 Latex allergy status: Secondary | ICD-10-CM

## 2024-02-08 DIAGNOSIS — I2510 Atherosclerotic heart disease of native coronary artery without angina pectoris: Secondary | ICD-10-CM | POA: Diagnosis present

## 2024-02-08 DIAGNOSIS — Z881 Allergy status to other antibiotic agents status: Secondary | ICD-10-CM

## 2024-02-08 DIAGNOSIS — R64 Cachexia: Secondary | ICD-10-CM | POA: Diagnosis present

## 2024-02-08 DIAGNOSIS — R7881 Bacteremia: Secondary | ICD-10-CM | POA: Diagnosis not present

## 2024-02-08 DIAGNOSIS — J168 Pneumonia due to other specified infectious organisms: Secondary | ICD-10-CM | POA: Diagnosis not present

## 2024-02-08 DIAGNOSIS — J13 Pneumonia due to Streptococcus pneumoniae: Secondary | ICD-10-CM | POA: Diagnosis present

## 2024-02-08 DIAGNOSIS — R638 Other symptoms and signs concerning food and fluid intake: Secondary | ICD-10-CM | POA: Diagnosis not present

## 2024-02-08 DIAGNOSIS — I69391 Dysphagia following cerebral infarction: Secondary | ICD-10-CM

## 2024-02-08 DIAGNOSIS — R5383 Other fatigue: Secondary | ICD-10-CM | POA: Diagnosis not present

## 2024-02-08 DIAGNOSIS — Z79899 Other long term (current) drug therapy: Secondary | ICD-10-CM

## 2024-02-08 DIAGNOSIS — R6521 Severe sepsis with septic shock: Secondary | ICD-10-CM | POA: Diagnosis present

## 2024-02-08 DIAGNOSIS — Z951 Presence of aortocoronary bypass graft: Secondary | ICD-10-CM

## 2024-02-08 DIAGNOSIS — R531 Weakness: Secondary | ICD-10-CM | POA: Diagnosis not present

## 2024-02-08 DIAGNOSIS — G9341 Metabolic encephalopathy: Secondary | ICD-10-CM | POA: Diagnosis not present

## 2024-02-08 DIAGNOSIS — Z711 Person with feared health complaint in whom no diagnosis is made: Secondary | ICD-10-CM | POA: Diagnosis not present

## 2024-02-08 DIAGNOSIS — I517 Cardiomegaly: Secondary | ICD-10-CM | POA: Diagnosis not present

## 2024-02-08 LAB — PROTIME-INR
INR: 1.8 — ABNORMAL HIGH (ref 0.8–1.2)
Prothrombin Time: 22.1 s — ABNORMAL HIGH (ref 11.4–15.2)

## 2024-02-08 LAB — I-STAT CHEM 8, ED
BUN: 34 mg/dL — ABNORMAL HIGH (ref 8–23)
Calcium, Ion: 1.2 mmol/L (ref 1.15–1.40)
Chloride: 104 mmol/L (ref 98–111)
Creatinine, Ser: 1.4 mg/dL — ABNORMAL HIGH (ref 0.61–1.24)
Glucose, Bld: 102 mg/dL — ABNORMAL HIGH (ref 70–99)
HCT: 28 % — ABNORMAL LOW (ref 39.0–52.0)
Hemoglobin: 9.5 g/dL — ABNORMAL LOW (ref 13.0–17.0)
Potassium: 3.9 mmol/L (ref 3.5–5.1)
Sodium: 138 mmol/L (ref 135–145)
TCO2: 19 mmol/L — ABNORMAL LOW (ref 22–32)

## 2024-02-08 LAB — CBC WITH DIFFERENTIAL/PLATELET
Basophils Absolute: 0 K/uL (ref 0.0–0.1)
Basophils Relative: 0 %
Eosinophils Absolute: 0 K/uL (ref 0.0–0.5)
Eosinophils Relative: 0 %
HCT: 28.5 % — ABNORMAL LOW (ref 39.0–52.0)
Hemoglobin: 8.8 g/dL — ABNORMAL LOW (ref 13.0–17.0)
Lymphocytes Relative: 4 %
Lymphs Abs: 0.4 K/uL — ABNORMAL LOW (ref 0.7–4.0)
MCH: 34.2 pg — ABNORMAL HIGH (ref 26.0–34.0)
MCHC: 30.9 g/dL (ref 30.0–36.0)
MCV: 110.9 fL — ABNORMAL HIGH (ref 80.0–100.0)
Monocytes Absolute: 0.1 K/uL (ref 0.1–1.0)
Monocytes Relative: 1 %
Neutro Abs: 10.5 K/uL — ABNORMAL HIGH (ref 1.7–7.7)
Neutrophils Relative %: 95 %
Platelets: 55 K/uL — ABNORMAL LOW (ref 150–400)
RBC: 2.57 MIL/uL — ABNORMAL LOW (ref 4.22–5.81)
RDW: 17.7 % — ABNORMAL HIGH (ref 11.5–15.5)
WBC: 11.1 K/uL — ABNORMAL HIGH (ref 4.0–10.5)
nRBC: 0 % (ref 0.0–0.2)

## 2024-02-08 LAB — I-STAT CG4 LACTIC ACID, ED: Lactic Acid, Venous: 3.1 mmol/L (ref 0.5–1.9)

## 2024-02-08 LAB — COMPREHENSIVE METABOLIC PANEL WITH GFR
ALT: 6 U/L (ref 0–44)
AST: 17 U/L (ref 15–41)
Albumin: 2.6 g/dL — ABNORMAL LOW (ref 3.5–5.0)
Alkaline Phosphatase: 104 U/L (ref 38–126)
Anion gap: 10 (ref 5–15)
BUN: 36 mg/dL — ABNORMAL HIGH (ref 8–23)
CO2: 19 mmol/L — ABNORMAL LOW (ref 22–32)
Calcium: 8.6 mg/dL — ABNORMAL LOW (ref 8.9–10.3)
Chloride: 104 mmol/L (ref 98–111)
Creatinine, Ser: 1.38 mg/dL — ABNORMAL HIGH (ref 0.61–1.24)
GFR, Estimated: 47 mL/min — ABNORMAL LOW (ref >60)
Glucose, Bld: 107 mg/dL — ABNORMAL HIGH (ref 70–99)
Potassium: 3.9 mmol/L (ref 3.5–5.1)
Sodium: 133 mmol/L — ABNORMAL LOW (ref 135–145)
Total Bilirubin: 0.9 mg/dL (ref 0.0–1.2)
Total Protein: 6.8 g/dL (ref 6.5–8.1)

## 2024-02-08 LAB — I-STAT VENOUS BLOOD GAS, ED
Acid-base deficit: 5 mmol/L — ABNORMAL HIGH (ref 0.0–2.0)
Bicarbonate: 19.1 mmol/L — ABNORMAL LOW (ref 20.0–28.0)
Calcium, Ion: 1.18 mmol/L (ref 1.15–1.40)
HCT: 28 % — ABNORMAL LOW (ref 39.0–52.0)
Hemoglobin: 9.5 g/dL — ABNORMAL LOW (ref 13.0–17.0)
O2 Saturation: 65 %
Potassium: 3.9 mmol/L (ref 3.5–5.1)
Sodium: 138 mmol/L (ref 135–145)
TCO2: 20 mmol/L — ABNORMAL LOW (ref 22–32)
pCO2, Ven: 32.4 mmHg — ABNORMAL LOW (ref 44–60)
pH, Ven: 7.379 (ref 7.25–7.43)
pO2, Ven: 34 mmHg (ref 32–45)

## 2024-02-08 LAB — RESP PANEL BY RT-PCR (RSV, FLU A&B, COVID)  RVPGX2
Influenza A by PCR: NEGATIVE
Influenza B by PCR: NEGATIVE
Resp Syncytial Virus by PCR: NEGATIVE
SARS Coronavirus 2 by RT PCR: NEGATIVE

## 2024-02-08 LAB — TROPONIN I (HIGH SENSITIVITY): Troponin I (High Sensitivity): 30 ng/L — ABNORMAL HIGH (ref <18)

## 2024-02-08 LAB — PROCALCITONIN: Procalcitonin: <0.1 ng/mL

## 2024-02-08 LAB — CBG MONITORING, ED: Glucose-Capillary: 104 mg/dL — ABNORMAL HIGH (ref 70–99)

## 2024-02-08 LAB — BRAIN NATRIURETIC PEPTIDE: B Natriuretic Peptide: 726.5 pg/mL — ABNORMAL HIGH (ref 0.0–100.0)

## 2024-02-08 LAB — POC OCCULT BLOOD, ED: Fecal Occult Bld: NEGATIVE

## 2024-02-08 MED ORDER — DEXMEDETOMIDINE HCL IN NACL 400 MCG/100ML IV SOLN
0.0000 ug/kg/h | INTRAVENOUS | Status: DC
  Administered 2024-02-08: 0.4 ug/kg/h via INTRAVENOUS
  Administered 2024-02-09: 0.5 ug/kg/h via INTRAVENOUS
  Filled 2024-02-08 (×2): qty 100

## 2024-02-08 MED ORDER — METHYLPREDNISOLONE SODIUM SUCC 125 MG IJ SOLR
125.0000 mg | INTRAMUSCULAR | Status: AC
  Administered 2024-02-08: 125 mg via INTRAVENOUS
  Filled 2024-02-08: qty 2

## 2024-02-08 MED ORDER — VANCOMYCIN HCL 1250 MG/250ML IV SOLN
1250.0000 mg | Freq: Once | INTRAVENOUS | Status: AC
  Administered 2024-02-08: 1250 mg via INTRAVENOUS
  Filled 2024-02-08: qty 250

## 2024-02-08 MED ORDER — NOREPINEPHRINE 4 MG/250ML-% IV SOLN
0.0000 ug/min | INTRAVENOUS | Status: DC
  Administered 2024-02-08: 3 ug/min via INTRAVENOUS
  Filled 2024-02-08: qty 250

## 2024-02-08 MED ORDER — DOCUSATE SODIUM 100 MG PO CAPS: 100.0000 mg | ORAL_CAPSULE | Freq: Two times a day (BID) | ORAL | Status: DC | PRN

## 2024-02-08 MED ORDER — METRONIDAZOLE 500 MG/100ML IV SOLN
500.0000 mg | Freq: Once | INTRAVENOUS | Status: AC
  Administered 2024-02-08: 500 mg via INTRAVENOUS
  Filled 2024-02-08: qty 100

## 2024-02-08 MED ORDER — ALBUTEROL SULFATE (2.5 MG/3ML) 0.083% IN NEBU
5.0000 mg | INHALATION_SOLUTION | Freq: Once | RESPIRATORY_TRACT | Status: AC
  Administered 2024-02-08: 5 mg via RESPIRATORY_TRACT
  Filled 2024-02-08: qty 6

## 2024-02-08 MED ORDER — CEFTRIAXONE SODIUM 2 G IJ SOLR: 2.0000 g | INTRAMUSCULAR | Status: DC

## 2024-02-08 MED ORDER — POLYETHYLENE GLYCOL 3350 17 G PO PACK: 17.0000 g | PACK | Freq: Every day | ORAL | Status: DC | PRN

## 2024-02-08 MED ORDER — IPRATROPIUM BROMIDE 0.02 % IN SOLN
0.5000 mg | Freq: Once | RESPIRATORY_TRACT | Status: AC
  Administered 2024-02-08: 0.5 mg via RESPIRATORY_TRACT
  Filled 2024-02-08: qty 2.5

## 2024-02-08 MED ORDER — LACTATED RINGERS IV BOLUS (SEPSIS): 1000.0000 mL | Freq: Once | INTRAVENOUS | Status: DC

## 2024-02-08 MED ORDER — SODIUM CHLORIDE 0.9 % IV SOLN
250.0000 mL | INTRAVENOUS | Status: AC
  Administered 2024-02-09: 250 mL via INTRAVENOUS

## 2024-02-08 MED ORDER — HEPARIN SODIUM (PORCINE) 5000 UNIT/ML IJ SOLN
5000.0000 [IU] | Freq: Three times a day (TID) | INTRAMUSCULAR | Status: DC
  Administered 2024-02-08 – 2024-02-09 (×2): 5000 [IU] via SUBCUTANEOUS
  Filled 2024-02-08: qty 1

## 2024-02-08 MED ORDER — VANCOMYCIN HCL IN DEXTROSE 1-5 GM/200ML-% IV SOLN: 1000.0000 mg | Freq: Once | INTRAVENOUS | Status: DC

## 2024-02-08 MED ORDER — LACTATED RINGERS IV BOLUS (SEPSIS)
500.0000 mL | Freq: Once | INTRAVENOUS | Status: AC
Start: 2024-02-08 — End: 2024-02-08
  Administered 2024-02-08: 500 mL via INTRAVENOUS

## 2024-02-08 MED ORDER — SODIUM CHLORIDE 0.9 % IV SOLN: 2.0000 g | INTRAVENOUS | Status: DC

## 2024-02-08 MED ORDER — CHLORHEXIDINE GLUCONATE CLOTH 2 % EX PADS
6.0000 | MEDICATED_PAD | Freq: Every day | CUTANEOUS | Status: DC
  Administered 2024-02-09 – 2024-02-16 (×7): 6 via TOPICAL

## 2024-02-08 MED ORDER — SODIUM CHLORIDE 0.9 % IV SOLN
2.0000 g | Freq: Once | INTRAVENOUS | Status: AC
  Administered 2024-02-08: 2 g via INTRAVENOUS
  Filled 2024-02-08: qty 12.5

## 2024-02-08 MED ORDER — FUROSEMIDE 10 MG/ML IJ SOLN: 60.0000 mg | Freq: Once | INTRAMUSCULAR | Status: DC

## 2024-02-08 MED ORDER — MIDAZOLAM HCL (PF) 2 MG/2ML IJ SOLN
1.0000 mg | Freq: Once | INTRAMUSCULAR | Status: AC
  Administered 2024-02-08: 1 mg via INTRAVENOUS
  Filled 2024-02-08: qty 2

## 2024-02-08 MED ORDER — LACTATED RINGERS IV SOLN: INTRAVENOUS | Status: DC

## 2024-02-08 NOTE — H&P (Signed)
 NAME:  KAORU REZENDES, MRN:  [id number], DOB:  09/01/1927, LOS: 0 ADMISSION DATE:  02/08/2024,  History of Present Illness:  Mr. Geter is a 89 year old male patient with a past medical history of remote lung cancer, recurrent aspiration, CAD status post CABG, diastolic heart failure with a EF 60 to 65% in June 2025 with also mild RV dysfunction, A-fib on Eliquis , left thalamic stroke, DNR/DNI presenting today to [facility] with acute hypoxic respiratory failure.  History was taken mostly from chart and patient's son who was at bedside with him.  Appears that he has been having productive cough for the past 2 days and an episode of rigors today which led to the family calling EMS.  On arrival, EMS noted that he was hypoxic and therefore he was brought into the emergency department.  In the ED his work of breathing was noted to be increased and therefore he was placed on BiPAP.  Was not tolerating BiPAP and was trying to remove the mask and therefore he was placed on Precedex.  Labs with elevated white count at 11.1.  Neutrophilic predominance at 95.  AKI was noted with a creatinine of 1.38 mg/dL from a baseline of 8.95 mg/dL.  Lactic acid was 3.1.  CT chest without contrast 11/6 demonstrates an underlying chronic interstitial lung disease with peripheral reticular changes.  Small bilateral pleural effusions.  Chronic right lower lobe consolidative process that is increased.  Therefore he was started on broad-spectrum antibiotics and admitted to the ICU for further management.  Discussed with his son at bedside who was very understanding of the current situation and we agreed to admit him to the ICU overnight without any heroic measures.  And depending on the trajectory in the next 12 hours they will decide on continuing of care or comfort measures only.  Confirmed DNR/DNI at bedside with his son.  Pertinent  Medical History  As above  Significant Hospital Events: Including procedures,  antibiotic start and stop dates in addition to other pertinent events   02/08/2024 -admitted to the ICU  Objective    Blood pressure 95/63, pulse 77, temperature 100.1 F (37.8 C), temperature source Rectal, resp. rate (!) 28, height 5' 2 (1.575 m), weight 63.5 kg, SpO2 100%.    FiO2 (%):  [40 %-60 %] 50 %   Intake/Output Summary (Last 24 hours) at 02/08/2024 2350 Last data filed at 02/08/2024 2239 Gross per 24 hour  Intake 18.97 ml  Output --  Net 18.97 ml   Filed Weights   02/08/24 1841  Weight: 63.5 kg    Examination: General: Frail, chronically ill HENT: Supple neck, reactive pupils Lungs: Diffuse crackles appreciated Cardiovascular: Normal S1, normal S2, irregularly irregular rhythm Abdomen: Soft nontender nondistended Extremities: +1 lower extremity edema up to the mid leg  Labs and imaging were reviewed  Assessment and Plan  Mr. Hidrogo is a 89 year old male patient with a past medical history of remote lung cancer, recurrent aspiration, CAD status post CABG, diastolic heart failure with a EF 60 to 65% in June 2025 with also mild RV dysfunction, A-fib on Eliquis , left thalamic stroke, DNR/DNI presenting today to [facility] with acute hypoxic respiratory failure.  # Acute hypoxic respiratory failure secondary to... # Community-acquired pneumonia versus aspiration pneumonia in the context of # Chronic interstitial lung disease and remote history of lung cancer and # Diastolic heart failure EF 60 to 65% in June 2025 BNP 754 (around his baseline) # AKI with creatinine 1.38 mg/dL  from a baseline of 1.04 mg/dL likely prerenal azotemia in the setting of sepsis # Metabolic encephalopathy secondary to the above # History of left thalamic stroke # A-fib on reduced dose Eliquis  # CAD status post CABG # History of Pseudomonas UTI  Neuro: Delirium precautions.  Resume home fluoxetine  when able.  Continue with Precedex. CVS: Maintain MAP greater than 65.  Will hold off on  further diuresis [given 60 mg IV in the ED].  Holding home metoprolol , Eliquis , rosuvastatin . ID: Cefepime  for antibiotic coverage for aspiration/community-acquired pneumonia. GI: Maintain n.p.o.  Can continue pantoprazole  once able.  And can advance diet if trajectory improves. Renal: Hold further diuresis.  Trend creatinine daily.  Avoid nephrotoxic agents.  Can consider LR 500 cc bolus should he become hypotensive. Heme: Heparin  subcu for DVT prophylaxis.  Holding home Eliquis  as unable to take p.o. Endo: POC 140-180  GOC: DNR/DNI confirmed at bedside.  Discussed with his son current situation and plan of monitoring trajectory in the next 12 hours and if any signs of suffering that they are willing to consider comfort measures only.   Critical care time: 60 minutes     Darrin Barn, MD [facility] 02/08/2024 11:58 PM

## 2024-02-08 NOTE — Progress Notes (Signed)
   02/08/24 2013  BiPAP/CPAP/SIPAP  BiPAP/CPAP/SIPAP Pt Type Adult  BiPAP/CPAP/SIPAP SERVO  Mask Type Full face mask  Mask Size Medium  IPAP 10 cmH20  EPAP 5 cmH2O  FiO2 (%) 40 %  Minute Ventilation 17.8  Leak 47  Peak Inspiratory Pressure (PIP) 14  Tidal Volume (Vt) 541  Patient Home Machine No  Patient Home Mask No  Patient Home Tubing No  Device Plugged into RED Power Outlet Yes  BiPAP/CPAP /SiPAP Vitals  Resp (!) 25  Bilateral Breath Sounds Diminished

## 2024-02-08 NOTE — ED Notes (Signed)
 Patient became agitated and stated he couldn't breath. Patient appeared pale and cyanotic. Provider notified and RT and both came to bedside. Patient was again trialed on bipap and provider ordered midazolam  to assist patient tolerance.

## 2024-02-08 NOTE — Sepsis Progress Note (Signed)
 Notified bedside nurse of need to draw repeat lactic acid. New shift ED RN via secure chat. Patient has been in CT and receiving respiratory care/ treatments.

## 2024-02-08 NOTE — Sepsis Progress Note (Signed)
 Elink monitoring for the code sepsis protocol.

## 2024-02-08 NOTE — ED Provider Notes (Signed)
 [place] EMERGENCY DEPARTMENT AT [facility] Provider Note   CSN: [id number] Arrival date & time: 02/08/24  8167     Patient presents with: Code Sepsis   Evan [name] is a 89 y.o. male.  {Add pertinent medical, surgical, social history, OB history to HPI:9458} 89 year old male with a complex past medical history history significant for CAD status post CABG, atrial fibrillation on Eliquis , left thalamic stroke with residual right hemiparesis, hypertension, hyperlipidemia, COPD, PE on Eliquis , lung adenocarcinoma, chronic anemia and thrombocytopenia, and indwelling Foley catheter who presents to the emergency department with shortness of breath and concerns for sepsis.  History obtained per patient and his daughter.  Lives at home and yesterday started developing a productive cough.  Today had an episode of rigors and they called 911.  Has been behaving himself.  No fevers at home.  Not complaining of any chest pain.  Has been compliant with his Eliquis .       Prior to Admission medications   Medication Sig Start Date End Date Taking? Authorizing Provider  acetaminophen  (TYLENOL ) 325 MG tablet Take 325-650 mg by mouth 2 (two) times daily as needed for mild pain (pain score 1-3), moderate pain (pain score 4-6), fever or headache.    [provider]  amoxicillin -clavulanate (AUGMENTIN ) 875-125 MG tablet Take 1 tablet by mouth 2 (two) times daily. 12/29/23   [name], MD  apixaban  (ELIQUIS ) 2.5 MG TABS tablet TAKE 1 TABLET TWICE DAILY 10/25/23   Varanasi, Jayadeep S, MD  budesonide -formoterol  (SYMBICORT ) 160-4.5 MCG/ACT inhaler Inhale 2 puffs into the lungs in the morning and at bedtime. 06/08/23   [name], MD  colchicine  0.6 MG tablet TAKE 1 TABLET BY MOUTH EVERY DAY 01/30/24   [name], MD  FEROSUL 325 (65 Fe) MG tablet TAKE 1 TABLET EVERY DAY Patient taking differently: Take 325 mg by mouth every other day. 10/27/23   [name], MD   FLUoxetine  (PROZAC ) 20 MG capsule TAKE 1 CAPSULE EVERY DAY 01/08/24   [name], MD  folic acid  (FOLVITE ) 1 MG tablet Take 1 tablet (1 mg total) by mouth daily. 01/22/24   [name], MD  furosemide  (LASIX ) 40 MG tablet TAKE 1 TABLET EVERY DAY 07/10/23   [name], MD  hydrOXYzine  (ATARAX ) 10 MG/5ML syrup Take 5 mLs (10 mg total) by mouth every 8 (eight) hours as needed for itching. 01/29/24   [name], MD  melatonin 3 MG TABS tablet Take 1 tablet (3 mg total) by mouth at bedtime as needed (insomnia). Patient not taking: Reported on 01/22/2024 10/03/23   [name], MD  Melatonin 5 MG CAPS Take 5 mg by mouth at bedtime.    [provider]  metoprolol  tartrate (LOPRESSOR ) 25 MG tablet TAKE 1/2 TAB DAILY. MAY TAKE AN ADDITIONAL 1/2 TAB DAILY IF NEEDED FOR CHEST PAIN OR ELEVATED BLOOD PRESSURE. 01/30/24   [name], MD  mupirocin  ointment (BACTROBAN ) 2 % APPLY TO AFFECTED AREA TWICE A DAY 01/29/24   Hyatt, [name], DPM  nitroGLYCERIN  (NITROSTAT ) 0.4 MG SL tablet Place 1 tablet (0.4 mg total) under the tongue every 5 (five) minutes as needed for chest pain. 05/16/23   [name], MD  nystatin  (MYCOSTATIN /NYSTOP ) powder Apply a thin layer topically to the affected area 2-3 (two to three) times daily. Patient not taking: Reported on 01/22/2024 08/03/23     OXYGEN  Inhale 2 L/min into the lungs as needed (Shortness of  breath).    [provider]  pantoprazole  (PROTONIX ) 40 MG tablet TAKE 1 TABLET EVERY MORNING 09/18/23   [name], MD  potassium chloride  SA (KLOR-CON  M) 20 MEQ tablet Take 1 tablet (20 mEq total) by mouth daily. 10/10/23   [name], MD  rosuvastatin  (CRESTOR ) 5 MG tablet Take 1 tablet (5 mg total) by mouth at bedtime. 08/21/23   [name], Mauricio Daniel, MD  senna-docusate (STIMULANT LAXATIVE) 8.6-50 MG tablet Take 2 tablets by mouth 2 (two) times daily as needed for constipation 07/21/22     tamsulosin  (FLOMAX ) 0.4  MG CAPS capsule TAKE 1 CAPSULE EVERY DAY WITH SUPPER 11/16/23   [name], MD  Zinc  Oxide (TRIPLE PASTE) 12.8 % ointment Apply topically 4 (four) times daily as needed (skin barrier). 01/22/24   [name], MD    Allergies: Feldene [piroxicam], Neomycin -polymyxin-hc, Statins, Latex, Tequin [gatifloxacin], Valium [diazepam], and Vibramycin  [doxycycline ]    Review of Systems  Updated Vital Signs There were no vitals taken for this visit.  Physical Exam Vitals and nursing note reviewed.  Constitutional:      General: He is not in acute distress.    Appearance: He is well-developed.  HENT:     Head: Normocephalic and atraumatic.     Right Ear: External ear normal.     Left Ear: External ear normal.     Nose: Nose normal.     Mouth/Throat:     Comments: [place] substance in the patient's mouth and family states that he was drinking chocolate before coming in. Eyes:     Extraocular Movements: Extraocular movements intact.     Conjunctiva/sclera: Conjunctivae normal.     Pupils: Pupils are equal, round, and reactive to light.  Cardiovascular:     Rate and Rhythm: Regular rhythm. Tachycardia present.     Heart sounds: Normal heart sounds.  Pulmonary:     Effort: Pulmonary effort is normal. No respiratory distress.     Breath sounds: Rales present.  Abdominal:     General: There is no distension.     Palpations: Abdomen is soft. There is no mass.     Tenderness: There is no abdominal tenderness. There is no guarding.  Genitourinary:    Penis: Normal.      Testes: Normal.     Comments: Indwelling Foley catheter Musculoskeletal:     Cervical back: Normal range of motion and neck supple.     Right lower leg: Edema (1+) present.     Left lower leg: Edema (1+) present.  Skin:    General: Skin is warm and dry.     Comments: Stage II sacral ulcer  Neurological:     Mental Status: He is alert. Mental status is at baseline.  Psychiatric:        Mood and Affect: Mood  normal.        Behavior: Behavior normal.     (all labs ordered are listed, but only abnormal results are displayed) Labs Reviewed - No data to display  EKG: None  Radiology: No results found.  {Document cardiac monitor, telemetry assessment procedure when appropriate:32947} Procedures    EMERGENCY DEPARTMENT US  CARDIAC EXAM Study: Limited Ultrasound of the Heart and Pericardium  INDICATIONS:Abnormal vital signs and Dyspnea Multiple views of the heart and pericardium were obtained in real-time with a multi-frequency probe.  PERFORMED AB:Fbdzoq IMAGES ARCHIVED?: No LIMITATIONS:  Body habitus VIEWS USED: Subcostal 4 chamber, Parasternal long axis, Parasternal short axis, and Apical 4 chamber  INTERPRETATION: Cardiac activity present, Pericardial effusioin absent, and Normal contractility   Medications Ordered in the ED - No data to display  Clinical Course as of 02/08/24 1924  Thu Feb 08, 2024  1923 Creatinine(!): 1.38 Baseline 1.2 [RP]  1923 Hemoglobin(!): 8.8 Baseline of 9 [RP]  1923 Platelets(!): 55 Baseline of 70 [RP]  1924 DNR/DNI confirmed with patient's daughter [RP]    Clinical Course User Index [RP] Yolande Lamar BROCKS, MD   {Click here for ABCD2, HEART and other calculators REFRESH Note before signing:1}                              Medical Decision Making Amount and/or Complexity of Data Reviewed Labs: ordered. Decision-making details documented in ED Course. Radiology: ordered.  Risk Prescription drug management. Decision regarding hospitalization.   ***  {Document critical care time when appropriate  Document review of labs and clinical decision tools ie CHADS2VASC2, etc  Document your independent review of radiology images and any outside records  Document your discussion with family members, caretakers and with consultants  Document social determinants of health affecting pt's care  Document your decision making why or why not admission,  treatments were needed:32947:::1}   Final diagnoses:  None    ED Discharge Orders     None

## 2024-02-08 NOTE — Telephone Encounter (Unsigned)
 Copied from CRM [id number]. Topic: Clinical - Medication Question >> Feb 08, 2024 11:55 AM Dedra B wrote: Reason for CRM: Pt daughter, [name], would like something called in for pt's cough. Preferred pharmacy is [facility] on [address]

## 2024-02-08 NOTE — Sepsis Progress Note (Addendum)
 Notified bedside nurse of need to draw repeat lactic acid. ED RN via Secure Chat.  1921--Acknowledged via secure chat.

## 2024-02-08 NOTE — ED Triage Notes (Signed)
 Pt coming in from home [facility] . Pt has been feeling unwell for a few days. Family reports a tremor from today.  Pt is on a non rebreather at 12l  Hr 80-110 Afib  Bp 108/68 Home o2 at 2 l pt was sating at 79  Rr 40  18 r fa  500 lr by ems Gcs 13

## 2024-02-08 NOTE — ED Notes (Signed)
 Patient continues to reach for bipap mask. Provider notified, orders placed.

## 2024-02-08 NOTE — Progress Notes (Signed)
 Pt transported from trauma C to CT and back on the servo BIPAP. No complications occurred during transport.

## 2024-02-08 NOTE — Progress Notes (Signed)
   02/08/24 2019  Oxygen  Therapy/Pulse Ox  O2 Device Nasal Cannula (PT did not tolerate BIPAP)  O2 Therapy Oxygen   O2 Flow Rate (L/min) 2 L/min

## 2024-02-09 DIAGNOSIS — R6521 Severe sepsis with septic shock: Secondary | ICD-10-CM

## 2024-02-09 DIAGNOSIS — J189 Pneumonia, unspecified organism: Secondary | ICD-10-CM

## 2024-02-09 DIAGNOSIS — A419 Sepsis, unspecified organism: Secondary | ICD-10-CM

## 2024-02-09 LAB — BLOOD CULTURE ID PANEL (REFLEXED) - BCID2

## 2024-02-09 LAB — CBC WITH DIFFERENTIAL/PLATELET
Abs Immature Granulocytes: 0.07 K/uL (ref 0.00–0.07)
Basophils Absolute: 0 K/uL (ref 0.0–0.1)
Basophils Relative: 0 %
Eosinophils Absolute: 0 K/uL (ref 0.0–0.5)
Eosinophils Relative: 0 %
HCT: 27.7 % — ABNORMAL LOW (ref 39.0–52.0)
Hemoglobin: 8.8 g/dL — ABNORMAL LOW (ref 13.0–17.0)
Immature Granulocytes: 1 %
Lymphocytes Relative: 3 %
Lymphs Abs: 0.3 K/uL — ABNORMAL LOW (ref 0.7–4.0)
MCH: 34.8 pg — ABNORMAL HIGH (ref 26.0–34.0)
MCHC: 31.8 g/dL (ref 30.0–36.0)
MCV: 109.5 fL — ABNORMAL HIGH (ref 80.0–100.0)
Monocytes Absolute: 0.1 K/uL (ref 0.1–1.0)
Monocytes Relative: 1 %
Neutro Abs: 10.6 K/uL — ABNORMAL HIGH (ref 1.7–7.7)
Neutrophils Relative %: 95 %
Platelets: 43 K/uL — ABNORMAL LOW (ref 150–400)
RBC: 2.53 MIL/uL — ABNORMAL LOW (ref 4.22–5.81)
RDW: 17.6 % — ABNORMAL HIGH (ref 11.5–15.5)
WBC: 11.2 K/uL — ABNORMAL HIGH (ref 4.0–10.5)
nRBC: 0 % (ref 0.0–0.2)

## 2024-02-09 LAB — BASIC METABOLIC PANEL WITH GFR
Anion gap: 14 (ref 5–15)
BUN: 31 mg/dL — ABNORMAL HIGH (ref 8–23)
CO2: 16 mmol/L — ABNORMAL LOW (ref 22–32)
Calcium: 8.1 mg/dL — ABNORMAL LOW (ref 8.9–10.3)
Chloride: 102 mmol/L (ref 98–111)
Creatinine, Ser: 1.43 mg/dL — ABNORMAL HIGH (ref 0.61–1.24)
GFR, Estimated: 45 mL/min — ABNORMAL LOW (ref >60)
Glucose, Bld: 300 mg/dL — ABNORMAL HIGH (ref 70–99)
Potassium: 3.4 mmol/L — ABNORMAL LOW (ref 3.5–5.1)
Sodium: 132 mmol/L — ABNORMAL LOW (ref 135–145)

## 2024-02-09 LAB — URINALYSIS, W/ REFLEX TO CULTURE (INFECTION SUSPECTED)
Bilirubin Urine: NEGATIVE
Glucose, UA: NEGATIVE mg/dL
Ketones, ur: NEGATIVE mg/dL
Nitrite: NEGATIVE
Protein, ur: 30 mg/dL — AB
Specific Gravity, Urine: 1.015 (ref 1.005–1.030)
WBC, UA: >50 WBC/hpf (ref 0–5)
pH: 5 (ref 5.0–8.0)

## 2024-02-09 LAB — MRSA NEXT GEN BY PCR, NASAL: MRSA by PCR Next Gen: DETECTED — AB

## 2024-02-09 LAB — GLUCOSE, CAPILLARY
Glucose-Capillary: 155 mg/dL — ABNORMAL HIGH (ref 70–99)
Glucose-Capillary: 140 mg/dL — ABNORMAL HIGH (ref 70–99)
Glucose-Capillary: 112 mg/dL — ABNORMAL HIGH (ref 70–99)

## 2024-02-09 LAB — TROPONIN I (HIGH SENSITIVITY): Troponin I (High Sensitivity): 34 ng/L — ABNORMAL HIGH (ref <18)

## 2024-02-09 LAB — I-STAT CG4 LACTIC ACID, ED: Lactic Acid, Venous: 1.9 mmol/L (ref 0.5–1.9)

## 2024-02-09 MED ORDER — ORAL CARE MOUTH RINSE: 15.0000 mL | OROMUCOSAL | Status: DC | PRN

## 2024-02-09 MED ORDER — ORAL CARE MOUTH RINSE
15.0000 mL | OROMUCOSAL | Status: DC
  Administered 2024-02-09 – 2024-02-10 (×4): 15 mL via OROMUCOSAL

## 2024-02-09 MED ORDER — INSULIN ASPART 100 UNIT/ML IJ SOLN
0.0000 [IU] | INTRAMUSCULAR | Status: DC
  Administered 2024-02-09: 2 [IU] via SUBCUTANEOUS
  Filled 2024-02-09: qty 2

## 2024-02-09 MED ORDER — SODIUM CHLORIDE 0.9 % IV SOLN
2.0000 g | INTRAVENOUS | Status: DC
  Administered 2024-02-09 – 2024-02-10 (×2): 2 g via INTRAVENOUS
  Filled 2024-02-09 (×2): qty 20

## 2024-02-09 NOTE — Progress Notes (Signed)
 Brief PCCM progress note  We rounded on patient midmorning to establish goals of care plan with family. At this time son would like to continue current interventions including BiPAP, vasopressors, and Precedex to allow patient time to recover.  However he understands that in the event patient remains BiPAP dependent over the next 24 to 48 hours medical recommendations would be to stop interventions and transition to comfort care.  Alyna Stensland D. Harris, NP-C [facility] Personal contact information can be found on Amion  If no contact or response made please call [phone] 02/09/2024, 11:20 AM

## 2024-02-09 NOTE — Progress Notes (Signed)
 PCCM Progress note  Notified by pharmacy that patient has Strep Pneumo in 4/4 blood cultures. Decision made to narrow antibiotics to Ceftriaxone  only.   Mitsuye Schrodt D. Harris, NP-C [facility] Personal contact information can be found on Amion  If no contact or response made please call [phone] 02/09/2024, 12:06 PM

## 2024-02-09 NOTE — Progress Notes (Signed)
 PHARMACY - PHYSICIAN COMMUNICATION CRITICAL VALUE ALERT - BLOOD CULTURE IDENTIFICATION (BCID)  Evan [name] is an 89 y.o. male who presented to [facility] on 02/08/2024 with a chief complaint of acute respiratory failure/pneumonia.    Assessment:  89 year old male admitted with CAP. Now with strep pneumo in 4/4 blood cultures.    Name of physician (or Provider) Contacted: Dr. Assaker/CCM team - Benton Lesches, NP   Current antibiotics: cefepime    Changes to prescribed antibiotics recommended:  Recommend change to ceftriaxone  2 gm daily to target strep pneumo>> Discussed with CCM and they agree   Results for orders placed or performed during the hospital encounter of 02/08/24  Blood Culture ID Panel (Reflexed) (Collected: 02/08/2024  6:56 PM)  Result Value Ref Range   Enterococcus faecalis NOT DETECTED NOT DETECTED   Enterococcus Faecium NOT DETECTED NOT DETECTED   Listeria monocytogenes NOT DETECTED NOT DETECTED   Staphylococcus species NOT DETECTED NOT DETECTED   Staphylococcus aureus (BCID) NOT DETECTED NOT DETECTED   Staphylococcus epidermidis NOT DETECTED NOT DETECTED   Staphylococcus lugdunensis NOT DETECTED NOT DETECTED   Streptococcus species DETECTED (A) NOT DETECTED   Streptococcus agalactiae NOT DETECTED NOT DETECTED   Streptococcus pneumoniae DETECTED (A) NOT DETECTED   Streptococcus pyogenes NOT DETECTED NOT DETECTED   A.calcoaceticus-baumannii NOT DETECTED NOT DETECTED   Bacteroides fragilis NOT DETECTED NOT DETECTED   Enterobacterales NOT DETECTED NOT DETECTED   Enterobacter cloacae complex NOT DETECTED NOT DETECTED   Escherichia coli NOT DETECTED NOT DETECTED   Klebsiella aerogenes NOT DETECTED NOT DETECTED   Klebsiella oxytoca NOT DETECTED NOT DETECTED   Klebsiella pneumoniae NOT DETECTED NOT DETECTED   Proteus species NOT DETECTED NOT DETECTED   Salmonella species NOT DETECTED NOT DETECTED   Serratia marcescens NOT DETECTED NOT DETECTED   Haemophilus influenzae  NOT DETECTED NOT DETECTED   Neisseria meningitidis NOT DETECTED NOT DETECTED   Pseudomonas aeruginosa NOT DETECTED NOT DETECTED   Stenotrophomonas maltophilia NOT DETECTED NOT DETECTED   Candida albicans NOT DETECTED NOT DETECTED   Candida auris NOT DETECTED NOT DETECTED   Candida glabrata NOT DETECTED NOT DETECTED   Candida krusei NOT DETECTED NOT DETECTED   Candida parapsilosis NOT DETECTED NOT DETECTED   Candida tropicalis NOT DETECTED NOT DETECTED   Cryptococcus neoformans/gattii NOT DETECTED NOT DETECTED    Damien Quiet, PharmD, BCPS, BCIDP Infectious Diseases Clinical Pharmacist Phone: [phone] 02/09/2024  12:04 PM

## 2024-02-09 NOTE — Consult Note (Signed)
 WOC Nurse Consult Note: Reason for Consult: requested to assess pressure injury to sacrum. Wound type:DTPI on sacrum with partial thickness Pressure Injury POA: Yes Measurement: multiple injuries - total aprox 10 cm x 8 cm Wound bed: 100% red with dark tissue in center, indicating the DTI. Drainage (amount, consistency, odor) Minimum Periwound: intact, new pink epithelize tissue for a previous wound. Dressing procedure/placement/frequency: Cleanse with saline, pat dry. Apply a single layer of Xeroform daily and top with sacrum foam dressing, change the foam every 3 days or PRN soiled or saturated, ok to lift the foam and reapply the Xeroform.  WOC team will not plan to follow further. Please reconsult if further assistance is needed. Thank-you,  Lela Holm MSN, RN, CNS.  (Phone [phone])

## 2024-02-09 NOTE — Progress Notes (Signed)
 Transported patient from ED to 3M04 on BIPAP.  Tolerated well.

## 2024-02-09 NOTE — ED Notes (Signed)
 Upon assuming care for pt, it was noted that a order for restraints was in place, restraints were not on the pt when care assumed. Restraints do not have documentation r/t no restraints being in place.

## 2024-02-09 NOTE — Progress Notes (Signed)
 NAME:  Evan [name], MRN:  [id number], DOB:  01/07/1928, LOS: 1 ADMISSION DATE:  02/08/2024,  History of Present Illness:  Evan [name] is a 89 year old male patient with a past medical history of remote lung cancer, recurrent aspiration, CAD status post CABG, diastolic heart failure with a EF 60 to 65% in June 2025 with also mild RV dysfunction, A-fib on Eliquis , left thalamic stroke, DNR/DNI presenting today to [facility] with acute hypoxic respiratory failure.  History was taken mostly from chart and patient's son who was at bedside with him.  Appears that he has been having productive cough for the past 2 days and an episode of rigors today which led to the family calling EMS.  On arrival, EMS noted that he was hypoxic and therefore he was brought into the emergency department.  In the ED his work of breathing was noted to be increased and therefore he was placed on BiPAP.  Was not tolerating BiPAP and was trying to remove the mask and therefore he was placed on Precedex.  Labs with elevated white count at 11.1.  Neutrophilic predominance at 95.  AKI was noted with a creatinine of 1.38 mg/dL from a baseline of 8.95 mg/dL.  Lactic acid was 3.1.  CT chest without contrast 11/6 demonstrates an underlying chronic interstitial lung disease with peripheral reticular changes.  Small bilateral pleural effusions.  Chronic right lower lobe consolidative process that is increased.  Therefore he was started on broad-spectrum antibiotics and admitted to the ICU for further management.  Discussed with his son at bedside who was very understanding of the current situation and we agreed to admit him to the ICU overnight without any heroic measures.  And depending on the trajectory in the next 12 hours they will decide on continuing of care or comfort measures only.  Confirmed DNR/DNI at bedside with his son.  Pertinent  Medical History  As above  Significant Hospital Events: Including procedures,  antibiotic start and stop dates in addition to other pertinent events   02/08/2024 -admitted to the ICU 11/7 No acute issues overnight while boarding in ED, remains on Precedex with BIPAP   Objective   Seen sleeping on Precedex drip with BIPAP in place     Blood pressure (!) 105/58, pulse 74, temperature 98.6 F (37 C), temperature source Axillary, resp. rate (!) 23, height 5' 2 (1.575 m), weight 63.5 kg, SpO2 100%.    FiO2 (%):  [40 %-60 %] 60 %   Intake/Output Summary (Last 24 hours) at 02/09/2024 0729 Last data filed at 02/08/2024 2239 Gross per 24 hour  Intake 18.97 ml  Output --  Net 18.97 ml   Filed Weights   02/08/24 1841  Weight: 63.5 kg    Examination: General: Acute on chronically ill appearing thin elderly male lying in bed on mechanical ventilation, in NAD HEENT: BIPAP mask in place , MM pink/moist, PERRL,  Neuro: Lightly sedated with BIPAP in place  CV: s1s2 regular rate and rhythm, no murmur, rubs, or gallops,  PULM:  Rhonchi bilaterally worse on right, tolerating BIPAP, no increased work of breathing GI: soft, bowel sounds active in all 4 quadrants, non-tender, non-distended Extremities: warm/dry, no edema  Skin: no rashes or lesions  Assessment and Plan  Evan [name] is a 89 year old male patient with a past medical history of remote lung cancer, recurrent aspiration, CAD status post CABG, diastolic heart failure with a EF 60 to 65% in June 2025 with also mild RV dysfunction, A-fib  on Eliquis , left thalamic stroke, DNR/DNI presenting today to [facility] with acute hypoxic respiratory failure.  Sepsis secondary to CAP vs aspiration PNA, POA -Presented with mildly elevated temp of 100.1, tachypnea, and mild tachycardia in the setting of elevated lactic of 3.1 and leukocytosis with CT evidence of infection  P: Admit ICU Continue BIPAP as below  Continue Cefepime   Pressors for MAP < 65  Monitor urine output  Acute hypoxic respiratory failure secondary  to community-acquired pneumonia versus aspiration pneumonia in the context of chronic interstitial lung disease and remote history of lung cancer  P: Continue BIPAP with low threshold to transition to comfort care as below  Head of bed elevated 30 degrees Follow intermittent chest x-ray and ABG Ensure adequate pulmonary hygiene  Follow cultures   Diastolic heart failure  -EF 60 to 65% in June 2025 BNP 754 (around his baseline) CAD status post CABG P: Continuous telemetry  Hold home statin given NPO status  Strict intake and output  Daily weight to assess volume status Daily assessment for need to diurese Closely monitor renal function and electrolytes   AKI  -Creatinine 1.38 mg/dL from a baseline of 8.95 mg/dL likely prerenal azotemia in the setting of sepsis History of Pseudomonas UTI P: Follow renal function  Monitor urine output Trend Bmet Avoid nephrotoxins Ensure adequate renal perfusion   Metabolic encephalopathy secondary to the above History of left thalamic stroke P: Maintain neuro protective measures; goal for eurothermia, euglycemia, eunatermia, normoxia, and PCO2 goal of 35-40 Nutrition and bowel regimen Aspirations precautions   A-fib on reduced dose Eliquis  P: Continuous telemetry  Prophylactic subcutaneous heparin   Hold home Eliquis    GOC DNR/DNI confirmed at bedside on admission.  Discussed with his son current situation and plan of monitoring trajectory in the next 12 hours and if any signs of suffering that they are willing to consider comfort measures only.   Critical care time:   CRITICAL CARE Performed by: Shonna Deiter D. Harris   Total critical care time: 38 minutes  Critical care time was exclusive of separately billable procedures and treating other patients.  Critical care was necessary to treat or prevent imminent or life-threatening deterioration.  Critical care was time spent personally by me on the following activities: development of  treatment plan with patient and/or surrogate as well as nursing, discussions with consultants, evaluation of patient's response to treatment, examination of patient, obtaining history from patient or surrogate, ordering and performing treatments and interventions, ordering and review of laboratory studies, ordering and review of radiographic studies, pulse oximetry and re-evaluation of patient's condition.  Huber Mathers D. Harris, NP-C [facility] Personal contact information can be found on Amion  If no contact or response made please call [phone] 02/09/2024, 7:48 AM

## 2024-02-10 LAB — GLUCOSE, CAPILLARY
Glucose-Capillary: 106 mg/dL — ABNORMAL HIGH (ref 70–99)
Glucose-Capillary: 89 mg/dL (ref 70–99)
Glucose-Capillary: 93 mg/dL (ref 70–99)
Glucose-Capillary: 93 mg/dL (ref 70–99)
Glucose-Capillary: 96 mg/dL (ref 70–99)
Glucose-Capillary: 84 mg/dL (ref 70–99)

## 2024-02-10 LAB — BASIC METABOLIC PANEL WITH GFR
Anion gap: 11 (ref 5–15)
BUN: 39 mg/dL — ABNORMAL HIGH (ref 8–23)
CO2: 19 mmol/L — ABNORMAL LOW (ref 22–32)
Calcium: 8.9 mg/dL (ref 8.9–10.3)
Chloride: 109 mmol/L (ref 98–111)
Creatinine, Ser: 1.34 mg/dL — ABNORMAL HIGH (ref 0.61–1.24)
GFR, Estimated: 49 mL/min — ABNORMAL LOW (ref >60)
Glucose, Bld: 105 mg/dL — ABNORMAL HIGH (ref 70–99)
Potassium: 3.3 mmol/L — ABNORMAL LOW (ref 3.5–5.1)
Sodium: 139 mmol/L (ref 135–145)

## 2024-02-10 LAB — CBC
HCT: 28.6 % — ABNORMAL LOW (ref 39.0–52.0)
Hemoglobin: 9.1 g/dL — ABNORMAL LOW (ref 13.0–17.0)
MCH: 34.6 pg — ABNORMAL HIGH (ref 26.0–34.0)
MCHC: 31.8 g/dL (ref 30.0–36.0)
MCV: 108.7 fL — ABNORMAL HIGH (ref 80.0–100.0)
Platelets: 43 K/uL — ABNORMAL LOW (ref 150–400)
RBC: 2.63 MIL/uL — ABNORMAL LOW (ref 4.22–5.81)
RDW: 17.7 % — ABNORMAL HIGH (ref 11.5–15.5)
WBC: 5.8 K/uL (ref 4.0–10.5)
nRBC: 0 % (ref 0.0–0.2)

## 2024-02-10 LAB — MAGNESIUM: Magnesium: 1.8 mg/dL (ref 1.7–2.4)

## 2024-02-10 MED ORDER — HYDROXYZINE HCL 25 MG PO TABS
25.0000 mg | ORAL_TABLET | Freq: Three times a day (TID) | ORAL | Status: DC | PRN
  Administered 2024-02-11 – 2024-02-14 (×4): 25 mg via ORAL
  Filled 2024-02-10 (×4): qty 1

## 2024-02-10 MED ORDER — MAGNESIUM SULFATE 2 GM/50ML IV SOLN
2.0000 g | Freq: Once | INTRAVENOUS | Status: AC
  Administered 2024-02-10: 2 g via INTRAVENOUS
  Filled 2024-02-10: qty 50

## 2024-02-10 MED ORDER — ORAL CARE MOUTH RINSE: 15.0000 mL | OROMUCOSAL | Status: DC | PRN

## 2024-02-10 MED ORDER — HEPARIN SODIUM (PORCINE) 5000 UNIT/ML IJ SOLN
5000.0000 [IU] | Freq: Two times a day (BID) | INTRAMUSCULAR | Status: DC
  Administered 2024-02-10 – 2024-02-16 (×13): 5000 [IU] via SUBCUTANEOUS
  Filled 2024-02-10 (×13): qty 1

## 2024-02-10 MED ORDER — FUROSEMIDE 10 MG/ML IJ SOLN
40.0000 mg | Freq: Once | INTRAMUSCULAR | Status: AC
  Administered 2024-02-10: 40 mg via INTRAVENOUS
  Filled 2024-02-10: qty 4

## 2024-02-10 MED ORDER — POTASSIUM CHLORIDE 10 MEQ/100ML IV SOLN
10.0000 meq | INTRAVENOUS | Status: AC
  Administered 2024-02-10 (×6): 10 meq via INTRAVENOUS
  Filled 2024-02-10 (×6): qty 100

## 2024-02-10 MED ORDER — MUPIROCIN 2 % EX OINT
TOPICAL_OINTMENT | Freq: Two times a day (BID) | CUTANEOUS | Status: AC
Start: 2024-02-10 — End: 2024-02-15
  Administered 2024-02-10 – 2024-02-14 (×3): 1 via NASAL
  Filled 2024-02-10 (×2): qty 22

## 2024-02-10 NOTE — Evaluation (Signed)
 Clinical/Bedside Swallow Evaluation Patient Details  Name: Evan [name] MRN: [id number] Date of Birth: [birth year]  Today's Date: 02/10/2024 Time: SLP Start Time (ACUTE ONLY): 1652 SLP Stop Time (ACUTE ONLY): 1727 SLP Time Calculation (min) (ACUTE ONLY): 35 min  Past Medical History:  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   Altered mental status 08/21/2018   Anginal chest pain at rest    Chronic non, controlled on Lorazepam    Anxiety    Arthritis    all over    BPH (benign prostatic hyperplasia)    CAD (coronary artery disease)    Cellulitis of arm, right 06/01/2018   Cellulitis of left axilla    Chronic lower back pain    Complication of anesthesia    problems making water afterwards   Depression    DJD (degenerative joint disease)    Esophageal ulcer without bleeding    bid ppi indefinitely   Fall 07/16/2018   Family history of adverse reaction to anesthesia    children w/PONV   GERD (gastroesophageal reflux disease)    Haemophilus influenzae septicemia (HCC) 03/12/2023   Headache    couple/week maybe (09/04/2015)   Hemochromatosis    Possible, elevated iron  stores, hook-like osteophytes on hand films, normal LFTs   Hepatitis    yellow jaundice as a baby   History of ASCVD    MULTIVESSEL   Hyperlipidemia    Hypertension    Hypotension    Mild aortic stenosis 06/23/2021   Osteoarthritis    Peripheral vascular disease    Pneumonia    several times; got a little now (09/04/2015)   Pulmonary embolism (HCC) 02/02/2018   Rash and nonspecific skin eruption 06/01/2018   Rheumatoid arthritis (HCC)    RSV (respiratory syncytial virus pneumonia) 05/15/2022   Stroke (HCC)    Subdural hematoma (HCC)    small after fall 09/2016-plavix  held,  neurosurgery consulted.  Asymptomatic.     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 PO Eliquis    Past Surgical History:  Past Surgical History:  Procedure Laterality Date   ARM SKIN LESION BIOPSY / EXCISION Left 09/02/2015   CATARACT EXTRACTION W/ INTRAOCULAR LENS  IMPLANT, BILATERAL Bilateral    CHOLECYSTECTOMY OPEN     CORNEAL TRANSPLANT Bilateral    one at [facility]; one at [facility]   CORONARY ANGIOPLASTY WITH STENT PLACEMENT     CORONARY ARTERY BYPASS GRAFT  1999   DESCENDING AORTIC ANEURYSM REPAIR W/ STENT     DE stent ostium into the right radial free graft at OM-, 02-2007   ESOPHAGOGASTRODUODENOSCOPY N/A 11/09/2014   Procedure: ESOPHAGOGASTRODUODENOSCOPY (EGD);  Surgeon: Norleen Hint, MD;  Location: [facility] ENDOSCOPY;  Service: Endoscopy;  Laterality: N/A;   INGUINAL HERNIA REPAIR     JOINT REPLACEMENT     LEFT HEART CATH AND CORS/GRAFTS ANGIOGRAPHY N/A 06/27/2017   Procedure: LEFT HEART CATH AND CORS/GRAFTS ANGIOGRAPHY;  Surgeon: Burnard Debby LABOR, MD;  Location: [facility] INVASIVE CV LAB;  Service: Cardiovascular;  Laterality: N/A;   NASAL SINUS SURGERY     SHOULDER OPEN ROTATOR CUFF REPAIR Right    TOTAL KNEE ARTHROPLASTY Bilateral    HPI:  Evan [name] is a 89 year old male patient with a past medical history of remote lung cancer, recurrent aspiration, CAD status post CABG, diastolic heart failure  with a EF 60 to 65% in June 2025 with also mild RV dysfunction, A-fib on Eliquis , left thalamic stroke, DNR/DNI presenting today to [facility] with acute hypoxic respiratory failure.MTL at baseline, hx of dysphagia    Assessment / Plan / Recommendation  Clinical Impression   Pt presents with a moderate oropharyngeal dysphagia and is at a high risk of aspiration. Safest diet recs remain NPO with ice chip trials following EXTENSIVE oral care upon request no more than 3x a day. ST to f/u with a MBS within a few days as medically appropriate to determine objective readiness for  PO intake.  Pt seen for skilled ST services to determine PO readiness. Pt presented with thick Nafziger secretions in his mouth that required extensive suctioning to clean and even still was not completely cleared. Pt's family reports he had hot chocolate on Thursday, and that he has been spitting up Chrostowski secretions since then. OME indicative of moderate left sided facial and labial weakness and his voice was aphonic with a very weak cough. Pt assessed with thin liquid, nectar thick, honey thick liquid, puree, and regular solids. He had overt s/sx with most PO trials, most significantly an immediate cough with thins, an immediate throat clearance with nectar thick, and wet vocal quality with honey thick liquid. He had difficulty with bolus control of puree, with moderate labial loss past his lower lip and a delayed swallow with reduced hyolaryngeal elevation. He had moderate oral residue with regular solids, poor manipulation. SLP educated the family on the risks of PO intake at this time and the need for a MBS to determine if aspiration is occurring and if PO intake is safe at this time. Family verbally acknowledged understanding but voiced concerns for nutritional and hydration needs. SLP instigated conversation about GOC given the pt's presentation this day- family expressed a strong desire to keep focus on limiting aspiration risk and opted to have him remain NPO until objective testing. Pt not cognitively able to contribute meaningfully to the conversation but nodded that he understood. Given his fragile health status, hx of dysphagia, and s/sx of aspiration today NPO is the safest recs. SLP to f/u with MBS to assess PO readiness.   SLP Visit Diagnosis: Dysphagia, unspecified (R13.10)    Aspiration Risk  Risk for inadequate nutrition/hydration;Severe aspiration risk    Diet Recommendation NPO;Ice chips PRN after oral care    Medication Administration: Via alternative means    Other  Recommendations  Oral Care Recommendations: Oral care QID;Oral care prior to ice chip/H20 Caregiver Recommendations: Have oral suction available;Other (Comment) (Frequent oral care and access to mouth moisturizer to keep oral cavity moist)     Assistance Recommended at Discharge    Functional Status Assessment Patient has had a recent decline in their functional status and/or demonstrates limited ability to make significant improvements in function in a reasonable and predictable amount of time  Frequency and Duration min 3x week  1 week       Prognosis Prognosis for improved oropharyngeal function: Guarded Barriers to Reach Goals: Severity of deficits      Swallow Study   General Date of Onset: 02/08/24 HPI: Mr. Plemons is a 89 year old male patient with a past medical history of remote lung cancer, recurrent aspiration, CAD status post CABG, diastolic heart failure with a EF 60 to 65% in June 2025 with also mild RV dysfunction, A-fib on Eliquis , left thalamic stroke, DNR/DNI presenting today to [facility] with acute hypoxic respiratory failure.MTL  at baseline, hx of dysphagia Type of Study: Bedside Swallow Evaluation Previous Swallow Assessment: 6/23 MBS (Recommend pt continue Dys 3 texture, thin liquids using a Provale cup provided to pt from SLP. Although pt's wife reports MTL use at home) Diet Prior to this Study: NPO Temperature Spikes Noted: No Respiratory Status: Nasal cannula (4L of O2 via [place]) History of Recent Intubation: No Behavior/Cognition: Cooperative;Requires cueing;Agitated;Pleasant mood Oral Cavity Assessment: Dried secretions;Excessive secretions;Other (comment) (Thick Bissonnette secretions, suspected from hot chocolate from Thursday) Oral Care Completed by SLP: Recent completion by staff (RN completed with SLP in room) Oral Cavity - Dentition: Poor condition Vision: Impaired for self-feeding Self-Feeding Abilities: Total assist Patient Positioning: Upright in bed Baseline Vocal  Quality: Aphonic;Low vocal intensity (Mostly aphonic but intermittently very weakly phonated) Volitional Cough: Weak Volitional Swallow: Unable to elicit    Oral/Motor/Sensory Function Overall Oral Motor/Sensory Function: Moderate impairment Facial ROM: Reduced left Facial Symmetry: Abnormal symmetry left Facial Strength: Reduced left Lingual ROM: Reduced right;Reduced left Lingual Strength: Reduced   Ice Chips Ice chips: Impaired Presentation: Spoon Oral Phase Impairments: Poor awareness of bolus Oral Phase Functional Implications: Prolonged oral transit Pharyngeal Phase Impairments: Suspected delayed Swallow;Multiple swallows   Thin Liquid Thin Liquid: Impaired Presentation: Straw Oral Phase Impairments: Reduced labial seal Oral Phase Functional Implications: Prolonged oral transit;Oral residue Pharyngeal  Phase Impairments: Cough - Immediate;Multiple swallows;Change in Vital Signs (HR increased, O2 remained at 99)    Nectar Thick Nectar Thick Liquid: Impaired Presentation: Straw Oral Phase Impairments: Poor awareness of bolus Oral phase functional implications: Oral residue;Prolonged oral transit Pharyngeal Phase Impairments: Wet Vocal Quality;Multiple swallows;Throat Clearing - Immediate   Honey Thick Honey Thick Liquid: Impaired Presentation: Straw Oral Phase Impairments: Poor awareness of bolus Oral Phase Functional Implications: Oral holding   Puree Puree: Impaired Presentation: Spoon Oral Phase Impairments: Poor awareness of bolus Oral Phase Functional Implications: Oral residue;Oral holding;Prolonged oral transit Pharyngeal Phase Impairments: Decreased hyoid-laryngeal movement;Suspected delayed Swallow;Multiple swallows;Wet Vocal Quality   Solid     Solid: Impaired Oral Phase Impairments: Impaired mastication;Poor awareness of bolus Oral Phase Functional Implications: Oral residue;Impaired mastication;Oral holding     Manuelita Blew M.S. CCC-SLP

## 2024-02-10 NOTE — Progress Notes (Signed)
 NAME:  JACOBO MONCRIEF, MRN:  [id number], DOB:  [birth year], LOS: 2 ADMISSION DATE:  02/08/2024,  History of Present Illness:  Mr. Lofstrom is a 89 year old male patient with a past medical history of remote lung cancer, recurrent aspiration, CAD status post CABG, diastolic heart failure with a EF 60 to 65% in June 2025 with also mild RV dysfunction, A-fib on Eliquis , left thalamic stroke, DNR/DNI presenting today to [facility] with acute hypoxic respiratory failure.  History was taken mostly from chart and patient's son who was at bedside with him.  Appears that he has been having productive cough for the past 2 days and an episode of rigors today which led to the family calling EMS.  On arrival, EMS noted that he was hypoxic and therefore he was brought into the emergency department.  In the ED his work of breathing was noted to be increased and therefore he was placed on BiPAP.  Was not tolerating BiPAP and was trying to remove the mask and therefore he was placed on Precedex.  Labs with elevated white count at 11.1.  Neutrophilic predominance at 95.  AKI was noted with a creatinine of 1.38 mg/dL from a baseline of 8.95 mg/dL.  Lactic acid was 3.1.  CT chest without contrast 11/6 demonstrates an underlying chronic interstitial lung disease with peripheral reticular changes.  Small bilateral pleural effusions.  Chronic right lower lobe consolidative process that is increased.  Therefore he was started on broad-spectrum antibiotics and admitted to the ICU for further management.  Discussed with his son at bedside who was very understanding of the current situation and we agreed to admit him to the ICU overnight without any heroic measures.  And depending on the trajectory in the next 12 hours they will decide on continuing of care or comfort measures only.  Confirmed DNR/DNI at bedside with his son.  Pertinent  Medical History  As above  Significant Hospital Events: Including procedures,  antibiotic start and stop dates in addition to other pertinent events   02/08/2024 -admitted to the ICU 11/7 No acute issues overnight while boarding in ED, remains on Precedex with BIPAP   Objective    Still encephalopathic, off Precedex since yesterday, still requiring BiPAP  Blood pressure 133/79, pulse 94, temperature 97.8 F (36.6 C), temperature source Axillary, resp. rate (!) 26, height 5' 2 (1.575 m), weight 62.5 kg, SpO2 98%.    Vent Mode: PCV;BIPAP FiO2 (%):  [36 %-40 %] 36 % Set Rate:  [8 bmp] 8 bmp PEEP:  [5 cmH20] 5 cmH20 Plateau Pressure:  [19 cmH20] 19 cmH20   Intake/Output Summary (Last 24 hours) at 02/10/2024 1230 Last data filed at 02/10/2024 1200 Gross per 24 hour  Intake 566.24 ml  Output 965 ml  Net -398.76 ml   Filed Weights   02/08/24 1841 02/09/24 1200 02/10/24 0500  Weight: 63.5 kg 63.2 kg 62.5 kg    Examination: General: Acute on chronically ill appearing thin elderly male lying in bed on mechanical ventilation, in NAD HEENT: BIPAP mask in place , MM pink/moist, PERRL,  Neuro: Lightly sedated with BIPAP in place  CV: s1s2 regular rate and rhythm, no murmur, rubs, or gallops,  PULM:  Rhonchi bilaterally worse on right, tolerating BIPAP, no increased work of breathing GI: soft, bowel sounds active in all 4 quadrants, non-tender, non-distended Extremities: warm/dry, no edema  Skin: no rashes or lesions  Assessment and Plan  Mr. Brotherton is a 89 year old male patient with a past medical  history of remote lung cancer, recurrent aspiration, CAD status post CABG, diastolic heart failure with a EF 60 to 65% in June 2025 with also mild RV dysfunction, A-fib on Eliquis , left thalamic stroke, DNR/DNI presenting to [facility] with acute hypoxic respiratory failure.    Sepsis secondary to CAP vs aspiration PNA, POA -Presented with mildly elevated temp of 100.1, tachypnea, and mild tachycardia in the setting of elevated lactic of 3.1 and leukocytosis with  CT evidence of infection  P: Admit ICU Continue BIPAP as below  Antibiotics narrowed down to ceftriaxone  Pressors for MAP < 65  Monitor urine output  Acute hypoxic respiratory failure secondary to community-acquired pneumonia versus aspiration pneumonia in the context of chronic interstitial lung disease and remote history of lung cancer  P: Continue BIPAP with low threshold to transition to comfort care as below  Head of bed elevated 30 degrees Follow intermittent chest x-ray and ABG Ensure adequate pulmonary hygiene  Follow cultures   Diastolic heart failure  -EF 60 to 65% in June 2025 BNP 754 (around his baseline) CAD status post CABG P: Continuous telemetry  Hold home statin given NPO status  Strict intake and output  Daily weight to assess volume status Daily assessment for need to diurese Closely monitor renal function and electrolytes   AKI  -Creatinine 1.38 mg/dL from a baseline of 8.95 mg/dL likely prerenal azotemia in the setting of sepsis History of Pseudomonas UTI P: Follow renal function  Monitor urine output Trend Bmet Avoid nephrotoxins Ensure adequate renal perfusion   Metabolic encephalopathy secondary to the above History of left thalamic stroke P: Maintain neuro protective measures; goal for eurothermia, euglycemia, eunatermia, normoxia, and PCO2 goal of 35-40 Nutrition and bowel regimen Aspirations precautions   A-fib on reduced dose Eliquis  -due to low platelets, will for start with DVT prophylaxis P: Continuous telemetry  Hold home Eliquis    GOC DNR/DNI confirmed at bedside on admission.  Discussed with his son current situation and plan of monitoring trajectory in the next 12 hours and if any signs of suffering that they are willing to consider comfort measures only.   Patient has been off Precedex, still encephalopathic, continue further workup for encephalopathy Still requiring BiPAP for ARF due to PNA Resumed home Lasix  of 40  daily Updated son at bedside     Lenny Drought, MD  [facility] Prefer epic messenger for cross cover needs   My critical care time: 40 minutes  Critical care time was exclusive of separately billable procedures and treating other patients.  Critical care was necessary to treat or prevent imminent or life-threatening deterioration.  Critical care was time spent personally by me on the following activities: development of treatment plan with patient and/or surrogate as well as nursing, discussions with consultants, evaluation of patient's response to treatment, examination of patient, obtaining history from patient or surrogate, ordering and performing treatments and interventions, ordering and review of laboratory studies, ordering and review of radiographic studies, pulse oximetry, re-evaluation of patient's condition and participation in multidisciplinary rounds.

## 2024-02-10 NOTE — Plan of Care (Signed)

## 2024-02-10 NOTE — Progress Notes (Signed)
 [facility] ADULT ICU REPLACEMENT PROTOCOL   The patient does apply for the [facility] Adult ICU Electrolyte Replacment Protocol based on the criteria listed below:   1.Exclusion criteria: TCTS, ECMO, Dialysis, and Myasthenia Gravis patients 2. Is GFR >/= 30 ml/min? Yes.    Patient's GFR today is 49 3. Is SCr </= 2? Yes.   Patient's SCr is 1.34 mg/dL 4. Did SCr increase >/= 0.5 in 24 hours? No. 5.Pt's weight >40kg  Yes.   6. Abnormal electrolyte(s): Potassium, Magnesium   7. Electrolytes replaced per protocol 8.  Call MD STAT for K+ </= 2.5, Phos </= 1, or Mag </= 1 Physician:  Dr. Kassie Medico A Brianna Bennett 02/10/2024 6:07 AM

## 2024-02-11 ENCOUNTER — Inpatient Hospital Stay (HOSPITAL_COMMUNITY)

## 2024-02-11 DIAGNOSIS — J9690 Respiratory failure, unspecified, unspecified whether with hypoxia or hypercapnia: Secondary | ICD-10-CM | POA: Diagnosis not present

## 2024-02-11 DIAGNOSIS — J811 Chronic pulmonary edema: Secondary | ICD-10-CM | POA: Diagnosis not present

## 2024-02-11 DIAGNOSIS — R918 Other nonspecific abnormal finding of lung field: Secondary | ICD-10-CM | POA: Diagnosis not present

## 2024-02-11 DIAGNOSIS — I517 Cardiomegaly: Secondary | ICD-10-CM | POA: Diagnosis not present

## 2024-02-11 LAB — URINE CULTURE: Culture: 30000 — AB

## 2024-02-11 LAB — CBC
HCT: 27.7 % — ABNORMAL LOW (ref 39.0–52.0)
Hemoglobin: 8.7 g/dL — ABNORMAL LOW (ref 13.0–17.0)
MCH: 34.4 pg — ABNORMAL HIGH (ref 26.0–34.0)
MCHC: 31.4 g/dL (ref 30.0–36.0)
MCV: 109.5 fL — ABNORMAL HIGH (ref 80.0–100.0)
Platelets: 46 K/uL — ABNORMAL LOW (ref 150–400)
RBC: 2.53 MIL/uL — ABNORMAL LOW (ref 4.22–5.81)
RDW: 17.7 % — ABNORMAL HIGH (ref 11.5–15.5)
WBC: 5.7 K/uL (ref 4.0–10.5)
nRBC: 0 % (ref 0.0–0.2)

## 2024-02-11 LAB — HEPATIC FUNCTION PANEL
ALT: 9 U/L (ref 0–44)
AST: 16 U/L (ref 15–41)
Albumin: 2.4 g/dL — ABNORMAL LOW (ref 3.5–5.0)
Alkaline Phosphatase: 83 U/L (ref 38–126)
Bilirubin, Direct: 0.3 mg/dL — ABNORMAL HIGH (ref 0.0–0.2)
Indirect Bilirubin: 0.7 mg/dL (ref 0.3–0.9)
Total Bilirubin: 1 mg/dL (ref 0.0–1.2)
Total Protein: 6.4 g/dL — ABNORMAL LOW (ref 6.5–8.1)

## 2024-02-11 LAB — BASIC METABOLIC PANEL WITH GFR
Anion gap: 15 (ref 5–15)
BUN: 40 mg/dL — ABNORMAL HIGH (ref 8–23)
CO2: 18 mmol/L — ABNORMAL LOW (ref 22–32)
Calcium: 8.8 mg/dL — ABNORMAL LOW (ref 8.9–10.3)
Chloride: 109 mmol/L (ref 98–111)
Creatinine, Ser: 1.46 mg/dL — ABNORMAL HIGH (ref 0.61–1.24)
GFR, Estimated: 44 mL/min — ABNORMAL LOW (ref >60)
Glucose, Bld: 79 mg/dL (ref 70–99)
Potassium: 3.3 mmol/L — ABNORMAL LOW (ref 3.5–5.1)
Sodium: 142 mmol/L (ref 135–145)

## 2024-02-11 LAB — GLUCOSE, CAPILLARY
Glucose-Capillary: 80 mg/dL (ref 70–99)
Glucose-Capillary: 82 mg/dL (ref 70–99)
Glucose-Capillary: 69 mg/dL — ABNORMAL LOW (ref 70–99)
Glucose-Capillary: 110 mg/dL — ABNORMAL HIGH (ref 70–99)
Glucose-Capillary: 73 mg/dL (ref 70–99)
Glucose-Capillary: 88 mg/dL (ref 70–99)
Glucose-Capillary: 99 mg/dL (ref 70–99)

## 2024-02-11 LAB — CULTURE, BLOOD (ROUTINE X 2)
Special Requests: ADEQUATE
Special Requests: ADEQUATE

## 2024-02-11 LAB — MAGNESIUM: Magnesium: 2.3 mg/dL (ref 1.7–2.4)

## 2024-02-11 LAB — AMMONIA: Ammonia: 17 umol/L (ref 9–35)

## 2024-02-11 MED ORDER — FUROSEMIDE 40 MG PO TABS: 40.0000 mg | ORAL_TABLET | Freq: Every day | ORAL | Status: DC

## 2024-02-11 MED ORDER — DEXTROSE 5 % IV SOLN: INTRAVENOUS | Status: AC

## 2024-02-11 MED ORDER — ORAL CARE MOUTH RINSE
15.0000 mL | OROMUCOSAL | Status: DC
  Administered 2024-02-12 – 2024-02-16 (×18): 15 mL via OROMUCOSAL

## 2024-02-11 MED ORDER — DEXTROSE 50 % IV SOLN
INTRAVENOUS | Status: AC
  Administered 2024-02-11: 50 mL
  Filled 2024-02-11: qty 50

## 2024-02-11 MED ORDER — ORAL CARE MOUTH RINSE: 15.0000 mL | OROMUCOSAL | Status: DC | PRN

## 2024-02-11 MED ORDER — SODIUM CHLORIDE 0.9 % IV SOLN
2.0000 g | Freq: Two times a day (BID) | INTRAVENOUS | Status: DC
  Administered 2024-02-11 (×2): 2 g via INTRAVENOUS
  Filled 2024-02-11 (×3): qty 2000

## 2024-02-11 MED ORDER — DEXMEDETOMIDINE HCL IN NACL 400 MCG/100ML IV SOLN
0.0000 ug/kg/h | INTRAVENOUS | Status: DC
  Administered 2024-02-11: 0.4 ug/kg/h via INTRAVENOUS
  Administered 2024-02-12: 0.2 ug/kg/h via INTRAVENOUS
  Filled 2024-02-11 (×2): qty 100

## 2024-02-11 MED ORDER — POTASSIUM CHLORIDE 10 MEQ/100ML IV SOLN
10.0000 meq | INTRAVENOUS | Status: AC
  Administered 2024-02-11 (×6): 10 meq via INTRAVENOUS
  Filled 2024-02-11 (×6): qty 100

## 2024-02-11 MED ORDER — MELATONIN 3 MG PO TABS
3.0000 mg | ORAL_TABLET | Freq: Every day | ORAL | Status: DC
  Administered 2024-02-11 – 2024-02-13 (×3): 3 mg via ORAL
  Filled 2024-02-11 (×3): qty 1

## 2024-02-11 MED ORDER — FUROSEMIDE 10 MG/ML IJ SOLN
40.0000 mg | Freq: Once | INTRAMUSCULAR | Status: AC
  Administered 2024-02-11: 40 mg via INTRAVENOUS
  Filled 2024-02-11: qty 4

## 2024-02-11 NOTE — Progress Notes (Signed)
 Speech Language Pathology Treatment: Dysphagia  Patient Details Name: Evan [name] MRN: [id number] DOB: February 15, 1928 Today's Date: 02/11/2024 Time: 1100-1130 SLP Time Calculation (min) (ACUTE ONLY): 30 min  Assessment / Plan / Recommendation Clinical Impression  Pt's family wishes to proceed with instrumental testing prior to making further decisions regarding GOC and resuming a PO diet. Unfortunately, radiology's scheduling cannot accommodate an MBS today. Recommend NPO status be maintained with the exception of ice chips in moderation after oral care and meds crushed in puree. Will f/u as scheduling allows for an MBS, though a PMT consult may also be beneficial to establish pt's goals moving forward.   Pt is repeatedly calling out for water, demonstrating reduced safety awareness and reasoning. Ice chips and sips of water regardless of delivery method or volume result in prolonged coughing that is intermittently productive of secretions. Coughing does not follow apple sauce but oral transit is disorganized with oral holding and anterior loss.  Pt's clinical presentation seems to represent a decline in swallowing function since most recent evaluation in June 2025. This could be secondary to a variety of factors, including increased supplemental O2 needs and mentation. Discussed pt's current presentation and history in detail with his son, who understands that he has aspirated on multiple prior MBS and this may continue to happen regardless of best efforts to use strategies due to his history of CVA, dysphagia, and age. He is at increased risk for adverse reactions related to aspiration cognitive impairment, deconditioning, and inadequate oral hygiene with multiple previous admissions for pneumonia.    HPI HPI: 89 yo male with PMH of remote lung cancer, recurrent aspiration, CAD s/p CABG, diastolic heart failure with EF 60-65% and mild RV dysfunction, A-fib on Eliquis , L thalamic CVA who presents to ED  11/6 with acute hypoxic respiratory failure. CT Chest shows slowly progressive consolidative disease at the RLL though when compared to more remote examinations, differential considerations include recurrent pna or aspiration vs neoplastic process. Pt has a history of dysphagia with multiple prior MBS indicating presbyphagia and intermittent aspiration of thin liquids. Recommendations vary between thin and nectar thick liquids, though most recent MBS 09/28/23 recommended continuing Dys 3 with thin liquids using rate control strategies and aspiration precautions. Aspiration was observed with variable sensation.      SLP Plan  MBS          Recommendations  Diet recommendations: NPO Medication Administration: Crushed with puree                  Oral care QID;Oral care prior to ice chip/H20   Frequent or constant Supervision/Assistance Dysphagia, unspecified (R13.10)     MBS     Damien Blumenthal, M.A., CCC-SLP Speech Language Pathology, Acute Rehabilitation Services  Secure Chat preferred [phone]   02/11/2024, 12:27 PM

## 2024-02-11 NOTE — Progress Notes (Signed)
 eLink Physician-Brief Progress Note Patient Name: JANAI BRANNIGAN DOB: 01-Dec-1927 MRN: [id number]   Date of Service  02/11/2024  HPI/Events of Note  89 year old male in ICU with respiratory failure which required noninvasive ventilation.  Overnight, he has become more more delirious and is screaming.  He had been on dexmedetomidine for agitation when he was on BiPAP.  Currently on no sedating medications.  eICU Interventions  Patient's chart briefly reviewed.  Video assessment of patient done.  Case discussed with bedside RN.  Will start low-dose dexmedetomidine for ICU delirium.     Intervention Category Major Interventions: Delirium, psychosis, severe agitation - evaluation and management  Jerilynn Berg 02/11/2024, 2:34 AM

## 2024-02-11 NOTE — Plan of Care (Signed)
  Problem: Clinical Measurements: Goal: Ability to maintain clinical measurements within normal limits will improve Outcome: Progressing Goal: Will remain free from infection Outcome: Progressing Goal: Diagnostic test results will improve Outcome: Not Progressing Goal: Respiratory complications will improve Outcome: Not Progressing Goal: Cardiovascular complication will be avoided Outcome: Progressing   Problem: Activity: Goal: Risk for activity intolerance will decrease Outcome: Not Progressing   Problem: Nutrition: Goal: Adequate nutrition will be maintained Outcome: Not Progressing   Problem: Coping: Goal: Level of anxiety will decrease Outcome: Not Progressing

## 2024-02-11 NOTE — Progress Notes (Signed)
 [facility] ADULT ICU REPLACEMENT PROTOCOL   The patient does apply for the [facility] Adult ICU Electrolyte Replacment Protocol based on the criteria listed below:   1.Exclusion criteria: TCTS, ECMO, Dialysis, and Myasthenia Gravis patients 2. Is GFR >/= 30 ml/min? Yes.    Patient's GFR today is 44 3. Is SCr </= 2? Yes.   Patient's SCr is 1.46 mg/dL 4. Did SCr increase >/= 0.5 in 24 hours? No. 5.Pt's weight >40kg  Yes.   6. Abnormal electrolyte(s): K+ = 3.3  7. Electrolytes replaced per protocol 8.  Call MD STAT for K+ </= 2.5, Phos </= 1, or Mag </= 1 Physician:  Joelyn, eMD   Rosina LOISE Hamilton 02/11/2024 5:38 AM

## 2024-02-11 NOTE — Progress Notes (Signed)
 NAME:  Evan [name], MRN:  [id number], DOB:  April 16, 1927, LOS: 3 ADMISSION DATE:  02/08/2024,  History of Present Illness:  Evan [name] is a 89 year old male patient with a past medical history of remote lung cancer, recurrent aspiration, CAD status post CABG, diastolic heart failure with a EF 60 to 65% in June 2025 with also mild RV dysfunction, A-fib on Eliquis , left thalamic stroke, DNR/DNI presenting today to [facility] with acute hypoxic respiratory failure.  History was taken mostly from chart and patient's son who was at bedside with him.  Appears that he has been having productive cough for the past 2 days and an episode of rigors today which led to the family calling EMS.  On arrival, EMS noted that he was hypoxic and therefore he was brought into the emergency department.  In the ED his work of breathing was noted to be increased and therefore he was placed on BiPAP.  Was not tolerating BiPAP and was trying to remove the mask and therefore he was placed on Precedex.  Labs with elevated white count at 11.1.  Neutrophilic predominance at 95.  AKI was noted with a creatinine of 1.38 mg/dL from a baseline of 8.95 mg/dL.  Lactic acid was 3.1.  CT chest without contrast 11/6 demonstrates an underlying chronic interstitial lung disease with peripheral reticular changes.  Small bilateral pleural effusions.  Chronic right lower lobe consolidative process that is increased.  Therefore he was started on broad-spectrum antibiotics and admitted to the ICU for further management.  Discussed with his son at bedside who was very understanding of the current situation and we agreed to admit him to the ICU overnight without any heroic measures.  And depending on the trajectory in the next 12 hours they will decide on continuing of care or comfort measures only.  Confirmed DNR/DNI at bedside with his son.  Pertinent  Medical History  As above  Significant Hospital Events: Including procedures,  antibiotic start and stop dates in addition to other pertinent events   02/08/2024 -admitted to the ICU 11/7 No acute issues overnight while boarding in ED, remains on Precedex with BIPAP   Objective   Still encephalopathic, ICU delirium required Precedex overnight Off BiPAP on nasal cannula, failed speech swallow, awaiting fees study   Blood pressure (!) 100/55, pulse 71, temperature (!) 97.2 F (36.2 C), temperature source Axillary, resp. rate (!) 27, height 5' 2 (1.575 m), weight 62.3 kg, SpO2 95%.    Vent Mode: PCV;BIPAP FiO2 (%):  [36 %-40 %] 36 % Set Rate:  [8 bmp] 8 bmp PEEP:  [5 cmH20] 5 cmH20   Intake/Output Summary (Last 24 hours) at 02/11/2024 0735 Last data filed at 02/11/2024 0600 Gross per 24 hour  Intake 676.37 ml  Output 1740 ml  Net -1063.63 ml   Filed Weights   02/09/24 1200 02/10/24 0500 02/11/24 0500  Weight: 63.2 kg 62.5 kg 62.3 kg    Examination: General: Acute on chronically ill appearing thin elderly male lying in bed on mechanical ventilation, in NAD HEENT: Off BiPAP, NCAT MM pink/moist, PERRL,  Neuro: AO X0, intact motor and sensation  CV: s1s2 regular rate and rhythm, no murmur, rubs, or gallops,  PULM:  Rhonchi bilaterally worse on right, off BiPAP, now on nasal cannula, no increased work of breathing GI: soft, bowel sounds active in all 4 quadrants, non-tender, non-distended Extremities: warm/dry, no edema  Skin: no rashes or lesions  Assessment and Plan  Evan [name] is a 89 year old  male patient with a past medical history of remote lung cancer, recurrent aspiration, CAD status post CABG, diastolic heart failure with a EF 60 to 65% in June 2025 with also mild RV dysfunction, A-fib on Eliquis , left thalamic stroke, DNR/DNI presenting to [facility] with acute hypoxic respiratory failure.    Sepsis secondary to CAP vs aspiration PNA, POA -Presented with mildly elevated temp of 100.1, tachypnea, and mild tachycardia in the setting of  elevated lactic of 3.1 and leukocytosis with CT evidence of infection  P: Admit ICU Now off BiPAP and on nasal cannula Antibiotics narrowed down to ampicillin  Pressors for MAP < 65  Monitor urine output  Acute hypoxic respiratory failure secondary to community-acquired pneumonia versus aspiration pneumonia in the context of chronic interstitial lung disease and remote history of lung cancer  P: Off BiPAP since 02/10/2024-on nasal cannula Head of bed elevated 30 degrees Follow intermittent chest x-ray and ABG Ensure adequate pulmonary hygiene  Follow cultures and  Diastolic heart failure  -EF 60 to 65% in June 2025 BNP 754 (around his baseline) CAD status post CABG P: Continuous telemetry  Hold home statin given NPO status  Strict intake and output  Daily weight to assess volume status Daily assessment for need to diurese Closely monitor renal function and electrolytes   AKI  -Creatinine 1.38 mg/dL from a baseline of 8.95 mg/dL likely prerenal azotemia in the setting of sepsis History of Pseudomonas UTI P: Follow renal function  Monitor urine output Trend Bmet Avoid nephrotoxins Ensure adequate renal perfusion   Metabolic encephalopathy secondary to the above History of left thalamic stroke P: Maintain neuro protective measures; goal for eurothermia, euglycemia, eunatermia, normoxia, and PCO2 goal of 35-40 Nutrition and bowel regimen Aspirations precautions   A-fib on reduced dose Eliquis  -due to low platelets, will for start with DVT prophylaxis P: Continuous telemetry  Hold home Eliquis    GOC DNR/I Spoke to family at bedside, patient is still encephalopathic, ICU delirium, per family patient will sometimes have similar events at home.  Of course this is worsening with infection.  Went into details regarding goals of care including quality of life.  At this time he wants a PEG tube for nutrition, if patient cannot tolerate p.o. he reports that he is not ready for  any comfort measures, or hospice. Will bring on board palliative care team to also assist.     Evan Drought, MD  [facility] Prefer epic messenger for cross cover needs   My critical care time: 35 minutes  Critical care time was exclusive of separately billable procedures and treating other patients.  Critical care was necessary to treat or prevent imminent or life-threatening deterioration.  Critical care was time spent personally by me on the following activities: development of treatment plan with patient and/or surrogate as well as nursing, discussions with consultants, evaluation of patient's response to treatment, examination of patient, obtaining history from patient or surrogate, ordering and performing treatments and interventions, ordering and review of laboratory studies, ordering and review of radiographic studies, pulse oximetry, re-evaluation of patient's condition and participation in multidisciplinary rounds.

## 2024-02-12 ENCOUNTER — Inpatient Hospital Stay (HOSPITAL_COMMUNITY)

## 2024-02-12 DIAGNOSIS — Z515 Encounter for palliative care: Secondary | ICD-10-CM

## 2024-02-12 DIAGNOSIS — Z7189 Other specified counseling: Secondary | ICD-10-CM

## 2024-02-12 LAB — GLUCOSE, CAPILLARY
Glucose-Capillary: 104 mg/dL — ABNORMAL HIGH (ref 70–99)
Glucose-Capillary: 107 mg/dL — ABNORMAL HIGH (ref 70–99)
Glucose-Capillary: 121 mg/dL — ABNORMAL HIGH (ref 70–99)
Glucose-Capillary: 107 mg/dL — ABNORMAL HIGH (ref 70–99)
Glucose-Capillary: 108 mg/dL — ABNORMAL HIGH (ref 70–99)
Glucose-Capillary: 113 mg/dL — ABNORMAL HIGH (ref 70–99)

## 2024-02-12 LAB — BASIC METABOLIC PANEL WITH GFR
Anion gap: 9 (ref 5–15)
BUN: 33 mg/dL — ABNORMAL HIGH (ref 8–23)
CO2: 20 mmol/L — ABNORMAL LOW (ref 22–32)
Calcium: 8.7 mg/dL — ABNORMAL LOW (ref 8.9–10.3)
Chloride: 112 mmol/L — ABNORMAL HIGH (ref 98–111)
Creatinine, Ser: 1.16 mg/dL (ref 0.61–1.24)
GFR, Estimated: 58 mL/min — ABNORMAL LOW (ref >60)
Glucose, Bld: 98 mg/dL (ref 70–99)
Potassium: 3.5 mmol/L (ref 3.5–5.1)
Sodium: 141 mmol/L (ref 135–145)

## 2024-02-12 LAB — CBC
HCT: 27.9 % — ABNORMAL LOW (ref 39.0–52.0)
Hemoglobin: 8.9 g/dL — ABNORMAL LOW (ref 13.0–17.0)
MCH: 34.9 pg — ABNORMAL HIGH (ref 26.0–34.0)
MCHC: 31.9 g/dL (ref 30.0–36.0)
MCV: 109.4 fL — ABNORMAL HIGH (ref 80.0–100.0)
Platelets: 39 K/uL — ABNORMAL LOW (ref 150–400)
RBC: 2.55 MIL/uL — ABNORMAL LOW (ref 4.22–5.81)
RDW: 17.5 % — ABNORMAL HIGH (ref 11.5–15.5)
WBC: 5.4 K/uL (ref 4.0–10.5)
nRBC: 0 % (ref 0.0–0.2)

## 2024-02-12 LAB — PHOSPHORUS: Phosphorus: 2.3 mg/dL — ABNORMAL LOW (ref 2.5–4.6)

## 2024-02-12 LAB — MAGNESIUM: Magnesium: 2 mg/dL (ref 1.7–2.4)

## 2024-02-12 MED ORDER — OSMOLITE 1.2 CAL PO LIQD
1000.0000 mL | ORAL | Status: DC
  Administered 2024-02-12 – 2024-02-13 (×2): 1000 mL
  Filled 2024-02-12 (×5): qty 1000

## 2024-02-12 MED ORDER — ACETAMINOPHEN 325 MG PO TABS
650.0000 mg | ORAL_TABLET | Freq: Four times a day (QID) | ORAL | Status: DC | PRN
  Administered 2024-02-12 – 2024-02-16 (×3): 650 mg via ORAL
  Filled 2024-02-12 (×3): qty 2

## 2024-02-12 MED ORDER — PROSOURCE TF20 ENFIT COMPATIBL EN LIQD
60.0000 mL | Freq: Every day | ENTERAL | Status: DC
  Administered 2024-02-12 – 2024-02-16 (×5): 60 mL
  Filled 2024-02-12 (×5): qty 60

## 2024-02-12 MED ORDER — POTASSIUM CHLORIDE 20 MEQ PO PACK
40.0000 meq | PACK | Freq: Once | ORAL | Status: AC
  Administered 2024-02-12: 40 meq
  Filled 2024-02-12: qty 2

## 2024-02-12 MED ORDER — SODIUM CHLORIDE 0.9 % IV SOLN
2.0000 g | Freq: Three times a day (TID) | INTRAVENOUS | Status: DC
  Administered 2024-02-12 – 2024-02-16 (×13): 2 g via INTRAVENOUS
  Filled 2024-02-12 (×14): qty 2000

## 2024-02-12 MED ORDER — THIAMINE MONONITRATE 100 MG PO TABS
100.0000 mg | ORAL_TABLET | Freq: Every day | ORAL | Status: DC
Start: 2024-02-12 — End: 2024-02-19
  Administered 2024-02-12 – 2024-02-16 (×5): 100 mg
  Filled 2024-02-12 (×5): qty 1

## 2024-02-12 NOTE — Progress Notes (Signed)
 PHARMACY NOTE:  ANTIMICROBIAL RENAL DOSAGE ADJUSTMENT  Current antimicrobial regimen includes a mismatch between antimicrobial dosage and estimated renal function.  As per policy approved by the Pharmacy & Therapeutics and Medical Executive Committees, the antimicrobial dosage will be adjusted accordingly.  Current antimicrobial dosage:  Amp 2gm IV Q12H  Indication: bacteremia and UTI  Renal Function:  Estimated Creatinine Clearance: 30.1 mL/min (by C-G formula based on SCr of 1.16 mg/dL). []      On intermittent HD, scheduled: []      On CRRT    Antimicrobial dosage has been changed to:  Amp 2gm IV Q8H   Bonne Whack D. Lendell, PharmD, BCPS, BCCCP 02/12/2024, 6:57 AM

## 2024-02-12 NOTE — Consult Note (Signed)
 Consultation Note Date: 02/12/2024   Patient Name: Evan [name]  DOB: 28-Dec-1927  MRN: [id number]  Age / Sex: 89 y.o., male  PCP: [name], MD Referring Physician: Sharie Bourbon, MD  Reason for Consultation: Establishing goals of care  HPI/Patient Profile: 89 y.o. male  with past medical history of dementia, remote lung cancer, ILD, recurrent aspiration, CAD status post CABG, diastolic heart failure with a EF 60 to 65% in June 2025 with also mild RV dysfunction, A-fib on Eliquis , left thalamic stroke admitted on 02/08/2024 with dyspnea.   Family called EMS due to concern for his respiratory status and he was found to be hypoxic. He was found to have lactic acidosis and AKI with elevated creatinine 1.38. CT chest without contrast 11/6 demonstrates an underlying chronic interstitial lung disease with peripheral reticular changes. Small bilateral pleural effusions. Chronic right lower lobe consolidative process that is increased. He was ultimately admitted for acute hypoxic respiratory failure secondary to CAP vs aspiration PNA and ILD, metabolic encephalopathy. Required BiPAP and precedex, weaning off and on [place] at time of my evaluation.  PMT has been consulted to assist with goals of care conversation. Patient is family to palliative service from 2024 and 2020 hospitalizations.   Clinical Assessment and Goals of Care:  I have reviewed medical records including EPIC notes, labs and imaging, discussed with RN, assessed the patient and then met at the bedside with patient's son Arley to discuss diagnosis prognosis, GOC, EOL wishes, disposition and options.  I introduced Palliative Medicine as specialized medical care for people living with serious illness. It focuses on providing relief from the symptoms and stress of a serious illness. The goal is to improve quality of life for both the patient and the family.  We discussed a brief life review of the patient  and then focused on their current illness.   I attempted to elicit values and goals of care important to the patient.    Medical History Review and Understanding:  We discussed patient's acute illness in the context of their chronic comorbidities.  Social History: Patient continues to live at home with his daughter providing care during the day and his 2 sons taking turns staying with him at night. Arley reports there is another daughter who is not very involved and he has 1 deceased daughter. 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  He is a man of faith and practices within Christianity. He enjoys watching the news and baseball.  Functional and Nutritional State: Albumin of 2.4 on 11/9. Swallowing markedly declined.   Palliative Symptoms: Patient denies current symptoms   Advance Directives: A detailed discussion regarding advanced directives was had. Son Arley reports that daughter [name] is primary HCPOA, though they work together to make decisions.   Code Status: An extensive conversation was had, covering concepts specific to code status, artifical feeding and hydration, continued IV antibiotics and rehospitalization.    Discussion: Patient was confused, though he told me that his quality of life at home is good and he has no symptoms or stresses that negatively impact him. He repeatedly asked for water. His son notes some concerns about patient's quality of life. He previously was able to enjoy going on fishing trips and going out to Sunday breakfasts, but now he stays in bed most of the day and just gets up for meals in the home.  We discussed the importance of considering treatment options in  the context of his quality of life. Extensive conversation about patient's nutrition, expected trajectory of worsening dysphagia and irreversible nature of dementia. Counseled on expectation of recurrent infections and my concern that patient  would still aspirate even with a PEG. I am also concerned that he would still want to enjoy oral intake given his repeated requests during our conversation. Recommended a family meeting to discuss goals of care. Son is agreeable and notes that patient's sister usually spearheads these things. He will consider inviting their sister Ellouise as well.  Attempted to call daughter but unable to reach. Left voicemail for daughter/HCPOA. Returned to the bedside in the afternoon and she was present in person as well as patient's daughter in social worker. Reviewed the above conversation and concerns. Daughter reports a good understanding of the severity of patient's illness and the information provided. She will inform PMT of preferred date/time for a family meeting. Daughter-in-law was having a very hard time and spoke with me in the waiting room. Tearful and hopeful that he will have at least one more Christmas. Emotional support provided.   The difference between aggressive medical intervention and comfort care was considered in light of the patient's goals of care. Hospice and Palliative Care services outpatient were explained and offered.   Discussed the importance of continued conversation with family and the medical providers regarding overall plan of care and treatment options, ensuring decisions are within the context of the patient's values and GOCs.   Questions and concerns were addressed.  Hard Choices booklet left for review. The family was encouraged to call with questions or concerns.  PMT will continue to support holistically.   SUMMARY OF RECOMMENDATIONS   -Continue DNR/DNI -Continue current care plan including cortrak, allow more times for outcomes -Advised against PEG -Recommended a full family meeting during today's discussion with patient's son Arley and daughter [name]. They seem to understand severity of patient's illness  -Ongoing GOC discussions -PMT will continue to follow and  support   Prognosis:  Poor long-term prognosis with ongoing decline and acute on chronic illness  Discharge Planning: To Be Determined      Primary Diagnoses: Present on Admission:  Acute hypoxic respiratory failure [facility])  Physical Exam Vitals and nursing note reviewed.  Constitutional:      General: He is not in acute distress.    Appearance: He is ill-appearing.     Interventions: Nasal cannula in place.  HENT:     Head: Normocephalic and atraumatic.  Cardiovascular:     Rate and Rhythm: Normal rate.  Pulmonary:     Effort: Tachypnea present. No respiratory distress.  Neurological:     Mental Status: He is confused.  Psychiatric:        Cognition and Memory: Cognition is impaired.    Vital Signs: BP (!) 100/53   Pulse 64   Temp 98.1 F (36.7 C) (Oral)   Resp (!) 24   Ht 5' 2 (1.575 m)   Wt 61.8 kg   SpO2 95%   BMI 24.92 kg/m  Pain Scale: CPOT   Pain Score: 0-No pain   SpO2: SpO2: 95 % O2 Device:SpO2: 95 % O2 Flow Rate: .O2 Flow Rate (L/min): 4 L/min   Quintyn Dombek SHAUNNA Fell, PA-C  Palliative Medicine Team Team phone # [phone]  Thank you for allowing the Palliative Medicine Team to assist in the care of this patient. Please utilize secure chat with additional questions, if there is no response within 30 minutes please call the above  phone number.  Palliative Medicine Team providers are available by phone from 7am to 7pm daily and can be reached through the team cell phone.  Should this patient require assistance outside of these hours, please call the patient's attending physician.    Time Total: 95  Visit consisted of counseling and education dealing with the complex and emotionally intense issues of symptom management and palliative care in the setting of serious and potentially life-threatening illness. Greater than 50% of this time was spent counseling and coordinating care related to the above assessment and plan.  Personally spent 95 minutes  in patient care including extensive chart review (labs, imaging, progress/consult notes, vital signs), medically appropraite exam, discussed with treatment team, education to patient, family, and staff, documenting clinical information, medication review and management, coordination of care, and available advanced directive documents.

## 2024-02-12 NOTE — TOC CM/SW Note (Signed)
 Transition of Care [facility]) - Inpatient Brief Assessment   Patient Details  Name: Evan [name] MRN: [id number] Date of Birth: December 07, 1927  Transition of Care [facility]) CM/SW Contact:    Tom-Johnson, Solomia Harrell Daphne, RN Phone Number: 02/12/2024, 1:53 PM   Clinical Narrative:  Patient presented to the ED with productive Cough, Shortness of Breath, Rigors. Admitted with Acute Respiratory Failure. Currently on IV abx, 4L O2, on 2L O2 at home baseline. Has hx of CAD s/p CABG, Lung cancer, Diastolic HF, A-Fib on Eliquis , Lt Thalamic Stroke, COPD, Anemia, Thrombocytopenia. GOC ongoing with Palliative.   CM spoke with patient and son, Arley at bedside and daughter, Michaelle Marsh via phone.  Patient is from home, has four children. Three of his children does around the clock care. Has home health disciplines with [facility]. [employer] does home O2 supplies. Has all necessary DME's at home. PCP is Pickard, Butler DASEN, MD and uses [facility] and [facility] on [address] in [place].   No ICM needs or recommendations noted at this time.  Patient not Medically ready for discharge.  CM will continue to follow as patient progresses with care towards discharge.      Transition of Care Asessment: Insurance and Status: Insurance coverage has been reviewed Patient has primary care physician: Yes Home environment has been reviewed: Yes Prior level of function:: Family does around the clock care. Prior/Current Home Services: Current home services Recruitment Consultant) Social Drivers of Health Review: SDOH reviewed no interventions necessary Readmission risk has been reviewed: Yes Transition of care needs: transition of care needs identified, TOC will continue to follow

## 2024-02-12 NOTE — Progress Notes (Signed)
 Initial Nutrition Assessment  DOCUMENTATION CODES:  Severe malnutrition in context of chronic illness  INTERVENTION:  Initiate tube feeding via Cortrak: Osmolite 1.2 at 45 ml/h (1080 ml per day) *Start at 15ml and advance by 10ml q8h to goal  Prosource TF20 60 ml once daily  Provides 1376 kcal, 80 gm protein, 886 ml free water daily  Monitor magnesium  and phosphorus daily x 4 occurrences, MD to replete as needed, as pt is at risk for refeeding syndrome given severe malnutrition.  Start thiamine 100mg  daily x7 days  NUTRITION DIAGNOSIS:  Severe Malnutrition related to chronic illness (HF, dysphagia) as evidenced by severe fat depletion, severe muscle depletion.  GOAL:  Patient will meet greater than or equal to 90% of their needs  MONITOR:  Diet advancement, Labs, Weight trends, TF tolerance  REASON FOR ASSESSMENT:  Consult Enteral/tube feeding initiation and management  ASSESSMENT:  Pt admitted with acute hypoxic respiratory failure. PMH significant for lung cancer, recurrent aspiration, CAD s/p CABG, diastolic heart failure, afib, left thalamic stroke.   11/6: admitted; CT chest without contract shows chronic ILD with peripheral reticular changes; small bilateral pleural effusions 11/10: MBS- now presents with profound oropharyngeal dysphagia secondary to both the chronicity and volume of aspiration as well as volume of pharyngeal residue; continue NPO except ice chips; Cortrak placed  Respiratory failure noted to be r/t CAP vs aspiration PNA  Palliative Care consulted to carry on goals of care conversation.  Further discussion regarding long term nutrition support access and desires with family pending.   SLP performed MBS today however proceeding with Cortrak placement to support nutrition needs in the meantime. Of note pt has a prior history of dysphagia with multiple MBS most recently being in June with recommendations for dysphagia 3 with thin liquids.   Checked in  with patient at bedside. He is hard of hearing but hears best on left side. He is pleasant though continues to ask for water. His son reports that he has not eaten in about 3-4 days.  His son at bedside assists with some nutrition related history though limited as pt being taken down for MBS during assessment. He states that the patient comes from home with care assistance from his children. His appetite and intake varies depending on the day. He mentions that pt loves oysters and shrimp.   No further nutrition/weight history provided.   Review of weight history on file reflects a 12.3% weight loss between 03/13/23-08/25/23 which is a clinically significant weight loss. Though it appears pt has had a slight increase in weight since 05/23 (+1.7 kg).   Medications: melatonin,  IV abx, D5 @ 41ml/hr  Labs:  BUN 33 GFR 58 Hgb 8.9 CBG's 73-107 x24 hours  NUTRITION - FOCUSED PHYSICAL EXAM:  Flowsheet Row Most Recent Value  Orbital Region Severe depletion  Upper Arm Region Severe depletion  Thoracic and Lumbar Region Severe depletion  Buccal Region Severe depletion  Temple Region Severe depletion  Clavicle Bone Region Severe depletion  Clavicle and Acromion Bone Region Severe depletion  Scapular Bone Region Severe depletion  Dorsal Hand Severe depletion  Patellar Region Severe depletion  Anterior Thigh Region Severe depletion  Posterior Calf Region Severe depletion  Edema (RD Assessment) None  Hair Reviewed  Eyes Reviewed  Mouth Reviewed  Skin Other (Comment)  [bruising to right lower extremity]  Nails Reviewed    Diet Order:   Diet Order             Diet NPO time specified  Diet effective now                   EDUCATION NEEDS:  Not appropriate for education at this time  Skin:  Skin Assessment: Skin Integrity Issues: Skin Integrity Issues:: DTI DTI: sacrum  Last BM:  11/9 type 6 medium  Height:  Ht Readings from Last 1 Encounters:  02/08/24 5' 2 (1.575 m)     Weight:  Wt Readings from Last 1 Encounters:  02/12/24 61.8 kg   BMI:  Body mass index is 24.92 kg/m.  Estimated Nutritional Needs:  Kcal:  1300-1500 Protein:  75-85g Fluid:  >/=1.5L  Royce Maris, RDN, LDN Clinical Nutrition See AMiON for contact information.

## 2024-02-12 NOTE — Procedures (Signed)
 Cortrak  Person Inserting Tube:  Evan [name], Olivia SAUNDERS, RD Tube Type:  Cortrak - 43 inches Tube Size:  10 Tube Location:  Left nare Secured by: Bridle Initial Placement:  Gastric Technique Used to Measure Tube Placement:  Marking at nare/corner of mouth Cortrak Secured At:  69 cm Initial Placement Verification:  Cortrak device (Registered Dieticians Only)  Cortrak Tube Team Note:  Consult received to place a Cortrak feeding tube.   No x-ray is required. RN may begin using tube.   If the tube becomes dislodged please keep the tube and contact the Cortrak team at [email or web address] for replacement.  If after hours and replacement cannot be delayed, place a NG tube and confirm placement with an abdominal x-ray.    Olivia Kenning, RD Registered Dietitian  See Amion for more information

## 2024-02-12 NOTE — Progress Notes (Addendum)
 NAME:  Evan [name], MRN:  [id number], DOB:  November 17, 1927, LOS: 4 ADMISSION DATE:  02/08/2024,  History of Present Illness:  [name] is a 89 year old male patient with a past medical history of remote lung cancer, recurrent aspiration, CAD status post CABG, diastolic heart failure with a EF 60 to 65% in June 2025 with also mild RV dysfunction, A-fib on Eliquis , left thalamic stroke, DNR/DNI presenting today to [facility] with acute hypoxic respiratory failure.  History was taken mostly from chart and patient's son who was at bedside with him.  Appears that he has been having productive cough for the past 2 days and an episode of rigors today which led to the family calling EMS.  On arrival, EMS noted that he was hypoxic and therefore he was brought into the emergency department.  In the ED his work of breathing was noted to be increased and therefore he was placed on BiPAP.  Was not tolerating BiPAP and was trying to remove the mask and therefore he was placed on Precedex.  Labs with elevated white count at 11.1.  Neutrophilic predominance at 95.  AKI was noted with a creatinine of 1.38 mg/dL from a baseline of 8.95 mg/dL.  Lactic acid was 3.1.  CT chest without contrast 11/6 demonstrates an underlying chronic interstitial lung disease with peripheral reticular changes.  Small bilateral pleural effusions.  Chronic right lower lobe consolidative process that is increased.  Therefore he was started on broad-spectrum antibiotics and admitted to the ICU for further management.  Discussed with his son at bedside who was very understanding of the current situation and we agreed to admit him to the ICU overnight without any heroic measures.  And depending on the trajectory in the next 12 hours they will decide on continuing of care or comfort measures only.  Confirmed DNR/DNI at bedside with his son.  Pertinent  Medical History  As above  Significant Hospital Events: Including procedures,  antibiotic start and stop dates in addition to other pertinent events   02/08/2024 -admitted to the ICU 11/7 No acute issues overnight while boarding in ED, remains on Precedex with BIPAP   Objective   Still encephalopathic, weaning off Precedex On nasal cannula, pending barium swallow.  I Put in a core track, start nutrition  Blood pressure 94/60, pulse 75, temperature 98.7 F (37.1 C), temperature source Axillary, resp. rate (!) 27, height 5' 2 (1.575 m), weight 61.8 kg, SpO2 96%.        Intake/Output Summary (Last 24 hours) at 02/12/2024 0740 Last data filed at 02/12/2024 0700 Gross per 24 hour  Intake 1434.87 ml  Output 2145 ml  Net -710.13 ml   Filed Weights   02/10/24 0500 02/11/24 0500 02/12/24 0500  Weight: 62.5 kg 62.3 kg 61.8 kg    Examination: General: Chronically appearing elderly male lying on bed HEENT: NCAT CVS: S1-S2 RRR, no murmur, rubs Pulm: Symmetrical chest wall movement, clear to auscultation on nasal cannula GI: Soft nontender nondistended Extremities: Warm and dry GU: Deferred   Assessment and Plan  [name] is a 89 year old male patient with a past medical history of remote lung cancer, recurrent aspiration, CAD status post CABG, diastolic heart failure with a EF 60 to 65% in June 2025 with also mild RV dysfunction, A-fib on Eliquis , left thalamic stroke, DNR/DNI presenting to [facility] with acute hypoxic respiratory failure.    Sepsis secondary to CAP vs aspiration PNA, POA -Presented with mildly elevated temp of 100.1,  tachypnea, and mild tachycardia in the setting of elevated lactic of 3.1 and leukocytosis with CT evidence of infection  P: Admit ICU Now off BiPAP and on nasal cannula Antibiotics narrowed down to ampicillin  Pressors for MAP < 65  Monitor urine output  Acute hypoxic respiratory failure secondary to community-acquired pneumonia versus aspiration pneumonia in the context of chronic interstitial lung disease and  remote history of lung cancer  P: Off BiPAP since 02/10/2024-on nasal cannula Head of bed elevated 30 degrees Follow intermittent chest x-ray and ABG adequate pulmonary hygiene  Follow cultures and  Diastolic heart failure  -EF 60 to 65% in June 2025 BNP 754 (around his baseline) CAD status post CABG P: Continuous telemetry  Hold home statin given NPO status  Strict intake and output  Daily weight to assess volume status Daily assessment for need to diurese Closely monitor renal function and electrolytes   AKI  -Creatinine 1.38 mg/dL from a baseline of 8.95 mg/dL likely prerenal azotemia in the setting of sepsis History of Pseudomonas UTI P: Follow renal function  Monitor urine output Trend Bmet Avoid nephrotoxins Ensure adequate renal perfusion   Metabolic encephalopathy secondary to the above History of left thalamic stroke P: Maintain neuro protective measures; goal for eurothermia, euglycemia, eunatermia, normoxia, and PCO2 goal of 35-40 Nutrition and bowel regimen Aspirations precautions   A-fib on reduced dose Eliquis  -due to low platelets, will for start with DVT prophylaxis P: Continuous telemetry  Hold home Eliquis    GOC DNR/I Spoke to family at bedside, patient is still encephalopathic, ICU delirium, per family patient will sometimes have similar events at home.  Of course this is worsening with infection.  Went into details regarding goals of care including quality of life.  At this time he wants a PEG tube for nutrition, if patient cannot tolerate p.o. he reports that he is not ready for any comfort measures, or hospice. Will bring on board palliative care team to also assist.  Will try to coordinate with palliative care team, and family today for more goals of care discussion.     Lenny Drought, MD  [facility] Prefer epic messenger for cross cover needs   My critical care time: 35 minutes  Critical care time was exclusive  of separately billable procedures and treating other patients.  Critical care was necessary to treat or prevent imminent or life-threatening deterioration.  Critical care was time spent personally by me on the following activities: development of treatment plan with patient and/or surrogate as well as nursing, discussions with consultants, evaluation of patient's response to treatment, examination of patient, obtaining history from patient or surrogate, ordering and performing treatments and interventions, ordering and review of laboratory studies, ordering and review of radiographic studies, pulse oximetry, re-evaluation of patient's condition and participation in multidisciplinary rounds.

## 2024-02-12 NOTE — Progress Notes (Signed)
 Modified Barium Swallow Study  Patient Details  Name: Evan [name] MRN: [id number] Date of Birth: [birth year]  Today's Date: 02/12/2024  Modified Barium Swallow completed.  Full report located under Chart Review in the Imaging Section.  History of Present Illness 89 yo male with PMH of remote lung cancer, recurrent aspiration, CAD s/p CABG, diastolic heart failure with EF 60-65% and mild RV dysfunction, A-fib on Eliquis , L thalamic CVA who presents to ED 11/6 with acute hypoxic respiratory failure. CT Chest shows slowly progressive consolidative disease at the RLL though when compared to more remote examinations, differential considerations include recurrent pna or aspiration vs neoplastic process. Pt has a history of dysphagia with multiple prior MBS indicating presbyphagia and intermittent aspiration of thin liquids. Recommendations vary between thin and nectar thick liquids, though most recent MBS 09/28/23 recommended continuing Dys 3 with thin liquids using rate control strategies and aspiration precautions. Aspiration was observed with variable sensation.   Clinical Impression Pt presents with a decline in function since most recent MBS June 2025. He now presents with profound oropharyngeal dysphagia secondary to both the chronicity and volume of aspiration as well as volume of pharyngeal residue (DIGEST score of 4). Despite complete epiglottic deflection, anterior hydoid burst and laryngeal elevation are incomplete. The swallow is consistently initiated at the pyriform sinuses and when the laryngeal vestibule is not completely closed, leads to aspiration before and during the swallow, increasing in volume throughout. Aspiration is generally sensed but pt's cough can only clear penetrates above the vocal folds (PAS 7, 8). Penetration/aspiration did not occur with purees or solids, though pharyngeal residuals were noted to continue accumulating on and past the vocal folds as he swallowed subsequent  boluses. Returned to pt's room after the study to discuss findings with pt's son, Arley, in addition to CCM and PMT. Continue NPO except ice chips, which are meant to provide comfort, promote pharyngeal hygiene, and preserve swallowing function in an NPO pt. Provide thorough oral care prior to offering ice and cue pt to cough following trials. Will continue following for ongoing education pending GOC.  Factors that may increase risk of adverse event in presence of aspiration [employer]ian[employer] 2021): Poor general health and/or compromised immunity;Reduced cognitive function;Limited mobility;Frail or deconditioned;Inadequate oral hygiene;Weak cough;Presence of tubes (ETT, trach, NG, etc.);Frequent aspiration of large volumes  Swallow Evaluation Recommendations Recommendations: NPO;Ice chips PRN after oral care Medication Administration: Via alternative means Oral care recommendations: Oral care QID (4x/day);Oral care before ice chips/water;Staff/trained caregiver to provide oral care Recommended consults: Consider Palliative care    Damien Blumenthal, M.A., CCC-SLP Speech Language Pathology, Acute Rehabilitation Services  Secure Chat preferred [phone]  02/12/2024,12:09 PM

## 2024-02-13 DIAGNOSIS — Z7189 Other specified counseling: Secondary | ICD-10-CM

## 2024-02-13 DIAGNOSIS — Z515 Encounter for palliative care: Secondary | ICD-10-CM

## 2024-02-13 DIAGNOSIS — R638 Other symptoms and signs concerning food and fluid intake: Secondary | ICD-10-CM

## 2024-02-13 DIAGNOSIS — N390 Urinary tract infection, site not specified: Secondary | ICD-10-CM

## 2024-02-13 DIAGNOSIS — N182 Chronic kidney disease, stage 2 (mild): Secondary | ICD-10-CM

## 2024-02-13 DIAGNOSIS — Z789 Other specified health status: Secondary | ICD-10-CM

## 2024-02-13 DIAGNOSIS — Z8673 Personal history of transient ischemic attack (TIA), and cerebral infarction without residual deficits: Secondary | ICD-10-CM

## 2024-02-13 DIAGNOSIS — Z711 Person with feared health complaint in whom no diagnosis is made: Secondary | ICD-10-CM

## 2024-02-13 DIAGNOSIS — R1310 Dysphagia, unspecified: Secondary | ICD-10-CM

## 2024-02-13 DIAGNOSIS — Z66 Do not resuscitate: Secondary | ICD-10-CM

## 2024-02-13 DIAGNOSIS — D696 Thrombocytopenia, unspecified: Secondary | ICD-10-CM

## 2024-02-13 DIAGNOSIS — J449 Chronic obstructive pulmonary disease, unspecified: Secondary | ICD-10-CM

## 2024-02-13 LAB — GLUCOSE, CAPILLARY
Glucose-Capillary: 121 mg/dL — ABNORMAL HIGH (ref 70–99)
Glucose-Capillary: 119 mg/dL — ABNORMAL HIGH (ref 70–99)
Glucose-Capillary: 123 mg/dL — ABNORMAL HIGH (ref 70–99)
Glucose-Capillary: 119 mg/dL — ABNORMAL HIGH (ref 70–99)
Glucose-Capillary: 146 mg/dL — ABNORMAL HIGH (ref 70–99)
Glucose-Capillary: 127 mg/dL — ABNORMAL HIGH (ref 70–99)
Glucose-Capillary: 123 mg/dL — ABNORMAL HIGH (ref 70–99)

## 2024-02-13 LAB — CBC
HCT: 29 % — ABNORMAL LOW (ref 39.0–52.0)
Hemoglobin: 9.1 g/dL — ABNORMAL LOW (ref 13.0–17.0)
MCH: 34.5 pg — ABNORMAL HIGH (ref 26.0–34.0)
MCHC: 31.4 g/dL (ref 30.0–36.0)
MCV: 109.8 fL — ABNORMAL HIGH (ref 80.0–100.0)
Platelets: 41 K/uL — ABNORMAL LOW (ref 150–400)
RBC: 2.64 MIL/uL — ABNORMAL LOW (ref 4.22–5.81)
RDW: 17.6 % — ABNORMAL HIGH (ref 11.5–15.5)
WBC: 4.9 K/uL (ref 4.0–10.5)
nRBC: 0 % (ref 0.0–0.2)

## 2024-02-13 LAB — PHOSPHORUS: Phosphorus: 3.1 mg/dL (ref 2.5–4.6)

## 2024-02-13 LAB — BASIC METABOLIC PANEL WITH GFR
Anion gap: 13 (ref 5–15)
BUN: 29 mg/dL — ABNORMAL HIGH (ref 8–23)
CO2: 20 mmol/L — ABNORMAL LOW (ref 22–32)
Calcium: 8.7 mg/dL — ABNORMAL LOW (ref 8.9–10.3)
Chloride: 112 mmol/L — ABNORMAL HIGH (ref 98–111)
Creatinine, Ser: 0.99 mg/dL (ref 0.61–1.24)
GFR, Estimated: >60 mL/min (ref >60)
Glucose, Bld: 119 mg/dL — ABNORMAL HIGH (ref 70–99)
Potassium: 4 mmol/L (ref 3.5–5.1)
Sodium: 145 mmol/L (ref 135–145)

## 2024-02-13 MED ORDER — METOPROLOL TARTRATE 12.5 MG HALF TABLET
12.5000 mg | ORAL_TABLET | Freq: Two times a day (BID) | ORAL | Status: DC
  Administered 2024-02-13 – 2024-02-16 (×6): 12.5 mg
  Filled 2024-02-13 (×7): qty 1

## 2024-02-13 MED ORDER — SENNOSIDES-DOCUSATE SODIUM 8.6-50 MG PO TABS
1.0000 | ORAL_TABLET | Freq: Two times a day (BID) | ORAL | Status: DC
  Administered 2024-02-13 – 2024-02-16 (×5): 1
  Filled 2024-02-13 (×5): qty 1

## 2024-02-13 MED ORDER — FLUOXETINE HCL 20 MG/5ML PO SOLN
20.0000 mg | Freq: Every day | ORAL | Status: DC
  Administered 2024-02-13 – 2024-02-16 (×4): 20 mg
  Filled 2024-02-13 (×4): qty 5

## 2024-02-13 MED ORDER — NITROFURANTOIN MONOHYD MACRO 100 MG PO CAPS: 100.0000 mg | ORAL_CAPSULE | Freq: Once | ORAL | Status: DC

## 2024-02-13 MED ORDER — POLYETHYLENE GLYCOL 3350 17 G PO PACK
17.0000 g | PACK | Freq: Every day | ORAL | Status: DC
  Administered 2024-02-15 – 2024-02-16 (×2): 17 g
  Filled 2024-02-13 (×2): qty 1

## 2024-02-13 MED ORDER — REVEFENACIN 175 MCG/3ML IN SOLN
175.0000 ug | Freq: Every day | RESPIRATORY_TRACT | Status: DC
  Administered 2024-02-13 – 2024-02-16 (×4): 175 ug via RESPIRATORY_TRACT
  Filled 2024-02-13 (×4): qty 3

## 2024-02-13 MED ORDER — PANTOPRAZOLE SODIUM 40 MG IV SOLR
40.0000 mg | Freq: Every day | INTRAVENOUS | Status: DC
  Administered 2024-02-13 – 2024-02-15 (×3): 40 mg via INTRAVENOUS
  Filled 2024-02-13 (×3): qty 10

## 2024-02-13 MED ORDER — BETHANECHOL CHLORIDE 5 MG PO TABS
10.0000 mg | ORAL_TABLET | Freq: Three times a day (TID) | ORAL | Status: DC
  Administered 2024-02-13 – 2024-02-16 (×10): 10 mg
  Filled 2024-02-13 (×4): qty 2
  Filled 2024-02-13: qty 1
  Filled 2024-02-13 (×3): qty 2
  Filled 2024-02-13: qty 1
  Filled 2024-02-13: qty 2

## 2024-02-13 MED ORDER — REVEFENACIN 175 MCG/3ML IN SOLN: 175.0000 ug | Freq: Every day | RESPIRATORY_TRACT | Status: DC

## 2024-02-13 MED ORDER — ARFORMOTEROL TARTRATE 15 MCG/2ML IN NEBU
15.0000 ug | INHALATION_SOLUTION | Freq: Two times a day (BID) | RESPIRATORY_TRACT | Status: DC
  Administered 2024-02-13 – 2024-02-16 (×6): 15 ug via RESPIRATORY_TRACT
  Filled 2024-02-13 (×7): qty 2

## 2024-02-13 NOTE — Progress Notes (Signed)
 Speech Language Pathology Treatment: Dysphagia  Patient Details Name: Evan [name] MRN: [id number] DOB: 07-01-1927 Today's Date: 02/13/2024 Time: 8641-8583 SLP Time Calculation (min) (ACUTE ONLY): 18 min  Assessment / Plan / Recommendation Clinical Impression  Continue current diet using recommended precautions as pt's daughter states her family accepts risk. Pt is unable to participate in therapeutic exercises and education with his family is complete. SLP will sign off acutely but please re-consult if needs arise.   Pt does not appear to demonstrate understanding of situation or swallowing precautions. He drinks large, uncontrolled sips that are followed by prolonged coughing. This does not improve with cueing to use rate control strategies. Modifying pt's diet will not prevent aspiration as it occurred with all liquids regardless of consistency, and thickened liquids are thought to be more harmful to pulmonary health if aspirated. Discussed minimizing risk of adverse events secondary to aspiration by prioritizing oral care prior to PO intake and ensuring pt sits upright.    HPI HPI: 89 yo male with PMH of remote lung cancer, recurrent aspiration, CAD s/p CABG, diastolic heart failure with EF 60-65% and mild RV dysfunction, A-fib on Eliquis , L thalamic CVA who presents to ED 11/6 with acute hypoxic respiratory failure. CT Chest shows slowly progressive consolidative disease at the RLL though when compared to more remote examinations, differential considerations include recurrent pna or aspiration vs neoplastic process. Pt has a history of dysphagia with multiple prior MBS indicating presbyphagia and intermittent aspiration of thin liquids. Recommendations vary between thin and nectar thick liquids, though most recent MBS 09/28/23 recommended continuing Dys 3 with thin liquids using rate control strategies and aspiration precautions. Aspiration was observed with variable sensation. MBS 02/12/24 showed  gross aspiration of all liquids. Family accepts risk of adverse reactions to aspiration and pt resumed regular diet with thin liquids 11/11.      SLP Plan  All goals met          Recommendations  Diet recommendations: Regular;Thin liquid Liquids provided via: Cup;Straw Medication Administration: Crushed with puree Supervision: Staff to assist with self feeding;Full supervision/cueing for compensatory strategies;Trained caregiver to feed patient Compensations: Minimize environmental distractions;Slow rate;Small sips/bites Postural Changes and/or Swallow Maneuvers: Seated upright 90 degrees                  Oral care BID;Oral care before and after PO;Staff/trained caregiver to provide oral care   Frequent or constant Supervision/Assistance Dysphagia, oropharyngeal phase (R13.12)     All goals met     Damien Blumenthal, M.A., CCC-SLP Speech Language Pathology, Acute Rehabilitation Services  Secure Chat preferred [phone]   02/13/2024, 2:40 PM

## 2024-02-13 NOTE — Progress Notes (Addendum)
 NAME:  Evan [name], MRN:  [id number], DOB:  04-08-1927, LOS: 5 ADMISSION DATE:  02/08/2024,  History of Present Illness:  Evan [name] is a 89 year old male patient with a past medical history of remote lung cancer, recurrent aspiration, CAD status post CABG, diastolic heart failure with a EF 60 to 65% in June 2025 with also mild RV dysfunction, A-fib on Eliquis , left thalamic stroke, DNR/DNI presenting today to [facility] with acute hypoxic respiratory failure.  History was taken mostly from chart and patient's son who was at bedside with him.  Appears that he has been having productive cough for the past 2 days and an episode of rigors today which led to the family calling EMS.  On arrival, EMS noted that he was hypoxic and therefore he was brought into the emergency department.  In the ED his work of breathing was noted to be increased and therefore he was placed on BiPAP.  Was not tolerating BiPAP and was trying to remove the mask and therefore he was placed on Precedex.  Labs with elevated white count at 11.1.  Neutrophilic predominance at 95.  AKI was noted with a creatinine of 1.38 mg/dL from a baseline of 8.95 mg/dL.  Lactic acid was 3.1.  CT chest without contrast 11/6 demonstrates an underlying chronic interstitial lung disease with peripheral reticular changes.  Small bilateral pleural effusions.  Chronic right lower lobe consolidative process that is increased.  Therefore he was started on broad-spectrum antibiotics and admitted to the ICU for further management.  Discussed with his son at bedside who was very understanding of the current situation and we agreed to admit him to the ICU overnight without any heroic measures.  And depending on the trajectory in the next 12 hours they will decide on continuing of care or comfort measures only.  Confirmed DNR/DNI at bedside with his son.  Pertinent  Medical History  As above  Significant Hospital Events: Including procedures,  antibiotic start and stop dates in addition to other pertinent events   02/08/2024 -admitted to the ICU 11/7 No acute issues overnight while boarding in ED, remains on Precedex with BIPAP  11/11: On nasal cannula.  Family okay with patient eating understanding the risks of aspiration.  Objective   He is alert.  Confused at baseline.  Hard of hearing.  Wanting to drink water.  Blood pressure 125/82, pulse 88, temperature 98.3 F (36.8 C), temperature source Axillary, resp. rate (!) 25, height 5' 2 (1.575 m), weight 60.4 kg, SpO2 95%.        Intake/Output Summary (Last 24 hours) at 02/13/2024 0730 Last data filed at 02/13/2024 0700 Gross per 24 hour  Intake 1018.23 ml  Output 1030 ml  Net -11.77 ml   Filed Weights   02/11/24 0500 02/12/24 0500 02/13/24 0500  Weight: 62.3 kg 61.8 kg 60.4 kg    Examination: General: Elderly male who appears weak and cachectic. HEENT: Dry mucous membrane. CVS: S1-S2 RRR, no murmur, rubs Pulm: Symmetrical chest wall movement, clear to auscultation on nasal cannula GI: Soft nontender nondistended Extremities: Warm and dry   Assessment and Plan  Evan [name] is a 89 year old male patient with a past medical history of remote lung cancer, recurrent aspiration, CAD status post CABG, diastolic heart failure with a EF 60 to 65% in June 2025 with also mild RV dysfunction, A-fib on Eliquis , left thalamic stroke, DNR/DNI presenting to [facility] with acute hypoxic respiratory failure.  Sepsis  Strep pneumoniae right lower  lobe pneumonia present on admission Amp C Kleb pneumoniae [30K colonies] and Enterococcus UTI [greater than 100 K] -Presented with mildly elevated temp of 100.1, tachypnea, and mild tachycardia in the setting of elevated lactic of 3.1 and leukocytosis with CT evidence of infection [right lower lobe pneumonia]  Now off BiPAP and on nasal cannula Antibiotics narrowed down to Unasyn  for bacteremia.  Suspected strep pneumoniae source  is pneumonia.  Will complete 10 days of antibiotics. Unasyn  will cover Enterococcus UTI.  Klebsiella pneumonia has been a colonizer for a long period Pressors for MAP < 65  Monitor urine output  Acute hypoxic respiratory failure secondary to community-acquired pneumonia versus aspiration pneumonia in the context of chronic interstitial lung disease and remote history of lung cancer  History of COPD Off BiPAP since 02/10/2024-on nasal cannula adequate pulmonary hygiene  Brovana and Yupelri while holding home inhalers.  Diastolic heart failure  -EF 60 to 65% with dilated RV and LA and reduced RV function in June 2025 BNP 754 (around his baseline) CAD status post CABG P: Continuous telemetry  Hold home statin  Resume home metop.   AKI on CKD 2 -Creatinine 1.38 mg/dL from a baseline of 8.95 mg/dL likely prerenal azotemia in the setting of sepsis - improving.   Metabolic encephalopathy secondary to the above History of left thalamic stroke P: Maintain neuro protective measures; goal for eurothermia, euglycemia, eunatermia, normoxia, and PCO2 goal of 35-40 Nutrition and bowel regimen Aspirations precautions   A-fib on reduced dose Eliquis  Chronic TCP -due to low platelets, will for start with DVT prophylaxis - hold home eliquis . May not resume it on dc.   Severe Malnutrition: - RD consulted.   DTPI on sacrum with partial thickness : - wound consult.   GOC DNR/I Pall care on board. Spoke to son who is POA. Patient is asking for water. Discussed that patient is high risk for aspiration. After conversation son agrees that he is ok with accepting the risk of aspiration and if patient detiorates further would transition to make the patient comfortable only.   Transfer out of ICU today.   Total care time: 52 minutes   Critical care was time spent personally by me on the following activities: development of treatment plan with patient and/or surrogate as well as nursing,  discussions with consultants, evaluation of patient's response to treatment, examination of patient, obtaining history from patient or surrogate, ordering and performing treatments and interventions, ordering and review of laboratory studies, ordering and review of radiographic studies, pulse oximetry, re-evaluation of patient's condition and participation in multidisciplinary rounds.  Sammi JONETTA Fredericks, MD Pulmonary, Critical Care and Sleep Attending.  Pager: [phone]  02/13/2024, 7:33 AM

## 2024-02-13 NOTE — Progress Notes (Signed)
 Patient transferred to 6N07. Patient in floor bed, cleaned, new wound dressing, tube feeds running, foley in place, and placed to 4L O2 wall oxygen . Bed locked and in lowest position with rails up and family at bedside who helped transfer patient belongings. RN at bedside received patient.

## 2024-02-13 NOTE — Progress Notes (Signed)
 Daily Progress Note   Patient Name: Evan [name]       Date: 02/13/2024 DOB: [birth year]  Age: 89 y.o. MRN#: [id number] Attending Physician: Theodoro Lakes, MD Primary Care Physician: [name], MD Admit Date: 02/08/2024  Reason for Consultation/Follow-up: Establishing goals of care  Subjective: I have reviewed medical records including EPIC notes, MAR, no available advanced directives noted in EHR, and labs. Noted that Dr. Theodoro spoke with patient's family - diet has been liberalized with known risk of aspiration - if patient declines, transition to full comfort. Received report from primary RN - no acute concerns. Per RN, patient will be transferred out of ICU today.  Went to visit patient at bedside - daughter/[name] [name] present. Patient is lying in bed awake, alert, pleasantly oriented to self only, unable to participate in complex decision making. No signs or non-verbal gestures of pain or discomfort noted. No respiratory distress, increased work of breathing, or secretions noted. He denies pain. States he feels alright. Coretrak in use.  Confirmed plans for family meeting. Evan [name] questions what will be discussed - outlined importance of ongoing disscussion regarding dysphagia, dementia trajectory, comfort vs artificial feeding, comfort care/hospice. Family meeting scheduled for tomorrow at 12p.  According to Evan [name], she is not aware of any HCPOA documents. Patient had a bunch of things filled out several years ago but she does not remember what it was. Explained that decisions would fall to majority of available children - she expresses understanding and feels they are all on the same page.  Patient has 2 daughters and 2 sons - one daughter is not involved in his caregiving.  All  questions and concerns addressed. Encouraged to call with questions and/or concerns. PMT card provided.  Length of Stay: 5  Current Medications: Scheduled Meds:   arformoterol  15 mcg Nebulization BID   bethanechol  10 mg Per Tube TID   Chlorhexidine  Gluconate Cloth  6 each Topical Daily   feeding supplement (PROSource TF20)  60 mL Per Tube Daily   FLUoxetine   20 mg Per Tube Daily   heparin  injection (subcutaneous)  5,000 Units Subcutaneous Q12H   melatonin  3 mg Oral QHS   metoprolol  tartrate  12.5 mg Per Tube BID   mupirocin  ointment   Nasal BID   mouth rinse  15 mL  Mouth Rinse 4 times per day   pantoprazole  (PROTONIX ) IV  40 mg Intravenous QHS   polyethylene glycol  17 g Per Tube Daily   revefenacin  175 mcg Nebulization Daily   senna-docusate  1 tablet Per Tube BID   thiamine  100 mg Per Tube Daily    Continuous Infusions:  ampicillin  (OMNIPEN) IV Stopped (02/13/24 0743)   feeding supplement (OSMOLITE 1.2 CAL) 35 mL/hr at 02/13/24 1300    PRN Meds: acetaminophen , docusate sodium , hydrOXYzine , mouth rinse, polyethylene glycol  Physical Exam Vitals and nursing note reviewed.  Constitutional:      General: He is not in acute distress.    Appearance: He is ill-appearing.  Pulmonary:     Effort: No respiratory distress.  Skin:    General: Skin is warm and dry.  Neurological:     Mental Status: He is alert. Mental status is at baseline. He is disoriented.     Motor: Weakness present.  Psychiatric:        Attention and Perception: Attention normal.        Behavior: Behavior is cooperative.        Cognition and Memory: Cognition is impaired. Memory is impaired.             Vital Signs: BP 114/73   Pulse 78   Temp 99.3 F (37.4 C) (Axillary)   Resp (!) 28   Ht 5' 2 (1.575 m)   Wt 60.4 kg   SpO2 93%   BMI 24.35 kg/m  SpO2: SpO2: 93 % O2 Device: O2 Device: Nasal Cannula O2 Flow Rate: O2 Flow Rate (L/min): 4 L/min  Intake/output summary:  Intake/Output  Summary (Last 24 hours) at 02/13/2024 1440 Last data filed at 02/13/2024 1300 Gross per 24 hour  Intake 1264.63 ml  Output 1405 ml  Net -140.37 ml   LBM: Last BM Date : 02/12/24 Baseline Weight: Weight: 63.5 kg Most recent weight: Weight: 60.4 kg       Palliative Assessment/Data: PPS 30%      Patient Active Problem List   Diagnosis Date Noted   Palliative care by specialist 02/12/2024   Acute hypoxic respiratory failure (HCC) 02/08/2024   High risk medications (not anticoagulants) long-term use 12/08/2023   Pain and swelling of right wrist 10/19/2023   CVA (cerebral vascular accident) (HCC) 09/22/2023   Sepsis (HCC) 09/10/2023   Aspiration pneumonia (HCC) 09/09/2023   Hypovolemia 08/24/2023   Cellulitis of upper extremity 08/23/2023   Bilateral pneumonia 08/17/2023   Acute hypoxemic respiratory failure (HCC) 08/13/2023   Chronic systolic heart failure (HCC) 08/10/2023   Acute metabolic acidosis 03/13/2023   Chronic indwelling Foley catheter 03/12/2023   Anemia 09/27/2022   Gastro-esophageal reflux disease without esophagitis 05/17/2022   Hyperlipidemia, unspecified 05/17/2022   Personal history of other venous thrombosis and embolism 05/17/2022   CKD stage 3b, GFR 30-44 ml/min (HCC) - baseline SCr 1.2-1.5 05/17/2022   Presence of coronary angioplasty implant and graft 05/17/2022   Malignant neoplasm of unspecified part of right bronchus or lung (HCC) 05/17/2022   Dementia without behavioral disturbance (HCC) 05/17/2022   Acute on chronic diastolic CHF (congestive heart failure) (HCC) 05/12/2022   Protein-calorie malnutrition, severe 05/12/2022   Chronic respiratory failure, unsp w hypoxia or hypercapnia (HCC) 04/28/2022   History of pulmonary embolism 04/28/2022   Hemiparesis due to old stroke (HCC) 06/25/2021   Mild aortic stenosis 06/23/2021   Sundowning 07/04/2019   Bilateral hearing loss 04/16/2019   Closed wedge fracture of lumbar vertebra (  HCC) 03/14/2019    Pressure ulcer of back 08/27/2018   COPD (chronic obstructive pulmonary disease) (HCC) 08/26/2018   Prolonged Q-T interval on ECG    Dysphagia, post-stroke    Hyponatremia    History of lung cancer    Depression    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   Thrombocytopenia 02/02/2018   Hypokalemia 02/02/2018   Old MI (myocardial infarction) 06/27/2017   HFmrEF (heart failure with mildly reduced ejection fraction) (HCC) 06/26/2017   Oral candidiasis 05/15/2017   Radiation pneumonitis 11/09/2016   Rheumatoid arthritis, unspecified (HCC) 10/11/2016   Subdural hematoma (HCC) 09/13/2016   Permanent atrial fibrillation (HCC) 05/21/2016   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   GAD (generalized anxiety disorder) 11/20/2015   Esophageal ulcer without bleeding    Bilateral lower extremity edema 04/28/2014   CAD (coronary artery disease) 09/09/2013   Benign prostatic hyperplasia without lower urinary tract symptoms 10/08/2012   EKG abnormalities 09/05/2012   Tremor 09/05/2012   Chronic fatigue 09/05/2012   Arthritis    Asthma, chronic    Reflux esophagitis    S/P CABG (coronary artery bypass graft)    Essential hypertension    UNSPECIFIED PERIPHERAL VASCULAR DISEASE 06/16/2009    Palliative Care Assessment & Plan   Patient Profile: 89 y.o. male  with past medical history of dementia, remote lung cancer, ILD, recurrent aspiration, CAD status post CABG, diastolic heart failure with a EF 60 to 65% in June 2025 with also mild RV dysfunction, A-fib on Eliquis , left thalamic stroke admitted on 02/08/2024 with dyspnea.    Family called EMS due to concern for his respiratory status and he was found to be hypoxic. He was found to have lactic acidosis and AKI with elevated creatinine 1.38. CT chest without contrast 11/6 demonstrates an underlying chronic interstitial lung disease with peripheral reticular changes. Small bilateral pleural effusions.  Chronic right lower lobe consolidative process that is increased. He was ultimately admitted for acute hypoxic respiratory failure secondary to CAP vs aspiration PNA and ILD, metabolic encephalopathy. Required BiPAP and precedex, weaning off and on [place] at time of my evaluation.   PMT has been consulted to assist with goals of care conversation. Patient is family to palliative service from 2024 and 2020 hospitalizations.   Assessment: Principal Problem:   Acute hypoxic respiratory failure (HCC) Active Problems:   Palliative care by specialist   Concern about end of life  Recommendations/Plan: Continue current plan of care Diet liberalized today with known risk of aspiration - if declines, plan is for transition to full comfort Family meeting scheduled for tomorrow 11/12 at 12p PMT will continue to follow and support holistically  Goals of Care and Additional Recommendations: Limitations on Scope of Treatment: No Tracheostomy  Code Status:    Code Status Orders  (From admission, onward)           Start     Ordered   02/08/24 2337  Do not attempt resuscitation (DNR)- Limited -Do Not Intubate (DNI)  Continuous       Question Answer Comment  If pulseless and not breathing No CPR or chest compressions.   In Pre-Arrest Conditions (Patient Is Breathing and Has A Pulse) Do not intubate. Provide all appropriate non-invasive medical interventions. Avoid ICU transfer unless indicated or required.   Consent: Discussion documented in EHR or advanced directives reviewed      02/08/24 2337           Code Status History  Date Active Date Inactive Code Status Order ID Comments User Context   02/08/2024 1924 02/08/2024 2337 Limited: Do not attempt resuscitation (DNR) -DNR-LIMITED -Do Not Intubate/DNI  [id number]  Yolande Lamar BROCKS, MD ED   09/23/2023 0901 09/26/2023 1723 Limited: Do not attempt resuscitation (DNR) -DNR-LIMITED -Do Not Intubate/DNI  [id number]  Trixie Nilda HERO, MD  Inpatient   09/09/2023 2249 09/15/2023 2035 Limited: Do not attempt resuscitation (DNR) -DNR-LIMITED -Do Not Intubate/DNI  [id number]  Alfornia Madison, MD ED   08/24/2023 0048 08/24/2023 2345 Limited: Do not attempt resuscitation (DNR) -DNR-LIMITED -Do Not Intubate/DNI  [id number]  Keturah Carrier, MD ED   08/23/2023 2132 08/24/2023 0048 Full Code [id number]  Keturah Carrier, MD ED   08/13/2023 1541 08/21/2023 1933 Full Code [id number]  Seena Marsa NOVAK, MD ED   03/08/2023 1556 03/13/2023 2307 Full Code [id number]  Seena Marsa NOVAK, MD ED   09/27/2022 2058 09/29/2022 1746 Full Code [id number]  Silvester Ales, MD ED   05/12/2022 0328 05/17/2022 2012 Full Code [id number]  Barbarann Nest, MD ED   04/28/2022 0828 05/03/2022 2319 Full Code [id number]  Claudene Maximino LABOR, MD ED   03/12/2022 1409 03/15/2022 1753 Full Code [id number]  Seena Marsa NOVAK, MD ED   08/26/2018 1834 08/27/2018 1702 DNR [id number]  Barbarann Nest, MD ED   08/21/2018 2140 08/24/2018 1914 Full Code [id number]  Tobie Jorie SAUNDERS, MD ED   07/16/2018 1506 08/15/2018 1355 Full Code [id number]  Maurice Sharlet RAMAN, PA-C Inpatient   07/14/2018 0658 07/16/2018 1447 Full Code [id number]  Voncile Isles, MD ED   06/01/2018 1426 06/04/2018 1607 DNR [id number]  Barbarann Nest, MD ED   02/02/2018 1246 02/03/2018 1756 Full Code [id number]  Silvester Ales, MD ED   06/26/2017 1558 06/28/2017 1503 Full Code [id number]  Lovey Satterfield, MD ED   12/20/2016 1932 12/23/2016 2239 Full Code [id number]  Marylu Leita SAUNDERS, NP ED   05/21/2016 0351 05/23/2016 1859 Full Code [id number]  Charlton Evalene RAMAN, MD ED   09/04/2015 1521 09/10/2015 1534 Full Code [id number]  Dina Camie BRAVO, PA-C ED       Prognosis:  poor  Discharge Planning: To Be Determined  Care plan was discussed with primary RN, patient, patient's daughter, Dr. Theodoro  Thank you for allowing the Palliative Medicine Team to assist in the care of this patient.   Total Time 35 minutes Prolonged Time Billed  no       Jeoffrey HERO Claudene, NP  Please contact Palliative Medicine Team phone at [phone] for questions and concerns.   *Portions of this note are a verbal dictation therefore any spelling and/or grammatical errors are due to the Dragon [employer] One system interpretation.

## 2024-02-14 ENCOUNTER — Inpatient Hospital Stay (HOSPITAL_COMMUNITY)

## 2024-02-14 DIAGNOSIS — R7881 Bacteremia: Secondary | ICD-10-CM | POA: Diagnosis not present

## 2024-02-14 DIAGNOSIS — R531 Weakness: Secondary | ICD-10-CM

## 2024-02-14 DIAGNOSIS — J9601 Acute respiratory failure with hypoxia: Secondary | ICD-10-CM

## 2024-02-14 LAB — GLUCOSE, CAPILLARY
Glucose-Capillary: 109 mg/dL — ABNORMAL HIGH (ref 70–99)
Glucose-Capillary: 117 mg/dL — ABNORMAL HIGH (ref 70–99)
Glucose-Capillary: 120 mg/dL — ABNORMAL HIGH (ref 70–99)
Glucose-Capillary: 129 mg/dL — ABNORMAL HIGH (ref 70–99)
Glucose-Capillary: 137 mg/dL — ABNORMAL HIGH (ref 70–99)
Glucose-Capillary: 138 mg/dL — ABNORMAL HIGH (ref 70–99)
Glucose-Capillary: 139 mg/dL — ABNORMAL HIGH (ref 70–99)

## 2024-02-14 LAB — ECHOCARDIOGRAM COMPLETE
AR max vel: 0.97 cm2
AV Area VTI: 1.08 cm2
AV Area mean vel: 0.99 cm2
AV Mean grad: 6 mmHg
AV Peak grad: 11.8 mmHg
Ao pk vel: 1.72 m/s
Area-P 1/2: 7.16 cm2
Height: 62 in
S' Lateral: 2.7 cm
Weight: 2130.53 [oz_av]

## 2024-02-14 LAB — PHOSPHORUS: Phosphorus: 2.7 mg/dL (ref 2.5–4.6)

## 2024-02-14 MED ORDER — FUROSEMIDE 10 MG/ML IJ SOLN
40.0000 mg | Freq: Once | INTRAMUSCULAR | Status: AC
  Administered 2024-02-14: 40 mg via INTRAVENOUS
  Filled 2024-02-14: qty 4

## 2024-02-14 MED ORDER — MELATONIN 5 MG PO TABS
5.0000 mg | ORAL_TABLET | Freq: Every day | ORAL | Status: DC
  Administered 2024-02-14 – 2024-02-15 (×2): 5 mg via ORAL
  Filled 2024-02-14 (×2): qty 1

## 2024-02-14 MED ORDER — ALPRAZOLAM 0.5 MG PO TABS
0.5000 mg | ORAL_TABLET | Freq: Two times a day (BID) | ORAL | Status: DC | PRN
  Administered 2024-02-14 – 2024-02-15 (×2): 0.5 mg via ORAL
  Filled 2024-02-14 (×2): qty 1

## 2024-02-14 MED ORDER — FUROSEMIDE 40 MG PO TABS: 40.0000 mg | ORAL_TABLET | Freq: Every day | ORAL | Status: DC

## 2024-02-14 MED ORDER — COLLAGENASE 250 UNIT/GM EX OINT
TOPICAL_OINTMENT | Freq: Every day | CUTANEOUS | Status: DC
  Filled 2024-02-14: qty 30

## 2024-02-14 NOTE — Plan of Care (Signed)
  Problem: Nutrition: Goal: Adequate nutrition will be maintained Outcome: Progressing   Problem: Elimination: Goal: Will not experience complications related to bowel motility Outcome: Progressing Goal: Will not experience complications related to urinary retention Outcome: Progressing   Problem: Pain Managment: Goal: General experience of comfort will improve and/or be controlled Outcome: Progressing   Problem: Nutritional: Goal: Maintenance of adequate nutrition will improve Outcome: Progressing

## 2024-02-14 NOTE — Care Management Important Message (Signed)
 Important Message  Patient Details  Name: Evan [name] MRN: [id number] Date of Birth: 1927-08-01   Important Message Given:  Yes - Medicare IM     [place] SHAUNNA Cum[place], LCSW 02/14/2024, 9:56 AM

## 2024-02-14 NOTE — Progress Notes (Signed)
 Nutrition Follow-up  DOCUMENTATION CODES:   Severe malnutrition in context of chronic illness  INTERVENTION:  Continue tube feeding via Cortrak: Osmolite 1.2 at 45 ml/h (1080 ml per day) Prosource TF20 60 ml once daily   Provides 1376 kcal, 80 gm protein, 886 ml free water daily   Monitor magnesium  and phosphorus daily x 4 occurrences, MD to replete as needed, as pt is at risk for refeeding syndrome given severe malnutrition.   Continue thiamine 100mg  daily x7 days   NUTRITION DIAGNOSIS:   Severe Malnutrition related to chronic illness (HF, dysphagia) as evidenced by severe fat depletion, severe muscle depletion. - Ongoing   GOAL:   Patient will meet greater than or equal to 90% of their needs - Meeting via TF  MONITOR:   Diet advancement, Labs, Weight trends, TF tolerance  REASON FOR ASSESSMENT:   Consult Enteral/tube feeding initiation and management  ASSESSMENT:   Pt admitted with acute hypoxic respiratory failure. PMH significant for lung cancer, recurrent aspiration, CAD s/p CABG, diastolic heart failure, afib, left thalamic stroke.  11/6: admitted; CT chest without contract shows chronic ILD with peripheral reticular changes; small bilateral pleural effusions 11/10: MBS- now presents with profound oropharyngeal dysphagia secondary to both the chronicity and volume of aspiration as well as volume of pharyngeal residue; continue NPO except ice chips; Cortrak placed 11/11 - Diet advanced to regular, thin liquids with family knowing aspiration risk.   Received consult for assessment, please see note on 11/10 for full assessment. Pt tolerating tube feeds without any N/V or abdominal pain.  Having bowel movements. X2 yesterday. Ongoing GOC. Advise against PEG. Dr. Theodoro spoke with patient's family - diet has been liberalized with known risk of aspiration - if patient declines, transition to full comfort. Pt eating minimally.  Follow along for GOC.   Admit weight: 63.5  kg Current weight: 60.4 kg   Average Meal Intake: 11/11: 75% intake x 1 recorded meals  Nutritionally Relevant Medications: Scheduled Meds:  feeding supplement (PROSource TF20)  60 mL Per Tube Daily   senna-docusate  1 tablet Per Tube BID   thiamine  100 mg Per Tube Daily   Continuous Infusions:  ampicillin  (OMNIPEN) IV 2 g (02/14/24 0945)   feeding supplement (OSMOLITE 1.2 CAL) 45 mL/hr at 02/13/24 1717   Labs Reviewed: BUN 28 CBG ranges from 119-146 mg/dL over the last 24 hours HgbA1c 5.7  Diet Order:   Diet Order             Diet regular Room service appropriate? Yes; Fluid consistency: Thin  Diet effective now                   EDUCATION NEEDS:   Not appropriate for education at this time  Skin:  Skin Assessment: Skin Integrity Issues: Skin Integrity Issues:: Stage III DTI: sacrum Stage III: Sacrum  Last BM:  11/9 type 6 medium  Height:   Ht Readings from Last 1 Encounters:  02/08/24 5' 2 (1.575 m)    Weight:   Wt Readings from Last 1 Encounters:  02/13/24 60.4 kg    BMI:  Body mass index is 24.35 kg/m.  Estimated Nutritional Needs:   Kcal:  1300-1500  Protein:  75-85g  Fluid:  >/=1.5L   Olivia Kenning, RD Registered Dietitian  See Amion for more information

## 2024-02-14 NOTE — Progress Notes (Signed)
 Daily Progress Note   Patient Name: Evan [name]       Date: 02/14/2024 DOB: 12-06-1927  Age: 89 y.o. MRN#: [id number] Attending Physician: Arlice Reichert, MD Primary Care Physician: [name], MD Admit Date: 02/08/2024  Reason for Consultation/Follow-up: Establishing goals of care  Subjective: I have reviewed medical records including EPIC notes: hospitalist, nursing, TOC, SLP, critical care, dietitian, previous PMT; MAR; and labs including WBC 4.9 to assess infection as well as albumin of 2.4 assessed for overall health, nutritional status, and disease severity, helping predict prognosis and guide care. No available advanced directives noted in EHR.  12:00 PM Went to visit patient at bedside for scheduled family meeting-daughter/Evan [name], daughter-in-law/Evan [name], son/Evan [name], son-in-law/Evan [name], daughter/Evan [name] present in person.  Patient was lying in bed awake, alert, and pleasantly oriented to self only.  He is not able to make complex medical decisions.  Patient repeatedly asks for his lunch tray. No signs or non-verbal gestures of pain or discomfort noted. No respiratory distress, increased work of breathing, or secretions noted.  Patient denies pain or shortness of breath.  Lunch tray was delivered during visit -assisted in getting set up for meal.  Coughing noted with all intake -liquids and solids.  --------------------------------------------------------------------------------------------  Advance Care Planning Conversation   Pertinent diagnoses: Acute hypoxic respiratory failure secondary to aspiration pneumonia, ILD, recurrent aspiration  The patient and/or family consented to a voluntary Advance Care Planning conversation in person. Individuals present for the conversation:   daughter/Evan [name], daughter-in-law/Evan [name], son/Evan [name], son-in-law/Evan [name], daughter/Evan [name] to discuss diagnosis, prognosis, GOC, EOL wishes, disposition, and options.  Summary of the conversation:   I introduced Palliative Medicine as specialized medical care for people living with serious illness. It focuses on providing relief from the symptoms and stress of a serious illness. The goal is to improve quality of life for both the patient and the family.  We discussed a brief life review of the patient as well as functional and nutritional status.  Prior to hospitalization, patient was living in his own home where children took shifts providing 24/7 supervision/assistance.  We discussed patient's current illness and what it means in the larger context of patient's on-going co-morbidities.  Reviewed patient's interval history since admission per family request.  Natural disease trajectory and expectations at EOL were discussed. I attempted to elicit values and goals of  care important to the patient. The difference between aggressive medical intervention and comfort care was considered in light of the patient's goals of care.   Concept of human mortality and the limitations of medical interventions to prolong quality of life when the body fails to thrive was explored.  We reviewed patient's 5 hospital admissions over the past 6 months and recurrent aspiration in detail.  Reviewed with family that patient is at high risk for ongoing hospitalization's, aspiration, infections.  Discussed thoughts and feelings on continuing cycle of hospitalizations versus transitioning to a more comfort focused approach.  Provided education and counseling at length on the philosophy and benefits of hospice care. Discussed that it offers a holistic approach to care in the setting of end-stage illness, and is about supporting the patient where they are allowing nature to take it's course. Discussed the hospice team includes RNs,  physicians, social workers, and chaplains. They can provide personal care, support for the family, and help keep patient out of the hospital as well as assist with DME needs for home hospice. Education provided on the difference between home vs residential hospice.   Artificial feeding and hydration vs comfort feeds were considered and discussed. Family agree with continuing to allow patient oral intake with known risk for aspiration - they would not want to consider long term feeding tube.   Allowed space and time for family to reflect and discuss the information as noted above.  After discussion, family have opted for patient's discharge home with hospice, requesting [facility].  Family indicate they can continue to provide 24/7 supervision and assistance.  Family/patient have no DME needs.  We talked about transition to comfort measures in house and what that would entail inclusive of medications to control pain, dyspnea, agitation, nausea, and itching. We discussed stopping all unnecessary measures such as blood draws, needle sticks, oxygen , antibiotics, CBGs/insulin, cardiac monitoring, IVF, and frequent vital signs. Education provided that other non-pharmacological interventions would be utilized for holistic support and comfort such as spiritual support if requested, repositioning, music therapy, offering comfort feeds, and/or therapeutic listening. All care would focus on how the patient is looking and feeling. Family do not want transition to full comfort at this time -they would like patient medically optimized prior to discharge. They want to continue tube feeds until discharge with understanding that coretrak would be removed prior to discharge.  Visit also consisted of discussions dealing with the complex and emotionally intense issues of symptom management and palliative care in the setting of serious and potentially life-threatening illness.   Discussed with patient/family the importance of  continued conversation with each other and the medical providers regarding overall plan of care and treatment options, ensuring decisions are within the context of the patient's values and GOCs.    Questions and concerns were addressed. The patient/family was encouraged to call with questions and/or concerns. PMT card was provided.  Hard Choices book provided to family.  Outcome of the conversation: - Home hospice referral - Medically optimized prior to discharge - Continue comfort feeds with known risk for aspiration  I spent 60 minutes providing separately identifiable ACP services with the patient and/or surrogate decision maker in a voluntary, in-person conversation discussing the patient's wishes and goals as detailed in the above note.   Length of Stay: 6  Current Medications: Scheduled Meds:   arformoterol  15 mcg Nebulization BID   bethanechol  10 mg Per Tube TID   Chlorhexidine  Gluconate Cloth  6 each Topical Daily  collagenase    Topical Daily   feeding supplement (PROSource TF20)  60 mL Per Tube Daily   FLUoxetine   20 mg Per Tube Daily   heparin  injection (subcutaneous)  5,000 Units Subcutaneous Q12H   melatonin  5 mg Oral QHS   metoprolol  tartrate  12.5 mg Per Tube BID   mupirocin  ointment   Nasal BID   mouth rinse  15 mL Mouth Rinse 4 times per day   pantoprazole  (PROTONIX ) IV  40 mg Intravenous QHS   polyethylene glycol  17 g Per Tube Daily   revefenacin  175 mcg Nebulization Daily   senna-docusate  1 tablet Per Tube BID   thiamine  100 mg Per Tube Daily    Continuous Infusions:  ampicillin  (OMNIPEN) IV 2 g (02/14/24 0945)   feeding supplement (OSMOLITE 1.2 CAL) 45 mL/hr at 02/13/24 1717    PRN Meds: acetaminophen , ALPRAZolam , docusate sodium , hydrOXYzine , mouth rinse, polyethylene glycol  Physical Exam Vitals and nursing note reviewed.  Constitutional:      General: He is not in acute distress.    Appearance: He is ill-appearing.  Pulmonary:      Effort: No respiratory distress.  Skin:    General: Skin is warm and dry.  Neurological:     Mental Status: He is alert. Mental status is at baseline. He is disoriented.     Motor: Weakness present.  Psychiatric:        Behavior: Behavior is cooperative.        Cognition and Memory: Cognition is impaired. Memory is impaired.        Judgment: Judgment is impulsive.             Vital Signs: BP 108/60   Pulse 77   Temp 98.1 F (36.7 C) (Axillary)   Resp (!) 22   Ht 5' 2 (1.575 m)   Wt 60.4 kg   SpO2 100%   BMI 24.35 kg/m  SpO2: SpO2: 100 % O2 Device: O2 Device: Nasal Cannula O2 Flow Rate: O2 Flow Rate (L/min): 3 L/min  Intake/output summary:  Intake/Output Summary (Last 24 hours) at 02/14/2024 1200 Last data filed at 02/14/2024 9472 Gross per 24 hour  Intake 1398.17 ml  Output 645 ml  Net 753.17 ml   LBM: Last BM Date : 02/14/24 Baseline Weight: Weight: 63.5 kg Most recent weight: Weight: 60.4 kg       Palliative Assessment/Data: PPS 30%      Patient Active Problem List   Diagnosis Date Noted   Palliative care by specialist 02/12/2024   Acute hypoxic respiratory failure (HCC) 02/08/2024   High risk medications (not anticoagulants) long-term use 12/08/2023   Pain and swelling of right wrist 10/19/2023   CVA (cerebral vascular accident) (HCC) 09/22/2023   Sepsis (HCC) 09/10/2023   Aspiration pneumonia (HCC) 09/09/2023   Hypovolemia 08/24/2023   Cellulitis of upper extremity 08/23/2023   Bilateral pneumonia 08/17/2023   Acute hypoxemic respiratory failure (HCC) 08/13/2023   Chronic systolic heart failure (HCC) 08/10/2023   Acute metabolic acidosis 03/13/2023   Chronic indwelling Foley catheter 03/12/2023   Anemia 09/27/2022   Gastro-esophageal reflux disease without esophagitis 05/17/2022   Hyperlipidemia, unspecified 05/17/2022   Personal history of other venous thrombosis and embolism 05/17/2022   CKD stage 3b, GFR 30-44 ml/min (HCC) - baseline SCr  1.2-1.5 05/17/2022   Presence of coronary angioplasty implant and graft 05/17/2022   Malignant neoplasm of unspecified part of right bronchus or lung (HCC) 05/17/2022   Dementia without behavioral disturbance (HCC)  05/17/2022   Acute on chronic diastolic CHF (congestive heart failure) (HCC) 05/12/2022   Protein-calorie malnutrition, severe 05/12/2022   Chronic respiratory failure, unsp w hypoxia or hypercapnia (HCC) 04/28/2022   History of pulmonary embolism 04/28/2022   Hemiparesis due to old stroke (HCC) 06/25/2021   Mild aortic stenosis 06/23/2021   Sundowning 07/04/2019   Bilateral hearing loss 04/16/2019   Closed wedge fracture of lumbar vertebra (HCC) 03/14/2019   Pressure ulcer of back 08/27/2018   COPD (chronic obstructive pulmonary disease) (HCC) 08/26/2018   Prolonged Q-T interval on ECG    Dysphagia, post-stroke    Hyponatremia    History of lung cancer    Depression    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   Thrombocytopenia 02/02/2018   Hypokalemia 02/02/2018   Old MI (myocardial infarction) 06/27/2017   HFmrEF (heart failure with mildly reduced ejection fraction) (HCC) 06/26/2017   Oral candidiasis 05/15/2017   Radiation pneumonitis 11/09/2016   Rheumatoid arthritis, unspecified (HCC) 10/11/2016   Subdural hematoma (HCC) 09/13/2016   Permanent atrial fibrillation (HCC) 05/21/2016   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   GAD (generalized anxiety disorder) 11/20/2015   Esophageal ulcer without bleeding    Bilateral lower extremity edema 04/28/2014   CAD (coronary artery disease) 09/09/2013   Benign prostatic hyperplasia without lower urinary tract symptoms 10/08/2012   EKG abnormalities 09/05/2012   Tremor 09/05/2012   Chronic fatigue 09/05/2012   Arthritis    Asthma, chronic    Reflux esophagitis    S/P CABG (coronary artery bypass graft)    Essential hypertension    UNSPECIFIED PERIPHERAL VASCULAR DISEASE  06/16/2009    Palliative Care Assessment & Plan   Patient Profile: 89 y.o. male  with past medical history of dementia, remote lung cancer, ILD, recurrent aspiration, CAD status post CABG, diastolic heart failure with a EF 60 to 65% in June 2025 with also mild RV dysfunction, A-fib on Eliquis , left thalamic stroke admitted on 02/08/2024 with dyspnea.    Family called EMS due to concern for his respiratory status and he was found to be hypoxic. He was found to have lactic acidosis and AKI with elevated creatinine 1.38. CT chest without contrast 11/6 demonstrates an underlying chronic interstitial lung disease with peripheral reticular changes. Small bilateral pleural effusions. Chronic right lower lobe consolidative process that is increased. He was ultimately admitted for acute hypoxic respiratory failure secondary to CAP vs aspiration PNA and ILD, metabolic encephalopathy. Required BiPAP and precedex, weaning off and on [place] at time of my evaluation.   PMT has been consulted to assist with goals of care conversation. Patient is family to palliative service from 2024 and 2020 hospitalizations.   Assessment: Principal Problem:   Acute hypoxic respiratory failure (HCC) Active Problems:   Palliative care by specialist   Concern about end of life  Recommendations/Plan: Continue current plan of care Continue DNR/DNI as previously documented Goal is for discharge home with hospice after medical optimization -TOC and hospice liaison notified; TOC consult placed Continue coretrak until discharge.  No long-term feeding tube Continue comfort feeds with known risk of aspiration Aspiration precautions PMT will continue to follow and support holistically  Goals of Care and Additional Recommendations: Limitations on Scope of Treatment: No Tracheostomy  Code Status:    Code Status Orders  (From admission, onward)           Start     Ordered   02/08/24 2337  Do not attempt resuscitation (DNR)-  Limited -Do Not Intubate (DNI)  Continuous       Question Answer Comment  If pulseless and not breathing No CPR or chest compressions.   In Pre-Arrest Conditions (Patient Is Breathing and Has A Pulse) Do not intubate. Provide all appropriate non-invasive medical interventions. Avoid ICU transfer unless indicated or required.   Consent: Discussion documented in EHR or advanced directives reviewed      02/08/24 2337           Code Status History     Date Active Date Inactive Code Status Order ID Comments User Context   02/08/2024 1924 02/08/2024 2337 Limited: Do not attempt resuscitation (DNR) -DNR-LIMITED -Do Not Intubate/DNI  [id number]  Yolande Lamar BROCKS, MD ED   09/23/2023 0901 09/26/2023 1723 Limited: Do not attempt resuscitation (DNR) -DNR-LIMITED -Do Not Intubate/DNI  [id number]  Trixie Nilda HERO, MD Inpatient   09/09/2023 2249 09/15/2023 2035 Limited: Do not attempt resuscitation (DNR) -DNR-LIMITED -Do Not Intubate/DNI  [id number]  Alfornia Madison, MD ED   08/24/2023 0048 08/24/2023 2345 Limited: Do not attempt resuscitation (DNR) -DNR-LIMITED -Do Not Intubate/DNI  [id number]  Keturah Carrier, MD ED   08/23/2023 2132 08/24/2023 0048 Full Code [id number]  Keturah Carrier, MD ED   08/13/2023 1541 08/21/2023 1933 Full Code [id number]  Seena Marsa NOVAK, MD ED   03/08/2023 1556 03/13/2023 2307 Full Code [id number]  Seena Marsa NOVAK, MD ED   09/27/2022 2058 09/29/2022 1746 Full Code [id number]  Silvester Ales, MD ED   05/12/2022 0328 05/17/2022 2012 Full Code [id number]  Barbarann Nest, MD ED   04/28/2022 0828 05/03/2022 2319 Full Code [id number]  Claudene Maximino LABOR, MD ED   03/12/2022 1409 03/15/2022 1753 Full Code [id number]  Seena Marsa NOVAK, MD ED   08/26/2018 1834 08/27/2018 1702 DNR [id number]  Barbarann Nest, MD ED   08/21/2018 2140 08/24/2018 1914 Full Code [id number]  Tobie Jorie SAUNDERS, MD ED   07/16/2018 1506 08/15/2018 1355 Full Code [id number]  Maurice Sharlet RAMAN, PA-C Inpatient   07/14/2018 0658  07/16/2018 1447 Full Code [id number]  Voncile Isles, MD ED   06/01/2018 1426 06/04/2018 1607 DNR [id number]  Barbarann Nest, MD ED   02/02/2018 1246 02/03/2018 1756 Full Code [id number]  Silvester Ales, MD ED   06/26/2017 1558 06/28/2017 1503 Full Code [id number]  Lovey Satterfield, MD ED   12/20/2016 1932 12/23/2016 2239 Full Code [id number]  Marylu Leita SAUNDERS, NP ED   05/21/2016 0351 05/23/2016 1859 Full Code [id number]  Charlton Evalene RAMAN, MD ED   09/04/2015 1521 09/10/2015 1534 Full Code [id number]  Dina Camie BRAVO, PA-C ED      Advance Directive Documentation    Flowsheet Row Most Recent Value  Type of Advance Directive Healthcare Power of Attorney, Out of facility DNR (pink MOST or yellow form), Living will  Pre-existing out of facility DNR order (yellow form or pink MOST form) --  MOST Form in Place? --    Prognosis:  < 6 months  Discharge Planning: Home with Hospice  Care plan was discussed with primary RN, patient, patient's family, TOC, hospice liaison, Dr. Arlice  Thank you for allowing the Palliative Medicine Team to assist in the care of this patient.     Jeoffrey HERO Claudene, NP  Please contact Palliative Medicine Team phone at [phone] for questions and concerns.   *Portions of this note are a verbal dictation therefore any spelling and/or grammatical errors are due to the Dragon [employer] One system interpretation.

## 2024-02-14 NOTE — Progress Notes (Signed)
 Evan [name] 3W92 [facility] Liaison Note  Received request from [place], Transitions of Care Manager, for hospice services at home after discharge. Spoke with patient's daughter [name] to initiate education related to hospice philosophy, services, and team approach to care. She verbalized understanding of information given. Per discussion, the plan is for discharge home by [facility]AR has not been determined.   DME needs discussed. Patient has oxygen  and hospital bed in the home. [facility] will coordinate swapping oxygen  out. The address has been verified and is correct in the chart. [name] is the family contact to arrange time of equipment delivery. Please send signed and completed DNR home with patient/family.   Please provide prescriptions at discharge as needed to ensure ongoing symptom management.  [facility] information and contact numbers given to [name]. Above information shared with Transitions of Care Manager.  Please call with any questions or concerns.   Thank you for the opportunity to participate in this patient's care.   Amy Darien BSN, RN [facility] Liaison [phone]

## 2024-02-14 NOTE — Progress Notes (Signed)
 PROGRESS NOTE  Evan [name]  DOB: 19-May-1927  PCP: [name], MD FMW:[id number]  DOA: 02/08/2024  LOS: 6 days  Hospital Day: 7  Subjective: Patient was seen and examined this morning. Pleasant elderly Caucasian male.  Propped up in bed.  Alert, awake.  Hard of hearing but able to answer questions.  Looks tachypneic but denies any pain or discomfort.  Son and daughter at bedside. Later seen by palliative care as well. Afebrile, hemodynamically stable, breathing on 3 L  Brief narrative: Evan [name] is a 89 y.o. male with PMH significant for HTN, HLD, CAD s/p CABG, A-fib on Eliquis , left thalamic stroke, diastolic CHF, remote history of lung cancer, recurrent aspiration 11/6, patient was brought to the ED with progressive productive cough, rigors, respiratory distress.  In the ED, patient required BiPAP. Initial workup with a CT chest showed underlying chronic interstitial lung disease with peripheral reticular changes.  Small bilateral pleural effusions.  Chronic right lower lobe consolidative process that is increased. Blood culture sent Was started on broad-spectrum antibiotics He was agitated and required Precedex drip and was admitted to ICU  Blood culture grew strep pneumoniae, urine culture grew Klebsiella pneumoniae and Enterococcus faecalis Gradually wean off Precedex drip in the ICU. Speech eval noted aspiration tendency Palliative care was consulted Family accepted the risk of aspiration and requested to continue on regular diet.  Tentative plan to switch to comfort care if patient deteriorates. 11/12: Transferred out to [facility]  Assessment and plan: Sepsis - POA Secondary to pneumonia, UTI and bacteremia WBC count improved, lactic acid level improved Currently on IV Unasyn , planned for 10 days Recent Labs  Lab 02/08/24 1846 02/08/24 2010 02/09/24 0052 02/09/24 9376 02/10/24 0249 02/11/24 0241 02/12/24 0137 02/13/24 0103  WBC  --   --   --  11.2* 5.8 5.7  5.4 4.9  LATICACIDVEN 3.1*  --  1.9  --   --   --   --   --   PROCALCITON  --  <0.10  --   --   --   --   --   --    Right lower lobe pneumonia POA Acute resp failure with hypoxia H/o COPD CT chest as above Improved with antibiotics as above Initially required BiPAP Currently on 3 L Continue bronchodilators  Enterococcus UTI Antibiotics as above  Strep pneumonia bacteremia Blood culture sent on admission 11/6 grew strep pneumoniae Clinically improving on IV Unasyn . Repeat blood culture ordered Obtain TTE at least  A-fib  Chronic thrombocytopenia Platelet level chronically low less than 50 and stable.   Previously on reduced dose Eliquis .  Currently on hold Given persistently low platelets, would advise not to resume Eliquis  Recent Labs  Lab 02/08/24 1839 02/09/24 0623 02/10/24 0249 02/11/24 0241 02/12/24 0137 02/13/24 0103  PLT 55* 43* 43* 46* 39* 41*   Diastolic heart failure  HTN EF 60 to 65% with dilated RV and LA and reduced RV function in June 2025 BNP 754 (around his baseline) PTA meds-Lopressor  12.5 mg twice daily, Lasix  40 mg daily Currently continued on Lopressor .  Lasix  on hold.  Resume Lasix  40 mg daily today.  CAD s/p CABG Continue metoprolol .   Eliquis  plan as above. Not sure if patient was on a statin or not.  Has not mention of myalgia with statin.  AKI on CKD 2 Acute metabolic acidosis Creatinine and bicarb elevated due to sepsis.  Improved on subsequent lab Recent Labs    09/22/23 1957 09/22/23 1959 09/24/23  9454 10/10/23 1531 11/10/23 2107 02/08/24 1839 02/08/24 1846 02/09/24 0623 02/10/24 0249 02/11/24 0241 02/12/24 0137 02/13/24 0103  BUN 21   < > 12 32* 32* 36* 34* 31* 39* 40* 33* 29*  CREATININE 1.02   < > 1.03 1.04 1.20 1.38* 1.40* 1.43* 1.34* 1.46* 1.16 0.99  CO2 20*  --  22 23 20* 19*  --  16* 19* 18* 20* 20*   < > = values in this interval not displayed.   Acute metabolic encephalopathy Secondary to sepsis Continue to  monitor mental status change  H/o left thalamic stroke Anticoagulation and statin plan as above   Malnutrition RD consulted.  Currently has Cortrak feeding at 45 mL/h   Sacral pressure ulcers wound consult   GOC DNR. Palliative following Discussed that patient is high risk for aspiration. After conversation son agrees that he is ok with accepting the risk of aspiration and if patient detiorates further would transition to make the patient comfortable only.    Mobility: Has not been out of bed since admission.  PT eval ordered  PT Orders: Active   PT Follow up Rec:     Goals of care   Code Status: Limited: Do not attempt resuscitation (DNR) -DNR-LIMITED -Do Not Intubate/DNI      DVT prophylaxis:  heparin  injection 5,000 Units Start: 02/10/24 1200 SCDs Start: 02/08/24 2337   Antimicrobials: IV Unasyn  Fluid: None Consultants: None Family Communication: Son and daughter at bedside  Status: Inpatient Level of care:  Med-Surg   Patient is from: Home Needs to continue in-hospital care: Ongoing workup and management Anticipated d/c to: Pending clinical course, pending PT eval      Diet:  Diet Order             Diet regular Room service appropriate? Yes; Fluid consistency: Thin  Diet effective now                   Scheduled Meds:  arformoterol  15 mcg Nebulization BID   bethanechol  10 mg Per Tube TID   Chlorhexidine  Gluconate Cloth  6 each Topical Daily   collagenase    Topical Daily   feeding supplement (PROSource TF20)  60 mL Per Tube Daily   FLUoxetine   20 mg Per Tube Daily   furosemide   40 mg Oral Daily   heparin  injection (subcutaneous)  5,000 Units Subcutaneous Q12H   melatonin  5 mg Oral QHS   metoprolol  tartrate  12.5 mg Per Tube BID   mupirocin  ointment   Nasal BID   mouth rinse  15 mL Mouth Rinse 4 times per day   pantoprazole  (PROTONIX ) IV  40 mg Intravenous QHS   polyethylene glycol  17 g Per Tube Daily   revefenacin  175 mcg  Nebulization Daily   senna-docusate  1 tablet Per Tube BID   thiamine  100 mg Per Tube Daily    PRN meds: acetaminophen , ALPRAZolam , docusate sodium , hydrOXYzine , mouth rinse, polyethylene glycol   Infusions:   ampicillin  (OMNIPEN) IV 2 g (02/14/24 0945)   feeding supplement (OSMOLITE 1.2 CAL) 45 mL/hr at 02/13/24 1717    Antimicrobials: Anti-infectives (From admission, onward)    Start     Dose/Rate Route Frequency Ordered Stop   02/13/24 0845  nitrofurantoin (macrocrystal-monohydrate) (MACROBID) capsule 100 mg  Status:  Discontinued        100 mg Oral  Once 02/13/24 0750 02/13/24 0818   02/12/24 0800  ampicillin  (OMNIPEN) 2 g in sodium chloride  0.9 % 100  mL IVPB        2 g 300 mL/hr over 20 Minutes Intravenous Every 8 hours 02/12/24 0658 02/17/24 2359   02/11/24 1300  ampicillin  (OMNIPEN) 2 g in sodium chloride  0.9 % 100 mL IVPB  Status:  Discontinued        2 g 300 mL/hr over 20 Minutes Intravenous Every 12 hours 02/11/24 1212 02/12/24 0658   02/09/24 2000  ceFEPIme  (MAXIPIME ) 2 g in sodium chloride  0.9 % 100 mL IVPB  Status:  Discontinued        2 g 200 mL/hr over 30 Minutes Intravenous Every 24 hours 02/08/24 2356 02/09/24 1206   02/09/24 2000  cefTRIAXone  (ROCEPHIN ) 2 g in sodium chloride  0.9 % 100 mL IVPB  Status:  Discontinued        2 g 200 mL/hr over 30 Minutes Intravenous Every 24 hours 02/09/24 1206 02/11/24 1212   02/08/24 2345  cefTRIAXone  (ROCEPHIN ) 2 g in sodium chloride  0.9 % 100 mL IVPB  Status:  Discontinued        2 g 200 mL/hr over 30 Minutes Intravenous Every 24 hours 02/08/24 2340 02/08/24 2341   02/08/24 1900  ceFEPIme  (MAXIPIME ) 2 g in sodium chloride  0.9 % 100 mL IVPB        2 g 200 mL/hr over 30 Minutes Intravenous  Once 02/08/24 1847 02/08/24 1942   02/08/24 1900  metroNIDAZOLE  (FLAGYL ) IVPB 500 mg        500 mg 100 mL/hr over 60 Minutes Intravenous  Once 02/08/24 1847 02/08/24 2130   02/08/24 1900  vancomycin  (VANCOCIN ) IVPB 1000 mg/200 mL premix   Status:  Discontinued        1,000 mg 200 mL/hr over 60 Minutes Intravenous  Once 02/08/24 1847 02/08/24 1851   02/08/24 1900  vancomycin  (VANCOREADY) IVPB 1250 mg/250 mL        1,250 mg 166.7 mL/hr over 90 Minutes Intravenous  Once 02/08/24 1852 02/08/24 2147       Objective: Vitals:   02/14/24 0913 02/14/24 1103  BP:  108/60  Pulse:  77  Resp:  (!) 22  Temp:  98.1 F (36.7 C)  SpO2: 98% 100%    Intake/Output Summary (Last 24 hours) at 02/14/2024 1334 Last data filed at 02/14/2024 0527 Gross per 24 hour  Intake 1123.17 ml  Output 615 ml  Net 508.17 ml   Filed Weights   02/11/24 0500 02/12/24 0500 02/13/24 0500  Weight: 62.3 kg 61.8 kg 60.4 kg   Weight change:  Body mass index is 24.35 kg/m.   Physical Exam: General exam: Pleasant, elderly Caucasian male.  Not in visible pain Skin: No rashes, lesions or ulcers. HEENT: Atraumatic, normocephalic, no obvious bleeding Lungs: Breathing fast but denies any discomfort CVS: S1, S2, ejection systolic murmur,   GI/Abd: Soft, nontender, nondistended, bowel sound present,   CNS: Alert, awake, oriented to place and person Psychiatry: Sad affect Extremities: No pedal edema, no calf tenderness,   Data Review: I have personally reviewed the laboratory data and studies available.  F/u labs ordered Unresulted Labs (From admission, onward)     Start     Ordered   02/14/24 1328  Culture, blood (Routine X 2) w Reflex to ID Panel  BLOOD CULTURE X 2,   R      02/14/24 1327   02/12/24 1359  Phosphorus  (ICU Tube Feeding: PEPuP )  Daily,   R     Question:  Specimen collection method  Answer:  Lab=Lab collect  02/12/24 1358   Unscheduled  Basic metabolic panel with GFR  Tomorrow morning,   R       Question:  Specimen collection method  Answer:  Lab=Lab collect   02/14/24 1334   Unscheduled  CBC with Differential/Platelet  Tomorrow morning,   R       Question:  Specimen collection method  Answer:  Lab=Lab collect   02/14/24 1334             Signed, Chapman Rota, MD [facility] 02/14/2024

## 2024-02-14 NOTE — TOC Progression Note (Addendum)
 Transition of Care (TOC) - Progression Note   Patient from home with 24/7 assistance. Palliative Evan [name] met with family today . Disposition will be home with hospice through [facility] . Evan [name] has referred to [facility] ( [facility]). Patient has home oxygen  with [employer] . Updated Hedda  Patient Details  Name: [name] MRN: [id number] Date of Birth: [birth year]  Transition of Care [facility]) CM/SW Contact  Sebastien Jackson, Powell Jansky, RN Phone Number: 02/14/2024, 2:12 PM  Clinical Narrative:                         Expected Discharge Plan and Services                                               Social Drivers of Health (SDOH) Interventions SDOH Screenings   Food Insecurity: Patient Unable To Answer (02/14/2024)  Housing: Unknown (02/14/2024)  Transportation Needs: Patient Unable To Answer (02/14/2024)  Utilities: Patient Unable To Answer (02/10/2024)  Alcohol  Screen: Low Risk  (04/20/2023)  Depression (PHQ2-9): High Risk (07/10/2023)  Financial Resource Strain: Low Risk  (04/20/2023)  Physical Activity: Insufficiently Active (04/20/2023)  Social Connections: Unknown (02/14/2024)  Stress: No Stress Concern Present (04/20/2023)  Tobacco Use: Medium Risk (02/08/2024)  Health Literacy: Inadequate Health Literacy (04/20/2023)    Readmission Risk Interventions    02/12/2024    1:47 PM 08/15/2023    4:46 PM 03/10/2023    2:24 PM  Readmission Risk Prevention Plan  Transportation Screening Complete Complete Complete  PCP or Specialist Appt within 3-5 Days  Complete   HRI or Home Care Consult  Complete   Palliative Care Screening  Not Applicable   Medication Review (RN Care Manager) Referral to Pharmacy Complete Complete  PCP or Specialist appointment within 3-5 days of discharge Complete  Complete  HRI or Home Care Consult Complete  Complete  SW Recovery Care/Counseling Consult Complete  Complete  Palliative Care Screening Not Applicable  Not  Applicable  Skilled Nursing Facility Not Applicable  Not Applicable

## 2024-02-14 NOTE — Consult Note (Addendum)
 WOC Nurse Consult Note: Consult requested for sacrum/buttocks. Performed remotely after review of the progress notes and photos in the EMR.  Refer to previous WOC consult on 11/7.  Pt was admitted with a deep tissue pressure injury.  These types of wounds are high risk to evolve into full thickness tissue loss, which has occurred at this time.  Sacrum/buttocks with patchy areas of Stage 3 pressure injury and full thickness skin loss surrounding, affected area is approx 10X8 and wounds are approx 70% red, 30% yellow.   Pressure Injury POA: Yes Dressing procedure/placement/frequency: Topical treatment orders provided for bedside nurses to perform as follows to assist with removal of nonviable tissue: Apply Santyl  to sacrum/buttocks wounds Q day, then cover with moist gauze and foam dressing.  Change foam dressing Q 3 days or PRN soiling.  Please re-consult if further assistance is needed.  Thank-you,  Stephane Fought MSN, RN, CWOCN, CWCN-AP, CNS Contact Mon-Fri 0700-1500: [phone]

## 2024-02-15 DIAGNOSIS — Z789 Other specified health status: Secondary | ICD-10-CM

## 2024-02-15 DIAGNOSIS — J9601 Acute respiratory failure with hypoxia: Secondary | ICD-10-CM | POA: Diagnosis not present

## 2024-02-15 DIAGNOSIS — L8990 Pressure ulcer of unspecified site, unspecified stage: Secondary | ICD-10-CM | POA: Insufficient documentation

## 2024-02-15 LAB — BASIC METABOLIC PANEL WITH GFR
Anion gap: 14 (ref 5–15)
BUN: 31 mg/dL — ABNORMAL HIGH (ref 8–23)
CO2: 18 mmol/L — ABNORMAL LOW (ref 22–32)
Calcium: 8.3 mg/dL — ABNORMAL LOW (ref 8.9–10.3)
Chloride: 105 mmol/L (ref 98–111)
Creatinine, Ser: 1 mg/dL (ref 0.61–1.24)
GFR, Estimated: >60 mL/min (ref >60)
Glucose, Bld: 118 mg/dL — ABNORMAL HIGH (ref 70–99)
Potassium: 4.1 mmol/L (ref 3.5–5.1)
Sodium: 137 mmol/L (ref 135–145)

## 2024-02-15 LAB — CBC WITH DIFFERENTIAL/PLATELET
Abs Immature Granulocytes: 0.05 K/uL (ref 0.00–0.07)
Basophils Absolute: 0 K/uL (ref 0.0–0.1)
Basophils Relative: 0 %
Eosinophils Absolute: 0.1 K/uL (ref 0.0–0.5)
Eosinophils Relative: 1 %
HCT: 29 % — ABNORMAL LOW (ref 39.0–52.0)
Hemoglobin: 9.2 g/dL — ABNORMAL LOW (ref 13.0–17.0)
Immature Granulocytes: 1 %
Lymphocytes Relative: 24 %
Lymphs Abs: 1.6 K/uL (ref 0.7–4.0)
MCH: 34.2 pg — ABNORMAL HIGH (ref 26.0–34.0)
MCHC: 31.7 g/dL (ref 30.0–36.0)
MCV: 107.8 fL — ABNORMAL HIGH (ref 80.0–100.0)
Monocytes Absolute: 0.7 K/uL (ref 0.1–1.0)
Monocytes Relative: 10 %
Neutro Abs: 4.4 K/uL (ref 1.7–7.7)
Neutrophils Relative %: 64 %
Platelets: 41 K/uL — ABNORMAL LOW (ref 150–400)
RBC: 2.69 MIL/uL — ABNORMAL LOW (ref 4.22–5.81)
RDW: 17.3 % — ABNORMAL HIGH (ref 11.5–15.5)
WBC: 6.9 K/uL (ref 4.0–10.5)
nRBC: 0 % (ref 0.0–0.2)

## 2024-02-15 LAB — GLUCOSE, CAPILLARY
Glucose-Capillary: 120 mg/dL — ABNORMAL HIGH (ref 70–99)
Glucose-Capillary: 121 mg/dL — ABNORMAL HIGH (ref 70–99)
Glucose-Capillary: 123 mg/dL — ABNORMAL HIGH (ref 70–99)
Glucose-Capillary: 126 mg/dL — ABNORMAL HIGH (ref 70–99)
Glucose-Capillary: 130 mg/dL — ABNORMAL HIGH (ref 70–99)
Glucose-Capillary: 135 mg/dL — ABNORMAL HIGH (ref 70–99)

## 2024-02-15 LAB — PHOSPHORUS: Phosphorus: 3 mg/dL (ref 2.5–4.6)

## 2024-02-15 MED ORDER — MUPIROCIN 2 % EX OINT
TOPICAL_OINTMENT | Freq: Two times a day (BID) | CUTANEOUS | Status: DC
Start: 2024-02-15 — End: 2024-02-20

## 2024-02-15 NOTE — Progress Notes (Signed)
 Daily Progress Note   Patient Name: Evan [name]       Date: 02/15/2024 DOB: 05-08-1927  Age: 89 y.o. MRN#: [id number] Attending Physician: Arlice Reichert, MD Primary Care Physician: [name], MD Admit Date: 02/08/2024  Reason for Consultation/Follow-up: Establishing goals of care  Subjective: I have reviewed medical records including EPIC notes: TOC, hospitalist, nursing, hospice; [facility]; and labs including WBC to assess sepsis/PNA, creatinine of 1.0 for monitoring AKI.  Unable to receive report from primary RN.  Went to visit patient at bedside-son/Gary and caregiver/Teresa present.  Patient is lying in bed -he appears clinically worse than yesterday.  He is lethargic. No respiratory distress, increased work of breathing, or secretions noted.   Emotional support provided to family/visitor.  Provided updates on family meeting yesterday - Arley states all family are on the same page wanting patient's return home with hospice.  Expressed the concern over patient's decline from yesterday to today.  Reviewed that if it is important for patient to return home, family may want to consider discharge sooner than later as he may become too unstable for transfer.  Discussed that he may be at high risk of passing away during transport if he continues to decline.  Arley states that family would not want patient to die in the hospital.  We discussed a discharge as early as today or tomorrow pending family's readiness for him to return home with their caregiving. Arley indicates that they will be ready for patient's discharge home tomorrow and he will notify his siblings and the rest of the family.  He also understands that feeding tube will be discontinued when patient is discharged -he agrees they do not want to  pursue long-term artificial feeding/nutrition.  For now, continue current gentle medical treatment without escalation of care along with regular diet and known risk for aspiration.  If patient declines family will consider transition to full comfort care in house.  Per family patient continues to cough with oral intake and has not been interested in solids/liquids since last night.  Comfort feeding/aspiration precaution education completed per family request.  All questions and concerns addressed. Encouraged to call with questions and/or concerns. PMT card provided.  Length of Stay: 7  Current Medications: Scheduled Meds:   arformoterol  15 mcg Nebulization BID   bethanechol  10 mg  Per Tube TID   Chlorhexidine  Gluconate Cloth  6 each Topical Daily   collagenase    Topical Daily   feeding supplement (PROSource TF20)  60 mL Per Tube Daily   FLUoxetine   20 mg Per Tube Daily   heparin  injection (subcutaneous)  5,000 Units Subcutaneous Q12H   melatonin  5 mg Oral QHS   metoprolol  tartrate  12.5 mg Per Tube BID   mupirocin  ointment   Nasal BID   mouth rinse  15 mL Mouth Rinse 4 times per day   pantoprazole  (PROTONIX ) IV  40 mg Intravenous QHS   polyethylene glycol  17 g Per Tube Daily   revefenacin  175 mcg Nebulization Daily   senna-docusate  1 tablet Per Tube BID   thiamine  100 mg Per Tube Daily    Continuous Infusions:  ampicillin  (OMNIPEN) IV 2 g (02/15/24 0954)   feeding supplement (OSMOLITE 1.2 CAL) 45 mL/hr at 02/13/24 1717    PRN Meds: acetaminophen , ALPRAZolam , docusate sodium , hydrOXYzine , mouth rinse, polyethylene glycol  Physical Exam Vitals and nursing note reviewed.  Constitutional:      General: He is not in acute distress.    Appearance: He is ill-appearing.  Pulmonary:     Effort: No respiratory distress.  Skin:    General: Skin is warm and dry.  Neurological:     Mental Status: He is lethargic.     Motor: Weakness present.             Vital Signs: BP  126/62 (BP Location: Left Arm)   Pulse 77   Temp 97.9 F (36.6 C) (Oral)   Resp 17   Ht 5' 2 (1.575 m)   Wt 65.4 kg   SpO2 100%   BMI 26.37 kg/m  SpO2: SpO2: 100 % O2 Device: O2 Device: Nasal Cannula O2 Flow Rate: O2 Flow Rate (L/min): 3 L/min  Intake/output summary:  Intake/Output Summary (Last 24 hours) at 02/15/2024 1042 Last data filed at 02/15/2024 9472 Gross per 24 hour  Intake --  Output 2425 ml  Net -2425 ml   LBM: Last BM Date : 02/14/24 Baseline Weight: Weight: 63.5 kg Most recent weight: Weight: 65.4 kg       Palliative Assessment/Data: PPS 20%      Patient Active Problem List   Diagnosis Date Noted   Palliative care by specialist 02/12/2024   Acute hypoxic respiratory failure (HCC) 02/08/2024   High risk medications (not anticoagulants) long-term use 12/08/2023   Pain and swelling of right wrist 10/19/2023   CVA (cerebral vascular accident) (HCC) 09/22/2023   Sepsis (HCC) 09/10/2023   Aspiration pneumonia (HCC) 09/09/2023   Hypovolemia 08/24/2023   Cellulitis of upper extremity 08/23/2023   Bilateral pneumonia 08/17/2023   Acute hypoxemic respiratory failure (HCC) 08/13/2023   Chronic systolic heart failure (HCC) 08/10/2023   Acute metabolic acidosis 03/13/2023   Chronic indwelling Foley catheter 03/12/2023   Anemia 09/27/2022   Gastro-esophageal reflux disease without esophagitis 05/17/2022   Hyperlipidemia, unspecified 05/17/2022   Personal history of other venous thrombosis and embolism 05/17/2022   CKD stage 3b, GFR 30-44 ml/min (HCC) - baseline SCr 1.2-1.5 05/17/2022   Presence of coronary angioplasty implant and graft 05/17/2022   Malignant neoplasm of unspecified part of right bronchus or lung (HCC) 05/17/2022   Dementia without behavioral disturbance (HCC) 05/17/2022   Acute on chronic diastolic CHF (congestive heart failure) (HCC) 05/12/2022   Protein-calorie malnutrition, severe 05/12/2022   Chronic respiratory failure, unsp w hypoxia  or hypercapnia (HCC) 04/28/2022  History of pulmonary embolism 04/28/2022   Hemiparesis due to old stroke (HCC) 06/25/2021   Mild aortic stenosis 06/23/2021   Sundowning 07/04/2019   Bilateral hearing loss 04/16/2019   Closed wedge fracture of lumbar vertebra (HCC) 03/14/2019   Pressure ulcer of back 08/27/2018   COPD (chronic obstructive pulmonary disease) (HCC) 08/26/2018   Prolonged Q-T interval on ECG    Dysphagia, post-stroke    Hyponatremia    History of lung cancer    Depression    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   Thrombocytopenia 02/02/2018   Hypokalemia 02/02/2018   Old MI (myocardial infarction) 06/27/2017   HFmrEF (heart failure with mildly reduced ejection fraction) (HCC) 06/26/2017   Oral candidiasis 05/15/2017   Radiation pneumonitis 11/09/2016   Rheumatoid arthritis, unspecified (HCC) 10/11/2016   Subdural hematoma (HCC) 09/13/2016   Permanent atrial fibrillation (HCC) 05/21/2016   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   GAD (generalized anxiety disorder) 11/20/2015   Esophageal ulcer without bleeding    Bilateral lower extremity edema 04/28/2014   CAD (coronary artery disease) 09/09/2013   Benign prostatic hyperplasia without lower urinary tract symptoms 10/08/2012   EKG abnormalities 09/05/2012   Tremor 09/05/2012   Chronic fatigue 09/05/2012   Arthritis    Asthma, chronic    Reflux esophagitis    S/P CABG (coronary artery bypass graft)    Essential hypertension    UNSPECIFIED PERIPHERAL VASCULAR DISEASE 06/16/2009    Palliative Care Assessment & Plan   Patient Profile: 89 y.o. male  with past medical history of dementia, remote lung cancer, ILD, recurrent aspiration, CAD status post CABG, diastolic heart failure with a EF 60 to 65% in June 2025 with also mild RV dysfunction, A-fib on Eliquis , left thalamic stroke admitted on 02/08/2024 with dyspnea.    Family called EMS due to concern for his  respiratory status and he was found to be hypoxic. He was found to have lactic acidosis and AKI with elevated creatinine 1.38. CT chest without contrast 11/6 demonstrates an underlying chronic interstitial lung disease with peripheral reticular changes. Small bilateral pleural effusions. Chronic right lower lobe consolidative process that is increased. He was ultimately admitted for acute hypoxic respiratory failure secondary to CAP vs aspiration PNA and ILD, metabolic encephalopathy. Required BiPAP and precedex, weaning off and on [place] at time of my evaluation.   PMT has been consulted to assist with goals of care conversation. Patient is family to palliative service from 2024 and 2020 hospitalizations.   Assessment: Principal Problem:   Acute hypoxic respiratory failure (HCC) Active Problems:   Palliative care by specialist   Concern about end of life  Recommendations/Plan: Continue current gentle medical treatments without escalation of care Continue regular diet with known risk of aspiration.  Family will consider transition to full comfort in-house in the event of decline Aspiration precautions Continue DNR/DNI as previously documented - durable DNR form completed and placed in shadow chart. Copy was made and will be scanned into Vynca/ACP tab Discharge home with hospice tomorrow 11/14 - family do not want patient to pass away in the hospital PMT will continue to follow and support holistically  Goals of Care and Additional Recommendations: Limitations on Scope of Treatment: Avoid Hospitalization, No Artificial Feeding, and No Tracheostomy  Code Status:    Code Status Orders  (From admission, onward)           Start     Ordered   02/08/24 2337  Do not attempt resuscitation (  DNR)- Limited -Do Not Intubate (DNI)  Continuous       Question Answer Comment  If pulseless and not breathing No CPR or chest compressions.   In Pre-Arrest Conditions (Patient Is Breathing and Has A Pulse)  Do not intubate. Provide all appropriate non-invasive medical interventions. Avoid ICU transfer unless indicated or required.   Consent: Discussion documented in EHR or advanced directives reviewed      02/08/24 2337           Code Status History     Date Active Date Inactive Code Status Order ID Comments User Context   02/08/2024 1924 02/08/2024 2337 Limited: Do not attempt resuscitation (DNR) -DNR-LIMITED -Do Not Intubate/DNI  [id number]  Yolande Lamar BROCKS, MD ED   09/23/2023 0901 09/26/2023 1723 Limited: Do not attempt resuscitation (DNR) -DNR-LIMITED -Do Not Intubate/DNI  [id number]  Trixie Nilda HERO, MD Inpatient   09/09/2023 2249 09/15/2023 2035 Limited: Do not attempt resuscitation (DNR) -DNR-LIMITED -Do Not Intubate/DNI  [id number]  Alfornia Madison, MD ED   08/24/2023 0048 08/24/2023 2345 Limited: Do not attempt resuscitation (DNR) -DNR-LIMITED -Do Not Intubate/DNI  [id number]  Keturah Carrier, MD ED   08/23/2023 2132 08/24/2023 0048 Full Code [id number]  Keturah Carrier, MD ED   08/13/2023 1541 08/21/2023 1933 Full Code [id number]  Seena Marsa NOVAK, MD ED   03/08/2023 1556 03/13/2023 2307 Full Code [id number]  Seena Marsa NOVAK, MD ED   09/27/2022 2058 09/29/2022 1746 Full Code [id number]  Silvester Ales, MD ED   05/12/2022 0328 05/17/2022 2012 Full Code [id number]  Barbarann Nest, MD ED   04/28/2022 0828 05/03/2022 2319 Full Code [id number]  Claudene Maximino LABOR, MD ED   03/12/2022 1409 03/15/2022 1753 Full Code [id number]  Seena Marsa NOVAK, MD ED   08/26/2018 1834 08/27/2018 1702 DNR [id number]  Barbarann Nest, MD ED   08/21/2018 2140 08/24/2018 1914 Full Code [id number]  Tobie Jorie SAUNDERS, MD ED   07/16/2018 1506 08/15/2018 1355 Full Code [id number]  Maurice Sharlet RAMAN, PA-C Inpatient   07/14/2018 0658 07/16/2018 1447 Full Code [id number]  Voncile Isles, MD ED   06/01/2018 1426 06/04/2018 1607 DNR [id number]  Barbarann Nest, MD ED   02/02/2018 1246 02/03/2018 1756 Full Code [id number]  Silvester Ales, MD ED    06/26/2017 1558 06/28/2017 1503 Full Code [id number]  Lovey Satterfield, MD ED   12/20/2016 1932 12/23/2016 2239 Full Code [id number]  Marylu Leita SAUNDERS, NP ED   05/21/2016 0351 05/23/2016 1859 Full Code [id number]  Charlton Evalene RAMAN, MD ED   09/04/2015 1521 09/10/2015 1534 Full Code [id number]  Dina Camie BRAVO, PA-C ED      Advance Directive Documentation    Flowsheet Row Most Recent Value  Type of Advance Directive Healthcare Power of Attorney, Out of facility DNR (pink MOST or yellow form), Living will  Pre-existing out of facility DNR order (yellow form or pink MOST form) --  MOST Form in Place? --    Prognosis:  < 4 weeks  Discharge Planning: Home with Hospice  Care plan was discussed with primary RN, patient's family, Dr. Jeanice, [facility], hospice liaison  Thank you for allowing the Palliative Medicine Team to assist in the care of this patient.     Jeoffrey HERO Claudene, NP  Please contact Palliative Medicine Team phone at [phone] for questions and concerns.   *Portions of this note are a verbal dictation therefore any spelling and/or grammatical errors are due to the Dragon [employer] One system interpretation.

## 2024-02-15 NOTE — Evaluation (Signed)
 Physical Therapy Evaluation Patient Details Name: Evan [name] MRN: [id number] DOB: 1927-04-24 Today's Date: 02/15/2024  History of Present Illness  Evan [name] is a 89 y.o. male admitted 02/08/24 for acute respiratory failure with hypoxia. Work-up found sepsis secondary to PNA, UTI, and bacteremia. CT chest showed underlying chronic interstitial lung disease with peripheral reticular changes, small bilateral pleural effusions, and chronic right lower lobe consolidative process that is increased. Blood culture 11/6 grew strep pneumoniae. PMHx: for HTN, HLD, CAD s/p CABG, A-fib on Eliquis , left thalamic stroke, diastolic CHF, remote history of lung cancer, and recurrent aspiration.   Clinical Impression  Pt admitted with above diagnosis. PTA, pt required assistance with functional mobility, ADLs, and IADLs. His caregiver reports 24/7 supervision and physical assist available from family and his aide. Pt resides on the main level of his home with a ramped entrance. Pt currently with functional limitations due to the deficits listed below (see PT Problem List). He required totalA for bed mobility and maxA to maintain seated balance EOB. Recommend maximove lift to transfer pt OOB. Pt will benefit from acute skilled PT to maximize his safety with mobility to allow discharge, decrease caregiver burden, and provide education.     If plan is discharge home, recommend the following: Two people to help with walking and/or transfers;Two people to help with bathing/dressing/bathroom;Assistance with cooking/housework;Assist for transportation;Help with stairs or ramp for entrance;Supervision due to cognitive status;Direct supervision/assist for medications management;Assistance with feeding   Can travel by private vehicle        Equipment Recommendations None recommended by PT (Pt already has DME)  Recommendations for Other Services       Functional Status Assessment Patient has had a recent decline in  their functional status and/or demonstrates limited ability to make significant improvements in function in a reasonable and predictable amount of time     Precautions / Restrictions Precautions Precautions: Fall Recall of Precautions/Restrictions: Impaired Precaution/Restrictions Comments: Feeding Tube in L nare Restrictions Weight Bearing Restrictions Per Provider Order: No      Mobility  Bed Mobility Overal bed mobility: Needs Assistance Bed Mobility: Rolling, Sidelying to Sit, Sit to Sidelying Rolling: Total assist Sidelying to sit: Total assist     Sit to sidelying: Total assist General bed mobility comments: Sat pt up on L side of bed. No active participation appreciated by pt. Controlled moving BLE and trunk using bed pad. Repositioned using bed features and +2 assist.    Transfers Overall transfer level: Needs assistance                 General transfer comment: Deferred sit<>stand attempt for pt/therapist safety d/t lack of +2 assistance. Recommend maximove lift to transfer OOB.    Ambulation/Gait                  Stairs            Wheelchair Mobility     Tilt Bed    Modified Rankin (Stroke Patients Only)       Balance Overall balance assessment: Needs assistance Sitting-balance support: Single extremity supported, Feet supported Sitting balance-[name] Scale: Poor Sitting balance - Comments: Pt required maxA to maintain upright posture seated EOB. Postural control: Posterior lean                                   Pertinent Vitals/Pain Pain Assessment Pain Assessment: Faces Faces Pain Scale: Hurts little  more Pain Location: Generalized with movement Pain Descriptors / Indicators: Grimacing, Moaning Pain Intervention(s): Monitored during session, Limited activity within patient's tolerance, Repositioned    Home Living Family/patient expects to be discharged to:: Private residence Living Arrangements: Children  (Daughter-in-Law lives downstairs in the basement) Available Help at Discharge: Family;Personal care attendant;Available 24 hours/day (Sons take turns staying at night. Daughter stays during the day. Aide whenever they need her during the day.) Type of Home: House Home Access: Ramped entrance       Home Layout: Two level;Able to live on main level with bedroom/bathroom Home Equipment: Hospital bed;Other (comment);Wheelchair - manual [employer]; Oxygen )      Prior Function Prior Level of Function : Patient poor historian/Family not available;Needs assist       Physical Assist : Mobility (physical);ADLs (physical) Mobility (physical): Bed mobility;Transfers;Gait ADLs (physical): Grooming;Bathing;Dressing;Toileting;IADLs;Feeding Mobility Comments: Caregiver reports 1-2+ assist for bed mobility. She reports someone has to hold onto him to maintain seated balance. Has a lift to transfer to manual w/c. Relies on family to push him. Denies fall hx. ADLs Comments: Family or aide manages all ADLs/IADLs.     Extremity/Trunk Assessment   Upper Extremity Assessment Upper Extremity Assessment: Difficult to assess due to impaired cognition;RUE deficits/detail;LUE deficits/detail;Right hand dominant RUE Deficits / Details: Hemiparesis from prior CVA, worse now per caregiver. Pt with elbow flex contracture and hand contracted into fist. Caregiver reports he may have a brace he wears at home. Minimal activation noted, grossly 2-/5 strength. RUE: Unable to fully assess due to pain RUE Coordination: decreased fine motor;decreased gross motor LUE Deficits / Details: Decreased AROM. Pt globally deconditioned, grossly 2+/5 strength. LUE Coordination: decreased fine motor;decreased gross motor    Lower Extremity Assessment Lower Extremity Assessment: Difficult to assess due to impaired cognition;RLE deficits/detail;LLE deficits/detail RLE Deficits / Details: Hemiparesis from prior CVA, worse now per  caregiver. Decreased AROM. Grossly 2/5 strength. LLE Deficits / Details: Decreased AROM. Pt globally deconditioned, grossly 2+/5 strength.    Cervical / Trunk Assessment Cervical / Trunk Assessment: Kyphotic  Communication   Communication Communication: Impaired Factors Affecting Communication: Hearing impaired;Reduced clarity of speech;Difficulty expressing self    Cognition Arousal: Lethargic Behavior During Therapy: Flat affect   PT - Cognitive impairments: Difficult to assess Difficult to assess due to: Level of arousal                     PT - Cognition Comments: Pt opened eyes during session. Minimal to no communication beyond pt expressing pain and stating he is cold. Following commands: Impaired Following commands impaired: Follows one step commands inconsistently     Cueing Cueing Techniques: Verbal cues, Gestural cues, Tactile cues     General Comments General comments (skin integrity, edema, etc.): VSS on 3L O2 via [place].    Exercises     Assessment/Plan    PT Assessment Patient needs continued PT services  PT Problem List Decreased strength;Decreased range of motion;Decreased activity tolerance;Decreased balance;Decreased mobility;Decreased cognition;Decreased safety awareness;Decreased skin integrity       PT Treatment Interventions Patient/family education;Functional mobility training;Therapeutic activities;Therapeutic exercise;Balance training;Cognitive remediation;DME instruction;Wheelchair mobility training    PT Goals (Current goals can be found in the Care Plan section)  Acute Rehab PT Goals Patient Stated Goal: Caregiver reports to take pt home with hospice support PT Goal Formulation: With family Time For Goal Achievement: 02/29/24 Potential to Achieve Goals: Poor    Frequency Min 1X/week     Co-evaluation  AM-PAC PT 6 Clicks Mobility  Outcome Measure Help needed turning from your back to your side while in a flat  bed without using bedrails?: Total Help needed moving from lying on your back to sitting on the side of a flat bed without using bedrails?: Total Help needed moving to and from a bed to a chair (including a wheelchair)?: Total Help needed standing up from a chair using your arms (e.g., wheelchair or bedside chair)?: Total Help needed to walk in hospital room?: Total Help needed climbing 3-5 steps with a railing? : Total 6 Click Score: 6    End of Session   Activity Tolerance: Patient limited by lethargy;Patient limited by fatigue;Patient limited by pain Patient left: in bed;with call bell/phone within reach;with bed alarm set;with family/visitor present Nurse Communication: Mobility status;Need for lift equipment PT Visit Diagnosis: Adult, failure to thrive (R62.7);Muscle weakness (generalized) (M62.81);Unsteadiness on feet (R26.81);Other abnormalities of gait and mobility (R26.89)    Time: 8783-8766 PT Time Calculation (min) (ACUTE ONLY): 17 min   Charges:   PT Evaluation $PT Eval Moderate Complexity: 1 Mod   PT General Charges $$ ACUTE PT VISIT: 1 Visit         Randall SAUNDERS, PT, DPT Acute Rehabilitation Services Office: [phone] Secure Chat Preferred  Delon CHRISTELLA Callander 02/15/2024, 1:41 PM

## 2024-02-15 NOTE — Progress Notes (Signed)
 PROGRESS NOTE  Evan [name]  DOB: 30-Oct-1927  PCP: [name], MD FMW:[id number]  DOA: 02/08/2024  LOS: 7 days  Hospital Day: 8  Subjective: Patient was seen and examined this morning. Propped up in bed on oxygen .  Alert, awake.  Denies any discomfort.  One of the caregivers is at bedside. Afebrile, hemodynamically stable, breathing on 3 L Seen by palliative care and hospice liaison  Family wants medical optimization and discharge home with hospice services  Brief narrative: Evan [name] is a 89 y.o. male with PMH significant for HTN, HLD, CAD s/p CABG, A-fib on Eliquis , left thalamic stroke, diastolic CHF, remote history of lung cancer, recurrent aspiration 11/6, patient was brought to the ED with progressive productive cough, rigors, respiratory distress.  In the ED, patient required BiPAP. Initial workup with a CT chest showed underlying chronic interstitial lung disease with peripheral reticular changes.  Small bilateral pleural effusions.  Chronic right lower lobe consolidative process that is increased. Blood culture sent Was started on broad-spectrum antibiotics He was agitated and required Precedex drip and was admitted to ICU  Blood culture grew strep pneumoniae, urine culture grew Klebsiella pneumoniae and Enterococcus faecalis Gradually wean off Precedex drip in the ICU. Speech eval noted aspiration tendency Palliative care was consulted Family accepted the risk of aspiration and requested to continue on regular diet.  Tentative plan to switch to comfort care if patient deteriorates. 11/12: Transferred out to [facility]  Assessment and plan: Sepsis - POA Secondary to pneumonia, UTI and bacteremia WBC count improved, lactic acid level improved Currently on IV Unasyn , planned for 10 days. Recent Labs  Lab 02/08/24 1846 02/08/24 2010 02/09/24 0052 02/09/24 9376 02/10/24 0249 02/11/24 0241 02/12/24 0137 02/13/24 0103 02/15/24 0600  WBC  --   --   --    < > 5.8  5.7 5.4 4.9 6.9  LATICACIDVEN 3.1*  --  1.9  --   --   --   --   --   --   PROCALCITON  --  <0.10  --   --   --   --   --   --   --    < > = values in this interval not displayed.   Right lower lobe pneumonia POA Acute resp failure with hypoxia H/o COPD CT chest as above Improved with antibiotics as above Initially required BiPAP Currently on 3 L Continue bronchodilators  Enterococcus UTI Antibiotics as above  Strep pneumonia bacteremia Blood culture sent on admission 11/6 grew strep pneumoniae Clinically improving on IV Unasyn .    11/12, Repeat blood culture sent 11/12, TTE did not show valvular vegetation.  A-fib  Chronic thrombocytopenia Platelet level chronically low less than 50 and stable.   Previously on reduced dose Eliquis .  Currently on hold Given persistently low platelets, would advise not to resume Eliquis  Recent Labs  Lab 02/08/24 1839 02/09/24 0623 02/10/24 0249 02/11/24 0241 02/12/24 0137 02/13/24 0103 02/15/24 0600  PLT 55* 43* 43* 46* 39* 41* 41*   Diastolic heart failure  HTN EF 60 to 65% with dilated RV and LA and reduced RV function in June 2025 BNP 754 (around his baseline) PTA meds-Lopressor  12.5 mg twice daily, Lasix  40 mg daily Currently continued on both  CAD s/p CABG Continue metoprolol .   Eliquis  plan as above. Not sure if patient was on a statin or not.  Has not mention of myalgia with statin.  AKI on CKD 2 Acute metabolic acidosis Creatinine and bicarb elevated  due to sepsis.  Improved on subsequent lab Recent Labs    09/24/23 0545 10/10/23 1531 11/10/23 2107 02/08/24 1839 02/08/24 1846 02/09/24 0623 02/10/24 0249 02/11/24 0241 02/12/24 0137 02/13/24 0103 02/15/24 0600  BUN 12 32* 32* 36* 34* 31* 39* 40* 33* 29* 31*  CREATININE 1.03 1.04 1.20 1.38* 1.40* 1.43* 1.34* 1.46* 1.16 0.99 1.00  CO2 22 23 20* 19*  --  16* 19* 18* 20* 20* 18*   Acute metabolic encephalopathy Secondary to sepsis Continue to monitor mental  status change  H/o left thalamic stroke Anticoagulation and statin plan as above   Malnutrition RD consulted.  Currently has Cortrak feeding at 45 mL/h. Family does not want PEG tube   Sacral pressure ulcers wound consult   GOC DNR. Palliative following Discussed that patient is high risk for aspiration. After conversation son agrees that he is ok with accepting the risk of aspiration and if patient detiorates further would transition to make the patient comfortable only.    Mobility: Has not been out of bed since admission.  PT eval ordered  PT Orders: Active   PT Follow up Rec:    Goals of care   Code Status: Limited: Do not attempt resuscitation (DNR) -DNR-LIMITED -Do Not Intubate/DNI      DVT prophylaxis:  heparin  injection 5,000 Units Start: 02/10/24 1200 SCDs Start: 02/08/24 2337   Antimicrobials: IV Unasyn  Fluid: None Consultants: None Family Communication: Caregiver at bedside  Status: Inpatient Level of care:  Med-Surg   Patient is from: Home Needs to continue in-hospital care: Ongoing workup and management Anticipated d/c to: Pending clinical course, pending PT eval      Diet:  Diet Order             Diet regular Room service appropriate? Yes; Fluid consistency: Thin  Diet effective now                   Scheduled Meds:  arformoterol  15 mcg Nebulization BID   bethanechol  10 mg Per Tube TID   Chlorhexidine  Gluconate Cloth  6 each Topical Daily   collagenase    Topical Daily   feeding supplement (PROSource TF20)  60 mL Per Tube Daily   FLUoxetine   20 mg Per Tube Daily   heparin  injection (subcutaneous)  5,000 Units Subcutaneous Q12H   melatonin  5 mg Oral QHS   metoprolol  tartrate  12.5 mg Per Tube BID   mupirocin  ointment   Nasal BID   mouth rinse  15 mL Mouth Rinse 4 times per day   pantoprazole  (PROTONIX ) IV  40 mg Intravenous QHS   polyethylene glycol  17 g Per Tube Daily   revefenacin  175 mcg Nebulization Daily    senna-docusate  1 tablet Per Tube BID   thiamine  100 mg Per Tube Daily    PRN meds: acetaminophen , ALPRAZolam , docusate sodium , hydrOXYzine , mouth rinse, polyethylene glycol   Infusions:   ampicillin  (OMNIPEN) IV 2 g (02/15/24 0954)   feeding supplement (OSMOLITE 1.2 CAL) 45 mL/hr at 02/13/24 1717    Antimicrobials: Anti-infectives (From admission, onward)    Start     Dose/Rate Route Frequency Ordered Stop   02/13/24 0845  nitrofurantoin (macrocrystal-monohydrate) (MACROBID) capsule 100 mg  Status:  Discontinued        100 mg Oral  Once 02/13/24 0750 02/13/24 0818   02/12/24 0800  ampicillin  (OMNIPEN) 2 g in sodium chloride  0.9 % 100 mL IVPB        2 g  300 mL/hr over 20 Minutes Intravenous Every 8 hours 02/12/24 0658 02/17/24 2359   02/11/24 1300  ampicillin  (OMNIPEN) 2 g in sodium chloride  0.9 % 100 mL IVPB  Status:  Discontinued        2 g 300 mL/hr over 20 Minutes Intravenous Every 12 hours 02/11/24 1212 02/12/24 0658   02/09/24 2000  ceFEPIme  (MAXIPIME ) 2 g in sodium chloride  0.9 % 100 mL IVPB  Status:  Discontinued        2 g 200 mL/hr over 30 Minutes Intravenous Every 24 hours 02/08/24 2356 02/09/24 1206   02/09/24 2000  cefTRIAXone  (ROCEPHIN ) 2 g in sodium chloride  0.9 % 100 mL IVPB  Status:  Discontinued        2 g 200 mL/hr over 30 Minutes Intravenous Every 24 hours 02/09/24 1206 02/11/24 1212   02/08/24 2345  cefTRIAXone  (ROCEPHIN ) 2 g in sodium chloride  0.9 % 100 mL IVPB  Status:  Discontinued        2 g 200 mL/hr over 30 Minutes Intravenous Every 24 hours 02/08/24 2340 02/08/24 2341   02/08/24 1900  ceFEPIme  (MAXIPIME ) 2 g in sodium chloride  0.9 % 100 mL IVPB        2 g 200 mL/hr over 30 Minutes Intravenous  Once 02/08/24 1847 02/08/24 1942   02/08/24 1900  metroNIDAZOLE  (FLAGYL ) IVPB 500 mg        500 mg 100 mL/hr over 60 Minutes Intravenous  Once 02/08/24 1847 02/08/24 2130   02/08/24 1900  vancomycin  (VANCOCIN ) IVPB 1000 mg/200 mL premix  Status:  Discontinued         1,000 mg 200 mL/hr over 60 Minutes Intravenous  Once 02/08/24 1847 02/08/24 1851   02/08/24 1900  vancomycin  (VANCOREADY) IVPB 1250 mg/250 mL        1,250 mg 166.7 mL/hr over 90 Minutes Intravenous  Once 02/08/24 1852 02/08/24 2147       Objective: Vitals:   02/15/24 0923 02/15/24 1122  BP: 126/62 (!) 101/57  Pulse:  (!) 41  Resp:    Temp: 97.9 F (36.6 C) (!) 97.5 F (36.4 C)  SpO2:  (!) 86%    Intake/Output Summary (Last 24 hours) at 02/15/2024 1325 Last data filed at 02/15/2024 1130 Gross per 24 hour  Intake --  Output 2676 ml  Net -2676 ml   Filed Weights   02/12/24 0500 02/13/24 0500 02/15/24 0230  Weight: 61.8 kg 60.4 kg 65.4 kg   Weight change:  Body mass index is 26.37 kg/m.   Physical Exam: General exam: Pleasant, elderly Caucasian male.  Denies any distress Skin: No rashes, lesions or ulcers. HEENT: Atraumatic, normocephalic, no obvious bleeding Lungs: On low-flow oxygen .  Clear to auscultation bilaterally CVS: S1, S2, ejection systolic murmur,   GI/Abd: Soft, nontender, nondistended, bowel sound present,   CNS: Alert, awake, oriented to place and person Psychiatry: Sad affect Extremities: No pedal edema, no calf tenderness,   Data Review: I have personally reviewed the laboratory data and studies available.  F/u labs ordered Unresulted Labs (From admission, onward)    None       Signed, Chapman Rota, MD [facility] 02/15/2024

## 2024-02-15 NOTE — Plan of Care (Signed)
?  Problem: Health Behavior/Discharge Planning: ?Goal: Ability to manage health-related needs will improve ?Outcome: Progressing ?  ?Problem: Coping: ?Goal: Level of anxiety will decrease ?Outcome: Progressing ?  ?Problem: Safety: ?Goal: Ability to remain free from injury will improve ?Outcome: Progressing ?  ?

## 2024-02-16 ENCOUNTER — Other Ambulatory Visit (HOSPITAL_COMMUNITY): Payer: Self-pay

## 2024-02-16 ENCOUNTER — Telehealth: Payer: Self-pay | Admitting: Family Medicine

## 2024-02-16 DIAGNOSIS — J9601 Acute respiratory failure with hypoxia: Secondary | ICD-10-CM | POA: Diagnosis not present

## 2024-02-16 LAB — GLUCOSE, CAPILLARY
Glucose-Capillary: 131 mg/dL — ABNORMAL HIGH (ref 70–99)
Glucose-Capillary: 148 mg/dL — ABNORMAL HIGH (ref 70–99)

## 2024-02-16 MED ORDER — ALPRAZOLAM 0.5 MG PO TABS
0.5000 mg | ORAL_TABLET | Freq: Two times a day (BID) | ORAL | 0 refills | Status: DC | PRN
  Filled 2024-02-16: qty 15, 8d supply, fill #0

## 2024-02-16 MED ORDER — MORPHINE SULFATE (PF) 2 MG/ML IV SOLN
1.0000 mg | Freq: Once | INTRAVENOUS | Status: AC
  Administered 2024-02-16: 1 mg via INTRAVENOUS
  Filled 2024-02-16: qty 1

## 2024-02-16 MED ORDER — ALPRAZOLAM 0.5 MG PO TABS: 0.5000 mg | ORAL_TABLET | Freq: Two times a day (BID) | ORAL | 0 refills | Status: DC | PRN

## 2024-02-16 NOTE — TOC Progression Note (Signed)
 Transition of Care (TOC) - Progression Note   Patient for discharge today. Secure chatted team . Nurse and MD will be ready for [facility] to transport patient at 1145. [facility] called . Right now [facility] will be here at 1145 for pick up , unless they get an emergency . Arley at bedside aware and will call [name].   Arley asking what time hospice will be at the home. Amy with [facility] will call the [facility] office and let them know discharge has been confirmed for today and they will call [name] to schedule a time. Arley aware   [facility] paperwork including DNR on chart   Nurse will remove Coretrak  Patient Details  Name: Evan [name] MRN: [id number] Date of Birth: 11-04-1927  Transition of Care [facility]) CM/SW Contact  Oziel Beitler, Powell Jansky, RN Phone Number: 02/16/2024, 10:45 AM  Clinical Narrative:                         Expected Discharge Plan and Services                                               Social Drivers of Health (SDOH) Interventions SDOH Screenings   Food Insecurity: Patient Unable To Answer (02/14/2024)  Housing: Unknown (02/14/2024)  Transportation Needs: Patient Unable To Answer (02/14/2024)  Utilities: Patient Unable To Answer (02/10/2024)  Alcohol  Screen: Low Risk  (04/20/2023)  Depression (PHQ2-9): High Risk (07/10/2023)  Financial Resource Strain: Low Risk  (04/20/2023)  Physical Activity: Insufficiently Active (04/20/2023)  Social Connections: Unknown (02/14/2024)  Stress: No Stress Concern Present (04/20/2023)  Tobacco Use: Medium Risk (02/08/2024)  Health Literacy: Inadequate Health Literacy (04/20/2023)    Readmission Risk Interventions    02/12/2024    1:47 PM 08/15/2023    4:46 PM 03/10/2023    2:24 PM  Readmission Risk Prevention Plan  Transportation Screening Complete Complete Complete  PCP or Specialist Appt within 3-5 Days  Complete   HRI or Home Care Consult  Complete   Palliative Care Screening  Not Applicable   Medication Review  (RN Care Manager) Referral to Pharmacy Complete Complete  PCP or Specialist appointment within 3-5 days of discharge Complete  Complete  HRI or Home Care Consult Complete  Complete  SW Recovery Care/Counseling Consult Complete  Complete  Palliative Care Screening Not Applicable  Not Applicable  Skilled Nursing Facility Not Applicable  Not Applicable

## 2024-02-16 NOTE — Telephone Encounter (Unsigned)
 Copied from CRM #[id number]. Topic: General - Other >> Feb 16, 2024  3:18 PM Dedra B wrote: Reason for CRM: Joen from [facility] said they received a hospice referral from family. Pt's family is requesting that Dr. Duanne provides service attending of record. [facility] needs to know if he believes the pt has 6 months or less to live and will he service attending. Joen can be reached at [phone].

## 2024-02-16 NOTE — Plan of Care (Signed)
   Problem: Activity: Goal: Risk for activity intolerance will decrease Outcome: Progressing   Problem: Coping: Goal: Level of anxiety will decrease Outcome: Progressing   Problem: Pain Managment: Goal: General experience of comfort will improve and/or be controlled Outcome: Progressing

## 2024-02-16 NOTE — Plan of Care (Signed)
  Problem: Coping: Goal: Level of anxiety will decrease Outcome: Progressing   Problem: Skin Integrity: Goal: Risk for impaired skin integrity will decrease Outcome: Progressing   Problem: Coping: Goal: Ability to adjust to condition or change in health will improve Outcome: Progressing

## 2024-02-16 NOTE — Progress Notes (Signed)
 Daily Progress Note   Patient Name: Evan [name]       Date: 02/16/2024 DOB: Jan 27, 1928  Age: 89 y.o. MRN#: [id number] Attending Physician: Arlice Reichert, MD Primary Care Physician: [name], MD Admit Date: 02/08/2024  Reason for Consultation/Follow-up: Establishing goals of care  Subjective: I have reviewed medical records including EPIC notes: Hospitalist, nursing, TOC, hospice; MAR. As needed medications given in the last 24 hours: Xanax  x 1, Tylenol  x 1.  Received report from primary RN - no acute concerns.   Went to visit patient at bedside-son/Gary present.  Patient was lying in bed minimally responsive. No signs or non-verbal gestures of pain or discomfort noted. No respiratory distress, increased work of breathing, or secretions noted.  He is ill and frail appearing.  Coretrak and mitts in use.   Confirmed with Arley that plan is for patient's discharge home with hospice today.  Discussed symptom management plan for transport and coretrak removal prior to discharge.  Called son/Barry  -he questions patient's discharge timing.  Discharge process reviewed.   All questions and concerns addressed. Encouraged to call with questions and/or concerns. PMT card previously provided.  Symptom management plan discussed with RN.  Length of Stay: 8  Current Medications: Scheduled Meds:   arformoterol  15 mcg Nebulization BID   bethanechol  10 mg Per Tube TID   Chlorhexidine  Gluconate Cloth  6 each Topical Daily   collagenase    Topical Daily   feeding supplement (PROSource TF20)  60 mL Per Tube Daily   FLUoxetine   20 mg Per Tube Daily   heparin  injection (subcutaneous)  5,000 Units Subcutaneous Q12H   melatonin  5 mg Oral QHS   metoprolol  tartrate  12.5 mg Per Tube BID   mupirocin   ointment   Nasal BID   mouth rinse  15 mL Mouth Rinse 4 times per day   pantoprazole  (PROTONIX ) IV  40 mg Intravenous QHS   polyethylene glycol  17 g Per Tube Daily   revefenacin  175 mcg Nebulization Daily   senna-docusate  1 tablet Per Tube BID   thiamine  100 mg Per Tube Daily    Continuous Infusions:  ampicillin  (OMNIPEN) IV 2 g (02/16/24 0929)   feeding supplement (OSMOLITE 1.2 CAL) 45 mL/hr at 02/13/24 1717    PRN Meds: acetaminophen , ALPRAZolam , docusate sodium ,  hydrOXYzine , mouth rinse, polyethylene glycol  Physical Exam Vitals and nursing note reviewed.  Constitutional:      General: He is not in acute distress.    Appearance: He is ill-appearing.  Pulmonary:     Effort: No respiratory distress.  Skin:    General: Skin is warm and dry.  Neurological:     Mental Status: He is lethargic.     Motor: Weakness present.             Vital Signs: BP 128/64   Pulse 77   Temp 98.8 F (37.1 C) (Oral)   Resp 17   Ht 5' 2 (1.575 m)   Wt 66.3 kg   SpO2 99%   BMI 26.73 kg/m  SpO2: SpO2: 99 % O2 Device: O2 Device: Nasal Cannula O2 Flow Rate: O2 Flow Rate (L/min): 3 L/min  Intake/output summary:  Intake/Output Summary (Last 24 hours) at 02/16/2024 1001 Last data filed at 02/15/2024 2000 Gross per 24 hour  Intake --  Output 451 ml  Net -451 ml   LBM: Last BM Date : 02/14/24 Baseline Weight: Weight: 63.5 kg Most recent weight: Weight: 66.3 kg       Palliative Assessment/Data: PPS 10%      Patient Active Problem List   Diagnosis Date Noted   Advanced directive placed in chart this admission 02/15/2024   Pressure injury of skin 02/15/2024   Palliative care by specialist 02/12/2024   Acute hypoxic respiratory failure (HCC) 02/08/2024   High risk medications (not anticoagulants) long-term use 12/08/2023   Pain and swelling of right wrist 10/19/2023   CVA (cerebral vascular accident) (HCC) 09/22/2023   Sepsis (HCC) 09/10/2023   Aspiration pneumonia (HCC)  09/09/2023   Hypovolemia 08/24/2023   Cellulitis of upper extremity 08/23/2023   Bilateral pneumonia 08/17/2023   Acute hypoxemic respiratory failure (HCC) 08/13/2023   Chronic systolic heart failure (HCC) 08/10/2023   Acute metabolic acidosis 03/13/2023   Chronic indwelling Foley catheter 03/12/2023   Anemia 09/27/2022   Gastro-esophageal reflux disease without esophagitis 05/17/2022   Hyperlipidemia, unspecified 05/17/2022   Personal history of other venous thrombosis and embolism 05/17/2022   CKD stage 3b, GFR 30-44 ml/min (HCC) - baseline SCr 1.2-1.5 05/17/2022   Presence of coronary angioplasty implant and graft 05/17/2022   Malignant neoplasm of unspecified part of right bronchus or lung (HCC) 05/17/2022   Dementia without behavioral disturbance (HCC) 05/17/2022   Acute on chronic diastolic CHF (congestive heart failure) (HCC) 05/12/2022   Protein-calorie malnutrition, severe 05/12/2022   Chronic respiratory failure, unsp w hypoxia or hypercapnia (HCC) 04/28/2022   History of pulmonary embolism 04/28/2022   Hemiparesis due to old stroke (HCC) 06/25/2021   Mild aortic stenosis 06/23/2021   Sundowning 07/04/2019   Bilateral hearing loss 04/16/2019   Closed wedge fracture of lumbar vertebra (HCC) 03/14/2019   Pressure ulcer of back 08/27/2018   COPD (chronic obstructive pulmonary disease) (HCC) 08/26/2018   Prolonged Q-T interval on ECG    Dysphagia, post-stroke    Hyponatremia    History of lung cancer    Depression    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   Thrombocytopenia 02/02/2018   Hypokalemia 02/02/2018   Old MI (myocardial infarction) 06/27/2017   HFmrEF (heart failure with mildly reduced ejection fraction) (HCC) 06/26/2017   Oral candidiasis 05/15/2017   Radiation pneumonitis 11/09/2016   Rheumatoid arthritis, unspecified (HCC) 10/11/2016   Subdural hematoma (HCC) 09/13/2016   Permanent atrial fibrillation (HCC) 05/21/2016  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   GAD (generalized anxiety disorder) 11/20/2015   Esophageal ulcer without bleeding    Bilateral lower extremity edema 04/28/2014   CAD (coronary artery disease) 09/09/2013   Benign prostatic hyperplasia without lower urinary tract symptoms 10/08/2012   EKG abnormalities 09/05/2012   Tremor 09/05/2012   Chronic fatigue 09/05/2012   Arthritis    Asthma, chronic    Reflux esophagitis    S/P CABG (coronary artery bypass graft)    Essential hypertension    UNSPECIFIED PERIPHERAL VASCULAR DISEASE 06/16/2009    Palliative Care Assessment & Plan   Patient Profile: 89 y.o. male  with past medical history of dementia, remote lung cancer, ILD, recurrent aspiration, CAD status post CABG, diastolic heart failure with a EF 60 to 65% in June 2025 with also mild RV dysfunction, A-fib on Eliquis , left thalamic stroke admitted on 02/08/2024 with dyspnea.    Family called EMS due to concern for his respiratory status and he was found to be hypoxic. He was found to have lactic acidosis and AKI with elevated creatinine 1.38. CT chest without contrast 11/6 demonstrates an underlying chronic interstitial lung disease with peripheral reticular changes. Small bilateral pleural effusions. Chronic right lower lobe consolidative process that is increased. He was ultimately admitted for acute hypoxic respiratory failure secondary to CAP vs aspiration PNA and ILD, metabolic encephalopathy. Required BiPAP and precedex, weaning off and on [place] at time of my evaluation.   PMT has been consulted to assist with goals of care conversation. Patient is family to palliative service from 2024 and 2020 hospitalizations.   Assessment: Principal Problem:   Acute hypoxic respiratory failure (HCC) Active Problems:   Palliative care by specialist   Advanced directive placed in chart this admission   Pressure injury of skin   Concern about end of life  Recommendations/Plan: Plan  for discharge home with hospice today Remove coretrak Discharge with foley in place Provide morphine  1mg  IV once directly prior to discharge No further acute PMT needs  Goals of Care and Additional Recommendations: Limitations on Scope of Treatment: Avoid Hospitalization  Code Status:    Code Status Orders  (From admission, onward)           Start     Ordered   02/08/24 2337  Do not attempt resuscitation (DNR)- Limited -Do Not Intubate (DNI)  Continuous       Question Answer Comment  If pulseless and not breathing No CPR or chest compressions.   In Pre-Arrest Conditions (Patient Is Breathing and Has A Pulse) Do not intubate. Provide all appropriate non-invasive medical interventions. Avoid ICU transfer unless indicated or required.   Consent: Discussion documented in EHR or advanced directives reviewed      02/08/24 2337           Code Status History     Date Active Date Inactive Code Status Order ID Comments User Context   02/08/2024 1924 02/08/2024 2337 Limited: Do not attempt resuscitation (DNR) -DNR-LIMITED -Do Not Intubate/DNI  [id number]  Yolande Lamar BROCKS, MD ED   09/23/2023 0901 09/26/2023 1723 Limited: Do not attempt resuscitation (DNR) -DNR-LIMITED -Do Not Intubate/DNI  [id number]  Trixie Nilda HERO, MD Inpatient   09/09/2023 2249 09/15/2023 2035 Limited: Do not attempt resuscitation (DNR) -DNR-LIMITED -Do Not Intubate/DNI  [id number]  Alfornia Madison, MD ED   08/24/2023 0048 08/24/2023 2345 Limited: Do not attempt resuscitation (DNR) -DNR-LIMITED -Do Not Intubate/DNI  [id number]  Keturah Carrier, MD ED   08/23/2023 2132 08/24/2023 9951  Full Code [id number]  Keturah Carrier, MD ED   08/13/2023 1541 08/21/2023 1933 Full Code [id number]  Seena Marsa NOVAK, MD ED   03/08/2023 1556 03/13/2023 2307 Full Code [id number]  Seena Marsa NOVAK, MD ED   09/27/2022 2058 09/29/2022 1746 Full Code [id number]  Silvester Ales, MD ED   05/12/2022 0328 05/17/2022 2012 Full Code [id number]   Barbarann Nest, MD ED   04/28/2022 0828 05/03/2022 2319 Full Code [id number]  Claudene Maximino LABOR, MD ED   03/12/2022 1409 03/15/2022 1753 Full Code [id number]  Seena Marsa NOVAK, MD ED   08/26/2018 1834 08/27/2018 1702 DNR [id number]  Barbarann Nest, MD ED   08/21/2018 2140 08/24/2018 1914 Full Code [id number]  Tobie Jorie SAUNDERS, MD ED   07/16/2018 1506 08/15/2018 1355 Full Code [id number]  Maurice Sharlet RAMAN, PA-C Inpatient   07/14/2018 0658 07/16/2018 1447 Full Code [id number]  Voncile Isles, MD ED   06/01/2018 1426 06/04/2018 1607 DNR [id number]  Barbarann Nest, MD ED   02/02/2018 1246 02/03/2018 1756 Full Code [id number]  Silvester Ales, MD ED   06/26/2017 1558 06/28/2017 1503 Full Code [id number]  Lovey Satterfield, MD ED   12/20/2016 1932 12/23/2016 2239 Full Code [id number]  Marylu Leita SAUNDERS, NP ED   05/21/2016 0351 05/23/2016 1859 Full Code [id number]  Charlton Evalene RAMAN, MD ED   09/04/2015 1521 09/10/2015 1534 Full Code [id number]  Dina Camie BRAVO, PA-C ED      Advance Directive Documentation    Flowsheet Row Most Recent Value  Type of Advance Directive Healthcare Power of Attorney, Out of facility DNR (pink MOST or yellow form), Living will  Pre-existing out of facility DNR order (yellow form or pink MOST form) --  MOST Form in Place? --    Prognosis:  < 2 weeks  Discharge Planning: Home with Hospice  Care plan was discussed with primary RN, patient's family, TOC, attending, hospice liaison  Thank you for allowing the Palliative Medicine Team to assist in the care of this patient.     Jeoffrey CHRISTELLA Claudene, NP  Please contact Palliative Medicine Team phone at [phone] for questions and concerns.   *Portions of this note are a verbal dictation therefore any spelling and/or grammatical errors are due to the [employer] system interpretation.

## 2024-02-16 NOTE — Discharge Summary (Signed)
 Physician Discharge Summary  KELLER BOUNDS FMW:[id number] DOB: 25-Feb-1928 DOA: 02/08/2024  PCP: [name], MD  Admit date: 02/08/2024 Discharge date: 02/16/2024  Admitted from: Home Discharge disposition: Home with hospice  Recommendations at discharge:  Okay to take oral meds if able to tolerate.   Comfort feeding allowed per patient and family choice Rest of the care per hospice policy   Subjective: Patient was seen and examined this morning. Propped up in bed on oxygen .  Alert, awake.  Denies any discomfort.   Son at bedside Getting discharged today with hospice services  Brief narrative: Evan [name] is a 89 y.o. male with PMH significant for HTN, HLD, CAD s/p CABG, A-fib on Eliquis , left thalamic stroke, diastolic CHF, remote history of lung cancer, recurrent aspiration 11/6, patient was brought to the ED with progressive productive cough, rigors, respiratory distress.  In the ED, patient required BiPAP. Initial workup with a CT chest showed underlying chronic interstitial lung disease with peripheral reticular changes.  Small bilateral pleural effusions.  Chronic right lower lobe consolidative process that is increased. Blood culture sent Was started on broad-spectrum antibiotics He was agitated and required Precedex drip and was admitted to ICU  Blood culture grew strep pneumoniae, urine culture grew Klebsiella pneumoniae and Enterococcus faecalis Gradually wean off Precedex drip in the ICU. Speech eval noted aspiration tendency Palliative care was consulted Family accepted the risk of aspiration and requested to continue on regular diet.  Tentative plan to switch to comfort care if patient deteriorates. 11/12: Transferred out to [facility] Palliative care consulted.  After discussion with family, family chose comfort care measures  Hospital course: Sepsis - POA Secondary to pneumonia, UTI and bacteremia WBC count improved, lactic acid level improved Completed the  course of IV Unasyn   Right lower lobe pneumonia POA Acute resp failure with hypoxia H/o COPD CT chest as above Improved with antibiotics as above Initially required BiPAP Currently on 3 L.  To discharge home with oxygen   Enterococcus UTI Antibiotics as above  Strep pneumonia bacteremia Blood culture sent on admission 11/6 grew strep pneumoniae 11/12, Repeat blood culture sent on 11/12 did not show any growth 11/12, TTE did not show valvular vegetation. Completed a course of antibiotics  A-fib  Chronic thrombocytopenia Platelet level chronically low less than 50 and stable.   Previously on reduced dose Eliquis .  Eliquis  has been stopped given low platelets and plan of hospice care Recent Labs  Lab 02/10/24 0249 02/11/24 0241 02/12/24 0137 02/13/24 0103 02/15/24 0600  PLT 43* 46* 39* 41* 41*   Diastolic heart failure  HTN CAD s/p CABG EF 60 to 65% with dilated RV and LA and reduced RV function in June 2025 BNP 754 (around his baseline) PTA meds-Lopressor  12.5 mg twice daily, Lasix  40 mg daily Currently continued on both.  Okay to continue blood pressure at home if patient is able to tolerate.  With the risk of dehydration I would not continue Lasix   H/o left thalamic stroke Anticoagulation and statin plan as above  AKI on CKD 2 Acute metabolic acidosis Creatinine and bicarb elevated due to sepsis.  Improved on subsequent lab Recent Labs    09/24/23 0545 10/10/23 1531 11/10/23 2107 02/08/24 1839 02/08/24 1846 02/09/24 0623 02/10/24 0249 02/11/24 0241 02/12/24 0137 02/13/24 0103 02/15/24 0600  BUN 12 32* 32* 36* 34* 31* 39* 40* 33* 29* 31*  CREATININE 1.03 1.04 1.20 1.38* 1.40* 1.43* 1.34* 1.46* 1.16 0.99 1.00  CO2 22 23 20* 19*  --  16* 19* 18* 20* 20* 18*   Acute metabolic encephalopathy Secondary to sepsis Alert, awake    Malnutrition RD consulted.  Was given Cortrak feeding. Family does not want PEG tube Discussed that patient is high risk for  aspiration. After conversation son agrees that he is ok with accepting the risk of aspiration.  Comfort feed allowed   Goals of care   Code Status: Limited: Do not attempt resuscitation (DNR) -DNR-LIMITED -Do Not Intubate/DNI    Diet:  Diet Order             Diet general           Diet regular Room service appropriate? Yes; Fluid consistency: Thin  Diet effective now                   Nutritional status:  Body mass index is 26.73 kg/m.  Nutrition Problem: Severe Malnutrition Etiology: chronic illness (HF, dysphagia) Signs/Symptoms: severe fat depletion, severe muscle depletion  Wounds:  - Wound 02/08/24 1847 Pressure Injury Sacrum Stage 3 -  Full thickness tissue loss. Subcutaneous fat may be visible but bone, tendon or muscle are NOT exposed. (Active)  Date First Assessed/Time First Assessed: 02/08/24 1847   Present on Original Admission: Yes  Primary Wound Type: Pressure Injury  Location: Sacrum  Staging: Stage 3 -  Full thickness tissue loss. Subcutaneous fat may be visible but bone, tendon or mus...    Assessments 02/09/2024 12:00 PM 02/16/2024  9:15 AM  Wound Image     Site / Wound Assessment Pink;Red Dressing in place / Unable to assess  Peri-wound Assessment Pink --  Wound Length (cm) 7.5 cm --  Wound Width (cm) 7.5 cm --  Wound Surface Area (cm^2) 44.18 cm^2 --  Treatment Cleansed;Off loading --  Dressing Type Foam - Lift dressing to assess site every shift;Impregnated gauze (bismuth ) --  Dressing Changed New --  Dressing Status Clean, Dry, Intact Clean, Dry, Intact;Clean     No associated orders.    Discharge Medications:   Allergies as of 02/16/2024       Reactions   Feldene [piroxicam] Rash   Blistering rash   Neomycin -polymyxin-hc Itching   Statins Other (See Comments)   Myalgias   Latex Rash   Band Aid   Tequin [gatifloxacin] Swelling, Anxiety   Valium [diazepam] Other (See Comments)   Dizziness   Vibramycin  [doxycycline ] Itching, Nausea Only         Medication List     STOP taking these medications    colchicine  0.6 MG tablet   Eliquis  2.5 MG Tabs tablet Generic drug: apixaban    FeroSul 325 (65 Fe) MG tablet Generic drug: ferrous sulfate    folic acid  1 MG tablet Commonly known as: FOLVITE    furosemide  40 MG tablet Commonly known as: LASIX    potassium chloride  SA 20 MEQ tablet Commonly known as: KLOR-CON  M   rosuvastatin  5 MG tablet Commonly known as: CRESTOR        TAKE these medications    acetaminophen  325 MG tablet Commonly known as: TYLENOL  Take 325-650 mg by mouth every other day.   ALPRAZolam  0.5 MG tablet Commonly known as: XANAX  Take 1 tablet (0.5 mg total) by mouth 2 (two) times daily as needed for anxiety.   FLUoxetine  20 MG capsule Commonly known as: PROZAC  TAKE 1 CAPSULE EVERY DAY   hydrOXYzine  10 MG/5ML syrup Commonly known as: ATARAX  Take 5 mLs (10 mg total) by mouth every 8 (eight) hours as needed for itching.  Melatonin 5 MG Caps Take 5 mg by mouth at bedtime.   metoprolol  tartrate 25 MG tablet Commonly known as: LOPRESSOR  TAKE 1/2 TAB DAILY. MAY TAKE AN ADDITIONAL 1/2 TAB DAILY IF NEEDED FOR CHEST PAIN OR ELEVATED BLOOD PRESSURE.   nitroGLYCERIN  0.4 MG SL tablet Commonly known as: NITROSTAT  Place 1 tablet (0.4 mg total) under the tongue every 5 (five) minutes as needed for chest pain.   OXYGEN  Inhale 2 L/min into the lungs as needed (Shortness of breath).   pantoprazole  40 MG tablet Commonly known as: PROTONIX  TAKE 1 TABLET EVERY MORNING   senna-docusate 8.6-50 MG tablet Commonly known as: Stimulant Laxative Take 2 tablets by mouth 2 (two) times daily as needed for constipation   Symbicort  160-4.5 MCG/ACT inhaler Generic drug: budesonide -formoterol  Inhale 2 puffs into the lungs in the morning and at bedtime.   tamsulosin  0.4 MG Caps capsule Commonly known as: FLOMAX  TAKE 1 CAPSULE EVERY DAY WITH SUPPER   Zinc  Oxide 12.8 % ointment Commonly known as: TRIPLE  PASTE Apply topically 4 (four) times daily as needed (skin barrier).               Discharge Care Instructions  (From admission, onward)           Start     Ordered   02/16/24 0000  Discharge wound care:        02/16/24 1123             Follow ups:    Follow-up Information     [facility] Follow up.   Specialty: Hospice and Palliative Medicine Contact information: [address] [place]  [address] [phone]        [name], MD Follow up.   Specialty: Family Medicine Contact information: [address] [place] [address] [phone]                 Discharge Instructions:   Discharge Instructions     Call MD for:  difficulty breathing, headache or visual disturbances   Complete by: As directed    Call MD for:  extreme fatigue   Complete by: As directed    Call MD for:  hives   Complete by: As directed    Call MD for:  persistant dizziness or light-headedness   Complete by: As directed    Call MD for:  persistant nausea and vomiting   Complete by: As directed    Call MD for:  severe uncontrolled pain   Complete by: As directed    Call MD for:  temperature >100.4   Complete by: As directed    Diet general   Complete by: As directed    Discharge instructions   Complete by: As directed    Recommendations at discharge:   Okay to take oral meds if able to tolerate.    Comfort feeding allowed per patient and family choice  Rest of the care per hospice policy  General discharge instructions: Follow with Primary MD [name], MD in 7 days  Please request your PCP  to go over your hospital tests, procedures, radiology results at the follow up. Please get your medicines reviewed and adjusted.  Your PCP may decide to repeat certain labs or tests as needed. Do not drive, operate heavy machinery, perform activities at heights, swimming or participation in water activities or provide baby  sitting services if your were admitted for syncope or siezures until you have seen by Primary MD or  a Neurologist and advised to do so again. [place]  Controlled Substance Reporting System database was reviewed. Do not drive, operate heavy machinery, perform activities at heights, swim, participate in water activities or provide baby-sitting services while on medications for pain, sleep and mood until your outpatient physician has reevaluated you and advised to do so again.  You are strongly recommended to comply with the dose, frequency and duration of prescribed medications. Activity: As tolerated with Full fall precautions use walker/cane & assistance as needed Avoid using any recreational substances like cigarette, tobacco, alcohol , or non-prescribed drug. If you experience worsening of your admission symptoms, develop shortness of breath, life threatening emergency, suicidal or homicidal thoughts you must seek medical attention immediately by calling 911 or calling your MD immediately  if symptoms less severe. You must read complete instructions/literature along with all the possible adverse reactions/side effects for all the medicines you take and that have been prescribed to you. Take any new medicine only after you have completely understood and accepted all the possible adverse reactions/side effects.  Wear Seat belts while driving. You were cared for by a hospitalist during your hospital stay. If you have any questions about your discharge medications or the care you received while you were in the hospital after you are discharged, you can call the unit and ask to speak with the hospitalist or the covering physician. Once you are discharged, your primary care physician will handle any further medical issues. Please note that NO REFILLS for any discharge medications will be authorized once you are discharged, as it is imperative that you return to your primary care physician (or establish a  relationship with a primary care physician if you do not have one).   Discharge wound care:   Complete by: As directed    Increase activity slowly   Complete by: As directed        Discharge Exam:   Vitals:   02/16/24 0500 02/16/24 0530 02/16/24 0853 02/16/24 1057  BP:  128/64  (!) 140/110  Pulse:  77  79  Resp:    16  Temp:  98.8 F (37.1 C)  98.9 F (37.2 C)  TempSrc:  Oral  Oral  SpO2:  100% 99% 100%  Weight: 66.3 kg     Height:        Body mass index is 26.73 kg/m.  General exam: Pleasant, elderly Caucasian male.  Denies any distress Skin: No rashes, lesions or ulcers. HEENT: Atraumatic, normocephalic, no obvious bleeding Lungs: On low-flow oxygen .  Clear to auscultation bilaterally CVS: S1, S2, ejection systolic murmur,   GI/Abd: Soft, nontender, nondistended, bowel sound present,   CNS: Alert, awake, oriented to place and person Psychiatry: Sad affect Extremities: No pedal edema, no calf tenderness   The results of significant diagnostics from this hospitalization (including imaging, microbiology, ancillary and laboratory) are listed below for reference.    Procedures and Diagnostic Studies:   CT Chest Wo Contrast Result Date: 02/08/2024 CLINICAL DATA:  Remote history of lung cancer according to prior reports, possible sepsis short of breath EXAM: CT CHEST WITHOUT CONTRAST TECHNIQUE: Multidetector CT imaging of the chest was performed following the standard protocol without IV contrast. RADIATION DOSE REDUCTION: This exam was performed according to the departmental dose-optimization program which includes automated exposure control, adjustment of the mA and/or kV according to patient size and/or use of iterative reconstruction technique. COMPARISON:  Chest x-ray 02/08/2024, CT 09/09/2023, 03/13/2023, 06/01/2018, exams dating back to 2017 FINDINGS: Cardiovascular: Limited assessment without  intravenous contrast. Advanced aortic atherosclerosis. No aneurysm. Post CABG  changes. Multi-vessel coronary vascular calcification. Cardiomegaly. No significant pericardial effusion Mediastinum/Nodes: Patent trachea. No thyroid  mass. Multiple borderline to mildly enlarged mediastinal nodes. Right paratracheal nodes measuring up to 14 mm. Subcarinal node measuring up to 16 mm. Limited assessment for hilar nodes without contrast Lungs/Pleura: Underlying chronic interstitial lung disease with peripheral reticular changes. Small bilateral pleural effusions. Mild diffuse bilateral heterogeneous ground-glass disease. Right lower lobe consolidative process is chronic and some of this is felt secondary to post therapeutic change and history of lung cancer as seen on more remote exams, however consolidation is slightly progressive compared with more remote examination, for example from 2020. Upper Abdomen: No acute finding. Musculoskeletal: Sternotomy.  Chronic compression deformity at L1. IMPRESSION: 1. Underlying chronic interstitial lung disease with peripheral reticular changes. Cardiomegaly with small bilateral pleural effusions. Fairly widespread bilateral heterogeneous ground-glass density suggesting superimposed edema and/or infection. 2. Right lower lobe consolidative process is chronic compared with multiple prior exams and part of this is felt secondary to post therapeutic change related to the patient's prior history of lung cancer. Slowly progressive consolidative disease at the right lower lobe though when compared to more remote examinations, differential considerations include recurrent pneumonia or aspiration vs neoplastic process. Correlation with bronchoscopy could be considered if appropriate given patient age and comorbidities. 3. Multiple borderline to mildly enlarged mediastinal nodes, nonspecific, but potentially reactive. 4. Aortic atherosclerosis. Aortic Atherosclerosis (ICD10-I70.0). Electronically Signed   By: Luke Bun M.D.   On: 02/08/2024 22:16   DG Chest Port 1  View Result Date: 02/08/2024 CLINICAL DATA:  Sepsis EXAM: PORTABLE CHEST 1 VIEW COMPARISON:  11/10/2023 FINDINGS: Single frontal view of the chest excludes portions of the lung bases due to collimation. Cardiac silhouette is enlarged. There is pulmonary vascular congestion with bibasilar consolidation and small right pleural effusion consistent with congestive heart failure. No pneumothorax. IMPRESSION: 1. Constellation of findings most consistent with congestive heart failure. Electronically Signed   By: Ozell Daring M.D.   On: 02/08/2024 19:18     Labs:   Basic Metabolic Panel: Recent Labs  Lab 02/10/24 0249 02/11/24 0241 02/12/24 0137 02/13/24 0103 02/14/24 0458 02/15/24 0600  NA 139 142 141 145  --  137  K 3.3* 3.3* 3.5 4.0  --  4.1  CL 109 109 112* 112*  --  105  CO2 19* 18* 20* 20*  --  18*  GLUCOSE 105* 79 98 119*  --  118*  BUN 39* 40* 33* 29*  --  31*  CREATININE 1.34* 1.46* 1.16 0.99  --  1.00  CALCIUM  8.9 8.8* 8.7* 8.7*  --  8.3*  MG 1.8 2.3 2.0  --   --   --   PHOS  --   --  2.3* 3.1 2.7 3.0   GFR Estimated Creatinine Clearance: 37.9 mL/min (by C-G formula based on SCr of 1 mg/dL). Liver Function Tests: Recent Labs  Lab 02/11/24 0241  AST 16  ALT 9  ALKPHOS 83  BILITOT 1.0  PROT 6.4*  ALBUMIN 2.4*   No results for input(s): LIPASE, AMYLASE in the last 168 hours. Recent Labs  Lab 02/11/24 0241  AMMONIA 17   Coagulation profile No results for input(s): INR, PROTIME in the last 168 hours.  CBC: Recent Labs  Lab 02/10/24 0249 02/11/24 0241 02/12/24 0137 02/13/24 0103 02/15/24 0600  WBC 5.8 5.7 5.4 4.9 6.9  NEUTROABS  --   --   --   --  4.4  HGB 9.1* 8.7* 8.9* 9.1* 9.2*  HCT 28.6* 27.7* 27.9* 29.0* 29.0*  MCV 108.7* 109.5* 109.4* 109.8* 107.8*  PLT 43* 46* 39* 41* 41*   Cardiac Enzymes: No results for input(s): CKTOTAL, CKMB, CKMBINDEX, TROPONINI in the last 168 hours. BNP: Invalid input(s): POCBNP CBG: Recent Labs  Lab  02/15/24 1712 02/15/24 2105 02/15/24 2347 02/16/24 0541 02/16/24 0845  GLUCAP 135* 123* 121* 131* 148*   D-Dimer No results for input(s): DDIMER in the last 72 hours. Hgb A1c No results for input(s): HGBA1C in the last 72 hours. Lipid Profile No results for input(s): CHOL, HDL, LDLCALC, TRIG, CHOLHDL, LDLDIRECT in the last 72 hours. Thyroid  function studies No results for input(s): TSH, T4TOTAL, T3FREE, THYROIDAB in the last 72 hours.  Invalid input(s): FREET3 Anemia work up No results for input(s): VITAMINB12, FOLATE, FERRITIN, TIBC, IRON , RETICCTPCT in the last 72 hours. Microbiology Recent Results (from the past 240 hours)  Blood Culture (routine x 2)     Status: Abnormal   Collection Time: 02/08/24  6:39 PM   Specimen: BLOOD RIGHT ARM  Result Value Ref Range Status   Specimen Description BLOOD RIGHT ARM  Final   Special Requests   Final    BOTTLES DRAWN AEROBIC AND ANAEROBIC Blood Culture adequate volume   Culture  Setup Time   Final    GRAM POSITIVE COCCI IN CHAINS IN BOTH AEROBIC AND ANAEROBIC BOTTLES CRITICAL RESULT CALLED TO, READ BACK BY AND VERIFIED WITH: E. SINCLAIR PHARMD, AT 1110 02/09/24 D. VANHOOK    Culture (A)  Final    STREPTOCOCCUS PNEUMONIAE SUSCEPTIBILITIES PERFORMED ON PREVIOUS CULTURE WITHIN THE LAST 5 DAYS. Performed at [facility], [address] [place], [place] [address]    Report Status 02/11/2024 FINAL  Final  Resp panel by RT-PCR (RSV, Flu A&B, Covid) Anterior Nasal Swab     Status: None   Collection Time: 02/08/24  6:47 PM   Specimen: Anterior Nasal Swab  Result Value Ref Range Status   SARS Coronavirus 2 by RT PCR NEGATIVE NEGATIVE Final   Influenza A by PCR NEGATIVE NEGATIVE Final   Influenza B by PCR NEGATIVE NEGATIVE Final    Comment: (NOTE) The Xpert Xpress SARS-CoV-2/FLU/RSV plus assay is intended as an aid in the diagnosis of influenza from Nasopharyngeal swab specimens and should not  be used as a sole basis for treatment. Nasal washings and aspirates are unacceptable for Xpert Xpress SARS-CoV-2/FLU/RSV testing.  Fact Sheet for Patients: bloggercourse.com  Fact Sheet for Healthcare Providers: seriousbroker.it  This test is not yet approved or cleared by the [place]  FDA and has been authorized for detection and/or diagnosis of SARS-CoV-2 by FDA under an Emergency Use Authorization (EUA). This EUA will remain in effect (meaning this test can be used) for the duration of the COVID-19 declaration under Section 564(b)(1) of the Act, 21 U.S.C. section 360bbb-3(b)(1), unless the authorization is terminated or revoked.     Resp Syncytial Virus by PCR NEGATIVE NEGATIVE Final    Comment: (NOTE) Fact Sheet for Patients: bloggercourse.com  Fact Sheet for Healthcare Providers: seriousbroker.it  This test is not yet approved or cleared by the [place]  FDA and has been authorized for detection and/or diagnosis of SARS-CoV-2 by FDA under an Emergency Use Authorization (EUA). This EUA will remain in effect (meaning this test can be used) for the duration of the COVID-19 declaration under Section 564(b)(1) of the Act, 21 U.S.C. section 360bbb-3(b)(1), unless the authorization is terminated or revoked.  Performed at [facility], [address], [place], [place] [address]   Blood Culture (routine x 2)     Status: Abnormal   Collection Time: 02/08/24  6:56 PM   Specimen: BLOOD LEFT ARM  Result Value Ref Range Status   Specimen Description BLOOD LEFT ARM  Final   Special Requests   Final    BOTTLES DRAWN AEROBIC AND ANAEROBIC Blood Culture adequate volume   Culture  Setup Time   Final    GRAM POSITIVE COCCI IN CHAINS IN BOTH AEROBIC AND ANAEROBIC BOTTLES CRITICAL RESULT CALLED TO, READ BACK BY AND VERIFIED WITH: E. SINCLAIR PHARMD, AT 1110 02/09/24 D.  VANHOOK Performed at [facility], [address], [place], [place] [address]    Culture STREPTOCOCCUS PNEUMONIAE (A)  Final   Report Status 02/11/2024 FINAL  Final   Organism ID, Bacteria STREPTOCOCCUS PNEUMONIAE  Final      Susceptibility   Streptococcus pneumoniae - MIC*    ERYTHROMYCIN  >=8 RESISTANT Resistant     LEVOFLOXACIN  0.5 SENSITIVE Sensitive     VANCOMYCIN  0.25 SENSITIVE Sensitive     PENICILLIN (meningitis) 0.25 RESISTANT Resistant     PENO - penicillin 0.25      PENICILLIN (non-meningitis) 0.25 SENSITIVE Sensitive     PENICILLIN (oral) 0.25 INTERMEDIATE Intermediate     CEFTRIAXONE  (non-meningitis) 0.25 SENSITIVE Sensitive     CEFTRIAXONE  (meningitis) 0.25 SENSITIVE Sensitive     * STREPTOCOCCUS PNEUMONIAE  Blood Culture ID Panel (Reflexed)     Status: Abnormal   Collection Time: 02/08/24  6:56 PM  Result Value Ref Range Status   Enterococcus faecalis NOT DETECTED NOT DETECTED Final   Enterococcus Faecium NOT DETECTED NOT DETECTED Final   Listeria monocytogenes NOT DETECTED NOT DETECTED Final   Staphylococcus species NOT DETECTED NOT DETECTED Final   Staphylococcus aureus (BCID) NOT DETECTED NOT DETECTED Final   Staphylococcus epidermidis NOT DETECTED NOT DETECTED Final   Staphylococcus lugdunensis NOT DETECTED NOT DETECTED Final   Streptococcus species DETECTED (A) NOT DETECTED Final    Comment: CRITICAL RESULT CALLED TO, READ BACK BY AND VERIFIED WITH: E. SINCLAIR PHARMD, AT 1110 02/09/24 D. VANHOOK    Streptococcus agalactiae NOT DETECTED NOT DETECTED Final   Streptococcus pneumoniae DETECTED (A) NOT DETECTED Final    Comment: CRITICAL RESULT CALLED TO, READ BACK BY AND VERIFIED WITH: E. SINCLAIR PHARMD, AT 1110 02/09/24 D. VANHOOK    Streptococcus pyogenes NOT DETECTED NOT DETECTED Final   A.calcoaceticus-baumannii NOT DETECTED NOT DETECTED Final   Bacteroides fragilis NOT DETECTED NOT DETECTED Final   Enterobacterales NOT DETECTED NOT DETECTED Final    Enterobacter cloacae complex NOT DETECTED NOT DETECTED Final   Escherichia coli NOT DETECTED NOT DETECTED Final   Klebsiella aerogenes NOT DETECTED NOT DETECTED Final   Klebsiella oxytoca NOT DETECTED NOT DETECTED Final   Klebsiella pneumoniae NOT DETECTED NOT DETECTED Final   Proteus species NOT DETECTED NOT DETECTED Final   Salmonella species NOT DETECTED NOT DETECTED Final   Serratia marcescens NOT DETECTED NOT DETECTED Final   Haemophilus influenzae NOT DETECTED NOT DETECTED Final   Neisseria meningitidis NOT DETECTED NOT DETECTED Final   Pseudomonas aeruginosa NOT DETECTED NOT DETECTED Final   Stenotrophomonas maltophilia NOT DETECTED NOT DETECTED Final   Candida albicans NOT DETECTED NOT DETECTED Final   Candida auris NOT DETECTED NOT DETECTED Final   Candida glabrata NOT DETECTED NOT DETECTED Final   Candida krusei NOT DETECTED NOT DETECTED Final   Candida parapsilosis NOT DETECTED  NOT DETECTED Final   Candida tropicalis NOT DETECTED NOT DETECTED Final   Cryptococcus neoformans/gattii NOT DETECTED NOT DETECTED Final    Comment: Performed at [facility], [address] [place], [place] [address]  MRSA Next Gen by PCR, Nasal     Status: Abnormal   Collection Time: 02/09/24  2:29 AM   Specimen: Urine, Catheterized; Nasal Swab  Result Value Ref Range Status   MRSA by PCR Next Gen DETECTED (A) NOT DETECTED Final    Comment: CRITICAL RESULTS CALLED TO, READ BACK BY AND VERIFIED WITH: RN B.TOOTHMAN ON 02/09/24 AT 0452 BY NM (NOTE) The GeneXpert MRSA Assay (FDA approved for NASAL specimens only), is one component of a comprehensive MRSA colonization surveillance program. It is not intended to diagnose MRSA infection nor to guide or monitor treatment for MRSA infections. Test performance is not FDA approved in patients less than 63 years old. Performed at [facility], [address], [place], [place] [address]   Urine Culture     Status: Abnormal   Collection Time:  02/09/24  2:29 AM   Specimen: Urine, Random  Result Value Ref Range Status   Specimen Description URINE, RANDOM  Final   Special Requests   Final    NONE Reflexed from [phone] Performed at [facility], [address] [place], [place] [address]    Culture (A)  Final    30,000 COLONIES/mL KLEBSIELLA PNEUMONIAE >=100,000 COLONIES/mL ENTEROCOCCUS FAECALIS    Report Status 02/11/2024 FINAL  Final   Organism ID, Bacteria KLEBSIELLA PNEUMONIAE (A)  Final   Organism ID, Bacteria ENTEROCOCCUS FAECALIS (A)  Final      Susceptibility   Enterococcus faecalis - MIC*    AMPICILLIN  <=2 SENSITIVE Sensitive     NITROFURANTOIN <=16 SENSITIVE Sensitive     VANCOMYCIN  1 SENSITIVE Sensitive     * >=100,000 COLONIES/mL ENTEROCOCCUS FAECALIS   Klebsiella pneumoniae - MIC*    AMPICILLIN  >=32 RESISTANT Resistant     CEFAZOLIN  (URINE) Value in next row Sensitive      4 SENSITIVEThis is a modified FDA-approved test that has been validated and its performance characteristics determined by the reporting laboratory.  This laboratory is certified under the Clinical Laboratory Improvement Amendments CLIA as qualified to perform high complexity clinical laboratory testing.    CEFEPIME  Value in next row Sensitive      4 SENSITIVEThis is a modified FDA-approved test that has been validated and its performance characteristics determined by the reporting laboratory.  This laboratory is certified under the Clinical Laboratory Improvement Amendments CLIA as qualified to perform high complexity clinical laboratory testing.    ERTAPENEM Value in next row Sensitive      4 SENSITIVEThis is a modified FDA-approved test that has been validated and its performance characteristics determined by the reporting laboratory.  This laboratory is certified under the Clinical Laboratory Improvement Amendments CLIA as qualified to perform high complexity clinical laboratory testing.    CEFTRIAXONE  Value in next row Sensitive      4  SENSITIVEThis is a modified FDA-approved test that has been validated and its performance characteristics determined by the reporting laboratory.  This laboratory is certified under the Clinical Laboratory Improvement Amendments CLIA as qualified to perform high complexity clinical laboratory testing.    CIPROFLOXACIN  Value in next row Sensitive      4 SENSITIVEThis is a modified FDA-approved test that has been validated and its performance characteristics determined by the reporting laboratory.  This laboratory is  certified under the Clinical Laboratory Improvement Amendments CLIA as qualified to perform high complexity clinical laboratory testing.    GENTAMICIN  Value in next row Sensitive      4 SENSITIVEThis is a modified FDA-approved test that has been validated and its performance characteristics determined by the reporting laboratory.  This laboratory is certified under the Clinical Laboratory Improvement Amendments CLIA as qualified to perform high complexity clinical laboratory testing.    NITROFURANTOIN Value in next row Intermediate      4 SENSITIVEThis is a modified FDA-approved test that has been validated and its performance characteristics determined by the reporting laboratory.  This laboratory is certified under the Clinical Laboratory Improvement Amendments CLIA as qualified to perform high complexity clinical laboratory testing.    TRIMETH /SULFA  Value in next row Sensitive      4 SENSITIVEThis is a modified FDA-approved test that has been validated and its performance characteristics determined by the reporting laboratory.  This laboratory is certified under the Clinical Laboratory Improvement Amendments CLIA as qualified to perform high complexity clinical laboratory testing.    AMPICILLIN /SULBACTAM Value in next row Sensitive      4 SENSITIVEThis is a modified FDA-approved test that has been validated and its performance characteristics determined by the reporting laboratory.  This  laboratory is certified under the Clinical Laboratory Improvement Amendments CLIA as qualified to perform high complexity clinical laboratory testing.    PIP/TAZO Value in next row Sensitive      <=4 SENSITIVEThis is a modified FDA-approved test that has been validated and its performance characteristics determined by the reporting laboratory.  This laboratory is certified under the Clinical Laboratory Improvement Amendments CLIA as qualified to perform high complexity clinical laboratory testing.    MEROPENEM Value in next row Sensitive      <=4 SENSITIVEThis is a modified FDA-approved test that has been validated and its performance characteristics determined by the reporting laboratory.  This laboratory is certified under the Clinical Laboratory Improvement Amendments CLIA as qualified to perform high complexity clinical laboratory testing.    * 30,000 COLONIES/mL KLEBSIELLA PNEUMONIAE  Culture, blood (Routine X 2) w Reflex to ID Panel     Status: None (Preliminary result)   Collection Time: 02/14/24  3:15 PM   Specimen: BLOOD  Result Value Ref Range Status   Specimen Description BLOOD SITE NOT SPECIFIED  Final   Special Requests   Final    BOTTLES DRAWN AEROBIC AND ANAEROBIC Blood Culture adequate volume   Culture   Final    NO GROWTH 2 DAYS Performed at [facility], [address], [place], [place] [address]    Report Status PENDING  Incomplete  Culture, blood (Routine X 2) w Reflex to ID Panel     Status: None (Preliminary result)   Collection Time: 02/14/24  3:18 PM   Specimen: BLOOD  Result Value Ref Range Status   Specimen Description BLOOD SITE NOT SPECIFIED  Final   Special Requests   Final    BOTTLES DRAWN AEROBIC AND ANAEROBIC Blood Culture adequate volume   Culture   Final    NO GROWTH 2 DAYS Performed at [facility], [address] [place], [place] [address]    Report Status PENDING  Incomplete    Time coordinating discharge: 45 minutes  Signed: Zayvier Caravello  [facility] 02/16/2024, 11:23 AM

## 2024-02-19 ENCOUNTER — Telehealth: Payer: Self-pay

## 2024-02-19 ENCOUNTER — Telehealth: Payer: Self-pay | Admitting: *Deleted

## 2024-02-19 LAB — CULTURE, BLOOD (ROUTINE X 2)
Culture: NO GROWTH
Culture: NO GROWTH
Special Requests: ADEQUATE
Special Requests: ADEQUATE

## 2024-02-19 NOTE — Transitions of Care (Post Inpatient/ED Visit) (Signed)
   02/19/2024  Name: Evan [name] MRN: [id number] DOB: 1927-05-02  Today's TOC FU Call Status: Today's TOC FU Call Status:: Successful TOC FU Call Completed TOC FU Call Complete Date: 02/19/24  Patient's Name and Date of Birth confirmed. Name, DOB (spoke with daughter Evan [name] DPR, HIPAA verified) Spoke with patient's daughter Evan [name] who reports [facility] has services in place, saw pt in the home this past weekend.  Transition Care Management Follow-up Telephone Call Date of Discharge: 02/16/24 Discharge Facility: [name]) Type of Discharge: Inpatient Admission Primary Inpatient Discharge Diagnosis:: Acute hypoxic respiratory failure Any questions or concerns?: No  Items Reviewed: Did you receive and understand the discharge instructions provided?: Yes  Medications Reviewed Today:  No medications reviewed, pt has hospice in place at home Medications Reviewed Today   Medications were not reviewed in this encounter     Mliss Creed [facility], BSN RN Care Manager/ Transition of Care [place] [place] [phone]

## 2024-03-20 ENCOUNTER — Other Ambulatory Visit (HOSPITAL_COMMUNITY): Payer: Self-pay

## 2024-03-29 ENCOUNTER — Other Ambulatory Visit (HOSPITAL_COMMUNITY): Payer: Self-pay

## 2024-04-05 ENCOUNTER — Other Ambulatory Visit (HOSPITAL_COMMUNITY): Payer: Self-pay

## 2024-04-05 MED ORDER — HYDROXYZINE HCL 10 MG/5ML PO SYRP
10.0000 mg | ORAL_SOLUTION | Freq: Three times a day (TID) | ORAL | 0 refills | Status: DC | PRN
  Filled 2024-04-05: qty 60, 4d supply, fill #0

## 2024-04-05 MED ORDER — HYDROXYZINE HCL 10 MG/5ML PO SYRP
10.0000 mg | ORAL_SOLUTION | Freq: Three times a day (TID) | ORAL | 0 refills | Status: DC | PRN
  Filled 2024-04-05: qty 5, 1d supply, fill #0

## 2024-04-08 ENCOUNTER — Other Ambulatory Visit (HOSPITAL_COMMUNITY): Payer: Self-pay

## 2024-04-10 ENCOUNTER — Other Ambulatory Visit (HOSPITAL_COMMUNITY): Payer: Self-pay

## 2024-04-10 ENCOUNTER — Ambulatory Visit: Admitting: Podiatry

## 2024-04-10 MED ORDER — CIPROFLOXACIN HCL 500 MG PO TABS
500.0000 mg | ORAL_TABLET | Freq: Two times a day (BID) | ORAL | 0 refills | Status: AC
  Filled 2024-04-10: qty 6, 3d supply, fill #0

## 2024-04-11 ENCOUNTER — Other Ambulatory Visit (HOSPITAL_COMMUNITY): Payer: Self-pay

## 2024-04-14 ENCOUNTER — Emergency Department (HOSPITAL_COMMUNITY)

## 2024-04-14 ENCOUNTER — Inpatient Hospital Stay (HOSPITAL_COMMUNITY)
Admission: EM | Admit: 2024-04-14 | Discharge: 2024-05-05 | DRG: 871 | Disposition: E | Attending: Family Medicine | Admitting: Family Medicine

## 2024-04-14 ENCOUNTER — Other Ambulatory Visit: Payer: Self-pay

## 2024-04-14 DIAGNOSIS — Z66 Do not resuscitate: Secondary | ICD-10-CM | POA: Diagnosis present

## 2024-04-14 DIAGNOSIS — Z7901 Long term (current) use of anticoagulants: Secondary | ICD-10-CM

## 2024-04-14 DIAGNOSIS — D539 Nutritional anemia, unspecified: Secondary | ICD-10-CM | POA: Diagnosis present

## 2024-04-14 DIAGNOSIS — Z8673 Personal history of transient ischemic attack (TIA), and cerebral infarction without residual deficits: Secondary | ICD-10-CM

## 2024-04-14 DIAGNOSIS — L89154 Pressure ulcer of sacral region, stage 4: Secondary | ICD-10-CM | POA: Diagnosis not present

## 2024-04-14 DIAGNOSIS — F32A Depression, unspecified: Secondary | ICD-10-CM | POA: Diagnosis present

## 2024-04-14 DIAGNOSIS — Z9049 Acquired absence of other specified parts of digestive tract: Secondary | ICD-10-CM

## 2024-04-14 DIAGNOSIS — I13 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 failure and stage 1 through stage 4 chronic kidney disease, or unspecified chronic kidney disease: Secondary | ICD-10-CM | POA: Diagnosis present

## 2024-04-14 DIAGNOSIS — E785 Hyperlipidemia, unspecified: Secondary | ICD-10-CM | POA: Diagnosis present

## 2024-04-14 DIAGNOSIS — Z7401 Bed confinement status: Secondary | ICD-10-CM

## 2024-04-14 DIAGNOSIS — Z91040 Latex allergy status: Secondary | ICD-10-CM

## 2024-04-14 DIAGNOSIS — I2510 Atherosclerotic heart disease of native coronary artery without angina pectoris: Secondary | ICD-10-CM | POA: Diagnosis present

## 2024-04-14 DIAGNOSIS — Z8249 Family history of ischemic heart disease and other diseases of the circulatory system: Secondary | ICD-10-CM

## 2024-04-14 DIAGNOSIS — Z7189 Other specified counseling: Secondary | ICD-10-CM | POA: Diagnosis not present

## 2024-04-14 DIAGNOSIS — A419 Sepsis, unspecified organism: Secondary | ICD-10-CM | POA: Diagnosis present

## 2024-04-14 DIAGNOSIS — G9341 Metabolic encephalopathy: Secondary | ICD-10-CM | POA: Diagnosis present

## 2024-04-14 DIAGNOSIS — J440 Chronic obstructive pulmonary disease with acute lower respiratory infection: Secondary | ICD-10-CM | POA: Diagnosis present

## 2024-04-14 DIAGNOSIS — Z86711 Personal history of pulmonary embolism: Secondary | ICD-10-CM

## 2024-04-14 DIAGNOSIS — M069 Rheumatoid arthritis, unspecified: Secondary | ICD-10-CM | POA: Diagnosis present

## 2024-04-14 DIAGNOSIS — I739 Peripheral vascular disease, unspecified: Secondary | ICD-10-CM | POA: Diagnosis present

## 2024-04-14 DIAGNOSIS — L0390 Cellulitis, unspecified: Principal | ICD-10-CM | POA: Diagnosis present

## 2024-04-14 DIAGNOSIS — K5641 Fecal impaction: Secondary | ICD-10-CM | POA: Diagnosis present

## 2024-04-14 DIAGNOSIS — J690 Pneumonitis due to inhalation of food and vomit: Secondary | ICD-10-CM | POA: Diagnosis present

## 2024-04-14 DIAGNOSIS — D696 Thrombocytopenia, unspecified: Secondary | ICD-10-CM | POA: Diagnosis present

## 2024-04-14 DIAGNOSIS — Z955 Presence of coronary angioplasty implant and graft: Secondary | ICD-10-CM

## 2024-04-14 DIAGNOSIS — K219 Gastro-esophageal reflux disease without esophagitis: Secondary | ICD-10-CM | POA: Diagnosis present

## 2024-04-14 DIAGNOSIS — F411 Generalized anxiety disorder: Secondary | ICD-10-CM | POA: Diagnosis present

## 2024-04-14 DIAGNOSIS — N1832 Chronic kidney disease, stage 3b: Secondary | ICD-10-CM | POA: Diagnosis present

## 2024-04-14 DIAGNOSIS — Z87891 Personal history of nicotine dependence: Secondary | ICD-10-CM

## 2024-04-14 DIAGNOSIS — F419 Anxiety disorder, unspecified: Secondary | ICD-10-CM | POA: Diagnosis not present

## 2024-04-14 DIAGNOSIS — Z888 Allergy status to other drugs, medicaments and biological substances status: Secondary | ICD-10-CM

## 2024-04-14 DIAGNOSIS — Z96653 Presence of artificial knee joint, bilateral: Secondary | ICD-10-CM | POA: Diagnosis present

## 2024-04-14 DIAGNOSIS — L89159 Pressure ulcer of sacral region, unspecified stage: Principal | ICD-10-CM

## 2024-04-14 DIAGNOSIS — Z1152 Encounter for screening for COVID-19: Secondary | ICD-10-CM | POA: Diagnosis not present

## 2024-04-14 DIAGNOSIS — Z79899 Other long term (current) drug therapy: Secondary | ICD-10-CM

## 2024-04-14 DIAGNOSIS — Z961 Presence of intraocular lens: Secondary | ICD-10-CM | POA: Diagnosis present

## 2024-04-14 DIAGNOSIS — Z947 Corneal transplant status: Secondary | ICD-10-CM

## 2024-04-14 DIAGNOSIS — J9621 Acute and chronic respiratory failure with hypoxia: Secondary | ICD-10-CM | POA: Diagnosis present

## 2024-04-14 DIAGNOSIS — Z85118 Personal history of other malignant neoplasm of bronchus and lung: Secondary | ICD-10-CM

## 2024-04-14 DIAGNOSIS — Z7951 Long term (current) use of inhaled steroids: Secondary | ICD-10-CM

## 2024-04-14 DIAGNOSIS — R54 Age-related physical debility: Secondary | ICD-10-CM | POA: Diagnosis present

## 2024-04-14 DIAGNOSIS — L89150 Pressure ulcer of sacral region, unstageable: Secondary | ICD-10-CM | POA: Diagnosis present

## 2024-04-14 DIAGNOSIS — Z885 Allergy status to narcotic agent status: Secondary | ICD-10-CM

## 2024-04-14 DIAGNOSIS — Z951 Presence of aortocoronary bypass graft: Secondary | ICD-10-CM

## 2024-04-14 DIAGNOSIS — Z22322 Carrier or suspected carrier of Methicillin resistant Staphylococcus aureus: Secondary | ICD-10-CM

## 2024-04-14 DIAGNOSIS — N400 Enlarged prostate without lower urinary tract symptoms: Secondary | ICD-10-CM | POA: Diagnosis present

## 2024-04-14 DIAGNOSIS — Z711 Person with feared health complaint in whom no diagnosis is made: Secondary | ICD-10-CM | POA: Diagnosis not present

## 2024-04-14 DIAGNOSIS — G4700 Insomnia, unspecified: Secondary | ICD-10-CM

## 2024-04-14 DIAGNOSIS — I5032 Chronic diastolic (congestive) heart failure: Secondary | ICD-10-CM | POA: Diagnosis present

## 2024-04-14 DIAGNOSIS — Z515 Encounter for palliative care: Secondary | ICD-10-CM | POA: Diagnosis not present

## 2024-04-14 DIAGNOSIS — Z883 Allergy status to other anti-infective agents status: Secondary | ICD-10-CM

## 2024-04-14 DIAGNOSIS — Z9981 Dependence on supplemental oxygen: Secondary | ICD-10-CM

## 2024-04-14 DIAGNOSIS — I482 Chronic atrial fibrillation, unspecified: Secondary | ICD-10-CM | POA: Diagnosis present

## 2024-04-14 LAB — COMPREHENSIVE METABOLIC PANEL WITH GFR
ALT: 6 U/L (ref 0–44)
AST: 22 U/L (ref 15–41)
Albumin: 2.6 g/dL — ABNORMAL LOW (ref 3.5–5.0)
Alkaline Phosphatase: 186 U/L — ABNORMAL HIGH (ref 38–126)
Anion gap: 11 (ref 5–15)
BUN: 32 mg/dL — ABNORMAL HIGH (ref 8–23)
CO2: 21 mmol/L — ABNORMAL LOW (ref 22–32)
Calcium: 8.8 mg/dL — ABNORMAL LOW (ref 8.9–10.3)
Chloride: 104 mmol/L (ref 98–111)
Creatinine, Ser: 1.15 mg/dL (ref 0.61–1.24)
GFR, Estimated: 59 mL/min — ABNORMAL LOW
Glucose, Bld: 115 mg/dL — ABNORMAL HIGH (ref 70–99)
Potassium: 3.7 mmol/L (ref 3.5–5.1)
Sodium: 136 mmol/L (ref 135–145)
Total Bilirubin: 1.1 mg/dL (ref 0.0–1.2)
Total Protein: 6.3 g/dL — ABNORMAL LOW (ref 6.5–8.1)

## 2024-04-14 LAB — URINALYSIS, W/ REFLEX TO CULTURE (INFECTION SUSPECTED)
Bilirubin Urine: NEGATIVE
Glucose, UA: NEGATIVE mg/dL
Ketones, ur: NEGATIVE mg/dL
Nitrite: NEGATIVE
Protein, ur: 30 mg/dL — AB
Specific Gravity, Urine: 1.014 (ref 1.005–1.030)
pH: 5 (ref 5.0–8.0)

## 2024-04-14 LAB — CBC WITH DIFFERENTIAL/PLATELET
Abs Immature Granulocytes: 0.08 K/uL — ABNORMAL HIGH (ref 0.00–0.07)
Basophils Absolute: 0 K/uL (ref 0.0–0.1)
Basophils Relative: 1 %
Eosinophils Absolute: 0 K/uL (ref 0.0–0.5)
Eosinophils Relative: 0 %
HCT: 27.4 % — ABNORMAL LOW (ref 39.0–52.0)
Hemoglobin: 8.7 g/dL — ABNORMAL LOW (ref 13.0–17.0)
Immature Granulocytes: 1 %
Lymphocytes Relative: 11 %
Lymphs Abs: 0.8 K/uL (ref 0.7–4.0)
MCH: 33.9 pg (ref 26.0–34.0)
MCHC: 31.8 g/dL (ref 30.0–36.0)
MCV: 106.6 fL — ABNORMAL HIGH (ref 80.0–100.0)
Monocytes Absolute: 0.4 K/uL (ref 0.1–1.0)
Monocytes Relative: 5 %
Neutro Abs: 5.5 K/uL (ref 1.7–7.7)
Neutrophils Relative %: 82 %
Platelets: 54 K/uL — ABNORMAL LOW (ref 150–400)
RBC: 2.57 MIL/uL — ABNORMAL LOW (ref 4.22–5.81)
RDW: 18.7 % — ABNORMAL HIGH (ref 11.5–15.5)
WBC: 6.7 K/uL (ref 4.0–10.5)
nRBC: 0 % (ref 0.0–0.2)

## 2024-04-14 LAB — RESP PANEL BY RT-PCR (RSV, FLU A&B, COVID)  RVPGX2
Influenza A by PCR: NEGATIVE
Influenza B by PCR: NEGATIVE
Resp Syncytial Virus by PCR: NEGATIVE
SARS Coronavirus 2 by RT PCR: NEGATIVE

## 2024-04-14 LAB — PROTIME-INR
INR: 1.9 — ABNORMAL HIGH (ref 0.8–1.2)
Prothrombin Time: 22.8 s — ABNORMAL HIGH (ref 11.4–15.2)

## 2024-04-14 LAB — I-STAT CG4 LACTIC ACID, ED: Lactic Acid, Venous: 1.5 mmol/L (ref 0.5–1.9)

## 2024-04-14 MED ORDER — SODIUM CHLORIDE 0.9 % IV SOLN
1.0000 g | Freq: Once | INTRAVENOUS | Status: AC
  Administered 2024-04-14: 1 g via INTRAVENOUS
  Filled 2024-04-14: qty 10

## 2024-04-14 MED ORDER — APIXABAN 2.5 MG PO TABS
2.5000 mg | ORAL_TABLET | Freq: Two times a day (BID) | ORAL | Status: DC
  Administered 2024-04-14: 2.5 mg via ORAL
  Filled 2024-04-14 (×2): qty 1

## 2024-04-14 MED ORDER — ALPRAZOLAM 0.5 MG PO TABS
0.5000 mg | ORAL_TABLET | Freq: Every day | ORAL | Status: DC | PRN
  Administered 2024-04-14: 0.5 mg via ORAL
  Filled 2024-04-14: qty 2

## 2024-04-14 MED ORDER — METOPROLOL TARTRATE 25 MG PO TABS
12.5000 mg | ORAL_TABLET | Freq: Every day | ORAL | Status: DC
  Administered 2024-04-14 – 2024-04-15 (×2): 12.5 mg via ORAL
  Filled 2024-04-14 (×3): qty 1

## 2024-04-14 MED ORDER — ACETAMINOPHEN 325 MG PO TABS
650.0000 mg | ORAL_TABLET | Freq: Four times a day (QID) | ORAL | Status: DC | PRN
  Administered 2024-04-14: 650 mg via ORAL
  Filled 2024-04-14: qty 2

## 2024-04-14 MED ORDER — FLUTICASONE FUROATE-VILANTEROL 100-25 MCG/ACT IN AEPB
1.0000 | INHALATION_SPRAY | Freq: Every day | RESPIRATORY_TRACT | Status: DC
  Filled 2024-04-14 (×2): qty 28

## 2024-04-14 MED ORDER — PANTOPRAZOLE SODIUM 40 MG PO TBEC
40.0000 mg | DELAYED_RELEASE_TABLET | Freq: Every morning | ORAL | Status: DC
  Administered 2024-04-15: 40 mg via ORAL
  Filled 2024-04-14 (×2): qty 1

## 2024-04-14 MED ORDER — ACETAMINOPHEN 650 MG RE SUPP: 650.0000 mg | Freq: Four times a day (QID) | RECTAL | Status: DC | PRN

## 2024-04-14 MED ORDER — DM-GUAIFENESIN ER 30-600 MG PO TB12
1.0000 | ORAL_TABLET | Freq: Two times a day (BID) | ORAL | Status: DC
  Filled 2024-04-14 (×3): qty 1

## 2024-04-14 MED ORDER — ONDANSETRON HCL 4 MG/2ML IJ SOLN: 4.0000 mg | Freq: Four times a day (QID) | INTRAMUSCULAR | Status: DC | PRN

## 2024-04-14 MED ORDER — SENNOSIDES-DOCUSATE SODIUM 8.6-50 MG PO TABS
1.0000 | ORAL_TABLET | Freq: Every day | ORAL | Status: DC
  Filled 2024-04-14 (×2): qty 1

## 2024-04-14 MED ORDER — LACTATED RINGERS IV BOLUS
500.0000 mL | Freq: Once | INTRAVENOUS | Status: AC
  Administered 2024-04-14: 500 mL via INTRAVENOUS

## 2024-04-14 MED ORDER — FLUOXETINE HCL 20 MG PO CAPS
20.0000 mg | ORAL_CAPSULE | Freq: Every day | ORAL | Status: DC
  Administered 2024-04-14 – 2024-04-15 (×2): 20 mg via ORAL
  Filled 2024-04-14 (×3): qty 1

## 2024-04-14 MED ORDER — FOLIC ACID 1 MG PO TABS
1.0000 mg | ORAL_TABLET | Freq: Every day | ORAL | Status: DC
  Administered 2024-04-14 – 2024-04-15 (×2): 1 mg via ORAL
  Filled 2024-04-14 (×3): qty 1

## 2024-04-14 MED ORDER — HYDROXYZINE HCL 10 MG/5ML PO SYRP
10.0000 mg | ORAL_SOLUTION | Freq: Three times a day (TID) | ORAL | Status: DC | PRN
  Filled 2024-04-14 (×2): qty 5

## 2024-04-14 MED ORDER — IOHEXOL 350 MG/ML SOLN
75.0000 mL | Freq: Once | INTRAVENOUS | Status: AC | PRN
  Administered 2024-04-14: 75 mL via INTRAVENOUS

## 2024-04-14 MED ORDER — LINEZOLID 600 MG/300ML IV SOLN
600.0000 mg | Freq: Two times a day (BID) | INTRAVENOUS | Status: DC
  Administered 2024-04-14 – 2024-04-17 (×7): 600 mg via INTRAVENOUS
  Filled 2024-04-14 (×10): qty 300

## 2024-04-14 MED ORDER — ONDANSETRON HCL 4 MG PO TABS: 4.0000 mg | ORAL_TABLET | Freq: Four times a day (QID) | ORAL | Status: DC | PRN

## 2024-04-14 MED ORDER — FERROUS SULFATE 325 (65 FE) MG PO TABS
325.0000 mg | ORAL_TABLET | Freq: Every day | ORAL | Status: DC
  Administered 2024-04-14 – 2024-04-15 (×2): 325 mg via ORAL
  Filled 2024-04-14 (×3): qty 1

## 2024-04-14 MED ORDER — IPRATROPIUM-ALBUTEROL 0.5-2.5 (3) MG/3ML IN SOLN
3.0000 mL | Freq: Four times a day (QID) | RESPIRATORY_TRACT | Status: DC | PRN
  Administered 2024-04-18: 3 mL via RESPIRATORY_TRACT
  Filled 2024-04-14: qty 3

## 2024-04-14 MED ORDER — BISACODYL 5 MG PO TBEC: 5.0000 mg | DELAYED_RELEASE_TABLET | Freq: Every day | ORAL | Status: DC | PRN

## 2024-04-14 MED ORDER — LACTATED RINGERS IV SOLN: INTRAVENOUS | Status: AC

## 2024-04-14 MED ORDER — SENNOSIDES-DOCUSATE SODIUM 8.6-50 MG PO TABS: 1.0000 | ORAL_TABLET | Freq: Every evening | ORAL | Status: DC | PRN

## 2024-04-14 NOTE — ED Triage Notes (Incomplete)
 Pt BIBA from home for fever and GCS - 3 onset - yesterday midday. Per EMS Family withdrew hospice and pt was Aox4 before the fever. 18 RFA, 1000 LR 4 lpm NA baseline.

## 2024-04-14 NOTE — H&P (Incomplete)
 " History and Physical  Evan [name] FMW:[id number] DOB: November 06, 1927 DOA: 04/14/2024  PCP: [name], MD   Chief Complaint: Fever, lethargic  HPI: Evan [name] is a 89 y.o. male with medical history significant for HTN, HLD, CAD s/p CABG, A-fib on Eliquis , left thalamic stroke currently bedbound, pressure ulcer, CKD 3B, diastolic CHF, chronic hypoxic respiratory failure on 2 L [place], remote history of lung cancer, recurrent aspiration, anxiety and depression presented to the ED for evaluation of fever and lethargy. Per family, patient currently receiving home hospice and yesterday, he had a low-grade fever with shaking but was at baseline mental status. This morning, he had a fever of 100.4 and seems to be more lethargic, refusing to eat or drink.  Reports he has also had some congestion and cough recently but no nausea, vomiting, abdominal pain, chest pain, shortness of breath or dysuria. They did increase his oxygen  from 2 L to 4 L [place]. States patient just completed 3 days of ciprofloxacin  and has had some loose stools with it.  at the bedside, patient complains of feeling cold but no other complaint.  ED Course: Initial vitals show Tmax 102.4, RR 20-30s, HR 70-100s, SBP 98-1 30s, SpO2 100% on 4 L . Initial labs significant for bicarb 21, normal renal function, WBC 6.7, Hgb 8.7, albumin 2.6, INR 1.9, lactic acid 1.5, negative flu/RSV/COVID test, UA shows negative nitrite, large leuks, WBC 21-50 and rare bacteria. EKG shows A-fib with prolonged QTc of 562. CXR shows chronic interstitial lung disease with superimposed edema or pneumonia. CT pelvics shows large sacral decubitus ulcer with fat stranding in the subcutaneous tissues and extending to the presacral space consistent with cellulitis but no bony destruction to suggest osteomyelitis, significant stool retention within the rectal wall consistent with fecal impaction. Pt received IV LR 500 cc bolus and IV Rocephin . TRH was consulted for admission.    Review of Systems: Please see HPI for pertinent positives and negatives. A complete 10 system review of systems are otherwise negative.  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   Altered mental status 08/21/2018   Anginal chest pain at rest    Chronic non, controlled on Lorazepam    Anxiety    Arthritis    all over    BPH (benign prostatic hyperplasia)    CAD (coronary artery disease)    Cellulitis of arm, right 06/01/2018   Cellulitis of left axilla    Chronic lower back pain    Complication of anesthesia    problems making water afterwards   Depression    DJD (degenerative joint disease)    Esophageal ulcer without bleeding    bid ppi indefinitely   Fall 07/16/2018   Family history of adverse reaction to anesthesia    children w/PONV   GERD (gastroesophageal reflux disease)    Haemophilus influenzae septicemia (HCC) 03/12/2023   Headache    couple/week maybe (09/04/2015)   Hemochromatosis    Possible, elevated iron  stores, hook-like osteophytes on hand films, normal LFTs   Hepatitis    yellow jaundice as a baby   History of ASCVD    MULTIVESSEL   Hyperlipidemia    Hypertension    Hypotension    Mild aortic stenosis 06/23/2021   Osteoarthritis    Peripheral vascular disease  Pneumonia    several times; got a little now (09/04/2015)   Pulmonary embolism (HCC) 02/02/2018   Rash and nonspecific skin eruption 06/01/2018   Rheumatoid arthritis (HCC)    RSV (respiratory syncytial virus pneumonia) 05/15/2022   Stroke (HCC)    Subdural hematoma (HCC)    small after fall 09/2016-plavix  held, neurosurgery consulted.  Asymptomatic.     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 PO Eliquis    Past Surgical History:   Procedure Laterality Date   ARM SKIN LESION BIOPSY / EXCISION Left 09/02/2015   CATARACT EXTRACTION W/ INTRAOCULAR LENS  IMPLANT, BILATERAL Bilateral    CHOLECYSTECTOMY OPEN     CORNEAL TRANSPLANT Bilateral    one at [facility]; one at [facility]   CORONARY ANGIOPLASTY WITH STENT PLACEMENT     CORONARY ARTERY BYPASS GRAFT  1999   DESCENDING AORTIC ANEURYSM REPAIR W/ STENT     DE stent ostium into the right radial free graft at [facility]-, 02-2007   ESOPHAGOGASTRODUODENOSCOPY N/A 11/09/2014   Procedure: ESOPHAGOGASTRODUODENOSCOPY (EGD);  Surgeon: Norleen Hint, MD;  Location: [facility] ENDOSCOPY;  Service: Endoscopy;  Laterality: N/A;   INGUINAL HERNIA REPAIR     JOINT REPLACEMENT     LEFT HEART CATH AND CORS/GRAFTS ANGIOGRAPHY N/A 06/27/2017   Procedure: LEFT HEART CATH AND CORS/GRAFTS ANGIOGRAPHY;  Surgeon: Burnard Debby LABOR, MD;  Location: [facility] INVASIVE CV LAB;  Service: Cardiovascular;  Laterality: N/A;   NASAL SINUS SURGERY     SHOULDER OPEN ROTATOR CUFF REPAIR Right    TOTAL KNEE ARTHROPLASTY Bilateral    Social History:  reports that he has quit smoking. His smoking use included cigarettes. He has been exposed to tobacco smoke. He has quit using smokeless tobacco.  His smokeless tobacco use included chew. He reports that he does not drink alcohol  and does not use drugs.  Allergies[1]  Family History  Problem Relation Age of Onset   Arthritis-Osteo Sister    Heart attack Brother    Heart attack Other    Cancer Neg Hx      Prior to Admission medications  Medication Sig Start Date End Date Taking? Authorizing Provider  acetaminophen  (TYLENOL ) 325 MG tablet Take 325-650 mg by mouth every other day.   Yes [provider]  ALPRAZolam  (XANAX ) 0.5 MG tablet Take 1 tablet (0.5 mg total) by mouth 2 (two) times daily as needed for anxiety. Patient taking differently: Take 0.5 mg by mouth daily as needed for anxiety. 02/16/24  Yes Dahal, Chapman, MD  apixaban  (ELIQUIS ) 2.5 MG TABS tablet Take 2.5 mg by mouth 2  (two) times daily.   Yes [provider]  budesonide -formoterol  (SYMBICORT ) 160-4.5 MCG/ACT inhaler Inhale 2 puffs into the lungs in the morning and at bedtime. 06/08/23  Yes [name], MD  ciprofloxacin  (CIPRO ) 500 MG tablet Take 1 tablet (500 mg total) by mouth 2 (two) times daily for 3 days. 04/10/24 04/14/24 Yes   CLARITIN  10 MG tablet Take 10 mg by mouth daily as needed. 03/05/24  Yes [provider]  FEROSUL 325 (65 Fe) MG tablet Take 325 mg by mouth daily. 03/21/24  Yes [provider]  FLUoxetine  (PROZAC ) 20 MG capsule TAKE 1 CAPSULE EVERY DAY 01/08/24  Yes [name], MD  folic acid  (FOLVITE ) 1 MG tablet Take 1 mg by mouth daily.   Yes [provider]  hydrOXYzine  (ATARAX ) 10 MG/5ML syrup Take 5 mLs (10 mg total) by mouth every 8 (eight) hours as needed for  itching. 01/29/24  Yes [name], MD  metoprolol  tartrate (LOPRESSOR ) 25 MG tablet TAKE 1/2 TAB DAILY. MAY TAKE AN ADDITIONAL 1/2 TAB DAILY IF NEEDED FOR CHEST PAIN OR ELEVATED BLOOD PRESSURE. 01/30/24  Yes [name], MD  nitroGLYCERIN  (NITROSTAT ) 0.4 MG SL tablet Place 1 tablet (0.4 mg total) under the tongue every 5 (five) minutes as needed for chest pain. 05/16/23  Yes [name], MD  OXYGEN  Inhale 3.5 L/min into the lungs as needed (Shortness of breath).   Yes [provider]  pantoprazole  (PROTONIX ) 40 MG tablet TAKE 1 TABLET EVERY MORNING 09/18/23  Yes [name], MD  Zinc  Oxide (TRIPLE PASTE) 12.8 % ointment Apply topically 4 (four) times daily as needed (skin barrier). 01/22/24  Yes [name], MD    Physical Exam: BP 112/63   Pulse 85   Temp 99 F (37.2 C)   Resp (!) 21   SpO2 94%  General: Acutely ill and weak appearing elderly man laying in bed. No acute distress. HEENT: [place]/AT. Anicteric sclera. Dry mucous membrane. CV: Mild tachycardia. Regular rhythm. Systolic ejection murmur. No LE edema Pulmonary: On 4 L [place]. Mild tachypnea.  Lungs CTAB. Harsh congested lung sounds in the upper airways. No wheezing or rales. Abdominal: Soft, nontender, nondistended. Normal bowel sounds. Extremities: Palpable radial and DP pulses. Normal ROM. Skin: Warm and dry.  5 x 5 cm deep ulcer over the sacrum with surrounding erythema and foul smell but no purulent drainage.  Small ulcer on the lateral right lower leg with surrounding granulation tissue and mild erythema, no purulent drainage. Neuro: Alert, oriented to self and person. Moves all extremities. Normal sensation to light touch. No focal deficit.             Labs on Admission:  Basic Metabolic Panel: Recent Labs  Lab 04/14/24 1242  NA 136  K 3.7  CL 104  CO2 21*  GLUCOSE 115*  BUN 32*  CREATININE 1.15  CALCIUM  8.8*   Liver Function Tests: Recent Labs  Lab 04/14/24 1242  AST 22  ALT 6  ALKPHOS 186*  BILITOT 1.1  PROT 6.3*  ALBUMIN 2.6*   No results for input(s): LIPASE, AMYLASE in the last 168 hours. No results for input(s): AMMONIA in the last 168 hours. CBC: Recent Labs  Lab 04/14/24 1242  WBC 6.7  NEUTROABS 5.5  HGB 8.7*  HCT 27.4*  MCV 106.6*  PLT 54*   Cardiac Enzymes: No results for input(s): CKTOTAL, CKMB, CKMBINDEX, TROPONINI in the last 168 hours. BNP (last 3 results) Recent Labs    09/10/23 0224 09/13/23 1200 02/08/24 1855  BNP 927.6* 1,666.8* 726.5*    ProBNP (last 3 results) No results for input(s): PROBNP in the last 8760 hours.  CBG: No results for input(s): GLUCAP in the last 168 hours.  Radiological Exams on Admission: CT PELVIS W CONTRAST Result Date: 04/14/2024 CLINICAL DATA:  Soft tissue infection EXAM: CT PELVIS WITH CONTRAST TECHNIQUE: Multidetector CT imaging of the pelvis was performed using the standard protocol following the bolus administration of intravenous contrast. RADIATION DOSE REDUCTION: This exam was performed according to the departmental dose-optimization program which includes  automated exposure control, adjustment of the mA and/or kV according to patient size and/or use of iterative reconstruction technique. CONTRAST:  75mL OMNIPAQUE  IOHEXOL  350 MG/ML SOLN COMPARISON:  03/12/2022 FINDINGS: Urinary Tract: Bladder is decompressed with a Foley catheter. Distal ureters are unremarkable. Bowel: No bowel obstruction or ileus. Retained stool within the rectal  vault may reflect fecal impaction. No evidence of stercoral colitis. Vascular/Lymphatic: Atherosclerosis of the aorta and its branches. No pathologic adenopathy. Reproductive: Prostate is unremarkable. Incidental bilateral hydroceles. Other: No free fluid or free intraperitoneal gas. No abdominal wall hernia. Musculoskeletal: Diffuse osteopenia. Sacral decubitus ulcer extending 5.5 cm in craniocaudal length, with gas lucency extending to the dorsal cortex of the sacrococcygeal junction. I do not see any bony destruction to suggest osteomyelitis. Associated fat stranding extends in the presacral space, without fluid collection or abscess. Reconstructed images demonstrate no additional findings. IMPRESSION: 1. Large sacral decubitus ulcer, with fat stranding in the subcutaneous tissues and extending into the presacral space consistent with cellulitis. No bony destruction to suggest osteomyelitis. 2. Significant stool retention within the rectal wall, consistent with fecal impaction. 3.  Aortic Atherosclerosis (ICD10-I70.0). Electronically Signed   By: Ozell Daring M.D.   On: 04/14/2024 17:42   DG Chest Port 1 View Result Date: 04/14/2024 CLINICAL DATA:  Questionable sepsis. EXAM: PORTABLE CHEST 1 VIEW COMPARISON:  Chest radiograph dated 02/21/2024. FINDINGS: There is diffuse chronic intra coarsening in keeping with interstitial lung disease. Scattered areas of faint hazy densities primarily over the right mid to lower lung field may be chronic but suspicious for edema or pneumonia. Small right pleural effusion. No pneumothorax. Stable  cardiomegaly. Median sternotomy wires. No acute osseous pathology. IMPRESSION: 1. Chronic interstitial lung disease with superimposed edema or pneumonia. 2. Small right pleural effusion. Electronically Signed   By: Vanetta Chou M.D.   On: 04/14/2024 14:35   Assessment/Plan Evan [name] is a 89 y.o. male with medical history significant for HTN, HLD, CAD s/p CABG, A-fib on Eliquis , urinary retention s/p chronic Foley, left thalamic stroke currently bedbound, pressure ulcer, CKD 3B, diastolic CHF, chronic hypoxic respiratory failure on 2 L [place], remote history of lung cancer, recurrent aspiration, anxiety and depression presented to the ED for evaluation of fever and lethargy and admitted for sepsis  # Sepsis - Presented with fevers, decreased mentation and lethargy - Findings consistent with worsening wound infection/cellulitis and evidence of possible aspiration pneumonia - Met sepsis criteria with fever, tachypnea, leukocytosis and evidence of cellulitis and pneumonia - Start IV linezolid  and Rocephin  - Continue IV hydration with IVLR @ 150cc/hr - Follow-up blood and urine cultures - Trend CBC, fever curve  # Cellulitis # Sacral wound infection - Patient with history of sacral decubitus ulcer not currently manage in the outpatient due to patient being on hospice presented with worsening wound and lethargic - CT pelvics shows a large sacral decubitus ulcer with fat stranding in the subcutaneous tissues and extending into the presacral space consistent with cellulitis but no osteomyelitis. - IV antibiotics as above - Wound care consult  # Pneumonia - Patient with history of recurrent aspirations presenting with recent cough, congestion and found to have increased O2 requirement - CXR shows signs of possible pneumonia, likely aspiration due to history of being bedbound - IV antibiotics as above - Mucinex  DM and as needed Robitussin for cough - Aspiration precaution  # Acute on chronic  hypoxic respiratory failure # COPD - Patient with increased O2 requirement from baseline 2 L [place] to 4 L [place] - This is likely in the setting of pneumonia - Continue home bronchodilator, start as needed DuoNeb - Continue supplemental O2, wean as able  # Chronic urinary retention - Status post chronic Foley, per family, fole changed every month by hospice, last changed on 03/17/24 - Urinalysis shows some signs of infection, improved  from 2 months ago, likely colonized - Consider exchange of Foley catheter prior to discharge  # Acute metabolic encephalopathy - Patient with lethargy and decreased responsiveness in the setting of sepsis - Alert, awake, oriented to self and person - Delirium precaution  # Fecal impaction - CT of the pelvis showed significant stool retention within the rectal wall consistent with fecal impaction - Patient had a good bowel movement during my assessment - Start daily MiraLAX  and Senokot-S, escalate as needed  # A-fib - EKG shows A-fib, HR stable - Continue Eliquis  and Lopressor  - Trend and replete electrolytes  # Chronic diastolic HF # Hypertension - Last TTE in 11/25 shows EF 55-60%, moderately dilated LA, severely dilated RA and moderate TVR - BP initially soft but stable with SBP in the 110-130s  # Macrocytic anemia - Hgb of 8.7, around baseline of 8-9 - Continue iron  and folate supplementation - Transfuse for Hgb >7  # Chronic thrombocytopenia - Platelet of 54, stable compared to recent baseline of 40 to 50s - Trend CBC  # QTc prolongation - EKG on admission shows prolonged QTc of 562 - Avoid QTc prolonging meds - Telemetry  # Hx of depression - Continue Prozac   # GAD - Continue as needed Xanax   # GERD - Continue Protonix    DVT prophylaxis: Eliquis     Code Status: Limited: Do not attempt resuscitation (DNR) -DNR-LIMITED -Do Not Intubate/DNI   Consults called: None  Family Communication: Discussed findings/results and plan for  admission with family at bedside  Severity of Illness: The appropriate patient status for this patient is INPATIENT. Inpatient status is judged to be reasonable and necessary in order to provide the required intensity of service to ensure the patient's safety. The patient's presenting symptoms, physical exam findings, and initial radiographic and laboratory data in the context of their chronic comorbidities is felt to place them at high risk for further clinical deterioration. Furthermore, it is not anticipated that the patient will be medically stable for discharge from the hospital within 2 midnights of admission.   * I certify that at the point of admission it is my clinical judgment that the patient will require inpatient hospital care spanning beyond 2 midnights from the point of admission due to high intensity of service, high risk for further deterioration and high frequency of surveillance required.*  Level of care: Telemetry   I personally spent a total of 75 minutes in the care of the patient today including preparing to see the patient, getting/reviewing separately obtained history, performing a medically appropriate exam/evaluation, placing orders, documenting clinical information in the EHR, and communicating results.  Lou Claretta HERO, MD 04/15/2024, 12:54 AM [facility] Pager: [phone] Isaiah 41:10   If 7PM-7AM, please contact night-coverage [email or web address] Password [email or web address]     [1]  Allergies Allergen Reactions   Feldene [Piroxicam] Rash    Blistering rash     Neomycin -Polymyxin-Hc Itching   Statins Other (See Comments)    Myalgias   Latex Rash    Band Aid   Tequin [Gatifloxacin] Swelling and Anxiety   Valium [Diazepam] Other (See Comments)    Dizziness    Vibramycin  [Doxycycline ] Itching and Nausea Only   "

## 2024-04-14 NOTE — ED Provider Notes (Signed)
 Patient was initially seen by Dr. Doretha.  Please see her note.  Patient presented to the ED with symptoms concerning for possible evolving sepsis.  Patient's urinalysis did suggest the possibility of UTI.  Chest x-ray also suggested possible pneumonia.  CT scan was pending at the time of shift change.  He did have a large decubitus ulcer noted.  CT scan does show findings concerning for cellulitis but no evidence of abscess.  Patient also has fecal impaction.  Will consult the medical service for admission  Case discussed with Dr Lou Randol Simmonds, MD 04/14/24 [phone]

## 2024-04-14 NOTE — Progress Notes (Signed)
 Evan [name] ED [facility] Liaison Note    This patient is a current hospice patient with [facility], admitted on 11.14.25 with a terminal diagnosis of Cerebrovascular Disease. Please see media tab for detailed hospice report.    Patient is a DNR and daughter Evan [name] [phone] is the patient's primary caregiver.   We will continue to follow for any discharge planning needs and to coordinate continuation of hospice care.   Please don't hesitate to call with any Hospice related questions or concerns.   Thank you for the opportunity to participate in this patient's care.   Nat Babe, BSN, RN [facility] [phone]

## 2024-04-14 NOTE — ED Provider Notes (Signed)
 " [facility] Provider Note   CSN: [id number] Arrival date & time: 04/14/24  1234     Patient presents with: Code Sepsis   Evan [name] is a 89 y.o. male.   Pt is a 89 y.o. male with PMH significant for HTN, HLD, CAD s/p CABG, A-fib on Eliquis , left thalamic stroke, diastolic CHF, remote history of lung cancer, recurrent aspiration who had been receiving home hospice and presenting today with over 24 hours of fever.  Family reports yesterday he had a low-grade fever but was still at his baseline of A&O x 4.  However this morning patient's temperature has been higher he has been lethargic, altered and refusing to eat or drink.  He has been bedbound for a while.  They do not note any significant cough, vomiting, diarrhea.  There was no report of patient having any pain.  EMS reports that they have given fluid and route but blood pressure has remained in the low 100s.  He is on 2 L of oxygen  all the time and family had turned it up to 4 sats have been in the high 90s and EMS did not alter.  The history is provided by the EMS personnel, medical records and a caregiver.       Prior to Admission medications  Medication Sig Start Date End Date Taking? Authorizing Provider  acetaminophen  (TYLENOL ) 325 MG tablet Take 325-650 mg by mouth every other day.    [provider]  ALPRAZolam  (XANAX ) 0.5 MG tablet Take 1 tablet (0.5 mg total) by mouth 2 (two) times daily as needed for anxiety. 02/16/24   Arlice Reichert, MD  ALPRAZolam  (XANAX ) 0.5 MG tablet Take 1 tablet (0.5 mg total) by mouth 2 (two) times daily as needed for anxiety. 02/16/24   Arlice Reichert, MD  budesonide -formoterol  (SYMBICORT ) 160-4.5 MCG/ACT inhaler Inhale 2 puffs into the lungs in the morning and at bedtime. 06/08/23   [name], MD  ciprofloxacin  (CIPRO ) 500 MG tablet Take 1 tablet (500 mg total) by mouth 2 (two) times daily for 3 days. 04/10/24 04/14/24    FLUoxetine  (PROZAC )  20 MG capsule TAKE 1 CAPSULE EVERY DAY 01/08/24   [name], MD  hydrOXYzine  (ATARAX ) 10 MG/5ML syrup Take 5 mLs (10 mg total) by mouth every 8 (eight) hours as needed for itching. 01/29/24   [name], MD  hydrOXYzine  (ATARAX ) 10 MG/5ML syrup Take 5 mLs (10 mg total) by mouth every 8 (eight) hours as needed. 04/04/24     hydrOXYzine  (ATARAX ) 10 MG/5ML syrup Take 5 mLs (10 mg total) by mouth every 8 (eight) hours as needed. 04/05/24     Melatonin 5 MG CAPS Take 5 mg by mouth at bedtime.    [provider]  metoprolol  tartrate (LOPRESSOR ) 25 MG tablet TAKE 1/2 TAB DAILY. MAY TAKE AN ADDITIONAL 1/2 TAB DAILY IF NEEDED FOR CHEST PAIN OR ELEVATED BLOOD PRESSURE. 01/30/24   [name], MD  nitroGLYCERIN  (NITROSTAT ) 0.4 MG SL tablet Place 1 tablet (0.4 mg total) under the tongue every 5 (five) minutes as needed for chest pain. 05/16/23   [name], MD  OXYGEN  Inhale 2 L/min into the lungs as needed (Shortness of breath).    [provider]  pantoprazole  (PROTONIX ) 40 MG tablet TAKE 1 TABLET EVERY MORNING 09/18/23   [name], MD  senna-docusate (STIMULANT LAXATIVE) 8.6-50 MG tablet Take 2 tablets by mouth 2 (two) times daily as needed  for constipation 07/21/22     tamsulosin  (FLOMAX ) 0.4 MG CAPS capsule TAKE 1 CAPSULE EVERY DAY WITH SUPPER 11/16/23   [name], MD  Zinc  Oxide (TRIPLE PASTE) 12.8 % ointment Apply topically 4 (four) times daily as needed (skin barrier). 01/22/24   [name], MD    Allergies: Feldene [piroxicam], Neomycin -polymyxin-hc, Statins, Latex, Tequin [gatifloxacin], Valium [diazepam], and Vibramycin  [doxycycline ]    Review of Systems  Updated Vital Signs BP (!) 96/50 (BP Location: Right Arm)   Pulse 75   Temp 98.1 F (36.7 C) (Axillary)   Resp 12   SpO2 100%   Physical Exam Vitals and nursing note reviewed.  Constitutional:      General: He is not in acute distress.    Appearance: He is well-developed.      Comments: lethargic  HENT:     Head: Normocephalic and atraumatic.     Mouth/Throat:     Mouth: Mucous membranes are dry.  Eyes:     Conjunctiva/sclera: Conjunctivae normal.     Pupils: Pupils are equal, round, and reactive to light.  Cardiovascular:     Rate and Rhythm: Normal rate and regular rhythm.     Pulses: Normal pulses.     Heart sounds: No murmur heard. Pulmonary:     Effort: Pulmonary effort is normal. No respiratory distress.     Breath sounds: Normal breath sounds. No wheezing or rales.  Abdominal:     General: There is no distension.     Palpations: Abdomen is soft.     Tenderness: There is no abdominal tenderness. There is no guarding or rebound.  Musculoskeletal:        General: No tenderness. Normal range of motion.     Cervical back: Normal range of motion and neck supple.     Right lower leg: No edema.     Left lower leg: No edema.     Comments: 5 x 5 cm wound over the coccyx which is deep and has some tunneling, foul smell and mild erythema around the edges of the wound  Skin:    General: Skin is warm and dry.     Findings: No erythema or rash.     Comments: Hot to the touch  Neurological:     Comments: Patient will only minimally open his eyes when you speak to him.  He will not answer questions at this time  Psychiatric:        Behavior: Behavior normal.     (all labs ordered are listed, but only abnormal results are displayed) Labs Reviewed  COMPREHENSIVE METABOLIC PANEL WITH GFR - Abnormal; Notable for the following components:      Result Value   CO2 21 (*)    Glucose, Bld 115 (*)    BUN 32 (*)    Calcium  8.8 (*)    Total Protein 6.3 (*)    Albumin 2.6 (*)    Alkaline Phosphatase 186 (*)    GFR, Estimated 59 (*)    All other components within normal limits  CBC WITH DIFFERENTIAL/PLATELET - Abnormal; Notable for the following components:   RBC 2.57 (*)    Hemoglobin 8.7 (*)    HCT 27.4 (*)    MCV 106.6 (*)    RDW 18.7 (*)    Platelets 54  (*)    Abs Immature Granulocytes 0.08 (*)    All other components within normal limits  PROTIME-INR - Abnormal; Notable for the following components:   Prothrombin Time  22.8 (*)    INR 1.9 (*)    All other components within normal limits  URINALYSIS, W/ REFLEX TO CULTURE (INFECTION SUSPECTED) - Abnormal; Notable for the following components:   APPearance HAZY (*)    Hgb urine dipstick SMALL (*)    Protein, ur 30 (*)    Leukocytes,Ua LARGE (*)    Bacteria, UA RARE (*)    All other components within normal limits  RESP PANEL BY RT-PCR (RSV, FLU A&B, COVID)  RVPGX2  CULTURE, BLOOD (ROUTINE X 2)  CULTURE, BLOOD (ROUTINE X 2)  URINE CULTURE  I-STAT CG4 LACTIC ACID, ED  I-STAT CG4 LACTIC ACID, ED    EKG: EKG Interpretation Date/Time:  Sunday April 14 2024 13:27:35 EST Ventricular Rate:  75 PR Interval:    QRS Duration:  117 QT Interval:  503 QTC Calculation: 562 R Axis:   -19  Text Interpretation: Atrial fibrillation Nonspecific intraventricular conduction delay Consider anterior infarct Abnrm T, consider ischemia, anterolateral lds No significant change since last tracing Confirmed by Doretha Folks ([id number]) on 04/14/2024 2:06:50 PM  Radiology: [facility] Chest Port 1 View Result Date: 04/14/2024 CLINICAL DATA:  Questionable sepsis. EXAM: PORTABLE CHEST 1 VIEW COMPARISON:  Chest radiograph dated 02/21/2024. FINDINGS: There is diffuse chronic intra coarsening in keeping with interstitial lung disease. Scattered areas of faint hazy densities primarily over the right mid to lower lung field may be chronic but suspicious for edema or pneumonia. Small right pleural effusion. No pneumothorax. Stable cardiomegaly. Median sternotomy wires. No acute osseous pathology. IMPRESSION: 1. Chronic interstitial lung disease with superimposed edema or pneumonia. 2. Small right pleural effusion. Electronically Signed   By: Vanetta Chou M.D.   On: 04/14/2024 14:35     Procedures   Medications Ordered  in the ED  lactated ringers  infusion ( Intravenous New Bag/Given 04/14/24 1307)  lactated ringers  bolus 500 mL (has no administration in time range)  cefTRIAXone  (ROCEPHIN ) 1 g in sodium chloride  0.9 % 100 mL IVPB (has no administration in time range)                                    Medical Decision Making Amount and/or Complexity of Data Reviewed Independent Historian: EMS External Data Reviewed: notes. Labs: ordered. Decision-making details documented in ED Course. Radiology: ordered and independent interpretation performed. Decision-making details documented in ED Course. ECG/medicine tests: ordered and independent interpretation performed. Decision-making details documented in ED Course.  Risk Prescription drug management.   Pt with multiple medical problems and comorbidities and presenting today with a complaint that caries a high risk for morbidity and mortality.  Here today with the above complaints.  Concern for sepsis.  At this time unknown source as patient is not having significant cough, vomiting, diarrhea.  No obvious signs of cellulitis.  Patient does have a history of UTI, bacteremia and aspiration pneumonia.  Last hospitalized for this in November when he was positive for Klebsiella, Enterococcus and strep.  Patient is no longer on any antibiotics.  Was an aspiration risks but is still eating a regular diet and family and patient were willing to accept the risk.  Patient was receiving hospice but family had informed paramedics that they fired hospice yesterday.  Undifferentiated sepsis order set was initiated.  Family is now present and reports that over the last 2 weeks he has had a worsening wound on his coccyx which is now foul-smelling.  They had  set up wound care but so far it is just being packed with honey.  They report his temperature today was 100.4.  He has had a cough but it is been chronic and they deny any new cough.  I independently interpreted patient's labs  and EKG.  EKG without acute findings, lactic acid is within normal limits, CMP is normal, UA shows large leukocytes but rare bacteria and patient is a chronic Foley patient so low suspicion that that is the source of his temperature and lower suspicion for UTI at this time.  Patient's flu test, CBC are still pending.  Patient does have a very large wound on his coccyx which is stage III or IV.  Will do a CT to ensure no worsening infection or osteomyelitis. I have independently visualized and interpreted pt's images today.  Chest x-ray with findings of patchiness on both sides..  Radiology reports chronic interstitial lung but concern for possible superimposed pneumonia.  Family did notice that patient appeared short of breath yesterday and did increase his oxygen  to 4 L.  Patient was covered with Rocephin  and azithromycin .     Final diagnoses:  None    ED Discharge Orders     None          Doretha Folks, MD 04/14/24 1448  "

## 2024-04-15 DIAGNOSIS — I482 Chronic atrial fibrillation, unspecified: Secondary | ICD-10-CM

## 2024-04-15 DIAGNOSIS — I5032 Chronic diastolic (congestive) heart failure: Secondary | ICD-10-CM

## 2024-04-15 DIAGNOSIS — J9621 Acute and chronic respiratory failure with hypoxia: Secondary | ICD-10-CM

## 2024-04-15 DIAGNOSIS — K5641 Fecal impaction: Secondary | ICD-10-CM

## 2024-04-15 DIAGNOSIS — A419 Sepsis, unspecified organism: Secondary | ICD-10-CM | POA: Diagnosis not present

## 2024-04-15 DIAGNOSIS — G9341 Metabolic encephalopathy: Secondary | ICD-10-CM

## 2024-04-15 DIAGNOSIS — F419 Anxiety disorder, unspecified: Secondary | ICD-10-CM

## 2024-04-15 LAB — BASIC METABOLIC PANEL WITH GFR
Anion gap: 12 (ref 5–15)
BUN: 31 mg/dL — ABNORMAL HIGH (ref 8–23)
CO2: 21 mmol/L — ABNORMAL LOW (ref 22–32)
Calcium: 8.8 mg/dL — ABNORMAL LOW (ref 8.9–10.3)
Chloride: 105 mmol/L (ref 98–111)
Creatinine, Ser: 1.19 mg/dL (ref 0.61–1.24)
GFR, Estimated: 57 mL/min — ABNORMAL LOW
Glucose, Bld: 129 mg/dL — ABNORMAL HIGH (ref 70–99)
Potassium: 4.2 mmol/L (ref 3.5–5.1)
Sodium: 138 mmol/L (ref 135–145)

## 2024-04-15 LAB — BLOOD CULTURE ID PANEL (REFLEXED) - BCID2

## 2024-04-15 LAB — CBC
HCT: 27.4 % — ABNORMAL LOW (ref 39.0–52.0)
Hemoglobin: 8.8 g/dL — ABNORMAL LOW (ref 13.0–17.0)
MCH: 34.2 pg — ABNORMAL HIGH (ref 26.0–34.0)
MCHC: 32.1 g/dL (ref 30.0–36.0)
MCV: 106.6 fL — ABNORMAL HIGH (ref 80.0–100.0)
Platelets: 44 K/uL — ABNORMAL LOW (ref 150–400)
RBC: 2.57 MIL/uL — ABNORMAL LOW (ref 4.22–5.81)
RDW: 18.8 % — ABNORMAL HIGH (ref 11.5–15.5)
WBC: 6.7 K/uL (ref 4.0–10.5)
nRBC: 0.3 % — ABNORMAL HIGH (ref 0.0–0.2)

## 2024-04-15 LAB — URINE CULTURE: Culture: NO GROWTH

## 2024-04-15 LAB — PHOSPHORUS: Phosphorus: 3.9 mg/dL (ref 2.5–4.6)

## 2024-04-15 LAB — MAGNESIUM: Magnesium: 1.7 mg/dL (ref 1.7–2.4)

## 2024-04-15 MED ORDER — GUAIFENESIN 100 MG/5ML PO LIQD: 5.0000 mL | ORAL | Status: DC | PRN

## 2024-04-15 MED ORDER — SODIUM CHLORIDE 0.9 % IV SOLN
1.0000 g | INTRAVENOUS | Status: DC
  Administered 2024-04-15 – 2024-04-17 (×3): 1 g via INTRAVENOUS
  Filled 2024-04-15 (×3): qty 10

## 2024-04-15 MED ORDER — COLLAGENASE 250 UNIT/GM EX OINT
TOPICAL_OINTMENT | Freq: Every day | CUTANEOUS | Status: DC
  Filled 2024-04-15: qty 30

## 2024-04-15 MED ORDER — POLYETHYLENE GLYCOL 3350 17 G PO PACK
17.0000 g | PACK | Freq: Every day | ORAL | Status: DC
  Filled 2024-04-15 (×2): qty 1

## 2024-04-15 MED ORDER — SODIUM CHLORIDE 0.9 % IV SOLN: INTRAVENOUS | Status: AC

## 2024-04-15 MED ORDER — ZINC OXIDE 40 % EX OINT
TOPICAL_OINTMENT | Freq: Two times a day (BID) | CUTANEOUS | Status: DC
  Administered 2024-04-17: 1 via TOPICAL
  Filled 2024-04-15 (×2): qty 57

## 2024-04-15 MED ORDER — CHLORHEXIDINE GLUCONATE CLOTH 2 % EX PADS
6.0000 | MEDICATED_PAD | Freq: Every day | CUTANEOUS | Status: DC
  Administered 2024-04-16 – 2024-04-18 (×3): 6 via TOPICAL

## 2024-04-15 MED ORDER — DAKINS (1/4 STRENGTH) 0.125 % EX SOLN
Freq: Every day | CUTANEOUS | Status: DC
  Filled 2024-04-15: qty 473

## 2024-04-15 MED ORDER — GUAIFENESIN-DM 100-10 MG/5ML PO SYRP
5.0000 mL | ORAL_SOLUTION | ORAL | Status: DC | PRN
  Administered 2024-04-16: 5 mL via ORAL
  Filled 2024-04-15: qty 5

## 2024-04-15 NOTE — Progress Notes (Signed)
 [name]  [facility] Liaison Note   Evan [name] is a current hospice patient with a terminal diagnosis of cerebrovascular disease. He was brought to the ED after being lethargic and having a fever for over 24 hours. He was admitted for sepsis, wound cellulitis and aspiration pneumonia. Per Dr. Jeryl with [facility], this is a related hospital admission.    [facility] Liaison met with pts wife at bedside. Patient appeared to be sleeping soundly. Spoke with wife about hospice revocation. She previously stated that she is not interested in the pt continuing with hospice anymore. She told clinical research associate that she has not decided what to do yet and that she did not say that. Let her know that he is still currently a patient of [facility] and we will check on him daily. She verbally understood and thanked clinical research associate.    Patient remains inpatient appropriate due to need for IV antibiotics.   Vital Signs: 97.9, 65, 17,97/56   SPO2 100% on 4L [place]   I&O:  not documented   Abnormal Labs:  04/15/24  CO2: 21(L) HGB 8.8 HCT: 27.4(L) Glucose 129 (H) BUN 31 (H) GFR 57 (L) Platelets 44 (L)   Diagnostics:   CT pelvis 1.11.2026: showed large sacral decubitus ulcer, no osteomyelitis. Significant stool consistent with fecal impaction.   IV/PRN Meds:  Cefepime  1g IV Q24H   Problem list as per MD note, [name], MD  04/15/2024 Assessment & Plan: Principal Problem:   Sepsis (HCC) Active Problems:   Atrial fibrillation, chronic (HCC)   Acute metabolic encephalopathy   Fecal impaction (HCC)   Anxiety and depression   Chronic diastolic HF (heart failure) (HCC)   Acute on chronic hypoxic respiratory failure (HCC)   Sepsis: Multi-Factorial: Patient presented with fevers, tachycardia, leukocytosis, decreased mentation and lethargy. Findings consistent with worsening wound infection / cellulitis and evidence of possible aspiration pneumonia Initiated on IV linezolid  and  Rocephin  Continue IV hydration with IVLR @ 150cc/hr Follow-up blood and urine cultures Trend CBC, fever curve.   Cellulitis Sacral wound infection Patient with history of sacral decubitus ulcer, not currently managed in the outpatient due to patient being on hospice presented with worsening wound and lethargic CT pelvics shows a large sacral decubitus ulcer with fat stranding in the subcutaneous tissues and extending into the presacral space consistent with cellulitis but no osteomyelitis. Empiric antibiotics as above. Wound care consult   Aspiration Pneumonia: Patient with history of recurrent aspirations presenting with recent cough, congestion and found to have increased O2 requirement CXR shows signs of possible pneumonia, likely aspiration due to history of being bed bound. Continue IV antibiotics as above. Mucinex  DM and as needed Robitussin for cough. Aspiration precaution.   Acute on chronic hypoxic respiratory failure: COPD: Patient with increased O2 requirement from baseline 2 L [place] to 4 L [place] This is likely in the setting of pneumonia. Continue home bronchodilator, start as needed DuoNeb Continue supplemental O2, wean as able   Chronic urinary retention: Status post chronic Foley, per family, foley changed every month by hospice, last changed on 03/17/24 Urinalysis shows some signs of infection, improved from 2 months ago, likely colonized Consider exchange of Foley catheter prior to discharge.   Acute metabolic encephalopathy: Patient with lethargy and decreased responsiveness in the setting of sepsis. Alert, Arousable , oriented to self and person. Delirium precaution   Fecal impaction CT of the pelvis showed significant stool retention within the rectal wall consistent with fecal impaction Patient had a good  bowel movement during my assessment. Continue daily MiraLAX  and Senokot-S, escalate as needed   A-fib: EKG shows A-fib, HR stable Continue Eliquis  and  Lopressor  Trend and replete electrolytes   Chronic diastolic HF: Hypertension: Last TTE in 11/25 shows EF 55-60%, moderately dilated LA, severely dilated RA and moderate TVR BP initially soft but stable with SBP in the 110-130s   Macrocytic anemia Hgb of 8.7, around baseline of 8-9. Continue iron  and folate supplementation Transfuse for Hgb >7      Disposition Plan: This will depend on hospital course    Discharge Planning: Ongoing   Family Contact: spoke with wife at bedside   IDT: updated   Goals of Care: DNR     Should patient need ambulance transfer at discharge - please use [facility]) as they contract this service for our active hospice patients.   Greig Basket, BSN, RN [facility] Liaison [phone]

## 2024-04-15 NOTE — Progress Notes (Signed)
" PROGRESS NOTE    Evan [name]  FMW:[id number] DOB: 11-21-1927 DOA: 04/14/2024 PCP: [name], MD   Brief Narrative:  This 89 y.o. male with medical history significant for HTN, HLD, CAD s/p CABG, A-fib on Eliquis , left thalamic stroke currently bedbound, pressure ulcer, CKD 3B, diastolic CHF, chronic hypoxic respiratory failure on 2 L [place], remote history of lung cancer, recurrent aspiration, anxiety and depression presented to the ED for evaluation of fever and lethargy. Per family, patient currently receiving home hospice and yesterday, he had a low-grade fever with shaking but was at baseline mental status. This morning, he had a fever of 100.4 and seems to be more lethargic, refusing to eat or drink.  Family reports he has also had some congestion and cough recently  They did increase his oxygen  from 2 L to 4 L [place]. States patient just completed 3 days of ciprofloxacin  and has had some loose stools with it.  at the bedside, patient complains of feeling cold but no other complaints.  He was febrile, tachycardic, tachypneic , hypotensive and hypoxic in the ED.  UA consistent with UTI.  Chest x-ray shows chronic interstitial lung disease with superimposed edema or pneumonia. CT pelvics shows large sacral decubitus ulcer with fat stranding in the subcutaneous tissues and extending to the presacral space consistent with cellulitis but no bony destruction to suggest osteomyelitis, significant stool retention within the rectal wall consistent with fecal impaction.  Patient was admitted for further evaluation and started on empiric antibiotics.  Assessment & Plan:   Principal Problem:   Sepsis (HCC) Active Problems:   Atrial fibrillation, chronic (HCC)   Acute metabolic encephalopathy   Fecal impaction (HCC)   Anxiety and depression   Chronic diastolic HF (heart failure) (HCC)   Acute on chronic hypoxic respiratory failure (HCC)  Sepsis: Multi-Factorial: Patient presented with fevers,  tachycardia, leukocytosis, decreased mentation and lethargy. Findings consistent with worsening wound infection / cellulitis and evidence of possible aspiration pneumonia Initiated on IV linezolid  and Rocephin  Continue IV hydration with IVLR @ 150cc/hr Follow-up blood and urine cultures Trend CBC, fever curve.   Cellulitis Sacral wound infection Patient with history of sacral decubitus ulcer, not currently managed in the outpatient due to patient being on hospice presented with worsening wound and lethargic CT pelvics shows a large sacral decubitus ulcer with fat stranding in the subcutaneous tissues and extending into the presacral space consistent with cellulitis but no osteomyelitis. Empiric antibiotics as above. Wound care consult   Aspiration Pneumonia: Patient with history of recurrent aspirations presenting with recent cough, congestion and found to have increased O2 requirement CXR shows signs of possible pneumonia, likely aspiration due to history of being bed bound. Continue IV antibiotics as above. Mucinex  DM and as needed Robitussin for cough. Aspiration precaution.   Acute on chronic hypoxic respiratory failure: COPD: Patient with increased O2 requirement from baseline 2 L [place] to 4 L [place] This is likely in the setting of pneumonia. Continue home bronchodilator, start as needed DuoNeb Continue supplemental O2, wean as able   Chronic urinary retention: Status post chronic Foley, per family, foley changed every month by hospice, last changed on 03/17/24 Urinalysis shows some signs of infection, improved from 2 months ago, likely colonized Consider exchange of Foley catheter prior to discharge.   Acute metabolic encephalopathy: Patient with lethargy and decreased responsiveness in the setting of sepsis. Alert, Arousable , oriented to self and person. Delirium precaution   Fecal impaction CT of the pelvis showed  significant stool retention within the rectal wall consistent  with fecal impaction Patient had a good bowel movement during my assessment. Continue daily MiraLAX  and Senokot-S, escalate as needed   A-fib: EKG shows A-fib, HR stable Continue Eliquis  and Lopressor  Trend and replete electrolytes   Chronic diastolic HF: Hypertension: Last TTE in 11/25 shows EF 55-60%, moderately dilated LA, severely dilated RA and moderate TVR BP initially soft but stable with SBP in the 110-130s   Macrocytic anemia Hgb of 8.7, around baseline of 8-9. Continue iron  and folate supplementation Transfuse for Hgb >7  Chronic thrombocytopenia Platelet of 54, stable compared to recent baseline of 40 to 50s Trend CBC   QTc prolongation EKG on admission shows prolonged QTc of 562 Avoid QTc prolonging meds Telemetry   Hx of depression: Continue Prozac    GAD: Continue as needed Xanax    GERD Continue Protonix    DVT prophylaxis: Eliquis  Code Status:DNR Family Communication: Wife at bedside Disposition Plan:   Status is: Inpatient Remains inpatient appropriate because: Patient admitted for sepsis secondary to decubitus ulcer and UTI.   It is not medically ready for discharge yet  Consultants:  Wound care  Procedures:  Antimicrobials: Anti-infectives (From admission, onward)    Start     Dose/Rate Route Frequency Ordered Stop   04/15/24 1000  cefTRIAXone  (ROCEPHIN ) 1 g in sodium chloride  0.9 % 100 mL IVPB        1 g 200 mL/hr over 30 Minutes Intravenous Every 24 hours 04/15/24 0051     04/14/24 2330  linezolid  (ZYVOX ) IVPB 600 mg        600 mg 300 mL/hr over 60 Minutes Intravenous Every 12 hours 04/14/24 2311     04/14/24 1500  cefTRIAXone  (ROCEPHIN ) 1 g in sodium chloride  0.9 % 100 mL IVPB        1 g 200 mL/hr over 30 Minutes Intravenous  Once 04/14/24 1447 04/14/24 1542       Subjective: Patient was seen and examined at bedside.  Overnight events noted. Patient is severely deconditioned,  alert and arousable but very  confused.  Objective: Vitals:   04/15/24 0400 04/15/24 0557 04/15/24 0600 04/15/24 1008  BP: 111/66  115/62 (!) 97/56  Pulse: 89  78 65  Resp:   (!) 28 17  Temp:  97.9 F (36.6 C)  97.9 F (36.6 C)  TempSrc:  Axillary  Oral  SpO2: 94%  98% 100%    Intake/Output Summary (Last 24 hours) at 04/15/2024 1557 Last data filed at 04/15/2024 1422 Gross per 24 hour  Intake 728.93 ml  Output --  Net 728.93 ml   There were no vitals filed for this visit.  Examination:  General exam: Appears calm and comfortable, Severely deconditioned, not in any acute distress Respiratory system: Clear to auscultation. Respiratory effort normal.  RR 14 Cardiovascular system: S1 & S2 heard, RRR. No JVD, murmurs, rubs, gallops or clicks.  Gastrointestinal system: Abdomen is non distended, soft and non  tender.Normal bowel sounds heard. Central nervous system: Alert and oriented x 1. No focal neurological deficits. Extremities: No edema, No cyanosis, No clubbing  Skin: No rashes, lesions or ulcers Psychiatry: Not assessed.  Data Reviewed: I have personally reviewed following labs and imaging studies  CBC: Recent Labs  Lab 04/14/24 1242 04/15/24 0516  WBC 6.7 6.7  NEUTROABS 5.5  --   HGB 8.7* 8.8*  HCT 27.4* 27.4*  MCV 106.6* 106.6*  PLT 54* 44*   Basic Metabolic Panel: Recent Labs  Lab 04/14/24  1242 04/15/24 0516  NA 136 138  K 3.7 4.2  CL 104 105  CO2 21* 21*  GLUCOSE 115* 129*  BUN 32* 31*  CREATININE 1.15 1.19  CALCIUM  8.8* 8.8*  MG  --  1.7  PHOS  --  3.9   GFR: CrCl cannot be calculated (Unknown ideal weight.). Liver Function Tests: Recent Labs  Lab 04/14/24 1242  AST 22  ALT 6  ALKPHOS 186*  BILITOT 1.1  PROT 6.3*  ALBUMIN 2.6*   No results for input(s): LIPASE, AMYLASE in the last 168 hours. No results for input(s): AMMONIA in the last 168 hours. Coagulation Profile: Recent Labs  Lab 04/14/24 1242  INR 1.9*   Cardiac Enzymes: No results for input(s):  CKTOTAL, CKMB, CKMBINDEX, TROPONINI in the last 168 hours. BNP (last 3 results) No results for input(s): PROBNP in the last 8760 hours. HbA1C: No results for input(s): HGBA1C in the last 72 hours. CBG: No results for input(s): GLUCAP in the last 168 hours. Lipid Profile: No results for input(s): CHOL, HDL, LDLCALC, TRIG, CHOLHDL, LDLDIRECT in the last 72 hours. Thyroid  Function Tests: No results for input(s): TSH, T4TOTAL, FREET4, T3FREE, THYROIDAB in the last 72 hours. Anemia Panel: No results for input(s): VITAMINB12, FOLATE, FERRITIN, TIBC, IRON , RETICCTPCT in the last 72 hours. Sepsis Labs: Recent Labs  Lab 04/14/24 1346  LATICACIDVEN 1.5    Recent Results (from the past 240 hours)  Blood Culture (routine x 2)     Status: None (Preliminary result)   Collection Time: 04/14/24 12:42 PM   Specimen: BLOOD  Result Value Ref Range Status   Specimen Description BLOOD SITE NOT SPECIFIED  Final   Special Requests   Final    BOTTLES DRAWN AEROBIC AND ANAEROBIC Blood Culture results may not be optimal due to an inadequate volume of blood received in culture bottles   Culture  Setup Time   Final    GRAM POSITIVE COCCI ANAEROBIC BOTTLE ONLY Organism ID to follow CRITICAL RESULT CALLED TO, READ BACK BY AND VERIFIED WITHBETHA KYM BARTER PHARMD, AT 9244 04/16/23 D. VANHOOK Performed at [facility], [address], [place], [place] [address]    Culture GRAM POSITIVE COCCI  Final   Report Status PENDING  Incomplete  Urine Culture     Status: None   Collection Time: 04/14/24 12:42 PM   Specimen: Urine, Random  Result Value Ref Range Status   Specimen Description URINE, RANDOM  Final   Special Requests NONE Reflexed from [id number]  Final   Culture   Final    NO GROWTH Performed at [facility], [address], [place], [place] [address]    Report Status 04/15/2024 FINAL  Final  Blood Culture ID Panel (Reflexed)     Status: Abnormal    Collection Time: 04/14/24 12:42 PM  Result Value Ref Range Status   Enterococcus faecalis NOT DETECTED NOT DETECTED Final   Enterococcus Faecium NOT DETECTED NOT DETECTED Final   Listeria monocytogenes NOT DETECTED NOT DETECTED Final   Staphylococcus species DETECTED (A) NOT DETECTED Final    Comment: CRITICAL RESULT CALLED TO, READ BACK BY AND VERIFIED WITHBETHA KYM BARTER PHARMD, AT 0755 04/16/23 D. VANHOOK    Staphylococcus aureus (BCID) NOT DETECTED NOT DETECTED Final   Staphylococcus epidermidis NOT DETECTED NOT DETECTED Final   Staphylococcus lugdunensis NOT DETECTED NOT DETECTED Final   Streptococcus species NOT DETECTED NOT DETECTED Final   Streptococcus agalactiae NOT DETECTED NOT DETECTED Final   Streptococcus pneumoniae  NOT DETECTED NOT DETECTED Final   Streptococcus pyogenes NOT DETECTED NOT DETECTED Final   A.calcoaceticus-baumannii NOT DETECTED NOT DETECTED Final   Bacteroides fragilis NOT DETECTED NOT DETECTED Final   Enterobacterales NOT DETECTED NOT DETECTED Final   Enterobacter cloacae complex NOT DETECTED NOT DETECTED Final   Escherichia coli NOT DETECTED NOT DETECTED Final   Klebsiella aerogenes NOT DETECTED NOT DETECTED Final   Klebsiella oxytoca NOT DETECTED NOT DETECTED Final   Klebsiella pneumoniae NOT DETECTED NOT DETECTED Final   Proteus species NOT DETECTED NOT DETECTED Final   Salmonella species NOT DETECTED NOT DETECTED Final   Serratia marcescens NOT DETECTED NOT DETECTED Final   Haemophilus influenzae NOT DETECTED NOT DETECTED Final   Neisseria meningitidis NOT DETECTED NOT DETECTED Final   Pseudomonas aeruginosa NOT DETECTED NOT DETECTED Final   Stenotrophomonas maltophilia NOT DETECTED NOT DETECTED Final   Candida albicans NOT DETECTED NOT DETECTED Final   Candida auris NOT DETECTED NOT DETECTED Final   Candida glabrata NOT DETECTED NOT DETECTED Final   Candida krusei NOT DETECTED NOT DETECTED Final   Candida parapsilosis NOT DETECTED NOT DETECTED  Final   Candida tropicalis NOT DETECTED NOT DETECTED Final   Cryptococcus neoformans/gattii NOT DETECTED NOT DETECTED Final    Comment: Performed at [facility], [address] [place], [place] [address]  Resp panel by RT-PCR (RSV, Flu A&B, Covid) Anterior Nasal Swab     Status: None   Collection Time: 04/14/24 12:43 PM   Specimen: Anterior Nasal Swab  Result Value Ref Range Status   SARS Coronavirus 2 by RT PCR NEGATIVE NEGATIVE Final   Influenza A by PCR NEGATIVE NEGATIVE Final   Influenza B by PCR NEGATIVE NEGATIVE Final    Comment: (NOTE) The Xpert Xpress SARS-CoV-2/FLU/RSV plus assay is intended as an aid in the diagnosis of influenza from Nasopharyngeal swab specimens and should not be used as a sole basis for treatment. Nasal washings and aspirates are unacceptable for Xpert Xpress SARS-CoV-2/FLU/RSV testing.  Fact Sheet for Patients: bloggercourse.com  Fact Sheet for Healthcare Providers: seriousbroker.it  This test is not yet approved or cleared by the [place]  FDA and has been authorized for detection and/or diagnosis of SARS-CoV-2 by FDA under an Emergency Use Authorization (EUA). This EUA will remain in effect (meaning this test can be used) for the duration of the COVID-19 declaration under Section 564(b)(1) of the Act, 21 U.S.C. section 360bbb-3(b)(1), unless the authorization is terminated or revoked.     Resp Syncytial Virus by PCR NEGATIVE NEGATIVE Final    Comment: (NOTE) Fact Sheet for Patients: bloggercourse.com  Fact Sheet for Healthcare Providers: seriousbroker.it  This test is not yet approved or cleared by the [place]  FDA and has been authorized for detection and/or diagnosis of SARS-CoV-2 by FDA under an Emergency Use Authorization (EUA). This EUA will remain in effect (meaning this test can be used) for the duration of the COVID-19  declaration under Section 564(b)(1) of the Act, 21 U.S.C. section 360bbb-3(b)(1), unless the authorization is terminated or revoked.  Performed at [facility], [address] [place], [place] [address]   Blood Culture (routine x 2)     Status: None (Preliminary result)   Collection Time: 04/14/24 12:47 PM   Specimen: BLOOD  Result Value Ref Range Status   Specimen Description BLOOD SITE NOT SPECIFIED  Final   Special Requests   Final    BOTTLES DRAWN AEROBIC AND ANAEROBIC Blood Culture results may not be optimal due  to an inadequate volume of blood received in culture bottles   Culture  Setup Time   Final    GRAM POSITIVE COCCI AEROBIC BOTTLE ONLY Performed at [facility], [address] [place], [place] [address]    Culture GRAM POSITIVE COCCI  Final   Report Status PENDING  Incomplete    Radiology Studies: CT PELVIS W CONTRAST Result Date: 04/14/2024 CLINICAL DATA:  Soft tissue infection EXAM: CT PELVIS WITH CONTRAST TECHNIQUE: Multidetector CT imaging of the pelvis was performed using the standard protocol following the bolus administration of intravenous contrast. RADIATION DOSE REDUCTION: This exam was performed according to the departmental dose-optimization program which includes automated exposure control, adjustment of the mA and/or kV according to patient size and/or use of iterative reconstruction technique. CONTRAST:  75mL OMNIPAQUE  IOHEXOL  350 MG/ML SOLN COMPARISON:  03/12/2022 FINDINGS: Urinary Tract: Bladder is decompressed with a Foley catheter. Distal ureters are unremarkable. Bowel: No bowel obstruction or ileus. Retained stool within the rectal vault may reflect fecal impaction. No evidence of stercoral colitis. Vascular/Lymphatic: Atherosclerosis of the aorta and its branches. No pathologic adenopathy. Reproductive: Prostate is unremarkable. Incidental bilateral hydroceles. Other: No free fluid or free intraperitoneal gas. No abdominal wall hernia.  Musculoskeletal: Diffuse osteopenia. Sacral decubitus ulcer extending 5.5 cm in craniocaudal length, with gas lucency extending to the dorsal cortex of the sacrococcygeal junction. I do not see any bony destruction to suggest osteomyelitis. Associated fat stranding extends in the presacral space, without fluid collection or abscess. Reconstructed images demonstrate no additional findings. IMPRESSION: 1. Large sacral decubitus ulcer, with fat stranding in the subcutaneous tissues and extending into the presacral space consistent with cellulitis. No bony destruction to suggest osteomyelitis. 2. Significant stool retention within the rectal wall, consistent with fecal impaction. 3.  Aortic Atherosclerosis (ICD10-I70.0). Electronically Signed   By: Ozell Daring M.D.   On: 04/14/2024 17:42   DG Chest Port 1 View Result Date: 04/14/2024 CLINICAL DATA:  Questionable sepsis. EXAM: PORTABLE CHEST 1 VIEW COMPARISON:  Chest radiograph dated 02/21/2024. FINDINGS: There is diffuse chronic intra coarsening in keeping with interstitial lung disease. Scattered areas of faint hazy densities primarily over the right mid to lower lung field may be chronic but suspicious for edema or pneumonia. Small right pleural effusion. No pneumothorax. Stable cardiomegaly. Median sternotomy wires. No acute osseous pathology. IMPRESSION: 1. Chronic interstitial lung disease with superimposed edema or pneumonia. 2. Small right pleural effusion. Electronically Signed   By: Vanetta Chou M.D.   On: 04/14/2024 14:35   Scheduled Meds:  Chlorhexidine  Gluconate Cloth  6 each Topical Q0600   collagenase    Topical Daily   [START ON 04/12/2024] collagenase    Topical Daily   dextromethorphan -guaiFENesin   1 tablet Oral BID   ferrous sulfate   325 mg Oral Daily   FLUoxetine   20 mg Oral Daily   fluticasone  furoate-vilanterol  1 puff Inhalation Daily   folic acid   1 mg Oral Daily   liver oil-zinc  oxide   Topical BID   metoprolol  tartrate  12.5  mg Oral Daily   pantoprazole   40 mg Oral q morning   polyethylene glycol  17 g Oral Daily   senna-docusate  1 tablet Oral QHS   sodium hypochlorite   Irrigation Daily   Continuous Infusions:  cefTRIAXone  (ROCEPHIN )  IV 1 g (04/15/24 1158)   linezolid  (ZYVOX ) IV Stopped (04/15/24 0040)     LOS: 1 day    Time spent: 50 mins    [name], MD [facility]  If 7PM-7AM, please contact night-coverage  "

## 2024-04-15 NOTE — ED Notes (Signed)
 Went to reposition pt and family states that pt is more comfortable on his back. Educated family on the importance of pt being turned to prevent pressure on one area consistently. Family insisted that pt stay the way that he is. But will reposition extremities.

## 2024-04-15 NOTE — Consult Note (Signed)
 WOC Nurse Consult Note: this patient is known to WOC team from previous admission with a Stage 3 Pressure Injury at that time that was largely clean, returns with a necrotic unstageable pressure injury; CT negative for osteomyelitis  Reason for Consult: sacral and R lower leg wounds  Wound type: 1.  Unstageable Pressure Injury sacrum 70% dark necrotic tissue 30% red  2.  R lower leg full thickness ? Venous ulcer versus pressure versus trauma 100% Peace and tan tissue  Pressure Injury POA: Yes sacral wound  Measurement: see nursing flowsheet  Wound bed: as above  Drainage (amount, consistency, odor) foul smelling tan and dark Eye exudate, not purulent per MD notes  Periwound: extensive erythema and moisture damage noted surrounding sacral wound  Dressing procedure/placement/frequency:  Cleanse sacral wound with Vashe wound cleanser, do not rinse. Using a Q tip applicator insert Dakin's moistened Kerlix into wound bed daily, cover with dry gauze and secure with silicone foam or ABD pad and clothe tape whichever is preferred. After 3 days of Dakins will switch to Santyl  for continued chemical debridement.   Cleanse R lower leg wound with Vashe, do not rinse. Apply 1/4 thick layer of Santyl  to wound bed, top with saline moist gauze, dry gauze and secure with silicone foam or Kerlix roll gauze and clothe tape.    Patient should be placed on a low air loss mattress for pressure redistribution and moisture management.  Will add a thin layer of Desitin to skin surrounding sacral wound 2 times daily.   POC discussed with bedside nurse. WOC team will not follow. Reconsult if further needs arise.   Thank you,    Powell Bar MSN, RN-BC, [employer]

## 2024-04-15 NOTE — Progress Notes (Signed)
 PHARMACY - PHYSICIAN COMMUNICATION CRITICAL VALUE ALERT - BLOOD CULTURE IDENTIFICATION (BCID)  Evan [name] is an 89 y.o. male who presented to [facility] on 04/14/2024 with a chief complaint of fever and lethargy.   Assessment: 89 year old male on home hospice admitted with fever and lethargy. Has a sacral wound infection and concern for aspiration pneumonia. Blood cultures now with gram positive cocci in one bottle of each set-  unclear if drawn from different sites.   Name of physician (or Provider) Contacted: Leotis   Current antibiotics: linezolid /ceftriaxone   Changes to prescribed antibiotics recommended:  Linezolid  will provide some coverage Consider repeat blood cultures May need to consider alternative agent (Vanc vs Dapto) if platelet count significantly decreases  Results for orders placed or performed during the hospital encounter of 04/14/24  Blood Culture ID Panel (Reflexed) (Collected: 04/14/2024 12:42 PM)  Result Value Ref Range   Enterococcus faecalis NOT DETECTED NOT DETECTED   Enterococcus Faecium NOT DETECTED NOT DETECTED   Listeria monocytogenes NOT DETECTED NOT DETECTED   Staphylococcus species DETECTED (A) NOT DETECTED   Staphylococcus aureus (BCID) NOT DETECTED NOT DETECTED   Staphylococcus epidermidis NOT DETECTED NOT DETECTED   Staphylococcus lugdunensis NOT DETECTED NOT DETECTED   Streptococcus species NOT DETECTED NOT DETECTED   Streptococcus agalactiae NOT DETECTED NOT DETECTED   Streptococcus pneumoniae NOT DETECTED NOT DETECTED   Streptococcus pyogenes NOT DETECTED NOT DETECTED   A.calcoaceticus-baumannii NOT DETECTED NOT DETECTED   Bacteroides fragilis NOT DETECTED NOT DETECTED   Enterobacterales NOT DETECTED NOT DETECTED   Enterobacter cloacae complex NOT DETECTED NOT DETECTED   Escherichia coli NOT DETECTED NOT DETECTED   Klebsiella aerogenes NOT DETECTED NOT DETECTED   Klebsiella oxytoca NOT DETECTED NOT DETECTED   Klebsiella pneumoniae NOT  DETECTED NOT DETECTED   Proteus species NOT DETECTED NOT DETECTED   Salmonella species NOT DETECTED NOT DETECTED   Serratia marcescens NOT DETECTED NOT DETECTED   Haemophilus influenzae NOT DETECTED NOT DETECTED   Neisseria meningitidis NOT DETECTED NOT DETECTED   Pseudomonas aeruginosa NOT DETECTED NOT DETECTED   Stenotrophomonas maltophilia NOT DETECTED NOT DETECTED   Candida albicans NOT DETECTED NOT DETECTED   Candida auris NOT DETECTED NOT DETECTED   Candida glabrata NOT DETECTED NOT DETECTED   Candida krusei NOT DETECTED NOT DETECTED   Candida parapsilosis NOT DETECTED NOT DETECTED   Candida tropicalis NOT DETECTED NOT DETECTED   Cryptococcus neoformans/gattii NOT DETECTED NOT DETECTED    Damien Quiet, PharmD, BCPS, BCIDP Infectious Diseases Clinical Pharmacist Phone: [phone] 04/15/2024  8:56 AM

## 2024-04-16 DIAGNOSIS — Z711 Person with feared health complaint in whom no diagnosis is made: Secondary | ICD-10-CM | POA: Diagnosis not present

## 2024-04-16 DIAGNOSIS — Z515 Encounter for palliative care: Secondary | ICD-10-CM

## 2024-04-16 DIAGNOSIS — A419 Sepsis, unspecified organism: Secondary | ICD-10-CM | POA: Diagnosis not present

## 2024-04-16 DIAGNOSIS — Z7189 Other specified counseling: Secondary | ICD-10-CM

## 2024-04-16 LAB — CBC
HCT: 27.2 % — ABNORMAL LOW (ref 39.0–52.0)
Hemoglobin: 8.9 g/dL — ABNORMAL LOW (ref 13.0–17.0)
MCH: 34.2 pg — ABNORMAL HIGH (ref 26.0–34.0)
MCHC: 32.7 g/dL (ref 30.0–36.0)
MCV: 104.6 fL — ABNORMAL HIGH (ref 80.0–100.0)
Platelets: 41 K/uL — ABNORMAL LOW (ref 150–400)
RBC: 2.6 MIL/uL — ABNORMAL LOW (ref 4.22–5.81)
RDW: 18.6 % — ABNORMAL HIGH (ref 11.5–15.5)
WBC: 5.4 K/uL (ref 4.0–10.5)
nRBC: 0 % (ref 0.0–0.2)

## 2024-04-16 LAB — C DIFFICILE QUICK SCREEN W PCR REFLEX
C Diff antigen: NEGATIVE
C Diff interpretation: NOT DETECTED
C Diff toxin: NEGATIVE

## 2024-04-16 LAB — MRSA NEXT GEN BY PCR, NASAL: MRSA by PCR Next Gen: DETECTED — AB

## 2024-04-16 MED ORDER — SODIUM CHLORIDE 0.9 % IV SOLN: INTRAVENOUS | Status: AC

## 2024-04-16 MED ORDER — MUPIROCIN 2 % EX OINT
1.0000 | TOPICAL_OINTMENT | Freq: Two times a day (BID) | CUTANEOUS | Status: DC
  Administered 2024-04-16 – 2024-04-17 (×3): 1 via NASAL
  Filled 2024-04-16: qty 22

## 2024-04-16 MED ORDER — APIXABAN 5 MG PO TABS
5.0000 mg | ORAL_TABLET | Freq: Two times a day (BID) | ORAL | Status: DC
  Administered 2024-04-16: 5 mg via ORAL
  Filled 2024-04-16 (×3): qty 1

## 2024-04-16 NOTE — Consult Note (Signed)
 " Palliative Medicine Inpatient Consult Note  Consulting Provider: Dr. Leotis  Reason for consult:   Palliative Care Consult Services Palliative Medicine Consult  Reason for Consult? GOALS OF CARE   04/16/2024  HPI:  Per intake H&P -->  Evan [name] is a 89 year old male with a past medical history significant for coronary artery disease, hypertension, hyperlipidemia, CABG, A-fib on Eliquis , left thalamus stroke, CKD stage IIIb, congestive heart failure, recurrent aspirations, chronic hypoxic respiratory failure on 2 L nasal cannula at baseline, & previous history of lung cancer with radiation.  Evan [name] had been an established [facility] patient since November and was admitted to the hospital in the setting of sepsis from aspiration pneumonia as well as sacral wound infection.  Palliative care has been asked to get involved to clarify goals of care in the setting of patient being established with hospice.  Clinical Assessment/Goals of Care:  I have reviewed progress notes of RN - Woodroe Mace, Dr. Lou, Dr. Leotis, Hospice RN - Amy, & [facility] RN - Powell Blind, labs BMP, CBC, and imaging - CXR from 1/11 & CT pelvis from 1/11, received report from bedside RN, assessed the patient.    I met with patient's 3 children Evan [name] Evan [name], and daughter-in-law Evan [name] to further discuss diagnosis prognosis, GOC, EOL wishes, disposition and options.   I introduced Palliative Medicine as specialized medical care for people living with serious illness. It focuses on providing relief from the symptoms and stress of a serious illness. The goal is to improve quality of life for both the patient and the family.  Medical History Review and Understanding:  A review of Evan [name]'s past medical history significant for  coronary artery disease, hypertension, hyperlipidemia, CABG, A-fib on Eliquis , left thalamus stroke, CKD stage IIIb, congestive heart failure, recurrent aspirations, chronic hypoxic respiratory  failure on 2 L nasal cannula at baseline, & previous history of lung cancer with radiation was completed.  Social History:  Evan [name] lives in [place] .  His wife passed away in 05/21/2014.  He had 5 children though one of his daughters died in the past few years.  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  He is a man of faith and practices within Christianity.   Functional and Nutritional State:  Has been total care for the past two months. Now with minimal appetite.   Advance Directives:  A detailed discussion was had today regarding advanced directives.  Patients surrogate decision makers are his children three out of four of them are involved.  Code Status:  Concepts specific to code status, artifical feeding and hydration, continued IV antibiotics and rehospitalization was had.  The difference between a aggressive medical intervention path  and a palliative comfort care path for this patient at this time was had.   Evan [name] is an established DNAR/DNI.  Discussion:  The circumstances pertaining to hospitalization were discussed with patient's family.  Patient's children endorsed that he was getting more febrile at home to the point where he was having what appeared to be body jerking and concern for seizures on the family's behalf.  They had the hospice nurse come out who offered either sending the patient to the hospital for treatment or letting him die naturally in the home.  Patient's family opted for him to go to the hospital as they were greatly distressed that he had stopped eating and drinking.  Per discussion with patient's family they would like treatment  of his aspiration pneumonia and wound cellulitis.  We discussed that treatment will only be short-term and unfortunately he has a high likelihood of aspirating again.  We reviewed that his aspiration will be chronic and this is something which family does share awareness of  from having met with the palliative team back in November.  We discussed the option of feeding him for comfort as opposed to feeding him for vitality.    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  We reviewed utilizing antibiotics is only a short-term fix for a long-term incurable problem.  I shared the scope of hospice care and their philosophy versus the care that is being pursued now which the use of maintenance IV fluids and IV antibiotics is considered to be an aggressive realm of care.  I asked patient very openly and honestly if this is something which they would like to continue which at this point in time they do.  We reviewed when his infection recurs do they want him to come back to the hospital which they share they do not think they would want to happen as he is much more weak and deconditioned this hospitalization.  We reviewed the natural progression onto the dying process and what is typically seen inclusive of patients having worsening dysphagia and a lack of appetite.  We reviewed that this is very normal and that Evan [name] is not starving to death rather his body does not transmit the signals that he is even feeling hungry.  We discussed the options moving forward of pursuing strictly comfort when he gets home through the use of [facility] hospice.  We discussed the use of morphine  and how this medication can aid in support patients during their end-of-life journey to relieve distress.  Discussed the importance of continued conversation with family and their  medical providers regarding overall plan of care and treatment options, ensuring decisions are within the context of the patients values and GOCs.  Decision Maker: Evan [name]: Daughter, Emergency Contact: [phone] (Mobile)   SUMMARY OF RECOMMENDATIONS   DNAR/DNI  Continue current care with maintenance IV fluids  and IV antibiotics until course completion to be determined by hospitalist  Patient's family would like to take Evan [name] back home with [facility] hospice once medically optimized  Ongoing palliative care support  Code Status/Advance Care Planning: DNAR/DNI  Palliative Prophylaxis:  Aspiration, Bowel Regimen, Delirium Protocol, Frequent Pain Assessment, Oral Care, Palliative Wound Care, and Turn Reposition  Additional Recommendations (Limitations, Scope, Preferences): Continue current care  Psycho-social/Spiritual:  Desire for further Chaplaincy support: Yes Additional Recommendations: Education on end-of-life    Prognosis: Limited overall  Discharge Planning: Discharge home with [facility] hospice once medically optimized  Vitals:   04/15/24 1900 04/16/24 0428  BP: (!) 95/57 98/62  Pulse: 62 83  Resp: 16 16  Temp: (!) 97.3 F (36.3 C) 98 F (36.7 C)  SpO2: 100% 96%    Intake/Output Summary (Last 24 hours) at 04/16/2024 0835 Last data filed at 04/15/2024 2040 Gross per 24 hour  Intake 0 ml  Output 1250 ml  Net -1250 ml     LABS: CBC:    Component Value Date/Time   WBC 5.4 04/16/2024 0609   HGB 8.9 (L) 04/16/2024 0609   HGB 10.2 (L) 10/14/2022 0930   HGB 13.4 08/16/2016 1155   HCT 27.2 (L) 04/16/2024 0609   HCT 31.0 (L) 10/14/2022 0930  HCT 39.8 08/16/2016 1155   PLT 41 (L) 04/16/2024 0609   PLT 75 (LL) 10/14/2022 0930   MCV 104.6 (H) 04/16/2024 0609   MCV 98 (H) 10/14/2022 0930   MCV 92.3 08/16/2016 1155   NEUTROABS 5.5 04/14/2024 1242   NEUTROABS 3.5 08/16/2016 1155   LYMPHSABS 0.8 04/14/2024 1242   LYMPHSABS 1.3 08/16/2016 1155   MONOABS 0.4 04/14/2024 1242   MONOABS 1.2 (H) 08/16/2016 1155   EOSABS 0.0 04/14/2024 1242   EOSABS 0.1 08/16/2016 1155   BASOSABS 0.0 04/14/2024 1242   BASOSABS 0.0 08/16/2016 1155   Comprehensive Metabolic Panel:    Component Value Date/Time   NA 138 04/15/2024 0516   NA 136 10/14/2022 0930   NA 143 08/16/2016 1155    K 4.2 04/15/2024 0516   K 4.3 08/16/2016 1155   CL 105 04/15/2024 0516   CO2 21 (L) 04/15/2024 0516   CO2 24 08/16/2016 1155   BUN 31 (H) 04/15/2024 0516   BUN 24 10/14/2022 0930   BUN 14.6 08/16/2016 1155   CREATININE 1.19 04/15/2024 0516   CREATININE 1.04 10/10/2023 1531   CREATININE 1.2 08/16/2016 1155   GLUCOSE 129 (H) 04/15/2024 0516   GLUCOSE 88 08/16/2016 1155   GLUCOSE 120 (H) 04/20/2015 1600   CALCIUM  8.8 (L) 04/15/2024 0516   CALCIUM  9.0 08/16/2016 1155   AST 22 04/14/2024 1242   AST 18 08/16/2016 1155   ALT 6 04/14/2024 1242   ALT 10 08/16/2016 1155   ALKPHOS 186 (H) 04/14/2024 1242   ALKPHOS 101 08/16/2016 1155   BILITOT 1.1 04/14/2024 1242   BILITOT 0.7 06/22/2020 0953   BILITOT 1.26 (H) 08/16/2016 1155   PROT 6.3 (L) 04/14/2024 1242   PROT 6.4 06/22/2020 0953   PROT 6.7 08/16/2016 1155   ALBUMIN 2.6 (L) 04/14/2024 1242   ALBUMIN 3.9 06/22/2020 0953   ALBUMIN 3.6 08/16/2016 1155    Gen: Chronically ill-appearing elderly Caucasian male HEENT: Dry mucous membranes CV: Regular rate and rhythm PULM: On 4 L nasal cannula breathing is even and nonlabored ABD: soft/nontender EXT: Muscle wasting noted in all 4 extremities Neuro: Somnolent  PPS: 10-20%   This conversation/these recommendations were discussed with patient primary care team, Dr. Leotis ______________________________________________________ Rosaline [facility] [facility] Palliative Medicine Team Team Cell Phone: [phone] Please utilize secure chat with additional questions, if there is no response within 30 minutes please call the above phone number  I personally spent a total of 90 minutes in the care of the patient today including preparing to see the patient, getting/reviewing separately obtained history, performing a medically appropriate exam/evaluation, counseling and educating, placing orders, referring and communicating with other health care professionals, documenting clinical  information in the EHR, independently interpreting results, and coordinating care.  "

## 2024-04-16 NOTE — Progress Notes (Signed)
 SLP Cancellation Note  Patient Details Name: Evan [name] MRN: [id number] DOB: [birth year]   Cancelled treatment:       Reason Eval/Treat Not Completed: Pt known to SLP service from previous admissions with most recent MBS (02/2024) showing aspiration of all liquids. At that time, pt's family agreed to continue regular/thin diet with known risk. This was revisited this admission with PMT and family again agreed to continue regular diet, acknowledging risk. Touched base with pt and his family in addition to medical team. All questions answered and education complete. Acute SLP interventions are not warranted at this time, will sign off.    Damien Blumenthal, M.A., CCC-SLP Speech Language Pathology, Acute Rehabilitation Services  Secure Chat preferred [phone]  04/16/2024, 3:03 PM

## 2024-04-16 NOTE — Progress Notes (Signed)
" °   04/16/24 1220  TOC Brief Assessment  Insurance and Status Reviewed  Patient has primary care physician Yes  Home environment has been reviewed home with family  Prior level of function: dependent  Prior/Current Home Services Current home services (active with [facility] for home hospice)  Social Drivers of Health Review SDOH reviewed no interventions necessary     Patient from home with family, has oxygen  and hospital bed. Last admission discharged home via ambulance. Patient active with [facility] for home hospice. Amy with [facility] working on disposition.    Inpatient Care Management Team will continue to follow.  "

## 2024-04-16 NOTE — Progress Notes (Signed)
 Patient received with foley FR. 14 connected to standard urine back with adhesive securement device. Foley care done. Reason for foley is due to chronic use. Cannot bring out foley tab on epic.

## 2024-04-16 NOTE — Progress Notes (Signed)
 Informed Lucienne, Photographer of [employer] about the EPIC issue for the tab for the foley catheter not showing in the system.

## 2024-04-16 NOTE — Progress Notes (Signed)
 Evan [name] [facility]  [facility] Liaison Note   Evan [name] is a current hospice patient with a terminal diagnosis of cerebrovascular disease. He was brought to the ED after being lethargic and having a fever for over 24 hours. He was admitted for sepsis, wound cellulitis and aspiration pneumonia. Per Dr. Jeryl with [facility], this is a related hospital admission.    [name] Liaison met with pts wife at bedside. She said that she was told that the patient was no longer under hospice services. Explained to her that the patient is still in fact on hospice services and the hospital admission is covered because the reason that he is here is related to his hospice diagnosis.  She was very concerned about getting a hospital bill.   Palliative met with family earlier to establish goal of care conversation. Family is wanting pt to continue with IV antibiotics even though they likely are not going to get rid of the infection.    Patient remains inpatient appropriate due to need for IV antibiotics.   Vital Signs: 97.9, 81, 16 ,99/56   SPO2 100% on 4L [place]   I&O:  not documented   Abnormal Labs:  04/16/24  HGB 8.8 HCT: 27.4(L) Platelets 41 (L) RBC 2.6 (L) RDW 18.6 (H)   Diagnostics:   None new    IV/PRN Meds:  Cefepime  1g IV Q24H Zyvox  600 mg Q 12 H Robitussin DM 5mg  Q4H x1   Problem list as per MD note, Evan Dawley, MD  04/16/2024: Assessment & Plan: Principal Problem:   Sepsis (HCC) Active Problems:   Atrial fibrillation, chronic (HCC)   Acute metabolic encephalopathy   Fecal impaction (HCC)   Anxiety and depression   Chronic diastolic HF (heart failure) (HCC)   Acute on chronic hypoxic respiratory failure (HCC)   Sepsis: Multi-Factorial: Patient presented with fevers, tachycardia, leukocytosis, decreased mentation and lethargy. Findings consistent with worsening wound infection / cellulitis and evidence of possible aspiration pneumonia Initiated on IV  linezolid  and Rocephin  Continue IV hydration with NS 2 40cc/hr Cultures grew Staphylococcus simulans, sensitivity pending. Trend CBC, fever curve.   Cellulitis Sacral wound infection Patient with history of sacral decubitus ulcer, not currently managed in the outpatient due to patient being on hospice presented with worsening wound and lethargic CT pelvics shows a large sacral decubitus ulcer with fat stranding in the subcutaneous tissues and extending into the presacral space consistent with cellulitis but no osteomyelitis. Empiric antibiotics as above. Wound care consult   Aspiration Pneumonia: Patient with history of recurrent aspirations presenting with recent cough, congestion and found to have increased O2 requirement CXR shows signs of possible pneumonia, likely aspiration due to history of being bed bound. Continue IV antibiotics as above. Mucinex  DM and as needed Robitussin for cough. Aspiration precaution.   Acute on chronic hypoxic respiratory failure: COPD: Patient with increased O2 requirement from baseline 2 L [place] to 4 L [place] This is likely in the setting of pneumonia. Continue home bronchodilator, start as needed DuoNeb Continue supplemental O2, wean as able   Chronic urinary retention: Status post chronic Foley, per family, foley changed every month by hospice, last changed on 03/17/24 Urinalysis shows some signs of infection, improved from 2 months ago, likely colonized Consider exchange of Foley catheter prior to discharge.   Acute metabolic encephalopathy: Patient with lethargy and decreased responsiveness in the setting of sepsis. Alert, Arousable , oriented to self and person. Delirium precaution   Fecal impaction CT of the pelvis  showed significant stool retention within the rectal wall consistent with fecal impaction Patient had a good bowel movement during my assessment. Continue daily MiraLAX  and Senokot-S, escalate as needed   A-fib: EKG shows A-fib,  HR stable Continue Eliquis  and Lopressor  Trend and replete electrolytes   Chronic diastolic HF: Hypertension: Last TTE in 11/25 shows EF 55-60%, moderately dilated LA, severely dilated RA and moderate TVR BP initially soft but stable with SBP in the 110-130s   Macrocytic anemia Hgb of 8.7, around baseline of 8-9. Continue iron  and folate supplementation Transfuse for Hgb >7   Chronic thrombocytopenia Platelet of 54, stable compared to recent baseline of 40 to 50s Trend CBC    Disposition Plan: This will depend on hospital course    Discharge Planning: Ongoing   Family Contact: spoke with wife at bedside   IDT: updated   Goals of Care: DNR     Should patient need ambulance transfer at discharge - please use [facility]) as they contract this service for our active hospice patients.   Evan [name], BSN, RN [facility] Liaison [phone]

## 2024-04-16 NOTE — Progress Notes (Signed)
 Patient have scheduled medication and the pill is quite big. According to the relative of the patient, the patient cannot tolerate large pills. Requested the medication to be on liquid form. When the medication in liquid form was given, the patient is struggling to swallow. Informed on call Isaiah Lever NP.

## 2024-04-16 NOTE — Plan of Care (Signed)
   Problem: Safety: Goal: Ability to remain free from injury will improve Outcome: Progressing   Problem: Skin Integrity: Goal: Risk for impaired skin integrity will decrease Outcome: Progressing

## 2024-04-16 NOTE — Progress Notes (Signed)
 Overnight RN had concerns related to patients swallowing ability when liquid form of medication was attempted.  Night shift RN spoke to on call provider but was advised to discuss this with day shift rounding MD.   Patient's son requesting to order patient breakfast.  RN reached out to MD Leotis to inform of concerns regarding patient's ability to swallow and current diet orders.   Speech Swallow evaluation and nothing by mouth ordered.

## 2024-04-16 NOTE — Progress Notes (Signed)
" PROGRESS NOTE    Evan [name]  FMW:[id number] DOB: 02-01-1928 DOA: 04/14/2024 PCP: [name], MD   Brief Narrative:  This 89 y.o. male with medical history significant for HTN, HLD, CAD s/p CABG, A-fib on Eliquis , left thalamic stroke currently bedbound, pressure ulcer, CKD 3B, diastolic CHF, chronic hypoxic respiratory failure on 2 L [place], remote history of lung cancer, recurrent aspiration, anxiety and depression presented to the ED for evaluation of fever and lethargy. Per family, patient currently receiving home hospice and yesterday, he had a low-grade fever with shaking but was at baseline mental status. This morning, he had a fever of 100.4 and seems to be more lethargic, refusing to eat or drink.  Family reports he has also had some congestion and cough recently  They did increase his oxygen  from 2 L to 4 L [place]. States patient just completed 3 days of ciprofloxacin  and has had some loose stools with it.  at the bedside, patient complains of feeling cold but no other complaints.  He was febrile, tachycardic, tachypneic , hypotensive and hypoxic in the ED.  UA consistent with UTI.  Chest x-ray shows chronic interstitial lung disease with superimposed edema or pneumonia. CT pelvics shows large sacral decubitus ulcer with fat stranding in the subcutaneous tissues and extending to the presacral space consistent with cellulitis but no bony destruction to suggest osteomyelitis, significant stool retention within the rectal wall consistent with fecal impaction.  Patient was admitted for further evaluation and started on empiric antibiotics.  Assessment & Plan:   Principal Problem:   Sepsis (HCC) Active Problems:   Atrial fibrillation, chronic (HCC)   Acute metabolic encephalopathy   Fecal impaction (HCC)   Anxiety and depression   Chronic diastolic HF (heart failure) (HCC)   Acute on chronic hypoxic respiratory failure (HCC)  Sepsis: Multi-Factorial: Patient presented with fevers,  tachycardia, leukocytosis, decreased mentation and lethargy. Findings consistent with worsening wound infection / cellulitis and evidence of possible aspiration pneumonia Initiated on IV linezolid  and Rocephin  Continue IV hydration with NS 2 40cc/hr Cultures grew Staphylococcus simulans, sensitivity pending. Trend CBC, fever curve.   Cellulitis Sacral wound infection Patient with history of sacral decubitus ulcer, not currently managed in the outpatient due to patient being on hospice presented with worsening wound and lethargic CT pelvics shows a large sacral decubitus ulcer with fat stranding in the subcutaneous tissues and extending into the presacral space consistent with cellulitis but no osteomyelitis. Empiric antibiotics as above. Wound care consult   Aspiration Pneumonia: Patient with history of recurrent aspirations presenting with recent cough, congestion and found to have increased O2 requirement CXR shows signs of possible pneumonia, likely aspiration due to history of being bed bound. Continue IV antibiotics as above. Mucinex  DM and as needed Robitussin for cough. Aspiration precaution.   Acute on chronic hypoxic respiratory failure: COPD: Patient with increased O2 requirement from baseline 2 L [place] to 4 L [place] This is likely in the setting of pneumonia. Continue home bronchodilator, start as needed DuoNeb Continue supplemental O2, wean as able   Chronic urinary retention: Status post chronic Foley, per family, foley changed every month by hospice, last changed on 03/17/24 Urinalysis shows some signs of infection, improved from 2 months ago, likely colonized Consider exchange of Foley catheter prior to discharge.   Acute metabolic encephalopathy: Patient with lethargy and decreased responsiveness in the setting of sepsis. Alert, Arousable , oriented to self and person. Delirium precaution   Fecal impaction CT of the pelvis  showed significant stool retention within the  rectal wall consistent with fecal impaction Patient had a good bowel movement during my assessment. Continue daily MiraLAX  and Senokot-S, escalate as needed   A-fib: EKG shows A-fib, HR stable Continue Eliquis  and Lopressor  Trend and replete electrolytes   Chronic diastolic HF: Hypertension: Last TTE in 11/25 shows EF 55-60%, moderately dilated LA, severely dilated RA and moderate TVR BP initially soft but stable with SBP in the 110-130s   Macrocytic anemia Hgb of 8.7, around baseline of 8-9. Continue iron  and folate supplementation Transfuse for Hgb >7  Chronic thrombocytopenia Platelet of 54, stable compared to recent baseline of 40 to 50s Trend CBC   QTc prolongation EKG on admission shows prolonged QTc of 562 Avoid QTc prolonging meds Telemetry   Hx of depression: Continue Prozac    GAD: Continue as needed Xanax    GERD Continue Protonix .  Goals of care discussion: Patient does have significant comorbidities and severe deconditioning and also has high risk for aspiration.  Patient is n.p.o. family does not want PEG tube.  Palliative care is consulted to discuss with family about goals of care. I would think hospice would be appropriate for this patient.    DVT prophylaxis: Eliquis  Code Status:DNR Family Communication: Daughter at bedside Disposition Plan:   Status is: Inpatient Remains inpatient appropriate because: Patient admitted for sepsis secondary to decubitus ulcer and UTI.   He is not medically ready for discharge yet  Consultants:  Wound care  Procedures:  Antimicrobials: Anti-infectives (From admission, onward)    Start     Dose/Rate Route Frequency Ordered Stop   04/15/24 1000  cefTRIAXone  (ROCEPHIN ) 1 g in sodium chloride  0.9 % 100 mL IVPB        1 g 200 mL/hr over 30 Minutes Intravenous Every 24 hours 04/15/24 0051     04/14/24 2330  linezolid  (ZYVOX ) IVPB 600 mg        600 mg 300 mL/hr over 60 Minutes Intravenous Every 12 hours 04/14/24  2311     04/14/24 1500  cefTRIAXone  (ROCEPHIN ) 1 g in sodium chloride  0.9 % 100 mL IVPB        1 g 200 mL/hr over 30 Minutes Intravenous  Once 04/14/24 1447 04/14/24 1542       Subjective: Patient was seen and examined at bedside.  Overnight events noted. Patient is severely deconditioned,  alert and arousable but very confused.  Objective: Vitals:   04/15/24 1816 04/15/24 1900 04/16/24 0428 04/16/24 1018  BP: (!) 95/55 (!) 95/57 98/62 (!) 99/56  Pulse: 62 62 83 81  Resp: 16 16 16 16   Temp: (!) 97.3 F (36.3 C) (!) 97.3 F (36.3 C) 98 F (36.7 C) 97.9 F (36.6 C)  TempSrc: Oral Oral Oral Oral  SpO2: 100% 100% 96% 100%    Intake/Output Summary (Last 24 hours) at 04/16/2024 1153 Last data filed at 04/16/2024 0900 Gross per 24 hour  Intake 0 ml  Output 1250 ml  Net -1250 ml   There were no vitals filed for this visit.  Examination:  General exam: Appears calm and comfortable, Severely deconditioned, not in any acute distress Respiratory system: Decreased breath sounds. Respiratory effort normal.  RR 13 Cardiovascular system: S1 & S2 heard, RRR. No JVD, murmurs, rubs, gallops or clicks.  Gastrointestinal system: Abdomen is non distended, soft and non  tender.Normal bowel sounds heard. Central nervous system: Alert and oriented x 1. No focal neurological deficits. Extremities: No edema, No cyanosis, No clubbing  Skin: No  rashes, lesions or ulcers Psychiatry: Not assessed.  Data Reviewed: I have personally reviewed following labs and imaging studies  CBC: Recent Labs  Lab 04/14/24 1242 04/15/24 0516 04/16/24 0609  WBC 6.7 6.7 5.4  NEUTROABS 5.5  --   --   HGB 8.7* 8.8* 8.9*  HCT 27.4* 27.4* 27.2*  MCV 106.6* 106.6* 104.6*  PLT 54* 44* 41*   Basic Metabolic Panel: Recent Labs  Lab 04/14/24 1242 04/15/24 0516  NA 136 138  K 3.7 4.2  CL 104 105  CO2 21* 21*  GLUCOSE 115* 129*  BUN 32* 31*  CREATININE 1.15 1.19  CALCIUM  8.8* 8.8*  MG  --  1.7  PHOS  --   3.9   GFR: CrCl cannot be calculated (Unknown ideal weight.). Liver Function Tests: Recent Labs  Lab 04/14/24 1242  AST 22  ALT 6  ALKPHOS 186*  BILITOT 1.1  PROT 6.3*  ALBUMIN 2.6*   No results for input(s): LIPASE, AMYLASE in the last 168 hours. No results for input(s): AMMONIA in the last 168 hours. Coagulation Profile: Recent Labs  Lab 04/14/24 1242  INR 1.9*   Cardiac Enzymes: No results for input(s): CKTOTAL, CKMB, CKMBINDEX, TROPONINI in the last 168 hours. BNP (last 3 results) No results for input(s): PROBNP in the last 8760 hours. HbA1C: No results for input(s): HGBA1C in the last 72 hours. CBG: No results for input(s): GLUCAP in the last 168 hours. Lipid Profile: No results for input(s): CHOL, HDL, LDLCALC, TRIG, CHOLHDL, LDLDIRECT in the last 72 hours. Thyroid  Function Tests: No results for input(s): TSH, T4TOTAL, FREET4, T3FREE, THYROIDAB in the last 72 hours. Anemia Panel: No results for input(s): VITAMINB12, FOLATE, FERRITIN, TIBC, IRON , RETICCTPCT in the last 72 hours. Sepsis Labs: Recent Labs  Lab 04/14/24 1346  LATICACIDVEN 1.5    Recent Results (from the past 240 hours)  Blood Culture (routine x 2)     Status: Abnormal (Preliminary result)   Collection Time: 04/14/24 12:42 PM   Specimen: BLOOD  Result Value Ref Range Status   Specimen Description BLOOD SITE NOT SPECIFIED  Final   Special Requests   Final    BOTTLES DRAWN AEROBIC AND ANAEROBIC Blood Culture results may not be optimal due to an inadequate volume of blood received in culture bottles   Culture  Setup Time   Final    GRAM POSITIVE COCCI IN BOTH AEROBIC AND ANAEROBIC BOTTLES Organism ID to follow CRITICAL RESULT CALLED TO, READ BACK BY AND VERIFIED WITHBETHA KYM BARTER PHARMD, AT 9244 04/16/23 D. VANHOOK GRAM POSITIVE RODS AEROBIC BOTTLE ONLY Gram Stain Report Called to,Read Back By and Verified With: PHARMD J LEDFORD 04/15/2024 @  2159 BY AB    Culture (A)  Final    STAPHYLOCOCCUS SIMULANS SUSCEPTIBILITIES TO FOLLOW Performed at [facility], [address] [place], [place] [address]    Report Status PENDING  Incomplete  Urine Culture     Status: None   Collection Time: 04/14/24 12:42 PM   Specimen: Urine, Random  Result Value Ref Range Status   Specimen Description URINE, RANDOM  Final   Special Requests NONE Reflexed from [id number]  Final   Culture   Final    NO GROWTH Performed at [facility], [address] [place], [place] [address]    Report Status 04/15/2024 FINAL  Final  Blood Culture ID Panel (Reflexed)     Status: Abnormal   Collection Time: 04/14/24 12:42 PM  Result Value Ref Range Status  Enterococcus faecalis NOT DETECTED NOT DETECTED Final   Enterococcus Faecium NOT DETECTED NOT DETECTED Final   Listeria monocytogenes NOT DETECTED NOT DETECTED Final   Staphylococcus species DETECTED (A) NOT DETECTED Final    Comment: CRITICAL RESULT CALLED TO, READ BACK BY AND VERIFIED WITHBETHA KYM BARTER PHARMD, AT 9244 04/16/23 D. VANHOOK    Staphylococcus aureus (BCID) NOT DETECTED NOT DETECTED Final   Staphylococcus epidermidis NOT DETECTED NOT DETECTED Final   Staphylococcus lugdunensis NOT DETECTED NOT DETECTED Final   Streptococcus species NOT DETECTED NOT DETECTED Final   Streptococcus agalactiae NOT DETECTED NOT DETECTED Final   Streptococcus pneumoniae NOT DETECTED NOT DETECTED Final   Streptococcus pyogenes NOT DETECTED NOT DETECTED Final   A.calcoaceticus-baumannii NOT DETECTED NOT DETECTED Final   Bacteroides fragilis NOT DETECTED NOT DETECTED Final   Enterobacterales NOT DETECTED NOT DETECTED Final   Enterobacter cloacae complex NOT DETECTED NOT DETECTED Final   Escherichia coli NOT DETECTED NOT DETECTED Final   Klebsiella aerogenes NOT DETECTED NOT DETECTED Final   Klebsiella oxytoca NOT DETECTED NOT DETECTED Final   Klebsiella pneumoniae NOT DETECTED NOT DETECTED Final   Proteus  species NOT DETECTED NOT DETECTED Final   Salmonella species NOT DETECTED NOT DETECTED Final   Serratia marcescens NOT DETECTED NOT DETECTED Final   Haemophilus influenzae NOT DETECTED NOT DETECTED Final   Neisseria meningitidis NOT DETECTED NOT DETECTED Final   Pseudomonas aeruginosa NOT DETECTED NOT DETECTED Final   Stenotrophomonas maltophilia NOT DETECTED NOT DETECTED Final   Candida albicans NOT DETECTED NOT DETECTED Final   Candida auris NOT DETECTED NOT DETECTED Final   Candida glabrata NOT DETECTED NOT DETECTED Final   Candida krusei NOT DETECTED NOT DETECTED Final   Candida parapsilosis NOT DETECTED NOT DETECTED Final   Candida tropicalis NOT DETECTED NOT DETECTED Final   Cryptococcus neoformans/gattii NOT DETECTED NOT DETECTED Final    Comment: Performed at [facility], [address] [place], [place] [address]  Resp panel by RT-PCR (RSV, Flu A&B, Covid) Anterior Nasal Swab     Status: None   Collection Time: 04/14/24 12:43 PM   Specimen: Anterior Nasal Swab  Result Value Ref Range Status   SARS Coronavirus 2 by RT PCR NEGATIVE NEGATIVE Final   Influenza A by PCR NEGATIVE NEGATIVE Final   Influenza B by PCR NEGATIVE NEGATIVE Final    Comment: (NOTE) The Xpert Xpress SARS-CoV-2/FLU/RSV plus assay is intended as an aid in the diagnosis of influenza from Nasopharyngeal swab specimens and should not be used as a sole basis for treatment. Nasal washings and aspirates are unacceptable for Xpert Xpress SARS-CoV-2/FLU/RSV testing.  Fact Sheet for Patients: bloggercourse.com  Fact Sheet for Healthcare Providers: seriousbroker.it  This test is not yet approved or cleared by the [place]  FDA and has been authorized for detection and/or diagnosis of SARS-CoV-2 by FDA under an Emergency Use Authorization (EUA). This EUA will remain in effect (meaning this test can be used) for the duration of the COVID-19 declaration  under Section 564(b)(1) of the Act, 21 U.S.C. section 360bbb-3(b)(1), unless the authorization is terminated or revoked.     Resp Syncytial Virus by PCR NEGATIVE NEGATIVE Final    Comment: (NOTE) Fact Sheet for Patients: bloggercourse.com  Fact Sheet for Healthcare Providers: seriousbroker.it  This test is not yet approved or cleared by the [place]  FDA and has been authorized for detection and/or diagnosis of SARS-CoV-2 by FDA under an Emergency Use Authorization (EUA). This EUA will remain in effect (meaning this  test can be used) for the duration of the COVID-19 declaration under Section 564(b)(1) of the Act, 21 U.S.C. section 360bbb-3(b)(1), unless the authorization is terminated or revoked.  Performed at [facility], [address], [place], [place] [address]   Blood Culture (routine x 2)     Status: Abnormal (Preliminary result)   Collection Time: 04/14/24 12:47 PM   Specimen: BLOOD  Result Value Ref Range Status   Specimen Description BLOOD SITE NOT SPECIFIED  Final   Special Requests   Final    BOTTLES DRAWN AEROBIC AND ANAEROBIC Blood Culture results may not be optimal due to an inadequate volume of blood received in culture bottles   Culture  Setup Time   Final    GRAM POSITIVE COCCI AEROBIC BOTTLE ONLY Performed at [facility], [address], [place], [place] [address]    Culture STAPHYLOCOCCUS SIMULANS (A)  Final   Report Status PENDING  Incomplete  C Difficile Quick Screen w PCR reflex     Status: None   Collection Time: 04/15/24 11:37 PM   Specimen: STOOL  Result Value Ref Range Status   C Diff antigen NEGATIVE NEGATIVE Final   C Diff toxin NEGATIVE NEGATIVE Final   C Diff interpretation No C. difficile detected.  Final    Comment: Performed at [facility], [address] [place], [place] [address]    Radiology Studies: CT PELVIS W CONTRAST Result Date: 04/14/2024 CLINICAL DATA:   Soft tissue infection EXAM: CT PELVIS WITH CONTRAST TECHNIQUE: Multidetector CT imaging of the pelvis was performed using the standard protocol following the bolus administration of intravenous contrast. RADIATION DOSE REDUCTION: This exam was performed according to the departmental dose-optimization program which includes automated exposure control, adjustment of the mA and/or kV according to patient size and/or use of iterative reconstruction technique. CONTRAST:  75mL OMNIPAQUE  IOHEXOL  350 MG/ML SOLN COMPARISON:  03/12/2022 FINDINGS: Urinary Tract: Bladder is decompressed with a Foley catheter. Distal ureters are unremarkable. Bowel: No bowel obstruction or ileus. Retained stool within the rectal vault may reflect fecal impaction. No evidence of stercoral colitis. Vascular/Lymphatic: Atherosclerosis of the aorta and its branches. No pathologic adenopathy. Reproductive: Prostate is unremarkable. Incidental bilateral hydroceles. Other: No free fluid or free intraperitoneal gas. No abdominal wall hernia. Musculoskeletal: Diffuse osteopenia. Sacral decubitus ulcer extending 5.5 cm in craniocaudal length, with gas lucency extending to the dorsal cortex of the sacrococcygeal junction. I do not see any bony destruction to suggest osteomyelitis. Associated fat stranding extends in the presacral space, without fluid collection or abscess. Reconstructed images demonstrate no additional findings. IMPRESSION: 1. Large sacral decubitus ulcer, with fat stranding in the subcutaneous tissues and extending into the presacral space consistent with cellulitis. No bony destruction to suggest osteomyelitis. 2. Significant stool retention within the rectal wall, consistent with fecal impaction. 3.  Aortic Atherosclerosis (ICD10-I70.0). Electronically Signed   By: Ozell Daring M.D.   On: 04/14/2024 17:42   DG Chest Port 1 View Result Date: 04/14/2024 CLINICAL DATA:  Questionable sepsis. EXAM: PORTABLE CHEST 1 VIEW COMPARISON:   Chest radiograph dated 02/21/2024. FINDINGS: There is diffuse chronic intra coarsening in keeping with interstitial lung disease. Scattered areas of faint hazy densities primarily over the right mid to lower lung field may be chronic but suspicious for edema or pneumonia. Small right pleural effusion. No pneumothorax. Stable cardiomegaly. Median sternotomy wires. No acute osseous pathology. IMPRESSION: 1. Chronic interstitial lung disease with superimposed edema or pneumonia. 2. Small right pleural effusion.  Electronically Signed   By: Vanetta Chou M.D.   On: 04/14/2024 14:35   Scheduled Meds:  Chlorhexidine  Gluconate Cloth  6 each Topical Q0600   collagenase    Topical Daily   [START ON 04/13/2024] collagenase    Topical Daily   ferrous sulfate   325 mg Oral Daily   FLUoxetine   20 mg Oral Daily   fluticasone  furoate-vilanterol  1 puff Inhalation Daily   folic acid   1 mg Oral Daily   liver oil-zinc  oxide   Topical BID   metoprolol  tartrate  12.5 mg Oral Daily   pantoprazole   40 mg Oral q morning   polyethylene glycol  17 g Oral Daily   senna-docusate  1 tablet Oral QHS   sodium hypochlorite   Irrigation Daily   Continuous Infusions:  sodium chloride  40 mL/hr at 04/15/24 1745   cefTRIAXone  (ROCEPHIN )  IV 1 g (04/16/24 1121)   linezolid  (ZYVOX ) IV 600 mg (04/15/24 2255)     LOS: 2 days    Time spent: 50 mins    [name], MD [facility]   If 7PM-7AM, please contact night-coverage  "

## 2024-04-17 DIAGNOSIS — Z711 Person with feared health complaint in whom no diagnosis is made: Secondary | ICD-10-CM | POA: Diagnosis not present

## 2024-04-17 DIAGNOSIS — Z515 Encounter for palliative care: Secondary | ICD-10-CM | POA: Diagnosis not present

## 2024-04-17 DIAGNOSIS — A419 Sepsis, unspecified organism: Secondary | ICD-10-CM | POA: Diagnosis not present

## 2024-04-17 DIAGNOSIS — Z7189 Other specified counseling: Secondary | ICD-10-CM | POA: Diagnosis not present

## 2024-04-17 LAB — CULTURE, BLOOD (ROUTINE X 2)

## 2024-04-17 NOTE — Plan of Care (Signed)
   Problem: Safety: Goal: Ability to remain free from injury will improve Outcome: Progressing

## 2024-04-17 NOTE — Plan of Care (Signed)
   Problem: Pain Managment: Goal: General experience of comfort will improve and/or be controlled Outcome: Progressing   Problem: Skin Integrity: Goal: Risk for impaired skin integrity will decrease Outcome: Progressing

## 2024-04-17 NOTE — Progress Notes (Signed)
 [name]  [name]   Evan [name] is a current hospice patient with a terminal diagnosis of cerebrovascular disease. He was brought to the ED after being lethargic and having a fever for over 24 hours. He was admitted for sepsis, wound cellulitis and aspiration pneumonia. Per Dr. Jeryl with [facility], this is a related hospital admission.    [facility] Liaison spoke with pts bedside RN Evan [name] prior to going into the room. He said that the pts son keeps telling him to give the pt his oral meds and the pt is NPO. Writer met with pts daughter and son at bedside and discussed the reason why he is unable to have anything by mouth because he may choke or aspirate. They verbalized understanding but may not comply. Palliative met with them again and it seems that they do realize that the patient is dying. Patient remains inpatient appropriate due to need for IV antibiotics.   Vital Signs: 97.7, 80, 16 ,109/60   SPO2 95% on 3L [place]   I&O:  from 1.13: 1269.5 (IV fluids only)/600   Abnormal Labs:  None new  Diagnostics:   None new    IV/PRN Meds:  Cefepime  1g IV Q24H Zyvox  600 mg Q 12 H Robitussin DM 5mg  Q4H x0   Problem list as per MD note, Evan Dawley, MD  04/17/2024: Assessment & Plan: Principal Problem:   Sepsis (HCC) Active Problems:   Atrial fibrillation, chronic (HCC)   Acute metabolic encephalopathy   Fecal impaction (HCC)   Anxiety and depression   Chronic diastolic HF (heart failure) (HCC)   Acute on chronic hypoxic respiratory failure (HCC)   Sepsis: Multi-Factorial: Patient presented with fevers, tachycardia, leukocytosis, decreased mentation and lethargy. Findings consistent with worsening wound infection / cellulitis and evidence of possible aspiration pneumonia. Continue IV linezolid  and Rocephin . Continue IV hydration with NS 40cc/hr Cultures grew Staphylococcus simulans, sensitivity pending. Trend CBC, fever curve.    Cellulitis: Sacral wound infection: Patient with history of sacral decubitus ulcer, not currently managed in the outpatient due to patient being on hospice presented with worsening wound and lethargic. CT pelvics shows a large sacral decubitus ulcer with fat stranding in the subcutaneous tissues and extending into the presacral space consistent with cellulitis but no osteomyelitis. Empiric antibiotics as above. Wound care consult   Aspiration Pneumonia: Patient with history of recurrent aspirations presenting with recent cough, congestion and found to have increased O2 requirement CXR shows signs of possible pneumonia, likely aspiration due to history of being bed bound. Continue IV antibiotics as above. Mucinex  DM and as needed Robitussin for cough. Aspiration precaution.   Acute on chronic hypoxic respiratory failure: COPD: Patient with increased O2 requirement from baseline 2 L [place] to 4 L [place] This is likely in the setting of pneumonia. Continue home bronchodilator, start as needed DuoNeb Continue supplemental O2, wean as able   Chronic urinary retention: Status post chronic Foley, per family, foley changed every month by hospice, last changed on 03/17/24 Urinalysis shows some signs of infection, improved from 2 months ago, likely colonized Consider exchange of Foley catheter prior to discharge.   Acute metabolic encephalopathy: Patient with lethargy and decreased responsiveness in the setting of sepsis. Alert, Arousable , oriented to self and person. Delirium precaution    Disposition Plan: This will depend on hospital course    Discharge Planning: Ongoing   Family Contact: spoke with son and daughter at bedside   IDT: updated   Goals of  Care: DNR     Should patient need ambulance transfer at discharge - please use [facility]) as they contract this service for our active hospice patients.   Greig Basket, BSN, RN [facility] Liaison [phone]

## 2024-04-17 NOTE — Progress Notes (Signed)
" ° °  Palliative Medicine Inpatient Follow Up Note HPI: Evan [name] is a 89 year old male with a past medical history significant for coronary artery disease, hypertension, hyperlipidemia, CABG, A-fib on Eliquis , left thalamus stroke, CKD stage IIIb, congestive heart failure, recurrent aspirations, chronic hypoxic respiratory failure on 2 L nasal cannula at baseline, & previous history of lung cancer with radiation.  [name] had been an established [facility] patient since November and was admitted to the hospital in the setting of sepsis from aspiration pneumonia as well as sacral wound infection.  Palliative care has been asked to get involved to clarify goals of care in the setting of patient being established with hospice.   Today's Discussion 04/17/2024  I reviewed the chart notes including nursing notes from Jun Guiral, progress notes from Dr. Jillian. I also reviewed vital signs which have been table, nursing flowsheets - not eating or drinking, medication administrations record, no recent labs or imaging studies.   I met this morning with [name]. He is hypophonic but in no discomfort. He remains to be generally frail in appearance. He has warm extremities with good distal pulses.   I spoke to patients son, [name] who shares that his father is dying. He shares that his father is not wanting to eat or drink anything. He shares understanding that this is a marker that end of life is nearing.  Created space and opportunity for patients son to explore thoughts feelings and fears regarding [name]'s current medical situation. He states that he knows what is happening and what to expect. He does not want his father to suffer and wishes his other families would be on the same page. I offered him emotional support through therapeutic listening.  Re-iterated the plan to optimize patient from an infection standpoint prior to discharge home.   Questions and concerns addressed/Palliative Support Provided.    Objective Assessment: Vital Signs Vitals:   04/17/24 0555 04/17/24 1043  BP: 90/61 109/60  Pulse: 89 80  Resp: 14 16  Temp: 97.9 F (36.6 C) 97.7 F (36.5 C)  SpO2: 100% 100%    Intake/Output Summary (Last 24 hours) at 04/17/2024 1413 Last data filed at 04/17/2024 0553 Gross per 24 hour  Intake 993.24 ml  Output 600 ml  Net 393.24 ml   Gen: Chronically ill-appearing elderly Caucasian male HEENT: Dry mucous membranes CV: Regular rate and rhythm PULM: On 4 L nasal cannula breathing is even and nonlabored ABD: soft/nontender EXT: Muscle wasting noted in all 4 extremities Neuro: Somnolent    SUMMARY OF RECOMMENDATIONS   DNAR/DNI  Continue current care with maintenance IV fluids and IV antibiotics until course completion to be determined by hospitalist   Patient's family would like to take Charlis back home with [facility] hospice once medically optimized   The PMT will remain peripheral unless needed  ______________________________________________________________________________________ [facility] [place] Palliative Medicine Team Team Cell Phone: [phone] Please utilize secure chat with additional questions, if there is no response within 30 minutes please call the above phone number  I personally spent a total of 31 minutes in the care of the patient today including preparing to see the patient, performing a medically appropriate exam/evaluation, referring and communicating with other health care professionals, documenting clinical information in the EHR, independently interpreting results, and coordinating care.     "

## 2024-04-17 NOTE — Progress Notes (Signed)
 Pt has morning meds to be given. Pt is alert but not oriented, able top speak. This RN did a swallow test to evaluate the pt's swallowing ability, but pt cough when a small amount of water from a spoon was given. Son at bedside was informed and RN hold the meds at the moment. Son wants RN to give the med despite the assessment, but this RN refused. MD was notified to switch PO med to IV if possible.

## 2024-04-17 NOTE — Progress Notes (Signed)
" PROGRESS NOTE    Evan [name]  FMW:[id number] DOB: Apr 26, 1927 DOA: 04/14/2024 PCP: [name], MD   Brief Narrative:  This 89 y.o. male with medical history significant for HTN, HLD, CAD s/p CABG, A-fib on Eliquis , left thalamic stroke currently bedbound, pressure ulcer, CKD 3B, diastolic CHF, chronic hypoxic respiratory failure on 2 L [place], remote history of lung cancer, recurrent aspiration, anxiety and depression presented to the ED for evaluation of fever and lethargy. Per family, patient currently receiving home hospice and yesterday, he had a low-grade fever with shaking but was at baseline mental status. This morning, he had a fever of 100.4 and seems to be more lethargic, refusing to eat or drink.  Family reports he has also had some congestion and cough recently  They did increase his oxygen  from 2 L to 4 L [place]. States patient just completed 3 days of ciprofloxacin  and has had some loose stools with it.  at the bedside, patient complains of feeling cold but no other complaints.  He was febrile, tachycardic, tachypneic , hypotensive and hypoxic in the ED.  UA consistent with UTI.  Chest x-ray shows chronic interstitial lung disease with superimposed edema or pneumonia. CT pelvics shows large sacral decubitus ulcer with fat stranding in the subcutaneous tissues and extending to the presacral space consistent with cellulitis but no bony destruction to suggest osteomyelitis, significant stool retention within the rectal wall consistent with fecal impaction.  Patient was admitted for further evaluation and started on empiric antibiotics.  Assessment & Plan:   Principal Problem:   Sepsis (HCC) Active Problems:   Atrial fibrillation, chronic (HCC)   Acute metabolic encephalopathy   Fecal impaction (HCC)   Anxiety and depression   Chronic diastolic HF (heart failure) (HCC)   Acute on chronic hypoxic respiratory failure (HCC)  Sepsis: Multi-Factorial: Patient presented with fevers,  tachycardia, leukocytosis, decreased mentation and lethargy. Findings consistent with worsening wound infection / cellulitis and evidence of possible aspiration pneumonia. Continue IV linezolid  and Rocephin . Continue IV hydration with NS 40cc/hr Cultures grew Staphylococcus simulans, sensitivity pending. Trend CBC, fever curve.   Cellulitis: Sacral wound infection: Patient with history of sacral decubitus ulcer, not currently managed in the outpatient due to patient being on hospice presented with worsening wound and lethargic. CT pelvics shows a large sacral decubitus ulcer with fat stranding in the subcutaneous tissues and extending into the presacral space consistent with cellulitis but no osteomyelitis. Empiric antibiotics as above. Wound care consult   Aspiration Pneumonia: Patient with history of recurrent aspirations presenting with recent cough, congestion and found to have increased O2 requirement CXR shows signs of possible pneumonia, likely aspiration due to history of being bed bound. Continue IV antibiotics as above. Mucinex  DM and as needed Robitussin for cough. Aspiration precaution.   Acute on chronic hypoxic respiratory failure: COPD: Patient with increased O2 requirement from baseline 2 L [place] to 4 L [place] This is likely in the setting of pneumonia. Continue home bronchodilator, start as needed DuoNeb Continue supplemental O2, wean as able   Chronic urinary retention: Status post chronic Foley, per family, foley changed every month by hospice, last changed on 03/17/24 Urinalysis shows some signs of infection, improved from 2 months ago, likely colonized Consider exchange of Foley catheter prior to discharge.   Acute metabolic encephalopathy: Patient with lethargy and decreased responsiveness in the setting of sepsis. Alert, Arousable , oriented to self and person. Delirium precaution   Fecal impaction CT of the pelvis showed significant  stool retention within the  rectal wall consistent with fecal impaction Patient had a good bowel movement during my assessment. Continue daily MiraLAX  and Senokot-S, escalate as needed   A-fib: EKG shows A-fib, HR stable Continue Eliquis  and Lopressor  Trend and replete electrolytes   Chronic diastolic HF: Hypertension: Last TTE in 11/25 shows EF 55-60%, moderately dilated LA, severely dilated RA and moderate TVR BP initially soft but stable with SBP in the 110-130s   Macrocytic anemia Hgb of 8.7, around baseline of 8-9. Continue iron  and folate supplementation Transfuse for Hgb >7  Chronic thrombocytopenia Platelet of 54, stable compared to recent baseline of 40 to 50s Trend CBC   QTc prolongation EKG on admission shows prolonged QTc of 562 Avoid QTc prolonging meds Telemetry   Hx of depression: Continue Prozac    GAD: Continue as needed Xanax    GERD Continue Protonix .  Goals of care discussion: Patient does have significant comorbidities and severe deconditioning and also has high risk for aspiration.  Patient is n.p.o. family does not want PEG tube.  Palliative care is consulted to discuss with family about goals of care. I would think hospice would be appropriate for this patient.    DVT prophylaxis: Eliquis  Code Status:DNR Family Communication: Daughter at bedside Disposition Plan:   Status is: Inpatient Remains inpatient appropriate because: Patient admitted for sepsis secondary to decubitus ulcer and UTI.   He is not medically ready for discharge yet  Consultants:  Wound care  Procedures:  Antimicrobials: Anti-infectives (From admission, onward)    Start     Dose/Rate Route Frequency Ordered Stop   04/15/24 1000  cefTRIAXone  (ROCEPHIN ) 1 g in sodium chloride  0.9 % 100 mL IVPB        1 g 200 mL/hr over 30 Minutes Intravenous Every 24 hours 04/15/24 0051     04/14/24 2330  linezolid  (ZYVOX ) IVPB 600 mg        600 mg 300 mL/hr over 60 Minutes Intravenous Every 12 hours 04/14/24  2311     04/14/24 1500  cefTRIAXone  (ROCEPHIN ) 1 g in sodium chloride  0.9 % 100 mL IVPB        1 g 200 mL/hr over 30 Minutes Intravenous  Once 04/14/24 1447 04/14/24 1542       Subjective: Patient was seen and examined at bedside.  Overnight events noted. Patient is severely deconditioned,  alert and arousable but very confused. Son at bedside.  Objective: Vitals:   04/16/24 1725 04/16/24 2100 04/17/24 0555 04/17/24 1043  BP: (!) 109/59 (!) 111/58 90/61 109/60  Pulse: 79  89 80  Resp: 16 16 14 16   Temp: 98.2 F (36.8 C) 97.9 F (36.6 C) 97.9 F (36.6 C) 97.7 F (36.5 C)  TempSrc:  Oral Oral   SpO2:  95% 100% 100%    Intake/Output Summary (Last 24 hours) at 04/17/2024 1210 Last data filed at 04/17/2024 0553 Gross per 24 hour  Intake 993.24 ml  Output 600 ml  Net 393.24 ml   There were no vitals filed for this visit.  Examination:  General exam: Appears calm and comfortable, Severely deconditioned, not in any acute distress Respiratory system: Decreased breath sounds. Respiratory effort normal.  RR 14 Cardiovascular system: S1 & S2 heard, RRR. No JVD, murmurs, rubs, gallops or clicks.  Gastrointestinal system: Abdomen is non distended, soft and non  tender.Normal bowel sounds heard. Central nervous system: Alert and oriented x 1. No focal neurological deficits. Extremities: No edema, No cyanosis, No clubbing  Skin: No rashes, lesions  or ulcers Psychiatry: Not assessed.  Data Reviewed: I have personally reviewed following labs and imaging studies  CBC: Recent Labs  Lab 04/14/24 1242 04/15/24 0516 04/16/24 0609  WBC 6.7 6.7 5.4  NEUTROABS 5.5  --   --   HGB 8.7* 8.8* 8.9*  HCT 27.4* 27.4* 27.2*  MCV 106.6* 106.6* 104.6*  PLT 54* 44* 41*   Basic Metabolic Panel: Recent Labs  Lab 04/14/24 1242 04/15/24 0516  NA 136 138  K 3.7 4.2  CL 104 105  CO2 21* 21*  GLUCOSE 115* 129*  BUN 32* 31*  CREATININE 1.15 1.19  CALCIUM  8.8* 8.8*  MG  --  1.7  PHOS  --   3.9   GFR: CrCl cannot be calculated (Unknown ideal weight.). Liver Function Tests: Recent Labs  Lab 04/14/24 1242  AST 22  ALT 6  ALKPHOS 186*  BILITOT 1.1  PROT 6.3*  ALBUMIN 2.6*   No results for input(s): LIPASE, AMYLASE in the last 168 hours. No results for input(s): AMMONIA in the last 168 hours. Coagulation Profile: Recent Labs  Lab 04/14/24 1242  INR 1.9*   Cardiac Enzymes: No results for input(s): CKTOTAL, CKMB, CKMBINDEX, TROPONINI in the last 168 hours. BNP (last 3 results) No results for input(s): PROBNP in the last 8760 hours. HbA1C: No results for input(s): HGBA1C in the last 72 hours. CBG: No results for input(s): GLUCAP in the last 168 hours. Lipid Profile: No results for input(s): CHOL, HDL, LDLCALC, TRIG, CHOLHDL, LDLDIRECT in the last 72 hours. Thyroid  Function Tests: No results for input(s): TSH, T4TOTAL, FREET4, T3FREE, THYROIDAB in the last 72 hours. Anemia Panel: No results for input(s): VITAMINB12, FOLATE, FERRITIN, TIBC, IRON , RETICCTPCT in the last 72 hours. Sepsis Labs: Recent Labs  Lab 04/14/24 1346  LATICACIDVEN 1.5    Recent Results (from the past 240 hours)  Blood Culture (routine x 2)     Status: Abnormal (Preliminary result)   Collection Time: 04/14/24 12:42 PM   Specimen: BLOOD  Result Value Ref Range Status   Specimen Description BLOOD SITE NOT SPECIFIED  Final   Special Requests   Final    BOTTLES DRAWN AEROBIC AND ANAEROBIC Blood Culture results may not be optimal due to an inadequate volume of blood received in culture bottles   Culture  Setup Time   Final    GRAM POSITIVE COCCI IN BOTH AEROBIC AND ANAEROBIC BOTTLES Organism ID to follow CRITICAL RESULT CALLED TO, READ BACK BY AND VERIFIED WITHBETHA KYM BARTER PHARMD, AT 9244 04/16/23 D. VANHOOK GRAM POSITIVE RODS AEROBIC BOTTLE ONLY Gram Stain Report Called to,Read Back By and Verified With: PHARMD J LEDFORD 04/15/2024 @  2159 BY AB Performed at [facility], [address], [place], [place] [address]    Culture STAPHYLOCOCCUS SIMULANS (A)  Final   Report Status PENDING  Incomplete   Organism ID, Bacteria STAPHYLOCOCCUS SIMULANS  Final      Susceptibility   Staphylococcus simulans - MIC*    CIPROFLOXACIN  >=8 RESISTANT Resistant     ERYTHROMYCIN  <=0.25 SENSITIVE Sensitive     GENTAMICIN  <=0.5 SENSITIVE Sensitive     OXACILLIN >=4 RESISTANT Resistant     TETRACYCLINE <=1 SENSITIVE Sensitive     VANCOMYCIN  <=0.5 SENSITIVE Sensitive     TRIMETH /SULFA  <=10 SENSITIVE Sensitive     CLINDAMYCIN <=0.25 SENSITIVE Sensitive     RIFAMPIN <=0.5 SENSITIVE Sensitive     Inducible Clindamycin NEGATIVE Sensitive     * STAPHYLOCOCCUS SIMULANS  Urine Culture  Status: None   Collection Time: 04/14/24 12:42 PM   Specimen: Urine, Random  Result Value Ref Range Status   Specimen Description URINE, RANDOM  Final   Special Requests NONE Reflexed from [id number]  Final   Culture   Final    NO GROWTH Performed at [facility], [address], [place], [place] [address]    Report Status 04/15/2024 FINAL  Final  Blood Culture ID Panel (Reflexed)     Status: Abnormal   Collection Time: 04/14/24 12:42 PM  Result Value Ref Range Status   Enterococcus faecalis NOT DETECTED NOT DETECTED Final   Enterococcus Faecium NOT DETECTED NOT DETECTED Final   Listeria monocytogenes NOT DETECTED NOT DETECTED Final   Staphylococcus species DETECTED (A) NOT DETECTED Final    Comment: CRITICAL RESULT CALLED TO, READ BACK BY AND VERIFIED WITHBETHA KYM BARTER PHARMD, AT 0755 04/16/23 D. VANHOOK    Staphylococcus aureus (BCID) NOT DETECTED NOT DETECTED Final   Staphylococcus epidermidis NOT DETECTED NOT DETECTED Final   Staphylococcus lugdunensis NOT DETECTED NOT DETECTED Final   Streptococcus species NOT DETECTED NOT DETECTED Final   Streptococcus agalactiae NOT DETECTED NOT DETECTED Final   Streptococcus pneumoniae NOT DETECTED NOT  DETECTED Final   Streptococcus pyogenes NOT DETECTED NOT DETECTED Final   A.calcoaceticus-baumannii NOT DETECTED NOT DETECTED Final   Bacteroides fragilis NOT DETECTED NOT DETECTED Final   Enterobacterales NOT DETECTED NOT DETECTED Final   Enterobacter cloacae complex NOT DETECTED NOT DETECTED Final   Escherichia coli NOT DETECTED NOT DETECTED Final   Klebsiella aerogenes NOT DETECTED NOT DETECTED Final   Klebsiella oxytoca NOT DETECTED NOT DETECTED Final   Klebsiella pneumoniae NOT DETECTED NOT DETECTED Final   Proteus species NOT DETECTED NOT DETECTED Final   Salmonella species NOT DETECTED NOT DETECTED Final   Serratia marcescens NOT DETECTED NOT DETECTED Final   Haemophilus influenzae NOT DETECTED NOT DETECTED Final   Neisseria meningitidis NOT DETECTED NOT DETECTED Final   Pseudomonas aeruginosa NOT DETECTED NOT DETECTED Final   Stenotrophomonas maltophilia NOT DETECTED NOT DETECTED Final   Candida albicans NOT DETECTED NOT DETECTED Final   Candida auris NOT DETECTED NOT DETECTED Final   Candida glabrata NOT DETECTED NOT DETECTED Final   Candida krusei NOT DETECTED NOT DETECTED Final   Candida parapsilosis NOT DETECTED NOT DETECTED Final   Candida tropicalis NOT DETECTED NOT DETECTED Final   Cryptococcus neoformans/gattii NOT DETECTED NOT DETECTED Final    Comment: Performed at [facility], [address], [place], [place] [address]  Resp panel by RT-PCR (RSV, Flu A&B, Covid) Anterior Nasal Swab     Status: None   Collection Time: 04/14/24 12:43 PM   Specimen: Anterior Nasal Swab  Result Value Ref Range Status   SARS Coronavirus 2 by RT PCR NEGATIVE NEGATIVE Final   Influenza A by PCR NEGATIVE NEGATIVE Final   Influenza B by PCR NEGATIVE NEGATIVE Final    Comment: (NOTE) The Xpert Xpress SARS-CoV-2/FLU/RSV plus assay is intended as an aid in the diagnosis of influenza from Nasopharyngeal swab specimens and should not be used as a sole basis for treatment. Nasal washings  and aspirates are unacceptable for Xpert Xpress SARS-CoV-2/FLU/RSV testing.  Fact Sheet for Patients: bloggercourse.com  Fact Sheet for Healthcare Providers: seriousbroker.it  This test is not yet approved or cleared by the [place]  FDA and has been authorized for detection and/or diagnosis of SARS-CoV-2 by FDA under an Emergency Use Authorization (EUA). This EUA will remain in effect (meaning  this test can be used) for the duration of the COVID-19 declaration under Section 564(b)(1) of the Act, 21 U.S.C. section 360bbb-3(b)(1), unless the authorization is terminated or revoked.     Resp Syncytial Virus by PCR NEGATIVE NEGATIVE Final    Comment: (NOTE) Fact Sheet for Patients: bloggercourse.com  Fact Sheet for Healthcare Providers: seriousbroker.it  This test is not yet approved or cleared by the [place]  FDA and has been authorized for detection and/or diagnosis of SARS-CoV-2 by FDA under an Emergency Use Authorization (EUA). This EUA will remain in effect (meaning this test can be used) for the duration of the COVID-19 declaration under Section 564(b)(1) of the Act, 21 U.S.C. section 360bbb-3(b)(1), unless the authorization is terminated or revoked.  Performed at [facility], [address] [place], [place] [address]   Blood Culture (routine x 2)     Status: Abnormal   Collection Time: 04/14/24 12:47 PM   Specimen: BLOOD  Result Value Ref Range Status   Specimen Description BLOOD SITE NOT SPECIFIED  Final   Special Requests   Final    BOTTLES DRAWN AEROBIC AND ANAEROBIC Blood Culture results may not be optimal due to an inadequate volume of blood received in culture bottles   Culture  Setup Time GRAM POSITIVE COCCI AEROBIC BOTTLE ONLY   Final   Culture (A)  Final    STAPHYLOCOCCUS SIMULANS SUSCEPTIBILITIES PERFORMED ON PREVIOUS CULTURE WITHIN THE LAST  5 DAYS. Performed at [facility], [address], [place], [place] [address]    Report Status 04/17/2024 FINAL  Final  C Difficile Quick Screen w PCR reflex     Status: None   Collection Time: 04/15/24 11:37 PM   Specimen: STOOL  Result Value Ref Range Status   C Diff antigen NEGATIVE NEGATIVE Final   C Diff toxin NEGATIVE NEGATIVE Final   C Diff interpretation No C. difficile detected.  Final    Comment: Performed at [facility], [address], [place], [place] [address]  MRSA Next Gen by PCR, Nasal     Status: Abnormal   Collection Time: 04/16/24 11:22 AM   Specimen: Nasal Mucosa; Nasal Swab  Result Value Ref Range Status   MRSA by PCR Next Gen DETECTED (A) NOT DETECTED Final    Comment: RESULT CALLED TO, READ BACK BY AND VERIFIED WITH: RN Eleanore MATSU on [phone] @1518  by SM (NOTE) The GeneXpert MRSA Assay (FDA approved for NASAL specimens only), is one component of a comprehensive MRSA colonization surveillance program. It is not intended to diagnose MRSA infection nor to guide or monitor treatment for MRSA infections. Test performance is not FDA approved in patients less than 9 years old. Performed at [facility], [address], [place], [place] [address]     Radiology Studies: No results found.  Scheduled Meds:  apixaban   5 mg Oral BID   Chlorhexidine  Gluconate Cloth  6 each Topical Q0600   collagenase    Topical Daily   [START ON 04/15/2024] collagenase    Topical Daily   ferrous sulfate   325 mg Oral Daily   FLUoxetine   20 mg Oral Daily   fluticasone  furoate-vilanterol  1 puff Inhalation Daily   folic acid   1 mg Oral Daily   liver oil-zinc  oxide   Topical BID   metoprolol  tartrate  12.5 mg Oral Daily   mupirocin  ointment  1 Application Nasal BID   pantoprazole   40 mg Oral q morning   polyethylene glycol  17  g Oral Daily   senna-docusate  1 tablet Oral QHS   sodium hypochlorite   Irrigation Daily   Continuous Infusions:  sodium chloride  40 mL/hr at  04/17/24 0310   cefTRIAXone  (ROCEPHIN )  IV 1 g (04/17/24 1014)   linezolid  (ZYVOX ) IV 600 mg (04/17/24 1142)     LOS: 3 days    Time spent: 35 mins    [name], MD [facility]   If 7PM-7AM, please contact night-coverage  "

## 2024-04-18 DIAGNOSIS — A419 Sepsis, unspecified organism: Secondary | ICD-10-CM | POA: Diagnosis not present

## 2024-04-18 LAB — CULTURE, BLOOD (ROUTINE X 2)

## 2024-04-24 ENCOUNTER — Other Ambulatory Visit: Payer: Self-pay | Admitting: Family Medicine

## 2024-04-24 DIAGNOSIS — I5021 Acute systolic (congestive) heart failure: Secondary | ICD-10-CM

## 2024-05-05 NOTE — Death Summary Note (Signed)
"  Death Summary  KWELI [name] FMW:[id number] DOB: 29-Apr-1927 DOA: 2024/04/26  PCP: [name], MD  Admit date: April 26, 2024  Date of Death: 04-30-24  Time of Death: 09:00  Notification: [name], MD notified of death of Apr 30, 2024   History of present illness:  Evan [name] is a 89 y.o. male with a history of HTN, HLD, CAD s/p CABG, A-fib on Eliquis , left thalamic stroke currently bedbound, pressure ulcer, CKD 3B, diastolic CHF, chronic hypoxic respiratory failure on 2 L [place], remote history of lung cancer, recurrent aspiration, anxiety and depression presented to the ED for evaluation of fever and lethargy. Per family, patient currently receiving home hospice and , he had a low-grade fever with shaking but was at baseline mental status. This morning, he had a fever of 100.4 and seems to be more lethargic, refusing to eat or drink.  Family reports he has also had some congestion and cough recently  They did increase his oxygen  from 2 L to 4 L . States patient just completed 3 days of ciprofloxacin  and has had some loose stools with it.  at the bedside, patient complains of feeling cold but no other complaints.  He was febrile, tachycardic, tachypneic , hypotensive and hypoxic in the ED.  UA consistent with UTI.  Chest x-ray shows chronic interstitial lung disease with superimposed edema or pneumonia. CT pelvics shows large sacral decubitus ulcer with fat stranding in the subcutaneous tissues and extending to the presacral space consistent with cellulitis but no bony destruction to suggest osteomyelitis, significant stool retention within the rectal wall consistent with fecal impaction.  Patient was admitted for further evaluation and started on empiric antibiotics.  Patient had downward decline.  There was no meaningful improvement.  Palliative care consulted.  Family meeting conducted.  Family was anticipating recovery and want patient to be discharged home with home hospice.  Patient  passed away 05-01-23 at 9 AM.  Family was at bedside.   Final Diagnoses:  1.  Severe sepsis 2.  Aspiration pneumonia 3.  Chronic thrombocytopenia 4.  CAD, s/p CABG   The results of significant diagnostics from this hospitalization (including imaging, microbiology, ancillary and laboratory) are listed below for reference.    Significant Diagnostic Studies: CT PELVIS W CONTRAST Result Date: 04-26-24 CLINICAL DATA:  Soft tissue infection EXAM: CT PELVIS WITH CONTRAST TECHNIQUE: Multidetector CT imaging of the pelvis was performed using the standard protocol following the bolus administration of intravenous contrast. RADIATION DOSE REDUCTION: This exam was performed according to the departmental dose-optimization program which includes automated exposure control, adjustment of the mA and/or kV according to patient size and/or use of iterative reconstruction technique. CONTRAST:  75mL OMNIPAQUE  IOHEXOL  350 MG/ML SOLN COMPARISON:  03/12/2022 FINDINGS: Urinary Tract: Bladder is decompressed with a Foley catheter. Distal ureters are unremarkable. Bowel: No bowel obstruction or ileus. Retained stool within the rectal vault may reflect fecal impaction. No evidence of stercoral colitis. Vascular/Lymphatic: Atherosclerosis of the aorta and its branches. No pathologic adenopathy. Reproductive: Prostate is unremarkable. Incidental bilateral hydroceles. Other: No free fluid or free intraperitoneal gas. No abdominal wall hernia. Musculoskeletal: Diffuse osteopenia. Sacral decubitus ulcer extending 5.5 cm in craniocaudal length, with gas lucency extending to the dorsal cortex of the sacrococcygeal junction. I do not see any bony destruction to suggest osteomyelitis. Associated fat stranding extends in the presacral space, without fluid collection or abscess. Reconstructed images demonstrate no additional findings. IMPRESSION: 1. Large sacral decubitus ulcer, with fat stranding in the subcutaneous tissues  and  extending into the presacral space consistent with cellulitis. No bony destruction to suggest osteomyelitis. 2. Significant stool retention within the rectal wall, consistent with fecal impaction. 3.  Aortic Atherosclerosis (ICD10-I70.0). Electronically Signed   By: Ozell Daring M.D.   On: 04/14/2024 17:42   DG Chest Port 1 View Result Date: 04/14/2024 CLINICAL DATA:  Questionable sepsis. EXAM: PORTABLE CHEST 1 VIEW COMPARISON:  Chest radiograph dated 02/21/2024. FINDINGS: There is diffuse chronic intra coarsening in keeping with interstitial lung disease. Scattered areas of faint hazy densities primarily over the right mid to lower lung field may be chronic but suspicious for edema or pneumonia. Small right pleural effusion. No pneumothorax. Stable cardiomegaly. Median sternotomy wires. No acute osseous pathology. IMPRESSION: 1. Chronic interstitial lung disease with superimposed edema or pneumonia. 2. Small right pleural effusion. Electronically Signed   By: Vanetta Chou M.D.   On: 04/14/2024 14:35    Microbiology: Recent Results (from the past 240 hours)  Blood Culture (routine x 2)     Status: Abnormal   Collection Time: 04/14/24 12:42 PM   Specimen: BLOOD  Result Value Ref Range Status   Specimen Description BLOOD SITE NOT SPECIFIED  Final   Special Requests   Final    BOTTLES DRAWN AEROBIC AND ANAEROBIC Blood Culture results may not be optimal due to an inadequate volume of blood received in culture bottles   Culture  Setup Time   Final    GRAM POSITIVE COCCI IN BOTH AEROBIC AND ANAEROBIC BOTTLES Organism ID to follow CRITICAL RESULT CALLED TO, READ BACK BY AND VERIFIED WITHBETHA KYM BARTER PHARMD, AT 9244 04/16/23 D. VANHOOK GRAM POSITIVE RODS AEROBIC BOTTLE ONLY Gram Stain Report Called to,Read Back By and Verified With: PHARMD J LEDFORD 04/15/2024 @ 2159 BY AB    Culture (A)  Final    STAPHYLOCOCCUS SIMULANS THE SIGNIFICANCE OF ISOLATING THIS ORGANISM FROM A SINGLE SET OF BLOOD  CULTURES WHEN MULTIPLE SETS ARE DRAWN IS UNCERTAIN. PLEASE NOTIFY THE MICROBIOLOGY DEPARTMENT WITHIN ONE WEEK IF SPECIATION AND SENSITIVITIES ARE REQUIRED. DIPHTHEROIDS(CORYNEBACTERIUM SPECIES) Standardized susceptibility testing for this organism is not available. Performed at [facility], [address], [place], [place] [address]    Report Status 05/04/2024 FINAL  Final   Organism ID, Bacteria STAPHYLOCOCCUS SIMULANS  Final      Susceptibility   Staphylococcus simulans - MIC*    CIPROFLOXACIN  >=8 RESISTANT Resistant     ERYTHROMYCIN  <=0.25 SENSITIVE Sensitive     GENTAMICIN  <=0.5 SENSITIVE Sensitive     OXACILLIN >=4 RESISTANT Resistant     TETRACYCLINE <=1 SENSITIVE Sensitive     VANCOMYCIN  <=0.5 SENSITIVE Sensitive     TRIMETH /SULFA  <=10 SENSITIVE Sensitive     CLINDAMYCIN <=0.25 SENSITIVE Sensitive     RIFAMPIN <=0.5 SENSITIVE Sensitive     Inducible Clindamycin NEGATIVE Sensitive     * STAPHYLOCOCCUS SIMULANS  Urine Culture     Status: None   Collection Time: 04/14/24 12:42 PM   Specimen: Urine, Random  Result Value Ref Range Status   Specimen Description URINE, RANDOM  Final   Special Requests NONE Reflexed from [id number]  Final   Culture   Final    NO GROWTH Performed at [facility], [address], [place], [place] [address]    Report Status 04/15/2024 FINAL  Final  Blood Culture ID Panel (Reflexed)     Status: Abnormal   Collection Time: 04/14/24 12:42 PM  Result Value Ref Range Status   Enterococcus faecalis NOT  DETECTED NOT DETECTED Final   Enterococcus Faecium NOT DETECTED NOT DETECTED Final   Listeria monocytogenes NOT DETECTED NOT DETECTED Final   Staphylococcus species DETECTED (A) NOT DETECTED Final    Comment: CRITICAL RESULT CALLED TO, READ BACK BY AND VERIFIED WITHBETHA KYM BARTER PHARMD, AT 9244 04/16/23 D. VANHOOK    Staphylococcus aureus (BCID) NOT DETECTED NOT DETECTED Final   Staphylococcus epidermidis NOT DETECTED NOT DETECTED Final    Staphylococcus lugdunensis NOT DETECTED NOT DETECTED Final   Streptococcus species NOT DETECTED NOT DETECTED Final   Streptococcus agalactiae NOT DETECTED NOT DETECTED Final   Streptococcus pneumoniae NOT DETECTED NOT DETECTED Final   Streptococcus pyogenes NOT DETECTED NOT DETECTED Final   A.calcoaceticus-baumannii NOT DETECTED NOT DETECTED Final   Bacteroides fragilis NOT DETECTED NOT DETECTED Final   Enterobacterales NOT DETECTED NOT DETECTED Final   Enterobacter cloacae complex NOT DETECTED NOT DETECTED Final   Escherichia coli NOT DETECTED NOT DETECTED Final   Klebsiella aerogenes NOT DETECTED NOT DETECTED Final   Klebsiella oxytoca NOT DETECTED NOT DETECTED Final   Klebsiella pneumoniae NOT DETECTED NOT DETECTED Final   Proteus species NOT DETECTED NOT DETECTED Final   Salmonella species NOT DETECTED NOT DETECTED Final   Serratia marcescens NOT DETECTED NOT DETECTED Final   Haemophilus influenzae NOT DETECTED NOT DETECTED Final   Neisseria meningitidis NOT DETECTED NOT DETECTED Final   Pseudomonas aeruginosa NOT DETECTED NOT DETECTED Final   Stenotrophomonas maltophilia NOT DETECTED NOT DETECTED Final   Candida albicans NOT DETECTED NOT DETECTED Final   Candida auris NOT DETECTED NOT DETECTED Final   Candida glabrata NOT DETECTED NOT DETECTED Final   Candida krusei NOT DETECTED NOT DETECTED Final   Candida parapsilosis NOT DETECTED NOT DETECTED Final   Candida tropicalis NOT DETECTED NOT DETECTED Final   Cryptococcus neoformans/gattii NOT DETECTED NOT DETECTED Final    Comment: Performed at [facility], [address], [place], [place] [address]  Resp panel by RT-PCR (RSV, Flu A&B, Covid) Anterior Nasal Swab     Status: None   Collection Time: 04/14/24 12:43 PM   Specimen: Anterior Nasal Swab  Result Value Ref Range Status   SARS Coronavirus 2 by RT PCR NEGATIVE NEGATIVE Final   Influenza A by PCR NEGATIVE NEGATIVE Final   Influenza B by PCR NEGATIVE NEGATIVE Final     Comment: (NOTE) The Xpert Xpress SARS-CoV-2/FLU/RSV plus assay is intended as an aid in the diagnosis of influenza from Nasopharyngeal swab specimens and should not be used as a sole basis for treatment. Nasal washings and aspirates are unacceptable for Xpert Xpress SARS-CoV-2/FLU/RSV testing.  Fact Sheet for Patients: bloggercourse.com  Fact Sheet for Healthcare Providers: seriousbroker.it  This test is not yet approved or cleared by the [place]  FDA and has been authorized for detection and/or diagnosis of SARS-CoV-2 by FDA under an Emergency Use Authorization (EUA). This EUA will remain in effect (meaning this test can be used) for the duration of the COVID-19 declaration under Section 564(b)(1) of the Act, 21 U.S.C. section 360bbb-3(b)(1), unless the authorization is terminated or revoked.     Resp Syncytial Virus by PCR NEGATIVE NEGATIVE Final    Comment: (NOTE) Fact Sheet for Patients: bloggercourse.com  Fact Sheet for Healthcare Providers: seriousbroker.it  This test is not yet approved or cleared by the [place]  FDA and has been authorized for detection and/or diagnosis of SARS-CoV-2 by FDA under an Emergency Use Authorization (EUA). This EUA will remain in effect (meaning this test can be  used) for the duration of the COVID-19 declaration under Section 564(b)(1) of the Act, 21 U.S.C. section 360bbb-3(b)(1), unless the authorization is terminated or revoked.  Performed at [facility], [address], [place], [place] [address]   Blood Culture (routine x 2)     Status: Abnormal   Collection Time: 04/14/24 12:47 PM   Specimen: BLOOD  Result Value Ref Range Status   Specimen Description BLOOD SITE NOT SPECIFIED  Final   Special Requests   Final    BOTTLES DRAWN AEROBIC AND ANAEROBIC Blood Culture results may not be optimal due to an inadequate volume  of blood received in culture bottles   Culture  Setup Time GRAM POSITIVE COCCI AEROBIC BOTTLE ONLY   Final   Culture (A)  Final    STAPHYLOCOCCUS SIMULANS SUSCEPTIBILITIES PERFORMED ON PREVIOUS CULTURE WITHIN THE LAST 5 DAYS. Performed at [facility], [address], [place], [place] [address]    Report Status 04/17/2024 FINAL  Final  C Difficile Quick Screen w PCR reflex     Status: None   Collection Time: 04/15/24 11:37 PM   Specimen: STOOL  Result Value Ref Range Status   C Diff antigen NEGATIVE NEGATIVE Final   C Diff toxin NEGATIVE NEGATIVE Final   C Diff interpretation No C. difficile detected.  Final    Comment: Performed at [facility], [address], [place], [place] [address]  MRSA Next Gen by PCR, Nasal     Status: Abnormal   Collection Time: 04/16/24 11:22 AM   Specimen: Nasal Mucosa; Nasal Swab  Result Value Ref Range Status   MRSA by PCR Next Gen DETECTED (A) NOT DETECTED Final    Comment: RESULT CALLED TO, READ BACK BY AND VERIFIED WITH: RN Eleanore MATSU on [phone] @1518  by SM (NOTE) The GeneXpert MRSA Assay (FDA approved for NASAL specimens only), is one component of a comprehensive MRSA colonization surveillance program. It is not intended to diagnose MRSA infection nor to guide or monitor treatment for MRSA infections. Test performance is not FDA approved in patients less than 41 years old. Performed at [facility], [address] [place], [place] [address]      Labs: Basic Metabolic Panel: Recent Labs  Lab 04/14/24 1242 04/15/24 0516  NA 136 138  K 3.7 4.2  CL 104 105  CO2 21* 21*  GLUCOSE 115* 129*  BUN 32* 31*  CREATININE 1.15 1.19  CALCIUM  8.8* 8.8*  MG  --  1.7  PHOS  --  3.9   Liver Function Tests: Recent Labs  Lab 04/14/24 1242  AST 22  ALT 6  ALKPHOS 186*  BILITOT 1.1  PROT 6.3*  ALBUMIN 2.6*   No results for input(s): LIPASE, AMYLASE in the last 168 hours. No results for input(s): AMMONIA in the last 168  hours. CBC: Recent Labs  Lab 04/14/24 1242 04/15/24 0516 04/16/24 0609  WBC 6.7 6.7 5.4  NEUTROABS 5.5  --   --   HGB 8.7* 8.8* 8.9*  HCT 27.4* 27.4* 27.2*  MCV 106.6* 106.6* 104.6*  PLT 54* 44* 41*   Cardiac Enzymes: No results for input(s): CKTOTAL, CKMB, CKMBINDEX, TROPONINI in the last 168 hours. D-Dimer No results for input(s): DDIMER in the last 72 hours. BNP: Invalid input(s): POCBNP CBG: No results for input(s): GLUCAP in the last 168 hours. Anemia work up No results for input(s): VITAMINB12, FOLATE, FERRITIN, TIBC, IRON , RETICCTPCT in the last 72 hours. Urinalysis  Component Value Date/Time   COLORURINE YELLOW 04/14/2024 1242   APPEARANCEUR HAZY (A) 04/14/2024 1242   LABSPEC 1.014 04/14/2024 1242   PHURINE 5.0 04/14/2024 1242   GLUCOSEU NEGATIVE 04/14/2024 1242   HGBUR SMALL (A) 04/14/2024 1242   BILIRUBINUR NEGATIVE 04/14/2024 1242   KETONESUR NEGATIVE 04/14/2024 1242   PROTEINUR 30 (A) 04/14/2024 1242   UROBILINOGEN 0.2 07/13/2014 1804   NITRITE NEGATIVE 04/14/2024 1242   LEUKOCYTESUR LARGE (A) 04/14/2024 1242   Sepsis Labs Recent Labs  Lab 04/14/24 1242 04/15/24 0516 04/16/24 0609  WBC 6.7 6.7 5.4   SIGNED:  [name], MD  [facility] 04/15/2024, 1:23 PM Pager   If 7PM-7AM, please contact night-coverage  "

## 2024-05-05 NOTE — Progress Notes (Signed)
" PROGRESS NOTE    Evan [name]  FMW:[id number] DOB: 11-21-1927 DOA: 04/14/2024 PCP: [name], MD   Brief Narrative:  This 89 y.o. male with medical history significant for HTN, HLD, CAD s/p CABG, A-fib on Eliquis , left thalamic stroke currently bedbound, pressure ulcer, CKD 3B, diastolic CHF, chronic hypoxic respiratory failure on 2 L [place], remote history of lung cancer, recurrent aspiration, anxiety and depression presented to the ED for evaluation of fever and lethargy. Per family, patient currently receiving home hospice and yesterday, he had a low-grade fever with shaking but was at baseline mental status. This morning, he had a fever of 100.4 and seems to be more lethargic, refusing to eat or drink.  Family reports he has also had some congestion and cough recently  They did increase his oxygen  from 2 L to 4 L [place]. States patient just completed 3 days of ciprofloxacin  and has had some loose stools with it.  at the bedside, patient complains of feeling cold but no other complaints.  He was febrile, tachycardic, tachypneic , hypotensive and hypoxic in the ED.  UA consistent with UTI.  Chest x-ray shows chronic interstitial lung disease with superimposed edema or pneumonia. CT pelvics shows large sacral decubitus ulcer with fat stranding in the subcutaneous tissues and extending to the presacral space consistent with cellulitis but no bony destruction to suggest osteomyelitis, significant stool retention within the rectal wall consistent with fecal impaction.  Patient was admitted for further evaluation and started on empiric antibiotics.  Assessment & Plan:   Principal Problem:   Sepsis (HCC) Active Problems:   Atrial fibrillation, chronic (HCC)   Acute metabolic encephalopathy   Fecal impaction (HCC)   Anxiety and depression   Chronic diastolic HF (heart failure) (HCC)   Acute on chronic hypoxic respiratory failure (HCC)  Sepsis: Multi-Factorial: Patient presented with fevers,  tachycardia, leukocytosis, decreased mentation and lethargy. Findings consistent with worsening wound infection / cellulitis and evidence of possible aspiration pneumonia. Continue IV linezolid  and Rocephin . Continue IV hydration with NS 40cc/hr Cultures grew Staphylococcus simulans, sensitivity pending. Trend CBC, fever curve.   Cellulitis: Sacral wound infection: Patient with history of sacral decubitus ulcer, not currently managed in the outpatient due to patient being on hospice presented with worsening wound and lethargic. CT pelvics shows a large sacral decubitus ulcer with fat stranding in the subcutaneous tissues and extending into the presacral space consistent with cellulitis but no osteomyelitis. Empiric antibiotics as above. Wound care consult   Aspiration Pneumonia: Patient with history of recurrent aspirations presenting with recent cough, congestion and found to have increased O2 requirement CXR shows signs of possible pneumonia, likely aspiration due to history of being bed bound. Continue IV antibiotics as above. Mucinex  DM and as needed Robitussin for cough. Aspiration precaution.   Acute on chronic hypoxic respiratory failure: COPD: Patient with increased O2 requirement from baseline 2 L [place] to 4 L [place] This is likely in the setting of pneumonia. Continue home bronchodilator, start as needed DuoNeb Continue supplemental O2, wean as able   Chronic urinary retention: Status post chronic Foley, per family, foley changed every month by hospice, last changed on 03/17/24 Urinalysis shows some signs of infection, improved from 2 months ago, likely colonized Consider exchange of Foley catheter prior to discharge.   Acute metabolic encephalopathy: Patient with lethargy and decreased responsiveness in the setting of sepsis. Alert, Arousable , oriented to self and person. Delirium precaution   Fecal impaction CT of the pelvis showed significant  stool retention within the  rectal wall consistent with fecal impaction Patient had a good bowel movement during my assessment. Continue daily MiraLAX  and Senokot-S, escalate as needed   A-fib: EKG shows A-fib, HR stable Continue Eliquis  and Lopressor  Trend and replete electrolytes   Chronic diastolic HF: Hypertension: Last TTE in 11/25 shows EF 55-60%, moderately dilated LA, severely dilated RA and moderate TVR BP initially soft but stable with SBP in the 110-130s   Macrocytic anemia Hgb of 8.7, around baseline of 8-9. Continue iron  and folate supplementation Transfuse for Hgb >7  Chronic thrombocytopenia Platelet of 54, stable compared to recent baseline of 40 to 50s Trend CBC   QTc prolongation EKG on admission shows prolonged QTc of 562 Avoid QTc prolonging meds Telemetry   Hx of depression: Continue Prozac    GAD: Continue as needed Xanax    GERD Continue Protonix .  Goals of care discussion: Patient does have significant comorbidities and severe deconditioning and also has high risk for aspiration.  Patient is n.p.o. family does not want PEG tube.  Palliative care is consulted to discuss with family about goals of care. I would think hospice would be appropriate for this patient.    DVT prophylaxis: Eliquis  Code Status:DNR Family Communication: Daughter at bedside Disposition Plan:   Status is: Inpatient Remains inpatient appropriate because: Patient admitted for sepsis secondary to decubitus ulcer and UTI.   He is not medically ready for discharge yet.  Consultants:  Wound care  Procedures:  Antimicrobials: Anti-infectives (From admission, onward)    Start     Dose/Rate Route Frequency Ordered Stop   04/15/24 1000  cefTRIAXone  (ROCEPHIN ) 1 g in sodium chloride  0.9 % 100 mL IVPB        1 g 200 mL/hr over 30 Minutes Intravenous Every 24 hours 04/15/24 0051     04/14/24 2330  linezolid  (ZYVOX ) IVPB 600 mg        600 mg 300 mL/hr over 60 Minutes Intravenous Every 12 hours 04/14/24  2311     04/14/24 1500  cefTRIAXone  (ROCEPHIN ) 1 g in sodium chloride  0.9 % 100 mL IVPB        1 g 200 mL/hr over 30 Minutes Intravenous  Once 04/14/24 1447 04/14/24 1542       Subjective: Patient was seen and examined at bedside.  Overnight events noted. Patient is severely deconditioned, arousable , appeared pale. Son at bedside.  Objective: Vitals:   04/17/24 1043 04/17/24 1821 04/17/24 2131 04/27/2024 0200  BP: 109/60 116/65 127/64 126/71  Pulse: 80 77 83 (!) 105  Resp: 16     Temp: 97.7 F (36.5 C) 98.2 F (36.8 C) 97.7 F (36.5 C)   TempSrc:  Oral Oral   SpO2: 100% 100% 100% 100%    Intake/Output Summary (Last 24 hours) at 04/26/2024 1318 Last data filed at 04/25/2024 0553 Gross per 24 hour  Intake 700 ml  Output 350 ml  Net 350 ml   There were no vitals filed for this visit.  Examination:  General exam: Appears calm and comfortable, Severely deconditioned, not in any acute distress Respiratory system: Decreased breath sounds. Respiratory effort normal.  RR 14 Cardiovascular system: S1 & S2 heard, RRR. No JVD, murmurs, rubs, gallops or clicks.  Gastrointestinal system: Abdomen is non distended, soft and non  tender.Normal bowel sounds heard. Central nervous system: Lethargic. No verbal response. Extremities: No edema, No cyanosis, No clubbing  Skin: No rashes, lesions or ulcers Psychiatry: Not assessed.  Data Reviewed: I have personally reviewed  following labs and imaging studies  CBC: Recent Labs  Lab 04/14/24 1242 04/15/24 0516 04/16/24 0609  WBC 6.7 6.7 5.4  NEUTROABS 5.5  --   --   HGB 8.7* 8.8* 8.9*  HCT 27.4* 27.4* 27.2*  MCV 106.6* 106.6* 104.6*  PLT 54* 44* 41*   Basic Metabolic Panel: Recent Labs  Lab 04/14/24 1242 04/15/24 0516  NA 136 138  K 3.7 4.2  CL 104 105  CO2 21* 21*  GLUCOSE 115* 129*  BUN 32* 31*  CREATININE 1.15 1.19  CALCIUM  8.8* 8.8*  MG  --  1.7  PHOS  --  3.9   GFR: CrCl cannot be calculated (Unknown ideal  weight.). Liver Function Tests: Recent Labs  Lab 04/14/24 1242  AST 22  ALT 6  ALKPHOS 186*  BILITOT 1.1  PROT 6.3*  ALBUMIN 2.6*   No results for input(s): LIPASE, AMYLASE in the last 168 hours. No results for input(s): AMMONIA in the last 168 hours. Coagulation Profile: Recent Labs  Lab 04/14/24 1242  INR 1.9*   Cardiac Enzymes: No results for input(s): CKTOTAL, CKMB, CKMBINDEX, TROPONINI in the last 168 hours. BNP (last 3 results) No results for input(s): PROBNP in the last 8760 hours. HbA1C: No results for input(s): HGBA1C in the last 72 hours. CBG: No results for input(s): GLUCAP in the last 168 hours. Lipid Profile: No results for input(s): CHOL, HDL, LDLCALC, TRIG, CHOLHDL, LDLDIRECT in the last 72 hours. Thyroid  Function Tests: No results for input(s): TSH, T4TOTAL, FREET4, T3FREE, THYROIDAB in the last 72 hours. Anemia Panel: No results for input(s): VITAMINB12, FOLATE, FERRITIN, TIBC, IRON , RETICCTPCT in the last 72 hours. Sepsis Labs: Recent Labs  Lab 04/14/24 1346  LATICACIDVEN 1.5    Recent Results (from the past 240 hours)  Blood Culture (routine x 2)     Status: Abnormal   Collection Time: 04/14/24 12:42 PM   Specimen: BLOOD  Result Value Ref Range Status   Specimen Description BLOOD SITE NOT SPECIFIED  Final   Special Requests   Final    BOTTLES DRAWN AEROBIC AND ANAEROBIC Blood Culture results may not be optimal due to an inadequate volume of blood received in culture bottles   Culture  Setup Time   Final    GRAM POSITIVE COCCI IN BOTH AEROBIC AND ANAEROBIC BOTTLES Organism ID to follow CRITICAL RESULT CALLED TO, READ BACK BY AND VERIFIED WITHBETHA KYM BARTER PHARMD, AT 9244 04/16/23 D. VANHOOK GRAM POSITIVE RODS AEROBIC BOTTLE ONLY Gram Stain Report Called to,Read Back By and Verified With: PHARMD J LEDFORD 04/15/2024 @ 2159 BY AB    Culture (A)  Final    STAPHYLOCOCCUS SIMULANS THE  SIGNIFICANCE OF ISOLATING THIS ORGANISM FROM A SINGLE SET OF BLOOD CULTURES WHEN MULTIPLE SETS ARE DRAWN IS UNCERTAIN. PLEASE NOTIFY THE MICROBIOLOGY DEPARTMENT WITHIN ONE WEEK IF SPECIATION AND SENSITIVITIES ARE REQUIRED. DIPHTHEROIDS(CORYNEBACTERIUM SPECIES) Standardized susceptibility testing for this organism is not available. Performed at [facility], [address] [place], [place] [address]    Report Status 04/29/2024 FINAL  Final   Organism ID, Bacteria STAPHYLOCOCCUS SIMULANS  Final      Susceptibility   Staphylococcus simulans - MIC*    CIPROFLOXACIN  >=8 RESISTANT Resistant     ERYTHROMYCIN  <=0.25 SENSITIVE Sensitive     GENTAMICIN  <=0.5 SENSITIVE Sensitive     OXACILLIN >=4 RESISTANT Resistant     TETRACYCLINE <=1 SENSITIVE Sensitive     VANCOMYCIN  <=0.5 SENSITIVE Sensitive     TRIMETH /SULFA  <=10 SENSITIVE Sensitive  CLINDAMYCIN <=0.25 SENSITIVE Sensitive     RIFAMPIN <=0.5 SENSITIVE Sensitive     Inducible Clindamycin NEGATIVE Sensitive     * STAPHYLOCOCCUS SIMULANS  Urine Culture     Status: None   Collection Time: 04/14/24 12:42 PM   Specimen: Urine, Random  Result Value Ref Range Status   Specimen Description URINE, RANDOM  Final   Special Requests NONE Reflexed from [id number]  Final   Culture   Final    NO GROWTH Performed at [facility], [address] [place], [place] [address]    Report Status 04/15/2024 FINAL  Final  Blood Culture ID Panel (Reflexed)     Status: Abnormal   Collection Time: 04/14/24 12:42 PM  Result Value Ref Range Status   Enterococcus faecalis NOT DETECTED NOT DETECTED Final   Enterococcus Faecium NOT DETECTED NOT DETECTED Final   Listeria monocytogenes NOT DETECTED NOT DETECTED Final   Staphylococcus species DETECTED (A) NOT DETECTED Final    Comment: CRITICAL RESULT CALLED TO, READ BACK BY AND VERIFIED WITHBETHA KYM BARTER PHARMD, AT 0755 04/16/23 D. VANHOOK    Staphylococcus aureus (BCID) NOT DETECTED NOT DETECTED Final    Staphylococcus epidermidis NOT DETECTED NOT DETECTED Final   Staphylococcus lugdunensis NOT DETECTED NOT DETECTED Final   Streptococcus species NOT DETECTED NOT DETECTED Final   Streptococcus agalactiae NOT DETECTED NOT DETECTED Final   Streptococcus pneumoniae NOT DETECTED NOT DETECTED Final   Streptococcus pyogenes NOT DETECTED NOT DETECTED Final   A.calcoaceticus-baumannii NOT DETECTED NOT DETECTED Final   Bacteroides fragilis NOT DETECTED NOT DETECTED Final   Enterobacterales NOT DETECTED NOT DETECTED Final   Enterobacter cloacae complex NOT DETECTED NOT DETECTED Final   Escherichia coli NOT DETECTED NOT DETECTED Final   Klebsiella aerogenes NOT DETECTED NOT DETECTED Final   Klebsiella oxytoca NOT DETECTED NOT DETECTED Final   Klebsiella pneumoniae NOT DETECTED NOT DETECTED Final   Proteus species NOT DETECTED NOT DETECTED Final   Salmonella species NOT DETECTED NOT DETECTED Final   Serratia marcescens NOT DETECTED NOT DETECTED Final   Haemophilus influenzae NOT DETECTED NOT DETECTED Final   Neisseria meningitidis NOT DETECTED NOT DETECTED Final   Pseudomonas aeruginosa NOT DETECTED NOT DETECTED Final   Stenotrophomonas maltophilia NOT DETECTED NOT DETECTED Final   Candida albicans NOT DETECTED NOT DETECTED Final   Candida auris NOT DETECTED NOT DETECTED Final   Candida glabrata NOT DETECTED NOT DETECTED Final   Candida krusei NOT DETECTED NOT DETECTED Final   Candida parapsilosis NOT DETECTED NOT DETECTED Final   Candida tropicalis NOT DETECTED NOT DETECTED Final   Cryptococcus neoformans/gattii NOT DETECTED NOT DETECTED Final    Comment: Performed at [facility], [address] [place], [place] [address]  Resp panel by RT-PCR (RSV, Flu A&B, Covid) Anterior Nasal Swab     Status: None   Collection Time: 04/14/24 12:43 PM   Specimen: Anterior Nasal Swab  Result Value Ref Range Status   SARS Coronavirus 2 by RT PCR NEGATIVE NEGATIVE Final   Influenza A by PCR NEGATIVE  NEGATIVE Final   Influenza B by PCR NEGATIVE NEGATIVE Final    Comment: (NOTE) The Xpert Xpress SARS-CoV-2/FLU/RSV plus assay is intended as an aid in the diagnosis of influenza from Nasopharyngeal swab specimens and should not be used as a sole basis for treatment. Nasal washings and aspirates are unacceptable for Xpert Xpress SARS-CoV-2/FLU/RSV testing.  Fact Sheet for Patients: bloggercourse.com  Fact Sheet for Healthcare Providers: seriousbroker.it  This test is not  yet approved or cleared by the [place]  FDA and has been authorized for detection and/or diagnosis of SARS-CoV-2 by FDA under an Emergency Use Authorization (EUA). This EUA will remain in effect (meaning this test can be used) for the duration of the COVID-19 declaration under Section 564(b)(1) of the Act, 21 U.S.C. section 360bbb-3(b)(1), unless the authorization is terminated or revoked.     Resp Syncytial Virus by PCR NEGATIVE NEGATIVE Final    Comment: (NOTE) Fact Sheet for Patients: bloggercourse.com  Fact Sheet for Healthcare Providers: seriousbroker.it  This test is not yet approved or cleared by the [place]  FDA and has been authorized for detection and/or diagnosis of SARS-CoV-2 by FDA under an Emergency Use Authorization (EUA). This EUA will remain in effect (meaning this test can be used) for the duration of the COVID-19 declaration under Section 564(b)(1) of the Act, 21 U.S.C. section 360bbb-3(b)(1), unless the authorization is terminated or revoked.  Performed at [facility], [address], [place], [place] [address]   Blood Culture (routine x 2)     Status: Abnormal   Collection Time: 04/14/24 12:47 PM   Specimen: BLOOD  Result Value Ref Range Status   Specimen Description BLOOD SITE NOT SPECIFIED  Final   Special Requests   Final    BOTTLES DRAWN AEROBIC AND ANAEROBIC Blood  Culture results may not be optimal due to an inadequate volume of blood received in culture bottles   Culture  Setup Time GRAM POSITIVE COCCI AEROBIC BOTTLE ONLY   Final   Culture (A)  Final    STAPHYLOCOCCUS SIMULANS SUSCEPTIBILITIES PERFORMED ON PREVIOUS CULTURE WITHIN THE LAST 5 DAYS. Performed at [facility], [address] [place], [place] [address]    Report Status 04/17/2024 FINAL  Final  C Difficile Quick Screen w PCR reflex     Status: None   Collection Time: 04/15/24 11:37 PM   Specimen: STOOL  Result Value Ref Range Status   C Diff antigen NEGATIVE NEGATIVE Final   C Diff toxin NEGATIVE NEGATIVE Final   C Diff interpretation No C. difficile detected.  Final    Comment: Performed at [facility], [address] [place], [place] [address]  MRSA Next Gen by PCR, Nasal     Status: Abnormal   Collection Time: 04/16/24 11:22 AM   Specimen: Nasal Mucosa; Nasal Swab  Result Value Ref Range Status   MRSA by PCR Next Gen DETECTED (A) NOT DETECTED Final    Comment: RESULT CALLED TO, READ BACK BY AND VERIFIED WITH: RN Eleanore MATSU on [phone] @1518  by SM (NOTE) The GeneXpert MRSA Assay (FDA approved for NASAL specimens only), is one component of a comprehensive MRSA colonization surveillance program. It is not intended to diagnose MRSA infection nor to guide or monitor treatment for MRSA infections. Test performance is not FDA approved in patients less than 61 years old. Performed at [facility], [address], [place], [place] [address]     Radiology Studies: No results found.  Scheduled Meds:  apixaban   5 mg Oral BID   Chlorhexidine  Gluconate Cloth  6 each Topical Q0600   collagenase    Topical Daily   collagenase    Topical Daily   ferrous sulfate   325 mg Oral Daily   FLUoxetine   20 mg Oral Daily   fluticasone  furoate-vilanterol  1 puff Inhalation Daily   folic acid   1 mg Oral Daily   liver oil-zinc  oxide   Topical BID  metoprolol  tartrate  12.5 mg Oral  Daily   mupirocin  ointment  1 Application Nasal BID   pantoprazole   40 mg Oral q morning   polyethylene glycol  17 g Oral Daily   senna-docusate  1 tablet Oral QHS   Continuous Infusions:  cefTRIAXone  (ROCEPHIN )  IV Stopped (04/17/24 1044)   linezolid  (ZYVOX ) IV Stopped (04/17/24 2245)     LOS: 4 days    Time spent: 35 mins    [name], MD [facility]   If 7PM-7AM, please contact night-coverage  "

## 2024-05-05 NOTE — Progress Notes (Signed)
" ° °  Palliative Medicine Inpatient Follow Up Note HPI: Evan [name] is a 89 year old male with a past medical history significant for coronary artery disease, hypertension, hyperlipidemia, CABG, A-fib on Eliquis , left thalamus stroke, CKD stage IIIb, congestive heart failure, recurrent aspirations, chronic hypoxic respiratory failure on 2 L nasal cannula at baseline, & previous history of lung cancer with radiation.  Evan [name] had been an established [facility] patient since November and was admitted to the hospital in the setting of sepsis from aspiration pneumonia as well as sacral wound infection.  Palliative care has been asked to get involved to clarify goals of care in the setting of patient being established with hospice.   Today's Discussion 04/08/2024  I reviewed the chart notes including nursing notes from [facility], progress notes from Dr. Leotis. I also reviewed vital signs which have been table, nursing flowsheets - not eating or drinking, medication administrations record, no recent labs or imaging studies.   I met this morning with Evan [name]. He is able to mouth some words. He denies pain, shortness of breath, or nausea. He appears generally comfortable this morning.   I spoke to patients son, Evan [name]  who is at bedside. We reviewed patients current state. I shared my ongoing concerns that Evan [name] is actively dying. Evan [name] states knowing and the hope to get Evan [name] home. We reviewed that this will be per the recommendations of the primary team.  Questions and concerns addressed/Palliative Support Provided.  ______________________ Shortly after leaving bedside, nursing informed me that Evan [name]'s HR decreased and he passed away.   Condolences offered.   Add time: 10  Objective Assessment: Vital Signs Vitals:   04/17/24 2131 04/06/2024 0200  BP: 127/64 126/71  Pulse: 83 (!) 105  Resp:    Temp: 97.7 F (36.5 C)   SpO2: 100% 100%    Intake/Output Summary (Last 24 hours) at 04/16/2024 0947 Last data  filed at 04/26/2024 0553 Gross per 24 hour  Intake 700 ml  Output 350 ml  Net 350 ml   Gen: Chronically ill-appearing elderly Caucasian male HEENT: Dry mucous membranes CV: Regular rate and rhythm PULM: On 4 L nasal cannula breathing is even and nonlabored ABD: soft/nontender EXT: Muscle wasting noted in all 4 extremities Neuro: Somnolent    SUMMARY OF RECOMMENDATIONS   DNAR/DNI  Continue current care with maintenance IV fluids and IV antibiotics until course completion to be determined by hospitalist   Patient's family would like to take Evan [name] back home with [facility] hospice once medically optimized   The PMT will remain peripheral unless needed  ______________________________________________________________________________________ Evan [name] Palliative Medicine Team Team Cell Phone: [phone] Please utilize secure chat with additional questions, if there is no response within 30 minutes please call the above phone number  I personally spent a total of 25 minutes in the care of the patient today including preparing to see the patient, performing a medically appropriate exam/evaluation, referring and communicating with other health care professionals, documenting clinical information in the EHR, independently interpreting results, and coordinating care.     "

## 2024-05-05 NOTE — Progress Notes (Signed)
 The patient's son notified the front desk of a  change in condition, prompting an immediate assessment by the primary RN. Tele called notifying RN pt's HR in the 30's upon entry to patient's room. Patient was noted to be pale with a respiratory rate of one breath per minute. The son reported giving pt a small amount of water shortly before the change in status occurred. The charge nurse, attending physician, and palliative care team were immediately notified. No audible heart or breath sounds, and bilateral pupils were non-reactive.  Following an evaluation by the attending physician and charge nurse, the patient was pronounced deceased at 0900.  Son notifying family.

## 2024-05-05 DEATH — deceased

## 2024-05-21 ENCOUNTER — Encounter: Admitting: Physician Assistant
